# Patient Record
Sex: Female | Born: 1955 | Race: Black or African American | Hispanic: No | Smoking: Current every day smoker
Health system: Southern US, Community
[De-identification: ages and names within clinical notes are randomized; demographics above are authoritative.]

## PROBLEM LIST (undated history)

## (undated) DIAGNOSIS — K5909 Other constipation: Secondary | ICD-10-CM

## (undated) DIAGNOSIS — F419 Anxiety disorder, unspecified: Secondary | ICD-10-CM

## (undated) DIAGNOSIS — Z992 Dependence on renal dialysis: Secondary | ICD-10-CM

## (undated) DIAGNOSIS — E119 Type 2 diabetes mellitus without complications: Secondary | ICD-10-CM

## (undated) DIAGNOSIS — I739 Peripheral vascular disease, unspecified: Secondary | ICD-10-CM

## (undated) DIAGNOSIS — M1990 Unspecified osteoarthritis, unspecified site: Secondary | ICD-10-CM

## (undated) DIAGNOSIS — J449 Chronic obstructive pulmonary disease, unspecified: Secondary | ICD-10-CM

## (undated) DIAGNOSIS — Z952 Presence of prosthetic heart valve: Secondary | ICD-10-CM

## (undated) DIAGNOSIS — M549 Dorsalgia, unspecified: Secondary | ICD-10-CM

## (undated) DIAGNOSIS — Z9289 Personal history of other medical treatment: Secondary | ICD-10-CM

## (undated) DIAGNOSIS — J189 Pneumonia, unspecified organism: Secondary | ICD-10-CM

## (undated) DIAGNOSIS — F329 Major depressive disorder, single episode, unspecified: Secondary | ICD-10-CM

## (undated) DIAGNOSIS — I509 Heart failure, unspecified: Secondary | ICD-10-CM

## (undated) DIAGNOSIS — I219 Acute myocardial infarction, unspecified: Secondary | ICD-10-CM

## (undated) DIAGNOSIS — G43909 Migraine, unspecified, not intractable, without status migrainosus: Secondary | ICD-10-CM

## (undated) DIAGNOSIS — Z89512 Acquired absence of left leg below knee: Secondary | ICD-10-CM

## (undated) DIAGNOSIS — K219 Gastro-esophageal reflux disease without esophagitis: Secondary | ICD-10-CM

## (undated) DIAGNOSIS — Z720 Tobacco use: Secondary | ICD-10-CM

## (undated) DIAGNOSIS — I10 Essential (primary) hypertension: Secondary | ICD-10-CM

## (undated) DIAGNOSIS — I2510 Atherosclerotic heart disease of native coronary artery without angina pectoris: Secondary | ICD-10-CM

## (undated) DIAGNOSIS — N186 End stage renal disease: Secondary | ICD-10-CM

## (undated) DIAGNOSIS — B1920 Unspecified viral hepatitis C without hepatic coma: Secondary | ICD-10-CM

## (undated) DIAGNOSIS — Z944 Liver transplant status: Secondary | ICD-10-CM

## (undated) DIAGNOSIS — F32A Depression, unspecified: Secondary | ICD-10-CM

## (undated) DIAGNOSIS — G8929 Other chronic pain: Secondary | ICD-10-CM

## (undated) DIAGNOSIS — Z89511 Acquired absence of right leg below knee: Secondary | ICD-10-CM

## (undated) DIAGNOSIS — D649 Anemia, unspecified: Secondary | ICD-10-CM

## (undated) DIAGNOSIS — E039 Hypothyroidism, unspecified: Secondary | ICD-10-CM

## (undated) DIAGNOSIS — I471 Supraventricular tachycardia: Secondary | ICD-10-CM

## (undated) DIAGNOSIS — G709 Myoneural disorder, unspecified: Secondary | ICD-10-CM

## (undated) HISTORY — DX: Anemia, unspecified: D64.9

## (undated) HISTORY — DX: Depression, unspecified: F32.A

## (undated) HISTORY — DX: Other chronic pain: G89.29

## (undated) HISTORY — PX: SMALL INTESTINE SURGERY: SHX150

## (undated) HISTORY — PX: CYSTOSCOPY: SUR368

## (undated) HISTORY — PX: THROMBECTOMY: PRO61

## (undated) HISTORY — DX: Gastro-esophageal reflux disease without esophagitis: K21.9

## (undated) HISTORY — PX: TUBAL LIGATION: SHX77

## (undated) HISTORY — DX: Unspecified viral hepatitis C without hepatic coma: B19.20

## (undated) HISTORY — DX: Acute myocardial infarction, unspecified: I21.9

## (undated) HISTORY — DX: Major depressive disorder, single episode, unspecified: F32.9

## (undated) HISTORY — DX: Dorsalgia, unspecified: M54.9

## (undated) HISTORY — PX: OTHER SURGICAL HISTORY: SHX169

## (undated) HISTORY — DX: Tobacco use: Z72.0

## (undated) HISTORY — DX: Atherosclerotic heart disease of native coronary artery without angina pectoris: I25.10

## (undated) HISTORY — DX: Essential (primary) hypertension: I10

---

## 1991-02-11 HISTORY — PX: CHOLECYSTECTOMY: SHX55

## 2004-06-07 ENCOUNTER — Ambulatory Visit: Payer: Self-pay | Admitting: Endocrinology

## 2004-06-14 ENCOUNTER — Ambulatory Visit: Payer: Self-pay | Admitting: Endocrinology

## 2004-06-18 ENCOUNTER — Ambulatory Visit: Payer: Self-pay | Admitting: Endocrinology

## 2004-06-28 ENCOUNTER — Ambulatory Visit: Payer: Self-pay | Admitting: Internal Medicine

## 2004-11-13 ENCOUNTER — Ambulatory Visit: Payer: Self-pay | Admitting: Endocrinology

## 2005-05-29 ENCOUNTER — Ambulatory Visit: Payer: Self-pay | Admitting: Endocrinology

## 2005-09-25 ENCOUNTER — Ambulatory Visit: Payer: Self-pay | Admitting: Endocrinology

## 2005-12-30 ENCOUNTER — Ambulatory Visit: Payer: Self-pay | Admitting: Endocrinology

## 2006-02-19 ENCOUNTER — Ambulatory Visit: Payer: Self-pay | Admitting: Endocrinology

## 2006-02-19 LAB — CONVERTED CEMR LAB
Hgb A1c MFr Bld: 8.2 % — ABNORMAL HIGH (ref 4.6–6.0)
TSH: 42.28 microintl units/mL — ABNORMAL HIGH (ref 0.35–5.50)

## 2006-05-28 ENCOUNTER — Ambulatory Visit: Payer: Self-pay | Admitting: Endocrinology

## 2006-06-25 ENCOUNTER — Encounter: Payer: Self-pay | Admitting: Endocrinology

## 2006-06-25 DIAGNOSIS — D631 Anemia in chronic kidney disease: Secondary | ICD-10-CM

## 2006-06-25 DIAGNOSIS — N189 Chronic kidney disease, unspecified: Secondary | ICD-10-CM

## 2006-06-25 DIAGNOSIS — E1122 Type 2 diabetes mellitus with diabetic chronic kidney disease: Secondary | ICD-10-CM

## 2006-06-25 DIAGNOSIS — K219 Gastro-esophageal reflux disease without esophagitis: Secondary | ICD-10-CM

## 2006-06-25 DIAGNOSIS — I119 Hypertensive heart disease without heart failure: Secondary | ICD-10-CM

## 2006-06-25 DIAGNOSIS — E039 Hypothyroidism, unspecified: Secondary | ICD-10-CM

## 2006-06-26 ENCOUNTER — Ambulatory Visit: Payer: Self-pay | Admitting: Endocrinology

## 2006-07-30 ENCOUNTER — Ambulatory Visit: Payer: Self-pay | Admitting: Endocrinology

## 2006-09-07 ENCOUNTER — Encounter: Payer: Self-pay | Admitting: Endocrinology

## 2006-11-18 ENCOUNTER — Encounter: Payer: Self-pay | Admitting: Endocrinology

## 2006-11-18 ENCOUNTER — Ambulatory Visit: Payer: Self-pay | Admitting: Endocrinology

## 2007-08-16 ENCOUNTER — Ambulatory Visit: Payer: Self-pay | Admitting: Endocrinology

## 2007-09-16 ENCOUNTER — Ambulatory Visit: Payer: Self-pay | Admitting: Endocrinology

## 2007-09-21 ENCOUNTER — Telehealth (INDEPENDENT_AMBULATORY_CARE_PROVIDER_SITE_OTHER): Payer: Self-pay | Admitting: *Deleted

## 2007-10-28 ENCOUNTER — Ambulatory Visit: Payer: Self-pay | Admitting: Endocrinology

## 2007-12-02 ENCOUNTER — Ambulatory Visit: Payer: Self-pay | Admitting: Endocrinology

## 2007-12-23 ENCOUNTER — Ambulatory Visit: Payer: Self-pay | Admitting: Endocrinology

## 2008-02-01 ENCOUNTER — Encounter: Payer: Self-pay | Admitting: Internal Medicine

## 2008-02-11 HISTORY — PX: SPINAL GROWTH RODS: SHX2427

## 2008-03-02 ENCOUNTER — Telehealth: Payer: Self-pay | Admitting: Endocrinology

## 2008-03-08 ENCOUNTER — Ambulatory Visit: Payer: Self-pay | Admitting: Internal Medicine

## 2008-10-03 ENCOUNTER — Telehealth: Payer: Self-pay | Admitting: Endocrinology

## 2008-10-19 ENCOUNTER — Inpatient Hospital Stay (HOSPITAL_COMMUNITY): Admission: RE | Admit: 2008-10-19 | Discharge: 2008-10-24 | Payer: Self-pay | Admitting: Neurosurgery

## 2009-10-25 HISTORY — PX: LIVER TRANSPLANT: SHX410

## 2009-12-21 ENCOUNTER — Emergency Department (HOSPITAL_COMMUNITY): Admission: EM | Admit: 2009-12-21 | Discharge: 2009-12-22 | Payer: Self-pay | Admitting: Emergency Medicine

## 2010-02-25 ENCOUNTER — Ambulatory Visit: Admit: 2010-02-25 | Payer: Self-pay | Admitting: Surgery

## 2010-02-25 ENCOUNTER — Ambulatory Visit
Admission: RE | Admit: 2010-02-25 | Discharge: 2010-02-25 | Payer: Self-pay | Source: Home / Self Care | Attending: Surgery | Admitting: Surgery

## 2010-03-05 NOTE — Procedures (Unsigned)
CEPHALIC VEIN MAPPING  INDICATION:  Preop for AV fistula placement, per VVS standing order.  HISTORY: Acute renal failure.  EXAM: The right cephalic vein is compressible with diameter measurements ranging from 0.10 to 0.18 cm.  The right basilic vein is compressible with diameter measurements ranging from 0.24 to 0.27 cm.  The left cephalic vein is not compressible at the antecubital fossa level with diameter measurements ranging from 0.11 to 0.34 cm.  The left basilic vein is not compressible at the distal brachium and antecubital fossa levels with diameter measurements ranging from 0.27 to 0.31 cm.  See attached worksheet for all measurements.  IMPRESSION:  Bilateral cephalic and basilic vein measurements as described above.  ___________________________________________ V. Charlena Cross, MD  CH/MEDQ  D:  02/25/2010  T:  02/25/2010  Job:  [id number]

## 2010-03-13 ENCOUNTER — Other Ambulatory Visit (HOSPITAL_COMMUNITY): Payer: PRIVATE HEALTH INSURANCE

## 2010-03-15 ENCOUNTER — Ambulatory Visit (HOSPITAL_COMMUNITY)
Admission: RE | Admit: 2010-03-15 | Discharge: 2010-03-15 | Disposition: A | Discharge status: Home / Self Care | Payer: PRIVATE HEALTH INSURANCE | Attending: Surgery | Admitting: Surgery

## 2010-03-15 ENCOUNTER — Encounter (HOSPITAL_COMMUNITY): Payer: PRIVATE HEALTH INSURANCE

## 2010-03-15 DIAGNOSIS — N186 End stage renal disease: Secondary | ICD-10-CM

## 2010-03-15 DIAGNOSIS — N189 Chronic kidney disease, unspecified: Secondary | ICD-10-CM | POA: Insufficient documentation

## 2010-03-15 DIAGNOSIS — I120 Hypertensive chronic kidney disease with stage 5 chronic kidney disease or end stage renal disease: Secondary | ICD-10-CM

## 2010-03-15 LAB — POCT I-STAT 4, (NA,K, GLUC, HGB,HCT)
Glucose, Bld: 138 mg/dL — ABNORMAL HIGH (ref 70–99)
Potassium: 4.1 mEq/L (ref 3.5–5.1)

## 2010-03-24 NOTE — Op Note (Addendum)
[name], [name]                ACCOUNT NO.:  [id number]  MEDICAL RECORD NO.:  [id number]           PATIENT TYPE:  O  LOCATION:  SDSC                         FACILITY:  [facility]  PHYSICIAN:  Juleen China IV, MDDATE OF BIRTH:  1955-09-22  DATE OF PROCEDURE:  03/15/2010 DATE OF DISCHARGE:  03/15/2010                              OPERATIVE REPORT   PREOPERATIVE DIAGNOSIS:  Chronic renal insufficiency.  POSTOPERATIVE DIAGNOSIS:  Chronic renal insufficiency.  PROCEDURE PERFORMED:  Left forearm arteriovenous Gore-Tex graft.  SURGEON: 1. Durene Cal IV, MD  ANESTHESIA:  General.  FINDINGS:  Vein is lateral, 6 mm Gore-Tex graft, end-to-side venous anastomosis.  BLOOD LOSS:  Minimal.  COMPLICATIONS:  None.  PROCEDURE:  The patient was identified in the holding area and taken to room 6, placed supine on the table.  General LMA anesthesia was administered.  The patient was prepped and draped in usual fashion. Time-out was called.  Antibiotics given.  A transverse incision was made at the antecubital crease.  Cautery was used to divided the subcutaneous tissue.  The fascia was divided with cautery.  Sharp dissection was then used to isolate the brachial artery and the brachial and parabrachial veins.  Brachial artery was disease-free measuring approximately 2.5 mm. Similarly, the brachial vein was approximately 2.5 mm.  They were dissected out and encircled with vessel loops.  Next, a counter incision was made in the distal forearm.  A curved tunneler was used to create a subcutaneous tunnel through which a 6-mm stretch Gore-Tex graft was brought.  At this point in time, the patient was given systemic heparinization.  After heparin circulated, the artery was occluded with serrefine clamps.  An #11 blade was used to make an arteriotomy, which was extended longitudinally with Potts scissors.  The graft was beveled to fit the size of the arteriotomy and a running anastomosis was  created with 6-0 Prolene prior to completion.  The artery was flushed appropriately.  Clamps were released.  There was excellent pulsatile flow through the graft.  The graft was then flushed with heparin, saline, and reoccluded.  Next, the vein was occluded.  An #11 blade was used to make a venotomy, which was extended longitudinally with Potts scissors.  The graft was cut to the appropriate length and side anastomosis was created with 6-0 Prolene.  Prior to completion, the graft and vein were appropriately flushed.  Anastomosis was completed. There is a good thrill in the graft.  At this point in time, the patient had a monophasic radial signal and multiphasic ulnar signal, neither was changed significantly with graft compression.  The patient's heparin was reversed with protamine.  Once I was satisfied with hemostasis, the counter incision in the distal forearm was closed with 3- 0 Vicryl and Dermabond.  The antecubital incision was closed in two layers of 3-0 Vicryl.  Dermabond was placed on the wound.  The patient tolerated the procedure well without complications.     Jorge Ny, MD     VWB/MEDQ  D:  03/15/2010  T:  03/15/2010  Job:  [id number]  Electronically Signed by V.  Myra Gianotti IV MD on 03/24/2010 09:59:44 PM

## 2010-04-02 NOTE — Consult Note (Signed)
[name]                ACCOUNT NO.:  [id number]  MEDICAL RECORD NO.:  [id number]          PATIENT TYPE:  EMS  LOCATION:  MAJO                         FACILITY:  [facility]  PHYSICIAN:  Lucile Crater, MD         DATE OF BIRTH:  Jun 05, 1955  DATE OF CONSULTATION:  12/21/2009 DATE OF DISCHARGE:                                CONSULTATION   PRIMARY CARE PHYSICIAN:  [facility].  CHIEF COMPLAINT:  Nausea and vomiting.  HISTORY OF PRESENT ILLNESS:  The patient is a 55 year old female with a history of hepatitis C and cirrhosis of the liver, for which she underwent a liver transplant on 10/25/2009 at [facility].  The patient has been put on immunosuppressants post transplant.  She is on Prograf, Myfortic, and prednisone.  The patient has had problems with nausea and vomiting even before the transplant, but she reports acute worsening of the symptoms in the past 4 days.  She takes meals by mouth. She also has a feeding tube, and she gets tube feeds.  The patient states that she has not been eating anything in the past for 4 or 5 days because she throws up every time she ingests something.  She was recently admitted to [facility] with similar symptoms. She was found to have Clostridium difficile colitis, and she was treated with vancomycin.  The treatment was completed on 12/08/2009.  She denies having any chest pain but reports shortness of breath for the past week or so.  She denies having any fever or chills, but she reports diffuse abdominal pain.  The last bowel movement was earlier this afternoon. She had her last dialysis 1 day ago.  She claims to be compliant with all of her medications.  As a part of her initial workup, she had a CT scan of the abdomen and pelvis.  This visualized the lower parts of the lungs, and this revealed a moderate right pleural effusion with collapse or consolidation of the right lower lobe.  She also had tiny gas locules in the  subcutaneous at the cranial margin of the incision.  This was presumed to be abnormal, and they could not rule out an underlying infection.  REVIEW OF SYSTEMS:  A complete Review of Systems was done which included general, head, eyes, ears, nose, throat, cardiovascular, respiratory, GI, GU, endocrine, musculoskeletal, skin, and psychiatric -- all within normal except as mentioned in the History of Present Illness.  PAST MEDICAL HISTORY: 1. Hepatitis C. 2. Cirrhosis and liver failure secondary to #1. 3. Status post liver transplant on 10/25/2009 at [facility] [place]. 4. End-stage renal disease on hemodialysis. 5. Major depressive disorder. 6. Gastroesophageal reflux disease. 7. Hypothyroidism. 8. Hypertension. 9. Clostridium difficile colitis. 10.Diabetes mellitus.  ALLERGIES:  None.  CURRENT MEDICATIONS: 1. Prograf 8 mg b.i.d. 2. Myfortic 180 mg, two capsules p.o. b.i.d. 3. Prednisone 5 mg p.o. q. daily. 4. Bactrim SS 400/80, one tablet every Monday, Wednesday, and Friday;     last dose 04/25/2010. 5. Valganciclovir 450 mg, one tablet on Thursday and Saturday after  dialysis; last dose 01/24/2010. 6. Aspirin Entocort 81 mg once a day. 7. Nexium 40 mg p.o. q.p.m. 8. Oxycodone 5 mg, one to two tablets p.o. q.4h. p.r.n. 9. Colace 100 mg, one to two capsules daily as needed p.r.n.     constipation. 10.Senna 8.6 mg, two tablets by mouth once daily p.r.n. constipation. 11.Zoloft 100 mg p.o. q.p.m. 12.Lopressor 50 mg, two tablets p.o. b.i.d. 13.Norvasc 10 mg p.o. q.p.m. 14.Hydralazine 100 mg, 1-1/2 tablets p.o. b.i.d. 15.Zofran 4 mg, one tablet on the tongue q.4h. as needed for nausea,     vomiting. 16.Phenergan 12.5 mg, one tablet q.a.c. t.i.d. p.r.n. nausea,     vomiting. 17.Synthroid 200 mcg, one tablet p.o. q.a.m. before breakfast. 18.Lantus 10 units subcutaneously q.p.m. 19.NovoLog sliding scale before meals. 20.Simethicone 80 mg, one  tablet q.8h. as needed. 21.Tums 500 mg, one tablet by mouth q.6h. as needed for heartburn. 22.Rena-Vite one tablet p.o. q.a.m. 23.Folic acid 1 mg, one tablet p.o. q.a.m.  SOCIAL HISTORY:  There is no history of alcohol or illicit drugs.  There is a history of tobacco abuse.  FAMILY HISTORY:  Noncontributory.  PHYSICAL EXAMINATION:  VITALS:  T-max 98.7, pulse rate 80, blood pressure 177/84, respiratory 18.  O2 sat is 100% on 2 liters. GENERAL APPEARANCE:  Not in acute distress; alert and oriented x3, afebrile. HEENT:  Normocephalic, atraumatic.  The pupils are equal, reactive to light and accommodation.  The extraocular muscles are intact.  The mucous membranes are moist. NECK:  Supple.  No JVD, lymphadenopathy, or carotid bruit. CVS:  Regular rhythm.  The rate is normal.  No murmurs, rubs, or gallops. LUNGS:  Clear to auscultation.  Decreased breath sounds in the right middle lobe and bases.  No crackles or wheezing. ABDOMEN:  Soft.  Diffuse tenderness.  Staples are in place.  The wound is healthy.  No discharge is seen.  Bowel sounds are normoactive. EXTREMITIES:  No clubbing or cyanosis.  There is 1+ edema bilaterally. NEUROLOGIC EXAM:  Grossly nonfocal.  LABS AND STUDIES: 1. CT of the abdomen and pelvis without contrast, dated 12/21/2009:     a.     Status post liver transplant.  Study limited by renal      insufficiency and inability to give intravenous contrast material,      but the liver has normal features.There is no evidence of      biliary ductal dilatation.     b.     A moderate right pleural effusion with      collapse/consolidation of the right lower lobe.     c.     One of the two central lines visible in the superior vena      cava has its tip positioned just into the azygos wing.     d.     Skin staples in place over a chevron incision in the      anterior abdominal wall.  There are some tiny gas locules in the      subcutaneous fat at the cranial margin of the  incision.  Gas would      not be expected at this location after about 10 to 14 days post      surgery.  Although there is no focal fluid collection at the      incision to suggest an abscess, underlying infection cannot be      excluded.  Diffuse body wall edema. 2. Lipase 15 units per liter. 3. Total protein 6, albumin 2.5, ALT 21,  AST 28, alkaline phosphatase     460, total bilirubin 1.5. 4. Sodium 136, potassium 2.8, chloride 96, bicarb 32, glucose 143, BUN     9, creatinine 3.9, calcium 8.8. 5. WBC 5,800, hemoglobin 9.1, hematocrit 28.7, platelet count 122,000;     a normal differential.  ASSESSMENT AND PLAN: 1. Nausea and vomiting:  The patient is less than 2 months post     transplant.  She is on Prograf for immunosuppression.  This could     be a side effect of the Prograf.  Also, she has diffuse abdominal     pain.  She does not have any evidence of a small-bowel obstruction     on imaging.  There is a concern for infection around the sutures.     She does not have any signs of an active abdomen at this time.  I     have spoken with the attending for the Surgical team at [facility], Dr. Shelda Altes.  I have     discussed with him the condition, and he also thinks it is     appropriate for the patient to be transferred to [facility]     for a higher level of care.  We will arrange for this. 2. Right-sided pneumonia:  This should be considered as healthcare-     associated pneumonia.  We recommend that the patient be started on     antibiotics, covering for MRSA and Pseudomonas as well. 3. Hepatitis C, status post liver transplant. 4. Hypertension:  We recommend continuing the home medications. 5. Diabetes:  Continue the home medications. 6. The patient's condition warrants a higher level of care at a     transplant center.  I have spoken to the attending on the Surgery     team at [facility].  He has accepted the patient  for     transfer.  We highly appreciate the input and cooperation.  We will     be making arrangements for the patient to be transferred to [facility].     Lucile Crater, MD     TA/MEDQ  D:  12/21/2009  T:  12/22/2009  Job:  [id number]  Electronically Signed by Lucile Crater MD on 04/02/2010 03:43:31 PM

## 2010-04-05 ENCOUNTER — Ambulatory Visit (INDEPENDENT_AMBULATORY_CARE_PROVIDER_SITE_OTHER): Payer: PRIVATE HEALTH INSURANCE

## 2010-04-05 DIAGNOSIS — N186 End stage renal disease: Secondary | ICD-10-CM

## 2010-04-09 NOTE — Assessment & Plan Note (Signed)
OFFICE VISIT  [name], [name] DOB:  Jun 14, 1955                                       04/05/2010 CHART#:[id number]  The patient presents today for followup of placement of left forearm AV Gore-Tex graft.  This was placed on Friday, February 3.  She has done well since procedure.  She states she has occasional tingling in her fingers but otherwise denies numbness, hand pain, decreased temperature, sensation and strength in her hand.  She states that she has been dialyzing well through the left-sided Diatek catheter.  She is without complaints as well.  PHYSICAL EXAM:  Vital signs:  Blood pressure 105/64 right arm sitting, O2 saturation 99% on room air, heart rate 67.  General:  She is well- nourished, in no acute distress.  Extremities:  The bilateral upper extremities are warm and well-perfused.  There is a 2+ radial and ulnar pulse in the right hand and a 2+ ulnar pulse in the left hand.  Motor and sensation are intact in the left hand.  There is a good thrill and bruit in the left forearm AV Gore-Tex graft.  There is minimal edema present surrounding the graft.  The incisions are well-healed with no drainage.  There is no evidence of erythema.  ASSESSMENT:  End-stage renal disease on hemodialysis with a Diatek catheter and a left forearm AV Gore-Tex graft.  PLAN:  The patient is doing well.  They will be able to start sticking the graft in approximately 1 week which will be 4 weeks total post procedure.  They will continue to use the Diatek catheter until the AV Gore-Tex graft has been used on several occasions without complication. The patient will follow up p.r.n. and the nephrologist will contact the office when they would like to have the Diatek catheter removed.  The patient knows to call with any questions, issues or problems in the interim.  Pecola Leisure, [place]  Fransisco Hertz, MD Electronically Signed  AY/MEDQ  D:  04/05/2010  T:   04/05/2010  Job:  [id number]

## 2010-04-23 LAB — CBC
Hemoglobin: 9.1 g/dL — ABNORMAL LOW (ref 12.0–15.0)
MCH: 30.3 pg (ref 26.0–34.0)
MCV: 95.7 fL (ref 78.0–100.0)
RBC: 3 MIL/uL — ABNORMAL LOW (ref 3.87–5.11)

## 2010-04-23 LAB — CULTURE, BLOOD (ROUTINE X 2): Culture: NO GROWTH

## 2010-04-23 LAB — DIFFERENTIAL
Eosinophils Absolute: 0 10*3/uL (ref 0.0–0.7)
Eosinophils Relative: 1 % (ref 0–5)
Lymphs Abs: 0.6 10*3/uL — ABNORMAL LOW (ref 0.7–4.0)
Monocytes Relative: 18 % — ABNORMAL HIGH (ref 3–12)

## 2010-04-23 LAB — BASIC METABOLIC PANEL
CO2: 32 mEq/L (ref 19–32)
Chloride: 96 mEq/L (ref 96–112)
GFR calc Af Amer: 14 mL/min — ABNORMAL LOW (ref >60)
Sodium: 136 mEq/L (ref 135–145)

## 2010-04-23 LAB — LIPASE, BLOOD: Lipase: 15 U/L (ref 11–59)

## 2010-04-23 LAB — HEPATIC FUNCTION PANEL
Albumin: 2.5 g/dL — ABNORMAL LOW (ref 3.5–5.2)
Alkaline Phosphatase: 462 U/L — ABNORMAL HIGH (ref 39–117)
Indirect Bilirubin: 0.7 mg/dL (ref 0.3–0.9)
Total Bilirubin: 1.5 mg/dL — ABNORMAL HIGH (ref 0.3–1.2)

## 2010-04-23 LAB — LACTIC ACID, PLASMA: Lactic Acid, Venous: 0.6 mmol/L (ref 0.5–2.2)

## 2010-05-17 LAB — BASIC METABOLIC PANEL
BUN: 11 mg/dL (ref 6–23)
Chloride: 103 mEq/L (ref 96–112)
Chloride: 98 mEq/L (ref 96–112)
Creatinine, Ser: 0.76 mg/dL (ref 0.4–1.2)
Creatinine, Ser: 1.03 mg/dL (ref 0.4–1.2)
GFR calc Af Amer: >60 mL/min (ref >60)
Glucose, Bld: 354 mg/dL — ABNORMAL HIGH (ref 70–99)

## 2010-05-17 LAB — CBC
MCHC: 33.9 g/dL (ref 30.0–36.0)
MCV: 95.7 fL (ref 78.0–100.0)
MCV: 96.7 fL (ref 78.0–100.0)
Platelets: 58 10*3/uL — ABNORMAL LOW (ref 150–400)
RBC: 3.56 MIL/uL — ABNORMAL LOW (ref 3.87–5.11)
RDW: 13.9 % (ref 11.5–15.5)
WBC: 16.7 10*3/uL — ABNORMAL HIGH (ref 4.0–10.5)
WBC: 4.7 10*3/uL (ref 4.0–10.5)

## 2010-05-17 LAB — TYPE AND SCREEN: ABO/RH(D): A POS

## 2010-05-17 LAB — GLUCOSE, CAPILLARY
Glucose-Capillary: 119 mg/dL — ABNORMAL HIGH (ref 70–99)
Glucose-Capillary: 167 mg/dL — ABNORMAL HIGH (ref 70–99)
Glucose-Capillary: 190 mg/dL — ABNORMAL HIGH (ref 70–99)
Glucose-Capillary: 195 mg/dL — ABNORMAL HIGH (ref 70–99)
Glucose-Capillary: 217 mg/dL — ABNORMAL HIGH (ref 70–99)
Glucose-Capillary: 238 mg/dL — ABNORMAL HIGH (ref 70–99)
Glucose-Capillary: 283 mg/dL — ABNORMAL HIGH (ref 70–99)
Glucose-Capillary: 300 mg/dL — ABNORMAL HIGH (ref 70–99)
Glucose-Capillary: 348 mg/dL — ABNORMAL HIGH (ref 70–99)
Glucose-Capillary: 348 mg/dL — ABNORMAL HIGH (ref 70–99)
Glucose-Capillary: 69 mg/dL — ABNORMAL LOW (ref 70–99)

## 2010-05-17 LAB — HEPATIC FUNCTION PANEL
Bilirubin, Direct: 0.3 mg/dL (ref 0.0–0.3)
Indirect Bilirubin: 0.4 mg/dL (ref 0.3–0.9)
Total Protein: 7.7 g/dL (ref 6.0–8.3)

## 2010-05-17 LAB — POCT I-STAT 4, (NA,K, GLUC, HGB,HCT)
Glucose, Bld: 53 mg/dL — ABNORMAL LOW (ref 70–99)
Glucose, Bld: <20 mg/dL — CL (ref 70–99)
HCT: 12 % — ABNORMAL LOW (ref 36.0–46.0)
HCT: 37 % (ref 36.0–46.0)
Hemoglobin: 12.6 g/dL (ref 12.0–15.0)
Hemoglobin: 4.1 g/dL — CL (ref 12.0–15.0)
Potassium: 3.4 mEq/L — ABNORMAL LOW (ref 3.5–5.1)
Potassium: <2 mEq/L — CL (ref 3.5–5.1)
Sodium: 143 mEq/L (ref 135–145)
Sodium: 157 mEq/L — ABNORMAL HIGH (ref 135–145)

## 2010-05-17 LAB — PREPARE PLATELETS

## 2010-05-17 LAB — PROTIME-INR
INR: 1.2 (ref 0.00–1.49)
Prothrombin Time: 14.7 seconds (ref 11.6–15.2)

## 2010-05-17 LAB — APTT: aPTT: 31 seconds (ref 24–37)

## 2010-06-25 NOTE — Assessment & Plan Note (Signed)
OFFICE VISIT   [name], [name]  DOB:  May 13, 1955                                       02/25/2010  CHART#:[id number]   REASON FOR VISIT:  Dialysis access.   HISTORY:  This is a 55 year old female seen at the request of Dr. Hyman Hopes  for evaluation of dialysis access.  The patient is right-handed.  She  suffers from hypertension and diabetes which are medically managed.  She  recently underwent liver transplantation at [facility] in 2011.  This was  secondary to hepatitis C, which she contracted during a blood  transfusion during operation.  She developed renal failure thereafter  and is currently dialyzing through a catheter.   REVIEW OF SYSTEMS:  GENERAL:  Positive for weight loss and loss of  appetite.  VASCULAR:  Positive for pain  in legs on walking.  GI:  Positive for reflux.  HEME:  Positive for anemia.  MUSCULOSKELETAL:  Positive for arthritis, joint, knee or muscle pain.  PSYCHIATRIC:  Positive for anxiety.   All other review of systems negative.   PAST MEDICAL HISTORY:  Hepatitis C, diabetes, hypertension, liver  transplantation secondary to cirrhosis, hypothyroidism.   PAST SURGICAL HISTORY:  Liver transplant, cholecystectomy, kidney  stones, exploratory laparotomy for bowel obstruction and back surgery.   SOCIAL HISTORY:  She is single with 2 children.  She is disabled.  Does  not currently smoke.  Has a history of smoking.  Does not drink.   FAMILY HISTORY:  Negative for renal or cardiac disease.   ALLERGIES ARE:  None.   MEDICATIONS:  Please see medical record.   PHYSICAL EXAMINATION:  Heart rate 70, blood pressure 156/79 regular rate  12.  General:  She is well-appearing in no distress.  HEENT:  Within  normal limits.  Lungs:  Clear bilaterally.  Cardiovascular:  She has a  palpable left brachial and radial pulse.  Abdomen:  Soft.  MUSCULOSKELETAL:  Without major deformity.  NEURO:  No focal deficits.  SKIN:  Without rash.   DIAGNOSTICS:   Vein mapping was performed today. The cephalic and basilic  vein are occluded on the left.  The basilic vein on the right is thick  walled and very small in caliber.  There is a marginal right basilic  vein.   ASSESSMENT/PLAN:  End-stage renal disease needing access.   PLAN:  Based on the patient's vein mapping studies and the fact that she  is right-handed, I think her best option is to proceed with a left  forearm AV Gore-Tex graft.  I discussed risk and benefits of that  operation with the patient.  We discussed risks of infection, the risks  of steal syndrome and need for future interventions.  All of her  questions were answered.   They have scheduled the operation for Friday, February 3.     Jorge Ny, MD  Electronically Signed   VWB/MEDQ  D:  02/25/2010  T:  02/26/2010  Job:  [id number]   cc:   Garnetta Buddy, M.D.

## 2010-06-25 NOTE — Consult Note (Signed)
[facility]                          ENDOCRINOLOGY CONSULTATION   NAME:[name], [name] SINGLETON                       MRN:          [id number]  DATE:06/26/2006                            DOB:          1955/11/26    REASON FOR VISIT:  Follow up diabetes.   HISTORY OF PRESENT ILLNESS:  This is a 55 year old woman who continues  to take a widely varying dosage two to three times a day.  Her glucoses  range from 150 to over 400 with no pattern.   PAST MEDICAL HISTORY:  1. Cirrhosis due to hepatitis C.  2. Chronic anemia.  3. GERD.  4. Hypothyroidism.  5. Hypertension.   REVIEW OF SYSTEMS:  Denies hypoglycemia.   PHYSICAL EXAMINATION:  VITAL SIGNS:  Blood pressure 140/83, heart rate  75, temperature 99.2, weight 209.  GENERAL:  In no distress.  She is alert and well-oriented.   IMPRESSION:  Due to her ongoing nonadherence with my advice regarding  diabetes, I conclude that she needs the simplest possible regimen rather  than what is metabolically the best.  Expectations of her glycemic  control will need to reflect this.   PLAN:  1. Change insulin to Lantus only 70 units a day.  2. Return in 6 weeks.  3. The patient is advised to not vary this glucose and come back      sooner if necessary.     Sean A. Everardo All, MD  Electronically Signed    SAE/MedQ  DD: 06/28/2006  DT: 06/29/2006  Job #: [id number]   cc:   [name], MD  Liver program [phone] [employer] [facility]

## 2010-06-28 NOTE — Consult Note (Signed)
[facility]                          ENDOCRINOLOGY CONSULTATION   NAME:[name]                       MRN:          [id number]  DATE:02/19/2006                            DOB:          [birth year]    REASON FOR VISIT:  Followup diabetes.   HISTORY OF PRESENT ILLNESS:  A 55 year old woman who currently takes  Lantus between 50 and 70 units a day. She states her glucoses are  variable, between 100s and 200s.   Regarding her hypothyroidism, she states that Dr. Shary Decamp treats this and  she is asking can I check her blood test and send it to Dr. Shary Decamp to  send __________  separate blood draw, since she is due for an A1c today  also.   PAST MEDICAL HISTORY:  1. Hypertension.  2. Hepatitis C complicated by cirrhosis.  3. Chronic anemia.  4. GERD.   SOCIAL HISTORY:  She works third shift and has stated that she feels  like she needs the simplest possible insulin regimen.   REVIEW OF SYSTEMS:  Denies hypoglycemia.   PHYSICAL EXAMINATION:  VITAL SIGNS:  Blood pressure is 144/86, heart  rate is 67, temperature is 97.0, the weight is 206.  GENERAL:  No distress.  NEUROLOGIC:  Alert and oriented, does not appear anxious nor depression.   LABORATORY DATA:  Hemoglobin A1c 8.2, TSH 42.   IMPRESSION:  1. Diabetes. It appears that she needs Lantus 70 units a day rather      than 50.  2. Hypothyroidism for which she sees Dr. Shary Decamp.   PLAN:  1. Change Lantus to 70 units daily. I have told her she can take this      35 twice a day if she wishes.  2. I will fax the TSH result to Dr. Shary Decamp for his review, since she      follows this there in [place].  3. She is advised to see an ophthalmologist for recheck on her      diabetes.  4. Return in 3 months.     Sean A. Everardo All, MD  Electronically Signed    SAE/MedQ  DD: 02/22/2006  DT: 02/23/2006  Job #: [id number]   cc:   [name], MD

## 2010-06-28 NOTE — Consult Note (Signed)
[facility]                          ENDOCRINOLOGY CONSULTATION   NAME:[name], [name]                       MRN:          [id number]  DATE:12/30/2005                            DOB:          [birth year]    REASON FOR VISIT:  Followup diabetes.   HISTORY OF PRESENT ILLNESS:  A 55 year old woman who states she is  unable to say for certain why her glucoses have gone up to the 300s  recently. She states she is feeling well in general, but is only able to  take insulin once a day.   PAST MEDICAL HISTORY:  Same as September 25, 2005.   REVIEW OF SYSTEMS:  She has a few pounds of weight gain since her last  visit, and she denies hypoglycemia.   PHYSICAL EXAMINATION:  VITAL SIGNS:  Blood pressure 149/84, heart rate  65, temperature 97.7, weight 201.  GENERAL:  No distress.  EXTREMITIES:  Feet normal color and temperature. There is no ulcer  present on her feet. Dorsalis pedis pulses are intact bilaterally. There  are calluses present on both feet. Sensation is intact to touch on her  feet.  NEUROLOGIC:  She does not appear anxious nor depressed.   IMPRESSION:  1. Uncertain why her glucoses have been elevated recently.  2. Therapy is limited by her insistence on once daily insulin.   PLAN:  1. Increase Lantus to 50 units a day.  2. Continue to increase p.r.n. elevated glucoses.  3. Return in about two months.     Sean A. Everardo All, MD  Electronically Signed    SAE/MedQ  DD: 01/02/2006  DT: 01/02/2006  Job #: [id number]   cc:   [name], MD

## 2010-06-28 NOTE — Consult Note (Signed)
[facility]                            ENDOCRINOLOGY CONSULTATION   NAME:[name], [name]                       MRN:          [id number]  DATE:09/25/2005                            DOB:          January 22, 1956    REASON FOR VISIT:  Follow up diabetes.   HISTORY OF PRESENT ILLNESS:  This is a 55 year old woman who currently takes  Lantus 30 units a day as a single agent because I found that other insulin  regimens appear to be too complex for her.  Her glucose ranges from 118-300.  She eats one meal just after noon and another meal at about 5 p.m.  However,  she describes her diet as poor.   PAST MEDICAL HISTORY:  1. Hepatitis C.  2. GERD.  3. Chronic anemia.  4. Hypertension.  5. Hypothyroidism.   SOCIAL HISTORY:  She works from 6 p.m. to 6 a.m. at a [employer].   REVIEW OF SYSTEMS:  Denies hypoglycemia.  She has about 10 pounds of weight  loss over the past few months.   PHYSICAL EXAMINATION:  VITAL SIGNS:  Blood pressure 127/75, heart rate 58,  temperature 98.4, weight 194.  GENERAL APPEARANCE:  No distress.  She does not appear anxious, nor  depressed, and gait is observed in the office to be normal.   LABORATORY DATA:  Hemoglobin A1C 7.2.   IMPRESSION:  Given that she needs a simple insulin regimen and that her  schedule is complex, I think this control is about as good as she is able to  achieve.   PLAN:  1. Same amount of Lantus.  2. Return in about three months.                                  Sean A. Everardo All, MD   SAE/MedQ  DD:  09/26/2005  DT:  09/26/2005  Job #:  [id number]   cc:   [name], M.D.

## 2010-10-03 ENCOUNTER — Ambulatory Visit (HOSPITAL_COMMUNITY)
Admission: AD | Admit: 2010-10-03 | Discharge: 2010-10-03 | Disposition: A | Discharge status: Home / Self Care | Payer: PRIVATE HEALTH INSURANCE | Source: Ambulatory Visit | Attending: Surgery | Admitting: Surgery

## 2010-10-03 DIAGNOSIS — T82898A Other specified complication of vascular prosthetic devices, implants and grafts, initial encounter: Secondary | ICD-10-CM

## 2010-10-03 DIAGNOSIS — K219 Gastro-esophageal reflux disease without esophagitis: Secondary | ICD-10-CM | POA: Insufficient documentation

## 2010-10-03 DIAGNOSIS — I120 Hypertensive chronic kidney disease with stage 5 chronic kidney disease or end stage renal disease: Secondary | ICD-10-CM

## 2010-10-03 DIAGNOSIS — N186 End stage renal disease: Secondary | ICD-10-CM

## 2010-10-03 DIAGNOSIS — J45909 Unspecified asthma, uncomplicated: Secondary | ICD-10-CM | POA: Insufficient documentation

## 2010-10-03 DIAGNOSIS — Z944 Liver transplant status: Secondary | ICD-10-CM | POA: Insufficient documentation

## 2010-10-03 DIAGNOSIS — Z01812 Encounter for preprocedural laboratory examination: Secondary | ICD-10-CM | POA: Insufficient documentation

## 2010-10-03 DIAGNOSIS — Z992 Dependence on renal dialysis: Secondary | ICD-10-CM | POA: Insufficient documentation

## 2010-10-03 DIAGNOSIS — Y832 Surgical operation with anastomosis, bypass or graft as the cause of abnormal reaction of the patient, or of later complication, without mention of misadventure at the time of the procedure: Secondary | ICD-10-CM | POA: Insufficient documentation

## 2010-10-03 DIAGNOSIS — B1920 Unspecified viral hepatitis C without hepatic coma: Secondary | ICD-10-CM | POA: Insufficient documentation

## 2010-10-03 DIAGNOSIS — F17200 Nicotine dependence, unspecified, uncomplicated: Secondary | ICD-10-CM | POA: Insufficient documentation

## 2010-10-03 HISTORY — PX: ARTERIOVENOUS GRAFT PLACEMENT: SUR1029

## 2010-10-03 LAB — POCT I-STAT 4, (NA,K, GLUC, HGB,HCT)
Glucose, Bld: 98 mg/dL (ref 70–99)
Hemoglobin: 12.6 g/dL (ref 12.0–15.0)
Potassium: 4.1 mEq/L (ref 3.5–5.1)
Sodium: 135 mEq/L (ref 135–145)

## 2010-10-03 LAB — SURGICAL PCR SCREEN: MRSA, PCR: NEGATIVE

## 2010-10-03 LAB — GLUCOSE, CAPILLARY: Glucose-Capillary: 93 mg/dL (ref 70–99)

## 2010-10-10 NOTE — Op Note (Signed)
NAMEPERMELIA, [name]                ACCOUNT NO.:  [id number]  MEDICAL RECORD NO.:  [id number]  LOCATION:  SDSC                         FACILITY:  [facility]  PHYSICIAN:  Juleen China IV, MDDATE OF BIRTH:  11/04/1955  DATE OF PROCEDURE:  10/03/2010 DATE OF DISCHARGE:  10/03/2010                              OPERATIVE REPORT   PREOPERATIVE DIAGNOSIS:  Thrombosed left forearm Gore-Tex graft.  POSTOPERATIVE DIAGNOSIS:  Thrombosed left forearm Gore-Tex graft.  PROCEDURE PERFORMED:  Thrombectomy and revision of left forearm graft.  TYPE OF ANESTHESIA:  General.  FINDINGS:  Occluded graft.  A interposition 6-mm Gore-Tex graft was placed and sewn end-to-side into the basilic vein.  INDICATIONS:  Ms. Rew is a 55 year old female with end-stage renal disease who dialyzes through a left forearm graft.  This has not been revised or undergone thrombolysis.  She has been complaining of some pain and swelling around her graft.  There was possible concern for infection.  She comes in today for her graft thrombectomy and evaluation.  PROCEDURE:  The patient was identified in the holding area, taken to room #9, placed supine on the table.  General anesthesia was administered.  The patient prepped and draped in usual fashion.  Time- out was called.  Antibiotics were given.  Ultrasound was used to scan the graft.  There was no perigraft fluid.  I also evaluated the basilic vein.  It was patent in the upper arm.  Its location was marked with an ink pen.  Next, the patient's previous incision was opened with #10 blade.  Cautery was used to divide the subcutaneous tissue.  Using a combination of cautery and Bovie dissection, the arterial and venous limb of the graft were dissected free, encircled with vessel loop. There was a fair amount of dense scar tissue around the anastomoses.  I then made a longitudinal incision in the upper arm over the basilic vein.  Cautery was used to divide the  subcutaneous tissue.  The fascia was opened with Metzenbaum scissors and the basilic vein was dissected out.  This was about a 2.5 to 3 mm vein.  A tunnel was then created between the 2 incisions.  The patient was given 5000 units of heparin. Next the venous limb of the graft was transected.  A cuff of the graft was oversewn on the vein using running 6-0 Prolene.  Next using 4 and 5 Fogarty catheters, thrombectomy was performed with the graft.  The arterial plug was removed.  There was a good pulsatile flow through the graft.  The graft was then flushed with heparinized saline and reoccluded.  An end-to-end anastomosis was then created between the old and new graft using a running CV6 Gore suture.  Once this was done the graft was brought through the tunnel.  The vein was occluded with Silastic loop and a serrefine clamp.  A #11 blade was used to make a venotomy which was extended longitudinally with Potts scissors.  The graft was cut to appropriate length and beveled to fit the size of the venotomy.  A running end-to-side anastomosis was created with 6-0 Prolene prior to completion, appropriate flush maneuvers were performed, and anastomosis was completed.  The patient had a good thrill within the graft after the procedure.  The patient's heparin was reversed with 50 mg of protamine.  The incision in the upper arm was closed with 2 layers of 3-0 Vicryl.  There was a fair amount of bleeding from just the scar tissue around the antecubital incision.  We held manual pressure for approximately 15 minutes with some resolution of the oozing.  I then used FloSeal to assist with the hemostasis.  I got to the point where I was satisfied with hemostasis.  The deep tissue was closed with 3-0 Vicryl.  Subcutaneous tissues were closed with 3-0 Vicryl. Dermabond was placed on the wound.  The patient was extubated and taken to recovery room in stable condition.     Jorge Ny,  MD     VWB/MEDQ  D:  10/03/2010  T:  10/04/2010  Job:  [id number]  Electronically Signed by Arelia Longest IV MD on 10/10/2010 12:14:17 AM

## 2010-11-19 ENCOUNTER — Encounter: Payer: Self-pay | Admitting: Thoracic Diseases

## 2010-11-20 ENCOUNTER — Encounter: Payer: Self-pay | Admitting: Thoracic Diseases

## 2010-11-20 ENCOUNTER — Ambulatory Visit (INDEPENDENT_AMBULATORY_CARE_PROVIDER_SITE_OTHER): Payer: PRIVATE HEALTH INSURANCE | Admitting: Thoracic Diseases

## 2010-11-20 VITALS — BP 180/106 | HR 76 | Resp 20 | Ht 64.0 in | Wt 138.6 lb

## 2010-11-20 DIAGNOSIS — R52 Pain, unspecified: Secondary | ICD-10-CM

## 2010-11-20 DIAGNOSIS — N186 End stage renal disease: Secondary | ICD-10-CM

## 2010-11-20 MED ORDER — PREGABALIN 25 MG PO CAPS: 25.0000 mg | ORAL_CAPSULE | Freq: Every day | ORAL | Status: DC

## 2010-11-20 NOTE — Patient Instructions (Signed)
Continue cool compresses for comfort, continue gabapentin

## 2010-11-20 NOTE — Progress Notes (Signed)
[facility] [place]  Postoperative Visit hemodialysis access   Date of Surgery: 10/03/10 Surgeon: [facility] Center: [facility] Dr. Caryn Section  HPI: [name] is a 55 y.o. female who is 6 weeks S/P thrombectomy/revision of left upper extremity Hemodialysis access. The patient complains of symptoms of tingling, sharp pain to 5th finger and  pain in the operative limb  At the area of the new tunneled graft at Ulnar nerve. She states the pain gets worse with Dialysis and then improves with ice pack and rest. The pain is sharp in nature and radiates to the medial aspect of the elbow and along Ulnar nerve distribution. Patient is here for follow-up and evaluation. She denies drainage, redness, fever or chills . She does complain of continued swelling in the area.  Pt is on hemodialysis through [facility].  Physical Examination  Filed Vitals:   11/20/10 1503  BP: 180/106  Pulse: 76  Resp: 20    WDWN female in NAD.  left upper extremity Incision is clean, dry, intact Skin color is normal   Hand grip is 5/5 and sensation in digits is intact; There is a good thrill and good bruit in the FA loop graft.  She has + tenderness over tunnel area which is soft and without erythema She is quite tender over ulnar nerve at the elbow. Palpation causes numbness at 5th finger The graft/fistula is easily palpable and there is no fluctuance around the graft  Assessment/Plan [name] is a 55 y.o. year old female who presents s/p thrombectomy/revision of  Hemodialysis access - left FA loop with jump graft to Basilic vein. There is no sign of infection in the new portion of the revision. She has Ulnar nerve irritation of the left elbow which is relieved with ice pack and rest, however, it does persist and is worse at night Follow-up in 3 weeks with Dr. Myra Gianotti If swelling persists may need duplex to look at graft. Tramadol 50mg  1 tab BID #20 RX to [facility]  Clinic MD: CS Edilia Bo,  MD

## 2010-11-25 ENCOUNTER — Other Ambulatory Visit: Payer: Self-pay

## 2010-12-19 ENCOUNTER — Encounter: Payer: Self-pay | Admitting: Vascular Surgery

## 2010-12-19 ENCOUNTER — Telehealth: Payer: Self-pay | Admitting: *Deleted

## 2010-12-19 ENCOUNTER — Ambulatory Visit (INDEPENDENT_AMBULATORY_CARE_PROVIDER_SITE_OTHER): Payer: PRIVATE HEALTH INSURANCE | Admitting: Vascular Surgery

## 2010-12-19 VITALS — BP 169/84 | HR 86 | Temp 98.9°F | Ht 64.0 in | Wt 137.0 lb

## 2010-12-19 DIAGNOSIS — T82898A Other specified complication of vascular prosthetic devices, implants and grafts, initial encounter: Secondary | ICD-10-CM

## 2010-12-19 DIAGNOSIS — N186 End stage renal disease: Secondary | ICD-10-CM

## 2010-12-19 NOTE — Progress Notes (Signed)
Patient is a 55 year old female referred today by Dr. Hyman Hopes for evaluation of a left forearm AV graft. The patient had thrombectomy and revision of her left arm AV graft in February and August of this year. Since her from ectomy was performed in August she has had diffuse edema of the left forearm. She was seen by our PA Della Goo approximately one month ago for similar symptoms. Since that time she has developed intermittent fever and chills approximately twice per week. The pain in her left forearm has not improved at all. The edema is slightly worse. She is currently on antibiotics with hemodialysis due to the fact that the graft may be infected. She dialyzes on Tuesday Thursday Saturday. Past Medical History  Diagnosis Date  . Chronic kidney disease   . Liver disease   . Hepatitis C   . Diabetes mellitus   . Hypertension   . Anemia   . Chronic back pain   . Diverticulosis   . CAD (coronary artery disease)   . Depression   . GERD (gastroesophageal reflux disease)   . Thyroid disease   . Obesity   . Leg pain    Past Surgical History  Procedure Date  . 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 Back surgery   . Cholecystectomy 1993  . Small intestine surgery   . Thrombectomy   . Arteriovenous graft placement 10/03/10    Left forearm AVG    History  Substance Use Topics  . Smoking status: Current Everyday Smoker -- 0.5 packs/day    Types: Cigarettes  . Smokeless tobacco: Not on file  . Alcohol Use: No   Physical exam: Filed Vitals:   12/19/10 1412  BP: 169/84  Pulse: 86  Temp: 98.9 F (37.2 C)  TempSrc: Oral  Height: 5\' 4"  (1.626 m)  Weight: 137 lb (62.143 kg)  SpO2: 100%   Left upper extremity: Diffuse edema throughout the left forearm tender to palpation. There is erythema in the left palm area. The hand is diffusely edematous. The hand and forearm are very tender to touch. There is no obvious abscess.  Assessment: Swelling left forearm AV graft. This should have improved  at this point since thrombectomy was several months ago. I agree with Dr. Hyman Hopes that most likely this represents graft infection. Regardless the graft has not really been usable because she has so much pain with dialysis. I believe the best option at this point would be to remove the graft.  Plan: I have scheduled the patient for removal of her left forearm AV graft on 12/23/2010. She'll continue her antibiotics with dialysis. We will also place a Diatek catheter at the time to remove her graft. She will require general anesthesia is an anomaly local is going to work in her left forearm.

## 2010-12-19 NOTE — Telephone Encounter (Signed)
Rosalita Chessman at [facility] called to say Dr Caryn Section wanted [name] seen today because access arm & especially hand is swollen and very tender. Added on to Dr CEFields schedule at 1330 today

## 2010-12-20 ENCOUNTER — Encounter: Payer: Self-pay | Admitting: Surgery

## 2010-12-23 ENCOUNTER — Inpatient Hospital Stay (HOSPITAL_COMMUNITY)
Admission: RE | Admit: 2010-12-23 | Discharge: 2010-12-25 | DRG: 264 | Disposition: A | Discharge status: Home / Self Care | Payer: Managed Care, Other (non HMO) | Source: Ambulatory Visit | Attending: Vascular Surgery | Admitting: Vascular Surgery

## 2010-12-23 ENCOUNTER — Encounter (HOSPITAL_COMMUNITY)
Admission: RE | Disposition: A | Discharge status: Home / Self Care | Payer: Self-pay | Source: Ambulatory Visit | Attending: Vascular Surgery

## 2010-12-23 ENCOUNTER — Ambulatory Visit (HOSPITAL_COMMUNITY): Payer: Managed Care, Other (non HMO)

## 2010-12-23 ENCOUNTER — Encounter (HOSPITAL_COMMUNITY): Payer: Self-pay | Admitting: Anesthesiology

## 2010-12-23 ENCOUNTER — Ambulatory Visit: Payer: PRIVATE HEALTH INSURANCE | Admitting: Surgery

## 2010-12-23 ENCOUNTER — Encounter (HOSPITAL_COMMUNITY): Payer: Self-pay | Admitting: *Deleted

## 2010-12-23 ENCOUNTER — Other Ambulatory Visit: Payer: Self-pay

## 2010-12-23 ENCOUNTER — Emergency Department (HOSPITAL_COMMUNITY)
Admission: EM | Admit: 2010-12-23 | Discharge: 2010-12-23 | Disposition: A | Discharge status: Home / Self Care | Payer: Managed Care, Other (non HMO) | Attending: Emergency Medicine | Admitting: Emergency Medicine

## 2010-12-23 ENCOUNTER — Ambulatory Visit (HOSPITAL_COMMUNITY): Payer: Managed Care, Other (non HMO) | Admitting: Anesthesiology

## 2010-12-23 DIAGNOSIS — N189 Chronic kidney disease, unspecified: Secondary | ICD-10-CM | POA: Insufficient documentation

## 2010-12-23 DIAGNOSIS — T82590A Other mechanical complication of surgically created arteriovenous fistula, initial encounter: Secondary | ICD-10-CM

## 2010-12-23 DIAGNOSIS — M79609 Pain in unspecified limb: Secondary | ICD-10-CM | POA: Insufficient documentation

## 2010-12-23 DIAGNOSIS — M549 Dorsalgia, unspecified: Secondary | ICD-10-CM | POA: Insufficient documentation

## 2010-12-23 DIAGNOSIS — Y841 Kidney dialysis as the cause of abnormal reaction of the patient, or of later complication, without mention of misadventure at the time of the procedure: Secondary | ICD-10-CM | POA: Insufficient documentation

## 2010-12-23 DIAGNOSIS — E039 Hypothyroidism, unspecified: Secondary | ICD-10-CM | POA: Diagnosis present

## 2010-12-23 DIAGNOSIS — Z794 Long term (current) use of insulin: Secondary | ICD-10-CM

## 2010-12-23 DIAGNOSIS — E119 Type 2 diabetes mellitus without complications: Secondary | ICD-10-CM | POA: Diagnosis present

## 2010-12-23 DIAGNOSIS — Z7982 Long term (current) use of aspirin: Secondary | ICD-10-CM | POA: Insufficient documentation

## 2010-12-23 DIAGNOSIS — I2510 Atherosclerotic heart disease of native coronary artery without angina pectoris: Secondary | ICD-10-CM | POA: Diagnosis present

## 2010-12-23 DIAGNOSIS — K219 Gastro-esophageal reflux disease without esophagitis: Secondary | ICD-10-CM | POA: Insufficient documentation

## 2010-12-23 DIAGNOSIS — F329 Major depressive disorder, single episode, unspecified: Secondary | ICD-10-CM | POA: Insufficient documentation

## 2010-12-23 DIAGNOSIS — T827XXA Infection and inflammatory reaction due to other cardiac and vascular devices, implants and grafts, initial encounter: Principal | ICD-10-CM | POA: Diagnosis present

## 2010-12-23 DIAGNOSIS — Y832 Surgical operation with anastomosis, bypass or graft as the cause of abnormal reaction of the patient, or of later complication, without mention of misadventure at the time of the procedure: Secondary | ICD-10-CM | POA: Diagnosis present

## 2010-12-23 DIAGNOSIS — N186 End stage renal disease: Secondary | ICD-10-CM | POA: Diagnosis present

## 2010-12-23 DIAGNOSIS — Z79899 Other long term (current) drug therapy: Secondary | ICD-10-CM | POA: Insufficient documentation

## 2010-12-23 DIAGNOSIS — F341 Dysthymic disorder: Secondary | ICD-10-CM | POA: Diagnosis present

## 2010-12-23 DIAGNOSIS — T82598A Other mechanical complication of other cardiac and vascular devices and implants, initial encounter: Secondary | ICD-10-CM | POA: Insufficient documentation

## 2010-12-23 DIAGNOSIS — R52 Pain, unspecified: Secondary | ICD-10-CM

## 2010-12-23 DIAGNOSIS — Z992 Dependence on renal dialysis: Secondary | ICD-10-CM

## 2010-12-23 DIAGNOSIS — I120 Hypertensive chronic kidney disease with stage 5 chronic kidney disease or end stage renal disease: Secondary | ICD-10-CM | POA: Diagnosis present

## 2010-12-23 DIAGNOSIS — F3289 Other specified depressive episodes: Secondary | ICD-10-CM | POA: Insufficient documentation

## 2010-12-23 DIAGNOSIS — G8929 Other chronic pain: Secondary | ICD-10-CM | POA: Insufficient documentation

## 2010-12-23 DIAGNOSIS — J45909 Unspecified asthma, uncomplicated: Secondary | ICD-10-CM | POA: Diagnosis present

## 2010-12-23 DIAGNOSIS — M7989 Other specified soft tissue disorders: Secondary | ICD-10-CM | POA: Insufficient documentation

## 2010-12-23 DIAGNOSIS — Z8619 Personal history of other infectious and parasitic diseases: Secondary | ICD-10-CM | POA: Insufficient documentation

## 2010-12-23 DIAGNOSIS — I129 Hypertensive chronic kidney disease with stage 1 through stage 4 chronic kidney disease, or unspecified chronic kidney disease: Secondary | ICD-10-CM | POA: Insufficient documentation

## 2010-12-23 DIAGNOSIS — Z944 Liver transplant status: Secondary | ICD-10-CM

## 2010-12-23 HISTORY — PX: INSERTION OF DIALYSIS CATHETER: SHX1324

## 2010-12-23 HISTORY — DX: Anxiety disorder, unspecified: F41.9

## 2010-12-23 HISTORY — DX: Hypothyroidism, unspecified: E03.9

## 2010-12-23 HISTORY — DX: Unspecified osteoarthritis, unspecified site: M19.90

## 2010-12-23 HISTORY — DX: Peripheral vascular disease, unspecified: I73.9

## 2010-12-23 HISTORY — PX: AVGG REMOVAL: SHX5153

## 2010-12-23 LAB — BASIC METABOLIC PANEL
BUN: 24 mg/dL — ABNORMAL HIGH (ref 6–23)
BUN: 27 mg/dL — ABNORMAL HIGH (ref 6–23)
Chloride: 94 mEq/L — ABNORMAL LOW (ref 96–112)
Chloride: 98 mEq/L (ref 96–112)
Creatinine, Ser: 6.7 mg/dL — ABNORMAL HIGH (ref 0.50–1.10)
GFR calc Af Amer: 7 mL/min — ABNORMAL LOW (ref >90)
Glucose, Bld: 176 mg/dL — ABNORMAL HIGH (ref 70–99)
Glucose, Bld: 131 mg/dL — ABNORMAL HIGH (ref 70–99)
Potassium: 3.7 mEq/L (ref 3.5–5.1)
Potassium: 3.9 mEq/L (ref 3.5–5.1)

## 2010-12-23 LAB — GLUCOSE, CAPILLARY
Glucose-Capillary: 122 mg/dL — ABNORMAL HIGH (ref 70–99)
Glucose-Capillary: 145 mg/dL — ABNORMAL HIGH (ref 70–99)

## 2010-12-23 LAB — CBC
HCT: 41.3 % (ref 36.0–46.0)
HCT: 34.1 % — ABNORMAL LOW (ref 36.0–46.0)
Hemoglobin: 12.8 g/dL (ref 12.0–15.0)
Hemoglobin: 10.8 g/dL — ABNORMAL LOW (ref 12.0–15.0)
MCH: 27.1 pg (ref 26.0–34.0)
MCH: 27.6 pg (ref 26.0–34.0)
MCHC: 31 g/dL (ref 30.0–36.0)
MCHC: 31.7 g/dL (ref 30.0–36.0)
RBC: 4.73 MIL/uL (ref 3.87–5.11)

## 2010-12-23 LAB — PROTIME-INR
INR: 0.87 (ref 0.00–1.49)
INR: 0.93 (ref 0.00–1.49)

## 2010-12-23 LAB — DIFFERENTIAL
Basophils Relative: 0 % (ref 0–1)
Lymphs Abs: 1.1 10*3/uL (ref 0.7–4.0)
Monocytes Absolute: 0.7 10*3/uL (ref 0.1–1.0)
Monocytes Relative: 10 % (ref 3–12)
Neutro Abs: 5 10*3/uL (ref 1.7–7.7)

## 2010-12-23 SURGERY — REMOVAL OF ARTERIOVENOUS GORETEX GRAFT (AVGG)
Anesthesia: General | Site: Neck | Laterality: Right | Wound class: Dirty or Infected

## 2010-12-23 MED ORDER — ONDANSETRON HCL 4 MG/2ML IJ SOLN
4.0000 mg | Freq: Once | INTRAMUSCULAR | Status: AC
  Administered 2010-12-23: 4 mg via INTRAVENOUS
  Filled 2010-12-23: qty 2

## 2010-12-23 MED ORDER — HYDROMORPHONE HCL PF 1 MG/ML IJ SOLN
1.0000 mg | Freq: Once | INTRAMUSCULAR | Status: AC
  Administered 2010-12-23: 1 mg via INTRAVENOUS
  Filled 2010-12-23: qty 1

## 2010-12-23 MED ORDER — GABAPENTIN 600 MG PO TABS
600.0000 mg | ORAL_TABLET | Freq: Every day | ORAL | Status: DC
  Administered 2010-12-23 – 2010-12-25 (×3): 600 mg via ORAL
  Filled 2010-12-23 (×3): qty 1

## 2010-12-23 MED ORDER — MORPHINE SULFATE 2 MG/ML IJ SOLN
2.0000 mg | INTRAMUSCULAR | Status: DC | PRN
  Administered 2010-12-24 (×3): 2 mg via INTRAVENOUS
  Filled 2010-12-23 (×2): qty 1

## 2010-12-23 MED ORDER — HYDRALAZINE HCL 20 MG/ML IJ SOLN
10.0000 mg | INTRAMUSCULAR | Status: DC | PRN
  Filled 2010-12-23: qty 0.5

## 2010-12-23 MED ORDER — HEPARIN SODIUM (PORCINE) 1000 UNIT/ML IJ SOLN
INTRAMUSCULAR | Status: DC | PRN
  Administered 2010-12-23: 4.6 mL

## 2010-12-23 MED ORDER — PROMETHAZINE HCL 25 MG/ML IJ SOLN: 6.2500 mg | INTRAMUSCULAR | Status: DC | PRN

## 2010-12-23 MED ORDER — CALCIUM CARBONATE ANTACID 500 MG PO CHEW
1.0000 | CHEWABLE_TABLET | Freq: Every day | ORAL | Status: DC
  Administered 2010-12-23 – 2010-12-25 (×3): 200 mg via ORAL
  Filled 2010-12-23 (×3): qty 1

## 2010-12-23 MED ORDER — SODIUM CHLORIDE 0.9 % IR SOLN
Status: DC | PRN
  Administered 2010-12-23: 1000 mL

## 2010-12-23 MED ORDER — BISACODYL 10 MG RE SUPP: 10.0000 mg | Freq: Every day | RECTAL | Status: DC | PRN

## 2010-12-23 MED ORDER — PREGABALIN 25 MG PO CAPS
25.0000 mg | ORAL_CAPSULE | Freq: Every day | ORAL | Status: DC
  Administered 2010-12-23 – 2010-12-24 (×2): 25 mg via ORAL
  Filled 2010-12-23 (×2): qty 1

## 2010-12-23 MED ORDER — POTASSIUM CHLORIDE CRYS ER 20 MEQ PO TBCR: 20.0000 meq | EXTENDED_RELEASE_TABLET | Freq: Once | ORAL | Status: AC | PRN

## 2010-12-23 MED ORDER — LABETALOL HCL 5 MG/ML IV SOLN
10.0000 mg | INTRAVENOUS | Status: DC | PRN
  Filled 2010-12-23: qty 4

## 2010-12-23 MED ORDER — ESMOLOL HCL 10 MG/ML IV SOLN
INTRAVENOUS | Status: DC | PRN
  Administered 2010-12-23: 5 mg via INTRAVENOUS

## 2010-12-23 MED ORDER — MAGNESIUM SULFATE 50 % IJ SOLN
2.0000 g | Freq: Once | INTRAVENOUS | Status: AC | PRN
  Filled 2010-12-23: qty 4

## 2010-12-23 MED ORDER — DOCUSATE SODIUM 100 MG PO CAPS
100.0000 mg | ORAL_CAPSULE | Freq: Two times a day (BID) | ORAL | Status: DC
  Administered 2010-12-24 – 2010-12-25 (×3): 100 mg via ORAL
  Filled 2010-12-23 (×4): qty 1

## 2010-12-23 MED ORDER — LISINOPRIL 5 MG PO TABS
5.0000 mg | ORAL_TABLET | Freq: Every day | ORAL | Status: DC
  Administered 2010-12-24 – 2010-12-25 (×2): 5 mg via ORAL
  Filled 2010-12-23 (×2): qty 1

## 2010-12-23 MED ORDER — OXYCODONE HCL 5 MG PO TABS
ORAL_TABLET | ORAL | Status: AC
  Administered 2010-12-23: 5 mg
  Filled 2010-12-23: qty 1

## 2010-12-23 MED ORDER — SENNA 8.6 MG PO TABS
2.0000 | ORAL_TABLET | Freq: Every day | ORAL | Status: DC
  Administered 2010-12-23 – 2010-12-25 (×3): 17.2 mg via ORAL
  Filled 2010-12-23 (×4): qty 2

## 2010-12-23 MED ORDER — DEXTROSE 5 % IV SOLN
1.5000 g | INTRAVENOUS | Status: DC | PRN
  Administered 2010-12-23: 1.5 g via INTRAVENOUS

## 2010-12-23 MED ORDER — TACROLIMUS 1 MG PO CAPS
6.0000 mg | ORAL_CAPSULE | Freq: Two times a day (BID) | ORAL | Status: DC
  Administered 2010-12-23 – 2010-12-25 (×3): 6 mg via ORAL
  Filled 2010-12-23 (×6): qty 6

## 2010-12-23 MED ORDER — PROPOFOL 10 MG/ML IV EMUL
INTRAVENOUS | Status: DC | PRN
  Administered 2010-12-23: 120 mg via INTRAVENOUS

## 2010-12-23 MED ORDER — DOCUSATE SODIUM 100 MG PO CAPS: 100.0000 mg | ORAL_CAPSULE | Freq: Every day | ORAL | Status: DC

## 2010-12-23 MED ORDER — PHENYLEPHRINE HCL 10 MG/ML IJ SOLN
INTRAMUSCULAR | Status: DC | PRN
  Administered 2010-12-23 (×3): 80 ug via INTRAVENOUS

## 2010-12-23 MED ORDER — LEVOTHYROXINE SODIUM 25 MCG PO TABS
25.0000 ug | ORAL_TABLET | Freq: Every day | ORAL | Status: DC
  Administered 2010-12-24 – 2010-12-25 (×2): 25 ug via ORAL
  Filled 2010-12-23 (×3): qty 1

## 2010-12-23 MED ORDER — ONDANSETRON HCL 4 MG/2ML IJ SOLN: 4.0000 mg | Freq: Four times a day (QID) | INTRAMUSCULAR | Status: DC | PRN

## 2010-12-23 MED ORDER — SODIUM CHLORIDE 0.9 % IR SOLN
Status: DC | PRN
  Administered 2010-12-23: 17:00:00

## 2010-12-23 MED ORDER — MEPERIDINE HCL 25 MG/ML IJ SOLN: 6.2500 mg | INTRAMUSCULAR | Status: DC | PRN

## 2010-12-23 MED ORDER — ACETAMINOPHEN 325 MG PO TABS
325.0000 mg | ORAL_TABLET | ORAL | Status: DC | PRN
  Administered 2010-12-23 – 2010-12-25 (×2): 650 mg via ORAL
  Filled 2010-12-23 (×2): qty 2

## 2010-12-23 MED ORDER — SERTRALINE HCL 100 MG PO TABS
100.0000 mg | ORAL_TABLET | Freq: Every day | ORAL | Status: DC
  Administered 2010-12-23 – 2010-12-25 (×3): 100 mg via ORAL
  Filled 2010-12-23 (×3): qty 1

## 2010-12-23 MED ORDER — OXYCODONE HCL 5 MG PO TABS
5.0000 mg | ORAL_TABLET | ORAL | Status: DC | PRN
  Administered 2010-12-24: 5 mg via ORAL
  Filled 2010-12-23: qty 1

## 2010-12-23 MED ORDER — ALBUTEROL SULFATE HFA 108 (90 BASE) MCG/ACT IN AERS
2.0000 | INHALATION_SPRAY | RESPIRATORY_TRACT | Status: DC | PRN
  Filled 2010-12-23: qty 6.7

## 2010-12-23 MED ORDER — FENTANYL CITRATE 0.05 MG/ML IJ SOLN
INTRAMUSCULAR | Status: DC | PRN
  Administered 2010-12-23: 50 ug via INTRAVENOUS
  Administered 2010-12-23: 100 ug via INTRAVENOUS
  Administered 2010-12-23: 50 ug via INTRAVENOUS

## 2010-12-23 MED ORDER — DEXTROSE 5 % IV SOLN
1.5000 g | Freq: Two times a day (BID) | INTRAVENOUS | Status: DC
  Administered 2010-12-23: 1.5 g via INTRAVENOUS
  Filled 2010-12-23 (×2): qty 1.5

## 2010-12-23 MED ORDER — FAMOTIDINE IN NACL 20-0.9 MG/50ML-% IV SOLN
20.0000 mg | Freq: Two times a day (BID) | INTRAVENOUS | Status: DC
  Administered 2010-12-24 – 2010-12-25 (×3): 20 mg via INTRAVENOUS
  Filled 2010-12-23 (×4): qty 50

## 2010-12-23 MED ORDER — DULOXETINE HCL 60 MG PO CPEP
60.0000 mg | ORAL_CAPSULE | Freq: Every day | ORAL | Status: DC
  Administered 2010-12-23 – 2010-12-25 (×3): 60 mg via ORAL
  Filled 2010-12-23 (×3): qty 1

## 2010-12-23 MED ORDER — METOPROLOL SUCCINATE ER 25 MG PO TB24
25.0000 mg | ORAL_TABLET | Freq: Two times a day (BID) | ORAL | Status: DC
  Administered 2010-12-23 – 2010-12-25 (×4): 25 mg via ORAL
  Filled 2010-12-23 (×5): qty 1

## 2010-12-23 MED ORDER — ASPIRIN EC 81 MG PO TBEC
81.0000 mg | DELAYED_RELEASE_TABLET | Freq: Every day | ORAL | Status: DC
  Administered 2010-12-23 – 2010-12-25 (×3): 81 mg via ORAL
  Filled 2010-12-23 (×3): qty 1

## 2010-12-23 MED ORDER — SODIUM CHLORIDE 0.9 % IV SOLN
INTRAVENOUS | Status: DC
  Administered 2010-12-23 – 2010-12-24 (×3): via INTRAVENOUS

## 2010-12-23 MED ORDER — SODIUM CHLORIDE 0.9 % IV SOLN: INTRAVENOUS | Status: DC

## 2010-12-23 MED ORDER — OXYCODONE HCL 5 MG PO TABS: 5.0000 mg | ORAL_TABLET | ORAL | Status: DC | PRN

## 2010-12-23 MED ORDER — DEXTROSE 5 % IV SOLN
1.5000 g | INTRAVENOUS | Status: DC
  Filled 2010-12-23: qty 1.5

## 2010-12-23 MED ORDER — MIDAZOLAM HCL 5 MG/5ML IJ SOLN
INTRAMUSCULAR | Status: DC | PRN
  Administered 2010-12-23: 1 mg via INTRAVENOUS

## 2010-12-23 MED ORDER — SODIUM CHLORIDE 0.9 % IV SOLN
INTRAVENOUS | Status: DC | PRN
  Administered 2010-12-23: 16:00:00 via INTRAVENOUS

## 2010-12-23 MED ORDER — ACETAMINOPHEN 650 MG RE SUPP: 325.0000 mg | RECTAL | Status: DC | PRN

## 2010-12-23 MED ORDER — MUPIROCIN 2 % EX OINT
TOPICAL_OINTMENT | Freq: Two times a day (BID) | CUTANEOUS | Status: DC
  Administered 2010-12-23: 09:00:00 via NASAL
  Filled 2010-12-23 (×8): qty 22

## 2010-12-23 MED ORDER — SODIUM CHLORIDE 0.9 % IV SOLN: 500.0000 mL | Freq: Once | INTRAVENOUS | Status: AC | PRN

## 2010-12-23 MED ORDER — MYCOPHENOLATE SODIUM 180 MG PO TBEC
360.0000 mg | DELAYED_RELEASE_TABLET | Freq: Two times a day (BID) | ORAL | Status: DC
  Administered 2010-12-23 – 2010-12-25 (×3): 360 mg via ORAL
  Filled 2010-12-23 (×5): qty 2

## 2010-12-23 SURGICAL SUPPLY — 60 items
BAG DECANTER FOR FLEXI CONT (MISCELLANEOUS) ×3 IMPLANT
BANDAGE ELASTIC 4 VELCRO ST LF (GAUZE/BANDAGES/DRESSINGS) ×3 IMPLANT
CANISTER SUCTION 2500CC (MISCELLANEOUS) ×3 IMPLANT
CATH CANNON HEMO 15F 50CM (CATHETERS) IMPLANT
CATH CANNON HEMO 15FR 19 (HEMODIALYSIS SUPPLIES) IMPLANT
CATH CANNON HEMO 15FR 23CM (HEMODIALYSIS SUPPLIES) ×3 IMPLANT
CATH CANNON HEMO 15FR 31CM (HEMODIALYSIS SUPPLIES) IMPLANT
CATH CANNON HEMO 15FR 32CM (HEMODIALYSIS SUPPLIES) IMPLANT
CHLORAPREP W/TINT 26ML (MISCELLANEOUS) ×3 IMPLANT
CLIP TI MEDIUM 6 (CLIP) ×3 IMPLANT
CLIP TI WIDE RED SMALL 6 (CLIP) ×3 IMPLANT
CLOTH BEACON ORANGE TIMEOUT ST (SAFETY) ×3 IMPLANT
COVER PROBE W GEL 5X96 (DRAPES) ×3 IMPLANT
COVER SURGICAL LIGHT HANDLE (MISCELLANEOUS) ×12 IMPLANT
DECANTER SPIKE VIAL GLASS SM (MISCELLANEOUS) IMPLANT
DERMABOND ADVANCED (GAUZE/BANDAGES/DRESSINGS) ×1
DERMABOND ADVANCED .7 DNX12 (GAUZE/BANDAGES/DRESSINGS) ×2 IMPLANT
DRAPE C-ARM 42X72 X-RAY (DRAPES) ×3 IMPLANT
DRAPE CHEST BREAST 15X10 FENES (DRAPES) ×3 IMPLANT
ELECT REM PT RETURN 9FT ADLT (ELECTROSURGICAL) ×3
ELECTRODE REM PT RTRN 9FT ADLT (ELECTROSURGICAL) ×2 IMPLANT
GAUZE KERLIX 2  STERILE LF (GAUZE/BANDAGES/DRESSINGS) ×3 IMPLANT
GAUZE SPONGE 2X2 8PLY STRL LF (GAUZE/BANDAGES/DRESSINGS) ×2 IMPLANT
GAUZE SPONGE 4X4 12PLY STRL LF (GAUZE/BANDAGES/DRESSINGS) ×3 IMPLANT
GAUZE SPONGE 4X4 16PLY XRAY LF (GAUZE/BANDAGES/DRESSINGS) ×6 IMPLANT
GEL ULTRASOUND 20GR AQUASONIC (MISCELLANEOUS) IMPLANT
GLOVE BIO SURGEON STRL SZ7.5 (GLOVE) ×6 IMPLANT
GLOVE BIOGEL PI IND STRL 6.5 (GLOVE) ×8 IMPLANT
GLOVE BIOGEL PI INDICATOR 6.5 (GLOVE) ×4
GLOVE EXAM NITRILE XS STR PU (GLOVE) ×3 IMPLANT
GLOVE ORTHOPEDIC STR SZ6.5 (GLOVE) ×9 IMPLANT
GOWN PREVENTION PLUS XLARGE (GOWN DISPOSABLE) ×6 IMPLANT
GOWN STRL NON-REIN LRG LVL3 (GOWN DISPOSABLE) ×6 IMPLANT
KIT BASIN OR (CUSTOM PROCEDURE TRAY) ×3 IMPLANT
KIT ROOM TURNOVER OR (KITS) ×3 IMPLANT
NEEDLE 18GX1X1/2 (RX/OR ONLY) (NEEDLE) ×3 IMPLANT
NEEDLE HYPO 25GX1X1/2 BEV (NEEDLE) IMPLANT
NS IRRIG 1000ML POUR BTL (IV SOLUTION) ×3 IMPLANT
PACK CV ACCESS (CUSTOM PROCEDURE TRAY) ×3 IMPLANT
PACK SURGICAL SETUP 50X90 (CUSTOM PROCEDURE TRAY) IMPLANT
PAD ARMBOARD 7.5X6 YLW CONV (MISCELLANEOUS) ×3 IMPLANT
SPONGE GAUZE 2X2 STER 10/PKG (GAUZE/BANDAGES/DRESSINGS) ×1
SPONGE GAUZE 4X4 12PLY (GAUZE/BANDAGES/DRESSINGS) ×3 IMPLANT
SPONGE SURGIFOAM ABS GEL 100 (HEMOSTASIS) IMPLANT
STAPLER VISISTAT 35W (STAPLE) ×3 IMPLANT
SUT ETHILON 3 0 PS 1 (SUTURE) ×3 IMPLANT
SUT PROLENE 6 0 CC (SUTURE) ×6 IMPLANT
SUT VIC AB 3-0 SH 27 (SUTURE) ×2
SUT VIC AB 3-0 SH 27X BRD (SUTURE) ×4 IMPLANT
SUT VICRYL 4-0 PS2 18IN ABS (SUTURE) ×12 IMPLANT
SWAB COLLECTION DEVICE MRSA (MISCELLANEOUS) ×3 IMPLANT
SYR 20CC LL (SYRINGE) ×6 IMPLANT
SYR 30ML LL (SYRINGE) IMPLANT
SYR 5ML LL (SYRINGE) ×6 IMPLANT
SYR CONTROL 10ML LL (SYRINGE) IMPLANT
SYRINGE 10CC LL (SYRINGE) ×3 IMPLANT
TOWEL OR 17X24 6PK STRL BLUE (TOWEL DISPOSABLE) ×6 IMPLANT
TOWEL OR 17X26 10 PK STRL BLUE (TOWEL DISPOSABLE) ×3 IMPLANT
UNDERPAD 30X30 INCONTINENT (UNDERPADS AND DIAPERS) ×3 IMPLANT
WATER STERILE IRR 1000ML POUR (IV SOLUTION) ×3 IMPLANT

## 2010-12-23 NOTE — Anesthesia Postprocedure Evaluation (Signed)
  Anesthesia Post-op Note  Patient: [name]  Procedure(s) Performed:  REMOVAL OF ARTERIOVENOUS GORETEX GRAFT (AVGG) - procedure started @1736 -1852; INSERTION OF DIALYSIS CATHETER - Right Internal Jugular 28cm dialysis catheter insertion procedure time 1701-1720   Patient Location: PACU  Anesthesia Type: General  Level of Consciousness: awake and alert   Airway and Oxygen Therapy: Patient Spontanous Breathing and Patient connected to nasal cannula oxygen  Post-op Pain: mild  Post-op Assessment: Post-op Vital signs reviewed and Patient's Cardiovascular Status Stable  Post-op Vital Signs: stable  Complications: No apparent anesthesia complications

## 2010-12-23 NOTE — Transfer of Care (Signed)
Immediate Anesthesia Transfer of Care Note  Patient: [name]  Procedure(s) Performed:  REMOVAL OF ARTERIOVENOUS GORETEX GRAFT (AVGG) - procedure started @1736 ; INSERTION OF DIALYSIS CATHETER - Right Internal Jugular 28cm dialysis catheter insertion procedure time 1701-1720   Patient Location: PACU  Anesthesia Type: General  Level of Consciousness: awake  Airway & Oxygen Therapy: Patient Spontanous Breathing and Patient connected to face mask oxygen  Post-op Assessment: Report given to PACU RN, Post -op Vital signs reviewed and stable and Patient moving all extremities  Post vital signs: stable  Complications: No apparent anesthesia complications

## 2010-12-23 NOTE — ED Provider Notes (Signed)
History     CSN: [id number] Arrival date & time: 12/23/2010  1:48 AM   First MD Initiated Contact with Patient 12/23/10 0415      Chief Complaint  Patient presents with  . Hand Pain    significant swelling (AVG in L FA)    (Consider location/radiation/quality/duration/timing/severity/associated sxs/prior treatment) Patient is a 55 y.o. female presenting with hand pain. The history is provided by the patient.  Hand Pain This is a recurrent problem. The current episode started more than 1 week ago. The problem occurs constantly. The problem has been gradually worsening. Pertinent negatives include no chest pain, no abdominal pain, no headaches and no shortness of breath. Exacerbated by: palpation. The symptoms are relieved by nothing. She has tried nothing for the symptoms. The treatment provided no relief.   Increasing left hand pain and swelling, has a dialysis graft on that side with chronic and worsening issues. Has seen Dr. Darrick Penna as scheduled to the operating room today. Patient was feeling worse last night with pain and swelling after dialysis she presents here. Pain is sharp in nature. Moderate to severe. No associated fevers or vomiting. Patient has been getting vancomycin with dialysis but not taking antibiotics otherwise. Past Medical History  Diagnosis Date  . Chronic kidney disease   . Liver disease   . Hepatitis C   . Diabetes mellitus   . Hypertension   . Anemia   . Chronic back pain   . Diverticulosis   . CAD (coronary artery disease)   . Depression   . GERD (gastroesophageal reflux disease)   . Thyroid disease   . Obesity   . Leg pain     Past Surgical History  Procedure Date  . 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 Back surgery   . Cholecystectomy 1993  . Small intestine surgery   . Thrombectomy   . Arteriovenous graft placement 10/03/10    Left forearm AVG    Family History  Problem Relation Age of Onset  . Cancer Mother   . Diabetes Mother   .  Hypertension Mother   . Stroke Mother   . Cancer Father     History  Substance Use Topics  . Smoking status: Current Everyday Smoker -- 0.5 packs/day    Types: Cigarettes  . Smokeless tobacco: Not on file  . Alcohol Use: No    OB History    Grav Para Term Preterm Abortions TAB SAB Ect Mult Living                  Review of Systems  Constitutional: Negative for fever and chills.  HENT: Negative for neck pain and neck stiffness.   Eyes: Negative for pain.  Respiratory: Negative for shortness of breath and wheezing.   Cardiovascular: Negative for chest pain and leg swelling.  Gastrointestinal: Negative for abdominal pain.  Genitourinary: Negative for dysuria.  Musculoskeletal: Negative for back pain and gait problem.  Skin: Negative for rash.  Neurological: Negative for headaches.  All other systems reviewed and are negative.    Allergies  Acetaminophen and Codeine  Home Medications   Current Outpatient Rx  Name Route Sig Dispense Refill  . ALBUTEROL 90 MCG/ACT IN AERS Inhalation Inhale 2 puffs into the lungs every 4 (four) hours as needed. For shortness of breath    . ASPIRIN EC 81 MG PO TBEC Oral Take 81 mg by mouth daily.      [name] CALCIUM CARBONATE ANTACID 500 MG PO CHEW Oral Chew 1 tablet by  mouth daily.     [name] DOCUSATE SODIUM 100 MG PO CAPS Oral Take 100 mg by mouth 2 (two) times daily.      . DULOXETINE HCL 60 MG PO CPEP Oral Take 60 mg by mouth daily.      [name] GABAPENTIN 600 MG PO TABS Oral Take 600 mg by mouth daily.      [name] SYNTHROID PO Oral Take 0.2 mg by mouth daily.      [name] LISINOPRIL PO Oral Take 20 mg by mouth at bedtime.     [name] METOPROLOL SUCCINATE 25 MG PO TB24 Oral Take 25 mg by mouth 2 (two) times daily.      [name] MYCOPHENOLATE SODIUM 180 MG PO TBEC Oral Take 360 mg by mouth 2 (two) times daily.     . OXYCODONE HCL 5 MG PO CAPS Oral Take 5 mg by mouth every 4 (four) hours as needed. For pain     . PREGABALIN 25 MG PO CAPS Oral Take 1 capsule (25 mg total) by  mouth daily at 8 pm. 30 capsule 0  . PHENERGAN PO Oral Take 12.5 mg by mouth every 6 (six) hours as needed. For nausea    . SENNOSIDES 8.6 MG PO TABS Oral Take 2 tablets by mouth daily.     . SERTRALINE HCL 25 MG PO TABS Oral Take 100 mg by mouth daily.     [name] TACROLIMUS 1 MG PO CAPS Oral Take 6 mg by mouth 2 (two) times daily.        BP 191/92  Pulse 92  Temp(Src) 98.5 F (36.9 C) (Oral)  Resp 25  SpO2 100%  Physical Exam  Constitutional: She is oriented to person, place, and time. She appears well-developed and well-nourished.  HENT:  Head: Normocephalic and atraumatic.  Eyes: Conjunctivae and EOM are normal. Pupils are equal, round, and reactive to light.  Neck: Trachea normal. Neck supple. No thyromegaly present.  Cardiovascular: Normal rate, regular rhythm, S1 normal, S2 normal and normal pulses.     No systolic murmur is present   No diastolic murmur is present  Pulses:      Radial pulses are 2+ on the right side, and 2+ on the left side.  Pulmonary/Chest: Effort normal and breath sounds normal. She has no wheezes. She has no rhonchi. She has no rales. She exhibits no tenderness.  Abdominal: Soft. Normal appearance and bowel sounds are normal. There is no tenderness. There is no CVA tenderness and negative Murphy's sign.  Musculoskeletal:       Left upper extremity: There is significant swelling from the forearm involving the hands distal to dialysis graft. There is mild increased warmth to touch and decreased range of motion secondary to pain and swelling.  Neurological: She is alert and oriented to person, place, and time. She has normal strength. No cranial nerve deficit or sensory deficit. GCS eye subscore is 4. GCS verbal subscore is 5. GCS motor subscore is 6.  Skin: Skin is warm and dry. No rash noted. She is not diaphoretic.  Psychiatric: Her speech is normal.       Cooperative and appropriate    ED Course  Procedures (including critical care time)  Labs Reviewed    CBC - Abnormal; Notable for the following:    Platelets 118 (*)    All other components within normal limits  BASIC METABOLIC PANEL - Abnormal; Notable for the following:    Chloride 94 (*)    Glucose, Bld 176 (*)  BUN 24 (*)    Creatinine, Ser 6.70 (*)    Calcium 10.7 (*)    GFR calc non Af Amer 6 (*)    GFR calc Af Amer 7 (*)    All other components within normal limits  DIFFERENTIAL  APTT  PROTIME-INR   Case discussed with Dr. Darrick Penna on call for vascular surgery. Heis expecting her to be presenting this morning to short stay surgery to have removal of her left arm AV graft. Pain control the ED labs reviewed as above. Patient sent to short stay today to the OR. Dr. Darrick Penna did notify the OR that patient is here and she is on the schedule.   MDM  Left forearm swelling with graft and schedule operative repair today patient presented here for increased pain and symptoms. She is stable to go to the OR.        Sunnie Nielsen, MD 12/23/10 [phone]

## 2010-12-23 NOTE — Anesthesia Preprocedure Evaluation (Addendum)
Anesthesia Evaluation  Patient identified by MRN, date of birth, ID band Patient awake    Reviewed: Allergy & Precautions, H&P , NPO status , Patient's Chart, lab work & pertinent test results, reviewed documented beta blocker date and time   History of Anesthesia Complications Negative for: history of anesthetic complications  Airway Mallampati: II  Neck ROM: Full    Dental  (+) Edentulous Upper and Edentulous Lower   Pulmonary shortness of breath, asthma ,  Last albuterol MDI use approx one month ago clear to auscultation        Cardiovascular hypertension, Pt. on medications + CAD Regular Normal    Neuro/Psych  Headaches, PSYCHIATRIC DISORDERS Anxiety Depression    GI/Hepatic (+) Hepatitis -, CLiver transplant, history of. 2011 for Hep C (contracted after blood transfusion 1983)   Endo/Other  Diabetes mellitus-, Insulin Dependent and Oral Hypoglycemic AgentsHypothyroidism   Renal/GU ESRF and DialysisRenal diseaseTues-Thur-Sat HD x one year. Last full HD was Thursday, partial HD on Sat.     Musculoskeletal   Abdominal   Peds  Hematology   Anesthesia Other Findings   Reproductive/Obstetrics                         Anesthesia Physical Anesthesia Plan  ASA: IV and Emergent  Anesthesia Plan: General   Post-op Pain Management:    Induction: Intravenous  Airway Management Planned: LMA  Additional Equipment:   Intra-op Plan:   Post-operative Plan: Extubation in OR  Informed Consent: I have reviewed the patients History and Physical, chart, labs and discussed the procedure including the risks, benefits and alternatives for the proposed anesthesia with the patient or authorized representative who has indicated his/her understanding and acceptance.     Plan Discussed with:   Anesthesia Plan Comments:         Anesthesia Quick Evaluation

## 2010-12-23 NOTE — ED Notes (Addendum)
L FA AVG declogged in October.  pain, swelling & problems ongoing since then. Significant swelling worsening over the weekend, + bruit now in triage.  HD T,TH, S, last used AVG Saturday, hand and FA appear tight d/t swelling. Palpable L radial pulse, painful to touch.

## 2010-12-23 NOTE — Progress Notes (Signed)
  The patient has been re-examined.  The patient's history and physical has been reviewed and is unchanged since 12/19/10.  There is no change in the plan of care.  Fabienne Bruns, MD

## 2010-12-23 NOTE — Op Note (Signed)
Procedure: Ultrasound-guided insertion of Diatek catheter, Removal left forearm AV graft  Preoperative diagnosis: End-stage renal disease, possible left forearm graft infection  Postoperative diagnosis: Same  Anesthesia: Local with IV sedation  Specimens: culture left arm av graft  Operative findings: 23 cm Diatek catheter right internal jugular vein  Operative details: After obtaining informed consent, the patient was taken to the operating room. The patient was placed in supine position on the operating room table. After adequate sedation the patient's entire neck and chest were prepped and draped in usual sterile fashion. The patient was placed in Trendelenburg position. Ultrasound was used to identify the patient's right internal jugular vein. This had normal compressibility and respiratory variation. Local anesthesia was infiltrated over the right jugular vein.  Using ultrasound guidance, the right internal jugular vein was successfully cannulated.  A 0.035 J-tipped guidewire was threaded into the right internal jugular vein and into the superior vena cava followed by the inferior vena cava under fluoroscopic guidance.   Next sequential 12 and 14 dilators were placed over the guidewire into the right atrium.  A 16 French dilator with a peel-away sheath was then placed over the guidewire into the right atrium.   The guidewire and dilator were removed. A 23 cm Diatek catheter was then placed through the peel away sheath into the right atrium.  The catheter was then tunneled subcutaneously, cut to length, and the hub attached. The catheter was noted to flush and draw easily. The catheter was inspected under fluoroscopy and found with its tip to be in the right atrium without any kinks throughout its course. The catheter was sutured to the skin with nylon sutures. The neck insertion site was closed with Vicryl stitch. The catheter was then loaded with concentrated Heparin solution. A dry sterile  dressing was applied.  Attention was then turned to the patient's left upper extremity. The entire left upper extremity is prepped and draped in usual sterile fashion. A transverse incision was made her pre-existing scar in the antecubital crease. The incision was carried into the subcutaneous tissues down to level the graft. The arterial and venous limbs of the graft were dissected free circumferentially. These were mobilized as completely as possible down to level of the arterial anastomosis. An additional longitudinal incision was made on the medial aspect of the left upper arm through a pre-existing scar and the remainder of the venous limb was dissected free circumferentially through this incision. This was dissected out with down to level the venous anastomosis the graft was ligated at this level and divided between silk ties. The graft was then fully mobilized up into the antecubital incision. The arterial limb the graft and dissected free circumferentially and doubly ligated proximally and then divided. I then proceeded to completely mobilize the graft with cautery. As required 2 additional incisions in the forearm and a transverse incision in the distal forearm to remove all the graft due to the fact that was fairly well incorporated. There was some edema fluid surrounding the graft. There was no frank pus. Cultures were taken from the graft. After the graft been totally removed leaving a cuff on the arterial and all wounds were thoroughly irrigated with normal saline solution. Subcutaneous tissues of the antecubital incision closed with a running 3-0 Vicryl suture. The skin of all incisions was closed staples.  The pt had a palpable radial pulse at the end of the case.l  The patient tolerated procedure well and there were no complications. Instrument sponge and needle counts  correct in the case. The patient was taken to the recovery room in stable condition. Chest x-ray will be obtained in the recovery  room.  Fabienne Bruns, MD [facility] [place] Office: [phone] Pager: [phone]

## 2010-12-23 NOTE — Preoperative (Signed)
Beta Blockers   Reason not to administer Beta Blockers:Hold  beta blocker due to Bradycardia (HR less than 50 bpm) 

## 2010-12-24 ENCOUNTER — Inpatient Hospital Stay (HOSPITAL_COMMUNITY): Payer: Managed Care, Other (non HMO)

## 2010-12-24 LAB — BASIC METABOLIC PANEL
BUN: 35 mg/dL — ABNORMAL HIGH (ref 6–23)
Chloride: 101 mEq/L (ref 96–112)
Creatinine, Ser: 8.93 mg/dL — ABNORMAL HIGH (ref 0.50–1.10)
GFR calc Af Amer: 5 mL/min — ABNORMAL LOW (ref >90)
Glucose, Bld: 95 mg/dL (ref 70–99)

## 2010-12-24 LAB — CBC
HCT: 30.6 % — ABNORMAL LOW (ref 36.0–46.0)
Hemoglobin: 9.5 g/dL — ABNORMAL LOW (ref 12.0–15.0)
MCV: 87.4 fL (ref 78.0–100.0)
RDW: 14.7 % (ref 11.5–15.5)
WBC: 4.5 10*3/uL (ref 4.0–10.5)

## 2010-12-24 MED ORDER — LIDOCAINE HCL (PF) 1 % IJ SOLN
5.0000 mL | INTRAMUSCULAR | Status: DC | PRN
  Filled 2010-12-24: qty 5

## 2010-12-24 MED ORDER — CALCIUM CARBONATE 1250 MG/5ML PO SUSP: 500.0000 mg | Freq: Four times a day (QID) | ORAL | Status: DC | PRN

## 2010-12-24 MED ORDER — SODIUM CHLORIDE 0.9 % IV SOLN: 100.0000 mL | INTRAVENOUS | Status: DC | PRN

## 2010-12-24 MED ORDER — VANCOMYCIN HCL 1000 MG IV SOLR
750.0000 mg | Freq: Once | INTRAVENOUS | Status: AC
  Administered 2010-12-24: 750 mg via INTRAVENOUS
  Filled 2010-12-24: qty 750

## 2010-12-24 MED ORDER — PARICALCITOL 5 MCG/ML IV SOLN
1.0000 ug | INTRAVENOUS | Status: DC
  Filled 2010-12-24: qty 0.2

## 2010-12-24 MED ORDER — TRAMADOL HCL 50 MG PO TABS
50.0000 mg | ORAL_TABLET | Freq: Two times a day (BID) | ORAL | Status: DC
  Administered 2010-12-24 – 2010-12-25 (×2): 50 mg via ORAL
  Filled 2010-12-24 (×3): qty 1

## 2010-12-24 MED ORDER — SORBITOL 70 % SOLN: 30.0000 mL | Status: DC | PRN

## 2010-12-24 MED ORDER — LIDOCAINE-PRILOCAINE 2.5-2.5 % EX CREA
1.0000 "application " | TOPICAL_CREAM | CUTANEOUS | Status: DC | PRN
  Filled 2010-12-24: qty 5

## 2010-12-24 MED ORDER — HEPARIN SODIUM (PORCINE) 1000 UNIT/ML DIALYSIS
1000.0000 [IU] | INTRAMUSCULAR | Status: DC | PRN
  Filled 2010-12-24: qty 1

## 2010-12-24 MED ORDER — PROMETHAZINE HCL 25 MG RE SUPP: 25.0000 mg | Freq: Two times a day (BID) | RECTAL | Status: DC | PRN

## 2010-12-24 MED ORDER — HYDROMORPHONE HCL 2 MG PO TABS
2.0000 mg | ORAL_TABLET | Freq: Four times a day (QID) | ORAL | Status: DC | PRN
  Administered 2010-12-24 – 2010-12-25 (×2): 2 mg via ORAL
  Filled 2010-12-24 (×2): qty 1

## 2010-12-24 MED ORDER — PROMETHAZINE HCL 25 MG PO TABS: 12.5000 mg | ORAL_TABLET | Freq: Four times a day (QID) | ORAL | Status: DC | PRN

## 2010-12-24 MED ORDER — ZOLPIDEM TARTRATE 5 MG PO TABS: 5.0000 mg | ORAL_TABLET | Freq: Every evening | ORAL | Status: DC | PRN

## 2010-12-24 MED ORDER — OXYCODONE HCL 5 MG PO TABS: 5.0000 mg | ORAL_TABLET | ORAL | Status: DC | PRN

## 2010-12-24 MED ORDER — NEPRO/CARBSTEADY PO LIQD: 237.0000 mL | ORAL | Status: DC | PRN

## 2010-12-24 MED ORDER — DOCUSATE SODIUM 283 MG RE ENEM: 1.0000 | ENEMA | RECTAL | Status: DC | PRN

## 2010-12-24 MED ORDER — PENTAFLUOROPROP-TETRAFLUOROETH EX AERO
1.0000 "application " | INHALATION_SPRAY | CUTANEOUS | Status: DC | PRN
  Filled 2010-12-24: qty 103.5

## 2010-12-24 MED ORDER — DARBEPOETIN ALFA-POLYSORBATE 60 MCG/0.3ML IJ SOLN
INTRAMUSCULAR | Status: AC
  Filled 2010-12-24: qty 0.3

## 2010-12-24 MED ORDER — HYDROXYZINE HCL 25 MG PO TABS: 25.0000 mg | ORAL_TABLET | Freq: Three times a day (TID) | ORAL | Status: DC | PRN

## 2010-12-24 MED ORDER — PARICALCITOL 5 MCG/ML IV SOLN
1.0000 ug | INTRAVENOUS | Status: DC
  Administered 2010-12-24: 1 ug via INTRAVENOUS
  Filled 2010-12-24 (×2): qty 0.2

## 2010-12-24 MED ORDER — CAMPHOR-MENTHOL 0.5-0.5 % EX LOTN
1.0000 "application " | TOPICAL_LOTION | Freq: Three times a day (TID) | CUTANEOUS | Status: DC | PRN
  Filled 2010-12-24: qty 222

## 2010-12-24 MED ORDER — DARBEPOETIN ALFA-POLYSORBATE 60 MCG/0.3ML IJ SOLN
60.0000 ug | INTRAMUSCULAR | Status: DC
  Administered 2010-12-24: 60 ug via INTRAVENOUS
  Filled 2010-12-24: qty 0.3

## 2010-12-24 MED ORDER — PARICALCITOL 5 MCG/ML IV SOLN
INTRAVENOUS | Status: AC
  Filled 2010-12-24: qty 1

## 2010-12-24 NOTE — Progress Notes (Signed)
Late enttry for 12/23/2010 2025 report received from PACU

## 2010-12-24 NOTE — Progress Notes (Signed)
Antibiotic- Renal Dose Adjustment  Pt with ESRD ordered Zinacef q12h x 2 doses post-op.   Appropriate dose for Zinacef in ESRD patient is q24h doses therefore chanaged Zinacef to x1 dose post-op  Junita Push, PharmD, BCPS

## 2010-12-24 NOTE — Progress Notes (Signed)
Pt seen on HD.  Pulled 3.3 l  Tolerated well.

## 2010-12-24 NOTE — Progress Notes (Signed)
Phone call to Dr. Arbie Cookey. Notified of BP's greater than 170 various times throughout the shift. Pt currently 150's systolic. No new orders. Dianah Field, RN 12/24/10 [phone]

## 2010-12-24 NOTE — Progress Notes (Signed)
  Pt. Still having pain in LUE. Dressing chnaged by Dr Darrick Penna today and wounds looked good.  Moderate swelling in  The LUE . Dressing does not appear too tight. Pt able to make a fist  Plan: will keep Pt. Overnight for pain control and to watch swelling Elevate left arm on pillows  will give toradol 50mg  BID and change pain meds DC IV pain meds Dr Darrick Penna aware  Marlowe Shores [facility] 12/24/2010 4:20 PM

## 2010-12-24 NOTE — Discharge Summary (Addendum)
[facility] Discharge Summary   Patient ID:  [name] MRN: [id number] DOB/AGE: 07/17/1955 55 y.o.  Admit date: 12/23/2010 Discharge date: 12/24/2010 Date of Surgery: 12/23/2010 Surgeon: Surgeon(s): [name] Kerns, MD  Admission Diagnosis: REMOVAL OF ARTERIOVENOUS GORETEX GRAFT (AVGG)  Discharge Diagnoses:  REMOVAL OF ARTERIOVENOUS GORETEX GRAFT (AVGG)  Secondary Diagnoses: Past Medical History  Diagnosis Date  . Chronic kidney disease   . Liver disease   . Hepatitis C   . Diabetes mellitus   . Hypertension   . Anemia   . Chronic back pain   . Diverticulosis   . CAD (coronary artery disease)   . Depression   . GERD (gastroesophageal reflux disease)   . Thyroid disease   . Obesity   . Leg pain   . Peripheral vascular disease hands and legs  . Anxiety   . Arthritis   . Headache   . Blood transfusion   . Hypothyroidism   . Asthma   . Shortness of breath     Procedures: Procedure(s): REMOVAL OF ARTERIOVENOUS GORETEX GRAFT (AVGG) INSERTION OF DIALYSIS CATHETER  Discharged Condition: good  HPI:  [name] Kerns, MD  Patient is a 55 year old female referred  by Dr. Hyman Hopes for evaluation of a left forearm AV graft. The patient had thrombectomy and revision of her left arm AV graft in February and August of this year. Since her thrombectomy was performed in August she has had diffuse edema of the left forearm. She was seen by our PA [name] approximately one month ago for similar symptoms. Since that time she has developed intermittent fever and chills approximately twice per week. The pain in her left forearm has not improved at all. The edema is slightly worse. She is currently on antibiotics with hemodialysis due to the fact that the graft may be infected. She dialyzes on Tuesday Thursday Saturday. She will be admitted for removal of AVGG.  Hospital Course:  [name] is a 55 y.o. female is S/P Left REMOVAL OF ARTERIOVENOUS  GORETEX GRAFT (AVGG) INSERTION OF DIALYSIS CATHETER Extubated: POD # 0 Post-op wounds healing well Pt. Ambulating, voiding and taking PO diet without difficulty. Pt pain controlled with PO pain meds. Labs as below Complications:none  Consults: Renal  Cultures show no Organisms, no growth thus far in Pt. With NL WBC    Significant Diagnostic Studies: CBC    Component Value Date/Time   WBC 4.5 12/24/2010 0500   RBC 3.50* 12/24/2010 0500   HGB 9.5* 12/24/2010 0500   HCT 30.6* 12/24/2010 0500   PLT 71* 12/24/2010 0500   MCV 87.4 12/24/2010 0500   MCH 27.1 12/24/2010 0500   MCHC 31.0 12/24/2010 0500   RDW 14.7 12/24/2010 0500   LYMPHSABS 1.1 12/23/2010 0304   MONOABS 0.7 12/23/2010 0304   EOSABS 0.1 12/23/2010 0304   BASOSABS 0.0 12/23/2010 0304    BMET    Component Value Date/Time   NA 140 12/24/2010 0500   K 4.3 12/24/2010 0500   CL 101 12/24/2010 0500   CO2 22 12/24/2010 0500   GLUCOSE 95 12/24/2010 0500   BUN 35* 12/24/2010 0500   CREATININE 8.93* 12/24/2010 0500   CALCIUM 9.3 12/24/2010 0500   GFRNONAA 4* 12/24/2010 0500   GFRAA 5* 12/24/2010 0500    COAG Lab Results  Component Value Date   INR 0.93 12/23/2010   INR 0.87 12/23/2010   INR 1.2 10/13/2008     Disposition:  Discharge to :Home Discharge Orders  Future Orders Please Complete By Expires   Resume previous diet      Driving Restrictions      Comments:   No driving for 1 weeks   Lifting restrictions      Comments:   No lifting for 2 weeks   Call MD for:  temperature >100.5      Call MD for:  redness, tenderness, or signs of infection (pain, swelling, bleeding, redness, odor or green/yellow discharge around incision site)      Call MD for:  severe or increased pain, loss or decreased feeling  in affected limb(s)      May shower       Remove dressing in 24 hours      may wash over wound with mild soap and water      Discharge wound care:      Comments:   Apply clean dressings as needed     Discharge patient      Comments:   Discharge pt to home after Hemodialysis      [name]  Home Medication Instructions [id number]:[id number]   Printed on:12/24/10 1143  Medication Information                    sertraline (ZOLOFT) 25 MG tablet Take 100 mg by mouth daily.            DULoxetine (CYMBALTA) 60 MG capsule Take 60 mg by mouth daily.             metoprolol succinate (TOPROL-XL) 25 MG 24 hr tablet Take 25 mg by mouth 2 (two) times daily.             albuterol (PROVENTIL,VENTOLIN) 90 MCG/ACT inhaler Inhale 2 puffs into the lungs every 4 (four) hours as needed. For shortness of breath           tacrolimus (PROGRAF) 1 MG capsule Take 6 mg by mouth 2 (two) times daily.             calcium carbonate (TUMS - DOSED IN MG ELEMENTAL CALCIUM) 500 MG chewable tablet Chew 1 tablet by mouth daily.            gabapentin (NEURONTIN) 600 MG tablet Take 600 mg by mouth daily.             Levothyroxine Sodium (SYNTHROID PO) Take 0.2 mg by mouth daily.             mycophenolate (MYFORTIC) 180 MG EC tablet Take 360 mg by mouth 2 (two) times daily.            senna (SENOKOT) 8.6 MG tablet Take 2 tablets by mouth daily.            docusate sodium (COLACE) 100 MG capsule Take 100 mg by mouth 2 (two) times daily.             aspirin EC 81 MG tablet Take 81 mg by mouth daily.             pregabalin (LYRICA) 25 MG capsule Take 1 capsule (25 mg total) by mouth daily at 8 pm.           oxycodone (OXY-IR) 5 MG capsule Take 5 mg by mouth every 4 (four) hours as needed. For pain            LISINOPRIL PO Take 20 mg by mouth at bedtime.            Promethazine HCl (PHENERGAN PO)  Take 12.5 mg by mouth every 6 (six) hours as needed. For nausea           oxyCODONE (OXY IR/ROXICODONE) 5 MG immediate release tablet Take 1 tablet (5 mg total) by mouth every 4 (four) hours as needed. May take up to 2 tabs every 4 hours if needed            Verbal and written Discharge instructions  given to the patient. Wound care per Discharge AVS  Signed: Marlowe Shores, [facility] 12/24/2010, 11:43 AM   Addendum Note to D/C [name] is a 55 y.o. female who is S/P Procedure(s): REMOVAL OF ARTERIOVENOUS GORETEX GRAFT (AVGG) INSERTION OF DIALYSIS CATHETER. The patient is stable and ready for discharge. Pain  Better with Diff. Pain meds  No changes to History, Physical Exam,  or D/C instructions DilaudidPO prn pain, Tramadol prn pain RX x 2 given  ROCZNIAK,REGINA J 12/25/2010 9:35 AM   The patient will follow-up with [place] in 2 weeks for staple removal  Fabienne Bruns, MD [facility] [place] Office: [phone] Pager: [phone]

## 2010-12-24 NOTE — Progress Notes (Signed)
Pt. Is s/p graft removal with placement on right I-J perm cath yesterday.  She has been on [place] empirically at the outpatient center. Since [facility] were drawn 11/5 x 2 were negative.  Wound C & S pending from yesterday. Continue Vanc and Elita Quick and will finish a 2 week course at the outpatient center.  hgb decreased today 9.5 due to ABLA from surgery. Will resume outpatient Epo and give Aranesp 60 today.  Sheffield Slider, PA-C [facility] Beeper [phone].

## 2010-12-25 LAB — GLUCOSE, CAPILLARY: Glucose-Capillary: 157 mg/dL — ABNORMAL HIGH (ref 70–99)

## 2010-12-25 MED ORDER — HYDROMORPHONE HCL 2 MG PO TABS: 2.0000 mg | ORAL_TABLET | Freq: Four times a day (QID) | ORAL | Status: AC | PRN

## 2010-12-25 MED ORDER — FAMOTIDINE 20 MG PO TABS: 20.0000 mg | ORAL_TABLET | Freq: Every day | ORAL | Status: DC

## 2010-12-25 MED ORDER — TRAMADOL HCL 50 MG PO TABS: ORAL_TABLET | ORAL | Status: DC

## 2010-12-25 NOTE — Progress Notes (Signed)
Inpatient Diabetes Program Recommendations  AACE/ADA: New Consensus Statement on Inpatient Glycemic Control (2009)  Target Ranges:  Prepandial:   less than 140 mg/dL      Peak postprandial:   less than 180 mg/dL (1-2 hours)      Critically ill patients:  140 - 180 mg/dL   Reason for Visit: Pt with history of Diabetes.       CBGs 11/13: 337/ 186 mg/dl  Inpatient Diabetes Program Recommendations Correction (SSI): Please start Novolog Sensitive correction scale tid ac + HS. HgbA1C: Check A1C to assess home glucose control.  Note:

## 2010-12-25 NOTE — Progress Notes (Signed)
Famotidine--IV to po and renal adjustment  Pt started on Famotidine 20 mg IV q12 hours on 11/12 after removal of AVG and insertion of Diatek cath. For ESRD, the recommended dosing is 20 mg q24h. The patient is also resumed on renal diet and tolerating orals medications. Will change from IV --> po per P&T protocol and renally adjust for ESRD.  [name], PharmD 12/25/2010 1:12 PM

## 2010-12-25 NOTE — Progress Notes (Signed)
  [facility] [place]  Subjective  - less pain, still some swelling of hand and forearm   Objective 146/80 78 98.2 F (36.8 C) (Oral) 15 95%  Intake/Output Summary (Last 24 hours) at 12/25/10 0936 Last data filed at 12/25/10 0100  Gross per 24 hour  Intake    930 ml  Output   3312 ml  Net  -2382 ml   Incisions clean and healing left arm  Culture still negative to date  Assessment/Planning: POD #2 removal left forearm AVgraft and Diatek  D/c home follow up final culture as outpt   Monique Hefty E 12/25/2010 9:36 AM --  Laboratory Lab Results:  Basename 12/24/10 0500 12/23/10 0842  WBC 4.5 5.4  HGB 9.5* 10.8*  HCT 30.6* 34.1*  PLT 71* 74*   BMET  Basename 12/24/10 0500 12/23/10 0842  NA 140 138  K 4.3 3.9  CL 101 98  CO2 22 28  GLUCOSE 95 131*  BUN 35* 27*  CREATININE 8.93* 7.60*  CALCIUM 9.3 9.7    COAG Lab Results  Component Value Date   INR 0.93 12/23/2010   INR 0.87 12/23/2010   INR 1.2 10/13/2008   No results found for this basename: PTT    Antibiotics Anti-infectives     Start     Dose/Rate Route Frequency Ordered Stop   12/24/10 1445   vancomycin (VANCOCIN) 750 mg in sodium chloride 0.9 % 150 mL IVPB        750 mg 150 mL/hr over 60 Minutes Intravenous  Once 12/24/10 1432 12/24/10 1601   12/23/10 2230   cefUROXime (ZINACEF) 1.5 g in dextrose 5 % 50 mL IVPB  Status:  Discontinued        1.5 g 100 mL/hr over 30 Minutes Intravenous Every 12 hours 12/23/10 2229 12/24/10 0144   12/23/10 0827   cefUROXime (ZINACEF) 1.5 g in dextrose 5 % 50 mL IVPB  Status:  Discontinued        1.5 g 100 mL/hr over 30 Minutes Intravenous 60 min pre-op 12/23/10 0827 12/23/10 2150

## 2010-12-26 LAB — WOUND CULTURE: Gram Stain: NONE SEEN

## 2010-12-27 ENCOUNTER — Encounter (HOSPITAL_COMMUNITY): Payer: Self-pay | Admitting: Vascular Surgery

## 2011-01-08 ENCOUNTER — Encounter: Payer: Self-pay | Admitting: Thoracic Diseases

## 2011-01-09 ENCOUNTER — Other Ambulatory Visit: Payer: Self-pay | Admitting: *Deleted

## 2011-01-09 ENCOUNTER — Ambulatory Visit (INDEPENDENT_AMBULATORY_CARE_PROVIDER_SITE_OTHER): Payer: Managed Care, Other (non HMO) | Admitting: Thoracic Diseases

## 2011-01-09 VITALS — BP 174/85 | HR 108 | Resp 18 | Ht 64.0 in | Wt 128.0 lb

## 2011-01-09 DIAGNOSIS — N186 End stage renal disease: Secondary | ICD-10-CM

## 2011-01-09 NOTE — Progress Notes (Signed)
[facility] [place]  Postoperative Visit hemodialysis access   Date of Surgery: 12/23/2010    Procedure  Laterality  Anesthesia    REMOVAL OF ARTERIOVENOUS GORETEX GRAFT (AVGG)  Left  General          INSERTION OF DIALYSIS CATHETER  Right  General    Right Internal Jugular 28cm dialysis catheter insertion       Surgeon: CE Fields, MD  HPI: [name] is a 55 y.o. female who is 2 weeks S/P removal of left upper extremity Hemodialysis access. The patient denies symptoms of tingling, weakness and denies pain in the operative limb.  Patient is here for post -op evaluation and staple removal and discussion regarding new HD access. Pt. Left arm is healing well. Pt denies fever or chills. Vein Mapping done last year shows small cephalic and basilic veins less than 3mm. Reviewed vein mapping with Dr. Fields - Rec. AVGG right arm  Pt is on hemodialysis through  right IJ catheter on t th sat.  Past Medical History  Diagnosis Date  . Chronic kidney disease   . Liver disease   . Hepatitis C   . Diabetes mellitus   . Hypertension   . Anemia   . Chronic back pain   . Diverticulosis   . CAD (coronary artery disease)   . Depression   . GERD (gastroesophageal reflux disease)   . Thyroid disease   . Obesity   . Leg pain   . Peripheral vascular disease hands and legs  . Anxiety   . Arthritis   . Headache   . Blood transfusion   . Hypothyroidism   . Asthma   . Shortness of breath    History  Substance Use Topics  . Smoking status: Current Everyday Smoker -- 0.5 packs/day    Types: Cigarettes  . Smokeless tobacco: Not on file  . Alcohol Use: No   Current Outpatient Prescriptions  Medication Sig Dispense Refill  . albuterol (PROVENTIL,VENTOLIN) 90 MCG/ACT inhaler Inhale 2 puffs into the lungs every 4 (four) hours as needed. For shortness of breath      . aspirin EC 81 MG tablet Take 81 mg by mouth daily.        . calcium carbonate (TUMS - DOSED IN MG  ELEMENTAL CALCIUM) 500 MG chewable tablet Chew 1 tablet by mouth daily.       . docusate sodium (COLACE) 100 MG capsule Take 100 mg by mouth 2 (two) times daily.        . DULoxetine (CYMBALTA) 60 MG capsule Take 60 mg by mouth daily.        . gabapentin (NEURONTIN) 600 MG tablet Take 600 mg by mouth daily.        . Levothyroxine Sodium (SYNTHROID PO) Take 0.2 mg by mouth daily.        . LISINOPRIL PO Take 20 mg by mouth at bedtime.       . metoprolol succinate (TOPROL-XL) 25 MG 24 hr tablet Take 25 mg by mouth 2 (two) times daily.        . mycophenolate (MYFORTIC) 180 MG EC tablet Take 360 mg by mouth 2 (two) times daily.       . pregabalin (LYRICA) 25 MG capsule Take 1 capsule (25 mg total) by mouth daily at 8 pm.  30 capsule  0  . Promethazine HCl (PHENERGAN PO) Take 12.5 mg by mouth every 6 (six) hours as needed. For nausea      .   senna (SENOKOT) 8.6 MG tablet Take 2 tablets by mouth daily.       . sertraline (ZOLOFT) 25 MG tablet Take 100 mg by mouth daily.       . tacrolimus (PROGRAF) 1 MG capsule Take 6 mg by mouth 2 (two) times daily.        . traMADol (ULTRAM) 50 MG tablet Take 1 tablet 2 x per day as needed for pain  30 tablet  0   ROS: reviewed from last admission and there are no changes  Physical Examination Filed Vitals:   01/09/11 1549  BP: 174/85  Pulse: 108  Resp: 18  Height: 5' 4" (1.626 m)  Weight: 128 lb (58.06 kg)   WDWN female in NAD. A&Ox 3 HEENT grossly WNL Gait normal Lungs Clear Heart RRR left upper extremity Incision is healed There is a small soft hematoma at antecubital space - may be a seroma Skin color is normal   Hand grip is 5/5 and sensation in digits is intact;  Staples were removed without difficulty   Assessment/Plan [name] is a 55 y.o. year old who is s/p removal of infected left arm Hemodialysis access. She is being dialyzed through a right IJ diatek catheter She will need a new AVGG in right upper extremity which we will  schedule  Clinic MD: CE Fields, MD    

## 2011-01-17 ENCOUNTER — Encounter (HOSPITAL_COMMUNITY): Payer: Self-pay | Admitting: Pharmacy Technician

## 2011-01-23 ENCOUNTER — Inpatient Hospital Stay (HOSPITAL_COMMUNITY)
Admission: RE | Admit: 2011-01-23 | Discharge: 2011-01-23 | Payer: Managed Care, Other (non HMO) | Source: Ambulatory Visit

## 2011-01-23 NOTE — Progress Notes (Signed)
T,TH,SAT hemodialysis at [facility]

## 2011-01-28 ENCOUNTER — Encounter (HOSPITAL_COMMUNITY): Payer: Self-pay | Admitting: *Deleted

## 2011-01-28 MED ORDER — DEXTROSE 5 % IV SOLN
1.5000 g | INTRAVENOUS | Status: AC
  Administered 2011-01-29: 1.5 g via INTRAVENOUS
  Filled 2011-01-28: qty 1.5

## 2011-01-28 MED ORDER — SODIUM CHLORIDE 0.9 % IV SOLN: INTRAVENOUS | Status: DC

## 2011-01-28 NOTE — Progress Notes (Signed)
Pt doesn't have a cardiologist;managed by medical MD,Dr.Greg Shary Decamp for HTN  Denies heart catherization,echo  Stress test done in the 90's

## 2011-01-28 NOTE — Progress Notes (Signed)
ekg and cxr in epic 

## 2011-01-28 NOTE — Progress Notes (Signed)
[facility] in [place] is where pt goes for dialysis;goes on T/TH/S

## 2011-01-29 ENCOUNTER — Ambulatory Visit (HOSPITAL_COMMUNITY)
Admission: RE | Admit: 2011-01-29 | Discharge: 2011-01-29 | Disposition: A | Discharge status: Home / Self Care | Payer: Managed Care, Other (non HMO) | Source: Ambulatory Visit | Attending: Vascular Surgery | Admitting: Vascular Surgery

## 2011-01-29 ENCOUNTER — Encounter (HOSPITAL_COMMUNITY)
Admission: RE | Disposition: A | Discharge status: Home / Self Care | Payer: Self-pay | Source: Ambulatory Visit | Attending: Vascular Surgery

## 2011-01-29 ENCOUNTER — Ambulatory Visit (HOSPITAL_COMMUNITY): Payer: Managed Care, Other (non HMO) | Admitting: Anesthesiology

## 2011-01-29 ENCOUNTER — Encounter (HOSPITAL_COMMUNITY): Payer: Self-pay | Admitting: Anesthesiology

## 2011-01-29 DIAGNOSIS — Y838 Other surgical procedures as the cause of abnormal reaction of the patient, or of later complication, without mention of misadventure at the time of the procedure: Secondary | ICD-10-CM | POA: Insufficient documentation

## 2011-01-29 DIAGNOSIS — J45909 Unspecified asthma, uncomplicated: Secondary | ICD-10-CM | POA: Insufficient documentation

## 2011-01-29 DIAGNOSIS — N186 End stage renal disease: Secondary | ICD-10-CM | POA: Insufficient documentation

## 2011-01-29 DIAGNOSIS — I120 Hypertensive chronic kidney disease with stage 5 chronic kidney disease or end stage renal disease: Secondary | ICD-10-CM | POA: Insufficient documentation

## 2011-01-29 DIAGNOSIS — F411 Generalized anxiety disorder: Secondary | ICD-10-CM | POA: Insufficient documentation

## 2011-01-29 DIAGNOSIS — E039 Hypothyroidism, unspecified: Secondary | ICD-10-CM | POA: Insufficient documentation

## 2011-01-29 DIAGNOSIS — F3289 Other specified depressive episodes: Secondary | ICD-10-CM | POA: Insufficient documentation

## 2011-01-29 DIAGNOSIS — T82898A Other specified complication of vascular prosthetic devices, implants and grafts, initial encounter: Secondary | ICD-10-CM | POA: Insufficient documentation

## 2011-01-29 DIAGNOSIS — R52 Pain, unspecified: Secondary | ICD-10-CM

## 2011-01-29 DIAGNOSIS — F329 Major depressive disorder, single episode, unspecified: Secondary | ICD-10-CM | POA: Insufficient documentation

## 2011-01-29 DIAGNOSIS — K219 Gastro-esophageal reflux disease without esophagitis: Secondary | ICD-10-CM | POA: Insufficient documentation

## 2011-01-29 DIAGNOSIS — R51 Headache: Secondary | ICD-10-CM | POA: Insufficient documentation

## 2011-01-29 DIAGNOSIS — E119 Type 2 diabetes mellitus without complications: Secondary | ICD-10-CM | POA: Insufficient documentation

## 2011-01-29 DIAGNOSIS — B1920 Unspecified viral hepatitis C without hepatic coma: Secondary | ICD-10-CM | POA: Insufficient documentation

## 2011-01-29 DIAGNOSIS — I2510 Atherosclerotic heart disease of native coronary artery without angina pectoris: Secondary | ICD-10-CM | POA: Insufficient documentation

## 2011-01-29 DIAGNOSIS — G458 Other transient cerebral ischemic attacks and related syndromes: Secondary | ICD-10-CM

## 2011-01-29 DIAGNOSIS — Y9210 Unspecified residential institution as the place of occurrence of the external cause: Secondary | ICD-10-CM | POA: Insufficient documentation

## 2011-01-29 HISTORY — DX: Other constipation: K59.09

## 2011-01-29 HISTORY — PX: AV FISTULA PLACEMENT: SHX1204

## 2011-01-29 LAB — GLUCOSE, CAPILLARY: Glucose-Capillary: 112 mg/dL — ABNORMAL HIGH (ref 70–99)

## 2011-01-29 LAB — POCT I-STAT 4, (NA,K, GLUC, HGB,HCT): Hemoglobin: 14.3 g/dL (ref 12.0–15.0)

## 2011-01-29 SURGERY — INSERTION OF ARTERIOVENOUS (AV) GORE-TEX GRAFT ARM
Anesthesia: Monitor Anesthesia Care | Site: Arm Upper | Laterality: Right | Wound class: Clean

## 2011-01-29 SURGERY — LIGATION OF ARTERIOVENOUS  FISTULA
Anesthesia: Monitor Anesthesia Care | Site: Arm Upper | Laterality: Right | Wound class: Clean

## 2011-01-29 MED ORDER — SODIUM CHLORIDE 0.9 % IR SOLN
Status: DC | PRN
  Administered 2011-01-29: 1000 mL

## 2011-01-29 MED ORDER — ONDANSETRON HCL 4 MG/2ML IJ SOLN: 4.0000 mg | Freq: Four times a day (QID) | INTRAMUSCULAR | Status: DC | PRN

## 2011-01-29 MED ORDER — MIDAZOLAM HCL 5 MG/5ML IJ SOLN
INTRAMUSCULAR | Status: DC | PRN
  Administered 2011-01-29 (×2): 1 mg via INTRAVENOUS

## 2011-01-29 MED ORDER — HYDRALAZINE HCL 20 MG/ML IJ SOLN
INTRAMUSCULAR | Status: DC | PRN
  Administered 2011-01-29 (×2): 5 mg via INTRAVENOUS

## 2011-01-29 MED ORDER — ONDANSETRON HCL 4 MG/2ML IJ SOLN
INTRAMUSCULAR | Status: DC | PRN
  Administered 2011-01-29: 4 mg via INTRAVENOUS

## 2011-01-29 MED ORDER — PROTAMINE SULFATE 10 MG/ML IV SOLN
INTRAVENOUS | Status: DC | PRN
  Administered 2011-01-29 (×2): 20 mg via INTRAVENOUS
  Administered 2011-01-29: 10 mg via INTRAVENOUS

## 2011-01-29 MED ORDER — MUPIROCIN 2 % EX OINT
TOPICAL_OINTMENT | CUTANEOUS | Status: AC
  Administered 2011-01-29: 1 via NASAL
  Filled 2011-01-29: qty 22

## 2011-01-29 MED ORDER — HYDROMORPHONE HCL PF 1 MG/ML IJ SOLN
0.2500 mg | INTRAMUSCULAR | Status: DC | PRN
  Administered 2011-01-29 (×2): 0.5 mg via INTRAVENOUS
  Administered 2011-01-29 (×2): 0.25 mg via INTRAVENOUS

## 2011-01-29 MED ORDER — MIDAZOLAM HCL 5 MG/5ML IJ SOLN
INTRAMUSCULAR | Status: DC | PRN
  Administered 2011-01-29: 1 mg via INTRAVENOUS

## 2011-01-29 MED ORDER — SODIUM CHLORIDE 0.9 % IV SOLN
INTRAVENOUS | Status: DC | PRN
  Administered 2011-01-29: 17:00:00 via INTRAVENOUS

## 2011-01-29 MED ORDER — SODIUM CHLORIDE 0.9 % IV SOLN
INTRAVENOUS | Status: DC | PRN
  Administered 2011-01-29: 08:00:00 via INTRAVENOUS

## 2011-01-29 MED ORDER — SODIUM CHLORIDE 0.9 % IR SOLN
Status: DC | PRN
  Administered 2011-01-29: 09:00:00

## 2011-01-29 MED ORDER — FENTANYL CITRATE 0.05 MG/ML IJ SOLN
INTRAMUSCULAR | Status: DC | PRN
  Administered 2011-01-29 (×4): 50 ug via INTRAVENOUS

## 2011-01-29 MED ORDER — LIDOCAINE HCL (PF) 1 % IJ SOLN
INTRAMUSCULAR | Status: DC | PRN
  Administered 2011-01-29: 30 mL

## 2011-01-29 MED ORDER — LIDOCAINE HCL (PF) 1 % IJ SOLN
INTRAMUSCULAR | Status: DC | PRN
  Administered 2011-01-29: 6 mL

## 2011-01-29 MED ORDER — PROPOFOL 10 MG/ML IV EMUL
INTRAVENOUS | Status: DC | PRN
  Administered 2011-01-29: 25 ug/kg/min via INTRAVENOUS

## 2011-01-29 MED ORDER — PROPOFOL 10 MG/ML IV EMUL
INTRAVENOUS | Status: DC | PRN
  Administered 2011-01-29: 75 ug/kg/min via INTRAVENOUS

## 2011-01-29 MED ORDER — FENTANYL CITRATE 0.05 MG/ML IJ SOLN
INTRAMUSCULAR | Status: DC | PRN
  Administered 2011-01-29: 50 ug via INTRAVENOUS

## 2011-01-29 MED ORDER — FENTANYL CITRATE 0.05 MG/ML IJ SOLN
25.0000 ug | INTRAMUSCULAR | Status: DC | PRN
  Administered 2011-01-29: 50 ug via INTRAVENOUS

## 2011-01-29 MED ORDER — HEPARIN SODIUM (PORCINE) 1000 UNIT/ML IJ SOLN
INTRAMUSCULAR | Status: DC | PRN
  Administered 2011-01-29: 5000 [IU] via INTRAVENOUS

## 2011-01-29 MED ORDER — TRAMADOL HCL 50 MG PO TABS: 50.0000 mg | ORAL_TABLET | Freq: Four times a day (QID) | ORAL | Status: AC | PRN

## 2011-01-29 MED ORDER — PROPOFOL 10 MG/ML IV EMUL
INTRAVENOUS | Status: DC | PRN
  Administered 2011-01-29: 6 mg via INTRAVENOUS

## 2011-01-29 SURGICAL SUPPLY — 43 items
CANISTER SUCTION 2500CC (MISCELLANEOUS) ×2 IMPLANT
CLIP TI MEDIUM 6 (CLIP) ×2 IMPLANT
CLIP TI WIDE RED SMALL 6 (CLIP) ×2 IMPLANT
CLOTH BEACON ORANGE TIMEOUT ST (SAFETY) ×2 IMPLANT
COVER SURGICAL LIGHT HANDLE (MISCELLANEOUS) ×4 IMPLANT
DECANTER SPIKE VIAL GLASS SM (MISCELLANEOUS) IMPLANT
DERMABOND ADHESIVE PROPEN (GAUZE/BANDAGES/DRESSINGS) ×1
DERMABOND ADVANCED (GAUZE/BANDAGES/DRESSINGS) ×1
DERMABOND ADVANCED .7 DNX12 (GAUZE/BANDAGES/DRESSINGS) ×1 IMPLANT
DERMABOND ADVANCED .7 DNX6 (GAUZE/BANDAGES/DRESSINGS) ×1 IMPLANT
ELECT REM PT RETURN 9FT ADLT (ELECTROSURGICAL) ×2
ELECTRODE REM PT RTRN 9FT ADLT (ELECTROSURGICAL) ×1 IMPLANT
GAUZE SPONGE 2X2 8PLY STRL LF (GAUZE/BANDAGES/DRESSINGS) ×1 IMPLANT
GEL ULTRASOUND 20GR AQUASONIC (MISCELLANEOUS) ×2 IMPLANT
GLOVE BIO SURGEON STRL SZ7.5 (GLOVE) ×2 IMPLANT
GLOVE BIOGEL PI IND STRL 6.5 (GLOVE) ×1 IMPLANT
GLOVE BIOGEL PI IND STRL 7.0 (GLOVE) ×1 IMPLANT
GLOVE BIOGEL PI IND STRL 7.5 (GLOVE) ×1 IMPLANT
GLOVE BIOGEL PI INDICATOR 6.5 (GLOVE) ×1
GLOVE BIOGEL PI INDICATOR 7.0 (GLOVE) ×1
GLOVE BIOGEL PI INDICATOR 7.5 (GLOVE) ×1
GLOVE SS BIOGEL STRL SZ 6.5 (GLOVE) ×1 IMPLANT
GLOVE SUPERSENSE BIOGEL SZ 6.5 (GLOVE) ×1
GOWN PREVENTION PLUS XLARGE (GOWN DISPOSABLE) ×2 IMPLANT
GOWN STRL NON-REIN LRG LVL3 (GOWN DISPOSABLE) ×2 IMPLANT
GRAFT GORETEX STRT 4-7X45 (Vascular Products) ×2 IMPLANT
KIT BASIN OR (CUSTOM PROCEDURE TRAY) ×2 IMPLANT
KIT ROOM TURNOVER OR (KITS) ×2 IMPLANT
LOOP VESSEL MINI RED (MISCELLANEOUS) ×2 IMPLANT
NS IRRIG 1000ML POUR BTL (IV SOLUTION) ×2 IMPLANT
PACK CV ACCESS (CUSTOM PROCEDURE TRAY) ×2 IMPLANT
PAD ARMBOARD 7.5X6 YLW CONV (MISCELLANEOUS) ×4 IMPLANT
SPONGE GAUZE 2X2 STER 10/PKG (GAUZE/BANDAGES/DRESSINGS) ×1
SPONGE SURGIFOAM ABS GEL 100 (HEMOSTASIS) IMPLANT
SUT PROLENE 6 0 CC (SUTURE) ×4 IMPLANT
SUT VIC AB 3-0 SH 27 (SUTURE) ×2
SUT VIC AB 3-0 SH 27X BRD (SUTURE) ×2 IMPLANT
SUT VICRYL 4-0 PS2 18IN ABS (SUTURE) ×4 IMPLANT
TAPE CLOTH SURG 4X10 WHT LF (GAUZE/BANDAGES/DRESSINGS) ×2 IMPLANT
TOWEL OR 17X24 6PK STRL BLUE (TOWEL DISPOSABLE) ×2 IMPLANT
TOWEL OR 17X26 10 PK STRL BLUE (TOWEL DISPOSABLE) ×2 IMPLANT
UNDERPAD 30X30 INCONTINENT (UNDERPADS AND DIAPERS) ×2 IMPLANT
WATER STERILE IRR 1000ML POUR (IV SOLUTION) ×2 IMPLANT

## 2011-01-29 SURGICAL SUPPLY — 32 items
CANISTER SUCTION 2500CC (MISCELLANEOUS) ×2 IMPLANT
CLOTH BEACON ORANGE TIMEOUT ST (SAFETY) ×2 IMPLANT
COVER SURGICAL LIGHT HANDLE (MISCELLANEOUS) ×4 IMPLANT
DERMABOND ADVANCED (GAUZE/BANDAGES/DRESSINGS) ×1
DERMABOND ADVANCED .7 DNX12 (GAUZE/BANDAGES/DRESSINGS) ×1 IMPLANT
ELECT REM PT RETURN 9FT ADLT (ELECTROSURGICAL) ×2
ELECTRODE REM PT RTRN 9FT ADLT (ELECTROSURGICAL) ×1 IMPLANT
GAUZE SPONGE 4X4 12PLY STRL LF (GAUZE/BANDAGES/DRESSINGS) ×2 IMPLANT
GEL ULTRASOUND 20GR AQUASONIC (MISCELLANEOUS) ×2 IMPLANT
GLOVE BIO SURGEON STRL SZ7.5 (GLOVE) ×4 IMPLANT
GLOVE BIOGEL PI IND STRL 6.5 (GLOVE) ×1 IMPLANT
GLOVE BIOGEL PI INDICATOR 6.5 (GLOVE) ×1
GLOVE ECLIPSE 6.5 STRL STRAW (GLOVE) ×4 IMPLANT
GOWN PREVENTION PLUS XLARGE (GOWN DISPOSABLE) ×2 IMPLANT
GOWN STRL NON-REIN LRG LVL3 (GOWN DISPOSABLE) ×2 IMPLANT
KIT BASIN OR (CUSTOM PROCEDURE TRAY) ×2 IMPLANT
KIT ROOM TURNOVER OR (KITS) ×2 IMPLANT
LOOP VESSEL MINI RED (MISCELLANEOUS) IMPLANT
NS IRRIG 1000ML POUR BTL (IV SOLUTION) ×2 IMPLANT
PACK CV ACCESS (CUSTOM PROCEDURE TRAY) ×2 IMPLANT
PAD ARMBOARD 7.5X6 YLW CONV (MISCELLANEOUS) ×4 IMPLANT
SPONGE SURGIFOAM ABS GEL 100 (HEMOSTASIS) IMPLANT
SUT PROLENE 6 0 CC (SUTURE) IMPLANT
SUT SILK 0 (SUTURE) IMPLANT
SUT VIC AB 3-0 SH 27 (SUTURE) ×1
SUT VIC AB 3-0 SH 27X BRD (SUTURE) ×1 IMPLANT
SUT VICRYL 4-0 PS2 18IN ABS (SUTURE) ×2 IMPLANT
TAPE CLOTH SURG 4X10 WHT LF (GAUZE/BANDAGES/DRESSINGS) ×2 IMPLANT
TOWEL OR 17X24 6PK STRL BLUE (TOWEL DISPOSABLE) ×2 IMPLANT
TOWEL OR 17X26 10 PK STRL BLUE (TOWEL DISPOSABLE) ×2 IMPLANT
UNDERPAD 30X30 INCONTINENT (UNDERPADS AND DIAPERS) ×2 IMPLANT
WATER STERILE IRR 1000ML POUR (IV SOLUTION) ×2 IMPLANT

## 2011-01-29 NOTE — H&P (View-Only) (Signed)
[facility] [place]  Postoperative Visit hemodialysis access   Date of Surgery: 12/23/2010    Procedure  Laterality  Anesthesia    REMOVAL OF ARTERIOVENOUS GORETEX GRAFT (AVGG)  Left  General          INSERTION OF DIALYSIS CATHETER  Right  General    Right Internal Jugular 28cm dialysis catheter insertion       Surgeon: CE Fields, MD  HPI: [name] is a 55 y.o. female who is 2 weeks S/P removal of left upper extremity Hemodialysis access. The patient denies symptoms of tingling, weakness and denies pain in the operative limb.  Patient is here for post -op evaluation and staple removal and discussion regarding new HD access. Pt. Left arm is healing well. Pt denies fever or chills. Vein Mapping done last year shows small cephalic and basilic veins less than 3mm. Reviewed vein mapping with Dr. Darrick Penna - Rec. AVGG right arm  Pt is on hemodialysis through  right IJ catheter on t th sat.  Past Medical History  Diagnosis Date  . Chronic kidney disease   . Liver disease   . Hepatitis C   . Diabetes mellitus   . Hypertension   . Anemia   . Chronic back pain   . Diverticulosis   . CAD (coronary artery disease)   . Depression   . GERD (gastroesophageal reflux disease)   . Thyroid disease   . Obesity   . Leg pain   . Peripheral vascular disease hands and legs  . Anxiety   . Arthritis   . Headache   . Blood transfusion   . Hypothyroidism   . Asthma   . Shortness of breath    History  Substance Use Topics  . Smoking status: Current Everyday Smoker -- 0.5 packs/day    Types: Cigarettes  . Smokeless tobacco: Not on file  . Alcohol Use: No   Current Outpatient Prescriptions  Medication Sig Dispense Refill  . albuterol (PROVENTIL,VENTOLIN) 90 MCG/ACT inhaler Inhale 2 puffs into the lungs every 4 (four) hours as needed. For shortness of breath      . aspirin EC 81 MG tablet Take 81 mg by mouth daily.        . calcium carbonate (TUMS - DOSED IN MG  ELEMENTAL CALCIUM) 500 MG chewable tablet Chew 1 tablet by mouth daily.       [name] docusate sodium (COLACE) 100 MG capsule Take 100 mg by mouth 2 (two) times daily.        . DULoxetine (CYMBALTA) 60 MG capsule Take 60 mg by mouth daily.        [name] gabapentin (NEURONTIN) 600 MG tablet Take 600 mg by mouth daily.        . Levothyroxine Sodium (SYNTHROID PO) Take 0.2 mg by mouth daily.        [name] LISINOPRIL PO Take 20 mg by mouth at bedtime.       . metoprolol succinate (TOPROL-XL) 25 MG 24 hr tablet Take 25 mg by mouth 2 (two) times daily.        . mycophenolate (MYFORTIC) 180 MG EC tablet Take 360 mg by mouth 2 (two) times daily.       . pregabalin (LYRICA) 25 MG capsule Take 1 capsule (25 mg total) by mouth daily at 8 pm.  30 capsule  0  . Promethazine HCl (PHENERGAN PO) Take 12.5 mg by mouth every 6 (six) hours as needed. For nausea      .  senna (SENOKOT) 8.6 MG tablet Take 2 tablets by mouth daily.       . sertraline (ZOLOFT) 25 MG tablet Take 100 mg by mouth daily.       . tacrolimus (PROGRAF) 1 MG capsule Take 6 mg by mouth 2 (two) times daily.        . traMADol (ULTRAM) 50 MG tablet Take 1 tablet 2 x per day as needed for pain  30 tablet  0   ROS: reviewed from last admission and there are no changes  Physical Examination Filed Vitals:   01/09/11 1549  BP: 174/85  Pulse: 108  Resp: 18  Height: 5\' 4"  (1.626 m)  Weight: 128 lb (58.06 kg)   WDWN female in NAD. A&Ox 3 HEENT grossly WNL Gait normal Lungs Clear Heart RRR left upper extremity Incision is healed There is a small soft hematoma at antecubital space - may be a seroma Skin color is normal   Hand grip is 5/5 and sensation in digits is intact;  Staples were removed without difficulty   Assessment/Plan [name] is a 55 y.o. year old who is s/p removal of infected left arm Hemodialysis access. She is being dialyzed through a right IJ diatek catheter She will need a new AVGG in right upper extremity which we will  schedule  Clinic MD: CE Darrick Penna, MD

## 2011-01-29 NOTE — Anesthesia Procedure Notes (Signed)
Procedure Name: MAC Date/Time: 01/29/2011 5:12 PM Performed by: Charm Barges, Qualyn Oyervides R Pre-anesthesia Checklist: Patient identified, Emergency Drugs available, Suction available, Patient being monitored and Timeout performed Patient Re-evaluated:Patient Re-evaluated prior to inductionOxygen Delivery Method: Nasal Cannula

## 2011-01-29 NOTE — Anesthesia Postprocedure Evaluation (Signed)
Anesthesia Post Note  Patient: [name]  Procedure(s) Performed:  INSERTION OF ARTERIOVENOUS (AV) GORE-TEX GRAFT ARM  Anesthesia type: MAC  Patient location: PACU  Post pain: Pain level controlled and Adequate analgesia  Post assessment: Post-op Vital signs reviewed, Patient's Cardiovascular Status Stable and Respiratory Function Stable  Last Vitals:  Filed Vitals:   01/29/11 0733  BP: 169/85  Pulse: 67  Temp: 36.7 C  Resp: 18    Post vital signs: Reviewed and stable  Level of consciousness: awake, alert  and oriented  Complications: No apparent anesthesia complications

## 2011-01-29 NOTE — Transfer of Care (Signed)
Immediate Anesthesia Transfer of Care Note  Patient: [name]  Procedure(s) Performed:  INSERTION OF ARTERIOVENOUS (AV) GORE-TEX GRAFT ARM  Patient Location: PACU  Anesthesia Type: MAC  Level of Consciousness: awake, alert  and oriented  Airway & Oxygen Therapy: Patient Spontanous Breathing  Post-op Assessment: Report given to PACU RN, Post -op Vital signs reviewed and stable and Patient moving all extremities  Post vital signs: Reviewed and stable  Complications: No apparent anesthesia complications

## 2011-01-29 NOTE — Progress Notes (Signed)
IV team present to deaccess diatek.

## 2011-01-29 NOTE — Progress Notes (Deleted)
Pt taken on stretcher back to OR for ligation of AVF created earlier today; pt accompanied by D. Charm Barges, Scientist, clinical (histocompatibility and immunogenetics).

## 2011-01-29 NOTE — Progress Notes (Signed)
Pt complains of numbness tingling entire right hand.  This has audible radial and ulnar doppler.  Hand is also cool.  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  Keep NPO   Will need graft ligation today.  Fabienne Bruns, MD [facility] [place] Office: [phone] Pager: [phone]

## 2011-01-29 NOTE — Progress Notes (Signed)
Notified Dr. Michelle Piper that pt's systolic bp ranges from 170s-190s and pt did not take her bp med earlier today.

## 2011-01-29 NOTE — Op Note (Signed)
Procedure: Ligation Right Upper Arm AV graft  Preop: Ischemic steal right arm av graft  Postop: same  Indications: Pt had right upper arm av graft placed earlier today.  She now has numbness tingling aching and coolness in her right hand.  She is returned to the OR for ligation of the graft to improve blood flow.  Anesthesia: Local with MAC  Findings:Ligation of 4mm end of graft with silk tie   Procedure Details: The right upper extremity was prepped and draped in usual sterile fashion.  A longitudinal incision was then made near the antecubital crease the right arm. The patient's radial doppler signal was examined as the graft was ligated and there was significant improvement in flow.  After hemostasis was obtained, the subcutaneous tissues were reapproximated using a running 3-0 Vicryl suture. The skin was then closed with a 4 0 Vicryl subcuticular stitch. Dermabond was applied to the skin incisions.  The patient tolerated the procedure well and there were no complications.  Instrument sponge and needle count was correct at the end of the case.  The patient was taken to the recovery room in stable condition. The patient had audible radial and ulnar doppler signals at the end of the case.  Fabienne Bruns, MD [facility] [place] Office: [phone] Pager: [phone]

## 2011-01-29 NOTE — Progress Notes (Signed)
Pt evaluated by preop RN and taken by D. Charm Barges, CRNA, back to surgery.

## 2011-01-29 NOTE — Anesthesia Preprocedure Evaluation (Signed)
Anesthesia Evaluation  Patient identified by MRN, date of birth, ID band Patient awake    Reviewed: Allergy & Precautions, H&P , NPO status , Patient's Chart, lab work & pertinent test results  Airway Mallampati: II  Neck ROM: full    Dental   Pulmonary shortness of breath, asthma ,          Cardiovascular hypertension, + CAD     Neuro/Psych  Headaches, PSYCHIATRIC DISORDERS Anxiety Depression    GI/Hepatic GERD-  ,(+) Hepatitis -, C  Endo/Other  Diabetes mellitus-Hypothyroidism   Renal/GU      Musculoskeletal   Abdominal   Peds  Hematology   Anesthesia Other Findings Pt is s/p avg creation earlier today.  Now has symptoms of steal in the right hand.  Reproductive/Obstetrics                           Anesthesia Physical Anesthesia Plan  ASA: III  Anesthesia Plan: MAC   Post-op Pain Management:    Induction: Intravenous  Airway Management Planned: Simple Face Mask  Additional Equipment:   Intra-op Plan:   Post-operative Plan:   Informed Consent: I have reviewed the patients History and Physical, chart, labs and discussed the procedure including the risks, benefits and alternatives for the proposed anesthesia with the patient or authorized representative who has indicated his/her understanding and acceptance.     Plan Discussed with: CRNA and Surgeon  Anesthesia Plan Comments:         Anesthesia Quick Evaluation

## 2011-01-29 NOTE — Op Note (Signed)
Procedure: Right Upper Arm AV graft  Preop: ESRD  Postop: ESRD  Anesthesia: General  Findings:4-7 mm PTFE end to end to axillary vein   Procedure Details: The right upper extremity was prepped and draped in usual sterile fashion.  A longitudinal incision was then made near the antecubital crease the right arm.  There were no suitable antecubital veins for outflow.   The incision was carried into the subcutaneous tissues down to level of the brachial artery.  Next the brachial artery was dissected free in the medial portion incision. The artery was  2-3 mm in diameter. The vessel loops were placed proximal and distal to the planned site of arteriotomy. At this point, a longitudinal incision was made in the axilla and carried through the subcutaneous tissues and fascia to expose the axillary vein.  The nerves were protected.  The vein was approximately 4-5 mm in diameter. Next, a subcutaneous tunnel was created connecting the upper arm to the lower arm incision in an arcing configuration over the biceps muscle.  A 4-7 mm PTFE graft was then brought through this subcutaneous tunnel. The patient was given 5000 units of intravenous heparin. After appropriate circulation time, the vessel loops were used to control the artery. A longitudinal opening was made in the right brachial artery.  The 4 mm end of the graft was beveled and sewn end to side to the artery using a 6 0 prolene.  At completion of the anastomosis the artery was forward bled, backbled and thoroughly flushed.  The anastomosis was secured, vessel loops were released and there was palpable pulse in the graft.  The graft was clamped just above the arterial anastomosis with a fistula clamp. The graft was then pulled taut to length at the axillary incision.  The axillary vein was controlled with a fine bulldog clamp in the upper axilla.  The vein was transected and spatulated.  The distal end of the graft was then beveled and sewn end to end to the  vein using a running 6 0 prolene.  Just prior to completion of the anastomosis, everything was forward bled, back bled and thoroughly flushed.  The anastomosis was secured and the fistula clamp removed from the proximal graft.  A thrill was immediately palpable in the graft. The patient was given 50 mg of protamine to assist with hemostasis.  After hemostasis was obtained, the subcutaneous tissues were reapproximated using a running 3-0 Vicryl suture. The skin was then closed with a 4 0 Vicryl subcuticular stitch. Dermabond was applied to the skin incisions.  The patient tolerated the procedure well and there were no complications.  Instrument sponge and needle count was correct at the end of the case.  The patient was taken to the recovery room in stable condition. The patient had audible radial and ulnar doppler signals at the end of the case.  Fabienne Bruns, MD [facility] [place] Office: [phone] Pager: [phone]

## 2011-01-29 NOTE — Preoperative (Signed)
Beta Blockers   Reason not to administer Beta Blockers:Not Applicable 

## 2011-01-29 NOTE — Interval H&P Note (Signed)
History and Physical Interval Note:  01/29/2011 8:23 AM  [name]  has presented today for surgery, with the diagnosis of ESRD  The various methods of treatment have been discussed with the patient and family. After consideration of risks, benefits and other options for treatment, the patient has consented to  Procedure(s): INSERTION OF ARTERIOVENOUS (AV) GORE-TEX GRAFT ARM as a surgical intervention .  The patients' history has been reviewed, patient examined, no change in status, stable for surgery.  I have reviewed the patients' chart and labs.  Questions were answered to the patient's satisfaction.     [name] E

## 2011-01-29 NOTE — Transfer of Care (Signed)
Immediate Anesthesia Transfer of Care Note  Patient: [name]  Procedure(s) Performed:  LIGATION OF ARTERIOVENOUS  FISTULA  Patient Location: PACU  Anesthesia Type: MAC  Level of Consciousness: awake  Airway & Oxygen Therapy: Patient connected to nasal cannula oxygen  Post-op Assessment: Report given to PACU RN, Post -op Vital signs reviewed and stable and Patient moving all extremities  Post vital signs: Reviewed and stable  Complications: No apparent anesthesia complications

## 2011-01-29 NOTE — Anesthesia Preprocedure Evaluation (Signed)
Anesthesia Evaluation  Patient identified by MRN, date of birth, ID band Patient awake    Reviewed: Allergy & Precautions, H&P , NPO status , Patient's Chart, lab work & pertinent test results  Airway Mallampati: II  Neck ROM: full    Dental   Pulmonary shortness of breath, asthma ,          Cardiovascular hypertension, + CAD     Neuro/Psych  Headaches, PSYCHIATRIC DISORDERS Anxiety Depression    GI/Hepatic GERD-  ,(+) Hepatitis -, C  Endo/Other  Diabetes mellitus-Hypothyroidism   Renal/GU      Musculoskeletal   Abdominal   Peds  Hematology   Anesthesia Other Findings   Reproductive/Obstetrics                           Anesthesia Physical Anesthesia Plan  ASA: III  Anesthesia Plan: General   Post-op Pain Management:    Induction: Intravenous  Airway Management Planned: LMA  Additional Equipment:   Intra-op Plan:   Post-operative Plan:   Informed Consent: I have reviewed the patients History and Physical, chart, labs and discussed the procedure including the risks, benefits and alternatives for the proposed anesthesia with the patient or authorized representative who has indicated his/her understanding and acceptance.     Plan Discussed with: CRNA and Surgeon  Anesthesia Plan Comments:         Anesthesia Quick Evaluation

## 2011-01-30 ENCOUNTER — Encounter (HOSPITAL_COMMUNITY): Payer: Self-pay | Admitting: Vascular Surgery

## 2011-01-30 NOTE — Anesthesia Postprocedure Evaluation (Addendum)
Anesthesia Post Note  Patient: [name]  Procedure(s) Performed:  LIGATION OF ARTERIOVENOUS  FISTULA  Anesthesia type: MAC  Patient location: PACU  Post pain: Pain level controlled and Adequate analgesia  Post assessment: Post-op Vital signs reviewed, Patient's Cardiovascular Status Stable and Respiratory Function Stable  Last Vitals:  Filed Vitals:   01/29/11 2029  BP: 184/90  Pulse: 59  Temp: 36.4 C  Resp: 20    Post vital signs: Reviewed and stable  Level of consciousness: awake, alert  and oriented  Complications: No apparent anesthesia complications

## 2011-02-05 ENCOUNTER — Other Ambulatory Visit: Payer: Self-pay | Admitting: *Deleted

## 2011-02-05 DIAGNOSIS — R52 Pain, unspecified: Secondary | ICD-10-CM

## 2011-02-05 MED ORDER — PREGABALIN 25 MG PO CAPS: 25.0000 mg | ORAL_CAPSULE | Freq: Every day | ORAL | Status: DC

## 2011-02-13 ENCOUNTER — Other Ambulatory Visit: Payer: Self-pay

## 2011-02-13 DIAGNOSIS — N19 Unspecified kidney failure: Secondary | ICD-10-CM

## 2011-02-13 DIAGNOSIS — Z01810 Encounter for preprocedural cardiovascular examination: Secondary | ICD-10-CM

## 2011-02-26 ENCOUNTER — Encounter: Payer: Self-pay | Admitting: Vascular Surgery

## 2011-02-27 ENCOUNTER — Encounter: Payer: Self-pay | Admitting: Vascular Surgery

## 2011-02-27 ENCOUNTER — Ambulatory Visit (INDEPENDENT_AMBULATORY_CARE_PROVIDER_SITE_OTHER): Payer: Managed Care, Other (non HMO) | Admitting: Vascular Surgery

## 2011-02-27 VITALS — BP 154/93 | HR 68 | Resp 16 | Ht 64.0 in | Wt 130.0 lb

## 2011-02-27 DIAGNOSIS — N19 Unspecified kidney failure: Secondary | ICD-10-CM

## 2011-02-27 DIAGNOSIS — Z01810 Encounter for preprocedural cardiovascular examination: Secondary | ICD-10-CM

## 2011-02-27 DIAGNOSIS — N186 End stage renal disease: Secondary | ICD-10-CM

## 2011-02-27 DIAGNOSIS — Z992 Dependence on renal dialysis: Secondary | ICD-10-CM | POA: Insufficient documentation

## 2011-02-27 NOTE — Progress Notes (Signed)
ABI performed 02/27/2011 @ [facility]

## 2011-02-27 NOTE — Progress Notes (Signed)
BLE vein map performed @ [facility] 02/27/2011

## 2011-02-27 NOTE — Progress Notes (Signed)
[facility] [place] [facility] HISTORY AND PHYSICAL    History of Present Illness:  Patient is a 55 y.o. year old female who presents for consideration of a new hemodialysis access. The patient previously had a left arm AV graft which was removed for infection. She was recently had a right arm AV graft which required ligation for steal. She presents today for consideration of placement of a thigh graft. She denies claudication symptoms. She is a smoker and also has diabetes.  Review of systems: She denies shortness of breath or chest pain today   Physical Examination  Filed Vitals:   02/27/11 1557  BP: 154/93  Pulse: 68  Resp: 16    Body mass index is 22.31 kg/(m^2).  General:  Alert and oriented, no acute distress Neck: No bruit or JVD Skin: No rash Extremities:  2+ femoral and dorsalis pedis pulses bilaterally   Neurologic: Upper and lower extremity motor 5/5 and symmetric  DATA: She had a vein mapping ultrasound of her saphenous vein bilaterally today which I reviewed and interpreted. This is a good quality saphenous vein bilaterally   ASSESSMENT: Needs a thigh graft for hemodialysis access. I would not place a new graft in her right arm because of the steal that she had before. She's also had infection in the left arm and leg infection risk would be high. The labrum option at this point would be a thigh graft.   PLAN: We will place a left thigh AV graft on 03/10/2011 risks benefits possible complications and procedure details including but not limited to steal infection leading graft thrombosis were explained the patient and her daughter today. They understand agree to proceed. We will plan on an overnight admission to the hospital  Makaylin Carlo, MD [facility] Office: [phone] Pager: [phone] 

## 2011-03-04 ENCOUNTER — Other Ambulatory Visit: Payer: Self-pay

## 2011-03-07 ENCOUNTER — Encounter (HOSPITAL_COMMUNITY): Payer: Self-pay

## 2011-03-07 NOTE — Progress Notes (Signed)
Reviewed patient history with Dr. Ivin Booty. Dr. Ivin Booty request CBC and CMET to be done day of surgery.

## 2011-03-07 NOTE — Procedures (Unsigned)
VASCULAR LAB EXAM  INDICATION:  Renal failure.  HISTORY: Diabetes:  Yes. Cardiac:  No. Hypertension:  Yes.  EXAM:  Bilateral lower extremity vein mapping.  IMPRESSION: 1. Bilateral great saphenous veins measured with diameters as noted on     worksheet. 2. Multiple branches present, as noted on worksheet.  ___________________________________________ Janetta Hora. Fields, MD  SH/MEDQ  D:  02/27/2011  T:  02/27/2011  Job:  [id number]

## 2011-03-09 MED ORDER — CEFAZOLIN SODIUM 1-5 GM-% IV SOLN
1.0000 g | Freq: Once | INTRAVENOUS | Status: AC
  Administered 2011-03-10: 1 g via INTRAVENOUS
  Filled 2011-03-09: qty 50

## 2011-03-10 ENCOUNTER — Ambulatory Visit (HOSPITAL_COMMUNITY): Payer: Managed Care, Other (non HMO) | Admitting: Anesthesiology

## 2011-03-10 ENCOUNTER — Encounter (HOSPITAL_COMMUNITY): Payer: Self-pay | Admitting: *Deleted

## 2011-03-10 ENCOUNTER — Inpatient Hospital Stay (HOSPITAL_COMMUNITY)
Admission: RE | Admit: 2011-03-10 | Discharge: 2011-03-12 | DRG: 673 | Disposition: A | Discharge status: Home / Self Care | Payer: Managed Care, Other (non HMO) | Source: Ambulatory Visit | Attending: Vascular Surgery | Admitting: Vascular Surgery

## 2011-03-10 ENCOUNTER — Encounter (HOSPITAL_COMMUNITY)
Admission: RE | Disposition: A | Discharge status: Home / Self Care | Payer: Self-pay | Source: Ambulatory Visit | Attending: Vascular Surgery

## 2011-03-10 ENCOUNTER — Encounter (HOSPITAL_COMMUNITY): Payer: Self-pay | Admitting: Anesthesiology

## 2011-03-10 ENCOUNTER — Encounter (HOSPITAL_COMMUNITY): Payer: Self-pay | Admitting: General Practice

## 2011-03-10 DIAGNOSIS — F3289 Other specified depressive episodes: Secondary | ICD-10-CM | POA: Diagnosis present

## 2011-03-10 DIAGNOSIS — N186 End stage renal disease: Secondary | ICD-10-CM

## 2011-03-10 DIAGNOSIS — K5730 Diverticulosis of large intestine without perforation or abscess without bleeding: Secondary | ICD-10-CM | POA: Diagnosis present

## 2011-03-10 DIAGNOSIS — Z7982 Long term (current) use of aspirin: Secondary | ICD-10-CM

## 2011-03-10 DIAGNOSIS — I120 Hypertensive chronic kidney disease with stage 5 chronic kidney disease or end stage renal disease: Principal | ICD-10-CM | POA: Diagnosis present

## 2011-03-10 DIAGNOSIS — E039 Hypothyroidism, unspecified: Secondary | ICD-10-CM | POA: Diagnosis present

## 2011-03-10 DIAGNOSIS — F329 Major depressive disorder, single episode, unspecified: Secondary | ICD-10-CM | POA: Diagnosis present

## 2011-03-10 DIAGNOSIS — F17200 Nicotine dependence, unspecified, uncomplicated: Secondary | ICD-10-CM | POA: Diagnosis present

## 2011-03-10 DIAGNOSIS — I739 Peripheral vascular disease, unspecified: Secondary | ICD-10-CM | POA: Diagnosis present

## 2011-03-10 DIAGNOSIS — B1920 Unspecified viral hepatitis C without hepatic coma: Secondary | ICD-10-CM | POA: Diagnosis present

## 2011-03-10 DIAGNOSIS — J45909 Unspecified asthma, uncomplicated: Secondary | ICD-10-CM | POA: Diagnosis present

## 2011-03-10 DIAGNOSIS — K7460 Unspecified cirrhosis of liver: Secondary | ICD-10-CM | POA: Diagnosis present

## 2011-03-10 DIAGNOSIS — M479 Spondylosis, unspecified: Secondary | ICD-10-CM | POA: Diagnosis present

## 2011-03-10 DIAGNOSIS — R52 Pain, unspecified: Secondary | ICD-10-CM

## 2011-03-10 DIAGNOSIS — D649 Anemia, unspecified: Secondary | ICD-10-CM | POA: Diagnosis present

## 2011-03-10 DIAGNOSIS — E119 Type 2 diabetes mellitus without complications: Secondary | ICD-10-CM | POA: Diagnosis present

## 2011-03-10 DIAGNOSIS — G8929 Other chronic pain: Secondary | ICD-10-CM | POA: Diagnosis present

## 2011-03-10 DIAGNOSIS — I2510 Atherosclerotic heart disease of native coronary artery without angina pectoris: Secondary | ICD-10-CM | POA: Diagnosis present

## 2011-03-10 DIAGNOSIS — Z992 Dependence on renal dialysis: Secondary | ICD-10-CM

## 2011-03-10 DIAGNOSIS — K5900 Constipation, unspecified: Secondary | ICD-10-CM | POA: Diagnosis present

## 2011-03-10 DIAGNOSIS — Z794 Long term (current) use of insulin: Secondary | ICD-10-CM

## 2011-03-10 DIAGNOSIS — Z79899 Other long term (current) drug therapy: Secondary | ICD-10-CM

## 2011-03-10 DIAGNOSIS — K219 Gastro-esophageal reflux disease without esophagitis: Secondary | ICD-10-CM | POA: Diagnosis present

## 2011-03-10 HISTORY — DX: Pneumonia, unspecified organism: J18.9

## 2011-03-10 HISTORY — DX: Myoneural disorder, unspecified: G70.9

## 2011-03-10 HISTORY — PX: AV FISTULA PLACEMENT: SHX1204

## 2011-03-10 LAB — GLUCOSE, CAPILLARY
Glucose-Capillary: 133 mg/dL — ABNORMAL HIGH (ref 70–99)
Glucose-Capillary: 103 mg/dL — ABNORMAL HIGH (ref 70–99)

## 2011-03-10 LAB — CBC
HCT: 34.1 % — ABNORMAL LOW (ref 36.0–46.0)
Hemoglobin: 10.9 g/dL — ABNORMAL LOW (ref 12.0–15.0)
MCH: 28.2 pg (ref 26.0–34.0)
MCHC: 32 g/dL (ref 30.0–36.0)

## 2011-03-10 LAB — COMPREHENSIVE METABOLIC PANEL
ALT: 43 U/L — ABNORMAL HIGH (ref 0–35)
AST: 40 U/L — ABNORMAL HIGH (ref 0–37)
CO2: 20 mEq/L (ref 19–32)
Calcium: 8.9 mg/dL (ref 8.4–10.5)
Sodium: 137 mEq/L (ref 135–145)
Total Protein: 6.8 g/dL (ref 6.0–8.3)

## 2011-03-10 LAB — POCT I-STAT 4, (NA,K, GLUC, HGB,HCT): Potassium: 4.8 mEq/L (ref 3.5–5.1)

## 2011-03-10 SURGERY — INSERTION OF ARTERIOVENOUS (AV) GORE-TEX GRAFT THIGH
Anesthesia: General | Site: Leg Upper | Laterality: Left | Wound class: Clean

## 2011-03-10 MED ORDER — CALCIUM CARBONATE ANTACID 500 MG PO CHEW
3.0000 | CHEWABLE_TABLET | Freq: Every day | ORAL | Status: DC
  Administered 2011-03-11 – 2011-03-12 (×2): 600 mg via ORAL
  Filled 2011-03-10 (×2): qty 3

## 2011-03-10 MED ORDER — SODIUM CHLORIDE 0.9 % IV SOLN: INTRAVENOUS | Status: DC

## 2011-03-10 MED ORDER — FOLIC ACID 1 MG PO TABS
1.0000 mg | ORAL_TABLET | Freq: Every day | ORAL | Status: DC
  Administered 2011-03-10 – 2011-03-12 (×3): 1 mg via ORAL
  Filled 2011-03-10 (×3): qty 1

## 2011-03-10 MED ORDER — ONDANSETRON HCL 4 MG/2ML IJ SOLN
INTRAMUSCULAR | Status: DC | PRN
  Administered 2011-03-10: 4 mg via INTRAVENOUS

## 2011-03-10 MED ORDER — MUPIROCIN 2 % EX OINT
TOPICAL_OINTMENT | CUTANEOUS | Status: AC
  Administered 2011-03-10: 1
  Filled 2011-03-10: qty 22

## 2011-03-10 MED ORDER — PROMETHAZINE HCL 25 MG/ML IJ SOLN: 6.2500 mg | INTRAMUSCULAR | Status: DC | PRN

## 2011-03-10 MED ORDER — CALCIUM CARBONATE ANTACID 500 MG PO CHEW
1.0000 | CHEWABLE_TABLET | Freq: Every day | ORAL | Status: DC
  Filled 2011-03-10: qty 1

## 2011-03-10 MED ORDER — GABAPENTIN 600 MG PO TABS
600.0000 mg | ORAL_TABLET | Freq: Every day | ORAL | Status: DC
  Administered 2011-03-10 – 2011-03-11 (×2): 600 mg via ORAL
  Filled 2011-03-10 (×3): qty 1

## 2011-03-10 MED ORDER — MEPERIDINE HCL 25 MG/ML IJ SOLN: 6.2500 mg | INTRAMUSCULAR | Status: DC | PRN

## 2011-03-10 MED ORDER — LISINOPRIL 20 MG PO TABS
20.0000 mg | ORAL_TABLET | Freq: Every day | ORAL | Status: DC
  Administered 2011-03-10 – 2011-03-11 (×2): 20 mg via ORAL
  Filled 2011-03-10 (×3): qty 1

## 2011-03-10 MED ORDER — FENTANYL CITRATE 0.05 MG/ML IJ SOLN: 25.0000 ug | INTRAMUSCULAR | Status: DC | PRN

## 2011-03-10 MED ORDER — LEVOTHYROXINE SODIUM 200 MCG PO TABS
200.0000 ug | ORAL_TABLET | Freq: Every day | ORAL | Status: DC
  Administered 2011-03-10 – 2011-03-12 (×3): 200 ug via ORAL
  Filled 2011-03-10 (×3): qty 1

## 2011-03-10 MED ORDER — SODIUM CHLORIDE 0.9 % IR SOLN
Status: DC | PRN
  Administered 2011-03-10: 09:00:00

## 2011-03-10 MED ORDER — ASPIRIN EC 81 MG PO TBEC
81.0000 mg | DELAYED_RELEASE_TABLET | Freq: Every day | ORAL | Status: DC
  Administered 2011-03-10 – 2011-03-12 (×3): 81 mg via ORAL
  Filled 2011-03-10 (×3): qty 1

## 2011-03-10 MED ORDER — SERTRALINE HCL 100 MG PO TABS
100.0000 mg | ORAL_TABLET | Freq: Every evening | ORAL | Status: DC
  Administered 2011-03-10 – 2011-03-11 (×2): 100 mg via ORAL
  Filled 2011-03-10 (×3): qty 1

## 2011-03-10 MED ORDER — HEPARIN SODIUM (PORCINE) 1000 UNIT/ML IJ SOLN
INTRAMUSCULAR | Status: DC | PRN
  Administered 2011-03-10: 5 mL via INTRAVENOUS

## 2011-03-10 MED ORDER — PROMETHAZINE HCL 25 MG PO TABS
12.5000 mg | ORAL_TABLET | Freq: Four times a day (QID) | ORAL | Status: DC | PRN
  Administered 2011-03-10: 12.5 mg via ORAL
  Filled 2011-03-10: qty 1

## 2011-03-10 MED ORDER — PROPOFOL 10 MG/ML IV EMUL
INTRAVENOUS | Status: DC | PRN
  Administered 2011-03-10: 130 mg via INTRAVENOUS

## 2011-03-10 MED ORDER — DULOXETINE HCL 60 MG PO CPEP
60.0000 mg | ORAL_CAPSULE | Freq: Every day | ORAL | Status: DC
  Administered 2011-03-10 – 2011-03-12 (×3): 60 mg via ORAL
  Filled 2011-03-10 (×3): qty 1

## 2011-03-10 MED ORDER — PROTAMINE SULFATE 10 MG/ML IV SOLN
INTRAVENOUS | Status: DC | PRN
  Administered 2011-03-10: 50 mg via INTRAVENOUS

## 2011-03-10 MED ORDER — INSULIN ASPART 100 UNIT/ML ~~LOC~~ SOLN
0.0000 [IU] | Freq: Three times a day (TID) | SUBCUTANEOUS | Status: DC
  Filled 2011-03-10: qty 3

## 2011-03-10 MED ORDER — SENNOSIDES 8.6 MG PO TABS: 1.0000 | ORAL_TABLET | Freq: Every evening | ORAL | Status: DC

## 2011-03-10 MED ORDER — INSULIN DETEMIR 100 UNIT/ML ~~LOC~~ SOLN
2.0000 [IU] | Freq: Every evening | SUBCUTANEOUS | Status: DC | PRN
  Filled 2011-03-10: qty 3

## 2011-03-10 MED ORDER — FENTANYL CITRATE 0.05 MG/ML IJ SOLN
INTRAMUSCULAR | Status: DC | PRN
  Administered 2011-03-10: 25 ug via INTRAVENOUS
  Administered 2011-03-10: 50 ug via INTRAVENOUS
  Administered 2011-03-10: 25 ug via INTRAVENOUS
  Administered 2011-03-10: 50 ug via INTRAVENOUS
  Administered 2011-03-10: 25 ug via INTRAVENOUS
  Administered 2011-03-10: 100 ug via INTRAVENOUS
  Administered 2011-03-10: 25 ug via INTRAVENOUS

## 2011-03-10 MED ORDER — SENNA 8.6 MG PO TABS
1.0000 | ORAL_TABLET | Freq: Every evening | ORAL | Status: DC
  Administered 2011-03-10 – 2011-03-11 (×2): 8.6 mg via ORAL
  Filled 2011-03-10 (×3): qty 1

## 2011-03-10 MED ORDER — TACROLIMUS 1 MG PO CAPS
6.0000 mg | ORAL_CAPSULE | Freq: Two times a day (BID) | ORAL | Status: DC
Start: 2011-03-10 — End: 2011-03-12
  Administered 2011-03-10 – 2011-03-12 (×5): 6 mg via ORAL
  Filled 2011-03-10 (×6): qty 6

## 2011-03-10 MED ORDER — ALBUTEROL SULFATE HFA 108 (90 BASE) MCG/ACT IN AERS
2.0000 | INHALATION_SPRAY | RESPIRATORY_TRACT | Status: DC | PRN
  Filled 2011-03-10: qty 6.7

## 2011-03-10 MED ORDER — DOCUSATE SODIUM 100 MG PO CAPS
100.0000 mg | ORAL_CAPSULE | Freq: Two times a day (BID) | ORAL | Status: DC
  Administered 2011-03-10 – 2011-03-12 (×5): 100 mg via ORAL
  Filled 2011-03-10 (×6): qty 1

## 2011-03-10 MED ORDER — PANTOPRAZOLE SODIUM 40 MG PO TBEC
40.0000 mg | DELAYED_RELEASE_TABLET | Freq: Every day | ORAL | Status: DC
  Administered 2011-03-10 – 2011-03-12 (×2): 40 mg via ORAL
  Filled 2011-03-10 (×3): qty 1

## 2011-03-10 MED ORDER — MIDAZOLAM HCL 5 MG/5ML IJ SOLN
INTRAMUSCULAR | Status: DC | PRN
  Administered 2011-03-10: 2 mg via INTRAVENOUS

## 2011-03-10 MED ORDER — SODIUM CHLORIDE 0.9 % IV SOLN
INTRAVENOUS | Status: DC | PRN
  Administered 2011-03-10: 07:00:00 via INTRAVENOUS

## 2011-03-10 MED ORDER — PREGABALIN 25 MG PO CAPS
25.0000 mg | ORAL_CAPSULE | Freq: Every day | ORAL | Status: DC
  Administered 2011-03-10 – 2011-03-11 (×2): 25 mg via ORAL
  Filled 2011-03-10 (×2): qty 1

## 2011-03-10 MED ORDER — PARICALCITOL 5 MCG/ML IV SOLN
2.0000 ug | INTRAVENOUS | Status: DC
  Administered 2011-03-11: 2 ug via INTRAVENOUS
  Filled 2011-03-10: qty 0.4

## 2011-03-10 MED ORDER — POLYETHYLENE GLYCOL 3350 17 G PO PACK
17.0000 g | PACK | Freq: Every day | ORAL | Status: DC | PRN
  Filled 2011-03-10: qty 1

## 2011-03-10 MED ORDER — PHENYLEPHRINE HCL 10 MG/ML IJ SOLN
INTRAMUSCULAR | Status: DC | PRN
  Administered 2011-03-10: 80 ug via INTRAVENOUS
  Administered 2011-03-10 (×2): 40 ug via INTRAVENOUS
  Administered 2011-03-10: 120 ug via INTRAVENOUS
  Administered 2011-03-10: 80 ug via INTRAVENOUS
  Administered 2011-03-10: 40 ug via INTRAVENOUS

## 2011-03-10 MED ORDER — SUCRALFATE 1 G PO TABS
1.0000 g | ORAL_TABLET | Freq: Two times a day (BID) | ORAL | Status: DC
  Administered 2011-03-10 – 2011-03-12 (×5): 1 g via ORAL
  Filled 2011-03-10 (×6): qty 1

## 2011-03-10 MED ORDER — METOPROLOL SUCCINATE ER 25 MG PO TB24
25.0000 mg | ORAL_TABLET | Freq: Two times a day (BID) | ORAL | Status: DC
Start: 2011-03-10 — End: 2011-03-12
  Administered 2011-03-10 – 2011-03-12 (×5): 25 mg via ORAL
  Filled 2011-03-10 (×6): qty 1

## 2011-03-10 MED ORDER — 0.9 % SODIUM CHLORIDE (POUR BTL) OPTIME
TOPICAL | Status: DC | PRN
  Administered 2011-03-10: 1000 mL

## 2011-03-10 MED ORDER — OXYCODONE HCL 5 MG PO TABS
5.0000 mg | ORAL_TABLET | ORAL | Status: DC | PRN
  Administered 2011-03-10 (×2): 5 mg via ORAL
  Administered 2011-03-10 – 2011-03-11 (×3): 10 mg via ORAL
  Administered 2011-03-11: 5 mg via ORAL
  Administered 2011-03-11 – 2011-03-12 (×2): 10 mg via ORAL
  Administered 2011-03-12 (×2): 5 mg via ORAL
  Filled 2011-03-10: qty 1
  Filled 2011-03-10 (×2): qty 2
  Filled 2011-03-10 (×2): qty 1
  Filled 2011-03-10 (×2): qty 2
  Filled 2011-03-10 (×3): qty 1

## 2011-03-10 MED ORDER — DARBEPOETIN ALFA-POLYSORBATE 60 MCG/0.3ML IJ SOLN
60.0000 ug | INTRAMUSCULAR | Status: DC
  Administered 2011-03-11: 60 ug via INTRAVENOUS
  Filled 2011-03-10: qty 0.3

## 2011-03-10 MED ORDER — ALBUTEROL 90 MCG/ACT IN AERS: 2.0000 | INHALATION_SPRAY | RESPIRATORY_TRACT | Status: DC | PRN

## 2011-03-10 MED ORDER — INSULIN ASPART 100 UNIT/ML ~~LOC~~ SOLN
0.0000 [IU] | Freq: Three times a day (TID) | SUBCUTANEOUS | Status: DC
  Administered 2011-03-10 – 2011-03-11 (×2): 2 [IU] via SUBCUTANEOUS
  Administered 2011-03-12: 3 [IU] via SUBCUTANEOUS
  Administered 2011-03-12: 2 [IU] via SUBCUTANEOUS
  Filled 2011-03-10: qty 3

## 2011-03-10 SURGICAL SUPPLY — 39 items
CANISTER SUCTION 2500CC (MISCELLANEOUS) ×2 IMPLANT
CLIP TI MEDIUM 6 (CLIP) ×2 IMPLANT
CLIP TI WIDE RED SMALL 6 (CLIP) ×2 IMPLANT
CLOTH BEACON ORANGE TIMEOUT ST (SAFETY) ×2 IMPLANT
COVER SURGICAL LIGHT HANDLE (MISCELLANEOUS) ×4 IMPLANT
DERMABOND ADVANCED (GAUZE/BANDAGES/DRESSINGS) ×1
DERMABOND ADVANCED .7 DNX12 (GAUZE/BANDAGES/DRESSINGS) ×1 IMPLANT
DRAPE INCISE IOBAN 66X45 STRL (DRAPES) ×2 IMPLANT
ELECT REM PT RETURN 9FT ADLT (ELECTROSURGICAL) ×2
ELECTRODE REM PT RTRN 9FT ADLT (ELECTROSURGICAL) ×1 IMPLANT
GEL ULTRASOUND 20GR AQUASONIC (MISCELLANEOUS) IMPLANT
GLOVE BIO SURGEON STRL SZ7.5 (GLOVE) ×2 IMPLANT
GLOVE BIOGEL PI IND STRL 6.5 (GLOVE) ×1 IMPLANT
GLOVE BIOGEL PI IND STRL 7.5 (GLOVE) ×1 IMPLANT
GLOVE BIOGEL PI INDICATOR 6.5 (GLOVE) ×1
GLOVE BIOGEL PI INDICATOR 7.5 (GLOVE) ×1
GLOVE SURG SS PI 7.5 STRL IVOR (GLOVE) ×2 IMPLANT
GOWN PREVENTION PLUS XLARGE (GOWN DISPOSABLE) ×4 IMPLANT
GOWN STRL NON-REIN LRG LVL3 (GOWN DISPOSABLE) ×2 IMPLANT
GRAFT GORETEX STRT 4-7X45 (Vascular Products) ×2 IMPLANT
KIT BASIN OR (CUSTOM PROCEDURE TRAY) ×2 IMPLANT
KIT ROOM TURNOVER OR (KITS) ×2 IMPLANT
LOOP VESSEL MINI RED (MISCELLANEOUS) IMPLANT
NS IRRIG 1000ML POUR BTL (IV SOLUTION) ×2 IMPLANT
PACK CV ACCESS (CUSTOM PROCEDURE TRAY) ×2 IMPLANT
PAD ARMBOARD 7.5X6 YLW CONV (MISCELLANEOUS) ×4 IMPLANT
SPONGE GAUZE 4X4 12PLY (GAUZE/BANDAGES/DRESSINGS) ×2 IMPLANT
SPONGE SURGIFOAM ABS GEL 100 (HEMOSTASIS) IMPLANT
SUT PROLENE 6 0 CC (SUTURE) ×4 IMPLANT
SUT VIC AB 2-0 CT1 27 (SUTURE) ×1
SUT VIC AB 2-0 CT1 TAPERPNT 27 (SUTURE) ×1 IMPLANT
SUT VIC AB 3-0 SH 27 (SUTURE) ×2
SUT VIC AB 3-0 SH 27X BRD (SUTURE) ×2 IMPLANT
SUT VICRYL 4-0 PS2 18IN ABS (SUTURE) ×4 IMPLANT
TAPE CLOTH SURG 4X10 WHT LF (GAUZE/BANDAGES/DRESSINGS) ×2 IMPLANT
TOWEL OR 17X24 6PK STRL BLUE (TOWEL DISPOSABLE) ×2 IMPLANT
TOWEL OR 17X26 10 PK STRL BLUE (TOWEL DISPOSABLE) ×2 IMPLANT
UNDERPAD 30X30 INCONTINENT (UNDERPADS AND DIAPERS) IMPLANT
WATER STERILE IRR 1000ML POUR (IV SOLUTION) ×2 IMPLANT

## 2011-03-10 NOTE — Op Note (Signed)
Procedure: Left Thigh AV graft  Preoperative diagnosis: End-stage renal disease  Postoperative diagnosis: Same  Anesthesia: Gen.  Assistant: Lianne Cure, PA-c  Operative findings: 4-7 mm PTFE graft to superficial femoral artery end to side and saphenofemoral junction end to side  Operative details: After obtaining informed consent, the patient was taken to the operating room. The patient was placed in supine position operating table. After induction of general anesthesia and endotracheal intubation the patient's entire left anterior thigh and groin were prepped and draped in usual sterile fashion. Next an oblique incision was made in the left groin crease and carried down through the subcutaneous tissues to the level of the saphenous femoral junction. Greater saphenous vein was dissected free for several centimeters and several small side branches ligated and divided between silk ties. The anterior surface of the left common femoral vein was also dissected free in preparation for clamping. Next, the left common femoral artery was dissected free circumferentially. The superficial femoral artery was dissected free circumferentially for several centimeters after it's origin. Vessel loops were placed proximal and distal the planned site of arteriotomy.  Next a subcutaneous tunnel was created in a loop configuration on the anterior aspect of the left thigh. The lateral aspect of the graft was the arterial limb the medial aspect was the venous limb. A transverse incision was made in the distal thigh for assistance in tunneling. The patient was then given 5000 units of intravenous heparin. Vessel loops were used to control the superficial femoral artery proximally and distally. The graft was beveled on the arterial end and sewn end of graft to side of artery using running 6-0 Prolene suture. Just prior to completion the anastomosis was forebled backbled and thoroughly flushed.   The anastomosis was secured;  vesseloops were released; and there was pulsatile flow in the graft immediately. There was also still pulsatile flow in the superficial femoral artery below this. Attention was then turned to the venous anastomosis. The graft was pulled taut to length. The distal saphenous vein was ligated with a 2 0 silk tie.  The common femoral was controlled proximally with a Cooley clamp. The saphenous vein was transected and opened longitudinally from the proximal sapenous into the common femoral vein.   The graft was cut to length and beveled and sewn end to end to the saphenofemoral junction with the tip of the graft extending out onto the common femoral vein. This was done with a running 6-0 Prolene suture. Just prior completion anastomosis this was forebled backbled and thoroughly flushed.   The anastomosis was secured,  clamps released, and there was a palpable thrill in the vein immediately. Hemostasis was obtained. Subcutaneous tissues of both incisions were closed with a running 3-0 Vicryl suture. The skin of both incisions was closed with 4 Vicryl subcuticular stitch. Dermabond was applied to both incisions. The patient was given 50 mg of protamine.  Patient tolerated the procedure well and there were no complications. Instrument, sponge, and needle counts were correct at the end of the case. The patient was taken to the recovery room in stable condition.  Fabienne Bruns, MD [facility] [place] Office: [phone] Pager: [phone]

## 2011-03-10 NOTE — Transfer of Care (Signed)
Immediate Anesthesia Transfer of Care Note  Patient: [name]  Procedure(s) Performed:  INSERTION OF ARTERIOVENOUS (AV) GORE-TEX GRAFT THIGH  Patient Location: PACU  Anesthesia Type: General  Level of Consciousness: sedated  Airway & Oxygen Therapy: Patient Spontanous Breathing and Patient connected to nasal cannula oxygen  Post-op Assessment: Report given to PACU RN and Post -op Vital signs reviewed and stable  Post vital signs: Reviewed  Complications: No apparent anesthesia complications

## 2011-03-10 NOTE — Anesthesia Postprocedure Evaluation (Signed)
  Anesthesia Post-op Note  Patient: [name]  Procedure(s) Performed:  INSERTION OF ARTERIOVENOUS (AV) GORE-TEX GRAFT THIGH  Patient Location: PACU  Anesthesia Type: General  Level of Consciousness: awake  Airway and Oxygen Therapy: Patient Spontanous Breathing  Post-op Pain: mild  Post-op Assessment: Post-op Vital signs reviewed  Post-op Vital Signs: stable  Complications: No apparent anesthesia complications 

## 2011-03-10 NOTE — Anesthesia Procedure Notes (Signed)
Procedure Name: LMA Insertion Date/Time: 03/10/2011 7:56 AM Performed by: Margaree Mackintosh Pre-anesthesia Checklist: Patient identified, Timeout performed, Emergency Drugs available, Suction available and Patient being monitored Patient Re-evaluated:Patient Re-evaluated prior to inductionOxygen Delivery Method: Circle System Utilized Preoxygenation: Pre-oxygenation with 100% oxygen Intubation Type: IV induction Ventilation: Mask ventilation without difficulty LMA Size: 4.0 Number of attempts: 2 Placement Confirmation: positive ETCO2 and breath sounds checked- equal and bilateral Tube secured with: Tape Dental Injury: Teeth and Oropharynx as per pre-operative assessment

## 2011-03-10 NOTE — Consult Note (Signed)
[facility] Renal Consultation Note  Indication for Consultation:  Management of ESRD/hemodialysis; anemia, hypertension/volume and secondary hyperparathyroidism  HPI: [name] is a 56 y.o. female admitted post op new thigh AVGG by Dr. Darrick Penna. She missed her last hd Sat. 03/08/11 sec. To bad weather. Denying sob or n/v. Some normal postop pain at surgical site.  Dialysis Orders: Center: [facility]  on tths . EDW 58.0 kg HD Bath ca 2.25 k2.0  Time 3.5 hrs Heparin tight. Access rt. ij perm cath BFR 400 DFR 800    Zemplar 2 mcg IV/HD Epogen 9000   Units IV/HD  Venofer  50mg  wkly  Other -    Past Medical History  Diagnosis Date  . Liver disease     cirrhosis and this is why she had the transplant in 2011  . Hepatitis C   . Chronic back pain   . Diverticulosis   . CAD (coronary artery disease)   . Thyroid disease   . Obesity   . Leg pain   . Peripheral vascular disease hands and legs  . Anxiety   . Blood transfusion   . Asthma   . Shortness of breath   . Headache     last migraine 6+yrs ago  . Arthritis     back  . Chronic back pain   . GERD (gastroesophageal reflux disease)     takes Omeprazole daily  . Chronic constipation     takes MIralax and Colace daily  . Oligouria   . Anemia     takes Folic Acid daily  . Hypothyroidism     takes Synthroid daily  . Diabetes mellitus     Levemir 2units daily if > 150  . Depression     takes Cymbalta for "severe" depression  . Pneumonia   . Abdominal  pain, other specified site   . Neuromuscular disorder     carpal tunnel in right hand  . Hypertension     takes Metoprolol and Lisinopril daily, sees Dr Shary Decamp  . Chronic kidney disease     Tue, Thur, Sat, [facility], see Dr. Hyman Hopes    Past Surgical History  Procedure Date  . 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 Odette Horns Coord [phone]  . Small intestine surgery 90's  . Thrombectomy   .  Arteriovenous graft placement 10/03/10    Left forearm AVG  . Avgg removal 12/23/2010    Procedure: REMOVAL OF ARTERIOVENOUS GORETEX GRAFT (AVGG);  Surgeon: Sherren Kerns, MD;  Location: [facility] OR;  Service: Vascular;  Laterality: Left;  procedure started @1736 -1852  . Insertion of dialysis catheter 12/23/2010    Procedure: INSERTION OF DIALYSIS CATHETER;  Surgeon: Sherren Kerns, MD;  Location: [facility] OR;  Service: Vascular;  Laterality: Right;  Right Internal Jugular 28cm dialysis catheter insertion procedure time 1701-1720   . Cholecystectomy 1993  . Cystoscopy 15+yrs ago  . Back surgery 2010  . Av fistula placement 01/29/2011    Procedure: INSERTION OF ARTERIOVENOUS (AV) GORE-TEX GRAFT ARM;  Surgeon: Sherren Kerns, MD;  Location: [facility] OR;  Service: Vascular;  Laterality: Right;  . Tubal ligation       Family History  Problem Relation Age of Onset  . Cancer Mother   . Diabetes Mother   . Hypertension Mother   . Stroke Mother   . Cancer Father   . Anesthesia problems Neg Hx   . Hypotension Neg Hx   .  Malignant hyperthermia Neg Hx   . Pseudochol deficiency Neg Hx       reports that she has been smoking Cigarettes.  She has a 7.5 pack-year smoking history. She does not have any smokeless tobacco history on file. She reports that she does not drink alcohol or use illicit drugs.   Allergies  Allergen Reactions  . Acetaminophen Other (See Comments)    Liver problems  . Codeine Itching    Prior to Admission medications   Medication Sig Start Date End Date Taking? Authorizing Provider  albuterol (PROVENTIL,VENTOLIN) 90 MCG/ACT inhaler Inhale 2 puffs into the lungs every 4 (four) hours as needed. For shortness of breath   Yes Historical Provider, MD  aspirin EC 81 MG tablet Take 81 mg by mouth daily.     Yes Historical Provider, MD  calcium carbonate (TUMS - DOSED IN MG ELEMENTAL CALCIUM) 500 MG chewable tablet Chew 1 tablet by mouth daily.    Yes Historical Provider, MD  docusate  sodium (COLACE) 100 MG capsule Take 100 mg by mouth 2 (two) times daily.     Yes Historical Provider, MD  DULoxetine (CYMBALTA) 60 MG capsule Take 60 mg by mouth daily.     Yes Historical Provider, MD  FOLIC ACID PO Take 0.4 mg by mouth daily.     Yes Historical Provider, MD  gabapentin (NEURONTIN) 600 MG tablet Take 600 mg by mouth at bedtime.    Yes Historical Provider, MD  insulin detemir (LEVEMIR) 100 UNIT/ML injection Inject 2 Units into the skin at bedtime as needed. Uses only if blood sugar levels are over 150    Yes Historical Provider, MD  levothyroxine (SYNTHROID, LEVOTHROID) 200 MCG tablet Take 200 mcg by mouth daily.     Yes Historical Provider, MD  lisinopril (PRINIVIL,ZESTRIL) 20 MG tablet Take 20 mg by mouth at bedtime.     Yes Historical Provider, MD  metoprolol succinate (TOPROL-XL) 25 MG 24 hr tablet Take 25 mg by mouth 2 (two) times daily.     Yes Historical Provider, MD  omeprazole (PRILOSEC) 20 MG capsule Take 20 mg by mouth daily.     Yes Historical Provider, MD  polyethylene glycol (MIRALAX / GLYCOLAX) packet Take 17 g by mouth daily as needed. For constipation    Yes Historical Provider, MD  pregabalin (LYRICA) 25 MG capsule Take 1 capsule (25 mg total) by mouth daily at 8 pm. 02/05/11 02/05/12 Yes Amelia Jo Roczniak, PA  promethazine (PHENERGAN) 12.5 MG tablet Take 12.5 mg by mouth every 6 (six) hours as needed. For nausea    Yes Historical Provider, MD  senna (SENOKOT) 8.6 MG tablet Take 1 tablet by mouth every evening.    Yes Historical Provider, MD  sucralfate (CARAFATE) 1 G tablet Take 1 g by mouth 2 (two) times daily.     Yes Historical Provider, MD  tacrolimus (PROGRAF) 1 MG capsule Take 6 mg by mouth 2 (two) times daily.     Yes Historical Provider, MD  sertraline (ZOLOFT) 100 MG tablet Take 100 mg by mouth every evening.     Historical Provider, MD  traMADol (ULTRAM) 50 MG tablet Take 50 mg by mouth 2 (two) times daily as needed. For pain. Maximum dose= 8 tablets per  day     Historical Provider, MD    ZOX:WRUEAVWUJ, insulin detemir, polyethylene glycol, promethazine, DISCONTD: 0.9 % irrigation (POUR BTL), DISCONTD: albuterol, DISCONTD: fentaNYL, DISCONTD: heparin 6000 unit irrigation, DISCONTD: meperidine, DISCONTD: promethazine  Results for orders placed during  the hospital encounter of 03/10/11 (from the past 48 hour(s))  SURGICAL PCR SCREEN     Status: Normal   Collection Time   03/10/11  6:36 AM      Component Value Range Comment   MRSA, PCR NEGATIVE  NEGATIVE     Staphylococcus aureus NEGATIVE  NEGATIVE    COMPREHENSIVE METABOLIC PANEL     Status: Abnormal   Collection Time   03/10/11  6:37 AM      Component Value Range Comment   Sodium 137  135 - 145 (mEq/L)    Potassium 4.8  3.5 - 5.1 (mEq/L)    Chloride 98  96 - 112 (mEq/L)    CO2 20  19 - 32 (mEq/L)    Glucose, Bld 101 (*) 70 - 99 (mg/dL)    BUN 64 (*) 6 - 23 (mg/dL)    Creatinine, Ser 1.91 (*) 0.50 - 1.10 (mg/dL)    Calcium 8.9  8.4 - 10.5 (mg/dL)    Total Protein 6.8  6.0 - 8.3 (g/dL)    Albumin 2.9 (*) 3.5 - 5.2 (g/dL)    AST 40 (*) 0 - 37 (U/L)    ALT 43 (*) 0 - 35 (U/L)    Alkaline Phosphatase 108  39 - 117 (U/L)    Total Bilirubin 0.2 (*) 0.3 - 1.2 (mg/dL)    GFR calc non Af Amer 4 (*) >90 (mL/min)    GFR calc Af Amer 5 (*) >90 (mL/min)   CBC     Status: Abnormal   Collection Time   03/10/11  6:37 AM      Component Value Range Comment   WBC 4.4  4.0 - 10.5 (K/uL)    RBC 3.86 (*) 3.87 - 5.11 (MIL/uL)    Hemoglobin 10.9 (*) 12.0 - 15.0 (g/dL)    HCT 47.8 (*) 29.5 - 46.0 (%)    MCV 88.3  78.0 - 100.0 (fL)    MCH 28.2  26.0 - 34.0 (pg)    MCHC 32.0  30.0 - 36.0 (g/dL)    RDW 62.1 (*) 30.8 - 15.5 (%)    Platelets 127 (*) 150 - 400 (K/uL)   GLUCOSE, CAPILLARY     Status: Abnormal   Collection Time   03/10/11 10:19 AM      Component Value Range Comment   Glucose-Capillary 136 (*) 70 - 99 (mg/dL)    Comment 1 Documented in Chart      Comment 2 Notify RN      EKG: normal EKG,  normal sinus rhythm, unchanged from previous tracings, atrial fibrillation, rate.  ROS: no cos stated.  Physical Exam: Filed Vitals:   03/10/11 1134  BP:   Pulse: 82  Temp: 98 F (36.7 C)  Resp: 21     General:alert, black female nad, appropriate HEENT:[place], mmm Eyes:nonicteric Neck:supple, no jvd Heart:RRR Lungs:CTA Abdomen:bs pos. Soft ,nt Extremities:no pedal edema, Skin:dry, healed surgical scars left arm Neuro:ox3 Dialysis Access:pos. Bruit. Left femoral avgg, with dry surgical dressing  Assessment/Plan: 1.SP New Left Femoral AVGG placement= per Dr. Darrick Penna 2. ESRD - nl tths hd missed last tx sat but not acidotic, no sob, potassium okayand vol appears okay mild htn but has Lisinopril and Metoprolo to take, will dw Dr. Arrie Aran, wait until am for hd  3 Hypertension/volume  - see above 4. Anemia  - epo and wkly iron fu hgb post op in am 5. Metabolic bone disease -  Zemplar on hd and needs to take her binders "tums "  listed op meds 6. Hepatitis C and end stage liver disease s/p liver tx 10/2009 unc=Prograf/Tacrolimus 7. Type 2 dm =  Lenny Pastel, PA-C [facility] Beeper [phone] 03/10/2011, 12:26 PM   I have seen and examined this patient and agree with plan as outlined by D. Zeyfang, PA-C.  No evidence of pulm edema, or hyperkalemia.  Will wait for HD tomorrow as pt is uncomfortable from post-operative pain.  BP 146/75. Maely Clements A,MD 03/10/2011 3:20 PM

## 2011-03-10 NOTE — Progress Notes (Signed)
Pt arrived via stretcher from PACU. Pt is A/Ox3 and placed on telemetry. Pt says she lives home with her daughter. Pt has pink area on lower sacrum between buttock cheeks, she said she doesn't know what happened. Skin is intact she says she didn't know of any wounds, or problems with her bottom. Pt says she fell at home 3 months ago, she says she thinks it's due to after her liver transplant she had been a little off balanced afterwards. Patient instructed how to use the call bell and to call and not get up without [place]. Call bell, phone, and bedside table with water was placed all within reach. Pt has small area of stain mark on left thigh dressing from the graft placed today in surgery. Will continue to monitor.

## 2011-03-10 NOTE — H&P (View-Only) (Signed)
[facility]ISTORY AND PHYSICAL    History of Present Illness:  Patient is a 56 y.o. year old female who presents for consideration of a new hemodialysis access. The patient previously had a left arm AV graft which was removed for infection. She was recently had a right arm AV graft which required ligation for steal. She presents today for consideration of placement of a thigh graft. She denies claudication symptoms. She is a smoker and also has diabetes.  Review of systems: She denies shortness of breath or chest pain today   Physical Examination  Filed Vitals:   02/27/11 1557  BP: 154/93  Pulse: 68  Resp: 16    Body mass index is 22.31 kg/(m^2).  General:  Alert and oriented, no acute distress Neck: No bruit or JVD Skin: No rash Extremities:  2+ femoral and dorsalis pedis pulses bilaterally   Neurologic: Upper and lower extremity motor 5/5 and symmetric  DATA: She had a vein mapping ultrasound of her saphenous vein bilaterally today which I reviewed and interpreted. This is a good quality saphenous vein bilaterally   ASSESSMENT: Needs a thigh graft for hemodialysis access. I would not place a new graft in her right arm because of the steal that she had before. She's also had infection in the left arm and leg infection risk would be high. The labrum option at this point would be a thigh graft.   PLAN: We will place a left thigh AV graft on 03/10/2011 risks benefits possible complications and procedure details including but not limited to steal infection leading graft thrombosis were explained the patient and her daughter today. They understand agree to proceed. We will plan on an overnight admission to the hospital  Fabienne Bruns, MD [facility] [place] Office: [phone] Pager: [phone]

## 2011-03-10 NOTE — Anesthesia Postprocedure Evaluation (Signed)
  Anesthesia Post-op Note  Patient: [name]  Procedure(s) Performed:  INSERTION OF ARTERIOVENOUS (AV) GORE-TEX GRAFT THIGH  Patient Location: PACU  Anesthesia Type: General  Level of Consciousness: awake  Airway and Oxygen Therapy: Patient Spontanous Breathing  Post-op Pain: mild  Post-op Assessment: Post-op Vital signs reviewed  Post-op Vital Signs: stable  Complications: No apparent anesthesia complications

## 2011-03-10 NOTE — Preoperative (Signed)
Beta Blockers   Reason not to administer Beta Blockers:Not Applicable. Toprol XL 25mg  @ 2300 03/09/11

## 2011-03-10 NOTE — Interval H&P Note (Signed)
History and Physical Interval Note:  03/10/2011 7:36 AM  [name]  has presented today for surgery, with the diagnosis of End Stage Renal Disease  The various methods of treatment have been discussed with the patient and family. After consideration of risks, benefits and other options for treatment, the patient has consented to  Procedure(s): INSERTION OF ARTERIOVENOUS (AV) GORE-TEX GRAFT THIGH as a surgical intervention .  The patients' history has been reviewed, patient examined, no change in status, stable for surgery.  I have reviewed the patients' chart and labs.  Questions were answered to the patient's satisfaction.     [name] E

## 2011-03-10 NOTE — Anesthesia Preprocedure Evaluation (Addendum)
Anesthesia Evaluation  Patient identified by MRN, date of birth, ID band Patient awake    Reviewed: Allergy & Precautions, H&P , NPO status , Patient's Chart, lab work & pertinent test results, reviewed documented beta blocker date and time   Airway Mallampati: II      Dental  (+) Edentulous Upper   Pulmonary shortness of breath, asthma , pneumonia ,          Cardiovascular hypertension, Pt. on medications and Pt. on home beta blockers + CAD Regular Normal    Neuro/Psych  Headaches, PSYCHIATRIC DISORDERS Anxiety Depression  Neuromuscular disease    GI/Hepatic GERD-  Medicated and Controlled,(+) Hepatitis -, C  Endo/Other  Diabetes mellitus-, Type 2, Insulin DependentHypothyroidism   Renal/GU CRF and DialysisRenal disease     Musculoskeletal   Abdominal   Peds  Hematology negative hematology ROS (+)   Anesthesia Other Findings   Reproductive/Obstetrics                          Anesthesia Physical Anesthesia Plan  ASA: III  Anesthesia Plan: General   Post-op Pain Management:    Induction: Intravenous  Airway Management Planned: LMA  Additional Equipment:   Intra-op Plan:   Post-operative Plan: Extubation in OR  Informed Consent: I have reviewed the patients History and Physical, chart, labs and discussed the procedure including the risks, benefits and alternatives for the proposed anesthesia with the patient or authorized representative who has indicated his/her understanding and acceptance.   Dental advisory given  Plan Discussed with: CRNA  Anesthesia Plan Comments:        Anesthesia Quick Evaluation

## 2011-03-11 ENCOUNTER — Encounter (HOSPITAL_COMMUNITY): Payer: Self-pay | Admitting: Vascular Surgery

## 2011-03-11 ENCOUNTER — Inpatient Hospital Stay (HOSPITAL_COMMUNITY): Payer: Managed Care, Other (non HMO)

## 2011-03-11 LAB — GLUCOSE, CAPILLARY: Glucose-Capillary: 133 mg/dL — ABNORMAL HIGH (ref 70–99)

## 2011-03-11 LAB — CBC
HCT: 34.5 % — ABNORMAL LOW (ref 36.0–46.0)
MCHC: 32.2 g/dL (ref 30.0–36.0)
MCV: 88.9 fL (ref 78.0–100.0)
RDW: 16.3 % — ABNORMAL HIGH (ref 11.5–15.5)

## 2011-03-11 LAB — RENAL FUNCTION PANEL
Albumin: 2.8 g/dL — ABNORMAL LOW (ref 3.5–5.2)
BUN: 74 mg/dL — ABNORMAL HIGH (ref 6–23)
Creatinine, Ser: 10.82 mg/dL — ABNORMAL HIGH (ref 0.50–1.10)
Phosphorus: 9.3 mg/dL — ABNORMAL HIGH (ref 2.3–4.6)

## 2011-03-11 LAB — HEMOGLOBIN A1C
Hgb A1c MFr Bld: 6.6 % — ABNORMAL HIGH (ref <5.7)
Mean Plasma Glucose: 143 mg/dL — ABNORMAL HIGH (ref <117)

## 2011-03-11 MED ORDER — PARICALCITOL 5 MCG/ML IV SOLN
INTRAVENOUS | Status: AC
  Filled 2011-03-11: qty 1

## 2011-03-11 MED ORDER — OXYCODONE HCL 5 MG PO TABS
ORAL_TABLET | ORAL | Status: AC
  Filled 2011-03-11: qty 2

## 2011-03-11 MED ORDER — DARBEPOETIN ALFA-POLYSORBATE 60 MCG/0.3ML IJ SOLN
INTRAMUSCULAR | Status: AC
  Filled 2011-03-11: qty 0.3

## 2011-03-11 MED ORDER — OXYCODONE HCL 5 MG PO TABS: 5.0000 mg | ORAL_TABLET | ORAL | Status: AC | PRN

## 2011-03-11 NOTE — Progress Notes (Signed)
Pt daughter called requesting that pt spend one more night in the hospital. Daughter states that last time pt came home too early and had severe pain and almost feel at home. Lianne Cure, PA notified; new orders received. PT to eval in AM. [place], Rosanna Randy

## 2011-03-11 NOTE — Progress Notes (Signed)
[facility] [place]  Subjective  - some pain left groin  Objective 112/71 83 97.8 F (36.6 C) (Oral) 18 96%  Intake/Output Summary (Last 24 hours) at 03/11/11 1017 Last data filed at 03/11/11 0931  Gross per 24 hour  Intake    480 ml  Output    200 ml  Net    280 ml   Left thigh graft incisions healing, + bruit/thrill, no numbness or tingling in foot  Assessment/Planning: POD #1 left thigh AV graft, patent graft D/C Home F/U 2 weeks  FIELDS,CHARLES E 03/11/2011 10:17 AM --  Laboratory Lab Results:  Basename 03/10/11 0644 03/10/11 0637  WBC -- 4.4  HGB 11.6* 10.9*  HCT 34.0* 34.1*  PLT -- 127*   BMET  Basename 03/10/11 0644 03/10/11 0637  NA 136 137  K 4.8 4.8  CL -- 98  CO2 -- 20  GLUCOSE 105* 101*  BUN -- 64*  CREATININE -- 9.36*  CALCIUM -- 8.9    COAG Lab Results  Component Value Date   INR 0.93 12/23/2010   INR 0.87 12/23/2010   INR 1.2 10/13/2008   No results found for this basename: PTT    Antibiotics Anti-infectives     Start     Dose/Rate Route Frequency Ordered Stop   03/09/11 0945   ceFAZolin (ANCEF) IVPB 1 g/50 mL premix        1 g 100 mL/hr over 30 Minutes Intravenous  Once 03/09/11 0941 03/10/11 0743

## 2011-03-11 NOTE — Progress Notes (Signed)
Patient ID: [name], female   DOB: November 12, 1955, 56 y.o.   MRN: [id number] S:"sore" but o/w no complaints   O: BP 112/71  Pulse 83  Temp(Src) 97.8 F (36.6 C) (Oral)  Resp 18  Ht 5' 4.5" (1.638 m)  Wt 59.2 kg (130 lb 8.2 oz)  BMI 22.06 kg/m2  SpO2 96%  Gen:WD WN AAF in NAD CVS:RRR Resp:CTA WUJ:WJXBJY NWG:NFAO thigh AVG +T/B      . aspirin EC  81 mg Oral Daily  . calcium carbonate  3 tablet Oral Daily  . darbepoetin (ARANESP) injection - DIALYSIS  60 mcg Intravenous Q Tue-HD  . docusate sodium  100 mg Oral BID  . DULoxetine  60 mg Oral Daily  . folic acid  1 mg Oral Daily  . gabapentin  600 mg Oral QHS  . insulin aspart  0-15 Units Subcutaneous TID WC  . levothyroxine  200 mcg Oral Daily  . lisinopril  20 mg Oral QHS  . metoprolol succinate  25 mg Oral BID  . pantoprazole  40 mg Oral Q1200  . paricalcitol  2 mcg Intravenous Q T,Th,Sa-HD  . pregabalin  25 mg Oral Q2000  . senna  1 tablet Oral QPM  . sertraline  100 mg Oral QPM  . sucralfate  1 g Oral BID  . tacrolimus  6 mg Oral BID  . DISCONTD: calcium carbonate  1 tablet Oral Daily  . DISCONTD: insulin aspart  0-15 Units Subcutaneous TID WC  . DISCONTD: senna  1 tablet Oral QPM    BMET  Lab 03/10/11 0644 03/10/11 0637  NA 136 137  K 4.8 4.8  CL -- 98  CO2 -- 20  GLUCOSE 105* 101*  BUN -- 64*  CREATININE -- 9.36*  ALB -- --  CALCIUM -- 8.9  PHOS -- --   CBC  Lab 03/10/11 0644 03/10/11 0637  WBC -- 4.4  NEUTROABS -- --  HGB 11.6* 10.9*  HCT 34.0* 34.1*  MCV -- 88.3  PLT -- 127*    @IMGRELPRIORS @  Assessment/Plan: 1.vasc access- +T/B 2.ESRD cont qTTS 3. Anemia:stable 4. Dispo- hopeful d/c today after HD 5. Nutrition:stable 6. Hypertension:stable [name] A

## 2011-03-11 NOTE — Progress Notes (Signed)
Called pt daughter Ella Bodo back at number she provided [phone]. Left message requesting that daughter call me back at hospital to discuss pt discharge cancel. [place], Rosanna Randy

## 2011-03-11 NOTE — Discharge Summary (Addendum)
[facility] Discharge Summary   Patient ID:  [name] MRN: [id number] DOB/AGE: [birth year] 56 y.o.  Admit date: 03/10/2011 Discharge date: 03/11/2011 Date of Surgery: 03/10/2011 Surgeon: Surgeon(s): Sherren Kerns, MD  Admission Diagnosis: End Stage Renal Disease  Discharge Diagnoses:  End Stage Renal Disease  Secondary Diagnoses: Past Medical History  Diagnosis Date  . Liver disease     cirrhosis and this is why she had the transplant in 2011  . Hepatitis C   . Chronic back pain   . Diverticulosis   . CAD (coronary artery disease)   . Thyroid disease   . Obesity   . Leg pain   . Peripheral vascular disease hands and legs  . Anxiety   . Blood transfusion   . Asthma   . Shortness of breath   . Headache     last migraine 6+yrs ago  . Arthritis     back  . Chronic back pain   . GERD (gastroesophageal reflux disease)     takes Omeprazole daily  . Chronic constipation     takes MIralax and Colace daily  . Oligouria   . Anemia     takes Folic Acid daily  . Hypothyroidism     takes Synthroid daily  . Diabetes mellitus     Levemir 2units daily if > 150  . Depression     takes Cymbalta for "severe" depression  . Pneumonia   . Abdominal  pain, other specified site   . Neuromuscular disorder     carpal tunnel in right hand  . Hypertension     takes Metoprolol and Lisinopril daily, sees Dr Shary Decamp  . Chronic kidney disease     Tue, Thur, Sat, [facility], see Dr. Hyman Hopes    Procedure(s): INSERTION OF ARTERIOVENOUS (AV) GORE-TEX GRAFT THIGH  Discharged Condition: good  HPI: Patient is a 56 y.o. year old female who presents for consideration of a new hemodialysis access. The patient previously had a left arm AV graft which was removed for infection. She was recently had a right arm AV graft which required ligation for steal. She presents today for consideration of placement of a thigh graft. She denies claudication symptoms. She is a  smoker and also has diabetes.    Hospital Course:  LYNZEE LINDQUIST is a 56 y.o. female is S/P Left Procedure(s): INSERTION OF ARTERIOVENOUS (AV) GORE-TEX GRAFT THIGH Extubated: POD # 0 Post-op wounds clean, dry, intact or healing well Pt. Ambulating, voiding and taking PO diet without difficulty. Pt pain controlled with PO pain meds. Labs as below Complications:none  Consults:  Treatment Team:  Irena Cords, MD  Significant Diagnostic Studies: CBC    Component Value Date/Time   WBC 4.4 03/10/2011 0637   RBC 3.86* 03/10/2011 0637   HGB 11.6* 03/10/2011 0644   HCT 34.0* 03/10/2011 0644   PLT 127* 03/10/2011 0637   MCV 88.3 03/10/2011 0637   MCH 28.2 03/10/2011 0637   MCHC 32.0 03/10/2011 0637   RDW 15.9* 03/10/2011 0637   LYMPHSABS 1.1 12/23/2010 0304   MONOABS 0.7 12/23/2010 0304   EOSABS 0.1 12/23/2010 0304   BASOSABS 0.0 12/23/2010 0304    BMET    Component Value Date/Time   NA 136 03/10/2011 0644   K 4.8 03/10/2011 0644   CL 98 03/10/2011 0637   CO2 20 03/10/2011 0637   GLUCOSE 105* 03/10/2011 0644   BUN 64* 03/10/2011 0637   CREATININE 9.36* 03/10/2011 0637   CALCIUM  8.9 03/10/2011 0637   GFRNONAA 4* 03/10/2011 0637   GFRAA 5* 03/10/2011 0637    COAG Lab Results  Component Value Date   INR 0.93 12/23/2010   INR 0.87 12/23/2010   INR 1.2 10/13/2008     Disposition:  Discharge to :Home Discharge Orders    Future Orders Please Complete By Expires   Resume previous diet      Driving Restrictions      Comments:   No driving for 4 weeks   Lifting restrictions      Comments:   No lifting for 4 weeks   Call MD for:  temperature >100.5      Call MD for:  redness, tenderness, or signs of infection (pain, swelling, bleeding, redness, odor or green/yellow discharge around incision site)      Call MD for:  severe or increased pain, loss or decreased feeling  in affected limb(s)      Increase activity slowly      Comments:   Walk with assistance use walker or cane  as needed   May shower       Scheduling Instructions:   May shower with soap and water in 24 hours   No dressing needed         Ann, Bohne  Home Medication Instructions JYN:[id number]   Printed on:03/11/11 0823  Medication Information                    DULoxetine (CYMBALTA) 60 MG capsule Take 60 mg by mouth daily.             metoprolol succinate (TOPROL-XL) 25 MG 24 hr tablet Take 25 mg by mouth 2 (two) times daily.             albuterol (PROVENTIL,VENTOLIN) 90 MCG/ACT inhaler Inhale 2 puffs into the lungs every 4 (four) hours as needed. For shortness of breath           tacrolimus (PROGRAF) 1 MG capsule Take 6 mg by mouth 2 (two) times daily.             calcium carbonate (TUMS - DOSED IN MG ELEMENTAL CALCIUM) 500 MG chewable tablet Chew 1 tablet by mouth daily.            gabapentin (NEURONTIN) 600 MG tablet Take 600 mg by mouth at bedtime.            senna (SENOKOT) 8.6 MG tablet Take 1 tablet by mouth every evening.            docusate sodium (COLACE) 100 MG capsule Take 100 mg by mouth 2 (two) times daily.             aspirin EC 81 MG tablet Take 81 mg by mouth daily.             lisinopril (PRINIVIL,ZESTRIL) 20 MG tablet Take 20 mg by mouth at bedtime.             levothyroxine (SYNTHROID, LEVOTHROID) 200 MCG tablet Take 200 mcg by mouth daily.             promethazine (PHENERGAN) 12.5 MG tablet Take 12.5 mg by mouth every 6 (six) hours as needed. For nausea            sertraline (ZOLOFT) 100 MG tablet Take 100 mg by mouth every evening.            traMADol (ULTRAM) 50 MG tablet Take 50 mg by mouth 2 (  two) times daily as needed. For pain. Maximum dose= 8 tablets per day            polyethylene glycol (MIRALAX / GLYCOLAX) packet Take 17 g by mouth daily as needed. For constipation            omeprazole (PRILOSEC) 20 MG capsule Take 20 mg by mouth daily.             FOLIC ACID PO Take 0.4 mg by mouth daily.             sucralfate (CARAFATE) 1 G  tablet Take 1 g by mouth 2 (two) times daily.             insulin detemir (LEVEMIR) 100 UNIT/ML injection Inject 2 Units into the skin at bedtime as needed. Uses only if blood sugar levels are over 150            pregabalin (LYRICA) 25 MG capsule Take 1 capsule (25 mg total) by mouth daily at 8 pm.           oxyCODONE (OXY IR/ROXICODONE) 5 MG immediate release tablet Take 1-2 tablets (5-10 mg total) by mouth every 4 (four) hours as needed.            Verbal and written Discharge instructions given to the patient. Wound care per Discharge AVS Follow-up Information    Follow up with Sherren Kerns, MD in 4 weeks.   Contact information:   [address] [place] [address] [phone]        Patient will have dialysis prior to D/C home today. Held until after dialysis and PT will be D/C home today. SignedMosetta Pigeon 03/11/2011, 8:23 AM

## 2011-03-12 LAB — GLUCOSE, CAPILLARY
Glucose-Capillary: 124 mg/dL — ABNORMAL HIGH (ref 70–99)
Glucose-Capillary: 157 mg/dL — ABNORMAL HIGH (ref 70–99)

## 2011-03-12 NOTE — Progress Notes (Signed)
CARE MANAGEMENT NOTE 03/12/2011 11:45AM-   Case Manager received call from [facility], they can not accept [employer]. Spoke with Ella Bodo- pt's daughter, explained and offered choices, will use [facility].

## 2011-03-12 NOTE — Progress Notes (Signed)
[facility] [place]  Subjective  - POD #2 left thigh graft.  Still sore but better than yesterday.  No numbness or tingling in foot  Objective 118/71 83 99.4 F (37.4 C) (Oral) 18 100%  Intake/Output Summary (Last 24 hours) at 03/12/11 0849 Last data filed at 03/11/11 2017  Gross per 24 hour  Intake    360 ml  Output   2304 ml  Net  -1944 ml   Left groin incision healing, audible bruit  Assessment/Planning: POD #2 Left thigh AV graft feels better overall although still sore she thinks she is ready for d/c home  D/C today  FIELDS,CHARLES E 03/12/2011 8:49 AM --  Laboratory Lab Results:  Basename 03/11/11 1158 03/10/11 0644 03/10/11 0637  WBC 6.2 -- 4.4  HGB 11.1* 11.6* --  HCT 34.5* 34.0* --  PLT 110* -- 127*   BMET  Basename 03/11/11 1158 03/10/11 0644 03/10/11 0637  NA 136 136 --  K 5.3* 4.8 --  CL 97 -- 98  CO2 20 -- 20  GLUCOSE 120* 105* --  BUN 74* -- 64*  CREATININE 10.82* -- 9.36*  CALCIUM 9.3 -- 8.9    COAG Lab Results  Component Value Date   INR 0.93 12/23/2010   INR 0.87 12/23/2010   INR 1.2 10/13/2008   No results found for this basename: PTT    Antibiotics Anti-infectives     Start     Dose/Rate Route Frequency Ordered Stop   03/09/11 0945   ceFAZolin (ANCEF) IVPB 1 g/50 mL premix        1 g 100 mL/hr over 30 Minutes Intravenous  Once 03/09/11 0941 03/10/11 0743

## 2011-03-12 NOTE — Progress Notes (Signed)
PT/OT DISCHARGE NOTE:  PT evaluation completed.  Pt is performing at functional level appropriate for discharge to home.     Pt will benefit from HHPT to improve pt mobility and tolerance to activity.   Pt will also benefit fromHome health aide to assist pt when daughter is at school.  Jaylanni Eltringham L. Hansini Clodfelter DPT [phone] 03/12/2011

## 2011-03-12 NOTE — Evaluation (Signed)
Physical Therapy Evaluation Patient Details Name: [name] MRN: [id number] DOB: Mar 25, 1955 Today's Date: 03/12/2011  Problem List:  Patient Active Problem List  Diagnoses  . HEPATITIS C  . HYPOTHYROIDISM  . DIABETES MELLITUS, TYPE II  . ANEMIA, CHRONIC  . HYPERTENSION  . SINUSITIS- ACUTE-NOS  . GERD  . CIRRHOSIS  . End stage renal disease    Past Medical History:  Past Medical History  Diagnosis Date  . Liver disease     cirrhosis and this is why she had the transplant in 2011  . Hepatitis C   . Chronic back pain   . Diverticulosis   . CAD (coronary artery disease)   . Thyroid disease   . Obesity   . Leg pain   . Peripheral vascular disease hands and legs  . Anxiety   . Blood transfusion   . Asthma   . Shortness of breath   . Headache     last migraine 6+yrs ago  . Arthritis     back  . Chronic back pain   . GERD (gastroesophageal reflux disease)     takes Omeprazole daily  . Chronic constipation     takes MIralax and Colace daily  . Oligouria   . Anemia     takes Folic Acid daily  . Hypothyroidism     takes Synthroid daily  . Diabetes mellitus     Levemir 2units daily if > 150  . Depression     takes Cymbalta for "severe" depression  . Pneumonia   . Abdominal pain, bilateral lower quadrant   . Neuromuscular disorder     carpal tunnel in right hand  . Hypertension     takes Metoprolol and Lisinopril daily, sees Dr Shary Decamp  . Chronic kidney disease     Tue, Thur, Sat, [facility], see Dr. Hyman Hopes   Past Surgical History:  Past Surgical History  Procedure Date  . 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 Odette Horns Coord [phone]  . Small intestine surgery 90's  . Thrombectomy   . Arteriovenous graft placement 10/03/10    Left forearm AVG  . Avgg removal 12/23/2010    Procedure: REMOVAL OF ARTERIOVENOUS GORETEX GRAFT (AVGG);  Surgeon: Sherren Kerns, MD;  Location: [facility] OR;  Service:  Vascular;  Laterality: Left;  procedure started @1736 -8657  . Insertion of dialysis catheter 12/23/2010    Procedure: INSERTION OF DIALYSIS CATHETER;  Surgeon: Sherren Kerns, MD;  Location: [facility] OR;  Service: Vascular;  Laterality: Right;  Right Internal Jugular 28cm dialysis catheter insertion procedure time 1701-1720   . Cholecystectomy 1993  . Cystoscopy 15+yrs ago  . Back surgery 2010  . Av fistula placement 01/29/2011    Procedure: INSERTION OF ARTERIOVENOUS (AV) GORE-TEX GRAFT ARM;  Surgeon: Sherren Kerns, MD;  Location: [facility] OR;  Service: Vascular;  Laterality: Right;  . Tubal ligation   . Av fistula placement 03/10/2011    Procedure: INSERTION OF ARTERIOVENOUS (AV) GORE-TEX GRAFT THIGH;  Surgeon: Sherren Kerns, MD;  Location: [facility] OR;  Service: Vascular;  Laterality: Left;    PT Assessment/Plan/Recommendation PT Assessment Clinical Impression Statement: [name] is a pleasant 56 y/o female s/p Lt femoral AV grart insertion.  Pt is limited mostly by pain from surgery.  Today pt requires min assist with bed mobility and has limited strength and ROM in Lt hip due to pain.  All PT needs can be addressed by  HHPT. Acute Pt will be signing off.   PT Recommendation/Assessment: All further PT needs can be met in the next venue of care (HHPT for mobility and HEP progression) PT Problem List: Decreased strength;Decreased range of motion;Decreased activity tolerance;Decreased mobility;Pain PT Therapy Diagnosis : Generalized weakness;Acute pain;Abnormality of gait PT Recommendation Follow Up Recommendations: Home health PT;Supervision for mobility/OOB Equipment Recommended: Tub/shower bench PT Goals  Acute Rehab PT Goals PT Goal Formulation: With patient/family  PT Evaluation Precautions/Restrictions  Precautions Precautions: Fall;Other (comment) Precaution Comments: Pt fell at home about 3 months ago; Lt hip AV graft.  Pt cleared to ambulate by MD. Restrictions Weight Bearing  Restrictions: No Prior Functioning  Home Living Lives With: Daughter Receives Help From: Family (Daughter works full time) Type of Home: House Home Layout: One level Home Access: Stairs to enter Entrance Stairs-Rails: Right Entrance Stairs-Number of Steps: 5 Bathroom Shower/Tub: Forensic scientist: Standard Bathroom Accessibility: No Home Adaptive Equipment: Walker - rolling;Straight cane;Bedside commode/3-in-1 Prior Function Level of Independence: Independent with basic ADLs;Independent with gait;Independent with transfers;Requires assistive device for independence Able to Take Stairs?: Yes Driving: Yes Vocation: On disability Leisure: Hobbies-yes (Comment) Comments: playing with her grandson  Cognition Cognition Arousal/Alertness: Awake/alert Overall Cognitive Status: Appears within functional limits for tasks assessed Orientation Level: Oriented X4 Sensation/Coordination Sensation Light Touch: Appears Intact Stereognosis: Not tested Hot/Cold: Not tested Proprioception: Appears Intact Coordination Gross Motor Movements are Fluid and Coordinated: No Fine Motor Movements are Fluid and Coordinated: Yes Coordination and Movement Description: Lt LE arom limited by pain  Extremity Assessment RUE Assessment RUE Assessment: Within Functional Limits LUE Assessment LUE Assessment: Within Functional Limits RLE Assessment RLE Assessment: Within Functional Limits LLE Assessment LLE Assessment: Exceptions to WFL LLE AROM (degrees) Overall AROM Left Lower Extremity: Deficits;Due to pain LLE Overall AROM Comments: Ankle and foot WFL, Hip and knee limited due to pain.   LLE Strength LLE Overall Strength: Deficits;Due to pain LLE Overall Strength Comments: Ankle and foot WFL, Hip and knee limited due to pain.  Mobility (including Balance) Bed Mobility Bed Mobility: Yes Supine to Sit: 4: Min assist;HOB flat;Patient percentage (comment) (pt 90%) Supine to Sit  Details (indicate cue type and reason): Pt requires assistance to manage Lt LE secondary to pain and weakness. Provided pt with verbal and tactile cues for technique to minimize pain and energy expenditure.  Instructed daughter in proper technique to assist pt.   Transfers Transfers: Yes Sit to Stand: 4: Min assist;From elevated surface;From bed;From chair/3-in-1;With upper extremity assist;With armrests;Patient percentage (comment) (pt 90%Two trials 1 from bed 1 from recliner) Sit to Stand Details (indicate cue type and reason): Pt required min assist to tranfer wt forward due to pain in Lt hip and pain in Rt hand from IV in wrist.  Instructed pt in proper technique to reduce pain in Lt hip including scooting to edge of bed/chair and positioning Lt LE forward for deceased  need for  hip flexion.   Stand to Sit: 5: Supervision;To chair/3-in-1;With armrests;With upper extremity assist (two trials to recliner.  ) Stand to Sit Details: Trial 1:  Verbal cues for hand placement and Lt LE positioning for decreased pain.  Trial 2:  Pt able to verbalize and demonstrate proper technique  (after prompting). Pt able to control descent to chair on both trials despite obvious increase in Lt hip pain.    Ambulation/Gait Ambulation/Gait: Yes Ambulation/Gait Assistance: 5: Supervision Ambulation/Gait Assistance Details (indicate cue type and reason): Provide pt with verbal and tactile cues for proper  use of RW and gait sequencing  (Lt LE lead). [name] gait was excessiviely slow initially but improved when pt prompted to walk as fast as she could.  Pt required repeated verbal cues to "look where you are going" as pt demonstrated flexed cervical posture .  Overall pt gait was steady and safe.   Ambulation Distance (Feet): 50 Feet Assistive device: Rolling walker Gait Pattern: Step-to pattern;Decreased stance time - left;Decreased stride length;Decreased weight shift to left Gait velocity: 38 feet/40sec =   .33ft/sec Stairs: Yes Stairs Assistance: 5: Supervision Stairs Assistance Details (indicate cue type and reason): Pt instructed in proper sequencing of stair negotiation to minimize pain in Lt hip and maximize pt's safety.  Pt had difficulty understanding why she should descend the stairs with the Lt LE first.  Instructed pt to attempt with Rt LE.  Pt noted increase in pain and then understood why the Lt LE should descend first.   Stair Management Technique: One rail Right;Forwards;Other (comment) (Two hands on rt ascending rail. ) Number of Stairs: 5  Height of Stairs: 6  Wheelchair Mobility Wheelchair Mobility: Yes  Posture/Postural Control Posture/Postural Control: Postural limitations Postural Limitations: Repeated verbal cues for upright cervical posture.  Balance Balance Assessed: Yes Static Sitting Balance Static Sitting - Balance Support: Feet supported;No upper extremity supported Static Sitting - Level of Assistance: 7: Independent Static Sitting - Comment/# of Minutes: several minutes sitting on EOB while preparing for transfer training.  Intially pt felt a little dizzy but the dizziness passed quickly.   Static Standing Balance Static Standing - Balance Support: Bilateral upper extremity supported Static Standing - Level of Assistance: 6: Modified independent (Device/Increase time) Static Standing - Comment/# of Minutes: >30 seconds with no LOB  Exercise  Total Joint Exercises Heel Slides: 5 reps;Left;AAROM;Supine End of Session PT - End of Session Equipment Utilized During Treatment: Gait belt Activity Tolerance: Patient tolerated treatment well Patient left: in bed;with family/visitor present Nurse Communication: Mobility status for transfers;Mobility status for ambulation General Behavior During Session: [facility] for tasks performed Cognition: [facility] for tasks performed  Lauriel Helin 03/12/2011, 12:44 PM Gesenia Bantz L. Ona Roehrs DPT [phone]

## 2011-03-12 NOTE — Progress Notes (Signed)
CARE MANAGEMENT NOTE 03/12/2011 Discharge planning. Spoke with patient and her daughter. Choice offered for Home Health. They ave previously used [facility], will do so now. Called and faxed needed info.

## 2011-03-12 NOTE — Progress Notes (Signed)
Pt. discharged to home. Pt after visit summary reviewed with pt and daughter, and pt capable of re verbalizing medications and follow up appointments. Home health to deliver tub chair to house this evening per case manager. Pt remains stable. No signs and symptoms of distress. Educated to return to ER in the event of SOB, dizziness, chest pain, or fainting. Burley Saver, RN

## 2011-03-17 ENCOUNTER — Other Ambulatory Visit: Payer: Self-pay

## 2011-03-17 DIAGNOSIS — G8918 Other acute postprocedural pain: Secondary | ICD-10-CM

## 2011-03-17 MED ORDER — TRAMADOL HCL 50 MG PO TABS: 50.0000 mg | ORAL_TABLET | Freq: Four times a day (QID) | ORAL | Status: AC | PRN

## 2011-03-17 NOTE — Telephone Encounter (Signed)
Requests pain medication refill. S/p left thigh arteriovenous gortex graft insertion of 03/10/11.  States out of Oxycodone 5 mg.  Unable to take tylenol due to liver transplant.  States left thigh is still painful and swollen.  Discussed w/ Dr. Darrick Penna.  rec'd verbal okay to order Tramadol per s.o.  Informed Ella Bodo, daughter that Tramadol will be ordered @ pharmacy.

## 2011-04-09 ENCOUNTER — Emergency Department (HOSPITAL_COMMUNITY): Payer: Self-pay

## 2011-04-09 ENCOUNTER — Encounter (HOSPITAL_COMMUNITY): Payer: Self-pay | Admitting: *Deleted

## 2011-04-09 ENCOUNTER — Inpatient Hospital Stay (HOSPITAL_COMMUNITY)
Admission: EM | Admit: 2011-04-09 | Discharge: 2011-04-12 | DRG: 193 | Disposition: A | Discharge status: Home Health | Payer: Self-pay | Attending: Internal Medicine | Admitting: Internal Medicine

## 2011-04-09 ENCOUNTER — Encounter: Payer: Self-pay | Admitting: Vascular Surgery

## 2011-04-09 ENCOUNTER — Other Ambulatory Visit: Payer: Self-pay

## 2011-04-09 DIAGNOSIS — I2510 Atherosclerotic heart disease of native coronary artery without angina pectoris: Secondary | ICD-10-CM | POA: Diagnosis present

## 2011-04-09 DIAGNOSIS — K219 Gastro-esophageal reflux disease without esophagitis: Secondary | ICD-10-CM | POA: Diagnosis present

## 2011-04-09 DIAGNOSIS — J189 Pneumonia, unspecified organism: Principal | ICD-10-CM | POA: Diagnosis present

## 2011-04-09 DIAGNOSIS — J45909 Unspecified asthma, uncomplicated: Secondary | ICD-10-CM | POA: Diagnosis present

## 2011-04-09 DIAGNOSIS — Y95 Nosocomial condition: Secondary | ICD-10-CM

## 2011-04-09 DIAGNOSIS — E1122 Type 2 diabetes mellitus with diabetic chronic kidney disease: Secondary | ICD-10-CM | POA: Diagnosis present

## 2011-04-09 DIAGNOSIS — E875 Hyperkalemia: Secondary | ICD-10-CM | POA: Diagnosis not present

## 2011-04-09 DIAGNOSIS — K5730 Diverticulosis of large intestine without perforation or abscess without bleeding: Secondary | ICD-10-CM | POA: Diagnosis present

## 2011-04-09 DIAGNOSIS — G8929 Other chronic pain: Secondary | ICD-10-CM | POA: Diagnosis present

## 2011-04-09 DIAGNOSIS — I739 Peripheral vascular disease, unspecified: Secondary | ICD-10-CM | POA: Diagnosis present

## 2011-04-09 DIAGNOSIS — I120 Hypertensive chronic kidney disease with stage 5 chronic kidney disease or end stage renal disease: Secondary | ICD-10-CM | POA: Diagnosis present

## 2011-04-09 DIAGNOSIS — E119 Type 2 diabetes mellitus without complications: Secondary | ICD-10-CM | POA: Diagnosis present

## 2011-04-09 DIAGNOSIS — K5900 Constipation, unspecified: Secondary | ICD-10-CM | POA: Diagnosis present

## 2011-04-09 DIAGNOSIS — M545 Low back pain, unspecified: Secondary | ICD-10-CM | POA: Diagnosis present

## 2011-04-09 DIAGNOSIS — B1920 Unspecified viral hepatitis C without hepatic coma: Secondary | ICD-10-CM | POA: Diagnosis present

## 2011-04-09 DIAGNOSIS — F411 Generalized anxiety disorder: Secondary | ICD-10-CM | POA: Diagnosis present

## 2011-04-09 DIAGNOSIS — Z794 Long term (current) use of insulin: Secondary | ICD-10-CM

## 2011-04-09 DIAGNOSIS — F329 Major depressive disorder, single episode, unspecified: Secondary | ICD-10-CM | POA: Diagnosis present

## 2011-04-09 DIAGNOSIS — D649 Anemia, unspecified: Secondary | ICD-10-CM | POA: Diagnosis present

## 2011-04-09 DIAGNOSIS — F3289 Other specified depressive episodes: Secondary | ICD-10-CM | POA: Diagnosis present

## 2011-04-09 DIAGNOSIS — Z7982 Long term (current) use of aspirin: Secondary | ICD-10-CM

## 2011-04-09 DIAGNOSIS — M129 Arthropathy, unspecified: Secondary | ICD-10-CM | POA: Diagnosis present

## 2011-04-09 DIAGNOSIS — D696 Thrombocytopenia, unspecified: Secondary | ICD-10-CM | POA: Diagnosis present

## 2011-04-09 DIAGNOSIS — R0902 Hypoxemia: Secondary | ICD-10-CM | POA: Diagnosis present

## 2011-04-09 DIAGNOSIS — M948X9 Other specified disorders of cartilage, unspecified sites: Secondary | ICD-10-CM | POA: Diagnosis present

## 2011-04-09 DIAGNOSIS — F17200 Nicotine dependence, unspecified, uncomplicated: Secondary | ICD-10-CM | POA: Diagnosis present

## 2011-04-09 DIAGNOSIS — N186 End stage renal disease: Secondary | ICD-10-CM | POA: Diagnosis present

## 2011-04-09 DIAGNOSIS — D631 Anemia in chronic kidney disease: Secondary | ICD-10-CM | POA: Diagnosis present

## 2011-04-09 DIAGNOSIS — Z944 Liver transplant status: Secondary | ICD-10-CM

## 2011-04-09 DIAGNOSIS — E039 Hypothyroidism, unspecified: Secondary | ICD-10-CM | POA: Diagnosis present

## 2011-04-09 DIAGNOSIS — I119 Hypertensive heart disease without heart failure: Secondary | ICD-10-CM | POA: Diagnosis present

## 2011-04-09 DIAGNOSIS — Z992 Dependence on renal dialysis: Secondary | ICD-10-CM

## 2011-04-09 LAB — GLUCOSE, CAPILLARY: Glucose-Capillary: 128 mg/dL — ABNORMAL HIGH (ref 70–99)

## 2011-04-09 LAB — DIFFERENTIAL
Basophils Relative: 0 % (ref 0–1)
Lymphocytes Relative: 19 % (ref 12–46)
Lymphs Abs: 1.5 10*3/uL (ref 0.7–4.0)
Monocytes Absolute: 0.9 10*3/uL (ref 0.1–1.0)
Monocytes Relative: 12 % (ref 3–12)
Neutro Abs: 5.2 10*3/uL (ref 1.7–7.7)

## 2011-04-09 LAB — CARDIAC PANEL(CRET KIN+CKTOT+MB+TROPI)
CK, MB: 2.2 ng/mL (ref 0.3–4.0)
Relative Index: INVALID (ref 0.0–2.5)
Total CK: 39 U/L (ref 7–177)

## 2011-04-09 LAB — BASIC METABOLIC PANEL
CO2: 17 mEq/L — ABNORMAL LOW (ref 19–32)
Chloride: 103 mEq/L (ref 96–112)
Glucose, Bld: 131 mg/dL — ABNORMAL HIGH (ref 70–99)
Potassium: 5.8 mEq/L — ABNORMAL HIGH (ref 3.5–5.1)
Sodium: 138 mEq/L (ref 135–145)

## 2011-04-09 LAB — HEPATIC FUNCTION PANEL
ALT: 96 U/L — ABNORMAL HIGH (ref 0–35)
Alkaline Phosphatase: 225 U/L — ABNORMAL HIGH (ref 39–117)
Bilirubin, Direct: 0.3 mg/dL (ref 0.0–0.3)
Indirect Bilirubin: 0.3 mg/dL (ref 0.3–0.9)
Total Protein: 6.9 g/dL (ref 6.0–8.3)

## 2011-04-09 LAB — CBC
HCT: 38.3 % (ref 36.0–46.0)
Hemoglobin: 12.3 g/dL (ref 12.0–15.0)
MCHC: 32.1 g/dL (ref 30.0–36.0)
RBC: 4.19 MIL/uL (ref 3.87–5.11)

## 2011-04-09 LAB — PROCALCITONIN: Procalcitonin: 0.23 ng/mL

## 2011-04-09 MED ORDER — PANTOPRAZOLE SODIUM 40 MG PO TBEC
40.0000 mg | DELAYED_RELEASE_TABLET | Freq: Every day | ORAL | Status: DC
  Administered 2011-04-10 – 2011-04-12 (×3): 40 mg via ORAL
  Filled 2011-04-09 (×3): qty 1

## 2011-04-09 MED ORDER — CALCIUM ACETATE (PHOS BINDER) 667 MG/5ML PO SOLN
2668.0000 mg | Freq: Three times a day (TID) | ORAL | Status: DC
  Administered 2011-04-10 – 2011-04-12 (×6): 2668 mg via ORAL
  Filled 2011-04-09 (×11): qty 20

## 2011-04-09 MED ORDER — CALCIUM CARBONATE ANTACID 500 MG PO CHEW
1.0000 | CHEWABLE_TABLET | Freq: Every day | ORAL | Status: DC
  Administered 2011-04-10 – 2011-04-11 (×2): 200 mg via ORAL
  Filled 2011-04-09 (×3): qty 1

## 2011-04-09 MED ORDER — PENTAFLUOROPROP-TETRAFLUOROETH EX AERO
1.0000 "application " | INHALATION_SPRAY | CUTANEOUS | Status: DC | PRN
  Filled 2011-04-09: qty 103.5

## 2011-04-09 MED ORDER — VANCOMYCIN HCL 10 G IV SOLR
1.0000 g | Freq: Once | INTRAVENOUS | Status: DC
Start: 2011-04-09 — End: 2011-04-09

## 2011-04-09 MED ORDER — CAMPHOR-MENTHOL 0.5-0.5 % EX LOTN
1.0000 "application " | TOPICAL_LOTION | Freq: Three times a day (TID) | CUTANEOUS | Status: DC | PRN
  Filled 2011-04-09: qty 222

## 2011-04-09 MED ORDER — HYDROXYZINE HCL 25 MG PO TABS: 25.0000 mg | ORAL_TABLET | Freq: Three times a day (TID) | ORAL | Status: DC | PRN

## 2011-04-09 MED ORDER — ACETAMINOPHEN 650 MG RE SUPP: 650.0000 mg | Freq: Four times a day (QID) | RECTAL | Status: DC | PRN

## 2011-04-09 MED ORDER — SODIUM CHLORIDE 0.9 % IJ SOLN
3.0000 mL | INTRAMUSCULAR | Status: DC | PRN
  Administered 2011-04-11: 3 mL via INTRAVENOUS

## 2011-04-09 MED ORDER — ALBUTEROL SULFATE (5 MG/ML) 0.5% IN NEBU: 2.5000 mg | INHALATION_SOLUTION | RESPIRATORY_TRACT | Status: DC | PRN

## 2011-04-09 MED ORDER — LEVOTHYROXINE SODIUM 200 MCG PO TABS
200.0000 ug | ORAL_TABLET | Freq: Every day | ORAL | Status: DC
  Administered 2011-04-10 – 2011-04-12 (×3): 200 ug via ORAL
  Filled 2011-04-09 (×4): qty 1

## 2011-04-09 MED ORDER — ALBUTEROL SULFATE HFA 108 (90 BASE) MCG/ACT IN AERS
2.0000 | INHALATION_SPRAY | RESPIRATORY_TRACT | Status: DC | PRN
  Filled 2011-04-09: qty 6.7

## 2011-04-09 MED ORDER — TRAMADOL HCL 50 MG PO TABS
50.0000 mg | ORAL_TABLET | Freq: Four times a day (QID) | ORAL | Status: DC | PRN
  Filled 2011-04-09: qty 1

## 2011-04-09 MED ORDER — ACETAMINOPHEN 325 MG PO TABS
ORAL_TABLET | ORAL | Status: AC
  Administered 2011-04-09: 650 mg
  Filled 2011-04-09: qty 2

## 2011-04-09 MED ORDER — SUCRALFATE 1 G PO TABS
1.0000 g | ORAL_TABLET | Freq: Two times a day (BID) | ORAL | Status: DC
  Administered 2011-04-09 – 2011-04-12 (×8): 1 g via ORAL
  Filled 2011-04-09 (×7): qty 1

## 2011-04-09 MED ORDER — VANCOMYCIN HCL 500 MG IV SOLR
500.0000 mg | INTRAVENOUS | Status: DC
  Administered 2011-04-10 – 2011-04-12 (×2): 500 mg via INTRAVENOUS
  Filled 2011-04-09 (×4): qty 500

## 2011-04-09 MED ORDER — LIDOCAINE-PRILOCAINE 2.5-2.5 % EX CREA: 1.0000 "application " | TOPICAL_CREAM | CUTANEOUS | Status: DC | PRN

## 2011-04-09 MED ORDER — ALTEPLASE 2 MG IJ SOLR
2.0000 mg | Freq: Once | INTRAMUSCULAR | Status: AC | PRN
  Filled 2011-04-09: qty 2

## 2011-04-09 MED ORDER — LISINOPRIL 20 MG PO TABS
20.0000 mg | ORAL_TABLET | Freq: Every day | ORAL | Status: DC
  Administered 2011-04-09 – 2011-04-11 (×3): 20 mg via ORAL
  Filled 2011-04-09 (×5): qty 1

## 2011-04-09 MED ORDER — ONDANSETRON HCL 4 MG/2ML IJ SOLN
4.0000 mg | Freq: Once | INTRAMUSCULAR | Status: AC
  Administered 2011-04-09: 4 mg via INTRAVENOUS
  Filled 2011-04-09: qty 2

## 2011-04-09 MED ORDER — DOCUSATE SODIUM 283 MG RE ENEM
1.0000 | ENEMA | RECTAL | Status: DC | PRN
  Filled 2011-04-09: qty 1

## 2011-04-09 MED ORDER — RENA-VITE PO TABS
1.0000 | ORAL_TABLET | Freq: Every day | ORAL | Status: DC
  Administered 2011-04-09 – 2011-04-11 (×3): 1 via ORAL
  Filled 2011-04-09 (×5): qty 1

## 2011-04-09 MED ORDER — ACETAMINOPHEN 325 MG PO TABS: 650.0000 mg | ORAL_TABLET | Freq: Four times a day (QID) | ORAL | Status: DC | PRN

## 2011-04-09 MED ORDER — PROMETHAZINE HCL 25 MG PO TABS: 12.5000 mg | ORAL_TABLET | Freq: Four times a day (QID) | ORAL | Status: DC | PRN

## 2011-04-09 MED ORDER — SODIUM CHLORIDE 0.9 % IV SOLN: 100.0000 mL | INTRAVENOUS | Status: DC | PRN

## 2011-04-09 MED ORDER — PIPERACILLIN-TAZOBACTAM 3.375 G IVPB 30 MIN
2.2500 g | Freq: Three times a day (TID) | INTRAVENOUS | Status: DC
  Filled 2011-04-09 (×2): qty 50

## 2011-04-09 MED ORDER — CALCIUM CARBONATE 1250 MG/5ML PO SUSP
500.0000 mg | Freq: Four times a day (QID) | ORAL | Status: DC | PRN
  Filled 2011-04-09: qty 5

## 2011-04-09 MED ORDER — TACROLIMUS 1 MG PO CAPS
5.0000 mg | ORAL_CAPSULE | Freq: Two times a day (BID) | ORAL | Status: DC
  Administered 2011-04-09 – 2011-04-12 (×6): 5 mg via ORAL
  Filled 2011-04-09 (×7): qty 5

## 2011-04-09 MED ORDER — POLYETHYLENE GLYCOL 3350 17 G PO PACK
17.0000 g | PACK | Freq: Two times a day (BID) | ORAL | Status: DC
  Administered 2011-04-09 – 2011-04-11 (×5): 17 g via ORAL
  Filled 2011-04-09 (×7): qty 1

## 2011-04-09 MED ORDER — ONDANSETRON HCL 4 MG PO TABS: 4.0000 mg | ORAL_TABLET | Freq: Four times a day (QID) | ORAL | Status: DC | PRN

## 2011-04-09 MED ORDER — ALBUTEROL 90 MCG/ACT IN AERS: 2.0000 | INHALATION_SPRAY | RESPIRATORY_TRACT | Status: DC | PRN

## 2011-04-09 MED ORDER — ONDANSETRON HCL 4 MG/2ML IJ SOLN
4.0000 mg | Freq: Four times a day (QID) | INTRAMUSCULAR | Status: DC | PRN
  Administered 2011-04-11 (×2): 4 mg via INTRAVENOUS
  Filled 2011-04-09 (×2): qty 2

## 2011-04-09 MED ORDER — HEPARIN SODIUM (PORCINE) 1000 UNIT/ML DIALYSIS
20.0000 [IU]/kg | INTRAMUSCULAR | Status: DC | PRN
  Administered 2011-04-09: 1200 [IU] via INTRAVENOUS_CENTRAL
  Filled 2011-04-09: qty 2

## 2011-04-09 MED ORDER — DULOXETINE HCL 60 MG PO CPEP
60.0000 mg | ORAL_CAPSULE | Freq: Every day | ORAL | Status: DC
  Administered 2011-04-09 – 2011-04-12 (×4): 60 mg via ORAL
  Filled 2011-04-09 (×4): qty 1

## 2011-04-09 MED ORDER — PIPERACILLIN-TAZOBACTAM IN DEX 2-0.25 GM/50ML IV SOLN
2.2500 g | Freq: Three times a day (TID) | INTRAVENOUS | Status: DC
  Filled 2011-04-09 (×3): qty 50

## 2011-04-09 MED ORDER — METOPROLOL SUCCINATE ER 25 MG PO TB24
25.0000 mg | ORAL_TABLET | Freq: Two times a day (BID) | ORAL | Status: DC
  Administered 2011-04-09 – 2011-04-10 (×2): 25 mg via ORAL
  Filled 2011-04-09 (×3): qty 1

## 2011-04-09 MED ORDER — IBUPROFEN 400 MG PO TABS
400.0000 mg | ORAL_TABLET | Freq: Once | ORAL | Status: AC
  Administered 2011-04-09: 400 mg via ORAL
  Filled 2011-04-09: qty 1

## 2011-04-09 MED ORDER — DOCUSATE SODIUM 100 MG PO CAPS
100.0000 mg | ORAL_CAPSULE | Freq: Two times a day (BID) | ORAL | Status: DC
  Administered 2011-04-09 – 2011-04-11 (×5): 100 mg via ORAL
  Filled 2011-04-09 (×7): qty 1

## 2011-04-09 MED ORDER — SODIUM CHLORIDE 0.9 % IV SOLN: 250.0000 mL | INTRAVENOUS | Status: DC | PRN

## 2011-04-09 MED ORDER — GABAPENTIN 600 MG PO TABS
600.0000 mg | ORAL_TABLET | Freq: Every day | ORAL | Status: DC
  Filled 2011-04-09: qty 1

## 2011-04-09 MED ORDER — SODIUM CHLORIDE 0.9 % IV SOLN
62.5000 mg | INTRAVENOUS | Status: DC
  Administered 2011-04-11: 62.5 mg via INTRAVENOUS
  Filled 2011-04-09 (×2): qty 5

## 2011-04-09 MED ORDER — ONDANSETRON 4 MG PO TBDP
4.0000 mg | ORAL_TABLET | Freq: Three times a day (TID) | ORAL | Status: DC | PRN
  Filled 2011-04-09: qty 1

## 2011-04-09 MED ORDER — FOLIC ACID 1 MG PO TABS
1.0000 mg | ORAL_TABLET | Freq: Every day | ORAL | Status: DC
  Administered 2011-04-10 – 2011-04-12 (×3): 1 mg via ORAL
  Filled 2011-04-09 (×3): qty 1

## 2011-04-09 MED ORDER — HEPARIN SODIUM (PORCINE) 1000 UNIT/ML DIALYSIS
1000.0000 [IU] | INTRAMUSCULAR | Status: DC | PRN
  Administered 2011-04-12: 1000 [IU] via INTRAVENOUS_CENTRAL
  Filled 2011-04-09: qty 1

## 2011-04-09 MED ORDER — CIPROFLOXACIN IN D5W 400 MG/200ML IV SOLN
400.0000 mg | Freq: Once | INTRAVENOUS | Status: AC
  Administered 2011-04-09: 400 mg via INTRAVENOUS
  Filled 2011-04-09: qty 200

## 2011-04-09 MED ORDER — LIDOCAINE HCL (PF) 1 % IJ SOLN: 5.0000 mL | INTRAMUSCULAR | Status: DC | PRN

## 2011-04-09 MED ORDER — ZOLPIDEM TARTRATE 5 MG PO TABS: 5.0000 mg | ORAL_TABLET | Freq: Every evening | ORAL | Status: DC | PRN

## 2011-04-09 MED ORDER — ASPIRIN EC 81 MG PO TBEC
81.0000 mg | DELAYED_RELEASE_TABLET | Freq: Every day | ORAL | Status: DC
  Administered 2011-04-09 – 2011-04-12 (×4): 81 mg via ORAL
  Filled 2011-04-09 (×4): qty 1

## 2011-04-09 MED ORDER — SENNOSIDES 8.6 MG PO TABS: 1.0000 | ORAL_TABLET | Freq: Every evening | ORAL | Status: DC

## 2011-04-09 MED ORDER — PIPERACILLIN-TAZOBACTAM IN DEX 2-0.25 GM/50ML IV SOLN
2.2500 g | Freq: Three times a day (TID) | INTRAVENOUS | Status: DC
  Administered 2011-04-09 – 2011-04-12 (×8): 2.25 g via INTRAVENOUS
  Filled 2011-04-09 (×12): qty 50

## 2011-04-09 MED ORDER — SODIUM CHLORIDE 0.9 % IJ SOLN
3.0000 mL | Freq: Two times a day (BID) | INTRAMUSCULAR | Status: DC
  Administered 2011-04-09 – 2011-04-11 (×5): 3 mL via INTRAVENOUS

## 2011-04-09 MED ORDER — HEPARIN SODIUM (PORCINE) 5000 UNIT/ML IJ SOLN
5000.0000 [IU] | Freq: Three times a day (TID) | INTRAMUSCULAR | Status: DC
  Administered 2011-04-09 – 2011-04-12 (×8): 5000 [IU] via SUBCUTANEOUS
  Filled 2011-04-09 (×11): qty 1

## 2011-04-09 MED ORDER — SENNA 8.6 MG PO TABS
1.0000 | ORAL_TABLET | Freq: Every day | ORAL | Status: DC
  Administered 2011-04-09 – 2011-04-11 (×3): 8.6 mg via ORAL
  Filled 2011-04-09 (×4): qty 1

## 2011-04-09 MED ORDER — SORBITOL 70 % SOLN
30.0000 mL | Status: DC | PRN
  Filled 2011-04-09: qty 30

## 2011-04-09 MED ORDER — NEPRO/CARBSTEADY PO LIQD
237.0000 mL | Freq: Three times a day (TID) | ORAL | Status: DC | PRN
  Filled 2011-04-09: qty 237

## 2011-04-09 MED ORDER — ALBUTEROL SULFATE (5 MG/ML) 0.5% IN NEBU
2.5000 mg | INHALATION_SOLUTION | Freq: Four times a day (QID) | RESPIRATORY_TRACT | Status: DC
  Administered 2011-04-09: 2.5 mg via RESPIRATORY_TRACT
  Filled 2011-04-09: qty 0.5

## 2011-04-09 MED ORDER — VANCOMYCIN HCL 1000 MG IV SOLR
1250.0000 mg | Freq: Once | INTRAVENOUS | Status: AC
  Administered 2011-04-09: 1250 mg via INTRAVENOUS
  Filled 2011-04-09: qty 1250

## 2011-04-09 NOTE — ED Notes (Signed)
Dr. Dub Mikes drew labs when placing EJ.

## 2011-04-09 NOTE — ED Notes (Signed)
Patient returned from xray.

## 2011-04-09 NOTE — ED Notes (Signed)
Patient transported to x-ray. ?

## 2011-04-09 NOTE — ED Notes (Addendum)
Patient states she has been SOB and weak x 3 days. Patient states her chest hurts when she breaths and nausea with headache. Patient denies vomiting. Patient has a port in right upper chest for dialysis and has her Tuesday, Thursday and Saturday. Patients last treatment was last Saturday. Patient is diabetic. Patient placed on monitor and 2L oxygen with sats of 100%. 12 Lead captured and patient continues to have SOB at this time.

## 2011-04-09 NOTE — ED Provider Notes (Addendum)
History     CSN: [id number]  Arrival date & time 04/09/11  0703   First MD Initiated Contact with Patient 04/09/11 0730      Chief Complaint  Patient presents with  . Shortness of Breath  . Fever    (Consider location/radiation/quality/duration/timing/severity/associated sxs/prior treatment) HPI Comments: Patient presents today complaining of worsening shortness of breath since yesterday.  Patient states that she has had some cough and some subjective fevers.  She is on hemodialysis normally on Tuesday, Thursday, Saturday.  Her last dialysis was last Saturday.  She missed yesterday because her sister was in the emergency department and she was with her.  She was going this morning to make up for yesterday's missed treatment and was found to be short of breath, febrile and hypoxic and was sent here.  Patient states that she is more short of breath while lying down.  She has not noted any increase in leg swelling.  She denies any specific chest pain.  She gets her dialysis currently through a right chest permacath.  Patient is a 56 y.o. female presenting with shortness of breath and fever. The history is provided by the patient. No language interpreter was used.  Shortness of Breath  The current episode started yesterday. The onset was gradual. The problem has been gradually worsening. The problem is moderate. The symptoms are relieved by nothing. The symptoms are aggravated by nothing. Associated symptoms include orthopnea, a fever, cough and shortness of breath. Pertinent negatives include no chest pain, no chest pressure, no rhinorrhea, no sore throat, no stridor and no wheezing.  Fever Primary symptoms of the febrile illness include fever, cough and shortness of breath. Primary symptoms do not include headaches, wheezing, abdominal pain, nausea, vomiting, diarrhea, dysuria or rash.    Past Medical History  Diagnosis Date  . Liver disease     cirrhosis and this is why she had the  transplant in 2011  . Hepatitis C   . Chronic back pain   . Diverticulosis   . CAD (coronary artery disease)   . Thyroid disease   . Obesity   . Leg pain   . Peripheral vascular disease hands and legs  . Anxiety   . Blood transfusion   . Asthma   . Shortness of breath   . Headache     last migraine 6+yrs ago  . Arthritis     back  . Chronic back pain   . GERD (gastroesophageal reflux disease)     takes Omeprazole daily  . Chronic constipation     takes MIralax and Colace daily  . Oligouria   . Anemia     takes Folic Acid daily  . Hypothyroidism     takes Synthroid daily  . Diabetes mellitus     Levemir 2units daily if > 150  . Depression     takes Cymbalta for "severe" depression  . Pneumonia   . Abdominal  pain, other specified site   . Neuromuscular disorder     carpal tunnel in right hand  . Hypertension     takes Metoprolol and Lisinopril daily, sees Dr Shary Decamp  . Chronic kidney disease     Tue, Thur, Sat, [facility], see Dr. Hyman Hopes    Past Surgical History  Procedure Date  . 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 Odette Horns Coord [phone]  . Small intestine surgery 90's  . Thrombectomy   . Arteriovenous  graft placement 10/03/10    Left forearm AVG  . Avgg removal 12/23/2010    Procedure: REMOVAL OF ARTERIOVENOUS GORETEX GRAFT (AVGG);  Surgeon: Sherren Kerns, MD;  Location: [facility] OR;  Service: Vascular;  Laterality: Left;  procedure started @1736 -1852  . Insertion of dialysis catheter 12/23/2010    Procedure: INSERTION OF DIALYSIS CATHETER;  Surgeon: Sherren Kerns, MD;  Location: [facility] OR;  Service: Vascular;  Laterality: Right;  Right Internal Jugular 28cm dialysis catheter insertion procedure time 1701-1720   . Cholecystectomy 1993  . Cystoscopy 15+yrs ago  . Back surgery 2010  . Av fistula placement 01/29/2011    Procedure: INSERTION OF ARTERIOVENOUS (AV) GORE-TEX GRAFT ARM;  Surgeon: Sherren Kerns, MD;  Location: [facility] OR;  Service: Vascular;  Laterality: Right;  . Tubal ligation   . Av fistula placement 03/10/2011    Procedure: INSERTION OF ARTERIOVENOUS (AV) GORE-TEX GRAFT THIGH;  Surgeon: Sherren Kerns, MD;  Location: [facility] OR;  Service: Vascular;  Laterality: Left;    Family History  Problem Relation Age of Onset  . Cancer Mother   . Diabetes Mother   . Hypertension Mother   . Stroke Mother   . Cancer Father   . Anesthesia problems Neg Hx   . Hypotension Neg Hx   . Malignant hyperthermia Neg Hx   . Pseudochol deficiency Neg Hx     History  Substance Use Topics  . Smoking status: Current Everyday Smoker -- 0.2 packs/day for 30 years    Types: Cigarettes  . Smokeless tobacco: Never Used  . Alcohol Use: No    OB History    Grav Para Term Preterm Abortions TAB SAB Ect Mult Living                  Review of Systems  Constitutional: Positive for fever. Negative for chills.  HENT: Negative.  Negative for sore throat and rhinorrhea.   Eyes: Negative.  Negative for discharge and redness.  Respiratory: Positive for cough and shortness of breath. Negative for wheezing and stridor.   Cardiovascular: Positive for orthopnea. Negative for chest pain.  Gastrointestinal: Negative.  Negative for nausea, vomiting, abdominal pain and diarrhea.  Genitourinary: Negative.  Negative for dysuria and vaginal discharge.  Musculoskeletal: Negative.  Negative for back pain.  Skin: Negative.  Negative for color change and rash.  Neurological: Negative.  Negative for syncope and headaches.  Hematological: Negative.  Negative for adenopathy.  Psychiatric/Behavioral: Negative.  Negative for confusion.  All other systems reviewed and are negative.    Allergies  Acetaminophen and Codeine  Home Medications   Current Outpatient Rx  Name Route Sig Dispense Refill  . ASPIRIN EC 81 MG PO TBEC Oral Take 81 mg by mouth daily.      [name] CALCIUM CARBONATE ANTACID 500 MG PO CHEW Oral Chew 1  tablet by mouth daily.     [name] DOCUSATE SODIUM 100 MG PO CAPS Oral Take 100 mg by mouth 2 (two) times daily.      . DULOXETINE HCL 60 MG PO CPEP Oral Take 60 mg by mouth daily.      [name] FOLIC ACID PO Oral Take 0.4 mg by mouth daily.      [name] GABAPENTIN 600 MG PO TABS Oral Take 600 mg by mouth at bedtime.     . INSULIN DETEMIR 100 UNIT/ML  SOLN Subcutaneous Inject 2 Units into the skin at bedtime as needed. Uses only if blood sugar levels are over 150     .  LEVOTHYROXINE SODIUM 200 MCG PO TABS Oral Take 200 mcg by mouth daily.      [name] LISINOPRIL 20 MG PO TABS Oral Take 20 mg by mouth at bedtime.      [name] METOPROLOL SUCCINATE ER 25 MG PO TB24 Oral Take 25 mg by mouth 2 (two) times daily. Take once daily on dialysis days, Tuesday, Thursday and Saturday    . OMEPRAZOLE 20 MG PO CPDR Oral Take 20 mg by mouth daily.      [name] ONDANSETRON 4 MG PO TBDP Oral Take 4 mg by mouth every 8 (eight) hours as needed. nausea    . POLYETHYLENE GLYCOL 3350 PO PACK Oral Take 17 g by mouth 2 (two) times daily. For constipation    . PROMETHAZINE HCL 12.5 MG PO TABS Oral Take 12.5 mg by mouth every 6 (six) hours as needed. For nausea     . SENNOSIDES 8.6 MG PO TABS Oral Take 1 tablet by mouth every evening.     . SUCRALFATE 1 G PO TABS Oral Take 1 g by mouth 2 (two) times daily.      [name] TACROLIMUS 1 MG PO CAPS Oral Take 5 mg by mouth 2 (two) times daily.     . TRAMADOL HCL 50 MG PO TABS Oral Take 50 mg by mouth every 6 (six) hours as needed. pain    . ALBUTEROL 90 MCG/ACT IN AERS Inhalation Inhale 2 puffs into the lungs every 4 (four) hours as needed. For shortness of breath      BP 168/87  Pulse 88  Temp 99.5 F (37.5 C)  Resp 33  SpO2 98%  Physical Exam  Nursing note and vitals reviewed. Constitutional: She is oriented to person, place, and time. She appears well-developed and well-nourished.  Non-toxic appearance. She does not have a sickly appearance.  HENT:  Head: Normocephalic and atraumatic.  Eyes: Conjunctivae,  EOM and lids are normal. Pupils are equal, round, and reactive to light. No scleral icterus.  Neck: Trachea normal and normal range of motion. Neck supple.  Cardiovascular: Normal rate, regular rhythm and normal heart sounds.  Exam reveals no gallop and no friction rub.   No murmur heard. Pulmonary/Chest: She has no wheezes. She has no rales. She exhibits no tenderness.       Patient is taking more shallow breaths the breath sounds do appear clear and without wheezing.  They are somewhat diminished in the bases due to the patient's shallower respirations.  Abdominal: Soft. Normal appearance. There is no tenderness. There is no rebound, no guarding and no CVA tenderness.  Musculoskeletal: Normal range of motion.  Neurological: She is alert and oriented to person, place, and time. She has normal strength.  Skin: Skin is warm, dry and intact. No rash noted.  Psychiatric: She has a normal mood and affect. Her behavior is normal. Judgment and thought content normal.    ED Course  Procedures (including critical care time)  Results for orders placed during the hospital encounter of 04/09/11  CBC      Component Value Range   WBC 7.7  4.0 - 10.5 (K/uL)   RBC 4.19  3.87 - 5.11 (MIL/uL)   Hemoglobin 12.3  12.0 - 15.0 (g/dL)   HCT 16.1  09.6 - 04.5 (%)   MCV 91.4  78.0 - 100.0 (fL)   MCH 29.4  26.0 - 34.0 (pg)   MCHC 32.1  30.0 - 36.0 (g/dL)   RDW 40.9 (*) 81.1 - 15.5 (%)  Platelets 112 (*) 150 - 400 (K/uL)  DIFFERENTIAL      Component Value Range   Neutrophils Relative 68  43 - 77 (%)   Neutro Abs 5.2  1.7 - 7.7 (K/uL)   Lymphocytes Relative 19  12 - 46 (%)   Lymphs Abs 1.5  0.7 - 4.0 (K/uL)   Monocytes Relative 12  3 - 12 (%)   Monocytes Absolute 0.9  0.1 - 1.0 (K/uL)   Eosinophils Relative 1  0 - 5 (%)   Eosinophils Absolute 0.1  0.0 - 0.7 (K/uL)   Basophils Relative 0  0 - 1 (%)   Basophils Absolute 0.0  0.0 - 0.1 (K/uL)   WBC Morphology ATYPICAL LYMPHOCYTES     RBC Morphology  POLYCHROMASIA PRESENT    BASIC METABOLIC PANEL      Component Value Range   Sodium 138  135 - 145 (mEq/L)   Potassium 5.8 (*) 3.5 - 5.1 (mEq/L)   Chloride 103  96 - 112 (mEq/L)   CO2 17 (*) 19 - 32 (mEq/L)   Glucose, Bld 131 (*) 70 - 99 (mg/dL)   BUN 62 (*) 6 - 23 (mg/dL)   Creatinine, Ser 4.54 (*) 0.50 - 1.10 (mg/dL)   Calcium 9.3  8.4 - 09.8 (mg/dL)   GFR calc non Af Amer 4 (*) >90 (mL/min)   GFR calc Af Amer 5 (*) >90 (mL/min)  LACTIC ACID, PLASMA      Component Value Range   Lactic Acid, Venous 1.2  0.5 - 2.2 (mmol/L)  PROCALCITONIN      Component Value Range   Procalcitonin 0.23    CARDIAC PANEL(CRET KIN+CKTOT+MB+TROPI)      Component Value Range   Total CK 39  7 - 177 (U/L)   CK, MB 2.2  0.3 - 4.0 (ng/mL)   Troponin I <0.30  <0.30 (ng/mL)   Relative Index RELATIVE INDEX IS INVALID  0.0 - 2.5   HEPATIC FUNCTION PANEL      Component Value Range   Total Protein 6.9  6.0 - 8.3 (g/dL)   Albumin 2.8 (*) 3.5 - 5.2 (g/dL)   AST 86 (*) 0 - 37 (U/L)   ALT 96 (*) 0 - 35 (U/L)   Alkaline Phosphatase 225 (*) 39 - 117 (U/L)   Total Bilirubin 0.6  0.3 - 1.2 (mg/dL)   Bilirubin, Direct 0.3  0.0 - 0.3 (mg/dL)   Indirect Bilirubin 0.3  0.3 - 0.9 (mg/dL)  GLUCOSE, CAPILLARY      Component Value Range   Glucose-Capillary 128 (*) 70 - 99 (mg/dL)   Comment 1 Documented in Chart     Comment 2 Notify RN     Dg Chest 2 View  04/09/2011  *RADIOLOGY REPORT*  Clinical Data: Shortness of breath and chills.  CHEST - 2 VIEW  Comparison: 12/23/2010.  Findings: Trachea is midline.  Heart size normal.  Right IJ dialysis catheter tips project over the SVC and right atrium, stable.  There is bibasilar air space disease and small bilateral pleural effusions.  IMPRESSION: Bibasilar air space disease and small bilateral pleural effusions.  Original Report Authenticated By: Reyes Ivan, M.D.      Date: 04/09/2011  Rate: 85  Rhythm: normal sinus rhythm  QRS Axis: normal  Intervals: normal  ST/T  Wave abnormalities: T wave inversions in lateral leads  Conduction Disutrbances:none  Narrative Interpretation:   Old EKG Reviewed: unchanged from 12/23/10  Angiocath insertion Performed by: Emeline General A  Consent: Verbal consent obtained.  Risks and benefits: risks, benefits and alternatives were discussed Time out: Immediately prior to procedure a "time out" was called to verify the correct patient, procedure, equipment, support staff and site/side marked as required.  Preparation: Patient was prepped and draped in the usual sterile fashion.  Vein Location: Left external jugular   Gauge: 18  Normal blood return and flush without difficulty Patient tolerance: Patient tolerated the procedure well with no immediate complications.     MDM  Patient with fevers, cough and hypoxia at dialysis they could be explained by pneumonia that is seen on her chest x-ray today.  Given that the patient is on hemodialysis the patient will be given antibiotics to cover for hospital-acquired pneumonia.  Patient has otherwise been hemodynamically stable here.  I did establish an 18-gauge IV in the patient's left EJ do to difficulty obtaining other peripheral access on this patient due to her long history of diabetes and dialysis.  Patient will report her admission for further treatment for hospital-acquired pneumonia at this time.        Nat Christen, MD 04/09/11 1116  Pt discussed with [facility] for admission for HAP.  Will page nephro to make aware of pt's need for dialysis.    Nat Christen, MD 04/09/11 1151

## 2011-04-09 NOTE — ED Notes (Signed)
Patient remains on monitor and 2L oxygen w/ sats of 94%. Patient sleeping with NAD at this time.

## 2011-04-09 NOTE — ED Notes (Signed)
Reports waking up this am with sob, went to dialysis but had fever and low spo2 and sent here for further eval.

## 2011-04-09 NOTE — ED Notes (Signed)
[id number] Ready 

## 2011-04-09 NOTE — Progress Notes (Signed)
ANTIBIOTIC CONSULT NOTE - INITIAL  Pharmacy Consult for vanc Indication: PNA  Allergies  Allergen Reactions  . Acetaminophen Other (See Comments)    Liver problems  . Codeine Itching    Patient Measurements: Height: 5\' 4"  (162.6 cm) Weight: 138 lb 3.7 oz (62.7 kg) IBW/kg (Calculated) : 54.7   Vital Signs: Temp: 101.3 F (38.5 C) (02/27 1730) Temp src: Oral (02/27 1730) BP: 150/68 mmHg (02/27 1730) Pulse Rate: 94  (02/27 1730) Intake/Output from previous day:   Intake/Output from this shift: Total I/O In: -  Out: 4944 [Other:4944]  Labs:  [facility] 04/09/11 0750  WBC 7.7  HGB 12.3  PLT 112*  LABCREA --  CREATININE 9.50*     Microbiology: No results found for this or any previous visit (from the past 720 hour(s)).  Medical History: Past Medical History  Diagnosis Date  . Liver disease     cirrhosis and this is why she had the transplant in 2011  . Hepatitis C   . Chronic back pain   . Diverticulosis   . CAD (coronary artery disease)   . Thyroid disease   . Obesity   . Leg pain   . Peripheral vascular disease hands and legs  . Anxiety   . Blood transfusion   . Asthma   . Shortness of breath   . Headache     last migraine 6+yrs ago  . Arthritis     back  . Chronic back pain   . GERD (gastroesophageal reflux disease)     takes Omeprazole daily  . Chronic constipation     takes MIralax and Colace daily  . Oligouria   . Anemia     takes Folic Acid daily  . Hypothyroidism     takes Synthroid daily  . Diabetes mellitus     Levemir 2units daily if > 150  . Depression     takes Cymbalta for "severe" depression  . Pneumonia   . Abdominal  pain, other specified site   . Neuromuscular disorder     carpal tunnel in right hand  . Hypertension     takes Metoprolol and Lisinopril daily, sees Dr Shary Decamp  . Chronic kidney disease     Tue, Thur, Sat, [facility], see Dr. Hyman Hopes    Medications:  Prescriptions prior to admission  Medication Sig  Dispense Refill  . aspirin EC 81 MG tablet Take 81 mg by mouth daily.        . calcium carbonate (TUMS - DOSED IN MG ELEMENTAL CALCIUM) 500 MG chewable tablet Chew 1 tablet by mouth daily.       [name] docusate sodium (COLACE) 100 MG capsule Take 100 mg by mouth 2 (two) times daily.        . DULoxetine (CYMBALTA) 60 MG capsule Take 60 mg by mouth daily.        [name] FOLIC ACID PO Take 0.4 mg by mouth daily.        [name] gabapentin (NEURONTIN) 600 MG tablet Take 600 mg by mouth at bedtime.       . insulin detemir (LEVEMIR) 100 UNIT/ML injection Inject 2 Units into the skin at bedtime as needed. Uses only if blood sugar levels are over 150       . levothyroxine (SYNTHROID, LEVOTHROID) 200 MCG tablet Take 200 mcg by mouth daily.        [name] lisinopril (PRINIVIL,ZESTRIL) 20 MG tablet Take 20 mg by mouth at bedtime.        [name]  metoprolol succinate (TOPROL-XL) 25 MG 24 hr tablet Take 25 mg by mouth 2 (two) times daily. Take once daily on dialysis days, Tuesday, Thursday and Saturday      . omeprazole (PRILOSEC) 20 MG capsule Take 20 mg by mouth daily.        . ondansetron (ZOFRAN-ODT) 4 MG disintegrating tablet Take 4 mg by mouth every 8 (eight) hours as needed. nausea      . polyethylene glycol (MIRALAX / GLYCOLAX) packet Take 17 g by mouth 2 (two) times daily. For constipation      . promethazine (PHENERGAN) 12.5 MG tablet Take 12.5 mg by mouth every 6 (six) hours as needed. For nausea       . senna (SENOKOT) 8.6 MG tablet Take 1 tablet by mouth every evening.       . sucralfate (CARAFATE) 1 G tablet Take 1 g by mouth 2 (two) times daily.        . tacrolimus (PROGRAF) 1 MG capsule Take 5 mg by mouth 2 (two) times daily.       . traMADol (ULTRAM) 50 MG tablet Take 50 mg by mouth every 6 (six) hours as needed. pain      . albuterol (PROVENTIL,VENTOLIN) 90 MCG/ACT inhaler Inhale 2 puffs into the lungs every 4 (four) hours as needed. For shortness of breath       Assessment: 56 yo with a h/o ESRD and liver tx who was  admitted for suspected HCAP. She was recently dc from [facility]. She is post HD today. Her normal schedule is TTS. Vanc/zosyn have been ordered to cover empirically. She has received vanc 1.25g today as a load.   Goal of Therapy:  PreHD vanc = 15-25  Plan:  Vancomycin 500mg  IV TTS F/u with level if needed  Clide Cliff 04/09/2011,6:07 PM

## 2011-04-09 NOTE — ED Notes (Signed)
Called dialysis and confirmed patient is on the list receive dialysis today.

## 2011-04-09 NOTE — H&P (Signed)
PCP:  [name], MD, MD   DOA:  04/09/2011  7:06 AM  Chief Complaint:  Fever and chills with SOB x 1 day  HPI: 56 y/o AA female with hx of HTN, DM, hepc cirrhosis s/p liver trasnplant in 2011 with   ESRD on HD ( Tu, th, sat) secondary to liver transplant related complications, hypothyroidism who missed HD yesterday and came for HD today was sent to ED after being noticed  a temp of 101.3 and shortness of breath. As per pt she missed her HD yesterday as she was with her daughter in ED. Since last evening  she was having shortness of breath with subjective fever and chills. She was progressively short of breath with cough with whitish phlegm and generalized body aches. Patient noted to be febrile and CR showing bilateral opacities suggestive of PNA. Allergies: Allergies  Allergen Reactions  . Acetaminophen Other (See Comments)    Liver problems  . Codeine Itching    Prior to Admission medications   Medication Sig Start Date End Date Taking? Authorizing Provider  aspirin EC 81 MG tablet Take 81 mg by mouth daily.     Yes Historical Provider, MD  calcium carbonate (TUMS - DOSED IN MG ELEMENTAL CALCIUM) 500 MG chewable tablet Chew 1 tablet by mouth daily.    Yes Historical Provider, MD  docusate sodium (COLACE) 100 MG capsule Take 100 mg by mouth 2 (two) times daily.     Yes Historical Provider, MD  DULoxetine (CYMBALTA) 60 MG capsule Take 60 mg by mouth daily.     Yes Historical Provider, MD  FOLIC ACID PO Take 0.4 mg by mouth daily.     Yes Historical Provider, MD  gabapentin (NEURONTIN) 600 MG tablet Take 600 mg by mouth at bedtime.    Yes Historical Provider, MD  insulin detemir (LEVEMIR) 100 UNIT/ML injection Inject 2 Units into the skin at bedtime as needed. Uses only if blood sugar levels are over 150    Yes Historical Provider, MD  levothyroxine (SYNTHROID, LEVOTHROID) 200 MCG tablet Take 200 mcg by mouth daily.     Yes Historical Provider, MD  lisinopril (PRINIVIL,ZESTRIL) 20 MG  tablet Take 20 mg by mouth at bedtime.     Yes Historical Provider, MD  metoprolol succinate (TOPROL-XL) 25 MG 24 hr tablet Take 25 mg by mouth 2 (two) times daily. Take once daily on dialysis days, Tuesday, Thursday and Saturday   Yes Historical Provider, MD  omeprazole (PRILOSEC) 20 MG capsule Take 20 mg by mouth daily.     Yes Historical Provider, MD  ondansetron (ZOFRAN-ODT) 4 MG disintegrating tablet Take 4 mg by mouth every 8 (eight) hours as needed. nausea   Yes Historical Provider, MD  polyethylene glycol (MIRALAX / GLYCOLAX) packet Take 17 g by mouth 2 (two) times daily. For constipation   Yes Historical Provider, MD  promethazine (PHENERGAN) 12.5 MG tablet Take 12.5 mg by mouth every 6 (six) hours as needed. For nausea    Yes Historical Provider, MD  senna (SENOKOT) 8.6 MG tablet Take 1 tablet by mouth every evening.    Yes Historical Provider, MD  sucralfate (CARAFATE) 1 G tablet Take 1 g by mouth 2 (two) times daily.     Yes Historical Provider, MD  tacrolimus (PROGRAF) 1 MG capsule Take 5 mg by mouth 2 (two) times daily.    Yes Historical Provider, MD  traMADol (ULTRAM) 50 MG tablet Take 50 mg by mouth every 6 (six) hours as needed. pain  Yes Historical Provider, MD  albuterol (PROVENTIL,VENTOLIN) 90 MCG/ACT inhaler Inhale 2 puffs into the lungs every 4 (four) hours as needed. For shortness of breath    Historical Provider, MD    Past Medical History  Diagnosis Date  . Liver disease     cirrhosis and this is why she had the transplant in 2011  . Hepatitis C   . Chronic back pain   . Diverticulosis   . CAD (coronary artery disease)   . Thyroid disease   . Obesity   . Leg pain   . Peripheral vascular disease hands and legs  . Anxiety   . Blood transfusion   . Asthma   . Shortness of breath   . Headache     last migraine 6+yrs ago  . Arthritis     back  . Chronic back pain   . GERD (gastroesophageal reflux disease)     takes Omeprazole daily  . Chronic constipation      takes MIralax and Colace daily  . Oligouria   . Anemia     takes Folic Acid daily  . Hypothyroidism     takes Synthroid daily  . Diabetes mellitus     Levemir 2units daily if > 150  . Depression     takes Cymbalta for "severe" depression  . Pneumonia   . Abdominal  pain, other specified site   . Neuromuscular disorder     carpal tunnel in right hand  . Hypertension     takes Metoprolol and Lisinopril daily, sees Dr Shary Decamp  . Chronic kidney disease     Tue, Thur, Sat, [facility], see Dr. Hyman Hopes    Past Surgical History  Procedure Date  . 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 Odette Horns Coord [phone]  . Small intestine surgery 90's  . Thrombectomy   . Arteriovenous graft placement 10/03/10    Left forearm AVG  . Avgg removal 12/23/2010    Procedure: REMOVAL OF ARTERIOVENOUS GORETEX GRAFT (AVGG);  Surgeon: Sherren Kerns, MD;  Location: [facility] OR;  Service: Vascular;  Laterality: Left;  procedure started @1736 -1852  . Insertion of dialysis catheter 12/23/2010    Procedure: INSERTION OF DIALYSIS CATHETER;  Surgeon: Sherren Kerns, MD;  Location: [facility] OR;  Service: Vascular;  Laterality: Right;  Right Internal Jugular 28cm dialysis catheter insertion procedure time 1701-1720   . Cholecystectomy 1993  . Cystoscopy 15+yrs ago  . Back surgery 2010  . Av fistula placement 01/29/2011    Procedure: INSERTION OF ARTERIOVENOUS (AV) GORE-TEX GRAFT ARM;  Surgeon: Sherren Kerns, MD;  Location: [facility] OR;  Service: Vascular;  Laterality: Right;  . Tubal ligation   . Av fistula placement 03/10/2011    Procedure: INSERTION OF ARTERIOVENOUS (AV) GORE-TEX GRAFT THIGH;  Surgeon: Sherren Kerns, MD;  Location: [facility] OR;  Service: Vascular;  Laterality: Left;    Social History:  reports that she has been smoking Cigarettes.  Smokes 1 PPD.  She reports that she does not drink alcohol or use illicit drugs.  Family History  Problem Relation  Age of Onset  . Cancer Mother   . Diabetes Mother   . Hypertension Mother   . Stroke Mother   . Cancer Father   . Anesthesia problems Neg Hx   . Hypotension Neg Hx   . Malignant hyperthermia Neg Hx   . Pseudochol deficiency Neg Hx     Review of Systems:  Constitutional:  fever, chills, diaphoresis, appetite change and fatigue.  HEENT: Denies photophobia, eye pain, redness, hearing loss, ear pain, congestion, sore throat, rhinorrhea, sneezing, mouth sores, trouble swallowing, neck pain, neck stiffness and tinnitus.   Respiratory:  SOB, DOE, cough with whitish phlegm, chest tightness,  and wheezing.   Cardiovascular: has chest discomfort on coughing, denies palpitations and leg swelling.  Gastrointestinal:  nausea, denies vomiting, abdominal pain, diarrhea, constipation, blood in stool and abdominal distention.  Genitourinary: Denies dysuria, urgency, frequency, hematuria, flank pain and difficulty urinating. makes very little urine Musculoskeletal: has myalgias, back pain, joint swelling, arthralgias and gait problem.  Skin: Denies pallor, rash and wound.  Neurological: Denies dizziness, seizures, syncope, weakness, light-headedness, numbness and headaches.  Hematological: Denies adenopathy. Easy bruising, personal or family bleeding history  Psychiatric/Behavioral: Denies suicidal ideation, mood changes, confusion, nervousness, sleep disturbance and agitation   Physical Exam:  Filed Vitals:   04/09/11 1630 04/09/11 1659 04/09/11 1713 04/09/11 1730  BP: 139/73 119/75 150/83 150/68  Pulse: 90 90 87 94  Temp:   98.8 F (37.1 C) 101.3 F (38.5 C)  TempSrc:   Oral Oral  Resp: 28 26 28 24   Height:    5\' 4"  (1.626 m)  Weight:   62.7 kg (138 lb 3.7 oz) 62.7 kg (138 lb 3.7 oz)  SpO2:   100% 100%    Constitutional: Vital signs reviewed.  Patient is a thin built female laying in bed appears fatigued HEENT: no pallor, icteric, moist oral mucosa  Cardiovascular: RRR, S1 normal, S2  normal, no MRG, pulses symmetric and intact bilaterally Pulmonary/Chest: bilateral crackles, scattered ronchi, no wheezes Abdominal: Soft. Non-tender, non-distended, bowel sounds are normal, no masses, organomegaly, or guarding present.  GU: no CVA tenderness Musculoskeletal: No joint deformities, erythema, or stiffness, ROM full and no nontender Ext: no edema and no cyanosis, pulses palpable bilaterally (DP and PT) Hematology: no cervical, inginal, or axillary adenopathy.  Neurological: A&O x3, Strenght is normal and symmetric bilaterally, cranial nerve II-XII are grossly intact, no focal motor deficit, sensory intact to light touch bilaterally.  Skin: Warm, dry and intact. No rash, cyanosis, or clubbing.  Psychiatric: Normal mood and affect. speech and behavior is normal. Judgment and thought content normal. Cognition and memory are normal.   Labs on Admission:  Results for orders placed during the hospital encounter of 04/09/11 (from the past 48 hour(s))  CBC     Status: Abnormal   Collection Time   04/09/11  7:50 AM      Component Value Range Comment   WBC 7.7  4.0 - 10.5 (K/uL)    RBC 4.19  3.87 - 5.11 (MIL/uL)    Hemoglobin 12.3  12.0 - 15.0 (g/dL)    HCT 08.6  57.8 - 46.9 (%)    MCV 91.4  78.0 - 100.0 (fL)    MCH 29.4  26.0 - 34.0 (pg)    MCHC 32.1  30.0 - 36.0 (g/dL)    RDW 62.9 (*) 52.8 - 15.5 (%)    Platelets 112 (*) 150 - 400 (K/uL) PLATELET COUNT CONFIRMED BY SMEAR  DIFFERENTIAL     Status: Normal   Collection Time   04/09/11  7:50 AM      Component Value Range Comment   Neutrophils Relative 68  43 - 77 (%)    Neutro Abs 5.2  1.7 - 7.7 (K/uL)    Lymphocytes Relative 19  12 - 46 (%)    Lymphs Abs 1.5  0.7 - 4.0 (K/uL)  CORRECTED ON 02/27 AT 1610: PREVIOUSLY REPORTED AS 1.4   Monocytes Relative 12  3 - 12 (%)    Monocytes Absolute 0.9  0.1 - 1.0 (K/uL)    Eosinophils Relative 1  0 - 5 (%)    Eosinophils Absolute 0.1  0.0 - 0.7 (K/uL)    Basophils Relative 0  0 - 1 (%)     Basophils Absolute 0.0  0.0 - 0.1 (K/uL)    WBC Morphology ATYPICAL LYMPHOCYTES      RBC Morphology POLYCHROMASIA PRESENT     BASIC METABOLIC PANEL     Status: Abnormal   Collection Time   04/09/11  7:50 AM      Component Value Range Comment   Sodium 138  135 - 145 (mEq/L)    Potassium 5.8 (*) 3.5 - 5.1 (mEq/L) SLIGHT HEMOLYSIS   Chloride 103  96 - 112 (mEq/L)    CO2 17 (*) 19 - 32 (mEq/L)    Glucose, Bld 131 (*) 70 - 99 (mg/dL)    BUN 62 (*) 6 - 23 (mg/dL)    Creatinine, Ser 9.60 (*) 0.50 - 1.10 (mg/dL)    Calcium 9.3  8.4 - 10.5 (mg/dL)    GFR calc non Af Amer 4 (*) >90 (mL/min)    GFR calc Af Amer 5 (*) >90 (mL/min)   LACTIC ACID, PLASMA     Status: Normal   Collection Time   04/09/11  7:50 AM      Component Value Range Comment   Lactic Acid, Venous 1.2  0.5 - 2.2 (mmol/L)   PROCALCITONIN     Status: Normal   Collection Time   04/09/11  7:50 AM      Component Value Range Comment   Procalcitonin 0.23     CARDIAC PANEL(CRET KIN+CKTOT+MB+TROPI)     Status: Normal   Collection Time   04/09/11  7:50 AM      Component Value Range Comment   Total CK 39  7 - 177 (U/L)    CK, MB 2.2  0.3 - 4.0 (ng/mL)    Troponin I <0.30  <0.30 (ng/mL)    Relative Index RELATIVE INDEX IS INVALID  0.0 - 2.5    HEPATIC FUNCTION PANEL     Status: Abnormal   Collection Time   04/09/11  7:50 AM      Component Value Range Comment   Total Protein 6.9  6.0 - 8.3 (g/dL)    Albumin 2.8 (*) 3.5 - 5.2 (g/dL)    AST 86 (*) 0 - 37 (U/L)    ALT 96 (*) 0 - 35 (U/L)    Alkaline Phosphatase 225 (*) 39 - 117 (U/L)    Total Bilirubin 0.6  0.3 - 1.2 (mg/dL)    Bilirubin, Direct 0.3  0.0 - 0.3 (mg/dL) HEMOLYSIS AT THIS LEVEL MAY AFFECT RESULT   Indirect Bilirubin 0.3  0.3 - 0.9 (mg/dL)   GLUCOSE, CAPILLARY     Status: Abnormal   Collection Time   04/09/11  8:13 AM      Component Value Range Comment   Glucose-Capillary 128 (*) 70 - 99 (mg/dL)    Comment 1 Documented in Chart      Comment 2 Notify RN        Radiological Exams on Admission:  CHEST - 2 VIEW  Comparison: 12/23/2010.  Findings: Trachea is midline. Heart size normal. Right IJ  dialysis catheter tips project over the SVC and right atrium,  stable. There is bibasilar air space disease  and small bilateral  pleural effusions.  IMPRESSION:  Bibasilar air space disease and small bilateral pleural effusions.  Assessment/ 56 y/o AA female with hx of HTN, DM, hepc cirrhosis s/p liver trasnplant in 2011 with   ESRD on HD ( Tu, th, sat) secondary to liver transplant related complications, hypothyroidism who missed HD yesterday sent to ED from HD today as she was short of breath , febrile and CXR showing b/l pNA   Plan  *Healthcare-associated pneumonia Recently hospitalized to [facility] for GI symptoms Patient febrile with bibasilar opacity on CXR  will treat empirically with IV vanco and zosyn follow blood cx Albuterol nebs prn   Shortness of breath  associated with HCAP and missing HD Has improve after HD   End stage renal disease On HD M,W,F  had HD today following admission SOB better   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  follows at [facility]  on prograf which will be continued As per renal note she was recently as [place] less than a week back for abd pain , nausea dn vomiting with liver bx showing recurrence of hep c , inconclusive for transplant rejection Mild  transaminitis noted , do not have recent lab to compare  avoid tylenol or benzos   Hypothyroidism  cont synthroid  Diabetes mellitus type 2 cont SSI     Anemia of chronic ds  stable  TOBACCO ABUSE  Counseled on smoking cesssation   Hypertension  cont home meds  Hyperkalemia  monitor in am    DVT prophylaxis   Full code   Time Spent on Admission: 60 minutes  Elley Harp 04/09/2011, 6:13 PM

## 2011-04-09 NOTE — ED Notes (Signed)
Patient remains on monitor and 2L oxygen with sats of 97%. Patient resting with NAD at this time. Family at bedside.

## 2011-04-09 NOTE — ED Notes (Signed)
Patient being transported to dialysis.

## 2011-04-09 NOTE — ED Notes (Signed)
Patient remains on monitor and 2L oxygen w/ sats of 93%. Patient sleeping w/ NAD at this time.

## 2011-04-09 NOTE — Consult Note (Signed)
[facility] Renal Consultation Note  Indication for Consultation:  Management of ESRD/hemodialysis; anemia, hypertension/volume and secondary hyperparathyroidism  HPI: [name] is a 56 y.o. female with ESRD due to complications related to liver transplant presented to HD today, having missed treatment yesterday due to being in the ED with her 19 year old daughter.  She felt weak yesterday and had just been d/c from [facility] 2/21 after a 2/18 admission for N, V and adominal pain. During that admission preliminary bx findings show mild recurrent hepatitis C, indeterminent for rejection. No acute findings on liver U/S.  Today she presented for a makeup HD treatment and was found to be febrile with a temp of 101.2, vollume overloaded, up 7.6 kg.  She had left 1.4 kg up after her 2/23 treatment.  At dialysis today, she was unable to stand without assistance (her daughter drove her) and was SOB, had body aches and a dry cough.  She reported being out in the rain yesterday due to water troubles.  She was brought to [facility] for evaluation and admission.  She said her thigh graft would be ready to use in March.  Dialysis Orders: Center:[facility] T[facility] . EDW 59 HD Bath2 K 2.25 Ca Time 3.5 Heparin none. Access R I-J  BFR 400  DFR A1.5 Zemplar IV/HD Epogen just d/c due to Hgb of 12; was 6600   Units IV/HD  Venofer 50/weekOther   Past Medical History  Diagnosis Date  . Liver disease     cirrhosis and this is why she had the transplant in 2011  . Hepatitis C   . Chronic back pain   . Diverticulosis   . CAD (coronary artery disease)   . Thyroid disease   . Obesity   . Leg pain   . Peripheral vascular disease hands and legs  . Anxiety   . Blood transfusion   . Asthma   . Shortness of breath   . Headache     last migraine 6+yrs ago  . Arthritis     back  . Chronic back pain   . GERD (gastroesophageal reflux disease)     takes Omeprazole daily  . Chronic  constipation     takes MIralax and Colace daily  . Oligouria   . Anemia     takes Folic Acid daily  . Hypothyroidism     takes Synthroid daily  . Diabetes mellitus     Levemir 2units daily if > 150  . Depression     takes Cymbalta for "severe" depression  . Pneumonia   . Abdominal  pain, other specified site   . Neuromuscular disorder     carpal tunnel in right hand  . Hypertension     takes Metoprolol and Lisinopril daily, sees Dr Shary Decamp  . Chronic kidney disease     Tue, Thur, Sat, [facility], see Dr. Hyman Hopes   Past Surgical History  Procedure Date  . 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 Odette Horns Coord [phone]  . Small intestine surgery 90's  . Thrombectomy   . Arteriovenous graft placement 10/03/10    Left forearm AVG  . Avgg removal 12/23/2010    Procedure: REMOVAL OF ARTERIOVENOUS GORETEX GRAFT (AVGG);  Surgeon: Sherren Kerns, MD;  Location: [facility] OR;  Service: Vascular;  Laterality: Left;  procedure started @1736 -0981  . Insertion of dialysis catheter 12/23/2010    Procedure: INSERTION  OF DIALYSIS CATHETER;  Surgeon: Sherren Kerns, MD;  Location: [facility] OR;  Service: Vascular;  Laterality: Right;  Right Internal Jugular 28cm dialysis catheter insertion procedure time 1701-1720   . Cholecystectomy 1993  . Cystoscopy 15+yrs ago  . Back surgery 2010  . Av fistula placement 01/29/2011    Procedure: INSERTION OF ARTERIOVENOUS (AV) GORE-TEX GRAFT ARM;  Surgeon: Sherren Kerns, MD;  Location: [facility] OR;  Service: Vascular;  Laterality: Right;  . Tubal ligation   . Av fistula placement 03/10/2011    Procedure: INSERTION OF ARTERIOVENOUS (AV) GORE-TEX GRAFT THIGH;  Surgeon: Sherren Kerns, MD;  Location: [facility] OR;  Service: Vascular;  Laterality: Left;   Family History  Problem Relation Age of Onset  . Cancer Mother   . Diabetes Mother   . Hypertension Mother   . Stroke Mother   . Cancer Father   . Anesthesia  problems Neg Hx   . Hypotension Neg Hx   . Malignant hyperthermia Neg Hx   . Pseudochol deficiency Neg Hx    Social History: smokes 1/2 ppd; has two daughters  reports that she has been smoking Cigarettes.  She has a 7.5 pack-year smoking history. She has never used smokeless tobacco. She reports that she does not drink alcohol or use illicit drugs. Allergies  Allergen Reactions  . Acetaminophen Other (See Comments)    Liver problems  . Codeine Itching   Prior to Admission medications   Medication Sig Start Date End Date Taking? Authorizing Provider  aspirin EC 81 MG tablet Take 81 mg by mouth daily.     Yes Historical Provider, MD  calcium carbonate (TUMS - DOSED IN MG ELEMENTAL CALCIUM) 500 MG chewable tablet Chew 1 tablet by mouth daily.    Yes Historical Provider, MD  docusate sodium (COLACE) 100 MG capsule Take 100 mg by mouth 2 (two) times daily.     Yes Historical Provider, MD  DULoxetine (CYMBALTA) 60 MG capsule Take 60 mg by mouth daily.     Yes Historical Provider, MD  FOLIC ACID PO Take 0.4 mg by mouth daily.     Yes Historical Provider, MD  gabapentin (NEURONTIN) 600 MG tablet Take 600 mg by mouth at bedtime.    Yes Historical Provider, MD  insulin detemir (LEVEMIR) 100 UNIT/ML injection Inject 2 Units into the skin at bedtime as needed. Uses only if blood sugar levels are over 150    Yes Historical Provider, MD  levothyroxine (SYNTHROID, LEVOTHROID) 200 MCG tablet Take 200 mcg by mouth daily.     Yes Historical Provider, MD  lisinopril (PRINIVIL,ZESTRIL) 20 MG tablet Take 20 mg by mouth at bedtime.     Yes Historical Provider, MD  metoprolol succinate (TOPROL-XL) 25 MG 24 hr tablet Take 25 mg by mouth 2 (two) times daily. Take once daily on dialysis days, Tuesday, Thursday and Saturday   Yes Historical Provider, MD  omeprazole (PRILOSEC) 20 MG capsule Take 20 mg by mouth daily.     Yes Historical Provider, MD  ondansetron (ZOFRAN-ODT) 4 MG disintegrating tablet Take 4 mg by  mouth every 8 (eight) hours as needed. nausea   Yes Historical Provider, MD  polyethylene glycol (MIRALAX / GLYCOLAX) packet Take 17 g by mouth 2 (two) times daily. For constipation   Yes Historical Provider, MD  promethazine (PHENERGAN) 12.5 MG tablet Take 12.5 mg by mouth every 6 (six) hours as needed. For nausea    Yes Historical Provider, MD  senna (SENOKOT) 8.6 MG tablet Take  1 tablet by mouth every evening.    Yes Historical Provider, MD  sucralfate (CARAFATE) 1 G tablet Take 1 g by mouth 2 (two) times daily.     Yes Historical Provider, MD  tacrolimus (PROGRAF) 1 MG capsule Take 5 mg by mouth 2 (two) times daily.    Yes Historical Provider, MD  traMADol (ULTRAM) 50 MG tablet Take 50 mg by mouth every 6 (six) hours as needed. pain   Yes Historical Provider, MD  albuterol (PROVENTIL,VENTOLIN) 90 MCG/ACT inhaler Inhale 2 puffs into the lungs every 4 (four) hours as needed. For shortness of breath    Historical Provider, MD   Current Facility-Administered Medications  Medication Dose Route Frequency Provider Last Rate Last Dose  . 0.9 %  sodium chloride infusion  100 mL Intravenous PRN Sheffield Slider, PA      . 0.9 %  sodium chloride infusion  100 mL Intravenous PRN Sheffield Slider, PA      . acetaminophen (TYLENOL) 325 MG tablet        650 mg at 04/09/11 1327  . alteplase (CATHFLO ACTIVASE) injection 2 mg  2 mg Intracatheter Once PRN Sheffield Slider, PA      . calcium carbonate (dosed in mg elemental calcium) suspension 500 mg of elemental calcium  500 mg of elemental calcium Oral Q6H PRN Sheffield Slider, PA      . camphor-menthol [facility]) lotion 1 application  1 application Topical Q8H PRN Sheffield Slider, PA       And  . hydrOXYzine (ATARAX/VISTARIL) tablet 25 mg  25 mg Oral Q8H PRN Sheffield Slider, PA      . ciprofloxacin (CIPRO) IVPB 400 mg  400 mg Intravenous Once Nat Christen, MD   400 mg at 04/09/11 1150  . docusate sodium (ENEMEEZ) enema 283 mg  1 enema Rectal PRN  Sheffield Slider, PA      . feeding supplement (NEPRO CARB STEADY) liquid 237 mL  237 mL Oral TID PRN Sheffield Slider, PA      . heparin injection 1,000 Units  1,000 Units Dialysis PRN Sheffield Slider, PA      . heparin injection 1,200 Units  20 Units/kg Dialysis PRN Sheffield Slider, PA   1,200 Units at 04/09/11 1316  . lidocaine (XYLOCAINE) 1 % injection 5 mL  5 mL Intradermal PRN Sheffield Slider, PA      . lidocaine-prilocaine (EMLA) cream 1 application  1 application Topical PRN Sheffield Slider, PA      . ondansetron [facility]) injection 4 mg  4 mg Intravenous Once Nat Christen, MD   4 mg at 04/09/11 0953  . ondansetron (ZOFRAN) tablet 4 mg  4 mg Oral Q6H PRN Sheffield Slider, PA       Or  . ondansetron [facility]) injection 4 mg  4 mg Intravenous Q6H PRN Sheffield Slider, PA      . pentafluoroprop-tetrafluoroeth (GEBAUERS) aerosol 1 application  1 application Topical PRN Sheffield Slider, PA      . piperacillin-tazobactam (ZOSYN) IVPB 2.25 g  2.25 g Intravenous Q8H Nat Christen, MD      . sorbitol 70 % solution 30 mL  30 mL Oral PRN Sheffield Slider, PA      . vancomycin (VANCOCIN) 1,250 mg in sodium chloride 0.9 % 250 mL IVPB  1,250 mg Intravenous Once Nishant Dhungel, MD      . zolpidem (AMBIEN) tablet 5 mg  5 mg Oral QHS PRN Sheffield Slider, PA      . DISCONTD: vancomycin (VANCOCIN) injection 1 g  1 g Intravenous Once Nat Christen, MD       Current Outpatient Prescriptions  Medication Sig Dispense Refill  . aspirin EC 81 MG tablet Take 81 mg by mouth daily.        . calcium carbonate (TUMS - DOSED IN MG ELEMENTAL CALCIUM) 500 MG chewable tablet Chew 1 tablet by mouth daily.       [name] docusate sodium (COLACE) 100 MG capsule Take 100 mg by mouth 2 (two) times daily.        . DULoxetine (CYMBALTA) 60 MG capsule Take 60 mg by mouth daily.        [name] FOLIC ACID PO Take 0.4 mg by mouth daily.        [name] gabapentin (NEURONTIN) 600 MG tablet Take 600 mg by mouth at  bedtime.       . insulin detemir (LEVEMIR) 100 UNIT/ML injection Inject 2 Units into the skin at bedtime as needed. Uses only if blood sugar levels are over 150       . levothyroxine (SYNTHROID, LEVOTHROID) 200 MCG tablet Take 200 mcg by mouth daily.        [name] lisinopril (PRINIVIL,ZESTRIL) 20 MG tablet Take 20 mg by mouth at bedtime.        . metoprolol succinate (TOPROL-XL) 25 MG 24 hr tablet Take 25 mg by mouth 2 (two) times daily. Take once daily on dialysis days, Tuesday, Thursday and Saturday      . omeprazole (PRILOSEC) 20 MG capsule Take 20 mg by mouth daily.        . ondansetron (ZOFRAN-ODT) 4 MG disintegrating tablet Take 4 mg by mouth every 8 (eight) hours as needed. nausea      . polyethylene glycol (MIRALAX / GLYCOLAX) packet Take 17 g by mouth 2 (two) times daily. For constipation      . promethazine (PHENERGAN) 12.5 MG tablet Take 12.5 mg by mouth every 6 (six) hours as needed. For nausea       . senna (SENOKOT) 8.6 MG tablet Take 1 tablet by mouth every evening.       . sucralfate (CARAFATE) 1 G tablet Take 1 g by mouth 2 (two) times daily.        . tacrolimus (PROGRAF) 1 MG capsule Take 5 mg by mouth 2 (two) times daily.       . traMADol (ULTRAM) 50 MG tablet Take 50 mg by mouth every 6 (six) hours as needed. pain      . albuterol (PROVENTIL,VENTOLIN) 90 MCG/ACT inhaler Inhale 2 puffs into the lungs every 4 (four) hours as needed. For shortness of breath       Labs: Basic Metabolic Panel:  Lab 04/09/11 1610  NA 138  K 5.8*  CL 103  CO2 17*  GLUCOSE 131*  BUN 62*  CREATININE 9.50*  CALCIUM 9.3  ALB --  PHOS --   Liver Function Tests:  Lab 04/09/11 0750  AST 86*  ALT 96*  ALKPHOS 225*  BILITOT 0.6  PROT 6.9  ALBUMIN 2.8*  CBC:  Lab 04/09/11 0750  WBC 7.7  NEUTROABS 5.2  HGB 12.3  HCT 38.3  MCV 91.4  PLT 112*   Cardiac Enzymes:  Lab 04/09/11 0750  CKTOTAL 39  CKMB 2.2  CKMBINDEX --  TROPONINI <0.30   CBG:  Lab 04/09/11 0813  GLUCAP 128*   Dg  Chest 2 View  04/09/2011  *RADIOLOGY REPORT*  Clinical Data: Shortness of breath and chills.  CHEST - 2 VIEW  Comparison: 12/23/2010.  Findings: Trachea is midline.  Heart size normal.  Right IJ dialysis catheter tips project over the SVC and right atrium, stable.  There is bibasilar air space disease and small bilateral pleural effusions.  IMPRESSION: Bibasilar air space disease and small bilateral pleural effusions.  Original Report Authenticated By: Reyes Ivan, M.D.    ROS: As per HPI plus, no chest pain, + nausea but no diarrhea, makes small amount of urine without dysuria or hematuria, chronic constipation on multiple laxatives   Physical Exam: Filed Vitals:   04/09/11 1302 04/09/11 1310 04/09/11 1330 04/09/11 1400  BP: 177/91 194/101 201/99 192/101  Pulse: 86 87 85 81  Temp:      TempSrc:      Resp: 22 16 17 28   Height:      Weight:      SpO2:         General: Well developed,woman curled up on dialysis c/o being cold and SOB acutely ill Head: Normocephalic, atraumatic,  mucus membranes are moist Neck: Supple left I-J temporary catheter Lungs: diffuse rhonchi/crackles, tachypnea Heart: RRR with S1 S2. No murmurs, rubs, or gallops appreciated. Abdomen: Soft, non-tender, non-distended with normoactive bowel sounds Lower extremities:without edema or ischemic changes, no open wounds  Neuro: Alert and oriented X 3. Moves all extremities spontaneously. Psych:  Responds to questions appropriately with a normal affect. Dialysis Access: right I-J and patent left thigh graft  Assessment/Plan: 1. Bilateral pneumonia in pt with chronic tobacco use and probable COPD- acute onset with recent hospitalization; on cipro, vanc and zosyn  2. ESRD - TTS of schedule - check cxr post HD; consider extra treatment  Thurs to get back on schedule; I think we can use graft next time; she is on no heparin HD at outpt center for a long time, not just recently. They could not tell my why she is no  heparin; receives heparin blocks in perm cath. .She received tight heparin today. Return to no heparin HD next time. 3  Hypertension/volume  - needs decreased volume and continue usual meds; NOTE metorpolol is on NON dialysis days 4. Anemia  - Hgb > 12; no ESA; weekly IV Fe 5. Metabolic bone disease -   P elevated; usually much better; resume zemplar when P comes down;pt states she takes phoslyra "to the top line"  Changed to 30cc/meal in November by Dr. Hyman Hopes - not on med list; this is = to 6 phoslo which is quite a lot., but this could explain why P is so high; start with 20 cc phoslyra/meal = 4 phoslo 6. Nutrition - renal diet 7. Liver transplant with ^ LFTs, recent bx suggestive of recurrent mild hep C - followed at [facility] - need to continue transplant meds;  8. Constipation - need to resume usual meds; she was started on Lubiprostone (amitiza)  8 mg bid at [facility] 9. Thrombocytopenia - plts 112K  (140s at [facility]) - follow 10. Diabetes mellitus- on very low dose of insulin  Sheffield Slider, PA-C [facility] Beeper [phone] 04/09/2011, 2:19 PM   Patient seen and examined and agree with assessment and plan as above.  Acutely ill 56 yo BF with ESRD and liver transplant (due to hep C) in 2011.  Admitted with high fevers, chills and SOB, with bilat basilar pulm infiltrates on CXR.  Recently  admitted at [facility] for abd pain, see above for details. SOB better after HD suggests pulm edema component. Repeat CXR in am.  Tomorrow is usual HD day, will prob due a short session about 3 hrs.     Vinson Moselle  MD [employer] [phone] pgr    [phone] cell 04/09/2011, 5:16 PM

## 2011-04-09 NOTE — ED Notes (Signed)
Patient resting with NAD at this time. Patient remains on monitor and 2L oxygen with sats of 98%.  Family at bedside.

## 2011-04-10 ENCOUNTER — Inpatient Hospital Stay (HOSPITAL_COMMUNITY): Payer: Self-pay

## 2011-04-10 ENCOUNTER — Ambulatory Visit: Payer: Managed Care, Other (non HMO) | Admitting: Vascular Surgery

## 2011-04-10 DIAGNOSIS — N186 End stage renal disease: Secondary | ICD-10-CM | POA: Diagnosis not present

## 2011-04-10 LAB — COMPREHENSIVE METABOLIC PANEL
ALT: 71 U/L — ABNORMAL HIGH (ref 0–35)
Albumin: 2.4 g/dL — ABNORMAL LOW (ref 3.5–5.2)
Alkaline Phosphatase: 187 U/L — ABNORMAL HIGH (ref 39–117)
GFR calc Af Amer: 8 mL/min — ABNORMAL LOW (ref >90)
Glucose, Bld: 157 mg/dL — ABNORMAL HIGH (ref 70–99)
Potassium: 4.1 mEq/L (ref 3.5–5.1)
Sodium: 136 mEq/L (ref 135–145)
Total Protein: 6.1 g/dL (ref 6.0–8.3)

## 2011-04-10 LAB — HEPATITIS B SURFACE ANTIGEN: Hepatitis B Surface Ag: NEGATIVE

## 2011-04-10 LAB — GLUCOSE, CAPILLARY: Glucose-Capillary: 204 mg/dL — ABNORMAL HIGH (ref 70–99)

## 2011-04-10 MED ORDER — INSULIN ASPART 100 UNIT/ML ~~LOC~~ SOLN
0.0000 [IU] | Freq: Three times a day (TID) | SUBCUTANEOUS | Status: DC
  Administered 2011-04-10: 3 [IU] via SUBCUTANEOUS
  Administered 2011-04-10: 2 [IU] via SUBCUTANEOUS
  Administered 2011-04-11: 3 [IU] via SUBCUTANEOUS
  Filled 2011-04-10: qty 3

## 2011-04-10 MED ORDER — METOPROLOL SUCCINATE ER 25 MG PO TB24
25.0000 mg | ORAL_TABLET | ORAL | Status: DC
  Administered 2011-04-11 (×2): 25 mg via ORAL
  Filled 2011-04-10 (×2): qty 1

## 2011-04-10 MED ORDER — ALBUTEROL SULFATE (5 MG/ML) 0.5% IN NEBU: 2.5000 mg | INHALATION_SOLUTION | Freq: Four times a day (QID) | RESPIRATORY_TRACT | Status: DC | PRN

## 2011-04-10 NOTE — Progress Notes (Signed)
Subjective: Patient seen and examined this am. informs feeling much better. SOB much improved and afebrile today  Objective:  Vital signs in last 24 hours:  Filed Vitals:   04/10/11 1522 04/10/11 1530 04/10/11 1600 04/10/11 1630  BP: 164/85 143/70 112/68 122/78  Pulse: 68 75 81 76  Temp:      TempSrc:      Resp:      Height:      Weight:      SpO2:        Intake/Output from previous day:   Intake/Output Summary (Last 24 hours) at 04/10/11 1707 Last data filed at 04/10/11 1407  Gross per 24 hour  Intake   1180 ml  Output   4944 ml  Net  -3764 ml    Physical Exam:  General:elderly thin built in no acute distress. HEENT: no pallor, icteric, moist oral mucosa, no JVD, no lymphadenopathy Heart: Normal  s1 &s2  Regular rate and rhythm, without murmurs, rubs, gallops. Lungs: diminished breath sounds at bases Abdomen: Soft, nontender, nondistended, positive bowel sounds. Extremities: No clubbing cyanosis or edema with positive pedal pulses. Neuro: Alert, awake, oriented x3, nonfocal.   Lab Results:  Basic Metabolic Panel:    Component Value Date/Time   NA 136 04/10/2011 0542   K 4.1 04/10/2011 0542   CL 98 04/10/2011 0542   CO2 23 04/10/2011 0542   BUN 35* 04/10/2011 0542   CREATININE 5.96* 04/10/2011 0542   GLUCOSE 157* 04/10/2011 0542   CALCIUM 8.6 04/10/2011 0542   CBC:    Component Value Date/Time   WBC 7.7 04/09/2011 0750   HGB 12.3 04/09/2011 0750   HCT 38.3 04/09/2011 0750   PLT 112* 04/09/2011 0750   MCV 91.4 04/09/2011 0750   NEUTROABS 5.2 04/09/2011 0750   LYMPHSABS 1.5 04/09/2011 0750   MONOABS 0.9 04/09/2011 0750   EOSABS 0.1 04/09/2011 0750   BASOSABS 0.0 04/09/2011 0750    Recent Results (from the past 240 hour(s))  CULTURE, BLOOD (ROUTINE X 2)     Status: Normal (Preliminary result)   Collection Time   04/09/11  7:50 AM      Component Value Range Status Comment   Specimen Description BLOOD NECK   Final    Special Requests     Final    Value: BOTTLES  DRAWN AEROBIC AND ANAEROBIC BLUE 10CC RED 3CC EJ IN NECK   Culture  Setup Time [id number]   Final    Culture     Final    Value:        BLOOD CULTURE RECEIVED NO GROWTH TO DATE CULTURE WILL BE HELD FOR 5 DAYS BEFORE ISSUING A FINAL NEGATIVE REPORT   Report Status PENDING   Incomplete   CULTURE, BLOOD (ROUTINE X 2)     Status: Normal (Preliminary result)   Collection Time   04/09/11  8:23 AM      Component Value Range Status Comment   Specimen Description BLOOD HAND RIGHT   Final    Special Requests BOTTLES DRAWN AEROBIC ONLY [facility]   Final    Culture  Setup Time [id number]   Final    Culture     Final    Value:        BLOOD CULTURE RECEIVED NO GROWTH TO DATE CULTURE WILL BE HELD FOR 5 DAYS BEFORE ISSUING A FINAL NEGATIVE REPORT   Report Status PENDING   Incomplete     Studies/Results: Dg Chest 2 View  04/10/2011  *RADIOLOGY REPORT*  Clinical Data: Pleural effusions.  CHEST - 2 VIEW  Comparison: 04/09/2011  Findings: Bilateral pleural effusions have decreased and the heart size and vascularity are normal.  No residual pulmonary edema. Double-lumen central catheter in place.  IMPRESSION:  1.  Resolution of mild pulmonary edema. 2.  Decreasing small bilateral pleural effusions.  Original Report Authenticated By: Gwynn Burly, M.D.   Dg Chest 2 View  04/09/2011  *RADIOLOGY REPORT*  Clinical Data: Shortness of breath and chills.  CHEST - 2 VIEW  Comparison: 12/23/2010.  Findings: Trachea is midline.  Heart size normal.  Right IJ dialysis catheter tips project over the SVC and right atrium, stable.  There is bibasilar air space disease and small bilateral pleural effusions.  IMPRESSION: Bibasilar air space disease and small bilateral pleural effusions.  Original Report Authenticated By: Reyes Ivan, M.D.    Medications: Scheduled Meds:   . aspirin EC  81 mg Oral Daily  . calcium acetate (Phos Binder)  2,668 mg Oral TID WC  . calcium carbonate  1 tablet Oral Daily  . docusate sodium   100 mg Oral BID  . DULoxetine  60 mg Oral Daily  . ferric gluconate (FERRLECIT/NULECIT) IV  62.5 mg Intravenous Q Fri  . folic acid  1 mg Oral Daily  . heparin  5,000 Units Subcutaneous Q8H  . ibuprofen  400 mg Oral Once  . insulin aspart  0-9 Units Subcutaneous TID WC  . levothyroxine  200 mcg Oral Q0600  . lisinopril  20 mg Oral QHS  . metoprolol succinate  25 mg Oral Custom  . multivitamin  1 tablet Oral QHS  . pantoprazole  40 mg Oral Q1200  . piperacillin-tazobactam (ZOSYN)  IV  2.25 g Intravenous Q8H  . polyethylene glycol  17 g Oral BID  . senna  1 tablet Oral Q2000  . sodium chloride  3 mL Intravenous Q12H  . sucralfate  1 g Oral BID  . tacrolimus  5 mg Oral BID  . vancomycin  1,250 mg Intravenous Once  . vancomycin  500 mg Intravenous Q T,Th,Sa-HD  . DISCONTD: albuterol  2.5 mg Nebulization Q6H  . DISCONTD: gabapentin  600 mg Oral QHS  . DISCONTD: metoprolol succinate  25 mg Oral BID  . DISCONTD: piperacillin-tazobactam (ZOSYN)  IV  2.25 g Intravenous Q8H  . DISCONTD: piperacillin-tazobactam  2.25 g Intravenous Q8H  . DISCONTD: senna  1 tablet Oral QPM   Continuous Infusions:  PRN Meds:.sodium chloride, sodium chloride, sodium chloride, albuterol, albuterol, albuterol, alteplase, calcium carbonate (dosed in mg elemental calcium), camphor-menthol, docusate sodium, feeding supplement (NEPRO CARB STEADY), heparin, heparin, hydrOXYzine, lidocaine, lidocaine-prilocaine, ondansetron (ZOFRAN) IV, ondansetron, ondansetron, pentafluoroprop-tetrafluoroeth, promethazine, sodium chloride, sorbitol, traMADol, DISCONTD: acetaminophen DISCONTD: acetaminophen, DISCONTD: albuterol, DISCONTD: zolpidem  Assessment/  56 y/o AA female with hx of HTN, DM, hepc cirrhosis s/p liver trasnplant in 2011 with ESRD on HD ( Tu, th, sat) secondary to liver transplant related complications, hypothyroidism who missed HD yesterday sent to ED from HD today as she was short of breath , febrile and CXR showing  b/l PNA   Plan  *Healthcare-associated pneumonia  Recently hospitalized to [facility] for GI symptoms  Patient febrile with bibasilar opacity on CXR  treating empirically with IV vanco and zosyn  follow blood cx  Albuterol nebs prn  symptoms much improved today after HD  Shortness of breath  associated with HCAP and missing HD  Has improved markedly  after HD   End stage renal disease  On  HD M,W,F  Had further HD today  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  follows at [facility]  on prograf which will be continued  As per renal note she was recently as [facility] less than a week back for abd pain , nausea dn vomiting with liver bx showing recurrence of hep c , inconclusive for transplant rejection  Mild transaminitis noted on admission , do not have recent lab to compare  avoid tylenol or benzos   Hypothyroidism  cont synthroid   Diabetes mellitus type 2  cont SSI   Anemia of chronic ds  stable   TOBACCO ABUSE  Counseled on smoking cessation   Hypertension  cont home meds   Hyperkalemia  Stable post HD  DVT prophylaxis  Full code   LOS: 1 day   [name] 04/10/2011, 5:07 PM

## 2011-04-10 NOTE — Progress Notes (Signed)
Utilization review completed.  

## 2011-04-10 NOTE — Progress Notes (Signed)
[facility] Progress Note Subjective:  Feels much better, slept all nite. Some cough.  Breathing ok, but feels like she has fluid on around her belly.  Objective Filed Vitals:   04/09/11 2139 04/10/11 0000 04/10/11 0516 04/10/11 0947  BP: 142/57  121/52 144/71  Pulse: 94  70 67  Temp: 101.9 F (38.8 C) 100.1 F (37.8 C) 98 F (36.7 C) 98.3 F (36.8 C)  TempSrc: Oral  Oral Oral  Resp: 22  20 17   Height:      Weight:      SpO2: 100%  100% 98%   Physical Exam: General: Icteric, ill appearing, but breathing easier; up in room brushing teeth Heart: RRR Lungs: CTAB today Abdomen: distended + ascites Extremities: no LE edema Dialysis Access: right fem cath and left thigh graft = thrill and bruit; well healed; thickened scar tissue in groin no drainage.  Problem/Plan: 1. ? Bilateral pneumonia in pt with chronic tobacco use and probable COPD- acute onset with recent hospitalization; on cipro, vanc and zosyn; repeat CXR cited resolution of pulmonary edema and sm bilateral pleural effusions. BC x 2 pending; at high risk for infection due to immunocompromised state, recent hospitalization in [place] and dialysis catheter. 2. ESRD - TTS of schedule - 3 hr treatment today to get back on schedule 3 Hypertension/volume - needs decreased volume and continue usual meds; NOTE metorpolol is on NON dialysis days; post HD wt 62.7 with net UF of 4.9 liters yesterday; EDW is 59; received am dose of metoprolol so it might affect BP on HD today. 4. Anemia - Hgb > 12; no ESA; weekly IV Fe  5. Metabolic bone disease - P elevated ( at [facility]); usually much better; resume zemplar when P comes down;pt states she takes phoslyra "to the top line" Changed to 30cc/meal in November by Dr. Hyman Hopes - not on med list; this is = to 6 phoslo which is quite a lot., but this could explain why P is so high; start with 20 cc phoslyra/meal = 4 phoslo; check P pre HD 6. Nutrition - renal diet  7. 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 Constipation - need to resume usual meds; she was started on Lubiprostone (amitiza) 8 mg bid at last [facility] d/c  9. Thrombocytopenia - plts 112K (140s at [facility]) - follow  10. Diabetes mellitus- on very low dose of insulin -CBG ok  Additional Objective Labs: Basic Metabolic Panel:  Lab 04/10/11 4540 04/09/11 0750  NA 136 138  K 4.1 5.8*  CL 98 103  CO2 23 17*  GLUCOSE 157* 131*  BUN 35* 62*  CREATININE 5.96* 9.50*  CALCIUM 8.6 9.3  ALB -- --  PHOS -- --   Liver Function Tests:  Lab 04/10/11 0542 04/09/11 0750  AST 68* 86*  ALT 71* 96*  ALKPHOS 187* 225*  BILITOT 1.1 0.6  PROT 6.1 6.9  ALBUMIN 2.4* 2.8*  CBC:  Lab 04/09/11 0750  WBC 7.7  NEUTROABS 5.2  HGB 12.3  HCT 38.3  MCV 91.4  PLT 112*   Blood Culture    Component Value Date/Time   SDES BLOOD HAND RIGHT 04/09/2011 0823   SPECREQUEST BOTTLES DRAWN AEROBIC ONLY 2CC 04/09/2011 0823   CULT        BLOOD CULTURE RECEIVED NO GROWTH TO DATE CULTURE WILL BE HELD FOR 5 DAYS BEFORE  ISSUING A FINAL NEGATIVE REPORT 04/09/2011 0823   REPTSTATUS PENDING 04/09/2011 0823    Cardiac Enzymes:  Lab 04/09/11 0750  CKTOTAL 39  CKMB 2.2  CKMBINDEX --  TROPONINI <0.30   CBG:  Lab 04/10/11 0734 04/09/11 2138 04/09/11 1831 04/09/11 0813  GLUCAP 118* 157* 101* 128*  Studies/Results: Dg Chest 2 View  04/10/2011  *RADIOLOGY REPORT*  Clinical Data: Pleural effusions.  CHEST - 2 VIEW  Comparison: 04/09/2011  Findings: Bilateral pleural effusions have decreased and the heart size and vascularity are normal.  No residual pulmonary edema. Double-lumen central catheter in place.  IMPRESSION:  1.  Resolution of mild pulmonary edema. 2.  Decreasing small bilateral pleural effusions.  Original Report Authenticated By: Gwynn Burly, M.D.   Dg Chest 2 View  04/09/2011  *RADIOLOGY REPORT*  Clinical  Data: Shortness of breath and chills.  CHEST - 2 VIEW  Comparison: 12/23/2010.  Findings: Trachea is midline.  Heart size normal.  Right IJ dialysis catheter tips project over the SVC and right atrium, stable.  There is bibasilar air space disease and small bilateral pleural effusions.  IMPRESSION: Bibasilar air space disease and small bilateral pleural effusions.  Original Report Authenticated By: Reyes Ivan, M.D.   Medications:      . acetaminophen      . aspirin EC  81 mg Oral Daily  . calcium acetate (Phos Binder)  2,668 mg Oral TID WC  . calcium carbonate  1 tablet Oral Daily  . ciprofloxacin  400 mg Intravenous Once  . docusate sodium  100 mg Oral BID  . DULoxetine  60 mg Oral Daily  . ferric gluconate (FERRLECIT/NULECIT) IV  62.5 mg Intravenous Q Fri  . folic acid  1 mg Oral Daily  . heparin  5,000 Units Subcutaneous Q8H  . ibuprofen  400 mg Oral Once  . insulin aspart  0-9 Units Subcutaneous TID WC  . levothyroxine  200 mcg Oral Q0600  . lisinopril  20 mg Oral QHS  . metoprolol succinate  25 mg Oral BID  . multivitamin  1 tablet Oral QHS  . pantoprazole  40 mg Oral Q1200  . piperacillin-tazobactam (ZOSYN)  IV  2.25 g Intravenous Q8H  . polyethylene glycol  17 g Oral BID  . senna  1 tablet Oral Q2000  . sodium chloride  3 mL Intravenous Q12H  . sucralfate  1 g Oral BID  . tacrolimus  5 mg Oral BID  . vancomycin  1,250 mg Intravenous Once  . vancomycin  500 mg Intravenous Q T,Th,Sa-HD  . DISCONTD: albuterol  2.5 mg Nebulization Q6H  . DISCONTD: gabapentin  600 mg Oral QHS  . DISCONTD: piperacillin-tazobactam (ZOSYN)  IV  2.25 g Intravenous Q8H  . DISCONTD: piperacillin-tazobactam  2.25 g Intravenous Q8H  . DISCONTD: senna  1 tablet Oral QPM  . DISCONTD: vancomycin  1 g Intravenous Once    I  have reviewed scheduled and prn medications.  Sheffield Slider, PA-C [facility] Beeper [phone]  04/10/2011,10:51 AM  LOS: 1 day   Patient seen and  examined and agree with assessment and plan as above.  Temps improving, SOB and CXR better.  Continuing abx for suspected PNA, also volume excess with pulm edema improved. Rec's as above. Vinson Moselle  MD [employer] [phone] pgr    [phone] cell 04/10/2011, 1:25 PM       .

## 2011-04-10 NOTE — Progress Notes (Deleted)
Utilization review completed.  

## 2011-04-10 NOTE — Procedures (Signed)
I was present at this dialysis session. I have reviewed the session itself and made appropriate changes. BFR 400 and BP 122/78, pt stable.   Vinson Moselle, MD [employer] 04/10/2011, 4:49 PM

## 2011-04-11 DIAGNOSIS — N186 End stage renal disease: Secondary | ICD-10-CM | POA: Diagnosis not present

## 2011-04-11 DIAGNOSIS — R509 Fever, unspecified: Secondary | ICD-10-CM | POA: Diagnosis not present

## 2011-04-11 DIAGNOSIS — J811 Chronic pulmonary edema: Secondary | ICD-10-CM | POA: Diagnosis not present

## 2011-04-11 DIAGNOSIS — R0602 Shortness of breath: Secondary | ICD-10-CM | POA: Diagnosis not present

## 2011-04-11 LAB — BASIC METABOLIC PANEL
BUN: 25 mg/dL — ABNORMAL HIGH (ref 6–23)
Chloride: 99 mEq/L (ref 96–112)
GFR calc Af Amer: 11 mL/min — ABNORMAL LOW (ref >90)
Glucose, Bld: 153 mg/dL — ABNORMAL HIGH (ref 70–99)
Potassium: 3.9 mEq/L (ref 3.5–5.1)

## 2011-04-11 LAB — GLUCOSE, CAPILLARY
Glucose-Capillary: 114 mg/dL — ABNORMAL HIGH (ref 70–99)
Glucose-Capillary: 115 mg/dL — ABNORMAL HIGH (ref 70–99)

## 2011-04-11 MED ORDER — HEPARIN SODIUM (PORCINE) 1000 UNIT/ML DIALYSIS
20.0000 [IU]/kg | INTRAMUSCULAR | Status: DC | PRN
  Filled 2011-04-11: qty 2

## 2011-04-11 NOTE — Progress Notes (Signed)
ANTIBIOTIC CONSULT NOTE - FOLLOW UP  Pharmacy Consult for Vancomycin Indication: Empiric HCAP coverage  Allergies  Allergen Reactions  . Acetaminophen Other (See Comments)    Liver problems  . Codeine Itching    Patient Measurements: Height: 5\' 4"  (162.6 cm) Weight: 130 lb 8.2 oz (59.2 kg) IBW/kg (Calculated) : 54.7    Vital Signs: Temp: 98 F (36.7 C) (03/01 0840) Temp src: Oral (03/01 0840) BP: 134/73 mmHg (03/01 0840) Pulse Rate: 74  (03/01 0840) Intake/Output from previous day: 02/28 0701 - 03/01 0700 In: 780 [P.O.:630; IV Piggyback:150] Out: 4678  Intake/Output from this shift: Total I/O In: -  Out: 1 [Emesis/NG output:1]  Labs:  Basename 04/11/11 0530 04/10/11 0542 04/09/11 0750  WBC -- -- 7.7  HGB -- -- 12.3  PLT -- -- 112*  LABCREA -- -- --  CREATININE 4.63* 5.96* 9.50*   Estimated Creatinine Clearance: 11.7 ml/min (by C-G formula based on Cr of 4.63). No results found for this basename: VANCOTROUGH:2,VANCOPEAK:2,VANCORANDOM:2,GENTTROUGH:2,GENTPEAK:2,GENTRANDOM:2,TOBRATROUGH:2,TOBRAPEAK:2,TOBRARND:2,AMIKACINPEAK:2,AMIKACINTROU:2,AMIKACIN:2, in the last 72 hours   Microbiology: Recent Results (from the past 720 hour(s))  CULTURE, BLOOD (ROUTINE X 2)     Status: Normal (Preliminary result)   Collection Time   04/09/11  7:50 AM      Component Value Range Status Comment   Specimen Description BLOOD NECK   Final    Special Requests     Final    Value: BOTTLES DRAWN AEROBIC AND ANAEROBIC BLUE 10CC RED 3CC EJ IN NECK   Culture  Setup Time [id number]   Final    Culture     Final    Value:        BLOOD CULTURE RECEIVED NO GROWTH TO DATE CULTURE WILL BE HELD FOR 5 DAYS BEFORE ISSUING A FINAL NEGATIVE REPORT   Report Status PENDING   Incomplete   CULTURE, BLOOD (ROUTINE X 2)     Status: Normal (Preliminary result)   Collection Time   04/09/11  8:23 AM      Component Value Range Status Comment   Specimen Description BLOOD HAND RIGHT   Final    Special  Requests BOTTLES DRAWN AEROBIC ONLY 2CC   Final    Culture  Setup Time [id number]   Final    Culture     Final    Value:        BLOOD CULTURE RECEIVED NO GROWTH TO DATE CULTURE WILL BE HELD FOR 5 DAYS BEFORE ISSUING A FINAL NEGATIVE REPORT   Report Status PENDING   Incomplete     Anti-infectives     Start     Dose/Rate Route Frequency Ordered Stop   04/10/11 1200   vancomycin (VANCOCIN) 500 mg in sodium chloride 0.9 % 100 mL IVPB        500 mg 100 mL/hr over 60 Minutes Intravenous Every T-Th-Sa (Hemodialysis) 04/09/11 1901     04/09/11 2100  piperacillin-tazobactam (ZOSYN) IVPB 2.25 g       2.25 g 100 mL/hr over 30 Minutes Intravenous 3 times per day 04/09/11 2031     04/09/11 2000   piperacillin-tazobactam (ZOSYN) IVPB 2.25 g  Status:  Discontinued        2.25 g 66.7 mL/hr over 30 Minutes Intravenous 3 times per day 04/09/11 1804 04/09/11 2030   04/09/11 1427   piperacillin-tazobactam (ZOSYN) IVPB 2.25 g  Status:  Discontinued        2.25 g 100 mL/hr over 30 Minutes Intravenous Every 8 hours 04/09/11 1113 04/09/11 1817   04/09/11  1230   vancomycin (VANCOCIN) 1,250 mg in sodium chloride 0.9 % 250 mL IVPB        1,250 mg 166.7 mL/hr over 90 Minutes Intravenous  Once 04/09/11 1228 04/09/11 1719   04/09/11 1115   vancomycin (VANCOCIN) injection 1 g  Status:  Discontinued        1 g Intravenous  Once 04/09/11 1113 04/09/11 1226   04/09/11 1115   ciprofloxacin (CIPRO) IVPB 400 mg        400 mg 200 mL/hr over 60 Minutes Intravenous  Once 04/09/11 1113 04/09/11 1250          Assessment: [name] with ESRD and hx liver transplant on Vancomycin Rx/Zosyn MD for empiric HCAP coverage. The patient was loaded with Vancomycin on 2/27 and patient tolerated ~3hr HD session on 2/28 at BFR 400 cc/min and received her appropriate maintenance dose. Currently the patient is on her home HD schedule of TThSat -- patient is Afebrile, WBC WNL. Doses remain appropriate. No need to check a level at  this time.   Goal of Therapy:  Pre-HD Vancomycin level 15-25 mcg/ml  Plan:  1. Continue Vancomycin 500 mg IV post HD sessions 2. Will continue to follow HD schedule/duration, culture results, LOT, and antibiotic de-escalation plans   [name], PharmD, BCPS Clinical Pharmacist Pager: [phone] 04/11/2011 9:33 AM

## 2011-04-11 NOTE — Progress Notes (Addendum)
[facility] Progress Note Subjective:  Fevers resolving nicely, SOB resolved as well. Only complaint today is nausea, but keeping food down.   Objective Filed Vitals:   04/10/11 1829 04/10/11 2123 04/11/11 0419 04/11/11 0840  BP: 108/61 114/54 108/71 134/73  Pulse: 81 77 67 74  Temp: 98.3 F (36.8 C) 98.2 F (36.8 C) 97.9 F (36.6 C) 98 F (36.7 C)  TempSrc: Oral Oral Oral Oral  Resp: 16 18 18 17   Height:      Weight:  59.2 kg (130 lb 8.2 oz)    SpO2: 99% 100% 99% 95%   Physical Exam: General: no distress, calm Heart: RRR Lungs: CTAB today Abdomen: distended + mild ascites Extremities: no LE edema Dialysis Access: right fem cath and left thigh graft = thrill and bruit; well healed; thickened scar tissue in groin no drainage.  Dialysis Orders: Center:[facility] on TTS .  EDW 59 HD Bath2 K 2.25 Ca Time 3.5 Heparin none. Access R I-J BFR 400 DFR A1.5  Zemplar IV/HD Epogen just d/c due to Hgb of 12; was 6600 Units IV/HD Venofer 50/weekOther   Problem/Plan: 1. Resp distress due to PNA and pulm edema- much better with abx and HD.  2. ESRD - TTS of schedule.  HD tomorrow, euvolemic. Admitted at 65 kg, dry wt was 59, lowest weight here was 56.4.  Probably needs dry wt 56 or lower.  3 Hypertension/volume - improved.  4. Anemia - Hgb > 12; no ESA; weekly IV Fe  5. Metabolic bone disease - P elevated ( at [facility]); usually much better; resume zemplar when P comes down;pt states she takes phoslyra "to the top line" Changed to 30cc/meal in November by Dr. Hyman Hopes - not on med list; this is = to 6 phoslo which is quite a lot., but this could explain why P is so high; start with 20 cc phoslyra/meal = 4 phoslo; check P pre HD 6. Nutrition - renal diet  7. 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    8. Constipation - need to resume usual meds; she was started on Lubiprostone (amitiza) 8  mg bid at last [facility] d/c  9. Thrombocytopenia - plts 112K (140s at [facility]) - follow  10. Diabetes mellitus- on very low dose of insulin -CBG ok  Additional Objective Labs: Basic Metabolic Panel:  Lab 04/11/11 4098 04/10/11 0542 04/09/11 0750  NA 138 136 138  K 3.9 4.1 5.8*  CL 99 98 103  CO2 25 23 17*  GLUCOSE 153* 157* 131*  BUN 25* 35* 62*  CREATININE 4.63* 5.96* 9.50*  CALCIUM 9.7 8.6 9.3  ALB -- -- --  PHOS -- -- --   Liver Function Tests:  Lab 04/10/11 0542 04/09/11 0750  AST 68* 86*  ALT 71* 96*  ALKPHOS 187* 225*  BILITOT 1.1 0.6  PROT 6.1 6.9  ALBUMIN 2.4* 2.8*  CBC:  Lab 04/09/11 0750  WBC 7.7  NEUTROABS 5.2  HGB 12.3  HCT 38.3  MCV 91.4  PLT 112*   Blood Culture    Component Value Date/Time   SDES BLOOD HAND RIGHT 04/09/2011 0823   SPECREQUEST BOTTLES DRAWN AEROBIC ONLY 2CC 04/09/2011 0823   CULT        BLOOD CULTURE RECEIVED NO GROWTH TO DATE CULTURE WILL BE HELD FOR 5 DAYS BEFORE ISSUING A FINAL NEGATIVE REPORT 04/09/2011 0823   REPTSTATUS PENDING  04/09/2011 0823    Cardiac Enzymes:  Lab 04/09/11 0750  CKTOTAL 39  CKMB 2.2  CKMBINDEX --  TROPONINI <0.30   CBG:  Lab 04/11/11 1126 04/11/11 0732 04/10/11 2126 04/10/11 1827 04/10/11 1153  GLUCAP 115* 114* 224* 154* 204*  Studies/Results: Dg Chest 2 View  04/10/2011  *RADIOLOGY REPORT*  Clinical Data: Pleural effusions.  CHEST - 2 VIEW  Comparison: 04/09/2011  Findings: Bilateral pleural effusions have decreased and the heart size and vascularity are normal.  No residual pulmonary edema. Double-lumen central catheter in place.  IMPRESSION:  1.  Resolution of mild pulmonary edema. 2.  Decreasing small bilateral pleural effusions.  Original Report Authenticated By: Gwynn Burly, M.D.   Medications:      . aspirin EC  81 mg Oral Daily  . calcium acetate (Phos Binder)  2,668 mg Oral TID WC  . calcium carbonate  1 tablet Oral Daily  . docusate sodium  100 mg Oral BID  . DULoxetine  60 mg  Oral Daily  . ferric gluconate (FERRLECIT/NULECIT) IV  62.5 mg Intravenous Q Fri  . folic acid  1 mg Oral Daily  . heparin  5,000 Units Subcutaneous Q8H  . insulin aspart  0-9 Units Subcutaneous TID WC  . levothyroxine  200 mcg Oral Q0600  . lisinopril  20 mg Oral QHS  . metoprolol succinate  25 mg Oral Custom  . multivitamin  1 tablet Oral QHS  . pantoprazole  40 mg Oral Q1200  . piperacillin-tazobactam (ZOSYN)  IV  2.25 g Intravenous Q8H  . polyethylene glycol  17 g Oral BID  . senna  1 tablet Oral Q2000  . sodium chloride  3 mL Intravenous Q12H  . sucralfate  1 g Oral BID  . tacrolimus  5 mg Oral BID  . vancomycin  500 mg Intravenous Q T,Th,Sa-HD    I  have reviewed scheduled and prn medications.  Vinson Moselle  MD [employer] [phone] pgr    [phone] cell 04/11/2011, 1:21 PM          .

## 2011-04-11 NOTE — Progress Notes (Signed)
Subjective: Patient seen and examined this am. C/o nausea but no vomiting. SOB much better.   Objective:  Vital signs in last 24 hours:  Filed Vitals:   04/10/11 2123 04/11/11 0419 04/11/11 0840 04/11/11 1311  BP: 114/54 108/71 134/73 160/87  Pulse: 77 67 74 69  Temp: 98.2 F (36.8 C) 97.9 F (36.6 C) 98 F (36.7 C) 97.6 F (36.4 C)  TempSrc: Oral Oral Oral Oral  Resp: 18 18 17 17   Height:      Weight: 59.2 kg (130 lb 8.2 oz)     SpO2: 100% 99% 95% 100%    Intake/Output from previous day:   Intake/Output Summary (Last 24 hours) at 04/11/11 1609 Last data filed at 04/11/11 1300  Gross per 24 hour  Intake    660 ml  Output   4679 ml  Net  -4019 ml    Physical Exam:  General:elderly thin built in no acute distress.  HEENT: no pallor, icteric, moist oral mucosa, no JVD, no lymphadenopathy  Heart: Normal s1 &s2 Regular rate and rhythm, without murmurs, rubs, gallops.  Lungs: diminished breath sounds at bases  Abdomen: Soft, nontender, nondistended, positive bowel sounds.  Extremities: No clubbing cyanosis or edema with positive pedal pulses.  Neuro: Alert, awake, oriented x3, nonfocal.   Lab Results:  Basic Metabolic Panel:    Component Value Date/Time   NA 138 04/11/2011 0530   K 3.9 04/11/2011 0530   CL 99 04/11/2011 0530   CO2 25 04/11/2011 0530   BUN 25* 04/11/2011 0530   CREATININE 4.63* 04/11/2011 0530   GLUCOSE 153* 04/11/2011 0530   CALCIUM 9.7 04/11/2011 0530   CBC:    Component Value Date/Time   WBC 7.7 04/09/2011 0750   HGB 12.3 04/09/2011 0750   HCT 38.3 04/09/2011 0750   PLT 112* 04/09/2011 0750   MCV 91.4 04/09/2011 0750   NEUTROABS 5.2 04/09/2011 0750   LYMPHSABS 1.5 04/09/2011 0750   MONOABS 0.9 04/09/2011 0750   EOSABS 0.1 04/09/2011 0750   BASOSABS 0.0 04/09/2011 0750    Recent Results (from the past 240 hour(s))  CULTURE, BLOOD (ROUTINE X 2)     Status: Normal (Preliminary result)   Collection Time   04/09/11  7:50 AM      Component Value Range Status  Comment   Specimen Description BLOOD NECK   Final    Special Requests     Final    Value: BOTTLES DRAWN AEROBIC AND ANAEROBIC BLUE 10CC RED 3CC EJ IN NECK   Culture  Setup Time [id number]   Final    Culture     Final    Value:        BLOOD CULTURE RECEIVED NO GROWTH TO DATE CULTURE WILL BE HELD FOR 5 DAYS BEFORE ISSUING A FINAL NEGATIVE REPORT   Report Status PENDING   Incomplete   CULTURE, BLOOD (ROUTINE X 2)     Status: Normal (Preliminary result)   Collection Time   04/09/11  8:23 AM      Component Value Range Status Comment   Specimen Description BLOOD HAND RIGHT   Final    Special Requests BOTTLES DRAWN AEROBIC ONLY 2CC   Final    Culture  Setup Time [id number]   Final    Culture     Final    Value:        BLOOD CULTURE RECEIVED NO GROWTH TO DATE CULTURE WILL BE HELD FOR 5 DAYS BEFORE ISSUING A FINAL NEGATIVE REPORT  Report Status PENDING   Incomplete     Studies/Results: Dg Chest 2 View  04/10/2011  *RADIOLOGY REPORT*  Clinical Data: Pleural effusions.  CHEST - 2 VIEW  Comparison: 04/09/2011  Findings: Bilateral pleural effusions have decreased and the heart size and vascularity are normal.  No residual pulmonary edema. Double-lumen central catheter in place.  IMPRESSION:  1.  Resolution of mild pulmonary edema. 2.  Decreasing small bilateral pleural effusions.  Original Report Authenticated By: Gwynn Burly, M.D.    Medications: Scheduled Meds:   . aspirin EC  81 mg Oral Daily  . calcium acetate (Phos Binder)  2,668 mg Oral TID WC  . calcium carbonate  1 tablet Oral Daily  . docusate sodium  100 mg Oral BID  . DULoxetine  60 mg Oral Daily  . ferric gluconate (FERRLECIT/NULECIT) IV  62.5 mg Intravenous Q Fri  . folic acid  1 mg Oral Daily  . heparin  5,000 Units Subcutaneous Q8H  . insulin aspart  0-9 Units Subcutaneous TID WC  . levothyroxine  200 mcg Oral Q0600  . lisinopril  20 mg Oral QHS  . metoprolol succinate  25 mg Oral Custom  . multivitamin  1 tablet  Oral QHS  . pantoprazole  40 mg Oral Q1200  . piperacillin-tazobactam (ZOSYN)  IV  2.25 g Intravenous Q8H  . polyethylene glycol  17 g Oral BID  . senna  1 tablet Oral Q2000  . sodium chloride  3 mL Intravenous Q12H  . sucralfate  1 g Oral BID  . tacrolimus  5 mg Oral BID  . vancomycin  500 mg Intravenous Q T,Th,Sa-HD   Continuous Infusions:  PRN Meds:.sodium chloride, sodium chloride, sodium chloride, albuterol, albuterol, albuterol, calcium carbonate (dosed in mg elemental calcium), camphor-menthol, docusate sodium, feeding supplement (NEPRO CARB STEADY), heparin, heparin, hydrOXYzine, lidocaine, lidocaine-prilocaine, ondansetron (ZOFRAN) IV, ondansetron, ondansetron, pentafluoroprop-tetrafluoroeth, promethazine, sodium chloride, sorbitol, traMADol, DISCONTD: heparin  Assessment/  56 y/o AA female with hx of HTN, DM, hepc cirrhosis s/p liver trasnplant in 2011 with ESRD on HD ( Tu, th, sat) secondary to liver transplant related complications, hypothyroidism who missed HD yesterday sent to ED from HD today as she was short of breath , febrile and CXR showing b/l PNA   Plan  *Healthcare-associated pneumonia  Recently hospitalized to [facility] for GI symptoms  Patient febrile with bibasilar opacity on CXR  treating empirically with IV vanco and zosyn ( day 3)  blood cx negative Albuterol nebs prn  symptoms much improved  after HD   Shortness of breath  associated with HCAP and missing HD  Has improved markedly after HD   End stage renal disease  On HD M,W,F   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  follows at [facility]  on prograf which will be continued  As per renal note she was recently as [facility] less than a week back for abd pain , nausea and  vomiting with liver bx showing recurrence of hep c , inconclusive for transplant rejection  Mild transaminitis noted on admission , do not have recent lab to compare  avoid tylenol or benzos   Hypothyroidism  cont synthroid      Nausea  cont zofran prn  Diabetes mellitus type 2  cont SSI   Anemia of chronic ds  stable   TOBACCO ABUSE  Counseled on smoking cessation   Hypertension  cont home meds   Hyperkalemia  Stable post HD   DVT prophylaxis  Full code   LOS: 2 days   Edee Nifong 04/11/2011, 4:09 PM

## 2011-04-12 ENCOUNTER — Inpatient Hospital Stay (HOSPITAL_COMMUNITY): Payer: Self-pay

## 2011-04-12 DIAGNOSIS — N186 End stage renal disease: Secondary | ICD-10-CM | POA: Diagnosis not present

## 2011-04-12 LAB — RENAL FUNCTION PANEL
BUN: 26 mg/dL — ABNORMAL HIGH (ref 6–23)
CO2: 26 mEq/L (ref 19–32)
GFR calc Af Amer: 11 mL/min — ABNORMAL LOW (ref >90)
Glucose, Bld: 148 mg/dL — ABNORMAL HIGH (ref 70–99)
Phosphorus: 3.5 mg/dL (ref 2.3–4.6)
Potassium: 3.5 mEq/L (ref 3.5–5.1)

## 2011-04-12 LAB — BASIC METABOLIC PANEL
CO2: 23 mEq/L (ref 19–32)
Calcium: 10.2 mg/dL (ref 8.4–10.5)
Chloride: 98 mEq/L (ref 96–112)
Creatinine, Ser: 6.44 mg/dL — ABNORMAL HIGH (ref 0.50–1.10)
Glucose, Bld: 146 mg/dL — ABNORMAL HIGH (ref 70–99)

## 2011-04-12 LAB — CBC
HCT: 36.1 % (ref 36.0–46.0)
Hemoglobin: 11.8 g/dL — ABNORMAL LOW (ref 12.0–15.0)
MCH: 29.9 pg (ref 26.0–34.0)
MCHC: 32.7 g/dL (ref 30.0–36.0)
RBC: 3.95 MIL/uL (ref 3.87–5.11)

## 2011-04-12 MED ORDER — LEVOFLOXACIN 500 MG PO TABS: 500.0000 mg | ORAL_TABLET | ORAL | Status: AC

## 2011-04-12 NOTE — Discharge Summary (Signed)
Patient ID: [name] MRN: [id number] DOB/AGE: [birth year] 56 y.o.  Admit date: 04/09/2011 Discharge date: 04/12/2011  Primary Care Physician:  [name], MD, MD  Discharge Diagnoses:    Principal Problem:  *Healthcare-associated pneumonia  Active Problems:  End stage renal disease  HEPATITIS C, s/p liver transplant in 2011  HYPOTHYROIDISM  DIABETES MELLITUS, TYPE II  ANEMIA, CHRONIC  HYPERTENSION  GERD  CIRRHOSIS  Hyperkalemia   Medication List  As of 04/12/2011 11:53 AM   TAKE these medications         albuterol 90 MCG/ACT inhaler   Commonly known as: PROVENTIL,VENTOLIN   Inhale 2 puffs into the lungs every 4 (four) hours as needed. For shortness of breath      aspirin EC 81 MG tablet   Take 81 mg by mouth daily.      calcium carbonate 500 MG chewable tablet   Commonly known as: TUMS - dosed in mg elemental calcium   Chew 1 tablet by mouth daily.      docusate sodium 100 MG capsule   Commonly known as: COLACE   Take 100 mg by mouth 2 (two) times daily.      DULoxetine 60 MG capsule   Commonly known as: CYMBALTA   Take 60 mg by mouth daily.      FOLIC ACID PO   Take 0.4 mg by mouth daily.      gabapentin 600 MG tablet   Commonly known as: NEURONTIN   Take 600 mg by mouth at bedtime.      insulin detemir 100 UNIT/ML injection   Commonly known as: LEVEMIR   Inject 2 Units into the skin at bedtime as needed. Uses only if blood sugar levels are over 150      levofloxacin 500 MG tablet   Commonly known as: LEVAQUIN   Take 1 tablet (500 mg total) by mouth every other day.      levothyroxine 200 MCG tablet   Commonly known as: SYNTHROID, LEVOTHROID   Take 200 mcg by mouth daily.      lisinopril 20 MG tablet   Commonly known as: PRINIVIL,ZESTRIL   Take 20 mg by mouth at bedtime.      metoprolol succinate 25 MG 24 hr tablet   Commonly known as: TOPROL-XL   Take 25 mg by mouth 2 (two) times daily. Take once daily on dialysis days, Tuesday, Thursday  and Saturday      omeprazole 20 MG capsule   Commonly known as: PRILOSEC   Take 20 mg by mouth daily.      ondansetron 4 MG disintegrating tablet   Commonly known as: ZOFRAN-ODT   Take 4 mg by mouth every 8 (eight) hours as needed. nausea      polyethylene glycol packet   Commonly known as: MIRALAX / GLYCOLAX   Take 17 g by mouth 2 (two) times daily. For constipation      promethazine 12.5 MG tablet   Commonly known as: PHENERGAN   Take 12.5 mg by mouth every 6 (six) hours as needed. For nausea      senna 8.6 MG tablet   Commonly known as: SENOKOT   Take 1 tablet by mouth every evening.      sucralfate 1 G tablet   Commonly known as: CARAFATE   Take 1 g by mouth 2 (two) times daily.      tacrolimus 1 MG capsule   Commonly known as: PROGRAF   Take 5 mg by mouth 2 (two)  times daily.      traMADol 50 MG tablet   Commonly known as: ULTRAM   Take 50 mg by mouth every 6 (six) hours as needed. pain            Disposition and Follow-up:  Follow up with PCP in 1 week  follow up with scheduled HD  Consults:   [facility]  Significant Diagnostic Studies:  Dg Chest 2 View  04/09/2011  *RADIOLOGY REPORT*  Clinical Data: Shortness of breath and chills.  CHEST - 2 VIEW  Comparison: 12/23/2010.  Findings: Trachea is midline.  Heart size normal.  Right IJ dialysis catheter tips project over the SVC and right atrium, stable.  There is bibasilar air space disease and small bilateral pleural effusions.  IMPRESSION: Bibasilar air space disease and small bilateral pleural effusions.  Original Report Authenticated By: Reyes Ivan, M.D.    Brief H and P: For complete details please refer to admission H and P, but in brief 56 y/o AA female with hx of HTN, DM, hepc cirrhosis s/p liver trasnplant in 2011 with ESRD on HD ( Tu, th, sat) secondary to liver transplant related complications, hypothyroidism who missed HD yesterday and came for HD today was sent to ED after being noticed  a temp of 101.3 and shortness of breath. As per pt she missed her HD yesterday as she was with her daughter in ED. Since last evening she was having shortness of breath with subjective fever and chills. She was progressively short of breath with cough with whitish phlegm and generalized body aches. Patient noted to be febrile and CR showing bilateral opacities suggestive of PNA.   Physical Exam on Discharge:  Filed Vitals:   04/12/11 1030 04/12/11 1100 04/12/11 1121 04/12/11 1130  BP: 107/56 125/66 93/56 105/52  Pulse: 72 72  83  Temp:      TempSrc:      Resp: 18 18  16   Height:      Weight:      SpO2:         Intake/Output Summary (Last 24 hours) at 04/12/11 1153 Last data filed at 04/12/11 0658  Gross per 24 hour  Intake    460 ml  Output      0 ml  Net    460 ml    General:elderly thin built in no acute distress.  HEENT: no pallor, icteric, moist oral mucosa, no JVD, no lymphadenopathy  Heart: Normal s1 &s2 Regular rate and rhythm, without murmurs, rubs, gallops.  Lungs: diminished breath sounds at bases  Abdomen: Soft, nontender, nondistended, positive bowel sounds.  Extremities: No clubbing cyanosis or edema with positive pedal pulses.  Neuro: Alert, awake, oriented x3, nonfocal.  CBC:    Component Value Date/Time   WBC 3.3* 04/12/2011 0930   HGB 11.8* 04/12/2011 0930   HCT 36.1 04/12/2011 0930   PLT 80* 04/12/2011 0930   MCV 91.4 04/12/2011 0930   NEUTROABS 5.2 04/09/2011 0750   LYMPHSABS 1.5 04/09/2011 0750   MONOABS 0.9 04/09/2011 0750   EOSABS 0.1 04/09/2011 0750   BASOSABS 0.0 04/09/2011 0750    Basic Metabolic Panel:    Component Value Date/Time   NA 140 04/12/2011 0930   K 3.5 04/12/2011 0930   CL 99 04/12/2011 0930   CO2 26 04/12/2011 0930   BUN 26* 04/12/2011 0930   CREATININE 4.89* 04/12/2011 0930   GLUCOSE 148* 04/12/2011 0930   CALCIUM 9.5 04/12/2011 0930    Hospital Course:  *Healthcare-associated  pneumonia  Recently hospitalized to [facility] for GI symptoms  Patient was  febrile with bibasilar opacity on CXR  treating empirically with IV vanco and zosyn on day 4 now blood cx negative  Albuterol nebs prn  symptoms much improved after HD   Shortness of breath  associated with HCAP and missing HD  Has improved markedly after HD no recent echo in system and would benefit from getting a 2 D Echo if not done recently to assess LV function   End stage renal disease  Seen by renal consult and had scheduled HD. 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  follows at [facility]  on prograf which will be continued  As per renal note she was recently as [facility] less than a week back for abd pain , nausea and vomiting with liver bx showing recurrence of hep c , inconclusive for transplant rejection  Mild transaminitis noted on admission , do not have recent lab to compare  avoid tylenol or benzos   Hypothyroidism  cont synthroid     Diabetes mellitus type 2  cont SSI   Anemia of chronic ds  stable   Low platelets  patient given heparin with HD and has been stopped should be monitored on next HD  TOBACCO ABUSE  Counseled on smoking cessation   Hypertension  Stable cont home meds   Hyperkalemia  Stable post HD    patient clinically stable for discharge home with outpt follow up.    Time spent on Discharge: 45 minutes  Signed: Eddie North 04/12/2011, 11:53 AM

## 2011-04-12 NOTE — Progress Notes (Signed)
Subjective:  Feeling better; remains afebrile; breathing better; still having some nausea (last night) w/o vomiting  Vital signs in last 24 hours: Filed Vitals:   04/12/11 0853 04/12/11 0858 04/12/11 0930 04/12/11 1000  BP: 176/84 181/88 146/70 137/63  Pulse: 64 65 70 72  Temp:  97.5 F (36.4 C)    TempSrc:  Oral    Resp: 18 20 18 18   Height:      Weight:      SpO2: 100% 100%     Weight change:   Intake/Output Summary (Last 24 hours) at 04/12/11 1013 Last data filed at 04/12/11 0658  Gross per 24 hour  Intake    460 ml  Output      0 ml  Net    460 ml   Labs: Basic Metabolic Panel:  Lab 04/12/11 4098 04/11/11 0530 04/10/11 0542  NA 138 138 136  K 4.3 3.9 4.1  CL 98 99 98  CO2 23 25 23   GLUCOSE 146* 153* 157*  BUN 36* 25* 35*  CREATININE 6.44* 4.63* 5.96*  CALCIUM 10.2 9.7 8.6  ALB -- -- --  PHOS -- -- --   Liver Function Tests:  Lab 04/10/11 0542 04/09/11 0750  AST 68* 86*  ALT 71* 96*  ALKPHOS 187* 225*  BILITOT 1.1 0.6  PROT 6.1 6.9  ALBUMIN 2.4* 2.8*   No results found for this basename: LIPASE:3,AMYLASE:3 in the last 168 hours No results found for this basename: AMMONIA:3 in the last 168 hours CBC:  Lab 04/12/11 0930 04/09/11 0750  WBC 3.3* 7.7  NEUTROABS -- 5.2  HGB 11.8* 12.3  HCT 36.1 38.3  MCV 91.4 91.4  PLT 80* 112*   Cardiac Enzymes:  Lab 04/09/11 0750  CKTOTAL 39  CKMB 2.2  CKMBINDEX --  TROPONINI <0.30   CBG:  Lab 04/12/11 0722 04/11/11 2127 04/11/11 1633 04/11/11 1126 04/11/11 0732  GLUCAP 133* 184* 208* 115* 114*    Iron Studies: No results found for this basename: IRON,TIBC,TRANSFERRIN,FERRITIN in the last 72 hours Studies/Results: No results found. Medications:      . aspirin EC  81 mg Oral Daily  . calcium acetate (Phos Binder)  2,668 mg Oral TID WC  . calcium carbonate  1 tablet Oral Daily  . docusate sodium  100 mg Oral BID  . DULoxetine  60 mg Oral Daily  . ferric gluconate (FERRLECIT/NULECIT) IV  62.5 mg  Intravenous Q Fri  . folic acid  1 mg Oral Daily  . heparin  5,000 Units Subcutaneous Q8H  . insulin aspart  0-9 Units Subcutaneous TID WC  . levothyroxine  200 mcg Oral Q0600  . lisinopril  20 mg Oral QHS  . metoprolol succinate  25 mg Oral Custom  . multivitamin  1 tablet Oral QHS  . pantoprazole  40 mg Oral Q1200  . piperacillin-tazobactam (ZOSYN)  IV  2.25 g Intravenous Q8H  . polyethylene glycol  17 g Oral BID  . senna  1 tablet Oral Q2000  . sodium chloride  3 mL Intravenous Q12H  . sucralfate  1 g Oral BID  . tacrolimus  5 mg Oral BID  . vancomycin  500 mg Intravenous Q T,Th,Sa-HD    I  have reviewed scheduled and prn medications.  Physical Exam:  General: comfortable  Heart: RRR, 2/6 SEM  Lungs: CTAB, diminished bases Abdomen: soft, + mild ascites, +BS  Extremities: no LE edema  Dialysis Access: right fem cath and maturing left thigh graft +T/B  Problem/Plan:  1. Resp distress due to PNA and pulm edema- much better with Zosyn and Vancomycin and  HD.  2. ESRD - TTS of schedule. Seen on HD today; tol well. outpt eDW (56kg); will probably need adjusted when discharged 3. Hypokalemia- K 3.5; changed to 4K per protocol; prev 4.3; follow closely 3 Hypertension/volume - improved with fluid removal; same meds 4. Anemia - Hgb 11.8; no ESA; weekly IV Fe; follow trend  5. Metabolic bone disease - phos 3.5, ca 9.5; improved with resuming phoslyra and Tums on adm; follow closely  6. Nutrition - albumin 2.8; ^ protein renal diet; follow  7. Liver transplant with ^ LFTs, recent bx suggestive of recurrent mild hep C - followed at [facility] - need to continue transplant meds.  8. Constipation -improved on Miralax 9. Thrombocytopenia - plts now 80K; no Heparin with HD; no active bleeding; follow closely 10. Diabetes mellitus- CBG sand SSI; per primary services 11. Disposition- per primary services  Samuel Germany, FNP-C [facility] Pager [phone]  04/12/2011,10:13  AM  LOS: 3 days   Patient seen and examined and agree with assessment and plan as above.   Vinson Moselle  MD [facility] [phone] pgr    [phone] cell 04/12/2011, 4:06 PM

## 2011-04-14 LAB — GLUCOSE, CAPILLARY: Glucose-Capillary: 166 mg/dL — ABNORMAL HIGH (ref 70–99)

## 2011-04-14 NOTE — Progress Notes (Signed)
   CARE MANAGEMENT NOTE 04/14/2011  Patient:  [name], [name]   Account Number:  [id number]  Date Initiated:  04/14/2011  Documentation initiated by:  Letha Cape  Subjective/Objective Assessment:   dx pna  admit     Action/Plan:   Anticipated DC Date:  04/12/2011   Anticipated DC Plan:  HOME/SELF CARE      DC Planning Services  CM consult      Choice offered to / List presented to:             Status of service:  Completed, signed off Medicare Important Message given?   (If response is "NO", the following Medicare IM given date fields will be blank) Date Medicare IM given:   Date Additional Medicare IM given:    Discharge Disposition:  HOME/SELF CARE  Per UR Regulation:    Comments:  04/14/11 10:16 Letha Cape RN, BSN [phone] patient independent pta, pt dc to home .

## 2011-04-15 DIAGNOSIS — N186 End stage renal disease: Secondary | ICD-10-CM | POA: Diagnosis not present

## 2011-04-15 DIAGNOSIS — D539 Nutritional anemia, unspecified: Secondary | ICD-10-CM | POA: Diagnosis not present

## 2011-04-15 DIAGNOSIS — D509 Iron deficiency anemia, unspecified: Secondary | ICD-10-CM | POA: Diagnosis not present

## 2011-04-15 DIAGNOSIS — N2581 Secondary hyperparathyroidism of renal origin: Secondary | ICD-10-CM | POA: Diagnosis not present

## 2011-04-15 LAB — CULTURE, BLOOD (ROUTINE X 2)
Culture  Setup Time: 201302271348
Culture: NO GROWTH

## 2011-04-17 DIAGNOSIS — I2510 Atherosclerotic heart disease of native coronary artery without angina pectoris: Secondary | ICD-10-CM | POA: Diagnosis present

## 2011-04-17 DIAGNOSIS — N19 Unspecified kidney failure: Secondary | ICD-10-CM | POA: Diagnosis not present

## 2011-04-17 DIAGNOSIS — R161 Splenomegaly, not elsewhere classified: Secondary | ICD-10-CM | POA: Diagnosis not present

## 2011-04-17 DIAGNOSIS — Z992 Dependence on renal dialysis: Secondary | ICD-10-CM | POA: Diagnosis not present

## 2011-04-17 DIAGNOSIS — K5900 Constipation, unspecified: Secondary | ICD-10-CM | POA: Diagnosis not present

## 2011-04-17 DIAGNOSIS — N186 End stage renal disease: Secondary | ICD-10-CM | POA: Diagnosis not present

## 2011-04-17 DIAGNOSIS — K5902 Outlet dysfunction constipation: Secondary | ICD-10-CM | POA: Diagnosis not present

## 2011-04-17 DIAGNOSIS — I10 Essential (primary) hypertension: Secondary | ICD-10-CM | POA: Diagnosis not present

## 2011-04-17 DIAGNOSIS — Z944 Liver transplant status: Secondary | ICD-10-CM | POA: Diagnosis not present

## 2011-04-17 DIAGNOSIS — I499 Cardiac arrhythmia, unspecified: Secondary | ICD-10-CM | POA: Diagnosis not present

## 2011-04-17 DIAGNOSIS — K219 Gastro-esophageal reflux disease without esophagitis: Secondary | ICD-10-CM | POA: Diagnosis present

## 2011-04-17 DIAGNOSIS — E039 Hypothyroidism, unspecified: Secondary | ICD-10-CM | POA: Diagnosis not present

## 2011-04-17 DIAGNOSIS — F329 Major depressive disorder, single episode, unspecified: Secondary | ICD-10-CM | POA: Diagnosis present

## 2011-04-17 DIAGNOSIS — I120 Hypertensive chronic kidney disease with stage 5 chronic kidney disease or end stage renal disease: Secondary | ICD-10-CM | POA: Diagnosis not present

## 2011-04-17 DIAGNOSIS — E872 Acidosis: Secondary | ICD-10-CM | POA: Diagnosis not present

## 2011-04-17 DIAGNOSIS — R109 Unspecified abdominal pain: Secondary | ICD-10-CM | POA: Diagnosis not present

## 2011-04-17 DIAGNOSIS — M545 Low back pain: Secondary | ICD-10-CM | POA: Diagnosis present

## 2011-04-17 DIAGNOSIS — R112 Nausea with vomiting, unspecified: Secondary | ICD-10-CM | POA: Diagnosis not present

## 2011-04-17 DIAGNOSIS — R1115 Cyclical vomiting syndrome unrelated to migraine: Secondary | ICD-10-CM | POA: Diagnosis not present

## 2011-04-17 DIAGNOSIS — E119 Type 2 diabetes mellitus without complications: Secondary | ICD-10-CM | POA: Diagnosis present

## 2011-04-24 DIAGNOSIS — F329 Major depressive disorder, single episode, unspecified: Secondary | ICD-10-CM | POA: Diagnosis not present

## 2011-04-24 DIAGNOSIS — F330 Major depressive disorder, recurrent, mild: Secondary | ICD-10-CM | POA: Diagnosis not present

## 2011-04-24 DIAGNOSIS — Z944 Liver transplant status: Secondary | ICD-10-CM | POA: Diagnosis not present

## 2011-04-24 DIAGNOSIS — F17200 Nicotine dependence, unspecified, uncomplicated: Secondary | ICD-10-CM | POA: Diagnosis not present

## 2011-04-24 DIAGNOSIS — F411 Generalized anxiety disorder: Secondary | ICD-10-CM | POA: Diagnosis not present

## 2011-04-24 DIAGNOSIS — I120 Hypertensive chronic kidney disease with stage 5 chronic kidney disease or end stage renal disease: Secondary | ICD-10-CM | POA: Diagnosis not present

## 2011-04-24 DIAGNOSIS — Z992 Dependence on renal dialysis: Secondary | ICD-10-CM | POA: Diagnosis not present

## 2011-04-24 DIAGNOSIS — N186 End stage renal disease: Secondary | ICD-10-CM | POA: Diagnosis not present

## 2011-04-24 DIAGNOSIS — Z79899 Other long term (current) drug therapy: Secondary | ICD-10-CM | POA: Diagnosis not present

## 2011-05-01 DIAGNOSIS — Z79899 Other long term (current) drug therapy: Secondary | ICD-10-CM | POA: Diagnosis not present

## 2011-05-01 DIAGNOSIS — B1910 Unspecified viral hepatitis B without hepatic coma: Secondary | ICD-10-CM | POA: Diagnosis not present

## 2011-05-02 DIAGNOSIS — N186 End stage renal disease: Secondary | ICD-10-CM | POA: Diagnosis not present

## 2011-05-05 DIAGNOSIS — Z944 Liver transplant status: Secondary | ICD-10-CM | POA: Diagnosis not present

## 2011-05-05 DIAGNOSIS — K5902 Outlet dysfunction constipation: Secondary | ICD-10-CM | POA: Diagnosis not present

## 2011-05-05 DIAGNOSIS — E119 Type 2 diabetes mellitus without complications: Secondary | ICD-10-CM | POA: Diagnosis not present

## 2011-05-05 DIAGNOSIS — Z992 Dependence on renal dialysis: Secondary | ICD-10-CM | POA: Diagnosis not present

## 2011-05-05 DIAGNOSIS — N186 End stage renal disease: Secondary | ICD-10-CM | POA: Diagnosis not present

## 2011-05-05 DIAGNOSIS — E039 Hypothyroidism, unspecified: Secondary | ICD-10-CM | POA: Diagnosis not present

## 2011-05-11 DIAGNOSIS — N186 End stage renal disease: Secondary | ICD-10-CM | POA: Diagnosis not present

## 2011-05-13 DIAGNOSIS — D509 Iron deficiency anemia, unspecified: Secondary | ICD-10-CM | POA: Diagnosis not present

## 2011-05-13 DIAGNOSIS — N2581 Secondary hyperparathyroidism of renal origin: Secondary | ICD-10-CM | POA: Diagnosis not present

## 2011-05-13 DIAGNOSIS — E119 Type 2 diabetes mellitus without complications: Secondary | ICD-10-CM | POA: Diagnosis not present

## 2011-05-13 DIAGNOSIS — N186 End stage renal disease: Secondary | ICD-10-CM | POA: Diagnosis not present

## 2011-05-13 DIAGNOSIS — D631 Anemia in chronic kidney disease: Secondary | ICD-10-CM | POA: Diagnosis not present

## 2011-05-19 DIAGNOSIS — E1059 Type 1 diabetes mellitus with other circulatory complications: Secondary | ICD-10-CM | POA: Diagnosis not present

## 2011-05-19 DIAGNOSIS — Q828 Other specified congenital malformations of skin: Secondary | ICD-10-CM | POA: Diagnosis not present

## 2011-05-20 DIAGNOSIS — R296 Repeated falls: Secondary | ICD-10-CM | POA: Diagnosis not present

## 2011-05-20 DIAGNOSIS — I10 Essential (primary) hypertension: Secondary | ICD-10-CM | POA: Diagnosis not present

## 2011-05-20 DIAGNOSIS — E079 Disorder of thyroid, unspecified: Secondary | ICD-10-CM | POA: Diagnosis not present

## 2011-05-20 DIAGNOSIS — M25559 Pain in unspecified hip: Secondary | ICD-10-CM | POA: Diagnosis not present

## 2011-06-02 DIAGNOSIS — K5902 Outlet dysfunction constipation: Secondary | ICD-10-CM | POA: Diagnosis not present

## 2011-06-05 DIAGNOSIS — E119 Type 2 diabetes mellitus without complications: Secondary | ICD-10-CM | POA: Diagnosis not present

## 2011-06-05 DIAGNOSIS — E079 Disorder of thyroid, unspecified: Secondary | ICD-10-CM | POA: Diagnosis not present

## 2011-06-10 DIAGNOSIS — N186 End stage renal disease: Secondary | ICD-10-CM | POA: Diagnosis not present

## 2011-06-11 DIAGNOSIS — K5902 Outlet dysfunction constipation: Secondary | ICD-10-CM | POA: Diagnosis not present

## 2011-06-11 DIAGNOSIS — Z79899 Other long term (current) drug therapy: Secondary | ICD-10-CM | POA: Diagnosis not present

## 2011-06-11 DIAGNOSIS — Z944 Liver transplant status: Secondary | ICD-10-CM | POA: Diagnosis not present

## 2011-06-11 DIAGNOSIS — E119 Type 2 diabetes mellitus without complications: Secondary | ICD-10-CM | POA: Diagnosis not present

## 2011-06-11 DIAGNOSIS — E039 Hypothyroidism, unspecified: Secondary | ICD-10-CM | POA: Diagnosis not present

## 2011-06-12 DIAGNOSIS — N186 End stage renal disease: Secondary | ICD-10-CM | POA: Diagnosis not present

## 2011-06-12 DIAGNOSIS — Z992 Dependence on renal dialysis: Secondary | ICD-10-CM | POA: Diagnosis not present

## 2011-06-12 DIAGNOSIS — D509 Iron deficiency anemia, unspecified: Secondary | ICD-10-CM | POA: Diagnosis not present

## 2011-06-12 DIAGNOSIS — N2581 Secondary hyperparathyroidism of renal origin: Secondary | ICD-10-CM | POA: Diagnosis not present

## 2011-06-12 DIAGNOSIS — D631 Anemia in chronic kidney disease: Secondary | ICD-10-CM | POA: Diagnosis not present

## 2011-06-16 DIAGNOSIS — N186 End stage renal disease: Secondary | ICD-10-CM | POA: Diagnosis not present

## 2011-06-16 DIAGNOSIS — D631 Anemia in chronic kidney disease: Secondary | ICD-10-CM | POA: Diagnosis not present

## 2011-06-16 DIAGNOSIS — Z944 Liver transplant status: Secondary | ICD-10-CM | POA: Diagnosis not present

## 2011-06-17 DIAGNOSIS — N186 End stage renal disease: Secondary | ICD-10-CM | POA: Diagnosis not present

## 2011-06-19 DIAGNOSIS — N186 End stage renal disease: Secondary | ICD-10-CM | POA: Diagnosis not present

## 2011-06-21 DIAGNOSIS — N186 End stage renal disease: Secondary | ICD-10-CM | POA: Diagnosis not present

## 2011-06-25 DIAGNOSIS — K5902 Outlet dysfunction constipation: Secondary | ICD-10-CM | POA: Diagnosis not present

## 2011-06-27 DIAGNOSIS — B1910 Unspecified viral hepatitis B without hepatic coma: Secondary | ICD-10-CM | POA: Diagnosis not present

## 2011-06-27 DIAGNOSIS — Z79899 Other long term (current) drug therapy: Secondary | ICD-10-CM | POA: Diagnosis not present

## 2011-07-09 DIAGNOSIS — Z79899 Other long term (current) drug therapy: Secondary | ICD-10-CM | POA: Diagnosis not present

## 2011-07-09 DIAGNOSIS — B1910 Unspecified viral hepatitis B without hepatic coma: Secondary | ICD-10-CM | POA: Diagnosis not present

## 2011-07-11 DIAGNOSIS — N186 End stage renal disease: Secondary | ICD-10-CM | POA: Diagnosis not present

## 2011-07-12 DIAGNOSIS — D631 Anemia in chronic kidney disease: Secondary | ICD-10-CM | POA: Diagnosis not present

## 2011-07-12 DIAGNOSIS — D509 Iron deficiency anemia, unspecified: Secondary | ICD-10-CM | POA: Diagnosis not present

## 2011-07-12 DIAGNOSIS — N186 End stage renal disease: Secondary | ICD-10-CM | POA: Diagnosis not present

## 2011-07-12 DIAGNOSIS — N2581 Secondary hyperparathyroidism of renal origin: Secondary | ICD-10-CM | POA: Diagnosis not present

## 2011-07-12 DIAGNOSIS — N039 Chronic nephritic syndrome with unspecified morphologic changes: Secondary | ICD-10-CM | POA: Diagnosis not present

## 2011-07-12 DIAGNOSIS — E119 Type 2 diabetes mellitus without complications: Secondary | ICD-10-CM | POA: Diagnosis not present

## 2011-07-15 DIAGNOSIS — E119 Type 2 diabetes mellitus without complications: Secondary | ICD-10-CM | POA: Diagnosis not present

## 2011-07-15 DIAGNOSIS — N2581 Secondary hyperparathyroidism of renal origin: Secondary | ICD-10-CM | POA: Diagnosis not present

## 2011-07-15 DIAGNOSIS — D509 Iron deficiency anemia, unspecified: Secondary | ICD-10-CM | POA: Diagnosis not present

## 2011-07-15 DIAGNOSIS — D631 Anemia in chronic kidney disease: Secondary | ICD-10-CM | POA: Diagnosis not present

## 2011-07-15 DIAGNOSIS — N186 End stage renal disease: Secondary | ICD-10-CM | POA: Diagnosis not present

## 2011-07-17 DIAGNOSIS — D509 Iron deficiency anemia, unspecified: Secondary | ICD-10-CM | POA: Diagnosis not present

## 2011-07-17 DIAGNOSIS — N2581 Secondary hyperparathyroidism of renal origin: Secondary | ICD-10-CM | POA: Diagnosis not present

## 2011-07-17 DIAGNOSIS — N039 Chronic nephritic syndrome with unspecified morphologic changes: Secondary | ICD-10-CM | POA: Diagnosis not present

## 2011-07-17 DIAGNOSIS — N186 End stage renal disease: Secondary | ICD-10-CM | POA: Diagnosis not present

## 2011-07-17 DIAGNOSIS — E119 Type 2 diabetes mellitus without complications: Secondary | ICD-10-CM | POA: Diagnosis not present

## 2011-07-19 DIAGNOSIS — N186 End stage renal disease: Secondary | ICD-10-CM | POA: Diagnosis not present

## 2011-07-19 DIAGNOSIS — N2581 Secondary hyperparathyroidism of renal origin: Secondary | ICD-10-CM | POA: Diagnosis not present

## 2011-07-19 DIAGNOSIS — N039 Chronic nephritic syndrome with unspecified morphologic changes: Secondary | ICD-10-CM | POA: Diagnosis not present

## 2011-07-19 DIAGNOSIS — E119 Type 2 diabetes mellitus without complications: Secondary | ICD-10-CM | POA: Diagnosis not present

## 2011-07-19 DIAGNOSIS — D631 Anemia in chronic kidney disease: Secondary | ICD-10-CM | POA: Diagnosis not present

## 2011-07-19 DIAGNOSIS — D509 Iron deficiency anemia, unspecified: Secondary | ICD-10-CM | POA: Diagnosis not present

## 2011-07-22 DIAGNOSIS — E119 Type 2 diabetes mellitus without complications: Secondary | ICD-10-CM | POA: Diagnosis not present

## 2011-07-22 DIAGNOSIS — N2581 Secondary hyperparathyroidism of renal origin: Secondary | ICD-10-CM | POA: Diagnosis not present

## 2011-07-22 DIAGNOSIS — N186 End stage renal disease: Secondary | ICD-10-CM | POA: Diagnosis not present

## 2011-07-22 DIAGNOSIS — N039 Chronic nephritic syndrome with unspecified morphologic changes: Secondary | ICD-10-CM | POA: Diagnosis not present

## 2011-07-22 DIAGNOSIS — D509 Iron deficiency anemia, unspecified: Secondary | ICD-10-CM | POA: Diagnosis not present

## 2011-07-22 DIAGNOSIS — D631 Anemia in chronic kidney disease: Secondary | ICD-10-CM | POA: Diagnosis not present

## 2011-07-24 DIAGNOSIS — N2581 Secondary hyperparathyroidism of renal origin: Secondary | ICD-10-CM | POA: Diagnosis not present

## 2011-07-24 DIAGNOSIS — D509 Iron deficiency anemia, unspecified: Secondary | ICD-10-CM | POA: Diagnosis not present

## 2011-07-24 DIAGNOSIS — N039 Chronic nephritic syndrome with unspecified morphologic changes: Secondary | ICD-10-CM | POA: Diagnosis not present

## 2011-07-24 DIAGNOSIS — N186 End stage renal disease: Secondary | ICD-10-CM | POA: Diagnosis not present

## 2011-07-24 DIAGNOSIS — E119 Type 2 diabetes mellitus without complications: Secondary | ICD-10-CM | POA: Diagnosis not present

## 2011-07-26 DIAGNOSIS — N039 Chronic nephritic syndrome with unspecified morphologic changes: Secondary | ICD-10-CM | POA: Diagnosis not present

## 2011-07-26 DIAGNOSIS — N186 End stage renal disease: Secondary | ICD-10-CM | POA: Diagnosis not present

## 2011-07-26 DIAGNOSIS — D631 Anemia in chronic kidney disease: Secondary | ICD-10-CM | POA: Diagnosis not present

## 2011-07-26 DIAGNOSIS — D509 Iron deficiency anemia, unspecified: Secondary | ICD-10-CM | POA: Diagnosis not present

## 2011-07-26 DIAGNOSIS — N2581 Secondary hyperparathyroidism of renal origin: Secondary | ICD-10-CM | POA: Diagnosis not present

## 2011-07-26 DIAGNOSIS — E119 Type 2 diabetes mellitus without complications: Secondary | ICD-10-CM | POA: Diagnosis not present

## 2011-07-29 DIAGNOSIS — N2581 Secondary hyperparathyroidism of renal origin: Secondary | ICD-10-CM | POA: Diagnosis not present

## 2011-07-29 DIAGNOSIS — N186 End stage renal disease: Secondary | ICD-10-CM | POA: Diagnosis not present

## 2011-07-29 DIAGNOSIS — D509 Iron deficiency anemia, unspecified: Secondary | ICD-10-CM | POA: Diagnosis not present

## 2011-07-29 DIAGNOSIS — D631 Anemia in chronic kidney disease: Secondary | ICD-10-CM | POA: Diagnosis not present

## 2011-07-29 DIAGNOSIS — E119 Type 2 diabetes mellitus without complications: Secondary | ICD-10-CM | POA: Diagnosis not present

## 2011-07-31 DIAGNOSIS — N186 End stage renal disease: Secondary | ICD-10-CM | POA: Diagnosis not present

## 2011-07-31 DIAGNOSIS — D509 Iron deficiency anemia, unspecified: Secondary | ICD-10-CM | POA: Diagnosis not present

## 2011-07-31 DIAGNOSIS — E119 Type 2 diabetes mellitus without complications: Secondary | ICD-10-CM | POA: Diagnosis not present

## 2011-07-31 DIAGNOSIS — D631 Anemia in chronic kidney disease: Secondary | ICD-10-CM | POA: Diagnosis not present

## 2011-07-31 DIAGNOSIS — N2581 Secondary hyperparathyroidism of renal origin: Secondary | ICD-10-CM | POA: Diagnosis not present

## 2011-08-02 DIAGNOSIS — N2581 Secondary hyperparathyroidism of renal origin: Secondary | ICD-10-CM | POA: Diagnosis not present

## 2011-08-02 DIAGNOSIS — N186 End stage renal disease: Secondary | ICD-10-CM | POA: Diagnosis not present

## 2011-08-02 DIAGNOSIS — E119 Type 2 diabetes mellitus without complications: Secondary | ICD-10-CM | POA: Diagnosis not present

## 2011-08-02 DIAGNOSIS — D509 Iron deficiency anemia, unspecified: Secondary | ICD-10-CM | POA: Diagnosis not present

## 2011-08-02 DIAGNOSIS — D631 Anemia in chronic kidney disease: Secondary | ICD-10-CM | POA: Diagnosis not present

## 2011-08-05 DIAGNOSIS — D631 Anemia in chronic kidney disease: Secondary | ICD-10-CM | POA: Diagnosis not present

## 2011-08-05 DIAGNOSIS — D509 Iron deficiency anemia, unspecified: Secondary | ICD-10-CM | POA: Diagnosis not present

## 2011-08-05 DIAGNOSIS — N186 End stage renal disease: Secondary | ICD-10-CM | POA: Diagnosis not present

## 2011-08-05 DIAGNOSIS — N2581 Secondary hyperparathyroidism of renal origin: Secondary | ICD-10-CM | POA: Diagnosis not present

## 2011-08-05 DIAGNOSIS — E119 Type 2 diabetes mellitus without complications: Secondary | ICD-10-CM | POA: Diagnosis not present

## 2011-08-07 DIAGNOSIS — D631 Anemia in chronic kidney disease: Secondary | ICD-10-CM | POA: Diagnosis not present

## 2011-08-07 DIAGNOSIS — N186 End stage renal disease: Secondary | ICD-10-CM | POA: Diagnosis not present

## 2011-08-07 DIAGNOSIS — Z944 Liver transplant status: Secondary | ICD-10-CM | POA: Diagnosis not present

## 2011-08-07 DIAGNOSIS — E079 Disorder of thyroid, unspecified: Secondary | ICD-10-CM | POA: Diagnosis not present

## 2011-08-07 DIAGNOSIS — N2581 Secondary hyperparathyroidism of renal origin: Secondary | ICD-10-CM | POA: Diagnosis not present

## 2011-08-07 DIAGNOSIS — D509 Iron deficiency anemia, unspecified: Secondary | ICD-10-CM | POA: Diagnosis not present

## 2011-08-07 DIAGNOSIS — E119 Type 2 diabetes mellitus without complications: Secondary | ICD-10-CM | POA: Diagnosis not present

## 2011-08-09 DIAGNOSIS — N186 End stage renal disease: Secondary | ICD-10-CM | POA: Diagnosis not present

## 2011-08-09 DIAGNOSIS — D631 Anemia in chronic kidney disease: Secondary | ICD-10-CM | POA: Diagnosis not present

## 2011-08-09 DIAGNOSIS — E119 Type 2 diabetes mellitus without complications: Secondary | ICD-10-CM | POA: Diagnosis not present

## 2011-08-09 DIAGNOSIS — D509 Iron deficiency anemia, unspecified: Secondary | ICD-10-CM | POA: Diagnosis not present

## 2011-08-09 DIAGNOSIS — N039 Chronic nephritic syndrome with unspecified morphologic changes: Secondary | ICD-10-CM | POA: Diagnosis not present

## 2011-08-09 DIAGNOSIS — N2581 Secondary hyperparathyroidism of renal origin: Secondary | ICD-10-CM | POA: Diagnosis not present

## 2011-08-10 DIAGNOSIS — N186 End stage renal disease: Secondary | ICD-10-CM | POA: Diagnosis not present

## 2011-08-12 DIAGNOSIS — D509 Iron deficiency anemia, unspecified: Secondary | ICD-10-CM | POA: Diagnosis not present

## 2011-08-12 DIAGNOSIS — N2581 Secondary hyperparathyroidism of renal origin: Secondary | ICD-10-CM | POA: Diagnosis not present

## 2011-08-12 DIAGNOSIS — N186 End stage renal disease: Secondary | ICD-10-CM | POA: Diagnosis not present

## 2011-08-14 DIAGNOSIS — N186 End stage renal disease: Secondary | ICD-10-CM | POA: Diagnosis not present

## 2011-08-14 DIAGNOSIS — N2581 Secondary hyperparathyroidism of renal origin: Secondary | ICD-10-CM | POA: Diagnosis not present

## 2011-08-14 DIAGNOSIS — D509 Iron deficiency anemia, unspecified: Secondary | ICD-10-CM | POA: Diagnosis not present

## 2011-08-16 DIAGNOSIS — D509 Iron deficiency anemia, unspecified: Secondary | ICD-10-CM | POA: Diagnosis not present

## 2011-08-16 DIAGNOSIS — N186 End stage renal disease: Secondary | ICD-10-CM | POA: Diagnosis not present

## 2011-08-16 DIAGNOSIS — N2581 Secondary hyperparathyroidism of renal origin: Secondary | ICD-10-CM | POA: Diagnosis not present

## 2011-08-19 DIAGNOSIS — D509 Iron deficiency anemia, unspecified: Secondary | ICD-10-CM | POA: Diagnosis not present

## 2011-08-19 DIAGNOSIS — N186 End stage renal disease: Secondary | ICD-10-CM | POA: Diagnosis not present

## 2011-08-19 DIAGNOSIS — N2581 Secondary hyperparathyroidism of renal origin: Secondary | ICD-10-CM | POA: Diagnosis not present

## 2011-08-21 DIAGNOSIS — N2581 Secondary hyperparathyroidism of renal origin: Secondary | ICD-10-CM | POA: Diagnosis not present

## 2011-08-21 DIAGNOSIS — N186 End stage renal disease: Secondary | ICD-10-CM | POA: Diagnosis not present

## 2011-08-21 DIAGNOSIS — D509 Iron deficiency anemia, unspecified: Secondary | ICD-10-CM | POA: Diagnosis not present

## 2011-08-23 DIAGNOSIS — D509 Iron deficiency anemia, unspecified: Secondary | ICD-10-CM | POA: Diagnosis not present

## 2011-08-23 DIAGNOSIS — N2581 Secondary hyperparathyroidism of renal origin: Secondary | ICD-10-CM | POA: Diagnosis not present

## 2011-08-23 DIAGNOSIS — N186 End stage renal disease: Secondary | ICD-10-CM | POA: Diagnosis not present

## 2011-08-26 DIAGNOSIS — D509 Iron deficiency anemia, unspecified: Secondary | ICD-10-CM | POA: Diagnosis not present

## 2011-08-26 DIAGNOSIS — N2581 Secondary hyperparathyroidism of renal origin: Secondary | ICD-10-CM | POA: Diagnosis not present

## 2011-08-26 DIAGNOSIS — N186 End stage renal disease: Secondary | ICD-10-CM | POA: Diagnosis not present

## 2011-08-28 DIAGNOSIS — D509 Iron deficiency anemia, unspecified: Secondary | ICD-10-CM | POA: Diagnosis not present

## 2011-08-28 DIAGNOSIS — N186 End stage renal disease: Secondary | ICD-10-CM | POA: Diagnosis not present

## 2011-08-28 DIAGNOSIS — N2581 Secondary hyperparathyroidism of renal origin: Secondary | ICD-10-CM | POA: Diagnosis not present

## 2011-08-30 DIAGNOSIS — D509 Iron deficiency anemia, unspecified: Secondary | ICD-10-CM | POA: Diagnosis not present

## 2011-08-30 DIAGNOSIS — N186 End stage renal disease: Secondary | ICD-10-CM | POA: Diagnosis not present

## 2011-08-30 DIAGNOSIS — N2581 Secondary hyperparathyroidism of renal origin: Secondary | ICD-10-CM | POA: Diagnosis not present

## 2011-09-02 DIAGNOSIS — N186 End stage renal disease: Secondary | ICD-10-CM | POA: Diagnosis not present

## 2011-09-02 DIAGNOSIS — D509 Iron deficiency anemia, unspecified: Secondary | ICD-10-CM | POA: Diagnosis not present

## 2011-09-02 DIAGNOSIS — N2581 Secondary hyperparathyroidism of renal origin: Secondary | ICD-10-CM | POA: Diagnosis not present

## 2011-09-04 DIAGNOSIS — N2581 Secondary hyperparathyroidism of renal origin: Secondary | ICD-10-CM | POA: Diagnosis not present

## 2011-09-04 DIAGNOSIS — N186 End stage renal disease: Secondary | ICD-10-CM | POA: Diagnosis not present

## 2011-09-04 DIAGNOSIS — E079 Disorder of thyroid, unspecified: Secondary | ICD-10-CM | POA: Diagnosis not present

## 2011-09-04 DIAGNOSIS — D509 Iron deficiency anemia, unspecified: Secondary | ICD-10-CM | POA: Diagnosis not present

## 2011-09-04 DIAGNOSIS — E119 Type 2 diabetes mellitus without complications: Secondary | ICD-10-CM | POA: Diagnosis not present

## 2011-09-06 DIAGNOSIS — N2581 Secondary hyperparathyroidism of renal origin: Secondary | ICD-10-CM | POA: Diagnosis not present

## 2011-09-06 DIAGNOSIS — D509 Iron deficiency anemia, unspecified: Secondary | ICD-10-CM | POA: Diagnosis not present

## 2011-09-06 DIAGNOSIS — N186 End stage renal disease: Secondary | ICD-10-CM | POA: Diagnosis not present

## 2011-09-09 DIAGNOSIS — N2581 Secondary hyperparathyroidism of renal origin: Secondary | ICD-10-CM | POA: Diagnosis not present

## 2011-09-09 DIAGNOSIS — N186 End stage renal disease: Secondary | ICD-10-CM | POA: Diagnosis not present

## 2011-09-09 DIAGNOSIS — D509 Iron deficiency anemia, unspecified: Secondary | ICD-10-CM | POA: Diagnosis not present

## 2011-09-10 DIAGNOSIS — N186 End stage renal disease: Secondary | ICD-10-CM | POA: Diagnosis not present

## 2011-09-11 DIAGNOSIS — N2581 Secondary hyperparathyroidism of renal origin: Secondary | ICD-10-CM | POA: Diagnosis not present

## 2011-09-11 DIAGNOSIS — N186 End stage renal disease: Secondary | ICD-10-CM | POA: Diagnosis not present

## 2011-09-11 DIAGNOSIS — R6883 Chills (without fever): Secondary | ICD-10-CM | POA: Diagnosis not present

## 2011-09-11 DIAGNOSIS — D631 Anemia in chronic kidney disease: Secondary | ICD-10-CM | POA: Diagnosis not present

## 2011-09-11 DIAGNOSIS — D509 Iron deficiency anemia, unspecified: Secondary | ICD-10-CM | POA: Diagnosis not present

## 2011-09-13 DIAGNOSIS — D631 Anemia in chronic kidney disease: Secondary | ICD-10-CM | POA: Diagnosis not present

## 2011-09-13 DIAGNOSIS — R6883 Chills (without fever): Secondary | ICD-10-CM | POA: Diagnosis not present

## 2011-09-13 DIAGNOSIS — N186 End stage renal disease: Secondary | ICD-10-CM | POA: Diagnosis not present

## 2011-09-13 DIAGNOSIS — D509 Iron deficiency anemia, unspecified: Secondary | ICD-10-CM | POA: Diagnosis not present

## 2011-09-13 DIAGNOSIS — N2581 Secondary hyperparathyroidism of renal origin: Secondary | ICD-10-CM | POA: Diagnosis not present

## 2011-09-16 DIAGNOSIS — N2581 Secondary hyperparathyroidism of renal origin: Secondary | ICD-10-CM | POA: Diagnosis not present

## 2011-09-16 DIAGNOSIS — N039 Chronic nephritic syndrome with unspecified morphologic changes: Secondary | ICD-10-CM | POA: Diagnosis not present

## 2011-09-16 DIAGNOSIS — D509 Iron deficiency anemia, unspecified: Secondary | ICD-10-CM | POA: Diagnosis not present

## 2011-09-16 DIAGNOSIS — R6883 Chills (without fever): Secondary | ICD-10-CM | POA: Diagnosis not present

## 2011-09-16 DIAGNOSIS — N186 End stage renal disease: Secondary | ICD-10-CM | POA: Diagnosis not present

## 2011-09-18 DIAGNOSIS — N039 Chronic nephritic syndrome with unspecified morphologic changes: Secondary | ICD-10-CM | POA: Diagnosis not present

## 2011-09-18 DIAGNOSIS — N2581 Secondary hyperparathyroidism of renal origin: Secondary | ICD-10-CM | POA: Diagnosis not present

## 2011-09-18 DIAGNOSIS — D509 Iron deficiency anemia, unspecified: Secondary | ICD-10-CM | POA: Diagnosis not present

## 2011-09-18 DIAGNOSIS — N186 End stage renal disease: Secondary | ICD-10-CM | POA: Diagnosis not present

## 2011-09-18 DIAGNOSIS — R6883 Chills (without fever): Secondary | ICD-10-CM | POA: Diagnosis not present

## 2011-09-20 DIAGNOSIS — N2581 Secondary hyperparathyroidism of renal origin: Secondary | ICD-10-CM | POA: Diagnosis not present

## 2011-09-20 DIAGNOSIS — N186 End stage renal disease: Secondary | ICD-10-CM | POA: Diagnosis not present

## 2011-09-20 DIAGNOSIS — D631 Anemia in chronic kidney disease: Secondary | ICD-10-CM | POA: Diagnosis not present

## 2011-09-20 DIAGNOSIS — D509 Iron deficiency anemia, unspecified: Secondary | ICD-10-CM | POA: Diagnosis not present

## 2011-09-20 DIAGNOSIS — R6883 Chills (without fever): Secondary | ICD-10-CM | POA: Diagnosis not present

## 2011-09-23 DIAGNOSIS — N186 End stage renal disease: Secondary | ICD-10-CM | POA: Diagnosis not present

## 2011-09-23 DIAGNOSIS — N039 Chronic nephritic syndrome with unspecified morphologic changes: Secondary | ICD-10-CM | POA: Diagnosis not present

## 2011-09-23 DIAGNOSIS — R6883 Chills (without fever): Secondary | ICD-10-CM | POA: Diagnosis not present

## 2011-09-23 DIAGNOSIS — N2581 Secondary hyperparathyroidism of renal origin: Secondary | ICD-10-CM | POA: Diagnosis not present

## 2011-09-23 DIAGNOSIS — D509 Iron deficiency anemia, unspecified: Secondary | ICD-10-CM | POA: Diagnosis not present

## 2011-09-25 DIAGNOSIS — N2581 Secondary hyperparathyroidism of renal origin: Secondary | ICD-10-CM | POA: Diagnosis not present

## 2011-09-25 DIAGNOSIS — D509 Iron deficiency anemia, unspecified: Secondary | ICD-10-CM | POA: Diagnosis not present

## 2011-09-25 DIAGNOSIS — D631 Anemia in chronic kidney disease: Secondary | ICD-10-CM | POA: Diagnosis not present

## 2011-09-25 DIAGNOSIS — R6883 Chills (without fever): Secondary | ICD-10-CM | POA: Diagnosis not present

## 2011-09-25 DIAGNOSIS — N186 End stage renal disease: Secondary | ICD-10-CM | POA: Diagnosis not present

## 2011-09-27 DIAGNOSIS — N186 End stage renal disease: Secondary | ICD-10-CM | POA: Diagnosis not present

## 2011-09-27 DIAGNOSIS — D509 Iron deficiency anemia, unspecified: Secondary | ICD-10-CM | POA: Diagnosis not present

## 2011-09-27 DIAGNOSIS — N2581 Secondary hyperparathyroidism of renal origin: Secondary | ICD-10-CM | POA: Diagnosis not present

## 2011-09-27 DIAGNOSIS — N039 Chronic nephritic syndrome with unspecified morphologic changes: Secondary | ICD-10-CM | POA: Diagnosis not present

## 2011-09-27 DIAGNOSIS — R6883 Chills (without fever): Secondary | ICD-10-CM | POA: Diagnosis not present

## 2011-09-30 DIAGNOSIS — N186 End stage renal disease: Secondary | ICD-10-CM | POA: Diagnosis not present

## 2011-09-30 DIAGNOSIS — N2581 Secondary hyperparathyroidism of renal origin: Secondary | ICD-10-CM | POA: Diagnosis not present

## 2011-09-30 DIAGNOSIS — D509 Iron deficiency anemia, unspecified: Secondary | ICD-10-CM | POA: Diagnosis not present

## 2011-09-30 DIAGNOSIS — D631 Anemia in chronic kidney disease: Secondary | ICD-10-CM | POA: Diagnosis not present

## 2011-09-30 DIAGNOSIS — R6883 Chills (without fever): Secondary | ICD-10-CM | POA: Diagnosis not present

## 2011-10-02 DIAGNOSIS — D509 Iron deficiency anemia, unspecified: Secondary | ICD-10-CM | POA: Diagnosis not present

## 2011-10-02 DIAGNOSIS — L270 Generalized skin eruption due to drugs and medicaments taken internally: Secondary | ICD-10-CM | POA: Diagnosis not present

## 2011-10-02 DIAGNOSIS — I10 Essential (primary) hypertension: Secondary | ICD-10-CM | POA: Diagnosis not present

## 2011-10-02 DIAGNOSIS — L299 Pruritus, unspecified: Secondary | ICD-10-CM | POA: Diagnosis not present

## 2011-10-02 DIAGNOSIS — N186 End stage renal disease: Secondary | ICD-10-CM | POA: Diagnosis not present

## 2011-10-02 DIAGNOSIS — K219 Gastro-esophageal reflux disease without esophagitis: Secondary | ICD-10-CM | POA: Diagnosis not present

## 2011-10-02 DIAGNOSIS — N2581 Secondary hyperparathyroidism of renal origin: Secondary | ICD-10-CM | POA: Diagnosis not present

## 2011-10-02 DIAGNOSIS — R21 Rash and other nonspecific skin eruption: Secondary | ICD-10-CM | POA: Diagnosis not present

## 2011-10-02 DIAGNOSIS — T50995A Adverse effect of other drugs, medicaments and biological substances, initial encounter: Secondary | ICD-10-CM | POA: Diagnosis not present

## 2011-10-02 DIAGNOSIS — N039 Chronic nephritic syndrome with unspecified morphologic changes: Secondary | ICD-10-CM | POA: Diagnosis not present

## 2011-10-02 DIAGNOSIS — J449 Chronic obstructive pulmonary disease, unspecified: Secondary | ICD-10-CM | POA: Diagnosis not present

## 2011-10-02 DIAGNOSIS — R6883 Chills (without fever): Secondary | ICD-10-CM | POA: Diagnosis not present

## 2011-10-02 DIAGNOSIS — E119 Type 2 diabetes mellitus without complications: Secondary | ICD-10-CM | POA: Diagnosis not present

## 2011-10-03 DIAGNOSIS — Z79899 Other long term (current) drug therapy: Secondary | ICD-10-CM | POA: Diagnosis not present

## 2011-10-03 DIAGNOSIS — B1910 Unspecified viral hepatitis B without hepatic coma: Secondary | ICD-10-CM | POA: Diagnosis not present

## 2011-10-03 DIAGNOSIS — H0019 Chalazion unspecified eye, unspecified eyelid: Secondary | ICD-10-CM | POA: Diagnosis not present

## 2011-10-04 DIAGNOSIS — D631 Anemia in chronic kidney disease: Secondary | ICD-10-CM | POA: Diagnosis not present

## 2011-10-04 DIAGNOSIS — N186 End stage renal disease: Secondary | ICD-10-CM | POA: Diagnosis not present

## 2011-10-04 DIAGNOSIS — R6883 Chills (without fever): Secondary | ICD-10-CM | POA: Diagnosis not present

## 2011-10-04 DIAGNOSIS — D509 Iron deficiency anemia, unspecified: Secondary | ICD-10-CM | POA: Diagnosis not present

## 2011-10-04 DIAGNOSIS — N2581 Secondary hyperparathyroidism of renal origin: Secondary | ICD-10-CM | POA: Diagnosis not present

## 2011-10-07 DIAGNOSIS — N2581 Secondary hyperparathyroidism of renal origin: Secondary | ICD-10-CM | POA: Diagnosis not present

## 2011-10-07 DIAGNOSIS — N039 Chronic nephritic syndrome with unspecified morphologic changes: Secondary | ICD-10-CM | POA: Diagnosis not present

## 2011-10-07 DIAGNOSIS — N186 End stage renal disease: Secondary | ICD-10-CM | POA: Diagnosis not present

## 2011-10-07 DIAGNOSIS — D509 Iron deficiency anemia, unspecified: Secondary | ICD-10-CM | POA: Diagnosis not present

## 2011-10-07 DIAGNOSIS — R6883 Chills (without fever): Secondary | ICD-10-CM | POA: Diagnosis not present

## 2011-10-09 DIAGNOSIS — N186 End stage renal disease: Secondary | ICD-10-CM | POA: Diagnosis not present

## 2011-10-09 DIAGNOSIS — E079 Disorder of thyroid, unspecified: Secondary | ICD-10-CM | POA: Diagnosis not present

## 2011-10-09 DIAGNOSIS — D509 Iron deficiency anemia, unspecified: Secondary | ICD-10-CM | POA: Diagnosis not present

## 2011-10-09 DIAGNOSIS — N2581 Secondary hyperparathyroidism of renal origin: Secondary | ICD-10-CM | POA: Diagnosis not present

## 2011-10-09 DIAGNOSIS — N039 Chronic nephritic syndrome with unspecified morphologic changes: Secondary | ICD-10-CM | POA: Diagnosis not present

## 2011-10-09 DIAGNOSIS — R6883 Chills (without fever): Secondary | ICD-10-CM | POA: Diagnosis not present

## 2011-10-11 DIAGNOSIS — N039 Chronic nephritic syndrome with unspecified morphologic changes: Secondary | ICD-10-CM | POA: Diagnosis not present

## 2011-10-11 DIAGNOSIS — R6883 Chills (without fever): Secondary | ICD-10-CM | POA: Diagnosis not present

## 2011-10-11 DIAGNOSIS — N186 End stage renal disease: Secondary | ICD-10-CM | POA: Diagnosis not present

## 2011-10-11 DIAGNOSIS — N2581 Secondary hyperparathyroidism of renal origin: Secondary | ICD-10-CM | POA: Diagnosis not present

## 2011-10-11 DIAGNOSIS — D509 Iron deficiency anemia, unspecified: Secondary | ICD-10-CM | POA: Diagnosis not present

## 2011-10-14 DIAGNOSIS — N2581 Secondary hyperparathyroidism of renal origin: Secondary | ICD-10-CM | POA: Diagnosis not present

## 2011-10-14 DIAGNOSIS — D509 Iron deficiency anemia, unspecified: Secondary | ICD-10-CM | POA: Diagnosis not present

## 2011-10-14 DIAGNOSIS — E119 Type 2 diabetes mellitus without complications: Secondary | ICD-10-CM | POA: Diagnosis not present

## 2011-10-14 DIAGNOSIS — D631 Anemia in chronic kidney disease: Secondary | ICD-10-CM | POA: Diagnosis not present

## 2011-10-14 DIAGNOSIS — N186 End stage renal disease: Secondary | ICD-10-CM | POA: Diagnosis not present

## 2011-10-20 DIAGNOSIS — E119 Type 2 diabetes mellitus without complications: Secondary | ICD-10-CM | POA: Diagnosis not present

## 2011-10-20 DIAGNOSIS — K219 Gastro-esophageal reflux disease without esophagitis: Secondary | ICD-10-CM | POA: Diagnosis not present

## 2011-10-20 DIAGNOSIS — Z944 Liver transplant status: Secondary | ICD-10-CM | POA: Diagnosis not present

## 2011-10-20 DIAGNOSIS — Z452 Encounter for adjustment and management of vascular access device: Secondary | ICD-10-CM | POA: Diagnosis not present

## 2011-10-20 DIAGNOSIS — I10 Essential (primary) hypertension: Secondary | ICD-10-CM | POA: Diagnosis not present

## 2011-10-20 DIAGNOSIS — L298 Other pruritus: Secondary | ICD-10-CM | POA: Diagnosis not present

## 2011-10-20 DIAGNOSIS — Z79899 Other long term (current) drug therapy: Secondary | ICD-10-CM | POA: Diagnosis not present

## 2011-10-20 DIAGNOSIS — F17200 Nicotine dependence, unspecified, uncomplicated: Secondary | ICD-10-CM | POA: Diagnosis not present

## 2011-11-05 DIAGNOSIS — B86 Scabies: Secondary | ICD-10-CM | POA: Diagnosis not present

## 2011-11-06 DIAGNOSIS — E079 Disorder of thyroid, unspecified: Secondary | ICD-10-CM | POA: Diagnosis not present

## 2011-11-10 DIAGNOSIS — N186 End stage renal disease: Secondary | ICD-10-CM | POA: Diagnosis not present

## 2011-11-11 DIAGNOSIS — N186 End stage renal disease: Secondary | ICD-10-CM | POA: Diagnosis not present

## 2011-11-11 DIAGNOSIS — D509 Iron deficiency anemia, unspecified: Secondary | ICD-10-CM | POA: Diagnosis not present

## 2011-11-11 DIAGNOSIS — Z23 Encounter for immunization: Secondary | ICD-10-CM | POA: Diagnosis not present

## 2011-11-11 DIAGNOSIS — N2581 Secondary hyperparathyroidism of renal origin: Secondary | ICD-10-CM | POA: Diagnosis not present

## 2011-11-11 DIAGNOSIS — E8779 Other fluid overload: Secondary | ICD-10-CM | POA: Diagnosis not present

## 2011-11-27 DIAGNOSIS — E079 Disorder of thyroid, unspecified: Secondary | ICD-10-CM | POA: Diagnosis not present

## 2011-11-27 DIAGNOSIS — E119 Type 2 diabetes mellitus without complications: Secondary | ICD-10-CM | POA: Diagnosis not present

## 2011-11-28 DIAGNOSIS — Z79899 Other long term (current) drug therapy: Secondary | ICD-10-CM | POA: Diagnosis not present

## 2011-11-28 DIAGNOSIS — B1910 Unspecified viral hepatitis B without hepatic coma: Secondary | ICD-10-CM | POA: Diagnosis not present

## 2011-12-08 DIAGNOSIS — N185 Chronic kidney disease, stage 5: Secondary | ICD-10-CM | POA: Diagnosis not present

## 2011-12-08 DIAGNOSIS — E119 Type 2 diabetes mellitus without complications: Secondary | ICD-10-CM | POA: Diagnosis not present

## 2011-12-08 DIAGNOSIS — I120 Hypertensive chronic kidney disease with stage 5 chronic kidney disease or end stage renal disease: Secondary | ICD-10-CM | POA: Diagnosis not present

## 2011-12-08 DIAGNOSIS — Z944 Liver transplant status: Secondary | ICD-10-CM | POA: Diagnosis not present

## 2011-12-11 DIAGNOSIS — N186 End stage renal disease: Secondary | ICD-10-CM | POA: Diagnosis not present

## 2011-12-13 DIAGNOSIS — D631 Anemia in chronic kidney disease: Secondary | ICD-10-CM | POA: Diagnosis not present

## 2011-12-13 DIAGNOSIS — N2581 Secondary hyperparathyroidism of renal origin: Secondary | ICD-10-CM | POA: Diagnosis not present

## 2011-12-13 DIAGNOSIS — N186 End stage renal disease: Secondary | ICD-10-CM | POA: Diagnosis not present

## 2011-12-13 DIAGNOSIS — E8779 Other fluid overload: Secondary | ICD-10-CM | POA: Diagnosis not present

## 2011-12-22 ENCOUNTER — Encounter (HOSPITAL_COMMUNITY): Payer: Self-pay | Admitting: Cardiology

## 2011-12-22 ENCOUNTER — Observation Stay (HOSPITAL_COMMUNITY)
Admission: EM | Admit: 2011-12-22 | Discharge: 2011-12-25 | Disposition: A | Discharge status: Home / Self Care | Payer: Medicare Other | Source: Other Acute Inpatient Hospital | Attending: Family Medicine | Admitting: Family Medicine

## 2011-12-22 ENCOUNTER — Encounter: Payer: Self-pay | Admitting: Internal Medicine

## 2011-12-22 ENCOUNTER — Observation Stay (HOSPITAL_COMMUNITY): Payer: Medicare Other

## 2011-12-22 DIAGNOSIS — N189 Chronic kidney disease, unspecified: Secondary | ICD-10-CM | POA: Diagnosis present

## 2011-12-22 DIAGNOSIS — D539 Nutritional anemia, unspecified: Secondary | ICD-10-CM

## 2011-12-22 DIAGNOSIS — Z992 Dependence on renal dialysis: Secondary | ICD-10-CM | POA: Diagnosis not present

## 2011-12-22 DIAGNOSIS — E039 Hypothyroidism, unspecified: Secondary | ICD-10-CM

## 2011-12-22 DIAGNOSIS — B1710 Acute hepatitis C without hepatic coma: Secondary | ICD-10-CM

## 2011-12-22 DIAGNOSIS — K219 Gastro-esophageal reflux disease without esophagitis: Secondary | ICD-10-CM | POA: Diagnosis not present

## 2011-12-22 DIAGNOSIS — E119 Type 2 diabetes mellitus without complications: Secondary | ICD-10-CM | POA: Diagnosis not present

## 2011-12-22 DIAGNOSIS — J189 Pneumonia, unspecified organism: Secondary | ICD-10-CM

## 2011-12-22 DIAGNOSIS — D631 Anemia in chronic kidney disease: Secondary | ICD-10-CM | POA: Insufficient documentation

## 2011-12-22 DIAGNOSIS — E1122 Type 2 diabetes mellitus with diabetic chronic kidney disease: Secondary | ICD-10-CM | POA: Diagnosis present

## 2011-12-22 DIAGNOSIS — K7460 Unspecified cirrhosis of liver: Secondary | ICD-10-CM

## 2011-12-22 DIAGNOSIS — I129 Hypertensive chronic kidney disease with stage 1 through stage 4 chronic kidney disease, or unspecified chronic kidney disease: Secondary | ICD-10-CM | POA: Diagnosis not present

## 2011-12-22 DIAGNOSIS — E7800 Pure hypercholesterolemia, unspecified: Secondary | ICD-10-CM | POA: Diagnosis not present

## 2011-12-22 DIAGNOSIS — R5383 Other fatigue: Secondary | ICD-10-CM | POA: Diagnosis not present

## 2011-12-22 DIAGNOSIS — R0602 Shortness of breath: Secondary | ICD-10-CM | POA: Diagnosis not present

## 2011-12-22 DIAGNOSIS — R079 Chest pain, unspecified: Principal | ICD-10-CM | POA: Insufficient documentation

## 2011-12-22 DIAGNOSIS — I509 Heart failure, unspecified: Secondary | ICD-10-CM | POA: Diagnosis not present

## 2011-12-22 DIAGNOSIS — Z944 Liver transplant status: Secondary | ICD-10-CM | POA: Insufficient documentation

## 2011-12-22 DIAGNOSIS — E875 Hyperkalemia: Secondary | ICD-10-CM

## 2011-12-22 DIAGNOSIS — N186 End stage renal disease: Secondary | ICD-10-CM

## 2011-12-22 DIAGNOSIS — N039 Chronic nephritic syndrome with unspecified morphologic changes: Secondary | ICD-10-CM | POA: Diagnosis not present

## 2011-12-22 DIAGNOSIS — I10 Essential (primary) hypertension: Secondary | ICD-10-CM

## 2011-12-22 DIAGNOSIS — I252 Old myocardial infarction: Secondary | ICD-10-CM | POA: Diagnosis not present

## 2011-12-22 DIAGNOSIS — Z794 Long term (current) use of insulin: Secondary | ICD-10-CM | POA: Insufficient documentation

## 2011-12-22 DIAGNOSIS — J449 Chronic obstructive pulmonary disease, unspecified: Secondary | ICD-10-CM | POA: Diagnosis not present

## 2011-12-22 DIAGNOSIS — R5381 Other malaise: Secondary | ICD-10-CM | POA: Diagnosis not present

## 2011-12-22 DIAGNOSIS — J019 Acute sinusitis, unspecified: Secondary | ICD-10-CM

## 2011-12-22 DIAGNOSIS — I119 Hypertensive heart disease without heart failure: Secondary | ICD-10-CM | POA: Diagnosis present

## 2011-12-22 DIAGNOSIS — B1920 Unspecified viral hepatitis C without hepatic coma: Secondary | ICD-10-CM | POA: Insufficient documentation

## 2011-12-22 DIAGNOSIS — R9431 Abnormal electrocardiogram [ECG] [EKG]: Secondary | ICD-10-CM | POA: Diagnosis not present

## 2011-12-22 DIAGNOSIS — I120 Hypertensive chronic kidney disease with stage 5 chronic kidney disease or end stage renal disease: Secondary | ICD-10-CM | POA: Diagnosis not present

## 2011-12-22 LAB — LIPID PANEL
Cholesterol: 225 mg/dL — ABNORMAL HIGH (ref 0–200)
HDL: 97 mg/dL (ref >39)
LDL Cholesterol: 104 mg/dL — ABNORMAL HIGH (ref 0–99)
Total CHOL/HDL Ratio: 2.3 RATIO
Triglycerides: 121 mg/dL (ref <150)
VLDL: 24 mg/dL (ref 0–40)

## 2011-12-22 LAB — CBC
Hemoglobin: 11 g/dL — ABNORMAL LOW (ref 12.0–15.0)
MCH: 31.1 pg (ref 26.0–34.0)
RBC: 3.54 MIL/uL — ABNORMAL LOW (ref 3.87–5.11)
WBC: 5.7 10*3/uL (ref 4.0–10.5)

## 2011-12-22 LAB — RENAL FUNCTION PANEL
CO2: 22 mEq/L (ref 19–32)
Calcium: 8.8 mg/dL (ref 8.4–10.5)
Chloride: 92 mEq/L — ABNORMAL LOW (ref 96–112)
Creatinine, Ser: 8.71 mg/dL — ABNORMAL HIGH (ref 0.50–1.10)
Glucose, Bld: 113 mg/dL — ABNORMAL HIGH (ref 70–99)

## 2011-12-22 LAB — BASIC METABOLIC PANEL
CO2: 22 mEq/L (ref 19–32)
Chloride: 92 mEq/L — ABNORMAL LOW (ref 96–112)
Glucose, Bld: 114 mg/dL — ABNORMAL HIGH (ref 70–99)
Potassium: 4.7 mEq/L (ref 3.5–5.1)
Sodium: 133 mEq/L — ABNORMAL LOW (ref 135–145)

## 2011-12-22 LAB — PROTIME-INR: INR: 0.95 (ref 0.00–1.49)

## 2011-12-22 LAB — GLUCOSE, CAPILLARY: Glucose-Capillary: 110 mg/dL — ABNORMAL HIGH (ref 70–99)

## 2011-12-22 LAB — TROPONIN I: Troponin I: <0.3 ng/mL (ref <0.30)

## 2011-12-22 LAB — HEPARIN LEVEL (UNFRACTIONATED): Heparin Unfractionated: 0.47 IU/mL (ref 0.30–0.70)

## 2011-12-22 MED ORDER — ACETAMINOPHEN 325 MG PO TABS
650.0000 mg | ORAL_TABLET | ORAL | Status: DC | PRN
  Administered 2011-12-22 (×2): 650 mg via ORAL
  Filled 2011-12-22: qty 2

## 2011-12-22 MED ORDER — ASPIRIN 81 MG PO CHEW
324.0000 mg | CHEWABLE_TABLET | ORAL | Status: AC
  Administered 2011-12-22: 324 mg via ORAL
  Filled 2011-12-22: qty 4

## 2011-12-22 MED ORDER — LEVOTHYROXINE SODIUM 200 MCG PO TABS
200.0000 ug | ORAL_TABLET | Freq: Every day | ORAL | Status: DC
  Administered 2011-12-22 – 2011-12-25 (×4): 200 ug via ORAL
  Filled 2011-12-22 (×4): qty 1

## 2011-12-22 MED ORDER — TACROLIMUS 1 MG PO CAPS
5.0000 mg | ORAL_CAPSULE | Freq: Two times a day (BID) | ORAL | Status: DC
  Administered 2011-12-22 – 2011-12-25 (×6): 5 mg via ORAL
  Filled 2011-12-22 (×10): qty 5

## 2011-12-22 MED ORDER — INSULIN DETEMIR 100 UNIT/ML ~~LOC~~ SOLN
10.0000 [IU] | Freq: Every day | SUBCUTANEOUS | Status: DC
  Administered 2011-12-22 – 2011-12-24 (×3): 10 [IU] via SUBCUTANEOUS
  Filled 2011-12-22: qty 10

## 2011-12-22 MED ORDER — CALCIUM CARBONATE ANTACID 500 MG PO CHEW
1.0000 | CHEWABLE_TABLET | Freq: Every day | ORAL | Status: DC
  Administered 2011-12-22 – 2011-12-25 (×5): 200 mg via ORAL
  Filled 2011-12-22 (×4): qty 1

## 2011-12-22 MED ORDER — ASPIRIN EC 81 MG PO TBEC: 81.0000 mg | DELAYED_RELEASE_TABLET | Freq: Every day | ORAL | Status: DC

## 2011-12-22 MED ORDER — ATORVASTATIN CALCIUM 10 MG PO TABS
10.0000 mg | ORAL_TABLET | Freq: Every day | ORAL | Status: DC
  Administered 2011-12-23 – 2011-12-24 (×2): 10 mg via ORAL
  Filled 2011-12-22 (×4): qty 1

## 2011-12-22 MED ORDER — LISINOPRIL 20 MG PO TABS
20.0000 mg | ORAL_TABLET | Freq: Every day | ORAL | Status: DC
  Administered 2011-12-22: 20 mg via ORAL
  Filled 2011-12-22 (×2): qty 1

## 2011-12-22 MED ORDER — SODIUM CHLORIDE 0.9 % IJ SOLN: 3.0000 mL | INTRAMUSCULAR | Status: DC | PRN

## 2011-12-22 MED ORDER — GABAPENTIN 600 MG PO TABS
300.0000 mg | ORAL_TABLET | Freq: Every day | ORAL | Status: DC
  Filled 2011-12-22: qty 0.5

## 2011-12-22 MED ORDER — DULOXETINE HCL 60 MG PO CPEP
60.0000 mg | ORAL_CAPSULE | Freq: Every day | ORAL | Status: DC
  Administered 2011-12-22 – 2011-12-25 (×4): 60 mg via ORAL
  Filled 2011-12-22 (×4): qty 1

## 2011-12-22 MED ORDER — ASPIRIN 300 MG RE SUPP
300.0000 mg | RECTAL | Status: AC
  Filled 2011-12-22: qty 1

## 2011-12-22 MED ORDER — HEPARIN (PORCINE) IN NACL 100-0.45 UNIT/ML-% IJ SOLN
1100.0000 [IU]/h | INTRAMUSCULAR | Status: DC
  Administered 2011-12-22: 900 [IU]/h via INTRAVENOUS
  Administered 2011-12-22: 1000 [IU]/h via INTRAVENOUS
  Administered 2011-12-23 – 2011-12-24 (×2): 1100 [IU]/h via INTRAVENOUS
  Filled 2011-12-22 (×3): qty 250

## 2011-12-22 MED ORDER — ACETAMINOPHEN 325 MG PO TABS
ORAL_TABLET | ORAL | Status: AC
  Administered 2011-12-22: 650 mg via ORAL
  Filled 2011-12-22: qty 2

## 2011-12-22 MED ORDER — HYDRALAZINE HCL 20 MG/ML IJ SOLN
10.0000 mg | Freq: Four times a day (QID) | INTRAMUSCULAR | Status: DC | PRN
  Filled 2011-12-22: qty 0.5

## 2011-12-22 MED ORDER — ONDANSETRON HCL 4 MG/2ML IJ SOLN
4.0000 mg | Freq: Four times a day (QID) | INTRAMUSCULAR | Status: DC | PRN
  Administered 2011-12-22 – 2011-12-25 (×2): 4 mg via INTRAVENOUS
  Filled 2011-12-22 (×2): qty 2

## 2011-12-22 MED ORDER — INSULIN ASPART 100 UNIT/ML ~~LOC~~ SOLN
0.0000 [IU] | SUBCUTANEOUS | Status: DC
  Administered 2011-12-22: 3 [IU] via SUBCUTANEOUS
  Administered 2011-12-22 (×2): 1 [IU] via SUBCUTANEOUS
  Administered 2011-12-22 – 2011-12-23 (×2): 2 [IU] via SUBCUTANEOUS
  Administered 2011-12-24: 1 [IU] via SUBCUTANEOUS

## 2011-12-22 MED ORDER — METOPROLOL SUCCINATE ER 25 MG PO TB24
25.0000 mg | ORAL_TABLET | Freq: Two times a day (BID) | ORAL | Status: DC
  Filled 2011-12-22 (×2): qty 1

## 2011-12-22 MED ORDER — MORPHINE SULFATE 4 MG/ML IJ SOLN: 3.0000 mg | INTRAMUSCULAR | Status: DC | PRN

## 2011-12-22 MED ORDER — METOPROLOL TARTRATE 1 MG/ML IV SOLN
5.0000 mg | Freq: Once | INTRAVENOUS | Status: AC
  Administered 2011-12-22: 5 mg via INTRAVENOUS
  Filled 2011-12-22: qty 5

## 2011-12-22 MED ORDER — METOPROLOL SUCCINATE ER 50 MG PO TB24
50.0000 mg | ORAL_TABLET | Freq: Two times a day (BID) | ORAL | Status: DC
  Administered 2011-12-22 – 2011-12-25 (×6): 50 mg via ORAL
  Filled 2011-12-22 (×8): qty 1

## 2011-12-22 MED ORDER — MORPHINE SULFATE 2 MG/ML IJ SOLN: 2.0000 mg | INTRAMUSCULAR | Status: DC | PRN

## 2011-12-22 MED ORDER — PANTOPRAZOLE SODIUM 40 MG PO TBEC
40.0000 mg | DELAYED_RELEASE_TABLET | Freq: Every day | ORAL | Status: DC
  Administered 2011-12-22 – 2011-12-25 (×4): 40 mg via ORAL
  Filled 2011-12-22 (×4): qty 1

## 2011-12-22 MED ORDER — NITROGLYCERIN 2 % TD OINT
1.0000 [in_us] | TOPICAL_OINTMENT | Freq: Four times a day (QID) | TRANSDERMAL | Status: DC
  Administered 2011-12-22 – 2011-12-23 (×5): 1 [in_us] via TOPICAL
  Filled 2011-12-22: qty 30

## 2011-12-22 MED ORDER — SODIUM CHLORIDE 0.9 % IJ SOLN
3.0000 mL | Freq: Two times a day (BID) | INTRAMUSCULAR | Status: DC
  Administered 2011-12-24: 3 mL via INTRAVENOUS

## 2011-12-22 MED ORDER — HEPARIN BOLUS VIA INFUSION
4000.0000 [IU] | Freq: Once | INTRAVENOUS | Status: AC
  Administered 2011-12-22: 4000 [IU] via INTRAVENOUS
  Filled 2011-12-22: qty 4000

## 2011-12-22 MED ORDER — SODIUM CHLORIDE 0.9 % IV SOLN
250.0000 mL | INTRAVENOUS | Status: DC | PRN
  Administered 2011-12-22 – 2011-12-23 (×2): 250 mL via INTRAVENOUS

## 2011-12-22 MED ORDER — INSULIN GLARGINE 100 UNIT/ML ~~LOC~~ SOLN: 10.0000 [IU] | Freq: Every day | SUBCUTANEOUS | Status: DC

## 2011-12-22 MED ORDER — GABAPENTIN 300 MG PO CAPS
300.0000 mg | ORAL_CAPSULE | Freq: Every day | ORAL | Status: DC
  Administered 2011-12-22 – 2011-12-24 (×3): 300 mg via ORAL
  Filled 2011-12-22 (×4): qty 1

## 2011-12-22 MED ORDER — NITROGLYCERIN 0.4 MG SL SUBL: 0.4000 mg | SUBLINGUAL_TABLET | SUBLINGUAL | Status: DC | PRN

## 2011-12-22 MED ORDER — GABAPENTIN 600 MG PO TABS
600.0000 mg | ORAL_TABLET | Freq: Every day | ORAL | Status: DC
  Filled 2011-12-22: qty 1

## 2011-12-22 MED ORDER — PARICALCITOL 5 MCG/ML IV SOLN
3.0000 ug | INTRAVENOUS | Status: DC
  Administered 2011-12-22: 3 ug via INTRAVENOUS
  Filled 2011-12-22 (×3): qty 0.6

## 2011-12-22 MED ORDER — SUCRALFATE 1 G PO TABS
1.0000 g | ORAL_TABLET | Freq: Two times a day (BID) | ORAL | Status: DC
  Administered 2011-12-22: 1 g via ORAL
  Filled 2011-12-22 (×2): qty 1

## 2011-12-22 MED ORDER — ASPIRIN EC 81 MG PO TBEC
81.0000 mg | DELAYED_RELEASE_TABLET | Freq: Every day | ORAL | Status: DC
  Administered 2011-12-22 – 2011-12-25 (×4): 81 mg via ORAL
  Filled 2011-12-22 (×4): qty 1

## 2011-12-22 NOTE — Progress Notes (Signed)
Pt resting quietly at this time; no signs of nausea; will cont. To monitor.

## 2011-12-22 NOTE — H&P (Signed)
PCP:   [name], MD   Chief Complaint:  sscp  HPI: 56 yo female h/o esrd dialysis t/r/sat, s/p liver transplant 2011, hep c, htn, dm comes in from transfer from outside facility for sscp without radiation with associated mild sob no n/v.  No fevers.  No le edema.  Cp was relieved in ED on arrival about 100p earlier today and had ntg paste 1 inch put on about 130p now is having sscp again.  No prior h/o cad.  Stress testing was done years ago.  Compliant with meds and dialysis but her bp is uncontrolled currently.  Pt transferred here for cardiac evaluation she had 12 lead ekg there that showed nsr with tw inv in leads 1 and avl.  Initial trop was normal.  All other labs reviewed.    Review of Systems:  O/w neg  Past Medical History: Past Medical History  Diagnosis Date  . Liver disease     cirrhosis and this is why she had the transplant in 2011  . Hepatitis C   . Chronic back pain   . Diverticulosis   . CAD (coronary artery disease)   . Thyroid disease   . Obesity   . Leg pain   . Peripheral vascular disease hands and legs  . Anxiety   . Blood transfusion   . Asthma   . Shortness of breath   . Headache     last migraine 6+yrs ago  . Arthritis     back  . Chronic back pain   . GERD (gastroesophageal reflux disease)     takes Omeprazole daily  . Chronic constipation     takes MIralax and Colace daily  . Oligouria   . Anemia     takes Folic Acid daily  . Hypothyroidism     takes Synthroid daily  . Diabetes mellitus     Levemir 2units daily if > 150  . Depression     takes Cymbalta for "severe" depression  . Pneumonia   . Abdominal  pain, other specified site   . Neuromuscular disorder     carpal tunnel in right hand  . Hypertension     takes Metoprolol and Lisinopril daily, sees Dr Shary Decamp  . Chronic kidney disease     Tue, Thur, Sat, [facility], see Dr. Hyman Hopes   Past Surgical History  Procedure Date  . Liver transplant 10/25/2009    sees Dr Barnetta Hammersmith 1  every 6 months, saw last in Dec 2013. Odette Horns Coord [phone]  . Small intestine surgery 90's  . Thrombectomy   . Arteriovenous graft placement 10/03/10    Left forearm AVG  . Avgg removal 12/23/2010    Procedure: REMOVAL OF ARTERIOVENOUS GORETEX GRAFT (AVGG);  Surgeon: Sherren Kerns, MD;  Location: [facility] OR;  Service: Vascular;  Laterality: Left;  procedure started @1736 -1852  . Insertion of dialysis catheter 12/23/2010    Procedure: INSERTION OF DIALYSIS CATHETER;  Surgeon: Sherren Kerns, MD;  Location: [facility] OR;  Service: Vascular;  Laterality: Right;  Right Internal Jugular 28cm dialysis catheter insertion procedure time 1701-1720   . Cholecystectomy 1993  . Cystoscopy 15+yrs ago  . Back surgery 2010  . Av fistula placement 01/29/2011    Procedure: INSERTION OF ARTERIOVENOUS (AV) GORE-TEX GRAFT ARM;  Surgeon: Sherren Kerns, MD;  Location: [facility] OR;  Service: Vascular;  Laterality: Right;  . Tubal ligation   . Av fistula placement 03/10/2011    Procedure: INSERTION OF ARTERIOVENOUS (AV) GORE-TEX GRAFT THIGH;  Surgeon: Sherren Kerns, MD;  Location: [facility] OR;  Service: Vascular;  Laterality: Left;    Medications: Prior to Admission medications   Medication Sig Start Date End Date Taking? Authorizing Provider  albuterol (PROVENTIL,VENTOLIN) 90 MCG/ACT inhaler Inhale 2 puffs into the lungs every 4 (four) hours as needed. For shortness of breath    Historical Provider, MD  aspirin EC 81 MG tablet Take 81 mg by mouth daily.      Historical Provider, MD  calcium carbonate (TUMS - DOSED IN MG ELEMENTAL CALCIUM) 500 MG chewable tablet Chew 1 tablet by mouth daily.     Historical Provider, MD  docusate sodium (COLACE) 100 MG capsule Take 100 mg by mouth 2 (two) times daily.      Historical Provider, MD  DULoxetine (CYMBALTA) 60 MG capsule Take 60 mg by mouth daily.      Historical Provider, MD  FOLIC ACID PO Take 0.4 mg by mouth daily.      Historical Provider, MD  gabapentin  (NEURONTIN) 600 MG tablet Take 600 mg by mouth at bedtime.     Historical Provider, MD  insulin detemir (LEVEMIR) 100 UNIT/ML injection Inject 2 Units into the skin at bedtime as needed. Uses only if blood sugar levels are over 150     Historical Provider, MD  levothyroxine (SYNTHROID, LEVOTHROID) 200 MCG tablet Take 200 mcg by mouth daily.      Historical Provider, MD  lisinopril (PRINIVIL,ZESTRIL) 20 MG tablet Take 20 mg by mouth at bedtime.      Historical Provider, MD  metoprolol succinate (TOPROL-XL) 25 MG 24 hr tablet Take 25 mg by mouth 2 (two) times daily. Take once daily on dialysis days, Tuesday, Thursday and Saturday    Historical Provider, MD  omeprazole (PRILOSEC) 20 MG capsule Take 20 mg by mouth daily.      Historical Provider, MD  ondansetron (ZOFRAN-ODT) 4 MG disintegrating tablet Take 4 mg by mouth every 8 (eight) hours as needed. nausea    Historical Provider, MD  polyethylene glycol (MIRALAX / GLYCOLAX) packet Take 17 g by mouth 2 (two) times daily. For constipation    Historical Provider, MD  promethazine (PHENERGAN) 12.5 MG tablet Take 12.5 mg by mouth every 6 (six) hours as needed. For nausea     Historical Provider, MD  senna (SENOKOT) 8.6 MG tablet Take 1 tablet by mouth every evening.     Historical Provider, MD  sucralfate (CARAFATE) 1 G tablet Take 1 g by mouth 2 (two) times daily.      Historical Provider, MD  tacrolimus (PROGRAF) 1 MG capsule Take 5 mg by mouth 2 (two) times daily.     Historical Provider, MD  traMADol (ULTRAM) 50 MG tablet Take 50 mg by mouth every 6 (six) hours as needed. pain    Historical Provider, MD    Allergies:   Allergies  Allergen Reactions  . Acetaminophen Other (See Comments)    Liver problems  . Codeine Itching    Social History:  reports that she has been smoking Cigarettes.  She has a 7.5 pack-year smoking history. She has never used smokeless tobacco. She reports that she does not drink alcohol or use illicit drugs.  Family  History: Family History  Problem Relation Age of Onset  . Cancer Mother   . Diabetes Mother   . Hypertension Mother   . Stroke Mother   . Cancer Father   . Anesthesia problems Neg Hx   . Hypotension Neg Hx   .  Malignant hyperthermia Neg Hx   . Pseudochol deficiency Neg Hx     Physical Exam: Filed Vitals:   12/22/11 0334  BP: 184/80  Pulse: 89  Temp: 97.6 F (36.4 C)  TempSrc: Oral  Resp: 18  Height: 5\' 3"  (1.6 m)  Weight: 71.396 kg (157 lb 6.4 oz)  SpO2: 100%   General appearance: alert, cooperative and no distress Neck: no JVD and supple, symmetrical, trachea midline Lungs: clear to auscultation bilaterally Heart: regular rate and rhythm, S1, S2 normal, no murmur, click, rub or gallop  Reproducible cp with palp she states that is the pain she is having, no rashes, no bruising no recent trauma Abdomen: soft, non-tender; bowel sounds normal; no masses,  no organomegaly Extremities: extremities normal, atraumatic, no cyanosis or edema Pulses: 2+ and symmetric Skin: Skin color, texture, turgor normal. No rashes or lesions Neurologic: Grossly normal    Labs on Admission:  Reviewed Repeat 12 lead ekg no change from prior ekg at outside facility taken over 12 hours ago nsr with t wave inversion in leads 1 and avl only  Radiological Exams on Admission: No results found.  Assessment/Plan 56 yo female with atypical cp and ekg changes  Principal Problem:  *Chest pain Active Problems:  HEPATITIS C  DIABETES MELLITUS, TYPE II  ANEMIA, CHRONIC  HYPERTENSION  End stage renal disease dialysis t/r/sat  S/P liver transplant  EKG abnormalities  Place on acs pathway.  Full dose heparin.  Replace her ntg paste, give iv lopressor.  Ssi.  Ck echo in am.  Asa.  Pain is reproducible but do not have an old ekg to compare this one with.  Cont prograf for liver transplant.  Morphine prn.  If here til tues will need to let nephro know for her dialysis.  Will need further complete w/u  with stress testing.  obs.   Renold Kozar A 12/22/2011, 4:14 AM

## 2011-12-22 NOTE — Progress Notes (Signed)
Pt to HD at this time

## 2011-12-22 NOTE — Progress Notes (Signed)
[facility] PROGRESS NOTE  [name] ZOX:[id number] DOB: 21-Mar-1955 DOA: 12/22/2011 PCP: [name], MD  Assessment/Plan: Principal Problem:  *Chest pain Active Problems:  HEPATITIS C  DIABETES MELLITUS, TYPE II  ANEMIA, CHRONIC  HYPERTENSION  End stage renal disease dialysis t/r/sat  S/P liver transplant  EKG abnormalities    1. Chest pain: Patient presented with recurrent retrosternal chest pain since 12/20/11. 12-lead EKG showed old anterior Q waves, ant T inversion in leads 1 and AVL. Unfortunately, no old EKGs are available for comparison. Cardiac enzymes are pending, but patient does have significant risk factors for CAD. Currently on iv Heparin, Nitro-paste, beta-blocker and low dose ASA. Will consult cardiology [facility]. 2. HTN: Patient appears to have difficult-to-control HTN, despite pre-admission ACE-i and beta-blocker. Will adjust medications as indicated, to optimize control.  3. DM-2: This is insulin-dependent type 2, and appears uncontrolled, based on random blood glucose. Managing with diet, Levemir and SSI. Will check HBA1C. 4. ESRD: On HD, T-T-S. Have consulted renal team, to reinstate dialysis.  5. Hypothyroidism: Continued on thyroxine replacement therapy.  6. GERD: Asymptomatic, although may be the cul;prit for chest pain,if CAD ruled out. Managing with PPI.   Code Status: Full Code.  Family Communication:  Disposition Plan: Per cardiology.    Brief narrative: 56 yo female with history of tobacco abuse, ESRD on HD T-T-S dialysis, hepatitis C, s/p liver transplant 2011, HTN, DM, CAD, diverticulosis, chronic back pain, hypothyroidism, GERD, PVD, obesity, asthma, anxiety, transfered from outside facility for sub-sternal chest pain without radiation, with associated mild SOB. No fevers, no LE edema. Initially relieved in ED, but recurrent. Stress testing was done years ago. Compliant with meds and dialysis but BP is uncontrolled currently. Admitted for further  evaluation and management.    Consultants:  [facility] Cardiology.  Nephrology.   Procedures:  N/A.  Antibiotics:  N/A  HPI/Subjective: Still has chest discomfort.  Objective: Vital signs in last 24 hours: Temp:  [97.6 F (36.4 C)] 97.6 F (36.4 C) (11/11 0334) Pulse Rate:  [89] 89  (11/11 0334) Resp:  [18] 18  (11/11 0334) BP: (184)/(80) 184/80 mmHg (11/11 0334) SpO2:  [100 %] 100 % (11/11 0334) Weight:  [71.396 kg (157 lb 6.4 oz)] 71.396 kg (157 lb 6.4 oz) (11/11 0334) Weight change:  Last BM Date: 12/21/11  Intake/Output from previous day:       Physical Exam: General: Comfortable, alert, communicative, fully oriented, not short of breath at rest.  HEENT:  Mild clinical pallor, no jaundice, no conjunctival injection or discharge. NECK:  Supple, JVP not seen, no carotid bruits, no palpable lymphadenopathy, no palpable goiter. CHEST:  Clinically clear to auscultation, no wheezes, no crackles. HEART:  Sounds 1 and 2 heard, normal, regular, no murmurs. ABDOMEN:  Full, soft, old surgical scars, non-tender, no palpable organomegaly, no palpable masses, normal bowel sounds. GENITALIA:  Not examined. LOWER EXTREMITIES:  No pitting edema, palpable peripheral pulses. Has left upper thigh functioning AV fistula.  MUSCULOSKELETAL SYSTEM:  Unremarkable.  CENTRAL NERVOUS SYSTEM:  No focal neurologic deficit on gross examination.  Lab Results: No results found for this basename: WBC:2,HGB:2,HCT:2,PLT:2 in the last 72 hours No results found for this basename: NA:2,K:2,CL:2,CO2:2,GLUCOSE:2,BUN:2,CREATININE:2,CALCIUM:2 in the last 72 hours No results found for this or any previous visit (from the past 240 hour(s)).   Studies/Results: No results found.  Medications: Scheduled Meds:   . [COMPLETED] aspirin  324 mg Oral NOW   Or  . [COMPLETED] aspirin  300 mg Rectal NOW  . aspirin  EC  81 mg Oral Daily  . aspirin EC  81 mg Oral Daily  . calcium carbonate  1 tablet Oral Daily    . DULoxetine  60 mg Oral Daily  . gabapentin  600 mg Oral QHS  . [COMPLETED] heparin  4,000 Units Intravenous Once  . insulin aspart  0-9 Units Subcutaneous Q4H  . insulin glargine  10 Units Subcutaneous QHS  . levothyroxine  200 mcg Oral Daily  . lisinopril  20 mg Oral QHS  . [COMPLETED] metoprolol  5 mg Intravenous Once  . metoprolol succinate  25 mg Oral BID  . nitroGLYCERIN  1 inch Topical Q6H  . sodium chloride  3 mL Intravenous Q12H  . sucralfate  1 g Oral BID  . tacrolimus  5 mg Oral BID   Continuous Infusions:   . heparin 900 Units/hr (12/22/11 0540)   PRN Meds:.sodium chloride, acetaminophen, morphine injection, nitroGLYCERIN, ondansetron (ZOFRAN) IV, sodium chloride    LOS: 0 days   Jaleya Pebley,CHRISTOPHER  [facility] Pager [phone]. If 8PM-8AM, please contact night-coverage at [email or web address], password [facility] 12/22/2011, 7:23 AM  LOS: 0 days

## 2011-12-22 NOTE — Procedures (Signed)
[facility]  On HD via L thigh avg BP--123/72  Goal--5 L Hgb--11   K+--4.6 For "stress test" tomorrow.

## 2011-12-22 NOTE — Consult Note (Signed)
Pt. Seen and examined. Agree with the NP/PA-C note as written.  Patient seen at dialysis. She presented with chest pain which was 10/10, however, BP was elevated. Could be related to coronary ischemia. Multiple cardiac risk factors including ESRD on HD. Troponin has been negative x 2. Cholesterol is not quite at goal LDL < 100.  Will plan lexiscan NST tomorrow for coronary evaluation. Further treatment based on findings.    Will follow with you. Thanks for the consult.  Chrystie Nose, MD, [facility] Attending Cardiologist [facility]

## 2011-12-22 NOTE — Progress Notes (Signed)
Pt given 4mg IV Zofran at this time for nausea; will cont. To monitor. 

## 2011-12-22 NOTE — Consult Note (Signed)
Reason for Consult: chest pain  Referring Physician: Dr. Inez Pilgrim is an 56 y.o. female.   Cardiologist: New  Chief Complaint:  Admitted early am with SSCP  HPI: 56 yo female h/o esrd dialysis t/r/sat, s/p liver transplant 2011, hep c, htn, dm comes in from transfer from outside facility for sscp without radiation with associated mild sob no n/v. No fevers. No le edema. Cp was relieved in ED on arrival about 100p earlier and had ntg paste 1 inch put on about 130p now is having sscp again. No prior h/o cad. Stress testing was done years ago. Compliant with meds and dialysis but her bp is uncontrolled currently. Pt transferred here for cardiac evaluation she had 12 lead ekg there that showed nsr with tw inv in leads 1 and avl. Initial trop was normal.  And continue to be normal.    Currently on exam without chest pain.  She stated the chest pain was significant pressure and pain increased with a deep breath.  Also associated with dizziness.  She also relates that for the last month she has had DOE.  Sometimes with just walking into dialysis.  Other hx below, but does include, DM, HTN, + tobacco.    Past Medical History  Diagnosis Date  . Liver disease     cirrhosis and this is why she had the transplant in 2011  . Hepatitis C   . Chronic back pain   . Diverticulosis   . CAD (coronary artery disease)   . Thyroid disease   . Obesity   . Leg pain   . Peripheral vascular disease hands and legs  . Anxiety   . Blood transfusion   . Asthma   . Shortness of breath   . Headache     last migraine 6+yrs ago  . Arthritis     back  . Chronic back pain   . GERD (gastroesophageal reflux disease)     takes Omeprazole daily  . Chronic constipation     takes MIralax and Colace daily  . Oligouria   . Anemia     takes Folic Acid daily  . Hypothyroidism     takes Synthroid daily  . Diabetes mellitus     Levemir 2units daily if > 150  . Depression     takes Cymbalta for "severe"  depression  . Pneumonia   . Abdominal  pain, other specified site   . Neuromuscular disorder     carpal tunnel in right hand  . Hypertension     takes Metoprolol and Lisinopril daily, sees Dr Shary Decamp  . Chronic kidney disease     Tue, Thur, Sat, [facility], see Dr. Hyman Hopes    Past Surgical History  Procedure Date  . 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 [facility] Coord [phone]  . Small intestine surgery 90's  . Thrombectomy   . Arteriovenous graft placement 10/03/10    Left forearm AVG  . Avgg removal 12/23/2010    Procedure: REMOVAL OF ARTERIOVENOUS GORETEX GRAFT (AVGG);  Surgeon: Sherren Kerns, MD;  Location: [facility] OR;  Service: Vascular;  Laterality: Left;  procedure started @1736 -8295  . Insertion of dialysis catheter 12/23/2010    Procedure: INSERTION OF DIALYSIS CATHETER;  Surgeon: Sherren Kerns, MD;  Location: [facility] OR;  Service: Vascular;  Laterality: Right;  Right Internal Jugular 28cm dialysis catheter insertion procedure time 1701-1720   . Cholecystectomy  1993  . Cystoscopy 15+yrs ago  . Back surgery 2010  . Av fistula placement 01/29/2011    Procedure: INSERTION OF ARTERIOVENOUS (AV) GORE-TEX GRAFT ARM;  Surgeon: Sherren Kerns, MD;  Location: [facility] OR;  Service: Vascular;  Laterality: Right;  . Tubal ligation   . Av fistula placement 03/10/2011    Procedure: INSERTION OF ARTERIOVENOUS (AV) GORE-TEX GRAFT THIGH;  Surgeon: Sherren Kerns, MD;  Location: [facility] OR;  Service: Vascular;  Laterality: Left;    Family History  Problem Relation Age of Onset  . Cancer Mother   . Diabetes Mother   . Hypertension Mother   . Stroke Mother   . Cancer Father   . Anesthesia problems Neg Hx   . Hypotension Neg Hx   . Malignant hyperthermia Neg Hx   . Pseudochol deficiency Neg Hx    Social History:  reports that she has been smoking Cigarettes.  She has a 7.5 pack-year smoking history. She has never used smokeless  tobacco. She reports that she does not drink alcohol or use illicit drugs. Divorced and 2 children.  Allergies:  Allergies  Allergen Reactions  . Acetaminophen Other (See Comments)    Liver problems  . Codeine Itching    Medications Prior to Admission  Medication Sig Dispense Refill  . albuterol (PROVENTIL,VENTOLIN) 90 MCG/ACT inhaler Inhale 2 puffs into the lungs every 4 (four) hours as needed. For shortness of breath      . aspirin EC 81 MG tablet Take 81 mg by mouth daily.        . calcium carbonate (TUMS - DOSED IN MG ELEMENTAL CALCIUM) 500 MG chewable tablet Chew 1 tablet by mouth daily.       [name] docusate sodium (COLACE) 100 MG capsule Take 100 mg by mouth 2 (two) times daily.        . DULoxetine (CYMBALTA) 60 MG capsule Take 60 mg by mouth daily.        [name] FOLIC ACID PO Take 0.4 mg by mouth daily.        [name] gabapentin (NEURONTIN) 600 MG tablet Take 600 mg by mouth at bedtime.       . insulin detemir (LEVEMIR) 100 UNIT/ML injection Inject 2 Units into the skin at bedtime as needed. Uses only if blood sugar levels are over 150       . levothyroxine (SYNTHROID, LEVOTHROID) 175 MCG tablet Take 175 mcg by mouth daily.      . metoprolol tartrate (LOPRESSOR) 25 MG tablet Take 25 mg by mouth See admin instructions. On dialysis days- Tues, Thurs, and Sat      . omeprazole (PRILOSEC) 20 MG capsule Take 20 mg by mouth daily.        . ondansetron (ZOFRAN-ODT) 4 MG disintegrating tablet Take 4 mg by mouth every 8 (eight) hours as needed. nausea      . polyethylene glycol (MIRALAX / GLYCOLAX) packet Take 17 g by mouth 2 (two) times daily. For constipation      . promethazine (PHENERGAN) 12.5 MG tablet Take 12.5 mg by mouth every 6 (six) hours as needed. For nausea       . senna (SENOKOT) 8.6 MG tablet Take 1 tablet by mouth every evening.       . sucralfate (CARAFATE) 1 G tablet Take 1 g by mouth 2 (two) times daily.        . tacrolimus (PROGRAF) 1 MG capsule Take 5 mg by mouth 2 (two) times daily.        [name]  traMADol (ULTRAM) 50 MG tablet Take 50 mg by mouth every 6 (six) hours as needed. pain        Results for orders placed during the hospital encounter of 12/22/11 (from the past 48 hour(s))  GLUCOSE, CAPILLARY     Status: Abnormal   Collection Time   12/22/11  3:57 AM      Component Value Range Comment   Glucose-Capillary 228 (*) 70 - 99 mg/dL   GLUCOSE, CAPILLARY     Status: Abnormal   Collection Time   12/22/11  8:24 AM      Component Value Range Comment   Glucose-Capillary 110 (*) 70 - 99 mg/dL   CBC     Status: Abnormal   Collection Time   12/22/11 10:30 AM      Component Value Range Comment   WBC 5.7  4.0 - 10.5 K/uL    RBC 3.54 (*) 3.87 - 5.11 MIL/uL    Hemoglobin 11.0 (*) 12.0 - 15.0 g/dL    HCT 16.1 (*) 09.6 - 46.0 %    MCV 91.5  78.0 - 100.0 fL    MCH 31.1  26.0 - 34.0 pg    MCHC 34.0  30.0 - 36.0 g/dL    RDW 04.5  40.9 - 81.1 %    Platelets 111 (*) 150 - 400 K/uL CONSISTENT WITH PREVIOUS RESULT  PROTIME-INR     Status: Normal   Collection Time   12/22/11 10:30 AM      Component Value Range Comment   Prothrombin Time 12.6  11.6 - 15.2 seconds    INR 0.95  0.00 - 1.49    No results found.  ROS: General:no colds or fevers, no weight changes Skin:no rashes or ulcers HEENT:no blurred vision, no congestion CV:see HPI PUL:see HPI GI:no diarrhea constipation or melena, no indigestion GU:no longer voids--dialysis- graft in Lt thigh MS:no joint pain, no claudication Neuro:no syncope except near syncope with hypotension occ. At dialysis, no lightheadedness Endo:stable diabetes, no thyroid disease   Blood pressure 140/68, pulse 75, temperature 97.6 F (36.4 C), temperature source Oral, resp. rate 18, height 5\' 3"  (1.6 m), weight 71.396 kg (157 lb 6.4 oz), SpO2 100.00%. PE:  General:alert and oriented, no current chest pain Skin:warm and dry, brisk capillary refill HEENT:normocephalic, sclera clear Neck:supple, non JVD, no bruits Heart:S1S2 RRR no obvious  murmur, gallup rub or click Lungs:+ cough, no rales, no wheezes BJY:NWGN non tender + BS Ext:+ thrill and bruit with Lt thigh AV graft, no edema Neuro:alert and oriented X 3, MAE, follows commands   Assessment/Plan Principal Problem:  *Chest pain Active Problems:  HEPATITIS C  DIABETES MELLITUS, TYPE II  ANEMIA, CHRONIC  HYPERTENSION  End stage renal disease dialysis t/r/sat  S/P liver transplant  EKG abnormalities  PLAN: Has multiple risk factors for CAD.  Prob nuc study tomorrow, though MD to see and add recommendations. She is on ASA, Heparin, ACE, BB, would add statin- her LDL is 104 though HDL is 97, trig 121.   Sunshine Mackowski R 12/22/2011, 11:40 AM

## 2011-12-22 NOTE — Progress Notes (Signed)
ANTICOAGULATION CONSULT NOTE - Follow-Up Consult  Pharmacy Consult for heparin Indication: chest pain/ACS  Allergies  Allergen Reactions  . Acetaminophen Other (See Comments)    Liver problems  . Codeine Itching   Labs: From [facility]: WBC 5.9, Hgb/Hct 11.4/33.7, Plt 127, INR 1, SCr 8.1, trop 0.04  Medical History: Past Medical History  Diagnosis Date  . Liver disease     cirrhosis and this is why she had the transplant in 2011  . Hepatitis C   . Chronic back pain   . Diverticulosis   . CAD (coronary artery disease)   . Thyroid disease   . Obesity   . Leg pain   . Peripheral vascular disease hands and legs  . Anxiety   . Blood transfusion   . Asthma   . Shortness of breath   . Headache     last migraine 6+yrs ago  . Arthritis     back  . Chronic back pain   . GERD (gastroesophageal reflux disease)     takes Omeprazole daily  . Chronic constipation     takes MIralax and Colace daily  . Oligouria   . Anemia     takes Folic Acid daily  . Hypothyroidism     takes Synthroid daily  . Diabetes mellitus     Levemir 2units daily if > 150  . Depression     takes Cymbalta for "severe" depression  . Pneumonia   . Abdominal  pain, other specified site   . Neuromuscular disorder     carpal tunnel in right hand  . Hypertension     takes Metoprolol and Lisinopril daily, sees Dr Shary Decamp  . Chronic kidney disease     Tue, Thur, Sat, [facility], see Dr. Hyman Hopes    Assessment: 56yo female on HD TTS c/o CP, transferred to [facility] from [facility] for cardiac evaluation, to begin heparin.  HL therapeutic at 0.47 after a bolus of 4000  Units  Goal of Therapy:  Heparin level 0.3-0.7 units/ml Monitor platelets by anticoagulation protocol: Yes   Plan:  1) Increase heparin to 1000 units / hr 2) Follow up AM heparin level  Thank you. Okey Regal, PharmD [phone]  12/22/2011,4:16 PM

## 2011-12-22 NOTE — Progress Notes (Signed)
ANTICOAGULATION CONSULT NOTE - Initial Consult  Pharmacy Consult for heparin Indication: chest pain/ACS  Allergies  Allergen Reactions  . Acetaminophen Other (See Comments)    Liver problems  . Codeine Itching    Patient Measurements: Height: 5\' 3"  (160 cm) Weight: 157 lb 6.4 oz (71.396 kg) IBW/kg (Calculated) : 52.4  Heparin Dosing Weight: 68kg  Vital Signs: Temp: 97.6 F (36.4 C) (11/11 0334) Temp src: Oral (11/11 0334) BP: 184/80 mmHg (11/11 0334) Pulse Rate: 89  (11/11 0334)  Labs: From [facility]: WBC 5.9, Hgb/Hct 11.4/33.7, Plt 127, INR 1, SCr 8.1, trop 0.04  Medical History: Past Medical History  Diagnosis Date  . Liver disease     cirrhosis and this is why she had the transplant in 2011  . Hepatitis C   . Chronic back pain   . Diverticulosis   . CAD (coronary artery disease)   . Thyroid disease   . Obesity   . Leg pain   . Peripheral vascular disease hands and legs  . Anxiety   . Blood transfusion   . Asthma   . Shortness of breath   . Headache     last migraine 6+yrs ago  . Arthritis     back  . Chronic back pain   . GERD (gastroesophageal reflux disease)     takes Omeprazole daily  . Chronic constipation     takes MIralax and Colace daily  . Oligouria   . Anemia     takes Folic Acid daily  . Hypothyroidism     takes Synthroid daily  . Diabetes mellitus     Levemir 2units daily if > 150  . Depression     takes Cymbalta for "severe" depression  . Pneumonia   . Abdominal  pain, other specified site   . Neuromuscular disorder     carpal tunnel in right hand  . Hypertension     takes Metoprolol and Lisinopril daily, sees Dr Shary Decamp  . Chronic kidney disease     Tue, Thur, Sat, [facility], see Dr. Hyman Hopes    Medications:  Prescriptions prior to admission  Medication Sig Dispense Refill  . albuterol (PROVENTIL,VENTOLIN) 90 MCG/ACT inhaler Inhale 2 puffs into the lungs every 4 (four) hours as needed. For shortness of breath        . aspirin EC 81 MG tablet Take 81 mg by mouth daily.        . calcium carbonate (TUMS - DOSED IN MG ELEMENTAL CALCIUM) 500 MG chewable tablet Chew 1 tablet by mouth daily.       [name] docusate sodium (COLACE) 100 MG capsule Take 100 mg by mouth 2 (two) times daily.        . DULoxetine (CYMBALTA) 60 MG capsule Take 60 mg by mouth daily.        [name] FOLIC ACID PO Take 0.4 mg by mouth daily.        [name] gabapentin (NEURONTIN) 600 MG tablet Take 600 mg by mouth at bedtime.       . insulin detemir (LEVEMIR) 100 UNIT/ML injection Inject 2 Units into the skin at bedtime as needed. Uses only if blood sugar levels are over 150       . levothyroxine (SYNTHROID, LEVOTHROID) 200 MCG tablet Take 200 mcg by mouth daily.        [name] lisinopril (PRINIVIL,ZESTRIL) 20 MG tablet Take 20 mg by mouth at bedtime.        . metoprolol succinate (TOPROL-XL) 25 MG 24  hr tablet Take 25 mg by mouth 2 (two) times daily. Take once daily on dialysis days, Tuesday, Thursday and Saturday      . omeprazole (PRILOSEC) 20 MG capsule Take 20 mg by mouth daily.        . ondansetron (ZOFRAN-ODT) 4 MG disintegrating tablet Take 4 mg by mouth every 8 (eight) hours as needed. nausea      . polyethylene glycol (MIRALAX / GLYCOLAX) packet Take 17 g by mouth 2 (two) times daily. For constipation      . promethazine (PHENERGAN) 12.5 MG tablet Take 12.5 mg by mouth every 6 (six) hours as needed. For nausea       . senna (SENOKOT) 8.6 MG tablet Take 1 tablet by mouth every evening.       . sucralfate (CARAFATE) 1 G tablet Take 1 g by mouth 2 (two) times daily.        . tacrolimus (PROGRAF) 1 MG capsule Take 5 mg by mouth 2 (two) times daily.       . traMADol (ULTRAM) 50 MG tablet Take 50 mg by mouth every 6 (six) hours as needed. pain       Scheduled:    . aspirin  324 mg Oral NOW   Or  . aspirin  300 mg Rectal NOW  . aspirin EC  81 mg Oral Daily  . aspirin EC  81 mg Oral Daily  . calcium carbonate  1 tablet Oral Daily  . DULoxetine  60 mg Oral  Daily  . gabapentin  600 mg Oral QHS  . heparin  4,000 Units Intravenous Once  . insulin aspart  0-9 Units Subcutaneous Q4H  . insulin glargine  10 Units Subcutaneous QHS  . levothyroxine  200 mcg Oral Daily  . lisinopril  20 mg Oral QHS  . metoprolol  5 mg Intravenous Once  . metoprolol succinate  25 mg Oral BID  . nitroGLYCERIN  1 inch Topical Q6H  . sodium chloride  3 mL Intravenous Q12H  . sucralfate  1 g Oral BID  . tacrolimus  5 mg Oral BID    Assessment: 56yo female on HD TTS c/o CP, transferred to [facility] from [place] for cardiac evaluation, to begin heparin.  Goal of Therapy:  Heparin level 0.3-0.7 units/ml Monitor platelets by anticoagulation protocol: Yes   Plan:  Will give heparin 4000 units IV bolus x1 followed by gtt at 900 units/hr and monitor heparin levels and CBC.  Colleen Can PharmD BCPS 12/22/2011,4:26 AM

## 2011-12-22 NOTE — Consult Note (Signed)
[facility] Renal Consultation Note  Indication for Consultation:  Management of ESRD/hemodialysis; anemia, hypertension/volume and secondary hyperparathyroidism.  Also assistance with immunosuppression for liver transplant.  HPI: [name] is a 56 y.o. female with ESRD on dialysis on TTS in [place] who presented to the ED at [facility] last night with worsening mid-chest pain pressure with mild dyspnea since Saturday night (11/9) and was subsequently transferred to [facility] for cardiac evaluation.  On arrival, her chest pain was relieved with Nitro-paste, but her blood pressure was also uncontrolled, so she was given IV Hydralazine in addition to Lisinopril and Metoprolol PO.  EKG indicates left atrial enlargement, LVH, and possibly lateral ischemia, and Troponin is currently within normal range.  Cardiology consult is pending.  The patient currently states that chest pressure and dyspnea is much better.  Dialysis Orders: Center: [place] on TTS. EDW 67 kg  (weight today 71.4 kg) HD Bath 2K/2.25Ca  Time 3.5 hrs   Heparin 0.  Access Left thigh AVG   BFR 400 DFR 800    Zemplar 3 mcg IV/HD   Epogen 0 Units IV/HD  Venofer 0.  Past Medical History  Diagnosis Date  . Liver disease     cirrhosis and this is why she had the transplant in 2011  . Hepatitis C   . Chronic back pain   . Diverticulosis   . CAD (coronary artery disease)   . Thyroid disease   . Obesity   . Leg pain   . Peripheral vascular disease hands and legs  . Anxiety   . Blood transfusion   . Asthma   . Shortness of breath   . Headache     last migraine 6+yrs ago  . Arthritis     back  . Chronic back pain   . GERD (gastroesophageal reflux disease)     takes Omeprazole daily  . Chronic constipation     takes MIralax and Colace daily  . Oligouria   . Anemia     takes Folic Acid daily  . Hypothyroidism     takes Synthroid daily  . Diabetes mellitus     Levemir 2units daily if > 150  .  Depression     takes Cymbalta for "severe" depression  . Pneumonia   . Abdominal  pain, other specified site   . Neuromuscular disorder     carpal tunnel in right hand  . Hypertension     takes Metoprolol and Lisinopril daily, sees Dr Shary Decamp  . Chronic kidney disease     Tue, Thur, Sat, [facility], see Dr. Hyman Hopes   Past Surgical History  Procedure Date  . Liver transplant [facility]) 2011 10/25/2009    sees Dr Norma Fredrickson 1 every 6 months, saw last in Sep 2013. Odette Horns Coord [phone]  . Small intestine surgery 90's  . Thrombectomy   . Arteriovenous graft placement 10/03/10    Left forearm AVG  . Avgg removal 12/23/2010    Procedure: REMOVAL OF ARTERIOVENOUS GORETEX GRAFT (AVGG);  Surgeon: Sherren Kerns, MD;  Location: [facility] OR;  Service: Vascular;  Laterality: Left;  procedure started @1736 -1852  . Insertion of dialysis catheter 12/23/2010    Procedure: INSERTION OF DIALYSIS CATHETER;  Surgeon: Sherren Kerns, MD;  Location: [facility] OR;  Service: Vascular;  Laterality: Right;  Right Internal Jugular 28cm dialysis catheter insertion procedure time 1701-1720   . Cholecystectomy 1993  . Cystoscopy 15+yrs ago  . Back surgery 2010  . Av fistula placement 01/29/2011  Procedure: INSERTION OF ARTERIOVENOUS (AV) GORE-TEX GRAFT ARM;  Surgeon: Sherren Kerns, MD;  Location: [facility] OR;  Service: Vascular;  Laterality: Right;  . Tubal ligation   . Av fistula placement 03/10/2011    Procedure: INSERTION OF ARTERIOVENOUS (AV) GORE-TEX GRAFT THIGH;  Surgeon: Sherren Kerns, MD;  Location: [facility] OR;  Service: Vascular;  Laterality: Left;   Family History  Problem Relation Age of Onset  . Cancer Mother   . Diabetes Mother   . Hypertension Mother   . Stroke Mother   . Cancer Father   . Anesthesia problems Neg Hx   . Hypotension Neg Hx   . Malignant hyperthermia Neg Hx   . Pseudochol deficiency Neg Hx   Father died 65 colon cancer, mother died 81 breast cancer.  She had 5 sisters and 3  brothers.  Only 2 sisters and 1 brother are still living ("cancer, diabetes...").  2 daughters (heather Chambers--[place] and Finland Chambers--[place])--both healthy Social History She reports that she began smoking cigarettes when she was 62 (as much as 1 1/2 packs a day), stopped in 2011 for two years, and restarted six months ago (15 a day).  She previously drank alcohol, but denies any recent use.  She denies any history of illicit drugs.  She was married with two children, but is now divorced.  She grew up in [place], [place], went to [employer], and worked for [employer] in [place].  Allergies  Allergen Reactions  . Acetaminophen Other (See Comments)    Liver problems  . Codeine Itching   Prior to Admission medications   Medication Sig Start Date End Date Taking? Authorizing Provider  albuterol (PROVENTIL,VENTOLIN) 90 MCG/ACT inhaler Inhale 2 puffs into the lungs every 4 (four) hours as needed. For shortness of breath   Yes Historical Provider, MD  aspirin EC 81 MG tablet Take 81 mg by mouth daily.     Yes Historical Provider, MD  calcium carbonate (TUMS - DOSED IN MG ELEMENTAL CALCIUM) 500 MG chewable tablet Chew 1 tablet by mouth daily.    Yes Historical Provider, MD  docusate sodium (COLACE) 100 MG capsule Take 100 mg by mouth 2 (two) times daily.     Yes Historical Provider, MD  DULoxetine (CYMBALTA) 60 MG capsule Take 60 mg by mouth daily.     Yes Historical Provider, MD  FOLIC ACID PO Take 0.4 mg by mouth daily.     Yes Historical Provider, MD  gabapentin (NEURONTIN) 600 MG tablet Take 600 mg by mouth at bedtime.    Yes Historical Provider, MD  insulin detemir (LEVEMIR) 100 UNIT/ML injection Inject 2 Units into the skin at bedtime as needed. Uses only if blood sugar levels are over 150    Yes Historical Provider, MD  levothyroxine (SYNTHROID, LEVOTHROID) 175 MCG tablet Take 175 mcg by mouth daily.   Yes Historical Provider, MD  metoprolol tartrate (LOPRESSOR)  25 MG tablet Take 25 mg by mouth See admin instructions. On dialysis days- Tues, Thurs, and Sat   Yes Historical Provider, MD  omeprazole (PRILOSEC) 20 MG capsule Take 20 mg by mouth daily.     Yes Historical Provider, MD  ondansetron (ZOFRAN-ODT) 4 MG disintegrating tablet Take 4 mg by mouth every 8 (eight) hours as needed. nausea   Yes Historical Provider, MD  polyethylene glycol (MIRALAX / GLYCOLAX) packet Take 17 g by mouth 2 (two) times daily. For constipation   Yes Historical Provider, MD  promethazine (PHENERGAN) 12.5 MG tablet Take 12.5  mg by mouth every 6 (six) hours as needed. For nausea    Yes Historical Provider, MD  senna (SENOKOT) 8.6 MG tablet Take 1 tablet by mouth every evening.    Yes Historical Provider, MD  sucralfate (CARAFATE) 1 G tablet Take 1 g by mouth 2 (two) times daily.     Yes Historical Provider, MD  tacrolimus (PROGRAF) 1 MG capsule Take 5 mg by mouth 2 (two) times daily.    Yes Historical Provider, MD  traMADol (ULTRAM) 50 MG tablet Take 50 mg by mouth every 6 (six) hours as needed. pain   Yes Historical Provider, MD   Labs:  Results for orders placed during the hospital encounter of 12/22/11 (from the past 48 hour(s))  GLUCOSE, CAPILLARY     Status: Abnormal   Collection Time   12/22/11  3:57 AM      Component Value Range Comment   Glucose-Capillary 228 (*) 70 - 99 mg/dL   GLUCOSE, CAPILLARY     Status: Abnormal   Collection Time   12/22/11  8:24 AM      Component Value Range Comment   Glucose-Capillary 110 (*) 70 - 99 mg/dL   TROPONIN I     Status: Normal   Collection Time   12/22/11 10:30 AM      Component Value Range Comment   Troponin I <0.30  <0.30 ng/mL   CBC     Status: Abnormal   Collection Time   12/22/11 10:30 AM      Component Value Range Comment   WBC 5.7  4.0 - 10.5 K/uL    RBC 3.54 (*) 3.87 - 5.11 MIL/uL    Hemoglobin 11.0 (*) 12.0 - 15.0 g/dL    HCT 95.6 (*) 21.3 - 46.0 %    MCV 91.5  78.0 - 100.0 fL    MCH 31.1  26.0 - 34.0 pg     MCHC 34.0  30.0 - 36.0 g/dL    RDW 08.6  57.8 - 46.9 %    Platelets 111 (*) 150 - 400 K/uL CONSISTENT WITH PREVIOUS RESULT  BASIC METABOLIC PANEL     Status: Abnormal   Collection Time   12/22/11 10:30 AM      Component Value Range Comment   Sodium 133 (*) 135 - 145 mEq/L    Potassium 4.7  3.5 - 5.1 mEq/L    Chloride 92 (*) 96 - 112 mEq/L    CO2 22  19 - 32 mEq/L    Glucose, Bld 114 (*) 70 - 99 mg/dL    BUN 62 (*) 6 - 23 mg/dL    Creatinine, Ser 6.29 (*) 0.50 - 1.10 mg/dL    Calcium 8.9  8.4 - 52.8 mg/dL    GFR calc non Af Amer 4 (*) >90 mL/min    GFR calc Af Amer 5 (*) >90 mL/min   PROTIME-INR     Status: Normal   Collection Time   12/22/11 10:30 AM      Component Value Range Comment   Prothrombin Time 12.6  11.6 - 15.2 seconds    INR 0.95  0.00 - 1.49   LIPID PANEL     Status: Abnormal   Collection Time   12/22/11 10:30 AM      Component Value Range Comment   Cholesterol 225 (*) 0 - 200 mg/dL    Triglycerides 413  <244 mg/dL    HDL 97  >01 mg/dL    Total CHOL/HDL Ratio 2.3  VLDL 24  0 - 40 mg/dL    LDL Cholesterol 956 (*) 0 - 99 mg/dL   RENAL FUNCTION PANEL     Status: Abnormal   Collection Time   12/22/11 10:30 AM      Component Value Range Comment   Sodium 134 (*) 135 - 145 mEq/L    Potassium 4.6  3.5 - 5.1 mEq/L    Chloride 92 (*) 96 - 112 mEq/L    CO2 22  19 - 32 mEq/L    Glucose, Bld 113 (*) 70 - 99 mg/dL    BUN 62 (*) 6 - 23 mg/dL    Creatinine, Ser 2.13 (*) 0.50 - 1.10 mg/dL    Calcium 8.8  8.4 - 08.6 mg/dL    Phosphorus 8.3 (*) 2.3 - 4.6 mg/dL    Albumin 2.8 (*) 3.5 - 5.2 g/dL    GFR calc non Af Amer 4 (*) >90 mL/min    GFR calc Af Amer 5 (*) >90 mL/min   GLUCOSE, CAPILLARY     Status: Abnormal   Collection Time   12/22/11 11:48 AM      Component Value Range Comment   Glucose-Capillary 137 (*) 70 - 99 mg/dL    Comment 1 Notify RN      Constitutional: positive for chills, negative for chills, fatigue, fevers and sweats Ears, nose, mouth, throat,  and face: negative for hearing loss, hoarseness, nasal congestion and sore throat Respiratory: positive for mild dyspnea Cardiovascular: positive for chest pressure/discomfort and dyspnea, negative for lower extremity edema and palpitations Gastrointestinal: negative for abdominal pain, change in bowel habits, nausea and vomiting Genitourinary:negative, anuric Musculoskeletal:negative for back pain, bone pain, muscle weakness and neck pain Neurological: positive for dizziness, negative for gait problems, headaches and speech problems  Physical Exam: Filed Vitals:   12/22/11 0953  BP: 140/68  Pulse: 75  Temp:   Resp:      General appearance: alert, cooperative and no distress Head: Normocephalic, without obvious abnormality, atraumatic Neck: no adenopathy, no carotid bruit, no JVD and supple, symmetrical, trachea midline Resp: clear to auscultation bilaterally Cardio: regular rate and rhythm, S1, S2 normal, no murmur, click, rub or gallop GI: + BS, soft and nontender, surgical scars Extremities: extremities normal, atraumatic, no cyanosis or edema Neurologic: Grossly normal Dialysis Access: Left thigh AVG with + bruit   CXR--no acute cardiopulm. Abnormalities seen on CXR from [facility] earlier today.    Assessment/Plan: 1. Chest pressure/dyspnea - Hx CAD; much better on IV Heparin, Nitro-paste, ASA, beta-blocker, ACE inhibitor; cardiac enzymes currently negative; no chest x-ray, but symptoms began post-HD on 11/9.  Cardiology consult pending. 2. ESRD -  HD on TTS in ; K 4.7.  HD today. 3. Hypertension/volume - BP now 172/76 on Metoprolol 25 mg bid (hold pre-HD) as outpatient, but 50 mg bid here with  Lisinopril 20 mg qhs; current wt 71.4 kg with EDW 67 kg. 4. Anemia  - Hgb 11, no outpatient meds. 5. Metabolic bone disease -  Ca 8.9 (9.9 corrected), on Zemplar 3 mcg, Sensipar 30 mg, Phoslyra with meals. 6. Nutrition - Alb 2.8.  7. Hx liver transplant - 2011 in [place], secondary to hepatitis C; on Prograf 5 mg bid. 8. DM - on Levemir. 9. Hx Crohn's disease   LYLES,CHARLES 12/22/2011,  I've talked with and examined Mrs. Ngo and have reviewed her records from this hospitalization as well as from outpt dialysis center. Chest pain is better.  Although she's on T-Th-Sat HD  as outpt, she will get HD today.  BP and weight are up and she has had some DOE.  Will continue with Zemplar and Sensipar as well as tacrlimus for transplant.  Will decrease gabapentin to 300 qhs and d/c sucralfate. Attending Nephrologist: Marina Gravel, MD

## 2011-12-23 ENCOUNTER — Observation Stay (HOSPITAL_COMMUNITY): Payer: Medicare Other

## 2011-12-23 DIAGNOSIS — Z944 Liver transplant status: Secondary | ICD-10-CM | POA: Diagnosis not present

## 2011-12-23 DIAGNOSIS — N186 End stage renal disease: Secondary | ICD-10-CM | POA: Diagnosis not present

## 2011-12-23 DIAGNOSIS — D631 Anemia in chronic kidney disease: Secondary | ICD-10-CM | POA: Diagnosis not present

## 2011-12-23 DIAGNOSIS — I120 Hypertensive chronic kidney disease with stage 5 chronic kidney disease or end stage renal disease: Secondary | ICD-10-CM | POA: Diagnosis not present

## 2011-12-23 DIAGNOSIS — R079 Chest pain, unspecified: Secondary | ICD-10-CM | POA: Diagnosis not present

## 2011-12-23 LAB — GLUCOSE, CAPILLARY
Glucose-Capillary: 136 mg/dL — ABNORMAL HIGH (ref 70–99)
Glucose-Capillary: 96 mg/dL (ref 70–99)
Glucose-Capillary: 123 mg/dL — ABNORMAL HIGH (ref 70–99)

## 2011-12-23 LAB — RENAL FUNCTION PANEL
Albumin: 2.5 g/dL — ABNORMAL LOW (ref 3.5–5.2)
CO2: 27 mEq/L (ref 19–32)
Calcium: 8.9 mg/dL (ref 8.4–10.5)
Creatinine, Ser: 5.3 mg/dL — ABNORMAL HIGH (ref 0.50–1.10)
GFR calc Af Amer: 9 mL/min — ABNORMAL LOW (ref >90)
GFR calc non Af Amer: 8 mL/min — ABNORMAL LOW (ref >90)
Phosphorus: 6.7 mg/dL — ABNORMAL HIGH (ref 2.3–4.6)
Sodium: 136 mEq/L (ref 135–145)

## 2011-12-23 LAB — HEMOGLOBIN A1C
Hgb A1c MFr Bld: 8.1 % — ABNORMAL HIGH (ref <5.7)
Mean Plasma Glucose: 186 mg/dL — ABNORMAL HIGH (ref <117)

## 2011-12-23 LAB — CBC
MCH: 31.7 pg (ref 26.0–34.0)
Platelets: 116 10*3/uL — ABNORMAL LOW (ref 150–400)
RBC: 3.34 MIL/uL — ABNORMAL LOW (ref 3.87–5.11)
RDW: 13.2 % (ref 11.5–15.5)

## 2011-12-23 MED ORDER — SODIUM CHLORIDE 0.9 % IV SOLN: 100.0000 mL | INTRAVENOUS | Status: DC | PRN

## 2011-12-23 MED ORDER — TECHNETIUM TC 99M SESTAMIBI GENERIC - CARDIOLITE
10.0000 | Freq: Once | INTRAVENOUS | Status: AC | PRN
  Administered 2011-12-23: 10 via INTRAVENOUS

## 2011-12-23 MED ORDER — LISINOPRIL 10 MG PO TABS
10.0000 mg | ORAL_TABLET | Freq: Every day | ORAL | Status: DC
  Administered 2011-12-24: 10 mg via ORAL
  Filled 2011-12-23 (×3): qty 1

## 2011-12-23 MED ORDER — LIDOCAINE HCL (PF) 1 % IJ SOLN: 5.0000 mL | INTRAMUSCULAR | Status: DC | PRN

## 2011-12-23 MED ORDER — PENTAFLUOROPROP-TETRAFLUOROETH EX AERO: 1.0000 "application " | INHALATION_SPRAY | CUTANEOUS | Status: DC | PRN

## 2011-12-23 MED ORDER — NEPRO/CARBSTEADY PO LIQD
237.0000 mL | ORAL | Status: DC | PRN
  Filled 2011-12-23: qty 237

## 2011-12-23 MED ORDER — LIDOCAINE-PRILOCAINE 2.5-2.5 % EX CREA: 1.0000 "application " | TOPICAL_CREAM | CUTANEOUS | Status: DC | PRN

## 2011-12-23 MED ORDER — REGADENOSON 0.4 MG/5ML IV SOLN
0.4000 mg | Freq: Once | INTRAVENOUS | Status: AC
  Administered 2011-12-23: 0.4 mg via INTRAVENOUS
  Filled 2011-12-23: qty 5

## 2011-12-23 MED ORDER — GLUCAGON HCL (RDNA) 1 MG IJ SOLR
INTRAMUSCULAR | Status: AC
  Administered 2011-12-23: 1 mg
  Filled 2011-12-23: qty 1

## 2011-12-23 MED ORDER — TECHNETIUM TC 99M SESTAMIBI GENERIC - CARDIOLITE
30.0000 | Freq: Once | INTRAVENOUS | Status: AC | PRN
  Administered 2011-12-23: 30 via INTRAVENOUS

## 2011-12-23 NOTE — Progress Notes (Signed)
  Echocardiogram 2D Echocardiogram has been performed.  Cathie Beams 12/23/2011, 11:51 AM

## 2011-12-23 NOTE — Progress Notes (Signed)
Subjective: Episode of chest pain last pm relief with tylenol  Objective: Vital signs in last 24 hours: Temp:  [97 F (36.1 C)-98.4 F (36.9 C)] 98.2 F (36.8 C) (11/12 0341) Pulse Rate:  [68-88] 68  (11/12 0900) Resp:  [13-20] 18  (11/12 0341) BP: (77-172)/(36-85) 101/54 mmHg (11/12 0900) SpO2:  [98 %-100 %] 98 % (11/12 0341) Weight:  [68.8 kg (151 lb 10.8 oz)-71.7 kg (158 lb 1.1 oz)] 68.8 kg (151 lb 10.8 oz) (11/12 0341) Weight change: 0.304 kg (10.7 oz) Last BM Date: 12/21/11 Intake/Output from previous day: 11/11 0701 - 11/12 0700 In: 407 [P.O.:240; I.V.:165; IV Piggyback:2] Out: 1891  Intake/Output this shift:    PE: General:laert and oriented Heart:S1S2 RRR Lungs:clear without rales, rhonchi or wheezes Abd:+ BS, soft, non tender Ext:no edema Neuro:alert and oriented    Lab Results:  Basename 12/23/11 0510 12/22/11 1030  WBC 7.6 5.7  HGB 10.6* 11.0*  HCT 30.8* 32.4*  PLT 116* 111*   BMET  Basename 12/23/11 0510 12/22/11 1030  NA 136 133*134*  K 4.3 4.74.6  CL 96 92*92*  CO2 27 2222  GLUCOSE 148* 114*113*  BUN 33* 62*62*  CREATININE 5.30* 8.68*8.71*  CALCIUM 8.9 8.98.8    Basename 12/22/11 2131 12/22/11 1502  TROPONINI <0.30 <0.30    Lab Results  Component Value Date   CHOL 225* 12/22/2011   HDL 97 12/22/2011   LDLCALC 104* 12/22/2011   TRIG 121 12/22/2011   CHOLHDL 2.3 12/22/2011   Lab Results  Component Value Date   HGBA1C 8.1* 12/22/2011     Lab Results  Component Value Date   TSH 42.28* 02/19/2006    Hepatic Function Panel  Basename 12/23/11 0510  PROT --  ALBUMIN 2.5*  AST --  ALT --  ALKPHOS --  BILITOT --  BILIDIR --  IBILI --    Basename 12/22/11 1030  CHOL 225*   No results found for this basename: PROTIME in the last 72 hours    EKG: Orders placed during the hospital encounter of 12/22/11  . EKG 12-LEAD  . EKG 12-LEAD  . EKG 12-LEAD    Studies/Results: No results found.  Medications: I have  reviewed the patient's current medications.    [name] aspirin EC  81 mg Oral Daily  . atorvastatin  10 mg Oral q1800  . calcium carbonate  1 tablet Oral Daily  . DULoxetine  60 mg Oral Daily  . gabapentin  300 mg Oral QHS  . [COMPLETED] glucagon      . insulin aspart  0-9 Units Subcutaneous Q4H  . insulin detemir  10 Units Subcutaneous QHS  . levothyroxine  200 mcg Oral Daily  . lisinopril  20 mg Oral QHS  . metoprolol succinate  50 mg Oral BID  . nitroGLYCERIN  1 inch Topical Q6H  . pantoprazole  40 mg Oral Q0600  . paricalcitol  3 mcg Intravenous 3 times weekly  . [COMPLETED] regadenoson  0.4 mg Intravenous Once  . sodium chloride  3 mL Intravenous Q12H  . tacrolimus  5 mg Oral BID  . [DISCONTINUED] aspirin EC  81 mg Oral Daily  . [DISCONTINUED] gabapentin  300 mg Oral QHS  . [DISCONTINUED] gabapentin  600 mg Oral QHS  . [DISCONTINUED] insulin glargine  10 Units Subcutaneous QHS  . [DISCONTINUED] metoprolol succinate  25 mg Oral BID  . [DISCONTINUED] sucralfate  1 g Oral BID   Assessment/Plan: Principal Problem:  *Chest pain Active Problems:  HEPATITIS C  DIABETES MELLITUS,  TYPE II  ANEMIA, CHRONIC  HYPERTENSION  End stage renal disease dialysis t/[name]/sat  S/P liver transplant  EKG abnormalities  PLAN:  Nuc. myoview complete without side effects  LOS: 1 day   [name],[name] 12/23/2011, 9:08 AM   Agree with note written by Nada Boozer RNP  W/U of CP in progress. + CRF. Enz neg. EKG shows NSSTTWC/LVH. Myoview today. If neg for ischemia, medical therapy.  [name] 12/23/2011 9:18 AM

## 2011-12-23 NOTE — Care Management Note (Signed)
    Page 1 of 1   12/26/2011     2:25:20 PM   CARE MANAGEMENT NOTE 12/26/2011  Patient:  [name], [name]   Account Number:  [id number]  Date Initiated:  12/23/2011  Documentation initiated by:  Arch Methot  Subjective/Objective Assessment:   PT ADM ON 12/22/11  WITH CHEST PAIN.  PTA, PT INDEPENDENT, LIVES WITH DAUGHTER.     Action/Plan:   WILL FOLLOW FOR HOME NEEDS AS PT PROGRESSES.   Anticipated DC Date:  12/24/2011   Anticipated DC Plan:  HOME/SELF CARE      DC Planning Services  CM consult      Choice offered to / List presented to:             Status of service:  Completed, signed off Medicare Important Message given?   (If response is "NO", the following Medicare IM given date fields will be blank) Date Medicare IM given:   Date Additional Medicare IM given:    Discharge Disposition:  HOME/SELF CARE  Per UR Regulation:  Reviewed for med. necessity/level of care/duration of stay  If discussed at Long Length of Stay Meetings, dates discussed:    Comments:  12/25/11 Tanique Matney,RN,BSN [phone] MET WITH PT TO DISCUSS DC PLANS.  PT LIVES WITH DAUGHTER, WHO WORKS.  PT STATES SHE IS INDEPENDENT, AND HAS NO NEEDS AT HOME AT THIS TIME.

## 2011-12-23 NOTE — Progress Notes (Addendum)
ANTICOAGULATION CONSULT NOTE - Follow Up Consult  Pharmacy Consult for heparin Indication: chest pain/ACS  Labs:  Basename 12/23/11 0510 12/22/11 2131 12/22/11 1502 12/22/11 1030  HGB 10.6* -- -- 11.0*  HCT 30.8* -- -- 32.4*  PLT 116* -- -- 111*  APTT -- -- -- --  LABPROT -- -- -- 12.6  INR -- -- -- 0.95  HEPARINUNFRC 0.27* -- 0.47 --  CREATININE -- -- -- 8.68*8.71*  CKTOTAL -- -- -- --  CKMB -- -- -- --  TROPONINI -- <0.30 <0.30 <0.30    Assessment: 56yo female slightly subtherapeutic on heparin after one level at goal; going for stress test today.  Goal of Therapy:  Heparin level 0.3-0.7 units/ml   Plan:  Will increase heparin gtt slightly to 1100 units/hr  Follow up AM heparin level  Okey Regal, PharmD 12/23/2011,6:42 AM

## 2011-12-23 NOTE — Progress Notes (Signed)
[facility] PROGRESS NOTE  [name] ZOX:[id number] DOB: 11-25-1955 DOA: 12/22/2011 PCP: [name], MD  Assessment/Plan: Principal Problem:  *Chest pain Active Problems:  HEPATITIS C  DIABETES MELLITUS, TYPE II  ANEMIA, CHRONIC  HYPERTENSION  End stage renal disease dialysis t/r/sat  S/P liver transplant  EKG abnormalities    1. Chest pain: Patient presented with recurrent retrosternal chest pain since 12/20/11. 12-lead EKG showed old anterior Q waves, ant T inversion in leads 1 and AVL. Unfortunately, no old EKGs are available for comparison. Cardiac remained unelevated, but patient as does have significant risk factors for CAD, cardiology consultation was requested, and provided by Dr Rennis Golden, [facility]. Patient is s/p stress Myoview on 12/13/11, and report is pending. Currently on iv Heparin, beta-blocker and low dose ASA. Nitropaste was discontinued, secondary to hypotension.  2. HTN: Patient appears to have difficult-to-control HTN, despite pre-admission ACE-i and beta-blocker. Adjusting medications as indicated, to optimize control.  3. DM-2: This is insulin-dependent type 2, and appears uncontrolled, based on random blood glucose. Managing with diet, Levemir and SSI. CBGs are controlled today. HBA1C is 8.1. 4. ESRD: On HD, T-T-S. Dr Marina Gravel provided nephrology consultation, and patient was dialyzed on 12/22/11, due to volume overload. She is scheduled for repeat dialysis on 12/23/11.  5. Hypothyroidism: Continued on thyroxine replacement therapy.  6. GERD: Asymptomatic, although may be the culprit for chest pain, if CAD ruled out. Managing with PPI.   Code Status: Full Code.  Family Communication:  Disposition Plan: Per cardiology.    Brief narrative: 56 yo female with history of tobacco abuse, ESRD on HD T-T-S dialysis, hepatitis C, s/p liver transplant 2011, HTN, DM, CAD, diverticulosis, chronic back pain, hypothyroidism, GERD, PVD, obesity, asthma, anxiety,  transfered from outside facility for sub-sternal chest pain without radiation, with associated mild SOB. No fevers, no LE edema. Initially relieved in ED, but recurrent. Stress testing was done years ago. Compliant with meds and dialysis but BP is uncontrolled currently. Admitted for further evaluation and management.    Consultants:  Dr Zoila Shutter, [facility].  Dr Marina Gravel, Nephrology.   Procedures:  N/A.  Antibiotics:  N/A  HPI/Subjective: Twinge of chest discomfort in early AM.  Objective: Vital signs in last 24 hours: Temp:  [97 F (36.1 C)-98.4 F (36.9 C)] 98.2 F (36.8 C) (11/12 0341) Pulse Rate:  [68-90] 90  (11/12 0911) Resp:  [13-20] 18  (11/12 0341) BP: (77-172)/(36-85) 103/52 mmHg (11/12 0915) SpO2:  [98 %-100 %] 98 % (11/12 0341) Weight:  [68.8 kg (151 lb 10.8 oz)-71.7 kg (158 lb 1.1 oz)] 68.8 kg (151 lb 10.8 oz) (11/12 0341) Weight change: 0.304 kg (10.7 oz) Last BM Date: 12/21/11  Intake/Output from previous day: 11/11 0701 - 11/12 0700 In: 407 [P.O.:240; I.V.:165; IV Piggyback:2] Out: 1891      Physical Exam: General: Seen in the Nuclear Medicine department. Comfortable, alert, communicative, fully oriented, not short of breath at rest.  HEENT:  Mild clinical pallor, no jaundice, no conjunctival injection or discharge. NECK:  Supple, JVP not seen, no carotid bruits, no palpable lymphadenopathy, no palpable goiter. CHEST:  Clinically clear to auscultation, no wheezes, no crackles. HEART:  Sounds 1 and 2 heard, normal, regular, no murmurs. ABDOMEN:  Full, soft, old surgical scars, non-tender, no palpable organomegaly, no palpable masses, normal bowel sounds. GENITALIA:  Not examined. LOWER EXTREMITIES:  No pitting edema, palpable peripheral pulses. Has left upper thigh functioning AV fistula.  MUSCULOSKELETAL SYSTEM:  Unremarkable.  CENTRAL NERVOUS SYSTEM:  No focal neurologic deficit on gross examination.  Lab Results:  Basename  12/23/11 0510 12/22/11 1030  WBC 7.6 5.7  HGB 10.6* 11.0*  HCT 30.8* 32.4*  PLT 116* 111*    Basename 12/23/11 0510 12/22/11 1030  NA 136 133*134*  K 4.3 4.74.6  CL 96 92*92*  CO2 27 2222  GLUCOSE 148* 114*113*  BUN 33* 62*62*  CREATININE 5.30* 8.68*8.71*  CALCIUM 8.9 8.98.8   No results found for this or any previous visit (from the past 240 hour(s)).   Studies/Results: No results found.  Medications: Scheduled Meds:    . aspirin EC  81 mg Oral Daily  . atorvastatin  10 mg Oral q1800  . calcium carbonate  1 tablet Oral Daily  . DULoxetine  60 mg Oral Daily  . gabapentin  300 mg Oral QHS  . [COMPLETED] glucagon      . insulin aspart  0-9 Units Subcutaneous Q4H  . insulin detemir  10 Units Subcutaneous QHS  . levothyroxine  200 mcg Oral Daily  . lisinopril  20 mg Oral QHS  . metoprolol succinate  50 mg Oral BID  . nitroGLYCERIN  1 inch Topical Q6H  . pantoprazole  40 mg Oral Q0600  . paricalcitol  3 mcg Intravenous 3 times weekly  . [COMPLETED] regadenoson  0.4 mg Intravenous Once  . sodium chloride  3 mL Intravenous Q12H  . tacrolimus  5 mg Oral BID  . [DISCONTINUED] aspirin EC  81 mg Oral Daily  . [DISCONTINUED] gabapentin  300 mg Oral QHS  . [DISCONTINUED] gabapentin  600 mg Oral QHS  . [DISCONTINUED] sucralfate  1 g Oral BID   Continuous Infusions:    . heparin 1,100 Units/hr (12/23/11 0643)   PRN Meds:.sodium chloride, acetaminophen, hydrALAZINE, morphine injection, nitroGLYCERIN, ondansetron (ZOFRAN) IV, sodium chloride    LOS: 1 day   Zion Lint,CHRISTOPHER  [facility] Pager [phone]. If 8PM-8AM, please contact night-coverage at [email or web address], password [facility] 12/23/2011, 10:34 AM  LOS: 1 day

## 2011-12-23 NOTE — Progress Notes (Signed)
Subjective:  Just returned from cardiac stress test, but chest pressure resolved and breathing better after dialysis yesterday.  Objective: Vital signs in last 24 hours: Temp:  [97 F (36.1 C)-98.4 F (36.9 C)] 98.2 F (36.8 C) (11/12 0341) Pulse Rate:  [68-90] 90  (11/12 0911) Resp:  [13-20] 18  (11/12 0341) BP: (77-167)/(36-85) 103/52 mmHg (11/12 0915) SpO2:  [98 %-100 %] 98 % (11/12 0341) Weight:  [68.8 kg (151 lb 10.8 oz)-71.7 kg (158 lb 1.1 oz)] 68.8 kg (151 lb 10.8 oz) (11/12 0341) Weight change: 0.304 kg (10.7 oz)  Intake/Output from previous day: 11/11 0701 - 11/12 0700 In: 407 [P.O.:240; I.V.:165; IV Piggyback:2] Out: 1891    EXAM: General appearance:  Alert, in no apparent distress Resp:  CTA without rales, rhonchi, or wheezes Cardio:  RRR without murmur or rub GI: + BS, soft and nontender Extremities:  No edema Access:  Left thigh AVG with + bruit  Lab Results:  Basename 12/23/11 0510 12/22/11 1030  WBC 7.6 5.7  HGB 10.6* 11.0*  HCT 30.8* 32.4*  PLT 116* 111*   BMET:  Basename 12/23/11 0510 12/22/11 1030  NA 136 133*134*  K 4.3 4.74.6  CL 96 92*92*  CO2 27 2222  GLUCOSE 148* 114*113*  BUN 33* 62*62*  CREATININE 5.30* 8.68*8.71*  CALCIUM 8.9 8.98.8  ALBUMIN 2.5* 2.8*   No results found for this basename: PTH:2 in the last 72 hours Iron Studies: No results found for this basename: IRON,TIBC,TRANSFERRIN,FERRITIN in the last 72 hours  Dialysis Orders: Center: [facility] on TTS.  EDW 67 kg (weight today 71.4 kg) HD Bath 2K/2.25Ca Time 3.5 hrs Heparin 0. Access Left thigh AVG BFR 400 DFR 800  Zemplar 3 mcg IV/HD Epogen 0 Units IV/HD Venofer 0.  Assessment/Plan: 1. Chest pressure/dyspnea - Hx CAD; cardiac enzymes negative; workup per Cardiology in progress; Myoview this AM; symptoms began post-HD on 11/9; much better s/p HD yesterday. 2. ESRD - HD on TTS in [place]; K 4.3. HD again today on regular schedule. 3. Hypertension/volume - BP now 103/ 52  on Metoprolol 25 mg bid (hold pre-HD) as outpatient, but 50 mg bid here with Lisinopril 20 mg qhs; wt 68.8 kg s/p net UF 1.9 L yesterday (EDW 67 kg). Maintain EDW. 4. Anemia - Hgb 10.6, no outpatient meds. 5. Metabolic bone disease - Ca 8.9 (10.1 corrected), P 6.7 on Zemplar 3 mcg, Sensipar 30 mg, Phoslyra with meals. 6. Nutrition - Alb 2.5.  7. Hx liver transplant - 2011 in [place], secondary to hepatitis C; on Prograf 5 mg bid. 8. DM - on Levemir. 9. Hx Crohn's disease     LOS: 1 day   LYLES,CHARLES Ms. Wertman feels much better--no further chest pain.  BP is low on lisinopril and higher dose of B blocker.  I've decrease lisinopril to 10/d.  Stress test done earlier today--? results.  Will dialyze only 2 1/2 hr today (had dialysis yesterday).  Agree with plans as outlined above. 12/23/2011,12:44 PM

## 2011-12-24 ENCOUNTER — Encounter (HOSPITAL_COMMUNITY)
Admission: EM | Disposition: A | Discharge status: Home / Self Care | Payer: Self-pay | Source: Other Acute Inpatient Hospital | Attending: Internal Medicine

## 2011-12-24 DIAGNOSIS — Z944 Liver transplant status: Secondary | ICD-10-CM | POA: Diagnosis not present

## 2011-12-24 DIAGNOSIS — I120 Hypertensive chronic kidney disease with stage 5 chronic kidney disease or end stage renal disease: Secondary | ICD-10-CM | POA: Diagnosis not present

## 2011-12-24 DIAGNOSIS — N039 Chronic nephritic syndrome with unspecified morphologic changes: Secondary | ICD-10-CM | POA: Diagnosis not present

## 2011-12-24 DIAGNOSIS — R079 Chest pain, unspecified: Secondary | ICD-10-CM | POA: Diagnosis not present

## 2011-12-24 DIAGNOSIS — B1710 Acute hepatitis C without hepatic coma: Secondary | ICD-10-CM | POA: Diagnosis not present

## 2011-12-24 HISTORY — PX: LEFT HEART CATHETERIZATION WITH CORONARY/GRAFT ANGIOGRAM: SHX5450

## 2011-12-24 LAB — RENAL FUNCTION PANEL
Albumin: 2.5 g/dL — ABNORMAL LOW (ref 3.5–5.2)
Calcium: 8.8 mg/dL (ref 8.4–10.5)
Creatinine, Ser: 4.95 mg/dL — ABNORMAL HIGH (ref 0.50–1.10)
GFR calc non Af Amer: 9 mL/min — ABNORMAL LOW (ref >90)

## 2011-12-24 LAB — GLUCOSE, CAPILLARY
Glucose-Capillary: 142 mg/dL — ABNORMAL HIGH (ref 70–99)
Glucose-Capillary: 185 mg/dL — ABNORMAL HIGH (ref 70–99)
Glucose-Capillary: 220 mg/dL — ABNORMAL HIGH (ref 70–99)

## 2011-12-24 LAB — CBC
MCH: 30.9 pg (ref 26.0–34.0)
MCHC: 33.6 g/dL (ref 30.0–36.0)
MCV: 92.1 fL (ref 78.0–100.0)
Platelets: 97 10*3/uL — ABNORMAL LOW (ref 150–400)
RDW: 13.1 % (ref 11.5–15.5)

## 2011-12-24 SURGERY — LEFT HEART CATHETERIZATION WITH CORONARY/GRAFT ANGIOGRAM
Anesthesia: LOCAL

## 2011-12-24 MED ORDER — SODIUM CHLORIDE 0.9 % IJ SOLN: 3.0000 mL | INTRAMUSCULAR | Status: DC | PRN

## 2011-12-24 MED ORDER — MIDAZOLAM HCL 2 MG/2ML IJ SOLN
INTRAMUSCULAR | Status: AC
  Filled 2011-12-24: qty 1

## 2011-12-24 MED ORDER — LIDOCAINE-EPINEPHRINE 1 %-1:100000 IJ SOLN
INTRAMUSCULAR | Status: AC
  Filled 2011-12-24: qty 1

## 2011-12-24 MED ORDER — INSULIN ASPART 100 UNIT/ML ~~LOC~~ SOLN
0.0000 [IU] | Freq: Three times a day (TID) | SUBCUTANEOUS | Status: DC
  Administered 2011-12-24: 1 [IU] via SUBCUTANEOUS

## 2011-12-24 MED ORDER — POLYETHYLENE GLYCOL 3350 17 G PO PACK
17.0000 g | PACK | Freq: Every day | ORAL | Status: DC
  Administered 2011-12-24: 17 g via ORAL
  Filled 2011-12-24 (×2): qty 1

## 2011-12-24 MED ORDER — SODIUM CHLORIDE 0.9 % IV SOLN: 250.0000 mL | INTRAVENOUS | Status: DC | PRN

## 2011-12-24 MED ORDER — ONDANSETRON HCL 4 MG/2ML IJ SOLN: 4.0000 mg | Freq: Four times a day (QID) | INTRAMUSCULAR | Status: DC | PRN

## 2011-12-24 MED ORDER — ALTEPLASE 2 MG IJ SOLR: 2.0000 mg | Freq: Once | INTRAMUSCULAR | Status: AC | PRN

## 2011-12-24 MED ORDER — NITROGLYCERIN 0.2 MG/ML ON CALL CATH LAB
INTRAVENOUS | Status: AC
  Filled 2011-12-24: qty 1

## 2011-12-24 MED ORDER — LIDOCAINE HCL (PF) 1 % IJ SOLN
INTRAMUSCULAR | Status: AC
  Filled 2011-12-24: qty 30

## 2011-12-24 MED ORDER — SODIUM CHLORIDE 0.9 % IV SOLN: 100.0000 mL | INTRAVENOUS | Status: DC | PRN

## 2011-12-24 MED ORDER — SODIUM CHLORIDE 0.9 % IJ SOLN: 3.0000 mL | Freq: Two times a day (BID) | INTRAMUSCULAR | Status: DC

## 2011-12-24 MED ORDER — ACETAMINOPHEN 325 MG PO TABS: 650.0000 mg | ORAL_TABLET | ORAL | Status: DC | PRN

## 2011-12-24 MED ORDER — FENTANYL CITRATE 0.05 MG/ML IJ SOLN
INTRAMUSCULAR | Status: AC
  Filled 2011-12-24: qty 0.5

## 2011-12-24 MED ORDER — PENTAFLUOROPROP-TETRAFLUOROETH EX AERO: 1.0000 "application " | INHALATION_SPRAY | CUTANEOUS | Status: DC | PRN

## 2011-12-24 MED ORDER — INSULIN ASPART 100 UNIT/ML ~~LOC~~ SOLN
0.0000 [IU] | SUBCUTANEOUS | Status: DC
  Administered 2011-12-25: 3 [IU] via SUBCUTANEOUS

## 2011-12-24 MED ORDER — LIDOCAINE-PRILOCAINE 2.5-2.5 % EX CREA: 1.0000 "application " | TOPICAL_CREAM | CUTANEOUS | Status: DC | PRN

## 2011-12-24 MED ORDER — LIDOCAINE HCL (PF) 1 % IJ SOLN: 5.0000 mL | INTRAMUSCULAR | Status: DC | PRN

## 2011-12-24 MED ORDER — HEPARIN (PORCINE) IN NACL 2-0.9 UNIT/ML-% IJ SOLN
INTRAMUSCULAR | Status: AC
  Filled 2011-12-24: qty 1000

## 2011-12-24 MED ORDER — HEPARIN SODIUM (PORCINE) 1000 UNIT/ML DIALYSIS: 1000.0000 [IU] | INTRAMUSCULAR | Status: DC | PRN

## 2011-12-24 MED ORDER — DIAZEPAM 2 MG PO TABS
2.0000 mg | ORAL_TABLET | ORAL | Status: AC
  Administered 2011-12-24: 2 mg via ORAL
  Filled 2011-12-24: qty 1

## 2011-12-24 MED ORDER — HEPARIN SODIUM (PORCINE) 1000 UNIT/ML DIALYSIS: 20.0000 [IU]/kg | INTRAMUSCULAR | Status: DC | PRN

## 2011-12-24 MED ORDER — ASPIRIN 81 MG PO CHEW: 324.0000 mg | CHEWABLE_TABLET | ORAL | Status: DC

## 2011-12-24 MED ORDER — SODIUM CHLORIDE 0.9 % IV SOLN: INTRAVENOUS | Status: DC

## 2011-12-24 MED ORDER — SODIUM CHLORIDE 0.9 % IV SOLN
INTRAVENOUS | Status: AC
  Administered 2011-12-24: 18:00:00 via INTRAVENOUS
  Administered 2011-12-24: 1000 mL via INTRAVENOUS

## 2011-12-24 MED ORDER — NEPRO/CARBSTEADY PO LIQD: 237.0000 mL | ORAL | Status: DC | PRN

## 2011-12-24 NOTE — H&P (Signed)
  H & P will be scanned in.  Pt was reexamined and existing H & P reviewed. No changes found.  [name], MD [facility] 12/24/2011 2:58 PM

## 2011-12-24 NOTE — Progress Notes (Signed)
Subjective: Did have episode of chest pain last pm after dialysis  Objective: Vital signs in last 24 hours: Temp:  [97.7 F (36.5 C)-98.6 F (37 C)] 98.4 F (36.9 C) (11/13 0426) Pulse Rate:  [66-79] 66  (11/13 1041) Resp:  [12-20] 18  (11/13 0426) BP: (77-173)/(45-81) 173/81 mmHg (11/13 1041) SpO2:  [99 %-100 %] 100 % (11/13 0426) Weight:  [71 kg (156 lb 8.4 oz)-71.4 kg (157 lb 6.5 oz)] 71.4 kg (157 lb 6.5 oz) (11/13 0426) Weight change: -0.5 kg (-1 lb 1.6 oz) Last BM Date: 12/22/11 Intake/Output from previous day: -1484 11/12 0701 - 11/13 0700 In: 363 [P.O.:360; I.V.:3] Out: 279  Intake/Output this shift: Total I/O In: 240 [P.O.:240] Out: -   PE: General:alert and oriented, no pain now Heart:S1S2 RRR Lungs:clear without rales, rhonchi or wheezes Abd:+ BS soft, non tender Ext:no edema    Lab Results:  Basename 12/24/11 0645 12/23/11 0510  WBC 3.6* 7.6  HGB 9.8* 10.6*  HCT 29.2* 30.8*  PLT 97* 116*   BMET  Basename 12/24/11 0645 12/23/11 0510  NA 140 136  K 3.8 4.3  CL 101 96  CO2 26 27  GLUCOSE 116* 148*  BUN 31* 33*  CREATININE 4.95* 5.30*  CALCIUM 8.8 8.9    Basename 12/22/11 2131 12/22/11 1502  TROPONINI <0.30 <0.30    Lab Results  Component Value Date   CHOL 225* 12/22/2011   HDL 97 12/22/2011   LDLCALC 104* 12/22/2011   TRIG 121 12/22/2011   CHOLHDL 2.3 12/22/2011   Lab Results  Component Value Date   HGBA1C 8.1* 12/22/2011     Lab Results  Component Value Date   TSH 42.28* 02/19/2006    Hepatic Function Panel  Basename 12/24/11 0645  PROT --  ALBUMIN 2.5*  AST --  ALT --  ALKPHOS --  BILITOT --  BILIDIR --  IBILI --    Basename 12/22/11 1030  CHOL 225*  Studies/Results: Nm Myocar Multi W/spect W/wall Motion / Ef  12/23/2011  *RADIOLOGY REPORT*  Clinical Data:  Chest pain and history of end-stage renal disease and hypertension.  MYOCARDIAL IMAGING WITH SPECT (REST AND PHARMACOLOGIC-STRESS) GATED LEFT VENTRICULAR WALL  MOTION STUDY LEFT VENTRICULAR EJECTION FRACTION  Technique:  Standard myocardial SPECT imaging was performed after resting intravenous injection of 10 mCi Tc-21m sestamibi. Subsequently, intravenous infusion of Lexiscan was performed under the supervision of the Cardiology staff.  At peak effect of the drug, 30 mCi Tc-19m sestamibi was injected intravenously and standard myocardial SPECT  imaging was performed.  Quantitative gated imaging was also performed to evaluate left ventricular wall motion, and estimate left ventricular ejection fraction.  Comparison:  None  Findings: Utilizing gated data, the end-diastolic volume is estimated to be 106 ml and the end-systolic volume 41 ml. Calculated ejection fraction is 62%.  Gated wall motion analysis is within normal limits.  SPECT imaging shows mild attenuation of the distal anterior wall which is slightly more prominent on the stress acquisition.  A component of subtle ischemia cannot be excluded.  However, in this female patient, there may also be a component of breast soft tissue attenuation.  IMPRESSION:  1.  Normal left ventricular function with quantitative ejection fraction calculation of 62%. 2.  Mild distal anterior wall attenuation which is slightly more prominent on the stress acquisition.  A component of subtle ischemia cannot be excluded.   Original Report Authenticated By: Irish Lack, M.D.     Medications: I have reviewed the patient's current medications.    [name]  aspirin EC  81 mg Oral Daily  . atorvastatin  10 mg Oral q1800  . calcium carbonate  1 tablet Oral Daily  . DULoxetine  60 mg Oral Daily  . gabapentin  300 mg Oral QHS  . insulin aspart  0-9 Units Subcutaneous TID WC  . insulin detemir  10 Units Subcutaneous QHS  . levothyroxine  200 mcg Oral Daily  . lisinopril  10 mg Oral QHS  . metoprolol succinate  50 mg Oral BID  . pantoprazole  40 mg Oral Q0600  . paricalcitol  3 mcg Intravenous 3 times weekly  . polyethylene glycol  17 g  Oral Daily  . sodium chloride  3 mL Intravenous Q12H  . tacrolimus  5 mg Oral BID  . [DISCONTINUED] insulin aspart  0-9 Units Subcutaneous Q4H  . [DISCONTINUED] lisinopril  20 mg Oral QHS   Assessment/Plan: Principal Problem:  *Chest pain Active Problems:  HEPATITIS C  DIABETES MELLITUS, TYPE II  ANEMIA, CHRONIC  HYPERTENSION  End stage renal disease dialysis t/r/sat  S/P liver transplant  EKG abnormalities  PLAN: Dr. Allyson Sabal reviewed the nuc study and felt breast attenuation was cause of ? Ischemia.  Pt with episode of chest pain last pm. With recurrent chest pain and her multiple risk factors, cardiac cath will be needed.  Plan for today have discussed with Dr. Allyson Sabal.  LOS: 2 days   Finley Dinkel R 12/24/2011, 10:49 AM

## 2011-12-24 NOTE — Progress Notes (Signed)
PROGRESS NOTE  [name] ZOX:[id number] DOB: [birth year] DOA: 12/22/2011 PCP: [name], MD Sees Dr. George Hugh c Endocrinology  Brief narrative: 56 yr old AAF admitted 11/1  Past medical history-As per Problem list Chart reviewed as below-  Admission 04/09/11 for HCAP  Admission 03/10/11 for New Hemodialysis access-A-V graft L Thigh  Admit 12/23/10 for removal L Forearm AVG  Consultants:  Cardiology-Dr. Allyson Sabal  Nephrology-dr. Caryn Section  Procedures:  Myoview 12/23/11=1. Normal left ventricular function with quantitative ejection fraction calculation of 62%. 2. Mild distal anterior wall attenuation which is slightly more prominent on the stress acquisition. A component of subtle ischemia cannot be excluded.   CT abd [pelvis c/o contrast 12/22/11=body wall edema-?collapse consolidation R lower lung lobe  Antibiotics:  None currently   Subjective  Had some CP last pm.  Now completely resolved.  States that this was after dialysis.  States that  This weas similar to her pain at admission but is confounded by the fact that she  Had a cough as well at the time Hungry-no n/v/cp/sob   Objective    Interim History: Nursing reports no new issues  Telemetry: Sinus  Objective: Filed Vitals:   12/23/11 1843 12/23/11 1934 12/24/11 0426 12/24/11 1041  BP: 125/45 128/49 125/55 173/81  Pulse: 79 73 74 66  Temp:  98.6 F (37 C) 98.4 F (36.9 C)   TempSrc:  Oral Oral   Resp: 18  18   Height:      Weight:   71.4 kg (157 lb 6.5 oz)   SpO2: 99% 100% 100%     Intake/Output Summary (Last 24 hours) at 12/24/11 1334 Last data filed at 12/24/11 0900  Gross per 24 hour  Intake    243 ml  Output    279 ml  Net    -36 ml    Exam:  General: Alert and pleasant African American female looking older than stated age Cardiovascular: S1-S2 no murmur rub or gallop Respiratory: Clinically clear Abdomen: Soft nontender nondistended Skin scar in left forearm Neuro grossly  intact  Data Reviewed: Basic Metabolic Panel:  Lab 12/24/11 8119 12/23/11 0510 12/22/11 1030  NA 140 136 133*134*  K 3.8 4.3 --  CL 101 96 92*92*  CO2 26 27 2222  GLUCOSE 116* 148* 114*113*  BUN 31* 33* 62*62*  CREATININE 4.95* 5.30* 8.68*8.71*  CALCIUM 8.8 8.9 8.98.8  MG -- -- --  PHOS 5.1* 6.7* 8.3*   Liver Function Tests:  Lab 12/24/11 0645 12/23/11 0510 12/22/11 1030  AST -- -- --  ALT -- -- --  ALKPHOS -- -- --  BILITOT -- -- --  PROT -- -- --  ALBUMIN 2.5* 2.5* 2.8*   No results found for this basename: LIPASE:5,AMYLASE:5 in the last 168 hours No results found for this basename: AMMONIA:5 in the last 168 hours CBC:  Lab 12/24/11 0645 12/23/11 0510 12/22/11 1030  WBC 3.6* 7.6 5.7  NEUTROABS -- -- --  HGB 9.8* 10.6* 11.0*  HCT 29.2* 30.8* 32.4*  MCV 92.1 92.2 91.5  PLT 97* 116* 111*   Cardiac Enzymes:  Lab 12/22/11 2131 12/22/11 1502 12/22/11 1030  CKTOTAL -- -- --  CKMB -- -- --  CKMBINDEX -- -- --  TROPONINI <0.30 <0.30 <0.30   BNP: No components found with this basename: POCBNP:5 CBG:  Lab 12/24/11 1138 12/24/11 0614 12/24/11 0430 12/24/11 0021 12/23/11 2048  GLUCAP 144* 75 85 136* 175*    No results found for this or any previous visit (from  the past 240 hour(s)).   Studies:              All Imaging reviewed and is as per above notation   Scheduled Meds:   . aspirin  324 mg Oral Pre-Cath  . aspirin EC  81 mg Oral Daily  . atorvastatin  10 mg Oral q1800  . calcium carbonate  1 tablet Oral Daily  . diazepam  2 mg Oral On Call  . DULoxetine  60 mg Oral Daily  . gabapentin  300 mg Oral QHS  . insulin aspart  0-9 Units Subcutaneous TID WC  . insulin aspart  0-9 Units Subcutaneous Q4H  . insulin detemir  10 Units Subcutaneous QHS  . levothyroxine  200 mcg Oral Daily  . lisinopril  10 mg Oral QHS  . metoprolol succinate  50 mg Oral BID  . pantoprazole  40 mg Oral Q0600  . paricalcitol  3 mcg Intravenous 3 times weekly  .  polyethylene glycol  17 g Oral Daily  . sodium chloride  3 mL Intravenous Q12H  . tacrolimus  5 mg Oral BID  . [DISCONTINUED] insulin aspart  0-9 Units Subcutaneous Q4H  . [DISCONTINUED] lisinopril  20 mg Oral QHS  . [DISCONTINUED] sodium chloride  3 mL Intravenous Q12H   Continuous Infusions:   . sodium chloride    . [DISCONTINUED] heparin Stopped (12/24/11 1033)     Assessment/Plan: 1. Chest pain, TIMI score ~3: Patient presented with recurrent retrosternal chest pain since 12/20/11. 12-lead EKG showed old anterior Q waves, ant T inversion in leads 1 and AVL. Cardiac enzymes negative-Myoview showed some concerns-cardiology's opinion was that patient would benefit from cardiac catheterization to late rest whether this is an acute coronary syndrome or not. 2. HTN: Patient appears to have difficult-to-control HTN, despite pre-admission ACE-i and beta-blocker. Continue lisinopril 10 mg daily, metoprolol 50 mg twice a day, hydralazine 10 mg IV every 6 when necessary-will consider addition of either hydralazine scheduled by mouth versus addition of ARB to an ACE inhibitor-Nephrologist to make this adjustment if thought reasonable--would hold antihypertensive agents on dialysis days 3. DM-2: A1c 8.1 This is insulin-dependent type 2, and appears uncontrolled, based on random blood glucose. Managing with diet, Levemir and SSI-blood sugars 85-144 4. History of liver transplant secondary to hepatitis C in 2000 and-continue Prograf-note that this will lower her resistance to infection overall 5. ESRD: On HD, T-T-S. Have consulted renal team, to reinstate dialysis-Dr. Caryn Section is her regular nephrologist 6. Metabolic bone disease-continue medications as per nephrologist 7. Anemia of renal disease-monitor as an outpatient-consider supplementation with either area or in her estrogenic factors if hemoglobin drops below 10 8. Hypothyroidism: Continued on thyroxine replacement therapy. Repeat TSH in weeks as an  out-pt 9. GERD: Asymptomatic, although may be the culprit for chest pain,if CAD ruled out. Managing with PPI.    Code Status: Full Family Communication: Updated daughter at bedside.  She will d/w HCPOA Disposition Plan: PEr Cardiology/Nephrology   Pleas Koch, MD  [facility] Pager [phone] 12/24/2011, 1:34 PM    LOS: 2 days

## 2011-12-24 NOTE — Progress Notes (Signed)
[facility]  Subjective:  Feels well now.  Say "I thought I was having a heart attack yesterday with that stress test."--chest pain and SOB.  Report says there were T wave inversions II, III, AVF, and lateral leads during stress test.   Objective: Vital signs in last 24 hours: Blood pressure 173/81, pulse 66, temperature 98.4 F (36.9 C), temperature source Oral, resp. rate 18, height 5\' 3"  (1.6 m), weight 71.4 kg (157 lb 6.5 oz), SpO2 100.00%.    PHYSICAL EXAM General--awake, alert Chest--clear Heart--no rub Abd--noontender Extr--no edema, AVG patent L LE  Lab Results:   Lab 12/24/11 0645 12/23/11 0510 12/22/11 1030  NA 140 136 133*134*  K 3.8 4.3 4.74.6  CL 101 96 92*92*  CO2 26 27 2222  BUN 31* 33* 62*62*  CREATININE 4.95* 5.30* 8.68*8.71*  ALB -- -- --  GLUCOSE 116* -- --  CALCIUM 8.8 8.9 8.98.8  PHOS 5.1* 6.7* 8.3*     Basename 12/24/11 0645 12/23/11 0510  WBC 3.6* 7.6  HGB 9.8* 10.6*  HCT 29.2* 30.8*  PLT 97* 116*   I have reviewed the patient's current medications.   Assessment/Plan:  1. Chest pressure/dyspnea - Hx CAD; cardiac enzymes negative.  Cardiology plans cath today 2. ESRD - HD on TTS in [place]; K 3.8. HD again tomorrow on regular schedule. 3. Hypertension/volume - BP now 173/81 on Metoprolol 50 mg bid (hold pre-HD)with Lisinopril 10 mg qhs; wt 68.8 kg s/p net UF 1.9 L yesterday (EDW 67 kg). Maintain EDW. 4. Anemia - Hgb 10.6, no outpatient meds. 5. Metabolic bone disease - Ca 8.8 (10.1 corrected), P 5.1 on Zemplar 3 mcg, Sensipar 30 mg, Phoslyra with meals. 6. Nutrition - Alb 2.5.  7. Hx liver transplant - 2011 in [place], secondary to hepatitis C; on Prograf 5 mg bid. 8. DM - on Levemir. 9. Hx Crohn's disease       LOS: 2 days   Terisa Belardo F 12/24/2011,11:06 AM   .labalb

## 2011-12-24 NOTE — Op Note (Signed)
[name] is a 56 y.o. female    [id number] LOCATION:  FACILITY: [facility]  PHYSICIAN: Nanetta Batty, M.D. [birth year]   DATE OF PROCEDURE:  12/24/2011  DATE OF DISCHARGE:  [facility]  CARDIAC CATHETERIZATION     History obtained from chart review. [name] is a 56 year old African American female admitted with chest pain after hemodialysis. She has positive cardiac risk factors. She by the stress test that was notable for breast attenuation artifact. The initial plan was for medical therapy however because of recurrent chest pain it was decided to proceed with diagnostic coronary angiography to define her anatomy and rule out an ischemic etiology.   PROCEDURE DESCRIPTION:    The patient was brought to the second floor  [place] Cardiac cath lab in the postabsorptive state. She was  premedicated with Valium 5 mg by mouth, IV Versed and fentanyl. Her right groinwas prepped and shaved in usual sterile fashion. Xylocaine 1% was used  for local anesthesia. A 5 French sheath was inserted into the the common femoral artery using standard Seldinger technique.  5 French right and left Judkins diagnostic catheters along with a 5 French pigtail catheter used for selective coronary angiography and left ventriculography respectively. Visipaque dye was used for the entirety of the case (55 cc administered the patient), retrograde aortic left ventriculogram pullback pressures were recorded.   HEMODYNAMICS:    AO SYSTOLIC/AO DIASTOLIC: 140/68   LV SYSTOLIC/LV DIASTOLIC: 137/13  ANGIOGRAPHIC RESULTS:   1. Left main; normal  2. LAD; normal 3. Left circumflex; normal.  4. Right coronary artery; dominant and normal 5. Left ventriculography; RAO left ventriculogram was performed using  25 mL of Visipaque dye at 12 mL/second. The overall LVEF estimated  60 % Without wall motion abnormalities  IMPRESSION:[name] has essentially normal coronary arteries and  normal left function. I will this is noncardiac. Medical therapy will be recommended. The right common femoral artery puncture site was sealed with a "MYNX " closure device with good hemostasis basis except for some mild oozing requiring lidocaine with epi. She left the Cath Lab in stable condition initially dialyzed tomorrow at which time she'll be discharged home.  [name] MD, [facility] 12/24/2011 3:38 PM

## 2011-12-24 NOTE — Progress Notes (Signed)
ANTICOAGULATION CONSULT NOTE - Follow Up Consult  Pharmacy Consult for heparin Indication: chest pain/ACS  Labs:  Basename 12/24/11 0645 12/23/11 0510 12/22/11 2131 12/22/11 1502 12/22/11 1030  HGB 9.8* 10.6* -- -- --  HCT 29.2* 30.8* -- -- 32.4*  PLT 97* 116* -- -- 111*  APTT -- -- -- -- --  LABPROT -- -- -- -- 12.6  INR -- -- -- -- 0.95  HEPARINUNFRC 0.36 0.27* -- 0.47 --  CREATININE 4.95* 5.30* -- -- 8.68*8.71*  CKTOTAL -- -- -- -- --  CKMB -- -- -- -- --  TROPONINI -- -- <0.30 <0.30 <0.30    Assessment: HL at goal = 0.36 No bleeding noted  Goal of Therapy:  Heparin level 0.3-0.7 units/ml   Plan:  Continue heparin gtt at 1100 units/hr  Follow up AM heparin level if continues  Okey Regal, PharmD 12/24/2011,9:12 AM

## 2011-12-25 DIAGNOSIS — B1710 Acute hepatitis C without hepatic coma: Secondary | ICD-10-CM | POA: Diagnosis not present

## 2011-12-25 DIAGNOSIS — D631 Anemia in chronic kidney disease: Secondary | ICD-10-CM | POA: Diagnosis not present

## 2011-12-25 DIAGNOSIS — N186 End stage renal disease: Secondary | ICD-10-CM | POA: Diagnosis not present

## 2011-12-25 DIAGNOSIS — I120 Hypertensive chronic kidney disease with stage 5 chronic kidney disease or end stage renal disease: Secondary | ICD-10-CM | POA: Diagnosis not present

## 2011-12-25 LAB — CBC
HCT: 28.6 % — ABNORMAL LOW (ref 36.0–46.0)
Hemoglobin: 9.5 g/dL — ABNORMAL LOW (ref 12.0–15.0)
MCHC: 33.2 g/dL (ref 30.0–36.0)

## 2011-12-25 LAB — GLUCOSE, CAPILLARY
Glucose-Capillary: 62 mg/dL — ABNORMAL LOW (ref 70–99)
Glucose-Capillary: 84 mg/dL (ref 70–99)
Glucose-Capillary: 136 mg/dL — ABNORMAL HIGH (ref 70–99)
Glucose-Capillary: 78 mg/dL (ref 70–99)

## 2011-12-25 LAB — RENAL FUNCTION PANEL
Albumin: 2.4 g/dL — ABNORMAL LOW (ref 3.5–5.2)
Calcium: 8.4 mg/dL (ref 8.4–10.5)
GFR calc Af Amer: 7 mL/min — ABNORMAL LOW (ref >90)
Glucose, Bld: 96 mg/dL (ref 70–99)
Phosphorus: 6.2 mg/dL — ABNORMAL HIGH (ref 2.3–4.6)
Sodium: 135 mEq/L (ref 135–145)

## 2011-12-25 MED ORDER — INSULIN DETEMIR 100 UNIT/ML ~~LOC~~ SOLN: 2.0000 [IU] | Freq: Every evening | SUBCUTANEOUS | Status: DC | PRN

## 2011-12-25 MED ORDER — METOPROLOL TARTRATE 25 MG PO TABS: ORAL_TABLET | ORAL | Status: DC

## 2011-12-25 MED ORDER — PARICALCITOL 5 MCG/ML IV SOLN: 3.0000 ug | INTRAVENOUS | Status: DC

## 2011-12-25 MED ORDER — PARICALCITOL 5 MCG/ML IV SOLN
INTRAVENOUS | Status: AC
  Administered 2011-12-25: 3 ug
  Filled 2011-12-25: qty 1

## 2011-12-25 MED ORDER — INSULIN DETEMIR 100 UNIT/ML ~~LOC~~ SOLN
3.0000 [IU] | Freq: Every day | SUBCUTANEOUS | Status: DC
  Filled 2011-12-25: qty 10

## 2011-12-25 NOTE — Discharge Summary (Signed)
Physician Discharge Summary  [name] ZOX:[id number] DOB: Mar 14, 1955 DOA: 12/22/2011  PCP: [name], MD  Admit date: 12/22/2011 Discharge date: 12/25/2011  Time spent: 20 minutes  Recommendations for Outpatient Follow-up:  1. Recommend close monitoring of blood sugars-keep only on Levemir not sliding scale insulin as an outpatient 2. Needs outpatient HbA1c and lipid panel in 3 months 3. Needs review of blood pressures as an outpatient by nephrologist 4. Needs further workup and management of liver transplant by [facility] physician-need to continue Prograf   Discharge Diagnoses:  Principal Problem:  *Chest pain Active Problems:  HEPATITIS C  DIABETES MELLITUS, TYPE II  ANEMIA, CHRONIC  HYPERTENSION  End stage renal disease dialysis t/r/sat  S/P liver transplant  EKG abnormalities   Discharge Condition: Stable  Diet recommendation: Renal  Filed Weights   12/24/11 0426 12/25/11 0335 12/25/11 0824  Weight: 71.4 kg (157 lb 6.5 oz) 73.2 kg (161 lb 6 oz) 74 kg (163 lb 2.3 oz)    History of present illness:  56 yo female h/o esrd dialysis t/r/sat, s/p liver transplant 2011, hep c, htn, dm comes in from transfer from outside facility for sscp without radiation with associated mild sob no n/v. No fevers. No le edema. Cp was relieved in ED on arrival about 100p earlier today and had ntg paste 1 inch put on about 130p now is having sscp again. No prior h/o cad. Stress testing was done years ago.  Patient was noted to have T wave inversions in lead 1 and aVL and troponins were negative. Cardiology saw the patient and elected to do a cardiac catheterization given persistence of chest pain. Please see below for full details   Hospital Course:  1. Chest pain, TIMI score ~3: Patient presented with recurrent retrosternal chest pain since 12/20/11. 12-lead EKG showed old anterior Q waves, ant T inversion in leads 1 and AVL. Cardiac enzymes negative-Myoview showed some concerns-cardiac cath  done as above was negative. Medical management. Lisinopril not prescribed on discharge given patient had mild bradycardia/hypotension and patient was not able to reach goal weight. Beta blocker prescribed for outpatient therapy. 2. HTN: Patient appears to have difficult-to-control HTN, despite pre-admission ACE-i and beta-blocker. Continue metoprolol 50 mg twice a day on regular days [nondialysis days], -would hold antihypertensive agents on dialysis days 3. DM-2: A1c 8.1 This is insulin-dependent type 2, and appears uncontrolled-her blood sugar dropped to 60 on day of discharge and she was taken off of all short-term short acting insulin and kept only on her Levemir, this is probably because of altered pharmacokinetics of insulin given her renal failure. She will need to have this closely monitored and have told her to be better tolerate high blood sugars rather than low blood sugar says that would harm her more than low blood sugars 4. History of liver transplant secondary to hepatitis C in 2000 and-continue Prograf-note that this will lower her resistance to infection overall 5. ESRD: On HD, T-T-S. Have consulted renal team, to reinstate dialysis-Dr. Caryn Section is her regular nephrologist 6. Metabolic bone disease-continue medications as per nephrologist 7. Anemia of renal disease-monitor as an outpatient-consider supplementation with either area or in her estrogenic factors if hemoglobin drops below 10 8. Hypothyroidism: Continued on thyroxine replacement therapy. Repeat TSH in weeks as an out-pt 9. GERD: Asymptomatic, although may be the culprit for chest pain,if CAD ruled out. Managing with PPI.   Past medical history-As per Problem list  Chart reviewed as below-  Admission 04/09/11 for HCAP  Admission 03/10/11  for New Hemodialysis access-A-V graft L Thigh  Admit 12/23/10 for removal L Forearm AVG  Consultants:  Cardiology-Dr. Allyson Sabal  Nephrology-dr. Caryn Section  Procedures:  Myoview 12/23/11=1. Normal left  ventricular function with quantitative ejection fraction calculation of 62%. 2. Mild distal anterior wall attenuation which is slightly more prominent on the stress acquisition. A component of subtle ischemia cannot be excluded.  CT abd [pelvis c/o contrast 12/22/11=body wall edema-?collapse consolidation R lower lung lobe Cardiac catheterization 12/24/2011 = normal coronaries normal left ventricular function recommended medical therapy  Antibiotics: None   Discharge Exam: Filed Vitals:   12/25/11 1030 12/25/11 1100 12/25/11 1130 12/25/11 1200  BP: 112/65 101/67 118/69 99/60  Pulse: 69 67 70 76  Temp:      TempSrc:      Resp: 16 16 17 20   Height:      Weight:      SpO2:        Seen on dialysis-doing well, no acute issues at present. Worried about her blood pressure. Chest pain or shortness of breath no blurred vision no double vision abdominal pain, 8 full meal this morning.  General: None Cardiovascular: S1-S2 no murmur rub or gallop Respiratory: Clinically clear  Discharge Instructions  Discharge Orders    Future Orders Please Complete By Expires   Diet - low sodium heart healthy      Increase activity slowly      Call MD for:  temperature >100.4      Call MD for:  severe uncontrolled pain      Call MD for:  difficulty breathing, headache or visual disturbances      Call MD for:  hives      Call MD for:  persistant dizziness or light-headedness      Call MD for:  extreme fatigue          Medication List     As of 12/25/2011 12:16 PM    STOP taking these medications         ondansetron 4 MG disintegrating tablet   Commonly known as: ZOFRAN-ODT      TAKE these medications         albuterol 90 MCG/ACT inhaler   Commonly known as: PROVENTIL,VENTOLIN   Inhale 2 puffs into the lungs every 4 (four) hours as needed. For shortness of breath      aspirin EC 81 MG tablet   Take 81 mg by mouth daily.      calcium carbonate 500 MG chewable tablet   Commonly known  as: TUMS - dosed in mg elemental calcium   Chew 1 tablet by mouth daily.      docusate sodium 100 MG capsule   Commonly known as: COLACE   Take 100 mg by mouth 2 (two) times daily.      DULoxetine 60 MG capsule   Commonly known as: CYMBALTA   Take 60 mg by mouth daily.      FOLIC ACID PO   Take 0.4 mg by mouth daily.      gabapentin 600 MG tablet   Commonly known as: NEURONTIN   Take 600 mg by mouth at bedtime.      insulin detemir 100 UNIT/ML injection   Commonly known as: LEVEMIR   Inject 2 Units into the skin at bedtime as needed. Uses only if blood sugar levels are over 150      levothyroxine 175 MCG tablet   Commonly known as: SYNTHROID, LEVOTHROID   Take 175 mcg by mouth daily.  metoprolol tartrate 25 MG tablet   Commonly known as: LOPRESSOR   Take on NON Dialysis days      omeprazole 20 MG capsule   Commonly known as: PRILOSEC   Take 20 mg by mouth daily.      polyethylene glycol packet   Commonly known as: MIRALAX / GLYCOLAX   Take 17 g by mouth 2 (two) times daily. For constipation      promethazine 12.5 MG tablet   Commonly known as: PHENERGAN   Take 12.5 mg by mouth every 6 (six) hours as needed. For nausea      senna 8.6 MG tablet   Commonly known as: SENOKOT   Take 1 tablet by mouth every evening.      sucralfate 1 G tablet   Commonly known as: CARAFATE   Take 1 g by mouth 2 (two) times daily.      tacrolimus 1 MG capsule   Commonly known as: PROGRAF   Take 5 mg by mouth 2 (two) times daily.      traMADol 50 MG tablet   Commonly known as: ULTRAM   Take 50 mg by mouth every 6 (six) hours as needed. pain            Follow-up Information    Follow up with [name], MD.   Contact information:   [address] [place] [address] [phone]       Follow up with Zada Girt, MD. In 4 days.   Contact information:   [address]  [facility] [place] [address] [phone]           The results of  significant diagnostics from this hospitalization (including imaging, microbiology, ancillary and laboratory) are listed below for reference.    Significant Diagnostic Studies: Nm Myocar Multi W/spect W/wall Motion / Ef  12/23/2011  *RADIOLOGY REPORT*  Clinical Data:  Chest pain and history of end-stage renal disease and hypertension.  MYOCARDIAL IMAGING WITH SPECT (REST AND PHARMACOLOGIC-STRESS) GATED LEFT VENTRICULAR WALL MOTION STUDY LEFT VENTRICULAR EJECTION FRACTION  Technique:  Standard myocardial SPECT imaging was performed after resting intravenous injection of 10 mCi Tc-16m sestamibi. Subsequently, intravenous infusion of Lexiscan was performed under the supervision of the Cardiology staff.  At peak effect of the drug, 30 mCi Tc-28m sestamibi was injected intravenously and standard myocardial SPECT  imaging was performed.  Quantitative gated imaging was also performed to evaluate left ventricular wall motion, and estimate left ventricular ejection fraction.  Comparison:  None  Findings: Utilizing gated data, the end-diastolic volume is estimated to be 106 ml and the end-systolic volume 41 ml. Calculated ejection fraction is 62%.  Gated wall motion analysis is within normal limits.  SPECT imaging shows mild attenuation of the distal anterior wall which is slightly more prominent on the stress acquisition.  A component of subtle ischemia cannot be excluded.  However, in this female patient, there may also be a component of breast soft tissue attenuation.  IMPRESSION:  1.  Normal left ventricular function with quantitative ejection fraction calculation of 62%. 2.  Mild distal anterior wall attenuation which is slightly more prominent on the stress acquisition.  A component of subtle ischemia cannot be excluded.   Original Report Authenticated By: Irish Lack, M.D.     Microbiology: No results found for this or any previous visit (from the past 240 hour(s)).   Labs: Basic Metabolic Panel:  Lab  12/25/11 0523 12/24/11 0645 12/23/11 0510 12/22/11 1030  NA 135 140 136  161*096*  K 4.0 3.8 4.3 4.74.6  CL 98 101 96 92*92*  CO2 25 26 27  2222  GLUCOSE 96 116* 148* 114*113*  BUN 44* 31* 33* 62*62*  CREATININE 6.72* 4.95* 5.30* 8.68*8.71*  CALCIUM 8.4 8.8 8.9 8.98.8  MG -- -- -- --  PHOS 6.2* 5.1* 6.7* 8.3*   Liver Function Tests:  Lab 12/25/11 0523 12/24/11 0645 12/23/11 0510 12/22/11 1030  AST -- -- -- --  ALT -- -- -- --  ALKPHOS -- -- -- --  BILITOT -- -- -- --  PROT -- -- -- --  ALBUMIN 2.4* 2.5* 2.5* 2.8*   No results found for this basename: LIPASE:5,AMYLASE:5 in the last 168 hours No results found for this basename: AMMONIA:5 in the last 168 hours CBC:  Lab 12/25/11 0523 12/24/11 0645 12/23/11 0510 12/22/11 1030  WBC 4.1 3.6* 7.6 5.7  NEUTROABS -- -- -- --  HGB 9.5* 9.8* 10.6* 11.0*  HCT 28.6* 29.2* 30.8* 32.4*  MCV 93.8 92.1 92.2 91.5  PLT 96* 97* 116* 111*   Cardiac Enzymes:  Lab 12/22/11 2131 12/22/11 1502 12/22/11 1030  CKTOTAL -- -- --  CKMB -- -- --  CKMBINDEX -- -- --  TROPONINI <0.30 <0.30 <0.30   BNP: BNP (last 3 results) No results found for this basename: PROBNP:3 in the last 8760 hours CBG:  Lab 12/25/11 0756 12/25/11 0451 12/25/11 0349 12/24/11 2356 12/24/11 2042  GLUCAP 136* 84 62* 220* 185*       Signed:  Rhetta Mura  [facility] 12/25/2011, 12:07 PM

## 2011-12-25 NOTE — Progress Notes (Signed)
Subjective:   Seen on HD, no current complaints, breathing well without any recent chest pain, but vomiting x 1 early this AM secondary to blood glucose 62.  Objective: Vital signs in last 24 hours: Temp:  [97.8 F (36.6 C)-98.3 F (36.8 C)] 97.9 F (36.6 C) (11/14 0824) Pulse Rate:  [64-75] 69  (11/14 0833) Resp:  [18-20] 19  (11/14 0824) BP: (134-177)/(56-109) 143/70 mmHg (11/14 0833) SpO2:  [99 %-100 %] 100 % (11/14 0833) Weight:  [73.2 kg (161 lb 6 oz)-74 kg (163 lb 2.3 oz)] 74 kg (163 lb 2.3 oz) (11/14 0824) Weight change: 2 kg (4 lb 6.6 oz)  Intake/Output from previous day: 11/13 0701 - 11/14 0700 In: 480 [P.O.:480] Out: -    EXAM: General appearance: Alert, in no apparent distress Resp:  CTA without rales, rhonchi, or wheezes Cardio:  RRR without murmur or rub GI:  + BS, soft and nontender Extremities:  No edema  Access:  Left thigh AVG with BFR 400 cc/min   Lab Results:  Basename 12/25/11 0523 12/24/11 0645  WBC 4.1 3.6*  HGB 9.5* 9.8*  HCT 28.6* 29.2*  PLT 96* 97*   BMET:  Basename 12/25/11 0523 12/24/11 0645  NA 135 140  K 4.0 3.8  CL 98 101  CO2 25 26  GLUCOSE 96 116*  BUN 44* 31*  CREATININE 6.72* 4.95*  CALCIUM 8.4 8.8  ALBUMIN 2.4* 2.5*   No results found for this basename: PTH:2 in the last 72 hours Iron Studies: No results found for this basename: IRON,TIBC,TRANSFERRIN,FERRITIN in the last 72 hours  Dialysis Orders: Center: [facility] on TTS.  EDW 67 kg  HD Bath 2K/2.25Ca Time 3.5 hrs Heparin 0. Access Left thigh AVG BFR 400 DFR 800  Zemplar 3 mcg IV/HD Epogen 0 Units IV/HD Venofer 0.  Assessment/Plan: 1. Chest pressure/dyspnea - Hx CAD; cardiac enzymes negative; concern for ischemia per Myoview 11/12, but cardiac cath yesterday with normal coronary arteries and normal left ventricular function; symptoms began post-HD on 11/9; much better since HD on 11/11.  2. ESRD - HD on TTS in ; K 4 pre-HD. 3. Hypertension/volume - BP now 104/63 on  Metoprolol 25 mg bid (hold pre-HD) as outpatient, but 50 mg bid, here with Lisinopril 10 mg qhs; wt 74 kg today (EDW 67 kg).  DC Lisinopril, check post-HD wt.  4. Anemia - Hgb 9.5, no outpatient meds.  5. Metabolic bone disease - Ca 8.4 (9.7 corrected), P 6.2 on Zemplar 3 mcg, Sensipar 30 mg, Phoslyra with meals.  6. Nutrition - Alb 2.4.  7. Hx liver transplant - 2011 in [place], secondary to hepatitis C; on Prograf 5 mg bid.  8. DM - low BG of 62 this AM, improved to 136; on Levemir.  9. Hx Crohn's disease     LOS: 3 days   LYLES,CHARLES 12/25/2011,8:36 AM Currently on dilaysis via L thigh AVG.  Her weight has increased since admission (I think EDW needs to be raised.  Will d/c lisinopril and continue on metoprolol.  Fortunately cath showed normal coronary aa.  OK for d/c when ok with Dr. Mahala Menghini.

## 2012-01-05 DIAGNOSIS — B1910 Unspecified viral hepatitis B without hepatic coma: Secondary | ICD-10-CM | POA: Diagnosis not present

## 2012-01-05 DIAGNOSIS — E119 Type 2 diabetes mellitus without complications: Secondary | ICD-10-CM | POA: Diagnosis not present

## 2012-01-05 DIAGNOSIS — I10 Essential (primary) hypertension: Secondary | ICD-10-CM | POA: Diagnosis not present

## 2012-01-05 DIAGNOSIS — N186 End stage renal disease: Secondary | ICD-10-CM | POA: Diagnosis not present

## 2012-01-05 DIAGNOSIS — Z79899 Other long term (current) drug therapy: Secondary | ICD-10-CM | POA: Diagnosis not present

## 2012-01-10 DIAGNOSIS — N186 End stage renal disease: Secondary | ICD-10-CM | POA: Diagnosis not present

## 2012-01-13 DIAGNOSIS — N2581 Secondary hyperparathyroidism of renal origin: Secondary | ICD-10-CM | POA: Diagnosis not present

## 2012-01-13 DIAGNOSIS — N186 End stage renal disease: Secondary | ICD-10-CM | POA: Diagnosis not present

## 2012-01-13 DIAGNOSIS — D631 Anemia in chronic kidney disease: Secondary | ICD-10-CM | POA: Diagnosis not present

## 2012-01-16 DIAGNOSIS — I120 Hypertensive chronic kidney disease with stage 5 chronic kidney disease or end stage renal disease: Secondary | ICD-10-CM | POA: Diagnosis not present

## 2012-01-16 DIAGNOSIS — Z944 Liver transplant status: Secondary | ICD-10-CM | POA: Diagnosis not present

## 2012-01-16 DIAGNOSIS — N185 Chronic kidney disease, stage 5: Secondary | ICD-10-CM | POA: Diagnosis not present

## 2012-01-16 DIAGNOSIS — I10 Essential (primary) hypertension: Secondary | ICD-10-CM | POA: Diagnosis not present

## 2012-01-29 DIAGNOSIS — R109 Unspecified abdominal pain: Secondary | ICD-10-CM | POA: Diagnosis not present

## 2012-01-29 DIAGNOSIS — R112 Nausea with vomiting, unspecified: Secondary | ICD-10-CM | POA: Diagnosis not present

## 2012-01-29 DIAGNOSIS — R079 Chest pain, unspecified: Secondary | ICD-10-CM | POA: Diagnosis not present

## 2012-01-29 DIAGNOSIS — R1084 Generalized abdominal pain: Secondary | ICD-10-CM | POA: Diagnosis not present

## 2012-01-29 DIAGNOSIS — R10819 Abdominal tenderness, unspecified site: Secondary | ICD-10-CM | POA: Diagnosis not present

## 2012-01-29 DIAGNOSIS — R9431 Abnormal electrocardiogram [ECG] [EKG]: Secondary | ICD-10-CM | POA: Diagnosis not present

## 2012-01-29 DIAGNOSIS — R110 Nausea: Secondary | ICD-10-CM | POA: Diagnosis not present

## 2012-01-29 DIAGNOSIS — K2990 Gastroduodenitis, unspecified, without bleeding: Secondary | ICD-10-CM | POA: Diagnosis not present

## 2012-02-05 DIAGNOSIS — D631 Anemia in chronic kidney disease: Secondary | ICD-10-CM | POA: Diagnosis not present

## 2012-02-05 DIAGNOSIS — N2581 Secondary hyperparathyroidism of renal origin: Secondary | ICD-10-CM | POA: Diagnosis not present

## 2012-02-05 DIAGNOSIS — N186 End stage renal disease: Secondary | ICD-10-CM | POA: Diagnosis not present

## 2012-02-05 DIAGNOSIS — N039 Chronic nephritic syndrome with unspecified morphologic changes: Secondary | ICD-10-CM | POA: Diagnosis not present

## 2012-02-10 DIAGNOSIS — N186 End stage renal disease: Secondary | ICD-10-CM | POA: Diagnosis not present

## 2012-02-12 DIAGNOSIS — D631 Anemia in chronic kidney disease: Secondary | ICD-10-CM | POA: Diagnosis not present

## 2012-02-12 DIAGNOSIS — N186 End stage renal disease: Secondary | ICD-10-CM | POA: Diagnosis not present

## 2012-02-12 DIAGNOSIS — D509 Iron deficiency anemia, unspecified: Secondary | ICD-10-CM | POA: Diagnosis not present

## 2012-02-12 DIAGNOSIS — N2581 Secondary hyperparathyroidism of renal origin: Secondary | ICD-10-CM | POA: Diagnosis not present

## 2012-03-04 DIAGNOSIS — Z944 Liver transplant status: Secondary | ICD-10-CM | POA: Diagnosis not present

## 2012-03-04 DIAGNOSIS — E1165 Type 2 diabetes mellitus with hyperglycemia: Secondary | ICD-10-CM | POA: Diagnosis not present

## 2012-03-04 DIAGNOSIS — E118 Type 2 diabetes mellitus with unspecified complications: Secondary | ICD-10-CM | POA: Diagnosis not present

## 2012-03-12 DIAGNOSIS — N186 End stage renal disease: Secondary | ICD-10-CM | POA: Diagnosis not present

## 2012-03-13 DIAGNOSIS — N2581 Secondary hyperparathyroidism of renal origin: Secondary | ICD-10-CM | POA: Diagnosis not present

## 2012-03-13 DIAGNOSIS — Z23 Encounter for immunization: Secondary | ICD-10-CM | POA: Diagnosis not present

## 2012-03-13 DIAGNOSIS — N186 End stage renal disease: Secondary | ICD-10-CM | POA: Diagnosis not present

## 2012-03-13 DIAGNOSIS — D631 Anemia in chronic kidney disease: Secondary | ICD-10-CM | POA: Diagnosis not present

## 2012-03-13 DIAGNOSIS — D509 Iron deficiency anemia, unspecified: Secondary | ICD-10-CM | POA: Diagnosis not present

## 2012-04-09 DIAGNOSIS — N186 End stage renal disease: Secondary | ICD-10-CM | POA: Diagnosis not present

## 2012-04-10 DIAGNOSIS — D509 Iron deficiency anemia, unspecified: Secondary | ICD-10-CM | POA: Diagnosis not present

## 2012-04-10 DIAGNOSIS — N2581 Secondary hyperparathyroidism of renal origin: Secondary | ICD-10-CM | POA: Diagnosis not present

## 2012-04-10 DIAGNOSIS — D631 Anemia in chronic kidney disease: Secondary | ICD-10-CM | POA: Diagnosis not present

## 2012-04-10 DIAGNOSIS — E1165 Type 2 diabetes mellitus with hyperglycemia: Secondary | ICD-10-CM | POA: Diagnosis not present

## 2012-04-10 DIAGNOSIS — N186 End stage renal disease: Secondary | ICD-10-CM | POA: Diagnosis not present

## 2012-04-12 DIAGNOSIS — B1910 Unspecified viral hepatitis B without hepatic coma: Secondary | ICD-10-CM | POA: Diagnosis not present

## 2012-04-12 DIAGNOSIS — Z79899 Other long term (current) drug therapy: Secondary | ICD-10-CM | POA: Diagnosis not present

## 2012-04-26 DIAGNOSIS — Z136 Encounter for screening for cardiovascular disorders: Secondary | ICD-10-CM | POA: Diagnosis not present

## 2012-04-26 DIAGNOSIS — Z0000 Encounter for general adult medical examination without abnormal findings: Secondary | ICD-10-CM | POA: Diagnosis not present

## 2012-04-26 DIAGNOSIS — Z01419 Encounter for gynecological examination (general) (routine) without abnormal findings: Secondary | ICD-10-CM | POA: Diagnosis not present

## 2012-05-10 DIAGNOSIS — N186 End stage renal disease: Secondary | ICD-10-CM | POA: Diagnosis not present

## 2012-05-11 DIAGNOSIS — R079 Chest pain, unspecified: Secondary | ICD-10-CM | POA: Diagnosis not present

## 2012-05-11 DIAGNOSIS — I2510 Atherosclerotic heart disease of native coronary artery without angina pectoris: Secondary | ICD-10-CM | POA: Diagnosis not present

## 2012-05-11 DIAGNOSIS — N186 End stage renal disease: Secondary | ICD-10-CM | POA: Diagnosis not present

## 2012-05-11 DIAGNOSIS — R0602 Shortness of breath: Secondary | ICD-10-CM | POA: Diagnosis not present

## 2012-05-11 DIAGNOSIS — D631 Anemia in chronic kidney disease: Secondary | ICD-10-CM | POA: Diagnosis not present

## 2012-05-11 DIAGNOSIS — R51 Headache: Secondary | ICD-10-CM | POA: Diagnosis not present

## 2012-05-11 DIAGNOSIS — K219 Gastro-esophageal reflux disease without esophagitis: Secondary | ICD-10-CM | POA: Diagnosis not present

## 2012-05-11 DIAGNOSIS — E1165 Type 2 diabetes mellitus with hyperglycemia: Secondary | ICD-10-CM | POA: Diagnosis not present

## 2012-05-11 DIAGNOSIS — Z944 Liver transplant status: Secondary | ICD-10-CM | POA: Diagnosis not present

## 2012-05-11 DIAGNOSIS — E7800 Pure hypercholesterolemia, unspecified: Secondary | ICD-10-CM | POA: Diagnosis not present

## 2012-05-11 DIAGNOSIS — F17200 Nicotine dependence, unspecified, uncomplicated: Secondary | ICD-10-CM | POA: Diagnosis not present

## 2012-05-11 DIAGNOSIS — I10 Essential (primary) hypertension: Secondary | ICD-10-CM | POA: Diagnosis not present

## 2012-05-11 DIAGNOSIS — J449 Chronic obstructive pulmonary disease, unspecified: Secondary | ICD-10-CM | POA: Diagnosis not present

## 2012-05-11 DIAGNOSIS — N19 Unspecified kidney failure: Secondary | ICD-10-CM | POA: Diagnosis not present

## 2012-05-11 DIAGNOSIS — E119 Type 2 diabetes mellitus without complications: Secondary | ICD-10-CM | POA: Diagnosis not present

## 2012-05-11 DIAGNOSIS — N2581 Secondary hyperparathyroidism of renal origin: Secondary | ICD-10-CM | POA: Diagnosis not present

## 2012-06-03 DIAGNOSIS — E1165 Type 2 diabetes mellitus with hyperglycemia: Secondary | ICD-10-CM | POA: Diagnosis not present

## 2012-06-03 DIAGNOSIS — Z944 Liver transplant status: Secondary | ICD-10-CM | POA: Diagnosis not present

## 2012-06-09 DIAGNOSIS — N186 End stage renal disease: Secondary | ICD-10-CM | POA: Diagnosis not present

## 2012-06-10 DIAGNOSIS — D631 Anemia in chronic kidney disease: Secondary | ICD-10-CM | POA: Diagnosis not present

## 2012-06-10 DIAGNOSIS — D509 Iron deficiency anemia, unspecified: Secondary | ICD-10-CM | POA: Diagnosis not present

## 2012-06-10 DIAGNOSIS — N2581 Secondary hyperparathyroidism of renal origin: Secondary | ICD-10-CM | POA: Diagnosis not present

## 2012-06-10 DIAGNOSIS — N186 End stage renal disease: Secondary | ICD-10-CM | POA: Diagnosis not present

## 2012-06-15 DIAGNOSIS — R0602 Shortness of breath: Secondary | ICD-10-CM | POA: Diagnosis not present

## 2012-06-15 DIAGNOSIS — K219 Gastro-esophageal reflux disease without esophagitis: Secondary | ICD-10-CM | POA: Diagnosis not present

## 2012-06-15 DIAGNOSIS — J449 Chronic obstructive pulmonary disease, unspecified: Secondary | ICD-10-CM | POA: Diagnosis not present

## 2012-06-15 DIAGNOSIS — R6889 Other general symptoms and signs: Secondary | ICD-10-CM | POA: Diagnosis not present

## 2012-06-15 DIAGNOSIS — J9600 Acute respiratory failure, unspecified whether with hypoxia or hypercapnia: Secondary | ICD-10-CM | POA: Diagnosis not present

## 2012-06-15 DIAGNOSIS — Z7982 Long term (current) use of aspirin: Secondary | ICD-10-CM | POA: Diagnosis not present

## 2012-06-15 DIAGNOSIS — E7800 Pure hypercholesterolemia, unspecified: Secondary | ICD-10-CM | POA: Diagnosis not present

## 2012-06-15 DIAGNOSIS — E119 Type 2 diabetes mellitus without complications: Secondary | ICD-10-CM | POA: Diagnosis not present

## 2012-06-15 DIAGNOSIS — I10 Essential (primary) hypertension: Secondary | ICD-10-CM | POA: Diagnosis not present

## 2012-06-15 DIAGNOSIS — R51 Headache: Secondary | ICD-10-CM | POA: Diagnosis not present

## 2012-06-15 DIAGNOSIS — Z79899 Other long term (current) drug therapy: Secondary | ICD-10-CM | POA: Diagnosis not present

## 2012-06-15 DIAGNOSIS — J811 Chronic pulmonary edema: Secondary | ICD-10-CM | POA: Diagnosis not present

## 2012-06-15 DIAGNOSIS — I252 Old myocardial infarction: Secondary | ICD-10-CM | POA: Diagnosis not present

## 2012-06-30 DIAGNOSIS — Z79899 Other long term (current) drug therapy: Secondary | ICD-10-CM | POA: Diagnosis not present

## 2012-06-30 DIAGNOSIS — B1910 Unspecified viral hepatitis B without hepatic coma: Secondary | ICD-10-CM | POA: Diagnosis not present

## 2012-07-10 DIAGNOSIS — N186 End stage renal disease: Secondary | ICD-10-CM | POA: Diagnosis not present

## 2012-07-13 DIAGNOSIS — N2581 Secondary hyperparathyroidism of renal origin: Secondary | ICD-10-CM | POA: Diagnosis not present

## 2012-07-13 DIAGNOSIS — D509 Iron deficiency anemia, unspecified: Secondary | ICD-10-CM | POA: Diagnosis not present

## 2012-07-13 DIAGNOSIS — D631 Anemia in chronic kidney disease: Secondary | ICD-10-CM | POA: Diagnosis not present

## 2012-07-13 DIAGNOSIS — N186 End stage renal disease: Secondary | ICD-10-CM | POA: Diagnosis not present

## 2012-07-18 DIAGNOSIS — R0602 Shortness of breath: Secondary | ICD-10-CM | POA: Diagnosis not present

## 2012-07-18 DIAGNOSIS — R0989 Other specified symptoms and signs involving the circulatory and respiratory systems: Secondary | ICD-10-CM | POA: Diagnosis not present

## 2012-07-18 DIAGNOSIS — J069 Acute upper respiratory infection, unspecified: Secondary | ICD-10-CM | POA: Diagnosis not present

## 2012-07-18 DIAGNOSIS — J440 Chronic obstructive pulmonary disease with acute lower respiratory infection: Secondary | ICD-10-CM | POA: Diagnosis not present

## 2012-07-18 DIAGNOSIS — J988 Other specified respiratory disorders: Secondary | ICD-10-CM | POA: Diagnosis not present

## 2012-07-18 DIAGNOSIS — J449 Chronic obstructive pulmonary disease, unspecified: Secondary | ICD-10-CM | POA: Diagnosis not present

## 2012-07-18 DIAGNOSIS — R0609 Other forms of dyspnea: Secondary | ICD-10-CM | POA: Diagnosis not present

## 2012-07-19 DIAGNOSIS — R0609 Other forms of dyspnea: Secondary | ICD-10-CM | POA: Diagnosis not present

## 2012-07-20 ENCOUNTER — Emergency Department (HOSPITAL_COMMUNITY): Payer: Medicare Other

## 2012-07-20 ENCOUNTER — Emergency Department (HOSPITAL_COMMUNITY)
Admission: EM | Admit: 2012-07-20 | Discharge: 2012-07-20 | Disposition: A | Discharge status: Home / Self Care | Payer: Medicare Other | Attending: Emergency Medicine | Admitting: Emergency Medicine

## 2012-07-20 ENCOUNTER — Encounter (HOSPITAL_COMMUNITY): Payer: Self-pay | Admitting: Emergency Medicine

## 2012-07-20 DIAGNOSIS — Z992 Dependence on renal dialysis: Secondary | ICD-10-CM | POA: Diagnosis not present

## 2012-07-20 DIAGNOSIS — F411 Generalized anxiety disorder: Secondary | ICD-10-CM | POA: Insufficient documentation

## 2012-07-20 DIAGNOSIS — R0602 Shortness of breath: Secondary | ICD-10-CM | POA: Diagnosis not present

## 2012-07-20 DIAGNOSIS — K5900 Constipation, unspecified: Secondary | ICD-10-CM | POA: Insufficient documentation

## 2012-07-20 DIAGNOSIS — Z8719 Personal history of other diseases of the digestive system: Secondary | ICD-10-CM | POA: Insufficient documentation

## 2012-07-20 DIAGNOSIS — K219 Gastro-esophageal reflux disease without esophagitis: Secondary | ICD-10-CM | POA: Insufficient documentation

## 2012-07-20 DIAGNOSIS — Z8739 Personal history of other diseases of the musculoskeletal system and connective tissue: Secondary | ICD-10-CM | POA: Diagnosis not present

## 2012-07-20 DIAGNOSIS — Z8619 Personal history of other infectious and parasitic diseases: Secondary | ICD-10-CM | POA: Diagnosis not present

## 2012-07-20 DIAGNOSIS — Z7982 Long term (current) use of aspirin: Secondary | ICD-10-CM | POA: Insufficient documentation

## 2012-07-20 DIAGNOSIS — R0609 Other forms of dyspnea: Secondary | ICD-10-CM | POA: Insufficient documentation

## 2012-07-20 DIAGNOSIS — Z8679 Personal history of other diseases of the circulatory system: Secondary | ICD-10-CM | POA: Insufficient documentation

## 2012-07-20 DIAGNOSIS — I120 Hypertensive chronic kidney disease with stage 5 chronic kidney disease or end stage renal disease: Secondary | ICD-10-CM | POA: Diagnosis not present

## 2012-07-20 DIAGNOSIS — E039 Hypothyroidism, unspecified: Secondary | ICD-10-CM | POA: Diagnosis not present

## 2012-07-20 DIAGNOSIS — Z8669 Personal history of other diseases of the nervous system and sense organs: Secondary | ICD-10-CM | POA: Diagnosis not present

## 2012-07-20 DIAGNOSIS — Z87448 Personal history of other diseases of urinary system: Secondary | ICD-10-CM | POA: Insufficient documentation

## 2012-07-20 DIAGNOSIS — R739 Hyperglycemia, unspecified: Secondary | ICD-10-CM

## 2012-07-20 DIAGNOSIS — E669 Obesity, unspecified: Secondary | ICD-10-CM | POA: Diagnosis not present

## 2012-07-20 DIAGNOSIS — E119 Type 2 diabetes mellitus without complications: Secondary | ICD-10-CM | POA: Diagnosis not present

## 2012-07-20 DIAGNOSIS — F329 Major depressive disorder, single episode, unspecified: Secondary | ICD-10-CM | POA: Diagnosis not present

## 2012-07-20 DIAGNOSIS — D649 Anemia, unspecified: Secondary | ICD-10-CM | POA: Diagnosis not present

## 2012-07-20 DIAGNOSIS — Z79899 Other long term (current) drug therapy: Secondary | ICD-10-CM | POA: Insufficient documentation

## 2012-07-20 DIAGNOSIS — F17200 Nicotine dependence, unspecified, uncomplicated: Secondary | ICD-10-CM | POA: Diagnosis not present

## 2012-07-20 DIAGNOSIS — E079 Disorder of thyroid, unspecified: Secondary | ICD-10-CM | POA: Insufficient documentation

## 2012-07-20 DIAGNOSIS — J45909 Unspecified asthma, uncomplicated: Secondary | ICD-10-CM | POA: Insufficient documentation

## 2012-07-20 DIAGNOSIS — Z8701 Personal history of pneumonia (recurrent): Secondary | ICD-10-CM | POA: Diagnosis not present

## 2012-07-20 DIAGNOSIS — I2510 Atherosclerotic heart disease of native coronary artery without angina pectoris: Secondary | ICD-10-CM | POA: Diagnosis not present

## 2012-07-20 DIAGNOSIS — F3289 Other specified depressive episodes: Secondary | ICD-10-CM | POA: Insufficient documentation

## 2012-07-20 DIAGNOSIS — R0600 Dyspnea, unspecified: Secondary | ICD-10-CM

## 2012-07-20 DIAGNOSIS — N186 End stage renal disease: Secondary | ICD-10-CM

## 2012-07-20 DIAGNOSIS — E1169 Type 2 diabetes mellitus with other specified complication: Secondary | ICD-10-CM | POA: Diagnosis not present

## 2012-07-20 DIAGNOSIS — R0989 Other specified symptoms and signs involving the circulatory and respiratory systems: Secondary | ICD-10-CM | POA: Insufficient documentation

## 2012-07-20 DIAGNOSIS — R079 Chest pain, unspecified: Secondary | ICD-10-CM | POA: Diagnosis not present

## 2012-07-20 LAB — COMPREHENSIVE METABOLIC PANEL
ALT: 20 U/L (ref 0–35)
AST: 31 U/L (ref 0–37)
Albumin: 3 g/dL — ABNORMAL LOW (ref 3.5–5.2)
Alkaline Phosphatase: 81 U/L (ref 39–117)
BUN: 65 mg/dL — ABNORMAL HIGH (ref 6–23)
Calcium: 10.3 mg/dL (ref 8.4–10.5)
GFR calc non Af Amer: 4 mL/min — ABNORMAL LOW (ref >90)
Glucose, Bld: 400 mg/dL — ABNORMAL HIGH (ref 70–99)
Potassium: 4.9 mEq/L (ref 3.5–5.1)
Total Bilirubin: 0.4 mg/dL (ref 0.3–1.2)
Total Protein: 7.1 g/dL (ref 6.0–8.3)

## 2012-07-20 LAB — CBC WITH DIFFERENTIAL/PLATELET
Basophils Relative: 0 % (ref 0–1)
Eosinophils Absolute: 0 10*3/uL (ref 0.0–0.7)
Eosinophils Relative: 0 % (ref 0–5)
Hemoglobin: 11.6 g/dL — ABNORMAL LOW (ref 12.0–15.0)
Lymphs Abs: 0.8 10*3/uL (ref 0.7–4.0)
MCH: 31.9 pg (ref 26.0–34.0)
MCHC: 34.4 g/dL (ref 30.0–36.0)
MCV: 92.6 fL (ref 78.0–100.0)
Monocytes Relative: 4 % (ref 3–12)
Neutrophils Relative %: 87 % — ABNORMAL HIGH (ref 43–77)
Platelets: 115 10*3/uL — ABNORMAL LOW (ref 150–400)
RBC: 3.64 MIL/uL — ABNORMAL LOW (ref 3.87–5.11)

## 2012-07-20 LAB — GLUCOSE, CAPILLARY: Glucose-Capillary: 293 mg/dL — ABNORMAL HIGH (ref 70–99)

## 2012-07-20 MED ORDER — LORAZEPAM 2 MG/ML IJ SOLN
1.0000 mg | Freq: Once | INTRAMUSCULAR | Status: AC
  Administered 2012-07-20: 1 mg via INTRAVENOUS
  Filled 2012-07-20: qty 1

## 2012-07-20 MED ORDER — ALBUTEROL SULFATE (5 MG/ML) 0.5% IN NEBU
INHALATION_SOLUTION | RESPIRATORY_TRACT | Status: AC
  Administered 2012-07-20: 5 mg via RESPIRATORY_TRACT
  Filled 2012-07-20: qty 1

## 2012-07-20 MED ORDER — INSULIN ASPART 100 UNIT/ML ~~LOC~~ SOLN
10.0000 [IU] | Freq: Once | SUBCUTANEOUS | Status: AC
  Administered 2012-07-20: 10 [IU] via INTRAVENOUS
  Filled 2012-07-20: qty 1

## 2012-07-20 MED ORDER — INSULIN ASPART 100 UNIT/ML ~~LOC~~ SOLN
10.0000 [IU] | Freq: Once | SUBCUTANEOUS | Status: AC
  Administered 2012-07-20: 10 [IU] via SUBCUTANEOUS

## 2012-07-20 MED ORDER — INSULIN ASPART 100 UNIT/ML ~~LOC~~ SOLN
15.0000 [IU] | Freq: Once | SUBCUTANEOUS | Status: AC
  Administered 2012-07-20: 15 [IU] via INTRAVENOUS
  Filled 2012-07-20: qty 1

## 2012-07-20 MED ORDER — AEROCHAMBER PLUS W/MASK MISC
Status: AC
  Filled 2012-07-20: qty 1

## 2012-07-20 MED ORDER — ALBUTEROL SULFATE HFA 108 (90 BASE) MCG/ACT IN AERS
1.0000 | INHALATION_SPRAY | RESPIRATORY_TRACT | Status: DC | PRN
  Administered 2012-07-20: 2 via RESPIRATORY_TRACT
  Filled 2012-07-20: qty 6.7

## 2012-07-20 MED ORDER — ALBUTEROL SULFATE (5 MG/ML) 0.5% IN NEBU
5.0000 mg | INHALATION_SOLUTION | Freq: Once | RESPIRATORY_TRACT | Status: AC
  Administered 2012-07-20: 5 mg via RESPIRATORY_TRACT

## 2012-07-20 NOTE — ED Notes (Signed)
PT. REPORTS PROGRESSING SOB ONSET Monday LAST WEEK SEEN AT [facility] DIAGNOSED WITH PNEUMONIA PRESCRIBED WITH ANTIBIOTIC AND STEROID WITH NO IMPROVEMENT , HEMODIALYSIS Q TUES/THURS/SAT. DENIES FEVER .

## 2012-07-20 NOTE — ED Notes (Addendum)
Pt awake, eyes closed, arousable, interactive, answering questions appropriately, NAD, resps labored, LS tight, diminished, late expiritory wheezing, speaking in shortened phrases, increased wob, skin W&D, last HD tx (full tx) was Saturday.  "PNA not getting better with home meds".

## 2012-07-20 NOTE — ED Provider Notes (Signed)
History     CSN: [id number]  Arrival date & time 07/20/12  0101   First MD Initiated Contact with Patient 07/20/12 0114      Chief Complaint  Patient presents with  . Shortness of Breath    (Consider location/radiation/quality/duration/timing/severity/associated sxs/prior treatment) HPI 57 year old female presents to emergency department from home with complaint of shortness of breath, cough.  Symptoms started last week.  She was seen on Saturday, at [facility], she reports that she was diagnosed with pneumonia, and bronchitis.  She was placed on a Z-Pak and given prednisone.  She reports no improvement in her symptoms.  Patient has complicated past medical history of liver failure, status post transplant.  She is on dialysis, Tuesday, Thursday, Saturday, do to renal failure.  She has history of hypertension, and diabetes.  She denies history of COPD.  She is a pack-a-day smoker.  Patient went to dialysis on Saturday, and is due for dialysis later this morning.  She denies any chest pain.  Daughter reports that she is coughing up yellow sputum.  No fevers or chills noted.  Patient is able to speak in full sentences.  She became very anxious when the neb treatment ended, but improved with 2 L nasal cannula  Past Medical History  Diagnosis Date  . Liver disease     cirrhosis and this is why she had the transplant in 2011  . Hepatitis C   . Chronic back pain   . Diverticulosis   . CAD (coronary artery disease)   . Thyroid disease   . Obesity   . Leg pain   . Peripheral vascular disease hands and legs  . Anxiety   . Blood transfusion   . Asthma   . Shortness of breath   . Headache(784.0)     last migraine 6+yrs ago  . Arthritis     back  . Chronic back pain   . GERD (gastroesophageal reflux disease)     takes Omeprazole daily  . Chronic constipation     takes MIralax and Colace daily  . Oligouria   . Anemia     takes Folic Acid daily  . Hypothyroidism     takes  Synthroid daily  . Diabetes mellitus     Levemir 2units daily if > 150  . Depression     takes Cymbalta for "severe" depression  . Pneumonia   . Abdominal  pain, other specified site   . Neuromuscular disorder     carpal tunnel in right hand  . Hypertension     takes Metoprolol and Lisinopril daily, sees Dr Shary Decamp  . Chronic kidney disease     Tue, Thur, Sat, [facility], see Dr. Hyman Hopes  . Pneumonia     Past Surgical History  Procedure Laterality Date  . Liver transplant  10/25/2009    sees Dr Barnetta Hammersmith 1 every 6 months, saw last in Dec 2013. Odette Horns Coord [phone]  . Small intestine surgery  90's  . Thrombectomy    . Arteriovenous graft placement  10/03/10    Left forearm AVG  . Avgg removal  12/23/2010    Procedure: REMOVAL OF ARTERIOVENOUS GORETEX GRAFT (AVGG);  Surgeon: Sherren Kerns, MD;  Location: [facility] OR;  Service: Vascular;  Laterality: Left;  procedure started @1736 -1852  . Insertion of dialysis catheter  12/23/2010    Procedure: INSERTION OF DIALYSIS CATHETER;  Surgeon: Sherren Kerns, MD;  Location: [facility] OR;  Service: Vascular;  Laterality: Right;  Right Internal Jugular  28cm dialysis catheter insertion procedure time 1701-1720   . Cholecystectomy  1993  . Cystoscopy  15+yrs ago  . Back surgery  2010  . Av fistula placement  01/29/2011    Procedure: INSERTION OF ARTERIOVENOUS (AV) GORE-TEX GRAFT ARM;  Surgeon: Sherren Kerns, MD;  Location: [facility] OR;  Service: Vascular;  Laterality: Right;  . Tubal ligation    . Av fistula placement  03/10/2011    Procedure: INSERTION OF ARTERIOVENOUS (AV) GORE-TEX GRAFT THIGH;  Surgeon: Sherren Kerns, MD;  Location: [facility] OR;  Service: Vascular;  Laterality: Left;    Family History  Problem Relation Age of Onset  . Cancer Mother   . Diabetes Mother   . Hypertension Mother   . Stroke Mother   . Cancer Father   . Anesthesia problems Neg Hx   . Hypotension Neg Hx   . Malignant hyperthermia Neg Hx   . Pseudochol  deficiency Neg Hx     History  Substance Use Topics  . Smoking status: Current Every Day Smoker -- 0.25 packs/day for 30 years    Types: Cigarettes  . Smokeless tobacco: Never Used  . Alcohol Use: No    OB History   Grav Para Term Preterm Abortions TAB SAB Ect Mult Living                  Review of Systems  See History of Present Illness; otherwise all other systems are reviewed and negative  Allergies  Acetaminophen and Codeine  Home Medications   Current Outpatient Rx  Name  Route  Sig  Dispense  Refill  . albuterol (PROVENTIL,VENTOLIN) 90 MCG/ACT inhaler   Inhalation   Inhale 2 puffs into the lungs every 4 (four) hours as needed. For shortness of breath         . aspirin EC 81 MG tablet   Oral   Take 81 mg by mouth daily.           . calcium carbonate (TUMS - DOSED IN MG ELEMENTAL CALCIUM) 500 MG chewable tablet   Oral   Chew 1 tablet by mouth daily.          . cinacalcet (SENSIPAR) 30 MG tablet   Oral   Take 30 mg by mouth daily.         [name] dicyclomine (BENTYL) 10 MG capsule   Oral   Take 10 mg by mouth every 6 (six) hours as needed (for stomach pain).         [name] docusate sodium (COLACE) 100 MG capsule   Oral   Take 100 mg by mouth 2 (two) times daily.           . DULoxetine (CYMBALTA) 60 MG capsule   Oral   Take 60 mg by mouth daily.           [name] FOLIC ACID PO   Oral   Take 0.4 mg by mouth daily.           [name] gabapentin (NEURONTIN) 300 MG capsule   Oral   Take 300 mg by mouth daily.         . insulin detemir (LEVEMIR) 100 UNIT/ML injection   Subcutaneous   Inject 5 Units into the skin at bedtime as needed. Uses only if blood sugar levels are over 150         . levothyroxine (SYNTHROID, LEVOTHROID) 150 MCG tablet   Oral   Take 150 mcg by mouth daily before breakfast.         .  lisinopril (PRINIVIL,ZESTRIL) 10 MG tablet   Oral   Take 5-10 mg by mouth daily with lunch.         . metoprolol tartrate (LOPRESSOR) 25 MG  tablet   Oral   Take 25 mg by mouth See admin instructions. Take 1 tablet at bedtime on dialysis days-Tues,Thurs sat. Takes 1 tablet twice daily on Mon,wednesday, friday and sunday         . omeprazole (PRILOSEC) 20 MG capsule   Oral   Take 20 mg by mouth daily.          . polyethylene glycol (MIRALAX / GLYCOLAX) packet   Oral   Take 17 g by mouth 2 (two) times daily. For constipation         . promethazine (PHENERGAN) 12.5 MG tablet   Oral   Take 12.5 mg by mouth every 6 (six) hours as needed. For nausea          . senna (SENOKOT) 8.6 MG tablet   Oral   Take 1 tablet by mouth every evening.          . sucralfate (CARAFATE) 1 G tablet   Oral   Take 1 g by mouth 2 (two) times daily.           . tacrolimus (PROGRAF) 1 MG capsule   Oral   Take 5 mg by mouth 2 (two) times daily.          . traMADol (ULTRAM) 50 MG tablet   Oral   Take 50 mg by mouth every 6 (six) hours as needed. pain           BP 112/62  Pulse 100  Temp(Src) 97.9 F (36.6 C) (Oral)  Resp 21  SpO2 100%  Physical Exam  Nursing note and vitals reviewed. Constitutional: She is oriented to person, place, and time. She appears well-developed and well-nourished. She appears distressed (anxious, dyspneic).  HENT:  Head: Normocephalic and atraumatic.  Nose: Nose normal.  Mouth/Throat: Oropharynx is clear and moist.  Eyes: Conjunctivae and EOM are normal. Pupils are equal, round, and reactive to light.  Neck: Normal range of motion. Neck supple. No JVD present. No tracheal deviation present. No thyromegaly present.  Cardiovascular: Normal rate, regular rhythm, normal heart sounds and intact distal pulses.  Exam reveals no gallop and no friction rub.   No murmur heard. Pulmonary/Chest: Effort normal. No stridor. No respiratory distress. She has no wheezes. She has rales. She exhibits no tenderness.  Cough noted, diffuse rales in bases.  No wheezing.  Abdominal: Soft. Bowel sounds are normal.  She exhibits no distension and no mass. There is no tenderness. There is no rebound and no guarding.  Prior surgical scars noted to the abdomen.  Nontender, nondistended  Musculoskeletal: Normal range of motion. She exhibits no edema and no tenderness.  Lymphadenopathy:    She has no cervical adenopathy.  Neurological: She is alert and oriented to person, place, and time. She exhibits normal muscle tone. Coordination normal.  Skin: Skin is warm and dry. No rash noted. No erythema. No pallor.  Psychiatric: Her behavior is normal. Judgment and thought content normal.    ED Course  Procedures (including critical care time)  Labs Reviewed  CBC WITH DIFFERENTIAL - Abnormal; Notable for the following:    RBC 3.64 (*)    Hemoglobin 11.6 (*)    HCT 33.7 (*)    Platelets 115 (*)    Neutrophils Relative % 87 (*)    Neutro  Abs 8.0 (*)    Lymphocytes Relative 9 (*)    All other components within normal limits  COMPREHENSIVE METABOLIC PANEL - Abnormal; Notable for the following:    Sodium 130 (*)    Chloride 89 (*)    Glucose, Bld 400 (*)    BUN 65 (*)    Creatinine, Ser 9.30 (*)    Albumin 3.0 (*)    GFR calc non Af Amer 4 (*)    GFR calc Af Amer 5 (*)    All other components within normal limits  PRO B NATRIURETIC PEPTIDE - Abnormal; Notable for the following:    Pro B Natriuretic peptide (BNP) 42149.0 (*)    All other components within normal limits  TROPONIN I   Dg Chest 2 View  07/20/2012   *RADIOLOGY REPORT*  Clinical Data: Cough, chest pain and shortness of breath.  CHEST - 2 VIEW  Comparison: 07/18/2012 chest x-ray at [facility].  Findings: The pulmonary vascular and interstitial prominence again noted.  Some of this appearance is chronic.  There may be a component of mild interstitial edema or atypical interstitial infiltrates.  No focal pulmonary consolidation is identified. There is no evidence of pleural fluid.  Heart size is normal.  IMPRESSION: Persistent pulmonary  vascular and interstitial prominence.  There may be a component of mild interstitial edema.   Original Report Authenticated By: Irish Lack, M.D.     Date: 07/20/2012  Rate:100  Rhythm: sinus tachycardia  QRS Axis: normal  Intervals: normal  ST/T Wave abnormalities: nonspecific T wave changes  Conduction Disutrbances:none  Narrative Interpretation: lateral t wave inversion V6, flattening V5  Old EKG Reviewed: changes noted   1. Dyspnea   2. Hyperglycemia   3. End stage renal disease dialysis t/r/sat       MDM  57 year old female with shortness of breath and cough for a week.  Records received from [facility].  Patient was diagnosed with bronchitis, COPD, with acute lower respiratory infection.  She was given Z-Pak and prednisone.  Patient noted to have a hyperglycemia here, most likely due to the recent steroids.  Patient seems to have anxiety component to her dyspnea.  When she was switched from albuterol treatment to toom air.  She became very anxious and grunted with each respiration.  When she was placed on nasal cannula not hooke to the wall, she improved.  Patient received Ativan, and her heart rate, blood pressure, and respiratory rate, have all improved.  She is satting 100%.  Chest x-ray here shows mainly interstitial edema, which should improve with her dialysis today.  Will also give her albuterol inhaler for her cough.  She may have an element of viral bronchitis.  She's been advised to stop smoking.        Olivia Mackie, MD 07/20/12 [id number]

## 2012-07-27 ENCOUNTER — Inpatient Hospital Stay (HOSPITAL_COMMUNITY): Payer: Medicare Other

## 2012-07-27 ENCOUNTER — Encounter (HOSPITAL_COMMUNITY): Payer: Self-pay | Admitting: Nephrology

## 2012-07-27 ENCOUNTER — Inpatient Hospital Stay (HOSPITAL_COMMUNITY)
Admission: AD | Admit: 2012-07-27 | Discharge: 2012-07-30 | DRG: 286 | Disposition: A | Discharge status: Home / Self Care | Payer: Medicare Other | Source: Other Acute Inpatient Hospital | Attending: Cardiovascular Disease | Admitting: Cardiovascular Disease

## 2012-07-27 DIAGNOSIS — K219 Gastro-esophageal reflux disease without esophagitis: Secondary | ICD-10-CM | POA: Diagnosis present

## 2012-07-27 DIAGNOSIS — K7460 Unspecified cirrhosis of liver: Secondary | ICD-10-CM | POA: Diagnosis not present

## 2012-07-27 DIAGNOSIS — N186 End stage renal disease: Secondary | ICD-10-CM | POA: Diagnosis not present

## 2012-07-27 DIAGNOSIS — I2510 Atherosclerotic heart disease of native coronary artery without angina pectoris: Secondary | ICD-10-CM | POA: Diagnosis present

## 2012-07-27 DIAGNOSIS — E039 Hypothyroidism, unspecified: Secondary | ICD-10-CM | POA: Diagnosis present

## 2012-07-27 DIAGNOSIS — N189 Chronic kidney disease, unspecified: Secondary | ICD-10-CM | POA: Diagnosis not present

## 2012-07-27 DIAGNOSIS — Z944 Liver transplant status: Secondary | ICD-10-CM

## 2012-07-27 DIAGNOSIS — J811 Chronic pulmonary edema: Secondary | ICD-10-CM | POA: Diagnosis not present

## 2012-07-27 DIAGNOSIS — D631 Anemia in chronic kidney disease: Secondary | ICD-10-CM | POA: Diagnosis present

## 2012-07-27 DIAGNOSIS — E119 Type 2 diabetes mellitus without complications: Secondary | ICD-10-CM | POA: Diagnosis not present

## 2012-07-27 DIAGNOSIS — I5041 Acute combined systolic (congestive) and diastolic (congestive) heart failure: Secondary | ICD-10-CM | POA: Diagnosis not present

## 2012-07-27 DIAGNOSIS — I119 Hypertensive heart disease without heart failure: Secondary | ICD-10-CM | POA: Diagnosis present

## 2012-07-27 DIAGNOSIS — R011 Cardiac murmur, unspecified: Secondary | ICD-10-CM | POA: Diagnosis present

## 2012-07-27 DIAGNOSIS — Z952 Presence of prosthetic heart valve: Secondary | ICD-10-CM | POA: Diagnosis present

## 2012-07-27 DIAGNOSIS — I509 Heart failure, unspecified: Secondary | ICD-10-CM | POA: Diagnosis not present

## 2012-07-27 DIAGNOSIS — Z992 Dependence on renal dialysis: Secondary | ICD-10-CM | POA: Diagnosis not present

## 2012-07-27 DIAGNOSIS — J449 Chronic obstructive pulmonary disease, unspecified: Secondary | ICD-10-CM | POA: Diagnosis not present

## 2012-07-27 DIAGNOSIS — I120 Hypertensive chronic kidney disease with stage 5 chronic kidney disease or end stage renal disease: Secondary | ICD-10-CM | POA: Diagnosis not present

## 2012-07-27 DIAGNOSIS — I359 Nonrheumatic aortic valve disorder, unspecified: Secondary | ICD-10-CM | POA: Diagnosis present

## 2012-07-27 DIAGNOSIS — E785 Hyperlipidemia, unspecified: Secondary | ICD-10-CM | POA: Diagnosis present

## 2012-07-27 DIAGNOSIS — E7800 Pure hypercholesterolemia, unspecified: Secondary | ICD-10-CM | POA: Diagnosis not present

## 2012-07-27 DIAGNOSIS — R079 Chest pain, unspecified: Secondary | ICD-10-CM | POA: Diagnosis not present

## 2012-07-27 DIAGNOSIS — E8779 Other fluid overload: Secondary | ICD-10-CM | POA: Diagnosis not present

## 2012-07-27 DIAGNOSIS — I200 Unstable angina: Secondary | ICD-10-CM

## 2012-07-27 DIAGNOSIS — R9431 Abnormal electrocardiogram [ECG] [EKG]: Secondary | ICD-10-CM | POA: Diagnosis not present

## 2012-07-27 DIAGNOSIS — N039 Chronic nephritic syndrome with unspecified morphologic changes: Secondary | ICD-10-CM | POA: Diagnosis not present

## 2012-07-27 DIAGNOSIS — I350 Nonrheumatic aortic (valve) stenosis: Secondary | ICD-10-CM

## 2012-07-27 DIAGNOSIS — E1122 Type 2 diabetes mellitus with diabetic chronic kidney disease: Secondary | ICD-10-CM | POA: Diagnosis present

## 2012-07-27 DIAGNOSIS — I129 Hypertensive chronic kidney disease with stage 1 through stage 4 chronic kidney disease, or unspecified chronic kidney disease: Secondary | ICD-10-CM | POA: Diagnosis not present

## 2012-07-27 DIAGNOSIS — R0902 Hypoxemia: Secondary | ICD-10-CM | POA: Diagnosis not present

## 2012-07-27 LAB — CBC
HCT: 33.2 % — ABNORMAL LOW (ref 36.0–46.0)
Hemoglobin: 11.3 g/dL — ABNORMAL LOW (ref 12.0–15.0)
MCH: 31 pg (ref 26.0–34.0)
MCHC: 34 g/dL (ref 30.0–36.0)
MCV: 91.2 fL (ref 78.0–100.0)
Platelets: 170 10*3/uL (ref 150–400)
RBC: 3.64 MIL/uL — ABNORMAL LOW (ref 3.87–5.11)
RDW: 14 % (ref 11.5–15.5)
WBC: 6.2 10*3/uL (ref 4.0–10.5)

## 2012-07-27 LAB — PROTIME-INR
INR: 0.94 (ref 0.00–1.49)
Prothrombin Time: 12.5 seconds (ref 11.6–15.2)

## 2012-07-27 LAB — GLUCOSE, CAPILLARY
Glucose-Capillary: 152 mg/dL — ABNORMAL HIGH (ref 70–99)
Glucose-Capillary: 163 mg/dL — ABNORMAL HIGH (ref 70–99)

## 2012-07-27 LAB — COMPREHENSIVE METABOLIC PANEL
ALT: 20 U/L (ref 0–35)
AST: 25 U/L (ref 0–37)
Albumin: 2.7 g/dL — ABNORMAL LOW (ref 3.5–5.2)
Alkaline Phosphatase: 71 U/L (ref 39–117)
BUN: 24 mg/dL — ABNORMAL HIGH (ref 6–23)
CO2: 26 mEq/L (ref 19–32)
Calcium: 8.5 mg/dL (ref 8.4–10.5)
Chloride: 100 mEq/L (ref 96–112)
Creatinine, Ser: 5.21 mg/dL — ABNORMAL HIGH (ref 0.50–1.10)
GFR calc Af Amer: 10 mL/min — ABNORMAL LOW (ref >90)
GFR calc non Af Amer: 8 mL/min — ABNORMAL LOW (ref >90)
Glucose, Bld: 132 mg/dL — ABNORMAL HIGH (ref 70–99)
Potassium: 3.2 mEq/L — ABNORMAL LOW (ref 3.5–5.1)
Sodium: 138 mEq/L (ref 135–145)
Total Bilirubin: 0.5 mg/dL (ref 0.3–1.2)
Total Protein: 6.1 g/dL (ref 6.0–8.3)

## 2012-07-27 LAB — TSH: TSH: 0.313 u[IU]/mL — ABNORMAL LOW (ref 0.350–4.500)

## 2012-07-27 MED ORDER — SENNA 8.6 MG PO TABS
1.0000 | ORAL_TABLET | Freq: Every evening | ORAL | Status: DC
  Administered 2012-07-27 – 2012-07-29 (×3): 8.6 mg via ORAL
  Filled 2012-07-27 (×4): qty 1

## 2012-07-27 MED ORDER — POLYETHYLENE GLYCOL 3350 17 G PO PACK
17.0000 g | PACK | Freq: Two times a day (BID) | ORAL | Status: DC
  Administered 2012-07-27 – 2012-07-30 (×5): 17 g via ORAL
  Filled 2012-07-27 (×8): qty 1

## 2012-07-27 MED ORDER — DOXERCALCIFEROL 4 MCG/2ML IV SOLN
8.0000 ug | INTRAVENOUS | Status: DC
  Administered 2012-07-27: 8 ug via INTRAVENOUS
  Filled 2012-07-27 (×2): qty 4

## 2012-07-27 MED ORDER — SODIUM CHLORIDE 0.9 % IV SOLN
62.5000 mg | INTRAVENOUS | Status: DC
  Administered 2012-07-29: 62.5 mg via INTRAVENOUS
  Filled 2012-07-27 (×2): qty 5

## 2012-07-27 MED ORDER — ALPRAZOLAM 0.25 MG PO TABS
0.2500 mg | ORAL_TABLET | Freq: Three times a day (TID) | ORAL | Status: DC | PRN
  Administered 2012-07-28 – 2012-07-29 (×2): 0.25 mg via ORAL
  Filled 2012-07-27 (×2): qty 1

## 2012-07-27 MED ORDER — CALCIUM CARBONATE ANTACID 500 MG PO CHEW
1.0000 | CHEWABLE_TABLET | Freq: Every day | ORAL | Status: DC
  Administered 2012-07-27 – 2012-07-30 (×3): 200 mg via ORAL
  Filled 2012-07-27 (×4): qty 1

## 2012-07-27 MED ORDER — METOPROLOL TARTRATE 25 MG PO TABS
25.0000 mg | ORAL_TABLET | ORAL | Status: DC
  Administered 2012-07-28: 25 mg via ORAL
  Filled 2012-07-27 (×4): qty 1

## 2012-07-27 MED ORDER — DOXERCALCIFEROL 4 MCG/2ML IV SOLN
INTRAVENOUS | Status: AC
  Administered 2012-07-28: 11:00:00
  Filled 2012-07-27: qty 4

## 2012-07-27 MED ORDER — GABAPENTIN 300 MG PO CAPS
300.0000 mg | ORAL_CAPSULE | Freq: Every day | ORAL | Status: DC
  Administered 2012-07-27 – 2012-07-30 (×3): 300 mg via ORAL
  Filled 2012-07-27 (×4): qty 1

## 2012-07-27 MED ORDER — NEPRO/CARBSTEADY PO LIQD
237.0000 mL | ORAL | Status: DC | PRN
  Administered 2012-07-27: 1 via ORAL

## 2012-07-27 MED ORDER — SUCRALFATE 1 G PO TABS
1.0000 g | ORAL_TABLET | Freq: Two times a day (BID) | ORAL | Status: DC
  Administered 2012-07-27 – 2012-07-30 (×6): 1 g via ORAL
  Filled 2012-07-27 (×8): qty 1

## 2012-07-27 MED ORDER — TACROLIMUS 1 MG PO CAPS
5.0000 mg | ORAL_CAPSULE | Freq: Two times a day (BID) | ORAL | Status: DC
  Administered 2012-07-27 – 2012-07-30 (×7): 5 mg via ORAL
  Filled 2012-07-27 (×9): qty 5

## 2012-07-27 MED ORDER — DULOXETINE HCL 60 MG PO CPEP
60.0000 mg | ORAL_CAPSULE | Freq: Every day | ORAL | Status: DC
  Administered 2012-07-27 – 2012-07-30 (×3): 60 mg via ORAL
  Filled 2012-07-27 (×4): qty 1

## 2012-07-27 MED ORDER — ASPIRIN EC 81 MG PO TBEC
81.0000 mg | DELAYED_RELEASE_TABLET | Freq: Every day | ORAL | Status: DC
  Administered 2012-07-27 – 2012-07-30 (×3): 81 mg via ORAL
  Filled 2012-07-27 (×4): qty 1

## 2012-07-27 MED ORDER — INSULIN ASPART 100 UNIT/ML ~~LOC~~ SOLN
0.0000 [IU] | Freq: Every day | SUBCUTANEOUS | Status: DC
  Administered 2012-07-28: 2 [IU] via SUBCUTANEOUS

## 2012-07-27 MED ORDER — LIDOCAINE-PRILOCAINE 2.5-2.5 % EX CREA: 1.0000 "application " | TOPICAL_CREAM | CUTANEOUS | Status: DC | PRN

## 2012-07-27 MED ORDER — TRAMADOL HCL 50 MG PO TABS
50.0000 mg | ORAL_TABLET | Freq: Four times a day (QID) | ORAL | Status: DC | PRN
  Administered 2012-07-27 – 2012-07-29 (×3): 50 mg via ORAL
  Filled 2012-07-27 (×3): qty 1

## 2012-07-27 MED ORDER — LIDOCAINE HCL (PF) 1 % IJ SOLN: 5.0000 mL | INTRAMUSCULAR | Status: DC | PRN

## 2012-07-27 MED ORDER — HEPARIN SODIUM (PORCINE) 5000 UNIT/ML IJ SOLN
5000.0000 [IU] | Freq: Three times a day (TID) | INTRAMUSCULAR | Status: DC
  Administered 2012-07-27 – 2012-07-30 (×5): 5000 [IU] via SUBCUTANEOUS
  Filled 2012-07-27 (×12): qty 1

## 2012-07-27 MED ORDER — HEPARIN SODIUM (PORCINE) 1000 UNIT/ML DIALYSIS
1000.0000 [IU] | INTRAMUSCULAR | Status: DC | PRN
  Filled 2012-07-27: qty 1

## 2012-07-27 MED ORDER — LISINOPRIL 5 MG PO TABS
5.0000 mg | ORAL_TABLET | Freq: Every day | ORAL | Status: DC
  Administered 2012-07-27 – 2012-07-30 (×3): 5 mg via ORAL
  Filled 2012-07-27 (×4): qty 1

## 2012-07-27 MED ORDER — SODIUM CHLORIDE 0.9 % IV SOLN: 100.0000 mL | INTRAVENOUS | Status: DC | PRN

## 2012-07-27 MED ORDER — ALBUTEROL SULFATE HFA 108 (90 BASE) MCG/ACT IN AERS
2.0000 | INHALATION_SPRAY | RESPIRATORY_TRACT | Status: DC | PRN
  Filled 2012-07-27: qty 6.7

## 2012-07-27 MED ORDER — METOPROLOL TARTRATE 25 MG PO TABS: 25.0000 mg | ORAL_TABLET | ORAL | Status: DC

## 2012-07-27 MED ORDER — DICYCLOMINE HCL 10 MG PO CAPS
10.0000 mg | ORAL_CAPSULE | Freq: Four times a day (QID) | ORAL | Status: DC | PRN
  Filled 2012-07-27 (×2): qty 1

## 2012-07-27 MED ORDER — SENNOSIDES 8.6 MG PO TABS: 1.0000 | ORAL_TABLET | Freq: Every evening | ORAL | Status: DC

## 2012-07-27 MED ORDER — PANTOPRAZOLE SODIUM 40 MG PO TBEC
40.0000 mg | DELAYED_RELEASE_TABLET | Freq: Every day | ORAL | Status: DC
  Administered 2012-07-27 – 2012-07-30 (×3): 40 mg via ORAL
  Filled 2012-07-27 (×4): qty 1

## 2012-07-27 MED ORDER — ALTEPLASE 2 MG IJ SOLR
2.0000 mg | Freq: Once | INTRAMUSCULAR | Status: AC | PRN
  Filled 2012-07-27: qty 2

## 2012-07-27 MED ORDER — DOCUSATE SODIUM 100 MG PO CAPS
100.0000 mg | ORAL_CAPSULE | Freq: Two times a day (BID) | ORAL | Status: DC
  Administered 2012-07-27 – 2012-07-30 (×6): 100 mg via ORAL
  Filled 2012-07-27 (×7): qty 1

## 2012-07-27 MED ORDER — METOPROLOL TARTRATE 25 MG PO TABS
25.0000 mg | ORAL_TABLET | ORAL | Status: DC
  Administered 2012-07-27 – 2012-07-30 (×2): 25 mg via ORAL
  Filled 2012-07-27 (×2): qty 1

## 2012-07-27 MED ORDER — LISINOPRIL 5 MG PO TABS: 5.0000 mg | ORAL_TABLET | Freq: Every day | ORAL | Status: DC

## 2012-07-27 MED ORDER — PENTAFLUOROPROP-TETRAFLUOROETH EX AERO: 1.0000 "application " | INHALATION_SPRAY | CUTANEOUS | Status: DC | PRN

## 2012-07-27 MED ORDER — INSULIN ASPART 100 UNIT/ML ~~LOC~~ SOLN
0.0000 [IU] | Freq: Three times a day (TID) | SUBCUTANEOUS | Status: DC
  Administered 2012-07-28: 3 [IU] via SUBCUTANEOUS
  Administered 2012-07-29: 5 [IU] via SUBCUTANEOUS
  Administered 2012-07-30: 2 [IU] via SUBCUTANEOUS
  Administered 2012-07-30: 3 [IU] via SUBCUTANEOUS

## 2012-07-27 MED ORDER — PROMETHAZINE HCL 25 MG PO TABS
12.5000 mg | ORAL_TABLET | Freq: Four times a day (QID) | ORAL | Status: DC | PRN
  Administered 2012-07-27 – 2012-07-30 (×2): 12.5 mg via ORAL
  Filled 2012-07-27 (×2): qty 1

## 2012-07-27 MED ORDER — ALBUTEROL 90 MCG/ACT IN AERS: 2.0000 | INHALATION_SPRAY | RESPIRATORY_TRACT | Status: DC | PRN

## 2012-07-27 MED ORDER — CINACALCET HCL 30 MG PO TABS
30.0000 mg | ORAL_TABLET | Freq: Two times a day (BID) | ORAL | Status: DC
  Administered 2012-07-28 – 2012-07-30 (×6): 30 mg via ORAL
  Filled 2012-07-27 (×8): qty 1

## 2012-07-27 MED ORDER — LEVOTHYROXINE SODIUM 150 MCG PO TABS
150.0000 ug | ORAL_TABLET | Freq: Every day | ORAL | Status: DC
  Administered 2012-07-28 – 2012-07-30 (×3): 150 ug via ORAL
  Filled 2012-07-27 (×5): qty 1

## 2012-07-27 NOTE — Consult Note (Addendum)
[name] 07/27/2012 [name] Requesting Physician:  Dr [name]  Reason for Consult:  ESRD patient with SOB and pulm edema HPI: The patient is a 57 y.o. year-old with hx of ESRD, liver Tx, hep C and HTN.  Pt went to HD today in [place] and was sent from there to ED in [place] for chest pain.  Also reported cough , orthopnea for about 1-2 weeks.  There was a mention of tachycardia at HD but HR in ED was wnl. She has been seen recently and put on po abx for possible PNA.  Today in ED was felt to have bronchitis. CXR at 7:30 am showed mild edema, CXR here at 2:30 shows pulm edema which is significantly worse  ROS  no fever, yellow sputum occ blood  no chest pain now  no abd pain, n/v/[name]/  no skin rash   no confusion  no jt pains    Past Medical History  Past Medical History  Diagnosis Date  . Liver disease     cirrhosis and this is why she had the transplant in 2011  . Hepatitis C   . Chronic back pain   . Diverticulosis   . CAD (coronary artery disease)   . Obesity   . Peripheral vascular disease hands and legs  . Anxiety   . Asthma   . Headache(784.0)     last migraine 6+yrs ago  . Arthritis     back  . GERD (gastroesophageal reflux disease)     takes Omeprazole daily  . Chronic constipation     takes MIralax and Colace daily  . Anemia     takes Folic Acid daily  . Hypothyroidism     takes Synthroid daily  . Diabetes mellitus     Levemir 2units daily if > 150  . Depression     takes Cymbalta for "severe" depression  . Pneumonia   . Neuromuscular disorder     carpal tunnel in right hand  . Hypertension     takes Metoprolol and Lisinopril daily, sees Dr [name]  . Pneumonia   . End stage renal disease on dialysis 02/27/2011    Kidneys shut down at time of liver transplant in Sept 2011 at [facility] in [place], she has been on HD ever since.  Dialyzes at [facility] HD on TTS schedule.  Had L forearm graft used 10 months then removed Dec 2012 due to suspected  infection.  A right upper arm AVG was placed Dec 2012 but she developed steal symptoms acutely and it was ligated the same day.  Never had an AV fistula due to small veins.  Now has L thigh AVG put in Jan 2013, has not clotted to date.      Past Surgical History  Past Surgical History  Procedure Laterality Date  . Liver transplant  10/25/2009    sees Dr [name] 1 every 6 months, saw last in Dec 2013. [name] Coord [phone]  . Small intestine surgery  90's  . Thrombectomy    . Arteriovenous graft placement  10/03/10    Left forearm AVG  . Avgg removal  12/23/2010    Procedure: REMOVAL OF ARTERIOVENOUS GORETEX GRAFT (AVGG);  Surgeon: [name] Kerns, MD;  Location: [facility] OR;  Service: Vascular;  Laterality: Left;  procedure started @1736 -1852  . Insertion of dialysis catheter  12/23/2010    Procedure: INSERTION OF DIALYSIS CATHETER;  Surgeon: [name] Kerns, MD;  Location: [facility] OR;  Service: Vascular;  Laterality:  Right;  Right Internal Jugular 28cm dialysis catheter insertion procedure time 1701-1720   . Cholecystectomy  1993  . Cystoscopy  15+yrs ago  . Back surgery  2010  . Av fistula placement  01/29/2011    Procedure: INSERTION OF ARTERIOVENOUS (AV) GORE-TEX GRAFT ARM;  Surgeon: [name] Kerns, MD;  Location: [facility] OR;  Service: Vascular;  Laterality: Right;  . Tubal ligation    . Av fistula placement  03/10/2011    Procedure: INSERTION OF ARTERIOVENOUS (AV) GORE-TEX GRAFT THIGH;  Surgeon: [name] Kerns, MD;  Location: [facility] OR;  Service: Vascular;  Laterality: Left;    Family History  Family History  Problem Relation Age of Onset  . Cancer Mother   . Diabetes Mother   . Hypertension Mother   . Stroke Mother   . Cancer Father   . Anesthesia problems Neg Hx   . Hypotension Neg Hx   . Malignant hyperthermia Neg Hx   . Pseudochol deficiency Neg Hx    Social History  reports that she has been smoking Cigarettes.  She has a 7.5 pack-year smoking history. She has never used  smokeless tobacco. She reports that she does not drink alcohol or use illicit drugs.  Allergies  Allergies  Allergen Reactions  . Acetaminophen Other (See Comments)    Liver problems  . Codeine Itching    Home medications Prior to Admission medications   Medication Sig Start Date End Date Taking? Authorizing Provider  albuterol (PROVENTIL,VENTOLIN) 90 MCG/ACT inhaler Inhale 2 puffs into the lungs every 4 (four) hours as needed. For shortness of breath   Yes Historical Provider, MD  aspirin EC 81 MG tablet Take 81 mg by mouth daily.     Yes Historical Provider, MD  calcium carbonate (TUMS - DOSED IN MG ELEMENTAL CALCIUM) 500 MG chewable tablet Chew 1 tablet by mouth daily.    Yes Historical Provider, MD  cinacalcet (SENSIPAR) 30 MG tablet Take 30 mg by mouth daily.   Yes Historical Provider, MD  dicyclomine (BENTYL) 10 MG capsule Take 10 mg by mouth every 6 (six) hours as needed (for stomach pain).   Yes Historical Provider, MD  docusate sodium (COLACE) 100 MG capsule Take 100 mg by mouth 2 (two) times daily.     Yes Historical Provider, MD  DULoxetine (CYMBALTA) 60 MG capsule Take 60 mg by mouth daily.     Yes Historical Provider, MD  FOLIC ACID PO Take 0.4 mg by mouth daily.     Yes Historical Provider, MD  gabapentin (NEURONTIN) 300 MG capsule Take 300 mg by mouth daily.   Yes Historical Provider, MD  insulin detemir (LEVEMIR) 100 UNIT/ML injection Inject 5 Units into the skin at bedtime as needed. Uses only if blood sugar levels are over 150 12/25/11  Yes [name] Mura, MD  levothyroxine (SYNTHROID, LEVOTHROID) 150 MCG tablet Take 150 mcg by mouth daily before breakfast.   Yes Historical Provider, MD  lisinopril (PRINIVIL,ZESTRIL) 5 MG tablet Take 5 mg by mouth daily. Daily at lunch   Yes Historical Provider, MD  metoprolol tartrate (LOPRESSOR) 25 MG tablet Take 25 mg by mouth See admin instructions. Take 1 tablet at bedtime on dialysis days-Tues,Thurs sat. Takes 1 tablet twice  daily on Mon,wednesday, friday and sunday 12/25/11  Yes [name] Mura, MD  omeprazole (PRILOSEC) 20 MG capsule Take 20 mg by mouth daily.    Yes Historical Provider, MD  polyethylene glycol (MIRALAX / GLYCOLAX) packet Take 17 g by mouth 2 (two) times daily.  For constipation   Yes Historical Provider, MD  predniSONE (DELTASONE) 10 MG tablet Take 10 mg by mouth daily. Take 3 tablets daily for 2 days 2 tablets for days then 1 tablet for 2 days on last course of medication   Yes Historical Provider, MD  promethazine (PHENERGAN) 12.5 MG tablet Take 12.5 mg by mouth every 6 (six) hours as needed. For nausea    Yes Historical Provider, MD  senna (SENOKOT) 8.6 MG tablet Take 1 tablet by mouth every evening.    Yes Historical Provider, MD  sucralfate (CARAFATE) 1 G tablet Take 1 g by mouth 2 (two) times daily.     Yes Historical Provider, MD  tacrolimus (PROGRAF) 1 MG capsule Take 5 mg by mouth 2 (two) times daily.    Yes Historical Provider, MD  traMADol (ULTRAM) 50 MG tablet Take 50 mg by mouth every 6 (six) hours as needed. pain   Yes Historical Provider, MD    LABS Liver Function Tests No results found for this basename: AST, ALT, ALKPHOS, BILITOT, PROT, ALBUMIN,  in the last 168 hours No results found for this basename: LIPASE, AMYLASE,  in the last 168 hours CBC No results found for this basename: WBC, NEUTROABS, HGB, HCT, MCV, PLT,  in the last 168 hours Basic Metabolic Panel No results found for this basename: NA, K, CL, CO2, GLUCOSE, BUN, CREATININE, ALB, CALCIUM, PHOS,  in the last 168 hours  Physical Exam:  Blood pressure 184/99, pulse 80, temperature 98.1 F (36.7 C), temperature source Oral, resp. rate 16, height 5' 4.5" (1.638 m), weight 75.9 kg (167 lb 5.3 oz), SpO2 99.00%. Gen: elderly AAF in no distress, looks uncomfortable, a little dyspneic, reluctant to exert herself HEENT:  EOMI, sclera anicteric, throat clear  Neck: no JVD, no LAN Chest: bibasilar exp wheezes, no bronchial  BS or rales CV: regular, no rub or gallop, pedal pulses absent Abdomen: soft, nondistended, large series of scars from liver Tx, no ascites, no HSM Ext: no LE or UE edema, no joint effusion or deformity, no gangrene or ulceration. Old AVG RUA no bruit, no signs of infection. Scars L forearm from old AVG site (removed) Neuro: alert, Ox3, no focal deficit Access: L thigh graft +bruit, HD needles still in from OP center  Outpatient HD: [place] [facility]  3.5hrs  F160  400/A1.5  EDW 71kg  2K/2.25Ca [place]  Prof none  AVG L thigh  Hectorol 8ug   Venofer 50mg /wk   Heparin none   Epo none   Impression/Plan 1. Dyspnea / pulm edema- suspect vol overload due to lean body wt loss.  HD today with max UF as tolerated, repeat CXR in am 2. ESRD, cont HT TSS 3. HTN/volume- as above. Cont metoprolol. 4. Anemia of CKD- no EPO at center 5. Secondary HPTH-  Cont Sensipar, not sure what binder she is taking 6. Liver transplant- on prograf 7. Hx hep C  Will follow.    Vinson Moselle  MD [facility] [phone] pgr    [phone] cell 07/27/2012, 5:04 PM

## 2012-07-27 NOTE — Progress Notes (Signed)
UR Chart Review Completed  

## 2012-07-27 NOTE — Progress Notes (Signed)
Hemodialysis Tx complete. Was not able to meet goal of 4-5L due to dropping BP and passing out. 3l removed.

## 2012-07-27 NOTE — Progress Notes (Signed)
Patient arrived from [facility]. Upon assessment noted that L thigh AVG was still accessed. MD was notified. Patient has orders to go to dialysis today where RN will be notified of access. Will continue to monitor

## 2012-07-27 NOTE — Progress Notes (Signed)
Pt c/o of feeling nauseated, taken out of UF, pt then stated she felt hot and began to go unresponsive. BP 64/33, HR 103. Pt given 200cc NS bolus and put in trendelenburg. She immediately became responsive following the NS bolus and BP increased to 144/89 and HR 81. Pt remains out of UF at this time for still feeling poorly.

## 2012-07-27 NOTE — H&P (Signed)
Patient ID: [name] MRN: [id number], DOB/AGE: [birth year]   Admit date: 07/27/2012   Primary Physician: [name], MD Primary Cardiologist: Dr Allyson Sabal  HPI:   57 y/o from [place] followed by the renal service, on HD T/T/S. The patient was sent from [place] with chest pain. She was initially admitted to Dr Sharyn Lull who was on unassigned cardiology call. After reviewing her records he called [place] to see her as we had just seen her in November 2013. Cath then by Dr Allyson Sabal revealed normal coronaries. Echo showed EF of 50-55% with grade 1 diastolic dysfunction and mild AS.           She was transported to [facility] today from [facility]  with complaints of chest pain with coughing and orthopnea and cough since about 3am. She tried going on dialysis but was unable to tolerate it because of SOB. She complained this am of tachycardia as well, but there is no documented arrythmia and she is currently in NSR.    Problem List: Past Medical History  Diagnosis Date  . Liver disease     cirrhosis and this is why she had the transplant in 2011  . Hepatitis C   . Chronic back pain   . Diverticulosis   . CAD (coronary artery disease)   . Thyroid disease   . Obesity   . Leg pain   . Peripheral vascular disease hands and legs  . Anxiety   . Blood transfusion   . Asthma   . Shortness of breath   . Headache(784.0)     last migraine 6+yrs ago  . Arthritis     back  . Chronic back pain   . GERD (gastroesophageal reflux disease)     takes Omeprazole daily  . Chronic constipation     takes MIralax and Colace daily  . Oligouria   . Anemia     takes Folic Acid daily  . Hypothyroidism     takes Synthroid daily  . Diabetes mellitus     Levemir 2units daily if > 150  . Depression     takes Cymbalta for "severe" depression  . Pneumonia   . Abdominal  pain, other specified site   . Neuromuscular disorder     carpal tunnel in right hand  . Hypertension     takes Metoprolol and Lisinopril daily, sees Dr Shary Decamp   . Chronic kidney disease     Tue, Thur, Sat, [facility], see Dr. Hyman Hopes  . Pneumonia     Past Surgical History  Procedure Laterality Date  . Liver transplant  10/25/2009    sees Dr Barnetta Hammersmith 1 every 6 months, saw last in Dec 2013. Odette Horns Coord [phone]  . Small intestine surgery  90's  . Thrombectomy    . Arteriovenous graft placement  10/03/10    Left forearm AVG  . Avgg removal  12/23/2010    Procedure: REMOVAL OF ARTERIOVENOUS GORETEX GRAFT (AVGG);  Surgeon: Sherren Kerns, MD;  Location: [facility] OR;  Service: Vascular;  Laterality: Left;  procedure started @1736 -1852  . Insertion of dialysis catheter  12/23/2010    Procedure: INSERTION OF DIALYSIS CATHETER;  Surgeon: Sherren Kerns, MD;  Location: [facility] OR;  Service: Vascular;  Laterality: Right;  Right Internal Jugular 28cm dialysis catheter insertion procedure time 1701-1720   . Cholecystectomy  1993  . Cystoscopy  15+yrs ago  . Back surgery  2010  . Av fistula placement  01/29/2011    Procedure: INSERTION OF ARTERIOVENOUS (AV) GORE-TEX  GRAFT ARM;  Surgeon: Sherren Kerns, MD;  Location: [facility] OR;  Service: Vascular;  Laterality: Right;  . Tubal ligation    . Av fistula placement  03/10/2011    Procedure: INSERTION OF ARTERIOVENOUS (AV) GORE-TEX GRAFT THIGH;  Surgeon: Sherren Kerns, MD;  Location: [facility] OR;  Service: Vascular;  Laterality: Left;     Allergies:  Allergies  Allergen Reactions  . Acetaminophen Other (See Comments)    Liver problems  . Codeine Itching     Home Medications Current Facility-Administered Medications  Medication Dose Route Frequency Provider Last Rate Last Dose  . albuterol (PROVENTIL,VENTOLIN) inhaler 2 puff  2 puff Inhalation Q4H PRN Abelino Derrick, PA-C      . ALPRAZolam Prudy Feeler) tablet 0.25 mg  0.25 mg Oral TID PRN Abelino Derrick, PA-C      . aspirin EC tablet 81 mg  81 mg Oral Daily Analissa Bayless K Temari Schooler, PA-C      . calcium carbonate (TUMS - dosed in mg elemental calcium) chewable tablet  200 mg of elemental calcium  1 tablet Oral Daily Perle Brickhouse K Adiana Smelcer, PA-C      . cinacalcet (SENSIPAR) tablet 30 mg  30 mg Oral BID WC Rodriquez Thorner K Clio Gerhart, PA-C      . dicyclomine (BENTYL) capsule 10 mg  10 mg Oral Q6H PRN Eda Paschal Inis Borneman, PA-C      . docusate sodium (COLACE) capsule 100 mg  100 mg Oral BID Abelino Derrick, PA-C      . DULoxetine (CYMBALTA) DR capsule 60 mg  60 mg Oral Daily Keyry Iracheta K Soua Caltagirone, PA-C      . gabapentin (NEURONTIN) capsule 300 mg  300 mg Oral Daily Naraly Fritcher K Sahory Nordling, PA-C      . heparin injection 5,000 Units  5,000 Units Subcutaneous Q8H Evolet Salminen K Jamiyah Dingley, PA-C      . insulin aspart (novoLOG) injection 0-15 Units  0-15 Units Subcutaneous TID WC Etana Beets K Donyae Kohn, PA-C      . insulin aspart (novoLOG) injection 0-5 Units  0-5 Units Subcutaneous QHS Shaira Sova K Quilla Freeze, PA-C      . [START ON 07/28/2012] levothyroxine (SYNTHROID, LEVOTHROID) tablet 150 mcg  150 mcg Oral QAC breakfast Graeme Menees K Youssef Footman, PA-C      . lisinopril (PRINIVIL,ZESTRIL) tablet 5-10 mg  5-10 mg Oral Q lunch Jaquan Sadowsky K Temple Sporer, PA-C      . metoprolol tartrate (LOPRESSOR) tablet 25 mg  25 mg Oral See admin instructions Abelino Derrick, PA-C      . pantoprazole (PROTONIX) EC tablet 40 mg  40 mg Oral Daily Gerhard Rappaport K Jonique Kulig, PA-C      . polyethylene glycol (MIRALAX / GLYCOLAX) packet 17 g  17 g Oral BID Iriel Nason K Livvy Spilman, PA-C      . promethazine (PHENERGAN) tablet 12.5 mg  12.5 mg Oral Q6H PRN Eda Paschal Veryl Winemiller, PA-C      . senna (SENOKOT) tablet 8.6 mg  1 tablet Oral QPM Anahis Furgeson K Joceline Hinchcliff, PA-C      . sucralfate (CARAFATE) tablet 1 g  1 g Oral BID Corin Tilly K Baylen Dea, PA-C      . tacrolimus (PROGRAF) capsule 5 mg  5 mg Oral BID Haylyn Halberg K Maxi Rodas, PA-C      . traMADol Janean Sark) tablet 50 mg  50 mg Oral Q6H PRN Abelino Derrick, PA-C         Family History  Problem Relation Age of Onset  . Cancer Mother   . Diabetes Mother   .  Hypertension Mother   . Stroke Mother   . Cancer Father   . Anesthesia problems Neg Hx   . Hypotension Neg Hx   . Malignant hyperthermia Neg Hx    . Pseudochol deficiency Neg Hx      History   Social History  . Marital Status: Divorced    Spouse Name: N/A    Number of Children: N/A  . Years of Education: N/A   Occupational History  . Not on file.   Social History Main Topics  . Smoking status: Current Every Day Smoker -- 0.25 packs/day for 30 years    Types: Cigarettes  . Smokeless tobacco: Never Used  . Alcohol Use: No  . Drug Use: No  . Sexually Active: No   Other Topics Concern  . Not on file   Social History Narrative  . No narrative on file     Review of Systems: General: negative for chills, fever, night sweats or weight changes.  Dermatological: negative for rash Urologic: negative for hematuria Abdominal: negative for nausea, vomiting, diarrhea, bright red blood per rectum, melena, or hematemesis Neurologic: negative for visual changes, syncope, or dizziness All other systems reviewed and are otherwise negative except as noted above.  Physical Exam: Blood pressure 153/91, pulse 79, temperature 97.4 F (36.3 C), temperature source Oral, resp. rate 20, height 5' 4.5" (1.638 m), weight 75.025 kg (165 lb 6.4 oz), SpO2 100.00%.  General appearance: alert, cooperative and no distress Neck: no carotid bruit and no JVD Lungs: decreased breath sounds and exp wheezing Heart: regular rate and rhythm, 2/6 MR noted Abdomen: large surgical scar Extremities: no edema, dialysis catheter RFA Pulses: 2+ and symmetric Skin: Skin color, texture, turgor normal. No rashes or lesions Neurologic: Grossly normal  EKG-NSR NSST changes    Labs:   Results for orders placed during the hospital encounter of 07/27/12 (from the past 24 hour(s))  GLUCOSE, CAPILLARY     Status: Abnormal   Collection Time    07/27/12 11:31 AM      Result Value Range   Glucose-Capillary 152 (*) 70 - 99 mg/dL        ASSESSMENT AND PLAN:  Principal Problem:   Dyspnea Active Problems:   Chest pain   DIABETES MELLITUS, TYPE II   End  stage renal disease dialysis t/r/sat   HEPATITIS C   ANEMIA, CHRONIC   HYPERTENSION- LVH, EF 50-55%. garde 1 diastolic dysf. 11/13   S/P liver transplant at [facility] 9/11   Normal coronary arteries- 11/13 cath   Aortic stenosis, mild-2D 11/13    PLAN: Admit, monitor, check echo (? New MR on exam). Renal service consulted for dialysis.    Deland Pretty, PA-C 07/27/2012, 12:17 PM

## 2012-07-28 ENCOUNTER — Inpatient Hospital Stay (HOSPITAL_COMMUNITY): Payer: Medicare Other

## 2012-07-28 DIAGNOSIS — I5041 Acute combined systolic (congestive) and diastolic (congestive) heart failure: Secondary | ICD-10-CM | POA: Diagnosis not present

## 2012-07-28 DIAGNOSIS — N186 End stage renal disease: Secondary | ICD-10-CM | POA: Diagnosis not present

## 2012-07-28 DIAGNOSIS — I359 Nonrheumatic aortic valve disorder, unspecified: Secondary | ICD-10-CM

## 2012-07-28 DIAGNOSIS — J811 Chronic pulmonary edema: Secondary | ICD-10-CM | POA: Diagnosis not present

## 2012-07-28 DIAGNOSIS — D631 Anemia in chronic kidney disease: Secondary | ICD-10-CM | POA: Diagnosis not present

## 2012-07-28 DIAGNOSIS — E8779 Other fluid overload: Secondary | ICD-10-CM | POA: Diagnosis not present

## 2012-07-28 DIAGNOSIS — R9431 Abnormal electrocardiogram [ECG] [EKG]: Secondary | ICD-10-CM | POA: Diagnosis not present

## 2012-07-28 DIAGNOSIS — I120 Hypertensive chronic kidney disease with stage 5 chronic kidney disease or end stage renal disease: Secondary | ICD-10-CM | POA: Diagnosis not present

## 2012-07-28 DIAGNOSIS — Z944 Liver transplant status: Secondary | ICD-10-CM | POA: Diagnosis not present

## 2012-07-28 DIAGNOSIS — I509 Heart failure, unspecified: Secondary | ICD-10-CM | POA: Diagnosis not present

## 2012-07-28 DIAGNOSIS — R079 Chest pain, unspecified: Secondary | ICD-10-CM | POA: Diagnosis not present

## 2012-07-28 LAB — GLUCOSE, CAPILLARY
Glucose-Capillary: 112 mg/dL — ABNORMAL HIGH (ref 70–99)
Glucose-Capillary: 162 mg/dL — ABNORMAL HIGH (ref 70–99)
Glucose-Capillary: 69 mg/dL — ABNORMAL LOW (ref 70–99)
Glucose-Capillary: 80 mg/dL (ref 70–99)
Glucose-Capillary: 141 mg/dL — ABNORMAL HIGH (ref 70–99)

## 2012-07-28 MED ORDER — NICOTINE 7 MG/24HR TD PT24
7.0000 mg | MEDICATED_PATCH | TRANSDERMAL | Status: DC
  Administered 2012-07-28 – 2012-07-30 (×2): 7 mg via TRANSDERMAL
  Filled 2012-07-28 (×3): qty 1

## 2012-07-28 MED ORDER — NITROGLYCERIN 0.4 MG SL SUBL
0.4000 mg | SUBLINGUAL_TABLET | SUBLINGUAL | Status: DC | PRN
  Administered 2012-07-28 (×2): 0.4 mg via SUBLINGUAL

## 2012-07-28 MED ORDER — NITROGLYCERIN 0.4 MG SL SUBL
SUBLINGUAL_TABLET | SUBLINGUAL | Status: AC
  Administered 2012-07-28: 0.4 mg
  Filled 2012-07-28: qty 25

## 2012-07-28 MED ORDER — RENA-VITE PO TABS
1.0000 | ORAL_TABLET | Freq: Every day | ORAL | Status: DC
  Administered 2012-07-29 – 2012-07-30 (×2): 1 via ORAL
  Filled 2012-07-28 (×2): qty 1

## 2012-07-28 MED FILL — Nutritional Supplement Liquid: ORAL | Qty: 237 | Status: AC

## 2012-07-28 NOTE — Progress Notes (Signed)
12 Lead EKG with questionable ST elevation. Rapid response RN called to examine EKG. Pt states pain is improving s/p 2 nitro to 5/10, pt more responsive, sitting on side of bed eating lunch. MD paged at [phone] to notify of status and determine if it is safe to send pt to HD. Will continue to monitor.

## 2012-07-28 NOTE — Progress Notes (Signed)
Called in to room by Nursing student, states that patient was complaining of CP 7/10. Upon reassessment pt with decreased responsiveness. MD notified. Per CMT there are questionable EKG changes on tele, MD request for 12 Lead EKG and nitro. Done, will continue to monitor.

## 2012-07-28 NOTE — Progress Notes (Signed)
Pt. Seen and examined. Agree with the NP/PA-C note as written.  Events noted today, including chest pain. Troponin has been negative. Coronaries looked normal in 12/2011. There is a report of mild AS by last echo, however, the gradients were consistent with moderate AS.  The echo now shows peak and mean gradients of 50 mmHg and 30 mmHg. This is consistent with moderate to severe AS, however, EF is mildly reduced at 45-50% with a possible inferior regional wall motion abnormality. It is possible that some of her heart failure is related to the aortic valve and or ?new wall motion abnormality. Seems unlikely to have new CAD, however, she did improve with nitroglycerin today.  I would consider repeat LHC tomorrow + pullback gradient.  Last cath did not specify any increased pullback gradient.  Chrystie Nose, MD, [facility] Attending Cardiologist [facility]

## 2012-07-28 NOTE — H&P (Signed)
Pt. Seen and examined. Agree with the NP/PA-C note as written.  Agree with echocardiogram and ultrafiltration. Loud systolic murmur.  Appreciate renal assistance. Cath in 12/2011 showed normal coronaries.  Chrystie Nose, MD, [facility] Attending Cardiologist [facility]

## 2012-07-28 NOTE — Progress Notes (Signed)
Subjective: Still orthopneic, feels a little better. CXR shows significant improvement but still some vasc congestion and IS edema  Objective Filed Vitals:   07/27/12 2030 07/27/12 2102 07/28/12 0510 07/28/12 0900  BP: 183/100 127/76 154/81 149/84  Pulse: 86 85 69 68  Temp:  98 F (36.7 C) 98.6 F (37 C) 98.3 F (36.8 C)  TempSrc:  Oral Oral   Resp: 24 24 22    Height:  5' 4.5" (1.638 m)    Weight:  75.796 kg (167 lb 1.6 oz)  73.3 kg (161 lb 9.6 oz)  SpO2:  99% 100% 100%    CBC:  Recent Labs Lab 07/27/12 1740  WBC 6.2  HGB 11.3*  HCT 33.2*  MCV 91.2  PLT 170   Basic Metabolic Panel:  Recent Labs Lab 07/27/12 1740  NA 138  K 3.2*  CL 100  CO2 26  GLUCOSE 132*  BUN 24*  CREATININE 5.21*  CALCIUM 8.5    Physical Exam:  Blood pressure 149/84, pulse 68, temperature 98.3 F (36.8 C), temperature source Oral, resp. rate 22, height 5' 4.5" (1.638 m), weight 73.3 kg (161 lb 9.6 oz), SpO2 100.00%.  Gen: elderly AAF in no distress, looks more comfortable Neck: no JVD, no LAN  Chest: wheezing resolved, no rales, lungs are clear CV: regular, no rub or gallop, pedal pulses absent  Abdomen: soft, nondistended, large series of scars from liver Tx, no ascites Ext: no LE or UE edema, no joint effusion or deformity, no gangrene or ulceration. Old AVG RUA no bruit, no signs of infection. Scars L forearm from old AVG site (removed)  Neuro: alert, Ox3, no focal deficit  Access: L thigh graft +bruit, HD needles still in from OP center   Outpatient HD: [place] [facility] 3.5hrs F160 400/A1.5 EDW 71kg 2K/2.25Ca Bath Prof none AVG L thigh Hectorol 8ug Venofer 50mg /wk Heparin none Epo none   Impression/Plan  1. Dyspnea / pulm edema- partially improved. Sharp BP drop w HD yest and could not remove more than 3kg. Will plan extra HD today and remove smaller amounts as there is still evidence of pulm edema and she is still 2-3 over dry weight. Cardiology evaluating for cardiac issues, ? MR. ECHO  done today. 2. ESRD, usual HD TSS 3. HTN/volume- as above. Cont metoprolol. 4. Anemia of CKD- no EPO at center 5. Secondary HPTH- Cont Sensipar, not sure what binder she is taking 6. Liver transplant- on prograf 7. Hx Joylene John  MD [phone] pgr    [phone] cell 07/28/2012, 11:00 AM

## 2012-07-28 NOTE — Progress Notes (Signed)
  Echocardiogram 2D Echocardiogram has been performed.  Cathie Beams 07/28/2012, 10:11 AM

## 2012-07-28 NOTE — Progress Notes (Signed)
Subjective: Breathing a lot better.  She reports a cough when she lays down.   Objective: Vital signs in last 24 hours: Temp:  [97.4 F (36.3 C)-98.6 F (37 C)] 98.3 F (36.8 C) (06/18 0900) Pulse Rate:  [68-103] 68 (06/18 0900) Resp:  [16-24] 22 (06/18 0510) BP: (64-186)/(33-101) 149/84 mmHg (06/18 0900) SpO2:  [99 %-100 %] 100 % (06/18 0900) Weight:  [165 lb 6.4 oz (75.025 kg)-167 lb 5.3 oz (75.9 kg)] 167 lb 1.6 oz (75.796 kg) (06/17 2102) Last BM Date: 07/26/12  Intake/Output from previous day: 06/17 0701 - 06/18 0700 In: 360 [P.O.:360] Out: 3008  Intake/Output this shift: Total I/O In: 100 [P.O.:100] Out: -   Medications Current Facility-Administered Medications  Medication Dose Route Frequency Provider Last Rate Last Dose  . 0.9 %  sodium chloride infusion  100 mL Intravenous PRN Maree Krabbe, MD      . 0.9 %  sodium chloride infusion  100 mL Intravenous PRN Maree Krabbe, MD      . albuterol (PROVENTIL HFA;VENTOLIN HFA) 108 (90 BASE) MCG/ACT inhaler 2 puff  2 puff Inhalation Q4H PRN [name], MD      . ALPRAZolam Prudy Feeler) tablet 0.25 mg  0.25 mg Oral TID PRN Abelino Derrick, PA-C   0.25 mg at 07/28/12 0541  . aspirin EC tablet 81 mg  81 mg Oral Daily Abelino Derrick, PA-C   81 mg at 07/27/12 1318  . calcium carbonate (TUMS - dosed in mg elemental calcium) chewable tablet 200 mg of elemental calcium  1 tablet Oral Daily Abelino Derrick, PA-C   200 mg of elemental calcium at 07/27/12 1318  . cinacalcet (SENSIPAR) tablet 30 mg  30 mg Oral BID WC Luke K Kilroy, PA-C      . dicyclomine (BENTYL) capsule 10 mg  10 mg Oral Q6H PRN Eda Paschal Kilroy, PA-C      . docusate sodium (COLACE) capsule 100 mg  100 mg Oral BID Eda Paschal Kilroy, PA-C   100 mg at 07/27/12 1318  . [START ON 07/29/2012] doxercalciferol (HECTOROL) injection 8 mcg  8 mcg Intravenous Q T,Th,Sa-HD Maree Krabbe, MD   8 mcg at 07/27/12 1749  . DULoxetine (CYMBALTA) DR capsule 60 mg  60 mg Oral Daily Abelino Derrick, PA-C   60 mg at 07/27/12 2212  . feeding supplement (NEPRO CARB STEADY) liquid 237 mL  237 mL Oral PRN Maree Krabbe, MD   1 Container at 07/27/12 2233  . [START ON 07/29/2012] ferric gluconate (NULECIT) 62.5 mg in sodium chloride 0.9 % 100 mL IVPB  62.5 mg Intravenous Q Thu-HD Maree Krabbe, MD      . gabapentin (NEURONTIN) capsule 300 mg  300 mg Oral Daily Eda Paschal Kilroy, PA-C   300 mg at 07/27/12 2212  . heparin injection 1,000 Units  1,000 Units Dialysis PRN Maree Krabbe, MD      . heparin injection 5,000 Units  5,000 Units Subcutaneous [address] [place], PA-C   5,000 Units at 07/28/12 0547  . insulin aspart (novoLOG) injection 0-15 Units  0-15 Units Subcutaneous TID WC Luke K Kilroy, PA-C      . insulin aspart (novoLOG) injection 0-5 Units  0-5 Units Subcutaneous QHS Luke K Kilroy, PA-C      . levothyroxine (SYNTHROID, LEVOTHROID) tablet 150 mcg  150 mcg Oral QAC breakfast Luke K Kilroy, PA-C      . lidocaine (PF) (XYLOCAINE) 1 %  injection 5 mL  5 mL Intradermal PRN Maree Krabbe, MD      . lidocaine-prilocaine (EMLA) cream 1 application  1 application Topical PRN Maree Krabbe, MD      . lisinopril (PRINIVIL,ZESTRIL) tablet 5 mg  5 mg Oral Q lunch Eda Paschal [place], PA-C   5 mg at 07/27/12 2212  . metoprolol tartrate (LOPRESSOR) tablet 25 mg  25 mg Oral Custom Abelino Derrick, PA-C   25 mg at 07/27/12 2217  . metoprolol tartrate (LOPRESSOR) tablet 25 mg  25 mg Oral Custom Luke K Kilroy, PA-C      . pantoprazole (PROTONIX) EC tablet 40 mg  40 mg Oral Daily Abelino Derrick, PA-C   40 mg at 07/27/12 1318  . pentafluoroprop-tetrafluoroeth (GEBAUERS) aerosol 1 application  1 application Topical PRN Maree Krabbe, MD      . polyethylene glycol (MIRALAX / GLYCOLAX) packet 17 g  17 g Oral BID Abelino Derrick, PA-C   17 g at 07/27/12 2218  . promethazine (PHENERGAN) tablet 12.5 mg  12.5 mg Oral Q6H PRN Eda Paschal Kilroy, PA-C   12.5 mg at 07/27/12 1318  . senna (SENOKOT) tablet 8.6 mg  1  tablet Oral QPM Abelino Derrick, PA-C   8.6 mg at 07/27/12 2217  . sucralfate (CARAFATE) tablet 1 g  1 g Oral BID Eda Paschal [place], PA-C   1 g at 07/27/12 2229  . tacrolimus (PROGRAF) capsule 5 mg  5 mg Oral BID Abelino Derrick, PA-C   5 mg at 07/27/12 2214  . traMADol (ULTRAM) tablet 50 mg  50 mg Oral Q6H PRN Abelino Derrick, PA-C   50 mg at 07/27/12 1347    PE: General appearance: alert, cooperative and no distress Lungs: clear to auscultation bilaterally Heart: regular rate and rhythm and 2/6 sys MM apex and axillae.  Extremities: No LEE Pulses: 2+ Radials and 1+ DP/PTs. Neurologic: Grossly normal  Lab Results:   Recent Labs  07/27/12 1740  WBC 6.2  HGB 11.3*  HCT 33.2*  PLT 170   BMET  Recent Labs  07/27/12 1740  NA 138  K 3.2*  CL 100  CO2 26  GLUCOSE 132*  BUN 24*  CREATININE 5.21*  CALCIUM 8.5   PT/INR  Recent Labs  07/27/12 1740  LABPROT 12.5  INR 0.94   Cardiac Panel (last 3 results) No results found for this basename: CKTOTAL, CKMB, TROPONINI, RELINDX,  in the last 72 hours    Assessment/Plan   Principal Problem:   Acute CHF Active Problems:   HEPATITIS C   DIABETES MELLITUS, TYPE II   ANEMIA, CHRONIC   HYPERTENSION- LVH, EF 50-55%. garde 1 diastolic dysf. 11/13   End stage renal disease on dialysis   Chest pain   S/P liver transplant at [facility] 9/11   Normal coronary arteries- 11/13 cath   Aortic stenosis, mild-2D 11/13   Plan:  Breathing better now. Net fluids after HD and UF -2.6L.   She is heading to the echo lab now.  Previous echo in Dec 2013 showed mild AS and trivial MR.  New MR?   LOS: 1 day    [name] 07/28/2012 9:06 AM

## 2012-07-29 ENCOUNTER — Encounter (HOSPITAL_COMMUNITY)
Admission: AD | Disposition: A | Discharge status: Home / Self Care | Payer: Self-pay | Source: Other Acute Inpatient Hospital | Attending: Cardiovascular Disease

## 2012-07-29 DIAGNOSIS — I2510 Atherosclerotic heart disease of native coronary artery without angina pectoris: Secondary | ICD-10-CM

## 2012-07-29 DIAGNOSIS — N186 End stage renal disease: Secondary | ICD-10-CM | POA: Diagnosis not present

## 2012-07-29 DIAGNOSIS — E8779 Other fluid overload: Secondary | ICD-10-CM | POA: Diagnosis not present

## 2012-07-29 DIAGNOSIS — J811 Chronic pulmonary edema: Secondary | ICD-10-CM | POA: Diagnosis not present

## 2012-07-29 DIAGNOSIS — D631 Anemia in chronic kidney disease: Secondary | ICD-10-CM | POA: Diagnosis not present

## 2012-07-29 HISTORY — PX: LEFT HEART CATHETERIZATION WITH CORONARY ANGIOGRAM: SHX5451

## 2012-07-29 LAB — GLUCOSE, CAPILLARY
Glucose-Capillary: 137 mg/dL — ABNORMAL HIGH (ref 70–99)
Glucose-Capillary: 123 mg/dL — ABNORMAL HIGH (ref 70–99)
Glucose-Capillary: 127 mg/dL — ABNORMAL HIGH (ref 70–99)
Glucose-Capillary: 230 mg/dL — ABNORMAL HIGH (ref 70–99)

## 2012-07-29 LAB — RENAL FUNCTION PANEL
CO2: 29 mEq/L (ref 19–32)
Calcium: 9.4 mg/dL (ref 8.4–10.5)
Creatinine, Ser: 6.59 mg/dL — ABNORMAL HIGH (ref 0.50–1.10)
Glucose, Bld: 195 mg/dL — ABNORMAL HIGH (ref 70–99)
Phosphorus: 5.7 mg/dL — ABNORMAL HIGH (ref 2.3–4.6)

## 2012-07-29 LAB — CBC
HCT: 31.4 % — ABNORMAL LOW (ref 36.0–46.0)
Hemoglobin: 11.1 g/dL — ABNORMAL LOW (ref 12.0–15.0)
Hemoglobin: 10.5 g/dL — ABNORMAL LOW (ref 12.0–15.0)
MCH: 31.4 pg (ref 26.0–34.0)
MCH: 31.4 pg (ref 26.0–34.0)
MCHC: 33.4 g/dL (ref 30.0–36.0)
MCV: 94.9 fL (ref 78.0–100.0)
Platelets: 112 10*3/uL — ABNORMAL LOW (ref 150–400)
RBC: 3.53 MIL/uL — ABNORMAL LOW (ref 3.87–5.11)
RDW: 13.8 % (ref 11.5–15.5)
WBC: 4.8 10*3/uL (ref 4.0–10.5)

## 2012-07-29 LAB — CREATININE, SERUM
Creatinine, Ser: 6.8 mg/dL — ABNORMAL HIGH (ref 0.50–1.10)
GFR calc Af Amer: 7 mL/min — ABNORMAL LOW (ref >90)

## 2012-07-29 SURGERY — LEFT HEART CATHETERIZATION WITH CORONARY ANGIOGRAM
Anesthesia: LOCAL

## 2012-07-29 MED ORDER — DOXERCALCIFEROL 4 MCG/2ML IV SOLN
INTRAVENOUS | Status: AC
  Administered 2012-07-29: 8 ug via INTRAVENOUS
  Filled 2012-07-29: qty 4

## 2012-07-29 MED ORDER — LIDOCAINE HCL (PF) 1 % IJ SOLN
INTRAMUSCULAR | Status: AC
  Filled 2012-07-29: qty 30

## 2012-07-29 MED ORDER — NEPRO/CARBSTEADY PO LIQD: 237.0000 mL | ORAL | Status: DC | PRN

## 2012-07-29 MED ORDER — MIDAZOLAM HCL 2 MG/2ML IJ SOLN
INTRAMUSCULAR | Status: AC
  Filled 2012-07-29: qty 2

## 2012-07-29 MED ORDER — LIDOCAINE HCL (PF) 1 % IJ SOLN: 5.0000 mL | INTRAMUSCULAR | Status: DC | PRN

## 2012-07-29 MED ORDER — LIDOCAINE-PRILOCAINE 2.5-2.5 % EX CREA: 1.0000 "application " | TOPICAL_CREAM | CUTANEOUS | Status: DC | PRN

## 2012-07-29 MED ORDER — HEPARIN (PORCINE) IN NACL 2-0.9 UNIT/ML-% IJ SOLN
INTRAMUSCULAR | Status: AC
  Filled 2012-07-29: qty 1000

## 2012-07-29 MED ORDER — SODIUM CHLORIDE 0.9 % IV SOLN: 100.0000 mL | INTRAVENOUS | Status: DC | PRN

## 2012-07-29 MED ORDER — FENTANYL CITRATE 0.05 MG/ML IJ SOLN
INTRAMUSCULAR | Status: AC
  Filled 2012-07-29: qty 2

## 2012-07-29 MED ORDER — PENTAFLUOROPROP-TETRAFLUOROETH EX AERO: 1.0000 "application " | INHALATION_SPRAY | CUTANEOUS | Status: DC | PRN

## 2012-07-29 MED ORDER — ALTEPLASE 2 MG IJ SOLR: 2.0000 mg | Freq: Once | INTRAMUSCULAR | Status: AC | PRN

## 2012-07-29 MED ORDER — HEPARIN SODIUM (PORCINE) 1000 UNIT/ML DIALYSIS: 1000.0000 [IU] | INTRAMUSCULAR | Status: DC | PRN

## 2012-07-29 MED ORDER — NITROGLYCERIN 0.2 MG/ML ON CALL CATH LAB
INTRAVENOUS | Status: AC
  Filled 2012-07-29: qty 1

## 2012-07-29 MED ORDER — DIAZEPAM 5 MG PO TABS: 5.0000 mg | ORAL_TABLET | ORAL | Status: DC

## 2012-07-29 MED ORDER — SODIUM CHLORIDE 0.9 % IV SOLN: INTRAVENOUS | Status: DC

## 2012-07-29 NOTE — Progress Notes (Signed)
[name]  Subjective:   A little worried about upcoming heart cath  Objective Filed Vitals:   07/28/12 1720 07/28/12 1804 07/28/12 2013 07/29/12 0552  BP: 131/70 125/71 146/85 129/72  Pulse: 76 77 79 72  Temp: 98.6 F (37 C) 98.5 F (36.9 C) 98.6 F (37 C) 98.4 F (36.9 C)  TempSrc: Oral  Oral Oral  Resp: 18 16 19 20   Height:   5' 4.5" (1.638 m)   Weight:   71.804 kg (158 lb 4.8 oz)   SpO2:  100% 100% 100%   Physical Exam General: NAD breathing easily on room air Heart: RRR Lungs: now wheezes or rales Abdomen: soft Extremities: no LE edema Dialysis Access: left thigh AVGG + bruit  Outpatient HD: [place] TTS 3.5hrs F160 400/A1.5 EDW 71kg 2K/2.25Ca Bath Prof none AVG L thigh Hectorol 8ug Venofer 50mg /wk Heparin none Epo none   Assessment/Plan: 1. Dyspnea / pulm edema-  Improved with decreased volume with extra tmt. On Wed. For heart cath today; ECHO 6/18 showed EF 40-45%, markedly elevated LV filling pressures and severe Ao stenosis. 2. ESRD, usual HD TTS - [name] would be benefit by running 4 hours due to Ao stenosis - discussed with her; try keep on the dry side. No heparin HD 3. HTN/volume- as above. Cont metoprolol.Had UF 3 L Tues followed by 2 L Wed with post weight of 71.8 (EDW 71) 4. Anemia of CKD- no EPO at center Hgb 11.3 5. Secondary HPTH- Cont Sensipar, check P with pre HD labs 6. Liver transplant- on prograf 7. Hx hep C 8.   DM  BS 68 - 237  Sheffield Slider, PA-C [facility] Beeper [phone] 07/29/2012,9:57 AM  LOS: 2 days   [name] seen and examined.  Agree with assessment and plan as above. "Severe" AS noted by ECHO yesterday, helps to explain fluid excess problems [name] is having.  For heart cath today. [name] Moselle  MD [phone] pgr    [phone] cell 07/29/2012, 11:51 AM   Additional Objective Labs: Basic Metabolic Panel:  Recent Labs Lab 07/27/12 1740  NA 138  K 3.2*  CL 100  CO2 26  GLUCOSE 132*   BUN 24*  CREATININE 5.21*  CALCIUM 8.5   Liver Function Tests:  Recent Labs Lab 07/27/12 1740  AST 25  ALT 20  ALKPHOS 71  BILITOT 0.5  PROT 6.1  ALBUMIN 2.7*   CBC:  Recent Labs Lab 07/27/12 1740  WBC 6.2  HGB 11.3*  HCT 33.2*  MCV 91.2  PLT 170  CBG:  Recent Labs Lab 07/28/12 1217 07/28/12 1536 07/28/12 1750 07/28/12 2015 07/29/12 0803  GLUCAP 69* 141* 237* 220* 168*   Studies/Results: Dg Chest 1 View  07/28/2012   *RADIOLOGY REPORT*  Clinical Data: Follow up pulmonary edema, shortness of breath, [name] feeling better  CHEST - 1 VIEW  Comparison: Portable exam 0726 hours compared to 07/27/2012  Findings: Minimal enlargement of cardiac silhouette with pulmonary vascular congestion. Significantly improved pulmonary edema. Minimal bibasilar effusions. No pneumothorax.  IMPRESSION: Significantly improved pulmonary edema.   Original Report Authenticated By: Ulyses Southward, M.D.   Dg Chest Port 1 View  07/27/2012   *RADIOLOGY REPORT*  Clinical Data: CHF  PORTABLE CHEST - 1 VIEW  Comparison: 07/27/2012 07:27 hours  Findings: The cardiac shadow is stable.  There is now noted significant increased vascular congestion with pulmonary edema identified bilaterally.  No bony abnormality is seen.  IMPRESSION: Significant increase in CHF with bilateral  pulmonary edema.   Original Report Authenticated By: Alcide Clever, M.D.   Medications:   . aspirin EC  81 mg Oral Daily  . calcium carbonate  1 tablet Oral Daily  . cinacalcet  30 mg Oral BID WC  . docusate sodium  100 mg Oral BID  . doxercalciferol  8 mcg Intravenous Q T,Th,Sa-HD  . DULoxetine  60 mg Oral Daily  . ferric gluconate (FERRLECIT/NULECIT) IV  62.5 mg Intravenous Q Thu-HD  . gabapentin  300 mg Oral Daily  . heparin subcutaneous  5,000 Units Subcutaneous Q8H  . insulin aspart  0-15 Units Subcutaneous TID WC  . insulin aspart  0-5 Units Subcutaneous QHS  . levothyroxine  150 mcg Oral QAC breakfast  . lisinopril  5 mg  Oral Q lunch  . metoprolol tartrate  25 mg Oral Custom  . metoprolol tartrate  25 mg Oral Custom  . multivitamin  1 tablet Oral Daily  . nicotine  7 mg Transdermal Q24H  . pantoprazole  40 mg Oral Daily  . polyethylene glycol  17 g Oral BID  . senna  1 tablet Oral QPM  . sucralfate  1 g Oral BID  . tacrolimus  5 mg Oral BID

## 2012-07-29 NOTE — Progress Notes (Signed)
Hemodialysis Tx initiated and machine immediately failed, pts blood rinsed back and Tx aborted to change machines

## 2012-07-29 NOTE — CV Procedure (Signed)
CARDIAC CATHETERIZATION REPORT  NAME:  [name]   MRN: [id number] DOB:  1955/06/14   ADMIT DATE: 07/27/2012 Procedure Date: 07/29/2012  INTERVENTIONAL CARDIOLOGIST: Marykay Lex, M.D., MS PRIMARY CARE PROVIDER: [name], MD PRIMARY CARDIOLOGIST: [name], M.D.  PATIENT:  [name] is a 57 y.o. female from [place] followed by the renal service, on HD T/T/S. The patient was sent from [place] with chest pain. She was initially admitted to Dr Sharyn Lull who was on unassigned cardiology call. After reviewing her records he called [place] to see her as we had just seen her in November 2013. Cath then by Dr Allyson Sabal revealed normal coronaries. Echo showed EF of 50-55% with grade 1 diastolic dysfunction and mild AS.  She was transported to [facility] today from [facility] with complaints of chest pain with coughing and orthopnea and cough since about 3am. She tried going on dialysis but was unable to tolerate it because of SOB. She complained this am of tachycardia as well, but there is no documented arrythmia and she is currently in NSR.   She did have chest discomfort, her echocardiogram suggested possibility of severe aortic stenosis by gradient. She was referred by Dr. Rennis Golden for evaluation with cardiac catheterization and invasive monitoring/measurement of the aortic valve gradient.  PRE-OPERATIVE DIAGNOSIS:    Chest pain  Aortic stenosis by echocardiogram suggesting severe stenosis  PROCEDURES PERFORMED:    Left Heart Catheterization with Native Coronary Angiography.  Simultaneous left ventricular and aortic pressure monitoring with Garlon Hatchet catheter  PROCEDURE:Consent:  Risks of procedure as well as the alternatives and risks of each were explained to the (patient/caregiver).  Consent for procedure obtained. Consent for signed by MD and patient with RN witness -- placed on chart.   PROCEDURE: The patient was brought to the 2nd Floor [place] [facility] in the fasting  state and prepped and draped in the usual sterile fashion for Right groin or radial access. A modified Allen's test with plethysmography was performed, revealing excellent Ulnar artery collateral flow.  Sterile technique was used including antiseptics, cap, gloves, gown, hand hygiene, mask and sheet.  Skin prep: Chlorhexidine.  Time Out: Verified patient identification, verified procedure, site/side was marked, verified correct patient position, special equipment/implants available, medications/allergies/relevent history reviewed, required imaging and test results available.  Performed  Access: Right Common Femoral Artery; 6 Fr Sheath -- fluoroscopically guided guided modified Seldinger technique.  Diagnostic:  5 Fr JL 4, JR 4 - advanced and exchanged over standard J-wire;   Left Coronary Artery Angiography: JL4 catheter  Right Coronary Artery Angiography: JR 4 catheter  The JR 4 catheter was used with a long straight wire to cross the aortic valve. The catheter was advanced across aortic valve and exchanged over long exchange J-wire for the 6 Frb Langston catheter.  LV Hemodynamics (LV Gram): 6 Fr Langston catheter; after simultaneous hemodynamics were measured in the left ventricle and ascending aorta, a pullback was performed to confirm identical readings in the aorta with both lumen.  6 French sheath: To be removed in the Cath Lab holding area with manual pressure being held. Location confirmed fluoroscopically  MEDICATIONS:  Anesthesia:  Local Lidocaine 14 ml  Sedation:  2 mg IV Versed, 50 mcg IV fentanyl ;   Omnipaque Contrast: 50 ml  Hemodynamics: Simultaneous LV/Aortic Pressures  Central Aortic / Mean Pressures: 166/76 mmHg; 110 mmHg  Left Ventricular Pressures / EDP: 181/11 mmHg; 22 mmHg  Left Ventriculography: Not performed.  Coronary Anatomy:   Left Main:  Large-caliber vessel.. Mildly calcified with maybe 10% distal stenosis before bifurcating into the LAD and  Circumflex. LAD: Moderate large vessel that tapers into a small vessel as it reaches down to the apex. It gives rise to a small to moderate caliber first diagonal branch that in the mid vessel as it gives off a moderate caliber second diagonal branch and a septal perforator, there is a 40-50% stenosis. The vessel then tapers down to small caliber vessel at the apex giving rise to 2 smaller diagonal branches.  D1: Small to moderate caliber vessel with ostial 20-30% stenosis. The vessel bifurcates it proximally into to a very small and a small caliber vessel. Mild diffuse luminal irregularities noted.  D2: Moderate caliber vessel with several small branches across the mid inferolateral wall. Minimal luminal irregularities noted. Left Circumflex: The vessel begins as a large caliber nondominant vessel. If her case after 2 small OM branches into a OM1 and the AV groove circumflex which terminates as a OM 2/LPL 1 the goes along the inferolateral wall. Is a very very small distal AV groove circumflex. There is diffuse mild luminal irregularities in the system, but no flow-limiting lesions.  OM1: Small caliber vessel that branches very proximally into a very small branch and ACE small caliber terminal OM that goes down along the inferolateral wall wall near the apex. Diffuse luminal irregularities.  OM 2: Moderate caliber vessel that is essentially the terminal segment of the AV groove circumflex before gives off a very small distal posterior AV groove branch. Diffuse luminal irregularities.   RCA: Large caliber, dominant vessel that gives off a large RV marginal branch in the mid vessel. It bifurcates distally into the right posterior descending artery (RPDA, and the  Right Posterior AV Groove Branch (RPAV).  Minimal luminal irregularities.  RPDA: Large-caliber vessel that tapers down to a smaller vessel as it wraps the apex. As ostial roughly 10% stenosis but otherwise minimal luminal  irregularities.  RPL Sysytem:The RPAV begins as a large caliber vessel gives rise to a proximal RPL 1 that is small in diameter. It then bifurcates distally into a smaller moderate caliber R. peel 2 and a moderate caliber R. peel 3. There is a irregularity in the midportion of RPL 3 the looks like he may be a stomped off tiny branch. This is perhaps the only irregularity noted in this vessel.   PATIENT DISPOSITION:    The patient was transferred to the PACU holding area in a hemodynamicaly stable, chest pain free condition. a   The patient tolerated the procedure well, and there were no complications.  EBL:   < 10 ml  The patient was stable before, during, and after the procedure.  POST-OPERATIVE DIAGNOSIS:    Mild aortic stenosis by simultaneous measurement.   Peak gradient 15 mm Hg  Mean gradient 14.5 mmHg   Moderate lesion in the mid LAD, not flow limiting.  There is one potential lesion in in the RPL 3 that appears to be a potentially stomped off a small branch. This could potentially been the cause of one episode of pain but but not be causing persistent pain.  PLAN OF CARE:  Continue medical management. She would likely benefit from additional afterload reduction and volume removal based upon the EDP of 23 mmHg.  Would need to follow the aortic valve gradients echocardiographically.   Marykay Lex, M.D., M.S. [facility] [address] [place], [place]  [address]  [phone]  07/29/2012 1:48 PM

## 2012-07-29 NOTE — Progress Notes (Signed)
Hemodialysis Tx complete. Goal of 3L was not met due to frequent drops in BP. Despite decreasing goal during the Tx. Was able to pull 1555cc of fluid. Pt stable following Tx

## 2012-07-29 NOTE — Progress Notes (Signed)
Subjective:  No chest pain since yesterday.   Objective:  Vital Signs in the last 24 hours: Temp:  [98.4 F (36.9 C)-98.6 F (37 C)] 98.4 F (36.9 C) (06/19 0552) Pulse Rate:  [68-84] 72 (06/19 0552) Resp:  [16-20] 20 (06/19 0552) BP: (116-168)/(51-94) 129/72 mmHg (06/19 0552) SpO2:  [96 %-100 %] 100 % (06/19 0552) Weight:  [71.4 kg (157 lb 6.5 oz)-73.6 kg (162 lb 4.1 oz)] 71.804 kg (158 lb 4.8 oz) (06/18 2013)  Intake/Output from previous day:  Intake/Output Summary (Last 24 hours) at 07/29/12 0951 Last data filed at 07/29/12 0600  Gross per 24 hour  Intake    240 ml  Output   2000 ml  Net  -1760 ml    Physical Exam: General appearance: alert, cooperative and no distress Lungs: clear to auscultation bilaterally Heart: regular rate and rhythm Extrem: She has dialysis catheter LFA, prior grafts in both upper extremitys   Rate: 75  Rhythm: normal sinus rhythm  Lab Results:  Recent Labs  07/27/12 1740  WBC 6.2  HGB 11.3*  PLT 170    Recent Labs  07/27/12 1740  NA 138  K 3.2*  CL 100  CO2 26  GLUCOSE 132*  BUN 24*  CREATININE 5.21*   No results found for this basename: TROPONINI, CK, MB,  in the last 72 hours Hepatic Function Panel  Recent Labs  07/27/12 1740  PROT 6.1  ALBUMIN 2.7*  AST 25  ALT 20  ALKPHOS 71  BILITOT 0.5   No results found for this basename: CHOL,  in the last 72 hours  Recent Labs  07/27/12 1740  INR 0.94    Imaging: Imaging results have been reviewed  Cardiac Studies:  Assessment/Plan:   Principal Problem:   Acute CHF- 07/27/12 Active Problems:   Unstable angina- EKG abn, nitrate responsive chest pain   Aortic stenosis, -severe by  2D 07/28/12   DIABETES MELLITUS, TYPE II   HTN- LVH, EF45- 50%. garde 2 diastolic dysf. 07/28/12   End stage renal disease on dialysis   HEPATITIS C   ANEMIA, CHRONIC   S/P liver transplant at [facility] 9/11   Normal coronary arteries- 11/13 cath    PLAN: I discussed with Dr Rennis Golden.  Nitrate responsive chest pain, new LVD (45%) with inferior WMA. She also has severe AS on this echo (mild in November 2013). Will proceed with LHC with pullback for AS today. I will try and get her on early so she can have dialysis later. Her TSH is low, will recheck with free T4  Quintella Baton [phone] 07/29/2012, 9:51 AM  I have seen and evaluated the patient this AM along with Corine Shelter, PA. I agree with his findings, examination as well as impression recommendations.  Less CP & SOB since admission.   I have reviewed the chart - echo, last cath etc. As well as Dr. Blanchie Dessert thoughtful notes -- I agree that invasive evaluation with angiography and simultaneous LV-Ao pressure monitoring will help clarify her condition.  I spent well over 10 min discussing the cath prcedure with potential risks & complications.  The procedure with Risks/Benefits/Alternatives and Indications was reviewed with the patient.  All questions were answered.    Risks / Complications include, but not limited to: Death, MI, CVA/TIA, VF/VT (with defibrillation), Bradycardia (need for temporary pacer placement), bleeding / bruising / hematoma / pseudoaneurysm, vascular or coronary injury (with possible emergent CT or Vascular Surgery), adverse medication reactions, infection.  The appreciably increased  risk of VFVT - cardiac arrest, death & CVA in "Valve Crossing" & Ao Stenosis evaluation was discussed in detail   The patient voiced understanding and agree to proceed.    Will know more how to Rx once we have Cath data. For now, on BB & ACE-I & ASA  TSH pending - on Synthroid SSI Insulin -- glycemic control is decent.   MD Time with pt: 15 min  Rafay Dahan W, M.D., M.S. [facility] [address] [place], [place]  [address]  [phone] Pager # [phone] 07/29/2012 10:36 AM

## 2012-07-30 DIAGNOSIS — I2510 Atherosclerotic heart disease of native coronary artery without angina pectoris: Secondary | ICD-10-CM

## 2012-07-30 DIAGNOSIS — K7460 Unspecified cirrhosis of liver: Secondary | ICD-10-CM | POA: Diagnosis not present

## 2012-07-30 DIAGNOSIS — E785 Hyperlipidemia, unspecified: Secondary | ICD-10-CM | POA: Diagnosis present

## 2012-07-30 DIAGNOSIS — R9431 Abnormal electrocardiogram [ECG] [EKG]: Secondary | ICD-10-CM | POA: Diagnosis present

## 2012-07-30 DIAGNOSIS — N186 End stage renal disease: Secondary | ICD-10-CM | POA: Diagnosis not present

## 2012-07-30 DIAGNOSIS — I509 Heart failure, unspecified: Secondary | ICD-10-CM | POA: Diagnosis not present

## 2012-07-30 DIAGNOSIS — R079 Chest pain, unspecified: Secondary | ICD-10-CM | POA: Diagnosis not present

## 2012-07-30 LAB — TSH: TSH: 0.566 u[IU]/mL (ref 0.350–4.500)

## 2012-07-30 MED ORDER — METOPROLOL TARTRATE 12.5 MG HALF TABLET
12.5000 mg | ORAL_TABLET | Freq: Two times a day (BID) | ORAL | Status: DC
  Administered 2012-07-30: 12.5 mg via ORAL
  Filled 2012-07-30 (×2): qty 1

## 2012-07-30 MED ORDER — NICOTINE 7 MG/24HR TD PT24: 1.0000 | MEDICATED_PATCH | TRANSDERMAL | Status: DC

## 2012-07-30 MED ORDER — NITROGLYCERIN 0.4 MG SL SUBL: 0.4000 mg | SUBLINGUAL_TABLET | SUBLINGUAL | Status: DC | PRN

## 2012-07-30 MED ORDER — RENA-VITE PO TABS: 1.0000 | ORAL_TABLET | Freq: Every day | ORAL | Status: DC

## 2012-07-30 MED ORDER — DOXERCALCIFEROL 4 MCG/2ML IV SOLN: 8.0000 ug | INTRAVENOUS | Status: DC

## 2012-07-30 MED ORDER — SODIUM CHLORIDE 0.9 % IV SOLN: 62.5000 mg | INTRAVENOUS | Status: DC

## 2012-07-30 NOTE — Progress Notes (Signed)
Subjective:  No chest pain or SOB  Objective:  Vital Signs in the last 24 hours: Temp:  [97.4 F (36.3 C)-98.3 F (36.8 C)] 97.9 F (36.6 C) (06/20 0527) Pulse Rate:  [66-107] 66 (06/20 0527) Resp:  [14-22] 18 (06/20 0527) BP: (82-181)/(34-96) 142/58 mmHg (06/20 0527) SpO2:  [66 %-100 %] 100 % (06/20 0527) Weight:  [71.1 kg (156 lb 12 oz)-73.3 kg (161 lb 9.6 oz)] 71.1 kg (156 lb 12 oz) (06/20 0011)  Intake/Output from previous day:  Intake/Output Summary (Last 24 hours) at 07/30/12 0906 Last data filed at 07/30/12 0600  Gross per 24 hour  Intake    345 ml  Output   1555 ml  Net  -1210 ml    Physical Exam: General appearance: alert, cooperative and no distress Lungs: clear to auscultation bilaterally Heart: regular rate and rhythm RFA without hematoma   Rate: 66  Rhythm: normal sinus rhythm  Lab Results:  Recent Labs  07/29/12 1646 07/29/12 1958  WBC 4.8 4.7  HGB 11.1* 10.5*  PLT 112* 105*    Recent Labs  07/27/12 1740 07/29/12 1646 07/29/12 1958  NA 138  --  135  K 3.2*  --  4.5  CL 100  --  97  CO2 26  --  29  GLUCOSE 132*  --  195*  BUN 24*  --  30*  CREATININE 5.21* 6.80* 6.59*   No results found for this basename: TROPONINI, CK, MB,  in the last 72 hours Hepatic Function Panel  Recent Labs  07/27/12 1740 07/29/12 1958  PROT 6.1  --   ALBUMIN 2.7* 2.7*  AST 25  --   ALT 20  --   ALKPHOS 71  --   BILITOT 0.5  --    No results found for this basename: CHOL,  in the last 72 hours  Recent Labs  07/27/12 1740  INR 0.94    Imaging: Imaging results have been reviewed  Cardiac Studies:  Assessment/Plan:   Principal Problem:   Acute CHF- 07/27/12 Active Problems:   Chest pain on admission, probably related to acute CHF.   CAD- moderate with 40% - 50% LAD 07/29/12   Aortic stenosis, - mild by cath 07/29/12 (severe by 2D)   DIABETES MELLITUS, TYPE II   HTN- LVH, EF45- 50%. garde 2 diastolic dysf. 07/28/12   End stage renal disease on  dialysis   Dyslipidemia-LDL 104, not on statin with Hx of liver transplant   HEPATITIS C   HYPOTHYROIDISM   ANEMIA, CHRONIC   GERD   S/P liver transplant at [facility] 9/11   Abn EKG- related to LVH, repol changes    PLAN: I think her chest pain on admission was from acute CHF. She has a cardiologist in [place] and will arrange follow up there. We will see prn. No statin with hx of liver disease, transplant. Continue ASA, beta blocker, and ACE. Discharge later today after seen by MD.   Corine Shelter [facility] [phone] 07/30/2012, 9:06 AM   I have seen and examined the patient along with Corine Shelter PA-C.  I have reviewed the chart, notes and new data.  I agree with PA's note.  Key new complaints: feels back to normal Key examination changes: no clinical signs of CHF Key new findings / data: note elevated LVEDP at cath.  She has been going to the gym and paying more attention to her diet recently  PLAN: It is possible that her decompensation was related to true weight  loss while she has been maintained at a constant "dry weight" dialysis (she is anuric). It is probably time to rechallenge her dry weight.  Thurmon Fair, MD, [facility] [phone] 07/30/2012, 1:16 PM

## 2012-07-30 NOTE — Progress Notes (Signed)
Subjective:  No cos, no sob , dw her need for 4 hr hd time with AOS/ i did reweigh her using standing sale with 70.2 kg wt  Objective Vital signs in last 24 hours: Filed Vitals:   07/29/12 2329 07/29/12 2339 07/30/12 0011 07/30/12 0527  BP: 104/79 136/78 138/80 142/58  Pulse: 76 78 78 66  Temp: 98.3 F (36.8 C)  97.7 F (36.5 C) 97.9 F (36.6 C)  TempSrc: Oral  Oral Oral  Resp: 21 22 18 18   Height:      Weight:  71.2 kg (156 lb 15.5 oz) 71.1 kg (156 lb 12 oz)   SpO2: 99%  100% 100%   Weight change: 0 kg (0 lb)  Intake/Output Summary (Last 24 hours) at 07/30/12 1040 Last data filed at 07/30/12 0600  Gross per 24 hour  Intake    345 ml  Output   1555 ml  Net  -1210 ml   Labs: Basic Metabolic Panel:  Recent Labs Lab 07/27/12 1740 07/29/12 1646 07/29/12 1958  NA 138  --  135  K 3.2*  --  4.5  CL 100  --  97  CO2 26  --  29  GLUCOSE 132*  --  195*  BUN 24*  --  30*  CREATININE 5.21* 6.80* 6.59*  CALCIUM 8.5  --  9.4  PHOS  --   --  5.7*   Liver Function Tests:  Recent Labs Lab 07/27/12 1740 07/29/12 1958  AST 25  --   ALT 20  --   ALKPHOS 71  --   BILITOT 0.5  --   PROT 6.1  --   ALBUMIN 2.7* 2.7*   No results found for this basename: LIPASE, AMYLASE,  in the last 168 hours No results found for this basename: AMMONIA,  in the last 168 hours CBC:  Recent Labs Lab 07/27/12 1740 07/29/12 1646 07/29/12 1958  WBC 6.2 4.8 4.7  HGB 11.3* 11.1* 10.5*  HCT 33.2* 33.5* 31.4*  MCV 91.2 94.9 94.0  PLT 170 112* 105*   Cardiac Enzymes: No results found for this basename: CKTOTAL, CKMB, CKMBINDEX, TROPONINI,  in the last 168 hours CBG:  Recent Labs Lab 07/29/12 1358 07/29/12 1513 07/29/12 1741 07/30/12 0009 07/30/12 0806  GLUCAP 123* 127* 230* 91 125*    Iron Studies: No results found for this basename: IRON, TIBC, TRANSFERRIN, FERRITIN,  in the last 72 hours Studies/Results: No results found. Medications:   . aspirin EC  81 mg Oral Daily  .  calcium carbonate  1 tablet Oral Daily  . cinacalcet  30 mg Oral BID WC  . docusate sodium  100 mg Oral BID  . doxercalciferol  8 mcg Intravenous Q T,Th,Sa-HD  . DULoxetine  60 mg Oral Daily  . ferric gluconate (FERRLECIT/NULECIT) IV  62.5 mg Intravenous Q Thu-HD  . gabapentin  300 mg Oral Daily  . heparin subcutaneous  5,000 Units Subcutaneous Q8H  . insulin aspart  0-15 Units Subcutaneous TID WC  . insulin aspart  0-5 Units Subcutaneous QHS  . levothyroxine  150 mcg Oral QAC breakfast  . lisinopril  5 mg Oral Q lunch  . metoprolol tartrate  25 mg Oral Custom  . metoprolol tartrate  25 mg Oral Custom  . multivitamin  1 tablet Oral Daily  . nicotine  7 mg Transdermal Q24H  . pantoprazole  40 mg Oral Daily  . polyethylene glycol  17 g Oral BID  . senna  1 tablet Oral QPM  .  sucralfate  1 g Oral BID  . tacrolimus  5 mg Oral BID   .  Physical Exam: General: Alert  NAD appropriate Heart: RRR with 2/6 mur of AOS Lungs: CTA bilaterdally  Abdomen: BS pos. , soft ,nontender Extremities: Dialysis Access: no pedal edema , pos. Bruit Left fem  avgg   Outpatient HD: [facility] 3.5hrs F160 400/A1.5 EDW 71kg 2K/2.25Ca Bath Prof none AVG L thigh Hectorol 8ug Venofer 50mg /wk Heparin none Epo none   Assessment/Plan:  1. Dyspnea / pulm edema- resolved by cxr. No severe AS on cath, no sig CAD either. Hypotension limiting UF w HD, need to decrease BP meds, will stop lisinopril and lower metoprolol to 12.5 bid 2. ESRD, usual HD TTS - ( ) notify center of increased time to 4hrs 3. HTN/volume- as above 4. Anemia of CKD- no EPO at center Hgb 11.3. 10.5  Restart epo 2000units  qhd 5. Secondary HPTH- Cont Sensipar, and no change with binders. 6. Liver transplant- on prograf 7. Hx hep C 8. DM 9. Dispo- per primary, OK for d/c from renal standpoint   Lenny Pastel, PA-C [facility] [phone] 07/30/2012,10:40 AM  LOS: 3 days   Patient seen and examined.  I agree  with plan as above with additions as indicated. Vinson Moselle  MD [phone] pgr    [phone] cell 07/30/2012, 12:03 PM

## 2012-07-30 NOTE — Discharge Summary (Addendum)
Patient ID: [name],  MRN: [id number], DOB/AGE: [birth year] 57 y.o.  Admit date: 07/27/2012 Discharge date: 08/06/2012  Primary Care Provider:  Primary Cardiologist: [facility]  Discharge Diagnoses Principal Problem:   Acute combined systolic and diastolic heart failure Active Problems:   HEPATITIS C   HYPOTHYROIDISM   DIABETES MELLITUS, TYPE II   ANEMIA, CHRONIC   HTN- LVH, EF45- 50%. garde 2 diastolic dysf. 07/28/12   GERD   End stage renal disease on dialysis   Chest pain on admission, probably related to acute CHF.   S/P liver transplant at [facility] 9/11   CAD- moderate with 40% - 50% LAD 07/29/12   Aortic stenosis, - mild by cath 07/29/12 (severe by 2D)   Dyslipidemia-LDL 104, not on statin with Hx of liver transplant   Abn EKG- related to LVH, repol changes    Procedures: Cardiac cath 07/29/12   Hospital Course:  57 y/o from [place] followed by the renal service, on HD T/T/S. The patient was sent from [place] with chest pain 07/27/12. She was initially admitted to Dr Sharyn Lull who was on unassigned cardiology call. After reviewing her records he called [place] to see her as we had just seen her in November 2013. Cath then by Dr Allyson Sabal revealed normal coronaries. Echo showed EF of 50-55% with grade 1 diastolic dysfunction and mild AS.              She was transported to [facility] with complaints of chest pain with coughing and orthopnea and cough. She tried going on dialysis but was unable to tolerate it because of SOB. When admitted she was in CHF. She was seen by the renal service and dialyzed.  An echo was done that showed severe AS and inferior WMA that was new. Her EF was 45%, previously >50%. It was decided to proceed with diagnostic cath. This was done 07/29/12 and showed mild AS. She had moderate LAD disease with a 40-50% LAD. It was felt her admission symptoms were probably from acute CHF. We feel she can be discharge 07/30/12 and follow up with her cardiologist in  [place].     Discharge Vitals:  Blood pressure 112/67, pulse 70, temperature 98.1 F (36.7 C), temperature source Oral, resp. rate 17, height 5' 4.5" (1.638 m), weight 156 lb 12 oz (71.1 kg), SpO2 100.00%.    Labs: No results found for this or any previous visit (from the past 48 hour(s)).  Disposition:      Follow-up Information   Follow up In 2 weeks.   Contact information:   Call your cardiologist in [place]      Discharge Medications:    Medication List    STOP taking these medications       FOLIC ACID PO     predniSONE 10 MG tablet  Commonly known as:  DELTASONE      TAKE these medications       albuterol 90 MCG/ACT inhaler  Commonly known as:  PROVENTIL,VENTOLIN  Inhale 2 puffs into the lungs every 4 (four) hours as needed. For shortness of breath     aspirin EC 81 MG tablet  Take 81 mg by mouth daily.     calcium carbonate 500 MG chewable tablet  Commonly known as:  TUMS - dosed in mg elemental calcium  Chew 1 tablet by mouth daily.     cinacalcet 30 MG tablet  Commonly known as:  SENSIPAR  Take 30 mg by mouth daily.     dicyclomine 10  MG capsule  Commonly known as:  BENTYL  Take 10 mg by mouth every 6 (six) hours as needed (for stomach pain).     docusate sodium 100 MG capsule  Commonly known as:  COLACE  Take 100 mg by mouth 2 (two) times daily.     doxercalciferol 4 MCG/2ML injection  Commonly known as:  HECTOROL  Inject 4 mLs (8 mcg total) into the vein Every Tuesday,Thursday,and Saturday with dialysis.     DULoxetine 60 MG capsule  Commonly known as:  CYMBALTA  Take 60 mg by mouth daily.     gabapentin 300 MG capsule  Commonly known as:  NEURONTIN  Take 300 mg by mouth daily.     insulin detemir 100 UNIT/ML injection  Commonly known as:  LEVEMIR  Inject 5 Units into the skin at bedtime as needed. Uses only if blood sugar levels are over 150     levothyroxine 150 MCG tablet  Commonly known as:  SYNTHROID, LEVOTHROID  Take 150  mcg by mouth daily before breakfast.     lisinopril 5 MG tablet  Commonly known as:  PRINIVIL,ZESTRIL  Take 5 mg by mouth daily. Daily at lunch     metoprolol tartrate 25 MG tablet  Commonly known as:  LOPRESSOR  Take 25 mg by mouth See admin instructions. Take 1 tablet at bedtime on dialysis days-Tues,Thurs sat.  Takes 1 tablet twice daily on Mon,wednesday, friday and sunday     multivitamin Tabs tablet  Take 1 tablet by mouth daily.     nicotine 7 mg/24hr patch  Commonly known as:  NICODERM CQ - dosed in mg/24 hr  Place 1 patch onto the skin daily.     nitroGLYCERIN 0.4 MG SL tablet  Commonly known as:  NITROSTAT  Place 1 tablet (0.4 mg total) under the tongue every 5 (five) minutes as needed for chest pain (for severe chest pressure, tightness).     omeprazole 20 MG capsule  Commonly known as:  PRILOSEC  Take 20 mg by mouth daily.     polyethylene glycol packet  Commonly known as:  MIRALAX / GLYCOLAX  Take 17 g by mouth 2 (two) times daily. For constipation     promethazine 12.5 MG tablet  Commonly known as:  PHENERGAN  Take 12.5 mg by mouth every 6 (six) hours as needed. For nausea     senna 8.6 MG tablet  Commonly known as:  SENOKOT  Take 1 tablet by mouth every evening.     sodium chloride 0.9 % SOLN 100 mL with ferric gluconate 12.5 MG/ML SOLN 62.5 mg  Inject 62.5 mg into the vein every Thursday with hemodialysis.     sucralfate 1 G tablet  Commonly known as:  CARAFATE  Take 1 g by mouth 2 (two) times daily.     tacrolimus 1 MG capsule  Commonly known as:  PROGRAF  Take 5 mg by mouth 2 (two) times daily.     traMADol 50 MG tablet  Commonly known as:  ULTRAM  Take 50 mg by mouth every 6 (six) hours as needed. pain         Duration of Discharge Encounter: Greater than 30 minutes including physician time.  Jolene Provost PA-C 08/06/2012 12:15 PM

## 2012-08-09 DIAGNOSIS — N186 End stage renal disease: Secondary | ICD-10-CM | POA: Diagnosis not present

## 2012-08-11 DIAGNOSIS — D509 Iron deficiency anemia, unspecified: Secondary | ICD-10-CM | POA: Diagnosis not present

## 2012-08-11 DIAGNOSIS — N186 End stage renal disease: Secondary | ICD-10-CM | POA: Diagnosis not present

## 2012-08-11 DIAGNOSIS — N2581 Secondary hyperparathyroidism of renal origin: Secondary | ICD-10-CM | POA: Diagnosis not present

## 2012-08-11 DIAGNOSIS — N039 Chronic nephritic syndrome with unspecified morphologic changes: Secondary | ICD-10-CM | POA: Diagnosis not present

## 2012-08-11 DIAGNOSIS — D631 Anemia in chronic kidney disease: Secondary | ICD-10-CM | POA: Diagnosis not present

## 2012-08-13 DIAGNOSIS — N2581 Secondary hyperparathyroidism of renal origin: Secondary | ICD-10-CM | POA: Diagnosis not present

## 2012-08-13 DIAGNOSIS — D509 Iron deficiency anemia, unspecified: Secondary | ICD-10-CM | POA: Diagnosis not present

## 2012-08-13 DIAGNOSIS — N186 End stage renal disease: Secondary | ICD-10-CM | POA: Diagnosis not present

## 2012-08-13 DIAGNOSIS — D631 Anemia in chronic kidney disease: Secondary | ICD-10-CM | POA: Diagnosis not present

## 2012-08-16 DIAGNOSIS — N186 End stage renal disease: Secondary | ICD-10-CM | POA: Diagnosis not present

## 2012-08-16 DIAGNOSIS — D509 Iron deficiency anemia, unspecified: Secondary | ICD-10-CM | POA: Diagnosis not present

## 2012-08-16 DIAGNOSIS — D631 Anemia in chronic kidney disease: Secondary | ICD-10-CM | POA: Diagnosis not present

## 2012-08-16 DIAGNOSIS — N2581 Secondary hyperparathyroidism of renal origin: Secondary | ICD-10-CM | POA: Diagnosis not present

## 2012-08-19 DIAGNOSIS — N039 Chronic nephritic syndrome with unspecified morphologic changes: Secondary | ICD-10-CM | POA: Diagnosis not present

## 2012-08-19 DIAGNOSIS — D509 Iron deficiency anemia, unspecified: Secondary | ICD-10-CM | POA: Diagnosis not present

## 2012-08-19 DIAGNOSIS — N2581 Secondary hyperparathyroidism of renal origin: Secondary | ICD-10-CM | POA: Diagnosis not present

## 2012-08-19 DIAGNOSIS — N186 End stage renal disease: Secondary | ICD-10-CM | POA: Diagnosis not present

## 2012-08-21 DIAGNOSIS — D509 Iron deficiency anemia, unspecified: Secondary | ICD-10-CM | POA: Diagnosis not present

## 2012-08-21 DIAGNOSIS — N186 End stage renal disease: Secondary | ICD-10-CM | POA: Diagnosis not present

## 2012-08-21 DIAGNOSIS — D631 Anemia in chronic kidney disease: Secondary | ICD-10-CM | POA: Diagnosis not present

## 2012-08-21 DIAGNOSIS — N2581 Secondary hyperparathyroidism of renal origin: Secondary | ICD-10-CM | POA: Diagnosis not present

## 2012-08-24 DIAGNOSIS — N186 End stage renal disease: Secondary | ICD-10-CM | POA: Diagnosis not present

## 2012-08-24 DIAGNOSIS — D631 Anemia in chronic kidney disease: Secondary | ICD-10-CM | POA: Diagnosis not present

## 2012-08-24 DIAGNOSIS — D509 Iron deficiency anemia, unspecified: Secondary | ICD-10-CM | POA: Diagnosis not present

## 2012-08-24 DIAGNOSIS — N039 Chronic nephritic syndrome with unspecified morphologic changes: Secondary | ICD-10-CM | POA: Diagnosis not present

## 2012-08-24 DIAGNOSIS — N2581 Secondary hyperparathyroidism of renal origin: Secondary | ICD-10-CM | POA: Diagnosis not present

## 2012-08-26 DIAGNOSIS — N2581 Secondary hyperparathyroidism of renal origin: Secondary | ICD-10-CM | POA: Diagnosis not present

## 2012-08-26 DIAGNOSIS — D631 Anemia in chronic kidney disease: Secondary | ICD-10-CM | POA: Diagnosis not present

## 2012-08-26 DIAGNOSIS — N186 End stage renal disease: Secondary | ICD-10-CM | POA: Diagnosis not present

## 2012-08-26 DIAGNOSIS — D509 Iron deficiency anemia, unspecified: Secondary | ICD-10-CM | POA: Diagnosis not present

## 2012-08-28 DIAGNOSIS — N2581 Secondary hyperparathyroidism of renal origin: Secondary | ICD-10-CM | POA: Diagnosis not present

## 2012-08-28 DIAGNOSIS — D509 Iron deficiency anemia, unspecified: Secondary | ICD-10-CM | POA: Diagnosis not present

## 2012-08-28 DIAGNOSIS — N186 End stage renal disease: Secondary | ICD-10-CM | POA: Diagnosis not present

## 2012-08-28 DIAGNOSIS — D631 Anemia in chronic kidney disease: Secondary | ICD-10-CM | POA: Diagnosis not present

## 2012-08-30 ENCOUNTER — Inpatient Hospital Stay (HOSPITAL_COMMUNITY)
Admission: AD | Admit: 2012-08-30 | Discharge: 2012-09-01 | DRG: 193 | Disposition: A | Discharge status: Home / Self Care | Payer: Medicare Other | Source: Other Acute Inpatient Hospital | Attending: Internal Medicine | Admitting: Internal Medicine

## 2012-08-30 ENCOUNTER — Encounter (HOSPITAL_COMMUNITY): Payer: Self-pay | Admitting: General Practice

## 2012-08-30 ENCOUNTER — Observation Stay (HOSPITAL_COMMUNITY): Payer: Medicare Other

## 2012-08-30 DIAGNOSIS — N186 End stage renal disease: Secondary | ICD-10-CM | POA: Diagnosis present

## 2012-08-30 DIAGNOSIS — I359 Nonrheumatic aortic valve disorder, unspecified: Secondary | ICD-10-CM | POA: Diagnosis present

## 2012-08-30 DIAGNOSIS — F329 Major depressive disorder, single episode, unspecified: Secondary | ICD-10-CM | POA: Diagnosis present

## 2012-08-30 DIAGNOSIS — J9600 Acute respiratory failure, unspecified whether with hypoxia or hypercapnia: Secondary | ICD-10-CM | POA: Diagnosis not present

## 2012-08-30 DIAGNOSIS — B1710 Acute hepatitis C without hepatic coma: Secondary | ICD-10-CM

## 2012-08-30 DIAGNOSIS — Z940 Kidney transplant status: Secondary | ICD-10-CM | POA: Diagnosis not present

## 2012-08-30 DIAGNOSIS — E8779 Other fluid overload: Secondary | ICD-10-CM | POA: Diagnosis not present

## 2012-08-30 DIAGNOSIS — D539 Nutritional anemia, unspecified: Secondary | ICD-10-CM | POA: Diagnosis not present

## 2012-08-30 DIAGNOSIS — E1122 Type 2 diabetes mellitus with diabetic chronic kidney disease: Secondary | ICD-10-CM | POA: Diagnosis present

## 2012-08-30 DIAGNOSIS — I10 Essential (primary) hypertension: Secondary | ICD-10-CM

## 2012-08-30 DIAGNOSIS — J90 Pleural effusion, not elsewhere classified: Secondary | ICD-10-CM | POA: Diagnosis not present

## 2012-08-30 DIAGNOSIS — I5041 Acute combined systolic (congestive) and diastolic (congestive) heart failure: Secondary | ICD-10-CM

## 2012-08-30 DIAGNOSIS — N189 Chronic kidney disease, unspecified: Secondary | ICD-10-CM | POA: Diagnosis not present

## 2012-08-30 DIAGNOSIS — M899 Disorder of bone, unspecified: Secondary | ICD-10-CM | POA: Diagnosis present

## 2012-08-30 DIAGNOSIS — Z992 Dependence on renal dialysis: Secondary | ICD-10-CM

## 2012-08-30 DIAGNOSIS — I119 Hypertensive heart disease without heart failure: Secondary | ICD-10-CM | POA: Diagnosis present

## 2012-08-30 DIAGNOSIS — E119 Type 2 diabetes mellitus without complications: Secondary | ICD-10-CM

## 2012-08-30 DIAGNOSIS — I120 Hypertensive chronic kidney disease with stage 5 chronic kidney disease or end stage renal disease: Secondary | ICD-10-CM | POA: Diagnosis present

## 2012-08-30 DIAGNOSIS — N2581 Secondary hyperparathyroidism of renal origin: Secondary | ICD-10-CM | POA: Diagnosis present

## 2012-08-30 DIAGNOSIS — I350 Nonrheumatic aortic (valve) stenosis: Secondary | ICD-10-CM

## 2012-08-30 DIAGNOSIS — K219 Gastro-esophageal reflux disease without esophagitis: Secondary | ICD-10-CM | POA: Diagnosis present

## 2012-08-30 DIAGNOSIS — I509 Heart failure, unspecified: Secondary | ICD-10-CM | POA: Diagnosis present

## 2012-08-30 DIAGNOSIS — E7800 Pure hypercholesterolemia, unspecified: Secondary | ICD-10-CM | POA: Diagnosis not present

## 2012-08-30 DIAGNOSIS — F17200 Nicotine dependence, unspecified, uncomplicated: Secondary | ICD-10-CM | POA: Diagnosis present

## 2012-08-30 DIAGNOSIS — J441 Chronic obstructive pulmonary disease with (acute) exacerbation: Secondary | ICD-10-CM

## 2012-08-30 DIAGNOSIS — Z7982 Long term (current) use of aspirin: Secondary | ICD-10-CM | POA: Diagnosis not present

## 2012-08-30 DIAGNOSIS — R9431 Abnormal electrocardiogram [ECG] [EKG]: Secondary | ICD-10-CM

## 2012-08-30 DIAGNOSIS — E039 Hypothyroidism, unspecified: Secondary | ICD-10-CM

## 2012-08-30 DIAGNOSIS — J189 Pneumonia, unspecified organism: Principal | ICD-10-CM

## 2012-08-30 DIAGNOSIS — Z944 Liver transplant status: Secondary | ICD-10-CM

## 2012-08-30 DIAGNOSIS — R071 Chest pain on breathing: Secondary | ICD-10-CM | POA: Diagnosis not present

## 2012-08-30 DIAGNOSIS — I959 Hypotension, unspecified: Secondary | ICD-10-CM | POA: Diagnosis present

## 2012-08-30 DIAGNOSIS — I2510 Atherosclerotic heart disease of native coronary artery without angina pectoris: Secondary | ICD-10-CM | POA: Diagnosis present

## 2012-08-30 DIAGNOSIS — E785 Hyperlipidemia, unspecified: Secondary | ICD-10-CM | POA: Diagnosis present

## 2012-08-30 DIAGNOSIS — J449 Chronic obstructive pulmonary disease, unspecified: Secondary | ICD-10-CM | POA: Diagnosis not present

## 2012-08-30 DIAGNOSIS — D631 Anemia in chronic kidney disease: Secondary | ICD-10-CM | POA: Diagnosis not present

## 2012-08-30 DIAGNOSIS — F3289 Other specified depressive episodes: Secondary | ICD-10-CM | POA: Diagnosis present

## 2012-08-30 DIAGNOSIS — Z79899 Other long term (current) drug therapy: Secondary | ICD-10-CM

## 2012-08-30 DIAGNOSIS — J811 Chronic pulmonary edema: Secondary | ICD-10-CM | POA: Diagnosis not present

## 2012-08-30 DIAGNOSIS — Z952 Presence of prosthetic heart valve: Secondary | ICD-10-CM | POA: Diagnosis present

## 2012-08-30 DIAGNOSIS — I129 Hypertensive chronic kidney disease with stage 1 through stage 4 chronic kidney disease, or unspecified chronic kidney disease: Secondary | ICD-10-CM | POA: Diagnosis not present

## 2012-08-30 HISTORY — DX: Personal history of other medical treatment: Z92.89

## 2012-08-30 HISTORY — DX: Chronic obstructive pulmonary disease, unspecified: J44.9

## 2012-08-30 HISTORY — DX: Type 2 diabetes mellitus without complications: E11.9

## 2012-08-30 HISTORY — DX: Migraine, unspecified, not intractable, without status migrainosus: G43.909

## 2012-08-30 LAB — CBC
HCT: 31.5 % — ABNORMAL LOW (ref 36.0–46.0)
Hemoglobin: 10.7 g/dL — ABNORMAL LOW (ref 12.0–15.0)
MCH: 32.4 pg (ref 26.0–34.0)
MCH: 32.7 pg (ref 26.0–34.0)
MCHC: 33.1 g/dL (ref 30.0–36.0)
MCHC: 34 g/dL (ref 30.0–36.0)
MCV: 97.8 fL (ref 78.0–100.0)
MCV: 96.3 fL (ref 78.0–100.0)
Platelets: 138 10*3/uL — ABNORMAL LOW (ref 150–400)
RDW: 15.7 % — ABNORMAL HIGH (ref 11.5–15.5)

## 2012-08-30 LAB — RENAL FUNCTION PANEL
CO2: 24 mEq/L (ref 19–32)
Calcium: 9.6 mg/dL (ref 8.4–10.5)
Creatinine, Ser: 9.05 mg/dL — ABNORMAL HIGH (ref 0.50–1.10)
GFR calc non Af Amer: 4 mL/min — ABNORMAL LOW (ref >90)

## 2012-08-30 LAB — CREATININE, SERUM: Creatinine, Ser: 8.61 mg/dL — ABNORMAL HIGH (ref 0.50–1.10)

## 2012-08-30 LAB — HEPATITIS B SURFACE ANTIGEN: Hepatitis B Surface Ag: NEGATIVE

## 2012-08-30 LAB — GLUCOSE, CAPILLARY: Glucose-Capillary: 257 mg/dL — ABNORMAL HIGH (ref 70–99)

## 2012-08-30 LAB — TROPONIN I
Troponin I: <0.3 ng/mL (ref <0.30)
Troponin I: <0.3 ng/mL (ref <0.30)

## 2012-08-30 MED ORDER — CALCIUM CARBONATE ANTACID 500 MG PO CHEW
3.0000 | CHEWABLE_TABLET | Freq: Every day | ORAL | Status: DC
  Administered 2012-08-30 – 2012-09-01 (×3): 600 mg via ORAL
  Filled 2012-08-30 (×4): qty 3

## 2012-08-30 MED ORDER — METHYLPREDNISOLONE SODIUM SUCC 125 MG IJ SOLR
60.0000 mg | Freq: Three times a day (TID) | INTRAMUSCULAR | Status: DC
Start: 2012-08-30 — End: 2012-09-01
  Administered 2012-08-30 – 2012-08-31 (×4): 60 mg via INTRAVENOUS
  Administered 2012-09-01: 05:00:00 via INTRAVENOUS
  Filled 2012-08-30 (×8): qty 0.96

## 2012-08-30 MED ORDER — POLYETHYLENE GLYCOL 3350 17 G PO PACK
17.0000 g | PACK | Freq: Two times a day (BID) | ORAL | Status: DC
  Administered 2012-08-30 – 2012-08-31 (×4): 17 g via ORAL
  Filled 2012-08-30 (×6): qty 1

## 2012-08-30 MED ORDER — VANCOMYCIN HCL IN DEXTROSE 750-5 MG/150ML-% IV SOLN
750.0000 mg | INTRAVENOUS | Status: DC
  Administered 2012-08-31: 750 mg via INTRAVENOUS
  Filled 2012-08-30: qty 150

## 2012-08-30 MED ORDER — INSULIN ASPART 100 UNIT/ML ~~LOC~~ SOLN
0.0000 [IU] | Freq: Three times a day (TID) | SUBCUTANEOUS | Status: DC
  Administered 2012-08-31 (×2): 8 [IU] via SUBCUTANEOUS
  Administered 2012-08-31: 2 [IU] via SUBCUTANEOUS
  Administered 2012-09-01: 11 [IU] via SUBCUTANEOUS
  Administered 2012-09-01: 5 [IU] via SUBCUTANEOUS

## 2012-08-30 MED ORDER — VANCOMYCIN HCL 10 G IV SOLR
1500.0000 mg | Freq: Once | INTRAVENOUS | Status: AC
  Administered 2012-08-30: 1500 mg via INTRAVENOUS
  Filled 2012-08-30 (×2): qty 1500

## 2012-08-30 MED ORDER — PANTOPRAZOLE SODIUM 40 MG PO TBEC
40.0000 mg | DELAYED_RELEASE_TABLET | Freq: Every day | ORAL | Status: DC
  Administered 2012-08-30 – 2012-09-01 (×3): 40 mg via ORAL
  Filled 2012-08-30 (×3): qty 1

## 2012-08-30 MED ORDER — CALCIUM CARBONATE ANTACID 500 MG PO CHEW
1.0000 | CHEWABLE_TABLET | Freq: Every day | ORAL | Status: DC
  Administered 2012-08-30: 200 mg via ORAL
  Filled 2012-08-30: qty 1

## 2012-08-30 MED ORDER — DEXTROSE 5 % IV SOLN
2.0000 g | INTRAVENOUS | Status: DC
  Administered 2012-08-31: 2 g via INTRAVENOUS
  Filled 2012-08-30: qty 2

## 2012-08-30 MED ORDER — METHYLPREDNISOLONE SODIUM SUCC 125 MG IJ SOLR
125.0000 mg | Freq: Three times a day (TID) | INTRAMUSCULAR | Status: DC
  Filled 2012-08-30 (×4): qty 2

## 2012-08-30 MED ORDER — TRAMADOL HCL 50 MG PO TABS
50.0000 mg | ORAL_TABLET | Freq: Four times a day (QID) | ORAL | Status: DC | PRN
  Administered 2012-08-30: 50 mg via ORAL
  Filled 2012-08-30 (×2): qty 1

## 2012-08-30 MED ORDER — SODIUM CHLORIDE 0.9 % IV SOLN: 62.5000 mg | INTRAVENOUS | Status: DC

## 2012-08-30 MED ORDER — ASPIRIN EC 81 MG PO TBEC
81.0000 mg | DELAYED_RELEASE_TABLET | Freq: Every day | ORAL | Status: DC
Start: 2012-08-30 — End: 2012-09-01
  Administered 2012-08-30 – 2012-09-01 (×3): 81 mg via ORAL
  Filled 2012-08-30 (×3): qty 1

## 2012-08-30 MED ORDER — INSULIN DETEMIR 100 UNIT/ML ~~LOC~~ SOLN
3.0000 [IU] | Freq: Every day | SUBCUTANEOUS | Status: DC
  Administered 2012-08-30 – 2012-09-01 (×3): 3 [IU] via SUBCUTANEOUS
  Filled 2012-08-30 (×3): qty 0.03

## 2012-08-30 MED ORDER — IPRATROPIUM BROMIDE 0.02 % IN SOLN: 0.5000 mg | Freq: Four times a day (QID) | RESPIRATORY_TRACT | Status: DC

## 2012-08-30 MED ORDER — GABAPENTIN 300 MG PO CAPS
300.0000 mg | ORAL_CAPSULE | Freq: Every day | ORAL | Status: DC
  Administered 2012-08-30 – 2012-08-31 (×2): 300 mg via ORAL
  Filled 2012-08-30 (×4): qty 1

## 2012-08-30 MED ORDER — DULOXETINE HCL 60 MG PO CPEP
60.0000 mg | ORAL_CAPSULE | Freq: Every day | ORAL | Status: DC
  Administered 2012-08-30 – 2012-08-31 (×2): 60 mg via ORAL
  Filled 2012-08-30 (×5): qty 1

## 2012-08-30 MED ORDER — LISINOPRIL 5 MG PO TABS
5.0000 mg | ORAL_TABLET | Freq: Every day | ORAL | Status: DC
  Administered 2012-08-30: 5 mg via ORAL
  Filled 2012-08-30: qty 1

## 2012-08-30 MED ORDER — DOCUSATE SODIUM 100 MG PO CAPS
100.0000 mg | ORAL_CAPSULE | Freq: Two times a day (BID) | ORAL | Status: DC
  Administered 2012-08-30 – 2012-09-01 (×5): 100 mg via ORAL
  Filled 2012-08-30 (×6): qty 1

## 2012-08-30 MED ORDER — HEPARIN SODIUM (PORCINE) 5000 UNIT/ML IJ SOLN
5000.0000 [IU] | Freq: Three times a day (TID) | INTRAMUSCULAR | Status: DC
  Administered 2012-08-30 – 2012-09-01 (×4): 5000 [IU] via SUBCUTANEOUS
  Filled 2012-08-30 (×10): qty 1

## 2012-08-30 MED ORDER — NICOTINE 14 MG/24HR TD PT24
14.0000 mg | MEDICATED_PATCH | Freq: Every day | TRANSDERMAL | Status: DC
  Administered 2012-08-30 – 2012-08-31 (×2): 14 mg via TRANSDERMAL
  Filled 2012-08-30 (×3): qty 1

## 2012-08-30 MED ORDER — DARBEPOETIN ALFA-POLYSORBATE 40 MCG/0.4ML IJ SOLN
40.0000 ug | Freq: Once | INTRAMUSCULAR | Status: AC
  Administered 2012-08-30: 40 ug via INTRAVENOUS

## 2012-08-30 MED ORDER — DOXERCALCIFEROL 4 MCG/2ML IV SOLN
6.0000 ug | INTRAVENOUS | Status: DC | PRN
  Administered 2012-08-30: 6 ug via INTRAVENOUS

## 2012-08-30 MED ORDER — OXYCODONE HCL 5 MG PO TABS
5.0000 mg | ORAL_TABLET | Freq: Once | ORAL | Status: AC
  Administered 2012-08-30: 5 mg via ORAL
  Filled 2012-08-30: qty 1

## 2012-08-30 MED ORDER — DOXERCALCIFEROL 4 MCG/2ML IV SOLN: 8.0000 ug | INTRAVENOUS | Status: DC

## 2012-08-30 MED ORDER — RENA-VITE PO TABS
1.0000 | ORAL_TABLET | Freq: Every day | ORAL | Status: DC
  Administered 2012-08-31: 22:00:00 via ORAL
  Filled 2012-08-30 (×2): qty 1

## 2012-08-30 MED ORDER — DEXTROSE 5 % IV SOLN
2.0000 g | Freq: Once | INTRAVENOUS | Status: DC
  Filled 2012-08-30: qty 2

## 2012-08-30 MED ORDER — NITROGLYCERIN 0.4 MG SL SUBL: 0.4000 mg | SUBLINGUAL_TABLET | SUBLINGUAL | Status: DC | PRN

## 2012-08-30 MED ORDER — IPRATROPIUM BROMIDE 0.02 % IN SOLN
0.5000 mg | Freq: Four times a day (QID) | RESPIRATORY_TRACT | Status: DC
  Administered 2012-08-30 – 2012-09-01 (×6): 0.5 mg via RESPIRATORY_TRACT
  Filled 2012-08-30 (×6): qty 2.5

## 2012-08-30 MED ORDER — ALBUTEROL SULFATE (5 MG/ML) 0.5% IN NEBU
2.5000 mg | INHALATION_SOLUTION | Freq: Four times a day (QID) | RESPIRATORY_TRACT | Status: DC
  Administered 2012-08-30 – 2012-09-01 (×6): 2.5 mg via RESPIRATORY_TRACT
  Filled 2012-08-30 (×6): qty 0.5

## 2012-08-30 MED ORDER — PROMETHAZINE HCL 25 MG PO TABS: 12.5000 mg | ORAL_TABLET | Freq: Four times a day (QID) | ORAL | Status: DC | PRN

## 2012-08-30 MED ORDER — ALBUTEROL SULFATE HFA 108 (90 BASE) MCG/ACT IN AERS: 2.0000 | INHALATION_SPRAY | RESPIRATORY_TRACT | Status: DC | PRN

## 2012-08-30 MED ORDER — SUCRALFATE 1 G PO TABS
1.0000 g | ORAL_TABLET | Freq: Two times a day (BID) | ORAL | Status: DC
  Administered 2012-08-30: 1 g via ORAL
  Filled 2012-08-30 (×2): qty 1

## 2012-08-30 MED ORDER — INSULIN ASPART 100 UNIT/ML ~~LOC~~ SOLN
0.0000 [IU] | Freq: Every day | SUBCUTANEOUS | Status: DC
  Administered 2012-08-30: 2 [IU] via SUBCUTANEOUS
  Administered 2012-08-31: 3 [IU] via SUBCUTANEOUS

## 2012-08-30 MED ORDER — DEXTROSE 5 % IV SOLN
1.0000 g | Freq: Three times a day (TID) | INTRAVENOUS | Status: DC
  Filled 2012-08-30 (×2): qty 1

## 2012-08-30 MED ORDER — INSULIN ASPART 100 UNIT/ML ~~LOC~~ SOLN
0.0000 [IU] | SUBCUTANEOUS | Status: DC
  Administered 2012-08-30 (×2): 8 [IU] via SUBCUTANEOUS

## 2012-08-30 MED ORDER — METOPROLOL TARTRATE 12.5 MG HALF TABLET
12.5000 mg | ORAL_TABLET | ORAL | Status: DC
  Administered 2012-08-31: 12.5 mg via ORAL
  Filled 2012-08-30: qty 1

## 2012-08-30 MED ORDER — DARBEPOETIN ALFA-POLYSORBATE 40 MCG/0.4ML IJ SOLN: 40.0000 ug | INTRAMUSCULAR | Status: DC

## 2012-08-30 MED ORDER — METOPROLOL TARTRATE 25 MG PO TABS: 25.0000 mg | ORAL_TABLET | ORAL | Status: DC

## 2012-08-30 MED ORDER — NICOTINE 7 MG/24HR TD PT24
7.0000 mg | MEDICATED_PATCH | TRANSDERMAL | Status: DC
  Administered 2012-08-30: 7 mg via TRANSDERMAL
  Filled 2012-08-30: qty 1

## 2012-08-30 MED ORDER — RENA-VITE PO TABS
1.0000 | ORAL_TABLET | Freq: Every day | ORAL | Status: DC
  Administered 2012-08-30: 1 via ORAL
  Filled 2012-08-30: qty 1

## 2012-08-30 MED ORDER — LEVOTHYROXINE SODIUM 150 MCG PO TABS
150.0000 ug | ORAL_TABLET | Freq: Every day | ORAL | Status: DC
  Administered 2012-08-30 – 2012-09-01 (×3): 150 ug via ORAL
  Filled 2012-08-30 (×5): qty 1

## 2012-08-30 MED ORDER — TACROLIMUS 1 MG PO CAPS
5.0000 mg | ORAL_CAPSULE | Freq: Two times a day (BID) | ORAL | Status: DC
  Administered 2012-08-30 – 2012-09-01 (×5): 5 mg via ORAL
  Filled 2012-08-30 (×6): qty 5

## 2012-08-30 MED ORDER — SENNA 8.6 MG PO TABS
1.0000 | ORAL_TABLET | Freq: Every evening | ORAL | Status: DC
  Administered 2012-08-30 – 2012-08-31 (×2): 8.6 mg via ORAL
  Filled 2012-08-30 (×3): qty 1

## 2012-08-30 MED ORDER — ALBUTEROL SULFATE HFA 108 (90 BASE) MCG/ACT IN AERS: 2.0000 | INHALATION_SPRAY | RESPIRATORY_TRACT | Status: DC

## 2012-08-30 MED ORDER — CINACALCET HCL 30 MG PO TABS
30.0000 mg | ORAL_TABLET | Freq: Every day | ORAL | Status: DC
  Administered 2012-08-30 – 2012-09-01 (×3): 30 mg via ORAL
  Filled 2012-08-30 (×4): qty 1

## 2012-08-30 MED ORDER — GABAPENTIN 300 MG PO CAPS
300.0000 mg | ORAL_CAPSULE | Freq: Every day | ORAL | Status: DC
  Filled 2012-08-30: qty 1

## 2012-08-30 MED ORDER — METOPROLOL TARTRATE 25 MG PO TABS
25.0000 mg | ORAL_TABLET | ORAL | Status: DC
  Administered 2012-08-30 – 2012-09-01 (×3): 25 mg via ORAL
  Filled 2012-08-30 (×4): qty 1

## 2012-08-30 MED ORDER — DICYCLOMINE HCL 10 MG PO CAPS
10.0000 mg | ORAL_CAPSULE | Freq: Four times a day (QID) | ORAL | Status: DC | PRN
  Filled 2012-08-30: qty 1

## 2012-08-30 NOTE — Progress Notes (Signed)
[facility] PROGRESS NOTE  [name] WGN:[id number] DOB: 1955/05/05 DOA: 08/30/2012 PCP: [name], MD 57 y.o. 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 CXR suggestive of pulm vascular congestion with some pulmonary  edema likely in the setting of HCAP with CHF exacerbations.  Assessment/Plan: Acute hypoxic resp failure Likely combination of CHF, pneumonia and COPD exacerbation. Has pulmonary  vascular congestion, pulm edema and bilateral effusion on chest x ray.  Treating empirically with vanco and cefepime. For HCAP Also likely has some COPD exacerbation. Follow cx., strep ag and legionalla ag Elevated pro BNP  appreciate renal eval. Plan for HD today. Started on IV solumedrol from COPD exacerbation. Continue  scheduled nebs.   ESRD  on HD ( Tu, th, sat). given volume overload plan for HD today. continue hectoral and sensipar Hx of renal transplant. continue prograf   CHF  as outlined above  recently admitted for CHF exacerbation. EF of 45% with grade 2 diastolic dysfunction. [facility] consulted.  continue metoprolol and ASA   DM On lantus and SSI. Monitor fsg  Hypothyroidism  continue synthroid      Code Status: FULL Family Communication:none at bedside Disposition Plan: home once stable  Consultants:  Renal   [facility]  Procedures:  none  Antibiotics:  IV vanco and cefepime  HPI/Subjective: Admission H&P reviewed. C/o cough and SOB. Reports orthopnea at home  Objective: Filed Vitals:   08/30/12 0518 08/30/12 0920  BP: 161/95 142/82  Pulse: 89 76  Temp: 97 F (36.1 C) 97.8 F (36.6 C)  TempSrc: Oral   Resp: 19 18  Height: 5\' 4"  (1.626 m)   Weight: 73.5 kg (162 lb 0.6 oz)   SpO2: 97% 90%    Intake/Output Summary (Last 24 hours) at 08/30/12 1409 Last data filed at 08/30/12 7846  Gross  per 24 hour  Intake      0 ml  Output      0 ml  Net      0 ml   Filed Weights   08/30/12 0518  Weight: 73.5 kg (162 lb 0.6 oz)    Exam:   General:  Middle aged female in NAD, appears fatigued   HEENT: no pallor, no JVD   CHEST: bibasilar crackles with diffuse scattered rhonchi  CVS: NS1&S2, no murmurs  Abd: soft, NT, ND, BS+  Musculoskeletal: warm, trace edema, left groin AV fistula  CNS: AAOX3   Data Reviewed: Basic Metabolic Panel:  Recent Labs Lab 08/30/12 0757  CREATININE 8.61*   Liver Function Tests: No results found for this basename: AST, ALT, ALKPHOS, BILITOT, PROT, ALBUMIN,  in the last 168 hours No results found for this basename: LIPASE, AMYLASE,  in the last 168 hours No results found for this basename: AMMONIA,  in the last 168 hours CBC:  Recent Labs Lab 08/30/12 0757  WBC 6.7  HGB 10.2*  HCT 30.8*  MCV 97.8  PLT 138*   Cardiac Enzymes:  Recent Labs Lab 08/30/12 0758  TROPONINI <0.30   BNP (last 3 results)  Recent Labs  07/20/12 0154 08/30/12 0758  PROBNP 42149.0* 41152.0*   CBG:  Recent Labs Lab 08/30/12 0724 08/30/12 1126  GLUCAP 257* 261*    No results found for this or any previous visit (from the past 240 hour(s)).   Studies: Dg Chest 2 View  08/30/2012   *RADIOLOGY  REPORT*  Clinical Data: Shortness of breath.  Pneumonia.  Pulmonary edema.  CHEST - 2 VIEW  Comparison: 08/30/2012 and 07/28/2012 and 07/20/2012  Findings: There are small bilateral pleural effusions.  There is increased pulmonary vascular congestion with bilateral interstitial pulmonary edema.  Heart size is within normal limits.  No osseous abnormality.  IMPRESSION: Increased pulmonary vascular congestion with recurrent interstitial pulmonary edema and new small bilateral effusions.   Original Report Authenticated By: Francene Boyers, M.D.    Scheduled Meds: . albuterol  2.5 mg Nebulization Q6H WA  . aspirin EC  81 mg Oral Daily  . calcium carbonate  3  tablet Oral QAC breakfast  . [START ON 08/31/2012] ceFEPime (MAXIPIME) IV  2 g Intravenous Q T,Th,Sat-1800  . cinacalcet  30 mg Oral Q breakfast  . darbepoetin (ARANESP) injection - DIALYSIS  40 mcg Intravenous Once  . docusate sodium  100 mg Oral BID  . DULoxetine  60 mg Oral Daily  . gabapentin  300 mg Oral Daily  . heparin  5,000 Units Subcutaneous Q8H  . insulin aspart  0-15 Units Subcutaneous Q4H  . insulin detemir  3 Units Subcutaneous Daily  . ipratropium  0.5 mg Nebulization Q6H WA  . levothyroxine  150 mcg Oral QAC breakfast  . methylPREDNISolone (SOLU-MEDROL) injection  125 mg Intramuscular Q8H  . [START ON 08/31/2012] metoprolol tartrate  25 mg Oral Custom  . metoprolol tartrate  25 mg Oral Custom  . [START ON 08/31/2012] multivitamin  1 tablet Oral QHS  . nicotine  14 mg Transdermal Daily  . pantoprazole  40 mg Oral Daily  . polyethylene glycol  17 g Oral BID  . senna  1 tablet Oral QPM  . tacrolimus  5 mg Oral BID  . vancomycin  1,500 mg Intravenous Once  . [START ON 08/31/2012] vancomycin  750 mg Intravenous Q T,Th,Sa-HD   Continuous Infusions:     Time spent:25 minutes    Angello Chien  [facility] Pager [phone]. If 7PM-7AM, please contact night-coverage at [email or web address], password [facility] 08/30/2012, 2:09 PM  LOS: 0 days

## 2012-08-30 NOTE — Consult Note (Signed)
[facility] Renal Consultation Note    Indication for Consultation:  Management of ESRD/hemodialysis; anemia, hypertension/volume and secondary hyperparathyroidism  HPI: [name] is a 57 y.o. female ESRD patient (TTS) with past medical history significant for hypertension, DM type II, CAD, COPD, mixed CHF and status post renal and liver transplants who presented to the [facility] ED with a 1 month history of SOB and productive cough.  She has been treated with several courses of oral antibiotics as an outpatient and with a hospitalization 6/17-6/27 for CHF exacerbation. Chest x-ray pulmonary vascular congestion, recurrent pulmonary edema and bilateral pleural effusions. At the time of this encounter she is sitting up in bed to facilitate her breathing and endorses extreme fatigue.  Her voice is hoarse secondary to cough and she feels that she is becoming more depressed. Nonproductive cough.  3 pillow orthop, DOE with <76ft.  Has been exercising and losing wgt prior to this.  Her last outpatient dialysis session was on Saturday as scheduled and per records, she usually meets the goal established for her dry weight.  Past Medical History  Diagnosis Date  . Liver disease     cirrhosis and this is why she had the transplant in 2011  . Hepatitis C   . Chronic back pain   . Diverticulosis   . CAD (coronary artery disease)   . Obesity   . Peripheral vascular disease hands and legs  . Anxiety   . Asthma   . Headache(784.0)     last migraine 6+yrs ago  . Arthritis     back  . GERD (gastroesophageal reflux disease)     takes Omeprazole daily  . Chronic constipation     takes MIralax and Colace daily  . Anemia     takes Folic Acid daily  . Hypothyroidism     takes Synthroid daily  . Diabetes mellitus     Levemir 2units daily if > 150  . Depression     takes Cymbalta for "severe" depression  . Pneumonia   . Neuromuscular disorder     carpal tunnel in right hand  .  Hypertension     takes Metoprolol and Lisinopril daily, sees Dr Shary Decamp  . Pneumonia   . End stage renal disease on dialysis 02/27/2011    Kidneys shut down at time of liver transplant in Sept 2011 at [facility] in [place], she has been on HD ever since.  Dialyzes at [facility] HD on TTS schedule.  Had L forearm graft used 10 months then removed Dec 2012 due to suspected infection.  A right upper arm AVG was placed Dec 2012 but she developed steal symptoms acutely and it was ligated the same day.  Never had an AV fistula due to small veins.  Now has L thigh AVG put in Jan 2013, has not clotted to date.     Past Surgical History  Procedure Laterality Date  . Liver transplant  10/25/2009    sees Dr Barnetta Hammersmith 1 every 6 months, saw last in Dec 2013. Odette Horns Coord [phone]  . Small intestine surgery  90's  . Thrombectomy    . Arteriovenous graft placement  10/03/10    Left forearm AVG  . Avgg removal  12/23/2010    Procedure: REMOVAL OF ARTERIOVENOUS GORETEX GRAFT (AVGG);  Surgeon: Sherren Kerns, MD;  Location[facility] OR;  Service: Vascular;  Laterality: Left;  procedure started @1736 -1852  . Insertion of dialysis catheter  12/23/2010    Procedure: INSERTION OF  DIALYSIS CATHETER;  Surgeon: Sherren Kerns, MD;  Location: [facility] OR;  Service: Vascular;  Laterality: Right;  Right Internal Jugular 28cm dialysis catheter insertion procedure time 1701-1720   . Cholecystectomy  1993  . Cystoscopy  15+yrs ago  . Back surgery  2010  . Av fistula placement  01/29/2011    Procedure: INSERTION OF ARTERIOVENOUS (AV) GORE-TEX GRAFT ARM;  Surgeon: Sherren Kerns, MD;  Location: [facility] OR;  Service: Vascular;  Laterality: Right;  . Tubal ligation    . Av fistula placement  03/10/2011    Procedure: INSERTION OF ARTERIOVENOUS (AV) GORE-TEX GRAFT THIGH;  Surgeon: Sherren Kerns, MD;  Location: [facility] OR;  Service: Vascular;  Laterality: Left;   Family History  Problem Relation Age of Onset  . Cancer Mother   .  Diabetes Mother   . Hypertension Mother   . Stroke Mother   . Cancer Father   . Anesthesia problems Neg Hx   . Hypotension Neg Hx   . Malignant hyperthermia Neg Hx   . Pseudochol deficiency Neg Hx    Social History:  reports that she has been smoking Cigarettes.  She has a 7.5 pack-year smoking history. She has never used smokeless tobacco. She reports that she does not drink alcohol or use illicit drugs. Allergies  Allergen Reactions  . Acetaminophen Other (See Comments)    Liver problems  . Codeine Itching   Prior to Admission medications   Medication Sig Start Date End Date Taking? Authorizing Provider  albuterol (PROVENTIL,VENTOLIN) 90 MCG/ACT inhaler Inhale 2 puffs into the lungs every 4 (four) hours as needed. For shortness of breath   Yes Historical Provider, MD  aspirin EC 81 MG tablet Take 81 mg by mouth daily.     Yes Historical Provider, MD  calcium carbonate (TUMS - DOSED IN MG ELEMENTAL CALCIUM) 500 MG chewable tablet Chew 1 tablet by mouth daily.    Yes Historical Provider, MD  cinacalcet (SENSIPAR) 30 MG tablet Take 30 mg by mouth daily.   Yes Historical Provider, MD  dicyclomine (BENTYL) 10 MG capsule Take 10 mg by mouth every 6 (six) hours as needed (for stomach pain).   Yes Historical Provider, MD  docusate sodium (COLACE) 100 MG capsule Take 100 mg by mouth 2 (two) times daily.     Yes Historical Provider, MD  doxercalciferol (HECTOROL) 4 MCG/2ML injection Inject 4 mLs (8 mcg total) into the vein Every Tuesday,Thursday,and Saturday with dialysis. 07/30/12  Yes Luke K Kilroy, PA-C  DULoxetine (CYMBALTA) 60 MG capsule Take 60 mg by mouth daily.     Yes Historical Provider, MD  gabapentin (NEURONTIN) 300 MG capsule Take 300 mg by mouth daily.   Yes Historical Provider, MD  insulin detemir (LEVEMIR) 100 UNIT/ML injection Inject 5 Units into the skin at bedtime as needed. Uses only if blood sugar levels are over 150 12/25/11  Yes Rhetta Mura, MD  levothyroxine  (SYNTHROID, LEVOTHROID) 150 MCG tablet Take 150 mcg by mouth daily before breakfast.   Yes Historical Provider, MD  lisinopril (PRINIVIL,ZESTRIL) 5 MG tablet Take 5 mg by mouth daily. Daily at lunch   Yes Historical Provider, MD  metoprolol tartrate (LOPRESSOR) 25 MG tablet Take 25 mg by mouth See admin instructions. Take 1 tablet at bedtime on dialysis days-Tues,Thurs sat. Takes 1 tablet twice daily on Mon,wednesday, friday and sunday 12/25/11  Yes Rhetta Mura, MD  multivitamin (RENA-VIT) TABS tablet Take 1 tablet by mouth daily. 07/30/12  Yes Abelino Derrick, PA-C  nitroGLYCERIN (NITROSTAT) 0.4 MG SL tablet Place 1 tablet (0.4 mg total) under the tongue every 5 (five) minutes as needed for chest pain (for severe chest pressure, tightness). 07/30/12  Yes Luke K Kilroy, PA-C  omeprazole (PRILOSEC) 20 MG capsule Take 20 mg by mouth as needed (acid reflux).   Yes Historical Provider, MD  polyethylene glycol (MIRALAX / GLYCOLAX) packet Take 17 g by mouth as needed (constipation).   Yes Historical Provider, MD  promethazine (PHENERGAN) 12.5 MG tablet Take 12.5 mg by mouth every 6 (six) hours as needed. For nausea    Yes Historical Provider, MD  sucralfate (CARAFATE) 1 G tablet Take 1 g by mouth daily.   Yes Historical Provider, MD  tacrolimus (PROGRAF) 1 MG capsule Take 5 mg by mouth 2 (two) times daily.    Yes Historical Provider, MD  traMADol (ULTRAM) 50 MG tablet Take 50 mg by mouth every 6 (six) hours as needed. pain   Yes Historical Provider, MD  nicotine (NICODERM CQ - DOSED IN MG/24 HR) 7 mg/24hr patch Place 1 patch onto the skin daily. 07/30/12   Abelino Derrick, PA-C   Current Facility-Administered Medications  Medication Dose Route Frequency Provider Last Rate Last Dose  . albuterol (PROVENTIL HFA;VENTOLIN HFA) 108 (90 BASE) MCG/ACT inhaler 2 puff  2 puff Inhalation Q4H Nishant Dhungel, MD      . aspirin EC tablet 81 mg  81 mg Oral Daily Doree Albee, MD   81 mg at 08/30/12 0957  . calcium  carbonate (TUMS - dosed in mg elemental calcium) chewable tablet 200 mg of elemental calcium  1 tablet Oral Daily Doree Albee, MD   200 mg of elemental calcium at 08/30/12 0953  . [START ON 08/31/2012] ceFEPIme (MAXIPIME) 2 g in dextrose 5 % 50 mL IVPB  2 g Intravenous Q T,Th,Sat-1800 Colleen Can, [facility]      . cinacalcet (SENSIPAR) tablet 30 mg  30 mg Oral Q breakfast Doree Albee, MD   30 mg at 08/30/12 0953  . dicyclomine (BENTYL) capsule 10 mg  10 mg Oral Q6H PRN Doree Albee, MD      . docusate sodium (COLACE) capsule 100 mg  100 mg Oral BID Doree Albee, MD   100 mg at 08/30/12 1006  . [START ON 08/31/2012] doxercalciferol (HECTOROL) injection 8 mcg  8 mcg Intravenous Q T,Th,Sa-HD Doree Albee, MD      . DULoxetine (CYMBALTA) DR capsule 60 mg  60 mg Oral Daily Doree Albee, MD      . Melene Muller ON 09/02/2012] ferric gluconate (NULECIT) 62.5 mg in sodium chloride 0.9 % 100 mL IVPB  62.5 mg Intravenous Q Thu-HD Doree Albee, MD      . gabapentin (NEURONTIN) capsule 300 mg  300 mg Oral Daily Doree Albee, MD      . heparin injection 5,000 Units  5,000 Units Subcutaneous Q8H Doree Albee, MD      . insulin aspart (novoLOG) injection 0-15 Units  0-15 Units Subcutaneous Q4H Doree Albee, MD   8 Units at 08/30/12 1006  . insulin detemir (LEVEMIR) injection 3 Units  3 Units Subcutaneous Daily Doree Albee, MD   3 Units at 08/30/12 0957  . ipratropium (ATROVENT) nebulizer solution 0.5 mg  0.5 mg Nebulization Q6H Nishant Dhungel, MD      . levothyroxine (SYNTHROID, LEVOTHROID) tablet 150 mcg  150 mcg Oral QAC breakfast Doree Albee, MD   150 mcg at 08/30/12 0952  . lisinopril (PRINIVIL,ZESTRIL) tablet 5 mg  5 mg  Oral Q1200 Doree Albee, MD   5 mg at 08/30/12 0954  . methylPREDNISolone sodium succinate (SOLU-MEDROL) 125 mg/2 mL injection 125 mg  125 mg Intramuscular Q8H Doree Albee, MD      . Melene Muller ON 08/31/2012] metoprolol tartrate (LOPRESSOR) tablet 25 mg  25 mg Oral Custom Lynden Oxford,  MD      . metoprolol tartrate (LOPRESSOR) tablet 25 mg  25 mg Oral Custom Lynden Oxford, MD   25 mg at 08/30/12 0952  . multivitamin (RENA-VIT) tablet 1 tablet  1 tablet Oral Daily Doree Albee, MD   1 tablet at 08/30/12 [phone]  . nicotine (NICODERM CQ - dosed in mg/24 hours) patch 14 mg  14 mg Transdermal Daily Nishant Dhungel, MD      . nitroGLYCERIN (NITROSTAT) SL tablet 0.4 mg  0.4 mg Sublingual Q5 min PRN Doree Albee, MD      . pantoprazole (PROTONIX) EC tablet 40 mg  40 mg Oral Daily Doree Albee, MD   40 mg at 08/30/12 1006  . polyethylene glycol (MIRALAX / GLYCOLAX) packet 17 g  17 g Oral BID Doree Albee, MD   17 g at 08/30/12 9604  . promethazine (PHENERGAN) tablet 12.5 mg  12.5 mg Oral Q6H PRN Doree Albee, MD      . senna [facility]) tablet 8.6 mg  1 tablet Oral QPM Doree Albee, MD      . sucralfate (CARAFATE) tablet 1 g  1 g Oral BID Doree Albee, MD   1 g at 08/30/12 [phone]  . tacrolimus (PROGRAF) capsule 5 mg  5 mg Oral BID Doree Albee, MD   5 mg at 08/30/12 0956  . traMADol (ULTRAM) tablet 50 mg  50 mg Oral Q6H PRN Doree Albee, MD      . vancomycin (VANCOCIN) 1,500 mg in sodium chloride 0.9 % 500 mL IVPB  1,500 mg Intravenous Once Colleen Can, [facility]      . [START ON 08/31/2012] vancomycin (VANCOCIN) IVPB 750 mg/150 ml premix  750 mg Intravenous Q T,Th,Sa-HD Colleen Can, [facility]       Labs: Basic Metabolic Panel:  Recent Labs Lab 08/30/12 0757  CREATININE 8.61*   CBC:  Recent Labs Lab 08/30/12 0757  WBC 6.7  HGB 10.2*  HCT 30.8*  MCV 97.8  PLT 138*   Cardiac Enzymes:  Recent Labs Lab 08/30/12 0758  TROPONINI <0.30   CBG:  Recent Labs Lab 08/30/12 0724  GLUCAP 257*   Studies/Results: Dg Chest 2 View  08/30/2012   *RADIOLOGY REPORT*  Clinical Data: Shortness of breath.  Pneumonia.  Pulmonary edema.  CHEST - 2 VIEW  Comparison: 08/30/2012 and 07/28/2012 and 07/20/2012  Findings: There are small bilateral pleural effusions.  There is  increased pulmonary vascular congestion with bilateral interstitial pulmonary edema.  Heart size is within normal limits.  No osseous abnormality.  IMPRESSION: Increased pulmonary vascular congestion with recurrent interstitial pulmonary edema and new small bilateral effusions.   Original Report Authenticated By: Francene Boyers, M.D.    ROS: Tired, hoarse. All other systems negative except at above   Physical Exam: Filed Vitals:   08/30/12 0518 08/30/12 0920  BP: 161/95 142/82  Pulse: 89 76  Temp: 97 F (36.1 C) 97.8 F (36.6 C)  TempSrc: Oral   Resp: 19 18  Height: 5\' 4"  (1.626 m)   Weight: 73.5 kg (162 lb 0.6 oz)   SpO2: 97% 90%     General: Drowsy, but easily aroused, hoarse, chronically ill-appearing, NAD Head: Normocephalic, atraumatic,  sclera non-icteric, mucus membranes are moist fundi benign Neck: Supple. JVD to angle of Jaw at 45 degrees Lungs: Diffuse crackles, faint wheezes. No rales, or rhonchi. Breathing is unlabored. Heart: RRR with S1 S2. No murmurs, rubs, or gallops appreciated. Gr 2/6 sem,, Abdomen: Soft, non-tender, non-distended with normoactive bowel sounds. No rebound/guarding. No obvious abdominal masses. Liver down 4 cm M-S:  Strength and tone appear normal for age. Lower extremities: Trace LE edema. No ischemic changes, no open wounds  Neuro: Alert and oriented X 3. Moves all extremities spontaneously. Psych:  Depressed mood. Responds to questions appropriately with a normal affect. Dialysis Access: L thigh AVG +bruit  Dialysis Orders: Center: [facility]  on TTS . Optiflux 160 EDW 71.5 HD Bath 2K/2.25Ca  Time 4:00 Heparin NONE. Access L thigh AVG BFR 400 DFR A1.5    Hectorol 6 mcg IV/HD Epogen 2000  Units IV/HD  Venofer  50 q week  Recent labs: Hgb 10.9, Tsat 44%, Ferritin 781 in April, Phos 9.0, PTH 148.3  Assessment/Plan: 1. Respiratory failure - Per admit. Likely multifactorial with PNA, COPD and possible CHF contributing. Afebrile. Si Raider. On Supp O2. Empiric  Vanc/cefepime for HCAP.  2. ESRD -  TTS, K+ 4.5 on 4/19. HD today for volume excess 3. Hypertension/volume  - SBPs 140s-160s on home meds metoprolol and lisinopril. Makes EDW as op. Up ~ 2kg by wgts here. Pulm vasc congestion, pulm edema and bilateral effusions on CXR. Try for UF 4L today. HD possibly again tomorrow for volume removal and to get back on schedule. Will need lower EDW at d/c. 4. Anemia  - Hgb 10.2 on op Epo. Aranesp 40 here. Last op Tsat 44% on 6/26.[name] Hold weekly Fe for now and recheck Ferritin 5. Metabolic bone disease -  Ca controlled op. Last op phos elevated at 9.0 on Tums 1 ac. (? Compliance vs need for alternate binder) Will increase dose and monitor while here. PTH within range as op. Continue hectorol, sensipar. Renal panel 6. Nutrition - Renal diet, multivitamin 7. COPD - Per admit. Recent admit for exacerbation 6/17-6/27. Still smoking.  8. Hypothyroidism - on synthroid per admit 9. DM Type II  - on insulin per admit 10. Depression - on Cymbalta per admit. Worsening sx per pt. Consider psych consult once #3 above resolved and if pt still admitted. Other wise can f/u op. No SI/HI.  11. Hx renal and liver transplant - on immunosuppressants per admit. LFTs pending   Claud Kelp, Cordelia Poche [facility] Pager [phone] 08/30/2012, 10:13 AM  I have seen and examined this patient and agree with the plan of care  Seen, eval, examined, and counseled. Will do HD next round.  Sx of vol xs. Has lost LBM . Other issues to address also. [name]  Clare Fennimore L 08/30/2012, 12:45 PM 2

## 2012-08-30 NOTE — Consult Note (Signed)
Reason for Consult: CHF Referring Physician:   TUWANA KAPAUN is an 57 y.o. female.  HPI:   57 y/o sees a cardiologist in [place] and is followed by the renal service for ESRD(on HD T/T/S).  Her history includes tobacco abuse, liver transplant 2011, coronary artery disease, obesity, peripheral vascular disease, asthma, anxiety, diabetes mellitus, hypertension, hyperlipidemia, severe AS. Patient was hospitalized in June 2014 with CHF exacerbation. She had cardiac catheterization on 07/29/2012 which showed moderate LAD disease with the 40-50% LAD and mild aortic stenosis, however, 2-D echocardiogram revealed an EF of 45% to 50% and calculated aortic valve area of 0.7-0.8 cm consistent with severe aortic stenosis.  She was discharged at that time to follow up with her cardiologist in [place].  Patient presents today with nausea, cough and sputum production, chills, night sweats, SOB, orthopnea.  She also reports intermittent CP, LEE, ABD pain.  The patient currently denies vomiting, fever,  dizziness, PND, hematochezia, melena, lower extremity edema.  She has not followed up with her cardiologist in [place] as she was instructed at discharge in June.    She also eats a lot of salt.     Past Medical History  Diagnosis Date  . Liver disease     cirrhosis and this is why she had the transplant in 2011  . Hepatitis C   . Chronic back pain   . Diverticulosis   . CAD (coronary artery disease)   . Obesity   . Peripheral vascular disease hands and legs  . Anxiety   . Asthma   . Headache(784.0)     last migraine 6+yrs ago  . Arthritis     back  . GERD (gastroesophageal reflux disease)     takes Omeprazole daily  . Chronic constipation     takes MIralax and Colace daily  . Anemia     takes Folic Acid daily  . Hypothyroidism     takes Synthroid daily  . Diabetes mellitus     Levemir 2units daily if > 150  . Depression     takes Cymbalta for "severe" depression  . Pneumonia   .  Neuromuscular disorder     carpal tunnel in right hand  . Hypertension     takes Metoprolol and Lisinopril daily, sees Dr Shary Decamp  . Pneumonia   . End stage renal disease on dialysis 02/27/2011    Kidneys shut down at time of liver transplant in Sept 2011 at [facility] in [place], she has been on HD ever since.  Dialyzes at [facility] HD on TTS schedule.  Had L forearm graft used 10 months then removed Dec 2012 due to suspected infection.  A right upper arm AVG was placed Dec 2012 but she developed steal symptoms acutely and it was ligated the same day.  Never had an AV fistula due to small veins.  Now has L thigh AVG put in Jan 2013, has not clotted to date.      Past Surgical History  Procedure Laterality Date  . Liver transplant  10/25/2009    sees Dr Barnetta Hammersmith 1 every 6 months, saw last in Dec 2013. Odette Horns Coord [phone]  . Small intestine surgery  90's  . Thrombectomy    . Arteriovenous graft placement  10/03/10    Left forearm AVG  . Avgg removal  12/23/2010    Procedure: REMOVAL OF ARTERIOVENOUS GORETEX GRAFT (AVGG);  Surgeon: Sherren Kerns, MD;  Location: [facility] OR;  Service: Vascular;  Laterality: Left;  procedure started @  1610-9604  . Insertion of dialysis catheter  12/23/2010    Procedure: INSERTION OF DIALYSIS CATHETER;  Surgeon: Sherren Kerns, MD;  Location: [facility] OR;  Service: Vascular;  Laterality: Right;  Right Internal Jugular 28cm dialysis catheter insertion procedure time 1701-1720   . Cholecystectomy  1993  . Cystoscopy  15+yrs ago  . Back surgery  2010  . Av fistula placement  01/29/2011    Procedure: INSERTION OF ARTERIOVENOUS (AV) GORE-TEX GRAFT ARM;  Surgeon: Sherren Kerns, MD;  Location: [facility] OR;  Service: Vascular;  Laterality: Right;  . Tubal ligation    . Av fistula placement  03/10/2011    Procedure: INSERTION OF ARTERIOVENOUS (AV) GORE-TEX GRAFT THIGH;  Surgeon: Sherren Kerns, MD;  Location: [facility] OR;  Service: Vascular;  Laterality: Left;    Family  History  Problem Relation Age of Onset  . Cancer Mother   . Diabetes Mother   . Hypertension Mother   . Stroke Mother   . Cancer Father   . Anesthesia problems Neg Hx   . Hypotension Neg Hx   . Malignant hyperthermia Neg Hx   . Pseudochol deficiency Neg Hx     Social History:  reports that she has been smoking Cigarettes.  She has a 7.5 pack-year smoking history. She has never used smokeless tobacco. She reports that she does not drink alcohol or use illicit drugs.  Allergies:  Allergies  Allergen Reactions  . Acetaminophen Other (See Comments)    Liver problems  . Codeine Itching    Medications:  Scheduled Meds: . albuterol  2.5 mg Nebulization Q6H WA  . aspirin EC  81 mg Oral Daily  . calcium carbonate  3 tablet Oral QAC breakfast  . [START ON 08/31/2012] ceFEPime (MAXIPIME) IV  2 g Intravenous Q T,Th,Sat-1800  . cinacalcet  30 mg Oral Q breakfast  . darbepoetin (ARANESP) injection - DIALYSIS  40 mcg Intravenous Once  . docusate sodium  100 mg Oral BID  . DULoxetine  60 mg Oral Daily  . gabapentin  300 mg Oral Daily  . heparin  5,000 Units Subcutaneous Q8H  . insulin aspart  0-15 Units Subcutaneous Q4H  . insulin detemir  3 Units Subcutaneous Daily  . ipratropium  0.5 mg Nebulization Q6H WA  . levothyroxine  150 mcg Oral QAC breakfast  . methylPREDNISolone (SOLU-MEDROL) injection  125 mg Intramuscular Q8H  . [START ON 08/31/2012] metoprolol tartrate  25 mg Oral Custom  . metoprolol tartrate  25 mg Oral Custom  . [START ON 08/31/2012] multivitamin  1 tablet Oral QHS  . nicotine  14 mg Transdermal Daily  . pantoprazole  40 mg Oral Daily  . polyethylene glycol  17 g Oral BID  . senna  1 tablet Oral QPM  . tacrolimus  5 mg Oral BID  . vancomycin  1,500 mg Intravenous Once  . [START ON 08/31/2012] vancomycin  750 mg Intravenous Q T,Th,Sa-HD   Continuous Infusions:  PRN Meds:.dicyclomine, nitroGLYCERIN, promethazine, traMADol   Results for orders placed during the  hospital encounter of 08/30/12 (from the past 48 hour(s))  GLUCOSE, CAPILLARY     Status: Abnormal   Collection Time    08/30/12  7:24 AM      Result Value Range   Glucose-Capillary 257 (*) 70 - 99 mg/dL  CBC     Status: Abnormal   Collection Time    08/30/12  7:57 AM      Result Value Range   WBC 6.7  4.0 -  10.5 K/uL   RBC 3.15 (*) 3.87 - 5.11 MIL/uL   Hemoglobin 10.2 (*) 12.0 - 15.0 g/dL   HCT 45.4 (*) 09.8 - 11.9 %   MCV 97.8  78.0 - 100.0 fL   MCH 32.4  26.0 - 34.0 pg   MCHC 33.1  30.0 - 36.0 g/dL   RDW 14.7 (*) 82.9 - 56.2 %   Platelets 138 (*) 150 - 400 K/uL  CREATININE, SERUM     Status: Abnormal   Collection Time    08/30/12  7:57 AM      Result Value Range   Creatinine, Ser 8.61 (*) 0.50 - 1.10 mg/dL   GFR calc non Af Amer 5 (*) >90 mL/min   GFR calc Af Amer 5 (*) >90 mL/min   Comment:            The eGFR has been calculated     using the CKD EPI equation.     This calculation has not been     validated in all clinical     situations.     eGFR's persistently     <90 mL/min signify     possible Chronic Kidney Disease.  PRO B NATRIURETIC PEPTIDE     Status: Abnormal   Collection Time    08/30/12  7:58 AM      Result Value Range   Pro B Natriuretic peptide (BNP) 41152.0 (*) 0 - 125 pg/mL  TROPONIN I     Status: None   Collection Time    08/30/12  7:58 AM      Result Value Range   Troponin I <0.30  <0.30 ng/mL   Comment:            Due to the release kinetics of cTnI,     a negative result within the first hours     of the onset of symptoms does not rule out     myocardial infarction with certainty.     If myocardial infarction is still suspected,     repeat the test at appropriate intervals.  GLUCOSE, CAPILLARY     Status: Abnormal   Collection Time    08/30/12 11:26 AM      Result Value Range   Glucose-Capillary 261 (*) 70 - 99 mg/dL    Dg Chest 2 View  03/12/8655   *RADIOLOGY REPORT*  Clinical Data: Shortness of breath.  Pneumonia.  Pulmonary edema.   CHEST - 2 VIEW  Comparison: 08/30/2012 and 07/28/2012 and 07/20/2012  Findings: There are small bilateral pleural effusions.  There is increased pulmonary vascular congestion with bilateral interstitial pulmonary edema.  Heart size is within normal limits.  No osseous abnormality.  IMPRESSION: Increased pulmonary vascular congestion with recurrent interstitial pulmonary edema and new small bilateral effusions.   Original Report Authenticated By: Francene Boyers, M.D.    Review of Systems  Constitutional: Positive for chills and diaphoresis. Negative for fever.  HENT: Positive for congestion.   Respiratory: Positive for cough, sputum production (Yellow) and shortness of breath.   Cardiovascular: Positive for chest pain (Ocassionally ), orthopnea and leg swelling (Sometimes).  Gastrointestinal: Positive for nausea. Negative for vomiting, abdominal pain, blood in stool and melena.  Genitourinary: Negative for dysuria and hematuria.  Neurological: Negative for dizziness.  All other systems reviewed and are negative.   Blood pressure 142/82, pulse 76, temperature 97.8 F (36.6 C), temperature source Oral, resp. rate 18, height 5\' 4"  (1.626 m), weight 162 lb 0.6 oz (73.5 kg), SpO2 90.00%.  Physical Exam  Nursing note and vitals reviewed. Constitutional: She is oriented to person, place, and time. She appears well-nourished. No distress.  Overweight  HENT:  Head: Normocephalic and atraumatic.  Eyes: EOM are normal. Pupils are equal, round, and reactive to light. No scleral icterus.  Neck: Normal range of motion. Neck supple. JVD present.  Cardiovascular: Normal rate, regular rhythm, S1 normal and S2 normal.   Murmur heard.  Systolic murmur is present with a grade of 3/6  Pulses:      Radial pulses are 2+ on the right side, and 2+ on the left side.       Dorsalis pedis pulses are 2+ on the right side, and 2+ on the left side.  Musical MM loudest at apex. No carotid bruits.  Respiratory: Effort  normal. She has no wheezes. She has rales (minimal).  GI: Soft. Bowel sounds are normal. There is tenderness (RUQ).  Musculoskeletal: She exhibits no edema.  Lymphadenopathy:    She has no cervical adenopathy.  Neurological: She is alert and oriented to person, place, and time. She exhibits normal muscle tone.  Skin: Skin is warm and dry.  Psychiatric: She has a normal mood and affect.    Assessment/Plan: Active Problems:   CAD- moderate with 40% - 50% LAD 07/29/12   Aortic stenosis, severe by 2D ehco 07/2012.  AVA 0.76 cm^2    HYPOTHYROIDISM   DIABETES MELLITUS, TYPE II   End stage renal disease on dialysis   Acute combined systolic and diastolic heart failure   Dyslipidemia-LDL 104, not on statin with Hx of liver transplant   PNA  Plan:   Condition appears multifactorial; PNA,  A on C sys/dias CHF, severe AS, COPD.  JVD does appear elevated with orthopnea, and BNP is 41K in the setting of ESRD.  CXR confirms vascular congestion, pulm edema and bilateral effusions.  She needs volume reduction during HD.  We discussed sodium restriction.  ? Need for CTS consult for severe AS.  HAGER, BRYAN 08/30/2012, 2:11 PM    I have seen and examined the patient along with Wilburt Finlay, PA.  I have reviewed the chart, notes and new data.  I agree with PA's note.  Key new complaints: biventricular HF/hypervolemia Key examination changes: right and left heart failure findings; early peaking aortic murmur Key new findings / data: I have reviewed the echo and the cath data  I do not think that she has severe AS, but rather moderate AS. The aortic ejection jet is early peaking on echo and the gradients are only moderately elevated. The aortic valve area calculation is flawed due to erroneous placement of the LVOT pulsed wave sample volume, with underestimation of the stroke volume on echo. Having said that, the valve is severely diseased and calcified and will likely progress to severe AS sooner in a  patient with ESRD.  PLAN: Volume removal with dialysis. It is not premature for surgical evaluation, but she may not yet need surgery. CHF may be controllable using HD/medical management. Try to challenge dry weight.  Thurmon Fair, MD, [facility] [phone] 08/30/2012, 4:24 PM

## 2012-08-30 NOTE — Evaluation (Signed)
Physical Therapy Evaluation Patient Details Name: [name] MRN: [id number] DOB: [birth year] Today's Date: 08/30/2012 Time: 1358-1410 PT Time Calculation (min): 12 min  PT Assessment / Plan / Recommendation History of Present Illness  57 y.o. 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 CXR suggestive of pulm vascular congestion with some pulmonary  edema likely in the setting of HCAP with CHF exacerbations.   Clinical Impression  Pt admitted for acute hypoxic respiratory failure.  Pt currently with functional limitations due to the deficits listed below (see PT Problem List).  Pt will benefit from skilled PT to increase their independence and safety with mobility to allow discharge to the venue listed below.      PT Assessment  Patient needs continued PT services    Follow Up Recommendations  Home health PT;Supervision for mobility/OOB    Does the patient have the potential to tolerate intense rehabilitation      Barriers to Discharge        Equipment Recommendations  None recommended by PT    Recommendations for Other Services     Frequency Min 3X/week    Precautions / Restrictions Precautions Precautions: Fall   Pertinent Vitals/Pain See mobility      Mobility  Bed Mobility Bed Mobility: Supine to Sit;Sit to Supine Supine to Sit: 6: Modified independent (Device/Increase time) Sit to Supine: 6: Modified independent (Device/Increase time) Transfers Transfers: Sit to Stand;Stand to Sit Sit to Stand: 4: Min guard;With upper extremity assist;From bed Stand to Sit: With upper extremity assist;To bed Details for Transfer Assistance: min/guard for safety, unsteady with rise however self corrected Ambulation/Gait Ambulation/Gait Assistance: 4: Min assist Ambulation Distance (Feet): 200 Feet Assistive device:  Rolling walker Ambulation/Gait Assistance Details: assist for LOB, one instance of LE buckling end of ambulation, pt reports unsteady gait lately ambulating to neighbors house and neighbor offered to walk her home, encouraged use of RW for safety upon d/c Gait Pattern: Step-through pattern;Decreased stride length;Trunk flexed Gait velocity: decreased General Gait Details: SaO2 >95% room air at rest, during ambulation, postambulation    Exercises     PT Diagnosis: Difficulty walking  PT Problem List: Decreased strength;Decreased activity tolerance;Decreased mobility;Decreased knowledge of use of DME;Decreased balance PT Treatment Interventions: DME instruction;Gait training;Functional mobility training;Therapeutic activities;Stair training;Therapeutic exercise;Patient/family education;Balance training;Neuromuscular re-education     PT Goals(Current goals can be found in the care plan section) Acute Rehab PT Goals PT Goal Formulation: With patient Time For Goal Achievement: 09/06/12 Potential to Achieve Goals: Good  Visit Information  Last PT Received On: 08/30/12 Assistance Needed: +1 History of Present Illness: 57 y.o. 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 CXR suggestive of pulm vascular congestion with some pulmonary  edema likely in the setting of HCAP with CHF exacerbations.       Prior Functioning  Home Living Family/patient expects to be discharged to:: Private residence Living Arrangements: Children Type of Home: House Home Access: Stairs to enter Secretary/administrator of Steps: 3 Entrance Stairs-Rails: Right Home Layout: One level Home Equipment: Environmental consultant - 2 wheels;Cane - single point Prior Function Level of Independence: Independent Communication Communication: No difficulties    Cognition   Cognition Arousal/Alertness: Awake/alert Behavior During Therapy: [name]  for tasks assessed/performed Overall Cognitive Status: Within Functional Limits for tasks assessed    Extremity/Trunk Assessment Lower Extremity Assessment Lower Extremity Assessment: Generalized weakness   Balance    End of Session PT - End of Session Activity Tolerance: Patient limited by fatigue Patient left: in bed;with call bell/phone within reach;with family/visitor present  GP     [name],[name] 08/30/2012, 4:07 PM Zenovia Jarred, PT, DPT 08/30/2012 Pager: [phone]

## 2012-08-30 NOTE — Procedures (Signed)
I was present at this session.  I have reviewed the session itself and made appropriate changes.  HD via L groin AVG, vol xs.  [name] L 7/21/20142:21 PM

## 2012-08-30 NOTE — Progress Notes (Addendum)
ANTIBIOTIC CONSULT NOTE - INITIAL  Pharmacy Consult for vancomycin and cefepime Indication: rule out pneumonia  Allergies  Allergen Reactions  . Acetaminophen Other (See Comments)    Liver problems  . Codeine Itching    Patient Measurements: Height: 5\' 4"  (162.6 cm) Weight: 162 lb 0.6 oz (73.5 kg) IBW/kg (Calculated) : 54.7  Vital Signs: Temp: 97 F (36.1 C) (07/21 0518) Temp src: Oral (07/21 0518) BP: 161/95 mmHg (07/21 0518) Pulse Rate: 89 (07/21 0518)   Microbiology: No results found for this or any previous visit (from the past 720 hour(s)).  Medical History: Past Medical History  Diagnosis Date  . Liver disease     cirrhosis and this is why she had the transplant in 2011  . Hepatitis C   . Chronic back pain   . Diverticulosis   . CAD (coronary artery disease)   . Obesity   . Peripheral vascular disease hands and legs  . Anxiety   . Asthma   . Headache(784.0)     last migraine 6+yrs ago  . Arthritis     back  . GERD (gastroesophageal reflux disease)     takes Omeprazole daily  . Chronic constipation     takes MIralax and Colace daily  . Anemia     takes Folic Acid daily  . Hypothyroidism     takes Synthroid daily  . Diabetes mellitus     Levemir 2units daily if > 150  . Depression     takes Cymbalta for "severe" depression  . Pneumonia   . Neuromuscular disorder     carpal tunnel in right hand  . Hypertension     takes Metoprolol and Lisinopril daily, sees Dr Shary Decamp  . Pneumonia   . End stage renal disease on dialysis 02/27/2011    Kidneys shut down at time of liver transplant in Sept 2011 at [facility] in [place], she has been on HD ever since.  Dialyzes at [facility] HD on TTS schedule.  Had L forearm graft used 10 months then removed Dec 2012 due to suspected infection.  A right upper arm AVG was placed Dec 2012 but she developed steal symptoms acutely and it was ligated the same day.  Never had an AV fistula due to small veins.  Now has L thigh AVG  put in Jan 2013, has not clotted to date.      Medications:  Prescriptions prior to admission  Medication Sig Dispense Refill  . albuterol (PROVENTIL,VENTOLIN) 90 MCG/ACT inhaler Inhale 2 puffs into the lungs every 4 (four) hours as needed. For shortness of breath      . aspirin EC 81 MG tablet Take 81 mg by mouth daily.        . calcium carbonate (TUMS - DOSED IN MG ELEMENTAL CALCIUM) 500 MG chewable tablet Chew 1 tablet by mouth daily.       . cinacalcet (SENSIPAR) 30 MG tablet Take 30 mg by mouth daily.      [name] dicyclomine (BENTYL) 10 MG capsule Take 10 mg by mouth every 6 (six) hours as needed (for stomach pain).      [name] docusate sodium (COLACE) 100 MG capsule Take 100 mg by mouth 2 (two) times daily.        [name] doxercalciferol (HECTOROL) 4 MCG/2ML injection Inject 4 mLs (8 mcg total) into the vein Every Tuesday,Thursday,and Saturday with dialysis.  2 mL    . DULoxetine (CYMBALTA) 60 MG capsule Take 60 mg by mouth daily.        [name]  gabapentin (NEURONTIN) 300 MG capsule Take 300 mg by mouth daily.      . insulin detemir (LEVEMIR) 100 UNIT/ML injection Inject 5 Units into the skin at bedtime as needed. Uses only if blood sugar levels are over 150      . levothyroxine (SYNTHROID, LEVOTHROID) 150 MCG tablet Take 150 mcg by mouth daily before breakfast.      . lisinopril (PRINIVIL,ZESTRIL) 5 MG tablet Take 5 mg by mouth daily. Daily at lunch      . metoprolol tartrate (LOPRESSOR) 25 MG tablet Take 25 mg by mouth See admin instructions. Take 1 tablet at bedtime on dialysis days-Tues,Thurs sat. Takes 1 tablet twice daily on Mon,wednesday, friday and sunday      . multivitamin (RENA-VIT) TABS tablet Take 1 tablet by mouth daily.    0  . nicotine (NICODERM CQ - DOSED IN MG/24 HR) 7 mg/24hr patch Place 1 patch onto the skin daily.  28 patch  0  . nitroGLYCERIN (NITROSTAT) 0.4 MG SL tablet Place 1 tablet (0.4 mg total) under the tongue every 5 (five) minutes as needed for chest pain (for severe chest  pressure, tightness).  25 tablet  2  . omeprazole (PRILOSEC) 20 MG capsule Take 20 mg by mouth daily.       . polyethylene glycol (MIRALAX / GLYCOLAX) packet Take 17 g by mouth 2 (two) times daily. For constipation      . promethazine (PHENERGAN) 12.5 MG tablet Take 12.5 mg by mouth every 6 (six) hours as needed. For nausea       . senna (SENOKOT) 8.6 MG tablet Take 1 tablet by mouth every evening.       . sodium chloride 0.9 % SOLN 100 mL with ferric gluconate 12.5 MG/ML SOLN 62.5 mg Inject 62.5 mg into the vein every Thursday with hemodialysis.      [name] sucralfate (CARAFATE) 1 G tablet Take 1 g by mouth 2 (two) times daily.        . tacrolimus (PROGRAF) 1 MG capsule Take 5 mg by mouth 2 (two) times daily.       . traMADol (ULTRAM) 50 MG tablet Take 50 mg by mouth every 6 (six) hours as needed. pain       Scheduled:  . aspirin EC  81 mg Oral Daily  . calcium carbonate  1 tablet Oral Daily  . [START ON 08/31/2012] ceFEPime (MAXIPIME) IV  2 g Intravenous Q T,Th,Sat-1800  . cinacalcet  30 mg Oral Q breakfast  . docusate sodium  100 mg Oral BID  . [START ON 08/31/2012] doxercalciferol  8 mcg Intravenous Q T,Th,Sa-HD  . DULoxetine  60 mg Oral Daily  . [START ON 09/02/2012] ferric gluconate (FERRLECIT/NULECIT) IV  62.5 mg Intravenous Q Thu-HD  . gabapentin  300 mg Oral Daily  . heparin  5,000 Units Subcutaneous Q8H  . insulin detemir  3 Units Subcutaneous Daily  . levothyroxine  150 mcg Oral QAC breakfast  . lisinopril  5 mg Oral Q1200  . methylPREDNISolone (SOLU-MEDROL) injection  125 mg Intramuscular Q8H  . [START ON 08/31/2012] metoprolol tartrate  25 mg Oral Custom  . metoprolol tartrate  25 mg Oral Custom  . multivitamin  1 tablet Oral Daily  . nicotine  7 mg Transdermal Q24H  . pantoprazole  40 mg Oral Daily  . polyethylene glycol  17 g Oral BID  . senna  1 tablet Oral QPM  . sucralfate  1 g Oral BID  . tacrolimus  5  mg Oral BID     Assessment: 57yo female c/o progressive cough and  SOB x25mo, cough seemed to worsen after HD on Saturday, presented to [facility] where CXR concerning for PNA, to begin IV ABX.  Goal of Therapy:  Pre-HD vanc level 15-25  Plan:  Will give vancomycin 1500mg  IV x1 now then vanc 750mg  IV after each HD and monitor CBC, Cx, levels prn; will also change cefepime to 2g IV TTS.  Vernard Gambles, PharmD, BCPS  08/30/2012,6:26 AM

## 2012-08-30 NOTE — Progress Notes (Signed)
Hemodialysis-Spoke with pharmacy. Will give vancomycin loading dose with HD today per pharm.

## 2012-08-30 NOTE — H&P (Signed)
Hospitalist Admission History and Physical  Patient name: [name] Medical record number: [id number] Date of birth: [birth year] Age: 57 y.o. Gender: female  Primary Care Provider: [name], MD  Chief Complaint: cough, SOB, PNA  History of Present Illness:This is a 57 y.o. 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 Pt initially seen at [facility]. Pt states that she has had productive cough over the last month. Pt alos 1/2 PPD smoker. Cough seemed to worsen after dialysis on Saturday. Pt went to [facility] was diagnosed with PNA on CXR. Per report, pt completed 2 rounds of zpak and 1 course of amox prior to ER visit. Last admitted 6/17-6/27 for CHF exacerbation that also showed mild aortic stenosis. Noted labs and vitals at [facility] include Temp 99.4, WBC 11.1, ABG w/ pH 7.46, CO2 39, pO2 49 on RA. BUN 40, Cr 8.3 s/p HD. CXR showed cardiac enlargement, mild pulmonary vascular congestion, and perihilar infiltrates concerning for PNA vs. Edema.  Pt given rocephin and azithro at [facility].    Patient Active Problem List   Diagnosis Date Noted  . Dyslipidemia-LDL 104, not on statin with Hx of liver transplant 07/30/2012  . Abn EKG- related to LVH, repol changes 07/30/2012  . Acute combined systolic and diastolic heart failure 07/27/2012  . CAD- moderate with 40% - 50% LAD 07/29/12 07/27/2012  . Aortic stenosis, - mild by cath 07/29/12 (severe by 2D) 07/27/2012  . Chest pain on admission, probably related to acute CHF. 12/22/2011  . S/P liver transplant at [facility] 9/11 12/22/2011  . End stage renal disease on dialysis 02/27/2011  . HEPATITIS C 06/25/2006  . HYPOTHYROIDISM 06/25/2006  . DIABETES MELLITUS, TYPE II 06/25/2006  . ANEMIA, CHRONIC 06/25/2006  . HTN- LVH, EF45- 50%. garde 2 diastolic dysf. 07/28/12  06/25/2006  . GERD 06/25/2006  . CIRRHOSIS 06/25/2006   Past Medical History: Past Medical History  Diagnosis Date  . Liver disease     cirrhosis and this is why she had the transplant in 2011  . Hepatitis C   . Chronic back pain   . Diverticulosis   . CAD (coronary artery disease)   . Obesity   . Peripheral vascular disease hands and legs  . Anxiety   . Asthma   . Headache(784.0)     last migraine 6+yrs ago  . Arthritis     back  . GERD (gastroesophageal reflux disease)     takes Omeprazole daily  . Chronic constipation     takes MIralax and Colace daily  . Anemia     takes Folic Acid daily  . Hypothyroidism     takes Synthroid daily  . Diabetes mellitus     Levemir 2units daily if > 150  . Depression     takes Cymbalta for "severe" depression  . Pneumonia   . Neuromuscular disorder     carpal tunnel in right hand  . Hypertension     takes Metoprolol and Lisinopril daily, sees Dr Shary Decamp  . Pneumonia   . End stage renal disease on dialysis 02/27/2011    Kidneys shut down at time of liver transplant in Sept 2011 at [facility] in [place], she has been on HD ever since.  Dialyzes at [facility] HD on TTS schedule.  Had L forearm graft used 10 months then removed Dec 2012 due to suspected  infection.  A right upper arm AVG was placed Dec 2012 but she developed steal symptoms acutely and it was ligated the same day.  Never had an AV fistula due to small veins.  Now has L thigh AVG put in Jan 2013, has not clotted to date.      Past Surgical History: Past Surgical History  Procedure Laterality Date  . Liver transplant  10/25/2009    sees Dr Barnetta Hammersmith 1 every 6 months, saw last in Dec 2013. Odette Horns Coord [phone]  . Small intestine surgery  90's  . Thrombectomy    . Arteriovenous graft placement  10/03/10    Left forearm AVG  . Avgg removal  12/23/2010    Procedure: REMOVAL OF ARTERIOVENOUS GORETEX GRAFT (AVGG);  Surgeon: Sherren Kerns, MD;  Location: [facility] OR;  Service:  Vascular;  Laterality: Left;  procedure started @1736 -1852  . Insertion of dialysis catheter  12/23/2010    Procedure: INSERTION OF DIALYSIS CATHETER;  Surgeon: Sherren Kerns, MD;  Location: [facility] OR;  Service: Vascular;  Laterality: Right;  Right Internal Jugular 28cm dialysis catheter insertion procedure time 1701-1720   . Cholecystectomy  1993  . Cystoscopy  15+yrs ago  . Back surgery  2010  . Av fistula placement  01/29/2011    Procedure: INSERTION OF ARTERIOVENOUS (AV) GORE-TEX GRAFT ARM;  Surgeon: Sherren Kerns, MD;  Location: [facility] OR;  Service: Vascular;  Laterality: Right;  . Tubal ligation    . Av fistula placement  03/10/2011    Procedure: INSERTION OF ARTERIOVENOUS (AV) GORE-TEX GRAFT THIGH;  Surgeon: Sherren Kerns, MD;  Location: [facility] OR;  Service: Vascular;  Laterality: Left;    Social History: History   Social History  . Marital Status: Divorced    Spouse Name: N/A    Number of Children: N/A  . Years of Education: N/A   Social History Main Topics  . Smoking status: Current Every Day Smoker -- 0.25 packs/day for 30 years    Types: Cigarettes  . Smokeless tobacco: Never Used  . Alcohol Use: No  . Drug Use: No  . Sexually Active: No   Other Topics Concern  . Not on file   Social History Narrative  . No narrative on file    Family History: Family History  Problem Relation Age of Onset  . Cancer Mother   . Diabetes Mother   . Hypertension Mother   . Stroke Mother   . Cancer Father   . Anesthesia problems Neg Hx   . Hypotension Neg Hx   . Malignant hyperthermia Neg Hx   . Pseudochol deficiency Neg Hx     Allergies: Allergies  Allergen Reactions  . Acetaminophen Other (See Comments)    Liver problems  . Codeine Itching    Current Facility-Administered Medications  Medication Dose Route Frequency Provider Last Rate Last Dose  . albuterol (PROVENTIL HFA;VENTOLIN HFA) 108 (90 BASE) MCG/ACT inhaler 2 puff  2 puff Inhalation Q4H PRN Doree Albee, MD       . aspirin EC tablet 81 mg  81 mg Oral Daily Doree Albee, MD      . calcium carbonate (TUMS - dosed in mg elemental calcium) chewable tablet 200 mg of elemental calcium  1 tablet Oral Daily Doree Albee, MD      . ceFEPIme (MAXIPIME) 1 g in dextrose 5 % 50 mL IVPB  1 g Intravenous Q8H Doree Albee, MD      . cinacalcet [facility]) tablet 30 mg  30 mg Oral Q breakfast Doree Albee, MD      . dicyclomine (BENTYL) capsule 10 mg  10 mg Oral Q6H PRN Doree Albee, MD      . docusate sodium (COLACE) capsule 100 mg  100 mg Oral BID Doree Albee, MD      . Melene Muller ON 08/31/2012] doxercalciferol (HECTOROL) injection 8 mcg  8 mcg Intravenous Q T,Th,Sa-HD Doree Albee, MD      . DULoxetine (CYMBALTA) DR capsule 60 mg  60 mg Oral Daily Doree Albee, MD      . Melene Muller ON 09/02/2012] ferric gluconate (NULECIT) 62.5 mg in sodium chloride 0.9 % 100 mL IVPB  62.5 mg Intravenous Q Thu-HD Doree Albee, MD      . gabapentin (NEURONTIN) capsule 300 mg  300 mg Oral Daily Doree Albee, MD      . heparin injection 5,000 Units  5,000 Units Subcutaneous Q8H Doree Albee, MD      . insulin detemir (LEVEMIR) injection 3 Units  3 Units Subcutaneous Daily Doree Albee, MD      . levothyroxine (SYNTHROID, LEVOTHROID) tablet 150 mcg  150 mcg Oral QAC breakfast Doree Albee, MD      . lisinopril (PRINIVIL,ZESTRIL) tablet 5 mg  5 mg Oral Daily Doree Albee, MD      . methylPREDNISolone sodium succinate (SOLU-MEDROL) 125 mg/2 mL injection 125 mg  125 mg Intramuscular Q8H Doree Albee, MD      . metoprolol tartrate (LOPRESSOR) tablet 25 mg  25 mg Oral See admin instructions Doree Albee, MD      . multivitamin (RENA-VIT) tablet 1 tablet  1 tablet Oral Daily Doree Albee, MD      . nicotine (NICODERM CQ - dosed in mg/24 hr) patch 7 mg  7 mg Transdermal Q24H Doree Albee, MD      . nitroGLYCERIN (NITROSTAT) SL tablet 0.4 mg  0.4 mg Sublingual Q5 min PRN Doree Albee, MD      . pantoprazole (PROTONIX) EC tablet 40 mg   40 mg Oral Daily Doree Albee, MD      . polyethylene glycol (MIRALAX / GLYCOLAX) packet 17 g  17 g Oral BID Doree Albee, MD      . promethazine (PHENERGAN) tablet 12.5 mg  12.5 mg Oral Q6H PRN Doree Albee, MD      . senna [facility]) tablet 8.6 mg  1 tablet Oral QPM Doree Albee, MD      . sucralfate (CARAFATE) tablet 1 g  1 g Oral BID Doree Albee, MD      . tacrolimus (PROGRAF) capsule 5 mg  5 mg Oral BID Doree Albee, MD      . traMADol Janean Sark) tablet 50 mg  50 mg Oral Q6H PRN Doree Albee, MD       Review Of Systems: 12 point ROS negative except as noted above in HPI.  Physical Exam: Filed Vitals:   08/30/12 0518  BP: 161/95  Pulse: 89  Temp: 97 F (36.1 C)  Resp: 19    General: alert and dissheveld appearing  HEENT: PERRLA and extra ocular movement intact Heart: S1, S2 normal, no murmur, rub or gallop, regular rate and rhythm Lungs: faint wheezes in apices Abdomen: abdomen is soft without significant tenderness, masses, organomegaly or guarding Extremities: extremities normal, atraumatic, no cyanosis or edema Skin:no rashes, no ecchymoses Neurology: normal without focal findings  Labs and Imaging: Lab Results  Component Value Date/Time   NA 135 07/29/2012  7:58 PM   K 4.5 07/29/2012  7:58 PM   CL 97 07/29/2012  7:58 PM   CO2 29 07/29/2012  7:58 PM   BUN 30* 07/29/2012  7:58 PM   CREATININE 6.59* 07/29/2012  7:58 PM   GLUCOSE 195* 07/29/2012  7:58 PM   Lab Results  Component Value Date   WBC 4.7 07/29/2012   HGB 10.5* 07/29/2012   HCT 31.4* 07/29/2012   MCV 94.0 07/29/2012   PLT 105* 07/29/2012    No results found.   Assessment and Plan: KEVA DARTY is a 57 y.o. year old female presenting with cough and resp failure  Pulm: Resp failure-likely multifactorial with contributions of PNA, COPD and possible CHF. Start HCAP treatment with vanc and cefepime. Blood cultures. IV solumedrol. Check pro-BNP. Follow up with renal and cards to assess ? Need for UF and dry  weight. Continue supplemental O2. No resp distress currently.  Cardiovascular: cycle CEs. Check Pro-BNP. Minimal CP currently. EKG pending. Continue home meds. Consider cards c/s given recent admission.  Renal : on HD Tue, Th, S. Consult renal. Unclear if pt is near her dry weight or not. Not pulmo vasc congestion w/in 24 hours of completed HD. Continue home regimen.  FEN/GI: Renal diet. Continue immunosuppressant meds. Recheck liver function. PPI.  Endocrine: Continue synthroid. SSI. A1C.  Hematology: Noted leukocytosis @ OSH. Recheck and trend. ARD-hgb stable. Recheck and trend.  Prophylaxis: sub q heparin  Disposition: pending further evaluation  Code Status:limited code; CPR only        Doree Albee MD  Pager: [phone]

## 2012-08-31 DIAGNOSIS — N186 End stage renal disease: Secondary | ICD-10-CM | POA: Diagnosis not present

## 2012-08-31 DIAGNOSIS — I5041 Acute combined systolic (congestive) and diastolic (congestive) heart failure: Secondary | ICD-10-CM | POA: Diagnosis not present

## 2012-08-31 DIAGNOSIS — I2510 Atherosclerotic heart disease of native coronary artery without angina pectoris: Secondary | ICD-10-CM | POA: Diagnosis not present

## 2012-08-31 DIAGNOSIS — J189 Pneumonia, unspecified organism: Secondary | ICD-10-CM | POA: Diagnosis not present

## 2012-08-31 DIAGNOSIS — E8779 Other fluid overload: Secondary | ICD-10-CM | POA: Diagnosis not present

## 2012-08-31 DIAGNOSIS — J441 Chronic obstructive pulmonary disease with (acute) exacerbation: Secondary | ICD-10-CM | POA: Diagnosis present

## 2012-08-31 DIAGNOSIS — I359 Nonrheumatic aortic valve disorder, unspecified: Secondary | ICD-10-CM | POA: Diagnosis not present

## 2012-08-31 LAB — GLUCOSE, CAPILLARY
Glucose-Capillary: 228 mg/dL — ABNORMAL HIGH (ref 70–99)
Glucose-Capillary: 266 mg/dL — ABNORMAL HIGH (ref 70–99)
Glucose-Capillary: 278 mg/dL — ABNORMAL HIGH (ref 70–99)

## 2012-08-31 MED ORDER — OXYCODONE HCL 5 MG PO TABS
5.0000 mg | ORAL_TABLET | Freq: Once | ORAL | Status: AC
  Administered 2012-08-31: 5 mg via ORAL
  Filled 2012-08-31: qty 1

## 2012-08-31 MED ORDER — DOXERCALCIFEROL 4 MCG/2ML IV SOLN
6.0000 ug | INTRAVENOUS | Status: DC
  Administered 2012-08-31: 6 ug via INTRAVENOUS

## 2012-08-31 MED ORDER — OXYCODONE HCL 5 MG PO TABS
5.0000 mg | ORAL_TABLET | Freq: Four times a day (QID) | ORAL | Status: DC | PRN
  Administered 2012-08-31 – 2012-09-01 (×2): 5 mg via ORAL
  Filled 2012-08-31: qty 1

## 2012-08-31 MED ORDER — DM-GUAIFENESIN ER 30-600 MG PO TB12
1.0000 | ORAL_TABLET | Freq: Two times a day (BID) | ORAL | Status: DC
  Administered 2012-08-31 – 2012-09-01 (×3): 1 via ORAL
  Filled 2012-08-31 (×4): qty 1

## 2012-08-31 NOTE — Progress Notes (Signed)
[facility] Progress Note  Subjective:   Up to the sink for ADLs. Still tired with cough but starting to feel better  Objective Filed Vitals:   08/30/12 2147 08/30/12 2155 08/31/12 0223 08/31/12 0634  BP:  141/82  171/97  Pulse: 79 77 77 76  Temp:  97.9 F (36.6 C)  98 F (36.7 C)  TempSrc:  Oral  Oral  Resp: 14 18 16 18   Height:      Weight:  71.1 kg (156 lb 12 oz)    SpO2: 98% 100% 98% 93%   Physical Exam General: Alert, cooperative, NAD Heart: RRR, + murmur Gr 3/6 holosys M Lungs: Diminished at bases. Prod cough. No wheezes, rales or rhonchi  Abdomen: Soft, NT, normal BS liver down 4 cm Extremities: No LE edema Dialysis Access: L thigh AVG + bruit  Dialysis Orders: Center: [facility] on TTS .  Optiflux 160 EDW 71.5 HD Bath 2K/2.25Ca Time 4:00 Heparin NONE. Access L thigh AVG BFR 400 DFR A1.5  Hectorol 6 mcg IV/HD Epogen 2000 Units IV/HD Venofer 50 q week  Recent labs: Hgb 10.9, Tsat 44%, Ferritin 781 in April, Phos 9.0, PTH 148.3  Assessment/Plan: 1. Respiratory failure - Per admit. Likely multifactorial with PNA, COPD and possible CHF contributing. Afebrile. Cards consulted. ^^BNP on admission, repeat pending. Moderate aortic stenosis with EF 45-50% by echo 6/201. On Vanc/cefepime for HCAP.  2. ESRD - TTS as op. K+ 4.6. HD today to get back on schedule.   3. Hypertension/volume - SBPs 140s-170s on home meds metoprolol and lisinopril. Makes EDW as op. Pulm vasc congestion, pulm edema and bilateral effusions on CXR.? Accuracy of wgts here. Was under EDW via standing wgt in HD yesterday (67.7 kg)  Hypotension limiting UF yesterday - Net 3.4L.  Abbrev HD again today for mod volume excess and to resume normal sched. Will need lower EDW at d/c. Stopped lisinopril 4. Anemia - Hgb 10.7  Aranesp 40 here. Last op Tsat 44% on 6/26. Ferritin 1095. Hold weekly Fe for now. 5. Metabolic bone disease - Ca controlled op. Phos elevated. Rx'd Tums and Velphoro as op. Pt endorses  non-compliance but agrees to take the Tums while here. PTH within range as op. Continue hectorol, sensipar.  6. Nutrition - Renal diet, multivitamin 7. COPD - Per admit. Recent admit for exacerbation 6/17-6/27. Still smoking. Suspect one of primary issues 8. Hypothyroidism - on synthroid per admit 9. DM Type II - on insulin per admit good control 10. Depression - on Cymbalta per admit. Worsening sx reported but mood better today now that she is feeling better. Will coordinate f/u with op psych. No SI/HI.  11.  liver transplant - on immunosuppressants per admit.   Scot Jun. Broadus John, PA-C [facility] Pager [phone] 08/31/2012,8:59 AM  LOS: 1 day  I have seen and examined this patient and agree with the plan of care seen, examined,eval, counseled.  Get on HD sched today .  Weltha Cathy L 08/31/2012, 9:45 AM    Additional Objective Labs: Basic Metabolic Panel:  Recent Labs Lab 08/30/12 0757 08/30/12 1449  NA  --  134*  K  --  4.6  CL  --  91*  CO2  --  24  GLUCOSE  --  196*  BUN  --  49*  CREATININE 8.61* 9.05*  CALCIUM  --  9.6  PHOS  --  8.7*   Liver Function Tests:  Recent Labs Lab 08/30/12 1449  ALBUMIN 2.8*   No results  found for this basename: LIPASE, AMYLASE,  in the last 168 hours CBC:  Recent Labs Lab 08/30/12 0757 08/30/12 1449  WBC 6.7 7.3  HGB 10.2* 10.7*  HCT 30.8* 31.5*  MCV 97.8 96.3  PLT 138* 151   Blood Culture    Component Value Date/Time   SDES BLOOD LEFT HAND 08/30/2012 0800   SPECREQUEST BOTTLES DRAWN AEROBIC ONLY 4CC 08/30/2012 0800   CULT        BLOOD CULTURE RECEIVED NO GROWTH TO DATE CULTURE WILL BE HELD FOR 5 DAYS BEFORE ISSUING A FINAL NEGATIVE REPORT 08/30/2012 0800   REPTSTATUS PENDING 08/30/2012 0800    Cardiac Enzymes:  Recent Labs Lab 08/30/12 0758 08/30/12 1400 08/30/12 2033  TROPONINI <0.30 <0.30 <0.30   CBG:  Recent Labs Lab 08/30/12 0724 08/30/12 1126 08/30/12 2154 08/31/12 0754  GLUCAP 257*  261* 228* 266*   Iron Studies:  Recent Labs  08/30/12 1449  FERRITIN 1095*   @[email or web address] @ Studies/Results: Dg Chest 2 View  08/30/2012   *RADIOLOGY REPORT*  Clinical Data: Shortness of breath.  Pneumonia.  Pulmonary edema.  CHEST - 2 VIEW  Comparison: 08/30/2012 and 07/28/2012 and 07/20/2012  Findings: There are small bilateral pleural effusions.  There is increased pulmonary vascular congestion with bilateral interstitial pulmonary edema.  Heart size is within normal limits.  No osseous abnormality.  IMPRESSION: Increased pulmonary vascular congestion with recurrent interstitial pulmonary edema and new small bilateral effusions.   Original Report Authenticated By: Francene Boyers, M.D.   Medications:   . albuterol  2.5 mg Nebulization Q6H WA  . aspirin EC  81 mg Oral Daily  . calcium carbonate  3 tablet Oral QAC breakfast  . ceFEPime (MAXIPIME) IV  2 g Intravenous Q T,Th,Sat-1800  . ceFEPime (MAXIPIME) IV  2 g Intravenous Once  . cinacalcet  30 mg Oral Q breakfast  . dextromethorphan-guaiFENesin  1 tablet Oral BID  . docusate sodium  100 mg Oral BID  . DULoxetine  60 mg Oral Daily  . gabapentin  300 mg Oral QHS  . heparin  5,000 Units Subcutaneous Q8H  . insulin aspart  0-15 Units Subcutaneous TID WC  . insulin aspart  0-5 Units Subcutaneous QHS  . insulin detemir  3 Units Subcutaneous Daily  . ipratropium  0.5 mg Nebulization Q6H WA  . levothyroxine  150 mcg Oral QAC breakfast  . methylPREDNISolone (SOLU-MEDROL) injection  60 mg Intravenous Q8H  . metoprolol tartrate  12.5 mg Oral Custom  . metoprolol tartrate  25 mg Oral Custom  . multivitamin  1 tablet Oral QHS  . nicotine  14 mg Transdermal Daily  . oxyCODONE  5 mg Oral Once  . pantoprazole  40 mg Oral Daily  . polyethylene glycol  17 g Oral BID  . senna  1 tablet Oral QPM  . tacrolimus  5 mg Oral BID  . vancomycin  750 mg Intravenous Q T,Th,Sa-HD

## 2012-08-31 NOTE — Progress Notes (Signed)
Subjective:  Better, less SOB, no chest pain  Objective:  Vital Signs in the last 24 hours: Temp:  [97.7 F (36.5 C)-98.2 F (36.8 C)] 98 F (36.7 C) (07/22 0634) Pulse Rate:  [66-79] 76 (07/22 0634) Resp:  [13-18] 18 (07/22 0634) BP: (90-171)/(59-97) 171/97 mmHg (07/22 0634) SpO2:  [90 %-100 %] 93 % (07/22 0634) Weight:  [67.7 kg (149 lb 4 oz)-71.1 kg (156 lb 12 oz)] 71.1 kg (156 lb 12 oz) (07/21 2155)  Intake/Output from previous day:  Intake/Output Summary (Last 24 hours) at 08/31/12 0815 Last data filed at 08/31/12 1610  Gross per 24 hour  Intake      0 ml  Output   3427 ml  Net  -3427 ml    Physical Exam: General appearance: alert, cooperative and no distress Lungs: decreased breath sounds Rt base Heart: regular rate and rhythm and 2/6 systolic murmur of AS Extremities: no LE edema   Rate: 76  Rhythm: normal sinus rhythm  Lab Results:  Recent Labs  08/30/12 0757 08/30/12 1449  WBC 6.7 7.3  HGB 10.2* 10.7*  PLT 138* 151    Recent Labs  08/30/12 0757 08/30/12 1449  NA  --  134*  K  --  4.6  CL  --  91*  CO2  --  24  GLUCOSE  --  196*  BUN  --  49*  CREATININE 8.61* 9.05*    Recent Labs  08/30/12 1400 08/30/12 2033  TROPONINI <0.30 <0.30   Hepatic Function Panel  Recent Labs  08/30/12 1449  ALBUMIN 2.8*   No results found for this basename: CHOL,  in the last 72 hours No results found for this basename: INR,  in the last 72 hours  Imaging: Dg Chest 2 View  08/30/2012   *RADIOLOGY REPORT*  Clinical Data: Shortness of breath.  Pneumonia.  Pulmonary edema.  CHEST - 2 VIEW  Comparison: 08/30/2012 and 07/28/2012 and 07/20/2012  Findings: There are small bilateral pleural effusions.  There is increased pulmonary vascular congestion with bilateral interstitial pulmonary edema.  Heart size is within normal limits.  No osseous abnormality.  IMPRESSION: Increased pulmonary vascular congestion with recurrent interstitial pulmonary edema and new small  bilateral effusions.   Original Report Authenticated By: Francene Boyers, M.D.    Cardiac Studies:  Assessment/Plan:   Principal Problem:   Acute combined systolic and diastolic heart failure Active Problems:   Aortic stenosis, "moderate" per Dr C by 2D ehco 07/2012.    DIABETES MELLITUS, TYPE II   HTN- LVH, EF45- 50%. garde 2 diastolic dysf. 07/28/12   End stage renal disease on dialysis   CAD- moderate with 40% - 50% LAD 07/29/12   Dyslipidemia-LDL 104, not on statin with Hx of liver transplant   HEPATITIS C- s/p liver transplant 2011   HYPOTHYROIDISM    PLAN: MD to see. She is ABs for suspected bronchitis/pneumonia. CHF Rx'd with HD. She has moderate AS and moderate CAD. Last admission her chest pain was directly related to CHF. BNP again 41k on admission, will recheck now that she is stable. This may not be a reliable marker for her.   Corine Shelter PA-C [facility] [phone] 08/31/2012, 8:15 AM   .Much improved after volume removal with HD. Although her AS is not a trivial problem, she would be a challenging candidate for both TAVR and conventional AVR. I would proceed with medical therapy for time being.  Thurmon Fair, MD, [facility] [phone] office [phone] pager

## 2012-08-31 NOTE — Progress Notes (Signed)
Inpatient Diabetes Program Recommendations  AACE/ADA: New Consensus Statement on Inpatient Glycemic Control (2013)  Target Ranges:  Prepandial:   less than 140 mg/dL      Peak postprandial:   less than 180 mg/dL (1-2 hours)      Critically ill patients:  140 - 180 mg/dL     Results for [name], [name] (MRN [id number]) as of 08/31/2012 14:09  Ref. Range 08/31/2012 07:54 08/31/2012 11:44  Glucose-Capillary Latest Range: 70-99 mg/dL 295 (H) 284 (H)    Patient getting IV steroids.  CBGs above goal.    Recommend the following: 1. Increase Levemir to 8 units daily 2. May need scheduled Novolog meal coverage as well   Will follow. Ambrose Finland RN, MSN, CDE Diabetes Coordinator Inpatient Diabetes Program [phone]

## 2012-08-31 NOTE — Progress Notes (Addendum)
[facility] PROGRESS NOTE  [name] ZOX:[id number] DOB: 10-01-1955 DOA: 08/30/2012 PCP: [name], MD   Brief narrative 57 y.o. year old female with ESRD on HD ( T,T,S)s/p renal transplant, mixed CHF, CAD, liver failure from hep C s/p liver transplant and recent hospitalization for CHF presenting with progressive SOB and cough. CXR suggestive of pulm vascular congestion with some pulmonary edema likely in the setting of HCAP with CHF exacerbation.   Assessment/Plan:  Acute hypoxic resp failure  Likely combination of CHF, pneumonia and COPD exacerbation. Had pulmonary vascular congestion, pulm edema and bilateral effusion on chest x ray. Elevated pro BNP  Treating empirically with vanco and cefepime for HCAP. Also likely has some COPD exacerbation. Follow cx., strep ag and legionalla ag  appreciate renal eval.  Symptoms have much improved after HD yesterday. gettign HD today as well. Started on IV solumedrol from COPD exacerbation. Continue scheduled nebs.   ESRD  on HD ( Tu, th, sat). given volume overload received HD yesterday . continue hectoral and sensipar    Hx of liver transplant.  continue prograf   CHF  as outlined above  recently admitted for CHF exacerbation. EF of 45% with grade 2 diastolic dysfunction. [facility] following continue metoprolol and ASA   DM  On lantus and SSI. Monitor fsg   Hypothyroidism  continue synthroid   Tobacco use  counseled strongly on cessation. Nicotine patch ordered  Code Status: FULL  Family Communication:none at bedside  Disposition Plan: home once stable . Likely in next 1-2 days if symptoms improved  Consultants:  Renal  [facility]  Procedures:  none Antibiotics:  IV vanco and cefepime ( 7/21>>)  HPI/Subjective:  C/o cough and SOB have improved .   Objective: Filed Vitals:   08/30/12 2155 08/31/12 0223 08/31/12 0634 08/31/12 0900  BP: 141/82  171/97 156/78  Pulse: 77 77 76 76  Temp: 97.9 F (36.6 C)  98 F (36.7  C)   TempSrc: Oral  Oral   Resp: 18 16 18 18   Height:      Weight: 71.1 kg (156 lb 12 oz)     SpO2: 100% 98% 93% 100%    Intake/Output Summary (Last 24 hours) at 08/31/12 1057 Last data filed at 08/31/12 0924  Gross per 24 hour  Intake    240 ml  Output   3427 ml  Net  -3187 ml   Filed Weights   08/30/12 0518 08/30/12 1331 08/30/12 2155  Weight: 73.5 kg (162 lb 0.6 oz) 67.7 kg (149 lb 4 oz) 71.1 kg (156 lb 12 oz)    Exam:  General: Middle aged female in NAD, appears fatigued  HEENT: no pallor, no JVD  CHEST: bibasilar crackles and rhonchi improved. CVS: NS1&S2, no murmurs  Abd: soft, NT, ND, BS+  Musculoskeletal: warm, no edema, left groin AV fistula  CNS: AAOX3    Data Reviewed: Basic Metabolic Panel:  Recent Labs Lab 08/30/12 0757 08/30/12 1449  NA  --  134*  K  --  4.6  CL  --  91*  CO2  --  24  GLUCOSE  --  196*  BUN  --  49*  CREATININE 8.61* 9.05*  CALCIUM  --  9.6  PHOS  --  8.7*   Liver Function Tests:  Recent Labs Lab 08/30/12 1449  ALBUMIN 2.8*   No results found for this basename: LIPASE, AMYLASE,  in the last 168 hours No results found for this basename: AMMONIA,  in the last 168 hours  CBC:  Recent Labs Lab 08/30/12 0757 08/30/12 1449  WBC 6.7 7.3  HGB 10.2* 10.7*  HCT 30.8* 31.5*  MCV 97.8 96.3  PLT 138* 151   Cardiac Enzymes:  Recent Labs Lab 08/30/12 0758 08/30/12 1400 08/30/12 2033  TROPONINI <0.30 <0.30 <0.30   BNP (last 3 results)  Recent Labs  07/20/12 0154 08/30/12 0758  PROBNP 42149.0* 41152.0*   CBG:  Recent Labs Lab 08/30/12 0724 08/30/12 1126 08/30/12 2154 08/31/12 0754  GLUCAP 257* 261* 228* 266*    Recent Results (from the past 240 hour(s))  CULTURE, BLOOD (ROUTINE X 2)     Status: None   Collection Time    08/30/12  7:50 AM      Result Value Range Status   Specimen Description BLOOD RIGHT ARM   Final   Special Requests BOTTLES DRAWN AEROBIC ONLY 4CC   Final   Culture  Setup Time  08/30/2012 13:50   Final   Culture     Final   Value:        BLOOD CULTURE RECEIVED NO GROWTH TO DATE CULTURE WILL BE HELD FOR 5 DAYS BEFORE ISSUING A FINAL NEGATIVE REPORT   Report Status PENDING   Incomplete  CULTURE, BLOOD (ROUTINE X 2)     Status: None   Collection Time    08/30/12  8:00 AM      Result Value Range Status   Specimen Description BLOOD LEFT HAND   Final   Special Requests BOTTLES DRAWN AEROBIC ONLY 4CC   Final   Culture  Setup Time 08/30/2012 13:50   Final   Culture     Final   Value:        BLOOD CULTURE RECEIVED NO GROWTH TO DATE CULTURE WILL BE HELD FOR 5 DAYS BEFORE ISSUING A FINAL NEGATIVE REPORT   Report Status PENDING   Incomplete     Studies: Dg Chest 2 View  08/30/2012   *RADIOLOGY REPORT*  Clinical Data: Shortness of breath.  Pneumonia.  Pulmonary edema.  CHEST - 2 VIEW  Comparison: 08/30/2012 and 07/28/2012 and 07/20/2012  Findings: There are small bilateral pleural effusions.  There is increased pulmonary vascular congestion with bilateral interstitial pulmonary edema.  Heart size is within normal limits.  No osseous abnormality.  IMPRESSION: Increased pulmonary vascular congestion with recurrent interstitial pulmonary edema and new small bilateral effusions.   Original Report Authenticated By: Francene Boyers, M.D.    Scheduled Meds: . albuterol  2.5 mg Nebulization Q6H WA  . aspirin EC  81 mg Oral Daily  . calcium carbonate  3 tablet Oral QAC breakfast  . ceFEPime (MAXIPIME) IV  2 g Intravenous Q T,Th,Sat-1800  . cinacalcet  30 mg Oral Q breakfast  . dextromethorphan-guaiFENesin  1 tablet Oral BID  . docusate sodium  100 mg Oral BID  . [START ON 09/02/2012] doxercalciferol  6 mcg Intravenous Q T,Th,Sa-HD  . DULoxetine  60 mg Oral Daily  . gabapentin  300 mg Oral QHS  . heparin  5,000 Units Subcutaneous Q8H  . insulin aspart  0-15 Units Subcutaneous TID WC  . insulin aspart  0-5 Units Subcutaneous QHS  . insulin detemir  3 Units Subcutaneous Daily  .  ipratropium  0.5 mg Nebulization Q6H WA  . levothyroxine  150 mcg Oral QAC breakfast  . methylPREDNISolone (SOLU-MEDROL) injection  60 mg Intravenous Q8H  . metoprolol tartrate  12.5 mg Oral Custom  . metoprolol tartrate  25 mg Oral Custom  . multivitamin  1 tablet  Oral QHS  . nicotine  14 mg Transdermal Daily  . pantoprazole  40 mg Oral Daily  . polyethylene glycol  17 g Oral BID  . senna  1 tablet Oral QPM  . tacrolimus  5 mg Oral BID  . vancomycin  750 mg Intravenous Q T,Th,Sa-HD   Continuous Infusions:     Time spent: 25 minutes    Aizah Gehlhausen  [facility] Pager [phone] If 7PM-7AM, please contact night-coverage at [email or web address], password [facility] 08/31/2012, 10:57 AM  LOS: 1 day

## 2012-09-01 DIAGNOSIS — D539 Nutritional anemia, unspecified: Secondary | ICD-10-CM

## 2012-09-01 DIAGNOSIS — E119 Type 2 diabetes mellitus without complications: Secondary | ICD-10-CM

## 2012-09-01 DIAGNOSIS — I5041 Acute combined systolic (congestive) and diastolic (congestive) heart failure: Secondary | ICD-10-CM | POA: Diagnosis not present

## 2012-09-01 DIAGNOSIS — N186 End stage renal disease: Secondary | ICD-10-CM | POA: Diagnosis not present

## 2012-09-01 DIAGNOSIS — I359 Nonrheumatic aortic valve disorder, unspecified: Secondary | ICD-10-CM | POA: Diagnosis not present

## 2012-09-01 DIAGNOSIS — J441 Chronic obstructive pulmonary disease with (acute) exacerbation: Secondary | ICD-10-CM | POA: Diagnosis not present

## 2012-09-01 DIAGNOSIS — E8779 Other fluid overload: Secondary | ICD-10-CM | POA: Diagnosis not present

## 2012-09-01 DIAGNOSIS — I2510 Atherosclerotic heart disease of native coronary artery without angina pectoris: Secondary | ICD-10-CM

## 2012-09-01 DIAGNOSIS — E039 Hypothyroidism, unspecified: Secondary | ICD-10-CM

## 2012-09-01 DIAGNOSIS — I10 Essential (primary) hypertension: Secondary | ICD-10-CM

## 2012-09-01 LAB — GLUCOSE, CAPILLARY: Glucose-Capillary: 315 mg/dL — ABNORMAL HIGH (ref 70–99)

## 2012-09-01 LAB — PRO B NATRIURETIC PEPTIDE: Pro B Natriuretic peptide (BNP): 32077 pg/mL — ABNORMAL HIGH (ref 0–125)

## 2012-09-01 MED ORDER — ALBUTEROL SULFATE (5 MG/ML) 0.5% IN NEBU: 2.5000 mg | INHALATION_SOLUTION | Freq: Four times a day (QID) | RESPIRATORY_TRACT | Status: DC | PRN

## 2012-09-01 MED ORDER — DM-GUAIFENESIN ER 30-600 MG PO TB12: 1.0000 | ORAL_TABLET | Freq: Two times a day (BID) | ORAL | Status: DC

## 2012-09-01 MED ORDER — DOXERCALCIFEROL 4 MCG/2ML IV SOLN: 4.0000 ug | INTRAVENOUS | Status: DC

## 2012-09-01 MED ORDER — CEFDINIR 300 MG PO CAPS: 300.0000 mg | ORAL_CAPSULE | Freq: Two times a day (BID) | ORAL | Status: DC

## 2012-09-01 NOTE — Progress Notes (Signed)
Physical Therapy Treatment Patient Details Name: [name] MRN: [id number] DOB: 02-Jan-1956 Today's Date: 09/01/2012 Time: 1308-6578 PT Time Calculation (min): 9 min  PT Assessment / Plan / Recommendation  History of Present Illness 57 y.o. 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 CXR suggestive of pulm vascular congestion with some pulmonary  edema likely in the setting of HCAP with CHF exacerbations.      PT Comments   Pt reports d/c home today however agreeable to practice stairs prior to d/c.  Pt doing well with mobility and plans to f/u with HHPT upon d/c.   Follow Up Recommendations  Home health PT;Supervision for mobility/OOB     Does the patient have the potential to tolerate intense rehabilitation     Barriers to Discharge        Equipment Recommendations  None recommended by PT    Recommendations for Other Services    Frequency     Progress towards PT Goals Progress towards PT goals: Progressing toward goals  Plan Current plan remains appropriate    Precautions / Restrictions Precautions Precautions: Fall   Pertinent Vitals/Pain n/a    Mobility  Bed Mobility Bed Mobility: Supine to Sit;Sit to Supine Supine to Sit: 6: Modified independent (Device/Increase time) Sit to Supine: 6: Modified independent (Device/Increase time) Transfers Transfers: Sit to Stand;Stand to Sit Sit to Stand: With upper extremity assist;From bed;5: Supervision Stand to Sit: With upper extremity assist;To bed;5: Supervision Details for Transfer Assistance: min/guard for safety, unsteady with rise however self corrected Ambulation/Gait Ambulation/Gait Assistance: 5: Supervision Ambulation Distance (Feet): 320 Feet Assistive device: Rolling walker Ambulation/Gait Assistance Details: pt demonstrated improved steadiness today  with RW Gait Pattern: Step-through pattern;Decreased stride length Gait velocity: decreased Stairs: Yes Stairs Assistance: 4: Min guard Stairs Assistance Details (indicate cue type and reason): step to pattern at first however pt progressed to step through Stair Management Technique: One rail Right;Step to pattern;Alternating pattern;Forwards Number of Stairs: 10    Exercises     PT Diagnosis:    PT Problem List:   PT Treatment Interventions:     PT Goals (current goals can now be found in the care plan section)    Visit Information  Last PT Received On: 09/01/12 Assistance Needed: +1 History of Present Illness: 57 y.o. 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 CXR suggestive of pulm vascular congestion with some pulmonary  edema likely in the setting of HCAP with CHF exacerbations.    Subjective Data      Cognition  Cognition Arousal/Alertness: Awake/alert Behavior During Therapy: WFL for tasks assessed/performed Overall Cognitive Status: Within Functional Limits for tasks assessed    Balance     End of Session PT - End of Session Activity Tolerance: Patient tolerated treatment well Patient left: in bed;with call bell/phone within reach   GP     [name],KATHrine E 09/01/2012, 4:10 PM Zenovia Jarred, PT, DPT 09/01/2012 Pager: [phone]

## 2012-09-01 NOTE — Progress Notes (Signed)
Pt. Seen and examined. Agree with the NP/PA-C note as written.  Volume overload is improving with dialysis. BNP also is declining, suggesting that we may be able to use this to track her volume status. There is concomitant COPD exacerbation which seems to be responding to antibiotics and breathing treatments. Moderate AS - with sampling volume error on echo (in retrospect, I agree the physical exam supports moderate AS as well). Continue volume removal.  Chrystie Nose, MD, [facility] Attending Cardiologist [facility]

## 2012-09-01 NOTE — Progress Notes (Signed)
[facility] Progress Note  Subjective:   Feels much better.  Nebs working well and requests rx to have them at home.   Objective Filed Vitals:   08/31/12 1804 08/31/12 2046 08/31/12 2213 09/01/12 0520  BP: 117/78  154/69 148/82  Pulse: 78  82 79  Temp: 98.4 F (36.9 C)  98.4 F (36.9 C) 98 F (36.7 C)  TempSrc:   Oral Oral  Resp: 18  18 20   Height:   5\' 4"  (1.626 m)   Weight:   65.2 kg (143 lb 11.8 oz)   SpO2: 97% 99% 100% 99%   Physical Exam General: Alert, cooperative, NAD Heart: RRR, s1 s2 Lungs: Faint crackles at bases, no rales or rhonchi  Abdomen: Soft, NT, +BS Extremities: No LE edema Dialysis Access: L thigh AVG + bruit  Dialysis Orders: Center: [facility] on TTS .  F160     EDW 71.5         Bath 2K/2.25Ca   4 hrs    Heparin NONE    L thigh AVG Hectorol 6 mcg      Epogen 2000 Units       Venofer 50 q week  Recent labs: Hgb 10.9, Tsat 44%, Ferritin 781 in April, Phos 9.0, PTH 148.3  Assessment/Plan: 1. Respiratory failure - Per admit. Likely multifactorial with PNA, COPD and possible CHF contributing. Afebrile. Cards consulted. BNP remains elevated but improving. Moderate aortic stenosis with EF 45-50% by echo 6/201. On Vanc/cefepime for HCAP. 2. ESRD - TTS. Now back on schedule. K+ 4.6.  Next HD 7/24 3. Hypertension/volume - SBPs 110s-150s on home  metoprolol.  Makes EDW as op. Pulm vasc congestion, pulm edema and bilateral effusions on CXR. Lungs much better on PE. Now under EDW with serial HD via standing wgts. Hypotension limiting UF Monday, therefore lisinopril d/c'd - Net UF 3L on Tues. Will need lower EDW at d/c.  4. Anemia - Hgb 10.7 Aranesp 40 here. Last op Tsat 44% on 6/26. Ferritin 1095. Hold weekly Fe for now. 5. Metabolic bone disease - Ca 10.5 corrected. Phos elevated. Rx'd Tums and Velphoro as op. Pt endorses non-compliance but agrees to take the Tums while here because she likes the taste.  PTH within range as op. Will reduce  hectorol to 4 for  now and but may need a non calcium containing binder soon. Continue sensipar. 6. Nutrition - Renal diet, multivitamin 7. COPD - Per admit. Recent admit for exacerbation 6/17-6/27. Still smoking, likely contributory. 8. Hypothyroidism - on synthroid per admit 9. DM Type II - on insulin per admit  10. Depression - on Cymbalta per admit. Worsening sx reported but mood better now that she is feeling better. Will coordinate f/u with op psych. No SI/HI.  11. Hx liver transplant - on immunosuppressants per admit.    Scot Jun. Thad Ranger [facility] Pager [phone] 09/01/2012,8:39 AM  LOS: 2 days   Patient seen and examined.  Agree with assessment and plan as above.  Volume overload resolved, OK for d/c from renal standpoint.  Vinson Moselle  MD Pager [phone]    Cell  [phone] 09/01/2012, 4:07 PM    Additional Objective Labs: Basic Metabolic Panel:  Recent Labs Lab 08/30/12 0757 08/30/12 1449  NA  --  134*  K  --  4.6  CL  --  91*  CO2  --  24  GLUCOSE  --  196*  BUN  --  49*  CREATININE 8.61* 9.05*  CALCIUM  --  9.6  PHOS  --  8.7*   Liver Function Tests:  Recent Labs Lab 08/30/12 1449  ALBUMIN 2.8*   No results found for this basename: LIPASE, AMYLASE,  in the last 168 hours CBC:  Recent Labs Lab 08/30/12 0757 08/30/12 1449  WBC 6.7 7.3  HGB 10.2* 10.7*  HCT 30.8* 31.5*  MCV 97.8 96.3  PLT 138* 151   Blood Culture    Component Value Date/Time   SDES BLOOD LEFT HAND 08/30/2012 0800   SPECREQUEST BOTTLES DRAWN AEROBIC ONLY 4CC 08/30/2012 0800   CULT        BLOOD CULTURE RECEIVED NO GROWTH TO DATE CULTURE WILL BE HELD FOR 5 DAYS BEFORE ISSUING A FINAL NEGATIVE REPORT 08/30/2012 0800   REPTSTATUS PENDING 08/30/2012 0800    Cardiac Enzymes:  Recent Labs Lab 08/30/12 0758 08/30/12 1400 08/30/12 2033  TROPONINI <0.30 <0.30 <0.30   CBG:  Recent Labs Lab 08/31/12 0754 08/31/12 1144 08/31/12 1803 08/31/12 2207 09/01/12 0805  GLUCAP  266* 278* 135* 289* 235*   Iron Studies:  Recent Labs  08/30/12 1449  FERRITIN 1095*   @[email or web address] @ Studies/Results: No results found. Medications:   . albuterol  2.5 mg Nebulization Q6H WA  . aspirin EC  81 mg Oral Daily  . calcium carbonate  3 tablet Oral QAC breakfast  . ceFEPime (MAXIPIME) IV  2 g Intravenous Q T,Th,Sat-1800  . cinacalcet  30 mg Oral Q breakfast  . dextromethorphan-guaiFENesin  1 tablet Oral BID  . docusate sodium  100 mg Oral BID  . [START ON 09/02/2012] doxercalciferol  6 mcg Intravenous Q T,Th,Sa-HD  . DULoxetine  60 mg Oral Daily  . gabapentin  300 mg Oral QHS  . heparin  5,000 Units Subcutaneous Q8H  . insulin aspart  0-15 Units Subcutaneous TID WC  . insulin aspart  0-5 Units Subcutaneous QHS  . insulin detemir  3 Units Subcutaneous Daily  . ipratropium  0.5 mg Nebulization Q6H WA  . levothyroxine  150 mcg Oral QAC breakfast  . methylPREDNISolone (SOLU-MEDROL) injection  60 mg Intravenous Q8H  . metoprolol tartrate  12.5 mg Oral Custom  . metoprolol tartrate  25 mg Oral Custom  . multivitamin  1 tablet Oral QHS  . nicotine  14 mg Transdermal Daily  . pantoprazole  40 mg Oral Daily  . polyethylene glycol  17 g Oral BID  . senna  1 tablet Oral QPM  . tacrolimus  5 mg Oral BID  . vancomycin  750 mg Intravenous Q T,Th,Sa-HD

## 2012-09-01 NOTE — Discharge Summary (Signed)
Physician Discharge Summary  [name]:[id number] DOB: May 08, 1955 DOA: 08/30/2012  PCP: [name] Rossetti, MD  Admit date: 08/30/2012 Discharge date: 09/01/2012  Time spent: > 35 minutes  Recommendations for Outpatient Follow-up:  F/u with breathing status. Patient recently fluid overloaded and improved after dialysis - Patient will need to continue her routine HD sessions - Please evaluate patient and decide whether or not patient will require a more prolonged antibiotic regimen.  Discharge Diagnoses:  Principal Problem:   Acute combined systolic and diastolic heart failure Active Problems:   HEPATITIS C- s/p liver transplant 2011   HYPOTHYROIDISM   DIABETES MELLITUS, TYPE II   HTN- LVH, EF45- 50%. garde 2 diastolic dysf. 07/28/12   End stage renal disease on dialysis   CAD- moderate with 40% - 50% LAD 07/29/12   Aortic stenosis, "moderate" per Dr C by 2D ehco 07/2012.    Dyslipidemia-LDL 104, not on statin with Hx of liver transplant   HCAP (healthcare-associated pneumonia)   COPD exacerbation   Discharge Condition: stable  Diet recommendation: Renal diet/heart healthy diet  Filed Weights   08/31/12 1330 08/31/12 1749 08/31/12 2213  Weight: 68.1 kg (150 lb 2.1 oz) 65 kg (143 lb 4.8 oz) 65.2 kg (143 lb 11.8 oz)    History of present illness:  From original HPI: History of Present Illness:This is a 57 y.o. 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 Pt initially seen at [facility]. Pt states that she has had productive cough over the last month. Pt alos 1/2 PPD smoker. Cough seemed to worsen after dialysis on Saturday. Pt went to [facility] was diagnosed with PNA on CXR. Per report, pt completed 2 rounds of zpak and 1 course of amox prior to ER visit. Last admitted 6/17-6/27 for CHF exacerbation that also showed  mild aortic stenosis. Noted labs and vitals at [facility] include Temp 99.4, WBC 11.1, ABG w/ pH 7.46, CO2 39, pO2 49 on RA. BUN 40, Cr 8.3 s/p HD. CXR showed cardiac enlargement, mild pulmonary vascular congestion, and perihilar infiltrates concerning for PNA vs. Edema. Pt given rocephin and azithro at [facility] Course:   Acute hypoxic resp failure  - Most likely due to fluid overload.  Initially patient being treated for COPD exacerbation as well and antibiotics were administered.  Will continue patient on 3rd generation cephalosporin although my index of suspicion is that this problem was related to patient being fluid overloaded.  -Sputum culture not collected nor urine antigen for legionella or strep.  Patient was afebrile and WBC within normal limits.  Suspect symptoms likely due to patient being fluid overloaded as her breathing condition improved with dialysis.  Patient to continue scheduled dialysis sessions.  No wheezes on physical exam. Therefore will not continue prednisone on discharge.  ESRD  - Continue hectoral and sensipar  - Nephrology managed while patient was in house.  Breathing condition improved after dialysis session. - Patient to continue routine dialysis sessions as outpatient.  Hx of liver transplant.  continue prograf   CHF   - continue metoprolol and ASA   DM  - Patient to continue home regimen  Hypothyroidism  - Continue synthroid on discharge. Stable currently  Tobacco use  counseled strongly on cessation.  Procedures:  HD  Consultations:  Nephrology  Cardiology: Dr. Rennis Golden  Discharge Exam: Ceasar Mons Vitals:  08/31/12 2046 08/31/12 2213 09/01/12 0520 09/01/12 1000  BP:  154/69 148/82 145/71  Pulse:  82 79 80  Temp:  98.4 F (36.9 C) 98 F (36.7 C) 98.6 F (37 C)  TempSrc:  Oral Oral Oral  Resp:  18 20 20   Height:  5\' 4"  (1.626 m)    Weight:  65.2 kg (143 lb 11.8 oz)    SpO2: 99% 100% 99% 98%    General: Pt in  NAD, Alert and Awake Cardiovascular: RRR, no MRG Respiratory: CTA BL, no wheezes  Discharge Instructions  Discharge Orders   Future Orders Complete By Expires     Call MD for:  difficulty breathing, headache or visual disturbances  As directed     Call MD for:  severe uncontrolled pain  As directed     Call MD for:  temperature >100.4  As directed     Diet - low sodium heart healthy  As directed     Discharge instructions  As directed     Comments:      Please be sure to continue your regularly scheduled HD sessions.   Follow up with cardiologist once discharged.  Follow up with your primary care physician in 1-2 weeks or sooner should any new concerns arise.    Increase activity slowly  As directed         Medication List    STOP taking these medications       lisinopril 5 MG tablet  Commonly known as:  PRINIVIL,ZESTRIL      TAKE these medications       albuterol 90 MCG/ACT inhaler  Commonly known as:  PROVENTIL,VENTOLIN  Inhale 2 puffs into the lungs every 4 (four) hours as needed. For shortness of breath     aspirin EC 81 MG tablet  Take 81 mg by mouth daily.     calcium carbonate 500 MG chewable tablet  Commonly known as:  TUMS - dosed in mg elemental calcium  Chew 1 tablet by mouth daily.     cefdinir 300 MG capsule  Commonly known as:  OMNICEF  Take 1 capsule (300 mg total) by mouth 2 (two) times daily.     cinacalcet 30 MG tablet  Commonly known as:  SENSIPAR  Take 30 mg by mouth daily.     dextromethorphan-guaiFENesin 30-600 MG per 12 hr tablet  Commonly known as:  MUCINEX DM  Take 1 tablet by mouth 2 (two) times daily.     dicyclomine 10 MG capsule  Commonly known as:  BENTYL  Take 10 mg by mouth every 6 (six) hours as needed (for stomach pain).     docusate sodium 100 MG capsule  Commonly known as:  COLACE  Take 100 mg by mouth 2 (two) times daily.     doxercalciferol 4 MCG/2ML injection  Commonly known as:  HECTOROL  Inject 4 mLs (8 mcg  total) into the vein Every Tuesday,Thursday,and Saturday with dialysis.     DULoxetine 60 MG capsule  Commonly known as:  CYMBALTA  Take 60 mg by mouth daily.     gabapentin 300 MG capsule  Commonly known as:  NEURONTIN  Take 300 mg by mouth daily.     insulin detemir 100 UNIT/ML injection  Commonly known as:  LEVEMIR  Inject 5 Units into the skin at bedtime as needed. Uses only if blood sugar levels are over 150     levothyroxine 150 MCG tablet  Commonly known as:  SYNTHROID, LEVOTHROID  Take  150 mcg by mouth daily before breakfast.     metoprolol tartrate 25 MG tablet  Commonly known as:  LOPRESSOR  - Take 25 mg by mouth See admin instructions. Take 1 tablet at bedtime on dialysis days-Tues,Thurs sat.  - Takes 1 tablet twice daily on Mon,wednesday, friday and sunday     multivitamin Tabs tablet  Take 1 tablet by mouth daily.     nicotine 7 mg/24hr patch  Commonly known as:  NICODERM CQ - dosed in mg/24 hr  Place 1 patch onto the skin daily.     nitroGLYCERIN 0.4 MG SL tablet  Commonly known as:  NITROSTAT  Place 1 tablet (0.4 mg total) under the tongue every 5 (five) minutes as needed for chest pain (for severe chest pressure, tightness).     omeprazole 20 MG capsule  Commonly known as:  PRILOSEC  Take 20 mg by mouth as needed (acid reflux).     polyethylene glycol packet  Commonly known as:  MIRALAX / GLYCOLAX  Take 17 g by mouth as needed (constipation).     promethazine 12.5 MG tablet  Commonly known as:  PHENERGAN  Take 12.5 mg by mouth every 6 (six) hours as needed. For nausea     sucralfate 1 G tablet  Commonly known as:  CARAFATE  Take 1 g by mouth daily.     tacrolimus 1 MG capsule  Commonly known as:  PROGRAF  Take 5 mg by mouth 2 (two) times daily.     traMADol 50 MG tablet  Commonly known as:  ULTRAM  Take 50 mg by mouth every 6 (six) hours as needed. pain       Allergies  Allergen Reactions  . Acetaminophen Other (See Comments)    Liver  problems  . Codeine Itching      The results of significant diagnostics from this hospitalization (including imaging, microbiology, ancillary and laboratory) are listed below for reference.    Significant Diagnostic Studies: Dg Chest 2 View  08/30/2012   *RADIOLOGY REPORT*  Clinical Data: Shortness of breath.  Pneumonia.  Pulmonary edema.  CHEST - 2 VIEW  Comparison: 08/30/2012 and 07/28/2012 and 07/20/2012  Findings: There are small bilateral pleural effusions.  There is increased pulmonary vascular congestion with bilateral interstitial pulmonary edema.  Heart size is within normal limits.  No osseous abnormality.  IMPRESSION: Increased pulmonary vascular congestion with recurrent interstitial pulmonary edema and new small bilateral effusions.   Original Report Authenticated By: Francene Boyers, M.D.    Microbiology: Recent Results (from the past 240 hour(s))  CULTURE, BLOOD (ROUTINE X 2)     Status: None   Collection Time    08/30/12  7:50 AM      Result Value Range Status   Specimen Description BLOOD RIGHT ARM   Final   Special Requests BOTTLES DRAWN AEROBIC ONLY 4CC   Final   Culture  Setup Time 08/30/2012 13:50   Final   Culture     Final   Value:        BLOOD CULTURE RECEIVED NO GROWTH TO DATE CULTURE WILL BE HELD FOR 5 DAYS BEFORE ISSUING A FINAL NEGATIVE REPORT   Report Status PENDING   Incomplete  CULTURE, BLOOD (ROUTINE X 2)     Status: None   Collection Time    08/30/12  8:00 AM      Result Value Range Status   Specimen Description BLOOD LEFT HAND   Final   Special Requests BOTTLES DRAWN AEROBIC ONLY 4CC  Final   Culture  Setup Time 08/30/2012 13:50   Final   Culture     Final   Value:        BLOOD CULTURE RECEIVED NO GROWTH TO DATE CULTURE WILL BE HELD FOR 5 DAYS BEFORE ISSUING A FINAL NEGATIVE REPORT   Report Status PENDING   Incomplete     Labs: Basic Metabolic Panel:  Recent Labs Lab 08/30/12 0757 08/30/12 1449  NA  --  134*  K  --  4.6  CL  --  91*  CO2   --  24  GLUCOSE  --  196*  BUN  --  49*  CREATININE 8.61* 9.05*  CALCIUM  --  9.6  PHOS  --  8.7*   Liver Function Tests:  Recent Labs Lab 08/30/12 1449  ALBUMIN 2.8*   No results found for this basename: LIPASE, AMYLASE,  in the last 168 hours No results found for this basename: AMMONIA,  in the last 168 hours CBC:  Recent Labs Lab 08/30/12 0757 08/30/12 1449  WBC 6.7 7.3  HGB 10.2* 10.7*  HCT 30.8* 31.5*  MCV 97.8 96.3  PLT 138* 151   Cardiac Enzymes:  Recent Labs Lab 08/30/12 0758 08/30/12 1400 08/30/12 2033  TROPONINI <0.30 <0.30 <0.30   BNP: BNP (last 3 results)  Recent Labs  07/20/12 0154 08/30/12 0758 09/01/12 0555  PROBNP 42149.0* 41152.0* 32077.0*   CBG:  Recent Labs Lab 08/31/12 1144 08/31/12 1803 08/31/12 2207 09/01/12 0805 09/01/12 1148  GLUCAP 278* 135* 289* 235* 315*       Signed:  Penny Pia  [facility] 09/01/2012, 2:16 PM

## 2012-09-01 NOTE — Progress Notes (Signed)
[facility]  Subjective: Pt reports subjective improvement. No SOB at rest, but has DOE. She continues to sleep with head of bead elevated. She continues to have productive cough, but notes that the amount of sputum production has decreased. The color of sputum is also less yellow in color. No further fever, chills or nausea.   Objective: Vital signs in last 24 hours: Temp:  [98 F (36.7 C)-98.4 F (36.9 C)] 98 F (36.7 C) (07/23 0520) Pulse Rate:  [75-82] 79 (07/23 0520) Resp:  [14-20] 20 (07/23 0520) BP: (91-162)/(59-82) 148/82 mmHg (07/23 0520) SpO2:  [96 %-100 %] 99 % (07/23 0520) Weight:  [143 lb 4.8 oz (65 kg)-150 lb 2.1 oz (68.1 kg)] 143 lb 11.8 oz (65.2 kg) (07/22 2213) Last BM Date: 08/31/12  Intake/Output from previous day: 07/22 0701 - 07/23 0700 In: 240 [P.O.:240] Out: 3000  Intake/Output this shift:    Medications Current Facility-Administered Medications  Medication Dose Route Frequency Provider Last Rate Last Dose  . albuterol (PROVENTIL) (5 MG/ML) 0.5% nebulizer solution 2.5 mg  2.5 mg Nebulization Q6H PRN Nishant Dhungel, MD      . aspirin EC tablet 81 mg  81 mg Oral Daily Doree Albee, MD   81 mg at 08/31/12 1824  . calcium carbonate (TUMS - dosed in mg elemental calcium) chewable tablet 600 mg of elemental calcium  3 tablet Oral QAC breakfast Kerin Salen, PA-C   600 mg of elemental calcium at 09/01/12 0817  . ceFEPIme (MAXIPIME) 2 g in dextrose 5 % 50 mL IVPB  2 g Intravenous Q T,Th,Sat-1800 Colleen Can, RPH   2 g at 08/31/12 1825  . cinacalcet (SENSIPAR) tablet 30 mg  30 mg Oral Q breakfast Doree Albee, MD   30 mg at 09/01/12 0817  . dextromethorphan-guaiFENesin (MUCINEX DM) 30-600 MG per 12 hr tablet 1 tablet  1 tablet Oral BID Kerin Salen, PA-C   1 tablet at 08/31/12 2153  . dicyclomine (BENTYL) capsule 10 mg  10 mg Oral Q6H PRN Doree Albee, MD      . docusate sodium (COLACE) capsule 100 mg  100 mg Oral BID  Doree Albee, MD   100 mg at 08/31/12 2151  . [START ON 09/02/2012] doxercalciferol (HECTOROL) injection 6 mcg  6 mcg Intravenous Q T,Th,Sa-HD Kerin Salen, PA-C   6 mcg at 08/31/12 1502  . DULoxetine (CYMBALTA) DR capsule 60 mg  60 mg Oral Daily Doree Albee, MD   60 mg at 08/31/12 2025  . gabapentin (NEURONTIN) capsule 300 mg  300 mg Oral QHS Lauren Bajbus, RPH   300 mg at 08/31/12 2151  . heparin injection 5,000 Units  5,000 Units Subcutaneous Q8H Doree Albee, MD   5,000 Units at 09/01/12 0520  . insulin aspart (novoLOG) injection 0-15 Units  0-15 Units Subcutaneous TID WC Leanne Chang, NP   5 Units at 09/01/12 0818  . insulin aspart (novoLOG) injection 0-5 Units  0-5 Units Subcutaneous QHS Leanne Chang, NP   3 Units at 08/31/12 2234  . insulin detemir (LEVEMIR) injection 3 Units  3 Units Subcutaneous Daily Doree Albee, MD   3 Units at 08/31/12 1021  . levothyroxine (SYNTHROID, LEVOTHROID) tablet 150 mcg  150 mcg Oral QAC breakfast Doree Albee, MD   150 mcg at 08/31/12 1824  . methylPREDNISolone sodium succinate (SOLU-MEDROL) 125 mg/2 mL injection 60 mg  60 mg Intravenous Q8H Nishant Dhungel, MD      . metoprolol tartrate (  LOPRESSOR) tablet 12.5 mg  12.5 mg Oral Custom Trevor Iha, MD   12.5 mg at 08/31/12 2239  . metoprolol tartrate (LOPRESSOR) tablet 25 mg  25 mg Oral Custom Lynden Oxford, MD   25 mg at 08/30/12 2218  . multivitamin (RENA-VIT) tablet 1 tablet  1 tablet Oral QHS Lauren Bajbus, RPH      . nicotine (NICODERM CQ - dosed in mg/24 hours) patch 14 mg  14 mg Transdermal Daily Nishant Dhungel, MD   14 mg at 08/31/12 1020  . nitroGLYCERIN (NITROSTAT) SL tablet 0.4 mg  0.4 mg Sublingual Q5 min PRN Doree Albee, MD      . oxyCODONE (Oxy IR/ROXICODONE) immediate release tablet 5 mg  5 mg Oral Q6H PRN Nishant Dhungel, MD   5 mg at 09/01/12 0115  . pantoprazole (PROTONIX) EC tablet 40 mg  40 mg Oral Daily Doree Albee, MD   40 mg at 08/31/12 1824  . polyethylene  glycol (MIRALAX / GLYCOLAX) packet 17 g  17 g Oral BID Doree Albee, MD   17 g at 08/31/12 2151  . promethazine (PHENERGAN) tablet 12.5 mg  12.5 mg Oral Q6H PRN Doree Albee, MD      . senna [facility]) tablet 8.6 mg  1 tablet Oral QPM Doree Albee, MD   8.6 mg at 08/31/12 1824  . tacrolimus (PROGRAF) capsule 5 mg  5 mg Oral BID Doree Albee, MD   5 mg at 08/31/12 2152  . traMADol (ULTRAM) tablet 50 mg  50 mg Oral Q6H PRN Doree Albee, MD   50 mg at 08/30/12 2117  . vancomycin (VANCOCIN) IVPB 750 mg/150 ml premix  750 mg Intravenous Q T,Th,Sa-HD Colleen Can, RPH   750 mg at 08/31/12 2022    PE: General appearance: alert, cooperative and no distress Lungs: decreased breath sounds bilateral, no wheezing Heart: regular rate and rhythm and 2/6 murmur Extremities: no LEE Pulses: 2+ and symmetric Skin: warm and dry Neurologic: Grossly normal  Lab Results:   Recent Labs  08/30/12 0757 08/30/12 1449  WBC 6.7 7.3  HGB 10.2* 10.7*  HCT 30.8* 31.5*  PLT 138* 151   BMET  Recent Labs  08/30/12 0757 08/30/12 1449  NA  --  134*  K  --  4.6  CL  --  91*  CO2  --  24  GLUCOSE  --  196*  BUN  --  49*  CREATININE 8.61* 9.05*  CALCIUM  --  9.6   BNP (last 3 results)  Recent Labs  07/20/12 0154 08/30/12 0758 09/01/12 0555  PROBNP 42149.0* 41152.0* 32077.0*   Filed Weights   08/31/12 1330 08/31/12 1749 08/31/12 2213  Weight: 150 lb 2.1 oz (68.1 kg) 143 lb 4.8 oz (65 kg) 143 lb 11.8 oz (65.2 kg)    Assessment/Plan  Principal Problem:   Acute combined systolic and diastolic heart failure Active Problems:   HEPATITIS C- s/p liver transplant 2011   HYPOTHYROIDISM   DIABETES MELLITUS, TYPE II   HTN- LVH, EF45- 50%. garde 2 diastolic dysf. 07/28/12   End stage renal disease on dialysis   CAD- moderate with 40% - 50% LAD 07/29/12   Aortic stenosis, "moderate" per Dr C by 2D ehco 07/2012.    Dyslipidemia-LDL 104, not on statin with Hx of liver transplant   HCAP  (healthcare-associated pneumonia)   COPD exacerbation  Plan: CHF has improved with volume removal from dialysis. BNP remains elevated, but has improved by 10K, down from 42,149 initially to  32,077. Weight is down 7 lbs. Pt encouraged to continue with ambulation and to decrease incline of head of bed to see if breathing is improved. Continue with HD per nephrology. Continue antibiotics and breathing treatments for HCAP/ COPD exacerbation. Will continue to follow.     LOS: 2 days    Shawnta Schlegel M. Delmer Islam 09/01/2012 8:47 AM

## 2012-09-02 DIAGNOSIS — N2581 Secondary hyperparathyroidism of renal origin: Secondary | ICD-10-CM | POA: Diagnosis not present

## 2012-09-02 DIAGNOSIS — D631 Anemia in chronic kidney disease: Secondary | ICD-10-CM | POA: Diagnosis not present

## 2012-09-02 DIAGNOSIS — Z944 Liver transplant status: Secondary | ICD-10-CM | POA: Diagnosis not present

## 2012-09-02 DIAGNOSIS — E1165 Type 2 diabetes mellitus with hyperglycemia: Secondary | ICD-10-CM | POA: Diagnosis not present

## 2012-09-02 DIAGNOSIS — N186 End stage renal disease: Secondary | ICD-10-CM | POA: Diagnosis not present

## 2012-09-02 DIAGNOSIS — N039 Chronic nephritic syndrome with unspecified morphologic changes: Secondary | ICD-10-CM | POA: Diagnosis not present

## 2012-09-02 DIAGNOSIS — D509 Iron deficiency anemia, unspecified: Secondary | ICD-10-CM | POA: Diagnosis not present

## 2012-09-02 NOTE — Progress Notes (Signed)
Pt discharged to home after visit summary reviewed and pt capable of re verbalizing medications and follow up appointments. Pt remains stable. No signs and symptoms of distress. Educated to return to ER in the event of SOB, dizziness, chest pain, or fainting. Samanthan Dugo, RN   

## 2012-09-04 DIAGNOSIS — N186 End stage renal disease: Secondary | ICD-10-CM | POA: Diagnosis not present

## 2012-09-04 DIAGNOSIS — D631 Anemia in chronic kidney disease: Secondary | ICD-10-CM | POA: Diagnosis not present

## 2012-09-04 DIAGNOSIS — N2581 Secondary hyperparathyroidism of renal origin: Secondary | ICD-10-CM | POA: Diagnosis not present

## 2012-09-04 DIAGNOSIS — D509 Iron deficiency anemia, unspecified: Secondary | ICD-10-CM | POA: Diagnosis not present

## 2012-09-05 LAB — CULTURE, BLOOD (ROUTINE X 2)
Culture: NO GROWTH
Culture: NO GROWTH

## 2012-09-07 DIAGNOSIS — N2581 Secondary hyperparathyroidism of renal origin: Secondary | ICD-10-CM | POA: Diagnosis not present

## 2012-09-07 DIAGNOSIS — I129 Hypertensive chronic kidney disease with stage 1 through stage 4 chronic kidney disease, or unspecified chronic kidney disease: Secondary | ICD-10-CM | POA: Diagnosis not present

## 2012-09-07 DIAGNOSIS — R072 Precordial pain: Secondary | ICD-10-CM | POA: Diagnosis not present

## 2012-09-07 DIAGNOSIS — N186 End stage renal disease: Secondary | ICD-10-CM | POA: Diagnosis not present

## 2012-09-07 DIAGNOSIS — D509 Iron deficiency anemia, unspecified: Secondary | ICD-10-CM | POA: Diagnosis not present

## 2012-09-07 DIAGNOSIS — I509 Heart failure, unspecified: Secondary | ICD-10-CM | POA: Diagnosis not present

## 2012-09-07 DIAGNOSIS — K219 Gastro-esophageal reflux disease without esophagitis: Secondary | ICD-10-CM | POA: Diagnosis not present

## 2012-09-07 DIAGNOSIS — R079 Chest pain, unspecified: Secondary | ICD-10-CM | POA: Diagnosis not present

## 2012-09-07 DIAGNOSIS — J449 Chronic obstructive pulmonary disease, unspecified: Secondary | ICD-10-CM | POA: Diagnosis not present

## 2012-09-07 DIAGNOSIS — Z992 Dependence on renal dialysis: Secondary | ICD-10-CM | POA: Diagnosis not present

## 2012-09-07 DIAGNOSIS — Z79899 Other long term (current) drug therapy: Secondary | ICD-10-CM | POA: Diagnosis not present

## 2012-09-07 DIAGNOSIS — E119 Type 2 diabetes mellitus without complications: Secondary | ICD-10-CM | POA: Diagnosis not present

## 2012-09-07 DIAGNOSIS — N189 Chronic kidney disease, unspecified: Secondary | ICD-10-CM | POA: Diagnosis not present

## 2012-09-07 DIAGNOSIS — E7800 Pure hypercholesterolemia, unspecified: Secondary | ICD-10-CM | POA: Diagnosis not present

## 2012-09-07 DIAGNOSIS — Z7982 Long term (current) use of aspirin: Secondary | ICD-10-CM | POA: Diagnosis not present

## 2012-09-07 DIAGNOSIS — N039 Chronic nephritic syndrome with unspecified morphologic changes: Secondary | ICD-10-CM | POA: Diagnosis not present

## 2012-09-07 DIAGNOSIS — Z794 Long term (current) use of insulin: Secondary | ICD-10-CM | POA: Diagnosis not present

## 2012-09-09 DIAGNOSIS — D631 Anemia in chronic kidney disease: Secondary | ICD-10-CM | POA: Diagnosis not present

## 2012-09-09 DIAGNOSIS — N186 End stage renal disease: Secondary | ICD-10-CM | POA: Diagnosis not present

## 2012-09-09 DIAGNOSIS — N2581 Secondary hyperparathyroidism of renal origin: Secondary | ICD-10-CM | POA: Diagnosis not present

## 2012-09-09 DIAGNOSIS — D509 Iron deficiency anemia, unspecified: Secondary | ICD-10-CM | POA: Diagnosis not present

## 2012-09-11 DIAGNOSIS — N186 End stage renal disease: Secondary | ICD-10-CM | POA: Diagnosis not present

## 2012-09-11 DIAGNOSIS — N2581 Secondary hyperparathyroidism of renal origin: Secondary | ICD-10-CM | POA: Diagnosis not present

## 2012-09-11 DIAGNOSIS — D631 Anemia in chronic kidney disease: Secondary | ICD-10-CM | POA: Diagnosis not present

## 2012-09-22 DIAGNOSIS — I10 Essential (primary) hypertension: Secondary | ICD-10-CM | POA: Diagnosis not present

## 2012-09-22 DIAGNOSIS — F17200 Nicotine dependence, unspecified, uncomplicated: Secondary | ICD-10-CM | POA: Diagnosis not present

## 2012-09-22 DIAGNOSIS — R000 Tachycardia, unspecified: Secondary | ICD-10-CM | POA: Diagnosis not present

## 2012-10-04 DIAGNOSIS — Z944 Liver transplant status: Secondary | ICD-10-CM | POA: Diagnosis not present

## 2012-10-06 DIAGNOSIS — Z79899 Other long term (current) drug therapy: Secondary | ICD-10-CM | POA: Diagnosis not present

## 2012-10-06 DIAGNOSIS — R05 Cough: Secondary | ICD-10-CM | POA: Diagnosis not present

## 2012-10-06 DIAGNOSIS — Z944 Liver transplant status: Secondary | ICD-10-CM | POA: Diagnosis not present

## 2012-10-10 DIAGNOSIS — N186 End stage renal disease: Secondary | ICD-10-CM | POA: Diagnosis not present

## 2012-10-12 DIAGNOSIS — D509 Iron deficiency anemia, unspecified: Secondary | ICD-10-CM | POA: Diagnosis not present

## 2012-10-12 DIAGNOSIS — N2581 Secondary hyperparathyroidism of renal origin: Secondary | ICD-10-CM | POA: Diagnosis not present

## 2012-10-12 DIAGNOSIS — D631 Anemia in chronic kidney disease: Secondary | ICD-10-CM | POA: Diagnosis not present

## 2012-10-12 DIAGNOSIS — N186 End stage renal disease: Secondary | ICD-10-CM | POA: Diagnosis not present

## 2012-10-14 DIAGNOSIS — N186 End stage renal disease: Secondary | ICD-10-CM | POA: Diagnosis not present

## 2012-10-14 DIAGNOSIS — D631 Anemia in chronic kidney disease: Secondary | ICD-10-CM | POA: Diagnosis not present

## 2012-10-14 DIAGNOSIS — D509 Iron deficiency anemia, unspecified: Secondary | ICD-10-CM | POA: Diagnosis not present

## 2012-10-14 DIAGNOSIS — N2581 Secondary hyperparathyroidism of renal origin: Secondary | ICD-10-CM | POA: Diagnosis not present

## 2012-10-16 DIAGNOSIS — D509 Iron deficiency anemia, unspecified: Secondary | ICD-10-CM | POA: Diagnosis not present

## 2012-10-16 DIAGNOSIS — H9209 Otalgia, unspecified ear: Secondary | ICD-10-CM | POA: Diagnosis not present

## 2012-10-16 DIAGNOSIS — D631 Anemia in chronic kidney disease: Secondary | ICD-10-CM | POA: Diagnosis not present

## 2012-10-16 DIAGNOSIS — N2581 Secondary hyperparathyroidism of renal origin: Secondary | ICD-10-CM | POA: Diagnosis not present

## 2012-10-16 DIAGNOSIS — N186 End stage renal disease: Secondary | ICD-10-CM | POA: Diagnosis not present

## 2012-10-19 DIAGNOSIS — D631 Anemia in chronic kidney disease: Secondary | ICD-10-CM | POA: Diagnosis not present

## 2012-10-19 DIAGNOSIS — N2581 Secondary hyperparathyroidism of renal origin: Secondary | ICD-10-CM | POA: Diagnosis not present

## 2012-10-19 DIAGNOSIS — D509 Iron deficiency anemia, unspecified: Secondary | ICD-10-CM | POA: Diagnosis not present

## 2012-10-19 DIAGNOSIS — N186 End stage renal disease: Secondary | ICD-10-CM | POA: Diagnosis not present

## 2012-10-21 DIAGNOSIS — D631 Anemia in chronic kidney disease: Secondary | ICD-10-CM | POA: Diagnosis not present

## 2012-10-21 DIAGNOSIS — D509 Iron deficiency anemia, unspecified: Secondary | ICD-10-CM | POA: Diagnosis not present

## 2012-10-21 DIAGNOSIS — N2581 Secondary hyperparathyroidism of renal origin: Secondary | ICD-10-CM | POA: Diagnosis not present

## 2012-10-21 DIAGNOSIS — N186 End stage renal disease: Secondary | ICD-10-CM | POA: Diagnosis not present

## 2012-10-23 DIAGNOSIS — D631 Anemia in chronic kidney disease: Secondary | ICD-10-CM | POA: Diagnosis not present

## 2012-10-23 DIAGNOSIS — N186 End stage renal disease: Secondary | ICD-10-CM | POA: Diagnosis not present

## 2012-10-23 DIAGNOSIS — N2581 Secondary hyperparathyroidism of renal origin: Secondary | ICD-10-CM | POA: Diagnosis not present

## 2012-10-23 DIAGNOSIS — D509 Iron deficiency anemia, unspecified: Secondary | ICD-10-CM | POA: Diagnosis not present

## 2012-10-26 DIAGNOSIS — N2581 Secondary hyperparathyroidism of renal origin: Secondary | ICD-10-CM | POA: Diagnosis not present

## 2012-10-26 DIAGNOSIS — D509 Iron deficiency anemia, unspecified: Secondary | ICD-10-CM | POA: Diagnosis not present

## 2012-10-26 DIAGNOSIS — D631 Anemia in chronic kidney disease: Secondary | ICD-10-CM | POA: Diagnosis not present

## 2012-10-26 DIAGNOSIS — N186 End stage renal disease: Secondary | ICD-10-CM | POA: Diagnosis not present

## 2012-10-28 DIAGNOSIS — N2581 Secondary hyperparathyroidism of renal origin: Secondary | ICD-10-CM | POA: Diagnosis not present

## 2012-10-28 DIAGNOSIS — D631 Anemia in chronic kidney disease: Secondary | ICD-10-CM | POA: Diagnosis not present

## 2012-10-28 DIAGNOSIS — N186 End stage renal disease: Secondary | ICD-10-CM | POA: Diagnosis not present

## 2012-10-28 DIAGNOSIS — D509 Iron deficiency anemia, unspecified: Secondary | ICD-10-CM | POA: Diagnosis not present

## 2012-10-30 DIAGNOSIS — D631 Anemia in chronic kidney disease: Secondary | ICD-10-CM | POA: Diagnosis not present

## 2012-10-30 DIAGNOSIS — N2581 Secondary hyperparathyroidism of renal origin: Secondary | ICD-10-CM | POA: Diagnosis not present

## 2012-10-30 DIAGNOSIS — D509 Iron deficiency anemia, unspecified: Secondary | ICD-10-CM | POA: Diagnosis not present

## 2012-10-30 DIAGNOSIS — N186 End stage renal disease: Secondary | ICD-10-CM | POA: Diagnosis not present

## 2012-11-02 ENCOUNTER — Inpatient Hospital Stay (HOSPITAL_COMMUNITY): Payer: Medicare Other

## 2012-11-02 ENCOUNTER — Inpatient Hospital Stay (HOSPITAL_COMMUNITY)
Admission: EM | Admit: 2012-11-02 | Discharge: 2012-11-04 | DRG: 208 | Disposition: A | Discharge status: Home / Self Care | Payer: Medicare Other | Source: Other Acute Inpatient Hospital | Attending: Internal Medicine | Admitting: Internal Medicine

## 2012-11-02 DIAGNOSIS — J811 Chronic pulmonary edema: Secondary | ICD-10-CM | POA: Diagnosis not present

## 2012-11-02 DIAGNOSIS — Z79899 Other long term (current) drug therapy: Secondary | ICD-10-CM

## 2012-11-02 DIAGNOSIS — M1990 Unspecified osteoarthritis, unspecified site: Secondary | ICD-10-CM | POA: Diagnosis present

## 2012-11-02 DIAGNOSIS — Z4682 Encounter for fitting and adjustment of non-vascular catheter: Secondary | ICD-10-CM | POA: Diagnosis not present

## 2012-11-02 DIAGNOSIS — J9600 Acute respiratory failure, unspecified whether with hypoxia or hypercapnia: Principal | ICD-10-CM | POA: Diagnosis present

## 2012-11-02 DIAGNOSIS — K219 Gastro-esophageal reflux disease without esophagitis: Secondary | ICD-10-CM | POA: Diagnosis present

## 2012-11-02 DIAGNOSIS — D631 Anemia in chronic kidney disease: Secondary | ICD-10-CM | POA: Diagnosis present

## 2012-11-02 DIAGNOSIS — Z8701 Personal history of pneumonia (recurrent): Secondary | ICD-10-CM

## 2012-11-02 DIAGNOSIS — I120 Hypertensive chronic kidney disease with stage 5 chronic kidney disease or end stage renal disease: Secondary | ICD-10-CM | POA: Diagnosis present

## 2012-11-02 DIAGNOSIS — E1169 Type 2 diabetes mellitus with other specified complication: Secondary | ICD-10-CM | POA: Diagnosis not present

## 2012-11-02 DIAGNOSIS — E119 Type 2 diabetes mellitus without complications: Secondary | ICD-10-CM | POA: Diagnosis not present

## 2012-11-02 DIAGNOSIS — I10 Essential (primary) hypertension: Secondary | ICD-10-CM

## 2012-11-02 DIAGNOSIS — Z944 Liver transplant status: Secondary | ICD-10-CM | POA: Diagnosis not present

## 2012-11-02 DIAGNOSIS — J810 Acute pulmonary edema: Secondary | ICD-10-CM | POA: Diagnosis not present

## 2012-11-02 DIAGNOSIS — R109 Unspecified abdominal pain: Secondary | ICD-10-CM | POA: Diagnosis not present

## 2012-11-02 DIAGNOSIS — Z992 Dependence on renal dialysis: Secondary | ICD-10-CM | POA: Diagnosis not present

## 2012-11-02 DIAGNOSIS — F3289 Other specified depressive episodes: Secondary | ICD-10-CM | POA: Diagnosis present

## 2012-11-02 DIAGNOSIS — K5730 Diverticulosis of large intestine without perforation or abscess without bleeding: Secondary | ICD-10-CM | POA: Diagnosis present

## 2012-11-02 DIAGNOSIS — E7800 Pure hypercholesterolemia, unspecified: Secondary | ICD-10-CM | POA: Diagnosis not present

## 2012-11-02 DIAGNOSIS — N189 Chronic kidney disease, unspecified: Secondary | ICD-10-CM | POA: Diagnosis not present

## 2012-11-02 DIAGNOSIS — F411 Generalized anxiety disorder: Secondary | ICD-10-CM | POA: Diagnosis present

## 2012-11-02 DIAGNOSIS — N186 End stage renal disease: Secondary | ICD-10-CM | POA: Diagnosis present

## 2012-11-02 DIAGNOSIS — J441 Chronic obstructive pulmonary disease with (acute) exacerbation: Secondary | ICD-10-CM

## 2012-11-02 DIAGNOSIS — I739 Peripheral vascular disease, unspecified: Secondary | ICD-10-CM | POA: Diagnosis present

## 2012-11-02 DIAGNOSIS — F329 Major depressive disorder, single episode, unspecified: Secondary | ICD-10-CM | POA: Diagnosis present

## 2012-11-02 DIAGNOSIS — I2510 Atherosclerotic heart disease of native coronary artery without angina pectoris: Secondary | ICD-10-CM | POA: Diagnosis present

## 2012-11-02 DIAGNOSIS — K5900 Constipation, unspecified: Secondary | ICD-10-CM | POA: Diagnosis present

## 2012-11-02 DIAGNOSIS — I5041 Acute combined systolic (congestive) and diastolic (congestive) heart failure: Secondary | ICD-10-CM | POA: Diagnosis not present

## 2012-11-02 DIAGNOSIS — E039 Hypothyroidism, unspecified: Secondary | ICD-10-CM | POA: Diagnosis present

## 2012-11-02 DIAGNOSIS — B1710 Acute hepatitis C without hepatic coma: Secondary | ICD-10-CM

## 2012-11-02 DIAGNOSIS — D539 Nutritional anemia, unspecified: Secondary | ICD-10-CM

## 2012-11-02 DIAGNOSIS — J984 Other disorders of lung: Secondary | ICD-10-CM | POA: Diagnosis not present

## 2012-11-02 DIAGNOSIS — M899 Disorder of bone, unspecified: Secondary | ICD-10-CM | POA: Diagnosis present

## 2012-11-02 DIAGNOSIS — R0602 Shortness of breath: Secondary | ICD-10-CM | POA: Diagnosis not present

## 2012-11-02 DIAGNOSIS — I5023 Acute on chronic systolic (congestive) heart failure: Secondary | ICD-10-CM | POA: Diagnosis present

## 2012-11-02 DIAGNOSIS — E669 Obesity, unspecified: Secondary | ICD-10-CM | POA: Diagnosis present

## 2012-11-02 DIAGNOSIS — E8779 Other fluid overload: Secondary | ICD-10-CM | POA: Diagnosis not present

## 2012-11-02 DIAGNOSIS — J449 Chronic obstructive pulmonary disease, unspecified: Secondary | ICD-10-CM | POA: Diagnosis not present

## 2012-11-02 DIAGNOSIS — I350 Nonrheumatic aortic (valve) stenosis: Secondary | ICD-10-CM

## 2012-11-02 DIAGNOSIS — J9621 Acute and chronic respiratory failure with hypoxia: Secondary | ICD-10-CM

## 2012-11-02 DIAGNOSIS — I129 Hypertensive chronic kidney disease with stage 1 through stage 4 chronic kidney disease, or unspecified chronic kidney disease: Secondary | ICD-10-CM | POA: Diagnosis not present

## 2012-11-02 DIAGNOSIS — R9431 Abnormal electrocardiogram [ECG] [EKG]: Secondary | ICD-10-CM

## 2012-11-02 DIAGNOSIS — E785 Hyperlipidemia, unspecified: Secondary | ICD-10-CM

## 2012-11-02 DIAGNOSIS — J189 Pneumonia, unspecified organism: Secondary | ICD-10-CM

## 2012-11-02 DIAGNOSIS — J42 Unspecified chronic bronchitis: Secondary | ICD-10-CM | POA: Diagnosis present

## 2012-11-02 LAB — CBC WITH DIFFERENTIAL/PLATELET
Basophils Relative: 0 % (ref 0–1)
Eosinophils Relative: 0 % (ref 0–5)
HCT: 33 % — ABNORMAL LOW (ref 36.0–46.0)
Hemoglobin: 11.1 g/dL — ABNORMAL LOW (ref 12.0–15.0)
Lymphs Abs: 1.6 10*3/uL (ref 0.7–4.0)
MCH: 32 pg (ref 26.0–34.0)
MCHC: 33.6 g/dL (ref 30.0–36.0)
MCV: 95.1 fL (ref 78.0–100.0)
Monocytes Absolute: 1.1 10*3/uL — ABNORMAL HIGH (ref 0.1–1.0)
Monocytes Relative: 9 % (ref 3–12)
Neutro Abs: 9 10*3/uL — ABNORMAL HIGH (ref 1.7–7.7)
RBC: 3.47 MIL/uL — ABNORMAL LOW (ref 3.87–5.11)
RDW: 14.9 % (ref 11.5–15.5)

## 2012-11-02 LAB — URINALYSIS, ROUTINE W REFLEX MICROSCOPIC
Bilirubin Urine: NEGATIVE
Glucose, UA: 250 mg/dL — AB
Ketones, ur: NEGATIVE mg/dL
Protein, ur: >300 mg/dL — AB
Urobilinogen, UA: 0.2 mg/dL (ref 0.0–1.0)

## 2012-11-02 LAB — COMPREHENSIVE METABOLIC PANEL
Albumin: 2.5 g/dL — ABNORMAL LOW (ref 3.5–5.2)
BUN: 81 mg/dL — ABNORMAL HIGH (ref 6–23)
Calcium: 8 mg/dL — ABNORMAL LOW (ref 8.4–10.5)
Chloride: 94 mEq/L — ABNORMAL LOW (ref 96–112)
Creatinine, Ser: 11.35 mg/dL — ABNORMAL HIGH (ref 0.50–1.10)
GFR calc non Af Amer: 3 mL/min — ABNORMAL LOW (ref >90)
Total Bilirubin: 0.4 mg/dL (ref 0.3–1.2)

## 2012-11-02 LAB — URINE MICROSCOPIC-ADD ON

## 2012-11-02 LAB — POCT I-STAT 3, ART BLOOD GAS (G3+)
Acid-Base Excess: 1 mmol/L (ref 0.0–2.0)
O2 Saturation: 100 %
Patient temperature: 99.3
TCO2: 24 mmol/L (ref 0–100)
pH, Arterial: 7.511 — ABNORMAL HIGH (ref 7.350–7.450)

## 2012-11-02 LAB — GLUCOSE, CAPILLARY
Glucose-Capillary: 114 mg/dL — ABNORMAL HIGH (ref 70–99)
Glucose-Capillary: 133 mg/dL — ABNORMAL HIGH (ref 70–99)
Glucose-Capillary: 136 mg/dL — ABNORMAL HIGH (ref 70–99)
Glucose-Capillary: 138 mg/dL — ABNORMAL HIGH (ref 70–99)
Glucose-Capillary: 94 mg/dL (ref 70–99)

## 2012-11-02 LAB — PHOSPHORUS: Phosphorus: 7.1 mg/dL — ABNORMAL HIGH (ref 2.3–4.6)

## 2012-11-02 LAB — MRSA PCR SCREENING: MRSA by PCR: NEGATIVE

## 2012-11-02 LAB — MAGNESIUM: Magnesium: 2.1 mg/dL (ref 1.5–2.5)

## 2012-11-02 LAB — TROPONIN I: Troponin I: <0.3 ng/mL (ref <0.30)

## 2012-11-02 MED ORDER — PNEUMOCOCCAL VAC POLYVALENT 25 MCG/0.5ML IJ INJ
0.5000 mL | INJECTION | INTRAMUSCULAR | Status: AC | PRN
  Administered 2012-11-04: 0.5 mL via INTRAMUSCULAR
  Filled 2012-11-02: qty 0.5

## 2012-11-02 MED ORDER — FENTANYL CITRATE 0.05 MG/ML IJ SOLN
100.0000 ug | INTRAMUSCULAR | Status: DC | PRN
  Administered 2012-11-02: 100 ug via INTRAVENOUS
  Filled 2012-11-02: qty 2

## 2012-11-02 MED ORDER — LEVOTHYROXINE SODIUM 150 MCG PO TABS
150.0000 ug | ORAL_TABLET | Freq: Every day | ORAL | Status: DC
  Administered 2012-11-02 – 2012-11-03 (×2): 150 ug via ORAL
  Filled 2012-11-02 (×3): qty 1

## 2012-11-02 MED ORDER — CINACALCET HCL 30 MG PO TABS
30.0000 mg | ORAL_TABLET | Freq: Every day | ORAL | Status: DC
  Administered 2012-11-02: 30 mg via ORAL
  Filled 2012-11-02 (×2): qty 1

## 2012-11-02 MED ORDER — BIOTENE DRY MOUTH MT LIQD: 15.0000 mL | Freq: Four times a day (QID) | OROMUCOSAL | Status: DC

## 2012-11-02 MED ORDER — CHLORHEXIDINE GLUCONATE 0.12 % MT SOLN
15.0000 mL | Freq: Two times a day (BID) | OROMUCOSAL | Status: DC
  Administered 2012-11-02: 15 mL via OROMUCOSAL
  Filled 2012-11-02 (×2): qty 15

## 2012-11-02 MED ORDER — TACROLIMUS 1 MG PO CAPS: 5.0000 mg | ORAL_CAPSULE | Freq: Two times a day (BID) | ORAL | Status: DC

## 2012-11-02 MED ORDER — TACROLIMUS 1 MG/ML ORAL SUSPENSION
5.0000 mg | Freq: Two times a day (BID) | ORAL | Status: DC
  Administered 2012-11-02 – 2012-11-03 (×3): 5 mg via ORAL
  Filled 2012-11-02 (×6): qty 5

## 2012-11-02 MED ORDER — ALTEPLASE 2 MG IJ SOLR
2.0000 mg | Freq: Once | INTRAMUSCULAR | Status: AC | PRN
  Filled 2012-11-02: qty 2

## 2012-11-02 MED ORDER — BIOTENE DRY MOUTH MT LIQD
1.0000 "application " | Freq: Four times a day (QID) | OROMUCOSAL | Status: DC
  Administered 2012-11-02 – 2012-11-03 (×3): 15 mL via OROMUCOSAL

## 2012-11-02 MED ORDER — CHLORHEXIDINE GLUCONATE 0.12 % MT SOLN
15.0000 mL | Freq: Two times a day (BID) | OROMUCOSAL | Status: DC
  Administered 2012-11-02 – 2012-11-03 (×3): 15 mL via OROMUCOSAL
  Filled 2012-11-02 (×2): qty 15

## 2012-11-02 MED ORDER — NA FERRIC GLUC CPLX IN SUCROSE 12.5 MG/ML IV SOLN
62.5000 mg | INTRAVENOUS | Status: DC
  Administered 2012-11-04: 62.5 mg via INTRAVENOUS
  Filled 2012-11-02 (×2): qty 5

## 2012-11-02 MED ORDER — LABETALOL HCL 5 MG/ML IV SOLN: 10.0000 mg | INTRAVENOUS | Status: DC | PRN

## 2012-11-02 MED ORDER — CALCIUM CARBONATE ANTACID 500 MG PO CHEW
1.0000 | CHEWABLE_TABLET | Freq: Every day | ORAL | Status: DC
Start: 2012-11-02 — End: 2012-11-02
  Filled 2012-11-02: qty 1

## 2012-11-02 MED ORDER — INSULIN ASPART 100 UNIT/ML ~~LOC~~ SOLN
0.0000 [IU] | SUBCUTANEOUS | Status: DC
  Administered 2012-11-02 (×3): 2 [IU] via SUBCUTANEOUS
  Administered 2012-11-03: 5 [IU] via SUBCUTANEOUS
  Administered 2012-11-03: 2 [IU] via SUBCUTANEOUS
  Administered 2012-11-03: 8 [IU] via SUBCUTANEOUS

## 2012-11-02 MED ORDER — NEPRO/CARBSTEADY PO LIQD
237.0000 mL | ORAL | Status: DC | PRN
  Filled 2012-11-02: qty 237

## 2012-11-02 MED ORDER — LIDOCAINE HCL (PF) 1 % IJ SOLN: 5.0000 mL | INTRAMUSCULAR | Status: DC | PRN

## 2012-11-02 MED ORDER — SODIUM CHLORIDE 0.9 % IV SOLN: 100.0000 mL | INTRAVENOUS | Status: DC | PRN

## 2012-11-02 MED ORDER — DOXERCALCIFEROL 4 MCG/2ML IV SOLN
2.0000 ug | Freq: Once | INTRAVENOUS | Status: AC
  Administered 2012-11-02: 2 ug via INTRAVENOUS
  Filled 2012-11-02 (×2): qty 2

## 2012-11-02 MED ORDER — ALBUTEROL SULFATE HFA 108 (90 BASE) MCG/ACT IN AERS: 6.0000 | INHALATION_SPRAY | RESPIRATORY_TRACT | Status: DC | PRN

## 2012-11-02 MED ORDER — PENTAFLUOROPROP-TETRAFLUOROETH EX AERO: 1.0000 "application " | INHALATION_SPRAY | CUTANEOUS | Status: DC | PRN

## 2012-11-02 MED ORDER — LIDOCAINE-PRILOCAINE 2.5-2.5 % EX CREA
1.0000 "application " | TOPICAL_CREAM | CUTANEOUS | Status: DC | PRN
  Filled 2012-11-02: qty 5

## 2012-11-02 MED ORDER — INFLUENZA VAC SPLIT QUAD 0.5 ML IM SUSP
0.5000 mL | INTRAMUSCULAR | Status: AC | PRN
  Administered 2012-11-04: 0.5 mL via INTRAMUSCULAR
  Filled 2012-11-02: qty 0.5

## 2012-11-02 MED ORDER — ASPIRIN 81 MG PO CHEW
81.0000 mg | CHEWABLE_TABLET | Freq: Every day | ORAL | Status: DC
  Administered 2012-11-02 – 2012-11-03 (×2): 81 mg via ORAL
  Filled 2012-11-02 (×2): qty 1

## 2012-11-02 MED ORDER — CHLORHEXIDINE GLUCONATE 0.12 % MT SOLN: 15.0000 mL | Freq: Two times a day (BID) | OROMUCOSAL | Status: DC

## 2012-11-02 MED ORDER — PANTOPRAZOLE SODIUM 40 MG IV SOLR
40.0000 mg | INTRAVENOUS | Status: DC
  Administered 2012-11-02 – 2012-11-03 (×2): 40 mg via INTRAVENOUS
  Filled 2012-11-02 (×2): qty 40

## 2012-11-02 MED ORDER — PROPOFOL 10 MG/ML IV EMUL
5.0000 ug/kg/min | INTRAVENOUS | Status: DC
  Administered 2012-11-02: 20 ug/kg/min via INTRAVENOUS

## 2012-11-02 MED ORDER — SODIUM CHLORIDE 0.9 % IV SOLN: INTRAVENOUS | Status: DC

## 2012-11-02 MED ORDER — FENTANYL CITRATE 0.05 MG/ML IJ SOLN
50.0000 ug | INTRAMUSCULAR | Status: DC | PRN
  Administered 2012-11-02: 50 ug via INTRAVENOUS
  Administered 2012-11-02 (×2): 100 ug via INTRAVENOUS
  Filled 2012-11-02 (×4): qty 2

## 2012-11-02 MED ORDER — HEPARIN SODIUM (PORCINE) 5000 UNIT/ML IJ SOLN
5000.0000 [IU] | Freq: Three times a day (TID) | INTRAMUSCULAR | Status: DC
  Administered 2012-11-02 – 2012-11-04 (×6): 5000 [IU] via SUBCUTANEOUS
  Filled 2012-11-02 (×9): qty 1

## 2012-11-02 MED ORDER — HEPARIN SODIUM (PORCINE) 1000 UNIT/ML DIALYSIS: 1000.0000 [IU] | INTRAMUSCULAR | Status: DC | PRN

## 2012-11-02 MED ORDER — DULOXETINE HCL 60 MG PO CPEP
60.0000 mg | ORAL_CAPSULE | Freq: Every day | ORAL | Status: DC
  Administered 2012-11-02: 60 mg via ORAL
  Filled 2012-11-02: qty 1

## 2012-11-02 MED ORDER — ASPIRIN EC 81 MG PO TBEC
81.0000 mg | DELAYED_RELEASE_TABLET | Freq: Every day | ORAL | Status: DC
  Filled 2012-11-02: qty 1

## 2012-11-02 NOTE — Progress Notes (Addendum)
Renal Service Daily Progress Note [facility]  Subjective: Extubated, up in chair, says she doesn't drink much fluids, not sure why she is having this problem  Physical Exam:  Blood pressure 154/72, pulse 88, temperature 98.6 F (37 C), temperature source Oral, resp. rate 10, height 5\' 4"  (1.626 m), weight 74 kg (163 lb 2.3 oz), SpO2 99.00%. Gen: on vent, sedated  Neck: +JVD Chest: clear bilat CV: regular, distant HS, no rub or gallop noted  Abdomen: soft, nontender, no HSM or ascites  Ext: no LE or UE edema  Neuro: alert, nonfocal Access: L thigh AVG patent  CXR- improved edema, still wet though  Outpatient HD: (TTS at [place])  4hrs    72kg    2K/2.25Ca     AVG L thigh    Profile linear     400/A1.5    Heparin none  Hectorol 2ug    EPO none     Venofer 50mg /wk   Assessment/Plan:  1. Acute resp failure / pulm edema- off vent, improved but still has vol xs, plan short HD today and again tomorrow to reduce volume, try to reduce dry wt 2. ESRD- usual HD TTS 3. Anemia of CKD- no epo, Hb 11 4. Metabolic bone disease- cont vit D, sensipar 5. Hypertension/volume- metoprolol,  6. Hx liver transplant 2011 / hep CLily Lovings  MD Pager [phone]    Cell  [phone] 11/03/2012, 10:30 AM    Recent Labs Lab 11/02/12 0825 11/03/12 0515  NA 136 142  K 5.1 4.1  CL 94* 100  CO2 21 25  GLUCOSE 157* 102*  BUN 81* 32*  CREATININE 11.35* 6.37*  CALCIUM 8.0* 8.1*  PHOS 7.1* 5.9*    Recent Labs Lab 11/02/12 0825  AST 23  ALT 16  ALKPHOS 80  BILITOT 0.4  PROT 6.2  ALBUMIN 2.5*    Recent Labs Lab 11/02/12 0825 11/03/12 0515  WBC 11.7* 7.4  NEUTROABS 9.0* 5.0  HGB 11.1* 11.0*  HCT 33.0* 32.5*  MCV 95.1 96.4  PLT 102* 89*   . antiseptic oral rinse  1 application Mouth Rinse QID  . aspirin  81 mg Oral Daily  . chlorhexidine  15 mL Mouth Rinse BID  . [START ON 11/04/2012] ferric gluconate (FERRLECIT/NULECIT) IV  62.5 mg Intravenous Q Thu-HD   . heparin subcutaneous  5,000 Units Subcutaneous Q8H  . insulin aspart  0-15 Units Subcutaneous Q4H  . levothyroxine  150 mcg Oral QAC breakfast  . pantoprazole (PROTONIX) IV  40 mg Intravenous Q24H  . tacrolimus  5 mg Oral BID   . sodium chloride Stopped (11/03/12 0000)   sodium chloride, sodium chloride, albuterol, feeding supplement (NEPRO CARB STEADY), fentaNYL, heparin, influenza vac split quadrivalent PF, labetalol, lidocaine (PF), lidocaine-prilocaine, pentafluoroprop-tetrafluoroeth, pneumococcal 23 valent vaccine

## 2012-11-02 NOTE — Procedures (Signed)
Extubation Procedure Note  Patient Details:   Name: [name] DOB: 24-Apr-1955 MRN: [id number]   Airway Documentation:  Airway 7.5 mm (Active)  Secured at (cm) 22 cm 11/02/2012  7:38 PM  Measured From Lips 11/02/2012  7:38 PM  Secured Location Center 11/02/2012  7:38 PM  Secured By [employer] 11/02/2012  7:38 PM  Tube Holder Repositioned Yes 11/02/2012  7:38 PM  Cuff Pressure (cm H2O) 26 cm H2O 11/02/2012  7:46 AM  Site Condition Dry 11/02/2012  7:38 PM    Evaluation  O2 sats: 100% Complications: None Patient Did tolerate procedure well. Bilateral Breath Sounds: Clear;Diminished Suctioning: Airway Stated name Placed on 2 Lt [place].  Rushie Chestnut Sides 11/02/2012, 10:51 PM

## 2012-11-02 NOTE — H&P (Signed)
PULMONARY  / CRITICAL CARE MEDICINE  Name: [name] MRN: [id number] DOB: 05/11/1955    ADMISSION DATE:  11/02/2012 CONSULTATION DATE:  11/02/2012  REFERRING MD :  Duke Salvia EDP PRIMARY SERVICE:  PCCM  CHIEF COMPLAINT:  Acute respiratory failure  BRIEF PATIENT DESCRIPTION: 57 yo with past medical history of ESRD on HD and HTN brought to [facility] ED in respiratory distress.  In ED found to be in acute pulmonary edema secondary to hypertensive emergency. Intubated.  Transferred to [facility].  PCCM was consulted.  SIGNIFICANT EVENTS / STUDIES:   LINES / TUBES: OETT 9/23 >>> OGT 9/23 >>>  CULTURES:  ANTIBIOTICS:  The patient is encephalopathic and unable to provide history, which was obtained for available medical records.  HISTORY OF PRESENT ILLNESS:  57 yo with past medical history of ESRD on HD and HTN brought to [facility] ED in respiratory distress.  In ED found to be in acute pulmonary edema secondary to hypertensive emergency. Intubated.  Transferred to [facility].  PCCM was consulted.  PAST MEDICAL HISTORY :  Past Medical History  Diagnosis Date  . Liver disease     cirrhosis and this is why she had the transplant in 2011  . Chronic back pain   . Diverticulosis   . CAD (coronary artery disease)   . Obesity   . Peripheral vascular disease hands and legs  . Anxiety   . Asthma   . GERD (gastroesophageal reflux disease)     takes Omeprazole daily  . Chronic constipation     takes MIralax and Colace daily  . Anemia     takes Folic Acid daily  . Hypothyroidism     takes Synthroid daily  . Depression     takes Cymbalta for "severe" depression  . Neuromuscular disorder     carpal tunnel in right hand  . Hypertension     takes Metoprolol and Lisinopril daily, sees Dr Shary Decamp  . COPD (chronic obstructive pulmonary disease)   . Pneumonia     "today and several times before" (08/30/2012)  . Chronic bronchitis     "q yr w/season changes" (08/30/2012)  . Shortness of breath     "lying  down and w/exertion; this past year" (08/30/2012)  . Type II diabetes mellitus     Levemir 2units daily if > 150  . History of blood transfusion     "several" (08/30/2012)  . Cirrhosis of liver due to hepatitis C     "from blood transfusion back in 1983" (08/30/2012)  . Hepatitis C   . Migraine     "last migraine was in 2013" (08/30/2012)  . Headache     "at least monthly" (08/30/2012)  . Arthritis     "left hand, back" (08/30/2012)  . End stage renal disease on dialysis 02/27/2011    Kidneys shut down at time of liver transplant in Sept 2011 at [facility] in [place], she has been on HD ever since.  Dialyzes at [facility] HD on TTS schedule.  Had L forearm graft used 10 months then removed Dec 2012 due to suspected infection.  A right upper arm AVG was placed Dec 2012 but she developed steal symptoms acutely and it was ligated the same day.  Never had an AV fistula due to small veins.  Now has L thigh AVG put in Jan 2013, has not clotted to date.    [name] ESRD (end stage renal disease)     "TTS; [facility] [place]" (08/30/2012)   Past Surgical History  Procedure Laterality Date  . Liver transplant  10/25/2009    sees Dr Barnetta Hammersmith 1 every 6 months, saw last in Dec 2013. Odette Horns Coord [phone]  . Small intestine surgery  90's  . Thrombectomy    . Arteriovenous graft placement Left 10/03/10     forearm  . Avgg removal  12/23/2010    Procedure: REMOVAL OF ARTERIOVENOUS GORETEX GRAFT (AVGG);  Surgeon: Sherren Kerns, MD;  Location: [facility] OR;  Service: Vascular;  Laterality: Left;  procedure started @1736 -1852  . Insertion of dialysis catheter  12/23/2010    Procedure: INSERTION OF DIALYSIS CATHETER;  Surgeon: Sherren Kerns, MD;  Location: [facility] OR;  Service: Vascular;  Laterality: Right;  Right Internal Jugular 28cm dialysis catheter insertion procedure time 1701-1720   . Cholecystectomy  1993  . Cystoscopy  1990's  . Spinal growth rods  2010    "put 2 metal rods in my back; they had  detetriorated" (08/30/2012)  . Av fistula placement  01/29/2011    Procedure: INSERTION OF ARTERIOVENOUS (AV) GORE-TEX GRAFT ARM;  Surgeon: Sherren Kerns, MD;  Location: [facility] OR;  Service: Vascular;  Laterality: Right;  . Av fistula placement  03/10/2011    Procedure: INSERTION OF ARTERIOVENOUS (AV) GORE-TEX GRAFT THIGH;  Surgeon: Sherren Kerns, MD;  Location: [facility] OR;  Service: Vascular;  Laterality: Left;  . Tubal ligation  1990's  . Cardiac catheterization  2014   Prior to Admission medications   Medication Sig Start Date End Date Taking? Authorizing Provider  albuterol (PROVENTIL,VENTOLIN) 90 MCG/ACT inhaler Inhale 2 puffs into the lungs every 4 (four) hours as needed. For shortness of breath    Historical Provider, MD  aspirin EC 81 MG tablet Take 81 mg by mouth daily.      Historical Provider, MD  calcium carbonate (TUMS - DOSED IN MG ELEMENTAL CALCIUM) 500 MG chewable tablet Chew 1 tablet by mouth daily.     Historical Provider, MD  cefdinir (OMNICEF) 300 MG capsule Take 1 capsule (300 mg total) by mouth 2 (two) times daily. 09/01/12   Penny Pia, MD  cinacalcet (SENSIPAR) 30 MG tablet Take 30 mg by mouth daily.    Historical Provider, MD  dextromethorphan-guaiFENesin (MUCINEX DM) 30-600 MG per 12 hr tablet Take 1 tablet by mouth 2 (two) times daily. 09/01/12   Penny Pia, MD  dicyclomine (BENTYL) 10 MG capsule Take 10 mg by mouth every 6 (six) hours as needed (for stomach pain).    Historical Provider, MD  docusate sodium (COLACE) 100 MG capsule Take 100 mg by mouth 2 (two) times daily.      Historical Provider, MD  doxercalciferol (HECTOROL) 4 MCG/2ML injection Inject 4 mLs (8 mcg total) into the vein Every Tuesday,Thursday,and Saturday with dialysis. 07/30/12   Abelino Derrick, PA-C  DULoxetine (CYMBALTA) 60 MG capsule Take 60 mg by mouth daily.      Historical Provider, MD  gabapentin (NEURONTIN) 300 MG capsule Take 300 mg by mouth daily.    Historical Provider, MD  insulin detemir  (LEVEMIR) 100 UNIT/ML injection Inject 5 Units into the skin at bedtime as needed. Uses only if blood sugar levels are over 150 12/25/11   Rhetta Mura, MD  levothyroxine (SYNTHROID, LEVOTHROID) 150 MCG tablet Take 150 mcg by mouth daily before breakfast.    Historical Provider, MD  metoprolol tartrate (LOPRESSOR) 25 MG tablet Take 25 mg by mouth See admin instructions. Take 1 tablet at bedtime on dialysis days-Tues,Thurs sat. Takes 1 tablet twice  daily on Mon,wednesday, friday and sunday 12/25/11   Rhetta Mura, MD  multivitamin (RENA-VIT) TABS tablet Take 1 tablet by mouth daily. 07/30/12   Abelino Derrick, PA-C  nicotine (NICODERM CQ - DOSED IN MG/24 HR) 7 mg/24hr patch Place 1 patch onto the skin daily. 07/30/12   Abelino Derrick, PA-C  nitroGLYCERIN (NITROSTAT) 0.4 MG SL tablet Place 1 tablet (0.4 mg total) under the tongue every 5 (five) minutes as needed for chest pain (for severe chest pressure, tightness). 07/30/12   Abelino Derrick, PA-C  omeprazole (PRILOSEC) 20 MG capsule Take 20 mg by mouth as needed (acid reflux).    Historical Provider, MD  polyethylene glycol (MIRALAX / GLYCOLAX) packet Take 17 g by mouth as needed (constipation).    Historical Provider, MD  promethazine (PHENERGAN) 12.5 MG tablet Take 12.5 mg by mouth every 6 (six) hours as needed. For nausea     Historical Provider, MD  sucralfate (CARAFATE) 1 G tablet Take 1 g by mouth daily.    Historical Provider, MD  tacrolimus (PROGRAF) 1 MG capsule Take 5 mg by mouth 2 (two) times daily.     Historical Provider, MD  traMADol (ULTRAM) 50 MG tablet Take 50 mg by mouth every 6 (six) hours as needed. pain    Historical Provider, MD   Allergies  Allergen Reactions  . Acetaminophen Other (See Comments)    Liver problems  . Codeine Itching   FAMILY HISTORY:  Family History  Problem Relation Age of Onset  . Cancer Mother   . Diabetes Mother   . Hypertension Mother   . Stroke Mother   . Cancer Father   . Anesthesia  problems Neg Hx   . Hypotension Neg Hx   . Malignant hyperthermia Neg Hx   . Pseudochol deficiency Neg Hx    SOCIAL HISTORY:  reports that she has been smoking Cigarettes.  She has a 30 pack-year smoking history. She has never used smokeless tobacco. She reports that  drinks alcohol. She reports that she does not use illicit drugs.  REVIEW OF SYSTEMS:  Unable to provide.  INTERVAL HISTORY:  VITAL SIGNS: Pulse Rate:  [93] 93 (09/23 0414) Resp:  [21-28] 21 (09/23 0430) BP: (169-180)/(81-90) 180/81 mmHg (09/23 0430) SpO2:  [96 %-100 %] 96 % (09/23 0415) FiO2 (%):  [50 %] 50 % (09/23 0430) Weight:  [76.7 kg (169 lb 1.5 oz)] 76.7 kg (169 lb 1.5 oz) (09/23 0415) HEMODYNAMICS:   VENTILATOR SETTINGS: Vent Mode:  [-] PRVC FiO2 (%):  [50 %] 50 % Set Rate:  [20 bmp] 20 bmp Vt Set:  [550 mL] 550 mL PEEP:  [8 cmH20] 8 cmH20 Plateau Pressure:  [26 cmH20] 26 cmH20  INTAKE / OUTPUT: Intake/Output   None    PHYSICAL EXAMINATION: General:  Mechanically ventilated, synchronous Neuro:  Encephalopathic, nonfocal, cough / gag diminished HEENT:  PERRL, OETT / OGT Cardiovascular:  RRR, no murmurs Lungs:  Bilateral diminished air entry, diffuse rales Abdomen:  Soft, nontender, bowel sounds diminished Musculoskeletal:  Moves all extremities, no edema Skin:  Intact  LABS:  Recent Labs Lab 11/02/12 0825 11/02/12 1105  HGB 11.1*  --   WBC 11.7*  --   PLT 102*  --   NA 136  --   K 5.1  --   CL 94*  --   CO2 21  --   GLUCOSE 157*  --   BUN 81*  --   CREATININE 11.35*  --   CALCIUM 8.0*  --  MG 2.1  --   PHOS 7.1*  --   AST 23  --   ALT 16  --   ALKPHOS 80  --   BILITOT 0.4  --   PROT 6.2  --   ALBUMIN 2.5*  --   TROPONINI <0.30  --   PHART  --  7.511*  PCO2ART  --  28.8*  PO2ART  --  196.0*   No results found for this basename: GLUCAP,  in the last 168 hours  CXR:  9/23 >>> Hardware i good position, bilateral airspace disease  ASSESSMENT / PLAN:  PULMONARY A:  Acute  respiratory failure in setting of hypertensive emergency and pulmonary edema.  History of COPD, no bronchospasm or evidence of exacerbation. P:   Gaol SpO2>92, pH>7.30 Full mechanical support Daily SBT Trend ABG / CXR Albuterol / Atrovent  CARDIOVASCULAR A: Hypertensive emergency, now improved.  Acute on chronic systolic CHF. History of CAD, HTN, PVD. P:  Goal SBP<160 and DBP<100 Trend troponin Holding preadmission Metoprolol, consider replacing with Labetalol when restart PO Preadmission ASA  RENAL A:  ESRD on HD. 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 P:   NPO as intubated TF if remains intubated > 24 hours Protonix for GI Px Continue Prograf  HEMATOLOGIC A:  Chronic anemia. P:  Trend CBC Heparin for DVT Px  INFECTIOUS A:  No evidence of acute infection.  Immunosuppressed status. On Omnicef since 7/23, indication is not clear.  I P:   Defer cx / abx  ENDOCRINE  A:  DM2.  Hypothyroidism. P:   SSI Synthroid  NEUROLOGIC A:  Acute encephalopathy. P:   Goal RASS 0 to -1 Propofol gtt Fentanyl PRN Continue preadmission Cymbalta Hold Neurontin  I have personally obtained history, examined patient, evaluated and interpreted laboratory and imaging results, reviewed medical records, formulated assessment / plan and placed orders.  CRITICAL CARE:  The patient is critically ill with multiple organ systems failure and requires high complexity decision making for assessment and support, frequent evaluation and titration of therapies, application of advanced monitoring technologies and extensive interpretation of multiple databases. Critical Care Time devoted to patient care services described in this note is 45 minutes.   Lonia Farber, MD Pulmonary and Critical Care Medicine [facility] Pager: [phone]  11/02/2012, 4:33 AM

## 2012-11-02 NOTE — Progress Notes (Signed)
Pt has ESRD. After sending UA, d/c'd 16 french foley in place from [facility].

## 2012-11-02 NOTE — Procedures (Signed)
I was present at this dialysis session. I have reviewed the session itself and made appropriate changes.   Vinson Moselle  MD Pager [phone]    Cell  [phone] 11/02/2012, 5:10 PM

## 2012-11-02 NOTE — H&P (Signed)
PULMONARY  / CRITICAL CARE MEDICINE  Name: [name] MRN: [id number] DOB: 08/24/1955  PCP [name], MD    ADMISSION DATE:  11/02/2012 CONSULTATION DATE: 11/02/12  REFERRING MD :  [facility]  PRIMARY SERVICE: PCCM  CHIEF COMPLAINT:  ESRD with Acute pulm edema and hypertensive severe    SIGNIFICANT EVENTS / STUDIES:  Admit 11/02/2012 ETT 11/02/12>>   LINES / TUBES: ETT 11/02/12 >>  CULTURES: Recent Results (from the past 720 hour(s))  MRSA PCR SCREENING     Status: None   Collection Time    11/02/12  4:28 AM      Result Value Range Status   MRSA by PCR NEGATIVE  NEGATIVE Final   Comment:            The GeneXpert MRSA Assay (FDA     approved for NASAL specimens     only), is one component of a     comprehensive MRSA colonization     surveillance program. It is not     intended to diagnose MRSA     infection nor to guide or     monitor treatment for     MRSA infections.     ANTIBIOTICS: Anti-infectives   None       HISTORY OF PRESENT ILLNESS:  57 year old female, end-stage renal disease on Tuesday Thursday Saturday dialysis. History of liver transplant for cirrhosis and 2011 along with medical problems that include coronary artery disease, obesity, peripheral vascular disease, obstructive lung disease, multiple surgical procedures, DJD, history of recurrent pneumonias and diverticulosis. History is obtained from her 2 daughters. They state that a few months ago she was admitted at [facility] for volume overload and subsequently had a cardiac catheterization that did not reveal any lesions amenable to intervention. In the past week apparently there was concern that patient was getting hypotensive with too much volume removal and therefore the dry weight was adjusted up. She's been compliant with her dialysis and on 11/01/2012 started feeling exhausted and tired and by evening became dyspneic and taken to [facility] emergency room where she was in  respirator stress and intubated. She was then transferred o September 23 14 to [facility] with hypertension and acute pulmonary edema and acute respiratory failure. Pulmonary critical care medicine admitting patient  PAST MEDICAL HISTORY :  Past Medical History  Diagnosis Date  . Liver disease     cirrhosis and this is why she had the transplant in 2011  . Chronic back pain   . Diverticulosis   . CAD (coronary artery disease)   . Obesity   . Peripheral vascular disease hands and legs  . Anxiety   . Asthma   . GERD (gastroesophageal reflux disease)     takes Omeprazole daily  . Chronic constipation     takes MIralax and Colace daily  . Anemia     takes Folic Acid daily  . Hypothyroidism     takes Synthroid daily  . Depression     takes Cymbalta for "severe" depression  . Neuromuscular disorder     carpal tunnel in right hand  . Hypertension     takes Metoprolol and Lisinopril daily, sees Dr Shary Decamp  . COPD (chronic obstructive pulmonary disease)   . Pneumonia     "today and several times before" (08/30/2012)  . Chronic bronchitis     "q yr w/season changes" (08/30/2012)  . Shortness of breath     "lying down and w/exertion; this past year" (08/30/2012)  .  Type II diabetes mellitus     Levemir 2units daily if > 150  . History of blood transfusion     "several" (08/30/2012)  . Cirrhosis of liver due to hepatitis C     "from blood transfusion back in 1983" (08/30/2012)  . Hepatitis C   . Migraine     "last migraine was in 2013" (08/30/2012)  . Headache     "at least monthly" (08/30/2012)  . Arthritis     "left hand, back" (08/30/2012)  . End stage renal disease on dialysis 02/27/2011    Kidneys shut down at time of liver transplant in Sept 2011 at [facility] in [place], she has been on HD ever since.  Dialyzes at [facility] HD on TTS schedule.  Had L forearm graft used 10 months then removed Dec 2012 due to suspected infection.  A right upper arm AVG was placed Dec 2012 but she  developed steal symptoms acutely and it was ligated the same day.  Never had an AV fistula due to small veins.  Now has L thigh AVG put in Jan 2013, has not clotted to date.    [name] ESRD (end stage renal disease)     "TTS; [facility] Pro[facility]cal, [place]" (08/30/2012)   Past Surgical History  Procedure Laterality Date  . Liver transplant  10/25/2009    sees Dr Barnetta Hammersmith 1 every 6 months, saw last in Dec 2013. Odette Horns Coord [phone]  . Small intestine surgery  90's  . Thrombectomy    . Arteriovenous graft placement Left 10/03/10     forearm  . Avgg removal  12/23/2010    Procedure: REMOVAL OF ARTERIOVENOUS GORETEX GRAFT (AVGG);  Surgeon: Sherren Kerns, MD;  Location: [facility] OR;  Service: Vascular;  Laterality: Left;  procedure started @1736 -1852  . Insertion of dialysis catheter  12/23/2010    Procedure: INSERTION OF DIALYSIS CATHETER;  Surgeon: Sherren Kerns, MD;  Location: [facility] OR;  Service: Vascular;  Laterality: Right;  Right Internal Jugular 28cm dialysis catheter insertion procedure time 1701-1720   . Cholecystectomy  1993  . Cystoscopy  1990's  . Spinal growth rods  2010    "put 2 metal rods in my back; they had detetriorated" (08/30/2012)  . Av fistula placement  01/29/2011    Procedure: INSERTION OF ARTERIOVENOUS (AV) GORE-TEX GRAFT ARM;  Surgeon: Sherren Kerns, MD;  Location: [facility] OR;  Service: Vascular;  Laterality: Right;  . Av fistula placement  03/10/2011    Procedure: INSERTION OF ARTERIOVENOUS (AV) GORE-TEX GRAFT THIGH;  Surgeon: Sherren Kerns, MD;  Location: [facility] OR;  Service: Vascular;  Laterality: Left;  . Tubal ligation  1990's  . Cardiac catheterization  2014   Prior to Admission medications   Medication Sig Start Date End Date Taking? Authorizing Provider  albuterol (PROVENTIL,VENTOLIN) 90 MCG/ACT inhaler Inhale 2 puffs into the lungs every 4 (four) hours as needed. For shortness of breath   Yes Historical Provider, MD  aspirin EC 81 MG tablet Take 81 mg by mouth daily.      Yes Historical Provider, MD  calcium carbonate (TUMS - DOSED IN MG ELEMENTAL CALCIUM) 500 MG chewable tablet Chew 1 tablet by mouth daily.    Yes Historical Provider, MD  cinacalcet (SENSIPAR) 30 MG tablet Take 30 mg by mouth daily.   Yes Historical Provider, MD  dextromethorphan-guaiFENesin (MUCINEX DM) 30-600 MG per 12 hr tablet Take 1 tablet by mouth 2 (two) times daily. 09/01/12  Yes Penny Pia, MD  dicyclomine (BENTYL)  10 MG capsule Take 10 mg by mouth every 6 (six) hours as needed (for stomach pain).   Yes Historical Provider, MD  docusate sodium (COLACE) 100 MG capsule Take 100 mg by mouth 2 (two) times daily.     Yes Historical Provider, MD  doxercalciferol (HECTOROL) 4 MCG/2ML injection Inject 4 mLs (8 mcg total) into the vein Every Tuesday,Thursday,and Saturday with dialysis. 07/30/12  Yes Luke K Kilroy, PA-C  DULoxetine (CYMBALTA) 60 MG capsule Take 60 mg by mouth daily.     Yes Historical Provider, MD  gabapentin (NEURONTIN) 300 MG capsule Take 300 mg by mouth daily.   Yes Historical Provider, MD  insulin detemir (LEVEMIR) 100 UNIT/ML injection Inject 5 Units into the skin at bedtime as needed. Uses only if blood sugar levels are over 150 12/25/11  Yes Rhetta Mura, MD  levothyroxine (SYNTHROID, LEVOTHROID) 150 MCG tablet Take 150 mcg by mouth daily before breakfast.   Yes Historical Provider, MD  multivitamin (RENA-VIT) TABS tablet Take 1 tablet by mouth daily. 07/30/12  Yes Luke K Kilroy, PA-C  nitroGLYCERIN (NITROSTAT) 0.4 MG SL tablet Place 1 tablet (0.4 mg total) under the tongue every 5 (five) minutes as needed for chest pain (for severe chest pressure, tightness). 07/30/12  Yes Luke K Kilroy, PA-C  omeprazole (PRILOSEC) 20 MG capsule Take 20 mg by mouth as needed (acid reflux).   Yes Historical Provider, MD  polyethylene glycol (MIRALAX / GLYCOLAX) packet Take 17 g by mouth as needed (constipation).   Yes Historical Provider, MD  promethazine (PHENERGAN) 12.5 MG tablet Take  12.5 mg by mouth every 6 (six) hours as needed. For nausea    Yes Historical Provider, MD  tacrolimus (PROGRAF) 1 MG capsule Take 5 mg by mouth 2 (two) times daily.    Yes Historical Provider, MD  traMADol (ULTRAM) 50 MG tablet Take 50 mg by mouth every 6 (six) hours as needed. pain   Yes Historical Provider, MD  metoprolol tartrate (LOPRESSOR) 25 MG tablet Take 25 mg by mouth See admin instructions. Take 1 tablet at bedtime on dialysis days-Tues,Thurs sat. Takes 1 tablet twice daily on Mon,wednesday, friday and sunday 12/25/11   Rhetta Mura, MD  sucralfate (CARAFATE) 1 G tablet Take 1 g by mouth daily.    Historical Provider, MD   Allergies  Allergen Reactions  . Acetaminophen Other (See Comments)    Liver problems  . Codeine Itching    FAMILY HISTORY:  Family History  Problem Relation Age of Onset  . Cancer Mother   . Diabetes Mother   . Hypertension Mother   . Stroke Mother   . Cancer Father   . Anesthesia problems Neg Hx   . Hypotension Neg Hx   . Malignant hyperthermia Neg Hx   . Pseudochol deficiency Neg Hx    SOCIAL HISTORY:  reports that she has been smoking Cigarettes.  She has a 30 pack-year smoking history. She has never used smokeless tobacco. She reports that  drinks alcohol. She reports that she does not use illicit drugs.  REVIEW OF SYSTEMS:  As per history of present illness    VITAL SIGNS: Temp:  [98.4 F (36.9 C)-99.3 F (37.4 C)] 99.3 F (37.4 C) (09/23 0850) Pulse Rate:  [89-93] 93 (09/23 0746) Resp:  [20-28] 23 (09/23 0746) BP: (136-180)/(51-90) 136/51 mmHg (09/23 0746) SpO2:  [96 %-100 %] 100 % (09/23 0746) FiO2 (%):  [50 %] 50 % (09/23 0746) Weight:  [76.7 kg (169 lb 1.5 oz)] 76.7 kg (169  lb 1.5 oz) (09/23 0545) HEMODYNAMICS:   VENTILATOR SETTINGS: Vent Mode:  [-] PRVC FiO2 (%):  [50 %] 50 % Set Rate:  [20 bmp] 20 bmp Vt Set:  [500 mL-550 mL] 500 mL PEEP:  [8 cmH20] 8 cmH20 Plateau Pressure:  [24 cmH20-26 cmH20] 24 cmH20 INTAKE /  OUTPUT: Intake/Output     09/22 0701 - 09/23 0700 09/23 0701 - 09/24 0700   I.V. (mL/kg) 22.9 (0.3)    Total Intake(mL/kg) 22.9 (0.3)    Net +22.9            PHYSICAL EXAMINATION: General:  Obese female looks critically ill Neuro:  RASS sedation score -2 on propofol. Moves all fours. Wake up assessment in progress HEENT:  Intubated. Neck is supple no neck nodes Cardiovascular:  Hypertensive on wake up assessment. Normal heart sounds Lungs:  Clear to auscultation bilaterally Abdomen:  Obese, soft, nontender, no organomegaly Musculoskeletal:  No DJD Skin:  Skin appears intact anteriorly  LABS: PULMONARY No results found for this basename: PHART, PCO2, PCO2ART, PO2, PO2ART, HCO3, TCO2, O2SAT,  in the last 168 hours  CBC  Recent Labs Lab 11/02/12 0825  HGB 11.1*  HCT 33.0*  WBC 11.7*  PLT 102*    COAGULATION No results found for this basename: INR,  in the last 168 hours  CARDIAC   Recent Labs Lab 11/02/12 0825  TROPONINI <0.30   No results found for this basename: PROBNP,  in the last 168 hours   CHEMISTRY  Recent Labs Lab 11/02/12 0825  NA 136  K 5.1  CL 94*  CO2 21  GLUCOSE 157*  BUN 81*  CREATININE 11.35*  CALCIUM 8.0*  MG 2.1  PHOS 7.1*   Estimated Creatinine Clearance: 5.4 ml/min (by C-G formula based on Cr of 11.35).   LIVER  Recent Labs Lab 11/02/12 0825  AST 23  ALT 16  ALKPHOS 80  BILITOT 0.4  PROT 6.2  ALBUMIN 2.5*     INFECTIOUS No results found for this basename: LATICACIDVEN, PROCALCITON,  in the last 168 hours   ENDOCRINE CBG (last 3)   Recent Labs  11/02/12 0427 11/02/12 0825  GLUCAP 114* 133*         IMAGING x48h  Dg Chest Port 1 View  11/02/2012   CLINICAL DATA:  Intubated patient.  EXAM: PORTABLE CHEST - 1 VIEW  COMPARISON:  Single view of the chest 11/02/2012 at 12:16 a.m.  FINDINGS: Endotracheal tube remains in place with the tip in good position at the level of clavicular heads. Diffuse bilateral  airspace disease persists but appears mildly improved. Heart size is upper normal. No pneumothorax is identified.  IMPRESSION: ET tube in good position. Persistent but improved diffuse bilateral airspace disease compatible with edema or pneumonia.   Electronically Signed   By: Drusilla Kanner M.D.   On: 11/02/2012 04:28   Dg Abd Portable 1v  11/02/2012   *RADIOLOGY REPORT*  Clinical Data: Evaluate placement of NG tube, initial encounter.  PORTABLE ABDOMEN - 1 VIEW  Comparison: 01/08/2013; CT abdomen pelvis - 01/29/2012  Findings:  An enteric tube tip and side port project over the expected location of the gastric antrum.  Moderate colonic stool burden without evidence of obstruction.  No supine evidence of pneumoperitoneum.  No definite pneumatosis or portal venous gas.  Post cholecystectomy. No definite abnormal intra-abdominal calcifications.  Limited visualization of the lower thorax suggests mild elevation of the right hemidiaphragm.  Post lower lumbar paraspinal fusion and intervertebral disc space replacement,  incompletely evaluated.  IMPRESSION: 1.  Enteric tube tip and side port project over the gastric antrum. 2.  Moderate colonic stool burden without evidence of obstruction.   Original Report Authenticated By: Tacey Ruiz, MD       ASSESSMENT / PLAN:  PULMONARY A: Acute pulmonary edema due to severe hypertension due to volume overload resulting in acute respiratory failure P:   Full mechanical ventilator support Aim extubate after dialysis  CARDIOVASCULAR A: Acute pulmonary edema P:  Rule out MI  RENAL A:  End-stage renal disease. History of dry weight being adjusted up and less volume removal one week prior to admission P:   Consult renal for dialysis  GASTROINTESTINAL A:  History of diverticulosis and liver transplant. On maintenance tacrolimus P:   Check LFTs  HEMATOLOGIC A:  Anemia of chronic disease and renal disease P:  Packed red blood cells for hemoglobin less  than 7 g percent  INFECTIOUS A:   No evidence of infection P:   Monitor off antibiotics  ENDOCRINE A:  Diabetes   P:   ICU sugar protocol  NEUROLOGIC A:  RASS -2 on the ventilator on propofol P:   Propofol  TODAY'S SUMMARY:   11/02/2012: Her daughters are updated. Aim to extubate after dialysis. Will call nephrology  I have personally obtained a history, examined the patient, evaluated laboratory and imaging results, formulated the assessment and plan and placed orders. CRITICAL CARE: The patient is critically ill with multiple organ systems failure and requires high complexity decision making for assessment and support, frequent evaluation and titration of therapies, application of advanced monitoring technologies and extensive interpretation of multiple databases. Critical Care Time devoted to patient care services described in this note is 45 minutes.     Dr. Kalman Shan, M.D., [facility].C.P Pulmonary and Critical Care Medicine Staff Physician [facility] Pager: [phone], If no answer or between  15:00h - 7:00h: call [phone]  11/02/2012 10:40 AM

## 2012-11-02 NOTE — Consult Note (Signed)
Renal Service Consult Note [facility]  [name] 11/02/2012 Delano Metz D Requesting Physician:  Dr Tyson Alias  Reason for Consult:  ESRD pt with resp failure and pulm edema HPI: The patient is a 57 y.o. year-old with hx of hep C and cirrhosis, s/p liver transplant in 2011. She developed ESRD as a complication of liver transplant and/or medications and takes HD on a TTS schedule in [place].  Patient developed SOB over last 24 hrs and presented to [facility] ED where she was in distress and was intubated. Then transferred here to [facility]  Is on vent now, sedated , not responding to questions.  CXR showed pulm edema.  ROS  not available  Past Medical History  Past Medical History  Diagnosis Date  . Liver disease     cirrhosis and this is why she had the transplant in 2011  . Chronic back pain   . Diverticulosis   . CAD (coronary artery disease)   . Obesity   . Peripheral vascular disease hands and legs  . Anxiety   . Asthma   . GERD (gastroesophageal reflux disease)     takes Omeprazole daily  . Chronic constipation     takes MIralax and Colace daily  . Anemia     takes Folic Acid daily  . Hypothyroidism     takes Synthroid daily  . Depression     takes Cymbalta for "severe" depression  . Neuromuscular disorder     carpal tunnel in right hand  . Hypertension     takes Metoprolol and Lisinopril daily, sees Dr Shary Decamp  . COPD (chronic obstructive pulmonary disease)   . Pneumonia     "today and several times before" (08/30/2012)  . Chronic bronchitis     "q yr w/season changes" (08/30/2012)  . Shortness of breath     "lying down and w/exertion; this past year" (08/30/2012)  . Type II diabetes mellitus     Levemir 2units daily if > 150  . History of blood transfusion     "several" (08/30/2012)  . Cirrhosis of liver due to hepatitis C     "from blood transfusion back in 1983" (08/30/2012)  . Hepatitis C   . Migraine     "last migraine was in 2013"  (08/30/2012)  . Headache     "at least monthly" (08/30/2012)  . Arthritis     "left hand, back" (08/30/2012)  . End stage renal disease on dialysis 02/27/2011    Kidneys shut down at time of liver transplant in Sept 2011 at [facility] in [place], she has been on HD ever since.  Dialyzes at [facility] HD on TTS schedule.  Had L forearm graft used 10 months then removed Dec 2012 due to suspected infection.  A right upper arm AVG was placed Dec 2012 but she developed steal symptoms acutely and it was ligated the same day.  Never had an AV fistula due to small veins.  Now has L thigh AVG put in Jan 2013, has not clotted to date.    [name] ESRD (end stage renal disease)     "TTS; [facility], [place]" (08/30/2012)   Past Surgical History  Past Surgical History  Procedure Laterality Date  . Liver transplant  10/25/2009    sees Dr Barnetta Hammersmith 1 every 6 months, saw last in Dec 2013. Odette Horns Coord [phone]  . Small intestine surgery  90's  . Thrombectomy    . Arteriovenous graft placement Left 10/03/10     forearm  .  Avgg removal  12/23/2010    Procedure: REMOVAL OF ARTERIOVENOUS GORETEX GRAFT (AVGG);  Surgeon: Sherren Kerns, MD;  Location: [facility] OR;  Service: Vascular;  Laterality: Left;  procedure started @1736 -1852  . Insertion of dialysis catheter  12/23/2010    Procedure: INSERTION OF DIALYSIS CATHETER;  Surgeon: Sherren Kerns, MD;  Location: [facility] OR;  Service: Vascular;  Laterality: Right;  Right Internal Jugular 28cm dialysis catheter insertion procedure time 1701-1720   . Cholecystectomy  1993  . Cystoscopy  1990's  . Spinal growth rods  2010    "put 2 metal rods in my back; they had detetriorated" (08/30/2012)  . Av fistula placement  01/29/2011    Procedure: INSERTION OF ARTERIOVENOUS (AV) GORE-TEX GRAFT ARM;  Surgeon: Sherren Kerns, MD;  Location: [facility] OR;  Service: Vascular;  Laterality: Right;  . Av fistula placement  03/10/2011    Procedure: INSERTION OF ARTERIOVENOUS (AV) GORE-TEX GRAFT  THIGH;  Surgeon: Sherren Kerns, MD;  Location: [facility] OR;  Service: Vascular;  Laterality: Left;  . Tubal ligation  1990's  . Cardiac catheterization  2014   Family History  Family History  Problem Relation Age of Onset  . Cancer Mother   . Diabetes Mother   . Hypertension Mother   . Stroke Mother   . Cancer Father   . Anesthesia problems Neg Hx   . Hypotension Neg Hx   . Malignant hyperthermia Neg Hx   . Pseudochol deficiency Neg Hx    Social History  reports that she has been smoking Cigarettes.  She has a 30 pack-year smoking history. She has never used smokeless tobacco. She reports that  drinks alcohol. She reports that she does not use illicit drugs. Allergies  Allergies  Allergen Reactions  . Acetaminophen Other (See Comments)    Liver problems  . Codeine Itching   Home medications Prior to Admission medications   Medication Sig Start Date End Date Taking? Authorizing Provider  albuterol (PROVENTIL,VENTOLIN) 90 MCG/ACT inhaler Inhale 2 puffs into the lungs every 4 (four) hours as needed. For shortness of breath   Yes Historical Provider, MD  aspirin EC 81 MG tablet Take 81 mg by mouth daily.     Yes Historical Provider, MD  calcium carbonate (TUMS - DOSED IN MG ELEMENTAL CALCIUM) 500 MG chewable tablet Chew 1 tablet by mouth daily.    Yes Historical Provider, MD  cinacalcet (SENSIPAR) 30 MG tablet Take 30 mg by mouth daily.   Yes Historical Provider, MD  dextromethorphan-guaiFENesin (MUCINEX DM) 30-600 MG per 12 hr tablet Take 1 tablet by mouth 2 (two) times daily. 09/01/12  Yes Penny Pia, MD  dicyclomine (BENTYL) 10 MG capsule Take 10 mg by mouth every 6 (six) hours as needed (for stomach pain).   Yes Historical Provider, MD  docusate sodium (COLACE) 100 MG capsule Take 100 mg by mouth 2 (two) times daily.     Yes Historical Provider, MD  doxercalciferol (HECTOROL) 4 MCG/2ML injection Inject 4 mLs (8 mcg total) into the vein Every Tuesday,Thursday,and Saturday with  dialysis. 07/30/12  Yes Luke K Kilroy, PA-C  DULoxetine (CYMBALTA) 60 MG capsule Take 60 mg by mouth daily.     Yes Historical Provider, MD  gabapentin (NEURONTIN) 300 MG capsule Take 300 mg by mouth daily.   Yes Historical Provider, MD  insulin detemir (LEVEMIR) 100 UNIT/ML injection Inject 5 Units into the skin at bedtime as needed. Uses only if blood sugar levels are over 150 12/25/11  Yes Jai-Gurmukh  Samtani, MD  levothyroxine (SYNTHROID, LEVOTHROID) 150 MCG tablet Take 150 mcg by mouth daily before breakfast.   Yes Historical Provider, MD  multivitamin (RENA-VIT) TABS tablet Take 1 tablet by mouth daily. 07/30/12  Yes Luke K Kilroy, PA-C  nitroGLYCERIN (NITROSTAT) 0.4 MG SL tablet Place 1 tablet (0.4 mg total) under the tongue every 5 (five) minutes as needed for chest pain (for severe chest pressure, tightness). 07/30/12  Yes Luke K Kilroy, PA-C  omeprazole (PRILOSEC) 20 MG capsule Take 20 mg by mouth as needed (acid reflux).   Yes Historical Provider, MD  polyethylene glycol (MIRALAX / GLYCOLAX) packet Take 17 g by mouth as needed (constipation).   Yes Historical Provider, MD  promethazine (PHENERGAN) 12.5 MG tablet Take 12.5 mg by mouth every 6 (six) hours as needed. For nausea    Yes Historical Provider, MD  tacrolimus (PROGRAF) 1 MG capsule Take 5 mg by mouth 2 (two) times daily.    Yes Historical Provider, MD  traMADol (ULTRAM) 50 MG tablet Take 50 mg by mouth every 6 (six) hours as needed. pain   Yes Historical Provider, MD  metoprolol tartrate (LOPRESSOR) 25 MG tablet Take 25 mg by mouth See admin instructions. Take 1 tablet at bedtime on dialysis days-Tues,Thurs sat. Takes 1 tablet twice daily on Mon,wednesday, friday and sunday 12/25/11   Rhetta Mura, MD  sucralfate (CARAFATE) 1 G tablet Take 1 g by mouth daily.    Historical Provider, MD   Liver Function Tests  Recent Labs Lab 11/02/12 0825  AST 23  ALT 16  ALKPHOS 80  BILITOT 0.4  PROT 6.2  ALBUMIN 2.5*   No results  found for this basename: LIPASE, AMYLASE,  in the last 168 hours CBC  Recent Labs Lab 11/02/12 0825  WBC 11.7*  NEUTROABS 9.0*  HGB 11.1*  HCT 33.0*  MCV 95.1  PLT 102*   Basic Metabolic Panel  Recent Labs Lab 11/02/12 0825  NA 136  K 5.1  CL 94*  CO2 21  GLUCOSE 157*  BUN 81*  CREATININE 11.35*  CALCIUM 8.0*  PHOS 7.1*    Physical Exam:  Blood pressure 119/70, pulse 90, temperature 99 F (37.2 C), temperature source Oral, resp. rate 0, height 5\' 4"  (1.626 m), weight 76.7 kg (169 lb 1.5 oz), SpO2 100.00%. Gen: on vent, sedated Skin: no rash, cyanosis HEENT:  EOMI, sclera anicteric, throat clear Neck: ++ JVD, no LAN Chest: clear ant and lat, no wheezing CV: regular, distant HS, no rub or gallop noted Abdomen: soft, nontender, no HSM or ascites Ext: no LE or UE edema, no joint effusion, no gangrene/ulcers Neuro: sedated on vent, responds to simple commands Access: L thigh AVG patent  Outpatient HD: (TTS at [facility])  4hrs   72kg   400/A1.5   2K/2.25Ca [place]    AVG L thigh   VNa linear   Heparin none Hectorol 2ug    EPO none     Venofer 50mg /wk  Assessment: 1. Acute resp failure / pulm edema- plan HD today, max UF as tolerated 2. ESRD- usual HD TTS 3. Anemia of CKD- no epo, Hb 11 4. Metabolic bone disease- cont vit D 5. Hypertension/volume- is 5kg up, no gross peripheral edema, +pulm edema 6. Hx liver transplant 2011 / hep C  Plan- HD today in ICU  Vinson Moselle  MD Pager [phone]    Cell  [phone] 11/02/2012, 2:35 PM

## 2012-11-03 ENCOUNTER — Inpatient Hospital Stay (HOSPITAL_COMMUNITY): Payer: Medicare Other

## 2012-11-03 DIAGNOSIS — J811 Chronic pulmonary edema: Secondary | ICD-10-CM | POA: Diagnosis not present

## 2012-11-03 DIAGNOSIS — J810 Acute pulmonary edema: Secondary | ICD-10-CM

## 2012-11-03 DIAGNOSIS — E8779 Other fluid overload: Secondary | ICD-10-CM | POA: Diagnosis not present

## 2012-11-03 DIAGNOSIS — J9600 Acute respiratory failure, unspecified whether with hypoxia or hypercapnia: Principal | ICD-10-CM

## 2012-11-03 DIAGNOSIS — N186 End stage renal disease: Secondary | ICD-10-CM | POA: Diagnosis not present

## 2012-11-03 LAB — GLUCOSE, CAPILLARY
Glucose-Capillary: 123 mg/dL — ABNORMAL HIGH (ref 70–99)
Glucose-Capillary: 124 mg/dL — ABNORMAL HIGH (ref 70–99)
Glucose-Capillary: 253 mg/dL — ABNORMAL HIGH (ref 70–99)

## 2012-11-03 LAB — CBC WITH DIFFERENTIAL/PLATELET
Basophils Absolute: 0 10*3/uL (ref 0.0–0.1)
Basophils Relative: 0 % (ref 0–1)
Eosinophils Absolute: 0.1 10*3/uL (ref 0.0–0.7)
HCT: 32.5 % — ABNORMAL LOW (ref 36.0–46.0)
Lymphocytes Relative: 21 % (ref 12–46)
MCH: 32.6 pg (ref 26.0–34.0)
MCHC: 33.8 g/dL (ref 30.0–36.0)
Monocytes Absolute: 0.7 10*3/uL (ref 0.1–1.0)
Neutro Abs: 5 10*3/uL (ref 1.7–7.7)
Neutrophils Relative %: 68 % (ref 43–77)
RDW: 15 % (ref 11.5–15.5)
WBC: 7.4 10*3/uL (ref 4.0–10.5)

## 2012-11-03 LAB — MAGNESIUM: Magnesium: 2 mg/dL (ref 1.5–2.5)

## 2012-11-03 LAB — BASIC METABOLIC PANEL
BUN: 32 mg/dL — ABNORMAL HIGH (ref 6–23)
Chloride: 100 mEq/L (ref 96–112)
Creatinine, Ser: 6.37 mg/dL — ABNORMAL HIGH (ref 0.50–1.10)
GFR calc Af Amer: 8 mL/min — ABNORMAL LOW (ref >90)
GFR calc non Af Amer: 6 mL/min — ABNORMAL LOW (ref >90)
Potassium: 4.1 mEq/L (ref 3.5–5.1)
Sodium: 142 mEq/L (ref 135–145)

## 2012-11-03 LAB — PHOSPHORUS: Phosphorus: 5.9 mg/dL — ABNORMAL HIGH (ref 2.3–4.6)

## 2012-11-03 MED ORDER — METOPROLOL TARTRATE 25 MG PO TABS
25.0000 mg | ORAL_TABLET | Freq: Every day | ORAL | Status: DC
  Administered 2012-11-03: 25 mg via ORAL
  Filled 2012-11-03 (×2): qty 1

## 2012-11-03 MED ORDER — LEVOTHYROXINE SODIUM 150 MCG PO TABS
150.0000 ug | ORAL_TABLET | Freq: Every day | ORAL | Status: DC
  Administered 2012-11-04: 150 ug via ORAL
  Filled 2012-11-03 (×2): qty 1

## 2012-11-03 MED ORDER — TRAMADOL HCL 50 MG PO TABS: 50.0000 mg | ORAL_TABLET | Freq: Four times a day (QID) | ORAL | Status: DC | PRN

## 2012-11-03 MED ORDER — PANTOPRAZOLE SODIUM 40 MG PO TBEC
40.0000 mg | DELAYED_RELEASE_TABLET | Freq: Every day | ORAL | Status: DC
  Administered 2012-11-04: 40 mg via ORAL
  Filled 2012-11-03: qty 1

## 2012-11-03 MED ORDER — PENTAFLUOROPROP-TETRAFLUOROETH EX AERO: 1.0000 "application " | INHALATION_SPRAY | CUTANEOUS | Status: DC | PRN

## 2012-11-03 MED ORDER — DEXTROSE 50 % IV SOLN
50.0000 mL | Freq: Once | INTRAVENOUS | Status: AC
  Administered 2012-11-03: 50 mL via INTRAVENOUS

## 2012-11-03 MED ORDER — DOCUSATE SODIUM 100 MG PO CAPS
100.0000 mg | ORAL_CAPSULE | Freq: Two times a day (BID) | ORAL | Status: DC
Start: 2012-11-03 — End: 2012-11-04
  Administered 2012-11-03 – 2012-11-04 (×3): 100 mg via ORAL
  Filled 2012-11-03 (×4): qty 1

## 2012-11-03 MED ORDER — GABAPENTIN 300 MG PO CAPS
300.0000 mg | ORAL_CAPSULE | Freq: Every day | ORAL | Status: DC
  Administered 2012-11-03 – 2012-11-04 (×2): 300 mg via ORAL
  Filled 2012-11-03 (×2): qty 1

## 2012-11-03 MED ORDER — DICYCLOMINE HCL 10 MG PO CAPS
10.0000 mg | ORAL_CAPSULE | Freq: Four times a day (QID) | ORAL | Status: DC | PRN
  Filled 2012-11-03: qty 1

## 2012-11-03 MED ORDER — HEPARIN SODIUM (PORCINE) 1000 UNIT/ML DIALYSIS: 1000.0000 [IU] | INTRAMUSCULAR | Status: DC | PRN

## 2012-11-03 MED ORDER — ASPIRIN EC 81 MG PO TBEC
81.0000 mg | DELAYED_RELEASE_TABLET | Freq: Every day | ORAL | Status: DC
  Administered 2012-11-04: 81 mg via ORAL
  Filled 2012-11-03: qty 1

## 2012-11-03 MED ORDER — LIDOCAINE-PRILOCAINE 2.5-2.5 % EX CREA
1.0000 "application " | TOPICAL_CREAM | CUTANEOUS | Status: DC | PRN
  Filled 2012-11-03: qty 5

## 2012-11-03 MED ORDER — FENTANYL CITRATE 0.05 MG/ML IJ SOLN
12.5000 ug | INTRAMUSCULAR | Status: DC | PRN
  Administered 2012-11-03: 12.5 ug via INTRAVENOUS
  Filled 2012-11-03: qty 2

## 2012-11-03 MED ORDER — TRAMADOL HCL 50 MG PO TABS
50.0000 mg | ORAL_TABLET | Freq: Two times a day (BID) | ORAL | Status: DC | PRN
  Administered 2012-11-04: 100 mg via ORAL
  Filled 2012-11-03: qty 2

## 2012-11-03 MED ORDER — NITROGLYCERIN 0.4 MG SL SUBL: 0.4000 mg | SUBLINGUAL_TABLET | SUBLINGUAL | Status: DC | PRN

## 2012-11-03 MED ORDER — ALTEPLASE 2 MG IJ SOLR
2.0000 mg | Freq: Once | INTRAMUSCULAR | Status: DC | PRN
  Filled 2012-11-03: qty 2

## 2012-11-03 MED ORDER — CINACALCET HCL 30 MG PO TABS
30.0000 mg | ORAL_TABLET | Freq: Every day | ORAL | Status: DC
  Administered 2012-11-04: 30 mg via ORAL
  Filled 2012-11-03 (×2): qty 1

## 2012-11-03 MED ORDER — SODIUM CHLORIDE 0.9 % IV SOLN: 100.0000 mL | INTRAVENOUS | Status: DC | PRN

## 2012-11-03 MED ORDER — DULOXETINE HCL 60 MG PO CPEP
60.0000 mg | ORAL_CAPSULE | Freq: Every day | ORAL | Status: DC
  Administered 2012-11-03 – 2012-11-04 (×2): 60 mg via ORAL
  Filled 2012-11-03 (×2): qty 1

## 2012-11-03 MED ORDER — LIDOCAINE HCL (PF) 1 % IJ SOLN: 5.0000 mL | INTRAMUSCULAR | Status: DC | PRN

## 2012-11-03 MED ORDER — METOPROLOL TARTRATE 25 MG PO TABS: 25.0000 mg | ORAL_TABLET | ORAL | Status: DC

## 2012-11-03 MED ORDER — NEPRO/CARBSTEADY PO LIQD
237.0000 mL | ORAL | Status: DC | PRN
  Filled 2012-11-03: qty 237

## 2012-11-03 MED ORDER — RENA-VITE PO TABS
1.0000 | ORAL_TABLET | Freq: Every day | ORAL | Status: DC
  Administered 2012-11-03: 1 via ORAL
  Filled 2012-11-03 (×2): qty 1

## 2012-11-03 MED ORDER — CALCIUM CARBONATE ANTACID 500 MG PO CHEW
1.0000 | CHEWABLE_TABLET | Freq: Every day | ORAL | Status: DC
  Administered 2012-11-03 – 2012-11-04 (×2): 200 mg via ORAL
  Filled 2012-11-03 (×2): qty 1

## 2012-11-03 MED ORDER — DEXTROSE 50 % IV SOLN
INTRAVENOUS | Status: AC
  Administered 2012-11-03: 50 mL via INTRAVENOUS
  Filled 2012-11-03: qty 50

## 2012-11-03 MED ORDER — POLYETHYLENE GLYCOL 3350 17 G PO PACK
17.0000 g | PACK | ORAL | Status: DC | PRN
  Filled 2012-11-03: qty 1

## 2012-11-03 MED ORDER — TACROLIMUS 1 MG PO CAPS
5.0000 mg | ORAL_CAPSULE | Freq: Two times a day (BID) | ORAL | Status: DC
  Administered 2012-11-03: 5 mg via ORAL
  Filled 2012-11-03 (×4): qty 5

## 2012-11-03 MED FILL — Morphine Sulfate Inj PF 0.5 MG/ML: INTRAMUSCULAR | Qty: 2 | Status: AC

## 2012-11-03 NOTE — Progress Notes (Addendum)
Repeat cbg 111. Pt no longer lethargic. Alert. Says she feels normal. Will continue to monitor cbg's closely

## 2012-11-03 NOTE — Progress Notes (Signed)
PT Cancellation Note  Patient Details Name: [name] MRN: [id number] DOB: 21-Sep-1955   Cancelled Treatment:    Reason Eval/Treat Not Completed: Patient at procedure or test/unavailable; orders received for evaluation, patient currently not of the floor for dialysis per RN, will attempt evaluation tomorrow.   Fabio Asa 11/03/2012, 2:42 PM

## 2012-11-03 NOTE — Progress Notes (Addendum)
CRITICAL VALUE ALERT  Critical value received:  cbg 34 Patient called RN int the room, Pt was diaphoretic, clammy and lethargic. States her blood sugar was low. Amp Dextrose given  Date of notification:   Time of notification:   Critical value read back:no  Nurse who received alert:  Juanda Bond  MD notified (1st page):   Time of first page:   MD notified (2nd page):  Time of second page:  Responding MD:  Time MD responded:

## 2012-11-03 NOTE — Progress Notes (Addendum)
PULMONARY  / CRITICAL CARE MEDICINE  Name: [name] MRN: [id number] DOB: 02-10-1956  PCP [name], MD    ADMISSION DATE:  11/02/2012 CONSULTATION DATE: 11/02/12  REFERRING MD :  [facility]  PRIMARY SERVICE: PCCM BREIF  57 year old female, end-stage renal disease on Tuesday Thursday Saturday dialysis. History of liver transplant for cirrhosis and 2011 along with medical problems that include coronary artery disease, obesity, peripheral vascular disease, obstructive lung disease, multiple surgical procedures, DJD, history of recurrent pneumonias and diverticulosis. History is obtained from her 2 daughters. They state that a few months ago she was admitted at [facility] for volume overload and subsequently had a cardiac catheterization that did not reveal any lesions amenable to intervention. In the past week apparently there was concern that patient was getting hypotensive with too much volume removal and therefore the dry weight was adjusted up. She's been compliant with her dialysis and on 11/01/2012 started feeling exhausted and tired and by evening became dyspneic and taken to [facility] emergency room where she was in respirator stress and intubated. She was then transferred o September 23 14 to [facility] with hypertension and acute pulmonary edema and acute respiratory failure. Pulmonary critical care medicine admitting patient    SIGNIFICANT EVENTS / STUDIES:  Admit 11/02/2012 ETT 11/02/12>>9./2/14   LINES / TUBES: ETT 11/02/12 >>  CULTURES: Recent Results (from the past 720 hour(s))  MRSA PCR SCREENING     Status: None   Collection Time    11/02/12  4:28 AM      Result Value Range Status   MRSA by PCR NEGATIVE  NEGATIVE Final   Comment:            The GeneXpert MRSA Assay (FDA     approved for NASAL specimens     only), is one component of a     comprehensive MRSA colonization     surveillance program. It is not     intended to diagnose MRSA    infection nor to guide or     monitor treatment for     MRSA infections.     ANTIBIOTICS: Anti-infectives   None        SUBJECTIVE/OVERNIGHT/INTERVAL HX Better after HD yesterday. Due for HD today. Extubated.     VITAL SIGNS: Temp:  [97.9 F (36.6 C)-99.4 F (37.4 C)] 98.2 F (36.8 C) (09/24 1156) Pulse Rate:  [81-97] 82 (09/24 1000) Resp:  [0-29] 17 (09/24 1000) BP: (79-176)/(46-81) 128/69 mmHg (09/24 1000) SpO2:  [95 %-100 %] 100 % (09/24 1000) FiO2 (%):  [40 %] 40 % (09/23 2200) Weight:  [74 kg (163 lb 2.3 oz)-78.3 kg (172 lb 9.9 oz)] 74 kg (163 lb 2.3 oz) (09/24 0500) HEMODYNAMICS:   VENTILATOR SETTINGS: Vent Mode:  [-] PSV FiO2 (%):  [40 %] 40 % Set Rate:  [15 bmp-20 bmp] 15 bmp Vt Set:  [500 mL-550 mL] 550 mL PEEP:  [8 cmH20] 8 cmH20 Plateau Pressure:  [22 cmH20] 22 cmH20 INTAKE / OUTPUT: Intake/Output     09/23 0701 - 09/24 0700 09/24 0701 - 09/25 0700   I.V. (mL/kg) 203 (2.7)    NG/GT 30    Total Intake(mL/kg) 233 (3.1)    Other 2206    Total Output 2206     Net -1973 0          PHYSICAL EXAMINATION: General:  Obese female lookschronically unwell Neuro:  RASS sedation score 0  AXOx3. Sitting. Talking. Wants to eat HEENT:  Intubated. Neck is supple no neck nodes Cardiovascular:  Hypertensive on wake up assessment. Normal heart sounds Lungs:  Clear to auscultation bilaterally Abdomen:  Obese, soft, nontender, no organomegaly Musculoskeletal:  No DJD Skin:  Skin appears intact anteriorly  LABS: PULMONARY  Recent Labs Lab 11/02/12 1105  PHART 7.511*  PCO2ART 28.8*  PO2ART 196.0*  HCO3 23.0  TCO2 24  O2SAT 100.0    CBC  Recent Labs Lab 11/02/12 0825 11/03/12 0515  HGB 11.1* 11.0*  HCT 33.0* 32.5*  WBC 11.7* 7.4  PLT 102* 89*    COAGULATION No results found for this basename: INR,  in the last 168 hours  CARDIAC    Recent Labs Lab 11/02/12 0825 11/03/12 0515  TROPONINI <0.30 <0.30   No results found for this  basename: PROBNP,  in the last 168 hours   CHEMISTRY  Recent Labs Lab 11/02/12 0825 11/03/12 0515  NA 136 142  K 5.1 4.1  CL 94* 100  CO2 21 25  GLUCOSE 157* 102*  BUN 81* 32*  CREATININE 11.35* 6.37*  CALCIUM 8.0* 8.1*  MG 2.1 2.0  PHOS 7.1* 5.9*   Estimated Creatinine Clearance: 9.5 ml/min (by C-G formula based on Cr of 6.37).   LIVER  Recent Labs Lab 11/02/12 0825  AST 23  ALT 16  ALKPHOS 80  BILITOT 0.4  PROT 6.2  ALBUMIN 2.5*     INFECTIOUS No results found for this basename: LATICACIDVEN, PROCALCITON,  in the last 168 hours   ENDOCRINE CBG (last 3)   Recent Labs  11/03/12 0416 11/03/12 0821 11/03/12 1143  GLUCAP 99 110* 124*         IMAGING x48h  Dg Chest Port 1 View  11/03/2012   CLINICAL DATA:  Shortness of breath. Pulmonary edema.  EXAM: PORTABLE CHEST - 1 VIEW  COMPARISON:  Single view of the chest 11/02/2012.  FINDINGS: The patient's endotracheal tube has been removed. Bilateral airspace disease has improved. Heart size is mildly enlarged. No pneumothorax is identified.  IMPRESSION: Status post extubation.  Improved bilateral airspace disease compatible with decreased edema.   Electronically Signed   By: Drusilla Kanner M.D.   On: 11/03/2012 05:44   Dg Chest Port 1 View  11/02/2012   CLINICAL DATA:  Intubated patient.  EXAM: PORTABLE CHEST - 1 VIEW  COMPARISON:  Single view of the chest 11/02/2012 at 12:16 a.m.  FINDINGS: Endotracheal tube remains in place with the tip in good position at the level of clavicular heads. Diffuse bilateral airspace disease persists but appears mildly improved. Heart size is upper normal. No pneumothorax is identified.  IMPRESSION: ET tube in good position. Persistent but improved diffuse bilateral airspace disease compatible with edema or pneumonia.   Electronically Signed   By: Drusilla Kanner M.D.   On: 11/02/2012 04:28   Dg Abd Portable 1v  11/02/2012   *RADIOLOGY REPORT*  Clinical Data: Evaluate  placement of NG tube, initial encounter.  PORTABLE ABDOMEN - 1 VIEW  Comparison: 01/08/2013; CT abdomen pelvis - 01/29/2012  Findings:  An enteric tube tip and side port project over the expected location of the gastric antrum.  Moderate colonic stool burden without evidence of obstruction.  No supine evidence of pneumoperitoneum.  No definite pneumatosis or portal venous gas.  Post cholecystectomy. No definite abnormal intra-abdominal calcifications.  Limited visualization of the lower thorax suggests mild elevation of the right hemidiaphragm.  Post lower lumbar paraspinal fusion and intervertebral disc space replacement, incompletely evaluated.  IMPRESSION: 1.  Enteric tube tip and side port project over the gastric antrum. 2.  Moderate colonic stool burden without evidence of obstruction.   Original Report Authenticated By: Tacey Ruiz, MD       ASSESSMENT / PLAN:  PULMONARY A: Acute pulmonary edema due to severe hypertension due to volume overload resulting in acute respiratory failure  11/03/12: s/p extubatio yesterday. CXR stil wet a bit  P:   Pulm toilet  CARDIOVASCULAR A: Acute pulmonary edema. Improved after HD 9/23.No mI  P:  Monitor after HD 11/03/12  RENAL A:  End-stage renal disease. History of dry weight being adjusted up and less volume removal one week prior to admission  11/03/12:  One moreHD pending today P:   Per renal   GASTROINTESTINAL A:  History of diverticulosis and liver transplant. On maintenance tacrolimus. LFT normal P:   Renal diet  HEMATOLOGIC A:  Anemia of chronic disease and renal disease P:  Packed red blood cells for hemoglobin less than 7 g percent  INFECTIOUS A:   No evidence of infection P:   Monitor off antibiotics  ENDOCRINE A:  Diabetes   P:   ssi   NEUROLOGIC A:  Intact off vent P:   monitr   TODAY'S SUMMARY:   11/02/2012: Her daughters are updated. Aim to extubate after dialysis. Will call nephrology  11/03/12: Move  to floor. On PCCM . ? Dc 11/05/12. Reordered lot of home meds      Dr. Kalman Shan, M.D., [facility].C.P Pulmonary and Critical Care Medicine Staff Physician [facility] Pager: [phone], If no answer or between  15:00h - 7:00h: call [phone]  11/03/2012 11:58 AM

## 2012-11-04 DIAGNOSIS — J810 Acute pulmonary edema: Secondary | ICD-10-CM | POA: Diagnosis not present

## 2012-11-04 DIAGNOSIS — J9600 Acute respiratory failure, unspecified whether with hypoxia or hypercapnia: Secondary | ICD-10-CM | POA: Diagnosis not present

## 2012-11-04 DIAGNOSIS — I5041 Acute combined systolic (congestive) and diastolic (congestive) heart failure: Secondary | ICD-10-CM

## 2012-11-04 DIAGNOSIS — E8779 Other fluid overload: Secondary | ICD-10-CM | POA: Diagnosis not present

## 2012-11-04 DIAGNOSIS — J811 Chronic pulmonary edema: Secondary | ICD-10-CM | POA: Diagnosis not present

## 2012-11-04 DIAGNOSIS — N186 End stage renal disease: Secondary | ICD-10-CM | POA: Diagnosis not present

## 2012-11-04 LAB — CBC WITH DIFFERENTIAL/PLATELET
Basophils Absolute: 0 10*3/uL (ref 0.0–0.1)
Basophils Relative: 0 % (ref 0–1)
Eosinophils Absolute: 0.1 10*3/uL (ref 0.0–0.7)
HCT: 33.5 % — ABNORMAL LOW (ref 36.0–46.0)
MCH: 32.7 pg (ref 26.0–34.0)
MCHC: 33.4 g/dL (ref 30.0–36.0)
Neutrophils Relative %: 67 % (ref 43–77)
Platelets: 97 10*3/uL — ABNORMAL LOW (ref 150–400)
RBC: 3.42 MIL/uL — ABNORMAL LOW (ref 3.87–5.11)
RDW: 14.7 % (ref 11.5–15.5)

## 2012-11-04 LAB — GLUCOSE, CAPILLARY
Glucose-Capillary: 146 mg/dL — ABNORMAL HIGH (ref 70–99)
Glucose-Capillary: 248 mg/dL — ABNORMAL HIGH (ref 70–99)

## 2012-11-04 LAB — PHOSPHORUS: Phosphorus: 4.9 mg/dL — ABNORMAL HIGH (ref 2.3–4.6)

## 2012-11-04 LAB — BASIC METABOLIC PANEL
GFR calc Af Amer: 9 mL/min — ABNORMAL LOW (ref >90)
GFR calc non Af Amer: 8 mL/min — ABNORMAL LOW (ref >90)
Potassium: 4.6 mEq/L (ref 3.5–5.1)
Sodium: 135 mEq/L (ref 135–145)

## 2012-11-04 LAB — MAGNESIUM: Magnesium: 2.2 mg/dL (ref 1.5–2.5)

## 2012-11-04 MED ORDER — DOXERCALCIFEROL 4 MCG/2ML IV SOLN
INTRAVENOUS | Status: AC
  Filled 2012-11-04: qty 2

## 2012-11-04 MED ORDER — INSULIN ASPART 100 UNIT/ML ~~LOC~~ SOLN
0.0000 [IU] | SUBCUTANEOUS | Status: DC
  Administered 2012-11-04: 2 [IU] via SUBCUTANEOUS
  Administered 2012-11-04: 3 [IU] via SUBCUTANEOUS
  Administered 2012-11-04: 1 [IU] via SUBCUTANEOUS

## 2012-11-04 MED ORDER — PENTAFLUOROPROP-TETRAFLUOROETH EX AERO: 1.0000 "application " | INHALATION_SPRAY | CUTANEOUS | Status: DC | PRN

## 2012-11-04 MED ORDER — LIDOCAINE HCL (PF) 1 % IJ SOLN: 5.0000 mL | INTRAMUSCULAR | Status: DC | PRN

## 2012-11-04 MED ORDER — HEPARIN SODIUM (PORCINE) 1000 UNIT/ML DIALYSIS: 1000.0000 [IU] | INTRAMUSCULAR | Status: DC | PRN

## 2012-11-04 MED ORDER — ALTEPLASE 2 MG IJ SOLR
2.0000 mg | Freq: Once | INTRAMUSCULAR | Status: DC | PRN
  Filled 2012-11-04: qty 2

## 2012-11-04 MED ORDER — DEXTROSE 5 % IV SOLN
INTRAVENOUS | Status: DC
  Administered 2012-11-04: 1000 mL via INTRAVENOUS

## 2012-11-04 MED ORDER — LIDOCAINE-PRILOCAINE 2.5-2.5 % EX CREA
1.0000 "application " | TOPICAL_CREAM | CUTANEOUS | Status: DC | PRN
  Filled 2012-11-04: qty 5

## 2012-11-04 MED ORDER — NEPRO/CARBSTEADY PO LIQD
237.0000 mL | ORAL | Status: DC | PRN
  Filled 2012-11-04: qty 237

## 2012-11-04 MED ORDER — SODIUM CHLORIDE 0.9 % IV SOLN: 100.0000 mL | INTRAVENOUS | Status: DC | PRN

## 2012-11-04 MED ORDER — DOXERCALCIFEROL 4 MCG/2ML IV SOLN
2.0000 ug | INTRAVENOUS | Status: DC
  Administered 2012-11-04: 2 ug via INTRAVENOUS

## 2012-11-04 MED ORDER — TACROLIMUS 1 MG/ML ORAL SUSPENSION
5.0000 mg | Freq: Two times a day (BID) | ORAL | Status: DC
  Administered 2012-11-04: 5 mg via ORAL
  Filled 2012-11-04 (×3): qty 5

## 2012-11-04 NOTE — Progress Notes (Signed)
Renal Service Daily Progress Note [facility]  Subjective: 2.5 kg off w HD yest, no BP drops  Physical Exam:  Blood pressure 127/60, pulse 79, temperature 97.3 F (36.3 C), temperature source Oral, resp. rate 16, height 5\' 4"  (1.626 m), weight 73.1 kg (161 lb 2.5 oz), SpO2 100.00%. Gen: up in chair, alert, no distress Neck: no jvd Chest: clear bilat CV: regular, distant HS, no rub or gallop noted  Abdomen: soft, nontender, no HSM or ascites  Ext: no LE or UE edema        Neuro: alert, nonfocal Access: L thigh AVG patent  Outpatient HD: (TTS at [place])  4hrs    72kg    2K/2.25Ca     AVG L thigh    Profile linear     400/A1.5    Heparin none  Hectorol 2ug    EPO none     Venofer 50mg /wk   Assessment/Plan:  1. Acute resp failure / pulm edema / HTN crisis- unclear cause, too much fluid intake and/or lean body wt loss. Max UF with HD today and lower dry wt as tolerated. Compliant with BP meds (metoprolol only) 2. ESRD- usual HD TTS 3. Anemia of CKD- no epo, Hb 11 4. Metabolic bone disease- cont vit D, sensipar 5. Hypertension/volume- metoprolol, vol control 6. Hx liver transplant 2011 / hep CLily Lovings  MD Pager [phone]    Cell  [phone] 11/04/2012, 10:30 AM    Recent Labs Lab 11/02/12 0825 11/03/12 0515 11/04/12 0438  NA 136 142 135  K 5.1 4.1 4.6  CL 94* 100 96  CO2 21 25 27   GLUCOSE 157* 102* 152*  BUN 81* 32* 31*  CREATININE 11.35* 6.37* 5.55*  CALCIUM 8.0* 8.1* 9.2  PHOS 7.1* 5.9* 4.9*    Recent Labs Lab 11/02/12 0825  AST 23  ALT 16  ALKPHOS 80  BILITOT 0.4  PROT 6.2  ALBUMIN 2.5*    Recent Labs Lab 11/02/12 0825 11/03/12 0515 11/04/12 0438  WBC 11.7* 7.4 5.5  NEUTROABS 9.0* 5.0 3.7  HGB 11.1* 11.0* 11.2*  HCT 33.0* 32.5* 33.5*  MCV 95.1 96.4 98.0  PLT 102* 89* 97*   . aspirin EC  81 mg Oral Daily  . calcium carbonate  1 tablet Oral Daily  . cinacalcet  30 mg Oral Q breakfast  . docusate sodium  100 mg  Oral BID  . DULoxetine  60 mg Oral Daily  . ferric gluconate (FERRLECIT/NULECIT) IV  62.5 mg Intravenous Q Thu-HD  . gabapentin  300 mg Oral Daily  . heparin subcutaneous  5,000 Units Subcutaneous Q8H  . insulin aspart  0-9 Units Subcutaneous Q4H  . levothyroxine  150 mcg Oral QAC breakfast  . metoprolol tartrate  25 mg Oral QHS  . [START ON 11/05/2012] metoprolol tartrate  25 mg Oral Custom  . multivitamin  1 tablet Oral QHS  . pantoprazole  40 mg Oral Q1200  . tacrolimus  5 mg Oral BID   . dextrose 1,000 mL (11/04/12 0029)   albuterol, dicyclomine, influenza vac split quadrivalent PF, labetalol, nitroGLYCERIN, pneumococcal 23 valent vaccine, polyethylene glycol, traMADol

## 2012-11-04 NOTE — Procedures (Signed)
I was present at this dialysis session. I have reviewed the session itself and made appropriate changes.   Rob Nachman Sundt  MD Pager [phone]    Cell  [phone] 11/04/2012, 2:36 PM    

## 2012-11-04 NOTE — Progress Notes (Signed)
Pt cbg 48, patient symptomatic. D50 given again. Elink called for further orders.

## 2012-11-04 NOTE — Discharge Summary (Signed)
Physician Discharge Summary     Patient ID: [name] MRN: [id number] DOB/AGE: 02-19-1955 57 y.o.  Admit date: 11/02/2012 Discharge date: 11/04/2012  Discharge Diagnoses:  Acute respiratory Failure in setting of pulmonary edema/ volume overload ESRD H/o Liver transplant  Anemia of chronic disease DM Hypothyroidism   Detailed Hospital Course:   57 year old female, end-stage renal disease on Tuesday Thursday Saturday dialysis. History of liver transplant for cirrhosis and 2011 along with medical problems that include coronary artery disease, obesity, peripheral vascular disease, obstructive lung disease, multiple surgical procedures, DJD, history of recurrent pneumonias and diverticulosis. In the past week prior to admit there was  apparently concern that patient was getting hypotensive with too much volume removal and therefore the dry weight was adjusted up. She's been compliant with her dialysis and on 11/01/2012 started feeling exhausted and tired and by evening became dyspneic and taken to [facility] emergency room where she was in respirator stress and intubated. She was then transferred o September 23 14 to [facility] with hypertension and acute pulmonary edema and acute respiratory failure. Pulmonary critical care medicine admitting patient.   Upon arrival to ICU nephrology consult was obtained and she received emergent HD in the ICU. After volume removal with dialysis she was successfully weaned and extubated from the ventilator. She remained volume overloaded and received additional HD on 9/24 and again on 9/25. Hospital course essentially unremarkable except for isolated episodes of hypoglycemia. Her new dry weight as of d/c is ~69.1.  She had met maximum benefit from in-patient care as of 9/25 and was deemed ready for d/c. She will be discharged to home with the following plan of care per her active problems listed below.     Discharge Plan by diagnoses   End-stage renal  disease Acute pulmonary edema. Improved after HD 9/23.No mI Plan: Resume all prior meds F/u for routine HD on sat 9/27  History of diverticulosis and liver transplant.  Plan: On maintenance tacrolimus. 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   Significant Hospital tests/ studies/ interventions and procedures  Admit 11/02/2012  ETT 11/02/12>>9./2/14  Consults: nephrology   Discharge Exam: BP 160/69  Pulse 80  Temp(Src) 97.3 F (36.3 C) (Oral)  Resp 6  Ht 5\' 4"  (1.626 m)  Wt 73.1 kg (161 lb 2.5 oz)  BMI 27.65 kg/m2  SpO2 98%  PHYSICAL EXAMINATION:  General: Obese female lookschronically unwell  Neuro: RASS sedation score 0 AXOx3. Sitting. Talking. Wants to eat  HEENT: Intubated. Neck is supple no neck nodes  Cardiovascular: Hypertensive on wake up assessment. Normal heart sounds  Lungs: Clear to auscultation bilaterally  Abdomen: Obese, soft, nontender, no organomegaly  Musculoskeletal: No DJD  Skin: Skin appears intact anteriorly   Labs at discharge Lab Results  Component Value Date   CREATININE 5.55* 11/04/2012   BUN 31* 11/04/2012   NA 135 11/04/2012   K 4.6 11/04/2012   CL 96 11/04/2012   CO2 27 11/04/2012   Lab Results  Component Value Date   WBC 5.5 11/04/2012   HGB 11.2* 11/04/2012   HCT 33.5* 11/04/2012   MCV 98.0 11/04/2012   PLT 97* 11/04/2012   Lab Results  Component Value Date   ALT 16 11/02/2012   AST 23 11/02/2012   ALKPHOS 80 11/02/2012   BILITOT 0.4 11/02/2012   Lab Results  Component Value Date   INR 0.94  07/27/2012   INR 0.95 12/22/2011   INR 0.93 12/23/2010    Current radiology studies Dg Chest Port 1 View  11/03/2012   CLINICAL DATA:  Shortness of breath. Pulmonary edema.  EXAM: PORTABLE CHEST - 1 VIEW  COMPARISON:  Single view of the chest 11/02/2012.  FINDINGS: The patient's endotracheal tube has been removed. Bilateral airspace  disease has improved. Heart size is mildly enlarged. No pneumothorax is identified.  IMPRESSION: Status post extubation.  Improved bilateral airspace disease compatible with decreased edema.   Electronically Signed   By: Drusilla Kanner M.D.   On: 11/03/2012 05:44    Disposition:  01-Home or Self Care      Discharge Orders   Future Orders Complete By Expires   Diet - low sodium heart healthy  As directed    Increase activity slowly  As directed        Medication List         albuterol 90 MCG/ACT inhaler  Commonly known as:  PROVENTIL,VENTOLIN  Inhale 2 puffs into the lungs every 4 (four) hours as needed. For shortness of breath     aspirin EC 81 MG tablet  Take 81 mg by mouth daily.     calcium carbonate 500 MG chewable tablet  Commonly known as:  TUMS - dosed in mg elemental calcium  Chew 1 tablet by mouth daily.     cinacalcet 30 MG tablet  Commonly known as:  SENSIPAR  Take 30 mg by mouth daily.     dextromethorphan-guaiFENesin 30-600 MG per 12 hr tablet  Commonly known as:  MUCINEX DM  Take 1 tablet by mouth 2 (two) times daily.     dicyclomine 10 MG capsule  Commonly known as:  BENTYL  Take 10 mg by mouth every 6 (six) hours as needed (for stomach pain).     docusate sodium 100 MG capsule  Commonly known as:  COLACE  Take 100 mg by mouth 2 (two) times daily.     doxercalciferol 4 MCG/2ML injection  Commonly known as:  HECTOROL  Inject 4 mLs (8 mcg total) into the vein Every Tuesday,Thursday,and Saturday with dialysis.     DULoxetine 60 MG capsule  Commonly known as:  CYMBALTA  Take 60 mg by mouth daily.     gabapentin 300 MG capsule  Commonly known as:  NEURONTIN  Take 300 mg by mouth daily.     insulin detemir 100 UNIT/ML injection  Commonly known as:  LEVEMIR  Inject 5 Units into the skin at bedtime as needed. Uses only if blood sugar levels are over 150     levothyroxine 150 MCG tablet  Commonly known as:  SYNTHROID, LEVOTHROID  Take 150 mcg by  mouth daily before breakfast.     metoprolol tartrate 25 MG tablet  Commonly known as:  LOPRESSOR  - Take 25 mg by mouth See admin instructions. Take 1 tablet at bedtime on dialysis days-Tues,Thurs sat.  - Takes 1 tablet twice daily on Mon,wednesday, friday and sunday     multivitamin Tabs tablet  Take 1 tablet by mouth daily.     nitroGLYCERIN 0.4 MG SL tablet  Commonly known as:  NITROSTAT  Place 1 tablet (0.4 mg total) under the tongue every 5 (five) minutes as needed for chest pain (for severe chest pressure, tightness).     omeprazole 20 MG capsule  Commonly known as:  PRILOSEC  Take 20 mg by mouth as needed (acid reflux).     polyethylene glycol packet  Commonly known as:  MIRALAX / GLYCOLAX  Take 17 g by mouth as needed (constipation).     promethazine 12.5 MG tablet  Commonly known as:  PHENERGAN  Take 12.5 mg by mouth every 6 (six) hours as needed. For nausea     sucralfate 1 G tablet  Commonly known as:  CARAFATE  Take 1 g by mouth daily.     tacrolimus 1 MG capsule  Commonly known as:  PROGRAF  Take 5 mg by mouth 2 (two) times daily.     traMADol 50 MG tablet  Commonly known as:  ULTRAM  Take 50 mg by mouth every 6 (six) hours as needed. pain       Follow-up Information   Follow up with [name], MD. (As needed)    Specialty:  Internal Medicine   Contact information:   [address] [place] [address] [phone]       Follow up with webb. (keep your scheduled dialysis appointments)       Discharged Condition: good  Physician Statement:   The Patient was personally examined, the discharge assessment and plan has been personally reviewed and I agree with ACNP Kymberley Raz's assessment and plan. > 30 minutes of time have been dedicated to discharge assessment, planning and discharge instructions.   Signed: Alicea Wente,PETE 11/04/2012, 1:48 PM

## 2012-11-04 NOTE — Evaluation (Signed)
Physical Therapy Evaluation Patient Details Name: [name] MRN: [id number] DOB: November 25, 1955 Today's Date: 11/04/2012 Time: 2952-8413 PT Time Calculation (min): 25 min  PT Assessment / Plan / Recommendation History of Present Illness  57 year old female, end-stage renal disease on Tuesday Thursday Saturday dialysis. History of liver transplant for cirrhosis and 2011 along with medical problems that include coronary artery disease, obesity, peripheral vascular disease, obstructive lung disease, multiple surgical procedures, DJD, history of recurrent pneumonias and diverticulosis. In the past week prior to admit there was  apparently concern that patient was getting hypotensive with too much volume removal and therefore the dry weight was adjusted up. She's been compliant with her dialysis and on 11/01/2012 started feeling exhausted and tired and by evening became dyspneic and taken to [facility] emergency room where she was in respirator stress and intubated. She was then transferred o September 23 14 to [facility] with hypertension and acute pulmonary edema and acute respiratory failure. Pulmonary critical care medicine admitting patient.     Clinical Impression  Patient demonstrates deficits in functional mobility as indicated below. Pt will benefit from continued skilled PT to address deficits and maximize independence. Will continue to see as indicated discharge with family supervision and PRN assist.    PT Assessment  Patient needs continued PT services    Follow Up Recommendations  Supervision - Intermittent          Equipment Recommendations  None recommended by PT       Frequency Min 3X/week    Precautions / Restrictions Precautions Precautions: None   Pertinent Vitals/Pain Patient reports no paid at this time, vitals stable      Mobility  Bed Mobility Bed Mobility: Not assessed Transfers Transfers: Sit to Stand;Stand to Sit Sit to Stand: 5:  Supervision Stand to Sit: 5: Supervision Ambulation/Gait Ambulation/Gait Assistance: 5: Supervision Assistive device: Rolling walker Gait Pattern: Step-through pattern;Decreased stride length;Trunk flexed;Narrow base of support Gait velocity: decreased General Gait Details: some instability noted Stairs: No    Exercises Total Joint Exercises Ankle Circles/Pumps: AROM;Both;10 reps Long Arc Quad: AROM;Both;10 reps   PT Diagnosis: Difficulty walking;Generalized weakness  PT Problem List: Decreased strength;Decreased activity tolerance;Decreased balance;Decreased mobility PT Treatment Interventions: DME instruction;Gait training;Stair training;Functional mobility training;Therapeutic activities;Therapeutic exercise;Balance training;Patient/family education     PT Goals(Current goals can be found in the care plan section) Acute Rehab PT Goals Patient Stated Goal: to go home with family PT Goal Formulation: With patient Time For Goal Achievement: 11/18/12 Potential to Achieve Goals: Good  Visit Information  Last PT Received On: 11/04/12 Assistance Needed: +1 History of Present Illness: 57 year old female, end-stage renal disease on Tuesday Thursday Saturday dialysis. History of liver transplant for cirrhosis and 2011 along with medical problems that include coronary artery disease, obesity, peripheral vascular disease, obstructive lung disease, multiple surgical procedures, DJD, history of recurrent pneumonias and diverticulosis. In the past week prior to admit there was  apparently concern that patient was getting hypotensive with too much volume removal and therefore the dry weight was adjusted up. She's been compliant with her dialysis and on 11/01/2012 started feeling exhausted and tired and by evening became dyspneic and taken to [facility] emergency room where she was in respirator stress and intubated. She was then transferred o September 23 14 to [facility] with hypertension  and acute pulmonary edema and acute respiratory failure. Pulmonary critical care medicine admitting patient.         Prior Functioning  Home Living Family/patient expects to be discharged  to:: Private residence Living Arrangements: Children Available Help at Discharge: Available PRN/intermittently Type of Home: Mobile home Home Access: Stairs to enter Entrance Stairs-Number of Steps: 3 Entrance Stairs-Rails: Right Home Layout: One level Home Equipment: Walker - 2 wheels;Cane - single point Prior Function Level of Independence: Independent Communication Communication: No difficulties    Cognition  Cognition Arousal/Alertness: Awake/alert Overall Cognitive Status: Within Functional Limits for tasks assessed    Extremity/Trunk Assessment Upper Extremity Assessment Upper Extremity Assessment: Defer to OT evaluation Lower Extremity Assessment Lower Extremity Assessment: Overall WFL for tasks assessed   Balance Balance Balance Assessed: Yes Static Standing Balance Static Standing - Balance Support: No upper extremity supported Static Standing - Level of Assistance: 5: Stand by assistance  End of Session PT - End of Session Equipment Utilized During Treatment: Gait belt Activity Tolerance: Patient tolerated treatment well;Patient limited by fatigue Patient left: in chair;with call bell/phone within reach Nurse Communication: Mobility status  GP     Fabio Asa 11/04/2012, 2:33 PM Charlotte Crumb, PT DPT  [phone]

## 2012-11-04 NOTE — Progress Notes (Signed)
eLink Physician-Brief Progress Note Patient Name: [name] DOB: [birth year] MRN: [id number]  Date of Service  11/04/2012   HPI/Events of Note  Hypoglycemia - patient with ESRD on moderate SSI q4hr received insulin for blood sugar of greater than 200.  Now with hypoglycemia and AMS.  Received 2 amps of D50.   eICU Interventions  Plan: Change SSI to renal dosing Start D5W at [facility] for now Continue to monitor blood sugars      Zuriah Bordas 11/04/2012, 12:09 AM

## 2012-11-04 NOTE — Progress Notes (Signed)
Report given to Dialysis. Pt aware she is going to dialysis.Vital signs taken. Pt stable.

## 2012-11-04 NOTE — Progress Notes (Signed)
Patient d/c home with daugther by out going nurse. D/c instruction and teaching done. All questions and concerns addressed.

## 2012-11-06 DIAGNOSIS — D631 Anemia in chronic kidney disease: Secondary | ICD-10-CM | POA: Diagnosis not present

## 2012-11-06 DIAGNOSIS — N2581 Secondary hyperparathyroidism of renal origin: Secondary | ICD-10-CM | POA: Diagnosis not present

## 2012-11-06 DIAGNOSIS — D509 Iron deficiency anemia, unspecified: Secondary | ICD-10-CM | POA: Diagnosis not present

## 2012-11-06 DIAGNOSIS — N186 End stage renal disease: Secondary | ICD-10-CM | POA: Diagnosis not present

## 2012-11-09 DIAGNOSIS — N2581 Secondary hyperparathyroidism of renal origin: Secondary | ICD-10-CM | POA: Diagnosis not present

## 2012-11-09 DIAGNOSIS — D631 Anemia in chronic kidney disease: Secondary | ICD-10-CM | POA: Diagnosis not present

## 2012-11-09 DIAGNOSIS — D509 Iron deficiency anemia, unspecified: Secondary | ICD-10-CM | POA: Diagnosis not present

## 2012-11-09 DIAGNOSIS — N186 End stage renal disease: Secondary | ICD-10-CM | POA: Diagnosis not present

## 2012-11-10 DIAGNOSIS — N185 Chronic kidney disease, stage 5: Secondary | ICD-10-CM | POA: Diagnosis not present

## 2012-11-10 DIAGNOSIS — I10 Essential (primary) hypertension: Secondary | ICD-10-CM | POA: Diagnosis not present

## 2012-11-10 DIAGNOSIS — E039 Hypothyroidism, unspecified: Secondary | ICD-10-CM | POA: Diagnosis not present

## 2012-11-10 DIAGNOSIS — F064 Anxiety disorder due to known physiological condition: Secondary | ICD-10-CM | POA: Diagnosis not present

## 2012-11-11 DIAGNOSIS — D631 Anemia in chronic kidney disease: Secondary | ICD-10-CM | POA: Diagnosis not present

## 2012-11-11 DIAGNOSIS — N2581 Secondary hyperparathyroidism of renal origin: Secondary | ICD-10-CM | POA: Diagnosis not present

## 2012-11-11 DIAGNOSIS — N186 End stage renal disease: Secondary | ICD-10-CM | POA: Diagnosis not present

## 2012-11-13 DIAGNOSIS — N186 End stage renal disease: Secondary | ICD-10-CM | POA: Diagnosis not present

## 2012-11-13 DIAGNOSIS — D631 Anemia in chronic kidney disease: Secondary | ICD-10-CM | POA: Diagnosis not present

## 2012-11-13 DIAGNOSIS — N2581 Secondary hyperparathyroidism of renal origin: Secondary | ICD-10-CM | POA: Diagnosis not present

## 2012-11-16 DIAGNOSIS — N186 End stage renal disease: Secondary | ICD-10-CM | POA: Diagnosis not present

## 2012-11-16 DIAGNOSIS — D631 Anemia in chronic kidney disease: Secondary | ICD-10-CM | POA: Diagnosis not present

## 2012-11-16 DIAGNOSIS — N2581 Secondary hyperparathyroidism of renal origin: Secondary | ICD-10-CM | POA: Diagnosis not present

## 2012-11-16 NOTE — Discharge Summary (Signed)
STAFF  Dr. Kalman Shan, M.D., [facility].C.P Pulmonary and Critical Care Medicine Staff Physician [facility] Pager: [phone], If no answer or between  15:00h - 7:00h: call [phone]  11/16/2012 10:47 PM

## 2012-11-17 DIAGNOSIS — Z944 Liver transplant status: Secondary | ICD-10-CM | POA: Diagnosis not present

## 2012-11-17 DIAGNOSIS — Z79899 Other long term (current) drug therapy: Secondary | ICD-10-CM | POA: Diagnosis not present

## 2012-11-17 DIAGNOSIS — B1910 Unspecified viral hepatitis B without hepatic coma: Secondary | ICD-10-CM | POA: Diagnosis not present

## 2012-11-17 DIAGNOSIS — B182 Chronic viral hepatitis C: Secondary | ICD-10-CM | POA: Diagnosis not present

## 2012-11-18 DIAGNOSIS — N2581 Secondary hyperparathyroidism of renal origin: Secondary | ICD-10-CM | POA: Diagnosis not present

## 2012-11-18 DIAGNOSIS — D631 Anemia in chronic kidney disease: Secondary | ICD-10-CM | POA: Diagnosis not present

## 2012-11-18 DIAGNOSIS — N186 End stage renal disease: Secondary | ICD-10-CM | POA: Diagnosis not present

## 2012-11-20 DIAGNOSIS — D631 Anemia in chronic kidney disease: Secondary | ICD-10-CM | POA: Diagnosis not present

## 2012-11-20 DIAGNOSIS — N2581 Secondary hyperparathyroidism of renal origin: Secondary | ICD-10-CM | POA: Diagnosis not present

## 2012-11-20 DIAGNOSIS — N186 End stage renal disease: Secondary | ICD-10-CM | POA: Diagnosis not present

## 2012-11-23 DIAGNOSIS — N186 End stage renal disease: Secondary | ICD-10-CM | POA: Diagnosis not present

## 2012-11-23 DIAGNOSIS — D631 Anemia in chronic kidney disease: Secondary | ICD-10-CM | POA: Diagnosis not present

## 2012-11-23 DIAGNOSIS — N2581 Secondary hyperparathyroidism of renal origin: Secondary | ICD-10-CM | POA: Diagnosis not present

## 2012-11-24 DIAGNOSIS — N186 End stage renal disease: Secondary | ICD-10-CM | POA: Diagnosis not present

## 2012-11-25 DIAGNOSIS — D631 Anemia in chronic kidney disease: Secondary | ICD-10-CM | POA: Diagnosis not present

## 2012-11-25 DIAGNOSIS — N2581 Secondary hyperparathyroidism of renal origin: Secondary | ICD-10-CM | POA: Diagnosis not present

## 2012-11-25 DIAGNOSIS — N186 End stage renal disease: Secondary | ICD-10-CM | POA: Diagnosis not present

## 2012-11-27 DIAGNOSIS — N186 End stage renal disease: Secondary | ICD-10-CM | POA: Diagnosis not present

## 2012-11-27 DIAGNOSIS — N2581 Secondary hyperparathyroidism of renal origin: Secondary | ICD-10-CM | POA: Diagnosis not present

## 2012-11-27 DIAGNOSIS — D631 Anemia in chronic kidney disease: Secondary | ICD-10-CM | POA: Diagnosis not present

## 2012-11-30 DIAGNOSIS — D631 Anemia in chronic kidney disease: Secondary | ICD-10-CM | POA: Diagnosis not present

## 2012-11-30 DIAGNOSIS — N2581 Secondary hyperparathyroidism of renal origin: Secondary | ICD-10-CM | POA: Diagnosis not present

## 2012-11-30 DIAGNOSIS — N186 End stage renal disease: Secondary | ICD-10-CM | POA: Diagnosis not present

## 2012-12-02 DIAGNOSIS — D631 Anemia in chronic kidney disease: Secondary | ICD-10-CM | POA: Diagnosis not present

## 2012-12-02 DIAGNOSIS — Z944 Liver transplant status: Secondary | ICD-10-CM | POA: Diagnosis not present

## 2012-12-02 DIAGNOSIS — N2581 Secondary hyperparathyroidism of renal origin: Secondary | ICD-10-CM | POA: Diagnosis not present

## 2012-12-02 DIAGNOSIS — E1165 Type 2 diabetes mellitus with hyperglycemia: Secondary | ICD-10-CM | POA: Diagnosis not present

## 2012-12-02 DIAGNOSIS — N186 End stage renal disease: Secondary | ICD-10-CM | POA: Diagnosis not present

## 2012-12-04 DIAGNOSIS — N186 End stage renal disease: Secondary | ICD-10-CM | POA: Diagnosis not present

## 2012-12-04 DIAGNOSIS — D631 Anemia in chronic kidney disease: Secondary | ICD-10-CM | POA: Diagnosis not present

## 2012-12-04 DIAGNOSIS — N2581 Secondary hyperparathyroidism of renal origin: Secondary | ICD-10-CM | POA: Diagnosis not present

## 2012-12-06 DIAGNOSIS — B199 Unspecified viral hepatitis without hepatic coma: Secondary | ICD-10-CM | POA: Diagnosis not present

## 2012-12-06 DIAGNOSIS — D631 Anemia in chronic kidney disease: Secondary | ICD-10-CM | POA: Diagnosis not present

## 2012-12-06 DIAGNOSIS — N186 End stage renal disease: Secondary | ICD-10-CM | POA: Diagnosis not present

## 2012-12-06 DIAGNOSIS — E119 Type 2 diabetes mellitus without complications: Secondary | ICD-10-CM | POA: Diagnosis not present

## 2012-12-06 DIAGNOSIS — N2581 Secondary hyperparathyroidism of renal origin: Secondary | ICD-10-CM | POA: Diagnosis not present

## 2012-12-07 DIAGNOSIS — Z992 Dependence on renal dialysis: Secondary | ICD-10-CM | POA: Diagnosis not present

## 2012-12-07 DIAGNOSIS — N186 End stage renal disease: Secondary | ICD-10-CM | POA: Diagnosis not present

## 2012-12-07 DIAGNOSIS — N2581 Secondary hyperparathyroidism of renal origin: Secondary | ICD-10-CM | POA: Diagnosis not present

## 2012-12-09 DIAGNOSIS — N2581 Secondary hyperparathyroidism of renal origin: Secondary | ICD-10-CM | POA: Diagnosis not present

## 2012-12-09 DIAGNOSIS — Z992 Dependence on renal dialysis: Secondary | ICD-10-CM | POA: Diagnosis not present

## 2012-12-09 DIAGNOSIS — N186 End stage renal disease: Secondary | ICD-10-CM | POA: Diagnosis not present

## 2012-12-10 DIAGNOSIS — N186 End stage renal disease: Secondary | ICD-10-CM | POA: Diagnosis not present

## 2012-12-11 DIAGNOSIS — D631 Anemia in chronic kidney disease: Secondary | ICD-10-CM | POA: Diagnosis not present

## 2012-12-11 DIAGNOSIS — N186 End stage renal disease: Secondary | ICD-10-CM | POA: Diagnosis not present

## 2012-12-11 DIAGNOSIS — N2581 Secondary hyperparathyroidism of renal origin: Secondary | ICD-10-CM | POA: Diagnosis not present

## 2012-12-14 DIAGNOSIS — D631 Anemia in chronic kidney disease: Secondary | ICD-10-CM | POA: Diagnosis not present

## 2012-12-14 DIAGNOSIS — N2581 Secondary hyperparathyroidism of renal origin: Secondary | ICD-10-CM | POA: Diagnosis not present

## 2012-12-14 DIAGNOSIS — N186 End stage renal disease: Secondary | ICD-10-CM | POA: Diagnosis not present

## 2012-12-16 DIAGNOSIS — N186 End stage renal disease: Secondary | ICD-10-CM | POA: Diagnosis not present

## 2012-12-16 DIAGNOSIS — D631 Anemia in chronic kidney disease: Secondary | ICD-10-CM | POA: Diagnosis not present

## 2012-12-16 DIAGNOSIS — N2581 Secondary hyperparathyroidism of renal origin: Secondary | ICD-10-CM | POA: Diagnosis not present

## 2012-12-18 DIAGNOSIS — N2581 Secondary hyperparathyroidism of renal origin: Secondary | ICD-10-CM | POA: Diagnosis not present

## 2012-12-18 DIAGNOSIS — N186 End stage renal disease: Secondary | ICD-10-CM | POA: Diagnosis not present

## 2012-12-18 DIAGNOSIS — D631 Anemia in chronic kidney disease: Secondary | ICD-10-CM | POA: Diagnosis not present

## 2012-12-21 DIAGNOSIS — D631 Anemia in chronic kidney disease: Secondary | ICD-10-CM | POA: Diagnosis not present

## 2012-12-21 DIAGNOSIS — N2581 Secondary hyperparathyroidism of renal origin: Secondary | ICD-10-CM | POA: Diagnosis not present

## 2012-12-21 DIAGNOSIS — N186 End stage renal disease: Secondary | ICD-10-CM | POA: Diagnosis not present

## 2012-12-23 DIAGNOSIS — D631 Anemia in chronic kidney disease: Secondary | ICD-10-CM | POA: Diagnosis not present

## 2012-12-23 DIAGNOSIS — N2581 Secondary hyperparathyroidism of renal origin: Secondary | ICD-10-CM | POA: Diagnosis not present

## 2012-12-23 DIAGNOSIS — N186 End stage renal disease: Secondary | ICD-10-CM | POA: Diagnosis not present

## 2012-12-24 DIAGNOSIS — K767 Hepatorenal syndrome: Secondary | ICD-10-CM | POA: Diagnosis not present

## 2012-12-24 DIAGNOSIS — Z7682 Awaiting organ transplant status: Secondary | ICD-10-CM | POA: Diagnosis not present

## 2012-12-24 DIAGNOSIS — Z944 Liver transplant status: Secondary | ICD-10-CM | POA: Diagnosis not present

## 2012-12-24 DIAGNOSIS — B1920 Unspecified viral hepatitis C without hepatic coma: Secondary | ICD-10-CM | POA: Diagnosis not present

## 2012-12-24 DIAGNOSIS — Z0183 Encounter for blood typing: Secondary | ICD-10-CM | POA: Diagnosis not present

## 2012-12-24 DIAGNOSIS — K730 Chronic persistent hepatitis, not elsewhere classified: Secondary | ICD-10-CM | POA: Diagnosis not present

## 2012-12-24 DIAGNOSIS — Z992 Dependence on renal dialysis: Secondary | ICD-10-CM | POA: Diagnosis not present

## 2012-12-24 DIAGNOSIS — N186 End stage renal disease: Secondary | ICD-10-CM | POA: Diagnosis not present

## 2012-12-24 DIAGNOSIS — Z862 Personal history of diseases of the blood and blood-forming organs and certain disorders involving the immune mechanism: Secondary | ICD-10-CM | POA: Diagnosis not present

## 2012-12-24 DIAGNOSIS — Z01811 Encounter for preprocedural respiratory examination: Secondary | ICD-10-CM | POA: Diagnosis not present

## 2012-12-24 DIAGNOSIS — Z01818 Encounter for other preprocedural examination: Secondary | ICD-10-CM | POA: Diagnosis not present

## 2012-12-24 DIAGNOSIS — Z79899 Other long term (current) drug therapy: Secondary | ICD-10-CM | POA: Diagnosis not present

## 2012-12-24 DIAGNOSIS — Z113 Encounter for screening for infections with a predominantly sexual mode of transmission: Secondary | ICD-10-CM | POA: Diagnosis not present

## 2012-12-25 DIAGNOSIS — N186 End stage renal disease: Secondary | ICD-10-CM | POA: Diagnosis not present

## 2012-12-25 DIAGNOSIS — D509 Iron deficiency anemia, unspecified: Secondary | ICD-10-CM | POA: Diagnosis not present

## 2012-12-25 DIAGNOSIS — N2581 Secondary hyperparathyroidism of renal origin: Secondary | ICD-10-CM | POA: Diagnosis not present

## 2012-12-25 DIAGNOSIS — D631 Anemia in chronic kidney disease: Secondary | ICD-10-CM | POA: Diagnosis not present

## 2012-12-28 DIAGNOSIS — D509 Iron deficiency anemia, unspecified: Secondary | ICD-10-CM | POA: Diagnosis not present

## 2012-12-28 DIAGNOSIS — N2581 Secondary hyperparathyroidism of renal origin: Secondary | ICD-10-CM | POA: Diagnosis not present

## 2012-12-28 DIAGNOSIS — D631 Anemia in chronic kidney disease: Secondary | ICD-10-CM | POA: Diagnosis not present

## 2012-12-28 DIAGNOSIS — N186 End stage renal disease: Secondary | ICD-10-CM | POA: Diagnosis not present

## 2012-12-30 DIAGNOSIS — N2581 Secondary hyperparathyroidism of renal origin: Secondary | ICD-10-CM | POA: Diagnosis not present

## 2012-12-30 DIAGNOSIS — D631 Anemia in chronic kidney disease: Secondary | ICD-10-CM | POA: Diagnosis not present

## 2012-12-30 DIAGNOSIS — D509 Iron deficiency anemia, unspecified: Secondary | ICD-10-CM | POA: Diagnosis not present

## 2012-12-30 DIAGNOSIS — N186 End stage renal disease: Secondary | ICD-10-CM | POA: Diagnosis not present

## 2013-01-01 DIAGNOSIS — D631 Anemia in chronic kidney disease: Secondary | ICD-10-CM | POA: Diagnosis not present

## 2013-01-01 DIAGNOSIS — N2581 Secondary hyperparathyroidism of renal origin: Secondary | ICD-10-CM | POA: Diagnosis not present

## 2013-01-01 DIAGNOSIS — D509 Iron deficiency anemia, unspecified: Secondary | ICD-10-CM | POA: Diagnosis not present

## 2013-01-01 DIAGNOSIS — N186 End stage renal disease: Secondary | ICD-10-CM | POA: Diagnosis not present

## 2013-01-04 DIAGNOSIS — D509 Iron deficiency anemia, unspecified: Secondary | ICD-10-CM | POA: Diagnosis not present

## 2013-01-04 DIAGNOSIS — N2581 Secondary hyperparathyroidism of renal origin: Secondary | ICD-10-CM | POA: Diagnosis not present

## 2013-01-04 DIAGNOSIS — D631 Anemia in chronic kidney disease: Secondary | ICD-10-CM | POA: Diagnosis not present

## 2013-01-04 DIAGNOSIS — N186 End stage renal disease: Secondary | ICD-10-CM | POA: Diagnosis not present

## 2013-01-06 DIAGNOSIS — N2581 Secondary hyperparathyroidism of renal origin: Secondary | ICD-10-CM | POA: Diagnosis not present

## 2013-01-06 DIAGNOSIS — D509 Iron deficiency anemia, unspecified: Secondary | ICD-10-CM | POA: Diagnosis not present

## 2013-01-06 DIAGNOSIS — D631 Anemia in chronic kidney disease: Secondary | ICD-10-CM | POA: Diagnosis not present

## 2013-01-06 DIAGNOSIS — N186 End stage renal disease: Secondary | ICD-10-CM | POA: Diagnosis not present

## 2013-01-08 DIAGNOSIS — N186 End stage renal disease: Secondary | ICD-10-CM | POA: Diagnosis not present

## 2013-01-08 DIAGNOSIS — D509 Iron deficiency anemia, unspecified: Secondary | ICD-10-CM | POA: Diagnosis not present

## 2013-01-08 DIAGNOSIS — D631 Anemia in chronic kidney disease: Secondary | ICD-10-CM | POA: Diagnosis not present

## 2013-01-08 DIAGNOSIS — N2581 Secondary hyperparathyroidism of renal origin: Secondary | ICD-10-CM | POA: Diagnosis not present

## 2013-01-09 DIAGNOSIS — N186 End stage renal disease: Secondary | ICD-10-CM | POA: Diagnosis not present

## 2013-01-11 DIAGNOSIS — E1165 Type 2 diabetes mellitus with hyperglycemia: Secondary | ICD-10-CM | POA: Diagnosis not present

## 2013-01-11 DIAGNOSIS — N186 End stage renal disease: Secondary | ICD-10-CM | POA: Diagnosis not present

## 2013-01-11 DIAGNOSIS — D509 Iron deficiency anemia, unspecified: Secondary | ICD-10-CM | POA: Diagnosis not present

## 2013-01-11 DIAGNOSIS — D631 Anemia in chronic kidney disease: Secondary | ICD-10-CM | POA: Diagnosis not present

## 2013-01-11 DIAGNOSIS — N2581 Secondary hyperparathyroidism of renal origin: Secondary | ICD-10-CM | POA: Diagnosis not present

## 2013-01-12 DIAGNOSIS — I10 Essential (primary) hypertension: Secondary | ICD-10-CM | POA: Diagnosis not present

## 2013-01-12 DIAGNOSIS — N185 Chronic kidney disease, stage 5: Secondary | ICD-10-CM | POA: Diagnosis not present

## 2013-01-12 DIAGNOSIS — F064 Anxiety disorder due to known physiological condition: Secondary | ICD-10-CM | POA: Diagnosis not present

## 2013-01-12 DIAGNOSIS — E785 Hyperlipidemia, unspecified: Secondary | ICD-10-CM | POA: Diagnosis not present

## 2013-01-21 DIAGNOSIS — Z1231 Encounter for screening mammogram for malignant neoplasm of breast: Secondary | ICD-10-CM | POA: Diagnosis not present

## 2013-01-28 DIAGNOSIS — Z79899 Other long term (current) drug therapy: Secondary | ICD-10-CM | POA: Diagnosis not present

## 2013-01-28 DIAGNOSIS — Z944 Liver transplant status: Secondary | ICD-10-CM | POA: Diagnosis not present

## 2013-02-05 ENCOUNTER — Observation Stay (HOSPITAL_COMMUNITY)
Admission: EM | Admit: 2013-02-05 | Discharge: 2013-02-07 | Disposition: A | Discharge status: Home / Self Care | Payer: Medicare Other | Attending: Physician Assistant | Admitting: Physician Assistant

## 2013-02-05 ENCOUNTER — Emergency Department (HOSPITAL_COMMUNITY): Payer: Medicare Other

## 2013-02-05 ENCOUNTER — Encounter (HOSPITAL_COMMUNITY): Payer: Self-pay | Admitting: Emergency Medicine

## 2013-02-05 DIAGNOSIS — N2581 Secondary hyperparathyroidism of renal origin: Secondary | ICD-10-CM | POA: Diagnosis not present

## 2013-02-05 DIAGNOSIS — J449 Chronic obstructive pulmonary disease, unspecified: Secondary | ICD-10-CM | POA: Diagnosis not present

## 2013-02-05 DIAGNOSIS — I959 Hypotension, unspecified: Secondary | ICD-10-CM

## 2013-02-05 DIAGNOSIS — I5042 Chronic combined systolic (congestive) and diastolic (congestive) heart failure: Secondary | ICD-10-CM | POA: Diagnosis present

## 2013-02-05 DIAGNOSIS — I350 Nonrheumatic aortic (valve) stenosis: Secondary | ICD-10-CM

## 2013-02-05 DIAGNOSIS — R079 Chest pain, unspecified: Secondary | ICD-10-CM

## 2013-02-05 DIAGNOSIS — E039 Hypothyroidism, unspecified: Secondary | ICD-10-CM | POA: Insufficient documentation

## 2013-02-05 DIAGNOSIS — F329 Major depressive disorder, single episode, unspecified: Secondary | ICD-10-CM | POA: Insufficient documentation

## 2013-02-05 DIAGNOSIS — R0789 Other chest pain: Secondary | ICD-10-CM | POA: Diagnosis not present

## 2013-02-05 DIAGNOSIS — K219 Gastro-esophageal reflux disease without esophagitis: Secondary | ICD-10-CM | POA: Diagnosis not present

## 2013-02-05 DIAGNOSIS — Z944 Liver transplant status: Secondary | ICD-10-CM | POA: Insufficient documentation

## 2013-02-05 DIAGNOSIS — I2510 Atherosclerotic heart disease of native coronary artery without angina pectoris: Secondary | ICD-10-CM | POA: Diagnosis not present

## 2013-02-05 DIAGNOSIS — F17200 Nicotine dependence, unspecified, uncomplicated: Secondary | ICD-10-CM | POA: Insufficient documentation

## 2013-02-05 DIAGNOSIS — N186 End stage renal disease: Secondary | ICD-10-CM | POA: Insufficient documentation

## 2013-02-05 DIAGNOSIS — D649 Anemia, unspecified: Secondary | ICD-10-CM | POA: Insufficient documentation

## 2013-02-05 DIAGNOSIS — Z8619 Personal history of other infectious and parasitic diseases: Secondary | ICD-10-CM | POA: Insufficient documentation

## 2013-02-05 DIAGNOSIS — E1122 Type 2 diabetes mellitus with diabetic chronic kidney disease: Secondary | ICD-10-CM | POA: Diagnosis present

## 2013-02-05 DIAGNOSIS — I120 Hypertensive chronic kidney disease with stage 5 chronic kidney disease or end stage renal disease: Secondary | ICD-10-CM | POA: Diagnosis not present

## 2013-02-05 DIAGNOSIS — M948X9 Other specified disorders of cartilage, unspecified sites: Secondary | ICD-10-CM | POA: Diagnosis not present

## 2013-02-05 DIAGNOSIS — R51 Headache: Secondary | ICD-10-CM | POA: Diagnosis not present

## 2013-02-05 DIAGNOSIS — Z992 Dependence on renal dialysis: Secondary | ICD-10-CM

## 2013-02-05 DIAGNOSIS — E119 Type 2 diabetes mellitus without complications: Secondary | ICD-10-CM | POA: Insufficient documentation

## 2013-02-05 DIAGNOSIS — I10 Essential (primary) hypertension: Secondary | ICD-10-CM

## 2013-02-05 DIAGNOSIS — J810 Acute pulmonary edema: Secondary | ICD-10-CM

## 2013-02-05 DIAGNOSIS — I953 Hypotension of hemodialysis: Secondary | ICD-10-CM | POA: Diagnosis not present

## 2013-02-05 DIAGNOSIS — F3289 Other specified depressive episodes: Secondary | ICD-10-CM | POA: Insufficient documentation

## 2013-02-05 DIAGNOSIS — Z79899 Other long term (current) drug therapy: Secondary | ICD-10-CM | POA: Diagnosis not present

## 2013-02-05 DIAGNOSIS — Z7982 Long term (current) use of aspirin: Secondary | ICD-10-CM | POA: Diagnosis not present

## 2013-02-05 DIAGNOSIS — I5023 Acute on chronic systolic (congestive) heart failure: Secondary | ICD-10-CM

## 2013-02-05 DIAGNOSIS — I509 Heart failure, unspecified: Secondary | ICD-10-CM | POA: Insufficient documentation

## 2013-02-05 DIAGNOSIS — I119 Hypertensive heart disease without heart failure: Secondary | ICD-10-CM | POA: Diagnosis present

## 2013-02-05 DIAGNOSIS — I5041 Acute combined systolic (congestive) and diastolic (congestive) heart failure: Secondary | ICD-10-CM | POA: Diagnosis present

## 2013-02-05 DIAGNOSIS — J9600 Acute respiratory failure, unspecified whether with hypoxia or hypercapnia: Secondary | ICD-10-CM

## 2013-02-05 DIAGNOSIS — J4489 Other specified chronic obstructive pulmonary disease: Secondary | ICD-10-CM | POA: Insufficient documentation

## 2013-02-05 LAB — POCT I-STAT TROPONIN I: Troponin i, poc: 0.02 ng/mL (ref 0.00–0.08)

## 2013-02-05 LAB — CBC WITH DIFFERENTIAL/PLATELET
Eosinophils Absolute: 0.1 10*3/uL (ref 0.0–0.7)
Eosinophils Relative: 2 % (ref 0–5)
HCT: 32.7 % — ABNORMAL LOW (ref 36.0–46.0)
Lymphs Abs: 1.3 10*3/uL (ref 0.7–4.0)
MCH: 32.7 pg (ref 26.0–34.0)
MCV: 95.3 fL (ref 78.0–100.0)
Monocytes Absolute: 0.6 10*3/uL (ref 0.1–1.0)
Monocytes Relative: 10 % (ref 3–12)
Platelets: 135 10*3/uL — ABNORMAL LOW (ref 150–400)
RBC: 3.43 MIL/uL — ABNORMAL LOW (ref 3.87–5.11)
WBC: 6 10*3/uL (ref 4.0–10.5)

## 2013-02-05 LAB — COMPREHENSIVE METABOLIC PANEL
BUN: 28 mg/dL — ABNORMAL HIGH (ref 6–23)
CO2: 27 mEq/L (ref 19–32)
Calcium: 8.6 mg/dL (ref 8.4–10.5)
GFR calc Af Amer: 9 mL/min — ABNORMAL LOW (ref >90)
Glucose, Bld: 261 mg/dL — ABNORMAL HIGH (ref 70–99)
Sodium: 137 mEq/L (ref 135–145)
Total Bilirubin: 0.3 mg/dL (ref 0.3–1.2)
Total Protein: 6.9 g/dL (ref 6.0–8.3)

## 2013-02-05 LAB — CREATININE, SERUM
Creatinine, Ser: 6.98 mg/dL — ABNORMAL HIGH (ref 0.50–1.10)
GFR calc non Af Amer: 6 mL/min — ABNORMAL LOW (ref >90)

## 2013-02-05 LAB — GLUCOSE, CAPILLARY: Glucose-Capillary: 93 mg/dL (ref 70–99)

## 2013-02-05 LAB — TROPONIN I
Troponin I: <0.3 ng/mL (ref <0.30)
Troponin I: <0.3 ng/mL (ref <0.30)

## 2013-02-05 LAB — CBC
MCH: 32 pg (ref 26.0–34.0)
MCHC: 33.1 g/dL (ref 30.0–36.0)
Platelets: 127 10*3/uL — ABNORMAL LOW (ref 150–400)
RBC: 3.72 MIL/uL — ABNORMAL LOW (ref 3.87–5.11)
RDW: 14.5 % (ref 11.5–15.5)

## 2013-02-05 MED ORDER — ADULT MULTIVITAMIN W/MINERALS CH
1.0000 | ORAL_TABLET | Freq: Every day | ORAL | Status: DC
  Filled 2013-02-05 (×2): qty 1

## 2013-02-05 MED ORDER — MORPHINE SULFATE 2 MG/ML IJ SOLN: 2.0000 mg | INTRAMUSCULAR | Status: DC | PRN

## 2013-02-05 MED ORDER — NICOTINE 7 MG/24HR TD PT24
7.0000 mg | MEDICATED_PATCH | Freq: Every day | TRANSDERMAL | Status: DC
  Administered 2013-02-06 – 2013-02-07 (×2): 7 mg via TRANSDERMAL
  Filled 2013-02-05 (×3): qty 1

## 2013-02-05 MED ORDER — ASPIRIN EC 81 MG PO TBEC
81.0000 mg | DELAYED_RELEASE_TABLET | Freq: Every day | ORAL | Status: DC
  Administered 2013-02-06 – 2013-02-07 (×2): 81 mg via ORAL
  Filled 2013-02-05 (×3): qty 1

## 2013-02-05 MED ORDER — TRAMADOL HCL 50 MG PO TABS
50.0000 mg | ORAL_TABLET | Freq: Four times a day (QID) | ORAL | Status: DC | PRN
  Administered 2013-02-05: 50 mg via ORAL
  Filled 2013-02-05: qty 1

## 2013-02-05 MED ORDER — SUCROFERRIC OXYHYDROXIDE 500 MG PO CHEW: 500.0000 mg | CHEWABLE_TABLET | Freq: Three times a day (TID) | ORAL | Status: DC

## 2013-02-05 MED ORDER — DM-GUAIFENESIN ER 30-600 MG PO TB12
1.0000 | ORAL_TABLET | Freq: Two times a day (BID) | ORAL | Status: DC
  Administered 2013-02-05 – 2013-02-07 (×4): 1 via ORAL
  Filled 2013-02-05 (×5): qty 1

## 2013-02-05 MED ORDER — MIDODRINE HCL 5 MG PO TABS
10.0000 mg | ORAL_TABLET | Freq: Every day | ORAL | Status: DC
Start: 2013-02-05 — End: 2013-02-06
  Administered 2013-02-05: 10 mg via ORAL
  Filled 2013-02-05 (×2): qty 2

## 2013-02-05 MED ORDER — ACETAMINOPHEN 325 MG PO TABS
650.0000 mg | ORAL_TABLET | ORAL | Status: DC | PRN
Start: 2013-02-05 — End: 2013-02-07

## 2013-02-05 MED ORDER — POLYETHYLENE GLYCOL 3350 17 G PO PACK
17.0000 g | PACK | Freq: Two times a day (BID) | ORAL | Status: DC
  Administered 2013-02-05 – 2013-02-07 (×4): 17 g via ORAL
  Filled 2013-02-05 (×5): qty 1

## 2013-02-05 MED ORDER — CEFUROXIME AXETIL 500 MG PO TABS
500.0000 mg | ORAL_TABLET | Freq: Two times a day (BID) | ORAL | Status: DC
  Administered 2013-02-05 – 2013-02-06 (×2): 500 mg via ORAL
  Filled 2013-02-05 (×3): qty 1

## 2013-02-05 MED ORDER — HEPARIN (PORCINE) IN NACL 100-0.45 UNIT/ML-% IJ SOLN
850.0000 [IU]/h | INTRAMUSCULAR | Status: DC
  Administered 2013-02-06: 03:00:00 850 [IU]/h via INTRAVENOUS
  Filled 2013-02-05 (×3): qty 250

## 2013-02-05 MED ORDER — PANTOPRAZOLE SODIUM 40 MG PO TBEC
40.0000 mg | DELAYED_RELEASE_TABLET | Freq: Every day | ORAL | Status: DC
  Administered 2013-02-06 – 2013-02-07 (×2): 40 mg via ORAL
  Filled 2013-02-05 (×2): qty 1

## 2013-02-05 MED ORDER — INSULIN ASPART 100 UNIT/ML ~~LOC~~ SOLN
0.0000 [IU] | Freq: Three times a day (TID) | SUBCUTANEOUS | Status: DC
  Administered 2013-02-06 – 2013-02-07 (×3): 1 [IU] via SUBCUTANEOUS

## 2013-02-05 MED ORDER — NITROGLYCERIN 0.4 MG SL SUBL: 0.4000 mg | SUBLINGUAL_TABLET | SUBLINGUAL | Status: DC | PRN

## 2013-02-05 MED ORDER — CALCIUM CARBONATE 1250 (500 CA) MG PO TABS
500.0000 mg | ORAL_TABLET | Freq: Every day | ORAL | Status: DC
  Administered 2013-02-06: 500 mg via ORAL
  Filled 2013-02-05 (×3): qty 1

## 2013-02-05 MED ORDER — LORAZEPAM 0.5 MG PO TABS: 0.5000 mg | ORAL_TABLET | Freq: Every day | ORAL | Status: DC | PRN

## 2013-02-05 MED ORDER — GABAPENTIN 300 MG PO CAPS
300.0000 mg | ORAL_CAPSULE | Freq: Every day | ORAL | Status: DC
  Administered 2013-02-05 – 2013-02-06 (×2): 300 mg via ORAL
  Filled 2013-02-05 (×3): qty 1

## 2013-02-05 MED ORDER — DOCUSATE SODIUM 100 MG PO CAPS
100.0000 mg | ORAL_CAPSULE | Freq: Two times a day (BID) | ORAL | Status: DC
  Administered 2013-02-05 – 2013-02-07 (×4): 100 mg via ORAL
  Filled 2013-02-05 (×5): qty 1

## 2013-02-05 MED ORDER — LEVOTHYROXINE SODIUM 150 MCG PO TABS
150.0000 ug | ORAL_TABLET | Freq: Every day | ORAL | Status: DC
  Administered 2013-02-07: 150 ug via ORAL
  Filled 2013-02-05 (×3): qty 1

## 2013-02-05 MED ORDER — CINACALCET HCL 30 MG PO TABS
30.0000 mg | ORAL_TABLET | Freq: Every day | ORAL | Status: DC
  Administered 2013-02-06: 09:00:00 30 mg via ORAL
  Filled 2013-02-05 (×4): qty 1

## 2013-02-05 MED ORDER — SUCRALFATE 1 G PO TABS
1.0000 g | ORAL_TABLET | Freq: Two times a day (BID) | ORAL | Status: DC
  Administered 2013-02-06 – 2013-02-07 (×3): 1 g via ORAL
  Filled 2013-02-05 (×5): qty 1

## 2013-02-05 MED ORDER — SENNA 8.6 MG PO TABS
1.0000 | ORAL_TABLET | Freq: Every evening | ORAL | Status: DC
  Administered 2013-02-06: 8.6 mg via ORAL
  Filled 2013-02-05 (×4): qty 1

## 2013-02-05 MED ORDER — DULOXETINE HCL 60 MG PO CPEP
60.0000 mg | ORAL_CAPSULE | Freq: Every day | ORAL | Status: DC
  Administered 2013-02-05 – 2013-02-07 (×3): 60 mg via ORAL
  Filled 2013-02-05 (×3): qty 1

## 2013-02-05 MED ORDER — NICOTINE 14 MG/24HR TD PT24
14.0000 mg | MEDICATED_PATCH | Freq: Every day | TRANSDERMAL | Status: DC
  Administered 2013-02-05 – 2013-02-06 (×2): 14 mg via TRANSDERMAL
  Filled 2013-02-05 (×3): qty 1

## 2013-02-05 MED ORDER — HEPARIN SODIUM (PORCINE) 5000 UNIT/ML IJ SOLN
5000.0000 [IU] | Freq: Three times a day (TID) | INTRAMUSCULAR | Status: DC
  Administered 2013-02-05: 5000 [IU] via SUBCUTANEOUS
  Filled 2013-02-05 (×3): qty 1

## 2013-02-05 MED ORDER — TRAMADOL HCL 50 MG PO TABS
50.0000 mg | ORAL_TABLET | Freq: Once | ORAL | Status: AC
  Administered 2013-02-05: 50 mg via ORAL
  Filled 2013-02-05: qty 1

## 2013-02-05 MED ORDER — PROMETHAZINE HCL 25 MG PO TABS
12.5000 mg | ORAL_TABLET | Freq: Four times a day (QID) | ORAL | Status: DC | PRN
  Administered 2013-02-06: 12.5 mg via ORAL
  Filled 2013-02-05 (×2): qty 1

## 2013-02-05 MED ORDER — TACROLIMUS 1 MG PO CAPS
5.0000 mg | ORAL_CAPSULE | Freq: Two times a day (BID) | ORAL | Status: DC
  Administered 2013-02-05 – 2013-02-07 (×4): 5 mg via ORAL
  Filled 2013-02-05 (×5): qty 5

## 2013-02-05 MED ORDER — DICYCLOMINE HCL 10 MG PO CAPS
10.0000 mg | ORAL_CAPSULE | Freq: Four times a day (QID) | ORAL | Status: DC | PRN
  Filled 2013-02-05: qty 1

## 2013-02-05 MED ORDER — ONDANSETRON HCL 4 MG/2ML IJ SOLN: 4.0000 mg | Freq: Four times a day (QID) | INTRAMUSCULAR | Status: DC | PRN

## 2013-02-05 MED ORDER — HEPARIN BOLUS VIA INFUSION
4000.0000 [IU] | Freq: Once | INTRAVENOUS | Status: DC
  Filled 2013-02-05: qty 4000

## 2013-02-05 MED ORDER — METOPROLOL TARTRATE 12.5 MG HALF TABLET
12.5000 mg | ORAL_TABLET | Freq: Two times a day (BID) | ORAL | Status: DC
  Administered 2013-02-05 – 2013-02-06 (×2): 12.5 mg via ORAL
  Filled 2013-02-05 (×3): qty 1

## 2013-02-05 MED ORDER — SEVELAMER CARBONATE 800 MG PO TABS
800.0000 mg | ORAL_TABLET | Freq: Three times a day (TID) | ORAL | Status: DC
  Administered 2013-02-06 (×2): 800 mg via ORAL
  Filled 2013-02-05 (×5): qty 1

## 2013-02-05 NOTE — ED Provider Notes (Signed)
Medical screening examination/treatment/procedure(s) were performed by non-physician practitioner and as supervising physician I was immediately available for consultation/collaboration.  EKG Interpretation    Date/Time:  Saturday February 05 2013 10:06:28 EST Ventricular Rate:  73 PR Interval:  148 QRS Duration: 93 QT Interval:  470 QTC Calculation: 518 R Axis:   4 Text Interpretation:  Sinus rhythm LVH with secondary repolarization abnormality Prolonged QT interval No significant change since last tracing Confirmed by Karma Ganja  MD, Janaa Acero [phone]) on 02/05/2013 5:01:18 PM             Ethelda Chick, MD 02/05/13 [phone]

## 2013-02-05 NOTE — ED Notes (Signed)
Patient was at her HD treatment this am and about 2.5 hours into her 4 hour treatment she started having chest pain low bp and fast heart rate. EMS gave 1 sl nitro tab. CBG with EMS 230. EMS attempted 1 PIV.

## 2013-02-05 NOTE — ED Provider Notes (Signed)
CSN: [id number]     Arrival date & time 02/05/13  0957 History   First MD Initiated Contact with Patient 02/05/13 1059     Chief Complaint  Patient presents with  . Chest Pain   (Consider location/radiation/quality/duration/timing/severity/associated sxs/prior Treatment) HPI Comments: [name] is a 57 year-old female with a past medical history of end stage renal disease, CAD, Aortic stenosis, Hepatitis s/p liver transplant, DM, presenting the Emergency Department with a chief complaint of chest discomfort.  She reports onset of discomfort while she was being dialyzed. She reports the non radiating discomfort as a 10+/10 tightness lasting for 1 hour.  She reports discomfort resolved  with Nitro.  She reports mild headache. Denies cough fever or chills. Dialysis schedule: Tues/Thur/Sat, last dialysis was Wednesday.    The history is provided by the patient and medical records. No language interpreter was used.    Past Medical History  Diagnosis Date  . Liver disease     cirrhosis and this is why she had the transplant in 2011  . Chronic back pain   . Diverticulosis   . CAD (coronary artery disease)   . Obesity   . Peripheral vascular disease hands and legs  . Anxiety   . Asthma   . GERD (gastroesophageal reflux disease)     takes Omeprazole daily  . Chronic constipation     takes MIralax and Colace daily  . Anemia     takes Folic Acid daily  . Hypothyroidism     takes Synthroid daily  . Depression     takes Cymbalta for "severe" depression  . Neuromuscular disorder     carpal tunnel in right hand  . Hypertension     takes Metoprolol and Lisinopril daily, sees Dr Shary Decamp  . COPD (chronic obstructive pulmonary disease)   . Pneumonia     "today and several times before" (08/30/2012)  . Chronic bronchitis     "q yr w/season changes" (08/30/2012)  . Shortness of breath     "lying down and w/exertion; this past year" (08/30/2012)  . Type II diabetes mellitus     Levemir  2units daily if > 150  . History of blood transfusion     "several" (08/30/2012)  . Cirrhosis of liver due to hepatitis C     "from blood transfusion back in 1983" (08/30/2012)  . Hepatitis C   . Migraine     "last migraine was in 2013" (08/30/2012)  . Headache     "at least monthly" (08/30/2012)  . Arthritis     "left hand, back" (08/30/2012)  . End stage renal disease on dialysis 02/27/2011    Kidneys shut down at time of liver transplant in Sept 2011 at [facility] in [place], she has been on HD ever since.  Dialyzes at [facility] HD on TTS schedule.  Had L forearm graft used 10 months then removed Dec 2012 due to suspected infection.  A right upper arm AVG was placed Dec 2012 but she developed steal symptoms acutely and it was ligated the same day.  Never had an AV fistula due to small veins.  Now has L thigh AVG put in Jan 2013, has not clotted to date.    [name] ESRD (end stage renal disease)     "TTS; [facility], [place]" (08/30/2012)   Past Surgical History  Procedure Laterality Date  . Liver transplant  10/25/2009    sees Dr Barnetta Hammersmith 1 every 6 months, saw last in Dec 2013. Odette Horns  Coord [phone]  . Small intestine surgery  90's  . Thrombectomy    . Arteriovenous graft placement Left 10/03/10     forearm  . Avgg removal  12/23/2010    Procedure: REMOVAL OF ARTERIOVENOUS GORETEX GRAFT (AVGG);  Surgeon: Sherren Kerns, MD;  Location: [facility] OR;  Service: Vascular;  Laterality: Left;  procedure started @1736 -1852  . Insertion of dialysis catheter  12/23/2010    Procedure: INSERTION OF DIALYSIS CATHETER;  Surgeon: Sherren Kerns, MD;  Location: [facility] OR;  Service: Vascular;  Laterality: Right;  Right Internal Jugular 28cm dialysis catheter insertion procedure time 1701-1720   . Cholecystectomy  1993  . Cystoscopy  1990's  . Spinal growth rods  2010    "put 2 metal rods in my back; they had detetriorated" (08/30/2012)  . Av fistula placement  01/29/2011    Procedure: INSERTION OF  ARTERIOVENOUS (AV) GORE-TEX GRAFT ARM;  Surgeon: Sherren Kerns, MD;  Location: [facility] OR;  Service: Vascular;  Laterality: Right;  . Av fistula placement  03/10/2011    Procedure: INSERTION OF ARTERIOVENOUS (AV) GORE-TEX GRAFT THIGH;  Surgeon: Sherren Kerns, MD;  Location: [facility] OR;  Service: Vascular;  Laterality: Left;  . Tubal ligation  1990's  . Cardiac catheterization  2014   Family History  Problem Relation Age of Onset  . Cancer Mother   . Diabetes Mother   . Hypertension Mother   . Stroke Mother   . Cancer Father   . Anesthesia problems Neg Hx   . Hypotension Neg Hx   . Malignant hyperthermia Neg Hx   . Pseudochol deficiency Neg Hx    History  Substance Use Topics  . Smoking status: Current Every Day Smoker -- 0.75 packs/day for 40 years    Types: Cigarettes  . Smokeless tobacco: Never Used  . Alcohol Use: Yes     Comment: 08/30/2012 "last drink was at a wedding July, 2014; had a small glass of wine; never had problems w/alcohol"   OB History   Grav Para Term Preterm Abortions TAB SAB Ect Mult Living                 Review of Systems  Constitutional: Negative for fever and chills.  Eyes: Positive for visual disturbance.  Respiratory: Positive for chest tightness and shortness of breath. Negative for cough.   Cardiovascular: Negative for palpitations and leg swelling.  Gastrointestinal: Negative for nausea, vomiting, abdominal pain and diarrhea.  Neurological: Positive for light-headedness and headaches. Negative for numbness.    Allergies  Acetaminophen and Codeine  Home Medications   Current Outpatient Rx  Name  Route  Sig  Dispense  Refill  . albuterol (PROVENTIL,VENTOLIN) 90 MCG/ACT inhaler   Inhalation   Inhale 2 puffs into the lungs every 6 (six) hours as needed for wheezing. For shortness of breath         . aspirin EC 81 MG tablet   Oral   Take 81 mg by mouth daily.           [name] CALCIUM CARBONATE PO   Oral   Take 500 mg by mouth daily.          . cefdinir (OMNICEF) 300 MG capsule   Oral   Take 300 mg by mouth 2 (two) times daily.         . cinacalcet (SENSIPAR) 30 MG tablet   Oral   Take 30 mg by mouth daily.         [name]  dextromethorphan-guaiFENesin (MUCINEX DM) 30-600 MG per 12 hr tablet   Oral   Take 1 tablet by mouth 2 (two) times daily.         [name] dicyclomine (BENTYL) 10 MG capsule   Oral   Take 10 mg by mouth every 6 (six) hours as needed (for stomach pain).         [name] docusate sodium (COLACE) 100 MG capsule   Oral   Take 100 mg by mouth 2 (two) times daily.           . DULoxetine (CYMBALTA) 60 MG capsule   Oral   Take 60 mg by mouth daily.           [name] gabapentin (NEURONTIN) 300 MG capsule   Oral   Take 300 mg by mouth at bedtime.          . insulin detemir (LEVEMIR) 100 UNIT/ML injection   Subcutaneous   Inject 5 Units into the skin at bedtime as needed. Uses only if blood sugar levels are over 150         . levothyroxine (SYNTHROID, LEVOTHROID) 150 MCG tablet   Oral   Take 150 mcg by mouth daily before breakfast.         . LORazepam (ATIVAN) 0.5 MG tablet   Oral   Take 0.5 mg by mouth daily as needed for anxiety.         . metoprolol tartrate (LOPRESSOR) 25 MG tablet   Oral   Take 12.5 mg by mouth 2 (two) times daily.         . midodrine (PROAMATINE) 10 MG tablet   Oral   Take 10 mg by mouth daily.         . Multiple Vitamin (MULTIVITAMIN WITH MINERALS) TABS tablet   Oral   Take 1 tablet by mouth daily.         . nicotine (NICODERM CQ - DOSED IN MG/24 HR) 7 mg/24hr patch   Transdermal   Place 7 mg onto the skin daily.         . nitroGLYCERIN (NITROSTAT) 0.4 MG SL tablet   Sublingual   Place 0.4 mg under the tongue every 5 (five) minutes as needed for chest pain.         [name] omeprazole (PRILOSEC) 20 MG capsule   Oral   Take 20 mg by mouth daily.         . polyethylene glycol (MIRALAX / GLYCOLAX) packet   Oral   Take 17 g by mouth 2 (two) times daily.           . promethazine (PHENERGAN) 12.5 MG tablet   Oral   Take 12.5 mg by mouth every 6 (six) hours as needed for nausea or vomiting.         . senna (SENOKOT) 8.6 MG TABS tablet   Oral   Take 1 tablet by mouth every evening.         . sevelamer carbonate (RENVELA) 800 MG tablet   Oral   Take 800 mg by mouth 3 (three) times daily with meals.         . sucralfate (CARAFATE) 1 G tablet   Oral   Take 1 g by mouth 2 (two) times daily.          . Sucroferric Oxyhydroxide (VELPHORO) 500 MG CHEW   Oral   Chew 500 mg by mouth 3 (three) times daily.         [name]  tacrolimus (PROGRAF) 1 MG capsule   Oral   Take 5 mg by mouth 2 (two) times daily.         . traMADol (ULTRAM) 50 MG tablet   Oral   Take by mouth every 6 (six) hours as needed.          BP 125/62  Pulse 66  Temp(Src) 97.8 F (36.6 C) (Oral)  Resp 16  SpO2 100% Physical Exam  Nursing note and vitals reviewed. Constitutional: She is oriented to person, place, and time. She appears well-developed and well-nourished. No distress.  HENT:  Head: Normocephalic and atraumatic.  Mouth/Throat: No oropharyngeal exudate.  Eyes: EOM are normal. Pupils are equal, round, and reactive to light. Right conjunctiva is not injected. Left conjunctiva is not injected. Scleral icterus is present.  Perferal field of vision intact.  Neck: Neck supple.  Cardiovascular: Normal rate and regular rhythm.   Murmur heard. No lower extremity edema  Pulmonary/Chest: Effort normal and breath sounds normal. She has no wheezes. She has no rales.  Abdominal: Soft. Bowel sounds are normal. There is no tenderness.  Neurological: She is alert and oriented to person, place, and time. No cranial nerve deficit or sensory deficit.  Reflex Scores:      Bicep reflexes are 2+ on the right side and 2+ on the left side.      Patellar reflexes are 2+ on the right side and 2+ on the left side. Skin: Skin is warm and dry.  Psychiatric:  [place]    ED  Course  Procedures (including critical care time) Labs Review Labs Reviewed  CBC WITH DIFFERENTIAL - Abnormal; Notable for the following:    RBC 3.43 (*)    Hemoglobin 11.2 (*)    HCT 32.7 (*)    Platelets 135 (*)    All other components within normal limits  COMPREHENSIVE METABOLIC PANEL - Abnormal; Notable for the following:    Glucose, Bld 261 (*)    BUN 28 (*)    Creatinine, Ser 5.74 (*)    Albumin 3.0 (*)    GFR calc non Af Amer 7 (*)    GFR calc Af Amer 9 (*)    All other components within normal limits  TROPONIN I  POCT I-STAT TROPONIN I   Imaging Review Dg Chest 2 View  02/05/2013   CLINICAL DATA:  Chest pain and pressure and nausea following acute dialysis today. The patient is a history of coronary artery disease and COPD.  EXAM: CHEST  2 VIEW  COMPARISON:  Portable chest x-ray of November 03, 2012.  FINDINGS: The lungs are adequately inflated. There is no focal infiltrate. There is minimal prominence of the pulmonary interstitial markings but these findings are much improved over the study of November 03, 2012. The cardiopericardial silhouette is normal in size. The pulmonary vascularity is not engorged. There is no pleural effusion or pneumothorax. The observed portions of the bony thorax appear normal.  IMPRESSION: There is no evidence of pneumonia nor CHF nor other acute cardiopulmonary abnormality.   Electronically Signed   By: David  [place]   On: 02/05/2013 12:31   Ct Head Wo Contrast  02/05/2013   CLINICAL DATA:  Headache  EXAM: CT HEAD WITHOUT CONTRAST  TECHNIQUE: Contiguous axial images were obtained from the base of the skull through the vertex without intravenous contrast.  COMPARISON:  06/15/2012  FINDINGS: No mass effect, midline shift, or acute intracranial hemorrhage. Minimal chronic ischemic changes in the periventricular white matter.  IMPRESSION:  No acute intracranial pathology.   Electronically Signed   By: Maryclare Bean M.D.   On: 02/05/2013 13:14    EKG  Interpretation    Date/Time:  Saturday February 05 2013 10:06:28 EST Ventricular Rate:  73 PR Interval:  148 QRS Duration: 93 QT Interval:  470 QTC Calculation: 518 R Axis:   4 Text Interpretation:  Sinus rhythm LVH with secondary repolarization abnormality Prolonged QT interval No significant change since last tracing Confirmed by Karma Ganja  MD, MARTHA [phone]) on 02/05/2013 5:01:18 PM            MDM   1. Chest pain   2. End stage renal disease    Pt with an episode of chest discomfort resolved with nitro.  Will rule out ACS. Pt complains of a headache and drowsy on exam and will obtain a CT to rule out CVA.  Labs sent. Troponin negative, Cr stable, K-3.5 normal. Re-eval: Pt reports she is more alert. Reports another episode of discomfort upon standing up resolved with sitting.  Discussed patient history, condition, and labs with Dr. Karma Ganja who agrees the patient should be admitted for observation. CT-negative for acute process.  Delta troponin ordered. Consult to Dr. Burtis Junes who agrees to admit the patient for observation. Discussed lab results, imaging results, and treatment plan with the patient. Reports understanding and no other concerns at this time.      Meds given in ED:  Medications  nicotine (NICODERM CQ - dosed in mg/24 hours) patch 14 mg (14 mg Transdermal Patch Applied 02/05/13 1627)  traMADol (ULTRAM) tablet 50 mg (50 mg Oral Given 02/05/13 1626)    New Prescriptions   No medications on file        Clabe Seal, PA-C 02/05/13 1710

## 2013-02-05 NOTE — ED Notes (Signed)
IV team called to de access right thigh fistula.

## 2013-02-05 NOTE — H&P (Signed)
Date: 02/05/2013               Patient Name:  [name] MRN: [id number]  DOB: 1955-09-07 Age / Sex: 57 y.o., female   PCP: Hal Morales, NP         Medical Service: Internal Medicine Teaching Service         Attending Physician: Dr. Burns Spain, MD    First Contact: Dr. Darci Needle Pager: [phone]  Second Contact: Dr. Virgina Organ Pager: [phone]       After Hours (After 5p/  First Contact Pager: [phone]  weekends / holidays): Second Contact Pager: [phone]   Chief Complaint: chest pain   History of Present Illness:  Ms. Dileo is a 57 year old woman with PMH of hepatitis C, cirrhosis 2/2 EtOH abuse s/p liver transplant in 2011, ESRD on HD TTS ([facility] in [place]), DM2, HTN, CAD, GERD, COPD, hypothyroidism who presents with chest pain.   Patient states that 2.5-3 hours into her 4 hour HD session this morning, she developed left sided chest pain/pressure associated with nausea, shortness of breath, and "clamminess."  Pain was 10/10, non-radiating (though she did report pain in her right arm), constant x 4 hours, exertional, relieved with NTG in ambulance.  Chest pain now completely resolved.  She reports recent history of chest pain with ambulation, usually lasting 30 minutes and relieved with NTG but today's episode was "much worse."  She has a history of CAD with catheterization in 07/2012 (Dr. Tresa Endo) which showed 40-45% stenosis of LAD.  Echo in 07/2012 showed EF 40-45% with reduced systolic function, grade 2 diastolic dysfunction, and moderate aortic stenosis.   On ROS, she also reports headache and fatigue.  She takes tramadol at home for pain (no Tylenol, NSAIDs given liver transplant and ESRD).   In ED, patient had negative troponin x 2.  EKG with no changes from prior.  CXR negative. CT head (given headache and "drowziness") with no acute intracranial pathology.    Meds: No current facility-administered medications for this encounter.   Current Outpatient Prescriptions   Medication Sig Dispense Refill  . albuterol (PROVENTIL,VENTOLIN) 90 MCG/ACT inhaler Inhale 2 puffs into the lungs every 6 (six) hours as needed for wheezing. For shortness of breath      . aspirin EC 81 MG tablet Take 81 mg by mouth daily.        [name] CALCIUM CARBONATE PO Take 500 mg by mouth daily.      . cefdinir (OMNICEF) 300 MG capsule Take 300 mg by mouth 2 (two) times daily.      . cinacalcet (SENSIPAR) 30 MG tablet Take 30 mg by mouth daily.      [name] dextromethorphan-guaiFENesin (MUCINEX DM) 30-600 MG per 12 hr tablet Take 1 tablet by mouth 2 (two) times daily.      [name] dicyclomine (BENTYL) 10 MG capsule Take 10 mg by mouth every 6 (six) hours as needed (for stomach pain).      [name] docusate sodium (COLACE) 100 MG capsule Take 100 mg by mouth 2 (two) times daily.        . DULoxetine (CYMBALTA) 60 MG capsule Take 60 mg by mouth daily.        [name] gabapentin (NEURONTIN) 300 MG capsule Take 300 mg by mouth at bedtime.       . insulin detemir (LEVEMIR) 100 UNIT/ML injection Inject 5 Units into the skin at bedtime as needed. Uses only if blood sugar levels are over 150      .  levothyroxine (SYNTHROID, LEVOTHROID) 150 MCG tablet Take 150 mcg by mouth daily before breakfast.      . LORazepam (ATIVAN) 0.5 MG tablet Take 0.5 mg by mouth daily as needed for anxiety.      . metoprolol tartrate (LOPRESSOR) 25 MG tablet Take 12.5 mg by mouth 2 (two) times daily.      . midodrine (PROAMATINE) 10 MG tablet Take 10 mg by mouth daily.      . Multiple Vitamin (MULTIVITAMIN WITH MINERALS) TABS tablet Take 1 tablet by mouth daily.      . nicotine (NICODERM CQ - DOSED IN MG/24 HR) 7 mg/24hr patch Place 7 mg onto the skin daily.      . nitroGLYCERIN (NITROSTAT) 0.4 MG SL tablet Place 0.4 mg under the tongue every 5 (five) minutes as needed for chest pain.      [name] omeprazole (PRILOSEC) 20 MG capsule Take 20 mg by mouth daily.      . polyethylene glycol (MIRALAX / GLYCOLAX) packet Take 17 g by mouth 2 (two) times daily.        . promethazine (PHENERGAN) 12.5 MG tablet Take 12.5 mg by mouth every 6 (six) hours as needed for nausea or vomiting.      . senna (SENOKOT) 8.6 MG TABS tablet Take 1 tablet by mouth every evening.      . sevelamer carbonate (RENVELA) 800 MG tablet Take 800 mg by mouth 3 (three) times daily with meals.      . sucralfate (CARAFATE) 1 G tablet Take 1 g by mouth 2 (two) times daily.       . Sucroferric Oxyhydroxide (VELPHORO) 500 MG CHEW Chew 500 mg by mouth 3 (three) times daily.      . tacrolimus (PROGRAF) 1 MG capsule Take 5 mg by mouth 2 (two) times daily.      . traMADol (ULTRAM) 50 MG tablet Take by mouth every 6 (six) hours as needed.       Medications Prior to Admission  Medication Sig Dispense Refill  . albuterol (PROVENTIL,VENTOLIN) 90 MCG/ACT inhaler Inhale 2 puffs into the lungs every 6 (six) hours as needed for wheezing. For shortness of breath      . aspirin EC 81 MG tablet Take 81 mg by mouth daily.        [name] CALCIUM CARBONATE PO Take 500 mg by mouth daily.      . cefdinir (OMNICEF) 300 MG capsule Take 300 mg by mouth 2 (two) times daily.      . cinacalcet (SENSIPAR) 30 MG tablet Take 30 mg by mouth daily.      [name] dextromethorphan-guaiFENesin (MUCINEX DM) 30-600 MG per 12 hr tablet Take 1 tablet by mouth 2 (two) times daily.      [name] dicyclomine (BENTYL) 10 MG capsule Take 10 mg by mouth every 6 (six) hours as needed (for stomach pain).      [name] docusate sodium (COLACE) 100 MG capsule Take 100 mg by mouth 2 (two) times daily.        . DULoxetine (CYMBALTA) 60 MG capsule Take 60 mg by mouth daily.        [name] gabapentin (NEURONTIN) 300 MG capsule Take 300 mg by mouth at bedtime.       . insulin detemir (LEVEMIR) 100 UNIT/ML injection Inject 5 Units into the skin at bedtime as needed. Uses only if blood sugar levels are over 150      . levothyroxine (SYNTHROID, LEVOTHROID) 150 MCG tablet Take 150 mcg  by mouth daily before breakfast.      . LORazepam (ATIVAN) 0.5 MG tablet Take 0.5 mg by  mouth daily as needed for anxiety.      . metoprolol tartrate (LOPRESSOR) 25 MG tablet Take 12.5 mg by mouth 2 (two) times daily.      . midodrine (PROAMATINE) 10 MG tablet Take 10 mg by mouth daily.      . Multiple Vitamin (MULTIVITAMIN WITH MINERALS) TABS tablet Take 1 tablet by mouth daily.      . nicotine (NICODERM CQ - DOSED IN MG/24 HR) 7 mg/24hr patch Place 7 mg onto the skin daily.      . nitroGLYCERIN (NITROSTAT) 0.4 MG SL tablet Place 0.4 mg under the tongue every 5 (five) minutes as needed for chest pain.      [name] omeprazole (PRILOSEC) 20 MG capsule Take 20 mg by mouth daily.      . polyethylene glycol (MIRALAX / GLYCOLAX) packet Take 17 g by mouth 2 (two) times daily.       . promethazine (PHENERGAN) 12.5 MG tablet Take 12.5 mg by mouth every 6 (six) hours as needed for nausea or vomiting.      . senna (SENOKOT) 8.6 MG TABS tablet Take 1 tablet by mouth every evening.      . sevelamer carbonate (RENVELA) 800 MG tablet Take 800 mg by mouth 3 (three) times daily with meals.      . sucralfate (CARAFATE) 1 G tablet Take 1 g by mouth 2 (two) times daily.       . Sucroferric Oxyhydroxide (VELPHORO) 500 MG CHEW Chew 500 mg by mouth 3 (three) times daily.      . tacrolimus (PROGRAF) 1 MG capsule Take 5 mg by mouth 2 (two) times daily.      . traMADol (ULTRAM) 50 MG tablet Take by mouth every 6 (six) hours as needed.        Allergies: Allergies as of 02/05/2013 - Review Complete 02/05/2013  Allergen Reaction Noted  . Acetaminophen Other (See Comments)   . Codeine Itching    Past Medical History  Diagnosis Date  . Liver disease     cirrhosis and this is why she had the transplant in 2011  . Chronic back pain   . Diverticulosis   . CAD (coronary artery disease)   . Obesity   . Peripheral vascular disease hands and legs  . Anxiety   . Asthma   . GERD (gastroesophageal reflux disease)     takes Omeprazole daily  . Chronic constipation     takes MIralax and Colace daily  . Anemia       takes Folic Acid daily  . Hypothyroidism     takes Synthroid daily  . Depression     takes Cymbalta for "severe" depression  . Neuromuscular disorder     carpal tunnel in right hand  . Hypertension     takes Metoprolol and Lisinopril daily, sees Dr Shary Decamp  . COPD (chronic obstructive pulmonary disease)   . Pneumonia     "today and several times before" (08/30/2012)  . Chronic bronchitis     "q yr w/season changes" (08/30/2012)  . Shortness of breath     "lying down and w/exertion; this past year" (08/30/2012)  . Type II diabetes mellitus     Levemir 2units daily if > 150  . History of blood transfusion     "several" (08/30/2012)  . Cirrhosis of liver due to hepatitis C     "  from blood transfusion back in 1983" (08/30/2012)  . Hepatitis C   . Migraine     "last migraine was in 2013" (08/30/2012)  . Headache     "at least monthly" (08/30/2012)  . Arthritis     "left hand, back" (08/30/2012)  . End stage renal disease on dialysis 02/27/2011    Kidneys shut down at time of liver transplant in Sept 2011 at [facility] in [place], she has been on HD ever since.  Dialyzes at [facility] HD on TTS schedule.  Had L forearm graft used 10 months then removed Dec 2012 due to suspected infection.  A right upper arm AVG was placed Dec 2012 but she developed steal symptoms acutely and it was ligated the same day.  Never had an AV fistula due to small veins.  Now has L thigh AVG put in Jan 2013, has not clotted to date.    [name] ESRD (end stage renal disease)     "TTS; [facility] Provencal, [place]" (08/30/2012)   Past Surgical History  Procedure Laterality Date  . Liver transplant  10/25/2009    sees Dr Barnetta Hammersmith 1 every 6 months, saw last in Dec 2013. Odette Horns Coord [phone]  . Small intestine surgery  90's  . Thrombectomy    . Arteriovenous graft placement Left 10/03/10     forearm  . Avgg removal  12/23/2010    Procedure: REMOVAL OF ARTERIOVENOUS GORETEX GRAFT (AVGG);  Surgeon: Sherren Kerns, MD;   Location[facility] OR;  Service: Vascular;  Laterality: Left;  procedure started @1736 -1852  . Insertion of dialysis catheter  12/23/2010    Procedure: INSERTION OF DIALYSIS CATHETER;  Surgeon: Sherren Kerns, MD;  Location: [facility] OR;  Service: Vascular;  Laterality: Right;  Right Internal Jugular 28cm dialysis catheter insertion procedure time 1701-1720   . Cholecystectomy  1993  . Cystoscopy  1990's  . Spinal growth rods  2010    "put 2 metal rods in my back; they had detetriorated" (08/30/2012)  . Av fistula placement  01/29/2011    Procedure: INSERTION OF ARTERIOVENOUS (AV) GORE-TEX GRAFT ARM;  Surgeon: Sherren Kerns, MD;  Location: [facility] OR;  Service: Vascular;  Laterality: Right;  . Av fistula placement  03/10/2011    Procedure: INSERTION OF ARTERIOVENOUS (AV) GORE-TEX GRAFT THIGH;  Surgeon: Sherren Kerns, MD;  Location: [facility] OR;  Service: Vascular;  Laterality: Left;  . Tubal ligation  1990's  . Cardiac catheterization  2014   Family History  Problem Relation Age of Onset  . Cancer Mother   . Diabetes Mother   . Hypertension Mother   . Stroke Mother   . Cancer Father   . Anesthesia problems Neg Hx   . Hypotension Neg Hx   . Malignant hyperthermia Neg Hx   . Pseudochol deficiency Neg Hx    History   Social History  . Marital Status: Divorced    Spouse Name: N/A    Number of Children: N/A  . Years of Education: N/A   Occupational History  . Not on file.   Social History Main Topics  . Smoking status: Current Every Day Smoker -- 0.75 packs/day for 40 years    Types: Cigarettes  . Smokeless tobacco: Never Used  . Alcohol Use: Yes     Comment: 08/30/2012 "last drink was at a wedding July, 2014; had a small glass of wine; never had problems w/alcohol"  . Drug Use: No  . Sexual Activity: Not Currently   Other Topics Concern  .  Not on file   Social History Narrative  . No narrative on file  Lives at home alone. Disabled due to back pain.  Review of Systems: Review of  Systems  Constitutional: Positive for malaise/fatigue and diaphoresis. Negative for fever and chills.  Eyes: Negative for blurred vision.  Respiratory: Positive for shortness of breath. Negative for cough.   Cardiovascular: Positive for chest pain and palpitations. Negative for leg swelling.  Gastrointestinal: Positive for nausea. Negative for vomiting, abdominal pain, diarrhea, constipation and blood in stool.  Genitourinary: Negative for dysuria.  Musculoskeletal: Negative for falls.  Neurological: Positive for headaches. Negative for dizziness, tingling and loss of consciousness.   Physical Exam: Blood pressure 147/72, pulse 72, temperature 97.8 F (36.6 C), temperature source Oral, resp. rate 12, SpO2 100.00%. General: alert, cooperative, NAD HEENT: NCAT, no scleral icterus, oropharynx clear and non-erythematous  Neck: supple, no lymphadenopathy Lungs: clear to ascultation bilaterally, normal work of respiration, no wheezes, rales, ronchi Heart: 2-3/6 crescendo-descresendo murmur c/w AS best heard at LUSB, regular rate and rhythm, no gallops, or rubs Abdomen: soft, non-tender, non-distended, normal bowel sounds Extremities: LLE fistula with bruit and palpable thrill, 2+ DP/PT pulses bilaterally, no cyanosis, clubbing, or edema Neurologic: alert & oriented X3, cranial nerves II-XII intact, strength grossly intact, sensation intact to light touch   Lab results: Basic Metabolic Panel:  Recent Labs  40/98/11 1101  NA 137  K 3.5  CL 96  CO2 27  GLUCOSE 261*  BUN 28*  CREATININE 5.74*  CALCIUM 8.6   Liver Function Tests:  Recent Labs  02/05/13 1101  AST 26  ALT 15  ALKPHOS 70  BILITOT 0.3  PROT 6.9  ALBUMIN 3.0*   CBC:  Recent Labs  02/05/13 1101  WBC 6.0  NEUTROABS 4.0  HGB 11.2*  HCT 32.7*  MCV 95.3  PLT 135*    Imaging results:  Dg Chest 2 View  02/05/2013   CLINICAL DATA:  Chest pain and pressure and nausea following acute dialysis today. The  patient is a history of coronary artery disease and COPD.  EXAM: CHEST  2 VIEW  COMPARISON:  Portable chest x-ray of November 03, 2012.  FINDINGS: The lungs are adequately inflated. There is no focal infiltrate. There is minimal prominence of the pulmonary interstitial markings but these findings are much improved over the study of November 03, 2012. The cardiopericardial silhouette is normal in size. The pulmonary vascularity is not engorged. There is no pleural effusion or pneumothorax. The observed portions of the bony thorax appear normal.  IMPRESSION: There is no evidence of pneumonia nor CHF nor other acute cardiopulmonary abnormality.   Electronically Signed   By: David  [place]   On: 02/05/2013 12:31   Ct Head Wo Contrast  02/05/2013   CLINICAL DATA:  Headache  EXAM: CT HEAD WITHOUT CONTRAST  TECHNIQUE: Contiguous axial images were obtained from the base of the skull through the vertex without intravenous contrast.  COMPARISON:  06/15/2012  FINDINGS: No mass effect, midline shift, or acute intracranial hemorrhage. Minimal chronic ischemic changes in the periventricular white matter.  IMPRESSION: No acute intracranial pathology.   Electronically Signed   By: Maryclare Bean M.D.   On: 02/05/2013 13:14    Other results: EKG: sinus rhythm, normal axis, QT prolongation, LVH, no ST or T wave changes  Assessment & Plan by Problem: #Chest pain- Possbily atypical chest pain but concern for CAD given left sided chest pressure with associated nausea, shortness of breath, diaphoresis that lasted longer  and was more severe than previous episodes of exertional chest pain.  TIMI score of 2 (8% risk at 14 days of all cause mortality, MI, severe ischemia). History of CAD with recent cardiac catheterization in 07/2012 which showed LAD 40-45% stenosis.  Troponin x 2 negative.  -admit to telemetry  -cardiology consult (Dr. Allyson Sabal), appreciate recs -heparin gtt per cardiology recs; cardiology to evaluate in AM -cycle  troponins -continue ASA 81 mg daily -echo in AM  -BMP, CBC in AM -repeat EKG in AM  #ESRD on HD- left today's 12/27 session early due to chest pani.  States that she does not miss any sessions and always stays full amount of time. K 3.5 on admission.   -patient will likely be able to simply resume her usual schedule after discharge -renal diet  #S/p liver transplant- in 2011.  History of hepatitis C and cirrhosis 2/2 EtOH abuse.  On tacrolimus -tacrolimus level pending  #DM2- Last A1C 6.6% in 08/2012. On gabapentin 300 daily qhs.  -CBGs q4h   #HTN- BP 140s/70s, stable. On metoprolol 12.5 mg BID, continue while inpatient.   #Hypothyroidism- Continue Synthroid 150 mcg daily.   #DVT PPX- on heparin gtt per above  #Code status- DNR (but does want intubation)   Dispo: Disposition is deferred at this time, awaiting improvement of current medical problems. Anticipated discharge in approximately 1-2 day(s).   The patient does have a current PCP Hal Morales, NP) and does need an [facility] hospital follow-up appointment after discharge.   Signed: Rocco Serene, MD 02/05/2013, 2:30 PM

## 2013-02-05 NOTE — Progress Notes (Signed)
ANTICOAGULATION CONSULT NOTE - Initial Consult  Pharmacy Consult for heparin Indication: chest pain/ACS  Allergies  Allergen Reactions  . Acetaminophen Other (See Comments)    Liver problems  . Codeine Itching    Patient Measurements: weight 73 kg , height 64 inches   Heparin Dosing Weight: 69 kg  Vital Signs: Temp: 97.8 F (36.6 C) (12/27 1008) Temp src: Oral (12/27 1008) BP: 127/70 mmHg (12/27 1625) Pulse Rate: 66 (12/27 1625)  Labs:  Recent Labs  02/05/13 1101 02/05/13 1349  HGB 11.2*  --   HCT 32.7*  --   PLT 135*  --   CREATININE 5.74*  --   TROPONINI  --  <0.30    The CrCl is unknown because both a height and weight (above a minimum accepted value) are required for this calculation.   Medical History: Past Medical History  Diagnosis Date  . Liver disease     cirrhosis and this is why she had the transplant in 2011  . Chronic back pain   . Diverticulosis   . CAD (coronary artery disease)   . Obesity   . Peripheral vascular disease hands and legs  . Anxiety   . Asthma   . GERD (gastroesophageal reflux disease)     takes Omeprazole daily  . Chronic constipation     takes MIralax and Colace daily  . Anemia     takes Folic Acid daily  . Hypothyroidism     takes Synthroid daily  . Depression     takes Cymbalta for "severe" depression  . Neuromuscular disorder     carpal tunnel in right hand  . Hypertension     takes Metoprolol and Lisinopril daily, sees Dr Shary Decamp  . COPD (chronic obstructive pulmonary disease)   . Pneumonia     "today and several times before" (08/30/2012)  . Chronic bronchitis     "q yr w/season changes" (08/30/2012)  . Shortness of breath     "lying down and w/exertion; this past year" (08/30/2012)  . Type II diabetes mellitus     Levemir 2units daily if > 150  . History of blood transfusion     "several" (08/30/2012)  . Cirrhosis of liver due to hepatitis C     "from blood transfusion back in 1983" (08/30/2012)  . Hepatitis  C   . Migraine     "last migraine was in 2013" (08/30/2012)  . Headache     "at least monthly" (08/30/2012)  . Arthritis     "left hand, back" (08/30/2012)  . End stage renal disease on dialysis 02/27/2011    Kidneys shut down at time of liver transplant in Sept 2011 at [facility] in [place], she has been on HD ever since.  Dialyzes at [facility] HD on TTS schedule.  Had L forearm graft used 10 months then removed Dec 2012 due to suspected infection.  A right upper arm AVG was placed Dec 2012 but she developed steal symptoms acutely and it was ligated the same day.  Never had an AV fistula due to small veins.  Now has L thigh AVG put in Jan 2013, has not clotted to date.    [name] ESRD (end stage renal disease)     "TTS; Johnney OuRosalita Levan, [place]" (08/30/2012)    Medications:  See med rec  Assessment: Patient is a 57 y.o presented to the ED after developing CP while in dialysis today.  To start heparin for r/o ACS.  Goal of Therapy:  Heparin level 0.3-0.7  units/ml Monitor platelets by anticoagulation protocol: Yes   Plan:  1) heparin 4000 units IV x1 bolus, then drip at 850 units/hr 2) check 8 hr heparin level  Nathan Stallworth P 02/05/2013,4:56 PM

## 2013-02-05 NOTE — ED Notes (Signed)
Floor just called, stated room just now being cleaned, requesting to wait 20 minutes before bringing pt upstairs.

## 2013-02-06 DIAGNOSIS — I2510 Atherosclerotic heart disease of native coronary artery without angina pectoris: Secondary | ICD-10-CM | POA: Diagnosis not present

## 2013-02-06 DIAGNOSIS — D631 Anemia in chronic kidney disease: Secondary | ICD-10-CM | POA: Diagnosis not present

## 2013-02-06 DIAGNOSIS — N186 End stage renal disease: Secondary | ICD-10-CM | POA: Diagnosis not present

## 2013-02-06 DIAGNOSIS — E119 Type 2 diabetes mellitus without complications: Secondary | ICD-10-CM | POA: Diagnosis not present

## 2013-02-06 DIAGNOSIS — I120 Hypertensive chronic kidney disease with stage 5 chronic kidney disease or end stage renal disease: Secondary | ICD-10-CM | POA: Diagnosis not present

## 2013-02-06 DIAGNOSIS — R0789 Other chest pain: Secondary | ICD-10-CM | POA: Diagnosis not present

## 2013-02-06 DIAGNOSIS — I5042 Chronic combined systolic (congestive) and diastolic (congestive) heart failure: Secondary | ICD-10-CM | POA: Diagnosis present

## 2013-02-06 DIAGNOSIS — I359 Nonrheumatic aortic valve disorder, unspecified: Secondary | ICD-10-CM | POA: Diagnosis not present

## 2013-02-06 DIAGNOSIS — R079 Chest pain, unspecified: Secondary | ICD-10-CM | POA: Diagnosis not present

## 2013-02-06 LAB — CBC
Hemoglobin: 10.5 g/dL — ABNORMAL LOW (ref 12.0–15.0)
MCH: 32.3 pg (ref 26.0–34.0)
MCV: 96.3 fL (ref 78.0–100.0)
RBC: 3.25 MIL/uL — ABNORMAL LOW (ref 3.87–5.11)
WBC: 6 10*3/uL (ref 4.0–10.5)

## 2013-02-06 LAB — BASIC METABOLIC PANEL
BUN: 39 mg/dL — ABNORMAL HIGH (ref 6–23)
CO2: 29 mEq/L (ref 19–32)
Calcium: 8.5 mg/dL (ref 8.4–10.5)
Chloride: 94 mEq/L — ABNORMAL LOW (ref 96–112)
Glucose, Bld: 196 mg/dL — ABNORMAL HIGH (ref 70–99)
Potassium: 4.1 mEq/L (ref 3.5–5.1)
Sodium: 135 mEq/L (ref 135–145)

## 2013-02-06 LAB — GLUCOSE, CAPILLARY
Glucose-Capillary: 126 mg/dL — ABNORMAL HIGH (ref 70–99)
Glucose-Capillary: 114 mg/dL — ABNORMAL HIGH (ref 70–99)
Glucose-Capillary: 206 mg/dL — ABNORMAL HIGH (ref 70–99)

## 2013-02-06 MED ORDER — HEPARIN SODIUM (PORCINE) 5000 UNIT/ML IJ SOLN
5000.0000 [IU] | Freq: Three times a day (TID) | INTRAMUSCULAR | Status: DC
  Administered 2013-02-06 – 2013-02-07 (×4): 5000 [IU] via SUBCUTANEOUS
  Filled 2013-02-06 (×6): qty 1

## 2013-02-06 MED ORDER — CALCIUM CARBONATE 1250 (500 CA) MG PO TABS
1000.0000 mg | ORAL_TABLET | Freq: Every day | ORAL | Status: DC
  Administered 2013-02-07: 1000 mg via ORAL
  Filled 2013-02-06 (×2): qty 2

## 2013-02-06 MED ORDER — METOPROLOL TARTRATE 25 MG PO TABS
25.0000 mg | ORAL_TABLET | Freq: Two times a day (BID) | ORAL | Status: DC
  Administered 2013-02-06 – 2013-02-07 (×2): 25 mg via ORAL
  Filled 2013-02-06 (×3): qty 1

## 2013-02-06 MED ORDER — DARBEPOETIN ALFA-POLYSORBATE 40 MCG/0.4ML IJ SOLN
40.0000 ug | INTRAMUSCULAR | Status: AC
  Administered 2013-02-07: 40 ug via INTRAVENOUS
  Filled 2013-02-06: qty 0.4

## 2013-02-06 MED ORDER — RENA-VITE PO TABS
1.0000 | ORAL_TABLET | Freq: Every day | ORAL | Status: DC
  Administered 2013-02-06: 22:00:00 1 via ORAL
  Filled 2013-02-06 (×2): qty 1

## 2013-02-06 MED ORDER — AMLODIPINE BESYLATE 5 MG PO TABS
5.0000 mg | ORAL_TABLET | Freq: Every day | ORAL | Status: DC
  Administered 2013-02-06 – 2013-02-07 (×2): 5 mg via ORAL
  Filled 2013-02-06 (×3): qty 1

## 2013-02-06 NOTE — Progress Notes (Signed)
Pt. Became nauseous and vomited. I gave pt. Phenergan for the nausea and crackers and half of a cup of ginger ale. I advised MD in the room of pt.'s N/V and that I had given pt. Medication for the nausea.

## 2013-02-06 NOTE — Consult Note (Signed)
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 She has a history of two normal caths recently. She underwent a LHC in November 2013, by Dr. Allyson Sabal, revealing normal coronaries and again by Dr. Herbie Baltimore in June 2014 which showed a moderate lesion in the mid LAD (40-50%) stenosis, not flow limiting. EF at that time was 45-50%.  She presented to [facility] hospital on 12/27 after developing left-sided chest pain during HD, that was worrisome for unstable angina. She was admitted by [facility] and placed on IV heparin. EKG demonstrated no acute changes. Enzymes were negative x 3. Her pain ultimately resolved and she is now CP free. IV heparin has been discontinued.    Past Medical History  Diagnosis Date  . Liver disease     cirrhosis and this is why she had the transplant in 2011  . Chronic back pain   . Diverticulosis   . CAD (coronary artery disease)   . Obesity   . Peripheral vascular disease hands and legs  . Anxiety   . Asthma   . GERD (gastroesophageal reflux disease)     takes Omeprazole daily  . Chronic constipation     takes MIralax and Colace daily  . Anemia     takes Folic Acid daily  . Hypothyroidism     takes Synthroid daily  . Depression     takes Cymbalta for "severe" depression  . Neuromuscular disorder     carpal tunnel in right hand  . Hypertension     takes Metoprolol and Lisinopril daily, sees Dr Shary Decamp  . COPD (chronic obstructive pulmonary disease)   . Pneumonia     "today and several times before" (08/30/2012)  . Chronic bronchitis     "q yr w/season changes" (08/30/2012)  . Shortness of breath     "lying down and w/exertion; this past year" (08/30/2012)  . Type II diabetes mellitus     Levemir 2units daily if > 150  .  History of blood transfusion     "several" (08/30/2012)  . Cirrhosis of liver due to hepatitis C     "from blood transfusion back in 1983" (08/30/2012)  . Hepatitis C   . Migraine     "last migraine was in 2013" (08/30/2012)  . Headache     "at least monthly" (08/30/2012)  . Arthritis     "left hand, back" (08/30/2012)  . End stage renal disease on dialysis 02/27/2011    Kidneys shut down at time of liver transplant in Sept 2011 at [facility] in [place], she has been on HD ever since.  Dialyzes at [facility] HD on TTS schedule.  Had L forearm graft used 10 months then removed Dec 2012 due to suspected infection.  A right upper arm AVG was placed Dec 2012 but she developed steal symptoms acutely and it was ligated the same day.  Never had an AV fistula due to small veins.  Now has L thigh AVG put in Jan 2013, has not clotted to date.    [name] ESRD (end stage renal disease)     "TTS; [facility], [place]" (08/30/2012)    Past Surgical History  Procedure Laterality Date  . Liver transplant  10/25/2009    sees Dr Barnetta Hammersmith 1 every  6 months, saw last in Dec 2013. Odette Horns Coord [phone]  . Small intestine surgery  90's  . Thrombectomy    . Arteriovenous graft placement Left 10/03/10     forearm  . Avgg removal  12/23/2010    Procedure: REMOVAL OF ARTERIOVENOUS GORETEX GRAFT (AVGG);  Surgeon: Sherren Kerns, MD;  Location: [facility] OR;  Service: Vascular;  Laterality: Left;  procedure started @1736 -5784  . Insertion of dialysis catheter  12/23/2010    Procedure: INSERTION OF DIALYSIS CATHETER;  Surgeon: Sherren Kerns, MD;  Location: [facility] OR;  Service: Vascular;  Laterality: Right;  Right Internal Jugular 28cm dialysis catheter insertion procedure time 1701-1720   . Cholecystectomy  1993  . Cystoscopy  1990's  . Spinal growth rods  2010    "put 2 metal rods in my back; they had detetriorated" (08/30/2012)  . Av fistula placement  01/29/2011    Procedure: INSERTION OF ARTERIOVENOUS (AV) GORE-TEX GRAFT  ARM;  Surgeon: Sherren Kerns, MD;  Location: [facility] OR;  Service: Vascular;  Laterality: Right;  . Av fistula placement  03/10/2011    Procedure: INSERTION OF ARTERIOVENOUS (AV) GORE-TEX GRAFT THIGH;  Surgeon: Sherren Kerns, MD;  Location: [facility] OR;  Service: Vascular;  Laterality: Left;  . Tubal ligation  1990's  . Cardiac catheterization  2014    Family History  Problem Relation Age of Onset  . Cancer Mother   . Diabetes Mother   . Hypertension Mother   . Stroke Mother   . Cancer Father   . Anesthesia problems Neg Hx   . Hypotension Neg Hx   . Malignant hyperthermia Neg Hx   . Pseudochol deficiency Neg Hx     Social History:  reports that she has been smoking Cigarettes.  She has a 30 pack-year smoking history. She has never used smokeless tobacco. She reports that she drinks alcohol. She reports that she does not use illicit drugs.  Allergies:  Allergies  Allergen Reactions  . Acetaminophen Other (See Comments)    Liver problems  . Codeine Itching    Medications:  Prior to Admission medications   Medication Sig Start Date End Date Taking? Authorizing Provider  albuterol (PROVENTIL,VENTOLIN) 90 MCG/ACT inhaler Inhale 2 puffs into the lungs every 6 (six) hours as needed for wheezing. For shortness of breath   Yes Historical Provider, MD  aspirin EC 81 MG tablet Take 81 mg by mouth daily.     Yes Historical Provider, MD  CALCIUM CARBONATE PO Take 500 mg by mouth daily.   Yes Historical Provider, MD  cefdinir (OMNICEF) 300 MG capsule Take 300 mg by mouth 2 (two) times daily.   Yes Historical Provider, MD  cinacalcet (SENSIPAR) 30 MG tablet Take 30 mg by mouth daily.   Yes Historical Provider, MD  dextromethorphan-guaiFENesin (MUCINEX DM) 30-600 MG per 12 hr tablet Take 1 tablet by mouth 2 (two) times daily.   Yes Historical Provider, MD  dicyclomine (BENTYL) 10 MG capsule Take 10 mg by mouth every 6 (six) hours as needed (for stomach pain).   Yes Historical Provider, MD  docusate  sodium (COLACE) 100 MG capsule Take 100 mg by mouth 2 (two) times daily.     Yes Historical Provider, MD  DULoxetine (CYMBALTA) 60 MG capsule Take 60 mg by mouth daily.     Yes Historical Provider, MD  gabapentin (NEURONTIN) 300 MG capsule Take 300 mg by mouth at bedtime.    Yes Historical Provider, MD  insulin detemir (LEVEMIR) 100  UNIT/ML injection Inject 5 Units into the skin at bedtime as needed. Uses only if blood sugar levels are over 150 12/25/11  Yes Rhetta Mura, MD  levothyroxine (SYNTHROID, LEVOTHROID) 150 MCG tablet Take 150 mcg by mouth daily before breakfast.   Yes Historical Provider, MD  LORazepam (ATIVAN) 0.5 MG tablet Take 0.5 mg by mouth daily as needed for anxiety.   Yes Historical Provider, MD  metoprolol tartrate (LOPRESSOR) 25 MG tablet Take 12.5 mg by mouth 2 (two) times daily.   Yes Historical Provider, MD  midodrine (PROAMATINE) 10 MG tablet Take 10 mg by mouth daily.   Yes Historical Provider, MD  Multiple Vitamin (MULTIVITAMIN WITH MINERALS) TABS tablet Take 1 tablet by mouth daily.   Yes Historical Provider, MD  nicotine (NICODERM CQ - DOSED IN MG/24 HR) 7 mg/24hr patch Place 7 mg onto the skin daily.   Yes Historical Provider, MD  nitroGLYCERIN (NITROSTAT) 0.4 MG SL tablet Place 0.4 mg under the tongue every 5 (five) minutes as needed for chest pain.   Yes Historical Provider, MD  omeprazole (PRILOSEC) 20 MG capsule Take 20 mg by mouth daily.   Yes Historical Provider, MD  polyethylene glycol (MIRALAX / GLYCOLAX) packet Take 17 g by mouth 2 (two) times daily.    Yes Historical Provider, MD  promethazine (PHENERGAN) 12.5 MG tablet Take 12.5 mg by mouth every 6 (six) hours as needed for nausea or vomiting.   Yes Historical Provider, MD  senna (SENOKOT) 8.6 MG TABS tablet Take 1 tablet by mouth every evening.   Yes Historical Provider, MD  sevelamer carbonate (RENVELA) 800 MG tablet Take 800 mg by mouth 3 (three) times daily with meals.   Yes Historical Provider, MD    sucralfate (CARAFATE) 1 G tablet Take 1 g by mouth 2 (two) times daily.    Yes Historical Provider, MD  tacrolimus (PROGRAF) 1 MG capsule Take 5 mg by mouth 2 (two) times daily.   Yes Historical Provider, MD  traMADol (ULTRAM) 50 MG tablet Take by mouth every 6 (six) hours as needed.   Yes Historical Provider, MD     Results for orders placed during the hospital encounter of 02/05/13 (from the past 48 hour(s))  CBC WITH DIFFERENTIAL     Status: Abnormal   Collection Time    02/05/13 11:01 AM      Result Value Range   WBC 6.0  4.0 - 10.5 K/uL   RBC 3.43 (*) 3.87 - 5.11 MIL/uL   Hemoglobin 11.2 (*) 12.0 - 15.0 g/dL   HCT 40.9 (*) 81.1 - 91.4 %   MCV 95.3  78.0 - 100.0 fL   MCH 32.7  26.0 - 34.0 pg   MCHC 34.3  30.0 - 36.0 g/dL   RDW 78.2  95.6 - 21.3 %   Platelets 135 (*) 150 - 400 K/uL   Neutrophils Relative % 67  43 - 77 %   Neutro Abs 4.0  1.7 - 7.7 K/uL   Lymphocytes Relative 21  12 - 46 %   Lymphs Abs 1.3  0.7 - 4.0 K/uL   Monocytes Relative 10  3 - 12 %   Monocytes Absolute 0.6  0.1 - 1.0 K/uL   Eosinophils Relative 2  0 - 5 %   Eosinophils Absolute 0.1  0.0 - 0.7 K/uL   Basophils Relative 0  0 - 1 %   Basophils Absolute 0.0  0.0 - 0.1 K/uL  COMPREHENSIVE METABOLIC PANEL     Status: Abnormal  Collection Time    02/05/13 11:01 AM      Result Value Range   Sodium 137  135 - 145 mEq/L   Potassium 3.5  3.5 - 5.1 mEq/L   Chloride 96  96 - 112 mEq/L   CO2 27  19 - 32 mEq/L   Glucose, Bld 261 (*) 70 - 99 mg/dL   BUN 28 (*) 6 - 23 mg/dL   Creatinine, Ser 1.61 (*) 0.50 - 1.10 mg/dL   Calcium 8.6  8.4 - 09.6 mg/dL   Total Protein 6.9  6.0 - 8.3 g/dL   Albumin 3.0 (*) 3.5 - 5.2 g/dL   AST 26  0 - 37 U/L   Comment: HEMOLYSIS AT THIS LEVEL MAY AFFECT RESULT   ALT 15  0 - 35 U/L   Alkaline Phosphatase 70  39 - 117 U/L   Total Bilirubin 0.3  0.3 - 1.2 mg/dL   GFR calc non Af Amer 7 (*) >90 mL/min   GFR calc Af Amer 9 (*) >90 mL/min   Comment: (NOTE)     The eGFR has been  calculated using the CKD EPI equation.     This calculation has not been validated in all clinical situations.     eGFR's persistently <90 mL/min signify possible Chronic Kidney     Disease.  POCT I-STAT TROPONIN I     Status: None   Collection Time    02/05/13 11:13 AM      Result Value Range   Troponin i, poc 0.02  0.00 - 0.08 ng/mL   Comment 3            Comment: Due to the release kinetics of cTnI,     a negative result within the first hours     of the onset of symptoms does not rule out     myocardial infarction with certainty.     If myocardial infarction is still suspected,     repeat the test at appropriate intervals.  TROPONIN I     Status: None   Collection Time    02/05/13  1:49 PM      Result Value Range   Troponin I <0.30  <0.30 ng/mL   Comment:            Due to the release kinetics of cTnI,     a negative result within the first hours     of the onset of symptoms does not rule out     myocardial infarction with certainty.     If myocardial infarction is still suspected,     repeat the test at appropriate intervals.  GLUCOSE, CAPILLARY     Status: None   Collection Time    02/05/13  5:48 PM      Result Value Range   Glucose-Capillary 93  70 - 99 mg/dL  CBC     Status: Abnormal   Collection Time    02/05/13  6:12 PM      Result Value Range   WBC 6.6  4.0 - 10.5 K/uL   RBC 3.72 (*) 3.87 - 5.11 MIL/uL   Hemoglobin 11.9 (*) 12.0 - 15.0 g/dL   HCT 04.5  40.9 - 81.1 %   MCV 96.8  78.0 - 100.0 fL   MCH 32.0  26.0 - 34.0 pg   MCHC 33.1  30.0 - 36.0 g/dL   RDW 91.4  78.2 - 95.6 %   Platelets 127 (*) 150 - 400 K/uL  CREATININE, SERUM  Status: Abnormal   Collection Time    02/05/13  6:12 PM      Result Value Range   Creatinine, Ser 6.98 (*) 0.50 - 1.10 mg/dL   GFR calc non Af Amer 6 (*) >90 mL/min   GFR calc Af Amer 7 (*) >90 mL/min   Comment: (NOTE)     The eGFR has been calculated using the CKD EPI equation.     This calculation has not been validated  in all clinical situations.     eGFR's persistently <90 mL/min signify possible Chronic Kidney     Disease.  TROPONIN I     Status: None   Collection Time    02/05/13  6:18 PM      Result Value Range   Troponin I <0.30  <0.30 ng/mL   Comment:            Due to the release kinetics of cTnI,     a negative result within the first hours     of the onset of symptoms does not rule out     myocardial infarction with certainty.     If myocardial infarction is still suspected,     repeat the test at appropriate intervals.  GLUCOSE, CAPILLARY     Status: Abnormal   Collection Time    02/05/13  9:32 PM      Result Value Range   Glucose-Capillary 197 (*) 70 - 99 mg/dL   Comment 1 Notify RN    TROPONIN I     Status: None   Collection Time    02/05/13 11:27 PM      Result Value Range   Troponin I <0.30  <0.30 ng/mL   Comment:            Due to the release kinetics of cTnI,     a negative result within the first hours     of the onset of symptoms does not rule out     myocardial infarction with certainty.     If myocardial infarction is still suspected,     repeat the test at appropriate intervals.  HEPARIN LEVEL (UNFRACTIONATED)     Status: Abnormal   Collection Time    02/06/13  1:55 AM      Result Value Range   Heparin Unfractionated <0.10 (*) 0.30 - 0.70 IU/mL   Comment:            IF HEPARIN RESULTS ARE BELOW     EXPECTED VALUES, AND PATIENT     DOSAGE HAS BEEN CONFIRMED,     SUGGEST FOLLOW UP TESTING     OF ANTITHROMBIN III LEVELS.  BASIC METABOLIC PANEL     Status: Abnormal   Collection Time    02/06/13  1:55 AM      Result Value Range   Sodium 135  135 - 145 mEq/L   Potassium 4.1  3.5 - 5.1 mEq/L   Chloride 94 (*) 96 - 112 mEq/L   CO2 29  19 - 32 mEq/L   Glucose, Bld 196 (*) 70 - 99 mg/dL   BUN 39 (*) 6 - 23 mg/dL   Creatinine, Ser 1.61 (*) 0.50 - 1.10 mg/dL   Calcium 8.5  8.4 - 09.6 mg/dL   GFR calc non Af Amer 5 (*) >90 mL/min   GFR calc Af Amer 6 (*) >90 mL/min     Comment: (NOTE)     The eGFR has been calculated using the CKD EPI equation.  This calculation has not been validated in all clinical situations.     eGFR's persistently <90 mL/min signify possible Chronic Kidney     Disease.  CBC     Status: Abnormal   Collection Time    02/06/13  1:55 AM      Result Value Range   WBC 6.0  4.0 - 10.5 K/uL   RBC 3.25 (*) 3.87 - 5.11 MIL/uL   Hemoglobin 10.5 (*) 12.0 - 15.0 g/dL   HCT 16.1 (*) 09.6 - 04.5 %   MCV 96.3  78.0 - 100.0 fL   MCH 32.3  26.0 - 34.0 pg   MCHC 33.5  30.0 - 36.0 g/dL   RDW 40.9  81.1 - 91.4 %   Platelets 121 (*) 150 - 400 K/uL  GLUCOSE, CAPILLARY     Status: Abnormal   Collection Time    02/06/13  6:18 AM      Result Value Range   Glucose-Capillary 126 (*) 70 - 99 mg/dL   Comment 1 Notify RN    GLUCOSE, CAPILLARY     Status: Abnormal   Collection Time    02/06/13 11:01 AM      Result Value Range   Glucose-Capillary 114 (*) 70 - 99 mg/dL   Comment 1 Notify RN      Dg Chest 2 View  02/05/2013   CLINICAL DATA:  Chest pain and pressure and nausea following acute dialysis today. The patient is a history of coronary artery disease and COPD.  EXAM: CHEST  2 VIEW  COMPARISON:  Portable chest x-ray of November 03, 2012.  FINDINGS: The lungs are adequately inflated. There is no focal infiltrate. There is minimal prominence of the pulmonary interstitial markings but these findings are much improved over the study of November 03, 2012. The cardiopericardial silhouette is normal in size. The pulmonary vascularity is not engorged. There is no pleural effusion or pneumothorax. The observed portions of the bony thorax appear normal.  IMPRESSION: There is no evidence of pneumonia nor CHF nor other acute cardiopulmonary abnormality.   Electronically Signed   By: David  [place]   On: 02/05/2013 12:31   Ct Head Wo Contrast  02/05/2013   CLINICAL DATA:  Headache  EXAM: CT HEAD WITHOUT CONTRAST  TECHNIQUE: Contiguous axial images were  obtained from the base of the skull through the vertex without intravenous contrast.  COMPARISON:  06/15/2012  FINDINGS: No mass effect, midline shift, or acute intracranial hemorrhage. Minimal chronic ischemic changes in the periventricular white matter.  IMPRESSION: No acute intracranial pathology.   Electronically Signed   By: Maryclare Bean M.D.   On: 02/05/2013 13:14    Review of Systems  Constitutional: Positive for diaphoresis.  Respiratory: Positive for shortness of breath.   Cardiovascular: Positive for chest pain.  All other systems reviewed and are negative.   Blood pressure 176/80, pulse 69, temperature 97.8 F (36.6 C), temperature source Oral, resp. rate 20, height 5' 3.5" (1.613 m), weight 75.2 kg (165 lb 12.6 oz), SpO2 99.00%. Physical Exam  Constitutional: She is oriented to person, place, and time. She appears well-developed and well-nourished. No distress.  Neck: No JVD present. Carotid bruit is present (bilateral radiation of AS murmur).  Cardiovascular: Normal rate and regular rhythm.   Murmur (2/6 SM in aortic area) heard. Respiratory: Effort normal and breath sounds normal. No respiratory distress. She has no wheezes.  Musculoskeletal: She exhibits no edema.  Neurological: She is alert and oriented to person, place, and time.  Skin: Skin is warm and dry. She is not diaphoretic.  Psychiatric: She has a normal mood and affect. Her behavior is normal.    Assessment/Plan: Principal Problem:   Chest pain Active Problems:   HEPATITIS C- s/p liver transplant 2011   DIABETES MELLITUS, TYPE II   GERD   End stage renal disease on dialysis   Acute combined systolic and diastolic heart failure   CAD- moderate with 40% - 50% LAD 07/29/12  Plan: CP resolved. Enzymes negative x 3. EKG w/o ischemic changes. Last LHC was June of this year, revealing only mild 40-50% stenosis in the LAD. EF of 45-50%. In terms of cardiac meds she is only on a low dose BB and ASA. Will increase  Lopressor and will add amlodipine.No long acting nitrate due to AS.  SBP in the 170s.  Recommend keeping another day. Possibly home tomorrow. MD to follow.   SIMMONS, BRITTAINY 02/06/2013, 2:01 PM    Patient seen and examined. Agree with assessment and plan. Pt is a 57 AAF with a h/o hepatitis C s/p liver transplant and subsequent ESRD on hemodialysis.  She has h/o htn, DM , GERD, COPD, ongoing tobacco use and has mild CAD and AS.  Over the past year she has undergone cardiac cath in Nov, 2013([facility]) and most recently in June 2014 [facility]). At last cath she had 40 - 50 LAD lesion and 20% diagonal. Echo in June suggested moderate-severe AS and at cath by Dr. Herbie Baltimore AVG was <15 mm HG, and was felt only to be mild, but R heart cath was not performed. Pt does note exertional shortness of breat, and at times notes chest discomfort with lying flat not prompltly nitrate responsive. Yesterday during dialysis she felt diaphoretic, lightheadedness and then noted chest pain, ? Hypotensive. ECG shows LVH with repolarization changes. Troponins are negative. BP presently is elevated. In light of 2 recent caths, do not feel this is neceassary at this time. Will further titrate lopressor as BP allows, and will add low dose amlodipine for increased medical therapy. Consider future outpatient f/u echo to re-assess AV.   Lennette Bihari, MD, [facility] 02/06/2013 2:12 PM

## 2013-02-06 NOTE — Progress Notes (Signed)
Utilization Review completed.  

## 2013-02-06 NOTE — Consult Note (Signed)
I saw the patient and agree with the above assessment and plan.    Appreciate cardiology's input. Pt with HTN outside of HD but some low BPs with treatment.  Will march up EDW slowly, watch BP, and see if we can have stable HD and then address BP.

## 2013-02-06 NOTE — H&P (Signed)
  Date: 02/06/2013  Patient name: LENER VENTRESCA  Medical record number: [id number]  Date of birth: 07-26-1955   I have seen and evaluated Laurin Coder and discussed their care with the Residency Team. Ms Sindoni states that she has had issues with hypotension during HD and was placed on Midodrine on HD days. Last week, her dry weight was decreased bc of cont hypotension. Since then, she has had no hypotension during HD until the day of admission. During HD, she states that her SBP decreased to the 80's, her heart was racing, she got CP, felt as if she were falling, and had trouble talking. She states this is her usual rxn when she bc hypotensive except never this severe CP and never for 1 hour.   Assessment and Plan: I have seen and evaluated the patient as outlined above. I agree with the formulated Assessment and Plan as detailed in the residents' admission note, with the following changes:   1. CP, non cardiac - she has R/O for ACS by Trop I and EKG. Her Heparin gtt has been D/C'd. This is likely 2/2 her hypotension (would like to get HD records to document the pressure). Cards consult to determine if additional ischemic W/U is necessary.   2. Hypotension - during HD and likely cause of all of her sxs. To cont midodrine prior to HD. Will need to discuss with cards their rec to increase anti-HTN as need to balance daytime HTN with HD hypotension.   Burns Spain, MD 12/28/20142:15 PM

## 2013-02-06 NOTE — Progress Notes (Signed)
Pharmacy follow-up for Heparin:  Initial HL undetectable- called RN, he said patient only receiving SQ heparin (had orders for both- never started on IV - called MD to clarify - does want IV heparin, will cancel bolus since she had 5000 sq ~ 9 PM. Check 8 hr heparin level.  Tad Moore, BCPS  Clinical Pharmacist Pager [phone]  02/06/2013 2:46 AM

## 2013-02-06 NOTE — Progress Notes (Signed)
Subjective: Patient feels well this AM, no CP, SOB, N/V. She feels tired because she did not sleep well last night.   Objective: Vital signs in last 24 hours: Filed Vitals:   02/05/13 1725 02/05/13 2217 02/06/13 0136 02/06/13 0623  BP: 139/73 167/57 142/61 149/67  Pulse: 66 71 65 66  Temp: 98.1 F (36.7 C) 98.2 F (36.8 C) 97.2 F (36.2 C) 97.9 F (36.6 C)  TempSrc: Oral Oral Oral Oral  Resp: 18 17 18 18   Height: 5' 3.5" (1.613 m)     Weight: 74.4 kg (164 lb 0.4 oz)   75.2 kg (165 lb 12.6 oz)  SpO2: 98% 100% 100% 100%   Weight change:   Intake/Output Summary (Last 24 hours) at 02/06/13 1051 Last data filed at 02/06/13 0841  Gross per 24 hour  Intake    702 ml  Output      0 ml  Net    702 ml   Physical Exam General: alert, cooperative, NAD HEENT: NCAT, no scleral icterus, oropharynx clear and non-erythematous, dry mucous membranes  Neck: supple Lungs: clear to ascultation bilaterally, normal work of respiration, no wheezes, rales, ronchi Heart: 2-3/6 crescendo-descresendo murmur c/w AS best heard at LUSB, regular rate and rhythm, no gallops, or rubs Abdomen: soft, non-tender, non-distended, normal bowel sounds  Extremities: LLE fistula with bruit and palpable thrill, warm extremities bilaterally; no BLE edema Neurologic: alert & oriented X3, cranial nerves II-XII grossly intact, strength grossly intact, sensation intact to light touch  Lab Results: Basic Metabolic Panel:  Recent Labs Lab 02/05/13 1101 02/05/13 1812 02/06/13 0155  NA 137  --  135  K 3.5  --  4.1  CL 96  --  94*  CO2 27  --  29  GLUCOSE 261*  --  196*  BUN 28*  --  39*  CREATININE 5.74* 6.98* 7.83*  CALCIUM 8.6  --  8.5   Liver Function Tests:  Recent Labs Lab 02/05/13 1101  AST 26  ALT 15  ALKPHOS 70  BILITOT 0.3  PROT 6.9  ALBUMIN 3.0*   CBC:  Recent Labs Lab 02/05/13 1101 02/05/13 1812 02/06/13 0155  WBC 6.0 6.6 6.0  NEUTROABS 4.0  --   --   HGB 11.2* 11.9* 10.5*  HCT  32.7* 36.0 31.3*  MCV 95.3 96.8 96.3  PLT 135* 127* 121*   Cardiac Enzymes:  Recent Labs Lab 02/05/13 1349 02/05/13 1818 02/05/13 2327  TROPONINI <0.30 <0.30 <0.30   CBG:  Recent Labs Lab 02/05/13 1748 02/05/13 2132 02/06/13 0618  GLUCAP 93 197* 126*   Studies/Results: Dg Chest 2 View  02/05/2013   CLINICAL DATA:  Chest pain and pressure and nausea following acute dialysis today. The patient is a history of coronary artery disease and COPD.  EXAM: CHEST  2 VIEW  COMPARISON:  Portable chest x-ray of November 03, 2012.  FINDINGS: The lungs are adequately inflated. There is no focal infiltrate. There is minimal prominence of the pulmonary interstitial markings but these findings are much improved over the study of November 03, 2012. The cardiopericardial silhouette is normal in size. The pulmonary vascularity is not engorged. There is no pleural effusion or pneumothorax. The observed portions of the bony thorax appear normal.  IMPRESSION: There is no evidence of pneumonia nor CHF nor other acute cardiopulmonary abnormality.   Electronically Signed   By: David  [place]   On: 02/05/2013 12:31   Ct Head Wo Contrast  02/05/2013   CLINICAL DATA:  Headache  EXAM: CT HEAD WITHOUT CONTRAST  TECHNIQUE: Contiguous axial images were obtained from the base of the skull through the vertex without intravenous contrast.  COMPARISON:  06/15/2012  FINDINGS: No mass effect, midline shift, or acute intracranial hemorrhage. Minimal chronic ischemic changes in the periventricular white matter.  IMPRESSION: No acute intracranial pathology.   Electronically Signed   By: Maryclare Bean M.D.   On: 02/05/2013 13:14   Medications: I have reviewed the patient's current medications. Scheduled Meds: . aspirin EC  81 mg Oral Daily  . calcium carbonate  500 mg of elemental calcium Oral Q breakfast  . cefUROXime  500 mg Oral BID  . cinacalcet  30 mg Oral Q breakfast  . dextromethorphan-guaiFENesin  1 tablet Oral BID    . docusate sodium  100 mg Oral BID  . DULoxetine  60 mg Oral Daily  . gabapentin  300 mg Oral QHS  . insulin aspart  0-9 Units Subcutaneous TID WC  . levothyroxine  150 mcg Oral QAC breakfast  . metoprolol tartrate  12.5 mg Oral BID  . multivitamin  1 tablet Oral QHS  . nicotine  14 mg Transdermal Daily  . nicotine  7 mg Transdermal Daily  . pantoprazole  40 mg Oral Daily  . polyethylene glycol  17 g Oral BID  . senna  1 tablet Oral QPM  . sevelamer carbonate  800 mg Oral TID WC  . sucralfate  1 g Oral BID  . Sucroferric Oxyhydroxide  500 mg Oral TID  . tacrolimus  5 mg Oral BID   Continuous Infusions:  PRN Meds:.acetaminophen, dicyclomine, LORazepam, morphine injection, nitroGLYCERIN, promethazine, traMADol  Assessment/Plan: #Chest pain- Pt ruled out for ACS with three negative, so heparin gtt discontinued. Cardiology consulted today. TIMI score of 2 (8% risk at 14 days of all cause mortality, MI, severe ischemia). History of CAD with recent cardiac catheterization in 07/2012 which showed LAD 40-45% stenosis. EKG w/o changes from prior. -cardiology wants to increase lopressor to 25mg  BID and add amlodipine 5mg  daily given increased BPs today; if BPs stable tomorrow, will plan for discharge tomorrow -continue ASA 81 mg daily  -canceled echo  -pt not on statin likely because of her hx of liver transplant; will need to address this at f/u visit with PCP  #ESRD on HD- left 12/27 session early due to chest pain (received total 3hrs). States that she does not miss any sessions and always stays full amount of time. K stable at 4.1 today. Pt notes she has been restricting her fluid intake quite a bit. She has been instructed to not drink more than 32oz of fluid daily, though she drinks much less than this. Her mouth is dry and I encouraged her to drink a small amount of fluid to see if this helps. -patient will likely be able to resume her usual HD schedule at discharge  -renal diet  -d/c  midodrine as pt only takes this with HD  #S/p liver transplant- in 2011. History of hepatitis C and cirrhosis 2/2 EtOH abuse. On tacrolimus  -tacrolimus level pending   #DM2- Last A1C 6.6% in 08/2012. On gabapentin 300 daily qhs.  -CBGs q4h   #HTN- BP 170s/80s today. On metoprolol 12.5 mg BID at home, though cardiology increased this to 25mg  BID today. Cardiology also added amlodipine 5mg  daily. -monitor overnight with hope to discharge her tomorrow if BP stable  #Hypothyroidism- Continue Synthroid 150 mcg daily.   #DVT PPX- heparin  #Code status- DNR (but does want  intubation)  Dispo: Disposition is deferred at this time, awaiting improvement of current medical problems.  Anticipated discharge in approximately 1 day(s).    The patient does have a current PCP Hal Morales, NP) and does not need an [facility] hospital follow-up appointment after discharge.  The patient does not have transportation limitations that hinder transportation to clinic appointments.  .Services Needed at time of discharge: Y = Yes, Blank = No PT:   OT:   RN:   Equipment:   Other:     LOS: 1 day   Windell Hummingbird, MD 02/06/2013, 10:51 AM

## 2013-02-06 NOTE — Consult Note (Signed)
[facility] Renal Consultation Note    Indication for Consultation:  Management of ESRD/hemodialysis; anemia, hypertension/volume and secondary hyperparathyroidism  HPI: [name] is a 57 y.o. female ESRD patient (TTS HD) with multiple medical problems including CAD, COPD, hepatitis C and cirrhosis s/p liver transplant in 2011 who presented to the ED after experiencing 10/10 chest pain, shortness of breath and "clamminess" about 2.5 hours into her outpatient hemodialysis session on Saturday. She was admitted for work-up of possible unstable angina, however EKG was negative for ischemic changes and enzymes remained negative. Cardiology was consulted. She has had a two recent cardiac caths which indicated moderate to severe aortic stenosis, 40-50% lesion to LAD and EF of 45-50%.  Her chest pain has resolved, however her systolic blood pressures remain elevated in the 170s.  She denies emerging complaints.    Past Medical History  Diagnosis Date  . Liver disease     cirrhosis and this is why she had the transplant in 2011  . Chronic back pain   . Diverticulosis   . CAD (coronary artery disease)   . Obesity   . Peripheral vascular disease hands and legs  . Anxiety   . Asthma   . GERD (gastroesophageal reflux disease)     takes Omeprazole daily  . Chronic constipation     takes MIralax and Colace daily  . Anemia     takes Folic Acid daily  . Hypothyroidism     takes Synthroid daily  . Depression     takes Cymbalta for "severe" depression  . Neuromuscular disorder     carpal tunnel in right hand  . Hypertension     takes Metoprolol and Lisinopril daily, sees Dr Shary Decamp  . COPD (chronic obstructive pulmonary disease)   . Pneumonia     "today and several times before" (08/30/2012)  . Chronic bronchitis     "q yr w/season changes" (08/30/2012)  . Shortness of breath     "lying down and w/exertion; this past year" (08/30/2012)  . Type II diabetes mellitus     Levemir  2units daily if > 150  . History of blood transfusion     "several" (08/30/2012)  . Cirrhosis of liver due to hepatitis C     "from blood transfusion back in 1983" (08/30/2012)  . Hepatitis C   . Migraine     "last migraine was in 2013" (08/30/2012)  . Headache     "at least monthly" (08/30/2012)  . Arthritis     "left hand, back" (08/30/2012)  . End stage renal disease on dialysis 02/27/2011    Kidneys shut down at time of liver transplant in Sept 2011 at [facility] in [place], she has been on HD ever since.  Dialyzes at [facility] HD on TTS schedule.  Had L forearm graft used 10 months then removed Dec 2012 due to suspected infection.  A right upper arm AVG was placed Dec 2012 but she developed steal symptoms acutely and it was ligated the same day.  Never had an AV fistula due to small veins.  Now has L thigh AVG put in Jan 2013, has not clotted to date.    [name] ESRD (end stage renal disease)     "TTS; [facility], [place]" (08/30/2012)   Past Surgical History  Procedure Laterality Date  . Liver transplant  10/25/2009    sees Dr Barnetta Hammersmith 1 every 6 months, saw last in Dec 2013. Odette Horns Coord [phone]  . Small intestine surgery  90's  . Thrombectomy    . Arteriovenous graft placement Left 10/03/10     forearm  . Avgg removal  12/23/2010    Procedure: REMOVAL OF ARTERIOVENOUS GORETEX GRAFT (AVGG);  Surgeon: Sherren Kerns, MD;  Location: [facility] OR;  Service: Vascular;  Laterality: Left;  procedure started @1736 -1852  . Insertion of dialysis catheter  12/23/2010    Procedure: INSERTION OF DIALYSIS CATHETER;  Surgeon: Sherren Kerns, MD;  Location[facility] OR;  Service: Vascular;  Laterality: Right;  Right Internal Jugular 28cm dialysis catheter insertion procedure time 1701-1720   . Cholecystectomy  1993  . Cystoscopy  1990's  . Spinal growth rods  2010    "put 2 metal rods in my back; they had detetriorated" (08/30/2012)  . Av fistula placement  01/29/2011    Procedure: INSERTION OF  ARTERIOVENOUS (AV) GORE-TEX GRAFT ARM;  Surgeon: Sherren Kerns, MD;  Location: [facility] OR;  Service: Vascular;  Laterality: Right;  . Av fistula placement  03/10/2011    Procedure: INSERTION OF ARTERIOVENOUS (AV) GORE-TEX GRAFT THIGH;  Surgeon: Sherren Kerns, MD;  Location: [facility] OR;  Service: Vascular;  Laterality: Left;  . Tubal ligation  1990's  . Cardiac catheterization  2014   Family History  Problem Relation Age of Onset  . Cancer Mother   . Diabetes Mother   . Hypertension Mother   . Stroke Mother   . Cancer Father   . Anesthesia problems Neg Hx   . Hypotension Neg Hx   . Malignant hyperthermia Neg Hx   . Pseudochol deficiency Neg Hx    Social History:  reports that she has been smoking Cigarettes.  She has a 30 pack-year smoking history. She has never used smokeless tobacco. She reports that she drinks alcohol. She reports that she does not use illicit drugs. Allergies  Allergen Reactions  . Acetaminophen Other (See Comments)    Liver problems  . Codeine Itching   Prior to Admission medications   Medication Sig Start Date End Date Taking? Authorizing Provider  albuterol (PROVENTIL,VENTOLIN) 90 MCG/ACT inhaler Inhale 2 puffs into the lungs every 6 (six) hours as needed for wheezing. For shortness of breath   Yes Historical Provider, MD  aspirin EC 81 MG tablet Take 81 mg by mouth daily.     Yes Historical Provider, MD  CALCIUM CARBONATE PO Take 500 mg by mouth daily.   Yes Historical Provider, MD  cefdinir (OMNICEF) 300 MG capsule Take 300 mg by mouth 2 (two) times daily.   Yes Historical Provider, MD  cinacalcet (SENSIPAR) 30 MG tablet Take 30 mg by mouth daily.   Yes Historical Provider, MD  dextromethorphan-guaiFENesin (MUCINEX DM) 30-600 MG per 12 hr tablet Take 1 tablet by mouth 2 (two) times daily.   Yes Historical Provider, MD  dicyclomine (BENTYL) 10 MG capsule Take 10 mg by mouth every 6 (six) hours as needed (for stomach pain).   Yes Historical Provider, MD  docusate  sodium (COLACE) 100 MG capsule Take 100 mg by mouth 2 (two) times daily.     Yes Historical Provider, MD  DULoxetine (CYMBALTA) 60 MG capsule Take 60 mg by mouth daily.     Yes Historical Provider, MD  gabapentin (NEURONTIN) 300 MG capsule Take 300 mg by mouth at bedtime.    Yes Historical Provider, MD  insulin detemir (LEVEMIR) 100 UNIT/ML injection Inject 5 Units into the skin at bedtime as needed. Uses only if blood sugar levels are over 150 12/25/11  Yes Jai-Gurmukh  Samtani, MD  levothyroxine (SYNTHROID, LEVOTHROID) 150 MCG tablet Take 150 mcg by mouth daily before breakfast.   Yes Historical Provider, MD  LORazepam (ATIVAN) 0.5 MG tablet Take 0.5 mg by mouth daily as needed for anxiety.   Yes Historical Provider, MD  metoprolol tartrate (LOPRESSOR) 25 MG tablet Take 12.5 mg by mouth 2 (two) times daily.   Yes Historical Provider, MD  midodrine (PROAMATINE) 10 MG tablet Take 10 mg by mouth daily.   Yes Historical Provider, MD  Multiple Vitamin (MULTIVITAMIN WITH MINERALS) TABS tablet Take 1 tablet by mouth daily.   Yes Historical Provider, MD  nicotine (NICODERM CQ - DOSED IN MG/24 HR) 7 mg/24hr patch Place 7 mg onto the skin daily.   Yes Historical Provider, MD  nitroGLYCERIN (NITROSTAT) 0.4 MG SL tablet Place 0.4 mg under the tongue every 5 (five) minutes as needed for chest pain.   Yes Historical Provider, MD  omeprazole (PRILOSEC) 20 MG capsule Take 20 mg by mouth daily.   Yes Historical Provider, MD  polyethylene glycol (MIRALAX / GLYCOLAX) packet Take 17 g by mouth 2 (two) times daily.    Yes Historical Provider, MD  promethazine (PHENERGAN) 12.5 MG tablet Take 12.5 mg by mouth every 6 (six) hours as needed for nausea or vomiting.   Yes Historical Provider, MD  senna (SENOKOT) 8.6 MG TABS tablet Take 1 tablet by mouth every evening.   Yes Historical Provider, MD  sevelamer carbonate (RENVELA) 800 MG tablet Take 800 mg by mouth 3 (three) times daily with meals.   Yes Historical Provider, MD   sucralfate (CARAFATE) 1 G tablet Take 1 g by mouth 2 (two) times daily.    Yes Historical Provider, MD  Sucroferric Oxyhydroxide (VELPHORO) 500 MG CHEW Chew 500 mg by mouth 3 (three) times daily.   Yes Historical Provider, MD  tacrolimus (PROGRAF) 1 MG capsule Take 5 mg by mouth 2 (two) times daily.   Yes Historical Provider, MD  traMADol (ULTRAM) 50 MG tablet Take by mouth every 6 (six) hours as needed.   Yes Historical Provider, MD   Current Facility-Administered Medications  Medication Dose Route Frequency Provider Last Rate Last Dose  . acetaminophen (TYLENOL) tablet 650 mg  650 mg Oral Q4H PRN Christen Bame, MD      . aspirin EC tablet 81 mg  81 mg Oral Daily Christen Bame, MD      . calcium carbonate (OS-CAL - dosed in mg of elemental calcium) tablet 500 mg of elemental calcium  500 mg of elemental calcium Oral Q breakfast Christen Bame, MD      . cefUROXime (CEFTIN) tablet 500 mg  500 mg Oral BID Christen Bame, MD   500 mg at 02/05/13 2116  . cinacalcet (SENSIPAR) tablet 30 mg  30 mg Oral Q breakfast Christen Bame, MD      . dextromethorphan-guaiFENesin [facility] DM) 30-600 MG per 12 hr tablet 1 tablet  1 tablet Oral BID Christen Bame, MD   1 tablet at 02/05/13 2115  . dicyclomine (BENTYL) capsule 10 mg  10 mg Oral Q6H PRN Christen Bame, MD      . docusate sodium (COLACE) capsule 100 mg  100 mg Oral BID Christen Bame, MD   100 mg at 02/05/13 2116  . DULoxetine (CYMBALTA) DR capsule 60 mg  60 mg Oral Daily Christen Bame, MD   60 mg at 02/05/13 2124  . gabapentin (NEURONTIN) capsule 300 mg  300 mg Oral QHS Christen Bame, MD   300 mg  at 02/05/13 2115  . insulin aspart (novoLOG) injection 0-9 Units  0-9 Units Subcutaneous TID WC Christen Bame, MD   1 Units at 02/06/13 [phone]  . levothyroxine (SYNTHROID, LEVOTHROID) tablet 150 mcg  150 mcg Oral QAC breakfast Christen Bame, MD      . LORazepam (ATIVAN) tablet 0.5 mg  0.5 mg Oral Daily PRN Christen Bame, MD      . metoprolol tartrate (LOPRESSOR) tablet 12.5 mg  12.5 mg Oral BID Christen Bame, MD   12.5 mg at 02/05/13 2115  . morphine 2 MG/ML injection 2 mg  2 mg Intravenous Q2H PRN Christen Bame, MD      . multivitamin with minerals tablet 1 tablet  1 tablet Oral Daily Christen Bame, MD      . nicotine (NICODERM CQ - dosed in mg/24 hours) patch 14 mg  14 mg Transdermal Daily Christen Bame, MD   14 mg at 02/05/13 1627  . nicotine (NICODERM CQ - dosed in mg/24 hr) patch 7 mg  7 mg Transdermal Daily Christen Bame, MD      . nitroGLYCERIN (NITROSTAT) SL tablet 0.4 mg  0.4 mg Sublingual Q5 min PRN Christen Bame, MD      . pantoprazole (PROTONIX) EC tablet 40 mg  40 mg Oral Daily Christen Bame, MD      . polyethylene glycol (MIRALAX / GLYCOLAX) packet 17 g  17 g Oral BID Christen Bame, MD   17 g at 02/05/13 2123  . promethazine (PHENERGAN) tablet 12.5 mg  12.5 mg Oral Q6H PRN Christen Bame, MD      . senna (SENOKOT) tablet 8.6 mg  1 tablet Oral QPM Christen Bame, MD      . sevelamer carbonate (RENVELA) tablet 800 mg  800 mg Oral TID WC Christen Bame, MD      . sucralfate (CARAFATE) tablet 1 g  1 g Oral BID Christen Bame, MD      . Sucroferric Oxyhydroxide CHEW 500 mg  500 mg Oral TID Christen Bame, MD      . tacrolimus (PROGRAF) capsule 5 mg  5 mg Oral BID Christen Bame, MD   5 mg at 02/05/13 2116  . traMADol (ULTRAM) tablet 50 mg  50 mg Oral Q6H PRN Christen Bame, MD   50 mg at 02/05/13 2139   Labs: Basic Metabolic Panel:  Recent Labs Lab 02/05/13 1101 02/05/13 1812 02/06/13 0155  NA 137  --  135  K 3.5  --  4.1  CL 96  --  94*  CO2 27  --  29  GLUCOSE 261*  --  196*  BUN 28*  --  39*  CREATININE 5.74* 6.98* 7.83*  CALCIUM 8.6  --  8.5   Liver Function Tests:  Recent Labs Lab 02/05/13 1101  AST 26  ALT 15  ALKPHOS 70  BILITOT 0.3  PROT 6.9  ALBUMIN 3.0*   CBC:  Recent Labs Lab 02/05/13 1101 02/05/13 1812 02/06/13 0155  WBC 6.0 6.6 6.0  NEUTROABS 4.0  --   --   HGB 11.2* 11.9* 10.5*  HCT 32.7* 36.0 31.3*  MCV 95.3 96.8 96.3  PLT 135* 127* 121*   Cardiac Enzymes:  Recent Labs Lab  02/05/13 1349 02/05/13 1818 02/05/13 2327  TROPONINI <0.30 <0.30 <0.30   CBG:  Recent Labs Lab 02/05/13 1748 02/05/13 2132 02/06/13 0618  GLUCAP 93 197* 126*   Studies/Results: Dg Chest 2 View  02/05/2013   CLINICAL DATA:  Chest pain and pressure and nausea  following acute dialysis today. The patient is a history of coronary artery disease and COPD.  EXAM: CHEST  2 VIEW  COMPARISON:  Portable chest x-ray of November 03, 2012.  FINDINGS: The lungs are adequately inflated. There is no focal infiltrate. There is minimal prominence of the pulmonary interstitial markings but these findings are much improved over the study of November 03, 2012. The cardiopericardial silhouette is normal in size. The pulmonary vascularity is not engorged. There is no pleural effusion or pneumothorax. The observed portions of the bony thorax appear normal.  IMPRESSION: There is no evidence of pneumonia nor CHF nor other acute cardiopulmonary abnormality.   Electronically Signed   By: David  [place]   On: 02/05/2013 12:31   Ct Head Wo Contrast  02/05/2013   CLINICAL DATA:  Headache  EXAM: CT HEAD WITHOUT CONTRAST  TECHNIQUE: Contiguous axial images were obtained from the base of the skull through the vertex without intravenous contrast.  COMPARISON:  06/15/2012  FINDINGS: No mass effect, midline shift, or acute intracranial hemorrhage. Minimal chronic ischemic changes in the periventricular white matter.  IMPRESSION: No acute intracranial pathology.   Electronically Signed   By: Maryclare Bean M.D.   On: 02/05/2013 13:14    ROS: 10 pt ROS asked and answered. All systems negative except as in the HPI aboe.   Physical Exam: Filed Vitals:   02/05/13 1725 02/05/13 2217 02/06/13 0136 02/06/13 0623  BP: 139/73 167/57 142/61 149/67  Pulse: 66 71 65 66  Temp: 98.1 F (36.7 C) 98.2 F (36.8 C) 97.2 F (36.2 C) 97.9 F (36.6 C)  TempSrc: Oral Oral Oral Oral  Resp: 18 17 18 18   Height: 5' 3.5" (1.613 m)     Weight:  74.4 kg (164 lb 0.4 oz)   75.2 kg (165 lb 12.6 oz)  SpO2: 98% 100% 100% 100%     General: Well developed, well nourished, in no acute distress. Head: Normocephalic, atraumatic, sclera non-icteric, mucus membranes are moist Neck: Supple. JVD not elevated. Lungs: Clear bilaterally to auscultation without wheezes, rales, or rhonchi. Breathing is unlabored. Heart: RRR with S1 S2. 2/6 murmur. No rubs or gallops appreciated. Abdomen: Soft, non-tender, non-distended with normoactive bowel sounds. No rebound/guarding. No obvious abdominal masses. M-S:  Strength and tone appear normal for age. Lower extremities:without edema or ischemic changes, no open wounds  Neuro: Alert and oriented X 3. Moves all extremities spontaneously. Psych:  Responds to questions appropriately with a normal affect. Dialysis Access: L thigh AVG + bruit  Dialysis Orders: Center: [facility]  on TTS . EDW 73.5kg HD Bath 2K/2.25Ca  Time 4:00 Heparin NONE. Access L thigh AVG BFR 400 DFR A1.5  Hectorol 4 mcg IV/HD Epogen 1000 q wk   Units IV/HD  Venofer  50 q wk Recent labs: Hgb 11.6, Tsat 36%, P 6.1, PTH 194  Assessment/Plan: 1. Chest Pain - Mgmt per primary. Possibly atypical with concern for CAD given symptoms. Cardiology consulted. 2. ESRD -  TTS, Got 2.5 hrs on Saturday. K+4.1. Next HD tomorrow per holiday schedule. First shift in anticipation of d/c. 3. Hypertension/volume  - SBPs 140s-170s. Per Cardiology, home dose of lopressor 12.5 mg BID increased to 25 BID and Amlodipine 5 mg added. Has also been on midodrine pre-hd as op. Hold here and evaluate further need at d/c. CXR clear. Up ~2kg here by weights. HD tomorrow, UF 2L as tolerated. Will eval post wgts and increase edw. 4. Anemia  - Hgb 10.5 on weekly op Epo. Aranesp 40  ordered for here. Next Fe dose as outpatient.   5. Metabolic bone disease -  Ca 8.5. Last op Phos 6.1. PTH within range. Did not like Velphoro. Ca carbonate is present binder per pt. Cont Vit D. 6. Nutrition  - Renal diet/ multivitamin 7. DM - on insulin  8. Hx Liver transplant - 2011. On Prograf. 9. Depression/Anxiety - home meds per primary 10. Dispo - Per primary and Cardiology. Should be ok for d/c after HD tomorrow from renal standpoint. Will increase edw.  Claud Kelp, PA-C [facility] Pager [phone] 02/06/2013, 8:33 AM

## 2013-02-07 DIAGNOSIS — I5023 Acute on chronic systolic (congestive) heart failure: Secondary | ICD-10-CM

## 2013-02-07 DIAGNOSIS — I2510 Atherosclerotic heart disease of native coronary artery without angina pectoris: Secondary | ICD-10-CM | POA: Diagnosis not present

## 2013-02-07 DIAGNOSIS — I10 Essential (primary) hypertension: Secondary | ICD-10-CM

## 2013-02-07 DIAGNOSIS — R079 Chest pain, unspecified: Secondary | ICD-10-CM | POA: Diagnosis not present

## 2013-02-07 DIAGNOSIS — I509 Heart failure, unspecified: Secondary | ICD-10-CM

## 2013-02-07 DIAGNOSIS — N186 End stage renal disease: Secondary | ICD-10-CM | POA: Diagnosis not present

## 2013-02-07 DIAGNOSIS — I359 Nonrheumatic aortic valve disorder, unspecified: Secondary | ICD-10-CM | POA: Diagnosis not present

## 2013-02-07 LAB — GLUCOSE, CAPILLARY
Glucose-Capillary: 137 mg/dL — ABNORMAL HIGH (ref 70–99)
Glucose-Capillary: 84 mg/dL (ref 70–99)

## 2013-02-07 LAB — RENAL FUNCTION PANEL
BUN: 57 mg/dL — ABNORMAL HIGH (ref 6–23)
CO2: 29 mEq/L (ref 19–32)
Calcium: 9.3 mg/dL (ref 8.4–10.5)
Creatinine, Ser: 10.31 mg/dL — ABNORMAL HIGH (ref 0.50–1.10)
GFR calc Af Amer: 4 mL/min — ABNORMAL LOW (ref >90)
GFR calc non Af Amer: 4 mL/min — ABNORMAL LOW (ref >90)
Glucose, Bld: 146 mg/dL — ABNORMAL HIGH (ref 70–99)

## 2013-02-07 MED ORDER — DARBEPOETIN ALFA-POLYSORBATE 40 MCG/0.4ML IJ SOLN
INTRAMUSCULAR | Status: AC
  Filled 2013-02-07: qty 0.4

## 2013-02-07 MED ORDER — MIDODRINE HCL 10 MG PO TABS: 10.0000 mg | ORAL_TABLET | ORAL | Status: DC | PRN

## 2013-02-07 MED ORDER — METOPROLOL TARTRATE 25 MG PO TABS: 25.0000 mg | ORAL_TABLET | Freq: Two times a day (BID) | ORAL | Status: DC

## 2013-02-07 NOTE — Progress Notes (Signed)
Subjective: Patient feels well this AM, no CP, SOB, N/V. She was in HD when I saw her and she feels ready for discharge.   Objective: Vital signs in last 24 hours: Filed Vitals:   02/07/13 0930 02/07/13 1000 02/07/13 1030 02/07/13 1100  BP: 98/58 131/70 106/62 116/60  Pulse: 66 72 68 66  Temp:      TempSrc:      Resp:      Height:      Weight:      SpO2:       Weight change: 1.169 kg (2 lb 9.2 oz)  Intake/Output Summary (Last 24 hours) at 02/07/13 1103 Last data filed at 02/07/13 0900  Gross per 24 hour  Intake    480 ml  Output      0 ml  Net    480 ml   Physical Exam General: alert, cooperative, NAD HEENT: NCAT, no scleral icterus, oropharynx clear and non-erythematous Neck: supple Lungs: clear to ascultation bilaterally, normal work of respiration, no wheezes, rales, ronchi Heart: 2-3/6 crescendo-descresendo murmur c/w AS best heard at LUSB, regular rate and rhythm, no gallops, or rubs Abdomen: soft, non-tender, non-distended, normal bowel sounds  Extremities: LLE fistula with bruit and palpable thrill, warm extremities bilaterally; no BLE edema Neurologic: alert & oriented X3, cranial nerves II-XII grossly intact, strength grossly intact, sensation intact to light touch  Lab Results: Basic Metabolic Panel:  Recent Labs Lab 02/06/13 0155 02/07/13 0534  NA 135 138  K 4.1 5.1  CL 94* 94*  CO2 29 29  GLUCOSE 196* 146*  BUN 39* 57*  CREATININE 7.83* 10.31*  CALCIUM 8.5 9.3  PHOS  --  8.3*   Liver Function Tests:  Recent Labs Lab 02/05/13 1101 02/07/13 0534  AST 26  --   ALT 15  --   ALKPHOS 70  --   BILITOT 0.3  --   PROT 6.9  --   ALBUMIN 3.0* 2.7*   CBC:  Recent Labs Lab 02/05/13 1101 02/05/13 1812 02/06/13 0155  WBC 6.0 6.6 6.0  NEUTROABS 4.0  --   --   HGB 11.2* 11.9* 10.5*  HCT 32.7* 36.0 31.3*  MCV 95.3 96.8 96.3  PLT 135* 127* 121*   Cardiac Enzymes:  Recent Labs Lab 02/05/13 1349 02/05/13 1818 02/05/13 2327  TROPONINI  <0.30 <0.30 <0.30   CBG:  Recent Labs Lab 02/05/13 2132 02/06/13 0618 02/06/13 1101 02/06/13 1617 02/06/13 2211 02/07/13 0629  GLUCAP 197* 126* 114* 124* 206* 137*   Studies/Results: Dg Chest 2 View  02/05/2013   CLINICAL DATA:  Chest pain and pressure and nausea following acute dialysis today. The patient is a history of coronary artery disease and COPD.  EXAM: CHEST  2 VIEW  COMPARISON:  Portable chest x-ray of November 03, 2012.  FINDINGS: The lungs are adequately inflated. There is no focal infiltrate. There is minimal prominence of the pulmonary interstitial markings but these findings are much improved over the study of November 03, 2012. The cardiopericardial silhouette is normal in size. The pulmonary vascularity is not engorged. There is no pleural effusion or pneumothorax. The observed portions of the bony thorax appear normal.  IMPRESSION: There is no evidence of pneumonia nor CHF nor other acute cardiopulmonary abnormality.   Electronically Signed   By: David  [place]   On: 02/05/2013 12:31   Ct Head Wo Contrast  02/05/2013   CLINICAL DATA:  Headache  EXAM: CT HEAD WITHOUT CONTRAST  TECHNIQUE: Contiguous axial images were  obtained from the base of the skull through the vertex without intravenous contrast.  COMPARISON:  06/15/2012  FINDINGS: No mass effect, midline shift, or acute intracranial hemorrhage. Minimal chronic ischemic changes in the periventricular white matter.  IMPRESSION: No acute intracranial pathology.   Electronically Signed   By: Maryclare Bean M.D.   On: 02/05/2013 13:14   Medications: I have reviewed the patient's current medications. Scheduled Meds: . amLODipine  5 mg Oral Daily  . aspirin EC  81 mg Oral Daily  . calcium carbonate  1,000 mg of elemental calcium Oral Q breakfast  . cinacalcet  30 mg Oral Q breakfast  . dextromethorphan-guaiFENesin  1 tablet Oral BID  . docusate sodium  100 mg Oral BID  . DULoxetine  60 mg Oral Daily  . gabapentin  300 mg  Oral QHS  . heparin subcutaneous  5,000 Units Subcutaneous Q8H  . insulin aspart  0-9 Units Subcutaneous TID WC  . levothyroxine  150 mcg Oral QAC breakfast  . metoprolol tartrate  25 mg Oral BID  . multivitamin  1 tablet Oral QHS  . nicotine  14 mg Transdermal Daily  . nicotine  7 mg Transdermal Daily  . pantoprazole  40 mg Oral Daily  . polyethylene glycol  17 g Oral BID  . senna  1 tablet Oral QPM  . sucralfate  1 g Oral BID  . tacrolimus  5 mg Oral BID   Continuous Infusions:  PRN Meds:.acetaminophen, dicyclomine, LORazepam, morphine injection, nitroGLYCERIN, promethazine, traMADol  Assessment/Plan: #Chest pain- Pt ruled out for ACS with three negative troponins. Cardiology saw patient and increased her lopressor to 25mg  BID and added amlodipine 5mg  daily. I spoke with cardiology PA-C today regarding the fact that patient becomes hypotensive during HD, therefore may not want to discharge on this new antihypertensive regimen. I will await cardiology MD recs prior to discharge. -plan to d/c today -continue ASA 81 mg daily  -pt not on statin likely because of her hx of liver transplant; will need to address this at f/u visit with PCP  #ESRD on HD- Phos high today. HD this AM. Nephrology increased goal dry weight today to 74.5kg from 73.5kg in order to prevent hypotension.  -renal diet  -midodrine with HD  #S/p liver transplant- in 2011. History of hepatitis C and cirrhosis 2/2 EtOH abuse. On tacrolimus  -tacrolimus level pending   #DM2- Last A1C 6.6% in 08/2012. On gabapentin 300 daily qhs.  -CBGs q4h   #HTN- Normotensive overnight, though this may be too low for patient to tolerate with HD. Will discuss antihypertensive regimen with cardiology prior to discharge.  #Hypothyroidism- Continue Synthroid 150 mcg daily.   #DVT PPX- heparin  #Code status- DNR (but does want intubation)  Dispo: Discharge today  The patient does have a current PCP Hal Morales, NP) and does not  need an [facility] hospital follow-up appointment after discharge.  The patient does not have transportation limitations that hinder transportation to clinic appointments.  .Services Needed at time of discharge: Y = Yes, Blank = No PT:   OT:   RN:   Equipment:   Other:     LOS: 2 days   Windell Hummingbird, MD 02/07/2013, 11:03 AM

## 2013-02-07 NOTE — Progress Notes (Signed)
[facility] Progress Note    Subjective: no further CP   Exam  Blood pressure 98/58, pulse 66, temperature 97.8 F (36.6 C), temperature source Oral, resp. rate 16, height 5' 3.5" (1.613 m), weight 75.6 kg (166 lb 10.7 oz), SpO2 100.00%. Alert, no distress No jvd Clear bilat, no rales or wheezing RRR 2/6 SEM Abd soft nt, nd No LE edema L thigh AVG +bruit  Dialysis: [facility] on TTS .  EDW 73.5kg HD Bath 2K/2.25Ca Time 4:00 Heparin NONE. Access L thigh AVG BFR 400 DFR A1.5  Hectorol 4 mcg IV/HD Epogen 1000 q wk Units IV/HD Venofer 50 q wk  Recent labs: Hgb 11.6, Tsat 36%, P 6.1, PTH 194   Assessment/Plan:  1. Chest Pain - neg recent cath x 2, no cath per cardiology. Associated with low BP's on HD. See below 2. ESRD - HD today 3. Hypertension/volume - SBPs 140s-170s. Per Cardiology, home dose of lopressor 12.5 mg BID increased to 25 BID and Amlodipine 5 mg added. Has also been on midodrine pre-hd as op, but we are holding that now. March up dry wt to try and stabilize BP's on HD. Patient notes tighter fitting clothes and is likely gaining body wt. Dry wt goal 74.5 kg today (was 73.5).  4. Anemia - Hgb 10.5 on weekly op Epo. Aranesp 40 ordered for here. Next Fe dose as outpatient.  5. Metabolic bone disease - Ca 8.5. Last op Phos 6.1. PTH within range. Did not like Velphoro. Ca carbonate is present binder per pt. Cont Vit D 6. DM - on insulin  7. Hx Liver transplant - 2011. On Progra 8. Dispo - d/c today, will increase edw.    Vinson Moselle MD  pager [phone]    cell [phone]  02/07/2013, 10:01 AM   Recent Labs Lab 02/05/13 1101 02/05/13 1812 02/06/13 0155 02/07/13 0534  NA 137  --  135 138  K 3.5  --  4.1 5.1  CL 96  --  94* 94*  CO2 27  --  29 29  GLUCOSE 261*  --  196* 146*  BUN 28*  --  39* 57*  CREATININE 5.74* 6.98* 7.83* 10.31*  CALCIUM 8.6  --  8.5 9.3  PHOS  --   --   --  8.3*    Recent Labs Lab 02/05/13 1101 02/07/13 0534  AST 26  --   ALT  15  --   ALKPHOS 70  --   BILITOT 0.3  --   PROT 6.9  --   ALBUMIN 3.0* 2.7*    Recent Labs Lab 02/05/13 1101 02/05/13 1812 02/06/13 0155  WBC 6.0 6.6 6.0  NEUTROABS 4.0  --   --   HGB 11.2* 11.9* 10.5*  HCT 32.7* 36.0 31.3*  MCV 95.3 96.8 96.3  PLT 135* 127* 121*   . amLODipine  5 mg Oral Daily  . aspirin EC  81 mg Oral Daily  . calcium carbonate  1,000 mg of elemental calcium Oral Q breakfast  . cinacalcet  30 mg Oral Q breakfast  . dextromethorphan-guaiFENesin  1 tablet Oral BID  . docusate sodium  100 mg Oral BID  . DULoxetine  60 mg Oral Daily  . gabapentin  300 mg Oral QHS  . heparin subcutaneous  5,000 Units Subcutaneous Q8H  . insulin aspart  0-9 Units Subcutaneous TID WC  . levothyroxine  150 mcg Oral QAC breakfast  . metoprolol tartrate  25 mg Oral BID  . multivitamin  1  tablet Oral QHS  . nicotine  14 mg Transdermal Daily  . nicotine  7 mg Transdermal Daily  . pantoprazole  40 mg Oral Daily  . polyethylene glycol  17 g Oral BID  . senna  1 tablet Oral QPM  . sucralfate  1 g Oral BID  . tacrolimus  5 mg Oral BID     acetaminophen, dicyclomine, LORazepam, morphine injection, nitroGLYCERIN, promethazine, traMADol

## 2013-02-07 NOTE — Progress Notes (Signed)
Patient is being transported to Dialysis, patient is stable and CMT notified. [name] RN

## 2013-02-07 NOTE — Progress Notes (Signed)
Patient's IV and telemetry has been discontinued, patient verbalizes understanding of discharge instructions and no further questions at this time.  Patient is being discharged home with family.  [name] RN

## 2013-02-07 NOTE — Progress Notes (Signed)
Patient is still off unit at Dialysis. [name] RN

## 2013-02-07 NOTE — Progress Notes (Signed)
Patient has returned to the unit, patient is stable and medications has been given; will continue to monitor patient. [name] RN

## 2013-02-07 NOTE — Discharge Summary (Signed)
Name: [name] MRN: [id number] DOB: 11/10/1955 57 y.o. PCP: Hal Morales, NP  Date of Admission: 02/05/2013  9:57 AM Date of Discharge: 02/07/2013 Attending Physician: Dr. Rogelia Boga  Discharge Diagnosis: Principal Problem:   Chest pain- atypical, ACS ruled out Active Problems:   HEPATITIS C- s/p liver transplant 2011   DIABETES MELLITUS, TYPE II   HTN- LVH, EF45- 50%. garde 2 diastolic dysf. 07/28/12   GERD   End stage renal disease on dialysis TTS   CAD- moderate with 40% - 50% LAD 07/29/12   Chronic combined systolic and diastolic congestive heart failure  Discharge Medications:   Medication List    STOP taking these medications       cefdinir 300 MG capsule  Commonly known as:  OMNICEF      TAKE these medications       albuterol 90 MCG/ACT inhaler  Commonly known as:  PROVENTIL,VENTOLIN  Inhale 2 puffs into the lungs every 6 (six) hours as needed for wheezing. For shortness of breath     aspirin EC 81 MG tablet  Take 81 mg by mouth daily.     CALCIUM CARBONATE PO  Take 500 mg by mouth daily.     cinacalcet 30 MG tablet  Commonly known as:  SENSIPAR  Take 30 mg by mouth daily.     dextromethorphan-guaiFENesin 30-600 MG per 12 hr tablet  Commonly known as:  MUCINEX DM  Take 1 tablet by mouth 2 (two) times daily.     dicyclomine 10 MG capsule  Commonly known as:  BENTYL  Take 10 mg by mouth every 6 (six) hours as needed (for stomach pain).     docusate sodium 100 MG capsule  Commonly known as:  COLACE  Take 100 mg by mouth 2 (two) times daily.     DULoxetine 60 MG capsule  Commonly known as:  CYMBALTA  Take 60 mg by mouth daily.     gabapentin 300 MG capsule  Commonly known as:  NEURONTIN  Take 300 mg by mouth at bedtime.     insulin detemir 100 UNIT/ML injection  Commonly known as:  LEVEMIR  Inject 5 Units into the skin at bedtime as needed. Uses only if blood sugar levels are over 150     levothyroxine 150 MCG tablet  Commonly known as:   SYNTHROID, LEVOTHROID  Take 150 mcg by mouth daily before breakfast.     LORazepam 0.5 MG tablet  Commonly known as:  ATIVAN  Take 0.5 mg by mouth daily as needed for anxiety.     metoprolol tartrate 25 MG tablet  Commonly known as:  LOPRESSOR  Take 12.5 mg by mouth 2 (two) times daily.     midodrine 10 MG tablet  Commonly known as:  PROAMATINE  Take 1 tablet (10 mg total) by mouth every dialysis.     multivitamin with minerals Tabs tablet  Take 1 tablet by mouth daily.     nicotine 7 mg/24hr patch  Commonly known as:  NICODERM CQ - dosed in mg/24 hr  Place 7 mg onto the skin daily.     nitroGLYCERIN 0.4 MG SL tablet  Commonly known as:  NITROSTAT  Place 0.4 mg under the tongue every 5 (five) minutes as needed for chest pain.     omeprazole 20 MG capsule  Commonly known as:  PRILOSEC  Take 20 mg by mouth daily.     polyethylene glycol packet  Commonly known as:  MIRALAX / GLYCOLAX  Take 17 g by mouth 2 (two) times daily.     promethazine 12.5 MG tablet  Commonly known as:  PHENERGAN  Take 12.5 mg by mouth every 6 (six) hours as needed for nausea or vomiting.     senna 8.6 MG Tabs tablet  Commonly known as:  SENOKOT  Take 1 tablet by mouth every evening.     sevelamer carbonate 800 MG tablet  Commonly known as:  RENVELA  Take 800 mg by mouth 3 (three) times daily with meals.     sucralfate 1 G tablet  Commonly known as:  CARAFATE  Take 1 g by mouth 2 (two) times daily.     tacrolimus 1 MG capsule  Commonly known as:  PROGRAF  Take 5 mg by mouth 2 (two) times daily.     traMADol 50 MG tablet  Commonly known as:  ULTRAM  Take by mouth every 6 (six) hours as needed.       Disposition and follow-up:   Ms.Viriginia F Ericsson was discharged from [facility] in Stable condition.  At the hospital follow up visit please address:  1.  Blood pressure- patient's lopressor was increased from 12.5mg  BID to 25mg  BID. Pt has episodes of hypotension with HD,  so please assess. 2.  HD- pt w/ new recommendations for increased goal dry weight to prevent hypotension. Please assess how this is going. 3. HLD- Pt would benefit from statin therapy, though with her history of liver transplantation, we did not start one as inpatient. Please address at PCP follow up.  2.  Labs / imaging needed at time of follow-up: consider 2D echo at follow up to reassess aortic valve  3.  Pending labs/ test needing follow-up: none  Follow-up Appointments: Follow-up Information   Follow up with Robbie Lis, PA-C On 02/21/2013. (10:00 am )    Specialty:  Cardiology   Contact information:   [address] [place] [address] [phone]       Follow up with Hal Morales, NP On 02/14/2013. (1:00 PM)    Specialty:  Nurse Practitioner   Contact information:   [address] [place] [address] [phone]       Discharge Instructions:  Future Appointments Provider Department Dept Phone   02/21/2013 10:00 AM Brittainy Delmer Islam [facility] [phone]      Consultations:   Arita Miss, MD (nephrology) Lennette Bihari, MD (cardiology)  Procedures Performed:  Dg Chest 2 View  02/05/2013   CLINICAL DATA:  Chest pain and pressure and nausea following acute dialysis today. The patient is a history of coronary artery disease and COPD.  EXAM: CHEST  2 VIEW  COMPARISON:  Portable chest x-ray of November 03, 2012.  FINDINGS: The lungs are adequately inflated. There is no focal infiltrate. There is minimal prominence of the pulmonary interstitial markings but these findings are much improved over the study of November 03, 2012. The cardiopericardial silhouette is normal in size. The pulmonary vascularity is not engorged. There is no pleural effusion or pneumothorax. The observed portions of the bony thorax appear normal.  IMPRESSION: There is no evidence of pneumonia nor CHF nor other acute cardiopulmonary abnormality.    Electronically Signed   By: David  [place]   On: 02/05/2013 12:31   Ct Head Wo Contrast  02/05/2013   CLINICAL DATA:  Headache  EXAM: CT HEAD WITHOUT CONTRAST  TECHNIQUE: Contiguous axial images were obtained from the base of the skull  through the vertex without intravenous contrast.  COMPARISON:  06/15/2012  FINDINGS: No mass effect, midline shift, or acute intracranial hemorrhage. Minimal chronic ischemic changes in the periventricular white matter.  IMPRESSION: No acute intracranial pathology.   Electronically Signed   By: Maryclare Bean M.D.   On: 02/05/2013 13:14   Admission HPI: Ms. Redmon is a 57 year old woman with PMH of hepatitis C, cirrhosis 2/2 EtOH abuse s/p liver transplant in 2011, ESRD on HD TTS ([facility] in [place]), DM2, HTN, CAD, GERD, COPD, hypothyroidism who presents with chest pain.   Patient states that 2.5-3 hours into her 4 hour HD session this morning, she developed left sided chest pain/pressure associated with nausea, shortness of breath, and "clamminess." Pain was 10/10, non-radiating (though she did report pain in her right arm), constant x 4 hours, exertional, relieved with NTG in ambulance. Chest pain now completely resolved. She reports recent history of chest pain with ambulation, usually lasting 30 minutes and relieved with NTG but today's episode was "much worse." She has a history of CAD with catheterization in 07/2012 (Dr. Tresa Endo) which showed 40-45% stenosis of LAD. Echo in 07/2012 showed EF 40-45% with reduced systolic function, grade 2 diastolic dysfunction, and moderate aortic stenosis.  On ROS, she also reports headache and fatigue. She takes tramadol at home for pain (no Tylenol, NSAIDs given liver transplant and ESRD).   In ED, patient had negative troponin x 2. EKG with no changes from prior. CXR negative. CT head (given headache and "drowziness") with no acute intracranial pathology.    Hospital Course by problem list: #Chest pain- [name] story was very convincing  for ACS so heparin drip was started. However, ACS was ruled out with three negative troponins, so heparin gtt  discontinued. TIMI score of 2 (8% risk at 14 days of all cause mortality, MI, severe ischemia). History of CAD with recent cardiac catheterization in 07/2012 which showed LAD 40-45% stenosis. EKG this admission w/o changes from prior. Cardiology was consulted and they wanted to increase lopressor to 25mg  BID and add amlodipine 5mg  daily given increased BPs (SBPs 170s). However, given patient has h/o hypotension with HD for which she takes midodrine w/ each HD session, we decided to discharge patient on the increased dose of lopressor, but not continue the amlodipine.  Continued ASA 81 mg daily. Pt not on statin likely because of her hx of liver transplant; will need to address this at f/u visit with PCP. Per cardiology, patient may benefit from outpatient 2D echo to re-evaluate her aortic valve as she does have h/o AS.  #ESRD on HD- K began increasing (5.1) and phos was elevated at 8.3, therefore she received HD on day of discharge. We did not give patient midodrine during her hospitalization, though we did restart this medication upon discharge to be taken only with each HD session to prevent hypotension. Nephrology recommended increasing her goal dry weight today to 74.5kg from 73.5kg in order to prevent hypotension.   #S/p liver transplant- Transplant in 2011. History of cirrhosis 2/2 EtOH abuse and hep C. On tacrolimus. Tacrolimus level pending.  #DM2- Last A1C 6.6% in 08/2012. On gabapentin 300 daily qhs. Patient managed on SSI. CBGs stable, 124-206 on day of discharge. Restarted home regimen at discharge (levemir 5U BID).   #HTN- BP slightly elevated during admission, 170s/80s. On metoprolol 12.5 mg BID at home, though cardiology increased this to 25mg  BID on HD 2.  Cardiology also added amlodipine 5mg  daily. However, given h/o pt becoming  hypotensive with HD sessions, we discharged her on the  new increased dose of metoprolol 25mg  BID, but did not continue the amlodipine. She will need her BP closely monitored.  #Hypothyroidism- Continued Synthroid 150 mcg daily.   Discharge Vitals:   BP 141/58  Pulse 68  Temp(Src) 97.4 F (36.3 C) (Oral)  Resp 18  Ht 5' 3.5" (1.613 m)  Wt 74.2 kg (163 lb 9.3 oz)  BMI 28.52 kg/m2  SpO2 100%  Discharge Labs:  Results for orders placed during the hospital encounter of 02/05/13 (from the past 24 hour(s))  GLUCOSE, CAPILLARY     Status: Abnormal   Collection Time    02/06/13  4:17 PM      Result Value Range   Glucose-Capillary 124 (*) 70 - 99 mg/dL   Comment 1 Notify RN    GLUCOSE, CAPILLARY     Status: Abnormal   Collection Time    02/06/13 10:11 PM      Result Value Range   Glucose-Capillary 206 (*) 70 - 99 mg/dL   Comment 1 Notify RN     Comment 2 Documented in Chart    RENAL FUNCTION PANEL     Status: Abnormal   Collection Time    02/07/13  5:34 AM      Result Value Range   Sodium 138  135 - 145 mEq/L   Potassium 5.1  3.5 - 5.1 mEq/L   Chloride 94 (*) 96 - 112 mEq/L   CO2 29  19 - 32 mEq/L   Glucose, Bld 146 (*) 70 - 99 mg/dL   BUN 57 (*) 6 - 23 mg/dL   Creatinine, Ser 45.40 (*) 0.50 - 1.10 mg/dL   Calcium 9.3  8.4 - 98.1 mg/dL   Phosphorus 8.3 (*) 2.3 - 4.6 mg/dL   Albumin 2.7 (*) 3.5 - 5.2 g/dL   GFR calc non Af Amer 4 (*) >90 mL/min   GFR calc Af Amer 4 (*) >90 mL/min  GLUCOSE, CAPILLARY     Status: Abnormal   Collection Time    02/07/13  6:29 AM      Result Value Range   Glucose-Capillary 137 (*) 70 - 99 mg/dL  GLUCOSE, CAPILLARY     Status: None   Collection Time    02/07/13 12:41 PM      Result Value Range   Glucose-Capillary 84  70 - 99 mg/dL    Signed: Windell Hummingbird, MD 02/07/2013, 1:28 PM   Time Spent on Discharge: 35 minutes Services Ordered on Discharge: none Equipment Ordered on Discharge: none

## 2013-02-07 NOTE — Progress Notes (Signed)
Subjective: At HD this am. Denies any chest pain or SOB.   Objective: Vital signs in last 24 hours: Temp:  [97.5 F (36.4 C)-98.3 F (36.8 C)] 98.3 F (36.8 C) (12/29 0451) Pulse Rate:  [65-73] 65 (12/29 0451) Resp:  [16-20] 16 (12/29 0451) BP: (125-176)/(53-80) 128/53 mmHg (12/29 0451) SpO2:  [99 %-100 %] 100 % (12/29 0451) Weight:  [75.569 kg (166 lb 9.6 oz)] 75.569 kg (166 lb 9.6 oz) (12/29 0451) Last BM Date: 02/05/13  Intake/Output from previous day: 12/28 0701 - 12/29 0700 In: 600 [P.O.:600] Out: -  Intake/Output this shift:    Medications Current Facility-Administered Medications  Medication Dose Route Frequency Provider Last Rate Last Dose  . acetaminophen (TYLENOL) tablet 650 mg  650 mg Oral Q4H PRN Christen Bame, MD      . amLODipine (NORVASC) tablet 5 mg  5 mg Oral Daily Markeya Mincy, PA-C   5 mg at 02/06/13 1815  . aspirin EC tablet 81 mg  81 mg Oral Daily Christen Bame, MD   81 mg at 02/06/13 1102  . calcium carbonate (OS-CAL - dosed in mg of elemental calcium) tablet 1,000 mg of elemental calcium  1,000 mg of elemental calcium Oral Q breakfast Kerin Salen, PA-C      . cinacalcet [facility]) tablet 30 mg  30 mg Oral Q breakfast Christen Bame, MD   30 mg at 02/06/13 0908  . darbepoetin (ARANESP) injection 40 mcg  40 mcg Intravenous Q Mon-HD Kerin Salen, PA-C      . dextromethorphan-guaiFENesin [facility] DM) 30-600 MG per 12 hr tablet 1 tablet  1 tablet Oral BID Christen Bame, MD   1 tablet at 02/06/13 2203  . dicyclomine (BENTYL) capsule 10 mg  10 mg Oral Q6H PRN Christen Bame, MD      . docusate sodium (COLACE) capsule 100 mg  100 mg Oral BID Christen Bame, MD   100 mg at 02/06/13 2203  . DULoxetine (CYMBALTA) DR capsule 60 mg  60 mg Oral Daily Christen Bame, MD   60 mg at 02/06/13 1102  . gabapentin (NEURONTIN) capsule 300 mg  300 mg Oral QHS Christen Bame, MD   300 mg at 02/06/13 2206  . heparin injection 5,000 Units  5,000 Units Subcutaneous Q8H Windell Hummingbird, MD    5,000 Units at 02/07/13 [phone]  . insulin aspart (novoLOG) injection 0-9 Units  0-9 Units Subcutaneous TID WC Christen Bame, MD   1 Units at 02/07/13 [phone]  . levothyroxine (SYNTHROID, LEVOTHROID) tablet 150 mcg  150 mcg Oral QAC breakfast Christen Bame, MD      . LORazepam (ATIVAN) tablet 0.5 mg  0.5 mg Oral Daily PRN Christen Bame, MD      . metoprolol tartrate (LOPRESSOR) tablet 25 mg  25 mg Oral BID Guliana Weyandt, PA-C   25 mg at 02/06/13 2202  . morphine 2 MG/ML injection 2 mg  2 mg Intravenous Q2H PRN Christen Bame, MD      . multivitamin (RENA-VIT) tablet 1 tablet  1 tablet Oral QHS Kerin Salen, PA-C   1 tablet at 02/06/13 2205  . nicotine (NICODERM CQ - dosed in mg/24 hours) patch 14 mg  14 mg Transdermal Daily Christen Bame, MD   14 mg at 02/06/13 1000  . nicotine (NICODERM CQ - dosed in mg/24 hr) patch 7 mg  7 mg Transdermal Daily Christen Bame, MD   7 mg at 02/06/13 1055  . nitroGLYCERIN (NITROSTAT) SL tablet 0.4 mg  0.4  mg Sublingual Q5 min PRN Christen Bame, MD      . pantoprazole (PROTONIX) EC tablet 40 mg  40 mg Oral Daily Christen Bame, MD   40 mg at 02/06/13 1055  . polyethylene glycol (MIRALAX / GLYCOLAX) packet 17 g  17 g Oral BID Christen Bame, MD   17 g at 02/06/13 2200  . promethazine (PHENERGAN) tablet 12.5 mg  12.5 mg Oral Q6H PRN Christen Bame, MD   12.5 mg at 02/06/13 1344  . senna (SENOKOT) tablet 8.6 mg  1 tablet Oral QPM Christen Bame, MD   8.6 mg at 02/06/13 2202  . sucralfate (CARAFATE) tablet 1 g  1 g Oral BID Christen Bame, MD   1 g at 02/06/13 2202  . tacrolimus (PROGRAF) capsule 5 mg  5 mg Oral BID Christen Bame, MD   5 mg at 02/06/13 2202  . traMADol (ULTRAM) tablet 50 mg  50 mg Oral Q6H PRN Christen Bame, MD   50 mg at 02/05/13 2139    PE: General appearance: alert, cooperative and no distress Lungs: clear to auscultation bilaterally Heart: regular rate and rhythm and 2/6 SM Extremities: no LEE Pulses: 2+ and symmetric Skin: warm and dry Neurologic: Grossly normal  Lab Results:   Recent  Labs  02/05/13 1101 02/05/13 1812 02/06/13 0155  WBC 6.0 6.6 6.0  HGB 11.2* 11.9* 10.5*  HCT 32.7* 36.0 31.3*  PLT 135* 127* 121*   BMET  Recent Labs  02/05/13 1101 02/05/13 1812 02/06/13 0155 02/07/13 0534  NA 137  --  135 138  K 3.5  --  4.1 5.1  CL 96  --  94* 94*  CO2 27  --  29 29  GLUCOSE 261*  --  196* 146*  BUN 28*  --  39* 57*  CREATININE 5.74* 6.98* 7.83* 10.31*  CALCIUM 8.6  --  8.5 9.3     Assessment/Plan  Principal Problem:   Chest pain Active Problems:   HEPATITIS C- s/p liver transplant 2011   DIABETES MELLITUS, TYPE II   HTN- LVH, EF45- 50%. garde 2 diastolic dysf. 07/28/12   GERD   End stage renal disease on dialysis   CAD- moderate with 40% - 50% LAD 07/29/12   Chronic combined systolic and diastolic congestive heart failure   Plan:  Enzymes negative x 3. Meds titrated. Lopressor was increased to 25 mg BID and Amlodipine was added yesterday. Tolerating well. BP is better controlled at 128/53. SBP was in the 170s yesterday. HR stable. No further CP. Stable for discharge from a cardiac standpoint. Will arrange post-hospital f/u in our office.     LOS: 2 days    Itha Kroeker M. Delmer Islam 02/07/2013 8:17 AM

## 2013-02-07 NOTE — Progress Notes (Addendum)
Pt. Seen and examined. Agree with the NP/PA-C note as written.  Feels well today. BP much improved on amlodipine 5 mg in addition to metoprolol. Would continue these at discharge. Try to avoid midodrine if possible - I don't think there should be a reason to use this to support dialysis - would ask nephrology to try to pull slower at dialysis or could consider holding amlodipine on dialysis days to reduce risk of hypotension. Plan for discharge today. Can follow-up with MLP or Dr. Allyson Sabal in 7-10 days.  Chrystie Nose, MD, [facility] Attending Cardiologist [facility]

## 2013-02-09 DIAGNOSIS — N186 End stage renal disease: Secondary | ICD-10-CM | POA: Diagnosis not present

## 2013-02-10 DIAGNOSIS — I2510 Atherosclerotic heart disease of native coronary artery without angina pectoris: Secondary | ICD-10-CM

## 2013-02-10 HISTORY — DX: Atherosclerotic heart disease of native coronary artery without angina pectoris: I25.10

## 2013-02-12 DIAGNOSIS — N2581 Secondary hyperparathyroidism of renal origin: Secondary | ICD-10-CM | POA: Diagnosis not present

## 2013-02-12 DIAGNOSIS — N186 End stage renal disease: Secondary | ICD-10-CM | POA: Diagnosis not present

## 2013-02-12 DIAGNOSIS — D509 Iron deficiency anemia, unspecified: Secondary | ICD-10-CM | POA: Diagnosis not present

## 2013-02-12 DIAGNOSIS — D631 Anemia in chronic kidney disease: Secondary | ICD-10-CM | POA: Diagnosis not present

## 2013-02-14 DIAGNOSIS — F17200 Nicotine dependence, unspecified, uncomplicated: Secondary | ICD-10-CM | POA: Diagnosis not present

## 2013-02-14 DIAGNOSIS — I10 Essential (primary) hypertension: Secondary | ICD-10-CM | POA: Diagnosis not present

## 2013-02-14 DIAGNOSIS — R079 Chest pain, unspecified: Secondary | ICD-10-CM | POA: Diagnosis not present

## 2013-02-14 DIAGNOSIS — R011 Cardiac murmur, unspecified: Secondary | ICD-10-CM | POA: Diagnosis not present

## 2013-02-15 DIAGNOSIS — D509 Iron deficiency anemia, unspecified: Secondary | ICD-10-CM | POA: Diagnosis not present

## 2013-02-15 DIAGNOSIS — N186 End stage renal disease: Secondary | ICD-10-CM | POA: Diagnosis not present

## 2013-02-15 DIAGNOSIS — N2581 Secondary hyperparathyroidism of renal origin: Secondary | ICD-10-CM | POA: Diagnosis not present

## 2013-02-15 DIAGNOSIS — D631 Anemia in chronic kidney disease: Secondary | ICD-10-CM | POA: Diagnosis not present

## 2013-02-15 DIAGNOSIS — N039 Chronic nephritic syndrome with unspecified morphologic changes: Secondary | ICD-10-CM | POA: Diagnosis not present

## 2013-02-17 DIAGNOSIS — N2581 Secondary hyperparathyroidism of renal origin: Secondary | ICD-10-CM | POA: Diagnosis not present

## 2013-02-17 DIAGNOSIS — D631 Anemia in chronic kidney disease: Secondary | ICD-10-CM | POA: Diagnosis not present

## 2013-02-17 DIAGNOSIS — D509 Iron deficiency anemia, unspecified: Secondary | ICD-10-CM | POA: Diagnosis not present

## 2013-02-17 DIAGNOSIS — N039 Chronic nephritic syndrome with unspecified morphologic changes: Secondary | ICD-10-CM | POA: Diagnosis not present

## 2013-02-17 DIAGNOSIS — N186 End stage renal disease: Secondary | ICD-10-CM | POA: Diagnosis not present

## 2013-02-19 DIAGNOSIS — D509 Iron deficiency anemia, unspecified: Secondary | ICD-10-CM | POA: Diagnosis not present

## 2013-02-19 DIAGNOSIS — N186 End stage renal disease: Secondary | ICD-10-CM | POA: Diagnosis not present

## 2013-02-19 DIAGNOSIS — N2581 Secondary hyperparathyroidism of renal origin: Secondary | ICD-10-CM | POA: Diagnosis not present

## 2013-02-19 DIAGNOSIS — D631 Anemia in chronic kidney disease: Secondary | ICD-10-CM | POA: Diagnosis not present

## 2013-02-21 ENCOUNTER — Inpatient Hospital Stay (HOSPITAL_COMMUNITY)
Admission: EM | Admit: 2013-02-21 | Discharge: 2013-02-23 | DRG: 193 | Disposition: A | Discharge status: Home / Self Care | Payer: Medicare Other | Attending: Internal Medicine | Admitting: Internal Medicine

## 2013-02-21 ENCOUNTER — Ambulatory Visit: Payer: Medicare Other | Admitting: Cardiology

## 2013-02-21 ENCOUNTER — Encounter (HOSPITAL_COMMUNITY): Payer: Self-pay | Admitting: Emergency Medicine

## 2013-02-21 ENCOUNTER — Emergency Department (HOSPITAL_COMMUNITY): Payer: Medicare Other

## 2013-02-21 DIAGNOSIS — Z7982 Long term (current) use of aspirin: Secondary | ICD-10-CM

## 2013-02-21 DIAGNOSIS — M129 Arthropathy, unspecified: Secondary | ICD-10-CM | POA: Diagnosis present

## 2013-02-21 DIAGNOSIS — K5900 Constipation, unspecified: Secondary | ICD-10-CM | POA: Diagnosis present

## 2013-02-21 DIAGNOSIS — D649 Anemia, unspecified: Secondary | ICD-10-CM | POA: Diagnosis present

## 2013-02-21 DIAGNOSIS — I359 Nonrheumatic aortic valve disorder, unspecified: Secondary | ICD-10-CM | POA: Diagnosis not present

## 2013-02-21 DIAGNOSIS — J441 Chronic obstructive pulmonary disease with (acute) exacerbation: Secondary | ICD-10-CM

## 2013-02-21 DIAGNOSIS — I5042 Chronic combined systolic (congestive) and diastolic (congestive) heart failure: Secondary | ICD-10-CM

## 2013-02-21 DIAGNOSIS — J189 Pneumonia, unspecified organism: Principal | ICD-10-CM

## 2013-02-21 DIAGNOSIS — B1710 Acute hepatitis C without hepatic coma: Secondary | ICD-10-CM | POA: Diagnosis not present

## 2013-02-21 DIAGNOSIS — F17200 Nicotine dependence, unspecified, uncomplicated: Secondary | ICD-10-CM | POA: Diagnosis present

## 2013-02-21 DIAGNOSIS — R0902 Hypoxemia: Secondary | ICD-10-CM

## 2013-02-21 DIAGNOSIS — F3289 Other specified depressive episodes: Secondary | ICD-10-CM | POA: Diagnosis present

## 2013-02-21 DIAGNOSIS — R079 Chest pain, unspecified: Secondary | ICD-10-CM

## 2013-02-21 DIAGNOSIS — N186 End stage renal disease: Secondary | ICD-10-CM | POA: Diagnosis present

## 2013-02-21 DIAGNOSIS — E119 Type 2 diabetes mellitus without complications: Secondary | ICD-10-CM

## 2013-02-21 DIAGNOSIS — I120 Hypertensive chronic kidney disease with stage 5 chronic kidney disease or end stage renal disease: Secondary | ICD-10-CM | POA: Diagnosis not present

## 2013-02-21 DIAGNOSIS — J9600 Acute respiratory failure, unspecified whether with hypoxia or hypercapnia: Secondary | ICD-10-CM | POA: Diagnosis not present

## 2013-02-21 DIAGNOSIS — Z992 Dependence on renal dialysis: Secondary | ICD-10-CM | POA: Diagnosis not present

## 2013-02-21 DIAGNOSIS — K219 Gastro-esophageal reflux disease without esophagitis: Secondary | ICD-10-CM | POA: Diagnosis present

## 2013-02-21 DIAGNOSIS — I350 Nonrheumatic aortic (valve) stenosis: Secondary | ICD-10-CM

## 2013-02-21 DIAGNOSIS — I2510 Atherosclerotic heart disease of native coronary artery without angina pectoris: Secondary | ICD-10-CM

## 2013-02-21 DIAGNOSIS — I5023 Acute on chronic systolic (congestive) heart failure: Secondary | ICD-10-CM

## 2013-02-21 DIAGNOSIS — J90 Pleural effusion, not elsewhere classified: Secondary | ICD-10-CM | POA: Diagnosis not present

## 2013-02-21 DIAGNOSIS — B1920 Unspecified viral hepatitis C without hepatic coma: Secondary | ICD-10-CM | POA: Diagnosis present

## 2013-02-21 DIAGNOSIS — J440 Chronic obstructive pulmonary disease with acute lower respiratory infection: Secondary | ICD-10-CM | POA: Diagnosis present

## 2013-02-21 DIAGNOSIS — Z944 Liver transplant status: Secondary | ICD-10-CM

## 2013-02-21 DIAGNOSIS — F329 Major depressive disorder, single episode, unspecified: Secondary | ICD-10-CM | POA: Diagnosis present

## 2013-02-21 DIAGNOSIS — I509 Heart failure, unspecified: Secondary | ICD-10-CM | POA: Diagnosis present

## 2013-02-21 DIAGNOSIS — E785 Hyperlipidemia, unspecified: Secondary | ICD-10-CM

## 2013-02-21 DIAGNOSIS — J209 Acute bronchitis, unspecified: Secondary | ICD-10-CM | POA: Diagnosis present

## 2013-02-21 DIAGNOSIS — R0602 Shortness of breath: Secondary | ICD-10-CM | POA: Diagnosis not present

## 2013-02-21 DIAGNOSIS — N2581 Secondary hyperparathyroidism of renal origin: Secondary | ICD-10-CM | POA: Diagnosis present

## 2013-02-21 DIAGNOSIS — F411 Generalized anxiety disorder: Secondary | ICD-10-CM | POA: Diagnosis present

## 2013-02-21 DIAGNOSIS — E1122 Type 2 diabetes mellitus with diabetic chronic kidney disease: Secondary | ICD-10-CM | POA: Diagnosis present

## 2013-02-21 DIAGNOSIS — J810 Acute pulmonary edema: Secondary | ICD-10-CM

## 2013-02-21 DIAGNOSIS — R9431 Abnormal electrocardiogram [ECG] [EKG]: Secondary | ICD-10-CM

## 2013-02-21 DIAGNOSIS — J449 Chronic obstructive pulmonary disease, unspecified: Secondary | ICD-10-CM | POA: Diagnosis not present

## 2013-02-21 DIAGNOSIS — E669 Obesity, unspecified: Secondary | ICD-10-CM | POA: Diagnosis present

## 2013-02-21 DIAGNOSIS — E039 Hypothyroidism, unspecified: Secondary | ICD-10-CM | POA: Diagnosis present

## 2013-02-21 DIAGNOSIS — I739 Peripheral vascular disease, unspecified: Secondary | ICD-10-CM | POA: Diagnosis present

## 2013-02-21 DIAGNOSIS — D696 Thrombocytopenia, unspecified: Secondary | ICD-10-CM | POA: Diagnosis not present

## 2013-02-21 DIAGNOSIS — D539 Nutritional anemia, unspecified: Secondary | ICD-10-CM

## 2013-02-21 DIAGNOSIS — Z952 Presence of prosthetic heart valve: Secondary | ICD-10-CM | POA: Diagnosis present

## 2013-02-21 DIAGNOSIS — J4489 Other specified chronic obstructive pulmonary disease: Secondary | ICD-10-CM | POA: Diagnosis present

## 2013-02-21 DIAGNOSIS — Z794 Long term (current) use of insulin: Secondary | ICD-10-CM

## 2013-02-21 DIAGNOSIS — I10 Essential (primary) hypertension: Secondary | ICD-10-CM

## 2013-02-21 HISTORY — DX: Liver transplant status: Z94.4

## 2013-02-21 LAB — CBC WITH DIFFERENTIAL/PLATELET
BASOS ABS: 0 10*3/uL (ref 0.0–0.1)
Basophils Relative: 0 % (ref 0–1)
EOS ABS: 0.1 10*3/uL (ref 0.0–0.7)
Eosinophils Relative: 1 % (ref 0–5)
HCT: 34.8 % — ABNORMAL LOW (ref 36.0–46.0)
HEMOGLOBIN: 11.5 g/dL — AB (ref 12.0–15.0)
Lymphocytes Relative: 11 % — ABNORMAL LOW (ref 12–46)
Lymphs Abs: 1 10*3/uL (ref 0.7–4.0)
MCH: 32.3 pg (ref 26.0–34.0)
MCHC: 33 g/dL (ref 30.0–36.0)
MCV: 97.8 fL (ref 78.0–100.0)
MONOS PCT: 5 % (ref 3–12)
Monocytes Absolute: 0.4 10*3/uL (ref 0.1–1.0)
NEUTROS ABS: 7.4 10*3/uL (ref 1.7–7.7)
Neutrophils Relative %: 84 % — ABNORMAL HIGH (ref 43–77)
PLATELETS: 117 10*3/uL — AB (ref 150–400)
RBC: 3.56 MIL/uL — ABNORMAL LOW (ref 3.87–5.11)
RDW: 14.3 % (ref 11.5–15.5)
WBC: 8.9 10*3/uL (ref 4.0–10.5)

## 2013-02-21 LAB — INFLUENZA PANEL BY PCR (TYPE A & B)
H1N1 flu by pcr: NOT DETECTED
Influenza A By PCR: NEGATIVE
Influenza B By PCR: NEGATIVE

## 2013-02-21 LAB — POCT I-STAT, CHEM 8
BUN: 41 mg/dL — ABNORMAL HIGH (ref 6–23)
Calcium, Ion: 1.03 mmol/L — ABNORMAL LOW (ref 1.12–1.23)
Chloride: 100 mEq/L (ref 96–112)
Creatinine, Ser: 8.5 mg/dL — ABNORMAL HIGH (ref 0.50–1.10)
GLUCOSE: 197 mg/dL — AB (ref 70–99)
HEMATOCRIT: 37 % (ref 36.0–46.0)
HEMOGLOBIN: 12.6 g/dL (ref 12.0–15.0)
POTASSIUM: 4.2 meq/L (ref 3.7–5.3)
Sodium: 139 mEq/L (ref 137–147)
TCO2: 26 mmol/L (ref 0–100)

## 2013-02-21 LAB — POCT I-STAT TROPONIN I: TROPONIN I, POC: 0.04 ng/mL (ref 0.00–0.08)

## 2013-02-21 LAB — GLUCOSE, CAPILLARY
GLUCOSE-CAPILLARY: 391 mg/dL — AB (ref 70–99)
GLUCOSE-CAPILLARY: 434 mg/dL — AB (ref 70–99)

## 2013-02-21 MED ORDER — ASPIRIN EC 81 MG PO TBEC
81.0000 mg | DELAYED_RELEASE_TABLET | Freq: Every day | ORAL | Status: DC
  Administered 2013-02-21 – 2013-02-23 (×3): 81 mg via ORAL
  Filled 2013-02-21 (×3): qty 1

## 2013-02-21 MED ORDER — DULOXETINE HCL 60 MG PO CPEP
60.0000 mg | ORAL_CAPSULE | Freq: Every day | ORAL | Status: DC
  Administered 2013-02-21: 60 mg via ORAL
  Filled 2013-02-21 (×3): qty 1

## 2013-02-21 MED ORDER — ALBUTEROL SULFATE (2.5 MG/3ML) 0.083% IN NEBU
5.0000 mg | INHALATION_SOLUTION | Freq: Once | RESPIRATORY_TRACT | Status: AC
  Administered 2013-02-21: 5 mg via RESPIRATORY_TRACT
  Filled 2013-02-21: qty 6

## 2013-02-21 MED ORDER — SEVELAMER CARBONATE 800 MG PO TABS
800.0000 mg | ORAL_TABLET | Freq: Three times a day (TID) | ORAL | Status: DC
  Filled 2013-02-21 (×4): qty 1

## 2013-02-21 MED ORDER — PIPERACILLIN-TAZOBACTAM 3.375 G IVPB 30 MIN
3.3750 g | Freq: Once | INTRAVENOUS | Status: AC
  Administered 2013-02-21: 3.375 g via INTRAVENOUS
  Filled 2013-02-21: qty 50

## 2013-02-21 MED ORDER — LORAZEPAM 0.5 MG PO TABS
0.5000 mg | ORAL_TABLET | Freq: Every day | ORAL | Status: DC | PRN
  Administered 2013-02-22: 0.5 mg via ORAL
  Filled 2013-02-21 (×2): qty 1

## 2013-02-21 MED ORDER — LEVOTHYROXINE SODIUM 150 MCG PO TABS
150.0000 ug | ORAL_TABLET | Freq: Every day | ORAL | Status: DC
  Administered 2013-02-23: 150 ug via ORAL
  Filled 2013-02-21 (×3): qty 1

## 2013-02-21 MED ORDER — SODIUM CHLORIDE 0.9 % IJ SOLN
3.0000 mL | Freq: Two times a day (BID) | INTRAMUSCULAR | Status: DC
  Administered 2013-02-21 – 2013-02-22 (×2): 3 mL via INTRAVENOUS
  Administered 2013-02-23: 12:00:00 via INTRAVENOUS

## 2013-02-21 MED ORDER — SODIUM CHLORIDE 0.9 % IV SOLN: 100.0000 mL | INTRAVENOUS | Status: DC | PRN

## 2013-02-21 MED ORDER — SUCRALFATE 1 G PO TABS
1.0000 g | ORAL_TABLET | Freq: Two times a day (BID) | ORAL | Status: DC
  Administered 2013-02-21 – 2013-02-23 (×4): 1 g via ORAL
  Filled 2013-02-21 (×5): qty 1

## 2013-02-21 MED ORDER — ALBUTEROL 90 MCG/ACT IN AERS: 2.0000 | INHALATION_SPRAY | Freq: Four times a day (QID) | RESPIRATORY_TRACT | Status: DC | PRN

## 2013-02-21 MED ORDER — HEPARIN SODIUM (PORCINE) 5000 UNIT/ML IJ SOLN
5000.0000 [IU] | Freq: Three times a day (TID) | INTRAMUSCULAR | Status: DC
  Administered 2013-02-21 – 2013-02-23 (×4): 5000 [IU] via SUBCUTANEOUS
  Filled 2013-02-21 (×8): qty 1

## 2013-02-21 MED ORDER — INSULIN ASPART 100 UNIT/ML ~~LOC~~ SOLN
0.0000 [IU] | Freq: Three times a day (TID) | SUBCUTANEOUS | Status: DC
  Administered 2013-02-22: 7 [IU] via SUBCUTANEOUS
  Administered 2013-02-22 – 2013-02-23 (×3): 3 [IU] via SUBCUTANEOUS
  Administered 2013-02-23: 1 [IU] via SUBCUTANEOUS

## 2013-02-21 MED ORDER — VANCOMYCIN HCL 10 G IV SOLR
1500.0000 mg | INTRAVENOUS | Status: AC
  Administered 2013-02-21: 1500 mg via INTRAVENOUS
  Filled 2013-02-21: qty 1500

## 2013-02-21 MED ORDER — GUAIFENESIN-DM 100-10 MG/5ML PO SYRP
5.0000 mL | ORAL_SOLUTION | ORAL | Status: DC | PRN
  Administered 2013-02-22: 5 mL via ORAL
  Filled 2013-02-21 (×2): qty 5

## 2013-02-21 MED ORDER — INSULIN DETEMIR 100 UNIT/ML ~~LOC~~ SOLN
5.0000 [IU] | Freq: Every day | SUBCUTANEOUS | Status: DC
  Administered 2013-02-21 – 2013-02-22 (×2): 5 [IU] via SUBCUTANEOUS
  Filled 2013-02-21 (×3): qty 0.05

## 2013-02-21 MED ORDER — SODIUM CHLORIDE 0.9 % IV SOLN
100.0000 mL | INTRAVENOUS | Status: DC | PRN
Start: 2013-02-21 — End: 2013-02-22

## 2013-02-21 MED ORDER — ALBUTEROL SULFATE (2.5 MG/3ML) 0.083% IN NEBU
2.5000 mg | INHALATION_SOLUTION | Freq: Four times a day (QID) | RESPIRATORY_TRACT | Status: DC
  Administered 2013-02-21: 2.5 mg via RESPIRATORY_TRACT
  Filled 2013-02-21: qty 3

## 2013-02-21 MED ORDER — TACROLIMUS 1 MG PO CAPS
5.0000 mg | ORAL_CAPSULE | Freq: Two times a day (BID) | ORAL | Status: DC
  Administered 2013-02-21 – 2013-02-23 (×4): 5 mg via ORAL
  Filled 2013-02-21 (×5): qty 5

## 2013-02-21 MED ORDER — NICOTINE 7 MG/24HR TD PT24
7.0000 mg | MEDICATED_PATCH | Freq: Every day | TRANSDERMAL | Status: DC
  Administered 2013-02-21 – 2013-02-23 (×3): 7 mg via TRANSDERMAL
  Filled 2013-02-21 (×3): qty 1

## 2013-02-21 MED ORDER — PENTAFLUOROPROP-TETRAFLUOROETH EX AERO: 1.0000 "application " | INHALATION_SPRAY | CUTANEOUS | Status: DC | PRN

## 2013-02-21 MED ORDER — PANTOPRAZOLE SODIUM 40 MG PO TBEC
40.0000 mg | DELAYED_RELEASE_TABLET | Freq: Every day | ORAL | Status: DC
  Administered 2013-02-22 – 2013-02-23 (×2): 40 mg via ORAL
  Filled 2013-02-21 (×2): qty 1

## 2013-02-21 MED ORDER — ALBUTEROL SULFATE (2.5 MG/3ML) 0.083% IN NEBU: 2.5000 mg | INHALATION_SOLUTION | Freq: Four times a day (QID) | RESPIRATORY_TRACT | Status: DC | PRN

## 2013-02-21 MED ORDER — OXYCODONE HCL 5 MG PO TABS
5.0000 mg | ORAL_TABLET | ORAL | Status: DC | PRN
  Administered 2013-02-21 – 2013-02-22 (×3): 5 mg via ORAL
  Filled 2013-02-21 (×3): qty 1

## 2013-02-21 MED ORDER — POLYETHYLENE GLYCOL 3350 17 G PO PACK
17.0000 g | PACK | Freq: Two times a day (BID) | ORAL | Status: DC
  Administered 2013-02-22 – 2013-02-23 (×3): 17 g via ORAL
  Filled 2013-02-21 (×5): qty 1

## 2013-02-21 MED ORDER — ONDANSETRON HCL 4 MG/2ML IJ SOLN
4.0000 mg | Freq: Four times a day (QID) | INTRAMUSCULAR | Status: DC | PRN
  Administered 2013-02-23: 4 mg via INTRAVENOUS
  Filled 2013-02-21: qty 2

## 2013-02-21 MED ORDER — METOPROLOL TARTRATE 25 MG PO TABS
25.0000 mg | ORAL_TABLET | Freq: Two times a day (BID) | ORAL | Status: DC
  Administered 2013-02-21 – 2013-02-23 (×4): 25 mg via ORAL
  Filled 2013-02-21 (×5): qty 1

## 2013-02-21 MED ORDER — ONDANSETRON HCL 4 MG PO TABS: 4.0000 mg | ORAL_TABLET | Freq: Four times a day (QID) | ORAL | Status: DC | PRN

## 2013-02-21 MED ORDER — PIPERACILLIN-TAZOBACTAM IN DEX 2-0.25 GM/50ML IV SOLN
2.2500 g | Freq: Three times a day (TID) | INTRAVENOUS | Status: DC
Start: 2013-02-22 — End: 2013-02-23
  Administered 2013-02-21 – 2013-02-23 (×4): 2.25 g via INTRAVENOUS
  Filled 2013-02-21 (×8): qty 50

## 2013-02-21 MED ORDER — LIDOCAINE-PRILOCAINE 2.5-2.5 % EX CREA: 1.0000 "application " | TOPICAL_CREAM | CUTANEOUS | Status: DC | PRN

## 2013-02-21 MED ORDER — ADULT MULTIVITAMIN W/MINERALS CH
1.0000 | ORAL_TABLET | Freq: Every day | ORAL | Status: DC
  Administered 2013-02-21 – 2013-02-23 (×3): 1 via ORAL
  Filled 2013-02-21 (×3): qty 1

## 2013-02-21 MED ORDER — ONDANSETRON HCL 4 MG/2ML IJ SOLN
4.0000 mg | Freq: Once | INTRAMUSCULAR | Status: AC
  Administered 2013-02-21: 4 mg via INTRAVENOUS
  Filled 2013-02-21: qty 2

## 2013-02-21 MED ORDER — ALBUTEROL SULFATE (2.5 MG/3ML) 0.083% IN NEBU
2.5000 mg | INHALATION_SOLUTION | Freq: Three times a day (TID) | RESPIRATORY_TRACT | Status: DC
  Administered 2013-02-22 (×3): 2.5 mg via RESPIRATORY_TRACT
  Filled 2013-02-21 (×3): qty 3

## 2013-02-21 MED ORDER — INSULIN ASPART 100 UNIT/ML ~~LOC~~ SOLN
8.0000 [IU] | Freq: Once | SUBCUTANEOUS | Status: AC
  Administered 2013-02-21: 8 [IU] via SUBCUTANEOUS

## 2013-02-21 MED ORDER — GUAIFENESIN ER 600 MG PO TB12
1200.0000 mg | ORAL_TABLET | Freq: Two times a day (BID) | ORAL | Status: DC
  Administered 2013-02-21 – 2013-02-23 (×4): 1200 mg via ORAL
  Filled 2013-02-21 (×5): qty 2

## 2013-02-21 MED ORDER — HEPARIN SODIUM (PORCINE) 1000 UNIT/ML DIALYSIS
1000.0000 [IU] | INTRAMUSCULAR | Status: DC | PRN
  Filled 2013-02-21: qty 1

## 2013-02-21 MED ORDER — VANCOMYCIN HCL IN DEXTROSE 750-5 MG/150ML-% IV SOLN
750.0000 mg | INTRAVENOUS | Status: DC
  Administered 2013-02-22: 750 mg via INTRAVENOUS
  Filled 2013-02-21 (×2): qty 150

## 2013-02-21 MED ORDER — ALTEPLASE 2 MG IJ SOLR
2.0000 mg | Freq: Once | INTRAMUSCULAR | Status: AC | PRN
  Filled 2013-02-21: qty 2

## 2013-02-21 MED ORDER — CINACALCET HCL 30 MG PO TABS
30.0000 mg | ORAL_TABLET | Freq: Every day | ORAL | Status: DC
  Administered 2013-02-23: 30 mg via ORAL
  Filled 2013-02-21 (×3): qty 1

## 2013-02-21 MED ORDER — SODIUM CHLORIDE 0.9 % IV SOLN
125.0000 mg | INTRAVENOUS | Status: DC
  Administered 2013-02-22: 125 mg via INTRAVENOUS
  Filled 2013-02-21 (×2): qty 10

## 2013-02-21 MED ORDER — NITROGLYCERIN 0.4 MG SL SUBL: 0.4000 mg | SUBLINGUAL_TABLET | SUBLINGUAL | Status: DC | PRN

## 2013-02-21 MED ORDER — NEPRO/CARBSTEADY PO LIQD: 237.0000 mL | ORAL | Status: DC | PRN

## 2013-02-21 MED ORDER — LIDOCAINE HCL (PF) 1 % IJ SOLN: 5.0000 mL | INTRAMUSCULAR | Status: DC | PRN

## 2013-02-21 MED ORDER — DOXERCALCIFEROL 4 MCG/2ML IV SOLN
4.0000 ug | INTRAVENOUS | Status: DC
  Administered 2013-02-22: 4 ug via INTRAVENOUS
  Filled 2013-02-21: qty 2

## 2013-02-21 MED ORDER — METHYLPREDNISOLONE SODIUM SUCC 125 MG IJ SOLR
125.0000 mg | Freq: Once | INTRAMUSCULAR | Status: AC
  Administered 2013-02-21: 125 mg via INTRAVENOUS
  Filled 2013-02-21: qty 2

## 2013-02-21 NOTE — ED Notes (Signed)
Ambulating patient on room air with pulse oximetry the patient's SPO2 level drops to 88-89 and is very dizzy. Only able to ambulate a few steps.

## 2013-02-21 NOTE — Consult Note (Signed)
[facility] Renal Consultation Note    Indication for Consultation:  Management of ESRD/hemodialysis; anemia, hypertension/volume and secondary hyperparathyroidism  HPI: [name] is a 58 y.o. female ESRD patient (TTS HD) with multiple medical problems including CAD, COPD, hepatitis C and cirrhosis s/p liver transplant in 2011 who presented to the ED this am with SOB.  Had SOB with increased breathing last night and called her daughter.  Her daughter helped her calm down and slow her breathing but still SO worse when supine like when she had fluid overload. She felt fine yesterday, went to church and started feeling badly last night with chills and increased the thermostat in the house until everyone else was hot. She has HA, cough, very tired feeling, She vomited "yellow stuff" this am, but hasn't eaten anything.  Her last dialysis was 1/10 and uneventful getting close to her EDW with post weight of 74.8 and sitting BP 158/74 and standing 129/72 with P of 70.   Past Medical History  Diagnosis Date  . Liver disease     cirrhosis and this is why she had the transplant in 2011  . Chronic back pain   . Diverticulosis   . CAD (coronary artery disease)   . Obesity   . Peripheral vascular disease hands and legs  . Anxiety   . Asthma   . GERD (gastroesophageal reflux disease)     takes Omeprazole daily  . Chronic constipation     takes MIralax and Colace daily  . Anemia     takes Folic Acid daily  . Hypothyroidism     takes Synthroid daily  . Depression     takes Cymbalta for "severe" depression  . Neuromuscular disorder     carpal tunnel in right hand  . Hypertension     takes Metoprolol and Lisinopril daily, sees Dr Bea Graff  . COPD (chronic obstructive pulmonary disease)   . Pneumonia     "today and several times before" (08/30/2012)  . Chronic bronchitis     "q yr w/season changes" (08/30/2012)  . Shortness of breath     "lying down and w/exertion; this past year"  (08/30/2012)  . Type II diabetes mellitus     Levemir 2units daily if > 150  . History of blood transfusion     "several" (08/30/2012)  . Cirrhosis of liver due to hepatitis C     "from blood transfusion back in 1983" (08/30/2012)  . Hepatitis C   . Migraine     "last migraine was in 2013" (08/30/2012)  . Headache     "at least monthly" (08/30/2012)  . Arthritis     "left hand, back" (08/30/2012)  . End stage renal disease on dialysis 02/27/2011    Kidneys shut down at time of liver transplant in Sept 2011 at [facility] in [place], she has been on HD ever since.  Dialyzes at [facility] on TTS schedule.  Had L forearm graft used 10 months then removed Dec 2012 due to suspected infection.  A right upper arm AVG was placed Dec 2012 but she developed steal symptoms acutely and it was ligated the same day.  Never had an AV fistula due to small veins.  Now has L thigh AVG put in Jan 2013, has not clotted to date.    [name] ESRD (end stage renal disease)     "TTS; [facility], [place]" (08/30/2012)   Past Surgical History  Procedure Laterality Date  . Liver transplant  10/25/2009  sees Dr Ferol Luz 1 every 6 months, saw last in Dec 2013. [name] Coord [phone]  . Small intestine surgery  90's  . Thrombectomy    . Arteriovenous graft placement Left 10/03/10     forearm  . Avgg removal  12/23/2010    Procedure: REMOVAL OF ARTERIOVENOUS GORETEX GRAFT ([place]);  Surgeon: Elam Dutch, MD;  Location: [place];  Service: Vascular;  Laterality: Left;  procedure started @1736 -1287  . Insertion of dialysis catheter  12/23/2010    Procedure: INSERTION OF DIALYSIS CATHETER;  Surgeon: Elam Dutch, MD;  Location: [place];  Service: Vascular;  Laterality: Right;  Right Internal Jugular 28cm dialysis catheter insertion procedure time 1701-1720   . Cholecystectomy  1993  . Cystoscopy  1990's  . Spinal growth rods  2010    "put 2 metal rods in my back; they had detetriorated" (08/30/2012)  . Av fistula  placement  01/29/2011    Procedure: INSERTION OF ARTERIOVENOUS (AV) GORE-TEX GRAFT ARM;  Surgeon: Elam Dutch, MD;  Location: [place];  Service: Vascular;  Laterality: Right;  . Av fistula placement  03/10/2011    Procedure: INSERTION OF ARTERIOVENOUS (AV) GORE-TEX GRAFT THIGH;  Surgeon: Elam Dutch, MD;  Location: [place];  Service: Vascular;  Laterality: Left;  . Tubal ligation  1990's  . Cardiac catheterization  2014   Family History  Problem Relation Age of Onset  . Cancer Mother   . Diabetes Mother   . Hypertension Mother   . Stroke Mother   . Cancer Father   . Anesthesia problems Neg Hx   . Hypotension Neg Hx   . Malignant hyperthermia Neg Hx   . Pseudochol deficiency Neg Hx    Social History:  reports that she has been smoking Cigarettes.  She has a 30 pack-year smoking history. She has never used smokeless tobacco. She reports that she drinks alcohol. She reports that she does not use illicit drugs. Allergies  Allergen Reactions  . Acetaminophen Other (See Comments)    Liver problems  . Codeine Itching   Prior to Admission medications   Medication Sig Start Date End Date Taking? Authorizing Provider  cinacalcet (SENSIPAR) 30 MG tablet Take 30 mg by mouth daily.   Yes Historical Provider, MD  docusate sodium (COLACE) 100 MG capsule Take 100 mg by mouth 2 (two) times daily.     Yes Historical Provider, MD  DULoxetine (CYMBALTA) 60 MG capsule Take 60 mg by mouth daily.     Yes Historical Provider, MD  gabapentin (NEURONTIN) 300 MG capsule Take 300 mg by mouth daily.    Yes Historical Provider, MD  levothyroxine (SYNTHROID, LEVOTHROID) 150 MCG tablet Take 150 mcg by mouth daily before breakfast.   Yes Historical Provider, MD  LORazepam (ATIVAN) 0.5 MG tablet Take 0.5 mg by mouth daily as needed for anxiety.   Yes Historical Provider, MD  metoprolol tartrate (LOPRESSOR) 25 MG tablet Take 37.5 mg by mouth 2 (two) times daily. 02/07/13  Yes Rebecca Eaton, MD   nitroGLYCERIN (NITROSTAT) 0.4 MG SL tablet Place 0.4 mg under the tongue every 5 (five) minutes as needed for chest pain.   Yes Historical Provider, MD  promethazine (PHENERGAN) 12.5 MG tablet Take 12.5 mg by mouth every 6 (six) hours as needed for nausea or vomiting.   Yes Historical Provider, MD  traMADol (ULTRAM) 50 MG tablet Take 50 mg by mouth 2 (two) times daily as needed (pain).    Yes Historical Provider, MD  albuterol (  PROVENTIL,VENTOLIN) 90 MCG/ACT inhaler Inhale 2 puffs into the lungs every 6 (six) hours as needed for wheezing. For shortness of breath    Historical Provider, MD  aspirin EC 81 MG tablet Take 81 mg by mouth daily.      Historical Provider, MD  CALCIUM CARBONATE PO Take 500 mg by mouth daily.    Historical Provider, MD  dextromethorphan-guaiFENesin (MUCINEX DM) 30-600 MG per 12 hr tablet Take 1 tablet by mouth 2 (two) times daily.    Historical Provider, MD  dicyclomine (BENTYL) 10 MG capsule Take 10 mg by mouth every 6 (six) hours as needed (for stomach pain).    Historical Provider, MD  insulin detemir (LEVEMIR) 100 UNIT/ML injection Inject 5 Units into the skin at bedtime as needed. Uses only if blood sugar levels are over 150 12/25/11   Nita Sells, MD  midodrine (PROAMATINE) 10 MG tablet Take 1 tablet (10 mg total) by mouth every dialysis. 02/07/13   Rebecca Eaton, MD  Multiple Vitamin (MULTIVITAMIN WITH MINERALS) TABS tablet Take 1 tablet by mouth daily.    Historical Provider, MD  nicotine (NICODERM CQ - DOSED IN MG/24 HR) 7 mg/24hr patch Place 7 mg onto the skin daily.    Historical Provider, MD  omeprazole (PRILOSEC) 20 MG capsule Take 20 mg by mouth daily.    Historical Provider, MD  polyethylene glycol (MIRALAX / GLYCOLAX) packet Take 17 g by mouth 2 (two) times daily.     Historical Provider, MD  senna (SENOKOT) 8.6 MG TABS tablet Take 1 tablet by mouth every evening.    Historical Provider, MD  sevelamer carbonate (RENVELA) 800 MG tablet Take 800 mg  by mouth 3 (three) times daily with meals.    Historical Provider, MD  sucralfate (CARAFATE) 1 G tablet Take 1 g by mouth 2 (two) times daily.     Historical Provider, MD  tacrolimus (PROGRAF) 1 MG capsule Take 5 mg by mouth 2 (two) times daily.    Historical Provider, MD   Current Facility-Administered Medications  Medication Dose Route Frequency Provider Last Rate Last Dose  . [START ON 02/22/2013] piperacillin-tazobactam (ZOSYN) IVPB 2.25 g  2.25 g Intravenous Q8H Rande Lawman Rumbarger, [facility]      . piperacillin-tazobactam (ZOSYN) IVPB 3.375 g  3.375 g Intravenous Once [employer] Rumbarger, [facility]      . vancomycin (VANCOCIN) 1,500 mg in sodium chloride 0.9 % 500 mL IVPB  1,500 mg Intravenous NOW Rande Lawman Rumbarger, RPH      . [START ON 02/22/2013] vancomycin (VANCOCIN) IVPB 750 mg/150 ml premix  750 mg Intravenous Q T,Th,Sa-HD Rande Lawman Rumbarger, [facility]       Current Outpatient Prescriptions  Medication Sig Dispense Refill  . cinacalcet (SENSIPAR) 30 MG tablet Take 30 mg by mouth daily.      [name] docusate sodium (COLACE) 100 MG capsule Take 100 mg by mouth 2 (two) times daily.        . DULoxetine (CYMBALTA) 60 MG capsule Take 60 mg by mouth daily.        [name] gabapentin (NEURONTIN) 300 MG capsule Take 300 mg by mouth daily.       [name] levothyroxine (SYNTHROID, LEVOTHROID) 150 MCG tablet Take 150 mcg by mouth daily before breakfast.      . LORazepam (ATIVAN) 0.5 MG tablet Take 0.5 mg by mouth daily as needed for anxiety.      . metoprolol tartrate (LOPRESSOR) 25 MG tablet Take 37.5 mg by mouth 2 (two) times daily.      [name]  nitroGLYCERIN (NITROSTAT) 0.4 MG SL tablet Place 0.4 mg under the tongue every 5 (five) minutes as needed for chest pain.      . promethazine (PHENERGAN) 12.5 MG tablet Take 12.5 mg by mouth every 6 (six) hours as needed for nausea or vomiting.      . traMADol (ULTRAM) 50 MG tablet Take 50 mg by mouth 2 (two) times daily as needed (pain).       [name] albuterol (PROVENTIL,VENTOLIN) 90  MCG/ACT inhaler Inhale 2 puffs into the lungs every 6 (six) hours as needed for wheezing. For shortness of breath      . aspirin EC 81 MG tablet Take 81 mg by mouth daily.        [name] CALCIUM CARBONATE PO Take 500 mg by mouth daily.      [name] dextromethorphan-guaiFENesin (MUCINEX DM) 30-600 MG per 12 hr tablet Take 1 tablet by mouth 2 (two) times daily.      [name] dicyclomine (BENTYL) 10 MG capsule Take 10 mg by mouth every 6 (six) hours as needed (for stomach pain).      . insulin detemir (LEVEMIR) 100 UNIT/ML injection Inject 5 Units into the skin at bedtime as needed. Uses only if blood sugar levels are over 150      . midodrine (PROAMATINE) 10 MG tablet Take 1 tablet (10 mg total) by mouth every dialysis.      . Multiple Vitamin (MULTIVITAMIN WITH MINERALS) TABS tablet Take 1 tablet by mouth daily.      . nicotine (NICODERM CQ - DOSED IN MG/24 HR) 7 mg/24hr patch Place 7 mg onto the skin daily.      [name] omeprazole (PRILOSEC) 20 MG capsule Take 20 mg by mouth daily.      . polyethylene glycol (MIRALAX / GLYCOLAX) packet Take 17 g by mouth 2 (two) times daily.       [name] senna (SENOKOT) 8.6 MG TABS tablet Take 1 tablet by mouth every evening.      . sevelamer carbonate (RENVELA) 800 MG tablet Take 800 mg by mouth 3 (three) times daily with meals.      . sucralfate (CARAFATE) 1 G tablet Take 1 g by mouth 2 (two) times daily.       . tacrolimus (PROGRAF) 1 MG capsule Take 5 mg by mouth 2 (two) times daily.       Labs: Basic Metabolic Panel:  Recent Labs Lab 02/21/13 1046  NA 139  K 4.2  CL 100  GLUCOSE 197*  BUN 41*  CREATININE 8.50*  CBC:  Recent Labs Lab 02/21/13 1025 02/21/13 1046  WBC 8.9  --   NEUTROABS 7.4  --   HGB 11.5* 12.6  HCT 34.8* 37.0  MCV 97.8  --   PLT 117*  --   Studies/Results: Dg Chest 2 View  02/21/2013   CLINICAL DATA:  Cough and congestion ; shortness of breath  EXAM: CHEST  2 VIEW  COMPARISON:  February 05, 2013  FINDINGS: There is no edema or consolidation. There is  a rather minimal right effusion. Heart is upper normal in size with normal pulmonary vascularity. No adenopathy. There are surgical clips in the upper abdomen.  IMPRESSION: Minimal right effusion.  No edema or consolidation.   Electronically Signed   By: Lowella Grip M.D.   On: 02/21/2013 10:03   ROS: As per HPI otherwise neg.  Physical Exam: Filed Vitals:   02/21/13 1115 02/21/13 1230 02/21/13 1432 02/21/13 1445  BP: 141/65 132/64 133/73 152/76  Pulse: 94 91 83 81  Temp:      TempSrc:      Resp: 24 31 20 25   SpO2: 96% 98% 99% 99%     General: Chronically ill appearing older than chronological age Head: Normocephalic, atraumatic, sclera non-icteric, mucus membranes are moist edentulous OP no exudates (left dentures home) Neck: Supple. JVD not elevated. Lungs: Clear bilaterally to auscultation without wheezes, rales, or rhonchi; poor expansion; coughs with deep inspiration (s/p treatment with nebs, IV steroids) Heart: RRR with S1 S2. 3/6 murmur Abdomen: Soft, non-tender, non-distended with normoactive bowel sounds. No rebound/guarding.  Lower extremities:without edema or ischemic changes, no open wounds  Neuro: Alert and oriented X 3. Moves all extremities spontaneously. Psych:  Responds to questions appropriately with a normal affect. Dialysis Access: left thigh AVGG + bruit  Dialysis Orders: Center: [facility] on TTS .  EDW 74.5kg (gets +/- EDW with wt gains 2-4 kg)HD Bath 2K/2.25Ca Time 4:00 Heparin NONE. Access L thigh AVG BFR 400 DFR A1.5 Hectorol 4 mcg IV/HD Epogen 1000 q wk Units IV/HD Venofer 50 q wk  Recent labs: Hgb 11.6, Tsat 36%, P 6.1, PTH 194  Assessment/Plan: 1. Low grade fever, SOB, dry cough - relatively sudden onset yesterday pm - per admitting MD; at risk for atypical PNA due to transplant status; Sats ok now. 2. ESRD -  TTS - HD tomorrow per routine K 4.2  3. Hypertension/volume  - BP variable -  d/c on metoprolol 25 bid per discussion on d/c summary but 12.5 bid was  on d/c med list;  BP at dialysis are a little elevated pre HD but come down during treatment; EDW also ^ 1 kg last admission; on discussion with her, it appears she is taking metoprolol 37.5 bid - as per Rx bottle and not as on her d/c med list; I think we need to challenge volume and maybe lower edw and maybe metoprolol given AS; also on midodrine for BP support pre HD 4. Anemia  - Hgb 11.5 - only on Epo 1000 per week; no ESAs for now: continue weekly IV Fe 5. Metabolic bone disease -  Controlled with current meds; hectorol 4; takes tums - vague about dose and sensipar 30; not renvela even though it is on her outpt med list 6. Nutrition - needs renal diet 7. Thrombocytopenia - Platelets 117K; on no heparin HD - stable 8. 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 Anxiety/depression -  On ativan and cymbalta 10. Hypothyroidism - on replacement tx 11. Card issues:  mod AS with EF 45-50% 07/2012 - EKG on adm no sig change from prior; Trop I 0.04  Myriam Jacobson, PA-C [place] [phone] 02/21/2013, 3:54 PM   I have seen and examined patient, discussed with PA and agree with assessment and plan as outlined above. 58 yo with ESRD on HD, hx liver transplant 2011, low BP's on midodrine presenting with chest pain, cough, chills and SOB.  CXR without gross infiltrate. Admitted for r/o MI.  Also started on abx for possible PNA.  Will check flu status if not done already.  Plan HD tomorrow.  [name] MD pager [phone]    cell [phone] 02/21/2013, 3:56 PM

## 2013-02-21 NOTE — ED Provider Notes (Signed)
CSN: [id number]     Arrival date & time 02/21/13  1751 History   First MD Initiated Contact with Patient 02/21/13 [phone]     Chief Complaint  Patient presents with  . Shortness of Breath   (Consider location/radiation/quality/duration/timing/severity/associated sxs/prior Treatment) Patient is a 58 y.o. female presenting with shortness of breath. The history is provided by the patient.  Shortness of Breath Associated symptoms: chest pain and cough   Associated symptoms: no abdominal pain, no headaches, no rash and no vomiting    is sure shortness of breath. Began yesterday today. She's had a little bit of a cough bringing up white sputum/foam. Occasional chest pain. No fevers. She states that she has had a cough. She had dialysis last on Saturday and is due for dialysis tomorrow. She does not make urine. No abdominal pain. She's had mild nausea.  Past Medical History  Diagnosis Date  . 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 Chronic back pain   . CAD (coronary artery disease)   . Obesity   . Peripheral vascular disease hands and legs  . Anxiety   . Asthma   . GERD (gastroesophageal reflux disease)     takes Omeprazole daily  . Chronic constipation     takes MIralax and Colace daily  . Anemia     takes Folic Acid daily  . Hypothyroidism     takes Synthroid daily  . Depression     takes Cymbalta for "severe" depression  . Neuromuscular disorder     carpal tunnel in right hand  . Hypertension     takes Metoprolol and Lisinopril daily, sees Dr Bea Graff  . COPD (chronic obstructive pulmonary disease)   . Pneumonia     "today and several times before" (08/30/2012)  . Chronic bronchitis     "q yr w/season changes" (08/30/2012)  . Type II diabetes mellitus     Levemir 2units daily if > 150  . History of blood transfusion     "several" (08/30/2012)  . Hepatitis C   . Migraine     "last migraine was in 2013"  (08/30/2012)  . Headache     "at least monthly" (08/30/2012)  . Arthritis     "left hand, back" (08/30/2012)  . End stage renal disease on dialysis 02/27/2011    Kidneys shut down at time of liver transplant in Sept 2011 at [facility] in [place], she has been on HD ever since.  Dialyzes at [facility] on TTS schedule.  Had L forearm graft used 10 months then removed Dec 2012 due to suspected infection.  A right upper arm AVG was placed Dec 2012 but she developed steal symptoms acutely and it was ligated the same day.  Never had an AV fistula due to small veins.  Now has L thigh AVG put in Jan 2013, has not clotted to date.     Past Surgical History  Procedure Laterality Date  . Liver transplant  10/25/2009    sees Dr Ferol Luz 1 every 6 months, saw last in Dec 2013. [name] Coord [phone]  . Small intestine surgery  90's  . Thrombectomy    . Arteriovenous graft placement Left 10/03/10     forearm  . Avgg removal  12/23/2010    Procedure: REMOVAL OF ARTERIOVENOUS GORETEX GRAFT ([place]);  Surgeon: Elam Dutch, MD;  Location: [place];  Service: Vascular;  Laterality: Left;  procedure started @1736 -1852  . Insertion of dialysis catheter  12/23/2010    Procedure: INSERTION OF DIALYSIS CATHETER;  Surgeon: Elam Dutch, MD;  Location: [place];  Service: Vascular;  Laterality: Right;  Right Internal Jugular 28cm dialysis catheter insertion procedure time 1701-1720   . Cholecystectomy  1993  . Cystoscopy  1990's  . Spinal growth rods  2010    "put 2 metal rods in my back; they had detetriorated" (08/30/2012)  . Av fistula placement  01/29/2011    Procedure: INSERTION OF ARTERIOVENOUS (AV) GORE-TEX GRAFT ARM;  Surgeon: Elam Dutch, MD;  Location: [place];  Service: Vascular;  Laterality: Right;  . Av fistula placement  03/10/2011    Procedure: INSERTION OF ARTERIOVENOUS (AV) GORE-TEX GRAFT THIGH;  Surgeon: Elam Dutch, MD;  Location: [place];  Service: Vascular;  Laterality: Left;  . Tubal  ligation  1990's  . Cardiac catheterization  2014   Family History  Problem Relation Age of Onset  . Cancer Mother   . Diabetes Mother   . Hypertension Mother   . Stroke Mother   . Cancer Father   . Anesthesia problems Neg Hx   . Hypotension Neg Hx   . Malignant hyperthermia Neg Hx   . Pseudochol deficiency Neg Hx    History  Substance Use Topics  . Smoking status: Current Every Day Smoker -- 0.75 packs/day for 40 years    Types: Cigarettes  . Smokeless tobacco: Never Used  . Alcohol Use: Yes     Comment: 08/30/2012 "last drink was at a wedding July, 2014; had a small glass of wine; never had problems w/alcohol"   OB History   Grav Para Term Preterm Abortions TAB SAB Ect Mult Living                 Review of Systems  Constitutional: Positive for chills. Negative for activity change and appetite change.  Eyes: Negative for pain.  Respiratory: Positive for cough and shortness of breath. Negative for chest tightness.   Cardiovascular: Positive for chest pain. Negative for leg swelling.  Gastrointestinal: Negative for nausea, vomiting, abdominal pain and diarrhea.  Genitourinary: Negative for flank pain.  Musculoskeletal: Negative for back pain and neck stiffness.  Skin: Negative for rash.  Neurological: Negative for weakness, numbness and headaches.  Psychiatric/Behavioral: Negative for behavioral problems.    Allergies  Acetaminophen and Codeine  Home Medications   Current Outpatient Rx  Name  Route  Sig  Dispense  Refill  . cinacalcet (SENSIPAR) 30 MG tablet   Oral   Take 30 mg by mouth daily.         [name] docusate sodium (COLACE) 100 MG capsule   Oral   Take 100 mg by mouth 2 (two) times daily.           . DULoxetine (CYMBALTA) 60 MG capsule   Oral   Take 60 mg by mouth daily.           [name] gabapentin (NEURONTIN) 300 MG capsule   Oral   Take 300 mg by mouth daily.          [name] levothyroxine (SYNTHROID, LEVOTHROID) 150 MCG tablet   Oral   Take 150 mcg  by mouth daily before breakfast.         . LORazepam (ATIVAN) 0.5 MG tablet   Oral   Take 0.5 mg by mouth daily as needed for anxiety.         [name]  metoprolol tartrate (LOPRESSOR) 25 MG tablet   Oral   Take 37.5 mg by mouth 2 (two) times daily.         . nitroGLYCERIN (NITROSTAT) 0.4 MG SL tablet   Sublingual   Place 0.4 mg under the tongue every 5 (five) minutes as needed for chest pain.         . promethazine (PHENERGAN) 12.5 MG tablet   Oral   Take 12.5 mg by mouth every 6 (six) hours as needed for nausea or vomiting.         . traMADol (ULTRAM) 50 MG tablet   Oral   Take 50 mg by mouth 2 (two) times daily as needed (pain).          [name] albuterol (PROVENTIL,VENTOLIN) 90 MCG/ACT inhaler   Inhalation   Inhale 2 puffs into the lungs every 6 (six) hours as needed for wheezing. For shortness of breath         . aspirin EC 81 MG tablet   Oral   Take 81 mg by mouth daily.           [name] CALCIUM CARBONATE PO   Oral   Take 500 mg by mouth daily.         [name] dextromethorphan-guaiFENesin (MUCINEX DM) 30-600 MG per 12 hr tablet   Oral   Take 1 tablet by mouth 2 (two) times daily.         [name] dicyclomine (BENTYL) 10 MG capsule   Oral   Take 10 mg by mouth every 6 (six) hours as needed (for stomach pain).         . insulin detemir (LEVEMIR) 100 UNIT/ML injection   Subcutaneous   Inject 5 Units into the skin at bedtime as needed. Uses only if blood sugar levels are over 150         . midodrine (PROAMATINE) 10 MG tablet   Oral   Take 1 tablet (10 mg total) by mouth every dialysis.         . Multiple Vitamin (MULTIVITAMIN WITH MINERALS) TABS tablet   Oral   Take 1 tablet by mouth daily.         . nicotine (NICODERM CQ - DOSED IN MG/24 HR) 7 mg/24hr patch   Transdermal   Place 7 mg onto the skin daily.         [name] omeprazole (PRILOSEC) 20 MG capsule   Oral   Take 20 mg by mouth daily.         . polyethylene glycol (MIRALAX / GLYCOLAX) packet   Oral    Take 17 g by mouth 2 (two) times daily.          [name] senna (SENOKOT) 8.6 MG TABS tablet   Oral   Take 1 tablet by mouth every evening.         . sevelamer carbonate (RENVELA) 800 MG tablet   Oral   Take 800 mg by mouth 3 (three) times daily with meals.         . sucralfate (CARAFATE) 1 G tablet   Oral   Take 1 g by mouth 2 (two) times daily.          . tacrolimus (PROGRAF) 1 MG capsule   Oral   Take 5 mg by mouth 2 (two) times daily.          BP 152/76  Pulse 81  Temp(Src) 99 F (37.2 C) (Oral)  Resp 25  SpO2 99% Physical Exam  Nursing note and vitals reviewed. Constitutional: She is oriented to person, place, and time. She appears well-developed and well-nourished.  Patient appears uncomfortable he  HENT:  Head: Normocephalic and atraumatic.  Eyes: EOM are normal. Pupils are equal, round, and reactive to light.  Neck: Normal range of motion. Neck supple.  Cardiovascular: Normal rate, regular rhythm and normal heart sounds.   No murmur heard. Pulmonary/Chest: She is in respiratory distress. She has wheezes. She has no rales.  Wheezes at the bases with some rales  Abdominal: Soft. Bowel sounds are normal. She exhibits no distension. There is no tenderness. There is no rebound and no guarding.  Musculoskeletal: Normal range of motion.  Neurological: She is alert and oriented to person, place, and time. No cranial nerve deficit.  Skin: Skin is warm and dry.  Psychiatric: She has a normal mood and affect. Her speech is normal.    ED Course  Procedures (including critical care time) Labs Review Labs Reviewed  CBC WITH DIFFERENTIAL - Abnormal; Notable for the following:    RBC 3.56 (*)    Hemoglobin 11.5 (*)    HCT 34.8 (*)    Platelets 117 (*)    Neutrophils Relative % 84 (*)    Lymphocytes Relative 11 (*)    All other components within normal limits  POCT I-STAT, CHEM 8 - Abnormal; Notable for the following:    BUN 41 (*)    Creatinine, Ser 8.50 (*)     Glucose, Bld 197 (*)    Calcium, Ion 1.03 (*)    All other components within normal limits  POCT I-STAT TROPONIN I   Imaging Review Dg Chest 2 View  02/21/2013   CLINICAL DATA:  Cough and congestion ; shortness of breath  EXAM: CHEST  2 VIEW  COMPARISON:  February 05, 2013  FINDINGS: There is no edema or consolidation. There is a rather minimal right effusion. Heart is upper normal in size with normal pulmonary vascularity. No adenopathy. There are surgical clips in the upper abdomen.  IMPRESSION: Minimal right effusion.  No edema or consolidation.   Electronically Signed   By: Lowella Grip M.D.   On: 02/21/2013 10:03    EKG Interpretation    Date/Time:  Monday February 21 2013 09:19:12 EST Ventricular Rate:  90 PR Interval:  138 QRS Duration: 87 QT Interval:  398 QTC Calculation: 487 R Axis:   -10 Text Interpretation:  Sinus rhythm LVH with secondary repolarization abnormality Anterior Q waves, possibly due to LVH No significant change since last tracing Confirmed by Matha Masse  MD, [place] ([id number]) on 02/21/2013 10:57:30 AM            MDM   1. COPD (chronic obstructive pulmonary disease)   2. Chronic combined systolic and diastolic congestive heart failure    Patient with shortness of breath. She is on dialysis. She is hypoxic with ambulation. X-ray is overall reassuring. Will admit to internal medicine. Likely a combination of COPD and some CHF    Wilferd Ritson R. Alvino Chapel, MD 02/21/13 1556

## 2013-02-21 NOTE — Progress Notes (Signed)
ANTIBIOTIC CONSULT NOTE - INITIAL  Pharmacy Consult for vancomycin + zosyn Indication: rule out pneumonia  Allergies  Allergen Reactions  . Acetaminophen Other (See Comments)    Liver problems  . Codeine Itching    Patient Measurements:   Adjusted Body Weight:   Vital Signs: Temp: 99 F (37.2 C) (01/12 0915) Temp src: Oral (01/12 0915) BP: 152/76 mmHg (01/12 1445) Pulse Rate: 81 (01/12 1445) Intake/Output from previous day:   Intake/Output from this shift:    Labs:  Recent Labs  02/21/13 1025 02/21/13 1046  WBC 8.9  --   HGB 11.5* 12.6  PLT 117*  --   CREATININE  --  8.50*   The CrCl is unknown because both a height and weight (above a minimum accepted value) are required for this calculation. No results found for this basename: VANCOTROUGH, VANCOPEAK, VANCORANDOM, GENTTROUGH, GENTPEAK, GENTRANDOM, TOBRATROUGH, TOBRAPEAK, TOBRARND, AMIKACINPEAK, AMIKACINTROU, AMIKACIN,  in the last 72 hours   Microbiology: No results found for this or any previous visit (from the past 720 hour(s)).  Medical History: Past Medical History  Diagnosis Date  . Liver disease     cirrhosis and this is why she had the transplant in 2011  . Chronic back pain   . Diverticulosis   . CAD (coronary artery disease)   . Obesity   . Peripheral vascular disease hands and legs  . Anxiety   . Asthma   . GERD (gastroesophageal reflux disease)     takes Omeprazole daily  . Chronic constipation     takes MIralax and Colace daily  . Anemia     takes Folic Acid daily  . Hypothyroidism     takes Synthroid daily  . Depression     takes Cymbalta for "severe" depression  . Neuromuscular disorder     carpal tunnel in right hand  . Hypertension     takes Metoprolol and Lisinopril daily, sees Dr Bea Graff  . COPD (chronic obstructive pulmonary disease)   . Pneumonia     "today and several times before" (08/30/2012)  . Chronic bronchitis     "q yr w/season changes" (08/30/2012)  . Shortness of  breath     "lying down and w/exertion; this past year" (08/30/2012)  . Type II diabetes mellitus     Levemir 2units daily if > 150  . History of blood transfusion     "several" (08/30/2012)  . Cirrhosis of liver due to hepatitis C     "from blood transfusion back in 1983" (08/30/2012)  . Hepatitis C   . Migraine     "last migraine was in 2013" (08/30/2012)  . Headache     "at least monthly" (08/30/2012)  . Arthritis     "left hand, back" (08/30/2012)  . End stage renal disease on dialysis 02/27/2011    Kidneys shut down at time of liver transplant in Sept 2011 at [facility] in [place], she has been on HD ever since.  Dialyzes at [facility] HD on TTS schedule.  Had L forearm graft used 10 months then removed Dec 2012 due to suspected infection.  A right upper arm AVG was placed Dec 2012 but she developed steal symptoms acutely and it was ligated the same day.  Never had an AV fistula due to small veins.  Now has L thigh AVG put in Jan 2013, has not clotted to date.    [name] ESRD (end stage renal disease)     "TTS; [facility], [place]" (08/30/2012)    Medications:  Anti-infectives   Start     Dose/Rate Route Frequency Ordered Stop   02/21/13 1600  piperacillin-tazobactam (ZOSYN) IVPB 3.375 g     3.375 g 100 mL/hr over 30 Minutes Intravenous  Once 02/21/13 1550     02/21/13 1600  vancomycin (VANCOCIN) 1,500 mg in sodium chloride 0.9 % 500 mL IVPB     1,500 mg 250 mL/hr over 120 Minutes Intravenous NOW 02/21/13 1550 02/22/13 1600     Assessment: 24 yof with a history of ESRD on HD TThSa presented to the ED with SOB. To start empiric vancomycin + zosyn to coverage. Pt is afebrile and WBC is WNL. Renal is planning HD tomorrow per regular schedule.   Vanc 1/12>> Zosyn 1/12>>  Goal of Therapy:  Vancomycin pre-HD level 15-25 mcg/ml  Plan:  1. Vancomycin 1500mg  IV x 1 then 750mg  post-HD 2. Zosyn 3.375gm IV x 1 over 30 minutes then 2.25gm IV Q8H 3. F/u renal plans, C&S, clinical status and pre-HD  vanc level at [place], Rande Lawman 02/21/2013,3:50 PM

## 2013-02-21 NOTE — ED Notes (Addendum)
Per EMS- pt began having SOB last night that is worse with exertion. Denies chest pain. Pt had COPD and CHF. Pt received 2 duonebs and solumedrol with EMS. Pt some relief. Pt received dialysis on Saturday.

## 2013-02-21 NOTE — H&P (Signed)
[facility] History and Physical  RHESA PIRRAGLIA [id number] DOB: [birth year] DOA: 02/21/2013  Referring physician: Dr. Alvino Chapel PCP: Charlynn Court, NP   Chief Complaint: Shortness of breath  HPI: [name] is a 58 y.o. female with past medical history of end-stage renal disease, status post liver transplant type 2 diabetes mellitus and aortic stenosis. Patient came in to the hospital because of shortness of breath. Patient said she was in her usual self also about just today when she had some cough which is nonproductive, patient felt chilly but did not report any fever. Reported sick contact with a friend who had upper respiratory tract symptoms. Patient denies orthopnea, denies lower extremity edema. In the ED chest x-ray done per radiology no edema in consultation, patient did have fever of 99 in the ED and sats with ambulation went down to 88%. Patient admitted to the hospital for further evaluation.   Review of Systems:  Constitutional: Has chills but denies fever Eyes: negative for irritation, redness and visual disturbance Ears, nose, mouth, throat, and face: negative for earaches, epistaxis, nasal congestion and sore throat Respiratory: Has cough, shortness of breath but no sputum production or wheezing. Cardiovascular: negative for chest pain, dyspnea, lower extremity edema, orthopnea, palpitations and syncope Gastrointestinal: negative for abdominal pain, constipation, diarrhea, melena, nausea and vomiting Genitourinary:negative for dysuria, frequency and hematuria Hematologic/lymphatic: negative for bleeding, easy bruising and lymphadenopathy Musculoskeletal:negative for arthralgias, muscle weakness and stiff joints Neurological: negative for coordination problems, gait problems, headaches and weakness Endocrine: negative for diabetic symptoms including polydipsia, polyuria and weight loss Allergic/Immunologic: negative for anaphylaxis, hay fever and  urticaria   Past Medical History  Diagnosis Date  . 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 Chronic back pain   . CAD (coronary artery disease)   . Obesity   . Peripheral vascular disease hands and legs  . Anxiety   . Asthma   . GERD (gastroesophageal reflux disease)     takes Omeprazole daily  . Chronic constipation     takes MIralax and Colace daily  . Anemia     takes Folic Acid daily  . Hypothyroidism     takes Synthroid daily  . Depression     takes Cymbalta for "severe" depression  . Neuromuscular disorder     carpal tunnel in right hand  . Hypertension     takes Metoprolol and Lisinopril daily, sees Dr Bea Graff  . COPD (chronic obstructive pulmonary disease)   . Pneumonia     "today and several times before" (08/30/2012)  . Chronic bronchitis     "q yr w/season changes" (08/30/2012)  . Type II diabetes mellitus     Levemir 2units daily if > 150  . History of blood transfusion     "several" (08/30/2012)  . Hepatitis C   . Migraine     "last migraine was in 2013" (08/30/2012)  . Headache     "at least monthly" (08/30/2012)  . Arthritis     "left hand, back" (08/30/2012)  . End stage renal disease on dialysis 02/27/2011    Kidneys shut down at time of liver transplant in Sept 2011 at [facility] in [place], she has been on HD ever since.  Dialyzes at [facility] on TTS schedule.  Had L forearm graft used 10 months then removed Dec 2012 due to suspected infection.  A right upper arm  AVG was placed Dec 2012 but she developed steal symptoms acutely and it was ligated the same day.  Never had an AV fistula due to small veins.  Now has L thigh AVG put in Jan 2013, has not clotted to date.     Past Surgical History  Procedure Laterality Date  . Liver transplant  10/25/2009    sees Dr Ferol Luz 1 every 6 months, saw last in Dec 2013. [name] Coord [phone]  . Small intestine surgery  90's  .  Thrombectomy    . Arteriovenous graft placement Left 10/03/10     forearm  . Avgg removal  12/23/2010    Procedure: REMOVAL OF ARTERIOVENOUS GORETEX GRAFT (Santel);  Surgeon: Elam Dutch, MD;  Location: [place];  Service: Vascular;  Laterality: Left;  procedure started @1736 -1852  . Insertion of dialysis catheter  12/23/2010    Procedure: INSERTION OF DIALYSIS CATHETER;  Surgeon: Elam Dutch, MD;  Location: [place];  Service: Vascular;  Laterality: Right;  Right Internal Jugular 28cm dialysis catheter insertion procedure time 1701-1720   . Cholecystectomy  1993  . Cystoscopy  1990's  . Spinal growth rods  2010    "put 2 metal rods in my back; they had detetriorated" (08/30/2012)  . Av fistula placement  01/29/2011    Procedure: INSERTION OF ARTERIOVENOUS (AV) GORE-TEX GRAFT ARM;  Surgeon: Elam Dutch, MD;  Location: [place];  Service: Vascular;  Laterality: Right;  . Av fistula placement  03/10/2011    Procedure: INSERTION OF ARTERIOVENOUS (AV) GORE-TEX GRAFT THIGH;  Surgeon: Elam Dutch, MD;  Location: [place];  Service: Vascular;  Laterality: Left;  . Tubal ligation  1990's  . Cardiac catheterization  2014   Social History:  reports that she has been smoking Cigarettes.  She has a 30 pack-year smoking history. She has never used smokeless tobacco. She reports that she drinks alcohol. She reports that she does not use illicit drugs.  Allergies  Allergen Reactions  . Acetaminophen Other (See Comments)    Liver transplant recipient   . Codeine Itching    Family History  Problem Relation Age of Onset  . Cancer Mother   . Diabetes Mother   . Hypertension Mother   . Stroke Mother   . Cancer Father   . Anesthesia problems Neg Hx   . Hypotension Neg Hx   . Malignant hyperthermia Neg Hx   . Pseudochol deficiency Neg Hx      Prior to Admission medications   Medication Sig Start Date End Date Taking? Authorizing Provider  albuterol (PROVENTIL,VENTOLIN) 90 MCG/ACT inhaler  Inhale 2 puffs into the lungs every 6 (six) hours as needed for wheezing. For shortness of breath   Yes Historical Provider, MD  aspirin EC 81 MG tablet Take 81 mg by mouth daily.     Yes Historical Provider, MD  calcium carbonate (TUMS - DOSED IN MG ELEMENTAL CALCIUM) 500 MG chewable tablet Chew 1 tablet by mouth daily.   Yes Historical Provider, MD  cinacalcet (SENSIPAR) 30 MG tablet Take 30 mg by mouth daily.   Yes Historical Provider, MD  dextromethorphan-guaiFENesin (MUCINEX DM) 30-600 MG per 12 hr tablet Take 1 tablet by mouth 2 (two) times daily.   Yes Historical Provider, MD  docusate sodium (COLACE) 100 MG capsule Take 100 mg by mouth 2 (two) times daily.     Yes Historical Provider, MD  DULoxetine (CYMBALTA) 60 MG capsule Take 60 mg by mouth daily.  Yes Historical Provider, MD  gabapentin (NEURONTIN) 300 MG capsule Take 300 mg by mouth daily.    Yes Historical Provider, MD  insulin detemir (LEVEMIR) 100 UNIT/ML injection Inject 5 Units into the skin at bedtime as needed. Uses only if blood sugar levels are over 150 12/25/11  Yes Nita Sells, MD  levothyroxine (SYNTHROID, LEVOTHROID) 150 MCG tablet Take 150 mcg by mouth daily before breakfast.   Yes Historical Provider, MD  metoprolol tartrate (LOPRESSOR) 25 MG tablet Take 37.5 mg by mouth 2 (two) times daily. 02/07/13  Yes Rebecca Eaton, MD  midodrine (PROAMATINE) 10 MG tablet Take 1 tablet (10 mg total) by mouth every dialysis. 02/07/13  Yes Rebecca Eaton, MD  Multiple Vitamin (MULTIVITAMIN WITH MINERALS) TABS tablet Take 1 tablet by mouth daily.   Yes Historical Provider, MD  nitroGLYCERIN (NITROSTAT) 0.4 MG SL tablet Place 0.4 mg under the tongue every 5 (five) minutes as needed for chest pain.   Yes Historical Provider, MD  omeprazole (PRILOSEC) 20 MG capsule Take 20 mg by mouth daily.   Yes Historical Provider, MD  polyethylene glycol (MIRALAX / GLYCOLAX) packet Take 17 g by mouth 2 (two) times daily.    Yes Historical  Provider, MD  senna (SENOKOT) 8.6 MG TABS tablet Take 1 tablet by mouth every evening.   Yes Historical Provider, MD  tacrolimus (PROGRAF) 1 MG capsule Take 5 mg by mouth 2 (two) times daily.   Yes Historical Provider, MD  traMADol (ULTRAM) 50 MG tablet Take 50 mg by mouth 2 (two) times daily as needed (pain).    Yes Historical Provider, MD  nicotine (NICODERM CQ - DOSED IN MG/24 HR) 7 mg/24hr patch Place 7 mg onto the skin daily.    Historical Provider, MD   Physical Exam: Filed Vitals:   02/21/13 1445  BP: 152/76  Pulse: 81  Temp:   Resp: 25    BP 152/76  Pulse 81  Temp(Src) 99 F (37.2 C) (Oral)  Resp 25  SpO2 99%  General:  Appears calm and comfortable Eyes: PERRL, normal lids, irises & conjunctiva ENT: grossly normal hearing, lips & tongue Neck: no LAD, masses or thyromegaly Cardiovascular: RRR. No LE edema. Grade 2/6 systolic murmur best heard on the right second intercostal space Telemetry: SR, no arrhythmias  Respiratory: CTA bilaterally, no w/r/r. Normal respiratory effort. Abdomen: soft, ntnd Skin: no rash or induration seen on limited exam Musculoskeletal: grossly normal tone BUE/BLE Psychiatric: grossly normal mood and affect, speech fluent and appropriate Neurologic: grossly non-focal.          Labs on Admission:  Basic Metabolic Panel:  Recent Labs Lab 02/21/13 1046  NA 139  K 4.2  CL 100  GLUCOSE 197*  BUN 41*  CREATININE 8.50*   Liver Function Tests: No results found for this basename: AST, ALT, ALKPHOS, BILITOT, PROT, ALBUMIN,  in the last 168 hours No results found for this basename: LIPASE, AMYLASE,  in the last 168 hours No results found for this basename: AMMONIA,  in the last 168 hours CBC:  Recent Labs Lab 02/21/13 1025 02/21/13 1046  WBC 8.9  --   NEUTROABS 7.4  --   HGB 11.5* 12.6  HCT 34.8* 37.0  MCV 97.8  --   PLT 117*  --    Cardiac Enzymes: No results found for this basename: CKTOTAL, CKMB, CKMBINDEX, TROPONINI,  in the  last 168 hours  BNP (last 3 results)  Recent Labs  07/20/12 0154 08/30/12 0758 09/01/12 0555  PROBNP  42149.0* 41152.0* 32077.0*   CBG: No results found for this basename: GLUCAP,  in the last 168 hours  Radiological Exams on Admission: Dg Chest 2 View  02/21/2013   CLINICAL DATA:  Cough and congestion ; shortness of breath  EXAM: CHEST  2 VIEW  COMPARISON:  February 05, 2013  FINDINGS: There is no edema or consolidation. There is a rather minimal right effusion. Heart is upper normal in size with normal pulmonary vascularity. No adenopathy. There are surgical clips in the upper abdomen.  IMPRESSION: Minimal right effusion.  No edema or consolidation.   Electronically Signed   By: Lowella Grip M.D.   On: 02/21/2013 10:03    EKG: Independently reviewed.  Assessment/Plan Principal Problem:   HCAP (healthcare-associated pneumonia) Active Problems:   HEPATITIS C- s/p liver transplant 2011   DIABETES MELLITUS, TYPE II   End stage renal disease on dialysis   Aortic stenosis, "moderate" per Dr C by 2D ehco 07/2012.    COPD (chronic obstructive pulmonary disease)   Hypoxia    Acute bronchitis -Acute bronchitis/early pneumonia, even with radiology reading chest x-ray looked like patient has hazy right-sided infiltrates. -Patient started on Zosyn and vancomycin as she goes to dialysis and this is considered health care associated. -Supportive management with mucolytics, antitussives and bronchodilators. -Check flu PCR  Hypoxia -Secondary to acute bronchitis. -Chest x-ray and lower extremity edema ruled out fluid overload.  Diabetes mellitus type 2 -Check hemoglobin A1c, patient will be on insulin sliding scale. -Continue home dose of Levemir insulin.  ESRD on hemodialysis -Nephrology notified for routine dialysis needs.  Aortic stenosis -Although the AVA is 0.7 cm, but peak and mean gradient are 50 and 30 respectively. -Moderate aortic stenosis according to the  gradient.  Code Status: Full code Family Communication: Plan discussed with the patient in presence of her daughter at bedside. Disposition Plan: Inpatient, telemetry, anticipate length of stay to be greater than 2 midnights  Time spent: 70 minutes  [facility] Pager [phone]

## 2013-02-22 DIAGNOSIS — E119 Type 2 diabetes mellitus without complications: Secondary | ICD-10-CM | POA: Diagnosis not present

## 2013-02-22 DIAGNOSIS — J9600 Acute respiratory failure, unspecified whether with hypoxia or hypercapnia: Secondary | ICD-10-CM

## 2013-02-22 DIAGNOSIS — J189 Pneumonia, unspecified organism: Secondary | ICD-10-CM | POA: Diagnosis not present

## 2013-02-22 DIAGNOSIS — R079 Chest pain, unspecified: Secondary | ICD-10-CM | POA: Diagnosis not present

## 2013-02-22 DIAGNOSIS — N186 End stage renal disease: Secondary | ICD-10-CM | POA: Diagnosis not present

## 2013-02-22 LAB — CBC
HEMATOCRIT: 29.4 % — AB (ref 36.0–46.0)
Hemoglobin: 9.8 g/dL — ABNORMAL LOW (ref 12.0–15.0)
MCH: 32.1 pg (ref 26.0–34.0)
MCHC: 33.3 g/dL (ref 30.0–36.0)
MCV: 96.4 fL (ref 78.0–100.0)
Platelets: 88 10*3/uL — ABNORMAL LOW (ref 150–400)
RBC: 3.05 MIL/uL — ABNORMAL LOW (ref 3.87–5.11)
RDW: 14.2 % (ref 11.5–15.5)
WBC: 11.8 10*3/uL — AB (ref 4.0–10.5)

## 2013-02-22 LAB — BASIC METABOLIC PANEL
BUN: 69 mg/dL — ABNORMAL HIGH (ref 6–23)
CHLORIDE: 93 meq/L — AB (ref 96–112)
CO2: 26 meq/L (ref 19–32)
CREATININE: 10.19 mg/dL — AB (ref 0.50–1.10)
Calcium: 8.6 mg/dL (ref 8.4–10.5)
GFR calc Af Amer: 4 mL/min — ABNORMAL LOW (ref >90)
GFR calc non Af Amer: 4 mL/min — ABNORMAL LOW (ref >90)
GLUCOSE: 269 mg/dL — AB (ref 70–99)
Potassium: 5.5 mEq/L — ABNORMAL HIGH (ref 3.7–5.3)
Sodium: 140 mEq/L (ref 137–147)

## 2013-02-22 LAB — HEPATITIS B SURFACE ANTIBODY,QUALITATIVE: Hep B S Ab: POSITIVE — AB

## 2013-02-22 LAB — GLUCOSE, CAPILLARY
GLUCOSE-CAPILLARY: 244 mg/dL — AB (ref 70–99)
GLUCOSE-CAPILLARY: 217 mg/dL — AB (ref 70–99)
GLUCOSE-CAPILLARY: 329 mg/dL — AB (ref 70–99)
Glucose-Capillary: 276 mg/dL — ABNORMAL HIGH (ref 70–99)
Glucose-Capillary: 224 mg/dL — ABNORMAL HIGH (ref 70–99)

## 2013-02-22 LAB — HEPATITIS B CORE ANTIBODY, TOTAL: Hep B Core Total Ab: NONREACTIVE

## 2013-02-22 LAB — HEMOGLOBIN A1C
Hgb A1c MFr Bld: 7.5 % — ABNORMAL HIGH (ref <5.7)
Mean Plasma Glucose: 169 mg/dL — ABNORMAL HIGH (ref <117)

## 2013-02-22 MED ORDER — DOXERCALCIFEROL 4 MCG/2ML IV SOLN
INTRAVENOUS | Status: AC
  Administered 2013-02-22: 13:00:00
  Filled 2013-02-22: qty 2

## 2013-02-22 MED ORDER — CALCIUM CARBONATE ANTACID 500 MG PO CHEW
400.0000 mg | CHEWABLE_TABLET | Freq: Three times a day (TID) | ORAL | Status: DC
  Administered 2013-02-22 – 2013-02-23 (×4): 400 mg via ORAL
  Filled 2013-02-22 (×5): qty 2

## 2013-02-22 NOTE — Progress Notes (Signed)
[facility] Progress Note  Subjective:   Brought ot HD off O@ sats in 80s pt jittery.  Pt said she felt rough. Sats better on O2 -   Objective Filed Vitals:   02/21/13 2051 02/21/13 2118 02/22/13 0545 02/22/13 0743  BP: 163/72  164/71 164/80  Pulse: 83  79 79  Temp: 98.3 F (36.8 C)  97.5 F (36.4 C) 97.7 F (36.5 C)  TempSrc: Oral  Oral Oral  Resp: 20  20 20   Height: 5' 3.5" (1.613 m)     Weight: 77.384 kg (170 lb 9.6 oz)     SpO2: 98% 98% 100% 96%   Physical Exam pre weight 78 kg(bed scale) - couldn't stand - goal set 3 L  General: appears ill Heart: RRR 3/6 murmur Lungs: right sided ^ crackles and wheezes poor exchange Abdomen: soft NT Extremities: no LE edema Dialysis Access:  Left thigh AVGG Qb 400  Dialysis Orders: Center: [facility] on TTS .  EDW 74.5kg (gets +/- EDW with wt gains 2-4 kg)HD Bath 2K/2.25Ca Time 4:00 Heparin NONE. Access L thigh AVG BFR 400 DFR A1.5 Hectorol 4 mcg IV/HD Epogen 1000 q wk Units IV/HD Venofer 50 q wk  Recent labs: Hgb 11.6, Tsat 36%, P 6.1, PTH 194   Assessment/Plan:  1. Low grade fever, SOB, dry cough/HCAP- relatively sudden onset Sundayt risk for atypical PNA due to transplant status; Sats ok now on O2; WBC ^ today but got IV steroids yesterdayempiric Vanc and Zoysn 2. ESRD - TTS - HD now - K 5.5 3. Hypertension/volume - BP variable - d/c on metoprolol 25 bid per discussion on d/c summary but 12.5 bid was on d/c med list; BP at dialysis are a little elevated pre HD but come down during treatment; EDW also ^ 1 kg last admission; on discussion with her, it appears she is taking metoprolol 37.5 bid - as per Rx bottle and not as on her d/c med list; I think we need to challenge volume and maybe lower edw and maybe metoprolol given AS; also on midodrine for BP support pre HD; currently on metoprolol 25 bid - BP ^ monitor during HD today to see how she responds to volume removal. D/W RN - plan ^ goal to 4 L and use crit line to decide  whether to increase further;  4. Anemia - Hgb 11.5 - only on Epo 1000 per week; no ESAs for now: continue weekly IV Fe; Hgb dropped to 9.8 since yesterday ? Spurious - hold on ESA and repeat CBC tomorrow 5. Metabolic bone disease - Controlled with current meds; hectorol 4; takes tums - vague about dose and sensipar 30; not renvela even though it is on her outpt med list - will d/c and change to tums 6. Nutrition - needs renal diet 7. Thrombocytopenia - Platelets 117K dropped to 88 - follow; on no heparin HD  8. 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 Anxiety/depression - On ativan and cymbalta 10. Hypothyroidism - on replacement tx 11. Card issues: mod AS with EF 45-50% 07/2012 - EKG on adm no sig change from prior; Trop I 0.04 12. Tobacco abuse - on patch 13. DM - per primary - BS up  Myriam Jacobson, PA-C [place] [phone] 02/22/2013,9:24 AM  LOS: 1 day   I have seen and examined patient, discussed with PA and agree with assessment and plan as outlined above. [name] MD pager [phone]  cell [phone] 02/22/2013, 12:30 PM    Additional Objective Labs: Basic Metabolic Panel:  Recent Labs Lab 02/21/13 1046 02/22/13 0520  NA 139 140  K 4.2 5.5*  CL 100 93*  CO2  --  26  GLUCOSE 197* 269*  BUN 41* 69*  CREATININE 8.50* 10.19*  CALCIUM  --  8.6  CBC:  Recent Labs Lab 02/21/13 1025 02/21/13 1046 02/22/13 0520  WBC 8.9  --  11.8*  NEUTROABS 7.4  --   --   HGB 11.5* 12.6 9.8*  HCT 34.8* 37.0 29.4*  MCV 97.8  --  96.4  PLT 117*  --  88*  CBG:  Recent Labs Lab 02/21/13 1913 02/21/13 2045 02/22/13 0505 02/22/13 0742  GLUCAP 391* 434* 276* 244*  Studies/Results: Dg Chest 2 View  02/21/2013   CLINICAL DATA:  Cough and congestion ; shortness of breath  EXAM: CHEST  2 VIEW  COMPARISON:  February 05, 2013  FINDINGS: There is no edema or consolidation. There is a rather minimal right effusion. Heart is upper  normal in size with normal pulmonary vascularity. No adenopathy. There are surgical clips in the upper abdomen.  IMPRESSION: Minimal right effusion.  No edema or consolidation.   Electronically Signed   By: Lowella Grip M.D.   On: 02/21/2013 10:03   Medications:   . albuterol  2.5 mg Nebulization TID  . aspirin EC  81 mg Oral Daily  . cinacalcet  30 mg Oral Q breakfast  . doxercalciferol  4 mcg Intravenous Q T,Th,Sa-HD  . DULoxetine  60 mg Oral Daily  . ferric gluconate (FERRLECIT/NULECIT) IV  125 mg Intravenous Q Tue-HD  . guaiFENesin  1,200 mg Oral BID  . heparin  5,000 Units Subcutaneous Q8H  . insulin aspart  0-9 Units Subcutaneous TID WC  . insulin detemir  5 Units Subcutaneous QHS  . levothyroxine  150 mcg Oral QAC breakfast  . metoprolol tartrate  25 mg Oral BID  . multivitamin with minerals  1 tablet Oral Daily  . nicotine  7 mg Transdermal Daily  . pantoprazole  40 mg Oral Daily  . piperacillin-tazobactam (ZOSYN)  IV  2.25 g Intravenous Q8H  . polyethylene glycol  17 g Oral BID  . sevelamer carbonate  800 mg Oral TID WC  . sodium chloride  3 mL Intravenous Q12H  . sucralfate  1 g Oral BID  . tacrolimus  5 mg Oral BID  . vancomycin  750 mg Intravenous Q T,Th,Sa-HD

## 2013-02-22 NOTE — Progress Notes (Signed)
[facility] PROGRESS NOTE  ANGELY DIETZ JXB:[id number] DOB: [birth year] DOA: 02/21/2013 PCP: Charlynn Court, NP  LACRETIA TINDALL is a 58 y.o. female with past medical history of end-stage renal disease, status post liver transplant type 2 diabetes mellitus and aortic stenosis. Patient came in to the hospital because of shortness of breath. Patient said she was in her usual self also about just today when she had some cough which is nonproductive, patient felt chilly but did not report any fever. Reported sick contact with a friend who had upper respiratory tract symptoms. Patient denies orthopnea, denies lower extremity edema.  In the ED chest x-ray done per radiology no edema in consultation, patient did have fever of 99 in the ED and sats with ambulation went down to 88%. Patient admitted to the hospital for further evaluation  Assessment/Plan: Acute bronchitis vs HCAPNA -Zosyn and vancomycin as she goes to dialysis and this is considered health care associated.  -Supportive management with mucolytics, antitussives and bronchodilators.  - flu PCR negative -O2 as needed  Hypoxia  -Secondary to acute bronchitis.  -wean as tolerated  Diabetes mellitus type 2  -Check hemoglobin A1c, patient will be on insulin sliding scale.  -Continue home dose of Levemir insulin.   ESRD on hemodialysis  -Nephrology notified for routine dialysis needs.   Aortic stenosis  -Moderate aortic stenosis according to the gradient.   Code Status: full Family Communication: patient Disposition Plan:    Consultants:  nephrology  Procedures:    Antibiotics:  Vanc/zosyn 1/13  HPI/Subjective: Having some SOB this AM No N/V No chest pain  Objective: Filed Vitals:   02/22/13 0743  BP: 164/80  Pulse: 79  Temp: 97.7 F (36.5 C)  Resp: 20   No intake or output data in the 24 hours ending 02/22/13 0812 Filed Weights   02/21/13 2051  Weight: 77.384 kg (170 lb 9.6 oz)     Exam:   General:  A+Ox3, appears winded  Cardiovascular: rrr  Respiratory: clear anterior  Abdomen: +BS, soft  Musculoskeletal: no edema   Data Reviewed: Basic Metabolic Panel:  Recent Labs Lab 02/21/13 1046 02/22/13 0520  NA 139 140  K 4.2 5.5*  CL 100 93*  CO2  --  26  GLUCOSE 197* 269*  BUN 41* 69*  CREATININE 8.50* 10.19*  CALCIUM  --  8.6   Liver Function Tests: No results found for this basename: AST, ALT, ALKPHOS, BILITOT, PROT, ALBUMIN,  in the last 168 hours No results found for this basename: LIPASE, AMYLASE,  in the last 168 hours No results found for this basename: AMMONIA,  in the last 168 hours CBC:  Recent Labs Lab 02/21/13 1025 02/21/13 1046 02/22/13 0520  WBC 8.9  --  11.8*  NEUTROABS 7.4  --   --   HGB 11.5* 12.6 9.8*  HCT 34.8* 37.0 29.4*  MCV 97.8  --  96.4  PLT 117*  --  88*   Cardiac Enzymes: No results found for this basename: CKTOTAL, CKMB, CKMBINDEX, TROPONINI,  in the last 168 hours BNP (last 3 results)  Recent Labs  07/20/12 0154 08/30/12 0758 09/01/12 0555  PROBNP 42149.0* 41152.0* 32077.0*   CBG:  Recent Labs Lab 02/21/13 1913 02/21/13 2045 02/22/13 0505 02/22/13 0742  GLUCAP 391* 434* 276* 244*    No results found for this or any previous visit (from the past 240 hour(s)).   Studies: Dg Chest 2 View  02/21/2013   CLINICAL DATA:  Cough and congestion ;  shortness of breath  EXAM: CHEST  2 VIEW  COMPARISON:  February 05, 2013  FINDINGS: There is no edema or consolidation. There is a rather minimal right effusion. Heart is upper normal in size with normal pulmonary vascularity. No adenopathy. There are surgical clips in the upper abdomen.  IMPRESSION: Minimal right effusion.  No edema or consolidation.   Electronically Signed   By: Lowella Grip M.D.   On: 02/21/2013 10:03    Scheduled Meds: . albuterol  2.5 mg Nebulization TID  . aspirin EC  81 mg Oral Daily  . cinacalcet  30 mg Oral Q breakfast  .  doxercalciferol  4 mcg Intravenous Q T,Th,Sa-HD  . DULoxetine  60 mg Oral Daily  . ferric gluconate (FERRLECIT/NULECIT) IV  125 mg Intravenous Q Tue-HD  . guaiFENesin  1,200 mg Oral BID  . heparin  5,000 Units Subcutaneous Q8H  . insulin aspart  0-9 Units Subcutaneous TID WC  . insulin detemir  5 Units Subcutaneous QHS  . levothyroxine  150 mcg Oral QAC breakfast  . metoprolol tartrate  25 mg Oral BID  . multivitamin with minerals  1 tablet Oral Daily  . nicotine  7 mg Transdermal Daily  . pantoprazole  40 mg Oral Daily  . piperacillin-tazobactam (ZOSYN)  IV  2.25 g Intravenous Q8H  . polyethylene glycol  17 g Oral BID  . sevelamer carbonate  800 mg Oral TID WC  . sodium chloride  3 mL Intravenous Q12H  . sucralfate  1 g Oral BID  . tacrolimus  5 mg Oral BID  . vancomycin  750 mg Intravenous Q T,Th,Sa-HD   Continuous Infusions:   Principal Problem:   HCAP (healthcare-associated pneumonia) Active Problems:   HEPATITIS C- s/p liver transplant 2011   DIABETES MELLITUS, TYPE II   End stage renal disease on dialysis   Aortic stenosis, "moderate" per Dr C by 2D ehco 07/2012.    COPD (chronic obstructive pulmonary disease)   Hypoxia    Time spent: 35 min    Koralyn Prestage  [facility] Pager [phone]. If 7PM-7AM, please contact night-coverage at [email or web address], password [facility] 02/22/2013, 8:12 AM  LOS: 1 day

## 2013-02-22 NOTE — Procedures (Signed)
I was present at this dialysis session, have reviewed the session itself and made  appropriate changes  [name] MD (pgr) [phone]    ([phone] 02/22/2013, 12:31 PM

## 2013-02-22 NOTE — Progress Notes (Signed)
Utilization review completed.  

## 2013-02-23 DIAGNOSIS — R079 Chest pain, unspecified: Secondary | ICD-10-CM | POA: Diagnosis not present

## 2013-02-23 DIAGNOSIS — I120 Hypertensive chronic kidney disease with stage 5 chronic kidney disease or end stage renal disease: Secondary | ICD-10-CM | POA: Diagnosis not present

## 2013-02-23 DIAGNOSIS — J810 Acute pulmonary edema: Secondary | ICD-10-CM | POA: Diagnosis not present

## 2013-02-23 DIAGNOSIS — D696 Thrombocytopenia, unspecified: Secondary | ICD-10-CM | POA: Diagnosis not present

## 2013-02-23 DIAGNOSIS — I359 Nonrheumatic aortic valve disorder, unspecified: Secondary | ICD-10-CM | POA: Diagnosis not present

## 2013-02-23 DIAGNOSIS — J189 Pneumonia, unspecified organism: Secondary | ICD-10-CM | POA: Diagnosis not present

## 2013-02-23 DIAGNOSIS — J449 Chronic obstructive pulmonary disease, unspecified: Secondary | ICD-10-CM | POA: Diagnosis not present

## 2013-02-23 DIAGNOSIS — N186 End stage renal disease: Secondary | ICD-10-CM | POA: Diagnosis not present

## 2013-02-23 DIAGNOSIS — J9600 Acute respiratory failure, unspecified whether with hypoxia or hypercapnia: Secondary | ICD-10-CM | POA: Diagnosis not present

## 2013-02-23 LAB — GLUCOSE, CAPILLARY
GLUCOSE-CAPILLARY: 187 mg/dL — AB (ref 70–99)
GLUCOSE-CAPILLARY: 137 mg/dL — AB (ref 70–99)
GLUCOSE-CAPILLARY: 222 mg/dL — AB (ref 70–99)

## 2013-02-23 LAB — CBC
HCT: 30.7 % — ABNORMAL LOW (ref 36.0–46.0)
Hemoglobin: 10.1 g/dL — ABNORMAL LOW (ref 12.0–15.0)
MCH: 32.1 pg (ref 26.0–34.0)
MCHC: 32.9 g/dL (ref 30.0–36.0)
MCV: 97.5 fL (ref 78.0–100.0)
Platelets: 96 10*3/uL — ABNORMAL LOW (ref 150–400)
RBC: 3.15 MIL/uL — ABNORMAL LOW (ref 3.87–5.11)
RDW: 14.5 % (ref 11.5–15.5)
WBC: 11.1 10*3/uL — ABNORMAL HIGH (ref 4.0–10.5)

## 2013-02-23 LAB — BASIC METABOLIC PANEL
BUN: 42 mg/dL — ABNORMAL HIGH (ref 6–23)
CO2: 28 mEq/L (ref 19–32)
Calcium: 9.2 mg/dL (ref 8.4–10.5)
Chloride: 91 mEq/L — ABNORMAL LOW (ref 96–112)
Creatinine, Ser: 7.09 mg/dL — ABNORMAL HIGH (ref 0.50–1.10)
GFR calc Af Amer: 7 mL/min — ABNORMAL LOW (ref >90)
GFR calc non Af Amer: 6 mL/min — ABNORMAL LOW (ref >90)
Glucose, Bld: 183 mg/dL — ABNORMAL HIGH (ref 70–99)
Potassium: 4.4 mEq/L (ref 3.7–5.3)
Sodium: 135 mEq/L — ABNORMAL LOW (ref 137–147)

## 2013-02-23 LAB — TROPONIN I: Troponin I: <0.3 ng/mL (ref <0.30)

## 2013-02-23 LAB — HEPATITIS B SURFACE ANTIGEN: Hepatitis B Surface Ag: NEGATIVE

## 2013-02-23 MED ORDER — GUAIFENESIN ER 600 MG PO TB12: 1200.0000 mg | ORAL_TABLET | Freq: Two times a day (BID) | ORAL | Status: DC

## 2013-02-23 MED ORDER — LEVOFLOXACIN 750 MG PO TABS: 750.0000 mg | ORAL_TABLET | ORAL | Status: DC

## 2013-02-23 MED ORDER — DARBEPOETIN ALFA-POLYSORBATE 25 MCG/0.42ML IJ SOLN: 25.0000 ug | INTRAMUSCULAR | Status: DC

## 2013-02-23 MED ORDER — METOPROLOL TARTRATE 25 MG PO TABS: 25.0000 mg | ORAL_TABLET | Freq: Two times a day (BID) | ORAL | Status: DC

## 2013-02-23 NOTE — Discharge Summary (Signed)
Physician Discharge Summary  [name] [name] [id number] DOB: 1955-11-24 DOA: 02/21/2013  PCP: Charlynn Court, NP  Admit date: 02/21/2013 Discharge date: 02/23/2013  Time spent: 45 minutes  Recommendations for Outpatient Follow-up:  -Will be discharged home today.   Discharge Diagnoses:  Principal Problem:   HCAP (healthcare-associated pneumonia) Active Problems:   HEPATITIS C- s/p liver transplant 2011   DIABETES MELLITUS, TYPE II   End stage renal disease on dialysis   Aortic stenosis, "moderate" per Dr C by 2D ehco 07/2012.    COPD (chronic obstructive pulmonary disease)   Hypoxia   Discharge Condition: Stable and improved  Filed Weights   02/22/13 0850 02/22/13 1311 02/22/13 2106  Weight: 78 kg (171 lb 15.3 oz) 73 kg (160 lb 15 oz) 76.1 kg (167 lb 12.3 oz)    History of present illness:  [name] is a 58 y.o. female with past medical history of end-stage renal disease, status post liver transplant type 2 diabetes mellitus and aortic stenosis. Patient came in to the hospital because of shortness of breath. Patient said she was in her usual self also about just today when she had some cough which is nonproductive, patient felt chilly but did not report any fever. Reported sick contact with a friend who had upper respiratory tract symptoms. Patient denies orthopnea, denies lower extremity edema.  In the ED chest x-ray done per radiology no edema in consultation, patient did have fever of 99 in the ED and sats with ambulation went down to 88%. Patient admitted to the hospital for further evaluation.   Hospital Course:   Acute bronchitis vs HCAP -Have transitioned to PO levaquin for 5 more days of treatment. -Does not have any oxygen requirements at present.  Hypoxia  -Secondary to acute bronchitis/HCAP -Resolved.  Diabetes mellitus type 2  -Fair control. -Continue home dose of Levemir insulin.   ESRD on hemodialysis  -HD TTS. -Appreciate renal input.  Aortic  stenosis  -Moderate aortic stenosis according to the gradient.   Procedures:  None   Consultations:  Renal for scheduled HD.  Discharge Instructions  Discharge Orders   Future Appointments Provider Department Dept Phone   02/28/2013 10:40 AM Tarri Fuller, PA-C [place] [phone]   Future Orders Complete By Expires   Discontinue IV  [name] directed    Increase activity slowly  [name] directed        Medication List    STOP taking these medications       midodrine 10 MG tablet  Commonly known [name]:  PROAMATINE      TAKE these medications       albuterol 90 MCG/ACT inhaler  Commonly known [name]:  PROVENTIL,VENTOLIN  Inhale 2 puffs into the lungs every 6 (six) hours [name] needed for wheezing. For shortness of breath     aspirin EC 81 MG tablet  Take 81 mg by mouth daily.     calcium carbonate 500 MG chewable tablet  Commonly known [name]:  TUMS - dosed in mg elemental calcium  Chew 1 tablet by mouth daily.     cinacalcet 30 MG tablet  Commonly known [name]:  SENSIPAR  Take 30 mg by mouth daily.     dextromethorphan-guaiFENesin 30-600 MG per 12 hr tablet  Commonly known [name]:  MUCINEX DM  Take 1 tablet by mouth 2 (two) times daily.     docusate sodium 100 MG capsule  Commonly known [name]:  COLACE  Take 100 mg by mouth 2 (two) times daily.  DULoxetine 60 MG capsule  Commonly known [name]:  CYMBALTA  Take 60 mg by mouth daily.     gabapentin 300 MG capsule  Commonly known [name]:  NEURONTIN  Take 300 mg by mouth daily.     guaiFENesin 600 MG 12 hr tablet  Commonly known [name]:  MUCINEX  Take 2 tablets (1,200 mg total) by mouth 2 (two) times daily.     insulin detemir 100 UNIT/ML injection  Commonly known [name]:  LEVEMIR  Inject 5 Units into the skin at bedtime [name] needed. Uses only if blood sugar levels are over 150     levofloxacin 750 MG tablet  Commonly known [name]:  LEVAQUIN  Take 1 tablet (750 mg total) by mouth every other day.     levothyroxine 150 MCG tablet   Commonly known [name]:  SYNTHROID, LEVOTHROID  Take 150 mcg by mouth daily before breakfast.     metoprolol tartrate 25 MG tablet  Commonly known [name]:  LOPRESSOR  Take 1 tablet (25 mg total) by mouth 2 (two) times daily.     multivitamin with minerals Tabs tablet  Take 1 tablet by mouth daily.     nicotine 7 mg/24hr patch  Commonly known [name]:  NICODERM CQ - dosed in mg/24 hr  Place 7 mg onto the skin daily.     nitroGLYCERIN 0.4 MG SL tablet  Commonly known [name]:  NITROSTAT  Place 0.4 mg under the tongue every 5 (five) minutes [name] needed for chest pain.     omeprazole 20 MG capsule  Commonly known [name]:  PRILOSEC  Take 20 mg by mouth daily.     polyethylene glycol packet  Commonly known [name]:  MIRALAX / GLYCOLAX  Take 17 g by mouth 2 (two) times daily.     senna 8.6 MG Tabs tablet  Commonly known [name]:  SENOKOT  Take 1 tablet by mouth every evening.     tacrolimus 1 MG capsule  Commonly known [name]:  PROGRAF  Take 5 mg by mouth 2 (two) times daily.     traMADol 50 MG tablet  Commonly known [name]:  ULTRAM  Take 50 mg by mouth 2 (two) times daily [name] needed (pain).       Allergies  Allergen Reactions  . Acetaminophen Other (See Comments)    Liver transplant recipient   . Codeine Itching      The results of significant diagnostics from this hospitalization (including imaging, microbiology, ancillary and laboratory) are listed below for reference.    Significant Diagnostic Studies: Dg Chest 2 View  02/21/2013   CLINICAL DATA:  Cough and congestion ; shortness of breath  EXAM: CHEST  2 VIEW  COMPARISON:  February 05, 2013  FINDINGS: There is no edema or consolidation. There is a rather minimal right effusion. Heart is upper normal in size with normal pulmonary vascularity. No adenopathy. There are surgical clips in the upper abdomen.  IMPRESSION: Minimal right effusion.  No edema or consolidation.   Electronically Signed   By: Lowella Grip M.D.   On: 02/21/2013 10:03   Dg Chest 2  View  02/05/2013   CLINICAL DATA:  Chest pain and pressure and nausea following acute dialysis today. The patient is a history of coronary artery disease and COPD.  EXAM: CHEST  2 VIEW  COMPARISON:  Portable chest x-ray of November 03, 2012.  FINDINGS: The lungs are adequately inflated. There is no focal infiltrate. There is minimal prominence of the pulmonary interstitial markings but these findings are  much improved over the study of November 03, 2012. The cardiopericardial silhouette is normal in size. The pulmonary vascularity is not engorged. There is no pleural effusion or pneumothorax. The observed portions of the bony thorax appear normal.  IMPRESSION: There is no evidence of pneumonia nor CHF nor other acute cardiopulmonary abnormality.   Electronically Signed   By: David  Martinique   On: 02/05/2013 12:31   Ct Head Wo Contrast  02/05/2013   CLINICAL DATA:  Headache  EXAM: CT HEAD WITHOUT CONTRAST  TECHNIQUE: Contiguous axial images were obtained from the base of the skull through the vertex without intravenous contrast.  COMPARISON:  06/15/2012  FINDINGS: No mass effect, midline shift, or acute intracranial hemorrhage. Minimal chronic ischemic changes in the periventricular white matter.  IMPRESSION: No acute intracranial pathology.   Electronically Signed   By: Maryclare Bean M.D.   On: 02/05/2013 13:14    Microbiology: No results found for this or any previous visit (from the past 240 hour(s)).   Labs: Basic Metabolic Panel:  Recent Labs Lab 02/21/13 1046 02/22/13 0520 02/23/13 0548  NA 139 140 135*  K 4.2 5.5* 4.4  CL 100 93* 91*  CO2  --  26 28  GLUCOSE 197* 269* 183*  BUN 41* 69* 42*  CREATININE 8.50* 10.19* 7.09*  CALCIUM  --  8.6 9.2   Liver Function Tests: No results found for this basename: AST, ALT, ALKPHOS, BILITOT, PROT, ALBUMIN,  in the last 168 hours No results found for this basename: LIPASE, AMYLASE,  in the last 168 hours No results found for this basename:  AMMONIA,  in the last 168 hours CBC:  Recent Labs Lab 02/21/13 1025 02/21/13 1046 02/22/13 0520 02/23/13 0548  WBC 8.9  --  11.8* 11.1*  NEUTROABS 7.4  --   --   --   HGB 11.5* 12.6 9.8* 10.1*  HCT 34.8* 37.0 29.4* 30.7*  MCV 97.8  --  96.4 97.5  PLT 117*  --  88* 96*   Cardiac Enzymes:  Recent Labs Lab 02/23/13 0058 02/23/13 0548 02/23/13 1131  TROPONINI <0.30 <0.30 <0.30   BNP: BNP (last 3 results)  Recent Labs  07/20/12 0154 08/30/12 0758 09/01/12 0555  PROBNP 42149.0* 41152.0* 32077.0*   CBG:  Recent Labs Lab 02/22/13 1354 02/22/13 1610 02/22/13 2131 02/23/13 0759 02/23/13 1129  GLUCAP 217* 329* 224* 137* 222*       Signed:  Lelon Frohlich  [facility] Pager: [phone] 02/23/2013, 1:45 PM

## 2013-02-23 NOTE — Progress Notes (Addendum)
[facility] Progress Note  Subjective:   Homes to go home  Objective Filed Vitals:   02/22/13 2045 02/22/13 2106 02/22/13 2231 02/23/13 0543  BP:  157/75 205/92 136/51  Pulse:  86 162 72  Temp:  98.4 F (36.9 C) 97.6 F (36.4 C) 97.8 F (36.6 C)  TempSrc:  Oral Oral Oral  Resp:  18 18 17   Height:      Weight:  76.1 kg (167 lb 12.3 oz)    SpO2: 98% 99% 98% 99%   Physical Exam General: depressed affect; breathing easily on room air Heart: RRR 3/6 murmur Lungs: no wheezes or rales but did start to cough when I asked her to take a deep breath Abdomen: soft NT Extremities: no LE edema Dialysis Access: Left thigh AVGG + bruit Dialysis Orders: Center: [facility] TTS .  EDW 74.5kg (gets +/- EDW with wt gains 2-4 kg)HD Bath 2K/2.25Ca Time 4:00 Heparin NONE. Access L thigh AVG BFR 400 DFR A1.5 Hectorol 4 mcg IV/HD Epogen 1000 q wk Units IV/HD Venofer 50 q wk  Recent labs: Hgb 11.6, Tsat 36%, P 6.1, PTH 194  Assessment/Plan:  1. Low grade fever, SOB, dry cough/HCAP- relatively sudden onset Sundayt risk for atypical PNA due to transplant status; Sats ok now on O2; WBC ^  but got IV steroids yesterday empiric Vanc and Zoysn 2. ESRD - TTS -  K 4.4 - HD in am if not d/c today 3. Hypertension/volume - BP variable - d/c on metoprolol 25 bid per discussion on d/c summary but 12.5 bid was on d/c med list; BP at dialysis are a little elevated pre HD but come down during treatment; EDW also ^ 1 kg last admission; on discussion with her, it appears she is taking metoprolol 37.5 bid - as per Rx bottle and not as on her d/c med list; I think we need to challenge volume and maybe lower edw and maybe metoprolol given AS; also on midodrine for BP support pre HD; currently on metoprolol 25 bid - BP ^ monitor during HD today to see how she responds to volume removal. Net UF 4 L yesterday with post weight of  73 Kg, then reweighed last night 76 kg ??; suspect 73 was accurate; BP variable but improved  overall: will lower EDW at d/c 4. Anemia - Hgb 11.5 adm - only on Epo 1000 per week;  continue weekly IV Fe; Hgb dropped to 9.8 since yesterday Repeat Hgb still down at 10.1 with volume removal - check Hemocult and dose Aranesp 25 on Thursday 5. Metabolic bone disease - Controlled with current meds; hectorol 4; takes tums - vague about dose and sensipar 30; not renvela even though it is on her outpt med list - have d/c'd and changed to tums 6. Nutrition -  renal diet 7. Thrombocytopenia - Platelets 117K> 88 >96- follow; on no heparin HD  8. 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 Anxiety/depression - On ativan and cymbalta 10. Hypothyroidism - on replacement tx 11. Card issues: mod AS with EF 45-50% 07/2012 - EKG on adm no sig change from prior; Trop I 0.04 12. Tobacco abuse - on patch 13. DM - per primary - BS up  Myriam Jacobson, PA-C [place] [phone] 02/23/2013,9:41 AM  LOS: 2 days   I have seen and examined patient, discussed with PA and agree with assessment and plan as outlined above. [name] MD pager [phone]  cell [phone] 02/23/2013, 12:47 PM    Additional Objective Labs: Basic Metabolic Panel:  Recent Labs Lab 02/21/13 1046 02/22/13 0520 02/23/13 0548  NA 139 140 135*  K 4.2 5.5* 4.4  CL 100 93* 91*  CO2  --  26 28  GLUCOSE 197* 269* 183*  BUN 41* 69* 42*  CREATININE 8.50* 10.19* 7.09*  CALCIUM  --  8.6 9.2   CBC:  Recent Labs Lab 02/21/13 1025 02/21/13 1046 02/22/13 0520 02/23/13 0548  WBC 8.9  --  11.8* 11.1*  NEUTROABS 7.4  --   --   --   HGB 11.5* 12.6 9.8* 10.1*  HCT 34.8* 37.0 29.4* 30.7*  MCV 97.8  --  96.4 97.5  PLT 117*  --  88* 96*  Cardiac Enzymes:  Recent Labs Lab 02/23/13 0058 02/23/13 0548  TROPONINI <0.30 <0.30   CBG:  Recent Labs Lab 02/22/13 1312 02/22/13 1354 02/22/13 1610 02/22/13 2131 02/23/13 0759  GLUCAP 187* 217* 329* 224* 137*   Medications:   .  albuterol  2.5 mg Nebulization TID  . aspirin EC  81 mg Oral Daily  . calcium carbonate  400 mg of elemental calcium Oral TID WC  . cinacalcet  30 mg Oral Q breakfast  . doxercalciferol  4 mcg Intravenous Q T,Th,Sa-HD  . DULoxetine  60 mg Oral Daily  . ferric gluconate (FERRLECIT/NULECIT) IV  125 mg Intravenous Q Tue-HD  . guaiFENesin  1,200 mg Oral BID  . heparin  5,000 Units Subcutaneous Q8H  . insulin aspart  0-9 Units Subcutaneous TID WC  . insulin detemir  5 Units Subcutaneous QHS  . levothyroxine  150 mcg Oral QAC breakfast  . metoprolol tartrate  25 mg Oral BID  . multivitamin with minerals  1 tablet Oral Daily  . nicotine  7 mg Transdermal Daily  . pantoprazole  40 mg Oral Daily  . piperacillin-tazobactam (ZOSYN)  IV  2.25 g Intravenous Q8H  . polyethylene glycol  17 g Oral BID  . sodium chloride  3 mL Intravenous Q12H  . sucralfate  1 g Oral BID  . tacrolimus  5 mg Oral BID  . vancomycin  750 mg Intravenous Q T,Th,Sa-HD

## 2013-02-24 DIAGNOSIS — N186 End stage renal disease: Secondary | ICD-10-CM | POA: Diagnosis not present

## 2013-02-24 DIAGNOSIS — N039 Chronic nephritic syndrome with unspecified morphologic changes: Secondary | ICD-10-CM | POA: Diagnosis not present

## 2013-02-24 DIAGNOSIS — N2581 Secondary hyperparathyroidism of renal origin: Secondary | ICD-10-CM | POA: Diagnosis not present

## 2013-02-24 DIAGNOSIS — D631 Anemia in chronic kidney disease: Secondary | ICD-10-CM | POA: Diagnosis not present

## 2013-02-24 DIAGNOSIS — D509 Iron deficiency anemia, unspecified: Secondary | ICD-10-CM | POA: Diagnosis not present

## 2013-02-26 DIAGNOSIS — N2581 Secondary hyperparathyroidism of renal origin: Secondary | ICD-10-CM | POA: Diagnosis not present

## 2013-02-26 DIAGNOSIS — D631 Anemia in chronic kidney disease: Secondary | ICD-10-CM | POA: Diagnosis not present

## 2013-02-26 DIAGNOSIS — D509 Iron deficiency anemia, unspecified: Secondary | ICD-10-CM | POA: Diagnosis not present

## 2013-02-26 DIAGNOSIS — N186 End stage renal disease: Secondary | ICD-10-CM | POA: Diagnosis not present

## 2013-02-26 DIAGNOSIS — N039 Chronic nephritic syndrome with unspecified morphologic changes: Secondary | ICD-10-CM | POA: Diagnosis not present

## 2013-02-28 ENCOUNTER — Ambulatory Visit (INDEPENDENT_AMBULATORY_CARE_PROVIDER_SITE_OTHER): Payer: Medicare Other | Admitting: Physician Assistant

## 2013-02-28 ENCOUNTER — Encounter: Payer: Self-pay | Admitting: Physician Assistant

## 2013-02-28 VITALS — BP 132/90 | HR 70 | Ht 64.0 in | Wt 170.0 lb

## 2013-02-28 DIAGNOSIS — Z720 Tobacco use: Secondary | ICD-10-CM

## 2013-02-28 DIAGNOSIS — I10 Essential (primary) hypertension: Secondary | ICD-10-CM

## 2013-02-28 DIAGNOSIS — R0989 Other specified symptoms and signs involving the circulatory and respiratory systems: Secondary | ICD-10-CM

## 2013-02-28 DIAGNOSIS — I5042 Chronic combined systolic (congestive) and diastolic (congestive) heart failure: Secondary | ICD-10-CM | POA: Diagnosis not present

## 2013-02-28 DIAGNOSIS — I359 Nonrheumatic aortic valve disorder, unspecified: Secondary | ICD-10-CM

## 2013-02-28 DIAGNOSIS — R0609 Other forms of dyspnea: Secondary | ICD-10-CM

## 2013-02-28 DIAGNOSIS — F17200 Nicotine dependence, unspecified, uncomplicated: Secondary | ICD-10-CM

## 2013-02-28 DIAGNOSIS — N186 End stage renal disease: Secondary | ICD-10-CM

## 2013-02-28 DIAGNOSIS — I509 Heart failure, unspecified: Secondary | ICD-10-CM

## 2013-02-28 DIAGNOSIS — R079 Chest pain, unspecified: Secondary | ICD-10-CM

## 2013-02-28 DIAGNOSIS — E785 Hyperlipidemia, unspecified: Secondary | ICD-10-CM

## 2013-02-28 DIAGNOSIS — R0600 Dyspnea, unspecified: Secondary | ICD-10-CM

## 2013-02-28 DIAGNOSIS — I350 Nonrheumatic aortic (valve) stenosis: Secondary | ICD-10-CM

## 2013-02-28 DIAGNOSIS — E119 Type 2 diabetes mellitus without complications: Secondary | ICD-10-CM

## 2013-02-28 DIAGNOSIS — Z992 Dependence on renal dialysis: Secondary | ICD-10-CM

## 2013-02-28 NOTE — Progress Notes (Signed)
Date:  02/28/2013   ID:  [name], DOB 07-30-1955, MRN [id number]  PCP:  Charlynn Court, NP  Primary Cardiologist:  Ellyn Hack.      History of Present Illness: [name] is a 58 y.o. female with PMH of hepatitis C, cirrhosis 2/2 EtOH abuse s/p liver transplant in 2011, ESRD on HD TTS ([facility] in [place]), DM2, HTN, CAD, GERD, COPD, hypothyroidism who presented with chest pain on 02/05/13. She has a history of two normal caths recently. She underwent a LHC in November 2013, by Dr. Gwenlyn Found, revealing normal coronaries and again by Dr. Ellyn Hack in June 2014 which showed a moderate lesion in the mid LAD (40-50%) stenosis, not flow limiting.   Last echo in June 2014 showed EF of 45-50%, grade two diastolic dysfunction, moderate AS with valve area of 0.76cm^2.   She presented to [facility] hospital on 12/27 after developing left-sided chest pain during HD, that was worrisome for unstable angina. She was admitted by [facility] and placed on IV heparin. EKG demonstrated no acute changes. Enzymes were negative x 3. Her pain ultimately resolved and she is now CP free. IV heparin has been discontinued.     She was recently hospitalized with Bronchitis/PNA and treated with Levaquin.  She presents today for follow up.  She reports continued CP about once per week which she takes NTG.   This helps about 30 mins after taking it but if she doesn't take it, it gets worse wit nausea.  She describes the pain as " weight on top of my chest".  + diaphoresis, SOB, 2 pillow orthopnea.  Her weight is up 11 pounds since Sept 2014.  ROS:  The patient currently denies vomiting, fever, dizziness, PND, cough, congestion, abdominal pain, hematochezia, melena, lower extremity edema, claudication.  Wt Readings from Last 3 Encounters:  02/28/13 170 lb (77.111 kg)  02/22/13 167 lb 12.3 oz (76.1 kg)  02/07/13 163 lb 9.3 oz (74.2 kg)     Past Medical History  Diagnosis Date  . S/P liver transplant     2011 at [facility] (cirrhosis due  to hep C, got hep C from blood transfuion in 1980's per pt))  . Chronic back pain   . CAD (coronary artery disease)   . Obesity   . Peripheral vascular disease hands and legs  . Anxiety   . Asthma   . GERD (gastroesophageal reflux disease)     takes Omeprazole daily  . Chronic constipation     takes MIralax and Colace daily  . Anemia     takes Folic Acid daily  . Hypothyroidism     takes Synthroid daily  . Depression     takes Cymbalta for "severe" depression  . Neuromuscular disorder     carpal tunnel in right hand  . Hypertension     takes Metoprolol and Lisinopril daily, sees Dr Bea Graff  . COPD (chronic obstructive pulmonary disease)   . Pneumonia     "today and several times before" (08/30/2012)  . Chronic bronchitis     "q yr w/season changes" (08/30/2012)  . Type II diabetes mellitus     Levemir 2units daily if > 150  . History of blood transfusion     "several" (08/30/2012)  . Hepatitis C   . Migraine     "last migraine was in 2013" (08/30/2012)  . Headache     "at least monthly" (08/30/2012)  . Arthritis     "left hand, back" (08/30/2012)  . End  stage renal disease on dialysis 02/27/2011    Kidneys shut down at time of liver transplant in Sept 2011 at [facility] in [place], she has been on HD ever since.  Dialyzes at [facility] HD on TTS schedule.  Had L forearm graft used 10 months then removed Dec 2012 due to suspected infection.  A right upper arm AVG was placed Dec 2012 but she developed steal symptoms acutely and it was ligated the same day.  Never had an AV fistula due to small veins.  Now has L thigh AVG put in Jan 2013, has not clotted to date.      Current Outpatient Prescriptions  Medication Sig Dispense Refill  . albuterol (PROVENTIL,VENTOLIN) 90 MCG/ACT inhaler Inhale 2 puffs into the lungs every 6 (six) hours as needed for wheezing. For shortness of breath      . aspirin EC 81 MG tablet Take 81 mg by mouth daily.        . calcium carbonate (TUMS - DOSED IN MG  ELEMENTAL CALCIUM) 500 MG chewable tablet Chew 1 tablet by mouth daily.      . cinacalcet (SENSIPAR) 30 MG tablet Take 30 mg by mouth daily.      [name] docusate sodium (COLACE) 100 MG capsule Take 100 mg by mouth 2 (two) times daily.        . DULoxetine (CYMBALTA) 60 MG capsule Take 60 mg by mouth daily.        [name] gabapentin (NEURONTIN) 300 MG capsule Take 300 mg by mouth daily.       . insulin detemir (LEVEMIR) 100 UNIT/ML injection Inject 5 Units into the skin at bedtime as needed. Uses only if blood sugar levels are over 150      . levothyroxine (SYNTHROID, LEVOTHROID) 150 MCG tablet Take 150 mcg by mouth daily before breakfast.      . LORazepam (ATIVAN) 0.5 MG tablet       . metoprolol tartrate (LOPRESSOR) 25 MG tablet Take 1 tablet (25 mg total) by mouth 2 (two) times daily.  60 tablet  1  . Multiple Vitamin (MULTIVITAMIN WITH MINERALS) TABS tablet Take 1 tablet by mouth daily.      . nicotine (NICODERM CQ - DOSED IN MG/24 HR) 7 mg/24hr patch Place 7 mg onto the skin daily.      . nitroGLYCERIN (NITROSTAT) 0.4 MG SL tablet Place 0.4 mg under the tongue every 5 (five) minutes as needed for chest pain.      [name] omeprazole (PRILOSEC) 20 MG capsule Take 20 mg by mouth daily.      . polyethylene glycol (MIRALAX / GLYCOLAX) packet Take 17 g by mouth 2 (two) times daily.       . promethazine (PHENERGAN) 12.5 MG tablet       . senna (SENOKOT) 8.6 MG TABS tablet Take 1 tablet by mouth every evening.      . tacrolimus (PROGRAF) 1 MG capsule Take 5 mg by mouth 2 (two) times daily.      . traMADol (ULTRAM) 50 MG tablet Take 50 mg by mouth 2 (two) times daily as needed (pain).       [name] dextromethorphan-guaiFENesin (MUCINEX DM) 30-600 MG per 12 hr tablet Take 1 tablet by mouth 2 (two) times daily.      [name] guaiFENesin (MUCINEX) 600 MG 12 hr tablet Take 2 tablets (1,200 mg total) by mouth 2 (two) times daily.      [name] levofloxacin (LEVAQUIN) 750 MG tablet Take 1 tablet (750  mg total) by mouth every other day.  3  tablet  0   No current facility-administered medications for this visit.    Allergies:    Allergies  Allergen Reactions  . Acetaminophen Other (See Comments)    Liver transplant recipient   . Codeine Itching    Social History:  The patient  reports that she has been smoking Cigarettes.  She has a 30 pack-year smoking history. She has never used smokeless tobacco. She reports that she drinks alcohol. She reports that she does not use illicit drugs.   Family history:   Family History  Problem Relation Age of Onset  . Cancer Mother   . Diabetes Mother   . Hypertension Mother   . Stroke Mother   . Cancer Father   . Anesthesia problems Neg Hx   . Hypotension Neg Hx   . Malignant hyperthermia Neg Hx   . Pseudochol deficiency Neg Hx     ROS:  Please see the history of present illness.  All other systems reviewed and negative.   PHYSICAL EXAM: VS:  BP 132/90  Pulse 70  Ht 5\' 4"  (1.626 m)  Wt 170 lb (77.111 kg)  BMI 29.17 kg/m2 Well nourished, well developed, in no acute distress HEENT: Pupils are equal round react to light accommodation extraocular movements are intact.  Neck: no JVDNo cervical lymphadenopathy. Cardiac: Regular rate and rhythm without murmurs rubs or gallops. Lungs:  clear to auscultation bilaterally, no wheezing, rhonchi or rales Abd: soft, nontender, positive bowel sounds all quadrants, no hepatosplenomegaly Ext: no lower extremity edema.  2+ radial and dorsalis pedis pulses. Skin: warm and dry Neuro:  Grossly normal  EKG:  Nonspecific TW changes similar to prior in December.  QTC 470ms  ASSESSMENT AND PLAN:  Problem List Items Addressed This Visit   Aortic stenosis, "moderate" per Dr C by 2D ehco 07/2012.  (Chronic)     I will repeat 2D echo and evaluate AS for progression.  Dyspnea seems to be progressing.      DIABETES MELLITUS, TYPE II (Chronic)   HTN- LVH, EF45- 50%. garde 2 diastolic dysf. 07/28/12 (Chronic)     BP controlled today.    End  stage renal disease on dialysis (Chronic)   Dyslipidemia-LDL 104, not on statin with Hx of liver transplant (Chronic)   Chronic combined systolic and diastolic congestive heart failure (Chronic)     Although her weight has increased since Sept, I do not think she is having an acute CHF exacerbation.  She has no signs of CHF(LEE, JVD, Rales, distended abd).       Chest pain - Primary     CP does not necessarily seem to be responsive to NTG but she is describing angina.  Recent cath, June 2014, showed only 40-50% LAD.  Will check a lexiscan myoview to assess.  May be demand ischemia related to hypotension/AS.  Patient continues to smoke as well.  BP looks good today.      Relevant Orders      EKG 12-Lead      Myocardial Perfusion Imaging   Tobacco abuse    Other Visit Diagnoses   Dyspnea        Relevant Orders       Myocardial Perfusion Imaging       2D Echocardiogram without contrast    Aortic stenosis, severe        Relevant Orders       2D Echocardiogram without contrast

## 2013-02-28 NOTE — Assessment & Plan Note (Signed)
BP controlled today

## 2013-02-28 NOTE — Patient Instructions (Signed)
1.   We will schedule you for an ultrasound of your heart and  Stress test.  2. Follow up with Dr. Ellyn Hack after the studies are complete.

## 2013-02-28 NOTE — Assessment & Plan Note (Signed)
Although her weight has increased since Sept, I do not think she is having an acute CHF exacerbation.  She has no signs of CHF(LEE, JVD, Rales, distended abd).

## 2013-02-28 NOTE — Assessment & Plan Note (Addendum)
CP does not necessarily seem to be responsive to NTG but she is describing angina.  Recent cath, June 2014, showed only 40-50% LAD.  Will check a lexiscan myoview to assess.  May be demand ischemia related to hypotension/AS.  Patient continues to smoke as well.  BP looks good today.

## 2013-02-28 NOTE — Assessment & Plan Note (Addendum)
I will repeat 2D echo and evaluate AS for progression.  Dyspnea seems to be progressing.

## 2013-03-01 DIAGNOSIS — D509 Iron deficiency anemia, unspecified: Secondary | ICD-10-CM | POA: Diagnosis not present

## 2013-03-01 DIAGNOSIS — N039 Chronic nephritic syndrome with unspecified morphologic changes: Secondary | ICD-10-CM | POA: Diagnosis not present

## 2013-03-01 DIAGNOSIS — N186 End stage renal disease: Secondary | ICD-10-CM | POA: Diagnosis not present

## 2013-03-01 DIAGNOSIS — D631 Anemia in chronic kidney disease: Secondary | ICD-10-CM | POA: Diagnosis not present

## 2013-03-01 DIAGNOSIS — N2581 Secondary hyperparathyroidism of renal origin: Secondary | ICD-10-CM | POA: Diagnosis not present

## 2013-03-03 DIAGNOSIS — N2581 Secondary hyperparathyroidism of renal origin: Secondary | ICD-10-CM | POA: Diagnosis not present

## 2013-03-03 DIAGNOSIS — D631 Anemia in chronic kidney disease: Secondary | ICD-10-CM | POA: Diagnosis not present

## 2013-03-03 DIAGNOSIS — D509 Iron deficiency anemia, unspecified: Secondary | ICD-10-CM | POA: Diagnosis not present

## 2013-03-03 DIAGNOSIS — N186 End stage renal disease: Secondary | ICD-10-CM | POA: Diagnosis not present

## 2013-03-03 DIAGNOSIS — N039 Chronic nephritic syndrome with unspecified morphologic changes: Secondary | ICD-10-CM | POA: Diagnosis not present

## 2013-03-05 DIAGNOSIS — N2581 Secondary hyperparathyroidism of renal origin: Secondary | ICD-10-CM | POA: Diagnosis not present

## 2013-03-05 DIAGNOSIS — N039 Chronic nephritic syndrome with unspecified morphologic changes: Secondary | ICD-10-CM | POA: Diagnosis not present

## 2013-03-05 DIAGNOSIS — N186 End stage renal disease: Secondary | ICD-10-CM | POA: Diagnosis not present

## 2013-03-05 DIAGNOSIS — D509 Iron deficiency anemia, unspecified: Secondary | ICD-10-CM | POA: Diagnosis not present

## 2013-03-05 DIAGNOSIS — D631 Anemia in chronic kidney disease: Secondary | ICD-10-CM | POA: Diagnosis not present

## 2013-03-08 ENCOUNTER — Encounter: Payer: Self-pay | Admitting: Cardiology

## 2013-03-08 ENCOUNTER — Inpatient Hospital Stay (HOSPITAL_COMMUNITY)
Admission: AD | Admit: 2013-03-08 | Discharge: 2013-03-29 | DRG: 216 | Disposition: A | Discharge status: Skilled Nursing Facility | Payer: Medicare Other | Source: Other Acute Inpatient Hospital | Attending: Cardiothoracic Surgery | Admitting: Cardiothoracic Surgery

## 2013-03-08 DIAGNOSIS — D631 Anemia in chronic kidney disease: Secondary | ICD-10-CM | POA: Diagnosis present

## 2013-03-08 DIAGNOSIS — N189 Chronic kidney disease, unspecified: Secondary | ICD-10-CM

## 2013-03-08 DIAGNOSIS — I4729 Other ventricular tachycardia: Secondary | ICD-10-CM | POA: Diagnosis not present

## 2013-03-08 DIAGNOSIS — J9819 Other pulmonary collapse: Secondary | ICD-10-CM | POA: Diagnosis not present

## 2013-03-08 DIAGNOSIS — J449 Chronic obstructive pulmonary disease, unspecified: Secondary | ICD-10-CM | POA: Diagnosis present

## 2013-03-08 DIAGNOSIS — K219 Gastro-esophageal reflux disease without esophagitis: Secondary | ICD-10-CM | POA: Diagnosis present

## 2013-03-08 DIAGNOSIS — Z79899 Other long term (current) drug therapy: Secondary | ICD-10-CM | POA: Diagnosis not present

## 2013-03-08 DIAGNOSIS — E039 Hypothyroidism, unspecified: Secondary | ICD-10-CM | POA: Diagnosis present

## 2013-03-08 DIAGNOSIS — F411 Generalized anxiety disorder: Secondary | ICD-10-CM | POA: Diagnosis present

## 2013-03-08 DIAGNOSIS — E669 Obesity, unspecified: Secondary | ICD-10-CM | POA: Diagnosis present

## 2013-03-08 DIAGNOSIS — D509 Iron deficiency anemia, unspecified: Secondary | ICD-10-CM | POA: Diagnosis not present

## 2013-03-08 DIAGNOSIS — G8929 Other chronic pain: Secondary | ICD-10-CM | POA: Diagnosis present

## 2013-03-08 DIAGNOSIS — N186 End stage renal disease: Secondary | ICD-10-CM | POA: Diagnosis present

## 2013-03-08 DIAGNOSIS — E1122 Type 2 diabetes mellitus with diabetic chronic kidney disease: Secondary | ICD-10-CM | POA: Diagnosis present

## 2013-03-08 DIAGNOSIS — R002 Palpitations: Secondary | ICD-10-CM | POA: Diagnosis not present

## 2013-03-08 DIAGNOSIS — I120 Hypertensive chronic kidney disease with stage 5 chronic kidney disease or end stage renal disease: Secondary | ICD-10-CM | POA: Diagnosis present

## 2013-03-08 DIAGNOSIS — M549 Dorsalgia, unspecified: Secondary | ICD-10-CM | POA: Diagnosis not present

## 2013-03-08 DIAGNOSIS — J90 Pleural effusion, not elsewhere classified: Secondary | ICD-10-CM | POA: Diagnosis not present

## 2013-03-08 DIAGNOSIS — F17200 Nicotine dependence, unspecified, uncomplicated: Secondary | ICD-10-CM | POA: Diagnosis present

## 2013-03-08 DIAGNOSIS — Z944 Liver transplant status: Secondary | ICD-10-CM | POA: Diagnosis not present

## 2013-03-08 DIAGNOSIS — D62 Acute posthemorrhagic anemia: Secondary | ICD-10-CM | POA: Diagnosis not present

## 2013-03-08 DIAGNOSIS — I498 Other specified cardiac arrhythmias: Secondary | ICD-10-CM | POA: Diagnosis present

## 2013-03-08 DIAGNOSIS — F329 Major depressive disorder, single episode, unspecified: Secondary | ICD-10-CM | POA: Diagnosis not present

## 2013-03-08 DIAGNOSIS — J4489 Other specified chronic obstructive pulmonary disease: Secondary | ICD-10-CM | POA: Diagnosis present

## 2013-03-08 DIAGNOSIS — Z952 Presence of prosthetic heart valve: Secondary | ICD-10-CM

## 2013-03-08 DIAGNOSIS — Y849 Medical procedure, unspecified as the cause of abnormal reaction of the patient, or of later complication, without mention of misadventure at the time of the procedure: Secondary | ICD-10-CM | POA: Diagnosis not present

## 2013-03-08 DIAGNOSIS — I519 Heart disease, unspecified: Secondary | ICD-10-CM | POA: Diagnosis not present

## 2013-03-08 DIAGNOSIS — R0789 Other chest pain: Secondary | ICD-10-CM | POA: Diagnosis not present

## 2013-03-08 DIAGNOSIS — Z4682 Encounter for fitting and adjustment of non-vascular catheter: Secondary | ICD-10-CM | POA: Diagnosis not present

## 2013-03-08 DIAGNOSIS — Z5189 Encounter for other specified aftercare: Secondary | ICD-10-CM | POA: Diagnosis not present

## 2013-03-08 DIAGNOSIS — K089 Disorder of teeth and supporting structures, unspecified: Secondary | ICD-10-CM | POA: Diagnosis not present

## 2013-03-08 DIAGNOSIS — I359 Nonrheumatic aortic valve disorder, unspecified: Secondary | ICD-10-CM | POA: Diagnosis not present

## 2013-03-08 DIAGNOSIS — R5383 Other fatigue: Secondary | ICD-10-CM | POA: Diagnosis not present

## 2013-03-08 DIAGNOSIS — F3289 Other specified depressive episodes: Secondary | ICD-10-CM | POA: Diagnosis not present

## 2013-03-08 DIAGNOSIS — I119 Hypertensive heart disease without heart failure: Secondary | ICD-10-CM | POA: Diagnosis present

## 2013-03-08 DIAGNOSIS — N2581 Secondary hyperparathyroidism of renal origin: Secondary | ICD-10-CM | POA: Diagnosis not present

## 2013-03-08 DIAGNOSIS — R079 Chest pain, unspecified: Secondary | ICD-10-CM

## 2013-03-08 DIAGNOSIS — I509 Heart failure, unspecified: Secondary | ICD-10-CM | POA: Diagnosis present

## 2013-03-08 DIAGNOSIS — I5042 Chronic combined systolic (congestive) and diastolic (congestive) heart failure: Secondary | ICD-10-CM

## 2013-03-08 DIAGNOSIS — Z720 Tobacco use: Secondary | ICD-10-CM | POA: Diagnosis present

## 2013-03-08 DIAGNOSIS — I2510 Atherosclerotic heart disease of native coronary artery without angina pectoris: Secondary | ICD-10-CM | POA: Diagnosis present

## 2013-03-08 DIAGNOSIS — Z992 Dependence on renal dialysis: Secondary | ICD-10-CM

## 2013-03-08 DIAGNOSIS — J45909 Unspecified asthma, uncomplicated: Secondary | ICD-10-CM | POA: Diagnosis not present

## 2013-03-08 DIAGNOSIS — I472 Ventricular tachycardia, unspecified: Secondary | ICD-10-CM | POA: Diagnosis not present

## 2013-03-08 DIAGNOSIS — J811 Chronic pulmonary edema: Secondary | ICD-10-CM | POA: Diagnosis not present

## 2013-03-08 DIAGNOSIS — K5900 Constipation, unspecified: Secondary | ICD-10-CM | POA: Diagnosis present

## 2013-03-08 DIAGNOSIS — Z794 Long term (current) use of insulin: Secondary | ICD-10-CM | POA: Diagnosis not present

## 2013-03-08 DIAGNOSIS — Z951 Presence of aortocoronary bypass graft: Secondary | ICD-10-CM

## 2013-03-08 DIAGNOSIS — E785 Hyperlipidemia, unspecified: Secondary | ICD-10-CM | POA: Diagnosis present

## 2013-03-08 DIAGNOSIS — I5023 Acute on chronic systolic (congestive) heart failure: Secondary | ICD-10-CM | POA: Diagnosis not present

## 2013-03-08 DIAGNOSIS — B1920 Unspecified viral hepatitis C without hepatic coma: Secondary | ICD-10-CM | POA: Diagnosis present

## 2013-03-08 DIAGNOSIS — R404 Transient alteration of awareness: Secondary | ICD-10-CM | POA: Diagnosis not present

## 2013-03-08 DIAGNOSIS — I517 Cardiomegaly: Secondary | ICD-10-CM | POA: Diagnosis not present

## 2013-03-08 DIAGNOSIS — E7800 Pure hypercholesterolemia, unspecified: Secondary | ICD-10-CM | POA: Diagnosis not present

## 2013-03-08 DIAGNOSIS — I739 Peripheral vascular disease, unspecified: Secondary | ICD-10-CM | POA: Diagnosis not present

## 2013-03-08 DIAGNOSIS — Z01810 Encounter for preprocedural cardiovascular examination: Secondary | ICD-10-CM | POA: Diagnosis not present

## 2013-03-08 DIAGNOSIS — E119 Type 2 diabetes mellitus without complications: Secondary | ICD-10-CM | POA: Diagnosis present

## 2013-03-08 DIAGNOSIS — E875 Hyperkalemia: Secondary | ICD-10-CM | POA: Diagnosis not present

## 2013-03-08 DIAGNOSIS — Z01818 Encounter for other preprocedural examination: Secondary | ICD-10-CM | POA: Diagnosis not present

## 2013-03-08 DIAGNOSIS — J988 Other specified respiratory disorders: Secondary | ICD-10-CM | POA: Diagnosis not present

## 2013-03-08 DIAGNOSIS — I350 Nonrheumatic aortic (valve) stenosis: Secondary | ICD-10-CM

## 2013-03-08 DIAGNOSIS — R5381 Other malaise: Secondary | ICD-10-CM | POA: Diagnosis not present

## 2013-03-08 DIAGNOSIS — R918 Other nonspecific abnormal finding of lung field: Secondary | ICD-10-CM | POA: Diagnosis not present

## 2013-03-08 DIAGNOSIS — I10 Essential (primary) hypertension: Secondary | ICD-10-CM | POA: Diagnosis not present

## 2013-03-08 DIAGNOSIS — Z9889 Other specified postprocedural states: Secondary | ICD-10-CM | POA: Diagnosis not present

## 2013-03-08 DIAGNOSIS — R0602 Shortness of breath: Secondary | ICD-10-CM | POA: Diagnosis not present

## 2013-03-08 DIAGNOSIS — Z452 Encounter for adjustment and management of vascular access device: Secondary | ICD-10-CM | POA: Diagnosis not present

## 2013-03-08 LAB — CBC
HCT: 31.4 % — ABNORMAL LOW (ref 36.0–46.0)
Hemoglobin: 10.6 g/dL — ABNORMAL LOW (ref 12.0–15.0)
MCH: 32.4 pg (ref 26.0–34.0)
MCHC: 33.8 g/dL (ref 30.0–36.0)
MCV: 96 fL (ref 78.0–100.0)
Platelets: 127 10*3/uL — ABNORMAL LOW (ref 150–400)
RBC: 3.27 MIL/uL — ABNORMAL LOW (ref 3.87–5.11)
RDW: 13.9 % (ref 11.5–15.5)
WBC: 6.9 10*3/uL (ref 4.0–10.5)

## 2013-03-08 LAB — COMPREHENSIVE METABOLIC PANEL
ALT: 18 U/L (ref 0–35)
AST: 22 U/L (ref 0–37)
Albumin: 2.9 g/dL — ABNORMAL LOW (ref 3.5–5.2)
Alkaline Phosphatase: 68 U/L (ref 39–117)
BUN: 20 mg/dL (ref 6–23)
CO2: 29 mEq/L (ref 19–32)
Calcium: 8.6 mg/dL (ref 8.4–10.5)
Chloride: 98 mEq/L (ref 96–112)
Creatinine, Ser: 5.34 mg/dL — ABNORMAL HIGH (ref 0.50–1.10)
GFR calc Af Amer: 9 mL/min — ABNORMAL LOW (ref >90)
GFR calc non Af Amer: 8 mL/min — ABNORMAL LOW (ref >90)
Glucose, Bld: 126 mg/dL — ABNORMAL HIGH (ref 70–99)
Potassium: 3.8 mEq/L (ref 3.7–5.3)
Sodium: 142 mEq/L (ref 137–147)
Total Bilirubin: 0.3 mg/dL (ref 0.3–1.2)
Total Protein: 6.9 g/dL (ref 6.0–8.3)

## 2013-03-08 LAB — GLUCOSE, CAPILLARY: Glucose-Capillary: 165 mg/dL — ABNORMAL HIGH (ref 70–99)

## 2013-03-08 LAB — MRSA PCR SCREENING: MRSA by PCR: NEGATIVE

## 2013-03-08 LAB — PROTIME-INR
INR: 0.92 (ref 0.00–1.49)
Prothrombin Time: 12.2 seconds (ref 11.6–15.2)

## 2013-03-08 LAB — TROPONIN I: Troponin I: <0.3 ng/mL (ref <0.30)

## 2013-03-08 MED ORDER — GABAPENTIN 300 MG PO CAPS
300.0000 mg | ORAL_CAPSULE | Freq: Every day | ORAL | Status: DC
  Administered 2013-03-08 – 2013-03-28 (×20): 300 mg via ORAL
  Filled 2013-03-08 (×22): qty 1

## 2013-03-08 MED ORDER — NITROGLYCERIN 0.4 MG SL SUBL
0.4000 mg | SUBLINGUAL_TABLET | SUBLINGUAL | Status: DC | PRN
  Administered 2013-03-12: 0.4 mg via SUBLINGUAL

## 2013-03-08 MED ORDER — CALCIUM CARBONATE ANTACID 500 MG PO CHEW
1.0000 | CHEWABLE_TABLET | Freq: Every day | ORAL | Status: DC
  Administered 2013-03-09 – 2013-03-29 (×20): 200 mg via ORAL
  Filled 2013-03-08 (×22): qty 1

## 2013-03-08 MED ORDER — TRAMADOL HCL 50 MG PO TABS
50.0000 mg | ORAL_TABLET | Freq: Two times a day (BID) | ORAL | Status: DC | PRN
  Administered 2013-03-10 – 2013-03-13 (×2): 50 mg via ORAL
  Filled 2013-03-08 (×2): qty 1

## 2013-03-08 MED ORDER — LEVOTHYROXINE SODIUM 150 MCG PO TABS
150.0000 ug | ORAL_TABLET | Freq: Every day | ORAL | Status: DC
  Administered 2013-03-09 – 2013-03-29 (×20): 150 ug via ORAL
  Filled 2013-03-08 (×23): qty 1

## 2013-03-08 MED ORDER — DULOXETINE HCL 60 MG PO CPEP
60.0000 mg | ORAL_CAPSULE | Freq: Every day | ORAL | Status: DC
  Administered 2013-03-08 – 2013-03-28 (×20): 60 mg via ORAL
  Filled 2013-03-08 (×23): qty 1

## 2013-03-08 MED ORDER — ADULT MULTIVITAMIN W/MINERALS CH
1.0000 | ORAL_TABLET | Freq: Every day | ORAL | Status: DC
  Administered 2013-03-09 – 2013-03-29 (×20): 1 via ORAL
  Filled 2013-03-08 (×21): qty 1

## 2013-03-08 MED ORDER — PANTOPRAZOLE SODIUM 40 MG PO TBEC
40.0000 mg | DELAYED_RELEASE_TABLET | Freq: Every day | ORAL | Status: DC
  Administered 2013-03-09 – 2013-03-14 (×6): 40 mg via ORAL
  Filled 2013-03-08 (×6): qty 1

## 2013-03-08 MED ORDER — CINACALCET HCL 30 MG PO TABS
30.0000 mg | ORAL_TABLET | Freq: Every day | ORAL | Status: DC
  Administered 2013-03-09 – 2013-03-29 (×19): 30 mg via ORAL
  Filled 2013-03-08 (×23): qty 1

## 2013-03-08 MED ORDER — INSULIN ASPART 100 UNIT/ML ~~LOC~~ SOLN
0.0000 [IU] | Freq: Three times a day (TID) | SUBCUTANEOUS | Status: DC
  Administered 2013-03-09: 3 [IU] via SUBCUTANEOUS
  Administered 2013-03-09: 2 [IU] via SUBCUTANEOUS
  Administered 2013-03-10: 5 [IU] via SUBCUTANEOUS
  Administered 2013-03-10 – 2013-03-11 (×2): 2 [IU] via SUBCUTANEOUS
  Administered 2013-03-11 – 2013-03-12 (×2): 5 [IU] via SUBCUTANEOUS
  Administered 2013-03-13: 2 [IU] via SUBCUTANEOUS
  Administered 2013-03-13: 8 [IU] via SUBCUTANEOUS
  Administered 2013-03-13: 2 [IU] via SUBCUTANEOUS
  Administered 2013-03-14: 5 [IU] via SUBCUTANEOUS
  Administered 2013-03-14: 2 [IU] via SUBCUTANEOUS

## 2013-03-08 MED ORDER — SENNA 8.6 MG PO TABS
1.0000 | ORAL_TABLET | Freq: Every evening | ORAL | Status: DC
  Administered 2013-03-09 – 2013-03-14 (×6): 8.6 mg via ORAL
  Filled 2013-03-08 (×8): qty 1

## 2013-03-08 MED ORDER — NICOTINE 7 MG/24HR TD PT24
7.0000 mg | MEDICATED_PATCH | Freq: Every day | TRANSDERMAL | Status: DC
  Administered 2013-03-09 – 2013-03-29 (×20): 7 mg via TRANSDERMAL
  Filled 2013-03-08 (×22): qty 1

## 2013-03-08 MED ORDER — DOCUSATE SODIUM 100 MG PO CAPS
100.0000 mg | ORAL_CAPSULE | Freq: Every day | ORAL | Status: DC
  Administered 2013-03-09 – 2013-03-14 (×5): 100 mg via ORAL
  Filled 2013-03-08 (×7): qty 1

## 2013-03-08 MED ORDER — CINACALCET HCL 30 MG PO TABS: 30.0000 mg | ORAL_TABLET | Freq: Every day | ORAL | Status: DC

## 2013-03-08 MED ORDER — TACROLIMUS 1 MG PO CAPS
5.0000 mg | ORAL_CAPSULE | Freq: Two times a day (BID) | ORAL | Status: DC
  Administered 2013-03-08 – 2013-03-29 (×40): 5 mg via ORAL
  Filled 2013-03-08 (×46): qty 5

## 2013-03-08 MED ORDER — ASPIRIN EC 81 MG PO TBEC
81.0000 mg | DELAYED_RELEASE_TABLET | Freq: Every day | ORAL | Status: DC
  Administered 2013-03-08 – 2013-03-14 (×6): 81 mg via ORAL
  Filled 2013-03-08 (×9): qty 1

## 2013-03-08 MED ORDER — PROMETHAZINE HCL 25 MG PO TABS: 12.5000 mg | ORAL_TABLET | ORAL | Status: DC | PRN

## 2013-03-08 MED ORDER — METOPROLOL TARTRATE 25 MG PO TABS
25.0000 mg | ORAL_TABLET | Freq: Two times a day (BID) | ORAL | Status: DC
  Administered 2013-03-08 – 2013-03-14 (×12): 25 mg via ORAL
  Filled 2013-03-08 (×15): qty 1

## 2013-03-08 MED ORDER — LORAZEPAM 0.5 MG PO TABS
0.5000 mg | ORAL_TABLET | Freq: Every day | ORAL | Status: DC
  Administered 2013-03-08 – 2013-03-14 (×7): 0.5 mg via ORAL
  Filled 2013-03-08 (×7): qty 1

## 2013-03-08 MED ORDER — INSULIN ASPART 100 UNIT/ML ~~LOC~~ SOLN
0.0000 [IU] | Freq: Every day | SUBCUTANEOUS | Status: DC
  Administered 2013-03-14: 4 [IU] via SUBCUTANEOUS

## 2013-03-08 MED ORDER — ALBUTEROL SULFATE (2.5 MG/3ML) 0.083% IN NEBU: 3.0000 mL | INHALATION_SOLUTION | Freq: Four times a day (QID) | RESPIRATORY_TRACT | Status: DC | PRN

## 2013-03-08 NOTE — Progress Notes (Signed)
03/08/2013 9:36 PM   Internal Med NP made aware of hx, current bp of 120/75.pt states usual sbp is 140. Deferred to cardiology.  Pt had HD today with sbp in 60's resulting in chest pain per pt. cardiology notes recommends 12.5 lopressor post HD. Pt is ordered 25 po lopressor.  Cardiology paged   Mick Sell RN

## 2013-03-08 NOTE — H&P (Addendum)
Patient ID: HETHER DANKO MRN: [id number], DOB/AGE: 1955-11-24   Admit date: 03/08/2013   Primary Physician: Charlynn Court, NP Primary Cardiologist: Harmon Dun  HPI:   58 y/o who sees a cardiologist in [place] and is followed by the renal service for ESRD(on HD T/T/S). Her history includes tobacco abuse, liver transplant 2011 for Hep C, non obstructive coronary artery disease, obesity, anxiety, diabetes mellitus, hypertension, hyperlipidemia, and severe AS. She had a cath in Nov 2013 that showed no significant CAD, Patient was hospitalized in June 2014 with CHF exacerbation. She had cardiac catheterization on 07/29/2012 which showed moderate LAD disease with the 40-50% LAD that was not felt to be flow limiting by FFR and mild aortic stenosis, however, an 2-D echocardiogram revealed an EF of 45% to 50% and calculated aortic valve area of 0.7-0.8 cm. Dr Sallyanne Kuster felt the gradient (50/30) was  consistent with "moderate"  aortic stenosis.          She was admitted earlier this month with bronchitis and HCAP. She tells me that episode started with tachycardia after HD that lasted 20 minutes and was associated with chest pain. By the time EMS came it had resolved. She says since then she has being doing well, no fever, chest pain, SOB, or cough. Today after dialysis she was hypotensive and she was given IVF. When she got up to go home she had tachycardia and chest pain. This lasted about 15 minutes. By the time EMS arrived it had resolved. No telemetry strips or EKG were obtained. No she feels "washed out".     Problem List: Past Medical History  Diagnosis Date  . 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 Chronic back pain   . CAD (coronary artery disease)   . Obesity   . Peripheral vascular disease hands and legs  . Anxiety   . Asthma   . GERD (gastroesophageal reflux disease)     takes Omeprazole daily  . Chronic  constipation     takes MIralax and Colace daily  . Anemia     takes Folic Acid daily  . Hypothyroidism     takes Synthroid daily  . Depression     takes Cymbalta for "severe" depression  . Neuromuscular disorder     carpal tunnel in right hand  . Hypertension     takes Metoprolol and Lisinopril daily, sees Dr Bea Graff  . COPD (chronic obstructive pulmonary disease)   . Pneumonia     "today and several times before" (08/30/2012)  . Chronic bronchitis     "q yr w/season changes" (08/30/2012)  . Type II diabetes mellitus     Levemir 2units daily if > 150  . History of blood transfusion     "several" (08/30/2012)  . Hepatitis C   . Migraine     "last migraine was in 2013" (08/30/2012)  . Headache     "at least monthly" (08/30/2012)  . Arthritis     "left hand, back" (08/30/2012)  . End stage renal disease on dialysis 02/27/2011    Kidneys shut down at time of liver transplant in Sept 2011 at [facility] in [place], she has been on HD ever since.  Dialyzes at [facility] HD on TTS schedule.  Had L forearm graft used 10 months then removed Dec 2012 due to suspected infection.  A right upper arm AVG was placed Dec 2012 but  she developed steal symptoms acutely and it was ligated the same day.  Never had an AV fistula due to small veins.  Now has L thigh AVG put in Jan 2013, has not clotted to date.      Past Surgical History  Procedure Laterality Date  . Liver transplant  10/25/2009    sees Dr Ferol Luz 1 every 6 months, saw last in Dec 2013. [name] Coord [phone]  . Small intestine surgery  90's  . Thrombectomy    . Arteriovenous graft placement Left 10/03/10     forearm  . Avgg removal  12/23/2010    Procedure: REMOVAL OF ARTERIOVENOUS GORETEX GRAFT ([place]);  Surgeon: Elam Dutch, MD;  Location: [place];  Service: Vascular;  Laterality: Left;  procedure started @1736 -1852  . Insertion of dialysis catheter  12/23/2010    Procedure: INSERTION OF DIALYSIS CATHETER;  Surgeon: Elam Dutch, MD;  Location: [place];  Service: Vascular;  Laterality: Right;  Right Internal Jugular 28cm dialysis catheter insertion procedure time 1701-1720   . Cholecystectomy  1993  . Cystoscopy  1990's  . Spinal growth rods  2010    "put 2 metal rods in my back; they had detetriorated" (08/30/2012)  . Av fistula placement  01/29/2011    Procedure: INSERTION OF ARTERIOVENOUS (AV) GORE-TEX GRAFT ARM;  Surgeon: Elam Dutch, MD;  Location: [place];  Service: Vascular;  Laterality: Right;  . Av fistula placement  03/10/2011    Procedure: INSERTION OF ARTERIOVENOUS (AV) GORE-TEX GRAFT THIGH;  Surgeon: Elam Dutch, MD;  Location: [place];  Service: Vascular;  Laterality: Left;  . Tubal ligation  1990's  . Cardiac catheterization  2014     Allergies:  Allergies  Allergen Reactions  . Acetaminophen Other (See Comments)    Liver transplant recipient   . Codeine Itching     Home Medications No current facility-administered medications for this encounter.  See Med Rec   Family History  Problem Relation Age of Onset  . Cancer Mother   . Diabetes Mother   . Hypertension Mother   . Stroke Mother   . Cancer Father   . Anesthesia problems Neg Hx   . Hypotension Neg Hx   . Malignant hyperthermia Neg Hx   . Pseudochol deficiency Neg Hx      History   Social History  . Marital Status: Divorced    Spouse Name: N/A    Number of Children: N/A  . Years of Education: N/A   Occupational History  . Not on file.   Social History Main Topics  . Smoking status: Current Every Day Smoker -- 0.75 packs/day for 40 years    Types: Cigarettes  . Smokeless tobacco: Never Used  . Alcohol Use: Yes     Comment: 08/30/2012 "last drink was at a wedding July, 2014; had a small glass of wine; never had problems w/alcohol"  . Drug Use: No  . Sexual Activity: Not Currently   Other Topics Concern  . Not on file   Social History Narrative  . No narrative on file     Review of Systems: General:  negative for chills, fever, night sweats or weight changes.  Cardiovascular: negative for chest pain, dyspnea on exertion, edema, orthopnea, palpitations, paroxysmal nocturnal dyspnea or shortness of breath Dermatological: negative for rash Respiratory: negative for cough or wheezing Urologic: negative for hematuria Abdominal: negative for nausea, vomiting, diarrhea, bright red blood per rectum, melena, or hematemesis Neurologic: negative for visual changes,  syncope, or dizziness All other systems reviewed and are otherwise negative except as noted above.  Physical Exam: Blood pressure 141/67, pulse 71, temperature 98.5 F (36.9 C), temperature source Oral, resp. rate 18, SpO2 100.00%.  General appearance: alert, cooperative and no distress Neck: no JVD and transmitted murmur bilat Lungs: clear to auscultation bilaterally Heart: regular rate and rhythm and 4-8/ 6 systolic murmur AOV, LSB, diminnished S2 Abdomen: non tender trans abdominal surgical scar Extremities: non functioning AVF both upper extremities, Dialysis needle Lt thigh Pulses: 2+ and symmetric Skin: cool and dry Neurologic: Grossly normal    Labs:  No results found for this or any previous visit (from the past 24 hour(s)).   Radiology/Studies: Dg Chest 2 View  02/21/2013   CLINICAL DATA:  Cough and congestion ; shortness of breath  EXAM: CHEST  2 VIEW  COMPARISON:  February 05, 2013  FINDINGS: There is no edema or consolidation. There is a rather minimal right effusion. Heart is upper normal in size with normal pulmonary vascularity. No adenopathy. There are surgical clips in the upper abdomen.  IMPRESSION: Minimal right effusion.  No edema or consolidation.   Electronically Signed   By: Lowella Grip M.D.   On: 02/21/2013 10:03    EKG:NSR, NSST changes  ASSESSMENT AND PLAN:  Principal Problem:   Palpitations- run of symptomatic tachycardia after HD, lasted 15-20 minutes  Active Problems:   Chest pain- with  tachycardia   DIABETES MELLITUS, TYPE II   HTN- LVH, EF45- 50%. garde 2 diastolic dysf. 07/28/12   End stage renal disease on dialysis-TTS   CAD- moderate with 40% - 50% LAD Nov 2013, and June 2014   Aortic stenosis, "moderate" per Dr C by 2D ehco 07/2012.    Dyslipidemia-LDL 104, not on statin with Hx of liver transplant   Chronic combined systolic and diastolic congestive heart failure   COPD (chronic obstructive pulmonary disease)   HEPATITIS C- s/p liver transplant 2011   HYPOTHYROIDISM   ANEMIA, CHRONIC   GERD   Tobacco abuse   PLAN: Admit, check labs including TSH. She is on Lopressor 25 mg BID but doesn't take it till after dialysis. Consider taking 12.5 mg before dialysis then 12.5 mg after dialysis. Consider an event monitor.  We will need to notify renal of pt's admission in am.   Signed, Erlene Quan, PA-C 03/08/2013, 6:09 PM  Attending Note:   The patient was seen and examined.  Agree with assessment and plan as noted above.  Changes made to the above note as needed.  The patient has had several episodes that are identical. All of these episodes have occurred during or right after her dialysis session. She becomes very hypotensive during dialysis-systolic blood pressures in the 60s. The dialysis unit typically gives her back some IV saline. The symptoms improve somewhat. She then develops more symptoms toward the end of dialysis. Today she developed hyportension followed by severe tachycardia. This was followed by chest pain.  The patient has known severe diastolic dysfunction. I suspect that with her low volume and severe diastolic dysfunction that she has very poor forward cardiac output.   The aortic stenosis  further limits her forward cardiac output causing chest pain. I suspect that over the next 15 minutes as her body reequilibrates, and at that point she was able to get some relief.   On exam:  She has a radiation of her systolic murmur into the carotids. Cor:   3-4 crescendo - decrescendo murmur c/w AS  Delayed and blunted pulses in carotids and radial arteries.   I suspect that her aortic valve is the primary issue here. While the echocardiogram suggests that she has only moderate aortic stenosis, I think it in the setting of her severe diastolic dysfunction and significant fluid changes that are associated with dialysis, I think that the hemodynamics of her aortic stenosis become more significant.    I think it we should repeat the echocardiogram. We may also need to do a dobutamine echocardiogram to see if her aortic valve gradient increases with the addition of dobutamine. We may also need a TEE.      She may ultimately need an aortic valve replacement.   I agree with getting Troponin levels but I doubt this is due to ACS.   Thayer Headings, Brooke Bonito., MD, [facility] 03/08/2013, 7:03 PM Office - [phone] Pager [phone]

## 2013-03-08 NOTE — Progress Notes (Signed)
03/08/2013  10:03 PM   No changes to Lopressor. Give as ordered per Elias Else MD Pt aware.   Mick Sell RN

## 2013-03-09 ENCOUNTER — Encounter (HOSPITAL_COMMUNITY): Payer: Self-pay

## 2013-03-09 ENCOUNTER — Encounter (HOSPITAL_COMMUNITY): Payer: Medicare Other

## 2013-03-09 DIAGNOSIS — I359 Nonrheumatic aortic valve disorder, unspecified: Secondary | ICD-10-CM | POA: Diagnosis not present

## 2013-03-09 DIAGNOSIS — I350 Nonrheumatic aortic (valve) stenosis: Secondary | ICD-10-CM | POA: Insufficient documentation

## 2013-03-09 LAB — CBC
HCT: 37.2 % (ref 36.0–46.0)
Hemoglobin: 12.4 g/dL (ref 12.0–15.0)
MCH: 32.5 pg (ref 26.0–34.0)
MCHC: 33.3 g/dL (ref 30.0–36.0)
MCV: 97.4 fL (ref 78.0–100.0)
Platelets: 139 10*3/uL — ABNORMAL LOW (ref 150–400)
RBC: 3.82 MIL/uL — AB (ref 3.87–5.11)
RDW: 14 % (ref 11.5–15.5)
WBC: 5.9 10*3/uL (ref 4.0–10.5)

## 2013-03-09 LAB — TROPONIN I: Troponin I: <0.3 ng/mL (ref <0.30)

## 2013-03-09 LAB — GLUCOSE, CAPILLARY
GLUCOSE-CAPILLARY: 140 mg/dL — AB (ref 70–99)
Glucose-Capillary: 164 mg/dL — ABNORMAL HIGH (ref 70–99)
Glucose-Capillary: 174 mg/dL — ABNORMAL HIGH (ref 70–99)
Glucose-Capillary: 168 mg/dL — ABNORMAL HIGH (ref 70–99)

## 2013-03-09 LAB — CREATININE, SERUM
Creatinine, Ser: 7.58 mg/dL — ABNORMAL HIGH (ref 0.50–1.10)
GFR calc non Af Amer: 5 mL/min — ABNORMAL LOW (ref >90)
GFR, EST AFRICAN AMERICAN: 6 mL/min — AB (ref >90)

## 2013-03-09 LAB — TSH: TSH: 1.182 u[IU]/mL (ref 0.350–4.500)

## 2013-03-09 MED ORDER — LIDOCAINE HCL (PF) 1 % IJ SOLN
5.0000 mL | INTRAMUSCULAR | Status: DC | PRN
  Filled 2013-03-09: qty 5

## 2013-03-09 MED ORDER — ASPIRIN 81 MG PO CHEW
81.0000 mg | CHEWABLE_TABLET | ORAL | Status: AC
  Administered 2013-03-10: 81 mg via ORAL
  Filled 2013-03-09: qty 1

## 2013-03-09 MED ORDER — SODIUM CHLORIDE 0.9 % IV SOLN
INTRAVENOUS | Status: DC
  Administered 2013-03-10: 04:00:00 via INTRAVENOUS

## 2013-03-09 MED ORDER — SODIUM CHLORIDE 0.9 % IV SOLN
100.0000 mL | INTRAVENOUS | Status: DC | PRN
  Administered 2013-03-15: 07:00:00 via INTRAVENOUS

## 2013-03-09 MED ORDER — SODIUM CHLORIDE 0.9 % IV SOLN: 250.0000 mL | INTRAVENOUS | Status: DC | PRN

## 2013-03-09 MED ORDER — ALTEPLASE 2 MG IJ SOLR
2.0000 mg | Freq: Once | INTRAMUSCULAR | Status: AC | PRN
  Filled 2013-03-09: qty 2

## 2013-03-09 MED ORDER — DOBUTAMINE INFUSION FOR EP/ECHO/NUC (1000 MCG/ML)
0.0000 ug/kg/min | INTRAVENOUS | Status: DC
  Administered 2013-03-09: 10 ug/kg/min via INTRAVENOUS
  Filled 2013-03-09: qty 250

## 2013-03-09 MED ORDER — DIAZEPAM 2 MG PO TABS: 2.0000 mg | ORAL_TABLET | ORAL | Status: DC

## 2013-03-09 MED ORDER — PENTAFLUOROPROP-TETRAFLUOROETH EX AERO: 1.0000 "application " | INHALATION_SPRAY | CUTANEOUS | Status: DC | PRN

## 2013-03-09 MED ORDER — LIDOCAINE-PRILOCAINE 2.5-2.5 % EX CREA
1.0000 "application " | TOPICAL_CREAM | CUTANEOUS | Status: DC | PRN
  Filled 2013-03-09: qty 5

## 2013-03-09 MED ORDER — ENOXAPARIN SODIUM 30 MG/0.3ML ~~LOC~~ SOLN
30.0000 mg | SUBCUTANEOUS | Status: DC
  Administered 2013-03-09 – 2013-03-13 (×4): 30 mg via SUBCUTANEOUS
  Filled 2013-03-09 (×7): qty 0.3

## 2013-03-09 MED ORDER — HEPARIN SODIUM (PORCINE) 1000 UNIT/ML DIALYSIS
1000.0000 [IU] | INTRAMUSCULAR | Status: DC | PRN
  Filled 2013-03-09: qty 1

## 2013-03-09 MED ORDER — ATROPINE SULFATE 0.1 MG/ML IJ SOLN
INTRAMUSCULAR | Status: AC
  Filled 2013-03-09: qty 10

## 2013-03-09 MED ORDER — SODIUM CHLORIDE 0.9 % IJ SOLN: 3.0000 mL | INTRAMUSCULAR | Status: DC | PRN

## 2013-03-09 MED ORDER — SODIUM CHLORIDE 0.9 % IJ SOLN
3.0000 mL | Freq: Two times a day (BID) | INTRAMUSCULAR | Status: DC
Start: 2013-03-09 — End: 2013-03-10
  Administered 2013-03-09: 3 mL via INTRAVENOUS

## 2013-03-09 MED ORDER — NEPRO/CARBSTEADY PO LIQD
237.0000 mL | ORAL | Status: DC | PRN
  Filled 2013-03-09: qty 237

## 2013-03-09 NOTE — Progress Notes (Signed)
UR Completed Christoph Copelan Graves-Bigelow, RN,BSN [phone]  

## 2013-03-09 NOTE — Progress Notes (Signed)
  Echocardiogram 2D Echocardiogram and Echocardiogram Pharmacologic Stress Test has been performed.  Basilia Jumbo 03/09/2013, 1:38 PM

## 2013-03-09 NOTE — Progress Notes (Signed)
Patient ID: [name], female   DOB: Sep 12, 1955, 58 y.o.   MRN: [id number]    SUBJECTIVE:  Patient is currently off the floor having dobutamine stress echo to assess gradients for further assessment of her aortic stenosis.   Filed Vitals:   03/08/13 2100 03/09/13 0900 03/09/13 1026 03/09/13 1037  BP: 121/75  163/83   Pulse: 75     Temp: 98.2 F (36.8 C)     TempSrc: Oral     Resp:   18   Weight:  169 lb 15.6 oz (77.1 kg)    SpO2:    98%    No intake or output data in the 24 hours ending 03/09/13 1040  LABS: Basic Metabolic Panel:  Recent Labs  03/08/13 1823  NA 142  K 3.8  CL 98  CO2 29  GLUCOSE 126*  BUN 20  CREATININE 5.34*  CALCIUM 8.6   Liver Function Tests:  Recent Labs  03/08/13 1823  AST 22  ALT 18  ALKPHOS 68  BILITOT 0.3  PROT 6.9  ALBUMIN 2.9*   No results found for this basename: LIPASE, AMYLASE,  in the last 72 hours CBC:  Recent Labs  03/08/13 1823  WBC 6.9  HGB 10.6*  HCT 31.4*  MCV 96.0  PLT 127*   Cardiac Enzymes:  Recent Labs  03/08/13 1823 03/09/13 0450  TROPONINI <0.30 <0.30   BNP: No components found with this basename: POCBNP,  D-Dimer: No results found for this basename: DDIMER,  in the last 72 hours Hemoglobin A1C: No results found for this basename: HGBA1C,  in the last 72 hours Fasting Lipid Panel: No results found for this basename: CHOL, HDL, LDLCALC, TRIG, CHOLHDL, LDLDIRECT,  in the last 72 hours Thyroid Function Tests:  Recent Labs  03/08/13 1823  TSH 1.182    RADIOLOGY: Dg Chest 2 View  02/21/2013   CLINICAL DATA:  Cough and congestion ; shortness of breath  EXAM: CHEST  2 VIEW  COMPARISON:  February 05, 2013  FINDINGS: There is no edema or consolidation. There is a rather minimal right effusion. Heart is upper normal in size with normal pulmonary vascularity. No adenopathy. There are surgical clips in the upper abdomen.  IMPRESSION: Minimal right effusion.  No edema or consolidation.    Electronically Signed   By: Lowella Grip M.D.   On: 02/21/2013 10:03    TELEMETRY:   I have reviewed telemetry today March 01, 2013. There is normal sinus rhythm.   ASSESSMENT AND PLAN:     Palpitations- run of tachycardia after HD, lasted 15-20 minutes      So far the rhythm has been stable. Plan to continue to monitor.    HEPATITIS C- s/p liver transplant 2011   HYPOTHYROIDISM   DIABETES MELLITUS, TYPE II   ANEMIA, CHRONIC   HTN- LVH, EF45- 50%. garde 2 diastolic dysf. 07/28/12   GERD   End stage renal disease on dialysis-TTS   CAD- moderate with 40% - 50% LAD Nov 2013, and June 2014    Aortic stenosis, "moderate" per Dr C by 2D ehco 07/2012.      The patient is having a dobutamine echo today to assess gradients. Up to this point it has been felt that her aortic stenosis is not severe. Plan to a week data from today. I spoke with Dr. Marlou Porch who will most likely be reviewing the echos.    Dyslipidemia-LDL 104, not on statin with Hx of liver transplant   Chest pain-  with tachycardia   Chronic combined systolic and diastolic congestive heart failure   COPD (chronic obstructive pulmonary disease)   Tobacco abuse   Dola Argyle 03/09/2013 10:40 AM

## 2013-03-09 NOTE — Consult Note (Signed)
Renal Service Consult Note [place] 03/09/2013 Roney Jaffe D Requesting Physician:  Dr Acie Fredrickson  Reason for Consult:  ESRD patient admitted for  HPI: The patient is a 58 y.o. year-old with hx of DM, HTN, ESRD, severe AS and liver Tx for hep C in 2011.  Pt presented with chest pain. Dobutamine echo done today showed "severe" AS. Renal svc asked to see for dialysis needs. She takes HD on TTS schedule, last HD was yesterday. CXR not done, no SOB.   ROS  no rash  no HA  no blurred vision  no cough  no fevers  no abd pain, n/v/d  no hallucination  Past Medical History  Past Medical History  Diagnosis Date  . 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 Chronic back pain   . CAD (coronary artery disease)   . Obesity   . Peripheral vascular disease hands and legs  . Anxiety   . Asthma   . GERD (gastroesophageal reflux disease)     takes Omeprazole daily  . Chronic constipation     takes MIralax and Colace daily  . Anemia     takes Folic Acid daily  . Hypothyroidism     takes Synthroid daily  . Depression     takes Cymbalta for "severe" depression  . Neuromuscular disorder     carpal tunnel in right hand  . Hypertension     takes Metoprolol and Lisinopril daily, sees Dr Bea Graff  . COPD (chronic obstructive pulmonary disease)   . Pneumonia     "today and several times before" (08/30/2012)  . Chronic bronchitis     "q yr w/season changes" (08/30/2012)  . Type II diabetes mellitus     Levemir 2units daily if > 150  . History of blood transfusion     "several" (08/30/2012)  . Hepatitis C   . Migraine     "last migraine was in 2013" (08/30/2012)  . Headache     "at least monthly" (08/30/2012)  . Arthritis     "left hand, back" (08/30/2012)  . End stage renal disease on dialysis 02/27/2011    Kidneys shut down at time of liver transplant in Sept 2011 at [facility] in [place],  she has been on HD ever since.  Dialyzes at [facility] HD on TTS schedule.  Had L forearm graft used 10 months then removed Dec 2012 due to suspected infection.  A right upper arm AVG was placed Dec 2012 but she developed steal symptoms acutely and it was ligated the same day.  Never had an AV fistula due to small veins.  Now has L thigh AVG put in Jan 2013, has not clotted to date.     Past Surgical History  Past Surgical History  Procedure Laterality Date  . Liver transplant  10/25/2009    sees Dr Ferol Luz 1 every 6 months, saw last in Dec 2013. [name] Coord [phone]  . Small intestine surgery  90's  . Thrombectomy    . Arteriovenous graft placement Left 10/03/10     forearm  . Avgg removal  12/23/2010    Procedure: REMOVAL OF ARTERIOVENOUS GORETEX GRAFT ([place]);  Surgeon: Elam Dutch, MD;  Location: [place];  Service: Vascular;  Laterality: Left;  procedure started @1736 -1852  . Insertion of dialysis catheter  12/23/2010    Procedure: INSERTION OF DIALYSIS CATHETER;  Surgeon: Elam Dutch, MD;  Location: [place];  Service: Vascular;  Laterality: Right;  Right Internal Jugular 28cm dialysis catheter insertion procedure time 1701-1720   . Cholecystectomy  1993  . Cystoscopy  1990's  . Spinal growth rods  2010    "put 2 metal rods in my back; they had detetriorated" (08/30/2012)  . Av fistula placement  01/29/2011    Procedure: INSERTION OF ARTERIOVENOUS (AV) GORE-TEX GRAFT ARM;  Surgeon: Elam Dutch, MD;  Location: [place];  Service: Vascular;  Laterality: Right;  . Av fistula placement  03/10/2011    Procedure: INSERTION OF ARTERIOVENOUS (AV) GORE-TEX GRAFT THIGH;  Surgeon: Elam Dutch, MD;  Location: [place];  Service: Vascular;  Laterality: Left;  . Tubal ligation  1990's  . Cardiac catheterization  2014   Family History  Family History  Problem Relation Age of Onset  . Cancer Mother   . Diabetes Mother   . Hypertension Mother   . Stroke Mother   . Cancer Father    . Anesthesia problems Neg Hx   . Hypotension Neg Hx   . Malignant hyperthermia Neg Hx   . Pseudochol deficiency Neg Hx    Social History  reports that she has been smoking Cigarettes.  She has a 30 pack-year smoking history. She has never used smokeless tobacco. She reports that she drinks alcohol. She reports that she does not use illicit drugs. Allergies  Allergies  Allergen Reactions  . Acetaminophen Other (See Comments)    Liver transplant recipient   . Codeine Itching   Home medications Prior to Admission medications   Medication Sig Start Date End Date Taking? Authorizing Provider  albuterol (PROVENTIL,VENTOLIN) 90 MCG/ACT inhaler Inhale 1-2 puffs into the lungs every 6 (six) hours as needed for wheezing or shortness of breath.    Yes Historical Provider, MD  aspirin EC 81 MG tablet Take 81 mg by mouth daily.     Yes Historical Provider, MD  calcium carbonate (TUMS - DOSED IN MG ELEMENTAL CALCIUM) 500 MG chewable tablet Chew 1 tablet by mouth daily.   Yes Historical Provider, MD  cinacalcet (SENSIPAR) 30 MG tablet Take 30 mg by mouth daily.   Yes Historical Provider, MD  docusate sodium (COLACE) 100 MG capsule Take 100 mg by mouth daily.    Yes Historical Provider, MD  DULoxetine (CYMBALTA) 60 MG capsule Take 60 mg by mouth at bedtime.    Yes Historical Provider, MD  gabapentin (NEURONTIN) 300 MG capsule Take 300 mg by mouth at bedtime.    Yes Historical Provider, MD  insulin detemir (LEVEMIR) 100 UNIT/ML injection Inject 2-3 Units into the skin at bedtime as needed (high blood sugar). Uses only if blood sugar levels are over 150 12/25/11  Yes Nita Sells, MD  levothyroxine (SYNTHROID, LEVOTHROID) 150 MCG tablet Take 150 mcg by mouth daily before breakfast.   Yes Historical Provider, MD  LORazepam (ATIVAN) 0.5 MG tablet Take 0.5 mg by mouth at bedtime.  02/08/13  Yes Historical Provider, MD  metoprolol tartrate (LOPRESSOR) 25 MG tablet Take 1 tablet (25 mg total) by mouth 2  (two) times daily. 02/23/13  Yes Estela Leonie Green, MD  Multiple Vitamin (MULTIVITAMIN WITH MINERALS) TABS tablet Take 1 tablet by mouth daily.   Yes Historical Provider, MD  nicotine (NICODERM CQ - DOSED IN MG/24 HR) 7 mg/24hr patch Place 7 mg onto the skin daily.   Yes Historical Provider, MD  nitroGLYCERIN (NITROSTAT) 0.4 MG SL tablet Place 0.4  mg under the tongue every 5 (five) minutes as needed for chest pain.   Yes Historical Provider, MD  omeprazole (PRILOSEC) 20 MG capsule Take 20 mg by mouth daily.   Yes Historical Provider, MD  promethazine (PHENERGAN) 12.5 MG tablet Take 12.5 mg by mouth every 4 (four) hours as needed for nausea.  02/09/13  Yes Historical Provider, MD  senna (SENOKOT) 8.6 MG TABS tablet Take 1 tablet by mouth every evening.   Yes Historical Provider, MD  tacrolimus (PROGRAF) 1 MG capsule Take 5 mg by mouth 2 (two) times daily.   Yes Historical Provider, MD  traMADol (ULTRAM) 50 MG tablet Take 50 mg by mouth 2 (two) times daily as needed (pain).    Yes Historical Provider, MD   Liver Function Tests  Recent Labs Lab 03/08/13 1823  AST 22  ALT 18  ALKPHOS 68  BILITOT 0.3  PROT 6.9  ALBUMIN 2.9*   No results found for this basename: LIPASE, AMYLASE,  in the last 168 hours CBC  Recent Labs Lab 03/08/13 1823 03/09/13 1245  WBC 6.9 5.9  HGB 10.6* 12.4  HCT 31.4* 37.2  MCV 96.0 97.4  PLT 127* [id number]*   Basic Metabolic Panel  Recent Labs Lab 03/08/13 1823 03/09/13 1245  NA 142  --   K 3.8  --   CL 98  --   CO2 29  --   GLUCOSE 126*  --   BUN 20  --   CREATININE 5.34* 7.58*  CALCIUM 8.6  --     Exam  Blood pressure 157/74, pulse 71, temperature 97.4 F (36.3 C), temperature source Oral, resp. rate 18, weight 77.1 kg (169 lb 15.6 oz), SpO2 100.00%. Alert, no distress, sitting on side of bed No rash, cyanosis or gangrene Sclera anicteric, throat clear Neck no jvd, +bilat bruits and palpable thrill L carotid, delayed carotid upstroke Chest  clear bilat, no rales or wheezing RRR 2/6 SEM no rub or gallop Abd soft, nt/nd, no hsm No LE or UE edema L thigh AVG patent  Dialysis: TTS [place] 4h   74kg   2K/2.25 [place]   L thigh AVG   No heparin Hect 4  EPO 1000   Venofer 50 mg/wk  Assessment 1. Chest pain 2. Severe aortic stenosis 3. ESRD on hemodialysis 4. Anemia / CKD- Hb 12.4, hold esa for now 5. MBD / CKD, on sensipar, no binders 6. HTN/volume: 3 kg up, clear lungs 7. Liver transplant   Plan- Plan HD tonight in anticipation of heart cath procedure tomorrow.  Will follow.   [name] MD (pgr) [phone]    ([phone] 03/09/2013, 5:44 PM

## 2013-03-09 NOTE — OR Nursing (Signed)
Assisted with dobutamine study in echo. Drip started at 16mcg/kg/min per [facility], then increased to 14mcg. Once obtained chosen HR, order to stop study. VSS.

## 2013-03-09 NOTE — Progress Notes (Signed)
Patient ID: [name], female   DOB: May 31, 1955, 58 y.o.   MRN: [id number]    SUBJECTIVE:  Patient feels better today. Dobutamine echo study shows increased LV function and increased gradient. There is severe AS.   Filed Vitals:   03/09/13 1053 03/09/13 1056 03/09/13 1157 03/09/13 1425  BP: 113/60 127/60 149/91 157/74  Pulse:   66 71  Temp:    97.4 F (36.3 C)  TempSrc:    Oral  Resp: 18     Weight:      SpO2:    100%    No intake or output data in the 24 hours ending 03/09/13 1432  LABS: Basic Metabolic Panel:  Recent Labs  03/08/13 1823 03/09/13 1245  NA 142  --   K 3.8  --   CL 98  --   CO2 29  --   GLUCOSE 126*  --   BUN 20  --   CREATININE 5.34* 7.58*  CALCIUM 8.6  --    Liver Function Tests:  Recent Labs  03/08/13 1823  AST 22  ALT 18  ALKPHOS 68  BILITOT 0.3  PROT 6.9  ALBUMIN 2.9*   No results found for this basename: LIPASE, AMYLASE,  in the last 72 hours CBC:  Recent Labs  03/08/13 1823 03/09/13 1245  WBC 6.9 5.9  HGB 10.6* 12.4  HCT 31.4* 37.2  MCV 96.0 97.4  PLT 127* 139*   Cardiac Enzymes:  Recent Labs  03/08/13 1823 03/09/13 0450  TROPONINI <0.30 <0.30   BNP: No components found with this basename: POCBNP,  D-Dimer: No results found for this basename: DDIMER,  in the last 72 hours Hemoglobin A1C: No results found for this basename: HGBA1C,  in the last 72 hours Fasting Lipid Panel: No results found for this basename: CHOL, HDL, LDLCALC, TRIG, CHOLHDL, LDLDIRECT,  in the last 72 hours Thyroid Function Tests:  Recent Labs  03/08/13 1823  TSH 1.182    RADIOLOGY: Dg Chest 2 View  02/21/2013   CLINICAL DATA:  Cough and congestion ; shortness of breath  EXAM: CHEST  2 VIEW  COMPARISON:  February 05, 2013  FINDINGS: There is no edema or consolidation. There is a rather minimal right effusion. Heart is upper normal in size with normal pulmonary vascularity. No adenopathy. There are surgical clips in the upper abdomen.   IMPRESSION: Minimal right effusion.  No edema or consolidation.   Electronically Signed   By: Lowella Grip M.D.   On: 02/21/2013 10:03    PHYSICAL EXAM   Lungs clear, not edema, systolic murmur c/w AS.   TELEMETRY: I reviewed 03/09/2013. NSR   ASSESSMENT AND PLAN:     Chest pain- with tachycardia     Multifactorial. Probably some SVT. But there is severe AS.    HEPATITIS C- s/p liver transplant 2011   HYPOTHYROIDISM   DIABETES MELLITUS, TYPE II   ANEMIA, CHRONIC   HTN- LVH, EF45- 50%. garde 2 diastolic dysf. 07/28/12   GERD   End stage renal disease on dialysis-TTS    CAD- moderate with 40% - 50% LAD Nov 2013, and June 2014     Needs repeat cath to reassess for possible AVR    Aortic stenosis, "moderate" per Dr C by 2D ehco 07/2012.  But dobutamine echo today shows severe AS. I spoke with patient. I will ask Dr. Ellyn Hack for input. I will arrange cath for 03/10/2013. We will consult TCTS for surgical opinion.    Dyslipidemia-LDL 104,  not on statin with Hx of liver transplant   Chronic combined systolic and diastolic congestive heart failure   COPD (chronic obstructive pulmonary disease)   Tobacco abuse    Palpitations- run of tachycardia after HD, lasted 15-20 minutes     We still need to asses her rhythm further.   Marris Frontera 03/09/2013 2:32 PM  

## 2013-03-09 NOTE — Care Management Note (Signed)
    Page 1 of 2   03/29/2013     4:17:34 PM   CARE MANAGEMENT NOTE 03/29/2013  Patient:  [name], [name]   Account Number:  [id number]  Date Initiated:  03/09/2013  Documentation initiated by:  GRAVES-BIGELOW,BRENDA  Subjective/Objective Assessment:   Pt admitted for Palpitations- run of symptomatic tachycardia after HD, lasted 15-20 minutes.     Action/Plan:   CM will continue to monitor for disposition needs.   Anticipated DC Date:  03/29/2013   Anticipated DC Plan:  SKILLED NURSING FACILITY  In-house referral  Clinical Social Worker      DC Planning Services  CM consult      Choice offered to / List presented to:             Status of service:  Completed, signed off Medicare Important Message given?   (If response is "NO", the following Medicare IM given date fields will be blank) Date Medicare IM given:   Date Additional Medicare IM given:    Discharge Disposition:  [place]  Per UR Regulation:  Reviewed for med. necessity/level of care/duration of stay  If discussed at [facility] of Stay Meetings, dates discussed:   03/15/2013  03/24/2013    Comments:  Contact:  Chambers,[name] Daughter [phone]                 Chambers,Heather Daughter [phone]  03/29/13 Rome Schlauch,RN,BSN [phone] PT DISCHARGED TO SNF TODAY, PER CSW ARRANGEMENTS.  03/24/13 Kenecia Barren,RN,BSN [phone] CSW CONT TO FOLLOW TO FACILITATE DC TO SNF WHEN MEDICALLY STABLE.  2/6 1312 debbie dowell rn,bsn lives alone, may need snf for rehab. sw ref made.  03-15-13 3:15pm Luz Lex, RNBSN [phone] Post op CABG x1 and AVR today.  03-14-13 [address] Jacqlyn Krauss, [place] [phone] S/p cardiac cath 03-10-13 with angiogram-revealed severe aortic stenosis. Plan for HD Sat, Monday and AVR with mechanical valve 03-15-13. Disposition plan will be for SNF when medically stable.

## 2013-03-10 ENCOUNTER — Encounter (HOSPITAL_COMMUNITY)
Admission: AD | Disposition: A | Discharge status: Skilled Nursing Facility | Payer: Self-pay | Source: Other Acute Inpatient Hospital | Attending: Cardiothoracic Surgery

## 2013-03-10 ENCOUNTER — Other Ambulatory Visit: Payer: Self-pay | Admitting: *Deleted

## 2013-03-10 DIAGNOSIS — I2510 Atherosclerotic heart disease of native coronary artery without angina pectoris: Secondary | ICD-10-CM

## 2013-03-10 HISTORY — PX: LEFT HEART CATHETERIZATION WITH CORONARY ANGIOGRAM: SHX5451

## 2013-03-10 LAB — POCT I-STAT 3, VENOUS BLOOD GAS (G3P V)
Acid-Base Excess: 5 mmol/L — ABNORMAL HIGH (ref 0.0–2.0)
BICARBONATE: 31 meq/L — AB (ref 20.0–24.0)
O2 Saturation: 71 %
PH VEN: 7.382 — AB (ref 7.250–7.300)
PO2 VEN: 39 mmHg (ref 30.0–45.0)
TCO2: 33 mmol/L (ref 0–100)
pCO2, Ven: 52.1 mmHg — ABNORMAL HIGH (ref 45.0–50.0)

## 2013-03-10 LAB — POCT I-STAT 3, ART BLOOD GAS (G3+)
Acid-Base Excess: 4 mmol/L — ABNORMAL HIGH (ref 0.0–2.0)
Bicarbonate: 29.7 mEq/L — ABNORMAL HIGH (ref 20.0–24.0)
O2 Saturation: 94 %
PH ART: 7.405 (ref 7.350–7.450)
TCO2: 31 mmol/L (ref 0–100)
pCO2 arterial: 47.4 mmHg — ABNORMAL HIGH (ref 35.0–45.0)
pO2, Arterial: 72 mmHg — ABNORMAL LOW (ref 80.0–100.0)

## 2013-03-10 LAB — GLUCOSE, CAPILLARY
GLUCOSE-CAPILLARY: 140 mg/dL — AB (ref 70–99)
GLUCOSE-CAPILLARY: 153 mg/dL — AB (ref 70–99)
Glucose-Capillary: 206 mg/dL — ABNORMAL HIGH (ref 70–99)

## 2013-03-10 LAB — BASIC METABOLIC PANEL
BUN: 24 mg/dL — ABNORMAL HIGH (ref 6–23)
CHLORIDE: 96 meq/L (ref 96–112)
CO2: 28 meq/L (ref 19–32)
CREATININE: 5.84 mg/dL — AB (ref 0.50–1.10)
Calcium: 8.5 mg/dL (ref 8.4–10.5)
GFR calc non Af Amer: 7 mL/min — ABNORMAL LOW (ref >90)
GFR, EST AFRICAN AMERICAN: 8 mL/min — AB (ref >90)
Glucose, Bld: 142 mg/dL — ABNORMAL HIGH (ref 70–99)
POTASSIUM: 5.2 meq/L (ref 3.7–5.3)
SODIUM: 137 meq/L (ref 137–147)

## 2013-03-10 SURGERY — LEFT HEART CATHETERIZATION WITH CORONARY ANGIOGRAM
Anesthesia: LOCAL

## 2013-03-10 MED ORDER — SODIUM CHLORIDE 0.9 % IV SOLN: INTRAVENOUS | Status: DC

## 2013-03-10 MED ORDER — ONDANSETRON HCL 4 MG/2ML IJ SOLN: 4.0000 mg | Freq: Four times a day (QID) | INTRAMUSCULAR | Status: DC | PRN

## 2013-03-10 MED ORDER — FENTANYL CITRATE 0.05 MG/ML IJ SOLN
INTRAMUSCULAR | Status: AC
  Filled 2013-03-10: qty 2

## 2013-03-10 MED ORDER — MIDAZOLAM HCL 2 MG/2ML IJ SOLN
INTRAMUSCULAR | Status: AC
  Filled 2013-03-10: qty 2

## 2013-03-10 MED ORDER — BUDESONIDE-FORMOTEROL FUMARATE 160-4.5 MCG/ACT IN AERO
2.0000 | INHALATION_SPRAY | Freq: Two times a day (BID) | RESPIRATORY_TRACT | Status: DC
  Administered 2013-03-10 – 2013-03-14 (×8): 2 via RESPIRATORY_TRACT
  Filled 2013-03-10 (×2): qty 6

## 2013-03-10 MED ORDER — LIDOCAINE HCL (PF) 1 % IJ SOLN
INTRAMUSCULAR | Status: AC
  Filled 2013-03-10: qty 30

## 2013-03-10 MED ORDER — HEPARIN (PORCINE) IN NACL 2-0.9 UNIT/ML-% IJ SOLN
INTRAMUSCULAR | Status: AC
  Filled 2013-03-10: qty 1500

## 2013-03-10 MED ORDER — ASPIRIN EC 81 MG PO TBEC: 81.0000 mg | DELAYED_RELEASE_TABLET | Freq: Every day | ORAL | Status: DC

## 2013-03-10 MED ORDER — ACETAMINOPHEN 325 MG PO TABS: 650.0000 mg | ORAL_TABLET | ORAL | Status: DC | PRN

## 2013-03-10 MED ORDER — NITROGLYCERIN 0.2 MG/ML ON CALL CATH LAB
INTRAVENOUS | Status: AC
  Filled 2013-03-10: qty 1

## 2013-03-10 NOTE — Interval H&P Note (Signed)
History and Physical Interval Note:  03/10/2013 7:49 AM  [name]  has presented today for surgery, with the diagnosis of Chest pain and aortic stenosis. The various methods of treatment have been discussed with the patient and family. After consideration of risks, benefits and other options for treatment, the patient has consented to  Procedure(s): LEFT HEART CATHETERIZATION WITH CORONARY ANGIOGRAM (N/A) and right heart catheterizationCath Lab Visit (complete for each Cath Lab visit)  Clinical Evaluation Leading to the Procedure:   ACS: no  Non-ACS:    Anginal Classification: CCS IV  Anti-ischemic medical therapy: Maximal Therapy (2 or more classes of medications)  Non-Invasive Test Results: No non-invasive testing performed  Prior CABG: No previous CABG       as a surgical intervention .  The patient's history has been reviewed, patient examined, no change in status, stable for surgery.  I have reviewed the patient's chart and labs.  Questions were answered to the patient's satisfaction.     KELLY,THOMAS A

## 2013-03-10 NOTE — Progress Notes (Signed)
Subjective:  No complaints, no dyspnea or chest pain  Objective: Vital signs in last 24 hours: Temp:  [97.4 F (36.3 C)-97.8 F (36.6 C)] 97.4 F (36.3 C) (01/29 1325) Pulse Rate:  [64-79] 68 (01/29 1500) Resp:  [18-20] 20 (01/29 1325) BP: (136-171)/(60-100) 149/81 mmHg (01/29 1500) SpO2:  [100 %] 100 % (01/29 1500) Weight:  [74 kg (163 lb 2.3 oz)-75.2 kg (165 lb 12.6 oz)] 74.5 kg (164 lb 3.9 oz) (01/29 0645) Weight change:   Intake/Output from previous day: 01/28 0701 - 01/29 0700 In: 609.2 [P.O.:510; I.V.:99.2] Out: 1600  Intake/Output this shift: Total I/O In: 240 [P.O.:240] Out: -   Lab Results:  Recent Labs  03/08/13 1823 03/09/13 1245  WBC 6.9 5.9  HGB 10.6* 12.4  HCT 31.4* 37.2  PLT 127* 139*   BMET:  Recent Labs  03/08/13 1823 03/09/13 1245 03/10/13 1055  NA 142  --  137  K 3.8  --  5.2  CL 98  --  96  CO2 29  --  28  GLUCOSE 126*  --  142*  BUN 20  --  24*  CREATININE 5.34* 7.58* 5.84*  CALCIUM 8.6  --  8.5  ALBUMIN 2.9*  --   --    No results found for this basename: PTH,  in the last 72 hours Iron Studies: No results found for this basename: IRON, TIBC, TRANSFERRIN, FERRITIN,  in the last 72 hours  EXAM: General appearance: Alert, in no apparent distress Resp:  CTA without rales, rhonchi, or wheezes Cardio:  RRR with Gr II/VI systolic murmur, no rub GI:  + BS, soft and nontender Extremities:  No edema Access:   L thigh AVG with + bruit  Dialysis: T[facility]  4h 74kg 2K/2.25 [place] L thigh AVG No heparin  Hect 4 EPO 1000 Venofer 50 mg/wk  Assessment/Plan: 1. Chest pain  - angiography today showed normal LV fxn, mild obstructive disease, and severe aortic valve stenosis; AVR recommended. 2. ESRD - HD on TTS @ [facility], K 5.2.  Next HD tomorrow or 1/31. 3. HTN/Volume - BP 152/85 on Metoprolol 25 mg bid; wt 74.5 kg with EDW 74. 4. Anemia - Hgb 12.4, stable. 5. 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  6. DM - insulin per primary. 7. Liver  transplant - on Prograf   LOS: 2 days   LYLES,CHARLES 03/10/2013,4:38 PM  I have seen and examined patient, discussed with PA and agree with assessment and plan as outlined above. [name] MD pager [phone]    cell [phone] 03/11/2013, 8:20 AM

## 2013-03-10 NOTE — H&P (View-Only) (Signed)
Patient ID: [name], female   DOB: May 31, 1955, 58 y.o.   MRN: [id number]    SUBJECTIVE:  Patient feels better today. Dobutamine echo study shows increased LV function and increased gradient. There is severe AS.   Filed Vitals:   03/09/13 1053 03/09/13 1056 03/09/13 1157 03/09/13 1425  BP: 113/60 127/60 149/91 157/74  Pulse:   66 71  Temp:    97.4 F (36.3 C)  TempSrc:    Oral  Resp: 18     Weight:      SpO2:    100%    No intake or output data in the 24 hours ending 03/09/13 1432  LABS: Basic Metabolic Panel:  Recent Labs  03/08/13 1823 03/09/13 1245  NA 142  --   K 3.8  --   CL 98  --   CO2 29  --   GLUCOSE 126*  --   BUN 20  --   CREATININE 5.34* 7.58*  CALCIUM 8.6  --    Liver Function Tests:  Recent Labs  03/08/13 1823  AST 22  ALT 18  ALKPHOS 68  BILITOT 0.3  PROT 6.9  ALBUMIN 2.9*   No results found for this basename: LIPASE, AMYLASE,  in the last 72 hours CBC:  Recent Labs  03/08/13 1823 03/09/13 1245  WBC 6.9 5.9  HGB 10.6* 12.4  HCT 31.4* 37.2  MCV 96.0 97.4  PLT 127* 139*   Cardiac Enzymes:  Recent Labs  03/08/13 1823 03/09/13 0450  TROPONINI <0.30 <0.30   BNP: No components found with this basename: POCBNP,  D-Dimer: No results found for this basename: DDIMER,  in the last 72 hours Hemoglobin A1C: No results found for this basename: HGBA1C,  in the last 72 hours Fasting Lipid Panel: No results found for this basename: CHOL, HDL, LDLCALC, TRIG, CHOLHDL, LDLDIRECT,  in the last 72 hours Thyroid Function Tests:  Recent Labs  03/08/13 1823  TSH 1.182    RADIOLOGY: Dg Chest 2 View  02/21/2013   CLINICAL DATA:  Cough and congestion ; shortness of breath  EXAM: CHEST  2 VIEW  COMPARISON:  February 05, 2013  FINDINGS: There is no edema or consolidation. There is a rather minimal right effusion. Heart is upper normal in size with normal pulmonary vascularity. No adenopathy. There are surgical clips in the upper abdomen.   IMPRESSION: Minimal right effusion.  No edema or consolidation.   Electronically Signed   By: Lowella Grip M.D.   On: 02/21/2013 10:03    PHYSICAL EXAM   Lungs clear, not edema, systolic murmur c/w AS.   TELEMETRY: I reviewed 03/09/2013. NSR   ASSESSMENT AND PLAN:     Chest pain- with tachycardia     Multifactorial. Probably some SVT. But there is severe AS.    HEPATITIS C- s/p liver transplant 2011   HYPOTHYROIDISM   DIABETES MELLITUS, TYPE II   ANEMIA, CHRONIC   HTN- LVH, EF45- 50%. garde 2 diastolic dysf. 07/28/12   GERD   End stage renal disease on dialysis-TTS    CAD- moderate with 40% - 50% LAD Nov 2013, and June 2014     Needs repeat cath to reassess for possible AVR    Aortic stenosis, "moderate" per Dr C by 2D ehco 07/2012.  But dobutamine echo today shows severe AS. I spoke with patient. I will ask Dr. Ellyn Hack for input. I will arrange cath for 03/10/2013. We will consult TCTS for surgical opinion.    Dyslipidemia-LDL 104,  not on statin with Hx of liver transplant   Chronic combined systolic and diastolic congestive heart failure   COPD (chronic obstructive pulmonary disease)   Tobacco abuse    Palpitations- run of tachycardia after HD, lasted 15-20 minutes     We still need to asses her rhythm further.   Dola Argyle 03/09/2013 2:32 PM

## 2013-03-10 NOTE — CV Procedure (Signed)
[name] is a 58 y.o. female    [id number]  [id number] LOCATION:  FACILITY: [facility]  PHYSICIAN: Troy Sine, MD, [facility] [birth year]   DATE OF PROCEDURE:  03/10/2013    CARDIAC CATHETERIZATION     HISTORY:  [name] is a 58 year old African American female who was previously found to have 40-50% LAD stenosis as well as moderate aortic valve stenosis. She has a history of liver transplant in 2011 for hepatitis C, end-stage renal disease on dialysis, diabetes mellitus, hypertension, hyperlipidemia, and tobacco use. He was recently admitted with increasing chest pain. A dobutamine echo Doppler study suggested severe AS with an ejection fraction of 45% an initial mean gradient of 26. With 10 mcg of dobutamine  the mean gradient increased to 30 and with 15 mcg of dobutamine increase further to 47 mmHg. Ejection fraction also improved. She now presents for right and left heart cardiac catheterization.   PROCEDURE:  The patient was brought to the second floor [place] [facility]iac cath lab in the postabsorptive state. She was prepped and draped in sterile fashion. The right femoral artery and right femoral vein were punctured anteriorly and a 7 French venous sheath and 6 French arterial sheath were inserted. The Swan-Ganz catheter was advanced through the venous sheath to the right atrium, right ventricle, pulmonary artery, pulmonary capillary wedge positions under hemodynamic monitoring. O2 saturation was obtained in the pulmonary artery and in the aorta. Cardiac output determinations were done both via thermodilution method as well as the assumed Fick method. A pigtail catheter was inserted and simultaneous central aorta and pulmonary artery pressures were recorded. A straight wire, the pigtail catheter was able to be successfully advanced into the left ventricle. Left ventricular and femoral artery pressures were recorded simultaneously. Left ventriculography was performed utilizing 25 cc of  Omnipaque contrast at a flow rate of 12 cc per second. An LV to AO pullback was performed. Supravalvular aortography was also performed using 20 cc of contrast. Attention was then directed at the coronary anatomy. Diagnostic coronary angiography was performed utilizing 5 French Judkins 4 left and right coronary catheters. Hemostasis was obtained by direct manual pressure. The patient tolerated the procedure well.  HEMODYNAMICS:   RA: mean 7 RV: 35/7 PA: 35/17 Pc: a 19 v 17, mean 15  Initial AO: 170/81          LV: 199:15  Pullback: LV: 195/23                 Ao: 171/78  CO:    3.7 (Thermo);   5.4 (Fick) CI:      2.0                     3.0 AVA:   0.8 cm2            1.2 cm2     ANGIOGRAPHY:  1. Left main: Angiographically normal vessel which bifurcated into the LAD and left circumflex coronary artery  2. LAD: Moderate size vessel that gave rise to a proximal diagonal vessel. There was a smooth 50% ostial narrowing in this diagonal vessel. The LAD between the first and second diagonal vessel had luminal irregularities and narrowing of 10-20%. The LAD extended to the LV apex. 3. Left circumflex: Angiographically normal vessel which gave rise to 2 small marginal vessels.  4. Right coronary artery: Angiographically normal dominant vessel which gave rise to a large PDA and PLA system with 2 small inferior LV branches.  Left ventriculography  revealed lethargic hypertrophy. Ejection fraction was approximately 50%, without focal segmental wall motion abnormalities or mitral regurgitation.  Supravalvular aortography revealed moderately reduced aortic valve excursion but with mobility. There was no angiographic aortic insufficiency.  IMPRESSION:  Normal LV function with an ejection fraction of approximately 50% with evidence for left ventricular hypertrophy.  Mild coronary  obstructive disease with 50% narrowing at the ostium of the first diagonal branch of the LAD with 10-20% luminal  irregularities of the proximal to mid LAD and otherwise normal left circumflex and right coronary artery.  Moderately severe aortic valve stenosis with a peak to peak gradient ranging from 24-29 mmHg, and a mean gradient of 24 mm Hg. Aortic valve area calculates to 0.8-1.2 depending upon cardiac output determination consistent with moderately severe aortic valve stenosis.  RECOMMENDATION:   Angiographic findings will be reviewed the patient's primary cardiologist. In light of the patient's significant symptomatic status consideration for valve replacement surgery will be necessary.    Troy Sine, MD, [facility] 03/10/2013 9:41 AM

## 2013-03-10 NOTE — Progress Notes (Signed)
Day of Surgery Procedure(s) (LRB): LEFT HEART CATHETERIZATION WITH CORONARY ANGIOGRAM (N/A) Subjective: Patient examined and echo, cath reviewed Will need AVR/CABG next week Procedure d/w patient and family Objective: Vital signs in last 24 hours: Temp:  [97.4 F (36.3 C)-97.8 F (36.6 C)] 97.8 F (36.6 C) (01/29 1640) Pulse Rate:  [64-79] 72 (01/29 1640) Cardiac Rhythm:  [-] Normal sinus rhythm (01/29 1026) Resp:  [18-20] 20 (01/29 1325) BP: (136-171)/(60-100) 152/85 mmHg (01/29 1640) SpO2:  [100 %] 100 % (01/29 1640) Weight:  [163 lb 2.3 oz (74 kg)-165 lb 12.6 oz (75.2 kg)] 164 lb 3.9 oz (74.5 kg) (01/29 0645)  Hemodynamic parameters for last 24 hours:  nsr  Intake/Output from previous day: 01/28 0701 - 01/29 0700 In: 609.2 [P.O.:510; I.V.:99.2] Out: 1600  Intake/Output this shift:    EXAM Lungs w/ distant bs 3/6 SEM  Lab Results:  Recent Labs  03/08/13 1823 03/09/13 1245  WBC 6.9 5.9  HGB 10.6* 12.4  HCT 31.4* 37.2  PLT 127* 139*   BMET:  Recent Labs  03/08/13 1823 03/09/13 1245 03/10/13 1055  NA 142  --  137  K 3.8  --  5.2  CL 98  --  96  CO2 29  --  28  GLUCOSE 126*  --  142*  BUN 20  --  24*  CREATININE 5.34* 7.58* 5.84*  CALCIUM 8.6  --  8.5    PT/INR:  Recent Labs  03/08/13 1823  LABPROT 12.2  INR 0.92   ABG    Component Value Date/Time   PHART 7.405 03/10/2013 0838   HCO3 29.7* 03/10/2013 0838   TCO2 31 03/10/2013 0838   O2SAT 94.0 03/10/2013 0838   CBG (last 3)   Recent Labs  03/09/13 2307 03/10/13 1126 03/10/13 1702  GLUCAP 168* 140* 206*    Assessment/Plan: S/P Procedure(s) (LRB): LEFT HEART CATHETERIZATION WITH CORONARY ANGIOGRAM (N/A) AVR CABG next week   LOS: 2 days    VAN TRIGT III,Mya Suell 03/10/2013

## 2013-03-11 ENCOUNTER — Inpatient Hospital Stay (HOSPITAL_COMMUNITY): Payer: Medicare Other

## 2013-03-11 ENCOUNTER — Encounter (HOSPITAL_COMMUNITY): Payer: Self-pay | Admitting: Radiology

## 2013-03-11 DIAGNOSIS — I2510 Atherosclerotic heart disease of native coronary artery without angina pectoris: Secondary | ICD-10-CM

## 2013-03-11 DIAGNOSIS — Z01810 Encounter for preprocedural cardiovascular examination: Secondary | ICD-10-CM

## 2013-03-11 LAB — GLUCOSE, CAPILLARY
GLUCOSE-CAPILLARY: 156 mg/dL — AB (ref 70–99)
GLUCOSE-CAPILLARY: 216 mg/dL — AB (ref 70–99)
Glucose-Capillary: 135 mg/dL — ABNORMAL HIGH (ref 70–99)
Glucose-Capillary: 134 mg/dL — ABNORMAL HIGH (ref 70–99)
Glucose-Capillary: 150 mg/dL — ABNORMAL HIGH (ref 70–99)

## 2013-03-11 LAB — BLOOD GAS, ARTERIAL
Acid-Base Excess: 2.7 mmol/L — ABNORMAL HIGH (ref 0.0–2.0)
Bicarbonate: 27.1 mEq/L — ABNORMAL HIGH (ref 20.0–24.0)
Drawn by: 35135
FIO2: 0.21 %
O2 Saturation: 96.2 %
Patient temperature: 98.6
TCO2: 28.4 mmol/L (ref 0–100)
pCO2 arterial: 44 mmHg (ref 35.0–45.0)
pH, Arterial: 7.406 (ref 7.350–7.450)
pO2, Arterial: 82.3 mmHg (ref 80.0–100.0)

## 2013-03-11 LAB — HEMOGLOBIN A1C
Hgb A1c MFr Bld: 8 % — ABNORMAL HIGH (ref <5.7)
Mean Plasma Glucose: 183 mg/dL — ABNORMAL HIGH (ref <117)

## 2013-03-11 NOTE — Progress Notes (Signed)
Patient Name: [name] Date of Encounter: 03/11/2013     SUBJECTIVE: No chest pain, some DOE today, going to stay for AVR next week.  OBJECTIVE Filed Vitals:   03/10/13 2028 03/10/13 2129 03/11/13 0536 03/11/13 0823  BP: 107/47  153/72   Pulse: 72  71   Temp: 98.2 F (36.8 C)  98.1 F (36.7 C)   TempSrc: Oral  Oral   Resp:      Height:      Weight:   167 lb 11.2 oz (76.068 kg)   SpO2: 99% 99% 100% 99%    Intake/Output Summary (Last 24 hours) at 03/11/13 1206 Last data filed at 03/11/13 0925  Gross per 24 hour  Intake   1182 ml  Output      0 ml  Net   1182 ml   Filed Weights   03/09/13 2237 03/10/13 0645 03/11/13 0536  Weight: 163 lb 2.3 oz (74 kg) 164 lb 3.9 oz (74.5 kg) 167 lb 11.2 oz (76.068 kg)    PHYSICAL EXAM General: Well developed, well nourished, female in no acute distress. Head: Normocephalic, atraumatic.  Neck: Supple without bruits, JVD at 9 cm. Lungs:  Resp regular and unlabored, decreased BS bases, R>L. Heart: RRR, S1, S2, no S3, S4, typical AS murmur; no rub. Abdomen: Soft, non-tender, non-distended, BS + x 4.  Extremities: No clubbing, cyanosis, no edema.  Neuro: Alert and oriented X 3. Moves all extremities spontaneously. Psych: Normal affect.  LABS: CBC: Recent Labs  03/08/13 1823 03/09/13 1245  WBC 6.9 5.9  HGB 10.6* 12.4  HCT 31.4* 37.2  MCV 96.0 97.4  PLT 127* 139*   INR: Recent Labs  03/08/13 1823  INR 3.32   Basic Metabolic Panel: Recent Labs  03/08/13 1823 03/09/13 1245 03/10/13 1055  NA 142  --  137  K 3.8  --  5.2  CL 98  --  96  CO2 29  --  28  GLUCOSE 126*  --  142*  BUN 20  --  24*  CREATININE 5.34* 7.58* 5.84*  CALCIUM 8.6  --  8.5   Liver Function Tests: Recent Labs  03/08/13 1823  AST 22  ALT 18  ALKPHOS 68  BILITOT 0.3  PROT 6.9  ALBUMIN 2.9*   Cardiac Enzymes: Recent Labs  03/08/13 1823 03/09/13 0450  TROPONINI <0.30 <0.30   Thyroid Function Tests: Recent Labs   03/08/13 1823  TSH 1.182    TELE:  SR, no significant extopy      Radiology/Studies: Ct Chest Wo Contrast 03/11/2013   CLINICAL DATA:  58 -year-old female preoperative study for aortic valve replacement. Recent chest pain and weakness.  EXAM: CT CHEST WITHOUT CONTRAST  TECHNIQUE: Multi detector CT of the chest without contrast. Axial sagittal and coronal multiplanar reformatted images.  CONTRAST:  None.  COMPARISON:  Chest radiographs 03/08/2013 and earlier. Abdomen CT 01/29/2012 and earlier.  RADIOPHARMACEUTICALS:  None.  FLUOROSCOPY TIME:  None.  FINDINGS: Major airways are patent. Improved ventilation of both lung bases since the 2013 comparison. No pericardial or pleural effusion. No pulmonary consolidation.  No acute osseous abnormality.  Stable visualized upper abdominal viscera, numerous surgical clips about the liver compatible with prior liver transplant. No upper abdominal free fluid.  Calcified coronary artery atherosclerosis. Calcifications at the aortic valve plane. No mediastinal lymphadenopathy. Negative non contrast thoracic inlet. No axillary lymphadenopathy.  IMPRESSION: 1. No acute findings in the chest. 2. Calcified coronary artery atherosclerosis. Aortic valve calcifications.  3. Sequelae of liver transplant.   Electronically Signed   By: Lars Pinks M.D.   On: 03/11/2013 11:57     Current Medications:  . aspirin EC  81 mg Oral Daily  . budesonide-formoterol  2 puff Inhalation BID  . calcium carbonate  1 tablet Oral Daily  . cinacalcet  30 mg Oral Q breakfast  . docusate sodium  100 mg Oral Daily  . DULoxetine  60 mg Oral QHS  . enoxaparin (LOVENOX) injection  30 mg Subcutaneous Q24H  . gabapentin  300 mg Oral QHS  . insulin aspart  0-15 Units Subcutaneous TID WC  . insulin aspart  0-5 Units Subcutaneous QHS  . levothyroxine  150 mcg Oral QAC breakfast  . LORazepam  0.5 mg Oral QHS  . metoprolol tartrate  25 mg Oral BID  . multivitamin with minerals  1 tablet Oral  Daily  . nicotine  7 mg Transdermal Daily  . pantoprazole  40 mg Oral Daily  . senna  1 tablet Oral QPM  . tacrolimus  5 mg Oral BID      ASSESSMENT AND PLAN: Principal Problem:   Chest pain- with tachycardia - no more tachy, no brady. Continue BB.  We will continue to monitor the patient for tachycardia. This is part of her symptom complex. She will tolerate this better when her aortic valve was replaced.  Active Problems:   HEPATITIS C- s/p liver transplant 2011 - LFTs OK 01/27, recheck possibly before, definitely after surgery.    HYPOTHYROIDISM - TSH OK this admit, no dose change    DIABETES MELLITUS, TYPE II - on     ANEMIA, CHRONIC - mgt per renal team    HTN- LVH, EF45- 50%. grade 2 diastolic dysf. 07/28/12 - EF 50% at cath 01/29.     GERD - on PPI    End stage renal disease on dialysis-TTS - mgt per renal team    CAD- moderate with 40% - 50% LAD Nov 2013, and June 2014 - cath 03/10/2013 w/ D1 50%, luminal irreg in LAD, o/w no disease.    Aortic stenosis, with the current assessment including dobutamine echo it is felt that the patient has severe aortic stenosis. The plan is for aortic valve replacement. The patient is being kept in the hospital for this.    Dyslipidemia-LDL 104, not on statin with Hx of liver transplant, follow    Chronic combined systolic and diastolic congestive heart failure - volume mgt per renal team    COPD (chronic obstructive pulmonary disease) - PFTs performed, results pending    Tobacco abuse - cessation strongly advised    Palpitations- run of tachycardia after HD, lasted 15-20 minutes - no strips to review, rate not mentioned. Follow on telemetry, continue BB.   Signed, Rosaria Ferries , PA-C 12:06 PM 03/11/2013 Patient seen and examined. I agree with the assessment and plan as detailed above. See also my additional thoughts below.   I have modified some of the note above. Patient is stable. She will receive aortic valve replacement  while she is in the hospital.  Dola Argyle, MD, [facility] 03/11/2013 1:50 PM

## 2013-03-11 NOTE — Progress Notes (Signed)
CARDIAC REHAB PHASE I   PRE:  Rate/Rhythm: 66 SR    BP: sitting 144/80    SaO2: 100 RA  MODE:  Ambulation: 700 ft   POST:  Rate/Rhythm: 80 SR    BP: sitting 158/80     SaO2: 100 RA  Tolerated well, slightly unsteady but no LOB. Sts she was starting to get SOB after walk. Quick pace. Gave instructions on IS, sternal precautions, mobility after surgery. Can walk independently before surgery. Set up pre-op video. Pt will prob need SNF at d/c due to lack of 24 hr care. Family to begin thinking of where. Pt and daughter appreciated information. [phone]   Josephina Shih Kennedy CES, ACSM 03/11/2013 3:29 PM

## 2013-03-11 NOTE — Progress Notes (Addendum)
Pre-op Cardiac Surgery  Carotid Findings:  Findings suggest 1-39% internal carotid artery stenosis bilaterally. Vertebral arteries are patent with antegrade flow.  Upper Extremity Right Left  Brachial Pressures 161-Triphasic Triphasic  Radial Waveforms Biphasic Biphasic  Ulnar Waveforms Biphasic Biphasic  Palmar Arch (Allen's Test) Signal obliterates with radial compression, is unaffected with ulnar compression. Signal obliterates with both radial and ulnar compression.   Unable to obtain left brachial artery pressure due to restricted access.   Lower  Extremity Right Left  Dorsalis Pedis 149-Biphasic 129-Biphasic  Anterior Tibial    Posterior Tibial 170-Biphasic 138-Biphasic  Ankle/Brachial Indices 1.06 0.86    Findings:   The right ABI is within normal limits. The left ABI is suggestive of mild arterial insufficiency.    Right Lower Extremity Vein Map    Right Great Saphenous Vein   Segment Diameter Comment  1. Origin 5.43mm   2. High Thigh 4.9mm   3. Mid Thigh 4.79mm   4. Low Thigh 4.55mm   5. At Knee 3.55mm   6. High Calf 3.39mm   7. Low Calf 2.31mm   8. Ankle 2.66mm    mm    mm    mm    Unable to map left GSV due to graft site. The left LSV has multiple branches and cannot be mapped.    03/11/2013 1:02 PM Maudry Mayhew, RVT, RDCS, RDMS

## 2013-03-11 NOTE — Consult Note (Signed)
[address]       [place] [address]             [phone]        Algie F Thurgood [place] Medical Record [id number] Date of Birth: 1955-12-12  Referring: No ref. provider found Primary Care: Charlynn Court, NP\  Patient examined, Coronary angiogram and 2-D echocardiogram reviewed  Chief Complaint- chest pain, SOB  Present Illness:   58 year old African American female diabetic smoker on chronic dialysis with 2 months history of exertional chest pain and shortness of breath. She was briefly hospitalized 36 hours in December was told she had pneumonia. Most recently she has developed hypotension and chest pain during her hemodialysis sessions. She's had a cardiac catheterization within the past 6 months. Yesterday she was admitted for the above symptoms and underwent repeat cardiac catheterization which demonstrated a significant transvalvular gradient across the aortic valve of moderate to severe aortic stenosis. She also had a proximal 60% stenosis of a diagonal branch of the LAD--the major vessels were without significant disease.        The patient has been on hemodialysis since her liver transplantation in 2011 for chronic hepatitis C. The patient is not produce any urine. The patient is at significant vascular access procedures and issues and currently has a functioning Gore-Tex loop in the left thigh.   Current Activity/ Functional Status: The patient lives alone and drives her car to hemo- dialysis   Zubrod Score: At the time of surgery this patient's most appropriate activity status/level should be described as: []     0    Normal activity, no symptoms []     1    Restricted in physical strenuous activity but ambulatory, able to do out light work [x]     2    Ambulatory and capable of self care, unable to do work activities, up and about                 more than 50%  Of the time                            []     3    Only limited self care, in bed  greater than 50% of waking hours []     4    Completely disabled, no self care, confined to bed or chair []     5    Moribund  Past Medical History  Diagnosis Date  . 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 Chronic back pain   . CAD (coronary artery disease)   . Obesity   . Peripheral vascular disease hands and legs  . Anxiety   . Asthma   . GERD (gastroesophageal reflux disease)     takes Omeprazole daily  . Chronic constipation     takes MIralax and Colace daily  . Anemia     takes Folic Acid daily  . Hypothyroidism     takes Synthroid daily  . Depression     takes Cymbalta for "severe" depression  . Neuromuscular disorder     carpal tunnel in right hand  . Hypertension     takes Metoprolol and Lisinopril daily, sees Dr Bea Graff  . COPD (chronic obstructive pulmonary disease)   . Pneumonia     "today and several times  before" (08/30/2012)  . Chronic bronchitis     "q yr w/season changes" (08/30/2012)  . Type II diabetes mellitus     Levemir 2units daily if > 150  . History of blood transfusion     "several" (08/30/2012)  . Hepatitis C   . Migraine     "last migraine was in 2013" (08/30/2012)  . Headache     "at least monthly" (08/30/2012)  . Arthritis     "left hand, back" (08/30/2012)  . End stage renal disease on dialysis 02/27/2011    Kidneys shut down at time of liver transplant in Sept 2011 at [facility] [place], she has been on HD ever since.  Dialyzes at [facility] HD on TTS schedule.  Had L forearm graft used 10 months then removed Dec 2012 due to suspected infection.  A right upper arm AVG was placed Dec 2012 but she developed steal symptoms acutely and it was ligated the same day.  Never had an AV fistula due to small veins.  Now has L thigh AVG put in Jan 2013, has not clotted to date.      Past Surgical History  Procedure Laterality Date  . Liver transplant  10/25/2009    sees Dr Ferol Luz 1  every 6 months, saw last in Dec 2013. [name] Coord [phone]  . Small intestine surgery  90's  . Thrombectomy    . Arteriovenous graft placement Left 10/03/10     forearm  . Avgg removal  12/23/2010    Procedure: REMOVAL OF ARTERIOVENOUS GORETEX GRAFT ([place]);  Surgeon: Elam Dutch, MD;  Location: [place];  Service: Vascular;  Laterality: Left;  procedure started @1736 -1852  . Insertion of dialysis catheter  12/23/2010    Procedure: INSERTION OF DIALYSIS CATHETER;  Surgeon: Elam Dutch, MD;  Location: [place];  Service: Vascular;  Laterality: Right;  Right Internal Jugular 28cm dialysis catheter insertion procedure time 1701-1720   . Cholecystectomy  1993  . Cystoscopy  1990's  . Spinal growth rods  2010    "put 2 metal rods in my back; they had detetriorated" (08/30/2012)  . Av fistula placement  01/29/2011    Procedure: INSERTION OF ARTERIOVENOUS (AV) GORE-TEX GRAFT ARM;  Surgeon: Elam Dutch, MD;  Location: [place];  Service: Vascular;  Laterality: Right;  . Av fistula placement  03/10/2011    Procedure: INSERTION OF ARTERIOVENOUS (AV) GORE-TEX GRAFT THIGH;  Surgeon: Elam Dutch, MD;  Location: [place];  Service: Vascular;  Laterality: Left;  . Tubal ligation  1990's  . Cardiac catheterization  2014    History  Smoking status  . Current Every Day Smoker -- 0.75 packs/day for 40 years  . Types: Cigarettes  Smokeless tobacco  . Never Used    History  Alcohol Use  . Yes    Comment: 08/30/2012 "last drink was at a wedding July, 2014; had a small glass of wine; never had problems w/alcohol"    History   Social History  . Marital Status: Divorced    Spouse Name: N/A    Number of Children: N/A  . Years of Education: N/A   Occupational History  . Not on file.   Social History Main Topics  . Smoking status: Current Every Day Smoker -- 0.75 packs/day for 40 years    Types: Cigarettes  . Smokeless tobacco: Never Used  . Alcohol Use: Yes     Comment:  08/30/2012 "last drink was at a wedding July, 2014; had a  small glass of wine; never had problems w/alcohol"  . Drug Use: No  . Sexual Activity: Not Currently   Other Topics Concern  . Not on file   Social History Narrative  . No narrative on file    Allergies  Allergen Reactions  . Acetaminophen Other (See Comments)    Liver transplant recipient   . Codeine Itching    Current Facility-Administered Medications  Medication Dose Route Frequency Provider Last Rate Last Dose  . 0.9 %  sodium chloride infusion  100 mL Intravenous PRN Sol Blazing, MD      . 0.9 %  sodium chloride infusion  100 mL Intravenous PRN Sol Blazing, MD      . acetaminophen (TYLENOL) tablet 650 mg  650 mg Oral Q4H PRN Troy Sine, MD      . albuterol (PROVENTIL) (2.5 MG/3ML) 0.083% nebulizer solution 3 mL  3 mL Inhalation Q6H PRN Erlene Quan, PA-C      . aspirin EC tablet 81 mg  81 mg Oral Daily Luke K Kilroy, PA-C   81 mg at 03/11/13 1022  . budesonide-formoterol (SYMBICORT) 160-4.5 MCG/ACT inhaler 2 puff  2 puff Inhalation BID Ivin Poot, MD   2 puff at 03/11/13 [phone]  . calcium carbonate (TUMS - dosed in mg elemental calcium) chewable tablet 200 mg of elemental calcium  1 tablet Oral Daily Doreene Burke Kilroy, PA-C   200 mg of elemental calcium at 03/11/13 1023  . cinacalcet (SENSIPAR) tablet 30 mg  30 mg Oral Q breakfast Thayer Headings, MD   30 mg at 03/11/13 1024  . docusate sodium (COLACE) capsule 100 mg  100 mg Oral Daily Erlene Quan, PA-C   100 mg at 03/11/13 1022  . DULoxetine (CYMBALTA) DR capsule 60 mg  60 mg Oral QHS Erlene Quan, PA-C   60 mg at 03/10/13 2143  . enoxaparin (LOVENOX) injection 30 mg  30 mg Subcutaneous Q24H Carlena Bjornstad, MD   30 mg at 03/10/13 1739  . feeding supplement (NEPRO CARB STEADY) liquid 237 mL  237 mL Oral PRN Sol Blazing, MD      . gabapentin (NEURONTIN) capsule 300 mg  300 mg Oral QHS Doreene Burke [place], PA-C   300 mg at 03/10/13 2143  . heparin injection  1,000 Units  1,000 Units Dialysis PRN Sol Blazing, MD      . insulin aspart (novoLOG) injection 0-15 Units  0-15 Units Subcutaneous TID [facility] Erlene Quan, PA-C   5 Units at 03/10/13 1739  . insulin aspart (novoLOG) injection 0-5 Units  0-5 Units Subcutaneous QHS Luke K Kilroy, PA-C      . levothyroxine (SYNTHROID, LEVOTHROID) tablet 150 mcg  150 mcg Oral QAC breakfast Erlene Quan, PA-C   150 mcg at 03/11/13 [id number]  . lidocaine (PF) (XYLOCAINE) 1 % injection 5 mL  5 mL Intradermal PRN Sol Blazing, MD      . lidocaine-prilocaine (EMLA) cream 1 application  1 application Topical PRN Sol Blazing, MD      . LORazepam (ATIVAN) tablet 0.5 mg  0.5 mg Oral QHS Erlene Quan, PA-C   0.5 mg at 03/10/13 2143  . metoprolol tartrate (LOPRESSOR) tablet 25 mg  25 mg Oral BID Doreene Burke Kilroy, PA-C   25 mg at 03/11/13 1024  . multivitamin with minerals tablet 1 tablet  1 tablet Oral Daily Erlene Quan, PA-C   1 tablet at 03/11/13 1024  .  nicotine (NICODERM CQ - dosed in mg/24 hr) patch 7 mg  7 mg Transdermal Daily Doreene Burke Kilroy, PA-C   7 mg at 03/11/13 1025  . nitroGLYCERIN (NITROSTAT) SL tablet 0.4 mg  0.4 mg Sublingual Q5 min PRN Erlene Quan, PA-C      . ondansetron [facility]) injection 4 mg  4 mg Intravenous Q6H PRN Troy Sine, MD      . pantoprazole (PROTONIX) EC tablet 40 mg  40 mg Oral Daily Luke K Kilroy, PA-C   40 mg at 03/11/13 1030  . pentafluoroprop-tetrafluoroeth (GEBAUERS) aerosol 1 application  1 application Topical PRN Sol Blazing, MD      . promethazine (PHENERGAN) tablet 12.5 mg  12.5 mg Oral Q4H PRN Doreene Burke Kilroy, PA-C      . senna (SENOKOT) tablet 8.6 mg  1 tablet Oral QPM Doreene Burke Kilroy, PA-C   8.6 mg at 03/10/13 1739  . tacrolimus (PROGRAF) capsule 5 mg  5 mg Oral BID Erlene Quan, PA-C   5 mg at 03/11/13 1023  . traMADol (ULTRAM) tablet 50 mg  50 mg Oral BID PRN Erlene Quan, PA-C   50 mg at 03/10/13 1478    Prescriptions prior to admission  Medication Sig Dispense  Refill  . albuterol (PROVENTIL,VENTOLIN) 90 MCG/ACT inhaler Inhale 1-2 puffs into the lungs every 6 (six) hours as needed for wheezing or shortness of breath.       [name] aspirin EC 81 MG tablet Take 81 mg by mouth daily.        . calcium carbonate (TUMS - DOSED IN MG ELEMENTAL CALCIUM) 500 MG chewable tablet Chew 1 tablet by mouth daily.      . cinacalcet (SENSIPAR) 30 MG tablet Take 30 mg by mouth daily.      [name] docusate sodium (COLACE) 100 MG capsule Take 100 mg by mouth daily.       . DULoxetine (CYMBALTA) 60 MG capsule Take 60 mg by mouth at bedtime.       . gabapentin (NEURONTIN) 300 MG capsule Take 300 mg by mouth at bedtime.       . insulin detemir (LEVEMIR) 100 UNIT/ML injection Inject 2-3 Units into the skin at bedtime as needed (high blood sugar). Uses only if blood sugar levels are over 150      . levothyroxine (SYNTHROID, LEVOTHROID) 150 MCG tablet Take 150 mcg by mouth daily before breakfast.      . LORazepam (ATIVAN) 0.5 MG tablet Take 0.5 mg by mouth at bedtime.       . metoprolol tartrate (LOPRESSOR) 25 MG tablet Take 1 tablet (25 mg total) by mouth 2 (two) times daily.  60 tablet  1  . Multiple Vitamin (MULTIVITAMIN WITH MINERALS) TABS tablet Take 1 tablet by mouth daily.      . nicotine (NICODERM CQ - DOSED IN MG/24 HR) 7 mg/24hr patch Place 7 mg onto the skin daily.      . nitroGLYCERIN (NITROSTAT) 0.4 MG SL tablet Place 0.4 mg under the tongue every 5 (five) minutes as needed for chest pain.      [name] omeprazole (PRILOSEC) 20 MG capsule Take 20 mg by mouth daily.      . promethazine (PHENERGAN) 12.5 MG tablet Take 12.5 mg by mouth every 4 (four) hours as needed for nausea.       [name] senna (SENOKOT) 8.6 MG TABS tablet Take 1 tablet by mouth every evening.      . tacrolimus (PROGRAF)  1 MG capsule Take 5 mg by mouth 2 (two) times daily.      . traMADol (ULTRAM) 50 MG tablet Take 50 mg by mouth 2 (two) times daily as needed (pain).         Family History  Problem Relation Age of Onset    . Cancer Mother   . Diabetes Mother   . Hypertension Mother   . Stroke Mother   . Cancer Father   . Anesthesia problems Neg Hx   . Hypotension Neg Hx   . Malignant hyperthermia Neg Hx   . Pseudochol deficiency Neg Hx      Review of Systems:     Cardiac Review of Systems: Y or N  Chest Pain [ y   ]  Resting SOB [  y ] Exertional SOB  [ y ]  Orthopnea [  y]   Pedal Edema Blue.Reese   ]    Palpitations [ n ] Syncope  [ n ]   Presyncope [ y  ]  General Review of Systems: [Y] = yes [  ]=no Constitional: recent weight change [nn  ]; anorexia [  ]; fatigue [  ]; nausea [  ]; night sweats [  ]; fever [  ]; or chills [  ]                                                               Dental: poor dentition[  ]; Last Dentist visit: 1 year  Eye : blurred vision [  ]; diplopia [   ]; vision changes [  ];  Amaurosis fugax[  ]; Resp: cough [  ];  wheezing[  ];  hemoptysis[  ]; shortness of breath[ y ]; paroxysmal nocturnal dyspnea[  ]; dyspnea on exertion[y  ]; or orthopnea[ y ];  GI:  gallstones[  ], vomiting[  ];  dysphagia[  ]; melena[  ];  hematochezia [  ]; heartburn[  ];   Hx of  Colonoscopy[  ]; GU: kidney stones [  ]; hematuria[  ];   dysuria [  ];  nocturia[  ];  history of     obstruction [  ]; urinary frequency [  ]             Skin: rash, swelling[  ];, hair loss[  ];  peripheral edema[  ];  or itching[  ]; Musculosketetal: myalgias[  ];  joint swelling[  ];  joint erythema[  ];  joint pain[  ];  back pain[  ];  Heme/Lymph: bruising[  ];  bleeding[  ];  anemia[  ];  Neuro: TIA[  ];  headaches[  ];  stroke[  ];  vertigo[  ];  seizures[  ];   paresthesias[  ];  difficulty walking[  ];  Psych:depression[  ]; anxiety[  ];  Endocrine: diabetes[y  ];  thyroid dysfunction[y  ];  Immunizations: Flu [  ]; Pneumococcal[  ];  Other:Right hand dominanat  Physical Exam: BP 153/72  Pulse 71  Temp(Src) 98.1 F (36.7 C) (Oral)  Resp 20  Ht 5\' 4"  (1.626 m)  Wt 167 lb 11.2 oz (76.068 kg)  BMI 28.77  kg/m2  SpO2 99%  EXAM General appearance this middle-aged AfricanAmerican female accompanied by her daughters in no acute distress HEENT  normocephalic pupils equal dentition good with total plate of maxilla Neck-no bruit JVD or mass Thorax-scattered rhonchi, no deformity or tendernes COR-regular rhythm 3/6 systolic ejection murmur   abdomen-obese soft nontender, surgical scar from liver transplant Extremities-multiple surgical incisions from vascular access procedures upper arms, Gore-Tex loop and left thigh poor peripheral pulses in all extremities, mild pedal edema Neuro -alert responsive nonfocal, no motor deficit  Diagnostic Studies & Laboratory data:     Recent Radiology Findings:   chest x-ray shows mild interstitial edema consistent with CHF    Recent Lab Findings: Lab Results  Component Value Date   WBC 5.9 03/09/2013   HGB 12.4 03/09/2013   HCT 37.2 03/09/2013   PLT 139* 03/09/2013   GLUCOSE 142* 03/10/2013   CHOL 225* 12/22/2011   TRIG 121 12/22/2011   HDL 97 12/22/2011   LDLCALC 104* 12/22/2011   ALT 18 03/08/2013   AST 22 03/08/2013   NA 137 03/10/2013   K 5.2 03/10/2013   CL 96 03/10/2013   CREATININE 5.84* 03/10/2013   BUN 24* 03/10/2013   CO2 28 03/10/2013   TSH 1.182 03/08/2013   INR 0.92 03/08/2013   HGBA1C 7.5* 02/22/2013      Assessment / Plan:     Patient with severe aortic stenosis, symptomatic with symptoms of CHF chest pain and recent problems with hypotension and tachycardia during hemodialysis. Her best therapy would be aVR ago she would be at increased risk due to her comorbid medical problems including renal failure on hemodialysis, COPD from previous and current smoking, and issues with vascular access.  However aVR with mechanical valve would give her long-term benefit to improve survival despite the increase in operative risk. Discussed the procedure briefly the patient and her family and we will initiate preoperative evaluation tentatively schedule  Surgiport early next week. Would recommend hemodialysis on Saturday and Monday and surgery scheduled for next Tuesday       @ME1 @ 03/11/2013 11:41 AM

## 2013-03-11 NOTE — Progress Notes (Signed)
Subjective:  No complaints, no dyspnea or chest pain  Objective: Vital signs in last 24 hours: Temp:  [97.4 F (36.3 C)-98.2 F (36.8 C)] 98.1 F (36.7 C) (01/30 0536) Pulse Rate:  [64-72] 71 (01/30 0536) Resp:  [20] 20 (01/29 1325) BP: (107-171)/(47-85) 153/72 mmHg (01/30 0536) SpO2:  [99 %-100 %] 99 % (01/30 0823) Weight:  [76.068 kg (167 lb 11.2 oz)] 76.068 kg (167 lb 11.2 oz) (01/30 0536) Weight change: -1.032 kg (-2 lb 4.4 oz)  Intake/Output from previous day: 01/29 0701 - 01/30 0700 In: 822 [P.O.:822] Out: -  Intake/Output this shift: Total I/O In: 360 [P.O.:360] Out: -   Lab Results:  Recent Labs  03/08/13 1823 03/09/13 1245  WBC 6.9 5.9  HGB 10.6* 12.4  HCT 31.4* 37.2  PLT 127* 139*   BMET:   Recent Labs  03/08/13 1823 03/09/13 1245 03/10/13 1055  NA 142  --  137  K 3.8  --  5.2  CL 98  --  96  CO2 29  --  28  GLUCOSE 126*  --  142*  BUN 20  --  24*  CREATININE 5.34* 7.58* 5.84*  CALCIUM 8.6  --  8.5  ALBUMIN 2.9*  --   --    No results found for this basename: PTH,  in the last 72 hours Iron Studies: No results found for this basename: IRON, TIBC, TRANSFERRIN, FERRITIN,  in the last 72 hours  EXAM: Alert, in no apparent distress Chest is clear bilat RRR 2/6 SEM no rub or gallop GI:  + BS, soft and nontender No LE edema Access:   L thigh AVG with + bruit  Dialysis: TTS [place]  4h 74kg 2K/2.25 [place] L thigh AVG No heparin  Hect 4 EPO 1000 Venofer 50 mg/wk  Assessment 1. Severe aortic stenosis / CAD, for AVR/CABG next week 2. ESRD 3. HTN/vol, at dry wt, on MTP 4. Anemia - Hgb 12.4, stable. 5. 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  6. DM - insulin per primary. 7. Liver transplant - on Prograf  Plan- HD tomorrow  [name] MD (pgr) [phone]    ([phone] 03/11/2013, 10:20 AM

## 2013-03-11 NOTE — Progress Notes (Signed)
Pt requesting to go off unit to 1st floor/gift shop area.  Rosaria Ferries, PA notified.  Decided that pt may only go as far as solarium on 3rd floor.  Pt notified and agrees to this.  Family at bedside.

## 2013-03-11 NOTE — Progress Notes (Signed)
At about 2150 pt HR increased to 120s and was junctional tachycardia.  Pt lying in bed asleep at the time. When woken, pt complained of palpitations and dizziness but denied chest pain.  MD on call notified.  No new orders at this time.  Pt HR currently back to NSR in the 60s.  Will continue to monitor.

## 2013-03-12 DIAGNOSIS — E785 Hyperlipidemia, unspecified: Secondary | ICD-10-CM

## 2013-03-12 DIAGNOSIS — N186 End stage renal disease: Secondary | ICD-10-CM

## 2013-03-12 DIAGNOSIS — I5042 Chronic combined systolic (congestive) and diastolic (congestive) heart failure: Secondary | ICD-10-CM

## 2013-03-12 DIAGNOSIS — I509 Heart failure, unspecified: Secondary | ICD-10-CM

## 2013-03-12 DIAGNOSIS — J449 Chronic obstructive pulmonary disease, unspecified: Secondary | ICD-10-CM

## 2013-03-12 LAB — GLUCOSE, CAPILLARY
GLUCOSE-CAPILLARY: 220 mg/dL — AB (ref 70–99)
Glucose-Capillary: 81 mg/dL (ref 70–99)

## 2013-03-12 LAB — BASIC METABOLIC PANEL
BUN: 49 mg/dL — ABNORMAL HIGH (ref 6–23)
CO2: 22 meq/L (ref 19–32)
Calcium: 8.4 mg/dL (ref 8.4–10.5)
Chloride: 94 mEq/L — ABNORMAL LOW (ref 96–112)
Creatinine, Ser: 9.81 mg/dL — ABNORMAL HIGH (ref 0.50–1.10)
GFR calc Af Amer: 4 mL/min — ABNORMAL LOW (ref >90)
GFR calc non Af Amer: 4 mL/min — ABNORMAL LOW (ref >90)
GLUCOSE: 191 mg/dL — AB (ref 70–99)
POTASSIUM: 4.9 meq/L (ref 3.7–5.3)
SODIUM: 134 meq/L — AB (ref 137–147)

## 2013-03-12 MED ORDER — NEPRO/CARBSTEADY PO LIQD
237.0000 mL | ORAL | Status: DC | PRN
  Filled 2013-03-12: qty 237

## 2013-03-12 MED ORDER — ALTEPLASE 2 MG IJ SOLR
2.0000 mg | Freq: Once | INTRAMUSCULAR | Status: DC | PRN
  Filled 2013-03-12 (×3): qty 2

## 2013-03-12 MED ORDER — SODIUM CHLORIDE 0.9 % IV SOLN: 100.0000 mL | INTRAVENOUS | Status: DC | PRN

## 2013-03-12 MED ORDER — LIDOCAINE HCL (PF) 1 % IJ SOLN: 5.0000 mL | INTRAMUSCULAR | Status: DC | PRN

## 2013-03-12 MED ORDER — PENTAFLUOROPROP-TETRAFLUOROETH EX AERO: 1.0000 "application " | INHALATION_SPRAY | CUTANEOUS | Status: DC | PRN

## 2013-03-12 MED ORDER — HEPARIN SODIUM (PORCINE) 1000 UNIT/ML DIALYSIS: 1000.0000 [IU] | INTRAMUSCULAR | Status: DC | PRN

## 2013-03-12 MED ORDER — DOXERCALCIFEROL 4 MCG/2ML IV SOLN
4.0000 ug | INTRAVENOUS | Status: DC
  Administered 2013-03-19 – 2013-03-24 (×3): 4 ug via INTRAVENOUS
  Filled 2013-03-12 (×9): qty 2

## 2013-03-12 MED ORDER — LIDOCAINE-PRILOCAINE 2.5-2.5 % EX CREA
1.0000 "application " | TOPICAL_CREAM | CUTANEOUS | Status: DC | PRN
  Filled 2013-03-12: qty 5

## 2013-03-12 MED ORDER — DOXERCALCIFEROL 4 MCG/2ML IV SOLN
INTRAVENOUS | Status: AC
  Administered 2013-03-12: 4 ug
  Filled 2013-03-12: qty 2

## 2013-03-12 MED ORDER — ALTEPLASE 2 MG IJ SOLR
2.0000 mg | Freq: Once | INTRAMUSCULAR | Status: DC | PRN
  Filled 2013-03-12: qty 2

## 2013-03-12 NOTE — Progress Notes (Signed)
[facility] Progress Note  Subjective:   Ready to have the surgery because of recurrent episodes of chest pressure/pain  Objective Filed Vitals:   03/12/13 0247 03/12/13 0520 03/12/13 0713 03/12/13 0733  BP: 147/70 127/60 152/101 156/83  Pulse: 71 69 110 68  Temp:  97.7 F (36.5 C) 97.9 F (36.6 C)   TempSrc:  Oral Oral   Resp:  18 19 18   Height:      Weight:   77.4 kg (170 lb 10.2 oz)   SpO2: 100% 100% 100%    Physical Exam goal 3.5 General: NAD on room air Heart: RRR 3/6 murmur Lungs: no wheezes or rales Abdomen: soft NT Extremities: no LE edema Dialysis Access: left thigh AVGG Qb 400  Dialysis: TTS [place]  4h 74kg 2K/2.25 Bath L thigh AVG No heparin  Hect 4 EPO 1000 Venofer 50 mg/wk   Assessment  1. Severe aortic stenosis / CAD, for AVR/CABG next week; transient jxn rhythm last night with some chest pressure changed back to sinus 2. ESRD TTS - labs pending; plan next HD Monday due to Tuesday surgery 3. HTN/vol, at dry wt, on metoprolol 25 bid  4. Anemia - Hgb 12.4 1/28  No ESA at present 5. 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 DM - insulin per primary. 7. Liver transplant - on Prograf 8. Tobacco abuse  Myriam Jacobson, PA-C [facility] Beeper [phone] 03/12/2013,7:50 AM  LOS: 4 days   I have seen and examined patient, discussed with PA and agree with assessment and plan as outlined above. [name] MD pager [phone]    cell [phone] 03/12/2013, 12:38 PM    Additional Objective Labs: Basic Metabolic Panel:  Recent Labs Lab 03/08/13 1823 03/09/13 1245 03/10/13 1055  NA 142  --  137  K 3.8  --  5.2  CL 98  --  96  CO2 29  --  28  GLUCOSE 126*  --  142*  BUN 20  --  24*  CREATININE 5.34* 7.58* 5.84*  CALCIUM 8.6  --  8.5   Liver Function Tests:  Recent Labs Lab 03/08/13 1823  AST 22  ALT 18  ALKPHOS 68  BILITOT 0.3  PROT 6.9  ALBUMIN 2.9*   CBC:  Recent Labs Lab  03/08/13 1823 03/09/13 1245  WBC 6.9 5.9  HGB 10.6* 12.4  HCT 31.4* 37.2  MCV 96.0 97.4  PLT 127* 139*   Blood Culture    Component Value Date/Time   SDES BLOOD LEFT HAND 08/30/2012 0800   SPECREQUEST BOTTLES DRAWN AEROBIC ONLY 4CC 08/30/2012 0800   CULT NO GROWTH 5 DAYS 08/30/2012 0800   REPTSTATUS 09/05/2012 FINAL 08/30/2012 0800    Cardiac Enzymes:  Recent Labs Lab 03/08/13 1823 03/09/13 0450  TROPONINI <0.30 <0.30   CBG:  Recent Labs Lab 03/10/13 2030 03/11/13 0807 03/11/13 1218 03/11/13 1637 03/11/13 2112  GLUCAP 153* 156* 216* 134* 150*  Studies/Results: Dg Orthopantogram  03/11/2013   CLINICAL DATA:  Patient for aortic valve replacement.  EXAM: ORTHOPANTOGRAM/PANORAMIC  COMPARISON:  None.  FINDINGS: The patient is missing multiple teeth. No evidence of periapical abscess is identified. No focal bony lesion is seen.  IMPRESSION: No acute finding.   Electronically Signed   By: Inge Rise M.D.   On: 03/11/2013 13:24   Ct Chest Wo Contrast  03/11/2013   CLINICAL DATA:  58 -year-old female preoperative study for aortic valve replacement. Recent  chest pain and weakness.  EXAM: CT CHEST WITHOUT CONTRAST  TECHNIQUE: Multi detector CT of the chest without contrast. Axial sagittal and coronal multiplanar reformatted images.  CONTRAST:  None.  COMPARISON:  Chest radiographs 03/08/2013 and earlier. Abdomen CT 01/29/2012 and earlier.  RADIOPHARMACEUTICALS:  None.  FLUOROSCOPY TIME:  None.  FINDINGS: Major airways are patent. Improved ventilation of both lung bases since the 2013 comparison. No pericardial or pleural effusion. No pulmonary consolidation.  No acute osseous abnormality.  Stable visualized upper abdominal viscera, numerous surgical clips about the liver compatible with prior liver transplant. No upper abdominal free fluid.  Calcified coronary artery atherosclerosis. Calcifications at the aortic valve plane. No mediastinal lymphadenopathy. Negative non  contrast thoracic inlet. No axillary lymphadenopathy.  IMPRESSION: 1. No acute findings in the chest. 2. Calcified coronary artery atherosclerosis. Aortic valve calcifications. 3. Sequelae of liver transplant.   Electronically Signed   By: Lars Pinks M.D.   On: 03/11/2013 11:57   Medications:   . aspirin EC  81 mg Oral Daily  . budesonide-formoterol  2 puff Inhalation BID  . calcium carbonate  1 tablet Oral Daily  . cinacalcet  30 mg Oral Q breakfast  . docusate sodium  100 mg Oral Daily  . DULoxetine  60 mg Oral QHS  . enoxaparin (LOVENOX) injection  30 mg Subcutaneous Q24H  . gabapentin  300 mg Oral QHS  . insulin aspart  0-15 Units Subcutaneous TID WC  . insulin aspart  0-5 Units Subcutaneous QHS  . levothyroxine  150 mcg Oral QAC breakfast  . LORazepam  0.5 mg Oral QHS  . metoprolol tartrate  25 mg Oral BID  . multivitamin with minerals  1 tablet Oral Daily  . nicotine  7 mg Transdermal Daily  . pantoprazole  40 mg Oral Daily  . senna  1 tablet Oral QPM  . tacrolimus  5 mg Oral BID

## 2013-03-12 NOTE — Progress Notes (Signed)
AT about 0218, pt HR increased to 117 and was a junctional rhythm.  Pt awoke at [place] with 10/10 chest pressure.  An ECG was done (pt in accelerated junctional rhythm), vitals obtained and MD notified.  Pt heart rate decreased to 80s, sinus rhythm and pt reported pressure easing off.  Sublingual nitro given and pt reported complete relief of her pain.  A second ECG was done once pt had returned to sinus rhythm to verify that there were no changes.  No new orders from MD.  Pt currently denying pain and reporting increased fatigue/drowsiness.  Will continue to monitor.

## 2013-03-12 NOTE — Progress Notes (Signed)
Primary cardiologist: Dr. Glenetta Hew (admitting)  Subjective:    In HD. Had some chest pain last night. No palpitations.  Objective:   Temp:  [97.5 F (36.4 C)-97.9 F (36.6 C)] 97.9 F (36.6 C) (01/31 0713) Pulse Rate:  [66-117] 77 (01/31 0930) Resp:  [15-20] 15 (01/31 0930) BP: (94-184)/(58-101) 108/76 mmHg (01/31 0930) SpO2:  [98 %-100 %] 100 % (01/31 0713) Weight:  [170 lb 10.2 oz (77.4 kg)] 170 lb 10.2 oz (77.4 kg) (01/31 0713) Last BM Date: 03/09/13  Filed Weights   03/10/13 0645 03/11/13 0536 03/12/13 0713  Weight: 164 lb 3.9 oz (74.5 kg) 167 lb 11.2 oz (76.068 kg) 170 lb 10.2 oz (77.4 kg)    Intake/Output Summary (Last 24 hours) at 03/12/13 1022 Last data filed at 03/12/13 0510  Gross per 24 hour  Intake    300 ml  Output      0 ml  Net    300 ml    Telemetry: Sinus, some junctional.  Exam:  General: No distress, supine.  Lungs: Clear, nonlabored.  Cardiac: RRR, 2/6 systolic murmur, no gallop.  Abdomen: NABS.  Extremities: No pitting.   Lab Results:  Basic Metabolic Panel:  Recent Labs Lab 03/08/13 1823 03/09/13 1245 03/10/13 1055 03/12/13 0735  NA 142  --  137 134*  K 3.8  --  5.2 4.9  CL 98  --  96 94*  CO2 29  --  28 22  GLUCOSE 126*  --  142* 191*  BUN 20  --  24* 49*  CREATININE 5.34* 7.58* 5.84* 9.81*  CALCIUM 8.6  --  8.5 8.4    Liver Function Tests:  Recent Labs Lab 03/08/13 1823  AST 22  ALT 18  ALKPHOS 68  BILITOT 0.3  PROT 6.9  ALBUMIN 2.9*    CBC:  Recent Labs Lab 03/08/13 1823 03/09/13 1245  WBC 6.9 5.9  HGB 10.6* 12.4  HCT 31.4* 37.2  MCV 96.0 97.4  PLT 127* 139*    Cardiac Enzymes:  Recent Labs Lab 03/08/13 1823 03/09/13 0450  TROPONINI <0.30 <0.30     Medications:   Scheduled Medications: . aspirin EC  81 mg Oral Daily  . budesonide-formoterol  2 puff Inhalation BID  . calcium carbonate  1 tablet Oral Daily  . cinacalcet  30 mg Oral Q breakfast  . docusate sodium  100 mg Oral  Daily  . doxercalciferol  4 mcg Intravenous Q T,Th,Sa-HD  . DULoxetine  60 mg Oral QHS  . enoxaparin (LOVENOX) injection  30 mg Subcutaneous Q24H  . gabapentin  300 mg Oral QHS  . insulin aspart  0-15 Units Subcutaneous TID WC  . insulin aspart  0-5 Units Subcutaneous QHS  . levothyroxine  150 mcg Oral QAC breakfast  . LORazepam  0.5 mg Oral QHS  . metoprolol tartrate  25 mg Oral BID  . multivitamin with minerals  1 tablet Oral Daily  . nicotine  7 mg Transdermal Daily  . pantoprazole  40 mg Oral Daily  . senna  1 tablet Oral QPM  . tacrolimus  5 mg Oral BID      PRN Medications:  sodium chloride, sodium chloride, sodium chloride, sodium chloride, sodium chloride, sodium chloride, acetaminophen, albuterol, alteplase, alteplase, feeding supplement (NEPRO CARB STEADY), feeding supplement (NEPRO CARB STEADY), feeding supplement (NEPRO CARB STEADY), heparin, heparin, heparin, lidocaine (PF), lidocaine (PF), lidocaine (PF), lidocaine-prilocaine, lidocaine-prilocaine, lidocaine-prilocaine, nitroGLYCERIN, ondansetron (ZOFRAN) IV  pentafluoroprop-tetrafluoroeth, pentafluoroprop-tetrafluoroeth, pentafluoroprop-tetrafluoroeth, promethazine, traMADol   Assessment:  1. Severe aortic stenosis.   2. 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  3. ESRD on HD.  4. Tobacco abuse.  5. COPD, PFTs pending.  6. LDL 104, not on statin with history of liver transplant.  7. History of liver transplant 2011, on tacrolimus.   Plan/Discussion:    For AVR next week per TCTS - notes reviewed. Continue ASA and lopressor. Followup CBC and LFTs. Keep on telemetry.   Satira Sark, M.D., F.A.C.C.

## 2013-03-13 LAB — CBC
HEMATOCRIT: 29.2 % — AB (ref 36.0–46.0)
HEMOGLOBIN: 10.1 g/dL — AB (ref 12.0–15.0)
MCH: 33 pg (ref 26.0–34.0)
MCHC: 34.6 g/dL (ref 30.0–36.0)
MCV: 95.4 fL (ref 78.0–100.0)
Platelets: 95 10*3/uL — ABNORMAL LOW (ref 150–400)
RBC: 3.06 MIL/uL — ABNORMAL LOW (ref 3.87–5.11)
RDW: 14.1 % (ref 11.5–15.5)
WBC: 3.9 10*3/uL — AB (ref 4.0–10.5)

## 2013-03-13 LAB — COMPREHENSIVE METABOLIC PANEL
ALK PHOS: 66 U/L (ref 39–117)
ALT: 18 U/L (ref 0–35)
AST: 26 U/L (ref 0–37)
Albumin: 2.7 g/dL — ABNORMAL LOW (ref 3.5–5.2)
BILIRUBIN TOTAL: 0.3 mg/dL (ref 0.3–1.2)
BUN: 28 mg/dL — AB (ref 6–23)
CHLORIDE: 93 meq/L — AB (ref 96–112)
CO2: 28 mEq/L (ref 19–32)
CREATININE: 6.58 mg/dL — AB (ref 0.50–1.10)
Calcium: 8.4 mg/dL (ref 8.4–10.5)
GFR calc non Af Amer: 6 mL/min — ABNORMAL LOW (ref >90)
GFR, EST AFRICAN AMERICAN: 7 mL/min — AB (ref >90)
Glucose, Bld: 179 mg/dL — ABNORMAL HIGH (ref 70–99)
POTASSIUM: 5 meq/L (ref 3.7–5.3)
Sodium: 136 mEq/L — ABNORMAL LOW (ref 137–147)
Total Protein: 6.5 g/dL (ref 6.0–8.3)

## 2013-03-13 LAB — GLUCOSE, CAPILLARY
Glucose-Capillary: 131 mg/dL — ABNORMAL HIGH (ref 70–99)
Glucose-Capillary: 147 mg/dL — ABNORMAL HIGH (ref 70–99)
Glucose-Capillary: 268 mg/dL — ABNORMAL HIGH (ref 70–99)
Glucose-Capillary: 135 mg/dL — ABNORMAL HIGH (ref 70–99)

## 2013-03-13 MED ORDER — ALTEPLASE 2 MG IJ SOLR: 2.0000 mg | Freq: Once | INTRAMUSCULAR | Status: AC | PRN

## 2013-03-13 MED ORDER — LIDOCAINE-PRILOCAINE 2.5-2.5 % EX CREA: 1.0000 "application " | TOPICAL_CREAM | CUTANEOUS | Status: DC | PRN

## 2013-03-13 MED ORDER — PENTAFLUOROPROP-TETRAFLUOROETH EX AERO: 1.0000 "application " | INHALATION_SPRAY | CUTANEOUS | Status: DC | PRN

## 2013-03-13 MED ORDER — SODIUM CHLORIDE 0.9 % IV SOLN: 100.0000 mL | INTRAVENOUS | Status: DC | PRN

## 2013-03-13 MED ORDER — LIDOCAINE HCL (PF) 1 % IJ SOLN: 5.0000 mL | INTRAMUSCULAR | Status: DC | PRN

## 2013-03-13 MED ORDER — NEPRO/CARBSTEADY PO LIQD: 237.0000 mL | ORAL | Status: DC | PRN

## 2013-03-13 MED ORDER — HEPARIN SODIUM (PORCINE) 1000 UNIT/ML DIALYSIS
1000.0000 [IU] | INTRAMUSCULAR | Status: DC | PRN
  Filled 2013-03-13: qty 1

## 2013-03-13 NOTE — Progress Notes (Signed)
  [facility] Progress Note   Subjective: Up in chair, no complaints  Filed Vitals:   03/12/13 1332 03/12/13 2100 03/13/13 0500 03/13/13 0946  BP: 155/83 131/59 134/68 156/70  Pulse: 75 74 75 79  Temp: 98 F (36.7 C) 98.4 F (36.9 C) 98.3 F (36.8 C)   TempSrc: Oral     Resp: 17 18 16    Height:      Weight:      SpO2: 100% 100% 99%   Exam NO distress, up in chair No jvd Chest clear bilat RRR 2/6 SEM, no rub or gallop Abd obese, soft, nt, nd No LE or UE edema Neuro is nf, ox3 Left thigh graft patent  Dialysis: TTS [place] 4h  74kg  2K/2.25 Bath  L thigh graft  No heparin  Assessment: 1 Aortic stenosis / chest pain- for AVR/CABG tuesday 2 ESRD on HD 3 HTN on MTP, at dry wt  4 Anemia, holding epo, Hb up 5 Liver transplant 6 DM on insulin   Plan- HD Monday instead of Tues, in anticipation of surgery this Tuesday   [name] MD pager [phone]    cell [phone] 03/13/2013, 11:56 AM   Recent Labs Lab 03/10/13 1055 03/12/13 0735 03/13/13 0544  NA 137 134* 136*  K 5.2 4.9 5.0  CL 96 94* 93*  CO2 28 22 28   GLUCOSE 142* 191* 179*  BUN 24* 49* 28*  CREATININE 5.84* 9.81* 6.58*  CALCIUM 8.5 8.4 8.4    Recent Labs Lab 03/08/13 1823 03/13/13 0544  AST 22 26  ALT 18 18  ALKPHOS 68 66  BILITOT 0.3 0.3  PROT 6.9 6.5  ALBUMIN 2.9* 2.7*    Recent Labs Lab 03/08/13 1823 03/09/13 1245 03/13/13 0544  WBC 6.9 5.9 3.9*  HGB 10.6* 12.4 10.1*  HCT 31.4* 37.2 29.2*  MCV 96.0 97.4 95.4  PLT 127* 139* 95*   . aspirin EC  81 mg Oral Daily  . budesonide-formoterol  2 puff Inhalation BID  . calcium carbonate  1 tablet Oral Daily  . cinacalcet  30 mg Oral Q breakfast  . docusate sodium  100 mg Oral Daily  . doxercalciferol  4 mcg Intravenous Q T,Th,Sa-HD  . DULoxetine  60 mg Oral QHS  . enoxaparin (LOVENOX) injection  30 mg Subcutaneous Q24H  . gabapentin  300 mg Oral QHS  . insulin aspart  0-15 Units Subcutaneous TID WC  . insulin  aspart  0-5 Units Subcutaneous QHS  . levothyroxine  150 mcg Oral QAC breakfast  . LORazepam  0.5 mg Oral QHS  . metoprolol tartrate  25 mg Oral BID  . multivitamin with minerals  1 tablet Oral Daily  . nicotine  7 mg Transdermal Daily  . pantoprazole  40 mg Oral Daily  . senna  1 tablet Oral QPM  . tacrolimus  5 mg Oral BID     sodium chloride, sodium chloride, acetaminophen, albuterol, feeding supplement (NEPRO CARB STEADY), heparin, lidocaine (PF), lidocaine-prilocaine, nitroGLYCERIN, ondansetron (ZOFRAN) IV, pentafluoroprop-tetrafluoroeth, promethazine, traMADol

## 2013-03-13 NOTE — Progress Notes (Signed)
Chaplain encountered patient walking alone into solarium this evening and observed pt's "fall risk" tag. Chaplain explained that the solarium was closed and asked if chaplain could walk pt back to her room. Pt said she was waiting to have surgery. She also said her RN said she could "walk as far as the solarium." Chaplain escorted pt back to her room at 3W17 and notified pt's RN. Pt was grateful to chaplain for walking her back and talking with her.   Ethelene Browns [phone]

## 2013-03-13 NOTE — Progress Notes (Signed)
Patient Name: [name] Date of Encounter: 03/13/2013     Principal Problem:   Chest pain- with tachycardia Active Problems:   HEPATITIS C- s/p liver transplant 2011   HYPOTHYROIDISM   DIABETES MELLITUS, TYPE II   ANEMIA, CHRONIC   HTN- LVH, EF45- 50%. grade 2 diastolic dysf. echo 6/96/78   GERD   End stage renal disease on dialysis-TTS   CAD- moderate with 50% D1, 20% LAD Jan. 2015   Aortic stenosis, moderate-severe at cath Jan 2015.    Dyslipidemia-LDL 104, not on statin with Hx of liver transplant   Chronic combined systolic and diastolic congestive heart failure   COPD (chronic obstructive pulmonary disease)   Tobacco abuse   Palpitations- run of tachycardia after HD, lasted 15-20 minutes    SUBJECTIVE The patient had mild chest discomfort yesterday relieved by tramadol.  No chest discomfort overnight or this morning.  No significant dyspnea.  Rhythm remains normal sinus rhythm.  CURRENT MEDS . aspirin EC  81 mg Oral Daily  . budesonide-formoterol  2 puff Inhalation BID  . calcium carbonate  1 tablet Oral Daily  . cinacalcet  30 mg Oral Q breakfast  . docusate sodium  100 mg Oral Daily  . doxercalciferol  4 mcg Intravenous Q T,Th,Sa-HD  . DULoxetine  60 mg Oral QHS  . enoxaparin (LOVENOX) injection  30 mg Subcutaneous Q24H  . gabapentin  300 mg Oral QHS  . insulin aspart  0-15 Units Subcutaneous TID WC  . insulin aspart  0-5 Units Subcutaneous QHS  . levothyroxine  150 mcg Oral QAC breakfast  . LORazepam  0.5 mg Oral QHS  . metoprolol tartrate  25 mg Oral BID  . multivitamin with minerals  1 tablet Oral Daily  . nicotine  7 mg Transdermal Daily  . pantoprazole  40 mg Oral Daily  . senna  1 tablet Oral QPM  . tacrolimus  5 mg Oral BID    OBJECTIVE  Filed Vitals:   03/12/13 1103 03/12/13 1332 03/12/13 2100 03/13/13 0500  BP: 145/70 155/83 131/59 134/68  Pulse: 73 75 74 75  Temp: 97.8 F (36.6 C) 98 F (36.7 C) 98.4 F (36.9 C) 98.3 F (36.8 C)    TempSrc: Oral Oral    Resp: 18 17 18 16   Height:      Weight: 166 lb 0.1 oz (75.3 kg)     SpO2: 98% 100% 100% 99%    Intake/Output Summary (Last 24 hours) at 03/13/13 0827 Last data filed at 03/12/13 2110  Gross per 24 hour  Intake    240 ml  Output   2100 ml  Net  -1860 ml   Filed Weights   03/11/13 0536 03/12/13 0713 03/12/13 1103  Weight: 167 lb 11.2 oz (76.068 kg) 170 lb 10.2 oz (77.4 kg) 166 lb 0.1 oz (75.3 kg)    PHYSICAL EXAM  General: Pleasant, NAD. Neuro: Alert and oriented X 3. Moves all extremities spontaneously. Psych: Normal affect. HEENT:  Normal  Neck: Supple without bruits or JVD. Lungs:  Resp regular and unlabored, CTA. Heart: RRR .  No gallop or rub.  Grade 3/6 harsh systolic ejection murmur heard widely across precordium and at base Abdomen: Soft, non-tender, non-distended, BS + x 4.  Extremities: No clubbing, cyanosis or edema. DP/PT/Radials 2+ and equal bilaterally.  Accessory Clinical Findings  CBC  Recent Labs  03/13/13 0544  WBC 3.9*  HGB 10.1*  HCT 29.2*  MCV 95.4  PLT 95*   Basic  Metabolic Panel  Recent Labs  03/12/13 0735 03/13/13 0544  NA 134* 136*  K 4.9 5.0  CL 94* 93*  CO2 22 28  GLUCOSE 191* 179*  BUN 49* 28*  CREATININE 9.81* 6.58*  CALCIUM 8.4 8.4   Liver Function Tests  Recent Labs  03/13/13 0544  AST 26  ALT 18  ALKPHOS 66  BILITOT 0.3  PROT 6.5  ALBUMIN 2.7*   No results found for this basename: LIPASE, AMYLASE,  in the last 72 hours Cardiac Enzymes No results found for this basename: CKTOTAL, CKMB, CKMBINDEX, TROPONINI,  in the last 72 hours BNP No components found with this basename: POCBNP,  D-Dimer No results found for this basename: DDIMER,  in the last 72 hours Hemoglobin A1C  Recent Labs  03/11/13 0430  HGBA1C 8.0*   Fasting Lipid Panel No results found for this basename: CHOL, HDL, LDLCALC, TRIG, CHOLHDL, LDLDIRECT,  in the last 72 hours Thyroid Function Tests No results found for  this basename: TSH, T4TOTAL, FREET3, T3FREE, THYROIDAB,  in the last 72 hours  TELE  Normal sinus rhythm and sinus tachycardia  ECG 03/13/13 Normal sinus rhythm Left axis deviation Left ventricular hypertrophy with repolarization abnormality Prolonged QT Abnormal ECG   Radiology/Studies  Dg Orthopantogram  03/11/2013   CLINICAL DATA:  Patient for aortic valve replacement.  EXAM: ORTHOPANTOGRAM/PANORAMIC  COMPARISON:  None.  FINDINGS: The patient is missing multiple teeth. No evidence of periapical abscess is identified. No focal bony lesion is seen.  IMPRESSION: No acute finding.   Electronically Signed   By: Inge Rise M.D.   On: 03/11/2013 13:24   Dg Chest 2 View  02/21/2013   CLINICAL DATA:  Cough and congestion ; shortness of breath  EXAM: CHEST  2 VIEW  COMPARISON:  February 05, 2013  FINDINGS: There is no edema or consolidation. There is a rather minimal right effusion. Heart is upper normal in size with normal pulmonary vascularity. No adenopathy. There are surgical clips in the upper abdomen.  IMPRESSION: Minimal right effusion.  No edema or consolidation.   Electronically Signed   By: Lowella Grip M.D.   On: 02/21/2013 10:03   Ct Chest Wo Contrast  03/11/2013   CLINICAL DATA:  58 -year-old female preoperative study for aortic valve replacement. Recent chest pain and weakness.  EXAM: CT CHEST WITHOUT CONTRAST  TECHNIQUE: Multi detector CT of the chest without contrast. Axial sagittal and coronal multiplanar reformatted images.  CONTRAST:  None.  COMPARISON:  Chest radiographs 03/08/2013 and earlier. Abdomen CT 01/29/2012 and earlier.  RADIOPHARMACEUTICALS:  None.  FLUOROSCOPY TIME:  None.  FINDINGS: Major airways are patent. Improved ventilation of both lung bases since the 2013 comparison. No pericardial or pleural effusion. No pulmonary consolidation.  No acute osseous abnormality.  Stable visualized upper abdominal viscera, numerous surgical clips about the liver  compatible with prior liver transplant. No upper abdominal free fluid.  Calcified coronary artery atherosclerosis. Calcifications at the aortic valve plane. No mediastinal lymphadenopathy. Negative non contrast thoracic inlet. No axillary lymphadenopathy.  IMPRESSION: 1. No acute findings in the chest. 2. Calcified coronary artery atherosclerosis. Aortic valve calcifications. 3. Sequelae of liver transplant.   Electronically Signed   By: Lars Pinks M.D.   On: 03/11/2013 11:57    ASSESSMENT AND PLAN 1. Severe aortic stenosis.  2. 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  3. ESRD on HD.  4. Tobacco abuse.  5. COPD, PFTs pending.  6. LDL 104, not on statin with history of liver  transplant.  7. History of liver transplant 2011, on tacrolimus.  Plan: Aortic valve replacement with mechanical valve scheduled for Tuesday.  Hemodialysis tomorrow.  Signed, Darlin Coco MD

## 2013-03-14 ENCOUNTER — Inpatient Hospital Stay (HOSPITAL_COMMUNITY): Admission: RE | Admit: 2013-03-14 | Payer: Medicare Other | Source: Ambulatory Visit

## 2013-03-14 ENCOUNTER — Inpatient Hospital Stay (HOSPITAL_COMMUNITY): Payer: Medicare Other

## 2013-03-14 DIAGNOSIS — R079 Chest pain, unspecified: Secondary | ICD-10-CM

## 2013-03-14 DIAGNOSIS — I359 Nonrheumatic aortic valve disorder, unspecified: Secondary | ICD-10-CM

## 2013-03-14 DIAGNOSIS — I2510 Atherosclerotic heart disease of native coronary artery without angina pectoris: Secondary | ICD-10-CM

## 2013-03-14 DIAGNOSIS — I5023 Acute on chronic systolic (congestive) heart failure: Secondary | ICD-10-CM

## 2013-03-14 LAB — PULMONARY FUNCTION TEST
DL/VA % pred: 97 %
DL/VA: 4.58 ml/min/mmHg/L
DLCO cor % pred: 70 %
DLCO cor: 16.12 ml/min/mmHg
DLCO unc % pred: 62 %
DLCO unc: 14.21 ml/min/mmHg
FEF 25-75 Post: 2.35 L/sec
FEF 25-75 Pre: 1.81 L/sec
FEF2575-%Change-Post: 29 %
FEF2575-%Pred-Post: 113 %
FEF2575-%Pred-Pre: 87 %
FEV1-%Change-Post: 4 %
FEV1-%Pred-Post: 98 %
FEV1-%Pred-Pre: 94 %
FEV1-Post: 2.01 L
FEV1-Pre: 1.92 L
FEV1FVC-%Change-Post: 4 %
FEV1FVC-%Pred-Pre: 99 %
FEV6-%Change-Post: 0 %
FEV6-%Pred-Post: 96 %
FEV6-%Pred-Pre: 96 %
FEV6-Post: 2.42 L
FEV6-Pre: 2.4 L
FEV6FVC-%Change-Post: 0 %
FEV6FVC-%Pred-Post: 103 %
FEV6FVC-%Pred-Pre: 102 %
FVC-%Change-Post: 0 %
FVC-%Pred-Post: 93 %
FVC-%Pred-Pre: 93 %
FVC-Post: 2.42 L
FVC-Pre: 2.42 L
Post FEV1/FVC ratio: 83 %
Post FEV6/FVC ratio: 100 %
Pre FEV1/FVC ratio: 80 %
Pre FEV6/FVC Ratio: 99 %
RV % pred: 96 %
RV: 1.85 L
TLC % pred: 85 %
TLC: 4.2 L

## 2013-03-14 LAB — RENAL FUNCTION PANEL
Albumin: 2.8 g/dL — ABNORMAL LOW (ref 3.5–5.2)
BUN: 44 mg/dL — ABNORMAL HIGH (ref 6–23)
CO2: 23 mEq/L (ref 19–32)
Calcium: 8.6 mg/dL (ref 8.4–10.5)
Chloride: 87 mEq/L — ABNORMAL LOW (ref 96–112)
Creatinine, Ser: 9.26 mg/dL — ABNORMAL HIGH (ref 0.50–1.10)
GFR calc Af Amer: 5 mL/min — ABNORMAL LOW (ref >90)
GFR calc non Af Amer: 4 mL/min — ABNORMAL LOW (ref >90)
Glucose, Bld: 154 mg/dL — ABNORMAL HIGH (ref 70–99)
Phosphorus: 6.9 mg/dL — ABNORMAL HIGH (ref 2.3–4.6)
Potassium: 4.9 mEq/L (ref 3.7–5.3)
Sodium: 129 mEq/L — ABNORMAL LOW (ref 137–147)

## 2013-03-14 LAB — GLUCOSE, CAPILLARY
GLUCOSE-CAPILLARY: 137 mg/dL — AB (ref 70–99)
Glucose-Capillary: 121 mg/dL — ABNORMAL HIGH (ref 70–99)
Glucose-Capillary: 230 mg/dL — ABNORMAL HIGH (ref 70–99)
Glucose-Capillary: 341 mg/dL — ABNORMAL HIGH (ref 70–99)

## 2013-03-14 LAB — APTT: aPTT: 24 s (ref 24–37)

## 2013-03-14 MED ORDER — CHLORHEXIDINE GLUCONATE 4 % EX LIQD
60.0000 mL | Freq: Once | CUTANEOUS | Status: AC
  Administered 2013-03-15: 4 via TOPICAL
  Filled 2013-03-14: qty 60

## 2013-03-14 MED ORDER — EPINEPHRINE HCL 1 MG/ML IJ SOLN
0.5000 ug/min | INTRAVENOUS | Status: DC
  Filled 2013-03-14: qty 4

## 2013-03-14 MED ORDER — DEXTROSE 5 % IV SOLN
1.5000 g | INTRAVENOUS | Status: AC
  Administered 2013-03-15: 1.5 g via INTRAVENOUS
  Administered 2013-03-15: .75 g via INTRAVENOUS
  Filled 2013-03-14: qty 1.5

## 2013-03-14 MED ORDER — DOPAMINE-DEXTROSE 3.2-5 MG/ML-% IV SOLN
2.0000 ug/kg/min | INTRAVENOUS | Status: DC
Start: 2013-03-15 — End: 2013-03-15
  Filled 2013-03-14: qty 250

## 2013-03-14 MED ORDER — DEXMEDETOMIDINE HCL IN NACL 400 MCG/100ML IV SOLN
0.1000 ug/kg/h | INTRAVENOUS | Status: DC
  Filled 2013-03-14: qty 100

## 2013-03-14 MED ORDER — SODIUM CHLORIDE 0.9 % IV SOLN
INTRAVENOUS | Status: DC
  Filled 2013-03-14: qty 1

## 2013-03-14 MED ORDER — ALBUTEROL SULFATE (2.5 MG/3ML) 0.083% IN NEBU
2.5000 mg | INHALATION_SOLUTION | Freq: Once | RESPIRATORY_TRACT | Status: AC
  Administered 2013-03-14: 2.5 mg via RESPIRATORY_TRACT

## 2013-03-14 MED ORDER — ALPRAZOLAM 0.25 MG PO TABS: 0.2500 mg | ORAL_TABLET | ORAL | Status: DC | PRN

## 2013-03-14 MED ORDER — BISACODYL 5 MG PO TBEC
5.0000 mg | DELAYED_RELEASE_TABLET | Freq: Once | ORAL | Status: AC
  Administered 2013-03-14: 5 mg via ORAL
  Filled 2013-03-14: qty 1

## 2013-03-14 MED ORDER — TEMAZEPAM 15 MG PO CAPS
15.0000 mg | ORAL_CAPSULE | Freq: Once | ORAL | Status: AC | PRN
  Administered 2013-03-14: 15 mg via ORAL
  Filled 2013-03-14: qty 1

## 2013-03-14 MED ORDER — MAGNESIUM SULFATE 50 % IJ SOLN
40.0000 meq | INTRAMUSCULAR | Status: DC
  Filled 2013-03-14: qty 10

## 2013-03-14 MED ORDER — PAPAVERINE HCL 30 MG/ML IJ SOLN
INTRAMUSCULAR | Status: AC
  Administered 2013-03-15: 09:00:00
  Filled 2013-03-14: qty 2.5

## 2013-03-14 MED ORDER — SODIUM CHLORIDE 0.9 % IV SOLN
INTRAVENOUS | Status: DC
  Filled 2013-03-14: qty 40

## 2013-03-14 MED ORDER — SODIUM CHLORIDE 0.9 % IV SOLN
INTRAVENOUS | Status: DC
  Filled 2013-03-14: qty 30

## 2013-03-14 MED ORDER — CHLORHEXIDINE GLUCONATE 4 % EX LIQD
60.0000 mL | Freq: Once | CUTANEOUS | Status: AC
  Administered 2013-03-14: 4 via TOPICAL
  Filled 2013-03-14: qty 60

## 2013-03-14 MED ORDER — NITROGLYCERIN IN D5W 200-5 MCG/ML-% IV SOLN
2.0000 ug/min | INTRAVENOUS | Status: DC
  Filled 2013-03-14: qty 250

## 2013-03-14 MED ORDER — DEXTROSE 5 % IV SOLN
750.0000 mg | INTRAVENOUS | Status: DC
  Filled 2013-03-14: qty 750

## 2013-03-14 MED ORDER — POTASSIUM CHLORIDE 2 MEQ/ML IV SOLN
80.0000 meq | INTRAVENOUS | Status: DC
  Filled 2013-03-14: qty 40

## 2013-03-14 MED ORDER — METOPROLOL TARTRATE 12.5 MG HALF TABLET
12.5000 mg | ORAL_TABLET | Freq: Once | ORAL | Status: AC
  Administered 2013-03-15: 12.5 mg via ORAL
  Filled 2013-03-14: qty 1

## 2013-03-14 MED ORDER — VANCOMYCIN HCL 10 G IV SOLR
1250.0000 mg | INTRAVENOUS | Status: AC
  Administered 2013-03-15: 1250 mg via INTRAVENOUS
  Filled 2013-03-14: qty 1250

## 2013-03-14 MED ORDER — PHENYLEPHRINE HCL 10 MG/ML IJ SOLN
30.0000 ug/min | INTRAVENOUS | Status: DC
  Filled 2013-03-14: qty 2

## 2013-03-14 NOTE — Progress Notes (Signed)
[facility] ROUNDING NOTE   Subjective:   Interval History: stable no complaints critical Aortic stenosis  Usually TTS but planning dialysis today  Objective:  Vital signs in last 24 hours:  Temp:  [98 F (36.7 C)-98.3 F (36.8 C)] 98.2 F (36.8 C) (02/02 0605) Pulse Rate:  [65-69] 69 (02/02 0605) Resp:  [18] 18 (02/02 0605) BP: (121-161)/(72-77) 161/76 mmHg (02/02 0605) SpO2:  [98 %-100 %] 100 % (02/02 0605)  Weight change:  Filed Weights   03/11/13 0536 03/12/13 0713 03/12/13 1103  Weight: 76.068 kg (167 lb 11.2 oz) 77.4 kg (170 lb 10.2 oz) 75.3 kg (166 lb 0.1 oz)    Intake/Output: I/O last 3 completed shifts: In: 600 [P.O.:600] Out: -    Intake/Output this shift:  Total I/O In: 360 [P.O.:360] Out: -  General: NAD on room air  Heart: RRR 3/6 murmur  Lungs: no wheezes or rales  Abdomen: soft NT  Extremities: no LE edema  Dialysis Access: left thigh AVGG Qb 161    Basic Metabolic Panel:  Recent Labs Lab 03/08/13 1823 03/09/13 1245 03/10/13 1055 03/12/13 0735 03/13/13 0544  NA 142  --  137 134* 136*  K 3.8  --  5.2 4.9 5.0  CL 98  --  96 94* 93*  CO2 29  --  28 22 28   GLUCOSE 126*  --  142* 191* 179*  BUN 20  --  24* 49* 28*  CREATININE 5.34* 7.58* 5.84* 9.81* 6.58*  CALCIUM 8.6  --  8.5 8.4 8.4    Liver Function Tests:  Recent Labs Lab 03/08/13 1823 03/13/13 0544  AST 22 26  ALT 18 18  ALKPHOS 68 66  BILITOT 0.3 0.3  PROT 6.9 6.5  ALBUMIN 2.9* 2.7*   No results found for this basename: LIPASE, AMYLASE,  in the last 168 hours No results found for this basename: AMMONIA,  in the last 168 hours  CBC:  Recent Labs Lab 03/08/13 1823 03/09/13 1245 03/13/13 0544  WBC 6.9 5.9 3.9*  HGB 10.6* 12.4 10.1*  HCT 31.4* 37.2 29.2*  MCV 96.0 97.4 95.4  PLT 127* 139* 95*    Cardiac Enzymes:  Recent Labs Lab 03/08/13 1823 03/09/13 0450  TROPONINI <0.30 <0.30    BNP: No components found with this basename: POCBNP,    CBG:  Recent Labs Lab 03/13/13 0749 03/13/13 1140 03/13/13 1629 03/13/13 2057 03/14/13 0733  GLUCAP 131* 147* 268* 135* 137*    Microbiology: Results for orders placed during the hospital encounter of 03/08/13  MRSA PCR SCREENING     Status: None   Collection Time    03/08/13  5:52 PM      Result Value Range Status   MRSA by PCR NEGATIVE  NEGATIVE Final   Comment:            The GeneXpert MRSA Assay (FDA     approved for NASAL specimens     only), is one component of a     comprehensive MRSA colonization     surveillance program. It is not     intended to diagnose MRSA     infection nor to guide or     monitor treatment for     MRSA infections.    Coagulation Studies: No results found for this basename: LABPROT, INR,  in the last 72 hours  Urinalysis: No results found for this basename: COLORURINE, APPERANCEUR, LABSPEC, PHURINE, GLUCOSEU, HGBUR, BILIRUBINUR, KETONESUR, PROTEINUR, UROBILINOGEN, NITRITE, LEUKOCYTESUR,  in the last  72 hours    Imaging: No results found.   Medications:     . aspirin EC  81 mg Oral Daily  . budesonide-formoterol  2 puff Inhalation BID  . calcium carbonate  1 tablet Oral Daily  . cinacalcet  30 mg Oral Q breakfast  . docusate sodium  100 mg Oral Daily  . doxercalciferol  4 mcg Intravenous Q T,Th,Sa-HD  . DULoxetine  60 mg Oral QHS  . enoxaparin (LOVENOX) injection  30 mg Subcutaneous Q24H  . gabapentin  300 mg Oral QHS  . insulin aspart  0-15 Units Subcutaneous TID WC  . insulin aspart  0-5 Units Subcutaneous QHS  . levothyroxine  150 mcg Oral QAC breakfast  . LORazepam  0.5 mg Oral QHS  . metoprolol tartrate  25 mg Oral BID  . multivitamin with minerals  1 tablet Oral Daily  . nicotine  7 mg Transdermal Daily  . pantoprazole  40 mg Oral Daily  . senna  1 tablet Oral QPM  . tacrolimus  5 mg Oral BID   sodium chloride, sodium chloride, sodium chloride, sodium chloride, acetaminophen, albuterol, feeding supplement (NEPRO  CARB STEADY), heparin, lidocaine (PF), lidocaine-prilocaine, nitroGLYCERIN, ondansetron (ZOFRAN) IV, pentafluoroprop-tetrafluoroeth, promethazine, traMADol  Assessment/ Plan:  1. Severe aortic stenosis / CAD, for AVR/CABG tomorrow 2.ESRD TTS - labs pending; plan next HD Monday due to Tuesday surgery  3.HTN/vol, at dry wt, on metoprolol 25 bid  4.Anemia - Hgb 12.4 1/28 No ESA at present  5.Sec HPT - Ca 8.5; Sensipar 30 , 1 CA CO3 daily - resume hectorol 4  6 DM - insulin per primary.  7.Liver transplant - on Prograf  8Tobacco abuse     LOS: 6 Shacoya Burkhammer W @TODAY @10 :08 AM

## 2013-03-14 NOTE — Progress Notes (Signed)
Subjective: Mild SOB with ambulation this AM, last chest pain last week  Objective: Vital signs in last 24 hours: Temp:  [98 F (36.7 C)-98.3 F (36.8 C)] 98.2 F (36.8 C) (02/02 0605) Pulse Rate:  [65-79] 69 (02/02 0605) Resp:  [18] 18 (02/02 0605) BP: (121-161)/(70-77) 161/76 mmHg (02/02 0605) SpO2:  [98 %-100 %] 100 % (02/02 0605) Weight change:  Last BM Date: 03/12/13 Intake/Output from previous day: +360 (since admit -1008) wt 166 down from 169 on admit  02/01 0701 - 02/02 0700 In: 360 [P.O.:360] Out: -  Intake/Output this shift:    PE: General:Pleasant affect, NAD Skin:Warm and dry, brisk capillary refill HEENT:normocephalic, sclera clear, mucus membranes moist Heart:S1S2 RRR with 3/6 systolic murmur, no gallup, rub or click Lungs:clear without rales, rhonchi, or wheezes BZJ:IRCV, non tender, + BS, do not palpate liver spleen or masses Ext:no lower ext edema Neuro:alert and oriented, MAE, follows commands, + facial symmetry   Lab Results:  Recent Labs  03/13/13 0544  WBC 3.9*  HGB 10.1*  HCT 29.2*  PLT 95*   BMET  Recent Labs  03/12/13 0735 03/13/13 0544  NA 134* 136*  K 4.9 5.0  CL 94* 93*  CO2 22 28  GLUCOSE 191* 179*  BUN 49* 28*  CREATININE 9.81* 6.58*  CALCIUM 8.4 8.4   No results found for this basename: TROPONINI, CK, MB,  in the last 72 hours   Lab Results  Component Value Date   HGBA1C 8.0* 03/11/2013     Lab Results  Component Value Date   TSH 1.182 03/08/2013    Hepatic Function Panel  Recent Labs  03/13/13 0544  PROT 6.5  ALBUMIN 2.7*  AST 26  ALT 18  ALKPHOS 66  BILITOT 0.3   No results found for this basename: CHOL,  in the last 72 hours No results found for this basename: PROTIME,  in the last 72 hours     Studies/Results: No results found.  Medications: I have reviewed the patient's current medications. Scheduled Meds: . aspirin EC  81 mg Oral Daily  . budesonide-formoterol  2 puff  Inhalation BID  . calcium carbonate  1 tablet Oral Daily  . cinacalcet  30 mg Oral Q breakfast  . docusate sodium  100 mg Oral Daily  . doxercalciferol  4 mcg Intravenous Q T,Th,Sa-HD  . DULoxetine  60 mg Oral QHS  . enoxaparin (LOVENOX) injection  30 mg Subcutaneous Q24H  . gabapentin  300 mg Oral QHS  . insulin aspart  0-15 Units Subcutaneous TID WC  . insulin aspart  0-5 Units Subcutaneous QHS  . levothyroxine  150 mcg Oral QAC breakfast  . LORazepam  0.5 mg Oral QHS  . metoprolol tartrate  25 mg Oral BID  . multivitamin with minerals  1 tablet Oral Daily  . nicotine  7 mg Transdermal Daily  . pantoprazole  40 mg Oral Daily  . senna  1 tablet Oral QPM  . tacrolimus  5 mg Oral BID   Continuous Infusions:  PRN Meds:.sodium chloride, sodium chloride, sodium chloride, sodium chloride, acetaminophen, albuterol, feeding supplement (NEPRO CARB STEADY), heparin, lidocaine (PF), lidocaine-prilocaine, nitroGLYCERIN, ondansetron (ZOFRAN) IV, pentafluoroprop-tetrafluoroeth, promethazine, traMADol  Assessment/Plan: Principal Problem:   Chest pain- with tachycardia Active Problems:   HEPATITIS C- s/p liver transplant 2011   HYPOTHYROIDISM   DIABETES MELLITUS, TYPE II   ANEMIA, CHRONIC   HTN- LVH, EF45- 50%. grade 2 diastolic dysf. echo 8/93/81  GERD   End stage renal disease on dialysis-TTS   CAD- moderate with 50% D1, 20% LAD Jan. 2015   Aortic stenosis, moderate-severe at cath Jan 2015.    Dyslipidemia-LDL 104, not on statin with Hx of liver transplant   Chronic combined systolic and diastolic congestive heart failure   COPD (chronic obstructive pulmonary disease)   Tobacco abuse   Palpitations- run of tachycardia after HD, lasted 15-20 minutes  PLAN: AVR with Mechanical valve tomorrow, the 3rd.  HD today.   Non obst CAD.    LOS: 6 days   Time spent with pt. :15 minutes. [facility] R  Nurse Practitioner Certified Pager [phone] or after 5pm and on weekends call  [phone] 03/14/2013, 7:53 AM

## 2013-03-14 NOTE — Progress Notes (Signed)
1102 Came to see to offer to walk. Pt at PFTs per RN. Will follow up as time permits. Graylon Good RN BSN 03/14/2013 11:08 AM

## 2013-03-14 NOTE — Progress Notes (Signed)
Patient seen and examined. Agree with assessment and plan as outlined by PA.  Doing well with only mild SOB. Lungs are clear and she has no edema.  2/6 SM at RUSB radiating to LLSB.  Plan for mechanical AVR tomorrow.  For HD today.

## 2013-03-14 NOTE — Progress Notes (Signed)
4 Days Post-Op Procedure(s) (LRB): LEFT HEART CATHETERIZATION WITH CORONARY ANGIOGRAM (N/A) Subjective: Severe aortic stenosis with one vessel CAD Tolerated hemodialysis today Maintaining sinus rhythm Hematocrit 29 platelet count 95,000 glucose 170 Preoperative Dopplers, vein mapping, PFTs reviewed Plan aVR with mechanical valve and single-vessel CABG in a.m. Objective: Vital signs in last 24 hours: Temp:  [97.6 F (36.4 C)-98.3 F (36.8 C)] 97.8 F (36.6 C) (02/02 1805) Pulse Rate:  [65-71] 71 (02/02 1805) Cardiac Rhythm:  [-] Heart block (02/02 0830) Resp:  [14-21] 16 (02/02 1805) BP: (91-161)/(53-86) 124/71 mmHg (02/02 1805) SpO2:  [98 %-100 %] 100 % (02/02 1805) Weight:  [164 lb 0.4 oz (74.4 kg)-172 lb 9.9 oz (78.3 kg)] 164 lb 0.4 oz (74.4 kg) (02/02 1805)  Hemodynamic parameters for last 24 hours:   Stable afebrile normal sinus rhythm Intake/Output from previous day: 02/01 0701 - 02/02 0700 In: 360 [P.O.:360] Out: -  Intake/Output this shift:    Exam Lungs clear 3 or 6 systolic ejection murmur, regular rhythm Minimal edema Neuro intact  Lab Results:  Recent Labs  03/13/13 0544  WBC 3.9*  HGB 10.1*  HCT 29.2*  PLT 95*   BMET:  Recent Labs  03/13/13 0544 03/14/13 1411  NA 136* 129*  K 5.0 4.9  CL 93* 87*  CO2 28 23  GLUCOSE 179* 154*  BUN 28* 44*  CREATININE 6.58* 9.26*  CALCIUM 8.4 8.6    PT/INR: No results found for this basename: LABPROT, INR,  in the last 72 hours ABG    Component Value Date/Time   PHART 7.406 03/11/2013 0408   HCO3 27.1* 03/11/2013 0408   TCO2 28.4 03/11/2013 0408   O2SAT 96.2 03/11/2013 0408   CBG (last 3)   Recent Labs  03/14/13 0733 03/14/13 1207 03/14/13 1848  GLUCAP 137* 230* 121*    Assessment/Plan: S/P Procedure(s) (LRB): LEFT HEART CATHETERIZATION WITH CORONARY ANGIOGRAM (N/A) Plan OR in a.m. Procedure risks and benefits discussed with patient Patient understands that with preoperative anemia on  hemodialysis she will need blood products for heart surgery and she is agreeable. She understands the alternatives to surgery and the risks of those alternatives. She agrees to proceed with aVR, CABG   LOS: 6 days    VAN TRIGT III,PETER 03/14/2013

## 2013-03-14 NOTE — Progress Notes (Signed)
UR Completed Libbey Duce Graves-Bigelow, RN,BSN [phone]  

## 2013-03-15 ENCOUNTER — Inpatient Hospital Stay (HOSPITAL_COMMUNITY): Payer: Medicare Other | Admitting: Anesthesiology

## 2013-03-15 ENCOUNTER — Encounter (HOSPITAL_COMMUNITY): Payer: Medicare Other | Admitting: Anesthesiology

## 2013-03-15 ENCOUNTER — Encounter (HOSPITAL_COMMUNITY)
Admission: AD | Disposition: A | Discharge status: Skilled Nursing Facility | Payer: Medicare Other | Source: Other Acute Inpatient Hospital | Attending: Cardiothoracic Surgery

## 2013-03-15 ENCOUNTER — Inpatient Hospital Stay (HOSPITAL_COMMUNITY): Payer: Medicare Other

## 2013-03-15 DIAGNOSIS — Z952 Presence of prosthetic heart valve: Secondary | ICD-10-CM

## 2013-03-15 DIAGNOSIS — I2510 Atherosclerotic heart disease of native coronary artery without angina pectoris: Secondary | ICD-10-CM

## 2013-03-15 DIAGNOSIS — I359 Nonrheumatic aortic valve disorder, unspecified: Secondary | ICD-10-CM

## 2013-03-15 HISTORY — DX: Presence of prosthetic heart valve: Z95.2

## 2013-03-15 HISTORY — PX: CORONARY ARTERY BYPASS GRAFT: SHX141

## 2013-03-15 HISTORY — PX: AORTIC VALVE REPLACEMENT: SHX41

## 2013-03-15 HISTORY — PX: INTRAOPERATIVE TRANSESOPHAGEAL ECHOCARDIOGRAM: SHX5062

## 2013-03-15 LAB — POCT I-STAT 4, (NA,K, GLUC, HGB,HCT)
GLUCOSE: 143 mg/dL — AB (ref 70–99)
GLUCOSE: 109 mg/dL — AB (ref 70–99)
GLUCOSE: 93 mg/dL (ref 70–99)
Glucose, Bld: 118 mg/dL — ABNORMAL HIGH (ref 70–99)
Glucose, Bld: 128 mg/dL — ABNORMAL HIGH (ref 70–99)
Glucose, Bld: 180 mg/dL — ABNORMAL HIGH (ref 70–99)
Glucose, Bld: 193 mg/dL — ABNORMAL HIGH (ref 70–99)
HCT: 24 % — ABNORMAL LOW (ref 36.0–46.0)
HCT: 25 % — ABNORMAL LOW (ref 36.0–46.0)
HCT: 25 % — ABNORMAL LOW (ref 36.0–46.0)
HCT: 27 % — ABNORMAL LOW (ref 36.0–46.0)
HCT: 34 % — ABNORMAL LOW (ref 36.0–46.0)
HEMATOCRIT: 29 % — AB (ref 36.0–46.0)
HEMATOCRIT: 25 % — AB (ref 36.0–46.0)
HEMOGLOBIN: 9.9 g/dL — AB (ref 12.0–15.0)
HEMOGLOBIN: 8.5 g/dL — AB (ref 12.0–15.0)
HEMOGLOBIN: 9.2 g/dL — AB (ref 12.0–15.0)
Hemoglobin: 8.2 g/dL — ABNORMAL LOW (ref 12.0–15.0)
Hemoglobin: 8.5 g/dL — ABNORMAL LOW (ref 12.0–15.0)
Hemoglobin: 8.5 g/dL — ABNORMAL LOW (ref 12.0–15.0)
Hemoglobin: 11.6 g/dL — ABNORMAL LOW (ref 12.0–15.0)
POTASSIUM: 4.4 meq/L (ref 3.7–5.3)
POTASSIUM: 4.2 meq/L (ref 3.7–5.3)
Potassium: 4.2 mEq/L (ref 3.7–5.3)
Potassium: 4.3 mEq/L (ref 3.7–5.3)
Potassium: 5.4 mEq/L — ABNORMAL HIGH (ref 3.7–5.3)
Potassium: 4.7 mEq/L (ref 3.7–5.3)
Potassium: 5.6 mEq/L — ABNORMAL HIGH (ref 3.7–5.3)
SODIUM: 133 meq/L — AB (ref 137–147)
SODIUM: 139 meq/L (ref 137–147)
Sodium: 135 mEq/L — ABNORMAL LOW (ref 137–147)
Sodium: 135 mEq/L — ABNORMAL LOW (ref 137–147)
Sodium: 136 mEq/L — ABNORMAL LOW (ref 137–147)
Sodium: 137 mEq/L (ref 137–147)
Sodium: 137 mEq/L (ref 137–147)

## 2013-03-15 LAB — CBC
HCT: 28.6 % — ABNORMAL LOW (ref 36.0–46.0)
HCT: 26.6 % — ABNORMAL LOW (ref 36.0–46.0)
HEMATOCRIT: 30.8 % — AB (ref 36.0–46.0)
HEMOGLOBIN: 10.8 g/dL — AB (ref 12.0–15.0)
Hemoglobin: 9.8 g/dL — ABNORMAL LOW (ref 12.0–15.0)
Hemoglobin: 9.4 g/dL — ABNORMAL LOW (ref 12.0–15.0)
MCH: 32.7 pg (ref 26.0–34.0)
MCH: 32 pg (ref 26.0–34.0)
MCH: 31.6 pg (ref 26.0–34.0)
MCHC: 34.3 g/dL (ref 30.0–36.0)
MCHC: 35.1 g/dL (ref 30.0–36.0)
MCHC: 35.3 g/dL (ref 30.0–36.0)
MCV: 95.3 fL (ref 78.0–100.0)
MCV: 91.1 fL (ref 78.0–100.0)
MCV: 89.6 fL (ref 78.0–100.0)
Platelets: 79 10*3/uL — ABNORMAL LOW (ref 150–400)
Platelets: 100 10*3/uL — ABNORMAL LOW (ref 150–400)
Platelets: 171 10*3/uL (ref 150–400)
RBC: 3 MIL/uL — ABNORMAL LOW (ref 3.87–5.11)
RBC: 3.38 MIL/uL — AB (ref 3.87–5.11)
RBC: 2.97 MIL/uL — ABNORMAL LOW (ref 3.87–5.11)
RDW: 14.2 % (ref 11.5–15.5)
RDW: 14.7 % (ref 11.5–15.5)
RDW: 15.3 % (ref 11.5–15.5)
WBC: 3.9 10*3/uL — ABNORMAL LOW (ref 4.0–10.5)
WBC: 9 10*3/uL (ref 4.0–10.5)
WBC: 10.8 10*3/uL — ABNORMAL HIGH (ref 4.0–10.5)

## 2013-03-15 LAB — CREATININE, SERUM
Creatinine, Ser: 5.49 mg/dL — ABNORMAL HIGH (ref 0.50–1.10)
GFR calc Af Amer: 9 mL/min — ABNORMAL LOW (ref >90)
GFR calc non Af Amer: 8 mL/min — ABNORMAL LOW (ref >90)

## 2013-03-15 LAB — POCT I-STAT, CHEM 8
BUN: 27 mg/dL — ABNORMAL HIGH (ref 6–23)
CREATININE: 5.7 mg/dL — AB (ref 0.50–1.10)
Calcium, Ion: 1.14 mmol/L (ref 1.12–1.23)
Chloride: 105 mEq/L (ref 96–112)
Glucose, Bld: 125 mg/dL — ABNORMAL HIGH (ref 70–99)
HEMATOCRIT: 28 % — AB (ref 36.0–46.0)
Hemoglobin: 9.5 g/dL — ABNORMAL LOW (ref 12.0–15.0)
Potassium: 4.8 mEq/L (ref 3.7–5.3)
SODIUM: 136 meq/L — AB (ref 137–147)
TCO2: 21 mmol/L (ref 0–100)

## 2013-03-15 LAB — BASIC METABOLIC PANEL
BUN: 23 mg/dL (ref 6–23)
CO2: 27 mEq/L (ref 19–32)
Calcium: 8.8 mg/dL (ref 8.4–10.5)
Chloride: 93 mEq/L — ABNORMAL LOW (ref 96–112)
Creatinine, Ser: 5.74 mg/dL — ABNORMAL HIGH (ref 0.50–1.10)
GFR calc Af Amer: 9 mL/min — ABNORMAL LOW (ref >90)
GFR calc non Af Amer: 7 mL/min — ABNORMAL LOW (ref >90)
Glucose, Bld: 130 mg/dL — ABNORMAL HIGH (ref 70–99)
Potassium: 4.4 mEq/L (ref 3.7–5.3)
Sodium: 137 mEq/L (ref 137–147)

## 2013-03-15 LAB — POCT I-STAT 3, ART BLOOD GAS (G3+)
ACID-BASE DEFICIT: 2 mmol/L (ref 0.0–2.0)
Acid-base deficit: 2 mmol/L (ref 0.0–2.0)
Acid-base deficit: 1 mmol/L (ref 0.0–2.0)
Bicarbonate: 24.7 mEq/L — ABNORMAL HIGH (ref 20.0–24.0)
Bicarbonate: 24 mEq/L (ref 20.0–24.0)
Bicarbonate: 22.1 mEq/L (ref 20.0–24.0)
Bicarbonate: 21.8 mEq/L (ref 20.0–24.0)
O2 SAT: 100 %
O2 SAT: 97 %
O2 SAT: 100 %
O2 Saturation: 100 %
PCO2 ART: 26.8 mmHg — AB (ref 35.0–45.0)
PH ART: 7.412 (ref 7.350–7.450)
PO2 ART: 87 mmHg (ref 80.0–100.0)
Patient temperature: 35.4
Patient temperature: 37
TCO2: 26 mmol/L (ref 0–100)
TCO2: 25 mmol/L (ref 0–100)
TCO2: 23 mmol/L (ref 0–100)
TCO2: 23 mmol/L (ref 0–100)
pCO2 arterial: 38.8 mmHg (ref 35.0–45.0)
pCO2 arterial: 44.9 mmHg (ref 35.0–45.0)
pCO2 arterial: 33.6 mmHg — ABNORMAL LOW (ref 35.0–45.0)
pH, Arterial: 7.335 — ABNORMAL LOW (ref 7.350–7.450)
pH, Arterial: 7.42 (ref 7.350–7.450)
pH, Arterial: 7.518 — ABNORMAL HIGH (ref 7.350–7.450)
pO2, Arterial: 284 mmHg — ABNORMAL HIGH (ref 80.0–100.0)
pO2, Arterial: 407 mmHg — ABNORMAL HIGH (ref 80.0–100.0)
pO2, Arterial: 252 mmHg — ABNORMAL HIGH (ref 80.0–100.0)

## 2013-03-15 LAB — PROTIME-INR
INR: 1.2 (ref 0.00–1.49)
PROTHROMBIN TIME: 14.9 s (ref 11.6–15.2)

## 2013-03-15 LAB — APTT: APTT: 47 s — AB (ref 24–37)

## 2013-03-15 LAB — PLATELET COUNT: Platelets: 48 10*3/uL — ABNORMAL LOW (ref 150–400)

## 2013-03-15 LAB — PREPARE RBC (CROSSMATCH)

## 2013-03-15 LAB — GLUCOSE, CAPILLARY
GLUCOSE-CAPILLARY: 131 mg/dL — AB (ref 70–99)
GLUCOSE-CAPILLARY: 171 mg/dL — AB (ref 70–99)
GLUCOSE-CAPILLARY: 145 mg/dL — AB (ref 70–99)

## 2013-03-15 LAB — POCT I-STAT GLUCOSE
GLUCOSE: 91 mg/dL (ref 70–99)
Operator id: 3406

## 2013-03-15 LAB — MAGNESIUM: Magnesium: 2 mg/dL (ref 1.5–2.5)

## 2013-03-15 LAB — HEMOGLOBIN AND HEMATOCRIT, BLOOD
HCT: 24.8 % — ABNORMAL LOW (ref 36.0–46.0)
Hemoglobin: 8.6 g/dL — ABNORMAL LOW (ref 12.0–15.0)

## 2013-03-15 SURGERY — REPLACEMENT, AORTIC VALVE, OPEN
Anesthesia: General | Site: Chest

## 2013-03-15 MED ORDER — METHYLPREDNISOLONE SODIUM SUCC 125 MG IJ SOLR
INTRAMUSCULAR | Status: DC | PRN
  Administered 2013-03-15: 125 mg via INTRAVENOUS

## 2013-03-15 MED ORDER — INSULIN REGULAR BOLUS VIA INFUSION
0.0000 [IU] | Freq: Three times a day (TID) | INTRAVENOUS | Status: AC
  Filled 2013-03-15: qty 10

## 2013-03-15 MED ORDER — 0.9 % SODIUM CHLORIDE (POUR BTL) OPTIME
TOPICAL | Status: DC | PRN
  Administered 2013-03-15: 1000 mL

## 2013-03-15 MED ORDER — SODIUM CHLORIDE 0.9 % IJ SOLN: 3.0000 mL | INTRAMUSCULAR | Status: DC | PRN

## 2013-03-15 MED ORDER — LACTATED RINGERS IV SOLN: 500.0000 mL | Freq: Once | INTRAVENOUS | Status: DC | PRN

## 2013-03-15 MED ORDER — HEPARIN SODIUM (PORCINE) 1000 UNIT/ML IJ SOLN
INTRAMUSCULAR | Status: DC | PRN
  Administered 2013-03-15 (×4): 5 mL via INTRAVENOUS

## 2013-03-15 MED ORDER — EPHEDRINE SULFATE 50 MG/ML IJ SOLN
INTRAMUSCULAR | Status: AC
  Filled 2013-03-15: qty 1

## 2013-03-15 MED ORDER — SODIUM CHLORIDE 0.9 % IV SOLN
INTRAVENOUS | Status: DC | PRN
  Administered 2013-03-15: 07:00:00 via INTRAVENOUS

## 2013-03-15 MED ORDER — ONDANSETRON HCL 4 MG/2ML IJ SOLN
4.0000 mg | Freq: Four times a day (QID) | INTRAMUSCULAR | Status: DC | PRN
  Administered 2013-03-17 – 2013-03-19 (×2): 4 mg via INTRAVENOUS
  Filled 2013-03-15 (×2): qty 2

## 2013-03-15 MED ORDER — PANTOPRAZOLE SODIUM 40 MG PO TBEC
40.0000 mg | DELAYED_RELEASE_TABLET | Freq: Every day | ORAL | Status: DC
  Administered 2013-03-17 – 2013-03-29 (×13): 40 mg via ORAL
  Filled 2013-03-15 (×13): qty 1

## 2013-03-15 MED ORDER — VANCOMYCIN HCL IN DEXTROSE 1-5 GM/200ML-% IV SOLN: 1000.0000 mg | INTRAVENOUS | Status: DC

## 2013-03-15 MED ORDER — SODIUM CHLORIDE 0.9 % IV SOLN
200.0000 ug | INTRAVENOUS | Status: DC | PRN
  Administered 2013-03-15: 0.2 ug/kg/h via INTRAVENOUS

## 2013-03-15 MED ORDER — SODIUM CHLORIDE 0.9 % IV SOLN: 250.0000 mL | INTRAVENOUS | Status: DC

## 2013-03-15 MED ORDER — OXYCODONE HCL 5 MG PO TABS: 5.0000 mg | ORAL_TABLET | ORAL | Status: DC | PRN

## 2013-03-15 MED ORDER — SODIUM CHLORIDE 0.9 % IV SOLN
INTRAVENOUS | Status: AC
  Administered 2013-03-16: 2 [IU]/h via INTRAVENOUS
  Filled 2013-03-15 (×2): qty 1

## 2013-03-15 MED ORDER — SODIUM CHLORIDE 0.45 % IV SOLN
INTRAVENOUS | Status: DC
  Administered 2013-03-15: 15:00:00 via INTRAVENOUS
  Administered 2013-03-17: 20 mL/h via INTRAVENOUS

## 2013-03-15 MED ORDER — DEXTROSE 5 % IV SOLN
0.0000 ug/min | INTRAVENOUS | Status: DC
  Administered 2013-03-16: 60 ug/min via INTRAVENOUS
  Administered 2013-03-16: 40 ug/min via INTRAVENOUS
  Administered 2013-03-17: 10 ug/min via INTRAVENOUS
  Filled 2013-03-15 (×5): qty 2

## 2013-03-15 MED ORDER — DOCUSATE SODIUM 100 MG PO CAPS
200.0000 mg | ORAL_CAPSULE | Freq: Every day | ORAL | Status: DC
  Administered 2013-03-16 – 2013-03-29 (×11): 200 mg via ORAL
  Filled 2013-03-15 (×14): qty 2

## 2013-03-15 MED ORDER — MORPHINE SULFATE 2 MG/ML IJ SOLN
1.0000 mg | INTRAMUSCULAR | Status: AC | PRN
  Administered 2013-03-15 – 2013-03-16 (×3): 2 mg via INTRAVENOUS
  Filled 2013-03-15 (×3): qty 1

## 2013-03-15 MED ORDER — DOPAMINE-DEXTROSE 3.2-5 MG/ML-% IV SOLN: 0.0000 ug/kg/min | INTRAVENOUS | Status: AC

## 2013-03-15 MED ORDER — FENTANYL CITRATE 0.05 MG/ML IJ SOLN
INTRAMUSCULAR | Status: AC
  Filled 2013-03-15: qty 5

## 2013-03-15 MED ORDER — ROCURONIUM BROMIDE 100 MG/10ML IV SOLN
INTRAVENOUS | Status: DC | PRN
  Administered 2013-03-15: 50 mg via INTRAVENOUS
  Administered 2013-03-15: 100 mg via INTRAVENOUS
  Administered 2013-03-15: 50 mg via INTRAVENOUS

## 2013-03-15 MED ORDER — MIDAZOLAM HCL 5 MG/5ML IJ SOLN
INTRAMUSCULAR | Status: DC | PRN
  Administered 2013-03-15: 5 mg via INTRAVENOUS
  Administered 2013-03-15: 1 mg via INTRAVENOUS
  Administered 2013-03-15: 4 mg via INTRAVENOUS

## 2013-03-15 MED ORDER — SODIUM CHLORIDE 0.9 % IV SOLN
100.0000 [IU] | INTRAVENOUS | Status: DC | PRN
  Administered 2013-03-15: 1 [IU]/h via INTRAVENOUS

## 2013-03-15 MED ORDER — LACTATED RINGERS IV SOLN: INTRAVENOUS | Status: DC

## 2013-03-15 MED ORDER — MORPHINE SULFATE 2 MG/ML IJ SOLN
2.0000 mg | INTRAMUSCULAR | Status: DC | PRN
  Administered 2013-03-16 – 2013-03-20 (×10): 2 mg via INTRAVENOUS
  Filled 2013-03-15 (×10): qty 1

## 2013-03-15 MED ORDER — PROTAMINE SULFATE 10 MG/ML IV SOLN
INTRAVENOUS | Status: DC | PRN
  Administered 2013-03-15 (×2): 20 mg via INTRAVENOUS
  Administered 2013-03-15 (×3): 30 mg via INTRAVENOUS
  Administered 2013-03-15: 20 mg via INTRAVENOUS

## 2013-03-15 MED ORDER — HEMOSTATIC AGENTS (NO CHARGE) OPTIME
TOPICAL | Status: DC | PRN
  Administered 2013-03-15: 1 via TOPICAL

## 2013-03-15 MED ORDER — SODIUM CHLORIDE 0.9 % IV SOLN
INTRAVENOUS | Status: DC
  Administered 2013-03-15: 20 mL via INTRAVENOUS
  Administered 2013-03-16: 20 mL/h via INTRAVENOUS

## 2013-03-15 MED ORDER — MIDAZOLAM HCL 10 MG/2ML IJ SOLN
INTRAMUSCULAR | Status: AC
  Filled 2013-03-15: qty 2

## 2013-03-15 MED ORDER — MIDAZOLAM HCL 2 MG/2ML IJ SOLN
2.0000 mg | INTRAMUSCULAR | Status: DC | PRN
Start: 2013-03-15 — End: 2013-03-18

## 2013-03-15 MED ORDER — METOPROLOL TARTRATE 1 MG/ML IV SOLN
2.5000 mg | INTRAVENOUS | Status: DC | PRN
Start: 2013-03-15 — End: 2013-03-22

## 2013-03-15 MED ORDER — POTASSIUM CHLORIDE 10 MEQ/50ML IV SOLN: 10.0000 meq | INTRAVENOUS | Status: AC

## 2013-03-15 MED ORDER — ARTIFICIAL TEARS OP OINT
TOPICAL_OINTMENT | OPHTHALMIC | Status: AC
  Filled 2013-03-15: qty 3.5

## 2013-03-15 MED ORDER — DEXTROSE 5 % IV SOLN
1.5000 g | INTRAVENOUS | Status: AC
  Administered 2013-03-15 – 2013-03-16 (×2): 1.5 g via INTRAVENOUS
  Filled 2013-03-15 (×4): qty 1.5

## 2013-03-15 MED ORDER — BUDESONIDE-FORMOTEROL FUMARATE 160-4.5 MCG/ACT IN AERO
2.0000 | INHALATION_SPRAY | Freq: Two times a day (BID) | RESPIRATORY_TRACT | Status: DC
  Administered 2013-03-16 – 2013-03-29 (×23): 2 via RESPIRATORY_TRACT
  Filled 2013-03-15 (×3): qty 6

## 2013-03-15 MED ORDER — BISACODYL 5 MG PO TBEC
10.0000 mg | DELAYED_RELEASE_TABLET | Freq: Every day | ORAL | Status: DC
  Administered 2013-03-16 – 2013-03-29 (×9): 10 mg via ORAL
  Filled 2013-03-15 (×10): qty 2

## 2013-03-15 MED ORDER — NITROGLYCERIN IN D5W 200-5 MCG/ML-% IV SOLN
INTRAVENOUS | Status: DC | PRN
  Administered 2013-03-15: 5 ug/min via INTRAVENOUS

## 2013-03-15 MED ORDER — LEVALBUTEROL HCL 1.25 MG/0.5ML IN NEBU
1.2500 mg | INHALATION_SOLUTION | Freq: Four times a day (QID) | RESPIRATORY_TRACT | Status: DC
  Administered 2013-03-16 – 2013-03-20 (×18): 1.25 mg via RESPIRATORY_TRACT
  Filled 2013-03-15 (×22): qty 0.5

## 2013-03-15 MED ORDER — ROCURONIUM BROMIDE 50 MG/5ML IV SOLN
INTRAVENOUS | Status: AC
  Filled 2013-03-15: qty 2

## 2013-03-15 MED ORDER — PROPOFOL 10 MG/ML IV BOLUS
INTRAVENOUS | Status: DC | PRN
  Administered 2013-03-15: 180 mg via INTRAVENOUS

## 2013-03-15 MED ORDER — ALBUMIN HUMAN 5 % IV SOLN
INTRAVENOUS | Status: DC | PRN
  Administered 2013-03-15: 13:00:00 via INTRAVENOUS

## 2013-03-15 MED ORDER — SODIUM CHLORIDE 0.9 % IV SOLN
250.0000 mL | INTRAVENOUS | Status: DC
  Administered 2013-03-15: 10 mL/h via INTRAVENOUS
  Administered 2013-03-16: 250 mL via INTRAVENOUS

## 2013-03-15 MED ORDER — DEXMEDETOMIDINE HCL IN NACL 200 MCG/50ML IV SOLN
0.1000 ug/kg/h | INTRAVENOUS | Status: DC
  Administered 2013-03-15: 0.7 ug/kg/h via INTRAVENOUS
  Filled 2013-03-15: qty 50

## 2013-03-15 MED ORDER — PROPOFOL 10 MG/ML IV BOLUS
INTRAVENOUS | Status: AC
  Filled 2013-03-15: qty 20

## 2013-03-15 MED ORDER — " MILRINONE ADULT DOSE - USE ORDER SET": Status: DC | PRN

## 2013-03-15 MED ORDER — LEVALBUTEROL HCL 1.25 MG/0.5ML IN NEBU
1.2500 mg | INHALATION_SOLUTION | Freq: Four times a day (QID) | RESPIRATORY_TRACT | Status: DC
  Filled 2013-03-15 (×3): qty 0.5

## 2013-03-15 MED ORDER — SODIUM CHLORIDE 0.9 % IJ SOLN
3.0000 mL | Freq: Two times a day (BID) | INTRAMUSCULAR | Status: DC
  Administered 2013-03-16 – 2013-03-19 (×3): 3 mL via INTRAVENOUS

## 2013-03-15 MED ORDER — " MILRINONE ADULT DOSE - USE ORDER SET"
Status: DC | PRN
  Administered 2013-03-15: 7.8 mL/h via BUCCAL

## 2013-03-15 MED ORDER — ALBUMIN HUMAN 5 % IV SOLN
250.0000 mL | INTRAVENOUS | Status: AC | PRN
  Filled 2013-03-15: qty 250

## 2013-03-15 MED ORDER — METOPROLOL TARTRATE 12.5 MG HALF TABLET
12.5000 mg | ORAL_TABLET | Freq: Two times a day (BID) | ORAL | Status: DC
  Administered 2013-03-16 – 2013-03-19 (×5): 12.5 mg via ORAL
  Filled 2013-03-15 (×11): qty 1

## 2013-03-15 MED ORDER — FENTANYL CITRATE 0.05 MG/ML IJ SOLN
INTRAMUSCULAR | Status: DC | PRN
  Administered 2013-03-15: 250 ug via INTRAVENOUS
  Administered 2013-03-15: 50 ug via INTRAVENOUS
  Administered 2013-03-15: 200 ug via INTRAVENOUS
  Administered 2013-03-15 (×3): 250 ug via INTRAVENOUS

## 2013-03-15 MED ORDER — ASPIRIN EC 325 MG PO TBEC
325.0000 mg | DELAYED_RELEASE_TABLET | Freq: Every day | ORAL | Status: DC
  Administered 2013-03-16 – 2013-03-22 (×7): 325 mg via ORAL
  Filled 2013-03-15 (×7): qty 1

## 2013-03-15 MED ORDER — VANCOMYCIN HCL 1000 MG IV SOLR
1000.0000 mg | INTRAVENOUS | Status: DC
  Filled 2013-03-15: qty 1000

## 2013-03-15 MED ORDER — MILRINONE IN DEXTROSE 20 MG/100ML IV SOLN
0.0000 ug/kg/min | INTRAVENOUS | Status: DC
  Administered 2013-03-15: 0.3 ug/kg/min via INTRAVENOUS
  Administered 2013-03-16: 0.2 ug/kg/min via INTRAVENOUS
  Filled 2013-03-15 (×2): qty 100

## 2013-03-15 MED ORDER — MAGNESIUM SULFATE 40 MG/ML IJ SOLN
4.0000 g | Freq: Once | INTRAMUSCULAR | Status: DC
  Filled 2013-03-15: qty 100

## 2013-03-15 MED ORDER — DOPAMINE-DEXTROSE 3.2-5 MG/ML-% IV SOLN
INTRAVENOUS | Status: DC | PRN
  Administered 2013-03-15: 3 ug/kg/min via INTRAVENOUS

## 2013-03-15 MED ORDER — ASPIRIN 81 MG PO CHEW: 324.0000 mg | CHEWABLE_TABLET | Freq: Every day | ORAL | Status: DC

## 2013-03-15 MED ORDER — PHENYLEPHRINE HCL 10 MG/ML IJ SOLN
10.0000 mg | INTRAVENOUS | Status: DC | PRN
  Administered 2013-03-15: 10 ug/min via INTRAVENOUS

## 2013-03-15 MED ORDER — FAMOTIDINE IN NACL 20-0.9 MG/50ML-% IV SOLN
20.0000 mg | Freq: Two times a day (BID) | INTRAVENOUS | Status: AC
  Administered 2013-03-15 (×2): 20 mg via INTRAVENOUS
  Filled 2013-03-15: qty 50

## 2013-03-15 MED ORDER — BISACODYL 10 MG RE SUPP: 10.0000 mg | Freq: Every day | RECTAL | Status: DC

## 2013-03-15 MED ORDER — METHYLPREDNISOLONE SODIUM SUCC 125 MG IJ SOLR
INTRAMUSCULAR | Status: AC
  Filled 2013-03-15: qty 2

## 2013-03-15 MED ORDER — HEPARIN SODIUM (PORCINE) 1000 UNIT/ML IJ SOLN
INTRAMUSCULAR | Status: AC
  Filled 2013-03-15: qty 1

## 2013-03-15 MED ORDER — PROTAMINE SULFATE 10 MG/ML IV SOLN
INTRAVENOUS | Status: AC
  Filled 2013-03-15: qty 25

## 2013-03-15 MED ORDER — NITROGLYCERIN IN D5W 200-5 MCG/ML-% IV SOLN: 0.0000 ug/min | INTRAVENOUS | Status: DC

## 2013-03-15 MED ORDER — METOPROLOL TARTRATE 25 MG/10 ML ORAL SUSPENSION
12.5000 mg | Freq: Two times a day (BID) | ORAL | Status: DC
  Filled 2013-03-15 (×9): qty 5

## 2013-03-15 MED ORDER — STERILE WATER FOR INJECTION IJ SOLN
INTRAMUSCULAR | Status: AC
  Filled 2013-03-15: qty 20

## 2013-03-15 MED ORDER — SODIUM CHLORIDE 0.9 % IV SOLN
10.0000 g | INTRAVENOUS | Status: DC | PRN
  Administered 2013-03-15: 5 g/h via INTRAVENOUS

## 2013-03-15 SURGICAL SUPPLY — 115 items
ADAPTER CARDIO PERF ANTE/RETRO (ADAPTER) ×4 IMPLANT
APPLICATOR COTTON TIP 6IN STRL (MISCELLANEOUS) ×4 IMPLANT
ATTRACTOMAT 16X20 MAGNETIC DRP (DRAPES) ×4 IMPLANT
BAG DECANTER FOR FLEXI CONT (MISCELLANEOUS) ×4 IMPLANT
BANDAGE ELASTIC 4 VELCRO ST LF (GAUZE/BANDAGES/DRESSINGS) IMPLANT
BANDAGE ELASTIC 6 VELCRO ST LF (GAUZE/BANDAGES/DRESSINGS) IMPLANT
BANDAGE GAUZE ELAST BULKY 4 IN (GAUZE/BANDAGES/DRESSINGS) IMPLANT
BASKET HEART  (ORDER IN 25'S) (MISCELLANEOUS) ×1
BASKET HEART (ORDER IN 25'S) (MISCELLANEOUS) ×1
BASKET HEART (ORDER IN 25S) (MISCELLANEOUS) ×2 IMPLANT
BLADE STERNUM SYSTEM 6 (BLADE) ×4 IMPLANT
BLADE SURG 12 STRL SS (BLADE) ×4 IMPLANT
BLADE SURG 15 STRL LF DISP TIS (BLADE) ×2 IMPLANT
BLADE SURG 15 STRL SS (BLADE) ×2
BLADE SURG ROTATE 9660 (MISCELLANEOUS) IMPLANT
BOOT SUTURE AID YELLOW STND (SUTURE) ×4 IMPLANT
CANISTER SUCTION 2500CC (MISCELLANEOUS) ×4 IMPLANT
CANNULA GUNDRY RCSP 15FR (MISCELLANEOUS) ×4 IMPLANT
CANNULA VENOUS LOW PROF 32X40 (CANNULA) IMPLANT
CARDIAC SUCTION (MISCELLANEOUS) ×4 IMPLANT
CATH CPB KIT VANTRIGT (MISCELLANEOUS) ×4 IMPLANT
CATH HEART VENT LEFT (CATHETERS) ×2 IMPLANT
CATH RETROPLEGIA CORONARY 14FR (CATHETERS) IMPLANT
CATH ROBINSON RED A/P 18FR (CATHETERS) ×12 IMPLANT
CATH THORACIC 36FR RT ANG (CATHETERS) ×8 IMPLANT
CLIP FOGARTY SPRING 6M (CLIP) IMPLANT
CLIP TI WIDE RED SMALL 24 (CLIP) IMPLANT
COVER SURGICAL LIGHT HANDLE (MISCELLANEOUS) ×8 IMPLANT
CRADLE DONUT ADULT HEAD (MISCELLANEOUS) ×4 IMPLANT
DRAIN CHANNEL 32F RND 10.7 FF (WOUND CARE) ×4 IMPLANT
DRAPE CARDIOVASCULAR INCISE (DRAPES) ×2
DRAPE SLUSH/WARMER DISC (DRAPES) ×4 IMPLANT
DRAPE SRG 135X102X78XABS (DRAPES) ×2 IMPLANT
DRSG AQUACEL AG ADV 3.5X14 (GAUZE/BANDAGES/DRESSINGS) ×4 IMPLANT
ELECT BLADE 4.0 EZ CLEAN MEGAD (MISCELLANEOUS) ×4
ELECT BLADE 6.5 EXT (BLADE) ×4 IMPLANT
ELECT CAUTERY BLADE 6.4 (BLADE) ×4 IMPLANT
ELECT REM PT RETURN 9FT ADLT (ELECTROSURGICAL) ×8
ELECTRODE BLDE 4.0 EZ CLN MEGD (MISCELLANEOUS) ×2 IMPLANT
ELECTRODE REM PT RTRN 9FT ADLT (ELECTROSURGICAL) ×4 IMPLANT
GLOVE BIO SURGEON STRL SZ 6 (GLOVE) ×4 IMPLANT
GLOVE BIO SURGEON STRL SZ7.5 (GLOVE) ×12 IMPLANT
GLOVE BIOGEL PI IND STRL 6 (GLOVE) ×4 IMPLANT
GLOVE BIOGEL PI IND STRL 6.5 (GLOVE) ×8 IMPLANT
GLOVE BIOGEL PI IND STRL 7.0 (GLOVE) ×12 IMPLANT
GLOVE BIOGEL PI INDICATOR 6 (GLOVE) ×4
GLOVE BIOGEL PI INDICATOR 6.5 (GLOVE) ×8
GLOVE BIOGEL PI INDICATOR 7.0 (GLOVE) ×12
GOWN STRL NON-REIN LRG LVL3 (GOWN DISPOSABLE) ×16 IMPLANT
HEMOSTAT POWDER SURGIFOAM 1G (HEMOSTASIS) ×12 IMPLANT
HEMOSTAT SURGICEL 2X14 (HEMOSTASIS) ×4 IMPLANT
INSERT FOGARTY XLG (MISCELLANEOUS) IMPLANT
KIT BASIN OR (CUSTOM PROCEDURE TRAY) ×4 IMPLANT
KIT ROOM TURNOVER OR (KITS) ×4 IMPLANT
KIT SUCTION CATH 14FR (SUCTIONS) ×4 IMPLANT
KIT VASOVIEW W/TROCAR VH 2000 (KITS) ×4 IMPLANT
LEAD PACING MYOCARDI (MISCELLANEOUS) ×4 IMPLANT
LINE VENT (MISCELLANEOUS) ×4 IMPLANT
MARKER GRAFT CORONARY BYPASS (MISCELLANEOUS) ×12 IMPLANT
NS IRRIG 1000ML POUR BTL (IV SOLUTION) ×24 IMPLANT
PACK OPEN HEART (CUSTOM PROCEDURE TRAY) ×4 IMPLANT
PAD ARMBOARD 7.5X6 YLW CONV (MISCELLANEOUS) ×8 IMPLANT
PAD ELECT DEFIB RADIOL ZOLL (MISCELLANEOUS) ×4 IMPLANT
PENCIL BUTTON HOLSTER BLD 10FT (ELECTRODE) ×4 IMPLANT
PUNCH AORTIC ROTATE 4.0MM (MISCELLANEOUS) IMPLANT
PUNCH AORTIC ROTATE 4.5MM 8IN (MISCELLANEOUS) IMPLANT
PUNCH AORTIC ROTATE 5MM 8IN (MISCELLANEOUS) IMPLANT
SET CARDIOPLEGIA MPS 5001102 (MISCELLANEOUS) ×4 IMPLANT
SPONGE GAUZE 4X4 12PLY (GAUZE/BANDAGES/DRESSINGS) ×4 IMPLANT
SPONGE LAP 18X18 X RAY DECT (DISPOSABLE) ×4 IMPLANT
SPONGE LAP 4X18 X RAY DECT (DISPOSABLE) ×4 IMPLANT
SURGIFLO W/THROMBIN 8M KIT (HEMOSTASIS) ×4 IMPLANT
SUT BONE WAX W31G (SUTURE) ×4 IMPLANT
SUT ETHIBON 2 0 V 52N 30 (SUTURE) ×8 IMPLANT
SUT ETHIBOND 2 0 SH (SUTURE) ×6
SUT ETHIBOND 2 0 SH 36X2 (SUTURE) ×6 IMPLANT
SUT ETHIBOND V-5 VALVE (SUTURE) ×8 IMPLANT
SUT MNCRL AB 4-0 PS2 18 (SUTURE) IMPLANT
SUT PROLENE 3 0 RB 1 (SUTURE) ×4 IMPLANT
SUT PROLENE 3 0 SH 1 (SUTURE) IMPLANT
SUT PROLENE 3 0 SH DA (SUTURE) IMPLANT
SUT PROLENE 3 0 SH1 36 (SUTURE) IMPLANT
SUT PROLENE 4 0 RB 1 (SUTURE) ×12
SUT PROLENE 4 0 SH DA (SUTURE) ×12 IMPLANT
SUT PROLENE 4-0 RB1 .5 CRCL 36 (SUTURE) ×12 IMPLANT
SUT PROLENE 5 0 C 1 36 (SUTURE) IMPLANT
SUT PROLENE 6 0 C 1 30 (SUTURE) ×16 IMPLANT
SUT PROLENE 6 0 CC (SUTURE) ×12 IMPLANT
SUT PROLENE 8 0 BV175 6 (SUTURE) IMPLANT
SUT PROLENE BLUE 7 0 (SUTURE) ×4 IMPLANT
SUT SILK  1 MH (SUTURE)
SUT SILK 1 MH (SUTURE) IMPLANT
SUT SILK 2 0 SH CR/8 (SUTURE) IMPLANT
SUT SILK 3 0 SH CR/8 (SUTURE) IMPLANT
SUT STEEL 6MS V (SUTURE) ×8 IMPLANT
SUT STEEL SZ 6 DBL 3X14 BALL (SUTURE) ×4 IMPLANT
SUT VIC AB 1 CTX 36 (SUTURE) ×8
SUT VIC AB 1 CTX36XBRD ANBCTR (SUTURE) ×8 IMPLANT
SUT VIC AB 2-0 CT1 27 (SUTURE)
SUT VIC AB 2-0 CT1 TAPERPNT 27 (SUTURE) IMPLANT
SUT VIC AB 2-0 CTX 27 (SUTURE) IMPLANT
SUT VIC AB 3-0 X1 27 (SUTURE) IMPLANT
SUTURE E-PAK OPEN HEART (SUTURE) ×4 IMPLANT
SYR 10ML KIT SKIN ADHESIVE (MISCELLANEOUS) IMPLANT
SYSTEM SAHARA CHEST DRAIN ATS (WOUND CARE) ×4 IMPLANT
TAPE CLOTH SURG 4X10 WHT LF (GAUZE/BANDAGES/DRESSINGS) ×4 IMPLANT
TOWEL OR 17X24 6PK STRL BLUE (TOWEL DISPOSABLE) ×8 IMPLANT
TOWEL OR 17X26 10 PK STRL BLUE (TOWEL DISPOSABLE) ×8 IMPLANT
TRAY FOLEY IC TEMP SENS 14FR (CATHETERS) ×4 IMPLANT
TUBING INSUFFLATION 10FT LAP (TUBING) ×4 IMPLANT
UNDERPAD 30X30 INCONTINENT (UNDERPADS AND DIAPERS) ×4 IMPLANT
VALVE AORTIC TOP HAT (Prosthesis & Implant Heart) ×2 IMPLANT
VALVE AORTIC TOP HAT 21 (Prosthesis & Implant Heart) ×2 IMPLANT
VENT LEFT HEART 12002 (CATHETERS) ×4
WATER STERILE IRR 1000ML POUR (IV SOLUTION) ×8 IMPLANT

## 2013-03-15 NOTE — Anesthesia Preprocedure Evaluation (Signed)
Anesthesia Evaluation  Patient identified by MRN, date of birth, ID band Patient awake    Reviewed: Allergy & Precautions, H&P , NPO status , Patient's Chart, lab work & pertinent test results  Airway Mallampati: II  Neck ROM: full    Dental   Pulmonary asthma , COPDCurrent Smoker,          Cardiovascular hypertension, + CAD and + Peripheral Vascular Disease     Neuro/Psych  Headaches, Anxiety Depression  Neuromuscular disease    GI/Hepatic GERD-  ,(+) Hepatitis -, CS/p liver transplant. Normal LFTs currently.   Endo/Other  diabetes, Type 2Hypothyroidism   Renal/GU ESRF and DialysisRenal disease     Musculoskeletal  (+) Arthritis -,   Abdominal   Peds  Hematology  (+) anemia ,   Anesthesia Other Findings   Reproductive/Obstetrics                           Anesthesia Physical Anesthesia Plan  ASA: IV  Anesthesia Plan: General   Post-op Pain Management:    Induction: Intravenous  Airway Management Planned: Oral ETT  Additional Equipment: Arterial line, CVP, PA Cath, Ultrasound Guidance Line Placement and TEE  Intra-op Plan:   Post-operative Plan: Post-operative intubation/ventilation  Informed Consent: I have reviewed the patients History and Physical, chart, labs and discussed the procedure including the risks, benefits and alternatives for the proposed anesthesia with the patient or authorized representative who has indicated his/her understanding and acceptance.     Plan Discussed with: CRNA, Anesthesiologist and Surgeon  Anesthesia Plan Comments:         Anesthesia Quick Evaluation

## 2013-03-15 NOTE — OR Nursing (Signed)
2nd call to SICU [id number]

## 2013-03-15 NOTE — Progress Notes (Signed)
Subjective:  S/p heart surgery, on vent  Objective: Vital signs in last 24 hours: Temp:  [95.4 F (35.2 C)-98.3 F (36.8 C)] 95.4 F (35.2 C) (02/03 1515) Pulse Rate:  [65-85] 85 (02/03 1515) Resp:  [16-19] 19 (02/03 1515) BP: (101-159)/(57-76) 159/71 mmHg (02/02 2359) SpO2:  [99 %-100 %] 100 % (02/03 1515) FiO2 (%):  [50 %] 50 % (02/03 1442) Weight:  [74.4 kg (164 lb 0.4 oz)] 74.4 kg (164 lb 0.4 oz) (02/02 1805) Weight change:   Intake/Output from previous day: 02/02 0701 - 02/03 0700 In: 600 [P.O.:600] Out: 3500  Intake/Output this shift: Total I/O In: 2316 [I.V.:1500; Blood:566; IV Piggyback:250] Out: -   Lab Results:  Recent Labs  03/15/13 0300  03/15/13 1150  03/15/13 1302 03/15/13 1400  WBC 3.9*  --   --   --   --  9.0  HGB 9.8*  < > 8.6*  < > 9.2* 10.8*  HCT 28.6*  < > 24.8*  < > 27.0* 30.8*  PLT 79*  --  48*  --   --  100*  < > = values in this interval not displayed. BMET:  Recent Labs  03/13/13 0544 03/14/13 1411 03/15/13 0300  03/15/13 1156 03/15/13 1302  NA 136* 129* 137  < > 137 139  K 5.0 4.9 4.4  < > 5.4* 4.7  CL 93* 87* 93*  --   --   --   CO2 28 23 27   --   --   --   GLUCOSE 179* 154* 130*  < > 93 180*  BUN 28* 44* 23  --   --   --   CREATININE 6.58* 9.26* 5.74*  --   --   --   CALCIUM 8.4 8.6 8.8  --   --   --   ALBUMIN 2.7* 2.8*  --   --   --   --   < > = values in this interval not displayed. No results found for this basename: PTH,  in the last 72 hours Iron Studies: No results found for this basename: IRON, TIBC, TRANSFERRIN, FERRITIN,  in the last 72 hours  EXAM:  General appearance:  On ventilator s/p surgery, facial and neck swelling Resp: CTA without rales, rhonchi, or wheezes  Cardio: RRR with Gr II/VI systolic murmur, no rub  GI: + BS, soft  Extremities: No lower extremity edema  Access: L thigh AVG with + bruit   Dialysis: TTS [place]  4h 74kg 2K/2.25 [place] L thigh AVG No heparin  Hect 4 EPO 1000 Venofer 50  mg/wk  Assessment/Plan: 1. Severe aortic valve stenosis/CAD - aortic valve replacement & CABG x 1 today per Dr. Prescott Gum, stable post-surgery. 2. ESRD - HD on TTS @ [facility], K 4.7, last HD yesterday off schedule.  Next HD likely tomorrow. 3. HTN/Volume - BP stable post-surgery; wt 74.4 kg with EDW 74 s/p net UF 3.5 L yesterday.  4. Anemia - Hgb 11.6, stable.  5. 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  6. DM - insulin per primary.  7. Liver transplant - on Prograf     LOS: 7 days   [name] 03/15/2013,3:53 PM

## 2013-03-15 NOTE — Progress Notes (Signed)
CT surgery p.m. Rounds  Status post aVR CABG today Resting on ventilator Patient remains narcotize, she is in chronic renal failure on hemodialysis will be kept intubated overnight Hemodynamic stable chest tube output minimal Maintaining sinus rhythm

## 2013-03-15 NOTE — Progress Notes (Signed)
I have seen and examined this patient and agree with the plan of care   [facility] W 03/15/2013, 5:13 PM

## 2013-03-15 NOTE — Brief Op Note (Signed)
03/08/2013 - 03/15/2013  12:24 PM  PATIENT:  [name]  58 y.o. female  PRE-OPERATIVE DIAGNOSIS:  Severe AS, CAD  POST-OPERATIVE DIAGNOSIS:  Severe AS, CAD  PROCEDURE:   AORTIC VALVE REPLACEMENT (21 mm Carbomedics Top Hat mechanical prosthesis) CORONARY ARTERY BYPASS GRAFTING x 1 (LIMA-LAD)  SURGEON:  Ivin Poot, MD  ASSISTANT: Suzzanne Cloud, PA-C  ANESTHESIA:   general  PATIENT CONDITION:  ICU - intubated and hemodynamically stable.  PRE-OPERATIVE WEIGHT: 74 kg

## 2013-03-15 NOTE — OR Nursing (Signed)
1st call to SICU 

## 2013-03-15 NOTE — Progress Notes (Signed)
The patient was examined and preop studies reviewed. There has been no change from the prior exam and the patient is ready for surgery.  Plan AVR CABG on L [place] today

## 2013-03-15 NOTE — Anesthesia Procedure Notes (Signed)
Procedure Name: Intubation Date/Time: 03/15/2013 7:48 AM Performed by: Ollen Bowl Pre-anesthesia Checklist: Patient identified, Timeout performed, Emergency Drugs available, Suction available and Patient being monitored Patient Re-evaluated:Patient Re-evaluated prior to inductionOxygen Delivery Method: Circle system utilized and Simple face mask Preoxygenation: Pre-oxygenation with 100% oxygen Intubation Type: IV induction Ventilation: Mask ventilation without difficulty Laryngoscope Size: Miller and 2 Grade View: Grade I Tube type: Oral Tube size: 8.0 mm Number of attempts: 1 Airway Equipment and Method: Patient positioned with wedge pillow and Stylet Placement Confirmation: ETT inserted through vocal cords under direct vision,  positive ETCO2 and breath sounds checked- equal and bilateral Secured at: 22 cm Tube secured with: Tape Dental Injury: Teeth and Oropharynx as per pre-operative assessment

## 2013-03-15 NOTE — Transfer of Care (Signed)
Immediate Anesthesia Transfer of Care Note  Patient: [name]  Procedure(s) Performed: Procedure(s) with comments: AORTIC VALVE REPLACEMENT (AVR) (N/A) CORONARY ARTERY BYPASS GRAFTING (CABG) times one using left internal mammary artery. (N/A) - POSS CABG X 1 INTRAOPERATIVE TRANSESOPHAGEAL ECHOCARDIOGRAM (N/A)  Patient Location: PACU and SICU  Anesthesia Type:General  Level of Consciousness: Patient remains intubated per anesthesia plan  Airway & Oxygen Therapy: Patient remains intubated per anesthesia plan  Post-op Assessment: Report given to PACU RN and Post -op Vital signs reviewed and stable  Post vital signs: Reviewed and stable  Complications: No apparent anesthesia complications

## 2013-03-15 NOTE — Progress Notes (Signed)
Echocardiogram Echocardiogram Transesophageal has been performed.  Joelene Millin 03/15/2013, 8:10 AM

## 2013-03-15 NOTE — OR Nursing (Signed)
[facility] called to notify family off bypass.

## 2013-03-16 ENCOUNTER — Inpatient Hospital Stay (HOSPITAL_COMMUNITY): Payer: Medicare Other

## 2013-03-16 ENCOUNTER — Telehealth (HOSPITAL_COMMUNITY): Payer: Self-pay | Admitting: *Deleted

## 2013-03-16 LAB — POCT I-STAT 3, ART BLOOD GAS (G3+)
ACID-BASE DEFICIT: 4 mmol/L — AB (ref 0.0–2.0)
Acid-base deficit: 6 mmol/L — ABNORMAL HIGH (ref 0.0–2.0)
Acid-base deficit: 6 mmol/L — ABNORMAL HIGH (ref 0.0–2.0)
BICARBONATE: 20.5 meq/L (ref 20.0–24.0)
Bicarbonate: 21 mEq/L (ref 20.0–24.0)
Bicarbonate: 20.7 mEq/L (ref 20.0–24.0)
O2 SAT: 99 %
O2 Saturation: 99 %
O2 Saturation: 99 %
PCO2 ART: 42.6 mmHg (ref 35.0–45.0)
PH ART: 7.295 — AB (ref 7.350–7.450)
PO2 ART: 141 mmHg — AB (ref 80.0–100.0)
Patient temperature: 37
TCO2: 22 mmol/L (ref 0–100)
TCO2: 22 mmol/L (ref 0–100)
TCO2: 22 mmol/L (ref 0–100)
pCO2 arterial: 36 mmHg (ref 35.0–45.0)
pCO2 arterial: 41.2 mmHg (ref 35.0–45.0)
pH, Arterial: 7.375 (ref 7.350–7.450)
pH, Arterial: 7.304 — ABNORMAL LOW (ref 7.350–7.450)
pO2, Arterial: 146 mmHg — ABNORMAL HIGH (ref 80.0–100.0)
pO2, Arterial: 147 mmHg — ABNORMAL HIGH (ref 80.0–100.0)

## 2013-03-16 LAB — CBC
HCT: 28.7 % — ABNORMAL LOW (ref 36.0–46.0)
HEMATOCRIT: 25.7 % — AB (ref 36.0–46.0)
Hemoglobin: 9.9 g/dL — ABNORMAL LOW (ref 12.0–15.0)
Hemoglobin: 8.8 g/dL — ABNORMAL LOW (ref 12.0–15.0)
MCH: 31.2 pg (ref 26.0–34.0)
MCH: 31.3 pg (ref 26.0–34.0)
MCHC: 34.5 g/dL (ref 30.0–36.0)
MCHC: 34.2 g/dL (ref 30.0–36.0)
MCV: 90.5 fL (ref 78.0–100.0)
MCV: 91.5 fL (ref 78.0–100.0)
PLATELETS: 144 10*3/uL — AB (ref 150–400)
Platelets: 197 10*3/uL (ref 150–400)
RBC: 3.17 MIL/uL — AB (ref 3.87–5.11)
RBC: 2.81 MIL/uL — ABNORMAL LOW (ref 3.87–5.11)
RDW: 15.8 % — ABNORMAL HIGH (ref 11.5–15.5)
RDW: 16 % — AB (ref 11.5–15.5)
WBC: 16.1 10*3/uL — ABNORMAL HIGH (ref 4.0–10.5)
WBC: 13.9 10*3/uL — AB (ref 4.0–10.5)

## 2013-03-16 LAB — GLUCOSE, CAPILLARY
GLUCOSE-CAPILLARY: 106 mg/dL — AB (ref 70–99)
GLUCOSE-CAPILLARY: 108 mg/dL — AB (ref 70–99)
GLUCOSE-CAPILLARY: 109 mg/dL — AB (ref 70–99)
GLUCOSE-CAPILLARY: 109 mg/dL — AB (ref 70–99)
GLUCOSE-CAPILLARY: 109 mg/dL — AB (ref 70–99)
GLUCOSE-CAPILLARY: 117 mg/dL — AB (ref 70–99)
GLUCOSE-CAPILLARY: 120 mg/dL — AB (ref 70–99)
GLUCOSE-CAPILLARY: 121 mg/dL — AB (ref 70–99)
GLUCOSE-CAPILLARY: 158 mg/dL — AB (ref 70–99)
GLUCOSE-CAPILLARY: 159 mg/dL — AB (ref 70–99)
GLUCOSE-CAPILLARY: 171 mg/dL — AB (ref 70–99)
GLUCOSE-CAPILLARY: 93 mg/dL (ref 70–99)
GLUCOSE-CAPILLARY: 95 mg/dL (ref 70–99)
Glucose-Capillary: 126 mg/dL — ABNORMAL HIGH (ref 70–99)
Glucose-Capillary: 112 mg/dL — ABNORMAL HIGH (ref 70–99)
Glucose-Capillary: 111 mg/dL — ABNORMAL HIGH (ref 70–99)
Glucose-Capillary: 120 mg/dL — ABNORMAL HIGH (ref 70–99)
Glucose-Capillary: 101 mg/dL — ABNORMAL HIGH (ref 70–99)
Glucose-Capillary: 115 mg/dL — ABNORMAL HIGH (ref 70–99)
Glucose-Capillary: 111 mg/dL — ABNORMAL HIGH (ref 70–99)
Glucose-Capillary: 101 mg/dL — ABNORMAL HIGH (ref 70–99)
Glucose-Capillary: 105 mg/dL — ABNORMAL HIGH (ref 70–99)

## 2013-03-16 LAB — PREPARE PLATELET PHERESIS
UNIT DIVISION: 0
Unit division: 0

## 2013-03-16 LAB — BASIC METABOLIC PANEL
BUN: 32 mg/dL — AB (ref 6–23)
BUN: 33 mg/dL — ABNORMAL HIGH (ref 6–23)
CALCIUM: 8.5 mg/dL (ref 8.4–10.5)
CHLORIDE: 100 meq/L (ref 96–112)
CO2: 19 meq/L (ref 19–32)
CO2: 19 mEq/L (ref 19–32)
CREATININE: 6.03 mg/dL — AB (ref 0.50–1.10)
Calcium: 8.5 mg/dL (ref 8.4–10.5)
Chloride: 100 mEq/L (ref 96–112)
Creatinine, Ser: 6.32 mg/dL — ABNORMAL HIGH (ref 0.50–1.10)
GFR calc Af Amer: 8 mL/min — ABNORMAL LOW (ref >90)
GFR calc Af Amer: 8 mL/min — ABNORMAL LOW (ref >90)
GFR calc non Af Amer: 7 mL/min — ABNORMAL LOW (ref >90)
GFR calc non Af Amer: 7 mL/min — ABNORMAL LOW (ref >90)
Glucose, Bld: 123 mg/dL — ABNORMAL HIGH (ref 70–99)
Glucose, Bld: 149 mg/dL — ABNORMAL HIGH (ref 70–99)
Potassium: 6.3 mEq/L — ABNORMAL HIGH (ref 3.7–5.3)
Potassium: 6.2 mEq/L — ABNORMAL HIGH (ref 3.7–5.3)
Sodium: 135 mEq/L — ABNORMAL LOW (ref 137–147)
Sodium: 136 mEq/L — ABNORMAL LOW (ref 137–147)

## 2013-03-16 LAB — POCT I-STAT, CHEM 8
BUN: 9 mg/dL (ref 6–23)
CALCIUM ION: 1.16 mmol/L (ref 1.12–1.23)
CHLORIDE: 99 meq/L (ref 96–112)
Creatinine, Ser: 3 mg/dL — ABNORMAL HIGH (ref 0.50–1.10)
Glucose, Bld: 145 mg/dL — ABNORMAL HIGH (ref 70–99)
HCT: 26 % — ABNORMAL LOW (ref 36.0–46.0)
Hemoglobin: 8.8 g/dL — ABNORMAL LOW (ref 12.0–15.0)
POTASSIUM: 3.7 meq/L (ref 3.7–5.3)
SODIUM: 136 meq/L — AB (ref 137–147)
TCO2: 26 mmol/L (ref 0–100)

## 2013-03-16 LAB — MAGNESIUM
Magnesium: 2 mg/dL (ref 1.5–2.5)
Magnesium: 1.9 mg/dL (ref 1.5–2.5)

## 2013-03-16 LAB — HEPATIC FUNCTION PANEL
ALT: 42 U/L — ABNORMAL HIGH (ref 0–35)
AST: 110 U/L — ABNORMAL HIGH (ref 0–37)
Albumin: 2.7 g/dL — ABNORMAL LOW (ref 3.5–5.2)
Alkaline Phosphatase: 64 U/L (ref 39–117)
Bilirubin, Direct: <0.2 mg/dL (ref 0.0–0.3)
Total Bilirubin: 0.4 mg/dL (ref 0.3–1.2)
Total Protein: 5.6 g/dL — ABNORMAL LOW (ref 6.0–8.3)

## 2013-03-16 LAB — POTASSIUM: Potassium: 5.8 mEq/L — ABNORMAL HIGH (ref 3.7–5.3)

## 2013-03-16 LAB — CREATININE, SERUM
Creatinine, Ser: 2.2 mg/dL — ABNORMAL HIGH (ref 0.50–1.10)
GFR calc non Af Amer: 23 mL/min — ABNORMAL LOW (ref >90)
GFR, EST AFRICAN AMERICAN: 27 mL/min — AB (ref >90)

## 2013-03-16 LAB — PROTIME-INR
INR: 1 (ref 0.00–1.49)
Prothrombin Time: 13 seconds (ref 11.6–15.2)

## 2013-03-16 MED ORDER — DOPAMINE-DEXTROSE 3.2-5 MG/ML-% IV SOLN
0.0000 ug/kg/min | INTRAVENOUS | Status: DC
Start: 2013-03-16 — End: 2013-03-20
  Administered 2013-03-17: 5 ug/kg/min via INTRAVENOUS
  Administered 2013-03-19: 3 ug/kg/min via INTRAVENOUS
  Filled 2013-03-16 (×2): qty 250

## 2013-03-16 MED ORDER — LIDOCAINE-PRILOCAINE 2.5-2.5 % EX CREA: 1.0000 "application " | TOPICAL_CREAM | CUTANEOUS | Status: DC | PRN

## 2013-03-16 MED ORDER — NEPRO/CARBSTEADY PO LIQD
237.0000 mL | ORAL | Status: DC | PRN
  Administered 2013-03-20 (×2): 237 mL via ORAL
  Filled 2013-03-16: qty 237

## 2013-03-16 MED ORDER — SODIUM CHLORIDE 0.9 % IV SOLN: 100.0000 mL | INTRAVENOUS | Status: DC | PRN

## 2013-03-16 MED ORDER — SODIUM POLYSTYRENE SULFONATE 15 GM/60ML PO SUSP
30.0000 g | Freq: Once | ORAL | Status: AC
  Administered 2013-03-16: 30 g
  Filled 2013-03-16: qty 120

## 2013-03-16 MED ORDER — CALCIUM CHLORIDE 10 % IV SOLN
1.0000 g | Freq: Once | INTRAVENOUS | Status: AC
  Administered 2013-03-16: 1 g via INTRAVENOUS
  Filled 2013-03-16: qty 10

## 2013-03-16 MED ORDER — SODIUM CHLORIDE 0.9 % IV SOLN
1.0000 g | Freq: Once | INTRAVENOUS | Status: DC
  Filled 2013-03-16: qty 10

## 2013-03-16 MED ORDER — INSULIN ASPART 100 UNIT/ML ~~LOC~~ SOLN
10.0000 [IU] | Freq: Once | SUBCUTANEOUS | Status: AC
  Administered 2013-03-16: 10 [IU] via SUBCUTANEOUS

## 2013-03-16 MED ORDER — INSULIN DETEMIR 100 UNIT/ML ~~LOC~~ SOLN
6.0000 [IU] | Freq: Two times a day (BID) | SUBCUTANEOUS | Status: DC
  Administered 2013-03-16 – 2013-03-17 (×3): 6 [IU] via SUBCUTANEOUS
  Filled 2013-03-16 (×4): qty 0.06

## 2013-03-16 MED ORDER — LIDOCAINE HCL (PF) 1 % IJ SOLN: 5.0000 mL | INTRAMUSCULAR | Status: DC | PRN

## 2013-03-16 MED ORDER — INSULIN ASPART 100 UNIT/ML ~~LOC~~ SOLN
0.0000 [IU] | SUBCUTANEOUS | Status: DC
  Administered 2013-03-16: 2 [IU] via SUBCUTANEOUS
  Administered 2013-03-17: 4 [IU] via SUBCUTANEOUS
  Administered 2013-03-17: 2 [IU] via SUBCUTANEOUS
  Administered 2013-03-17: 4 [IU] via SUBCUTANEOUS
  Administered 2013-03-17: 2 [IU] via SUBCUTANEOUS
  Administered 2013-03-18: 4 [IU] via SUBCUTANEOUS
  Administered 2013-03-18 – 2013-03-19 (×5): 2 [IU] via SUBCUTANEOUS

## 2013-03-16 MED ORDER — CALCIUM CHLORIDE 10 % IV SOLN: 1.0000 g | Freq: Once | INTRAVENOUS | Status: DC

## 2013-03-16 MED ORDER — WARFARIN - PHYSICIAN DOSING INPATIENT
Freq: Every day | Status: DC
  Administered 2013-03-17 – 2013-03-22 (×2): 1
  Administered 2013-03-23 – 2013-03-24 (×2)

## 2013-03-16 MED ORDER — PENTAFLUOROPROP-TETRAFLUOROETH EX AERO: 1.0000 "application " | INHALATION_SPRAY | CUTANEOUS | Status: DC | PRN

## 2013-03-16 MED ORDER — ALTEPLASE 2 MG IJ SOLR
2.0000 mg | Freq: Once | INTRAMUSCULAR | Status: AC | PRN
  Filled 2013-03-16: qty 2

## 2013-03-16 MED ORDER — HEPARIN SODIUM (PORCINE) 1000 UNIT/ML DIALYSIS: 1000.0000 [IU] | INTRAMUSCULAR | Status: DC | PRN

## 2013-03-16 MED ORDER — DOXERCALCIFEROL 4 MCG/2ML IV SOLN
INTRAVENOUS | Status: AC
  Administered 2013-03-16: 4 ug
  Filled 2013-03-16: qty 2

## 2013-03-16 MED ORDER — DEXTROSE 50 % IV SOLN
25.0000 mL | Freq: Once | INTRAVENOUS | Status: AC
  Administered 2013-03-16: 25 mL via INTRAVENOUS
  Filled 2013-03-16: qty 50

## 2013-03-16 MED ORDER — WARFARIN SODIUM 2 MG PO TABS
2.0000 mg | ORAL_TABLET | Freq: Every day | ORAL | Status: DC
  Administered 2013-03-17: 2 mg via ORAL
  Filled 2013-03-16 (×2): qty 1

## 2013-03-16 MED ORDER — VANCOMYCIN HCL IN DEXTROSE 750-5 MG/150ML-% IV SOLN
750.0000 mg | Freq: Once | INTRAVENOUS | Status: AC
Start: 2013-03-16 — End: 2013-03-16
  Administered 2013-03-16: 750 mg via INTRAVENOUS
  Filled 2013-03-16: qty 150

## 2013-03-16 MED ORDER — ALBUMIN HUMAN 5 % IV SOLN
12.5000 g | Freq: Once | INTRAVENOUS | Status: AC
  Administered 2013-03-16: 12.5 g via INTRAVENOUS

## 2013-03-16 MED FILL — Lidocaine HCl IV Inj 20 MG/ML: INTRAVENOUS | Qty: 5 | Status: AC

## 2013-03-16 MED FILL — Heparin Sodium (Porcine) Inj 1000 Unit/ML: INTRAMUSCULAR | Qty: 30 | Status: AC

## 2013-03-16 MED FILL — Sodium Chloride IV Soln 0.9%: INTRAVENOUS | Qty: 4000 | Status: AC

## 2013-03-16 MED FILL — Heparin Sodium (Porcine) Inj 1000 Unit/ML: INTRAMUSCULAR | Qty: 10 | Status: AC

## 2013-03-16 MED FILL — Calcium Chloride Inj 10%: INTRAVENOUS | Qty: 10 | Status: AC

## 2013-03-16 MED FILL — Electrolyte-R (PH 7.4) Solution: INTRAVENOUS | Qty: 3000 | Status: AC

## 2013-03-16 MED FILL — Sodium Bicarbonate IV Soln 8.4%: INTRAVENOUS | Qty: 50 | Status: AC

## 2013-03-16 MED FILL — Magnesium Sulfate Inj 50%: INTRAMUSCULAR | Qty: 10 | Status: AC

## 2013-03-16 MED FILL — Potassium Chloride Inj 2 mEq/ML: INTRAVENOUS | Qty: 40 | Status: AC

## 2013-03-16 MED FILL — Dexmedetomidine HCl IV Soln 200 MCG/2ML: INTRAVENOUS | Qty: 2 | Status: AC

## 2013-03-16 NOTE — Progress Notes (Signed)
1 Day Post-Op Procedure(s) (LRB): AORTIC VALVE REPLACEMENT (AVR) (N/A) CORONARY ARTERY BYPASS GRAFTING (CABG) times one using left internal mammary artery. (N/A) INTRAOPERATIVE TRANSESOPHAGEAL ECHOCARDIOGRAM (N/A) Subjective: POD #1 AVR CABG for AS, CAD carbomedics mechanical valve Extubated , hemodynamics stable CRF on HD- K > 6.0-- HD today S/p liver tx 2011  Objective: Vital signs in last 24 hours: Temp:  [95.2 F (35.1 C)-99.3 F (37.4 C)] 98.2 F (36.8 C) (02/04 0940) Pulse Rate:  [83-119] 99 (02/04 0940) Cardiac Rhythm:  [-] Normal sinus rhythm (02/04 0800) Resp:  [12-25] 15 (02/04 0940) BP: (86-135)/(44-67) 109/56 mmHg (02/04 0900) SpO2:  [100 %] 100 % (02/04 0940) FiO2 (%):  [40 %-50 %] 40 % (02/04 0825) Weight:  [164 lb 0.4 oz (74.4 kg)-185 lb 13.6 oz (84.3 kg)] 185 lb 13.6 oz (84.3 kg) (02/04 0600)  Hemodynamic parameters for last 24 hours: PAP: (21-42)/(12-24) 28/18 mmHg CO:  [4.4 L/min-6 L/min] 5.7 L/min CI:  [2.5 L/min/m2-3.3 L/min/m2] 3.2 L/min/m2  Intake/Output from previous day: 02/03 0701 - 02/04 0700 In: 3915.3 [I.V.:2919.3; Blood:566; NG/GT:30; IV Piggyback:400] Out: 565 [Emesis/NG output:105; Chest Tube:460] Intake/Output this shift: Total I/O In: 154.2 [I.V.:154.2] Out: 125 [Chest Tube:125]  Comfortable Lungs clear AVR with click Mild edema  Lab Results:  Recent Labs  03/15/13 2045 03/15/13 2131 03/16/13 0410  WBC 10.8*  --  16.1*  HGB 9.4* 9.5* 9.9*  HCT 26.6* 28.0* 28.7*  PLT 171  --  197   BMET:  Recent Labs  03/16/13 0410 03/16/13 0650  NA 135* 136*  K 6.3* 6.2*  CL 100 100  CO2 19 19  GLUCOSE 123* 149*  BUN 32* 33*  CREATININE 6.03* 6.32*  CALCIUM 8.5 8.5    PT/INR:  Recent Labs  03/16/13 0410  LABPROT 13.0  INR 1.00   ABG    Component Value Date/Time   PHART 7.304* 03/16/2013 0912   HCO3 20.5 03/16/2013 0912   TCO2 22 03/16/2013 0912   ACIDBASEDEF 6.0* 03/16/2013 0912   O2SAT 99.0 03/16/2013 0912   CBG (last 3)    Recent Labs  03/16/13 0315 03/16/13 0409 03/16/13 0525  GLUCAP 117* 115* 111*    Assessment/Plan: S/P Procedure(s) (LRB): AORTIC VALVE REPLACEMENT (AVR) (N/A) CORONARY ARTERY BYPASS GRAFTING (CABG) times one using left internal mammary artery. (N/A) INTRAOPERATIVE TRANSESOPHAGEAL ECHOCARDIOGRAM (N/A) See progression orders HD for elevated K+ today  LOS: 8 days    VAN TRIGT III,PETER 03/16/2013

## 2013-03-16 NOTE — Procedures (Signed)
Extubation Procedure Note  Patient Details:   Name: [name] DOB: 09-01-1955 MRN: [id number]   Airway Documentation:   NIF -20, VC 580.  Per RN, Dr. Lawson Fiscal wants pt extubated.   Evaluation  O2 sats: stable throughout Complications: No apparent complications Patient did tolerate procedure well. Bilateral Breath Sounds:  (slight coarse) Suctioning: Airway Yes pt able to speak.  NO distress noted.  No stridor noted.   Lenna Sciara 03/16/2013, 9:48 AM

## 2013-03-16 NOTE — Progress Notes (Signed)
POD # 1 AVR/ CABG  Extubated this AM  Back from HD  BP 123/52  Pulse 108  Temp(Src) 98.8 F (37.1 C) (Core (Comment))  Resp 28  Ht 5\' 4"  (1.626 m)  Wt 185 lb 13.6 oz (84.3 kg)  BMI 31.89 kg/m2  SpO2 100%  PA 30/14, CI= 6 on dopamine and milrinone   Intake/Output Summary (Last 24 hours) at 03/16/13 1809 Last data filed at 03/16/13 1737  Gross per 24 hour  Intake 2670.35 ml  Output    485 ml  Net 2185.35 ml   K= 3.7 post dialysis

## 2013-03-16 NOTE — Progress Notes (Signed)
[facility] ROUNDING NOTE   Subjective:   Interval History:pateint stable awake but intubated on ventilator this morning   Objective:  Vital signs in last 24 hours:  Temp:  [95.2 F (35.1 C)-99.3 F (37.4 C)] 98.2 F (36.8 C) (02/04 0940) Pulse Rate:  [83-119] 99 (02/04 0940) Resp:  [12-25] 15 (02/04 0940) BP: (86-135)/(44-67) 109/56 mmHg (02/04 0900) SpO2:  [100 %] 100 % (02/04 0940) FiO2 (%):  [40 %-50 %] 40 % (02/04 0825) Weight:  [74.4 kg (164 lb 0.4 oz)-84.3 kg (185 lb 13.6 oz)] 84.3 kg (185 lb 13.6 oz) (02/04 0600)  Weight change: -3.9 kg (-8 lb 9.6 oz) Filed Weights   03/14/13 1805 03/15/13 2000 03/16/13 0600  Weight: 74.4 kg (164 lb 0.4 oz) 74.4 kg (164 lb 0.4 oz) 84.3 kg (185 lb 13.6 oz)    Intake/Output: I/O last 3 completed shifts: In: 3915.3 [I.V.:2919.3; Blood:566; NG/GT:30; IV Piggyback:400] Out: 565 [Emesis/NG output:105; Chest Tube:460]   Intake/Output this shift:  Total I/O In: 154.2 [I.V.:154.2] Out: 125 [Chest Tube:125]  General appearance: On ventilator s/p surgery, facial and neck swelling  Resp: CTA without rales, rhonchi, or wheezes  Cardio: RRR with Gr II/VI systolic murmur, no rub  GI: + BS, soft  Extremities: No lower extremity edema  Access: L thigh AVG with + bruit     Basic Metabolic Panel:  Recent Labs Lab 03/13/13 0544 03/14/13 1411 03/15/13 0300  03/15/13 1302 03/15/13 1457 03/15/13 2045 03/15/13 2131 03/16/13 0410 03/16/13 0650  NA 136* 129* 137  < > 139 137  --  136* 135* 136*  K 5.0 4.9 4.4  < > 4.7 5.6*  --  4.8 6.3* 6.2*  CL 93* 87* 93*  --   --   --   --  105 100 100  CO2 28 23 27   --   --   --   --   --  19 19  GLUCOSE 179* 154* 130*  < > 180* 193*  --  125* 123* 149*  BUN 28* 44* 23  --   --   --   --  27* 32* 33*  CREATININE 6.58* 9.26* 5.74*  --   --   --  5.49* 5.70* 6.03* 6.32*  CALCIUM 8.4 8.6 8.8  --   --   --   --   --  8.5 8.5  MG  --   --   --   --   --   --  2.0  --  2.0  --   PHOS  --   6.9*  --   --   --   --   --   --   --   --   < > = values in this interval not displayed.  Liver Function Tests:  Recent Labs Lab 03/13/13 0544 03/14/13 1411  AST 26  --   ALT 18  --   ALKPHOS 66  --   BILITOT 0.3  --   PROT 6.5  --   ALBUMIN 2.7* 2.8*   No results found for this basename: LIPASE, AMYLASE,  in the last 168 hours No results found for this basename: AMMONIA,  in the last 168 hours  CBC:  Recent Labs Lab 03/13/13 0544 03/15/13 0300  03/15/13 1150  03/15/13 1400 03/15/13 1457 03/15/13 2045 03/15/13 2131 03/16/13 0410  WBC 3.9* 3.9*  --   --   --  9.0  --  10.8*  --  16.1*  HGB  10.1* 9.8*  < > 8.6*  < > 10.8* 11.6* 9.4* 9.5* 9.9*  HCT 29.2* 28.6*  < > 24.8*  < > 30.8* 34.0* 26.6* 28.0* 28.7*  MCV 95.4 95.3  --   --   --  91.1  --  89.6  --  90.5  PLT 95* 79*  --  48*  --  100*  --  171  --  197  < > = values in this interval not displayed.  Cardiac Enzymes: No results found for this basename: CKTOTAL, CKMB, CKMBINDEX, TROPONINI,  in the last 168 hours  BNP: No components found with this basename: POCBNP,   CBG:  Recent Labs Lab 03/16/13 0117 03/16/13 0220 03/16/13 0315 03/16/13 0409 03/16/13 0525  GLUCAP 106* 101* 117* 115* 111*    Microbiology: Results for orders placed during the hospital encounter of 03/08/13  MRSA PCR SCREENING     Status: None   Collection Time    03/08/13  5:52 PM      Result Value Range Status   MRSA by PCR NEGATIVE  NEGATIVE Final   Comment:            The GeneXpert MRSA Assay (FDA     approved for NASAL specimens     only), is one component of a     comprehensive MRSA colonization     surveillance program. It is not     intended to diagnose MRSA     infection nor to guide or     monitor treatment for     MRSA infections.    Coagulation Studies:  Recent Labs  03/15/13 1400 03/16/13 0410  LABPROT 14.9 13.0  INR 1.20 1.00    Urinalysis: No results found for this basename: COLORURINE, APPERANCEUR,  LABSPEC, PHURINE, GLUCOSEU, HGBUR, BILIRUBINUR, KETONESUR, PROTEINUR, UROBILINOGEN, NITRITE, LEUKOCYTESUR,  in the last 72 hours    Imaging: Dg Chest 2 View  03/14/2013   CLINICAL DATA:  Preoperative films.  EXAM: CHEST  2 VIEW  COMPARISON:  CT chest 03/11/2013 and single view of the chest 03/08/2013.  FINDINGS: Lungs are clear. Heart size is normal. No pneumothorax or pleural effusion. No focal bony abnormality.  IMPRESSION: No acute disease.   Electronically Signed   By: Inge Rise M.D.   On: 03/14/2013 22:35   Dg Chest Portable 1 View In Am  03/16/2013   CLINICAL DATA:  Status post aortic valve replacement.  EXAM: PORTABLE CHEST - 1 VIEW  COMPARISON:  04/04/2013 and 03/14/2013  FINDINGS: 0548 hrs. Interval increase in lung volumes with improved aeration bilaterally. Bilateral chest tubes remain in place without evidence for pneumothorax. Left subclavian central line remains in place. There is a loop in the line formed over the left clavicle which may be external to the patient. This is stable in the interval. The tip of this line is directed laterally and may be resting against the wall of the SVC. Right subclavian pulmonary artery catheter tip is looped in the main pulmonary artery. Endotracheal tube tip is 3.7 cm above the base of the carina. The NG tube passes into the stomach although the distal tip position is not included on the film. Cardiopericardial silhouette is upper limits of normal for size.  IMPRESSION: Interval increase lung volumes with improved aeration bilaterally. No overt edema or focal lung consolidation.  The loop in the left subclavian central line, overlying the left clavicle, may be external to the patient in the tip of this catheter is directed laterally and  may rests against the wall of the superior vena cava.   Electronically Signed   By: Misty Stanley M.D.   On: 03/16/2013 07:11   Dg Chest Port 1 View  03/15/2013   CLINICAL DATA:  Central line placement.  Incorrect  needle count.  EXAM: PORTABLE CHEST - 1 VIEW  COMPARISON:  03/14/2013 and 03/08/2013  FINDINGS: Endotracheal tube, Swan-Ganz catheter, chest tubes, and central line all appear in good position. No pneumothorax. Aortic valve prosthesis visible. No visible retained needle.  Interstitial markings are slightly accentuated due to the shallow inspiration. Heart size and pulmonary vascularity appear normal.  IMPRESSION: No pneumothorax. No visible retained needle. Tubes and lines appear in good position.   Electronically Signed   By: Rozetta Nunnery M.D.   On: 03/15/2013 14:52     Medications:   . sodium chloride 20 mL/hr at 03/16/13 0600  . sodium chloride 20 mL/hr at 03/16/13 0600  . sodium chloride 250 mL (03/16/13 0600)  . dexmedetomidine Stopped (03/16/13 0300)  . insulin (NOVOLIN-R) infusion 5.6 Units/hr (03/16/13 0615)  . milrinone 0.3 mcg/kg/min (03/16/13 0800)  . nitroGLYCERIN Stopped (03/15/13 1649)  . phenylephrine (NEO-SYNEPHRINE) Adult infusion 50 mcg/min (03/16/13 0800)   . aspirin EC  325 mg Oral Daily   Or  . aspirin  324 mg Per Tube Daily  . bisacodyl  10 mg Oral Daily   Or  . bisacodyl  10 mg Rectal Daily  . budesonide-formoterol  2 puff Inhalation BID  . budesonide-formoterol  2 puff Inhalation BID  . calcium carbonate  1 tablet Oral Daily  . cefUROXime (ZINACEF)  IV  1.5 g Intravenous Q24H  . cinacalcet  30 mg Oral Q breakfast  . docusate sodium  200 mg Oral Daily  . DOPamine  0-5 mcg/kg/min Intravenous To OR  . doxercalciferol  4 mcg Intravenous Q T,Th,Sa-HD  . DULoxetine  60 mg Oral QHS  . gabapentin  300 mg Oral QHS  . insulin aspart  0-24 Units Subcutaneous Q4H  . insulin detemir  6 Units Subcutaneous BID  . insulin regular  0-10 Units Intravenous TID WC  . levalbuterol  1.25 mg Nebulization Q6H  . levothyroxine  150 mcg Oral QAC breakfast  . metoprolol tartrate  12.5 mg Oral BID   Or  . metoprolol tartrate  12.5 mg Per Tube BID  . multivitamin with minerals  1  tablet Oral Daily  . nicotine  7 mg Transdermal Daily  . [START ON 03/17/2013] pantoprazole  40 mg Oral Daily  . sodium chloride  3 mL Intravenous Q12H  . tacrolimus  5 mg Oral BID  . vancomycin  750 mg Intravenous Once  . [START ON 03/17/2013] warfarin  2 mg Oral q1800  . [START ON 03/17/2013] Warfarin - Physician Dosing Inpatient   Does not apply q1800   albumin human, lidocaine-prilocaine, metoprolol, midazolam, morphine injection, ondansetron (ZOFRAN) IV, oxyCODONE, pentafluoroprop-tetrafluoroeth, sodium chloride  Assessment/ Plan:   1. Severe aortic valve stenosis/CAD - aortic valve replacement & CABG   Day 2 per Dr. Prescott Gum, stable post-surgery. 2. ESRD - HD on TTS  Hyperkalemia will dialyze today 3. HTN/Volume -discussed with Dr Prescott Gum  No volume to be removed today 4. Anemia - Hgb 9.9, stable.  5. 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  6. DM - insulin per primary.  7. Liver transplant - on Prograf   LOS: 8 Carnie Bruemmer W @TODAY @10 :14 AM

## 2013-03-16 NOTE — Progress Notes (Signed)
Inpatient Diabetes Program Recommendations  AACE/ADA: New Consensus Statement on Inpatient Glycemic Control (2013)  Target Ranges:  Prepandial:   less than 140 mg/dL      Peak postprandial:   less than 180 mg/dL (1-2 hours)      Critically ill patients:  140 - 180 mg/dL   Outpatient Diabetes medications: Levemir 2-3 units daily if CBG's greater than 150 mg/dL Current orders for Inpatient glycemic control: Patient transitioning off insulin drip to Levemir 6 units bid and TCTS correction q hours.  Based on history of ESRD and home Levemir dose, may need to decrease Levemir. Also please consider decreasing Novolog correction to sensitive.  Will follow.    Adah Perl, RN, BC-ADM Inpatient Diabetes Coordinator Pager [phone]

## 2013-03-16 NOTE — Op Note (Signed)
[name], [name]                ACCOUNT NO.:  [id number]  MEDICAL RECORD NO.:  [id number]  LOCATION:  2S03C                        FACILITY:  [facility]  PHYSICIAN:  Ivin Poot, M.D.  DATE OF BIRTH:  April 20, 1955  DATE OF PROCEDURE:  03/15/2013 DATE OF DISCHARGE:                              OPERATIVE REPORT   OPERATION: 1. Aortic valve replacement using a 21-mm mechanical Carbomedics supra-     annular (top hat) valve. 2. CABG x1 with left IMA to LAD.  SURGEON:  Ivin Poot, M.D.  ASSISTANT:  Suzzanne Cloud, PA-C.  ANESTHESIA:  General by Dr. Albertha Ghee.  PREOPERATIVE DIAGNOSIS:  Severe aortic stenosis with class IV congestive heart failure, single-vessel coronary artery disease.  POSTOPERATIVE DIAGNOSIS:  Severe aortic stenosis with class IV congestive heart failure, single-vessel coronary artery disease.  CLINICAL NOTE:  The patient is a 58 year old [place] American female, on chronic hemodialysis for the past 4 years after undergoing liver transplantation at [facility] for hepatitis C.  Over the past 3 months, she has had recurrent episodes of chest pain, and more recently episodes of hypotension during hemodialysis.  Over the past 3 months, she has had cardiac catheterizations twice and echocardiograms twice and was felt after the 2nd set of studies that she had severe aortic stenosis and aortic valve replacement was recommended.  The patient also had mild to moderate coronary disease with a small diagonal having 70% stenosis at the origin and the LAD had a 50% stenosis after bifurcation with the diagonal.  Prior to surgery, I examined the patient in her hospital room and reviewed the results of her cardiac studies.  I discussed aortic valve replacement and coronary artery bypass grafting in detail and how they would help her regarding her aortic stenosis and coronary artery disease.  She understood that the procedure would be performed to improve her  symptoms and relieve her chest pain and shortness of breath, to prevent further progression of left ventricular hypertrophy, aortic stenosis, and preserved LV function, and to improve her long-term survival.  The patient understood the risks of stroke, bleeding, blood transfusion requirement, MI, prolonged ventilator dependence due to her multiple medical problems and COPD, cardiac arrhythmias, pleural effusions, infection, and death.  After reviewing these issues, she demonstrated her understanding and agreed to proceed with surgery under what I felt was an informed consent.  FINDINGS: 1. Severe aortic stenosis, trileaflet valve, with a heavily     calcification of the leaflets and anulus successfully treated with     a 21-mm Carbomedics mechanical valve (top hat). 2. Severely calcified LAD with moderate mid vessel stenosis. 3. Intraoperative anemia requiring 3 packed cell transfusion.  OPERATIVE PROCEDURE:  The patient was brought to the operating room and placed supine on operating room table.  General anesthesia was induced under invasive hemodynamic monitoring.  The chest, abdomen, and legs were prepped with Betadine and draped as a sterile field.  A proper time- out was performed.  A sternal incision was made.  The left sternal elevating retractor was placed in the left internal mammary artery was harvested as a pedicle graft from its origin at the subclavian vessels. It was a  thickened vessel but after papaverine and gentle dilatation with papaverine, it had good flow.  A sternal retractor was then placed using the deep blades due to the patient's obese body habitus and short stature.  The pericardium was opened and suspended.  Heparin was administered and pursestrings were placed in the ascending aorta and right atrium.  The patient was cannulated and placed on cardiopulmonary bypass.  A left ventricular vent was placed via the right superior pulmonary vein.  The LAD  was dissected and it was heavily diseased and calcified.  There was a segment that was adequate for grafting.  The pulmonary artery was gently dissected off the aorta for crossclamp and for the aortotomy and then cardioplegia cannula was replaced for both antegrade and retrograde cold blood cardioplegia.  The patient was cooled to 32 degrees and aortic cross-clamp was applied.  One liter of cold blood cardioplegia was delivered in split doses between the antegrade and retrograde cardioplegia catheters.  There was good cardioplegic arrest and septal temperature dropped less than 15 degrees.  Cardioplegia was delivered every 20 minutes or less.  The left IMA graft to the LAD was then performed.  The pedicle was brought through an opening in the left lateral pericardium.  The LAD was a 1.3 mm vessel.  After the aortotomy was made, there was a fair amount of collateral flow through the vessel and the vessel was isolated with vascular clamps proximally and distally to the anastomosis.  The IMA pedicle was sewn end-to-side with running 8-0 Prolene.  There was excellent flow through the graft.  The bulldog was placed back on the pedicle and the pedicle was secured to the epicardium with 6-0 Prolene. Cardioplegia was redosed.  The attention was then directed to the aortic valve and transverse aortotomy was made.  The aortic valve was inspected.  It is heavily calcified and trileaflet.  The leaflets were excised and the anulus was debrided of calcium.  The outflow tract was irrigated with copious amounts of cold saline.  The anulus was sized to a 21-mm Carbomedics mechanical valve.  The valve sutures were then placed around the circumference numbering 16 total with 2-0 Ethibond pledgeted subannular sutures.  The valve was selected from the shelf and after being checked was used for implantation.  The valve sutures were placed through the sewing ring of the valve.  The valve was then seated and  the sutures were tied.  The leaflets moved freely without impingement.  The coronary ostium widely patent.  There were no spaces between the sutures, which were checked with a nerve hook to make sure that there would be no perivalvular leak.  After successful implantation of the valve, the aortotomy was closed in layers using 2 running 4-0 Prolene.  Before releasing of the crossclamp, air was vented from the coronaries on the left-sided heart using retrograde warm blood cardioplegia and usual de- airing maneuvers.  The crossclamp was then removed.  The heart was then reperfused for several minutes.  The aortotomy was hemostatic.  The mammary artery anastomosis was hemostatic.  Temporary pacer wires were applied.  After the patient had adequately rewarmed and reperfused, the lungs were expanded and the ventilator was resumed.  The patient was then weaned from cardiopulmonary bypass.  The patient needs an additional period of resting on bypass before separating from cardiopulmonary bypass.  The 2D echocardiogram showed the valve to be working well, and global LV function gradually returned back to baseline.  The patient did not need  to be paced.  The venous cannula was removed.  The patient remained stable.  Protamine was then slowly administered.  Cardiac output was 5 L.  With protamine, systolic pressure dropped from 90-100 to 80-90, and the patient was given additional volume.  The protamine was completed.  After the protamine was totally completed, the pulmonary artery pressures rose to 80/40 from 40/20 and was felt the patient had likely protamine reaction.  However, systolic blood pressure was maintained and CVP remained normal and cardiac output was adequate.  We gave the patient steroids and milrinone was started and the PA pressure is gradually returned back to normal. The echo showed the valve to be working well.  The sternal retractor was then removed after the aortic cannula  was removed.  The mediastinum was irrigated and hemostasis was achieved. Anterior mediastinal and right and left pleural chest tubes were placed and brought through separate incisions.  The superior pericardial fat was closed over the aorta.  The sternum was closed with interrupted steel wire.  The pectoralis fascia was closed with running #1 Vicryl.  The subcutaneous and skin layers were closed in running Vicryl and sterile dressings were applied. Total cardiopulmonary bypass time was 110 minutes.  After the sternotomy was closed, a left subclavian triple-lumen catheter was placed for IV access as the patient has been on dialysis, and has no peripheral venous access that could be used for postoperative medications.  An x-ray in the operating room showed the central line to be in an adequate position.     Ivin Poot, M.D.     PV/MEDQ  D:  03/15/2013  T:  03/16/2013  Job:  [id number]  cc:   Quay Burow, M.D. Shelva Majestic, M.D.  [facility]

## 2013-03-17 ENCOUNTER — Inpatient Hospital Stay (HOSPITAL_COMMUNITY): Payer: Medicare Other

## 2013-03-17 LAB — COMPREHENSIVE METABOLIC PANEL
ALT: 38 U/L — ABNORMAL HIGH (ref 0–35)
AST: 67 U/L — ABNORMAL HIGH (ref 0–37)
Albumin: 2.4 g/dL — ABNORMAL LOW (ref 3.5–5.2)
Alkaline Phosphatase: 64 U/L (ref 39–117)
BUN: 16 mg/dL (ref 6–23)
CO2: 24 mEq/L (ref 19–32)
Calcium: 8.2 mg/dL — ABNORMAL LOW (ref 8.4–10.5)
Chloride: 98 mEq/L (ref 96–112)
Creatinine, Ser: 3.95 mg/dL — ABNORMAL HIGH (ref 0.50–1.10)
GFR calc Af Amer: 13 mL/min — ABNORMAL LOW (ref >90)
GFR calc non Af Amer: 12 mL/min — ABNORMAL LOW (ref >90)
Glucose, Bld: 168 mg/dL — ABNORMAL HIGH (ref 70–99)
Potassium: 4.3 mEq/L (ref 3.7–5.3)
Sodium: 138 mEq/L (ref 137–147)
Total Bilirubin: 0.4 mg/dL (ref 0.3–1.2)
Total Protein: 5.7 g/dL — ABNORMAL LOW (ref 6.0–8.3)

## 2013-03-17 LAB — CBC
HCT: 24.8 % — ABNORMAL LOW (ref 36.0–46.0)
Hemoglobin: 8.4 g/dL — ABNORMAL LOW (ref 12.0–15.0)
MCH: 31.5 pg (ref 26.0–34.0)
MCHC: 33.9 g/dL (ref 30.0–36.0)
MCV: 92.9 fL (ref 78.0–100.0)
Platelets: 94 10*3/uL — ABNORMAL LOW (ref 150–400)
RBC: 2.67 MIL/uL — ABNORMAL LOW (ref 3.87–5.11)
RDW: 15.9 % — ABNORMAL HIGH (ref 11.5–15.5)
WBC: 11.7 10*3/uL — ABNORMAL HIGH (ref 4.0–10.5)

## 2013-03-17 LAB — PROTIME-INR
INR: 1.12 (ref 0.00–1.49)
Prothrombin Time: 14.2 seconds (ref 11.6–15.2)

## 2013-03-17 LAB — POCT I-STAT 3, ART BLOOD GAS (G3+)
ACID-BASE EXCESS: 1 mmol/L (ref 0.0–2.0)
Bicarbonate: 25.8 mEq/L — ABNORMAL HIGH (ref 20.0–24.0)
O2 SAT: 89 %
PO2 ART: 55 mmHg — AB (ref 80.0–100.0)
TCO2: 27 mmol/L (ref 0–100)
pCO2 arterial: 40.3 mmHg (ref 35.0–45.0)
pH, Arterial: 7.416 (ref 7.350–7.450)

## 2013-03-17 LAB — GLUCOSE, CAPILLARY
GLUCOSE-CAPILLARY: 79 mg/dL (ref 70–99)
GLUCOSE-CAPILLARY: 92 mg/dL (ref 70–99)
Glucose-Capillary: 173 mg/dL — ABNORMAL HIGH (ref 70–99)
Glucose-Capillary: 130 mg/dL — ABNORMAL HIGH (ref 70–99)
Glucose-Capillary: 117 mg/dL — ABNORMAL HIGH (ref 70–99)
Glucose-Capillary: 136 mg/dL — ABNORMAL HIGH (ref 70–99)

## 2013-03-17 MED ORDER — NEPRO/CARBSTEADY PO LIQD: 237.0000 mL | ORAL | Status: DC | PRN

## 2013-03-17 MED ORDER — SODIUM CHLORIDE 0.9 % IV SOLN: 100.0000 mL | INTRAVENOUS | Status: DC | PRN

## 2013-03-17 MED ORDER — PENTAFLUOROPROP-TETRAFLUOROETH EX AERO: 1.0000 "application " | INHALATION_SPRAY | CUTANEOUS | Status: DC | PRN

## 2013-03-17 MED ORDER — HEPARIN SODIUM (PORCINE) 1000 UNIT/ML DIALYSIS: 1000.0000 [IU] | INTRAMUSCULAR | Status: DC | PRN

## 2013-03-17 MED ORDER — ALTEPLASE 2 MG IJ SOLR: 2.0000 mg | Freq: Once | INTRAMUSCULAR | Status: DC | PRN

## 2013-03-17 MED ORDER — LIDOCAINE-PRILOCAINE 2.5-2.5 % EX CREA: 1.0000 "application " | TOPICAL_CREAM | CUTANEOUS | Status: DC | PRN

## 2013-03-17 MED ORDER — DOXERCALCIFEROL 4 MCG/2ML IV SOLN
INTRAVENOUS | Status: AC
  Administered 2013-03-17: 4 ug
  Filled 2013-03-17: qty 2

## 2013-03-17 MED ORDER — LIDOCAINE HCL (PF) 1 % IJ SOLN: 5.0000 mL | INTRAMUSCULAR | Status: DC | PRN

## 2013-03-17 MED ORDER — SODIUM CHLORIDE 0.9 % IJ SOLN: 10.0000 mL | INTRAMUSCULAR | Status: DC | PRN

## 2013-03-17 MED ORDER — OXYCODONE HCL 5 MG PO TABS
5.0000 mg | ORAL_TABLET | Freq: Four times a day (QID) | ORAL | Status: DC | PRN
  Administered 2013-03-17 – 2013-03-22 (×10): 5 mg via ORAL
  Filled 2013-03-17 (×10): qty 1

## 2013-03-17 MED ORDER — SODIUM CHLORIDE 0.9 % IJ SOLN
10.0000 mL | Freq: Two times a day (BID) | INTRAMUSCULAR | Status: DC
  Administered 2013-03-17: 30 mL via INTRAVENOUS
  Administered 2013-03-17: 10 mL via INTRAVENOUS
  Administered 2013-03-18 (×2): 30 mL via INTRAVENOUS
  Administered 2013-03-19 – 2013-03-20 (×2): 10 mL via INTRAVENOUS
  Administered 2013-03-20: 50 mL via INTRAVENOUS
  Administered 2013-03-21: 30 mL via INTRAVENOUS
  Administered 2013-03-21 – 2013-03-22 (×2): 10 mL via INTRAVENOUS
  Administered 2013-03-22 – 2013-03-23 (×2): 30 mL via INTRAVENOUS
  Administered 2013-03-25: 10 mL via INTRAVENOUS

## 2013-03-17 MED FILL — Milrinone Lactate in Dextrose 5% IV Soln 20 MG/100ML: INTRAVENOUS | Qty: 100 | Status: AC

## 2013-03-17 NOTE — Progress Notes (Signed)
[facility] DAILY ICU PROGRESS NOTE                   [address]            [place] [address]          [phone]   2 Days Post-Op Procedure(s) (LRB): AORTIC VALVE REPLACEMENT (AVR) (N/A) CORONARY ARTERY BYPASS GRAFTING (CABG) times one using left internal mammary artery. (N/A) INTRAOPERATIVE TRANSESOPHAGEAL ECHOCARDIOGRAM (N/A)  Total Length of Stay:  LOS: 9 days   Subjective: Feels ok  Objective: Vital signs in last 24 hours: Temp:  [98.1 F (36.7 C)-99.3 F (37.4 C)] 98.8 F (37.1 C) (02/05 0800) Pulse Rate:  [98-115] 106 (02/05 0800) Cardiac Rhythm:  [-] Sinus tachycardia (02/05 0800) Resp:  [0-37] 27 (02/05 0800) BP: (87-156)/(45-80) 99/51 mmHg (02/05 0800) SpO2:  [95 %-100 %] 100 % (02/05 0850) Weight:  [186 lb 1.1 oz (84.4 kg)] 186 lb 1.1 oz (84.4 kg) (02/05 0600)  Filed Weights   03/15/13 2000 03/16/13 0600 03/17/13 0600  Weight: 164 lb 0.4 oz (74.4 kg) 185 lb 13.6 oz (84.3 kg) 186 lb 1.1 oz (84.4 kg)    Weight change: 22 lb 0.7 oz (10 kg)   Hemodynamic parameters for last 24 hours: PAP: (22-37)/(7-22) 36/16 mmHg CO:  [5.9 L/min-10.9 L/min] 5.9 L/min CI:  [3.3 L/min/m2-6 L/min/m2] 3.3 L/min/m2  Intake/Output from previous day: 02/04 0701 - 02/05 0700 In: 2191.9 [I.V.:1741.9; IV Piggyback:450] Out: 390 [Chest Tube:390]  Intake/Output this shift:    Current Meds: Scheduled Meds: . aspirin EC  325 mg Oral Daily   Or  . aspirin  324 mg Per Tube Daily  . bisacodyl  10 mg Oral Daily   Or  . bisacodyl  10 mg Rectal Daily  . budesonide-formoterol  2 puff Inhalation BID  . calcium carbonate  1 tablet Oral Daily  . cinacalcet  30 mg Oral Q breakfast  . docusate sodium  200 mg Oral Daily  . doxercalciferol  4 mcg Intravenous Q T,Th,Sa-HD  . DULoxetine  60 mg Oral QHS  . gabapentin  300 mg Oral QHS  . insulin aspart  0-24 Units Subcutaneous Q4H  . insulin detemir  6 Units Subcutaneous BID  . levalbuterol  1.25 mg Nebulization Q6H  .  levothyroxine  150 mcg Oral QAC breakfast  . metoprolol tartrate  12.5 mg Oral BID   Or  . metoprolol tartrate  12.5 mg Per Tube BID  . multivitamin with minerals  1 tablet Oral Daily  . nicotine  7 mg Transdermal Daily  . pantoprazole  40 mg Oral Daily  . sodium chloride  3 mL Intravenous Q12H  . tacrolimus  5 mg Oral BID  . warfarin  2 mg Oral q1800  . Warfarin - Physician Dosing Inpatient   Does not apply q1800   Continuous Infusions: . sodium chloride 20 mL/hr at 03/16/13 0600  . sodium chloride 20 mL/hr at 03/17/13 0600  . sodium chloride 250 mL (03/17/13 0600)  . DOPamine 5 mcg/kg/min (03/17/13 0600)  . milrinone Stopped (03/16/13 2120)  . nitroGLYCERIN Stopped (03/15/13 1649)  . phenylephrine (NEO-SYNEPHRINE) Adult infusion 10 mcg/min (03/17/13 0600)   PRN Meds:.sodium chloride, feeding supplement (NEPRO CARB STEADY), heparin, lidocaine (PF), lidocaine-prilocaine, metoprolol, midazolam, morphine injection, ondansetron (ZOFRAN) IV, oxyCODONE, pentafluoroprop-tetrafluoroeth, sodium chloride  General appearance: alert, cooperative and no distress Neurologic: intact Heart: regular rate and rhythm Lungs: dim in bases Abdomen: soft, nontender Extremities: min edema Wound: incis ok  Lab Results: CBC: Recent Labs  03/16/13 1500 03/16/13 1729 03/17/13 0400  WBC 13.9*  --  11.7*  HGB 8.8* 8.8* 8.4*  HCT 25.7* 26.0* 24.8*  PLT 144*  --  94*   BMET:  Recent Labs  03/16/13 0650  03/16/13 1729 03/17/13 0400  NA 136*  --  136* 138  K 6.2*  < > 3.7 4.3  CL 100  --  99 98  CO2 19  --   --  24  GLUCOSE 149*  --  145* 168*  BUN 33*  --  9 16  CREATININE 6.32*  < > 3.00* 3.95*  CALCIUM 8.5  --   --  8.2*  < > = values in this interval not displayed.  PT/INR:  Recent Labs  03/17/13 0400  LABPROT 14.2  INR 1.12   Radiology: Dg Chest Port 1 View  03/17/2013   CLINICAL DATA:  Status post aortic valve replacement.  EXAM: PORTABLE CHEST - 1 VIEW  COMPARISON:  DG CHEST 1V  PORT dated 03/16/2013; DG CHEST 1V PORT dated 03/15/2013  FINDINGS: Right-sided Swan-Ganz catheter. Likely looped in the pulmonary outflow tract.  Bilateral chest tubes are unchanged in position. Extubation and removal of nasogastric tube. Left subclavian line again is looped over the left clavicle with its tip at the high SVC. This may contact its lateral wall.  Patient rotated right. Cardiomegaly accentuated by AP portable technique. Probable small bilateral pleural effusions, new. No pneumothorax. Decreased lung volumes with new bibasilar airspace disease.  IMPRESSION: Extubation with diminished lung volumes and development of bibasilar atelectasis.  Probable small bilateral pleural effusions.  Similar appearance and course of the left subclavian line. Please see yesterday's report for further discussion.   Electronically Signed   By: Abigail Miyamoto M.D.   On: 03/17/2013 08:10   Dg Chest Portable 1 View In Am  03/16/2013   CLINICAL DATA:  Status post aortic valve replacement.  EXAM: PORTABLE CHEST - 1 VIEW  COMPARISON:  04/04/2013 and 03/14/2013  FINDINGS: 0548 hrs. Interval increase in lung volumes with improved aeration bilaterally. Bilateral chest tubes remain in place without evidence for pneumothorax. Left subclavian central line remains in place. There is a loop in the line formed over the left clavicle which may be external to the patient. This is stable in the interval. The tip of this line is directed laterally and may be resting against the wall of the SVC. Right subclavian pulmonary artery catheter tip is looped in the main pulmonary artery. Endotracheal tube tip is 3.7 cm above the base of the carina. The NG tube passes into the stomach although the distal tip position is not included on the film. Cardiopericardial silhouette is upper limits of normal for size.  IMPRESSION: Interval increase lung volumes with improved aeration bilaterally. No overt edema or focal lung consolidation.  The loop in the left  subclavian central line, overlying the left clavicle, may be external to the patient in the tip of this catheter is directed laterally and may rests against the wall of the superior vena cava.   Electronically Signed   By: Misty Stanley M.D.   On: 03/16/2013 07:11   Dg Chest Port 1 View  03/15/2013   CLINICAL DATA:  Central line placement.  Incorrect needle count.  EXAM: PORTABLE CHEST - 1 VIEW  COMPARISON:  03/14/2013 and 03/08/2013  FINDINGS: Endotracheal tube, Swan-Ganz catheter, chest tubes, and central line all appear in good position. No pneumothorax. Aortic valve prosthesis visible. No  visible retained needle.  Interstitial markings are slightly accentuated due to the shallow inspiration. Heart size and pulmonary vascularity appear normal.  IMPRESSION: No pneumothorax. No visible retained needle. Tubes and lines appear in good position.   Electronically Signed   By: Rozetta Nunnery M.D.   On: 03/15/2013 14:52     Assessment/Plan: S/P Procedure(s) (LRB): AORTIC VALVE REPLACEMENT (AVR) (N/A) CORONARY ARTERY BYPASS GRAFTING (CABG) times one using left internal mammary artery. (N/A) INTRAOPERATIVE TRANSESOPHAGEAL ECHOCARDIOGRAM (N/A)   1 steady progress 2 wean gtts as able, d/c swan today 3 poss dialysis today 4 loop on left subclavian line? Vs external disc  5 swallow study today- bedside 6 d/c mediastinal tube 7 cont coumadin     GOLD,WAYNE E 03/17/2013 9:24 AM

## 2013-03-17 NOTE — Progress Notes (Signed)
Patient ID: [name], female   DOB: 07/16/1955, 58 y.o.   MRN: [id number]  SICU Evening Rounds:  Hemodynamically stable on dop 5 , neo 15 mcg  Sinus rhythm 97/min,  sats 100%  On HD now.  Glucose under good control

## 2013-03-17 NOTE — Progress Notes (Signed)
SLP Cancellation Note  Patient Details Name: LADAVIA LINDENBAUM MRN: [id number] DOB: 1955/11/05   Cancelled treatment:       Reason Eval/Treat Not Completed: Fatigue/lethargy limiting ability to participate  Gabriel Rainwater [place], [place] [phone]  Gabriel Rainwater Meryl 03/17/2013, 4:03 PM

## 2013-03-17 NOTE — Progress Notes (Signed)
[facility] ROUNDING NOTE   Subjective:   Interval History: extubated and alert no complaints except for appropriate discomfort  Objective:  Vital signs in last 24 hours:  Temp:  [98.1 F (36.7 C)-99.3 F (37.4 C)] 98.8 F (37.1 C) (02/05 0800) Pulse Rate:  [98-115] 106 (02/05 0800) Resp:  [0-37] 27 (02/05 0800) BP: (87-156)/(45-80) 99/51 mmHg (02/05 0800) SpO2:  [95 %-100 %] 100 % (02/05 0850) Weight:  [84.4 kg (186 lb 1.1 oz)] 84.4 kg (186 lb 1.1 oz) (02/05 0600)  Weight change: 10 kg (22 lb 0.7 oz) Filed Weights   03/15/13 2000 03/16/13 0600 03/17/13 0600  Weight: 74.4 kg (164 lb 0.4 oz) 84.3 kg (185 lb 13.6 oz) 84.4 kg (186 lb 1.1 oz)    Intake/Output: I/O last 3 completed shifts: In: 3317.3 [I.V.:2737.3; NG/GT:30; IV Piggyback:550] Out: 675 [Emesis/NG output:105; Chest Tube:570]   Intake/Output this shift:     General appearance  Alert and conversant  Resp: CTA without rales, rhonchi, or wheezes  Cardio: RRR with Gr II/VI systolic murmur, no rub  GI: + BS, soft  Extremities: No lower extremity edema  Access: L thigh AVG with + bruit     Basic Metabolic Panel:  Recent Labs Lab 03/14/13 1411 03/15/13 0300  03/15/13 2045 03/15/13 2131 03/16/13 0410 03/16/13 0650 03/16/13 0946 03/16/13 1500 03/16/13 1729 03/17/13 0400  NA 129* 137  < >  --  136* 135* 136*  --   --  136* 138  K 4.9 4.4  < >  --  4.8 6.3* 6.2* 5.8*  --  3.7 4.3  CL 87* 93*  --   --  105 100 100  --   --  99 98  CO2 23 27  --   --   --  19 19  --   --   --  24  GLUCOSE 154* 130*  < >  --  125* 123* 149*  --   --  145* 168*  BUN 44* 23  --   --  27* 32* 33*  --   --  9 16  CREATININE 9.26* 5.74*  --  5.49* 5.70* 6.03* 6.32*  --  2.20* 3.00* 3.95*  CALCIUM 8.6 8.8  --   --   --  8.5 8.5  --   --   --  8.2*  MG  --   --   --  2.0  --  2.0  --   --  1.9  --   --   PHOS 6.9*  --   --   --   --   --   --   --   --   --   --   < > = values in this interval not  displayed.  Liver Function Tests:  Recent Labs Lab 03/13/13 0544 03/14/13 1411 03/16/13 1500 03/17/13 0400  AST 26  --  110* 67*  ALT 18  --  42* 38*  ALKPHOS 66  --  64 64  BILITOT 0.3  --  0.4 0.4  PROT 6.5  --  5.6* 5.7*  ALBUMIN 2.7* 2.8* 2.7* 2.4*   No results found for this basename: LIPASE, AMYLASE,  in the last 168 hours No results found for this basename: AMMONIA,  in the last 168 hours  CBC:  Recent Labs Lab 03/15/13 1400  03/15/13 2045 03/15/13 2131 03/16/13 0410 03/16/13 1500 03/16/13 1729 03/17/13 0400  WBC 9.0  --  10.8*  --  16.1*  13.9*  --  11.7*  HGB 10.8*  < > 9.4* 9.5* 9.9* 8.8* 8.8* 8.4*  HCT 30.8*  < > 26.6* 28.0* 28.7* 25.7* 26.0* 24.8*  MCV 91.1  --  89.6  --  90.5 91.5  --  92.9  PLT 100*  --  171  --  197 144*  --  94*  < > = values in this interval not displayed.  Cardiac Enzymes: No results found for this basename: CKTOTAL, CKMB, CKMBINDEX, TROPONINI,  in the last 168 hours  BNP: No components found with this basename: POCBNP,   CBG:  Recent Labs Lab 03/16/13 1516 03/16/13 1917 03/16/13 2323 03/17/13 0345 03/17/13 0725  GLUCAP 108* 159* 158* 173* 130*    Microbiology: Results for orders placed during the hospital encounter of 03/08/13  MRSA PCR SCREENING     Status: None   Collection Time    03/08/13  5:52 PM      Result Value Range Status   MRSA by PCR NEGATIVE  NEGATIVE Final   Comment:            The GeneXpert MRSA Assay (FDA     approved for NASAL specimens     only), is one component of a     comprehensive MRSA colonization     surveillance program. It is not     intended to diagnose MRSA     infection nor to guide or     monitor treatment for     MRSA infections.    Coagulation Studies:  Recent Labs  03/15/13 1400 03/16/13 0410 03/17/13 0400  LABPROT 14.9 13.0 14.2  INR 1.20 1.00 1.12    Urinalysis: No results found for this basename: COLORURINE, APPERANCEUR, LABSPEC, PHURINE, GLUCOSEU, HGBUR,  BILIRUBINUR, KETONESUR, PROTEINUR, UROBILINOGEN, NITRITE, LEUKOCYTESUR,  in the last 72 hours    Imaging: Dg Chest Port 1 View  03/17/2013   CLINICAL DATA:  Status post aortic valve replacement.  EXAM: PORTABLE CHEST - 1 VIEW  COMPARISON:  DG CHEST 1V PORT dated 03/16/2013; DG CHEST 1V PORT dated 03/15/2013  FINDINGS: Right-sided Swan-Ganz catheter. Likely looped in the pulmonary outflow tract.  Bilateral chest tubes are unchanged in position. Extubation and removal of nasogastric tube. Left subclavian line again is looped over the left clavicle with its tip at the high SVC. This may contact its lateral wall.  Patient rotated right. Cardiomegaly accentuated by AP portable technique. Probable small bilateral pleural effusions, new. No pneumothorax. Decreased lung volumes with new bibasilar airspace disease.  IMPRESSION: Extubation with diminished lung volumes and development of bibasilar atelectasis.  Probable small bilateral pleural effusions.  Similar appearance and course of the left subclavian line. Please see yesterday's report for further discussion.   Electronically Signed   By: Jeronimo Greaves M.D.   On: 03/17/2013 08:10   Dg Chest Portable 1 View In Am  03/16/2013   CLINICAL DATA:  Status post aortic valve replacement.  EXAM: PORTABLE CHEST - 1 VIEW  COMPARISON:  04/04/2013 and 03/14/2013  FINDINGS: 0548 hrs. Interval increase in lung volumes with improved aeration bilaterally. Bilateral chest tubes remain in place without evidence for pneumothorax. Left subclavian central line remains in place. There is a loop in the line formed over the left clavicle which may be external to the patient. This is stable in the interval. The tip of this line is directed laterally and may be resting against the wall of the SVC. Right subclavian pulmonary artery catheter tip is looped in the main  pulmonary artery. Endotracheal tube tip is 3.7 cm above the base of the carina. The NG tube passes into the stomach although the  distal tip position is not included on the film. Cardiopericardial silhouette is upper limits of normal for size.  IMPRESSION: Interval increase lung volumes with improved aeration bilaterally. No overt edema or focal lung consolidation.  The loop in the left subclavian central line, overlying the left clavicle, may be external to the patient in the tip of this catheter is directed laterally and may rests against the wall of the superior vena cava.   Electronically Signed   By: Misty Stanley M.D.   On: 03/16/2013 07:11   Dg Chest Port 1 View  03/15/2013   CLINICAL DATA:  Central line placement.  Incorrect needle count.  EXAM: PORTABLE CHEST - 1 VIEW  COMPARISON:  03/14/2013 and 03/08/2013  FINDINGS: Endotracheal tube, Swan-Ganz catheter, chest tubes, and central line all appear in good position. No pneumothorax. Aortic valve prosthesis visible. No visible retained needle.  Interstitial markings are slightly accentuated due to the shallow inspiration. Heart size and pulmonary vascularity appear normal.  IMPRESSION: No pneumothorax. No visible retained needle. Tubes and lines appear in good position.   Electronically Signed   By: Rozetta Nunnery M.D.   On: 03/15/2013 14:52     Medications:   . sodium chloride 20 mL/hr at 03/16/13 0600  . sodium chloride 20 mL/hr at 03/17/13 0600  . sodium chloride 250 mL (03/17/13 0600)  . DOPamine 5 mcg/kg/min (03/17/13 0600)  . milrinone Stopped (03/16/13 2120)  . nitroGLYCERIN Stopped (03/15/13 1649)  . phenylephrine (NEO-SYNEPHRINE) Adult infusion 10 mcg/min (03/17/13 0600)   . aspirin EC  325 mg Oral Daily   Or  . aspirin  324 mg Per Tube Daily  . bisacodyl  10 mg Oral Daily   Or  . bisacodyl  10 mg Rectal Daily  . budesonide-formoterol  2 puff Inhalation BID  . calcium carbonate  1 tablet Oral Daily  . cinacalcet  30 mg Oral Q breakfast  . docusate sodium  200 mg Oral Daily  . doxercalciferol  4 mcg Intravenous Q T,Th,Sa-HD  . DULoxetine  60 mg Oral QHS   . gabapentin  300 mg Oral QHS  . insulin aspart  0-24 Units Subcutaneous Q4H  . insulin detemir  6 Units Subcutaneous BID  . levalbuterol  1.25 mg Nebulization Q6H  . levothyroxine  150 mcg Oral QAC breakfast  . metoprolol tartrate  12.5 mg Oral BID   Or  . metoprolol tartrate  12.5 mg Per Tube BID  . multivitamin with minerals  1 tablet Oral Daily  . nicotine  7 mg Transdermal Daily  . pantoprazole  40 mg Oral Daily  . sodium chloride  3 mL Intravenous Q12H  . tacrolimus  5 mg Oral BID  . warfarin  2 mg Oral q1800  . Warfarin - Physician Dosing Inpatient   Does not apply q1800   sodium chloride, feeding supplement (NEPRO CARB STEADY), heparin, lidocaine (PF), lidocaine-prilocaine, metoprolol, midazolam, morphine injection, ondansetron (ZOFRAN) IV, oxyCODONE, pentafluoroprop-tetrafluoroeth, sodium chloride  Assessment/ Plan:   Assessment/ Plan:   1. Severe aortic valve stenosis/CAD - aortic valve replacement & CABG Day 3 per Dr. Prescott Gum, stable post-surgery. 2. ESRD - HD on TTS Hyperkalemia resolved 3. HTN/Volume - plan 0-1 L off today  4. Anemia - Hgb 8.4  , stable. 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  6. DM -  insulin per primary.  7. Liver transplant - on Prograf        LOS: 9 [name] W @TODAY @10 :08 AM

## 2013-03-18 ENCOUNTER — Inpatient Hospital Stay (HOSPITAL_COMMUNITY): Payer: Medicare Other

## 2013-03-18 LAB — TYPE AND SCREEN
ABO/RH(D): A POS
Antibody Screen: NEGATIVE
Unit division: 0
Unit division: 0
Unit division: 0
Unit division: 0
Unit division: 0
Unit division: 0

## 2013-03-18 LAB — GLUCOSE, CAPILLARY
GLUCOSE-CAPILLARY: 116 mg/dL — AB (ref 70–99)
GLUCOSE-CAPILLARY: 149 mg/dL — AB (ref 70–99)
Glucose-Capillary: 136 mg/dL — ABNORMAL HIGH (ref 70–99)
Glucose-Capillary: 138 mg/dL — ABNORMAL HIGH (ref 70–99)
Glucose-Capillary: 181 mg/dL — ABNORMAL HIGH (ref 70–99)

## 2013-03-18 LAB — COMPREHENSIVE METABOLIC PANEL
ALT: 28 U/L (ref 0–35)
AST: 35 U/L (ref 0–37)
Albumin: 2.2 g/dL — ABNORMAL LOW (ref 3.5–5.2)
Alkaline Phosphatase: 84 U/L (ref 39–117)
BUN: 12 mg/dL (ref 6–23)
CO2: 26 mEq/L (ref 19–32)
Calcium: 8 mg/dL — ABNORMAL LOW (ref 8.4–10.5)
Chloride: 96 mEq/L (ref 96–112)
Creatinine, Ser: 2.86 mg/dL — ABNORMAL HIGH (ref 0.50–1.10)
GFR calc Af Amer: 20 mL/min — ABNORMAL LOW (ref >90)
GFR calc non Af Amer: 17 mL/min — ABNORMAL LOW (ref >90)
Glucose, Bld: 125 mg/dL — ABNORMAL HIGH (ref 70–99)
Potassium: 3.8 mEq/L (ref 3.7–5.3)
Sodium: 136 mEq/L — ABNORMAL LOW (ref 137–147)
Total Bilirubin: 0.5 mg/dL (ref 0.3–1.2)
Total Protein: 5.7 g/dL — ABNORMAL LOW (ref 6.0–8.3)

## 2013-03-18 LAB — CBC
HCT: 24.8 % — ABNORMAL LOW (ref 36.0–46.0)
HCT: 23.7 % — ABNORMAL LOW (ref 36.0–46.0)
Hemoglobin: 8.4 g/dL — ABNORMAL LOW (ref 12.0–15.0)
Hemoglobin: 8.2 g/dL — ABNORMAL LOW (ref 12.0–15.0)
MCH: 31.5 pg (ref 26.0–34.0)
MCH: 32.4 pg (ref 26.0–34.0)
MCHC: 33.9 g/dL (ref 30.0–36.0)
MCHC: 34.6 g/dL (ref 30.0–36.0)
MCV: 92.9 fL (ref 78.0–100.0)
MCV: 93.7 fL (ref 78.0–100.0)
Platelets: 96 10*3/uL — ABNORMAL LOW (ref 150–400)
Platelets: 120 10*3/uL — ABNORMAL LOW (ref 150–400)
RBC: 2.67 MIL/uL — ABNORMAL LOW (ref 3.87–5.11)
RBC: 2.53 MIL/uL — ABNORMAL LOW (ref 3.87–5.11)
RDW: 15.6 % — ABNORMAL HIGH (ref 11.5–15.5)
RDW: 15.5 % (ref 11.5–15.5)
WBC: 11.2 10*3/uL — ABNORMAL HIGH (ref 4.0–10.5)
WBC: 11.6 10*3/uL — ABNORMAL HIGH (ref 4.0–10.5)

## 2013-03-18 LAB — PROTIME-INR
INR: 1.04 (ref 0.00–1.49)
Prothrombin Time: 13.4 seconds (ref 11.6–15.2)

## 2013-03-18 LAB — PREPARE RBC (CROSSMATCH)

## 2013-03-18 MED ORDER — WARFARIN SODIUM 2.5 MG PO TABS
2.5000 mg | ORAL_TABLET | Freq: Every day | ORAL | Status: DC
  Administered 2013-03-18 – 2013-03-19 (×2): 2.5 mg via ORAL
  Filled 2013-03-18 (×3): qty 1

## 2013-03-18 MED ORDER — NEPRO/CARBSTEADY PO LIQD
237.0000 mL | Freq: Two times a day (BID) | ORAL | Status: DC
  Administered 2013-03-20 – 2013-03-29 (×13): 237 mL via ORAL
  Filled 2013-03-18 (×26): qty 237

## 2013-03-18 NOTE — Progress Notes (Signed)
3 Days Post-Op Procedure(s) (LRB): AORTIC VALVE REPLACEMENT (AVR) (N/A) CORONARY ARTERY BYPASS GRAFTING (CABG) times one using left internal mammary artery. (N/A) INTRAOPERATIVE TRANSESOPHAGEAL ECHOCARDIOGRAM (N/A) Subjective: AVR CABG- carbomedics HD, s/p liver tx Fluid overload- needs HD to remove volume today Still on low dose neo, dopa- NSR  Objective: Vital signs in last 24 hours: Temp:  [97.9 F (36.6 C)-98.9 F (37.2 C)] 98.9 F (37.2 C) (02/06 0733) Pulse Rate:  [95-155] 106 (02/06 0700) Cardiac Rhythm:  [-] Normal sinus rhythm;Sinus tachycardia (02/05 2000) Resp:  [0-31] 0 (02/06 0700) BP: (93-166)/(43-76) 115/61 mmHg (02/06 0700) SpO2:  [93 %-100 %] 94 % (02/06 0700) Weight:  [184 lb 1.4 oz (83.5 kg)-185 lb 13.6 oz (84.3 kg)] 185 lb 13.6 oz (84.3 kg) (02/06 0600)  Hemodynamic parameters for last 24 hours: PAP: (33-36)/(13-16) 33/13 mmHg  Intake/Output from previous day: 02/05 0701 - 02/06 0700 In: 1603 [I.V.:1603] Out: 500  Intake/Output this shift:    EXAM Lungs wet extrem warm Neuro intact  Lab Results:  Recent Labs  03/17/13 0400 03/18/13 0355  WBC 11.7* 11.2*  HGB 8.4* 8.4*  HCT 24.8* 24.8*  PLT 94* 96*   BMET:  Recent Labs  03/17/13 0400 03/18/13 0355  NA 138 136*  K 4.3 3.8  CL 98 96  CO2 24 26  GLUCOSE 168* 125*  BUN 16 12  CREATININE 3.95* 2.86*  CALCIUM 8.2* 8.0*    PT/INR:  Recent Labs  03/18/13 0355  LABPROT 13.4  INR 1.04   ABG    Component Value Date/Time   PHART 7.416 03/17/2013 0409   HCO3 25.8* 03/17/2013 0409   TCO2 27 03/17/2013 0409   ACIDBASEDEF 6.0* 03/16/2013 0912   O2SAT 89.0 03/17/2013 0409   CBG (last 3)   Recent Labs  03/17/13 1925 03/17/13 2345 03/18/13 0332  GLUCAP 92 79 116*    Assessment/Plan: S/P Procedure(s) (LRB): AORTIC VALVE REPLACEMENT (AVR) (N/A) CORONARY ARTERY BYPASS GRAFTING (CABG) times one using left internal mammary artery. (N/A) INTRAOPERATIVE TRANSESOPHAGEAL ECHOCARDIOGRAM  (N/A)   Remove Aqua seal dressing Coumadin 2.5 mg HD today- remove [phone] cc Follow Hb- transfuse for Hb< 8  LOS: 10 days    VAN TRIGT III,PETER 03/18/2013

## 2013-03-18 NOTE — Evaluation (Signed)
Physical Therapy Evaluation Patient Details Name: [name] MRN: [id number] DOB: [birth year] Today's Date: 03/18/2013 Time: 3790-2409 PT Time Calculation (min): 23 min  PT Assessment / Plan / Recommendation History of Present Illness  pt admitted with CAD and SOB, s/p AVR and CABG x1  Clinical Impression  Pt admitted with CAD s/p AVR and CABG.  Pt currently limited functionally due to the problems listed. ( See problems list.)   Pt will benefit from PT to maximize function and safety in order to get ready for next venue listed below.     PT Assessment  Patient needs continued PT services    Follow Up Recommendations  SNF (vs HHPT if pt independent enough for PRN assist)    Does the patient have the potential to tolerate intense rehabilitation      Barriers to Discharge Decreased caregiver support      Equipment Recommendations  None recommended by PT    Recommendations for Other Services     Frequency Min 3X/week    Precautions / Restrictions Precautions Precautions: Fall Restrictions Weight Bearing Restrictions:  (Sternal precautions)   Pertinent Vitals/Pain Sats at mid to upper 90's EHR up to the lower 120's      Mobility  Bed Mobility Overal bed mobility: Needs Assistance;+2 for physical assistance Bed Mobility: Sit to Supine Sit to supine: Mod assist;+2 for safety/equipment General bed mobility comments: pt moving well, but in pain at end of session Transfers Overall transfer level: Needs assistance Equipment used:  (pushed w/c) Transfers: Sit to/from Stand Sit to Stand: Min assist General transfer comment: assist to come forward and cues to maintain sternal precautions Ambulation/Gait Ambulation/Gait assistance: Min assist;+2 safety/equipment Ambulation Distance (Feet): 24 Feet Assistive device:  (pushing w/c) Gait Pattern/deviations: Step-through pattern;Decreased step length - right;Decreased step length - left;Decreased stride length Gait velocity:  very slow Gait velocity interpretation: Below normal speed for age/gender    Exercises     PT Diagnosis: Acute pain;Generalized weakness  PT Problem List: Decreased strength;Decreased activity tolerance;Decreased mobility;Decreased knowledge of use of DME;Decreased knowledge of precautions;Pain PT Treatment Interventions: DME instruction;Gait training;Stair training;Functional mobility training;Therapeutic activities;Patient/family education     PT Goals(Current goals can be found in the care plan section) Acute Rehab PT Goals Patient Stated Goal: Ultimately independent PT Goal Formulation: With patient Time For Goal Achievement: 04/01/13 Potential to Achieve Goals: Good  Visit Information  Last PT Received On: 03/18/13 Assistance Needed: +2 (for lines only) History of Present Illness: pt admitted with CAD and SOB, s/p AVR and CABG x1       Prior Functioning  Home Living Family/patient expects to be discharged to:: Private residence Living Arrangements: Alone Available Help at Discharge: Available PRN/intermittently Type of Home: Mobile home Home Access: Stairs to enter [employer] of Steps: 5 Entrance Stairs-Rails: Right Home Layout: One level Home Equipment: [place] - 2 wheels;Cane - single point Prior Function Level of Independence: Independent Communication Communication: No difficulties    Cognition  Cognition Arousal/Alertness: Awake/alert Behavior During Therapy: WFL for tasks assessed/performed Overall Cognitive Status: Within Functional Limits for tasks assessed    Extremity/Trunk Assessment Upper Extremity Assessment Upper Extremity Assessment: Overall WFL for tasks assessed;Generalized weakness Lower Extremity Assessment Lower Extremity Assessment: Generalized weakness;Overall [facility] for tasks assessed   Balance Balance Overall balance assessment: No apparent balance deficits (not formally assessed)  End of Session PT - End of Session Activity  Tolerance: Patient tolerated treatment well;Patient limited by pain Patient left: in bed;with call bell/phone within reach;with nursing/sitter in  room Nurse Communication: Mobility status  GP     [name], Tessie Fass 03/18/2013, 10:12 AM 03/18/2013  [name], [place] [phone]  (pager)

## 2013-03-18 NOTE — Progress Notes (Addendum)
Clinical Social Work Department CLINICAL SOCIAL WORK PLACEMENT NOTE 03/18/2013  Patient:  [name], [name]  Account Number:  [id number] Admit date:  03/08/2013  Clinical Social Worker:  Megan Salon  Date/time:  03/18/2013 03:12 PM  Clinical Social Work is seeking post-discharge placement for this patient at the following level of care:   [place]   (*CSW will update this form in Epic as items are completed)   03/18/2013  Patient/family provided with [place] Department of Clinical Social Work's list of facilities offering this level of care within the geographic area requested by the patient (or if unable, by the patient's family).  03/18/2013  Patient/family informed of their freedom to choose among providers that offer the needed level of care, that participate in Medicare, Medicaid or managed care program needed by the patient, have an available bed and are willing to accept the patient.  03/18/2013  Patient/family informed of [facility]' ownership interest in [facility], as well as of the fact that they are under no obligation to receive care at this facility.  PASARR submitted to EDS on 03/18/2013 PASARR number received from EDS on 03/18/2013  FL2 transmitted to all facilities in geographic area requested by pt/family on  03/18/2013 FL2 transmitted to all facilities within larger geographic area on   Patient informed that his/her managed care company has contracts with or will negotiate with  certain facilities, including the following:     Patient/family informed of bed offers received:  03/22/2013 Patient chooses bed at [facility] Physician recommends and patient chooses bed at    Patient to be transferred to [facility]  on  03/29/2013 Patient to be transferred to facility by [facility]  The following physician request were entered in Epic:   Additional Comments:  Jeanette Caprice, MSW, [place]

## 2013-03-18 NOTE — Progress Notes (Signed)
CSW spoke to patient's daughter over the phone, who stated that they are trying to get 24 hour care for patient at home, so patient would not need to go to a SNF. Patient's daughter, [name], stated she is working this out with her sister and will call social worker back to inform if they will pursue SNF placement. CSW standing by at this time.  Jeanette Caprice, MSW, [place]

## 2013-03-18 NOTE — Progress Notes (Signed)
Clinical Social Work Department BRIEF PSYCHOSOCIAL ASSESSMENT 03/18/2013  Patient:  [name], [name]     Account Number:  [id number]     Admit date:  03/08/2013  Clinical Social Worker:  Megan Salon  Date/Time:  03/18/2013 03:09 PM  Referred by:  RN  Date Referred:  03/18/2013 Referred for  SNF Placement   Other Referral:   Interview type:  Patient Other interview type:    PSYCHOSOCIAL DATA Living Status:  ALONE Admitted from facility:   Level of care:   Primary support name:  Jayme Cloud [phone] Primary support relationship to patient:  CHILD, ADULT Degree of support available:   Good    CURRENT CONCERNS Current Concerns  Post-Acute Placement   Other Concerns:    SOCIAL WORK ASSESSMENT / PLAN Clinical Social Worker received referral for SNF placement at d/c. CSW introduced self and explained reason for visit. CSW explained SNF process to patient.  Patient reported she is agreeable for SNF placement in [facility]. Patient gave CSW permission to follow up with patient's daughter [name], but states she is at work until [place] will complete FL2 for MD's signature and will update patient and family when bed offers are received.   Assessment/plan status:  Psychosocial Support/Ongoing Assessment of Needs Other assessment/ plan:   Information/referral to community resources:   SNF information    PATIENT'S/FAMILY'S RESPONSE TO PLAN OF CARE: Patient is agreeable to plan of care       Jeanette Caprice, MSW, [place]

## 2013-03-18 NOTE — Evaluation (Signed)
Clinical/Bedside Swallow Evaluation Patient Details  Name: [name] MRN: [id number] Date of Birth: 1955/05/28  Today's Date: 03/18/2013 Time: 7619-5093 SLP Time Calculation (min): 20 min  Past Medical History:  Past Medical History  Diagnosis Date  . 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 Chronic back pain   . CAD (coronary artery disease)   . Obesity   . Peripheral vascular disease hands and legs  . Anxiety   . Asthma   . GERD (gastroesophageal reflux disease)     takes Omeprazole daily  . Chronic constipation     takes MIralax and Colace daily  . Anemia     takes Folic Acid daily  . Hypothyroidism     takes Synthroid daily  . Depression     takes Cymbalta for "severe" depression  . Neuromuscular disorder     carpal tunnel in right hand  . Hypertension     takes Metoprolol and Lisinopril daily, sees Dr Bea Graff  . COPD (chronic obstructive pulmonary disease)   . Pneumonia     "today and several times before" (08/30/2012)  . Chronic bronchitis     "q yr w/season changes" (08/30/2012)  . Type II diabetes mellitus     Levemir 2units daily if > 150  . History of blood transfusion     "several" (08/30/2012)  . Hepatitis C   . Migraine     "last migraine was in 2013" (08/30/2012)  . Headache     "at least monthly" (08/30/2012)  . Arthritis     "left hand, back" (08/30/2012)  . End stage renal disease on dialysis 02/27/2011    Kidneys shut down at time of liver transplant in Sept 2011 at [facility] in [place], she has been on HD ever since.  Dialyzes at [facility] HD on TTS schedule.  Had L forearm graft used 10 months then removed Dec 2012 due to suspected infection.  A right upper arm AVG was placed Dec 2012 but she developed steal symptoms acutely and it was ligated the same day.  Never had an AV fistula due to small veins.  Now has L thigh AVG put in Jan 2013, has not clotted to date.    [name] CAD  (coronary artery disease) Jan. 2015    Cath: 20% LAD, 50% D1   Past Surgical History:  Past Surgical History  Procedure Laterality Date  . Liver transplant  10/25/2009    sees Dr Ferol Luz 1 every 6 months, saw last in Dec 2013. [name] Coord [phone]  . Small intestine surgery  90's  . Thrombectomy    . Arteriovenous graft placement Left 10/03/10     forearm  . Avgg removal  12/23/2010    Procedure: REMOVAL OF ARTERIOVENOUS GORETEX GRAFT ([place]);  Surgeon: Elam Dutch, MD;  Location: [place];  Service: Vascular;  Laterality: Left;  procedure started @1736 -1852  . Insertion of dialysis catheter  12/23/2010    Procedure: INSERTION OF DIALYSIS CATHETER;  Surgeon: Elam Dutch, MD;  Location: [place];  Service: Vascular;  Laterality: Right;  Right Internal Jugular 28cm dialysis catheter insertion procedure time 1701-1720   . Cholecystectomy  1993  . Cystoscopy  1990's  . Spinal growth rods  2010    "put 2 metal rods in my back; they had detetriorated" (08/30/2012)  . Av fistula placement  01/29/2011    Procedure: INSERTION OF  ARTERIOVENOUS (AV) GORE-TEX GRAFT ARM;  Surgeon: Elam Dutch, MD;  Location: [facility] OR;  Service: Vascular;  Laterality: Right;  . Av fistula placement  03/10/2011    Procedure: INSERTION OF ARTERIOVENOUS (AV) GORE-TEX GRAFT THIGH;  Surgeon: Elam Dutch, MD;  Location: [place];  Service: Vascular;  Laterality: Left;  . Tubal ligation  1990's  . Cardiac catheterization  2014   HPI:  58 y/o with ESRD(on HD T/T/S). Her history includes tobacco abuse, liver transplant 2011 for Hep C, non obstructive coronary artery disease, obesity, anxiety, diabetes mellitus, hypertension, hyperlipidemia, and severe AS. She had a cath in Nov 2013 that showed no significant CAD, Patient was hospitalized in June 2014 with CHF exacerbation. She had cardiac catheterization on 07/29/2012 which was consistent with "moderate" aortic stenosis. She was admitted earlier this month with  bronchitis and HCAP.Today after dialysis she was hypotensive. Underwent AVR and CABG x1 on 2/3, extubated on 2/4.    Assessment / Plan / Recommendation Clinical Impression  Pt demonstrates only mild oral impairment due to missing denture. Expect that mastication will be adequate when daughter brings denture today. Vocal quality is clear 2 days following extubation. Pt demonstrated one instance of expectoration of mucous following thin liquids, but otherwise appeared WNL. Pt is safe to continue regular texture and thin liquids. No SLP f/u needed.     Aspiration Risk  Mild    Diet Recommendation Regular;Thin liquid   Liquid Administration via: Cup;Straw Medication Administration: Whole meds with liquid Supervision: Patient able to self feed Compensations: Slow rate;Small sips/bites Postural Changes and/or Swallow Maneuvers: Seated upright 90 degrees    Other  Recommendations Oral Care Recommendations: Oral care BID   Follow Up Recommendations  None    Frequency and Duration        Pertinent Vitals/Pain NA    SLP Swallow Goals     Swallow Study Prior Functional Status       General HPI: 58 y/o with ESRD(on HD T/T/S). Her history includes tobacco abuse, liver transplant 2011 for Hep C, non obstructive coronary artery disease, obesity, anxiety, diabetes mellitus, hypertension, hyperlipidemia, and severe AS. She had a cath in Nov 2013 that showed no significant CAD, Patient was hospitalized in June 2014 with CHF exacerbation. She had cardiac catheterization on 07/29/2012 which was consistent with "moderate" aortic stenosis. She was admitted earlier this month with bronchitis and HCAP.Today after dialysis she was hypotensive. Underwent AVR and CABG x1 on 2/3, extubated on 2/4.  Type of Study: Bedside swallow evaluation Diet Prior to this Study: Regular;Thin liquids Temperature Spikes Noted: No Respiratory Status: Room air History of Recent Intubation: Yes Length of Intubations  (days): 2 days Date extubated: 03/16/13 Behavior/Cognition: Alert;Cooperative;Pleasant mood Oral Cavity - Dentition: Dentures, not available Self-Feeding Abilities: Able to feed self Patient Positioning: Upright in chair Baseline Vocal Quality: Clear Volitional Cough: Strong Volitional Swallow: Able to elicit    Oral/Motor/Sensory Function Overall Oral Motor/Sensory Function: Appears within functional limits for tasks assessed   Ice Chips     Thin Liquid Thin Liquid: Within functional limits    Nectar Thick Nectar Thick Liquid: Not tested   Honey Thick Honey Thick Liquid: Not tested   Puree Puree: Within functional limits   Solid   GO    Solid: Impaired Presentation: Self Fed Oral Phase Impairments: Impaired mastication (top denture not available. )      Herbie Baltimore, MA CCC-SLP [phone]  Chevelle Coulson, Katherene Ponto 03/18/2013,8:59 AM

## 2013-03-18 NOTE — Progress Notes (Signed)
TCTS BRIEF SICU PROGRESS NOTE  3 Days Post-Op  S/P Procedure(s) (LRB): AORTIC VALVE REPLACEMENT (AVR) (N/A) CORONARY ARTERY BYPASS GRAFTING (CABG) times one using left internal mammary artery. (N/A) INTRAOPERATIVE TRANSESOPHAGEAL ECHOCARDIOGRAM (N/A)   Stable day NSR/sinus tach w/ stable BP on low dose dopamine Repeat Hgb 8.2  Plan: Continue current plan  Jillane Po H 03/18/2013 7:04 PM

## 2013-03-18 NOTE — Plan of Care (Signed)
Problem: Phase I - Pre-Op Goal: Point person for discharge identified Outcome: Not Met (add Reason) Need to speak with daughter.  Patient lives alone.  May need SNF upon discharge for rehab.  Will follow up with family when they come in.

## 2013-03-18 NOTE — Anesthesia Postprocedure Evaluation (Signed)
  Anesthesia Post-op Note  Patient: [name]  Procedure(s) Performed: Procedure(s) with comments: AORTIC VALVE REPLACEMENT (AVR) (N/A) CORONARY ARTERY BYPASS GRAFTING (CABG) times one using left internal mammary artery. (N/A) - POSS CABG X 1 INTRAOPERATIVE TRANSESOPHAGEAL ECHOCARDIOGRAM (N/A)  Patient Location: ICU  Anesthesia Type:General  Level of Consciousness: sedated  Airway and Oxygen Therapy: Patient remains intubated per anesthesia plan  Post-op Pain: none  Post-op Assessment: Post-op Vital signs reviewed, Patient's Cardiovascular Status Stable and Respiratory Function Stable  Post-op Vital Signs: Reviewed and stable  Complications: No apparent anesthesia complications

## 2013-03-18 NOTE — Progress Notes (Signed)
[name] ROUNDING NOTE   Subjective:   Interval History: sitting in chair, a little lethargic   Objective:  Vital signs in last 24 hours:  Temp:  [97.9 F (36.6 C)-98.9 F (37.2 C)] 98.9 F (37.2 C) (02/06 0733) Pulse Rate:  [35-155] 48 (02/06 0900) Resp:  [0-31] 0 (02/06 0900) BP: (93-166)/(43-76) 119/58 mmHg (02/06 0900) SpO2:  [92 %-100 %] 94 % (02/06 0900) Weight:  [83.5 kg (184 lb 1.4 oz)-84.3 kg (185 lb 13.6 oz)] 84.3 kg (185 lb 13.6 oz) (02/06 0600)  Weight change: -0.9 kg (-1 lb 15.7 oz) Filed Weights   03/17/13 0600 03/17/13 1625 03/18/13 0600  Weight: 84.4 kg (186 lb 1.1 oz) 83.5 kg (184 lb 1.4 oz) 84.3 kg (185 lb 13.6 oz)    Intake/Output: I/O last 3 completed shifts: In: 2297.7 [I.V.:2247.7; IV Piggyback:50] Out: 710 [Other:500; Chest Tube:210]   Intake/Output this shift:  Total I/O In: 122.6 [I.V.:122.6] Out: -   General appearance Alert and conversant  Resp: CTA without rales, rhonchi, or wheezes  Cardio: RRR with Gr II/VI systolic murmur, no rub  GI: + BS, soft  Extremities: No lower extremity edema  Access: L thigh AVG with + bruit     Basic Metabolic Panel:  Recent Labs Lab 03/14/13 1411 03/15/13 0300  03/15/13 2045  03/16/13 0410 03/16/13 0650 03/16/13 0946 03/16/13 1500 03/16/13 1729 03/17/13 0400 03/18/13 0355  NA 129* 137  < >  --   < > 135* 136*  --   --  136* 138 136*  K 4.9 4.4  < >  --   < > 6.3* 6.2* 5.8*  --  3.7 4.3 3.8  CL 87* 93*  --   --   < > 100 100  --   --  99 98 96  CO2 23 27  --   --   --  19 19  --   --   --  24 26  GLUCOSE 154* 130*  < >  --   < > 123* 149*  --   --  145* 168* 125*  BUN 44* 23  --   --   < > 32* 33*  --   --  9 16 12   CREATININE 9.26* 5.74*  --  5.49*  < > 6.03* 6.32*  --  2.20* 3.00* 3.95* 2.86*  CALCIUM 8.6 8.8  --   --   --  8.5 8.5  --   --   --  8.2* 8.0*  MG  --   --   --  2.0  --  2.0  --   --  1.9  --   --   --   PHOS 6.9*  --   --   --   --   --   --   --   --   --    --   --   < > = values in this interval not displayed.  Liver Function Tests:  Recent Labs Lab 03/13/13 0544 03/14/13 1411 03/16/13 1500 03/17/13 0400 03/18/13 0355  AST 26  --  110* 67* 35  ALT 18  --  42* 38* 28  ALKPHOS 66  --  64 64 84  BILITOT 0.3  --  0.4 0.4 0.5  PROT 6.5  --  5.6* 5.7* 5.7*  ALBUMIN 2.7* 2.8* 2.7* 2.4* 2.2*   No results found for this basename: LIPASE, AMYLASE,  in the last 168 hours No results found for  this basename: AMMONIA,  in the last 168 hours  CBC:  Recent Labs Lab 03/15/13 2045  03/16/13 0410 03/16/13 1500 03/16/13 1729 03/17/13 0400 03/18/13 0355  WBC 10.8*  --  16.1* 13.9*  --  11.7* 11.2*  HGB 9.4*  < > 9.9* 8.8* 8.8* 8.4* 8.4*  HCT 26.6*  < > 28.7* 25.7* 26.0* 24.8* 24.8*  MCV 89.6  --  90.5 91.5  --  92.9 92.9  PLT 171  --  197 144*  --  94* 96*  < > = values in this interval not displayed.  Cardiac Enzymes: No results found for this basename: CKTOTAL, CKMB, CKMBINDEX, TROPONINI,  in the last 168 hours  BNP: No components found with this basename: POCBNP,   CBG:  Recent Labs Lab 03/17/13 1553 03/17/13 1925 03/17/13 2345 03/18/13 0332 03/18/13 0735  GLUCAP 136* 92 79 116* 136*    Microbiology: Results for orders placed during the hospital encounter of 03/08/13  MRSA PCR SCREENING     Status: None   Collection Time    03/08/13  5:52 PM      Result Value Range Status   MRSA by PCR NEGATIVE  NEGATIVE Final   Comment:            The GeneXpert MRSA Assay (FDA     approved for NASAL specimens     only), is one component of a     comprehensive MRSA colonization     surveillance program. It is not     intended to diagnose MRSA     infection nor to guide or     monitor treatment for     MRSA infections.    Coagulation Studies:  Recent Labs  03/15/13 1400 03/16/13 0410 03/17/13 0400 03/18/13 0355  LABPROT 14.9 13.0 14.2 13.4  INR 1.20 1.00 1.12 1.04    Urinalysis: No results found for this basename:  COLORURINE, APPERANCEUR, LABSPEC, PHURINE, GLUCOSEU, HGBUR, BILIRUBINUR, KETONESUR, PROTEINUR, UROBILINOGEN, NITRITE, LEUKOCYTESUR,  in the last 72 hours    Imaging: Dg Chest Port 1 View  03/18/2013   CLINICAL DATA:  Aortic valve replacement.  EXAM: PORTABLE CHEST - 1 VIEW  COMPARISON:  03/17/2013  FINDINGS: Sequelae of aortic valve replacement are again identified. Bilateral chest tubes have been removed. Mediastinal drain has also been removed. Swan-Ganz catheter has been removed. Right subclavian Cordis remains in place with tip overlying the upper SVC. Left subclavian central venous catheter is again noted to be looped proximally with tip directed laterally over the SVC, unchanged.  The cardiomediastinal silhouette is unchanged. Lung volumes remain diminished with persistent bibasilar opacities, similar to the prior study. There may be small bilateral pleural effusions, unchanged. No definite pneumothorax is identified.  IMPRESSION: 1. Interval removal of support devices as above. 2. No definite pneumothorax identified. 3. Persistent low lung volumes with bibasilar opacities, suggestive of atelectasis.   Electronically Signed   By: Logan Bores   On: 03/18/2013 07:57   Dg Chest Port 1 View  03/17/2013   CLINICAL DATA:  Status post aortic valve replacement.  EXAM: PORTABLE CHEST - 1 VIEW  COMPARISON:  DG CHEST 1V PORT dated 03/16/2013; DG CHEST 1V PORT dated 03/15/2013  FINDINGS: Right-sided Swan-Ganz catheter. Likely looped in the pulmonary outflow tract.  Bilateral chest tubes are unchanged in position. Extubation and removal of nasogastric tube. Left subclavian line again is looped over the left clavicle with its tip at the high SVC. This may contact its lateral wall.  Patient rotated  right. Cardiomegaly accentuated by AP portable technique. Probable small bilateral pleural effusions, new. No pneumothorax. Decreased lung volumes with new bibasilar airspace disease.  IMPRESSION: Extubation with diminished  lung volumes and development of bibasilar atelectasis.  Probable small bilateral pleural effusions.  Similar appearance and course of the left subclavian line. Please see yesterday's report for further discussion.   Electronically Signed   By: Abigail Miyamoto M.D.   On: 03/17/2013 08:10     Medications:   . sodium chloride 20 mL/hr (03/17/13 1000)  . sodium chloride 20 mL/hr at 03/17/13 0700  . sodium chloride Stopped (03/17/13 0700)  . DOPamine 3 mcg/kg/min (03/18/13 0800)  . phenylephrine (NEO-SYNEPHRINE) Adult infusion 20 mcg/min (03/18/13 0800)   . aspirin EC  325 mg Oral Daily   Or  . aspirin  324 mg Per Tube Daily  . bisacodyl  10 mg Oral Daily   Or  . bisacodyl  10 mg Rectal Daily  . budesonide-formoterol  2 puff Inhalation BID  . calcium carbonate  1 tablet Oral Daily  . cinacalcet  30 mg Oral Q breakfast  . docusate sodium  200 mg Oral Daily  . doxercalciferol  4 mcg Intravenous Q T,Th,Sa-HD  . DULoxetine  60 mg Oral QHS  . gabapentin  300 mg Oral QHS  . insulin aspart  0-24 Units Subcutaneous Q4H  . levalbuterol  1.25 mg Nebulization Q6H  . levothyroxine  150 mcg Oral QAC breakfast  . metoprolol tartrate  12.5 mg Oral BID   Or  . metoprolol tartrate  12.5 mg Per Tube BID  . multivitamin with minerals  1 tablet Oral Daily  . nicotine  7 mg Transdermal Daily  . pantoprazole  40 mg Oral Daily  . sodium chloride  10-40 mL Intravenous Q12H  . sodium chloride  3 mL Intravenous Q12H  . tacrolimus  5 mg Oral BID  . warfarin  2.5 mg Oral q1800  . Warfarin - Physician Dosing Inpatient   Does not apply q1800   sodium chloride, feeding supplement (NEPRO CARB STEADY), heparin, lidocaine (PF), lidocaine-prilocaine, metoprolol, morphine injection, ondansetron (ZOFRAN) IV, oxyCODONE, pentafluoroprop-tetrafluoroeth, sodium chloride, sodium chloride    Assessment/ Plan:   Severe aortic valve stenosis/CAD - aortic valve replacement & CABG Day 4 per Dr. Prescott Gum, stable  post-surgery . 1. ESRD - HD on TTS Hyperkalemia resolved plan HD tomorrow 2. HTN/Volume - plan 0-1 L off today  3. Anemia - Hgb 8.4 , stable. No heparin  4. 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  5. DM - insulin per primary.  6. Liver transplant - on Prograf    LOS: 10 [name] W @TODAY @10 :05 AM

## 2013-03-19 ENCOUNTER — Inpatient Hospital Stay (HOSPITAL_COMMUNITY): Payer: Medicare Other

## 2013-03-19 LAB — COMPREHENSIVE METABOLIC PANEL
ALT: 20 U/L (ref 0–35)
AST: 22 U/L (ref 0–37)
Albumin: 2 g/dL — ABNORMAL LOW (ref 3.5–5.2)
Alkaline Phosphatase: 78 U/L (ref 39–117)
BUN: 25 mg/dL — ABNORMAL HIGH (ref 6–23)
CO2: 27 mEq/L (ref 19–32)
Calcium: 8.1 mg/dL — ABNORMAL LOW (ref 8.4–10.5)
Chloride: 95 mEq/L — ABNORMAL LOW (ref 96–112)
Creatinine, Ser: 4.61 mg/dL — ABNORMAL HIGH (ref 0.50–1.10)
GFR calc Af Amer: 11 mL/min — ABNORMAL LOW (ref >90)
GFR calc non Af Amer: 10 mL/min — ABNORMAL LOW (ref >90)
Glucose, Bld: 130 mg/dL — ABNORMAL HIGH (ref 70–99)
Potassium: 3.5 mEq/L — ABNORMAL LOW (ref 3.7–5.3)
Sodium: 136 mEq/L — ABNORMAL LOW (ref 137–147)
Total Bilirubin: 0.3 mg/dL (ref 0.3–1.2)
Total Protein: 5.5 g/dL — ABNORMAL LOW (ref 6.0–8.3)

## 2013-03-19 LAB — POCT I-STAT 3, ART BLOOD GAS (G3+)
Acid-Base Excess: 6 mmol/L — ABNORMAL HIGH (ref 0.0–2.0)
BICARBONATE: 30.5 meq/L — AB (ref 20.0–24.0)
O2 Saturation: 97 %
TCO2: 32 mmol/L (ref 0–100)
pCO2 arterial: 41.7 mmHg (ref 35.0–45.0)
pH, Arterial: 7.47 — ABNORMAL HIGH (ref 7.350–7.450)
pO2, Arterial: 81 mmHg (ref 80.0–100.0)

## 2013-03-19 LAB — CBC
HCT: 22.5 % — ABNORMAL LOW (ref 36.0–46.0)
Hemoglobin: 7.5 g/dL — ABNORMAL LOW (ref 12.0–15.0)
MCH: 31.3 pg (ref 26.0–34.0)
MCHC: 33.3 g/dL (ref 30.0–36.0)
MCV: 93.8 fL (ref 78.0–100.0)
Platelets: 96 10*3/uL — ABNORMAL LOW (ref 150–400)
RBC: 2.4 MIL/uL — ABNORMAL LOW (ref 3.87–5.11)
RDW: 15.2 % (ref 11.5–15.5)
WBC: 8.1 10*3/uL (ref 4.0–10.5)

## 2013-03-19 LAB — GLUCOSE, CAPILLARY
Glucose-Capillary: 136 mg/dL — ABNORMAL HIGH (ref 70–99)
Glucose-Capillary: 126 mg/dL — ABNORMAL HIGH (ref 70–99)
Glucose-Capillary: 115 mg/dL — ABNORMAL HIGH (ref 70–99)
Glucose-Capillary: 120 mg/dL — ABNORMAL HIGH (ref 70–99)
Glucose-Capillary: 104 mg/dL — ABNORMAL HIGH (ref 70–99)
Glucose-Capillary: 151 mg/dL — ABNORMAL HIGH (ref 70–99)

## 2013-03-19 LAB — PROTIME-INR
INR: 1.09 (ref 0.00–1.49)
Prothrombin Time: 13.9 seconds (ref 11.6–15.2)

## 2013-03-19 MED ORDER — INSULIN ASPART 100 UNIT/ML ~~LOC~~ SOLN
0.0000 [IU] | SUBCUTANEOUS | Status: DC
  Administered 2013-03-19: 2 [IU] via SUBCUTANEOUS
  Administered 2013-03-20 (×2): 4 [IU] via SUBCUTANEOUS
  Administered 2013-03-20: 2 [IU] via SUBCUTANEOUS
  Administered 2013-03-20: 8 [IU] via SUBCUTANEOUS
  Administered 2013-03-20 (×2): 4 [IU] via SUBCUTANEOUS

## 2013-03-19 NOTE — Progress Notes (Addendum)
      [address]       [place] [address]             [phone]        CARDIOTHORACIC SURGERY PROGRESS NOTE   R4 Days Post-Op Procedure(s) (LRB): AORTIC VALVE REPLACEMENT (AVR) (N/A) CORONARY ARTERY BYPASS GRAFTING (CABG) times one using left internal mammary artery. (N/A) INTRAOPERATIVE TRANSESOPHAGEAL ECHOCARDIOGRAM (N/A)  Subjective: Feels "tired" this morning.  Some soreness in chest.  Denies SOB  Objective: Vital signs: BP Readings from Last 1 Encounters:  03/19/13 92/50   Pulse Readings from Last 1 Encounters:  03/19/13 90   Resp Readings from Last 1 Encounters:  03/19/13 24   Temp Readings from Last 1 Encounters:  03/19/13 97.7 F (36.5 C) Oral    Hemodynamics:    Physical Exam:  Rhythm:   sinus  Breath sounds: clear  Heart sounds:  RRR  Incisions:  Clean and dry  Abdomen:  Soft, non-distended, non-tender  Extremities:  Warm, well-perfused    Intake/Output from previous day: 02/06 0701 - 02/07 0700 In: 1807.7 [P.O.:480; I.V.:1327.7] Out: -  Intake/Output this shift: Total I/O In: 44.7 [I.V.:44.7] Out: -   Lab Results:  CBC: Recent Labs  03/18/13 1500 03/19/13 0436  WBC 11.6* 8.1  HGB 8.2* 7.5*  HCT 23.7* 22.5*  PLT 120* 96*    BMET:  Recent Labs  03/18/13 0355 03/19/13 0436  NA 136* 136*  K 3.8 3.5*  CL 96 95*  CO2 26 27  GLUCOSE 125* 130*  BUN 12 25*  CREATININE 2.86* 4.61*  CALCIUM 8.0* 8.1*     CBG (last 3)   Recent Labs  03/18/13 1933 03/18/13 2354 03/19/13 0358  GLUCAP 181* 136* 126*    ABG    Component Value Date/Time   PHART 7.416 03/17/2013 0409   PCO2ART 40.3 03/17/2013 0409   PO2ART 55.0* 03/17/2013 0409   HCO3 25.8* 03/17/2013 0409   TCO2 27 03/17/2013 0409   ACIDBASEDEF 6.0* 03/16/2013 0912   O2SAT 89.0 03/17/2013 0409    CXR: CLINICAL DATA: 58 year old female aortic valve replacement. Initial  encounter.  EXAM:  PORTABLE CHEST - 1 VIEW  COMPARISON: 03/18/2013 and earlier.    FINDINGS:  Portable AP semi upright view at 0522 hrs. Improved lung volumes.  Stable cardiac size and mediastinal contours. Increased veiling  opacity on the left. No significant change in mild patchy right  basilar opacity. No pneumothorax. Stable bilateral subclavian  catheters.  IMPRESSION:  1. Increased veiling opacity on the left compatible with pleural  effusion. Associated lower lobe collapse or consolidation.  2. Mildly improved lung volumes.  Electronically Signed  By: Lars Pinks M.D.  On: 03/19/2013 07:03    Assessment/Plan: S/P Procedure(s) (LRB): AORTIC VALVE REPLACEMENT (AVR) (N/A) CORONARY ARTERY BYPASS GRAFTING (CABG) times one using left internal mammary artery. (N/A) INTRAOPERATIVE TRANSESOPHAGEAL ECHOCARDIOGRAM (N/A)  Overall stable POD4 AVR w/ mechanical prosthesis + CABG Maintaining NSR w/ stable BP on low dose dopamine Expected post op acute blood loss anemia with chronic anemia, worse overnight Expected post op volume excess, moderate, weight 8-10 kg > baseline Expected post op atelectasis, moderate Left pleural effusion, small to moderate ESRD on dialysis S/P OLT Type II diabetes mellitus, good glycemic control   Transfuse 1 unit PRBC's during HD  Wean dopamine as tolerated  Mobilize, PT  Volume management via HD  Continue coumadin 2.5 mg daily for now  [facility] H 03/19/2013 9:24 AM

## 2013-03-19 NOTE — Progress Notes (Signed)
[facility] ROUNDING NOTE   Subjective:   Interval History:somnolent this morning , sitting out of bed but wants to lie down. Not taking deep breaths when asked to.   Objective:  Vital signs in last 24 hours:  Temp:  [97.7 F (36.5 C)-99 F (37.2 C)] 97.8 F (36.6 C) (02/07 1255) Pulse Rate:  [84-100] 86 (02/07 1200) Resp:  [0-30] 12 (02/07 1200) BP: (89-138)/(24-71) 108/52 mmHg (02/07 1200) SpO2:  [89 %-100 %] 95 % (02/07 1200) Weight:  [84.5 kg (186 lb 4.6 oz)] 84.5 kg (186 lb 4.6 oz) (02/07 0500)  Weight change: 1 kg (2 lb 3.3 oz) Filed Weights   03/17/13 1625 03/18/13 0600 03/19/13 0500  Weight: 83.5 kg (184 lb 1.4 oz) 84.3 kg (185 lb 13.6 oz) 84.5 kg (186 lb 4.6 oz)    Intake/Output: I/O last 3 completed shifts: In: 2454.4 [P.O.:480; I.V.:1974.4] Out: 500 [Other:500]   Intake/Output this shift:  Total I/O In: 183.5 [P.O.:120; I.V.:63.5] Out: -   General appearance Alert and conversant  Resp: CTA without rales, rhonchi, or wheezes  Cardio: RRR with Gr II/VI systolic murmur, no rub  GI: + BS, soft  Extremities: No lower extremity edema  Access: L thigh AVG with + bruit    Basic Metabolic Panel:  Recent Labs Lab 03/14/13 1411  03/15/13 2045  03/16/13 0410 03/16/13 0650 03/16/13 0946 03/16/13 1500 03/16/13 1729 03/17/13 0400 03/18/13 0355 03/19/13 0436  NA 129*  < >  --   < > 135* 136*  --   --  136* 138 136* 136*  K 4.9  < >  --   < > 6.3* 6.2* 5.8*  --  3.7 4.3 3.8 3.5*  CL 87*  < >  --   < > 100 100  --   --  99 98 96 95*  CO2 23  < >  --   --  19 19  --   --   --  24 26 27   GLUCOSE 154*  < >  --   < > 123* 149*  --   --  145* 168* 125* 130*  BUN 44*  < >  --   < > 32* 33*  --   --  9 16 12  25*  CREATININE 9.26*  < > 5.49*  < > 6.03* 6.32*  --  2.20* 3.00* 3.95* 2.86* 4.61*  CALCIUM 8.6  < >  --   --  8.5 8.5  --   --   --  8.2* 8.0* 8.1*  MG  --   --  2.0  --  2.0  --   --  1.9  --   --   --   --   PHOS 6.9*  --   --   --   --   --    --   --   --   --   --   --   < > = values in this interval not displayed.  Liver Function Tests:  Recent Labs Lab 03/13/13 0544 03/14/13 1411 03/16/13 1500 03/17/13 0400 03/18/13 0355 03/19/13 0436  AST 26  --  110* 67* 35 22  ALT 18  --  42* 38* 28 20  ALKPHOS 66  --  64 64 84 78  BILITOT 0.3  --  0.4 0.4 0.5 0.3  PROT 6.5  --  5.6* 5.7* 5.7* 5.5*  ALBUMIN 2.7* 2.8* 2.7* 2.4* 2.2* 2.0*   No results found for this  basename: LIPASE, AMYLASE,  in the last 168 hours No results found for this basename: AMMONIA,  in the last 168 hours  CBC:  Recent Labs Lab 03/16/13 1500 03/16/13 1729 03/17/13 0400 03/18/13 0355 03/18/13 1500 03/19/13 0436  WBC 13.9*  --  11.7* 11.2* 11.6* 8.1  HGB 8.8* 8.8* 8.4* 8.4* 8.2* 7.5*  HCT 25.7* 26.0* 24.8* 24.8* 23.7* 22.5*  MCV 91.5  --  92.9 92.9 93.7 93.8  PLT 144*  --  94* 96* 120* 96*    Cardiac Enzymes: No results found for this basename: CKTOTAL, CKMB, CKMBINDEX, TROPONINI,  in the last 168 hours  BNP: No components found with this basename: POCBNP,   CBG:  Recent Labs Lab 03/18/13 1933 03/18/13 2354 03/19/13 0358 03/19/13 0744 03/19/13 1253  GLUCAP 181* 136* 126* 120* 115*    Microbiology: Results for orders placed during the hospital encounter of 03/08/13  MRSA PCR SCREENING     Status: None   Collection Time    03/08/13  5:52 PM      Result Value Range Status   MRSA by PCR NEGATIVE  NEGATIVE Final   Comment:            The GeneXpert MRSA Assay (FDA     approved for NASAL specimens     only), is one component of a     comprehensive MRSA colonization     surveillance program. It is not     intended to diagnose MRSA     infection nor to guide or     monitor treatment for     MRSA infections.    Coagulation Studies:  Recent Labs  03/17/13 0400 03/18/13 0355 03/19/13 0436  LABPROT 14.2 13.4 13.9  INR 1.12 1.04 1.09    Urinalysis: No results found for this basename: COLORURINE, APPERANCEUR, LABSPEC,  PHURINE, GLUCOSEU, HGBUR, BILIRUBINUR, KETONESUR, PROTEINUR, UROBILINOGEN, NITRITE, LEUKOCYTESUR,  in the last 72 hours    Imaging: Dg Chest Port 1 View  03/19/2013   CLINICAL DATA:  58 year old female aortic valve replacement. Initial encounter.  EXAM: PORTABLE CHEST - 1 VIEW  COMPARISON:  03/18/2013 and earlier.  FINDINGS: Portable AP semi upright view at 0522 hrs. Improved lung volumes. Stable cardiac size and mediastinal contours. Increased veiling opacity on the left. No significant change in mild patchy right basilar opacity. No pneumothorax. Stable bilateral subclavian catheters.  IMPRESSION: 1. Increased veiling opacity on the left compatible with pleural effusion. Associated lower lobe collapse or consolidation. 2. Mildly improved lung volumes.   Electronically Signed   By: Lars Pinks M.D.   On: 03/19/2013 07:03   Dg Chest Port 1 View  03/18/2013   CLINICAL DATA:  Aortic valve replacement.  EXAM: PORTABLE CHEST - 1 VIEW  COMPARISON:  03/17/2013  FINDINGS: Sequelae of aortic valve replacement are again identified. Bilateral chest tubes have been removed. Mediastinal drain has also been removed. Swan-Ganz catheter has been removed. Right subclavian Cordis remains in place with tip overlying the upper SVC. Left subclavian central venous catheter is again noted to be looped proximally with tip directed laterally over the SVC, unchanged.  The cardiomediastinal silhouette is unchanged. Lung volumes remain diminished with persistent bibasilar opacities, similar to the prior study. There may be small bilateral pleural effusions, unchanged. No definite pneumothorax is identified.  IMPRESSION: 1. Interval removal of support devices as above. 2. No definite pneumothorax identified. 3. Persistent low lung volumes with bibasilar opacities, suggestive of atelectasis.   Electronically Signed   By: Zenia Resides  Jeralyn Ruths   On: 03/18/2013 07:57     Medications:   . sodium chloride Stopped (03/17/13 0700)  . DOPamine 3  mcg/kg/min (03/19/13 1200)   . aspirin EC  325 mg Oral Daily  . bisacodyl  10 mg Oral Daily   Or  . bisacodyl  10 mg Rectal Daily  . budesonide-formoterol  2 puff Inhalation BID  . calcium carbonate  1 tablet Oral Daily  . cinacalcet  30 mg Oral Q breakfast  . docusate sodium  200 mg Oral Daily  . doxercalciferol  4 mcg Intravenous Q T,Th,Sa-HD  . DULoxetine  60 mg Oral QHS  . feeding supplement (NEPRO CARB STEADY)  237 mL Oral BID BM  . gabapentin  300 mg Oral QHS  . insulin aspart  0-24 Units Subcutaneous Q4H  . levalbuterol  1.25 mg Nebulization Q6H  . levothyroxine  150 mcg Oral QAC breakfast  . metoprolol tartrate  12.5 mg Oral BID  . multivitamin with minerals  1 tablet Oral Daily  . nicotine  7 mg Transdermal Daily  . pantoprazole  40 mg Oral Daily  . sodium chloride  10-40 mL Intravenous Q12H  . sodium chloride  3 mL Intravenous Q12H  . tacrolimus  5 mg Oral BID  . warfarin  2.5 mg Oral q1800  . Warfarin - Physician Dosing Inpatient   Does not apply q1800   sodium chloride, feeding supplement (NEPRO CARB STEADY), heparin, lidocaine (PF), lidocaine-prilocaine, metoprolol, morphine injection, ondansetron (ZOFRAN) IV, oxyCODONE, pentafluoroprop-tetrafluoroeth, sodium chloride, sodium chloride  Assessment/ Plan:   Assessment/ Plan:   Severe aortic valve stenosis/CAD - aortic valve replacement & CABG Day 5 per Dr. Prescott Gum, stable post-surgery  .  1. ESRD - HD on TTS Hyperkalemia resolved plan HD today 2. HTN/Volume - plan 0-1 L will need to gently remove fluid although hypotension may be limiting  3. Anemia - Hgb 7's transfusion ordered with dialysis 4. Sec HPT - Ca 8.1; Sensipar, no binders. Corrected calcium 9.4 5. DM - insulin per primary.  6. Liver transplant - on Prograf        LOS: 11 [name] @TODAY @1 :07 PM

## 2013-03-19 NOTE — Progress Notes (Signed)
[facility] BRIEF SICU PROGRESS NOTE  4 Days Post-Op  S/P Procedure(s) (LRB): AORTIC VALVE REPLACEMENT (AVR) (N/A) CORONARY ARTERY BYPASS GRAFTING (CABG) times one using left internal mammary artery. (N/A) INTRAOPERATIVE TRANSESOPHAGEAL ECHOCARDIOGRAM (N/A)   Overall stable day Currently on HD, tolerating it well More alert this afternoon  Plan: Continue current plan  Gentle Hoge H 03/19/2013 5:21 PM

## 2013-03-20 LAB — RENAL FUNCTION PANEL
Albumin: 2.2 g/dL — ABNORMAL LOW (ref 3.5–5.2)
BUN: 18 mg/dL (ref 6–23)
CALCIUM: 8.6 mg/dL (ref 8.4–10.5)
CO2: 29 mEq/L (ref 19–32)
CREATININE: 3.29 mg/dL — AB (ref 0.50–1.10)
Chloride: 96 mEq/L (ref 96–112)
GFR calc Af Amer: 17 mL/min — ABNORMAL LOW (ref >90)
GFR calc non Af Amer: 14 mL/min — ABNORMAL LOW (ref >90)
Glucose, Bld: 138 mg/dL — ABNORMAL HIGH (ref 70–99)
PHOSPHORUS: 2.9 mg/dL (ref 2.3–4.6)
Potassium: 3.6 mEq/L — ABNORMAL LOW (ref 3.7–5.3)
Sodium: 138 mEq/L (ref 137–147)

## 2013-03-20 LAB — TYPE AND SCREEN
ABO/RH(D): A POS
Antibody Screen: NEGATIVE
Unit division: 0

## 2013-03-20 LAB — CBC
HEMATOCRIT: 28 % — AB (ref 36.0–46.0)
HEMOGLOBIN: 9.5 g/dL — AB (ref 12.0–15.0)
MCH: 31.7 pg (ref 26.0–34.0)
MCHC: 33.9 g/dL (ref 30.0–36.0)
MCV: 93.3 fL (ref 78.0–100.0)
Platelets: 99 10*3/uL — ABNORMAL LOW (ref 150–400)
RBC: 3 MIL/uL — ABNORMAL LOW (ref 3.87–5.11)
RDW: 15.2 % (ref 11.5–15.5)
WBC: 8.8 10*3/uL (ref 4.0–10.5)

## 2013-03-20 LAB — GLUCOSE, CAPILLARY
GLUCOSE-CAPILLARY: 119 mg/dL — AB (ref 70–99)
GLUCOSE-CAPILLARY: 180 mg/dL — AB (ref 70–99)
Glucose-Capillary: 165 mg/dL — ABNORMAL HIGH (ref 70–99)
Glucose-Capillary: 177 mg/dL — ABNORMAL HIGH (ref 70–99)

## 2013-03-20 LAB — PROTIME-INR
INR: 0.89 (ref 0.00–1.49)
Prothrombin Time: 11.9 seconds (ref 11.6–15.2)

## 2013-03-20 MED ORDER — LEVALBUTEROL HCL 1.25 MG/0.5ML IN NEBU
1.2500 mg | INHALATION_SOLUTION | Freq: Four times a day (QID) | RESPIRATORY_TRACT | Status: DC | PRN
  Filled 2013-03-20: qty 0.5

## 2013-03-20 MED ORDER — BIOTENE DRY MOUTH MT LIQD
15.0000 mL | Freq: Two times a day (BID) | OROMUCOSAL | Status: DC
  Administered 2013-03-20 – 2013-03-21 (×3): 15 mL via OROMUCOSAL

## 2013-03-20 MED ORDER — METOPROLOL TARTRATE 25 MG PO TABS
25.0000 mg | ORAL_TABLET | Freq: Two times a day (BID) | ORAL | Status: DC
  Administered 2013-03-20 – 2013-03-29 (×15): 25 mg via ORAL
  Filled 2013-03-20 (×20): qty 1

## 2013-03-20 MED ORDER — WARFARIN SODIUM 5 MG PO TABS
5.0000 mg | ORAL_TABLET | Freq: Every day | ORAL | Status: DC
  Administered 2013-03-20 – 2013-03-22 (×3): 5 mg via ORAL
  Filled 2013-03-20 (×5): qty 1

## 2013-03-20 NOTE — Progress Notes (Addendum)
      [address]       [place] [address]             [phone]        CARDIOTHORACIC SURGERY PROGRESS NOTE   R5 Days Post-Op Procedure(s) (LRB): AORTIC VALVE REPLACEMENT (AVR) (N/A) CORONARY ARTERY BYPASS GRAFTING (CABG) times one using left internal mammary artery. (N/A) INTRAOPERATIVE TRANSESOPHAGEAL ECHOCARDIOGRAM (N/A)  Subjective: Awake and alert.  Denies pain and SOB.  Not hungry for breakfast.  Reportedly has been intermittently confused with occasional hallucinations.  Objective: Vital signs: BP Readings from Last 1 Encounters:  03/20/13 156/61   Pulse Readings from Last 1 Encounters:  03/20/13 91   Resp Readings from Last 1 Encounters:  03/20/13 17   Temp Readings from Last 1 Encounters:  03/20/13 97.3 F (36.3 C) Oral    Hemodynamics:    Physical Exam:  Rhythm:   sinus  Breath sounds: clear  Heart sounds:  RRR  Incisions:  Clean and dry  Abdomen:  Soft, non-distended, non-tender  Extremities:  Warm, well-perfused    Intake/Output from previous day: 02/07 0701 - 02/08 0700 In: 1469.7 [P.O.:680; I.V.:464.7; Blood:325] Out: 1550  Intake/Output this shift:    Lab Results:  CBC: Recent Labs  03/19/13 0436 03/20/13 0355  WBC 8.1 8.8  HGB 7.5* 9.5*  HCT 22.5* 28.0*  PLT 96* 99*    BMET:  Recent Labs  03/19/13 0436 03/20/13 0355  NA 136* 138  K 3.5* 3.6*  CL 95* 96  CO2 27 29  GLUCOSE 130* 138*  BUN 25* 18  CREATININE 4.61* 3.29*  CALCIUM 8.1* 8.6     CBG (last 3)   Recent Labs  03/19/13 1253 03/19/13 1635 03/19/13 2003  GLUCAP 115* 104* 151*    ABG    Component Value Date/Time   PHART 7.470* 03/19/2013 1455   PCO2ART 41.7 03/19/2013 1455   PO2ART 81.0 03/19/2013 1455   HCO3 30.5* 03/19/2013 1455   TCO2 32 03/19/2013 1455   ACIDBASEDEF 6.0* 03/16/2013 0912   O2SAT 97.0 03/19/2013 1455   Results for GALAXY, BORDEN (MRN [id number]) as of 03/20/2013 09:35  Ref. Range 03/20/2013 03:55  Prothrombin Time Latest  Range: 11.6-15.2 seconds 11.9  INR Latest Range: 0.00-1.49  0.89     CXR: n/a  Assessment/Plan: S/P Procedure(s) (LRB): AORTIC VALVE REPLACEMENT (AVR) (N/A) CORONARY ARTERY BYPASS GRAFTING (CABG) times one using left internal mammary artery. (N/A) INTRAOPERATIVE TRANSESOPHAGEAL ECHOCARDIOGRAM (N/A)  Overall stable POD5 AVR w/ mechanical prosthesis + CABG  Maintaining NSR-sinus tach w/ stable BP, now somewhat hypertensive Expected post op acute blood loss anemia with chronic anemia, improved following transfusion yesterday Expected post op volume excess, moderate, weight 8-10 kg > baseline  Expected post op atelectasis, moderate  Left pleural effusion, small to moderate  ESRD on dialysis  S/P OLT  Type II diabetes mellitus, good glycemic control   Mobilize, PT Will increase metoprolol  Volume management via HD  Continue coumadin, will give 5 mg today   Tirzah Fross H 03/20/2013 9:31 AM

## 2013-03-20 NOTE — Progress Notes (Signed)
[facility] BRIEF SICU PROGRESS NOTE  5 Days Post-Op  S/P Procedure(s) (LRB): AORTIC VALVE REPLACEMENT (AVR) (N/A) CORONARY ARTERY BYPASS GRAFTING (CABG) times one using left internal mammary artery. (N/A) INTRAOPERATIVE TRANSESOPHAGEAL ECHOCARDIOGRAM (N/A)   Stable day  Plan: Continue current plan  Rexene Alberts 03/20/2013 6:43 PM

## 2013-03-20 NOTE — Progress Notes (Signed)
[facility] ROUNDING NOTE   Subjective:   Interval History: alert but confused at times appears drowsy    Objective:  Vital signs in last 24 hours:  Temp:  [96.1 F (35.6 C)-98.7 F (37.1 C)] 97.3 F (36.3 C) (02/08 0823) Pulse Rate:  [83-99] 91 (02/08 0600) Resp:  [12-26] 17 (02/08 0800) BP: (103-188)/(48-147) 156/61 mmHg (02/08 0800) SpO2:  [88 %-100 %] 100 % (02/08 0825) Weight:  [84.4 kg (186 lb 1.1 oz)-86.3 kg (190 lb 4.1 oz)] 84.4 kg (186 lb 1.1 oz) (02/08 0600)  Weight change: 1.8 kg (3 lb 15.5 oz) Filed Weights   03/19/13 1400 03/19/13 1815 03/20/13 0600  Weight: 86.3 kg (190 lb 4.1 oz) 84.8 kg (186 lb 15.2 oz) 84.4 kg (186 lb 1.1 oz)    Intake/Output: I/O last 3 completed shifts: In: 2105.2 [P.O.:680; I.V.:1100.2; Blood:325] Out: 7253 [Other:1550]   Intake/Output this shift:    General appearance somewhat lethargic Resp: CTA without rales, rhonchi, or wheezes  Cardio: RRR with Gr II/VI systolic murmur, no rub  GI: + BS, soft  Extremities: No lower extremity edema  Access: L thigh AVG with + bruit     Basic Metabolic Panel:  Recent Labs Lab 03/14/13 1411  03/15/13 2045  03/16/13 0410 03/16/13 0650  03/16/13 1500 03/16/13 1729 03/17/13 0400 03/18/13 0355 03/19/13 0436 03/20/13 0355  NA 129*  < >  --   < > 135* 136*  --   --  136* 138 136* 136* 138  K 4.9  < >  --   < > 6.3* 6.2*  < >  --  3.7 4.3 3.8 3.5* 3.6*  CL 87*  < >  --   < > 100 100  --   --  99 98 96 95* 96  CO2 23  < >  --   --  19 19  --   --   --  24 26 27 29   GLUCOSE 154*  < >  --   < > 123* 149*  --   --  145* 168* 125* 130* 138*  BUN 44*  < >  --   < > 32* 33*  --   --  9 16 12  25* 18  CREATININE 9.26*  < > 5.49*  < > 6.03* 6.32*  --  2.20* 3.00* 3.95* 2.86* 4.61* 3.29*  CALCIUM 8.6  < >  --   --  8.5 8.5  --   --   --  8.2* 8.0* 8.1* 8.6  MG  --   --  2.0  --  2.0  --   --  1.9  --   --   --   --   --   PHOS 6.9*  --   --   --   --   --   --   --   --   --   --    --  2.9  < > = values in this interval not displayed.  Liver Function Tests:  Recent Labs Lab 03/16/13 1500 03/17/13 0400 03/18/13 0355 03/19/13 0436 03/20/13 0355  AST 110* 67* 35 22  --   ALT 42* 38* 28 20  --   ALKPHOS 64 64 84 78  --   BILITOT 0.4 0.4 0.5 0.3  --   PROT 5.6* 5.7* 5.7* 5.5*  --   ALBUMIN 2.7* 2.4* 2.2* 2.0* 2.2*   No results found for this basename: LIPASE, AMYLASE,  in the last  168 hours No results found for this basename: AMMONIA,  in the last 168 hours  CBC:  Recent Labs Lab 03/17/13 0400 03/18/13 0355 03/18/13 1500 03/19/13 0436 03/20/13 0355  WBC 11.7* 11.2* 11.6* 8.1 8.8  HGB 8.4* 8.4* 8.2* 7.5* 9.5*  HCT 24.8* 24.8* 23.7* 22.5* 28.0*  MCV 92.9 92.9 93.7 93.8 93.3  PLT 94* 96* 120* 96* 99*    Cardiac Enzymes: No results found for this basename: CKTOTAL, CKMB, CKMBINDEX, TROPONINI,  in the last 168 hours  BNP: No components found with this basename: POCBNP,   CBG:  Recent Labs Lab 03/19/13 0358 03/19/13 0744 03/19/13 1253 03/19/13 1635 03/19/13 2003  GLUCAP 126* 120* 115* 104* 151*    Microbiology: Results for orders placed during the hospital encounter of 03/08/13  MRSA PCR SCREENING     Status: None   Collection Time    03/08/13  5:52 PM      Result Value Range Status   MRSA by PCR NEGATIVE  NEGATIVE Final   Comment:            The GeneXpert MRSA Assay (FDA     approved for NASAL specimens     only), is one component of a     comprehensive MRSA colonization     surveillance program. It is not     intended to diagnose MRSA     infection nor to guide or     monitor treatment for     MRSA infections.    Coagulation Studies:  Recent Labs  03/18/13 0355 03/19/13 0436 03/20/13 0355  LABPROT 13.4 13.9 11.9  INR 1.04 1.09 0.89    Urinalysis: No results found for this basename: COLORURINE, APPERANCEUR, LABSPEC, PHURINE, GLUCOSEU, HGBUR, BILIRUBINUR, KETONESUR, PROTEINUR, UROBILINOGEN, NITRITE, LEUKOCYTESUR,  in  the last 72 hours    Imaging: Dg Chest Port 1 View  03/19/2013   CLINICAL DATA:  58 year old female aortic valve replacement. Initial encounter.  EXAM: PORTABLE CHEST - 1 VIEW  COMPARISON:  03/18/2013 and earlier.  FINDINGS: Portable AP semi upright view at 0522 hrs. Improved lung volumes. Stable cardiac size and mediastinal contours. Increased veiling opacity on the left. No significant change in mild patchy right basilar opacity. No pneumothorax. Stable bilateral subclavian catheters.  IMPRESSION: 1. Increased veiling opacity on the left compatible with pleural effusion. Associated lower lobe collapse or consolidation. 2. Mildly improved lung volumes.   Electronically Signed   By: Lars Pinks M.D.   On: 03/19/2013 07:03     Medications:   . sodium chloride Stopped (03/20/13 0700)   . aspirin EC  325 mg Oral Daily  . bisacodyl  10 mg Oral Daily   Or  . bisacodyl  10 mg Rectal Daily  . budesonide-formoterol  2 puff Inhalation BID  . calcium carbonate  1 tablet Oral Daily  . cinacalcet  30 mg Oral Q breakfast  . docusate sodium  200 mg Oral Daily  . doxercalciferol  4 mcg Intravenous Q T,Th,Sa-HD  . DULoxetine  60 mg Oral QHS  . feeding supplement (NEPRO CARB STEADY)  237 mL Oral BID BM  . gabapentin  300 mg Oral QHS  . insulin aspart  0-24 Units Subcutaneous Q4H  . levalbuterol  1.25 mg Nebulization Q6H  . levothyroxine  150 mcg Oral QAC breakfast  . metoprolol tartrate  25 mg Oral BID  . multivitamin with minerals  1 tablet Oral Daily  . nicotine  7 mg Transdermal Daily  . pantoprazole  40 mg  Oral Daily  . sodium chloride  10-40 mL Intravenous Q12H  . tacrolimus  5 mg Oral BID  . warfarin  5 mg Oral q1800  . Warfarin - Physician Dosing Inpatient   Does not apply q1800   sodium chloride, feeding supplement (NEPRO CARB STEADY), heparin, lidocaine (PF), lidocaine-prilocaine, metoprolol, morphine injection, ondansetron (ZOFRAN) IV, oxyCODONE, pentafluoroprop-tetrafluoroeth, sodium  chloride, sodium chloride  Assessment/ Plan:   Severe aortic valve stenosis/CAD - aortic valve replacement & CABG Day 5 per Dr. Prescott Gum, stable post-surgery  .  1. ESRD - HD on TTS Hyperkalemia resolved plan HD today 2. HTN/Volume - plan for dialysis in AM 3. Anemia - Hgb 7's transfusion ordered with dialysis 4. Sec HPT - Ca 8.1; Sensipar, no binders. Corrected calcium 9.4 5. DM - insulin per primary.  6. Liver transplant - on Prograf      LOS: 12 Glorya Bartley W @TODAY @10 :06 AM

## 2013-03-21 ENCOUNTER — Inpatient Hospital Stay (HOSPITAL_COMMUNITY): Payer: Medicare Other

## 2013-03-21 ENCOUNTER — Encounter (HOSPITAL_COMMUNITY): Payer: Self-pay | Admitting: Cardiothoracic Surgery

## 2013-03-21 LAB — COMPREHENSIVE METABOLIC PANEL
ALT: 16 U/L (ref 0–35)
AST: 18 U/L (ref 0–37)
Albumin: 2.1 g/dL — ABNORMAL LOW (ref 3.5–5.2)
Alkaline Phosphatase: 74 U/L (ref 39–117)
BILIRUBIN TOTAL: 0.3 mg/dL (ref 0.3–1.2)
BUN: 28 mg/dL — ABNORMAL HIGH (ref 6–23)
CHLORIDE: 98 meq/L (ref 96–112)
CO2: 30 meq/L (ref 19–32)
CREATININE: 4.71 mg/dL — AB (ref 0.50–1.10)
Calcium: 9.1 mg/dL (ref 8.4–10.5)
GFR, EST AFRICAN AMERICAN: 11 mL/min — AB (ref >90)
GFR, EST NON AFRICAN AMERICAN: 9 mL/min — AB (ref >90)
GLUCOSE: 61 mg/dL — AB (ref 70–99)
Potassium: 4 mEq/L (ref 3.7–5.3)
Sodium: 140 mEq/L (ref 137–147)
Total Protein: 5.8 g/dL — ABNORMAL LOW (ref 6.0–8.3)

## 2013-03-21 LAB — CBC
HCT: 28.2 % — ABNORMAL LOW (ref 36.0–46.0)
Hemoglobin: 9.1 g/dL — ABNORMAL LOW (ref 12.0–15.0)
MCH: 30.7 pg (ref 26.0–34.0)
MCHC: 32.3 g/dL (ref 30.0–36.0)
MCV: 95.3 fL (ref 78.0–100.0)
PLATELETS: 134 10*3/uL — AB (ref 150–400)
RBC: 2.96 MIL/uL — AB (ref 3.87–5.11)
RDW: 15.2 % (ref 11.5–15.5)
WBC: 9.8 10*3/uL (ref 4.0–10.5)

## 2013-03-21 LAB — GLUCOSE, CAPILLARY
GLUCOSE-CAPILLARY: 56 mg/dL — AB (ref 70–99)
GLUCOSE-CAPILLARY: 57 mg/dL — AB (ref 70–99)
GLUCOSE-CAPILLARY: 69 mg/dL — AB (ref 70–99)
GLUCOSE-CAPILLARY: 125 mg/dL — AB (ref 70–99)
Glucose-Capillary: 111 mg/dL — ABNORMAL HIGH (ref 70–99)
Glucose-Capillary: 126 mg/dL — ABNORMAL HIGH (ref 70–99)
Glucose-Capillary: 183 mg/dL — ABNORMAL HIGH (ref 70–99)
Glucose-Capillary: 212 mg/dL — ABNORMAL HIGH (ref 70–99)
Glucose-Capillary: 60 mg/dL — ABNORMAL LOW (ref 70–99)
Glucose-Capillary: 167 mg/dL — ABNORMAL HIGH (ref 70–99)

## 2013-03-21 LAB — PROTIME-INR
INR: 1.04 (ref 0.00–1.49)
Prothrombin Time: 13.4 seconds (ref 11.6–15.2)

## 2013-03-21 LAB — PREALBUMIN: Prealbumin: 9.5 mg/dL — ABNORMAL LOW (ref 17.0–34.0)

## 2013-03-21 MED ORDER — SODIUM CHLORIDE 0.9 % IV SOLN: 100.0000 mL | INTRAVENOUS | Status: DC | PRN

## 2013-03-21 MED ORDER — LIDOCAINE-PRILOCAINE 2.5-2.5 % EX CREA: 1.0000 "application " | TOPICAL_CREAM | CUTANEOUS | Status: DC | PRN

## 2013-03-21 MED ORDER — ALTEPLASE 2 MG IJ SOLR: 2.0000 mg | Freq: Once | INTRAMUSCULAR | Status: DC | PRN

## 2013-03-21 MED ORDER — FE FUMARATE-B12-VIT C-FA-IFC PO CAPS
1.0000 | ORAL_CAPSULE | Freq: Three times a day (TID) | ORAL | Status: DC
  Administered 2013-03-21 – 2013-03-29 (×22): 1 via ORAL
  Filled 2013-03-21 (×28): qty 1

## 2013-03-21 MED ORDER — LIDOCAINE HCL (PF) 1 % IJ SOLN: 5.0000 mL | INTRAMUSCULAR | Status: DC | PRN

## 2013-03-21 MED ORDER — DEXTROSE 50 % IV SOLN
INTRAVENOUS | Status: AC
  Administered 2013-03-21: 25 mL
  Filled 2013-03-21: qty 50

## 2013-03-21 MED ORDER — PENTAFLUOROPROP-TETRAFLUOROETH EX AERO: 1.0000 "application " | INHALATION_SPRAY | CUTANEOUS | Status: DC | PRN

## 2013-03-21 MED ORDER — NEPRO/CARBSTEADY PO LIQD: 237.0000 mL | ORAL | Status: DC | PRN

## 2013-03-21 MED ORDER — HEPARIN SODIUM (PORCINE) 1000 UNIT/ML DIALYSIS: 1000.0000 [IU] | INTRAMUSCULAR | Status: DC | PRN

## 2013-03-21 MED ORDER — INSULIN ASPART 100 UNIT/ML ~~LOC~~ SOLN
0.0000 [IU] | Freq: Three times a day (TID) | SUBCUTANEOUS | Status: DC
  Administered 2013-03-22 (×2): 2 [IU] via SUBCUTANEOUS
  Administered 2013-03-23: 1 [IU] via SUBCUTANEOUS
  Administered 2013-03-23: 2 [IU] via SUBCUTANEOUS
  Administered 2013-03-23 – 2013-03-24 (×2): 1 [IU] via SUBCUTANEOUS
  Administered 2013-03-25: 2 [IU] via SUBCUTANEOUS
  Administered 2013-03-25: 1 [IU] via SUBCUTANEOUS
  Administered 2013-03-26 (×2): 2 [IU] via SUBCUTANEOUS
  Administered 2013-03-26 – 2013-03-27 (×2): 3 [IU] via SUBCUTANEOUS
  Administered 2013-03-27 – 2013-03-29 (×6): 2 [IU] via SUBCUTANEOUS

## 2013-03-21 MED ORDER — DARBEPOETIN ALFA-POLYSORBATE 60 MCG/0.3ML IJ SOLN
INTRAMUSCULAR | Status: AC
  Administered 2013-03-21: 60 ug via INTRAVENOUS
  Filled 2013-03-21: qty 0.3

## 2013-03-21 MED ORDER — SODIUM CHLORIDE 0.9 % IV SOLN
100.0000 mL | INTRAVENOUS | Status: DC | PRN
Start: 2013-03-21 — End: 2013-03-21

## 2013-03-21 MED ORDER — DOXERCALCIFEROL 4 MCG/2ML IV SOLN
INTRAVENOUS | Status: AC
  Filled 2013-03-21: qty 2

## 2013-03-21 MED ORDER — DARBEPOETIN ALFA-POLYSORBATE 60 MCG/0.3ML IJ SOLN
60.0000 ug | INTRAMUSCULAR | Status: DC
  Administered 2013-03-21 – 2013-03-28 (×2): 60 ug via INTRAVENOUS
  Filled 2013-03-21 (×2): qty 0.3

## 2013-03-21 NOTE — Progress Notes (Signed)
Utilization review completed.  

## 2013-03-21 NOTE — Progress Notes (Addendum)
[facility] Progress Note   Subjective: Up in chair  Exam  Blood pressure 139/60, pulse 83, temperature 97.5 F (36.4 C), temperature source Oral, resp. rate 20, height 5\' 4"  (1.626 m), weight 84.8 kg (186 lb 15.2 oz), SpO2 100.00%. Alert, up in chair No jvd Bilat basilar egophony and dec'd BS RRR no MRG Abd soft, nt/nd, no ascites Ext no LE edema L thigh AVG +bruit  Dialysis: TTS [place] 4h   74kg   2K/2.25 [place]  L thigh AVG  No heparin  Hect 4 EPO 1000 Venofer 50 mg/wk  Assessment: 1 S/P CABG / AVR 2 ESRD 3 Anemia, Hb down, start esa today w HD 4 2HPTH- sensipar, no binders 5 HTN/volume- vol excess, up 10kg, cxr wet   Plan- HD today, max UF, HD again tomorrow, get vol down; start darbe 60/wk    [name] MD  pager [phone]    cell [phone]  03/21/2013, 10:24 AM     Recent Labs Lab 03/14/13 1411  03/19/13 0436 03/20/13 0355 03/21/13 0329  NA 129*  < > 136* 138 140  K 4.9  < > 3.5* 3.6* 4.0  CL 87*  < > 95* 96 98  CO2 23  < > 27 29 30   GLUCOSE 154*  < > 130* 138* 61*  BUN 44*  < > 25* 18 28*  CREATININE 9.26*  < > 4.61* 3.29* 4.71*  CALCIUM 8.6  < > 8.1* 8.6 9.1  PHOS 6.9*  --   --  2.9  --   < > = values in this interval not displayed.  Recent Labs Lab 03/18/13 0355 03/19/13 0436 03/20/13 0355 03/21/13 0329  AST 35 22  --  18  ALT 28 20  --  16  ALKPHOS 84 78  --  74  BILITOT 0.5 0.3  --  0.3  PROT 5.7* 5.5*  --  5.8*  ALBUMIN 2.2* 2.0* 2.2* 2.1*    Recent Labs Lab 03/19/13 0436 03/20/13 0355 03/21/13 0329  WBC 8.1 8.8 9.8  HGB 7.5* 9.5* 9.1*  HCT 22.5* 28.0* 28.2*  MCV 93.8 93.3 95.3  PLT 96* 99* 134*   . antiseptic oral rinse  15 mL Mouth Rinse BID  . aspirin EC  325 mg Oral Daily  . bisacodyl  10 mg Oral Daily   Or  . bisacodyl  10 mg Rectal Daily  . budesonide-formoterol  2 puff Inhalation BID  . calcium carbonate  1 tablet Oral Daily  . cinacalcet  30 mg Oral Q breakfast  . docusate sodium  200 mg Oral  Daily  . doxercalciferol  4 mcg Intravenous Q T,Th,Sa-HD  . DULoxetine  60 mg Oral QHS  . feeding supplement (NEPRO CARB STEADY)  237 mL Oral BID BM  . ferrous [id number] C-folic acid  1 capsule Oral TID PC  . gabapentin  300 mg Oral QHS  . insulin aspart  0-24 Units Subcutaneous Q4H  . levothyroxine  150 mcg Oral QAC breakfast  . metoprolol tartrate  25 mg Oral BID  . multivitamin with minerals  1 tablet Oral Daily  . nicotine  7 mg Transdermal Daily  . pantoprazole  40 mg Oral Daily  . sodium chloride  10-40 mL Intravenous Q12H  . tacrolimus  5 mg Oral BID  . warfarin  5 mg Oral q1800  . Warfarin - Physician Dosing Inpatient   Does not apply q1800   . sodium chloride Stopped (03/20/13 0700)   sodium chloride, feeding  supplement (NEPRO CARB STEADY), heparin, levalbuterol, lidocaine (PF), lidocaine-prilocaine, metoprolol, ondansetron (ZOFRAN) IV, oxyCODONE, pentafluoroprop-tetrafluoroeth, sodium chloride, sodium chloride

## 2013-03-21 NOTE — Progress Notes (Addendum)
TCTS DAILY ICU PROGRESS NOTE                   [address]            [place] [address]          [phone]   6 Days Post-Op Procedure(s) (LRB): AORTIC VALVE REPLACEMENT (AVR) (N/A) CORONARY ARTERY BYPASS GRAFTING (CABG) times one using left internal mammary artery. (N/A) INTRAOPERATIVE TRANSESOPHAGEAL ECHOCARDIOGRAM (N/A)  Total Length of Stay:  LOS: 13 days   Subjective: Feels better, weak but improving  Objective: Vital signs in last 24 hours: Temp:  [97.1 F (36.2 C)-98.2 F (36.8 C)] 97.1 F (36.2 C) (02/09 0400) Pulse Rate:  [81-132] 132 (02/09 0700) Cardiac Rhythm:  [-] Heart block (02/09 0000) Resp:  [0-21] 10 (02/09 0700) BP: (101-156)/(48-65) 128/48 mmHg (02/09 0700) SpO2:  [91 %-100 %] 97 % (02/09 0700) Weight:  [186 lb 15.2 oz (84.8 kg)] 186 lb 15.2 oz (84.8 kg) (02/09 0500)  Filed Weights   03/19/13 1815 03/20/13 0600 03/21/13 0500  Weight: 186 lb 15.2 oz (84.8 kg) 186 lb 1.1 oz (84.4 kg) 186 lb 15.2 oz (84.8 kg)    Weight change: -3 lb 4.9 oz (-1.5 kg)   Hemodynamic parameters for last 24 hours:    Intake/Output from previous day: 02/08 0701 - 02/09 0700 In: 950 [P.O.:750; I.V.:200] Out: -   Intake/Output this shift:    Current Meds: Scheduled Meds: . antiseptic oral rinse  15 mL Mouth Rinse BID  . aspirin EC  325 mg Oral Daily  . bisacodyl  10 mg Oral Daily   Or  . bisacodyl  10 mg Rectal Daily  . budesonide-formoterol  2 puff Inhalation BID  . calcium carbonate  1 tablet Oral Daily  . cinacalcet  30 mg Oral Q breakfast  . docusate sodium  200 mg Oral Daily  . doxercalciferol  4 mcg Intravenous Q T,Th,Sa-HD  . DULoxetine  60 mg Oral QHS  . feeding supplement (NEPRO CARB STEADY)  237 mL Oral BID BM  . gabapentin  300 mg Oral QHS  . insulin aspart  0-24 Units Subcutaneous Q4H  . levothyroxine  150 mcg Oral QAC breakfast  . metoprolol tartrate  25 mg Oral BID  . multivitamin with minerals  1 tablet Oral Daily  .  nicotine  7 mg Transdermal Daily  . pantoprazole  40 mg Oral Daily  . sodium chloride  10-40 mL Intravenous Q12H  . tacrolimus  5 mg Oral BID  . warfarin  5 mg Oral q1800  . Warfarin - Physician Dosing Inpatient   Does not apply q1800   Continuous Infusions: . sodium chloride Stopped (03/20/13 0700)   PRN Meds:.sodium chloride, feeding supplement (NEPRO CARB STEADY), heparin, levalbuterol, lidocaine (PF), lidocaine-prilocaine, metoprolol, morphine injection, ondansetron (ZOFRAN) IV, oxyCODONE, pentafluoroprop-tetrafluoroeth, sodium chloride, sodium chloride  General appearance: alert, cooperative and no distress Heart: regular rate and rhythm Lungs: fair air exchange  Abdomen: benign Extremities: no edema Wound: incis healing well  Lab Results: CBC: Recent Labs  03/20/13 0355 03/21/13 0329  WBC 8.8 9.8  HGB 9.5* 9.1*  HCT 28.0* 28.2*  PLT 99* 134*   BMET:  Recent Labs  03/20/13 0355 03/21/13 0329  NA 138 140  K 3.6* 4.0  CL 96 98  CO2 29 30  GLUCOSE 138* 61*  BUN 18 28*  CREATININE 3.29* 4.71*  CALCIUM 8.6 9.1    PT/INR:  Recent Labs  03/21/13 0329  LABPROT 13.4  INR 1.04   Radiology: Dg Chest Port 1 View  03/21/2013   CLINICAL DATA:  Follow up effusion.  Post aortic valve placement.  EXAM: PORTABLE CHEST - 1 VIEW  COMPARISON:  03/19/2013.  FINDINGS: Bilateral central line tip proximal superior vena cava level.  No gross pneumothorax.  Diffuse asymmetric airspace disease greater on the left. This may represent pulmonary edema with pleural effusions greater on the left. Left base atelectasis may be present, infiltrate not excluded. Recommend followup until clearance.  Post valve replacement.  Cardiomegaly.  Calcified tortuous aorta.  IMPRESSION: No significant change in appearance of diffuse asymmetric airspace disease greater on the left. This may represent pulmonary edema with pleural effusions greater on the left. Left base atelectasis may be present, infiltrate  not excluded.   Electronically Signed   By: Chauncey Cruel M.D.   On: 03/21/2013 07:10     Assessment/Plan: S/P Procedure(s) (LRB): AORTIC VALVE REPLACEMENT (AVR) (N/A) CORONARY ARTERY BYPASS GRAFTING (CABG) times one using left internal mammary artery. (N/A) INTRAOPERATIVE TRANSESOPHAGEAL ECHOCARDIOGRAM (N/A)  1 conts to progress well 2 nephrology managing dialysis/ renal issues 3 in NSR 4 H/H stable 5 INR remains sub-therapeudic- cont coumadin 7 push rehab/pulm toilet as able 8 push nutrition as able- sugars a little low at times 9 d/c sleeve GOLD,WAYNE E 03/21/2013 7:41 AM   Chart reviewed, patient examined, agree with above. Had HD today without problems.

## 2013-03-21 NOTE — Progress Notes (Signed)
CSW spoke to patient's daughter to see if she wanted to pursue SNF. Daughter stated that she spoke with patient and they would like to pursue SNF either in [place] or [place]. Daughter stated her preference was [facility] in [place]. Patient's daughter stated that she is looking at some in [facility] and will call social worker back later today. CSW called [facility] in [place] and they stated they cannot take a dialysis patient. CSW will inform patient's daughter of this.   Jeanette Caprice, MSW, [place]

## 2013-03-21 NOTE — Progress Notes (Signed)
Inpatient Diabetes Program Recommendations  AACE/ADA: New Consensus Statement on Inpatient Glycemic Control (2013)  Target Ranges:  Prepandial:   less than 140 mg/dL      Peak postprandial:   less than 180 mg/dL (1-2 hours)      Critically ill patients:  140 - 180 mg/dL   Reason for Visit: Results for PAULEEN, GOLEMAN (MRN [id number]) as of 03/21/2013 09:52  Ref. Range 03/20/2013 23:39 03/21/2013 03:24 03/21/2013 03:26 03/21/2013 03:54 03/21/2013 08:08  Glucose-Capillary Latest Range: 70-99 mg/dL 165 (H) 57 (L) 56 (L) 111 (H) 60 (L)   Please decrease Novolog correction to sensitive.    Thanks, Adah Perl, RN, BC-ADM Inpatient Diabetes Coordinator Pager [phone]

## 2013-03-21 NOTE — Progress Notes (Signed)
Patient ID: [name], female   DOB: [birth year], 58 y.o.   MRN: [id number]  SICU Evening Rounds:  Hemodynamically stable after HD today.  Sats 100%.

## 2013-03-21 NOTE — Procedures (Signed)
I was present at this dialysis session, have reviewed the session itself and made  appropriate changes  [name] MD (pgr) [phone]    ([phone] 03/21/2013, 11:31 AM

## 2013-03-22 LAB — PROTIME-INR
INR: 1.38 (ref 0.00–1.49)
Prothrombin Time: 16.6 seconds — ABNORMAL HIGH (ref 11.6–15.2)

## 2013-03-22 LAB — GLUCOSE, CAPILLARY
GLUCOSE-CAPILLARY: 141 mg/dL — AB (ref 70–99)
GLUCOSE-CAPILLARY: 168 mg/dL — AB (ref 70–99)
Glucose-Capillary: 157 mg/dL — ABNORMAL HIGH (ref 70–99)
Glucose-Capillary: 116 mg/dL — ABNORMAL HIGH (ref 70–99)
Glucose-Capillary: 177 mg/dL — ABNORMAL HIGH (ref 70–99)

## 2013-03-22 MED ORDER — PENTAFLUOROPROP-TETRAFLUOROETH EX AERO: 1.0000 "application " | INHALATION_SPRAY | CUTANEOUS | Status: DC | PRN

## 2013-03-22 MED ORDER — LIDOCAINE-PRILOCAINE 2.5-2.5 % EX CREA
1.0000 "application " | TOPICAL_CREAM | CUTANEOUS | Status: DC | PRN
  Filled 2013-03-22: qty 5

## 2013-03-22 MED ORDER — NEPRO/CARBSTEADY PO LIQD
237.0000 mL | ORAL | Status: DC | PRN
  Filled 2013-03-22: qty 237

## 2013-03-22 MED ORDER — ASPIRIN EC 81 MG PO TBEC
81.0000 mg | DELAYED_RELEASE_TABLET | Freq: Every day | ORAL | Status: DC
  Administered 2013-03-23 – 2013-03-29 (×7): 81 mg via ORAL
  Filled 2013-03-22 (×7): qty 1

## 2013-03-22 MED ORDER — TRAMADOL HCL 50 MG PO TABS
50.0000 mg | ORAL_TABLET | Freq: Four times a day (QID) | ORAL | Status: DC | PRN
  Administered 2013-03-22 – 2013-03-24 (×3): 50 mg via ORAL
  Filled 2013-03-22 (×3): qty 1

## 2013-03-22 MED ORDER — HEPARIN SODIUM (PORCINE) 1000 UNIT/ML DIALYSIS: 1000.0000 [IU] | INTRAMUSCULAR | Status: DC | PRN

## 2013-03-22 MED ORDER — SODIUM CHLORIDE 0.9 % IV SOLN: 100.0000 mL | INTRAVENOUS | Status: DC | PRN

## 2013-03-22 MED ORDER — ALTEPLASE 2 MG IJ SOLR
2.0000 mg | Freq: Once | INTRAMUSCULAR | Status: DC | PRN
  Filled 2013-03-22: qty 2

## 2013-03-22 MED ORDER — LIDOCAINE HCL (PF) 1 % IJ SOLN: 5.0000 mL | INTRAMUSCULAR | Status: DC | PRN

## 2013-03-22 MED ORDER — DOXERCALCIFEROL 4 MCG/2ML IV SOLN
INTRAVENOUS | Status: AC
  Filled 2013-03-22: qty 2

## 2013-03-22 MED ORDER — ACETAMINOPHEN 325 MG PO TABS: 325.0000 mg | ORAL_TABLET | ORAL | Status: DC | PRN

## 2013-03-22 NOTE — Progress Notes (Addendum)
TCTS DAILY ICU PROGRESS NOTE                   [address]            [place] [address]          [phone]   7 Days Post-Op Procedure(s) (LRB): AORTIC VALVE REPLACEMENT (AVR) (N/A) CORONARY ARTERY BYPASS GRAFTING (CABG) times one using left internal mammary artery. (N/A) INTRAOPERATIVE TRANSESOPHAGEAL ECHOCARDIOGRAM (N/A)  Total Length of Stay:  LOS: 14 days   Subjective: feela weaker today  Objective: Vital signs in last 24 hours: Temp:  [96.9 F (36.1 C)-98.5 F (36.9 C)] 97.8 F (36.6 C) (02/10 0756) Pulse Rate:  [46-99] 87 (02/10 0802) Cardiac Rhythm:  [-] Normal sinus rhythm;Heart block (02/10 0600) Resp:  [0-29] 22 (02/10 0802) BP: (117-183)/(49-108) 154/62 mmHg (02/10 0700) SpO2:  [94 %-100 %] 100 % (02/10 0802) Weight:  [176 lb 2.4 oz (79.9 kg)-186 lb 8.2 oz (84.6 kg)] 176 lb 2.4 oz (79.9 kg) (02/10 0600)  Filed Weights   03/21/13 1044 03/21/13 1530 03/22/13 0600  Weight: 186 lb 8.2 oz (84.6 kg) 176 lb 2.4 oz (79.9 kg) 176 lb 2.4 oz (79.9 kg)    Weight change: -7.1 oz (-0.2 kg)   Hemodynamic parameters for last 24 hours:    Intake/Output from previous day: 02/09 0701 - 02/10 0700 In: 360 [P.O.:360] Out: 4103   Intake/Output this shift:    Current Meds: Scheduled Meds: . aspirin EC  325 mg Oral Daily  . bisacodyl  10 mg Oral Daily   Or  . bisacodyl  10 mg Rectal Daily  . budesonide-formoterol  2 puff Inhalation BID  . calcium carbonate  1 tablet Oral Daily  . cinacalcet  30 mg Oral Q breakfast  . darbepoetin (ARANESP) injection - DIALYSIS  60 mcg Intravenous Q Mon-HD  . docusate sodium  200 mg Oral Daily  . doxercalciferol  4 mcg Intravenous Q T,Th,Sa-HD  . DULoxetine  60 mg Oral QHS  . feeding supplement (NEPRO CARB STEADY)  237 mL Oral BID BM  . ferrous [id number] C-folic acid  1 capsule Oral TID PC  . gabapentin  300 mg Oral QHS  . insulin aspart  0-9 Units Subcutaneous TID WC  . levothyroxine  150 mcg Oral QAC  breakfast  . metoprolol tartrate  25 mg Oral BID  . multivitamin with minerals  1 tablet Oral Daily  . nicotine  7 mg Transdermal Daily  . pantoprazole  40 mg Oral Daily  . sodium chloride  10-40 mL Intravenous Q12H  . tacrolimus  5 mg Oral BID  . warfarin  5 mg Oral q1800  . Warfarin - Physician Dosing Inpatient   Does not apply q1800   Continuous Infusions: . sodium chloride Stopped (03/20/13 0700)   PRN Meds:.levalbuterol, lidocaine-prilocaine, metoprolol, ondansetron (ZOFRAN) IV, oxyCODONE, pentafluoroprop-tetrafluoroeth, sodium chloride, sodium chloride  General appearance: alert, cooperative and fatigued Heart: regular rate and rhythm Lungs: dimin air exchange throughout Abdomen: soft, non-distended, nontender Extremities: trace edema Wound: incisions healing well  Lab Results: CBC: Recent Labs  03/20/13 0355 03/21/13 0329  WBC 8.8 9.8  HGB 9.5* 9.1*  HCT 28.0* 28.2*  PLT 99* 134*   BMET:  Recent Labs  03/20/13 0355 03/21/13 0329  NA 138 140  K 3.6* 4.0  CL 96 98  CO2 29 30  GLUCOSE 138* 61*  BUN 18 28*  CREATININE 3.29* 4.71*  CALCIUM 8.6 9.1    PT/INR:  Recent Labs  03/22/13 0411  LABPROT 16.6*  INR 1.38   Radiology: Dg Chest Port 1 View  03/21/2013   CLINICAL DATA:  Follow up effusion.  Post aortic valve placement.  EXAM: PORTABLE CHEST - 1 VIEW  COMPARISON:  03/19/2013.  FINDINGS: Bilateral central line tip proximal superior vena cava level.  No gross pneumothorax.  Diffuse asymmetric airspace disease greater on the left. This may represent pulmonary edema with pleural effusions greater on the left. Left base atelectasis may be present, infiltrate not excluded. Recommend followup until clearance.  Post valve replacement.  Cardiomegaly.  Calcified tortuous aorta.  IMPRESSION: No significant change in appearance of diffuse asymmetric airspace disease greater on the left. This may represent pulmonary edema with pleural effusions greater on the left. Left  base atelectasis may be present, infiltrate not excluded.   Electronically Signed   By: Chauncey Cruel M.D.   On: 03/21/2013 07:10     Assessment/Plan: S/P Procedure(s) (LRB): AORTIC VALVE REPLACEMENT (AVR) (N/A) CORONARY ARTERY BYPASS GRAFTING (CABG) times one using left internal mammary artery. (N/A) INTRAOPERATIVE TRANSESOPHAGEAL ECHOCARDIOGRAM (N/A)  1 cont to progress, a bit more fatigued today 2 INR 1.38, cont coumadin 3 no other new labs/xrays 4 sugars well controlled 5 cont to push rehab/pulm toilet   [name],[name] 03/22/2013 8:12 AM   CXR with atelectasis/edema L mid lung zone Needs PT, prob SNF patient examined and medical record reviewed,agree with above note. VAN TRIGT III,PETER 03/22/2013

## 2013-03-22 NOTE — Progress Notes (Signed)
[facility]  Progress Note   Subjective: Up in chair, confused   Exam Blood pressure 139/64, pulse 87, temperature 97.8 F (36.6 C), temperature source Oral, resp. rate 22, height 5\' 4"  (1.626 m), weight 79.9 kg (176 lb 2.4 oz), SpO2 100.00%.  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  5 HTN/volume excess- improving  6 Liver transplant- on prograf   Plan- HD today, max UF as tolerated    [name] MD  pager [phone] cell [phone]  03/22/2013, 10:21 AM   Recent Labs  Lab  03/19/13 0436  03/20/13 0355  03/21/13 0329   NA  136*  138  140   K  3.5*  3.6*  4.0   CL  95*  96  98   CO2  27  29  30    GLUCOSE  130*  138*  61*   BUN  25*  18  28*   CREATININE  4.61*  3.29*  4.71*   CALCIUM  8.1*  8.6  9.1   PHOS  --  2.9  --     Recent Labs  Lab  03/18/13 0355  03/19/13 0436  03/20/13 0355  03/21/13 0329   AST  35  22  --  18   ALT  28  20  --  16   ALKPHOS  84  78  --  74   BILITOT  0.5  0.3  --  0.3   PROT  5.7*  5.5*  --  5.8*   ALBUMIN  2.2*  2.0*  2.2*  2.1*     Recent Labs  Lab  03/19/13 0436  03/20/13 0355  03/21/13 0329   WBC  8.1  8.8  9.8   HGB  7.5*  9.5*  9.1*   HCT  22.5*  28.0*  28.2*   MCV  93.8  93.3  95.3   PLT  96*  99*  134*    .  aspirin EC  325 mg  Oral  Daily   .  bisacodyl  10 mg  Oral  Daily    Or   .  bisacodyl  10 mg  Rectal  Daily   .  budesonide-formoterol  2 puff  Inhalation  BID   .  calcium carbonate  1 tablet  Oral  Daily    .  cinacalcet  30 mg  Oral  Q breakfast   .  darbepoetin (ARANESP) injection - DIALYSIS  60 mcg  Intravenous  Q Mon-HD   .  docusate sodium  200 mg  Oral  Daily   .  doxercalciferol  4 mcg  Intravenous  Q T,Th,Sa-HD   .  DULoxetine  60 mg  Oral  QHS   .  feeding supplement (NEPRO CARB STEADY)  237 mL  Oral  BID BM   .  ferrous [id number] C-folic acid  1 capsule  Oral  TID PC   .  gabapentin  300 mg  Oral  QHS   .  insulin aspart  0-9 Units  Subcutaneous  TID WC   .  levothyroxine  150 mcg  Oral  QAC breakfast   .  metoprolol tartrate  25 mg  Oral  BID   .  multivitamin with minerals  1 tablet  Oral  Daily   .  nicotine  7 mg  Transdermal  Daily   .  pantoprazole  40 mg  Oral  Daily   .  sodium chloride  10-40 mL  Intravenous  Q12H   .  tacrolimus  5 mg  Oral  BID   .  warfarin  5 mg  Oral  q1800   .  Warfarin - Physician Dosing Inpatient   Does not apply  q1800    .  sodium chloride  Stopped (03/20/13 0700)    levalbuterol, lidocaine-prilocaine, metoprolol, ondansetron (ZOFRAN) IV, pentafluoroprop-tetrafluoroeth, sodium chloride, sodium chloride

## 2013-03-22 NOTE — Progress Notes (Signed)
POD # 7 AVR, CABG  Still confused  BP 141/67  Pulse 56  Temp(Src) 98.6 F (37 C) (Oral)  Resp 17  Ht 5\' 4"  (1.626 m)  Wt 169 lb 1.5 oz (76.7 kg)  BMI 29.01 kg/m2  SpO2 100%   Intake/Output Summary (Last 24 hours) at 03/22/13 1824 Last data filed at 03/22/13 1614  Gross per 24 hour  Intake    100 ml  Output   3000 ml  Net  -2900 ml    Continue current care

## 2013-03-22 NOTE — Procedures (Signed)
I was present at this dialysis session, have reviewed the session itself and made  appropriate changes  [name] MD (pgr) [phone]    ([phone] 03/22/2013, 2:39 PM

## 2013-03-22 NOTE — Progress Notes (Deleted)
[facility] Progress Note   Subjective: Up in chair, confused  Exam  Blood pressure 139/64, pulse 87, temperature 97.8 F (36.6 C), temperature source Oral, resp. rate 22, height 5\' 4"  (1.626 m), weight 79.9 kg (176 lb 2.4 oz), SpO2 100.00%. 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 5 HTN/volume excess- improving 6 Liver transplant- on prograf   Plan- HD today, max UF as tolerated    [name] MD  pager [phone]    cell [phone]  03/22/2013, 10:21 AM     Recent Labs Lab 03/19/13 0436 03/20/13 0355 03/21/13 0329  NA 136* 138 140  K 3.5* 3.6* 4.0  CL 95* 96 98  CO2 27 29 30   GLUCOSE 130* 138* 61*  BUN 25* 18 28*  CREATININE 4.61* 3.29* 4.71*  CALCIUM 8.1* 8.6 9.1  PHOS  --  2.9  --     Recent Labs Lab 03/18/13 0355 03/19/13 0436 03/20/13 0355 03/21/13 0329  AST 35 22  --  18  ALT 28 20  --  16  ALKPHOS 84 78  --  74  BILITOT 0.5 0.3  --  0.3  PROT 5.7* 5.5*  --  5.8*  ALBUMIN 2.2* 2.0* 2.2* 2.1*    Recent Labs Lab 03/19/13 0436 03/20/13 0355 03/21/13 0329  WBC 8.1 8.8 9.8  HGB 7.5* 9.5* 9.1*  HCT 22.5* 28.0* 28.2*  MCV 93.8 93.3 95.3  PLT 96* 99* 134*   . aspirin EC  325 mg Oral Daily  . bisacodyl  10 mg Oral Daily   Or  . bisacodyl  10 mg Rectal Daily  . budesonide-formoterol  2 puff Inhalation BID  . calcium carbonate  1 tablet Oral Daily  . cinacalcet  30 mg Oral Q breakfast  . darbepoetin (ARANESP) injection - DIALYSIS  60 mcg Intravenous Q  Mon-HD  . docusate sodium  200 mg Oral Daily  . doxercalciferol  4 mcg Intravenous Q T,Th,Sa-HD  . DULoxetine  60 mg Oral QHS  . feeding supplement (NEPRO CARB STEADY)  237 mL Oral BID BM  . ferrous [id number] C-folic acid  1 capsule Oral TID PC  . gabapentin  300 mg Oral QHS  . insulin aspart  0-9 Units Subcutaneous TID WC  . levothyroxine  150 mcg Oral QAC breakfast  . metoprolol tartrate  25 mg Oral BID  . multivitamin with minerals  1 tablet Oral Daily  . nicotine  7 mg Transdermal Daily  . pantoprazole  40 mg Oral Daily  . sodium chloride  10-40 mL Intravenous Q12H  . tacrolimus  5 mg Oral BID  . warfarin  5 mg Oral q1800  . Warfarin - Physician Dosing Inpatient   Does not apply q1800   . sodium chloride Stopped (03/20/13 0700)   levalbuterol, lidocaine-prilocaine, metoprolol, ondansetron (ZOFRAN) IV, pentafluoroprop-tetrafluoroeth, sodium chloride, sodium chloride  Pt needs to start PT Stop narcotics due to confusion Check liver panel  in am  patient examined and medical record reviewed,agree with above note. VAN TRIGT III,[name] 03/22/2013

## 2013-03-22 NOTE — Clinical Social Work Note (Signed)
CSW intern [place] provided patient/family with bed offers. Facility preference is [facility] in [place].  Kayleen Alig Givens, MSW, LCSW [phone]

## 2013-03-22 NOTE — Progress Notes (Signed)
Physical Therapy Treatment Patient Details Name: [name] MRN: [id number] DOB: 1955-06-10 Today's Date: 03/22/2013 Time: 1142-1206 PT Time Calculation (min): 24 min  PT Assessment / Plan / Recommendation  History of Present Illness pt admitted with CAD and SOB, s/p AVR and CABG x1   PT Comments   Pt slowly progressing, but mildly self-limiting.  Still doesn't follow sternal precautions without cues.  Sats on 2L [place] remain solidly in the 90's, EHR fluctuating into the 110's, but the waveform was not good.   Follow Up Recommendations  SNF (vs HHPT if pt independent enough for PRN assist)     Does the patient have the potential to tolerate intense rehabilitation     Barriers to Discharge        Equipment Recommendations  None recommended by PT    Recommendations for Other Services    Frequency Min 3X/week   Progress towards PT Goals Progress towards PT goals: Progressing toward goals  Plan Current plan remains appropriate    Precautions / Restrictions Precautions Precautions: Fall   Pertinent Vitals/Pain See above.     Mobility  Bed Mobility Overal bed mobility: Needs Assistance Bed Mobility: Supine to Sit Supine to sit: Mod assist General bed mobility comments: pt bridged to EOB with min assist;  cues to help pt try to  build momentum, but she was unable and needed significant assist to initiate the sit up. Transfers Overall transfer level: Needs assistance Equipment used:  (pushed w/c) Transfers: Sit to/from Stand Sit to Stand: Mod assist General transfer comment: sit to stand times 3 to practice coming forward without use of UE, but pt resistant to coming sufficiently forward today needing extra assist. Ambulation/Gait Ambulation/Gait assistance: Min assist Ambulation Distance (Feet): 180 Feet Assistive device:  (pushing w/c) Gait Pattern/deviations: Step-through pattern;Decreased stride length;Wide base of support Gait velocity: very slow Gait velocity  interpretation: Below normal speed for age/gender General Gait Details: Initially gait could be characterized by retropulsion, but as she progress and was given input, she started shifting her weight forward.  Still more foot flat contact than heel/toe gait pattern    Exercises     PT Diagnosis:    PT Problem List:   PT Treatment Interventions:     PT Goals (current goals can now be found in the care plan section) Acute Rehab PT Goals Patient Stated Goal: Ultimately independent PT Goal Formulation: With patient Time For Goal Achievement: 04/01/13 Potential to Achieve Goals: Good  Visit Information  Last PT Received On: 03/22/13 Assistance Needed: +1 (for lines only) History of Present Illness: pt admitted with CAD and SOB, s/p AVR and CABG x1    Subjective Data  Subjective: What's going on with these shunts.Marland KitchenMarland KitchenMarland KitchenOh you're exclusively with therapy. Patient Stated Goal: Ultimately independent   Cognition  Cognition Arousal/Alertness: Awake/alert Behavior During Therapy: WFL for tasks assessed/performed Overall Cognitive Status: Impaired/Different from baseline (pt moderately confused today)    Balance  Balance Overall balance assessment: Needs assistance Sitting-balance support: Feet supported;No upper extremity supported Sitting balance-Leahy Scale: Fair Postural control: Posterior lean Standing balance support: Bilateral upper extremity supported Standing balance-Leahy Scale: Fair  End of Session PT - End of Session Equipment Utilized During Treatment: Oxygen Activity Tolerance: Patient tolerated treatment well;Patient limited by pain;Other (comment) (pt reported epigastric area pain on the L) Patient left: in bed;with call bell/phone within reach Nurse Communication: Mobility status   GP     [name], [name] 03/22/2013, 12:31 PM 03/22/2013  [name], [name] [phone]  (  pager)

## 2013-03-23 ENCOUNTER — Inpatient Hospital Stay (HOSPITAL_COMMUNITY): Payer: Medicare Other

## 2013-03-23 LAB — COMPREHENSIVE METABOLIC PANEL
ALT: 14 U/L (ref 0–35)
AST: 18 U/L (ref 0–37)
Albumin: 2.2 g/dL — ABNORMAL LOW (ref 3.5–5.2)
Alkaline Phosphatase: 73 U/L (ref 39–117)
BUN: 17 mg/dL (ref 6–23)
CO2: 30 mEq/L (ref 19–32)
Calcium: 8.9 mg/dL (ref 8.4–10.5)
Chloride: 96 mEq/L (ref 96–112)
Creatinine, Ser: 3.62 mg/dL — ABNORMAL HIGH (ref 0.50–1.10)
GFR calc Af Amer: 15 mL/min — ABNORMAL LOW (ref >90)
GFR calc non Af Amer: 13 mL/min — ABNORMAL LOW (ref >90)
Glucose, Bld: 132 mg/dL — ABNORMAL HIGH (ref 70–99)
Potassium: 3.8 mEq/L (ref 3.7–5.3)
Sodium: 139 mEq/L (ref 137–147)
Total Bilirubin: 0.4 mg/dL (ref 0.3–1.2)
Total Protein: 6.1 g/dL (ref 6.0–8.3)

## 2013-03-23 LAB — CBC
HCT: 28.5 % — ABNORMAL LOW (ref 36.0–46.0)
Hemoglobin: 9.2 g/dL — ABNORMAL LOW (ref 12.0–15.0)
MCH: 31.2 pg (ref 26.0–34.0)
MCHC: 32.3 g/dL (ref 30.0–36.0)
MCV: 96.6 fL (ref 78.0–100.0)
Platelets: 113 10*3/uL — ABNORMAL LOW (ref 150–400)
RBC: 2.95 MIL/uL — ABNORMAL LOW (ref 3.87–5.11)
RDW: 14.9 % (ref 11.5–15.5)
WBC: 7.1 10*3/uL (ref 4.0–10.5)

## 2013-03-23 LAB — PROTIME-INR
INR: 1.7 — ABNORMAL HIGH (ref 0.00–1.49)
Prothrombin Time: 19.5 s — ABNORMAL HIGH (ref 11.6–15.2)

## 2013-03-23 LAB — GLUCOSE, CAPILLARY
Glucose-Capillary: 122 mg/dL — ABNORMAL HIGH (ref 70–99)
Glucose-Capillary: 157 mg/dL — ABNORMAL HIGH (ref 70–99)
Glucose-Capillary: 135 mg/dL — ABNORMAL HIGH (ref 70–99)
Glucose-Capillary: 115 mg/dL — ABNORMAL HIGH (ref 70–99)

## 2013-03-23 MED ORDER — MOVING RIGHT ALONG BOOK
Freq: Once | Status: AC
  Administered 2013-03-23: 13:00:00
  Filled 2013-03-23: qty 1

## 2013-03-23 MED ORDER — WARFARIN SODIUM 4 MG PO TABS
4.0000 mg | ORAL_TABLET | Freq: Every day | ORAL | Status: DC
  Administered 2013-03-23: 4 mg via ORAL
  Filled 2013-03-23 (×2): qty 1

## 2013-03-23 MED ORDER — SENNA 8.6 MG PO TABS
1.0000 | ORAL_TABLET | Freq: Two times a day (BID) | ORAL | Status: DC | PRN
  Administered 2013-03-23 – 2013-03-24 (×2): 8.6 mg via ORAL
  Filled 2013-03-23 (×2): qty 1

## 2013-03-23 MED ORDER — MAGNESIUM HYDROXIDE 400 MG/5ML PO SUSP
30.0000 mL | Freq: Every day | ORAL | Status: DC | PRN
  Filled 2013-03-23: qty 30

## 2013-03-23 MED ORDER — SODIUM CHLORIDE 0.9 % IJ SOLN
3.0000 mL | Freq: Two times a day (BID) | INTRAMUSCULAR | Status: DC
  Administered 2013-03-23 – 2013-03-27 (×8): 3 mL via INTRAVENOUS

## 2013-03-23 MED ORDER — SODIUM CHLORIDE 0.9 % IV SOLN
250.0000 mL | INTRAVENOUS | Status: DC | PRN
Start: 2013-03-23 — End: 2013-03-29

## 2013-03-23 MED ORDER — DIPHENHYDRAMINE HCL 25 MG PO CAPS: 25.0000 mg | ORAL_CAPSULE | Freq: Every evening | ORAL | Status: DC | PRN

## 2013-03-23 MED ORDER — SODIUM CHLORIDE 0.9 % IJ SOLN: 3.0000 mL | INTRAMUSCULAR | Status: DC | PRN

## 2013-03-23 NOTE — Progress Notes (Signed)
The Chaplain offered emotional and spiritual support to the patient today. The patient informed the Chaplain in the beginning moments of their visit that she would not be able to talk too much because her throat was bothering her immensely. The Chaplain did not stay long because of the patient's inability to talk and so a follow up visit will be re-scheduled with the patient.  Chaplain Clista Bernhardt [place]

## 2013-03-23 NOTE — Progress Notes (Signed)
[name]  Progress Note   Subjective: Up in chair, thinking improved  Exam Blood pressure 139/64, pulse 87, temperature 97.8 F (36.6 C), temperature source Oral, resp. rate 22, height 5\' 4"  (1.626 m), weight 79.9 kg (176 lb 2.4 oz), SpO2 100.00%.  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  5 HTN/volume excess- resolving, up 3-4 kg still, on MTP only 6 Liver transplant- on prograf   Plan- HD tomorrow, UF to dry wt 74 kg as tolerated    [name] MD  pager [phone] cell [phone]  03/22/2013, 10:21 AM   Recent Labs  Lab  03/19/13 0436  03/20/13 0355  03/21/13 0329   NA  136*  138  140   K  3.5*  3.6*  4.0   CL  95*  96  98   CO2  27  29  30    GLUCOSE  130*  138*  61*   BUN  25*  18  28*   CREATININE  4.61*  3.29*  4.71*   CALCIUM  8.1*  8.6  9.1   PHOS  --  2.9  --     Recent Labs  Lab  03/18/13 0355  03/19/13 0436  03/20/13 0355  03/21/13 0329   AST  35  22  --  18   ALT  28  20  --  16   ALKPHOS  84  78  --  74   BILITOT  0.5  0.3  --  0.3   PROT  5.7*  5.5*  --  5.8*   ALBUMIN  2.2*  2.0*  2.2*  2.1*     Recent Labs  Lab  03/19/13 0436  03/20/13 0355  03/21/13 0329   WBC  8.1  8.8  9.8   HGB  7.5*  9.5*  9.1*   HCT  22.5*  28.0*  28.2*   MCV  93.8  93.3  95.3   PLT  96*  99*  134*    .  aspirin EC  325 mg  Oral  Daily   .  bisacodyl  10 mg  Oral  Daily    Or   .  bisacodyl  10 mg  Rectal  Daily   .  budesonide-formoterol  2 puff  Inhalation  BID   .  calcium carbonate  1 tablet  Oral  Daily   .  cinacalcet  30 mg  Oral  Q breakfast    .  darbepoetin (ARANESP) injection - DIALYSIS  60 mcg  Intravenous  Q Mon-HD   .  docusate sodium  200 mg  Oral  Daily   .  doxercalciferol  4 mcg  Intravenous  Q T,Th,Sa-HD   .  DULoxetine  60 mg  Oral  QHS   .  feeding supplement (NEPRO CARB STEADY)  237 mL  Oral  BID BM   .  ferrous fumarate-b12-vitamic  C-folic acid  1 capsule  Oral  TID PC   .  gabapentin  300 mg  Oral  QHS   .  insulin aspart  0-9 Units  Subcutaneous  TID WC   .  levothyroxine  150 mcg  Oral  QAC breakfast   .  metoprolol tartrate  25 mg  Oral  BID   .  multivitamin with minerals  1 tablet  Oral  Daily   .  nicotine  7 mg  Transdermal  Daily   .  pantoprazole  40 mg  Oral  Daily   .  sodium chloride  10-40 mL  Intravenous  Q12H   .  tacrolimus  5 mg  Oral  BID   .  warfarin  5 mg  Oral  q1800   .  Warfarin - Physician Dosing Inpatient   Does not apply  q1800    .  sodium chloride  Stopped (03/20/13 0700)    levalbuterol, lidocaine-prilocaine, metoprolol, ondansetron (ZOFRAN) IV, pentafluoroprop-tetrafluoroeth, sodium chloride, sodium chloride

## 2013-03-23 NOTE — Progress Notes (Signed)
Pacing wires removed per hospital protocol.  Tolerated procedure well.  First BP 136/63 Patient SR rate of 84.  No problems noted from wires being removed.

## 2013-03-23 NOTE — Progress Notes (Signed)
8 Days Post-Op Procedure(s) (LRB): AORTIC VALVE REPLACEMENT (AVR) (N/A) CORONARY ARTERY BYPASS GRAFTING (CABG) times one using left internal mammary artery. (N/A) INTRAOPERATIVE TRANSESOPHAGEAL ECHOCARDIOGRAM (N/A) Subjective: Feels stronger Walked in ICU NSR, afebrile Had HD yesterday- CXR clearing   Objective: Vital signs in last 24 hours: Temp:  [96.9 F (36.1 C)-98.7 F (37.1 C)] 97.9 F (36.6 C) (02/11 0729) Pulse Rate:  [56-90] 85 (02/11 0600) Cardiac Rhythm:  [-] Normal sinus rhythm (02/11 0600) Resp:  [6-22] 17 (02/11 0600) BP: (97-141)/(43-84) 124/55 mmHg (02/11 0600) SpO2:  [97 %-100 %] 100 % (02/11 0814) Weight:  [169 lb 1.5 oz (76.7 kg)-176 lb 2.4 oz (79.9 kg)] 173 lb (78.472 kg) (02/11 0500)  Hemodynamic parameters for last 24 hours:   stable  Intake/Output from previous day: 02/10 0701 - 02/11 0700 In: -  Out: 3000  Intake/Output this shift:   EXAM Sharp valve click Incision clean  Lab Results:  Recent Labs  03/21/13 0329 03/23/13 0413  WBC 9.8 7.1  HGB 9.1* 9.2*  HCT 28.2* 28.5*  PLT 134* 113*   BMET:  Recent Labs  03/21/13 0329 03/23/13 0413  NA 140 139  K 4.0 3.8  CL 98 96  CO2 30 30  GLUCOSE 61* 132*  BUN 28* 17  CREATININE 4.71* 3.62*  CALCIUM 9.1 8.9    PT/INR:  Recent Labs  03/23/13 0413  LABPROT 19.5*  INR 1.70*   ABG    Component Value Date/Time   PHART 7.470* 03/19/2013 1455   HCO3 30.5* 03/19/2013 1455   TCO2 32 03/19/2013 1455   ACIDBASEDEF 6.0* 03/16/2013 0912   O2SAT 97.0 03/19/2013 1455   CBG (last 3)   Recent Labs  03/22/13 1814 03/22/13 2132 03/23/13 0731  GLUCAP 116* 177* 122*    Assessment/Plan: S/P Procedure(s) (LRB): AORTIC VALVE REPLACEMENT (AVR) (N/A) CORONARY ARTERY BYPASS GRAFTING (CABG) times one using left internal mammary artery. (N/A) INTRAOPERATIVE TRANSESOPHAGEAL ECHOCARDIOGRAM (N/A)  Plan tx to stepdown Cont coumadin load for mechanical AVR SNF is the plan @ DC DC EPW's  LOS: 15 days     [name],[name] 03/23/2013

## 2013-03-23 NOTE — Progress Notes (Signed)
Physical Therapy Treatment Patient Details Name: [name] MRN: [id number] DOB: 11-13-1955 Today's Date: 03/23/2013 Time: 1010-1030 PT Time Calculation (min): 20 min  PT Assessment / Plan / Recommendation  History of Present Illness pt admitted with CAD and SOB, s/p AVR and CABG x1   PT Comments   Pt much improved today.  Pt more focused, following cues.  Still needing some assist to stand.  Minimal listing/drifting left during ambulation.  Vitals were stable with sats in low to mid 90's on RA  Follow Up Recommendations  SNF (vs HHPT if pt independent enough for PRN assist)     Does the patient have the potential to tolerate intense rehabilitation     Barriers to Discharge        Equipment Recommendations  None recommended by PT    Recommendations for Other Services    Frequency Min 3X/week   Progress towards PT Goals Progress towards PT goals: Progressing toward goals  Plan Current plan remains appropriate    Precautions / Restrictions Precautions Precautions: Fall   Pertinent Vitals/Pain See above    Mobility  Transfers Overall transfer level: Needs assistance Equipment used:  (pushed w/c) Transfers: Sit to/from Stand Sit to Stand: Mod assist General transfer comment: still needin assist coming forward; cues to not use UE's Ambulation/Gait Ambulation/Gait assistance: Min assist Ambulation Distance (Feet): 200 Feet Assistive device:  (pushing w/c) Gait Pattern/deviations: Step-through pattern;Drifts right/left;Decreased step length - right;Decreased step length - left;Decreased stride length (mild left lateral lean) Gait velocity: pt showed ability today to incr. cadence Gait velocity interpretation: Below normal speed for age/gender    Exercises     PT Diagnosis:    PT Problem List:   PT Treatment Interventions:     PT Goals (current goals can now be found in the care plan section) Acute Rehab PT Goals Patient Stated Goal: Ultimately independent PT Goal  Formulation: With patient Time For Goal Achievement: 04/01/13 Potential to Achieve Goals: Good  Visit Information  Last PT Received On: 03/23/13 Assistance Needed: +1 (for lines only) History of Present Illness: pt admitted with CAD and SOB, s/p AVR and CABG x1    Subjective Data  Subjective: I'm feeling much better...you said I'd be leaving the unit today...bye Patient Stated Goal: Ultimately independent   Cognition  Cognition Arousal/Alertness: Awake/alert Behavior During Therapy: WFL for tasks assessed/performed Overall Cognitive Status: Within Functional Limits for tasks assessed (pt moderately confused today)    Balance  Balance Overall balance assessment: Needs assistance Sitting balance-Leahy Scale: Good Standing balance support: Bilateral upper extremity supported;During functional activity Standing balance-Leahy Scale: Fair Standing balance comment: mild list to left, more unsteady dynamically than statically  End of Session PT - End of Session Activity Tolerance: Patient tolerated treatment well ( ) Patient left: with call bell/phone within reach;in chair Nurse Communication: Mobility status   GP     Lashawnta Burgert, Tessie Fass 03/23/2013, 12:03 PM 03/23/2013  [name], [place] [phone]  (pager)

## 2013-03-23 NOTE — Progress Notes (Signed)
CARDIAC REHAB PHASE I   PRE:  Rate/Rhythm: 82SR  BP:  Supine: 106/55  Sitting:   Standing:    SaO2: 86-90%RA  MODE:  Ambulation: 150 ft   POST:  Rate/Rhythm: 97 SR  BP:  Supine:   Sitting: 112/49  Standing:    SaO2: 97%2L 1414-1500 Assisted pt to bathroom to try to have BM. Pt c/o constipation. Notified pt's RN. Pt walked 150 ft on 2L with gait belt use and rolling walker. Stopped once to rest. To recliner after walk. Did not want to try to go farther this walk.   Graylon Good, RN BSN  03/23/2013 2:53 PM

## 2013-03-24 ENCOUNTER — Inpatient Hospital Stay (HOSPITAL_COMMUNITY): Payer: Medicare Other

## 2013-03-24 LAB — BASIC METABOLIC PANEL
BUN: 29 mg/dL — ABNORMAL HIGH (ref 6–23)
CO2: 24 mEq/L (ref 19–32)
Calcium: 10.1 mg/dL (ref 8.4–10.5)
Chloride: 92 mEq/L — ABNORMAL LOW (ref 96–112)
Creatinine, Ser: 5.45 mg/dL — ABNORMAL HIGH (ref 0.50–1.10)
GFR calc Af Amer: 9 mL/min — ABNORMAL LOW (ref >90)
GFR calc non Af Amer: 8 mL/min — ABNORMAL LOW (ref >90)
Glucose, Bld: 153 mg/dL — ABNORMAL HIGH (ref 70–99)
Potassium: 4.2 mEq/L (ref 3.7–5.3)
Sodium: 135 mEq/L — ABNORMAL LOW (ref 137–147)

## 2013-03-24 LAB — CBC
HCT: 33.2 % — ABNORMAL LOW (ref 36.0–46.0)
Hemoglobin: 10.7 g/dL — ABNORMAL LOW (ref 12.0–15.0)
MCH: 30.8 pg (ref 26.0–34.0)
MCHC: 32.2 g/dL (ref 30.0–36.0)
MCV: 95.7 fL (ref 78.0–100.0)
Platelets: 214 10*3/uL (ref 150–400)
RBC: 3.47 MIL/uL — ABNORMAL LOW (ref 3.87–5.11)
RDW: 14.6 % (ref 11.5–15.5)
WBC: 12.8 10*3/uL — ABNORMAL HIGH (ref 4.0–10.5)

## 2013-03-24 LAB — GLUCOSE, CAPILLARY
GLUCOSE-CAPILLARY: 99 mg/dL (ref 70–99)
Glucose-Capillary: 138 mg/dL — ABNORMAL HIGH (ref 70–99)
Glucose-Capillary: 103 mg/dL — ABNORMAL HIGH (ref 70–99)
Glucose-Capillary: 147 mg/dL — ABNORMAL HIGH (ref 70–99)

## 2013-03-24 LAB — PROTIME-INR
INR: 2.15 — ABNORMAL HIGH (ref 0.00–1.49)
Prothrombin Time: 23.3 seconds — ABNORMAL HIGH (ref 11.6–15.2)

## 2013-03-24 MED ORDER — DOXERCALCIFEROL 4 MCG/2ML IV SOLN
INTRAVENOUS | Status: AC
  Filled 2013-03-24: qty 2

## 2013-03-24 MED ORDER — LACTULOSE 10 GM/15ML PO SOLN
10.0000 g | Freq: Every day | ORAL | Status: DC | PRN
  Administered 2013-03-24: 10 g via ORAL
  Filled 2013-03-24 (×2): qty 15

## 2013-03-24 MED ORDER — WARFARIN SODIUM 5 MG PO TABS
5.0000 mg | ORAL_TABLET | Freq: Every day | ORAL | Status: DC
  Filled 2013-03-24: qty 1

## 2013-03-24 MED ORDER — WARFARIN SODIUM 4 MG PO TABS
4.0000 mg | ORAL_TABLET | Freq: Every day | ORAL | Status: DC
  Administered 2013-03-24: 4 mg via ORAL
  Filled 2013-03-24: qty 1

## 2013-03-24 MED ORDER — IBUPROFEN 200 MG PO TABS
200.0000 mg | ORAL_TABLET | Freq: Four times a day (QID) | ORAL | Status: DC | PRN
  Administered 2013-03-24 – 2013-03-26 (×2): 200 mg via ORAL
  Filled 2013-03-24 (×2): qty 1

## 2013-03-24 NOTE — Progress Notes (Addendum)
CSW provided bed offers to patient's daughter. Patient's daughter did not like any of the options in [facility] and wanted [facility] in [place]. Patient's daughter stated her dialysis center in [place] would work with [facility] to transfer patient to a dialysis center in [place] so patient can go to [facility] [facility]. CSW is working with Freda Munro in dialysis.   Jeanette Caprice, MSW, [place]

## 2013-03-24 NOTE — Progress Notes (Signed)
[facility] Progress Note   Subjective: Confused again today, on tramadol prn  Filed Vitals:   03/24/13 1130 03/24/13 1200 03/24/13 1230 03/24/13 1300  BP: 101/54 109/56 129/61 139/72  Pulse: 80 81 81 83  Temp:      TempSrc:      Resp:      Height:      Weight:      SpO2:      Exam Awake, alert, no distress  No jvd  Clear lungs bilat RRR no MRG Abd soft, nt/nd, no ascites  Ext no LE edema  L thigh AVG +bruit  Neuro confused, "I lost my baby", crying out quietly, nonfocal  Dialysis: TTS [place]  4h    74kg   2K/2.25 Bath   L thigh AVG    No heparin  Hect 4 EPO 1000 Venofer 50 mg/wk   Assessment:  1 S/P CABG / AVR  2 Altered mental status- confused again today, on tramadol now, not tolerating strong pain meds  2 ESRD 3 Anemia- Hb 9's, darbe 60/wk  4 2HPTH- sensipar, 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  5 HTN/volume excess- persists, up 6kg today, on MTP only 6 Liver transplant- on prograf   Plan- extra HD tomorrow due to excessive wt gains in hospital, d/w primary re: stopping tramadol   [name] MD pager [phone]    cell [phone] 03/24/2013, 1:40 PM   Recent Labs Lab 03/20/13 0355 03/21/13 0329 03/23/13 0413 03/24/13 0550  NA 138 140 139 135*  K 3.6* 4.0 3.8 4.2  CL 96 98 96 92*  CO2 29 30 30 24   GLUCOSE 138* 61* 132* 153*  BUN 18 28* 17 29*  CREATININE 3.29* 4.71* 3.62* 5.45*  CALCIUM 8.6 9.1 8.9 10.1  PHOS 2.9  --   --   --     Recent Labs Lab 03/19/13 0436 03/20/13 0355 03/21/13 0329 03/23/13 0413  AST 22  --  18 18  ALT 20  --  16 14  ALKPHOS 78  --  74 73  BILITOT 0.3  --  0.3 0.4  PROT 5.5*  --  5.8* 6.1  ALBUMIN 2.0* 2.2* 2.1* 2.2*    Recent Labs Lab 03/21/13 0329 03/23/13 0413 03/24/13 0550  WBC 9.8 7.1 12.8*  HGB 9.1* 9.2* 10.7*  HCT 28.2* 28.5* 33.2*  MCV 95.3 96.6 95.7  PLT 134* 113* 214   . aspirin EC  81 mg Oral Daily  . bisacodyl  10 mg Oral Daily   Or  . bisacodyl  10 mg Rectal Daily  . budesonide-formoterol  2  puff Inhalation BID  . calcium carbonate  1 tablet Oral Daily  . cinacalcet  30 mg Oral Q breakfast  . darbepoetin (ARANESP) injection - DIALYSIS  60 mcg Intravenous Q Mon-HD  . docusate sodium  200 mg Oral Daily  . doxercalciferol      . doxercalciferol  4 mcg Intravenous Q T,Th,Sa-HD  . DULoxetine  60 mg Oral QHS  . feeding supplement (NEPRO CARB STEADY)  237 mL Oral BID BM  . ferrous [id number] C-folic acid  1 capsule Oral TID PC  . gabapentin  300 mg Oral QHS  . insulin aspart  0-9 Units Subcutaneous TID WC  . levothyroxine  150 mcg Oral QAC breakfast  . metoprolol tartrate  25 mg Oral BID  . multivitamin with minerals  1 tablet Oral Daily  . nicotine  7 mg Transdermal Daily  . pantoprazole  40 mg Oral Daily  . sodium  chloride  10-40 mL Intravenous Q12H  . sodium chloride  3 mL Intravenous Q12H  . tacrolimus  5 mg Oral BID  . warfarin  5 mg Oral q1800  . Warfarin - Physician Dosing Inpatient   Does not apply q1800   . sodium chloride Stopped (03/20/13 0700)   sodium chloride, diphenhydrAMINE, lactulose, magnesium hydroxide, ondansetron (ZOFRAN) IV, senna, sodium chloride, sodium chloride, sodium chloride, traMADol

## 2013-03-24 NOTE — Procedures (Signed)
I was present at this dialysis session, have reviewed the session itself and made  appropriate changes  [name] MD (pgr) [phone]    ([phone] 03/24/2013, 1:39 PM

## 2013-03-24 NOTE — Progress Notes (Addendum)
       [address]       [place] [address]             [phone]          9 Days Post-Op Procedure(s) (LRB): AORTIC VALVE REPLACEMENT (AVR) (N/A) CORONARY ARTERY BYPASS GRAFTING (CABG) times one using left internal mammary artery. (N/A) INTRAOPERATIVE TRANSESOPHAGEAL ECHOCARDIOGRAM (N/A)  Subjective: C/o constipation.  Eating ok.  Still gets SOB easily when walking.   Objective: Vital signs in last 24 hours: Patient Vitals for the past 24 hrs:  BP Temp Temp src Pulse Resp SpO2 Weight  03/24/13 0427 144/52 mmHg 97.9 F (36.6 C) Oral 80 - 95 % -  03/24/13 0420 - - - - - - 173 lb 4.5 oz (78.6 kg)  03/23/13 2153 145/64 mmHg 98.1 F (36.7 C) Oral 84 - 95 % -  03/23/13 1215 136/63 mmHg 97.7 F (36.5 C) Oral - - 96 % -  03/23/13 0814 - - - - - 100 % -  03/23/13 0800 89/46 mmHg - - 81 13 100 % -   Current Weight  03/24/13 173 lb 4.5 oz (78.6 kg)  PRE-OPERATIVE WEIGHT: 74 kg    Intake/Output from previous day: 02/11 0701 - 02/12 0700 In: 240 [P.O.:240] Out: -   CBGs 135-115-153-138    PHYSICAL EXAM:  Heart: RRR, good valve click Lungs: Few crackles in bases, L>R Wound: Clean and dry Extremities: Trace LE edema    Lab Results: CBC: Recent Labs  03/23/13 0413 03/24/13 0550  WBC 7.1 12.8*  HGB 9.2* 10.7*  HCT 28.5* 33.2*  PLT 113* 214   BMET:  Recent Labs  03/23/13 0413 03/24/13 0550  NA 139 135*  K 3.8 4.2  CL 96 92*  CO2 30 24  GLUCOSE 132* 153*  BUN 17 29*  CREATININE 3.62* 5.45*  CALCIUM 8.9 10.1    PT/INR:  Recent Labs  03/23/13 0413  LABPROT 19.5*  INR 1.70*      Assessment/Plan: S/P Procedure(s) (LRB): AORTIC VALVE REPLACEMENT (AVR) (N/A) CORONARY ARTERY BYPASS GRAFTING (CABG) times one using left internal mammary artery. (N/A) INTRAOPERATIVE TRANSESOPHAGEAL ECHOCARDIOGRAM (N/A)  CV- BPs generally stable, maintaining SR.  Continue Lopressor.  INR bumped from 1.3->1.7 yesterday, but remains  subtherapeutic for mechanical valve.  Will give 5 mg today.  L effusion/edema- CXR stable.  Watch. ? May need thoracentesis.  ESRD- for HD today.  Deconditioning- CRPI, PT.  Constipation- LOC today.  Mild leukocytosis today, no fever.  Continue to watch.  Acute on chronic anemia- H/H stable.  Disp- for SNF at discharge when medically stable.     LOS: 16 days    COLLINS,GINA H 03/24/2013  patient examined and medical record reviewed,agree with above note.  plan L thoracentesis tomorrow prior to SNF next week  VAN TRIGT III,PETER 03/24/2013

## 2013-03-25 ENCOUNTER — Inpatient Hospital Stay (HOSPITAL_COMMUNITY): Payer: Medicare Other

## 2013-03-25 LAB — COMPREHENSIVE METABOLIC PANEL
ALT: 15 U/L (ref 0–35)
AST: 26 U/L (ref 0–37)
Albumin: 2.5 g/dL — ABNORMAL LOW (ref 3.5–5.2)
Alkaline Phosphatase: 77 U/L (ref 39–117)
BUN: 18 mg/dL (ref 6–23)
CO2: 27 mEq/L (ref 19–32)
Calcium: 9.2 mg/dL (ref 8.4–10.5)
Chloride: 93 mEq/L — ABNORMAL LOW (ref 96–112)
Creatinine, Ser: 4.18 mg/dL — ABNORMAL HIGH (ref 0.50–1.10)
GFR calc Af Amer: 13 mL/min — ABNORMAL LOW (ref >90)
GFR calc non Af Amer: 11 mL/min — ABNORMAL LOW (ref >90)
Glucose, Bld: 141 mg/dL — ABNORMAL HIGH (ref 70–99)
Potassium: 3.6 mEq/L — ABNORMAL LOW (ref 3.7–5.3)
Sodium: 137 mEq/L (ref 137–147)
Total Bilirubin: 0.4 mg/dL (ref 0.3–1.2)
Total Protein: 6.6 g/dL (ref 6.0–8.3)

## 2013-03-25 LAB — CBC
HCT: 28.4 % — ABNORMAL LOW (ref 36.0–46.0)
Hemoglobin: 9.3 g/dL — ABNORMAL LOW (ref 12.0–15.0)
MCH: 31.1 pg (ref 26.0–34.0)
MCHC: 32.7 g/dL (ref 30.0–36.0)
MCV: 95 fL (ref 78.0–100.0)
PLATELETS: 185 10*3/uL (ref 150–400)
RBC: 2.99 MIL/uL — AB (ref 3.87–5.11)
RDW: 14.8 % (ref 11.5–15.5)
WBC: 10.9 10*3/uL — ABNORMAL HIGH (ref 4.0–10.5)

## 2013-03-25 LAB — GLUCOSE, CAPILLARY
GLUCOSE-CAPILLARY: 85 mg/dL (ref 70–99)
GLUCOSE-CAPILLARY: 179 mg/dL — AB (ref 70–99)
Glucose-Capillary: 127 mg/dL — ABNORMAL HIGH (ref 70–99)
Glucose-Capillary: 106 mg/dL — ABNORMAL HIGH (ref 70–99)

## 2013-03-25 LAB — PROTIME-INR
INR: 2.2 — ABNORMAL HIGH (ref 0.00–1.49)
Prothrombin Time: 23.7 seconds — ABNORMAL HIGH (ref 11.6–15.2)

## 2013-03-25 MED ORDER — HEPARIN SODIUM (PORCINE) 1000 UNIT/ML DIALYSIS: 1000.0000 [IU] | INTRAMUSCULAR | Status: DC | PRN

## 2013-03-25 MED ORDER — SODIUM CHLORIDE 0.9 % IV SOLN
100.0000 mL | INTRAVENOUS | Status: DC | PRN
Start: 2013-03-25 — End: 2013-03-25

## 2013-03-25 MED ORDER — PENTAFLUOROPROP-TETRAFLUOROETH EX AERO: 1.0000 "application " | INHALATION_SPRAY | CUTANEOUS | Status: DC | PRN

## 2013-03-25 MED ORDER — ALTEPLASE 2 MG IJ SOLR
2.0000 mg | Freq: Once | INTRAMUSCULAR | Status: DC | PRN
  Filled 2013-03-25: qty 2

## 2013-03-25 MED ORDER — LIDOCAINE HCL (PF) 1 % IJ SOLN: 5.0000 mL | INTRAMUSCULAR | Status: DC | PRN

## 2013-03-25 MED ORDER — LIDOCAINE-PRILOCAINE 2.5-2.5 % EX CREA: 1.0000 "application " | TOPICAL_CREAM | CUTANEOUS | Status: DC | PRN

## 2013-03-25 MED ORDER — SODIUM CHLORIDE 0.9 % IV SOLN: 100.0000 mL | INTRAVENOUS | Status: DC | PRN

## 2013-03-25 MED ORDER — WARFARIN SODIUM 4 MG PO TABS
4.0000 mg | ORAL_TABLET | Freq: Every day | ORAL | Status: DC
  Administered 2013-03-25 – 2013-03-26 (×2): 4 mg via ORAL
  Filled 2013-03-25 (×3): qty 1

## 2013-03-25 MED ORDER — NEPRO/CARBSTEADY PO LIQD
237.0000 mL | ORAL | Status: DC | PRN
  Filled 2013-03-25: qty 237

## 2013-03-25 NOTE — Progress Notes (Addendum)
[address]       [place] [address]             [phone]          10 Days Post-Op Procedure(s) (LRB): AORTIC VALVE REPLACEMENT (AVR) (N/A) CORONARY ARTERY BYPASS GRAFTING (CABG) times one using left internal mammary artery. (N/A) INTRAOPERATIVE TRANSESOPHAGEAL ECHOCARDIOGRAM (N/A)  Subjective: Pt seen while on HD.  States she is comfortable, feeling better since she had a BM last night.  Breathing stable.   Objective: Vital signs in last 24 hours: Patient Vitals for the past 24 hrs:  BP Temp Temp src Pulse Resp SpO2 Weight  03/25/13 1100 135/73 mmHg - - 86 - - -  03/25/13 1030 83/55 mmHg - - 89 - - -  03/25/13 1000 101/62 mmHg - - 88 - - -  03/25/13 0930 117/64 mmHg - - 83 - - -  03/25/13 0900 115/69 mmHg - - 84 - - -  03/25/13 0834 87/45 mmHg - - 83 - - -  03/25/13 0829 76/49 mmHg - - 79 - - -  03/25/13 0759 131/70 mmHg - - 74 - - -  03/25/13 0730 158/68 mmHg - - 80 - - -  03/25/13 0707 179/79 mmHg - - 100 - 98 % 167 lb 1.7 oz (75.8 kg)  03/25/13 0644 151/67 mmHg 97.6 F (36.4 C) Oral 79 19 98 % -  03/25/13 0426 155/65 mmHg 97.4 F (36.3 C) Oral 87 20 99 % 165 lb 9.1 oz (75.1 kg)  03/24/13 2107 165/73 mmHg 98 F (36.7 C) Oral 95 18 100 % -  03/24/13 2018 - - - - - 97 % -  03/24/13 1512 135/70 mmHg 98.6 F (37 C) Oral 93 16 98 % -  03/24/13 1432 133/62 mmHg 98.4 F (36.9 C) Oral 85 14 98 % 172 lb 6.4 oz (78.2 kg)  03/24/13 1430 145/69 mmHg - - 90 - - -  03/24/13 1400 110/57 mmHg - - 90 - - -  03/24/13 1330 99/61 mmHg - - 92 - - -  03/24/13 1300 139/72 mmHg - - 83 - - -  03/24/13 1230 129/61 mmHg - - 81 - - -  03/24/13 1200 109/56 mmHg - - 81 - - -  03/24/13 1130 101/54 mmHg - - 80 - - -   Current Weight  03/25/13 167 lb 1.7 oz (75.8 kg)  PRE-OPERATIVE WEIGHT: 74 kg    Intake/Output from previous day: 02/12 0701 - 02/13 0700 In: -  Out: 2860   CBGs [phone]    PHYSICAL EXAM:  Heart: RRR, good valve click Lungs:  Overall diminished BS on L with crackles Wound: Clean and dry Extremities: No LE edema    Lab Results: CBC: Recent Labs  03/24/13 0550 03/25/13 0338  WBC 12.8* 10.9*  HGB 10.7* 9.3*  HCT 33.2* 28.4*  PLT 214 185   BMET:  Recent Labs  03/24/13 0550 03/25/13 0338  NA 135* 137  K 4.2 3.6*  CL 92* 93*  CO2 24 27  GLUCOSE 153* 141*  BUN 29* 18  CREATININE 5.45* 4.18*  CALCIUM 10.1 9.2    PT/INR:  Recent Labs  03/25/13 0338  LABPROT 23.7*  INR 2.20*      Assessment/Plan: S/P Procedure(s) (LRB): AORTIC VALVE REPLACEMENT (AVR) (N/A) CORONARY ARTERY BYPASS GRAFTING (CABG) times one using left internal mammary artery. (N/A) INTRAOPERATIVE TRANSESOPHAGEAL ECHOCARDIOGRAM (N/A) CV- BPs generally stable, maintaining SR. Continue Lopressor.  L  effusion/edema- Follow up CXR in am. May need thoracentesis. Will continue to hold Coumadin in anticipation. ESRD-  HD per renal. Deconditioning- CRPI, PT.  Mild leukocytosis, WBC trending down. Acute on chronic anemia- H/H stable.  Some confusion, now resolved.  All pain meds d/c'ed.  Per PVT, could use ibuprofen if she needs pain med. Disp- for SNF at discharge when medically stable, possibly first of next week.    LOS: 17 days    COLLINS,GINA H 03/25/2013  L pleural effusion not sig by U/S Cont daily coumadin Incisions clean SNF Mon

## 2013-03-25 NOTE — Progress Notes (Signed)
Physical Therapy Treatment Patient Details Name: [name] MRN: [id number] DOB: 03-Apr-1955 Today's Date: 03/25/2013 Time: 3329-5188 PT Time Calculation (min): 20 min  PT Assessment / Plan / Recommendation  History of Present Illness pt admitted with CAD and SOB, s/p AVR and CABG x1   PT Comments   Pt limited by fatigue and decr balance this date. Continues to need instruction in sternal precautions. Do not feel she is safe to be alone for any period of time during the day and doesn't have 24 hr assist per chart.    Follow Up Recommendations  SNF     Does the patient have the potential to tolerate intense rehabilitation     Barriers to Discharge        Equipment Recommendations  None recommended by PT    Recommendations for Other Services    Frequency Min 2X/week   Progress towards PT Goals Progress towards PT goals: Not progressing toward goals - comment (pt fatigued from dialysis)  Plan Frequency needs to be updated    Precautions / Restrictions Precautions Precautions: Fall;Sternal Restrictions Other Position/Activity Restrictions: pt able to state 1 sternal precaution (no lifting)   Pertinent Vitals/Pain SaO2 93-94% on RA with activity    Mobility  Bed Mobility Overal bed mobility: Needs Assistance Bed Mobility: Sit to Supine Sit to supine: Supervision General bed mobility comments: scooting to [facility] pt began to reach and pull on bedrails with bil UEs (even after instructed in sternal precautions); required assist to scoot to [facility] Transfers Overall transfer level: Needs assistance Equipment used: Rolling walker (2 wheeled) (pushed w/c) Transfers: Sit to/from Stand Sit to Stand: Min guard General transfer comment: used momentum to stand from EOB without use of UEs; vc to maintain sternal precautions Ambulation/Gait Ambulation/Gait assistance: Min assist Ambulation Distance (Feet): 100 Feet Assistive device: Rolling walker (2 wheeled) (pushing w/c) Gait  Pattern/deviations: Step-through pattern;Decreased stride length;Trunk flexed;Drifts right/left Gait velocity: pt very fatigued with standing rest x 2; frequent cues for safe use of RW    Exercises     PT Diagnosis:    PT Problem List:   PT Treatment Interventions:     PT Goals (current goals can now be found in the care plan section)    Visit Information  Last PT Received On: 03/25/13 Assistance Needed: +1 (for lines only) History of Present Illness: pt admitted with CAD and SOB, s/p AVR and CABG x1    Subjective Data      Cognition  Cognition Arousal/Alertness: Awake/alert Behavior During Therapy: Flat affect (reports fatigued after HD) Overall Cognitive Status: Within Functional Limits for tasks assessed (pt moderately confused today)    Balance  Balance Sitting balance-Leahy Scale: Good Standing balance-Leahy Scale: Poor Standing balance comment: requires bil UE support for balance   End of Session PT - End of Session Equipment Utilized During Treatment: Gait belt Activity Tolerance: Patient limited by fatigue ( ) Patient left: with call bell/phone within reach;in bed   GP     Antavion Bartoszek 03/25/2013, 4:46 PM Pager [phone]

## 2013-03-25 NOTE — Progress Notes (Signed)
Removed CT sutures  And central line to the left chest per MD order per hospital policy. Patient tolerated well. Applied benzoin and steri strips to CT suture site. Applied pressure and guaze to left chest for 10 minutes. Will continue to monitor closely. [place] Nurse, RN

## 2013-03-25 NOTE — Progress Notes (Signed)
Subjective:  Seen on dialysis, no complaints, confused yesterday secondary to pain meds, now ibuprofen prn  Objective: Vital signs in last 24 hours: Temp:  [97.4 F (36.3 C)-98.6 F (37 C)] 97.6 F (36.4 C) (02/13 0644) Pulse Rate:  [78-100] 100 (02/13 0707) Resp:  [14-20] 19 (02/13 0644) BP: (99-179)/(54-79) 179/79 mmHg (02/13 0707) SpO2:  [95 %-100 %] 98 % (02/13 0707) Weight:  [75.1 kg (165 lb 9.1 oz)-80.6 kg (177 lb 11.1 oz)] 75.8 kg (167 lb 1.7 oz) (02/13 0707) Weight change: 2 kg (4 lb 6.6 oz)  Intake/Output from previous day: 02/12 0701 - 02/13 0700 In: -  Out: 2860    Lab Results:  Recent Labs  03/24/13 0550 03/25/13 0338  WBC 12.8* 10.9*  HGB 10.7* 9.3*  HCT 33.2* 28.4*  PLT 214 185   BMET:  Recent Labs  03/23/13 0413 03/24/13 0550 03/25/13 0338  NA 139 135* 137  K 3.8 4.2 3.6*  CL 96 92* 93*  CO2 30 24 27   GLUCOSE 132* 153* 141*  BUN 17 29* 18  CREATININE 3.62* 5.45* 4.18*  CALCIUM 8.9 10.1 9.2  ALBUMIN 2.2*  --  2.5*   No results found for this basename: PTH,  in the last 72 hours Iron Studies: No results found for this basename: IRON, TIBC, TRANSFERRIN, FERRITIN,  in the last 72 hours  EXAM: General appearance:  Alert, in no apparent distress Resp:  CTA without rales, rhonchi, or wheezes Cardio:  RRR without murmur or rub GI: + BS, soft and nontender Extremities:  No edema Access:  L thigh AVG with BFR 400  Dialysis: TTS [place]  4h 74kg 2K/2.25 Bath L thigh AVG No heparin  Hect 4 EPO 1000 Venofer 50 mg/wk   Assessment/Plan: 1. Severe aortic valve stenosis/CAD - aortic valve replacement & CABG x 1 per Dr. Prescott Gum 2/3.  2. AMS - sec to pain meds, MS now @ baseline ; tramadol / oxycodone d/c'd, prn ibuprofen for pain 3. ESRD - HD on TTS @ [facility], off schedule, K 3.6. HD today.  4. HTN/Volume - BP 179/79 on Metoprolol 25 mg bid; wt 75.8 kg with EDW 74, UF goal 4 L today.  5. Anemia - Hgb 9.3, Aranesp 60 mcg on Mon.  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  7. DM - insulin per primary.  8. Liver transplant - on Prograf     LOS: 17 days   LYLES,CHARLES 03/25/2013,7:22 AM  I have seen and examined patient, discussed with PA and agree with assessment and plan as outlined above. [name] MD pager [phone]    cell [phone] 03/25/2013, 1:02 PM

## 2013-03-26 ENCOUNTER — Inpatient Hospital Stay (HOSPITAL_COMMUNITY): Payer: Medicare Other

## 2013-03-26 LAB — GLUCOSE, CAPILLARY
GLUCOSE-CAPILLARY: 159 mg/dL — AB (ref 70–99)
GLUCOSE-CAPILLARY: 157 mg/dL — AB (ref 70–99)
GLUCOSE-CAPILLARY: 204 mg/dL — AB (ref 70–99)
Glucose-Capillary: 175 mg/dL — ABNORMAL HIGH (ref 70–99)

## 2013-03-26 LAB — PROTIME-INR
INR: 2.28 — ABNORMAL HIGH (ref 0.00–1.49)
Prothrombin Time: 24.4 seconds — ABNORMAL HIGH (ref 11.6–15.2)

## 2013-03-26 NOTE — Progress Notes (Signed)
Subjective:  No complaints, ready for discharge  Objective: Vital signs in last 24 hours: Temp:  [97.8 F (36.6 C)-98.8 F (37.1 C)] 98.8 F (37.1 C) (02/13 2203) Pulse Rate:  [80-89] 88 (02/14 0639) Resp:  [16-18] 18 (02/14 0639) BP: (83-138)/(48-79) 120/52 mmHg (02/14 0639) SpO2:  [97 %-99 %] 99 % (02/14 0639) Weight:  [72.258 kg (159 lb 4.8 oz)-72.5 kg (159 lb 13.3 oz)] 72.258 kg (159 lb 4.8 oz) (02/14 0700) Weight change: -4.8 kg (-10 lb 9.3 oz)  Intake/Output from previous day: 02/13 0701 - 02/14 0700 In: 0  Out: 3291    Lab Results:  Recent Labs  03/24/13 0550 03/25/13 0338  WBC 12.8* 10.9*  HGB 10.7* 9.3*  HCT 33.2* 28.4*  PLT 214 185   BMET:  Recent Labs  03/24/13 0550 03/25/13 0338  NA 135* 137  K 4.2 3.6*  CL 92* 93*  CO2 24 27  GLUCOSE 153* 141*  BUN 29* 18  CREATININE 5.45* 4.18*  CALCIUM 10.1 9.2  ALBUMIN  --  2.5*   No results found for this basename: PTH,  in the last 72 hours Iron Studies: No results found for this basename: IRON, TIBC, TRANSFERRIN, FERRITIN,  in the last 72 hours  EXAM:  General appearance: Alert, in no apparent distress  Resp: CTA without rales, rhonchi, or wheezes  Cardio: RRR without murmur or rub  GI: + BS, soft and nontender  Extremities: No edema  Access: L thigh AVG with + bruit   Dialysis: TTS [facility]  4h 74kg 2K/2.25 Bath L thigh AVG No heparin  Hect 4 EPO 1000 Venofer 50 mg/wk   Assessment/Plan: 1. Severe aortic valve stenosis/CAD - aortic valve replacement & CABG x 1 per Dr. Prescott Gum 2/3.  2. AMS - sec to pain meds, MS now @ baseline; tramadol / oxycodone d/c'd, prn ibuprofen for pain.  3. ESRD - HD on TTS @ [facility], HD yesterday per new schedule MWF at discharge. K 3.6.  Next HD 2/16.  4. HTN/Volume - BP 120/52 on Metoprolol 25 mg bid; wt 72.3 kg s/p net UF 3.3 L yesterday.  5. Anemia - Hgb 9.3, Aranesp 60 mcg on Mon.  6. 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   7. DM - insulin per primary.  8. 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 [name] MD pager [phone]    cell [phone] 03/26/2013, 10:54 AM

## 2013-03-26 NOTE — Progress Notes (Addendum)
      [address]       [place] [address]             [phone]      11 Days Post-Op Procedure(s) (LRB): AORTIC VALVE REPLACEMENT (AVR) (N/A) CORONARY ARTERY BYPASS GRAFTING (CABG) times one using left internal mammary artery. (N/A) INTRAOPERATIVE TRANSESOPHAGEAL ECHOCARDIOGRAM (N/A)  Subjective:  [name] [name] has no complaints this morning.  States she is feeling pretty good.  She has been dialyzed the past 2 days, and states they are hoping she can be back on her regular schedule of T/TH,Sat  Objective: Vital signs in last 24 hours: Temp:  [97.8 F (36.6 C)-98.8 F (37.1 C)] 98.8 F (37.1 C) (02/13 2203) Pulse Rate:  [79-89] 88 (02/14 0639) Cardiac Rhythm:  [-] Normal sinus rhythm (02/13 2000) Resp:  [16-18] 18 (02/14 0639) BP: (76-138)/(45-79) 120/52 mmHg (02/14 0639) SpO2:  [97 %-99 %] 99 % (02/14 0639) Weight:  [159 lb 4.8 oz (72.258 kg)-159 lb 13.3 oz (72.5 kg)] 159 lb 4.8 oz (72.258 kg) (02/14 0700)  Intake/Output from previous day: 02/13 0701 - 02/14 0700 In: 0  Out: 3291   General appearance: alert, cooperative and no distress Heart: regular rate and rhythm Lungs: diminished breath sounds bibasilar Abdomen: soft, non-tender; bowel sounds normal; no masses,  no organomegaly Extremities: edema trace Wound: clean and dyr  Lab Results:  Recent Labs  03/24/13 0550 03/25/13 0338  WBC 12.8* 10.9*  HGB 10.7* 9.3*  HCT 33.2* 28.4*  PLT 214 185   BMET:  Recent Labs  03/24/13 0550 03/25/13 0338  NA 135* 137  K 4.2 3.6*  CL 92* 93*  CO2 24 27  GLUCOSE 153* 141*  BUN 29* 18  CREATININE 5.45* 4.18*  CALCIUM 10.1 9.2    PT/INR:  Recent Labs  03/25/13 0338  LABPROT 23.7*  INR 2.20*   ABG    Component Value Date/Time   PHART 7.470* 03/19/2013 1455   HCO3 30.5* 03/19/2013 1455   TCO2 32 03/19/2013 1455   ACIDBASEDEF 6.0* 03/16/2013 0912   O2SAT 97.0 03/19/2013 1455   CBG (last 3)   Recent Labs  03/25/13 1653 03/25/13 2159  03/26/13 0632  GLUCAP 179* 106* 159*    Assessment/Plan: S/P Procedure(s) (LRB): AORTIC VALVE REPLACEMENT (AVR) (N/A) CORONARY ARTERY BYPASS GRAFTING (CABG) times one using left internal mammary artery. (N/A) INTRAOPERATIVE TRANSESOPHAGEAL ECHOCARDIOGRAM (N/A)  1. CV- NSR, good rate and pressure control- continue Lopressor 2. Pulm- CXR without significant pleural effusions, some atelectasis, continue IS 3. ESRD- Nephrology following, dialyzed yesterday 4. INR- not drawn today, on Coumadin at 4 mg daily 5. Deconditioning- will need SNF 6. Dispo- patient stable, continue current care, hopefully to SNF early next week pending dialysis schedule   LOS: 18 days    [name] [name] 03/26/2013  patient examined and medical record reviewed,agree with above note. [name] [name] 03/26/2013

## 2013-03-26 NOTE — Progress Notes (Signed)
03/26/2013 10:38 AM Hemodialysis Outpatient Note; this patient has been accepted at [facility] location on a Monday,Wednesday and Friday 2nd shift schedule. That is the only information I was given and I was told by Delaine Lame that her secretary Sharyn Lull took care of everything yesterday. If you have any questions please call Delaine Lame at [phone]. Thank you.Gordy Savers

## 2013-03-26 NOTE — Progress Notes (Signed)
CARDIAC REHAB PHASE I   PRE:  Rate/Rhythm: SR 78  BP:  Supine:   Sitting: 112/51  Standing:    SaO2: 98 RA   MODE:  Ambulation: 225 ft   POST:  Rate/Rhythm: SR  88  BP:  Supine:   Sitting: 131/53   Standing:    SaO2: 95  RA Ambulated in hallway x 1 assist and RW.  Pt gait slow and steady.  Pt took one standing rest break, Pt to chair, call bell in place. Cherre Huger, BSN [phone], [place]

## 2013-03-27 LAB — GLUCOSE, CAPILLARY
GLUCOSE-CAPILLARY: 237 mg/dL — AB (ref 70–99)
GLUCOSE-CAPILLARY: 110 mg/dL — AB (ref 70–99)
Glucose-Capillary: 160 mg/dL — ABNORMAL HIGH (ref 70–99)
Glucose-Capillary: 155 mg/dL — ABNORMAL HIGH (ref 70–99)

## 2013-03-27 LAB — CBC
HCT: 30.9 % — ABNORMAL LOW (ref 36.0–46.0)
Hemoglobin: 10.1 g/dL — ABNORMAL LOW (ref 12.0–15.0)
MCH: 31.3 pg (ref 26.0–34.0)
MCHC: 32.7 g/dL (ref 30.0–36.0)
MCV: 95.7 fL (ref 78.0–100.0)
Platelets: 177 10*3/uL (ref 150–400)
RBC: 3.23 MIL/uL — ABNORMAL LOW (ref 3.87–5.11)
RDW: 15.4 % (ref 11.5–15.5)
WBC: 11.3 10*3/uL — ABNORMAL HIGH (ref 4.0–10.5)

## 2013-03-27 LAB — BASIC METABOLIC PANEL
BUN: 20 mg/dL (ref 6–23)
CO2: 28 mEq/L (ref 19–32)
Calcium: 9.7 mg/dL (ref 8.4–10.5)
Chloride: 96 mEq/L (ref 96–112)
Creatinine, Ser: 5.86 mg/dL — ABNORMAL HIGH (ref 0.50–1.10)
GFR calc Af Amer: 8 mL/min — ABNORMAL LOW (ref >90)
GFR calc non Af Amer: 7 mL/min — ABNORMAL LOW (ref >90)
Glucose, Bld: 164 mg/dL — ABNORMAL HIGH (ref 70–99)
POTASSIUM: 3.6 meq/L — AB (ref 3.7–5.3)
Sodium: 136 mEq/L — ABNORMAL LOW (ref 137–147)

## 2013-03-27 LAB — PROTIME-INR
INR: 2.95 — ABNORMAL HIGH (ref 0.00–1.49)
Prothrombin Time: 29.7 seconds — ABNORMAL HIGH (ref 11.6–15.2)

## 2013-03-27 MED ORDER — WHITE PETROLATUM GEL
Status: DC | PRN
  Administered 2013-03-27: 0.2 via TOPICAL
  Filled 2013-03-27 (×2): qty 5

## 2013-03-27 MED ORDER — WARFARIN SODIUM 2 MG PO TABS
2.0000 mg | ORAL_TABLET | Freq: Every day | ORAL | Status: DC
  Administered 2013-03-27 – 2013-03-28 (×2): 2 mg via ORAL
  Filled 2013-03-27 (×3): qty 1

## 2013-03-27 NOTE — Progress Notes (Signed)
Subjective:  No complaints, slightly unstable when standing, discharge to SNF likely tomorrow  Objective: Vital signs in last 24 hours: Temp:  [97.8 F (36.6 C)-98.4 F (36.9 C)] 97.8 F (36.6 C) (02/15 0416) Pulse Rate:  [74-88] 74 (02/15 0416) Resp:  [18-21] 18 (02/15 0416) BP: (96-125)/(41-67) 96/56 mmHg (02/15 0416) SpO2:  [99 %-100 %] 99 % (02/15 0416) Weight:  [73.165 kg (161 lb 4.8 oz)] 73.165 kg (161 lb 4.8 oz) (02/15 0416) Weight change: -2.635 kg (-5 lb 12.9 oz)  Intake/Output from previous day: 02/14 0701 - 02/15 0700 In: 360 [P.O.:360] Out: -    Lab Results:  Recent Labs  03/25/13 0338 03/27/13 0620  WBC 10.9* 11.3*  HGB 9.3* 10.1*  HCT 28.4* 30.9*  PLT 185 177   BMET:  Recent Labs  03/25/13 0338 03/27/13 0620  NA 137 136*  K 3.6* 3.6*  CL 93* 96  CO2 27 28  GLUCOSE 141* 164*  BUN 18 20  CREATININE 4.18* 5.86*  CALCIUM 9.2 9.7  ALBUMIN 2.5*  --    No results found for this basename: PTH,  in the last 72 hours Iron Studies: No results found for this basename: IRON, TIBC, TRANSFERRIN, FERRITIN,  in the last 72 hours  EXAM:  General appearance: Alert, in no apparent distress  Resp: CTA without rales, rhonchi, or wheezes  Cardio: RRR without murmur or rub  GI: + BS, soft and nontender  Extremities: No edema  Access: L thigh AVG with + bruit   Dialysis: TTS [facility]  4h 74kg 2K/2.25 Bath L thigh AVG No heparin  Hect 4 EPO 1000 Venofer 50 mg/wk   Assessment/Plan: 1. Severe aortic valve stenosis/CAD - aortic valve replacement & CABG x 1 per Dr. Prescott Gum 2/3.  2. AMS - sec to pain meds, MS now @ baseline; tramadol / oxycodone d/c'd, prn ibuprofen for pain.  3. ESRD - HD on TTS @ [facility], HD MWF @ [place] after discharge to SNF, K 3.6.  Next HD tomorrow.  4. HTN/Volume - BP 96/56 on Metoprolol 25 mg bid; wt 73.2 kg, below EDW.  5. Anemia - Hgb 10.1, Aranesp 60 mcg on Mon.  6. Sec HPT - Ca 9.7 (10.9 corrected); Hectorol 4 mcg, Sensipar 30 mg qd, no  binders.  Renal panel pre-HD, 2Ca bath.  7. DM - insulin per primary.  8. 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     LOS: 19 days   LYLES,CHARLES 03/27/2013,8:45 AM  I have seen and examined patient, discussed with PA and agree with assessment and plan as outlined above. [name] MD pager [phone]    cell [phone] 03/27/2013, 1:20 PM

## 2013-03-27 NOTE — Progress Notes (Addendum)
      [address]       [place] [address]             [phone]      12 Days Post-Op Procedure(s) (LRB): AORTIC VALVE REPLACEMENT (AVR) (N/A) CORONARY ARTERY BYPASS GRAFTING (CABG) times one using left internal mammary artery. (N/A) INTRAOPERATIVE TRANSESOPHAGEAL ECHOCARDIOGRAM (N/A)  Subjective:  Ms. Suleiman has no complaints this morning.  She is ambulating with assistance.  Planning for SNF at discharge  Objective: Vital signs in last 24 hours: Temp:  [97.8 F (36.6 C)-98.4 F (36.9 C)] 97.8 F (36.6 C) (02/15 0416) Pulse Rate:  [74-88] 74 (02/15 0416) Cardiac Rhythm:  [-] Normal sinus rhythm (02/14 2015) Resp:  [18-21] 18 (02/15 0416) BP: (96-125)/(41-67) 96/56 mmHg (02/15 0416) SpO2:  [99 %-100 %] 99 % (02/15 0416) Weight:  [161 lb 4.8 oz (73.165 kg)] 161 lb 4.8 oz (73.165 kg) (02/15 0416)  Intake/Output from previous day: 02/14 0701 - 02/15 0700 In: 360 [P.O.:360] Out: -   General appearance: alert, cooperative and no distress Heart: regular rate and rhythm Lungs: clear to auscultation bilaterally Abdomen: soft, non-tender; bowel sounds normal; no masses,  no organomegaly Extremities: edema trace Wound: clean and dry  Lab Results:  Recent Labs  03/25/13 0338 03/27/13 0620  WBC 10.9* 11.3*  HGB 9.3* 10.1*  HCT 28.4* 30.9*  PLT 185 177   BMET:  Recent Labs  03/25/13 0338 03/27/13 0620  NA 137 136*  K 3.6* 3.6*  CL 93* 96  CO2 27 28  GLUCOSE 141* 164*  BUN 18 20  CREATININE 4.18* 5.86*  CALCIUM 9.2 9.7    PT/INR:  Recent Labs  03/27/13 0620  LABPROT 29.7*  INR 2.95*   ABG    Component Value Date/Time   PHART 7.470* 03/19/2013 1455   HCO3 30.5* 03/19/2013 1455   TCO2 32 03/19/2013 1455   ACIDBASEDEF 6.0* 03/16/2013 0912   O2SAT 97.0 03/19/2013 1455   CBG (last 3)   Recent Labs  03/26/13 1812 03/26/13 2111 03/27/13 0631  GLUCAP 204* 175* 160*    Assessment/Plan: S/P Procedure(s) (LRB): AORTIC VALVE REPLACEMENT  (AVR) (N/A) CORONARY ARTERY BYPASS GRAFTING (CABG) times one using left internal mammary artery. (N/A) INTRAOPERATIVE TRANSESOPHAGEAL ECHOCARDIOGRAM (N/A)  1. CV- NSR continue Lopressor 2. Pulm- no acute issues, not on oxygen, continue IS 3. Renal- ESRD, dialysis will now be MWF, Nephrology following 4. INR 2.95- will decrease Coumadin to 2 mg daily 5. Deconditioning- needs SNF 6. Dispo- patient medically stable, dialysis scheduled for tomorrow, possibly SNF after dialysis vs. Tuesday   LOS: 19 days    BARRETT, ERIN 03/27/2013  Patient doing well Plan HD tomorrow then probably transfer to skilled nursing facility Patient will need INR drawn Wednesday, February 18 with results to the [facility] Coumadin clinic

## 2013-03-28 LAB — RENAL FUNCTION PANEL
ALBUMIN: 2.6 g/dL — AB (ref 3.5–5.2)
BUN: 30 mg/dL — AB (ref 6–23)
CHLORIDE: 90 meq/L — AB (ref 96–112)
CO2: 25 mEq/L (ref 19–32)
Calcium: 10.2 mg/dL (ref 8.4–10.5)
Creatinine, Ser: 7.82 mg/dL — ABNORMAL HIGH (ref 0.50–1.10)
GFR calc Af Amer: 6 mL/min — ABNORMAL LOW (ref >90)
GFR calc non Af Amer: 5 mL/min — ABNORMAL LOW (ref >90)
Glucose, Bld: 192 mg/dL — ABNORMAL HIGH (ref 70–99)
PHOSPHORUS: 2.6 mg/dL (ref 2.3–4.6)
POTASSIUM: 3.5 meq/L — AB (ref 3.7–5.3)
Sodium: 130 mEq/L — ABNORMAL LOW (ref 137–147)

## 2013-03-28 LAB — GLUCOSE, CAPILLARY
GLUCOSE-CAPILLARY: 182 mg/dL — AB (ref 70–99)
GLUCOSE-CAPILLARY: 166 mg/dL — AB (ref 70–99)
Glucose-Capillary: 106 mg/dL — ABNORMAL HIGH (ref 70–99)
Glucose-Capillary: 123 mg/dL — ABNORMAL HIGH (ref 70–99)

## 2013-03-28 LAB — CBC
HCT: 32.7 % — ABNORMAL LOW (ref 36.0–46.0)
HEMOGLOBIN: 10.8 g/dL — AB (ref 12.0–15.0)
MCH: 31.1 pg (ref 26.0–34.0)
MCHC: 33 g/dL (ref 30.0–36.0)
MCV: 94.2 fL (ref 78.0–100.0)
Platelets: 190 10*3/uL (ref 150–400)
RBC: 3.47 MIL/uL — ABNORMAL LOW (ref 3.87–5.11)
RDW: 15.2 % (ref 11.5–15.5)
WBC: 12.5 10*3/uL — AB (ref 4.0–10.5)

## 2013-03-28 LAB — PROTIME-INR
INR: 3.25 — AB (ref 0.00–1.49)
PROTHROMBIN TIME: 32 s — AB (ref 11.6–15.2)

## 2013-03-28 LAB — HEPATITIS B SURFACE ANTIGEN: Hepatitis B Surface Ag: NEGATIVE

## 2013-03-28 MED ORDER — SODIUM CHLORIDE 0.9 % IV SOLN: 100.0000 mL | INTRAVENOUS | Status: DC | PRN

## 2013-03-28 MED ORDER — HEPARIN SODIUM (PORCINE) 1000 UNIT/ML DIALYSIS
1000.0000 [IU] | INTRAMUSCULAR | Status: DC | PRN
  Filled 2013-03-28: qty 1

## 2013-03-28 MED ORDER — LIDOCAINE HCL (PF) 1 % IJ SOLN: 5.0000 mL | INTRAMUSCULAR | Status: DC | PRN

## 2013-03-28 MED ORDER — DARBEPOETIN ALFA-POLYSORBATE 60 MCG/0.3ML IJ SOLN
INTRAMUSCULAR | Status: AC
  Filled 2013-03-28: qty 0.3

## 2013-03-28 MED ORDER — ALTEPLASE 2 MG IJ SOLR: 2.0000 mg | Freq: Once | INTRAMUSCULAR | Status: DC | PRN

## 2013-03-28 MED ORDER — PENTAFLUOROPROP-TETRAFLUOROETH EX AERO: 1.0000 "application " | INHALATION_SPRAY | CUTANEOUS | Status: DC | PRN

## 2013-03-28 MED ORDER — ALTEPLASE 2 MG IJ SOLR
2.0000 mg | Freq: Once | INTRAMUSCULAR | Status: DC | PRN
  Filled 2013-03-28: qty 2

## 2013-03-28 MED ORDER — NEPRO/CARBSTEADY PO LIQD: 237.0000 mL | ORAL | Status: DC | PRN

## 2013-03-28 MED ORDER — LIDOCAINE-PRILOCAINE 2.5-2.5 % EX CREA
1.0000 | TOPICAL_CREAM | CUTANEOUS | Status: DC | PRN
Start: 2013-03-28 — End: 2013-03-28
  Filled 2013-03-28: qty 5

## 2013-03-28 MED ORDER — LIDOCAINE-PRILOCAINE 2.5-2.5 % EX CREA: 1.0000 "application " | TOPICAL_CREAM | CUTANEOUS | Status: DC | PRN

## 2013-03-28 MED ORDER — HEPARIN SODIUM (PORCINE) 1000 UNIT/ML DIALYSIS: 1000.0000 [IU] | INTRAMUSCULAR | Status: DC | PRN

## 2013-03-28 MED ORDER — NEPRO/CARBSTEADY PO LIQD
237.0000 mL | ORAL | Status: DC | PRN
  Filled 2013-03-28: qty 237

## 2013-03-28 NOTE — Progress Notes (Addendum)
Update: CSW called [facility], and at this time, [facility] is not accepting patients for the remainder of this day due to their pharmacy closing at [place] continuing to follow patient for dc to [facility] SNF when medically stable.  Jeanette Caprice, MSW, [place]

## 2013-03-28 NOTE — Discharge Summary (Signed)
[address]       [place] [address]             [phone]       [name] Apr 07, 1955 58 y.o. [id number]  03/08/2013   Ivin Poot, MD  CHEST PAIN Chest pain SEVERE AS CAD   HPI:  This is a 58 y.o. female patient with multiple medical comorbidities including end-stage renal disease, diabetes and previous liver transplant. Over the past 2 months she has had increasing exertional chest pain and shortness of breath. In December of last year she was hospitalized for pneumonia. More recently she had an episode of hypotension and chest pain during a hemodialysis session. She has had a cardiac catheterization in the past 6 months however was felt to require admission this hospitalization for further evaluation and treatment of her aortic stenosis. Past Medical History   Diagnosis  Date   .  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  Chronic back pain    .  CAD (coronary artery disease)    .  Obesity    .  Peripheral vascular disease  hands and legs   .  Anxiety    .  Asthma    .  GERD (gastroesophageal reflux disease)      takes Omeprazole daily   .  Chronic constipation      takes MIralax and Colace daily   .  Anemia      takes Folic Acid daily   .  Hypothyroidism      takes Synthroid daily   .  Depression      takes Cymbalta for "severe" depression   .  Neuromuscular disorder      carpal tunnel in right hand   .  Hypertension      takes Metoprolol and Lisinopril daily, sees Dr Bea Graff   .  COPD (chronic obstructive pulmonary disease)    .  Pneumonia      "today and several times before" (08/30/2012)   .  Chronic bronchitis      "q yr w/season changes" (08/30/2012)   .  Type II diabetes mellitus      Levemir 2units daily if > 150   .  History of blood transfusion      "several" (08/30/2012)   .  Hepatitis C    .  Migraine      "last migraine was in 2013"  (08/30/2012)   .  Headache      "at least monthly" (08/30/2012)   .  Arthritis      "left hand, back" (08/30/2012)   .  End stage renal disease on dialysis  02/27/2011     Kidneys shut down at time of liver transplant in Sept 2011 at [facility] in [place], she has been on HD ever since. Dialyzes at [facility] HD on TTS schedule. Had L forearm graft used 10 months then removed Dec 2012 due to suspected infection. A right upper arm AVG was placed Dec 2012 but she developed steal symptoms acutely and it was ligated the same day. Never had an AV fistula due to small veins. Now has L thigh AVG put in Jan 2013, has not clotted to date.    Past Surgical History   Procedure  Laterality  Date   .  Liver transplant   10/25/2009  sees Dr Ferol Luz 1 every 6 months, saw last in Dec 2013. [name] Coord [phone]   .  Small intestine surgery   90's   .  Thrombectomy     .  Arteriovenous graft placement  Left  10/03/10     forearm   .  Avgg removal   12/23/2010     Procedure: REMOVAL OF ARTERIOVENOUS GORETEX GRAFT ([place]); Surgeon: Elam Dutch, MD; Location: [place]; Service: Vascular; Laterality: Left; procedure started @1736 -1852   .  Insertion of dialysis catheter   12/23/2010     Procedure: INSERTION OF DIALYSIS CATHETER; Surgeon: Elam Dutch, MD; Location: [place]; Service: Vascular; Laterality: Right; Right Internal Jugular 28cm dialysis catheter insertion procedure time 1701-1720   .  Cholecystectomy   1993   .  Cystoscopy   1990's   .  Spinal growth rods   2010     "put 2 metal rods in my back; they had detetriorated" (08/30/2012)   .  Av fistula placement   01/29/2011     Procedure: INSERTION OF ARTERIOVENOUS (AV) GORE-TEX GRAFT ARM; Surgeon: Elam Dutch, MD; Location: [place]; Service: Vascular; Laterality: Right;   .  Av fistula placement   03/10/2011     Procedure: INSERTION OF ARTERIOVENOUS (AV) GORE-TEX GRAFT THIGH; Surgeon: Elam Dutch, MD; Location: [place]; Service: Vascular;  Laterality: Left;   .  Tubal ligation   1990's   .  Cardiac catheterization   2014    History   Smoking status   .  Current Every Day Smoker -- 0.75 packs/day for 40 years   .  Types:  Cigarettes   Smokeless tobacco   .  Never Used    History   Alcohol Use   .  Yes     Comment: 08/30/2012 "last drink was at a wedding July, 2014; had a small glass of wine; never had problems w/alcohol"    History    Social History   .  Marital Status:  Divorced     Spouse Name:  N/A     Number of Children:  N/A   .  Years of Education:  N/A    Occupational History   .  Not on file.    Social History Main Topics   .  Smoking status:  Current Every Day Smoker -- 0.75 packs/day for 40 years     Types:  Cigarettes   .  Smokeless tobacco:  Never Used   .  Alcohol Use:  Yes      Comment: 08/30/2012 "last drink was at a wedding July, 2014; had a small glass of wine; never had problems w/alcohol"   .  Drug Use:  No   .  Sexual Activity:  Not Currently    Other Topics  Concern   .  Not on file    Social History Narrative   .  No narrative on file    Allergies   Allergen  Reactions   .  Acetaminophen  Other (See Comments)     Liver transplant recipient   .  Codeine  Itching    Prescriptions prior to admission   Medication  Sig  Dispense  Refill   .  albuterol (PROVENTIL,VENTOLIN) 90 MCG/ACT inhaler  Inhale 1-2 puffs into the lungs every 6 (six) hours as needed for wheezing or shortness of breath.     [name]  aspirin EC 81 MG tablet  Take 81 mg by mouth daily.     [name]  calcium carbonate (TUMS - DOSED IN MG ELEMENTAL CALCIUM) 500 MG chewable tablet  Chew 1 tablet by mouth daily.     .  cinacalcet (SENSIPAR) 30 MG tablet  Take 30 mg by mouth daily.     [name]  docusate sodium (COLACE) 100 MG capsule  Take 100 mg by mouth daily.     .  DULoxetine (CYMBALTA) 60 MG capsule  Take 60 mg by mouth at bedtime.     .  gabapentin (NEURONTIN) 300 MG capsule  Take 300 mg by mouth at bedtime.     .  insulin detemir  (LEVEMIR) 100 UNIT/ML injection  Inject 2-3 Units into the skin at bedtime as needed (high blood sugar). Uses only if blood sugar levels are over 150     .  levothyroxine (SYNTHROID, LEVOTHROID) 150 MCG tablet  Take 150 mcg by mouth daily before breakfast.     .  LORazepam (ATIVAN) 0.5 MG tablet  Take 0.5 mg by mouth at bedtime.     .  metoprolol tartrate (LOPRESSOR) 25 MG tablet  Take 1 tablet (25 mg total) by mouth 2 (two) times daily.  60 tablet  1   .  Multiple Vitamin (MULTIVITAMIN WITH MINERALS) TABS tablet  Take 1 tablet by mouth daily.     .  nicotine (NICODERM CQ - DOSED IN MG/24 HR) 7 mg/24hr patch  Place 7 mg onto the skin daily.     .  nitroGLYCERIN (NITROSTAT) 0.4 MG SL tablet  Place 0.4 mg under the tongue every 5 (five) minutes as needed for chest pain.     [name]  omeprazole (PRILOSEC) 20 MG capsule  Take 20 mg by mouth daily.     .  promethazine (PHENERGAN) 12.5 MG tablet  Take 12.5 mg by mouth every 4 (four) hours as needed for nausea.     [name]  senna (SENOKOT) 8.6 MG TABS tablet  Take 1 tablet by mouth every evening.     .  tacrolimus (PROGRAF) 1 MG capsule  Take 5 mg by mouth 2 (two) times daily.     .  traMADol (ULTRAM) 50 MG tablet  Take 50 mg by mouth 2 (two) times daily as needed (pain).      Family History   Problem  Relation  Age of Onset   .  Cancer  Mother    .  Diabetes  Mother    .  Hypertension  Mother    .  Stroke  Mother    .  Cancer  Father    .  Anesthesia problems  Neg Hx    .  Hypotension  Neg Hx    .  Malignant hyperthermia  Neg Hx        Hospital Course:   The patient was admitted for further evaluation and treatment to include dobutamine echocardiogram and TEE. She was admitted by the cardiology service and also nephrology was consulted to help manage her dialysis. The dobutamine echo suggested severe aortic stenosis with an ejection fraction of 45% an initial mean gradient of 26. With 10 mcg of dobutamine the mean gradient increased to 30 and with 15 mcg  of dobutamine further increased to 47 mm of mercury. She was then felt to require cardiac catheterization which was done on 03/10/2013 by Dr. West Bali and the following impression was noted:  IMPRESSION:  Normal LV function with an ejection fraction of approximately 50% with evidence for left ventricular hypertrophy.  Mild coronary obstructive disease with 50%  narrowing at the ostium of the first diagonal branch of the LAD with 10-20% luminal irregularities of the proximal to mid LAD and otherwise normal left circumflex and right coronary artery.  Moderately severe aortic valve stenosis with a peak to peak gradient ranging from 24-29 mmHg, and a mean gradient of 24 mm Hg. Aortic valve area calculates to 0.8-1.2 depending upon cardiac output determination consistent with moderately severe aortic valve stenosis.  RECOMMENDATION:  Angiographic findings will be reviewed the patient's primary cardiologist. In light of the patient's significant symptomatic status consideration for valve replacement surgery will be necessary.    due to these findings cardiothoracic surgical consultation was obtained with Ivin Poot MD who evaluated the patient and studies and agree with recommendations to proceed with aortic valve replacement as well as single vessel coronary artery bypass grafting. The patient was continued on a course of medical stabilization and on 03/15/2013 was felt to be stable to proceed with the surgery. The following procedure was performed:  DATE OF PROCEDURE: 03/15/2013  DATE OF DISCHARGE:  OPERATIVE REPORT  OPERATION:  1. Aortic valve replacement using a 21-mm mechanical [employer] supra-  annular (top hat) valve.  2. CABG x1 with left IMA to LAD.  SURGEON: Ivin Poot, M.D.  ASSISTANT: Suzzanne Cloud, PA-C.  ANESTHESIA: General by Dr. Albertha Ghee.  PREOPERATIVE DIAGNOSIS: Severe aortic stenosis with class IV congestive  heart failure, single-vessel coronary artery disease.    POSTOPERATIVE DIAGNOSIS: Severe aortic stenosis with class IV  congestive heart failure, single-vessel coronary artery disease.  The patient tolerated the procedure well and was taken to the surgical intensive care unit in stable condition.    Postoperative hospital course:  Overall the patient has progressed nicely. She has maintained stable hemodynamics and initially requiring inotropic support but this was weaned over time without difficulty. She was extubated without difficulty and has remained neurologically stable. She has had a moderate volume overload and nephrology has assisted with this via hemodialysis. She has had no significant postoperative cardiac dysrhythmias. She does have an expected acute blood loss anemia with chronic anemia requiring transfusion and this has stabilized. She has had some postoperative atelectasis which is improved over time with routine measures. Clinically she has been slow in regard to her rehabilitation but progress has been steady. She will require skilled nursing facility at time of discharge for further rehabilitation. Coumadin has been started and doses have been adjusted. Oxygen has been weaned and she maintains adequate saturations on room air. Incisions are noted to be healing well without evidence of infection. She is tolerating diet. Currently her status is felt to be tentatively stable for discharge to a skilled nursing facility when a bed becomes available.      Recent Labs  03/27/13 0620 03/28/13 0800  NA 136* 130*  K 3.6* 3.5*  CL 96 90*  CO2 28 25  GLUCOSE 164* 192*  BUN 20 30*  CALCIUM 9.7 10.2    Recent Labs  03/27/13 0620 03/28/13 0800  WBC 11.3* 12.5*  HGB 10.1* 10.8*  HCT 30.9* 32.7*  PLT 177 190    Recent Labs  03/26/13 1155 03/27/13 0620  INR 2.28* 2.95*     Discharge Instructions:  The patient is discharged to home with extensive instructions on wound care and progressive ambulation.  They are instructed not  to drive or perform any heavy lifting until returning to see the physician in his office.     Discharge Diagnosis:  CHEST PAIN Chest pain SEVERE  AS CAD Acute blood loss anemia Secondary Diagnosis: Patient Active Problem List   Diagnosis Date Noted  . S/P AVR 03/15/2013  . Aortic stenosis 03/09/2013  . Palpitations- run of tachycardia after HD, lasted 15-20 minutes 03/08/2013  . Tobacco abuse 02/28/2013  . COPD (chronic obstructive pulmonary disease) 02/21/2013  . Hypoxia 02/21/2013  . Chronic combined systolic and diastolic congestive heart failure 02/06/2013  . Chest pain- with tachycardia 02/05/2013  . Acute respiratory failure 11/02/2012  . Acute pulmonary edema 11/02/2012  . Acute on chronic systolic CHF (congestive heart failure) 11/02/2012  . HCAP (healthcare-associated pneumonia) 08/31/2012  . COPD exacerbation 08/31/2012  . Dyslipidemia-LDL 104, not on statin with Hx of liver transplant 07/30/2012  . Abn EKG- related to LVH, repol changes 07/30/2012  . CAD- moderate with 50% D1, 20% LAD Jan. 2015 07/27/2012  . Aortic stenosis, moderate-severe at cath Jan 2015.  07/27/2012  . End stage renal disease on dialysis-TTS 02/27/2011  . HEPATITIS C- s/p liver transplant 2011 06/25/2006  . HYPOTHYROIDISM 06/25/2006  . DIABETES MELLITUS, TYPE II 06/25/2006  . ANEMIA, CHRONIC 06/25/2006  . HTN- LVH, EF45- 50%. grade 2 diastolic dysf. echo 07/28/12 06/25/2006  . GERD 06/25/2006   Past Medical History  Diagnosis Date  . 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 Chronic back pain   . CAD (coronary artery disease)   . Obesity   . Peripheral vascular disease hands and legs  . Anxiety   . Asthma   . GERD (gastroesophageal reflux disease)     takes Omeprazole daily  . Chronic constipation     takes MIralax and Colace daily  . Anemia     takes Folic Acid daily  . Hypothyroidism     takes Synthroid  daily  . Depression     takes Cymbalta for "severe" depression  . Neuromuscular disorder     carpal tunnel in right hand  . Hypertension     takes Metoprolol and Lisinopril daily, sees Dr Shary Decamp  . COPD (chronic obstructive pulmonary disease)   . Pneumonia     "today and several times before" (08/30/2012)  . Chronic bronchitis     "q yr w/season changes" (08/30/2012)  . Type II diabetes mellitus     Levemir 2units daily if > 150  . History of blood transfusion     "several" (08/30/2012)  . Hepatitis C   . Migraine     "last migraine was in 2013" (08/30/2012)  . Headache     "at least monthly" (08/30/2012)  . Arthritis     "left hand, back" (08/30/2012)  . End stage renal disease on dialysis 02/27/2011    Kidneys shut down at time of liver transplant in Sept 2011 at [facility] in [place], she has been on HD ever since.  Dialyzes at [facility] HD on TTS schedule.  Had L forearm graft used 10 months then removed Dec 2012 due to suspected infection.  A right upper arm AVG was placed Dec 2012 but she developed steal symptoms acutely and it was ligated the same day.  Never had an AV fistula due to small veins.  Now has L thigh AVG put in Jan 2013, has not clotted to date.    [name] CAD (coronary artery disease) Jan. 2015    Cath: 20% LAD, 50% D1       Medications at discharge:   Medication  List    STOP taking these medications       nitroGLYCERIN 0.4 MG SL tablet  Commonly known as:  NITROSTAT     traMADol 50 MG tablet  Commonly known as:  ULTRAM      TAKE these medications       albuterol 90 MCG/ACT inhaler  Commonly known as:  PROVENTIL,VENTOLIN  Inhale 1-2 puffs into the lungs every 6 (six) hours as needed for wheezing or shortness of breath.     aspirin EC 81 MG tablet  Take 81 mg by mouth daily.     budesonide-formoterol 160-4.5 MCG/ACT inhaler  Commonly known as:  SYMBICORT  Inhale 2 puffs into the lungs 2 (two) times daily.     calcium carbonate 500 MG chewable tablet    Commonly known as:  TUMS - dosed in mg elemental calcium  Chew 1 tablet by mouth daily.     cinacalcet 30 MG tablet  Commonly known as:  SENSIPAR  Take 30 mg by mouth daily.     docusate sodium 100 MG capsule  Commonly known as:  COLACE  Take 100 mg by mouth daily.     DULoxetine 60 MG capsule  Commonly known as:  CYMBALTA  Take 60 mg by mouth at bedtime.     feeding supplement (NEPRO CARB STEADY) Liqd  Take 237 mLs by mouth 2 (two) times daily between meals.     ferrous [id number] C-folic acid capsule  Commonly known as:  TRINSICON / FOLTRIN  Take 1 capsule by mouth 3 (three) times daily after meals.     gabapentin 300 MG capsule  Commonly known as:  NEURONTIN  Take 300 mg by mouth at bedtime.     ibuprofen 200 MG tablet  Commonly known as:  ADVIL,MOTRIN  Take 1 tablet (200 mg total) by mouth every 6 (six) hours as needed for moderate pain.     insulin detemir 100 UNIT/ML injection  Commonly known as:  LEVEMIR  Inject 2-3 Units into the skin at bedtime as needed (high blood sugar). Uses only if blood sugar levels are over 150     levothyroxine 150 MCG tablet  Commonly known as:  SYNTHROID, LEVOTHROID  Take 150 mcg by mouth daily before breakfast.     LORazepam 0.5 MG tablet  Commonly known as:  ATIVAN  Take 0.5 mg by mouth at bedtime.     metoprolol tartrate 25 MG tablet  Commonly known as:  LOPRESSOR  Take 1 tablet (25 mg total) by mouth 2 (two) times daily.     multivitamin with minerals Tabs tablet  Take 1 tablet by mouth daily.     nicotine 7 mg/24hr patch  Commonly known as:  NICODERM CQ - dosed in mg/24 hr  Place 7 mg onto the skin daily.     omeprazole 20 MG capsule  Commonly known as:  PRILOSEC  Take 20 mg by mouth daily.     promethazine 12.5 MG tablet  Commonly known as:  PHENERGAN  Take 12.5 mg by mouth every 4 (four) hours as needed for nausea.     senna 8.6 MG Tabs tablet  Commonly known as:  SENOKOT  Take 1 tablet by mouth  every evening.     tacrolimus 1 MG capsule  Commonly known as:  PROGRAF  Take 5 mg by mouth 2 (two) times daily.     warfarin 2 MG tablet  Commonly known as:  COUMADIN  Take 1 tablet (2 mg total) by mouth  daily at 6 PM. Or as directed by Coumadin Clinic        Follow-up:  Special note: Patient is mechanical aortic valve will require lifelong anticoagulation with Coumadin. Results of PT/INR to be sent to [facility] cardiology coumadin clinic within 48 hours of discharge. Shelva Majestic is patient's cardiologist.  Follow-up Information   Follow up with VAN Wilber Oliphant, MD. (04/20/2013 at 1:30 PM to see surgeon. Please obtain a chest x-ray at [facility] 1 hour prior to the appointment. [facility] is located in the same office complex.)    Specialty:  Cardiothoracic Surgery   Contact information:   [place] [address] [phone]       Follow up with Troy Sine, MD. (Please arrange to see in 2 weeks-your cardiologist)    Specialty:  Cardiology   Contact information:   [address] [place] [address] [phone]       Please follow up. (Patient will have dialysis Monday-Wednesday-Friday at [facility])       Follow up with [facility]. (as directed)    Contact information:   [place] [address] [phone]        Disposition:  [place], [place] 03/28/2013  9:45 AM

## 2013-03-28 NOTE — Procedures (Signed)
   Assessment/Plan:  1. Severe aortic valve stenosis/CAD - aortic valve replacement & CABG x 1 per Dr. Prescott Gum 2/3.  2. AMS - sec to pain meds, MS now @ baseline; tramadol / oxycodone d/c'd, prn ibuprofen for pain.  3. ESRD - HD on TTS @ [facility], HD MWF @ [facility] after discharge to SNF, K 3.6. Next HD tomorrow.  4. HTN/Volume - BP 96/56 on Metoprolol 25 mg bid; wt 73.2 kg, below EDW.  5. Anemia - Hgb 10.1, Aranesp 60 mcg on Mon.  6. Sec HPT - Ca 9.7 (10.9 corrected); Hectorol 4 mcg, Sensipar 30 mg qd, no binders. Renal panel pre-HD, 2Ca bath.  7. DM - insulin per primary.  8. 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  Tolerating hemodialysis.  Hemodynamically stable. Some edema . BP stable. On new schedule and at new facility for OP HD. Christinamarie Tall C

## 2013-03-28 NOTE — Progress Notes (Addendum)
CARDIAC REHAB PHASE I   PRE:  Rate/Rhythm: 91 SR  BP:  Supine:131/63  Sitting:   Standing:    SaO2: would not register  MODE:  Ambulation: 550 ft   POST:  Rate/Rhythm: 122  BP:  Supine:   Sitting: 112/50  Standing:    SaO2: would not register 1342-1440 Assisted X 1 and used walker to ambulate. Gait steady with walker. Pt was able to walk 550 feet without c/o. VS stable Pt to recliner after walk with call light in reach. Completed discharge education with pt. She voices understanding. Pt agrees to [facility]. [facility] in [place], will send referral. Placed post [facility] video for pt to watch.  Rodney Langton RN 03/28/2013 2:42 PM

## 2013-03-28 NOTE — Progress Notes (Signed)
DC IV per PA verbal order.  [name], [name] Dolly

## 2013-03-28 NOTE — Progress Notes (Addendum)
SUBJECTIVE:  Feels better.  Walked today and did well.  OBJECTIVE:   Vitals:   Filed Vitals:   03/28/13 1200 03/28/13 1230 03/28/13 1235 03/28/13 1342  BP: 134/71 125/64 118/75 131/63  Pulse: 84 91 89 91  Temp:   98 F (36.7 C)   TempSrc:   Oral   Resp:   19   Height:      Weight:   159 lb 13.3 oz (72.5 kg)   SpO2:   100%    I&O's:   Intake/Output Summary (Last 24 hours) at 03/28/13 1445 Last data filed at 03/28/13 1235  Gross per 24 hour  Intake      0 ml  Output   2050 ml  Net  -2050 ml   TELEMETRY: Reviewed telemetry pt in Sinus tachycardia:     PHYSICAL EXAM General: Well developed, well nourished, in no acute distress Head: Eyes PERRLA, No xanthomas.   Normal cephalic and atramatic  Lungs:  Clear bilaterally to auscultation and percussion. Heart:   HRRR S1 crisp S2 click. No JVD.  No abdominal bruits. No femoral bruits. Abdomen: abdomen soft and non-tende Msk:  Normal strength and tone for age. Extremities:   No edema.  DP +1 Neuro: Alert and oriented X 3. Psych: normal affect, responds appropriately   LABS: Basic Metabolic Panel:  Recent Labs  03/27/13 0620 03/28/13 0800  NA 136* 130*  K 3.6* 3.5*  CL 96 90*  CO2 28 25  GLUCOSE 164* 192*  BUN 20 30*  CREATININE 5.86* 7.82*  CALCIUM 9.7 10.2  PHOS  --  2.6   Liver Function Tests:  Recent Labs  03/28/13 0800  ALBUMIN 2.6*   No results found for this basename: LIPASE, AMYLASE,  in the last 72 hours CBC:  Recent Labs  03/27/13 0620 03/28/13 0800  WBC 11.3* 12.5*  HGB 10.1* 10.8*  HCT 30.9* 32.7*  MCV 95.7 94.2  PLT 177 190   Cardiac Enzymes: No results found for this basename: CKTOTAL, CKMB, CKMBINDEX, TROPONINI,  in the last 72 hours BNP: No components found with this basename: POCBNP,  D-Dimer: No results found for this basename: DDIMER,  in the last 72 hours Hemoglobin A1C: No results found for this basename: HGBA1C,  in the last 72 hours Fasting Lipid Panel: No results found  for this basename: CHOL, HDL, LDLCALC, TRIG, CHOLHDL, LDLDIRECT,  in the last 72 hours Thyroid Function Tests: No results found for this basename: TSH, T4TOTAL, FREET3, T3FREE, THYROIDAB,  in the last 72 hours Anemia Panel: No results found for this basename: VITAMINB12, FOLATE, FERRITIN, TIBC, IRON, RETICCTPCT,  in the last 72 hours Coag Panel:   Lab Results  Component Value Date   INR 3.25* 03/28/2013   INR 2.95* 03/27/2013   INR 2.28* 03/26/2013    RADIOLOGY: Dg Orthopantogram  03/11/2013   CLINICAL DATA:  Patient for aortic valve replacement.  EXAM: ORTHOPANTOGRAM/PANORAMIC  COMPARISON:  None.  FINDINGS: The patient is missing multiple teeth. No evidence of periapical abscess is identified. No focal bony lesion is seen.  IMPRESSION: No acute finding.   Electronically Signed   By: Inge Rise M.D.   On: 03/11/2013 13:24   Dg Chest 2 View  03/26/2013   CLINICAL DATA:  Left pleural effusion.  EXAM: CHEST  2 VIEW  COMPARISON:  DG CHEST 2 VIEW dated 03/24/2013; DG CHEST 2 VIEW dated 03/23/2013  FINDINGS: Interval removal left subclavian central venous catheter. Stable cardiac and mediastinal contours. Slight interval improvement heterogeneous  opacities left lung base. Interval decrease in size of small left pleural effusion. Right lung is clear. Tiny left basilar pneumothorax entirely excluded.  IMPRESSION: Interval improvement left pleural effusion and left basilar atelectasis.  Tiny left basilar pneumothorax not entirely excluded. Attention on followup is recommended.  These results will be called to the ordering clinician or representative by the Radiologist Assistant, and communication documented in the PACS Dashboard.   Electronically Signed   By: Lovey Newcomer M.D.   On: 03/26/2013 08:27   Dg Chest 2 View  03/24/2013   CLINICAL DATA:  Post AVR, mid chest pain  EXAM: CHEST  2 VIEW  COMPARISON:  03/23/2013  FINDINGS: Moderate layering left pleural effusion, unchanged. Possible mild left  perihilar edema, improved. No pneumothorax.  Cardiomegaly.  Prosthetic aortic valve.  Stable left subclavian venous catheter, looped (possibly in the subcutaneous tissues) as it overlies the left mid clavicle, terminating in the proximal SVC.  IMPRESSION: Moderate layering left pleural effusion, unchanged.  Possible mild left perihilar edema, improved.   Electronically Signed   By: Julian Hy M.D.   On: 03/24/2013 08:14   Dg Chest 2 View  03/23/2013   CLINICAL DATA:  Status post aortic valve replacement.  EXAM: CHEST - 2 VIEW  COMPARISON:  DG CHEST 1V PORT dated 03/21/2013; DG CHEST 1V PORT dated 03/19/2013  FINDINGS: There is diminishment in edema and left pleural fluid since the prior study. Left lower lobe atelectasis/consolidation remains with component of pleural effusion. The heart size is stable. No pneumothorax is identified. Central line positioning is stable.  IMPRESSION: Decrease in pulmonary edema with residual left lower lobe atelectasis/consolidation and component of left pleural fluid.   Electronically Signed   By: Aletta Edouard M.D.   On: 03/23/2013 08:21   Dg Chest 2 View  03/14/2013   CLINICAL DATA:  Preoperative films.  EXAM: CHEST  2 VIEW  COMPARISON:  CT chest 03/11/2013 and single view of the chest 03/08/2013.  FINDINGS: Lungs are clear. Heart size is normal. No pneumothorax or pleural effusion. No focal bony abnormality.  IMPRESSION: No acute disease.   Electronically Signed   By: Inge Rise M.D.   On: 03/14/2013 22:35   Ct Chest Wo Contrast  03/11/2013   CLINICAL DATA:  52 -year-old female preoperative study for aortic valve replacement. Recent chest pain and weakness.  EXAM: CT CHEST WITHOUT CONTRAST  TECHNIQUE: Multi detector CT of the chest without contrast. Axial sagittal and coronal multiplanar reformatted images.  CONTRAST:  None.  COMPARISON:  Chest radiographs 03/08/2013 and earlier. Abdomen CT 01/29/2012 and earlier.  RADIOPHARMACEUTICALS:  None.   FLUOROSCOPY TIME:  None.  FINDINGS: Major airways are patent. Improved ventilation of both lung bases since the 2013 comparison. No pericardial or pleural effusion. No pulmonary consolidation.  No acute osseous abnormality.  Stable visualized upper abdominal viscera, numerous surgical clips about the liver compatible with prior liver transplant. No upper abdominal free fluid.  Calcified coronary artery atherosclerosis. Calcifications at the aortic valve plane. No mediastinal lymphadenopathy. Negative non contrast thoracic inlet. No axillary lymphadenopathy.  IMPRESSION: 1. No acute findings in the chest. 2. Calcified coronary artery atherosclerosis. Aortic valve calcifications. 3. Sequelae of liver transplant.   Electronically Signed   By: Lars Pinks M.D.   On: 03/11/2013 11:57   [place] Chest  03/25/2013   CLINICAL DATA:  Bilateral pleural effusions; mild shortness of breath. Request for therapeutic only thoracentesis.  EXAM: CHEST ULTRASOUND  COMPARISON:  None.  FINDINGS: Limited chest  ultrasound was performed over both left and right pleural spaces. Bilateral small effusions were noted. Left slightly larger than right.  IMPRESSION: Bilateral small pleural effusions.  Patient is on Coumadin with an INR today of 2.2. I discussed therapeutic thoracentesis with Dr. Prescott Gum. Due to the small size of the effusions it was decided to not move forward with procedure.  The patient is aware a decision and is agreeable.  If patient's symptoms change or worsen we would be glad to re-image the effusions and proceed with therapeutic thoracentesis if necessary at that time.  Read by: Jannifer Franklin [facility]   Electronically Signed   By: Markus Daft M.D.   On: 03/25/2013 14:47   Dg Chest Port 1 View  03/21/2013   CLINICAL DATA:  Follow up effusion.  Post aortic valve placement.  EXAM: PORTABLE CHEST - 1 VIEW  COMPARISON:  03/19/2013.  FINDINGS: Bilateral central line tip proximal superior vena cava level.  No gross pneumothorax.  Diffuse  asymmetric airspace disease greater on the left. This may represent pulmonary edema with pleural effusions greater on the left. Left base atelectasis may be present, infiltrate not excluded. Recommend followup until clearance.  Post valve replacement.  Cardiomegaly.  Calcified tortuous aorta.  IMPRESSION: No significant change in appearance of diffuse asymmetric airspace disease greater on the left. This may represent pulmonary edema with pleural effusions greater on the left. Left base atelectasis may be present, infiltrate not excluded.   Electronically Signed   By: Chauncey Cruel M.D.   On: 03/21/2013 07:10   Dg Chest Port 1 View  03/19/2013   CLINICAL DATA:  58 year old female aortic valve replacement. Initial encounter.  EXAM: PORTABLE CHEST - 1 VIEW  COMPARISON:  03/18/2013 and earlier.  FINDINGS: Portable AP semi upright view at 0522 hrs. Improved lung volumes. Stable cardiac size and mediastinal contours. Increased veiling opacity on the left. No significant change in mild patchy right basilar opacity. No pneumothorax. Stable bilateral subclavian catheters.  IMPRESSION: 1. Increased veiling opacity on the left compatible with pleural effusion. Associated lower lobe collapse or consolidation. 2. Mildly improved lung volumes.   Electronically Signed   By: Lars Pinks M.D.   On: 03/19/2013 07:03   Dg Chest Port 1 View  03/18/2013   CLINICAL DATA:  Aortic valve replacement.  EXAM: PORTABLE CHEST - 1 VIEW  COMPARISON:  03/17/2013  FINDINGS: Sequelae of aortic valve replacement are again identified. Bilateral chest tubes have been removed. Mediastinal drain has also been removed. Swan-Ganz catheter has been removed. Right subclavian Cordis remains in place with tip overlying the upper SVC. Left subclavian central venous catheter is again noted to be looped proximally with tip directed laterally over the SVC, unchanged.  The cardiomediastinal silhouette is unchanged. Lung volumes remain diminished with persistent  bibasilar opacities, similar to the prior study. There may be small bilateral pleural effusions, unchanged. No definite pneumothorax is identified.  IMPRESSION: 1. Interval removal of support devices as above. 2. No definite pneumothorax identified. 3. Persistent low lung volumes with bibasilar opacities, suggestive of atelectasis.   Electronically Signed   By: Logan Bores   On: 03/18/2013 07:57   Dg Chest Port 1 View  03/17/2013   CLINICAL DATA:  Status post aortic valve replacement.  EXAM: PORTABLE CHEST - 1 VIEW  COMPARISON:  DG CHEST 1V PORT dated 03/16/2013; DG CHEST 1V PORT dated 03/15/2013  FINDINGS: Right-sided Swan-Ganz catheter. Likely looped in the pulmonary outflow tract.  Bilateral chest tubes are unchanged in  position. Extubation and removal of nasogastric tube. Left subclavian line again is looped over the left clavicle with its tip at the high SVC. This may contact its lateral wall.  Patient rotated right. Cardiomegaly accentuated by AP portable technique. Probable small bilateral pleural effusions, new. No pneumothorax. Decreased lung volumes with new bibasilar airspace disease.  IMPRESSION: Extubation with diminished lung volumes and development of bibasilar atelectasis.  Probable small bilateral pleural effusions.  Similar appearance and course of the left subclavian line. Please see yesterday's report for further discussion.   Electronically Signed   By: Abigail Miyamoto M.D.   On: 03/17/2013 08:10   Dg Chest Portable 1 View In Am  03/16/2013   CLINICAL DATA:  Status post aortic valve replacement.  EXAM: PORTABLE CHEST - 1 VIEW  COMPARISON:  04/04/2013 and 03/14/2013  FINDINGS: 0548 hrs. Interval increase in lung volumes with improved aeration bilaterally. Bilateral chest tubes remain in place without evidence for pneumothorax. Left subclavian central line remains in place. There is a loop in the line formed over the left clavicle which may be external to the patient. This is stable in the interval.  The tip of this line is directed laterally and may be resting against the wall of the SVC. Right subclavian pulmonary artery catheter tip is looped in the main pulmonary artery. Endotracheal tube tip is 3.7 cm above the base of the carina. The NG tube passes into the stomach although the distal tip position is not included on the film. Cardiopericardial silhouette is upper limits of normal for size.  IMPRESSION: Interval increase lung volumes with improved aeration bilaterally. No overt edema or focal lung consolidation.  The loop in the left subclavian central line, overlying the left clavicle, may be external to the patient in the tip of this catheter is directed laterally and may rests against the wall of the superior vena cava.   Electronically Signed   By: Misty Stanley M.D.   On: 03/16/2013 07:11   Dg Chest Port 1 View  03/15/2013   CLINICAL DATA:  Central line placement.  Incorrect needle count.  EXAM: PORTABLE CHEST - 1 VIEW  COMPARISON:  03/14/2013 and 03/08/2013  FINDINGS: Endotracheal tube, Swan-Ganz catheter, chest tubes, and central line all appear in good position. No pneumothorax. Aortic valve prosthesis visible. No visible retained needle.  Interstitial markings are slightly accentuated due to the shallow inspiration. Heart size and pulmonary vascularity appear normal.  IMPRESSION: No pneumothorax. No visible retained needle. Tubes and lines appear in good position.   Electronically Signed   By: Rozetta Nunnery M.D.   On: 03/15/2013 14:52      ASSESSMENT/PLAN:    CAD: s/p CABG Aortic stenosis: s/p mechanical AVR. Coumadin now therapeutic.   Tachycardia: Stable. No recent SVT on tele.  Palpitations better.  No statin due to prior liver transplant. Will sign off.  F/u with Dr. Francee Gentile., MD  03/28/2013  2:45 PM

## 2013-03-28 NOTE — Progress Notes (Addendum)
[address]       [place] [address]             [phone]      13 Days Post-Op  Procedure(s) (LRB): AORTIC VALVE REPLACEMENT (AVR) (N/A) CORONARY ARTERY BYPASS GRAFTING (CABG) times one using left internal mammary artery. (N/A) INTRAOPERATIVE TRANSESOPHAGEAL ECHOCARDIOGRAM (N/A) Subjective: conts to feel well, some mild chest soreness  Objective  Telemetry sinus rhythm  Temp:  [97.7 F (36.5 C)-98.2 F (36.8 C)] 97.9 F (36.6 C) (02/16 0805) Pulse Rate:  [73-86] 83 (02/16 0900) Resp:  [16-20] 20 (02/16 0805) BP: (98-132)/(48-62) 111/53 mmHg (02/16 0900) SpO2:  [94 %-100 %] 100 % (02/16 0805) Weight:  [163 lb 8 oz (74.163 kg)-164 lb 7.4 oz (74.6 kg)] 164 lb 7.4 oz (74.6 kg) (02/16 0805)   Intake/Output Summary (Last 24 hours) at 03/28/13 0920 Last data filed at 03/27/13 1300  Gross per 24 hour  Intake    120 ml  Output      0 ml  Net    120 ml       General appearance: alert, cooperative and no distress Heart: regular rate and rhythm Lungs: clear anteriorly Abdomen: benign Extremities: no edema Wound: incis healing well  Lab Results:  Recent Labs  03/27/13 0620  NA 136*  K 3.6*  CL 96  CO2 28  GLUCOSE 164*  BUN 20  CREATININE 5.86*  CALCIUM 9.7   No results found for this basename: AST, ALT, ALKPHOS, BILITOT, PROT, ALBUMIN,  in the last 72 hours No results found for this basename: LIPASE, AMYLASE,  in the last 72 hours  Recent Labs  03/27/13 0620 03/28/13 0800  WBC 11.3* 12.5*  HGB 10.1* 10.8*  HCT 30.9* 32.7*  MCV 95.7 94.2  PLT 177 190   No results found for this basename: CKTOTAL, CKMB, TROPONINI,  in the last 72 hours No components found with this basename: POCBNP,  No results found for this basename: DDIMER,  in the last 72 hours No results found for this basename: HGBA1C,  in the last 72 hours No results found for this basename: CHOL, HDL, LDLCALC, TRIG, CHOLHDL,  in the last 72 hours No results found for  this basename: TSH, T4TOTAL, FREET3, T3FREE, THYROIDAB,  in the last 72 hours No results found for this basename: VITAMINB12, FOLATE, FERRITIN, TIBC, IRON, RETICCTPCT,  in the last 72 hours  Medications: Scheduled . aspirin EC  81 mg Oral Daily  . bisacodyl  10 mg Oral Daily   Or  . bisacodyl  10 mg Rectal Daily  . budesonide-formoterol  2 puff Inhalation BID  . calcium carbonate  1 tablet Oral Daily  . cinacalcet  30 mg Oral Q breakfast  . darbepoetin (ARANESP) injection - DIALYSIS  60 mcg Intravenous Q Mon-HD  . docusate sodium  200 mg Oral Daily  . doxercalciferol  4 mcg Intravenous Q T,Th,Sa-HD  . DULoxetine  60 mg Oral QHS  . feeding supplement (NEPRO CARB STEADY)  237 mL Oral BID BM  . ferrous [id number] C-folic acid  1 capsule Oral TID PC  . gabapentin  300 mg Oral QHS  . insulin aspart  0-9 Units Subcutaneous TID WC  . levothyroxine  150 mcg Oral QAC breakfast  . metoprolol tartrate  25 mg Oral BID  . multivitamin with minerals  1 tablet Oral Daily  . nicotine  7 mg Transdermal Daily  . pantoprazole  40 mg Oral Daily  .  sodium chloride  10-40 mL Intravenous Q12H  . sodium chloride  3 mL Intravenous Q12H  . tacrolimus  5 mg Oral BID  . warfarin  2 mg Oral q1800  . Warfarin - Physician Dosing Inpatient   Does not apply q1800     Radiology/Studies:  No results found.  INR: Will add last result for INR, ABG once components are confirmed Will add last 4 CBG results once components are confirmed  Assessment/Plan: S/P Procedure(s) (LRB): AORTIC VALVE REPLACEMENT (AVR) (N/A) CORONARY ARTERY BYPASS GRAFTING (CABG) times one using left internal mammary artery. (N/A) INTRAOPERATIVE TRANSESOPHAGEAL ECHOCARDIOGRAM (N/A)  1 conts to progress nicely 2 INR pending today 3 poss Tx to SNF soon       LOS: 20 days    GOLD,WAYNE E 2/16/20159:20 AM  patient examined and medical record reviewed,agree with above note. VAN TRIGT III,PETER 03/28/2013

## 2013-03-28 NOTE — Progress Notes (Signed)
Called pt's daughter to notify that pt will possibly DC to SNF tomorrow.  BARNETT, Marsh Dolly

## 2013-03-28 NOTE — Progress Notes (Signed)
Patient is ready for discharge to SNF; discharge summary needs to be completed; SNF will not take pt today because discharge is not completed; their pharmacy was closing at 2pm due to inclement weather; pt will DC tomorrow afternoon.  BARNETT, Marsh Dolly

## 2013-03-28 NOTE — Discharge Instructions (Signed)
Warfarin: What You Need to Know Warfarin is an anticoagulant. Anticoagulants help prevent the formation of blood clots. They also help stop the growth of blood clots. Warfarin is sometimes referred to as a "blood thinner."  Normally, when body tissues are cut or damaged, the blood clots in order to prevent blood loss. Sometimes clots form inside your blood vessels and obstruct the flow of blood through your circulatory system (thrombosis). These clots may travel through your bloodstream and become lodged in smaller blood vessels in your brain, which can cause a stroke, or your lungs (pulmonary embolism). WHO SHOULD USE WARFARIN? Warfarin is prescribed for people at risk of developing harmful blood clots:  People with surgically implanted mechanical heart valves, irregular heart rhythms called atrial fibrillation, and certain clotting disorders.  People who have developed harmful blood clotting in the past, including those who have had a stroke or a pulmonary embolism, or thrombosis in their legs (deep vein thrombosis [DVT]).  People with an existing blood clot such as a pulmonary embolism. WARFARIN DOSING Warfarin tablets come in different strengths. Each tablet strength is a different color, with the amount of warfarin (in milligrams) clearly printed on the tablet. If the color of your tablet is different than usual when you receive a new prescription, report it immediately to your pharmacist or health care provider. WARFARIN MONITORING The goal of warfarin therapy is to lessen the clotting tendency of blood but not to prevent clotting completely. Your health care provider will monitor the anticoagulation effect of warfarin closely and adjust your dose as needed. For your safety, blood tests called prothrombin time (PT) or international normalized ratio (INR) are used to measure the effects of warfarin. Both of these tests can be done with a finger stick or a blood draw. The longer it takes the blood  to clot, the higher the PT or INR. Your health care provider will inform you of your "target" PT or INR range. If, at any time, your PT or INR is above the target range, there is a risk of bleeding. If your PT or INR is below the target range, there is a risk of clotting. Whether you are started on warfarin while you are in the hospital, or in your health care provider's office, you will need to have your PT or INR checked within one week of starting the medicine. Initially, some people are asked to have their PT or INR checked as much as twice a week. Once you are on a stable maintenance dose, the PT or INR is checked less often, usually once every 2 to 4 weeks. The warfarin dose may be adjusted if the PT or INR is not within the target range. It is important to keep all laboratory and health care provider follow-up appointments.  WHAT ARE THE SIDE EFFECTS OF WARFARIN?  Too much warfarin can cause bleeding (hemorrhage) from any part of the body. This may include bleeding from the gums, blood in the urine, bloody or dark stools, a nosebleed that is not easily stopped, coughing up blood, or vomiting blood.  Too little warfarin can increase the risk of blood clots.  Too little or too much warfarin can also increase the risk of a stroke.  Warfarin use may cause a skin rash or irritation, an unusual fever, continual nausea or stomach upset, or severe pain in your joints or back. SPECIAL PRECAUTIONS WHILE TAKING WARFARIN Warfarin should be taken exactly as directed:  Take your medicine at the same time every day.  If you forget to take your dose, you can take it if it is within 6 hours of when it was due.  Do not change the dose of warfarin on your own to make up for missed or extra doses.  If you miss more than 2 doses in a row, you should contact your health care provider for advice. Avoid situations that cause bleeding. You may have a tendency to bleed more easily than usual while taking warfarin.  The following actions can limit bleeding:  Using a softer toothbrush.  Flossing with waxed floss rather than unwaxed floss.  Shaving with an Copy rather than a blade.  Limiting the use of sharp objects.  Avoiding potentially harmful activities such as contact sports. Warfarin and Pregnancy or Breastfeeding  Warfarin is not advised during the first trimester of pregnancy due to an increased risk of birth defects. In certain situations, a woman may take warfarin after her first trimester of pregnancy. A woman who becomes pregnant or plans to become pregnant while taking warfarin should notify her health care provider immediately.  Although warfarin does not pass into breast milk, a woman who wishes to breastfeed while taking warfarin should also consult with her health care provider. Alcohol, Smoking, and Illicit Drug Use  Alcohol affects how warfarin works in the body. It is best to avoid alcoholic drinks or consume very small amounts while taking warfarin. In general, alcohol intake should be limited to 1 oz (30 mL) of liquor, 6 oz (180 mL) of wine, or 12 oz (360 mL) of beer each day. Notify your health care provider if you change your alcohol intake.  Smoking affects how warfarin works. It is best to avoid smoking while taking warfarin. Notify your health care provider if you change your smoking habits.  It is best to avoid all illicit drugs while taking warfarin since there are few studies that show how warfarin interacts with these drugs. Other Medicines and Dietary Supplements Many prescription and over-the-counter medicines can interfere with warfarin. Be sure all of your health care providers know you are taking warfarin. Notify your health care provider who prescribed warfarin for you before starting or stopping any new medicines, including over-the-counter vitamins, dietary supplements, and pain medicines. Your warfarin dose may need to be adjusted. Some common  over-the-counter medicines that may increase the risk of bleeding while taking warfarin include:   Acetaminophen.  Aspirin.  Nonsteroidal anti-inflammatory medicines such as ibuprofen or naproxen.  Vitamin E. Dietary Considerations  Foods that have moderate or high amounts of vitamin K can interfere with warfarin. Avoid major changes in your diet or notify your health care provider before changing your diet. Eat a consistent amount of foods that have moderate or high amounts of vitamin K.Eating less foods containing vitamin K can increase the risk of bleeding. Eating more foods containing vitamin K can increase the risk of blood clots. Additional questions about dietary considerations can be discussed with a dietitian. The serving size for foods containing moderate or high amounts of vitamin K are  cup cooked (120 mL or noted gram weight) or 1 cup raw (240 mL or noted gram weight), unless otherwise noted. These foods include: Proteins  Beef liver, 3.5 oz (100 g).  Pork liver, 3.5 oz (100 g). Legumes  Soybean oil.  Soybeans.  Garbanzo beans.  Green peas.  Black-eyed peas. Leafy green vegetables  Kale.  Spinach.  Nettle greens.  Swiss chard.  Watercress.  Endive.  Parsley, 1 tbsp (4 g).  Turnip greens.  Collard greens.  Seaweed, limit 2 sheets.  Beet greens.  Dandelion greens.  Mustard greens.  Green Lead and Romaine lettuce. Cruciferous vegetables  Broccoli.  Cabbage (green or [place]).  Brussels sprouts.  Cauliflower.  Asparagus. Miscellaneous  Onions, green onions, or spring onions.  Green tea made with  oz (14 g) or more of dried tea.  Herbal teas containing coumarin.  Spinach noodles.  Okra.  Prunes.  Angie Fava. CALL YOUR CLINIC OR HEALTH CARE PROVIDER IF YOU:  Plan to have any surgery or procedure.  Feel sick, especially if you have diarrhea or vomiting.  Experience or anticipate any major changes in your diet.  Start or  stop a prescription or over-the-counter medicine.  Become, plan to become, or think you may be pregnant.  Are having heavier than usual menstrual periods.  Have had a fall, accident, or any symptoms of bleeding or unusual bruising.  An unusual fever. CALL 911 IN THE [place] OR GO TO THE EMERGENCY DEPARTMENT IF YOU:   Think you may be having an allergic reaction to warfarin. The signs of an allergic reaction could include itching, rash, hives, swelling, chest tightness, or trouble breathing.  See signs of blood in your urine. The signs could include reddish, pinkish, or tea-colored urine.  See signs of blood in your stools. The signs could include bright red or black stools.  Vomit or cough up blood. In these instances, the blood could have either a bright red or a "coffee-grounds" appearance.  Have bleeding that will not stop after applying pressure for 30 minutes such as cuts, nosebleeds, other injuries.  Have severe pain in your joints or back.  Have a new and severe headache.  Have sudden weakness or numbness of your face, arm, or leg, especially on one side of your body.  Have sudden confusion or trouble understanding.  Have sudden trouble seeing in one or both eyes.  Have sudden trouble walking, dizziness, loss of balance, or coordination.  Have aphasia. Document Released: 01/27/2005 Document Revised: 10/22/2011 Document Reviewed: 07/23/2012 [facility] Patient Information 2014 [place]. Coronary Artery Bypass Grafting, Care After Refer to this sheet in the next few weeks. These instructions provide you with information on caring for yourself after your procedure. Your health care provider may also give you more specific instructions. Your treatment has been planned according to current medical practices, but problems sometimes occur. Call your health care provider if you have any problems or questions after your procedure. WHAT TO EXPECT AFTER THE PROCEDURE Recovery from  surgery will be different for everyone. Some people feel well after 3 or 4 weeks, while for others it takes longer. After your procedure, it is typical to have the following:  Nausea and a lack of appetite.   Constipation.  Weakness and fatigue.   Depression or irritability.   Pain or discomfort at your incision site. HOME CARE INSTRUCTIONS  Only take over-the-counter or prescription medicines as directed by your health care provider. Take all medicines exactly as directed. Do not stop taking medicines or start any new medicines without first checking with your health care provider.   Take your pulse as directed by your health care provider.  Perform deep breathing as directed by your health care provider. If you were given a device called an incentive spirometer, use it to practice deep breathing several times a day. Support your chest with a pillow or your arms when you take deep breaths or cough.  Keep incision areas clean, dry,  and protected. Remove or change any bandages (dressings) only as directed by your health care provider. You may have skin adhesive strips over the incision areas. Do not take the strips off. They will fall off on their own.  Check incision areas daily for any swelling, redness, or drainage.  If incisions were made in your legs, do the following:  Avoid crossing your legs.   Avoid sitting for long periods of time. Change positions every 30 minutes.   Elevate your legs when you are sitting.   Wear compression stockings as directed by your health care provider. These stockings help keep blood clots from forming in your legs.  Take showers once your health care provider approves. Until then, only take sponge baths. Pat incisions dry. Do not rub incisions with a washcloth or towel. Do not take tub baths or go swimming until your health care provider approves.  Eat foods that are high in fiber, such as raw fruits and vegetables, whole grains, beans, and  nuts. Meats should be lean cut. Avoid canned, processed, and fried foods.  Drink enough fluids to keep your urine clear or pale yellow.  Weigh yourself every day. This helps identify if you are retaining fluid that may make your heart and lungs work harder.   Rest and limit activity as directed by your health care provider. You may be instructed to:  Stop any activity at once if you have chest pain, shortness of breath, irregular heartbeats, or dizziness. Get help right away if you have any of these symptoms.  Move around frequently for short periods or take short walks as directed by your health care provider. Increase your activities gradually. You may need physical therapy or cardiac rehabilitation to help strengthen your muscles and build your endurance.  Avoid lifting, pushing, or pulling anything heavier than 10 lb (4.5 kg) for at least 6 weeks after surgery.  Do not drive until your health care provider approves.  Ask your health care provider when you may return to work and resume sexual activity.  Follow up with your health care provider as directed.  SEEK MEDICAL CARE IF:  You have swelling, redness, increasing pain, or drainage at the site of an incision.   You develop a fever.   You have swelling in your ankles or legs.   You have pain in your legs.   You have weight gain of 2 or more pounds a day.  You are nauseous or vomit.  You have diarrhea. SEEK IMMEDIATE MEDICAL CARE IF:  You have chest pain that goes to your jaw or arms.  You have shortness of breath.   You have a fast or irregular heartbeat.   You notice a "clicking" in your breastbone (sternum) when you move.   You have numbness or weakness in your arms or legs.  You feel dizzy or lightheaded.  MAKE SURE YOU:  Understand these instructions.  Will watch your condition.  Will get help right away if you are not doing well or get worse. Document Released: 08/16/2004 Document Revised:  09/29/2012 Document Reviewed: 07/06/2012 [facility] Patient Information 2014 [place]. Aortic Valve Replacement Care After Refer to this sheet in the next few weeks. These instructions provide you with information on caring for yourself after your procedure. Your caregiver may also give you specific instructions. Your treatment has been planned according to current medical practices, but problems sometimes occur. Call your caregiver if you have any problems or questions after your procedure. HOME CARE INSTRUCTIONS  Only take over-the-counter or prescription medicines as directed by your caregiver.  Take your temperature every morning for the first 7 days after surgery. Write these down. Call your caregiver if your temperature stays above 100 F (37.8 C) for more than a day.   Weigh yourself every morning for at least 7 days after surgery. Write your weight down to monitor any weight increase.  Wear elastic stockings during the day for at least 2 weeks after surgery. Use them longer if your ankles are swollen. The stockings help blood flow and help reduce swelling in the legs.  Take frequent naps or rest often throughout the day.  Avoid lifting over 10 lbs (4.5 kg) or pushing or pulling things with your arms for 6 8 weeks or as directed by your caregiver.  Avoid driving or airplane travel for 4 6 weeks after surgery or as directed. If you are riding in a car for an extended period, stop every 1 2 hours to stretch your legs. Keep a record of your medicines and medical history with you when traveling.  Do not cross your legs.  Do not take baths for 4 6 weeks after surgery. Take showers once your caregiver approves. Pat incisions dry. Do not rub incisions with a washcloth or towel.  Avoid climbing stairs and using the handrail to pull yourself up for the first 2 3 weeks after surgery.  Return to work as directed by your caregiver.  Drink enough fluids to keep your urine clear or  pale yellow.  Do not strain to have a bowel movement. Eat high-fiber foods if you become constipated. You may also take a medicine to help you have a bowel movement (laxative) as directed by your caregiver.  Resume sexual activity as directed by your caregiver. Men should not use medicines for erectile dysfunction until their doctor says it isokay.  If you had a certain type of heart condition in the past, you may need to take antibiotic medicine before having dental work or surgery. Let your dentist and caregivers know if you had one or more of the following:  Previous endocarditis.  An artificial (prosthetic) heart valve.  Congenital heart disease. SEEK MEDICAL CARE IF:  You develop a skin rash.   Your weight is increasing each day over 2 3 days, or you have a sudden weight gain.  Your weight increases by 2 or more pounds (1 kg or more) in a single day. SEEK IMMEDIATE MEDICAL CARE IF:   You develop chest pain that is not coming from your incision.   You develop shortness of breath or have difficulty breathing.   You have a fever.   You have increased bleeding from your wounds.   You have increasing wound pain.   You have redness or swelling around your wounds  You have pus coming from your wound.   You develop lightheadedness.  MAKE SURE YOU:   Understand these directions.  Will watch your condition.  Will get help right away if you are not doing well or get worse. Document Released: 08/15/2004 Document Revised: 01/14/2012 Document Reviewed: 11/11/2011 [facility] Patient Information 2014 [place], [place].

## 2013-03-29 DIAGNOSIS — I4729 Other ventricular tachycardia: Secondary | ICD-10-CM | POA: Diagnosis not present

## 2013-03-29 DIAGNOSIS — F411 Generalized anxiety disorder: Secondary | ICD-10-CM | POA: Diagnosis present

## 2013-03-29 DIAGNOSIS — E039 Hypothyroidism, unspecified: Secondary | ICD-10-CM | POA: Diagnosis present

## 2013-03-29 DIAGNOSIS — E119 Type 2 diabetes mellitus without complications: Secondary | ICD-10-CM | POA: Diagnosis present

## 2013-03-29 DIAGNOSIS — I498 Other specified cardiac arrhythmias: Secondary | ICD-10-CM | POA: Diagnosis present

## 2013-03-29 DIAGNOSIS — I739 Peripheral vascular disease, unspecified: Secondary | ICD-10-CM | POA: Diagnosis not present

## 2013-03-29 DIAGNOSIS — I120 Hypertensive chronic kidney disease with stage 5 chronic kidney disease or end stage renal disease: Secondary | ICD-10-CM | POA: Diagnosis present

## 2013-03-29 DIAGNOSIS — Z66 Do not resuscitate: Secondary | ICD-10-CM | POA: Diagnosis present

## 2013-03-29 DIAGNOSIS — F329 Major depressive disorder, single episode, unspecified: Secondary | ICD-10-CM | POA: Diagnosis present

## 2013-03-29 DIAGNOSIS — Z951 Presence of aortocoronary bypass graft: Secondary | ICD-10-CM | POA: Diagnosis not present

## 2013-03-29 DIAGNOSIS — I471 Supraventricular tachycardia, unspecified: Secondary | ICD-10-CM | POA: Diagnosis not present

## 2013-03-29 DIAGNOSIS — I472 Ventricular tachycardia: Secondary | ICD-10-CM | POA: Diagnosis not present

## 2013-03-29 DIAGNOSIS — Z794 Long term (current) use of insulin: Secondary | ICD-10-CM | POA: Diagnosis not present

## 2013-03-29 DIAGNOSIS — D649 Anemia, unspecified: Secondary | ICD-10-CM | POA: Diagnosis not present

## 2013-03-29 DIAGNOSIS — E785 Hyperlipidemia, unspecified: Secondary | ICD-10-CM | POA: Diagnosis present

## 2013-03-29 DIAGNOSIS — J45909 Unspecified asthma, uncomplicated: Secondary | ICD-10-CM | POA: Diagnosis not present

## 2013-03-29 DIAGNOSIS — D509 Iron deficiency anemia, unspecified: Secondary | ICD-10-CM | POA: Diagnosis not present

## 2013-03-29 DIAGNOSIS — R079 Chest pain, unspecified: Secondary | ICD-10-CM | POA: Diagnosis not present

## 2013-03-29 DIAGNOSIS — F17200 Nicotine dependence, unspecified, uncomplicated: Secondary | ICD-10-CM | POA: Diagnosis present

## 2013-03-29 DIAGNOSIS — Z5189 Encounter for other specified aftercare: Secondary | ICD-10-CM | POA: Diagnosis not present

## 2013-03-29 DIAGNOSIS — K5900 Constipation, unspecified: Secondary | ICD-10-CM | POA: Diagnosis present

## 2013-03-29 DIAGNOSIS — I2510 Atherosclerotic heart disease of native coronary artery without angina pectoris: Secondary | ICD-10-CM | POA: Diagnosis not present

## 2013-03-29 DIAGNOSIS — K219 Gastro-esophageal reflux disease without esophagitis: Secondary | ICD-10-CM | POA: Diagnosis present

## 2013-03-29 DIAGNOSIS — E669 Obesity, unspecified: Secondary | ICD-10-CM | POA: Diagnosis not present

## 2013-03-29 DIAGNOSIS — Z7982 Long term (current) use of aspirin: Secondary | ICD-10-CM | POA: Diagnosis not present

## 2013-03-29 DIAGNOSIS — Z954 Presence of other heart-valve replacement: Secondary | ICD-10-CM | POA: Diagnosis not present

## 2013-03-29 DIAGNOSIS — Z992 Dependence on renal dialysis: Secondary | ICD-10-CM | POA: Diagnosis not present

## 2013-03-29 DIAGNOSIS — I519 Heart disease, unspecified: Secondary | ICD-10-CM | POA: Diagnosis not present

## 2013-03-29 DIAGNOSIS — I10 Essential (primary) hypertension: Secondary | ICD-10-CM | POA: Diagnosis not present

## 2013-03-29 DIAGNOSIS — N2581 Secondary hyperparathyroidism of renal origin: Secondary | ICD-10-CM | POA: Diagnosis present

## 2013-03-29 DIAGNOSIS — M549 Dorsalgia, unspecified: Secondary | ICD-10-CM | POA: Diagnosis not present

## 2013-03-29 DIAGNOSIS — Z7901 Long term (current) use of anticoagulants: Secondary | ICD-10-CM | POA: Diagnosis not present

## 2013-03-29 DIAGNOSIS — Z79899 Other long term (current) drug therapy: Secondary | ICD-10-CM | POA: Diagnosis not present

## 2013-03-29 DIAGNOSIS — Z944 Liver transplant status: Secondary | ICD-10-CM | POA: Diagnosis not present

## 2013-03-29 DIAGNOSIS — F3289 Other specified depressive episodes: Secondary | ICD-10-CM | POA: Diagnosis present

## 2013-03-29 DIAGNOSIS — Z9889 Other specified postprocedural states: Secondary | ICD-10-CM | POA: Diagnosis not present

## 2013-03-29 DIAGNOSIS — D631 Anemia in chronic kidney disease: Secondary | ICD-10-CM | POA: Diagnosis present

## 2013-03-29 DIAGNOSIS — J449 Chronic obstructive pulmonary disease, unspecified: Secondary | ICD-10-CM | POA: Diagnosis present

## 2013-03-29 DIAGNOSIS — N186 End stage renal disease: Secondary | ICD-10-CM | POA: Diagnosis not present

## 2013-03-29 DIAGNOSIS — I359 Nonrheumatic aortic valve disorder, unspecified: Secondary | ICD-10-CM | POA: Diagnosis not present

## 2013-03-29 DIAGNOSIS — R0602 Shortness of breath: Secondary | ICD-10-CM | POA: Diagnosis not present

## 2013-03-29 LAB — GLUCOSE, CAPILLARY
Glucose-Capillary: 157 mg/dL — ABNORMAL HIGH (ref 70–99)
Glucose-Capillary: 179 mg/dL — ABNORMAL HIGH (ref 70–99)

## 2013-03-29 LAB — PROTIME-INR
INR: 2.62 — ABNORMAL HIGH (ref 0.00–1.49)
Prothrombin Time: 27.1 seconds — ABNORMAL HIGH (ref 11.6–15.2)

## 2013-03-29 MED ORDER — PENTAFLUOROPROP-TETRAFLUOROETH EX AERO: 1.0000 "application " | INHALATION_SPRAY | CUTANEOUS | Status: DC | PRN

## 2013-03-29 MED ORDER — NEPRO/CARBSTEADY PO LIQD: 237.0000 mL | Freq: Two times a day (BID) | ORAL | Status: DC

## 2013-03-29 MED ORDER — SODIUM CHLORIDE 0.9 % IV SOLN: 100.0000 mL | INTRAVENOUS | Status: DC | PRN

## 2013-03-29 MED ORDER — LIDOCAINE HCL (PF) 1 % IJ SOLN: 5.0000 mL | INTRAMUSCULAR | Status: DC | PRN

## 2013-03-29 MED ORDER — FE FUMARATE-B12-VIT C-FA-IFC PO CAPS
1.0000 | ORAL_CAPSULE | Freq: Three times a day (TID) | ORAL | Status: DC
Start: 2013-03-29 — End: 2013-04-20

## 2013-03-29 MED ORDER — HEPARIN SODIUM (PORCINE) 1000 UNIT/ML DIALYSIS
20.0000 [IU]/kg | INTRAMUSCULAR | Status: DC | PRN
  Filled 2013-03-29: qty 2

## 2013-03-29 MED ORDER — IBUPROFEN 200 MG PO TABS: 200.0000 mg | ORAL_TABLET | Freq: Four times a day (QID) | ORAL | Status: DC | PRN

## 2013-03-29 MED ORDER — HEPARIN SODIUM (PORCINE) 1000 UNIT/ML DIALYSIS
1000.0000 [IU] | INTRAMUSCULAR | Status: DC | PRN
  Filled 2013-03-29: qty 1

## 2013-03-29 MED ORDER — WARFARIN SODIUM 2 MG PO TABS: 2.0000 mg | ORAL_TABLET | Freq: Every day | ORAL | Status: DC

## 2013-03-29 MED ORDER — LIDOCAINE-PRILOCAINE 2.5-2.5 % EX CREA
1.0000 "application " | TOPICAL_CREAM | CUTANEOUS | Status: DC | PRN
  Filled 2013-03-29: qty 5

## 2013-03-29 MED ORDER — NEPRO/CARBSTEADY PO LIQD
237.0000 mL | ORAL | Status: DC | PRN
  Filled 2013-03-29: qty 237

## 2013-03-29 MED ORDER — BUDESONIDE-FORMOTEROL FUMARATE 160-4.5 MCG/ACT IN AERO
2.0000 | INHALATION_SPRAY | Freq: Two times a day (BID) | RESPIRATORY_TRACT | Status: DC
Start: 2013-03-29 — End: 2013-06-01

## 2013-03-29 MED ORDER — ALTEPLASE 2 MG IJ SOLR
2.0000 mg | Freq: Once | INTRAMUSCULAR | Status: DC | PRN
  Filled 2013-03-29: qty 2

## 2013-03-29 NOTE — Progress Notes (Signed)
Physical Therapy Treatment Patient Details Name: [name] MRN: [id number] DOB: 09/23/1955 Today's Date: 03/29/2013 Time: 7371-0626 PT Time Calculation (min): 16 min  PT Assessment / Plan / Recommendation  History of Present Illness pt admitted with CAD and SOB, s/p AVR and CABG x1   PT Comments   Pt clearly improving daily. Likely will not need equipment or follow up at time of D/C home.  Follow Up Recommendations  Other (comment);SNF (short term)     Does the patient have the potential to tolerate intense rehabilitation     Barriers to Discharge        Equipment Recommendations  None recommended by PT    Recommendations for Other Services    Frequency     Progress towards PT Goals Progress towards PT goals: Progressing toward goals  Plan Current plan remains appropriate    Precautions / Restrictions Precautions Precautions: Fall;Sternal   Pertinent Vitals/Pain 96% on RA during gait,   EHR in the upper 80's    Mobility  Bed Mobility Overal bed mobility: Independent Bed Mobility: Supine to Sit Supine to sit: Supervision General bed mobility comments: Eventhough she is ultimately independent, pt still adds in use of at least one UE.  Reinforced the potential consequences of breaking the sternal precautions now. Transfers Overall transfer level: Needs assistance Transfers: Sit to/from Stand Sit to Stand: Supervision General transfer comment: pt able to stand without assist and without use of UE's.  Again, stressed importance of following sternal precautions Ambulation/Gait Ambulation/Gait assistance: Supervision Ambulation Distance (Feet): 500 Feet Assistive device: None Gait Pattern/deviations: Step-through pattern;Scissoring;Narrow base of support Gait velocity: pt struggled to increase speed toward something age appropriate. Gait velocity interpretation: Below normal speed for age/gender General Gait Details: gait starts out steady and becomes mildly unsteady  with scissoring and wandering as she is asked to do more complex tasks. Stairs: Yes Stairs assistance: Supervision Stair Management: One rail Right;Alternating pattern;Forwards Number of Stairs: 3 General stair comments: pt using rails more heavily than appropriate, but did not need assist.    Exercises     PT Diagnosis:    PT Problem List:   PT Treatment Interventions:     PT Goals (current goals can now be found in the care plan section) Acute Rehab PT Goals Patient Stated Goal: Ultimately independent PT Goal Formulation: With patient Time For Goal Achievement: 04/01/13 Potential to Achieve Goals: Good  Visit Information  Last PT Received On: 03/29/13 Assistance Needed: +1 History of Present Illness: pt admitted with CAD and SOB, s/p AVR and CABG x1    Subjective Data  Subjective: Feeling good, walking around the unit by myself at times Patient Stated Goal: Ultimately independent   Cognition  Cognition Arousal/Alertness: Awake/alert Behavior During Therapy: WFL for tasks assessed/performed Overall Cognitive Status: Within Functional Limits for tasks assessed    Balance  Balance Overall balance assessment: Needs assistance Sitting-balance support: Feet supported;No upper extremity supported Sitting balance-Leahy Scale: Good Standing balance support: No upper extremity supported Standing balance-Leahy Scale: Good Standing balance comment: lists and wanders in the direction of momentum, but self corrects High level balance activites: Backward walking;Direction changes;Turns;Sudden stops;Head turns High Level Balance Comments: No overt LOB, but mildly unsteady at time with self recovery Standardized Balance Assessment Standardized Balance Assessment : Dynamic Gait Index Dynamic Gait Index Level Surface: Mild Impairment Change in Gait Speed: Normal Gait with Horizontal Head Turns: Normal Gait with Vertical Head Turns: Normal Gait and Pivot Turn: Normal Step Over  Obstacle: Mild Impairment  Step Around Obstacles: Mild Impairment Steps: Mild Impairment Total Score: 20 General Comments General comments (skin integrity, edema, etc.): Pt will noticeable gait impairments that she can self recover from.  These are worse with fatigue and much less noticeable when not.  End of Session PT - End of Session Activity Tolerance: Patient tolerated treatment well Patient left: in bed;with call bell/phone within reach Nurse Communication: Mobility status   GP     Ziyad Dyar, Tessie Fass 03/29/2013, 10:22 AM 03/29/2013  [name], PT [phone]  (pager)

## 2013-03-29 NOTE — Progress Notes (Addendum)
Assessment/Plan:  1. Severe aortic valve stenosis/CAD - aortic valve replacement & CABG x 1 per Dr. Prescott Gum 2/3.  2. AMS - sec to pain meds, MS now @ baseline; tramadol / oxycodone d/c'd, prn ibuprofen for pain.  3. ESRD - HD on MWF as transient while at SNF in [place] 4. HTN/Volume -ok 5. Anemia - Hgb 10.1, Aranesp 60 mcg on Mon.  6. Sec HPT - ; 7. Hectorol 4 mcg, Sensipar 30 mg qd, no binders.  8. DM - insulin per primary.  9. 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  To go to [facility]. Has new schedule MWF Exam unchanged. [name] C

## 2013-03-29 NOTE — Progress Notes (Addendum)
CSW (Clinical Education officer, museum) prepared pt dc packet and placed with shadow chart. CSW to call for non-emergent ambulance transport after pt has dialysis. Pt, pt nurse, and facility informed. CSW left voicemail for pt daughter. CSW signing off.  ADDENDUM: CSW informed by pt nurse pt is not going to dialysis. CSW called transport at 1:20pm.  Berton Mount, [place]

## 2013-03-29 NOTE — Progress Notes (Signed)
DC tele per orders; called [facility] and gave RN report; PTAR was called at 1:20pm.  Charissa Bash, [place]

## 2013-03-29 NOTE — Progress Notes (Signed)
       [address]       [place] [address]             [phone]          14 Days Post-Op Procedure(s) (LRB): AORTIC VALVE REPLACEMENT (AVR) (N/A) CORONARY ARTERY BYPASS GRAFTING (CABG) times one using left internal mammary artery. (N/A) INTRAOPERATIVE TRANSESOPHAGEAL ECHOCARDIOGRAM (N/A)  Subjective: Walking in hall with RN. Feels well, no complaints.   Objective: Vital signs in last 24 hours: Patient Vitals for the past 24 hrs:  BP Temp Temp src Pulse Resp SpO2 Weight  03/29/13 0540 115/39 mmHg 98.3 F (36.8 C) Oral 86 18 100 % 159 lb 6.3 oz (72.3 kg)  03/28/13 2135 96/48 mmHg 98.6 F (37 C) Oral 88 18 99 % -  03/28/13 1342 131/63 mmHg - - 91 - - -  03/28/13 1235 118/75 mmHg 98 F (36.7 C) Oral 89 19 100 % 159 lb 13.3 oz (72.5 kg)  03/28/13 1230 125/64 mmHg - - 91 - - -  03/28/13 1200 134/71 mmHg - - 84 - - -  03/28/13 1130 124/74 mmHg - - 81 - - -  03/28/13 1100 105/71 mmHg - - 79 - - -  03/28/13 1030 122/67 mmHg - - 81 - - -  03/28/13 1000 116/61 mmHg - - 81 - - -  03/28/13 0930 112/59 mmHg - - 81 - - -  03/28/13 0900 111/53 mmHg - - 83 - - -  03/28/13 0840 118/62 mmHg - - 84 - - -  03/28/13 0835 105/59 mmHg - - 85 - - -   Current Weight  03/29/13 159 lb 6.3 oz (72.3 kg)     Intake/Output from previous day: 02/16 0701 - 02/17 0700 In: 0  Out: 2050   CBGs [phone]    PHYSICAL EXAM:  Heart: RRR Lungs: Clear Wound: Clean and dry Extremities: No significant LE edema    Lab Results: CBC: Recent Labs  03/27/13 0620 03/28/13 0800  WBC 11.3* 12.5*  HGB 10.1* 10.8*  HCT 30.9* 32.7*  PLT 177 190   BMET:  Recent Labs  03/27/13 0620 03/28/13 0800  NA 136* 130*  K 3.6* 3.5*  CL 96 90*  CO2 28 25  GLUCOSE 164* 192*  BUN 20 30*  CREATININE 5.86* 7.82*  CALCIUM 9.7 10.2    PT/INR:  Recent Labs  03/29/13 0400  LABPROT 27.1*  INR 2.62*      Assessment/Plan: S/P Procedure(s) (LRB): AORTIC VALVE REPLACEMENT  (AVR) (N/A) CORONARY ARTERY BYPASS GRAFTING (CABG) times one using left internal mammary artery. (N/A) INTRAOPERATIVE TRANSESOPHAGEAL ECHOCARDIOGRAM (N/A) Doing well from surgical standpoint. To [facility] today if bed available.    LOS: 21 days    Infinity Jeffords H 03/29/2013

## 2013-03-30 DIAGNOSIS — D631 Anemia in chronic kidney disease: Secondary | ICD-10-CM | POA: Diagnosis not present

## 2013-03-30 DIAGNOSIS — N2581 Secondary hyperparathyroidism of renal origin: Secondary | ICD-10-CM | POA: Diagnosis not present

## 2013-03-30 DIAGNOSIS — N186 End stage renal disease: Secondary | ICD-10-CM | POA: Diagnosis not present

## 2013-03-31 DIAGNOSIS — I2510 Atherosclerotic heart disease of native coronary artery without angina pectoris: Secondary | ICD-10-CM | POA: Diagnosis not present

## 2013-03-31 DIAGNOSIS — R079 Chest pain, unspecified: Secondary | ICD-10-CM | POA: Diagnosis not present

## 2013-03-31 DIAGNOSIS — D649 Anemia, unspecified: Secondary | ICD-10-CM | POA: Diagnosis not present

## 2013-03-31 DIAGNOSIS — I359 Nonrheumatic aortic valve disorder, unspecified: Secondary | ICD-10-CM | POA: Diagnosis not present

## 2013-04-01 DIAGNOSIS — N186 End stage renal disease: Secondary | ICD-10-CM | POA: Diagnosis not present

## 2013-04-01 DIAGNOSIS — D631 Anemia in chronic kidney disease: Secondary | ICD-10-CM | POA: Diagnosis not present

## 2013-04-01 DIAGNOSIS — N2581 Secondary hyperparathyroidism of renal origin: Secondary | ICD-10-CM | POA: Diagnosis not present

## 2013-04-03 DIAGNOSIS — D509 Iron deficiency anemia, unspecified: Secondary | ICD-10-CM | POA: Diagnosis not present

## 2013-04-03 DIAGNOSIS — Z944 Liver transplant status: Secondary | ICD-10-CM | POA: Diagnosis not present

## 2013-04-03 DIAGNOSIS — N186 End stage renal disease: Secondary | ICD-10-CM | POA: Diagnosis not present

## 2013-04-04 DIAGNOSIS — D631 Anemia in chronic kidney disease: Secondary | ICD-10-CM | POA: Diagnosis not present

## 2013-04-04 DIAGNOSIS — N186 End stage renal disease: Secondary | ICD-10-CM | POA: Diagnosis not present

## 2013-04-04 DIAGNOSIS — N2581 Secondary hyperparathyroidism of renal origin: Secondary | ICD-10-CM | POA: Diagnosis not present

## 2013-04-06 DIAGNOSIS — N2581 Secondary hyperparathyroidism of renal origin: Secondary | ICD-10-CM | POA: Diagnosis not present

## 2013-04-06 DIAGNOSIS — D631 Anemia in chronic kidney disease: Secondary | ICD-10-CM | POA: Diagnosis not present

## 2013-04-06 DIAGNOSIS — N186 End stage renal disease: Secondary | ICD-10-CM | POA: Diagnosis not present

## 2013-04-07 NOTE — Discharge Summary (Signed)
patient examined and medical record reviewed,agree with above note. VAN TRIGT III,Mikela Senn 04/07/2013   

## 2013-04-08 DIAGNOSIS — N186 End stage renal disease: Secondary | ICD-10-CM | POA: Diagnosis not present

## 2013-04-08 DIAGNOSIS — N039 Chronic nephritic syndrome with unspecified morphologic changes: Secondary | ICD-10-CM | POA: Diagnosis not present

## 2013-04-08 DIAGNOSIS — N2581 Secondary hyperparathyroidism of renal origin: Secondary | ICD-10-CM | POA: Diagnosis not present

## 2013-04-08 DIAGNOSIS — D631 Anemia in chronic kidney disease: Secondary | ICD-10-CM | POA: Diagnosis not present

## 2013-04-09 ENCOUNTER — Encounter (HOSPITAL_COMMUNITY): Payer: Self-pay | Admitting: Emergency Medicine

## 2013-04-09 ENCOUNTER — Inpatient Hospital Stay (HOSPITAL_COMMUNITY)
Admission: EM | Admit: 2013-04-09 | Discharge: 2013-04-15 | DRG: 308 | Disposition: A | Discharge status: Home / Self Care | Payer: Medicare Other | Attending: Cardiology | Admitting: Cardiology

## 2013-04-09 ENCOUNTER — Emergency Department (HOSPITAL_COMMUNITY): Payer: Medicare Other

## 2013-04-09 DIAGNOSIS — I359 Nonrheumatic aortic valve disorder, unspecified: Secondary | ICD-10-CM | POA: Diagnosis not present

## 2013-04-09 DIAGNOSIS — I350 Nonrheumatic aortic (valve) stenosis: Secondary | ICD-10-CM

## 2013-04-09 DIAGNOSIS — I471 Supraventricular tachycardia, unspecified: Principal | ICD-10-CM | POA: Diagnosis present

## 2013-04-09 DIAGNOSIS — E785 Hyperlipidemia, unspecified: Secondary | ICD-10-CM | POA: Diagnosis present

## 2013-04-09 DIAGNOSIS — Z944 Liver transplant status: Secondary | ICD-10-CM | POA: Diagnosis not present

## 2013-04-09 DIAGNOSIS — Z7901 Long term (current) use of anticoagulants: Secondary | ICD-10-CM | POA: Diagnosis not present

## 2013-04-09 DIAGNOSIS — K5900 Constipation, unspecified: Secondary | ICD-10-CM | POA: Diagnosis present

## 2013-04-09 DIAGNOSIS — I2510 Atherosclerotic heart disease of native coronary artery without angina pectoris: Secondary | ICD-10-CM

## 2013-04-09 DIAGNOSIS — K219 Gastro-esophageal reflux disease without esophagitis: Secondary | ICD-10-CM | POA: Diagnosis present

## 2013-04-09 DIAGNOSIS — F329 Major depressive disorder, single episode, unspecified: Secondary | ICD-10-CM | POA: Diagnosis present

## 2013-04-09 DIAGNOSIS — Z66 Do not resuscitate: Secondary | ICD-10-CM | POA: Diagnosis present

## 2013-04-09 DIAGNOSIS — F3289 Other specified depressive episodes: Secondary | ICD-10-CM | POA: Diagnosis present

## 2013-04-09 DIAGNOSIS — I10 Essential (primary) hypertension: Secondary | ICD-10-CM | POA: Diagnosis not present

## 2013-04-09 DIAGNOSIS — Z952 Presence of prosthetic heart valve: Secondary | ICD-10-CM

## 2013-04-09 DIAGNOSIS — E039 Hypothyroidism, unspecified: Secondary | ICD-10-CM | POA: Diagnosis present

## 2013-04-09 DIAGNOSIS — E119 Type 2 diabetes mellitus without complications: Secondary | ICD-10-CM | POA: Diagnosis present

## 2013-04-09 DIAGNOSIS — Z794 Long term (current) use of insulin: Secondary | ICD-10-CM

## 2013-04-09 DIAGNOSIS — Z954 Presence of other heart-valve replacement: Secondary | ICD-10-CM | POA: Diagnosis not present

## 2013-04-09 DIAGNOSIS — I120 Hypertensive chronic kidney disease with stage 5 chronic kidney disease or end stage renal disease: Secondary | ICD-10-CM | POA: Diagnosis present

## 2013-04-09 DIAGNOSIS — Z79899 Other long term (current) drug therapy: Secondary | ICD-10-CM | POA: Diagnosis not present

## 2013-04-09 DIAGNOSIS — R079 Chest pain, unspecified: Secondary | ICD-10-CM | POA: Diagnosis not present

## 2013-04-09 DIAGNOSIS — I119 Hypertensive heart disease without heart failure: Secondary | ICD-10-CM | POA: Diagnosis present

## 2013-04-09 DIAGNOSIS — N186 End stage renal disease: Secondary | ICD-10-CM | POA: Diagnosis not present

## 2013-04-09 DIAGNOSIS — N2581 Secondary hyperparathyroidism of renal origin: Secondary | ICD-10-CM | POA: Diagnosis present

## 2013-04-09 DIAGNOSIS — F411 Generalized anxiety disorder: Secondary | ICD-10-CM | POA: Diagnosis present

## 2013-04-09 DIAGNOSIS — R002 Palpitations: Secondary | ICD-10-CM | POA: Diagnosis not present

## 2013-04-09 DIAGNOSIS — I498 Other specified cardiac arrhythmias: Secondary | ICD-10-CM | POA: Diagnosis present

## 2013-04-09 DIAGNOSIS — J449 Chronic obstructive pulmonary disease, unspecified: Secondary | ICD-10-CM | POA: Diagnosis not present

## 2013-04-09 DIAGNOSIS — I472 Ventricular tachycardia: Secondary | ICD-10-CM | POA: Diagnosis not present

## 2013-04-09 DIAGNOSIS — E1129 Type 2 diabetes mellitus with other diabetic kidney complication: Secondary | ICD-10-CM | POA: Diagnosis not present

## 2013-04-09 DIAGNOSIS — E1122 Type 2 diabetes mellitus with diabetic chronic kidney disease: Secondary | ICD-10-CM | POA: Diagnosis present

## 2013-04-09 DIAGNOSIS — Z720 Tobacco use: Secondary | ICD-10-CM | POA: Diagnosis present

## 2013-04-09 DIAGNOSIS — J4489 Other specified chronic obstructive pulmonary disease: Secondary | ICD-10-CM | POA: Diagnosis present

## 2013-04-09 DIAGNOSIS — Z992 Dependence on renal dialysis: Secondary | ICD-10-CM | POA: Diagnosis not present

## 2013-04-09 DIAGNOSIS — Z951 Presence of aortocoronary bypass graft: Secondary | ICD-10-CM

## 2013-04-09 DIAGNOSIS — I4729 Other ventricular tachycardia: Secondary | ICD-10-CM | POA: Diagnosis not present

## 2013-04-09 DIAGNOSIS — F17200 Nicotine dependence, unspecified, uncomplicated: Secondary | ICD-10-CM | POA: Diagnosis present

## 2013-04-09 DIAGNOSIS — D631 Anemia in chronic kidney disease: Secondary | ICD-10-CM | POA: Diagnosis present

## 2013-04-09 DIAGNOSIS — Z7982 Long term (current) use of aspirin: Secondary | ICD-10-CM

## 2013-04-09 DIAGNOSIS — N039 Chronic nephritic syndrome with unspecified morphologic changes: Secondary | ICD-10-CM

## 2013-04-09 NOTE — ED Notes (Addendum)
Per EMS and Patient CP started around 1 pm today, with slight SOB,  Stated when EMS got there HR was 150-160  Presumably SVT and when they stood patient up and moved to bed to lay down, she convated   to 80bpm Afib, pt has hx of Afib, DM,  And dialysis Mon, wed and Fri. Stated has [place] yesterday. BP 120/80, HR 80, pt received one Nitro SL    Brought pain from 6/10 to 4/10. Hx open surgery done  feb 10vth 2015, in SNF for rehab treatment. Hx of kidney transplant.

## 2013-04-10 ENCOUNTER — Encounter (HOSPITAL_COMMUNITY): Payer: Self-pay | Admitting: Internal Medicine

## 2013-04-10 DIAGNOSIS — R079 Chest pain, unspecified: Secondary | ICD-10-CM

## 2013-04-10 DIAGNOSIS — I359 Nonrheumatic aortic valve disorder, unspecified: Secondary | ICD-10-CM

## 2013-04-10 LAB — CBC WITH DIFFERENTIAL/PLATELET
BASOS PCT: 1 % (ref 0–1)
Basophils Absolute: 0 10*3/uL (ref 0.0–0.1)
EOS ABS: 0.4 10*3/uL (ref 0.0–0.7)
Eosinophils Relative: 7 % — ABNORMAL HIGH (ref 0–5)
HEMATOCRIT: 29.9 % — AB (ref 36.0–46.0)
HEMOGLOBIN: 9.6 g/dL — AB (ref 12.0–15.0)
Lymphocytes Relative: 32 % (ref 12–46)
Lymphs Abs: 2.1 10*3/uL (ref 0.7–4.0)
MCH: 30.1 pg (ref 26.0–34.0)
MCHC: 32.1 g/dL (ref 30.0–36.0)
MCV: 93.7 fL (ref 78.0–100.0)
MONO ABS: 0.6 10*3/uL (ref 0.1–1.0)
MONOS PCT: 8 % (ref 3–12)
Neutro Abs: 3.5 10*3/uL (ref 1.7–7.7)
Neutrophils Relative %: 53 % (ref 43–77)
Platelets: 159 10*3/uL (ref 150–400)
RBC: 3.19 MIL/uL — AB (ref 3.87–5.11)
RDW: 15.9 % — ABNORMAL HIGH (ref 11.5–15.5)
WBC: 6.6 10*3/uL (ref 4.0–10.5)

## 2013-04-10 LAB — COMPREHENSIVE METABOLIC PANEL
ALBUMIN: 2.6 g/dL — AB (ref 3.5–5.2)
ALK PHOS: 95 U/L (ref 39–117)
ALT: 14 U/L (ref 0–35)
AST: 23 U/L (ref 0–37)
BUN: 37 mg/dL — AB (ref 6–23)
CALCIUM: 8.7 mg/dL (ref 8.4–10.5)
CO2: 28 mEq/L (ref 19–32)
Chloride: 98 mEq/L (ref 96–112)
Creatinine, Ser: 8.02 mg/dL — ABNORMAL HIGH (ref 0.50–1.10)
GFR calc Af Amer: 6 mL/min — ABNORMAL LOW (ref >90)
GFR calc non Af Amer: 5 mL/min — ABNORMAL LOW (ref >90)
GLUCOSE: 158 mg/dL — AB (ref 70–99)
POTASSIUM: 3.7 meq/L (ref 3.7–5.3)
Sodium: 143 mEq/L (ref 137–147)
Total Bilirubin: 0.3 mg/dL (ref 0.3–1.2)
Total Protein: 6.7 g/dL (ref 6.0–8.3)

## 2013-04-10 LAB — GLUCOSE, CAPILLARY
GLUCOSE-CAPILLARY: 124 mg/dL — AB (ref 70–99)
GLUCOSE-CAPILLARY: 119 mg/dL — AB (ref 70–99)
Glucose-Capillary: 150 mg/dL — ABNORMAL HIGH (ref 70–99)
Glucose-Capillary: 134 mg/dL — ABNORMAL HIGH (ref 70–99)
Glucose-Capillary: 146 mg/dL — ABNORMAL HIGH (ref 70–99)

## 2013-04-10 LAB — BASIC METABOLIC PANEL
BUN: 34 mg/dL — AB (ref 6–23)
CO2: 25 mEq/L (ref 19–32)
CREATININE: 7.53 mg/dL — AB (ref 0.50–1.10)
Calcium: 8.6 mg/dL (ref 8.4–10.5)
Chloride: 96 mEq/L (ref 96–112)
GFR, EST AFRICAN AMERICAN: 6 mL/min — AB (ref >90)
GFR, EST NON AFRICAN AMERICAN: 5 mL/min — AB (ref >90)
Glucose, Bld: 196 mg/dL — ABNORMAL HIGH (ref 70–99)
Potassium: 3.4 mEq/L — ABNORMAL LOW (ref 3.7–5.3)
Sodium: 141 mEq/L (ref 137–147)

## 2013-04-10 LAB — PROTIME-INR
INR: 1.44 (ref 0.00–1.49)
INR: 1.62 — ABNORMAL HIGH (ref 0.00–1.49)
PROTHROMBIN TIME: 18.8 s — AB (ref 11.6–15.2)
Prothrombin Time: 17.2 seconds — ABNORMAL HIGH (ref 11.6–15.2)

## 2013-04-10 LAB — CBC
HEMATOCRIT: 28.8 % — AB (ref 36.0–46.0)
HEMOGLOBIN: 9.4 g/dL — AB (ref 12.0–15.0)
MCH: 30.7 pg (ref 26.0–34.0)
MCHC: 32.6 g/dL (ref 30.0–36.0)
MCV: 94.1 fL (ref 78.0–100.0)
Platelets: 136 10*3/uL — ABNORMAL LOW (ref 150–400)
RBC: 3.06 MIL/uL — ABNORMAL LOW (ref 3.87–5.11)
RDW: 16.1 % — ABNORMAL HIGH (ref 11.5–15.5)
WBC: 6.6 10*3/uL (ref 4.0–10.5)

## 2013-04-10 LAB — HEPARIN LEVEL (UNFRACTIONATED): Heparin Unfractionated: 0.32 IU/mL (ref 0.30–0.70)

## 2013-04-10 LAB — TROPONIN I
Troponin I: <0.3 ng/mL (ref <0.30)
Troponin I: <0.3 ng/mL (ref <0.30)

## 2013-04-10 LAB — MAGNESIUM: Magnesium: 2.1 mg/dL (ref 1.5–2.5)

## 2013-04-10 MED ORDER — NICOTINE 7 MG/24HR TD PT24
7.0000 mg | MEDICATED_PATCH | Freq: Every day | TRANSDERMAL | Status: DC
  Administered 2013-04-10 – 2013-04-15 (×6): 7 mg via TRANSDERMAL
  Filled 2013-04-10 (×6): qty 1

## 2013-04-10 MED ORDER — ADULT MULTIVITAMIN W/MINERALS CH
1.0000 | ORAL_TABLET | Freq: Every day | ORAL | Status: DC
  Administered 2013-04-10 – 2013-04-11 (×2): 1 via ORAL
  Filled 2013-04-10 (×2): qty 1

## 2013-04-10 MED ORDER — CALCIUM CARBONATE ANTACID 500 MG PO CHEW
1.0000 | CHEWABLE_TABLET | Freq: Every day | ORAL | Status: DC
  Administered 2013-04-10 – 2013-04-15 (×6): 200 mg via ORAL
  Filled 2013-04-10 (×6): qty 1

## 2013-04-10 MED ORDER — ALBUTEROL SULFATE (2.5 MG/3ML) 0.083% IN NEBU: 2.5000 mg | INHALATION_SOLUTION | Freq: Four times a day (QID) | RESPIRATORY_TRACT | Status: DC | PRN

## 2013-04-10 MED ORDER — TACROLIMUS 1 MG PO CAPS
5.0000 mg | ORAL_CAPSULE | Freq: Two times a day (BID) | ORAL | Status: DC
  Administered 2013-04-10 – 2013-04-15 (×10): 5 mg via ORAL
  Filled 2013-04-10 (×12): qty 5

## 2013-04-10 MED ORDER — ASPIRIN EC 81 MG PO TBEC
81.0000 mg | DELAYED_RELEASE_TABLET | Freq: Every day | ORAL | Status: DC
  Administered 2013-04-10 – 2013-04-15 (×6): 81 mg via ORAL
  Filled 2013-04-10 (×6): qty 1

## 2013-04-10 MED ORDER — BUDESONIDE-FORMOTEROL FUMARATE 160-4.5 MCG/ACT IN AERO
2.0000 | INHALATION_SPRAY | Freq: Two times a day (BID) | RESPIRATORY_TRACT | Status: DC
  Administered 2013-04-10 – 2013-04-15 (×8): 2 via RESPIRATORY_TRACT
  Filled 2013-04-10 (×3): qty 6

## 2013-04-10 MED ORDER — METOPROLOL TARTRATE 25 MG PO TABS
25.0000 mg | ORAL_TABLET | Freq: Two times a day (BID) | ORAL | Status: DC
  Filled 2013-04-10 (×2): qty 1

## 2013-04-10 MED ORDER — GABAPENTIN 300 MG PO CAPS
300.0000 mg | ORAL_CAPSULE | Freq: Every day | ORAL | Status: DC
  Administered 2013-04-10 – 2013-04-14 (×4): 300 mg via ORAL
  Filled 2013-04-10 (×7): qty 1

## 2013-04-10 MED ORDER — ALBUTEROL 90 MCG/ACT IN AERS: 2.0000 | INHALATION_SPRAY | Freq: Four times a day (QID) | RESPIRATORY_TRACT | Status: DC | PRN

## 2013-04-10 MED ORDER — LEVOTHYROXINE SODIUM 150 MCG PO TABS
150.0000 ug | ORAL_TABLET | Freq: Every day | ORAL | Status: DC
  Administered 2013-04-10 – 2013-04-15 (×6): 150 ug via ORAL
  Filled 2013-04-10 (×8): qty 1

## 2013-04-10 MED ORDER — PANTOPRAZOLE SODIUM 40 MG PO TBEC
40.0000 mg | DELAYED_RELEASE_TABLET | Freq: Every day | ORAL | Status: DC
  Administered 2013-04-10 – 2013-04-15 (×6): 40 mg via ORAL
  Filled 2013-04-10 (×6): qty 1

## 2013-04-10 MED ORDER — SENNA 8.6 MG PO TABS
1.0000 | ORAL_TABLET | Freq: Every evening | ORAL | Status: DC
  Administered 2013-04-10 – 2013-04-13 (×4): 8.6 mg via ORAL
  Filled 2013-04-10 (×6): qty 1

## 2013-04-10 MED ORDER — DULOXETINE HCL 60 MG PO CPEP
60.0000 mg | ORAL_CAPSULE | Freq: Every day | ORAL | Status: DC
  Administered 2013-04-10 – 2013-04-14 (×4): 60 mg via ORAL
  Filled 2013-04-10 (×6): qty 1

## 2013-04-10 MED ORDER — NITROGLYCERIN 0.4 MG SL SUBL: 0.4000 mg | SUBLINGUAL_TABLET | SUBLINGUAL | Status: DC | PRN

## 2013-04-10 MED ORDER — INSULIN ASPART 100 UNIT/ML ~~LOC~~ SOLN
0.0000 [IU] | Freq: Three times a day (TID) | SUBCUTANEOUS | Status: DC
  Administered 2013-04-10: 1 [IU] via SUBCUTANEOUS
  Administered 2013-04-10: 2 [IU] via SUBCUTANEOUS
  Administered 2013-04-11: 1 [IU] via SUBCUTANEOUS
  Administered 2013-04-12 – 2013-04-13 (×5): 2 [IU] via SUBCUTANEOUS
  Administered 2013-04-14: 1 [IU] via SUBCUTANEOUS
  Administered 2013-04-14 (×2): 2 [IU] via SUBCUTANEOUS

## 2013-04-10 MED ORDER — WARFARIN - PHARMACIST DOSING INPATIENT: Freq: Every day | Status: DC

## 2013-04-10 MED ORDER — WARFARIN SODIUM 4 MG PO TABS
4.0000 mg | ORAL_TABLET | Freq: Once | ORAL | Status: AC
  Administered 2013-04-10: 4 mg via ORAL
  Filled 2013-04-10: qty 1

## 2013-04-10 MED ORDER — DOCUSATE SODIUM 100 MG PO CAPS
100.0000 mg | ORAL_CAPSULE | Freq: Every day | ORAL | Status: DC
  Administered 2013-04-10 – 2013-04-12 (×3): 100 mg via ORAL
  Filled 2013-04-10 (×6): qty 1

## 2013-04-10 MED ORDER — CINACALCET HCL 30 MG PO TABS
30.0000 mg | ORAL_TABLET | Freq: Every day | ORAL | Status: DC
  Administered 2013-04-10 – 2013-04-15 (×5): 30 mg via ORAL
  Filled 2013-04-10 (×7): qty 1

## 2013-04-10 MED ORDER — FE FUMARATE-B12-VIT C-FA-IFC PO CAPS
1.0000 | ORAL_CAPSULE | Freq: Three times a day (TID) | ORAL | Status: DC
  Administered 2013-04-10 – 2013-04-11 (×5): 1 via ORAL
  Filled 2013-04-10 (×7): qty 1

## 2013-04-10 MED ORDER — LORAZEPAM 0.5 MG PO TABS
0.5000 mg | ORAL_TABLET | Freq: Every day | ORAL | Status: DC
  Administered 2013-04-10 – 2013-04-14 (×4): 0.5 mg via ORAL
  Filled 2013-04-10 (×4): qty 1

## 2013-04-10 MED ORDER — HEPARIN (PORCINE) IN NACL 100-0.45 UNIT/ML-% IJ SOLN
1200.0000 [IU]/h | INTRAMUSCULAR | Status: DC
  Administered 2013-04-10 – 2013-04-12 (×3): 1000 [IU]/h via INTRAVENOUS
  Administered 2013-04-13: 1100 [IU]/h via INTRAVENOUS
  Administered 2013-04-14 – 2013-04-15 (×2): 1200 [IU]/h via INTRAVENOUS
  Filled 2013-04-10 (×8): qty 250

## 2013-04-10 MED ORDER — METOPROLOL TARTRATE 25 MG PO TABS
37.5000 mg | ORAL_TABLET | Freq: Two times a day (BID) | ORAL | Status: DC
  Administered 2013-04-10 – 2013-04-12 (×4): 37.5 mg via ORAL
  Filled 2013-04-10 (×7): qty 1

## 2013-04-10 NOTE — ED Provider Notes (Signed)
CSN: [id number]     Arrival date & time 04/09/13  2303 History   First MD Initiated Contact with Patient 04/09/13 2330     Chief Complaint  Patient presents with  . Chest Pain     (Consider location/radiation/quality/duration/timing/severity/associated sxs/prior Treatment) HPI 58 year old female presents to emergency department from her nursing facility via EMS with complaint of chest pain.  She, reports she's had chest pain since about 1:00 today.  Pain was..  Patient did not think much of her chest pain, and she recently had open heart surgery.  However, as she was getting ready for bed.  Approximately 2 hours prior to arrival, she began to have different chest pain and significant shortness of breath and noticed that she was having a fast heart rate.  Patient reports she was given metoprolol and another unknown medication by the nursing home nurse.  They also placed her hand in ice water.  Patient reports her heart rate maxed at 158.  EMS reports upon their arrival, patient had heart rates 150-160.  No tracing obtained, while transferring patient from bed to EMS stretcher.  She converted to normal sinus rhythm, 80 beats her minute.  Patient is 29 days out from open heart surgery for aortic valve replacement and had one coronary artery vessel bypass placed.  Patient has history of end-stage renal disease, and was dialyzed yesterday. Patient reports she is still having some dull chest pain, but palpitations and shortness of breath have resolved Past Medical History  Diagnosis Date  . 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 Chronic back pain   . CAD (coronary artery disease)   . Obesity   . Peripheral vascular disease hands and legs  . Anxiety   . Asthma   . GERD (gastroesophageal reflux disease)     takes Omeprazole daily  . Chronic constipation     takes MIralax and Colace daily  . Anemia     takes Folic Acid  daily  . Hypothyroidism     takes Synthroid daily  . Depression     takes Cymbalta for "severe" depression  . Neuromuscular disorder     carpal tunnel in right hand  . Hypertension     takes Metoprolol and Lisinopril daily, sees Dr Bea Graff  . COPD (chronic obstructive pulmonary disease)   . Pneumonia     "today and several times before" (08/30/2012)  . Chronic bronchitis     "q yr w/season changes" (08/30/2012)  . Type II diabetes mellitus     Levemir 2units daily if > 150  . History of blood transfusion     "several" (08/30/2012)  . Hepatitis C   . Migraine     "last migraine was in 2013" (08/30/2012)  . Headache     "at least monthly" (08/30/2012)  . Arthritis     "left hand, back" (08/30/2012)  . End stage renal disease on dialysis 02/27/2011    Kidneys shut down at time of liver transplant in Sept 2011 at [facility] in [place], she has been on HD ever since.  Dialyzes at [facility] HD on TTS schedule.  Had L forearm graft used 10 months then removed Dec 2012 due to suspected infection.  A right upper arm AVG was placed Dec 2012 but she developed steal symptoms acutely and it was ligated the same day.  Never had an AV fistula due to small  veins.  Now has L thigh AVG put in Jan 2013, has not clotted to date.    [name] CAD (coronary artery disease) Jan. 2015    Cath: 20% LAD, 50% D1   Past Surgical History  Procedure Laterality Date  . Liver transplant  10/25/2009    sees Dr Ferol Luz 1 every 6 months, saw last in Dec 2013. [name] Coord [phone]  . Small intestine surgery  90's  . Thrombectomy    . Arteriovenous graft placement Left 10/03/10     forearm  . Avgg removal  12/23/2010    Procedure: REMOVAL OF ARTERIOVENOUS GORETEX GRAFT (Gray);  Surgeon: Elam Dutch, MD;  Location: [place];  Service: Vascular;  Laterality: Left;  procedure started @1736 -1852  . Insertion of dialysis catheter  12/23/2010    Procedure: INSERTION OF DIALYSIS CATHETER;  Surgeon: Elam Dutch, MD;   Location: [place];  Service: Vascular;  Laterality: Right;  Right Internal Jugular 28cm dialysis catheter insertion procedure time 1701-1720   . Cholecystectomy  1993  . Cystoscopy  1990's  . Spinal growth rods  2010    "put 2 metal rods in my back; they had detetriorated" (08/30/2012)  . Av fistula placement  01/29/2011    Procedure: INSERTION OF ARTERIOVENOUS (AV) GORE-TEX GRAFT ARM;  Surgeon: Elam Dutch, MD;  Location: [place];  Service: Vascular;  Laterality: Right;  . Av fistula placement  03/10/2011    Procedure: INSERTION OF ARTERIOVENOUS (AV) GORE-TEX GRAFT THIGH;  Surgeon: Elam Dutch, MD;  Location: [place];  Service: Vascular;  Laterality: Left;  . Tubal ligation  1990's  . Cardiac catheterization  2014  . Aortic valve replacement N/A 03/15/2013    Procedure: AORTIC VALVE REPLACEMENT (AVR);  Surgeon: Ivin Poot, MD;  Location: [place];  Service: Open Heart Surgery;  Laterality: N/A;  . Coronary artery bypass graft N/A 03/15/2013    Procedure: CORONARY ARTERY BYPASS GRAFTING (CABG) times one using left internal mammary artery.;  Surgeon: Ivin Poot, MD;  Location: [place];  Service: Open Heart Surgery;  Laterality: N/A;  POSS CABG X 1  . Intraoperative transesophageal echocardiogram N/A 03/15/2013    Procedure: INTRAOPERATIVE TRANSESOPHAGEAL ECHOCARDIOGRAM;  Surgeon: Ivin Poot, MD;  Location: [place];  Service: Open Heart Surgery;  Laterality: N/A;   Family History  Problem Relation Age of Onset  . Cancer Mother   . Diabetes Mother   . Hypertension Mother   . Stroke Mother   . Cancer Father   . Anesthesia problems Neg Hx   . Hypotension Neg Hx   . Malignant hyperthermia Neg Hx   . Pseudochol deficiency Neg Hx    History  Substance Use Topics  . Smoking status: Current Every Day Smoker -- 0.75 packs/day for 40 years    Types: Cigarettes  . Smokeless tobacco: Never Used  . Alcohol Use: Yes     Comment: 08/30/2012 "last drink was at a wedding July, 2014; had a small  glass of wine; never had problems w/alcohol"   OB History   Grav Para Term Preterm Abortions TAB SAB Ect Mult Living                 Review of Systems  See History of Present Illness; otherwise all other systems are reviewed and negative   Allergies  Acetaminophen and Codeine  Home Medications   Current Outpatient Rx  Name  Route  Sig  Dispense  Refill  . albuterol (PROVENTIL,VENTOLIN) 90 MCG/ACT  inhaler   Inhalation   Inhale 1-2 puffs into the lungs every 6 (six) hours as needed for wheezing or shortness of breath.          [name] aspirin EC 81 MG tablet   Oral   Take 81 mg by mouth daily.           . budesonide-formoterol (SYMBICORT) 160-4.5 MCG/ACT inhaler   Inhalation   Inhale 2 puffs into the lungs 2 (two) times daily.   1 Inhaler   12   . calcium carbonate (TUMS - DOSED IN MG ELEMENTAL CALCIUM) 500 MG chewable tablet   Oral   Chew 1 tablet by mouth daily.         . cinacalcet (SENSIPAR) 30 MG tablet   Oral   Take 30 mg by mouth daily.         [name] docusate sodium (COLACE) 100 MG capsule   Oral   Take 100 mg by mouth daily.          . DULoxetine (CYMBALTA) 60 MG capsule   Oral   Take 60 mg by mouth at bedtime.          . ferrous [id number] C-folic acid (TRINSICON / FOLTRIN) capsule   Oral   Take 1 capsule by mouth 3 (three) times daily after meals.         . gabapentin (NEURONTIN) 300 MG capsule   Oral   Take 300 mg by mouth at bedtime.          [name] ibuprofen (ADVIL,MOTRIN) 200 MG tablet   Oral   Take 1 tablet (200 mg total) by mouth every 6 (six) hours as needed for moderate pain.   30 tablet   0   . insulin detemir (LEVEMIR) 100 UNIT/ML injection   Subcutaneous   Inject 2-3 Units into the skin at bedtime as needed (high blood sugar). Uses only if blood sugar levels are over 150         . levothyroxine (SYNTHROID, LEVOTHROID) 150 MCG tablet   Oral   Take 150 mcg by mouth daily before breakfast.         . LORazepam  (ATIVAN) 0.5 MG tablet   Oral   Take 0.5 mg by mouth at bedtime.          . metoprolol tartrate (LOPRESSOR) 25 MG tablet   Oral   Take 1 tablet (25 mg total) by mouth 2 (two) times daily.   60 tablet   1   . Multiple Vitamin (MULTIVITAMIN WITH MINERALS) TABS tablet   Oral   Take 1 tablet by mouth daily.         . nicotine (NICODERM CQ - DOSED IN MG/24 HR) 7 mg/24hr patch   Transdermal   Place 7 mg onto the skin daily.         . Nutritional Supplements (FEEDING SUPPLEMENT, NEPRO CARB STEADY,) LIQD   Oral   Take 237 mLs by mouth 2 (two) times daily between meals.      0   . omeprazole (PRILOSEC) 20 MG capsule   Oral   Take 20 mg by mouth daily.         . promethazine (PHENERGAN) 12.5 MG tablet   Oral   Take 12.5 mg by mouth every 4 (four) hours as needed for nausea.          [name] senna (SENOKOT) 8.6 MG TABS tablet   Oral   Take 1 tablet by mouth every evening.         [name]  tacrolimus (PROGRAF) 1 MG capsule   Oral   Take 5 mg by mouth 2 (two) times daily.         [name] warfarin (COUMADIN) 2 MG tablet   Oral   Take 1 tablet (2 mg total) by mouth daily at 6 PM. Or as directed by [facility]          BP 109/90  Temp(Src) 98.9 F (37.2 C) (Oral)  Resp 16  Ht 5\' 4"  (1.626 m)  Wt 160 lb (72.576 kg)  BMI 27.45 kg/m2  SpO2 97% Physical Exam  Nursing note and vitals reviewed. Constitutional: She is oriented to person, place, and time. She appears well-developed and well-nourished. No distress.  HENT:  Head: Normocephalic and atraumatic.  Right Ear: External ear normal.  Left Ear: External ear normal.  Nose: Nose normal.  Mouth/Throat: Oropharynx is clear and moist.  Eyes: Conjunctivae and EOM are normal. Pupils are equal, round, and reactive to light.  Neck: Normal range of motion. Neck supple. No JVD present. No tracheal deviation present. No thyromegaly present.  Cardiovascular: Normal rate, regular rhythm, normal heart sounds and intact distal pulses.   Exam reveals no gallop and no friction rub.   No murmur heard. Valve click noted.  Surgical incision healing well  Pulmonary/Chest: Effort normal and breath sounds normal. No stridor. No respiratory distress. She has no wheezes. She has no rales. She exhibits no tenderness.  Abdominal: Soft. Bowel sounds are normal. She exhibits no distension and no mass. There is no tenderness. There is no rebound and no guarding.  Musculoskeletal: Normal range of motion. She exhibits no edema and no tenderness.  Shut noted in left leg, thrill present  Lymphadenopathy:    She has no cervical adenopathy.  Neurological: She is alert and oriented to person, place, and time. She has normal reflexes. No cranial nerve deficit. She exhibits normal muscle tone. Coordination normal.  Skin: Skin is warm and dry. No rash noted. No erythema. No pallor.  Psychiatric: She has a normal mood and affect. Her behavior is normal. Judgment and thought content normal.    ED Course  Procedures (including critical care time) Labs Review Labs Reviewed  CBC WITH DIFFERENTIAL - Abnormal; Notable for the following:    RBC 3.19 (*)    Hemoglobin 9.6 (*)    HCT 29.9 (*)    RDW 15.9 (*)    Eosinophils Relative 7 (*)    All other components within normal limits  BASIC METABOLIC PANEL - Abnormal; Notable for the following:    Potassium 3.4 (*)    Glucose, Bld 196 (*)    BUN 34 (*)    Creatinine, Ser 7.53 (*)    GFR calc non Af Amer 5 (*)    GFR calc Af Amer 6 (*)    All other components within normal limits  PROTIME-INR - Abnormal; Notable for the following:    Prothrombin Time 17.2 (*)    All other components within normal limits  TROPONIN I   Imaging Review Dg Chest 2 View  04/10/2013   CLINICAL DATA:  Chest pain, valve replacement  EXAM: CHEST  2 VIEW  COMPARISON:  03/26/2013  FINDINGS: Left basilar opacity, likely atelectasis. Probable trace right pleural effusion, decreased. No pneumothorax.  The heart is top-normal in  size. Postsurgical changes related to prior CABG. Prosthetic aortic valve.  Cholecystectomy clips.  Mild degenerative changes of the visualized thoracolumbar spine. Lumbar spine fixation hardware, incompletely visualized.  IMPRESSION: Left basilar opacity, likely  atelectasis.  Probable trace right pleural effusion, decreased.   Electronically Signed   By: Julian Hy M.D.   On: 04/10/2013 00:19     EKG Interpretation   Date/Time:  Saturday April 09 2013 23:31:26 EST Ventricular Rate:  79 PR Interval:  221 QRS Duration: 92 QT Interval:  435 QTC Calculation: 499 R Axis:   -26 Text Interpretation:  Sinus rhythm Prolonged PR interval LVH with  secondary repolarization abnormality Anterior Q waves, possibly due to LVH  New t wave inversion V2 compared to prior Confirmed by Charlize Hathaway  MD, Russie Gulledge  ([id number]) on 04/09/2013 11:40:07 PM      MDM   Final diagnoses:  Chest pain  S/P AVR  Paroxysmal SVT (supraventricular tachycardia)    57 year old female with what sounds to be SVT that converted.  He is feeling better.  Given recent open heart surgery, we'll check labs, chest x-ray.  EKG is concerning that she has a new T. wave inversion in V2 compared to prior, which appears to be Wellens like.  Will d/w cardiology.    Kalman Drape, MD 04/10/13 [phone]

## 2013-04-10 NOTE — Care Management Utilization Note (Signed)
UR complete    Eaden Hettinger,MSN,RN [phone] 

## 2013-04-10 NOTE — Progress Notes (Signed)
  Echocardiogram 2D Echocardiogram has been performed.  [name], [place] 04/10/2013, 11:35 AM

## 2013-04-10 NOTE — Progress Notes (Signed)
Patient ID: DEMIRA GWYNNE, female   DOB: 24-Apr-1955, 58 y.o.   MRN: [id number]      Subjective:    No complaints this morning.   Objective:   Temp:  [97.8 F (36.6 C)-98.9 F (37.2 C)] 98.3 F (36.8 C) (03/01 0533) Pulse Rate:  [65-79] 65 (03/01 0533) Resp:  [15-24] 18 (03/01 0533) BP: (73-138)/(35-90) 138/48 mmHg (03/01 0533) SpO2:  [89 %-100 %] 100 % (03/01 0533) Weight:  [160 lb (72.576 kg)-165 lb 2 oz (74.9 kg)] 165 lb 2 oz (74.9 kg) (03/01 0533) Last BM Date: 04/09/13  Filed Weights   04/09/13 2321 04/10/13 0533  Weight: 160 lb (72.576 kg) 165 lb 2 oz (74.9 kg)   No intake or output data in the 24 hours ending 04/10/13 0937  Telemetry:NSR  Exam:  General:NAD  Resp: CTAB  Cardiac: RRR, mechanical S2, 2/6 systolic murmur RUSB  ON:[id number] soft, NT, ND  MSK: LEs warm, no edema  Neuro: no focal deficits    Lab Results:  Basic Metabolic Panel:  Recent Labs Lab 04/10/13 0125 04/10/13 0734  NA 141 143  K 3.4* 3.7  CL 96 98  CO2 25 28  GLUCOSE 196* 158*  BUN 34* 37*  CREATININE 7.53* 8.02*  CALCIUM 8.6 8.7  MG  --  2.1    Liver Function Tests:  Recent Labs Lab 04/10/13 0734  AST 23  ALT 14  ALKPHOS 95  BILITOT 0.3  PROT 6.7  ALBUMIN 2.6*    CBC:  Recent Labs Lab 04/10/13 0125 04/10/13 0734  WBC 6.6 6.6  HGB 9.6* 9.4*  HCT 29.9* 28.8*  MCV 93.7 94.1  PLT 159 136*    Cardiac Enzymes:  Recent Labs Lab 04/10/13 0125 04/10/13 0735  TROPONINI <0.30 <0.30    BNP:  Recent Labs  07/20/12 0154 08/30/12 0758 09/01/12 0555  PROBNP 42149.0* 41152.0* 32077.0*    Coagulation:  Recent Labs Lab 04/10/13 0125 04/10/13 0734  INR 1.44 1.62*    ECG:    Medications:   Scheduled Medications: . aspirin EC  81 mg Oral Daily  . budesonide-formoterol  2 puff Inhalation BID  . calcium carbonate  1 tablet Oral Daily  . cinacalcet  30 mg Oral Q breakfast  . docusate sodium  100 mg Oral Daily  . DULoxetine  60 mg Oral QHS    . ferrous [id number] C-folic acid  1 capsule Oral TID PC  . gabapentin  300 mg Oral QHS  . insulin aspart  0-9 Units Subcutaneous TID WC  . levothyroxine  150 mcg Oral QAC breakfast  . LORazepam  0.5 mg Oral QHS  . metoprolol tartrate  25 mg Oral BID  . multivitamin with minerals  1 tablet Oral Daily  . nicotine  7 mg Transdermal Daily  . pantoprazole  40 mg Oral Daily  . senna  1 tablet Oral QPM  . tacrolimus  5 mg Oral BID  . warfarin  4 mg Oral ONCE-1800  . Warfarin - Pharmacist Dosing Inpatient   Does not apply q1800     Infusions: . heparin 1,000 Units/hr (04/10/13 0651)     PRN Medications:  albuterol, nitroGLYCERIN  02/2013 Cath HEMODYNAMICS:  RA: mean 7  RV: 35/7  PA: 35/17  Pc: a 19 v 17, mean 15  Initial AO: 170/81  LV: 199:15  Pullback: LV: 195/23  Ao: 171/78  CO: 3.7 (Thermo); 5.4 (Fick)  CI: 2.0 3.0  AVA: 0.8 cm2 1.2 cm2  ANGIOGRAPHY:  1. Left main:  Angiographically normal vessel which bifurcated into the LAD and left circumflex coronary artery  2. LAD: Moderate size vessel that gave rise to a proximal diagonal vessel. There was a smooth 50% ostial narrowing in this diagonal vessel. The LAD between the first and second diagonal vessel had luminal irregularities and narrowing of 10-20%. The LAD extended to the LV apex.  3. Left circumflex: Angiographically normal vessel which gave rise to 2 small marginal vessels.  4. Right coronary artery: Angiographically normal dominant vessel which gave rise to a large PDA and PLA system with 2 small inferior LV branches.  Left ventriculography revealed lethargic hypertrophy. Ejection fraction was approximately 50%, without focal segmental wall motion abnormalities or mitral regurgitation.  Supravalvular aortography revealed moderately reduced aortic valve excursion but with mobility. There was no angiographic aortic insufficiency.  IMPRESSION:  Normal LV function with an ejection fraction of approximately 50%  with evidence for left ventricular hypertrophy.  Mild coronary obstructive disease with 50% narrowing at the ostium of the first diagonal branch of the LAD with 10-20% luminal irregularities of the proximal to mid LAD and otherwise normal left circumflex and right coronary artery.  Moderately severe aortic valve stenosis with a peak to peak gradient ranging from 24-29 mmHg, and a mean gradient of 24 mm Hg. Aortic valve area calculates to 0.8-1.2 depending upon cardiac output determination consistent with moderately severe aortic valve stenosis.  RECOMMENDATION:  Angiographic findings will be reviewed the patient's primary cardiologist. In light of the patient's significant symptomatic status consideration for valve replacement surgery will be necessary.    Assessment/Plan    58 yo female hx of CAD with single vessel CABG 02/2013, severe AS s/p mechanical AVR 02/2013, DM, HTN, ESRD, ESLD s/p transplant admitted with chest pain and palpitations.   1. CAD - cath Jan 2015 with moderate diagonal and LAD disease, s/p LIMA to LAD CABG.  - EKG LVH with chronic strain pattern, troponins negative x 2.  - symptoms not consistent with angina, unlikely given recent cath and single vessel CABG  2. Aortic stenosis - mechanical AVR, on coumadin with subtherapeutic INR - contiue hep gtt  3. Palpitations - heart rate reported 150s at rehab, initial EKG here NSR rate 87. No evidence of tachycardia by vitals here or tele. Episode apparently broke immediately with resolution of symptoms prior to presenting to ER.  - continue tele - normal TSH Jan 27,2015, will not repeat - uptitrate metoprolol, has had some soft pressures but normal this morning. Will follow.   4. Chronic immunosuppression - levels pending, continue home doses  5. ESRD - order placed to consult nephrology for HD MWF.  Carlyle Dolly, M.D., F.A.C.C.

## 2013-04-10 NOTE — Progress Notes (Signed)
ANTICOAGULATION CONSULT NOTE - Initial Consult  Pharmacy Consult for Coumadin and heparin Indication: AVR  Allergies  Allergen Reactions  . Acetaminophen Other (See Comments)    Liver transplant recipient   . Codeine Itching    Patient Measurements: Height: 5\' 4"  (162.6 cm) Weight: 165 lb 2 oz (74.9 kg) IBW/kg (Calculated) : 54.7 Heparin Dosing Weight: 70kg  Vital Signs: Temp: 98.3 F (36.8 C) (03/01 0533) Temp src: Oral (03/01 0533) BP: 138/48 mmHg (03/01 0533) Pulse Rate: 65 (03/01 0533)  Labs:  Recent Labs  04/10/13 0125  HGB 9.6*  HCT 29.9*  PLT 159  LABPROT 17.2*  INR 1.44  CREATININE 7.53*  TROPONINI <0.30    Estimated Creatinine Clearance: 8.1 ml/min (by C-G formula based on Cr of 7.53).   Medical History: Past Medical History  Diagnosis Date  . 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 Chronic back pain   . CAD (coronary artery disease)   . Obesity   . Peripheral vascular disease hands and legs  . Anxiety   . Asthma   . GERD (gastroesophageal reflux disease)     takes Omeprazole daily  . Chronic constipation     takes MIralax and Colace daily  . Anemia     takes Folic Acid daily  . Hypothyroidism     takes Synthroid daily  . Depression     takes Cymbalta for "severe" depression  . Neuromuscular disorder     carpal tunnel in right hand  . Hypertension     takes Metoprolol and Lisinopril daily, sees Dr Bea Graff  . COPD (chronic obstructive pulmonary disease)   . Pneumonia     "today and several times before" (08/30/2012)  . Chronic bronchitis     "q yr w/season changes" (08/30/2012)  . Type II diabetes mellitus     Levemir 2units daily if > 150  . History of blood transfusion     "several" (08/30/2012)  . Hepatitis C   . Migraine     "last migraine was in 2013" (08/30/2012)  . Headache     "at least monthly" (08/30/2012)  . Arthritis     "left hand, back"  (08/30/2012)  . End stage renal disease on dialysis 02/27/2011    Kidneys shut down at time of liver transplant in Sept 2011 at [facility] in [place], she has been on HD ever since.  Dialyzes at [facility] HD on TTS schedule.  Had L forearm graft used 10 months then removed Dec 2012 due to suspected infection.  A right upper arm AVG was placed Dec 2012 but she developed steal symptoms acutely and it was ligated the same day.  Never had an AV fistula due to small veins.  Now has L thigh AVG put in Jan 2013, has not clotted to date.    [name] CAD (coronary artery disease) Jan. 2015    Cath: 20% LAD, 50% D1    Medications:  Prescriptions prior to admission  Medication Sig Dispense Refill  . albuterol (PROVENTIL,VENTOLIN) 90 MCG/ACT inhaler Inhale 1-2 puffs into the lungs every 6 (six) hours as needed for wheezing or shortness of breath.       [name] aspirin EC 81 MG tablet Take 81 mg by mouth daily.        . budesonide-formoterol (SYMBICORT) 160-4.5 MCG/ACT inhaler Inhale 2 puffs into the lungs 2 (two) times daily.  1 Inhaler  12  . calcium carbonate (TUMS - DOSED IN MG ELEMENTAL CALCIUM) 500 MG chewable tablet Chew 1 tablet by mouth daily.      . cinacalcet (SENSIPAR) 30 MG tablet Take 30 mg by mouth daily.      [name] docusate sodium (COLACE) 100 MG capsule Take 100 mg by mouth daily.       . DULoxetine (CYMBALTA) 60 MG capsule Take 60 mg by mouth at bedtime.       . ferrous [id number] C-folic acid (TRINSICON / FOLTRIN) capsule Take 1 capsule by mouth 3 (three) times daily after meals.      . gabapentin (NEURONTIN) 300 MG capsule Take 300 mg by mouth at bedtime.       [name] ibuprofen (ADVIL,MOTRIN) 200 MG tablet Take 1 tablet (200 mg total) by mouth every 6 (six) hours as needed for moderate pain.  30 tablet  0  . insulin detemir (LEVEMIR) 100 UNIT/ML injection Inject 2 Units into the skin at bedtime as needed (for CBG greater than 150).      [name] levothyroxine (SYNTHROID, LEVOTHROID) 150 MCG tablet Take 150 mcg by  mouth daily before breakfast.      . LORazepam (ATIVAN) 0.5 MG tablet Take 0.5 mg by mouth at bedtime.       . metoprolol tartrate (LOPRESSOR) 25 MG tablet Take 1 tablet (25 mg total) by mouth 2 (two) times daily.  60 tablet  1  . Multiple Vitamin (MULTIVITAMIN WITH MINERALS) TABS tablet Take 1 tablet by mouth daily.      . nicotine (NICODERM CQ - DOSED IN MG/24 HR) 7 mg/24hr patch Place 7 mg onto the skin daily.      [name] omeprazole (PRILOSEC) 20 MG capsule Take 20 mg by mouth daily.      [name] senna (SENOKOT) 8.6 MG TABS tablet Take 1 tablet by mouth every evening.      . tacrolimus (PROGRAF) 1 MG capsule Take 5 mg by mouth 2 (two) times daily.      [name] UNABLE TO FIND Take 4 oz by mouth 2 (two) times daily. Med Name: Medpass-between meals      . warfarin (COUMADIN) 2 MG tablet Take 2 mg by mouth daily at 6 PM. Or as directed by [facility]      . promethazine (PHENERGAN) 12.5 MG tablet Take 12.5 mg by mouth every 4 (four) hours as needed for nausea.        Scheduled:  . aspirin EC  81 mg Oral Daily  . budesonide-formoterol  2 puff Inhalation BID  . calcium carbonate  1 tablet Oral Daily  . cinacalcet  30 mg Oral Daily  . docusate sodium  100 mg Oral Daily  . DULoxetine  60 mg Oral QHS  . ferrous [id number] C-folic acid  1 capsule Oral TID PC  . gabapentin  300 mg Oral QHS  . insulin aspart  0-9 Units Subcutaneous TID WC  . levothyroxine  150 mcg Oral QAC breakfast  . LORazepam  0.5 mg Oral QHS  . metoprolol tartrate  25 mg Oral BID  . multivitamin with minerals  1 tablet Oral Daily  . nicotine  7 mg Transdermal Daily  . pantoprazole  40 mg Oral Daily  . senna  1 tablet Oral QPM  . tacrolimus  5 mg Oral BID    Assessment: 58yo female s/p CABG and AVR ~3mo ago c/o CP w/ SOB, initially presumably w/ SVT per EMS, DDx include Afib, SVT, PE, VT, pericarditis; to  continue Coumadin though w/ subtherapeutic INR and to begin heparin bridge.  Goal of Therapy:  INR 2-3 (will need to be  confirmed, high thrombogenicity though anemic w/ h/o several transfusions) Heparin level 0.3-0.7 units/ml Monitor platelets by anticoagulation protocol: Yes   Plan:  Will begin heparin gtt at 1000 units/hr and monitor heparin levels and CBC; will give boosted dose of Coumadin 4mg  po x1 today though on recent admission INR increased quickly, will monitor INR for dose adjustments.  Wynona Neat, PharmD, BCPS  04/10/2013,5:45 AM

## 2013-04-10 NOTE — Progress Notes (Signed)
ANTICOAGULATION CONSULT NOTE - Follow Up Consult  Pharmacy Consult for heparin Indication: AVR  Allergies  Allergen Reactions  . Acetaminophen Other (See Comments)    Liver transplant recipient   . Codeine Itching    Patient Measurements: Height: 5\' 4"  (162.6 cm) Weight: 165 lb 2 oz (74.9 kg) IBW/kg (Calculated) : 54.7 Heparin Dosing Weight: 70 kg  Vital Signs: Temp: 98.1 F (36.7 C) (03/01 1325) Temp src: Oral (03/01 1325) BP: 109/52 mmHg (03/01 1325) Pulse Rate: 67 (03/01 1325)  Labs:  Recent Labs  04/10/13 0125 04/10/13 0734 04/10/13 0735 04/10/13 1443  HGB 9.6* 9.4*  --   --   HCT 29.9* 28.8*  --   --   PLT 159 136*  --   --   LABPROT 17.2* 18.8*  --   --   INR 1.44 1.62*  --   --   HEPARINUNFRC  --   --   --  0.32  CREATININE 7.53* 8.02*  --   --   TROPONINI <0.30  --  <0.30  --     Estimated Creatinine Clearance: 7.6 ml/min (by C-G formula based on Cr of 8.02).   Medications:  Scheduled:  . aspirin EC  81 mg Oral Daily  . budesonide-formoterol  2 puff Inhalation BID  . calcium carbonate  1 tablet Oral Daily  . cinacalcet  30 mg Oral Q breakfast  . docusate sodium  100 mg Oral Daily  . DULoxetine  60 mg Oral QHS  . ferrous [id number] C-folic acid  1 capsule Oral TID PC  . gabapentin  300 mg Oral QHS  . insulin aspart  0-9 Units Subcutaneous TID WC  . levothyroxine  150 mcg Oral QAC breakfast  . LORazepam  0.5 mg Oral QHS  . metoprolol tartrate  37.5 mg Oral BID  . multivitamin with minerals  1 tablet Oral Daily  . nicotine  7 mg Transdermal Daily  . pantoprazole  40 mg Oral Daily  . senna  1 tablet Oral QPM  . tacrolimus  5 mg Oral BID  . warfarin  4 mg Oral ONCE-1800  . Warfarin - Pharmacist Dosing Inpatient   Does not apply q1800   Infusions:  . heparin 1,000 Units/hr (04/10/13 1062)    Assessment: 58 yo female with AVR is currently on therapeutic heparin.  Heparin level is 0.32 Goal of Therapy: ; Heparin level 0.3-0.7  units/ml Monitor platelets by anticoagulation protocol: Yes   Plan:  1) Continue heparin at 1000 units/hr 2) Redraw heparin level in 8hrs to confirm  America Sandall, Tsz-Yin 04/10/2013,3:52 PM

## 2013-04-10 NOTE — H&P (Addendum)
History and Physical  Patient ID: [name] MRN: [id number], DOB: 02/21/1955 Date of Encounter: 04/10/2013, 2:59 AM Primary Physician: Charlynn Court, NP Primary Cardiologist: Shelva Majestic, MD  Chief Complaint: Chest pain Reason for Admission: Chest pain  HPI: [name] is a 58 y/o woman with CAD s/p recent single-vessel CABG (LIMA -> LAD) and AVR (03/15/13) by Dr. Prescott Gum, ESRD, ESLD s/p liver transplant (2011), DM, HTN, HLD, anxiety, and tobacco use, who present for further evaluation of chest pain and palpitations.  She has had mild chest wall soreness since being d/c'ed on 03/28/13 to SNF for rehab.  However, yesterday at ~1 PM, she noticed progressive worsening of the chest pain.  She describes it as a non-radiating dull ache in the center of her chest.  She also developed palpitations at that time.  She endorses shortness of breath as well.  She mentioned her discomfort to a nurse later in the afternoon; the nurse noted the patient to be quite tachycardic (HR ~150 bpm) and contact the patient's cardiac surgeon.  They were instructed to attempt vagal maneuvers, which did not alleviate the patient's symptoms.  EMS was subsequently summoned and found the patient to be tachycardic with a heart rate of 165 bpm.  Upon standing to get to the stretcher, the patient's palpitations resolved and she was found to have no normal heart rate in the 70's.  The patient reports similar episodes prior to her AVR and CABG, with them typically occuring at or immediately after hemodialysis.  At this time, the patient reports only mild chest soreness, which is at her baseline since CABG.  The patient denies lightheadedness, syncope, edema, and PND.  She has stable 2-pillow orthopnea since leaving the hospital.  She reports being compliant with all of her medications, though her INR's have been labile.  She denies significant bleeding.  The patient states that she was told at one point that she may need to wear an  ambulatory event monitor.  However, she does not believe that this has been done.  Past Medical History  Diagnosis Date  . 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 Chronic back pain   . CAD (coronary artery disease)   . Obesity   . Peripheral vascular disease hands and legs  . Anxiety   . Asthma   . GERD (gastroesophageal reflux disease)     takes Omeprazole daily  . Chronic constipation     takes MIralax and Colace daily  . Anemia     takes Folic Acid daily  . Hypothyroidism     takes Synthroid daily  . Depression     takes Cymbalta for "severe" depression  . Neuromuscular disorder     carpal tunnel in right hand  . Hypertension     takes Metoprolol and Lisinopril daily, sees Dr Bea Graff  . COPD (chronic obstructive pulmonary disease)   . Pneumonia     "today and several times before" (08/30/2012)  . Chronic bronchitis     "q yr w/season changes" (08/30/2012)  . Type II diabetes mellitus     Levemir 2units daily if > 150  . History of blood transfusion     "several" (08/30/2012)  . Hepatitis C   . Migraine     "last migraine was in 2013" (08/30/2012)  . Headache     "at least monthly" (08/30/2012)  . Arthritis     "  left hand, back" (08/30/2012)  . Ryleigh Esqueda stage renal disease on dialysis 02/27/2011    Kidneys shut down at time of liver transplant in Sept 2011 at [facility] in [place], she has been on HD ever since.  Dialyzes at [facility] HD on TTS schedule.  Had L forearm graft used 10 months then removed Dec 2012 due to suspected infection.  A right upper arm AVG was placed Dec 2012 but she developed steal symptoms acutely and it was ligated the same day.  Never had an AV fistula due to small veins.  Now has L thigh AVG put in Jan 2013, has not clotted to date.    [name] CAD (coronary artery disease) Jan. 2015    Cath: 20% LAD, 50% D1     Most Recent Cardiac Studies: TTE (03/09/13): Mildly reduced LVEF (45-50%) with  inferior hypokinesis.  LVH with elevated filling pressures noted.  Moderate AS present, which became severe with dobutamine infusion (AVA 0.77 sqcm, Vmax 2.8 m/s, mean grad 49 mmhg); MAC, mildly dilated left atrium.  LHC (03/10/13): Mild LAD disease with 60% ostial D1.  Normal LCx and dominant RCA.  LVgram with LVH and LVEF 50%.  AVA (0.8-1.2 sqcm), peak-to-peak gradient 52mmHg.  RHC (03/10/13): RA 7, RV 35/7, PA 35/17, PW 15, Fick CO/CI 5.4/3.0, thermal CO/CI: 3.7/2.0   Surgical History:  Past Surgical History  Procedure Laterality Date  . Liver transplant  10/25/2009    sees Dr Ferol Luz 1 every 6 months, saw last in Dec 2013. [name] Coord [phone]  . Small intestine surgery  90's  . Thrombectomy    . Arteriovenous graft placement Left 10/03/10     forearm  . Avgg removal  12/23/2010    Procedure: REMOVAL OF ARTERIOVENOUS GORETEX GRAFT ([place]);  Surgeon: Elam Dutch, MD;  Location: [place];  Service: Vascular;  Laterality: Left;  procedure started @1736 -1852  . Insertion of dialysis catheter  12/23/2010    Procedure: INSERTION OF DIALYSIS CATHETER;  Surgeon: Elam Dutch, MD;  Location: [place];  Service: Vascular;  Laterality: Right;  Right Internal Jugular 28cm dialysis catheter insertion procedure time 1701-1720   . Cholecystectomy  1993  . Cystoscopy  1990's  . Spinal growth rods  2010    "put 2 metal rods in my back; they had detetriorated" (08/30/2012)  . Av fistula placement  01/29/2011    Procedure: INSERTION OF ARTERIOVENOUS (AV) GORE-TEX GRAFT ARM;  Surgeon: Elam Dutch, MD;  Location: [place];  Service: Vascular;  Laterality: Right;  . Av fistula placement  03/10/2011    Procedure: INSERTION OF ARTERIOVENOUS (AV) GORE-TEX GRAFT THIGH;  Surgeon: Elam Dutch, MD;  Location: [place];  Service: Vascular;  Laterality: Left;  . Tubal ligation  1990's  . Cardiac catheterization  2014  . Aortic valve replacement N/A 03/15/2013    Procedure: AORTIC VALVE REPLACEMENT  (AVR);  Surgeon: Ivin Poot, MD;  Location: [place];  Service: Open Heart Surgery;  Laterality: N/A;  . Coronary artery bypass graft N/A 03/15/2013    Procedure: CORONARY ARTERY BYPASS GRAFTING (CABG) times one using left internal mammary artery.;  Surgeon: Ivin Poot, MD;  Location: [place];  Service: Open Heart Surgery;  Laterality: N/A;  POSS CABG X 1  . Intraoperative transesophageal echocardiogram N/A 03/15/2013    Procedure: INTRAOPERATIVE TRANSESOPHAGEAL ECHOCARDIOGRAM;  Surgeon: Ivin Poot, MD;  Location: [place];  Service: Open Heart Surgery;  Laterality: N/A;     Home Meds: Prior to Admission  medications   Medication Sig Start Date Emmalynne Courtney Date Taking? Authorizing Provider  albuterol (PROVENTIL,VENTOLIN) 90 MCG/ACT inhaler Inhale 1-2 puffs into the lungs every 6 (six) hours as needed for wheezing or shortness of breath.    Yes Historical Provider, MD  aspirin EC 81 MG tablet Take 81 mg by mouth daily.     Yes Historical Provider, MD  budesonide-formoterol (SYMBICORT) 160-4.5 MCG/ACT inhaler Inhale 2 puffs into the lungs 2 (two) times daily. 03/29/13  Yes Coolidge Breeze, PA-C  calcium carbonate (TUMS - DOSED IN MG ELEMENTAL CALCIUM) 500 MG chewable tablet Chew 1 tablet by mouth daily.   Yes Historical Provider, MD  cinacalcet (SENSIPAR) 30 MG tablet Take 30 mg by mouth daily.   Yes Historical Provider, MD  docusate sodium (COLACE) 100 MG capsule Take 100 mg by mouth daily.    Yes Historical Provider, MD  DULoxetine (CYMBALTA) 60 MG capsule Take 60 mg by mouth at bedtime.    Yes Historical Provider, MD  ferrous [id number] C-folic acid (TRINSICON / FOLTRIN) capsule Take 1 capsule by mouth 3 (three) times daily after meals. 03/29/13  Yes Coolidge Breeze, PA-C  gabapentin (NEURONTIN) 300 MG capsule Take 300 mg by mouth at bedtime.    Yes Historical Provider, MD  insulin detemir (LEVEMIR) 100 UNIT/ML injection Inject 2 Units into the skin at bedtime as needed (for CBG greater than  150).   Yes Historical Provider, MD  levothyroxine (SYNTHROID, LEVOTHROID) 150 MCG tablet Take 150 mcg by mouth daily before breakfast.   Yes Historical Provider, MD  LORazepam (ATIVAN) 0.5 MG tablet Take 0.5 mg by mouth at bedtime.  02/08/13  Yes Historical Provider, MD  metoprolol tartrate (LOPRESSOR) 25 MG tablet Take 1 tablet (25 mg total) by mouth 2 (two) times daily. 02/23/13  Yes Estela Leonie Green, MD  Multiple Vitamin (MULTIVITAMIN WITH MINERALS) TABS tablet Take 1 tablet by mouth daily.   Yes Historical Provider, MD  nicotine (NICODERM CQ - DOSED IN MG/24 HR) 7 mg/24hr patch Place 7 mg onto the skin daily.   Yes Historical Provider, MD  omeprazole (PRILOSEC) 20 MG capsule Take 20 mg by mouth daily.   Yes Historical Provider, MD  senna (SENOKOT) 8.6 MG TABS tablet Take 1 tablet by mouth every evening.   Yes Historical Provider, MD  tacrolimus (PROGRAF) 1 MG capsule Take 5 mg by mouth 2 (two) times daily.   Yes Historical Provider, MD  UNABLE TO FIND Take 4 oz by mouth 2 (two) times daily. Med Name: Medpass-between meals   Yes Historical Provider, MD  warfarin (COUMADIN) 2 MG tablet Take 2 mg by mouth daily at 6 PM. Or as directed by [facility] 03/29/13  Yes Gina L Collins, PA-C  ibuprofen (ADVIL,MOTRIN) 200 MG tablet Take 1 tablet (200 mg total) by mouth every 6 (six) hours as needed for moderate pain. 03/29/13   Coolidge Breeze, PA-C  promethazine (PHENERGAN) 12.5 MG tablet Take 12.5 mg by mouth every 4 (four) hours as needed for nausea.  02/09/13   Historical Provider, MD    Allergies:  Allergies  Allergen Reactions  . Acetaminophen Other (See Comments)    Liver transplant recipient   . Codeine Itching    History   Social History  . Marital Status: Divorced    Spouse Name: N/A    Number of Children: N/A  . Years of Education: N/A   Occupational History  . Not on file.   Social History Main Topics  .  Smoking status: Current Every Day Smoker -- 0.75 packs/day for  40 years    Types: Cigarettes  . Smokeless tobacco: Never Used  . Alcohol Use: Yes     Comment: 08/30/2012 "last drink was at a wedding July, 2014; had a small glass of wine; never had problems w/alcohol"  . Drug Use: No  . Sexual Activity: Not Currently   Other Topics Concern  . Not on file   Social History Narrative  . No narrative on file     Family History  Problem Relation Age of Onset  . Cancer Mother   . Diabetes Mother   . Hypertension Mother   . Stroke Mother   . Cancer Father   . Anesthesia problems Neg Hx   . Hypotension Neg Hx   . Malignant hyperthermia Neg Hx   . Pseudochol deficiency Neg Hx     Review of Systems: Patient notes mild drainage from chest tube insertion sites.  Otherwise, a 10-system review of systems was negative except as noted in the HPI.  Labs:   Lab Results  Component Value Date   WBC 6.6 04/10/2013   HGB 9.6* 04/10/2013   HCT 29.9* 04/10/2013   MCV 93.7 04/10/2013   PLT 159 04/10/2013    Recent Labs Lab 04/10/13 0125  NA 141  K 3.4*  CL 96  CO2 25  BUN 34*  CREATININE 7.53*  CALCIUM 8.6  GLUCOSE 196*    Recent Labs  04/10/13 0125  TROPONINI <0.30   Lab Results  Component Value Date   CHOL 225* 12/22/2011   HDL 97 12/22/2011   LDLCALC 104* 12/22/2011   TRIG 121 12/22/2011   TSH (03/08/13): 1.182  Radiology/Studies:  Dg Chest 2 View  04/10/2013   CLINICAL DATA:  Chest pain, valve replacement  EXAM: CHEST  2 VIEW  COMPARISON:  03/26/2013  FINDINGS: Left basilar opacity, likely atelectasis. Probable trace right pleural effusion, decreased. No pneumothorax.  The heart is top-normal in size. Postsurgical changes related to prior CABG. Prosthetic aortic valve.  Cholecystectomy clips.  Mild degenerative changes of the visualized thoracolumbar spine. Lumbar spine fixation hardware, incompletely visualized.  IMPRESSION: Left basilar opacity, likely atelectasis.  Probable trace right pleural effusion, decreased.   Electronically Signed    By: Julian Hy M.D.   On: 04/10/2013 00:19    EKG: NSR with LVH and abnormal replarization, poor R-wave progression.  Biphasic T-wave in V2 is new since 03/16/13 but is non-specific.  Physical Exam: Blood pressure 106/50, pulse 74, temperature 98.9 F (37.2 C), temperature source Oral, resp. rate 22, height 5\' 4"  (1.626 m), weight 72.576 kg (160 lb), SpO2 97.00%. General: Well developed, well nourished woman, in no acute distress. Head: Normocephalic, atraumatic, sclera non-icteric, PERRL, nares are without discharge. Neck:  JVD not elevated.  HJR present.  Supple without LAD Lungs: Diminished breath sounds at right base.  No wheezes or crackles. Heart: RRR with S1 S2. Mechanic S2 click noted. No murmurs, rubs, or gallops appreciated. Abdomen: Soft, non-tender, non-distended with normoactive bowel sounds. No hepatomegaly. No rebound/guarding. No obvious abdominal masses. Msk:  Strength and tone appear normal for age.  Mild sternal tenderness, which reproduces chest pain that patient has been experiencing since hospital discharge. Extremities: No clubbing or cyanosis. No edema.  Distal pedal pulses are 2+ and equal bilaterally. Neuro: Alert and oriented X 3. No focal deficit. No facial asymmetry. Moves all extremities spontaneously. Psych:  Responds to questions appropriately with a normal affect. Derm: Stenotomy healing.  Epigastric chest tube sites covered with clean dressing.    ASSESSMENT AND PLAN:  58 y/o woman with history of CAD and severe AS s/p single-vessel CABG and mechanic AVR on 03/15/13, DM, HTN, HLD, ESRD on HD (MWF), and ESLD s/p liver transplant, whom we have been asked to evaluate due to chest pain and palpitations yesterday afternoon.  The patient has reportedly had similar episodes in the past.  Considerations include SVT, paroxysmal a-fib or VT, all of which could cause chest pain.  There is no clear precipitant of her presumed arrhythmia, though previous associations with  fluid shifts suggest that it could be related to fluid or electrolyte shifts.  Given recent surgery, PE is also a consideration, though patient has been anticoagulated (allbeit subtherapeutically) making this less likely.  She denies fevers/chills and has a normal WBC count.  Chest pain:  Most likely musculoskeletal from recent sternotomy with superimposed discomfort associated with transient tachycardia.  No EKG changes to suggest ongoing ischemia.  Post-pericariotomy pericarditis is a considertation, though the quality of the pain and it's worsening with tachycardia and some reproducibility with chest wall palpation make this less likely.  - Admit patient to floor bed with telemetry for monitoring, observation status  - Trend troponin x 3  - Repeat EKG if chest pain occurs  - Continue ASA 81 mg daily  - Patient would ideally be on statin, given h/o CAD, but will defer this given her history of liver transplant  - Echocardiogram  - Smoking abstinence encouraged.  Continue nicotine patch.  - Defer DVT/PE workup, as treatment not changed acutely (continue anticoagulation)  Palpitations:  This seems to be a recurrent problem for the patient.  - Monitor on telemetry  - Continue metoprolol 25 mg BID.  Consider uptitration as HR and BP allow.  - Consider discharging with ambulatory event monitor if arrhythmia is not captured during hospitalization.  AVR:  Patient without HF symptoms other than brief chest pain/shortness of breath in the setting of tachycardia.  - Initiate heparin infusion for subtherapeutic INR and redose warfarin with pharmacy assistance.  - Echocardiogram  ESRD:  - Day team to contact nephrology in the AM to continue routine HD schedule.  - Continue Sensipar and calcium carbonate.  ESLD:  - Check morning tacrolimus level.  - Continue home tacrolimus dosing  - Check LFT's with AM labs  DM:  - sliding scale insulin  Obstructive lung disease:  No symptoms to suggest acute  exacerbation.  - Continue Symbicort and prn albuterol MDI  Anemia: Likely chronic from renal disease with superimposed recent blood loss from CABG/AVR.  - Continue oral iron repletion.  Prophylaxis:  - Heparin infusion/warfarin.  - PPI  Diet:  - Heart health,, carb modified.  Code status: DNR/DNI.  Patient clearly states that she would not like any life-sustaining measures, including CPR and mechanical ventilation, given her multiple comorbidities.  Signed, Bernyce Brimley A. MD 04/10/2013, 2:59 AM

## 2013-04-11 DIAGNOSIS — I471 Supraventricular tachycardia: Principal | ICD-10-CM

## 2013-04-11 DIAGNOSIS — Z954 Presence of other heart-valve replacement: Secondary | ICD-10-CM

## 2013-04-11 LAB — COMPREHENSIVE METABOLIC PANEL
ALBUMIN: 2.5 g/dL — AB (ref 3.5–5.2)
ALT: 14 U/L (ref 0–35)
AST: 20 U/L (ref 0–37)
Alkaline Phosphatase: 95 U/L (ref 39–117)
BUN: 57 mg/dL — ABNORMAL HIGH (ref 6–23)
CO2: 23 mEq/L (ref 19–32)
CREATININE: 10.06 mg/dL — AB (ref 0.50–1.10)
Calcium: 8.1 mg/dL — ABNORMAL LOW (ref 8.4–10.5)
Chloride: 94 mEq/L — ABNORMAL LOW (ref 96–112)
GFR calc Af Amer: 4 mL/min — ABNORMAL LOW (ref >90)
GFR calc non Af Amer: 4 mL/min — ABNORMAL LOW (ref >90)
Glucose, Bld: 200 mg/dL — ABNORMAL HIGH (ref 70–99)
Potassium: 4.7 mEq/L (ref 3.7–5.3)
Sodium: 136 mEq/L — ABNORMAL LOW (ref 137–147)
TOTAL PROTEIN: 6.9 g/dL (ref 6.0–8.3)
Total Bilirubin: 0.3 mg/dL (ref 0.3–1.2)

## 2013-04-11 LAB — PHOSPHORUS: Phosphorus: 5 mg/dL — ABNORMAL HIGH (ref 2.3–4.6)

## 2013-04-11 LAB — CBC
HCT: 28.3 % — ABNORMAL LOW (ref 36.0–46.0)
HCT: 28.4 % — ABNORMAL LOW (ref 36.0–46.0)
Hemoglobin: 9.3 g/dL — ABNORMAL LOW (ref 12.0–15.0)
Hemoglobin: 9.5 g/dL — ABNORMAL LOW (ref 12.0–15.0)
MCH: 30.5 pg (ref 26.0–34.0)
MCH: 30.8 pg (ref 26.0–34.0)
MCHC: 32.9 g/dL (ref 30.0–36.0)
MCHC: 33.5 g/dL (ref 30.0–36.0)
MCV: 92.8 fL (ref 78.0–100.0)
MCV: 92.2 fL (ref 78.0–100.0)
PLATELETS: 201 10*3/uL (ref 150–400)
Platelets: 183 10*3/uL (ref 150–400)
RBC: 3.05 MIL/uL — ABNORMAL LOW (ref 3.87–5.11)
RBC: 3.08 MIL/uL — ABNORMAL LOW (ref 3.87–5.11)
RDW: 16 % — ABNORMAL HIGH (ref 11.5–15.5)
RDW: 16.1 % — AB (ref 11.5–15.5)
WBC: 7.3 10*3/uL (ref 4.0–10.5)
WBC: 7.3 10*3/uL (ref 4.0–10.5)

## 2013-04-11 LAB — GLUCOSE, CAPILLARY
GLUCOSE-CAPILLARY: 136 mg/dL — AB (ref 70–99)
GLUCOSE-CAPILLARY: 137 mg/dL — AB (ref 70–99)
Glucose-Capillary: 143 mg/dL — ABNORMAL HIGH (ref 70–99)

## 2013-04-11 LAB — HEPARIN LEVEL (UNFRACTIONATED)
HEPARIN UNFRACTIONATED: 0.49 [IU]/mL (ref 0.30–0.70)
HEPARIN UNFRACTIONATED: 0.51 [IU]/mL (ref 0.30–0.70)

## 2013-04-11 LAB — PROTIME-INR
INR: 1.77 — AB (ref 0.00–1.49)
PROTHROMBIN TIME: 20.1 s — AB (ref 11.6–15.2)

## 2013-04-11 MED ORDER — SODIUM CHLORIDE 0.9 % IV SOLN: 100.0000 mL | INTRAVENOUS | Status: DC | PRN

## 2013-04-11 MED ORDER — LIDOCAINE-PRILOCAINE 2.5-2.5 % EX CREA
1.0000 "application " | TOPICAL_CREAM | CUTANEOUS | Status: DC | PRN
  Filled 2013-04-11: qty 5

## 2013-04-11 MED ORDER — DOXERCALCIFEROL 4 MCG/2ML IV SOLN
4.0000 ug | INTRAVENOUS | Status: DC
  Administered 2013-04-13 – 2013-04-15 (×2): 4 ug via INTRAVENOUS
  Filled 2013-04-11 (×2): qty 2

## 2013-04-11 MED ORDER — PENTAFLUOROPROP-TETRAFLUOROETH EX AERO: 1.0000 "application " | INHALATION_SPRAY | CUTANEOUS | Status: DC | PRN

## 2013-04-11 MED ORDER — LIDOCAINE HCL (PF) 1 % IJ SOLN: 5.0000 mL | INTRAMUSCULAR | Status: DC | PRN

## 2013-04-11 MED ORDER — ALTEPLASE 2 MG IJ SOLR
2.0000 mg | Freq: Once | INTRAMUSCULAR | Status: AC | PRN
  Filled 2013-04-11: qty 2

## 2013-04-11 MED ORDER — NEPRO/CARBSTEADY PO LIQD
237.0000 mL | ORAL | Status: DC | PRN
  Filled 2013-04-11: qty 237

## 2013-04-11 MED ORDER — DARBEPOETIN ALFA-POLYSORBATE 100 MCG/0.5ML IJ SOLN
100.0000 ug | INTRAMUSCULAR | Status: DC
  Administered 2013-04-11: 100 ug via INTRAVENOUS

## 2013-04-11 MED ORDER — WARFARIN SODIUM 4 MG PO TABS
4.0000 mg | ORAL_TABLET | Freq: Once | ORAL | Status: AC
  Administered 2013-04-11: 4 mg via ORAL
  Filled 2013-04-11: qty 1

## 2013-04-11 MED ORDER — HEPARIN SODIUM (PORCINE) 1000 UNIT/ML DIALYSIS: 40.0000 [IU]/kg | Freq: Once | INTRAMUSCULAR | Status: DC

## 2013-04-11 MED ORDER — DARBEPOETIN ALFA-POLYSORBATE 100 MCG/0.5ML IJ SOLN
INTRAMUSCULAR | Status: AC
  Filled 2013-04-11: qty 0.5

## 2013-04-11 MED ORDER — HEPARIN SODIUM (PORCINE) 1000 UNIT/ML DIALYSIS: 1000.0000 [IU] | INTRAMUSCULAR | Status: DC | PRN

## 2013-04-11 MED ORDER — RENA-VITE PO TABS
1.0000 | ORAL_TABLET | Freq: Every day | ORAL | Status: DC
  Administered 2013-04-12 – 2013-04-14 (×3): 1 via ORAL
  Filled 2013-04-11 (×5): qty 1

## 2013-04-11 NOTE — Progress Notes (Signed)
Subjective: W/o complaints. Denies further CP and palpitations.   Objective: Vital signs in last 24 hours: Temp:  [97.7 F (36.5 C)-98.4 F (36.9 C)] 97.7 F (36.5 C) (03/02 0541) Pulse Rate:  [67-77] 71 (03/02 0541) Resp:  [18] 18 (03/02 0541) BP: (109-132)/(47-52) 122/47 mmHg (03/02 0541) SpO2:  [99 %-100 %] 100 % (03/02 0541) Weight:  [165 lb 5.5 oz (75 kg)] 165 lb 5.5 oz (75 kg) (03/02 0541) Last BM Date: 04/09/13  Intake/Output from previous day: 03/01 0701 - 03/02 0700 In: 840 [P.O.:840] Out: -  Intake/Output this shift:    Medications Current Facility-Administered Medications  Medication Dose Route Frequency Provider Last Rate Last Dose  . albuterol (PROVENTIL) (2.5 MG/3ML) 0.083% nebulizer solution 2.5 mg  2.5 mg Nebulization Q6H PRN Arnoldo Lenis, MD      . aspirin EC tablet 81 mg  81 mg Oral Daily Glena Norfolk End, MD   81 mg at 04/10/13 1032  . budesonide-formoterol (SYMBICORT) 160-4.5 MCG/ACT inhaler 2 puff  2 puff Inhalation BID Hetty Blend, MD   2 puff at 04/10/13 2059  . calcium carbonate (TUMS - dosed in mg elemental calcium) chewable tablet 200 mg of elemental calcium  1 tablet Oral Daily Christopher A. End, MD   200 mg of elemental calcium at 04/10/13 1032  . cinacalcet (SENSIPAR) tablet 30 mg  30 mg Oral Q breakfast Glena Norfolk End, MD   30 mg at 04/11/13 0819  . docusate sodium (COLACE) capsule 100 mg  100 mg Oral Daily Glena Norfolk End, MD   100 mg at 04/10/13 1033  . DULoxetine (CYMBALTA) DR capsule 60 mg  60 mg Oral QHS Glena Norfolk End, MD   60 mg at 04/10/13 2209  . ferrous [id number] C-folic acid (TRINSICON / FOLTRIN) capsule 1 capsule  1 capsule Oral TID PC Glena Norfolk End, MD   1 capsule at 04/11/13 0819  . gabapentin (NEURONTIN) capsule 300 mg  300 mg Oral QHS Christopher A. End, MD   300 mg at 04/10/13 2209  . heparin ADULT infusion 100 units/mL (25000 units/250 mL)  1,000 Units/hr Intravenous Continuous Rogue Bussing, RPH 10 mL/hr at 04/11/13 0838 1,000 Units/hr at 04/11/13 7782  . insulin aspart (novoLOG) injection 0-9 Units  0-9 Units Subcutaneous TID WC Hetty Blend, MD   1 Units at 04/11/13 [phone]  . levothyroxine (SYNTHROID, LEVOTHROID) tablet 150 mcg  150 mcg Oral QAC breakfast Glena Norfolk End, MD   150 mcg at 04/11/13 0819  . LORazepam (ATIVAN) tablet 0.5 mg  0.5 mg Oral QHS Glena Norfolk End, MD   0.5 mg at 04/10/13 2209  . metoprolol tartrate (LOPRESSOR) tablet 37.5 mg  37.5 mg Oral BID Arnoldo Lenis, MD   37.5 mg at 04/10/13 2208  . multivitamin with minerals tablet 1 tablet  1 tablet Oral Daily Glena Norfolk End, MD   1 tablet at 04/10/13 1034  . nicotine (NICODERM CQ - dosed in mg/24 hr) patch 7 mg  7 mg Transdermal Daily Glena Norfolk End, MD   7 mg at 04/10/13 1020  . nitroGLYCERIN (NITROSTAT) SL tablet 0.4 mg  0.4 mg Sublingual Q5 Min x 3 PRN Glena Norfolk End, MD      . pantoprazole (PROTONIX) EC tablet 40 mg  40 mg Oral Daily Glena Norfolk End, MD   40 mg at 04/10/13 1040  . senna (SENOKOT) tablet 8.6 mg  1 tablet Oral QPM Hetty Blend, MD   8.6  mg at 04/10/13 1818  . tacrolimus (PROGRAF) capsule 5 mg  5 mg Oral BID Glena Norfolk End, MD   5 mg at 04/10/13 2209  . Warfarin - Pharmacist Dosing Inpatient   Does not apply q1800 Rogue Bussing, [facility]        PE: General appearance: alert, cooperative and no distress Lungs: clear to auscultation bilaterally Heart: regular rate and rhythm and 2/6 SM, audible mechanical valve sounds Extremities: no LEE Pulses: 2+ and symmetric Skin: warm and dry Neurologic: Grossly normal  Lab Results:   Recent Labs  04/10/13 0125 04/10/13 0734 04/11/13 0456  WBC 6.6 6.6 7.3  HGB 9.6* 9.4* 9.3*  HCT 29.9* 28.8* 28.3*  PLT 159 136* 183   BMET  Recent Labs  04/10/13 0125 04/10/13 0734  NA 141 143  K 3.4* 3.7  CL 96 98  CO2 25 28  GLUCOSE 196* 158*  BUN 34* 37*  CREATININE 7.53* 8.02*  CALCIUM 8.6 8.7     PT/INR  Recent Labs  04/10/13 0125 04/10/13 0734 04/11/13 0456  LABPROT 17.2* 18.8* 20.1*  INR 1.44 1.62* 1.77*   Cholesterol No results found for this basename: CHOL,  in the last 72 hours Cardiac Panel (last 3 results)  Recent Labs  04/10/13 0735 04/10/13 1403 04/10/13 1958  TROPONINI <0.30 <0.30 <0.30    Assessment/Plan    Principal Problem:   Chest pain- with tachycardia Active Problems:   Palpitations- run of tachycardia after HD, lasted 15-20 minutes   58 yo female hx of CAD with single vessel CABG 02/2013, severe AS s/p mechanical AVR 02/2013, DM, HTN, ESRD, ESLD s/p transplant admitted with chest pain and palpitations.   Plan:  Pt denies any further CP or palpitations. Enzymes negative x 3. NSR on telemetry. No arrhthymias or tachycardia detected on telemetry since admission. HR in the 80s. BP is stable. Continue on 37.5 mg of Lopressor BID. INR remains sub therapeutic at 1.77. Goal for mechanical aortic valve is 2.5-3.5. Continue with heparin bridge. MD to follow.     LOS: 2 days    Brittainy M. Ladoris Gene 04/11/2013 8:54 AM   Patient seen and examined. Agree with assessment and plan. Feels better. HR now in the 80's on increased lopressor dose which is not yet at steady state. Aim for INR 2.5-3 with AVR. Troponins are negative.   Troy Sine, MD, [facility] 04/11/2013 10:53 AM

## 2013-04-11 NOTE — Progress Notes (Signed)
ANTICOAGULATION CONSULT NOTE - Follow Up Consult  Pharmacy Consult for heparin Indication: AVR  Labs:  Recent Labs  04/10/13 0125 04/10/13 0734 04/10/13 0735 04/10/13 1403 04/10/13 1443 04/10/13 1958 04/10/13 2313  HGB 9.6* 9.4*  --   --   --   --   --   HCT 29.9* 28.8*  --   --   --   --   --   PLT 159 136*  --   --   --   --   --   LABPROT 17.2* 18.8*  --   --   --   --   --   INR 1.44 1.62*  --   --   --   --   --   HEPARINUNFRC  --   --   --   --  0.32  --  0.49  CREATININE 7.53* 8.02*  --   --   --   --   --   TROPONINI <0.30  --  <0.30 <0.30  --  <0.30  --     Assessment/Plan:  58yo female remains therapeutic on heparin.  Will continue gtt at current rate and monitor levels.  Wynona Neat, PharmD, BCPS  04/11/2013,12:14 AM

## 2013-04-11 NOTE — Consult Note (Signed)
Reason for Consult:ESRD, Anemia, HPTH,  Referring Physician: Dr. [name] is an 58 y.o. female.  HPI: 58 yr old female admitted early am 3/1 with CP assoc with ^^HR.  Resolved spont.  Called this eve as primary had not called to notify she was here and has MWF  HD schedule (was on TTS until recent hosp for AVR and LIMA).  Now on HD @ [place] , was @ [place].  Hx ESRD post liver TX 2011.  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  Not SOB now but concerned about fluid and K.  Hx of L thigh AVG after 2 access failed in UE due to steals. Constitutional: weak and CP Sat pm Eyes: negative Ears, nose, mouth, throat, and face: negative Respiratory: no cough , wheezing or sputum Cardiovascular: see HPI.  sore in Chest since surgery and drainage at MT tube sites, pain with deep breath Gastrointestinal: negative Genitourinary:little to no urine Integument/breast: negative Hematologic/lymphatic: anemia Musculoskeletal:negative Neurological: negative Endocrine: bs controlled Allergic/Immunologic: NKA   Dialyzes at [facility] on MWF since @/15. Primary Nephrologist Colodonato. EDW 72kg. HD Bath 2K, 2 Ca, Dialyzer 190NR, Heparin Strd. Access L thigh AVG.  Past Medical History  Diagnosis Date  . 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 Chronic back pain   . CAD (coronary artery disease)   . Obesity   . Peripheral vascular disease hands and legs  . Anxiety   . Asthma   . GERD (gastroesophageal reflux disease)     takes Omeprazole daily  . Chronic constipation     takes MIralax and Colace daily  . Anemia     takes Folic Acid daily  . Hypothyroidism     takes Synthroid daily  . Depression     takes Cymbalta for "severe" depression  . Neuromuscular disorder     carpal tunnel in right hand  . Hypertension     takes Metoprolol and  Lisinopril daily, sees Dr Bea Graff  . COPD (chronic obstructive pulmonary disease)   . Pneumonia     "today and several times before" (08/30/2012)  . Chronic bronchitis     "q yr w/season changes" (08/30/2012)  . Type II diabetes mellitus     Levemir 2units daily if > 150  . History of blood transfusion     "several" (08/30/2012)  . Hepatitis C   . Migraine     "last migraine was in 2013" (08/30/2012)  . Headache     "at least monthly" (08/30/2012)  . Arthritis     "left hand, back" (08/30/2012)  . End stage renal disease on dialysis 02/27/2011    Kidneys shut down at time of liver transplant in Sept 2011 at [facility] in [place], she has been on HD ever since.  Dialyzes at [facility] HD on TTS schedule.  Had L forearm graft used 10 months then removed Dec 2012 due to suspected infection.  A right upper arm AVG was placed Dec 2012 but she developed steal symptoms acutely and it was ligated the same day.  Never had an AV fistula due to small veins.  Now has L thigh AVG put in Jan 2013, has not clotted to date.    [name] CAD (coronary artery disease) Jan. 2015    Cath: 20% LAD, 50% D1  Past Surgical History  Procedure Laterality Date  . Liver transplant  10/25/2009    sees Dr Ferol Luz 1 every 6 months, saw last in Dec 2013. [name] Coord [phone]  . Small intestine surgery  90's  . Thrombectomy    . Arteriovenous graft placement Left 10/03/10     forearm  . Avgg removal  12/23/2010    Procedure: REMOVAL OF ARTERIOVENOUS GORETEX GRAFT ([place]);  Surgeon: Elam Dutch, MD;  Location: [place];  Service: Vascular;  Laterality: Left;  procedure started _0 -1852  . Insertion of dialysis catheter  12/23/2010    Procedure: INSERTION OF DIALYSIS CATHETER;  Surgeon: Elam Dutch, MD;  Location: [place];  Service: Vascular;  Laterality: Right;  Right Internal Jugular 28cm dialysis catheter insertion procedure time 1701-1720   . Cholecystectomy  1993  . Cystoscopy  1990's  . Spinal growth rods   2010    "put 2 metal rods in my back; they had detetriorated" (08/30/2012)  . Av fistula placement  01/29/2011    Procedure: INSERTION OF ARTERIOVENOUS (AV) GORE-TEX GRAFT ARM;  Surgeon: Elam Dutch, MD;  Location: [place];  Service: Vascular;  Laterality: Right;  . Av fistula placement  03/10/2011    Procedure: INSERTION OF ARTERIOVENOUS (AV) GORE-TEX GRAFT THIGH;  Surgeon: Elam Dutch, MD;  Location: [place];  Service: Vascular;  Laterality: Left;  . Tubal ligation  1990's  . Cardiac catheterization  2014  . Aortic valve replacement N/A 03/15/2013    Procedure: AORTIC VALVE REPLACEMENT (AVR);  Surgeon: Ivin Poot, MD;  Location: [place];  Service: Open Heart Surgery;  Laterality: N/A;  . Coronary artery bypass graft N/A 03/15/2013    Procedure: CORONARY ARTERY BYPASS GRAFTING (CABG) times one using left internal mammary artery.;  Surgeon: Ivin Poot, MD;  Location: [place];  Service: Open Heart Surgery;  Laterality: N/A;  POSS CABG X 1  . Intraoperative transesophageal echocardiogram N/A 03/15/2013    Procedure: INTRAOPERATIVE TRANSESOPHAGEAL ECHOCARDIOGRAM;  Surgeon: Ivin Poot, MD;  Location: [place];  Service: Open Heart Surgery;  Laterality: N/A;    Family History  Problem Relation Age of Onset  . Cancer Mother   . Diabetes Mother   . Hypertension Mother   . Stroke Mother   . Cancer Father   . Anesthesia problems Neg Hx   . Hypotension Neg Hx   . Malignant hyperthermia Neg Hx   . Pseudochol deficiency Neg Hx     Social History:  reports that she quit smoking about 4 weeks ago. Her smoking use included Cigarettes. She has a 30 pack-year smoking history. She has never used smokeless tobacco. She reports that she drinks alcohol. She reports that she does not use illicit drugs.  Allergies:  Allergies  Allergen Reactions  . Acetaminophen Other (See Comments)    Liver transplant recipient   . Codeine Itching    Medications:  I have reviewed the patient's current  medications. Prior to Admission:   , Hectorol 24mg IV with HD,. Epogen 1000 Units iv with HD,.   Results for orders placed during the hospital encounter of 04/09/13 (from the past 48 hour(s))  CBC WITH DIFFERENTIAL     Status: Abnormal   Collection Time    04/10/13  1:25 AM      Result Value Ref Range   WBC 6.6  4.0 - 10.5 K/uL   RBC 3.19 (*) 3.87 - 5.11 MIL/uL   Hemoglobin 9.6 (*) 12.0 - 15.0 g/dL  HCT 29.9 (*) 36.0 - 46.0 %   MCV 93.7  78.0 - 100.0 fL   MCH 30.1  26.0 - 34.0 pg   MCHC 32.1  30.0 - 36.0 g/dL   RDW 15.9 (*) 11.5 - 15.5 %   Platelets 159  150 - 400 K/uL   Neutrophils Relative % 53  43 - 77 %   Neutro Abs 3.5  1.7 - 7.7 K/uL   Lymphocytes Relative 32  12 - 46 %   Lymphs Abs 2.1  0.7 - 4.0 K/uL   Monocytes Relative 8  3 - 12 %   Monocytes Absolute 0.6  0.1 - 1.0 K/uL   Eosinophils Relative 7 (*) 0 - 5 %   Eosinophils Absolute 0.4  0.0 - 0.7 K/uL   Basophils Relative 1  0 - 1 %   Basophils Absolute 0.0  0.0 - 0.1 K/uL  BASIC METABOLIC PANEL     Status: Abnormal   Collection Time    04/10/13  1:25 AM      Result Value Ref Range   Sodium 141  137 - 147 mEq/L   Potassium 3.4 (*) 3.7 - 5.3 mEq/L   Chloride 96  96 - 112 mEq/L   CO2 25  19 - 32 mEq/L   Glucose, Bld 196 (*) 70 - 99 mg/dL   BUN 34 (*) 6 - 23 mg/dL   Creatinine, Ser 7.53 (*) 0.50 - 1.10 mg/dL   Calcium 8.6  8.4 - 10.5 mg/dL   GFR calc non Af Amer 5 (*) >90 mL/min   GFR calc Af Amer 6 (*) >90 mL/min   Comment: (NOTE)     The eGFR has been calculated using the CKD EPI equation.     This calculation has not been validated in all clinical situations.     eGFR's persistently <90 mL/min signify possible Chronic Kidney     Disease.  TROPONIN I     Status: None   Collection Time    04/10/13  1:25 AM      Result Value Ref Range   Troponin I <0.30  <0.30 ng/mL   Comment:            Due to the release kinetics of cTnI,     a negative result within the first hours     of the onset of symptoms does  not rule out     myocardial infarction with certainty.     If myocardial infarction is still suspected,     repeat the test at appropriate intervals.  PROTIME-INR     Status: Abnormal   Collection Time    04/10/13  1:25 AM      Result Value Ref Range   Prothrombin Time 17.2 (*) 11.6 - 15.2 seconds   INR 1.44  0.00 - 1.49  GLUCOSE, CAPILLARY     Status: Abnormal   Collection Time    04/10/13  5:42 AM      Result Value Ref Range   Glucose-Capillary 124 (*) 70 - 99 mg/dL   Comment 1 Notify RN    COMPREHENSIVE METABOLIC PANEL     Status: Abnormal   Collection Time    04/10/13  7:34 AM      Result Value Ref Range   Sodium 143  137 - 147 mEq/L   Potassium 3.7  3.7 - 5.3 mEq/L   Chloride 98  96 - 112 mEq/L   CO2 28  19 - 32 mEq/L   Glucose, Bld  158 (*) 70 - 99 mg/dL   BUN 37 (*) 6 - 23 mg/dL   Creatinine, Ser 8.02 (*) 0.50 - 1.10 mg/dL   Calcium 8.7  8.4 - 10.5 mg/dL   Total Protein 6.7  6.0 - 8.3 g/dL   Albumin 2.6 (*) 3.5 - 5.2 g/dL   AST 23  0 - 37 U/L   ALT 14  0 - 35 U/L   Alkaline Phosphatase 95  39 - 117 U/L   Total Bilirubin 0.3  0.3 - 1.2 mg/dL   GFR calc non Af Amer 5 (*) >90 mL/min   GFR calc Af Amer 6 (*) >90 mL/min   Comment: (NOTE)     The eGFR has been calculated using the CKD EPI equation.     This calculation has not been validated in all clinical situations.     eGFR's persistently <90 mL/min signify possible Chronic Kidney     Disease.  PROTIME-INR     Status: Abnormal   Collection Time    04/10/13  7:34 AM      Result Value Ref Range   Prothrombin Time 18.8 (*) 11.6 - 15.2 seconds   INR 1.62 (*) 0.00 - 1.49  MAGNESIUM     Status: None   Collection Time    04/10/13  7:34 AM      Result Value Ref Range   Magnesium 2.1  1.5 - 2.5 mg/dL  CBC     Status: Abnormal   Collection Time    04/10/13  7:34 AM      Result Value Ref Range   WBC 6.6  4.0 - 10.5 K/uL   RBC 3.06 (*) 3.87 - 5.11 MIL/uL   Hemoglobin 9.4 (*) 12.0 - 15.0 g/dL   HCT 28.8 (*) 36.0 -  46.0 %   MCV 94.1  78.0 - 100.0 fL   MCH 30.7  26.0 - 34.0 pg   MCHC 32.6  30.0 - 36.0 g/dL   RDW 16.1 (*) 11.5 - 15.5 %   Platelets 136 (*) 150 - 400 K/uL  TROPONIN I     Status: None   Collection Time    04/10/13  7:35 AM      Result Value Ref Range   Troponin I <0.30  <0.30 ng/mL   Comment:            Due to the release kinetics of cTnI,     a negative result within the first hours     of the onset of symptoms does not rule out     myocardial infarction with certainty.     If myocardial infarction is still suspected,     repeat the test at appropriate intervals.  GLUCOSE, CAPILLARY     Status: Abnormal   Collection Time    04/10/13  7:50 AM      Result Value Ref Range   Glucose-Capillary 150 (*) 70 - 99 mg/dL   Comment 1 Notify RN    GLUCOSE, CAPILLARY     Status: Abnormal   Collection Time    04/10/13 11:31 AM      Result Value Ref Range   Glucose-Capillary 119 (*) 70 - 99 mg/dL   Comment 1 Notify RN    TROPONIN I     Status: None   Collection Time    04/10/13  2:03 PM      Result Value Ref Range   Troponin I <0.30  <0.30 ng/mL   Comment:  Due to the release kinetics of cTnI,     a negative result within the first hours     of the onset of symptoms does not rule out     myocardial infarction with certainty.     If myocardial infarction is still suspected,     repeat the test at appropriate intervals.  HEPARIN LEVEL (UNFRACTIONATED)     Status: None   Collection Time    04/10/13  2:43 PM      Result Value Ref Range   Heparin Unfractionated 0.32  0.30 - 0.70 IU/mL   Comment:            IF HEPARIN RESULTS ARE BELOW     EXPECTED VALUES, AND PATIENT     DOSAGE HAS BEEN CONFIRMED,     SUGGEST FOLLOW UP TESTING     OF ANTITHROMBIN III LEVELS.  GLUCOSE, CAPILLARY     Status: Abnormal   Collection Time    04/10/13  5:17 PM      Result Value Ref Range   Glucose-Capillary 134 (*) 70 - 99 mg/dL   Comment 1 Notify RN    TROPONIN I     Status: None    Collection Time    04/10/13  7:58 PM      Result Value Ref Range   Troponin I <0.30  <0.30 ng/mL   Comment:            Due to the release kinetics of cTnI,     a negative result within the first hours     of the onset of symptoms does not rule out     myocardial infarction with certainty.     If myocardial infarction is still suspected,     repeat the test at appropriate intervals.  GLUCOSE, CAPILLARY     Status: Abnormal   Collection Time    04/10/13 10:16 PM      Result Value Ref Range   Glucose-Capillary 146 (*) 70 - 99 mg/dL  HEPARIN LEVEL (UNFRACTIONATED)     Status: None   Collection Time    04/10/13 11:13 PM      Result Value Ref Range   Heparin Unfractionated 0.49  0.30 - 0.70 IU/mL   Comment:            IF HEPARIN RESULTS ARE BELOW     EXPECTED VALUES, AND PATIENT     DOSAGE HAS BEEN CONFIRMED,     SUGGEST FOLLOW UP TESTING     OF ANTITHROMBIN III LEVELS.  HEPARIN LEVEL (UNFRACTIONATED)     Status: None   Collection Time    04/11/13  4:56 AM      Result Value Ref Range   Heparin Unfractionated 0.51  0.30 - 0.70 IU/mL   Comment:            IF HEPARIN RESULTS ARE BELOW     EXPECTED VALUES, AND PATIENT     DOSAGE HAS BEEN CONFIRMED,     SUGGEST FOLLOW UP TESTING     OF ANTITHROMBIN III LEVELS.  CBC     Status: Abnormal   Collection Time    04/11/13  4:56 AM      Result Value Ref Range   WBC 7.3  4.0 - 10.5 K/uL   RBC 3.05 (*) 3.87 - 5.11 MIL/uL   Hemoglobin 9.3 (*) 12.0 - 15.0 g/dL   HCT 28.3 (*) 36.0 - 46.0 %   MCV 92.8  78.0 - 100.0 fL  MCH 30.5  26.0 - 34.0 pg   MCHC 32.9  30.0 - 36.0 g/dL   RDW 16.0 (*) 11.5 - 15.5 %   Platelets 183  150 - 400 K/uL   Comment: REPEATED TO VERIFY     DELTA CHECK NOTED  PROTIME-INR     Status: Abnormal   Collection Time    04/11/13  4:56 AM      Result Value Ref Range   Prothrombin Time 20.1 (*) 11.6 - 15.2 seconds   INR 1.77 (*) 0.00 - 1.49  GLUCOSE, CAPILLARY     Status: Abnormal   Collection Time    04/11/13   7:40 AM      Result Value Ref Range   Glucose-Capillary 143 (*) 70 - 99 mg/dL   Comment 1 Documented in Chart     Comment 2 Notify RN    GLUCOSE, CAPILLARY     Status: Abnormal   Collection Time    04/11/13 12:07 PM      Result Value Ref Range   Glucose-Capillary 136 (*) 70 - 99 mg/dL  GLUCOSE, CAPILLARY     Status: Abnormal   Collection Time    04/11/13  4:40 PM      Result Value Ref Range   Glucose-Capillary 137 (*) 70 - 99 mg/dL   Comment 1 Documented in Chart     Comment 2 Notify RN      Dg Chest 2 View  04/10/2013   CLINICAL DATA:  Chest pain, valve replacement  EXAM: CHEST  2 VIEW  COMPARISON:  03/26/2013  FINDINGS: Left basilar opacity, likely atelectasis. Probable trace right pleural effusion, decreased. No pneumothorax.  The heart is top-normal in size. Postsurgical changes related to prior CABG. Prosthetic aortic valve.  Cholecystectomy clips.  Mild degenerative changes of the visualized thoracolumbar spine. Lumbar spine fixation hardware, incompletely visualized.  IMPRESSION: Left basilar opacity, likely atelectasis.  Probable trace right pleural effusion, decreased.   Electronically Signed   By: Julian Hy M.D.   On: 04/10/2013 00:19    ROS Blood pressure 114/72, pulse 74, temperature 97.7 F (36.5 C), temperature source Oral, resp. rate 18, height _0  (1.626 m), weight 75 kg (165 lb 5.5 oz), SpO2 99.00%. Physical Exam Physical Examination: General appearance - alert, well appearing, and in no distress and overweight Mental status - alert, oriented to person, place, and time Eyes - pupils equal and reactive, extraocular eye movements intact, funduscopic exam normal, discs flat and sharp Mouth - upper plate, lower few central teeth Neck - adenopathy noted PCL Lymphatics - posterior cervical nodes Chest - decreased bs, with out R,R,W Heart - normal rate, regular rhythm, Gr 2/6 SEM ULSB, 2 component Rub ULSB Abdomen - no rebound tenderness noted hepatomegaly liver  down 5 cm. Pos bs, soft, 2 wounds upper abdm Musculoskeletal - no joint tenderness, deformity or swelling Extremities - AVG L groin B&T, Bruit R Fem, tr edema Skin - normal coloration and turgor, no rashes, no suspicious skin lesions noted Very dry  Assessment/Plan: 1 CP with Rudean Hitt ? Related to pericardial inflam 2 ESRD: needs HD, records obtained 3 Hypertension: not an issue caution with rub 4. Anemia of ESRD: will give EPO 5. Metabolic Bone Disease: cont Vit D 6 Pericardial RUB ?? infx 7 S/p AVR and CABG 8 Liver Tx cont Tac 9COPD 10GERD 11  DM control 12 ^ lipids P HD, epo Vit D, control DM, monitor  [name] L 04/11/2013, 7:17 PM

## 2013-04-11 NOTE — Progress Notes (Addendum)
ANTICOAGULATION CONSULT NOTE - Follow Up Consult  Pharmacy Consult for Heparin / Coumadin Indication: AVR  Allergies  Allergen Reactions  . Acetaminophen Other (See Comments)    Liver transplant recipient   . Codeine Itching    Patient Measurements: Height: 5\' 4"  (162.6 cm) Weight: 165 lb 5.5 oz (75 kg) IBW/kg (Calculated) : 54.7 Heparin Dosing Weight: 70 kg  Vital Signs: Temp: 97.7 F (36.5 C) (03/02 0541) Temp src: Oral (03/02 0541) BP: 122/47 mmHg (03/02 0541) Pulse Rate: 71 (03/02 0541)  Labs:  Recent Labs  04/10/13 0125 04/10/13 0734 04/10/13 0735 04/10/13 1403 04/10/13 1443 04/10/13 1958 04/10/13 2313 04/11/13 0456  HGB 9.6* 9.4*  --   --   --   --   --  9.3*  HCT 29.9* 28.8*  --   --   --   --   --  28.3*  PLT 159 136*  --   --   --   --   --  183  LABPROT 17.2* 18.8*  --   --   --   --   --  20.1*  INR 1.44 1.62*  --   --   --   --   --  1.77*  HEPARINUNFRC  --   --   --   --  0.32  --  0.49 0.51  CREATININE 7.53* 8.02*  --   --   --   --   --   --   TROPONINI <0.30  --  <0.30 <0.30  --  <0.30  --   --     Estimated Creatinine Clearance: 7.6 ml/min (by C-G formula based on Cr of 8.02).  Assessment: 58 yo female with AVR is currently on therapeutic heparin.  Heparin level is 0.51 INR trending up = 1.77 today  Goal of Therapy: ; Heparin level 0.3-0.7 units/ml Monitor platelets by anticoagulation protocol: Yes INR = 2.5 to 3.5   Plan:  1) Continue heparin at 1000 units/hr 2) Coumadin 4 mg po x 1 dose today 3) Follow up AM labs  Thank you. Anette Guarneri, PharmD [phone]  04/11/2013,9:08 AM

## 2013-04-12 ENCOUNTER — Encounter (HOSPITAL_COMMUNITY): Payer: Self-pay | Admitting: Physician Assistant

## 2013-04-12 DIAGNOSIS — I2510 Atherosclerotic heart disease of native coronary artery without angina pectoris: Secondary | ICD-10-CM

## 2013-04-12 DIAGNOSIS — R002 Palpitations: Secondary | ICD-10-CM

## 2013-04-12 LAB — HEPARIN LEVEL (UNFRACTIONATED): Heparin Unfractionated: 0.5 IU/mL (ref 0.30–0.70)

## 2013-04-12 LAB — CBC
HCT: 29.7 % — ABNORMAL LOW (ref 36.0–46.0)
Hemoglobin: 9.7 g/dL — ABNORMAL LOW (ref 12.0–15.0)
MCH: 30.3 pg (ref 26.0–34.0)
MCHC: 32.7 g/dL (ref 30.0–36.0)
MCV: 92.8 fL (ref 78.0–100.0)
Platelets: 198 10*3/uL (ref 150–400)
RBC: 3.2 MIL/uL — ABNORMAL LOW (ref 3.87–5.11)
RDW: 15.7 % — ABNORMAL HIGH (ref 11.5–15.5)
WBC: 6.3 10*3/uL (ref 4.0–10.5)

## 2013-04-12 LAB — GLUCOSE, CAPILLARY
GLUCOSE-CAPILLARY: 104 mg/dL — AB (ref 70–99)
GLUCOSE-CAPILLARY: 173 mg/dL — AB (ref 70–99)
Glucose-Capillary: 160 mg/dL — ABNORMAL HIGH (ref 70–99)
Glucose-Capillary: 173 mg/dL — ABNORMAL HIGH (ref 70–99)

## 2013-04-12 LAB — PROTIME-INR
INR: 1.76 — AB (ref 0.00–1.49)
PROTHROMBIN TIME: 20 s — AB (ref 11.6–15.2)

## 2013-04-12 MED ORDER — WARFARIN SODIUM 6 MG PO TABS
6.0000 mg | ORAL_TABLET | Freq: Once | ORAL | Status: AC
  Administered 2013-04-12: 6 mg via ORAL
  Filled 2013-04-12: qty 1

## 2013-04-12 MED ORDER — METOPROLOL TARTRATE 50 MG PO TABS
50.0000 mg | ORAL_TABLET | Freq: Two times a day (BID) | ORAL | Status: DC
  Administered 2013-04-12 – 2013-04-15 (×6): 50 mg via ORAL
  Filled 2013-04-12 (×7): qty 1

## 2013-04-12 NOTE — Progress Notes (Signed)
Subjective:  No new complaints- had HD last night, removed 3000 tolerated well.   Objective Vital signs in last 24 hours: Filed Vitals:   04/12/13 0025 04/12/13 0112 04/12/13 0519 04/12/13 0803  BP: 151/77 143/53 98/46   Pulse: 76 79 87   Temp: 97.9 F (36.6 C) 98.1 F (36.7 C) 98.9 F (37.2 C)   TempSrc: Oral Oral Oral   Resp: 18 18 18    Height:      Weight: 71.7 kg (158 lb 1.1 oz)     SpO2: 100% 100% 100% 98%   Weight change: -0.3 kg (-10.6 oz)  Intake/Output Summary (Last 24 hours) at 04/12/13 1000 Last data filed at 04/12/13 0600  Gross per 24 hour  Intake  711.5 ml  Output   3001 ml  Net -2289.5 ml   Dialyzes at [facility] on MWF since @/15. Primary Nephrologist Colodonato. EDW 72kg.  HD Bath 2K, 2 Ca, Dialyzer 190NR, Heparin Strd. Access L thigh AVG    Assessment/ Plan: Pt is a 58 y.o. yo female with ESRD who was admitted on 04/09/2013 with chest pain and palpitations  Assessment/Plan: 1. CP and palpitations- in setting of CABG/valve replacement-  per cards, work up seems to be negative, awaiting therapeutic INR 2. ESRD - normally [place] MWF via thigh AVG- done last night, will write orders for first shift in the AM as I anticipate discharge if INR is therapeutic 3. Anemia- hgb 9.7- on aranesp 4. Secondary hyperparathyroidism- cont tums/sensipar/hectorol 5. HTN/volume- on lopressor- BP good- near EDW 6. S/p liver tx- on prograf  [name] Calbert A    Labs: Basic Metabolic Panel:  Recent Labs Lab 04/10/13 0125 04/10/13 0734 04/11/13 2008  NA 141 143 136*  K 3.4* 3.7 4.7  CL 96 98 94*  CO2 25 28 23   GLUCOSE 196* 158* 200*  BUN 34* 37* 57*  CREATININE 7.53* 8.02* 10.06*  CALCIUM 8.6 8.7 8.1*  PHOS  --   --  5.0*   Liver Function Tests:  Recent Labs Lab 04/10/13 0734 04/11/13 2008  AST 23 20  ALT 14 14  ALKPHOS 95 95  BILITOT 0.3 0.3  PROT 6.7 6.9  ALBUMIN 2.6* 2.5*   No results found for this basename: LIPASE, AMYLASE,  in the last 168  hours No results found for this basename: AMMONIA,  in the last 168 hours CBC:  Recent Labs Lab 04/10/13 0125 04/10/13 0734 04/11/13 0456 04/11/13 2008 04/12/13 0700  WBC 6.6 6.6 7.3 7.3 6.3  NEUTROABS 3.5  --   --   --   --   HGB 9.6* 9.4* 9.3* 9.5* 9.7*  HCT 29.9* 28.8* 28.3* 28.4* 29.7*  MCV 93.7 94.1 92.8 92.2 92.8  PLT 159 136* 183 201 198   Cardiac Enzymes:  Recent Labs Lab 04/10/13 0125 04/10/13 0735 04/10/13 1403 04/10/13 1958  TROPONINI <0.30 <0.30 <0.30 <0.30   CBG:  Recent Labs Lab 04/11/13 0740 04/11/13 1207 04/11/13 1640 04/12/13 0055 04/12/13 0744  GLUCAP 143* 136* 137* 104* 160*    Iron Studies: No results found for this basename: IRON, TIBC, TRANSFERRIN, FERRITIN,  in the last 72 hours Studies/Results: No results found. Medications: Infusions: . heparin 1,000 Units/hr (04/12/13 0600)    Scheduled Medications: . aspirin EC  81 mg Oral Daily  . budesonide-formoterol  2 puff Inhalation BID  . calcium carbonate  1 tablet Oral Daily  . cinacalcet  30 mg Oral Q breakfast  . darbepoetin (ARANESP) injection - DIALYSIS  100 mcg Intravenous Q Mon-HD  .  docusate sodium  100 mg Oral Daily  . [START ON 04/13/2013] doxercalciferol  4 mcg Intravenous Q M,W,F-HD  . DULoxetine  60 mg Oral QHS  . gabapentin  300 mg Oral QHS  . insulin aspart  0-9 Units Subcutaneous TID WC  . levothyroxine  150 mcg Oral QAC breakfast  . LORazepam  0.5 mg Oral QHS  . metoprolol tartrate  37.5 mg Oral BID  . multivitamin  1 tablet Oral QHS  . nicotine  7 mg Transdermal Daily  . pantoprazole  40 mg Oral Daily  . senna  1 tablet Oral QPM  . tacrolimus  5 mg Oral BID  . Warfarin - Pharmacist Dosing Inpatient   Does not apply q1800    have reviewed scheduled and prn medications.  Physical Exam: General: NAD Heart: RRR Lungs: moslty clear Abdomen: soft, non tender Extremities: no edema Dialysis Access: thigh AVG    04/12/2013,10:00 AM  LOS: 3 days

## 2013-04-12 NOTE — Progress Notes (Signed)
ANTICOAGULATION CONSULT NOTE - Follow Up Consult  Pharmacy Consult for Heparin / Coumadin Indication: AVR  Allergies  Allergen Reactions  . Acetaminophen Other (See Comments)    Liver transplant recipient   . Codeine Itching    Patient Measurements: Height: 5\' 4"  (162.6 cm) Weight: 158 lb 1.1 oz (71.7 kg) IBW/kg (Calculated) : 54.7 Heparin Dosing Weight: 70 kg  Vital Signs: Temp: 98.9 F (37.2 C) (03/03 0519) Temp src: Oral (03/03 0519) BP: 98/46 mmHg (03/03 0519) Pulse Rate: 87 (03/03 0519)  Labs:  Recent Labs  04/10/13 0125 04/10/13 0734 04/10/13 0735 04/10/13 1403  04/10/13 1958 04/10/13 2313 04/11/13 0456 04/11/13 2008 04/12/13 0700  HGB 9.6* 9.4*  --   --   --   --   --  9.3* 9.5* 9.7*  HCT 29.9* 28.8*  --   --   --   --   --  28.3* 28.4* 29.7*  PLT 159 136*  --   --   --   --   --  183 201 198  LABPROT 17.2* 18.8*  --   --   --   --   --  20.1*  --  20.0*  INR 1.44 1.62*  --   --   --   --   --  1.77*  --  1.76*  HEPARINUNFRC  --   --   --   --   < >  --  0.49 0.51  --  0.50  CREATININE 7.53* 8.02*  --   --   --   --   --   --  10.06*  --   TROPONINI <0.30  --  <0.30 <0.30  --  <0.30  --   --   --   --   < > = values in this interval not displayed.  Estimated Creatinine Clearance: 5.9 ml/min (by C-G formula based on Cr of 10.06).  Assessment: 58 yo female with AVR is currently on therapeutic heparin.  Heparin level is 0.5 INR remains the same today = 1.76 today after 2 doses of 4 mg of coumadin.  Home dose of coumadin was 2 mg po daily.  Hg 9.7, got aranesp 100 mcg yesterday w/ HD.  PLT 198.  No bleeding reported. INR goal 2.5 - 3.5.   Goal of Therapy: ; Heparin level 0.3-0.7 units/ml Monitor platelets by anticoagulation protocol: Yes INR = 2.5 to 3.5   Plan:  1) Continue heparin at 1000 units/hr 2) Coumadin 6 mg po x 1 dose today 3) Follow up AM labs  Eudelia Bunch, Pharm.D. [phone] 04/12/2013 10:30 AM

## 2013-04-12 NOTE — Evaluation (Signed)
Physical Therapy Evaluation Patient Details Name: [name] MRN: [id number] DOB: 1955/10/14 Today's Date: 04/12/2013 Time: 8144-8185 PT Time Calculation (min): 14 min  PT Assessment / Plan / Recommendation History of Present Illness  58 y.o. female admitted to [facility] from [facility] with CAD s/p recent single-vessel CABG (LIMA -> LAD) and AVR (03/15/13) by Dr. Prescott Gum, ESRD, ESLD s/p liver transplant (2011), DM, HTN, HLD, anxiety, and tobacco use, who present for further evaluation of chest pain and palpitations.  She has had mild chest wall soreness since being d/c'ed on 03/28/13 to SNF for rehab.  However, yesterday at ~1 PM, she noticed progressive worsening of the chest pain.   Dx with CP with tachycardia after HD.    Clinical Impression  Pt is walking faster than therapist with good speed, good dynamic balance as demonstrated by 23/24 score on DGI.  Pt has no acute or f/u PT needs at this time.  She is good to d/c home with family's intermittent assist and no f/u therapy.  No DME needed at this time either and she certainly does not need to return to [facility] for any further rehab at this time.    PT Assessment  Patent does not need any further PT services    Follow Up Recommendations  No PT follow up    Does the patient have the potential to tolerate intense rehabilitation     NA  Barriers to Discharge   None      Equipment Recommendations  None recommended by PT    Recommendations for Other Services    None  Frequency   NA- one time eval and d/c   Precautions / Restrictions Precautions Precautions: Sternal   Pertinent Vitals/Pain See vitals flow sheet.      Mobility  Bed Mobility Overal bed mobility: Independent Transfers Overall transfer level: Independent Equipment used: None Transfers: Sit to/from Stand Sit to Stand: Independent Ambulation/Gait Ambulation/Gait assistance: Independent Ambulation Distance (Feet): 350 Feet Assistive device: None Gait  Pattern/deviations: WFL(Within Functional Limits) Gait velocity interpretation: at or above normal speed for age/gender Stairs: Yes Stairs assistance: Modified independent (Device/Increase time) Stair Management: One rail Right;One rail Left;Alternating pattern;Forwards Number of Stairs: 5 (limited by IV line length)        PT Goals(Current goals can be found in the care plan section) Acute Rehab PT Goals PT Goal Formulation: No goals set, d/c therapy  Visit Information  Last PT Received On: 04/12/13 Assistance Needed: +1 History of Present Illness: 58 y.o. female admitted to [facility] from [place] with CAD s/p recent single-vessel CABG (LIMA -> LAD) and AVR (03/15/13) by Dr. Prescott Gum, ESRD, ESLD s/p liver transplant (2011), DM, HTN, HLD, anxiety, and tobacco use, who present for further evaluation of chest pain and palpitations.  She has had mild chest wall soreness since being d/c'ed on 03/28/13 to SNF for rehab.  However, yesterday at ~1 PM, she noticed progressive worsening of the chest pain.   Dx with CP with tachycardia after HD.         Prior War expects to be discharged to:: Private residence Living Arrangements: Alone Available Help at Discharge: Available PRN/intermittently Type of Home: Mobile home Home Access: Stairs to enter Entrance Stairs-Number of Steps: 5 Entrance Stairs-Rails: Right;Left;Can reach both Home Layout: One level Home Equipment: [place] - 2 wheels;Cane - single point Prior Function Level of Independence: Independent Comments: Was at [facility] preparing for d/c home this week Communication Communication: No difficulties  Cognition  Cognition Arousal/Alertness: Awake/alert Behavior During Therapy: WFL for tasks assessed/performed Overall Cognitive Status: Within Functional Limits for tasks assessed    Extremity/Trunk Assessment Upper Extremity Assessment Upper Extremity Assessment: Overall WFL for tasks  assessed Lower Extremity Assessment Lower Extremity Assessment: Overall WFL for tasks assessed Cervical / Trunk Assessment Cervical / Trunk Assessment: Normal   Balance Standardized Balance Assessment Standardized Balance Assessment : Dynamic Gait Index Dynamic Gait Index Level Surface: Normal Change in Gait Speed: Normal Gait with Horizontal Head Turns: Normal Gait with Vertical Head Turns: Normal Gait and Pivot Turn: Normal Step Over Obstacle: Normal Step Around Obstacles: Normal Steps: Mild Impairment Total Score: 23  End of Session PT - End of Session Activity Tolerance: Patient tolerated treatment well Patient left: in bed;with call bell/phone within reach;Other (comment) (seated EOB eating dinner) Nurse Communication: Mobility status;Other (comment) (no HH f/u needs at d/c)    Wells Guiles B. [place], [place], DPT [phone]   04/12/2013, 5:28 PM

## 2013-04-12 NOTE — Progress Notes (Signed)
Patient Name: [name] Date of Encounter: 04/12/2013  Principal Problem:   Chest pain- with tachycardia Active Problems:   Palpitations- run of tachycardia after HD, lasted 15-20 minutes   ESRD on HD    SUBJECTIVE: Tired, had HD late yesterday, no more chest pain, no palps  OBJECTIVE Filed Vitals:   04/12/13 0025 04/12/13 0112 04/12/13 0519 04/12/13 0803  BP: 151/77 143/53 98/46   Pulse: 76 79 87   Temp: 97.9 F (36.6 C) 98.1 F (36.7 C) 98.9 F (37.2 C)   TempSrc: Oral Oral Oral   Resp: 18 18 18    Height:      Weight: 158 lb 1.1 oz (71.7 kg)     SpO2: 100% 100% 100% 98%    Intake/Output Summary (Last 24 hours) at 04/12/13 1107 Last data filed at 04/12/13 0600  Gross per 24 hour  Intake  711.5 ml  Output   3001 ml  Net -2289.5 ml   Filed Weights   04/11/13 0541 04/11/13 2023 04/12/13 0025  Weight: 165 lb 5.5 oz (75 kg) 164 lb 10.9 oz (74.7 kg) 158 lb 1.1 oz (71.7 kg)    PHYSICAL EXAM General: Well developed, well nourished, female in no acute distress. Head: Normocephalic, atraumatic.  Neck: Supple without bruits, JVD minimal elevation Lungs:  Resp regular and unlabored, CTA, moderate insp effort. Heart: RRR, S1, S2, no S3, S4, crisp click, harsh murmur; no rub. Abdomen: Soft, non-tender, non-distended, BS + x 4.  Extremities: No clubbing, cyanosis, no edema.  Neuro: Alert and oriented X 3. Moves all extremities spontaneously. Psych: Normal affect.  LABS: CBC: Recent Labs  04/10/13 0125  04/11/13 2008 04/12/13 0700  WBC 6.6  < > 7.3 6.3  NEUTROABS 3.5  --   --   --   HGB 9.6*  < > 9.5* 9.7*  HCT 29.9*  < > 28.4* 29.7*  MCV 93.7  < > 92.2 92.8  PLT 159  < > 201 198  < > = values in this interval not displayed. INR:  Recent Labs  04/12/13 0700  INR 5.80*   Basic Metabolic Panel:  Recent Labs  04/10/13 0734 04/11/13 2008  NA 143 136*  K 3.7 4.7  CL 98 94*  CO2 28 23  GLUCOSE 158* 200*  BUN 37* 57*  CREATININE 8.02* 10.06*    CALCIUM 8.7 8.1*  MG 2.1  --   PHOS  --  5.0*   Liver Function Tests:  Recent Labs  04/10/13 0734 04/11/13 2008  AST 23 20  ALT 14 14  ALKPHOS 95 95  BILITOT 0.3 0.3  PROT 6.7 6.9  ALBUMIN 2.6* 2.5*   Cardiac Enzymes:  Recent Labs  04/10/13 0735 04/10/13 1403 04/10/13 1958  TROPONINI <0.30 <0.30 <0.30   TELE:   SR, no significant ectopy     Echo: 04/10/2013 Study Conclusions - Left ventricle: The cavity size was normal. There was mild focal basal hypertrophy of the septum. Systolic function was normal. The estimated ejection fraction was 55%. There is hypokinesis of the basalinferior myocardium. Features are consistent with a pseudonormal left ventricular filling pattern, with concomitant abnormal relaxation and increased filling pressure (grade 2 diastolic dysfunction). - Aortic valve: A 80mmCarboMedics mechanical prosthesis was present. Gradients are in normal range for this type of prosthesis. Cannot exclude small perivalvular leak versus eccentric aortic regurgitation. Mean gradient: 81mm Hg (S). Peak gradient: 52mm Hg (S). - Mitral valve: Calcified annulus. Trivial regurgitation. - Left atrium: The atrium  was mildly dilated. - Right ventricle: Systolic function was mildly reduced. - Right atrium: The atrium was mildly dilated. Central venous pressure: 19mm Hg (est). - Pulmonary arteries: Systolic pressure could not be accurately estimated. - Pericardium, extracardiac: There was no pericardial effusion. Impressions: - Mild basal septal hypertrophy with LVEF approximately 55%, basal inferior hypokinesis, grade 2 diastolic dysfunction. Mild biatrial enlargement. Reported to have 21 mm CarboMedics tophat aortic prosthesis - gradients are in normal range for this type of prosthesis. Cannot exclude small perivalvular leak versus eccentric aortic regurgitation. Unable to assess PASP.   Current Medications:  . aspirin EC  81 mg Oral Daily  .  budesonide-formoterol  2 puff Inhalation BID  . calcium carbonate  1 tablet Oral Daily  . cinacalcet  30 mg Oral Q breakfast  . darbepoetin (ARANESP) injection - DIALYSIS  100 mcg Intravenous Q Mon-HD  . docusate sodium  100 mg Oral Daily  . [START ON 04/13/2013] doxercalciferol  4 mcg Intravenous Q M,W,F-HD  . DULoxetine  60 mg Oral QHS  . gabapentin  300 mg Oral QHS  . insulin aspart  0-9 Units Subcutaneous TID WC  . levothyroxine  150 mcg Oral QAC breakfast  . LORazepam  0.5 mg Oral QHS  . metoprolol tartrate  37.5 mg Oral BID  . multivitamin  1 tablet Oral QHS  . nicotine  7 mg Transdermal Daily  . pantoprazole  40 mg Oral Daily  . senna  1 tablet Oral QPM  . tacrolimus  5 mg Oral BID  . warfarin  6 mg Oral ONCE-1800  . Warfarin - Pharmacist Dosing Inpatient   Does not apply q1800   . heparin 1,000 Units/hr (04/12/13 0600)    ASSESSMENT AND PLAN: Principal Problem:   Chest pain- with tachycardia: ez neg MI, no recurrence of tachycardia, on BB (held last pm since pt in HD)  Active Problems:   Palpitations- run of tachycardia after HD, lasted 15-20 minutes - see above, cont. BB    ESRD on HD - had HD last pm, continue home Rx and schedule    tob use - nicotine patch, cessation encouraged.    S/p AOVR - 03/15/2013, functioning well, see echo report above.  Plan - d/c when medically stable.  Signed, Rosaria Ferries , PA-C 11:07 AM 04/12/2013   Patient seen and examined. Agree with assessment and plan. INR 1.76 today, received 4 mg coumadin yesterday. Feels better. HR still 85-90, will further titrate lopressor to 50 mg bid as BP and P allow.  Troy Sine, MD, [facility] 04/12/2013 1:26 PM

## 2013-04-13 DIAGNOSIS — Z7901 Long term (current) use of anticoagulants: Secondary | ICD-10-CM

## 2013-04-13 DIAGNOSIS — I359 Nonrheumatic aortic valve disorder, unspecified: Secondary | ICD-10-CM

## 2013-04-13 DIAGNOSIS — Z5181 Encounter for therapeutic drug level monitoring: Secondary | ICD-10-CM

## 2013-04-13 LAB — CBC
HEMATOCRIT: 29.1 % — AB (ref 36.0–46.0)
Hemoglobin: 9.3 g/dL — ABNORMAL LOW (ref 12.0–15.0)
MCH: 29.9 pg (ref 26.0–34.0)
MCHC: 32 g/dL (ref 30.0–36.0)
MCV: 93.6 fL (ref 78.0–100.0)
Platelets: 185 10*3/uL (ref 150–400)
RBC: 3.11 MIL/uL — ABNORMAL LOW (ref 3.87–5.11)
RDW: 15.9 % — AB (ref 11.5–15.5)
WBC: 6.7 10*3/uL (ref 4.0–10.5)

## 2013-04-13 LAB — GLUCOSE, CAPILLARY
GLUCOSE-CAPILLARY: 175 mg/dL — AB (ref 70–99)
GLUCOSE-CAPILLARY: 215 mg/dL — AB (ref 70–99)
Glucose-Capillary: 178 mg/dL — ABNORMAL HIGH (ref 70–99)
Glucose-Capillary: 198 mg/dL — ABNORMAL HIGH (ref 70–99)

## 2013-04-13 LAB — HEPARIN LEVEL (UNFRACTIONATED)
HEPARIN UNFRACTIONATED: 0.26 [IU]/mL — AB (ref 0.30–0.70)
Heparin Unfractionated: 0.2 IU/mL — ABNORMAL LOW (ref 0.30–0.70)

## 2013-04-13 LAB — PROTIME-INR
INR: 1.84 — ABNORMAL HIGH (ref 0.00–1.49)
Prothrombin Time: 20.7 seconds — ABNORMAL HIGH (ref 11.6–15.2)

## 2013-04-13 LAB — TACROLIMUS LEVEL: Tacrolimus (FK506) - LabCorp: 6.1 ng/mL

## 2013-04-13 MED ORDER — HEPARIN SODIUM (PORCINE) 1000 UNIT/ML DIALYSIS: 20.0000 [IU]/kg | INTRAMUSCULAR | Status: DC | PRN

## 2013-04-13 MED ORDER — DOXERCALCIFEROL 4 MCG/2ML IV SOLN
INTRAVENOUS | Status: AC
  Filled 2013-04-13: qty 2

## 2013-04-13 MED ORDER — WARFARIN SODIUM 7.5 MG PO TABS
7.5000 mg | ORAL_TABLET | Freq: Once | ORAL | Status: AC
  Administered 2013-04-13: 7.5 mg via ORAL
  Filled 2013-04-13: qty 1

## 2013-04-13 NOTE — Procedures (Signed)
Patient was seen on dialysis and the procedure was supervised.  BFR 400  Via thigh AVG BP is  121/59.   Patient appears to be tolerating treatment well  Kalynn Declercq A 04/13/2013

## 2013-04-13 NOTE — Care Management Note (Unsigned)
    Page 1 of 1   04/13/2013     4:13:58 PM   CARE MANAGEMENT NOTE 04/13/2013  Patient:  ANIS, CINELLI   Account Number:  [id number]  Date Initiated:  04/10/2013  Documentation initiated by:  DAVIS,TYMEEKA  Subjective/Objective Assessment:   58 yo female admitted with chest pain.     Action/Plan:   Anticipated DC Date:     Anticipated DC Plan:           Choice offered to / List presented to:             Status of service:   Medicare Important Message given?   (If response is "NO", the following Medicare IM given date fields will be blank) Date Medicare IM given:   Date Additional Medicare IM given:    Discharge Disposition:    Per UR Regulation:  Reviewed for med. necessity/level of care/duration of stay  If discussed at Long Length of Stay Meetings, dates discussed:    Comments:  04-13-13 1612 Jacqlyn Krauss, RN,BSN [phone] In with cp-hx CAD s/p recent single-vessel CABG (LIMA -> LAD) and AVR (03/15/13). Awaiting therapeutic INR befor d/c home.  04/10/13 [place] [phone] UR complete

## 2013-04-13 NOTE — Progress Notes (Signed)
Subjective: No further palpitations. Denies CP/SOB.   Objective: Vital signs in last 24 hours: Temp:  [97.8 F (36.6 C)-99.4 F (37.4 C)] 98 F (36.7 C) (03/04 1104) Pulse Rate:  [70-85] 85 (03/04 1104) Resp:  [15-18] 18 (03/04 1104) BP: (96-135)/(41-69) 125/63 mmHg (03/04 1104) SpO2:  [100 %] 100 % (03/04 1104) Weight:  [158 lb 8.2 oz (71.9 kg)-163 lb 2.3 oz (74 kg)] 158 lb 8.2 oz (71.9 kg) (03/04 1104) Last BM Date: 04/12/13  Intake/Output from previous day:   Intake/Output this shift: Total I/O In: -  Out: 2000 [Other:2000]  Medications Current Facility-Administered Medications  Medication Dose Route Frequency Provider Last Rate Last Dose  . albuterol (PROVENTIL) (2.5 MG/3ML) 0.083% nebulizer solution 2.5 mg  2.5 mg Nebulization Q6H PRN Arnoldo Lenis, MD      . aspirin EC tablet 81 mg  81 mg Oral Daily Glena [place], MD   81 mg at 04/13/13 1134  . budesonide-formoterol (SYMBICORT) 160-4.5 MCG/ACT inhaler 2 puff  2 puff Inhalation BID Hetty Blend, MD   2 puff at 04/12/13 2009  . calcium carbonate (TUMS - dosed in mg elemental calcium) chewable tablet 200 mg of elemental calcium  1 tablet Oral Daily Christopher A. End, MD   200 mg of elemental calcium at 04/13/13 1134  . cinacalcet (SENSIPAR) tablet 30 mg  30 mg Oral Q breakfast Glena [place], MD   30 mg at 04/12/13 1002  . darbepoetin (ARANESP) injection 100 mcg  100 mcg Intravenous Q Mon-HD Placido Sou, MD   100 mcg at 04/11/13 2052  . docusate sodium (COLACE) capsule 100 mg  100 mg Oral Daily Glena [place], MD   100 mg at 04/12/13 1002  . doxercalciferol (HECTOROL) injection 4 mcg  4 mcg Intravenous Q M,W,F-HD Placido Sou, MD   4 mcg at 04/13/13 [phone]  . DULoxetine (CYMBALTA) DR capsule 60 mg  60 mg Oral QHS Glena [place] End, MD   60 mg at 04/12/13 2124  . gabapentin (NEURONTIN) capsule 300 mg  300 mg Oral QHS Christopher A. End, MD   300 mg at 04/12/13 2124  . heparin ADULT  infusion 100 units/mL (25000 units/250 mL)  1,100 Units/hr Intravenous Continuous Rogue Bussing, [facility] 11 mL/hr at 04/13/13 1033 1,100 Units/hr at 04/13/13 1033  . insulin aspart (novoLOG) injection 0-9 Units  0-9 Units Subcutaneous TID WC Hetty Blend, MD   2 Units at 04/12/13 1734  . levothyroxine (SYNTHROID, LEVOTHROID) tablet 150 mcg  150 mcg Oral QAC breakfast Glena [place] End, MD   150 mcg at 04/13/13 1134  . LORazepam (ATIVAN) tablet 0.5 mg  0.5 mg Oral QHS Glena [place] End, MD   0.5 mg at 04/12/13 2124  . metoprolol (LOPRESSOR) tablet 50 mg  50 mg Oral BID Troy Sine, MD   50 mg at 04/13/13 1133  . multivitamin (RENA-VIT) tablet 1 tablet  1 tablet Oral QHS Placido Sou, MD   1 tablet at 04/12/13 2124  . nicotine (NICODERM CQ - dosed in mg/24 hr) patch 7 mg  7 mg Transdermal Daily Christopher A. End, MD   7 mg at 04/13/13 1133  . nitroGLYCERIN (NITROSTAT) SL tablet 0.4 mg  0.4 mg Sublingual Q5 Min x 3 PRN Glena [place] End, MD      . pantoprazole (PROTONIX) EC tablet 40 mg  40 mg Oral Daily Glena [place] End, MD   40 mg at 04/13/13 1133  . senna (  SENOKOT) tablet 8.6 mg  1 tablet Oral QPM Christopher A. End, MD   8.6 mg at 04/13/13 1134  . tacrolimus (PROGRAF) capsule 5 mg  5 mg Oral BID Glena [place] End, MD   5 mg at 04/13/13 1133  . Warfarin - Pharmacist Dosing Inpatient   Does not apply [place], [facility]        PE: General appearance: alert, cooperative and no distress Lungs: clear to auscultation bilaterally Heart: regular rate and rhythm and 2/6 SM at LUSB Extremities: no LEE Pulses: 2+ and symmetric Skin: warm and dry Neurologic: Grossly normal  Lab Results:   Recent Labs  04/11/13 2008 04/12/13 0700 04/13/13 0500  WBC 7.3 6.3 6.7  HGB 9.5* 9.7* 9.3*  HCT 28.4* 29.7* 29.1*  PLT 201 198 185   BMET  Recent Labs  04/11/13 2008  NA 136*  K 4.7  CL 94*  CO2 23  GLUCOSE 200*  BUN 57*  CREATININE 10.06*  CALCIUM 8.1*    PT/INR  Recent Labs  04/11/13 0456 04/12/13 0700 04/13/13 0500  LABPROT 20.1* 20.0* 20.7*  INR 1.77* 1.76* 1.84*     Assessment/Plan    Principal Problem:   Chest pain- with tachycardia Active Problems:   Palpitations- run of tachycardia after HD, lasted 15-20 minutes  Plan: Pt just return from HD and is w/o complaints. NSR on telemetry. HR in the 80s. INR remains subtherapeutic at 1.84. She has a mechanical aortic valve. Will need to continue IV heparin bridge until INR is therapeutic. Goal with mechanical valve is 2.5-3.5. MD to follow.     LOS: 4 days    Brittainy M. Ladoris Gene 04/13/2013 11:37 AM   Patient seen and examined. Agree with assessment and plan. Just back from dialysis. HR 84 with sinus rhythm. On heparin bridging.Pt received 6 mg warfarin yesterday, INR today increased to 1.84. With mechanical AVR aim for 2.5 - 3. Prob can dc home when > 2.0   Troy Sine, MD, [facility] 04/13/2013 11:57 AM

## 2013-04-13 NOTE — Progress Notes (Signed)
ANTICOAGULATION CONSULT NOTE - Follow Up Consult  Pharmacy Consult for heparin Indication: AVR  Labs:  Recent Labs  04/10/13 0734 04/10/13 0735 04/10/13 1403  04/10/13 1958  04/11/13 0456 04/11/13 2008 04/12/13 0700 04/13/13 0500  HGB 9.4*  --   --   --   --   --  9.3* 9.5* 9.7* 9.3*  HCT 28.8*  --   --   --   --   --  28.3* 28.4* 29.7* 29.1*  PLT 136*  --   --   --   --   --  183 201 198 185  LABPROT 18.8*  --   --   --   --   --  20.1*  --  20.0* 20.7*  INR 1.62*  --   --   --   --   --  1.77*  --  1.76* 1.84*  HEPARINUNFRC  --   --   --   < >  --   < > 0.51  --  0.50 0.20*  CREATININE 8.02*  --   --   --   --   --   --  10.06*  --   --   TROPONINI  --  <0.30 <0.30  --  <0.30  --   --   --   --   --   < > = values in this interval not displayed.   Assessment: 58yo female now subtherapeutic on heparin after multiple levels at goal, no gtt interruption overnight.  Goal of Therapy:  Heparin level 0.3-0.7 units/ml   Plan:  Will increase heparin gtt slightly to 1100 units/hr and check level in [place], PharmD, BCPS  04/13/2013,6:34 AM

## 2013-04-13 NOTE — Progress Notes (Signed)
Subjective:  Seen on dialysis , no complaints- INR 1.8 this AM Objective Vital signs in last 24 hours: Filed Vitals:   04/13/13 0730 04/13/13 0800 04/13/13 0830 04/13/13 0900  BP: 111/65 96/57 124/68 121/59  Pulse: 74 77 70 70  Temp:      TempSrc:      Resp:      Height:      Weight:      SpO2:       Weight change:  No intake or output data in the 24 hours ending 04/13/13 0916 Dialyzes at [place] on MWF since @/15. Primary Nephrologist Colodonato. EDW 72kg.  HD Bath 2K, 2 Ca, Dialyzer 190NR, Heparin Strd. Access L thigh AVG    Assessment/ Plan: Pt is a 58 y.o. yo female with ESRD who was admitted on 04/09/2013 with chest pain and palpitations  Assessment/Plan: 1. CP and palpitations- in setting of CABG/valve replacement-  per cards, work up seems to be negative, awaiting therapeutic INR 2. ESRD - normally [place] MWF via thigh AVG- continue schedule while here 3. Anemia- hgb 9.3-  on aranesp 4. Secondary hyperparathyroidism- cont tums/sensipar/hectorol 5. HTN/volume- on lopressor- BP good- near EDW 6. S/p liver tx- on prograf 7. Dispo- i assume home once INR threrapeutic  Chisa Kushner A    Labs: Basic Metabolic Panel:  Recent Labs Lab 04/10/13 0125 04/10/13 0734 04/11/13 2008  NA 141 143 136*  K 3.4* 3.7 4.7  CL 96 98 94*  CO2 25 28 23   GLUCOSE 196* 158* 200*  BUN 34* 37* 57*  CREATININE 7.53* 8.02* 10.06*  CALCIUM 8.6 8.7 8.1*  PHOS  --   --  5.0*   Liver Function Tests:  Recent Labs Lab 04/10/13 0734 04/11/13 2008  AST 23 20  ALT 14 14  ALKPHOS 95 95  BILITOT 0.3 0.3  PROT 6.7 6.9  ALBUMIN 2.6* 2.5*   No results found for this basename: LIPASE, AMYLASE,  in the last 168 hours No results found for this basename: AMMONIA,  in the last 168 hours CBC:  Recent Labs Lab 04/10/13 0125 04/10/13 0734 04/11/13 0456 04/11/13 2008 04/12/13 0700 04/13/13 0500  WBC 6.6 6.6 7.3 7.3 6.3 6.7  NEUTROABS 3.5  --   --   --   --   --   HGB 9.6* 9.4* 9.3*  9.5* 9.7* 9.3*  HCT 29.9* 28.8* 28.3* 28.4* 29.7* 29.1*  MCV 93.7 94.1 92.8 92.2 92.8 93.6  PLT 159 136* 183 201 198 185   Cardiac Enzymes:  Recent Labs Lab 04/10/13 0125 04/10/13 0735 04/10/13 1403 04/10/13 1958  TROPONINI <0.30 <0.30 <0.30 <0.30   CBG:  Recent Labs Lab 04/12/13 0055 04/12/13 0744 04/12/13 1134 04/12/13 1645 04/12/13 2035  GLUCAP 104* 160* 173* 175* 173*    Iron Studies: No results found for this basename: IRON, TIBC, TRANSFERRIN, FERRITIN,  in the last 72 hours Studies/Results: No results found. Medications: Infusions: . heparin 1,100 Units/hr (04/13/13 0645)    Scheduled Medications: . aspirin EC  81 mg Oral Daily  . budesonide-formoterol  2 puff Inhalation BID  . calcium carbonate  1 tablet Oral Daily  . cinacalcet  30 mg Oral Q breakfast  . darbepoetin (ARANESP) injection - DIALYSIS  100 mcg Intravenous Q Mon-HD  . docusate sodium  100 mg Oral Daily  . doxercalciferol      . doxercalciferol  4 mcg Intravenous Q M,W,F-HD  . DULoxetine  60 mg Oral QHS  . gabapentin  300 mg Oral QHS  .  insulin aspart  0-9 Units Subcutaneous TID WC  . levothyroxine  150 mcg Oral QAC breakfast  . LORazepam  0.5 mg Oral QHS  . metoprolol tartrate  50 mg Oral BID  . multivitamin  1 tablet Oral QHS  . nicotine  7 mg Transdermal Daily  . pantoprazole  40 mg Oral Daily  . senna  1 tablet Oral QPM  . tacrolimus  5 mg Oral BID  . Warfarin - Pharmacist Dosing Inpatient   Does not apply q1800    have reviewed scheduled and prn medications.  Physical Exam: General: NAD Heart: RRR Lungs: moslty clear Abdomen: soft, non tender Extremities: no edema Dialysis Access: thigh AVG    04/13/2013,9:16 AM  LOS: 4 days

## 2013-04-13 NOTE — Progress Notes (Signed)
ANTICOAGULATION CONSULT NOTE - Follow Up Consult  Pharmacy Consult for Heparin / Coumadin Indication: AVR  Allergies  Allergen Reactions  . Acetaminophen Other (See Comments)    Liver transplant recipient   . Codeine Itching    Patient Measurements: Height: 5\' 4"  (162.6 cm) Weight: 158 lb 8.2 oz (71.9 kg) IBW/kg (Calculated) : 54.7 Heparin Dosing Weight: 70 kg  Vital Signs: Temp: 98.1 F (36.7 C) (03/04 1417) Temp src: Oral (03/04 1417) BP: 113/55 mmHg (03/04 1417) Pulse Rate: 72 (03/04 1417)  Labs:  Recent Labs  04/10/13 1958  04/11/13 0456 04/11/13 2008 04/12/13 0700 04/13/13 0500 04/13/13 1440  HGB  --   < > 9.3* 9.5* 9.7* 9.3*  --   HCT  --   < > 28.3* 28.4* 29.7* 29.1*  --   PLT  --   < > 183 201 198 185  --   LABPROT  --   --  20.1*  --  20.0* 20.7*  --   INR  --   --  1.77*  --  1.76* 1.84*  --   HEPARINUNFRC  --   < > 0.51  --  0.50 0.20* 0.26*  CREATININE  --   --   --  10.06*  --   --   --   TROPONINI <0.30  --   --   --   --   --   --   < > = values in this interval not displayed.  Estimated Creatinine Clearance: 5.9 ml/min (by C-G formula based on Cr of 10.06).  Assessment: 58 yo female with AVR is currently on heparin and coumadin bridge for AVR and afib.[name]  Heparin level is 0.26 after heparin rate increased this morning from 1000 units/hr to 1100 units/hr.  HL slightly below goal.  No heparin charted as given during HD today.  No bleeding reported.  INR 1.84 after 4 - 4 - 6 mg doses of coumadin.  Home dose of coumadin was 2 mg po daily.  Hg 9.3, got aranesp 100 mcg Monday w/ HD.  PLT 185.  No bleeding reported. INR goal 2.5 - 3.5.   Goal of Therapy: ; Heparin level 0.3-0.7 units/ml Monitor platelets by anticoagulation protocol: Yes INR = 2.5 to 3.5   Plan:  1) increase heparin to 1200 units/hr and recheck check HL in 8 hrs 2) Coumadin 7.5 mg po x 1 dose today (prior home dose 2 mg qday) 3) daily HL, INR, CBC Eudelia Bunch,  Pharm.D. [phone] 04/13/2013 3:18 PM

## 2013-04-14 ENCOUNTER — Other Ambulatory Visit: Payer: Self-pay | Admitting: *Deleted

## 2013-04-14 DIAGNOSIS — Z7901 Long term (current) use of anticoagulants: Secondary | ICD-10-CM

## 2013-04-14 DIAGNOSIS — Z952 Presence of prosthetic heart valve: Secondary | ICD-10-CM

## 2013-04-14 DIAGNOSIS — N186 End stage renal disease: Secondary | ICD-10-CM

## 2013-04-14 LAB — PROTIME-INR
INR: 1.98 — AB (ref 0.00–1.49)
INR: 2.36 — ABNORMAL HIGH (ref 0.00–1.49)
INR: 2.49 — AB (ref 0.00–1.49)
Prothrombin Time: 21.9 seconds — ABNORMAL HIGH (ref 11.6–15.2)
Prothrombin Time: 25 seconds — ABNORMAL HIGH (ref 11.6–15.2)
Prothrombin Time: 26.1 seconds — ABNORMAL HIGH (ref 11.6–15.2)

## 2013-04-14 LAB — HEPARIN LEVEL (UNFRACTIONATED)
HEPARIN UNFRACTIONATED: 0.38 [IU]/mL (ref 0.30–0.70)
HEPARIN UNFRACTIONATED: 0.21 [IU]/mL — AB (ref 0.30–0.70)
Heparin Unfractionated: 0.42 IU/mL (ref 0.30–0.70)
Heparin Unfractionated: 0.59 IU/mL (ref 0.30–0.70)

## 2013-04-14 LAB — CBC
HCT: 30.9 % — ABNORMAL LOW (ref 36.0–46.0)
HEMOGLOBIN: 10 g/dL — AB (ref 12.0–15.0)
MCH: 30.4 pg (ref 26.0–34.0)
MCHC: 32.4 g/dL (ref 30.0–36.0)
MCV: 93.9 fL (ref 78.0–100.0)
Platelets: 184 10*3/uL (ref 150–400)
RBC: 3.29 MIL/uL — ABNORMAL LOW (ref 3.87–5.11)
RDW: 16 % — AB (ref 11.5–15.5)
WBC: 7.3 10*3/uL (ref 4.0–10.5)

## 2013-04-14 LAB — GLUCOSE, CAPILLARY
GLUCOSE-CAPILLARY: 155 mg/dL — AB (ref 70–99)
Glucose-Capillary: 154 mg/dL — ABNORMAL HIGH (ref 70–99)
Glucose-Capillary: 140 mg/dL — ABNORMAL HIGH (ref 70–99)
Glucose-Capillary: 141 mg/dL — ABNORMAL HIGH (ref 70–99)

## 2013-04-14 MED ORDER — WARFARIN SODIUM 5 MG PO TABS
5.0000 mg | ORAL_TABLET | Freq: Once | ORAL | Status: AC
  Administered 2013-04-14: 5 mg via ORAL
  Filled 2013-04-14: qty 1

## 2013-04-14 MED ORDER — WARFARIN SODIUM 5 MG PO TABS: 5.0000 mg | ORAL_TABLET | Freq: Once | ORAL | Status: DC

## 2013-04-14 NOTE — Plan of Care (Signed)
Problem: Problem: IV to PO Meds Progression Goal: OTHER IV TO PO MEDS GOAL(S) Outcome: Progressing Continues on IV heparin

## 2013-04-14 NOTE — Progress Notes (Signed)
Patient Name: [name] Date of Encounter: 04/14/2013  Principal Problem:   Chest pain- with tachycardia Active Problems:   DIABETES MELLITUS, TYPE II   GERD   End stage renal disease on dialysis-TTS   Palpitations- run of tachycardia after HD, lasted 15-20 minutes   S/P AVR   Chronic anticoagulation w/ coumadin, goal INR 2.5-3.0 w/ AVR    SUBJECTIVE: Feels well, no chest pain or SOB. Will go home from here, her sister is coming to stay with her for a couple of weeks.  OBJECTIVE Filed Vitals:   04/13/13 2113 04/14/13 0550 04/14/13 0951 04/14/13 1047  BP: 101/54 116/40  110/66  Pulse:  75  81  Temp:  97.8 F (36.6 C)    TempSrc:  Oral    Resp:  18    Height:      Weight:  162 lb 7.7 oz (73.7 kg)    SpO2:  100% 100%    No intake or output data in the 24 hours ending 04/14/13 1230 Filed Weights   04/13/13 0702 04/13/13 1104 04/14/13 0550  Weight: 163 lb 2.3 oz (74 kg) 158 lb 8.2 oz (71.9 kg) 162 lb 7.7 oz (73.7 kg)    PHYSICAL EXAM General: Well developed, well nourished, female in no acute distress. Head: Normocephalic, atraumatic.  Neck: Supple without bruits, JVD not elevated Lungs:  Resp regular and unlabored, few rales bases. Heart: RRR, S1, S2, no S3, S4, crisp valve click, harsh murmur; no rub. Abdomen: Soft, non-tender, non-distended, BS + x 4.  Extremities: No clubbing, cyanosis, no edema.  Neuro: Alert and oriented X 3. Moves all extremities spontaneously. Psych: Normal affect.  LABS: CBC:  Recent Labs  04/13/13 0500 04/14/13 0108  WBC 6.7 7.3  HGB 9.3* 10.0*  HCT 29.1* 30.9*  MCV 93.6 93.9  PLT 185 184   INR:  Recent Labs  04/14/13 1000  INR 9.02*   Basic Metabolic Panel:  Recent Labs  04/11/13 2008  NA 136*  K 4.7  CL 94*  CO2 23  GLUCOSE 200*  BUN 57*  CREATININE 10.06*  CALCIUM 8.1*  PHOS 5.0*   Liver Function Tests:  Recent Labs  04/11/13 2008  AST 20  ALT 14  ALKPHOS 95  BILITOT 0.3  PROT 6.9  ALBUMIN  2.5*    TELE:   SR, rare ST. No significant ectopy  Current Medications:  . aspirin EC  81 mg Oral Daily  . budesonide-formoterol  2 puff Inhalation BID  . calcium carbonate  1 tablet Oral Daily  . cinacalcet  30 mg Oral Q breakfast  . darbepoetin (ARANESP) injection - DIALYSIS  100 mcg Intravenous Q Mon-HD  . docusate sodium  100 mg Oral Daily  . doxercalciferol  4 mcg Intravenous Q M,W,F-HD  . DULoxetine  60 mg Oral QHS  . gabapentin  300 mg Oral QHS  . insulin aspart  0-9 Units Subcutaneous TID WC  . levothyroxine  150 mcg Oral QAC breakfast  . LORazepam  0.5 mg Oral QHS  . metoprolol tartrate  50 mg Oral BID  . multivitamin  1 tablet Oral QHS  . nicotine  7 mg Transdermal Daily  . pantoprazole  40 mg Oral Daily  . senna  1 tablet Oral QPM  . tacrolimus  5 mg Oral BID  . Warfarin - Pharmacist Dosing Inpatient   Does not apply q1800   . heparin 1,200 Units/hr (04/14/13 1043)    ASSESSMENT AND PLAN: 58 yo  female w/ CABG (LIMA-LAD) & AVR 02/13. D/c to SNF for rehab 02/17. , ESRD on HD, liver dz (transplant), DM, HTN admitted 03/01 w/ chest pain/palps.  Principal Problem:   Chest pain- with tachycardia: felt secondary to arrhythmia, ez negative, no cath/stress planned.  Active Problems:   DIABETES MELLITUS, TYPE II - on SSI    GERD - continue Protonix    End stage renal disease on dialysis-TTS: managed by renal team    Palpitations- run of tachycardia after HD, lasted 15-20 minutes. HR reported 165, spontaneous conversion to SR, HR 70s. TSH nl Jan 2015. Mtoprolol increased 25-->50 mg BID, no doses held.    S/P AVR: s/p Echo 03/01: A 28mm[employer]ics mechanical prosthesis was present. Gradients are in normal range for this type of prosthesis. Cannot exclude small perivalvular leak versus eccentric aortic regurgitation. Mean gradient: 69mm Hg (S). Peak gradient: 58mm Hg (S).    Chronic anticoagulation w/ coumadin, goal INR 2.5-3.0 w/ AVR: Heparin to coumadin per pharmacy,  not at goal yet.   Plan: d/c when INR >/= 2.5  Signed, Rosaria Ferries , PA-C 12:30 PM 04/14/2013    Patient seen and examined. Agree with assessment and plan. INR today 2.36.  BP stable; tolerating beta blocker dose. Will plan DC tomorrow       Troy Sine, MD, [facility] 04/14/2013 12:41 PM

## 2013-04-14 NOTE — Progress Notes (Signed)
ANTICOAGULATION CONSULT NOTE - Follow Up Consult  Pharmacy Consult for Heparin / Coumadin Indication: AVR  Allergies  Allergen Reactions  . Acetaminophen Other (See Comments)    Liver transplant recipient   . Codeine Itching    Patient Measurements: Height: 5\' 4"  (162.6 cm) Weight: 162 lb 7.7 oz (73.7 kg) IBW/kg (Calculated) : 54.7 Heparin Dosing Weight: 70 kg  Vital Signs: Temp: 97.8 F (36.6 C) (03/05 0550) Temp src: Oral (03/05 0550) BP: 110/66 mmHg (03/05 1047) Pulse Rate: 81 (03/05 1047)  Labs:  Recent Labs  04/11/13 2008  04/12/13 0700 04/13/13 0500  04/14/13 0108 04/14/13 0341 04/14/13 1000  HGB 9.5*  --  9.7* 9.3*  --  10.0*  --   --   HCT 28.4*  --  29.7* 29.1*  --  30.9*  --   --   PLT 201  --  198 185  --  184  --   --   LABPROT  --   < > 20.0* 20.7*  --  21.9*  --  25.0*  INR  --   < > 1.76* 1.84*  --  1.98*  --  2.36*  HEPARINUNFRC  --   < > 0.50 0.20*  < > 0.38 0.42 0.21*  CREATININE 10.06*  --   --   --   --   --   --   --   < > = values in this interval not displayed.  Estimated Creatinine Clearance: 6 ml/min (by C-G formula based on Cr of 10.06).  Assessment: 59 yo female with AVR is currently on heparin and coumadin bridge for AVR and afib.[name]  Heparin level is 0.21 this morning because her IV came out for an unknown amount of time per her RN.  Her IV was restarted at 0821 am.  Her previous HL on this rate of 1200 units/hr was tx at 0.38 and 0.42.  Her INR today is 2.36.   INR 2.36  after 4 - 4 - 6 - 7.5 mg doses of coumadin.  Home dose of coumadin was 2 mg po daily.  Hg 10, got aranesp 100 mcg Monday w/ HD.  PLT 184.  No bleeding reported. INR goal 2.5 - 3.5.   Tacrolimus level from 3/1 resulted as 6.1 ng/ml;  Per comments from [facility] this is therapeutic.   Goal of Therapy: ; Heparin level 0.3-0.7 units/ml Monitor platelets by anticoagulation protocol: Yes INR = 2.5 to 3.5   Plan:  1) continue heparin at 1200 units/hr and recheck check HL  in 8 hrs (HL low due to IV came out)  2) recheck INR with 1600 HL,  We are waiting for INR to be > = 2.5 for discharge, don't want to overshoot  (prior home dose 2 mg qday) 3) daily HL, INR, CBC Eudelia Bunch, Pharm.D. [phone] 04/14/2013 12:33 PM

## 2013-04-14 NOTE — Progress Notes (Signed)
ANTICOAGULATION CONSULT NOTE - Follow Up Consult  Pharmacy Consult for Heparin / Coumadin Indication: AVR  Allergies  Allergen Reactions  . Acetaminophen Other (See Comments)    Liver transplant recipient   . Codeine Itching   Patient Measurements: Height: 5\' 4"  (162.6 cm) Weight: 162 lb 7.7 oz (73.7 kg) IBW/kg (Calculated) : 54.7 Heparin Dosing Weight: 70 kg  Vital Signs: Temp: 97.8 F (36.6 C) (03/05 1326) Temp src: Oral (03/05 1326) BP: 114/68 mmHg (03/05 1326) Pulse Rate: 73 (03/05 1326)  Labs:  Recent Labs  04/11/13 2008  04/12/13 0700 04/13/13 0500  04/14/13 0108 04/14/13 0341 04/14/13 1000 04/14/13 1705  HGB 9.5*  --  9.7* 9.3*  --  10.0*  --   --   --   HCT 28.4*  --  29.7* 29.1*  --  30.9*  --   --   --   PLT 201  --  198 185  --  184  --   --   --   LABPROT  --   < > 20.0* 20.7*  --  21.9*  --  25.0* 26.1*  INR  --   < > 1.76* 1.84*  --  1.98*  --  2.36* 2.49*  HEPARINUNFRC  --   < > 0.50 0.20*  < > 0.38 0.42 0.21* 0.59  CREATININE 10.06*  --   --   --   --   --   --   --   --   < > = values in this interval not displayed.  Estimated Creatinine Clearance: 6 ml/min (by C-G formula based on Cr of 10.06).  Assessment: 58 yo female with AVR is currently on heparin and coumadin bridge for AVR and afib. Heparin level is 0.59 and INR is 2.49 this evening. Waiting for INR to be >2.5 before sending home, will likely be there in the morning.  No bleeding noted.  Goal of Therapy: ; Heparin level 0.3-0.7 units/ml Monitor platelets by anticoagulation protocol: Yes INR 2.5- 3.5   Plan:  1. Continue heparin at 1200 units/hr 2. Warfarin 5mg  po x1 tonight (home dose was 2mg  daily. Likely will need a larger dose when she is discharged.) 3. Daily PT/INR, HL and CBC 4. Follow for s/s bleeding  Jarrid Lienhard D. Khalaya Mcgurn, PharmD, BCPS Clinical Pharmacist Pager: [phone] 04/14/2013 7:05 PM

## 2013-04-14 NOTE — Progress Notes (Signed)
Subjective:  Seen on dialysis , no complaints- INR 1.98 this AM- tolerated HD yesterday Objective Vital signs in last 24 hours: Filed Vitals:   04/13/13 2042 04/13/13 2044 04/13/13 2113 04/14/13 0550  BP: 94/56  101/54 116/40  Pulse: 79   75  Temp: 98.6 F (37 C)   97.8 F (36.6 C)  TempSrc: Oral   Oral  Resp: 15   18  Height:      Weight:    73.7 kg (162 lb 7.7 oz)  SpO2: 100% 100%  100%   Weight change:   Intake/Output Summary (Last 24 hours) at 04/14/13 0827 Last data filed at 04/13/13 1104  Gross per 24 hour  Intake      0 ml  Output   2000 ml  Net  -2000 ml   Dialyzes at [facility] on MWF . EDW 72kg.  HD Bath 2K, 2 Ca, Dialyzer 190NR, Heparin Strd. Access L thigh AVG    Assessment/ Plan: Pt is a 58 y.o. yo female with ESRD who was admitted on 04/09/2013 with chest pain and palpitations  Assessment/Plan: 1. CP and palpitations- in setting of CABG/valve replacement-  per cards, work up seems to be negative, awaiting therapeutic INR 2. ESRD - normally [name] MWF via thigh AVG- continue schedule while here 3. Anemia- hgb 10-  on aranesp 4. Secondary hyperparathyroidism- cont tums/sensipar/hectorol 5. HTN/volume- on lopressor- BP good- at EDW 6. S/p liver tx- on prograf 7. Dispo- i assume home once INR threrapeutic- INR is 1.98 today- decision of whether to discharge up to cards.  I will write dialysis orders for first shift if needs to stay til tomorrow   [place]: Basic Metabolic Panel:  Recent Labs Lab 04/10/13 0125 04/10/13 0734 04/11/13 2008  NA 141 143 136*  K 3.4* 3.7 4.7  CL 96 98 94*  CO2 25 28 23   GLUCOSE 196* 158* 200*  BUN 34* 37* 57*  CREATININE 7.53* 8.02* 10.06*  CALCIUM 8.6 8.7 8.1*  PHOS  --   --  5.0*   Liver Function Tests:  Recent Labs Lab 04/10/13 0734 04/11/13 2008  AST 23 20  ALT 14 14  ALKPHOS 95 95  BILITOT 0.3 0.3  PROT 6.7 6.9  ALBUMIN 2.6* 2.5*   No results found for this basename: LIPASE, AMYLASE,   in the last 168 hours No results found for this basename: AMMONIA,  in the last 168 hours CBC:  Recent Labs Lab 04/10/13 0125  04/11/13 0456 04/11/13 2008 04/12/13 0700 04/13/13 0500 04/14/13 0108  WBC 6.6  < > 7.3 7.3 6.3 6.7 7.3  NEUTROABS 3.5  --   --   --   --   --   --   HGB 9.6*  < > 9.3* 9.5* 9.7* 9.3* 10.0*  HCT 29.9*  < > 28.3* 28.4* 29.7* 29.1* 30.9*  MCV 93.7  < > 92.8 92.2 92.8 93.6 93.9  PLT 159  < > 183 201 198 185 184  < > = values in this interval not displayed. Cardiac Enzymes:  Recent Labs Lab 04/10/13 0125 04/10/13 0735 04/10/13 1403 04/10/13 1958  TROPONINI <0.30 <0.30 <0.30 <0.30   CBG:  Recent Labs Lab 04/12/13 2035 04/13/13 1141 04/13/13 1654 04/13/13 2059 04/14/13 0743  GLUCAP 173* 178* 198* 215* 154*    Iron Studies: No results found for this basename: IRON, TIBC, TRANSFERRIN, FERRITIN,  in the last 72 hours Studies/Results: No results found. Medications: Infusions: . heparin 1,200 Units/hr (04/14/13  0821)    Scheduled Medications: . aspirin EC  81 mg Oral Daily  . budesonide-formoterol  2 puff Inhalation BID  . calcium carbonate  1 tablet Oral Daily  . cinacalcet  30 mg Oral Q breakfast  . darbepoetin (ARANESP) injection - DIALYSIS  100 mcg Intravenous Q Mon-HD  . docusate sodium  100 mg Oral Daily  . doxercalciferol  4 mcg Intravenous Q M,W,F-HD  . DULoxetine  60 mg Oral QHS  . gabapentin  300 mg Oral QHS  . insulin aspart  0-9 Units Subcutaneous TID WC  . levothyroxine  150 mcg Oral QAC breakfast  . LORazepam  0.5 mg Oral QHS  . metoprolol tartrate  50 mg Oral BID  . multivitamin  1 tablet Oral QHS  . nicotine  7 mg Transdermal Daily  . pantoprazole  40 mg Oral Daily  . senna  1 tablet Oral QPM  . tacrolimus  5 mg Oral BID  . Warfarin - Pharmacist Dosing Inpatient   Does not apply q1800    have reviewed scheduled and prn medications.  Physical Exam: General: NAD Heart: RRR Lungs: moslty clear Abdomen: soft, non  tender Extremities: no edema Dialysis Access: thigh AVG    04/14/2013,8:27 AM  LOS: 5 days

## 2013-04-14 NOTE — Progress Notes (Signed)
ANTICOAGULATION CONSULT NOTE - Follow Up Consult  Pharmacy Consult for heparin Indication: AVR  Labs:  Recent Labs  04/11/13 2008 04/12/13 0700 04/13/13 0500 04/13/13 1440 04/14/13 0108  HGB 9.5* 9.7* 9.3*  --  10.0*  HCT 28.4* 29.7* 29.1*  --  30.9*  PLT 201 198 185  --  184  LABPROT  --  20.0* 20.7*  --  21.9*  INR  --  1.76* 1.84*  --  1.98*  HEPARINUNFRC  --  0.50 0.20* 0.26* 0.38  CREATININE 10.06*  --   --   --   --     Assessment/Plan:  58yo female now therapeutic on heparin after rate adjustments.  Will continue gtt at current rate and confirm stable with additional level.  Wynona Neat, PharmD, BCPS  04/14/2013,2:10 AM

## 2013-04-14 NOTE — Progress Notes (Signed)
Came to visit patient at bedside to offer and discuss [employer] services. She is agreeable. Consents were signed. She will receive post hospital discharge call and will be evaluated for monthly home visits. Confirmed best contact information. Left [facility] Care Management packet and contact information at bedside.  [name], MSN- [facility] [phone]

## 2013-04-15 LAB — CBC
HCT: 30.4 % — ABNORMAL LOW (ref 36.0–46.0)
Hemoglobin: 9.9 g/dL — ABNORMAL LOW (ref 12.0–15.0)
MCH: 30.7 pg (ref 26.0–34.0)
MCHC: 32.6 g/dL (ref 30.0–36.0)
MCV: 94.1 fL (ref 78.0–100.0)
PLATELETS: 170 10*3/uL (ref 150–400)
RBC: 3.23 MIL/uL — AB (ref 3.87–5.11)
RDW: 16.2 % — ABNORMAL HIGH (ref 11.5–15.5)
WBC: 6.9 10*3/uL (ref 4.0–10.5)

## 2013-04-15 LAB — HEPARIN LEVEL (UNFRACTIONATED): Heparin Unfractionated: 0.69 IU/mL (ref 0.30–0.70)

## 2013-04-15 LAB — GLUCOSE, CAPILLARY: GLUCOSE-CAPILLARY: 139 mg/dL — AB (ref 70–99)

## 2013-04-15 LAB — PROTIME-INR
INR: 3.19 — ABNORMAL HIGH (ref 0.00–1.49)
PROTHROMBIN TIME: 31.5 s — AB (ref 11.6–15.2)

## 2013-04-15 MED ORDER — DOXERCALCIFEROL 4 MCG/2ML IV SOLN
INTRAVENOUS | Status: AC
  Filled 2013-04-15: qty 2

## 2013-04-15 MED ORDER — WARFARIN SODIUM 2 MG PO TABS: ORAL_TABLET | ORAL | Status: DC

## 2013-04-15 MED ORDER — HEPARIN SODIUM (PORCINE) 1000 UNIT/ML DIALYSIS: 20.0000 [IU]/kg | INTRAMUSCULAR | Status: DC | PRN

## 2013-04-15 MED ORDER — METOPROLOL TARTRATE 50 MG PO TABS: 50.0000 mg | ORAL_TABLET | Freq: Two times a day (BID) | ORAL | Status: DC

## 2013-04-15 NOTE — Discharge Instructions (Signed)
Supraventricular Tachycardia Supraventricular tachycardia (SVT) is when the heart beats very fast. SVT can last for a long time (sustained) or it can start and stop suddenly (nonsustained). HOME CARE   Take your heart medicine as told by your doctor. Check with your doctor before taking cold, diet, or herbal medicine.  Do not smoke.  Do not drink large amounts of caffeine.Caffeine is found in coffee, tea, soda (pop, cola), and chocolate.  Keep all doctor visits as told. GET HELP RIGHT AWAY IF:   You have chest pain or pressure.  You cannot catch your breath.  You are dizzy or lightheaded.  You feel like you will pass out (faint).  You are sweaty (diaphoretic) and feel sick to your stomach (nauseous) or throw up (vomit).  If you have the above problems, call your local emergency services (911 in [place] right away. Do not drive yourself to the hospital. MAKE SURE YOU:   Understand these instructions.  Will watch your condition.  Will get help right away if you are not doing well or get worse. Document Released: 01/27/2005 Document Revised: 04/21/2011 Document Reviewed: 05/03/2008 [facility] Patient Information 2014 [place].    Anticoagulation, Generic Anticoagulants are medications used to prevent clots from developing in your veins. These medications are also known as blood thinners. If blood clots are untreated, they could travel to your lungs. This is called a pulmonary embolus. A blood clot in your lungs can be fatal.  Caregivers often use anticoagulants to prevent clots following surgery. Anticoagulants are also used along with aspirin when the heart is not getting enough blood. Another anticoagulant called warfarin is started 2 to 3 days after a rapid-acting injectable anticoagulant is started. The rapid-acting anticoagulants are usually continued until warfarin has begun to work. Your caregiver will judge this length of time by blood tests known as the prothrombin  time (PT) and International Normalization Ratio (INR). This means that your blood is at the necessary and best level to prevent clots. RISKS AND COMPLICATIONS  If you have received recent epidural anesthesia, spinal anesthesia, or a spinal tap while receiving anticoagulants, you are at risk for developing a blood clot in or around the spine. This condition could result in long-term or permanent paralysis.  Because anticoagulants thin your blood, severe bleeding may occur from any tissue or organ. Symptoms of the blood being too thin may include:  Bleeding from the nose or gums that does not stop quickly.  Unusual bruising or bruising easily.  Swelling or pain at an injection site.  A cut that does not stop bleeding within 10 minutes.  Continual nausea for more than 1 day or vomiting blood.  Coughing up blood.  Blood in the urine which may appear as pink, red, or brown urine.  Blood in bowel movements which may appear as red, dark or black stools.  Sudden weakness or numbness of the face, arm, or leg, especially on one side of the body.  Sudden confusion.  Trouble speaking (aphasia) or understanding.  Sudden trouble seeing in one or both eyes.  Sudden trouble walking.  Dizziness.  Loss of balance or coordination.  Severe pain, such as a headache, joint pain, or back pain.  Fever.  Too little anticoagulation continues to allow the risk for blood clots. HOME CARE INSTRUCTIONS   Due to the complications of anticoagulants, it is very important that you take your anticoagulant as directed by your caregiver. Anticoagulants need to be taken exactly as instructed. Be sure you understand all  your anticoagulant instructions.  Warfarin. Your caregiver will advise you on the length of treatment (usually 3 6 months, sometimes lifelong).  Take warfarin exactly as directed by your caregiver. It is recommended that you take your warfarin dose at the same time of the day. It is  preferred that you take warfarin in the late afternoon. If you have been told to stop taking warfarin, do not resume taking warfarin until directed to do so by your caregiver. Follow your caregiver's instructions if you accidentally take an extra dose or miss a dose of warfarin. It is very important to take warfarin as directed since bleeding or blood clots could result in chronic or permanent injury, pain, or disability.  Too much and too little warfarin are both dangerous. Too much warfarin increases the risk of bleeding. Too little warfarin continues to allow the risk for blood clots. While taking warfarin, you will need to have regular blood tests to measure your blood clotting time. These blood tests usually include both the PT and INR tests. The PT and INR results allow your caregiver to adjust your dose of warfarin. The dose can change for many reasons. It is critically important that you take warfarin exactly as prescribed, and that you have your PT and INR levels drawn exactly as directed. Follow up with your laboratory test appointments as directed. It is very important to keep your lab appointments. Not keeping lab appointments could result in a chronic or permanent injury, pain, or disability.  Many foods, especially foods high in vitamin K can interfere with warfarin and affect the PT and INR results. Foods high in vitamin K include spinach, kale, broccoli, cabbage, collard and turnip greens, brussels sprouts, peas, cauliflower, seaweed, and parsley as well as beef and pork liver, green tea, and soybean oil. You should eat a consistent amount of foods high in vitamin K. Avoid major changes in your diet, or notify your caregiver before changing your diet. Arrange a visit with a dietitian to answer your questions.  Many medicines can interfere with warfarin and affect the PT and INR results. You must tell your caregiver about any and all medicines you take, this includes all vitamins and  supplements. Ask your caregiver before taking these. Prescription and over-the-counter medicine consistency is critical to warfarin management. It is important that potential interactions are checked before you start a new medicine. Be especially cautious with aspirin and anti-inflammatory medicines. Ask your caregiver before taking these. Medicines such as antibiotics and acid-reducing medicine can interact with warfarin and can cause an increased warfarin effect. Warfarin can also interfere with the effectiveness of medicines you are taking. Do not take or discontinue any prescribed or over-the-counter medicine except on the advice of your caregiver or pharmacist.  Some vitamins, supplements, and herbal products interfere with the effectiveness of warfarin. Vitamin E may increase the anticoagulant effects of warfarin. Vitamin K may can cause warfarin to be less effective. Do not take or discontinue any vitamin, supplement, or herbal product except on the advice of your caregiver or pharmacist.  Alcohol can change the body's ability to handle warfarin. It is best to avoid alcoholic drinks or consume only very small amounts while taking warfarin. Notify your caregiver if you change your alcohol intake. A sudden increase in alcohol use can increase your risk of bleeding. Chronic alcohol use can cause warfarin to be less effective.  If you have a loss of appetite or get the stomach flu (viral gastroenteritis), talk to your caregiver as  soon as possible. A decrease in your normal vitamin K intake can make you more sensitive to your usual dose of warfarin.  Some medical conditions may increase your risk for bleeding while you are taking warfarin. A fever, diarrhea lasting more than a day, worsening heart failure, or worsening liver function are some medical conditions that could affect warfarin. Contact your caregiver if you have any of these medical conditions.  Warfarin can have side effects, such as  excessive bruising or bleeding. You will need to hold pressure over cuts for longer than usual.  Be careful not to cut yourself when using sharp objects.  Notify your dentist or other caregivers before procedures.  Limit physical activities or sports that could result in a fall or cause injury. Avoid contact sports.  Wear a medical alert bracelet or carry a medical alert card. SEEK MEDICAL CARE IF:   You develop any rashes.  You have any worsening of the condition for which you are receiving anticoagulation therapy. SEEK IMMEDIATE MEDICAL CARE IF:   Bleeding from the nose or gums does not stop quickly.  You have unusual bruising or are bruising easily.  Swelling or pain occurs at an injection site.  A cut does not stop bleeding within 10 minutes.  You have continual nausea for more than 1 day or are vomiting blood.  You are coughing up blood.  You have blood in the urine.  You have dark or black stools.  You have sudden weakness or numbness of the face, arm, or leg, especially on one side of the body.  You have sudden confusion.  You have trouble speaking (aphasia) or understanding.  You have sudden trouble seeing in one or both eyes.  You have sudden trouble walking.  You have dizziness.  You have a loss of balance or coordination.  You have severe pain, such as a headache, joint pain, or back pain.  You have a serious fall or head injury, even if you are not bleeding.  You have an oral temperature above 102 F (38.9 C), not controlled by medicine. ANY OF THESE SYMPTOMS MAY REPRESENT A SERIOUS PROBLEM THAT IS AN EMERGENCY. Do not wait to see if the symptoms will go away. Get medical help right away. Call your local emergency services (911 in [place] DO NOT drive yourself to the hospital. MAKE SURE YOU:   Understand these instructions.  Will watch your condition.  Will get help right away if you are not doing well or get worse. Document Released: 01/27/2005  Document Revised: 10/22/2011 Document Reviewed: 09/01/2007 [facility] Patient Information 2014 [place].  Chest Pain (Nonspecific) Chest pain has many causes. Your pain could be caused by something serious, such as a heart attack or a blood clot in the lungs. It could also be caused by something less serious, such as a chest bruise or a virus. Follow up with your doctor. More lab tests or other studies may be needed to find the cause of your pain. Most of the time, nonspecific chest pain will improve within 2 to 3 days of rest and mild pain medicine. HOME CARE  For chest bruises, you may put ice on the sore area for 15-20 minutes, 03-04 times a day. Do this only if it makes you feel better.  Put ice in a plastic bag.  Place a towel between the skin and the bag.  Rest for the next 2 to 3 days.  Go back to work if the pain improves.  See your  doctor if the pain lasts longer than 1 to 2 weeks.  Only take medicine as told by your doctor.  Quit smoking if you smoke. GET HELP RIGHT AWAY IF:   There is more pain or pain that spreads to the arm, neck, jaw, back, or belly (abdomen).  You have shortness of breath.  You cough more than usual or cough up blood.  You have very bad back or belly pain, feel sick to your stomach (nauseous), or throw up (vomit).  You have very bad weakness.  You pass out (faint).  You have a fever. Any of these problems may be serious and may be an emergency. Do not wait to see if the problems will go away. Get medical help right away. Call your local emergency services 911 in [place] Do not drive yourself to the hospital. MAKE SURE YOU:   Understand these instructions.  Will watch this condition.  Will get help right away if you or your child is not doing well or gets worse. Document Released: 07/16/2007 Document Revised: 04/21/2011 Document Reviewed: 07/16/2007 [facility] Patient Information 2014 [place], [place].  Warfarin: What You Need to  Know Warfarin is an anticoagulant. Anticoagulants help prevent the formation of blood clots. They also help stop the growth of blood clots. Warfarin is sometimes referred to as a "blood thinner."  Normally, when body tissues are cut or damaged, the blood clots in order to prevent blood loss. Sometimes clots form inside your blood vessels and obstruct the flow of blood through your circulatory system (thrombosis). These clots may travel through your bloodstream and become lodged in smaller blood vessels in your brain, which can cause a stroke, or your lungs (pulmonary embolism). WHO SHOULD USE WARFARIN? Warfarin is prescribed for people at risk of developing harmful blood clots:  People with surgically implanted mechanical heart valves, irregular heart rhythms called atrial fibrillation, and certain clotting disorders.  People who have developed harmful blood clotting in the past, including those who have had a stroke or a pulmonary embolism, or thrombosis in their legs (deep vein thrombosis [DVT]).  People with an existing blood clot such as a pulmonary embolism. WARFARIN DOSING Warfarin tablets come in different strengths. Each tablet strength is a different color, with the amount of warfarin (in milligrams) clearly printed on the tablet. If the color of your tablet is different than usual when you receive a new prescription, report it immediately to your pharmacist or health care provider. WARFARIN MONITORING The goal of warfarin therapy is to lessen the clotting tendency of blood but not to prevent clotting completely. Your health care provider will monitor the anticoagulation effect of warfarin closely and adjust your dose as needed. For your safety, blood tests called prothrombin time (PT) or international normalized ratio (INR) are used to measure the effects of warfarin. Both of these tests can be done with a finger stick or a blood draw. The longer it takes the blood to clot, the higher the PT  or INR. Your health care provider will inform you of your "target" PT or INR range. If, at any time, your PT or INR is above the target range, there is a risk of bleeding. If your PT or INR is below the target range, there is a risk of clotting. Whether you are started on warfarin while you are in the hospital, or in your health care provider's office, you will need to have your PT or INR checked within one week of starting the medicine. Initially, some people  are asked to have their PT or INR checked as much as twice a week. Once you are on a stable maintenance dose, the PT or INR is checked less often, usually once every 2 to 4 weeks. The warfarin dose may be adjusted if the PT or INR is not within the target range. It is important to keep all laboratory and health care provider follow-up appointments.  WHAT ARE THE SIDE EFFECTS OF WARFARIN?  Too much warfarin can cause bleeding (hemorrhage) from any part of the body. This may include bleeding from the gums, blood in the urine, bloody or dark stools, a nosebleed that is not easily stopped, coughing up blood, or vomiting blood.  Too little warfarin can increase the risk of blood clots.  Too little or too much warfarin can also increase the risk of a stroke.  Warfarin use may cause a skin rash or irritation, an unusual fever, continual nausea or stomach upset, or severe pain in your joints or back. SPECIAL PRECAUTIONS WHILE TAKING WARFARIN Warfarin should be taken exactly as directed:  Take your medicine at the same time every day. If you forget to take your dose, you can take it if it is within 6 hours of when it was due.  Do not change the dose of warfarin on your own to make up for missed or extra doses.  If you miss more than 2 doses in a row, you should contact your health care provider for advice. Avoid situations that cause bleeding. You may have a tendency to bleed more easily than usual while taking warfarin. The following actions can  limit bleeding:  Using a softer toothbrush.  Flossing with waxed floss rather than unwaxed floss.  Shaving with an Copy rather than a blade.  Limiting the use of sharp objects.  Avoiding potentially harmful activities such as contact sports. Warfarin and Pregnancy or Breastfeeding  Warfarin is not advised during the first trimester of pregnancy due to an increased risk of birth defects. In certain situations, a woman may take warfarin after her first trimester of pregnancy. A woman who becomes pregnant or plans to become pregnant while taking warfarin should notify her health care provider immediately.  Although warfarin does not pass into breast milk, a woman who wishes to breastfeed while taking warfarin should also consult with her health care provider. Alcohol, Smoking, and Illicit Drug Use  Alcohol affects how warfarin works in the body. It is best to avoid alcoholic drinks or consume very small amounts while taking warfarin. In general, alcohol intake should be limited to 1 oz (30 mL) of liquor, 6 oz (180 mL) of wine, or 12 oz (360 mL) of beer each day. Notify your health care provider if you change your alcohol intake.  Smoking affects how warfarin works. It is best to avoid smoking while taking warfarin. Notify your health care provider if you change your smoking habits.  It is best to avoid all illicit drugs while taking warfarin since there are few studies that show how warfarin interacts with these drugs. Other Medicines and Dietary Supplements Many prescription and over-the-counter medicines can interfere with warfarin. Be sure all of your health care providers know you are taking warfarin. Notify your health care provider who prescribed warfarin for you before starting or stopping any new medicines, including over-the-counter vitamins, dietary supplements, and pain medicines. Your warfarin dose may need to be adjusted. Some common over-the-counter medicines that may  increase the risk of bleeding while taking warfarin include:  Acetaminophen.  Aspirin.  Nonsteroidal anti-inflammatory medicines such as ibuprofen or naproxen.  Vitamin E. Dietary Considerations  Foods that have moderate or high amounts of vitamin K can interfere with warfarin. Avoid major changes in your diet or notify your health care provider before changing your diet. Eat a consistent amount of foods that have moderate or high amounts of vitamin K.Eating less foods containing vitamin K can increase the risk of bleeding. Eating more foods containing vitamin K can increase the risk of blood clots. Additional questions about dietary considerations can be discussed with a dietitian. The serving size for foods containing moderate or high amounts of vitamin K are  cup cooked (120 mL or noted gram weight) or 1 cup raw (240 mL or noted gram weight), unless otherwise noted. These foods include: Proteins  Beef liver, 3.5 oz (100 g).  Pork liver, 3.5 oz (100 g). Legumes  Soybean oil.  Soybeans.  Garbanzo beans.  Green peas.  Black-eyed peas. Leafy green vegetables  Kale.  Spinach.  Nettle greens.  Swiss chard.  Watercress.  Endive.  Parsley, 1 tbsp (4 g).  Turnip greens.  Collard greens.  Seaweed, limit 2 sheets.  Beet greens.  Dandelion greens.  Mustard greens.  Green Lead and Romaine lettuce. Cruciferous vegetables  Broccoli.  Cabbage (green or [place]).  Brussels sprouts.  Cauliflower.  Asparagus. Miscellaneous  Onions, green onions, or spring onions.  Green tea made with  oz (14 g) or more of dried tea.  Herbal teas containing coumarin.  Spinach noodles.  Okra.  Prunes.  Angie Fava. CALL YOUR CLINIC OR HEALTH CARE PROVIDER IF YOU:  Plan to have any surgery or procedure.  Feel sick, especially if you have diarrhea or vomiting.  Experience or anticipate any major changes in your diet.  Start or stop a prescription or  over-the-counter medicine.  Become, plan to become, or think you may be pregnant.  Are having heavier than usual menstrual periods.  Have had a fall, accident, or any symptoms of bleeding or unusual bruising.  An unusual fever. CALL 911 IN THE [place] OR GO TO THE EMERGENCY DEPARTMENT IF YOU:   Think you may be having an allergic reaction to warfarin. The signs of an allergic reaction could include itching, rash, hives, swelling, chest tightness, or trouble breathing.  See signs of blood in your urine. The signs could include reddish, pinkish, or tea-colored urine.  See signs of blood in your stools. The signs could include bright red or black stools.  Vomit or cough up blood. In these instances, the blood could have either a bright red or a "coffee-grounds" appearance.  Have bleeding that will not stop after applying pressure for 30 minutes such as cuts, nosebleeds, other injuries.  Have severe pain in your joints or back.  Have a new and severe headache.  Have sudden weakness or numbness of your face, arm, or leg, especially on one side of your body.  Have sudden confusion or trouble understanding.  Have sudden trouble seeing in one or both eyes.  Have sudden trouble walking, dizziness, loss of balance, or coordination.  Have aphasia. Document Released: 01/27/2005 Document Revised: 10/22/2011 Document Reviewed: 07/23/2012 [facility] Patient Information 2014 [place].

## 2013-04-15 NOTE — Progress Notes (Signed)
      [address]       [place] [address]             [phone]     Asked by cardiology to check patients incisions. One of the chest tube sites does have some superficial fibrinous exudate and the incision has separated. There does not appear to be any evidence of infection. I debrided the area and covered with dry sterile dressing. Told her to clean daily gently with peroxide. She will contact us if worsens and has an appt at office on 04/20/13  Jaymie Misch E

## 2013-04-15 NOTE — Discharge Summary (Signed)
Patient ID: [name],  MRN: [id number], DOB/AGE: 59/01/1956 59 y.o.  Admit date: 04/09/2013 Discharge date: 04/15/2013  Primary Care Provider: Charlynn Court, NP Primary Cardiologist: Dr Corky Downs  Discharge Diagnoses Principal Problem:   Chest pain- with tachycardia Active Problems:   Palpitations- run of tachycardia after HD, lasted 15-20 minutes   DIABETES MELLITUS, TYPE II   End stage renal disease on dialysis-TTS   CAD- moderate with 50% D1, 20% LAD Jan. 2015   COPD (chronic obstructive pulmonary disease)   S/P mechanical AVR Feb 2015   Chronic anticoagulation w/ coumadin, goal INR 2.5-3.0 w/ AVR   HEPATITIS C- s/p liver transplant 2011   HYPOTHYROIDISM   HTN- LVH, EF45- 50%. grade 2 diastolic dysf. echo [id number]   GERD   Dyslipidemia-LDL 104, not on statin with Hx of liver transplant   Tobacco abuse    Hospital Course:  [name] is a 58 y/o woman with CAD s/p recent single-vessel CABG (LIMA -> LAD) and mechanical  AVR (03/15/13) by Dr. Prescott Gum. She also has ESRD with HD TTS, ESLD s/p liver transplant (2011), DM, HTN, HLD, anxiety, and tobacco use,. She presented 04/09/13  for further evaluation of chest pain and palpitations. She has had mild chest wall soreness since being d/c'ed on 03/28/13 to SNF for rehab. However, on 2/27 at ~1 PM, she noticed progressive worsening of the chest pain. She describes it as a non-radiating dull ache in the center of her chest. She also developed palpitations at that time. She endorses shortness of breath as well. She mentioned her discomfort to a nurse later in the afternoon; the nurse noted the patient to be quite tachycardic (HR ~150 bpm) and contact the patient's cardiac surgeon. They were instructed to attempt vagal maneuvers, which did not alleviate the patient's symptoms. EMS was subsequently summoned and found the patient to be tachycardic with a heart rate of 165 bpm. Upon standing to get to the stretcher, the patient's palpitations  resolved and she was found to have no normal heart rate in the 70's. The patient reports similar episodes prior to her AVR and CABG, with them typically occuring at or immediately after hemodialysis.              She was admitted to telemetry. Troponin were negative. Her INR was subtheraputic and pharmacy was consulted. Her daily Coumadin has been adjusted. She was covered with IV Heparin in the hospital She had some wound site drainage wand was seen by TCTS while hospitalized. She did have a documented episode of PSVT after HD on 04/14/13. Her beta blocker has been increased. She is felt to be stable for discharge 04/15/13. She'll follow up with Dr Evette Georges office in one to two weeks.    Discharge Vitals:  Blood pressure 123/63, pulse 78, temperature 97.8 F (36.6 C), temperature source Oral, resp. rate 20, height 5\' 4"  (1.626 m), weight 154 lb 8.7 oz (70.1 kg), SpO2 100.00%.    Labs: Results for orders placed during the hospital encounter of 04/09/13 (from the past 48 hour(s))  GLUCOSE, CAPILLARY     Status: Abnormal   Collection Time    04/13/13  4:54 PM      Result Value Ref Range   Glucose-Capillary 198 (*) 70 - 99 mg/dL  GLUCOSE, CAPILLARY     Status: Abnormal   Collection Time    04/13/13  8:59 PM      Result Value Ref Range   Glucose-Capillary 215 (*)  70 - 99 mg/dL  HEPARIN LEVEL (UNFRACTIONATED)     Status: None   Collection Time    04/14/13  1:08 AM      Result Value Ref Range   Heparin Unfractionated 0.38  0.30 - 0.70 IU/mL   Comment:            IF HEPARIN RESULTS ARE BELOW     EXPECTED VALUES, AND PATIENT     DOSAGE HAS BEEN CONFIRMED,     SUGGEST FOLLOW UP TESTING     OF ANTITHROMBIN III LEVELS.  CBC     Status: Abnormal   Collection Time    04/14/13  1:08 AM      Result Value Ref Range   WBC 7.3  4.0 - 10.5 K/uL   RBC 3.29 (*) 3.87 - 5.11 MIL/uL   Hemoglobin 10.0 (*) 12.0 - 15.0 g/dL   HCT 30.9 (*) 36.0 - 46.0 %   MCV 93.9  78.0 - 100.0 fL   MCH 30.4  26.0 - 34.0 pg     MCHC 32.4  30.0 - 36.0 g/dL   RDW 16.0 (*) 11.5 - 15.5 %   Platelets 184  150 - 400 K/uL  PROTIME-INR     Status: Abnormal   Collection Time    04/14/13  1:08 AM      Result Value Ref Range   Prothrombin Time 21.9 (*) 11.6 - 15.2 seconds   INR 1.98 (*) 0.00 - 1.49  HEPARIN LEVEL (UNFRACTIONATED)     Status: None   Collection Time    04/14/13  3:41 AM      Result Value Ref Range   Heparin Unfractionated 0.42  0.30 - 0.70 IU/mL   Comment:            IF HEPARIN RESULTS ARE BELOW     EXPECTED VALUES, AND PATIENT     DOSAGE HAS BEEN CONFIRMED,     SUGGEST FOLLOW UP TESTING     OF ANTITHROMBIN III LEVELS.  GLUCOSE, CAPILLARY     Status: Abnormal   Collection Time    04/14/13  7:43 AM      Result Value Ref Range   Glucose-Capillary 154 (*) 70 - 99 mg/dL  HEPARIN LEVEL (UNFRACTIONATED)     Status: Abnormal   Collection Time    04/14/13 10:00 AM      Result Value Ref Range   Heparin Unfractionated 0.21 (*) 0.30 - 0.70 IU/mL   Comment:            IF HEPARIN RESULTS ARE BELOW     EXPECTED VALUES, AND PATIENT     DOSAGE HAS BEEN CONFIRMED,     SUGGEST FOLLOW UP TESTING     OF ANTITHROMBIN III LEVELS.  PROTIME-INR     Status: Abnormal   Collection Time    04/14/13 10:00 AM      Result Value Ref Range   Prothrombin Time 25.0 (*) 11.6 - 15.2 seconds   INR 2.36 (*) 0.00 - 1.49  GLUCOSE, CAPILLARY     Status: Abnormal   Collection Time    04/14/13 12:11 PM      Result Value Ref Range   Glucose-Capillary 155 (*) 70 - 99 mg/dL  GLUCOSE, CAPILLARY     Status: Abnormal   Collection Time    04/14/13  4:46 PM      Result Value Ref Range   Glucose-Capillary 140 (*) 70 - 99 mg/dL  HEPARIN LEVEL (UNFRACTIONATED)     Status:  None   Collection Time    04/14/13  5:05 PM      Result Value Ref Range   Heparin Unfractionated 0.59  0.30 - 0.70 IU/mL   Comment:            IF HEPARIN RESULTS ARE BELOW     EXPECTED VALUES, AND PATIENT     DOSAGE HAS BEEN CONFIRMED,     SUGGEST FOLLOW UP  TESTING     OF ANTITHROMBIN III LEVELS.  PROTIME-INR     Status: Abnormal   Collection Time    04/14/13  5:05 PM      Result Value Ref Range   Prothrombin Time 26.1 (*) 11.6 - 15.2 seconds   INR 2.49 (*) 0.00 - 1.49  GLUCOSE, CAPILLARY     Status: Abnormal   Collection Time    04/14/13  8:34 PM      Result Value Ref Range   Glucose-Capillary 141 (*) 70 - 99 mg/dL  HEPARIN LEVEL (UNFRACTIONATED)     Status: None   Collection Time    04/15/13  5:25 AM      Result Value Ref Range   Heparin Unfractionated 0.69  0.30 - 0.70 IU/mL   Comment:            IF HEPARIN RESULTS ARE BELOW     EXPECTED VALUES, AND PATIENT     DOSAGE HAS BEEN CONFIRMED,     SUGGEST FOLLOW UP TESTING     OF ANTITHROMBIN III LEVELS.  CBC     Status: Abnormal   Collection Time    04/15/13  5:25 AM      Result Value Ref Range   WBC 6.9  4.0 - 10.5 K/uL   RBC 3.23 (*) 3.87 - 5.11 MIL/uL   Hemoglobin 9.9 (*) 12.0 - 15.0 g/dL   HCT 30.4 (*) 36.0 - 46.0 %   MCV 94.1  78.0 - 100.0 fL   MCH 30.7  26.0 - 34.0 pg   MCHC 32.6  30.0 - 36.0 g/dL   RDW 16.2 (*) 11.5 - 15.5 %   Platelets 170  150 - 400 K/uL  PROTIME-INR     Status: Abnormal   Collection Time    04/15/13  5:25 AM      Result Value Ref Range   Prothrombin Time 31.5 (*) 11.6 - 15.2 seconds   INR 3.19 (*) 0.00 - 1.49  GLUCOSE, CAPILLARY     Status: Abnormal   Collection Time    04/15/13 11:45 AM      Result Value Ref Range   Glucose-Capillary 139 (*) 70 - 99 mg/dL    Disposition:  Follow-up Information   Follow up with Troy Sine, MD. (office will call you)    Specialty:  Cardiology   Contact information:   [address] [place] [address] [phone]       Discharge Medications:     Duration of Discharge Encounter: Greater than 30 minutes including physician time.  Angelena Form PA-C 04/15/2013 3:53 PM

## 2013-04-15 NOTE — Progress Notes (Signed)
Subjective:  Back from dialysis; feels well  Objective:   Vital Signs in the last 24 hours: Temp:  [97.1 F (36.2 C)-98.2 F (36.8 C)] 97.8 F (36.6 C) (03/06 0947) Pulse Rate:  [52-78] 78 (03/06 0947) Resp:  [14-23] 20 (03/06 0947) BP: (98-150)/(42-68) 123/63 mmHg (03/06 0947) SpO2:  [99 %-100 %] 100 % (03/06 0947) Weight:  [154 lb 8.7 oz (70.1 kg)-164 lb 14.5 oz (74.8 kg)] 154 lb 8.7 oz (70.1 kg) (03/06 0854)  Intake/Output from previous day: 03/05 0701 - 03/06 0700 In: 1189.8 [P.O.:920; I.V.:269.8] Out: -   Medications: . aspirin EC  81 mg Oral Daily  . budesonide-formoterol  2 puff Inhalation BID  . calcium carbonate  1 tablet Oral Daily  . cinacalcet  30 mg Oral Q breakfast  . darbepoetin (ARANESP) injection - DIALYSIS  100 mcg Intravenous Q Mon-HD  . docusate sodium  100 mg Oral Daily  . doxercalciferol  4 mcg Intravenous Q M,W,F-HD  . DULoxetine  60 mg Oral QHS  . gabapentin  300 mg Oral QHS  . insulin aspart  0-9 Units Subcutaneous TID WC  . levothyroxine  150 mcg Oral QAC breakfast  . LORazepam  0.5 mg Oral QHS  . metoprolol tartrate  50 mg Oral BID  . multivitamin  1 tablet Oral QHS  . nicotine  7 mg Transdermal Daily  . pantoprazole  40 mg Oral Daily  . senna  1 tablet Oral QPM  . tacrolimus  5 mg Oral BID  . Warfarin - Pharmacist Dosing Inpatient   Does not apply q1800    . heparin 1,200 Units/hr (04/15/13 6195)    Physical Exam:   General appearance: alert, cooperative and no distress Neck: no adenopathy, no JVD, supple, symmetrical, trachea midline and thyroid not enlarged, symmetric, no tenderness/mass/nodules Lungs: clear to auscultation bilaterally Heart: regular rate and rhythm and 2/6 sem with crisp mechanical click Abdomen: soft, non-tender; bowel sounds normal; no masses,  no organomegaly and area of purelence at left previous epicardial lead site Extremities: no edema, redness or tenderness in the calves or thighs Neurologic: Grossly  normal   Rate: 74  Rhythm: normal sinus rhythm  Lab Results:   No results found for this basename: NA, K, CL, CO2, GLUCOSE, BUN, CREATININE,  in the last 72 hours No results found for this basename: TROPONINI, CK, MB,  in the last 72 hours Hepatic Function Panel No results found for this basename: PROT, ALBUMIN, AST, ALT, ALKPHOS, BILITOT, BILIDIR, IBILI,  in the last 72 hours  Recent Labs  04/15/13 0525  INR 3.19*   BNP (last 3 results)  Recent Labs  07/20/12 0154 08/30/12 0758 09/01/12 0555  PROBNP 42149.0* 41152.0* 32077.0*    Lipid Panel     Component Value Date/Time   CHOL 225* 12/22/2011 1030   TRIG 121 12/22/2011 1030   HDL 97 12/22/2011 1030   CHOLHDL 2.3 12/22/2011 1030   VLDL 24 12/22/2011 1030   LDLCALC 104* 12/22/2011 1030      Imaging:  No results found.    Assessment/Plan:   Principal Problem:   Chest pain- with tachycardia Active Problems:   DIABETES MELLITUS, TYPE II   GERD   End stage renal disease on dialysis-TTS   Palpitations- run of tachycardia after HD, lasted 15-20 minutes   S/P AVR   Chronic anticoagulation w/ coumadin, goal INR 2.5-3.0 w/ AVR  INR therapeutic. DC heparin. Back from dialysis. Will ask TTS PA to asses  area of purelence  at epicardial lead site, may need cleansing ? Cephalexin Rx. Plan DC later today    Troy Sine, MD, [facility] 04/15/2013, 11:54 AM

## 2013-04-15 NOTE — Progress Notes (Addendum)
Reviewed discharge instructions with patient and she stated her understanding.  Safety precautions and fall precautions discussed.  Left thigh dialysis graft stable without bleeding currently, after ambulation.  Discharged home with family via wheelchair. Patient given would care instructions by [facility] for left lower chest/upper abdomen surgical wound, she stated her understanding.   Sanda Linger

## 2013-04-15 NOTE — Procedures (Signed)
Patient was seen on dialysis and the procedure was supervised.  BFR 400  Via AVG BP is  102/51.   Patient appears to be tolerating treatment well- we have her off days , will be due to leave today- will cut tx short so she can go to [place] [facility] 04/15/2013

## 2013-04-15 NOTE — Progress Notes (Signed)
Called to patient's room due to her left thigh dialysis graft bleeding.  Pressure held for 25 minutes after speaking with hemodialysis nurse, then pressure dressing applied.  Dressing resaturated within 10 minutes.  Dressing removed, more pressure held and hemodialysis nurse down to apply pressure and special dressing.  Will continue to monitor.  Sanda Linger

## 2013-04-15 NOTE — Progress Notes (Signed)
ANTICOAGULATION CONSULT NOTE - Follow Up Consult  Pharmacy Consult for Heparin / Coumadin Indication: AVR  Allergies  Allergen Reactions  . Acetaminophen Other (See Comments)    Liver transplant recipient   . Codeine Itching   Patient Measurements: Height: 5\' 4"  (162.6 cm) Weight: 154 lb 8.7 oz (70.1 kg) IBW/kg (Calculated) : 54.7 Heparin Dosing Weight: 70 kg  Vital Signs: Temp: 98.2 F (36.8 C) (03/06 0854) Temp src: Oral (03/06 0854) BP: 115/57 mmHg (03/06 0854) Pulse Rate: 75 (03/06 0854)  Labs:  Recent Labs  04/13/13 0500  04/14/13 0108  04/14/13 1000 04/14/13 1705 04/15/13 0525  HGB 9.3*  --  10.0*  --   --   --  9.9*  HCT 29.1*  --  30.9*  --   --   --  30.4*  PLT 185  --  184  --   --   --  170  LABPROT 20.7*  --  21.9*  --  25.0* 26.1* 31.5*  INR 1.84*  --  1.98*  --  2.36* 2.49* 3.19*  HEPARINUNFRC 0.20*  < > 0.38  < > 0.21* 0.59 0.69  < > = values in this interval not displayed.  Estimated Creatinine Clearance: 5.9 ml/min (by C-G formula based on Cr of 10.06).  Assessment: 58 yo female admitted 04/09/2013   with AVR is currently on heparin and coumadin bridge for AVR and afib.   Coag: Heparin level and INR at goal.  No bleeding noted.  Goal of Therapy: ; Heparin level 0.3-0.7 units/ml Monitor platelets by anticoagulation protocol: Yes INR 2.5- 3.5   Plan:  1. Follow up DC of heparin with planned DC today. 2. Warfarin 4 mg po x1 tonight (Consider 4 mg daily at DC with INR follow up in the next week, anticipate some combination of 4mg  and 2 mg as home regimen) 3. Daily PT/INR, HL and CBC 4. Follow for s/s bleeding   Thank you for allowing pharmacy to be a part of this patients care team.  Rowe Robert Pharm.D., BCPS Clinical Pharmacist 04/15/2013 9:33 AM Pager: [phone] Phone: [phone]

## 2013-04-15 NOTE — Progress Notes (Signed)
Subjective:  Seen on dialysis , no complaints- INR where it needs to be- going home today- somehow we have her days off Objective Vital signs in last 24 hours: Filed Vitals:   04/15/13 0553 04/15/13 0645 04/15/13 0652 04/15/13 0730  BP: 128/43 143/59 150/64 102/51  Pulse: 52 72 72 76  Temp: 98.1 F (36.7 C) 97.1 F (36.2 C)    TempSrc: Oral Oral    Resp: 18 14    Height:      Weight: 74.8 kg (164 lb 14.5 oz) 70.1 kg (154 lb 8.7 oz)    SpO2: 100% 100%     Weight change: 0.8 kg (1 lb 12.2 oz)  Intake/Output Summary (Last 24 hours) at 04/15/13 0803 Last data filed at 04/15/13 0659  Gross per 24 hour  Intake 1189.8 ml  Output      0 ml  Net 1189.8 ml   Dialyzes at [facility] on MWF . EDW 72kg.  HD Bath 2K, 2 Ca, Dialyzer 190NR, Heparin Strd. Access L thigh AVG    Assessment/ Plan: Pt is a 58 y.o. yo female with ESRD who was admitted on 04/09/2013 with chest pain and palpitations  Assessment/Plan: 1. CP and palpitations- in setting of CABG/valve replacement-  per cards, work up seems to be negative, awaiting therapeutic INR which she has today 2. ESRD - normally [name] TTS via thigh AVG- we have her off days so will cut treatment short here so she can get HD tomorrow as OP in [place].  3. Anemia- hgb9.9 -  on aranesp 4. Secondary hyperparathyroidism- cont tums/sensipar/hectorol 5. HTN/volume- on lopressor- BP good- at EDW- actually a little under today- will need slight decrease at discharge.  6. S/p liver tx- on prograf 7. Dispo- i assume home once INR threrapeutic-decision of whether to discharge up to cards.   [facility] Pick[facility] A    Labs: Basic Metabolic Panel:  Recent Labs Lab 04/10/13 0125 04/10/13 0734 04/11/13 2008  NA 141 143 136*  K 3.4* 3.7 4.7  CL 96 98 94*  CO2 25 28 23   GLUCOSE 196* 158* 200*  BUN 34* 37* 57*  CREATININE 7.53* 8.02* 10.06*  CALCIUM 8.6 8.7 8.1*  PHOS  --   --  5.0*   Liver Function Tests:  Recent Labs Lab 04/10/13 0734  04/11/13 2008  AST 23 20  ALT 14 14  ALKPHOS 95 95  BILITOT 0.3 0.3  PROT 6.7 6.9  ALBUMIN 2.6* 2.5*   No results found for this basename: LIPASE, AMYLASE,  in the last 168 hours No results found for this basename: AMMONIA,  in the last 168 hours CBC:  Recent Labs Lab 04/10/13 0125  04/11/13 2008 04/12/13 0700 04/13/13 0500 04/14/13 0108 04/15/13 0525  WBC 6.6  < > 7.3 6.3 6.7 7.3 6.9  NEUTROABS 3.5  --   --   --   --   --   --   HGB 9.6*  < > 9.5* 9.7* 9.3* 10.0* 9.9*  HCT 29.9*  < > 28.4* 29.7* 29.1* 30.9* 30.4*  MCV 93.7  < > 92.2 92.8 93.6 93.9 94.1  PLT 159  < > 201 198 185 184 170  < > = values in this interval not displayed. Cardiac Enzymes:  Recent Labs Lab 04/10/13 0125 04/10/13 0735 04/10/13 1403 04/10/13 1958  TROPONINI <0.30 <0.30 <0.30 <0.30   CBG:  Recent Labs Lab 04/13/13 2059 04/14/13 0743 04/14/13 1211 04/14/13 1646 04/14/13 2034  GLUCAP 215* 154* 155* 140* 141*  Iron Studies: No results found for this basename: IRON, TIBC, TRANSFERRIN, FERRITIN,  in the last 72 hours Studies/Results: No results found. Medications: Infusions: . heparin 1,200 Units/hr (04/15/13 4656)    Scheduled Medications: . aspirin EC  81 mg Oral Daily  . budesonide-formoterol  2 puff Inhalation BID  . calcium carbonate  1 tablet Oral Daily  . cinacalcet  30 mg Oral Q breakfast  . darbepoetin (ARANESP) injection - DIALYSIS  100 mcg Intravenous Q Mon-HD  . docusate sodium  100 mg Oral Daily  . doxercalciferol  4 mcg Intravenous Q M,W,F-HD  . DULoxetine  60 mg Oral QHS  . gabapentin  300 mg Oral QHS  . insulin aspart  0-9 Units Subcutaneous TID WC  . levothyroxine  150 mcg Oral QAC breakfast  . LORazepam  0.5 mg Oral QHS  . metoprolol tartrate  50 mg Oral BID  . multivitamin  1 tablet Oral QHS  . nicotine  7 mg Transdermal Daily  . pantoprazole  40 mg Oral Daily  . senna  1 tablet Oral QPM  . tacrolimus  5 mg Oral BID  . Warfarin - Pharmacist Dosing  Inpatient   Does not apply q1800    have reviewed scheduled and prn medications.  Physical Exam: General: NAD Heart: RRR Lungs: moslty clear Abdomen: soft, non tender Extremities: no edema Dialysis Access: thigh AVG    04/15/2013,8:03 AM  LOS: 6 days

## 2013-04-16 DIAGNOSIS — D509 Iron deficiency anemia, unspecified: Secondary | ICD-10-CM | POA: Diagnosis not present

## 2013-04-16 DIAGNOSIS — D631 Anemia in chronic kidney disease: Secondary | ICD-10-CM | POA: Diagnosis not present

## 2013-04-16 DIAGNOSIS — N186 End stage renal disease: Secondary | ICD-10-CM | POA: Diagnosis not present

## 2013-04-16 DIAGNOSIS — N2581 Secondary hyperparathyroidism of renal origin: Secondary | ICD-10-CM | POA: Diagnosis not present

## 2013-04-19 DIAGNOSIS — N039 Chronic nephritic syndrome with unspecified morphologic changes: Secondary | ICD-10-CM | POA: Diagnosis not present

## 2013-04-19 DIAGNOSIS — D509 Iron deficiency anemia, unspecified: Secondary | ICD-10-CM | POA: Diagnosis not present

## 2013-04-19 DIAGNOSIS — D631 Anemia in chronic kidney disease: Secondary | ICD-10-CM | POA: Diagnosis not present

## 2013-04-19 DIAGNOSIS — N2581 Secondary hyperparathyroidism of renal origin: Secondary | ICD-10-CM | POA: Diagnosis not present

## 2013-04-19 DIAGNOSIS — N186 End stage renal disease: Secondary | ICD-10-CM | POA: Diagnosis not present

## 2013-04-20 ENCOUNTER — Ambulatory Visit (INDEPENDENT_AMBULATORY_CARE_PROVIDER_SITE_OTHER): Payer: Self-pay | Admitting: Cardiothoracic Surgery

## 2013-04-20 ENCOUNTER — Encounter: Payer: Self-pay | Admitting: Cardiothoracic Surgery

## 2013-04-20 ENCOUNTER — Ambulatory Visit
Admission: RE | Admit: 2013-04-20 | Discharge: 2013-04-20 | Disposition: A | Discharge status: Home / Self Care | Payer: Medicare Other | Source: Ambulatory Visit | Attending: Cardiothoracic Surgery | Admitting: Cardiothoracic Surgery

## 2013-04-20 VITALS — BP 129/68 | HR 74 | Resp 16 | Ht 64.0 in | Wt 164.0 lb

## 2013-04-20 DIAGNOSIS — Z952 Presence of prosthetic heart valve: Secondary | ICD-10-CM

## 2013-04-20 DIAGNOSIS — J90 Pleural effusion, not elsewhere classified: Secondary | ICD-10-CM | POA: Diagnosis not present

## 2013-04-20 DIAGNOSIS — Z954 Presence of other heart-valve replacement: Secondary | ICD-10-CM

## 2013-04-20 DIAGNOSIS — I359 Nonrheumatic aortic valve disorder, unspecified: Secondary | ICD-10-CM

## 2013-04-20 DIAGNOSIS — I2510 Atherosclerotic heart disease of native coronary artery without angina pectoris: Secondary | ICD-10-CM

## 2013-04-20 DIAGNOSIS — Z951 Presence of aortocoronary bypass graft: Secondary | ICD-10-CM

## 2013-04-20 DIAGNOSIS — I350 Nonrheumatic aortic (valve) stenosis: Secondary | ICD-10-CM

## 2013-04-20 NOTE — Progress Notes (Signed)
PCP is Charlynn Court, NP Referring Provider is Troy Sine, MD  Chief Complaint  Patient presents with  . Routine Post Op    3 wk f/u s/p CABG/AVR 03/15/13 with a cxr....Marland Kitchensees cardiology P.A. on 05/03/13    HPI:one month office followup after aortic valve replacement for aortic stenosis using a mechanical aortic valve with combined CABG.the patient has been on hemodialysis ever since her liver transplant 4 years ago. The patient did well after aortic valve replacement but was readmitted a few days following discharge because of rapid SVT during a dialysis session. The patient's beta blocker dose was increased and she was subtly discharged to home. She is taking Coumadin for her mechanical valve--this was placed because of her age less than 66 and her high-risk for early calcification of a tissue valve because of renal failure on dialysis. The past few weeks she is done well without recurrent symptoms of heart failure or dizziness. The surgical incisions are healed. She has remained in a sinus rhythm. She's had no bleeding complications from the Coumadin. She has been successful in smoking cessation. Past Medical History  Diagnosis Date  . 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 Chronic back pain   . CAD (coronary artery disease)   . Obesity   . Peripheral vascular disease hands and legs  . Anxiety   . Asthma   . GERD (gastroesophageal reflux disease)     takes Omeprazole daily  . Chronic constipation     takes MIralax and Colace daily  . Anemia     takes Folic Acid daily  . Hypothyroidism     takes Synthroid daily  . Depression     takes Cymbalta for "severe" depression  . Neuromuscular disorder     carpal tunnel in right hand  . Hypertension     takes Metoprolol and Lisinopril daily, sees Dr Bea Graff  . COPD (chronic obstructive pulmonary disease)   . Pneumonia     "today and several times before"  (08/30/2012)  . Chronic bronchitis     "q yr w/season changes" (08/30/2012)  . Type II diabetes mellitus     Levemir 2units daily if > 150  . History of blood transfusion     "several" (08/30/2012)  . Hepatitis C   . Migraine     "last migraine was in 2013" (08/30/2012)  . Headache     "at least monthly" (08/30/2012)  . Arthritis     "left hand, back" (08/30/2012)  . End stage renal disease on dialysis 02/27/2011    Kidneys shut down at time of liver transplant in Sept 2011 at [facility] in [place], she has been on HD ever since.  Dialyzes at [facility] HD on TTS schedule.  Had L forearm graft used 10 months then removed Dec 2012 due to suspected infection.  A right upper arm AVG was placed Dec 2012 but she developed steal symptoms acutely and it was ligated the same day.  Never had an AV fistula due to small veins.  Now has L thigh AVG put in Jan 2013, has not clotted to date.    [name] CAD (coronary artery disease) Jan. 2015    Cath: 20% LAD, 50% D1    Past Surgical History  Procedure Laterality Date  . Liver transplant  10/25/2009    sees Dr Ferol Luz 1 every 6 months, saw last in  Dec 2013. [name] Coord [phone]  . Small intestine surgery  90's  . Thrombectomy    . Arteriovenous graft placement Left 10/03/10     forearm  . Avgg removal  12/23/2010    Procedure: REMOVAL OF ARTERIOVENOUS GORETEX GRAFT ([place]);  Surgeon: Elam Dutch, MD;  Location: [place];  Service: Vascular;  Laterality: Left;  procedure started @1736 -1852  . Insertion of dialysis catheter  12/23/2010    Procedure: INSERTION OF DIALYSIS CATHETER;  Surgeon: Elam Dutch, MD;  Location: [place];  Service: Vascular;  Laterality: Right;  Right Internal Jugular 28cm dialysis catheter insertion procedure time 1701-1720   . Cholecystectomy  1993  . Cystoscopy  1990's  . Spinal growth rods  2010    "put 2 metal rods in my back; they had detetriorated" (08/30/2012)  . Av fistula placement  01/29/2011    Procedure: INSERTION  OF ARTERIOVENOUS (AV) GORE-TEX GRAFT ARM;  Surgeon: Elam Dutch, MD;  Location: [place];  Service: Vascular;  Laterality: Right;  . Av fistula placement  03/10/2011    Procedure: INSERTION OF ARTERIOVENOUS (AV) GORE-TEX GRAFT THIGH;  Surgeon: Elam Dutch, MD;  Location: [place];  Service: Vascular;  Laterality: Left;  . Tubal ligation  1990's  . Cardiac catheterization  2014  . Aortic valve replacement N/A 03/15/2013    AVR; Surgeon: Ivin Poot, MD;  Location: [facility] OR; Open Heart Surgery;  8mmCarboMedics mechanical prosthesis, top hat valve  . Coronary artery bypass graft N/A 03/15/2013    Procedure: CORONARY ARTERY BYPASS GRAFTING (CABG) times one using left internal mammary artery.;  Surgeon: Ivin Poot, MD;  Location: [place];  Service: Open Heart Surgery;  Laterality: N/A;  POSS CABG X 1  . Intraoperative transesophageal echocardiogram N/A 03/15/2013    Procedure: INTRAOPERATIVE TRANSESOPHAGEAL ECHOCARDIOGRAM;  Surgeon: Ivin Poot, MD;  Location: [place];  Service: Open Heart Surgery;  Laterality: N/A;    Family History  Problem Relation Age of Onset  . Cancer Mother   . Diabetes Mother   . Hypertension Mother   . Stroke Mother   . Cancer Father   . Anesthesia problems Neg Hx   . Hypotension Neg Hx   . Malignant hyperthermia Neg Hx   . Pseudochol deficiency Neg Hx     Social History History  Substance Use Topics  . Smoking status: Former Smoker -- 0.75 packs/day for 40 years    Types: Cigarettes    Quit date: 03/13/2013  . Smokeless tobacco: Never Used  . Alcohol Use: Yes     Comment: 08/30/2012 "last drink was at a wedding July, 2014; had a small glass of wine; never had problems w/alcohol"      Allergies  Allergen Reactions  . Acetaminophen Other (See Comments)    Liver transplant recipient   . Codeine Itching    Review of Systemsgradual steady improvement after aVR CABG Patient is not resumed smoking No complaints of edema  BP 129/68  Pulse 74  Resp  16  Ht 5\' 4"  (1.626 m)  Wt 164 lb (74.39 kg)  BMI 28.14 kg/m2  SpO2 98% Physical Exam Alert and comfortable Lungs clear Sternal incision well-healed Heart rate regular, sharp aortic valve closure sound without AI murmur Heart rate regular  Diagnostic Tests: Chest x-ray clear  Impression: Excellent early course after aVR CABG in this patient on dialysis status post liver transplant. She was told she could resume driving and light household activities  Plan:return to monitor progress  in one month

## 2013-04-21 DIAGNOSIS — N039 Chronic nephritic syndrome with unspecified morphologic changes: Secondary | ICD-10-CM | POA: Diagnosis not present

## 2013-04-21 DIAGNOSIS — D509 Iron deficiency anemia, unspecified: Secondary | ICD-10-CM | POA: Diagnosis not present

## 2013-04-21 DIAGNOSIS — N186 End stage renal disease: Secondary | ICD-10-CM | POA: Diagnosis not present

## 2013-04-21 DIAGNOSIS — N2581 Secondary hyperparathyroidism of renal origin: Secondary | ICD-10-CM | POA: Diagnosis not present

## 2013-04-21 DIAGNOSIS — D631 Anemia in chronic kidney disease: Secondary | ICD-10-CM | POA: Diagnosis not present

## 2013-04-22 ENCOUNTER — Ambulatory Visit (INDEPENDENT_AMBULATORY_CARE_PROVIDER_SITE_OTHER): Payer: Medicare Other | Admitting: Pharmacist Clinician (PhC)/ Clinical Pharmacy Specialist

## 2013-04-22 DIAGNOSIS — Z954 Presence of other heart-valve replacement: Secondary | ICD-10-CM

## 2013-04-22 DIAGNOSIS — Z7901 Long term (current) use of anticoagulants: Secondary | ICD-10-CM

## 2013-04-22 DIAGNOSIS — Z952 Presence of prosthetic heart valve: Secondary | ICD-10-CM

## 2013-04-22 LAB — POCT INR: INR: 2.2

## 2013-04-23 DIAGNOSIS — D631 Anemia in chronic kidney disease: Secondary | ICD-10-CM | POA: Diagnosis not present

## 2013-04-23 DIAGNOSIS — N2581 Secondary hyperparathyroidism of renal origin: Secondary | ICD-10-CM | POA: Diagnosis not present

## 2013-04-23 DIAGNOSIS — D509 Iron deficiency anemia, unspecified: Secondary | ICD-10-CM | POA: Diagnosis not present

## 2013-04-23 DIAGNOSIS — N186 End stage renal disease: Secondary | ICD-10-CM | POA: Diagnosis not present

## 2013-04-26 DIAGNOSIS — D631 Anemia in chronic kidney disease: Secondary | ICD-10-CM | POA: Diagnosis not present

## 2013-04-26 DIAGNOSIS — N186 End stage renal disease: Secondary | ICD-10-CM | POA: Diagnosis not present

## 2013-04-26 DIAGNOSIS — N2581 Secondary hyperparathyroidism of renal origin: Secondary | ICD-10-CM | POA: Diagnosis not present

## 2013-04-26 DIAGNOSIS — D509 Iron deficiency anemia, unspecified: Secondary | ICD-10-CM | POA: Diagnosis not present

## 2013-04-28 DIAGNOSIS — N2581 Secondary hyperparathyroidism of renal origin: Secondary | ICD-10-CM | POA: Diagnosis not present

## 2013-04-28 DIAGNOSIS — D509 Iron deficiency anemia, unspecified: Secondary | ICD-10-CM | POA: Diagnosis not present

## 2013-04-28 DIAGNOSIS — N186 End stage renal disease: Secondary | ICD-10-CM | POA: Diagnosis not present

## 2013-04-28 DIAGNOSIS — D631 Anemia in chronic kidney disease: Secondary | ICD-10-CM | POA: Diagnosis not present

## 2013-04-29 ENCOUNTER — Ambulatory Visit (INDEPENDENT_AMBULATORY_CARE_PROVIDER_SITE_OTHER): Payer: Medicare Other | Admitting: *Deleted

## 2013-04-29 DIAGNOSIS — Z7901 Long term (current) use of anticoagulants: Secondary | ICD-10-CM

## 2013-04-29 DIAGNOSIS — Z954 Presence of other heart-valve replacement: Secondary | ICD-10-CM

## 2013-04-29 DIAGNOSIS — Z952 Presence of prosthetic heart valve: Secondary | ICD-10-CM

## 2013-04-29 LAB — POCT INR: INR: 2.4

## 2013-04-30 DIAGNOSIS — D631 Anemia in chronic kidney disease: Secondary | ICD-10-CM | POA: Diagnosis not present

## 2013-04-30 DIAGNOSIS — N2581 Secondary hyperparathyroidism of renal origin: Secondary | ICD-10-CM | POA: Diagnosis not present

## 2013-04-30 DIAGNOSIS — N186 End stage renal disease: Secondary | ICD-10-CM | POA: Diagnosis not present

## 2013-04-30 DIAGNOSIS — D509 Iron deficiency anemia, unspecified: Secondary | ICD-10-CM | POA: Diagnosis not present

## 2013-05-03 ENCOUNTER — Ambulatory Visit: Payer: Medicare Other | Admitting: Cardiology

## 2013-05-03 DIAGNOSIS — D509 Iron deficiency anemia, unspecified: Secondary | ICD-10-CM | POA: Diagnosis not present

## 2013-05-03 DIAGNOSIS — N039 Chronic nephritic syndrome with unspecified morphologic changes: Secondary | ICD-10-CM | POA: Diagnosis not present

## 2013-05-03 DIAGNOSIS — N2581 Secondary hyperparathyroidism of renal origin: Secondary | ICD-10-CM | POA: Diagnosis not present

## 2013-05-03 DIAGNOSIS — D631 Anemia in chronic kidney disease: Secondary | ICD-10-CM | POA: Diagnosis not present

## 2013-05-03 DIAGNOSIS — N186 End stage renal disease: Secondary | ICD-10-CM | POA: Diagnosis not present

## 2013-05-05 DIAGNOSIS — N2581 Secondary hyperparathyroidism of renal origin: Secondary | ICD-10-CM | POA: Diagnosis not present

## 2013-05-05 DIAGNOSIS — D509 Iron deficiency anemia, unspecified: Secondary | ICD-10-CM | POA: Diagnosis not present

## 2013-05-05 DIAGNOSIS — D631 Anemia in chronic kidney disease: Secondary | ICD-10-CM | POA: Diagnosis not present

## 2013-05-05 DIAGNOSIS — N186 End stage renal disease: Secondary | ICD-10-CM | POA: Diagnosis not present

## 2013-05-07 DIAGNOSIS — D509 Iron deficiency anemia, unspecified: Secondary | ICD-10-CM | POA: Diagnosis not present

## 2013-05-07 DIAGNOSIS — N039 Chronic nephritic syndrome with unspecified morphologic changes: Secondary | ICD-10-CM | POA: Diagnosis not present

## 2013-05-07 DIAGNOSIS — D631 Anemia in chronic kidney disease: Secondary | ICD-10-CM | POA: Diagnosis not present

## 2013-05-07 DIAGNOSIS — N186 End stage renal disease: Secondary | ICD-10-CM | POA: Diagnosis not present

## 2013-05-07 DIAGNOSIS — N2581 Secondary hyperparathyroidism of renal origin: Secondary | ICD-10-CM | POA: Diagnosis not present

## 2013-05-09 ENCOUNTER — Ambulatory Visit (INDEPENDENT_AMBULATORY_CARE_PROVIDER_SITE_OTHER): Payer: Medicare Other | Admitting: Pharmacist Clinician (PhC)/ Clinical Pharmacy Specialist

## 2013-05-09 DIAGNOSIS — Z954 Presence of other heart-valve replacement: Secondary | ICD-10-CM | POA: Diagnosis not present

## 2013-05-09 DIAGNOSIS — Z7901 Long term (current) use of anticoagulants: Secondary | ICD-10-CM

## 2013-05-09 DIAGNOSIS — Z952 Presence of prosthetic heart valve: Secondary | ICD-10-CM

## 2013-05-09 LAB — POCT INR: INR: 3.3

## 2013-05-10 DIAGNOSIS — N186 End stage renal disease: Secondary | ICD-10-CM | POA: Diagnosis not present

## 2013-05-10 DIAGNOSIS — N2581 Secondary hyperparathyroidism of renal origin: Secondary | ICD-10-CM | POA: Diagnosis not present

## 2013-05-10 DIAGNOSIS — D631 Anemia in chronic kidney disease: Secondary | ICD-10-CM | POA: Diagnosis not present

## 2013-05-10 DIAGNOSIS — D509 Iron deficiency anemia, unspecified: Secondary | ICD-10-CM | POA: Diagnosis not present

## 2013-05-12 DIAGNOSIS — N186 End stage renal disease: Secondary | ICD-10-CM | POA: Diagnosis not present

## 2013-05-12 DIAGNOSIS — D509 Iron deficiency anemia, unspecified: Secondary | ICD-10-CM | POA: Diagnosis not present

## 2013-05-12 DIAGNOSIS — D631 Anemia in chronic kidney disease: Secondary | ICD-10-CM | POA: Diagnosis not present

## 2013-05-12 DIAGNOSIS — IMO0002 Reserved for concepts with insufficient information to code with codable children: Secondary | ICD-10-CM | POA: Diagnosis not present

## 2013-05-12 DIAGNOSIS — N2581 Secondary hyperparathyroidism of renal origin: Secondary | ICD-10-CM | POA: Diagnosis not present

## 2013-05-13 ENCOUNTER — Encounter: Payer: Self-pay | Admitting: Cardiology

## 2013-05-13 ENCOUNTER — Ambulatory Visit (INDEPENDENT_AMBULATORY_CARE_PROVIDER_SITE_OTHER): Payer: Medicare Other | Admitting: Cardiology

## 2013-05-13 VITALS — BP 120/60 | HR 72 | Ht 64.0 in | Wt 160.0 lb

## 2013-05-13 DIAGNOSIS — I359 Nonrheumatic aortic valve disorder, unspecified: Secondary | ICD-10-CM

## 2013-05-13 DIAGNOSIS — N186 End stage renal disease: Secondary | ICD-10-CM

## 2013-05-13 DIAGNOSIS — I2510 Atherosclerotic heart disease of native coronary artery without angina pectoris: Secondary | ICD-10-CM

## 2013-05-13 DIAGNOSIS — F17200 Nicotine dependence, unspecified, uncomplicated: Secondary | ICD-10-CM

## 2013-05-13 DIAGNOSIS — I471 Supraventricular tachycardia, unspecified: Secondary | ICD-10-CM

## 2013-05-13 DIAGNOSIS — Z992 Dependence on renal dialysis: Secondary | ICD-10-CM

## 2013-05-13 DIAGNOSIS — E119 Type 2 diabetes mellitus without complications: Secondary | ICD-10-CM

## 2013-05-13 DIAGNOSIS — Z951 Presence of aortocoronary bypass graft: Secondary | ICD-10-CM | POA: Diagnosis not present

## 2013-05-13 DIAGNOSIS — Z7901 Long term (current) use of anticoagulants: Secondary | ICD-10-CM | POA: Diagnosis not present

## 2013-05-13 DIAGNOSIS — I350 Nonrheumatic aortic (valve) stenosis: Secondary | ICD-10-CM

## 2013-05-13 DIAGNOSIS — Z720 Tobacco use: Secondary | ICD-10-CM

## 2013-05-13 NOTE — Assessment & Plan Note (Signed)
No SOB, no Complaints doing well

## 2013-05-13 NOTE — Patient Instructions (Signed)
Continue what you are doing.  Call if any further rapid HR or problems.  See Dr. [name] back in 5 weeks.

## 2013-05-13 NOTE — Assessment & Plan Note (Signed)
Stable INR followed by our office next appt in 3 weeks.

## 2013-05-13 NOTE — Assessment & Plan Note (Signed)
Does smoke 2-3 cigarettes since discharge, mostly when people smoke in her house.  I have asked to put up a sign that states no smoking in house per doctor's orders.  She is agreeable.  She was also instructed to remove patch if she has to smoke.

## 2013-05-13 NOTE — Assessment & Plan Note (Signed)
Hospitalized 04/09/13-04/15/13  For chest pain with tachycardia resolved on admit.  No further episodes, BB increased in the hospital.

## 2013-05-13 NOTE — Assessment & Plan Note (Signed)
No SOB, no edema.  She stated she just feels so tired after dialysis  .Kidneys shut down at time of liver transplant in Sept 2011 at [facility] in [place], she has been on HD ever since.  Dialyzes at [facility] HD on TTS schedule.  Had L forearm graft used 10 months then removed Dec 2012 due to suspected infection.  A right upper arm AVG was placed Dec 2012 but she developed steal symptoms acutely and it was ligated the same day.  Never had an AV fistula due to small veins.  Now has L thigh AVG put in Jan 2013, has not clotted to date.

## 2013-05-13 NOTE — Assessment & Plan Note (Signed)
Up and down depending on diet.

## 2013-05-13 NOTE — Progress Notes (Signed)
05/13/2013   PCP: Charlynn Court, NP   Chief Complaint  Patient presents with  . Follow-up    S/P Hospital visit    Primary Cardiologist: Dr. [name]  HPI: 58 y/o woman with CAD s/p recent single-vessel CABG (LIMA -> LAD) and mechanical AVR (03/15/13) by Dr. Prescott Gum. She also has ESRD with HD TTS, ESLD s/p liver transplant (2011), DM, HTN, HLD, anxiety, and tobacco use,. She presented 04/09/13 for further evaluation of chest pain and palpitations. She has had mild chest wall soreness since being d/c'ed on 03/28/13 to SNF for rehab. However, on 2/27 at ~1 PM, she noticed progressive worsening of the chest pain. She describes it as a non-radiating dull ache in the center of her chest.  She also developed palpitations at that time. She endorses shortness of breath as well. She mentioned her discomfort to a nurse later in the afternoon; the nurse noted the patient to be quite tachycardic (HR ~150 bpm) and contact the patient's cardiac surgeon. They were instructed to attempt vagal maneuvers, which did not alleviate the patient's symptoms. EMS was subsequently summoned and found the patient to be tachycardic with a heart rate of 165 bpm. Upon standing to get to the stretcher, the patient's palpitations resolved and she was found to have no normal heart rate in the 70's. The patient reports similar episodes prior to her AVR and CABG, with them typically occuring at or immediately after hemodialysis.   [name] INR was low so pt placed on heparin until INR therapeutic. Her BB was increased.    Today she has no complaints, no SOB, no chest pain, no fast heart rates. Her glucose is stable.     Allergies  Allergen Reactions  . Acetaminophen Other (See Comments)    Liver transplant recipient   . Codeine Itching      Past Medical History  Diagnosis Date  . 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 Chronic back  pain   . CAD (coronary artery disease)   . Obesity   . Peripheral vascular disease hands and legs  . Anxiety   . Asthma   . GERD (gastroesophageal reflux disease)     takes Omeprazole daily  . Chronic constipation     takes MIralax and Colace daily  . Anemia     takes Folic Acid daily  . Hypothyroidism     takes Synthroid daily  . Depression     takes Cymbalta for "severe" depression  . Neuromuscular disorder     carpal tunnel in right hand  . Hypertension     takes Metoprolol and Lisinopril daily, sees Dr Bea Graff  . COPD (chronic obstructive pulmonary disease)   . Pneumonia     "today and several times before" (08/30/2012)  . Chronic bronchitis     "q yr w/season changes" (08/30/2012)  . Type II diabetes mellitus     Levemir 2units daily if > 150  . History of blood transfusion     "several" (08/30/2012)  . Hepatitis C   . Migraine     "last migraine was in 2013" (08/30/2012)  . Headache     "at least monthly" (08/30/2012)  . Arthritis     "left hand, back" (08/30/2012)  . End stage renal disease on dialysis 02/27/2011    Kidneys shut down at time of liver  transplant in Sept 2011 at [facility] in [place], she has been on HD ever since.  Dialyzes at [facility] HD on TTS schedule.  Had L forearm graft used 10 months then removed Dec 2012 due to suspected infection.  A right upper arm AVG was placed Dec 2012 but she developed steal symptoms acutely and it was ligated the same day.  Never had an AV fistula due to small veins.  Now has L thigh AVG put in Jan 2013, has not clotted to date.    [name] CAD (coronary artery disease) Jan. 2015    Cath: 20% LAD, 50% D1    Past Surgical History  Procedure Laterality Date  . Liver transplant  10/25/2009    sees Dr Ferol Luz 1 every 6 months, saw last in Dec 2013. [name] Coord [phone]  . Small intestine surgery  90's  . Thrombectomy    . Arteriovenous graft placement Left 10/03/10     forearm  . Avgg removal  12/23/2010    Procedure:  REMOVAL OF ARTERIOVENOUS GORETEX GRAFT ([place]);  Surgeon: Elam Dutch, MD;  Location: [place];  Service: Vascular;  Laterality: Left;  procedure started @1736 -3151  . Insertion of dialysis catheter  12/23/2010    Procedure: INSERTION OF DIALYSIS CATHETER;  Surgeon: Elam Dutch, MD;  Location: [place];  Service: Vascular;  Laterality: Right;  Right Internal Jugular 28cm dialysis catheter insertion procedure time 1701-1720   . Cholecystectomy  1993  . Cystoscopy  1990's  . Spinal growth rods  2010    "put 2 metal rods in my back; they had detetriorated" (08/30/2012)  . Av fistula placement  01/29/2011    Procedure: INSERTION OF ARTERIOVENOUS (AV) GORE-TEX GRAFT ARM;  Surgeon: Elam Dutch, MD;  Location: [place];  Service: Vascular;  Laterality: Right;  . Av fistula placement  03/10/2011    Procedure: INSERTION OF ARTERIOVENOUS (AV) GORE-TEX GRAFT THIGH;  Surgeon: Elam Dutch, MD;  Location: [place];  Service: Vascular;  Laterality: Left;  . Tubal ligation  1990's  . Cardiac catheterization  2014  . Aortic valve replacement N/A 03/15/2013    AVR; Surgeon: Ivin Poot, MD;  Location: [facility] OR; Open Heart Surgery;  13mmCarboMedics mechanical prosthesis, top hat valve  . Coronary artery bypass graft N/A 03/15/2013    Procedure: CORONARY ARTERY BYPASS GRAFTING (CABG) times one using left internal mammary artery.;  Surgeon: Ivin Poot, MD;  Location: [place];  Service: Open Heart Surgery;  Laterality: N/A;  POSS CABG X 1  . Intraoperative transesophageal echocardiogram N/A 03/15/2013    Procedure: INTRAOPERATIVE TRANSESOPHAGEAL ECHOCARDIOGRAM;  Surgeon: Ivin Poot, MD;  Location: [place];  Service: Open Heart Surgery;  Laterality: N/A;    VOH:YWVPXTG:GY colds or fevers, no weight changes Skin:no rashes or ulcers HEENT:no blurred vision, no congestion CV:see HPI PUL:see HPI GI:no diarrhea constipation or melena, no indigestion GU:no hematuria, no dysuria//dialysis T,Th, S MS:no joint  pain, no claudication Neuro:no syncope, no lightheadedness Endo:up and down but stable diabetes, no thyroid disease  PHYSICAL EXAM BP 120/60  Pulse 72  Ht 5\' 4"  (1.626 m)  Wt 160 lb (72.576 kg)  BMI 27.45 kg/m2 General:Pleasant affect, NAD Skin:Warm and dry, brisk capillary refill HEENT:normocephalic, sclera clear, mucus membranes moist Neck:supple, no JVD, no bruits  Heart:S1S2 RRR with soft 1/6 systolic murmur and crisp closure of valve, no gallup, rub or click  Chest wall incision healing Lungs:clear without rales, rhonchi, or wheezes IRS:WNIO, non tender, + BS, do  not palpate liver spleen or masses Ext:no lower ext edema, 2+ pedal pulses, 2+ radial pulses Neuro:alert and oriented, MAE, follows commands, + facial symmetry  EKG:SR LVH with repol. QtC shorter than in hospital  ASSESSMENT AND PLAN Aortic stenosis, moderate-severe at cath Jan 2015. s/p Mechanical AVR 03/2013 No SOB, no Complaints doing well  Long term (current) use of anticoagulants Stable INR followed by our office next appt in 3 weeks.  Paroxysmal supraventricular tachycardia, s/p valve replacement, with chest pain. resolved as outpatient Hospitalized 04/09/13-04/15/13  For chest pain with tachycardia resolved on admit.  No further episodes, BB increased in the hospital.  Tobacco abuse Does smoke 2-3 cigarettes since discharge, mostly when people smoke in her house.  I have asked to put up a sign that states no smoking in house per doctor's orders.  She is agreeable.  She was also instructed to remove patch if she has to smoke.  DIABETES MELLITUS, TYPE II Up and down depending on diet.  End stage renal disease on dialysis-TTS No SOB, no edema.  She stated she just feels so tired after dialysis  Kidneys shut down at time of liver transplant in Sept 2011 at [facility] in [place], she has been on HD ever since.  Dialyzes at [facility] HD on TTS schedule.  Had L forearm graft used 10 months then removed Dec 2012 due to  suspected infection.  A right upper arm AVG was placed Dec 2012 but she developed steal symptoms acutely and it was ligated the same day.  Never had an AV fistula due to small veins.  Now has L thigh AVG put in Jan 2013, has not clotted to date.      Follow up with Dr. [name] in 5 weeks.

## 2013-05-26 ENCOUNTER — Telehealth: Payer: Self-pay | Admitting: Pharmacist Clinician (PhC)/ Clinical Pharmacy Specialist

## 2013-05-26 DIAGNOSIS — I10 Essential (primary) hypertension: Secondary | ICD-10-CM | POA: Diagnosis not present

## 2013-05-26 DIAGNOSIS — R58 Hemorrhage, not elsewhere classified: Secondary | ICD-10-CM | POA: Diagnosis not present

## 2013-05-26 DIAGNOSIS — T82598A Other mechanical complication of other cardiac and vascular devices and implants, initial encounter: Secondary | ICD-10-CM | POA: Diagnosis not present

## 2013-05-26 DIAGNOSIS — E7800 Pure hypercholesterolemia, unspecified: Secondary | ICD-10-CM | POA: Diagnosis not present

## 2013-05-26 DIAGNOSIS — Z79899 Other long term (current) drug therapy: Secondary | ICD-10-CM | POA: Diagnosis not present

## 2013-05-26 DIAGNOSIS — J449 Chronic obstructive pulmonary disease, unspecified: Secondary | ICD-10-CM | POA: Diagnosis not present

## 2013-05-26 DIAGNOSIS — Y838 Other surgical procedures as the cause of abnormal reaction of the patient, or of later complication, without mention of misadventure at the time of the procedure: Secondary | ICD-10-CM | POA: Diagnosis not present

## 2013-05-26 DIAGNOSIS — K219 Gastro-esophageal reflux disease without esophagitis: Secondary | ICD-10-CM | POA: Diagnosis not present

## 2013-05-26 DIAGNOSIS — E119 Type 2 diabetes mellitus without complications: Secondary | ICD-10-CM | POA: Diagnosis not present

## 2013-05-26 DIAGNOSIS — R6889 Other general symptoms and signs: Secondary | ICD-10-CM | POA: Diagnosis not present

## 2013-05-26 DIAGNOSIS — Z794 Long term (current) use of insulin: Secondary | ICD-10-CM | POA: Diagnosis not present

## 2013-05-26 DIAGNOSIS — T82898A Other specified complication of vascular prosthetic devices, implants and grafts, initial encounter: Secondary | ICD-10-CM | POA: Diagnosis not present

## 2013-05-26 NOTE — Telephone Encounter (Signed)
Dtr states [name] had to go to ER today after dialysis, due to bleeding at site.  Apparently happened week or two ago as well, didn't go to ER then, but took 90 minutes to stop.  Asked Dtr to ask ER what INR is today, assuming they drew level.  Dtr to call back with that information.  (Pt is s/p mechanical AVR 03/2013)

## 2013-05-27 ENCOUNTER — Other Ambulatory Visit: Payer: Self-pay | Admitting: Cardiothoracic Surgery

## 2013-05-27 DIAGNOSIS — I359 Nonrheumatic aortic valve disorder, unspecified: Secondary | ICD-10-CM

## 2013-05-27 NOTE — Telephone Encounter (Signed)
Dtr called back, states INR at [facility] 2.6 today.  Was advised by ER to hold dose x 2 then resume previous dose.  Will arrange for next INR in 1 week

## 2013-06-01 ENCOUNTER — Encounter: Payer: Self-pay | Admitting: Cardiothoracic Surgery

## 2013-06-01 ENCOUNTER — Ambulatory Visit
Admission: RE | Admit: 2013-06-01 | Discharge: 2013-06-01 | Disposition: A | Discharge status: Home / Self Care | Payer: Medicare Other | Source: Ambulatory Visit | Attending: Cardiothoracic Surgery | Admitting: Cardiothoracic Surgery

## 2013-06-01 ENCOUNTER — Ambulatory Visit: Payer: Medicare Other | Admitting: Pharmacist Clinician (PhC)/ Clinical Pharmacy Specialist

## 2013-06-01 ENCOUNTER — Ambulatory Visit (INDEPENDENT_AMBULATORY_CARE_PROVIDER_SITE_OTHER): Payer: Medicare Other | Admitting: Pharmacist Clinician (PhC)/ Clinical Pharmacy Specialist

## 2013-06-01 ENCOUNTER — Ambulatory Visit (INDEPENDENT_AMBULATORY_CARE_PROVIDER_SITE_OTHER): Payer: Self-pay | Admitting: Cardiothoracic Surgery

## 2013-06-01 VITALS — BP 138/66 | HR 70 | Resp 20 | Ht 64.0 in | Wt 160.0 lb

## 2013-06-01 DIAGNOSIS — I359 Nonrheumatic aortic valve disorder, unspecified: Secondary | ICD-10-CM

## 2013-06-01 DIAGNOSIS — Z7901 Long term (current) use of anticoagulants: Secondary | ICD-10-CM | POA: Diagnosis not present

## 2013-06-01 DIAGNOSIS — Z954 Presence of other heart-valve replacement: Secondary | ICD-10-CM

## 2013-06-01 DIAGNOSIS — Z952 Presence of prosthetic heart valve: Secondary | ICD-10-CM

## 2013-06-01 DIAGNOSIS — R0989 Other specified symptoms and signs involving the circulatory and respiratory systems: Secondary | ICD-10-CM | POA: Diagnosis not present

## 2013-06-01 DIAGNOSIS — Z951 Presence of aortocoronary bypass graft: Secondary | ICD-10-CM

## 2013-06-01 DIAGNOSIS — I2510 Atherosclerotic heart disease of native coronary artery without angina pectoris: Secondary | ICD-10-CM

## 2013-06-01 DIAGNOSIS — I350 Nonrheumatic aortic (valve) stenosis: Secondary | ICD-10-CM

## 2013-06-01 LAB — POCT INR: INR: 1.8

## 2013-06-01 NOTE — Progress Notes (Signed)
PCP is [name] Court, NP Referring Provider is [name] Court, NP  Chief Complaint  Patient presents with  . Routine Post Op    1 month f/u with CXR    HPI: Patient is doing well 2 months after aortic valve replacement for aortic stenosis with a mechanical valve. The patient is on chronic hemodialysis following liver transplantion. She denies any symptoms of CHF or angina. Incisions are well-healed. The patient has noted a significant improvement in her exercise tolerance since aVR.   Past Medical History  Diagnosis Date  . S/P liver transplant     2011 at 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 Chronic back pain   . CAD (coronary artery disease)   . Obesity   . Peripheral vascular disease hands and legs  . Anxiety   . Asthma   . GERD (gastroesophageal reflux disease)     takes Omeprazole daily  . Chronic constipation     takes MIralax and Colace daily  . Anemia     takes Folic Acid daily  . Hypothyroidism     takes Synthroid daily  . Depression     takes Cymbalta for "severe" depression  . Neuromuscular disorder     carpal tunnel in right hand  . Hypertension     takes Metoprolol and Lisinopril daily, sees Dr Bea Graff  . COPD (chronic obstructive pulmonary disease)   . Pneumonia     "today and several times before" (08/30/2012)  . Chronic bronchitis     "q yr w/season changes" (08/30/2012)  . Type II diabetes mellitus     Levemir 2units daily if > 150  . History of blood transfusion     "several" (08/30/2012)  . Hepatitis C   . Migraine     "last migraine was in 2013" (08/30/2012)  . Headache     "at least monthly" (08/30/2012)  . Arthritis     "left hand, back" (08/30/2012)  . End stage renal disease on dialysis 02/27/2011    Kidneys shut down at time of liver transplant in Sept 2011 at [facility] in [place], she has been on HD ever since.  Dialyzes at [facility] HD on TTS schedule.  Had L forearm graft used 10 months then removed  Dec 2012 due to suspected infection.  A right upper arm AVG was placed Dec 2012 but she developed steal symptoms acutely and it was ligated the same day.  Never had an AV fistula due to small veins.  Now has L thigh AVG put in Jan 2013, has not clotted to date.    [name] CAD (coronary artery disease) Jan. 2015    Cath: 20% LAD, 50% D1    Past Surgical History  Procedure Laterality Date  . Liver transplant  10/25/2009    sees Dr Ferol Luz 1 every 6 months, saw last in Dec 2013. [name] Coord [phone]  . Small intestine surgery  90's  . Thrombectomy    . Arteriovenous graft placement Left 10/03/10     forearm  . Avgg removal  12/23/2010    Procedure: REMOVAL OF ARTERIOVENOUS GORETEX GRAFT ([place]);  Surgeon: Elam Dutch, MD;  Location: [place];  Service: Vascular;  Laterality: Left;  procedure started @1736 -1852  . Insertion of dialysis catheter  12/23/2010    Procedure: INSERTION OF DIALYSIS CATHETER;  Surgeon: Elam Dutch, MD;  Location: [place];  Service: Vascular;  Laterality: Right;  Right Internal  Jugular 28cm dialysis catheter insertion procedure time 1701-1720   . Cholecystectomy  1993  . Cystoscopy  1990's  . Spinal growth rods  2010    "put 2 metal rods in my back; they had detetriorated" (08/30/2012)  . Av fistula placement  01/29/2011    Procedure: INSERTION OF ARTERIOVENOUS (AV) GORE-TEX GRAFT ARM;  Surgeon: Elam Dutch, MD;  Location: [place];  Service: Vascular;  Laterality: Right;  . Av fistula placement  03/10/2011    Procedure: INSERTION OF ARTERIOVENOUS (AV) GORE-TEX GRAFT THIGH;  Surgeon: Elam Dutch, MD;  Location: [place];  Service: Vascular;  Laterality: Left;  . Tubal ligation  1990's  . Cardiac catheterization  2014  . Aortic valve replacement N/A 03/15/2013    AVR; Surgeon: Ivin Poot, MD;  Location: [facility] OR; Open Heart Surgery;  2mmCarboMedics mechanical prosthesis, top hat valve  . Coronary artery bypass graft N/A 03/15/2013    Procedure: CORONARY  ARTERY BYPASS GRAFTING (CABG) times one using left internal mammary artery.;  Surgeon: Ivin Poot, MD;  Location: [place];  Service: Open Heart Surgery;  Laterality: N/A;  POSS CABG X 1  . Intraoperative transesophageal echocardiogram N/A 03/15/2013    Procedure: INTRAOPERATIVE TRANSESOPHAGEAL ECHOCARDIOGRAM;  Surgeon: Ivin Poot, MD;  Location: [place];  Service: Open Heart Surgery;  Laterality: N/A;    Family History  Problem Relation Age of Onset  . Cancer Mother   . Diabetes Mother   . Hypertension Mother   . Stroke Mother   . Cancer Father   . Anesthesia problems Neg Hx   . Hypotension Neg Hx   . Malignant hyperthermia Neg Hx   . Pseudochol deficiency Neg Hx     Social History History  Substance Use Topics  . Smoking status: Former Smoker -- 0.75 packs/day for 40 years    Types: Cigarettes    Quit date: 03/13/2013  . Smokeless tobacco: Never Used     Comment: still smokng 1 or 2 a day  . Alcohol Use: Yes     Comment: 08/30/2012 "last drink was at a wedding July, 2014; had a small glass of wine; never had problems w/alcohol"      Allergies  Allergen Reactions  . Acetaminophen Other (See Comments)    Liver transplant recipient   . Codeine Itching    Review of Systems no complaints today  BP 138/66  Pulse 70  Resp 20  Ht 5\' 4"  (1.626 m)  Wt 160 lb (72.576 kg)  BMI 27.45 kg/m2  SpO2 96% Physical Exam Alert and comfortable Lungs clear Heart rhythm regular Aortic mechanical valve sharp closure signed, no AI murmur No peripheral edema  Diagnostic Tests: Chest x-ray clear status post aVR-sternotomy  Impression: Doing well 2 months postop.-she can resume normal activities. The patient understands the importance of dental hygiene and specifically antibiotic prophylaxis prior to significant dental work was discussed with patient.  Plan: Return as necessary. She will be followed by her cardiologist.

## 2013-06-02 DIAGNOSIS — E1165 Type 2 diabetes mellitus with hyperglycemia: Secondary | ICD-10-CM | POA: Diagnosis not present

## 2013-06-02 DIAGNOSIS — IMO0002 Reserved for concepts with insufficient information to code with codable children: Secondary | ICD-10-CM | POA: Diagnosis not present

## 2013-06-07 DIAGNOSIS — Z954 Presence of other heart-valve replacement: Secondary | ICD-10-CM | POA: Diagnosis not present

## 2013-06-07 LAB — PROTIME-INR

## 2013-06-08 LAB — PROTIME-INR: INR: 2.9 — AB (ref <=1.1)

## 2013-06-09 DIAGNOSIS — N186 End stage renal disease: Secondary | ICD-10-CM | POA: Diagnosis not present

## 2013-06-10 ENCOUNTER — Ambulatory Visit (INDEPENDENT_AMBULATORY_CARE_PROVIDER_SITE_OTHER): Payer: Medicare Other | Admitting: Pharmacist Clinician (PhC)/ Clinical Pharmacy Specialist

## 2013-06-10 DIAGNOSIS — Z7901 Long term (current) use of anticoagulants: Secondary | ICD-10-CM

## 2013-06-10 DIAGNOSIS — Z954 Presence of other heart-valve replacement: Secondary | ICD-10-CM

## 2013-06-10 DIAGNOSIS — Z952 Presence of prosthetic heart valve: Secondary | ICD-10-CM

## 2013-06-11 DIAGNOSIS — IMO0002 Reserved for concepts with insufficient information to code with codable children: Secondary | ICD-10-CM | POA: Diagnosis not present

## 2013-06-11 DIAGNOSIS — N039 Chronic nephritic syndrome with unspecified morphologic changes: Secondary | ICD-10-CM | POA: Diagnosis not present

## 2013-06-11 DIAGNOSIS — N186 End stage renal disease: Secondary | ICD-10-CM | POA: Diagnosis not present

## 2013-06-11 DIAGNOSIS — D631 Anemia in chronic kidney disease: Secondary | ICD-10-CM | POA: Diagnosis not present

## 2013-06-11 DIAGNOSIS — N2581 Secondary hyperparathyroidism of renal origin: Secondary | ICD-10-CM | POA: Diagnosis not present

## 2013-06-11 DIAGNOSIS — D509 Iron deficiency anemia, unspecified: Secondary | ICD-10-CM | POA: Diagnosis not present

## 2013-06-21 DIAGNOSIS — E11329 Type 2 diabetes mellitus with mild nonproliferative diabetic retinopathy without macular edema: Secondary | ICD-10-CM | POA: Diagnosis not present

## 2013-06-21 DIAGNOSIS — E11319 Type 2 diabetes mellitus with unspecified diabetic retinopathy without macular edema: Secondary | ICD-10-CM | POA: Diagnosis not present

## 2013-06-21 DIAGNOSIS — I359 Nonrheumatic aortic valve disorder, unspecified: Secondary | ICD-10-CM | POA: Diagnosis not present

## 2013-06-21 DIAGNOSIS — E119 Type 2 diabetes mellitus without complications: Secondary | ICD-10-CM | POA: Diagnosis not present

## 2013-06-21 DIAGNOSIS — I10 Essential (primary) hypertension: Secondary | ICD-10-CM | POA: Diagnosis not present

## 2013-06-21 LAB — PROTIME-INR: INR: 3.5 — AB (ref 0.9–1.1)

## 2013-06-22 ENCOUNTER — Ambulatory Visit (INDEPENDENT_AMBULATORY_CARE_PROVIDER_SITE_OTHER): Payer: Medicare Other | Admitting: Pharmacist Clinician (PhC)/ Clinical Pharmacy Specialist

## 2013-06-22 DIAGNOSIS — Z954 Presence of other heart-valve replacement: Secondary | ICD-10-CM

## 2013-06-22 DIAGNOSIS — Z7901 Long term (current) use of anticoagulants: Secondary | ICD-10-CM

## 2013-06-22 DIAGNOSIS — Z952 Presence of prosthetic heart valve: Secondary | ICD-10-CM

## 2013-06-27 ENCOUNTER — Ambulatory Visit (INDEPENDENT_AMBULATORY_CARE_PROVIDER_SITE_OTHER): Payer: Medicare Other | Admitting: Cardiovascular Disease

## 2013-06-27 VITALS — BP 170/82 | HR 72 | Ht 64.5 in | Wt 165.4 lb

## 2013-06-27 DIAGNOSIS — Z952 Presence of prosthetic heart valve: Secondary | ICD-10-CM

## 2013-06-27 DIAGNOSIS — I2510 Atherosclerotic heart disease of native coronary artery without angina pectoris: Secondary | ICD-10-CM | POA: Diagnosis not present

## 2013-06-27 DIAGNOSIS — N186 End stage renal disease: Secondary | ICD-10-CM

## 2013-06-27 DIAGNOSIS — Z954 Presence of other heart-valve replacement: Secondary | ICD-10-CM | POA: Diagnosis not present

## 2013-06-27 DIAGNOSIS — E039 Hypothyroidism, unspecified: Secondary | ICD-10-CM

## 2013-06-27 DIAGNOSIS — Z951 Presence of aortocoronary bypass graft: Secondary | ICD-10-CM | POA: Diagnosis not present

## 2013-06-27 DIAGNOSIS — I471 Supraventricular tachycardia: Secondary | ICD-10-CM | POA: Diagnosis not present

## 2013-06-27 DIAGNOSIS — I10 Essential (primary) hypertension: Secondary | ICD-10-CM | POA: Diagnosis not present

## 2013-06-27 DIAGNOSIS — Z992 Dependence on renal dialysis: Secondary | ICD-10-CM

## 2013-06-27 DIAGNOSIS — Z7901 Long term (current) use of anticoagulants: Secondary | ICD-10-CM

## 2013-06-27 MED ORDER — AMLODIPINE BESYLATE 5 MG PO TABS: ORAL_TABLET | ORAL | Status: DC

## 2013-06-27 NOTE — Patient Instructions (Signed)
Your physician recommends that you schedule a follow-up appointment in: 4 months.  Your physician has recommended you make the following change in your medication: start new prescription for amlodipine as directed on the bottle. This has already been sent to the pharmacy.

## 2013-06-28 ENCOUNTER — Encounter: Payer: Self-pay | Admitting: Cardiovascular Disease

## 2013-06-28 NOTE — Progress Notes (Signed)
Patient ID: [name], female   DOB: 30-May-1955, 58 y.o.   MRN: [id number]     HPI: [name] is a 58 y.o. female who presents to the office today for a follow up cardiology evaluation following her recent hospitalizations.  Ms. [name] is a 58 year old, African American female with end-stage renal disease, who has been on chronic hemodialysis.  After undergoing liver transplantation at [facility] for hepatitis C.  Is admitted to: Hospital in January with increasing chest pain.  A dobutamine echo study suggested severe AS with an ejection fraction of 45%.  She ultimately underwent cardiac catheterization by me which revealed mild CAD with 50% narrowing in moderately severe aortic stenosis with aortic valve area in the range of 0.8-1.2.  She ultimately underwent aortic valve replacement with mechanical aortic valve and single vessel LIMA to LAD CABG revascularization surgery on 03/15/2013 by Dr. [name].  Following discharge, she subsequently re presented to the hospital for further evaluation of chest pain and palpitations.  Troponins were negative.  Beta blocker therapy was increased to following discharge.  She saw [name] in her office and presents now for followup evaluation with me.  [name] Paganini, [name], denies any episodes of further palpitations or tachycardia.  Her shortness of breath has improved.  She denies recent chest pressure.  However, she states her blood pressure has been consistently elevated.  She undergoes dialysis on Tuesday, Thursdays, and Saturdays.    Past Medical History  Diagnosis Date  . 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 Chronic back pain   . CAD (coronary artery disease)   . Obesity   . Peripheral vascular disease hands and legs  . Anxiety   . Asthma   . GERD (gastroesophageal reflux disease)     takes Omeprazole daily  . Chronic constipation     takes MIralax and  Colace daily  . Anemia     takes Folic Acid daily  . Hypothyroidism     takes Synthroid daily  . Depression     takes Cymbalta for "severe" depression  . Neuromuscular disorder     carpal tunnel in right hand  . Hypertension     takes Metoprolol and Lisinopril daily, sees Dr Bea Graff  . COPD (chronic obstructive pulmonary disease)   . Pneumonia     "today and several times before" (08/30/2012)  . Chronic bronchitis     "q yr w/season changes" (08/30/2012)  . Type II diabetes mellitus     Levemir 2units daily if > 150  . History of blood transfusion     "several" (08/30/2012)  . Hepatitis C   . Migraine     "last migraine was in 2013" (08/30/2012)  . Headache     "at least monthly" (08/30/2012)  . Arthritis     "left hand, back" (08/30/2012)  . End stage renal disease on dialysis 02/27/2011    Kidneys shut down at time of liver transplant in Sept 2011 at [facility] in [place], she has been on HD ever since.  Dialyzes at [facility] HD on TTS schedule.  Had L forearm graft used 10 months then removed Dec 2012 due to suspected infection.  A right upper arm AVG was placed Dec 2012 but she developed steal symptoms acutely and it was ligated the same day.  Never had an AV fistula due to small veins.  Now has L thigh AVG put in Jan 2013, has not clotted to date.    [name] CAD (coronary artery disease) Jan. 2015    Cath: 20% LAD, 50% D1    Past Surgical History  Procedure Laterality Date  . Liver transplant  10/25/2009    sees Dr Ferol Luz 1 every 6 months, saw last in Dec 2013. [name] Coord [phone]  . Small intestine surgery  90's  . Thrombectomy    . Arteriovenous graft placement Left 10/03/10     forearm  . Avgg removal  12/23/2010    Procedure: REMOVAL OF ARTERIOVENOUS GORETEX GRAFT ([place]);  Surgeon: Elam Dutch, MD;  Location: [place];  Service: Vascular;  Laterality: Left;  procedure started @1736 -1852  . Insertion of dialysis catheter  12/23/2010    Procedure: INSERTION OF  DIALYSIS CATHETER;  Surgeon: Elam Dutch, MD;  Location: [place];  Service: Vascular;  Laterality: Right;  Right Internal Jugular 28cm dialysis catheter insertion procedure time 1701-1720   . Cholecystectomy  1993  . Cystoscopy  1990's  . Spinal growth rods  2010    "put 2 metal rods in my back; they had detetriorated" (08/30/2012)  . Av fistula placement  01/29/2011    Procedure: INSERTION OF ARTERIOVENOUS (AV) GORE-TEX GRAFT ARM;  Surgeon: Elam Dutch, MD;  Location: [place];  Service: Vascular;  Laterality: Right;  . Av fistula placement  03/10/2011    Procedure: INSERTION OF ARTERIOVENOUS (AV) GORE-TEX GRAFT THIGH;  Surgeon: Elam Dutch, MD;  Location: [place];  Service: Vascular;  Laterality: Left;  . Tubal ligation  1990's  . Cardiac catheterization  2014  . Aortic valve replacement N/A 03/15/2013    AVR; Surgeon: Ivin Poot, MD;  Location: [facility] OR; Open Heart Surgery;  65mm[employer] mechanical prosthesis, top hat valve  . Coronary artery bypass graft N/A 03/15/2013    Procedure: CORONARY ARTERY BYPASS GRAFTING (CABG) times one using left internal mammary artery.;  Surgeon: Ivin Poot, MD;  Location: [place];  Service: Open Heart Surgery;  Laterality: N/A;  POSS CABG X 1  . Intraoperative transesophageal echocardiogram N/A 03/15/2013    Procedure: INTRAOPERATIVE TRANSESOPHAGEAL ECHOCARDIOGRAM;  Surgeon: Ivin Poot, MD;  Location: [place];  Service: Open Heart Surgery;  Laterality: N/A;    Allergies  Allergen Reactions  . Acetaminophen Other (See Comments)    Liver transplant recipient   . Codeine Itching    Current Outpatient Prescriptions  Medication Sig Dispense Refill  . albuterol (PROVENTIL,VENTOLIN) 90 MCG/ACT inhaler Inhale 1-2 puffs into the lungs every 6 (six) hours as needed for wheezing or shortness of breath.       [name] aspirin EC 81 MG tablet Take 81 mg by mouth daily.        . calcium carbonate (TUMS - DOSED IN MG ELEMENTAL CALCIUM) 500 MG chewable tablet  Chew 1 tablet by mouth daily.      . cinacalcet (SENSIPAR) 30 MG tablet Take 30 mg by mouth daily.      . DULoxetine (CYMBALTA) 60 MG capsule Take 60 mg by mouth at bedtime.       . gabapentin (NEURONTIN) 300 MG capsule Take 300 mg by mouth at bedtime.       . insulin detemir (LEVEMIR) 100 UNIT/ML injection Inject 2 Units into the skin at bedtime as needed (for CBG greater than 150).      [name] levothyroxine (SYNTHROID, LEVOTHROID) 150 MCG tablet Take 150 mcg by mouth daily before breakfast.      .  LORazepam (ATIVAN) 0.5 MG tablet Take 0.5 mg by mouth at bedtime.       . metoprolol (LOPRESSOR) 50 MG tablet Take 1 tablet (50 mg total) by mouth 2 (two) times daily.  60 tablet  11  . Multiple Vitamin (MULTIVITAMIN WITH MINERALS) TABS tablet Take 1 tablet by mouth daily.      . promethazine (PHENERGAN) 12.5 MG tablet Take 12.5 mg by mouth every 4 (four) hours as needed for nausea.       [name] tacrolimus (PROGRAF) 1 MG capsule Take 5 mg by mouth 2 (two) times daily.      . traMADol (ULTRAM) 50 MG tablet 50 mg every 12 (twelve) hours as needed.       . VELPHORO 500 MG chewable tablet Chew 1 tablet by mouth 3 (three) times daily.      [name] warfarin (COUMADIN) 2 MG tablet 4mg  Sun/Tuesday/ Thursday. 2 mg other days.  60 tablet  11  . amLODipine (NORVASC) 5 MG tablet Take 1 tablet daily  on non dialysis days.  30 tablet  6   No current facility-administered medications for this visit.    History   Social History  . Marital Status: Divorced    Spouse Name: N/A    Number of Children: N/A  . Years of Education: N/A   Occupational History  . Not on file.   Social History Main Topics  . Smoking status: Former Smoker -- 0.75 packs/day for 40 years    Types: Cigarettes    Quit date: 03/13/2013  . Smokeless tobacco: Never Used     Comment: still smokng 1 or 2 a day  . Alcohol Use: Yes     Comment: 08/30/2012 "last drink was at a wedding July, 2014; had a small glass of wine; never had problems w/alcohol"  .  Drug Use: No  . Sexual Activity: Not Currently   Other Topics Concern  . Not on file   Social History Narrative  . No narrative on file    Family History  Problem Relation Age of Onset  . Cancer Mother   . Diabetes Mother   . Hypertension Mother   . Stroke Mother   . Cancer Father   . Anesthesia problems Neg Hx   . Hypotension Neg Hx   . Malignant hyperthermia Neg Hx   . Pseudochol deficiency Neg Hx     ROS General: Negative; No fevers, chills, or night sweats HEENT: Negative; No changes in vision or hearing, sinus congestion, difficulty swallowing Pulmonary: Negative; No cough, wheezing, shortness of breath, hemoptysis Cardiovascular: See history of present illness; No chest pain, presyncope, syncope, palpatations GI: Negative; No nausea, vomiting, diarrhea, or abdominal pain GU: Negative; No dysuria, hematuria, or difficulty voiding Musculoskeletal: Negative; no myalgias, joint pain, or weakness Hematologic: Negative; no easy bruising, bleeding Endocrine: Positive for hypothyroidism; no heat/cold intolerance; no diabetes, Neuro: Perform neuropathy; no changes in balance, headaches Skin: Negative; No rashes or skin lesions Psychiatric: Negative; No behavioral problems, depression Sleep: Negative; No snoring,  daytime sleepiness, hypersomnolence, bruxism, restless legs, hypnogognic hallucinations. Other comprehensive 14 point system review is negative    Physical Exam BP 170/82  Pulse 72  Ht 5' 4.5" (1.638 m)  Wt 165 lb 6.4 oz (75.025 kg)  BMI 27.96 kg/m2 General: Alert, oriented, no distress.  Skin: normal turgor, no rashes, warm and dry HEENT: Normocephalic, atraumatic. Pupils equal round and reactive to light; sclera anicteric; extraocular muscles intact, No lid lag; Nose without nasal septal hypertrophy; Mouth/Parynx  benign; Mallinpatti scale 3 Neck: No JVD, no carotid bruits; normal carotid upstroke Lungs: clear to ausculatation and percussion bilaterally; no  wheezing or rales, normal inspiratory and expiratory effort Chest wall: without tenderness to palpitation Heart: PMI not displaced, RRR, s1 s2 normal, 2/6 systolic murmur, crisp mechanical heart sounds; No diastolic murmur, no rubs, gallops, thrills, or heaves Abdomen: soft, nontender; no hepatosplenomehaly, BS+; abdominal aorta nontender and not dilated by palpation. Back: no CVA tenderness Pulses: 2+  Musculoskeletal: full range of motion, normal strength, no joint deformities Extremities: Pulses 2+, no clubbing cyanosis or edema, Homan's sign negative  Neurologic: grossly nonfocal; Cranial nerves grossly wnl Psychologic: Normal mood and affect    ECG (independently read by me): Normal sinus rhythm at 72 beats per minute.  T wave changes in leads 1 and L. and V6.  QTC 486 ms.  LABS:  BMET    Component Value Date/Time   NA 136* 04/11/2013 2008   K 4.7 04/11/2013 2008   CL 94* 04/11/2013 2008   CO2 23 04/11/2013 2008   GLUCOSE 200* 04/11/2013 2008   BUN 57* 04/11/2013 2008   CREATININE 10.06* 04/11/2013 2008   CALCIUM 8.1* 04/11/2013 2008   GFRNONAA 4* 04/11/2013 2008   GFRAA 4* 04/11/2013 2008     Hepatic Function Panel     Component Value Date/Time   PROT 6.9 04/11/2013 2008   ALBUMIN 2.5* 04/11/2013 2008   AST 20 04/11/2013 2008   ALT 14 04/11/2013 2008   ALKPHOS 95 04/11/2013 2008   BILITOT 0.3 04/11/2013 2008   BILIDIR <0.2 03/16/2013 1500   IBILI NOT CALCULATED 03/16/2013 1500     CBC    Component Value Date/Time   WBC 6.9 04/15/2013 0525   RBC 3.23* 04/15/2013 0525   HGB 9.9* 04/15/2013 0525   HCT 30.4* 04/15/2013 0525   PLT 170 04/15/2013 0525   MCV 94.1 04/15/2013 0525   MCH 30.7 04/15/2013 0525   MCHC 32.6 04/15/2013 0525   RDW 16.2* 04/15/2013 0525   LYMPHSABS 2.1 04/10/2013 0125   MONOABS 0.6 04/10/2013 0125   EOSABS 0.4 04/10/2013 0125   BASOSABS 0.0 04/10/2013 0125     BNP    Component Value Date/Time   PROBNP 32077.0* 09/01/2012 0555    Lipid Panel     Component Value Date/Time   CHOL 225*  12/22/2011 1030   TRIG 121 12/22/2011 1030   HDL 97 12/22/2011 1030   CHOLHDL 2.3 12/22/2011 1030   VLDL 24 12/22/2011 1030   LDLCALC 104* 12/22/2011 1030     RADIOLOGY: Dg Chest 2 View  06/01/2013   CLINICAL DATA:  History of aortic valve replacement in February, follow-up  EXAM: CHEST  2 VIEW  COMPARISON:  Chest x-ray 04/20/2013, and CT chest of 03/11/2013  FINDINGS: Cardiomegaly is stable. There is very mild pulmonary vascular congestion present. No effusion is seen. A nodular opacity in the medial left lung apex is stable and compared to the prior CT chest represents a bony density from the upper thoracic spine. Median sternotomy sutures remain and an aortic valve replacement is present. No acute bony abnormality is seen. Multiple surgical clips are present within the epigastrium.  IMPRESSION: 1. Stable cardiomegaly. 2. Mild pulmonary vascular congestion.   Electronically Signed   By: Ivar Drape M.D.   On: 06/01/2013 09:40      ASSESSMENT AND PLAN:  Ms. [name] underwent mechanical aortic valve replacement for moderately severe symptomatic aortic stenosis and single-vessel LIMA to LAD CABG revascularization  surgery.  She had been readmitted with episodes of tachycardia and shortness of breath.  Presently, her resting pulse is in the 70s, and she's not having any further episodes of tachycardia on her current dose of metoprolol 50 mg twice a day.  However, blood pressure continues to be elevated and was 170/82 today.  I have elected to add amlodipine 5 mg for her to take on her nondialysis days.  Eric valve replacement sounds are crisp.  She does note more energy since her heart surgery.  Her INR is being followed at dialysis with a goal of 2.5-3.0.  I will see her in 4 months for followup evaluation or sooner if problems arise.    Troy Sine, MD, [facility]  06/28/2013 7:56 PM

## 2013-07-03 ENCOUNTER — Inpatient Hospital Stay (HOSPITAL_COMMUNITY)
Admission: EM | Admit: 2013-07-03 | Discharge: 2013-07-07 | DRG: 186 | Disposition: A | Discharge status: Home / Self Care | Payer: Medicare Other | Source: Other Acute Inpatient Hospital | Attending: Internal Medicine | Admitting: Internal Medicine

## 2013-07-03 DIAGNOSIS — D649 Anemia, unspecified: Secondary | ICD-10-CM | POA: Diagnosis present

## 2013-07-03 DIAGNOSIS — I471 Supraventricular tachycardia, unspecified: Secondary | ICD-10-CM

## 2013-07-03 DIAGNOSIS — R0989 Other specified symptoms and signs involving the circulatory and respiratory systems: Secondary | ICD-10-CM | POA: Diagnosis not present

## 2013-07-03 DIAGNOSIS — I119 Hypertensive heart disease without heart failure: Secondary | ICD-10-CM | POA: Diagnosis present

## 2013-07-03 DIAGNOSIS — R0602 Shortness of breath: Secondary | ICD-10-CM | POA: Diagnosis not present

## 2013-07-03 DIAGNOSIS — I359 Nonrheumatic aortic valve disorder, unspecified: Secondary | ICD-10-CM | POA: Diagnosis not present

## 2013-07-03 DIAGNOSIS — K5900 Constipation, unspecified: Secondary | ICD-10-CM | POA: Diagnosis present

## 2013-07-03 DIAGNOSIS — Z823 Family history of stroke: Secondary | ICD-10-CM | POA: Diagnosis not present

## 2013-07-03 DIAGNOSIS — I350 Nonrheumatic aortic (valve) stenosis: Secondary | ICD-10-CM

## 2013-07-03 DIAGNOSIS — D539 Nutritional anemia, unspecified: Secondary | ICD-10-CM

## 2013-07-03 DIAGNOSIS — E119 Type 2 diabetes mellitus without complications: Secondary | ICD-10-CM

## 2013-07-03 DIAGNOSIS — J9600 Acute respiratory failure, unspecified whether with hypoxia or hypercapnia: Secondary | ICD-10-CM

## 2013-07-03 DIAGNOSIS — I10 Essential (primary) hypertension: Secondary | ICD-10-CM | POA: Diagnosis not present

## 2013-07-03 DIAGNOSIS — J810 Acute pulmonary edema: Secondary | ICD-10-CM

## 2013-07-03 DIAGNOSIS — J9819 Other pulmonary collapse: Secondary | ICD-10-CM | POA: Diagnosis not present

## 2013-07-03 DIAGNOSIS — B1920 Unspecified viral hepatitis C without hepatic coma: Secondary | ICD-10-CM | POA: Diagnosis present

## 2013-07-03 DIAGNOSIS — F329 Major depressive disorder, single episode, unspecified: Secondary | ICD-10-CM | POA: Diagnosis present

## 2013-07-03 DIAGNOSIS — Z87891 Personal history of nicotine dependence: Secondary | ICD-10-CM

## 2013-07-03 DIAGNOSIS — Z833 Family history of diabetes mellitus: Secondary | ICD-10-CM

## 2013-07-03 DIAGNOSIS — M899 Disorder of bone, unspecified: Secondary | ICD-10-CM | POA: Diagnosis present

## 2013-07-03 DIAGNOSIS — E785 Hyperlipidemia, unspecified: Secondary | ICD-10-CM

## 2013-07-03 DIAGNOSIS — Z944 Liver transplant status: Secondary | ICD-10-CM

## 2013-07-03 DIAGNOSIS — I120 Hypertensive chronic kidney disease with stage 5 chronic kidney disease or end stage renal disease: Secondary | ICD-10-CM | POA: Diagnosis present

## 2013-07-03 DIAGNOSIS — E1122 Type 2 diabetes mellitus with diabetic chronic kidney disease: Secondary | ICD-10-CM | POA: Diagnosis present

## 2013-07-03 DIAGNOSIS — I2510 Atherosclerotic heart disease of native coronary artery without angina pectoris: Secondary | ICD-10-CM

## 2013-07-03 DIAGNOSIS — I5042 Chronic combined systolic (congestive) and diastolic (congestive) heart failure: Secondary | ICD-10-CM

## 2013-07-03 DIAGNOSIS — Z720 Tobacco use: Secondary | ICD-10-CM

## 2013-07-03 DIAGNOSIS — B1710 Acute hepatitis C without hepatic coma: Secondary | ICD-10-CM

## 2013-07-03 DIAGNOSIS — Z9119 Patient's noncompliance with other medical treatment and regimen: Secondary | ICD-10-CM

## 2013-07-03 DIAGNOSIS — Z992 Dependence on renal dialysis: Secondary | ICD-10-CM

## 2013-07-03 DIAGNOSIS — R002 Palpitations: Secondary | ICD-10-CM

## 2013-07-03 DIAGNOSIS — Z7901 Long term (current) use of anticoagulants: Secondary | ICD-10-CM

## 2013-07-03 DIAGNOSIS — Z951 Presence of aortocoronary bypass graft: Secondary | ICD-10-CM

## 2013-07-03 DIAGNOSIS — N186 End stage renal disease: Secondary | ICD-10-CM | POA: Diagnosis not present

## 2013-07-03 DIAGNOSIS — I509 Heart failure, unspecified: Secondary | ICD-10-CM

## 2013-07-03 DIAGNOSIS — R0902 Hypoxemia: Secondary | ICD-10-CM | POA: Diagnosis not present

## 2013-07-03 DIAGNOSIS — R079 Chest pain, unspecified: Secondary | ICD-10-CM

## 2013-07-03 DIAGNOSIS — F3289 Other specified depressive episodes: Secondary | ICD-10-CM | POA: Diagnosis present

## 2013-07-03 DIAGNOSIS — Z8249 Family history of ischemic heart disease and other diseases of the circulatory system: Secondary | ICD-10-CM | POA: Diagnosis not present

## 2013-07-03 DIAGNOSIS — I5023 Acute on chronic systolic (congestive) heart failure: Secondary | ICD-10-CM | POA: Diagnosis not present

## 2013-07-03 DIAGNOSIS — N2581 Secondary hyperparathyroidism of renal origin: Secondary | ICD-10-CM | POA: Diagnosis present

## 2013-07-03 DIAGNOSIS — E039 Hypothyroidism, unspecified: Secondary | ICD-10-CM

## 2013-07-03 DIAGNOSIS — Z91199 Patient's noncompliance with other medical treatment and regimen due to unspecified reason: Secondary | ICD-10-CM | POA: Diagnosis not present

## 2013-07-03 DIAGNOSIS — R0609 Other forms of dyspnea: Secondary | ICD-10-CM | POA: Diagnosis not present

## 2013-07-03 DIAGNOSIS — N189 Chronic kidney disease, unspecified: Secondary | ICD-10-CM | POA: Diagnosis not present

## 2013-07-03 DIAGNOSIS — R9431 Abnormal electrocardiogram [ECG] [EKG]: Secondary | ICD-10-CM

## 2013-07-03 DIAGNOSIS — K219 Gastro-esophageal reflux disease without esophagitis: Secondary | ICD-10-CM

## 2013-07-03 DIAGNOSIS — I129 Hypertensive chronic kidney disease with stage 1 through stage 4 chronic kidney disease, or unspecified chronic kidney disease: Secondary | ICD-10-CM | POA: Diagnosis not present

## 2013-07-03 DIAGNOSIS — Z952 Presence of prosthetic heart valve: Secondary | ICD-10-CM

## 2013-07-03 DIAGNOSIS — E8779 Other fluid overload: Secondary | ICD-10-CM | POA: Diagnosis not present

## 2013-07-03 DIAGNOSIS — Z954 Presence of other heart-valve replacement: Secondary | ICD-10-CM

## 2013-07-03 DIAGNOSIS — J189 Pneumonia, unspecified organism: Secondary | ICD-10-CM

## 2013-07-03 DIAGNOSIS — J441 Chronic obstructive pulmonary disease with (acute) exacerbation: Secondary | ICD-10-CM

## 2013-07-03 DIAGNOSIS — J90 Pleural effusion, not elsewhere classified: Principal | ICD-10-CM

## 2013-07-03 DIAGNOSIS — Z79899 Other long term (current) drug therapy: Secondary | ICD-10-CM | POA: Diagnosis not present

## 2013-07-03 DIAGNOSIS — J449 Chronic obstructive pulmonary disease, unspecified: Secondary | ICD-10-CM

## 2013-07-03 DIAGNOSIS — E7800 Pure hypercholesterolemia, unspecified: Secondary | ICD-10-CM | POA: Diagnosis not present

## 2013-07-03 DIAGNOSIS — M949 Disorder of cartilage, unspecified: Secondary | ICD-10-CM

## 2013-07-03 DIAGNOSIS — Z794 Long term (current) use of insulin: Secondary | ICD-10-CM | POA: Diagnosis not present

## 2013-07-03 DIAGNOSIS — I209 Angina pectoris, unspecified: Secondary | ICD-10-CM

## 2013-07-03 DIAGNOSIS — I25119 Atherosclerotic heart disease of native coronary artery with unspecified angina pectoris: Secondary | ICD-10-CM

## 2013-07-03 DIAGNOSIS — J4489 Other specified chronic obstructive pulmonary disease: Secondary | ICD-10-CM | POA: Diagnosis present

## 2013-07-03 LAB — PROTIME-INR
INR: 2.21 — ABNORMAL HIGH (ref 0.00–1.49)
Prothrombin Time: 23.8 seconds — ABNORMAL HIGH (ref 11.6–15.2)

## 2013-07-03 LAB — CBC WITH DIFFERENTIAL/PLATELET
BASOS ABS: 0 10*3/uL (ref 0.0–0.1)
BASOS PCT: 0 % (ref 0–1)
Eosinophils Absolute: 0 10*3/uL (ref 0.0–0.7)
Eosinophils Relative: 0 % (ref 0–5)
HEMATOCRIT: 35.4 % — AB (ref 36.0–46.0)
Hemoglobin: 11.6 g/dL — ABNORMAL LOW (ref 12.0–15.0)
Lymphocytes Relative: 10 % — ABNORMAL LOW (ref 12–46)
Lymphs Abs: 0.5 10*3/uL — ABNORMAL LOW (ref 0.7–4.0)
MCH: 31 pg (ref 26.0–34.0)
MCHC: 32.8 g/dL (ref 30.0–36.0)
MCV: 94.7 fL (ref 78.0–100.0)
MONO ABS: 0.1 10*3/uL (ref 0.1–1.0)
Monocytes Relative: 2 % — ABNORMAL LOW (ref 3–12)
Neutro Abs: 4.5 10*3/uL (ref 1.7–7.7)
Neutrophils Relative %: 88 % — ABNORMAL HIGH (ref 43–77)
PLATELETS: 171 10*3/uL (ref 150–400)
RBC: 3.74 MIL/uL — ABNORMAL LOW (ref 3.87–5.11)
RDW: 15.2 % (ref 11.5–15.5)
WBC: 5.1 10*3/uL (ref 4.0–10.5)

## 2013-07-03 LAB — MRSA PCR SCREENING: MRSA BY PCR: NEGATIVE

## 2013-07-03 LAB — COMPREHENSIVE METABOLIC PANEL
ALBUMIN: 3 g/dL — AB (ref 3.5–5.2)
ALT: 11 U/L (ref 0–35)
AST: 21 U/L (ref 0–37)
Alkaline Phosphatase: 80 U/L (ref 39–117)
BUN: 25 mg/dL — ABNORMAL HIGH (ref 6–23)
CO2: 24 mEq/L (ref 19–32)
CREATININE: 6.21 mg/dL — AB (ref 0.50–1.10)
Calcium: 8.5 mg/dL (ref 8.4–10.5)
Chloride: 93 mEq/L — ABNORMAL LOW (ref 96–112)
GFR calc Af Amer: 8 mL/min — ABNORMAL LOW (ref >90)
GFR calc non Af Amer: 7 mL/min — ABNORMAL LOW (ref >90)
Glucose, Bld: 338 mg/dL — ABNORMAL HIGH (ref 70–99)
Potassium: 3.9 mEq/L (ref 3.7–5.3)
Sodium: 134 mEq/L — ABNORMAL LOW (ref 137–147)
TOTAL PROTEIN: 7.3 g/dL (ref 6.0–8.3)
Total Bilirubin: 0.7 mg/dL (ref 0.3–1.2)

## 2013-07-03 LAB — TROPONIN I

## 2013-07-03 LAB — PHOSPHORUS: Phosphorus: 5.9 mg/dL — ABNORMAL HIGH (ref 2.3–4.6)

## 2013-07-03 LAB — TSH: TSH: 0.086 u[IU]/mL — ABNORMAL LOW (ref 0.350–4.500)

## 2013-07-03 LAB — MAGNESIUM: Magnesium: 2 mg/dL (ref 1.5–2.5)

## 2013-07-03 LAB — APTT: APTT: 47 s — AB (ref 24–37)

## 2013-07-03 MED ORDER — ALBUTEROL SULFATE (2.5 MG/3ML) 0.083% IN NEBU: 2.5000 mg | INHALATION_SOLUTION | Freq: Four times a day (QID) | RESPIRATORY_TRACT | Status: DC | PRN

## 2013-07-03 MED ORDER — DULOXETINE HCL 60 MG PO CPEP
60.0000 mg | ORAL_CAPSULE | Freq: Every day | ORAL | Status: DC
  Administered 2013-07-03 – 2013-07-06 (×4): 60 mg via ORAL
  Filled 2013-07-03 (×5): qty 1

## 2013-07-03 MED ORDER — ZOLPIDEM TARTRATE 5 MG PO TABS
5.0000 mg | ORAL_TABLET | Freq: Every evening | ORAL | Status: DC | PRN
  Administered 2013-07-04: 5 mg via ORAL
  Filled 2013-07-03: qty 1

## 2013-07-03 MED ORDER — GABAPENTIN 300 MG PO CAPS
300.0000 mg | ORAL_CAPSULE | Freq: Every day | ORAL | Status: DC
  Administered 2013-07-03 – 2013-07-06 (×4): 300 mg via ORAL
  Filled 2013-07-03 (×5): qty 1

## 2013-07-03 MED ORDER — WARFARIN SODIUM 6 MG PO TABS
6.0000 mg | ORAL_TABLET | ORAL | Status: DC
  Filled 2013-07-03: qty 1

## 2013-07-03 MED ORDER — WARFARIN - PHARMACIST DOSING INPATIENT
Freq: Every day | Status: DC
  Administered 2013-07-06 – 2013-07-07 (×2)

## 2013-07-03 MED ORDER — FERROUS SULFATE 325 (65 FE) MG PO TABS
325.0000 mg | ORAL_TABLET | Freq: Three times a day (TID) | ORAL | Status: DC
  Administered 2013-07-04 – 2013-07-05 (×3): 325 mg via ORAL
  Filled 2013-07-03 (×7): qty 1

## 2013-07-03 MED ORDER — LEVOTHYROXINE SODIUM 150 MCG PO TABS
150.0000 ug | ORAL_TABLET | Freq: Every day | ORAL | Status: DC
  Administered 2013-07-04 – 2013-07-06 (×2): 150 ug via ORAL
  Filled 2013-07-03 (×5): qty 1

## 2013-07-03 MED ORDER — INSULIN ASPART 100 UNIT/ML ~~LOC~~ SOLN
0.0000 [IU] | Freq: Every day | SUBCUTANEOUS | Status: DC
  Administered 2013-07-03: 4 [IU] via SUBCUTANEOUS
  Administered 2013-07-04: 2 [IU] via SUBCUTANEOUS

## 2013-07-03 MED ORDER — LORAZEPAM 0.5 MG PO TABS
0.5000 mg | ORAL_TABLET | Freq: Every day | ORAL | Status: DC
  Administered 2013-07-03 – 2013-07-06 (×4): 0.5 mg via ORAL
  Filled 2013-07-03 (×4): qty 1

## 2013-07-03 MED ORDER — AMLODIPINE BESYLATE 5 MG PO TABS
5.0000 mg | ORAL_TABLET | Freq: Every day | ORAL | Status: DC
  Administered 2013-07-03 – 2013-07-07 (×4): 5 mg via ORAL
  Filled 2013-07-03 (×5): qty 1

## 2013-07-03 MED ORDER — INSULIN ASPART 100 UNIT/ML ~~LOC~~ SOLN
0.0000 [IU] | Freq: Three times a day (TID) | SUBCUTANEOUS | Status: DC
  Administered 2013-07-04: 3 [IU] via SUBCUTANEOUS
  Administered 2013-07-05: 2 [IU] via SUBCUTANEOUS
  Administered 2013-07-05 – 2013-07-06 (×4): 1 [IU] via SUBCUTANEOUS
  Administered 2013-07-07 (×2): 2 [IU] via SUBCUTANEOUS

## 2013-07-03 MED ORDER — LIDOCAINE 5 % EX PTCH
1.0000 | MEDICATED_PATCH | CUTANEOUS | Status: DC
  Administered 2013-07-03 – 2013-07-06 (×4): 1 via TRANSDERMAL
  Filled 2013-07-03 (×5): qty 1

## 2013-07-03 MED ORDER — ALBUTEROL 90 MCG/ACT IN AERS: 1.0000 | INHALATION_SPRAY | Freq: Four times a day (QID) | RESPIRATORY_TRACT | Status: DC | PRN

## 2013-07-03 MED ORDER — METOPROLOL TARTRATE 50 MG PO TABS
50.0000 mg | ORAL_TABLET | Freq: Two times a day (BID) | ORAL | Status: DC
  Administered 2013-07-03 – 2013-07-07 (×7): 50 mg via ORAL
  Filled 2013-07-03 (×9): qty 1

## 2013-07-03 MED ORDER — ONDANSETRON HCL 4 MG/2ML IJ SOLN: 4.0000 mg | Freq: Four times a day (QID) | INTRAMUSCULAR | Status: DC | PRN

## 2013-07-03 MED ORDER — VANCOMYCIN HCL IN DEXTROSE 1-5 GM/200ML-% IV SOLN
1000.0000 mg | Freq: Once | INTRAVENOUS | Status: AC
  Administered 2013-07-03: 1000 mg via INTRAVENOUS
  Filled 2013-07-03: qty 200

## 2013-07-03 MED ORDER — PIPERACILLIN-TAZOBACTAM IN DEX 2-0.25 GM/50ML IV SOLN
2.2500 g | Freq: Three times a day (TID) | INTRAVENOUS | Status: DC
  Administered 2013-07-03 – 2013-07-04 (×3): 2.25 g via INTRAVENOUS
  Filled 2013-07-03 (×5): qty 50

## 2013-07-03 MED ORDER — SODIUM CHLORIDE 0.9 % IJ SOLN
3.0000 mL | Freq: Two times a day (BID) | INTRAMUSCULAR | Status: DC
  Administered 2013-07-03 – 2013-07-06 (×6): 3 mL via INTRAVENOUS

## 2013-07-03 MED ORDER — TACROLIMUS 1 MG PO CAPS
5.0000 mg | ORAL_CAPSULE | Freq: Two times a day (BID) | ORAL | Status: DC
  Administered 2013-07-03 – 2013-07-07 (×7): 5 mg via ORAL
  Filled 2013-07-03 (×9): qty 5

## 2013-07-03 MED ORDER — CINACALCET HCL 30 MG PO TABS
30.0000 mg | ORAL_TABLET | Freq: Every day | ORAL | Status: DC
  Administered 2013-07-04 – 2013-07-07 (×3): 30 mg via ORAL
  Filled 2013-07-03 (×4): qty 1

## 2013-07-03 MED ORDER — SUCROFERRIC OXYHYDROXIDE 500 MG PO CHEW: 500.0000 mg | CHEWABLE_TABLET | Freq: Three times a day (TID) | ORAL | Status: DC

## 2013-07-03 MED ORDER — ONDANSETRON HCL 4 MG PO TABS: 4.0000 mg | ORAL_TABLET | Freq: Four times a day (QID) | ORAL | Status: DC | PRN

## 2013-07-03 MED ORDER — PROMETHAZINE HCL 25 MG PO TABS: 12.5000 mg | ORAL_TABLET | ORAL | Status: DC | PRN

## 2013-07-03 MED ORDER — WARFARIN SODIUM 3 MG PO TABS
3.0000 mg | ORAL_TABLET | ORAL | Status: DC
  Filled 2013-07-03 (×2): qty 1

## 2013-07-03 MED ORDER — ASPIRIN EC 81 MG PO TBEC
81.0000 mg | DELAYED_RELEASE_TABLET | Freq: Every day | ORAL | Status: DC
  Administered 2013-07-03 – 2013-07-07 (×4): 81 mg via ORAL
  Filled 2013-07-03 (×5): qty 1

## 2013-07-03 MED ORDER — TRAMADOL HCL 50 MG PO TABS: 50.0000 mg | ORAL_TABLET | Freq: Four times a day (QID) | ORAL | Status: DC | PRN

## 2013-07-03 NOTE — Progress Notes (Signed)
ANTIBIOTIC CONSULT NOTE - INITIAL  Pharmacy Consult for Vancomycin/Zosyn Indication: rule out pneumonia  Allergies  Allergen Reactions  . Acetaminophen Other (See Comments)    Liver transplant recipient   . Codeine Itching    Patient Measurements: Height: 5\' 4"  (162.6 cm) Weight: 171 lb 15.3 oz (78 kg) IBW/kg (Calculated) : 54.7  Vital Signs: Temp: 98.4 F (36.9 C) (05/24 2027) Temp src: Oral (05/24 2027) BP: 127/98 mmHg (05/24 2027) Pulse Rate: 75 (05/24 2027)  Labs:  Recent Labs  07/03/13 2200  WBC 5.1  HGB 11.6*  PLT 171   Estimated Creatinine Clearance: 6.2 ml/min (by C-G formula based on Cr of 10.06).  Microbiology: Recent Results (from the past 720 hour(s))  MRSA PCR SCREENING     Status: None   Collection Time    07/03/13  8:21 PM      Result Value Ref Range Status   MRSA by PCR NEGATIVE  NEGATIVE Final   Comment:            The GeneXpert MRSA Assay (FDA     approved for NASAL specimens     only), is one component of a     comprehensive MRSA colonization     surveillance program. It is not     intended to diagnose MRSA     infection nor to guide or     monitor treatment for     MRSA infections.   Medical History: Past Medical History  Diagnosis Date  . 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 Chronic back pain   . CAD (coronary artery disease)   . Obesity   . Peripheral vascular disease hands and legs  . Anxiety   . Asthma   . GERD (gastroesophageal reflux disease)     takes Omeprazole daily  . Chronic constipation     takes MIralax and Colace daily  . Anemia     takes Folic Acid daily  . Hypothyroidism     takes Synthroid daily  . Depression     takes Cymbalta for "severe" depression  . Neuromuscular disorder     carpal tunnel in right hand  . Hypertension     takes Metoprolol and Lisinopril daily, sees Dr Bea Graff  . COPD (chronic obstructive pulmonary  disease)   . Pneumonia     "today and several times before" (08/30/2012)  . Chronic bronchitis     "q yr w/season changes" (08/30/2012)  . Type II diabetes mellitus     Levemir 2units daily if > 150  . History of blood transfusion     "several" (08/30/2012)  . Hepatitis C   . Migraine     "last migraine was in 2013" (08/30/2012)  . Headache     "at least monthly" (08/30/2012)  . Arthritis     "left hand, back" (08/30/2012)  . End stage renal disease on dialysis 02/27/2011    Kidneys shut down at time of liver transplant in Sept 2011 at [facility] in [place], she has been on HD ever since.  Dialyzes at [facility] HD on TTS schedule.  Had L forearm graft used 10 months then removed Dec 2012 due to suspected infection.  A right upper arm AVG was placed Dec 2012 but she developed steal symptoms acutely and it was ligated the same day.  Never had an AV fistula due to small veins.  Now  has L thigh AVG put in Jan 2013, has not clotted to date.    [name] CAD (coronary artery disease) Jan. 2015    Cath: 20% LAD, 50% D1   Medications:  Anti-infectives   None     Assessment: 58 yo female admitted with c/o chest pain.  She is being followed by cardiology and we have been asked to start IV antibiotics empirically for r/o pneumonia.  She has a history of ESRD with HD on TTS.  She is afebrile, no CBC resulted yet.  Goal of Therapy:  Vancomycin trough level 15-20 mcg/ml  Plan:  1.  Will give Vancomycin 1 gm IV x 1 2.  Zosyn 2.25 gm IV every 8 hours 3.  F/U labs, culture data, s/s of infection and LOT for antibiotics  Rober Minion, PharmD., MS Clinical Pharmacist Pager:  [phone] Thank you for allowing pharmacy to be part of this patients care team. 07/03/2013,10:14 PM

## 2013-07-03 NOTE — Progress Notes (Addendum)
ANTICOAGULATION CONSULT NOTE - Initial Consult  Pharmacy Consult for Warfarin Indication: Mechanical Valve  Allergies  Allergen Reactions  . Acetaminophen Other (See Comments)    Liver transplant recipient   . Codeine Itching   Patient Measurements: Height: 5\' 4"  (162.6 cm) Weight: 171 lb 15.3 oz (78 kg) IBW/kg (Calculated) : 54.7  Vital Signs: Temp: 98.4 F (36.9 C) (05/24 2027) Temp src: Oral (05/24 2027) BP: 127/98 mmHg (05/24 2027) Pulse Rate: 75 (05/24 2027)  Labs: No results found for this basename: HGB, HCT, PLT, APTT, LABPROT, INR, HEPARINUNFRC, CREATININE, CKTOTAL, CKMB, TROPONINI,  in the last 72 hours  Estimated Creatinine Clearance: 6.2 ml/min (by C-G formula based on Cr of 10.06).  Medical History: Past Medical History  Diagnosis Date  . 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 Chronic back pain   . CAD (coronary artery disease)   . Obesity   . Peripheral vascular disease hands and legs  . Anxiety   . Asthma   . GERD (gastroesophageal reflux disease)     takes Omeprazole daily  . Chronic constipation     takes MIralax and Colace daily  . Anemia     takes Folic Acid daily  . Hypothyroidism     takes Synthroid daily  . Depression     takes Cymbalta for "severe" depression  . Neuromuscular disorder     carpal tunnel in right hand  . Hypertension     takes Metoprolol and Lisinopril daily, sees Dr Bea Graff  . COPD (chronic obstructive pulmonary disease)   . Pneumonia     "today and several times before" (08/30/2012)  . Chronic bronchitis     "q yr w/season changes" (08/30/2012)  . Type II diabetes mellitus     Levemir 2units daily if > 150  . History of blood transfusion     "several" (08/30/2012)  . Hepatitis C   . Migraine     "last migraine was in 2013" (08/30/2012)  . Headache     "at least monthly" (08/30/2012)  . Arthritis     "left hand, back" (08/30/2012)  . End  stage renal disease on dialysis 02/27/2011    Kidneys shut down at time of liver transplant in Sept 2011 at [facility] in [place], she has been on HD ever since.  Dialyzes at [facility] HD on TTS schedule.  Had L forearm graft used 10 months then removed Dec 2012 due to suspected infection.  A right upper arm AVG was placed Dec 2012 but she developed steal symptoms acutely and it was ligated the same day.  Never had an AV fistula due to small veins.  Now has L thigh AVG put in Jan 2013, has not clotted to date.    [name] CAD (coronary artery disease) Jan. 2015    Cath: 20% LAD, 50% D1   Medications:  Prescriptions prior to admission  Medication Sig Dispense Refill  . albuterol (PROVENTIL,VENTOLIN) 90 MCG/ACT inhaler Inhale 1-2 puffs into the lungs every 6 (six) hours as needed for wheezing or shortness of breath.       [name] aspirin EC 81 MG tablet Take 81 mg by mouth daily.        . calcium carbonate (TUMS - DOSED IN MG ELEMENTAL CALCIUM) 500 MG chewable tablet Chew 1 tablet by mouth daily.      . cinacalcet (SENSIPAR) 30 MG tablet Take 30  mg by mouth daily.      . DULoxetine (CYMBALTA) 60 MG capsule Take 60 mg by mouth at bedtime.       . gabapentin (NEURONTIN) 300 MG capsule Take 300 mg by mouth at bedtime.       . insulin detemir (LEVEMIR) 100 UNIT/ML injection Inject 2-3 Units into the skin at bedtime as needed (for CBG greater than 150). On sliding scale: if over 150, take 2 units; if over 300, take 3 units; etc.      . levothyroxine (SYNTHROID, LEVOTHROID) 150 MCG tablet Take 150 mcg by mouth daily before breakfast.      . LORazepam (ATIVAN) 0.5 MG tablet Take 0.5 mg by mouth at bedtime.       . metoprolol (LOPRESSOR) 50 MG tablet Take 1 tablet (50 mg total) by mouth 2 (two) times daily.  60 tablet  11  . Multiple Vitamin (MULTIVITAMIN WITH MINERALS) TABS tablet Take 1 tablet by mouth at bedtime.       . promethazine (PHENERGAN) 12.5 MG tablet Take 12.5 mg by mouth every 4 (four) hours as needed for  nausea.       [name] tacrolimus (PROGRAF) 1 MG capsule Take 5 mg by mouth 2 (two) times daily.       . traMADol (ULTRAM) 50 MG tablet Take 50 mg by mouth every 12 (twelve) hours as needed.       . VELPHORO 500 MG chewable tablet Chew 1 tablet by mouth 3 (three) times daily.      [name] warfarin (COUMADIN) 3 MG tablet Take 3-6 mg by mouth daily. Take 1 tablets (3 mg) on Sundays, Mondays, Wednesdays, and Fridays.  On other days, take 2 tablets (6 mg).      [name] amLODipine (NORVASC) 5 MG tablet Take 1 tablet daily  on non dialysis days.  30 tablet  6    Assessment: 58yo female admitted with recurrent chest pain.  She has a PMH of CAD with CABG and MVR.  Her baseline labs have been ordered but are not yet resulted.  Goal of Therapy:  INR 2.5-3.0 (per MD) Monitor platelets by anticoagulation protocol: Yes   Plan:  1.  Will f/u baseline labs and dose Warfarin once results are known. 2.  Monitor for bleeding complications 3.  Daily PT/INR  Rober Minion, PharmD., MS Clinical Pharmacist Pager:  [phone] Thank you for allowing pharmacy to be part of this patients care team. 07/03/2013,10:03 PM  Addendum: Labs are back and INR is within desired range.  Will continue her home regimen of Warfarin 3 mg on Su/Mon/Wed/Fri and 6 mg on Sat/Tu/Th.  Rober Minion, PharmD., MS Clinical Pharmacist Pager:  [phone] Thank you for allowing pharmacy to be part of this patients care team.   Addendum  Patient's home dose is warfarin 2 mg po on Sun/Mon/Wed/Fri and 6 mg on Sat/Tue/Thurs, medication order has been changed to reflect this.    Hughes Better, PharmD, BCPS Clinical Pharmacist Pager: [phone] 07/04/2013 6:09 PM

## 2013-07-03 NOTE — H&P (Signed)
[facility] History and Physical  Patient: [name]  [id number]  DOB: Feb 03, 1956  DOS: the patient was seen and examined on 07/03/2013 PCP: Charlynn Court, NP  Chief Complaint: CHEST PAIN  HPI: [name] is a 58 y.o. female with Past medical history of coronary artery disease, aortic stenosis, status post aVR and CABG, hypertension, hepatitis C status post liver transplant, ESRD on hemodialysis Tuesday Thursday Saturday, hypothyroidism,. The patient presented with complaints of shortness of breath and cough. She mentions that she went to hemodialysis Saturday and was at her baseline. After reaching home in the night when she was trying to sleep she started having trouble breathing when she was lying down flat. She tried to raise the number of pillows which initially helped but later on she continues to have progressively worsening shortness of breath. She also felt some palpitation at that point. She started having some dry cough since then and therefore decided to sit up which improved her cough as well as shortness of breath. She had bilateral chest pain which was pleuritic in nature stabbing and sharp worsening with deep breath. She denies any fever or chills. She complains of some dizziness and nausea but no vomiting. She denies any focal neurological deficit. She denies any recent change in her medications. The next day on Sunday she started having progressively worsening shortness of breath on exertion and therefore she went to the ED. In [place] [facility] CT PE WAS NEGATIVE for pulmonary embolism. Patient was transferred here for further workup. She mentions that her symptoms have stabilized and have not worsened.  The patient is coming from home And at her baseline independent for most of her ADL.  Review of Systems: as mentioned in the history of present illness.  A Comprehensive review of the other systems is negative.  Past Medical History  Diagnosis Date  .  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 Chronic back pain   . CAD (coronary artery disease)   . Obesity   . Peripheral vascular disease hands and legs  . Anxiety   . Asthma   . GERD (gastroesophageal reflux disease)     takes Omeprazole daily  . Chronic constipation     takes MIralax and Colace daily  . Anemia     takes Folic Acid daily  . Hypothyroidism     takes Synthroid daily  . Depression     takes Cymbalta for "severe" depression  . Neuromuscular disorder     carpal tunnel in right hand  . Hypertension     takes Metoprolol and Lisinopril daily, sees Dr Bea Graff  . COPD (chronic obstructive pulmonary disease)   . Pneumonia     "today and several times before" (08/30/2012)  . Chronic bronchitis     "q yr w/season changes" (08/30/2012)  . Type II diabetes mellitus     Levemir 2units daily if > 150  . History of blood transfusion     "several" (08/30/2012)  . Hepatitis C   . Migraine     "last migraine was in 2013" (08/30/2012)  . Headache     "at least monthly" (08/30/2012)  . Arthritis     "left hand, back" (08/30/2012)  . End stage renal disease on dialysis 02/27/2011    Kidneys shut down at time of liver transplant in Sept 2011 at [facility] in [place], she has been  on HD ever since.  Dialyzes at [facility] HD on TTS schedule.  Had L forearm graft used 10 months then removed Dec 2012 due to suspected infection.  A right upper arm AVG was placed Dec 2012 but she developed steal symptoms acutely and it was ligated the same day.  Never had an AV fistula due to small veins.  Now has L thigh AVG put in Jan 2013, has not clotted to date.    [name] CAD (coronary artery disease) Jan. 2015    Cath: 20% LAD, 50% D1   Past Surgical History  Procedure Laterality Date  . Liver transplant  10/25/2009    sees Dr Ferol Luz 1 every 6 months, saw last in Dec 2013. [name] Coord [phone]  . Small intestine surgery   90's  . Thrombectomy    . Arteriovenous graft placement Left 10/03/10     forearm  . Avgg removal  12/23/2010    Procedure: REMOVAL OF ARTERIOVENOUS GORETEX GRAFT ([place]);  Surgeon: Elam Dutch, MD;  Location: [place];  Service: Vascular;  Laterality: Left;  procedure started @1736 -1852  . Insertion of dialysis catheter  12/23/2010    Procedure: INSERTION OF DIALYSIS CATHETER;  Surgeon: Elam Dutch, MD;  Location: [place];  Service: Vascular;  Laterality: Right;  Right Internal Jugular 28cm dialysis catheter insertion procedure time 1701-1720   . Cholecystectomy  1993  . Cystoscopy  1990's  . Spinal growth rods  2010    "put 2 metal rods in my back; they had detetriorated" (08/30/2012)  . Av fistula placement  01/29/2011    Procedure: INSERTION OF ARTERIOVENOUS (AV) GORE-TEX GRAFT ARM;  Surgeon: Elam Dutch, MD;  Location: [place];  Service: Vascular;  Laterality: Right;  . Av fistula placement  03/10/2011    Procedure: INSERTION OF ARTERIOVENOUS (AV) GORE-TEX GRAFT THIGH;  Surgeon: Elam Dutch, MD;  Location: [place];  Service: Vascular;  Laterality: Left;  . Tubal ligation  1990's  . Cardiac catheterization  2014  . Aortic valve replacement N/A 03/15/2013    AVR; Surgeon: Ivin Poot, MD;  Location: [facility] OR; Open Heart Surgery;  57mm[employer] mechanical prosthesis, top hat valve  . Coronary artery bypass graft N/A 03/15/2013    Procedure: CORONARY ARTERY BYPASS GRAFTING (CABG) times one using left internal mammary artery.;  Surgeon: Ivin Poot, MD;  Location: [place];  Service: Open Heart Surgery;  Laterality: N/A;  POSS CABG X 1  . Intraoperative transesophageal echocardiogram N/A 03/15/2013    Procedure: INTRAOPERATIVE TRANSESOPHAGEAL ECHOCARDIOGRAM;  Surgeon: Ivin Poot, MD;  Location: [place];  Service: Open Heart Surgery;  Laterality: N/A;   Social History:  reports that she quit smoking about 3 months ago. Her smoking use included Cigarettes. She has a 30 pack-year  smoking history. She has never used smokeless tobacco. She reports that she drinks alcohol. She reports that she does not use illicit drugs.  Allergies  Allergen Reactions  . Acetaminophen Other (See Comments)    Liver transplant recipient   . Codeine Itching    Family History  Problem Relation Age of Onset  . Cancer Mother   . Diabetes Mother   . Hypertension Mother   . Stroke Mother   . Cancer Father   . Anesthesia problems Neg Hx   . Hypotension Neg Hx   . Malignant hyperthermia Neg Hx   . Pseudochol deficiency Neg Hx     Prior to Admission medications   Medication Sig Start Date End  Date Taking? Authorizing Provider  albuterol (PROVENTIL,VENTOLIN) 90 MCG/ACT inhaler Inhale 1-2 puffs into the lungs every 6 (six) hours as needed for wheezing or shortness of breath.    Yes Historical Provider, MD  aspirin EC 81 MG tablet Take 81 mg by mouth daily.     Yes Historical Provider, MD  calcium carbonate (TUMS - DOSED IN MG ELEMENTAL CALCIUM) 500 MG chewable tablet Chew 1 tablet by mouth daily.   Yes Historical Provider, MD  cinacalcet (SENSIPAR) 30 MG tablet Take 30 mg by mouth daily.   Yes Historical Provider, MD  DULoxetine (CYMBALTA) 60 MG capsule Take 60 mg by mouth at bedtime.    Yes Historical Provider, MD  gabapentin (NEURONTIN) 300 MG capsule Take 300 mg by mouth at bedtime.    Yes Historical Provider, MD  insulin detemir (LEVEMIR) 100 UNIT/ML injection Inject 2-3 Units into the skin at bedtime as needed (for CBG greater than 150). On sliding scale: if over 150, take 2 units; if over 300, take 3 units; etc.   Yes Historical Provider, MD  levothyroxine (SYNTHROID, LEVOTHROID) 150 MCG tablet Take 150 mcg by mouth daily before breakfast.   Yes Historical Provider, MD  LORazepam (ATIVAN) 0.5 MG tablet Take 0.5 mg by mouth at bedtime.  02/08/13  Yes Historical Provider, MD  metoprolol (LOPRESSOR) 50 MG tablet Take 1 tablet (50 mg total) by mouth 2 (two) times daily. 04/15/13  Yes Erlene Quan, PA-C  Multiple Vitamin (MULTIVITAMIN WITH MINERALS) TABS tablet Take 1 tablet by mouth at bedtime.    Yes Historical Provider, MD  promethazine (PHENERGAN) 12.5 MG tablet Take 12.5 mg by mouth every 4 (four) hours as needed for nausea.  02/09/13  Yes Historical Provider, MD  tacrolimus (PROGRAF) 1 MG capsule Take 5 mg by mouth 2 (two) times daily.    Yes Historical Provider, MD  traMADol (ULTRAM) 50 MG tablet Take 50 mg by mouth every 12 (twelve) hours as needed.  02/09/13  Yes Historical Provider, MD  VELPHORO 500 MG chewable tablet Chew 1 tablet by mouth 3 (three) times daily. 06/10/13  Yes Historical Provider, MD  warfarin (COUMADIN) 3 MG tablet Take 3-6 mg by mouth daily. Take 1 tablets (3 mg) on Sundays, Mondays, Wednesdays, and Fridays.  On other days, take 2 tablets (6 mg).   Yes Historical Provider, MD  amLODipine (NORVASC) 5 MG tablet Take 1 tablet daily  on non dialysis days. 06/27/13   Troy Sine, MD    Physical Exam: Filed Vitals:   07/03/13 2027  BP: 127/98  Pulse: 75  Temp: 98.4 F (36.9 C)  TempSrc: Oral  Height: 5\' 4"  (1.626 m)  Weight: 78 kg (171 lb 15.3 oz)  SpO2: 96%    General: Alert, Awake and Oriented to Time, Place and Person. Appear in moderate distress Eyes: PERRL ENT: Oral Mucosa clear moist. Neck: MILD JVD Cardiovascular: S1 and S2 Present, AORTIC SYSTOLIC Murmur, Peripheral Pulses Present Respiratory: Bilateral Air entry equal and Decreased ON BASES, MID LUNG BILATERAL Crackles,NO wheezes Abdomen: Bowel Sound Present, Soft and Non tender Skin: NO Rash Extremities: NO Pedal edema, NO calf tenderness Neurologic: Grossly no focal neuro deficit. Labs on Admission:  CBC:  Recent Labs Lab 07/03/13 2200  WBC 5.1  NEUTROABS 4.5  HGB 11.6*  HCT 35.4*  MCV 94.7  PLT 171    CMP     Component Value Date/Time   NA 136* 04/11/2013 2008   K 4.7 04/11/2013 2008   CL 94*  04/11/2013 2008   CO2 23 04/11/2013 2008   GLUCOSE 200* 04/11/2013 2008   BUN 57*  04/11/2013 2008   CREATININE 10.06* 04/11/2013 2008   CALCIUM 8.1* 04/11/2013 2008   PROT 6.9 04/11/2013 2008   ALBUMIN 2.5* 04/11/2013 2008   AST 20 04/11/2013 2008   ALT 14 04/11/2013 2008   ALKPHOS 95 04/11/2013 2008   BILITOT 0.3 04/11/2013 2008   GFRNONAA 4* 04/11/2013 2008   GFRAA 4* 04/11/2013 2008    No results found for this basename: LIPASE, AMYLASE,  in the last 168 hours No results found for this basename: AMMONIA,  in the last 168 hours  No results found for this basename: CKTOTAL, CKMB, CKMBINDEX, TROPONINI,  in the last 168 hours BNP (last 3 results)  Recent Labs  07/20/12 0154 08/30/12 0758 09/01/12 0555  PROBNP 42149.0* 41152.0* 32077.0*    Radiological Exams on Admission: No results found.  EKG: Independently reviewed. normal sinus rhythm, nonspecific ST and T waves changes.  Assessment/Plan Active Problems:   HEPATITIS C- s/p liver transplant 2011   HYPOTHYROIDISM   DIABETES MELLITUS, TYPE II   HTN- LVH, EF45- 50%. grade 2 diastolic dysf. echo 1/88/41   GERD   End stage renal disease on dialysis-TTS   Aortic stenosis, moderate-severe at cath Jan 2015. s/p Mechanical AVR 03/2013   COPD (chronic obstructive pulmonary disease)   S/P CABG x 1, LIMA-LAD 03/2013   Pleural effusion   1. Pleural effusion The patient is presenting with complaints of cough and shortness of breath. The cough is dry. The shortness of breath is associated with exertion and also at rest. She also has hypoxia and pleuritic chest pain. CT chest is negative for pulmonary embolism or pulmonary edema. She appears to have bilateral moderate pleural effusion. With that her symptoms can be explained easily by pleural effusion or lobe. The pleural effusion can be secondary to postoperative changes, acute infection, or volume overloaded. Cardiology will be consulted for further input related to volume overload. Nephrology will be also be consulted to continue her on hemodialysis. I would add incentive  spirometry and pain management to improve her oxygenation and atelectasis. Due to possibility of her throat associated pneumonia due to her exposure to dialysis, immunosuppression, recent procedure she will be empirically covered with vancomycin and Zosyn and we will obtain sputum culture as well as blood culture.  2. Diabetes mellitus Placed on sliding scale  3. Hypertension Continue amlodipine  4. Aortic wall replacement status post CABG Continue warfarin  Consults: CARDIOLOGY  DVT Prophylaxis: WARFARIN Nutrition: CARDIAC AND RENAL AND NPO AFTER MIDNIGHT  Code Status: FULL  Disposition: Admitted to inpatient in step-down unit.  Author: Berle Mull, MD [facility] Pager: [phone] 07/03/2013, 10:20 PM    If 7PM-7AM, please contact night-coverage [email or web address] Password [email or web address]

## 2013-07-04 ENCOUNTER — Encounter (HOSPITAL_COMMUNITY): Payer: Self-pay | Admitting: *Deleted

## 2013-07-04 DIAGNOSIS — R0602 Shortness of breath: Secondary | ICD-10-CM

## 2013-07-04 DIAGNOSIS — R079 Chest pain, unspecified: Secondary | ICD-10-CM

## 2013-07-04 DIAGNOSIS — J90 Pleural effusion, not elsewhere classified: Principal | ICD-10-CM

## 2013-07-04 DIAGNOSIS — N186 End stage renal disease: Secondary | ICD-10-CM

## 2013-07-04 LAB — COMPREHENSIVE METABOLIC PANEL
ALBUMIN: 2.7 g/dL — AB (ref 3.5–5.2)
ALK PHOS: 70 U/L (ref 39–117)
ALT: 9 U/L (ref 0–35)
AST: 17 U/L (ref 0–37)
BILIRUBIN TOTAL: 0.6 mg/dL (ref 0.3–1.2)
BUN: 30 mg/dL — AB (ref 6–23)
CHLORIDE: 93 meq/L — AB (ref 96–112)
CO2: 23 meq/L (ref 19–32)
Calcium: 8.3 mg/dL — ABNORMAL LOW (ref 8.4–10.5)
Creatinine, Ser: 6.9 mg/dL — ABNORMAL HIGH (ref 0.50–1.10)
GFR calc Af Amer: 7 mL/min — ABNORMAL LOW (ref >90)
GFR, EST NON AFRICAN AMERICAN: 6 mL/min — AB (ref >90)
Glucose, Bld: 277 mg/dL — ABNORMAL HIGH (ref 70–99)
Potassium: 4.4 mEq/L (ref 3.7–5.3)
Sodium: 134 mEq/L — ABNORMAL LOW (ref 137–147)
Total Protein: 6.5 g/dL (ref 6.0–8.3)

## 2013-07-04 LAB — PROTIME-INR
INR: 2.52 — ABNORMAL HIGH (ref 0.00–1.49)
Prothrombin Time: 26.3 seconds — ABNORMAL HIGH (ref 11.6–15.2)

## 2013-07-04 LAB — GLUCOSE, CAPILLARY
GLUCOSE-CAPILLARY: 265 mg/dL — AB (ref 70–99)
GLUCOSE-CAPILLARY: 217 mg/dL — AB (ref 70–99)
GLUCOSE-CAPILLARY: 213 mg/dL — AB (ref 70–99)
Glucose-Capillary: 229 mg/dL — ABNORMAL HIGH (ref 70–99)

## 2013-07-04 LAB — TROPONIN I: Troponin I: <0.3 ng/mL (ref <0.30)

## 2013-07-04 LAB — CBC WITH DIFFERENTIAL/PLATELET
BASOS ABS: 0 10*3/uL (ref 0.0–0.1)
Basophils Relative: 0 % (ref 0–1)
Eosinophils Absolute: 0 10*3/uL (ref 0.0–0.7)
Eosinophils Relative: 0 % (ref 0–5)
HEMATOCRIT: 33.9 % — AB (ref 36.0–46.0)
HEMOGLOBIN: 10.7 g/dL — AB (ref 12.0–15.0)
LYMPHS PCT: 11 % — AB (ref 12–46)
Lymphs Abs: 0.7 10*3/uL (ref 0.7–4.0)
MCH: 30.2 pg (ref 26.0–34.0)
MCHC: 31.6 g/dL (ref 30.0–36.0)
MCV: 95.8 fL (ref 78.0–100.0)
MONOS PCT: 5 % (ref 3–12)
Monocytes Absolute: 0.4 10*3/uL (ref 0.1–1.0)
NEUTROS ABS: 5.4 10*3/uL (ref 1.7–7.7)
NEUTROS PCT: 84 % — AB (ref 43–77)
Platelets: 145 10*3/uL — ABNORMAL LOW (ref 150–400)
RBC: 3.54 MIL/uL — AB (ref 3.87–5.11)
RDW: 15.3 % (ref 11.5–15.5)
WBC: 6.4 10*3/uL (ref 4.0–10.5)

## 2013-07-04 LAB — VANCOMYCIN, RANDOM: Vancomycin Rm: 17.4 ug/mL

## 2013-07-04 MED ORDER — INSULIN DETEMIR 100 UNIT/ML ~~LOC~~ SOLN
5.0000 [IU] | Freq: Every day | SUBCUTANEOUS | Status: DC
  Administered 2013-07-04 – 2013-07-06 (×3): 5 [IU] via SUBCUTANEOUS
  Filled 2013-07-04 (×4): qty 0.05

## 2013-07-04 MED ORDER — WARFARIN SODIUM 2 MG PO TABS
2.0000 mg | ORAL_TABLET | ORAL | Status: DC
  Administered 2013-07-04: 2 mg via ORAL
  Filled 2013-07-04: qty 1

## 2013-07-04 MED ORDER — ALBUTEROL SULFATE (2.5 MG/3ML) 0.083% IN NEBU: 2.5000 mg | INHALATION_SOLUTION | RESPIRATORY_TRACT | Status: DC | PRN

## 2013-07-04 MED ORDER — VANCOMYCIN HCL IN DEXTROSE 750-5 MG/150ML-% IV SOLN: 750.0000 mg | INTRAVENOUS | Status: DC

## 2013-07-04 MED ORDER — WARFARIN SODIUM 2 MG PO TABS
2.0000 mg | ORAL_TABLET | ORAL | Status: DC
  Filled 2013-07-04: qty 1

## 2013-07-04 NOTE — Progress Notes (Signed)
Utilization Review Completed.Angelica Wix T Dowell5/25/2015  

## 2013-07-04 NOTE — Consult Note (Addendum)
Cardiology Consultation Note  Patient ID: [name], MRN: [id number], DOB/AGE: 59-Jul-1957 59 y.o. Admit date: 07/03/2013   Date of Consult: 07/04/2013 Primary Physician: Charlynn Court, NP Primary Cardiologist: Dr [name]   Chief Complaint: chest pain  Reason for Consult: chest pain    Assessment and Plan:  -Chest pain - pleuritic with improvemnt leaning forward?  Pericardial inflammation post bypass -SOB with pleural effusion -? constrictive physiology post bypass . Other possibilities are post op atelectasis vs thyroid dysfunction  -AVR on chronic anticoagulation  -CKD on HD   Plan  -Get Echocardiogram to evaluatethe lv function and pericaridial effusion/constriction physiology. Will decided on further tx based on this -Continue anticoagulation in this pt with mechanical AVR -Continue medical management for secondary prevention with aspirin, b-blocker    58 y/o woman with CAD s/p recent single-vessel CABG (LIMA -> LAD) and mechanical AVR for AS (03/15/13) by Dr. Prescott Gum. She also has ESRD with HD TTS, ESLD s/p liver transplant (2011), DM, HTN, HLD, anxiety, and tobacco use,. She presented 04/09/13 for  chest pain and palpitations and was worked to have paroxysmal SVT and d/c on higher dose B-blocker.  She is now being admitted for evaluation of pleuritic chest pain and SOB   HPI: pt states that for the past 3 day she has had sharp pleuritic chest pain that improves with leaning forward and worse with lying down. Pain make it difficlut for her to take deep breath and she has orthopnea, but no  PND , LE edema , focal weakness, syncope, bleeding diathesis , claudication , palpitation etc .  Reports medication compliance   Past Medical History  Diagnosis Date  . 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 Chronic back pain   . CAD (coronary artery disease)   . Obesity   . Peripheral vascular disease hands and  legs  . Anxiety   . Asthma   . GERD (gastroesophageal reflux disease)     takes Omeprazole daily  . Chronic constipation     takes MIralax and Colace daily  . Anemia     takes Folic Acid daily  . Hypothyroidism     takes Synthroid daily  . Depression     takes Cymbalta for "severe" depression  . Neuromuscular disorder     carpal tunnel in right hand  . Hypertension     takes Metoprolol and Lisinopril daily, sees Dr Bea Graff  . COPD (chronic obstructive pulmonary disease)   . Pneumonia     "today and several times before" (08/30/2012)  . Chronic bronchitis     "q yr w/season changes" (08/30/2012)  . Type II diabetes mellitus     Levemir 2units daily if > 150  . History of blood transfusion     "several" (08/30/2012)  . Hepatitis C   . Migraine     "last migraine was in 2013" (08/30/2012)  . Headache     "at least monthly" (08/30/2012)  . Arthritis     "left hand, back" (08/30/2012)  . End stage renal disease on dialysis 02/27/2011    Kidneys shut down at time of liver transplant in Sept 2011 at [facility] in [place], she has been on HD ever since.  Dialyzes at [facility] HD on TTS schedule.  Had L forearm graft used 10 months then removed Dec 2012 due to suspected infection.  A right upper  arm AVG was placed Dec 2012 but she developed steal symptoms acutely and it was ligated the same day.  Never had an AV fistula due to small veins.  Now has L thigh AVG put in Jan 2013, has not clotted to date.    [name] CAD (coronary artery disease) Jan. 2015    Cath: 20% LAD, 50% D1      Most Recent Cardiac Studies: Echo 04/2013-   Mild basal septal hypertrophy with LVEF approximately 55%, basal inferior hypokinesis, grade 2 diastolic dysfunction. Mild biatrial enlargement. Reported to have 21 mm CarboMedics tophat aortic prosthesis - gradients are in normal range for this type of prosthesis. Cannot exclude small perivalvular leak versus eccentric aortic regurgitation. Unable to assess PASP.     Surgical History:  Past Surgical History  Procedure Laterality Date  . Liver transplant  10/25/2009    sees Dr Ferol Luz 1 every 6 months, saw last in Dec 2013. [name] Coord [phone]  . Small intestine surgery  90's  . Thrombectomy    . Arteriovenous graft placement Left 10/03/10     forearm  . Avgg removal  12/23/2010    Procedure: REMOVAL OF ARTERIOVENOUS GORETEX GRAFT ([place]);  Surgeon: Elam Dutch, MD;  Location: [place];  Service: Vascular;  Laterality: Left;  procedure started @1736 -1852  . Insertion of dialysis catheter  12/23/2010    Procedure: INSERTION OF DIALYSIS CATHETER;  Surgeon: Elam Dutch, MD;  Location: [place];  Service: Vascular;  Laterality: Right;  Right Internal Jugular 28cm dialysis catheter insertion procedure time 1701-1720   . Cholecystectomy  1993  . Cystoscopy  1990's  . Spinal growth rods  2010    "put 2 metal rods in my back; they had detetriorated" (08/30/2012)  . Av fistula placement  01/29/2011    Procedure: INSERTION OF ARTERIOVENOUS (AV) GORE-TEX GRAFT ARM;  Surgeon: Elam Dutch, MD;  Location: [place];  Service: Vascular;  Laterality: Right;  . Av fistula placement  03/10/2011    Procedure: INSERTION OF ARTERIOVENOUS (AV) GORE-TEX GRAFT THIGH;  Surgeon: Elam Dutch, MD;  Location: [place];  Service: Vascular;  Laterality: Left;  . Tubal ligation  1990's  . Cardiac catheterization  2014  . Aortic valve replacement N/A 03/15/2013    AVR; Surgeon: Ivin Poot, MD;  Location: [facility] OR; Open Heart Surgery;  50mmCarboMedics mechanical prosthesis, top hat valve  . Coronary artery bypass graft N/A 03/15/2013    Procedure: CORONARY ARTERY BYPASS GRAFTING (CABG) times one using left internal mammary artery.;  Surgeon: Ivin Poot, MD;  Location: [place];  Service: Open Heart Surgery;  Laterality: N/A;  POSS CABG X 1  . Intraoperative transesophageal echocardiogram N/A 03/15/2013    Procedure: INTRAOPERATIVE TRANSESOPHAGEAL ECHOCARDIOGRAM;   Surgeon: Ivin Poot, MD;  Location: [place];  Service: Open Heart Surgery;  Laterality: N/A;     Home Meds: Prior to Admission medications   Medication Sig Start Date End Date Taking? Authorizing Provider  albuterol (PROVENTIL,VENTOLIN) 90 MCG/ACT inhaler Inhale 1-2 puffs into the lungs every 6 (six) hours as needed for wheezing or shortness of breath.    Yes Historical Provider, MD  aspirin EC 81 MG tablet Take 81 mg by mouth daily.     Yes Historical Provider, MD  calcium carbonate (TUMS - DOSED IN MG ELEMENTAL CALCIUM) 500 MG chewable tablet Chew 1 tablet by mouth daily.   Yes Historical Provider, MD  cinacalcet (SENSIPAR) 30 MG tablet Take 30 mg by mouth daily.  Yes Historical Provider, MD  DULoxetine (CYMBALTA) 60 MG capsule Take 60 mg by mouth at bedtime.    Yes Historical Provider, MD  gabapentin (NEURONTIN) 300 MG capsule Take 300 mg by mouth at bedtime.    Yes Historical Provider, MD  insulin detemir (LEVEMIR) 100 UNIT/ML injection Inject 2-3 Units into the skin at bedtime as needed (for CBG greater than 150). On sliding scale: if over 150, take 2 units; if over 300, take 3 units; etc.   Yes Historical Provider, MD  levothyroxine (SYNTHROID, LEVOTHROID) 150 MCG tablet Take 150 mcg by mouth daily before breakfast.   Yes Historical Provider, MD  LORazepam (ATIVAN) 0.5 MG tablet Take 0.5 mg by mouth at bedtime.  02/08/13  Yes Historical Provider, MD  metoprolol (LOPRESSOR) 50 MG tablet Take 1 tablet (50 mg total) by mouth 2 (two) times daily. 04/15/13  Yes Erlene Quan, PA-C  Multiple Vitamin (MULTIVITAMIN WITH MINERALS) TABS tablet Take 1 tablet by mouth at bedtime.    Yes Historical Provider, MD  promethazine (PHENERGAN) 12.5 MG tablet Take 12.5 mg by mouth every 4 (four) hours as needed for nausea.  02/09/13  Yes Historical Provider, MD  tacrolimus (PROGRAF) 1 MG capsule Take 5 mg by mouth 2 (two) times daily.    Yes Historical Provider, MD  traMADol (ULTRAM) 50 MG tablet Take 50 mg by  mouth every 12 (twelve) hours as needed.  02/09/13  Yes Historical Provider, MD  VELPHORO 500 MG chewable tablet Chew 1 tablet by mouth 3 (three) times daily. 06/10/13  Yes Historical Provider, MD  warfarin (COUMADIN) 3 MG tablet Take 3-6 mg by mouth daily. Take 1 tablets (3 mg) on Sundays, Mondays, Wednesdays, and Fridays.  On other days, take 2 tablets (6 mg).   Yes Historical Provider, MD  amLODipine (NORVASC) 5 MG tablet Take 1 tablet daily  on non dialysis days. 06/27/13   Troy Sine, MD    Inpatient Medications:  . amLODipine  5 mg Oral Daily  . aspirin EC  81 mg Oral Daily  . cinacalcet  30 mg Oral Daily  . DULoxetine  60 mg Oral QHS  . ferrous sulfate  325 mg Oral TID WC  . gabapentin  300 mg Oral QHS  . insulin aspart  0-5 Units Subcutaneous QHS  . insulin aspart  0-9 Units Subcutaneous TID WC  . levothyroxine  150 mcg Oral QAC breakfast  . lidocaine  1 patch Transdermal Q24H  . LORazepam  0.5 mg Oral QHS  . metoprolol  50 mg Oral BID  . piperacillin-tazobactam (ZOSYN)  IV  2.25 g Intravenous 3 times per day  . sodium chloride  3 mL Intravenous Q12H  . tacrolimus  5 mg Oral BID  . warfarin  3 mg Oral Once per day on Sun Mon Wed Fri  . [START ON 07/05/2013] warfarin  6 mg Oral Once per day on Tue Thu Sat  . Warfarin - Pharmacist Dosing Inpatient   Does not apply q1800      Allergies:  Allergies  Allergen Reactions  . Acetaminophen Other (See Comments)    Liver transplant recipient   . Codeine Itching    History   Social History  . Marital Status: Divorced    Spouse Name: N/A    Number of Children: N/A  . Years of Education: N/A   Occupational History  . Not on file.   Social History Main Topics  . Smoking status: Former Smoker -- 0.75 packs/day for 40  years    Types: Cigarettes    Quit date: 03/13/2013  . Smokeless tobacco: Never Used     Comment: still smokng 1 or 2 a day  . Alcohol Use: Yes     Comment: 08/30/2012 "last drink was at a wedding July, 2014;  had a small glass of wine; never had problems w/alcohol"  . Drug Use: No  . Sexual Activity: Not Currently   Other Topics Concern  . Not on file   Social History Narrative  . No narrative on file     Family History  Problem Relation Age of Onset  . Cancer Mother   . Diabetes Mother   . Hypertension Mother   . Stroke Mother   . Cancer Father   . Anesthesia problems Neg Hx   . Hypotension Neg Hx   . Malignant hyperthermia Neg Hx   . Pseudochol deficiency Neg Hx      Review of Systems: General: negative for chills, fever, night sweats or weight changes.  Cardiovascular: per HPI  Dermatological: negative for rash Respiratory: negative for cough or wheezing Urologic: negative for hematuria Abdominal: negative for nausea, vomiting, diarrhea, bright red blood per rectum, melena, or hematemesis Neurologic: negative for visual changes, syncope, or dizziness All other systems reviewed and are otherwise negative except as noted above.  Labs:  Recent Labs  07/03/13 2200  TROPONINI <0.30   Lab Results  Component Value Date   WBC 5.1 07/03/2013   HGB 11.6* 07/03/2013   HCT 35.4* 07/03/2013   MCV 94.7 07/03/2013   PLT 171 07/03/2013    Recent Labs Lab 07/03/13 2200  NA 134*  K 3.9  CL 93*  CO2 24  BUN 25*  CREATININE 6.21*  CALCIUM 8.5  PROT 7.3  BILITOT 0.7  ALKPHOS 80  ALT 11  AST 21  GLUCOSE 338*   Lab Results  Component Value Date   CHOL 225* 12/22/2011   HDL 97 12/22/2011   LDLCALC 104* 12/22/2011   TRIG 121 12/22/2011   No results found for this basename: DDIMER    Radiology/Studies:  Per notes CT Chest at oSH negative  For PE and show pleural effusions  EKG: 07/03/2013 NSR, AS infaract  Voltage criteria for LVH, lateral TWi   Physical Exam: Blood pressure 127/98, pulse 75, temperature 98.3 F (36.8 C), temperature source Oral, height 5\' 4"  (1.626 m), weight 78 kg (171 lb 15.3 oz), SpO2 96.00%. General: Well developed, well nourished, in no acute  distress.  Neck: Negative for carotid bruits. JVD not elevated. Lungs: Clear bilaterally to auscultation without wheezes, rales, or rhonchi. Breathing is unlabored. Heart: RRR with S1 . Mechanical click of S2, 2/6 pansystolic murmur  Abdomen: Soft, non-tender, non-distended with normoactive bowel sounds. No hepatomegaly. No rebound/guarding. No obvious abdominal masses. Extremities: No clubbing or cyanosis. No edema.  Distal pedal pulses are 2+ and equal bilaterally. Neuro: Alert and oriented X 3. No facial asymmetry. No focal deficit. Moves all extremities spontaneously. Psych:  Responds to questions appropriately with a normal affect.    Signed, Grafton Folk M.D  07/04/2013, 1:25 AM

## 2013-07-04 NOTE — Progress Notes (Signed)
Pt arrived to unit via wheelchair and NT. Placed on tele box 6E01, NSR, Natalie CMT notified. No cx at this time. Will continue to monitor.

## 2013-07-04 NOTE — Progress Notes (Signed)
[facility] TEAM 1 - Stepdown/ICU TEAM Progress Note  [name] QVZ:[id number] DOB: [birth year] DOA: 07/03/2013 PCP: Charlynn Court, NP  Admit HPI / Brief Narrative: 58 y.o. female with a history of coronary artery disease, aortic stenosis, status post AoVR and CABG Feb 2015, hypertension, hepatitis C status post liver transplant, ESRD on hemodialysis Tuesday Thursday Saturday, and hypothyroidism who presented with complaints of shortness of breath and cough.  When she was trying to sleep she started having trouble breathing when she was lying down flat. She tried to raise the number of pillows which initially helped but later on she continued to have progressively worsening shortness of breath. She also felt some palpitations at that point. She had bilateral chest pain which was pleuritic in nature stabbing and sharp worsening with deep breath.   She initally reported to [facility] ER where a CT was NEGATIVE for pulmonary embolism. Patient was transferred to [facility] for further evaluation.   HPI/Subjective: C/o feeling very weak, to the point she doesn't feel like she can walk safely on her on.  Denies focal weakness.  Reports continued SOB, but denies cp at the moment.  No nausea or vomiting.    Assessment/Plan:  Chest pain Has been readmitted once before following her CABG w/ similar complaints - care as per Cardiology   Moderate B Pleural effusions Denies missing HD - dry weight may need to be adjusted - tx via HD - Nephrology alerted to pt presence to allow for HD per T,Th,Sat schedule   CAD s/p CABG x1 No sx to suggest USAP - Cardiology following   AoS s/p Mech AoVR TTE pending to assure valve functioning properly   ESRD  Nephrology contacted via VM for non-emergent scheduled HD  Hyperglycemia - DM No listed hx of DM, but pt is on levemir at home  - check A1c - utilize SSI   Code Status: FULL Family Communication: no family present at time of exam Disposition Plan:  transfer to renal floor  Consultants: Cardiolgy Angelena Form)  Procedures: TTE - pending   Antibiotics: none  DVT prophylaxis: warfarin  Objective: Blood pressure 158/51, pulse 62, temperature 97.8 F (36.6 C), temperature source Oral, resp. rate 18, height 5\' 4"  (1.626 m), weight 79.4 kg (175 lb 0.7 oz), SpO2 95.00%.  Intake/Output Summary (Last 24 hours) at 07/04/13 1416 Last data filed at 07/04/13 0900  Gross per 24 hour  Intake     60 ml  Output      0 ml  Net     60 ml   Exam: General: No acute respiratory distress Lungs: muted breath sounds in B bases - no wheeze - no focal crackles  Cardiovascular: Regular rate and rhythm without gallop or rub  Abdomen: Nontender, nondistended, soft, bowel sounds positive, no rebound, no ascites, no appreciable mass Extremities: No significant cyanosis, clubbing;  1+ edema bilateral lower extremities  Data Reviewed: Basic Metabolic Panel:  Recent Labs Lab 07/03/13 2200 07/04/13 0711  NA 134* 134*  K 3.9 4.4  CL 93* 93*  CO2 24 23  GLUCOSE 338* 277*  BUN 25* 30*  CREATININE 6.21* 6.90*  CALCIUM 8.5 8.3*  MG 2.0  --   PHOS 5.9*  --    Liver Function Tests:  Recent Labs Lab 07/03/13 2200 07/04/13 0711  AST 21 17  ALT 11 9  ALKPHOS 80 70  BILITOT 0.7 0.6  PROT 7.3 6.5  ALBUMIN 3.0* 2.7*   CBC:  Recent Labs Lab 07/03/13 2200  07/04/13 0711  WBC 5.1 6.4  NEUTROABS 4.5 5.4  HGB 11.6* 10.7*  HCT 35.4* 33.9*  MCV 94.7 95.8  PLT 171 145*   Cardiac Enzymes:  Recent Labs Lab 07/03/13 2200 07/04/13 0238 07/04/13 0848  TROPONINI <0.30 <0.30 <0.30   BNP (last 3 results)  Recent Labs  07/20/12 0154 08/30/12 0758 09/01/12 0555  PROBNP 42149.0* 41152.0* 32077.0*   CBG:  Recent Labs Lab 07/04/13 0800 07/04/13 1151  GLUCAP 229* 265*    Recent Results (from the past 240 hour(s))  MRSA PCR SCREENING     Status: None   Collection Time    07/03/13  8:21 PM      Result Value Ref Range Status   MRSA  by PCR NEGATIVE  NEGATIVE Final   Comment:            The GeneXpert MRSA Assay (FDA     approved for NASAL specimens     only), is one component of a     comprehensive MRSA colonization     surveillance program. It is not     intended to diagnose MRSA     infection nor to guide or     monitor treatment for     MRSA infections.     Studies:  Recent x-ray studies have been reviewed in detail by the Attending Physician  Scheduled Meds:  Scheduled Meds: . amLODipine  5 mg Oral Daily  . aspirin EC  81 mg Oral Daily  . cinacalcet  30 mg Oral Daily  . DULoxetine  60 mg Oral QHS  . ferrous sulfate  325 mg Oral TID WC  . gabapentin  300 mg Oral QHS  . insulin aspart  0-5 Units Subcutaneous QHS  . insulin aspart  0-9 Units Subcutaneous TID WC  . levothyroxine  150 mcg Oral QAC breakfast  . lidocaine  1 patch Transdermal Q24H  . LORazepam  0.5 mg Oral QHS  . metoprolol  50 mg Oral BID  . piperacillin-tazobactam (ZOSYN)  IV  2.25 g Intravenous 3 times per day  . sodium chloride  3 mL Intravenous Q12H  . tacrolimus  5 mg Oral BID  . [START ON 07/05/2013] vancomycin  750 mg Intravenous Q T,Th,Sa-HD  . warfarin  3 mg Oral Once per day on Sun Mon Wed Fri  . [START ON 07/05/2013] warfarin  6 mg Oral Once per day on Tue Thu Sat  . Warfarin - Pharmacist Dosing Inpatient   Does not apply q1800    Time spent on care of this patient: 35 mins   Cherene Altes , MD   [facility] Office  [phone] Pager - Text Page per Amion as per below:  On-Call/Text Page:      [email or web address]      password [email or web address]  If 7PM-7AM, please contact night-coverage [email or web address] Password [email or web address] 07/04/2013, 2:16 PM   LOS: 1 day
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
## 2013-07-04 NOTE — Discharge Instructions (Signed)
Information on my medicine - Coumadin   (Warfarin)  This medication education was reviewed with me or my healthcare representative as part of my discharge preparation.  The pharmacist that spoke with me during my hospital stay was:  Burna Cash, [facility]  Why was Coumadin prescribed for you? Coumadin was prescribed for you because you have a blood clot or a medical condition that can cause an increased risk of forming blood clots. Blood clots can cause serious health problems by blocking the flow of blood to the heart, lung, or brain. Coumadin can prevent harmful blood clots from forming. As a reminder your indication for Coumadin is:   Blood Clot Prevention After Heart Valve Surgery  What test will check on my response to Coumadin? While on Coumadin (warfarin) you will need to have an INR test regularly to ensure that your dose is keeping you in the desired range. The INR (international normalized ratio) number is calculated from the result of the laboratory test called prothrombin time (PT).  If an INR APPOINTMENT HAS NOT ALREADY BEEN MADE FOR YOU please schedule an appointment to have this lab work done by your health care provider within 7 days. Your INR goal is usually a number between:  2 to 3 or your provider may give you a more narrow range like 2-2.5.  Ask your health care provider during an office visit what your goal INR is.  What  do you need to  know  About  COUMADIN? Take Coumadin (warfarin) exactly as prescribed by your healthcare provider about the same time each day.  DO NOT stop taking without talking to the doctor who prescribed the medication.  Stopping without other blood clot prevention medication to take the place of Coumadin may increase your risk of developing a new clot or stroke.  Get refills before you run out.  What do you do if you miss a dose? If you miss a dose, take it as soon as you remember on the same day then continue your regularly scheduled regimen the next day.   Do not take two doses of Coumadin at the same time.  Important Safety Information A possible side effect of Coumadin (Warfarin) is an increased risk of bleeding. You should call your healthcare provider right away if you experience any of the following:   Bleeding from an injury or your nose that does not stop.   Unusual colored urine (red or dark brown) or unusual colored stools (red or black).   Unusual bruising for unknown reasons.   A serious fall or if you hit your head (even if there is no bleeding).  Some foods or medicines interact with Coumadin (warfarin) and might alter your response to warfarin. To help avoid this:   Eat a balanced diet, maintaining a consistent amount of Vitamin K.   Notify your provider about major diet changes you plan to make.   Avoid alcohol or limit your intake to 1 drink for women and 2 drinks for men per day. (1 drink is 5 oz. wine, 12 oz. beer, or 1.5 oz. liquor.)  Make sure that ANY health care provider who prescribes medication for you knows that you are taking Coumadin (warfarin).  Also make sure the healthcare provider who is monitoring your Coumadin knows when you have started a new medication including herbals and non-prescription products.  Coumadin (Warfarin)  Major Drug Interactions  Increased Warfarin Effect Decreased Warfarin Effect  Alcohol (large quantities) Antibiotics (esp. Septra/Bactrim, Flagyl, Cipro) Amiodarone (Cordarone)  Aspirin (ASA) Cimetidine (Tagamet) Megestrol (Megace) NSAIDs (ibuprofen, naproxen, etc.) Piroxicam (Feldene) Propafenone (Rythmol SR) Propranolol (Inderal) Isoniazid (INH) Posaconazole (Noxafil) Barbiturates (Phenobarbital) Carbamazepine (Tegretol) Chlordiazepoxide (Librium) Cholestyramine (Questran) Griseofulvin Oral Contraceptives Rifampin Sucralfate (Carafate) Vitamin K   Coumadin (Warfarin) Major Herbal Interactions  Increased Warfarin Effect Decreased Warfarin Effect  Garlic Ginseng Ginkgo  biloba Coenzyme Q10 Green tea St. Johns wort    Coumadin (Warfarin) FOOD Interactions  Eat a consistent number of servings per week of foods HIGH in Vitamin K (1 serving =  cup)  Collards (cooked, or boiled & drained) Kale (cooked, or boiled & drained) Mustard greens (cooked, or boiled & drained) Parsley *serving size only =  cup Spinach (cooked, or boiled & drained) Swiss chard (cooked, or boiled & drained) Turnip greens (cooked, or boiled & drained)  Eat a consistent number of servings per week of foods MEDIUM-HIGH in Vitamin K (1 serving = 1 cup)  Asparagus (cooked, or boiled & drained) Broccoli (cooked, boiled & drained, or raw & chopped) Brussel sprouts (cooked, or boiled & drained) *serving size only =  cup Lettuce, raw (green leaf, endive, romaine) Spinach, raw Turnip greens, raw & chopped   These websites have more information on Coumadin (warfarin):  FailFactory.se; VeganReport.com.au;

## 2013-07-04 NOTE — Progress Notes (Signed)
     SUBJECTIVE: chest pain with deep inspiration.   BP 111/55  Pulse 65  Temp(Src) 97.4 F (36.3 C) (Oral)  Ht 5\' 4"  (1.626 m)  Wt 175 lb 0.7 oz (79.4 kg)  BMI 30.03 kg/m2  SpO2 92% No intake or output data in the 24 hours ending 07/04/13 0742  PHYSICAL EXAM General: Well developed, well nourished, in no acute distress. Alert and oriented x 3.  Psych:  Good affect, responds appropriately Neck: No JVD. No masses noted.  Lungs: Clear bilaterally with no wheezes or rhonci noted.  Heart: RRR with no murmurs noted. Abdomen: Bowel sounds are present. Soft, non-tender.  Extremities: No lower extremity edema.   LABS: Basic Metabolic Panel:  Recent Labs  07/03/13 2200  NA 134*  K 3.9  CL 93*  CO2 24  GLUCOSE 338*  BUN 25*  CREATININE 6.21*  CALCIUM 8.5  MG 2.0  PHOS 5.9*   CBC:  Recent Labs  07/03/13 2200 07/04/13 0711  WBC 5.1 6.4  NEUTROABS 4.5 5.4  HGB 11.6* 10.7*  HCT 35.4* 33.9*  MCV 94.7 95.8  PLT 171 145*   Cardiac Enzymes:  Recent Labs  07/03/13 2200 07/04/13 0238  TROPONINI <0.30 <0.30    Current Meds: . amLODipine  5 mg Oral Daily  . aspirin EC  81 mg Oral Daily  . cinacalcet  30 mg Oral Daily  . DULoxetine  60 mg Oral QHS  . ferrous sulfate  325 mg Oral TID WC  . gabapentin  300 mg Oral QHS  . insulin aspart  0-5 Units Subcutaneous QHS  . insulin aspart  0-9 Units Subcutaneous TID WC  . levothyroxine  150 mcg Oral QAC breakfast  . lidocaine  1 patch Transdermal Q24H  . LORazepam  0.5 mg Oral QHS  . metoprolol  50 mg Oral BID  . piperacillin-tazobactam (ZOSYN)  IV  2.25 g Intravenous 3 times per day  . sodium chloride  3 mL Intravenous Q12H  . tacrolimus  5 mg Oral BID  . warfarin  3 mg Oral Once per day on Sun Mon Wed Fri  . [START ON 07/05/2013] warfarin  6 mg Oral Once per day on Tue Thu Sat  . Warfarin - Pharmacist Dosing Inpatient   Does not apply q1800   Problem list:  Pleural effusion Active Problems:   HEPATITIS C- s/p  liver transplant 2011   HYPOTHYROIDISM   DIABETES MELLITUS, TYPE II   HTN- LVH, EF45- 50%. grade 2 diastolic dysf. echo 05/02/53   GERD   End stage renal disease on dialysis-TTS   Aortic stenosis, moderate-severe at cath Jan 2015. 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   1. Chest pain: Cardiac markers are negative. Pt is s/p recent CABG with LIMA to LAD and AVR in February 2015. CTA chest negative for PE. She does have moderate pleural effusions and her pleuritic chest pain is most likely due to this. This is not ACS. Could also be musculoskeletal after CABG. Volume management per dialysis. Agree with echo. We will f/u tomorrow.   2. CAD: s/p CABG  3. Aortic stenosis: S/p AVR  4. ESRD: on HD      Burnell Blanks  5/25/20157:42 AM

## 2013-07-05 DIAGNOSIS — Z7901 Long term (current) use of anticoagulants: Secondary | ICD-10-CM

## 2013-07-05 DIAGNOSIS — Z954 Presence of other heart-valve replacement: Secondary | ICD-10-CM

## 2013-07-05 LAB — BASIC METABOLIC PANEL
BUN: 45 mg/dL — ABNORMAL HIGH (ref 6–23)
CO2: 21 mEq/L (ref 19–32)
Calcium: 8.3 mg/dL — ABNORMAL LOW (ref 8.4–10.5)
Chloride: 90 mEq/L — ABNORMAL LOW (ref 96–112)
Creatinine, Ser: 8.12 mg/dL — ABNORMAL HIGH (ref 0.50–1.10)
GFR calc non Af Amer: 5 mL/min — ABNORMAL LOW (ref >90)
GFR, EST AFRICAN AMERICAN: 6 mL/min — AB (ref >90)
Glucose, Bld: 170 mg/dL — ABNORMAL HIGH (ref 70–99)
Potassium: 4.6 mEq/L (ref 3.7–5.3)
Sodium: 131 mEq/L — ABNORMAL LOW (ref 137–147)

## 2013-07-05 LAB — CBC
HCT: 33.9 % — ABNORMAL LOW (ref 36.0–46.0)
Hemoglobin: 10.9 g/dL — ABNORMAL LOW (ref 12.0–15.0)
MCH: 30.7 pg (ref 26.0–34.0)
MCHC: 32.2 g/dL (ref 30.0–36.0)
MCV: 95.5 fL (ref 78.0–100.0)
Platelets: 172 K/uL (ref 150–400)
RBC: 3.55 MIL/uL — ABNORMAL LOW (ref 3.87–5.11)
RDW: 15.3 % (ref 11.5–15.5)
WBC: 10.3 K/uL (ref 4.0–10.5)

## 2013-07-05 LAB — PROTIME-INR
INR: 3.35 — AB (ref 0.00–1.49)
PROTHROMBIN TIME: 32.7 s — AB (ref 11.6–15.2)

## 2013-07-05 LAB — GLUCOSE, CAPILLARY
GLUCOSE-CAPILLARY: 154 mg/dL — AB (ref 70–99)
GLUCOSE-CAPILLARY: 139 mg/dL — AB (ref 70–99)
Glucose-Capillary: 135 mg/dL — ABNORMAL HIGH (ref 70–99)

## 2013-07-05 LAB — T4, FREE: Free T4: 1.83 ng/dL — ABNORMAL HIGH (ref 0.80–1.80)

## 2013-07-05 MED ORDER — DOXERCALCIFEROL 4 MCG/2ML IV SOLN
2.0000 ug | INTRAVENOUS | Status: DC
  Administered 2013-07-05 – 2013-07-07 (×2): 2 ug via INTRAVENOUS
  Filled 2013-07-05: qty 2

## 2013-07-05 MED ORDER — SODIUM CHLORIDE 0.9 % IV SOLN
62.5000 mg | INTRAVENOUS | Status: DC
  Filled 2013-07-05 (×2): qty 5

## 2013-07-05 MED ORDER — DARBEPOETIN ALFA-POLYSORBATE 25 MCG/0.42ML IJ SOLN
25.0000 ug | INTRAMUSCULAR | Status: DC
  Administered 2013-07-05: 25 ug via INTRAVENOUS
  Filled 2013-07-05: qty 0.42

## 2013-07-05 MED ORDER — DOXERCALCIFEROL 4 MCG/2ML IV SOLN
INTRAVENOUS | Status: AC
  Administered 2013-07-05: 2 ug via INTRAVENOUS
  Filled 2013-07-05: qty 2

## 2013-07-05 MED ORDER — DARBEPOETIN ALFA-POLYSORBATE 25 MCG/0.42ML IJ SOLN
INTRAMUSCULAR | Status: AC
  Administered 2013-07-05: 25 ug via INTRAVENOUS
  Filled 2013-07-05: qty 0.42

## 2013-07-05 NOTE — Clinical Social Work Note (Addendum)
CSW received consult regarding transportation needs. CSW talked with patient and she is requesting help with getting back and forth to dialysis. Patient had been driving herself, however it is getting harder for her, especially after dialysis. Patient given information about [employer] transportation services for [facility] and call made to resource [phone]) and CSW spoke with Ivin Booty in scheduling. She was provided with patients chair time and basic information to set patient up in their system. CSW was informed that patient's cost would be $1.00 each way or $2 per day as she gets the disabled rate. Ivin Booty advised that as soon as we know when patient is ready for discharge, they will be contacted to set-up transportation for next dialysis day. Patient updated and was very appreciative of CSW's assistance.  Abbie Jablon Givens, MSW, LCSW [phone]

## 2013-07-05 NOTE — Consult Note (Signed)
[name] Renal Consultation Note  Indication for Consultation:  Management of ESRD/hemodialysis; anemia, hypertension/volume and secondary hyperparathyroidism  HPI: [name] is a 58 y.o. female with multiple med problems with ESRD TTS ( [facility])  mechanical AVR for AS (03/15/13) . s/p liver transplant (2011), DM, HTN, HLD, anxiety, and continued tobacco use admitted with sob and  admitted for evaluation of pleuritic chest pain and SOB  after HD Saturday.Noted she has been leaving slightly below her edw  The past 3 txs at 72.1, 72.3, 72.3 last 3 txs wit edw 72.5. Admit wt is 79.4 and now 80 kg.  With CXR at [facility] reporting to show pleural effusions and CT scan was neg for PE. Currently denies sob or cp sitting upright in bed at [place].    Past Medical History  Diagnosis Date  . 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 Chronic back pain   . CAD (coronary artery disease)   . Obesity   . Peripheral vascular disease hands and legs  . Anxiety   . Asthma   . GERD (gastroesophageal reflux disease)     takes Omeprazole daily  . Chronic constipation     takes MIralax and Colace daily  . Anemia     takes Folic Acid daily  . Hypothyroidism     takes Synthroid daily  . Depression     takes Cymbalta for "severe" depression  . Neuromuscular disorder     carpal tunnel in right hand  . Hypertension     takes Metoprolol and Lisinopril daily, sees Dr Bea Graff  . COPD (chronic obstructive pulmonary disease)   . Pneumonia     "today and several times before" (08/30/2012)  . Chronic bronchitis     "q yr w/season changes" (08/30/2012)  . Type II diabetes mellitus     Levemir 2units daily if > 150  . History of blood transfusion     "several" (08/30/2012)  . Hepatitis C   . Migraine     "last migraine was in 2013" (08/30/2012)  . Headache     "at least monthly" (08/30/2012)  . Arthritis     "left hand, back"  (08/30/2012)  . End stage renal disease on dialysis 02/27/2011    Kidneys shut down at time of liver transplant in Sept 2011 at [facility] in [place], she has been on HD ever since.  Dialyzes at [facility] HD on TTS schedule.  Had L forearm graft used 10 months then removed Dec 2012 due to suspected infection.  A right upper arm AVG was placed Dec 2012 but she developed steal symptoms acutely and it was ligated the same day.  Never had an AV fistula due to small veins.  Now has L thigh AVG put in Jan 2013, has not clotted to date.    [name] CAD (coronary artery disease) Jan. 2015    Cath: 20% LAD, 50% D1    Past Surgical History  Procedure Laterality Date  . Liver transplant  10/25/2009    sees Dr Ferol Luz 1 every 6 months, saw last in Dec 2013. [name] Coord [phone]  . Small intestine surgery  90's  . Thrombectomy    . Arteriovenous graft placement Left 10/03/10     forearm  . Avgg removal  12/23/2010    Procedure: REMOVAL OF ARTERIOVENOUS GORETEX GRAFT ([place]);  Surgeon: Elam Dutch, MD;  Location: [facility] OR;  Service: Vascular;  Laterality: Left;  procedure started @1736 -1852  . Insertion of dialysis catheter  12/23/2010    Procedure: INSERTION OF DIALYSIS CATHETER;  Surgeon: Elam Dutch, MD;  Location: [place];  Service: Vascular;  Laterality: Right;  Right Internal Jugular 28cm dialysis catheter insertion procedure time 1701-1720   . Cholecystectomy  1993  . Cystoscopy  1990's  . Spinal growth rods  2010    "put 2 metal rods in my back; they had detetriorated" (08/30/2012)  . Av fistula placement  01/29/2011    Procedure: INSERTION OF ARTERIOVENOUS (AV) GORE-TEX GRAFT ARM;  Surgeon: Elam Dutch, MD;  Location: [place];  Service: Vascular;  Laterality: Right;  . Av fistula placement  03/10/2011    Procedure: INSERTION OF ARTERIOVENOUS (AV) GORE-TEX GRAFT THIGH;  Surgeon: Elam Dutch, MD;  Location: [place];  Service: Vascular;  Laterality: Left;  . Tubal ligation  1990's  .  Cardiac catheterization  2014  . Aortic valve replacement N/A 03/15/2013    AVR; Surgeon: Ivin Poot, MD;  Location: [facility] OR; Open Heart Surgery;  31m[employer] mechanical prosthesis, top hat valve  . Coronary artery bypass graft N/A 03/15/2013    Procedure: CORONARY ARTERY BYPASS GRAFTING (CABG) times one using left internal mammary artery.;  Surgeon: PIvin Poot MD;  [place]  Service: Open Heart Surgery;  Laterality: N/A;  POSS CABG X 1  . Intraoperative transesophageal echocardiogram N/A 03/15/2013    Procedure: INTRAOPERATIVE TRANSESOPHAGEAL ECHOCARDIOGRAM;  Surgeon: PIvin Poot MD;  [place]  Service: Open Heart Surgery;  Laterality: N/A;      Family History  Problem Relation Age of Onset  . Cancer Mother   . Diabetes Mother   . Hypertension Mother   . Stroke Mother   . Cancer Father   . Anesthesia problems Neg Hx   . Hypotension Neg Hx   . Malignant hyperthermia Neg Hx   . Pseudochol deficiency Neg Hx       reports that she quit smoking about 3 months ago. Her smoking use included Cigarettes. She has a 30 pack-year smoking history. She has never used smokeless tobacco. She reports that she drinks alcohol. She reports that she does not use illicit drugs.   Allergies  Allergen Reactions  . Acetaminophen Other (See Comments)    Liver transplant recipient   . Codeine Itching    Prior to Admission medications   Medication Sig Start Date End Date Taking? Authorizing Provider  albuterol (PROVENTIL,VENTOLIN) 90 MCG/ACT inhaler Inhale 1-2 puffs into the lungs every 6 (six) hours as needed for wheezing or shortness of breath.    Yes Historical Provider, MD  amLODipine (NORVASC) 5 MG tablet Take 5 mg by mouth daily.   Yes Historical Provider, MD  aspirin EC 81 MG tablet Take 81 mg by mouth daily.     Yes Historical Provider, MD  calcium carbonate (TUMS - DOSED IN MG ELEMENTAL CALCIUM) 500 MG chewable tablet Chew 1 tablet by mouth daily.   Yes Historical  Provider, MD  cinacalcet (SENSIPAR) 30 MG tablet Take 30 mg by mouth daily.   Yes Historical Provider, MD  DULoxetine (CYMBALTA) 60 MG capsule Take 60 mg by mouth at bedtime.    Yes Historical Provider, MD  gabapentin (NEURONTIN) 300 MG capsule Take 300 mg by mouth at bedtime.    Yes Historical Provider, MD  insulin detemir (LEVEMIR) 100 UNIT/ML injection Inject 2-3 Units into the skin at bedtime  as needed (for CBG greater than 150). On sliding scale: if over 150, take 2 units; if over 300, take 3 units; etc.   Yes Historical Provider, MD  levothyroxine (SYNTHROID, LEVOTHROID) 150 MCG tablet Take 150 mcg by mouth daily before breakfast.   Yes Historical Provider, MD  LORazepam (ATIVAN) 0.5 MG tablet Take 0.5 mg by mouth at bedtime as needed for sleep.  02/08/13  Yes Historical Provider, MD  metoprolol (LOPRESSOR) 50 MG tablet Take 1 tablet (50 mg total) by mouth 2 (two) times daily. 04/15/13  Yes Erlene Quan, PA-C  Multiple Vitamin (MULTIVITAMIN WITH MINERALS) TABS tablet Take 1 tablet by mouth at bedtime.    Yes Historical Provider, MD  promethazine (PHENERGAN) 12.5 MG tablet Take 12.5 mg by mouth every 4 (four) hours as needed for nausea.  02/09/13  Yes Historical Provider, MD  tacrolimus (PROGRAF) 1 MG capsule Take 5 mg by mouth 2 (two) times daily.    Yes Historical Provider, MD  traMADol (ULTRAM) 50 MG tablet Take 50 mg by mouth every 12 (twelve) hours as needed.  02/09/13  Yes Historical Provider, MD  VELPHORO 500 MG chewable tablet Chew 1 tablet by mouth 3 (three) times daily with meals.  06/10/13  Yes Historical Provider, MD  warfarin (COUMADIN) 2 MG tablet Take 2 mg by mouth daily at 6 PM. 42m on Sunday, Tuesday and Thursday. 2 mg all other days.   Yes Historical Provider, MD    PKCM:[id number] ondansetron (ZOFRAN) IV, ondansetron, promethazine, traMADol, zolpidem  Results for orders placed during the hospital encounter of 07/03/13 (from the past 48 hour(s))  MRSA PCR SCREENING     Status:  None   Collection Time    07/03/13  8:21 PM      Result Value Ref Range   MRSA by PCR NEGATIVE  NEGATIVE   Comment:            The GeneXpert MRSA Assay (FDA     approved for NASAL specimens     only), is one component of a     comprehensive MRSA colonization     surveillance program. It is not     intended to diagnose MRSA     infection nor to guide or     monitor treatment for     MRSA infections.  CBC WITH DIFFERENTIAL     Status: Abnormal   Collection Time    07/03/13 10:00 PM      Result Value Ref Range   WBC 5.1  4.0 - 10.5 K/uL   RBC 3.74 (*) 3.87 - 5.11 MIL/uL   Hemoglobin 11.6 (*) 12.0 - 15.0 g/dL   HCT 35.4 (*) 36.0 - 46.0 %   MCV 94.7  78.0 - 100.0 fL   MCH 31.0  26.0 - 34.0 pg   MCHC 32.8  30.0 - 36.0 g/dL   RDW 15.2  11.5 - 15.5 %   Platelets 171  150 - 400 K/uL   Neutrophils Relative % 88 (*) 43 - 77 %   Neutro Abs 4.5  1.7 - 7.7 K/uL   Lymphocytes Relative 10 (*) 12 - 46 %   Lymphs Abs 0.5 (*) 0.7 - 4.0 K/uL   Monocytes Relative 2 (*) 3 - 12 %   Monocytes Absolute 0.1  0.1 - 1.0 K/uL   Eosinophils Relative 0  0 - 5 %   Eosinophils Absolute 0.0  0.0 - 0.7 K/uL   Basophils Relative 0  0 - 1 %  Basophils Absolute 0.0  0.0 - 0.1 K/uL  COMPREHENSIVE METABOLIC PANEL     Status: Abnormal   Collection Time    07/03/13 10:00 PM      Result Value Ref Range   Sodium 134 (*) 137 - 147 mEq/L   Potassium 3.9  3.7 - 5.3 mEq/L   Chloride 93 (*) 96 - 112 mEq/L   CO2 24  19 - 32 mEq/L   Glucose, Bld 338 (*) 70 - 99 mg/dL   BUN 25 (*) 6 - 23 mg/dL   Creatinine, Ser 6.21 (*) 0.50 - 1.10 mg/dL   Calcium 8.5  8.4 - 10.5 mg/dL   Total Protein 7.3  6.0 - 8.3 g/dL   Albumin 3.0 (*) 3.5 - 5.2 g/dL   AST 21  0 - 37 U/L   ALT 11  0 - 35 U/L   Alkaline Phosphatase 80  39 - 117 U/L   Total Bilirubin 0.7  0.3 - 1.2 mg/dL   GFR calc non Af Amer 7 (*) >90 mL/min   GFR calc Af Amer 8 (*) >90 mL/min   Comment: (NOTE)     The eGFR has been calculated using the CKD EPI equation.      This calculation has not been validated in all clinical situations.     eGFR's persistently <90 mL/min signify possible Chronic Kidney     Disease.  PROTIME-INR     Status: Abnormal   Collection Time    07/03/13 10:00 PM      Result Value Ref Range   Prothrombin Time 23.8 (*) 11.6 - 15.2 seconds   INR 2.21 (*) 0.00 - 1.49  TROPONIN I     Status: None   Collection Time    07/03/13 10:00 PM      Result Value Ref Range   Troponin I <0.30  <0.30 ng/mL   Comment:            Due to the release kinetics of cTnI,     a negative result within the first hours     of the onset of symptoms does not rule out     myocardial infarction with certainty.     If myocardial infarction is still suspected,     repeat the test at appropriate intervals.  APTT     Status: Abnormal   Collection Time    07/03/13 10:00 PM      Result Value Ref Range   aPTT 47 (*) 24 - 37 seconds   Comment:            IF BASELINE aPTT IS ELEVATED,     SUGGEST PATIENT RISK ASSESSMENT     BE USED TO DETERMINE APPROPRIATE     ANTICOAGULANT THERAPY.  TSH     Status: Abnormal   Collection Time    07/03/13 10:00 PM      Result Value Ref Range   TSH 0.086 (*) 0.350 - 4.500 uIU/mL   Comment: Please note change in reference range.  PHOSPHORUS     Status: Abnormal   Collection Time    07/03/13 10:00 PM      Result Value Ref Range   Phosphorus 5.9 (*) 2.3 - 4.6 mg/dL  MAGNESIUM     Status: None   Collection Time    07/03/13 10:00 PM      Result Value Ref Range   Magnesium 2.0  1.5 - 2.5 mg/dL  CULTURE, BLOOD (ROUTINE X 2)     Status: None  Collection Time    07/04/13 12:20 AM      Result Value Ref Range   Specimen Description BLOOD LEFT HAND     Special Requests BOTTLES DRAWN AEROBIC ONLY 1CC     Culture  Setup Time       Value: 07/04/2013 09:10     Performed at [facility]   Culture       Value:        BLOOD CULTURE RECEIVED NO GROWTH TO DATE CULTURE WILL BE HELD FOR 5 DAYS BEFORE ISSUING A FINAL  NEGATIVE REPORT     Performed at [facility]   Report Status PENDING    CULTURE, BLOOD (ROUTINE X 2)     Status: None   Collection Time    07/04/13 12:24 AM      Result Value Ref Range   Specimen Description BLOOD RIGHT HAND     Special Requests BOTTLES DRAWN AEROBIC ONLY 1CC     Culture  Setup Time       Value: 07/04/2013 09:09     Performed at [facility]   Culture       Value:        BLOOD CULTURE RECEIVED NO GROWTH TO DATE CULTURE WILL BE HELD FOR 5 DAYS BEFORE ISSUING A FINAL NEGATIVE REPORT     Performed at [facility]   Report Status PENDING    TROPONIN I     Status: None   Collection Time    07/04/13  2:38 AM      Result Value Ref Range   Troponin I <0.30  <0.30 ng/mL   Comment:            Due to the release kinetics of cTnI,     a negative result within the first hours     of the onset of symptoms does not rule out     myocardial infarction with certainty.     If myocardial infarction is still suspected,     repeat the test at appropriate intervals.  CBC WITH DIFFERENTIAL     Status: Abnormal   Collection Time    07/04/13  7:11 AM      Result Value Ref Range   WBC 6.4  4.0 - 10.5 K/uL   RBC 3.54 (*) 3.87 - 5.11 MIL/uL   Hemoglobin 10.7 (*) 12.0 - 15.0 g/dL   HCT 33.9 (*) 36.0 - 46.0 %   MCV 95.8  78.0 - 100.0 fL   MCH 30.2  26.0 - 34.0 pg   MCHC 31.6  30.0 - 36.0 g/dL   RDW 15.3  11.5 - 15.5 %   Platelets 145 (*) 150 - 400 K/uL   Neutrophils Relative % 84 (*) 43 - 77 %   Neutro Abs 5.4  1.7 - 7.7 K/uL   Lymphocytes Relative 11 (*) 12 - 46 %   Lymphs Abs 0.7  0.7 - 4.0 K/uL   Monocytes Relative 5  3 - 12 %   Monocytes Absolute 0.4  0.1 - 1.0 K/uL   Eosinophils Relative 0  0 - 5 %   Eosinophils Absolute 0.0  0.0 - 0.7 K/uL   Basophils Relative 0  0 - 1 %   Basophils Absolute 0.0  0.0 - 0.1 K/uL  COMPREHENSIVE METABOLIC PANEL     Status: Abnormal   Collection Time    07/04/13  7:11 AM      Result Value Ref Range   Sodium 134 (*)  137 - 147 mEq/L   Potassium 4.4  3.7 - 5.3 mEq/L   Chloride 93 (*) 96 - 112 mEq/L   CO2 23  19 - 32 mEq/L   Glucose, Bld 277 (*) 70 - 99 mg/dL   BUN 30 (*) 6 - 23 mg/dL   Creatinine, Ser 6.90 (*) 0.50 - 1.10 mg/dL   Calcium 8.3 (*) 8.4 - 10.5 mg/dL   Total Protein 6.5  6.0 - 8.3 g/dL   Albumin 2.7 (*) 3.5 - 5.2 g/dL   AST 17  0 - 37 U/L   ALT 9  0 - 35 U/L   Alkaline Phosphatase 70  39 - 117 U/L   Total Bilirubin 0.6  0.3 - 1.2 mg/dL   GFR calc non Af Amer 6 (*) >90 mL/min   GFR calc Af Amer 7 (*) >90 mL/min   Comment: (NOTE)     The eGFR has been calculated using the CKD EPI equation.     This calculation has not been validated in all clinical situations.     eGFR's persistently <90 mL/min signify possible Chronic Kidney     Disease.  PROTIME-INR     Status: Abnormal   Collection Time    07/04/13  7:11 AM      Result Value Ref Range   Prothrombin Time 26.3 (*) 11.6 - 15.2 seconds   INR 2.52 (*) 0.00 - 1.49  GLUCOSE, CAPILLARY     Status: Abnormal   Collection Time    07/04/13  8:00 AM      Result Value Ref Range   Glucose-Capillary 229 (*) 70 - 99 mg/dL  TROPONIN I     Status: None   Collection Time    07/04/13  8:48 AM      Result Value Ref Range   Troponin I <0.30  <0.30 ng/mL   Comment:            Due to the release kinetics of cTnI,     a negative result within the first hours     of the onset of symptoms does not rule out     myocardial infarction with certainty.     If myocardial infarction is still suspected,     repeat the test at appropriate intervals.  GLUCOSE, CAPILLARY     Status: Abnormal   Collection Time    07/04/13 11:51 AM      Result Value Ref Range   Glucose-Capillary 265 (*) 70 - 99 mg/dL  VANCOMYCIN, RANDOM     Status: None   Collection Time    07/04/13 12:14 PM      Result Value Ref Range   Vancomycin Rm 17.4     Comment:            Random Vancomycin therapeutic     range is dependent on dosage and     time of specimen collection.     A  peak range is 20.0-40.0 ug/mL     A trough range is 5.0-15.0 ug/mL             GLUCOSE, CAPILLARY     Status: Abnormal   Collection Time    07/04/13  4:43 PM      Result Value Ref Range   Glucose-Capillary 217 (*) 70 - 99 mg/dL  GLUCOSE, CAPILLARY     Status: Abnormal   Collection Time    07/04/13  9:07 PM      Result Value Ref Range   Glucose-Capillary 213 (*)  70 - 99 mg/dL  BASIC METABOLIC PANEL     Status: Abnormal   Collection Time    07/05/13  5:35 AM      Result Value Ref Range   Sodium 131 (*) 137 - 147 mEq/L   Potassium 4.6  3.7 - 5.3 mEq/L   Chloride 90 (*) 96 - 112 mEq/L   CO2 21  19 - 32 mEq/L   Glucose, Bld 170 (*) 70 - 99 mg/dL   BUN 45 (*) 6 - 23 mg/dL   Creatinine, Ser 8.12 (*) 0.50 - 1.10 mg/dL   Calcium 8.3 (*) 8.4 - 10.5 mg/dL   GFR calc non Af Amer 5 (*) >90 mL/min   GFR calc Af Amer 6 (*) >90 mL/min   Comment: (NOTE)     The eGFR has been calculated using the CKD EPI equation.     This calculation has not been validated in all clinical situations.     eGFR's persistently <90 mL/min signify possible Chronic Kidney     Disease.  CBC     Status: Abnormal   Collection Time    07/05/13  5:35 AM      Result Value Ref Range   WBC 10.3  4.0 - 10.5 K/uL   RBC 3.55 (*) 3.87 - 5.11 MIL/uL   Hemoglobin 10.9 (*) 12.0 - 15.0 g/dL   HCT 33.9 (*) 36.0 - 46.0 %   MCV 95.5  78.0 - 100.0 fL   MCH 30.7  26.0 - 34.0 pg   MCHC 32.2  30.0 - 36.0 g/dL   RDW 15.3  11.5 - 15.5 %   Platelets 172  150 - 400 K/uL  T4, FREE     Status: Abnormal   Collection Time    07/05/13  5:35 AM      Result Value Ref Range   Free T4 1.83 (*) 0.80 - 1.80 ng/dL   Comment: Performed at [facility], CAPILLARY     Status: Abnormal   Collection Time    07/05/13  7:38 AM      Result Value Ref Range   Glucose-Capillary 154 (*) 70 - 99 mg/dL  GLUCOSE, CAPILLARY     Status: Abnormal   Collection Time    07/05/13 11:49 AM      Result Value Ref Range   Glucose-Capillary 139  (*) 70 - 99 mg/dL    ROS: On ly pos as in HPI  Physical Exam: Filed Vitals:   07/05/13 0952  BP: 148/41  Pulse: 64  Temp: 97.6 F (36.4 C)  Resp: 16     General: alert BF , NAD. Pleasant , talkative and Appropriate  HEENT: [place] MMM Eyes: nonicteric Neck: No jvd, supple Heart: RRR, 2/6 sem lsb, no rub or gallop Lungs: Decr BS  bilat with faint r rales Abdomen: bs pos. Soft, NT< ND Extremities: no pedal edema  Skin: warm dry , no pedal ulcer or overt rash Neuro: OX3, no focal deficits Dialysis Access: Pos. Bruit L fem Avgg  Dialysis Orders: Center: [facility]   on TTS . EDW 72.5 HD [place] 2.0k. 2.0 ca  Time 4 Heparin no . Access L fem avgg BFR 400 DFR 800    Hect. 2 mcg IV/HD Aranesp 44mg weekly   Units IV/HD  Venofer  510mwk   Assessment/Plan 1. Chest pain/sob withVolume overload and Pleural Effusion=  [facility] Card/ admit team and Hd today on schedule for volume removal /needing lower edw at dc /  Card seeing  2. ESRD -  HD today TTS on schedule 3. Hypertension/volume  - stable bp now  Needs volume off with hd  4. Anemia  - ESA . Weekly venofer  5. Metabolic bone disease -  hectoral on hd ,Sensipar, binder with meals monitor CA, phos 6. AVR/CAD/CABG-  On coumadin/ card seeing 7. Liver transplant- Meds  Ernest Haber, PA-C [place] [phone] 07/05/2013, 12:03 PM

## 2013-07-05 NOTE — Progress Notes (Signed)
ANTICOAGULATION CONSULT NOTE - Follow Up Consult  Pharmacy Consult  :  Coumadin Indication  :  Mechanical AVR  Weight: 80 kg   Recent Labs  07/03/13 2200 07/04/13 0711 07/05/13 0535 07/05/13 1255  HGB 11.6* 10.7* 10.9*  --   HCT 35.4* 33.9* 33.9*  --   PLT 171 145* 172  --   APTT 47*  --   --   --   LABPROT 23.8* 26.3*  --  32.7*  INR 2.21* 2.52*  --  3.35*  CREATININE 6.21* 6.90* 8.12*  --      Medications: Scheduled:  . amLODipine  5 mg Oral Daily  . aspirin EC  81 mg Oral Daily  . cinacalcet  30 mg Oral Daily  . darbepoetin (ARANESP) injection - DIALYSIS  25 mcg Intravenous Q Tue-HD  . [START ON 07/07/2013] doxercalciferol  2 mcg Intravenous Q T,Th,Sa-HD  . DULoxetine  60 mg Oral QHS  . [START ON 07/07/2013] ferric gluconate (FERRLECIT/NULECIT) IV  62.5 mg Intravenous Q Thu-HD  . gabapentin  300 mg Oral QHS  . insulin aspart  0-5 Units Subcutaneous QHS  . insulin aspart  0-9 Units Subcutaneous TID WC  . insulin detemir  5 Units Subcutaneous QHS  . levothyroxine  150 mcg Oral QAC breakfast  . lidocaine  1 patch Transdermal Q24H  . LORazepam  0.5 mg Oral QHS  . metoprolol  50 mg Oral BID  . sodium chloride  3 mL Intravenous Q12H  . tacrolimus  5 mg Oral BID  . warfarin  2 mg Oral Once per day on Sun Mon Wed Fri  . warfarin  6 mg Oral Once per day on Tue Thu Sat  . Warfarin - Pharmacist Dosing Inpatient   Does not apply q1800    Infusions:   None  Assessment:  58 y/o Female on chronic Coumadin for a mechanical AVR.  Stated INR goal; 2.5 - 3.    Home Dose of Coumadin is 2 mg MonWedFriSat, 4 mg TuThSun  INR today 2.52 > 3.35.  No evidence of bleeding complications noted   Goal:  INR 2.5 - 3   Plan: 1. No Coumadin today. 2. Daily INR's, Platelet count, CBC.  Monitor for bleeding complications.    [place] Hester, Craig Guess,  Pharm.D  07/05/2013, 2:23 PM

## 2013-07-05 NOTE — Progress Notes (Signed)
Physical Therapy Treatment Patient Details Name: [name] MRN: [id number] DOB: 1956-01-16 Today's Date: 07/05/2013    History of Present Illness pt states wants to return home and get some assistance with a ride home from dialysis    PT Comments    Pt has not been to HD today, but was seen for gait training.  She contiues to have slight unsteadiness, especially with head turns and states she has a walker and cane she sometimes uses at home.  Case management is working on transportation  For HD and patient wants to return home.  She is agreeable to HHPT for continued strengthening.  Follow Up Recommendations        Equipment Recommendations  None recommended by PT    Recommendations for Other Services       Precautions / Restrictions Restrictions Weight Bearing Restrictions: No    Mobility  Bed Mobility Overal bed mobility: Independent             General bed mobility comments: HOB elevated, incr effort and time  Transfers Overall transfer level: Modified independent Equipment used: None Transfers: Sit to/from Stand Sit to Stand: Supervision         General transfer comment: extra time appears unsteady, but did not lose balance  Ambulation/Gait Ambulation/Gait assistance: Supervision Ambulation Distance (Feet): 400 Feet (x2 with rest in between) Assistive device: None Gait Pattern/deviations: Step-through pattern;Drifts right/left   Gait velocity interpretation: >2.62 ft/sec, indicative of independent community ambulator General Gait Details: slightly unsteady however with no overt loss of balance; + drifting, especially with head turns   Stairs Stairs: Yes Stairs assistance: Supervision Stair Management: One rail Right;Alternating pattern Number of Stairs: 2    Wheelchair Mobility    Modified Rankin (Stroke Patients Only)       Balance Overall balance assessment: Modified Independent Sitting-balance support: Feet supported Sitting  balance-Leahy Scale: Good                              Cognition Arousal/Alertness: Lethargic Behavior During Therapy: WFL for tasks assessed/performed Overall Cognitive Status: Within Functional Limits for tasks assessed                      Exercises      General Comments General comments (skin integrity, edema, etc.): pt a little unsteady and reaches for railing  on two occasions during walk      Pertinent Vitals/Pain No c/o pain    Home Living                      Prior Function            PT Goals (current goals can now be found in the care plan section) Progress towards PT goals: Progressing toward goals    Frequency       PT Plan Current plan remains appropriate    Co-evaluation             End of Session   Activity Tolerance: Patient tolerated treatment well Patient left: in chair;with call bell/phone within reach     Time: 1205-1233 PT Time Calculation (min): 28 min  Charges:  $Gait Training: 23-37 mins                    G Codes:     Teresa K. [place], [place] 07/05/2013, 12:34 PM

## 2013-07-05 NOTE — Progress Notes (Signed)
Patient Name: [name] Date of Encounter: 07/05/2013     Principal Problem:   Pleural effusion Active Problems:   HEPATITIS C- s/p liver transplant 2011   HYPOTHYROIDISM   DIABETES MELLITUS, TYPE II   HTN- LVH, EF45- 50%. grade 2 diastolic dysf. echo 2/87/86   GERD   End stage renal disease on dialysis-TTS   Aortic stenosis, moderate-severe at cath Jan 2015. 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 Dialysis for today. Chest pain only when lying flat. Sleepy hard to get to answer questions.   CURRENT MEDS . amLODipine  5 mg Oral Daily  . aspirin EC  81 mg Oral Daily  . cinacalcet  30 mg Oral Daily  . DULoxetine  60 mg Oral QHS  . ferrous sulfate  325 mg Oral TID WC  . gabapentin  300 mg Oral QHS  . insulin aspart  0-5 Units Subcutaneous QHS  . insulin aspart  0-9 Units Subcutaneous TID WC  . insulin detemir  5 Units Subcutaneous QHS  . levothyroxine  150 mcg Oral QAC breakfast  . lidocaine  1 patch Transdermal Q24H  . LORazepam  0.5 mg Oral QHS  . metoprolol  50 mg Oral BID  . sodium chloride  3 mL Intravenous Q12H  . tacrolimus  5 mg Oral BID  . warfarin  2 mg Oral Once per day on Sun Mon Wed Fri  . warfarin  6 mg Oral Once per day on Tue Thu Sat  . Warfarin - Pharmacist Dosing Inpatient   Does not apply q1800    OBJECTIVE  Filed Vitals:   07/04/13 2025 07/05/13 0435 07/05/13 0635 07/05/13 0952  BP: 131/53 145/46  148/41  Pulse: 62 61  64  Temp: 97.5 F (36.4 C) 97.6 F (36.4 C)  97.6 F (36.4 C)  TempSrc: Oral Oral  Oral  Resp: 20 16  16   Height:      Weight:      SpO2: 100% 96% 97% 100%    Intake/Output Summary (Last 24 hours) at 07/05/13 1006 Last data filed at 07/05/13 0736  Gross per 24 hour  Intake    490 ml  Output      0 ml  Net    490 ml   Filed Weights   07/03/13 2027 07/04/13 0500 07/04/13 1745  Weight: 171 lb 15.3 oz (78 kg) 175  lb 0.7 oz (79.4 kg) 177 lb 0.5 oz (80.3 kg)    PHYSICAL EXAM  General: Pleasant, NAD. Sleepy.  hard to focus Neuro: Alert and oriented X 3. Moves all extremities spontaneously. Psych: Normal affect. HEENT:  Normal  Neck: Supple without bruits or JVD. Lungs:  Resp regular and unlabored, CTA. Crackles at bases R>L Heart: RRR no s3, s4, or +,murmur. Abdomen: Soft, non-tender, non-distended, BS + x 4.  Extremities: No clubbing, cyanosis or edema. DP/PT/Radials 2+ and equal bilaterally.  Accessory Clinical Findings  CBC  Recent Labs  07/03/13 2200 07/04/13 0711 07/05/13 0535  WBC 5.1 6.4 10.3  NEUTROABS 4.5 5.4  --   HGB 11.6* 10.7* 10.9*  HCT 35.4* 33.9* 33.9*  MCV 94.7 95.8 95.5  PLT 171 145* 767   Basic Metabolic Panel  Recent Labs  07/03/13 2200 07/04/13 0711 07/05/13 0535  NA 134* 134* 131*  K 3.9 4.4 4.6  CL 93* 93* 90*  CO2 24 23 21  GLUCOSE 338* 277* 170*  BUN 25* 30* 45*  CREATININE 6.21* 6.90* 8.12*  CALCIUM 8.5 8.3* 8.3*  MG 2.0  --   --   PHOS 5.9*  --   --    Liver Function Tests  Recent Labs  07/03/13 2200 07/04/13 0711  AST 21 17  ALT 11 9  ALKPHOS 80 70  BILITOT 0.7 0.6  PROT 7.3 6.5  ALBUMIN 3.0* 2.7*    Cardiac Enzymes  Recent Labs  07/03/13 2200 07/04/13 0238 07/04/13 0848  TROPONINI <0.30 <0.30 <0.30    Recent Labs  07/03/13 2200  TSH 0.086*    TELE  NSR  Radiology/Studies  2D ECHO 04/10/2013 LV EF: 55% ----------------------------------------------------------- Study Conclusions - Left ventricle: The cavity size was normal. There was mild focal basal hypertrophy of the septum. Systolic function was normal. The estimated ejection fraction was 55%. There is hypokinesis of the basalinferior myocardium. Features are consistent with a pseudonormal left ventricular filling pattern, with concomitant abnormal relaxation and increased filling pressure (grade 2 diastolic dysfunction). - Aortic valve: A  7mCarboMedics mechanical prosthesis was present. Gradients are in normal range for this type of prosthesis. Cannot exclude small perivalvular leak versus eccentric aortic regurgitation. Mean gradient: 16mHg (S). Peak gradient: 3347mg (S). - Mitral valve: Calcified annulus. Trivial regurgitation. - Left atrium: The atrium was mildly dilated. - Right ventricle: Systolic function was mildly reduced. - Right atrium: The atrium was mildly dilated. Central venous pressure: 3mm70m (est). - Pulmonary arteries: Systolic pressure could not be accurately estimated. - Pericardium, extracardiac: There was no pericardial effusion. Impressions: - Mild basal septal hypertrophy with LVEF approximately 55%, basal inferior hypokinesis, grade 2 diastolic dysfunction. Mild biatrial enlargement. Reported to have 21 mm CarboMedics tophat aortic prosthesis - gradients are in normal range for this type of prosthesis. Cannot exclude small perivalvular leak versus eccentric aortic regurgitation. Unable to assess PASP.   ASSESSMENT AND PLAN [name] 57 y41. female with a hx of CAD s/p CABG, AS s/p AVR, hypertension, hepatitis C s/p liver transplant, ESRD on hemodialysis (T,T, Sat) and hypothyroidism who [facility] with chest pain.   Chest pain: Cardiac markers are negative and ECG non acute. Chest pain thought to be pleuritic.  -- Pt is s/p recent CABG with LIMA to LAD and AVR in February 2015. CP could be musculoskeletal or inflammatory after CABG. -- CTA chest negative for PE. ( [facility] she did have moderate pleural effusions which could be a cause of her CP. -- 2D ECHO yesterday with EF 55%, mild basal septal hypertrophy, basal inferior hypokinesis, grade 2 diastolic dysfunction, mild biatrial enlargement. Unable to assess PASP.  CAD: s/p CABG   Aortic stenosis: S/p AVR. Continue coumadin. Valve function normal per  TTE. -- 2D ECHO reported to have 21 mm CarboMedics tophat aortic prosthesis - gradients are in normal range for this type of prosthesis. Cannot exclude small perivalvular leak versus eccentric aortic regurgitation.   ESRD: on HD  -- Volume management per dialysis.   Thyroid dysfunction - TSH low (0.086). Consider follow up testing per IM  Signed, KathPerry MountC  Pager [phone] with above assessment and plan as amended.  She is now more alert than earlier today.  I met her walking at the far end of the hall.  She denies chest pain or increased dyspnea at this time.  She is awaiting dialysis later today.  Examination reveals  minimal inspiratory rales at bases.  There is a soft systolic ejection murmur across the prosthetic aortic valve. Plan: No further cardiac tests anticipated during his hospital stay.  Anticipate home soon.

## 2013-07-05 NOTE — Evaluation (Signed)
Occupational Therapy Evaluation Patient Details Name: [name] MRN: [id number] DOB: 1955-08-05 Today's Date: 07/05/2013    History of Present Illness 58 yo female admitted with CP and SOB when lying down. + BIl pleural effusions  HX of ESRD on HD, CAD, AVR/ CABG s/p Liver transplant due to hep C   Clinical Impression   Patient evaluated by Occupational Therapy with no further acute OT needs identified. All education has been completed and the patient has no further questions. See below for any follow-up Occupational Therapy or equipment needs. OT to sign off. Thank you for referral.      Follow Up Recommendations  No OT follow up    Equipment Recommendations  None recommended by OT    Recommendations for Other Services       Precautions / Restrictions Precautions Precautions: None Restrictions Weight Bearing Restrictions: No      Mobility Bed Mobility Overal bed mobility: Independent             General bed mobility comments: in chair on arrival  Transfers Overall transfer level: Modified independent Equipment used: None Transfers: Sit to/from Stand Sit to Stand: Supervision         General transfer comment: extra time appears unsteady, but did not lose balance    Balance Overall balance assessment: Modified Independent Sitting-balance support: Feet supported Sitting balance-Leahy Scale: Good                           Standardized Balance Assessment Standardized Balance Assessment : Dynamic Gait Index   Dynamic Gait Index Level Surface: Normal Change in Gait Speed: Normal Gait with Horizontal Head Turns: Moderate Impairment Gait with Vertical Head Turns: Mild Impairment Gait and Pivot Turn: Normal Step Over Obstacle: Normal Step Around Obstacles: Normal Steps: Normal Total Score: 21      ADL Overall ADL's : Modified independent (completed full ADL at sink without (A) in static standing)                                        General ADL Comments: Pt was able to don doff underwear. PT static standing for all bathing. Pt demonstrate difficulty using cell phone and attempting to push buttons multiple times. OT giving patient a pen that has a stylus tip. pt was able to immediately dial phone and access buttons with incr easy     Vision                     Perception     Praxis      Pertinent Vitals/Pain VSS     Hand Dominance Right   Extremity/Trunk Assessment Upper Extremity Assessment Upper Extremity Assessment: Overall WFL for tasks assessed   Lower Extremity Assessment Lower Extremity Assessment: Defer to PT evaluation   Cervical / Trunk Assessment Cervical / Trunk Assessment: Normal   Communication Communication Communication: No difficulties   Cognition Arousal/Alertness: Awake/alert Behavior During Therapy: WFL for tasks assessed/performed Overall Cognitive Status: Within Functional Limits for tasks assessed                     General Comments       Exercises       Shoulder Instructions      Home Living Family/patient expects to be discharged to:: Private residence Living Arrangements: Children Available Help at Discharge:  Available PRN/intermittently Type of Home: Mobile home Home Access: Stairs to enter Entrance Stairs-Number of Steps: 5 Entrance Stairs-Rails: Right;Left;Can reach both Home Layout: One level     Bathroom Shower/Tub: Teacher, early years/pre: Standard     Home Equipment: Environmental consultant - 2 wheels;Cane - single point   Additional Comments: daughter comes by twice per week      Prior Functioning/Environment Level of Independence: Needs assistance  Gait / Transfers Assistance Needed: uses cane sometimes (since Feb AVR/CABG) ADL's / Homemaking Assistance Needed: daughter began getting groceries for her 3 weeks ago   Comments: Drives to and from HD- Sw has progress note addressing driving concerns    OT Diagnosis:      OT Problem List:     OT Treatment/Interventions:      OT Goals(Current goals can be found in the care plan section)    OT Frequency:     Barriers to D/C:            Co-evaluation              End of Session Nurse Communication: Mobility status;Precautions  Activity Tolerance: Patient tolerated treatment well Patient left: in chair;with call bell/phone within reach   Time: 1341-1408 OT Time Calculation (min): 27 min Charges:  OT General Charges $OT Visit: 1 Procedure OT Evaluation $Initial OT Evaluation Tier I: 1 Procedure OT Treatments $Self Care/Home Management : 8-22 mins G-Codes:    Peri Maris 07-23-13, 3:21 PM Pager: [phone]

## 2013-07-05 NOTE — Consult Note (Signed)
I have seen and examined this patient and agree with plan as outlined by D. Zeyfang, PA-C.  Pt denies weight loss but reports she has been leaving below her EDW.  Will challenge today at HD and follow pleural effusions and dyspnea. Governor Rooks Alec Jaros,MD 07/05/2013 2:51 PM

## 2013-07-05 NOTE — Progress Notes (Signed)
[facility] TEAM 1 - Stepdown/ICU TEAM Progress Note  [name] JKD:[id number] DOB: [birth year] DOA: 07/03/2013 PCP: Charlynn Court, NP  Admit HPI / Brief Narrative: 58 y.o. female with a history of coronary artery disease, aortic stenosis, status post AoVR and CABG Feb 2015, hypertension, hepatitis C status post liver transplant, ESRD on hemodialysis Tuesday Thursday Saturday, and hypothyroidism who presented with complaints of shortness of breath and cough.  When she was trying to sleep she started having trouble breathing when she was lying down flat. She tried to raise the number of pillows which initially helped but later on she continued to have progressively worsening shortness of breath. She also felt some palpitations at that point. She had bilateral chest pain which was pleuritic in nature stabbing and sharp worsening with deep breath.   She initally reported to [facility] ER where a CT was NEGATIVE for pulmonary embolism. Patient was transferred to [facility] for further evaluation.   HPI/Subjective: C/o feeling very weak, to the point she doesn't feel like she can walk safely on her on.  Denies focal weakness.  Reports continued SOB, but denies cp at the moment.  No nausea or vomiting.    Assessment/Plan:  Chest pain Has been readmitted once before following her CABG w/ similar complaints - care as per Cardiology   Moderate B Pleural effusions Denies missing HD - dry weight may need to be adjusted - tx via HD - Nephrology alerted to pt presence to allow for HD per T,Th,Sat schedule   CAD s/p CABG x1 No sx to suggest USAP - Cardiology following   AoS s/p Mech AoVR TTE pending to assure valve functioning properly   ESRD  Nephrology contacted via VM for non-emergent scheduled HD  Hyperglycemia - DM No listed hx of DM, but pt is on levemir at home  - check A1c on 03/11/2013 was 8.0. Repeat hemoglobin A1c pending.- Continue Levemir and sliding scale insulin.  Code Status:  FULL Family Communication: Updated patient, no family present at time of exam Disposition Plan: Home when medically stable.  Consultants: Cardiolgy Angelena Form) Nephrology: Dr Marval Regal  Procedures: TTE - pending   Antibiotics: none  DVT prophylaxis: warfarin  Objective: Blood pressure 145/46, pulse 61, temperature 97.6 F (36.4 C), temperature source Oral, resp. rate 16, height 5\' 4"  (1.626 m), weight 80.3 kg (177 lb 0.5 oz), SpO2 97.00%.  Intake/Output Summary (Last 24 hours) at 07/05/13 0941 Last data filed at 07/05/13 0736  Gross per 24 hour  Intake    490 ml  Output      0 ml  Net    490 ml   Exam: General: No acute respiratory distress Lungs: decreased breath sounds in B bases -Bibasilar crackles.no wheeze -  Cardiovascular: Regular rate and rhythm without gallop or rub  Abdomen: Nontender, nondistended, soft, bowel sounds positive, no rebound, no ascites, no appreciable mass Extremities: No significant cyanosis, clubbing;  Trace -1+ edema bilateral lower extremities  Data Reviewed: Basic Metabolic Panel:  Recent Labs Lab 07/03/13 2200 07/04/13 0711 07/05/13 0535  NA 134* 134* 131*  K 3.9 4.4 4.6  CL 93* 93* 90*  CO2 24 23 21   GLUCOSE 338* 277* 170*  BUN 25* 30* 45*  CREATININE 6.21* 6.90* 8.12*  CALCIUM 8.5 8.3* 8.3*  MG 2.0  --   --   PHOS 5.9*  --   --    Liver Function Tests:  Recent Labs Lab 07/03/13 2200 07/04/13 0711  AST 21 17  ALT  11 9  ALKPHOS 80 70  BILITOT 0.7 0.6  PROT 7.3 6.5  ALBUMIN 3.0* 2.7*   CBC:  Recent Labs Lab 07/03/13 2200 07/04/13 0711 07/05/13 0535  WBC 5.1 6.4 10.3  NEUTROABS 4.5 5.4  --   HGB 11.6* 10.7* 10.9*  HCT 35.4* 33.9* 33.9*  MCV 94.7 95.8 95.5  PLT 171 145* 172   Cardiac Enzymes:  Recent Labs Lab 07/03/13 2200 07/04/13 0238 07/04/13 0848  TROPONINI <0.30 <0.30 <0.30   BNP (last 3 results)  Recent Labs  07/20/12 0154 08/30/12 0758 09/01/12 0555  PROBNP 42149.0* 41152.0* 32077.0*    CBG:  Recent Labs Lab 07/04/13 0800 07/04/13 1151 07/04/13 1643 07/04/13 2107 07/05/13 0738  GLUCAP 229* 265* 217* 213* 154*    Recent Results (from the past 240 hour(s))  MRSA PCR SCREENING     Status: None   Collection Time    07/03/13  8:21 PM      Result Value Ref Range Status   MRSA by PCR NEGATIVE  NEGATIVE Final   Comment:            The GeneXpert MRSA Assay (FDA     approved for NASAL specimens     only), is one component of a     comprehensive MRSA colonization     surveillance program. It is not     intended to diagnose MRSA     infection nor to guide or     monitor treatment for     MRSA infections.  CULTURE, BLOOD (ROUTINE X 2)     Status: None   Collection Time    07/04/13 12:20 AM      Result Value Ref Range Status   Specimen Description BLOOD LEFT HAND   Final   Special Requests BOTTLES DRAWN AEROBIC ONLY 1CC   Final   Culture  Setup Time     Final   Value: 07/04/2013 09:10     Performed at [facility]   Culture     Final   Value:        BLOOD CULTURE RECEIVED NO GROWTH TO DATE CULTURE WILL BE HELD FOR 5 DAYS BEFORE ISSUING A FINAL NEGATIVE REPORT     Performed at [facility]   Report Status PENDING   Incomplete  CULTURE, BLOOD (ROUTINE X 2)     Status: None   Collection Time    07/04/13 12:24 AM      Result Value Ref Range Status   Specimen Description BLOOD RIGHT HAND   Final   Special Requests BOTTLES DRAWN AEROBIC ONLY 1CC   Final   Culture  Setup Time     Final   Value: 07/04/2013 09:09     Performed at [facility]   Culture     Final   Value:        BLOOD CULTURE RECEIVED NO GROWTH TO DATE CULTURE WILL BE HELD FOR 5 DAYS BEFORE ISSUING A FINAL NEGATIVE REPORT     Performed at [facility]   Report Status PENDING   Incomplete     Studies:  Recent x-ray studies have been reviewed in detail by the Attending Physician  Scheduled Meds:  Scheduled Meds: . amLODipine  5 mg Oral Daily  . aspirin EC  81  mg Oral Daily  . cinacalcet  30 mg Oral Daily  . DULoxetine  60 mg Oral QHS  . ferrous sulfate  325 mg Oral TID WC  . gabapentin  300 mg Oral QHS  . insulin aspart  0-5 Units Subcutaneous QHS  . insulin aspart  0-9 Units Subcutaneous TID WC  . insulin detemir  5 Units Subcutaneous QHS  . levothyroxine  150 mcg Oral QAC breakfast  . lidocaine  1 patch Transdermal Q24H  . LORazepam  0.5 mg Oral QHS  . metoprolol  50 mg Oral BID  . sodium chloride  3 mL Intravenous Q12H  . tacrolimus  5 mg Oral BID  . warfarin  2 mg Oral Once per day on Sun Mon Wed Fri  . warfarin  6 mg Oral Once per day on Tue Thu Sat  . Warfarin - Pharmacist Dosing Inpatient   Does not apply q1800    Time spent on care of this patient: 35 mins   Eugenie Filler , MD  [facility]  [phone] Pager - Text Page per Amion as per below:  On-Call/Text Page:      [email or web address]      password [email or web address]  If 7PM-7AM, please contact night-coverage [email or web address] Password [email or web address] 07/05/2013, 9:41 AM   LOS: 2 days

## 2013-07-05 NOTE — Procedures (Signed)
Patient was seen on dialysis and the procedure was supervised. BFR 400 Via L thigh AVG BP is 164/67.  Patient appears to be tolerating treatment well

## 2013-07-06 LAB — GLUCOSE, CAPILLARY
GLUCOSE-CAPILLARY: 127 mg/dL — AB (ref 70–99)
Glucose-Capillary: 145 mg/dL — ABNORMAL HIGH (ref 70–99)
Glucose-Capillary: 133 mg/dL — ABNORMAL HIGH (ref 70–99)
Glucose-Capillary: 192 mg/dL — ABNORMAL HIGH (ref 70–99)

## 2013-07-06 LAB — CBC
HEMATOCRIT: 33.8 % — AB (ref 36.0–46.0)
Hemoglobin: 10.7 g/dL — ABNORMAL LOW (ref 12.0–15.0)
MCH: 30.3 pg (ref 26.0–34.0)
MCHC: 31.7 g/dL (ref 30.0–36.0)
MCV: 95.8 fL (ref 78.0–100.0)
PLATELETS: 145 10*3/uL — AB (ref 150–400)
RBC: 3.53 MIL/uL — ABNORMAL LOW (ref 3.87–5.11)
RDW: 15.4 % (ref 11.5–15.5)
WBC: 6.2 10*3/uL (ref 4.0–10.5)

## 2013-07-06 LAB — RENAL FUNCTION PANEL
ALBUMIN: 2.7 g/dL — AB (ref 3.5–5.2)
BUN: 20 mg/dL (ref 6–23)
CHLORIDE: 99 meq/L (ref 96–112)
CO2: 29 mEq/L (ref 19–32)
CREATININE: 5.17 mg/dL — AB (ref 0.50–1.10)
Calcium: 8.3 mg/dL — ABNORMAL LOW (ref 8.4–10.5)
GFR calc Af Amer: 10 mL/min — ABNORMAL LOW (ref >90)
GFR, EST NON AFRICAN AMERICAN: 8 mL/min — AB (ref >90)
Glucose, Bld: 153 mg/dL — ABNORMAL HIGH (ref 70–99)
POTASSIUM: 4.2 meq/L (ref 3.7–5.3)
Phosphorus: 4.6 mg/dL (ref 2.3–4.6)
Sodium: 141 mEq/L (ref 137–147)

## 2013-07-06 LAB — PROTIME-INR
INR: 2.63 — ABNORMAL HIGH (ref 0.00–1.49)
PROTHROMBIN TIME: 27.2 s — AB (ref 11.6–15.2)

## 2013-07-06 MED ORDER — LEVOTHYROXINE SODIUM 125 MCG PO TABS
125.0000 ug | ORAL_TABLET | Freq: Every day | ORAL | Status: DC
  Administered 2013-07-07: 125 ug via ORAL
  Filled 2013-07-06 (×3): qty 1

## 2013-07-06 MED ORDER — WARFARIN SODIUM 3 MG PO TABS
3.0000 mg | ORAL_TABLET | Freq: Once | ORAL | Status: AC
  Administered 2013-07-06: 3 mg via ORAL
  Filled 2013-07-06: qty 1

## 2013-07-06 NOTE — Progress Notes (Signed)
Patient Name: [name] Date of Encounter: 07/06/2013     Principal Problem:   Pleural effusion Active Problems:   HEPATITIS C- s/p liver transplant 2011   HYPOTHYROIDISM   DIABETES MELLITUS, TYPE II   HTN- LVH, EF45- 50%. grade 2 diastolic dysf. echo 8/84/16   GERD   End stage renal disease on dialysis-TTS   Aortic stenosis, moderate-severe at cath Jan 2015. 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 No complaints except for feeling sleepy. However; she is most alert then yesterday. She would like to go home.    CURRENT MEDS . amLODipine  5 mg Oral Daily  . aspirin EC  81 mg Oral Daily  . cinacalcet  30 mg Oral Daily  . darbepoetin (ARANESP) injection - DIALYSIS  25 mcg Intravenous Q Tue-HD  . [START ON 07/07/2013] doxercalciferol  2 mcg Intravenous Q T,Th,Sa-HD  . DULoxetine  60 mg Oral QHS  . [START ON 07/07/2013] ferric gluconate (FERRLECIT/NULECIT) IV  62.5 mg Intravenous Q Thu-HD  . gabapentin  300 mg Oral QHS  . insulin aspart  0-5 Units Subcutaneous QHS  . insulin aspart  0-9 Units Subcutaneous TID WC  . insulin detemir  5 Units Subcutaneous QHS  . [START ON 07/07/2013] levothyroxine  125 mcg Oral QAC breakfast  . lidocaine  1 patch Transdermal Q24H  . LORazepam  0.5 mg Oral QHS  . metoprolol  50 mg Oral BID  . sodium chloride  3 mL Intravenous Q12H  . tacrolimus  5 mg Oral BID  . Warfarin - Pharmacist Dosing Inpatient   Does not apply q1800    OBJECTIVE  Filed Vitals:   07/05/13 1837 07/05/13 2105 07/06/13 0450 07/06/13 0815  BP: 126/60 162/70 144/69 161/70  Pulse: 66 76 73 70  Temp: 97.8 F (36.6 C) 98.1 F (36.7 C) 98.3 F (36.8 C) 97.8 F (36.6 C)  TempSrc: Oral Oral Oral Oral  Resp: 16 16 16 24   Height:      Weight: 159 lb 9.8 oz (72.4 kg) 161 lb 2.5 oz (73.1 kg)    SpO2: 100% 96% 98% 95%    Intake/Output Summary (Last 24  hours) at 07/06/13 1052 Last data filed at 07/06/13 6063  Gross per 24 hour  Intake    360 ml  Output   2100 ml  Net  -1740 ml   Filed Weights   07/05/13 1430 07/05/13 1837 07/05/13 2105  Weight: 165 lb 9.1 oz (75.1 kg) 159 lb 9.8 oz (72.4 kg) 161 lb 2.5 oz (73.1 kg)    PHYSICAL EXAM  General: Pleasant, NAD. Sleepy.  hard to focus Neuro: Alert and oriented X 3. Moves all extremities spontaneously. Psych: Normal affect. HEENT:  Normal  Neck: Supple without bruits or JVD. Lungs:  Resp regular and unlabored, CTA. Faint crackles at bases  Heart: RRR no s3, s4, or +,murmur. Abdomen: Soft, non-tender, non-distended, BS + x 4.  Extremities: No clubbing, cyanosis or edema. DP/PT/Radials 2+ and equal bilaterally.  Accessory Clinical Findings  CBC  Recent Labs  07/03/13 2200 07/04/13 0711 07/05/13 0535 07/06/13 0540  WBC 5.1 6.4 10.3 6.2  NEUTROABS 4.5 5.4  --   --   HGB 11.6* 10.7* 10.9* 10.7*  HCT 35.4* 33.9* 33.9* 33.8*  MCV 94.7 95.8 95.5 95.8  PLT 171 145* 172 016*   Basic Metabolic Panel  Recent  Labs  07/03/13 2200  07/05/13 0535 07/06/13 0540  NA 134*  < > 131* 141  K 3.9  < > 4.6 4.2  CL 93*  < > 90* 99  CO2 24  < > 21 29  GLUCOSE 338*  < > 170* 153*  BUN 25*  < > 45* 20  CREATININE 6.21*  < > 8.12* 5.17*  CALCIUM 8.5  < > 8.3* 8.3*  MG 2.0  --   --   --   PHOS 5.9*  --   --  4.6  < > = values in this interval not displayed. Liver Function Tests  Recent Labs  07/03/13 2200 07/04/13 0711 07/06/13 0540  AST 21 17  --   ALT 11 9  --   ALKPHOS 80 70  --   BILITOT 0.7 0.6  --   PROT 7.3 6.5  --   ALBUMIN 3.0* 2.7* 2.7*    Cardiac Enzymes  Recent Labs  07/03/13 2200 07/04/13 0238 07/04/13 0848  TROPONINI <0.30 <0.30 <0.30    Recent Labs  07/03/13 2200  TSH 0.086*    TELE  NSR  Radiology/Studies  2D ECHO 04/10/2013 LV EF: 55% ----------------------------------------------------------- Study Conclusions - Left ventricle: The  cavity size was normal. There was mild focal basal hypertrophy of the septum. Systolic function was normal. The estimated ejection fraction was 55%. There is hypokinesis of the basalinferior myocardium. Features are consistent with a pseudonormal left ventricular filling pattern, with concomitant abnormal relaxation and increased filling pressure (grade 2 diastolic dysfunction). - Aortic valve: A 64mmCarboMedics mechanical prosthesis was present. Gradients are in normal range for this type of prosthesis. Cannot exclude small perivalvular leak versus eccentric aortic regurgitation. Mean gradient: 85mm Hg (S). Peak gradient: 2mm Hg (S). - Mitral valve: Calcified annulus. Trivial regurgitation. - Left atrium: The atrium was mildly dilated. - Right ventricle: Systolic function was mildly reduced. - Right atrium: The atrium was mildly dilated. Central venous pressure: 75mm Hg (est). - Pulmonary arteries: Systolic pressure could not be accurately estimated. - Pericardium, extracardiac: There was no pericardial effusion. Impressions: - Mild basal septal hypertrophy with LVEF approximately 55%, basal inferior hypokinesis, grade 2 diastolic dysfunction. Mild biatrial enlargement. Reported to have 21 mm Carbo[employer] tophat aortic prosthesis - gradients are in normal range for this type of prosthesis. Cannot exclude small perivalvular leak versus eccentric aortic regurgitation. Unable to assess PASP.   ASSESSMENT AND PLAN [name] is a 58 y.o. female with a hx of CAD s/p CABG, AS s/p AVR, hypertension, hepatitis C s/p liver transplant, ESRD on hemodialysis (T,T, Sat) and hypothyroidism who was transferred from [facility] to [facility] on 07/03/13 with chest pain.   Chest pain: Cardiac markers are negative and ECG non acute. Chest pain thought to be pleuritic.  -- Pt is s/p recent CABG with LIMA to LAD and AVR in February 2015. CP could be musculoskeletal or inflammatory after CABG. --  CTA chest negative for PE. ( At [facility]). However, she did have moderate pleural effusions which could be a cause of her CP. -- 2D ECHO yesterday with EF 55%, mild basal septal hypertrophy, basal inferior hypokinesis, grade 2 diastolic dysfunction, mild biatrial enlargement. Unable to assess PASP.  CAD: s/p CABG   Aortic stenosis: S/p AVR. Continue coumadin. Valve function normal per TTE. -- 2D ECHO reported to have 21 mm [employer] tophat aortic prosthesis - gradients are in normal range for this type of prosthesis. Cannot exclude small perivalvular leak versus eccentric aortic  regurgitation.   ESRD: on HD  -- Volume management per dialysis. Run yesterday and feeling better.   Thyroid dysfunction - TSH low (0.086). Free T4 mildly elevated at 1.83  DISPO- No further cardiac work up. Will likely sign off from a cardiology standpoint. MD to see.   Signed, Perry Mount PA-C  Agree with above. Patient is doing well. Aortic prosthetic valve functioning normally. Will sign off now. Outpatient cardiology followup with Dr. Ellouise Newer.

## 2013-07-06 NOTE — Progress Notes (Signed)
ANTICOAGULATION CONSULT NOTE - Follow Up Consult  Pharmacy Consult  :  Coumadin  Indication  :  Mechanical AVR  Dosing Weight: 80 kg   Recent Labs  07/03/13 2200 07/04/13 0711 07/05/13 0535 07/05/13 1255 07/06/13 0540  HGB 11.6* 10.7* 10.9*  --  10.7*  HCT 35.4* 33.9* 33.9*  --  33.8*  PLT 171 145* 172  --  145*  APTT 47*  --   --   --   --   LABPROT 23.8* 26.3*  --  32.7* 27.2*  INR 2.21* 2.52*  --  3.35* 2.63*  CREATININE 6.21* 6.90* 8.12*  --  5.17*     Medications: Scheduled:  . amLODipine  5 mg Oral Daily  . aspirin EC  81 mg Oral Daily  . cinacalcet  30 mg Oral Daily  . darbepoetin (ARANESP) injection - DIALYSIS  25 mcg Intravenous Q Tue-HD  . [START ON 07/07/2013] doxercalciferol  2 mcg Intravenous Q T,Th,Sa-HD  . DULoxetine  60 mg Oral QHS  . [START ON 07/07/2013] ferric gluconate (FERRLECIT/NULECIT) IV  62.5 mg Intravenous Q Thu-HD  . gabapentin  300 mg Oral QHS  . insulin aspart  0-5 Units Subcutaneous QHS  . insulin aspart  0-9 Units Subcutaneous TID WC  . insulin detemir  5 Units Subcutaneous QHS  . [START ON 07/07/2013] levothyroxine  125 mcg Oral QAC breakfast  . lidocaine  1 patch Transdermal Q24H  . LORazepam  0.5 mg Oral QHS  . metoprolol  50 mg Oral BID  . sodium chloride  3 mL Intravenous Q12H  . tacrolimus  5 mg Oral BID  . Warfarin - Pharmacist Dosing Inpatient   Does not apply q1800    Infusions:     Assessment:  58 y/o female on chronic Coumadin for a mechanical AVR.  INR goal set at 2.5 - 3.0.  Home Coumadin dose 2 mg MWFSat, 4 mg TTSun.  INR 2.63 after no Coumadin dose yesterday.  Goal:  INR Goal 2.5 - 3.0    Plan: 1. Coumadin 3 mg po today. 2. Daily INR's, Platelet count, CBC.  Monitor for bleeding complications.  3. Discharge Coumadin education completed.   Zymeir Salminen, Craig Guess,  Pharm.D  07/06/2013, 1:27 PM

## 2013-07-06 NOTE — Clinical Social Work Note (Signed)
CSW advised that patient ready for discharge today and call made to [facility] to inform them of d/c and to initiate her transport to dialysis on Thursday, 5/28. CSW informed by staff member that patient would not be able to receive transportation as she lives outside of [place]'s city limits. Patient also talked with [facility] staff and the reason for denying her transport explained to her. Patient will continue driving herself to dialysis as she reports that her children and other family/friends work and cannot transport her.  Cammeron Greis Givens, MSW, LCSW [phone]

## 2013-07-06 NOTE — Progress Notes (Signed)
[place] TEAM 1 - Stepdown/ICU TEAM Progress Note  [name] OEU:[id number] DOB: 1955/11/03 DOA: 07/03/2013 PCP: Charlynn Court, NP  Admit HPI / Brief Narrative: 58 y.o. female with a history of coronary artery disease, aortic stenosis, status post AoVR and CABG Feb 2015, hypertension, hepatitis C status post liver transplant, ESRD on hemodialysis Tuesday Thursday Saturday, and hypothyroidism who presented with complaints of shortness of breath and cough.  When she was trying to sleep she started having trouble breathing when she was lying down flat. She tried to raise the number of pillows which initially helped but later on she continued to have progressively worsening shortness of breath. She also felt some palpitations at that point. She had bilateral chest pain which was pleuritic in nature stabbing and sharp worsening with deep breath.   She initally reported to [facility] ER where a CT was NEGATIVE for pulmonary embolism. Patient was transferred to [facility] for further evaluation.   HPI/Subjective: Patient states shortness of breath and chest pain have improved since dialysis and is feeling better.  Assessment/Plan:  Chest pain Likely secondary to pleural effusions. Cardiac enzymes are negative. EKG with no ST-T wave abnormalities. Clinical improvement with hemodialysis. 2-D echo with EF of 55%. Per cardiology. No further workup needed at this time. Outpatient followup.  Moderate B Pleural effusions Denies missing HD - dry weight may need to be adjusted - tx via HD - Per renal.  CAD s/p CABG x1 No sx to suggest USAP - Cardiology following   AoS s/p Mech AoVR TTE with EF of 55%. Aortic valve working well. Continue coumadin.  ESRD  Nephrology ff. Patient with HD yesterday. Per renal.  Hyperglycemia - DM No listed hx of DM, but pt is on levemir at home  -  A1c on 03/11/2013 was 8.0. Continue Levemir and sliding scale insulin.  Code Status: FULL Family Communication:  Updated patient, no family present at time of exam Disposition Plan: Home when medically stable.  Consultants: Cardiolgy Angelena Form) Nephrology: Dr Marval Regal  Procedures: TTE - 07/05/2013  Antibiotics: none  DVT prophylaxis: warfarin  Objective: Blood pressure 161/70, pulse 70, temperature 97.8 F (36.6 C), temperature source Oral, resp. rate 24, height 5\' 4"  (1.626 m), weight 73.1 kg (161 lb 2.5 oz), SpO2 95.00%.  Intake/Output Summary (Last 24 hours) at 07/06/13 0855 Last data filed at 07/06/13 1540  Gross per 24 hour  Intake    600 ml  Output   2100 ml  Net  -1500 ml   Exam: General: No acute respiratory distress Lungs: decreased breath sounds in B bases -Bibasilar crackles.no wheeze -  Cardiovascular: Regular rate and rhythm without gallop or rub  Abdomen: Nontender, nondistended, soft, bowel sounds positive, no rebound, no ascites, no appreciable mass Extremities: No significant cyanosis, clubbing, edema;  Data Reviewed: Basic Metabolic Panel:  Recent Labs Lab 07/03/13 2200 07/04/13 0711 07/05/13 0535 07/06/13 0540  NA 134* 134* 131* 141  K 3.9 4.4 4.6 4.2  CL 93* 93* 90* 99  CO2 24 23 21 29   GLUCOSE 338* 277* 170* 153*  BUN 25* 30* 45* 20  CREATININE 6.21* 6.90* 8.12* 5.17*  CALCIUM 8.5 8.3* 8.3* 8.3*  MG 2.0  --   --   --   PHOS 5.9*  --   --  4.6   Liver Function Tests:  Recent Labs Lab 07/03/13 2200 07/04/13 0711 07/06/13 0540  AST 21 17  --   ALT 11 9  --   ALKPHOS 80 70  --  BILITOT 0.7 0.6  --   PROT 7.3 6.5  --   ALBUMIN 3.0* 2.7* 2.7*   CBC:  Recent Labs Lab 07/03/13 2200 07/04/13 0711 07/05/13 0535 07/06/13 0540  WBC 5.1 6.4 10.3 6.2  NEUTROABS 4.5 5.4  --   --   HGB 11.6* 10.7* 10.9* 10.7*  HCT 35.4* 33.9* 33.9* 33.8*  MCV 94.7 95.8 95.5 95.8  PLT 171 145* 172 145*   Cardiac Enzymes:  Recent Labs Lab 07/03/13 2200 07/04/13 0238 07/04/13 0848  TROPONINI <0.30 <0.30 <0.30   BNP (last 3 results)  Recent Labs   07/20/12 0154 08/30/12 0758 09/01/12 0555  PROBNP 42149.0* 41152.0* 32077.0*   CBG:  Recent Labs Lab 07/04/13 2107 07/05/13 0738 07/05/13 1149 07/05/13 2100 07/06/13 0739  GLUCAP 213* 154* 139* 135* 145*    Recent Results (from the past 240 hour(s))  MRSA PCR SCREENING     Status: None   Collection Time    07/03/13  8:21 PM      Result Value Ref Range Status   MRSA by PCR NEGATIVE  NEGATIVE Final   Comment:            The GeneXpert MRSA Assay (FDA     approved for NASAL specimens     only), is one component of a     comprehensive MRSA colonization     surveillance program. It is not     intended to diagnose MRSA     infection nor to guide or     monitor treatment for     MRSA infections.  CULTURE, BLOOD (ROUTINE X 2)     Status: None   Collection Time    07/04/13 12:20 AM      Result Value Ref Range Status   Specimen Description BLOOD LEFT HAND   Final   Special Requests BOTTLES DRAWN AEROBIC ONLY 1CC   Final   Culture  Setup Time     Final   Value: 07/04/2013 09:10     Performed at [facility]   Culture     Final   Value:        BLOOD CULTURE RECEIVED NO GROWTH TO DATE CULTURE WILL BE HELD FOR 5 DAYS BEFORE ISSUING A FINAL NEGATIVE REPORT     Performed at [facility]   Report Status PENDING   Incomplete  CULTURE, BLOOD (ROUTINE X 2)     Status: None   Collection Time    07/04/13 12:24 AM      Result Value Ref Range Status   Specimen Description BLOOD RIGHT HAND   Final   Special Requests BOTTLES DRAWN AEROBIC ONLY 1CC   Final   Culture  Setup Time     Final   Value: 07/04/2013 09:09     Performed at [facility]   Culture     Final   Value:        BLOOD CULTURE RECEIVED NO GROWTH TO DATE CULTURE WILL BE HELD FOR 5 DAYS BEFORE ISSUING A FINAL NEGATIVE REPORT     Performed at [facility]   Report Status PENDING   Incomplete     Studies:  Recent x-ray studies have been reviewed in detail by the Attending  Physician  Scheduled Meds:  Scheduled Meds: . amLODipine  5 mg Oral Daily  . aspirin EC  81 mg Oral Daily  . cinacalcet  30 mg Oral Daily  . darbepoetin (ARANESP) injection - DIALYSIS  25 mcg Intravenous Q  Tue-HD  . [START ON 07/07/2013] doxercalciferol  2 mcg Intravenous Q T,Th,Sa-HD  . DULoxetine  60 mg Oral QHS  . [START ON 07/07/2013] ferric gluconate (FERRLECIT/NULECIT) IV  62.5 mg Intravenous Q Thu-HD  . gabapentin  300 mg Oral QHS  . insulin aspart  0-5 Units Subcutaneous QHS  . insulin aspart  0-9 Units Subcutaneous TID WC  . insulin detemir  5 Units Subcutaneous QHS  . levothyroxine  150 mcg Oral QAC breakfast  . lidocaine  1 patch Transdermal Q24H  . LORazepam  0.5 mg Oral QHS  . metoprolol  50 mg Oral BID  . sodium chloride  3 mL Intravenous Q12H  . tacrolimus  5 mg Oral BID  . Warfarin - Pharmacist Dosing Inpatient   Does not apply q1800    Time spent on care of this patient: 35 mins   Eugenie Filler , MD  [phone]  [facility] Office  [phone] Pager - Text Page per Amion as per below:  On-Call/Text Page:      [email or web address]      password [email or web address]  If 7PM-7AM, please contact night-coverage [email or web address] Password [facility] 07/06/2013, 8:55 AM   LOS: 3 days

## 2013-07-06 NOTE — Care Management Note (Signed)
CARE MANAGEMENT NOTE 07/06/2013  Patient:  [name], [name]   Account Number:  [id number]  Date Initiated:  07/06/2013  Documentation initiated by:  Gailyn Crook  Subjective/Objective Assessment:   CM following for progression and d/c planning     Action/Plan:   No D/C needs identified, CSW assisted pt with transportation to outpatient hemodialysis needs.   Anticipated DC Date:  07/06/2013   Anticipated DC Plan:  HOME/SELF CARE         Choice offered to / List presented to:             Status of service:  Completed, signed off Medicare Important Message given?  YES (If response is "NO", the following Medicare IM given date fields will be blank) Date Medicare IM given:  07/06/2013 Date Additional Medicare IM given:    Discharge Disposition:  HOME/SELF CARE  Per UR Regulation:    If discussed at Long Length of Stay Meetings, dates discussed:    Comments:

## 2013-07-06 NOTE — Progress Notes (Signed)
Subjective:  Feeling better, no sob , tolerated hd yesterday  Objective Vital signs in last 24 hours: Filed Vitals:   07/05/13 1800 07/05/13 1837 07/05/13 2105 07/06/13 0450  BP: 160/67 126/60 162/70 144/69  Pulse: 64 66 76 73  Temp:  97.8 F (36.6 C) 98.1 F (36.7 C) 98.3 F (36.8 C)  TempSrc:  Oral Oral Oral  Resp: 16 16 16 16   Height:      Weight:  72.4 kg (159 lb 9.8 oz) 73.1 kg (161 lb 2.5 oz)   SpO2:  100% 96% 98%   Weight change: -5.2 kg (-11 lb 7.4 oz)  Physical Exam: General: alert , NAD. Pleasant  Heart: RRR, 2/6 sem lsb, no rub or gallop  Lungs: Decr BS R base But clearer than yesterday   Abdomen: bs pos. Soft, NT< ND  Extremities: no pedal edema   Dialysis Access: Pos. Bruit L fem Avgg   Dialysis Orders: Center: [facility] on TTS .  EDW 72.5 HD Bath 2.0k. 2.0 ca Time 4 Heparin no . Access L fem avgg BFR 400 DFR 800  Hect. 2 mcg IV/HD Aranesp 35mcg weekly Units IV/HD Venofer 50mg  wk  Problem/Plan:  1. Chest pain/sob withVolume overload and Pleural Effusion=Symp better after HD yesterday  2100 ccuf wt to 72.4 /needing lower edw at [employer] / Card seeing   2. ESRD - HD  TTS on schedule 3. Hypertension/volume - stable bp now Needs volume off with hd in am  4. Anemia - ESA . Weekly venofer 10.7 hgb 5. 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 AVR/CAD/CABG- On coumadin/ card seeing/ CE neg. 7. Liver transplant- Meds  Ernest Haber, PA-C [facility] Beeper [phone] 07/06/2013,7:47 AM  LOS: 3 days   Labs: Basic Metabolic Panel:  Recent Labs Lab 07/03/13 2200 07/04/13 0711 07/05/13 0535 07/06/13 0540  NA 134* 134* 131* 141  K 3.9 4.4 4.6 4.2  CL 93* 93* 90* 99  CO2 24 23 21 29   GLUCOSE 338* 277* 170* 153*  BUN 25* 30* 45* 20  CREATININE 6.21* 6.90* 8.12* 5.17*  CALCIUM 8.5 8.3* 8.3* 8.3*  PHOS 5.9*  --   --  4.6    Recent Labs Lab 07/03/13 2200 07/04/13 0711 07/06/13 0540  AST 21 17  --    ALT 11 9  --   ALKPHOS 80 70  --   BILITOT 0.7 0.6  --   PROT 7.3 6.5  --   ALBUMIN 3.0* 2.7* 2.7*     Recent Labs Lab 07/03/13 2200 07/04/13 0711 07/05/13 0535 07/06/13 0540  WBC 5.1 6.4 10.3 6.2  NEUTROABS 4.5 5.4  --   --   HGB 11.6* 10.7* 10.9* 10.7*  HCT 35.4* 33.9* 33.9* 33.8*  MCV 94.7 95.8 95.5 95.8  PLT 171 145* 172 145*   Cardiac Enzymes:  Recent Labs Lab 07/03/13 2200 07/04/13 0238 07/04/13 0848  TROPONINI <0.30 <0.30 <0.30   CBG:  Recent Labs Lab 07/04/13 1643 07/04/13 2107 07/05/13 0738 07/05/13 1149 07/05/13 2100  GLUCAP 217* 213* 154* 139* 135*   Medications:   . amLODipine  5 mg Oral Daily  . aspirin EC  81 mg Oral Daily  . cinacalcet  30 mg Oral Daily  . darbepoetin (ARANESP) injection - DIALYSIS  25 mcg Intravenous Q Tue-HD  . [START ON 07/07/2013] doxercalciferol  2 mcg Intravenous Q T,Th,Sa-HD  . DULoxetine  60 mg Oral QHS  . [START ON 07/07/2013] ferric  gluconate (FERRLECIT/NULECIT) IV  62.5 mg Intravenous Q Thu-HD  . gabapentin  300 mg Oral QHS  . insulin aspart  0-5 Units Subcutaneous QHS  . insulin aspart  0-9 Units Subcutaneous TID WC  . insulin detemir  5 Units Subcutaneous QHS  . levothyroxine  150 mcg Oral QAC breakfast  . lidocaine  1 patch Transdermal Q24H  . LORazepam  0.5 mg Oral QHS  . metoprolol  50 mg Oral BID  . sodium chloride  3 mL Intravenous Q12H  . tacrolimus  5 mg Oral BID  . Warfarin - Pharmacist Dosing Inpatient   Does not apply [phone]

## 2013-07-06 NOTE — Progress Notes (Signed)
I have seen and examined this patient and agree with plan as outlined by D. Zeyfang, PA-C. Governor Rooks Scarlette Hogston,MD 07/06/2013 2:43 PM

## 2013-07-07 DIAGNOSIS — I10 Essential (primary) hypertension: Secondary | ICD-10-CM

## 2013-07-07 DIAGNOSIS — I359 Nonrheumatic aortic valve disorder, unspecified: Secondary | ICD-10-CM

## 2013-07-07 LAB — CBC
HEMATOCRIT: 32.9 % — AB (ref 36.0–46.0)
HEMOGLOBIN: 10.9 g/dL — AB (ref 12.0–15.0)
MCH: 31.1 pg (ref 26.0–34.0)
MCHC: 33.1 g/dL (ref 30.0–36.0)
MCV: 93.7 fL (ref 78.0–100.0)
Platelets: 157 10*3/uL (ref 150–400)
RBC: 3.51 MIL/uL — AB (ref 3.87–5.11)
RDW: 14.8 % (ref 11.5–15.5)
WBC: 5.4 10*3/uL (ref 4.0–10.5)

## 2013-07-07 LAB — RENAL FUNCTION PANEL
Albumin: 2.8 g/dL — ABNORMAL LOW (ref 3.5–5.2)
BUN: 34 mg/dL — ABNORMAL HIGH (ref 6–23)
CALCIUM: 8.6 mg/dL (ref 8.4–10.5)
CO2: 25 mEq/L (ref 19–32)
Chloride: 96 mEq/L (ref 96–112)
Creatinine, Ser: 7.42 mg/dL — ABNORMAL HIGH (ref 0.50–1.10)
GFR calc Af Amer: 6 mL/min — ABNORMAL LOW (ref >90)
GFR calc non Af Amer: 5 mL/min — ABNORMAL LOW (ref >90)
Glucose, Bld: 113 mg/dL — ABNORMAL HIGH (ref 70–99)
PHOSPHORUS: 5.8 mg/dL — AB (ref 2.3–4.6)
POTASSIUM: 3.4 meq/L — AB (ref 3.7–5.3)
SODIUM: 137 meq/L (ref 137–147)

## 2013-07-07 LAB — GLUCOSE, CAPILLARY
GLUCOSE-CAPILLARY: 154 mg/dL — AB (ref 70–99)
Glucose-Capillary: 157 mg/dL — ABNORMAL HIGH (ref 70–99)

## 2013-07-07 LAB — PROTIME-INR
INR: 2.47 — ABNORMAL HIGH (ref 0.00–1.49)
PROTHROMBIN TIME: 25.9 s — AB (ref 11.6–15.2)

## 2013-07-07 MED ORDER — LIDOCAINE 5 % EX PTCH: 1.0000 | MEDICATED_PATCH | CUTANEOUS | Status: DC

## 2013-07-07 MED ORDER — WARFARIN SODIUM 4 MG PO TABS
4.0000 mg | ORAL_TABLET | ORAL | Status: AC
  Administered 2013-07-07: 4 mg via ORAL
  Filled 2013-07-07: qty 1

## 2013-07-07 MED ORDER — DOXERCALCIFEROL 4 MCG/2ML IV SOLN
INTRAVENOUS | Status: AC
  Administered 2013-07-07: 2 ug via INTRAVENOUS
  Filled 2013-07-07: qty 2

## 2013-07-07 MED ORDER — INSULIN DETEMIR 100 UNIT/ML ~~LOC~~ SOLN: 5.0000 [IU] | Freq: Every day | SUBCUTANEOUS | Status: DC

## 2013-07-07 MED ORDER — WARFARIN SODIUM 2 MG PO TABS: 2.0000 mg | ORAL_TABLET | ORAL | Status: DC

## 2013-07-07 NOTE — Progress Notes (Signed)
ANTICOAGULATION CONSULT NOTE - Follow Up Consult  Pharmacy Consult  :  Coumadin Indication  :  Mechanical AVR   Dosing Weight: 74.2 kg   Recent Labs  07/05/13 0535 07/05/13 1255 07/06/13 0540 07/07/13 1200  HGB 10.9*  --  10.7* 10.9*  HCT 33.9*  --  33.8* 32.9*  PLT 172  --  145* 157  LABPROT  --  32.7* 27.2* 25.9*  INR  --  3.35* 2.63* 2.47*  CREATININE 8.12*  --  5.17* 7.42*     Medications: Scheduled:  . amLODipine  5 mg Oral Daily  . aspirin EC  81 mg Oral Daily  . cinacalcet  30 mg Oral Daily  . darbepoetin (ARANESP) injection - DIALYSIS  25 mcg Intravenous Q Tue-HD  . doxercalciferol  2 mcg Intravenous Q T,Th,Sa-HD  . DULoxetine  60 mg Oral QHS  . ferric gluconate (FERRLECIT/NULECIT) IV  62.5 mg Intravenous Q Thu-HD  . gabapentin  300 mg Oral QHS  . insulin aspart  0-5 Units Subcutaneous QHS  . insulin aspart  0-9 Units Subcutaneous TID WC  . insulin detemir  5 Units Subcutaneous QHS  . levothyroxine  125 mcg Oral QAC breakfast  . lidocaine  1 patch Transdermal Q24H  . LORazepam  0.5 mg Oral QHS  . metoprolol  50 mg Oral BID  . sodium chloride  3 mL Intravenous Q12H  . tacrolimus  5 mg Oral BID  . Warfarin - Pharmacist Dosing Inpatient   Does not apply q1800    Assessment:  58 y/o female on chronic Coumadin for a mechanical AVR. INR goal set at 2.5 - 3.0.   Home Coumadin dose 2 mg MWFSat, 4 mg TTSun.  INR 2.47.  No evidence of bleeding complications noted    Goal:  INR Target Goal 2.5 - 3.0    Plan: 1. Begin home Coumadin dosing schedule.  Coumadin 2 mg MWFSat, 4 mg TTSun. 2. Daily INR's, Platelet count, CBC.  Monitor for bleeding complications.   3. Coumadin discharge education completed.  Ziair Penson, Craig Guess,  Pharm.D  07/07/2013, 1:18 PM

## 2013-07-07 NOTE — Progress Notes (Signed)
I have seen and examined this patient and agree with plan as outlined by D. Zeyfang, PA-C. Broadus John A Brytney Somes,MD 07/07/2013 11:44 AM

## 2013-07-07 NOTE — Progress Notes (Signed)
Physical Therapy Treatment Patient Details Name: [name] MRN: [id number] DOB: Nov 01, 1955 Today's Date: 07/07/2013    History of Present Illness 58 yo female admitted with CP and SOB when lying down. + BIl pleural effusions  HX of ESRD on HD, CAD, AVR/ CABG s/p Liver transplant due to hep C    PT Comments    Pt. Tolerated session well.  Pt. Was able to step over sidelying  Trash can, able to pick up item off floor , able to do 360 degree turn in both directions without LOB noted.  She does tend to walk quickly but with no LOB.  She says her daughter will drive her to HD on Saturday but that she will need to drive herself on next Tuesday.  Pt. Is scheduled for second shift HD today so proceeded with PT session this am   Since she would not be available before  This therapist ends her  Workday today.      Follow Up Recommendations  Home health PT;Supervision - Intermittent     Equipment Recommendations  None recommended by PT    Recommendations for Other Services       Precautions / Restrictions Precautions Precautions: None Restrictions Weight Bearing Restrictions: No    Mobility  Bed Mobility Overal bed mobility: Independent                Transfers Overall transfer level: Modified independent Equipment used: None Transfers: Sit to/from Stand Sit to Stand: Modified independent (Device/Increase time)         General transfer comment: slightly increased time needed; able to transfer from bed to chair and back without use of UEs  and no overt LOB  Ambulation/Gait Ambulation/Gait assistance: Modified independent (Device/Increase time) Ambulation Distance (Feet): 400 Feet Assistive device: None;Rolling walker (2 wheeled) Gait Pattern/deviations: Step-through pattern   Gait velocity interpretation: >2.62 ft/sec, indicative of independent community ambulator General Gait Details: no overt LOB noted with ambulation in hallway   Stairs             Wheelchair Mobility    Modified Rankin (Stroke Patients Only)       Balance Overall balance assessment: Modified Independent                                  Cognition Arousal/Alertness: Awake/alert Behavior During Therapy: WFL for tasks assessed/performed Overall Cognitive Status: Within Functional Limits for tasks assessed                      Exercises      General Comments        Pertinent Vitals/Pain See vitals tab     Home Living                      Prior Function            PT Goals (current goals can now be found in the care plan section) Progress towards PT goals: Progressing toward goals    Frequency  Min 3X/week    PT Plan Current plan remains appropriate    Co-evaluation             End of Session Equipment Utilized During Treatment: Gait belt Activity Tolerance: Patient tolerated treatment well Patient left: in chair;with call bell/phone within reach     Time: 1015-1038 PT Time Calculation (min): 23 min  Charges:  $Gait Training:  23-37 mins                    G Codes:      Ladona Ridgel 07/07/2013, 11:12 AM Gerlean Ren PT Acute Rehab Services [phone] [facility] [phone]

## 2013-07-07 NOTE — Progress Notes (Signed)
Pt discharged to home after visit summary reviewed and pt capable of re verbalizing medications and follow up appointments. Pt remains stable. No signs and symptoms of distress. Educated to return to ER in the event of SOB, dizziness, chest pain, or fainting. Lakethia Coppess, RN   

## 2013-07-07 NOTE — Plan of Care (Signed)
Problem: Phase I Progression Outcomes Goal: EF % per last Echo/documented,Core Reminder form on chart Outcome: Completed/Met Date Met:  07/07/13 EF=55%-per echo 04/10/2013

## 2013-07-07 NOTE — Discharge Summary (Addendum)
Physician Discharge Summary  [name] OIZ:[id number] DOB: 01/24/1956 DOA: 07/03/2013  PCP: Charlynn Court, NP  Admit date: 07/03/2013 Discharge date: 07/07/2013  Time spent: >30 minutes  Recommendations for Outpatient Follow-up:  Follow-up Information   Follow up with Charlynn Court, NP. (in 1-2weeks.)    Specialty:  Nurse Practitioner   Contact information:   [place] [id number] [phone]       Follow up with Lauree Chandler, MD. (in 2weeks, call for appointment upon discharge)    Specialty:  Cardiology   Contact information:   [place] [place] [address] [phone]       Please follow up. (Renal/dialysis as directed, lab(PT/INR) on saturday at next dialysis)       Discharge Diagnoses:  Principal Problem:   Pleural effusion Active Problems:   HEPATITIS C- s/p liver transplant 2011   HYPOTHYROIDISM   DIABETES MELLITUS, TYPE II   HTN- LVH, EF45- 50%. grade 2 diastolic dysf. echo 2/40/97   GERD   End stage renal disease on dialysis-TTS   Aortic stenosis, moderate-severe at cath Jan 2015. 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 female with Past medical history of coronary artery disease, aortic stenosis, status post aVR and CABG, hypertension, hepatitis C status post liver transplant, ESRD on hemodialysis Tuesday Thursday Saturday, hypothyroidism,.  Who presented with complaints of shortness of breath and cough. She mentions that she went to hemodialysis Saturday and was at her baseline.  After reaching home in the night when she was trying to sleep she started having trouble breathing  when she was lying down flat. She tried to raise the number of pillows which initially helped but later on she continues to have progressively worsening shortness of breath. She also felt some palpitation at that point. She started having some dry cough since then and therefore decided to sit up which improved her cough as well as shortness of breath. She had bilateral chest pain which was pleuritic in nature stabbing and sharp worsening with deep breath.  She denies any fever or chills. She complains of some dizziness and nausea but no vomiting. She denies any focal neurological deficit.  The next day on Sunday she started having progressively worsening shortness of breath on exertion and therefore she went to the ED. In [place] hospital CT PE WAS NEGATIVE for pulmonary embolism. Patient was transferred here for further workup.      Hospital Course:  Chest pain  Upon admission patient had cardiac enzymes cycled and they came back negative, EKG with no ST-T wave abnormalities.  2-D echo with EF of 55%. Cardiology was consulted and followed patient and the impression was that the pleural effusions were possibly causing her symptoms. She improved clinically with dialysis and has remained chest pain-free. Cardiology followed and recommended No further workup needed at this time. She is to followup outpatient.  Moderate B Pleural effusions  Renal was consulted and her volume was mananaged with dialysis  CAD s/p CABG x1  No sx to suggest, as discussed above, she had no symptoms to suggest unstable  angina. Per enzymes were cycled came back negative, cardiology followed and recommended no further workup AoS s/p Mech AoVR  TTE with EF of 55%. Aortic valve working well. Continue coumadin. Her INR remained therapeutic today at 2.47 she is to followup at dialysis for recheck of her INR-at her next dialysis on Saturday. ESRD  Nephrology followed and analyzed patient in the hospital. Hyperglycemia - DM  No  listed hx of DM, but pt is on levemir at home - A1c on 03/11/2013 was 8.0. Continue Levemir-her dose was increased to 5 units each bedtime, and she is to continue sliding scale insulin as previously.      Consultants:  Cardiolgy Angelena Form)  Nephrology: Dr Marval Regal  Procedures:  TTE - 07/05/2013    Discharge Exam: Filed Vitals:   07/07/13 1500  BP: 179/65  Pulse: 66  Temp:   Resp:    Exam:  General: No acute respiratory distress  Lungs: decreased breath sounds in B bases, no crackles.no wheeze -  Cardiovascular: Regular rate and rhythm without gallop or rub  Abdomen: Nontender, nondistended, soft, bowel sounds positive, no rebound, no ascites, no appreciable mass  Extremities: No significant cyanosis, clubbing, edema;   Discharge Instructions You were cared for by a hospitalist during your hospital stay. If you have any questions about your discharge medications or the care you received while you were in the hospital after you are discharged, you can call the unit and asked to speak with the hospitalist on call if the hospitalist that took care of you is not available. Once you are discharged, your primary care physician will handle any further medical issues. Please note that NO REFILLS for any discharge medications will be authorized once you are discharged, as it is imperative that you return to your primary care physician (or establish a relationship with a primary care physician if you do not have one) for your aftercare needs so that they can reassess your need for medications and monitor your lab values.  Discharge Instructions   Diet renal 60/[id number]    Complete by:  As directed      Increase activity slowly    Complete by:  As directed             Medication List         albuterol 90 MCG/ACT inhaler  Commonly known as:  PROVENTIL,VENTOLIN  Inhale 1-2 puffs into the lungs every 6 (six) hours as needed for wheezing or shortness of breath.     amLODipine 5  MG tablet  Commonly known as:  NORVASC  Take 5 mg by mouth daily.     aspirin EC 81 MG tablet  Take 81 mg by mouth daily.     calcium carbonate 500 MG chewable tablet  Commonly known as:  TUMS - dosed in mg elemental calcium  Chew 1 tablet by mouth daily.     cinacalcet 30 MG tablet  Commonly known as:  SENSIPAR  Take 30 mg by mouth daily.     DULoxetine 60 MG capsule  Commonly known as:  CYMBALTA  Take 60 mg by mouth at bedtime.     gabapentin 300 MG capsule  Commonly known as:  NEURONTIN  Take 300 mg by mouth at bedtime.     insulin detemir 100 UNIT/ML injection  Commonly known as:  LEVEMIR  Inject 0.05 mLs (5 Units total) into the skin at bedtime. On sliding scale: if over 150, take 2 units; if over 300, take 3 units; etc.  levothyroxine 150 MCG tablet  Commonly known as:  SYNTHROID, LEVOTHROID  Take 150 mcg by mouth daily before breakfast.     lidocaine 5 %  Commonly known as:  LIDODERM  Place 1 patch onto the skin daily. Remove & Discard patch within 12 hours or as directed by MD     LORazepam 0.5 MG tablet  Commonly known as:  ATIVAN  Take 0.5 mg by mouth at bedtime as needed for sleep.     metoprolol 50 MG tablet  Commonly known as:  LOPRESSOR  Take 1 tablet (50 mg total) by mouth 2 (two) times daily.     multivitamin with minerals Tabs tablet  Take 1 tablet by mouth at bedtime.     promethazine 12.5 MG tablet  Commonly known as:  PHENERGAN  Take 12.5 mg by mouth every 4 (four) hours as needed for nausea.     tacrolimus 1 MG capsule  Commonly known as:  PROGRAF  Take 5 mg by mouth 2 (two) times daily.     traMADol 50 MG tablet  Commonly known as:  ULTRAM  Take 50 mg by mouth every 12 (twelve) hours as needed.     VELPHORO 500 MG chewable tablet  Generic drug:  sucroferric oxyhydroxide  Chew 1 tablet by mouth 3 (three) times daily with meals.     warfarin 2 MG tablet  Commonly known as:  COUMADIN  Take 2 mg by mouth daily at 6 PM. 4mg  on  Sunday, Tuesday and Thursday. 2 mg all other days.       Allergies  Allergen Reactions  . Acetaminophen Other (See Comments)    Liver transplant recipient   . Codeine Itching      The results of significant diagnostics from this hospitalization (including imaging, microbiology, ancillary and laboratory) are listed below for reference.    Significant Diagnostic Studies: No results found.  Microbiology: Recent Results (from the past 240 hour(s))  MRSA PCR SCREENING     Status: None   Collection Time    07/03/13  8:21 PM      Result Value Ref Range Status   MRSA by PCR NEGATIVE  NEGATIVE Final   Comment:            The GeneXpert MRSA Assay (FDA     approved for NASAL specimens     only), is one component of a     comprehensive MRSA colonization     surveillance program. It is not     intended to diagnose MRSA     infection nor to guide or     monitor treatment for     MRSA infections.  CULTURE, BLOOD (ROUTINE X 2)     Status: None   Collection Time    07/04/13 12:20 AM      Result Value Ref Range Status   Specimen Description BLOOD LEFT HAND   Final   Special Requests BOTTLES DRAWN AEROBIC ONLY 1CC   Final   Culture  Setup Time     Final   Value: 07/04/2013 09:10     Performed at [facility]   Culture     Final   Value:        BLOOD CULTURE RECEIVED NO GROWTH TO DATE CULTURE WILL BE HELD FOR 5 DAYS BEFORE ISSUING A FINAL NEGATIVE REPORT     Performed at [facility]   Report Status PENDING   Incomplete  CULTURE, BLOOD (ROUTINE X 2)  Status: None   Collection Time    07/04/13 12:24 AM      Result Value Ref Range Status   Specimen Description BLOOD RIGHT HAND   Final   Special Requests BOTTLES DRAWN AEROBIC ONLY 1CC   Final   Culture  Setup Time     Final   Value: 07/04/2013 09:09     Performed at [facility]   Culture     Final   Value:        BLOOD CULTURE RECEIVED NO GROWTH TO DATE CULTURE WILL BE HELD FOR 5 DAYS BEFORE ISSUING A  FINAL NEGATIVE REPORT     Performed at [facility]   Report Status PENDING   Incomplete     Labs: Basic Metabolic Panel:  Recent Labs Lab 07/03/13 2200 07/04/13 0711 07/05/13 0535 07/06/13 0540 07/07/13 1200  NA 134* 134* 131* 141 137  K 3.9 4.4 4.6 4.2 3.4*  CL 93* 93* 90* 99 96  CO2 24 23 21 29 25   GLUCOSE 338* 277* 170* 153* 113*  BUN 25* 30* 45* 20 34*  CREATININE 6.21* 6.90* 8.12* 5.17* 7.42*  CALCIUM 8.5 8.3* 8.3* 8.3* 8.6  MG 2.0  --   --   --   --   PHOS 5.9*  --   --  4.6 5.8*   Liver Function Tests:  Recent Labs Lab 07/03/13 2200 07/04/13 0711 07/06/13 0540 07/07/13 1200  AST 21 17  --   --   ALT 11 9  --   --   ALKPHOS 80 70  --   --   BILITOT 0.7 0.6  --   --   PROT 7.3 6.5  --   --   ALBUMIN 3.0* 2.7* 2.7* 2.8*   No results found for this basename: LIPASE, AMYLASE,  in the last 168 hours No results found for this basename: AMMONIA,  in the last 168 hours CBC:  Recent Labs Lab 07/03/13 2200 07/04/13 0711 07/05/13 0535 07/06/13 0540 07/07/13 1200  WBC 5.1 6.4 10.3 6.2 5.4  NEUTROABS 4.5 5.4  --   --   --   HGB 11.6* 10.7* 10.9* 10.7* 10.9*  HCT 35.4* 33.9* 33.9* 33.8* 32.9*  MCV 94.7 95.8 95.5 95.8 93.7  PLT 171 145* 172 145* 157   Cardiac Enzymes:  Recent Labs Lab 07/03/13 2200 07/04/13 0238 07/04/13 0848  TROPONINI <0.30 <0.30 <0.30   BNP: BNP (last 3 results)  Recent Labs  07/20/12 0154 08/30/12 0758 09/01/12 0555  PROBNP 42149.0* 41152.0* 32077.0*   CBG:  Recent Labs Lab 07/06/13 0739 07/06/13 1308 07/06/13 1642 07/06/13 2154 07/07/13 0727  GLUCAP 145* 127* 133* 192* 157*       Signed:  Yitzchok Carriger C Emmersen Garraway  [facility] 07/07/2013, 3:53 PM

## 2013-07-07 NOTE — Progress Notes (Signed)
Subjective:  No cos,? for hd today then dc/ NO Smoking past 4 days!   Objective Vital signs in last 24 hours: Filed Vitals:   07/06/13 1040 07/06/13 1600 07/06/13 2155 07/07/13 0512  BP: 146/80 153/44 171/70 156/67  Pulse: 68 66 66 66  Temp: 97.9 F (36.6 C) 97.6 F (36.4 C) 98.2 F (36.8 C) 98.3 F (36.8 C)  TempSrc: Oral Oral Oral Oral  Resp: 22 24 22 20   Height:      Weight:   73.7 kg (162 lb 7.7 oz)   SpO2: 100% 100% 96% 94%   Weight change: -1.4 kg (-3 lb 1.4 oz)  Physical Exam: General: alert , NAD. Pleasant  Heart: RRR, 2/6 sem lsb, no rub or gallop  Lungs: Decr BS R base and improved from admit Abdomen: bs pos. Soft, NT, ND  Extremities: no pedal edema  Dialysis Access: Pos. Bruit L fem Avgg   Dialysis Orders: Center: [facility] on TTS .  EDW 72.5 HD Bath 2.0k. 2.0 ca Time 4 Heparin no . Access L fem avgg BFR 400 DFR 800  Hect. 2 mcg IV/HD Aranesp 63mcg weekly Units IV/HD Venofer 50mg  wk   Problem/Plan:  1. Chest pain/sob withVolume overload and Pleural Effusion=Symp better after HD  /needing lower edw at dc / yesterday wt 73.7=  uf today on hd 3 to 3.5 fu wt and bps/ Card fu as op 2. ESRD - HD TTS on schedule 3. Hypertension/volume - stable bp now Needs volume off with hd today nd lower edw 4. Anemia - ESA . Weekly venofer 10.7 hgb 5. 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 AVR/CAD/CABG- On coumadin/ card seeing/ CE neg. 7. Liver transplant- Meds  Ernest Haber, PA-C [facility] Beeper [phone] 07/07/2013,8:06 AM  LOS: 4 days   Labs: Basic Metabolic Panel:  Recent Labs Lab 07/03/13 2200 07/04/13 0711 07/05/13 0535 07/06/13 0540  NA 134* 134* 131* 141  K 3.9 4.4 4.6 4.2  CL 93* 93* 90* 99  CO2 24 23 21 29   GLUCOSE 338* 277* 170* 153*  BUN 25* 30* 45* 20  CREATININE 6.21* 6.90* 8.12* 5.17*  CALCIUM 8.5 8.3* 8.3* 8.3*  PHOS 5.9*  --   --  4.6    Liver Function Tests:  Recent Labs Lab 07/03/13 2200 07/04/13 0711 07/06/13 0540  AST 21 17  --   ALT 11 9  --   ALKPHOS 80 70  --   BILITOT 0.7 0.6  --   PROT 7.3 6.5  --   ALBUMIN 3.0* 2.7* 2.7*   CBC:  Recent Labs Lab 07/03/13 2200 07/04/13 0711 07/05/13 0535 07/06/13 0540  WBC 5.1 6.4 10.3 6.2  NEUTROABS 4.5 5.4  --   --   HGB 11.6* 10.7* 10.9* 10.7*  HCT 35.4* 33.9* 33.9* 33.8*  MCV 94.7 95.8 95.5 95.8  PLT 171 145* 172 145*   Cardiac Enzymes:  Recent Labs Lab 07/03/13 2200 07/04/13 0238 07/04/13 0848  TROPONINI <0.30 <0.30 <0.30   CBG:  Recent Labs Lab 07/06/13 0739 07/06/13 1308 07/06/13 1642 07/06/13 2154 07/07/13 0727  GLUCAP 145* 127* 133* 192* 157*    Studies/Results: No results found. Medications:   . amLODipine  5 mg Oral Daily  . aspirin EC  81 mg Oral Daily  . cinacalcet  30 mg Oral Daily  . darbepoetin (ARANESP) injection - DIALYSIS  25 mcg Intravenous Q Tue-HD  .  doxercalciferol  2 mcg Intravenous Q T,Th,Sa-HD  . DULoxetine  60 mg Oral QHS  . ferric gluconate (FERRLECIT/NULECIT) IV  62.5 mg Intravenous Q Thu-HD  . gabapentin  300 mg Oral QHS  . insulin aspart  0-5 Units Subcutaneous QHS  . insulin aspart  0-9 Units Subcutaneous TID WC  . insulin detemir  5 Units Subcutaneous QHS  . levothyroxine  125 mcg Oral QAC breakfast  . lidocaine  1 patch Transdermal Q24H  . LORazepam  0.5 mg Oral QHS  . metoprolol  50 mg Oral BID  . sodium chloride  3 mL Intravenous Q12H  . tacrolimus  5 mg Oral BID  . Warfarin - Pharmacist Dosing Inpatient   Does not apply [phone]

## 2013-07-10 DIAGNOSIS — N186 End stage renal disease: Secondary | ICD-10-CM | POA: Diagnosis not present

## 2013-07-10 LAB — CULTURE, BLOOD (ROUTINE X 2)
CULTURE: NO GROWTH
Culture: NO GROWTH

## 2013-07-12 DIAGNOSIS — E1165 Type 2 diabetes mellitus with hyperglycemia: Secondary | ICD-10-CM | POA: Diagnosis not present

## 2013-07-12 DIAGNOSIS — D631 Anemia in chronic kidney disease: Secondary | ICD-10-CM | POA: Diagnosis not present

## 2013-07-12 DIAGNOSIS — IMO0002 Reserved for concepts with insufficient information to code with codable children: Secondary | ICD-10-CM | POA: Diagnosis not present

## 2013-07-12 DIAGNOSIS — N186 End stage renal disease: Secondary | ICD-10-CM | POA: Diagnosis not present

## 2013-07-12 DIAGNOSIS — N2581 Secondary hyperparathyroidism of renal origin: Secondary | ICD-10-CM | POA: Diagnosis not present

## 2013-07-12 DIAGNOSIS — D509 Iron deficiency anemia, unspecified: Secondary | ICD-10-CM | POA: Diagnosis not present

## 2013-07-21 ENCOUNTER — Ambulatory Visit (INDEPENDENT_AMBULATORY_CARE_PROVIDER_SITE_OTHER): Payer: Medicare Other | Admitting: Cardiovascular Disease

## 2013-07-21 ENCOUNTER — Encounter: Payer: Self-pay | Admitting: Cardiovascular Disease

## 2013-07-21 VITALS — BP 172/68 | HR 75 | Ht 64.5 in | Wt 157.0 lb

## 2013-07-21 DIAGNOSIS — I2510 Atherosclerotic heart disease of native coronary artery without angina pectoris: Secondary | ICD-10-CM | POA: Diagnosis not present

## 2013-07-21 DIAGNOSIS — R079 Chest pain, unspecified: Secondary | ICD-10-CM | POA: Diagnosis not present

## 2013-07-21 LAB — POCT INR: INR: 3.3

## 2013-07-21 MED ORDER — AMLODIPINE BESYLATE 10 MG PO TABS: 10.0000 mg | ORAL_TABLET | Freq: Every day | ORAL | Status: DC

## 2013-07-21 MED ORDER — HYDRALAZINE HCL 25 MG PO TABS: 25.0000 mg | ORAL_TABLET | Freq: Three times a day (TID) | ORAL | Status: DC

## 2013-07-21 NOTE — Assessment & Plan Note (Signed)
Status post coronary bypass grafting x1 and aortic valve replacement by Dr. Tharon Aquas Trigt 03/15/13 with a 21 mm mechanical [employer] supra-annular Top-Hat valve.she is on Coumadin anticoagulation. She was recently admitted with shortness of breath and atypical chest pain. She was found to have pleural effusions and underwent hemodialysis she is on chronically. She does complain of some orthopnea.

## 2013-07-21 NOTE — Progress Notes (Signed)
07/21/2013 [name]   25-Oct-1955  [id number]  Primary Physician Charlynn Court, NP Primary Cardiologist: [name] MD Renae Gloss   HPI:  [name] is a 58 year old divorced African American female mother of 2 children patient of Dr. Nona [name] is. She seemed to see me back post hospital discharge where she was in for 4 days at the end of May for shortness of breath and atypical chest pain. She has a history of CAD and valvular heart disease. She had severe aortic stenosis a cath, Dr. Ellouise Newer 03/10/13 with a valve area 0.8 cm and a 50% ostial LAD. She underwent aortic valve replacement with 21 mm mechanical CarboMedics supra-annular Top-Hat valve as was a LIMA to her LAD. Her other problems include history of hypertension, continued tobacco abuse and COPD, end-stage renal disease on hemodialysis. She has hepatitis C status post liver transplant in 2011 at [facility]. Since discharge she still has had episodes of orthopnea.   Current Outpatient Prescriptions  Medication Sig Dispense Refill  . albuterol (PROVENTIL,VENTOLIN) 90 MCG/ACT inhaler Inhale 1-2 puffs into the lungs every 6 (six) hours as needed for wheezing or shortness of breath.       [name] amLODipine (NORVASC) 10 MG tablet Take 1 tablet (10 mg total) by mouth daily.  30 tablet  6  . aspirin EC 81 MG tablet Take 81 mg by mouth daily.        . calcium carbonate (TUMS - DOSED IN MG ELEMENTAL CALCIUM) 500 MG chewable tablet Chew 1 tablet by mouth daily.      . cinacalcet (SENSIPAR) 30 MG tablet Take 30 mg by mouth daily.      . DULoxetine (CYMBALTA) 60 MG capsule Take 60 mg by mouth at bedtime.       . gabapentin (NEURONTIN) 300 MG capsule Take 300 mg by mouth at bedtime.       . insulin detemir (LEVEMIR) 100 UNIT/ML injection Inject 0.05 mLs (5 Units total) into the skin at bedtime. On sliding scale: if over 150, take 2 units; if over 300, take 3 units; etc.  10 mL  11  . levothyroxine (SYNTHROID,  LEVOTHROID) 150 MCG tablet Take 150 mcg by mouth daily before breakfast.      . lidocaine (LIDODERM) 5 % Place 1 patch onto the skin daily. Remove & Discard patch within 12 hours or as directed by MD  30 patch  0  . LORazepam (ATIVAN) 0.5 MG tablet Take 0.5 mg by mouth at bedtime as needed for sleep.       . metoprolol (LOPRESSOR) 50 MG tablet Take 1 tablet (50 mg total) by mouth 2 (two) times daily.  60 tablet  11  . Multiple Vitamin (MULTIVITAMIN WITH MINERALS) TABS tablet Take 1 tablet by mouth at bedtime.       . promethazine (PHENERGAN) 12.5 MG tablet Take 12.5 mg by mouth every 4 (four) hours as needed for nausea.       [name] tacrolimus (PROGRAF) 1 MG capsule Take 5 mg by mouth 2 (two) times daily.       . traMADol (ULTRAM) 50 MG tablet Take 50 mg by mouth every 12 (twelve) hours as needed.       . VELPHORO 500 MG chewable tablet Chew 1 tablet by mouth 3 (three) times daily with meals.       . warfarin (COUMADIN) 2 MG tablet Take 2 mg by mouth daily at 6 PM. 4mg  on  Sunday, Tuesday and Thursday. 2 mg all other days.      . hydrALAZINE (APRESOLINE) 25 MG tablet Take 1 tablet (25 mg total) by mouth 3 (three) times daily.  270 tablet  3   No current facility-administered medications for this visit.    Allergies  Allergen Reactions  . Acetaminophen Other (See Comments)    Liver transplant recipient   . Codeine Itching  . Morphine Itching    History   Social History  . Marital Status: Divorced    Spouse Name: N/A    Number of Children: N/A  . Years of Education: N/A   Occupational History  . Not on file.   Social History Main Topics  . Smoking status: Former Smoker -- 0.75 packs/day for 40 years    Types: Cigarettes    Quit date: 03/13/2013  . Smokeless tobacco: Never Used     Comment: still smokng 1 or 2 a day  . Alcohol Use: Yes     Comment: 08/30/2012 "last drink was at a wedding July, 2014; had a small glass of wine; never had problems w/alcohol"  . Drug Use: No  . Sexual  Activity: Not Currently   Other Topics Concern  . Not on file   Social History Narrative  . No narrative on file     Review of Systems: General: negative for chills, fever, night sweats or weight changes.  Cardiovascular: negative for chest pain, dyspnea on exertion, edema, orthopnea, palpitations, paroxysmal nocturnal dyspnea or shortness of breath Dermatological: negative for rash Respiratory: negative for cough or wheezing Urologic: negative for hematuria Abdominal: negative for nausea, vomiting, diarrhea, bright red blood per rectum, melena, or hematemesis Neurologic: negative for visual changes, syncope, or dizziness All other systems reviewed and are otherwise negative except as noted above.    Blood pressure 172/68, pulse 75, height 5' 4.5" (1.638 m), weight 157 lb (71.215 kg).  General appearance: alert and no distress Neck: no adenopathy, no JVD, supple, symmetrical, trachea midline, thyroid not enlarged, symmetric, no tenderness/mass/nodules and bilateral carotid bruits Lungs: clear to auscultation bilaterally Heart: crisp aortic prosthetic valve sounds with a soft output chart number Extremities: extremities normal, atraumatic, no cyanosis or edema  EKG not performed today  ASSESSMENT AND PLAN:   S/P mechanical AVR Feb 2015 Status post coronary bypass grafting x1 and aortic valve replacement by Dr. Tharon Aquas Trigt 03/15/13 with a 21 mm mechanical Car[employer] supra-annular Top-Hat valve.she is on Coumadin anticoagulation. She was recently admitted with shortness of breath and atypical chest pain. She was found to have pleural effusions and underwent hemodialysis she is on chronically. She does complain of some orthopnea.  HTN- LVH, EF45- 50%. grade 2 diastolic dysf. echo 3/90/30 History of hypertension on multiple antihypertensive medications her blood pressure today is elevated as well today. I'm going to increase her amlodipine 5 mg a day and add hydralazine 25 mg by  mouth 3 times a day.      [name] MD FACP,FACC,FAHA, [facility] 07/21/2013 5:14 PM

## 2013-07-21 NOTE — Assessment & Plan Note (Signed)
History of hypertension on multiple antihypertensive medications her blood pressure today is elevated as well today. I'm going to increase her amlodipine 5 mg a day and add hydralazine 25 mg by mouth 3 times a day.

## 2013-07-21 NOTE — Patient Instructions (Signed)
We request that you follow-up in: 1 month with an extender and in 3 months with Dr Dow Adolph will receive a reminder letter in the mail two months in advance. If you don't receive a letter, please call our office to schedule the follow-up appointment.   Increase Amlodipine to 10mg  daily  Start Hydralazine 25mg  three times a day

## 2013-07-28 ENCOUNTER — Ambulatory Visit (INDEPENDENT_AMBULATORY_CARE_PROVIDER_SITE_OTHER): Payer: Medicare Other | Admitting: Pharmacist Clinician (PhC)/ Clinical Pharmacy Specialist

## 2013-07-28 DIAGNOSIS — Z954 Presence of other heart-valve replacement: Secondary | ICD-10-CM

## 2013-07-28 DIAGNOSIS — Z7901 Long term (current) use of anticoagulants: Secondary | ICD-10-CM

## 2013-07-28 DIAGNOSIS — Z952 Presence of prosthetic heart valve: Secondary | ICD-10-CM

## 2013-08-02 DIAGNOSIS — I359 Nonrheumatic aortic valve disorder, unspecified: Secondary | ICD-10-CM | POA: Diagnosis not present

## 2013-08-02 LAB — PROTIME-INR: INR: 2.1 — AB (ref 0.9–1.1)

## 2013-08-03 ENCOUNTER — Ambulatory Visit (INDEPENDENT_AMBULATORY_CARE_PROVIDER_SITE_OTHER): Payer: Medicare Other | Admitting: Pharmacist Clinician (PhC)/ Clinical Pharmacy Specialist

## 2013-08-03 DIAGNOSIS — Z952 Presence of prosthetic heart valve: Secondary | ICD-10-CM

## 2013-08-03 DIAGNOSIS — Z7901 Long term (current) use of anticoagulants: Secondary | ICD-10-CM

## 2013-08-03 DIAGNOSIS — Z954 Presence of other heart-valve replacement: Secondary | ICD-10-CM

## 2013-08-08 DIAGNOSIS — F17200 Nicotine dependence, unspecified, uncomplicated: Secondary | ICD-10-CM | POA: Diagnosis not present

## 2013-08-08 DIAGNOSIS — G4700 Insomnia, unspecified: Secondary | ICD-10-CM | POA: Diagnosis not present

## 2013-08-08 DIAGNOSIS — F064 Anxiety disorder due to known physiological condition: Secondary | ICD-10-CM | POA: Diagnosis not present

## 2013-08-08 DIAGNOSIS — I10 Essential (primary) hypertension: Secondary | ICD-10-CM | POA: Diagnosis not present

## 2013-08-08 DIAGNOSIS — N185 Chronic kidney disease, stage 5: Secondary | ICD-10-CM | POA: Diagnosis not present

## 2013-08-08 DIAGNOSIS — E1129 Type 2 diabetes mellitus with other diabetic kidney complication: Secondary | ICD-10-CM | POA: Diagnosis not present

## 2013-08-09 DIAGNOSIS — N186 End stage renal disease: Secondary | ICD-10-CM | POA: Diagnosis not present

## 2013-08-11 DIAGNOSIS — E1165 Type 2 diabetes mellitus with hyperglycemia: Secondary | ICD-10-CM | POA: Diagnosis not present

## 2013-08-11 DIAGNOSIS — IMO0002 Reserved for concepts with insufficient information to code with codable children: Secondary | ICD-10-CM | POA: Diagnosis not present

## 2013-08-11 DIAGNOSIS — D631 Anemia in chronic kidney disease: Secondary | ICD-10-CM | POA: Diagnosis not present

## 2013-08-11 DIAGNOSIS — N2581 Secondary hyperparathyroidism of renal origin: Secondary | ICD-10-CM | POA: Diagnosis not present

## 2013-08-11 DIAGNOSIS — N186 End stage renal disease: Secondary | ICD-10-CM | POA: Diagnosis not present

## 2013-08-11 DIAGNOSIS — D509 Iron deficiency anemia, unspecified: Secondary | ICD-10-CM | POA: Diagnosis not present

## 2013-08-19 DIAGNOSIS — N952 Postmenopausal atrophic vaginitis: Secondary | ICD-10-CM | POA: Diagnosis not present

## 2013-08-19 DIAGNOSIS — R109 Unspecified abdominal pain: Secondary | ICD-10-CM | POA: Diagnosis not present

## 2013-08-19 DIAGNOSIS — M545 Low back pain, unspecified: Secondary | ICD-10-CM | POA: Diagnosis not present

## 2013-08-26 ENCOUNTER — Ambulatory Visit: Payer: Medicare Other | Admitting: Cardiology

## 2013-09-01 DIAGNOSIS — E1165 Type 2 diabetes mellitus with hyperglycemia: Secondary | ICD-10-CM | POA: Diagnosis not present

## 2013-09-01 DIAGNOSIS — I359 Nonrheumatic aortic valve disorder, unspecified: Secondary | ICD-10-CM | POA: Diagnosis not present

## 2013-09-01 DIAGNOSIS — IMO0002 Reserved for concepts with insufficient information to code with codable children: Secondary | ICD-10-CM | POA: Diagnosis not present

## 2013-09-01 LAB — PROTIME-INR: INR: 3.7 — AB (ref 0.9–1.1)

## 2013-09-02 ENCOUNTER — Telehealth: Payer: Self-pay | Admitting: Pharmacist Clinician (PhC)/ Clinical Pharmacy Specialist

## 2013-09-02 ENCOUNTER — Ambulatory Visit (INDEPENDENT_AMBULATORY_CARE_PROVIDER_SITE_OTHER): Payer: Medicare Other | Admitting: Pharmacist Clinician (PhC)/ Clinical Pharmacy Specialist

## 2013-09-02 DIAGNOSIS — Z952 Presence of prosthetic heart valve: Secondary | ICD-10-CM

## 2013-09-02 DIAGNOSIS — Z954 Presence of other heart-valve replacement: Secondary | ICD-10-CM

## 2013-09-02 DIAGNOSIS — Z7901 Long term (current) use of anticoagulants: Secondary | ICD-10-CM

## 2013-09-02 NOTE — Telephone Encounter (Signed)
Pt called, states fell on back step yesterday, scraped leg and it won't stop bleeding.  Advised her to put pressure bandage on and keep elevated, if continues to bleed she should go to Urgent Care or ER for treatment.  Pt voiced understanding

## 2013-09-09 DIAGNOSIS — N186 End stage renal disease: Secondary | ICD-10-CM | POA: Diagnosis not present

## 2013-09-10 DIAGNOSIS — E1165 Type 2 diabetes mellitus with hyperglycemia: Secondary | ICD-10-CM | POA: Diagnosis not present

## 2013-09-10 DIAGNOSIS — IMO0002 Reserved for concepts with insufficient information to code with codable children: Secondary | ICD-10-CM | POA: Diagnosis not present

## 2013-09-10 DIAGNOSIS — N186 End stage renal disease: Secondary | ICD-10-CM | POA: Diagnosis not present

## 2013-09-10 DIAGNOSIS — N2581 Secondary hyperparathyroidism of renal origin: Secondary | ICD-10-CM | POA: Diagnosis not present

## 2013-09-15 DIAGNOSIS — I359 Nonrheumatic aortic valve disorder, unspecified: Secondary | ICD-10-CM | POA: Diagnosis not present

## 2013-09-15 LAB — PROTIME-INR: INR: 1.5 — AB (ref 0.9–1.1)

## 2013-09-19 ENCOUNTER — Ambulatory Visit (INDEPENDENT_AMBULATORY_CARE_PROVIDER_SITE_OTHER): Payer: Medicare Other | Admitting: Pharmacist Clinician (PhC)/ Clinical Pharmacy Specialist

## 2013-09-19 DIAGNOSIS — Z952 Presence of prosthetic heart valve: Secondary | ICD-10-CM

## 2013-09-19 DIAGNOSIS — Z954 Presence of other heart-valve replacement: Secondary | ICD-10-CM

## 2013-09-19 DIAGNOSIS — Z7901 Long term (current) use of anticoagulants: Secondary | ICD-10-CM

## 2013-09-29 DIAGNOSIS — I359 Nonrheumatic aortic valve disorder, unspecified: Secondary | ICD-10-CM | POA: Diagnosis not present

## 2013-09-29 LAB — PROTIME-INR: INR: 1.7 — AB (ref 0.9–1.1)

## 2013-09-30 ENCOUNTER — Ambulatory Visit (INDEPENDENT_AMBULATORY_CARE_PROVIDER_SITE_OTHER): Payer: Medicare Other | Admitting: Pharmacist Clinician (PhC)/ Clinical Pharmacy Specialist

## 2013-09-30 DIAGNOSIS — Z954 Presence of other heart-valve replacement: Secondary | ICD-10-CM

## 2013-09-30 DIAGNOSIS — Z7901 Long term (current) use of anticoagulants: Secondary | ICD-10-CM

## 2013-09-30 DIAGNOSIS — Z952 Presence of prosthetic heart valve: Secondary | ICD-10-CM

## 2013-10-03 DIAGNOSIS — Q619 Cystic kidney disease, unspecified: Secondary | ICD-10-CM | POA: Diagnosis not present

## 2013-10-03 DIAGNOSIS — Z944 Liver transplant status: Secondary | ICD-10-CM | POA: Diagnosis not present

## 2013-10-03 DIAGNOSIS — N281 Cyst of kidney, acquired: Secondary | ICD-10-CM | POA: Diagnosis not present

## 2013-10-03 DIAGNOSIS — K7689 Other specified diseases of liver: Secondary | ICD-10-CM | POA: Diagnosis not present

## 2013-10-06 DIAGNOSIS — I359 Nonrheumatic aortic valve disorder, unspecified: Secondary | ICD-10-CM | POA: Diagnosis not present

## 2013-10-06 LAB — PROTIME-INR: INR: 1.2 — AB (ref 0.9–1.1)

## 2013-10-07 ENCOUNTER — Ambulatory Visit (INDEPENDENT_AMBULATORY_CARE_PROVIDER_SITE_OTHER): Payer: Medicare Other | Admitting: Pharmacist Clinician (PhC)/ Clinical Pharmacy Specialist

## 2013-10-07 DIAGNOSIS — Z7901 Long term (current) use of anticoagulants: Secondary | ICD-10-CM

## 2013-10-07 DIAGNOSIS — Z954 Presence of other heart-valve replacement: Secondary | ICD-10-CM

## 2013-10-07 DIAGNOSIS — Z952 Presence of prosthetic heart valve: Secondary | ICD-10-CM

## 2013-10-10 DIAGNOSIS — N186 End stage renal disease: Secondary | ICD-10-CM | POA: Diagnosis not present

## 2013-10-11 DIAGNOSIS — E1165 Type 2 diabetes mellitus with hyperglycemia: Secondary | ICD-10-CM | POA: Diagnosis not present

## 2013-10-11 DIAGNOSIS — IMO0002 Reserved for concepts with insufficient information to code with codable children: Secondary | ICD-10-CM | POA: Diagnosis not present

## 2013-10-11 DIAGNOSIS — N186 End stage renal disease: Secondary | ICD-10-CM | POA: Diagnosis not present

## 2013-10-11 DIAGNOSIS — E118 Type 2 diabetes mellitus with unspecified complications: Secondary | ICD-10-CM | POA: Diagnosis not present

## 2013-10-11 DIAGNOSIS — D631 Anemia in chronic kidney disease: Secondary | ICD-10-CM | POA: Diagnosis not present

## 2013-10-11 DIAGNOSIS — N2581 Secondary hyperparathyroidism of renal origin: Secondary | ICD-10-CM | POA: Diagnosis not present

## 2013-10-13 DIAGNOSIS — E118 Type 2 diabetes mellitus with unspecified complications: Secondary | ICD-10-CM | POA: Diagnosis not present

## 2013-10-13 DIAGNOSIS — N186 End stage renal disease: Secondary | ICD-10-CM | POA: Diagnosis not present

## 2013-10-13 DIAGNOSIS — E1165 Type 2 diabetes mellitus with hyperglycemia: Secondary | ICD-10-CM | POA: Diagnosis not present

## 2013-10-13 DIAGNOSIS — N039 Chronic nephritic syndrome with unspecified morphologic changes: Secondary | ICD-10-CM | POA: Diagnosis not present

## 2013-10-13 DIAGNOSIS — IMO0002 Reserved for concepts with insufficient information to code with codable children: Secondary | ICD-10-CM | POA: Diagnosis not present

## 2013-10-13 DIAGNOSIS — N2581 Secondary hyperparathyroidism of renal origin: Secondary | ICD-10-CM | POA: Diagnosis not present

## 2013-10-13 DIAGNOSIS — D631 Anemia in chronic kidney disease: Secondary | ICD-10-CM | POA: Diagnosis not present

## 2013-10-15 DIAGNOSIS — Z79899 Other long term (current) drug therapy: Secondary | ICD-10-CM | POA: Diagnosis not present

## 2013-10-15 DIAGNOSIS — N186 End stage renal disease: Secondary | ICD-10-CM | POA: Diagnosis not present

## 2013-10-15 DIAGNOSIS — D631 Anemia in chronic kidney disease: Secondary | ICD-10-CM | POA: Diagnosis not present

## 2013-10-15 DIAGNOSIS — K219 Gastro-esophageal reflux disease without esophagitis: Secondary | ICD-10-CM | POA: Diagnosis not present

## 2013-10-15 DIAGNOSIS — T819XXA Unspecified complication of procedure, initial encounter: Secondary | ICD-10-CM | POA: Diagnosis not present

## 2013-10-15 DIAGNOSIS — IMO0002 Reserved for concepts with insufficient information to code with codable children: Secondary | ICD-10-CM | POA: Diagnosis not present

## 2013-10-15 DIAGNOSIS — M79609 Pain in unspecified limb: Secondary | ICD-10-CM | POA: Diagnosis not present

## 2013-10-15 DIAGNOSIS — N2581 Secondary hyperparathyroidism of renal origin: Secondary | ICD-10-CM | POA: Diagnosis not present

## 2013-10-15 DIAGNOSIS — E119 Type 2 diabetes mellitus without complications: Secondary | ICD-10-CM | POA: Diagnosis not present

## 2013-10-15 DIAGNOSIS — Z7901 Long term (current) use of anticoagulants: Secondary | ICD-10-CM | POA: Diagnosis not present

## 2013-10-15 DIAGNOSIS — E118 Type 2 diabetes mellitus with unspecified complications: Secondary | ICD-10-CM | POA: Diagnosis not present

## 2013-10-15 DIAGNOSIS — T8131XA Disruption of external operation (surgical) wound, not elsewhere classified, initial encounter: Secondary | ICD-10-CM | POA: Diagnosis not present

## 2013-10-15 DIAGNOSIS — E1165 Type 2 diabetes mellitus with hyperglycemia: Secondary | ICD-10-CM | POA: Diagnosis not present

## 2013-10-15 DIAGNOSIS — Y838 Other surgical procedures as the cause of abnormal reaction of the patient, or of later complication, without mention of misadventure at the time of the procedure: Secondary | ICD-10-CM | POA: Diagnosis not present

## 2013-10-15 DIAGNOSIS — E7800 Pure hypercholesterolemia, unspecified: Secondary | ICD-10-CM | POA: Diagnosis not present

## 2013-10-15 DIAGNOSIS — T82898A Other specified complication of vascular prosthetic devices, implants and grafts, initial encounter: Secondary | ICD-10-CM | POA: Diagnosis not present

## 2013-10-15 DIAGNOSIS — I10 Essential (primary) hypertension: Secondary | ICD-10-CM | POA: Diagnosis not present

## 2013-10-15 DIAGNOSIS — J449 Chronic obstructive pulmonary disease, unspecified: Secondary | ICD-10-CM | POA: Diagnosis not present

## 2013-10-15 DIAGNOSIS — Z794 Long term (current) use of insulin: Secondary | ICD-10-CM | POA: Diagnosis not present

## 2013-10-18 DIAGNOSIS — IMO0002 Reserved for concepts with insufficient information to code with codable children: Secondary | ICD-10-CM | POA: Diagnosis not present

## 2013-10-18 DIAGNOSIS — N186 End stage renal disease: Secondary | ICD-10-CM | POA: Diagnosis not present

## 2013-10-18 DIAGNOSIS — E118 Type 2 diabetes mellitus with unspecified complications: Secondary | ICD-10-CM | POA: Diagnosis not present

## 2013-10-18 DIAGNOSIS — N2581 Secondary hyperparathyroidism of renal origin: Secondary | ICD-10-CM | POA: Diagnosis not present

## 2013-10-18 DIAGNOSIS — I359 Nonrheumatic aortic valve disorder, unspecified: Secondary | ICD-10-CM | POA: Diagnosis not present

## 2013-10-18 DIAGNOSIS — E1165 Type 2 diabetes mellitus with hyperglycemia: Secondary | ICD-10-CM | POA: Diagnosis not present

## 2013-10-18 DIAGNOSIS — D631 Anemia in chronic kidney disease: Secondary | ICD-10-CM | POA: Diagnosis not present

## 2013-10-18 LAB — PROTIME-INR: INR: 2.2 — AB (ref 0.9–1.1)

## 2013-10-20 ENCOUNTER — Ambulatory Visit (INDEPENDENT_AMBULATORY_CARE_PROVIDER_SITE_OTHER): Payer: Medicare Other | Admitting: Pharmacist Clinician (PhC)/ Clinical Pharmacy Specialist

## 2013-10-20 DIAGNOSIS — D631 Anemia in chronic kidney disease: Secondary | ICD-10-CM | POA: Diagnosis not present

## 2013-10-20 DIAGNOSIS — IMO0002 Reserved for concepts with insufficient information to code with codable children: Secondary | ICD-10-CM | POA: Diagnosis not present

## 2013-10-20 DIAGNOSIS — E1165 Type 2 diabetes mellitus with hyperglycemia: Secondary | ICD-10-CM | POA: Diagnosis not present

## 2013-10-20 DIAGNOSIS — E118 Type 2 diabetes mellitus with unspecified complications: Secondary | ICD-10-CM | POA: Diagnosis not present

## 2013-10-20 DIAGNOSIS — Z954 Presence of other heart-valve replacement: Secondary | ICD-10-CM

## 2013-10-20 DIAGNOSIS — N2581 Secondary hyperparathyroidism of renal origin: Secondary | ICD-10-CM | POA: Diagnosis not present

## 2013-10-20 DIAGNOSIS — N039 Chronic nephritic syndrome with unspecified morphologic changes: Secondary | ICD-10-CM | POA: Diagnosis not present

## 2013-10-20 DIAGNOSIS — Z7901 Long term (current) use of anticoagulants: Secondary | ICD-10-CM

## 2013-10-20 DIAGNOSIS — Z952 Presence of prosthetic heart valve: Secondary | ICD-10-CM

## 2013-10-20 DIAGNOSIS — N186 End stage renal disease: Secondary | ICD-10-CM | POA: Diagnosis not present

## 2013-10-22 DIAGNOSIS — E1165 Type 2 diabetes mellitus with hyperglycemia: Secondary | ICD-10-CM | POA: Diagnosis not present

## 2013-10-22 DIAGNOSIS — N2581 Secondary hyperparathyroidism of renal origin: Secondary | ICD-10-CM | POA: Diagnosis not present

## 2013-10-22 DIAGNOSIS — IMO0002 Reserved for concepts with insufficient information to code with codable children: Secondary | ICD-10-CM | POA: Diagnosis not present

## 2013-10-22 DIAGNOSIS — N186 End stage renal disease: Secondary | ICD-10-CM | POA: Diagnosis not present

## 2013-10-22 DIAGNOSIS — E118 Type 2 diabetes mellitus with unspecified complications: Secondary | ICD-10-CM | POA: Diagnosis not present

## 2013-10-22 DIAGNOSIS — D631 Anemia in chronic kidney disease: Secondary | ICD-10-CM | POA: Diagnosis not present

## 2013-10-25 DIAGNOSIS — N2581 Secondary hyperparathyroidism of renal origin: Secondary | ICD-10-CM | POA: Diagnosis not present

## 2013-10-25 DIAGNOSIS — N186 End stage renal disease: Secondary | ICD-10-CM | POA: Diagnosis not present

## 2013-10-25 DIAGNOSIS — E1165 Type 2 diabetes mellitus with hyperglycemia: Secondary | ICD-10-CM | POA: Diagnosis not present

## 2013-10-25 DIAGNOSIS — D631 Anemia in chronic kidney disease: Secondary | ICD-10-CM | POA: Diagnosis not present

## 2013-10-25 DIAGNOSIS — E118 Type 2 diabetes mellitus with unspecified complications: Secondary | ICD-10-CM | POA: Diagnosis not present

## 2013-10-25 DIAGNOSIS — IMO0002 Reserved for concepts with insufficient information to code with codable children: Secondary | ICD-10-CM | POA: Diagnosis not present

## 2013-10-26 ENCOUNTER — Ambulatory Visit: Payer: Medicare Other | Admitting: Cardiovascular Disease

## 2013-10-26 ENCOUNTER — Ambulatory Visit: Payer: Medicare Other | Admitting: Pharmacist Clinician (PhC)/ Clinical Pharmacy Specialist

## 2013-10-27 DIAGNOSIS — E1165 Type 2 diabetes mellitus with hyperglycemia: Secondary | ICD-10-CM | POA: Diagnosis not present

## 2013-10-27 DIAGNOSIS — E118 Type 2 diabetes mellitus with unspecified complications: Secondary | ICD-10-CM | POA: Diagnosis not present

## 2013-10-27 DIAGNOSIS — N186 End stage renal disease: Secondary | ICD-10-CM | POA: Diagnosis not present

## 2013-10-27 DIAGNOSIS — D631 Anemia in chronic kidney disease: Secondary | ICD-10-CM | POA: Diagnosis not present

## 2013-10-27 DIAGNOSIS — N039 Chronic nephritic syndrome with unspecified morphologic changes: Secondary | ICD-10-CM | POA: Diagnosis not present

## 2013-10-27 DIAGNOSIS — N2581 Secondary hyperparathyroidism of renal origin: Secondary | ICD-10-CM | POA: Diagnosis not present

## 2013-10-27 DIAGNOSIS — IMO0002 Reserved for concepts with insufficient information to code with codable children: Secondary | ICD-10-CM | POA: Diagnosis not present

## 2013-10-29 DIAGNOSIS — D631 Anemia in chronic kidney disease: Secondary | ICD-10-CM | POA: Diagnosis not present

## 2013-10-29 DIAGNOSIS — IMO0002 Reserved for concepts with insufficient information to code with codable children: Secondary | ICD-10-CM | POA: Diagnosis not present

## 2013-10-29 DIAGNOSIS — N186 End stage renal disease: Secondary | ICD-10-CM | POA: Diagnosis not present

## 2013-10-29 DIAGNOSIS — E118 Type 2 diabetes mellitus with unspecified complications: Secondary | ICD-10-CM | POA: Diagnosis not present

## 2013-10-29 DIAGNOSIS — N2581 Secondary hyperparathyroidism of renal origin: Secondary | ICD-10-CM | POA: Diagnosis not present

## 2013-10-29 DIAGNOSIS — E1165 Type 2 diabetes mellitus with hyperglycemia: Secondary | ICD-10-CM | POA: Diagnosis not present

## 2013-11-01 DIAGNOSIS — E1165 Type 2 diabetes mellitus with hyperglycemia: Secondary | ICD-10-CM | POA: Diagnosis not present

## 2013-11-01 DIAGNOSIS — K769 Liver disease, unspecified: Secondary | ICD-10-CM | POA: Diagnosis not present

## 2013-11-01 DIAGNOSIS — R5383 Other fatigue: Secondary | ICD-10-CM | POA: Diagnosis not present

## 2013-11-01 DIAGNOSIS — N186 End stage renal disease: Secondary | ICD-10-CM | POA: Diagnosis not present

## 2013-11-01 DIAGNOSIS — R51 Headache: Secondary | ICD-10-CM | POA: Diagnosis not present

## 2013-11-01 DIAGNOSIS — E7800 Pure hypercholesterolemia, unspecified: Secondary | ICD-10-CM | POA: Diagnosis not present

## 2013-11-01 DIAGNOSIS — I10 Essential (primary) hypertension: Secondary | ICD-10-CM | POA: Diagnosis not present

## 2013-11-01 DIAGNOSIS — R4182 Altered mental status, unspecified: Secondary | ICD-10-CM | POA: Diagnosis not present

## 2013-11-01 DIAGNOSIS — E119 Type 2 diabetes mellitus without complications: Secondary | ICD-10-CM | POA: Diagnosis not present

## 2013-11-01 DIAGNOSIS — Z79899 Other long term (current) drug therapy: Secondary | ICD-10-CM | POA: Diagnosis not present

## 2013-11-01 DIAGNOSIS — Z794 Long term (current) use of insulin: Secondary | ICD-10-CM | POA: Diagnosis not present

## 2013-11-01 DIAGNOSIS — K219 Gastro-esophageal reflux disease without esophagitis: Secondary | ICD-10-CM | POA: Diagnosis not present

## 2013-11-01 DIAGNOSIS — J449 Chronic obstructive pulmonary disease, unspecified: Secondary | ICD-10-CM | POA: Diagnosis not present

## 2013-11-01 DIAGNOSIS — Z954 Presence of other heart-valve replacement: Secondary | ICD-10-CM | POA: Diagnosis not present

## 2013-11-01 DIAGNOSIS — Z992 Dependence on renal dialysis: Secondary | ICD-10-CM | POA: Diagnosis not present

## 2013-11-01 DIAGNOSIS — N269 Renal sclerosis, unspecified: Secondary | ICD-10-CM | POA: Diagnosis not present

## 2013-11-01 DIAGNOSIS — Z944 Liver transplant status: Secondary | ICD-10-CM | POA: Diagnosis not present

## 2013-11-01 DIAGNOSIS — N281 Cyst of kidney, acquired: Secondary | ICD-10-CM | POA: Diagnosis not present

## 2013-11-01 DIAGNOSIS — N2581 Secondary hyperparathyroidism of renal origin: Secondary | ICD-10-CM | POA: Diagnosis not present

## 2013-11-01 DIAGNOSIS — R5381 Other malaise: Secondary | ICD-10-CM | POA: Diagnosis not present

## 2013-11-01 DIAGNOSIS — E118 Type 2 diabetes mellitus with unspecified complications: Secondary | ICD-10-CM | POA: Diagnosis not present

## 2013-11-01 DIAGNOSIS — R1084 Generalized abdominal pain: Secondary | ICD-10-CM | POA: Diagnosis not present

## 2013-11-01 DIAGNOSIS — Z7901 Long term (current) use of anticoagulants: Secondary | ICD-10-CM | POA: Diagnosis not present

## 2013-11-01 DIAGNOSIS — D631 Anemia in chronic kidney disease: Secondary | ICD-10-CM | POA: Diagnosis not present

## 2013-11-01 DIAGNOSIS — IMO0002 Reserved for concepts with insufficient information to code with codable children: Secondary | ICD-10-CM | POA: Diagnosis not present

## 2013-11-01 DIAGNOSIS — K3184 Gastroparesis: Secondary | ICD-10-CM | POA: Diagnosis not present

## 2013-11-02 ENCOUNTER — Ambulatory Visit: Payer: Medicare Other | Admitting: Cardiovascular Disease

## 2013-11-03 ENCOUNTER — Emergency Department (HOSPITAL_COMMUNITY): Payer: Medicare Other

## 2013-11-03 ENCOUNTER — Emergency Department (HOSPITAL_COMMUNITY)
Admission: EM | Admit: 2013-11-03 | Discharge: 2013-11-03 | Disposition: A | Discharge status: Home / Self Care | Payer: Medicare Other | Attending: Emergency Medicine | Admitting: Emergency Medicine

## 2013-11-03 ENCOUNTER — Encounter (HOSPITAL_COMMUNITY): Payer: Self-pay | Admitting: Emergency Medicine

## 2013-11-03 DIAGNOSIS — R112 Nausea with vomiting, unspecified: Secondary | ICD-10-CM | POA: Diagnosis not present

## 2013-11-03 DIAGNOSIS — E039 Hypothyroidism, unspecified: Secondary | ICD-10-CM | POA: Diagnosis not present

## 2013-11-03 DIAGNOSIS — IMO0002 Reserved for concepts with insufficient information to code with codable children: Secondary | ICD-10-CM | POA: Diagnosis not present

## 2013-11-03 DIAGNOSIS — I120 Hypertensive chronic kidney disease with stage 5 chronic kidney disease or end stage renal disease: Secondary | ICD-10-CM | POA: Insufficient documentation

## 2013-11-03 DIAGNOSIS — Z8619 Personal history of other infectious and parasitic diseases: Secondary | ICD-10-CM | POA: Insufficient documentation

## 2013-11-03 DIAGNOSIS — K5909 Other constipation: Secondary | ICD-10-CM | POA: Insufficient documentation

## 2013-11-03 DIAGNOSIS — N186 End stage renal disease: Secondary | ICD-10-CM | POA: Diagnosis not present

## 2013-11-03 DIAGNOSIS — Z8701 Personal history of pneumonia (recurrent): Secondary | ICD-10-CM | POA: Diagnosis not present

## 2013-11-03 DIAGNOSIS — J45901 Unspecified asthma with (acute) exacerbation: Secondary | ICD-10-CM | POA: Diagnosis not present

## 2013-11-03 DIAGNOSIS — J441 Chronic obstructive pulmonary disease with (acute) exacerbation: Secondary | ICD-10-CM | POA: Diagnosis not present

## 2013-11-03 DIAGNOSIS — Z794 Long term (current) use of insulin: Secondary | ICD-10-CM | POA: Diagnosis not present

## 2013-11-03 DIAGNOSIS — K219 Gastro-esophageal reflux disease without esophagitis: Secondary | ICD-10-CM | POA: Insufficient documentation

## 2013-11-03 DIAGNOSIS — Z87891 Personal history of nicotine dependence: Secondary | ICD-10-CM | POA: Diagnosis not present

## 2013-11-03 DIAGNOSIS — R1084 Generalized abdominal pain: Secondary | ICD-10-CM | POA: Diagnosis not present

## 2013-11-03 DIAGNOSIS — I2510 Atherosclerotic heart disease of native coronary artery without angina pectoris: Secondary | ICD-10-CM | POA: Diagnosis not present

## 2013-11-03 DIAGNOSIS — R109 Unspecified abdominal pain: Secondary | ICD-10-CM | POA: Diagnosis not present

## 2013-11-03 DIAGNOSIS — Z9089 Acquired absence of other organs: Secondary | ICD-10-CM | POA: Insufficient documentation

## 2013-11-03 DIAGNOSIS — Z992 Dependence on renal dialysis: Secondary | ICD-10-CM | POA: Insufficient documentation

## 2013-11-03 DIAGNOSIS — E1165 Type 2 diabetes mellitus with hyperglycemia: Secondary | ICD-10-CM | POA: Diagnosis not present

## 2013-11-03 DIAGNOSIS — Z7901 Long term (current) use of anticoagulants: Secondary | ICD-10-CM | POA: Diagnosis not present

## 2013-11-03 DIAGNOSIS — M129 Arthropathy, unspecified: Secondary | ICD-10-CM | POA: Diagnosis not present

## 2013-11-03 DIAGNOSIS — D631 Anemia in chronic kidney disease: Secondary | ICD-10-CM | POA: Diagnosis not present

## 2013-11-03 DIAGNOSIS — G8929 Other chronic pain: Secondary | ICD-10-CM | POA: Insufficient documentation

## 2013-11-03 DIAGNOSIS — N2581 Secondary hyperparathyroidism of renal origin: Secondary | ICD-10-CM | POA: Diagnosis not present

## 2013-11-03 DIAGNOSIS — Z7982 Long term (current) use of aspirin: Secondary | ICD-10-CM | POA: Insufficient documentation

## 2013-11-03 DIAGNOSIS — Z944 Liver transplant status: Secondary | ICD-10-CM | POA: Diagnosis not present

## 2013-11-03 DIAGNOSIS — E119 Type 2 diabetes mellitus without complications: Secondary | ICD-10-CM | POA: Insufficient documentation

## 2013-11-03 DIAGNOSIS — Z79899 Other long term (current) drug therapy: Secondary | ICD-10-CM | POA: Insufficient documentation

## 2013-11-03 DIAGNOSIS — F3289 Other specified depressive episodes: Secondary | ICD-10-CM | POA: Insufficient documentation

## 2013-11-03 DIAGNOSIS — G709 Myoneural disorder, unspecified: Secondary | ICD-10-CM | POA: Diagnosis not present

## 2013-11-03 DIAGNOSIS — D649 Anemia, unspecified: Secondary | ICD-10-CM | POA: Diagnosis not present

## 2013-11-03 DIAGNOSIS — N039 Chronic nephritic syndrome with unspecified morphologic changes: Secondary | ICD-10-CM | POA: Diagnosis not present

## 2013-11-03 DIAGNOSIS — F411 Generalized anxiety disorder: Secondary | ICD-10-CM | POA: Insufficient documentation

## 2013-11-03 DIAGNOSIS — E118 Type 2 diabetes mellitus with unspecified complications: Secondary | ICD-10-CM | POA: Diagnosis not present

## 2013-11-03 DIAGNOSIS — E669 Obesity, unspecified: Secondary | ICD-10-CM | POA: Insufficient documentation

## 2013-11-03 DIAGNOSIS — G43909 Migraine, unspecified, not intractable, without status migrainosus: Secondary | ICD-10-CM | POA: Diagnosis not present

## 2013-11-03 DIAGNOSIS — K2990 Gastroduodenitis, unspecified, without bleeding: Secondary | ICD-10-CM | POA: Diagnosis not present

## 2013-11-03 DIAGNOSIS — K2970 Gastritis, unspecified, without bleeding: Secondary | ICD-10-CM | POA: Diagnosis not present

## 2013-11-03 DIAGNOSIS — F329 Major depressive disorder, single episode, unspecified: Secondary | ICD-10-CM | POA: Insufficient documentation

## 2013-11-03 DIAGNOSIS — R10817 Generalized abdominal tenderness: Secondary | ICD-10-CM | POA: Diagnosis not present

## 2013-11-03 DIAGNOSIS — I359 Nonrheumatic aortic valve disorder, unspecified: Secondary | ICD-10-CM | POA: Diagnosis not present

## 2013-11-03 DIAGNOSIS — Z951 Presence of aortocoronary bypass graft: Secondary | ICD-10-CM | POA: Insufficient documentation

## 2013-11-03 LAB — COMPREHENSIVE METABOLIC PANEL
ALT: 18 U/L (ref 0–35)
AST: 21 U/L (ref 0–37)
Albumin: 3 g/dL — ABNORMAL LOW (ref 3.5–5.2)
Alkaline Phosphatase: 79 U/L (ref 39–117)
Anion gap: 17 — ABNORMAL HIGH (ref 5–15)
BUN: 25 mg/dL — ABNORMAL HIGH (ref 6–23)
CO2: 26 mEq/L (ref 19–32)
Calcium: 7.9 mg/dL — ABNORMAL LOW (ref 8.4–10.5)
Chloride: 97 mEq/L (ref 96–112)
Creatinine, Ser: 5.95 mg/dL — ABNORMAL HIGH (ref 0.50–1.10)
GFR calc Af Amer: 8 mL/min — ABNORMAL LOW (ref >90)
GFR calc non Af Amer: 7 mL/min — ABNORMAL LOW (ref >90)
Glucose, Bld: 200 mg/dL — ABNORMAL HIGH (ref 70–99)
Potassium: 3.5 mEq/L — ABNORMAL LOW (ref 3.7–5.3)
Sodium: 140 mEq/L (ref 137–147)
Total Bilirubin: 0.3 mg/dL (ref 0.3–1.2)
Total Protein: 7.1 g/dL (ref 6.0–8.3)

## 2013-11-03 LAB — CBC WITH DIFFERENTIAL/PLATELET
Basophils Absolute: 0 10*3/uL (ref 0.0–0.1)
Basophils Relative: 0 % (ref 0–1)
Eosinophils Absolute: 0.1 10*3/uL (ref 0.0–0.7)
Eosinophils Relative: 2 % (ref 0–5)
HCT: 32 % — ABNORMAL LOW (ref 36.0–46.0)
Hemoglobin: 10.8 g/dL — ABNORMAL LOW (ref 12.0–15.0)
Lymphocytes Relative: 21 % (ref 12–46)
Lymphs Abs: 1.2 10*3/uL (ref 0.7–4.0)
MCH: 32 pg (ref 26.0–34.0)
MCHC: 33.8 g/dL (ref 30.0–36.0)
MCV: 94.7 fL (ref 78.0–100.0)
Monocytes Absolute: 0.6 10*3/uL (ref 0.1–1.0)
Monocytes Relative: 11 % (ref 3–12)
Neutro Abs: 3.8 10*3/uL (ref 1.7–7.7)
Neutrophils Relative %: 66 % (ref 43–77)
Platelets: 142 10*3/uL — ABNORMAL LOW (ref 150–400)
RBC: 3.38 MIL/uL — ABNORMAL LOW (ref 3.87–5.11)
RDW: 15.3 % (ref 11.5–15.5)
WBC: 5.7 10*3/uL (ref 4.0–10.5)

## 2013-11-03 LAB — PROTIME-INR
INR: 1.86 — AB (ref 0.00–1.49)
Prothrombin Time: 21.4 seconds — ABNORMAL HIGH (ref 11.6–15.2)

## 2013-11-03 LAB — I-STAT CG4 LACTIC ACID, ED: Lactic Acid, Venous: 1.67 mmol/L (ref 0.5–2.2)

## 2013-11-03 LAB — APTT: aPTT: 41 seconds — ABNORMAL HIGH (ref 24–37)

## 2013-11-03 LAB — LIPASE, BLOOD: Lipase: 17 U/L (ref 11–59)

## 2013-11-03 MED ORDER — IOHEXOL 300 MG/ML  SOLN: 50.0000 mL | Freq: Once | INTRAMUSCULAR | Status: DC | PRN

## 2013-11-03 MED ORDER — ONDANSETRON HCL 4 MG/2ML IJ SOLN
4.0000 mg | Freq: Once | INTRAMUSCULAR | Status: AC
  Administered 2013-11-03: 4 mg via INTRAVENOUS
  Filled 2013-11-03: qty 2

## 2013-11-03 MED ORDER — PROMETHAZINE HCL 25 MG/ML IJ SOLN
12.5000 mg | Freq: Once | INTRAMUSCULAR | Status: AC
  Administered 2013-11-03: 12.5 mg via INTRAVENOUS
  Filled 2013-11-03: qty 1

## 2013-11-03 MED ORDER — SODIUM CHLORIDE 0.9 % IV BOLUS (SEPSIS)
250.0000 mL | Freq: Once | INTRAVENOUS | Status: AC
  Administered 2013-11-03: 250 mL via INTRAVENOUS

## 2013-11-03 MED ORDER — IBUPROFEN 800 MG PO TABS: 800.0000 mg | ORAL_TABLET | Freq: Once | ORAL | Status: DC

## 2013-11-03 MED ORDER — PROMETHAZINE HCL 25 MG PO TABS: 25.0000 mg | ORAL_TABLET | Freq: Four times a day (QID) | ORAL | Status: DC | PRN

## 2013-11-03 MED ORDER — FENTANYL CITRATE 0.05 MG/ML IJ SOLN
100.0000 ug | Freq: Once | INTRAMUSCULAR | Status: AC
  Administered 2013-11-03: 100 ug via INTRAVENOUS
  Filled 2013-11-03: qty 2

## 2013-11-03 MED ORDER — HYDROMORPHONE HCL 1 MG/ML IJ SOLN
1.0000 mg | Freq: Once | INTRAMUSCULAR | Status: AC
  Administered 2013-11-03: 1 mg via INTRAVENOUS
  Filled 2013-11-03: qty 1

## 2013-11-03 NOTE — Progress Notes (Signed)
Deaccessed HD cath  @ left thigh;no bleeding at inner site noted; outer site bleeding noted,pressure applied by this Rn and ED nurse.

## 2013-11-03 NOTE — ED Notes (Signed)
MD at bedside. 

## 2013-11-03 NOTE — ED Provider Notes (Signed)
Care assumed from [facility], PA-C.  [name] is a 58 y.o. female presents from dialysis with nausea and vomiting with assocaited abdominal pain.  Pt has been on dialysis since 2011.  She has been afebrile and nontachycardic while in the ED.  Pt with no acute findings on her CT scan, but vomited after CT.  Pt given phenergan and reassessed without persistent vomiting   Physical Exam  BP 175/69  Pulse 77  Temp(Src) 97.4 F (36.3 C) (Oral)  Resp 18  SpO2 100%  Physical Exam  Face to face Exam:   General: Awake  HEENT: Atraumatic  Resp: Normal effort  Abd: Nondistended  Neuro:No focal weakness  Lymph: No adenopathy MSK: left leg dialysis catheter remains    ED Course  Procedures  MDM [name] presents with abd pain, N/V while on dialysis today. She was sent for further evaluation with a benign work-up.  Pt with 1 episode of emesis in the department, treated with phenergan.  Pt is stable for d/c if she passes her PO challenge.  Plan: PO trial and if pt does not vomit, she may be discharged home.  5:16 PM Pt is tolerating PO without difficulty.  She reports she feels much better at this time.  Pt now reports that she wants the dialysis catheter removed.    6:14 PM Dialysis catheter removed by IV team, pt will be d/c home.    BP 175/69  Pulse 77  Temp(Src) 97.4 F (36.3 C) (Oral)  Resp 18  SpO2 100%     ICD-9-CM ICD-10-CM  1. Generalized abdominal pain 789.07 R10.84  2. Non-intractable vomiting with nausea, vomiting of unspecified type 787.01 R11.2  3. End stage renal disease on dialysis-TTS 585.6 N18.6   V45.11 Z99.2  4. DIABETES MELLITUS, TYPE II 250.00 E11.9    Abigail Butts, PA-C 11/03/13 1946

## 2013-11-03 NOTE — ED Notes (Signed)
Per EMS, pt was at dialysis and did not complete dialysis treatment.  Started having abdominal pain there.  Pt has had n/v since Tuesday and dx with "stomach bug"  After initiating dialysis today, started having abdominal pain.  Pt has been on dialysis since 2011.  Vitals: 128 palpated, hr 70's

## 2013-11-03 NOTE — ED Provider Notes (Signed)
CSN: [id number]     Arrival date & time 11/03/13  1013 History   First MD Initiated Contact with Patient 11/03/13 1022     Chief Complaint  Patient presents with  . Abdominal Pain     (Consider location/radiation/quality/duration/timing/severity/associated sxs/prior Treatment) HPI Patient presents to the emergency department with abdominal pain, with nausea and vomiting has been ongoing since Tuesday.  The patient, states, that started having the abdominal pain after dialysis Tuesday, states, that the abdominal pain, has been constant since that time.  Patient, states nothing seems to make her condition, better and palpation makes the pain, worse.  Patient, states, that she didn't take any medications prior to arrival for her symptoms.  Patient denies chest pain, shortness of breath, diarrhea, weakness, dizziness, headache, blurred vision, back pain, neck pain, fever or syncope.  The patient, states, that nothing seems to make her condition, better and palpation makes the pain, worse Past Medical History  Diagnosis Date  . 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 Chronic back pain   . CAD (coronary artery disease)   . Obesity   . Peripheral vascular disease hands and legs  . Anxiety   . Asthma   . GERD (gastroesophageal reflux disease)     takes Omeprazole daily  . Chronic constipation     takes MIralax and Colace daily  . Anemia     takes Folic Acid daily  . Hypothyroidism     takes Synthroid daily  . Depression     takes Cymbalta for "severe" depression  . Neuromuscular disorder     carpal tunnel in right hand  . Hypertension     takes Metoprolol and Lisinopril daily, sees Dr Bea Graff  . COPD (chronic obstructive pulmonary disease)   . Pneumonia     "today and several times before" (08/30/2012)  . Chronic bronchitis     "q yr w/season changes" (08/30/2012)  . Type II diabetes mellitus     Levemir 2units  daily if > 150  . History of blood transfusion     "several" (08/30/2012)  . Hepatitis C   . Migraine     "last migraine was in 2013" (08/30/2012)  . Headache     "at least monthly" (08/30/2012)  . Arthritis     "left hand, back" (08/30/2012)  . End stage renal disease on dialysis 02/27/2011    Kidneys shut down at time of liver transplant in Sept 2011 at [facility] in [place], she has been on HD ever since.  Dialyzes at [facility] HD on TTS schedule.  Had L forearm graft used 10 months then removed Dec 2012 due to suspected infection.  A right upper arm AVG was placed Dec 2012 but she developed steal symptoms acutely and it was ligated the same day.  Never had an AV fistula due to small veins.  Now has L thigh AVG put in Jan 2013, has not clotted to date.    [name] CAD (coronary artery disease) Jan. 2015    Cath: 20% LAD, 50% D1  . Aortic stenosis   . Tobacco abuse    Past Surgical History  Procedure Laterality Date  . Liver transplant  10/25/2009    sees Dr Ferol Luz 1 every 6 months, saw last in Dec 2013. [name] Coord [phone]  . Small intestine surgery  90's  . Thrombectomy    . Arteriovenous graft  placement Left 10/03/10     forearm  . Avgg removal  12/23/2010    Procedure: REMOVAL OF ARTERIOVENOUS GORETEX GRAFT ([place]);  Surgeon: Elam Dutch, MD;  Location: [place];  Service: Vascular;  Laterality: Left;  procedure started @1736 -1852  . Insertion of dialysis catheter  12/23/2010    Procedure: INSERTION OF DIALYSIS CATHETER;  Surgeon: Elam Dutch, MD;  Location: [place];  Service: Vascular;  Laterality: Right;  Right Internal Jugular 28cm dialysis catheter insertion procedure time 1701-1720   . Cholecystectomy  1993  . Cystoscopy  1990's  . Spinal growth rods  2010    "put 2 metal rods in my back; they had detetriorated" (08/30/2012)  . Av fistula placement  01/29/2011    Procedure: INSERTION OF ARTERIOVENOUS (AV) GORE-TEX GRAFT ARM;  Surgeon: Elam Dutch, MD;  Location: [place];  Service: Vascular;  Laterality: Right;  . Av fistula placement  03/10/2011    Procedure: INSERTION OF ARTERIOVENOUS (AV) GORE-TEX GRAFT THIGH;  Surgeon: Elam Dutch, MD;  Location: [place];  Service: Vascular;  Laterality: Left;  . Tubal ligation  1990's  . Cardiac catheterization  2014  . Aortic valve replacement N/A 03/15/2013    AVR; Surgeon: Ivin Poot, MD;  Location: [facility] OR; Open Heart Surgery;  25mmCarboMedics mechanical prosthesis, top hat valve  . Coronary artery bypass graft N/A 03/15/2013    Procedure: CORONARY ARTERY BYPASS GRAFTING (CABG) times one using left internal mammary artery.;  Surgeon: Ivin Poot, MD;  Location: [place];  Service: Open Heart Surgery;  Laterality: N/A;  POSS CABG X 1  . Intraoperative transesophageal echocardiogram N/A 03/15/2013    Procedure: INTRAOPERATIVE TRANSESOPHAGEAL ECHOCARDIOGRAM;  Surgeon: Ivin Poot, MD;  Location: [place];  Service: Open Heart Surgery;  Laterality: N/A;   Family History  Problem Relation Age of Onset  . Cancer Mother   . Diabetes Mother   . Hypertension Mother   . Stroke Mother   . Cancer Father   . Anesthesia problems Neg Hx   . Hypotension Neg Hx   . Malignant hyperthermia Neg Hx   . Pseudochol deficiency Neg Hx    History  Substance Use Topics  . Smoking status: Former Smoker -- 0.75 packs/day for 40 years    Types: Cigarettes    Quit date: 03/13/2013  . Smokeless tobacco: Never Used     Comment: still smokng 1 or 2 a day  . Alcohol Use: Yes     Comment: 08/30/2012 "last drink was at a wedding July, 2014; had a small glass of wine; never had problems w/alcohol"   OB History   Grav Para Term Preterm Abortions TAB SAB Ect Mult Living                 Review of Systems  All other systems negative except as documented in the HPI. All pertinent positives and negatives as reviewed in the HPI.  Allergies  Acetaminophen; Codeine; and Morphine  Home Medications   Prior to Admission medications    Medication Sig Start Date End Date Taking? Authorizing Provider  albuterol (PROVENTIL,VENTOLIN) 90 MCG/ACT inhaler Inhale 1-2 puffs into the lungs every 6 (six) hours as needed for wheezing or shortness of breath.    Yes Historical Provider, MD  amLODipine (NORVASC) 10 MG tablet Take 10 mg by mouth daily.   Yes Historical Provider, MD  aspirin EC 81 MG tablet Take 81 mg by mouth daily.     Yes Historical Provider,  MD  calcium carbonate (TUMS - DOSED IN MG ELEMENTAL CALCIUM) 500 MG chewable tablet Chew 1 tablet by mouth daily.   Yes Historical Provider, MD  cinacalcet (SENSIPAR) 30 MG tablet Take 30 mg by mouth daily.   Yes Historical Provider, MD  DULoxetine (CYMBALTA) 60 MG capsule Take 60 mg by mouth at bedtime.    Yes Historical Provider, MD  gabapentin (NEURONTIN) 300 MG capsule Take 300 mg by mouth at bedtime.    Yes Historical Provider, MD  hydrALAZINE (APRESOLINE) 25 MG tablet Take 25 mg by mouth 3 (three) times daily.   Yes Historical Provider, MD  insulin detemir (LEVEMIR) 100 UNIT/ML injection Inject 5 Units into the skin at bedtime.   Yes Historical Provider, MD  levothyroxine (SYNTHROID, LEVOTHROID) 150 MCG tablet Take 150 mcg by mouth daily before breakfast.   Yes Historical Provider, MD  lidocaine (LIDODERM) 5 % Place 1 patch onto the skin daily. Remove & Discard patch within 12 hours or as directed by MD   Yes Historical Provider, MD  LORazepam (ATIVAN) 0.5 MG tablet Take 0.5 mg by mouth at bedtime as needed for sleep.  02/08/13  Yes Historical Provider, MD  metoprolol (LOPRESSOR) 50 MG tablet Take 50 mg by mouth 2 (two) times daily.   Yes Historical Provider, MD  Multiple Vitamin (MULTIVITAMIN WITH MINERALS) TABS tablet Take 1 tablet by mouth at bedtime.    Yes Historical Provider, MD  promethazine (PHENERGAN) 12.5 MG tablet Take 12.5 mg by mouth every 4 (four) hours as needed for nausea.  02/09/13  Yes Historical Provider, MD  tacrolimus (PROGRAF) 1 MG capsule Take 5 mg by mouth 2  (two) times daily.    Yes Historical Provider, MD  traMADol (ULTRAM) 50 MG tablet Take 50 mg by mouth every 12 (twelve) hours as needed for moderate pain.  02/09/13  Yes Historical Provider, MD  VELPHORO 500 MG chewable tablet Chew 1 tablet by mouth 3 (three) times daily with meals.  06/10/13  Yes Historical Provider, MD  warfarin (COUMADIN) 2 MG tablet Take 3 mg by mouth daily at 6 PM.    Yes Historical Provider, MD   BP 175/69  Pulse 77  Temp(Src) 97.4 F (36.3 C) (Oral)  Resp 18  SpO2 100% Physical Exam  Constitutional: She appears well-developed and well-nourished. No distress.  HENT:  Head: Normocephalic and atraumatic.  Mouth/Throat: Oropharynx is clear and moist.  Eyes: Pupils are equal, round, and reactive to light.  Neck: Normal range of motion. Neck supple.  Cardiovascular: Normal rate, regular rhythm and normal heart sounds.  Exam reveals no gallop and no friction rub.   No murmur heard. Pulmonary/Chest: Effort normal and breath sounds normal. No respiratory distress.  Abdominal: Soft. Normal appearance and bowel sounds are normal. She exhibits no distension. There is generalized tenderness. There is no rigidity, no rebound, no guarding and no CVA tenderness.    ED Course  Procedures (including critical care time) Labs Review Labs Reviewed  CBC WITH DIFFERENTIAL - Abnormal; Notable for the following:    RBC 3.38 (*)    Hemoglobin 10.8 (*)    HCT 32.0 (*)    Platelets 142 (*)    All other components within normal limits  COMPREHENSIVE METABOLIC PANEL - Abnormal; Notable for the following:    Potassium 3.5 (*)    Glucose, Bld 200 (*)    BUN 25 (*)    Creatinine, Ser 5.95 (*)    Calcium 7.9 (*)    Albumin 3.0 (*)  GFR calc non Af Amer 7 (*)    GFR calc Af Amer 8 (*)    Anion gap 17 (*)    All other components within normal limits  APTT - Abnormal; Notable for the following:    aPTT 41 (*)    All other components within normal limits  PROTIME-INR - Abnormal;  Notable for the following:    Prothrombin Time 21.4 (*)    INR 1.86 (*)    All other components within normal limits  LIPASE, BLOOD  I-STAT CG4 LACTIC ACID, ED    Imaging Review Ct Abdomen Pelvis Wo Contrast  11/03/2013   CLINICAL DATA:  Abdominal pain.  Nausea and vomiting for 2 days.  EXAM: CT ABDOMEN AND PELVIS WITHOUT CONTRAST  TECHNIQUE: Multidetector CT imaging of the abdomen and pelvis was performed following the standard protocol without IV contrast.  COMPARISON:  CT abdomen and pelvis 11/01/2013.  FINDINGS: The lung bases are clear. There is cardiomegaly. No pleural or pericardial effusion.  Postoperative change of liver transplant is identified, unchanged in appearance. The kidneys are atrophic bilaterally with cystic change consistent with chronic renal failure. The gallbladder has been removed. The spleen, adrenal glands and pancreas are unremarkable. Extensive aortoiliac atherosclerosis without aneurysm is identified. Uterus, adnexa and urinary bladder are unremarkable. The stomach, small and large bowel and appendix appear normal. Postoperative change of L4-S1 fusion is noted. No worrisome bony lesion is identified.  IMPRESSION: No acute finding abdomen and pelvis. No finding to explain the patient's symptoms.  Cardiomegaly.  Atherosclerosis.  Status post liver transplant, cholecystectomy and lower lumbar fusion.   Electronically Signed   By: Inge Rise M.D.   On: 11/03/2013 14:17    Patient has been given 250 mL of fluid with pain medication and antiemetics.  Patient's CT did not show any significant findings.  Patient's lab testing did not show any significant abnormality.Fluid trial and possible discharge  Brent General, PA-C 11/05/13 1032

## 2013-11-03 NOTE — Discharge Instructions (Signed)
1. Medications: phenergan, usual home medications 2. Treatment: rest, drink plenty of fluids, attend dialysis as scheduled 3. Follow Up: Please followup with your primary doctor in 3 days for discussion of your diagnoses and further evaluation after today's visit; if you do not have a primary care doctor use the resource guide provided to find one;    Abdominal Pain, Women Abdominal (stomach, pelvic, or belly) pain can be caused by many things. It is important to tell your doctor:  The location of the pain.  Does it come and go or is it present all the time?  Are there things that start the pain (eating certain foods, exercise)?  Are there other symptoms associated with the pain (fever, nausea, vomiting, diarrhea)? All of this is helpful to know when trying to find the cause of the pain. CAUSES   Stomach: virus or bacteria infection, or ulcer.  Intestine: appendicitis (inflamed appendix), regional ileitis (Crohn's disease), ulcerative colitis (inflamed colon), irritable bowel syndrome, diverticulitis (inflamed diverticulum of the colon), or cancer of the stomach or intestine.  Gallbladder disease or stones in the gallbladder.  Kidney disease, kidney stones, or infection.  Pancreas infection or cancer.  Fibromyalgia (pain disorder).  Diseases of the female organs:  Uterus: fibroid (non-cancerous) tumors or infection.  Fallopian tubes: infection or tubal pregnancy.  Ovary: cysts or tumors.  Pelvic adhesions (scar tissue).  Endometriosis (uterus lining tissue growing in the pelvis and on the pelvic organs).  Pelvic congestion syndrome (female organs filling up with blood just before the menstrual period).  Pain with the menstrual period.  Pain with ovulation (producing an egg).  Pain with an IUD (intrauterine device, birth control) in the uterus.  Cancer of the female organs.  Functional pain (pain not caused by a disease, may improve without  treatment).  Psychological pain.  Depression. DIAGNOSIS  Your doctor will decide the seriousness of your pain by doing an examination.  Blood tests.  X-rays.  Ultrasound.  CT scan (computed tomography, special type of X-ray).  MRI (magnetic resonance imaging).  Cultures, for infection.  Barium enema (dye inserted in the large intestine, to better view it with X-rays).  Colonoscopy (looking in intestine with a lighted tube).  Laparoscopy (minor surgery, looking in abdomen with a lighted tube).  Major abdominal exploratory surgery (looking in abdomen with a large incision). TREATMENT  The treatment will depend on the cause of the pain.   Many cases can be observed and treated at home.  Over-the-counter medicines recommended by your caregiver.  Prescription medicine.  Antibiotics, for infection.  Birth control pills, for painful periods or for ovulation pain.  Hormone treatment, for endometriosis.  Nerve blocking injections.  Physical therapy.  Antidepressants.  Counseling with a psychologist or psychiatrist.  Minor or major surgery. HOME CARE INSTRUCTIONS   Do not take laxatives, unless directed by your caregiver.  Take over-the-counter pain medicine only if ordered by your caregiver. Do not take aspirin because it can cause an upset stomach or bleeding.  Try a clear liquid diet (broth or water) as ordered by your caregiver. Slowly move to a bland diet, as tolerated, if the pain is related to the stomach or intestine.  Have a thermometer and take your temperature several times a day, and record it.  Bed rest and sleep, if it helps the pain.  Avoid sexual intercourse, if it causes pain.  Avoid stressful situations.  Keep your follow-up appointments and tests, as your caregiver orders.  If the pain does not go  away with medicine or surgery, you may try:  Acupuncture.  Relaxation exercises (yoga, meditation).  Group therapy.  Counseling. SEEK  MEDICAL CARE IF:   You notice certain foods cause stomach pain.  Your home care treatment is not helping your pain.  You need stronger pain medicine.  You want your IUD removed.  You feel faint or lightheaded.  You develop nausea and vomiting.  You develop a rash.  You are having side effects or an allergy to your medicine. SEEK IMMEDIATE MEDICAL CARE IF:   Your pain does not go away or gets worse.  You have a fever.  Your pain is felt only in portions of the abdomen. The right side could possibly be appendicitis. The left lower portion of the abdomen could be colitis or diverticulitis.  You are passing blood in your stools (bright red or black tarry stools, with or without vomiting).  You have blood in your urine.  You develop chills, with or without a fever.  You pass out. MAKE SURE YOU:   Understand these instructions.  Will watch your condition.  Will get help right away if you are not doing well or get worse. Document Released: 11/24/2006 Document Revised: 06/13/2013 Document Reviewed: 12/14/2008 [facility] Patient Information 2015 [place], [place]. This information is not intended to replace advice given to you by your health care provider. Make sure you discuss any questions you have with your health care provider.

## 2013-11-03 NOTE — ED Notes (Signed)
Bed: VF64 Expected date:  Expected time:  Means of arrival:  Comments: Dialysis pt, abd pain

## 2013-11-03 NOTE — ED Provider Notes (Signed)
58 year old female dialysis patient who presents with abdominal discomfort and pain, has been going on over 24 hours, sent here from dialysis prior to finishing dialysis session. Multiple surgeries in the past including liver transplant and her surgery. On exam she has a soft abdomen which is diffusely mildly tender but not peritoneal, bedside ultrasound results negative for aneurysm, see below, will need CT scan to further evaluate the source of the patient's symptoms.  EMERGENCY DEPARTMENT ULTRASOUND  Study: Limited Retroperitoneal Ultrasound of the Abdominal Aorta.  INDICATIONS:Abdominal pain Multiple views of the abdominal aorta were obtained in real-time from the diaphragmatic hiatus to the aortic bifurcation in transverse planes with a multi-frequency probe. PERFORMED BY: Myself IMAGES ARCHIVED?: Yes FINDINGS: Maximum aortic dimensions are 2.2cm LIMITATIONS:  Bowel gas and Abdominal pain INTERPRETATION:  No abdominal aortic aneurysm  Pt improved after pain medicine, no findings on w/u concerning for pathological process, can f/u outpt or return if sx worsen, pt in agreement  Medical screening examination/treatment/procedure(s) were conducted as a shared visit with non-physician practitioner(s) and myself.  I personally evaluated the patient during the encounter.  Clinical Impression:   Final diagnoses:  Generalized abdominal pain  Non-intractable vomiting with nausea, vomiting of unspecified type  End stage renal disease on dialysis-TTS  DIABETES MELLITUS, TYPE II      Johnna Acosta, MD 11/06/13 [phone]

## 2013-11-04 LAB — PROTIME-INR: INR: 1.9 — AB (ref 0.9–1.1)

## 2013-11-05 ENCOUNTER — Ambulatory Visit (INDEPENDENT_AMBULATORY_CARE_PROVIDER_SITE_OTHER): Payer: Medicare Other | Admitting: Pharmacist Clinician (PhC)/ Clinical Pharmacy Specialist

## 2013-11-05 DIAGNOSIS — IMO0002 Reserved for concepts with insufficient information to code with codable children: Secondary | ICD-10-CM | POA: Diagnosis not present

## 2013-11-05 DIAGNOSIS — N2581 Secondary hyperparathyroidism of renal origin: Secondary | ICD-10-CM | POA: Diagnosis not present

## 2013-11-05 DIAGNOSIS — Z7901 Long term (current) use of anticoagulants: Secondary | ICD-10-CM

## 2013-11-05 DIAGNOSIS — D631 Anemia in chronic kidney disease: Secondary | ICD-10-CM | POA: Diagnosis not present

## 2013-11-05 DIAGNOSIS — Z952 Presence of prosthetic heart valve: Secondary | ICD-10-CM

## 2013-11-05 DIAGNOSIS — E118 Type 2 diabetes mellitus with unspecified complications: Secondary | ICD-10-CM | POA: Diagnosis not present

## 2013-11-05 DIAGNOSIS — E1165 Type 2 diabetes mellitus with hyperglycemia: Secondary | ICD-10-CM | POA: Diagnosis not present

## 2013-11-05 DIAGNOSIS — Z954 Presence of other heart-valve replacement: Secondary | ICD-10-CM

## 2013-11-05 DIAGNOSIS — N186 End stage renal disease: Secondary | ICD-10-CM | POA: Diagnosis not present

## 2013-11-06 NOTE — ED Provider Notes (Signed)
Medical screening examination/treatment/procedure(s) were conducted as a shared visit with non-physician practitioner(s) and myself.  I personally evaluated the patient during the encounter  Please see my separate respective documentation pertaining to this patient encounter   Johnna Acosta, MD 11/06/13 [phone]

## 2013-11-07 DIAGNOSIS — I2510 Atherosclerotic heart disease of native coronary artery without angina pectoris: Secondary | ICD-10-CM | POA: Diagnosis not present

## 2013-11-07 DIAGNOSIS — I517 Cardiomegaly: Secondary | ICD-10-CM | POA: Diagnosis not present

## 2013-11-07 DIAGNOSIS — Z944 Liver transplant status: Secondary | ICD-10-CM | POA: Diagnosis not present

## 2013-11-07 DIAGNOSIS — Z794 Long term (current) use of insulin: Secondary | ICD-10-CM | POA: Diagnosis not present

## 2013-11-07 DIAGNOSIS — B1710 Acute hepatitis C without hepatic coma: Secondary | ICD-10-CM | POA: Diagnosis not present

## 2013-11-07 DIAGNOSIS — D696 Thrombocytopenia, unspecified: Secondary | ICD-10-CM | POA: Diagnosis not present

## 2013-11-07 DIAGNOSIS — E785 Hyperlipidemia, unspecified: Secondary | ICD-10-CM | POA: Diagnosis not present

## 2013-11-07 DIAGNOSIS — R10819 Abdominal tenderness, unspecified site: Secondary | ICD-10-CM | POA: Diagnosis not present

## 2013-11-07 DIAGNOSIS — E7800 Pure hypercholesterolemia, unspecified: Secondary | ICD-10-CM | POA: Diagnosis not present

## 2013-11-07 DIAGNOSIS — K920 Hematemesis: Secondary | ICD-10-CM | POA: Diagnosis not present

## 2013-11-07 DIAGNOSIS — R109 Unspecified abdominal pain: Secondary | ICD-10-CM | POA: Diagnosis not present

## 2013-11-07 DIAGNOSIS — R1084 Generalized abdominal pain: Secondary | ICD-10-CM | POA: Diagnosis not present

## 2013-11-07 DIAGNOSIS — K2970 Gastritis, unspecified, without bleeding: Secondary | ICD-10-CM | POA: Diagnosis not present

## 2013-11-07 DIAGNOSIS — Z954 Presence of other heart-valve replacement: Secondary | ICD-10-CM | POA: Diagnosis not present

## 2013-11-07 DIAGNOSIS — R059 Cough, unspecified: Secondary | ICD-10-CM | POA: Diagnosis not present

## 2013-11-07 DIAGNOSIS — N186 End stage renal disease: Secondary | ICD-10-CM | POA: Diagnosis not present

## 2013-11-07 DIAGNOSIS — R05 Cough: Secondary | ICD-10-CM | POA: Diagnosis not present

## 2013-11-07 DIAGNOSIS — F17200 Nicotine dependence, unspecified, uncomplicated: Secondary | ICD-10-CM | POA: Diagnosis not present

## 2013-11-07 DIAGNOSIS — I10 Essential (primary) hypertension: Secondary | ICD-10-CM | POA: Diagnosis not present

## 2013-11-07 DIAGNOSIS — D649 Anemia, unspecified: Secondary | ICD-10-CM | POA: Diagnosis not present

## 2013-11-07 DIAGNOSIS — Z79899 Other long term (current) drug therapy: Secondary | ICD-10-CM | POA: Diagnosis not present

## 2013-11-07 DIAGNOSIS — R5381 Other malaise: Secondary | ICD-10-CM | POA: Diagnosis not present

## 2013-11-07 DIAGNOSIS — Z992 Dependence on renal dialysis: Secondary | ICD-10-CM | POA: Diagnosis not present

## 2013-11-07 DIAGNOSIS — E119 Type 2 diabetes mellitus without complications: Secondary | ICD-10-CM | POA: Diagnosis not present

## 2013-11-07 DIAGNOSIS — R079 Chest pain, unspecified: Secondary | ICD-10-CM | POA: Diagnosis not present

## 2013-11-07 DIAGNOSIS — E039 Hypothyroidism, unspecified: Secondary | ICD-10-CM | POA: Diagnosis not present

## 2013-11-07 DIAGNOSIS — Z951 Presence of aortocoronary bypass graft: Secondary | ICD-10-CM | POA: Diagnosis not present

## 2013-11-07 DIAGNOSIS — J810 Acute pulmonary edema: Secondary | ICD-10-CM | POA: Diagnosis not present

## 2013-11-07 DIAGNOSIS — K219 Gastro-esophageal reflux disease without esophagitis: Secondary | ICD-10-CM | POA: Diagnosis not present

## 2013-11-07 DIAGNOSIS — R5383 Other fatigue: Secondary | ICD-10-CM | POA: Diagnosis not present

## 2013-11-07 DIAGNOSIS — Z7982 Long term (current) use of aspirin: Secondary | ICD-10-CM | POA: Diagnosis not present

## 2013-11-07 DIAGNOSIS — J449 Chronic obstructive pulmonary disease, unspecified: Secondary | ICD-10-CM | POA: Diagnosis not present

## 2013-11-07 DIAGNOSIS — R51 Headache: Secondary | ICD-10-CM | POA: Diagnosis not present

## 2013-11-07 DIAGNOSIS — R110 Nausea: Secondary | ICD-10-CM | POA: Diagnosis not present

## 2013-11-07 DIAGNOSIS — I120 Hypertensive chronic kidney disease with stage 5 chronic kidney disease or end stage renal disease: Secondary | ICD-10-CM | POA: Diagnosis not present

## 2013-11-07 DIAGNOSIS — K769 Liver disease, unspecified: Secondary | ICD-10-CM | POA: Diagnosis not present

## 2013-11-07 DIAGNOSIS — R112 Nausea with vomiting, unspecified: Secondary | ICD-10-CM | POA: Diagnosis not present

## 2013-11-07 DIAGNOSIS — Z7901 Long term (current) use of anticoagulants: Secondary | ICD-10-CM | POA: Diagnosis not present

## 2013-11-08 DIAGNOSIS — R109 Unspecified abdominal pain: Secondary | ICD-10-CM | POA: Diagnosis not present

## 2013-11-08 DIAGNOSIS — K5902 Outlet dysfunction constipation: Secondary | ICD-10-CM | POA: Diagnosis not present

## 2013-11-08 DIAGNOSIS — Z944 Liver transplant status: Secondary | ICD-10-CM | POA: Diagnosis not present

## 2013-11-08 DIAGNOSIS — N186 End stage renal disease: Secondary | ICD-10-CM | POA: Diagnosis not present

## 2013-11-08 DIAGNOSIS — R079 Chest pain, unspecified: Secondary | ICD-10-CM | POA: Diagnosis not present

## 2013-11-08 DIAGNOSIS — R112 Nausea with vomiting, unspecified: Secondary | ICD-10-CM | POA: Diagnosis not present

## 2013-11-08 NOTE — ED Provider Notes (Signed)
Medical screening examination/treatment/procedure(s) were performed by non-physician practitioner and as supervising physician I was immediately available for consultation/collaboration.  Ephraim Hamburger, MD 11/08/13 [phone]

## 2013-11-09 DIAGNOSIS — R112 Nausea with vomiting, unspecified: Secondary | ICD-10-CM | POA: Diagnosis not present

## 2013-11-09 DIAGNOSIS — I2510 Atherosclerotic heart disease of native coronary artery without angina pectoris: Secondary | ICD-10-CM | POA: Diagnosis not present

## 2013-11-09 DIAGNOSIS — N186 End stage renal disease: Secondary | ICD-10-CM | POA: Diagnosis not present

## 2013-11-09 DIAGNOSIS — R079 Chest pain, unspecified: Secondary | ICD-10-CM | POA: Diagnosis not present

## 2013-11-09 DIAGNOSIS — I359 Nonrheumatic aortic valve disorder, unspecified: Secondary | ICD-10-CM | POA: Diagnosis not present

## 2013-11-10 DIAGNOSIS — J811 Chronic pulmonary edema: Secondary | ICD-10-CM | POA: Diagnosis not present

## 2013-11-10 DIAGNOSIS — D696 Thrombocytopenia, unspecified: Secondary | ICD-10-CM | POA: Diagnosis not present

## 2013-11-10 DIAGNOSIS — J90 Pleural effusion, not elsewhere classified: Secondary | ICD-10-CM | POA: Diagnosis not present

## 2013-11-10 DIAGNOSIS — I120 Hypertensive chronic kidney disease with stage 5 chronic kidney disease or end stage renal disease: Secondary | ICD-10-CM | POA: Diagnosis not present

## 2013-11-10 DIAGNOSIS — R112 Nausea with vomiting, unspecified: Secondary | ICD-10-CM | POA: Diagnosis not present

## 2013-11-10 DIAGNOSIS — Z992 Dependence on renal dialysis: Secondary | ICD-10-CM | POA: Diagnosis not present

## 2013-11-10 DIAGNOSIS — Z7901 Long term (current) use of anticoagulants: Secondary | ICD-10-CM | POA: Diagnosis not present

## 2013-11-10 DIAGNOSIS — Z952 Presence of prosthetic heart valve: Secondary | ICD-10-CM | POA: Diagnosis not present

## 2013-11-10 DIAGNOSIS — Z951 Presence of aortocoronary bypass graft: Secondary | ICD-10-CM | POA: Diagnosis not present

## 2013-11-10 DIAGNOSIS — E119 Type 2 diabetes mellitus without complications: Secondary | ICD-10-CM | POA: Diagnosis not present

## 2013-11-10 DIAGNOSIS — Z7982 Long term (current) use of aspirin: Secondary | ICD-10-CM | POA: Diagnosis not present

## 2013-11-10 DIAGNOSIS — F329 Major depressive disorder, single episode, unspecified: Secondary | ICD-10-CM | POA: Diagnosis not present

## 2013-11-10 DIAGNOSIS — J810 Acute pulmonary edema: Secondary | ICD-10-CM | POA: Diagnosis not present

## 2013-11-10 DIAGNOSIS — Z87891 Personal history of nicotine dependence: Secondary | ICD-10-CM | POA: Diagnosis not present

## 2013-11-10 DIAGNOSIS — I959 Hypotension, unspecified: Secondary | ICD-10-CM | POA: Diagnosis not present

## 2013-11-10 DIAGNOSIS — J9600 Acute respiratory failure, unspecified whether with hypoxia or hypercapnia: Secondary | ICD-10-CM | POA: Diagnosis not present

## 2013-11-10 DIAGNOSIS — K219 Gastro-esophageal reflux disease without esophagitis: Secondary | ICD-10-CM | POA: Diagnosis not present

## 2013-11-10 DIAGNOSIS — R6883 Chills (without fever): Secondary | ICD-10-CM | POA: Diagnosis not present

## 2013-11-10 DIAGNOSIS — I2510 Atherosclerotic heart disease of native coronary artery without angina pectoris: Secondary | ICD-10-CM | POA: Diagnosis not present

## 2013-11-10 DIAGNOSIS — R51 Headache: Secondary | ICD-10-CM | POA: Diagnosis not present

## 2013-11-10 DIAGNOSIS — E039 Hypothyroidism, unspecified: Secondary | ICD-10-CM | POA: Diagnosis not present

## 2013-11-10 DIAGNOSIS — N186 End stage renal disease: Secondary | ICD-10-CM | POA: Diagnosis not present

## 2013-11-10 DIAGNOSIS — D539 Nutritional anemia, unspecified: Secondary | ICD-10-CM | POA: Diagnosis not present

## 2013-11-10 DIAGNOSIS — K920 Hematemesis: Secondary | ICD-10-CM | POA: Diagnosis not present

## 2013-11-10 DIAGNOSIS — I471 Supraventricular tachycardia: Secondary | ICD-10-CM | POA: Diagnosis not present

## 2013-11-11 DIAGNOSIS — N186 End stage renal disease: Secondary | ICD-10-CM | POA: Diagnosis not present

## 2013-11-11 DIAGNOSIS — R109 Unspecified abdominal pain: Secondary | ICD-10-CM | POA: Diagnosis not present

## 2013-11-11 DIAGNOSIS — I959 Hypotension, unspecified: Secondary | ICD-10-CM | POA: Diagnosis not present

## 2013-11-11 DIAGNOSIS — I359 Nonrheumatic aortic valve disorder, unspecified: Secondary | ICD-10-CM | POA: Diagnosis not present

## 2013-11-11 DIAGNOSIS — R112 Nausea with vomiting, unspecified: Secondary | ICD-10-CM | POA: Diagnosis not present

## 2013-11-11 DIAGNOSIS — E119 Type 2 diabetes mellitus without complications: Secondary | ICD-10-CM | POA: Diagnosis not present

## 2013-11-11 DIAGNOSIS — R51 Headache: Secondary | ICD-10-CM | POA: Diagnosis not present

## 2013-11-11 DIAGNOSIS — I120 Hypertensive chronic kidney disease with stage 5 chronic kidney disease or end stage renal disease: Secondary | ICD-10-CM | POA: Diagnosis not present

## 2013-11-14 ENCOUNTER — Ambulatory Visit: Payer: Medicare Other | Admitting: Pharmacist Clinician (PhC)/ Clinical Pharmacy Specialist

## 2013-11-14 DIAGNOSIS — N186 End stage renal disease: Secondary | ICD-10-CM | POA: Diagnosis not present

## 2013-11-14 DIAGNOSIS — N2581 Secondary hyperparathyroidism of renal origin: Secondary | ICD-10-CM | POA: Diagnosis not present

## 2013-11-14 DIAGNOSIS — E118 Type 2 diabetes mellitus with unspecified complications: Secondary | ICD-10-CM | POA: Diagnosis not present

## 2013-11-14 DIAGNOSIS — Z23 Encounter for immunization: Secondary | ICD-10-CM | POA: Diagnosis not present

## 2013-11-14 DIAGNOSIS — D631 Anemia in chronic kidney disease: Secondary | ICD-10-CM | POA: Diagnosis not present

## 2013-11-15 DIAGNOSIS — E118 Type 2 diabetes mellitus with unspecified complications: Secondary | ICD-10-CM | POA: Diagnosis not present

## 2013-11-15 DIAGNOSIS — I359 Nonrheumatic aortic valve disorder, unspecified: Secondary | ICD-10-CM | POA: Diagnosis not present

## 2013-11-15 DIAGNOSIS — N2581 Secondary hyperparathyroidism of renal origin: Secondary | ICD-10-CM | POA: Diagnosis not present

## 2013-11-15 DIAGNOSIS — Z23 Encounter for immunization: Secondary | ICD-10-CM | POA: Diagnosis not present

## 2013-11-15 DIAGNOSIS — N186 End stage renal disease: Secondary | ICD-10-CM | POA: Diagnosis not present

## 2013-11-15 DIAGNOSIS — D631 Anemia in chronic kidney disease: Secondary | ICD-10-CM | POA: Diagnosis not present

## 2013-11-15 LAB — PROTIME-INR: INR: 2.4 — AB (ref 0.9–1.1)

## 2013-11-17 DIAGNOSIS — Z23 Encounter for immunization: Secondary | ICD-10-CM | POA: Diagnosis not present

## 2013-11-17 DIAGNOSIS — D631 Anemia in chronic kidney disease: Secondary | ICD-10-CM | POA: Diagnosis not present

## 2013-11-17 DIAGNOSIS — N2581 Secondary hyperparathyroidism of renal origin: Secondary | ICD-10-CM | POA: Diagnosis not present

## 2013-11-17 DIAGNOSIS — E118 Type 2 diabetes mellitus with unspecified complications: Secondary | ICD-10-CM | POA: Diagnosis not present

## 2013-11-17 DIAGNOSIS — N186 End stage renal disease: Secondary | ICD-10-CM | POA: Diagnosis not present

## 2013-11-18 ENCOUNTER — Ambulatory Visit (INDEPENDENT_AMBULATORY_CARE_PROVIDER_SITE_OTHER): Payer: Medicare Other | Admitting: Pharmacist Clinician (PhC)/ Clinical Pharmacy Specialist

## 2013-11-18 DIAGNOSIS — Z954 Presence of other heart-valve replacement: Secondary | ICD-10-CM

## 2013-11-18 DIAGNOSIS — Z952 Presence of prosthetic heart valve: Secondary | ICD-10-CM

## 2013-11-19 DIAGNOSIS — N2581 Secondary hyperparathyroidism of renal origin: Secondary | ICD-10-CM | POA: Diagnosis not present

## 2013-11-19 DIAGNOSIS — E118 Type 2 diabetes mellitus with unspecified complications: Secondary | ICD-10-CM | POA: Diagnosis not present

## 2013-11-19 DIAGNOSIS — N186 End stage renal disease: Secondary | ICD-10-CM | POA: Diagnosis not present

## 2013-11-19 DIAGNOSIS — D631 Anemia in chronic kidney disease: Secondary | ICD-10-CM | POA: Diagnosis not present

## 2013-11-19 DIAGNOSIS — Z23 Encounter for immunization: Secondary | ICD-10-CM | POA: Diagnosis not present

## 2013-11-22 DIAGNOSIS — N186 End stage renal disease: Secondary | ICD-10-CM | POA: Diagnosis not present

## 2013-11-22 DIAGNOSIS — D631 Anemia in chronic kidney disease: Secondary | ICD-10-CM | POA: Diagnosis not present

## 2013-11-22 DIAGNOSIS — E118 Type 2 diabetes mellitus with unspecified complications: Secondary | ICD-10-CM | POA: Diagnosis not present

## 2013-11-22 DIAGNOSIS — N2581 Secondary hyperparathyroidism of renal origin: Secondary | ICD-10-CM | POA: Diagnosis not present

## 2013-11-22 DIAGNOSIS — Z23 Encounter for immunization: Secondary | ICD-10-CM | POA: Diagnosis not present

## 2013-11-24 DIAGNOSIS — D631 Anemia in chronic kidney disease: Secondary | ICD-10-CM | POA: Diagnosis not present

## 2013-11-24 DIAGNOSIS — N2581 Secondary hyperparathyroidism of renal origin: Secondary | ICD-10-CM | POA: Diagnosis not present

## 2013-11-24 DIAGNOSIS — N186 End stage renal disease: Secondary | ICD-10-CM | POA: Diagnosis not present

## 2013-11-24 DIAGNOSIS — Z23 Encounter for immunization: Secondary | ICD-10-CM | POA: Diagnosis not present

## 2013-11-24 DIAGNOSIS — E118 Type 2 diabetes mellitus with unspecified complications: Secondary | ICD-10-CM | POA: Diagnosis not present

## 2013-11-26 DIAGNOSIS — N2581 Secondary hyperparathyroidism of renal origin: Secondary | ICD-10-CM | POA: Diagnosis not present

## 2013-11-26 DIAGNOSIS — Z23 Encounter for immunization: Secondary | ICD-10-CM | POA: Diagnosis not present

## 2013-11-26 DIAGNOSIS — N186 End stage renal disease: Secondary | ICD-10-CM | POA: Diagnosis not present

## 2013-11-26 DIAGNOSIS — D631 Anemia in chronic kidney disease: Secondary | ICD-10-CM | POA: Diagnosis not present

## 2013-11-26 DIAGNOSIS — E118 Type 2 diabetes mellitus with unspecified complications: Secondary | ICD-10-CM | POA: Diagnosis not present

## 2013-11-29 DIAGNOSIS — I359 Nonrheumatic aortic valve disorder, unspecified: Secondary | ICD-10-CM | POA: Diagnosis not present

## 2013-11-29 DIAGNOSIS — N2581 Secondary hyperparathyroidism of renal origin: Secondary | ICD-10-CM | POA: Diagnosis not present

## 2013-11-29 DIAGNOSIS — N186 End stage renal disease: Secondary | ICD-10-CM | POA: Diagnosis not present

## 2013-11-29 DIAGNOSIS — E118 Type 2 diabetes mellitus with unspecified complications: Secondary | ICD-10-CM | POA: Diagnosis not present

## 2013-11-29 DIAGNOSIS — D631 Anemia in chronic kidney disease: Secondary | ICD-10-CM | POA: Diagnosis not present

## 2013-11-29 DIAGNOSIS — Z23 Encounter for immunization: Secondary | ICD-10-CM | POA: Diagnosis not present

## 2013-11-29 LAB — POCT INR: INR: 1.24

## 2013-11-30 ENCOUNTER — Ambulatory Visit (INDEPENDENT_AMBULATORY_CARE_PROVIDER_SITE_OTHER): Payer: Medicare Other | Admitting: Pharmacist Clinician (PhC)/ Clinical Pharmacy Specialist

## 2013-11-30 DIAGNOSIS — Z952 Presence of prosthetic heart valve: Secondary | ICD-10-CM

## 2013-11-30 DIAGNOSIS — Z954 Presence of other heart-valve replacement: Secondary | ICD-10-CM

## 2013-11-30 DIAGNOSIS — Z944 Liver transplant status: Secondary | ICD-10-CM | POA: Diagnosis not present

## 2013-11-30 DIAGNOSIS — Z79899 Other long term (current) drug therapy: Secondary | ICD-10-CM | POA: Diagnosis not present

## 2013-12-01 DIAGNOSIS — N186 End stage renal disease: Secondary | ICD-10-CM | POA: Diagnosis not present

## 2013-12-01 DIAGNOSIS — E118 Type 2 diabetes mellitus with unspecified complications: Secondary | ICD-10-CM | POA: Diagnosis not present

## 2013-12-01 DIAGNOSIS — N2581 Secondary hyperparathyroidism of renal origin: Secondary | ICD-10-CM | POA: Diagnosis not present

## 2013-12-01 DIAGNOSIS — D631 Anemia in chronic kidney disease: Secondary | ICD-10-CM | POA: Diagnosis not present

## 2013-12-01 DIAGNOSIS — Z23 Encounter for immunization: Secondary | ICD-10-CM | POA: Diagnosis not present

## 2013-12-03 DIAGNOSIS — N186 End stage renal disease: Secondary | ICD-10-CM | POA: Diagnosis not present

## 2013-12-03 DIAGNOSIS — N2581 Secondary hyperparathyroidism of renal origin: Secondary | ICD-10-CM | POA: Diagnosis not present

## 2013-12-03 DIAGNOSIS — Z23 Encounter for immunization: Secondary | ICD-10-CM | POA: Diagnosis not present

## 2013-12-03 DIAGNOSIS — E118 Type 2 diabetes mellitus with unspecified complications: Secondary | ICD-10-CM | POA: Diagnosis not present

## 2013-12-03 DIAGNOSIS — D631 Anemia in chronic kidney disease: Secondary | ICD-10-CM | POA: Diagnosis not present

## 2013-12-06 DIAGNOSIS — Z23 Encounter for immunization: Secondary | ICD-10-CM | POA: Diagnosis not present

## 2013-12-06 DIAGNOSIS — N2581 Secondary hyperparathyroidism of renal origin: Secondary | ICD-10-CM | POA: Diagnosis not present

## 2013-12-06 DIAGNOSIS — D631 Anemia in chronic kidney disease: Secondary | ICD-10-CM | POA: Diagnosis not present

## 2013-12-06 DIAGNOSIS — E118 Type 2 diabetes mellitus with unspecified complications: Secondary | ICD-10-CM | POA: Diagnosis not present

## 2013-12-06 DIAGNOSIS — N186 End stage renal disease: Secondary | ICD-10-CM | POA: Diagnosis not present

## 2013-12-07 DIAGNOSIS — Z944 Liver transplant status: Secondary | ICD-10-CM | POA: Diagnosis not present

## 2013-12-07 DIAGNOSIS — Z79899 Other long term (current) drug therapy: Secondary | ICD-10-CM | POA: Diagnosis not present

## 2013-12-08 DIAGNOSIS — N2581 Secondary hyperparathyroidism of renal origin: Secondary | ICD-10-CM | POA: Diagnosis not present

## 2013-12-08 DIAGNOSIS — E118 Type 2 diabetes mellitus with unspecified complications: Secondary | ICD-10-CM | POA: Diagnosis not present

## 2013-12-08 DIAGNOSIS — R0789 Other chest pain: Secondary | ICD-10-CM | POA: Diagnosis not present

## 2013-12-08 DIAGNOSIS — R079 Chest pain, unspecified: Secondary | ICD-10-CM | POA: Diagnosis not present

## 2013-12-08 DIAGNOSIS — Z23 Encounter for immunization: Secondary | ICD-10-CM | POA: Diagnosis not present

## 2013-12-08 DIAGNOSIS — I359 Nonrheumatic aortic valve disorder, unspecified: Secondary | ICD-10-CM | POA: Diagnosis not present

## 2013-12-08 DIAGNOSIS — R069 Unspecified abnormalities of breathing: Secondary | ICD-10-CM | POA: Diagnosis not present

## 2013-12-08 DIAGNOSIS — N189 Chronic kidney disease, unspecified: Secondary | ICD-10-CM | POA: Diagnosis not present

## 2013-12-08 DIAGNOSIS — R419 Unspecified symptoms and signs involving cognitive functions and awareness: Secondary | ICD-10-CM | POA: Diagnosis not present

## 2013-12-08 DIAGNOSIS — N186 End stage renal disease: Secondary | ICD-10-CM | POA: Diagnosis not present

## 2013-12-08 DIAGNOSIS — D631 Anemia in chronic kidney disease: Secondary | ICD-10-CM | POA: Diagnosis not present

## 2013-12-08 LAB — POCT INR: INR: 1.65

## 2013-12-08 LAB — PROTIME-INR: INR: 1.7 — AB (ref 0.9–1.1)

## 2013-12-10 DIAGNOSIS — Z23 Encounter for immunization: Secondary | ICD-10-CM | POA: Diagnosis not present

## 2013-12-10 DIAGNOSIS — Z992 Dependence on renal dialysis: Secondary | ICD-10-CM | POA: Diagnosis not present

## 2013-12-10 DIAGNOSIS — N186 End stage renal disease: Secondary | ICD-10-CM | POA: Diagnosis not present

## 2013-12-10 DIAGNOSIS — D631 Anemia in chronic kidney disease: Secondary | ICD-10-CM | POA: Diagnosis not present

## 2013-12-10 DIAGNOSIS — N2581 Secondary hyperparathyroidism of renal origin: Secondary | ICD-10-CM | POA: Diagnosis not present

## 2013-12-10 DIAGNOSIS — E118 Type 2 diabetes mellitus with unspecified complications: Secondary | ICD-10-CM | POA: Diagnosis not present

## 2013-12-12 ENCOUNTER — Ambulatory Visit (INDEPENDENT_AMBULATORY_CARE_PROVIDER_SITE_OTHER): Payer: Medicare Other | Admitting: Pharmacist Clinician (PhC)/ Clinical Pharmacy Specialist

## 2013-12-12 DIAGNOSIS — Z954 Presence of other heart-valve replacement: Secondary | ICD-10-CM

## 2013-12-12 DIAGNOSIS — Z952 Presence of prosthetic heart valve: Secondary | ICD-10-CM

## 2013-12-13 DIAGNOSIS — N186 End stage renal disease: Secondary | ICD-10-CM | POA: Diagnosis not present

## 2013-12-13 DIAGNOSIS — D631 Anemia in chronic kidney disease: Secondary | ICD-10-CM | POA: Diagnosis not present

## 2013-12-13 DIAGNOSIS — E118 Type 2 diabetes mellitus with unspecified complications: Secondary | ICD-10-CM | POA: Diagnosis not present

## 2013-12-13 DIAGNOSIS — N2581 Secondary hyperparathyroidism of renal origin: Secondary | ICD-10-CM | POA: Diagnosis not present

## 2013-12-14 ENCOUNTER — Other Ambulatory Visit: Payer: Self-pay | Admitting: Pharmacist Clinician (PhC)/ Clinical Pharmacy Specialist

## 2013-12-14 ENCOUNTER — Telehealth: Payer: Self-pay | Admitting: Pharmacist Clinician (PhC)/ Clinical Pharmacy Specialist

## 2013-12-14 MED ORDER — WARFARIN SODIUM 2 MG PO TABS
ORAL_TABLET | ORAL | Status: DC
Start: 2013-12-14 — End: 2013-12-21

## 2013-12-14 NOTE — Telephone Encounter (Signed)
Pt called, LMOM confused about dose - states she doesn't have 3mg  tablets, would we please call them in to pharmacy.   Returned call, LMOM, explained that she should be taking 1.5 tablets of 2mg .  Asked that she call back with dose she has been taking, as if it doesn't match what we have asked, we need to know.

## 2013-12-15 ENCOUNTER — Inpatient Hospital Stay (HOSPITAL_COMMUNITY)
Admission: EM | Admit: 2013-12-15 | Discharge: 2013-12-21 | DRG: 286 | Disposition: A | Discharge status: Home / Self Care | Payer: Medicare Other | Attending: Internal Medicine | Admitting: Internal Medicine

## 2013-12-15 ENCOUNTER — Emergency Department (HOSPITAL_COMMUNITY): Payer: Medicare Other

## 2013-12-15 ENCOUNTER — Encounter (HOSPITAL_COMMUNITY): Payer: Self-pay | Admitting: Emergency Medicine

## 2013-12-15 DIAGNOSIS — F17210 Nicotine dependence, cigarettes, uncomplicated: Secondary | ICD-10-CM | POA: Diagnosis present

## 2013-12-15 DIAGNOSIS — I200 Unstable angina: Secondary | ICD-10-CM | POA: Diagnosis present

## 2013-12-15 DIAGNOSIS — K5900 Constipation, unspecified: Secondary | ICD-10-CM | POA: Diagnosis present

## 2013-12-15 DIAGNOSIS — G629 Polyneuropathy, unspecified: Secondary | ICD-10-CM | POA: Diagnosis present

## 2013-12-15 DIAGNOSIS — I739 Peripheral vascular disease, unspecified: Secondary | ICD-10-CM | POA: Diagnosis present

## 2013-12-15 DIAGNOSIS — Z951 Presence of aortocoronary bypass graft: Secondary | ICD-10-CM | POA: Diagnosis not present

## 2013-12-15 DIAGNOSIS — F329 Major depressive disorder, single episode, unspecified: Secondary | ICD-10-CM | POA: Diagnosis present

## 2013-12-15 DIAGNOSIS — Z955 Presence of coronary angioplasty implant and graft: Secondary | ICD-10-CM

## 2013-12-15 DIAGNOSIS — Z944 Liver transplant status: Secondary | ICD-10-CM

## 2013-12-15 DIAGNOSIS — I120 Hypertensive chronic kidney disease with stage 5 chronic kidney disease or end stage renal disease: Secondary | ICD-10-CM | POA: Diagnosis present

## 2013-12-15 DIAGNOSIS — R079 Chest pain, unspecified: Secondary | ICD-10-CM | POA: Diagnosis not present

## 2013-12-15 DIAGNOSIS — E785 Hyperlipidemia, unspecified: Secondary | ICD-10-CM | POA: Diagnosis present

## 2013-12-15 DIAGNOSIS — R0602 Shortness of breath: Secondary | ICD-10-CM | POA: Diagnosis not present

## 2013-12-15 DIAGNOSIS — Z952 Presence of prosthetic heart valve: Secondary | ICD-10-CM | POA: Diagnosis not present

## 2013-12-15 DIAGNOSIS — K219 Gastro-esophageal reflux disease without esophagitis: Secondary | ICD-10-CM | POA: Diagnosis present

## 2013-12-15 DIAGNOSIS — Z794 Long term (current) use of insulin: Secondary | ICD-10-CM | POA: Diagnosis not present

## 2013-12-15 DIAGNOSIS — N2581 Secondary hyperparathyroidism of renal origin: Secondary | ICD-10-CM | POA: Diagnosis present

## 2013-12-15 DIAGNOSIS — N186 End stage renal disease: Secondary | ICD-10-CM | POA: Diagnosis present

## 2013-12-15 DIAGNOSIS — Z992 Dependence on renal dialysis: Secondary | ICD-10-CM

## 2013-12-15 DIAGNOSIS — E119 Type 2 diabetes mellitus without complications: Secondary | ICD-10-CM

## 2013-12-15 DIAGNOSIS — I25700 Atherosclerosis of coronary artery bypass graft(s), unspecified, with unstable angina pectoris: Secondary | ICD-10-CM

## 2013-12-15 DIAGNOSIS — R791 Abnormal coagulation profile: Secondary | ICD-10-CM | POA: Diagnosis not present

## 2013-12-15 DIAGNOSIS — F419 Anxiety disorder, unspecified: Secondary | ICD-10-CM | POA: Diagnosis present

## 2013-12-15 DIAGNOSIS — E039 Hypothyroidism, unspecified: Secondary | ICD-10-CM | POA: Diagnosis present

## 2013-12-15 DIAGNOSIS — I350 Nonrheumatic aortic (valve) stenosis: Secondary | ICD-10-CM | POA: Diagnosis not present

## 2013-12-15 DIAGNOSIS — I25110 Atherosclerotic heart disease of native coronary artery with unstable angina pectoris: Secondary | ICD-10-CM | POA: Diagnosis not present

## 2013-12-15 DIAGNOSIS — Z954 Presence of other heart-valve replacement: Secondary | ICD-10-CM | POA: Diagnosis not present

## 2013-12-15 DIAGNOSIS — J449 Chronic obstructive pulmonary disease, unspecified: Secondary | ICD-10-CM | POA: Diagnosis present

## 2013-12-15 DIAGNOSIS — R918 Other nonspecific abnormal finding of lung field: Secondary | ICD-10-CM | POA: Diagnosis not present

## 2013-12-15 DIAGNOSIS — Z720 Tobacco use: Secondary | ICD-10-CM | POA: Diagnosis present

## 2013-12-15 DIAGNOSIS — D631 Anemia in chronic kidney disease: Secondary | ICD-10-CM | POA: Diagnosis not present

## 2013-12-15 DIAGNOSIS — E669 Obesity, unspecified: Secondary | ICD-10-CM | POA: Diagnosis present

## 2013-12-15 DIAGNOSIS — Z7901 Long term (current) use of anticoagulants: Secondary | ICD-10-CM | POA: Diagnosis not present

## 2013-12-15 DIAGNOSIS — E1165 Type 2 diabetes mellitus with hyperglycemia: Secondary | ICD-10-CM | POA: Diagnosis present

## 2013-12-15 DIAGNOSIS — D649 Anemia, unspecified: Secondary | ICD-10-CM | POA: Diagnosis present

## 2013-12-15 DIAGNOSIS — Z7982 Long term (current) use of aspirin: Secondary | ICD-10-CM | POA: Diagnosis not present

## 2013-12-15 DIAGNOSIS — I2510 Atherosclerotic heart disease of native coronary artery without angina pectoris: Secondary | ICD-10-CM | POA: Diagnosis present

## 2013-12-15 DIAGNOSIS — R072 Precordial pain: Secondary | ICD-10-CM | POA: Diagnosis not present

## 2013-12-15 DIAGNOSIS — E1129 Type 2 diabetes mellitus with other diabetic kidney complication: Secondary | ICD-10-CM | POA: Diagnosis not present

## 2013-12-15 HISTORY — DX: Presence of prosthetic heart valve: Z95.2

## 2013-12-15 LAB — CBC
HCT: 38.6 % (ref 36.0–46.0)
Hemoglobin: 12.6 g/dL (ref 12.0–15.0)
MCH: 33.2 pg (ref 26.0–34.0)
MCHC: 32.6 g/dL (ref 30.0–36.0)
MCV: 101.6 fL — ABNORMAL HIGH (ref 78.0–100.0)
PLATELETS: 140 10*3/uL — AB (ref 150–400)
RBC: 3.8 MIL/uL — ABNORMAL LOW (ref 3.87–5.11)
RDW: 16.8 % — ABNORMAL HIGH (ref 11.5–15.5)
WBC: 5.3 10*3/uL (ref 4.0–10.5)

## 2013-12-15 LAB — COMPREHENSIVE METABOLIC PANEL
ALBUMIN: 3.1 g/dL — AB (ref 3.5–5.2)
ALT: 39 U/L — ABNORMAL HIGH (ref 0–35)
ANION GAP: 18 — AB (ref 5–15)
AST: 76 U/L — ABNORMAL HIGH (ref 0–37)
Alkaline Phosphatase: 95 U/L (ref 39–117)
BILIRUBIN TOTAL: 0.4 mg/dL (ref 0.3–1.2)
BUN: 16 mg/dL (ref 6–23)
CHLORIDE: 94 meq/L — AB (ref 96–112)
CO2: 28 mEq/L (ref 19–32)
CREATININE: 4.25 mg/dL — AB (ref 0.50–1.10)
Calcium: 8.2 mg/dL — ABNORMAL LOW (ref 8.4–10.5)
GFR calc non Af Amer: 11 mL/min — ABNORMAL LOW (ref >90)
GFR, EST AFRICAN AMERICAN: 12 mL/min — AB (ref >90)
GLUCOSE: 236 mg/dL — AB (ref 70–99)
Potassium: 5.3 mEq/L (ref 3.7–5.3)
Sodium: 140 mEq/L (ref 137–147)
Total Protein: 7.9 g/dL (ref 6.0–8.3)

## 2013-12-15 LAB — CBG MONITORING, ED: Glucose-Capillary: 237 mg/dL — ABNORMAL HIGH (ref 70–99)

## 2013-12-15 LAB — TROPONIN I

## 2013-12-15 LAB — GLUCOSE, CAPILLARY: GLUCOSE-CAPILLARY: 197 mg/dL — AB (ref 70–99)

## 2013-12-15 LAB — I-STAT TROPONIN, ED: TROPONIN I, POC: 0 ng/mL (ref 0.00–0.08)

## 2013-12-15 LAB — PROTIME-INR
INR: 1.65 — ABNORMAL HIGH (ref 0.00–1.49)
PROTHROMBIN TIME: 19.7 s — AB (ref 11.6–15.2)

## 2013-12-15 LAB — MRSA PCR SCREENING: MRSA by PCR: NEGATIVE

## 2013-12-15 MED ORDER — GABAPENTIN 300 MG PO CAPS
300.0000 mg | ORAL_CAPSULE | Freq: Every day | ORAL | Status: DC
  Administered 2013-12-15 – 2013-12-20 (×6): 300 mg via ORAL
  Filled 2013-12-15 (×7): qty 1

## 2013-12-15 MED ORDER — SODIUM CHLORIDE 0.9 % IJ SOLN: 3.0000 mL | Freq: Two times a day (BID) | INTRAMUSCULAR | Status: DC

## 2013-12-15 MED ORDER — NITROGLYCERIN IN D5W 200-5 MCG/ML-% IV SOLN: 0.0000 ug/min | INTRAVENOUS | Status: DC

## 2013-12-15 MED ORDER — HEPARIN (PORCINE) IN NACL 100-0.45 UNIT/ML-% IJ SOLN
850.0000 [IU]/h | INTRAMUSCULAR | Status: DC
  Administered 2013-12-15: 850 [IU]/h via INTRAVENOUS
  Filled 2013-12-15 (×2): qty 250

## 2013-12-15 MED ORDER — SODIUM CHLORIDE 0.9 % IV SOLN
INTRAVENOUS | Status: DC
  Administered 2013-12-16: 10 mL/h via INTRAVENOUS

## 2013-12-15 MED ORDER — ALBUTEROL SULFATE (2.5 MG/3ML) 0.083% IN NEBU: 2.5000 mg | INHALATION_SOLUTION | RESPIRATORY_TRACT | Status: DC | PRN

## 2013-12-15 MED ORDER — ONDANSETRON HCL 4 MG PO TABS
4.0000 mg | ORAL_TABLET | Freq: Four times a day (QID) | ORAL | Status: DC | PRN
Start: 2013-12-15 — End: 2013-12-21

## 2013-12-15 MED ORDER — DULOXETINE HCL 60 MG PO CPEP
60.0000 mg | ORAL_CAPSULE | Freq: Every day | ORAL | Status: DC
  Administered 2013-12-15 – 2013-12-19 (×2): 60 mg via ORAL
  Filled 2013-12-15 (×7): qty 1

## 2013-12-15 MED ORDER — IPRATROPIUM BROMIDE 0.02 % IN SOLN
0.5000 mg | Freq: Four times a day (QID) | RESPIRATORY_TRACT | Status: DC
  Administered 2013-12-15 – 2013-12-16 (×2): 0.5 mg via RESPIRATORY_TRACT
  Filled 2013-12-15 (×3): qty 2.5

## 2013-12-15 MED ORDER — INSULIN ASPART 100 UNIT/ML ~~LOC~~ SOLN: 0.0000 [IU] | Freq: Every day | SUBCUTANEOUS | Status: DC

## 2013-12-15 MED ORDER — ZOLPIDEM TARTRATE 5 MG PO TABS
5.0000 mg | ORAL_TABLET | Freq: Every evening | ORAL | Status: DC | PRN
  Administered 2013-12-16: 5 mg via ORAL
  Filled 2013-12-15 (×2): qty 1

## 2013-12-15 MED ORDER — INSULIN ASPART 100 UNIT/ML ~~LOC~~ SOLN
0.0000 [IU] | Freq: Three times a day (TID) | SUBCUTANEOUS | Status: DC
  Administered 2013-12-16 – 2013-12-17 (×2): 5 [IU] via SUBCUTANEOUS
  Administered 2013-12-18 – 2013-12-21 (×5): 3 [IU] via SUBCUTANEOUS

## 2013-12-15 MED ORDER — CALCIUM CARBONATE ANTACID 500 MG PO CHEW
1.0000 | CHEWABLE_TABLET | Freq: Every day | ORAL | Status: DC
  Administered 2013-12-16 – 2013-12-21 (×6): 200 mg via ORAL
  Filled 2013-12-15 (×6): qty 1

## 2013-12-15 MED ORDER — SODIUM CHLORIDE 0.9 % IV SOLN: 250.0000 mL | INTRAVENOUS | Status: DC | PRN

## 2013-12-15 MED ORDER — SUCROFERRIC OXYHYDROXIDE 500 MG PO CHEW
500.0000 mg | CHEWABLE_TABLET | Freq: Three times a day (TID) | ORAL | Status: DC
  Filled 2013-12-15 (×4): qty 1

## 2013-12-15 MED ORDER — HYDROMORPHONE HCL 1 MG/ML IJ SOLN
1.0000 mg | INTRAMUSCULAR | Status: DC | PRN
  Administered 2013-12-16: 04:00:00 1 mg via INTRAVENOUS
  Filled 2013-12-15: qty 1

## 2013-12-15 MED ORDER — TRAMADOL HCL 50 MG PO TABS
50.0000 mg | ORAL_TABLET | Freq: Two times a day (BID) | ORAL | Status: DC | PRN
  Administered 2013-12-15 – 2013-12-20 (×3): 50 mg via ORAL
  Filled 2013-12-15 (×3): qty 1

## 2013-12-15 MED ORDER — AMLODIPINE BESYLATE 10 MG PO TABS
10.0000 mg | ORAL_TABLET | Freq: Every day | ORAL | Status: DC
  Administered 2013-12-16: 10 mg via ORAL
  Filled 2013-12-15 (×6): qty 1

## 2013-12-15 MED ORDER — ALBUTEROL SULFATE (2.5 MG/3ML) 0.083% IN NEBU
2.5000 mg | INHALATION_SOLUTION | Freq: Four times a day (QID) | RESPIRATORY_TRACT | Status: DC
  Administered 2013-12-15 – 2013-12-16 (×2): 2.5 mg via RESPIRATORY_TRACT
  Filled 2013-12-15 (×3): qty 3

## 2013-12-15 MED ORDER — LEVOTHYROXINE SODIUM 150 MCG PO TABS
150.0000 ug | ORAL_TABLET | Freq: Every day | ORAL | Status: DC
  Administered 2013-12-16 – 2013-12-21 (×6): 150 ug via ORAL
  Filled 2013-12-15 (×7): qty 1

## 2013-12-15 MED ORDER — HEPARIN BOLUS VIA INFUSION
4000.0000 [IU] | Freq: Once | INTRAVENOUS | Status: AC
  Administered 2013-12-15: 4000 [IU] via INTRAVENOUS
  Filled 2013-12-15: qty 4000

## 2013-12-15 MED ORDER — HYDRALAZINE HCL 25 MG PO TABS
25.0000 mg | ORAL_TABLET | Freq: Three times a day (TID) | ORAL | Status: DC
  Administered 2013-12-16 – 2013-12-19 (×3): 25 mg via ORAL
  Filled 2013-12-15 (×19): qty 1

## 2013-12-15 MED ORDER — SODIUM CHLORIDE 0.9 % IJ SOLN: 3.0000 mL | INTRAMUSCULAR | Status: DC | PRN

## 2013-12-15 MED ORDER — TACROLIMUS 1 MG PO CAPS
3.0000 mg | ORAL_CAPSULE | Freq: Two times a day (BID) | ORAL | Status: DC
  Administered 2013-12-15 – 2013-12-21 (×12): 3 mg via ORAL
  Filled 2013-12-15 (×13): qty 3

## 2013-12-15 MED ORDER — PROMETHAZINE HCL 25 MG PO TABS
12.5000 mg | ORAL_TABLET | ORAL | Status: DC | PRN
  Filled 2013-12-15: qty 1

## 2013-12-15 MED ORDER — METOPROLOL TARTRATE 50 MG PO TABS
50.0000 mg | ORAL_TABLET | Freq: Two times a day (BID) | ORAL | Status: DC
  Administered 2013-12-16 – 2013-12-21 (×9): 50 mg via ORAL
  Filled 2013-12-15 (×14): qty 1

## 2013-12-15 MED ORDER — ADULT MULTIVITAMIN W/MINERALS CH
1.0000 | ORAL_TABLET | Freq: Every day | ORAL | Status: DC
  Administered 2013-12-15 – 2013-12-20 (×5): 1 via ORAL
  Filled 2013-12-15 (×7): qty 1

## 2013-12-15 MED ORDER — SODIUM CHLORIDE 0.9 % IJ SOLN
3.0000 mL | Freq: Two times a day (BID) | INTRAMUSCULAR | Status: DC
  Administered 2013-12-16: 3 mL via INTRAVENOUS

## 2013-12-15 MED ORDER — ONDANSETRON HCL 4 MG/2ML IJ SOLN
4.0000 mg | Freq: Four times a day (QID) | INTRAMUSCULAR | Status: DC | PRN
  Administered 2013-12-16: 06:00:00 4 mg via INTRAVENOUS
  Filled 2013-12-15 (×2): qty 2

## 2013-12-15 MED ORDER — ASPIRIN 81 MG PO CHEW
81.0000 mg | CHEWABLE_TABLET | ORAL | Status: AC
  Administered 2013-12-16: 81 mg via ORAL
  Filled 2013-12-15: qty 1

## 2013-12-15 MED ORDER — HEPARIN (PORCINE) IN NACL 100-0.45 UNIT/ML-% IJ SOLN: 12.0000 [IU]/kg/h | INTRAMUSCULAR | Status: DC

## 2013-12-15 MED ORDER — ASPIRIN EC 81 MG PO TBEC: 81.0000 mg | DELAYED_RELEASE_TABLET | Freq: Every day | ORAL | Status: DC

## 2013-12-15 MED ORDER — ACETAMINOPHEN 650 MG RE SUPP: 650.0000 mg | Freq: Four times a day (QID) | RECTAL | Status: DC | PRN

## 2013-12-15 MED ORDER — LORAZEPAM 0.5 MG PO TABS
0.5000 mg | ORAL_TABLET | Freq: Every evening | ORAL | Status: DC | PRN
  Administered 2013-12-17 – 2013-12-20 (×4): 0.5 mg via ORAL
  Filled 2013-12-15 (×4): qty 1

## 2013-12-15 MED ORDER — NITROGLYCERIN IN D5W 200-5 MCG/ML-% IV SOLN
10.0000 ug/min | INTRAVENOUS | Status: DC
  Administered 2013-12-15: 10 ug/min via INTRAVENOUS
  Filled 2013-12-15: qty 250

## 2013-12-15 MED ORDER — ASPIRIN EC 81 MG PO TBEC
81.0000 mg | DELAYED_RELEASE_TABLET | Freq: Every day | ORAL | Status: DC
  Administered 2013-12-17 – 2013-12-21 (×5): 81 mg via ORAL
  Filled 2013-12-15 (×5): qty 1

## 2013-12-15 MED ORDER — ACETAMINOPHEN 325 MG PO TABS: 650.0000 mg | ORAL_TABLET | Freq: Four times a day (QID) | ORAL | Status: DC | PRN

## 2013-12-15 MED ORDER — CINACALCET HCL 30 MG PO TABS
30.0000 mg | ORAL_TABLET | Freq: Every day | ORAL | Status: DC
  Administered 2013-12-16 – 2013-12-21 (×6): 30 mg via ORAL
  Filled 2013-12-15 (×8): qty 1

## 2013-12-15 NOTE — ED Notes (Signed)
Attempted report x1. 

## 2013-12-15 NOTE — ED Notes (Addendum)
Dr. Evette Georges assistant at bedside.

## 2013-12-15 NOTE — Consult Note (Signed)
Primary Cardiologist: Dr. [name]   Chief Complaint: Chest Pain   HPI: The patient is a 58 year old, African American female, followed by Dr. [name], with end-stage renal disease, who has been on chronic hemodialysis (Tue, Thr, Sat.).  Her primary nephrologist is Dr. [employer]. She is also s/p liver transplantation at [facility] in 2011 for hepatitis C.She was admitted in January of this year with increasing chest pain. A dobutamine echo study suggested severe AS with an ejection fraction of 45%. She ultimately underwent cardiac catheterization by Dr. [name] which revealed mild CAD with 50% narrowing and moderately-severe aortic stenosis with aortic valve area in the range of 0.8-1.2. She ultimately underwent aortic valve replacement with mechanical aortic valve and single vessel LIMA to LAD CABG revascularization surgery on 03/15/2013 by Dr. [name]. She was placed on Coumadin for anticoagulation and her INR is followed in our office. She had a repeat 2D echo 04/10/2013 which demonstrated improved systolic function with an EF of 55% with hypokinesis of the basal-inferior myocardium. Grade 2 diastolic dysfunction was noted. Visualization of her aortic valve revealed a properly functioning mechanical prosthesis. Her other medical problems include diabetes and ongoing tobacco abuse.  She was recently admitted to [facility] on September 28 for acute on chronic abdominal pain, constipation, nausea and vomiting, which has been a recurrent issue since undergoing liver transplantation. Per records in "Care Everywhere", GI was consulted and recommended Remeron qhs for possible contribution of IBS. She was administered Golytely with large BM x 3 and significant improvement in her nausea and abdominal pain. She was discharged with instructions to continue an aggressive home bowel regimen and was given a prescription for Golytely to use PRN for severe constipation.   During that admission, she was also noted to  have a subtherapeutic INR on arrival at 1.7. Subsequently, she was placed on IV heparin and continued on her home dose of Coumadin. Her INR increased to supratherapeutic levels after which Coumadin was held. Her INR continued to be monitored and stabilized. She was discharged with instructions to reduce her home dose of Coumadin to 2.5 mg daily  and was instructed to follow-up in our Coumadin clinic.  Today, she presented to the [facility] emergency department with complaints of chest pain. She reports that she first developed chest discomfort 1 week ago during hemodialysis. At that time, she reported to [facility] but was not admitted and was released home the same day. Today, she had recurrent chest discomfort during hemodialysis. She reports that she became hypotensive during her session. Per records, she was administered IV fluids and her chest pain resolved with normalization of her blood pressure. However, she went home and developed recurrent substernal to left sided chest pressure and tightness. She describes the sensation as a feeling of "an elephant sitting on my chest". She states, "I felt like I was having a heart attack". She reports associated acute onset dyspnea, nausea and diaphoresis. No vomiting, syncope/near-syncope. She was brought to the ER and was placed on IV nitroglycerin. She notes considerable improvement in her discomfort from an 8/10 to a 4/10. Her EKG demonstrates normal sinus rhythm with new T abnormalities in the inferior leads. Her blood pressure is stable in the 563J systolic. Her heart rate is in the 70s. STAT point of care troponin in the ED is negative. Her INR is subtherapeutic at 1.65. She reports full medication compliance. Unfortunately, she continues to smoke, on average one pack every 2-3 days.   Her daughter, who is  by her bedside, is also requesting that she undergo a sleep study, as she has been observed to have frequent apneic spells during sleep. Her daughter  also notes that she snores frequently. The patient herself also notes that she often feels fatigued throughout the day and does not feel well rested after a nights sleep.   Past Medical History  Diagnosis Date  . 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 Chronic back pain   . CAD (coronary artery disease)   . Obesity   . Peripheral vascular disease hands and legs  . Anxiety   . Asthma   . GERD (gastroesophageal reflux disease)     takes Omeprazole daily  . Chronic constipation     takes MIralax and Colace daily  . Anemia     takes Folic Acid daily  . Hypothyroidism     takes Synthroid daily  . Depression     takes Cymbalta for "severe" depression  . Neuromuscular disorder     carpal tunnel in right hand  . Hypertension     takes Metoprolol and Lisinopril daily, sees Dr Bea Graff  . COPD (chronic obstructive pulmonary disease)   . Pneumonia     "today and several times before" (08/30/2012)  . Chronic bronchitis     "q yr w/season changes" (08/30/2012)  . Type II diabetes mellitus     Levemir 2units daily if > 150  . History of blood transfusion     "several" (08/30/2012)  . Hepatitis C   . Migraine     "last migraine was in 2013" (08/30/2012)  . Headache     "at least monthly" (08/30/2012)  . Arthritis     "left hand, back" (08/30/2012)  . End stage renal disease on dialysis 02/27/2011    Kidneys shut down at time of liver transplant in Sept 2011 at [facility] in [place], she has been on HD ever since.  Dialyzes at [facility] HD on TTS schedule.  Had L forearm graft used 10 months then removed Dec 2012 due to suspected infection.  A right upper arm AVG was placed Dec 2012 but she developed steal symptoms acutely and it was ligated the same day.  Never had an AV fistula due to small veins.  Now has L thigh AVG put in Jan 2013, has not clotted to date.    [name] CAD (coronary artery disease) Jan. 2015    Cath: 20% LAD,  50% D1; s/p LIMA-LAD  . Aortic stenosis   . Tobacco abuse   . S/P aortic valve replacement 03/15/13    Mechanical     Past Surgical History  Procedure Laterality Date  . Liver transplant  10/25/2009    sees Dr Ferol Luz 1 every 6 months, saw last in Dec 2013. [name] Coord [phone]  . Small intestine surgery  90's  . Thrombectomy    . Arteriovenous graft placement Left 10/03/10     forearm  . Avgg removal  12/23/2010    Procedure: REMOVAL OF ARTERIOVENOUS GORETEX GRAFT ([place]);  Surgeon: Elam Dutch, MD;  Location: [place];  Service: Vascular;  Laterality: Left;  procedure started _0 -1852  . Insertion of dialysis catheter  12/23/2010    Procedure: INSERTION OF DIALYSIS CATHETER;  Surgeon: Elam Dutch, MD;  Location: [place];  Service: Vascular;  Laterality: Right;  Right Internal Jugular 28cm dialysis catheter insertion procedure time  3154-0086   . Cholecystectomy  1993  . Cystoscopy  1990's  . Spinal growth rods  2010    "put 2 metal rods in my back; they had detetriorated" (08/30/2012)  . Av fistula placement  01/29/2011    Procedure: INSERTION OF ARTERIOVENOUS (AV) GORE-TEX GRAFT ARM;  Surgeon: Elam Dutch, MD;  Location: [place];  Service: Vascular;  Laterality: Right;  . Av fistula placement  03/10/2011    Procedure: INSERTION OF ARTERIOVENOUS (AV) GORE-TEX GRAFT THIGH;  Surgeon: Elam Dutch, MD;  Location: [place];  Service: Vascular;  Laterality: Left;  . Tubal ligation  1990's  . Cardiac catheterization  2014  . Aortic valve replacement N/A 03/15/2013    AVR; Surgeon: Ivin Poot, MD;  Location: [facility] OR; Open Heart Surgery;  1m[employer] mechanical prosthesis, top hat valve  . Coronary artery bypass graft N/A 03/15/2013    Procedure: CORONARY ARTERY BYPASS GRAFTING (CABG) times one using left internal mammary artery.;  Surgeon: PIvin Poot MD;  [place]  Service: Open Heart Surgery;  Laterality: N/A;  POSS CABG X 1  . Intraoperative  transesophageal echocardiogram N/A 03/15/2013    Procedure: INTRAOPERATIVE TRANSESOPHAGEAL ECHOCARDIOGRAM;  Surgeon: PIvin Poot MD;  [place]  Service: Open Heart Surgery;  Laterality: N/A;    Family History  Problem Relation Age of Onset  . Cancer Mother   . Diabetes Mother   . Hypertension Mother   . Stroke Mother   . Cancer Father   . Anesthesia problems Neg Hx   . Hypotension Neg Hx   . Malignant hyperthermia Neg Hx   . Pseudochol deficiency Neg Hx    Social History:  reports that she quit smoking about 9 months ago. Her smoking use included Cigarettes. She has a 30 pack-year smoking history. She has never used smokeless tobacco. She reports that she drinks alcohol. She reports that she does not use illicit drugs.  Allergies:  Allergies  Allergen Reactions  . Acetaminophen Other (See Comments)    Liver transplant recipient   . Codeine Itching  . Mirtazapine Other (See Comments)    hallucination  . Morphine Itching    Prior to Admission medications   Medication Sig Start Date End Date Taking? Authorizing Provider  albuterol (PROVENTIL,VENTOLIN) 90 MCG/ACT inhaler Inhale 1-2 puffs into the lungs every 6 (six) hours as needed for wheezing or shortness of breath.     Historical Provider, MD  amLODipine (NORVASC) 10 MG tablet Take 10 mg by mouth daily.    Historical Provider, MD  aspirin EC 81 MG tablet Take 81 mg by mouth daily.      Historical Provider, MD  calcium carbonate (TUMS - DOSED IN MG ELEMENTAL CALCIUM) 500 MG chewable tablet Chew 1 tablet by mouth daily.    Historical Provider, MD  cinacalcet (SENSIPAR) 30 MG tablet Take 30 mg by mouth daily.    Historical Provider, MD  DULoxetine (CYMBALTA) 60 MG capsule Take 60 mg by mouth at bedtime.     Historical Provider, MD  gabapentin (NEURONTIN) 300 MG capsule Take 300 mg by mouth at bedtime.     Historical Provider, MD  hydrALAZINE (APRESOLINE) 25 MG tablet Take 25 mg by mouth 3 (three) times daily.     Historical Provider, MD  insulin detemir (LEVEMIR) 100 UNIT/ML injection Inject 5 Units into the skin at bedtime.    Historical Provider, MD  levothyroxine (SYNTHROID, LEVOTHROID) 150 MCG tablet Take 150 mcg by mouth daily before breakfast.  Historical Provider, MD  lidocaine (LIDODERM) 5 % Place 1 patch onto the skin daily. Remove & Discard patch within 12 hours or as directed by MD    Historical Provider, MD  LORazepam (ATIVAN) 0.5 MG tablet Take 0.5 mg by mouth at bedtime as needed for sleep.  02/08/13   Historical Provider, MD  metoprolol (LOPRESSOR) 50 MG tablet Take 50 mg by mouth 2 (two) times daily.    Historical Provider, MD  Multiple Vitamin (MULTIVITAMIN WITH MINERALS) TABS tablet Take 1 tablet by mouth at bedtime.     Historical Provider, MD  promethazine (PHENERGAN) 12.5 MG tablet Take 12.5 mg by mouth every 4 (four) hours as needed for nausea.  02/09/13   Historical Provider, MD  promethazine (PHENERGAN) 25 MG tablet Take 1 tablet (25 mg total) by mouth every 6 (six) hours as needed for nausea or vomiting. 11/03/13   Jarrett Soho Muthersbaugh, PA-C  tacrolimus (PROGRAF) 1 MG capsule Take 5 mg by mouth 2 (two) times daily.     Historical Provider, MD  traMADol (ULTRAM) 50 MG tablet Take 50 mg by mouth every 12 (twelve) hours as needed for moderate pain.  02/09/13   Historical Provider, MD  VELPHORO 500 MG chewable tablet Chew 1 tablet by mouth 3 (three) times daily with meals.  06/10/13   Historical Provider, MD  warfarin (COUMADIN) 2 MG tablet Take 1.5 to 2 tablets by mouth daily as directed by coumadin clinic 12/14/13   [name], MD     Results for orders placed or performed during the hospital encounter of 12/15/13 (from the past 48 hour(s))  Comprehensive metabolic panel     Status: Abnormal   Collection Time: 12/15/13  1:42 PM  Result Value Ref Range   Sodium 140 137 - 147 mEq/L   Potassium 5.3 3.7 - 5.3 mEq/L    Comment: HEMOLYSIS AT THIS LEVEL MAY AFFECT RESULT   Chloride  94 (L) 96 - 112 mEq/L   CO2 28 19 - 32 mEq/L   Glucose, Bld 236 (H) 70 - 99 mg/dL   BUN 16 6 - 23 mg/dL   Creatinine, Ser 4.25 (H) 0.50 - 1.10 mg/dL   Calcium 8.2 (L) 8.4 - 10.5 mg/dL   Total Protein 7.9 6.0 - 8.3 g/dL   Albumin 3.1 (L) 3.5 - 5.2 g/dL   AST 76 (H) 0 - 37 U/L    Comment: HEMOLYSIS AT THIS LEVEL MAY AFFECT RESULT   ALT 39 (H) 0 - 35 U/L    Comment: HEMOLYSIS AT THIS LEVEL MAY AFFECT RESULT   Alkaline Phosphatase 95 39 - 117 U/L    Comment: HEMOLYSIS AT THIS LEVEL MAY AFFECT RESULT   Total Bilirubin 0.4 0.3 - 1.2 mg/dL   GFR calc non Af Amer 11 (L) >90 mL/min   GFR calc Af Amer 12 (L) >90 mL/min    Comment: (NOTE) The eGFR has been calculated using the CKD EPI equation. This calculation has not been validated in all clinical situations. eGFR's persistently <90 mL/min signify possible Chronic Kidney Disease.    Anion gap 18 (H) 5 - 15  CBC     Status: Abnormal   Collection Time: 12/15/13  1:42 PM  Result Value Ref Range   WBC 5.3 4.0 - 10.5 K/uL   RBC 3.80 (L) 3.87 - 5.11 MIL/uL   Hemoglobin 12.6 12.0 - 15.0 g/dL   HCT 38.6 36.0 - 46.0 %   MCV 101.6 (H) 78.0 - 100.0 fL   MCH 33.2  26.0 - 34.0 pg   MCHC 32.6 30.0 - 36.0 g/dL   RDW 16.8 (H) 11.5 - 15.5 %   Platelets 140 (L) 150 - 400 K/uL  Protime-INR     Status: Abnormal   Collection Time: 12/15/13  1:42 PM  Result Value Ref Range   Prothrombin Time 19.7 (H) 11.6 - 15.2 seconds   INR 1.65 (H) 0.00 - 1.49  CBG monitoring, ED     Status: Abnormal   Collection Time: 12/15/13  1:54 PM  Result Value Ref Range   Glucose-Capillary 237 (H) 70 - 99 mg/dL  I-stat troponin, ED (not at [facility])     Status: None   Collection Time: 12/15/13  1:57 PM  Result Value Ref Range   Troponin i, poc 0.00 0.00 - 0.08 ng/mL   Comment 3            Comment: Due to the release kinetics of cTnI, a negative result within the first hours of the onset of symptoms does not rule out myocardial infarction with certainty. If myocardial  infarction is still suspected, repeat the test at appropriate intervals.    Dg Chest Portable 1 View  12/15/2013   EXAM: PORTABLE CHEST - 1 VIEW  COMPARISON:  None.  FINDINGS: The heart size and mediastinal contours are within normal limits. Both lungs are clear. The visualized skeletal structures are unremarkable.  IMPRESSION: No active disease.   Electronically Signed   By: Franchot Gallo M.D.   On: 12/15/2013 14:27    Review of Systems  Respiratory: Positive for shortness of breath.   Cardiovascular: Positive for chest pain and palpitations. Negative for orthopnea, leg swelling and PND.  Gastrointestinal: Positive for nausea. Negative for vomiting.  Neurological: Negative for dizziness and loss of consciousness.  All other systems reviewed and are negative.   Blood pressure 125/57, pulse 74, temperature 98.2 F (36.8 C), temperature source Oral, resp. rate 16, height _0  (1.626 m), weight 160 lb (72.576 kg), SpO2 98 %. Physical Exam  Constitutional: She is oriented to person, place, and time. She appears well-developed and well-nourished. No distress.  Neck: No JVD present. Carotid bruit is not present.  Cardiovascular: Normal rate and regular rhythm.  Exam reveals no friction rub.   Murmur (Chris mechanical heart valve sounds present) heard. Pulses:      Radial pulses are 2+ on the right side, and 2+ on the left side.       Dorsalis pedis pulses are 2+ on the right side, and 2+ on the left side.  Respiratory: Effort normal and breath sounds normal. No respiratory distress. She has no wheezes. She has no rales.  GI: Soft. Bowel sounds are normal. She exhibits no distension and no mass. There is tenderness (mild mid epigastric area).  Musculoskeletal: Normal range of motion. She exhibits no edema.  Neurological: She is alert and oriented to person, place, and time.  Skin: Skin is warm and dry. She is not diaphoretic.  Psychiatric: She has a normal mood and affect. Her behavior is  normal.     Assessment/Plan Active Problems:   CAD- s/p LIMA-LAD Feb. 2015   Aortic stenosis, moderate-severe at cath Jan 2015. s/p Mechanical AVR 03/2013   Tobacco abuse   Chronic anticoagulation w/ coumadin, goal INR 2.5-3.0 w/ AVR   DM type 2 (diabetes mellitus, type 2)    1. Chest Pain: Known history of CAD status post single-vessel bypass with LIMA to the LAD in February 2015. Also with type 2  diabetes and ongoing tobacco abuse. Symptoms of substernal to left sided chest pressure/tightness occurring at rest and relieved with nitroglycerin is concerning for unstable angina. Her EKG demonstrates new T-wave abnormalities in the inferior leads not seen on prior EKG in May 2015. Initial point-of-care troponin is negative. Given history, risk factors and symptomatology, recommend admitting for observation. Continue to cycle cardiac enzymes. Will plan right femoral LHC in the am. Will place on IV heparin. No coumadin given INR is 1.6 and needs to be <1.8 for cath. Continue IV nitroglycerin as long as blood pressure can tolerate. Continue Metoprolol. No statin given history of liver transplant and mildly elevated LFTs.  2. Status post mechanical aortic valve replacement: INR subtherapeutic at 1.65. INR goal for  mechanical aortic valve is 2.5-3.5. Will place on IV heparin per pharmacy. Will hold Coumadin for now in anticipation of LHC.  3. Tobacco abuse: smoking cessation strongly advised.  4. Diabetes: glucose on arrival was elevated at 236, however patient reports this was shortly after eating lunch. Monitor closely and continue home insulin.  5. ? Sleep Apnea: family notes frequent snoring and reports observed apneic spells. Recommend out patient sleep study and f/u with Dr. [name].   Given multiple medical problems, will ask TRH to admit.    SIMMONS, BRITTAINY 12/15/2013, 5:03 PM  Patient examined chart reviewed.  36 black female with multiple medical issues including liver  transplant, CRF on dialysis CAD with CABG/AVR with LIMA to LAD and mechanical valve  Seen in [place] a week ago with chest pain and hypotension with dialysis and d/c.  She thinks Dr Justin Mend needs to liberalize her weight.  Recurrent SSCP with hypotension and dyspnea today.  ECG with LVH no acute ST changes.  , negative Troponin and CXR with NAD.  Improved with fluid.  With recurrent episodes favor diagnostic cath in am.  Discussed with daughters and they agree Her INR is subRx so can cover with heparin and stop 1 hr before cath.  She has failed grafts in both arms and a functioning fistula in Left femoral so cath will need to be done from right femoral.  Check d dimer and if cath shows patent lima and no new disease consider CT / V/Q to r/o PE given subRx INR on admission  AVR sounds normal on exam with click and SEM but no AR murmur  Jenkins Rouge

## 2013-12-15 NOTE — Progress Notes (Signed)
ANTICOAGULATION CONSULT NOTE - Initial Consult  Pharmacy Consult for Heparin Indication: Chest pain/Mechanical Heart Valve  Allergies  Allergen Reactions  . Acetaminophen Other (See Comments)    Liver transplant recipient   . Codeine Itching  . Mirtazapine Other (See Comments)    hallucination  . Morphine Itching    Patient Measurements: Height: 5\' 4"  (162.6 cm) Weight: 160 lb (72.576 kg) IBW/kg (Calculated) : 54.7 Heparin Dosing Weight: 70 kg  Vital Signs: Temp: 98.2 F (36.8 C) (11/05 1333) Temp Source: Oral (11/05 1333) BP: 125/57 mmHg (11/05 1630) Pulse Rate: 74 (11/05 1630)  Labs:  Recent Labs  12/15/13 1342  HGB 12.6  HCT 38.6  PLT 140*  LABPROT 19.7*  INR 1.65*  CREATININE 4.25*    Estimated Creatinine Clearance: 13.9 mL/min (by C-G formula based on Cr of 4.25).   Medical History: Past Medical History  Diagnosis Date  . 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 Chronic back pain   . CAD (coronary artery disease)   . Obesity   . Peripheral vascular disease hands and legs  . Anxiety   . Asthma   . GERD (gastroesophageal reflux disease)     takes Omeprazole daily  . Chronic constipation     takes MIralax and Colace daily  . Anemia     takes Folic Acid daily  . Hypothyroidism     takes Synthroid daily  . Depression     takes Cymbalta for "severe" depression  . Neuromuscular disorder     carpal tunnel in right hand  . Hypertension     takes Metoprolol and Lisinopril daily, sees Dr Bea Graff  . COPD (chronic obstructive pulmonary disease)   . Pneumonia     "today and several times before" (08/30/2012)  . Chronic bronchitis     "q yr w/season changes" (08/30/2012)  . Type II diabetes mellitus     Levemir 2units daily if > 150  . History of blood transfusion     "several" (08/30/2012)  . Hepatitis C   . Migraine     "last migraine was in 2013" (08/30/2012)  . Headache     "at least monthly" (08/30/2012)  . Arthritis     "left hand, back" (08/30/2012)  . End stage renal disease on dialysis 02/27/2011    Kidneys shut down at time of liver transplant in Sept 2011 at [facility] in [place], she has been on HD ever since.  Dialyzes at [facility] HD on TTS schedule.  Had L forearm graft used 10 months then removed Dec 2012 due to suspected infection.  A right upper arm AVG was placed Dec 2012 but she developed steal symptoms acutely and it was ligated the same day.  Never had an AV fistula due to small veins.  Now has L thigh AVG put in Jan 2013, has not clotted to date.    [name] CAD (coronary artery disease) Jan. 2015    Cath: 20% LAD, 50% D1  . Aortic stenosis   . Tobacco abuse     Medications:  Scheduled:   Infusions:  . heparin 850 Units/hr (12/15/13 1703)  . nitroGLYCERIN 10 mcg/min (12/15/13 1706)    Assessment: 58 yo f who presented to the ED on 11/4 for chest pain.  Patient has a mechanical heart valve with a subtherapeutic INR on admission (on coumadin PTA).  Pharmacy is consulted to begin heparin  for mechanical heart valve and chest pain.  Will give a 4,000 unit bolus x 1 then begin a heparin infusion at 850 units/hr.  8-hr HL @ 0100.  Will aim for the higher end of therapeutic range (around 0.6).  Hgb  12.6, plts 140, INR 1.65.  Goal of Therapy:  Heparin level 0.3-0.7 units/ml Monitor platelets by anticoagulation protocol: Yes   Plan:  Heparin bolus 4,000 units x 1 Heparin infusion at 850 units/hr 8-hr HL @ 0100 Aim for higher end of therapeutic range Monitor hgb/plts, s/s of bleeding, clinical course of patient  Josh Nicolosi L. Nicole Kindred, PharmD Clinical Pharmacy Resident Pager: [phone] 12/15/2013 5:33 PM

## 2013-12-15 NOTE — ED Notes (Signed)
Pt states chest pain has resolved with 30 mcg of nitro. Spoke with MD. MD wanting to order heparin at this time due to non-therapeutic INR pt on coumadin.

## 2013-12-15 NOTE — ED Notes (Signed)
Pt went to dialysis today. During dialysis her BP was low but treatment was completed, pt felt dizzy was given fluid to control BP. Pt went home. Pt started having midsternal 10/10 chest pain, HA. Pt has tingling in R arm which is chronic. Hx liver transplant, Heart valve replacement, HTN, DM, renal failure. Graft in L leg. BP in right arm only.97% on room air, denies SOB, denies N/V. Pt received 1  Nitro and 324 ASA. BP 682 systolic, then 574/93. Now pain 8/10. ST elevation for EMS. Alert and oriented

## 2013-12-15 NOTE — H&P (Deleted)
Primary Cardiologist: Dr. [name]   Chief Complaint: Chest Pain   HPI: The patient is a 58 year old, African American female, followed by Dr. [name], with end-stage renal disease, who has been on chronic hemodialysis (Tue, Thr, Sat.).  Her primary nephrologist is Dr. [employer]. She is also s/p liver transplantation at [facility] in 2011 for hepatitis C.She was admitted in January of this year with increasing chest pain. A dobutamine echo study suggested severe AS with an ejection fraction of 45%. She ultimately underwent cardiac catheterization by Dr. [name] which revealed mild CAD with 50% narrowing and moderately-severe aortic stenosis with aortic valve area in the range of 0.8-1.2. She ultimately underwent aortic valve replacement with mechanical aortic valve and single vessel LIMA to LAD CABG revascularization surgery on 03/15/2013 by Dr. [name]. She was placed on Coumadin for anticoagulation and her INR is followed in our office. She had a repeat 2D echo 04/10/2013 which demonstrated improved systolic function with an EF of 55% with hypokinesis of the basal-inferior myocardium. Grade 2 diastolic dysfunction was noted. Visualization of her aortic valve revealed a properly functioning mechanical prosthesis. Her other medical problems include diabetes and ongoing tobacco abuse.  She was recently admitted to [facility] on September 28 for acute on chronic abdominal pain, constipation, nausea and vomiting, which has been a recurrent issue since undergoing liver transplantation. Per records in "Care Everywhere", GI was consulted and recommended Remeron qhs for possible contribution of IBS. She was administered Golytely with large BM x 3 and significant improvement in her nausea and abdominal pain. She was discharged with instructions to continue an aggressive home bowel regimen and was given a prescription for Golytely to use PRN for severe constipation.   During that admission, she was also noted to  have a subtherapeutic INR on arrival at 1.7. Subsequently, she was placed on IV heparin and continued on her home dose of Coumadin. Her INR increased to supratherapeutic levels after which Coumadin was held. Her INR continued to be monitored and stabilized. She was discharged with instructions to reduce her home dose of Coumadin to 2.5 mg daily  and was instructed to follow-up in our Coumadin clinic.  Today, she presented to the [facility] emergency department with complaints of chest pain. She reports that she first developed chest discomfort 1 week ago during hemodialysis. At that time, she reported to [facility] but was not admitted and was released home the same day. Today, she had recurrent chest discomfort during hemodialysis. She reports that she became hypotensive during her session. Per records, she was administered IV fluids and her chest pain resolved with normalization of her blood pressure. However, she went home and developed recurrent substernal to left sided chest pressure and tightness. She describes the sensation as a feeling of "an elephant sitting on my chest". She states, "I felt like I was having a heart attack". She reports associated acute onset dyspnea, nausea and diaphoresis. No vomiting, syncope/near-syncope. She was brought to the ER and was placed on IV nitroglycerin. She notes considerable improvement in her discomfort from an 8/10 to a 4/10. Her EKG demonstrates normal sinus rhythm with new T abnormalities in the inferior leads. Her blood pressure is stable in the 166M systolic. Her heart rate is in the 70s. STAT point of care troponin in the ED is negative. Her INR is subtherapeutic at 1.65. She reports full medication compliance. Unfortunately, she continues to smoke, on average one pack every 2-3 days.   Her daughter, who  is by her bedside, is also requesting that she undergo a sleep study, as she has been observed to have frequent apneic spells during sleep. Her daughter  also notes that she snores frequently. The patient herself also notes that she often feels fatigued throughout the day and does not feel well rested after a nights sleep.   Past Medical History  Diagnosis Date  . 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 Chronic back pain   . CAD (coronary artery disease)   . Obesity   . Peripheral vascular disease hands and legs  . Anxiety   . Asthma   . GERD (gastroesophageal reflux disease)     takes Omeprazole daily  . Chronic constipation     takes MIralax and Colace daily  . Anemia     takes Folic Acid daily  . Hypothyroidism     takes Synthroid daily  . Depression     takes Cymbalta for "severe" depression  . Neuromuscular disorder     carpal tunnel in right hand  . Hypertension     takes Metoprolol and Lisinopril daily, sees Dr Bea Graff  . COPD (chronic obstructive pulmonary disease)   . Pneumonia     "today and several times before" (08/30/2012)  . Chronic bronchitis     "q yr w/season changes" (08/30/2012)  . Type II diabetes mellitus     Levemir 2units daily if > 150  . History of blood transfusion     "several" (08/30/2012)  . Hepatitis C   . Migraine     "last migraine was in 2013" (08/30/2012)  . Headache     "at least monthly" (08/30/2012)  . Arthritis     "left hand, back" (08/30/2012)  . End stage renal disease on dialysis 02/27/2011    Kidneys shut down at time of liver transplant in Sept 2011 at [facility] in [place], she has been on HD ever since.  Dialyzes at [facility] HD on TTS schedule.  Had L forearm graft used 10 months then removed Dec 2012 due to suspected infection.  A right upper arm AVG was placed Dec 2012 but she developed steal symptoms acutely and it was ligated the same day.  Never had an AV fistula due to small veins.  Now has L thigh AVG put in Jan 2013, has not clotted to date.    [name] CAD (coronary artery disease) Jan. 2015    Cath: 20% LAD,  50% D1; s/p LIMA-LAD  . Aortic stenosis   . Tobacco abuse   . S/P aortic valve replacement 03/15/13    Mechanical     Past Surgical History  Procedure Laterality Date  . Liver transplant  10/25/2009    sees Dr Ferol Luz 1 every 6 months, saw last in Dec 2013. [name] Coord [phone]  . Small intestine surgery  90's  . Thrombectomy    . Arteriovenous graft placement Left 10/03/10     forearm  . Avgg removal  12/23/2010    Procedure: REMOVAL OF ARTERIOVENOUS GORETEX GRAFT ([place]);  Surgeon: Elam Dutch, MD;  Location: [place];  Service: Vascular;  Laterality: Left;  procedure started @1736 -1852  . Insertion of dialysis catheter  12/23/2010    Procedure: INSERTION OF DIALYSIS CATHETER;  Surgeon: Elam Dutch, MD;  Location: [place];  Service: Vascular;  Laterality: Right;  Right Internal Jugular 28cm dialysis catheter insertion procedure  time 1701-1720   . Cholecystectomy  1993  . Cystoscopy  1990's  . Spinal growth rods  2010    "put 2 metal rods in my back; they had detetriorated" (08/30/2012)  . Av fistula placement  01/29/2011    Procedure: INSERTION OF ARTERIOVENOUS (AV) GORE-TEX GRAFT ARM;  Surgeon: Elam Dutch, MD;  Location: [place];  Service: Vascular;  Laterality: Right;  . Av fistula placement  03/10/2011    Procedure: INSERTION OF ARTERIOVENOUS (AV) GORE-TEX GRAFT THIGH;  Surgeon: Elam Dutch, MD;  Location: [place];  Service: Vascular;  Laterality: Left;  . Tubal ligation  1990's  . Cardiac catheterization  2014  . Aortic valve replacement N/A 03/15/2013    AVR; Surgeon: Ivin Poot, MD;  Location: [facility] OR; Open Heart Surgery;  78m[employer] mechanical prosthesis, top hat valve  . Coronary artery bypass graft N/A 03/15/2013    Procedure: CORONARY ARTERY BYPASS GRAFTING (CABG) times one using left internal mammary artery.;  Surgeon: PIvin Poot MD;  [place]  Service: Open Heart Surgery;  Laterality: N/A;  POSS CABG X 1  . Intraoperative  transesophageal echocardiogram N/A 03/15/2013    Procedure: INTRAOPERATIVE TRANSESOPHAGEAL ECHOCARDIOGRAM;  Surgeon: PIvin Poot MD;  [place]  Service: Open Heart Surgery;  Laterality: N/A;    Family History  Problem Relation Age of Onset  . Cancer Mother   . Diabetes Mother   . Hypertension Mother   . Stroke Mother   . Cancer Father   . Anesthesia problems Neg Hx   . Hypotension Neg Hx   . Malignant hyperthermia Neg Hx   . Pseudochol deficiency Neg Hx    Social History:  reports that she quit smoking about 9 months ago. Her smoking use included Cigarettes. She has a 30 pack-year smoking history. She has never used smokeless tobacco. She reports that she drinks alcohol. She reports that she does not use illicit drugs.  Allergies:  Allergies  Allergen Reactions  . Acetaminophen Other (See Comments)    Liver transplant recipient   . Codeine Itching  . Mirtazapine Other (See Comments)    hallucination  . Morphine Itching    Prior to Admission medications   Medication Sig Start Date End Date Taking? Authorizing Provider  albuterol (PROVENTIL,VENTOLIN) 90 MCG/ACT inhaler Inhale 1-2 puffs into the lungs every 6 (six) hours as needed for wheezing or shortness of breath.     Historical Provider, MD  amLODipine (NORVASC) 10 MG tablet Take 10 mg by mouth daily.    Historical Provider, MD  aspirin EC 81 MG tablet Take 81 mg by mouth daily.      Historical Provider, MD  calcium carbonate (TUMS - DOSED IN MG ELEMENTAL CALCIUM) 500 MG chewable tablet Chew 1 tablet by mouth daily.    Historical Provider, MD  cinacalcet (SENSIPAR) 30 MG tablet Take 30 mg by mouth daily.    Historical Provider, MD  DULoxetine (CYMBALTA) 60 MG capsule Take 60 mg by mouth at bedtime.     Historical Provider, MD  gabapentin (NEURONTIN) 300 MG capsule Take 300 mg by mouth at bedtime.     Historical Provider, MD  hydrALAZINE (APRESOLINE) 25 MG tablet Take 25 mg by mouth 3 (three) times daily.     Historical Provider, MD  insulin detemir (LEVEMIR) 100 UNIT/ML injection Inject 5 Units into the skin at bedtime.    Historical Provider, MD  levothyroxine (SYNTHROID, LEVOTHROID) 150 MCG tablet Take 150 mcg by mouth daily before  breakfast.    Historical Provider, MD  lidocaine (LIDODERM) 5 % Place 1 patch onto the skin daily. Remove & Discard patch within 12 hours or as directed by MD    Historical Provider, MD  LORazepam (ATIVAN) 0.5 MG tablet Take 0.5 mg by mouth at bedtime as needed for sleep.  02/08/13   Historical Provider, MD  metoprolol (LOPRESSOR) 50 MG tablet Take 50 mg by mouth 2 (two) times daily.    Historical Provider, MD  Multiple Vitamin (MULTIVITAMIN WITH MINERALS) TABS tablet Take 1 tablet by mouth at bedtime.     Historical Provider, MD  promethazine (PHENERGAN) 12.5 MG tablet Take 12.5 mg by mouth every 4 (four) hours as needed for nausea.  02/09/13   Historical Provider, MD  promethazine (PHENERGAN) 25 MG tablet Take 1 tablet (25 mg total) by mouth every 6 (six) hours as needed for nausea or vomiting. 11/03/13   Jarrett Soho Muthersbaugh, PA-C  tacrolimus (PROGRAF) 1 MG capsule Take 5 mg by mouth 2 (two) times daily.     Historical Provider, MD  traMADol (ULTRAM) 50 MG tablet Take 50 mg by mouth every 12 (twelve) hours as needed for moderate pain.  02/09/13   Historical Provider, MD  VELPHORO 500 MG chewable tablet Chew 1 tablet by mouth 3 (three) times daily with meals.  06/10/13   Historical Provider, MD  warfarin (COUMADIN) 2 MG tablet Take 1.5 to 2 tablets by mouth daily as directed by coumadin clinic 12/14/13   [name], MD     Results for orders placed or performed during the hospital encounter of 12/15/13 (from the past 48 hour(s))  Comprehensive metabolic panel     Status: Abnormal   Collection Time: 12/15/13  1:42 PM  Result Value Ref Range   Sodium 140 137 - 147 mEq/L   Potassium 5.3 3.7 - 5.3 mEq/L    Comment: HEMOLYSIS AT THIS LEVEL MAY AFFECT RESULT   Chloride  94 (L) 96 - 112 mEq/L   CO2 28 19 - 32 mEq/L   Glucose, Bld 236 (H) 70 - 99 mg/dL   BUN 16 6 - 23 mg/dL   Creatinine, Ser 4.25 (H) 0.50 - 1.10 mg/dL   Calcium 8.2 (L) 8.4 - 10.5 mg/dL   Total Protein 7.9 6.0 - 8.3 g/dL   Albumin 3.1 (L) 3.5 - 5.2 g/dL   AST 76 (H) 0 - 37 U/L    Comment: HEMOLYSIS AT THIS LEVEL MAY AFFECT RESULT   ALT 39 (H) 0 - 35 U/L    Comment: HEMOLYSIS AT THIS LEVEL MAY AFFECT RESULT   Alkaline Phosphatase 95 39 - 117 U/L    Comment: HEMOLYSIS AT THIS LEVEL MAY AFFECT RESULT   Total Bilirubin 0.4 0.3 - 1.2 mg/dL   GFR calc non Af Amer 11 (L) >90 mL/min   GFR calc Af Amer 12 (L) >90 mL/min    Comment: (NOTE) The eGFR has been calculated using the CKD EPI equation. This calculation has not been validated in all clinical situations. eGFR's persistently <90 mL/min signify possible Chronic Kidney Disease.    Anion gap 18 (H) 5 - 15  CBC     Status: Abnormal   Collection Time: 12/15/13  1:42 PM  Result Value Ref Range   WBC 5.3 4.0 - 10.5 K/uL   RBC 3.80 (L) 3.87 - 5.11 MIL/uL   Hemoglobin 12.6 12.0 - 15.0 g/dL   HCT 38.6 36.0 - 46.0 %   MCV 101.6 (H) 78.0 - 100.0 fL  MCH 33.2 26.0 - 34.0 pg   MCHC 32.6 30.0 - 36.0 g/dL   RDW 16.8 (H) 11.5 - 15.5 %   Platelets 140 (L) 150 - 400 K/uL  Protime-INR     Status: Abnormal   Collection Time: 12/15/13  1:42 PM  Result Value Ref Range   Prothrombin Time 19.7 (H) 11.6 - 15.2 seconds   INR 1.65 (H) 0.00 - 1.49  CBG monitoring, ED     Status: Abnormal   Collection Time: 12/15/13  1:54 PM  Result Value Ref Range   Glucose-Capillary 237 (H) 70 - 99 mg/dL  I-stat troponin, ED (not at [facility])     Status: None   Collection Time: 12/15/13  1:57 PM  Result Value Ref Range   Troponin i, poc 0.00 0.00 - 0.08 ng/mL   Comment 3            Comment: Due to the release kinetics of cTnI, a negative result within the first hours of the onset of symptoms does not rule out myocardial infarction with certainty. If myocardial  infarction is still suspected, repeat the test at appropriate intervals.    Dg Chest Portable 1 View  12/15/2013   EXAM: PORTABLE CHEST - 1 VIEW  COMPARISON:  None.  FINDINGS: The heart size and mediastinal contours are within normal limits. Both lungs are clear. The visualized skeletal structures are unremarkable.  IMPRESSION: No active disease.   Electronically Signed   By: Franchot Gallo M.D.   On: 12/15/2013 14:27    Review of Systems  Respiratory: Positive for shortness of breath.   Cardiovascular: Positive for chest pain and palpitations. Negative for orthopnea, leg swelling and PND.  Gastrointestinal: Positive for nausea. Negative for vomiting.  Neurological: Negative for dizziness and loss of consciousness.  All other systems reviewed and are negative.   Blood pressure 125/57, pulse 74, temperature 98.2 F (36.8 C), temperature source Oral, resp. rate 16, height 5' 4"  (1.626 m), weight 160 lb (72.576 kg), SpO2 98 %. Physical Exam  Constitutional: She is oriented to person, place, and time. She appears well-developed and well-nourished. No distress.  Neck: No JVD present. Carotid bruit is not present.  Cardiovascular: Normal rate and regular rhythm.  Exam reveals no friction rub.   Murmur (Chris mechanical heart valve sounds present) heard. Pulses:      Radial pulses are 2+ on the right side, and 2+ on the left side.       Dorsalis pedis pulses are 2+ on the right side, and 2+ on the left side.  Respiratory: Effort normal and breath sounds normal. No respiratory distress. She has no wheezes. She has no rales.  GI: Soft. Bowel sounds are normal. She exhibits no distension and no mass. There is tenderness (mild mid epigastric area).  Musculoskeletal: Normal range of motion. She exhibits no edema.  Neurological: She is alert and oriented to person, place, and time.  Skin: Skin is warm and dry. She is not diaphoretic.  Psychiatric: She has a normal mood and affect. Her behavior is  normal.     Assessment/Plan Active Problems:   CAD- s/p LIMA-LAD Feb. 2015   Aortic stenosis, moderate-severe at cath Jan 2015. s/p Mechanical AVR 03/2013   Tobacco abuse   Chronic anticoagulation w/ coumadin, goal INR 2.5-3.0 w/ AVR   DM type 2 (diabetes mellitus, type 2)    1. Chest Pain: Known history of CAD status post single-vessel bypass with LIMA to the LAD in February 2015. Also with  type 2 diabetes and ongoing tobacco abuse. Symptoms of substernal to left sided chest pressure/tightness occurring at rest and relieved with nitroglycerin is concerning for unstable angina. Her EKG demonstrates new T-wave abnormalities in the inferior leads not seen on prior EKG in May 2015. Initial point-of-care troponin is negative. Given history, risk factors and symptomatology, recommend admitting for observation. Continue to cycle cardiac enzymes. If negative, consider nuclear stress testing in a.m. Will place on IV heparin, in the setting of subtherapeutic INR with mechanical aortic valve. Continue IV nitroglycerin as long as blood pressure can tolerate. Continue Metoprolol. No statin given history of liver transplant and mildly elevated LFTs.  2. Status post mechanical aortic valve replacement: INR subtherapeutic at 1.65. INR goal for  mechanical aortic valve is 2.5-3.5. Will place on IV heparin per pharmacy. Will also ask pharmacy to dose Coumadin.   3. Tobacco abuse: smoking cessation strongly advised.  4. Diabetes: glucose on arrival was elevated at 236, however patient reports this was shortly after eating lunch. Monitor closely and continue home insulin.  5. ? Sleep Apnea: family notes frequent snoring and reports observed apneic spells. Recommend out patient sleep study and f/u with Dr. [name].    SIMMONS, BRITTAINY 12/15/2013, 5:03 PM

## 2013-12-15 NOTE — ED Provider Notes (Signed)
CSN: [id number]     Arrival date & time 12/15/13  1324 History   First MD Initiated Contact with Patient 12/15/13 1400     Chief Complaint  Patient presents with  . Chest Pain     (Consider location/radiation/quality/duration/timing/severity/associated sxs/prior Treatment) HPI Patient developed nonradiatinganterior chest pain today while at dialysis at 9 AM today. Pain waxes and wanes. Nothing that patient does makes discomfort better or worse.Improved since treatment with 2 sublingual nitroglycerin by EMS. Patient has also taken aspirin 324 mg prior to coming here today. Pain is mild at present. She describes it as 8 on a scale of 1-10 but states mild. Past Medical History  Diagnosis Date  . 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 Chronic back pain   . CAD (coronary artery disease)   . Obesity   . Peripheral vascular disease hands and legs  . Anxiety   . Asthma   . GERD (gastroesophageal reflux disease)     takes Omeprazole daily  . Chronic constipation     takes MIralax and Colace daily  . Anemia     takes Folic Acid daily  . Hypothyroidism     takes Synthroid daily  . Depression     takes Cymbalta for "severe" depression  . Neuromuscular disorder     carpal tunnel in right hand  . Hypertension     takes Metoprolol and Lisinopril daily, sees Dr Bea Graff  . COPD (chronic obstructive pulmonary disease)   . Pneumonia     "today and several times before" (08/30/2012)  . Chronic bronchitis     "q yr w/season changes" (08/30/2012)  . Type II diabetes mellitus     Levemir 2units daily if > 150  . History of blood transfusion     "several" (08/30/2012)  . Hepatitis C   . Migraine     "last migraine was in 2013" (08/30/2012)  . Headache     "at least monthly" (08/30/2012)  . Arthritis     "left hand, back" (08/30/2012)  . End stage renal disease on dialysis 02/27/2011    Kidneys shut down at time of liver  transplant in Sept 2011 at [facility] in [place], she has been on HD ever since.  Dialyzes at [facility] HD on TTS schedule.  Had L forearm graft used 10 months then removed Dec 2012 due to suspected infection.  A right upper arm AVG was placed Dec 2012 but she developed steal symptoms acutely and it was ligated the same day.  Never had an AV fistula due to small veins.  Now has L thigh AVG put in Jan 2013, has not clotted to date.    [name] CAD (coronary artery disease) Jan. 2015    Cath: 20% LAD, 50% D1  . Aortic stenosis   . Tobacco abuse    Past Surgical History  Procedure Laterality Date  . Liver transplant  10/25/2009    sees Dr Ferol Luz 1 every 6 months, saw last in Dec 2013. [name] Coord [phone]  . Small intestine surgery  90's  . Thrombectomy    . Arteriovenous graft placement Left 10/03/10     forearm  . Avgg removal  12/23/2010    Procedure: REMOVAL OF ARTERIOVENOUS GORETEX GRAFT ([place]);  Surgeon: Elam Dutch, MD;  Location: [place];  Service: Vascular;  Laterality: Left;  procedure started @1736 -1852  . Insertion of  dialysis catheter  12/23/2010    Procedure: INSERTION OF DIALYSIS CATHETER;  Surgeon: Elam Dutch, MD;  Location: [place];  Service: Vascular;  Laterality: Right;  Right Internal Jugular 28cm dialysis catheter insertion procedure time 1701-1720   . Cholecystectomy  1993  . Cystoscopy  1990's  . Spinal growth rods  2010    "put 2 metal rods in my back; they had detetriorated" (08/30/2012)  . Av fistula placement  01/29/2011    Procedure: INSERTION OF ARTERIOVENOUS (AV) GORE-TEX GRAFT ARM;  Surgeon: Elam Dutch, MD;  Location: [place];  Service: Vascular;  Laterality: Right;  . Av fistula placement  03/10/2011    Procedure: INSERTION OF ARTERIOVENOUS (AV) GORE-TEX GRAFT THIGH;  Surgeon: Elam Dutch, MD;  Location: [place];  Service: Vascular;  Laterality: Left;  . Tubal ligation  1990's  . Cardiac catheterization  2014  . Aortic valve replacement N/A  03/15/2013    AVR; Surgeon: Ivin Poot, MD;  Location: [facility] OR; Open Heart Surgery;  24mm[employer] mechanical prosthesis, top hat valve  . Coronary artery bypass graft N/A 03/15/2013    Procedure: CORONARY ARTERY BYPASS GRAFTING (CABG) times one using left internal mammary artery.;  Surgeon: Ivin Poot, MD;  Location: [place];  Service: Open Heart Surgery;  Laterality: N/A;  POSS CABG X 1  . Intraoperative transesophageal echocardiogram N/A 03/15/2013    Procedure: INTRAOPERATIVE TRANSESOPHAGEAL ECHOCARDIOGRAM;  Surgeon: Ivin Poot, MD;  Location: [place];  Service: Open Heart Surgery;  Laterality: N/A;   Family History  Problem Relation Age of Onset  . Cancer Mother   . Diabetes Mother   . Hypertension Mother   . Stroke Mother   . Cancer Father   . Anesthesia problems Neg Hx   . Hypotension Neg Hx   . Malignant hyperthermia Neg Hx   . Pseudochol deficiency Neg Hx    History  Substance Use Topics  . Smoking status: Former Smoker -- 0.75 packs/day for 40 years    Types: Cigarettes    Quit date: 03/13/2013  . Smokeless tobacco: Never Used     Comment: still smokng 1 or 2 a day  . Alcohol Use: Yes     Comment: 08/30/2012 "last drink was at a wedding July, 2014; had a small glass of wine; never had problems w/alcohol"  current smoker OB History    No data available     Review of Systems  Constitutional: Negative.   HENT: Negative.   Respiratory: Positive for shortness of breath.   Cardiovascular: Positive for chest pain.  Gastrointestinal: Negative.   [facility] Tues[facility] Thursdays Saturdays  Musculoskeletal: Negative.   Skin: Negative.   Neurological: Negative.   Psychiatric/Behavioral: Negative.   All other systems reviewed and are negative.     Allergies  Acetaminophen; Codeine; and Morphine  Home Medications   Prior to Admission medications   Medication Sig Start Date End Date Taking? Authorizing Provider  albuterol  (PROVENTIL,VENTOLIN) 90 MCG/ACT inhaler Inhale 1-2 puffs into the lungs every 6 (six) hours as needed for wheezing or shortness of breath.     Historical Provider, MD  amLODipine (NORVASC) 10 MG tablet Take 10 mg by mouth daily.    Historical Provider, MD  aspirin EC 81 MG tablet Take 81 mg by mouth daily.      Historical Provider, MD  calcium carbonate (TUMS - DOSED IN MG ELEMENTAL CALCIUM) 500 MG chewable tablet Chew 1 tablet by mouth  daily.    Historical Provider, MD  cinacalcet (SENSIPAR) 30 MG tablet Take 30 mg by mouth daily.    Historical Provider, MD  DULoxetine (CYMBALTA) 60 MG capsule Take 60 mg by mouth at bedtime.     Historical Provider, MD  gabapentin (NEURONTIN) 300 MG capsule Take 300 mg by mouth at bedtime.     Historical Provider, MD  hydrALAZINE (APRESOLINE) 25 MG tablet Take 25 mg by mouth 3 (three) times daily.    Historical Provider, MD  insulin detemir (LEVEMIR) 100 UNIT/ML injection Inject 5 Units into the skin at bedtime.    Historical Provider, MD  levothyroxine (SYNTHROID, LEVOTHROID) 150 MCG tablet Take 150 mcg by mouth daily before breakfast.    Historical Provider, MD  lidocaine (LIDODERM) 5 % Place 1 patch onto the skin daily. Remove & Discard patch within 12 hours or as directed by MD    Historical Provider, MD  LORazepam (ATIVAN) 0.5 MG tablet Take 0.5 mg by mouth at bedtime as needed for sleep.  02/08/13   Historical Provider, MD  metoprolol (LOPRESSOR) 50 MG tablet Take 50 mg by mouth 2 (two) times daily.    Historical Provider, MD  Multiple Vitamin (MULTIVITAMIN WITH MINERALS) TABS tablet Take 1 tablet by mouth at bedtime.     Historical Provider, MD  promethazine (PHENERGAN) 12.5 MG tablet Take 12.5 mg by mouth every 4 (four) hours as needed for nausea.  02/09/13   Historical Provider, MD  promethazine (PHENERGAN) 25 MG tablet Take 1 tablet (25 mg total) by mouth every 6 (six) hours as needed for nausea or vomiting. 11/03/13   Jarrett Soho Muthersbaugh, PA-C  tacrolimus  (PROGRAF) 1 MG capsule Take 5 mg by mouth 2 (two) times daily.     Historical Provider, MD  traMADol (ULTRAM) 50 MG tablet Take 50 mg by mouth every 12 (twelve) hours as needed for moderate pain.  02/09/13   Historical Provider, MD  VELPHORO 500 MG chewable tablet Chew 1 tablet by mouth 3 (three) times daily with meals.  06/10/13   Historical Provider, MD  warfarin (COUMADIN) 2 MG tablet Take 1.5 to 2 tablets by mouth daily as directed by coumadin clinic 12/14/13   [name], MD   BP 127/52 mmHg  Pulse 75  Temp(Src) 98.2 F (36.8 C) (Oral)  Resp 20  Ht 5\' 4"  (1.626 m)  Wt 160 lb (72.576 kg)  BMI 27.45 kg/m2  SpO2 99% Physical Exam  Constitutional: She appears well-developed and well-nourished.  HENT:  Head: Normocephalic and atraumatic.  Eyes: Conjunctivae are normal. Pupils are equal, round, and reactive to light.  Neck: Neck supple. No tracheal deviation present. No thyromegaly present.  Cardiovascular: Normal rate and regular rhythm.   No murmur heard. Pulmonary/Chest: Effort normal and breath sounds normal.  Abdominal: Soft. Bowel sounds are normal. She exhibits no distension. There is no tenderness.  Musculoskeletal: Normal range of motion. She exhibits no edema or tenderness.  Dialysis graft in left thigh with good thrill  Neurological: She is alert. No cranial nerve deficit. Coordination normal.  Skin: Skin is warm and dry. No rash noted.  Psychiatric: She has a normal mood and affect.  Nursing note and vitals reviewed.   ED Course  Procedures (including critical care time) Labs Review Labs Reviewed  CBC - Abnormal; Notable for the following:    RBC 3.80 (*)    MCV 101.6 (*)    RDW 16.8 (*)    Platelets 140 (*)    All other  components within normal limits  CBG MONITORING, ED - Abnormal; Notable for the following:    Glucose-Capillary 237 (*)    All other components within normal limits  COMPREHENSIVE METABOLIC PANEL  PROTIME-INR  I-STAT TROPOININ, ED     Imaging Review No results found.   EKG Interpretation None      Date: 12/15/2013  Rate: 80  Rhythm: normal sinus rhythm  QRS Axis: normal  Intervals: normal  ST/T Wave abnormalities: nonspecific ST/T changes  Conduction Disutrbances:none  Narrative Interpretation:   Old EKG Reviewed: no significant change from 07/03/2013 interpreted by me 4:15 PM patient pain is much improved after treatment with intravenous nitroglycerin drip she now describes it a 2 on a scale of 1-10.   4:25 PM patient asymptomatic pain-free Results for orders placed or performed during the hospital encounter of 12/15/13  Comprehensive metabolic panel  Result Value Ref Range   Sodium 140 137 - 147 mEq/L   Potassium 5.3 3.7 - 5.3 mEq/L   Chloride 94 (L) 96 - 112 mEq/L   CO2 28 19 - 32 mEq/L   Glucose, Bld 236 (H) 70 - 99 mg/dL   BUN 16 6 - 23 mg/dL   Creatinine, Ser 4.25 (H) 0.50 - 1.10 mg/dL   Calcium 8.2 (L) 8.4 - 10.5 mg/dL   Total Protein 7.9 6.0 - 8.3 g/dL   Albumin 3.1 (L) 3.5 - 5.2 g/dL   AST 76 (H) 0 - 37 U/L   ALT 39 (H) 0 - 35 U/L   Alkaline Phosphatase 95 39 - 117 U/L   Total Bilirubin 0.4 0.3 - 1.2 mg/dL   GFR calc non Af Amer 11 (L) >90 mL/min   GFR calc Af Amer 12 (L) >90 mL/min   Anion gap 18 (H) 5 - 15  CBC  Result Value Ref Range   WBC 5.3 4.0 - 10.5 K/uL   RBC 3.80 (L) 3.87 - 5.11 MIL/uL   Hemoglobin 12.6 12.0 - 15.0 g/dL   HCT 38.6 36.0 - 46.0 %   MCV 101.6 (H) 78.0 - 100.0 fL   MCH 33.2 26.0 - 34.0 pg   MCHC 32.6 30.0 - 36.0 g/dL   RDW 16.8 (H) 11.5 - 15.5 %   Platelets 140 (L) 150 - 400 K/uL  Protime-INR  Result Value Ref Range   Prothrombin Time 19.7 (H) 11.6 - 15.2 seconds   INR 1.65 (H) 0.00 - 1.49  I-stat troponin, ED (not at [facility])  Result Value Ref Range   Troponin i, poc 0.00 0.00 - 0.08 ng/mL   Comment 3          CBG monitoring, ED  Result Value Ref Range   Glucose-Capillary 237 (H) 70 - 99 mg/dL   Dg Chest Portable 1 View  12/15/2013   EXAM: PORTABLE  CHEST - 1 VIEW  COMPARISON:  None.  FINDINGS: The heart size and mediastinal contours are within normal limits. Both lungs are clear. The visualized skeletal structures are unremarkable.  IMPRESSION: No active disease.   Electronically Signed   By: Franchot Gallo M.D.   On: 12/15/2013 14:27  chest xray viewed by me  MDM  [place] [facility] called to evaluate patient for admission. She is also subtherapeutic on Coumadin with a mechanical valve. Heparin ordered per pharmacy protocol Diagnosis #1 unstable angina #2 subtherapeutic INR Final diagnoses:  None   #3 hyperglycemia  CRITICAL CARE Performed by: Orlie Dakin Total critical care time: 40 minute Critical care time was exclusive of separately  billable procedures and treating other patients. Critical care was necessary to treat or prevent imminent or life-threatening deterioration. Critical care was time spent personally by me on the following activities: development of treatment plan with patient and/or surrogate as well as nursing, discussions with consultants, evaluation of patient's response to treatment, examination of patient, obtaining history from patient or surrogate, ordering and performing treatments and interventions, ordering and review of laboratory studies, ordering and review of radiographic studies, pulse oximetry and re-evaluation of patient's condition.    Orlie Dakin, MD 12/15/13 [phone]

## 2013-12-15 NOTE — ED Notes (Signed)
Ordered carb mod diet for pt per verbal order from MD

## 2013-12-15 NOTE — H&P (Addendum)
[facility]                                                                                    Patient Demographics  [name], is a 58 y.o. female  CSN: [id number]  MRN: [id number]  DOB - 09-15-1955  Admit Date - 12/15/2013  Outpatient Primary MD for the patient is Charlynn Court, NP   With History of -  Past Medical History  Diagnosis Date  . 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 Chronic back pain   . CAD (coronary artery disease)   . Obesity   . Peripheral vascular disease hands and legs  . Anxiety   . Asthma   . GERD (gastroesophageal reflux disease)     takes Omeprazole daily  . Chronic constipation     takes MIralax and Colace daily  . Anemia     takes Folic Acid daily  . Hypothyroidism     takes Synthroid daily  . Depression     takes Cymbalta for "severe" depression  . Neuromuscular disorder     carpal tunnel in right hand  . Hypertension     takes Metoprolol and Lisinopril daily, sees Dr Bea Graff  . COPD (chronic obstructive pulmonary disease)   . Pneumonia     "today and several times before" (08/30/2012)  . Chronic bronchitis     "q yr w/season changes" (08/30/2012)  . Type II diabetes mellitus     Levemir 2units daily if > 150  . History of blood transfusion     "several" (08/30/2012)  . Hepatitis C   . Migraine     "last migraine was in 2013" (08/30/2012)  . Headache     "at least monthly" (08/30/2012)  . Arthritis     "left hand, back" (08/30/2012)  . End stage renal disease on dialysis 02/27/2011    Kidneys shut down at time of liver transplant in Sept 2011 at [facility] in [place], she has been on HD ever since.  Dialyzes at [facility] HD on TTS schedule.  Had L forearm graft used 10 months then removed Dec 2012 due to suspected infection.  A right upper arm AVG was placed Dec 2012 but she developed steal symptoms acutely and it was ligated the same day.  Never  had an AV fistula due to small veins.  Now has L thigh AVG put in Jan 2013, has not clotted to date.    [name] CAD (coronary artery disease) Jan. 2015    Cath: 20% LAD, 50% D1; s/p LIMA-LAD  . Aortic stenosis   . Tobacco abuse   . S/P aortic valve replacement 03/15/13    Mechanical       Past Surgical History  Procedure Laterality Date  . Liver transplant  10/25/2009    sees Dr Ferol Luz 1 every 6 months, saw last in Dec 2013. [name] Coord [phone]  . Small intestine surgery  90's  . Thrombectomy    . Arteriovenous graft placement Left 10/03/10     forearm  . Avgg removal  12/23/2010    Procedure: REMOVAL OF ARTERIOVENOUS GORETEX GRAFT ([place]);  Surgeon: Elam Dutch, MD;  Location: [place];  Service: Vascular;  Laterality: Left;  procedure started @1736 -1852  . Insertion of dialysis catheter  12/23/2010    Procedure: INSERTION OF DIALYSIS CATHETER;  Surgeon: Elam Dutch, MD;  Location: [place];  Service: Vascular;  Laterality: Right;  Right Internal Jugular 28cm dialysis catheter insertion procedure time 1701-1720   . Cholecystectomy  1993  . Cystoscopy  1990's  . Spinal growth rods  2010    "put 2 metal rods in my back; they had detetriorated" (08/30/2012)  . Av fistula placement  01/29/2011    Procedure: INSERTION OF ARTERIOVENOUS (AV) GORE-TEX GRAFT ARM;  Surgeon: Elam Dutch, MD;  Location: [place];  Service: Vascular;  Laterality: Right;  . Av fistula placement  03/10/2011    Procedure: INSERTION OF ARTERIOVENOUS (AV) GORE-TEX GRAFT THIGH;  Surgeon: Elam Dutch, MD;  Location: [place];  Service: Vascular;  Laterality: Left;  . Tubal ligation  1990's  . Cardiac catheterization  2014  . Aortic valve replacement N/A 03/15/2013    AVR; Surgeon: Ivin Poot, MD;  Location: [facility] OR; Open Heart Surgery;  65mm[employer] mechanical prosthesis, top hat valve  . Coronary artery bypass graft N/A 03/15/2013    Procedure: CORONARY ARTERY BYPASS GRAFTING (CABG) times one using left  internal mammary artery.;  Surgeon: Ivin Poot, MD;  Location: [place];  Service: Open Heart Surgery;  Laterality: N/A;  POSS CABG X 1  . Intraoperative transesophageal echocardiogram N/A 03/15/2013    Procedure: INTRAOPERATIVE TRANSESOPHAGEAL ECHOCARDIOGRAM;  Surgeon: Ivin Poot, MD;  Location: [place];  Service: Open Heart Surgery;  Laterality: N/A;    in for   Chief Complaint  Patient presents with  . Chest Pain     HPI  [name]  is a 58 y.o. female, with past medical history significant for coronary artery disease status post LIMA to LAD, aortic stenosis status post aVR in 2015 ,hepatitis C status post liver transplant in 2011 at [facility], and ESRD renal disease on hemodialysis, presenting with chest pain episodes that had been occuring during hemodialysis .earlier today the patient had an episode of chest pain that lasted around 2 hours during dialysis, dialysis had to be held for short periods of time. It was nonradiating left-sided associated with clamminess but no nausea or vomiting, however the patient reports associated shortness of breath.the patient went home and continued having chest pain episodes similar to the previous one which was relieved by nitroglycerin sublingual Her EKG showed new T-wave inversions in the inferior leads, cardiology was consulted and patient was started on heparin and nitroglycerin drips for cardiac catheterization in a.m.    Review of Systems    In addition to the HPI above,  No Fever-chills, No Headache, No changes with Vision or hearing, No problems swallowing food or Liquids, No Abdominal pain, No Nausea or Vommitting, Bowel movements are regular, No Blood in stool or Urine, No dysuria, No new skin rashes or bruises, No new joints pains-aches,  No new weakness, tingling, numbness in any extremity, No recent weight gain or loss, No polyuria, polydypsia or polyphagia, No significant Mental Stressors.  A full 10 point Review of  Systems was done, except as stated above, all other Review of Systems were negative.   Social History History  Substance Use Topics  . Smoking status: Former Smoker -- 0.75 packs/day for 40 years  Types: Cigarettes    Quit date: 03/13/2013  . Smokeless tobacco: Never Used     Comment: still smokng 1 or 2 a day  . Alcohol Use: Yes     Comment: 08/30/2012 "last drink was at a wedding July, 2014; had a small glass of wine; never had problems w/alcohol"     Family History Family History  Problem Relation Age of Onset  . Cancer Mother   . Diabetes Mother   . Hypertension Mother   . Stroke Mother   . Cancer Father   . Anesthesia problems Neg Hx   . Hypotension Neg Hx   . Malignant hyperthermia Neg Hx   . Pseudochol deficiency Neg Hx      Prior to Admission medications   Medication Sig Start Date End Date Taking? Authorizing Provider  albuterol (PROVENTIL,VENTOLIN) 90 MCG/ACT inhaler Inhale 1-2 puffs into the lungs every 6 (six) hours as needed for wheezing or shortness of breath.    Yes Historical Provider, MD  aspirin EC 81 MG tablet Take 81 mg by mouth daily.     Yes Historical Provider, MD  calcium carbonate (TUMS - DOSED IN MG ELEMENTAL CALCIUM) 500 MG chewable tablet Chew 1 tablet by mouth daily.   Yes Historical Provider, MD  cinacalcet (SENSIPAR) 30 MG tablet Take 30 mg by mouth daily.   Yes Historical Provider, MD  gabapentin (NEURONTIN) 300 MG capsule Take 300 mg by mouth at bedtime.    Yes Historical Provider, MD  insulin detemir (LEVEMIR) 100 UNIT/ML injection Inject 2-5 Units into the skin at bedtime. 150-200 do 2 units >200 do 5 units   Yes Historical Provider, MD  levothyroxine (SYNTHROID, LEVOTHROID) 150 MCG tablet Take 150 mcg by mouth daily before breakfast.   Yes Historical Provider, MD  LORazepam (ATIVAN) 0.5 MG tablet Take 0.5 mg by mouth at bedtime as needed for sleep.  02/08/13  Yes Historical Provider, MD  metoprolol (LOPRESSOR) 50 MG tablet Take 50 mg by  mouth 2 (two) times daily.   Yes Historical Provider, MD  Multiple Vitamin (MULTIVITAMIN WITH MINERALS) TABS tablet Take 1 tablet by mouth at bedtime.    Yes Historical Provider, MD  ondansetron (ZOFRAN-ODT) 4 MG disintegrating tablet Take 4 mg by mouth every 8 (eight) hours as needed for nausea or vomiting.   Yes Historical Provider, MD  polyethylene glycol (MIRALAX / GLYCOLAX) packet Take 17 g by mouth as needed.   Yes Historical Provider, MD  promethazine (PHENERGAN) 12.5 MG tablet Take 12.5 mg by mouth every 4 (four) hours as needed for nausea.  02/09/13  Yes Historical Provider, MD  tacrolimus (PROGRAF) 1 MG capsule Take 3 mg by mouth 2 (two) times daily.    Yes Historical Provider, MD  warfarin (COUMADIN) 2 MG tablet Take 1.5 to 2 tablets by mouth daily as directed by coumadin clinic Patient taking differently: Take 3-4 mg by mouth daily. Take mg Tuesday through Sunday and on Monday take 4mg  12/14/13  Yes [name], MD  amLODipine (NORVASC) 10 MG tablet Take 10 mg by mouth daily.    Historical Provider, MD  DULoxetine (CYMBALTA) 60 MG capsule Take 60 mg by mouth at bedtime.     Historical Provider, MD  hydrALAZINE (APRESOLINE) 25 MG tablet Take 25 mg by mouth 3 (three) times daily.    Historical Provider, MD  lidocaine (LIDODERM) 5 % Place 1 patch onto the skin daily. Remove & Discard patch within 12 hours or as directed by MD  Historical Provider, MD  promethazine (PHENERGAN) 25 MG tablet Take 1 tablet (25 mg total) by mouth every 6 (six) hours as needed for nausea or vomiting. 11/03/13   Jarrett Soho Muthersbaugh, PA-C  traMADol (ULTRAM) 50 MG tablet Take 50 mg by mouth every 12 (twelve) hours as needed for moderate pain.  02/09/13   Historical Provider, MD  VELPHORO 500 MG chewable tablet Chew 1 tablet by mouth 3 (three) times daily with meals.  06/10/13   Historical Provider, MD    Allergies  Allergen Reactions  . Acetaminophen Other (See Comments)    Liver transplant recipient   .  Codeine Itching  . Mirtazapine Other (See Comments)    hallucination  . Morphine Itching    Physical Exam  Vitals  Blood pressure 116/51, pulse 75, temperature 98.2 F (36.8 C), temperature source Oral, resp. rate 20, height 5\' 4"  (1.626 m), weight 72.576 kg (160 lb), SpO2 96 %.   1. General elderly female, very pleasant, chest pain-free at this time. Looks  younger than age  69. Normal affect and insight, Not Suicidal or Homicidal, Awake Alert, Oriented X 3.  3. No F.N deficit sgrossly,.  4. Ears and Eyes appear Normal, Conjunctivae clear, PERRLA. Moist Oral Mucosa.  5. Supple Neck, No JVD, No cervical lymphadenopathy appriciated, No Carotid Bruits.  6. Symmetrical Chest wall movement, Good air movement bilaterally, CTAB.  7. RRR, 3/6 systolic murmur with systolic click, No Parasternal Heave.  8. Positive Bowel Sounds, Abdomen Soft, Non tender, No organomegaly appriciated,No rebound -guarding or rigidity.  9.  No Cyanosis, Normal Skin Turgor, No Skin Rash or Bruise.  10. Good muscle tone,  joints appear normal , no effusions, Normal ROM.  11. No Palpable Lymph Nodes in Neck or Axillae    Data Review  CBC  Recent Labs Lab 12/15/13 1342  WBC 5.3  HGB 12.6  HCT 38.6  PLT 140*  MCV 101.6*  MCH 33.2  MCHC 32.6  RDW 16.8*   ------------------------------------------------------------------------------------------------------------------  Chemistries   Recent Labs Lab 12/15/13 1342  NA 140  K 5.3  CL 94*  CO2 28  GLUCOSE 236*  BUN 16  CREATININE 4.25*  CALCIUM 8.2*  AST 76*  ALT 39*  ALKPHOS 95  BILITOT 0.4   ------------------------------------------------------------------------------------------------------------------ estimated creatinine clearance is 13.9 mL/min (by C-G formula based on Cr of 4.25). ------------------------------------------------------------------------------------------------------------------ No results for input(s): TSH,  T4TOTAL, T3FREE, THYROIDAB in the last 72 hours.  Invalid input(s): FREET3   Coagulation profile  Recent Labs Lab 12/15/13 1342  INR 1.65*   ------------------------------------------------------------------------------------------------------------------- No results for input(s): DDIMER in the last 72 hours. -------------------------------------------------------------------------------------------------------------------  Cardiac Enzymes No results for input(s): CKMB, TROPONINI, MYOGLOBIN in the last 168 hours.  Invalid input(s): CK ------------------------------------------------------------------------------------------------------------------ Invalid input(s): POCBNP   ---------------------------------------------------------------------------------------------------------------  Urinalysis    Component Value Date/Time   COLORURINE YELLOW 11/02/2012 0428   APPEARANCEUR CLOUDY* 11/02/2012 0428   LABSPEC 1.011 11/02/2012 0428   PHURINE 8.0 11/02/2012 0428   GLUCOSEU 250* 11/02/2012 0428   HGBUR LARGE* 11/02/2012 0428   BILIRUBINUR NEGATIVE 11/02/2012 0428   KETONESUR NEGATIVE 11/02/2012 0428   PROTEINUR >300* 11/02/2012 0428   UROBILINOGEN 0.2 11/02/2012 0428   NITRITE NEGATIVE 11/02/2012 0428   LEUKOCYTESUR NEGATIVE 11/02/2012 0428    ----------------------------------------------------------------------------------------------------------------    Imaging results:   Dg Chest Portable 1 View  12/15/2013   EXAM: PORTABLE CHEST - 1 VIEW  COMPARISON:  None.  FINDINGS: The heart size and mediastinal contours are within normal limits. Both lungs are  clear. The visualized skeletal structures are unremarkable.  IMPRESSION: No active disease.   Electronically Signed   By: Franchot Gallo M.D.   On: 12/15/2013 14:27    My personal review of EKG: Rhythm NSR, at 79 B/M , inferolateral t wave inversions    Assessment & Plan  1. Unstable Angina with new T Wave  inversions in inferolateral leads 2. CAD S/P single vessel LIMA to LAD on 03/15/13 3. AS S/P AVR on 03/15/13 EF on 03/15/13 was 55% 4. ESRD on HDTuesday, Thursday and Saturday 5.DM type 2 on insulin sliding scale 6. S/P liver Transplant on 2011 for Hep C 7. Tobacco Use  Plan  Cardiology consult note reviewed and appreciated Continue with nitroglycerin and heparin IV and hold heparin 1 hour prior to procedure Serial troponins Left cardiac catheterization in AM Continue beta blockers CT angiogram in a.m. If cardiac catheterization is negative No statins due to liver transplant  DVT Prophylaxis heparin  AM Labs Ordered, also please review Full Orders  Code Status full  Disposition Plan: home  Time spent in minutes : 42 minutes  Condition GUARDED   @SIGNATURE @

## 2013-12-16 ENCOUNTER — Encounter (HOSPITAL_COMMUNITY)
Admission: EM | Disposition: A | Discharge status: Home / Self Care | Payer: Self-pay | Source: Home / Self Care | Attending: Internal Medicine

## 2013-12-16 DIAGNOSIS — I25110 Atherosclerotic heart disease of native coronary artery with unstable angina pectoris: Principal | ICD-10-CM

## 2013-12-16 HISTORY — PX: LEFT HEART CATHETERIZATION WITH CORONARY/GRAFT ANGIOGRAM: SHX5450

## 2013-12-16 LAB — CBC
HCT: 36.6 % (ref 36.0–46.0)
HEMOGLOBIN: 11.4 g/dL — AB (ref 12.0–15.0)
MCH: 32.5 pg (ref 26.0–34.0)
MCHC: 31.1 g/dL (ref 30.0–36.0)
MCV: 104.3 fL — AB (ref 78.0–100.0)
Platelets: 138 10*3/uL — ABNORMAL LOW (ref 150–400)
RBC: 3.51 MIL/uL — ABNORMAL LOW (ref 3.87–5.11)
RDW: 17 % — AB (ref 11.5–15.5)
WBC: 5.1 10*3/uL (ref 4.0–10.5)

## 2013-12-16 LAB — GLUCOSE, CAPILLARY
GLUCOSE-CAPILLARY: 102 mg/dL — AB (ref 70–99)
Glucose-Capillary: 221 mg/dL — ABNORMAL HIGH (ref 70–99)
Glucose-Capillary: 124 mg/dL — ABNORMAL HIGH (ref 70–99)
Glucose-Capillary: 107 mg/dL — ABNORMAL HIGH (ref 70–99)
Glucose-Capillary: 189 mg/dL — ABNORMAL HIGH (ref 70–99)

## 2013-12-16 LAB — TROPONIN I: Troponin I: <0.3 ng/mL

## 2013-12-16 LAB — PROTIME-INR
INR: 1.88 — ABNORMAL HIGH (ref 0.00–1.49)
INR: 1.76 — AB (ref 0.00–1.49)
PROTHROMBIN TIME: 20.7 s — AB (ref 11.6–15.2)
Prothrombin Time: 21.8 s — ABNORMAL HIGH (ref 11.6–15.2)

## 2013-12-16 LAB — BASIC METABOLIC PANEL
Anion gap: 17 — ABNORMAL HIGH (ref 5–15)
BUN: 29 mg/dL — AB (ref 6–23)
CHLORIDE: 97 meq/L (ref 96–112)
CO2: 28 mEq/L (ref 19–32)
CREATININE: 6.19 mg/dL — AB (ref 0.50–1.10)
Calcium: 7.9 mg/dL — ABNORMAL LOW (ref 8.4–10.5)
GFR calc Af Amer: 8 mL/min — ABNORMAL LOW (ref >90)
GFR calc non Af Amer: 7 mL/min — ABNORMAL LOW (ref >90)
Glucose, Bld: 173 mg/dL — ABNORMAL HIGH (ref 70–99)
Potassium: 4.8 mEq/L (ref 3.7–5.3)
Sodium: 142 mEq/L (ref 137–147)

## 2013-12-16 LAB — HEPARIN LEVEL (UNFRACTIONATED)
Heparin Unfractionated: 0.36 IU/mL (ref 0.30–0.70)
Heparin Unfractionated: 0.33 IU/mL (ref 0.30–0.70)

## 2013-12-16 LAB — POCT ACTIVATED CLOTTING TIME: ACTIVATED CLOTTING TIME: 169 s

## 2013-12-16 LAB — HEMOGLOBIN A1C
HEMOGLOBIN A1C: 6.3 % — AB (ref <5.7)
MEAN PLASMA GLUCOSE: 134 mg/dL — AB (ref <117)

## 2013-12-16 LAB — D-DIMER, QUANTITATIVE: D-Dimer, Quant: 0.4 ug/mL-FEU (ref 0.00–0.48)

## 2013-12-16 SURGERY — LEFT HEART CATHETERIZATION WITH CORONARY/GRAFT ANGIOGRAM
Anesthesia: LOCAL

## 2013-12-16 MED ORDER — SODIUM CHLORIDE 0.9 % IV SOLN: 100.0000 mL | INTRAVENOUS | Status: DC | PRN

## 2013-12-16 MED ORDER — WARFARIN SODIUM 4 MG PO TABS
4.0000 mg | ORAL_TABLET | Freq: Once | ORAL | Status: DC
  Filled 2013-12-16: qty 1

## 2013-12-16 MED ORDER — HEPARIN (PORCINE) IN NACL 100-0.45 UNIT/ML-% IJ SOLN
1000.0000 [IU]/h | INTRAMUSCULAR | Status: DC
  Administered 2013-12-17: 850 [IU]/h via INTRAVENOUS
  Administered 2013-12-18 – 2013-12-20 (×3): 1000 [IU]/h via INTRAVENOUS
  Filled 2013-12-16 (×6): qty 250

## 2013-12-16 MED ORDER — POLYETHYLENE GLYCOL 3350 17 G PO PACK
17.0000 g | PACK | Freq: Every day | ORAL | Status: DC
  Administered 2013-12-17 – 2013-12-21 (×6): 17 g via ORAL
  Filled 2013-12-16 (×8): qty 1

## 2013-12-16 MED ORDER — HEPARIN (PORCINE) IN NACL 2-0.9 UNIT/ML-% IJ SOLN
INTRAMUSCULAR | Status: AC
  Filled 2013-12-16: qty 1000

## 2013-12-16 MED ORDER — ACETAMINOPHEN 325 MG PO TABS: 650.0000 mg | ORAL_TABLET | ORAL | Status: DC | PRN

## 2013-12-16 MED ORDER — HEPARIN (PORCINE) IN NACL 100-0.45 UNIT/ML-% IJ SOLN
850.0000 [IU]/h | INTRAMUSCULAR | Status: DC
  Filled 2013-12-16: qty 250

## 2013-12-16 MED ORDER — MIDAZOLAM HCL 2 MG/2ML IJ SOLN
INTRAMUSCULAR | Status: AC
  Filled 2013-12-16: qty 2

## 2013-12-16 MED ORDER — ONDANSETRON HCL 4 MG/2ML IJ SOLN: 4.0000 mg | Freq: Four times a day (QID) | INTRAMUSCULAR | Status: DC | PRN

## 2013-12-16 MED ORDER — LIDOCAINE HCL (PF) 1 % IJ SOLN
INTRAMUSCULAR | Status: AC
  Filled 2013-12-16: qty 30

## 2013-12-16 MED ORDER — LIDOCAINE-PRILOCAINE 2.5-2.5 % EX CREA
1.0000 "application " | TOPICAL_CREAM | CUTANEOUS | Status: DC | PRN
  Filled 2013-12-16: qty 5

## 2013-12-16 MED ORDER — ACETAMINOPHEN 325 MG PO TABS: 650.0000 mg | ORAL_TABLET | Freq: Four times a day (QID) | ORAL | Status: DC | PRN

## 2013-12-16 MED ORDER — ACETAMINOPHEN 650 MG RE SUPP: 650.0000 mg | Freq: Four times a day (QID) | RECTAL | Status: DC | PRN

## 2013-12-16 MED ORDER — PROMETHAZINE HCL 25 MG/ML IJ SOLN
12.5000 mg | Freq: Four times a day (QID) | INTRAMUSCULAR | Status: DC | PRN
  Administered 2013-12-16: 12.5 mg via INTRAVENOUS
  Filled 2013-12-16 (×2): qty 1

## 2013-12-16 MED ORDER — WARFARIN - PHARMACIST DOSING INPATIENT: Freq: Every day | Status: DC

## 2013-12-16 MED ORDER — SODIUM CHLORIDE 0.9 % IV SOLN
INTRAVENOUS | Status: DC
  Administered 2013-12-16: 18:00:00 via INTRAVENOUS

## 2013-12-16 MED ORDER — FENTANYL CITRATE 0.05 MG/ML IJ SOLN
INTRAMUSCULAR | Status: AC
  Filled 2013-12-16: qty 2

## 2013-12-16 MED ORDER — PENTAFLUOROPROP-TETRAFLUOROETH EX AERO: 1.0000 "application " | INHALATION_SPRAY | CUTANEOUS | Status: DC | PRN

## 2013-12-16 MED ORDER — NITROGLYCERIN 1 MG/10 ML FOR IR/CATH LAB
INTRA_ARTERIAL | Status: AC
  Filled 2013-12-16: qty 10

## 2013-12-16 MED ORDER — CALCITRIOL 0.5 MCG PO CAPS
0.5000 ug | ORAL_CAPSULE | ORAL | Status: DC
  Administered 2013-12-17 – 2013-12-20 (×2): 0.5 ug via ORAL
  Filled 2013-12-16 (×3): qty 1

## 2013-12-16 MED ORDER — NEPRO/CARBSTEADY PO LIQD
237.0000 mL | ORAL | Status: DC | PRN
  Filled 2013-12-16: qty 237

## 2013-12-16 MED ORDER — LIDOCAINE HCL (PF) 1 % IJ SOLN: 5.0000 mL | INTRAMUSCULAR | Status: DC | PRN

## 2013-12-16 NOTE — Progress Notes (Signed)
ANTICOAGULATION CONSULT NOTE - Follow Up Consult  Pharmacy Consult for heparin Indication: chest pain/ACS and AVR  Labs:  Recent Labs  12/15/13 1342 12/15/13 2120 12/16/13 0035  HGB 12.6  --   --   HCT 38.6  --   --   PLT 140*  --   --   LABPROT 19.7*  --   --   INR 1.65*  --   --   HEPARINUNFRC  --   --  0.36  CREATININE 4.25*  --   --   TROPONINI  --  <0.30  --     Assessment/Plan:  58yo female therapeutic on heparin with initial dosing while Coumadin on hold for possible cath. Will continue gtt at current rate and confirm stable with additional level.   Wynona Neat, PharmD, BCPS  12/16/2013,1:33 AM

## 2013-12-16 NOTE — Progress Notes (Signed)
Patient vomited approx. 50 cc 45 minutes after receiving PO meds this morning.  Unable to assess what meds she retained.

## 2013-12-16 NOTE — Progress Notes (Addendum)
Patient Profile: 58 y/o AAF with h/o single vessel CABG (LIMA -LAD) and mechanical AVR for severe AS 03/15/2013, ESRD on HD and h/o liver transplant at [facility] in 2011, admitted for unstable angina.    Subjective: Currently CP free on IV heparin and IV NTG  Objective: Vital signs in last 24 hours: Temp:  [97.4 F (36.3 C)-98.5 F (36.9 C)] 97.6 F (36.4 C) (11/06 0847) Pulse Rate:  [62-77] 62 (11/06 0847) Resp:  [16-22] 18 (11/06 0847) BP: (83-165)/(29-73) 165/73 mmHg (11/06 0847) SpO2:  [93 %-100 %] 100 % (11/06 0847) Weight:  [160 lb (72.576 kg)-163 lb 2.3 oz (74 kg)] 163 lb 2.3 oz (74 kg) (11/05 2345) Last BM Date: 12/14/13  Intake/Output from previous day: 11/05 0701 - 11/06 0700 In: 405 [P.O.:240; I.V.:165] Out: -  Intake/Output this shift:    Medications Current Facility-Administered Medications  Medication Dose Route Frequency Provider Last Rate Last Dose  . 0.9 %  sodium chloride infusion  250 mL Intravenous PRN Brittainy Erie Noe, PA-C      . 0.9 %  sodium chloride infusion   Intravenous Continuous Consuelo Pandy, PA-C 10 mL/hr at 12/16/13 0635 10 mL/hr at 12/16/13 0635  . 0.9 %  sodium chloride infusion  250 mL Intravenous PRN Merton Border, MD      . acetaminophen (TYLENOL) tablet 650 mg  650 mg Oral Q6H PRN Merton Border, MD       Or  . acetaminophen (TYLENOL) suppository 650 mg  650 mg Rectal Q6H PRN Merton Border, MD      . albuterol (PROVENTIL) (2.5 MG/3ML) 0.083% nebulizer solution 2.5 mg  2.5 mg Nebulization Q2H PRN Merton Border, MD      . amLODipine (NORVASC) tablet 10 mg  10 mg Oral Daily Merton Border, MD      . Derrill Memo ON 12/17/2013] aspirin EC tablet 81 mg  81 mg Oral Daily Merton Border, MD      . calcium carbonate (TUMS - dosed in mg elemental calcium) chewable tablet 200 mg of elemental calcium  1 tablet Oral Daily Merton Border, MD      . cinacalcet (SENSIPAR) tablet 30 mg  30 mg Oral QPC breakfast Merton Border, MD      . DULoxetine (CYMBALTA) DR capsule 60 mg  60 mg Oral  QHS Merton Border, MD   60 mg at 12/15/13 2255  . gabapentin (NEURONTIN) capsule 300 mg  300 mg Oral QHS Merton Border, MD   300 mg at 12/15/13 2119  . heparin ADULT infusion 100 units/mL (25000 units/250 mL)  850 Units/hr Intravenous Continuous Cassie L Stewart, RPH 8.5 mL/hr at 12/16/13 0700 850 Units/hr at 12/16/13 0700  . hydrALAZINE (APRESOLINE) tablet 25 mg  25 mg Oral TID Merton Border, MD   25 mg at 12/15/13 2256  . HYDROmorphone (DILAUDID) injection 1 mg  1 mg Intravenous Q4H PRN Merton Border, MD   1 mg at 12/16/13 0423  . insulin aspart (novoLOG) injection 0-15 Units  0-15 Units Subcutaneous TID WC Merton Border, MD   5 Units at 12/16/13 0745  . insulin aspart (novoLOG) injection 0-5 Units  0-5 Units Subcutaneous QHS Merton Border, MD   0 Units at 12/15/13 2055  . levothyroxine (SYNTHROID, LEVOTHROID) tablet 150 mcg  150 mcg Oral QAC breakfast Merton Border, MD   150 mcg at 12/16/13 [id number]  . LORazepam (ATIVAN) tablet 0.5 mg  0.5 mg Oral QHS PRN Merton Border, MD      . metoprolol (LOPRESSOR) tablet 50  mg  50 mg Oral BID Merton Border, MD   50 mg at 12/15/13 2256  . multivitamin with minerals tablet 1 tablet  1 tablet Oral QHS Merton Border, MD   1 tablet at 12/15/13 2119  . nitroGLYCERIN 50 mg in dextrose 5 % 250 mL (0.2 mg/mL) infusion  0-200 mcg/min Intravenous Titrated Merton Border, MD 3 mL/hr at 12/16/13 0700 10 mcg/min at 12/16/13 0700  . ondansetron (ZOFRAN) tablet 4 mg  4 mg Oral Q6H PRN Merton Border, MD       Or  . ondansetron [facility]) injection 4 mg  4 mg Intravenous Q6H PRN Merton Border, MD   4 mg at 12/16/13 0606  . promethazine (PHENERGAN) tablet 12.5 mg  12.5 mg Oral Q4H PRN Merton Border, MD      . sodium chloride 0.9 % injection 3 mL  3 mL Intravenous Q12H Brittainy Erie Noe, PA-C   3 mL at 12/15/13 2057  . sodium chloride 0.9 % injection 3 mL  3 mL Intravenous PRN Brittainy M Simmons, PA-C      . sodium chloride 0.9 % injection 3 mL  3 mL Intravenous Q12H Merton Border, MD   3 mL at 12/15/13 2057  . sodium  chloride 0.9 % injection 3 mL  3 mL Intravenous Q12H Merton Border, MD   3 mL at 12/15/13 2056  . sodium chloride 0.9 % injection 3 mL  3 mL Intravenous PRN Merton Border, MD      . sucroferric oxyhydroxide [facility]) chewable tablet 500 mg  500 mg Oral TID WC Merton Border, MD   500 mg at 12/16/13 0746  . tacrolimus (PROGRAF) capsule 3 mg  3 mg Oral BID Merton Border, MD   3 mg at 12/15/13 2118  . traMADol (ULTRAM) tablet 50 mg  50 mg Oral Q12H PRN Merton Border, MD   50 mg at 12/15/13 2123  . zolpidem (AMBIEN) tablet 5 mg  5 mg Oral QHS PRN Merton Border, MD        PE: General appearance: alert, cooperative and no distress Lungs: clear to auscultation bilaterally Heart: regular rate and rhythm and crisp audible mechanical valve sounds Extremities: no LEE Pulses: 2+ and symmetric Skin: warm and dry Neurologic: Grossly normal  Lab Results:   Recent Labs  12/15/13 1342 12/16/13 0356  WBC 5.3 5.1  HGB 12.6 11.4*  HCT 38.6 36.6  PLT 140* 138*   BMET  Recent Labs  12/15/13 1342 12/16/13 0356  NA 140 142  K 5.3 4.8  CL 94* 97  CO2 28 28  GLUCOSE 236* 173*  BUN 16 29*  CREATININE 4.25* 6.19*  CALCIUM 8.2* 7.9*   PT/INR  Recent Labs  12/15/13 1342 12/16/13 0356  LABPROT 19.7* 21.8*  INR 1.65* 1.88*     Assessment/Plan    Principal Problem:   Unstable angina Active Problems:   End stage renal disease on dialysis-TTS   CAD- s/p LIMA-LAD Feb. 2015   Aortic stenosis, moderate-severe at cath Jan 2015. s/p Mechanical AVR 03/2013   Dyslipidemia-LDL 104, not on statin with Hx of liver transplant   Chest pain   Tobacco abuse   S/P mechanical AVR Feb 2015   Chronic anticoagulation w/ coumadin, goal INR 2.5-3.0 w/ AVR   S/P CABG x 1, LIMA-LAD 03/2013   DM type 2 (diabetes mellitus, type 2)  1. Unstable Angina/CAD: Currently CP free on IV heparin and IV NTG. Plan for LHC to redefine coronary anatomy and LIMA patency. Will defer cath  until INR is <1.7. 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   2. H/O Mechanical AVR: on IV heparin for subtherapeutic INR. Continue coumadin with heparin bridge post cath. INR goal 2.5-3.5.  3. ESRD: HD per nephrology  4. DM: per [facility]    LOS: 1 day    Brittainy M. Ladoris Gene 12/16/2013 9:38 AM  Patient seen and examined. Agree with assessment and plan. Pt well known to me. She developed significant hypotension yesterday during dialysis with subsequent development of chest pain later that was nitrate responsive. Agree with plans for repeat cath. INR earlier today has increased to 1.88; will recheck and probably cath later today.   Troy Sine, MD, [facility] 12/16/2013 10:20 AM

## 2013-12-16 NOTE — Progress Notes (Signed)
Patient ID: [name]  female  QPY:[id number]    DOB: March 07, 1955    DOA: 12/15/2013  PCP: Charlynn Court, NP  Brief history of present illness  The patient is a 58 year old female with past medical history for CAD s/p LIMA to LAD, aortic stenosis s/p AVR in 2015, hepatitis C s/p liver transplant in 2011, [place], ESRD on HD, presented with chest pain episodes occurring during hemodialysis. EKG showed new T-wave inversions in the inferior leads. Cardiology was consulted and patient was started on heparin and nitroglycerin drip. Plan for cardiac cath on 11/6.d-dimer negative   Assessment/Plan: Principal Problem:   Unstable angina with history of CAD- s/p LIMA-LAD Feb. 2015,  Aortic stenosis, moderate-severe at cath Jan 2015. s/p Mechanical AVR 03/2013 - continue IV heparin drip, nitroglycerin drip, chest pain resolved - Cardiology consulted, plan for cardiac cath today - D-dimer negative, serial cardiac enzymes negative so far  Active Problems:   End stage renal disease on dialysis-TTS - Called renal consult, patient is on hemodialysis, TTS    Dyslipidemia-LDL 104, not on statin with Hx of liver transplant    Chronic anticoagulation w/ coumadin, goal INR 2.5-3.0 w/ AVR - currently on heparin drip    DM type 2 (diabetes mellitus, type 2) - continue sliding scale insulin, currently nothing by mouth, will follow CBGs   DVT Prophylaxis:heparin drip Code Status:full code  Family Communication:  Disposition:  Consultants:  cardiology  Procedures:  none  Antibiotics:  none    Subjective: Patient seen and examined, chest pain has improved, no complaints at the time, no fevers or chills  Objective: Weight change:   Intake/Output Summary (Last 24 hours) at 12/16/13 1035 Last data filed at 12/16/13 0700  Gross per 24 hour  Intake    405 ml  Output      0 ml  Net    405 ml   Blood pressure 165/73, pulse 62, temperature 97.6 F (36.4 C), temperature source  Oral, resp. rate 18, height 5\' 4"  (1.626 m), weight 74 kg (163 lb 2.3 oz), SpO2 100 %.  Physical Exam: General: Alert and awake, oriented x3, not in any acute distress. CVS: S1-S2 clear, no murmur rubs or gallops, +mechanical valve sounds Chest: clear to auscultation bilaterally, no wheezing, rales or rhonchi Abdomen: soft nontender, nondistended, normal bowel sounds  Extremities: no cyanosis, clubbing or edema noted bilaterally Neuro: Cranial nerves II-XII intact, no focal neurological deficits  Lab Results: Basic Metabolic Panel:  Recent Labs Lab 12/15/13 1342 12/16/13 0356  NA 140 142  K 5.3 4.8  CL 94* 97  CO2 28 28  GLUCOSE 236* 173*  BUN 16 29*  CREATININE 4.25* 6.19*  CALCIUM 8.2* 7.9*   Liver Function Tests:  Recent Labs Lab 12/15/13 1342  AST 76*  ALT 39*  ALKPHOS 95  BILITOT 0.4  PROT 7.9  ALBUMIN 3.1*   No results for input(s): LIPASE, AMYLASE in the last 168 hours. No results for input(s): AMMONIA in the last 168 hours. CBC:  Recent Labs Lab 12/15/13 1342 12/16/13 0356  WBC 5.3 5.1  HGB 12.6 11.4*  HCT 38.6 36.6  MCV 101.6* 104.3*  PLT 140* 138*   Cardiac Enzymes:  Recent Labs Lab 12/15/13 2120 12/16/13 0356 12/16/13 0908  TROPONINI <0.30 <0.30 <0.30   BNP: Invalid input(s): POCBNP CBG:  Recent Labs Lab 12/15/13 1354 12/15/13 1949 12/16/13 0641  GLUCAP 237* 197* 221*     Micro Results: Recent Results (from the past 240 hour(s))  MRSA PCR Screening     Status: None   Collection Time: 12/15/13  7:47 PM  Result Value Ref Range Status   MRSA by PCR NEGATIVE NEGATIVE Final    Comment:        The GeneXpert MRSA Assay (FDA approved for NASAL specimens only), is one component of a comprehensive MRSA colonization surveillance program. It is not intended to diagnose MRSA infection nor to guide or monitor treatment for MRSA infections.     Studies/Results: Dg Chest Portable 1 View  12/15/2013   EXAM: PORTABLE CHEST - 1  VIEW  COMPARISON:  None.  FINDINGS: The heart size and mediastinal contours are within normal limits. Both lungs are clear. The visualized skeletal structures are unremarkable.  IMPRESSION: No active disease.   Electronically Signed   By: Franchot Gallo M.D.   On: 12/15/2013 14:27    Medications: Scheduled Meds: . amLODipine  10 mg Oral Daily  . [START ON 12/17/2013] aspirin EC  81 mg Oral Daily  . calcium carbonate  1 tablet Oral Daily  . cinacalcet  30 mg Oral QPC breakfast  . DULoxetine  60 mg Oral QHS  . gabapentin  300 mg Oral QHS  . hydrALAZINE  25 mg Oral TID  . insulin aspart  0-15 Units Subcutaneous TID WC  . insulin aspart  0-5 Units Subcutaneous QHS  . levothyroxine  150 mcg Oral QAC breakfast  . metoprolol  50 mg Oral BID  . multivitamin with minerals  1 tablet Oral QHS  . sodium chloride  3 mL Intravenous Q12H  . sodium chloride  3 mL Intravenous Q12H  . sodium chloride  3 mL Intravenous Q12H  . sucroferric oxyhydroxide  500 mg Oral TID WC  . tacrolimus  3 mg Oral BID      LOS: 1 day   Dov Dill M.D. [facility] 12/16/2013, 10:35 AM Pager: [phone]  If 7PM-7AM, please contact night-coverage [email or web address] Password [email or web address]

## 2013-12-16 NOTE — Progress Notes (Signed)
Patient called and stated she was bleeding; upon walking in I immediately started holding pressure; held pressure for 20 minutes and notified cath lab and MD; MD ordered to hold coumadin and to change heparin to start in 12 hrs instead of 8; notified pharmacy; pt is currently resting in bed with call bell within reach; will continue to monitor.  Rowe Pavy, RN

## 2013-12-16 NOTE — Consult Note (Signed)
[facility] Renal Consultation Note    Indication for Consultation:  Management of ESRD/hemodialysis; anemia, hypertension/volume and secondary hyperparathyroidism PCP:  HPI: [name] is a 58 y.o. female with ESRD (TTS [place]) since liver transplant 2011 for Hep C, CABG with AVR on chronic coumadin 03/2013, HTN, COPD, PVD, chronic constipation who presented to the ED yesterday with chest pain/SOB that started on HD yesterday and was associated with BP drop - initial goal was 6.2 with pt having gained 3.2 kg since her Tuesday treatment.  EDW was increased 1 kg to 73.5.  Her post HD wt Thursday was 74.3 with sitting BP of 130/72 and standing 125/59 P 73.  She walked out of the dialysis unit without difficulty.  CP worsened at home and she subsequently presented to the ED. Today she is complaining of nausea without vomiting; she had a small BM yesterday and has problems with chronic constipation. Nausea is worse when she hasn't had adequate BMs.. She manages bowels via miralax, golytely prn and occ suppositories.  She denies CP today, is breathing easily, but feels like Neurontin isn't helping her hand and feet neuropathy.  Appetite has been good lately and she's been gaining weight. Nausea is her biggest problem at present. She has been taking sensipar and tums but no velphoro.  Past Medical History  Diagnosis Date  . 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 Chronic back pain   . CAD (coronary artery disease)   . Obesity   . Peripheral vascular disease hands and legs  . Anxiety   . Asthma   . GERD (gastroesophageal reflux disease)     takes Omeprazole daily  . Chronic constipation     takes MIralax and Colace daily  . Anemia     takes Folic Acid daily  . Hypothyroidism     takes Synthroid daily  . Depression     takes Cymbalta for "severe" depression  . Neuromuscular disorder     carpal tunnel in  right hand  . Hypertension     takes Metoprolol and Lisinopril daily, sees Dr Bea Graff  . COPD (chronic obstructive pulmonary disease)   . Pneumonia     "today and several times before" (08/30/2012)  . Chronic bronchitis     "q yr w/season changes" (08/30/2012)  . Type II diabetes mellitus     Levemir 2units daily if > 150  . History of blood transfusion     "several" (08/30/2012)  . Hepatitis C   . Migraine     "last migraine was in 2013" (08/30/2012)  . Headache     "at least monthly" (08/30/2012)  . Arthritis     "left hand, back" (08/30/2012)  . End stage renal disease on dialysis 02/27/2011    Kidneys shut down at time of liver transplant in Sept 2011 at [facility] in [place], she has been on HD ever since.  Dialyzes at [facility] HD on TTS schedule.  Had L forearm graft used 10 months then removed Dec 2012 due to suspected infection.  A right upper arm AVG was placed Dec 2012 but she developed steal symptoms acutely and it was ligated the same day.  Never had an AV fistula due to small veins.  Now has L thigh AVG put in Jan 2013, has not clotted to date.    [name] CAD (coronary artery disease) Jan. 2015  Cath: 20% LAD, 50% D1; s/p LIMA-LAD  . Aortic stenosis   . Tobacco abuse   . S/P aortic valve replacement 03/15/13    Mechanical    Past Surgical History  Procedure Laterality Date  . Liver transplant  10/25/2009    sees Dr Ferol Luz 1 every 6 months, saw last in Dec 2013. [name] Coord [phone]  . Small intestine surgery  90's  . Thrombectomy    . Arteriovenous graft placement Left 10/03/10     forearm  . Avgg removal  12/23/2010    Procedure: REMOVAL OF ARTERIOVENOUS GORETEX GRAFT ([place]);  Surgeon: Elam Dutch, MD;  Location: [place];  Service: Vascular;  Laterality: Left;  procedure started @1736 -1852  . Insertion of dialysis catheter  12/23/2010    Procedure: INSERTION OF DIALYSIS CATHETER;  Surgeon: Elam Dutch, MD;  Location: [place];  Service: Vascular;  Laterality:  Right;  Right Internal Jugular 28cm dialysis catheter insertion procedure time 1701-1720   . Cholecystectomy  1993  . Cystoscopy  1990's  . Spinal growth rods  2010    "put 2 metal rods in my back; they had detetriorated" (08/30/2012)  . Av fistula placement  01/29/2011    Procedure: INSERTION OF ARTERIOVENOUS (AV) GORE-TEX GRAFT ARM;  Surgeon: Elam Dutch, MD;  Location: [place];  Service: Vascular;  Laterality: Right;  . Av fistula placement  03/10/2011    Procedure: INSERTION OF ARTERIOVENOUS (AV) GORE-TEX GRAFT THIGH;  Surgeon: Elam Dutch, MD;  Location: [place];  Service: Vascular;  Laterality: Left;  . Tubal ligation  1990's  . Cardiac catheterization  2014  . Aortic valve replacement N/A 03/15/2013    AVR; Surgeon: Ivin Poot, MD;  Location: [facility] OR; Open Heart Surgery;  4mm[employer] mechanical prosthesis, top hat valve  . Coronary artery bypass graft N/A 03/15/2013    Procedure: CORONARY ARTERY BYPASS GRAFTING (CABG) times one using left internal mammary artery.;  Surgeon: Ivin Poot, MD;  Location: [place];  Service: Open Heart Surgery;  Laterality: N/A;  POSS CABG X 1  . Intraoperative transesophageal echocardiogram N/A 03/15/2013    Procedure: INTRAOPERATIVE TRANSESOPHAGEAL ECHOCARDIOGRAM;  Surgeon: Ivin Poot, MD;  Location: [place];  Service: Open Heart Surgery;  Laterality: N/A;   Family History  Problem Relation Age of Onset  . Cancer Mother   . Diabetes Mother   . Hypertension Mother   . Stroke Mother   . Cancer Father   . Anesthesia problems Neg Hx   . Hypotension Neg Hx   . Malignant hyperthermia Neg Hx   . Pseudochol deficiency Neg Hx    Social History:  reports that she quit smoking about 9 months ago. Her smoking use included Cigarettes. She has a 30 pack-year smoking history. She has never used smokeless tobacco. She reports that she drinks alcohol. She reports that she does not use illicit drugs. Allergies  Allergen Reactions  . Acetaminophen Other  (See Comments)    Liver transplant recipient   . Codeine Itching  . Mirtazapine Other (See Comments)    hallucination  . Morphine Itching   Prior to Admission medications   Medication Sig Start Date End Date Taking? Authorizing Provider  albuterol (PROVENTIL,VENTOLIN) 90 MCG/ACT inhaler Inhale 1-2 puffs into the lungs every 6 (six) hours as needed for wheezing or shortness of breath.    Yes Historical Provider, MD  aspirin EC 81 MG tablet Take 81 mg by mouth daily.     Yes Historical Provider,  MD  calcium carbonate (TUMS - DOSED IN MG ELEMENTAL CALCIUM) 500 MG chewable tablet Chew 1 tablet by mouth daily.   Yes Historical Provider, MD  cinacalcet (SENSIPAR) 30 MG tablet Take 30 mg by mouth daily.   Yes Historical Provider, MD  gabapentin (NEURONTIN) 300 MG capsule Take 300 mg by mouth at bedtime.    Yes Historical Provider, MD  insulin detemir (LEVEMIR) 100 UNIT/ML injection Inject 2-5 Units into the skin at bedtime. 150-200 do 2 units >200 do 5 units   Yes Historical Provider, MD  levothyroxine (SYNTHROID, LEVOTHROID) 150 MCG tablet Take 150 mcg by mouth daily before breakfast.   Yes Historical Provider, MD  LORazepam (ATIVAN) 0.5 MG tablet Take 0.5 mg by mouth at bedtime as needed for sleep.  02/08/13  Yes Historical Provider, MD  metoprolol (LOPRESSOR) 50 MG tablet Take 50 mg by mouth 2 (two) times daily.   Yes Historical Provider, MD  Multiple Vitamin (MULTIVITAMIN WITH MINERALS) TABS tablet Take 1 tablet by mouth at bedtime.    Yes Historical Provider, MD  ondansetron (ZOFRAN-ODT) 4 MG disintegrating tablet Take 4 mg by mouth every 8 (eight) hours as needed for nausea or vomiting.   Yes Historical Provider, MD  polyethylene glycol (MIRALAX / GLYCOLAX) packet Take 17 g by mouth as needed.   Yes Historical Provider, MD  promethazine (PHENERGAN) 12.5 MG tablet Take 12.5 mg by mouth every 4 (four) hours as needed for nausea.  02/09/13  Yes Historical Provider, MD  tacrolimus (PROGRAF) 1 MG  capsule Take 3 mg by mouth 2 (two) times daily.    Yes Historical Provider, MD  warfarin (COUMADIN) 2 MG tablet Take 1.5 to 2 tablets by mouth daily as directed by coumadin clinic Patient taking differently: Take 3-4 mg by mouth daily. Take mg Tuesday through Sunday and on Monday take 4mg  12/14/13  Yes [name], MD  amLODipine (NORVASC) 10 MG tablet Take 10 mg by mouth daily.    Historical Provider, MD  DULoxetine (CYMBALTA) 60 MG capsule Take 60 mg by mouth at bedtime.     Historical Provider, MD  hydrALAZINE (APRESOLINE) 25 MG tablet Take 25 mg by mouth 3 (three) times daily.    Historical Provider, MD  lidocaine (LIDODERM) 5 % Place 1 patch onto the skin daily. Remove & Discard patch within 12 hours or as directed by MD    Historical Provider, MD  promethazine (PHENERGAN) 25 MG tablet Take 1 tablet (25 mg total) by mouth every 6 (six) hours as needed for nausea or vomiting. 11/03/13   Jarrett Soho Muthersbaugh, PA-C  traMADol (ULTRAM) 50 MG tablet Take 50 mg by mouth every 12 (twelve) hours as needed for moderate pain.  02/09/13   Historical Provider, MD  VELPHORO 500 MG chewable tablet Chew 1 tablet by mouth 3 (three) times daily with meals.  06/10/13   Historical Provider, MD   Current Facility-Administered Medications  Medication Dose Route Frequency Provider Last Rate Last Dose  . 0.9 %  sodium chloride infusion  250 mL Intravenous PRN Brittainy Erie Noe, PA-C      . 0.9 %  sodium chloride infusion   Intravenous Continuous Consuelo Pandy, PA-C 10 mL/hr at 12/16/13 0635 10 mL/hr at 12/16/13 0635  . 0.9 %  sodium chloride infusion  250 mL Intravenous PRN Merton Border, MD      . acetaminophen (TYLENOL) tablet 650 mg  650 mg Oral Q6H PRN Merton Border, MD       Or  .  acetaminophen (TYLENOL) suppository 650 mg  650 mg Rectal Q6H PRN Merton Border, MD      . albuterol (PROVENTIL) (2.5 MG/3ML) 0.083% nebulizer solution 2.5 mg  2.5 mg Nebulization Q2H PRN Merton Border, MD      . amLODipine (NORVASC)  tablet 10 mg  10 mg Oral Daily Merton Border, MD   10 mg at 12/16/13 1056  . [START ON 12/17/2013] aspirin EC tablet 81 mg  81 mg Oral Daily Merton Border, MD      . calcium carbonate (TUMS - dosed in mg elemental calcium) chewable tablet 200 mg of elemental calcium  1 tablet Oral Daily Merton Border, MD      . cinacalcet (SENSIPAR) tablet 30 mg  30 mg Oral QPC breakfast Merton Border, MD   30 mg at 12/16/13 1055  . DULoxetine (CYMBALTA) DR capsule 60 mg  60 mg Oral QHS Merton Border, MD   60 mg at 12/15/13 2255  . gabapentin (NEURONTIN) capsule 300 mg  300 mg Oral QHS Merton Border, MD   300 mg at 12/15/13 2119  . heparin ADULT infusion 100 units/mL (25000 units/250 mL)  850 Units/hr Intravenous Continuous Cassie L Stewart, RPH 8.5 mL/hr at 12/16/13 0700 850 Units/hr at 12/16/13 0700  . hydrALAZINE (APRESOLINE) tablet 25 mg  25 mg Oral TID Merton Border, MD   25 mg at 12/16/13 1056  . HYDROmorphone (DILAUDID) injection 1 mg  1 mg Intravenous Q4H PRN Merton Border, MD   1 mg at 12/16/13 0423  . insulin aspart (novoLOG) injection 0-15 Units  0-15 Units Subcutaneous TID WC Merton Border, MD   5 Units at 12/16/13 0745  . insulin aspart (novoLOG) injection 0-5 Units  0-5 Units Subcutaneous QHS Merton Border, MD   0 Units at 12/15/13 2055  . levothyroxine (SYNTHROID, LEVOTHROID) tablet 150 mcg  150 mcg Oral QAC breakfast Merton Border, MD   150 mcg at 12/16/13 [phone]  . LORazepam (ATIVAN) tablet 0.5 mg  0.5 mg Oral QHS PRN Merton Border, MD      . metoprolol (LOPRESSOR) tablet 50 mg  50 mg Oral BID Merton Border, MD   50 mg at 12/16/13 1055  . multivitamin with minerals tablet 1 tablet  1 tablet Oral QHS Merton Border, MD   1 tablet at 12/15/13 2119  . nitroGLYCERIN 50 mg in dextrose 5 % 250 mL (0.2 mg/mL) infusion  0-200 mcg/min Intravenous Titrated Merton Border, MD 3 mL/hr at 12/16/13 0700 10 mcg/min at 12/16/13 0700  . ondansetron (ZOFRAN) tablet 4 mg  4 mg Oral Q6H PRN Merton Border, MD       Or  . ondansetron [facility]) injection 4 mg  4 mg  Intravenous Q6H PRN Merton Border, MD   4 mg at 12/16/13 0606  . promethazine (PHENERGAN) tablet 12.5 mg  12.5 mg Oral Q4H PRN Merton Border, MD      . sodium chloride 0.9 % injection 3 mL  3 mL Intravenous Q12H Brittainy Erie Noe, PA-C   3 mL at 12/15/13 2057  . sodium chloride 0.9 % injection 3 mL  3 mL Intravenous PRN Brittainy M Simmons, PA-C      . sodium chloride 0.9 % injection 3 mL  3 mL Intravenous Q12H Merton Border, MD   3 mL at 12/15/13 2057  . sodium chloride 0.9 % injection 3 mL  3 mL Intravenous Q12H Merton Border, MD   3 mL at 12/15/13 2056  . sodium chloride 0.9 % injection 3 mL  3  mL Intravenous PRN Merton Border, MD      . sucroferric oxyhydroxide [facility]) chewable tablet 500 mg  500 mg Oral TID WC Merton Border, MD   500 mg at 12/16/13 0746  . tacrolimus (PROGRAF) capsule 3 mg  3 mg Oral BID Merton Border, MD   3 mg at 12/15/13 2118  . traMADol (ULTRAM) tablet 50 mg  50 mg Oral Q12H PRN Merton Border, MD   50 mg at 12/15/13 2123  . zolpidem (AMBIEN) tablet 5 mg  5 mg Oral QHS PRN Merton Border, MD       Labs: Basic Metabolic Panel:  Recent Labs Lab 12/15/13 1342 12/16/13 0356  NA 140 142  K 5.3 4.8  CL 94* 97  CO2 28 28  GLUCOSE 236* 173*  BUN 16 29*  CREATININE 4.25* 6.19*  CALCIUM 8.2* 7.9*   Liver Function Tests:  Recent Labs Lab 12/15/13 1342  AST 76*  ALT 39*  ALKPHOS 95  BILITOT 0.4  PROT 7.9  ALBUMIN 3.1*  CBC:  Recent Labs Lab 12/15/13 1342 12/16/13 0356  WBC 5.3 5.1  HGB 12.6 11.4*  HCT 38.6 36.6  MCV 101.6* 104.3*  PLT 140* 138*   Cardiac Enzymes:  Recent Labs Lab 12/15/13 2120 12/16/13 0356 12/16/13 0908  TROPONINI <0.30 <0.30 <0.30   CBG:  Recent Labs Lab 12/15/13 1354 12/15/13 1949 12/16/13 0641  GLUCAP 237* 197* 221*   Lab Results  Component Value Date   INR 1.88* 12/16/2013   INR 1.65* 12/15/2013   INR 1.65 12/08/2013    Studies/Results: Dg Chest Portable 1 View  12/15/2013   EXAM: PORTABLE CHEST - 1 VIEW  COMPARISON:  None.   FINDINGS: The heart size and mediastinal contours are within normal limits. Both lungs are clear. The visualized skeletal structures are unremarkable.  IMPRESSION: No active disease.   Electronically Signed   By: Franchot Gallo M.D.   On: 12/15/2013 14:27    ROS: As per HPI otherwise negative. Physical Exam: Filed Vitals:   12/16/13 0500 12/16/13 0530 12/16/13 0600 12/16/13 0847  BP: 120/49 155/47 145/55 165/73  Pulse: 67 66 65 62  Temp:    97.6 F (36.4 C)  TempSrc:    Oral  Resp:   20 18  Height:      Weight:      SpO2: 97% 97% 98% 100%     General: slender chronically ill appearing AA woman  Head: Normocephalic, atraumatic, sclera non-icteric, mucus membranes are moist Neck: Supple. JVD not elevated. Lungs: Clear bilaterally to auscultation without wheezes, rales, or rhonchi. Breathing is unlabored. Heart: RRR with S1 S2. No murmurs, + valve click Abdomen: Soft, non-tender, non-distended with normoactive bowel sounds. No rebound/guarding. No obvious abdominal masses. M-S:  Strength and tone appear normal for age. Lower extremities:without edema or ischemic changes, no open wounds  Neuro: Alert and oriented X 3. Moves all extremities spontaneously. Psych:  Responds to questions appropriately with a normal affect. Dialysis Access: left thigh AVGG + bruit  Dialysis Orders:  TTS [name] 3.75 hr 180 400/A 1.5 2 K 2.25 Ca EDW 73.5 AVGG NO heparin  Aranesp 40 last given 11/3 no Fe calcitriol 0.25 (prev on 2 Hectorol) Recent labs:  Hgb 11 10/29 with 48% sat ferritin 1690 iPTH 403 up from 100s when on 2 of Hectorol; Ca/P ok  Assessment/Plan: 1. Chest pain - hx of CABG/AVR 03/2013 - volume high on HD lately with high IDWG - has gained some EDW, troponin's neg, EKG showed  new T wave inversions in the inf leads; cards planning cath today- on hep and NTG drip 2. ESRD -  TTS - continue usual orders - new outpt EDW 73.5 - needs orthostatics pre and post HD on Sat. 3. Hypertension/volume  - vol ok  - CXR neg on admission; BP higher than usual 4. Anemia  - continue weekly Aranesp - due next week 5. Metabolic bone disease -  Recent changed from 2 Hectorol to 0.25 of calcitriol - iPTH up to 403 with Ca on the low side - will increase calcitriol to 0.5 TIW- stop velphoro (not taking); continue tums and sensipar 6. Nutrition - on CL  7. Chronic coumadin - INR too high for cath - rechecking this afternoon - not therapeutic on admission -  8. Chronic constipation - resume miralax 9. Nausea- advised ok for ESRD pt to use phenergan since zofran not helping  Myriam Jacobson, PA-C [place] [phone] 12/16/2013, 10:57 AM   Pt seen, examined and agree w A/P as above.  [name] MD pager [phone]    cell [phone] 12/16/2013, 12:47 PM

## 2013-12-16 NOTE — Progress Notes (Signed)
Site area: right groin a 5 french sheath was removed  Site Prior to Removal:  Level 0  Pressure Applied For 20 MINUTES    Minutes Beginning at 1545  Manual:   Yes.    Patient Status During Pull:  stable  Post Pull Groin Site:  Level 0  Post Pull Instructions Given:  Yes.    Post Pull Pulses Present:  Yes.    Dressing Applied:  Yes.    Comments:  VS remain stable during sheath pull.  Pt denies any discomfort at this time.

## 2013-12-16 NOTE — CV Procedure (Signed)
[name] is a 58 y.o. female    [id number]  [id number] LOCATION:  FACILITY: [facility]  PHYSICIAN: Troy Sine, MD, [facility] 20-Dec-1955   DATE OF PROCEDURE:  12/16/2013    CARDIAC CATHETERIZATION     HISTORY:    [name] is a 58 y.o. female who underwent mechanical aortic valve replacement for severe aortic stenosis on 03/15/2013 and had a LIMA placed to her LAD.  She has end-stage renal disease on hemodialysis and has remote history of liver transplantation.  She was admitted to the hospital last evening after developing significant hypotension during dialysis, and episodes of recurrent chest tightness after dialysis.  She is now referred for repeat cardiac catheterization.   PROCEDURE:  Left heart catheterization: coronary angiography in native coronary arteries and selective angiography into the left internal mammary artery, left ventriculography not performed performed due to mechanical aortic valve.   The patient was brought to the [facility] cardiac catherization laboratory in the fasting state. She was premedicated with Versed 1 mg and fentanyl 25 g. His Her right groin was prepped and shaved in usual sterile fashion. Xylocaine 1% was used for local anesthesia. A 5 French sheath was inserted into the R femoral artery. Diagnostic catheterizatiion was done with 5 [place] FL4, and FR4.  The mechanical aortic valve was not crossed and left ventriculography was not performed. vHemostasis was obtained by direct manual compression. The patient tolerated the procedure well.   HEMODYNAMICS:   Central Aorta: 103/50    ANGIOGRAPHY:  Left main: Angiographically normal and bifurcated into the LAD and left circumflex coronary artery  LAD: Moderate size vessel that had a focal 70% stenosis after the takeoff of the second diagonal vessel.  There was competitive filling seen in the mid distal LAD due to competitive left internal mammary artery flow  Left circumflex: Moderate size  vessel that had 20% smooth narrowing in the second marginal branch  Right coronary artery: Large dominant vessel which supplied a large PDA and PLA system and was free of significant disease.  LIMA to LAD: Widely patent and anastomosed into the mid LAD.  The distal LAD was small caliber but was free of significant obstructive disease  Total contrast: 55 cc Omnipaque  IMPRESSION:  Predominant single-vessel coronary artery disease with focal 70% mid LAD stenosis after the takeoff of the second diagonal vessel; 20% smooth narrowing in a marginal branch of the circumflex vessel; normal dominant right coronary artery.  Patent left internal mammary artery graft to the mid LAD.  RECOMMENDATION:  Medical therapy.  Troy Sine, MD, [facility] 12/16/2013 3:19 PM

## 2013-12-16 NOTE — Progress Notes (Addendum)
UR completed [place] Day K. Laneya Gasaway, RN, BSN, [place], CCM  12/16/2013 11:28 AM

## 2013-12-16 NOTE — Progress Notes (Signed)
ANTICOAGULATION CONSULT NOTE - Follow Up Consult  Pharmacy Consult for heparin Indication: mechanical heart valve  Allergies  Allergen Reactions  . Acetaminophen Other (See Comments)    Liver transplant recipient   . Codeine Itching  . Mirtazapine Other (See Comments)    hallucination  . Morphine Itching    Patient Measurements: Height: 5\' 4"  (162.6 cm) Weight: 163 lb 2.3 oz (74 kg) IBW/kg (Calculated) : 54.7 Heparin Dosing Weight:   Vital Signs: Temp: 97.4 F (36.3 C) (11/06 0419) Temp Source: Oral (11/06 0419) BP: 145/55 mmHg (11/06 0600) Pulse Rate: 65 (11/06 0600)  Labs:  Recent Labs  12/15/13 1342 12/15/13 2120 12/16/13 0035 12/16/13 0356  HGB 12.6  --   --  11.4*  HCT 38.6  --   --  36.6  PLT 140*  --   --  138*  LABPROT 19.7*  --   --  21.8*  INR 1.65*  --   --  1.88*  HEPARINUNFRC  --   --  0.36  --   CREATININE 4.25*  --   --  6.19*  TROPONINI  --  <0.30  --  <0.30    Estimated Creatinine Clearance: 9.6 mL/min (by C-G formula based on Cr of 6.19).   Medications:  Scheduled:  . amLODipine  10 mg Oral Daily  . [START ON 12/17/2013] aspirin EC  81 mg Oral Daily  . calcium carbonate  1 tablet Oral Daily  . cinacalcet  30 mg Oral QPC breakfast  . DULoxetine  60 mg Oral QHS  . gabapentin  300 mg Oral QHS  . hydrALAZINE  25 mg Oral TID  . insulin aspart  0-15 Units Subcutaneous TID WC  . insulin aspart  0-5 Units Subcutaneous QHS  . levothyroxine  150 mcg Oral QAC breakfast  . metoprolol  50 mg Oral BID  . multivitamin with minerals  1 tablet Oral QHS  . sodium chloride  3 mL Intravenous Q12H  . sodium chloride  3 mL Intravenous Q12H  . sodium chloride  3 mL Intravenous Q12H  . sucroferric oxyhydroxide  500 mg Oral TID WC  . tacrolimus  3 mg Oral BID   Infusions:  . sodium chloride 10 mL/hr (12/16/13 8592)  . heparin 850 Units/hr (12/16/13 0700)  . nitroGLYCERIN 10 mcg/min (12/16/13 0700)    Assessment: 58 yo female with mechanical heart  valve is currently on therapeutic heparin.  Heparin level is 0.33. Goal of Therapy:  Heparin level 0.3-0.7 units/ml Monitor platelets by anticoagulation protocol: Yes   Plan:  - continue heparin at 850 units/hr - daily heparin level and CBC - f/u on cath plan when INR <1.7 - f/u resume of coumadin after cath  Cindee Mclester, Tsz-Yin 12/16/2013,8:43 AM

## 2013-12-16 NOTE — Progress Notes (Addendum)
Inpatient Diabetes Program Recommendations  AACE/ADA: New Consensus Statement on Inpatient Glycemic Control (2013)  Target Ranges:  Prepandial:   less than 140 mg/dL      Peak postprandial:   less than 180 mg/dL (1-2 hours)      Critically ill patients:  140 - 180 mg/dL     Results for BLONNIE, MASKE (MRN [id number]) as of 12/16/2013 07:26  Ref. Range 12/15/2013 13:54 12/15/2013 19:49 12/16/2013 06:41  Glucose-Capillary Latest Range: 70-99 mg/dL 237 (H) 197 (H) 221 (H)    Results for TAYLER, HEIDEN (MRN [id number]) as of 12/16/2013 07:26  Ref. Range 12/15/2013 21:20  Hgb A1c MFr Bld Latest Range: <5.7 % 6.3 (H)     Good CBG control at home- A1C 6.3%.  Fasting glucose level elevated this AM.  Home DM Meds: Levemir 2-5 units QHS (2 units if CBG 150-200 mg/dl, 5 units if CBG >200 mg/dl).     MD- Please add Lantus to hospital regimen- Lantus 2 units QHS if CBG 150-200 mg/dl            Lantus 5 units QHS if CBG > 200 mg/dl     Will follow Wyn Quaker RN, MSN, CDE Diabetes Coordinator Inpatient Diabetes Program Team Pager: [phone] (8a-10p)

## 2013-12-16 NOTE — Care Management Note (Addendum)
    Page 1 of 1   12/20/2013     3:38:13 PM CARE MANAGEMENT NOTE 12/20/2013  Patient:  [name], [name]   Account Number:  [id number]  Date Initiated:  12/16/2013  Documentation initiated by:  Mariann Laster  Subjective/Objective Assessment:   CP     Action/Plan:   CM to follow for disposition needs   Anticipated DC Date:  12/21/2013   Anticipated DC Plan:  [place]  CM consult      Choice offered to / List presented to:             Status of service:  In process, will continue to follow Medicare Important Message given?  YES (If response is "NO", the following Medicare IM given date fields will be blank) Date Medicare IM given:  12/19/2013 Medicare IM given by:  Chele Cornell Date Additional Medicare IM given:   Additional Medicare IM given by:    Discharge Disposition:  HOME/SELF CARE  Per UR Regulation:  Reviewed for med. necessity/level of care/duration of stay  If discussed at [place] of Stay Meetings, dates discussed:   12/20/2013    Comments:  12/20/13 Ellan Lambert, RN, BSN [phone] Continue heparin drip.Marland KitchenMarland KitchenPlan dc when INR therapeutic.  Crystal Hutchinson RN, BSN, MSHL, CCM  Nurse - Case Manager,  (Unit [phone]  12/16/2013 Specialty Medication Review:  None identified - Insured Dispo Plan:  Home / Self care.

## 2013-12-16 NOTE — Progress Notes (Addendum)
ANTICOAGULATION CONSULT NOTE - Follow Up Consult  Pharmacy Consult for heparin and warfarin Indication: mechanical heart valve  Allergies  Allergen Reactions  . Acetaminophen Other (See Comments)    Liver transplant recipient   . Codeine Itching  . Mirtazapine Other (See Comments)    hallucination  . Morphine Itching    Patient Measurements: Height: 5\' 4"  (162.6 cm) Weight: 163 lb 2.3 oz (74 kg) IBW/kg (Calculated) : 54.7 Heparin Dosing Weight: 70kg  Vital Signs: Temp: 97.9 F (36.6 C) (11/06 1225) Temp Source: Oral (11/06 1225) BP: 161/35 mmHg (11/06 1225) Pulse Rate: 52 (11/06 1420)  Labs:  Recent Labs  12/15/13 1342 12/15/13 2120 12/16/13 0035 12/16/13 0356 12/16/13 0908 12/16/13 1239  HGB 12.6  --   --  11.4*  --   --   HCT 38.6  --   --  36.6  --   --   PLT 140*  --   --  138*  --   --   LABPROT 19.7*  --   --  21.8*  --  20.7*  INR 1.65*  --   --  1.88*  --  1.76*  HEPARINUNFRC  --   --  0.36  --  0.33  --   CREATININE 4.25*  --   --  6.19*  --   --   TROPONINI  --  <0.30  --  <0.30 <0.30  --     Estimated Creatinine Clearance: 9.6 mL/min (by C-G formula based on Cr of 6.19).   Assessment: 58 yo female with mechanical heart valve now s/p cath to resume warfarin and heparin 8 hours post sheath removal. Sheath removed at 1500 this afternoon. She was previously therapeutic on 850 units/hr.  Patient's home dose of warfarin is 3mg  daily except 4mg  on Mondays. INR this morning 1.76.  Hgb 11.4, plts 138. No overt bleeding noted.  Goal of Therapy:  INR 2.5-3 Heparin level 0.3-0.7 units/ml Monitor platelets by anticoagulation protocol: Yes   Plan:  1. Resume heparin at 850 units/hr at 2300 tonight 2. Warfarin 4mg  po x1 tonight  3. Daily HL, CBC and INR- next heparin level with AM labs 4. Follow for s/s bleeding  Shamiya Demeritt D. Neven Fina, PharmD, BCPS Clinical Pharmacist Pager: [phone] 12/16/2013 3:26 PM  ADDENDUM Notified about bleeding from groin site  and decision to change restart of heparin to 12h post sheath removal.   Plan: -start heparin infusion at 850 units/hr at 0300 tomorrow morning -first heparin level at 0900 -daily HL, CBC and INR  Shay Jhaveri D. Martie Fulgham, PharmD, BCPS Clinical Pharmacist Pager: [phone] 12/16/2013 5:52 PM

## 2013-12-16 NOTE — H&P (View-Only) (Signed)
Patient Profile: 58 y/o AAF with h/o single vessel CABG (LIMA -LAD) and mechanical AVR for severe AS 03/15/2013, ESRD on HD and h/o liver transplant at [facility] in 2011, admitted for unstable angina.    Subjective: Currently CP free on IV heparin and IV NTG  Objective: Vital signs in last 24 hours: Temp:  [97.4 F (36.3 C)-98.5 F (36.9 C)] 97.6 F (36.4 C) (11/06 0847) Pulse Rate:  [62-77] 62 (11/06 0847) Resp:  [16-22] 18 (11/06 0847) BP: (83-165)/(29-73) 165/73 mmHg (11/06 0847) SpO2:  [93 %-100 %] 100 % (11/06 0847) Weight:  [160 lb (72.576 kg)-163 lb 2.3 oz (74 kg)] 163 lb 2.3 oz (74 kg) (11/05 2345) Last BM Date: 12/14/13  Intake/Output from previous day: 11/05 0701 - 11/06 0700 In: 405 [P.O.:240; I.V.:165] Out: -  Intake/Output this shift:    Medications Current Facility-Administered Medications  Medication Dose Route Frequency Provider Last Rate Last Dose  . 0.9 %  sodium chloride infusion  250 mL Intravenous PRN Samin Milke Erie Noe, PA-C      . 0.9 %  sodium chloride infusion   Intravenous Continuous Consuelo Pandy, PA-C 10 mL/hr at 12/16/13 0635 10 mL/hr at 12/16/13 0635  . 0.9 %  sodium chloride infusion  250 mL Intravenous PRN Merton Border, MD      . acetaminophen (TYLENOL) tablet 650 mg  650 mg Oral Q6H PRN Merton Border, MD       Or  . acetaminophen (TYLENOL) suppository 650 mg  650 mg Rectal Q6H PRN Merton Border, MD      . albuterol (PROVENTIL) (2.5 MG/3ML) 0.083% nebulizer solution 2.5 mg  2.5 mg Nebulization Q2H PRN Merton Border, MD      . amLODipine (NORVASC) tablet 10 mg  10 mg Oral Daily Merton Border, MD      . Derrill Memo ON 12/17/2013] aspirin EC tablet 81 mg  81 mg Oral Daily Merton Border, MD      . calcium carbonate (TUMS - dosed in mg elemental calcium) chewable tablet 200 mg of elemental calcium  1 tablet Oral Daily Merton Border, MD      . cinacalcet (SENSIPAR) tablet 30 mg  30 mg Oral QPC breakfast Merton Border, MD      . DULoxetine (CYMBALTA) DR capsule 60 mg  60 mg Oral  QHS Merton Border, MD   60 mg at 12/15/13 2255  . gabapentin (NEURONTIN) capsule 300 mg  300 mg Oral QHS Merton Border, MD   300 mg at 12/15/13 2119  . heparin ADULT infusion 100 units/mL (25000 units/250 mL)  850 Units/hr Intravenous Continuous Cassie L Stewart, RPH 8.5 mL/hr at 12/16/13 0700 850 Units/hr at 12/16/13 0700  . hydrALAZINE (APRESOLINE) tablet 25 mg  25 mg Oral TID Merton Border, MD   25 mg at 12/15/13 2256  . HYDROmorphone (DILAUDID) injection 1 mg  1 mg Intravenous Q4H PRN Merton Border, MD   1 mg at 12/16/13 0423  . insulin aspart (novoLOG) injection 0-15 Units  0-15 Units Subcutaneous TID WC Merton Border, MD   5 Units at 12/16/13 0745  . insulin aspart (novoLOG) injection 0-5 Units  0-5 Units Subcutaneous QHS Merton Border, MD   0 Units at 12/15/13 2055  . levothyroxine (SYNTHROID, LEVOTHROID) tablet 150 mcg  150 mcg Oral QAC breakfast Merton Border, MD   150 mcg at 12/16/13 [phone]  . LORazepam (ATIVAN) tablet 0.5 mg  0.5 mg Oral QHS PRN Merton Border, MD      . metoprolol (LOPRESSOR) tablet 50  mg  50 mg Oral BID Merton Border, MD   50 mg at 12/15/13 2256  . multivitamin with minerals tablet 1 tablet  1 tablet Oral QHS Merton Border, MD   1 tablet at 12/15/13 2119  . nitroGLYCERIN 50 mg in dextrose 5 % 250 mL (0.2 mg/mL) infusion  0-200 mcg/min Intravenous Titrated Merton Border, MD 3 mL/hr at 12/16/13 0700 10 mcg/min at 12/16/13 0700  . ondansetron (ZOFRAN) tablet 4 mg  4 mg Oral Q6H PRN Merton Border, MD       Or  . ondansetron [facility]) injection 4 mg  4 mg Intravenous Q6H PRN Merton Border, MD   4 mg at 12/16/13 0606  . promethazine (PHENERGAN) tablet 12.5 mg  12.5 mg Oral Q4H PRN Merton Border, MD      . sodium chloride 0.9 % injection 3 mL  3 mL Intravenous Q12H Seletha Zimmermann Erie Noe, PA-C   3 mL at 12/15/13 2057  . sodium chloride 0.9 % injection 3 mL  3 mL Intravenous PRN Briggett Tuccillo M Maiah Sinning, PA-C      . sodium chloride 0.9 % injection 3 mL  3 mL Intravenous Q12H Merton Border, MD   3 mL at 12/15/13 2057  . sodium  chloride 0.9 % injection 3 mL  3 mL Intravenous Q12H Merton Border, MD   3 mL at 12/15/13 2056  . sodium chloride 0.9 % injection 3 mL  3 mL Intravenous PRN Merton Border, MD      . sucroferric oxyhydroxide [facility]) chewable tablet 500 mg  500 mg Oral TID WC Merton Border, MD   500 mg at 12/16/13 0746  . tacrolimus (PROGRAF) capsule 3 mg  3 mg Oral BID Merton Border, MD   3 mg at 12/15/13 2118  . traMADol (ULTRAM) tablet 50 mg  50 mg Oral Q12H PRN Merton Border, MD   50 mg at 12/15/13 2123  . zolpidem (AMBIEN) tablet 5 mg  5 mg Oral QHS PRN Merton Border, MD        PE: General appearance: alert, cooperative and no distress Lungs: clear to auscultation bilaterally Heart: regular rate and rhythm and crisp audible mechanical valve sounds Extremities: no LEE Pulses: 2+ and symmetric Skin: warm and dry Neurologic: Grossly normal  Lab Results:   Recent Labs  12/15/13 1342 12/16/13 0356  WBC 5.3 5.1  HGB 12.6 11.4*  HCT 38.6 36.6  PLT 140* 138*   BMET  Recent Labs  12/15/13 1342 12/16/13 0356  NA 140 142  K 5.3 4.8  CL 94* 97  CO2 28 28  GLUCOSE 236* 173*  BUN 16 29*  CREATININE 4.25* 6.19*  CALCIUM 8.2* 7.9*   PT/INR  Recent Labs  12/15/13 1342 12/16/13 0356  LABPROT 19.7* 21.8*  INR 1.65* 1.88*     Assessment/Plan    Principal Problem:   Unstable angina Active Problems:   End stage renal disease on dialysis-TTS   CAD- s/p LIMA-LAD Feb. 2015   Aortic stenosis, moderate-severe at cath Jan 2015. s/p Mechanical AVR 03/2013   Dyslipidemia-LDL 104, not on statin with Hx of liver transplant   Chest pain   Tobacco abuse   S/P mechanical AVR Feb 2015   Chronic anticoagulation w/ coumadin, goal INR 2.5-3.0 w/ AVR   S/P CABG x 1, LIMA-LAD 03/2013   DM type 2 (diabetes mellitus, type 2)  1. Unstable Angina/CAD: Currently CP free on IV heparin and IV NTG. Plan for LHC to redefine coronary anatomy and LIMA patency. Will defer cath  until INR is <1.7. 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   2. H/O Mechanical AVR: on IV heparin for subtherapeutic INR. Continue coumadin with heparin bridge post cath. INR goal 2.5-3.5.  3. ESRD: HD per nephrology  4. DM: per TRH    LOS: 1 day    Averianna Brugger M. Ladoris Gene 12/16/2013 9:38 AM  Patient seen and examined. Agree with assessment and plan. Pt well known to me. She developed significant hypotension yesterday during dialysis with subsequent development of chest pain later that was nitrate responsive. Agree with plans for repeat cath. INR earlier today has increased to 1.88; will recheck and probably cath later today.   Troy Sine, MD, [facility] 12/16/2013 10:20 AM

## 2013-12-16 NOTE — Interval H&P Note (Signed)
Cath Lab Visit (complete for each Cath Lab visit)  Clinical Evaluation Leading to the Procedure:   ACS: No.  Non-ACS:    Anginal Classification: CCS IV  Anti-ischemic medical therapy: Maximal Therapy (2 or more classes of medications)  Non-Invasive Test Results: No non-invasive testing performed  Prior CABG: Previous CABG      History and Physical Interval Note:  12/16/2013 2:27 PM  [name]  has presented today for surgery, with the diagnosis of cp  The various methods of treatment have been discussed with the patient and family. After consideration of risks, benefits and other options for treatment, the patient has consented to  Procedure(s): LEFT HEART CATHETERIZATION WITH CORONARY/GRAFT ANGIOGRAM (N/A) as a surgical intervention .  The patient's history has been reviewed, patient examined, no change in status, stable for surgery.  I have reviewed the patient's chart and labs.  Questions were answered to the patient's satisfaction.     KELLY,THOMAS A

## 2013-12-17 DIAGNOSIS — Z7901 Long term (current) use of anticoagulants: Secondary | ICD-10-CM

## 2013-12-17 DIAGNOSIS — Z954 Presence of other heart-valve replacement: Secondary | ICD-10-CM

## 2013-12-17 DIAGNOSIS — R072 Precordial pain: Secondary | ICD-10-CM

## 2013-12-17 DIAGNOSIS — N186 End stage renal disease: Secondary | ICD-10-CM

## 2013-12-17 DIAGNOSIS — Z992 Dependence on renal dialysis: Secondary | ICD-10-CM

## 2013-12-17 LAB — COMPREHENSIVE METABOLIC PANEL
ALT: 33 U/L (ref 0–35)
AST: 46 U/L — ABNORMAL HIGH (ref 0–37)
Albumin: 2.8 g/dL — ABNORMAL LOW (ref 3.5–5.2)
Alkaline Phosphatase: 91 U/L (ref 39–117)
Anion gap: 16 — ABNORMAL HIGH (ref 5–15)
BUN: 29 mg/dL — ABNORMAL HIGH (ref 6–23)
CO2: 26 mEq/L (ref 19–32)
Calcium: 8.2 mg/dL — ABNORMAL LOW (ref 8.4–10.5)
Chloride: 98 mEq/L (ref 96–112)
Creatinine, Ser: 5.1 mg/dL — ABNORMAL HIGH (ref 0.50–1.10)
GFR calc Af Amer: 10 mL/min — ABNORMAL LOW (ref >90)
GFR calc non Af Amer: 8 mL/min — ABNORMAL LOW (ref >90)
Glucose, Bld: 85 mg/dL (ref 70–99)
Potassium: 3.8 mEq/L (ref 3.7–5.3)
Sodium: 140 mEq/L (ref 137–147)
Total Bilirubin: 0.3 mg/dL (ref 0.3–1.2)
Total Protein: 6.6 g/dL (ref 6.0–8.3)

## 2013-12-17 LAB — CBC
HEMATOCRIT: 35.5 % — AB (ref 36.0–46.0)
HEMOGLOBIN: 11.6 g/dL — AB (ref 12.0–15.0)
MCH: 33.6 pg (ref 26.0–34.0)
MCHC: 32.7 g/dL (ref 30.0–36.0)
MCV: 102.9 fL — AB (ref 78.0–100.0)
Platelets: 138 10*3/uL — ABNORMAL LOW (ref 150–400)
RBC: 3.45 MIL/uL — AB (ref 3.87–5.11)
RDW: 16.8 % — ABNORMAL HIGH (ref 11.5–15.5)
WBC: 5 10*3/uL (ref 4.0–10.5)

## 2013-12-17 LAB — BASIC METABOLIC PANEL
Anion gap: 20 — ABNORMAL HIGH (ref 5–15)
BUN: 49 mg/dL — AB (ref 6–23)
CALCIUM: 7.9 mg/dL — AB (ref 8.4–10.5)
CO2: 25 mEq/L (ref 19–32)
Chloride: 92 mEq/L — ABNORMAL LOW (ref 96–112)
Creatinine, Ser: 8.45 mg/dL — ABNORMAL HIGH (ref 0.50–1.10)
GFR calc Af Amer: 5 mL/min — ABNORMAL LOW (ref >90)
GFR, EST NON AFRICAN AMERICAN: 5 mL/min — AB (ref >90)
GLUCOSE: 154 mg/dL — AB (ref 70–99)
Potassium: 5.2 mEq/L (ref 3.7–5.3)
Sodium: 137 mEq/L (ref 137–147)

## 2013-12-17 LAB — PROTIME-INR
INR: 1.88 — ABNORMAL HIGH (ref 0.00–1.49)
Prothrombin Time: 21.8 seconds — ABNORMAL HIGH (ref 11.6–15.2)

## 2013-12-17 LAB — GLUCOSE, CAPILLARY
GLUCOSE-CAPILLARY: 157 mg/dL — AB (ref 70–99)
Glucose-Capillary: 208 mg/dL — ABNORMAL HIGH (ref 70–99)
Glucose-Capillary: 110 mg/dL — ABNORMAL HIGH (ref 70–99)
Glucose-Capillary: 129 mg/dL — ABNORMAL HIGH (ref 70–99)

## 2013-12-17 LAB — HEPARIN LEVEL (UNFRACTIONATED)
HEPARIN UNFRACTIONATED: 0.21 [IU]/mL — AB (ref 0.30–0.70)
HEPARIN UNFRACTIONATED: 0.45 [IU]/mL (ref 0.30–0.70)

## 2013-12-17 MED ORDER — WARFARIN SODIUM 4 MG PO TABS
4.0000 mg | ORAL_TABLET | Freq: Once | ORAL | Status: AC
  Administered 2013-12-17: 4 mg via ORAL
  Filled 2013-12-17: qty 1

## 2013-12-17 NOTE — Plan of Care (Signed)
Problem: Phase III Progression Outcomes Goal: Hemodynamically stable Outcome: Completed/Met Date Met:  12/17/13 Goal: No anginal pain Outcome: Completed/Met Date Met:  12/17/13 Goal: Cath/PCI Path as indicated Outcome: Not Applicable Date Met:  15/05/69 Goal: Vascular site scale level 0 - I Vascular Site Scale Level 0: No bruising/bleeding/hematoma Level I (Mild): Bruising/Ecchymosis, minimal bleeding/ooozing, palpable hematoma < 3 cm Level II (Moderate): Bleeding not affecting hemodynamic parameters, pseudoaneurysm, palpable hematoma > 3 cm Level III (Severe) Bleeding which affects hemodynamic parameters or retroperitoneal hemorrhage  Outcome: Not Applicable Date Met:  79/48/01 Goal: Tolerating diet Outcome: Completed/Met Date Met:  12/17/13 Goal: If positive for MI, change to MI Path Outcome: Not Applicable Date Met:  65/53/74

## 2013-12-17 NOTE — Progress Notes (Signed)
ANTICOAGULATION CONSULT NOTE - Follow Up Consult  Pharmacy Consult for coumadin and heparin Indication: mechanical heart valve  Allergies  Allergen Reactions  . Acetaminophen Other (See Comments)    Liver transplant recipient   . Codeine Itching  . Mirtazapine Other (See Comments)    hallucination  . Morphine Itching    Patient Measurements: Height: 5\' 4"  (162.6 cm) Weight: 166 lb 14.2 oz (75.7 kg) IBW/kg (Calculated) : 54.7 Heparin Dosing Weight: 70 kg  Vital Signs: Temp: 97.7 F (36.5 C) (11/07 0750) Temp Source: Oral (11/07 0750) BP: 131/48 mmHg (11/07 1030) Pulse Rate: 63 (11/07 1030)  Labs:  Recent Labs  12/15/13 1342 12/15/13 2120 12/16/13 0035 12/16/13 0356 12/16/13 0908 12/16/13 1239 12/17/13 0430 12/17/13 0830  HGB 12.6  --   --  11.4*  --   --  11.6*  --   HCT 38.6  --   --  36.6  --   --  35.5*  --   PLT 140*  --   --  138*  --   --  138*  --   LABPROT 19.7*  --   --  21.8*  --  20.7* 21.8*  --   INR 1.65*  --   --  1.88*  --  1.76* 1.88*  --   HEPARINUNFRC  --   --  0.36  --  0.33  --   --  0.21*  CREATININE 4.25*  --   --  6.19*  --   --  8.45* 5.10*  TROPONINI  --  <0.30  --  <0.30 <0.30  --   --   --     Estimated Creatinine Clearance: 11.8 mL/min (by C-G formula based on Cr of 5.1).   Medications:  Scheduled:  . amLODipine  10 mg Oral Daily  . aspirin EC  81 mg Oral Daily  . calcitRIOL  0.5 mcg Oral Q T,Th,Sa-HD  . calcium carbonate  1 tablet Oral Daily  . cinacalcet  30 mg Oral QPC breakfast  . DULoxetine  60 mg Oral QHS  . gabapentin  300 mg Oral QHS  . hydrALAZINE  25 mg Oral TID  . insulin aspart  0-15 Units Subcutaneous TID WC  . insulin aspart  0-5 Units Subcutaneous QHS  . levothyroxine  150 mcg Oral QAC breakfast  . metoprolol  50 mg Oral BID  . multivitamin with minerals  1 tablet Oral QHS  . polyethylene glycol  17 g Oral Daily  . tacrolimus  3 mg Oral BID  . warfarin  4 mg Oral ONCE-1800  . Warfarin - Pharmacist Dosing  Inpatient   Does not apply q1800   Infusions:  . sodium chloride 25 mL/hr at 12/16/13 1748  . heparin 850 Units/hr (12/17/13 0313)    Assessment: 58 yo female with mechanical heart valve is s/p cath is currently on subtherapeutic heparin and coumadin.  INR today is 1.88 (dose was missed last night) and heparin level is 0.21.  Goal of Therapy:  Heparin level 0.3-0.7 units/ml; INR 2.5-3 Monitor platelets by anticoagulation protocol: Yes   Plan:  - Coumadin 4 mg po x1; inc hep to 1000 units/hr - 8 hr heparin level   [name], [name] 12/17/2013,11:01 AM

## 2013-12-17 NOTE — Progress Notes (Signed)
ANTICOAGULATION CONSULT NOTE - Follow Up Consult  Pharmacy Consult for heparin Indication: mechanical heart valve  Allergies  Allergen Reactions  . Acetaminophen Other (See Comments)    Liver transplant recipient   . Codeine Itching  . Mirtazapine Other (See Comments)    hallucination  . Morphine Itching    Patient Measurements: Height: 5\' 4"  (162.6 cm) Weight: 162 lb 7.7 oz (73.7 kg) IBW/kg (Calculated) : 54.7 Heparin Dosing Weight: 70 kg  Vital Signs: Temp: 98.4 F (36.9 C) (11/07 1345) Temp Source: Oral (11/07 1345) BP: 142/52 mmHg (11/07 1345) Pulse Rate: 71 (11/07 1345)  Labs:  Recent Labs  12/15/13 1342 12/15/13 2120  12/16/13 0356 12/16/13 0908 12/16/13 1239 12/17/13 0430 12/17/13 0830 12/17/13 1900  HGB 12.6  --   --  11.4*  --   --  11.6*  --   --   HCT 38.6  --   --  36.6  --   --  35.5*  --   --   PLT 140*  --   --  138*  --   --  138*  --   --   LABPROT 19.7*  --   --  21.8*  --  20.7* 21.8*  --   --   INR 1.65*  --   --  1.88*  --  1.76* 1.88*  --   --   HEPARINUNFRC  --   --   < >  --  0.33  --   --  0.21* 0.45  CREATININE 4.25*  --   --  6.19*  --   --  8.45* 5.10*  --   TROPONINI  --  <0.30  --  <0.30 <0.30  --   --   --   --   < > = values in this interval not displayed.  Estimated Creatinine Clearance: 11.7 mL/min (by C-G formula based on Cr of 5.1).   Medications:  Scheduled:  . amLODipine  10 mg Oral Daily  . aspirin EC  81 mg Oral Daily  . calcitRIOL  0.5 mcg Oral Q T,Th,Sa-HD  . calcium carbonate  1 tablet Oral Daily  . cinacalcet  30 mg Oral QPC breakfast  . DULoxetine  60 mg Oral QHS  . gabapentin  300 mg Oral QHS  . hydrALAZINE  25 mg Oral TID  . insulin aspart  0-15 Units Subcutaneous TID WC  . insulin aspart  0-5 Units Subcutaneous QHS  . levothyroxine  150 mcg Oral QAC breakfast  . metoprolol  50 mg Oral BID  . multivitamin with minerals  1 tablet Oral QHS  . polyethylene glycol  17 g Oral Daily  . tacrolimus  3 mg Oral  BID  . Warfarin - Pharmacist Dosing Inpatient   Does not apply q1800   Infusions:  . sodium chloride 25 mL/hr at 12/16/13 1748  . heparin 1,000 Units/hr (12/17/13 1104)    Assessment: 58 yo female with mechanical heart valve is s/p cath and is currently on heparin bridging to coumadin.  INR today is 1.88 (dose was missed last night) and heparin level is now therapeutic after rate adjustment.  Goal of Therapy:  Heparin level 0.3-0.7 units/ml; INR 2.5-3 Monitor platelets by anticoagulation protocol: Yes   Plan:  Continue Heparin at 1000 units/hr Daily heparin level and CBC  Legrand Como, Pharm.D., BCPS, AAHIVP Clinical Pharmacist Phone: [phone] or [phone] 12/17/2013, 7:52 PM

## 2013-12-17 NOTE — Progress Notes (Signed)
Patient ID: [name]  female  FHQ:[id number]    DOB: 03-12-1955    DOA: 12/15/2013  PCP: Charlynn Court, NP  Brief history of present illness  The patient is a 58 year old female with past medical history for CAD s/p LIMA to LAD, aortic stenosis s/p AVR in 2015, hepatitis C s/p liver transplant in 2011, [place], ESRD on HD, presented with chest pain episodes occurring during hemodialysis. EKG showed new T-wave inversions in the inferior leads. Cardiology was consulted and patient was started on heparin and nitroglycerin drip. Plan for cardiac cath on 11/6.d-dimer negative   Assessment/Plan: Principal Problem:   Unstable angina with history of CAD- s/p LIMA-LAD Feb. 2015,  Aortic stenosis, moderate-severe at cath Jan 2015. s/p Mechanical AVR 03/2013 - cardiac cath done, 70% mid LAD stenosis with patent LIMA to LAD otherwise nonobstructive disease - Per cardiology, plan for medical therapy  Active Problems:   End stage renal disease on dialysis-TTS - renal consulted, hemodialysis today    Dyslipidemia-LDL 104, not on statin with Hx of liver transplant    Chronic anticoagulation w/ coumadin, goal INR 2.5-3.5 w/ AVR - currently on heparin drip for bridging with Coumadin - Coumadin was held for cardiac cath    DM type 2 (diabetes mellitus, type 2) - continue sliding scale insulin, currently nothing by mouth, will follow CBGs   DVT Prophylaxis:heparin drip + Coumadin  Code Status:full code  Family Communication:  Disposition:  Consultants:  cardiology  Procedures:  Cardiac catheterization  Antibiotics:  none    Subjective: Patient seen and examined earlier this morning, no complaints, feeling better, no chest pain  Objective: Weight change: 3.325 kg (7 lb 5.3 oz)  Intake/Output Summary (Last 24 hours) at 12/17/13 1005 Last data filed at 12/16/13 1400  Gross per 24 hour  Intake  109.5 ml  Output      0 ml  Net  109.5 ml   Blood pressure 102/43, pulse  67, temperature 97.7 F (36.5 C), temperature source Oral, resp. rate 16, height 5\' 4"  (1.626 m), weight 75.7 kg (166 lb 14.2 oz), SpO2 100 %.  Physical Exam: General: Alert and awake, oriented x3, not in any acute distress. CVS: S1-S2 clear, no murmur rubs or gallops, +mechanical valve sounds Chest: clear to auscultation bilaterally, no wheezing, rales or rhonchi Abdomen: soft nontender, nondistended, normal bowel sounds  Extremities: no cyanosis, clubbing or edema noted bilaterally   Lab Results: Basic Metabolic Panel:  Recent Labs Lab 12/17/13 0430 12/17/13 0830  NA 137 140  K 5.2 3.8  CL 92* 98  CO2 25 26  GLUCOSE 154* 85  BUN 49* 29*  CREATININE 8.45* 5.10*  CALCIUM 7.9* 8.2*   Liver Function Tests:  Recent Labs Lab 12/15/13 1342 12/17/13 0830  AST 76* 46*  ALT 39* 33  ALKPHOS 95 91  BILITOT 0.4 0.3  PROT 7.9 6.6  ALBUMIN 3.1* 2.8*   No results for input(s): LIPASE, AMYLASE in the last 168 hours. No results for input(s): AMMONIA in the last 168 hours. CBC:  Recent Labs Lab 12/16/13 0356 12/17/13 0430  WBC 5.1 5.0  HGB 11.4* 11.6*  HCT 36.6 35.5*  MCV 104.3* 102.9*  PLT 138* 138*   Cardiac Enzymes:  Recent Labs Lab 12/15/13 2120 12/16/13 0356 12/16/13 0908  TROPONINI <0.30 <0.30 <0.30   BNP: Invalid input(s): POCBNP CBG:  Recent Labs Lab 12/16/13 1231 12/16/13 1541 12/16/13 1640 12/16/13 2044 12/17/13 0616  GLUCAP 102* 124* 107* 189* 208*  Micro Results: Recent Results (from the past 240 hour(s))  MRSA PCR Screening     Status: None   Collection Time: 12/15/13  7:47 PM  Result Value Ref Range Status   MRSA by PCR NEGATIVE NEGATIVE Final    Comment:        The GeneXpert MRSA Assay (FDA approved for NASAL specimens only), is one component of a comprehensive MRSA colonization surveillance program. It is not intended to diagnose MRSA infection nor to guide or monitor treatment for MRSA infections.      Studies/Results: Dg Chest Portable 1 View  12/15/2013   EXAM: PORTABLE CHEST - 1 VIEW  COMPARISON:  None.  FINDINGS: The heart size and mediastinal contours are within normal limits. Both lungs are clear. The visualized skeletal structures are unremarkable.  IMPRESSION: No active disease.   Electronically Signed   By: Franchot Gallo M.D.   On: 12/15/2013 14:27    Medications: Scheduled Meds: . amLODipine  10 mg Oral Daily  . aspirin EC  81 mg Oral Daily  . calcitRIOL  0.5 mcg Oral Q T,Th,Sa-HD  . calcium carbonate  1 tablet Oral Daily  . cinacalcet  30 mg Oral QPC breakfast  . DULoxetine  60 mg Oral QHS  . gabapentin  300 mg Oral QHS  . hydrALAZINE  25 mg Oral TID  . insulin aspart  0-15 Units Subcutaneous TID WC  . insulin aspart  0-5 Units Subcutaneous QHS  . levothyroxine  150 mcg Oral QAC breakfast  . metoprolol  50 mg Oral BID  . multivitamin with minerals  1 tablet Oral QHS  . polyethylene glycol  17 g Oral Daily  . tacrolimus  3 mg Oral BID  . warfarin  4 mg Oral ONCE-1800  . Warfarin - Pharmacist Dosing Inpatient   Does not apply q1800      LOS: 2 days   RAI,RIPUDEEP M.D. [facility] 12/17/2013, 10:05 AM Pager: [phone]  If 7PM-7AM, please contact night-coverage [email or web address] Password [email or web address]

## 2013-12-17 NOTE — Progress Notes (Signed)
[facility] Progress Note  Assessment/Plan: 1. Chest pain - hx of CABG/AVR 03/2013 - volume high on HD lately with high IDWG - has gained some EDW, troponin's neg, EKG showed new T wave inversions in the inf leads; heart cath yesterday showed 70% LAD patent LIMA graft - plan medical tx 2. ESRD - TTS - K 5.2- new outpt EDW 73.5 - needs orthostatics pre and post HD 3. Hypertension/volume - vol ok - CXR neg on admission; BP higher than usual; norvasc 10 hydralazine 25 bid, meotprolol 25 bid 4. Anemia - Hgb 11.6 - hold Aranesp as long as Hgb > 11 5. Metabolic bone disease - Recent changed from 2 Hectorol to 0.25 of calcitriol - iPTH up to 403 with Ca on the low side - will increase calcitriol to 0.5 TIW- stop velphoro (not taking); continue tums and sensipar 6. Nutrition - renal + vitmain 7. Chronic coumadin -  Per pharm 8. Chronic constipation - resumed miralax 9. Nausea- resolved 10. ^ LFTs - repeat today- add onto previous lab draw  Myriam Jacobson, PA-C [place] [phone] 12/17/2013,7:58 AM  LOS: 2 days   Pt seen, examined and agree w A/P as above.  [name] MD pager [phone]    cell [phone] 12/17/2013, 9:35 AM    Subjective:     Objective Filed Vitals:   12/16/13 1700 12/16/13 1757 12/16/13 1857 12/17/13 0530  BP: 161/59 166/55 154/58 131/45  Pulse: 60  82 67  Temp:    98.1 F (36.7 C)  TempSrc:    Oral  Resp:    17  Height:      Weight:    75.9 kg (167 lb 5.3 oz)  SpO2:    100%   Physical Exam General: Heart: Lungs: Abdomen: Extremities: Dialysis Access: left thigh AVGG  CXR clear  Dialysis Orders: TTS [name] 3.75 hr 180 400/A 1.5 2 K 2.25 Ca EDW 73.5 Left thigh AVGG NO heparin  Aranesp 40 last given 11/3 no Fe calcitriol 0.25 (prev on 2 Hectorol) Recent labs: Hgb 11 10/29 with 48% sat ferritin 1690 iPTH 403 up from 100s when on 2 of Hectorol; Ca/P ok  Additional Objective Labs: Basic Metabolic  Panel:  Recent Labs Lab 12/15/13 1342 12/16/13 0356 12/17/13 0430  NA 140 142 137  K 5.3 4.8 5.2  CL 94* 97 92*  CO2 28 28 25   GLUCOSE 236* 173* 154*  BUN 16 29* 49*  CREATININE 4.25* 6.19* 8.45*  CALCIUM 8.2* 7.9* 7.9*   Liver Function Tests:  Recent Labs Lab 12/15/13 1342  AST 76*  ALT 39*  ALKPHOS 95  BILITOT 0.4  PROT 7.9  ALBUMIN 3.1*   CBC:  Recent Labs Lab 12/15/13 1342 12/16/13 0356 12/17/13 0430  WBC 5.3 5.1 5.0  HGB 12.6 11.4* 11.6*  HCT 38.6 36.6 35.5*  MCV 101.6* 104.3* 102.9*  PLT 140* 138* 138*  Cardiac Enzymes:  Recent Labs Lab 12/15/13 2120 12/16/13 0356 12/16/13 0908  TROPONINI <0.30 <0.30 <0.30   CBG:  Recent Labs Lab 12/16/13 1231 12/16/13 1541 12/16/13 1640 12/16/13 2044 12/17/13 0616  GLUCAP 102* 124* 107* 189* 208*   Lab Results  Component Value Date   INR 1.88* 12/17/2013   INR 1.76* 12/16/2013   INR 1.88* 12/16/2013   Studies/Results: Dg Chest Portable 1 View  12/15/2013   EXAM: PORTABLE CHEST - 1 VIEW  COMPARISON:  None.  FINDINGS: The heart size and mediastinal contours are within normal limits. Both lungs are  clear. The visualized skeletal structures are unremarkable.  IMPRESSION: No active disease.   Electronically Signed   By: Franchot Gallo M.D.   On: 12/15/2013 14:27   Medications: . sodium chloride 25 mL/hr at 12/16/13 1748  . heparin 850 Units/hr (12/17/13 0313)   . amLODipine  10 mg Oral Daily  . aspirin EC  81 mg Oral Daily  . calcitRIOL  0.5 mcg Oral Q T,Th,Sa-HD  . calcium carbonate  1 tablet Oral Daily  . cinacalcet  30 mg Oral QPC breakfast  . DULoxetine  60 mg Oral QHS  . gabapentin  300 mg Oral QHS  . hydrALAZINE  25 mg Oral TID  . insulin aspart  0-15 Units Subcutaneous TID WC  . insulin aspart  0-5 Units Subcutaneous QHS  . levothyroxine  150 mcg Oral QAC breakfast  . metoprolol  50 mg Oral BID  . multivitamin with minerals  1 tablet Oral QHS  . polyethylene glycol  17 g Oral Daily  .  tacrolimus  3 mg Oral BID  . warfarin  4 mg Oral ONCE-1800  . Warfarin - Pharmacist Dosing Inpatient   Does not apply [phone]

## 2013-12-17 NOTE — Procedures (Signed)
I was present at this dialysis session, have reviewed the session itself and made  appropriate changes  [name] MD (pgr) [phone]    [phone] 12/17/2013, 9:37 AM

## 2013-12-17 NOTE — Progress Notes (Signed)
Primary cardiologist: Dr. Quay Burow  Seen for followup: Chest pain, cardiac catheterization  Subjective:    Currently in hemodialysis, comfortable, no chest pain.  Objective:   Temp:  [97.7 F (36.5 C)-98.1 F (36.7 C)] 97.7 F (36.5 C) (11/07 0750) Pulse Rate:  [52-82] 62 (11/07 0815) Resp:  [10-18] 16 (11/07 0810) BP: (119-192)/(35-64) 153/49 mmHg (11/07 0815) SpO2:  [100 %] 100 % (11/07 0750) Weight:  [166 lb 14.2 oz (75.7 kg)-167 lb 5.3 oz (75.9 kg)] 166 lb 14.2 oz (75.7 kg) (11/07 0750) Last BM Date: 12/15/13  Filed Weights   12/15/13 2345 12/17/13 0530 12/17/13 0750  Weight: 163 lb 2.3 oz (74 kg) 167 lb 5.3 oz (75.9 kg) 166 lb 14.2 oz (75.7 kg)    Intake/Output Summary (Last 24 hours) at 12/17/13 0912 Last data filed at 12/16/13 1400  Gross per 24 hour  Intake  109.5 ml  Output      0 ml  Net  109.5 ml    Exam:  General: No distress.  Lungs: Clear, nonlabored.  Cardiac: RRR, crisp prosthetic valve click and S2.  Extremities: No edema.   Lab Results:  Basic Metabolic Panel:  Recent Labs Lab 12/15/13 1342 12/16/13 0356 12/17/13 0430  NA 140 142 137  K 5.3 4.8 5.2  CL 94* 97 92*  CO2 28 28 25   GLUCOSE 236* 173* 154*  BUN 16 29* 49*  CREATININE 4.25* 6.19* 8.45*  CALCIUM 8.2* 7.9* 7.9*    Liver Function Tests:  Recent Labs Lab 12/15/13 1342  AST 76*  ALT 39*  ALKPHOS 95  BILITOT 0.4  PROT 7.9  ALBUMIN 3.1*    CBC:  Recent Labs Lab 12/15/13 1342 12/16/13 0356 12/17/13 0430  WBC 5.3 5.1 5.0  HGB 12.6 11.4* 11.6*  HCT 38.6 36.6 35.5*  MCV 101.6* 104.3* 102.9*  PLT 140* 138* 138*    Cardiac Enzymes:  Recent Labs Lab 12/15/13 2120 12/16/13 0356 12/16/13 0908  TROPONINI <0.30 <0.30 <0.30    Coagulation:  Recent Labs Lab 12/16/13 0356 12/16/13 1239 12/17/13 0430  INR 1.88* 1.76* 1.88*     Medications:   Scheduled Medications: . amLODipine  10 mg Oral Daily  . aspirin EC  81 mg Oral Daily  .  calcitRIOL  0.5 mcg Oral Q T,Th,Sa-HD  . calcium carbonate  1 tablet Oral Daily  . cinacalcet  30 mg Oral QPC breakfast  . DULoxetine  60 mg Oral QHS  . gabapentin  300 mg Oral QHS  . hydrALAZINE  25 mg Oral TID  . insulin aspart  0-15 Units Subcutaneous TID WC  . insulin aspart  0-5 Units Subcutaneous QHS  . levothyroxine  150 mcg Oral QAC breakfast  . metoprolol  50 mg Oral BID  . multivitamin with minerals  1 tablet Oral QHS  . polyethylene glycol  17 g Oral Daily  . tacrolimus  3 mg Oral BID  . warfarin  4 mg Oral ONCE-1800  . Warfarin - Pharmacist Dosing Inpatient   Does not apply q1800     Infusions: . sodium chloride 25 mL/hr at 12/16/13 1748  . heparin 850 Units/hr (12/17/13 0313)     PRN Medications:  sodium chloride, sodium chloride, acetaminophen **OR** acetaminophen, albuterol, feeding supplement (NEPRO CARB STEADY), HYDROmorphone (DILAUDID) injection, lidocaine (PF), lidocaine-prilocaine, LORazepam, ondansetron **OR** ondansetron (ZOFRAN) IV, ondansetron (ZOFRAN) IV, pentafluoroprop-tetrafluoroeth, promethazine, promethazine, traMADol, zolpidem   Assessment:   1. Chest pain syndrome with negative cardiac markers, now status post cardiac catheterization  demonstrating 70% mid LAD with patent LIMA to LAD, otherwise nonobstructive disease. Plan is for medical therapy.  2. History of aortic stenosis now status post mechanical AVR in February. Coumadin was held for heart catheterization, now on heparin bridge. INR 1.8.  3. ESRD on hemodialysis.   Plan/Discussion:    Discussed with patient. Stable cardiac status, would continue medical therapy. Needs heparin bridge with resumption of Coumadin, INR 2.5-3.5.   Satira Sark, M.D., F.A.C.C.

## 2013-12-18 DIAGNOSIS — E785 Hyperlipidemia, unspecified: Secondary | ICD-10-CM

## 2013-12-18 LAB — GLUCOSE, CAPILLARY
GLUCOSE-CAPILLARY: 103 mg/dL — AB (ref 70–99)
GLUCOSE-CAPILLARY: 197 mg/dL — AB (ref 70–99)
GLUCOSE-CAPILLARY: 109 mg/dL — AB (ref 70–99)
Glucose-Capillary: 171 mg/dL — ABNORMAL HIGH (ref 70–99)
Glucose-Capillary: 106 mg/dL — ABNORMAL HIGH (ref 70–99)

## 2013-12-18 LAB — BASIC METABOLIC PANEL
Anion gap: 15 (ref 5–15)
BUN: 33 mg/dL — AB (ref 6–23)
CO2: 25 meq/L (ref 19–32)
CREATININE: 6.4 mg/dL — AB (ref 0.50–1.10)
Calcium: 8.1 mg/dL — ABNORMAL LOW (ref 8.4–10.5)
Chloride: 96 mEq/L (ref 96–112)
GFR calc Af Amer: 7 mL/min — ABNORMAL LOW (ref >90)
GFR calc non Af Amer: 6 mL/min — ABNORMAL LOW (ref >90)
Glucose, Bld: 148 mg/dL — ABNORMAL HIGH (ref 70–99)
Potassium: 5.6 mEq/L — ABNORMAL HIGH (ref 3.7–5.3)
Sodium: 136 mEq/L — ABNORMAL LOW (ref 137–147)

## 2013-12-18 LAB — CBC
HCT: 36.8 % (ref 36.0–46.0)
Hemoglobin: 11.7 g/dL — ABNORMAL LOW (ref 12.0–15.0)
MCH: 33.1 pg (ref 26.0–34.0)
MCHC: 31.8 g/dL (ref 30.0–36.0)
MCV: 104.2 fL — AB (ref 78.0–100.0)
Platelets: 145 10*3/uL — ABNORMAL LOW (ref 150–400)
RBC: 3.53 MIL/uL — AB (ref 3.87–5.11)
RDW: 16.9 % — ABNORMAL HIGH (ref 11.5–15.5)
WBC: 6 10*3/uL (ref 4.0–10.5)

## 2013-12-18 LAB — HEPARIN LEVEL (UNFRACTIONATED): Heparin Unfractionated: 0.51 IU/mL (ref 0.30–0.70)

## 2013-12-18 LAB — PROTIME-INR
INR: 1.57 — ABNORMAL HIGH (ref 0.00–1.49)
Prothrombin Time: 18.9 seconds — ABNORMAL HIGH (ref 11.6–15.2)

## 2013-12-18 MED ORDER — LACTULOSE 10 GM/15ML PO SOLN
10.0000 g | Freq: Every day | ORAL | Status: DC | PRN
  Administered 2013-12-18: 10 g via ORAL
  Filled 2013-12-18 (×2): qty 15

## 2013-12-18 MED ORDER — WARFARIN SODIUM 4 MG PO TABS
4.0000 mg | ORAL_TABLET | Freq: Once | ORAL | Status: AC
  Administered 2013-12-18: 4 mg via ORAL
  Filled 2013-12-18: qty 1

## 2013-12-18 NOTE — Progress Notes (Signed)
[facility] Progress Note  Assessment: 1. Chest pain / hx of 1V CABG/AVR Feb 2015 - LHC on  11/6 showed patent LIMA to LAD, per cards plan medical rx 2. ESRD - TTS,  new outpt EDW 73.5 3. HTN/volume - vol ok, CXR neg, cont norvasc 10 hydralazine 25 bid, meotprolol 25 bid 4. Anemia - Hgb 11.6 - hold Aranesp as long as Hgb > 11 5. Metabolic bone disease - Recent changed from 2 Hectorol to 0.25 of calcitriol - iPTH up to 403 with Ca on the low side - have increased calcitriol to 0.5 TIW- stop velphoro (not taking); continue tums and sensipar 6. Nutrition - renal + vitmain 7. Chronic coumadin -  Per pharm, on IV hep until INR in range for AVR 8. Chronic constipation - resumed miralax 9. Nausea- resolved 10. Liver transplant - 2011, on prograf  Plan - next HD Tuesday if still here  [name] MD pager [phone]    cell [phone] 12/18/2013, 12:29 PM    Subjective:     Objective Filed Vitals:   12/17/13 2030 12/18/13 0450 12/18/13 0523 12/18/13 1022  BP: 123/48 102/27 140/52 116/38  Pulse: 65 64 66 68  Temp: 98.1 F (36.7 C) 98.2 F (36.8 C)    TempSrc: Oral Oral    Resp: 18 18 18 18   Height:      Weight:      SpO2: 100% 98% 100%    Physical Exam General: Heart: Lungs: Abdomen: Extremities: Dialysis Access: left thigh AVGG  CXR clear  Dialysis Orders: TTS [name] 3.75 hr 180 400/A 1.5 2 K 2.25 Ca EDW 73.5 Left thigh AVGG NO heparin  Aranesp 40 last given 11/3 no Fe calcitriol 0.25 (prev on 2 Hectorol) Recent labs: Hgb 11 10/29 with 48% sat ferritin 1690 iPTH 403 up from 100s when on 2 of Hectorol; Ca/P ok  Additional Objective Labs: Basic Metabolic Panel:  Recent Labs Lab 12/17/13 0430 12/17/13 0830 12/18/13 0535  NA 137 140 136*  K 5.2 3.8 5.6*  CL 92* 98 96  CO2 25 26 25   GLUCOSE 154* 85 148*  BUN 49* 29* 33*  CREATININE 8.45* 5.10* 6.40*  CALCIUM 7.9* 8.2* 8.1*   Liver Function Tests:  Recent Labs Lab 12/15/13 1342  12/17/13 0830  AST 76* 46*  ALT 39* 33  ALKPHOS 95 91  BILITOT 0.4 0.3  PROT 7.9 6.6  ALBUMIN 3.1* 2.8*   CBC:  Recent Labs Lab 12/15/13 1342 12/16/13 0356 12/17/13 0430 12/18/13 0535  WBC 5.3 5.1 5.0 6.0  HGB 12.6 11.4* 11.6* 11.7*  HCT 38.6 36.6 35.5* 36.8  MCV 101.6* 104.3* 102.9* 104.2*  PLT 140* 138* 138* 145*  Cardiac Enzymes:  Recent Labs Lab 12/15/13 2120 12/16/13 0356 12/16/13 0908  TROPONINI <0.30 <0.30 <0.30   CBG:  Recent Labs Lab 12/17/13 1637 12/17/13 2127 12/18/13 0607 12/18/13 1024 12/18/13 1136  GLUCAP 129* 157* 171* 103* 106*   Lab Results  Component Value Date   INR 1.57* 12/18/2013   INR 1.88* 12/17/2013   INR 1.76* 12/16/2013   Studies/Results: No results found. Medications: . sodium chloride 25 mL/hr at 12/16/13 1748  . heparin 1,000 Units/hr (12/18/13 0352)   . amLODipine  10 mg Oral Daily  . aspirin EC  81 mg Oral Daily  . calcitRIOL  0.5 mcg Oral Q T,Th,Sa-HD  . calcium carbonate  1 tablet Oral Daily  . cinacalcet  30 mg Oral QPC breakfast  . DULoxetine  60 mg Oral  QHS  . gabapentin  300 mg Oral QHS  . hydrALAZINE  25 mg Oral TID  . insulin aspart  0-15 Units Subcutaneous TID WC  . insulin aspart  0-5 Units Subcutaneous QHS  . levothyroxine  150 mcg Oral QAC breakfast  . metoprolol  50 mg Oral BID  . multivitamin with minerals  1 tablet Oral QHS  . polyethylene glycol  17 g Oral Daily  . tacrolimus  3 mg Oral BID  . Warfarin - Pharmacist Dosing Inpatient   Does not apply [phone]

## 2013-12-18 NOTE — Plan of Care (Signed)
Problem: Phase III Progression Outcomes Goal: Other Phase III Outcomes/Goals Outcome: Not Applicable Date Met:  38/88/75

## 2013-12-18 NOTE — Progress Notes (Signed)
Patient ID: [name]  female  [id number]    DOB: 11/22/1955    DOA: 12/15/2013  PCP: Charlynn Court, NP  Brief history of present illness  The patient is a 58 year old female with past medical history for CAD s/p LIMA to LAD, aortic stenosis s/p AVR in 2015, hepatitis C s/p liver transplant in 2011, [place], ESRD on HD, presented with chest pain episodes occurring during hemodialysis. EKG showed new T-wave inversions in the inferior leads. Cardiology was consulted and patient was started on heparin and nitroglycerin drip. Plan for cardiac cath on 11/6.d-dimer negative   Assessment/Plan: Principal Problem:   Unstable angina with history of CAD- s/p LIMA-LAD Feb. 2015,  Aortic stenosis, moderate-severe at cath Jan 2015. s/p Mechanical AVR 03/2013 - cardiac cath done, 70% mid LAD stenosis with patent LIMA to LAD otherwise nonobstructive disease - Per cardiology, plan for medical therapy  Active Problems:   End stage renal disease on dialysis-TTS - renal consulted    Dyslipidemia-LDL 104, not on statin with Hx of liver transplant    Chronic anticoagulation w/ coumadin, goal INR 2.5-3.5 w/ AVR - currently on heparin drip for bridging with Coumadin, awaiting INR to get therapeutic - Coumadin was held for cardiac cath, INR 1.57    DM type 2 (diabetes mellitus, type 2) - continue sliding scale insulin   DVT Prophylaxis:heparin drip + Coumadin  Code Status:full code  Family Communication:  Disposition:  Consultants:  Cardiology  renal  Procedures:  Cardiac catheterization  Antibiotics:  none    Subjective: Patient seen and examined earlier this morning, no complaints  Objective: Weight change: -0.2 kg (-7.1 oz)  Intake/Output Summary (Last 24 hours) at 12/18/13 1003 Last data filed at 12/17/13 1700  Gross per 24 hour  Intake    360 ml  Output   1977 ml  Net  -1617 ml   Blood pressure 140/52, pulse 66, temperature 98.2 F (36.8 C), temperature  source Oral, resp. rate 18, height 5\' 4"  (1.626 m), weight 73.7 kg (162 lb 7.7 oz), SpO2 100 %.  Physical Exam: General: Alert and awake, oriented x3, not in any acute distress. CVS: S1-S2 clear, no murmur rubs or gallops, +mechanical valve sounds Chest: CTAB Abdomen: soft NT, ND, NBS  Extremities: no c/c/e   Lab Results: Basic Metabolic Panel:  Recent Labs Lab 12/17/13 0830 12/18/13 0535  NA 140 136*  K 3.8 5.6*  CL 98 96  CO2 26 25  GLUCOSE 85 148*  BUN 29* 33*  CREATININE 5.10* 6.40*  CALCIUM 8.2* 8.1*   Liver Function Tests:  Recent Labs Lab 12/15/13 1342 12/17/13 0830  AST 76* 46*  ALT 39* 33  ALKPHOS 95 91  BILITOT 0.4 0.3  PROT 7.9 6.6  ALBUMIN 3.1* 2.8*   No results for input(s): LIPASE, AMYLASE in the last 168 hours. No results for input(s): AMMONIA in the last 168 hours. CBC:  Recent Labs Lab 12/17/13 0430 12/18/13 0535  WBC 5.0 6.0  HGB 11.6* 11.7*  HCT 35.5* 36.8  MCV 102.9* 104.2*  PLT 138* 145*   Cardiac Enzymes:  Recent Labs Lab 12/15/13 2120 12/16/13 0356 12/16/13 0908  TROPONINI <0.30 <0.30 <0.30   BNP: Invalid input(s): POCBNP CBG:  Recent Labs Lab 12/17/13 0616 12/17/13 1315 12/17/13 1637 12/17/13 2127 12/18/13 0607  GLUCAP 208* 110* 129* 157* 171*     Micro Results: Recent Results (from the past 240 hour(s))  MRSA PCR Screening     Status: None  Collection Time: 12/15/13  7:47 PM  Result Value Ref Range Status   MRSA by PCR NEGATIVE NEGATIVE Final    Comment:        The GeneXpert MRSA Assay (FDA approved for NASAL specimens only), is one component of a comprehensive MRSA colonization surveillance program. It is not intended to diagnose MRSA infection nor to guide or monitor treatment for MRSA infections.     Studies/Results: Dg Chest Portable 1 View  12/15/2013   EXAM: PORTABLE CHEST - 1 VIEW  COMPARISON:  None.  FINDINGS: The heart size and mediastinal contours are within normal limits. Both lungs  are clear. The visualized skeletal structures are unremarkable.  IMPRESSION: No active disease.   Electronically Signed   By: Franchot Gallo M.D.   On: 12/15/2013 14:27    Medications: Scheduled Meds: . amLODipine  10 mg Oral Daily  . aspirin EC  81 mg Oral Daily  . calcitRIOL  0.5 mcg Oral Q T,Th,Sa-HD  . calcium carbonate  1 tablet Oral Daily  . cinacalcet  30 mg Oral QPC breakfast  . DULoxetine  60 mg Oral QHS  . gabapentin  300 mg Oral QHS  . hydrALAZINE  25 mg Oral TID  . insulin aspart  0-15 Units Subcutaneous TID WC  . insulin aspart  0-5 Units Subcutaneous QHS  . levothyroxine  150 mcg Oral QAC breakfast  . metoprolol  50 mg Oral BID  . multivitamin with minerals  1 tablet Oral QHS  . polyethylene glycol  17 g Oral Daily  . tacrolimus  3 mg Oral BID  . Warfarin - Pharmacist Dosing Inpatient   Does not apply q1800      LOS: 3 days   Keonte Daubenspeck M.D. [facility] 12/18/2013, 10:03 AM Pager: [phone]  If 7PM-7AM, please contact night-coverage [email or web address] Password [email or web address]

## 2013-12-18 NOTE — Progress Notes (Signed)
ANTICOAGULATION CONSULT NOTE - Follow Up Consult  Pharmacy Consult:  Heparin Indication: mechanical heart valve  Allergies  Allergen Reactions  . Acetaminophen Other (See Comments)    Liver transplant recipient   . Codeine Itching  . Mirtazapine Other (See Comments)    hallucination  . Morphine Itching    Patient Measurements: Height: 5\' 4"  (162.6 cm) Weight: 162 lb 7.7 oz (73.7 kg) IBW/kg (Calculated) : 54.7 Heparin Dosing Weight: 70 kg  Vital Signs: Temp: 98.2 F (36.8 C) (11/08 0450) Temp Source: Oral (11/08 0450) BP: 116/38 mmHg (11/08 1022) Pulse Rate: 68 (11/08 1022)  Labs:  Recent Labs  12/15/13 2120  12/16/13 0356 12/16/13 0908 12/16/13 1239 12/17/13 0430 12/17/13 0830 12/17/13 1900 12/18/13 0535  HGB  --   < > 11.4*  --   --  11.6*  --   --  11.7*  HCT  --   --  36.6  --   --  35.5*  --   --  36.8  PLT  --   --  138*  --   --  138*  --   --  145*  LABPROT  --   < > 21.8*  --  20.7* 21.8*  --   --  18.9*  INR  --   < > 1.88*  --  1.76* 1.88*  --   --  1.57*  HEPARINUNFRC  --   < >  --  0.33  --   --  0.21* 0.45 0.51  CREATININE  --   < > 6.19*  --   --  8.45* 5.10*  --  6.40*  TROPONINI <0.30  --  <0.30 <0.30  --   --   --   --   --   < > = values in this interval not displayed.  Estimated Creatinine Clearance: 9.3 mL/min (by C-G formula based on Cr of 6.4).    Assessment: 58 yo female with mechanical AVR is s/p cath and is currently on heparin bridging to Coumadin.  Heparin level is therapeutic; INR decreased further as expected; no bleeding reported.   Goal of Therapy:  Heparin level 0.3-0.7 units/ml  INR 2.5-3 Monitor platelets by anticoagulation protocol: Yes    Plan:  - Repeat Coumadin 4mg  PO today - Continue heparin gtt at 1000 units/hr - Daily HL / CBC / PT / INR    Nefertiti Mohamad D. Mina Marble, PharmD, BCPS Pager:  [phone] 12/18/2013, 2:36 PM

## 2013-12-19 LAB — CBC
HEMATOCRIT: 35.3 % — AB (ref 36.0–46.0)
Hemoglobin: 11.2 g/dL — ABNORMAL LOW (ref 12.0–15.0)
MCH: 32.7 pg (ref 26.0–34.0)
MCHC: 31.7 g/dL (ref 30.0–36.0)
MCV: 102.9 fL — ABNORMAL HIGH (ref 78.0–100.0)
Platelets: 121 10*3/uL — ABNORMAL LOW (ref 150–400)
RBC: 3.43 MIL/uL — AB (ref 3.87–5.11)
RDW: 16.5 % — ABNORMAL HIGH (ref 11.5–15.5)
WBC: 4.9 10*3/uL (ref 4.0–10.5)

## 2013-12-19 LAB — HEPARIN LEVEL (UNFRACTIONATED): HEPARIN UNFRACTIONATED: 0.51 [IU]/mL (ref 0.30–0.70)

## 2013-12-19 LAB — GLUCOSE, CAPILLARY
GLUCOSE-CAPILLARY: 118 mg/dL — AB (ref 70–99)
GLUCOSE-CAPILLARY: 196 mg/dL — AB (ref 70–99)
Glucose-Capillary: 169 mg/dL — ABNORMAL HIGH (ref 70–99)
Glucose-Capillary: 106 mg/dL — ABNORMAL HIGH (ref 70–99)

## 2013-12-19 LAB — PROTIME-INR
INR: 1.77 — AB (ref 0.00–1.49)
PROTHROMBIN TIME: 20.8 s — AB (ref 11.6–15.2)

## 2013-12-19 LAB — BASIC METABOLIC PANEL
Anion gap: 18 — ABNORMAL HIGH (ref 5–15)
BUN: 47 mg/dL — AB (ref 6–23)
CHLORIDE: 96 meq/L (ref 96–112)
CO2: 22 meq/L (ref 19–32)
Calcium: 7.9 mg/dL — ABNORMAL LOW (ref 8.4–10.5)
Creatinine, Ser: 7.96 mg/dL — ABNORMAL HIGH (ref 0.50–1.10)
GFR calc Af Amer: 6 mL/min — ABNORMAL LOW (ref >90)
GFR calc non Af Amer: 5 mL/min — ABNORMAL LOW (ref >90)
GLUCOSE: 119 mg/dL — AB (ref 70–99)
POTASSIUM: 5.6 meq/L — AB (ref 3.7–5.3)
SODIUM: 136 meq/L — AB (ref 137–147)

## 2013-12-19 LAB — PHOSPHORUS: PHOSPHORUS: 5.8 mg/dL — AB (ref 2.3–4.6)

## 2013-12-19 MED ORDER — WARFARIN SODIUM 6 MG PO TABS
6.0000 mg | ORAL_TABLET | Freq: Once | ORAL | Status: AC
  Administered 2013-12-19: 6 mg via ORAL
  Filled 2013-12-19: qty 1

## 2013-12-19 NOTE — Progress Notes (Signed)
IV team called for replacement of out of date peripheral IVs. 2 RNs from IV team made 3 attempts to get new IV but were unsuccessful due to poor veins. IV team advised that current sites were intact with good blood return, just out of date.

## 2013-12-19 NOTE — Progress Notes (Signed)
ANTICOAGULATION CONSULT NOTE - Follow Up Consult  Pharmacy Consult:  Heparin / Coumadin Indication: mechanical heart valve  Allergies  Allergen Reactions  . Acetaminophen Other (See Comments)    Liver transplant recipient   . Codeine Itching  . Mirtazapine Other (See Comments)    hallucination  . Morphine Itching    Patient Measurements: Height: 5\' 4"  (162.6 cm) Weight: 170 lb 6.7 oz (77.3 kg) IBW/kg (Calculated) : 54.7 Heparin Dosing Weight: 70 kg  Vital Signs: Temp: 97.3 F (36.3 C) (11/09 0510) Temp Source: Oral (11/09 0510) BP: 112/37 mmHg (11/09 0510) Pulse Rate: 61 (11/09 0510)  Labs:  Recent Labs  12/16/13 0908  12/17/13 0430 12/17/13 0830 12/17/13 1900 12/18/13 0535 12/19/13 0425  HGB  --   < > 11.6*  --   --  11.7* 11.2*  HCT  --   --  35.5*  --   --  36.8 35.3*  PLT  --   --  138*  --   --  145* 121*  LABPROT  --   < > 21.8*  --   --  18.9* 20.8*  INR  --   < > 1.88*  --   --  1.57* 1.77*  HEPARINUNFRC 0.33  --   --  0.21* 0.45 0.51 0.51  CREATININE  --   < > 8.45* 5.10*  --  6.40* 7.96*  TROPONINI <0.30  --   --   --   --   --   --   < > = values in this interval not displayed.  Estimated Creatinine Clearance: 7.7 mL/min (by C-G formula based on Cr of 7.96).    Assessment: 58 yo female with mechanical AVR is s/p cath and is currently on heparin bridging to Coumadin.  Heparin level is therapeutic; INR = 1.77 (trending up)  Goal of Therapy:  Heparin level 0.3-0.7 units/ml  INR 2.5-3 Monitor platelets by anticoagulation protocol: Yes    Plan:  - Coumadin 6 mg po x 1 dose at 1800 pm - Continue heparin gtt at 1000 units/hr - Daily HL / CBC / PT / INR  Thank you Anette Guarneri, PharmD [phone]  12/19/2013, 8:26 AM

## 2013-12-19 NOTE — Progress Notes (Signed)
S: No CP.  Eating well. Up walking.  No new CO O:BP 112/37 mmHg  Pulse 61  Temp(Src) 97.3 F (36.3 C) (Oral)  Resp 18  Ht 5\' 4"  (1.626 m)  Wt 77.3 kg (170 lb 6.7 oz)  BMI 29.24 kg/m2  SpO2 100%  Intake/Output Summary (Last 24 hours) at 12/19/13 1039 Last data filed at 12/19/13 0900  Gross per 24 hour  Intake 711.33 ml  Output      0 ml  Net 711.33 ml   Weight change: 1.6 kg (3 lb 8.4 oz) HAL:PFXTK and alert CVS:RRR 2/6 systolic M with click Resp: clear Abd:+ BS NTND Ext:no edema Lt thigh AVG + bruit NEURO:CNI Ox3 no asterixis   . amLODipine  10 mg Oral Daily  . aspirin EC  81 mg Oral Daily  . calcitRIOL  0.5 mcg Oral Q T,Th,Sa-HD  . calcium carbonate  1 tablet Oral Daily  . cinacalcet  30 mg Oral QPC breakfast  . DULoxetine  60 mg Oral QHS  . gabapentin  300 mg Oral QHS  . hydrALAZINE  25 mg Oral TID  . insulin aspart  0-15 Units Subcutaneous TID WC  . insulin aspart  0-5 Units Subcutaneous QHS  . levothyroxine  150 mcg Oral QAC breakfast  . metoprolol  50 mg Oral BID  . multivitamin with minerals  1 tablet Oral QHS  . polyethylene glycol  17 g Oral Daily  . tacrolimus  3 mg Oral BID  . warfarin  6 mg Oral ONCE-1800  . Warfarin - Pharmacist Dosing Inpatient   Does not apply q1800   No results found. BMET    Component Value Date/Time   NA 136* 12/19/2013 0425   K 5.6* 12/19/2013 0425   CL 96 12/19/2013 0425   CO2 22 12/19/2013 0425   GLUCOSE 119* 12/19/2013 0425   BUN 47* 12/19/2013 0425   CREATININE 7.96* 12/19/2013 0425   CALCIUM 7.9* 12/19/2013 0425   GFRNONAA 5* 12/19/2013 0425   GFRAA 6* 12/19/2013 0425   CBC    Component Value Date/Time   WBC 4.9 12/19/2013 0425   RBC 3.43* 12/19/2013 0425   HGB 11.2* 12/19/2013 0425   HCT 35.3* 12/19/2013 0425   PLT 121* 12/19/2013 0425   MCV 102.9* 12/19/2013 0425   MCH 32.7 12/19/2013 0425   MCHC 31.7 12/19/2013 0425   RDW 16.5* 12/19/2013 0425   LYMPHSABS 1.2 11/03/2013 1205   MONOABS 0.6 11/03/2013  1205   EOSABS 0.1 11/03/2013 1205   BASOSABS 0.0 11/03/2013 1205     Assessment: 1. Cp SP cath, medical management 2. AVR on coumadin 3. Anemia  ESA on hold 4. Sec HPTH on calcitriol and sensipar 5. ESRD Plan: 1.  HD in AM 2. Check PO4 3.  Awaiting therapeutic INR   Ernest Popowski T

## 2013-12-19 NOTE — Progress Notes (Signed)
Subjective: No complaints, drowsy after taking ultram.  Falls to sleep easily.   Objective: Vital signs in last 24 hours: Temp:  [97.3 F (36.3 C)-98.4 F (36.9 C)] 97.3 F (36.3 C) (11/09 0510) Pulse Rate:  [61-68] 61 (11/09 0510) Resp:  [18] 18 (11/09 0510) BP: (112-157)/(37-52) 112/37 mmHg (11/09 0510) SpO2:  [100 %] 100 % (11/09 0510) Weight:  [170 lb 6.7 oz (77.3 kg)] 170 lb 6.7 oz (77.3 kg) (11/09 0510) Weight change: 3 lb 8.4 oz (1.6 kg) Last BM Date: 12/18/13 Intake/Output from previous day: +831 11/08 0701 - 11/09 0700 In: 831.3 [P.O.:600; I.V.:231.3] Out: -  Intake/Output this shift: Total I/O In: 120 [P.O.:120] Out: -   PE: General:Pleasant affect, NAD Skin:Warm and dry, brisk capillary refill HEENT:normocephalic, sclera clear, mucus membranes moist Neck:supple, no JVD, no bruits  Heart:S1S2 RRR without murmur but crisp closure of valve, no gallup, rub or click Lungs:clear ant.without rales, rhonchi, or wheezes FAO:ZHYQ, non tender, + BS, do not palpate liver spleen or masses Ext:no lower ext edema, 2+ pedal pulses, 2+ radial pulses Neuro:alert and oriented X 3, MAE, follows commands, + facial symmetry TELE:  SR  Lab Results:  Recent Labs  12/18/13 0535 12/19/13 0425  WBC 6.0 4.9  HGB 11.7* 11.2*  HCT 36.8 35.3*  PLT 145* 121*   BMET  Recent Labs  12/18/13 0535 12/19/13 0425  NA 136* 136*  K 5.6* 5.6*  CL 96 96  CO2 25 22  GLUCOSE 148* 119*  BUN 33* 47*  CREATININE 6.40* 7.96*  CALCIUM 8.1* 7.9*   No results for input(s): TROPONINI in the last 72 hours.  Invalid input(s): CK, MB  Lab Results  Component Value Date   CHOL 225* 12/22/2011   HDL 97 12/22/2011   LDLCALC 104* 12/22/2011   TRIG 121 12/22/2011   CHOLHDL 2.3 12/22/2011   Lab Results  Component Value Date   HGBA1C 6.3* 12/15/2013     Lab Results  Component Value Date   TSH 0.086* 07/03/2013    Hepatic Function Panel  Recent Labs  12/17/13 0830    PROT 6.6  ALBUMIN 2.8*  AST 46*  ALT 33  ALKPHOS 91  BILITOT 0.3   No results for input(s): CHOL in the last 72 hours. No results for input(s): PROTIME in the last 72 hours.     Studies/Results: No results found.  Medications: I have reviewed the patient's current medications. Scheduled Meds: . amLODipine  10 mg Oral Daily  . aspirin EC  81 mg Oral Daily  . calcitRIOL  0.5 mcg Oral Q T,Th,Sa-HD  . calcium carbonate  1 tablet Oral Daily  . cinacalcet  30 mg Oral QPC breakfast  . DULoxetine  60 mg Oral QHS  . gabapentin  300 mg Oral QHS  . hydrALAZINE  25 mg Oral TID  . insulin aspart  0-15 Units Subcutaneous TID WC  . insulin aspart  0-5 Units Subcutaneous QHS  . levothyroxine  150 mcg Oral QAC breakfast  . metoprolol  50 mg Oral BID  . multivitamin with minerals  1 tablet Oral QHS  . polyethylene glycol  17 g Oral Daily  . tacrolimus  3 mg Oral BID  . warfarin  6 mg Oral ONCE-1800  . Warfarin - Pharmacist Dosing Inpatient   Does not apply q1800   Continuous Infusions: . heparin 1,000 Units/hr (12/19/13 0508)   PRN Meds:.acetaminophen **OR** acetaminophen, albuterol, HYDROmorphone (DILAUDID) injection, lactulose, LORazepam, ondansetron **OR**  ondansetron (ZOFRAN) IV, ondansetron (ZOFRAN) IV, promethazine, promethazine, traMADol, zolpidem  Assessment/Plan: 1. Chest pain syndrome with negative cardiac markers, now status post cardiac catheterization demonstrating 70% mid LAD with patent LIMA to LAD, otherwise nonobstructive disease. Plan is for medical therapy. Follow up with Dr. [name] as already arranged on 12/28/13.  We can sign off call if any questions.  2. History of aortic stenosis now status post mechanical AVR in February. Coumadin was held for heart catheterization, now on heparin bridge. INR 1.77.  Once d/c'd [facility]  3. ESRD on hemodialysis.   LOS: 4 days   Time spent with pt. :15 minutes. [facility] R  Nurse Practitioner  Certified Pager [phone] or after 5pm and on weekends call [phone] 12/19/2013, 9:14 AM   I have personally seen and examined this patient with Cecilie Kicks, NP.  I agree with the assessment and plan as outlined above. No chest pain. Stable from cardiac standpoint. Awaiting therapeutic INR with mechanical AVR in place. Will sign off. Follow up arranged in [place] office.   Brendalyn Vallely 12/19/2013 11:59 AM

## 2013-12-19 NOTE — Progress Notes (Signed)
Patient ID: [name]  female  OVF:[id number]    DOB: May 09, 1955    DOA: 12/15/2013  PCP: Charlynn Court, NP  Brief history of present illness  The patient is a 58 year old female with past medical history for CAD s/p LIMA to LAD, aortic stenosis s/p AVR in 2015, hepatitis C s/p liver transplant in 2011, [place], ESRD on HD, presented with chest pain episodes occurring during hemodialysis. EKG showed new T-wave inversions in the inferior leads. Cardiology was consulted and patient was started on heparin and nitroglycerin drip.  Cardiac cath done on 11/6 showed focal 70% mid LAD stenosis, patent LIMA to mid LAD, normal right coronary artery. Patient currently on heparin drip with Coumadin, awaiting INR to be therapeutic  Assessment/Plan: Principal Problem:   Unstable angina with history of CAD- s/p LIMA-LAD Feb. 2015,  Aortic stenosis, moderate-severe at cath Jan 2015. s/p Mechanical AVR 03/2013 - cardiac cath done, 70% mid LAD stenosis with patent LIMA to LAD otherwise nonobstructive disease - Per cardiology, plan for medical therapy  Active Problems:   End stage renal disease on dialysis-TTS - renal consulted    Dyslipidemia-LDL 104, not on statin with Hx of liver transplant    Chronic anticoagulation w/ coumadin, goal INR 2.5-3.5 w/ AVR - currently on heparin drip for bridging with Coumadin, awaiting INR to get therapeutic - Coumadin was held for cardiac cath, INR still 1.77    DM type 2 (diabetes mellitus, type 2) - continue sliding scale insulin - CBGs controlled  DVT Prophylaxis:heparin drip + Coumadin  Code Status:full code  Family Communication:  Disposition:  Consultants:  Cardiology  renal  Procedures:  Cardiac catheterization  Antibiotics:  none    Subjective: Patient seen and examined earlier this morning, no complaints, sleepy, awaiting INR to be therapeutic   Objective: Weight change: 1.6 kg (3 lb 8.4 oz)  Intake/Output Summary (Last  24 hours) at 12/19/13 1011 Last data filed at 12/19/13 0900  Gross per 24 hour  Intake 711.33 ml  Output      0 ml  Net 711.33 ml   Blood pressure 112/37, pulse 61, temperature 97.3 F (36.3 C), temperature source Oral, resp. rate 18, height 5\' 4"  (1.626 m), weight 77.3 kg (170 lb 6.7 oz), SpO2 100 %.  Physical Exam: General: Alert and awake, oriented x3, not in any acute distress. CVS: S1-S2 clear, no murmur rubs or gallops, +mechanical valve sounds Chest: CTAB Abdomen: soft NT, ND, NBS  Extremities: no c/c/e   Lab Results: Basic Metabolic Panel:  Recent Labs Lab 12/18/13 0535 12/19/13 0425  NA 136* 136*  K 5.6* 5.6*  CL 96 96  CO2 25 22  GLUCOSE 148* 119*  BUN 33* 47*  CREATININE 6.40* 7.96*  CALCIUM 8.1* 7.9*   Liver Function Tests:  Recent Labs Lab 12/15/13 1342 12/17/13 0830  AST 76* 46*  ALT 39* 33  ALKPHOS 95 91  BILITOT 0.4 0.3  PROT 7.9 6.6  ALBUMIN 3.1* 2.8*   No results for input(s): LIPASE, AMYLASE in the last 168 hours. No results for input(s): AMMONIA in the last 168 hours. CBC:  Recent Labs Lab 12/18/13 0535 12/19/13 0425  WBC 6.0 4.9  HGB 11.7* 11.2*  HCT 36.8 35.3*  MCV 104.2* 102.9*  PLT 145* 121*   Cardiac Enzymes:  Recent Labs Lab 12/15/13 2120 12/16/13 0356 12/16/13 0908  TROPONINI <0.30 <0.30 <0.30   BNP: Invalid input(s): POCBNP CBG:  Recent Labs Lab 12/18/13 1024 12/18/13 1136 12/18/13 1616  12/18/13 2054 12/19/13 0559  GLUCAP 103* 106* 197* 109* 118*     Micro Results: Recent Results (from the past 240 hour(s))  MRSA PCR Screening     Status: None   Collection Time: 12/15/13  7:47 PM  Result Value Ref Range Status   MRSA by PCR NEGATIVE NEGATIVE Final    Comment:        The GeneXpert MRSA Assay (FDA approved for NASAL specimens only), is one component of a comprehensive MRSA colonization surveillance program. It is not intended to diagnose MRSA infection nor to guide or monitor treatment  for MRSA infections.     Studies/Results: Dg Chest Portable 1 View  12/15/2013   EXAM: PORTABLE CHEST - 1 VIEW  COMPARISON:  None.  FINDINGS: The heart size and mediastinal contours are within normal limits. Both lungs are clear. The visualized skeletal structures are unremarkable.  IMPRESSION: No active disease.   Electronically Signed   By: Franchot Gallo M.D.   On: 12/15/2013 14:27    Medications: Scheduled Meds: . amLODipine  10 mg Oral Daily  . aspirin EC  81 mg Oral Daily  . calcitRIOL  0.5 mcg Oral Q T,Th,Sa-HD  . calcium carbonate  1 tablet Oral Daily  . cinacalcet  30 mg Oral QPC breakfast  . DULoxetine  60 mg Oral QHS  . gabapentin  300 mg Oral QHS  . hydrALAZINE  25 mg Oral TID  . insulin aspart  0-15 Units Subcutaneous TID WC  . insulin aspart  0-5 Units Subcutaneous QHS  . levothyroxine  150 mcg Oral QAC breakfast  . metoprolol  50 mg Oral BID  . multivitamin with minerals  1 tablet Oral QHS  . polyethylene glycol  17 g Oral Daily  . tacrolimus  3 mg Oral BID  . warfarin  6 mg Oral ONCE-1800  . Warfarin - Pharmacist Dosing Inpatient   Does not apply q1800      LOS: 4 days   RAI,RIPUDEEP M.D. [facility] 12/19/2013, 10:11 AM Pager: [phone]  If 7PM-7AM, please contact night-coverage [email or web address] Password [email or web address]

## 2013-12-19 NOTE — Plan of Care (Signed)
Problem: Phase III Progression Outcomes Goal: Discharge plan remains appropriate-arrangements made Outcome: Progressing Waiting for INR to become therapeutic  Problem: Discharge Progression Outcomes Goal: Tolerates diet Outcome: Completed/Met Date Met:  12/19/13 Goal: Activity appropriate for discharge plan Outcome: Completed/Met Date Met:  12/19/13

## 2013-12-20 DIAGNOSIS — R079 Chest pain, unspecified: Secondary | ICD-10-CM

## 2013-12-20 LAB — GLUCOSE, CAPILLARY
GLUCOSE-CAPILLARY: 148 mg/dL — AB (ref 70–99)
Glucose-Capillary: 127 mg/dL — ABNORMAL HIGH (ref 70–99)
Glucose-Capillary: 100 mg/dL — ABNORMAL HIGH (ref 70–99)
Glucose-Capillary: 153 mg/dL — ABNORMAL HIGH (ref 70–99)

## 2013-12-20 LAB — CBC
HEMATOCRIT: 33.4 % — AB (ref 36.0–46.0)
HEMOGLOBIN: 10.7 g/dL — AB (ref 12.0–15.0)
MCH: 33.6 pg (ref 26.0–34.0)
MCHC: 32 g/dL (ref 30.0–36.0)
MCV: 105 fL — ABNORMAL HIGH (ref 78.0–100.0)
Platelets: 97 10*3/uL — ABNORMAL LOW (ref 150–400)
RBC: 3.18 MIL/uL — AB (ref 3.87–5.11)
RDW: 16.6 % — ABNORMAL HIGH (ref 11.5–15.5)
WBC: 5.3 10*3/uL (ref 4.0–10.5)

## 2013-12-20 LAB — PROTIME-INR
INR: 2.28 — ABNORMAL HIGH (ref 0.00–1.49)
PROTHROMBIN TIME: 25.3 s — AB (ref 11.6–15.2)

## 2013-12-20 LAB — HEPARIN LEVEL (UNFRACTIONATED): Heparin Unfractionated: 0.37 IU/mL (ref 0.30–0.70)

## 2013-12-20 MED ORDER — WARFARIN SODIUM 6 MG PO TABS
6.0000 mg | ORAL_TABLET | Freq: Once | ORAL | Status: AC
  Administered 2013-12-20: 6 mg via ORAL
  Filled 2013-12-20: qty 1

## 2013-12-20 MED ORDER — WARFARIN SODIUM 4 MG PO TABS
4.0000 mg | ORAL_TABLET | Freq: Every day | ORAL | Status: DC
  Filled 2013-12-20: qty 1

## 2013-12-20 NOTE — Progress Notes (Signed)
Patient ID: [name]  female  HCW:[id number]    DOB: January 25, 1956    DOA: 12/15/2013  PCP: Charlynn Court, NP  Brief history of present illness  The patient is a 58 year old female with past medical history for CAD s/p LIMA to LAD, aortic stenosis s/p AVR in 2015, hepatitis C s/p liver transplant in 2011, [place], ESRD on HD, presented with chest pain episodes occurring during hemodialysis. EKG showed new T-wave inversions in the inferior leads. Cardiology was consulted and patient was started on heparin and nitroglycerin drip.  Cardiac cath done on 11/6 showed focal 70% mid LAD stenosis, patent LIMA to mid LAD, normal right coronary artery. Patient currently on heparin drip with Coumadin, awaiting INR to be therapeutic  Assessment/Plan: Principal Problem:   Unstable angina with history of CAD- s/p LIMA-LAD Feb. 2015,  Aortic stenosis, moderate-severe at cath Jan 2015. s/p Mechanical AVR 03/2013 - cardiac cath done, 70% mid LAD stenosis with patent LIMA to LAD otherwise nonobstructive disease - Per cardiology, plan for medical therapy  Active Problems:   End stage renal disease on dialysis-TTS - renal consulted    Dyslipidemia-LDL 104, not on statin with Hx of liver transplant    Chronic anticoagulation w/ coumadin, goal INR 2.5-3.5 w/ AVR - currently on heparin drip for bridging with Coumadin, awaiting INR to get therapeutic - Coumadin was held for cardiac cath, INR still 2.28, goal >2.5    DM type 2 (diabetes mellitus, type 2) - continue sliding scale insulin - CBGs controlled  DVT Prophylaxis:heparin drip + Coumadin  Code Status:full code  Family Communication:  Disposition:hopefully DC home tomorrow if INR therapeutic  Consultants:  Cardiology  renal  Procedures:  Cardiac catheterization  Antibiotics:  none    Subjective: Patient seen and examined, no complaints  Objective: Weight change: 1.807 kg (3 lb 15.8 oz)  Intake/Output Summary (Last 24  hours) at 12/20/13 1150 Last data filed at 12/20/13 0745  Gross per 24 hour  Intake    360 ml  Output      0 ml  Net    360 ml   Blood pressure 107/54, pulse 65, temperature 98 F (36.7 C), temperature source Oral, resp. rate 16, height 5\' 4"  (1.626 m), weight 79.7 kg (175 lb 11.3 oz), SpO2 97 %.  Physical Exam: General: Alert and awake, oriented x3, not in any acute distress. CVS: S1-S2 clear, +mechanical valve sounds Chest: CTAB Abdomen: soft NT, ND, NBS  Extremities: no c/c/e   Lab Results: Basic Metabolic Panel:  Recent Labs Lab 12/18/13 0535 12/19/13 0425  NA 136* 136*  K 5.6* 5.6*  CL 96 96  CO2 25 22  GLUCOSE 148* 119*  BUN 33* 47*  CREATININE 6.40* 7.96*  CALCIUM 8.1* 7.9*  PHOS  --  5.8*   Liver Function Tests:  Recent Labs Lab 12/15/13 1342 12/17/13 0830  AST 76* 46*  ALT 39* 33  ALKPHOS 95 91  BILITOT 0.4 0.3  PROT 7.9 6.6  ALBUMIN 3.1* 2.8*   No results for input(s): LIPASE, AMYLASE in the last 168 hours. No results for input(s): AMMONIA in the last 168 hours. CBC:  Recent Labs Lab 12/19/13 0425 12/20/13 0526  WBC 4.9 5.3  HGB 11.2* 10.7*  HCT 35.3* 33.4*  MCV 102.9* 105.0*  PLT 121* 97*   Cardiac Enzymes:  Recent Labs Lab 12/15/13 2120 12/16/13 0356 12/16/13 0908  TROPONINI <0.30 <0.30 <0.30   BNP: Invalid input(s): POCBNP CBG:  Recent Labs Lab 12/19/13  0559 12/19/13 1118 12/19/13 1620 12/19/13 2047 12/20/13 0627  GLUCAP 118* 169* 106* 196* 127*     Micro Results: Recent Results (from the past 240 hour(s))  MRSA PCR Screening     Status: None   Collection Time: 12/15/13  7:47 PM  Result Value Ref Range Status   MRSA by PCR NEGATIVE NEGATIVE Final    Comment:        The GeneXpert MRSA Assay (FDA approved for NASAL specimens only), is one component of a comprehensive MRSA colonization surveillance program. It is not intended to diagnose MRSA infection nor to guide or monitor treatment for MRSA  infections.     Studies/Results: Dg Chest Portable 1 View  12/15/2013   EXAM: PORTABLE CHEST - 1 VIEW  COMPARISON:  None.  FINDINGS: The heart size and mediastinal contours are within normal limits. Both lungs are clear. The visualized skeletal structures are unremarkable.  IMPRESSION: No active disease.   Electronically Signed   By: Franchot Gallo M.D.   On: 12/15/2013 14:27    Medications: Scheduled Meds: . amLODipine  10 mg Oral Daily  . aspirin EC  81 mg Oral Daily  . calcitRIOL  0.5 mcg Oral Q T,Th,Sa-HD  . calcium carbonate  1 tablet Oral Daily  . cinacalcet  30 mg Oral QPC breakfast  . DULoxetine  60 mg Oral QHS  . gabapentin  300 mg Oral QHS  . hydrALAZINE  25 mg Oral TID  . insulin aspart  0-15 Units Subcutaneous TID WC  . insulin aspart  0-5 Units Subcutaneous QHS  . levothyroxine  150 mcg Oral QAC breakfast  . metoprolol  50 mg Oral BID  . multivitamin with minerals  1 tablet Oral QHS  . polyethylene glycol  17 g Oral Daily  . tacrolimus  3 mg Oral BID  . [START ON 12/21/2013] warfarin  4 mg Oral q1800  . warfarin  6 mg Oral ONCE-1800  . Warfarin - Pharmacist Dosing Inpatient   Does not apply q1800      LOS: 5 days   RAI,RIPUDEEP M.D. [facility] 12/20/2013, 11:50 AM Pager: [phone]  If 7PM-7AM, please contact night-coverage [email or web address] Password [email or web address]

## 2013-12-20 NOTE — Procedures (Signed)
Pt seen on HD. Ap 80 Vp 100  BFR 300 to increase to 400.  Wt today was 79.7?  Hard to believe she is 6kg above DW.  Will pull 3L today.  SBP 138.

## 2013-12-20 NOTE — Progress Notes (Signed)
ANTICOAGULATION CONSULT NOTE - Follow Up Consult  Pharmacy Consult:  Heparin / Coumadin Indication: mechanical heart valve  Allergies  Allergen Reactions  . Acetaminophen Other (See Comments)    Liver transplant recipient   . Codeine Itching  . Mirtazapine Other (See Comments)    hallucination  . Morphine Itching    Patient Measurements: Height: 5\' 4"  (162.6 cm) Weight: 175 lb 11.3 oz (79.7 kg) (Standing) IBW/kg (Calculated) : 54.7 Heparin Dosing Weight: 70 kg  Vital Signs: Temp: 98 F (36.7 C) (11/10 0810) Temp Source: Oral (11/10 0810) BP: 91/51 mmHg (11/10 0930) Pulse Rate: 67 (11/10 0930)  Labs:  Recent Labs  12/18/13 0535 12/19/13 0425 12/20/13 0526  HGB 11.7* 11.2* 10.7*  HCT 36.8 35.3* 33.4*  PLT 145* 121* 97*  LABPROT 18.9* 20.8* 25.3*  INR 1.57* 1.77* 2.28*  HEPARINUNFRC 0.51 0.51 0.37  CREATININE 6.40* 7.96*  --     Estimated Creatinine Clearance: 7.8 mL/min (by C-G formula based on Cr of 7.96).   Assessment: 58 yo female with mechanical AVR is s/p cath and is currently on heparin bridging to Coumadin.  Heparin level is therapeutic; INR = 2.28 (trending up)  Goal of Therapy:  Heparin level 0.3-0.7 units/ml  INR 2.5-3 Monitor platelets by anticoagulation protocol: Yes   Plan:  - Coumadin 6 mg po x 1 dose at 1800 pm then 4 mg po daily - Continue heparin gtt at 1000 units/hr - Daily HL / CBC / PT / INR  Thank you Anette Guarneri, PharmD [phone]  12/20/2013, 9:41 AM

## 2013-12-21 LAB — PROTIME-INR
INR: 2.79 — AB (ref 0.00–1.49)
Prothrombin Time: 29.6 seconds — ABNORMAL HIGH (ref 11.6–15.2)

## 2013-12-21 LAB — CBC
HEMATOCRIT: 31.2 % — AB (ref 36.0–46.0)
HEMOGLOBIN: 10 g/dL — AB (ref 12.0–15.0)
MCH: 32.7 pg (ref 26.0–34.0)
MCHC: 32.1 g/dL (ref 30.0–36.0)
MCV: 102 fL — ABNORMAL HIGH (ref 78.0–100.0)
Platelets: 116 10*3/uL — ABNORMAL LOW (ref 150–400)
RBC: 3.06 MIL/uL — AB (ref 3.87–5.11)
RDW: 16.7 % — ABNORMAL HIGH (ref 11.5–15.5)
WBC: 4.8 10*3/uL (ref 4.0–10.5)

## 2013-12-21 LAB — GLUCOSE, CAPILLARY
GLUCOSE-CAPILLARY: 102 mg/dL — AB (ref 70–99)
Glucose-Capillary: 158 mg/dL — ABNORMAL HIGH (ref 70–99)

## 2013-12-21 LAB — HEPARIN LEVEL (UNFRACTIONATED): HEPARIN UNFRACTIONATED: 0.35 [IU]/mL (ref 0.30–0.70)

## 2013-12-21 MED ORDER — SODIUM POLYSTYRENE SULFONATE 15 GM/60ML PO SUSP
15.0000 g | Freq: Once | ORAL | Status: AC
  Administered 2013-12-21: 15 g via ORAL
  Filled 2013-12-21: qty 60

## 2013-12-21 MED ORDER — WARFARIN SODIUM 4 MG PO TABS: 4.0000 mg | ORAL_TABLET | Freq: Every day | ORAL | Status: DC

## 2013-12-21 NOTE — Progress Notes (Signed)
S: Some bleeding from AVG yest after HD.  Now stopped and she is off heparin gtt O:BP 122/40 mmHg  Pulse 66  Temp(Src) 98 F (36.7 C) (Oral)  Resp 18  Ht 5\' 4"  (1.626 m)  Wt 77.7 kg (171 lb 4.8 oz)  BMI 29.39 kg/m2  SpO2 98%  Intake/Output Summary (Last 24 hours) at 12/21/13 0714 Last data filed at 12/20/13 1700  Gross per 24 hour  Intake    360 ml  Output   1374 ml  Net  -1014 ml   Weight change: 0.593 kg (1 lb 4.9 oz) QKM:MNOTR and alert CVS:RRR 2/6 systolic M with click Resp: clear Abd:+ BS NTND Ext:no edema Lt thigh AVG + bruit, no overt bleeding seen thru bandages NEURO:CNI Ox3 no asterixis   . amLODipine  10 mg Oral Daily  . aspirin EC  81 mg Oral Daily  . calcitRIOL  0.5 mcg Oral Q T,Th,Sa-HD  . calcium carbonate  1 tablet Oral Daily  . cinacalcet  30 mg Oral QPC breakfast  . DULoxetine  60 mg Oral QHS  . gabapentin  300 mg Oral QHS  . hydrALAZINE  25 mg Oral TID  . insulin aspart  0-15 Units Subcutaneous TID WC  . insulin aspart  0-5 Units Subcutaneous QHS  . levothyroxine  150 mcg Oral QAC breakfast  . metoprolol  50 mg Oral BID  . multivitamin with minerals  1 tablet Oral QHS  . polyethylene glycol  17 g Oral Daily  . tacrolimus  3 mg Oral BID  . warfarin  4 mg Oral q1800  . Warfarin - Pharmacist Dosing Inpatient   Does not apply q1800   No results found. BMET    Component Value Date/Time   NA 136* 12/19/2013 0425   K 5.6* 12/19/2013 0425   CL 96 12/19/2013 0425   CO2 22 12/19/2013 0425   GLUCOSE 119* 12/19/2013 0425   BUN 47* 12/19/2013 0425   CREATININE 7.96* 12/19/2013 0425   CALCIUM 7.9* 12/19/2013 0425   GFRNONAA 5* 12/19/2013 0425   GFRAA 6* 12/19/2013 0425   CBC    Component Value Date/Time   WBC 4.8 12/21/2013 0420   RBC 3.06* 12/21/2013 0420   HGB 10.0* 12/21/2013 0420   HCT 31.2* 12/21/2013 0420   PLT 116* 12/21/2013 0420   MCV 102.0* 12/21/2013 0420   MCH 32.7 12/21/2013 0420   MCHC 32.1 12/21/2013 0420   RDW 16.7* 12/21/2013  0420   LYMPHSABS 1.2 11/03/2013 1205   MONOABS 0.6 11/03/2013 1205   EOSABS 0.1 11/03/2013 1205   BASOSABS 0.0 11/03/2013 1205     Assessment: 1. Cp SP cath, medical management 2. AVR on coumadin 3. Anemia  ESA on hold 4. Sec HPTH on calcitriol and sensipar 5. ESRD Plan: 1.  Anticipate DC today.  Will notify her dialysis unit.  Trisha Morandi T

## 2013-12-21 NOTE — Discharge Summary (Signed)
Physician Discharge Summary  [name] MRN: [id number] DOB/AGE: Oct 17, 1955 58 y.o.  PCP: Charlynn Court, NP   Admit date: 12/15/2013 Discharge date: 12/21/2013  Discharge Diagnoses:     Unstable angina Active Problems:   End stage renal disease on dialysis-TTS   CAD- s/p LIMA-LAD Feb. 2015   Aortic stenosis, moderate-severe at cath Jan 2015. 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     amLODipine 10 MG tablet  Commonly known as:  NORVASC  Take 10 mg by mouth daily.     aspirin EC 81 MG tablet  Take 81 mg by mouth daily.     calcium carbonate 500 MG chewable tablet  Commonly known as:  TUMS - dosed in mg elemental calcium  Chew 1 tablet by mouth daily.     cinacalcet 30 MG tablet  Commonly known as:  SENSIPAR  Take 30 mg by mouth daily.     DULoxetine 60 MG capsule  Commonly known as:  CYMBALTA  Take 60 mg by mouth at bedtime.     gabapentin 300 MG capsule  Commonly known as:  NEURONTIN  Take 300 mg by mouth at bedtime.     hydrALAZINE 25 MG tablet  Commonly known as:  APRESOLINE  Take 25 mg by mouth 3 (three) times daily.     insulin detemir 100 UNIT/ML injection  Commonly known as:  LEVEMIR  - Inject 2-5 Units into the skin at bedtime. 150-200 do 2 units  - >200 do 5 units     levothyroxine 150 MCG tablet  Commonly known as:  SYNTHROID, LEVOTHROID  Take 150 mcg by mouth daily  before breakfast.     lidocaine 5 %  Commonly known as:  LIDODERM  Place 1 patch onto the skin daily. Remove & Discard patch within 12 hours or as directed by MD     LORazepam 0.5 MG tablet  Commonly known as:  ATIVAN  Take 0.5 mg by mouth at bedtime as needed for sleep.     metoprolol 50 MG tablet  Commonly known as:  LOPRESSOR  Take 50 mg by mouth 2 (two) times daily.     multivitamin with minerals Tabs tablet  Take 1 tablet by mouth at bedtime.     ondansetron 4 MG disintegrating tablet  Commonly known as:  ZOFRAN-ODT  Take 4 mg by mouth every 8 (eight) hours as needed for nausea or vomiting.     polyethylene glycol packet  Commonly known as:  MIRALAX / GLYCOLAX  Take 17 g by mouth as needed.     promethazine 12.5 MG tablet  Commonly known as:  PHENERGAN  Take 12.5 mg by mouth every 4 (four) hours as needed for nausea.     tacrolimus 1 MG capsule  Commonly known as:  PROGRAF  Take 3 mg  by mouth 2 (two) times daily.     traMADol 50 MG tablet  Commonly known as:  ULTRAM  Take 50 mg by mouth every 12 (twelve) hours as needed for moderate pain.     VELPHORO 500 MG chewable tablet  Generic drug:  sucroferric oxyhydroxide  Chew 1 tablet by mouth 3 (three) times daily with meals.     warfarin 4 MG tablet  Commonly known as:  COUMADIN  Take 1 tablet (4 mg total) by mouth daily at 6 PM.        Discharge Condition: *stable  Disposition: 01-Home or Self Care   Consults: cardiology Nephrology  Significant Diagnostic Studies: Dg Chest Portable 1 View  12/15/2013   EXAM: PORTABLE CHEST - 1 VIEW  COMPARISON:  None.  FINDINGS: The heart size and mediastinal contours are within normal limits. Both lungs are clear. The visualized skeletal structures are unremarkable.  IMPRESSION: No active disease.   Electronically Signed   By: Franchot Gallo M.D.   On: 12/15/2013 14:27      Microbiology: Recent Results (from the past 240 hour(s))  MRSA PCR Screening     Status: None    Collection Time: 12/15/13  7:47 PM  Result Value Ref Range Status   MRSA by PCR NEGATIVE NEGATIVE Final    Comment:        The GeneXpert MRSA Assay (FDA approved for NASAL specimens only), is one component of a comprehensive MRSA colonization surveillance program. It is not intended to diagnose MRSA infection nor to guide or monitor treatment for MRSA infections.      Labs: Results for orders placed or performed during the hospital encounter of 12/15/13 (from the past 48 hour(s))  Glucose, capillary     Status: Abnormal   Collection Time: 12/19/13 11:18 AM  Result Value Ref Range   Glucose-Capillary 169 (H) 70 - 99 mg/dL   Comment 1 Notify RN    Comment 2 Documented in Chart   Glucose, capillary     Status: Abnormal   Collection Time: 12/19/13  4:20 PM  Result Value Ref Range   Glucose-Capillary 106 (H) 70 - 99 mg/dL   Comment 1 Notify RN    Comment 2 Documented in Chart   Glucose, capillary     Status: Abnormal   Collection Time: 12/19/13  8:47 PM  Result Value Ref Range   Glucose-Capillary 196 (H) 70 - 99 mg/dL   Comment 1 Documented in Chart    Comment 2 Notify RN   CBC     Status: Abnormal   Collection Time: 12/20/13  5:26 AM  Result Value Ref Range   WBC 5.3 4.0 - 10.5 K/uL   RBC 3.18 (L) 3.87 - 5.11 MIL/uL   Hemoglobin 10.7 (L) 12.0 - 15.0 g/dL   HCT 33.4 (L) 36.0 - 46.0 %   MCV 105.0 (H) 78.0 - 100.0 fL   MCH 33.6 26.0 - 34.0 pg   MCHC 32.0 30.0 - 36.0 g/dL   RDW 16.6 (H) 11.5 - 15.5 %   Platelets 97 (L) 150 - 400 K/uL    Comment: PLATELET COUNT CONFIRMED BY SMEAR  Heparin level (unfractionated)     Status: None   Collection Time: 12/20/13  5:26 AM  Result Value Ref Range   Heparin Unfractionated 0.37 0.30 - 0.70 IU/mL    Comment:        IF HEPARIN RESULTS ARE BELOW EXPECTED VALUES, AND PATIENT DOSAGE HAS BEEN CONFIRMED, SUGGEST FOLLOW UP TESTING OF ANTITHROMBIN  III LEVELS.   Protime-INR     Status: Abnormal   Collection Time: 12/20/13  5:26 AM   Result Value Ref Range   Prothrombin Time 25.3 (H) 11.6 - 15.2 seconds   INR 2.28 (H) 0.00 - 1.49  Glucose, capillary     Status: Abnormal   Collection Time: 12/20/13  6:27 AM  Result Value Ref Range   Glucose-Capillary 127 (H) 70 - 99 mg/dL   Comment 1 Documented in Chart    Comment 2 Notify RN   Glucose, capillary     Status: Abnormal   Collection Time: 12/20/13  1:00 PM  Result Value Ref Range   Glucose-Capillary 100 (H) 70 - 99 mg/dL   Comment 1 Notify RN    Comment 2 Documented in Chart   Glucose, capillary     Status: Abnormal   Collection Time: 12/20/13  4:51 PM  Result Value Ref Range   Glucose-Capillary 153 (H) 70 - 99 mg/dL  Glucose, capillary     Status: Abnormal   Collection Time: 12/20/13  9:25 PM  Result Value Ref Range   Glucose-Capillary 148 (H) 70 - 99 mg/dL   Comment 1 Notify RN   CBC     Status: Abnormal   Collection Time: 12/21/13  4:20 AM  Result Value Ref Range   WBC 4.8 4.0 - 10.5 K/uL   RBC 3.06 (L) 3.87 - 5.11 MIL/uL   Hemoglobin 10.0 (L) 12.0 - 15.0 g/dL   HCT 31.2 (L) 36.0 - 46.0 %   MCV 102.0 (H) 78.0 - 100.0 fL   MCH 32.7 26.0 - 34.0 pg   MCHC 32.1 30.0 - 36.0 g/dL   RDW 16.7 (H) 11.5 - 15.5 %   Platelets 116 (L) 150 - 400 K/uL    Comment: CONSISTENT WITH PREVIOUS RESULT  Protime-INR     Status: Abnormal   Collection Time: 12/21/13  4:20 AM  Result Value Ref Range   Prothrombin Time 29.6 (H) 11.6 - 15.2 seconds   INR 2.79 (H) 0.00 - 1.49  Heparin level (unfractionated)     Status: None   Collection Time: 12/21/13  4:20 AM  Result Value Ref Range   Heparin Unfractionated 0.35 0.30 - 0.70 IU/mL    Comment:        IF HEPARIN RESULTS ARE BELOW EXPECTED VALUES, AND PATIENT DOSAGE HAS BEEN CONFIRMED, SUGGEST FOLLOW UP TESTING OF ANTITHROMBIN III LEVELS.   Glucose, capillary     Status: Abnormal   Collection Time: 12/21/13  6:19 AM  Result Value Ref Range   Glucose-Capillary 102 (H) 70 - 99 mg/dL    Brief history of present  illness  The patient is a 58 year old female with past medical history for CAD s/p LIMA to LAD, aortic stenosis s/p AVR in 2015, hepatitis C s/p liver transplant in 2011, [place], ESRD on HD, presented with chest pain episodes occurring during hemodialysis. EKG showed new T-wave inversions in the inferior leads. Cardiology was consulted and patient was started on heparin and nitroglycerin drip.  Cardiac cath done on 11/6 showed focal 70% mid LAD stenosis, patent LIMA to mid LAD, normal right coronary artery. Patient had an extended hospital stay due to the fact that she required to be on heparin drip along with Coumadin, awaiting INR to be therapeutic,as recommended by cardiology  Assessment/Plan: Chest pain and cardiac markers were negative Due to the patient's history of CAD- status postcardiac catheterization that showed 70% mid LAD with patent LIMA to LAD, otherwise nonobstructive  disease. Plan is for medical therapy. Follow up with Dr. [name] as already arranged on 12/28/13.    Aortic stenosis, moderate-severe at cath Jan 2015. s/p Mechanical AVR 03/2013 Coumadin was held for heart catheterization, started on heparin bridge. INR 1.77. Patient's INR therapeutic 1 days in the setting of mechanical aVR Patient to continue with Coumadin 4 mg a day Next INR check 11/13 Follow up arranged in [facility] office. On 11/18, may have a repeat INR then goal INR 2.5-3.5   End stage renal disease on dialysis-TTS - renal follow the patient during this hospitalization Dialysis to  be continued as previously scheduled   Dyslipidemia-LDL 104, not on statin with Hx of liver transplant   DM type 2 (diabetes mellitus, type 2) -patient initiated on inpatient sliding scale insulin, continue Lantus at home - CBGs controlled  DVT Prophylaxis:heparin drip + Coumadin  Code Status:full code  Family Communication:  [place] home  today  Consultants:  Cardiology  renal  Procedures:  Cardiac catheterization  Antibiotics:  none    Discharge Exam:    Blood pressure 122/40, pulse 66, temperature 98 F (36.7 C), temperature source Oral, resp. rate 18, height 5\' 4"  (1.626 m), weight 77.7 kg (171 lb 4.8 oz), SpO2 98 %.  General:Pleasant affect, NAD Skin:Warm and dry, brisk capillary refill HEENT:normocephalic, sclera clear, mucus membranes moist Neck:supple, no JVD, no bruits  Heart:S1S2 RRR without murmur but crisp closure of valve, no gallup, rub or click Lungs:clear ant.without rales, rhonchi, or wheezes SLH:TDSK, non tender, + BS, do not palpate liver spleen or masses Ext:no lower ext edema, 2+ pedal pulses, 2+ radial pulses Neuro:alert and oriented X 3, MAE, follows commands, + facial symmetry        Discharge Instructions    Diet - low sodium heart healthy    Complete by:  As directed      Increase activity slowly    Complete by:  As directed            Follow-up Information    Follow up with Charlynn Court, NP.   Specialty:  Nurse Practitioner   Contact information:   [place] [address] [phone]       Follow up with Troy Sine, MD. Call on 12/28/2013.   Specialty:  Cardiology   Why:  to confirm appointment   Contact information:   [address] [place] [address] [phone]       Signed: Reyne Dumas 12/21/2013, 11:17 AM

## 2013-12-21 NOTE — Progress Notes (Signed)
ANTICOAGULATION CONSULT NOTE - Follow Up Consult  Pharmacy Consult: Coumadin Indication: mechanical heart valve  Allergies  Allergen Reactions  . Acetaminophen Other (See Comments)    Liver transplant recipient   . Codeine Itching  . Mirtazapine Other (See Comments)    hallucination  . Morphine Itching    Patient Measurements: Height: 5\' 4"  (162.6 cm) Weight: 171 lb 4.8 oz (77.7 kg) (Standing) IBW/kg (Calculated) : 54.7 Heparin Dosing Weight: 70 kg  Vital Signs: Temp: 98 F (36.7 C) (11/11 0511) Temp Source: Oral (11/11 0511) BP: 122/40 mmHg (11/11 0511) Pulse Rate: 66 (11/11 0511)  Labs:  Recent Labs  12/19/13 0425 12/20/13 0526 12/21/13 0420  HGB 11.2* 10.7* 10.0*  HCT 35.3* 33.4* 31.2*  PLT 121* 97* 116*  LABPROT 20.8* 25.3* 29.6*  INR 1.77* 2.28* 2.79*  HEPARINUNFRC 0.51 0.37 0.35  CREATININE 7.96*  --   --     Estimated Creatinine Clearance: 7.7 mL/min (by C-G formula based on Cr of 7.96).   Assessment: 58 yo female with mechanical AVR is s/p cath and is currently on heparin bridging to Coumadin.   INR now therapeutic at 2.79  Goal of Therapy:  Heparin level 0.3-0.7 units/ml  INR 2.5-3 Monitor platelets by anticoagulation protocol: Yes   Plan:  Try Coumadin 4 mg po daily at discharge Heparin stopped Follow up INR next week  Thank you Anette Guarneri, PharmD [phone]  12/21/2013, 10:44 AM

## 2013-12-28 ENCOUNTER — Ambulatory Visit (INDEPENDENT_AMBULATORY_CARE_PROVIDER_SITE_OTHER): Payer: Medicare Other | Admitting: Pharmacist Clinician (PhC)/ Clinical Pharmacy Specialist

## 2013-12-28 ENCOUNTER — Ambulatory Visit (INDEPENDENT_AMBULATORY_CARE_PROVIDER_SITE_OTHER): Payer: Medicare Other | Admitting: Cardiovascular Disease

## 2013-12-28 ENCOUNTER — Encounter: Payer: Self-pay | Admitting: Cardiovascular Disease

## 2013-12-28 VITALS — BP 140/70 | HR 68 | Ht 64.0 in | Wt 173.7 lb

## 2013-12-28 DIAGNOSIS — I25708 Atherosclerosis of coronary artery bypass graft(s), unspecified, with other forms of angina pectoris: Secondary | ICD-10-CM | POA: Diagnosis not present

## 2013-12-28 DIAGNOSIS — Z79899 Other long term (current) drug therapy: Secondary | ICD-10-CM | POA: Diagnosis not present

## 2013-12-28 DIAGNOSIS — Z954 Presence of other heart-valve replacement: Secondary | ICD-10-CM

## 2013-12-28 DIAGNOSIS — G4730 Sleep apnea, unspecified: Secondary | ICD-10-CM

## 2013-12-28 DIAGNOSIS — Z952 Presence of prosthetic heart valve: Secondary | ICD-10-CM

## 2013-12-28 DIAGNOSIS — I350 Nonrheumatic aortic (valve) stenosis: Secondary | ICD-10-CM | POA: Diagnosis not present

## 2013-12-28 DIAGNOSIS — Z7901 Long term (current) use of anticoagulants: Secondary | ICD-10-CM | POA: Diagnosis not present

## 2013-12-28 DIAGNOSIS — Z944 Liver transplant status: Secondary | ICD-10-CM | POA: Diagnosis not present

## 2013-12-28 DIAGNOSIS — R0681 Apnea, not elsewhere classified: Secondary | ICD-10-CM

## 2013-12-28 DIAGNOSIS — E039 Hypothyroidism, unspecified: Secondary | ICD-10-CM

## 2013-12-28 DIAGNOSIS — I2510 Atherosclerotic heart disease of native coronary artery without angina pectoris: Secondary | ICD-10-CM

## 2013-12-28 LAB — T3, FREE: T3 FREE: 1.5 pg/mL — AB (ref 2.3–4.2)

## 2013-12-28 LAB — TSH: TSH: 1.096 u[IU]/mL (ref 0.350–4.500)

## 2013-12-28 LAB — T4, FREE: Free T4: 1.27 ng/dL (ref 0.80–1.80)

## 2013-12-28 LAB — POCT INR: INR: 5

## 2013-12-28 MED ORDER — METOPROLOL TARTRATE 50 MG PO TABS: ORAL_TABLET | ORAL | Status: DC

## 2013-12-28 NOTE — Patient Instructions (Addendum)
Your physician has recommended you make the following change in your medication: the lopressor has been changed to 1 & 1/2 tablet in the morning and 1 tablet in the evening on NON DIALYSIS DAYS.  1 tablet twice a day on DIALYSIS days.  Your physician recommends that you return for lab work . You will not need to fast for the bloodwork.  Your physician has recommended that you have a sleep study. This test records several body functions during sleep, including: brain activity, eye movement, oxygen and carbon dioxide blood levels, heart rate and rhythm, breathing rate and rhythm, the flow of air through your mouth and nose, snoring, body muscle movements, and chest and belly movement. This will be done at [facility].    Your physician recommends that you schedule a follow-up appointment in: 4 months or sooner if needed with Dr. [name].

## 2013-12-28 NOTE — Progress Notes (Signed)
Patient ID: [name], female   DOB: Mar 23, 1955, 58 y.o.   MRN: [id number]    HPI: [name] is a 58 y.o. African American female  female who presents to the office today for a 6 monthfollow up cardiology evaluation.  [name] has end-stage renal disease, who has been on chronic hemodialysis.  After undergoing liver transplantation at [facility] for hepatitis C  She was admitted to [facility] in January with increasing chest pain.  A dobutamine echo study suggested severe AS with an ejection fraction of 45%.  She ultimately underwent cardiac catheterization by me which revealed mild CAD with 50% narrowing in moderately severe aortic stenosis with aortic valve area in the range of 0.8-1.2.  She ultimately underwent aortic valve replacement with mechanical aortic valve and single vessel LIMA to LAD CABG revascularization surgery on 03/15/2013 by Dr. [name].  Following discharge, she presented to the hospital for further evaluation of chest pain and palpitations.  Troponins were negative.  Beta blocker therapy was increased to following discharge.   She undergoes dialysis on Tuesday, Thursdays, and Saturdays.  Since I last saw her, she was again hospitalized from November 5 through 12/21/2013, she presented with chest pain episodes which occurred during dialysis.  Her ECG showed new T-wave inversion in inferior leads.  He underwent a repeat cardiac catheterization on November 6 by me which showed a focal 70% mid LAD stenosis but the LIMA graft to the mid LAD was patent.  Chest normal RCA.  Medical therapy was recommended.  Costophrenic for aortic valve.  She was re-coumadinized prior to discharge.  Since hospital discharge.  She denies recurrent chest pain.  Her sleep is poor.  According to family members.  They have witnessed apnea.  She presents for evaluation.    Past Medical History  Diagnosis Date  . 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 Chronic back pain   . CAD (coronary artery disease)   . Obesity   . Peripheral vascular disease hands and legs  . Anxiety   . Asthma   . GERD (gastroesophageal reflux disease)     takes Omeprazole daily  . Chronic constipation     takes MIralax and Colace daily  . Anemia     takes Folic Acid daily  . Hypothyroidism     takes Synthroid daily  . Depression     takes Cymbalta for "severe" depression  . Neuromuscular disorder     carpal tunnel in right hand  . Hypertension     takes Metoprolol and Lisinopril daily, sees Dr Bea Graff  . COPD (chronic obstructive pulmonary disease)   . Pneumonia     "today and several times before" (08/30/2012)  . Chronic bronchitis     "q yr w/season changes" (08/30/2012)  . Type II diabetes mellitus     Levemir 2units daily if > 150  . History of blood transfusion     "several" (08/30/2012)  . Hepatitis C   . Migraine     "last migraine was in 2013" (08/30/2012)  . Headache     "at least monthly" (08/30/2012)  . Arthritis     "left hand, back" (08/30/2012)  . End stage renal disease on dialysis 02/27/2011    Kidneys shut down at time of liver transplant in Sept 2011 at [facility] in [place], she has been on HD ever  since.  Dialyzes at [facility] HD on TTS schedule.  Had L forearm graft used 10 months then removed Dec 2012 due to suspected infection.  A right upper arm AVG was placed Dec 2012 but she developed steal symptoms acutely and it was ligated the same day.  Never had an AV fistula due to small veins.  Now has L thigh AVG put in Jan 2013, has not clotted to date.    [name] CAD (coronary artery disease) Jan. 2015    Cath: 20% LAD, 50% D1; s/p LIMA-LAD  . Aortic stenosis   . Tobacco abuse   . S/P aortic valve replacement 03/15/13    Mechanical     Past Surgical History  Procedure Laterality Date  . Liver transplant  10/25/2009    sees Dr Ferol Luz 1 every 6 months, saw last in Dec 2013. [name] Coord  [phone]  . Small intestine surgery  90's  . Thrombectomy    . Arteriovenous graft placement Left 10/03/10     forearm  . Avgg removal  12/23/2010    Procedure: REMOVAL OF ARTERIOVENOUS GORETEX GRAFT ([place]);  Surgeon: Elam Dutch, MD;  Location: [place];  Service: Vascular;  Laterality: Left;  procedure started @1736 -1852  . Insertion of dialysis catheter  12/23/2010    Procedure: INSERTION OF DIALYSIS CATHETER;  Surgeon: Elam Dutch, MD;  Location: [place];  Service: Vascular;  Laterality: Right;  Right Internal Jugular 28cm dialysis catheter insertion procedure time 1701-1720   . Cholecystectomy  1993  . Cystoscopy  1990's  . Spinal growth rods  2010    "put 2 metal rods in my back; they had detetriorated" (08/30/2012)  . Av fistula placement  01/29/2011    Procedure: INSERTION OF ARTERIOVENOUS (AV) GORE-TEX GRAFT ARM;  Surgeon: Elam Dutch, MD;  Location: [place];  Service: Vascular;  Laterality: Right;  . Av fistula placement  03/10/2011    Procedure: INSERTION OF ARTERIOVENOUS (AV) GORE-TEX GRAFT THIGH;  Surgeon: Elam Dutch, MD;  Location: [place];  Service: Vascular;  Laterality: Left;  . Tubal ligation  1990's  . Cardiac catheterization  2014  . Aortic valve replacement N/A 03/15/2013    AVR; Surgeon: Ivin Poot, MD;  Location: [facility] OR; Open Heart Surgery;  38mm[employer] mechanical prosthesis, top hat valve  . Coronary artery bypass graft N/A 03/15/2013    Procedure: CORONARY ARTERY BYPASS GRAFTING (CABG) times one using left internal mammary artery.;  Surgeon: Ivin Poot, MD;  Location: [place];  Service: Open Heart Surgery;  Laterality: N/A;  POSS CABG X 1  . Intraoperative transesophageal echocardiogram N/A 03/15/2013    Procedure: INTRAOPERATIVE TRANSESOPHAGEAL ECHOCARDIOGRAM;  Surgeon: Ivin Poot, MD;  Location: [place];  Service: Open Heart Surgery;  Laterality: N/A;    Allergies  Allergen Reactions  . Acetaminophen Other (See Comments)    Liver  transplant recipient   . Codeine Itching  . Mirtazapine Other (See Comments)    hallucination  . Morphine Itching    Current Outpatient Prescriptions  Medication Sig Dispense Refill  . albuterol (PROVENTIL,VENTOLIN) 90 MCG/ACT inhaler Inhale 1-2 puffs into the lungs every 6 (six) hours as needed for wheezing or shortness of breath.     [name] aspirin EC 81 MG tablet Take 81 mg by mouth daily.      . calcium carbonate (TUMS - DOSED IN MG ELEMENTAL CALCIUM) 500 MG chewable tablet Chew 1 tablet by mouth daily.    . cinacalcet (SENSIPAR) 30 MG tablet  Take 30 mg by mouth daily.    [name] gabapentin (NEURONTIN) 300 MG capsule Take 300 mg by mouth at bedtime.     . insulin detemir (LEVEMIR) 100 UNIT/ML injection Inject 2-5 Units into the skin at bedtime. 150-200 do 2 units >200 do 5 units    . levothyroxine (SYNTHROID, LEVOTHROID) 150 MCG tablet Take 150 mcg by mouth daily before breakfast.    . LORazepam (ATIVAN) 0.5 MG tablet Take 0.5 mg by mouth at bedtime as needed for sleep.     . metoprolol (LOPRESSOR) 50 MG tablet Take 50 mg by mouth 2 (two) times daily.    . Multiple Vitamin (MULTIVITAMIN WITH MINERALS) TABS tablet Take 1 tablet by mouth at bedtime.     . ondansetron (ZOFRAN-ODT) 4 MG disintegrating tablet Take 4 mg by mouth every 8 (eight) hours as needed for nausea or vomiting.    . polyethylene glycol (MIRALAX / GLYCOLAX) packet Take 17 g by mouth as needed.    . polyethylene glycol-electrolytes (NULYTELY/GOLYTELY) 420 G solution     . promethazine (PHENERGAN) 12.5 MG tablet Take 12.5 mg by mouth every 4 (four) hours as needed for nausea.     [name] RENVELA 2.4 G PACK     . tacrolimus (PROGRAF) 1 MG capsule Take 3 mg by mouth 2 (two) times daily.     . traMADol (ULTRAM) 50 MG tablet Take 50 mg by mouth every 12 (twelve) hours as needed for moderate pain.     [name] VELPHORO 500 MG chewable tablet Chew 1 tablet by mouth 3 (three) times daily with meals.     . warfarin (COUMADIN) 4 MG tablet Take 1 tablet  (4 mg total) by mouth daily at 6 PM. 30 tablet 0   No current facility-administered medications for this visit.    History   Social History  . Marital Status: Divorced    Spouse Name: N/A    Number of Children: N/A  . Years of Education: N/A   Occupational History  . Not on file.   Social History Main Topics  . Smoking status: Former Smoker -- 0.75 packs/day for 40 years    Types: Cigarettes    Quit date: 03/13/2013  . Smokeless tobacco: Never Used     Comment: still smokng 1 or 2 a day  . Alcohol Use: Yes     Comment: 08/30/2012 "last drink was at a wedding July, 2014; had a small glass of wine; never had problems w/alcohol"  . Drug Use: No  . Sexual Activity: Not Currently   Other Topics Concern  . Not on file   Social History Narrative    Family History  Problem Relation Age of Onset  . Cancer Mother   . Diabetes Mother   . Hypertension Mother   . Stroke Mother   . Cancer Father   . Anesthesia problems Neg Hx   . Hypotension Neg Hx   . Malignant hyperthermia Neg Hx   . Pseudochol deficiency Neg Hx     ROS General: Negative; No fevers, chills, or night sweats HEENT: Negative; No changes in vision or hearing, sinus congestion, difficulty swallowing Pulmonary: Negative; No cough, wheezing, shortness of breath, hemoptysis Cardiovascular: See history of present illness; No chest pain, presyncope, syncope, palpatations GI: Negative; No nausea, vomiting, diarrhea, or abdominal pain GU: Negative; No dysuria, hematuria, or difficulty voiding Musculoskeletal: Negative; no myalgias, joint pain, or weakness Hematologic: Negative; no easy bruising, bleeding Endocrine: Positive for hypothyroidism; no heat/cold intolerance; no diabetes, Neuro:  Perform neuropathy; no changes in balance, headaches Skin: Negative; No rashes or skin lesions Psychiatric: Negative; No behavioral problems, depression Sleep: positive for witnessed apnea No snoring,  daytime sleepiness,  hypersomnolence, bruxism, restless legs, hypnogognic hallucinations. Other comprehensive 14 point system review is negative    Physical Exam BP 140/70 mmHg  Pulse 68  Ht 5\' 4"  (1.626 m)  Wt 173 lb 11.2 oz (78.79 kg)  BMI 29.80 kg/m2 General: Alert, oriented, no distress.  Skin: normal turgor, no rashes, warm and dry HEENT: Normocephalic, atraumatic. Pupils equal round and reactive to light; sclera anicteric; extraocular muscles intact, No lid lag; Nose without nasal septal hypertrophy; Mouth/Parynx benign; Mallinpatti scale 3 Neck: No JVD, no carotid bruits; normal carotid upstroke Lungs: clear to ausculatation and percussion bilaterally; no wheezing or rales, normal inspiratory and expiratory effort Chest wall: without tenderness to palpitation Heart: PMI not displaced, RRR, s1 s2 normal, 2/6 systolic murmur, crisp mechanical heart sounds; No diastolic murmur, no rubs, gallops, thrills, or heaves Abdomen: soft, nontender; no hepatosplenomehaly, BS+; abdominal aorta nontender and not dilated by palpation. Back: no CVA tenderness Pulses: 2+  Musculoskeletal: full range of motion, normal strength, no joint deformities Extremities: Pulses 2+, no clubbing cyanosis or edema, Homan's sign negative  Neurologic: grossly nonfocal; Cranial nerves grossly wnl Psychologic: Normal mood and affect  ECG (independently read by me): Normal sinus rhythm.  Borderline first-degree AV block with a PR interval of 202.  T abnormalities in leads 1 and L2 and V6.  LVH by voltage.  May 2015 ECG (independently read by me): Normal sinus rhythm at 72 beats per minute.  T wave changes in leads 1 and L. and V6.  QTC 486 ms.  LABS:  BMET    Component Value Date/Time   NA 136* 12/19/2013 0425   K 5.6* 12/19/2013 0425   CL 96 12/19/2013 0425   CO2 22 12/19/2013 0425   GLUCOSE 119* 12/19/2013 0425   BUN 47* 12/19/2013 0425   CREATININE 7.96* 12/19/2013 0425   CALCIUM 7.9* 12/19/2013 0425   GFRNONAA 5*  12/19/2013 0425   GFRAA 6* 12/19/2013 0425     Hepatic Function Panel     Component Value Date/Time   PROT 6.6 12/17/2013 0830   ALBUMIN 2.8* 12/17/2013 0830   AST 46* 12/17/2013 0830   ALT 33 12/17/2013 0830   ALKPHOS 91 12/17/2013 0830   BILITOT 0.3 12/17/2013 0830   BILIDIR <0.2 03/16/2013 1500   IBILI NOT CALCULATED 03/16/2013 1500     CBC    Component Value Date/Time   WBC 4.8 12/21/2013 0420   RBC 3.06* 12/21/2013 0420   HGB 10.0* 12/21/2013 0420   HCT 31.2* 12/21/2013 0420   PLT 116* 12/21/2013 0420   MCV 102.0* 12/21/2013 0420   MCH 32.7 12/21/2013 0420   MCHC 32.1 12/21/2013 0420   RDW 16.7* 12/21/2013 0420   LYMPHSABS 1.2 11/03/2013 1205   MONOABS 0.6 11/03/2013 1205   EOSABS 0.1 11/03/2013 1205   BASOSABS 0.0 11/03/2013 1205     BNP    Component Value Date/Time   PROBNP 32077.0* 09/01/2012 0555    Lipid Panel     Component Value Date/Time   CHOL 225* 12/22/2011 1030   TRIG 121 12/22/2011 1030   HDL 97 12/22/2011 1030   CHOLHDL 2.3 12/22/2011 1030   VLDL 24 12/22/2011 1030   LDLCALC 104* 12/22/2011 1030     RADIOLOGY: Dg Chest 2 View  06/01/2013   CLINICAL DATA:  History of aortic valve replacement  in February, follow-up  EXAM: CHEST  2 VIEW  COMPARISON:  Chest x-ray 04/20/2013, and CT chest of 03/11/2013  FINDINGS: Cardiomegaly is stable. There is very mild pulmonary vascular congestion present. No effusion is seen. A nodular opacity in the medial left lung apex is stable and compared to the prior CT chest represents a bony density from the upper thoracic spine. Median sternotomy sutures remain and an aortic valve replacement is present. No acute bony abnormality is seen. Multiple surgical clips are present within the epigastrium.  IMPRESSION: 1. Stable cardiomegaly. 2. Mild pulmonary vascular congestion.   Electronically Signed   By: Ivar Drape M.D.   On: 06/01/2013 09:40      ASSESSMENT AND PLAN:  [name] underwent mechanical  aortic valve replacement for moderately severe symptomatic aortic stenosis and single-vessel LIMA to LAD CABG revascularization surgery in February 2015.  She had been readmitted with episodes of tachycardia and shortness of breath.  And recently has had some episodes of recurrent chest pain leading to her repeat cardiac catheterization.  I have recommended slight additional titration of her metoprolol from 50 mg twice a day to 75 mg in the morning and 50 mg at night.  On nondialysis days, but she will continue to take to 50 mg twice a day on nondialysis days.  Her INR today  is supratherapeutic at 5 and she was told to withhold her Coumadin today and tomorrow and reinstitute with a new schedule.  Upon questioning her sleep pattern, she typically goes to bed at 11 PM and wakes up at 5.  She has had some witnessed apneic spells.  I'm referring her for diagnostic polysomnogram.  I have personally reviewed her recent hospitalization records.  I'm also recommending follow-up thyroid function studies which were not done at the time.  I will see her back in the office in 3-4 months for follow-up evaluation.  Troy Sine, MD, [facility]  12/28/2013 10:55 AM

## 2013-12-29 DIAGNOSIS — I359 Nonrheumatic aortic valve disorder, unspecified: Secondary | ICD-10-CM | POA: Diagnosis not present

## 2013-12-31 ENCOUNTER — Encounter: Payer: Self-pay | Admitting: Cardiovascular Disease

## 2014-01-02 ENCOUNTER — Telehealth: Payer: Self-pay | Admitting: Cardiovascular Disease

## 2014-01-02 ENCOUNTER — Inpatient Hospital Stay (HOSPITAL_COMMUNITY)
Admission: AD | Admit: 2014-01-02 | Discharge: 2014-01-04 | DRG: 682 | Disposition: A | Discharge status: Home / Self Care | Payer: Medicare Other | Source: Other Acute Inpatient Hospital | Attending: Internal Medicine | Admitting: Internal Medicine

## 2014-01-02 ENCOUNTER — Inpatient Hospital Stay (HOSPITAL_COMMUNITY): Payer: Medicare Other

## 2014-01-02 DIAGNOSIS — Z992 Dependence on renal dialysis: Secondary | ICD-10-CM | POA: Diagnosis not present

## 2014-01-02 DIAGNOSIS — Z885 Allergy status to narcotic agent status: Secondary | ICD-10-CM

## 2014-01-02 DIAGNOSIS — E1129 Type 2 diabetes mellitus with other diabetic kidney complication: Secondary | ICD-10-CM | POA: Diagnosis not present

## 2014-01-02 DIAGNOSIS — Z8701 Personal history of pneumonia (recurrent): Secondary | ICD-10-CM

## 2014-01-02 DIAGNOSIS — I739 Peripheral vascular disease, unspecified: Secondary | ICD-10-CM | POA: Diagnosis present

## 2014-01-02 DIAGNOSIS — F419 Anxiety disorder, unspecified: Secondary | ICD-10-CM | POA: Diagnosis present

## 2014-01-02 DIAGNOSIS — J810 Acute pulmonary edema: Secondary | ICD-10-CM

## 2014-01-02 DIAGNOSIS — M479 Spondylosis, unspecified: Secondary | ICD-10-CM | POA: Diagnosis present

## 2014-01-02 DIAGNOSIS — I501 Left ventricular failure: Secondary | ICD-10-CM | POA: Diagnosis not present

## 2014-01-02 DIAGNOSIS — E8770 Fluid overload, unspecified: Secondary | ICD-10-CM | POA: Diagnosis not present

## 2014-01-02 DIAGNOSIS — J811 Chronic pulmonary edema: Secondary | ICD-10-CM

## 2014-01-02 DIAGNOSIS — Z87891 Personal history of nicotine dependence: Secondary | ICD-10-CM

## 2014-01-02 DIAGNOSIS — Z7982 Long term (current) use of aspirin: Secondary | ICD-10-CM

## 2014-01-02 DIAGNOSIS — E875 Hyperkalemia: Secondary | ICD-10-CM | POA: Diagnosis not present

## 2014-01-02 DIAGNOSIS — G43909 Migraine, unspecified, not intractable, without status migrainosus: Secondary | ICD-10-CM | POA: Diagnosis present

## 2014-01-02 DIAGNOSIS — K219 Gastro-esophageal reflux disease without esophagitis: Secondary | ICD-10-CM | POA: Diagnosis present

## 2014-01-02 DIAGNOSIS — Z951 Presence of aortocoronary bypass graft: Secondary | ICD-10-CM

## 2014-01-02 DIAGNOSIS — I10 Essential (primary) hypertension: Secondary | ICD-10-CM | POA: Diagnosis not present

## 2014-01-02 DIAGNOSIS — I120 Hypertensive chronic kidney disease with stage 5 chronic kidney disease or end stage renal disease: Principal | ICD-10-CM | POA: Diagnosis present

## 2014-01-02 DIAGNOSIS — H547 Unspecified visual loss: Secondary | ICD-10-CM | POA: Diagnosis present

## 2014-01-02 DIAGNOSIS — J9691 Respiratory failure, unspecified with hypoxia: Secondary | ICD-10-CM | POA: Diagnosis not present

## 2014-01-02 DIAGNOSIS — Z794 Long term (current) use of insulin: Secondary | ICD-10-CM

## 2014-01-02 DIAGNOSIS — Z886 Allergy status to analgesic agent status: Secondary | ICD-10-CM

## 2014-01-02 DIAGNOSIS — N186 End stage renal disease: Secondary | ICD-10-CM

## 2014-01-02 DIAGNOSIS — Z952 Presence of prosthetic heart valve: Secondary | ICD-10-CM | POA: Diagnosis not present

## 2014-01-02 DIAGNOSIS — J9690 Respiratory failure, unspecified, unspecified whether with hypoxia or hypercapnia: Secondary | ICD-10-CM | POA: Diagnosis present

## 2014-01-02 DIAGNOSIS — E780 Pure hypercholesterolemia: Secondary | ICD-10-CM | POA: Diagnosis not present

## 2014-01-02 DIAGNOSIS — Z944 Liver transplant status: Secondary | ICD-10-CM | POA: Diagnosis not present

## 2014-01-02 DIAGNOSIS — E039 Hypothyroidism, unspecified: Secondary | ICD-10-CM | POA: Diagnosis present

## 2014-01-02 DIAGNOSIS — R791 Abnormal coagulation profile: Secondary | ICD-10-CM | POA: Diagnosis present

## 2014-01-02 DIAGNOSIS — M179 Osteoarthritis of knee, unspecified: Secondary | ICD-10-CM | POA: Diagnosis present

## 2014-01-02 DIAGNOSIS — J45909 Unspecified asthma, uncomplicated: Secondary | ICD-10-CM | POA: Diagnosis present

## 2014-01-02 DIAGNOSIS — D631 Anemia in chronic kidney disease: Secondary | ICD-10-CM | POA: Diagnosis present

## 2014-01-02 DIAGNOSIS — J9601 Acute respiratory failure with hypoxia: Secondary | ICD-10-CM | POA: Diagnosis present

## 2014-01-02 DIAGNOSIS — Z7901 Long term (current) use of anticoagulants: Secondary | ICD-10-CM

## 2014-01-02 DIAGNOSIS — R0602 Shortness of breath: Secondary | ICD-10-CM | POA: Diagnosis not present

## 2014-01-02 DIAGNOSIS — I959 Hypotension, unspecified: Secondary | ICD-10-CM | POA: Diagnosis present

## 2014-01-02 DIAGNOSIS — M1990 Unspecified osteoarthritis, unspecified site: Secondary | ICD-10-CM | POA: Diagnosis present

## 2014-01-02 DIAGNOSIS — I2510 Atherosclerotic heart disease of native coronary artery without angina pectoris: Secondary | ICD-10-CM | POA: Diagnosis present

## 2014-01-02 DIAGNOSIS — E669 Obesity, unspecified: Secondary | ICD-10-CM | POA: Diagnosis present

## 2014-01-02 DIAGNOSIS — J449 Chronic obstructive pulmonary disease, unspecified: Secondary | ICD-10-CM | POA: Diagnosis present

## 2014-01-02 DIAGNOSIS — I359 Nonrheumatic aortic valve disorder, unspecified: Secondary | ICD-10-CM | POA: Diagnosis not present

## 2014-01-02 DIAGNOSIS — F329 Major depressive disorder, single episode, unspecified: Secondary | ICD-10-CM | POA: Diagnosis present

## 2014-01-02 DIAGNOSIS — E872 Acidosis: Secondary | ICD-10-CM | POA: Diagnosis not present

## 2014-01-02 DIAGNOSIS — N2581 Secondary hyperparathyroidism of renal origin: Secondary | ICD-10-CM | POA: Diagnosis present

## 2014-01-02 DIAGNOSIS — R509 Fever, unspecified: Secondary | ICD-10-CM | POA: Diagnosis present

## 2014-01-02 DIAGNOSIS — I509 Heart failure, unspecified: Secondary | ICD-10-CM | POA: Diagnosis not present

## 2014-01-02 DIAGNOSIS — J90 Pleural effusion, not elsewhere classified: Secondary | ICD-10-CM | POA: Diagnosis not present

## 2014-01-02 DIAGNOSIS — E119 Type 2 diabetes mellitus without complications: Secondary | ICD-10-CM | POA: Diagnosis present

## 2014-01-02 DIAGNOSIS — R069 Unspecified abnormalities of breathing: Secondary | ICD-10-CM | POA: Diagnosis not present

## 2014-01-02 LAB — PROTIME-INR
INR: 2.39 — ABNORMAL HIGH (ref 0.00–1.49)
Prothrombin Time: 26.3 seconds — ABNORMAL HIGH (ref 11.6–15.2)

## 2014-01-02 LAB — GLUCOSE, CAPILLARY: GLUCOSE-CAPILLARY: 107 mg/dL — AB (ref 70–99)

## 2014-01-02 MED ORDER — INSULIN DETEMIR 100 UNIT/ML ~~LOC~~ SOLN: 2.0000 [IU] | Freq: Every day | SUBCUTANEOUS | Status: DC

## 2014-01-02 MED ORDER — CINACALCET HCL 30 MG PO TABS
30.0000 mg | ORAL_TABLET | Freq: Every day | ORAL | Status: DC
Start: 2014-01-03 — End: 2014-01-04
  Administered 2014-01-03 – 2014-01-04 (×2): 30 mg via ORAL
  Filled 2014-01-02 (×3): qty 1

## 2014-01-02 MED ORDER — NITROGLYCERIN IN D5W 200-5 MCG/ML-% IV SOLN
0.0000 ug/min | INTRAVENOUS | Status: DC
  Administered 2014-01-02: 10 ug/min via INTRAVENOUS

## 2014-01-02 MED ORDER — LEVOTHYROXINE SODIUM 150 MCG PO TABS
150.0000 ug | ORAL_TABLET | Freq: Every day | ORAL | Status: DC
  Administered 2014-01-03 – 2014-01-04 (×2): 150 ug via ORAL
  Filled 2014-01-02 (×3): qty 1

## 2014-01-02 MED ORDER — SUCROFERRIC OXYHYDROXIDE 500 MG PO CHEW
500.0000 mg | CHEWABLE_TABLET | Freq: Three times a day (TID) | ORAL | Status: DC
  Administered 2014-01-03 – 2014-01-04 (×4): 500 mg via ORAL
  Filled 2014-01-02 (×7): qty 1

## 2014-01-02 MED ORDER — WARFARIN SODIUM 4 MG PO TABS
4.0000 mg | ORAL_TABLET | Freq: Every day | ORAL | Status: DC
  Filled 2014-01-02: qty 1

## 2014-01-02 MED ORDER — RENA-VITE PO TABS
1.0000 | ORAL_TABLET | Freq: Every day | ORAL | Status: DC
  Administered 2014-01-02 – 2014-01-03 (×2): 1 via ORAL
  Filled 2014-01-02 (×3): qty 1

## 2014-01-02 MED ORDER — WARFARIN - PHYSICIAN DOSING INPATIENT
Freq: Every day | Status: DC
  Administered 2014-01-03: 23:00:00

## 2014-01-02 MED ORDER — CHLORHEXIDINE GLUCONATE 0.12 % MT SOLN: 15.0000 mL | Freq: Two times a day (BID) | OROMUCOSAL | Status: DC

## 2014-01-02 MED ORDER — ALBUTEROL SULFATE (2.5 MG/3ML) 0.083% IN NEBU: 2.5000 mg | INHALATION_SOLUTION | RESPIRATORY_TRACT | Status: DC | PRN

## 2014-01-02 MED ORDER — CETYLPYRIDINIUM CHLORIDE 0.05 % MT LIQD: 7.0000 mL | Freq: Two times a day (BID) | OROMUCOSAL | Status: DC

## 2014-01-02 MED ORDER — ONDANSETRON 4 MG PO TBDP
4.0000 mg | ORAL_TABLET | Freq: Three times a day (TID) | ORAL | Status: DC | PRN
  Filled 2014-01-02: qty 1

## 2014-01-02 MED ORDER — TACROLIMUS 1 MG PO CAPS
3.0000 mg | ORAL_CAPSULE | Freq: Two times a day (BID) | ORAL | Status: DC
  Administered 2014-01-02 – 2014-01-04 (×4): 3 mg via ORAL
  Filled 2014-01-02 (×5): qty 3

## 2014-01-02 MED ORDER — PANTOPRAZOLE SODIUM 40 MG IV SOLR
40.0000 mg | INTRAVENOUS | Status: DC
  Administered 2014-01-02: 40 mg via INTRAVENOUS
  Filled 2014-01-02 (×2): qty 40

## 2014-01-02 MED ORDER — SODIUM CHLORIDE 0.9 % IV SOLN: 250.0000 mL | INTRAVENOUS | Status: DC | PRN

## 2014-01-02 MED ORDER — TRAMADOL HCL 50 MG PO TABS
50.0000 mg | ORAL_TABLET | Freq: Two times a day (BID) | ORAL | Status: DC | PRN
  Administered 2014-01-03: 50 mg via ORAL
  Filled 2014-01-02: qty 1

## 2014-01-02 MED ORDER — SODIUM CHLORIDE 0.9 % IJ SOLN: 3.0000 mL | INTRAMUSCULAR | Status: DC | PRN

## 2014-01-02 MED ORDER — SODIUM CHLORIDE 0.9 % IJ SOLN
3.0000 mL | Freq: Two times a day (BID) | INTRAMUSCULAR | Status: DC
  Administered 2014-01-02 – 2014-01-04 (×4): 3 mL via INTRAVENOUS

## 2014-01-02 MED ORDER — SEVELAMER CARBONATE 2.4 G PO PACK
2.4000 g | PACK | Freq: Two times a day (BID) | ORAL | Status: DC
  Administered 2014-01-03 – 2014-01-04 (×3): 2.4 g via ORAL
  Filled 2014-01-02 (×5): qty 1

## 2014-01-02 MED ORDER — METOPROLOL TARTRATE 50 MG PO TABS
50.0000 mg | ORAL_TABLET | Freq: Two times a day (BID) | ORAL | Status: DC
  Administered 2014-01-02: 50 mg via ORAL
  Filled 2014-01-02 (×3): qty 1

## 2014-01-02 MED ORDER — ASPIRIN EC 81 MG PO TBEC
81.0000 mg | DELAYED_RELEASE_TABLET | Freq: Every day | ORAL | Status: DC
  Administered 2014-01-03 – 2014-01-04 (×2): 81 mg via ORAL
  Filled 2014-01-02 (×2): qty 1

## 2014-01-02 NOTE — Consult Note (Signed)
Reason for Consult:CHF, ^ K  Referring Physician: CCM  [name] is an 58 y.o. female.  HPI: 58 yr old female on HD since 2011.  Hx liver TX 2011, hx CABG and AVR in 2015, hx COPD and tobacco abuse, Hypothyroid, HTN, GERD, anemia, ^ Lipids who was in earlier this month with CAD and Cathed, Tx medically.  This am progressive SOB, and 2 episodes of N,V.  Went to [employer] and was on BIPAP, then just O2.  V X1 there.  K was 6.5.  No labs here.  No HD since Sat, and admits xs fluid on weekend.  Did not have full tx on Sat, low BPs.   Has signif abdm pain today that is persisting.  BSs have been in low 100s.  Chills, no fevers, and no D.  Dry cough.  Constitutional: as above Eyes: poor vision, reads only with glasses up close Ears, nose, mouth, throat, and face: negative Respiratory: as above Cardiovascular: no edema, no CP Gastrointestinal: as above Genitourinary:negative Integument/breast: negative Hematologic/lymphatic: anemia Musculoskeletal:DJD knees and back Neurological: negative Endocrine: as above, recent thyroid dose change Allergic/Immunologic: codeine, MS, Remeron,   Dialyzes at [facility] on TTS since 2011 . Primary Nephrologist Patel. EDW unknown. HD [place] 2Ca, @K , Dialyzer 180, Heparin strd. Access L groing avg.  Past Medical History  Diagnosis Date  . 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 Chronic back pain   . CAD (coronary artery disease)   . Obesity   . Peripheral vascular disease hands and legs  . Anxiety   . Asthma   . GERD (gastroesophageal reflux disease)     takes Omeprazole daily  . Chronic constipation     takes MIralax and Colace daily  . Anemia     takes Folic Acid daily  . Hypothyroidism     takes Synthroid daily  . Depression     takes Cymbalta for "severe" depression  . Neuromuscular disorder     carpal tunnel in right hand  . Hypertension     takes Metoprolol and  Lisinopril daily, sees Dr Bea Graff  . COPD (chronic obstructive pulmonary disease)   . Pneumonia     "today and several times before" (08/30/2012)  . Chronic bronchitis     "q yr w/season changes" (08/30/2012)  . Type II diabetes mellitus     Levemir 2units daily if > 150  . History of blood transfusion     "several" (08/30/2012)  . Hepatitis C   . Migraine     "last migraine was in 2013" (08/30/2012)  . Headache     "at least monthly" (08/30/2012)  . Arthritis     "left hand, back" (08/30/2012)  . End stage renal disease on dialysis 02/27/2011    Kidneys shut down at time of liver transplant in Sept 2011 at [facility] in [place], she has been on HD ever since.  Dialyzes at [facility] on TTS schedule.  Had L forearm graft used 10 months then removed Dec 2012 due to suspected infection.  A right upper arm AVG was placed Dec 2012 but she developed steal symptoms acutely and it was ligated the same day.  Never had an AV fistula due to small veins.  Now has L thigh AVG put in Jan 2013, has not clotted to date.    [name] CAD (coronary artery disease) Jan. 2015  Cath: 20% LAD, 50% D1; s/p LIMA-LAD  . Aortic stenosis   . Tobacco abuse   . S/P aortic valve replacement 03/15/13    Mechanical     Past Surgical History  Procedure Laterality Date  . Liver transplant  10/25/2009    sees Dr Ferol Luz 1 every 6 months, saw last in Dec 2013. [name] Coord [phone]  . Small intestine surgery  90's  . Thrombectomy    . Arteriovenous graft placement Left 10/03/10     forearm  . Avgg removal  12/23/2010    Procedure: REMOVAL OF ARTERIOVENOUS GORETEX GRAFT ([place]);  Surgeon: Elam Dutch, MD;  Location: [place];  Service: Vascular;  Laterality: Left;  procedure started @1736 -1852  . Insertion of dialysis catheter  12/23/2010    Procedure: INSERTION OF DIALYSIS CATHETER;  Surgeon: Elam Dutch, MD;  Location: [place];  Service: Vascular;  Laterality: Right;  Right Internal Jugular 28cm dialysis catheter  insertion procedure time 1701-1720   . Cholecystectomy  1993  . Cystoscopy  1990's  . Spinal growth rods  2010    "put 2 metal rods in my back; they had detetriorated" (08/30/2012)  . Av fistula placement  01/29/2011    Procedure: INSERTION OF ARTERIOVENOUS (AV) GORE-TEX GRAFT ARM;  Surgeon: Elam Dutch, MD;  Location: [place];  Service: Vascular;  Laterality: Right;  . Av fistula placement  03/10/2011    Procedure: INSERTION OF ARTERIOVENOUS (AV) GORE-TEX GRAFT THIGH;  Surgeon: Elam Dutch, MD;  Location: [place];  Service: Vascular;  Laterality: Left;  . Tubal ligation  1990's  . Cardiac catheterization  2014  . Aortic valve replacement N/A 03/15/2013    AVR; Surgeon: Ivin Poot, MD;  Location: [facility] OR; Open Heart Surgery;  75mm[employer] mechanical prosthesis, top hat valve  . Coronary artery bypass graft N/A 03/15/2013    Procedure: CORONARY ARTERY BYPASS GRAFTING (CABG) times one using left internal mammary artery.;  Surgeon: Ivin Poot, MD;  Location: [place];  Service: Open Heart Surgery;  Laterality: N/A;  POSS CABG X 1  . Intraoperative transesophageal echocardiogram N/A 03/15/2013    Procedure: INTRAOPERATIVE TRANSESOPHAGEAL ECHOCARDIOGRAM;  Surgeon: Ivin Poot, MD;  Location: [place];  Service: Open Heart Surgery;  Laterality: N/A;    Family History  Problem Relation Age of Onset  . Cancer Mother   . Diabetes Mother   . Hypertension Mother   . Stroke Mother   . Cancer Father   . Anesthesia problems Neg Hx   . Hypotension Neg Hx   . Malignant hyperthermia Neg Hx   . Pseudochol deficiency Neg Hx     Social History:  reports that she quit smoking about 9 months ago. Her smoking use included Cigarettes. She has a 30 pack-year smoking history. She has never used smokeless tobacco. She reports that she drinks alcohol. She reports that she does not use illicit drugs.  Allergies:  Allergies  Allergen Reactions  . Acetaminophen Other (See Comments)    Liver transplant  recipient   . Codeine Itching  . Mirtazapine Other (See Comments)    hallucination  . Morphine Itching    Medications:  I have reviewed the patient's current medications. Prior to Admission:  Prescriptions prior to admission  Medication Sig Dispense Refill Last Dose  . albuterol (PROVENTIL,VENTOLIN) 90 MCG/ACT inhaler Inhale 1-2 puffs into the lungs every 6 (six) hours as needed for wheezing or shortness of breath.    Taking  . aspirin EC 81  MG tablet Take 81 mg by mouth daily.     Taking  . calcium carbonate (TUMS - DOSED IN MG ELEMENTAL CALCIUM) 500 MG chewable tablet Chew 1 tablet by mouth daily.   Taking  . cinacalcet (SENSIPAR) 30 MG tablet Take 30 mg by mouth daily.   Taking  . gabapentin (NEURONTIN) 300 MG capsule Take 300 mg by mouth at bedtime.    Taking  . insulin detemir (LEVEMIR) 100 UNIT/ML injection Inject 2-5 Units into the skin at bedtime. 150-200 do 2 units >200 do 5 units   Taking  . levothyroxine (SYNTHROID, LEVOTHROID) 150 MCG tablet Take 150 mcg by mouth daily before breakfast.   Taking  . LORazepam (ATIVAN) 0.5 MG tablet Take 0.5 mg by mouth at bedtime as needed for sleep.    Taking  . metoprolol (LOPRESSOR) 50 MG tablet On non dialysis days take 1 & 1/2 tablet in the morning and 1 tablet in the evening. On dialysis days take 1 tablet twice daily 180 tablet 3   . Multiple Vitamin (MULTIVITAMIN WITH MINERALS) TABS tablet Take 1 tablet by mouth at bedtime.    Taking  . ondansetron (ZOFRAN-ODT) 4 MG disintegrating tablet Take 4 mg by mouth every 8 (eight) hours as needed for nausea or vomiting.   Taking  . polyethylene glycol (MIRALAX / GLYCOLAX) packet Take 17 g by mouth as needed.   Taking  . polyethylene glycol-electrolytes (NULYTELY/GOLYTELY) 420 G solution    Taking  . promethazine (PHENERGAN) 12.5 MG tablet Take 12.5 mg by mouth every 4 (four) hours as needed for nausea.    Taking  . RENVELA 2.4 G PACK    Taking  . tacrolimus (PROGRAF) 1 MG capsule Take 3 mg by  mouth 2 (two) times daily.    Taking  . traMADol (ULTRAM) 50 MG tablet Take 50 mg by mouth every 12 (twelve) hours as needed for moderate pain.    Taking  . VELPHORO 500 MG chewable tablet Chew 1 tablet by mouth 3 (three) times daily with meals.    Taking  . warfarin (COUMADIN) 4 MG tablet Take 1 tablet (4 mg total) by mouth daily at 6 PM. 30 tablet 0 Taking  Calcitriol .5 mcgj po tiw. , Aranesp 81mcg iv q wk.   Results for orders placed or performed during the hospital encounter of 01/02/14 (from the past 48 hour(s))  Glucose, capillary     Status: Abnormal   Collection Time: 01/02/14  9:11 PM  Result Value Ref Range   Glucose-Capillary 107 (H) 70 - 99 mg/dL    No results found.  ROS Blood pressure 149/44, pulse 87, temperature 99.3 F (37.4 C), temperature source Oral, resp. rate 27, height 5\' 4"  (1.626 m), weight 80.6 kg (177 lb 11.1 oz), SpO2 98 %. Physical Exam Physical Examination: General appearance - chronically ill, no acute distress Mental status - alert, oriented to person, place, and time Eyes - pupils equal and reactive, extraocular eye movements intact Mouth - mucous membranes moist, pharynx normal without lesions and edentulous Neck - adenopathy noted PCL Lymphatics - posterior cervical nodes Chest - rales noted bibasilar, decreased BS Heart - normal rate and regular rhythm, S4 present, systolic murmur YQ6/5 at 2nd left intercostal space and radiates thoughout precordium Abdomen - incisions with keloids parallel rib cage and mid abdm, pos bs, tender LUQ. Obese,  Musculoskeletal - hypertrophic changes in knees, MCPs, PIPs Extremities - decreased DP bilat and trophic changes in feet.  AVG L groin, B&T Skin -  keloids onscars, moles,   Assessment/Plan: 1 SOB not real impressive but need CXR.  Has some xs vol and ^ bp, will do HD.   2 ESRD: Raliegh Ip.  Diet ? Event in abdm.  Need to eval.  Not clear what her sched was 3 Hypertension: needs lower vol, longer HD 4. Anemia of  ESRD: eval labs 5. Metabolic Bone Disease: start Calcitriol 6 DM 7 Liver TX con meds., has ^ LFTs ? Hx 8 DJD 9 GERD 10 Hypothyroid 11 CAD ? If this is an ischemic event 12 COPD P HD, Cardiac enz, check amylase and lipase, epo, Sensipar  Ladawna Walgren L 01/02/2014, 11:43 PM

## 2014-01-02 NOTE — Telephone Encounter (Signed)
Pt called in stating that has been having extreme SOB since she woke up this morning and would like to know what she should do. Please call  thanks

## 2014-01-02 NOTE — H&P (Signed)
PULMONARY / CRITICAL CARE MEDICINE   Name: [name] MRN: [id number] DOB: 1955-10-08    ADMISSION DATE:  01/02/2014 CONSULTATION DATE:  01/02/2014  REFERRING MD :  O[facility] Lin[facility]  CHIEF COMPLAINT:  SOB  INITIAL PRESENTATION: 58 year old female with PMH of ESRD on HD, Liver transplant, and COPD presented to [facility] ED 11/13 c/o SOB. Reports 5 lb weight gain last 4 days. CXR was c/w pulmonary edema so she was stabilized on BiPAP and transferred to [facility] for ICU admission.   STUDIES:    SIGNIFICANT EVENTS:   HISTORY OF PRESENT ILLNESS:  58 year old female with complicated PMH as outlined below, which includes ESRD on HD (TTS), COPD, Liver transplant on prograf, CAD, and aortic valve replacement. She presented to [facility] ED 11/23 c/o SOB and nausea/vomiting x 1 day. Also reports 5 lb weight gain over last 4 days. In ED was hypoxic and tachypneic. He was started on BiPAP. Her symptoms improved and she was weaned down to FiO2 via [place]. She was transferred to [facility] for ICU admission and HD.  PCCM to see for admission.   PAST MEDICAL HISTORY :   has a past medical history of S/P liver transplant; Chronic back pain; CAD (coronary artery disease); Obesity; Peripheral vascular disease (hands and legs); Anxiety; Asthma; GERD (gastroesophageal reflux disease); Chronic constipation; Anemia; Hypothyroidism; Depression; Neuromuscular disorder; Hypertension; COPD (chronic obstructive pulmonary disease); Pneumonia; Chronic bronchitis; Type II diabetes mellitus; History of blood transfusion; Hepatitis C; Migraine; Headache; Arthritis; End stage renal disease on dialysis (02/27/2011); CAD (coronary artery disease) (Jan. 2015); Aortic stenosis; Tobacco abuse; and S/P aortic valve replacement (03/15/13).  has past surgical history that includes Liver transplant (10/25/2009); Small intestine surgery (90's); Thrombectomy; Arteriovenous graft placement (Left, 10/03/10); Arteriovenous goretex graft removal  (12/23/2010); Insertion of dialysis catheter (12/23/2010); Cholecystectomy (1993); Cystoscopy (1990's); Spinal growth rods (2010); AV fistula placement (01/29/2011); AV fistula placement (03/10/2011); Tubal ligation (1990's); Cardiac catheterization (2014); Aortic valve replacement (N/A, 03/15/2013); Coronary artery bypass graft (N/A, 03/15/2013); and Intraoprative transesophageal echocardiogram (N/A, 03/15/2013). Prior to Admission medications   Medication Sig Start Date End Date Taking? Authorizing Provider  albuterol (PROVENTIL,VENTOLIN) 90 MCG/ACT inhaler Inhale 1-2 puffs into the lungs every 6 (six) hours as needed for wheezing or shortness of breath.     Historical Provider, MD  aspirin EC 81 MG tablet Take 81 mg by mouth daily.      Historical Provider, MD  calcium carbonate (TUMS - DOSED IN MG ELEMENTAL CALCIUM) 500 MG chewable tablet Chew 1 tablet by mouth daily.    Historical Provider, MD  cinacalcet (SENSIPAR) 30 MG tablet Take 30 mg by mouth daily.    Historical Provider, MD  gabapentin (NEURONTIN) 300 MG capsule Take 300 mg by mouth at bedtime.     Historical Provider, MD  insulin detemir (LEVEMIR) 100 UNIT/ML injection Inject 2-5 Units into the skin at bedtime. 150-200 do 2 units >200 do 5 units    Historical Provider, MD  levothyroxine (SYNTHROID, LEVOTHROID) 150 MCG tablet Take 150 mcg by mouth daily before breakfast.    Historical Provider, MD  LORazepam (ATIVAN) 0.5 MG tablet Take 0.5 mg by mouth at bedtime as needed for sleep.  02/08/13   Historical Provider, MD  metoprolol (LOPRESSOR) 50 MG tablet On non dialysis days take 1 & 1/2 tablet in the morning and 1 tablet in the evening. On dialysis days take 1 tablet twice daily 12/28/13   Troy Sine, MD  Multiple Vitamin (MULTIVITAMIN WITH MINERALS)  TABS tablet Take 1 tablet by mouth at bedtime.     Historical Provider, MD  ondansetron (ZOFRAN-ODT) 4 MG disintegrating tablet Take 4 mg by mouth every 8 (eight) hours as needed for nausea or  vomiting.    Historical Provider, MD  polyethylene glycol (MIRALAX / GLYCOLAX) packet Take 17 g by mouth as needed.    Historical Provider, MD  polyethylene glycol-electrolytes (NULYTELY/GOLYTELY) 420 G solution  11/18/13   Historical Provider, MD  promethazine (PHENERGAN) 12.5 MG tablet Take 12.5 mg by mouth every 4 (four) hours as needed for nausea.  02/09/13   Historical Provider, MD  RENVELA 2.4 G PACK  11/16/13   Historical Provider, MD  tacrolimus (PROGRAF) 1 MG capsule Take 3 mg by mouth 2 (two) times daily.     Historical Provider, MD  traMADol (ULTRAM) 50 MG tablet Take 50 mg by mouth every 12 (twelve) hours as needed for moderate pain.  02/09/13   Historical Provider, MD  VELPHORO 500 MG chewable tablet Chew 1 tablet by mouth 3 (three) times daily with meals.  06/10/13   Historical Provider, MD  warfarin (COUMADIN) 4 MG tablet Take 1 tablet (4 mg total) by mouth daily at 6 PM. 12/21/13   Reyne Dumas, MD   Allergies  Allergen Reactions  . Acetaminophen Other (See Comments)    Liver transplant recipient   . Codeine Itching  . Mirtazapine Other (See Comments)    hallucination  . Morphine Itching    FAMILY HISTORY:  indicated that her mother is deceased. She indicated that her father is deceased.  SOCIAL HISTORY:  reports that she quit smoking about 9 months ago. Her smoking use included Cigarettes. She has a 30 pack-year smoking history. She has never used smokeless tobacco. She reports that she drinks alcohol. She reports that she does not use illicit drugs.  REVIEW OF SYSTEMS:   Bolds are positive  Constitutional: weight loss, gain, night sweats, Fevers, chills, fatigue .  HEENT: headaches, Sore throat, sneezing, nasal congestion, post nasal drip, Difficulty swallowing, Tooth/dental problems, visual complaints visual changes, ear ache CV:  chest pain, radiates: ,Orthopnea, PND, swelling in lower extremities, dizziness, palpitations, syncope.  GI  heartburn, indigestion,  generalized abdominal pain, nausea, vomiting, diarrhea, change in bowel habits, loss of appetite, bloody stools.  Resp: cough,  Non-productive:, hemoptysis, dyspnea, chest pain, pleuritic.  Skin: rash or itching or icterus GU: dysuria, change in color of urine, urgency or frequency. flank pain, hematuria  MS: joint pain or swelling. decreased range of motion  Psych: change in mood or affect. depression or anxiety.  Neuro: difficulty with speech, weakness, numbness, ataxia    SUBJECTIVE:   VITAL SIGNS: Temp:  [99.3 F (37.4 C)] 99.3 F (37.4 C) (11/23 2119) HEMODYNAMICS:   VENTILATOR SETTINGS:   INTAKE / OUTPUT: No intake or output data in the 24 hours ending 01/02/14 2126  PHYSICAL EXAMINATION: General:  Female of normal body habitus in NAD Neuro:  Alert, oriented, non-focal HEENT:  [place]/AT, no JVD noted, PERRL Cardiovascular:  RRR, no MRG Lungs:  Scant coarse rhonchi throughout  Abdomen:  Soft, non-tender, non-distended Musculoskeletal:  No acute deformity, AV fistula R groin. +bruit, thrill. Skin:  Intact, no peripheral edema.   LABS:  CBC No results for input(s): WBC, HGB, HCT, PLT in the last 168 hours. Coag's  Recent Labs Lab 12/28/13 1037  INR 5   BMET No results for input(s): NA, K, CL, CO2, BUN, CREATININE, GLUCOSE in the last 168 hours. Electrolytes No results  for input(s): CALCIUM, MG, PHOS in the last 168 hours. Sepsis Markers No results for input(s): LATICACIDVEN, PROCALCITON, O2SATVEN in the last 168 hours. ABG No results for input(s): PHART, PCO2ART, PO2ART in the last 168 hours. Liver Enzymes No results for input(s): AST, ALT, ALKPHOS, BILITOT, ALBUMIN in the last 168 hours. Cardiac Enzymes No results for input(s): TROPONINI, PROBNP in the last 168 hours. Glucose  Recent Labs Lab 01/02/14 2111  GLUCAP 107*    Imaging No results found.   ASSESSMENT / PLAN:  PULMONARY A: Acute hypoxic respiratory failure 2nd to pulmonary  edema Pulmonary Edema in setting of suspected volume overload H/o COPD  P:   Supplemental O2 as needed to maintain SpO2 > 92% PRN BiPAP for respiratory distress HD for volume removal Check ABG CXR in AM PRN albuterol per home regimen  CARDIOVASCULAR A:  Hypertension Substernal chest pain HFrEF - Grade 2 DD 04/2013 S/p Aortic valve replacement on coumadin  P:  Tele Repeat EKG Cycle troponin Continue NTG gtt to maintain SBP < 150 Echo in AM Conitue home ASA, metoprolol  RENAL A:   ESRD on HD (TTS) Hyperkalemia  P:   Consult renal Continue home velphoro, renvela, sensipar Follow Bmet  GASTROINTESTINAL A:   Hepatitis C s/p Liver Transplant H/o GERD  P:   NPO except sips with Meds for now IV Protonix Continue preadmission prograf Check LFTs  HEMATOLOGIC A:   Coumadin coagulopathy  P:  Follow CBC Check Coags Continue coumadin at home doses  INFECTIOUS A:   No acute issues  P:   Monitor  ENDOCRINE A:   DM Immunocompromised status Hypothyroid    P:   Hold lantus while NPO CBG monitoring SSI Continue synthroid  NEUROLOGIC A:   No acute issues  P:   RASS goal: 0 Monitor  FAMILY  - Updates:   - Inter-disciplinary family meet or Palliative Care meeting due by:  11/23   Georgann Housekeeper, ACNP [facility] Pager [phone] or [phone]    ATTENDING NOTE: I have personally reviewed patient's available data, including medical history, events of note, physical examination and test results as part of my evaluation. I have discussed with resident/NP and other careteam providers such as pharmacist, RN and RRT & co-ordinated with consultants. In addition, I personally evaluated patient and elicited key history of end-stage renal disease on dialysis Tuesday/Thursday/Saturday, liver transplant recipient on immunosuppressants admitted via [facility] with hypertension, pedal edema and increased dyspnea and hypoxic respiratory  failure requiring transient BiPAP, exam findings of bilateral crackles, no S3, improved blood pressure 150/80 and dyspnea on low dose nitroglycerin drip & labs showing hyperkalemia, mildly elevated LFTs, no leukocytosis and chest x-ray consistent with pulmonary edema  Will plan for renal input and emergent dialysis given hyperkalemia Discussed with patient and daughter  Summary - acute pulmonary edema, most likely due to volume overload -doubt acute coronary syndrome, was hypertensive on admit to [place]   .  Rest per NP/medical resident whose note is outlined above and that I agree with and edited in full.    The patient is critically ill with multiple organ systems failure and requires high complexity decision making for assessment and support, frequent evaluation and titration of therapies, application of advanced monitoring technologies and extensive interpretation of multiple databases. Critical Care Time devoted to patient care services described in this note is independent of NP time is 45 minutes.  This reflects care rendered from 10 P- 10:45 PM  Kara Mead MD. FCCP. [facility] Pager [phone] If no response call [phone]

## 2014-01-02 NOTE — Telephone Encounter (Signed)
Spoke with pt, she reports waking up SOB. She used her inhaler about one hour ago with no relief. She is gasping on the phone with me and is having trouble talking. She will have her daughter take her to the ER for evaluation.

## 2014-01-03 ENCOUNTER — Encounter (HOSPITAL_COMMUNITY): Payer: Self-pay | Admitting: General Practice

## 2014-01-03 ENCOUNTER — Inpatient Hospital Stay (HOSPITAL_COMMUNITY): Payer: Medicare Other

## 2014-01-03 DIAGNOSIS — J9690 Respiratory failure, unspecified, unspecified whether with hypoxia or hypercapnia: Secondary | ICD-10-CM | POA: Diagnosis present

## 2014-01-03 DIAGNOSIS — E875 Hyperkalemia: Secondary | ICD-10-CM

## 2014-01-03 DIAGNOSIS — I359 Nonrheumatic aortic valve disorder, unspecified: Secondary | ICD-10-CM

## 2014-01-03 DIAGNOSIS — J811 Chronic pulmonary edema: Secondary | ICD-10-CM

## 2014-01-03 LAB — AMYLASE: Amylase: 27 U/L (ref 0–105)

## 2014-01-03 LAB — COMPREHENSIVE METABOLIC PANEL
ALBUMIN: 2.7 g/dL — AB (ref 3.5–5.2)
ALK PHOS: 97 U/L (ref 39–117)
ALT: 41 U/L — ABNORMAL HIGH (ref 0–35)
ALT: 44 U/L — ABNORMAL HIGH (ref 0–35)
ANION GAP: 17 — AB (ref 5–15)
AST: 48 U/L — AB (ref 0–37)
AST: 47 U/L — ABNORMAL HIGH (ref 0–37)
Albumin: 2.4 g/dL — ABNORMAL LOW (ref 3.5–5.2)
Alkaline Phosphatase: 89 U/L (ref 39–117)
Anion gap: 20 — ABNORMAL HIGH (ref 5–15)
BILIRUBIN TOTAL: 0.6 mg/dL (ref 0.3–1.2)
BUN: 53 mg/dL — ABNORMAL HIGH (ref 6–23)
BUN: 25 mg/dL — AB (ref 6–23)
CO2: 19 meq/L (ref 19–32)
CO2: 26 mEq/L (ref 19–32)
CREATININE: 8 mg/dL — AB (ref 0.50–1.10)
Calcium: 7 mg/dL — ABNORMAL LOW (ref 8.4–10.5)
Calcium: 7.6 mg/dL — ABNORMAL LOW (ref 8.4–10.5)
Chloride: 96 mEq/L (ref 96–112)
Chloride: 96 mEq/L (ref 96–112)
Creatinine, Ser: 4.33 mg/dL — ABNORMAL HIGH (ref 0.50–1.10)
GFR calc Af Amer: 12 mL/min — ABNORMAL LOW (ref >90)
GFR calc non Af Amer: 10 mL/min — ABNORMAL LOW (ref >90)
GFR, EST AFRICAN AMERICAN: 6 mL/min — AB (ref >90)
GFR, EST NON AFRICAN AMERICAN: 5 mL/min — AB (ref >90)
GLUCOSE: 221 mg/dL — AB (ref 70–99)
Glucose, Bld: 120 mg/dL — ABNORMAL HIGH (ref 70–99)
POTASSIUM: 4 meq/L (ref 3.7–5.3)
Potassium: 5.8 mEq/L — ABNORMAL HIGH (ref 3.7–5.3)
SODIUM: 139 meq/L (ref 137–147)
Sodium: 135 mEq/L — ABNORMAL LOW (ref 137–147)
Total Bilirubin: 0.8 mg/dL (ref 0.3–1.2)
Total Protein: 6 g/dL (ref 6.0–8.3)
Total Protein: 6.8 g/dL (ref 6.0–8.3)

## 2014-01-03 LAB — CBC
HCT: 32.8 % — ABNORMAL LOW (ref 36.0–46.0)
HEMOGLOBIN: 10.4 g/dL — AB (ref 12.0–15.0)
MCH: 33.3 pg (ref 26.0–34.0)
MCHC: 31.7 g/dL (ref 30.0–36.0)
MCV: 105.1 fL — AB (ref 78.0–100.0)
Platelets: 118 10*3/uL — ABNORMAL LOW (ref 150–400)
RBC: 3.12 MIL/uL — AB (ref 3.87–5.11)
RDW: 16.5 % — ABNORMAL HIGH (ref 11.5–15.5)
WBC: 8 10*3/uL (ref 4.0–10.5)

## 2014-01-03 LAB — GLUCOSE, CAPILLARY
GLUCOSE-CAPILLARY: 160 mg/dL — AB (ref 70–99)
Glucose-Capillary: 148 mg/dL — ABNORMAL HIGH (ref 70–99)
Glucose-Capillary: 73 mg/dL (ref 70–99)
Glucose-Capillary: 145 mg/dL — ABNORMAL HIGH (ref 70–99)

## 2014-01-03 LAB — PRO B NATRIURETIC PEPTIDE: Pro B Natriuretic peptide (BNP): 33098 pg/mL — ABNORMAL HIGH (ref 0–125)

## 2014-01-03 LAB — LIPASE, BLOOD: LIPASE: 15 U/L (ref 11–59)

## 2014-01-03 LAB — TROPONIN I
Troponin I: <0.3 ng/mL (ref <0.30)
Troponin I: <0.3 ng/mL (ref <0.30)

## 2014-01-03 LAB — PHOSPHORUS
PHOSPHORUS: 6.5 mg/dL — AB (ref 2.3–4.6)
Phosphorus: 3.3 mg/dL (ref 2.3–4.6)

## 2014-01-03 LAB — PROTIME-INR
INR: 2.29 — AB (ref 0.00–1.49)
PROTHROMBIN TIME: 25.4 s — AB (ref 11.6–15.2)

## 2014-01-03 LAB — MRSA PCR SCREENING: MRSA BY PCR: NEGATIVE

## 2014-01-03 LAB — LACTIC ACID, PLASMA: Lactic Acid, Venous: 2.4 mmol/L — ABNORMAL HIGH (ref 0.5–2.2)

## 2014-01-03 LAB — MAGNESIUM: MAGNESIUM: 1.7 mg/dL (ref 1.5–2.5)

## 2014-01-03 MED ORDER — CETYLPYRIDINIUM CHLORIDE 0.05 % MT LIQD
7.0000 mL | Freq: Two times a day (BID) | OROMUCOSAL | Status: DC
  Administered 2014-01-03 – 2014-01-04 (×3): 7 mL via OROMUCOSAL

## 2014-01-03 MED ORDER — METOPROLOL TARTRATE 25 MG PO TABS
25.0000 mg | ORAL_TABLET | Freq: Two times a day (BID) | ORAL | Status: DC
Start: 2014-01-03 — End: 2014-01-04
  Administered 2014-01-03 (×2): 25 mg via ORAL
  Filled 2014-01-03 (×4): qty 1

## 2014-01-03 MED ORDER — WARFARIN SODIUM 4 MG PO TABS
4.0000 mg | ORAL_TABLET | Freq: Once | ORAL | Status: AC
  Administered 2014-01-03: 4 mg via ORAL
  Filled 2014-01-03: qty 1

## 2014-01-03 MED ORDER — DARBEPOETIN ALFA 60 MCG/0.3ML IJ SOSY
PREFILLED_SYRINGE | INTRAMUSCULAR | Status: AC
  Filled 2014-01-03: qty 0.3

## 2014-01-03 MED ORDER — ALTEPLASE 2 MG IJ SOLR
2.0000 mg | Freq: Once | INTRAMUSCULAR | Status: AC | PRN
  Filled 2014-01-03: qty 2

## 2014-01-03 MED ORDER — HEPARIN SODIUM (PORCINE) 1000 UNIT/ML DIALYSIS
100.0000 [IU]/kg | INTRAMUSCULAR | Status: DC | PRN
  Filled 2014-01-03: qty 9

## 2014-01-03 MED ORDER — SODIUM CHLORIDE 0.9 % IV SOLN
100.0000 mL | INTRAVENOUS | Status: DC | PRN
Start: 2014-01-03 — End: 2014-01-04

## 2014-01-03 MED ORDER — INSULIN ASPART 100 UNIT/ML ~~LOC~~ SOLN
0.0000 [IU] | Freq: Every day | SUBCUTANEOUS | Status: DC
  Administered 2014-01-04: 3 [IU] via SUBCUTANEOUS

## 2014-01-03 MED ORDER — INSULIN ASPART 100 UNIT/ML ~~LOC~~ SOLN
0.0000 [IU] | Freq: Three times a day (TID) | SUBCUTANEOUS | Status: DC
  Administered 2014-01-03 – 2014-01-04 (×4): 3 [IU] via SUBCUTANEOUS

## 2014-01-03 MED ORDER — CALCITRIOL 0.5 MCG PO CAPS
0.5000 ug | ORAL_CAPSULE | ORAL | Status: DC
  Administered 2014-01-03: 0.5 ug via ORAL
  Filled 2014-01-03: qty 1

## 2014-01-03 MED ORDER — LIDOCAINE-PRILOCAINE 2.5-2.5 % EX CREA: 1.0000 "application " | TOPICAL_CREAM | CUTANEOUS | Status: DC | PRN

## 2014-01-03 MED ORDER — SODIUM CHLORIDE 0.9 % IV SOLN: 100.0000 mL | INTRAVENOUS | Status: DC | PRN

## 2014-01-03 MED ORDER — HEPARIN SODIUM (PORCINE) 1000 UNIT/ML DIALYSIS
1000.0000 [IU] | INTRAMUSCULAR | Status: DC | PRN
  Filled 2014-01-03: qty 1

## 2014-01-03 MED ORDER — INSULIN ASPART 100 UNIT/ML ~~LOC~~ SOLN
3.0000 [IU] | Freq: Three times a day (TID) | SUBCUTANEOUS | Status: DC
  Administered 2014-01-03 – 2014-01-04 (×4): 3 [IU] via SUBCUTANEOUS

## 2014-01-03 MED ORDER — PENTAFLUOROPROP-TETRAFLUOROETH EX AERO: 1.0000 "application " | INHALATION_SPRAY | CUTANEOUS | Status: DC | PRN

## 2014-01-03 MED ORDER — DARBEPOETIN ALFA 60 MCG/0.3ML IJ SOSY
60.0000 ug | PREFILLED_SYRINGE | INTRAMUSCULAR | Status: DC
  Administered 2014-01-03: 60 ug via INTRAVENOUS
  Filled 2014-01-03: qty 0.3

## 2014-01-03 MED ORDER — LIDOCAINE HCL (PF) 1 % IJ SOLN: 5.0000 mL | INTRAMUSCULAR | Status: DC | PRN

## 2014-01-03 MED ORDER — NEPRO/CARBSTEADY PO LIQD
237.0000 mL | ORAL | Status: DC | PRN
  Filled 2014-01-03: qty 237

## 2014-01-03 NOTE — Progress Notes (Signed)
Much improved after HD Comfortable on [place] O2 No new complaints  Filed Vitals:   01/03/14 0900 01/03/14 1000 01/03/14 1100 01/03/14 1154  BP: 150/58 168/57 149/57 145/46  Pulse: 73 72 67 68  Temp:    98.2 F (36.8 C)  TempSrc:    Oral  Resp: 15  18 17   Height:      Weight:      SpO2: 99%  100% 100%   NAD JVP not visualized Dull with bronch BS approx 1/3 up on R Few bibasilar crackles No wheezes RRR s M NABS, soft Ext warm, no edema   I have reviewed all of today's lab results. Relevant abnormalities are discussed in the A/P section   CXR: Improving edema pattern  IMPRESSION: Acute resp failure - much improved Pulm edema - improving H/O COPD - no acute bronchospasm Hypertension - controlled Diastolic heart failure Mechanical AoV ESRD S/P liver transplant Chronic immunosuppression H/O GERD Chronic warfarin for mechanical valve   PLAN: Med regimen reviewed in detail and adjusted Renal following for HD Pharmacy managing anticoagulation Continue immunosuppressants Transfer to med-surg [facility] to assume care as of AM 11/25 and PCCM to sign off - discussed with Dr Marton Redwood, MD ; [facility] service Mobile [phone].  After 5:30 PM or weekends, call [phone]

## 2014-01-03 NOTE — Progress Notes (Signed)
ANTICOAGULATION CONSULT NOTE - Initial Consult  Pharmacy Consult for Warfarin Indication: Aortic Valve Replacement  Allergies  Allergen Reactions  . Acetaminophen Other (See Comments)    Liver transplant recipient   . Codeine Itching  . Mirtazapine Other (See Comments)    hallucination  . Morphine Itching    Patient Measurements: Height: 5\' 4"  (162.6 cm) Weight: 173 lb 4.5 oz (78.6 kg) IBW/kg (Calculated) : 54.7  Vital Signs: Temp: 98.2 F (36.8 C) (11/24 1154) Temp Source: Oral (11/24 1154) BP: 145/46 mmHg (11/24 1154) Pulse Rate: 68 (11/24 1154)  Labs:  Recent Labs  01/02/14 2318 01/03/14 0400  HGB  --  10.4*  HCT  --  32.8*  PLT  --  118*  LABPROT 26.3* 25.4*  INR 2.39* 2.29*  CREATININE 8.00* 4.33*  TROPONINI <0.30 <0.30    Estimated Creatinine Clearance: 14.2 mL/min (by C-G formula based on Cr of 4.33).   Medical History: Past Medical History  Diagnosis Date  . 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 Chronic back pain   . CAD (coronary artery disease)   . Obesity   . Peripheral vascular disease hands and legs  . Anxiety   . Asthma   . GERD (gastroesophageal reflux disease)     takes Omeprazole daily  . Chronic constipation     takes MIralax and Colace daily  . Anemia     takes Folic Acid daily  . Hypothyroidism     takes Synthroid daily  . Depression     takes Cymbalta for "severe" depression  . Neuromuscular disorder     carpal tunnel in right hand  . Hypertension     takes Metoprolol and Lisinopril daily, sees Dr Bea Graff  . COPD (chronic obstructive pulmonary disease)   . Pneumonia     "today and several times before" (08/30/2012)  . Chronic bronchitis     "q yr w/season changes" (08/30/2012)  . Type II diabetes mellitus     Levemir 2units daily if > 150  . History of blood transfusion     "several" (08/30/2012)  . Hepatitis C   . Migraine     "last migraine  was in 2013" (08/30/2012)  . Headache     "at least monthly" (08/30/2012)  . Arthritis     "left hand, back" (08/30/2012)  . End stage renal disease on dialysis 02/27/2011    Kidneys shut down at time of liver transplant in Sept 2011 at [facility] in [place], she has been on HD ever since.  Dialyzes at [facility] HD on TTS schedule.  Had L forearm graft used 10 months then removed Dec 2012 due to suspected infection.  A right upper arm AVG was placed Dec 2012 but she developed steal symptoms acutely and it was ligated the same day.  Never had an AV fistula due to small veins.  Now has L thigh AVG put in Jan 2013, has not clotted to date.    [name] CAD (coronary artery disease) Jan. 2015    Cath: 20% LAD, 50% D1; s/p LIMA-LAD  . Aortic stenosis   . Tobacco abuse   . S/P aortic valve replacement 03/15/13    Mechanical     Medications:  Scheduled:  . antiseptic oral rinse  7 mL Mouth Rinse BID  . aspirin EC  81 mg Oral Daily  . calcitRIOL  0.5 mcg Oral QODAY  .  cinacalcet  30 mg Oral Q breakfast  . darbepoetin (ARANESP) injection - DIALYSIS  60 mcg Intravenous Q Tue-HD  . insulin aspart  0-15 Units Subcutaneous TID WC  . insulin aspart  0-5 Units Subcutaneous QHS  . insulin aspart  3 Units Subcutaneous TID WC  . levothyroxine  150 mcg Oral QAC breakfast  . metoprolol  25 mg Oral BID  . multivitamin  1 tablet Oral QHS  . sevelamer carbonate  2.4 g Oral BID WC  . sodium chloride  3 mL Intravenous Q12H  . sucroferric oxyhydroxide  500 mg Oral TID WC  . tacrolimus  3 mg Oral BID  . warfarin  4 mg Oral ONCE-1800  . Warfarin - Physician Dosing Inpatient   Does not apply q1800    Assessment: 41 yoF with a history of aortic valve replacement on coumadin prior to arrival.  According to [facility]'s notes, her goal INR is 2.5 to 3.  Her home dose was recently decreased on 11/18 to 3 mg daily due to a supratherapeutic INR of 5 outpatient.    Pharmacy consulted to manage warfarin while inpatient.  Her INR on  admission was 2.39.  She received 4 mg X 1 and INR trended down to 2.29. Unclear if patient was compliant with warfarin prior to coming into the hospital as INR trending down.     Goal of Therapy:  INR 2.5-3 Monitor platelets by anticoagulation protocol: Yes   Plan:  1) Repeat Warfarin 4 mg X 1 tonight 2) Follow daily INR, signs of bleeding 3) Follow HD schedule, volume removal  Theron Arista, PharmD Clinical Pharmacist - Resident Pager: [phone] 11/24/20152:04 PM

## 2014-01-03 NOTE — Progress Notes (Signed)
UR Completed.  Vergie Living [id number] 01/03/2014

## 2014-01-03 NOTE — Procedures (Signed)
I have personally attended this patient's dialysis session.   HD again today to remove additional volume d/t pulm edema  (still 2 kg over EDW despite HD yesterday) 2K 3.5 Ca bath L thigh AVG  Jamal Maes, MD [facility] [phone] Pager 01/03/2014, 5:43 PM

## 2014-01-03 NOTE — Progress Notes (Signed)
[name] ROUNDING NOTE  Subjective:  Breathing better but says still SOB some No nausea Left arm and left side hurt - says she fell in the tub earlier in the week No chest pain Had emergent HD last night - only got off 1.6 kg Weight of 78.6 still 2 kg over EDW of 76.5 Had temps as high as 100.7 during the night but afebrile this AM  Objective Vital signs in last 24 hours: Filed Vitals:   01/03/14 0700 01/03/14 0730 01/03/14 0756 01/03/14 0800  BP: 109/47 127/43  151/53  Pulse: 80 75  73  Temp:   98.4 F (36.9 C)   TempSrc:   Oral   Resp: 23 16  17   Height:      Weight:      SpO2: 98% 99%  99%   Weight change:   Intake/Output Summary (Last 24 hours) at 01/03/14 0849 Last data filed at 01/03/14 0800  Gross per 24 hour  Intake 175.85 ml  Output   1699 ml  Net -1523.15 ml   Physical Exam:  BP 151/53 mmHg  Pulse 73  Temp(Src) 98.4 F (36.9 C) (Oral)  Resp 17  Ht 5\' 4"  (1.626 m)  Wt 78.6 kg (173 lb 4.5 oz)  BMI 29.73 kg/m2  SpO2 99% Very nice older WF Still feels short of breath +JVD Tubular breath sounds at bases 1/2 up post. Ant fairly clear Regular S1S2 No S3 1/6 murmur USB No diastolic murmur or rub Abd soft, keloid formation over area of liver transplant Tender left abd and flank  Large hematoma left upper arm (from fall in the bathtub earlier in the week) No sig edema of LE's   Recent Labs Lab 01/02/14 2318 01/03/14 0400  NA 135* 139  K 5.8* 4.0  CL 96 96  CO2 19 26  GLUCOSE 221* 120*  BUN 53* 25*  CREATININE 8.00* 4.33*  CALCIUM 7.0* 7.6*  PHOS 6.5* 3.3   Liver Function Tests:  Recent Labs Lab 01/02/14 2318 01/03/14 0400  AST 48* 47*  ALT 41* 44*  ALKPHOS 89 97  BILITOT 0.6 0.8  PROT 6.0 6.8  ALBUMIN 2.4* 2.7*    Recent Labs Lab 01/03/14 0400  LIPASE 15  AMYLASE 27  CBC:  Recent Labs Lab 01/03/14 0400  WBC 8.0  HGB 10.4*  HCT 32.8*  MCV 105.1*  PLT 118*    Recent Labs Lab 01/02/14 2318  01/03/14 0400  TROPONINI <0.30 <0.30   CBG:  Recent Labs Lab 01/02/14 2111  GLUCAP 107*   Studies/Results: Dg Chest Port 1 View  01/03/2014   CLINICAL DATA:  Shortness of breath.  EXAM: PORTABLE CHEST - 1 VIEW  COMPARISON:  01/02/2014, 12/15/2013.  FINDINGS: Mediastinum hilar structures are unremarkable. Prior median sternotomy. Stable cardiomegaly. Interim partial clearing of bilateral pulmonary edema with moderate residual. Small left pleural effusion. Right cardiophrenic angle not imaged. No pneumothorax. No acute bony abnormality.  IMPRESSION: Interim partial clearing of bilateral pulmonary edema with moderate residual. Small left pleural effusion.   Electronically Signed   By: Marcello Moores  Register   On: 01/03/2014 07:49   Dg Chest Port 1 View  01/03/2014   CLINICAL DATA:  Heart failure  EXAM: PORTABLE CHEST - 1 VIEW  COMPARISON:  01/02/2014 at 4:48 p.m.  FINDINGS: Unchanged diffuse pulmonary edema with small pleural effusions. Stable mild cardiac enlargement. The patient is status post median sternotomy for aortic valve replacement. No pneumothorax.  IMPRESSION: Unchanged pulmonary edema and layering pleural effusions.  Electronically Signed   By: Jorje Guild M.D.   On: 01/03/2014 00:19   Medications: . nitroGLYCERIN Stopped (01/03/14 0247)   . antiseptic oral rinse  7 mL Mouth Rinse BID  . aspirin EC  81 mg Oral Daily  . calcitRIOL  0.5 mcg Oral QODAY  . cinacalcet  30 mg Oral Q breakfast  . Darbepoetin Alfa      . darbepoetin (ARANESP) injection - DIALYSIS  60 mcg Intravenous Q Tue-HD  . levothyroxine  150 mcg Oral QAC breakfast  . metoprolol  50 mg Oral BID  . multivitamin  1 tablet Oral QHS  . pantoprazole (PROTONIX) IV  40 mg Intravenous Q24H  . sevelamer carbonate  2.4 g Oral BID WC  . sodium chloride  3 mL Intravenous Q12H  . sucroferric oxyhydroxide  500 mg Oral TID WC  . tacrolimus  3 mg Oral BID  . warfarin  4 mg Oral q1800  . Warfarin - Physician Dosing  Inpatient   Does not apply q1800    HD TTS [place] 3'45" 180 400/A1.5 EDW 76.5 kg 2K2.5Ca Left thigh AVG No heparin Aranesp 50/week Calcitriol 0.5 TIW  ASSESSMENT/RECOMMENDATIONS  Pulmonary edema - partial clearing on CXR and still some SOB. Will need additional UF with dialysis again today. Still 2 kg over EDW. Hypotension during treatment limited UF to 1.6 liters last PM.  Enzymes negative for cardiac ischemic event. Last ECHO March 2015 with EF 55% (post CABG and AoVR). ECHO would not be unreasonable. (pulm edema now with little in the way of peripheral edema...)  Abd pain - amylase/lipase nl. Now pain is in area of trauma from fall in bathtub though no flank or side hematoma (just large hematoma on on left arm)  ESRD - usual HD TTS. Will be off "holiday schedule" due to need for additional HD today.   HTN - BP soft side now. Metoprolol 50 BID.  Reduce this to allow for ultrafiltration with HD - cut to 25 BID  Anemia - on weekly Aranesp 50. 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 Mild transaminase elevations  CAD/CABG/AoVR 03/2013 trops neg. ECHO as above. Coumadin for valve. Pharmacy dosing. Had cath 12/16/13 for unstable angina - patent LIMA to mid LAD at that time.   S/p fall with large left upper arm hematoma, left side and flank pain - ? Image abd?  Hypothyroidism - on replacement  Fever - low grade on admission. WBC normal. Afebrile now. No ATB's. Watch  COPD  DM2  Jamal Maes, MD [facility] [phone] pager 01/03/2014, 8:49 AM

## 2014-01-03 NOTE — Progress Notes (Signed)
  Echocardiogram 2D Echocardiogram has been performed.  Diamond Nickel 01/03/2014, 3:14 PM

## 2014-01-04 LAB — COMPREHENSIVE METABOLIC PANEL
ALBUMIN: 2.7 g/dL — AB (ref 3.5–5.2)
ALT: 46 U/L — ABNORMAL HIGH (ref 0–35)
ANION GAP: 15 (ref 5–15)
AST: 62 U/L — ABNORMAL HIGH (ref 0–37)
Alkaline Phosphatase: 90 U/L (ref 39–117)
BILIRUBIN TOTAL: 0.8 mg/dL (ref 0.3–1.2)
BUN: 25 mg/dL — AB (ref 6–23)
CHLORIDE: 99 meq/L (ref 96–112)
CO2: 27 mEq/L (ref 19–32)
Calcium: 9.3 mg/dL (ref 8.4–10.5)
Creatinine, Ser: 4.72 mg/dL — ABNORMAL HIGH (ref 0.50–1.10)
GFR calc Af Amer: 11 mL/min — ABNORMAL LOW (ref >90)
GFR calc non Af Amer: 9 mL/min — ABNORMAL LOW (ref >90)
Glucose, Bld: 149 mg/dL — ABNORMAL HIGH (ref 70–99)
POTASSIUM: 4.7 meq/L (ref 3.7–5.3)
Sodium: 141 mEq/L (ref 137–147)
Total Protein: 6.9 g/dL (ref 6.0–8.3)

## 2014-01-04 LAB — CBC
HEMATOCRIT: 33.5 % — AB (ref 36.0–46.0)
HEMOGLOBIN: 10.3 g/dL — AB (ref 12.0–15.0)
MCH: 32.4 pg (ref 26.0–34.0)
MCHC: 30.7 g/dL (ref 30.0–36.0)
MCV: 105.3 fL — ABNORMAL HIGH (ref 78.0–100.0)
Platelets: 102 10*3/uL — ABNORMAL LOW (ref 150–400)
RBC: 3.18 MIL/uL — AB (ref 3.87–5.11)
RDW: 16 % — AB (ref 11.5–15.5)
WBC: 5.2 10*3/uL (ref 4.0–10.5)

## 2014-01-04 LAB — GLUCOSE, CAPILLARY
GLUCOSE-CAPILLARY: 209 mg/dL — AB (ref 70–99)
GLUCOSE-CAPILLARY: 264 mg/dL — AB (ref 70–99)
Glucose-Capillary: 169 mg/dL — ABNORMAL HIGH (ref 70–99)
Glucose-Capillary: 185 mg/dL — ABNORMAL HIGH (ref 70–99)
Glucose-Capillary: 162 mg/dL — ABNORMAL HIGH (ref 70–99)

## 2014-01-04 LAB — PHOSPHORUS: Phosphorus: 4.9 mg/dL — ABNORMAL HIGH (ref 2.3–4.6)

## 2014-01-04 LAB — PROTIME-INR
INR: 2.3 — AB (ref 0.00–1.49)
Prothrombin Time: 25.5 seconds — ABNORMAL HIGH (ref 11.6–15.2)

## 2014-01-04 MED ORDER — SODIUM CHLORIDE 0.9 % IV SOLN: 100.0000 mL | INTRAVENOUS | Status: DC | PRN

## 2014-01-04 MED ORDER — NEPRO/CARBSTEADY PO LIQD: 237.0000 mL | ORAL | Status: DC | PRN

## 2014-01-04 MED ORDER — HEPARIN SODIUM (PORCINE) 1000 UNIT/ML DIALYSIS: 1000.0000 [IU] | INTRAMUSCULAR | Status: DC | PRN

## 2014-01-04 MED ORDER — ALTEPLASE 2 MG IJ SOLR
2.0000 mg | Freq: Once | INTRAMUSCULAR | Status: DC | PRN
  Filled 2014-01-04: qty 2

## 2014-01-04 MED ORDER — WARFARIN SODIUM 4 MG PO TABS
4.0000 mg | ORAL_TABLET | Freq: Once | ORAL | Status: DC
  Filled 2014-01-04 (×2): qty 1

## 2014-01-04 MED ORDER — LIDOCAINE-PRILOCAINE 2.5-2.5 % EX CREA: 1.0000 "application " | TOPICAL_CREAM | CUTANEOUS | Status: DC | PRN

## 2014-01-04 MED ORDER — PENTAFLUOROPROP-TETRAFLUOROETH EX AERO: 1.0000 "application " | INHALATION_SPRAY | CUTANEOUS | Status: DC | PRN

## 2014-01-04 MED ORDER — LIDOCAINE HCL (PF) 1 % IJ SOLN: 5.0000 mL | INTRAMUSCULAR | Status: DC | PRN

## 2014-01-04 NOTE — Discharge Summary (Signed)
Physician Discharge Summary  [name] XLK:[id number] DOB: 1956/01/19 DOA: 01/02/2014  PCP: Charlynn Court, NP  Admit date: 01/02/2014 Discharge date: 01/04/2014  Time spent: 40 minutes  Recommendations for Outpatient Follow-up:  1. Follow-up with primary care physician in one week.  Discharge Diagnoses:  Active Problems:   End stage renal disease on dialysis-TTS   Pulmonary edema   Hyperkalemia   Respiratory failure   Discharge Condition: Stable  Diet recommendation: Heart healthy with 1200 mL fluid restriction per day  [facility] Weights   01/03/14 2110 01/03/14 2229 01/04/14 1247  Weight: 77.3 kg (170 lb 6.7 oz) 76.5 kg (168 lb 10.4 oz) 77.1 kg (169 lb 15.6 oz)    History of present illness:  58 year old female with complicated PMH as outlined below, which includes ESRD on HD (TTS), COPD, Liver transplant on prograf, CAD, and aortic valve replacement. She presented to [facility] ED 11/23 c/o SOB and nausea/vomiting x 1 day. Also reports 5 lb weight gain over last 4 days. In ED was hypoxic and tachypneic. He was started on BiPAP. Her symptoms improved and she was weaned down to FiO2 via [place]. She was transferred to [facility] for ICU admission and HD.   Hospital Course:   Active Problems:   End stage renal disease on dialysis-TTS   Pulmonary edema   Hyperkalemia   Respiratory failure   Acute hypoxic respiratory failure Likely secondary to noncardiogenic pulmonary edema from fluid overload. Patient was placed on BiPAP briefly and admitted to the ICU upon presentation to the hospital. Currently not on oxygen, after dialysis breathing much better.  End-stage renal disease Patient reported that she did not miss her dialysis, but she might have more consumption of fluids. Nephrology consulted calcium was done on Tuesday, her next dialysis day supposed to be on Thursday. Patient dialyzed today also prior to discharge to be on holiday schedule. As Thursday is 11/26  Thanksgiving day.  Hyperkalemia Resolved after dialysis. Presented with a potassium of 5.8.   Pulmonary edema Non-cardiogenic pulmonary edema secondary to fluid overload and end-stage renal disease. This is resolved after dialysis.  Transaminitis Patient has history of liver transplant, she is on Prograf. Mild transaminitis, unclear etiology. Fluid overload can cause congestive hepatopathy. Repeat hepatic function tests in 1-2 weeks   Procedures:  Dialysis  Consultations:  Patient was on her PCCM service transfer to [facility] hospitalist on 11/25.  Nephrology  Discharge Exam: Filed Vitals:   01/04/14 1256  BP: 185/72  Pulse: 70  Temp:   Resp: 15   General: Alert and awake, oriented x3, not in any acute distress. HEENT: anicteric sclera, pupils reactive to light and accommodation, EOMI CVS: S1-S2 clear, no murmur rubs or gallops Chest: clear to auscultation bilaterally, no wheezing, rales or rhonchi Abdomen: soft nontender, nondistended, normal bowel sounds, no organomegaly Extremities: no cyanosis, clubbing or edema noted bilaterally Neuro: Cranial nerves II-XII intact, no focal neurological deficits  Discharge Instructions You were cared for by a hospitalist during your hospital stay. If you have any questions about your discharge medications or the care you received while you were in the hospital after you are discharged, you can call the unit and asked to speak with the hospitalist on call if the hospitalist that took care of you is not available. Once you are discharged, your primary care physician will handle any further medical issues. Please note that NO REFILLS for any discharge medications will be authorized once you are discharged, as it is imperative that  you return to your primary care physician (or establish a relationship with a primary care physician if you do not have one) for your aftercare needs so that they can reassess your need for medications and monitor  your lab values.  Discharge Instructions    Diet - low sodium heart healthy    Complete by:  As directed      Increase activity slowly    Complete by:  As directed           Current Discharge Medication List    CONTINUE these medications which have NOT CHANGED   Details  albuterol (PROVENTIL,VENTOLIN) 90 MCG/ACT inhaler Inhale 1-2 puffs into the lungs every 6 (six) hours as needed for wheezing or shortness of breath.     aspirin EC 81 MG tablet Take 81 mg by mouth daily.      calcium carbonate (TUMS - DOSED IN MG ELEMENTAL CALCIUM) 500 MG chewable tablet Chew 1 tablet by mouth daily.    cinacalcet (SENSIPAR) 30 MG tablet Take 30 mg by mouth daily.    gabapentin (NEURONTIN) 300 MG capsule Take 300 mg by mouth at bedtime.     insulin detemir (LEVEMIR) 100 UNIT/ML injection Inject 2-5 Units into the skin daily. 150-200 do 2 units >200 do 5 units    levothyroxine (SYNTHROID, LEVOTHROID) 150 MCG tablet Take 150 mcg by mouth daily before breakfast.    LORazepam (ATIVAN) 0.5 MG tablet Take 0.5 mg by mouth at bedtime as needed for sleep.     metoprolol (LOPRESSOR) 50 MG tablet On non dialysis days take 1 & 1/2 tablet in the morning and 1 tablet in the evening. On dialysis days take 1 tablet twice daily Qty: 180 tablet, Refills: 3    Multiple Vitamin (MULTIVITAMIN WITH MINERALS) TABS tablet Take 1 tablet by mouth at bedtime.     ondansetron (ZOFRAN-ODT) 4 MG disintegrating tablet Take 4 mg by mouth every 8 (eight) hours as needed for nausea or vomiting.    polyethylene glycol (MIRALAX / GLYCOLAX) packet Take 17 g by mouth as needed.    promethazine (PHENERGAN) 12.5 MG tablet Take 12.5 mg by mouth every 4 (four) hours as needed for nausea.     RENVELA 2.4 G PACK Take 2.4 g by mouth 3 (three) times daily with meals.     tacrolimus (PROGRAF) 1 MG capsule Take 3 mg by mouth 2 (two) times daily.     traMADol (ULTRAM) 50 MG tablet Take 50 mg by mouth every 12 (twelve) hours as needed  for moderate pain.     VELPHORO 500 MG chewable tablet Chew 1 tablet by mouth 3 (three) times daily with meals.     warfarin (COUMADIN) 3 MG tablet Take 3 mg by mouth daily.       Allergies  Allergen Reactions  . Acetaminophen Other (See Comments)    Liver transplant recipient   . Codeine Itching  . Mirtazapine Other (See Comments)    hallucination  . Morphine Itching   Follow-up Information    Follow up with Charlynn Court, NP In 1 week.   Specialty:  Nurse Practitioner   Contact information:   [place] [address] [phone]        The results of significant diagnostics from this hospitalization (including imaging, microbiology, ancillary and laboratory) are listed below for reference.    Significant Diagnostic Studies: Dg Chest Port 1 View  01/03/2014   CLINICAL DATA:  Shortness of breath.  EXAM: PORTABLE  CHEST - 1 VIEW  COMPARISON:  01/02/2014, 12/15/2013.  FINDINGS: Mediastinum hilar structures are unremarkable. Prior median sternotomy. Stable cardiomegaly. Interim partial clearing of bilateral pulmonary edema with moderate residual. Small left pleural effusion. Right cardiophrenic angle not imaged. No pneumothorax. No acute bony abnormality.  IMPRESSION: Interim partial clearing of bilateral pulmonary edema with moderate residual. Small left pleural effusion.   Electronically Signed   By: Marcello Moores  Register   On: 01/03/2014 07:49   Dg Chest Port 1 View  01/03/2014   CLINICAL DATA:  Heart failure  EXAM: PORTABLE CHEST - 1 VIEW  COMPARISON:  01/02/2014 at 4:48 p.m.  FINDINGS: Unchanged diffuse pulmonary edema with small pleural effusions. Stable mild cardiac enlargement. The patient is status post median sternotomy for aortic valve replacement. No pneumothorax.  IMPRESSION: Unchanged pulmonary edema and layering pleural effusions.   Electronically Signed   By: Jorje Guild M.D.   On: 01/03/2014 00:19   Dg Chest Portable 1 View  12/15/2013   EXAM:  PORTABLE CHEST - 1 VIEW  COMPARISON:  None.  FINDINGS: The heart size and mediastinal contours are within normal limits. Both lungs are clear. The visualized skeletal structures are unremarkable.  IMPRESSION: No active disease.   Electronically Signed   By: Franchot Gallo M.D.   On: 12/15/2013 14:27    Microbiology: Recent Results (from the past 240 hour(s))  MRSA PCR Screening     Status: None   Collection Time: 01/02/14  9:19 PM  Result Value Ref Range Status   MRSA by PCR NEGATIVE NEGATIVE Final    Comment:        The GeneXpert MRSA Assay (FDA approved for NASAL specimens only), is one component of a comprehensive MRSA colonization surveillance program. It is not intended to diagnose MRSA infection nor to guide or monitor treatment for MRSA infections.      Labs: Basic Metabolic Panel:  Recent Labs Lab 01/02/14 2318 01/03/14 0400 01/04/14 0539  NA 135* 139 141  K 5.8* 4.0 4.7  CL 96 96 99  CO2 19 26 27   GLUCOSE 221* 120* 149*  BUN 53* 25* 25*  CREATININE 8.00* 4.33* 4.72*  CALCIUM 7.0* 7.6* 9.3  MG 1.7  --   --   PHOS 6.5* 3.3 4.9*   Liver Function Tests:  Recent Labs Lab 01/02/14 2318 01/03/14 0400 01/04/14 0539  AST 48* 47* 62*  ALT 41* 44* 46*  ALKPHOS 89 97 90  BILITOT 0.6 0.8 0.8  PROT 6.0 6.8 6.9  ALBUMIN 2.4* 2.7* 2.7*    Recent Labs Lab 01/03/14 0400  LIPASE 15  AMYLASE 27   No results for input(s): AMMONIA in the last 168 hours. CBC:  Recent Labs Lab 01/03/14 0400 01/04/14 0539  WBC 8.0 5.2  HGB 10.4* 10.3*  HCT 32.8* 33.5*  MCV 105.1* 105.3*  PLT 118* 102*   Cardiac Enzymes:  Recent Labs Lab 01/02/14 2318 01/03/14 0400  TROPONINI <0.30 <0.30   BNP: BNP (last 3 results)  Recent Labs  01/02/14 2318  PROBNP 33098.0*   CBG:  Recent Labs Lab 01/03/14 1920 01/03/14 2226 01/04/14 0010 01/04/14 0735 01/04/14 1140  GLUCAP 145* 209* 264* 169* 185*       Signed:  Carmichael Burdette A  [facility] 01/04/2014, 1:06 PM   '

## 2014-01-04 NOTE — Discharge Instructions (Signed)

## 2014-01-04 NOTE — Progress Notes (Signed)
ANTICOAGULATION CONSULT NOTE - Follow up Pharmacy Consult for Warfarin Indication: Aortic Valve Replacement  Allergies  Allergen Reactions  . Acetaminophen Other (See Comments)    Liver transplant recipient   . Codeine Itching  . Mirtazapine Other (See Comments)    hallucination  . Morphine Itching    Patient Measurements: Height: 5\' 4"  (162.6 cm) Weight: 169 lb 15.6 oz (77.1 kg) IBW/kg (Calculated) : 54.7  Vital Signs: Temp: 98.8 F (37.1 C) (11/25 1247) Temp Source: Oral (11/25 1247) BP: 88/47 mmHg (11/25 1454) Pulse Rate: 86 (11/25 1454)  Labs:  Recent Labs  01/02/14 2318 01/03/14 0400 01/04/14 0539  HGB  --  10.4* 10.3*  HCT  --  32.8* 33.5*  PLT  --  118* 102*  LABPROT 26.3* 25.4* 25.5*  INR 2.39* 2.29* 2.30*  CREATININE 8.00* 4.33* 4.72*  TROPONINI <0.30 <0.30  --     Estimated Creatinine Clearance: 12.9 mL/min (by C-G formula based on Cr of 4.72).   Medical History: Past Medical History  Diagnosis Date  . 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 Chronic back pain   . CAD (coronary artery disease)   . Obesity   . Peripheral vascular disease hands and legs  . Anxiety   . Asthma   . GERD (gastroesophageal reflux disease)     takes Omeprazole daily  . Chronic constipation     takes MIralax and Colace daily  . Anemia     takes Folic Acid daily  . Hypothyroidism     takes Synthroid daily  . Depression     takes Cymbalta for "severe" depression  . Neuromuscular disorder     carpal tunnel in right hand  . Hypertension     takes Metoprolol and Lisinopril daily, sees Dr Bea Graff  . COPD (chronic obstructive pulmonary disease)   . Pneumonia     "today and several times before" (08/30/2012)  . Chronic bronchitis     "q yr w/season changes" (08/30/2012)  . Type II diabetes mellitus     Levemir 2units daily if > 150  . History of blood transfusion     "several" (08/30/2012)  .  Hepatitis C   . Migraine     "last migraine was in 2013" (08/30/2012)  . Headache     "at least monthly" (08/30/2012)  . Arthritis     "left hand, back" (08/30/2012)  . End stage renal disease on dialysis 02/27/2011    Kidneys shut down at time of liver transplant in Sept 2011 at [facility] in [place], she has been on HD ever since.  Dialyzes at [facility] HD on TTS schedule.  Had L forearm graft used 10 months then removed Dec 2012 due to suspected infection.  A right upper arm AVG was placed Dec 2012 but she developed steal symptoms acutely and it was ligated the same day.  Never had an AV fistula due to small veins.  Now has L thigh AVG put in Jan 2013, has not clotted to date.    [name] CAD (coronary artery disease) Jan. 2015    Cath: 20% LAD, 50% D1; s/p LIMA-LAD  . Aortic stenosis   . Tobacco abuse   . S/P aortic valve replacement 03/15/13    Mechanical     Medications:  Scheduled:  . antiseptic oral rinse  7 mL Mouth Rinse BID  . aspirin EC  81 mg  Oral Daily  . calcitRIOL  0.5 mcg Oral QODAY  . cinacalcet  30 mg Oral Q breakfast  . darbepoetin (ARANESP) injection - DIALYSIS  60 mcg Intravenous Q Tue-HD  . insulin aspart  0-15 Units Subcutaneous TID WC  . insulin aspart  0-5 Units Subcutaneous QHS  . insulin aspart  3 Units Subcutaneous TID WC  . levothyroxine  150 mcg Oral QAC breakfast  . metoprolol  25 mg Oral BID  . multivitamin  1 tablet Oral QHS  . sevelamer carbonate  2.4 g Oral BID WC  . sodium chloride  3 mL Intravenous Q12H  . sucroferric oxyhydroxide  500 mg Oral TID WC  . tacrolimus  3 mg Oral BID  . Warfarin - Physician Dosing Inpatient   Does not apply q1800    Assessment: 89 yoF with ESRD oh HD qTTS and history of aortic valve replacement on coumadin prior to arrival.  According to [facility]'s notes, her goal INR is 2.5 to 3.  Her home dose was recently decreased on 11/18 to 3 mg daily due to a supratherapeutic INR of 5 outpatient.    On 01/03/14 pharmacy consulted to  manage warfarin while inpatient.  Her INR on admission was 2.39.  She received 4 mg X 1 and INR trended down to 2.29 yesterday. Today the INR is 2.3.  Unclear if patient was compliant with warfarin prior to coming into the hospital as INR trending down. No bleeding noted.  Patient was dialyzed today with plan to discharge patient later today.      Goal of Therapy:  INR 2.5-3 Monitor platelets by anticoagulation protocol: Yes   Plan:  Warfarin 4 mg X 1 here tonight if patient not discharged today. Follow daily INR, signs of bleeding   Nicole Cella, RPh Clinical Pharmacist Pager: [phone] 11/25/20152:58 PM

## 2014-01-04 NOTE — Procedures (Signed)
I have personally attended this patient's dialysis session.   HD again today to get on holiday schedule in preparation for discharge later today. UF goal 2L 2K bath (K 4.7) No heparin  Jamal Maes, MD [facility] [phone] Pager 01/04/2014, 2:36 PM

## 2014-01-04 NOTE — Progress Notes (Addendum)
[facility] Rounding Note Subjective:  No complaints, breathing much better, would like to go home  Objective: Vital signs in last 24 hours: Temp:  [97.7 F (36.5 C)-99.1 F (37.3 C)] 97.7 F (36.5 C) (11/25 0950) Pulse Rate:  [67-79] 69 (11/25 0950) Resp:  [15-21] 18 (11/25 0950) BP: (110-164)/(35-67) 164/43 mmHg (11/25 0950) SpO2:  [93 %-100 %] 98 % (11/25 0950) Weight:  [76.5 kg (168 lb 10.4 oz)-80.2 kg (176 lb 12.9 oz)] 76.5 kg (168 lb 10.4 oz) (11/24 2229) Weight change: -0.4 kg (-14.1 oz)  Intake/Output from previous day: 11/24 0701 - 11/25 0700 In: 263 [P.O.:240; I.V.:23] Out: 3003  Intake/Output this shift:   EXAM: General appearance:  Alert, in no apparent distress Resp:  CTA without rales, rhonchi, or wheezes Cardio:  RRR with Gr I/VI systolic murmur, no rub GI:  + BS, soft and nontender Extremities:  No edema Access:  L thigh AVG with + bruit  Lab Results:  Recent Labs  01/03/14 0400 01/04/14 0539  WBC 8.0 5.2  HGB 10.4* 10.3*  HCT 32.8* 33.5*  PLT 118* 102*   BMET:  Recent Labs  01/03/14 0400 01/04/14 0539  NA 139 141  K 4.0 4.7  CL 96 99  CO2 26 27  GLUCOSE 120* 149*  BUN 25* 25*  CREATININE 4.33* 4.72*  CALCIUM 7.6* 9.3  ALBUMIN 2.7* 2.7*   Studies/Results: Dg Chest Port 1 View  01/03/2014   CLINICAL DATA:  Shortness of breath.  EXAM: PORTABLE CHEST - 1 VIEW  COMPARISON:  01/02/2014, 12/15/2013.  FINDINGS: Mediastinum hilar structures are unremarkable. Prior median sternotomy. Stable cardiomegaly. Interim partial clearing of bilateral pulmonary edema with moderate residual. Small left pleural effusion. Right cardiophrenic angle not imaged. No pneumothorax. No acute bony abnormality.  IMPRESSION: Interim partial clearing of bilateral pulmonary edema with moderate residual. Small left pleural effusion.   Electronically Signed   By: Marcello Moores  Register   On: 01/03/2014 07:49   Scheduled Medications . antiseptic oral rinse  7 mL  Mouth Rinse BID  . aspirin EC  81 mg Oral Daily  . calcitRIOL  0.5 mcg Oral QODAY  . cinacalcet  30 mg Oral Q breakfast  . darbepoetin (ARANESP) injection - DIALYSIS  60 mcg Intravenous Q Tue-HD  . insulin aspart  0-15 Units Subcutaneous TID WC  . insulin aspart  0-5 Units Subcutaneous QHS  . insulin aspart  3 Units Subcutaneous TID WC  . levothyroxine  150 mcg Oral QAC breakfast  . metoprolol  25 mg Oral BID  . multivitamin  1 tablet Oral QHS  . sevelamer carbonate  2.4 g Oral BID WC  . sodium chloride  3 mL Intravenous Q12H  . sucroferric oxyhydroxide  500 mg Oral TID WC  . tacrolimus  3 mg Oral BID  . Warfarin - Physician Dosing Inpatient   Does not apply q1800   HD TTS [place] 3'45" 180 400/A1.5 EDW 76.5 kg 2K2.5Ca Left thigh AVG No heparin Aranesp 50/week Calcitriol 0.5 TIW  Assessment/Plan: 1. Pulmonary edema - per chest x-rays 11/23 & 11/24, now @ EDW s/p net UF 3 L yesterday, 1.6 L off 11/23, improved. 2. Abdominal pain - likely trauma sec to fall in bathtub at home last week. 3. ESRD - HD on TTS @ [facility], K 4.7.  HD for 3 hrs today to stay on schedule. 4. HTN - BP 164/43, Metoprolol reduced to 25 mg bid. 5. Anemia - Hgb 10.3, Aranesp 60 mcg on Tues. 6. Sec HPT -  Ca Calcitriol 0.5 mcg, Sensipar 30 mg qd, Renvela 2.4 g powder & Velphoro with meals. 7. Hx liver transplant - on TAC, mild transaminase elevations. 8. CAD - s/p CABG & AVR 03/2013, last echo 04/2013 with EF 55%. ECHO this admission 01/03/14 EF 55-60 9. Fall - last week, with large left upper arm hematoma, L side and flank pain, better. 10. Hypothyroidism - on replacement. 11. Fever - low grade on admission, WBCs normal. 12. COPD 13. DM Type 2   LOS: 2 days   LYLES,CHARLES 01/04/2014,10:05 AM I have seen and examined this patient and agree with plan and assessment in the note above. Patient will need HD again today to stay on "holiday" schedule as her next outpt treatment would not be until Saturday. From a  renal perspective could be discharged top home after HD.   Yaslyn Cumby B,MD 01/04/2014 11:14 AM   Addendum: pre HD weight 77.1  EDW 76.5.  Goal today 2 liters. Use post HD weight today as new (lower) EDW  Jamal Maes, MD [facility] [phone] Pager 01/04/2014, 2:56 PM   .

## 2014-01-09 DIAGNOSIS — Z992 Dependence on renal dialysis: Secondary | ICD-10-CM | POA: Diagnosis not present

## 2014-01-09 DIAGNOSIS — N186 End stage renal disease: Secondary | ICD-10-CM | POA: Diagnosis not present

## 2014-01-10 DIAGNOSIS — E118 Type 2 diabetes mellitus with unspecified complications: Secondary | ICD-10-CM | POA: Diagnosis not present

## 2014-01-10 DIAGNOSIS — N186 End stage renal disease: Secondary | ICD-10-CM | POA: Diagnosis not present

## 2014-01-10 DIAGNOSIS — D631 Anemia in chronic kidney disease: Secondary | ICD-10-CM | POA: Diagnosis not present

## 2014-01-10 DIAGNOSIS — N2581 Secondary hyperparathyroidism of renal origin: Secondary | ICD-10-CM | POA: Diagnosis not present

## 2014-01-16 ENCOUNTER — Other Ambulatory Visit: Payer: Self-pay | Admitting: Internal Medicine

## 2014-01-18 ENCOUNTER — Encounter: Payer: Self-pay | Admitting: *Deleted

## 2014-01-19 ENCOUNTER — Encounter (HOSPITAL_COMMUNITY): Payer: Self-pay | Admitting: Cardiovascular Disease

## 2014-01-24 DIAGNOSIS — I359 Nonrheumatic aortic valve disorder, unspecified: Secondary | ICD-10-CM | POA: Diagnosis not present

## 2014-01-25 LAB — PROTIME-INR: INR: 1.6 — AB (ref 0.9–1.1)

## 2014-01-26 ENCOUNTER — Ambulatory Visit (INDEPENDENT_AMBULATORY_CARE_PROVIDER_SITE_OTHER): Payer: Medicare Other | Admitting: Pharmacist Clinician (PhC)/ Clinical Pharmacy Specialist

## 2014-01-26 DIAGNOSIS — Z954 Presence of other heart-valve replacement: Secondary | ICD-10-CM

## 2014-01-26 DIAGNOSIS — Z952 Presence of prosthetic heart valve: Secondary | ICD-10-CM

## 2014-01-26 DIAGNOSIS — I359 Nonrheumatic aortic valve disorder, unspecified: Secondary | ICD-10-CM | POA: Diagnosis not present

## 2014-01-30 DIAGNOSIS — Z944 Liver transplant status: Secondary | ICD-10-CM | POA: Diagnosis not present

## 2014-01-30 DIAGNOSIS — Z79899 Other long term (current) drug therapy: Secondary | ICD-10-CM | POA: Diagnosis not present

## 2014-01-30 DIAGNOSIS — K769 Liver disease, unspecified: Secondary | ICD-10-CM | POA: Diagnosis not present

## 2014-02-09 DIAGNOSIS — Z992 Dependence on renal dialysis: Secondary | ICD-10-CM | POA: Diagnosis not present

## 2014-02-09 DIAGNOSIS — N186 End stage renal disease: Secondary | ICD-10-CM | POA: Diagnosis not present

## 2014-02-11 DIAGNOSIS — N186 End stage renal disease: Secondary | ICD-10-CM | POA: Diagnosis not present

## 2014-02-11 DIAGNOSIS — D631 Anemia in chronic kidney disease: Secondary | ICD-10-CM | POA: Diagnosis not present

## 2014-02-11 DIAGNOSIS — N2581 Secondary hyperparathyroidism of renal origin: Secondary | ICD-10-CM | POA: Diagnosis not present

## 2014-02-11 DIAGNOSIS — E118 Type 2 diabetes mellitus with unspecified complications: Secondary | ICD-10-CM | POA: Diagnosis not present

## 2014-02-19 ENCOUNTER — Emergency Department (HOSPITAL_COMMUNITY): Payer: Medicare Other

## 2014-02-19 ENCOUNTER — Encounter (HOSPITAL_COMMUNITY): Payer: Self-pay | Admitting: Emergency Medicine

## 2014-02-19 ENCOUNTER — Inpatient Hospital Stay (HOSPITAL_COMMUNITY)
Admission: EM | Admit: 2014-02-19 | Discharge: 2014-02-24 | DRG: 308 | Disposition: A | Discharge status: Home / Self Care | Payer: Medicare Other | Attending: Oncology | Admitting: Oncology

## 2014-02-19 DIAGNOSIS — F329 Major depressive disorder, single episode, unspecified: Secondary | ICD-10-CM | POA: Diagnosis present

## 2014-02-19 DIAGNOSIS — Z952 Presence of prosthetic heart valve: Secondary | ICD-10-CM

## 2014-02-19 DIAGNOSIS — E1122 Type 2 diabetes mellitus with diabetic chronic kidney disease: Secondary | ICD-10-CM

## 2014-02-19 DIAGNOSIS — E039 Hypothyroidism, unspecified: Secondary | ICD-10-CM | POA: Diagnosis present

## 2014-02-19 DIAGNOSIS — Z7982 Long term (current) use of aspirin: Secondary | ICD-10-CM

## 2014-02-19 DIAGNOSIS — I739 Peripheral vascular disease, unspecified: Secondary | ICD-10-CM | POA: Diagnosis present

## 2014-02-19 DIAGNOSIS — F419 Anxiety disorder, unspecified: Secondary | ICD-10-CM | POA: Diagnosis present

## 2014-02-19 DIAGNOSIS — I120 Hypertensive chronic kidney disease with stage 5 chronic kidney disease or end stage renal disease: Secondary | ICD-10-CM | POA: Diagnosis present

## 2014-02-19 DIAGNOSIS — Z886 Allergy status to analgesic agent status: Secondary | ICD-10-CM | POA: Diagnosis not present

## 2014-02-19 DIAGNOSIS — Z885 Allergy status to narcotic agent status: Secondary | ICD-10-CM

## 2014-02-19 DIAGNOSIS — E119 Type 2 diabetes mellitus without complications: Secondary | ICD-10-CM | POA: Diagnosis present

## 2014-02-19 DIAGNOSIS — K7460 Unspecified cirrhosis of liver: Secondary | ICD-10-CM | POA: Diagnosis present

## 2014-02-19 DIAGNOSIS — R1110 Vomiting, unspecified: Secondary | ICD-10-CM | POA: Diagnosis not present

## 2014-02-19 DIAGNOSIS — I059 Rheumatic mitral valve disease, unspecified: Secondary | ICD-10-CM | POA: Diagnosis not present

## 2014-02-19 DIAGNOSIS — I509 Heart failure, unspecified: Secondary | ICD-10-CM | POA: Diagnosis not present

## 2014-02-19 DIAGNOSIS — N186 End stage renal disease: Secondary | ICD-10-CM | POA: Diagnosis present

## 2014-02-19 DIAGNOSIS — I5032 Chronic diastolic (congestive) heart failure: Secondary | ICD-10-CM | POA: Diagnosis present

## 2014-02-19 DIAGNOSIS — B182 Chronic viral hepatitis C: Secondary | ICD-10-CM | POA: Diagnosis present

## 2014-02-19 DIAGNOSIS — Z7901 Long term (current) use of anticoagulants: Secondary | ICD-10-CM | POA: Diagnosis not present

## 2014-02-19 DIAGNOSIS — I132 Hypertensive heart and chronic kidney disease with heart failure and with stage 5 chronic kidney disease, or end stage renal disease: Secondary | ICD-10-CM | POA: Diagnosis not present

## 2014-02-19 DIAGNOSIS — K5900 Constipation, unspecified: Secondary | ICD-10-CM | POA: Diagnosis present

## 2014-02-19 DIAGNOSIS — K219 Gastro-esophageal reflux disease without esophagitis: Secondary | ICD-10-CM | POA: Diagnosis present

## 2014-02-19 DIAGNOSIS — J449 Chronic obstructive pulmonary disease, unspecified: Secondary | ICD-10-CM | POA: Diagnosis present

## 2014-02-19 DIAGNOSIS — E118 Type 2 diabetes mellitus with unspecified complications: Secondary | ICD-10-CM

## 2014-02-19 DIAGNOSIS — I471 Supraventricular tachycardia: Principal | ICD-10-CM | POA: Diagnosis present

## 2014-02-19 DIAGNOSIS — R079 Chest pain, unspecified: Secondary | ICD-10-CM

## 2014-02-19 DIAGNOSIS — E875 Hyperkalemia: Secondary | ICD-10-CM | POA: Diagnosis present

## 2014-02-19 DIAGNOSIS — R0789 Other chest pain: Secondary | ICD-10-CM

## 2014-02-19 DIAGNOSIS — M1990 Unspecified osteoarthritis, unspecified site: Secondary | ICD-10-CM | POA: Diagnosis present

## 2014-02-19 DIAGNOSIS — Z888 Allergy status to other drugs, medicaments and biological substances status: Secondary | ICD-10-CM | POA: Diagnosis not present

## 2014-02-19 DIAGNOSIS — I350 Nonrheumatic aortic (valve) stenosis: Secondary | ICD-10-CM | POA: Diagnosis present

## 2014-02-19 DIAGNOSIS — E785 Hyperlipidemia, unspecified: Secondary | ICD-10-CM | POA: Diagnosis present

## 2014-02-19 DIAGNOSIS — M5412 Radiculopathy, cervical region: Secondary | ICD-10-CM | POA: Diagnosis present

## 2014-02-19 DIAGNOSIS — D649 Anemia, unspecified: Secondary | ICD-10-CM | POA: Diagnosis present

## 2014-02-19 DIAGNOSIS — E1129 Type 2 diabetes mellitus with other diabetic kidney complication: Secondary | ICD-10-CM | POA: Diagnosis not present

## 2014-02-19 DIAGNOSIS — R200 Anesthesia of skin: Secondary | ICD-10-CM | POA: Diagnosis present

## 2014-02-19 DIAGNOSIS — G43909 Migraine, unspecified, not intractable, without status migrainosus: Secondary | ICD-10-CM | POA: Diagnosis present

## 2014-02-19 DIAGNOSIS — R002 Palpitations: Secondary | ICD-10-CM | POA: Diagnosis not present

## 2014-02-19 DIAGNOSIS — Z944 Liver transplant status: Secondary | ICD-10-CM | POA: Insufficient documentation

## 2014-02-19 DIAGNOSIS — Z794 Long term (current) use of insulin: Secondary | ICD-10-CM | POA: Diagnosis not present

## 2014-02-19 DIAGNOSIS — I351 Nonrheumatic aortic (valve) insufficiency: Secondary | ICD-10-CM | POA: Diagnosis present

## 2014-02-19 DIAGNOSIS — I953 Hypotension of hemodialysis: Secondary | ICD-10-CM | POA: Diagnosis present

## 2014-02-19 DIAGNOSIS — Z951 Presence of aortocoronary bypass graft: Secondary | ICD-10-CM

## 2014-02-19 DIAGNOSIS — Z9049 Acquired absence of other specified parts of digestive tract: Secondary | ICD-10-CM | POA: Diagnosis present

## 2014-02-19 DIAGNOSIS — R0602 Shortness of breath: Secondary | ICD-10-CM | POA: Diagnosis not present

## 2014-02-19 DIAGNOSIS — J45909 Unspecified asthma, uncomplicated: Secondary | ICD-10-CM | POA: Diagnosis present

## 2014-02-19 DIAGNOSIS — F17210 Nicotine dependence, cigarettes, uncomplicated: Secondary | ICD-10-CM | POA: Diagnosis present

## 2014-02-19 DIAGNOSIS — Z954 Presence of other heart-valve replacement: Secondary | ICD-10-CM | POA: Diagnosis not present

## 2014-02-19 DIAGNOSIS — Z992 Dependence on renal dialysis: Secondary | ICD-10-CM

## 2014-02-19 DIAGNOSIS — I2510 Atherosclerotic heart disease of native coronary artery without angina pectoris: Secondary | ICD-10-CM | POA: Diagnosis present

## 2014-02-19 DIAGNOSIS — R112 Nausea with vomiting, unspecified: Secondary | ICD-10-CM | POA: Diagnosis present

## 2014-02-19 LAB — TROPONIN I
TROPONIN I: 0.04 ng/mL — AB (ref <0.031)
TROPONIN I: 0.05 ng/mL — AB (ref <0.031)
Troponin I: 0.04 ng/mL — ABNORMAL HIGH (ref <0.031)
Troponin I: 0.04 ng/mL — ABNORMAL HIGH (ref <0.031)

## 2014-02-19 LAB — CBC WITH DIFFERENTIAL/PLATELET
BASOS PCT: 1 % (ref 0–1)
Basophils Absolute: 0 10*3/uL (ref 0.0–0.1)
EOS ABS: 0.1 10*3/uL (ref 0.0–0.7)
Eosinophils Relative: 2 % (ref 0–5)
HCT: 39.6 % (ref 36.0–46.0)
Hemoglobin: 12.8 g/dL (ref 12.0–15.0)
LYMPHS ABS: 1.3 10*3/uL (ref 0.7–4.0)
Lymphocytes Relative: 32 % (ref 12–46)
MCH: 31.8 pg (ref 26.0–34.0)
MCHC: 32.3 g/dL (ref 30.0–36.0)
MCV: 98.3 fL (ref 78.0–100.0)
Monocytes Absolute: 0.5 10*3/uL (ref 0.1–1.0)
Monocytes Relative: 12 % (ref 3–12)
NEUTROS ABS: 2.2 10*3/uL (ref 1.7–7.7)
Neutrophils Relative %: 53 % (ref 43–77)
PLATELETS: 103 10*3/uL — AB (ref 150–400)
RBC: 4.03 MIL/uL (ref 3.87–5.11)
RDW: 13.8 % (ref 11.5–15.5)
WBC: 4.1 10*3/uL (ref 4.0–10.5)

## 2014-02-19 LAB — TSH: TSH: 2.242 u[IU]/mL (ref 0.350–4.500)

## 2014-02-19 LAB — COMPREHENSIVE METABOLIC PANEL
ALK PHOS: 82 U/L (ref 39–117)
ALT: 33 U/L (ref 0–35)
AST: 52 U/L — ABNORMAL HIGH (ref 0–37)
Albumin: 2.8 g/dL — ABNORMAL LOW (ref 3.5–5.2)
Anion gap: 16 — ABNORMAL HIGH (ref 5–15)
BUN: 23 mg/dL (ref 6–23)
CO2: 28 mmol/L (ref 19–32)
Calcium: 8.3 mg/dL — ABNORMAL LOW (ref 8.4–10.5)
Chloride: 93 mEq/L — ABNORMAL LOW (ref 96–112)
Creatinine, Ser: 6.46 mg/dL — ABNORMAL HIGH (ref 0.50–1.10)
GFR calc Af Amer: 7 mL/min — ABNORMAL LOW (ref >90)
GFR calc non Af Amer: 6 mL/min — ABNORMAL LOW (ref >90)
Glucose, Bld: 160 mg/dL — ABNORMAL HIGH (ref 70–99)
POTASSIUM: 3.8 mmol/L (ref 3.5–5.1)
SODIUM: 137 mmol/L (ref 135–145)
Total Bilirubin: 0.6 mg/dL (ref 0.3–1.2)
Total Protein: 6.9 g/dL (ref 6.0–8.3)

## 2014-02-19 LAB — HEPARIN LEVEL (UNFRACTIONATED): HEPARIN UNFRACTIONATED: 0.43 [IU]/mL (ref 0.30–0.70)

## 2014-02-19 LAB — MAGNESIUM: Magnesium: 2.2 mg/dL (ref 1.5–2.5)

## 2014-02-19 LAB — CBG MONITORING, ED
GLUCOSE-CAPILLARY: 269 mg/dL — AB (ref 70–99)
Glucose-Capillary: 138 mg/dL — ABNORMAL HIGH (ref 70–99)

## 2014-02-19 LAB — GLUCOSE, CAPILLARY
Glucose-Capillary: 137 mg/dL — ABNORMAL HIGH (ref 70–99)
Glucose-Capillary: 116 mg/dL — ABNORMAL HIGH (ref 70–99)

## 2014-02-19 LAB — PROTIME-INR
INR: 1.23 (ref 0.00–1.49)
Prothrombin Time: 15.6 seconds — ABNORMAL HIGH (ref 11.6–15.2)

## 2014-02-19 MED ORDER — METOPROLOL TARTRATE 50 MG PO TABS
50.0000 mg | ORAL_TABLET | Freq: Two times a day (BID) | ORAL | Status: DC
  Administered 2014-02-19 – 2014-02-24 (×11): 50 mg via ORAL
  Filled 2014-02-19 (×8): qty 1
  Filled 2014-02-19: qty 2
  Filled 2014-02-19 (×3): qty 1

## 2014-02-19 MED ORDER — HEPARIN (PORCINE) IN NACL 100-0.45 UNIT/ML-% IJ SOLN
1000.0000 [IU]/h | INTRAMUSCULAR | Status: DC
  Administered 2014-02-19 – 2014-02-21 (×3): 1000 [IU]/h via INTRAVENOUS
  Filled 2014-02-19 (×6): qty 250

## 2014-02-19 MED ORDER — ASPIRIN EC 81 MG PO TBEC
81.0000 mg | DELAYED_RELEASE_TABLET | Freq: Every day | ORAL | Status: DC
  Administered 2014-02-20 – 2014-02-24 (×5): 81 mg via ORAL
  Filled 2014-02-19 (×5): qty 1

## 2014-02-19 MED ORDER — LORAZEPAM 0.5 MG PO TABS: 0.5000 mg | ORAL_TABLET | Freq: Every evening | ORAL | Status: DC | PRN

## 2014-02-19 MED ORDER — CINACALCET HCL 30 MG PO TABS
30.0000 mg | ORAL_TABLET | Freq: Every day | ORAL | Status: DC
  Administered 2014-02-19 – 2014-02-24 (×6): 30 mg via ORAL
  Filled 2014-02-19 (×6): qty 1

## 2014-02-19 MED ORDER — PROMETHAZINE HCL 25 MG/ML IJ SOLN
12.5000 mg | Freq: Once | INTRAMUSCULAR | Status: AC
  Administered 2014-02-19: 12.5 mg via INTRAVENOUS
  Filled 2014-02-19: qty 1

## 2014-02-19 MED ORDER — SODIUM CHLORIDE 0.9 % IJ SOLN
3.0000 mL | Freq: Two times a day (BID) | INTRAMUSCULAR | Status: DC
  Administered 2014-02-19 – 2014-02-24 (×7): 3 mL via INTRAVENOUS
  Filled 2014-02-19: qty 3

## 2014-02-19 MED ORDER — INSULIN ASPART 100 UNIT/ML ~~LOC~~ SOLN
0.0000 [IU] | Freq: Three times a day (TID) | SUBCUTANEOUS | Status: DC
  Administered 2014-02-19 – 2014-02-20 (×3): 1 [IU] via SUBCUTANEOUS
  Administered 2014-02-21: 2 [IU] via SUBCUTANEOUS
  Administered 2014-02-22 – 2014-02-24 (×3): 1 [IU] via SUBCUTANEOUS
  Filled 2014-02-19: qty 1

## 2014-02-19 MED ORDER — ONDANSETRON 4 MG PO TBDP
8.0000 mg | ORAL_TABLET | Freq: Once | ORAL | Status: AC
  Administered 2014-02-19: 8 mg via ORAL
  Filled 2014-02-19: qty 2

## 2014-02-19 MED ORDER — LEVOTHYROXINE SODIUM 150 MCG PO TABS
150.0000 ug | ORAL_TABLET | Freq: Every day | ORAL | Status: DC
  Administered 2014-02-20 – 2014-02-24 (×4): 150 ug via ORAL
  Filled 2014-02-19 (×8): qty 1

## 2014-02-19 MED ORDER — PROMETHAZINE HCL 12.5 MG PO TABS: 12.5000 mg | ORAL_TABLET | ORAL | Status: DC | PRN

## 2014-02-19 MED ORDER — ASPIRIN 325 MG PO TABS
325.0000 mg | ORAL_TABLET | Freq: Once | ORAL | Status: DC
  Filled 2014-02-19 (×2): qty 1

## 2014-02-19 MED ORDER — WARFARIN SODIUM 5 MG PO TABS
5.0000 mg | ORAL_TABLET | Freq: Once | ORAL | Status: AC
  Administered 2014-02-19: 5 mg via ORAL
  Filled 2014-02-19 (×2): qty 1

## 2014-02-19 MED ORDER — TRAMADOL HCL 50 MG PO TABS
50.0000 mg | ORAL_TABLET | Freq: Two times a day (BID) | ORAL | Status: DC | PRN
  Administered 2014-02-20 – 2014-02-22 (×4): 50 mg via ORAL
  Filled 2014-02-19 (×3): qty 1

## 2014-02-19 MED ORDER — ALBUTEROL 90 MCG/ACT IN AERS: 1.0000 | INHALATION_SPRAY | Freq: Four times a day (QID) | RESPIRATORY_TRACT | Status: DC | PRN

## 2014-02-19 MED ORDER — METOPROLOL TARTRATE 1 MG/ML IV SOLN
5.0000 mg | Freq: Once | INTRAVENOUS | Status: AC
  Administered 2014-02-19: 5 mg via INTRAVENOUS
  Filled 2014-02-19: qty 5

## 2014-02-19 MED ORDER — GABAPENTIN 300 MG PO CAPS
300.0000 mg | ORAL_CAPSULE | Freq: Every day | ORAL | Status: DC
  Administered 2014-02-19 – 2014-02-23 (×5): 300 mg via ORAL
  Filled 2014-02-19 (×6): qty 1

## 2014-02-19 MED ORDER — ADENOSINE 6 MG/2ML IV SOLN
6.0000 mg | Freq: Once | INTRAVENOUS | Status: DC
  Filled 2014-02-19: qty 2

## 2014-02-19 MED ORDER — PROMETHAZINE HCL 25 MG/ML IJ SOLN
12.5000 mg | Freq: Four times a day (QID) | INTRAMUSCULAR | Status: DC | PRN
  Administered 2014-02-22: 12.5 mg via INTRAVENOUS
  Filled 2014-02-19 (×2): qty 1

## 2014-02-19 MED ORDER — POLYETHYLENE GLYCOL 3350 17 G PO PACK
17.0000 g | PACK | Freq: Every day | ORAL | Status: DC | PRN
  Filled 2014-02-19: qty 1

## 2014-02-19 MED ORDER — ONDANSETRON HCL 4 MG/2ML IJ SOLN
4.0000 mg | INTRAMUSCULAR | Status: AC
  Administered 2014-02-19: 4 mg via INTRAMUSCULAR
  Filled 2014-02-19: qty 2

## 2014-02-19 MED ORDER — ALBUTEROL SULFATE (2.5 MG/3ML) 0.083% IN NEBU: 2.5000 mg | INHALATION_SOLUTION | Freq: Four times a day (QID) | RESPIRATORY_TRACT | Status: DC | PRN

## 2014-02-19 MED ORDER — CALCIUM CARBONATE ANTACID 500 MG PO CHEW
1.0000 | CHEWABLE_TABLET | Freq: Every day | ORAL | Status: DC
  Administered 2014-02-19: 400 mg via ORAL
  Administered 2014-02-20: 200 mg via ORAL
  Administered 2014-02-21: 400 mg via ORAL
  Administered 2014-02-22 – 2014-02-24 (×3): 200 mg via ORAL
  Filled 2014-02-19 (×6): qty 2

## 2014-02-19 MED ORDER — METOCLOPRAMIDE HCL 5 MG/ML IJ SOLN
10.0000 mg | Freq: Once | INTRAMUSCULAR | Status: AC
  Administered 2014-02-19: 10 mg via INTRAVENOUS
  Filled 2014-02-19: qty 2

## 2014-02-19 MED ORDER — GI COCKTAIL ~~LOC~~: 30.0000 mL | Freq: Once | ORAL | Status: DC

## 2014-02-19 MED ORDER — TACROLIMUS 1 MG PO CAPS
3.0000 mg | ORAL_CAPSULE | Freq: Two times a day (BID) | ORAL | Status: DC
  Administered 2014-02-19 – 2014-02-24 (×11): 3 mg via ORAL
  Filled 2014-02-19 (×16): qty 3

## 2014-02-19 MED ORDER — CALCIUM ACETATE 667 MG PO CAPS
2001.0000 mg | ORAL_CAPSULE | Freq: Three times a day (TID) | ORAL | Status: DC
  Administered 2014-02-19 – 2014-02-24 (×14): 2001 mg via ORAL
  Filled 2014-02-19 (×20): qty 3

## 2014-02-19 MED ORDER — WARFARIN - PHARMACIST DOSING INPATIENT
Freq: Every day | Status: DC
  Administered 2014-02-20 – 2014-02-23 (×4)

## 2014-02-19 MED ORDER — HYDROMORPHONE HCL 1 MG/ML IJ SOLN
0.5000 mg | Freq: Once | INTRAMUSCULAR | Status: AC
  Administered 2014-02-19: 0.5 mg via INTRAVENOUS
  Filled 2014-02-19: qty 1

## 2014-02-19 MED ORDER — HEPARIN BOLUS VIA INFUSION
3000.0000 [IU] | Freq: Once | INTRAVENOUS | Status: AC
  Administered 2014-02-19: 3000 [IU] via INTRAVENOUS
  Filled 2014-02-19: qty 3000

## 2014-02-19 MED ORDER — HYDROMORPHONE HCL 1 MG/ML IJ SOLN
1.0000 mg | Freq: Once | INTRAMUSCULAR | Status: AC
  Administered 2014-02-19: 1 mg via INTRAMUSCULAR
  Filled 2014-02-19: qty 1

## 2014-02-19 NOTE — Progress Notes (Signed)
ANTICOAGULATION CONSULT NOTE - Initial Consult  Pharmacy Consult for warfarin and heparin Indication: Mechanical heart valve - AVR  Allergies  Allergen Reactions  . Acetaminophen Other (See Comments)    Liver transplant recipient   . Codeine Itching  . Mirtazapine Other (See Comments)    hallucination  . Morphine Itching    Patient Measurements: Height: 5\' 4"  (162.6 cm) Weight: 165 lb (74.844 kg) IBW/kg (Calculated) : 54.7 Heparin Dosing Weight: 70 kg  Vital Signs: Temp: 97.6 F (36.4 C) (01/10 0436) Temp Source: Oral (01/10 0436) BP: 195/41 mmHg (01/10 0812) Pulse Rate: 57 (01/10 0812)  Labs:  Recent Labs  02/19/14 0243 02/19/14 0537 02/19/14 0700  HGB 12.8  --   --   HCT 39.6  --   --   PLT 103*  --   --   LABPROT  --   --  15.6*  INR  --   --  1.23  CREATININE 6.46*  --   --   TROPONINI 0.04* 0.04*  --     Estimated Creatinine Clearance: 9.3 mL/min (by C-G formula based on Cr of 6.46).   Medical History: Past Medical History  Diagnosis Date  . 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 Chronic back pain   . CAD (coronary artery disease)   . Obesity   . Peripheral vascular disease hands and legs  . Anxiety   . Asthma   . GERD (gastroesophageal reflux disease)     takes Omeprazole daily  . Chronic constipation     takes MIralax and Colace daily  . Anemia     takes Folic Acid daily  . Hypothyroidism     takes Synthroid daily  . Depression     takes Cymbalta for "severe" depression  . Neuromuscular disorder     carpal tunnel in right hand  . Hypertension     takes Metoprolol and Lisinopril daily, sees Dr Bea Graff  . COPD (chronic obstructive pulmonary disease)   . Pneumonia     "today and several times before" (08/30/2012)  . Chronic bronchitis     "q yr w/season changes" (08/30/2012)  . Type II diabetes mellitus     Levemir 2units daily if > 150  . History of blood  transfusion     "several" (08/30/2012)  . Hepatitis C   . Migraine     "last migraine was in 2013" (08/30/2012)  . Headache     "at least monthly" (08/30/2012)  . Arthritis     "left hand, back" (08/30/2012)  . End stage renal disease on dialysis 02/27/2011    Kidneys shut down at time of liver transplant in Sept 2011 at [facility] in [place], she has been on HD ever since.  Dialyzes at [facility] HD on TTS schedule.  Had L forearm graft used 10 months then removed Dec 2012 due to suspected infection.  A right upper arm AVG was placed Dec 2012 but she developed steal symptoms acutely and it was ligated the same day.  Never had an AV fistula due to small veins.  Now has L thigh AVG put in Jan 2013, has not clotted to date.    [name] CAD (coronary artery disease) Jan. 2015    Cath: 20% LAD, 50% D1; s/p LIMA-LAD  . Aortic stenosis   . Tobacco abuse   . S/P aortic valve replacement 03/15/13  Mechanical     Medications:  See home med list.   Home warfarin dose is 3mg  daily  Assessment: 59 year old woman in warfarin prior to admission for a mechanical AVR. INR on admission is 1.23 which is below her goal of 2.5-3 (per anticoagulation clinic note).  Heparin IV to start as a bridge to her warfarin being therapeutic.  Warfarin also to be continued. Goal of Therapy:  INR 2-3 Heparin level 0.3-0.7 units/ml Monitor platelets by anticoagulation protocol: Yes   Plan:  Give 3000 units bolus x 1 Start heparin infusion at 1000 units/hr Check anti-Xa level in 8 hours and daily while on heparin Continue to monitor H&H and platelets  Warfarin 5mg  po x 1 dose today Daily protimes  Candie Mile 02/19/2014,9:30 AM

## 2014-02-19 NOTE — ED Notes (Signed)
nsr on the monitor

## 2014-02-19 NOTE — H&P (Signed)
Date: 02/19/2014               Patient Name:  [name] MRN: [id number]  DOB: 01-29-1956 Age / Sex: 59 y.o., female   PCP: Charlynn Court, NP         Medical Service: Internal Medicine Teaching Service         Attending Physician: Dr. Annia Belt, MD    First Contact: Dr. Marvel Plan Pager: [phone]  Second Contact: Dr. Ronnald Ramp Pager: [phone]       After Hours (After 5p/  First Contact Pager: [phone]  weekends / holidays): Second Contact Pager: [phone]   Chief Complaint: chest pain  History of Present Illness: [name] is a 59 yo female with PMHx of CAD s/p CABG, AS s/p aortic valve replacement, ESRD on HD TTS, Hepatitis C s/p Liver transplant, T2DM, COPD, HTN, Hypothyroidism, and GERD who presents with complaint of chest pain and palpitations. Patient states at 0000 last night she had a sudden onset of non-radiating substernal chest tightness 10/10 associated with dyspnea, nausea, vomiting and palpitations with a rapid heart rate. She was also diaphoretic, dizzy and weak. Patient called her daughter and then called EMS who brought her to the ED. Original EKG showed sinus rhythm but patient later converted to a narrow complex tachycardia consistent with SVT, rate of 154. Patient was going to receive adenosine, but she converted back to sinus rhythm on her own. Patient has stayed in sinus rhythm, but she continues to have substernal chest tightness, shortness of breath, weakness, nausea with vomiting and dizziness when she sits up. She feels very fatigued and weak. Patient and daughter report that this has been occuring every 2-3 weeks for the past 5 months. Patient's last cath was in November and normal. Patient states she has been taking all of her medications as prescribed without any missed doses. She had dialysis yesterday and became hypotensive, but completed her session. She felt unwell the rest of the day following dialysis.    Meds:  Current Facility-Administered  Medications  Medication Dose Route Frequency Provider Last Rate Last Dose  . adenosine (ADENOCARD) 6 MG/2ML injection 6 mg  6 mg Intravenous Once Hoy Morn, MD   Stopped at 02/19/14 [phone]  . aspirin tablet 325 mg  325 mg Oral Once Hoy Morn, MD      . gi cocktail (Maalox,Lidocaine,Donnatal)  30 mL Oral Once Hoy Morn, MD   Stopped at 02/19/14 [phone]  . promethazine (PHENERGAN) injection 12.5 mg  12.5 mg Intravenous Once Dorie Rank, MD       Current Outpatient Prescriptions  Medication Sig Dispense Refill  . albuterol (PROVENTIL,VENTOLIN) 90 MCG/ACT inhaler Inhale 1-2 puffs into the lungs every 6 (six) hours as needed for wheezing or shortness of breath.     [name] aspirin EC 81 MG tablet Take 81 mg by mouth daily.      . calcium acetate (PHOSLO) 667 MG capsule Take 2,001 mg by mouth 3 (three) times daily with meals.    . calcium carbonate (TUMS - DOSED IN MG ELEMENTAL CALCIUM) 500 MG chewable tablet Chew 1-2 tablets by mouth daily.     . cinacalcet (SENSIPAR) 30 MG tablet Take 30 mg by mouth daily.    [name] gabapentin (NEURONTIN) 300 MG capsule Take 300 mg by mouth at bedtime.     . insulin detemir (LEVEMIR) 100 UNIT/ML injection Inject 2-5 Units into the skin daily as needed (blood). 150-200 do 2 units >200 do  5 units    . levothyroxine (SYNTHROID, LEVOTHROID) 150 MCG tablet Take 150 mcg by mouth daily before breakfast.    . LORazepam (ATIVAN) 0.5 MG tablet Take 0.5 mg by mouth at bedtime as needed for sleep.     . metoprolol (LOPRESSOR) 50 MG tablet On non dialysis days take 1 & 1/2 tablet in the morning and 1 tablet in the evening. On dialysis days take 1 tablet twice daily 180 tablet 3  . ondansetron (ZOFRAN-ODT) 4 MG disintegrating tablet Take 4 mg by mouth every 8 (eight) hours as needed for nausea or vomiting.    . polyethylene glycol (MIRALAX / GLYCOLAX) packet Take 17 g by mouth daily as needed for mild constipation.     . promethazine (PHENERGAN) 12.5 MG tablet Take 12.5 mg by mouth  every 4 (four) hours as needed for nausea.     [name] tacrolimus (PROGRAF) 1 MG capsule Take 3 mg by mouth 2 (two) times daily.     . traMADol (ULTRAM) 50 MG tablet Take 50 mg by mouth every 12 (twelve) hours as needed for moderate pain.     [name] warfarin (COUMADIN) 3 MG tablet Take 3 mg by mouth daily.    . Multiple Vitamin (MULTIVITAMIN WITH MINERALS) TABS tablet Take 1 tablet by mouth at bedtime.     [name] RENVELA 2.4 G PACK Take 2.4 g by mouth 3 (three) times daily with meals.     . VELPHORO 500 MG chewable tablet Chew 1 tablet by mouth 3 (three) times daily with meals.       Allergies: Allergies as of 02/19/2014 - Review Complete 02/19/2014  Allergen Reaction Noted  . Acetaminophen Other (See Comments)   . Codeine Itching   . Mirtazapine Other (See Comments) 12/15/2013  . Morphine Itching 07/21/2013   Past Medical History  Diagnosis Date  . 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 Chronic back pain   . CAD (coronary artery disease)   . Obesity   . Peripheral vascular disease hands and legs  . Anxiety   . Asthma   . GERD (gastroesophageal reflux disease)     takes Omeprazole daily  . Chronic constipation     takes MIralax and Colace daily  . Anemia     takes Folic Acid daily  . Hypothyroidism     takes Synthroid daily  . Depression     takes Cymbalta for "severe" depression  . Neuromuscular disorder     carpal tunnel in right hand  . Hypertension     takes Metoprolol and Lisinopril daily, sees Dr Bea Graff  . COPD (chronic obstructive pulmonary disease)   . Pneumonia     "today and several times before" (08/30/2012)  . Chronic bronchitis     "q yr w/season changes" (08/30/2012)  . Type II diabetes mellitus     Levemir 2units daily if > 150  . History of blood transfusion     "several" (08/30/2012)  . Hepatitis C   . Migraine     "last migraine was in 2013" (08/30/2012)  . Headache     "at least monthly"  (08/30/2012)  . Arthritis     "left hand, back" (08/30/2012)  . End stage renal disease on dialysis 02/27/2011    Kidneys shut down at time of liver transplant in Sept 2011 at [facility] in [place], she has been on HD ever  since.  Dialyzes at [facility] HD on TTS schedule.  Had L forearm graft used 10 months then removed Dec 2012 due to suspected infection.  A right upper arm AVG was placed Dec 2012 but she developed steal symptoms acutely and it was ligated the same day.  Never had an AV fistula due to small veins.  Now has L thigh AVG put in Jan 2013, has not clotted to date.    [name] CAD (coronary artery disease) Jan. 2015    Cath: 20% LAD, 50% D1; s/p LIMA-LAD  . Aortic stenosis   . Tobacco abuse   . S/P aortic valve replacement 03/15/13    Mechanical    Past Surgical History  Procedure Laterality Date  . Liver transplant  10/25/2009    sees Dr Ferol Luz 1 every 6 months, saw last in Dec 2013. [name] Coord [phone]  . Small intestine surgery  90's  . Thrombectomy    . Arteriovenous graft placement Left 10/03/10     forearm  . Avgg removal  12/23/2010    Procedure: REMOVAL OF ARTERIOVENOUS GORETEX GRAFT ([place]);  Surgeon: Elam Dutch, MD;  Location: [place];  Service: Vascular;  Laterality: Left;  procedure started @1736 -6962  . Insertion of dialysis catheter  12/23/2010    Procedure: INSERTION OF DIALYSIS CATHETER;  Surgeon: Elam Dutch, MD;  Location: [place];  Service: Vascular;  Laterality: Right;  Right Internal Jugular 28cm dialysis catheter insertion procedure time 1701-1720   . Cholecystectomy  1993  . Cystoscopy  1990's  . Spinal growth rods  2010    "put 2 metal rods in my back; they had detetriorated" (08/30/2012)  . Av fistula placement  01/29/2011    Procedure: INSERTION OF ARTERIOVENOUS (AV) GORE-TEX GRAFT ARM;  Surgeon: Elam Dutch, MD;  Location: [place];  Service: Vascular;  Laterality: Right;  . Av fistula placement  03/10/2011    Procedure: INSERTION OF  ARTERIOVENOUS (AV) GORE-TEX GRAFT THIGH;  Surgeon: Elam Dutch, MD;  Location: [place];  Service: Vascular;  Laterality: Left;  . Tubal ligation  1990's  . Cardiac catheterization  2014  . Aortic valve replacement N/A 03/15/2013    AVR; Surgeon: Ivin Poot, MD;  Location: [facility] OR; Open Heart Surgery;  2mm[employer] mechanical prosthesis, top hat valve  . Coronary artery bypass graft N/A 03/15/2013    Procedure: CORONARY ARTERY BYPASS GRAFTING (CABG) times one using left internal mammary artery.;  Surgeon: Ivin Poot, MD;  Location: [place];  Service: Open Heart Surgery;  Laterality: N/A;  POSS CABG X 1  . Intraoperative transesophageal echocardiogram N/A 03/15/2013    Procedure: INTRAOPERATIVE TRANSESOPHAGEAL ECHOCARDIOGRAM;  Surgeon: Ivin Poot, MD;  Location: [place];  Service: Open Heart Surgery;  Laterality: N/A;  . Left heart catheterization with coronary/graft angiogram N/A 12/24/2011    Procedure: LEFT HEART CATHETERIZATION WITH Beatrix Fetters;  Surgeon: [name], MD;  Location: [facility] CATH LAB;  Service: Cardiovascular;  Laterality: N/A;  . Left heart catheterization with coronary angiogram N/A 07/29/2012    Procedure: LEFT HEART CATHETERIZATION WITH CORONARY ANGIOGRAM;  Surgeon: Troy Sine, MD;  Location: [facility] CATH LAB;  Service: Cardiovascular;  Laterality: N/A;  . Left heart catheterization with coronary angiogram N/A 03/10/2013    Procedure: LEFT HEART CATHETERIZATION WITH CORONARY ANGIOGRAM;  Surgeon: Troy Sine, MD;  Location: [facility] CATH LAB;  Service: Cardiovascular;  Laterality: N/A;  . Left heart catheterization with coronary/graft angiogram N/A 12/16/2013    Procedure: LEFT  HEART CATHETERIZATION WITH Beatrix Fetters;  Surgeon: Troy Sine, MD;  Location: [facility] CATH LAB;  Service: Cardiovascular;  Laterality: N/A;   Family History  Problem Relation Age of Onset  . Cancer Mother   . Diabetes Mother   . Hypertension Mother   . Stroke Mother   .  Cancer Father   . Anesthesia problems Neg Hx   . Hypotension Neg Hx   . Malignant hyperthermia Neg Hx   . Pseudochol deficiency Neg Hx    History   Social History  . Marital Status: Divorced    Spouse Name: N/A    Number of Children: N/A  . Years of Education: N/A   Occupational History  . Not on file.   Social History Main Topics  . Smoking status: Current Some Day Smoker -- 0.75 packs/day for 40 years    Types: Cigarettes    Last Attempt to Quit: 03/13/2013  . Smokeless tobacco: Never Used     Comment: still smokng 1 or 2 a day  . Alcohol Use: Yes     Comment: 08/30/2012 "last drink was at a wedding July, 2014; had a small glass of wine; never had problems w/alcohol"  . Drug Use: No  . Sexual Activity: Not Currently   Other Topics Concern  . Not on file   Social History Narrative    Review of Systems: General: Admits to diaphoresis. Denies fever, chills, fatigue, change in appetite.  Respiratory: Admits to SOB, and chest tightness. Cardiovascular: Admits to chest pain and palpitations.  Gastrointestinal: Admits to nausea, vomiting, constipation. Denies abdominal pain, diarrhea, blood in stool and abdominal distention.  Genitourinary: Denies dysuria Skin: Denies pallor, rash and wounds.  Neurological: Admits to dizziness, weakness, lightheadedness. Denies numbness,seizures, and syncope, Psychiatric/Behavioral: Denies mood changes, confusion, nervousness, sleep disturbance and agitation.  Physical Exam: Admission vital signs: 98.3, 164/86, 71, 18, 100% on 2 L  Filed Vitals:   02/19/14 0715 02/19/14 0730 02/19/14 0745 02/19/14 0812  BP: 192/64 194/47 214/68 195/41  Pulse: 63 58 92 57  Temp:      TempSrc:      Resp: 15 13 20 18   Height:      Weight:      SpO2: 96% 97% 97% 96%   General: Vital signs reviewed.  Patient is well-developed and well-nourished, in mild distress and cooperative with exam.  HEENT: Alopecia patches on scalp. Neck is supple, no carotid  bruit present.  Cardiovascular: RRR, S1 normal, S2 normal Pulmonary/Chest: Clear to auscultation bilaterally, no wheezes, rales, or rhonchi. Abdominal: Soft, non-tender, non-distended, BS +, large, well-healed surgical scar.  Extremities: AVF in place in left lower extremity on anterior superior thigh with good thrill and bruit. No lower extremity edema bilaterally Skin: Warm, dry and intact. Psychiatric: Normal mood and affect. speech and behavior is normal. Cognition and memory are normal.   Lab results: Basic Metabolic Panel:  Recent Labs  02/19/14 0243  NA 137  K 3.8  CL 93*  CO2 28  GLUCOSE 160*  BUN 23  CREATININE 6.46*  CALCIUM 8.3*   Liver Function Tests:  Recent Labs  02/19/14 0243  AST 52*  ALT 33  ALKPHOS 82  BILITOT 0.6  PROT 6.9  ALBUMIN 2.8*   CBC:  Recent Labs  02/19/14 0243  WBC 4.1  NEUTROABS 2.2  HGB 12.8  HCT 39.6  MCV 98.3  PLT 103*   Cardiac Enzymes:  Recent Labs  02/19/14 0243 02/19/14 0537  TROPONINI 0.04* 0.04*  Coagulation:  Recent Labs  02/19/14 0700  LABPROT 15.6*  INR 1.23   Imaging results:  Dg Chest 2 View  02/19/2014   CLINICAL DATA:  Sharp chest pain with palpitations, headache, and shortness of breath. Currently on warfarin. Smoker. Dialysis patient.  EXAM: CHEST  2 VIEW  COMPARISON:  01/03/2014  FINDINGS: Postoperative changes in the mediastinum, lower lumbar spine, and upper abdomen. Cardiac valve prosthesis. The heart size and mediastinal contours are within normal limits. Both lungs are clear. The visualized skeletal structures are unremarkable. Calcification of the aorta.  IMPRESSION: No active cardiopulmonary disease.   Electronically Signed   By: Lucienne Capers M.D.   On: 02/19/2014 02:58   Other results: EKG: prolonged QT interval, narrow complex tachycardia, consistent with SVT.  Assessment & Plan by Problem: Active Problems:   Palpitations- run of tachycardia after HD, lasted 15-20 minutes  SVT:  Patient presented with sudden onset non-radiating substernal chest tightness 10/10 associated with dyspnea, nausea, vomiting and palpitations with a rapid heart rate. Original EKG showed sinus rhythm but patient later converted to a narrow complex tachycardia consistent with SVT, rate of 154. Patient converted back to sinus rhythm on her own without adenosine. Patient has a history of frequent chest pain with palpitations that occurs every 2-3 weeks for the last several months. Patient is on ASA, Warfarin and metoprolol at home. She has been compliant with her medications. Troponins negative x 2.  -Consult cardiology -ASA 325 mg once -ASA 81 mg daily -GI cocktail -Metoprolol 50 mg BID -Phenergan 12.5 mg Q4H prn -CBC/CMET tomorrow am -Cardiac monitoring -Cardiac diet -EKG tomorrow am -Trend troponins -Magnesium  CAD s/p CABG and Aortic Valve Replacement: Patient had an aortic valve replacement and CABG x 1 with left IMA to LAD on 03/21/13 following a cath in January 2015 which showed mild to moderate coronary disease with small diagonal having 70% stenosis and LAD 50% stenosis and moderately severe aortic valve stenosis. Repeat cath 12/16/13 showed 70% mid-LAD stenosis and normal RCA with patent left internal mammary artery graft to mid LAD. Recommendations were to continue medical therapy. Patient is on ASA, metoprolol, and warfarin at home. INR subtherapeutic at 1.23.  -Coumadin per pharmacy -Heparin per pharmacy -Consult cardiology -ASA 325 mg once -ASA 81 mg daily -Metoprolol 50 mg BID -Repeat EKG tomorrow am  Chronic Grade 2 Diastolic Dysfunction: Echo on 01/03/14 showed an EF of 55-60% and grade 2 diastolic dysfunction. Patient is on ASA 81 mg daily and metoprolol at home.  -ASA 81 mg daily -Metoprolol 50 mg BID -Daily weights -I/Os  ESRD on HD TTS: Patient is on HD TTS and completed her session yesterday but had an episode of hypotension during HD and felt unwell the rest of the day.  Patient has access in left anterior superior thigh with good thrill and bruit. She is on Phoslo TiD, Sensipar, Velphoro and Renvela at home. -Phoslo TID -Calcium Carbonate 200-400 mg 1-2 tabs daily -Sensipar 30 mg daily -Continue HD TTS -Repeat CMET tomorrow am  Hepatitis C s/p Liver Transplant: Patient had a liver transplant in 2010. AST 52 (baseline 25), ALT 33 (baseline 20). Patient is on Prograf 3 mg BID with which she has been compliant. -Tacrolimus 3 mg BID -Repeat CMET tomorrow am  Hypothyroidism: Last TSH normal at 1.096 on 12/28/13. Patient is on levothyroxine 150 mcg at home. -Levothyroxine 150 mcg daily -TSH  T2DM: HgbA1c 6.3 on 12/15/13. Patient is on levemir 2-5 units at home.  -Gabapentin 300 mg QHS -  CBGs AC/HS -SSI-S  HTN: BP elevated 160/80-190s/40s. Patient is on metoprolol at home.  -Metoprolol 50 mg BID  COPD: Stable. Patient is on albuterol inhaler at home. CXR showed no active cardiopulmonary disease.  -Albuterol 1-2 puffs Q6H prn  Constipation: Patient complains of chronic constipation. -Miralax 17 grams daily prn  DVT/PE ppx:  Dispo: Disposition is deferred at this time, awaiting improvement of current medical problems. Anticipated discharge in approximately 1-2 day(s).   The patient does have a current PCP Charlynn Court, NP) and does not need an [facility] hospital follow-up appointment after discharge.  The patient does not have transportation limitations that hinder transportation to clinic appointments.  Signed: Osa Craver, DO PGY-1 Internal Medicine Resident Pager # [phone] 02/19/2014 9:18 AM

## 2014-02-19 NOTE — ED Notes (Signed)
Report given to Ed, RN Pt. To be transferred to Pod C,. Room 28

## 2014-02-19 NOTE — ED Notes (Signed)
The report given to ertha on 3e.  No bed in the room.  They will call us  When the bed is produced

## 2014-02-19 NOTE — ED Notes (Signed)
Dr. Venora Maples requested another EKG. Another EKG was taken at 05:12 and given to Dr. Venora Maples.

## 2014-02-19 NOTE — ED Notes (Signed)
[name] heart monitor alarming stating that the pt had a heart rate of 155. This RN went into the pt's room to verify to find the pt diaphoretic with increased chest pain to 10/10. An EKG was shot and handed to [place], MD.

## 2014-02-19 NOTE — ED Notes (Signed)
[place], MD at bedside.  

## 2014-02-19 NOTE — ED Notes (Addendum)
Pt converted to NSR HR 81 by herself, no intervention needed. PIV established and pt placed on pads, Dr. Venora Maples at bedside throughout entire event

## 2014-02-19 NOTE — ED Notes (Signed)
Insulin not given for lunch meal. Patient refused due to CBG 134. States she does not take insulin unless her sugar is above 150.

## 2014-02-19 NOTE — ED Notes (Addendum)
Pt.'s family is at the bedside.  Updated them on plan of care.

## 2014-02-19 NOTE — ED Notes (Signed)
cbg- [id number] 

## 2014-02-19 NOTE — ED Notes (Signed)
Pt.  Is nauseated and is unable to take her Aspirin.

## 2014-02-19 NOTE — ED Notes (Signed)
Pt very drowsy,  She is c/o some lt lower rib pain when she coughs.  She has been coughing  intermiuttently according to the pt.  Not coughing now

## 2014-02-19 NOTE — ED Provider Notes (Addendum)
CSN: [id number]     Arrival date & time 02/19/14  0131 History   This chart was scribed for Hoy Morn, MD by Chester Holstein, ED Scribe. This patient was seen in room A03C/A03C and the patient's care was started at 2:03 AM.      Chief Complaint  Patient presents with  . Chest Pain  . Palpitations    The history is provided by the patient. No language interpreter was used.   HPI Comments: [name] is a 58 y.o. female with PMHx of CAD and PSH of single vessel CABG and mechanical valve placement who presents to the Emergency Department complaining of sharp chest pain with onset 2 hours PTA.  Pt notes associated palpitations, headache, and SOB.  Pt is currently on warfarin.  Pt undergoes dialysis on Tuesdays, Thursdays, and Saturdays. Pt is a smoker. She drinks 1 cup of coffee a day but denies frequent soda drinking.  She denies recent illness or fevers or chills.  She denies upper abdominal pain.  EMS EKG demonstrated a narrow complex tachycardia that resolved on arrival to emergency department.  Currently she is in normal sinus rhythm.  No history of SVT or atrial flutter.   Past Medical History  Diagnosis Date  . 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 Chronic back pain   . CAD (coronary artery disease)   . Obesity   . Peripheral vascular disease hands and legs  . Anxiety   . Asthma   . GERD (gastroesophageal reflux disease)     takes Omeprazole daily  . Chronic constipation     takes MIralax and Colace daily  . Anemia     takes Folic Acid daily  . Hypothyroidism     takes Synthroid daily  . Depression     takes Cymbalta for "severe" depression  . Neuromuscular disorder     carpal tunnel in right hand  . Hypertension     takes Metoprolol and Lisinopril daily, sees Dr Bea Graff  . COPD (chronic obstructive pulmonary disease)   . Pneumonia     "today and several times before" (08/30/2012)  .  Chronic bronchitis     "q yr w/season changes" (08/30/2012)  . Type II diabetes mellitus     Levemir 2units daily if > 150  . History of blood transfusion     "several" (08/30/2012)  . Hepatitis C   . Migraine     "last migraine was in 2013" (08/30/2012)  . Headache     "at least monthly" (08/30/2012)  . Arthritis     "left hand, back" (08/30/2012)  . End stage renal disease on dialysis 02/27/2011    Kidneys shut down at time of liver transplant in Sept 2011 at [facility] in [place], she has been on HD ever since.  Dialyzes at [facility] HD on TTS schedule.  Had L forearm graft used 10 months then removed Dec 2012 due to suspected infection.  A right upper arm AVG was placed Dec 2012 but she developed steal symptoms acutely and it was ligated the same day.  Never had an AV fistula due to small veins.  Now has L thigh AVG put in Jan 2013, has not clotted to date.    [name] CAD (coronary artery disease) Jan. 2015    Cath: 20% LAD, 50% D1; s/p LIMA-LAD  . Aortic stenosis   . Tobacco  abuse   . S/P aortic valve replacement 03/15/13    Mechanical    Past Surgical History  Procedure Laterality Date  . Liver transplant  10/25/2009    sees Dr Ferol Luz 1 every 6 months, saw last in Dec 2013. [name] Coord [phone]  . Small intestine surgery  90's  . Thrombectomy    . Arteriovenous graft placement Left 10/03/10     forearm  . Avgg removal  12/23/2010    Procedure: REMOVAL OF ARTERIOVENOUS GORETEX GRAFT ([place]);  Surgeon: Elam Dutch, MD;  Location: [place];  Service: Vascular;  Laterality: Left;  procedure started @1736 -1852  . Insertion of dialysis catheter  12/23/2010    Procedure: INSERTION OF DIALYSIS CATHETER;  Surgeon: Elam Dutch, MD;  Location: [place];  Service: Vascular;  Laterality: Right;  Right Internal Jugular 28cm dialysis catheter insertion procedure time 1701-1720   . Cholecystectomy  1993  . Cystoscopy  1990's  . Spinal growth rods  2010    "put 2 metal rods in my back; they  had detetriorated" (08/30/2012)  . Av fistula placement  01/29/2011    Procedure: INSERTION OF ARTERIOVENOUS (AV) GORE-TEX GRAFT ARM;  Surgeon: Elam Dutch, MD;  Location: [place];  Service: Vascular;  Laterality: Right;  . Av fistula placement  03/10/2011    Procedure: INSERTION OF ARTERIOVENOUS (AV) GORE-TEX GRAFT THIGH;  Surgeon: Elam Dutch, MD;  Location: [place];  Service: Vascular;  Laterality: Left;  . Tubal ligation  1990's  . Cardiac catheterization  2014  . Aortic valve replacement N/A 03/15/2013    AVR; Surgeon: Ivin Poot, MD;  Location: [facility] OR; Open Heart Surgery;  90mm[employer] mechanical prosthesis, top hat valve  . Coronary artery bypass graft N/A 03/15/2013    Procedure: CORONARY ARTERY BYPASS GRAFTING (CABG) times one using left internal mammary artery.;  Surgeon: Ivin Poot, MD;  Location: [place];  Service: Open Heart Surgery;  Laterality: N/A;  POSS CABG X 1  . Intraoperative transesophageal echocardiogram N/A 03/15/2013    Procedure: INTRAOPERATIVE TRANSESOPHAGEAL ECHOCARDIOGRAM;  Surgeon: Ivin Poot, MD;  Location: [place];  Service: Open Heart Surgery;  Laterality: N/A;  . Left heart catheterization with coronary/graft angiogram N/A 12/24/2011    Procedure: LEFT HEART CATHETERIZATION WITH Beatrix Fetters;  Surgeon: [name], MD;  Location: [facility] CATH LAB;  Service: Cardiovascular;  Laterality: N/A;  . Left heart catheterization with coronary angiogram N/A 07/29/2012    Procedure: LEFT HEART CATHETERIZATION WITH CORONARY ANGIOGRAM;  Surgeon: Troy Sine, MD;  Location: [facility] CATH LAB;  Service: Cardiovascular;  Laterality: N/A;  . Left heart catheterization with coronary angiogram N/A 03/10/2013    Procedure: LEFT HEART CATHETERIZATION WITH CORONARY ANGIOGRAM;  Surgeon: Troy Sine, MD;  Location: [facility] CATH LAB;  Service: Cardiovascular;  Laterality: N/A;  . Left heart catheterization with coronary/graft angiogram N/A 12/16/2013    Procedure: LEFT  HEART CATHETERIZATION WITH Beatrix Fetters;  Surgeon: Troy Sine, MD;  Location: [facility] CATH LAB;  Service: Cardiovascular;  Laterality: N/A;   Family History  Problem Relation Age of Onset  . Cancer Mother   . Diabetes Mother   . Hypertension Mother   . Stroke Mother   . Cancer Father   . Anesthesia problems Neg Hx   . Hypotension Neg Hx   . Malignant hyperthermia Neg Hx   . Pseudochol deficiency Neg Hx    History  Substance Use Topics  . Smoking status: Current Some Day Smoker --  0.75 packs/day for 40 years    Types: Cigarettes    Last Attempt to Quit: 03/13/2013  . Smokeless tobacco: Never Used     Comment: still smokng 1 or 2 a day  . Alcohol Use: Yes     Comment: 08/30/2012 "last drink was at a wedding July, 2014; had a small glass of wine; never had problems w/alcohol"   OB History    No data available     Review of Systems A complete 10 system review of systems was obtained and all systems are negative except as noted in the HPI and PMH.     Allergies  Acetaminophen; Codeine; Mirtazapine; and Morphine  Home Medications   Prior to Admission medications   Medication Sig Start Date End Date Taking? Authorizing Provider  albuterol (PROVENTIL,VENTOLIN) 90 MCG/ACT inhaler Inhale 1-2 puffs into the lungs every 6 (six) hours as needed for wheezing or shortness of breath.    Yes Historical Provider, MD  aspirin EC 81 MG tablet Take 81 mg by mouth daily.     Yes Historical Provider, MD  calcium acetate (PHOSLO) 667 MG capsule Take 2,001 mg by mouth 3 (three) times daily with meals.   Yes Historical Provider, MD  calcium carbonate (TUMS - DOSED IN MG ELEMENTAL CALCIUM) 500 MG chewable tablet Chew 1-2 tablets by mouth daily.    Yes Historical Provider, MD  cinacalcet (SENSIPAR) 30 MG tablet Take 30 mg by mouth daily.   Yes Historical Provider, MD  gabapentin (NEURONTIN) 300 MG capsule Take 300 mg by mouth at bedtime.    Yes Historical Provider, MD  insulin detemir  (LEVEMIR) 100 UNIT/ML injection Inject 2-5 Units into the skin daily as needed (blood). 150-200 do 2 units >200 do 5 units   Yes Historical Provider, MD  levothyroxine (SYNTHROID, LEVOTHROID) 150 MCG tablet Take 150 mcg by mouth daily before breakfast.   Yes Historical Provider, MD  LORazepam (ATIVAN) 0.5 MG tablet Take 0.5 mg by mouth at bedtime as needed for sleep.  02/08/13  Yes Historical Provider, MD  metoprolol (LOPRESSOR) 50 MG tablet On non dialysis days take 1 & 1/2 tablet in the morning and 1 tablet in the evening. On dialysis days take 1 tablet twice daily 12/28/13  Yes Troy Sine, MD  ondansetron (ZOFRAN-ODT) 4 MG disintegrating tablet Take 4 mg by mouth every 8 (eight) hours as needed for nausea or vomiting.   Yes Historical Provider, MD  polyethylene glycol (MIRALAX / GLYCOLAX) packet Take 17 g by mouth daily as needed for mild constipation.    Yes Historical Provider, MD  promethazine (PHENERGAN) 12.5 MG tablet Take 12.5 mg by mouth every 4 (four) hours as needed for nausea.  02/09/13  Yes Historical Provider, MD  tacrolimus (PROGRAF) 1 MG capsule Take 3 mg by mouth 2 (two) times daily.    Yes Historical Provider, MD  traMADol (ULTRAM) 50 MG tablet Take 50 mg by mouth every 12 (twelve) hours as needed for moderate pain.  02/09/13  Yes Historical Provider, MD  warfarin (COUMADIN) 3 MG tablet Take 3 mg by mouth daily.   Yes Historical Provider, MD  Multiple Vitamin (MULTIVITAMIN WITH MINERALS) TABS tablet Take 1 tablet by mouth at bedtime.     Historical Provider, MD  RENVELA 2.4 G PACK Take 2.4 g by mouth 3 (three) times daily with meals.  11/16/13   Historical Provider, MD  VELPHORO 500 MG chewable tablet Chew 1 tablet by mouth 3 (three) times daily with meals.  06/10/13   Historical Provider, MD   BP 164/86 mmHg  Temp(Src) 98.3 F (36.8 C) (Oral)  Resp 18  Ht 5\' 4"  (1.626 m)  Wt 165 lb (74.844 kg)  BMI 28.31 kg/m2  SpO2 100% Physical Exam  Constitutional: She is oriented to  person, place, and time. She appears well-developed and well-nourished. No distress.  HENT:  Head: Normocephalic and atraumatic.  Eyes: EOM are normal.  Neck: Normal range of motion.  Cardiovascular: Normal rate, regular rhythm and normal heart sounds.   Pulmonary/Chest: Effort normal and breath sounds normal.  Abdominal: Soft. She exhibits no distension. There is no tenderness.  Musculoskeletal: Normal range of motion.  Neurological: She is alert and oriented to person, place, and time.  Skin: Skin is warm and dry.  Psychiatric: She has a normal mood and affect. Judgment normal.  Nursing note and vitals reviewed.   ED Course  Procedures (including critical care time) DIAGNOSTIC STUDIES: Oxygen Saturation is 98% on room air, normal by my interpretation.    COORDINATION OF CARE: 2:11 AM Discussed treatment plan with patient at beside, the patient agrees with the plan and has no further questions at this time.   Labs Review Labs Reviewed  CBC WITH DIFFERENTIAL - Abnormal; Notable for the following:    Platelets 103 (*)    All other components within normal limits  COMPREHENSIVE METABOLIC PANEL - Abnormal; Notable for the following:    Chloride 93 (*)    Glucose, Bld 160 (*)    Creatinine, Ser 6.46 (*)    Calcium 8.3 (*)    Albumin 2.8 (*)    AST 52 (*)    GFR calc non Af Amer 6 (*)    GFR calc Af Amer 7 (*)    Anion gap 16 (*)    All other components within normal limits  TROPONIN I - Abnormal; Notable for the following:    Troponin I 0.04 (*)    All other components within normal limits  TROPONIN I  PROTIME-INR    Imaging Review Dg Chest 2 View  02/19/2014   CLINICAL DATA:  Sharp chest pain with palpitations, headache, and shortness of breath. Currently on warfarin. Smoker. Dialysis patient.  EXAM: CHEST  2 VIEW  COMPARISON:  01/03/2014  FINDINGS: Postoperative changes in the mediastinum, lower lumbar spine, and upper abdomen. Cardiac valve prosthesis. The heart size  and mediastinal contours are within normal limits. Both lungs are clear. The visualized skeletal structures are unremarkable. Calcification of the aorta.  IMPRESSION: No active cardiopulmonary disease.   Electronically Signed   By: Lucienne Capers M.D.   On: 02/19/2014 02:58  I personally reviewed the imaging tests through PACS system I reviewed available ER/hospitalization records through the EMR    EKG Interpretation Date/Time:  Sunday February 19 2014 01:40:08 EST Ventricular Rate:  72 PR Interval:  164 QRS Duration: 86 QT Interval:  466 QTC Calculation: 510 R Axis:   21 Text Interpretation:  Sinus rhythm Probable LVH with secondary repol abnrm  Anterior Q waves, possibly due to LVH Baseline wander in lead(s) II aVR No  significant change was found Confirmed by Alisabeth Selkirk  MD, Radiah Lubinski ([id number]) on  02/19/2014 1:55:42 AM      EKG Interpretation  Date/Time:  Sunday February 19 2014 05:46:10 EST Ventricular Rate:  154 PR Interval:  164 QRS Duration: 92 QT Interval:  344 QTC Calculation: 551 R Axis:   -38 Text Interpretation:  narrow complex  tachycardia LVH with secondary repolarization abnormality Anterior  Q waves, possibly due to LVH Prolonged QT interval changed from earlier ecg Confirmed by Cydne Grahn  MD, Odes Lolli ([id number]) on 02/19/2014 7:09:54 AM         EKG Interpretation  Date/Time:  Sunday February 19 2014 05:53:44 EST Ventricular Rate:  75 PR Interval:  182 QRS Duration: 91 QT Interval:  442 QTC Calculation: 494 R Axis:   -7 Text Interpretation:  Sinus rhythm Consider left ventricular hypertrophy Anterior Q waves, possibly due to LVH Nonspecific T abnormalities, lateral leads Baseline wander in lead(s) V3 V4 Partial missing lead(s): V4 narrow complex tachycardia resolved as compared to most recent ecg Confirmed by Cartrell Bentsen  MD, Lennette Bihari ([id number]) on 02/19/2014 7:11:19 AM          MDM   Final diagnoses:  Chest pain    While in the emergency department the patient developed  recurrent chest pain and palpitations noted to be in a narrow complex tachycardia with rate in the 150s.  I suspect this is either SVT or atrial flutter with 2:1 block.  Given her ongoing chest discomfort and recurrent nausea and vomiting the patient be admitted to the hospital.  Cardiology consultation.  Medical admission.  No ischemic changes noted on EKG.  I went to evaluate the patient when she developed recurrent palpitations and narrow complex tachycardia.  I prepared the patient to get adenosine and just prior to giving adenosine the patient converted herself back to sinus rhythm.  There is a sinus polyps followed by return to normal sinus rhythm.  I did not see any overt flutter waves.  I personally performed the services described in this documentation, which was scribed in my presence. The recorded information has been reviewed and is accurate.      Hoy Morn, MD 02/19/14 [place], MD 02/19/14 [phone]

## 2014-02-19 NOTE — Consult Note (Signed)
CONSULTATION NOTE  Reason for Consult: SVT  Requesting Physician: Dr. Waymon Budge  Cardiologist: Dr. [name]  HPI: This is a 59 y.o. female with a past medical history significant for end-stage renal disease, who has been on chronic hemodialysis. After undergoing liver transplantation at [facility] for hepatitis C She was admitted to [facility] in January with increasing chest pain. A dobutamine echo study suggested severe AS with an ejection fraction of 45%. She ultimately underwent cardiac catheterization by me which revealed mild CAD with 50% narrowing in moderately severe aortic stenosis with aortic valve area in the range of 0.8-1.2. She ultimately underwent aortic valve replacement with mechanical aortic valve and single vessel LIMA to LAD CABG revascularization surgery on 03/15/2013 by Dr. [name]. Following discharge, she presented to the hospital for further evaluation of chest pain and palpitations. Troponins were negative. Beta blocker therapy was increased to following discharge. She undergoes dialysis on Tuesday, Thursdays, and Saturdays. She was again hospitalized from 11/5-11/12/2013, with chest pain episodes which occurred during dialysis. Her ECG showed new T-wave inversion in inferior leads. He underwent a repeat cardiac catheterization on November 6 by me which showed a focal 70% mid LAD stenosis but the LIMA graft to the mid LAD was patent. Chest normal RCA. Medical therapy was recommended.   Mrs. Fugett now presents with SSCP, dyspnea, nausea, vomiting and palpitations. On arrival she was found to be in a narrow complex SVT at 154, but she broke spontaneously.  She reports fatigue and weakness. She had dialysis yesterday but felt weak for the rest of the day. Cardiology is asked to consult regarding chest pain and SVT.  PMHx:  Past Medical History  Diagnosis Date  . 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 Chronic back pain   . CAD (coronary artery disease)   . Obesity   . Peripheral vascular disease hands and legs  . Anxiety   . Asthma   . GERD (gastroesophageal reflux disease)     takes Omeprazole daily  . Chronic constipation     takes MIralax and Colace daily  . Anemia     takes Folic Acid daily  . Hypothyroidism     takes Synthroid daily  . Depression     takes Cymbalta for "severe" depression  . Neuromuscular disorder     carpal tunnel in right hand  . Hypertension     takes Metoprolol and Lisinopril daily, sees Dr Bea Graff  . COPD (chronic obstructive pulmonary disease)   . Pneumonia     "today and several times before" (08/30/2012)  . Chronic bronchitis     "q yr w/season changes" (08/30/2012)  . Type II diabetes mellitus     Levemir 2units daily if > 150  . History of blood transfusion     "several" (08/30/2012)  . Hepatitis C   . Migraine     "last migraine was in 2013" (08/30/2012)  . Headache     "at least monthly" (08/30/2012)  . Arthritis     "left hand, back" (08/30/2012)  . End stage renal disease on dialysis 02/27/2011    Kidneys shut down at time of liver transplant in Sept 2011 at [facility] in [place], she has been on HD ever since.  Dialyzes at [facility] HD on TTS schedule.  Had L forearm graft used 10 months then removed Dec 2012 due to  suspected infection.  A right upper arm AVG was placed Dec 2012 but she developed steal symptoms acutely and it was ligated the same day.  Never had an AV fistula due to small veins.  Now has L thigh AVG put in Jan 2013, has not clotted to date.    [name] CAD (coronary artery disease) Jan. 2015    Cath: 20% LAD, 50% D1; s/p LIMA-LAD  . Aortic stenosis   . Tobacco abuse   . S/P aortic valve replacement 03/15/13    Mechanical    Past Surgical History  Procedure Laterality Date  . Liver transplant  10/25/2009    sees Dr Ferol Luz 1 every 6 months, saw last in Dec 2013. [name] Coord [phone]    . Small intestine surgery  90's  . Thrombectomy    . Arteriovenous graft placement Left 10/03/10     forearm  . Avgg removal  12/23/2010    Procedure: REMOVAL OF ARTERIOVENOUS GORETEX GRAFT (Tibbie);  Surgeon: Elam Dutch, MD;  Location: [place];  Service: Vascular;  Laterality: Left;  procedure started @1736 -1852  . Insertion of dialysis catheter  12/23/2010    Procedure: INSERTION OF DIALYSIS CATHETER;  Surgeon: Elam Dutch, MD;  Location: [place];  Service: Vascular;  Laterality: Right;  Right Internal Jugular 28cm dialysis catheter insertion procedure time 1701-1720   . Cholecystectomy  1993  . Cystoscopy  1990's  . Spinal growth rods  2010    "put 2 metal rods in my back; they had detetriorated" (08/30/2012)  . Av fistula placement  01/29/2011    Procedure: INSERTION OF ARTERIOVENOUS (AV) GORE-TEX GRAFT ARM;  Surgeon: Elam Dutch, MD;  Location: [place];  Service: Vascular;  Laterality: Right;  . Av fistula placement  03/10/2011    Procedure: INSERTION OF ARTERIOVENOUS (AV) GORE-TEX GRAFT THIGH;  Surgeon: Elam Dutch, MD;  Location: [place];  Service: Vascular;  Laterality: Left;  . Tubal ligation  1990's  . Cardiac catheterization  2014  . Aortic valve replacement N/A 03/15/2013    AVR; Surgeon: Ivin Poot, MD;  Location: [facility] OR; Open Heart Surgery;  17m[employer] mechanical prosthesis, top hat valve  . Coronary artery bypass graft N/A 03/15/2013    Procedure: CORONARY ARTERY BYPASS GRAFTING (CABG) times one using left internal mammary artery.;  Surgeon: PIvin Poot MD;  [place]  Service: Open Heart Surgery;  Laterality: N/A;  POSS CABG X 1  . Intraoperative transesophageal echocardiogram N/A 03/15/2013    Procedure: INTRAOPERATIVE TRANSESOPHAGEAL ECHOCARDIOGRAM;  Surgeon: PIvin Poot MD;  [place]  Service: Open Heart Surgery;  Laterality: N/A;  . Left heart catheterization with coronary/graft angiogram N/A 12/24/2011    Procedure: LEFT HEART  CATHETERIZATION WITH CBeatrix Fetters  Surgeon: JLorretta Harp MD;  [facility];  Service: Cardiovascular;  Laterality: N/A;  . Left heart catheterization with coronary angiogram N/A 07/29/2012    Procedure: LEFT HEART CATHETERIZATION WITH CORONARY ANGIOGRAM;  Surgeon: TTroy Sine MD;  [facility];  Service: Cardiovascular;  Laterality: N/A;  . Left heart catheterization with coronary angiogram N/A 03/10/2013    Procedure: LEFT HEART CATHETERIZATION WITH CORONARY ANGIOGRAM;  Surgeon: TTroy Sine MD;  [facility];  Service: Cardiovascular;  Laterality: N/A;  . Left heart catheterization with coronary/graft angiogram N/A 12/16/2013    Procedure: LEFT HEART CATHETERIZATION WITH CBeatrix Fetters  Surgeon: TTroy Sine MD;  [facility];  Service: Cardiovascular;  Laterality:  N/A;    FAMHx: Family History  Problem Relation Age of Onset  . Cancer Mother   . Diabetes Mother   . Hypertension Mother   . Stroke Mother   . Cancer Father   . Anesthesia problems Neg Hx   . Hypotension Neg Hx   . Malignant hyperthermia Neg Hx   . Pseudochol deficiency Neg Hx     SOCHx:  reports that she has been smoking Cigarettes.  She has a 30 pack-year smoking history. She has never used smokeless tobacco. She reports that she drinks alcohol. She reports that she does not use illicit drugs.  ALLERGIES: Allergies  Allergen Reactions  . Acetaminophen Other (See Comments)    Liver transplant recipient   . Codeine Itching  . Mirtazapine Other (See Comments)    hallucination  . Morphine Itching    ROS: A comprehensive review of systems was negative except for: Constitutional: positive for fatigue and malaise Cardiovascular: positive for chest pain, irregular heart beat and palpitations Gastrointestinal: positive for nausea and vomiting  HOME MEDICATIONS:   Medication List    ASK your doctor about these medications        albuterol 90  MCG/ACT inhaler  Commonly known as:  PROVENTIL,VENTOLIN  Inhale 1-2 puffs into the lungs every 6 (six) hours as needed for wheezing or shortness of breath.     aspirin EC 81 MG tablet  Take 81 mg by mouth daily.     calcium acetate 667 MG capsule  Commonly known as:  PHOSLO  Take 2,001 mg by mouth 3 (three) times daily with meals.     calcium carbonate 500 MG chewable tablet  Commonly known as:  TUMS - dosed in mg elemental calcium  Chew 1-2 tablets by mouth daily.     cinacalcet 30 MG tablet  Commonly known as:  SENSIPAR  Take 30 mg by mouth daily.     gabapentin 300 MG capsule  Commonly known as:  NEURONTIN  Take 300 mg by mouth at bedtime.     insulin detemir 100 UNIT/ML injection  Commonly known as:  LEVEMIR  - Inject 2-5 Units into the skin daily as needed (blood). 150-200 do 2 units  - >200 do 5 units     levothyroxine 150 MCG tablet  Commonly known as:  SYNTHROID, LEVOTHROID  Take 150 mcg by mouth daily before breakfast.     LORazepam 0.5 MG tablet  Commonly known as:  ATIVAN  Take 0.5 mg by mouth at bedtime as needed for sleep.     metoprolol 50 MG tablet  Commonly known as:  LOPRESSOR  On non dialysis days take 1 & 1/2 tablet in the morning and 1 tablet in the evening. On dialysis days take 1 tablet twice daily     multivitamin with minerals Tabs tablet  Take 1 tablet by mouth at bedtime.     ondansetron 4 MG disintegrating tablet  Commonly known as:  ZOFRAN-ODT  Take 4 mg by mouth every 8 (eight) hours as needed for nausea or vomiting.     polyethylene glycol packet  Commonly known as:  MIRALAX / GLYCOLAX  Take 17 g by mouth daily as needed for mild constipation.     promethazine 12.5 MG tablet  Commonly known as:  PHENERGAN  Take 12.5 mg by mouth every 4 (four) hours as needed for nausea.     RENVELA 2.4 G Pack  Generic drug:  sevelamer carbonate  Take 2.4 g by mouth 3 (three) times  daily with meals.     tacrolimus 1 MG capsule  Commonly known  as:  PROGRAF  Take 3 mg by mouth 2 (two) times daily.     traMADol 50 MG tablet  Commonly known as:  ULTRAM  Take 50 mg by mouth every 12 (twelve) hours as needed for moderate pain.     VELPHORO 500 MG chewable tablet  Generic drug:  sucroferric oxyhydroxide  Chew 1 tablet by mouth 3 (three) times daily with meals.     warfarin 3 MG tablet  Commonly known as:  COUMADIN  Take 3 mg by mouth daily.       HOSPITAL MEDICATIONS: Scheduled: . adenosine (ADENOCARD) IV  6 mg Intravenous Once  . aspirin  325 mg Oral Once  . gi cocktail  30 mL Oral Once  . promethazine  12.5 mg Intravenous Once  . warfarin  5 mg Oral ONCE-1800  . Warfarin - Pharmacist Dosing Inpatient   Does not apply q1800    VITALS: Blood pressure 195/41, pulse 57, temperature 97.6 F (36.4 C), temperature source Oral, resp. rate 18, height 5' 4"  (1.626 m), weight 165 lb (74.844 kg), SpO2 96 %.  PHYSICAL EXAM: General appearance: alert, mild distress and somewhat somnolent, but started vomiting after I examined her Neck: no carotid bruit and no JVD Lungs: diminished breath sounds bilaterally Heart: regular rate and rhythm, S1, S2 normal, no murmur, click, rub or gallop Abdomen: soft, mild TTP in the mid-epigastric area Extremities: extremities normal, atraumatic, no cyanosis or edema Pulses: 2+ and symmetric Skin: Skin color, texture, turgor normal. No rashes or lesions Neurologic: Mental status: Somewhat somnolent, but oriented, follows commands Psych: Flat affect  LABS: Results for orders placed or performed during the hospital encounter of 02/19/14 (from the past 48 hour(s))  CBC with Differential     Status: Abnormal   Collection Time: 02/19/14  2:43 AM  Result Value Ref Range   WBC 4.1 4.0 - 10.5 K/uL   RBC 4.03 3.87 - 5.11 MIL/uL   Hemoglobin 12.8 12.0 - 15.0 g/dL   HCT 39.6 36.0 - 46.0 %   MCV 98.3 78.0 - 100.0 fL   MCH 31.8 26.0 - 34.0 pg   MCHC 32.3 30.0 - 36.0 g/dL   RDW 13.8 11.5 - 15.5 %    Platelets 103 (L) 150 - 400 K/uL    Comment: REPEATED TO VERIFY PLATELET COUNT CONFIRMED BY SMEAR    Neutrophils Relative % 53 43 - 77 %   Neutro Abs 2.2 1.7 - 7.7 K/uL   Lymphocytes Relative 32 12 - 46 %   Lymphs Abs 1.3 0.7 - 4.0 K/uL   Monocytes Relative 12 3 - 12 %   Monocytes Absolute 0.5 0.1 - 1.0 K/uL   Eosinophils Relative 2 0 - 5 %   Eosinophils Absolute 0.1 0.0 - 0.7 K/uL   Basophils Relative 1 0 - 1 %   Basophils Absolute 0.0 0.0 - 0.1 K/uL  Comprehensive metabolic panel     Status: Abnormal   Collection Time: 02/19/14  2:43 AM  Result Value Ref Range   Sodium 137 135 - 145 mmol/L    Comment: Please note change in reference range.   Potassium 3.8 3.5 - 5.1 mmol/L    Comment: Please note change in reference range.   Chloride 93 (L) 96 - 112 mEq/L   CO2 28 19 - 32 mmol/L   Glucose, Bld 160 (H) 70 - 99 mg/dL   BUN 23 6 -  23 mg/dL   Creatinine, Ser 6.46 (H) 0.50 - 1.10 mg/dL   Calcium 8.3 (L) 8.4 - 10.5 mg/dL   Total Protein 6.9 6.0 - 8.3 g/dL   Albumin 2.8 (L) 3.5 - 5.2 g/dL   AST 52 (H) 0 - 37 U/L   ALT 33 0 - 35 U/L   Alkaline Phosphatase 82 39 - 117 U/L   Total Bilirubin 0.6 0.3 - 1.2 mg/dL   GFR calc non Af Amer 6 (L) >90 mL/min   GFR calc Af Amer 7 (L) >90 mL/min    Comment: (NOTE) The eGFR has been calculated using the CKD EPI equation. This calculation has not been validated in all clinical situations. eGFR's persistently <90 mL/min signify possible Chronic Kidney Disease.    Anion gap 16 (H) 5 - 15  Troponin I     Status: Abnormal   Collection Time: 02/19/14  2:43 AM  Result Value Ref Range   Troponin I 0.04 (H) <0.031 ng/mL    Comment:        PERSISTENTLY INCREASED TROPONIN VALUES IN THE RANGE OF 0.04-0.49 ng/mL CAN BE SEEN IN:       -UNSTABLE ANGINA       -CONGESTIVE HEART FAILURE       -MYOCARDITIS       -CHEST TRAUMA       -ARRYHTHMIAS       -LATE PRESENTING MYOCARDIAL INFARCTION       -COPD   CLINICAL FOLLOW-UP RECOMMENDED. Please note  change in reference range.   Troponin I     Status: Abnormal   Collection Time: 02/19/14  5:37 AM  Result Value Ref Range   Troponin I 0.04 (H) <0.031 ng/mL    Comment:        PERSISTENTLY INCREASED TROPONIN VALUES IN THE RANGE OF 0.04-0.49 ng/mL CAN BE SEEN IN:       -UNSTABLE ANGINA       -CONGESTIVE HEART FAILURE       -MYOCARDITIS       -CHEST TRAUMA       -ARRYHTHMIAS       -LATE PRESENTING MYOCARDIAL INFARCTION       -COPD   CLINICAL FOLLOW-UP RECOMMENDED. Please note change in reference range.   Protime-INR     Status: Abnormal   Collection Time: 02/19/14  7:00 AM  Result Value Ref Range   Prothrombin Time 15.6 (H) 11.6 - 15.2 seconds   INR 1.23 0.00 - 1.49    IMAGING: Dg Chest 2 View  02/19/2014   CLINICAL DATA:  Sharp chest pain with palpitations, headache, and shortness of breath. Currently on warfarin. Smoker. Dialysis patient.  EXAM: CHEST  2 VIEW  COMPARISON:  01/03/2014  FINDINGS: Postoperative changes in the mediastinum, lower lumbar spine, and upper abdomen. Cardiac valve prosthesis. The heart size and mediastinal contours are within normal limits. Both lungs are clear. The visualized skeletal structures are unremarkable. Calcification of the aorta.  IMPRESSION: No active cardiopulmonary disease.   Electronically Signed   By: Lucienne Capers M.D.   On: 02/19/2014 02:58    HOSPITAL DIAGNOSES: Principal Problem:   Paroxysmal supraventricular tachycardia, s/p valve replacement, with chest pain. resolved as outpatient Active Problems:   DM2 (diabetes mellitus, type 2)   End stage renal disease on dialysis-TTS   Dyslipidemia-LDL 104, not on statin with Hx of liver transplant   Chest pain   S/P mechanical AVR Feb 2015   S/P CABG x 1, LIMA-LAD 03/2013   Nausea and  vomiting   RECOMMENDATION(S): 1. Chest pain-this is been an ongoing problem and she recently had cardiac catheterization in November 2015. This showed patent graft with 70% mid LAD stenosis that was  bypassed. Troponin is negative 2. Echo in November showed EF 55-60% with normal mechanical valve function, however her INR is only 1.23. Would recommend a repeat echo to evaluate for any mechanical valve obstruction. 2. PSVT-she has a history of this after valve replacement surgery. As blood pressure tolerates would uptitrate her beta blocker. She is currently converted back to sinus rhythm. 3. Mechanical aVR- she has a [employer] mechanical aortic valve replacement. She was supposed to be on warfarin at home and reports that she has been taking it although her INR today is only 1.23. Would recommend starting heparin and consult pharmacy for adjustments in her warfarin levels. Will recheck echocardiogram to evaluate for mechanical valve obstruction. 4. Nausea/vomiting- we will defer to medicine service, could be related to gastroparesis, dialysis, gastroenteritis and/or number of other medical issues. Her white blood cell count is normal. AST is mildly elevated. BUN is only 23, but creatinine is 6.46.  Thanks for consulting cardiology. We will follow closely with you.  Time Spent Directly with Patient: 30 minutes  Pixie Casino, MD, [facility] Attending Cardiologist [facility]  Wilmarie Sparlin C 02/19/2014, 9:48 AM

## 2014-02-19 NOTE — ED Notes (Signed)
Per EMS, pt started having chest pain x 2 hours ago with heart palpitations and diaphoresis. EMS reports that the pt was tachy in the 150s with chest pain 10/10. Pt converted on her own to a rate of 81, with chest pain decrease to 6/10. Pt given 324 ASA.

## 2014-02-19 NOTE — ED Notes (Signed)
Pt. Slightly lethargic, easily arousable with voice. States "I did not sleep at all last night so I'm really tired".

## 2014-02-19 NOTE — Progress Notes (Signed)
[place] for warfarin and heparin Indication: Mechanical heart valve - AVR  Allergies  Allergen Reactions  . Acetaminophen Other (See Comments)    Liver transplant recipient   . Codeine Itching  . Mirtazapine Other (See Comments)    hallucination  . Morphine Itching    Patient Measurements: Height: 5\' 4"  (162.6 cm) Weight: 165 lb (74.844 kg) IBW/kg (Calculated) : 54.7 Heparin Dosing Weight: 70 kg  Vital Signs: Temp: 98.1 F (36.7 C) (01/10 1732) Temp Source: Oral (01/10 1732) BP: 175/58 mmHg (01/10 1732) Pulse Rate: 63 (01/10 1732)  Labs:  Recent Labs  02/19/14 0243 02/19/14 0537 02/19/14 0700 02/19/14 1200 02/19/14 1836  HGB 12.8  --   --   --   --   HCT 39.6  --   --   --   --   PLT 103*  --   --   --   --   LABPROT  --   --  15.6*  --   --   INR  --   --  1.23  --   --   HEPARINUNFRC  --   --   --   --  0.43  CREATININE 6.46*  --   --   --   --   TROPONINI 0.04* 0.04*  --  0.04*  --     Estimated Creatinine Clearance: 9.3 mL/min (by C-G formula based on Cr of 6.46).   Medications:  See home med list.   Home warfarin dose is 3mg  daily  Assessment: 59 year old woman in warfarin prior to admission for a mechanical AVR. INR on admission is 1.23 which is below her goal of 2.5-3 (per anticoagulation clinic note).  Heparin IV to start as a bridge to her warfarin being therapeutic.  Warfarin also to be continued.  PM Heparin level therapeutic  Goal of Therapy:  INR 2-3 Heparin level 0.3-0.7 units/ml Monitor platelets by anticoagulation protocol: Yes   Plan:  Continue heparin at 1000 units / hr Follow up AM labs  Thank you. Anette Guarneri, PharmD [phone] 02/19/2014,7:21 PM

## 2014-02-19 NOTE — ED Notes (Signed)
Spoke with daughter about delay. Verbalized understanding.

## 2014-02-19 NOTE — ED Notes (Signed)
Pt continues to c/o of pain, Dr. Venora Maples notified and is at bedside

## 2014-02-20 DIAGNOSIS — K5900 Constipation, unspecified: Secondary | ICD-10-CM

## 2014-02-20 DIAGNOSIS — B182 Chronic viral hepatitis C: Secondary | ICD-10-CM

## 2014-02-20 DIAGNOSIS — Z794 Long term (current) use of insulin: Secondary | ICD-10-CM

## 2014-02-20 DIAGNOSIS — Z944 Liver transplant status: Secondary | ICD-10-CM | POA: Insufficient documentation

## 2014-02-20 DIAGNOSIS — Z992 Dependence on renal dialysis: Secondary | ICD-10-CM

## 2014-02-20 DIAGNOSIS — Z7901 Long term (current) use of anticoagulants: Secondary | ICD-10-CM

## 2014-02-20 DIAGNOSIS — Z7982 Long term (current) use of aspirin: Secondary | ICD-10-CM

## 2014-02-20 DIAGNOSIS — I5032 Chronic diastolic (congestive) heart failure: Secondary | ICD-10-CM

## 2014-02-20 DIAGNOSIS — Z951 Presence of aortocoronary bypass graft: Secondary | ICD-10-CM

## 2014-02-20 DIAGNOSIS — Z7951 Long term (current) use of inhaled steroids: Secondary | ICD-10-CM

## 2014-02-20 DIAGNOSIS — I132 Hypertensive heart and chronic kidney disease with heart failure and with stage 5 chronic kidney disease, or end stage renal disease: Secondary | ICD-10-CM

## 2014-02-20 DIAGNOSIS — F17210 Nicotine dependence, cigarettes, uncomplicated: Secondary | ICD-10-CM

## 2014-02-20 DIAGNOSIS — I2510 Atherosclerotic heart disease of native coronary artery without angina pectoris: Secondary | ICD-10-CM

## 2014-02-20 DIAGNOSIS — I471 Supraventricular tachycardia: Principal | ICD-10-CM

## 2014-02-20 DIAGNOSIS — E039 Hypothyroidism, unspecified: Secondary | ICD-10-CM | POA: Insufficient documentation

## 2014-02-20 DIAGNOSIS — E1122 Type 2 diabetes mellitus with diabetic chronic kidney disease: Secondary | ICD-10-CM

## 2014-02-20 DIAGNOSIS — I059 Rheumatic mitral valve disease, unspecified: Secondary | ICD-10-CM

## 2014-02-20 DIAGNOSIS — Z954 Presence of other heart-valve replacement: Secondary | ICD-10-CM

## 2014-02-20 DIAGNOSIS — J449 Chronic obstructive pulmonary disease, unspecified: Secondary | ICD-10-CM

## 2014-02-20 DIAGNOSIS — E785 Hyperlipidemia, unspecified: Secondary | ICD-10-CM

## 2014-02-20 DIAGNOSIS — N186 End stage renal disease: Secondary | ICD-10-CM

## 2014-02-20 LAB — COMPREHENSIVE METABOLIC PANEL
ALBUMIN: 2.6 g/dL — AB (ref 3.5–5.2)
ALT: 29 U/L (ref 0–35)
ANION GAP: 10 (ref 5–15)
AST: 36 U/L (ref 0–37)
Alkaline Phosphatase: 76 U/L (ref 39–117)
BILIRUBIN TOTAL: 0.6 mg/dL (ref 0.3–1.2)
BUN: 43 mg/dL — ABNORMAL HIGH (ref 6–23)
CALCIUM: 7.9 mg/dL — AB (ref 8.4–10.5)
CO2: 30 mmol/L (ref 19–32)
Chloride: 95 mEq/L — ABNORMAL LOW (ref 96–112)
Creatinine, Ser: 8.86 mg/dL — ABNORMAL HIGH (ref 0.50–1.10)
GFR calc non Af Amer: 4 mL/min — ABNORMAL LOW (ref >90)
GFR, EST AFRICAN AMERICAN: 5 mL/min — AB (ref >90)
Glucose, Bld: 145 mg/dL — ABNORMAL HIGH (ref 70–99)
POTASSIUM: 4.8 mmol/L (ref 3.5–5.1)
SODIUM: 135 mmol/L (ref 135–145)
Total Protein: 6.5 g/dL (ref 6.0–8.3)

## 2014-02-20 LAB — RENAL FUNCTION PANEL
ALBUMIN: 2.7 g/dL — AB (ref 3.5–5.2)
Anion gap: 9 (ref 5–15)
BUN: 54 mg/dL — ABNORMAL HIGH (ref 6–23)
CALCIUM: 7.8 mg/dL — AB (ref 8.4–10.5)
CO2: 29 mmol/L (ref 19–32)
CREATININE: 9.95 mg/dL — AB (ref 0.50–1.10)
Chloride: 98 mEq/L (ref 96–112)
GFR calc non Af Amer: 4 mL/min — ABNORMAL LOW (ref >90)
GFR, EST AFRICAN AMERICAN: 4 mL/min — AB (ref >90)
Glucose, Bld: 105 mg/dL — ABNORMAL HIGH (ref 70–99)
Phosphorus: 7.1 mg/dL — ABNORMAL HIGH (ref 2.3–4.6)
Potassium: 5 mmol/L (ref 3.5–5.1)
SODIUM: 136 mmol/L (ref 135–145)

## 2014-02-20 LAB — PROTIME-INR
INR: 1.19 (ref 0.00–1.49)
Prothrombin Time: 15.3 seconds — ABNORMAL HIGH (ref 11.6–15.2)

## 2014-02-20 LAB — GLUCOSE, CAPILLARY
Glucose-Capillary: 121 mg/dL — ABNORMAL HIGH (ref 70–99)
Glucose-Capillary: 129 mg/dL — ABNORMAL HIGH (ref 70–99)
Glucose-Capillary: 86 mg/dL (ref 70–99)
Glucose-Capillary: 205 mg/dL — ABNORMAL HIGH (ref 70–99)

## 2014-02-20 LAB — HEPARIN LEVEL (UNFRACTIONATED): HEPARIN UNFRACTIONATED: 0.34 [IU]/mL (ref 0.30–0.70)

## 2014-02-20 LAB — CBC
HCT: 37.2 % (ref 36.0–46.0)
Hemoglobin: 12 g/dL (ref 12.0–15.0)
MCH: 31.7 pg (ref 26.0–34.0)
MCHC: 32.3 g/dL (ref 30.0–36.0)
MCV: 98.4 fL (ref 78.0–100.0)
Platelets: 107 10*3/uL — ABNORMAL LOW (ref 150–400)
RBC: 3.78 MIL/uL — AB (ref 3.87–5.11)
RDW: 13.9 % (ref 11.5–15.5)
WBC: 5.8 10*3/uL (ref 4.0–10.5)

## 2014-02-20 MED ORDER — WARFARIN SODIUM 5 MG PO TABS
5.0000 mg | ORAL_TABLET | Freq: Once | ORAL | Status: AC
  Administered 2014-02-20: 5 mg via ORAL
  Filled 2014-02-20: qty 1

## 2014-02-20 NOTE — Consult Note (Signed)
Renal Service Consult Note [place] 02/20/2014 Roney Jaffe D Requesting Physician:  Dr Beryle Beams  Reason for Consult:  ESRD pt with SVT HPI: The patient is a 59 y.o. year-old with hx of cirrhosis and liver transplant, CABG/ AVR in 03/2013, HTN presented with palpitations, SOB , N/V and dizziness. In ED had SVT at 154, then spontaneously converted to NSR.  Troponins are negative. CXR negative for edema.  INR low so is getting IV hep  Denies active CP, rash, jt pain, HA, confusion, N/V/D, or abd pain.    Past Medical History  Past Medical History  Diagnosis Date  . 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 Chronic back pain   . CAD (coronary artery disease)   . Obesity   . Peripheral vascular disease hands and legs  . Anxiety   . Asthma   . GERD (gastroesophageal reflux disease)     takes Omeprazole daily  . Chronic constipation     takes MIralax and Colace daily  . Anemia     takes Folic Acid daily  . Hypothyroidism     takes Synthroid daily  . Depression     takes Cymbalta for "severe" depression  . Neuromuscular disorder     carpal tunnel in right hand  . Hypertension     takes Metoprolol and Lisinopril daily, sees Dr Bea Graff  . COPD (chronic obstructive pulmonary disease)   . Pneumonia     "today and several times before" (08/30/2012)  . Chronic bronchitis     "q yr w/season changes" (08/30/2012)  . Type II diabetes mellitus     Levemir 2units daily if > 150  . History of blood transfusion     "several" (08/30/2012)  . Hepatitis C   . Migraine     "last migraine was in 2013" (08/30/2012)  . Headache     "at least monthly" (08/30/2012)  . Arthritis     "left hand, back" (08/30/2012)  . End stage renal disease on dialysis 02/27/2011    Kidneys shut down at time of liver transplant in Sept 2011 at [facility] in [place], she has been on HD ever since.  Dialyzes at  [facility] HD on TTS schedule.  Had L forearm graft used 10 months then removed Dec 2012 due to suspected infection.  A right upper arm AVG was placed Dec 2012 but she developed steal symptoms acutely and it was ligated the same day.  Never had an AV fistula due to small veins.  Now has L thigh AVG put in Jan 2013, has not clotted to date.    [name] CAD (coronary artery disease) Jan. 2015    Cath: 20% LAD, 50% D1; s/p LIMA-LAD  . Aortic stenosis   . Tobacco abuse   . S/P aortic valve replacement 03/15/13    Mechanical    Past Surgical History  Past Surgical History  Procedure Laterality Date  . Liver transplant  10/25/2009    sees Dr Ferol Luz 1 every 6 months, saw last in Dec 2013. [name] Coord [phone]  . Small intestine surgery  90's  . Thrombectomy    . Arteriovenous graft placement Left 10/03/10     forearm  . Avgg removal  12/23/2010    Procedure: REMOVAL OF ARTERIOVENOUS GORETEX GRAFT ([place]);  Surgeon: Elam Dutch, MD;  Location: [place];  Service: Vascular;  Laterality: Left;  procedure started @1736 -1852  . Insertion of dialysis catheter  12/23/2010    Procedure: INSERTION OF DIALYSIS CATHETER;  Surgeon: Elam Dutch, MD;  Location: [place];  Service: Vascular;  Laterality: Right;  Right Internal Jugular 28cm dialysis catheter insertion procedure time 1701-1720   . Cholecystectomy  1993  . Cystoscopy  1990's  . Spinal growth rods  2010    "put 2 metal rods in my back; they had detetriorated" (08/30/2012)  . Av fistula placement  01/29/2011    Procedure: INSERTION OF ARTERIOVENOUS (AV) GORE-TEX GRAFT ARM;  Surgeon: Elam Dutch, MD;  Location: [place];  Service: Vascular;  Laterality: Right;  . Av fistula placement  03/10/2011    Procedure: INSERTION OF ARTERIOVENOUS (AV) GORE-TEX GRAFT THIGH;  Surgeon: Elam Dutch, MD;  Location: [place];  Service: Vascular;  Laterality: Left;  . Tubal ligation  1990's  . Cardiac catheterization  2014  . Aortic valve replacement N/A  03/15/2013    AVR; Surgeon: Ivin Poot, MD;  Location: [facility] OR; Open Heart Surgery;  47mm[employer] mechanical prosthesis, top hat valve  . Coronary artery bypass graft N/A 03/15/2013    Procedure: CORONARY ARTERY BYPASS GRAFTING (CABG) times one using left internal mammary artery.;  Surgeon: Ivin Poot, MD;  Location: [place];  Service: Open Heart Surgery;  Laterality: N/A;  POSS CABG X 1  . Intraoperative transesophageal echocardiogram N/A 03/15/2013    Procedure: INTRAOPERATIVE TRANSESOPHAGEAL ECHOCARDIOGRAM;  Surgeon: Ivin Poot, MD;  Location: [place];  Service: Open Heart Surgery;  Laterality: N/A;  . Left heart catheterization with coronary/graft angiogram N/A 12/24/2011    Procedure: LEFT HEART CATHETERIZATION WITH Beatrix Fetters;  Surgeon: [name], MD;  Location: [facility] CATH LAB;  Service: Cardiovascular;  Laterality: N/A;  . Left heart catheterization with coronary angiogram N/A 07/29/2012    Procedure: LEFT HEART CATHETERIZATION WITH CORONARY ANGIOGRAM;  Surgeon: Troy Sine, MD;  Location: [facility] CATH LAB;  Service: Cardiovascular;  Laterality: N/A;  . Left heart catheterization with coronary angiogram N/A 03/10/2013    Procedure: LEFT HEART CATHETERIZATION WITH CORONARY ANGIOGRAM;  Surgeon: Troy Sine, MD;  Location: [facility] CATH LAB;  Service: Cardiovascular;  Laterality: N/A;  . Left heart catheterization with coronary/graft angiogram N/A 12/16/2013    Procedure: LEFT HEART CATHETERIZATION WITH Beatrix Fetters;  Surgeon: Troy Sine, MD;  Location: [facility] CATH LAB;  Service: Cardiovascular;  Laterality: N/A;   Family History  Family History  Problem Relation Age of Onset  . Cancer Mother   . Diabetes Mother   . Hypertension Mother   . Stroke Mother   . Cancer Father   . Anesthesia problems Neg Hx   . Hypotension Neg Hx   . Malignant hyperthermia Neg Hx   . Pseudochol deficiency Neg Hx    Social History  reports that she has been smoking Cigarettes.   She has a 30 pack-year smoking history. She has never used smokeless tobacco. She reports that she drinks alcohol. She reports that she does not use illicit drugs. Allergies  Allergies  Allergen Reactions  . Acetaminophen Other (See Comments)    Liver transplant recipient   . Codeine Itching  . Mirtazapine Other (See Comments)    hallucination  . Morphine Itching   Home medications Prior to Admission medications   Medication Sig Start Date End Date Taking? Authorizing Provider  albuterol (PROVENTIL,VENTOLIN) 90 MCG/ACT inhaler Inhale 1-2 puffs into the lungs every 6 (six) hours as needed  for wheezing or shortness of breath.    Yes Historical Provider, MD  aspirin EC 81 MG tablet Take 81 mg by mouth daily.     Yes Historical Provider, MD  calcium acetate (PHOSLO) 667 MG capsule Take 2,001 mg by mouth 3 (three) times daily with meals.   Yes Historical Provider, MD  calcium carbonate (TUMS - DOSED IN MG ELEMENTAL CALCIUM) 500 MG chewable tablet Chew 1-2 tablets by mouth daily.    Yes Historical Provider, MD  cinacalcet (SENSIPAR) 30 MG tablet Take 30 mg by mouth daily.   Yes Historical Provider, MD  gabapentin (NEURONTIN) 300 MG capsule Take 300 mg by mouth at bedtime.    Yes Historical Provider, MD  insulin detemir (LEVEMIR) 100 UNIT/ML injection Inject 2-5 Units into the skin daily as needed (blood). 150-200 do 2 units >200 do 5 units   Yes Historical Provider, MD  levothyroxine (SYNTHROID, LEVOTHROID) 150 MCG tablet Take 150 mcg by mouth daily before breakfast.   Yes Historical Provider, MD  LORazepam (ATIVAN) 0.5 MG tablet Take 0.5 mg by mouth at bedtime as needed for sleep.  02/08/13  Yes Historical Provider, MD  metoprolol (LOPRESSOR) 50 MG tablet On non dialysis days take 1 & 1/2 tablet in the morning and 1 tablet in the evening. On dialysis days take 1 tablet twice daily 12/28/13  Yes Troy Sine, MD  ondansetron (ZOFRAN-ODT) 4 MG disintegrating tablet Take 4 mg by mouth every 8  (eight) hours as needed for nausea or vomiting.   Yes Historical Provider, MD  polyethylene glycol (MIRALAX / GLYCOLAX) packet Take 17 g by mouth daily as needed for mild constipation.    Yes Historical Provider, MD  promethazine (PHENERGAN) 12.5 MG tablet Take 12.5 mg by mouth every 4 (four) hours as needed for nausea.  02/09/13  Yes Historical Provider, MD  tacrolimus (PROGRAF) 1 MG capsule Take 3 mg by mouth 2 (two) times daily.    Yes Historical Provider, MD  traMADol (ULTRAM) 50 MG tablet Take 50 mg by mouth every 12 (twelve) hours as needed for moderate pain.  02/09/13  Yes Historical Provider, MD  warfarin (COUMADIN) 3 MG tablet Take 3 mg by mouth daily.   Yes Historical Provider, MD  Multiple Vitamin (MULTIVITAMIN WITH MINERALS) TABS tablet Take 1 tablet by mouth at bedtime.     Historical Provider, MD  RENVELA 2.4 G PACK Take 2.4 g by mouth 3 (three) times daily with meals.  11/16/13   Historical Provider, MD  VELPHORO 500 MG chewable tablet Chew 1 tablet by mouth 3 (three) times daily with meals.  06/10/13   Historical Provider, MD   Liver Function Tests  Recent Labs Lab 02/19/14 0243 02/20/14 0345 02/20/14 1618  AST 52* 36  --   ALT 33 29  --   ALKPHOS 82 76  --   BILITOT 0.6 0.6  --   PROT 6.9 6.5  --   ALBUMIN 2.8* 2.6* 2.7*   No results for input(s): LIPASE, AMYLASE in the last 168 hours. CBC  Recent Labs Lab 02/19/14 0243 02/20/14 0345  WBC 4.1 5.8  NEUTROABS 2.2  --   HGB 12.8 12.0  HCT 39.6 37.2  MCV 98.3 98.4  PLT 103* 387*   Basic Metabolic Panel  Recent Labs Lab 02/19/14 0243 02/20/14 0345 02/20/14 1618  NA 137 135 136  K 3.8 4.8 5.0  CL 93* 95* 98  CO2 28 30 29   GLUCOSE 160* 145* 105*  BUN 23 43*  54*  CREATININE 6.46* 8.86* 9.95*  CALCIUM 8.3* 7.9* 7.8*  PHOS  --   --  7.1*    Filed Vitals:   02/20/14 0655 02/20/14 0947 02/20/14 1546 02/20/14 1759  BP: 131/43 139/52 130/46 157/53  Pulse: 59 66 60 62  Temp: 97.9 F (36.6 C)  97.5 F (36.4  C)   TempSrc: Oral  Oral   Resp: 16  18   Height:      Weight: 77.2 kg (170 lb 3.1 oz)     SpO2: 100%  100%    Exam Alert, no distress No rash, cyanosis or gangrene Sclera anicteric, throat clear No jvd Chest clear bilat RRR soft SEM, metallic valve click, no RG Sternotomy scar w keloid Abd soft, NTND, no mass or ascites No LE edema L thigh AVG patent Neuro is nf, Ox 3   HD: TTS [name] From Nov 2015 admit > 3'45" 180 400/A1.5 EDW 76.5 kg 2K2.5Ca Left thigh AVG No heparin  Assessment: 1. SVT - converted to NSR 2. S/P CABG/AVR in Feb 2015 - on IV hep, coumadin  3. ESRD on HD 4. HTN / vol - is stable on exam 5. Anemia Hb 12   Plan- HD tomorrow  [name] MD (pgr) [phone]    ([phone] 02/20/2014, 6:04 PM

## 2014-02-20 NOTE — Progress Notes (Signed)
Subjective:  Patient was seen and examined this morning. She continues to have substernal, non-radiating, mild chest pain 3/10. She denies any recurrent rapid heart rate or palpitations.   Objective: Vital signs in last 24 hours: Filed Vitals:   02/19/14 1732 02/19/14 2206 02/20/14 0655 02/20/14 0947  BP: 175/58 152/47 131/43 139/52  Pulse: 63 62 59 66  Temp: 98.1 F (36.7 C) 98.3 F (36.8 C) 97.9 F (36.6 C)   TempSrc: Oral Oral Oral   Resp:  15 16   Height: 5\' 4"  (1.626 m)     Weight:   77.2 kg (170 lb 3.1 oz)   SpO2: 100% 100% 100%    Weight change: 2.356 kg (5 lb 3.1 oz)  Intake/Output Summary (Last 24 hours) at 02/20/14 1452 Last data filed at 02/20/14 1348  Gross per 24 hour  Intake    810 ml  Output      1 ml  Net    809 ml   General: Vital signs reviewed. Patient is well-developed and well-nourished, in no acute distress and cooperative with exam.  HEENT: Alopecia patches on scalp. Neck is supple, no carotid bruit present.  Cardiovascular: RRR, S1 normal, S2 normal Pulmonary/Chest: Clear to auscultation bilaterally, no wheezes, rales, or rhonchi. Abdominal: Soft, non-tender, non-distended, BS +, large, well-healed surgical scar.  Extremities: AVF in place in left lower extremity on anterior superior thigh with good thrill and bruit. No lower extremity edema bilaterally Skin: Warm, dry and intact. Psychiatric: Normal mood and affect. Speech and behavior is normal. Cognition and memory are normal.   Lab Results: Basic Metabolic Panel:  Recent Labs Lab 02/19/14 0243 02/19/14 0620 02/20/14 0345  NA 137  --  135  K 3.8  --  4.8  CL 93*  --  95*  CO2 28  --  30  GLUCOSE 160*  --  145*  BUN 23  --  43*  CREATININE 6.46*  --  8.86*  CALCIUM 8.3*  --  7.9*  MG  --  2.2  --    Liver Function Tests:  Recent Labs Lab 02/19/14 0243 02/20/14 0345  AST 52* 36  ALT 33 29  ALKPHOS 82 76  BILITOT 0.6 0.6  PROT 6.9 6.5  ALBUMIN 2.8* 2.6*    CBC:  Recent Labs Lab 02/19/14 0243 02/20/14 0345  WBC 4.1 5.8  NEUTROABS 2.2  --   HGB 12.8 12.0  HCT 39.6 37.2  MCV 98.3 98.4  PLT 103* 107*   Cardiac Enzymes:  Recent Labs Lab 02/19/14 0537 02/19/14 1200 02/19/14 1836  TROPONINI 0.04* 0.04* 0.05*   CBG:  Recent Labs Lab 02/19/14 1008 02/19/14 1254 02/19/14 1752 02/19/14 2203 02/20/14 0627 02/20/14 1128  GLUCAP 269* 138* 137* 116* 121* 129*   Thyroid Function Tests:  Recent Labs Lab 02/19/14 1218  TSH 2.242   Coagulation:  Recent Labs Lab 02/19/14 0700 02/20/14 0345  LABPROT 15.6* 15.3*  INR 1.23 1.19   Studies/Results: Dg Chest 2 View  02/19/2014   CLINICAL DATA:  Sharp chest pain with palpitations, headache, and shortness of breath. Currently on warfarin. Smoker. Dialysis patient.  EXAM: CHEST  2 VIEW  COMPARISON:  01/03/2014  FINDINGS: Postoperative changes in the mediastinum, lower lumbar spine, and upper abdomen. Cardiac valve prosthesis. The heart size and mediastinal contours are within normal limits. Both lungs are clear. The visualized skeletal structures are unremarkable. Calcification of the aorta.  IMPRESSION: No active cardiopulmonary disease.   Electronically Signed   By: Gwyndolyn Saxon  Gerilyn Nestle M.D.   On: 02/19/2014 02:58   Medications:  I have reviewed the patient's current medications. Prior to Admission:  Prescriptions prior to admission  Medication Sig Dispense Refill Last Dose  . albuterol (PROVENTIL,VENTOLIN) 90 MCG/ACT inhaler Inhale 1-2 puffs into the lungs every 6 (six) hours as needed for wheezing or shortness of breath.    Past Month at Unknown time  . aspirin EC 81 MG tablet Take 81 mg by mouth daily.     02/18/2014 at Unknown time  . calcium acetate (PHOSLO) 667 MG capsule Take 2,001 mg by mouth 3 (three) times daily with meals.   02/18/2014 at Unknown time  . calcium carbonate (TUMS - DOSED IN MG ELEMENTAL CALCIUM) 500 MG chewable tablet Chew 1-2 tablets by mouth daily.    02/18/2014  at Unknown time  . cinacalcet (SENSIPAR) 30 MG tablet Take 30 mg by mouth daily.   02/18/2014 at Unknown time  . gabapentin (NEURONTIN) 300 MG capsule Take 300 mg by mouth at bedtime.    02/18/2014 at Unknown time  . insulin detemir (LEVEMIR) 100 UNIT/ML injection Inject 2-5 Units into the skin daily as needed (blood). 150-200 do 2 units >200 do 5 units   last month  . levothyroxine (SYNTHROID, LEVOTHROID) 150 MCG tablet Take 150 mcg by mouth daily before breakfast.   02/18/2014 at Unknown time  . LORazepam (ATIVAN) 0.5 MG tablet Take 0.5 mg by mouth at bedtime as needed for sleep.    Past Month at Unknown time  . metoprolol (LOPRESSOR) 50 MG tablet On non dialysis days take 1 & 1/2 tablet in the morning and 1 tablet in the evening. On dialysis days take 1 tablet twice daily 180 tablet 3 02/18/2014 at 2300  . ondansetron (ZOFRAN-ODT) 4 MG disintegrating tablet Take 4 mg by mouth every 8 (eight) hours as needed for nausea or vomiting.   unknown  . polyethylene glycol (MIRALAX / GLYCOLAX) packet Take 17 g by mouth daily as needed for mild constipation.    Past Month at Unknown time  . promethazine (PHENERGAN) 12.5 MG tablet Take 12.5 mg by mouth every 4 (four) hours as needed for nausea.    unknown  . tacrolimus (PROGRAF) 1 MG capsule Take 3 mg by mouth 2 (two) times daily.    02/18/2014 at Unknown time  . traMADol (ULTRAM) 50 MG tablet Take 50 mg by mouth every 12 (twelve) hours as needed for moderate pain.    unknown  . warfarin (COUMADIN) 3 MG tablet Take 3 mg by mouth daily.   02/18/2014 at Unknown time  . Multiple Vitamin (MULTIVITAMIN WITH MINERALS) TABS tablet Take 1 tablet by mouth at bedtime.    Past Week at Unknown time  . RENVELA 2.4 G PACK Take 2.4 g by mouth 3 (three) times daily with meals.    01/02/2014 at Unknown time  . VELPHORO 500 MG chewable tablet Chew 1 tablet by mouth 3 (three) times daily with meals.    01/02/2014 at Unknown time   Scheduled Meds: . aspirin EC  81 mg Oral Daily  .  aspirin  325 mg Oral Once  . calcium acetate  2,001 mg Oral TID WC  . calcium carbonate  1-2 tablet Oral Daily  . cinacalcet  30 mg Oral Q lunch  . gabapentin  300 mg Oral QHS  . gi cocktail  30 mL Oral Once  . insulin aspart  0-9 Units Subcutaneous TID WC  . levothyroxine  150 mcg Oral QAC breakfast  .  metoprolol  50 mg Oral BID  . sodium chloride  3 mL Intravenous Q12H  . tacrolimus  3 mg Oral BID  . warfarin  5 mg Oral ONCE-1800  . Warfarin - Pharmacist Dosing Inpatient   Does not apply q1800   Continuous Infusions: . heparin 1,000 Units/hr (02/20/14 0626)   PRN Meds:.albuterol, LORazepam, polyethylene glycol, promethazine, traMADol Assessment/Plan: Principal Problem:   Paroxysmal supraventricular tachycardia, s/p valve replacement, with chest pain. resolved as outpatient Active Problems:   DM2 (diabetes mellitus, type 2)   End stage renal disease on dialysis-TTS   Dyslipidemia-LDL 104, not on statin with Hx of liver transplant   Chest pain   S/P mechanical AVR Feb 2015   S/P CABG x 1, LIMA-LAD 03/2013   Nausea and vomiting  Paroxysmal SVT: Patient has remained sinus overnight. HR 50-60s. -Cardiology following, appreciate recommendations -ASA 81 mg dailyl -Metoprolol 50 mg BID -Phenergan 12.5 mg Q4H prn -Cardiac monitoring -Cardiac diet -Echo pending  Chest Pain in setting of CAD s/p CABG and Aortic Valve Replacement:  Patient has a history of chronic, on going chest pain. Recent cath in November 2015 that showed patent graft with 70% med LaD stenosis that was bypassed. Patient is on ASA, metoprolol, and warfarin at home. INR subtherapeutic at 1.23>1.19, with goal INR of 2.5-3. Troponins negative at 0.04, 0.04, 0.05, 0.04.  Patient is on heparin gtt with bridge to coumadin.  -Cardiology following, appreciate recommedations -ASA 81 mg daily -Metoprolol 50 mg BID -Echo pending -Heparin gtt -Warfarin 5 mg po   Chronic Grade 2 Diastolic Dysfunction: Echo on 01/03/14  showed an EF of 55-60% and grade 2 diastolic dysfunction. Patient is on ASA 81 mg daily and metoprolol at home.  -ASA 81 mg daily -Metoprolol 50 mg BID -Daily weights -I/Os -Echo pending  ESRD on HD TTS: Patient is on HD TTS. She is on Phoslo TID, Sensipar, Velphoro and Renvela at home. -Phoslo TID -Calcium Carbonate 200-400 mg 1-2 tabs daily -Sensipar 30 mg daily -Consulted Nephrology to continue HD TTS -Repeat renal function panel tomorrow am  Hepatitis C s/p Liver Transplant: Patient had a liver transplant in 2010. AST 52>36 (baseline 25), ALT 33>29 (baseline 20). Patient is on Prograf 3 mg BID at home. -Tacrolimus 3 mg BID -Repeat renal function panel tomorrow am  Hypothyroidism: TSH 2.242, normal. Patient is on levothyroxine 150 mcg at home.  -Continue Levothyroxine 150 mcg daily  T2DM: HgbA1c 6.3 on 12/15/13. Patient is on levemir 2-5 units at home. Glucose has been well controlled.  -Gabapentin 300 mg QHS -CBGs AC/HS -SSI-S  HTN: BP 130s-150s/40-50s. Patient is on metoprolol at home.  -Continue Metoprolol 50 mg BID  DVT/PE ppx: Heparin gtt  Dispo: Disposition is deferred at this time, awaiting improvement of current medical problems.  Anticipated discharge in approximately 1-2 day(s).   The patient does have a current PCP (No primary care provider on file.) and does not need an [facility] hospital follow-up appointment after discharge.  The patient does not have transportation limitations that hinder transportation to clinic appointments.  .Services Needed at time of discharge: Y = Yes, Blank = No PT:   OT:   RN:   Equipment:   Other:     LOS: 1 day   Osa Craver, DO PGY-1 Internal Medicine Resident Pager # [phone] 02/20/2014 2:52 PM

## 2014-02-20 NOTE — Progress Notes (Signed)
[facility] for warfarin and heparin Indication: Mechanical heart valve - AVR  Allergies  Allergen Reactions  . Acetaminophen Other (See Comments)    Liver transplant recipient   . Codeine Itching  . Mirtazapine Other (See Comments)    hallucination  . Morphine Itching    Patient Measurements: Height: 5\' 4"  (162.6 cm) Weight: 170 lb 3.1 oz (77.2 kg) IBW/kg (Calculated) : 54.7 Heparin Dosing Weight: 70 kg  Vital Signs: Temp: 97.9 F (36.6 C) (01/11 0655) Temp Source: Oral (01/11 0655) BP: 139/52 mmHg (01/11 0947) Pulse Rate: 66 (01/11 0947)  Labs:  Recent Labs  02/19/14 0243 02/19/14 0537 02/19/14 0700 02/19/14 1200 02/19/14 1836 02/20/14 0345  HGB 12.8  --   --   --   --  12.0  HCT 39.6  --   --   --   --  37.2  PLT 103*  --   --   --   --  107*  LABPROT  --   --  15.6*  --   --  15.3*  INR  --   --  1.23  --   --  1.19  HEPARINUNFRC  --   --   --   --  0.43 0.34  CREATININE 6.46*  --   --   --   --  8.86*  TROPONINI 0.04* 0.04*  --  0.04* 0.05*  --     Estimated Creatinine Clearance: 6.9 mL/min (by C-G formula based on Cr of 8.86).   Medications:  See home med list.   Home warfarin dose is 3mg  daily  Assessment: 59 year old woman in warfarin prior to admission for a mechanical AVR. INR on admission is subtherapeutic and remains subtherapeutic today  Heparin is therapeutic 0.34 units/ml  Goal of Therapy:  INR 2.5-3 HL 0.3-0.7 Monitor platelets by anticoagulation protocol: Yes   Plan:  Continue heparin at 1000 units / hr Coumadin 5 mg po today Follow up AM labs  Thanks for allowing pharmacy to be a part of this patient's care.  Excell Seltzer, PharmD Clinical Pharmacist, [phone] 02/20/2014,12:26 PM

## 2014-02-20 NOTE — Progress Notes (Signed)
Patient Name: [name] Date of Encounter: 02/20/2014     Principal Problem:   Paroxysmal supraventricular tachycardia, s/p valve replacement, with chest pain. 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 No CP  CURRENT MEDS . aspirin EC  81 mg Oral Daily  . aspirin  325 mg Oral Once  . calcium acetate  2,001 mg Oral TID WC  . calcium carbonate  1-2 tablet Oral Daily  . cinacalcet  30 mg Oral Q lunch  . gabapentin  300 mg Oral QHS  . gi cocktail  30 mL Oral Once  . insulin aspart  0-9 Units Subcutaneous TID WC  . levothyroxine  150 mcg Oral QAC breakfast  . metoprolol  50 mg Oral BID  . sodium chloride  3 mL Intravenous Q12H  . tacrolimus  3 mg Oral BID  . Warfarin - Pharmacist Dosing Inpatient   Does not apply q1800    OBJECTIVE  Filed Vitals:   02/19/14 1732 02/19/14 2206 02/20/14 0655 02/20/14 0947  BP: 175/58 152/47 131/43 139/52  Pulse: 63 62 59 66  Temp: 98.1 F (36.7 C) 98.3 F (36.8 C) 97.9 F (36.6 C)   TempSrc: Oral Oral Oral   Resp:  15 16   Height: 5\' 4"  (1.626 m)     Weight:   170 lb 3.1 oz (77.2 kg)   SpO2: 100% 100% 100%     Intake/Output Summary (Last 24 hours) at 02/20/14 1057 Last data filed at 02/20/14 1054  Gross per 24 hour  Intake    510 ml  Output      1 ml  Net    509 ml   Filed Weights   02/19/14 0148 02/20/14 0655  Weight: 165 lb (74.844 kg) 170 lb 3.1 oz (77.2 kg)    PHYSICAL EXAM  General: Vital signs reviewed. Patient is well-developed and well-nourished, in no acute distress and cooperative with exam. HEENT: Alopecia patches on scalp. Neck is supple, no carotid bruit present.  Cardiovascular: RRR, S1 normal, S2 normal Pulmonary/Chest:  Clear to auscultation bilaterally, no wheezes, rales, or rhonchi. Abdominal: Soft, non-tender, non-distended, BS +, large, well-healed surgical scar.  Extremities: AVF in place in left lower extremity on anterior superior thigh with good thrill and bruit. No lower extremity edema bilaterally Skin: Warm, dry and intact. Psychiatric: Normal mood and affect. speech and behavior is normal. Cognition and memory are normal.   Accessory Clinical Findings  CBC  Recent Labs  02/19/14 0243 02/20/14 0345  WBC 4.1 5.8  NEUTROABS 2.2  --   HGB 12.8 12.0  HCT 39.6 37.2  MCV 98.3 98.4  PLT 103* 094*   Basic Metabolic Panel  Recent Labs  02/19/14 0243 02/19/14 0620 02/20/14 0345  NA 137  --  135  K 3.8  --  4.8  CL 93*  --  95*  CO2 28  --  30  GLUCOSE 160*  --  145*  BUN 23  --  43*  CREATININE 6.46*  --  8.86*  CALCIUM 8.3*  --  7.9*  MG  --  2.2  --    Liver Function Tests  Recent Labs  02/19/14 0243 02/20/14 0345  AST 52* 36  ALT 33 29  ALKPHOS 82 76  BILITOT 0.6 0.6  PROT 6.9 6.5  ALBUMIN 2.8* 2.6*    Cardiac Enzymes  Recent Labs  02/19/14 0537 02/19/14 1200 02/19/14 1836  TROPONINI 0.04* 0.04* 0.05*   Thyroid Function Tests  Recent Labs  02/19/14 1218  TSH 2.242    TELE  NSR HR 60s currently. No PSVT today .  Radiology/Studies  Dg Chest 2 View  02/19/2014   CLINICAL DATA:  Sharp chest pain with palpitations, headache, and shortness of breath. Currently on warfarin. Smoker. Dialysis patient.  EXAM: CHEST  2 VIEW  COMPARISON:  01/03/2014  FINDINGS: Postoperative changes in the mediastinum, lower lumbar spine, and upper abdomen. Cardiac valve prosthesis. The heart size and mediastinal contours are within normal limits. Both lungs are clear. The visualized skeletal structures are unremarkable. Calcification of the aorta.  IMPRESSION: No active cardiopulmonary disease.   Electronically Signed   By: Lucienne Capers M.D.   On: 02/19/2014 02:58     ASSESSMENT AND PLAN  Ms. Shiel is a 59 yo female with PMHx of CAD s/p CABG, AS s/p mechanical AVR, ESRD on HD TTS, Hepatitis C s/p Liver transplant, T2DM, COPD, HTN, Hypothyroidism, and GERD who presented to [facility] 02/19/13 with complaint of chest pain and palpitations.   Chest pain-this is been an ongoing problem and she recently had cardiac catheterization in 12/2013. This showed patent graft with 70% mid LAD stenosis that was bypassed.  --Troponin mildly elevated at 0.04--> 0.04--> 0.04 --> 0.05. -- Echo in 12/2013 showed EF 55-60% with normal mechanical valve function, however her INR is only 1.23.  -- Repeat 2D echo to evaluate for any mechanical valve obstruction pending.   SOB- she complains of SOB and orthopnea today. CXR clear. Ddimer normal. No tachycardia or hypoxia. 2D ECHO pending.   PSVT-she has a history of this after valve replacement surgery. -- She is currently converted back to sinus rhythm. No further episodes today -- Continue metoprolol 50mg  BID  Mechanical aVR- she has a [employer] mechanical aortic valve replacement. She was supposed to be on warfarin at home and reports that she has been taking it although her INR today was only 1.23.  -- Pharmacy consulted and bridging with heparin.  -- Will recheck echocardiogram to evaluate for mechanical valve obstruction.  Nausea/vomiting- we will defer to medicine service, could be related to gastroparesis, dialysis, gastroenteritis and/or number of other medical issues. Her white blood cell count is normal. AST is mildly elevated. BUN is only 23, but creatinine is 6.46.  Judy Pimple PA-C  Pager [phone]

## 2014-02-21 DIAGNOSIS — R079 Chest pain, unspecified: Secondary | ICD-10-CM

## 2014-02-21 DIAGNOSIS — I350 Nonrheumatic aortic (valve) stenosis: Secondary | ICD-10-CM

## 2014-02-21 LAB — CBC
HCT: 35.5 % — ABNORMAL LOW (ref 36.0–46.0)
HCT: 35.3 % — ABNORMAL LOW (ref 36.0–46.0)
Hemoglobin: 11.3 g/dL — ABNORMAL LOW (ref 12.0–15.0)
Hemoglobin: 11.3 g/dL — ABNORMAL LOW (ref 12.0–15.0)
MCH: 31.9 pg (ref 26.0–34.0)
MCH: 32.2 pg (ref 26.0–34.0)
MCHC: 31.8 g/dL (ref 30.0–36.0)
MCHC: 32 g/dL (ref 30.0–36.0)
MCV: 100.3 fL — ABNORMAL HIGH (ref 78.0–100.0)
MCV: 100.6 fL — ABNORMAL HIGH (ref 78.0–100.0)
PLATELETS: 80 10*3/uL — AB (ref 150–400)
Platelets: 90 10*3/uL — ABNORMAL LOW (ref 150–400)
RBC: 3.54 MIL/uL — ABNORMAL LOW (ref 3.87–5.11)
RBC: 3.51 MIL/uL — ABNORMAL LOW (ref 3.87–5.11)
RDW: 13.9 % (ref 11.5–15.5)
RDW: 14.1 % (ref 11.5–15.5)
WBC: 4.4 10*3/uL (ref 4.0–10.5)
WBC: 4.3 10*3/uL (ref 4.0–10.5)

## 2014-02-21 LAB — RENAL FUNCTION PANEL
ALBUMIN: 2.5 g/dL — AB (ref 3.5–5.2)
Anion gap: 11 (ref 5–15)
BUN: 64 mg/dL — ABNORMAL HIGH (ref 6–23)
CALCIUM: 7.2 mg/dL — AB (ref 8.4–10.5)
CO2: 26 mmol/L (ref 19–32)
Chloride: 96 mEq/L (ref 96–112)
Creatinine, Ser: 11.05 mg/dL — ABNORMAL HIGH (ref 0.50–1.10)
GFR, EST AFRICAN AMERICAN: 4 mL/min — AB (ref >90)
GFR, EST NON AFRICAN AMERICAN: 3 mL/min — AB (ref >90)
Glucose, Bld: 137 mg/dL — ABNORMAL HIGH (ref 70–99)
PHOSPHORUS: 6.5 mg/dL — AB (ref 2.3–4.6)
POTASSIUM: 4.6 mmol/L (ref 3.5–5.1)
SODIUM: 133 mmol/L — AB (ref 135–145)

## 2014-02-21 LAB — GLUCOSE, CAPILLARY
GLUCOSE-CAPILLARY: 142 mg/dL — AB (ref 70–99)
Glucose-Capillary: 134 mg/dL — ABNORMAL HIGH (ref 70–99)
Glucose-Capillary: 120 mg/dL — ABNORMAL HIGH (ref 70–99)
Glucose-Capillary: 183 mg/dL — ABNORMAL HIGH (ref 70–99)

## 2014-02-21 LAB — PROTIME-INR
INR: 1.62 — ABNORMAL HIGH (ref 0.00–1.49)
Prothrombin Time: 19.3 seconds — ABNORMAL HIGH (ref 11.6–15.2)

## 2014-02-21 LAB — HEPARIN LEVEL (UNFRACTIONATED): Heparin Unfractionated: 0.33 IU/mL (ref 0.30–0.70)

## 2014-02-21 MED ORDER — WARFARIN SODIUM 4 MG PO TABS
4.0000 mg | ORAL_TABLET | Freq: Once | ORAL | Status: AC
  Administered 2014-02-21: 4 mg via ORAL
  Filled 2014-02-21 (×2): qty 1

## 2014-02-21 MED ORDER — HEPARIN (PORCINE) IN NACL 100-0.45 UNIT/ML-% IJ SOLN
1100.0000 [IU]/h | INTRAMUSCULAR | Status: DC
  Administered 2014-02-21 – 2014-02-22 (×2): 1000 [IU]/h via INTRAVENOUS
  Administered 2014-02-23 (×2): 1100 [IU]/h via INTRAVENOUS
  Filled 2014-02-21 (×5): qty 250

## 2014-02-21 MED ORDER — HEPARIN SODIUM (PORCINE) 1000 UNIT/ML DIALYSIS: 1000.0000 [IU] | INTRAMUSCULAR | Status: DC | PRN

## 2014-02-21 MED ORDER — SODIUM CHLORIDE 0.9 % IV SOLN: 100.0000 mL | INTRAVENOUS | Status: DC | PRN

## 2014-02-21 MED ORDER — ALTEPLASE 2 MG IJ SOLR
2.0000 mg | Freq: Once | INTRAMUSCULAR | Status: DC | PRN
  Filled 2014-02-21: qty 2

## 2014-02-21 MED ORDER — LIDOCAINE-PRILOCAINE 2.5-2.5 % EX CREA: 1.0000 "application " | TOPICAL_CREAM | CUTANEOUS | Status: DC | PRN

## 2014-02-21 MED ORDER — LIDOCAINE HCL (PF) 1 % IJ SOLN: 5.0000 mL | INTRAMUSCULAR | Status: DC | PRN

## 2014-02-21 MED ORDER — TRAMADOL HCL 50 MG PO TABS
ORAL_TABLET | ORAL | Status: AC
  Administered 2014-02-21: 50 mg via ORAL
  Filled 2014-02-21: qty 1

## 2014-02-21 MED ORDER — PENTAFLUOROPROP-TETRAFLUOROETH EX AERO: 1.0000 "application " | INHALATION_SPRAY | CUTANEOUS | Status: DC | PRN

## 2014-02-21 MED ORDER — NEPRO/CARBSTEADY PO LIQD: 237.0000 mL | ORAL | Status: DC | PRN

## 2014-02-21 NOTE — Progress Notes (Signed)
Subjective:  Patient was seen and examined this morning in HD. She states she had an episode of rapid HR during her echo yesterday that last for 2-3 minutes but was not associated with diaphoresis, nausea or vomiting. Patient denies any chest pain or shortness of breath currently. Tolerating HD well.  Objective: Vital signs in last 24 hours: Filed Vitals:   02/21/14 1019 02/21/14 1044 02/21/14 1105 02/21/14 1115  BP: 104/49 138/58 135/50 121/43  Pulse: 59 61 62 61  Temp:  97.9 F (36.6 C) 98 F (36.7 C) 97.7 F (36.5 C)  TempSrc:  Oral Oral Oral  Resp: 16 15 16 16   Height:      Weight:  76.6 kg (168 lb 14 oz)    SpO2:  98% 100% 100%   Weight change: 1.3 kg (2 lb 13.9 oz)  Intake/Output Summary (Last 24 hours) at 02/21/14 1145 Last data filed at 02/21/14 1044  Gross per 24 hour  Intake    860 ml  Output   1930 ml  Net  -1070 ml   General: Vital signs reviewed. Patient is well-developed and well-nourished, in no acute distress and cooperative with exam.  HEENT: Alopecia patches on scalp. Neck is supple, no carotid bruit present.  Cardiovascular: RRR, S1 normal, S2 normal Pulmonary/Chest: Clear to auscultation bilaterally, no wheezes, rales, or rhonchi. Abdominal: Soft, non-tender, non-distended, BS +, large, well-healed surgical scar.  Extremities: AVF in place in left lower extremity in use with HD. No lower extremity edema bilaterally Skin: Warm, dry and intact. Psychiatric: Normal mood and affect. Speech and behavior is normal. Cognition and memory are normal.   Lab Results: Basic Metabolic Panel:  Recent Labs Lab 02/19/14 0620  02/20/14 1618 02/21/14 0647  NA  --   < > 136 133*  K  --   < > 5.0 4.6  CL  --   < > 98 96  CO2  --   < > 29 26  GLUCOSE  --   < > 105* 137*  BUN  --   < > 54* 64*  CREATININE  --   < > 9.95* 11.05*  CALCIUM  --   < > 7.8* 7.2*  MG 2.2  --   --   --   PHOS  --   --  7.1* 6.5*  < > = values in this interval not displayed. Liver  Function Tests:  Recent Labs Lab 02/19/14 0243 02/20/14 0345 02/20/14 1618 02/21/14 0647  AST 52* 36  --   --   ALT 33 29  --   --   ALKPHOS 82 76  --   --   BILITOT 0.6 0.6  --   --   PROT 6.9 6.5  --   --   ALBUMIN 2.8* 2.6* 2.7* 2.5*   CBC:  Recent Labs Lab 02/19/14 0243  02/21/14 0500 02/21/14 0603  WBC 4.1  < > 4.4 4.3  NEUTROABS 2.2  --   --   --   HGB 12.8  < > 11.3* 11.3*  HCT 39.6  < > 35.5* 35.3*  MCV 98.3  < > 100.3* 100.6*  PLT 103*  < > 90* 80*  < > = values in this interval not displayed. Cardiac Enzymes:  Recent Labs Lab 02/19/14 0537 02/19/14 1200 02/19/14 1836  TROPONINI 0.04* 0.04* 0.05*   CBG:  Recent Labs Lab 02/20/14 0627 02/20/14 1128 02/20/14 1708 02/20/14 2159 02/21/14 0639 02/21/14 1118  GLUCAP 121* 129* 86 205* 134*  120*   Thyroid Function Tests:  Recent Labs Lab 02/19/14 1218  TSH 2.242   Coagulation:  Recent Labs Lab 02/19/14 0700 02/20/14 0345 02/21/14 0500  LABPROT 15.6* 15.3* 19.3*  INR 1.23 1.19 1.62*   Studies/Results: No results found. Medications:  I have reviewed the patient's current medications. Prior to Admission:  Prescriptions prior to admission  Medication Sig Dispense Refill Last Dose  . albuterol (PROVENTIL,VENTOLIN) 90 MCG/ACT inhaler Inhale 1-2 puffs into the lungs every 6 (six) hours as needed for wheezing or shortness of breath.    Past Month at Unknown time  . aspirin EC 81 MG tablet Take 81 mg by mouth daily.     02/18/2014 at Unknown time  . calcium acetate (PHOSLO) 667 MG capsule Take 2,001 mg by mouth 3 (three) times daily with meals.   02/18/2014 at Unknown time  . calcium carbonate (TUMS - DOSED IN MG ELEMENTAL CALCIUM) 500 MG chewable tablet Chew 1-2 tablets by mouth daily.    02/18/2014 at Unknown time  . cinacalcet (SENSIPAR) 30 MG tablet Take 30 mg by mouth daily.   02/18/2014 at Unknown time  . gabapentin (NEURONTIN) 300 MG capsule Take 300 mg by mouth at bedtime.    02/18/2014 at Unknown  time  . insulin detemir (LEVEMIR) 100 UNIT/ML injection Inject 2-5 Units into the skin daily as needed (blood). 150-200 do 2 units >200 do 5 units   last month  . levothyroxine (SYNTHROID, LEVOTHROID) 150 MCG tablet Take 150 mcg by mouth daily before breakfast.   02/18/2014 at Unknown time  . LORazepam (ATIVAN) 0.5 MG tablet Take 0.5 mg by mouth at bedtime as needed for sleep.    Past Month at Unknown time  . metoprolol (LOPRESSOR) 50 MG tablet On non dialysis days take 1 & 1/2 tablet in the morning and 1 tablet in the evening. On dialysis days take 1 tablet twice daily 180 tablet 3 02/18/2014 at 2300  . ondansetron (ZOFRAN-ODT) 4 MG disintegrating tablet Take 4 mg by mouth every 8 (eight) hours as needed for nausea or vomiting.   unknown  . polyethylene glycol (MIRALAX / GLYCOLAX) packet Take 17 g by mouth daily as needed for mild constipation.    Past Month at Unknown time  . promethazine (PHENERGAN) 12.5 MG tablet Take 12.5 mg by mouth every 4 (four) hours as needed for nausea.    unknown  . tacrolimus (PROGRAF) 1 MG capsule Take 3 mg by mouth 2 (two) times daily.    02/18/2014 at Unknown time  . traMADol (ULTRAM) 50 MG tablet Take 50 mg by mouth every 12 (twelve) hours as needed for moderate pain.    unknown  . warfarin (COUMADIN) 3 MG tablet Take 3 mg by mouth daily.   02/18/2014 at Unknown time  . Multiple Vitamin (MULTIVITAMIN WITH MINERALS) TABS tablet Take 1 tablet by mouth at bedtime.    Past Week at Unknown time  . RENVELA 2.4 G PACK Take 2.4 g by mouth 3 (three) times daily with meals.    01/02/2014 at Unknown time  . VELPHORO 500 MG chewable tablet Chew 1 tablet by mouth 3 (three) times daily with meals.    01/02/2014 at Unknown time   Scheduled Meds: . aspirin EC  81 mg Oral Daily  . aspirin  325 mg Oral Once  . calcium acetate  2,001 mg Oral TID WC  . calcium carbonate  1-2 tablet Oral Daily  . cinacalcet  30 mg Oral Q lunch  .  gabapentin  300 mg Oral QHS  . gi cocktail  30 mL Oral Once   . insulin aspart  0-9 Units Subcutaneous TID WC  . levothyroxine  150 mcg Oral QAC breakfast  . metoprolol  50 mg Oral BID  . sodium chloride  3 mL Intravenous Q12H  . tacrolimus  3 mg Oral BID  . warfarin  4 mg Oral ONCE-1800  . Warfarin - Pharmacist Dosing Inpatient   Does not apply q1800   Continuous Infusions: . heparin 1,000 Units/hr (02/21/14 0930)   PRN Meds:.albuterol, LORazepam, polyethylene glycol, promethazine, traMADol Assessment/Plan: Principal Problem:   Paroxysmal supraventricular tachycardia, s/p valve replacement, with chest pain. resolved as outpatient Active Problems:   DM2 (diabetes mellitus, type 2)   End stage renal disease on dialysis-TTS   Dyslipidemia-LDL 104, not on statin with Hx of liver transplant   Chest pain   S/P mechanical AVR Feb 2015   S/P CABG x 1, LIMA-LAD 03/2013   Nausea and vomiting   S/P liver transplant   Chronic hepatitis C without hepatic coma   Diastolic CHF, chronic   Thyroid activity decreased  Paroxysmal SVT: Patient has remained sinus overnight, but admits to rapid heart rate yesterday during echo not seen on telemetry or on vital signs documented. HR 50-60s this morning. Echo showed EF of 55% and Aortic Valve with normal systolic function and mild regurgitation.  -Cardiology following, appreciate recommendations -ASA 81 mg dailyl -Metoprolol 50 mg BID -Phenergan 12.5 mg Q4H prn -Cardiac monitoring -Cardiac diet  Chest Pain in setting of CAD s/p CABG and Aortic Valve Replacement:  Denies chest pain or shortness of breath. Patient is on heparin gtt with bridge to coumadin. INR 1.62 this morning.  -Cardiology following, appreciate recommedations -ASA 81 mg daily -Metoprolol 50 mg BID -Heparin gtt -Warfarin 4 mg po today  Chronic Grade 2 Diastolic Dysfunction: Echo on 01/03/14 showed an EF of 55-60% and grade 2 diastolic dysfunction. Repeat similar. Patient is on ASA 81 mg daily and metoprolol at home.  -ASA 81 mg  daily -Metoprolol 50 mg BID -Daily weights -I/Os  ESRD on HD TTS: Patient is on HD TTS. She is on Phoslo TID, Sensipar, Velphoro and Renvela at home. HD today. -Phoslo TID -Calcium Carbonate 200-400 mg 1-2 tabs daily -Sensipar 30 mg daily -Repeat renal function panel tomorrow am  T2DM: HgbA1c 6.3 on 12/15/13. Patient is on levemir 2-5 units at home. Glucose has been well controlled.  -Gabapentin 300 mg QHS -CBGs AC/HS -SSI-S  HTN: BP 120s-120s/40-60s. On episode of 88/40 this morning during HD. Asymptomatic. Patient is on metoprolol at home.  -Continue Metoprolol 50 mg BID  DVT/PE ppx: Heparin gtt  Dispo: Disposition is deferred at this time, awaiting improvement of current medical problems.  Anticipated discharge in approximately 1-2 day(s).   The patient does have a current PCP (No primary care provider on file.) and does not need an [facility] hospital follow-up appointment after discharge.  The patient does not have transportation limitations that hinder transportation to clinic appointments.  .Services Needed at time of discharge: Y = Yes, Blank = No PT:   OT:   RN:   Equipment:   Other:     LOS: 2 days   Osa Craver, DO PGY-1 Internal Medicine Resident Pager # [phone] 02/21/2014 11:45 AM

## 2014-02-21 NOTE — Progress Notes (Signed)
Patient ID: [name], female   DOB: 1955/03/08, 59 y.o.   MRN: [id number] Medicine attending: I personally examined this patient today together with resident physician Dr. Osa Craver and I concur with her evaluation and management plan. Patient has some residual chest discomfort but no significant pain. No further episodes of SVT have been documented. She appears to be on optimal doses of a beta blocker. Echocardiogram shows no problems with her prosthetic aortic valve. She remains on heparin while Coumadin being titrated back into the herpetic range. She continues on dialysis support.

## 2014-02-21 NOTE — Procedures (Signed)
I was present at this dialysis session, have reviewed the session itself and made  appropriate changes  [name] MD (pgr) [phone]    [phone] 02/21/2014, 9:50 AM

## 2014-02-21 NOTE — Progress Notes (Signed)
[place] for warfarin and heparin Indication: Mechanical heart valve - AVR  Allergies  Allergen Reactions  . Acetaminophen Other (See Comments)    Liver transplant recipient   . Codeine Itching  . Mirtazapine Other (See Comments)    hallucination  . Morphine Itching    Patient Measurements: Height: 5\' 4"  (162.6 cm) Weight: 168 lb 14 oz (76.6 kg) IBW/kg (Calculated) : 54.7 Heparin Dosing Weight: 70 kg  Vital Signs: Temp: 97.9 F (36.6 C) (01/12 1044) Temp Source: Oral (01/12 1044) BP: 138/58 mmHg (01/12 1044) Pulse Rate: 61 (01/12 1044)  Labs:  Recent Labs  02/19/14 0537 02/19/14 0700 02/19/14 1200 02/19/14 1836 02/20/14 0345 02/20/14 1618 02/21/14 0500 02/21/14 0603 02/21/14 0647  HGB  --   --   --   --  12.0  --  11.3* 11.3*  --   HCT  --   --   --   --  37.2  --  35.5* 35.3*  --   PLT  --   --   --   --  107*  --  90* 80*  --   LABPROT  --  15.6*  --   --  15.3*  --  19.3*  --   --   INR  --  1.23  --   --  1.19  --  1.62*  --   --   HEPARINUNFRC  --   --   --  0.43 0.34  --  0.33  --   --   CREATININE  --   --   --   --  8.86* 9.95*  --   --  11.05*  TROPONINI 0.04*  --  0.04* 0.05*  --   --   --   --   --     Estimated Creatinine Clearance: 5.5 mL/min (by C-G formula based on Cr of 11.05).   Medications:  See home med list.   Home warfarin dose is 3mg  daily  Assessment: 59 year old woman in warfarin prior to admission for a mechanical AVR. INR on admission is subtherapeutic and remains subtherapeutic today although trending up.  Heparin is therapeutic 0.33 units/ml  Goal of Therapy:  INR 2.5-3 HL 0.3-0.7 Monitor platelets by anticoagulation protocol: Yes   Plan:  Continue heparin at 1000 units / hr Coumadin 4 mg po today Follow up AM labs  Thanks for allowing pharmacy to be a part of this patient's care.  Excell Seltzer, PharmD Clinical Pharmacist, [phone] 02/21/2014,11:03 AM

## 2014-02-21 NOTE — Progress Notes (Signed)
Patient Name: [name] Date of Encounter: 02/21/2014     Principal Problem:   Paroxysmal supraventricular tachycardia, s/p valve replacement, with chest pain. resolved as outpatient Active Problems:   DM2 (diabetes mellitus, type 2)   End stage renal disease on dialysis-TTS   Dyslipidemia-LDL 104, not on statin with Hx of liver transplant   Chest pain   S/P mechanical AVR Feb 2015   S/P CABG x 1, LIMA-LAD 03/2013   Nausea and vomiting   S/P liver transplant   Chronic hepatitis C without hepatic coma   Diastolic CHF, chronic   Thyroid activity decreased    SUBJECTIVE  No CP or SOB. Seen in HD. Sleepy  CURRENT MEDS . aspirin EC  81 mg Oral Daily  . aspirin  325 mg Oral Once  . calcium acetate  2,001 mg Oral TID WC  . calcium carbonate  1-2 tablet Oral Daily  . cinacalcet  30 mg Oral Q lunch  . gabapentin  300 mg Oral QHS  . gi cocktail  30 mL Oral Once  . insulin aspart  0-9 Units Subcutaneous TID WC  . levothyroxine  150 mcg Oral QAC breakfast  . metoprolol  50 mg Oral BID  . sodium chloride  3 mL Intravenous Q12H  . tacrolimus  3 mg Oral BID  . Warfarin - Pharmacist Dosing Inpatient   Does not apply q1800    OBJECTIVE  Filed Vitals:   02/21/14 0735 02/21/14 0802 02/21/14 0830 02/21/14 0900  BP: 152/56 153/61 142/60 127/59  Pulse: 59 55 64 58  Temp:      TempSrc:      Resp: 15 17 19 16   Height:      Weight:      SpO2:        Intake/Output Summary (Last 24 hours) at 02/21/14 0935 Last data filed at 02/21/14 0918  Gross per 24 hour  Intake   1100 ml  Output      1 ml  Net   1099 ml   Filed Weights   02/19/14 0148 02/20/14 0655 02/21/14 0628  Weight: 165 lb (74.844 kg) 170 lb 3.1 oz (77.2 kg) 173 lb 1 oz (78.5 kg)    PHYSICAL EXAM  General: Vital signs reviewed. Patient is well-developed and well-nourished, in no acute distress and cooperative with exam. HEENT: Alopecia patches on scalp. Neck is supple, no carotid bruit present.   Cardiovascular: RRR, S1 normal, S2 normal Pulmonary/Chest: Clear to auscultation bilaterally, no wheezes, rales, or rhonchi. Abdominal: Soft, non-tender, non-distended, BS +, large, well-healed surgical scar.  Extremities: AVF in place in left lower extremity on anterior superior thigh with good thrill and bruit. No lower extremity edema bilaterally Skin: Warm, dry and intact. Psychiatric: Normal mood and affect. speech and behavior is normal. Cognition and memory are normal.   Accessory Clinical Findings  CBC  Recent Labs  02/19/14 0243  02/21/14 0500 02/21/14 0603  WBC 4.1  < > 4.4 4.3  NEUTROABS 2.2  --   --   --   HGB 12.8  < > 11.3* 11.3*  HCT 39.6  < > 35.5* 35.3*  MCV 98.3  < > 100.3* 100.6*  PLT 103*  < > 90* 80*  < > = values in this interval not displayed. Basic Metabolic Panel  Recent Labs  02/19/14 0620  02/20/14 1618 02/21/14 0647  NA  --   < > 136 133*  K  --   < > 5.0 4.6  CL  --   < >  98 96  CO2  --   < > 29 26  GLUCOSE  --   < > 105* 137*  BUN  --   < > 54* 64*  CREATININE  --   < > 9.95* 11.05*  CALCIUM  --   < > 7.8* 7.2*  MG 2.2  --   --   --   PHOS  --   --  7.1* 6.5*  < > = values in this interval not displayed. Liver Function Tests  Recent Labs  02/19/14 0243 02/20/14 0345 02/20/14 1618 02/21/14 0647  AST 52* 36  --   --   ALT 33 29  --   --   ALKPHOS 82 76  --   --   BILITOT 0.6 0.6  --   --   PROT 6.9 6.5  --   --   ALBUMIN 2.8* 2.6* 2.7* 2.5*    Cardiac Enzymes  Recent Labs  02/19/14 0537 02/19/14 1200 02/19/14 1836  TROPONINI 0.04* 0.04* 0.05*   Thyroid Function Tests  Recent Labs  02/19/14 1218  TSH 2.242    TELE  NSR HR 60s currently. No PSVT today .  Radiology/Studies  Dg Chest 2 View  02/19/2014   CLINICAL DATA:  Sharp chest pain with palpitations, headache, and shortness of breath. Currently on warfarin. Smoker. Dialysis patient.  EXAM: CHEST  2 VIEW  COMPARISON:  01/03/2014  FINDINGS: Postoperative  changes in the mediastinum, lower lumbar spine, and upper abdomen. Cardiac valve prosthesis. The heart size and mediastinal contours are within normal limits. Both lungs are clear. The visualized skeletal structures are unremarkable. Calcification of the aorta.  IMPRESSION: No active cardiopulmonary disease.   Electronically Signed   By: Lucienne Capers M.D.   On: 02/19/2014 02:58   2D ECHO Study Date: 02/20/2014 LV EF: 55% Study Conclusions - Left ventricle: The cavity size was mildly dilated. Wall   thickness was normal. The estimated ejection fraction was 55%.   Doppler parameters are consistent with elevated ventricular   end-diastolic filling pressure. - Aortic valve: Mechanical AVR Normal systolic gradinet Mild   perivalvular regurgitation and also mild closing volume more   central AR seen Valve area (VTI): 1.87 cm^2. Valve area (Vmax):   1.94 cm^2. Valve area (Vmean): 1.7 cm^2. - Mitral valve: There was mild regurgitation. - Left atrium: The atrium was mildly dilated. - Atrial septum: No defect or patent foramen ovale was identified  ASSESSMENT AND PLAN  [name] is a 59 yo female with PMHx of CAD s/p CABG, AS s/p mechanical AVR, ESRD on HD TTS, Hepatitis C s/p Liver transplant, T2DM, COPD, HTN, Hypothyroidism, and GERD who presented to [facility] 02/19/13 with complaint of chest pain and palpitations.   Chest pain-this is been an ongoing problem and she recently had cardiac catheterization in 12/2013. This showed patent graft with 70% mid LAD stenosis that was bypassed.  --Troponin mildly elevated at 0.04--> 0.04--> 0.04 --> 0.05. -- No subsequent CP  PSVT-she has a history of this after valve replacement surgery. -- She is currently converted back to sinus rhythm. No further episodes today -- Continue metoprolol 50mg  BID -- Cannot review tele as she in HD  Mechanical aVR- she has a [employer] mechanical aortic valve replacement. She was supposed to be on warfarin at home and  reports that she has been taking it although her INR today was only 1.23.  -- Pharmacy consulted and bridging with heparin.  --  Repeat 2D echo w/  Mechanical AVR Normal systolic gradinet. Mild perivalvular regurgitation and also mild closing volume more central AR seen Valve area (VTI): 1.87 cm^2. Valve area (Vmax): 1.94 cm^2. Valve area (Vmean): 1.7 cm^2.   Nausea/vomiting- we will defer to medicine service, could be related to gastroparesis, dialysis, gastroenteritis and/or number of other medical issues.  Judy Pimple PA-C  Pager [phone]

## 2014-02-21 NOTE — Progress Notes (Signed)
  [facility] Progress Note   Subjective: No complaints  Filed Vitals:   02/21/14 0735 02/21/14 0802 02/21/14 0830 02/21/14 0900  BP: 152/56 153/61 142/60 127/59  Pulse: 59 55 64 58  Temp:      TempSrc:      Resp: 15 17 19 16   Height:      Weight:      SpO2:        Exam Alert, no distress No jvd Chest clear bilat RRR soft SEM, metallic valve click, no RG Sternotomy scar w keloid Abd soft, NTND, no mass or ascites No LE edema L thigh AVG patent, no sign of infection Neuro is nf, Ox 3  HD: TTS [name] 3h 72min   400/A1.5   76.5kg   2/2.0 [place]   Heparin none Calcitriol 0.5 ug TIW   Assessment: 1. SVT - converted to NSR in ED 2. Chest pain - had neg cath Nov '15, chronic issue, on medical Rx 3. S/P CABGx1/AVR in Feb 2015 - on IV hep, coumadin adjustment d/t low INR 4. ESRD on HD 5. HTN / vol - no vol excess, on MTP as at home 6. Anemia Hb 11 , no esa 7. Liver transplant - on IS medication 8. DM 2 on levimir  Plan- HD today, UF to dry wt    [name] MD  pager [phone]    cell [phone]  02/21/2014, 9:38 AM     Recent Labs Lab 02/20/14 0345 02/20/14 1618 02/21/14 0647  NA 135 136 133*  K 4.8 5.0 4.6  CL 95* 98 96  CO2 30 29 26   GLUCOSE 145* 105* 137*  BUN 43* 54* 64*  CREATININE 8.86* 9.95* 11.05*  CALCIUM 7.9* 7.8* 7.2*  PHOS  --  7.1* 6.5*    Recent Labs Lab 02/19/14 0243 02/20/14 0345 02/20/14 1618 02/21/14 0647  AST 52* 36  --   --   ALT 33 29  --   --   ALKPHOS 82 76  --   --   BILITOT 0.6 0.6  --   --   PROT 6.9 6.5  --   --   ALBUMIN 2.8* 2.6* 2.7* 2.5*    Recent Labs Lab 02/19/14 0243 02/20/14 0345 02/21/14 0500 02/21/14 0603  WBC 4.1 5.8 4.4 4.3  NEUTROABS 2.2  --   --   --   HGB 12.8 12.0 11.3* 11.3*  HCT 39.6 37.2 35.5* 35.3*  MCV 98.3 98.4 100.3* 100.6*  PLT 103* 107* 90* 80*   . aspirin EC  81 mg Oral Daily  . aspirin  325 mg Oral Once  . calcium acetate  2,001 mg Oral TID WC  . calcium  carbonate  1-2 tablet Oral Daily  . cinacalcet  30 mg Oral Q lunch  . gabapentin  300 mg Oral QHS  . gi cocktail  30 mL Oral Once  . insulin aspart  0-9 Units Subcutaneous TID WC  . levothyroxine  150 mcg Oral QAC breakfast  . metoprolol  50 mg Oral BID  . sodium chloride  3 mL Intravenous Q12H  . tacrolimus  3 mg Oral BID  . Warfarin - Pharmacist Dosing Inpatient   Does not apply q1800   . heparin 1,000 Units/hr (02/21/14 0930)   sodium chloride, sodium chloride, albuterol, alteplase, feeding supplement (NEPRO CARB STEADY), heparin, lidocaine (PF), lidocaine-prilocaine, LORazepam, pentafluoroprop-tetrafluoroeth, polyethylene glycol, promethazine, traMADol

## 2014-02-22 DIAGNOSIS — L659 Nonscarring hair loss, unspecified: Secondary | ICD-10-CM

## 2014-02-22 DIAGNOSIS — I509 Heart failure, unspecified: Secondary | ICD-10-CM

## 2014-02-22 DIAGNOSIS — M5412 Radiculopathy, cervical region: Secondary | ICD-10-CM

## 2014-02-22 LAB — CBC
HCT: 36.3 % (ref 36.0–46.0)
Hemoglobin: 11.7 g/dL — ABNORMAL LOW (ref 12.0–15.0)
MCH: 31.9 pg (ref 26.0–34.0)
MCHC: 32.2 g/dL (ref 30.0–36.0)
MCV: 98.9 fL (ref 78.0–100.0)
PLATELETS: 85 10*3/uL — AB (ref 150–400)
RBC: 3.67 MIL/uL — ABNORMAL LOW (ref 3.87–5.11)
RDW: 13.9 % (ref 11.5–15.5)
WBC: 4.2 10*3/uL (ref 4.0–10.5)

## 2014-02-22 LAB — PROTIME-INR
INR: 1.89 — ABNORMAL HIGH (ref 0.00–1.49)
Prothrombin Time: 21.9 seconds — ABNORMAL HIGH (ref 11.6–15.2)

## 2014-02-22 LAB — RENAL FUNCTION PANEL
ALBUMIN: 2.7 g/dL — AB (ref 3.5–5.2)
Anion gap: 8 (ref 5–15)
BUN: 27 mg/dL — ABNORMAL HIGH (ref 6–23)
CALCIUM: 7.7 mg/dL — AB (ref 8.4–10.5)
CO2: 30 mmol/L (ref 19–32)
Chloride: 96 mEq/L (ref 96–112)
Creatinine, Ser: 6.95 mg/dL — ABNORMAL HIGH (ref 0.50–1.10)
GFR calc non Af Amer: 6 mL/min — ABNORMAL LOW (ref >90)
GFR, EST AFRICAN AMERICAN: 7 mL/min — AB (ref >90)
Glucose, Bld: 106 mg/dL — ABNORMAL HIGH (ref 70–99)
PHOSPHORUS: 5.1 mg/dL — AB (ref 2.3–4.6)
POTASSIUM: 4.6 mmol/L (ref 3.5–5.1)
SODIUM: 134 mmol/L — AB (ref 135–145)

## 2014-02-22 LAB — GLUCOSE, CAPILLARY
GLUCOSE-CAPILLARY: 139 mg/dL — AB (ref 70–99)
Glucose-Capillary: 104 mg/dL — ABNORMAL HIGH (ref 70–99)
Glucose-Capillary: 140 mg/dL — ABNORMAL HIGH (ref 70–99)
Glucose-Capillary: 116 mg/dL — ABNORMAL HIGH (ref 70–99)

## 2014-02-22 LAB — HEPARIN LEVEL (UNFRACTIONATED): Heparin Unfractionated: 0.32 IU/mL (ref 0.30–0.70)

## 2014-02-22 MED ORDER — WARFARIN SODIUM 4 MG PO TABS
4.0000 mg | ORAL_TABLET | Freq: Once | ORAL | Status: AC
  Administered 2014-02-22: 4 mg via ORAL
  Filled 2014-02-22: qty 1

## 2014-02-22 MED ORDER — HYDROMORPHONE HCL 1 MG/ML IJ SOLN
0.5000 mg | Freq: Once | INTRAMUSCULAR | Status: AC
  Administered 2014-02-22: 0.5 mg via INTRAVENOUS

## 2014-02-22 MED ORDER — HYDROMORPHONE HCL 1 MG/ML IJ SOLN
INTRAMUSCULAR | Status: AC
  Administered 2014-02-22: 0.5 mg via INTRAVENOUS
  Filled 2014-02-22: qty 1

## 2014-02-22 NOTE — Progress Notes (Signed)
Patient Name: [name] Date of Encounter: 02/22/2014     Principal Problem:   Paroxysmal supraventricular tachycardia, s/p valve replacement, with chest pain. resolved as outpatient Active Problems:   DM2 (diabetes mellitus, type 2)   End stage renal disease on dialysis-TTS   Dyslipidemia-LDL 104, not on statin with Hx of liver transplant   Chest pain   S/P mechanical AVR Feb 2015   S/P CABG x 1, LIMA-LAD 03/2013   Nausea and vomiting   S/P liver transplant   Chronic hepatitis C without hepatic coma   Diastolic CHF, chronic   Thyroid activity decreased   Pain in the chest    SUBJECTIVE  No CP or SOB. FEeling well. Ready to go home.  CURRENT MEDS . aspirin EC  81 mg Oral Daily  . aspirin  325 mg Oral Once  . calcium acetate  2,001 mg Oral TID WC  . calcium carbonate  1-2 tablet Oral Daily  . cinacalcet  30 mg Oral Q lunch  . gabapentin  300 mg Oral QHS  . gi cocktail  30 mL Oral Once  . insulin aspart  0-9 Units Subcutaneous TID WC  . levothyroxine  150 mcg Oral QAC breakfast  . metoprolol  50 mg Oral BID  . sodium chloride  3 mL Intravenous Q12H  . tacrolimus  3 mg Oral BID  . warfarin  4 mg Oral ONCE-1800  . Warfarin - Pharmacist Dosing Inpatient   Does not apply q1800    OBJECTIVE  Filed Vitals:   02/21/14 1530 02/21/14 2051 02/22/14 0511 02/22/14 1001  BP: 140/39 146/39 134/36 164/48  Pulse: 66 63 61 62  Temp: 98.2 F (36.8 C) 98.3 F (36.8 C) 98.2 F (36.8 C)   TempSrc: Oral Oral Oral   Resp: 16 18 16    Height:      Weight:   170 lb 6.4 oz (77.293 kg)   SpO2: 100% 100% 94%     Intake/Output Summary (Last 24 hours) at 02/22/14 1043 Last data filed at 02/22/14 1004  Gross per 24 hour  Intake   1160 ml  Output   1930 ml  Net   -770 ml   Filed Weights   02/21/14 0628 02/21/14 1044 02/22/14 0511  Weight: 173 lb 1 oz (78.5 kg) 168 lb 14 oz (76.6 kg) 170 lb 6.4 oz (77.293 kg)    PHYSICAL EXAM  General: Vital signs reviewed. Patient is  well-developed and well-nourished, in no acute distress and cooperative with exam. HEENT: Alopecia patches on scalp. Neck is supple, no carotid bruit present.  Cardiovascular: RRR, S1 normal, S2 normal Pulmonary/Chest: Clear to auscultation bilaterally, no wheezes, rales, or rhonchi. Abdominal: Soft, non-tender, non-distended, BS +, large, well-healed surgical scar.  Extremities: AVF in place in left lower extremity on anterior superior thigh with good thrill and bruit. No lower extremity edema bilaterally Skin: Warm, dry and intact. Psychiatric: Normal mood and affect. speech and behavior is normal. Cognition and memory are normal.   Accessory Clinical Findings  CBC  Recent Labs  02/21/14 0603 02/22/14 0423  WBC 4.3 4.2  HGB 11.3* 11.7*  HCT 35.3* 36.3  MCV 100.6* 98.9  PLT 80* 85*   Basic Metabolic Panel  Recent Labs  02/21/14 0647 02/22/14 0423  NA 133* 134*  K 4.6 4.6  CL 96 96  CO2 26 30  GLUCOSE 137* 106*  BUN 64* 27*  CREATININE 11.05* 6.95*  CALCIUM 7.2* 7.7*  PHOS 6.5* 5.1*   Liver  Function Tests  Recent Labs  02/20/14 0345  02/21/14 0647 02/22/14 0423  AST 36  --   --   --   ALT 29  --   --   --   ALKPHOS 76  --   --   --   BILITOT 0.6  --   --   --   PROT 6.5  --   --   --   ALBUMIN 2.6*  < > 2.5* 2.7*  < > = values in this interval not displayed.  Cardiac Enzymes  Recent Labs  02/19/14 1200 02/19/14 1836  TROPONINI 0.04* 0.05*   Thyroid Function Tests  Recent Labs  02/19/14 1218  TSH 2.242    TELE  NSR HR 60s currently. No PSVT today .  Radiology/Studies  Dg Chest 2 View  02/19/2014   CLINICAL DATA:  Sharp chest pain with palpitations, headache, and shortness of breath. Currently on warfarin. Smoker. Dialysis patient.  EXAM: CHEST  2 VIEW  COMPARISON:  01/03/2014  FINDINGS: Postoperative changes in the mediastinum, lower lumbar spine, and upper abdomen. Cardiac valve prosthesis. The heart size and mediastinal contours are  within normal limits. Both lungs are clear. The visualized skeletal structures are unremarkable. Calcification of the aorta.  IMPRESSION: No active cardiopulmonary disease.   Electronically Signed   By: Lucienne Capers M.D.   On: 02/19/2014 02:58   2D ECHO Study Date: 02/20/2014 LV EF: 55% Study Conclusions - Left ventricle: The cavity size was mildly dilated. Wall   thickness was normal. The estimated ejection fraction was 55%.   Doppler parameters are consistent with elevated ventricular   end-diastolic filling pressure. - Aortic valve: Mechanical AVR Normal systolic gradinet Mild   perivalvular regurgitation and also mild closing volume more   central AR seen Valve area (VTI): 1.87 cm^2. Valve area (Vmax):   1.94 cm^2. Valve area (Vmean): 1.7 cm^2. - Mitral valve: There was mild regurgitation. - Left atrium: The atrium was mildly dilated. - Atrial septum: No defect or patent foramen ovale was identified  ASSESSMENT AND PLAN  [name] is a 59 yo female with PMHx of CAD s/p CABG, AS s/p mechanical AVR, ESRD on HD TTS, Hepatitis C s/p Liver transplant, T2DM, COPD, HTN, Hypothyroidism, and GERD who presented to [facility] 02/19/13 with complaint of chest pain and palpitations.   Chest pain-this is been an ongoing problem and she recently had cardiac catheterization in 12/2013. This showed patent graft with 70% mid LAD stenosis that was bypassed.  --Troponin mildly elevated at 0.04--> 0.04--> 0.04 --> 0.05. -- No subsequent CP  PSVT-she has a history of this after valve replacement surgery. -- She is currently converted back to sinus rhythm. No further episodes today -- Continue metoprolol 50mg  BID  Mechanical aVR- she has a [employer] mechanical aortic valve replacement. She was supposed to be on warfarin at home and reports that she has been taking it although her INR today was only 1.89.  -- Pharmacy consulted and bridging with heparin.  --  Repeat 2D echo w/  Mechanical AVR Normal  systolic gradinet. Mild perivalvular regurgitation and also mild closing volume more central AR seen Valve area (VTI): 1.87 cm^2. Valve area (Vmax): 1.94 cm^2. Valve area (Vmean): 1.7 cm^2.   Nausea/vomiting- now resolved.   INR almost therapeutic, no more CP or PSVT. AVR stable on ECHO. We may able to sign off today. MD to see.   Judy Pimple PA-C  Pager [phone]  I have personally seen and  examined patient and agree with note as outlined by Angelena Form PA.  She has not had any further PSVT on BB.  2D echo showed normally functioning AVR.  Her INR is still subtherapeutic at 1.89 on Coumadin and Heparin bridge.  Stop Heparin once INR 2.5.  Will sign off no new recs.  She will need her PT/INR checked a few days after discharge.  Signed: Fransico Him, MD [facility] 02/22/2014

## 2014-02-22 NOTE — Evaluation (Signed)
Physical Therapy Evaluation Patient Details Name: [name] MRN: [id number] DOB: 1956-01-23 Today's Date: 02/22/2014   History of Present Illness  59 yo female with PMHx of CAD s/p CABG, AS s/p mechanical AVR, ESRD on HD TTS, Hepatitis C s/p Liver transplant, T2DM, COPD, HTN, Hypothyroidism, and GERD who presented to [facility] 02/19/13 with complaint of chest pain and palpitations  Clinical Impression  Patient independent with all aspects of mobility, no physical assist required. No further acute PT needs, will sign off.    Follow Up Recommendations No PT follow up    Equipment Recommendations  None recommended by PT    Recommendations for Other Services       Precautions / Restrictions Restrictions Weight Bearing Restrictions: No      Mobility  Bed Mobility Overal bed mobility: Independent                Transfers Overall transfer level: Independent                  Ambulation/Gait Ambulation/Gait assistance: Independent Ambulation Distance (Feet): 320 Feet Assistive device: None (pushing IV pole, and without device) Gait Pattern/deviations: Step-through pattern;Decreased stride length;Narrow base of support   Gait velocity interpretation: at or above normal speed for age/gender General Gait Details: stead with ambulation  Stairs Stairs: Yes Stairs assistance: Modified independent (Device/Increase time) Stair Management: One rail Right;Forwards;Step to pattern Number of Stairs: 5 General stair comments: performed x2 no physical assist requried  Wheelchair Mobility    Modified Rankin (Stroke Patients Only)       Balance Overall balance assessment: Modified Independent                           High level balance activites: Side stepping;Backward walking;Direction changes;Turns;Sudden stops;Head turns High Level Balance Comments: no noted LOB or signficant deficits              Pertinent Vitals/Pain Pain Assessment: No/denies  pain    Home Living Family/patient expects to be discharged to:: Private residence Living Arrangements: Children Available Help at Discharge: Available PRN/intermittently Type of Home: Mobile home Home Access: Stairs to enter Entrance Stairs-Rails: Right;Left;Can reach both Entrance Stairs-Number of Steps: 5 Home Layout: One level Home Equipment: Walker - 2 wheels;Cane - single point Additional Comments: daughter comes by twice per week    Prior Function                 Hand Dominance   Dominant Hand: Right    Extremity/Trunk Assessment   Upper Extremity Assessment: Overall WFL for tasks assessed           Lower Extremity Assessment: Overall WFL for tasks assessed         Communication   Communication: No difficulties  Cognition Arousal/Alertness: Awake/alert Behavior During Therapy: WFL for tasks assessed/performed Overall Cognitive Status: Within Functional Limits for tasks assessed                      General Comments      Exercises        Assessment/Plan    PT Assessment Patent does not need any further PT services  PT Diagnosis Difficulty walking   PT Problem List    PT Treatment Interventions     PT Goals (Current goals can be found in the Care Plan section) Acute Rehab PT Goals Patient Stated Goal: to go home PT Goal Formulation: With patient Time For Goal Achievement:  03/01/14 Potential to Achieve Goals: Good    Frequency     Barriers to discharge        Co-evaluation               End of Session Equipment Utilized During Treatment: Gait belt Activity Tolerance: Patient tolerated treatment well Patient left: in chair;with call bell/phone within reach Nurse Communication: Mobility status         Time: 1212-1226 PT Time Calculation (min) (ACUTE ONLY): 14 min   Charges:   PT Evaluation $Initial PT Evaluation Tier I: 1 Procedure PT Treatments $Self Care/Home Management: 8-22   PT G CodesDuncan Dull 02/22/2014, 1:40 PM Alben Deeds, [place] DPT  [phone]

## 2014-02-22 NOTE — Patient Instructions (Signed)
Heel Slides   Squeeze pelvic floor and hold. Slide left heel along bed towards bottom. Hold for ___ seconds. Slide back to flat knee position. Repeat ___ times. Do ___ times a day. Repeat with other leg.    Copyright  VHI. All rights reserved.  Arm Press: on Wall   Con las rodillas dobladas y la columna presionando contra el rodillo plano arriba apoye los antebrazos y Psychologist, sport and exercise contra la pelota, con las palmas hacia adelante y deslize el brazo [place]. Luego presione con los brazos [employer]. Mantenga las rodillas dobladas. Mantenga ___ segundos. Repita ___ veces. Brazo izquierdo Investment banker, operational. Haga ___ sesiones diarias.  Copyright  VHI. All rights reserved.

## 2014-02-22 NOTE — Progress Notes (Signed)
[place] for warfarin and heparin Indication: Mechanical heart valve - AVR  Allergies  Allergen Reactions  . Acetaminophen Other (See Comments)    Liver transplant recipient   . Codeine Itching  . Mirtazapine Other (See Comments)    hallucination  . Morphine Itching    Patient Measurements: Height: 5\' 4"  (162.6 cm) Weight: 170 lb 6.4 oz (77.293 kg) IBW/kg (Calculated) : 54.7 Heparin Dosing Weight: 70 kg  Vital Signs: Temp: 98.2 F (36.8 C) (01/13 0511) Temp Source: Oral (01/13 0511) BP: 134/36 mmHg (01/13 0511) Pulse Rate: 61 (01/13 0511)  Labs:  Recent Labs  02/19/14 1200  02/19/14 1836  02/20/14 0345 02/20/14 1618 02/21/14 0500 02/21/14 0603 02/21/14 0647 02/22/14 0423  HGB  --   --   --   < > 12.0  --  11.3* 11.3*  --  11.7*  HCT  --   --   --   < > 37.2  --  35.5* 35.3*  --  36.3  PLT  --   --   --   < > 107*  --  90* 80*  --  85*  LABPROT  --   --   --   --  15.3*  --  19.3*  --   --  21.9*  INR  --   --   --   --  1.19  --  1.62*  --   --  1.89*  HEPARINUNFRC  --   < > 0.43  --  0.34  --  0.33  --   --  0.32  CREATININE  --   --   --   < > 8.86* 9.95*  --   --  11.05* 6.95*  TROPONINI 0.04*  --  0.05*  --   --   --   --   --   --   --   < > = values in this interval not displayed.  Estimated Creatinine Clearance: 8.8 mL/min (by C-G formula based on Cr of 6.95).   Medications:  See home med list.   Home warfarin dose is 3mg  daily  Assessment: 59 year old woman in warfarin prior to admission for a mechanical AVR. INR on admission is subtherapeutic and remains subtherapeutic today although trending up.  Heparin is therapeutic 0.32 units/ml  Goal of Therapy:  INR 2.5-3 HL 0.3-0.7 Monitor platelets by anticoagulation protocol: Yes   Plan:  Continue heparin at 1000 units / hr Coumadin 4 mg po today Follow up AM labs  Thanks for allowing pharmacy to be a part of this patient's care.  Excell Seltzer,  PharmD Clinical Pharmacist, [phone] 02/22/2014,9:56 AM

## 2014-02-22 NOTE — Care Management Note (Unsigned)
    Page 1 of 1   02/22/2014     5:09:21 PM CARE MANAGEMENT NOTE 02/22/2014  Patient:  [name], [name]   Account Number:  [id number]  Date Initiated:  02/22/2014  Documentation initiated by:  Antoni Stefan  Subjective/Objective Assessment:   Pt adm on 02/19/14 with CP, PSVT. PTA, pt resides at home with daughter.     Action/Plan:   Will follow for dc needs as pt progresses.   Anticipated DC Date:  02/23/2014   Anticipated DC Plan:  HOME/SELF CARE         Choice offered to / List presented to:             Status of service:  In process, will continue to follow Medicare Important Message given?  YES (If response is "NO", the following Medicare IM given date fields will be blank) Date Medicare IM given:  02/22/2014 Medicare IM given by:  Rilei Kravitz Date Additional Medicare IM given:   Additional Medicare IM given by:    Discharge Disposition:    Per UR Regulation:  Reviewed for med. necessity/level of care/duration of stay  If discussed at [place] of Stay Meetings, dates discussed:    Comments:

## 2014-02-22 NOTE — Progress Notes (Signed)
Patient ID: [name], female   DOB: [birth year], 59 y.o.   MRN: [id number] Medicine attending: We appreciate cardiology input. I examined this patient today together with resident physician Dr. Osa Craver and I concur with her evaluation and management plan. She remains free of recurrent arrhythmias. Exam is stable. She continues on parenteral heparin while Coumadin is adjusted. She has end-stage renal disease which obligates US to use unfractionated heparin. She has a mechanical aortic valve and we need to assure adequate anticoagulation prior to hospital discharge.

## 2014-02-22 NOTE — Progress Notes (Addendum)
Subjective:  Patient was seen and examined this morning. Patient denies any recurrent rapid heart rate or palpitations, no chest pain or shortness of breath. Patient does complain of numbness and tingling in right upper extremity from shoulder to hand. This is chronic nature.   Objective: Vital signs in last 24 hours: Filed Vitals:   02/21/14 1530 02/21/14 2051 02/22/14 0511 02/22/14 1001  BP: 140/39 146/39 134/36 164/48  Pulse: 66 63 61 62  Temp: 98.2 F (36.8 C) 98.3 F (36.8 C) 98.2 F (36.8 C)   TempSrc: Oral Oral Oral   Resp: 16 18 16    Height:      Weight:   77.293 kg (170 lb 6.4 oz)   SpO2: 100% 100% 94%    Weight change: -1.9 kg (-4 lb 3 oz)  Intake/Output Summary (Last 24 hours) at 02/22/14 1130 Last data filed at 02/22/14 1004  Gross per 24 hour  Intake   1160 ml  Output      0 ml  Net   1160 ml   General: Vital signs reviewed. Patient is well-developed and well-nourished, in no acute distress and cooperative with exam.  HEENT: Alopecia patches on scalp. Neck is supple, no carotid bruit present.  Cardiovascular: RRR, S1 normal, S2 normal Pulmonary/Chest: Clear to auscultation bilaterally, no wheezes, rales, or rhonchi. Abdominal: Soft, non-tender, non-distended, BS +, large, well-healed surgical scar.  Extremities: Right upper extremity has 5/5 strength, but decreased sensation as compared to left. Normal pulses. AVF in place in left lower extremity. No lower extremity edema bilaterally Skin: Warm, dry and intact. Psychiatric: Normal mood and affect. Speech and behavior is normal. Cognition and memory are normal.   Lab Results: Basic Metabolic Panel:  Recent Labs Lab 02/19/14 0620  02/21/14 0647 02/22/14 0423  NA  --   < > 133* 134*  K  --   < > 4.6 4.6  CL  --   < > 96 96  CO2  --   < > 26 30  GLUCOSE  --   < > 137* 106*  BUN  --   < > 64* 27*  CREATININE  --   < > 11.05* 6.95*  CALCIUM  --   < > 7.2* 7.7*  MG 2.2  --   --   --   PHOS  --   < >  6.5* 5.1*  < > = values in this interval not displayed. Liver Function Tests:  Recent Labs Lab 02/19/14 0243 02/20/14 0345  02/21/14 0647 02/22/14 0423  AST 52* 36  --   --   --   ALT 33 29  --   --   --   ALKPHOS 82 76  --   --   --   BILITOT 0.6 0.6  --   --   --   PROT 6.9 6.5  --   --   --   ALBUMIN 2.8* 2.6*  < > 2.5* 2.7*  < > = values in this interval not displayed. CBC:  Recent Labs Lab 02/19/14 0243  02/21/14 0603 02/22/14 0423  WBC 4.1  < > 4.3 4.2  NEUTROABS 2.2  --   --   --   HGB 12.8  < > 11.3* 11.7*  HCT 39.6  < > 35.3* 36.3  MCV 98.3  < > 100.6* 98.9  PLT 103*  < > 80* 85*  < > = values in this interval not displayed. Cardiac Enzymes:  Recent Labs Lab 02/19/14 [phone]  02/19/14 1200 02/19/14 1836  TROPONINI 0.04* 0.04* 0.05*   CBG:  Recent Labs Lab 02/20/14 2159 02/21/14 0639 02/21/14 1118 02/21/14 1708 02/21/14 2114 02/22/14 0632  GLUCAP 205* 134* 120* 183* 142* 104*   Thyroid Function Tests:  Recent Labs Lab 02/19/14 1218  TSH 2.242   Coagulation:  Recent Labs Lab 02/19/14 0700 02/20/14 0345 02/21/14 0500 02/22/14 0423  LABPROT 15.6* 15.3* 19.3* 21.9*  INR 1.23 1.19 1.62* 1.89*   Studies/Results: No results found. Medications:  I have reviewed the patient's current medications. Prior to Admission:  Prescriptions prior to admission  Medication Sig Dispense Refill Last Dose  . albuterol (PROVENTIL,VENTOLIN) 90 MCG/ACT inhaler Inhale 1-2 puffs into the lungs every 6 (six) hours as needed for wheezing or shortness of breath.    Past Month at Unknown time  . aspirin EC 81 MG tablet Take 81 mg by mouth daily.     02/18/2014 at Unknown time  . calcium acetate (PHOSLO) 667 MG capsule Take 2,001 mg by mouth 3 (three) times daily with meals.   02/18/2014 at Unknown time  . calcium carbonate (TUMS - DOSED IN MG ELEMENTAL CALCIUM) 500 MG chewable tablet Chew 1-2 tablets by mouth daily.    02/18/2014 at Unknown time  . cinacalcet (SENSIPAR)  30 MG tablet Take 30 mg by mouth daily.   02/18/2014 at Unknown time  . gabapentin (NEURONTIN) 300 MG capsule Take 300 mg by mouth at bedtime.    02/18/2014 at Unknown time  . insulin detemir (LEVEMIR) 100 UNIT/ML injection Inject 2-5 Units into the skin daily as needed (blood). 150-200 do 2 units >200 do 5 units   last month  . levothyroxine (SYNTHROID, LEVOTHROID) 150 MCG tablet Take 150 mcg by mouth daily before breakfast.   02/18/2014 at Unknown time  . LORazepam (ATIVAN) 0.5 MG tablet Take 0.5 mg by mouth at bedtime as needed for sleep.    Past Month at Unknown time  . metoprolol (LOPRESSOR) 50 MG tablet On non dialysis days take 1 & 1/2 tablet in the morning and 1 tablet in the evening. On dialysis days take 1 tablet twice daily 180 tablet 3 02/18/2014 at 2300  . ondansetron (ZOFRAN-ODT) 4 MG disintegrating tablet Take 4 mg by mouth every 8 (eight) hours as needed for nausea or vomiting.   unknown  . polyethylene glycol (MIRALAX / GLYCOLAX) packet Take 17 g by mouth daily as needed for mild constipation.    Past Month at Unknown time  . promethazine (PHENERGAN) 12.5 MG tablet Take 12.5 mg by mouth every 4 (four) hours as needed for nausea.    unknown  . tacrolimus (PROGRAF) 1 MG capsule Take 3 mg by mouth 2 (two) times daily.    02/18/2014 at Unknown time  . traMADol (ULTRAM) 50 MG tablet Take 50 mg by mouth every 12 (twelve) hours as needed for moderate pain.    unknown  . warfarin (COUMADIN) 3 MG tablet Take 3 mg by mouth daily.   02/18/2014 at Unknown time  . Multiple Vitamin (MULTIVITAMIN WITH MINERALS) TABS tablet Take 1 tablet by mouth at bedtime.    Past Week at Unknown time  . RENVELA 2.4 G PACK Take 2.4 g by mouth 3 (three) times daily with meals.    01/02/2014 at Unknown time  . VELPHORO 500 MG chewable tablet Chew 1 tablet by mouth 3 (three) times daily with meals.    01/02/2014 at Unknown time   Scheduled Meds: . aspirin EC  81 mg Oral  Daily  . aspirin  325 mg Oral Once  . calcium acetate   2,001 mg Oral TID WC  . calcium carbonate  1-2 tablet Oral Daily  . cinacalcet  30 mg Oral Q lunch  . gabapentin  300 mg Oral QHS  . gi cocktail  30 mL Oral Once  . insulin aspart  0-9 Units Subcutaneous TID WC  . levothyroxine  150 mcg Oral QAC breakfast  . metoprolol  50 mg Oral BID  . sodium chloride  3 mL Intravenous Q12H  . tacrolimus  3 mg Oral BID  . warfarin  4 mg Oral ONCE-1800  . Warfarin - Pharmacist Dosing Inpatient   Does not apply q1800   Continuous Infusions: . heparin 1,000 Units/hr (02/21/14 1315)   PRN Meds:.albuterol, LORazepam, polyethylene glycol, promethazine, traMADol Assessment/Plan: Principal Problem:   Paroxysmal supraventricular tachycardia, s/p valve replacement, with chest pain. 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 Cardiology has signed off.  -ASA 81 mg dailyl -Metoprolol 50 mg BID -Discontinued cardiac monitoring since patient had no recorded repeat episodes since admission.  -Cardiac diet  CAD s/p CABG and Aortic Valve Replacement:  Patient was subtherapeutic on admission. Patient is on heparin gtt with bridge to coumadin. INR 1.89 this morning, goal 2.5. Once therapeutic, can be discharged to home. Patient will need follow up with INR checked a few days after discharge.  -ASA 81 mg daily -Metoprolol 50 mg BID -Heparin gtt bridging to coumadin  Chronic Grade 2 Diastolic Dysfunction: Echo on 02/20/14 showed an EF of 55% and mild aortic regurgitation. Patient is on ASA 81 mg daily and metoprolol at home.  -ASA  81 mg daily -Metoprolol 50 mg BID -Daily weights -I/Os  ESRD on HD TTS: Patient is on HD TTS. She is on Phoslo TID, Sensipar, Velphoro and Renvela at home. HD completed yesterday. -Phoslo TID -Calcium Carbonate 200-400 mg 1-2 tabs daily -Sensipar 30 mg daily -Repeat renal function panel tomorrow am  Cervical Radiculopathy: Patient complains of chronic tingling, numbness and pain in her right upper extremity from her neck to her hand. Patient has normal strength and pulses. Patient states she has had this for a long time and it previously improved when she was active and working. She denies any trauma. I can find any history of this in her chart nor has she had any imagining of her spine. I would favor conservative management for now and establishing care with a primary care physician as she does not have one. Patient can be treated with analgesics, physical therapy and short course of steroids could be considered in the future if no improvement. Patient is allergic to morphine, codeine and cannot have acetaminophen secondary to liver transplant. Patient also has ESRD.  -PT  T2DM: HgbA1c 6.3 on 12/15/13. Patient is on levemir 2-5 units at home. Glucose has been well controlled.  -Gabapentin 300 mg QHS -CBGs AC/HS -SSI-S  HTN: BP 120s-120s/40-60s. Patient is on metoprolol at home.  -Continue Metoprolol 50 mg BID  DVT/PE ppx: Heparin gtt, Coumadin  Dispo: Disposition is deferred at this time, awaiting improvement of current medical problems.  Anticipated discharge in approximately 1-2 day(s).   The patient does have a current PCP (No primary care provider on file.) and does not need an [facility] hospital follow-up appointment after discharge.  The patient does not have transportation limitations that hinder transportation to clinic appointments.  .Services Needed at time of discharge: Y = Yes, Blank = No PT:   OT:   RN:   Equipment:   Other:     LOS: 3 days   Osa Craver, DO PGY-1  Internal Medicine Resident Pager # [phone] 02/22/2014 11:30 AM

## 2014-02-22 NOTE — Progress Notes (Signed)
[facility] Progress Note   Subjective: No complaints  Filed Vitals:   02/21/14 1530 02/21/14 2051 02/22/14 0511 02/22/14 1001  BP: 140/39 146/39 134/36 164/48  Pulse: 66 63 61 62  Temp: 98.2 F (36.8 C) 98.3 F (36.8 C) 98.2 F (36.8 C)   TempSrc: Oral Oral Oral   Resp: 16 18 16    Height:      Weight:   77.293 kg (170 lb 6.4 oz)   SpO2: 100% 100% 94%     Exam Alert, no distress No jvd Chest clear bilat RRR soft SEM, metallic valve click, no RG Sternotomy scar w keloid Abd soft, NTND, no mass or ascites No LE edema L thigh AVG patent, no sign of infection Neuro is nf, Ox 3  HD: TTS [name] 3h 62min   400/A1.5   76.5kg   2/2.0 Bath   Heparin none Calcitriol 0.5 ug TIW   Assessment: 1. SVT - converted to NSR in ED 2. Chest pain - had neg cath Nov '15, chronic issue, on medical Rx 3. S/P CABGx1/AVR in Feb 2015 - on IV hep, coumadin adjustment d/t low INR 4. ESRD on HD 5. HTN / vol - no vol excess, on MTP as at home 6. Anemia Hb 11 , no esa 7. Liver transplant - on IS medication 8. DM 2 on levimir  Plan- HD tomorrow    [name] MD  pager [phone]    cell [phone]  02/22/2014, 1:29 PM     Recent Labs Lab 02/20/14 1618 02/21/14 0647 02/22/14 0423  NA 136 133* 134*  K 5.0 4.6 4.6  CL 98 96 96  CO2 29 26 30   GLUCOSE 105* 137* 106*  BUN 54* 64* 27*  CREATININE 9.95* 11.05* 6.95*  CALCIUM 7.8* 7.2* 7.7*  PHOS 7.1* 6.5* 5.1*    Recent Labs Lab 02/19/14 0243 02/20/14 0345 02/20/14 1618 02/21/14 0647 02/22/14 0423  AST 52* 36  --   --   --   ALT 33 29  --   --   --   ALKPHOS 82 76  --   --   --   BILITOT 0.6 0.6  --   --   --   PROT 6.9 6.5  --   --   --   ALBUMIN 2.8* 2.6* 2.7* 2.5* 2.7*    Recent Labs Lab 02/19/14 0243  02/21/14 0500 02/21/14 0603 02/22/14 0423  WBC 4.1  < > 4.4 4.3 4.2  NEUTROABS 2.2  --   --   --   --   HGB 12.8  < > 11.3* 11.3* 11.7*  HCT 39.6  < > 35.5* 35.3* 36.3  MCV 98.3  < > 100.3*  100.6* 98.9  PLT 103*  < > 90* 80* 85*  < > = values in this interval not displayed. [name] aspirin EC  81 mg Oral Daily  . aspirin  325 mg Oral Once  . calcium acetate  2,001 mg Oral TID WC  . calcium carbonate  1-2 tablet Oral Daily  . cinacalcet  30 mg Oral Q lunch  . gabapentin  300 mg Oral QHS  . gi cocktail  30 mL Oral Once  . insulin aspart  0-9 Units Subcutaneous TID WC  . levothyroxine  150 mcg Oral QAC breakfast  . metoprolol  50 mg Oral BID  . sodium chloride  3 mL Intravenous Q12H  . tacrolimus  3 mg Oral BID  . warfarin  4 mg Oral ONCE-1800  .  Warfarin - Pharmacist Dosing Inpatient   Does not apply q1800   . heparin 1,000 Units/hr (02/22/14 1317)   albuterol, LORazepam, polyethylene glycol, promethazine, traMADol

## 2014-02-23 DIAGNOSIS — E875 Hyperkalemia: Secondary | ICD-10-CM

## 2014-02-23 LAB — BASIC METABOLIC PANEL
ANION GAP: 17 — AB (ref 5–15)
BUN: 42 mg/dL — ABNORMAL HIGH (ref 6–23)
CALCIUM: 8.2 mg/dL — AB (ref 8.4–10.5)
CO2: 26 mmol/L (ref 19–32)
Chloride: 94 mEq/L — ABNORMAL LOW (ref 96–112)
Creatinine, Ser: 9 mg/dL — ABNORMAL HIGH (ref 0.50–1.10)
GFR calc non Af Amer: 4 mL/min — ABNORMAL LOW (ref >90)
GFR, EST AFRICAN AMERICAN: 5 mL/min — AB (ref >90)
Glucose, Bld: 106 mg/dL — ABNORMAL HIGH (ref 70–99)
Potassium: 6.1 mmol/L (ref 3.5–5.1)
Sodium: 137 mmol/L (ref 135–145)

## 2014-02-23 LAB — HEPARIN LEVEL (UNFRACTIONATED)
HEPARIN UNFRACTIONATED: 0.39 [IU]/mL (ref 0.30–0.70)
Heparin Unfractionated: 0.19 IU/mL — ABNORMAL LOW (ref 0.30–0.70)

## 2014-02-23 LAB — GLUCOSE, CAPILLARY
Glucose-Capillary: 99 mg/dL (ref 70–99)
Glucose-Capillary: 139 mg/dL — ABNORMAL HIGH (ref 70–99)
Glucose-Capillary: 101 mg/dL — ABNORMAL HIGH (ref 70–99)
Glucose-Capillary: 127 mg/dL — ABNORMAL HIGH (ref 70–99)

## 2014-02-23 LAB — CBC
HCT: 37.3 % (ref 36.0–46.0)
Hemoglobin: 12.4 g/dL (ref 12.0–15.0)
MCH: 32.3 pg (ref 26.0–34.0)
MCHC: 33.2 g/dL (ref 30.0–36.0)
MCV: 97.1 fL (ref 78.0–100.0)
Platelets: 97 10*3/uL — ABNORMAL LOW (ref 150–400)
RBC: 3.84 MIL/uL — ABNORMAL LOW (ref 3.87–5.11)
RDW: 13.7 % (ref 11.5–15.5)
WBC: 5.1 10*3/uL (ref 4.0–10.5)

## 2014-02-23 LAB — PROTIME-INR
INR: 2.29 — ABNORMAL HIGH (ref 0.00–1.49)
PROTHROMBIN TIME: 25.4 s — AB (ref 11.6–15.2)

## 2014-02-23 MED ORDER — SODIUM CHLORIDE 0.9 % IV SOLN: 100.0000 mL | INTRAVENOUS | Status: DC | PRN

## 2014-02-23 MED ORDER — WARFARIN SODIUM 4 MG PO TABS
4.0000 mg | ORAL_TABLET | Freq: Once | ORAL | Status: AC
  Administered 2014-02-23: 4 mg via ORAL
  Filled 2014-02-23: qty 1

## 2014-02-23 MED ORDER — HEPARIN SODIUM (PORCINE) 1000 UNIT/ML DIALYSIS: 1000.0000 [IU] | INTRAMUSCULAR | Status: DC | PRN

## 2014-02-23 MED ORDER — LIDOCAINE HCL (PF) 1 % IJ SOLN: 5.0000 mL | INTRAMUSCULAR | Status: DC | PRN

## 2014-02-23 MED ORDER — PENTAFLUOROPROP-TETRAFLUOROETH EX AERO: 1.0000 "application " | INHALATION_SPRAY | CUTANEOUS | Status: DC | PRN

## 2014-02-23 MED ORDER — ALTEPLASE 2 MG IJ SOLR
2.0000 mg | Freq: Once | INTRAMUSCULAR | Status: DC | PRN
  Filled 2014-02-23: qty 2

## 2014-02-23 MED ORDER — LIDOCAINE-PRILOCAINE 2.5-2.5 % EX CREA: 1.0000 "application " | TOPICAL_CREAM | CUTANEOUS | Status: DC | PRN

## 2014-02-23 MED ORDER — NEPRO/CARBSTEADY PO LIQD: 237.0000 mL | ORAL | Status: DC | PRN

## 2014-02-23 NOTE — Progress Notes (Signed)
Subjective:   No complaints, feeling well. Wants to go home  Objective Filed Vitals:   02/23/14 0730 02/23/14 0800 02/23/14 0826 02/23/14 0856  BP: 185/77 167/70 155/68 159/71  Pulse: 64 72 63 66  Temp:      TempSrc:      Resp:      Height:      Weight:      SpO2:       Physical Exam General: alert and oriented. No acute distress.  Heart: RRR Lungs: CTA, unlabored Abdomen: soft, nontender +BS  Extremities: no edema  Dialysis Access: L thigh AVG patent on HD  HD: TTS [name] 3h 108min 400/A1.5 76.5kg 2/2.0 Bath Heparin none Calcitriol 0.5 ug TIW  Assessment/Plan: 1. SVT - converted to NSR in ED 2. Chest pain - had neg cath Nov '15, chronic issue, on medical Rx. ECHO 02/20/14 EF 55% 3. S/P CABGx1/AVR in Feb 2015 - on IV hep, coumadin adjustment d/t low INR2.29 today.  4. ESRD on HD K+6.1 pre HD today 5. HTN / vol -159/71 no vol excess, on MTP as at home. uf goal 2800 today 6. Anemia Hb 12.4 , no esa- watch CBC 7. Liver transplant - on IS medication 8. DM 2 on levimir- per primary 9. dispo- DC when INR 2.5  Shelle Iron, NP [place] [phone] 02/23/2014,9:19 AM  LOS: 4 days   Pt seen, examined and agree w A/P as above.  [name] MD pager [phone]    cell [phone] 02/23/2014, 11:38 AM    Additional Objective Labs: Basic Metabolic Panel:  Recent Labs Lab 02/20/14 1618 02/21/14 0647 02/22/14 0423 02/23/14 0535  NA 136 133* 134* 137  K 5.0 4.6 4.6 6.1*  CL 98 96 96 94*  CO2 29 26 30 26   GLUCOSE 105* 137* 106* 106*  BUN 54* 64* 27* 42*  CREATININE 9.95* 11.05* 6.95* 9.00*  CALCIUM 7.8* 7.2* 7.7* 8.2*  PHOS 7.1* 6.5* 5.1*  --    Liver Function Tests:  Recent Labs Lab 02/19/14 0243 02/20/14 0345 02/20/14 1618 02/21/14 0647 02/22/14 0423  AST 52* 36  --   --   --   ALT 33 29  --   --   --   ALKPHOS 82 76  --   --   --   BILITOT 0.6 0.6  --   --   --   PROT 6.9 6.5  --   --   --   ALBUMIN 2.8* 2.6* 2.7* 2.5*  2.7*   No results for input(s): LIPASE, AMYLASE in the last 168 hours. CBC:  Recent Labs Lab 02/19/14 0243 02/20/14 0345 02/21/14 0500 02/21/14 0603 02/22/14 0423 02/23/14 0535  WBC 4.1 5.8 4.4 4.3 4.2 5.1  NEUTROABS 2.2  --   --   --   --   --   HGB 12.8 12.0 11.3* 11.3* 11.7* 12.4  HCT 39.6 37.2 35.5* 35.3* 36.3 37.3  MCV 98.3 98.4 100.3* 100.6* 98.9 97.1  PLT 103* 107* 90* 80* 85* 97*   Blood Culture    Component Value Date/Time   SDES BLOOD RIGHT HAND 07/04/2013 0024   SPECREQUEST BOTTLES DRAWN AEROBIC ONLY 1CC 07/04/2013 0024   CULT  07/04/2013 0024    NO GROWTH 5 DAYS Performed at [place] 07/10/2013 FINAL 07/04/2013 0024    Cardiac Enzymes:  Recent Labs Lab 02/19/14 0243 02/19/14 0537 02/19/14 1200 02/19/14 1836  TROPONINI 0.04* 0.04* 0.04* 0.05*   CBG:  Recent  Labs Lab 02/22/14 0632 02/22/14 1150 02/22/14 1619 02/22/14 2133 02/23/14 0629  GLUCAP 104* 140* 116* 139* 99   Iron Studies: No results for input(s): IRON, TIBC, TRANSFERRIN, FERRITIN in the last 72 hours. @lablastinr3 @ Studies/Results: No results found. Medications: . heparin 1,000 Units/hr (02/22/14 1317)   . aspirin EC  81 mg Oral Daily  . aspirin  325 mg Oral Once  . calcium acetate  2,001 mg Oral TID WC  . calcium carbonate  1-2 tablet Oral Daily  . cinacalcet  30 mg Oral Q lunch  . gabapentin  300 mg Oral QHS  . gi cocktail  30 mL Oral Once  . insulin aspart  0-9 Units Subcutaneous TID WC  . levothyroxine  150 mcg Oral QAC breakfast  . metoprolol  50 mg Oral BID  . sodium chloride  3 mL Intravenous Q12H  . tacrolimus  3 mg Oral BID  . Warfarin - Pharmacist Dosing Inpatient   Does not apply [phone]

## 2014-02-23 NOTE — Progress Notes (Signed)
[place] for warfarin and heparin Indication: Mechanical heart valve - AVR  Allergies  Allergen Reactions  . Acetaminophen Other (See Comments)    Liver transplant recipient   . Codeine Itching  . Mirtazapine Other (See Comments)    hallucination  . Morphine Itching    Patient Measurements: Height: 5\' 4"  (162.6 cm) Weight: 173 lb 8 oz (78.7 kg) IBW/kg (Calculated) : 54.7 Heparin Dosing Weight: 70 kg  Vital Signs: Temp: 97.7 F (36.5 C) (01/14 0657) Temp Source: Oral (01/14 0657) BP: 126/58 mmHg (01/14 1000) Pulse Rate: 66 (01/14 1000)  Labs:  Recent Labs  02/21/14 0500 02/21/14 0603 02/21/14 0647 02/22/14 0423 02/23/14 0535  HGB 11.3* 11.3*  --  11.7* 12.4  HCT 35.5* 35.3*  --  36.3 37.3  PLT 90* 80*  --  85* 97*  LABPROT 19.3*  --   --  21.9* 25.4*  INR 1.62*  --   --  1.89* 2.29*  HEPARINUNFRC 0.33  --   --  0.32 0.19*  CREATININE  --   --  11.05* 6.95* 9.00*    Estimated Creatinine Clearance: 6.8 mL/min (by C-G formula based on Cr of 9).   Medications:  See home med list.   Home warfarin dose is 3mg  daily  Assessment: 59 year old woman in warfarin prior to admission for a mechanical AVR. INR on admission is subtherapeutic and remains subtherapeutic today although trending up.  Heparin is subtherapeutic although has been stable on this dose past few days.    Goal of Therapy:  INR 2.5-3 HL 0.3-0.7 Monitor platelets by anticoagulation protocol: Yes   Plan:  Increase heparin to 1100 units / hr Check heparin level 8 hours after increase Coumadin 4 mg po today Follow up AM labs  Thanks for allowing pharmacy to be a part of this patient's care.  Excell Seltzer, PharmD Clinical Pharmacist, [phone] 02/23/2014,10:15 AM

## 2014-02-23 NOTE — Progress Notes (Signed)
ANTICOAGULATION CONSULT NOTE  Pharmacy Consult for heparin Indication: Mechanical heart valve - AVR  Allergies  Allergen Reactions  . Acetaminophen Other (See Comments)    Liver transplant recipient   . Codeine Itching  . Mirtazapine Other (See Comments)    hallucination  . Morphine Itching    Patient Measurements: Height: 5\' 4"  (162.6 cm) Weight: 171 lb 8.3 oz (77.8 kg) IBW/kg (Calculated) : 54.7 Heparin Dosing Weight: 70 kg  Vital Signs: Temp: 97.6 F (36.4 C) (01/14 1316) Temp Source: Oral (01/14 1316) BP: 114/43 mmHg (01/14 1316) Pulse Rate: 66 (01/14 1316)  Labs:  Recent Labs  02/21/14 0500 02/21/14 0603 02/21/14 0647 02/22/14 0423 02/23/14 0535 02/23/14 1927  HGB 11.3* 11.3*  --  11.7* 12.4  --   HCT 35.5* 35.3*  --  36.3 37.3  --   PLT 90* 80*  --  85* 97*  --   LABPROT 19.3*  --   --  21.9* 25.4*  --   INR 1.62*  --   --  1.89* 2.29*  --   HEPARINUNFRC 0.33  --   --  0.32 0.19* 0.39  CREATININE  --   --  11.05* 6.95* 9.00*  --     Estimated Creatinine Clearance: 6.8 mL/min (by C-G formula based on Cr of 9).  Assessment: 59 year old woman on warfarin prior to admission for a mechanical AVR. Heparin is therapeutic this PM.   Goal of Therapy:  HL 0.3-0.7 Monitor platelets by anticoagulation protocol: Yes   Plan:  Continue heparin to 1100 units / hr Follow up AM labs  Thanks for allowing pharmacy to be a part of this patient's care.  Sloan Leiter, PharmD, BCPS Clinical Pharmacist [phone] 02/23/2014,8:06 PM

## 2014-02-23 NOTE — Progress Notes (Signed)
Medicine attending: I personally interviewed and examined this patient and reviewed pertinent medical, laboratory, and radiographic data and I concur with the evaluation and management plan as recorded by medical resident Dr. Osa Craver. No arrhythmias since admission. INR now therapeutic. If I remains therapeutic tomorrow heparin will be discontinued and the patient can be discharged.

## 2014-02-23 NOTE — Progress Notes (Signed)
Hemodialysis= MD paged d/t high K=6.1. Order to place on 1K bath for 1 hour per Dr. Jonnie Finner. Done. Continue to monitor patient.

## 2014-02-23 NOTE — Progress Notes (Signed)
Subjective:  Patient was seen and examined this morning. Patient feels well, denies any complaints of tachycardia, palpitations or chest pain.   Objective: Vital signs in last 24 hours: Filed Vitals:   02/23/14 1030 02/23/14 1046 02/23/14 1204 02/23/14 1316  BP: 127/64 130/64 146/48 114/43  Pulse: 65 62 82 66  Temp:  97 F (36.1 C)  97.6 F (36.4 C)  TempSrc:  Oral  Oral  Resp:  17  18  Height:      Weight:  77.8 kg (171 lb 8.3 oz)    SpO2:  98%  100%   Weight change: 1.872 kg (4 lb 2 oz)  Intake/Output Summary (Last 24 hours) at 02/23/14 1342 Last data filed at 02/23/14 1132  Gross per 24 hour  Intake 1327.17 ml  Output   2000 ml  Net -672.83 ml   General: Vital signs reviewed. Patient is well-developed and well-nourished, in no acute distress and cooperative with exam.  HEENT: Alopecia patches on scalp. Neck is supple, no carotid bruit present.  Cardiovascular: RRR Pulmonary/Chest: Clear to auscultation bilaterally, no wheezes, rales, or rhonchi. Abdominal: Soft, non-tender, non-distended, BS +, large, well-healed surgical scar Extremities: AVF in place in left lower extremity. No lower extremity edema bilaterally Skin: Warm, dry and intact. Psychiatric: Normal mood and affect. Speech and behavior is normal. Cognition and memory are normal.   Lab Results: Basic Metabolic Panel:  Recent Labs Lab 02/19/14 0620  02/21/14 0647 02/22/14 0423 02/23/14 0535  NA  --   < > 133* 134* 137  K  --   < > 4.6 4.6 6.1*  CL  --   < > 96 96 94*  CO2  --   < > 26 30 26   GLUCOSE  --   < > 137* 106* 106*  BUN  --   < > 64* 27* 42*  CREATININE  --   < > 11.05* 6.95* 9.00*  CALCIUM  --   < > 7.2* 7.7* 8.2*  MG 2.2  --   --   --   --   PHOS  --   < > 6.5* 5.1*  --   < > = values in this interval not displayed. Liver Function Tests:  Recent Labs Lab 02/19/14 0243 02/20/14 0345  02/21/14 0647 02/22/14 0423  AST 52* 36  --   --   --   ALT 33 29  --   --   --   ALKPHOS  82 76  --   --   --   BILITOT 0.6 0.6  --   --   --   PROT 6.9 6.5  --   --   --   ALBUMIN 2.8* 2.6*  < > 2.5* 2.7*  < > = values in this interval not displayed. CBC:  Recent Labs Lab 02/19/14 0243  02/22/14 0423 02/23/14 0535  WBC 4.1  < > 4.2 5.1  NEUTROABS 2.2  --   --   --   HGB 12.8  < > 11.7* 12.4  HCT 39.6  < > 36.3 37.3  MCV 98.3  < > 98.9 97.1  PLT 103*  < > 85* 97*  < > = values in this interval not displayed. Cardiac Enzymes:  Recent Labs Lab 02/19/14 0537 02/19/14 1200 02/19/14 1836  TROPONINI 0.04* 0.04* 0.05*   CBG:  Recent Labs Lab 02/22/14 0632 02/22/14 1150 02/22/14 1619 02/22/14 2133 02/23/14 0629 02/23/14 1117  GLUCAP 104* 140* 116* 139* 99 139*  Thyroid Function Tests:  Recent Labs Lab 02/19/14 1218  TSH 2.242   Coagulation:  Recent Labs Lab 02/20/14 0345 02/21/14 0500 02/22/14 0423 02/23/14 0535  LABPROT 15.3* 19.3* 21.9* 25.4*  INR 1.19 1.62* 1.89* 2.29*   Studies/Results: No results found. Medications:  I have reviewed the patient's current medications. Prior to Admission:  Prescriptions prior to admission  Medication Sig Dispense Refill Last Dose  . albuterol (PROVENTIL,VENTOLIN) 90 MCG/ACT inhaler Inhale 1-2 puffs into the lungs every 6 (six) hours as needed for wheezing or shortness of breath.    Past Month at Unknown time  . aspirin EC 81 MG tablet Take 81 mg by mouth daily.     02/18/2014 at Unknown time  . calcium acetate (PHOSLO) 667 MG capsule Take 2,001 mg by mouth 3 (three) times daily with meals.   02/18/2014 at Unknown time  . calcium carbonate (TUMS - DOSED IN MG ELEMENTAL CALCIUM) 500 MG chewable tablet Chew 1-2 tablets by mouth daily.    02/18/2014 at Unknown time  . cinacalcet (SENSIPAR) 30 MG tablet Take 30 mg by mouth daily.   02/18/2014 at Unknown time  . gabapentin (NEURONTIN) 300 MG capsule Take 300 mg by mouth at bedtime.    02/18/2014 at Unknown time  . insulin detemir (LEVEMIR) 100 UNIT/ML injection Inject  2-5 Units into the skin daily as needed (blood). 150-200 do 2 units >200 do 5 units   last month  . levothyroxine (SYNTHROID, LEVOTHROID) 150 MCG tablet Take 150 mcg by mouth daily before breakfast.   02/18/2014 at Unknown time  . LORazepam (ATIVAN) 0.5 MG tablet Take 0.5 mg by mouth at bedtime as needed for sleep.    Past Month at Unknown time  . metoprolol (LOPRESSOR) 50 MG tablet On non dialysis days take 1 & 1/2 tablet in the morning and 1 tablet in the evening. On dialysis days take 1 tablet twice daily 180 tablet 3 02/18/2014 at 2300  . ondansetron (ZOFRAN-ODT) 4 MG disintegrating tablet Take 4 mg by mouth every 8 (eight) hours as needed for nausea or vomiting.   unknown  . polyethylene glycol (MIRALAX / GLYCOLAX) packet Take 17 g by mouth daily as needed for mild constipation.    Past Month at Unknown time  . promethazine (PHENERGAN) 12.5 MG tablet Take 12.5 mg by mouth every 4 (four) hours as needed for nausea.    unknown  . tacrolimus (PROGRAF) 1 MG capsule Take 3 mg by mouth 2 (two) times daily.    02/18/2014 at Unknown time  . traMADol (ULTRAM) 50 MG tablet Take 50 mg by mouth every 12 (twelve) hours as needed for moderate pain.    unknown  . warfarin (COUMADIN) 3 MG tablet Take 3 mg by mouth daily.   02/18/2014 at Unknown time  . Multiple Vitamin (MULTIVITAMIN WITH MINERALS) TABS tablet Take 1 tablet by mouth at bedtime.    Past Week at Unknown time  . RENVELA 2.4 G PACK Take 2.4 g by mouth 3 (three) times daily with meals.    01/02/2014 at Unknown time  . VELPHORO 500 MG chewable tablet Chew 1 tablet by mouth 3 (three) times daily with meals.    01/02/2014 at Unknown time   Scheduled Meds: . aspirin EC  81 mg Oral Daily  . aspirin  325 mg Oral Once  . calcium acetate  2,001 mg Oral TID WC  . calcium carbonate  1-2 tablet Oral Daily  . cinacalcet  30 mg Oral Q lunch  .  gabapentin  300 mg Oral QHS  . gi cocktail  30 mL Oral Once  . insulin aspart  0-9 Units Subcutaneous TID WC  .  levothyroxine  150 mcg Oral QAC breakfast  . metoprolol  50 mg Oral BID  . sodium chloride  3 mL Intravenous Q12H  . tacrolimus  3 mg Oral BID  . warfarin  4 mg Oral ONCE-1800  . Warfarin - Pharmacist Dosing Inpatient   Does not apply q1800   Continuous Infusions: . heparin 1,100 Units/hr (02/23/14 1204)   PRN Meds:.albuterol, LORazepam, polyethylene glycol, promethazine, traMADol Assessment/Plan: Principal Problem:   Paroxysmal supraventricular tachycardia, s/p valve replacement, with chest pain. 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  -ASA 81 mg dailyl -Metoprolol 50 mg BID -Cardiac diet  Hyperkalemia: Potassium 6.1 this morning prior to dialysis. Patient was taken to her regularly scheduled dialysis session. Expect potassium to normalize after dialysis. She denies any complaints of chest pain or shortness of breath.  -HD today -Recheck renal function panel tomorrow am  CAD s/p CABG and Aortic Valve Replacement:  Patient was subtherapeutic on admission. Patient is on heparin gtt with bridge to coumadin. INR 2.29 this morning, goal 2.5. Once therapeutic, can be discharged to home. Patient will need follow up with INR checked a few days after discharge.  -ASA 81 mg daily -Metoprolol 50 mg BID -Heparin gtt bridging to coumadin  Chronic Grade 2 Diastolic Dysfunction: Echo on 02/20/14 showed an EF of 55% and mild aortic regurgitation. Patient is on ASA 81 mg daily and metoprolol at home.  -ASA 81 mg daily -Metoprolol 50 mg BID -Daily  weights -I/Os  ESRD on HD TTS: Patient is on HD TTS. She is on Phoslo TID, Sensipar, Velphoro and Renvela at home. HD completed yesterday. -Phoslo TID -Calcium Carbonate 200-400 mg 1-2 tabs daily -Sensipar 30 mg daily -Repeat renal function panel tomorrow am  T2DM: HgbA1c 6.3 on 12/15/13. Patient is on levemir 2-5 units at home. Glucose has been well controlled.  -Gabapentin 300 mg QHS -CBGs AC/HS -SSI-S  HTN: Elevated this morning, but improved and normalized after dialysis. BP 120s-140s/40-60s. Patient is on metoprolol at home.  -Continue Metoprolol 50 mg BID  DVT/PE ppx: Heparin gtt, Coumadin  Dispo: Disposition is deferred at this time, awaiting improvement of current medical problems.  Anticipated discharge in approximately 1-2 day(s) once INR therapeutic.  The patient does have a current PCP (No primary care provider on file.) and does not need an [facility] hospital follow-up appointment after discharge.  The patient does not have transportation limitations that hinder transportation to clinic appointments.  .Services Needed at time of discharge: Y = Yes, Blank = No PT:   OT:   RN:   Equipment:   Other:     LOS: 4 days   Osa Craver, DO PGY-1 Internal Medicine Resident Pager # [phone] 02/23/2014 1:42 PM

## 2014-02-24 DIAGNOSIS — Z952 Presence of prosthetic heart valve: Secondary | ICD-10-CM

## 2014-02-24 LAB — CBC
HCT: 35.6 % — ABNORMAL LOW (ref 36.0–46.0)
Hemoglobin: 11.4 g/dL — ABNORMAL LOW (ref 12.0–15.0)
MCH: 31.2 pg (ref 26.0–34.0)
MCHC: 32 g/dL (ref 30.0–36.0)
MCV: 97.5 fL (ref 78.0–100.0)
PLATELETS: 105 10*3/uL — AB (ref 150–400)
RBC: 3.65 MIL/uL — AB (ref 3.87–5.11)
RDW: 13.9 % (ref 11.5–15.5)
WBC: 4.5 10*3/uL (ref 4.0–10.5)

## 2014-02-24 LAB — RENAL FUNCTION PANEL
ALBUMIN: 2.6 g/dL — AB (ref 3.5–5.2)
Anion gap: 12 (ref 5–15)
BUN: 22 mg/dL (ref 6–23)
CALCIUM: 7.7 mg/dL — AB (ref 8.4–10.5)
CHLORIDE: 96 meq/L (ref 96–112)
CO2: 25 mmol/L (ref 19–32)
CREATININE: 6.52 mg/dL — AB (ref 0.50–1.10)
GFR calc Af Amer: 7 mL/min — ABNORMAL LOW (ref >90)
GFR calc non Af Amer: 6 mL/min — ABNORMAL LOW (ref >90)
Glucose, Bld: 105 mg/dL — ABNORMAL HIGH (ref 70–99)
Phosphorus: 4.4 mg/dL (ref 2.3–4.6)
Potassium: 4 mmol/L (ref 3.5–5.1)
SODIUM: 133 mmol/L — AB (ref 135–145)

## 2014-02-24 LAB — GLUCOSE, CAPILLARY
Glucose-Capillary: 113 mg/dL — ABNORMAL HIGH (ref 70–99)
Glucose-Capillary: 146 mg/dL — ABNORMAL HIGH (ref 70–99)

## 2014-02-24 LAB — HEPARIN LEVEL (UNFRACTIONATED): HEPARIN UNFRACTIONATED: 0.33 [IU]/mL (ref 0.30–0.70)

## 2014-02-24 LAB — PROTIME-INR
INR: 2.62 — ABNORMAL HIGH (ref 0.00–1.49)
Prothrombin Time: 28.2 seconds — ABNORMAL HIGH (ref 11.6–15.2)

## 2014-02-24 MED ORDER — WARFARIN SODIUM 4 MG PO TABS: 4.0000 mg | ORAL_TABLET | Freq: Every day | ORAL | Status: DC

## 2014-02-24 MED ORDER — WARFARIN SODIUM 3 MG PO TABS
3.0000 mg | ORAL_TABLET | Freq: Every day | ORAL | Status: DC
  Filled 2014-02-24: qty 1

## 2014-02-24 MED ORDER — WARFARIN SODIUM 4 MG PO TABS
4.0000 mg | ORAL_TABLET | Freq: Every day | ORAL | Status: DC
  Filled 2014-02-24: qty 1

## 2014-02-24 NOTE — Progress Notes (Addendum)
[place] for heparin, coumadin Indication: Mechanical heart valve - AVR  Allergies  Allergen Reactions  . Acetaminophen Other (See Comments)    Liver transplant recipient   . Codeine Itching  . Mirtazapine Other (See Comments)    hallucination  . Morphine Itching    Patient Measurements: Height: 5\' 4"  (162.6 cm) Weight: 169 lb 8 oz (76.885 kg) (scale C) IBW/kg (Calculated) : 54.7 Heparin Dosing Weight: 70 kg  Vital Signs: Temp: 97.8 F (36.6 C) (01/15 0555) Temp Source: Oral (01/15 0555) BP: 136/36 mmHg (01/15 0555) Pulse Rate: 64 (01/15 0555)  Labs:  Recent Labs  02/22/14 0423 02/23/14 0535 02/23/14 1927 02/24/14 0528  HGB 11.7* 12.4  --  11.4*  HCT 36.3 37.3  --  35.6*  PLT 85* 97*  --  105*  LABPROT 21.9* 25.4*  --  28.2*  INR 1.89* 2.29*  --  2.62*  HEPARINUNFRC 0.32 0.19* 0.39 0.33  CREATININE 6.95* 9.00*  --  6.52*    Estimated Creatinine Clearance: 9.3 mL/min (by C-G formula based on Cr of 6.52).  Assessment: 59 year old woman on warfarin prior to admission for a mechanical AVR. Heparin is therapeutic this am and INR is therapeutic as well,  Goal of Therapy:  HL 0.3-0.7 Monitor platelets by anticoagulation protocol: Yes   Plan:  D/c heparin Coumadin 3 mg po daily Close INR f/u at discharge  Thanks for allowing pharmacy to be a part of this patient's care.  Excell Seltzer, PharmD Clinical Pharmacist [phone] 02/24/2014,7:44 AM

## 2014-02-24 NOTE — Progress Notes (Signed)
Subjective:  Patient was seen and examined this morning. Patient is ready to go home. She denies any complaints. No recurrent palpitations or rapid heart rate.  Objective: Vital signs in last 24 hours: Filed Vitals:   02/23/14 2250 02/24/14 0153 02/24/14 0555 02/24/14 1027  BP: 156/62 141/49 136/36 152/40  Pulse:  66 64 62  Temp:  98.4 F (36.9 C) 97.8 F (36.6 C)   TempSrc:  Oral Oral   Resp:  18 18   Height:      Weight:   76.885 kg (169 lb 8 oz)   SpO2:  94% 99%    Weight change: -0.672 kg (-1 lb 7.7 oz)  Intake/Output Summary (Last 24 hours) at 02/24/14 1328 Last data filed at 02/24/14 1322  Gross per 24 hour  Intake 937.93 ml  Output      0 ml  Net 937.93 ml   General: Vital signs reviewed. Patient is well-developed and well-nourished, in no acute distress and cooperative with exam.  HEENT: Alopecia patches on scalp. Neck is supple, no carotid bruit present.  Cardiovascular: RRR Pulmonary/Chest: Clear to auscultation bilaterally, no wheezes, rales, or rhonchi. Abdominal: Soft, non-tender, non-distended, BS +, large, well-healed surgical scar Extremities: AVF in place in left lower extremity. No lower extremity edema bilaterally Skin: Warm, dry and intact. Psychiatric: Normal mood and affect. Speech and behavior is normal. Cognition and memory are normal.   Lab Results: Basic Metabolic Panel:  Recent Labs Lab 02/19/14 0620  02/22/14 0423 02/23/14 0535 02/24/14 0528  NA  --   < > 134* 137 133*  K  --   < > 4.6 6.1* 4.0  CL  --   < > 96 94* 96  CO2  --   < > 30 26 25   GLUCOSE  --   < > 106* 106* 105*  BUN  --   < > 27* 42* 22  CREATININE  --   < > 6.95* 9.00* 6.52*  CALCIUM  --   < > 7.7* 8.2* 7.7*  MG 2.2  --   --   --   --   PHOS  --   < > 5.1*  --  4.4  < > = values in this interval not displayed. Liver Function Tests:  Recent Labs Lab 02/19/14 0243 02/20/14 0345  02/22/14 0423 02/24/14 0528  AST 52* 36  --   --   --   ALT 33 29  --   --    --   ALKPHOS 82 76  --   --   --   BILITOT 0.6 0.6  --   --   --   PROT 6.9 6.5  --   --   --   ALBUMIN 2.8* 2.6*  < > 2.7* 2.6*  < > = values in this interval not displayed. CBC:  Recent Labs Lab 02/19/14 0243  02/23/14 0535 02/24/14 0528  WBC 4.1  < > 5.1 4.5  NEUTROABS 2.2  --   --   --   HGB 12.8  < > 12.4 11.4*  HCT 39.6  < > 37.3 35.6*  MCV 98.3  < > 97.1 97.5  PLT 103*  < > 97* 105*  < > = values in this interval not displayed. Cardiac Enzymes:  Recent Labs Lab 02/19/14 0537 02/19/14 1200 02/19/14 1836  TROPONINI 0.04* 0.04* 0.05*   CBG:  Recent Labs Lab 02/23/14 0629 02/23/14 1117 02/23/14 1625 02/23/14 2055 02/24/14 0644 02/24/14 1109  GLUCAP  99 139* 101* 127* 113* 146*   Thyroid Function Tests:  Recent Labs Lab 02/19/14 1218  TSH 2.242   Coagulation:  Recent Labs Lab 02/21/14 0500 02/22/14 0423 02/23/14 0535 02/24/14 0528  LABPROT 19.3* 21.9* 25.4* 28.2*  INR 1.62* 1.89* 2.29* 2.62*   Medications:  I have reviewed the patient's current medications. Prior to Admission:  Prescriptions prior to admission  Medication Sig Dispense Refill Last Dose  . albuterol (PROVENTIL,VENTOLIN) 90 MCG/ACT inhaler Inhale 1-2 puffs into the lungs every 6 (six) hours as needed for wheezing or shortness of breath.    Past Month at Unknown time  . aspirin EC 81 MG tablet Take 81 mg by mouth daily.     02/18/2014 at Unknown time  . calcium acetate (PHOSLO) 667 MG capsule Take 2,001 mg by mouth 3 (three) times daily with meals.   02/18/2014 at Unknown time  . calcium carbonate (TUMS - DOSED IN MG ELEMENTAL CALCIUM) 500 MG chewable tablet Chew 1-2 tablets by mouth daily.    02/18/2014 at Unknown time  . cinacalcet (SENSIPAR) 30 MG tablet Take 30 mg by mouth daily.   02/18/2014 at Unknown time  . gabapentin (NEURONTIN) 300 MG capsule Take 300 mg by mouth at bedtime.    02/18/2014 at Unknown time  . insulin detemir (LEVEMIR) 100 UNIT/ML injection Inject 2-5 Units into the  skin daily as needed (blood). 150-200 do 2 units >200 do 5 units   last month  . levothyroxine (SYNTHROID, LEVOTHROID) 150 MCG tablet Take 150 mcg by mouth daily before breakfast.   02/18/2014 at Unknown time  . LORazepam (ATIVAN) 0.5 MG tablet Take 0.5 mg by mouth at bedtime as needed for sleep.    Past Month at Unknown time  . metoprolol (LOPRESSOR) 50 MG tablet On non dialysis days take 1 & 1/2 tablet in the morning and 1 tablet in the evening. On dialysis days take 1 tablet twice daily 180 tablet 3 02/18/2014 at 2300  . ondansetron (ZOFRAN-ODT) 4 MG disintegrating tablet Take 4 mg by mouth every 8 (eight) hours as needed for nausea or vomiting.   unknown  . polyethylene glycol (MIRALAX / GLYCOLAX) packet Take 17 g by mouth daily as needed for mild constipation.    Past Month at Unknown time  . promethazine (PHENERGAN) 12.5 MG tablet Take 12.5 mg by mouth every 4 (four) hours as needed for nausea.    unknown  . tacrolimus (PROGRAF) 1 MG capsule Take 3 mg by mouth 2 (two) times daily.    02/18/2014 at Unknown time  . traMADol (ULTRAM) 50 MG tablet Take 50 mg by mouth every 12 (twelve) hours as needed for moderate pain.    unknown  . [DISCONTINUED] warfarin (COUMADIN) 3 MG tablet Take 3 mg by mouth daily.   02/18/2014 at Unknown time  . Multiple Vitamin (MULTIVITAMIN WITH MINERALS) TABS tablet Take 1 tablet by mouth at bedtime.    Past Week at Unknown time  . RENVELA 2.4 G PACK Take 2.4 g by mouth 3 (three) times daily with meals.    01/02/2014 at Unknown time  . VELPHORO 500 MG chewable tablet Chew 1 tablet by mouth 3 (three) times daily with meals.    01/02/2014 at Unknown time   Scheduled Meds: . aspirin EC  81 mg Oral Daily  . aspirin  325 mg Oral Once  . calcium acetate  2,001 mg Oral TID WC  . calcium carbonate  1-2 tablet Oral Daily  . cinacalcet  30  mg Oral Q lunch  . gabapentin  300 mg Oral QHS  . gi cocktail  30 mL Oral Once  . insulin aspart  0-9 Units Subcutaneous TID WC  . levothyroxine   150 mcg Oral QAC breakfast  . metoprolol  50 mg Oral BID  . sodium chloride  3 mL Intravenous Q12H  . tacrolimus  3 mg Oral BID  . warfarin  3 mg Oral q1800  . Warfarin - Pharmacist Dosing Inpatient   Does not apply q1800   Continuous Infusions:   PRN Meds:.albuterol, LORazepam, polyethylene glycol, promethazine, traMADol Assessment/Plan: Principal Problem:   Paroxysmal supraventricular tachycardia, s/p valve replacement, with chest pain. 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 Patient will be discharged home on home regimen of ASA and metoprolol with follow up with Cardiology in 2 weeks.  -ASA 81 mg dailyl -Metoprolol 50 mg BID -Cardiac diet  Hyperkalemia: Resolved. potassium 4.0 this morning.  -HD tomorrow  CAD s/p CABG and Aortic Valve Replacement:  Patient was subtherapeutic on admission while taking warfarin 3 mg daily. Patient's INR is therapeutic this morning at 2.62. Her goal is 2.5-3.5. Patient will be discharged home on Warfarin 4 mg daily with follow up and INR check on 03/01/14 with clinical pharmacist with [facility].  -ASA 81 mg daily -Metoprolol 50 mg BID -Discharge on coumadin 4 mg daily  Chronic Grade 2 Diastolic Dysfunction: Echo on 02/20/14 showed an EF of 55% and mild aortic regurgitation. Patient is on ASA 81 mg daily and metoprolol at home.  -ASA 81 mg daily -Metoprolol 50 mg BID  ESRD on HD TTS: Patient is on HD TTS. She is on Phoslo TID, Sensipar, Velphoro and Renvela at home. HD completed yesterday. -Phoslo TID -Calcium  Carbonate 200-400 mg 1-2 tabs daily -Sensipar 30 mg daily  T2DM: HgbA1c 6.3 on 12/15/13. Patient is on levemir 2-5 units at home. Glucose has been well controlled.  -Gabapentin 300 mg QHS -CBGs AC/HS -SSI-S  HTN: BP 120s-150s/40-60s. Patient is on metoprolol at home.  -Continue Metoprolol 50 mg BID  DVT/PE ppx: Coumadin   Dispo: Anticipated discharge today.  The patient does have a current PCP (No primary care provider on file.) and does not need an [facility] hospital follow-up appointment after discharge.  The patient does not have transportation limitations that hinder transportation to clinic appointments.  .Services Needed at time of discharge: Y = Yes, Blank = No PT:   OT:   RN:   Equipment:   Other:     LOS: 5 days   Osa Craver, DO PGY-1 Internal Medicine Resident Pager # [phone] 02/24/2014 1:28 PM

## 2014-02-24 NOTE — Progress Notes (Signed)
Patient ID: [name], female   DOB: Jan 23, 1956, 59 y.o.   MRN: [id number]  Medicine attending discharge note: I personally interviewed and examined this patient on the day of discharge and I attest to the accuracy of the discharge evaluation and management plan as recorded by resident physician Dr. Osa Craver in her 02/24/2014 progress note.  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  She had transplant-related renal dysfunction which has progressed and she is now on dialysis. She has underlying coronary and valvular heart disease. She is post coronary bypass surgery and post mechanical aortic valve replacement in February 2015. Coronary grafts were patent at time of most recent catheterization in November 2015. She has grade 2 diastolic dysfunction with chronic congestive heart failure. She presented on the day of the current admission with acute onset of chest pain with associated dyspnea, palpitations, lightheadedness, nausea, and vomiting. Electrocardiogram on arrival in the emergency department showed supraventricular tachycardia at a rate of 154. Prior to receiving medical therapy for this, she spontaneously reverted to sinus rhythm. Serial troponins did not elevate. Repeat Cardiogram showed she was back in normal sinus rhythm with minimal changes compared to her previous tracings. T waves chronically inverted in leads 1 and aVL. Fluctuating changes of the T waves in lead V5. Biphasic in lead V6. Chest radiograph showed presence of mechanical aortic heart valve. No signs of acute congestive failure. No other pathology. Her warfarin level was subtherapeutic.  Hospital course: She was seen in consultation by cardiology. No changes recommended in her medical regimen. She was followed by nephrology in view of her end-stage renal disease. She was started on  unfractionated heparin. Warfarin dose was adjusted until therapeutic and she will be discharged on a dose of 4 mg daily. INR 2.6 to January 15. There were no complications.

## 2014-02-24 NOTE — Progress Notes (Signed)
All d/c instructions explained and given to pt.  Verbalized understanding.  D/C off floor via ambulatory to awaiting transport to home, declined w/c service,  Escorted by her daughter.

## 2014-02-24 NOTE — Discharge Instructions (Signed)
Thank you for allowing [place] to be involved in your healthcare while you were hospitalized at [facility].   Please note that there have been changes to your home medications.  --> PLEASE LOOK AT YOUR DISCHARGE MEDICATION LIST FOR DETAILS.   Please call your PCP if you have any questions or concerns, or any difficulty getting any of your medications.  Please return to the ER if you have worsening of your symptoms or new severe symptoms arise.  FOR YOUR MECHANICAL VALVE REPLACEMENT: -TAKE COUMADIN 4 MG DAILY  -FOLLOW UP WITH CHRISTINE ON Wednesday, January 20th at 11:50 am for an INR check  Continue your other medications as prescribed.   Aortic Valve Replacement  Aortic valve replacement is a procedure to replace an aortic valve that cannot be repaired. An artificial (prosthetic) valve is used to do this. Three types of prosthetic valves are available:   Mechanical valves made entirely from man-made materials.   Donor valves made from human donors. These are only used in special situations.   Biological valves made from animal tissues.  The type of prosthetic valve used will be determined based on various factors, including your age, your lifestyle, and other medical conditions you have.  LET [facility] CARE PROVIDER KNOW ABOUT:  Any allergies you have.  All medicines you are taking, including vitamins, herbs, eye drops, creams, and over-the-counter medicines.  Previous problems you or members of your family have had with the use of anesthetics.  Any blood disorders you have.  Previous surgeries you have had.  Medical conditions you have. RISKS AND COMPLICATIONS Generally, this is a safe procedure. However, as with any procedure, problems can occur. Possible problems include:   Blood clotting caused by the new valve. Replacement with a mechanical valve requires lifelong treatment with medicine to prevent blood clots.   Infection in the new valve.    Valve failure.   Bleeding.   Reaction to anesthetics.  BEFORE THE PROCEDURE  Ask your health care provider about:  Changing or stopping your regular medicines. This is especially important if you are taking diabetes medicines or blood thinners.  Taking medicines such as aspirin and ibuprofen. These medicines can thin your blood. Do not take these medicines before your procedure if your health care provider asks you not to.  Do not eat or drink anything after midnight on the night before the procedure or as directed by your health care provider. PROCEDURE  The surgeon may use either an open technique or a minimally invasive technique for this surgery:  Traditional open surgery  You will be given a medicine that makes you fall asleep (general anesthetic).  You will then be placed on a heart-lung bypass machine. This machine provides oxygen to your blood while the heart is undergoing surgery.  During surgery, the surgeon will make a large cut (incision) in the chest.  The heart will be cooled to slow or stop the heartbeat.  The damaged aortic valve will be removed and replaced with a prosthetic heart valve.  The incision will then be closed with staples or stitches. Minimally invasive surgery This is done through a smaller incision. This still requires general anesthetic and the heart-lung bypass machine. Your heart will be cooled to slow or stop the heartbeat, allowing the damaged valve to be removed and replaced with the new valve. The smaller incision will then be closed. If your condition allows for this procedure, there is often less blood loss, less pain, and  faster recovery compared to traditional open surgery.  AFTER THE PROCEDURE You will be monitored closely in a recovery area. From there, you will likely go to an intensive care unit.  Document Released: 06/18/2004 Document Revised: 06/13/2013 Document Reviewed: 07/06/2012 [facility] Patient Information 2015  [place], [place]. This information is not intended to replace advice given to you by your health care provider. Make sure you discuss any questions you have with your health care provider.

## 2014-02-24 NOTE — Discharge Summary (Signed)
Name: [name] MRN: [id number] DOB: 1955-04-12 59 y.o. PCP: No primary care provider on file.  Date of Admission: 02/19/2014  1:31 AM Date of Discharge: 03/01/2014 Attending Physician: No att. providers found  Discharge Diagnosis:  Principal Problem:   Paroxysmal supraventricular tachycardia, s/p valve replacement, with chest pain. 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     aspirin EC 81 MG tablet  Take 81 mg by mouth daily.     calcium acetate 667 MG capsule  Commonly known as:  PHOSLO  Take 2,001 mg by mouth 3 (three) times daily with meals.     calcium carbonate 500 MG chewable tablet  Commonly known as:  TUMS - dosed in mg elemental calcium  Chew 1-2 tablets by mouth daily.     cinacalcet 30 MG tablet  Commonly known as:  SENSIPAR  Take 30 mg by mouth daily.     gabapentin 300 MG capsule  Commonly known as:  NEURONTIN  Take 300 mg by mouth at bedtime.     insulin detemir 100 UNIT/ML injection  Commonly known as:  LEVEMIR  - Inject 2-5 Units into the skin daily as needed (blood). 150-200 do 2 units  - >200 do 5 units     levothyroxine 150 MCG tablet  Commonly known as:  SYNTHROID, LEVOTHROID  Take 150 mcg by mouth daily before breakfast.     LORazepam 0.5 MG tablet  Commonly known as:  ATIVAN  Take 0.5 mg by mouth at bedtime as needed for sleep.     metoprolol 50 MG tablet  Commonly known as:  LOPRESSOR  On non  dialysis days take 1 & 1/2 tablet in the morning and 1 tablet in the evening. On dialysis days take 1 tablet twice daily     multivitamin with minerals Tabs tablet  Take 1 tablet by mouth at bedtime.     ondansetron 4 MG disintegrating tablet  Commonly known as:  ZOFRAN-ODT  Take 4 mg by mouth every 8 (eight) hours as needed for nausea or vomiting.     polyethylene glycol packet  Commonly known as:  MIRALAX / GLYCOLAX  Take 17 g by mouth daily as needed for mild constipation.     promethazine 12.5 MG tablet  Commonly known as:  PHENERGAN  Take 12.5 mg by mouth every 4 (four) hours as needed for nausea.     RENVELA 2.4 G Pack  Generic drug:  sevelamer carbonate  Take 2.4 g by mouth 3 (three) times daily with meals.     tacrolimus 1 MG capsule  Commonly known as:  PROGRAF  Take 3 mg by mouth 2 (two) times daily.     traMADol 50 MG tablet  Commonly known as:  ULTRAM  Take 50 mg by mouth every 12 (twelve) hours as needed for moderate pain.     VELPHORO 500 MG  chewable tablet  Generic drug:  sucroferric oxyhydroxide  Chew 1 tablet by mouth 3 (three) times daily with meals.     warfarin 4 MG tablet  Commonly known as:  COUMADIN  Take 1 tablet (4 mg total) by mouth daily.        Disposition and follow-up:   Ms.[name] was discharged from [facility] in Good condition.  At the hospital follow up visit please address:  1.  Paroxysmal SVT: Please address recurrence of tachycardia or palpitations and compliance with metoprolol.  CAD s/p CABG and Aortic Valve Replacement: Please address compliance with coumadin and consider rechecking INR. Goal 2.5-3.5.  2.  Labs / imaging needed at time of follow-up: INR  3.  Pending labs/ test needing follow-up: None  Follow-up Appointments: Follow-up Information    Follow up with Lyda Jester, PA-C On 03/10/2014.   Specialty:  Cardiology   Why:  1:00 pm   Contact information:   [address] [place] [id number] [phone]       Please follow up.   Why:  FOLLOW UP WITH [place] ON 03/01/14 AT 11:50 AM FOR INR CHECK      Discharge Instructions: Discharge Instructions    Diet - low sodium heart healthy    Complete by:  As directed      Increase activity slowly    Complete by:  As directed            Consultations: Treatment Team:  Sol Blazing, MD  Procedures Performed:  Dg Chest 2 View  02/19/2014   CLINICAL DATA:  Sharp chest pain with palpitations, headache, and shortness of breath. Currently on warfarin. Smoker. Dialysis patient.  EXAM: CHEST  2 VIEW  COMPARISON:  01/03/2014  FINDINGS: Postoperative changes in the mediastinum, lower lumbar spine, and upper abdomen. Cardiac valve prosthesis. The heart size and mediastinal contours are within normal limits. Both lungs are clear. The visualized skeletal structures are unremarkable. Calcification of the aorta.  IMPRESSION: No active cardiopulmonary disease.   Electronically Signed   By: Lucienne Capers M.D.   On: 02/19/2014 02:58    2D Echo:  Study Conclusions  - Left ventricle: The cavity size was mildly dilated. Wall thickness was normal. The estimated ejection fraction was 55%. Doppler parameters are consistent with elevated ventricular end-diastolic filling pressure. - Aortic valve: Mechanical AVR Normal systolic gradinet Mild perivalvular regurgitation and also mild closing volume more central AR seen Valve area (VTI): 1.87 cm^2. Valve area (Vmax): 1.94 cm^2. Valve area (Vmean): 1.7 cm^2. - Mitral valve: There was mild regurgitation. - Left atrium: The atrium was mildly dilated. - Atrial septum: No defect or patent foramen ovale was identified.  Admission HPI: Ms. Fort is a 59 yo female with PMHx of CAD s/p CABG, AS s/p aortic valve replacement, ESRD on HD TTS, Hepatitis C s/p Liver transplant, T2DM, COPD, HTN, Hypothyroidism, and GERD who presents with  complaint of chest pain and palpitations. Patient states at 0000 last night she had a sudden onset of non-radiating substernal chest tightness 10/10 associated with dyspnea, nausea, vomiting and palpitations with a rapid heart rate. She was also diaphoretic, dizzy and weak. Patient called her daughter and then called EMS who brought her to the ED. Original EKG showed sinus rhythm but patient later converted to a narrow complex tachycardia consistent with SVT, rate of 154. Patient was going to receive adenosine, but she converted back to sinus rhythm on  her own. Patient has stayed in sinus rhythm, but she continues to have substernal chest tightness, shortness of breath, weakness, nausea with vomiting and dizziness when she sits up. She feels very fatigued and weak. Patient and daughter report that this has been occuring every 2-3 weeks for the past 5 months. Patient's last cath was in November and normal. Patient states she has been taking all of her medications as prescribed without any missed doses. She had dialysis yesterday and became hypotensive, but completed her session. She felt unwell the rest of the day following dialysis.   Hospital Course by problem list: Principal Problem:   Paroxysmal supraventricular tachycardia, s/p valve replacement, with chest pain. 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 Original EKG showed sinus rhythm but patient later converted to a narrow complex tachycardia consistent with SVT, rate of  154. Patient converted back to sinus rhythm on her own without adenosine. Patient has a history of frequent chest pain with palpitations that occurs every 2-3 weeks for the last several months. Patient is on ASA, Warfarin and metoprolol at home. She had been compliant with her medications. Troponins were negative x 2. During hospital course, patient has no recurrent SVT and was rate controlled in the 60s. Patient was seen by cardiology who recommended continuing ASA 81 mg daily, metoprolol 50 mg BID and follow up with cardiology as outpatient.   CAD s/p CABG and Aortic Valve Replacement: Patient had an aortic valve replacement and CABG x 1 with left IMA to LAD on 03/21/13 following a cath in January 2015 which showed mild to moderate coronary disease with small diagonal having 70% stenosis and LAD 50% stenosis and moderately severe aortic valve stenosis. Repeat cath 12/16/13 showed 70% mid-LAD stenosis and normal RCA with patent left internal mammary artery graft to mid LAD. Recommendations were to continue medical therapy. Patient is on ASA, metoprolol, and warfarin at home. INR was subtherapeutic at 1.23 on admission. We did a heparin bridge to coumadin during admission until INR became therapeutic. 2.62 on discharge with goal range of 2.5-3.5. Patient was discharged home with warfarin 4 mg daily and follow up with cardiology for INR check on 03/01/14.  Chronic Grade 2 Diastolic Dysfunction: Echo on 01/03/14 showed an EF of 55-60% and grade 2 diastolic dysfunction. Patient is on ASA 81 mg daily and metoprolol at home. This remained stable during admission and on discharge.   ESRD on HD TTS: Patient is on HD TTS. She is on Phoslo TiD, Sensipar, Velphoro and Renvela at home. HD was continued during admission and well tolerated.  Hepatitis C s/p Liver Transplant: Patient had a liver transplant in 2010. AST 52 (baseline 25), ALT 33 (baseline 20). Patient is on Prograf 3 mg BID with which she has been compliant.  Tacrolimus 3 mg BID was continued during admission and on discharge.  Hypothyroidism: Last TSH normal at 1.096 on 12/28/13. Patient is on levothyroxine 150 mcg at home. This was continued during stay and on discharge.   T2DM: HgbA1c 6.3  on 12/15/13. Patient is on levemir 2-5 units at home. Patient was managed on SSI-S.  HTN: BP 120s-150s/40-60s. Patient is on metoprolol at home which was continued.   COPD: Stable. Patient is on albuterol inhaler at home. CXR showed no active cardiopulmonary disease. Patient did not require Albuterol 1-2 puffs Q6H prn.  Constipation: Patient complained of chronic constipation. We ordered Miralax 17 grams daily prn.  Discharge Vitals:   BP 152/40 mmHg  Pulse 62  Temp(Src) 97.8 F (36.6 C) (Oral)  Resp 18  Ht 5\' 4"  (1.626 m)  Wt 76.885 kg (169 lb 8 oz)  BMI 29.08 kg/m2  SpO2 99%  Signed: Osa Craver, DO PGY-1 Internal Medicine Resident Pager # [phone] 03/01/2014 12:23 PM

## 2014-03-01 ENCOUNTER — Ambulatory Visit: Payer: Medicare Other | Admitting: Pharmacist Clinician (PhC)/ Clinical Pharmacy Specialist

## 2014-03-02 DIAGNOSIS — I359 Nonrheumatic aortic valve disorder, unspecified: Secondary | ICD-10-CM | POA: Diagnosis not present

## 2014-03-03 LAB — POCT INR: INR: 3

## 2014-03-06 ENCOUNTER — Ambulatory Visit (INDEPENDENT_AMBULATORY_CARE_PROVIDER_SITE_OTHER): Payer: Medicare Other | Admitting: Pharmacist Clinician (PhC)/ Clinical Pharmacy Specialist

## 2014-03-06 DIAGNOSIS — Z954 Presence of other heart-valve replacement: Secondary | ICD-10-CM

## 2014-03-06 DIAGNOSIS — Z952 Presence of prosthetic heart valve: Secondary | ICD-10-CM

## 2014-03-07 ENCOUNTER — Inpatient Hospital Stay (HOSPITAL_COMMUNITY)
Admission: EM | Admit: 2014-03-07 | Discharge: 2014-03-09 | DRG: 189 | Disposition: A | Discharge status: Home / Self Care | Payer: Medicare Other | Attending: Internal Medicine | Admitting: Internal Medicine

## 2014-03-07 ENCOUNTER — Encounter (HOSPITAL_COMMUNITY): Payer: Self-pay

## 2014-03-07 ENCOUNTER — Emergency Department (HOSPITAL_COMMUNITY): Payer: Medicare Other

## 2014-03-07 DIAGNOSIS — K219 Gastro-esophageal reflux disease without esophagitis: Secondary | ICD-10-CM | POA: Diagnosis present

## 2014-03-07 DIAGNOSIS — E8770 Fluid overload, unspecified: Secondary | ICD-10-CM | POA: Diagnosis not present

## 2014-03-07 DIAGNOSIS — Z952 Presence of prosthetic heart valve: Secondary | ICD-10-CM

## 2014-03-07 DIAGNOSIS — Z7901 Long term (current) use of anticoagulants: Secondary | ICD-10-CM | POA: Diagnosis not present

## 2014-03-07 DIAGNOSIS — E872 Acidosis: Secondary | ICD-10-CM | POA: Diagnosis present

## 2014-03-07 DIAGNOSIS — N186 End stage renal disease: Secondary | ICD-10-CM | POA: Diagnosis present

## 2014-03-07 DIAGNOSIS — E039 Hypothyroidism, unspecified: Secondary | ICD-10-CM | POA: Diagnosis present

## 2014-03-07 DIAGNOSIS — Z951 Presence of aortocoronary bypass graft: Secondary | ICD-10-CM

## 2014-03-07 DIAGNOSIS — E119 Type 2 diabetes mellitus without complications: Secondary | ICD-10-CM | POA: Diagnosis present

## 2014-03-07 DIAGNOSIS — Z992 Dependence on renal dialysis: Secondary | ICD-10-CM

## 2014-03-07 DIAGNOSIS — J810 Acute pulmonary edema: Principal | ICD-10-CM | POA: Diagnosis present

## 2014-03-07 DIAGNOSIS — R079 Chest pain, unspecified: Secondary | ICD-10-CM

## 2014-03-07 DIAGNOSIS — I2510 Atherosclerotic heart disease of native coronary artery without angina pectoris: Secondary | ICD-10-CM | POA: Diagnosis present

## 2014-03-07 DIAGNOSIS — K5900 Constipation, unspecified: Secondary | ICD-10-CM | POA: Diagnosis present

## 2014-03-07 DIAGNOSIS — R0600 Dyspnea, unspecified: Secondary | ICD-10-CM | POA: Diagnosis present

## 2014-03-07 DIAGNOSIS — J449 Chronic obstructive pulmonary disease, unspecified: Secondary | ICD-10-CM | POA: Diagnosis present

## 2014-03-07 DIAGNOSIS — R0789 Other chest pain: Secondary | ICD-10-CM | POA: Diagnosis not present

## 2014-03-07 DIAGNOSIS — Z794 Long term (current) use of insulin: Secondary | ICD-10-CM | POA: Diagnosis not present

## 2014-03-07 DIAGNOSIS — F17210 Nicotine dependence, cigarettes, uncomplicated: Secondary | ICD-10-CM | POA: Diagnosis present

## 2014-03-07 DIAGNOSIS — D649 Anemia, unspecified: Secondary | ICD-10-CM | POA: Diagnosis present

## 2014-03-07 DIAGNOSIS — Z954 Presence of other heart-valve replacement: Secondary | ICD-10-CM | POA: Diagnosis not present

## 2014-03-07 DIAGNOSIS — F329 Major depressive disorder, single episode, unspecified: Secondary | ICD-10-CM | POA: Diagnosis present

## 2014-03-07 DIAGNOSIS — I120 Hypertensive chronic kidney disease with stage 5 chronic kidney disease or end stage renal disease: Secondary | ICD-10-CM | POA: Diagnosis present

## 2014-03-07 DIAGNOSIS — D631 Anemia in chronic kidney disease: Secondary | ICD-10-CM | POA: Diagnosis not present

## 2014-03-07 DIAGNOSIS — Z944 Liver transplant status: Secondary | ICD-10-CM | POA: Diagnosis not present

## 2014-03-07 DIAGNOSIS — N2581 Secondary hyperparathyroidism of renal origin: Secondary | ICD-10-CM | POA: Diagnosis present

## 2014-03-07 DIAGNOSIS — E875 Hyperkalemia: Secondary | ICD-10-CM | POA: Diagnosis present

## 2014-03-07 DIAGNOSIS — R0602 Shortness of breath: Secondary | ICD-10-CM | POA: Diagnosis not present

## 2014-03-07 DIAGNOSIS — Z79899 Other long term (current) drug therapy: Secondary | ICD-10-CM | POA: Diagnosis not present

## 2014-03-07 DIAGNOSIS — Z7982 Long term (current) use of aspirin: Secondary | ICD-10-CM

## 2014-03-07 DIAGNOSIS — M898X9 Other specified disorders of bone, unspecified site: Secondary | ICD-10-CM | POA: Diagnosis present

## 2014-03-07 LAB — COMPREHENSIVE METABOLIC PANEL
ALBUMIN: 3.1 g/dL — AB (ref 3.5–5.2)
ALT: 35 U/L (ref 0–35)
AST: 42 U/L — ABNORMAL HIGH (ref 0–37)
Alkaline Phosphatase: 88 U/L (ref 39–117)
Anion gap: 14 (ref 5–15)
BUN: 46 mg/dL — ABNORMAL HIGH (ref 6–23)
CO2: 24 mmol/L (ref 19–32)
Calcium: 7.7 mg/dL — ABNORMAL LOW (ref 8.4–10.5)
Chloride: 99 mmol/L (ref 96–112)
Creatinine, Ser: 11.16 mg/dL — ABNORMAL HIGH (ref 0.50–1.10)
GFR calc Af Amer: 4 mL/min — ABNORMAL LOW (ref >90)
GFR, EST NON AFRICAN AMERICAN: 3 mL/min — AB (ref >90)
GLUCOSE: 137 mg/dL — AB (ref 70–99)
Potassium: 6 mmol/L — ABNORMAL HIGH (ref 3.5–5.1)
Sodium: 137 mmol/L (ref 135–145)
TOTAL PROTEIN: 7.6 g/dL (ref 6.0–8.3)
Total Bilirubin: 0.9 mg/dL (ref 0.3–1.2)

## 2014-03-07 LAB — BASIC METABOLIC PANEL
Anion gap: 13 (ref 5–15)
BUN: 22 mg/dL (ref 6–23)
CALCIUM: 8.1 mg/dL — AB (ref 8.4–10.5)
CHLORIDE: 96 mmol/L (ref 96–112)
CO2: 25 mmol/L (ref 19–32)
Creatinine, Ser: 5.4 mg/dL — ABNORMAL HIGH (ref 0.50–1.10)
GFR calc Af Amer: 9 mL/min — ABNORMAL LOW (ref >90)
GFR calc non Af Amer: 8 mL/min — ABNORMAL LOW (ref >90)
Glucose, Bld: 151 mg/dL — ABNORMAL HIGH (ref 70–99)
POTASSIUM: 3.9 mmol/L (ref 3.5–5.1)
Sodium: 134 mmol/L — ABNORMAL LOW (ref 135–145)

## 2014-03-07 LAB — PROTIME-INR
INR: 2.65 — ABNORMAL HIGH (ref 0.00–1.49)
Prothrombin Time: 28.5 seconds — ABNORMAL HIGH (ref 11.6–15.2)

## 2014-03-07 LAB — CBC
HCT: 36 % (ref 36.0–46.0)
Hemoglobin: 11.5 g/dL — ABNORMAL LOW (ref 12.0–15.0)
MCH: 31.3 pg (ref 26.0–34.0)
MCHC: 31.9 g/dL (ref 30.0–36.0)
MCV: 97.8 fL (ref 78.0–100.0)
PLATELETS: DECREASED 10*3/uL (ref 150–400)
RBC: 3.68 MIL/uL — AB (ref 3.87–5.11)
RDW: 14.4 % (ref 11.5–15.5)
WBC: 5.8 10*3/uL (ref 4.0–10.5)

## 2014-03-07 LAB — BRAIN NATRIURETIC PEPTIDE: B Natriuretic Peptide: 1902.3 pg/mL — ABNORMAL HIGH (ref 0.0–100.0)

## 2014-03-07 LAB — TROPONIN I
TROPONIN I: 0.04 ng/mL — AB (ref <0.031)
TROPONIN I: 0.03 ng/mL (ref <0.031)

## 2014-03-07 LAB — POTASSIUM
POTASSIUM: 6.3 mmol/L — AB (ref 3.5–5.1)
POTASSIUM: 3.8 mmol/L (ref 3.5–5.1)
Potassium: 4.5 mmol/L (ref 3.5–5.1)
Potassium: 3.7 mmol/L (ref 3.5–5.1)

## 2014-03-07 LAB — GLUCOSE, CAPILLARY
Glucose-Capillary: 84 mg/dL (ref 70–99)
Glucose-Capillary: 156 mg/dL — ABNORMAL HIGH (ref 70–99)

## 2014-03-07 LAB — I-STAT TROPONIN, ED: TROPONIN I, POC: 0.02 ng/mL (ref 0.00–0.08)

## 2014-03-07 MED ORDER — WARFARIN - PHARMACIST DOSING INPATIENT: Freq: Every day | Status: DC

## 2014-03-07 MED ORDER — CALCITRIOL 0.5 MCG PO CAPS
0.5000 ug | ORAL_CAPSULE | ORAL | Status: DC
  Administered 2014-03-07 – 2014-03-09 (×2): 0.5 ug via ORAL
  Filled 2014-03-07 (×3): qty 1

## 2014-03-07 MED ORDER — KETOROLAC TROMETHAMINE 30 MG/ML IJ SOLN: 30.0000 mg | Freq: Once | INTRAMUSCULAR | Status: DC

## 2014-03-07 MED ORDER — ASPIRIN EC 81 MG PO TBEC
81.0000 mg | DELAYED_RELEASE_TABLET | Freq: Every day | ORAL | Status: DC
  Administered 2014-03-08 – 2014-03-09 (×2): 81 mg via ORAL
  Filled 2014-03-07 (×2): qty 1

## 2014-03-07 MED ORDER — ONDANSETRON HCL 4 MG/2ML IJ SOLN: 4.0000 mg | Freq: Four times a day (QID) | INTRAMUSCULAR | Status: DC | PRN

## 2014-03-07 MED ORDER — WARFARIN SODIUM 4 MG PO TABS
4.0000 mg | ORAL_TABLET | Freq: Once | ORAL | Status: DC
  Filled 2014-03-07: qty 1

## 2014-03-07 MED ORDER — ALBUTEROL SULFATE (2.5 MG/3ML) 0.083% IN NEBU
5.0000 mg | INHALATION_SOLUTION | Freq: Once | RESPIRATORY_TRACT | Status: AC
  Administered 2014-03-07: 5 mg via RESPIRATORY_TRACT
  Filled 2014-03-07: qty 6

## 2014-03-07 MED ORDER — INSULIN ASPART 100 UNIT/ML IV SOLN
8.0000 [IU] | Freq: Once | INTRAVENOUS | Status: AC
  Administered 2014-03-07: 8 [IU] via INTRAVENOUS
  Filled 2014-03-07 (×2): qty 1

## 2014-03-07 MED ORDER — LEVOTHYROXINE SODIUM 150 MCG PO TABS
150.0000 ug | ORAL_TABLET | Freq: Every day | ORAL | Status: DC
  Administered 2014-03-08 – 2014-03-09 (×2): 150 ug via ORAL
  Filled 2014-03-07 (×4): qty 1

## 2014-03-07 MED ORDER — SODIUM CHLORIDE 0.9 % IV SOLN
1.0000 g | Freq: Once | INTRAVENOUS | Status: AC
  Administered 2014-03-07: 1 g via INTRAVENOUS
  Filled 2014-03-07: qty 10

## 2014-03-07 MED ORDER — RENA-VITE PO TABS
1.0000 | ORAL_TABLET | Freq: Every day | ORAL | Status: DC
  Administered 2014-03-07 – 2014-03-08 (×2): 1 via ORAL
  Filled 2014-03-07 (×3): qty 1

## 2014-03-07 MED ORDER — DEXTROSE 50 % IV SOLN
50.0000 mL | Freq: Once | INTRAVENOUS | Status: AC
  Administered 2014-03-07: 50 mL via INTRAVENOUS
  Filled 2014-03-07: qty 50

## 2014-03-07 MED ORDER — TACROLIMUS 1 MG PO CAPS
3.0000 mg | ORAL_CAPSULE | Freq: Two times a day (BID) | ORAL | Status: DC
  Administered 2014-03-07 – 2014-03-09 (×4): 3 mg via ORAL
  Filled 2014-03-07 (×6): qty 3

## 2014-03-07 MED ORDER — ONDANSETRON HCL 4 MG PO TABS: 4.0000 mg | ORAL_TABLET | Freq: Four times a day (QID) | ORAL | Status: DC | PRN

## 2014-03-07 MED ORDER — INSULIN ASPART 100 UNIT/ML ~~LOC~~ SOLN
0.0000 [IU] | Freq: Three times a day (TID) | SUBCUTANEOUS | Status: DC
  Administered 2014-03-08: 1 [IU] via SUBCUTANEOUS
  Administered 2014-03-08 – 2014-03-09 (×2): 2 [IU] via SUBCUTANEOUS

## 2014-03-07 MED ORDER — METOPROLOL TARTRATE 50 MG PO TABS
50.0000 mg | ORAL_TABLET | Freq: Two times a day (BID) | ORAL | Status: DC
  Administered 2014-03-08 – 2014-03-09 (×3): 50 mg via ORAL
  Filled 2014-03-07 (×6): qty 1

## 2014-03-07 MED ORDER — CINACALCET HCL 30 MG PO TABS
30.0000 mg | ORAL_TABLET | Freq: Every day | ORAL | Status: DC
  Administered 2014-03-07 – 2014-03-09 (×3): 30 mg via ORAL
  Filled 2014-03-07 (×4): qty 1

## 2014-03-07 MED ORDER — LORAZEPAM 0.5 MG PO TABS: 0.5000 mg | ORAL_TABLET | Freq: Every evening | ORAL | Status: DC | PRN

## 2014-03-07 MED ORDER — CALCIUM ACETATE 667 MG PO CAPS
1334.0000 mg | ORAL_CAPSULE | Freq: Three times a day (TID) | ORAL | Status: DC
  Administered 2014-03-08 – 2014-03-09 (×4): 1334 mg via ORAL
  Filled 2014-03-07 (×6): qty 2

## 2014-03-07 MED ORDER — AZITHROMYCIN 500 MG PO TABS
500.0000 mg | ORAL_TABLET | Freq: Every day | ORAL | Status: DC
  Administered 2014-03-07 – 2014-03-09 (×3): 500 mg via ORAL
  Filled 2014-03-07 (×3): qty 1

## 2014-03-07 MED ORDER — GABAPENTIN 300 MG PO CAPS
300.0000 mg | ORAL_CAPSULE | Freq: Every day | ORAL | Status: DC
  Administered 2014-03-07 – 2014-03-08 (×2): 300 mg via ORAL
  Filled 2014-03-07 (×3): qty 1

## 2014-03-07 NOTE — Progress Notes (Signed)
ANTICOAGULATION CONSULT NOTE - Initial Consult  Pharmacy Consult for Warfarin Indication: Mechanical AVR  Allergies  Allergen Reactions  . Acetaminophen Other (See Comments)    Liver transplant recipient   . Codeine Itching  . Mirtazapine Other (See Comments)    hallucination  . Morphine Itching    Patient Measurements: Height: 5' 4.5" (163.8 cm) Weight: 178 lb 9.2 oz (81 kg) IBW/kg (Calculated) : 55.85  Vital Signs: Temp: 98.3 F (36.8 C) (01/26 0735) Temp Source: Oral (01/26 0735) BP: 148/59 mmHg (01/26 1245) Pulse Rate: 65 (01/26 1245)  Labs:  Recent Labs  03/07/14 0816  HGB 11.5*  HCT 36.0  PLT PLATELET CLUMPS NOTED ON SMEAR, COUNT APPEARS DECREASED  LABPROT 28.5*  INR 2.65*  CREATININE 11.16*    Estimated Creatinine Clearance: 5.6 mL/min (by C-G formula based on Cr of 11.16).   Medical History: Past Medical History  Diagnosis Date  . 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 Chronic back pain   . CAD (coronary artery disease)   . Obesity   . Peripheral vascular disease hands and legs  . Anxiety   . Asthma   . GERD (gastroesophageal reflux disease)     takes Omeprazole daily  . Chronic constipation     takes MIralax and Colace daily  . Anemia     takes Folic Acid daily  . Hypothyroidism     takes Synthroid daily  . Depression     takes Cymbalta for "severe" depression  . Neuromuscular disorder     carpal tunnel in right hand  . Hypertension     takes Metoprolol and Lisinopril daily, sees Dr Bea Graff  . COPD (chronic obstructive pulmonary disease)   . Pneumonia     "today and several times before" (08/30/2012)  . Chronic bronchitis     "q yr w/season changes" (08/30/2012)  . Type II diabetes mellitus     Levemir 2units daily if > 150  . History of blood transfusion     "several" (08/30/2012)  . Hepatitis C   . Migraine     "last migraine was in 2013" (08/30/2012)  .  Headache     "at least monthly" (08/30/2012)  . Arthritis     "left hand, back" (08/30/2012)  . End stage renal disease on dialysis 02/27/2011    Kidneys shut down at time of liver transplant in Sept 2011 at [facility] in [place], she has been on HD ever since.  Dialyzes at [facility] HD on TTS schedule.  Had L forearm graft used 10 months then removed Dec 2012 due to suspected infection.  A right upper arm AVG was placed Dec 2012 but she developed steal symptoms acutely and it was ligated the same day.  Never had an AV fistula due to small veins.  Now has L thigh AVG put in Jan 2013, has not clotted to date.    [name] CAD (coronary artery disease) Jan. 2015    Cath: 20% LAD, 50% D1; s/p LIMA-LAD  . Aortic stenosis   . Tobacco abuse   . S/P aortic valve replacement 03/15/13    Mechanical     Assessment: 41 YOF with ESRD on warfarin PTA for hx mechanical AVR - pharmacy consulted to resume this admission. Admit INR 2.65 and therapeutic on PTA dose of 4 mg daily - the patient's last reported dose was stated to be on 1/25.  Goal of Therapy:  INR 2.5-3 (per anticoag clinic note)   Plan:  1. Warfarin 4 mg x 1 dose at 1800 today 2. Daily PT/INR 3. Will continue to monitor for any signs/symptoms of bleeding and will follow up with PT/INR in the a.m.   Alycia Rossetti, PharmD, BCPS Clinical Pharmacist Pager: [phone] 03/07/2014 6:08 PM

## 2014-03-07 NOTE — ED Notes (Signed)
Pt. Was at her dialysis center this am and reported to The RN, that she was having chest pain and sob since yesterday.. Pain had been constant, described as a pressure non -radiating.  Pt. Was not able to have her dialysis treatment this am.  Center reports that she has possibly 6 liters of fluid this am on board.  She did have her dialysis treatment on Saturday and they removed 4.3L of fluild.  Pt. Denies any n/v d.  Skin is warm and dry.  Resp. Are labored and between 28 - 34. Pt. Is alert and oriented X4

## 2014-03-07 NOTE — H&P (Addendum)
[facility] History and Physical  WARDA MCQUEARY HQP:[id number] DOB: Nov 27, 1955 DOA: 03/07/2014  Referring physician: EDP PCP: No primary care provider on file.   Chief Complaint: sob and chest discomfort since yesterday.   HPI: [name] is a 59 y.o. female with prior h/o CAD s/p CABG, aortic stenosis and s/p aortic valve replacement, ESRD on HD  TTS, Diabetes mellitus, hypertension,  Chronic diastolic heart failure,  hepatitic C on chronic therapy, comes in for shortness of breath and chest pressure, sub sternally, non radiating. She denies fevers or chills. Her last HD was Saturday. She reports productive cough. She denies any nausea, vomiting and abdominal pain. She went to her HD today, but her bp was elevated and she was dyspneic and was brought to ED. On arrival to ED, her labs reveal elevated potassium level of 6.2. Her CXR was unremarkable. She was referred to admission to medical services for further evaluation.   Review of Systems:   See HPI for pertinent positive history. Otherwise negative.   Past Medical History  Diagnosis Date  . 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 Chronic back pain   . CAD (coronary artery disease)   . Obesity   . Peripheral vascular disease hands and legs  . Anxiety   . Asthma   . GERD (gastroesophageal reflux disease)     takes Omeprazole daily  . Chronic constipation     takes MIralax and Colace daily  . Anemia     takes Folic Acid daily  . Hypothyroidism     takes Synthroid daily  . Depression     takes Cymbalta for "severe" depression  . Neuromuscular disorder     carpal tunnel in right hand  . Hypertension     takes Metoprolol and Lisinopril daily, sees Dr Bea Graff  . COPD (chronic obstructive pulmonary disease)   . Pneumonia     "today and several times before" (08/30/2012)  . Chronic bronchitis     "q yr w/season changes" (08/30/2012)  . Type II  diabetes mellitus     Levemir 2units daily if > 150  . History of blood transfusion     "several" (08/30/2012)  . Hepatitis C   . Migraine     "last migraine was in 2013" (08/30/2012)  . Headache     "at least monthly" (08/30/2012)  . Arthritis     "left hand, back" (08/30/2012)  . End stage renal disease on dialysis 02/27/2011    Kidneys shut down at time of liver transplant in Sept 2011 at [facility] in [place], she has been on HD ever since.  Dialyzes at [facility] HD on TTS schedule.  Had L forearm graft used 10 months then removed Dec 2012 due to suspected infection.  A right upper arm AVG was placed Dec 2012 but she developed steal symptoms acutely and it was ligated the same day.  Never had an AV fistula due to small veins.  Now has L thigh AVG put in Jan 2013, has not clotted to date.    [name] CAD (coronary artery disease) Jan. 2015    Cath: 20% LAD, 50% D1; s/p LIMA-LAD  . Aortic stenosis   . Tobacco abuse   . S/P aortic valve replacement 03/15/13    Mechanical    Past Surgical History  Procedure Laterality Date  . Liver transplant  10/25/2009  sees Dr Ferol Luz 1 every 6 months, saw last in Dec 2013. [name] Coord [phone]  . Small intestine surgery  90's  . Thrombectomy    . Arteriovenous graft placement Left 10/03/10     forearm  . Avgg removal  12/23/2010    Procedure: REMOVAL OF ARTERIOVENOUS GORETEX GRAFT (B[place]ic);  Surgeon: Elam Dutch, MD;  Location: [place];  Service: Vascular;  Laterality: Left;  procedure started @1736 -1852  . Insertion of dialysis catheter  12/23/2010    Procedure: INSERTION OF DIALYSIS CATHETER;  Surgeon: Elam Dutch, MD;  Location: [place];  Service: Vascular;  Laterality: Right;  Right Internal Jugular 28cm dialysis catheter insertion procedure time 1701-1720   . Cholecystectomy  1993  . Cystoscopy  1990's  . Spinal growth rods  2010    "put 2 metal rods in my back; they had detetriorated" (08/30/2012)  . Av fistula placement  01/29/2011     Procedure: INSERTION OF ARTERIOVENOUS (AV) GORE-TEX GRAFT ARM;  Surgeon: Elam Dutch, MD;  Location: [place];  Service: Vascular;  Laterality: Right;  . Av fistula placement  03/10/2011    Procedure: INSERTION OF ARTERIOVENOUS (AV) GORE-TEX GRAFT THIGH;  Surgeon: Elam Dutch, MD;  Location: [place];  Service: Vascular;  Laterality: Left;  . Tubal ligation  1990's  . Cardiac catheterization  2014  . Aortic valve replacement N/A 03/15/2013    AVR; Surgeon: Ivin Poot, MD;  Location: [facility] OR; Open Heart Surgery;  24mm[employer] mechanical prosthesis, top hat valve  . Coronary artery bypass graft N/A 03/15/2013    Procedure: CORONARY ARTERY BYPASS GRAFTING (CABG) times one using left internal mammary artery.;  Surgeon: Ivin Poot, MD;  Location: [place];  Service: Open Heart Surgery;  Laterality: N/A;  POSS CABG X 1  . Intraoperative transesophageal echocardiogram N/A 03/15/2013    Procedure: INTRAOPERATIVE TRANSESOPHAGEAL ECHOCARDIOGRAM;  Surgeon: Ivin Poot, MD;  Location: [place];  Service: Open Heart Surgery;  Laterality: N/A;  . Left heart catheterization with coronary/graft angiogram N/A 12/24/2011    Procedure: LEFT HEART CATHETERIZATION WITH Beatrix Fetters;  Surgeon: [name], MD;  Location: [facility] CATH LAB;  Service: Cardiovascular;  Laterality: N/A;  . Left heart catheterization with coronary angiogram N/A 07/29/2012    Procedure: LEFT HEART CATHETERIZATION WITH CORONARY ANGIOGRAM;  Surgeon: Troy Sine, MD;  Location: [facility] CATH LAB;  Service: Cardiovascular;  Laterality: N/A;  . Left heart catheterization with coronary angiogram N/A 03/10/2013    Procedure: LEFT HEART CATHETERIZATION WITH CORONARY ANGIOGRAM;  Surgeon: Troy Sine, MD;  Location: [facility] C[facility] LAB;  Service: Cardiovascular;  Laterality: N/A;  . Left heart catheterization with coronary/graft angiogram N/A 12/16/2013    Procedure: LEFT HEART CATHETERIZATION WITH Beatrix Fetters;  Surgeon: Troy Sine, MD;  Location: [facility] CATH LAB;  Service: Cardiovascular;  Laterality: N/A;   Social History:  reports that she has been smoking Cigarettes.  She has a 30 pack-year smoking history. She has never used smokeless tobacco. She reports that she drinks alcohol. She reports that she does not use illicit drugs.  Allergies  Allergen Reactions  . Acetaminophen Other (See Comments)    Liver transplant recipient   . Codeine Itching  . Mirtazapine Other (See Comments)    hallucination  . Morphine Itching    Family History  Problem Relation Age of Onset  . Cancer Mother   . Diabetes Mother   . Hypertension Mother   . Stroke Mother   .  Cancer Father   . Anesthesia problems Neg Hx   . Hypotension Neg Hx   . Malignant hyperthermia Neg Hx   . Pseudochol deficiency Neg Hx       Prior to Admission medications   Medication Sig Start Date End Date Taking? Authorizing Provider  albuterol (PROVENTIL,VENTOLIN) 90 MCG/ACT inhaler Inhale 1-2 puffs into the lungs every 6 (six) hours as needed for wheezing or shortness of breath.    Yes Historical Provider, MD  aspirin EC 81 MG tablet Take 81 mg by mouth daily.     Yes Historical Provider, MD  calcium acetate (PHOSLO) 667 MG capsule Take 2,001 mg by mouth 3 (three) times daily with meals.   Yes Historical Provider, MD  calcium carbonate (TUMS - DOSED IN MG ELEMENTAL CALCIUM) 500 MG chewable tablet Chew 2 tablets by mouth daily as needed for indigestion or heartburn.    Yes Historical Provider, MD  cinacalcet (SENSIPAR) 30 MG tablet Take 30 mg by mouth daily.   Yes Historical Provider, MD  gabapentin (NEURONTIN) 300 MG capsule Take 300 mg by mouth at bedtime.    Yes Historical Provider, MD  insulin detemir (LEVEMIR) 100 UNIT/ML injection Inject 1-2 Units into the skin daily as needed (blood). 150-200 do 2 units >200 do 5 units   Yes Historical Provider, MD  levothyroxine (SYNTHROID, LEVOTHROID) 150 MCG tablet Take 150 mcg by mouth daily before breakfast.    Yes Historical Provider, MD  LORazepam (ATIVAN) 0.5 MG tablet Take 0.5 mg by mouth at bedtime as needed for sleep.  02/08/13  Yes Historical Provider, MD  metoprolol (LOPRESSOR) 50 MG tablet On non dialysis days take 1 & 1/2 tablet in the morning and 1 tablet in the evening. On dialysis days take 1 tablet twice daily Patient taking differently: Take 50 mg by mouth 2 (two) times daily.  12/28/13  Yes Troy Sine, MD  Multiple Vitamin (MULTIVITAMIN WITH MINERALS) TABS tablet Take 1 tablet by mouth at bedtime.    Yes Historical Provider, MD  ondansetron (ZOFRAN-ODT) 4 MG disintegrating tablet Take 4 mg by mouth every 8 (eight) hours as needed for nausea or vomiting.   Yes Historical Provider, MD  polyethylene glycol (MIRALAX / GLYCOLAX) packet Take 17 g by mouth daily as needed for mild constipation.    Yes Historical Provider, MD  promethazine (PHENERGAN) 12.5 MG tablet Take 12.5 mg by mouth every 4 (four) hours as needed for nausea.  02/09/13  Yes Historical Provider, MD  tacrolimus (PROGRAF) 1 MG capsule Take 3 mg by mouth 2 (two) times daily.    Yes Historical Provider, MD  traMADol (ULTRAM) 50 MG tablet Take 50 mg by mouth every 12 (twelve) hours as needed for moderate pain.  02/09/13  Yes Historical Provider, MD  warfarin (COUMADIN) 4 MG tablet Take 1 tablet (4 mg total) by mouth daily. 02/24/14  Yes Osa Craver, MD   Physical Exam: Filed Vitals:   03/07/14 0735 03/07/14 0930 03/07/14 0945  BP: 177/61 155/59 171/67  Pulse: 71 66 69  Temp: 98.3 F (36.8 C)    TempSrc: Oral    Resp: 2 19 17   Height: 5' 4.5" (1.638 m)    Weight: 81 kg (178 lb 9.2 oz)    SpO2: 96% 99% 100%    Wt Readings from Last 3 Encounters:  03/07/14 81 kg (178 lb 9.2 oz)  02/24/14 76.885 kg (169 lb 8 oz)  01/04/14 76.1 kg (167 lb 12.3 oz)    General:  Appears  calm and comfortable Eyes: PERRL, normal lids, irises & conjunctiva ENT: grossly normal hearing, lips & tongue Neck: no LAD, masses or  thyromegaly Cardiovascular: RRR, no m/r/g. No LE edema. Telemetry: SR, no arrhythmias  Respiratory: CTA bilaterally, no w/r/r. Normal respiratory effort. Abdomen: soft, ntnd Skin: no rash or induration seen on limited exam Musculoskeletal: grossly normal tone BUE/BLE Psychiatric: grossly normal mood and affect, speech fluent and appropriate Neurologic: grossly non-focal.          Labs on Admission:  Basic Metabolic Panel:  Recent Labs Lab 03/07/14 0816  NA 137  K 6.0*  CL 99  CO2 24  GLUCOSE 137*  BUN 46*  CREATININE 11.16*  CALCIUM 7.7*   Liver Function Tests:  Recent Labs Lab 03/07/14 0816  AST 42*  ALT 35  ALKPHOS 88  BILITOT 0.9  PROT 7.6  ALBUMIN 3.1*   No results for input(s): LIPASE, AMYLASE in the last 168 hours. No results for input(s): AMMONIA in the last 168 hours. CBC:  Recent Labs Lab 03/07/14 0816  WBC 5.8  HGB 11.5*  HCT 36.0  MCV 97.8  PLT PLATELET CLUMPS NOTED ON SMEAR, COUNT APPEARS DECREASED   Cardiac Enzymes: No results for input(s): CKTOTAL, CKMB, CKMBINDEX, TROPONINI in the last 168 hours.  BNP (last 3 results)  Recent Labs  01/02/14 2318  PROBNP 33098.0*   CBG: No results for input(s): GLUCAP in the last 168 hours.  Radiological Exams on Admission: Dg Chest Portable 1 View  03/07/2014   CLINICAL DATA:  Acute chest pain since yesterday.  EXAM: PORTABLE CHEST - 1 VIEW  COMPARISON:  February 19, 2014.  FINDINGS: Stable cardiomediastinal silhouette. Sternotomy wires are noted. No pneumothorax or significant pleural effusion is noted. No acute pulmonary disease is noted. Bony thorax appears intact.  IMPRESSION: No acute cardiopulmonary abnormality seen.   Electronically Signed   By: Sabino Dick M.D.   On: 03/07/2014 08:15    EKG: sinus rhythm  Assessment/Plan Active Problems:   Hyperkalemia   Dyspnea  Dyspnea / chest discomfort: probably secondary to fluid overload. Admit to telemetry. Cardiac enzymes and she had a recent  echocardiogram which showed LVEF of 55% and grade 2 diastolic dysfunction.  CXR does not show any pleural effusion.  Plan for HD today.    ESRD on HD TTS: Further management as per renal.    Hyperkalemia: Insulin and glucose given in ED and calcium gluconate given in ED.  Repeat level tonight.   Hypertension: Better controlled.   CAD: s/p CABG; Chest pain improved, serial troponins and resume aspirin.    AVR: Resume coumadin as per pharmacy.  iNR therapeutic.    Diabetes Mellitus: SSI.   Code Status: full code.  DVT Prophylaxis: Family Communication: none at bedside Disposition Plan: pending   Time spent: 55 minutes.   [facility] Pager [phone]

## 2014-03-07 NOTE — ED Provider Notes (Signed)
CSN: [id number]     Arrival date & time 03/07/14  0720 History   First MD Initiated Contact with Patient 03/07/14 [phone]     Chief Complaint  Patient presents with  . Chest Pain     (Consider location/radiation/quality/duration/timing/severity/associated sxs/prior Treatment) HPI Comments: 59 year old female with history of hepatitis C, liver disease, high blood pressure, end-stage renal disease on dialysis last Saturday and unable to dialyze today due to chest pain and shortness of rash, Eric stenosis, valve replacement systolic heart failure COPD, tobacco abuse presents with shortness of breath and chest pain worsening for last 24 hours. Exertional sob worsens.  Sxs constant overall. Similar to previous but more severe. Unknown wt gain.  Patient is a 59 y.o. female presenting with chest pain. The history is provided by the patient.  Chest Pain Associated symptoms: fatigue and shortness of breath   Associated symptoms: no abdominal pain, no back pain, no fever, no headache and not vomiting     Past Medical History  Diagnosis Date  . 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 Chronic back pain   . CAD (coronary artery disease)   . Obesity   . Peripheral vascular disease hands and legs  . Anxiety   . Asthma   . GERD (gastroesophageal reflux disease)     takes Omeprazole daily  . Chronic constipation     takes MIralax and Colace daily  . Anemia     takes Folic Acid daily  . Hypothyroidism     takes Synthroid daily  . Depression     takes Cymbalta for "severe" depression  . Neuromuscular disorder     carpal tunnel in right hand  . Hypertension     takes Metoprolol and Lisinopril daily, sees Dr Bea Graff  . COPD (chronic obstructive pulmonary disease)   . Pneumonia     "today and several times before" (08/30/2012)  . Chronic bronchitis     "q yr w/season changes" (08/30/2012)  . Type II diabetes mellitus      Levemir 2units daily if > 150  . History of blood transfusion     "several" (08/30/2012)  . Hepatitis C   . Migraine     "last migraine was in 2013" (08/30/2012)  . Headache     "at least monthly" (08/30/2012)  . Arthritis     "left hand, back" (08/30/2012)  . End stage renal disease on dialysis 02/27/2011    Kidneys shut down at time of liver transplant in Sept 2011 at [facility] in [place], she has been on HD ever since.  Dialyzes at [facility] HD on TTS schedule.  Had L forearm graft used 10 months then removed Dec 2012 due to suspected infection.  A right upper arm AVG was placed Dec 2012 but she developed steal symptoms acutely and it was ligated the same day.  Never had an AV fistula due to small veins.  Now has L thigh AVG put in Jan 2013, has not clotted to date.    [name] CAD (coronary artery disease) Jan. 2015    Cath: 20% LAD, 50% D1; s/p LIMA-LAD  . Aortic stenosis   . Tobacco abuse   . S/P aortic valve replacement 03/15/13    Mechanical    Past Surgical History  Procedure Laterality Date  . Liver transplant  10/25/2009    sees Dr Ferol Luz 1 every 6 months, saw last  in Dec 2013. [name] Coord [phone]  . Small intestine surgery  90's  . Thrombectomy    . Arteriovenous graft placement Left 10/03/10     forearm  . Avgg removal  12/23/2010    Procedure: REMOVAL OF ARTERIOVENOUS GORETEX GRAFT ([place]);  Surgeon: Elam Dutch, MD;  Location: [place];  Service: Vascular;  Laterality: Left;  procedure started @1736 -6967  . Insertion of dialysis catheter  12/23/2010    Procedure: INSERTION OF DIALYSIS CATHETER;  Surgeon: Elam Dutch, MD;  Location: [place];  Service: Vascular;  Laterality: Right;  Right Internal Jugular 28cm dialysis catheter insertion procedure time 1701-1720   . Cholecystectomy  1993  . Cystoscopy  1990's  . Spinal growth rods  2010    "put 2 metal rods in my back; they had detetriorated" (08/30/2012)  . Av fistula placement  01/29/2011    Procedure: INSERTION OF  ARTERIOVENOUS (AV) GORE-TEX GRAFT ARM;  Surgeon: Elam Dutch, MD;  Location: [place];  Service: Vascular;  Laterality: Right;  . Av fistula placement  03/10/2011    Procedure: INSERTION OF ARTERIOVENOUS (AV) GORE-TEX GRAFT THIGH;  Surgeon: Elam Dutch, MD;  Location: [place];  Service: Vascular;  Laterality: Left;  . Tubal ligation  1990's  . Cardiac catheterization  2014  . Aortic valve replacement N/A 03/15/2013    AVR; Surgeon: Ivin Poot, MD;  Location: [facility] OR; Open Heart Surgery;  33mm[employer] mechanical prosthesis, top hat valve  . Coronary artery bypass graft N/A 03/15/2013    Procedure: CORONARY ARTERY BYPASS GRAFTING (CABG) times one using left internal mammary artery.;  Surgeon: Ivin Poot, MD;  Location: [place];  Service: Open Heart Surgery;  Laterality: N/A;  POSS CABG X 1  . Intraoperative transesophageal echocardiogram N/A 03/15/2013    Procedure: INTRAOPERATIVE TRANSESOPHAGEAL ECHOCARDIOGRAM;  Surgeon: Ivin Poot, MD;  Location: [place];  Service: Open Heart Surgery;  Laterality: N/A;  . Left heart catheterization with coronary/graft angiogram N/A 12/24/2011    Procedure: LEFT HEART CATHETERIZATION WITH Beatrix Fetters;  Surgeon: [name], MD;  Location: [facility] CATH LAB;  Service: Cardiovascular;  Laterality: N/A;  . Left heart catheterization with coronary angiogram N/A 07/29/2012    Procedure: LEFT HEART CATHETERIZATION WITH CORONARY ANGIOGRAM;  Surgeon: Troy Sine, MD;  Location: [facility] CATH LAB;  Service: Cardiovascular;  Laterality: N/A;  . Left heart catheterization with coronary angiogram N/A 03/10/2013    Procedure: LEFT HEART CATHETERIZATION WITH CORONARY ANGIOGRAM;  Surgeon: Troy Sine, MD;  Location: [facility] CATH LAB;  Service: Cardiovascular;  Laterality: N/A;  . Left heart catheterization with coronary/graft angiogram N/A 12/16/2013    Procedure: LEFT HEART CATHETERIZATION WITH Beatrix Fetters;  Surgeon: Troy Sine, MD;  Location: [facility]  CATH LAB;  Service: Cardiovascular;  Laterality: N/A;   Family History  Problem Relation Age of Onset  . Cancer Mother   . Diabetes Mother   . Hypertension Mother   . Stroke Mother   . Cancer Father   . Anesthesia problems Neg Hx   . Hypotension Neg Hx   . Malignant hyperthermia Neg Hx   . Pseudochol deficiency Neg Hx    History  Substance Use Topics  . Smoking status: Current Some Day Smoker -- 0.75 packs/day for 40 years    Types: Cigarettes    Last Attempt to Quit: 03/13/2013  . Smokeless tobacco: Never Used     Comment: still smokng 1 or 2 a day  . Alcohol Use: Yes  Comment: 08/30/2012 "last drink was at a wedding July, 2014; had a small glass of wine; never had problems w/alcohol"   OB History    No data available     Review of Systems  Constitutional: Positive for fatigue. Negative for fever and chills.  HENT: Negative for congestion.   Eyes: Negative for visual disturbance.  Respiratory: Positive for shortness of breath.   Cardiovascular: Positive for chest pain.  Gastrointestinal: Negative for vomiting and abdominal pain.  Genitourinary: Negative for dysuria and flank pain.  Musculoskeletal: Negative for back pain, neck pain and neck stiffness.  Skin: Negative for rash.  Neurological: Negative for light-headedness and headaches.      Allergies  Acetaminophen; Codeine; Mirtazapine; and Morphine  Home Medications   Prior to Admission medications   Medication Sig Start Date End Date Taking? Authorizing Provider  albuterol (PROVENTIL,VENTOLIN) 90 MCG/ACT inhaler Inhale 1-2 puffs into the lungs every 6 (six) hours as needed for wheezing or shortness of breath.    Yes Historical Provider, MD  aspirin EC 81 MG tablet Take 81 mg by mouth daily.     Yes Historical Provider, MD  calcium acetate (PHOSLO) 667 MG capsule Take 2,001 mg by mouth 3 (three) times daily with meals.   Yes Historical Provider, MD  calcium carbonate (TUMS - DOSED IN MG ELEMENTAL CALCIUM) 500  MG chewable tablet Chew 2 tablets by mouth daily as needed for indigestion or heartburn.    Yes Historical Provider, MD  cinacalcet (SENSIPAR) 30 MG tablet Take 30 mg by mouth daily.   Yes Historical Provider, MD  gabapentin (NEURONTIN) 300 MG capsule Take 300 mg by mouth at bedtime.    Yes Historical Provider, MD  insulin detemir (LEVEMIR) 100 UNIT/ML injection Inject 1-2 Units into the skin daily as needed (blood). 150-200 do 2 units >200 do 5 units   Yes Historical Provider, MD  levothyroxine (SYNTHROID, LEVOTHROID) 150 MCG tablet Take 150 mcg by mouth daily before breakfast.   Yes Historical Provider, MD  LORazepam (ATIVAN) 0.5 MG tablet Take 0.5 mg by mouth at bedtime as needed for sleep.  02/08/13  Yes Historical Provider, MD  metoprolol (LOPRESSOR) 50 MG tablet On non dialysis days take 1 & 1/2 tablet in the morning and 1 tablet in the evening. On dialysis days take 1 tablet twice daily Patient taking differently: Take 50 mg by mouth 2 (two) times daily.  12/28/13  Yes Troy Sine, MD  Multiple Vitamin (MULTIVITAMIN WITH MINERALS) TABS tablet Take 1 tablet by mouth at bedtime.    Yes Historical Provider, MD  ondansetron (ZOFRAN-ODT) 4 MG disintegrating tablet Take 4 mg by mouth every 8 (eight) hours as needed for nausea or vomiting.   Yes Historical Provider, MD  polyethylene glycol (MIRALAX / GLYCOLAX) packet Take 17 g by mouth daily as needed for mild constipation.    Yes Historical Provider, MD  promethazine (PHENERGAN) 12.5 MG tablet Take 12.5 mg by mouth every 4 (four) hours as needed for nausea.  02/09/13  Yes Historical Provider, MD  tacrolimus (PROGRAF) 1 MG capsule Take 3 mg by mouth 2 (two) times daily.    Yes Historical Provider, MD  traMADol (ULTRAM) 50 MG tablet Take 50 mg by mouth every 12 (twelve) hours as needed for moderate pain.  02/09/13  Yes Historical Provider, MD  warfarin (COUMADIN) 4 MG tablet Take 1 tablet (4 mg total) by mouth daily. 02/24/14  Yes Alexa Marvel Plan,  MD   BP 172/70 mmHg  Pulse 99  Temp(Src) 98.3 F (36.8 C) (Oral)  Resp 16  Ht 5' 4.5" (1.638 m)  Wt 179 lb 10.8 oz (81.5 kg)  BMI 30.38 kg/m2  SpO2 92% Physical Exam  Constitutional: She is oriented to person, place, and time. She appears well-developed and well-nourished.  HENT:  Head: Normocephalic and atraumatic.  Eyes: Right eye exhibits no discharge. Left eye exhibits no discharge.  Neck: Normal range of motion. Neck supple. No tracheal deviation present.  Cardiovascular: Normal rate and regular rhythm.   Pulmonary/Chest: She has rales (crackles baseline, mild increased effort).  Abdominal: Soft. She exhibits no distension. There is no tenderness. There is no guarding.  Musculoskeletal: She exhibits edema (mild LE bilateral).  Neurological: She is alert and oriented to person, place, and time.  Skin: Skin is warm. No rash noted.  Psychiatric: She has a normal mood and affect.  Nursing note and vitals reviewed.   ED Course  Procedures (including critical care time) Labs Review Labs Reviewed  CBC - Abnormal; Notable for the following:    RBC 3.68 (*)    Hemoglobin 11.5 (*)    All other components within normal limits  COMPREHENSIVE METABOLIC PANEL - Abnormal; Notable for the following:    Potassium 6.0 (*)    Glucose, Bld 137 (*)    BUN 46 (*)    Creatinine, Ser 11.16 (*)    Calcium 7.7 (*)    Albumin 3.1 (*)    AST 42 (*)    GFR calc non Af Amer 3 (*)    GFR calc Af Amer 4 (*)    All other components within normal limits  PROTIME-INR - Abnormal; Notable for the following:    Prothrombin Time 28.5 (*)    INR 2.65 (*)    All other components within normal limits  BRAIN NATRIURETIC PEPTIDE - Abnormal; Notable for the following:    B Natriuretic Peptide 1902.3 (*)    All other components within normal limits  POTASSIUM - Abnormal; Notable for the following:    Potassium 6.3 (*)    All other components within normal limits  GLUCOSE, CAPILLARY  TROPONIN I   TROPONIN I  TROPONIN I  BASIC METABOLIC PANEL  CBC  BASIC METABOLIC PANEL  POTASSIUM  I-STAT TROPOININ, ED    Imaging Review Dg Chest Portable 1 View  03/07/2014   CLINICAL DATA:  Acute chest pain since yesterday.  EXAM: PORTABLE CHEST - 1 VIEW  COMPARISON:  February 19, 2014.  FINDINGS: Stable cardiomediastinal silhouette. Sternotomy wires are noted. No pneumothorax or significant pleural effusion is noted. No acute pulmonary disease is noted. Bony thorax appears intact.  IMPRESSION: No acute cardiopulmonary abnormality seen.   Electronically Signed   By: Sabino Dick M.D.   On: 03/07/2014 08:15     EKG Interpretation   Date/Time:  Tuesday March 07 2014 09:48:03 EST Ventricular Rate:  67 PR Interval:  219 QRS Duration: 90 QT Interval:  471 QTC Calculation: 497 R Axis:   36 Text Interpretation:  Sinus rhythm Prolonged PR interval Probable LVH with  secondary repol abnrm Anterior Q waves, possibly due to LVH Confirmed by  Jaki Hammerschmidt  MD, Gearold Wainer ([id number]) on 03/07/2014 10:00:31 AM      MDM   Final diagnoses:  Acute pulmonary edema  End-stage renal disease needing dialysis  Acute hyperkalemia  Acute dyspnea  Chest pain, unspecified chest pain type   Patient with, located heart, liver and kidney disease presents with worsening shortness of breath with exertion the past 24  hours. Clinically pulmonary edema, patient requiring 1-2 L oxygen. Potassium mild elevated, hyperkalemia medicines given, repeat sent. [facility]. Paged nephrology for dialysis.  The patients results and plan were reviewed and discussed.   Any x-rays performed were personally reviewed by myself.   Differential diagnosis were considered with the presenting HPI.  Medications  tacrolimus (PROGRAF) capsule 3 mg (0 mg Oral Hold 03/07/14 1400)  cinacalcet (SENSIPAR) tablet 30 mg (not administered)  gabapentin (NEURONTIN) capsule 300 mg (not administered)  levothyroxine (SYNTHROID,  LEVOTHROID) tablet 150 mcg (not administered)  aspirin EC tablet 81 mg (not administered)  LORazepam (ATIVAN) tablet 0.5 mg (not administered)  metoprolol (LOPRESSOR) tablet 50 mg (50 mg Oral Not Given 03/07/14 1631)  ondansetron (ZOFRAN) tablet 4 mg (not administered)    Or  ondansetron (ZOFRAN) injection 4 mg (not administered)  calcium acetate (PHOSLO) capsule 1,334 mg (not administered)  multivitamin (RENA-VIT) tablet 1 tablet (not administered)  calcitRIOL (ROCALTROL) capsule 0.5 mcg (not administered)  insulin aspart (novoLOG) injection 0-9 Units (not administered)  azithromycin (ZITHROMAX) tablet 500 mg (not administered)  calcium gluconate 1 g in sodium chloride 0.9 % 100 mL IVPB (0 g Intravenous Stopped 03/07/14 1224)  insulin aspart (novoLOG) injection 8 Units (8 Units Intravenous Given 03/07/14 0951)  dextrose 50 % solution 50 mL (50 mLs Intravenous Given 03/07/14 0952)  albuterol (PROVENTIL) (2.5 MG/3ML) 0.083% nebulizer solution 5 mg (5 mg Nebulization Given 03/07/14 1052)    Filed Vitals:   03/07/14 1230 03/07/14 1245 03/07/14 1727 03/07/14 1735  BP: 142/55 148/59 182/73 172/70  Pulse: 73 65 100 99  Temp:      TempSrc:      Resp: 30 17 16    Height:      Weight:   179 lb 10.8 oz (81.5 kg)   SpO2: 98% 98% 92%     Final diagnoses:  Acute pulmonary edema  End-stage renal disease needing dialysis  Acute hyperkalemia  Acute dyspnea  Chest pain, unspecified chest pain type    Admission/ observation were discussed with the admitting physician, patient and/or family and they are comfortable with the plan.      Mariea Clonts, MD 03/07/14 1740

## 2014-03-07 NOTE — Consult Note (Signed)
Indication for Consultation: Management of ESRD/hemodialysis; anemia, hypertension/volume and secondary hyperparathyroidism  HPI:   [name] a 59 y.o. Female, with CAD s/p CABG, Aortic stenosis s/p aortic valve replacement (2/15), ESRD (TTS), Hep C s/p liver transplant on chronic immunosuppressive therapy, DM2, HTN, Grade 2 Diastolic dysfuntion (EF 49% 1/15), hx of recent admission 2/2 SVT, presented with SOB, chest pain and pressure for the past 24 hours. Pt reports sternal chest pain and pressure which does not radiate. Associated symptoms are SOB and cough without sputum production. Pt reports feeling fluid overloads and attributes SOB and pain to having and shorted HD treatment on Sat. (3 hrs). Pt has not received HD today. In the ED the pt Trop 0.02, BNP 1902, EKG sinus rhythm with prolonged PR interval,  CXR no acute disease. Patient was admitted to the floor and nephrology was consulted to manage HD.   Pt states she drank 2 bowls extra home made soup and shoved snow yesterday and became sob.  Left HD Sat 2 liters over and xs fluid.  Feels miserable.   Past Medical History  Diagnosis Date  . 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 Chronic back pain   . CAD (coronary artery disease)   . Obesity   . Peripheral vascular disease hands and legs  . Anxiety   . Asthma   . GERD (gastroesophageal reflux disease)     takes Omeprazole daily  . Chronic constipation     takes MIralax and Colace daily  . Anemia     takes Folic Acid daily  . Hypothyroidism     takes Synthroid daily  . Depression     takes Cymbalta for "severe" depression  . Neuromuscular disorder     carpal tunnel in right hand  . Hypertension     takes Metoprolol and Lisinopril daily, sees Dr Bea Graff  . COPD (chronic obstructive pulmonary disease)   . Pneumonia     "today and several times before" (08/30/2012)  . Chronic bronchitis     "q yr  w/season changes" (08/30/2012)  . Type II diabetes mellitus     Levemir 2units daily if > 150  . History of blood transfusion     "several" (08/30/2012)  . Hepatitis C   . Migraine     "last migraine was in 2013" (08/30/2012)  . Headache     "at least monthly" (08/30/2012)  . Arthritis     "left hand, back" (08/30/2012)  . End stage renal disease on dialysis 02/27/2011    Kidneys shut down at time of liver transplant in Sept 2011 at [facility] in [place], she has been on HD ever since.  Dialyzes at [facility] HD on TTS schedule.  Had L forearm graft used 10 months then removed Dec 2012 due to suspected infection.  A right upper arm AVG was placed Dec 2012 but she developed steal symptoms acutely and it was ligated the same day.  Never had an AV fistula due to small veins.  Now has L thigh AVG put in Jan 2013, has not clotted to date.    [name] CAD (coronary artery disease) Jan. 2015    Cath: 20% LAD, 50% D1; s/p LIMA-LAD  . Aortic stenosis   . Tobacco abuse   . S/P aortic valve replacement 03/15/13    Mechanical     Past Surgical History  Procedure Laterality Date  .  Liver transplant  10/25/2009    sees Dr Ferol Luz 1 every 6 months, saw last in Dec 2013. [name] Coord [phone]  . Small intestine surgery  90's  . Thrombectomy    . Arteriovenous graft placement Left 10/03/10     forearm  . Avgg removal  12/23/2010    Procedure: REMOVAL OF ARTERIOVENOUS GORETEX GRAFT ([place]);  Surgeon: Elam Dutch, MD;  Location: [place];  Service: Vascular;  Laterality: Left;  procedure started @1736 -1852  . Insertion of dialysis catheter  12/23/2010    Procedure: INSERTION OF DIALYSIS CATHETER;  Surgeon: Elam Dutch, MD;  Location: [place];  Service: Vascular;  Laterality: Right;  Right Internal Jugular 28cm dialysis catheter insertion procedure time 1701-1720   . Cholecystectomy  1993  . Cystoscopy  1990's  . Spinal growth rods  2010    "put 2 metal rods in my back; they had detetriorated"  (08/30/2012)  . Av fistula placement  01/29/2011    Procedure: INSERTION OF ARTERIOVENOUS (AV) GORE-TEX GRAFT ARM;  Surgeon: Elam Dutch, MD;  Location: [place];  Service: Vascular;  Laterality: Right;  . Av fistula placement  03/10/2011    Procedure: INSERTION OF ARTERIOVENOUS (AV) GORE-TEX GRAFT THIGH;  Surgeon: Elam Dutch, MD;  Location: [place];  Service: Vascular;  Laterality: Left;  . Tubal ligation  1990's  . Cardiac catheterization  2014  . Aortic valve replacement N/A 03/15/2013    AVR; Surgeon: Ivin Poot, MD;  Location: [facility] OR; Open Heart Surgery;  [employer] mechanical prosthesis, top hat valve  . Coronary artery bypass graft N/A 03/15/2013    Procedure: CORONARY ARTERY BYPASS GRAFTING (CABG) times one using left internal mammary artery.;  Surgeon: Ivin Poot, MD;  Location: [place];  Service: Open Heart Surgery;  Laterality: N/A;  POSS CABG X 1  . Intraoperative transesophageal echocardiogram N/A 03/15/2013    Procedure: INTRAOPERATIVE TRANSESOPHAGEAL ECHOCARDIOGRAM;  Surgeon: Ivin Poot, MD;  Location: [place];  Service: Open Heart Surgery;  Laterality: N/A;  . Left heart catheterization with coronary/graft angiogram N/A 12/24/2011    Procedure: LEFT HEART CATHETERIZATION WITH Beatrix Fetters;  Surgeon: [name], MD;  Location: [facility] CATH LAB;  Service: Cardiovascular;  Laterality: N/A;  . Left heart catheterization with coronary angiogram N/A 07/29/2012    Procedure: LEFT HEART CATHETERIZATION WITH CORONARY ANGIOGRAM;  Surgeon: Troy Sine, MD;  Location: [facility] CATH LAB;  Service: Cardiovascular;  Laterality: N/A;  . Left heart catheterization with coronary angiogram N/A 03/10/2013    Procedure: LEFT HEART CATHETERIZATION WITH CORONARY ANGIOGRAM;  Surgeon: Troy Sine, MD;  Location: [facility] CATH LAB;  Service: Cardiovascular;  Laterality: N/A;  . Left heart catheterization with coronary/graft angiogram N/A 12/16/2013    Procedure: LEFT HEART CATHETERIZATION  WITH Beatrix Fetters;  Surgeon: Troy Sine, MD;  Location: [facility] CATH LAB;  Service: Cardiovascular;  Laterality: N/A;   Family History  Problem Relation Age of Onset  . Cancer Mother   . Diabetes Mother   . Hypertension Mother   . Stroke Mother   . Cancer Father   . Anesthesia problems Neg Hx   . Hypotension Neg Hx   . Malignant hyperthermia Neg Hx   . Pseudochol deficiency Neg Hx    History   Social History  . Marital Status: Divorced    Spouse Name: N/A    Number of Children: N/A  . Years of Education: N/A   Occupational History  . Not on file.  Social History Main Topics  . Smoking status: Current Some Day Smoker -- 0.75 packs/day for 40 years    Types: Cigarettes    Last Attempt to Quit: 03/13/2013  . Smokeless tobacco: Never Used     Comment: still smokng 1 or 2 a day  . Alcohol Use: Yes     Comment: 08/30/2012 "last drink was at a wedding July, 2014; had a small glass of wine; never had problems w/alcohol"  . Drug Use: No  . Sexual Activity: Not Currently   Other Topics Concern  . Not on file   Social History Narrative    Allergies  Allergen Reactions  . Acetaminophen Other (See Comments)    Liver transplant recipient   . Codeine Itching  . Mirtazapine Other (See Comments)    hallucination  . Morphine Itching   Prior to Admission medications   Medication Sig Start Date End Date Taking? Authorizing Provider  albuterol (PROVENTIL,VENTOLIN) 90 MCG/ACT inhaler Inhale 1-2 puffs into the lungs every 6 (six) hours as needed for wheezing or shortness of breath.    Yes Historical Provider, MD  aspirin EC 81 MG tablet Take 81 mg by mouth daily.    Yes Historical Provider, MD  calcium acetate (PHOSLO) 667 MG capsule Take 2,001 mg by mouth 3 (three) times daily with meals.   Yes Historical Provider, MD  calcium carbonate (TUMS - DOSED IN MG ELEMENTAL CALCIUM) 500 MG chewable tablet Chew 2 tablets by mouth daily as needed for  indigestion or heartburn.    Yes Historical Provider, MD  cinacalcet (SENSIPAR) 30 MG tablet Take 30 mg by mouth daily.   Yes Historical Provider, MD  gabapentin (NEURONTIN) 300 MG capsule Take 300 mg by mouth at bedtime.    Yes Historical Provider, MD  insulin detemir (LEVEMIR) 100 UNIT/ML injection Inject 1-2 Units into the skin daily as needed (blood). 150-200 do 2 units >200 do 5 units   Yes Historical Provider, MD  levothyroxine (SYNTHROID, LEVOTHROID) 150 MCG tablet Take 150 mcg by mouth daily before breakfast.   Yes Historical Provider, MD  LORazepam (ATIVAN) 0.5 MG tablet Take 0.5 mg by mouth at bedtime as needed for sleep.  02/08/13  Yes Historical Provider, MD  metoprolol (LOPRESSOR) 50 MG tablet On non dialysis days take 1 & 1/2 tablet in the morning and 1 tablet in the evening. On dialysis days take 1 tablet twice daily Patient taking differently: Take 50 mg by mouth 2 (two) times daily.  12/28/13  Yes Troy Sine, MD  Multiple Vitamin (MULTIVITAMIN WITH MINERALS) TABS tablet Take 1 tablet by mouth at bedtime.    Yes Historical Provider, MD  ondansetron (ZOFRAN-ODT) 4 MG disintegrating tablet Take 4 mg by mouth every 8 (eight) hours as needed for nausea or vomiting.   Yes Historical Provider, MD  polyethylene glycol (MIRALAX / GLYCOLAX) packet Take 17 g by mouth daily as needed for mild constipation.    Yes Historical Provider, MD  promethazine (PHENERGAN) 12.5 MG tablet Take 12.5 mg by mouth every 4 (four) hours as needed for nausea.  02/09/13  Yes Historical Provider, MD  tacrolimus (PROGRAF) 1 MG capsule Take 3 mg by mouth 2 (two) times daily.    Yes Historical Provider, MD  traMADol (ULTRAM) 50 MG tablet Take 50 mg by mouth every 12 (twelve) hours as needed for moderate pain.  02/09/13  Yes Historical Provider, MD  warfarin (COUMADIN) 4 MG tablet Take 1 tablet (4 mg total) by mouth daily. 02/24/14  Yes  Osa Craver, MD    Scheduled Meds: . [START ON 03/08/2014] aspirin EC  81 mg Oral Daily  . cinacalcet  30 mg Oral Q breakfast  . gabapentin  300 mg Oral QHS  . levothyroxine  150 mcg Oral QAC breakfast  . metoprolol  50 mg Oral BID  . tacrolimus  3 mg Oral BID   Continuous Infusions:  PRN Meds:.LORazepam, ondansetron **OR** ondansetron (ZOFRAN) IV Labs: CBC    Component Value Date/Time   WBC 5.8 03/07/2014 0816   RBC 3.68* 03/07/2014 0816   HGB 11.5* 03/07/2014 0816   HCT 36.0 03/07/2014 0816   PLT  03/07/2014 0816    PLATELET CLUMPS NOTED ON SMEAR, COUNT APPEARS DECREASED   MCV 97.8 03/07/2014 0816   MCH 31.3 03/07/2014 0816   MCHC 31.9 03/07/2014 0816   RDW 14.4 03/07/2014 0816   LYMPHSABS 1.3 02/19/2014 0243   MONOABS 0.5 02/19/2014 0243   EOSABS 0.1 02/19/2014 0243   BASOSABS 0.0 02/19/2014 0243    CMP Latest Ref Rng 03/07/2014 03/07/2014 02/24/2014  Glucose 70 - 99 mg/dL - 137(H) 105(H)  BUN 6 - 23 mg/dL - 46(H) 22  Creatinine 0.50 - 1.10 mg/dL - 11.16(H) 6.52(H)  Sodium 135 - 145 mmol/L - 137 133(L)  Potassium 3.5 - 5.1 mmol/L 6.3(HH) 6.0(H) 4.0  Chloride 96 - 112 mmol/L - 99 96  CO2 19 - 32 mmol/L - 24 25  Calcium 8.4 - 10.5 mg/dL - 7.7(L) 7.7(L)  Total Protein 6.0 - 8.3 g/dL - 7.6 -  Total Bilirubin 0.3 - 1.2 mg/dL - 0.9 -  Alkaline Phos 39 - 117 U/L - 88 -  AST 0 - 37 U/L - 42(H) -  ALT 0 - 35 U/L - 35 -   Troponin: 0.02  BNP: 1902 INR: 2.65  Dg Chest 2 View  02/19/2014   CLINICAL DATA:  Sharp chest pain with palpitations, headache, and shortness of breath. Currently on warfarin. Smoker. Dialysis patient.  EXAM: CHEST  2 VIEW  COMPARISON:  01/03/2014  FINDINGS: Postoperative changes in the mediastinum, lower lumbar spine, and upper abdomen. Cardiac valve prosthesis. The heart size and mediastinal contours are within normal limits. Both lungs are clear. The visualized skeletal structures are unremarkable. Calcification of the aorta.  IMPRESSION: No  active cardiopulmonary disease.   Electronically Signed   By: Lucienne Capers M.D.   On: 02/19/2014 02:58   Dg Chest Portable 1 View  03/07/2014   CLINICAL DATA:  Acute chest pain since yesterday.  EXAM: PORTABLE CHEST - 1 VIEW  COMPARISON:  February 19, 2014.  FINDINGS: Stable cardiomediastinal silhouette. Sternotomy wires are noted. No pneumothorax or significant pleural effusion is noted. No acute pulmonary disease is noted. Bony thorax appears intact.  IMPRESSION: No acute cardiopulmonary abnormality seen.   Electronically Signed   By: Sabino Dick M.D.   On: 03/07/2014 08:15   ROS:  General: Positive Headache  Heart: Positive Chest pain  Lung: Positive SOB Abdomin: Denies N/V, abdominal Pain  GU: Anuria (at baseline)  MS: Denies edema.   Physical Exam: Blood pressure 148/59, pulse 65, temperature 98.3 F (36.8 C), temperature source Oral, resp. rate 17, height 5' 4.5" (1.638 m), weight 81 kg (178 lb 9.2 oz), SpO2 98 %.  General: Lying in bed, moderate to severe distress, A&Ox3.  SOB sitting up to breath Head: Sclera non-icteric, Neck: Supple. JVD not elevated. JVD to angle of jaw sittin Lungs: , labored breathing  With some faint rales LEFT and  R bases, decreased bs Heart: RRR with S1 S2. S4, Gr 3/6 M, LV lift Abdomen: Soft, non-tender, non-distended. No rebound/guarding. No obvious abdominal masses. +BS Liver down 6 cm, pos bs Lower extremities: 1+ edema.  Dialysis Access: Left thigh AVG +b/t  HD: [name]  3h 68min 400/A1.5 76.5kg 2/2.0 [place] Heparin none L thigh AVG Calcitriol 0.5 ug TIW Recent Labs: TStat: 24 P 4.5 (12/15), Hgb 11.6 (1/21)   Assessment/Plan: 1. Chest pain - Trop 0.02, BNP 1902, EKG sinus rhythm with prolonged PR interval, CXR no acute disease.  Cath Nov '15, ECHO 02/20/14 EF 55%, SVT 02/20/14. Possible related to fluid overload. Monitor with HD.   2. ESRD / Anion Gap Metabolic Acidosis - HD TTS. Last HD Sat, ran only 3 hrs due to snow. HD today. K+  6 Will use longer HD to lower UFR, and more vol off. Cool dialysate, and not V Na 3. Hyperkalemia - K+ 6.3 Albuterol and insulin treatment in ED. HD today. Monitor.   4. HTN/ Volume - BP 148/59, Medication: Metoprolol. Wt 81, wt 76.9 (1/15), EDW 76.5. Significantly above EDW  4.5 L off as tolerated.  5. Anemia - Hgb 11.5   6. Metabolic bone disease - Ca 7.7, P 4.4 (1/15), continue sensipar and calcitriol. Binder phoslo   7. Nutrition - Alb 3.1, diet renal, renal vit.   8. Liver Transplant - Immunosuppressive medications  9. DM2 - Primary, insulin    Yvone Neu  PA Student  03/07/2014 3:30 pm  I have seen and examined this patient and agree with the plan of care seen, examined, note revised, discussed and patient counseled. .  Amairany Schumpert L 03/07/2014, 5:14 PM

## 2014-03-07 NOTE — ED Notes (Signed)
Spoke with Saralyn Pilar, RT to assess pt./

## 2014-03-07 NOTE — ED Notes (Signed)
Admitting hospitalist at the bedside

## 2014-03-07 NOTE — Procedures (Signed)
I was present at this session.  I have reviewed the session itself and made appropriate changes.  Better early , access ok. Severe vol xs.    [name] L 1/26/20165:41 PM

## 2014-03-08 DIAGNOSIS — J810 Acute pulmonary edema: Principal | ICD-10-CM

## 2014-03-08 DIAGNOSIS — N186 End stage renal disease: Secondary | ICD-10-CM

## 2014-03-08 DIAGNOSIS — E875 Hyperkalemia: Secondary | ICD-10-CM | POA: Insufficient documentation

## 2014-03-08 LAB — GLUCOSE, CAPILLARY
Glucose-Capillary: 99 mg/dL (ref 70–99)
Glucose-Capillary: 186 mg/dL — ABNORMAL HIGH (ref 70–99)
Glucose-Capillary: 144 mg/dL — ABNORMAL HIGH (ref 70–99)
Glucose-Capillary: 153 mg/dL — ABNORMAL HIGH (ref 70–99)

## 2014-03-08 LAB — BASIC METABOLIC PANEL
Anion gap: 8 (ref 5–15)
BUN: 27 mg/dL — ABNORMAL HIGH (ref 6–23)
CO2: 29 mmol/L (ref 19–32)
Calcium: 7.8 mg/dL — ABNORMAL LOW (ref 8.4–10.5)
Chloride: 95 mmol/L — ABNORMAL LOW (ref 96–112)
Creatinine, Ser: 6.84 mg/dL — ABNORMAL HIGH (ref 0.50–1.10)
GFR calc non Af Amer: 6 mL/min — ABNORMAL LOW (ref >90)
GFR, EST AFRICAN AMERICAN: 7 mL/min — AB (ref >90)
GLUCOSE: 150 mg/dL — AB (ref 70–99)
Potassium: 5.1 mmol/L (ref 3.5–5.1)
Sodium: 132 mmol/L — ABNORMAL LOW (ref 135–145)

## 2014-03-08 LAB — TROPONIN I: TROPONIN I: 0.03 ng/mL (ref <0.031)

## 2014-03-08 LAB — CBC
HCT: 30.7 % — ABNORMAL LOW (ref 36.0–46.0)
Hemoglobin: 10.1 g/dL — ABNORMAL LOW (ref 12.0–15.0)
MCH: 31.8 pg (ref 26.0–34.0)
MCHC: 32.9 g/dL (ref 30.0–36.0)
MCV: 96.5 fL (ref 78.0–100.0)
Platelets: 96 10*3/uL — ABNORMAL LOW (ref 150–400)
RBC: 3.18 MIL/uL — ABNORMAL LOW (ref 3.87–5.11)
RDW: 14.5 % (ref 11.5–15.5)
WBC: 6.2 10*3/uL (ref 4.0–10.5)

## 2014-03-08 LAB — PROTIME-INR
INR: 2.52 — AB (ref 0.00–1.49)
Prothrombin Time: 27.3 seconds — ABNORMAL HIGH (ref 11.6–15.2)

## 2014-03-08 LAB — MRSA PCR SCREENING: MRSA BY PCR: NEGATIVE

## 2014-03-08 MED ORDER — SODIUM CHLORIDE 0.9 % IV BOLUS (SEPSIS)
250.0000 mL | Freq: Once | INTRAVENOUS | Status: AC
  Administered 2014-03-08: 250 mL via INTRAVENOUS

## 2014-03-08 MED ORDER — WARFARIN SODIUM 5 MG PO TABS
5.0000 mg | ORAL_TABLET | Freq: Once | ORAL | Status: AC
  Administered 2014-03-08: 5 mg via ORAL
  Filled 2014-03-08: qty 1

## 2014-03-08 NOTE — Progress Notes (Signed)
Upon arrival to unit from completion of HD treatment this evening, BP obtained was 82/45.  At this time, patient was asymptomatic.  After BP was rechecked (123/38), patient began to complain of lightheadedness.  CBG 156.  O2 98% on 3L.  No complaints of pain.  Notified Lamar Blinks, NP.  Will continue to monitor.

## 2014-03-08 NOTE — Progress Notes (Signed)
[facility] PROGRESS NOTE  [name] JYN:[id number] DOB: [birth year] DOA: 03/07/2014 PCP: No primary care provider on file.  Assessment/Plan: #1 acute pulmonary edema Likely secondary to missed hemodialysis. Clinical improvement postdialysis. EKG with prolonged PR interval. Troponins 0.02. Chest x-ray negative for any acute infiltrate. Patient with cath November 2015 echo general 11 2016 EF of 55%. Continue empiric antibiotics. Follow.  #2 hyperkalemia Resolved with hemodialysis. Per nephrology.  #3 end-stage renal disease on hemodialysis Per nephrology  #4 hypertension Stable. Continue metoprolol.  #5 coronary artery disease status post CABG Chest pain resolved with hemodialysis. Continue aspirin and beta blocker.   #6 AVR INR 2.52. Continue Coumadin.  #7 diabetes mellitus CBGs averaged from 99-186. Continue sliding scale insulin.  Code Status: Full Family Communication: updated patient no family at bedside. Disposition Plan: Home when medically stable.   Consultants:  Nephrology: Dr. Jimmy Footman 03/07/2014  Procedures:  Chest x-ray 03/07/2014  Antibiotics:  None  HPI/Subjective: Patient states significant improvement with SOB and chest pressure than on admission.  Objective: Filed Vitals:   03/08/14 1206  BP: 121/56  Pulse: 74  Temp:   Resp:     Intake/Output Summary (Last 24 hours) at 03/08/14 1523 Last data filed at 03/08/14 0900  Gross per 24 hour  Intake    360 ml  Output   3087 ml  Net  -2727 ml   Filed Weights   03/07/14 1727 03/07/14 2132 03/07/14 2304  Weight: 81.5 kg (179 lb 10.8 oz) 78.5 kg (173 lb 1 oz) 79.1 kg (174 lb 6.1 oz)    Exam:   General:  NAD  Cardiovascular: RRR  Respiratory: CTAB  Abdomen: Soft/NT/ND/+BS  Musculoskeletal: No c/c/e  Data Reviewed: Basic Metabolic Panel:  Recent Labs Lab 03/07/14 0816  03/07/14 1836 03/07/14 1910 03/07/14 1920 03/07/14 2020 03/08/14 0340  NA 137  --   --   --   134*  --  132*  K 6.0*  < > 4.5 3.7 3.9 3.8 5.1  CL 99  --   --   --  96  --  95*  CO2 24  --   --   --  25  --  29  GLUCOSE 137*  --   --   --  151*  --  150*  BUN 46*  --   --   --  22  --  27*  CREATININE 11.16*  --   --   --  5.40*  --  6.84*  CALCIUM 7.7*  --   --   --  8.1*  --  7.8*  < > = values in this interval not displayed. Liver Function Tests:  Recent Labs Lab 03/07/14 0816  AST 42*  ALT 35  ALKPHOS 88  BILITOT 0.9  PROT 7.6  ALBUMIN 3.1*   No results for input(s): LIPASE, AMYLASE in the last 168 hours. No results for input(s): AMMONIA in the last 168 hours. CBC:  Recent Labs Lab 03/07/14 0816 03/08/14 0340  WBC 5.8 6.2  HGB 11.5* 10.1*  HCT 36.0 30.7*  MCV 97.8 96.5  PLT PLATELET CLUMPS NOTED ON SMEAR, COUNT APPEARS DECREASED 96*   Cardiac Enzymes:  Recent Labs Lab 03/07/14 1700 03/07/14 1920 03/08/14 0340  TROPONINI 0.04* 0.03 0.03   BNP (last 3 results)  Recent Labs  01/02/14 2318  PROBNP 33098.0*   CBG:  Recent Labs Lab 03/07/14 1300 03/07/14 2259 03/08/14 0750 03/08/14 1147  GLUCAP 84 156* 99 186*    Recent Results (from  the past 240 hour(s))  MRSA PCR Screening     Status: None   Collection Time: 03/07/14 11:58 PM  Result Value Ref Range Status   MRSA by PCR NEGATIVE NEGATIVE Final    Comment:        The GeneXpert MRSA Assay (FDA approved for NASAL specimens only), is one component of a comprehensive MRSA colonization surveillance program. It is not intended to diagnose MRSA infection nor to guide or monitor treatment for MRSA infections.      Studies: Dg Chest Portable 1 View  03/07/2014   CLINICAL DATA:  Acute chest pain since yesterday.  EXAM: PORTABLE CHEST - 1 VIEW  COMPARISON:  February 19, 2014.  FINDINGS: Stable cardiomediastinal silhouette. Sternotomy wires are noted. No pneumothorax or significant pleural effusion is noted. No acute pulmonary disease is noted. Bony thorax appears intact.  IMPRESSION: No  acute cardiopulmonary abnormality seen.   Electronically Signed   By: Sabino Dick M.D.   On: 03/07/2014 08:15    Scheduled Meds: . aspirin EC  81 mg Oral Daily  . azithromycin  500 mg Oral Daily  . calcitRIOL  0.5 mcg Oral Q T,Th,Sa-HD  . calcium acetate  1,334 mg Oral TID WC  . cinacalcet  30 mg Oral Q breakfast  . gabapentin  300 mg Oral QHS  . insulin aspart  0-9 Units Subcutaneous TID WC  . ketorolac  30 mg Intravenous Once  . levothyroxine  150 mcg Oral QAC breakfast  . metoprolol  50 mg Oral BID  . multivitamin  1 tablet Oral QHS  . tacrolimus  3 mg Oral BID  . warfarin  5 mg Oral ONCE-1800  . Warfarin - Pharmacist Dosing Inpatient   Does not apply q1800   Continuous Infusions:   Principal Problem:   Acute pulmonary edema Active Problems:   End stage renal disease on dialysis-TTS   S/P mechanical AVR Feb 2015   Chronic anticoagulation w/ coumadin, goal INR 2.5-3.0 w/ AVR   DM type 2 (diabetes mellitus, type 2)   ESRD (end stage renal disease)   Hyperkalemia   Dyspnea    Time spent: [place] MD [facility] Pager [phone] If 7PM-7AM, please contact night-coverage at [email or web address], password [facility] 03/08/2014, 3:23 PM  LOS: 1 day

## 2014-03-08 NOTE — Progress Notes (Signed)
[name] Progress Note    Subjective: [name] has no complaints today. She denies SOB, chest pain, palpitation, chest pressure, abdominal pain, n/v, edema. She does admit to feeling bloated. Pt took off nasal canula o2. 97% o2 on room air without SOB.     Objective Blood pressure 115/46, pulse 70, temperature 99 F (37.2 C), temperature source Oral, resp. rate 16, height 5' 4.5" (1.638 m), weight 79.1 kg (174 lb 6.1 oz), SpO2 95 %.  I/O last 3 completed shifts: In: -  Out: 3087 [Other:3087]   Net I/O - 3,087  Net UF (1/26) 3 L   Physical Exam General: Lying in bed, NAD, A&Ox3.  Head: Sclera non-icteric, Neck: Supple. Mild JVD elevation. Lungs:  Clear to ascultation bilaterally, unlabored without use of accessory muscles.  Heart: RRR with S1 S2, without murmur, rub or gallop.  Abdomen: Soft, non-tender, non-distended. No rebound/guarding. No obvious abdominal masses. +BS  Lower extremities: No LE edema Dialysis Access: Left thigh AVG +b/t   Additional Objective Labs: CBC    Component Value Date/Time   WBC 6.2 03/08/2014 0340   RBC 3.18* 03/08/2014 0340   HGB 10.1* 03/08/2014 0340   HCT 30.7* 03/08/2014 0340   PLT 96* 03/08/2014 0340   MCV 96.5 03/08/2014 0340   MCH 31.8 03/08/2014 0340   MCHC 32.9 03/08/2014 0340   RDW 14.5 03/08/2014 0340   LYMPHSABS 1.3 02/19/2014 0243   MONOABS 0.5 02/19/2014 0243   EOSABS 0.1 02/19/2014 0243   BASOSABS 0.0 02/19/2014 0243    CMP Latest Ref Rng 03/08/2014 03/07/2014 03/07/2014  Glucose 70 - 99 mg/dL 150(H) - 151(H)  BUN 6 - 23 mg/dL 27(H) - 22  Creatinine 0.50 - 1.10 mg/dL 6.84(H) - 5.40(H)  Sodium 135 - 145 mmol/L 132(L) - 134(L)  Potassium 3.5 - 5.1 mmol/L 5.1 3.8 3.9  Chloride 96 - 112 mmol/L 95(L) - 96  CO2 19 - 32 mmol/L 29 - 25  Calcium 8.4 - 10.5 mg/dL 7.8(L) - 8.1(L)  Total Protein 6.0 - 8.3 g/dL - - -  Total Bilirubin 0.3 - 1.2 mg/dL - - -  Alkaline Phos 39 - 117 U/L - - -  AST 0 - 37  U/L - - -  ALT 0 - 35 U/L - - -   CBG (last 3)   Recent Labs  03/07/14 1300 03/07/14 2259 03/08/14 0750  GLUCAP 84 156* 99   Cardiac Panel (last 3 results)  Recent Labs  03/07/14 1700 03/07/14 1920 03/08/14 0340  TROPONINI 0.04* 0.03 0.03   Dg Chest 2 View  02/19/2014   CLINICAL DATA:  Sharp chest pain with palpitations, headache, and shortness of breath. Currently on warfarin. Smoker. Dialysis patient.  EXAM: CHEST  2 VIEW  COMPARISON:  01/03/2014  FINDINGS: Postoperative changes in the mediastinum, lower lumbar spine, and upper abdomen. Cardiac valve prosthesis. The heart size and mediastinal contours are within normal limits. Both lungs are clear. The visualized skeletal structures are unremarkable. Calcification of the aorta.  IMPRESSION: No active cardiopulmonary disease.   Electronically Signed   By: Lucienne Capers M.D.   On: 02/19/2014 02:58   Dg Chest Portable 1 View  03/07/2014   CLINICAL DATA:  Acute chest pain since yesterday.  EXAM: PORTABLE CHEST - 1 VIEW  COMPARISON:  February 19, 2014.  FINDINGS: Stable cardiomediastinal silhouette. Sternotomy wires are noted. No pneumothorax or significant pleural effusion is noted. No acute pulmonary disease is noted. Bony thorax appears intact.  IMPRESSION: No acute  cardiopulmonary abnormality seen.   Electronically Signed   By: Sabino Dick M.D.   On: 03/07/2014 08:15    Medications: Scheduled Meds: . aspirin EC  81 mg Oral Daily  . azithromycin  500 mg Oral Daily  . calcitRIOL  0.5 mcg Oral Q T,Th,Sa-HD  . calcium acetate  1,334 mg Oral TID WC  . cinacalcet  30 mg Oral Q breakfast  . gabapentin  300 mg Oral QHS  . insulin aspart  0-9 Units Subcutaneous TID WC  . ketorolac  30 mg Intravenous Once  . levothyroxine  150 mcg Oral QAC breakfast  . metoprolol  50 mg Oral BID  . multivitamin  1 tablet Oral QHS  . tacrolimus  3 mg Oral BID  . warfarin  4 mg Oral ONCE-1800  . Warfarin - Pharmacist Dosing Inpatient   Does not  apply q1800   Continuous Infusions:  PRN Meds:.LORazepam, ondansetron **OR** ondansetron (ZOFRAN) IV   Dialysis Orders:  HD: [name]  3h 24min 400/A1.5 76.5kg 2/2.0 Bath Heparin none L thigh AVG Calcitriol 0.5 ug TIW Recent Labs: TStat: 24 P 4.5 (12/15), Hgb 11.6 (1/21)  Assessment/Plan: 1. Chest pain / Dyspnea - Trop 0.02->0.03, BNP 1902, EKG sinus rhythm with prolonged PR interval, CXR no acute disease. Cath Nov '15, ECHO 02/20/14 EF 55%, SVT 02/20/14. Improved after HD and fluid removal. Likely 2/2 to fluid overload. On Azithromycin.   2. ESRD / Anion Gap Metabolic Acidosis - HD TTS. HD Sat, ran only 3 hrs due to snow. HD yesterday for 4 hr. Net UF 3 L.  3. Hyperkalemia - K+ 6.3->5.1 Albuterol and insulin treatment in ED. HD yesterday on 2K bath. Monitor.   4. HTN/ Volume - BP 115/46, Hypotension during HD (82/45), Medication: Metoprolol. Wt 79.1, wt 76.9 (1/15), EDW 76.5.  CXR no pulm edema, on PE does not appear overly volume overloaded. Pt may wait until tomorrow to have HD and remain on schedule.  5. Anemia - Hgb 10.1  6. Metabolic bone disease - Ca 7.8, P 4.4 (1/15), continue sensipar and calcitriol. Binder phoslo   7. Nutrition - Alb 3.1, diet renal, renal vit.   8. Liver Transplant - Immunosuppressive medications  9. DM2 - Primary, insulin   Yvone Neu PA student  03/08/2014 9:30 am  Pt seen, examined and agree w A/P as above. HD again today to get down to dry wt. If she does well could possibly be dc'd later today and get OP HD tomorrow.  [name] Splinter MD pager [phone]    cell [phone] 03/08/2014, 10:32 AM

## 2014-03-08 NOTE — Progress Notes (Signed)
ANTICOAGULATION CONSULT NOTE - Follow Up Consult  Pharmacy Consult for warfarin Indication: mechanical AVR  Allergies  Allergen Reactions  . Acetaminophen Other (See Comments)    Liver transplant recipient   . Codeine Itching  . Mirtazapine Other (See Comments)    hallucination  . Morphine Itching    Patient Measurements: Height: 5' 4.5" (163.8 cm) Weight: 174 lb 6.1 oz (79.1 kg) IBW/kg (Calculated) : 55.85 Vital Signs: Temp: 98.4 F (36.9 C) (01/27 1105) Temp Source: Oral (01/27 1105) BP: 111/38 mmHg (01/27 1105) Pulse Rate: 74 (01/27 1105) Labs:  Recent Labs  03/07/14 0816 03/07/14 1700 03/07/14 1920 03/08/14 0340  HGB 11.5*  --   --  10.1*  HCT 36.0  --   --  30.7*  PLT PLATELET CLUMPS NOTED ON SMEAR, COUNT APPEARS DECREASED  --   --  96*  LABPROT 28.5*  --   --  27.3*  INR 2.65*  --   --  2.52*  CREATININE 11.16*  --  5.40* 6.84*  TROPONINI  --  0.04* 0.03 0.03    Estimated Creatinine Clearance: 9.1 mL/min (by C-G formula based on Cr of 6.84).  Medications:  Scheduled:  . aspirin EC  81 mg Oral Daily  . azithromycin  500 mg Oral Daily  . calcitRIOL  0.5 mcg Oral Q T,Th,Sa-HD  . calcium acetate  1,334 mg Oral TID WC  . cinacalcet  30 mg Oral Q breakfast  . gabapentin  300 mg Oral QHS  . insulin aspart  0-9 Units Subcutaneous TID WC  . ketorolac  30 mg Intravenous Once  . levothyroxine  150 mcg Oral QAC breakfast  . metoprolol  50 mg Oral BID  . multivitamin  1 tablet Oral QHS  . tacrolimus  3 mg Oral BID  . warfarin  4 mg Oral ONCE-1800  . Warfarin - Pharmacist Dosing Inpatient   Does not apply q1800    Assessment: 59 year old female on warfarin chronically for mechanical AVR. Home regimen was 4mg  daily with last dose prior to admission on 03/06/14. INR on admission was 2.65 and fell today to 2.52. Patient missed dose on 1/26 as not entered. Hgb 10.1 and platelets 96 (per historic results in EPIC this is chronically low). No bleeding reported.   Goal  of Therapy:  INR 2.5-3 (per Coumadin clinic)  Monitor platelets by anticoagulation protocol: Yes   Plan:  Warfarin 5mg  po x1 today due to missing dose on 03/07/14.  If patient is to discharge, recommend resume home dose of 4 mg daily (after elevated dose today) with INR recheck within 1-2 weeks.  Daily PT/INR while inpatient  Sloan Leiter, PharmD, BCPS Clinical Pharmacist [phone]  03/08/2014,11:06 AM

## 2014-03-08 NOTE — Progress Notes (Signed)
UR Completed.  [phone]  

## 2014-03-08 NOTE — Progress Notes (Signed)
250 cc bolus ordered and administered per NP order.  BP rechecked afterwards and was 91/27.  Patient still complaining of dizziness.  Lamar Blinks, NP made aware.  New orders received for another 250 cc bolus.  Will continue to monitor.

## 2014-03-09 DIAGNOSIS — Z954 Presence of other heart-valve replacement: Secondary | ICD-10-CM

## 2014-03-09 DIAGNOSIS — Z992 Dependence on renal dialysis: Secondary | ICD-10-CM

## 2014-03-09 LAB — BASIC METABOLIC PANEL
ANION GAP: 14 (ref 5–15)
BUN: 57 mg/dL — ABNORMAL HIGH (ref 6–23)
CHLORIDE: 96 mmol/L (ref 96–112)
CO2: 24 mmol/L (ref 19–32)
Calcium: 7.4 mg/dL — ABNORMAL LOW (ref 8.4–10.5)
Creatinine, Ser: 10.18 mg/dL — ABNORMAL HIGH (ref 0.50–1.10)
GFR calc non Af Amer: 4 mL/min — ABNORMAL LOW (ref >90)
GFR, EST AFRICAN AMERICAN: 4 mL/min — AB (ref >90)
Glucose, Bld: 183 mg/dL — ABNORMAL HIGH (ref 70–99)
Potassium: 5.3 mmol/L — ABNORMAL HIGH (ref 3.5–5.1)
Sodium: 134 mmol/L — ABNORMAL LOW (ref 135–145)

## 2014-03-09 LAB — CBC
HCT: 29.8 % — ABNORMAL LOW (ref 36.0–46.0)
Hemoglobin: 9.8 g/dL — ABNORMAL LOW (ref 12.0–15.0)
MCH: 31.5 pg (ref 26.0–34.0)
MCHC: 32.9 g/dL (ref 30.0–36.0)
MCV: 95.8 fL (ref 78.0–100.0)
Platelets: 86 10*3/uL — ABNORMAL LOW (ref 150–400)
RBC: 3.11 MIL/uL — ABNORMAL LOW (ref 3.87–5.11)
RDW: 14.3 % (ref 11.5–15.5)
WBC: 4.7 10*3/uL (ref 4.0–10.5)

## 2014-03-09 LAB — PROTIME-INR
INR: 2.22 — AB (ref 0.00–1.49)
PROTHROMBIN TIME: 24.8 s — AB (ref 11.6–15.2)

## 2014-03-09 LAB — GLUCOSE, CAPILLARY
Glucose-Capillary: 112 mg/dL — ABNORMAL HIGH (ref 70–99)
Glucose-Capillary: 169 mg/dL — ABNORMAL HIGH (ref 70–99)

## 2014-03-09 MED ORDER — WARFARIN SODIUM 5 MG PO TABS
5.0000 mg | ORAL_TABLET | ORAL | Status: AC
  Administered 2014-03-09: 5 mg via ORAL
  Filled 2014-03-09: qty 1

## 2014-03-09 MED ORDER — AZITHROMYCIN 500 MG PO TABS
500.0000 mg | ORAL_TABLET | Freq: Every day | ORAL | Status: DC
Start: 2014-03-09 — End: 2014-03-20

## 2014-03-09 MED ORDER — TRAMADOL HCL 50 MG PO TABS
50.0000 mg | ORAL_TABLET | Freq: Two times a day (BID) | ORAL | Status: DC | PRN
  Administered 2014-03-09: 50 mg via ORAL
  Filled 2014-03-09: qty 1

## 2014-03-09 NOTE — Discharge Summary (Signed)
Physician Discharge Summary  [name] CWU:[id number] DOB: 03-18-1955 DOA: 03/07/2014  PCP: No primary care provider on file.  Admit date: 03/07/2014 Discharge date: 03/09/2014  Time spent: 70 minutes  Recommendations for Outpatient Follow-up:  1. Follow-up with PCP in 1 week. 2. Have PT/INR checked during dialysis on 01/30//2016. 3. Follow-up with hemodialysis on the usual schedule.  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 female with prior h/o CAD s/p CABG, aortic stenosis and s/p aortic valve replacement, ESRD on HD TTS, Diabetes mellitus, hypertension, Chronic diastolic heart failure,  hepatitic C on chronic therapy, who presented to the ED with shortness of breath and chest pressure, sub sternally, non radiating. She denied fevers or chills. Her last HD was Saturday. She reported a productive cough. She denied any nausea, vomiting and abdominal pain. She went to her HD on the day of admission, but her bp was elevated and she was dyspneic and was brought to ED. On arrival to ED, her labs reveal elevated potassium level of 6.2. Her CXR was unremarkable. She was referred to admission to medical services for further evaluation.    Hospital Course:  #1 acute pulmonary edema Patient had presented with shortness of breath and chest pressure which was nonradiating. Patient had missed hemodialysis on the day of  admission secondary to elevated blood pressure shortness of breath. EKG which was done did not have any ischemic changes however did have a prolonged PR interval. Likely secondary to missed hemodialysis. Clinical improvement postdialysis. EKG with prolonged PR interval. Troponins 0.02. Chest x-ray negative for any acute infiltrate. Patient with cath November 2015, echo 1/11/ 2016 EF of 55%. Patient was initially placed on azithromycin empirically for possible bronchitis. Nephrology was consulted and patient underwent hemodialysis and improved significantly during the hospitalization. It was felt patient's symptoms were secondary to acute pulmonary edema secondary to missed hemodialysis. Patient improved clinically and was back to baseline by day of discharge. Patient be discharged home on 3 more days of oral azithromycin to complete a course of antibiotic therapy.   #2 hyperkalemia Resolved with hemodialysis. Per nephrology.  #3 end-stage renal disease on hemodialysis Nephrology was consulted of admission. Patient underwent hemodialysis with significant clinical improvement with his symptoms. Patient received hemodialysis during the hospitalization and will follow-up as outpatient.   #4 hypertension Stable. Continued on home regimen of metoprolol.  #5 coronary artery disease status post CABG Chest pain resolved with hemodialysis. Continued on home regimen of aspirin and beta blocker.   #6 AVR Patient's INR remained in the 2.5 range. Patient was maintained on Coumadin during the hospitalization. Patient will need a PT/INR check during HD on 03/11/2014.  #7 diabetes mellitus CBGs averaged from 99-186.  Patient was maintained on a sliding scale insulin throughout the hospitalization.  Procedures:  Chest x-ray 03/07/2014  Consultations:  Nephrology: Dr. Jimmy Footman 03/07/2014  Discharge Exam: Filed Vitals:   03/09/14 1722  BP: 148/69  Pulse: 64  Temp: 97.7 F (36.5 C)  Resp: 19     General: NAD Cardiovascular: RRR Respiratory: CTAB  Discharge Instructions   Discharge Instructions    Diet - low sodium heart healthy    Complete by:  As directed      Discharge instructions    Complete by:  As directed   Follow up with PCP in 1 week. Follow up at dialysis center on Saturday. Have PT/INR checked at dialysis on Saturday 03/11/14     Increase activity slowly    Complete by:  As directed           Current Discharge Medication List    START taking these medications   Details  azithromycin (ZITHROMAX) 500 MG tablet Take 1 tablet (500 mg total) by mouth daily. TAKE FOR 3 DAYS THEN STOP. Qty: 3 tablet, Refills: 0      CONTINUE these medications which have NOT CHANGED   Details  albuterol (PROVENTIL,VENTOLIN) 90 MCG/ACT inhaler Inhale 1-2 puffs into the lungs every 6 (six) hours as needed for wheezing or shortness of breath.     aspirin EC 81 MG tablet Take 81 mg by mouth daily.      calcium acetate (PHOSLO) 667 MG capsule Take 2,001 mg by mouth 3 (three) times daily with meals.    calcium carbonate (TUMS - DOSED IN MG ELEMENTAL CALCIUM) 500 MG chewable tablet Chew 2 tablets by mouth daily as needed for indigestion or heartburn.     cinacalcet (SENSIPAR) 30 MG tablet Take 30 mg by mouth daily.    gabapentin (NEURONTIN) 300 MG capsule Take 300 mg by mouth at bedtime.     insulin detemir (LEVEMIR) 100 UNIT/ML injection Inject 1-2 Units into the skin daily as needed (blood). 150-200 do 2 units >200 do 5 units    levothyroxine (SYNTHROID, LEVOTHROID) 150 MCG tablet Take 150 mcg by mouth daily before breakfast.    LORazepam (ATIVAN) 0.5 MG tablet Take 0.5 mg by mouth at bedtime as needed for sleep.     metoprolol (LOPRESSOR) 50 MG tablet On non dialysis days take 1 & 1/2 tablet in the morning and 1 tablet in the evening. On dialysis days take 1 tablet twice daily Qty: 180 tablet, Refills: 3    Multiple Vitamin (MULTIVITAMIN WITH MINERALS) TABS tablet Take  1 tablet by mouth at bedtime.     ondansetron (ZOFRAN-ODT) 4 MG disintegrating tablet Take 4 mg by mouth every 8 (eight) hours as needed for nausea or vomiting.    polyethylene glycol (MIRALAX / GLYCOLAX) packet Take 17 g by mouth daily as needed for mild constipation.     promethazine (PHENERGAN) 12.5 MG tablet Take 12.5 mg by mouth every 4 (four) hours as needed for nausea.     tacrolimus (PROGRAF) 1 MG capsule Take 3 mg by mouth 2 (two) times daily.     traMADol (ULTRAM) 50 MG tablet Take 50 mg by mouth every 12 (twelve) hours as needed for moderate pain.     warfarin (COUMADIN) 4 MG tablet Take 1 tablet (4 mg total) by mouth daily. Qty: 30 tablet, Refills: 3       Allergies  Allergen Reactions  . Acetaminophen Other (See Comments)    Liver transplant recipient   . Codeine Itching  . Mirtazapine Other (See Comments)    hallucination  . Morphine Itching   Follow-up Information    Follow up On 03/11/2014.  Why:  Have PT/INR check during dialysis on saturday      Schedule an appointment as soon as possible for a visit in 1 week to follow up.   Why:  f/u with PCP in 1 week      Please follow up.   Why:  f/u in dialysis as scheulded on saturday       The results of significant diagnostics from this hospitalization (including imaging, microbiology, ancillary and laboratory) are listed below for reference.    Significant Diagnostic Studies: Dg Chest 2 View  02/19/2014   CLINICAL DATA:  Sharp chest pain with palpitations, headache, and shortness of breath. Currently on warfarin. Smoker. Dialysis patient.  EXAM: CHEST  2 VIEW  COMPARISON:  01/03/2014  FINDINGS: Postoperative changes in the mediastinum, lower lumbar spine, and upper abdomen. Cardiac valve prosthesis. The heart size and mediastinal contours are within normal limits. Both lungs are clear. The visualized skeletal structures are unremarkable. Calcification of the aorta.  IMPRESSION: No active cardiopulmonary disease.    Electronically Signed   By: Lucienne Capers M.D.   On: 02/19/2014 02:58   Dg Chest Portable 1 View  03/07/2014   CLINICAL DATA:  Acute chest pain since yesterday.  EXAM: PORTABLE CHEST - 1 VIEW  COMPARISON:  February 19, 2014.  FINDINGS: Stable cardiomediastinal silhouette. Sternotomy wires are noted. No pneumothorax or significant pleural effusion is noted. No acute pulmonary disease is noted. Bony thorax appears intact.  IMPRESSION: No acute cardiopulmonary abnormality seen.   Electronically Signed   By: Sabino Dick M.D.   On: 03/07/2014 08:15    Microbiology: Recent Results (from the past 240 hour(s))  MRSA PCR Screening     Status: None   Collection Time: 03/07/14 11:58 PM  Result Value Ref Range Status   MRSA by PCR NEGATIVE NEGATIVE Final    Comment:        The GeneXpert MRSA Assay (FDA approved for NASAL specimens only), is one component of a comprehensive MRSA colonization surveillance program. It is not intended to diagnose MRSA infection nor to guide or monitor treatment for MRSA infections.      Labs: Basic Metabolic Panel:  Recent Labs Lab 03/07/14 0816  03/07/14 1910 03/07/14 1920 03/07/14 2020 03/08/14 0340 03/09/14 1400  NA 137  --   --  134*  --  132* 134*  K 6.0*  < > 3.7 3.9 3.8 5.1 5.3*  CL 99  --   --  96  --  95* 96  CO2 24  --   --  25  --  29 24  GLUCOSE 137*  --   --  151*  --  150* 183*  BUN 46*  --   --  22  --  27* 57*  CREATININE 11.16*  --   --  5.40*  --  6.84* 10.18*  CALCIUM 7.7*  --   --  8.1*  --  7.8* 7.4*  < > = values in this interval not displayed. Liver Function Tests:  Recent Labs Lab 03/07/14 0816  AST 42*  ALT 35  ALKPHOS 88  BILITOT 0.9  PROT 7.6  ALBUMIN 3.1*   No results for input(s): LIPASE, AMYLASE in the last 168 hours. No results for input(s): AMMONIA in the last 168 hours. CBC:  Recent Labs Lab 03/07/14 0816 03/08/14 0340 03/09/14 1501  WBC 5.8 6.2 4.7  HGB 11.5* 10.1* 9.8*  HCT 36.0 30.7* 29.8*   MCV 97.8 96.5 95.8  PLT PLATELET CLUMPS  NOTED ON SMEAR, COUNT APPEARS DECREASED 96* 86*   Cardiac Enzymes:  Recent Labs Lab 03/07/14 1700 03/07/14 1920 03/08/14 0340  TROPONINI 0.04* 0.03 0.03   BNP: BNP (last 3 results)  Recent Labs  01/02/14 2318  PROBNP 33098.0*   CBG:  Recent Labs Lab 03/08/14 1147 03/08/14 1638 03/08/14 2020 03/09/14 0835 03/09/14 1236  GLUCAP 186* 144* 153* 112* 169*       Signed:  THOMPSON,DANIEL MD [facility] 03/09/2014, 6:17 PM

## 2014-03-09 NOTE — Progress Notes (Signed)
PHARMACY NOTE  Pharmacy Consult :  59 y.o. female is currently on Chronic Coumadin for a mechanical AVR.   Dosing Wt :  79 kg  Hematology :  Recent Labs  03/07/14 0816 03/07/14 1920 03/08/14 0340 03/09/14 1400 03/09/14 1436 03/09/14 1501  HGB 11.5*  --  10.1*  --   --  9.8*  HCT 36.0  --  30.7*  --   --  29.8*  PLT PLATELET CLUMPS NOTED ON SMEAR, COUNT APPEARS DECREASED  --  96*  --   --  86*  LABPROT 28.5*  --  27.3*  --  24.8*  --   INR 2.65*  --  2.52*  --  2.22*  --   CREATININE 11.16* 5.40* 6.84* 10.18*  --   --     Current Medication[s] Include: Medication PTA: Medication Sig  . warfarin (COUMADIN) 4 MG tablet Take 1 tablet (4 mg total) by mouth daily.   Scheduled:  Scheduled:  . aspirin EC  81 mg Oral Daily  . azithromycin  500 mg Oral Daily  . calcitRIOL  0.5 mcg Oral Q T,Th,Sa-HD  . calcium acetate  1,334 mg Oral TID WC  . cinacalcet  30 mg Oral Q breakfast  . gabapentin  300 mg Oral QHS  . insulin aspart  0-9 Units Subcutaneous TID WC  . ketorolac  30 mg Intravenous Once  . levothyroxine  150 mcg Oral QAC breakfast  . metoprolol  50 mg Oral BID  . multivitamin  1 tablet Oral QHS  . tacrolimus  3 mg Oral BID  . Warfarin - Pharmacist Dosing Inpatient   Does not apply q1800   Antibiotic[s]: Anti-infectives    Start     Dose/Rate Route Frequency Ordered Stop   03/09/14 0000  azithromycin (ZITHROMAX) 500 MG tablet     500 mg Oral Daily 03/09/14 1535     03/07/14 1745  azithromycin (ZITHROMAX) tablet 500 mg    Comments:  Bronchitis.   500 mg Oral Daily 03/07/14 1732        Assessment :  Today's INR 2.22.   INR is slightly Sub-therapeutic slowly drifting down.  No evidence of bleeding complications observed.  Goal :  INR goal is 2.5-3  Plan : 1. Increase today's dose of Coumadin to 5 mg. 2. Daily INR's, CBC. Monitor for bleeding complications.   Follow Platelet counts. 3. Recommend continuing previous home dose of  Coumadin 4 mg daily upon discharge.  Markavious Micco, Craig Guess,  Pharm.D  03/09/2014  5:23 PM

## 2014-03-09 NOTE — Clinical Documentation Improvement (Deleted)
  K+ 2.2 on 03/10/14.  Pt received 40 meg x 3 on 03/10/14.  Repeat K+ was 3.4  Please document a  Thank You, Erling Conte ,RN Clinical Documentation Specialist:  [phone]  [employer]

## 2014-03-09 NOTE — Progress Notes (Signed)
[facility] Progress Note    Subjective: Ms. Rosello only complain is mild blurry vision while reading and fatigue. She denies SOB, chest pain, chest pressure, abdominal pain, n/v, edema. She does admit to feeling bloated.   Objective Blood pressure 144/37, pulse 64, temperature 97.7 F (36.5 C), temperature source Oral, resp. rate 19, height 5' 4.5" (1.638 m), weight 78.472 kg (173 lb), SpO2 100 %.  I/O last 3 completed shifts: In: 17 [P.O.:720] Out: 3087 [Other:3087]   Net I/O - 2,367  Net UF (1/26) 3 L   Physical Exam General: Lying in bed, NAD, A&Ox3.  Head: Sclera non-icteric, Neck: Supple. Mild JVD elevation. Lungs:  Clear to ascultation bilaterally, unlabored without use of accessory muscles.  Heart: RRR with S1 S2, without murmur, rub or gallop.  Abdomen: Soft, non-tender, non-distended. No rebound/guarding. No obvious abdominal masses. +BS  Lower extremities: No LE edema Dialysis Access: Left thigh AVG +b/t   Additional Objective Labs: CBC    Component Value Date/Time   WBC 6.2 03/08/2014 0340   RBC 3.18* 03/08/2014 0340   HGB 10.1* 03/08/2014 0340   HCT 30.7* 03/08/2014 0340   PLT 96* 03/08/2014 0340   MCV 96.5 03/08/2014 0340   MCH 31.8 03/08/2014 0340   MCHC 32.9 03/08/2014 0340   RDW 14.5 03/08/2014 0340   LYMPHSABS 1.3 02/19/2014 0243   MONOABS 0.5 02/19/2014 0243   EOSABS 0.1 02/19/2014 0243   BASOSABS 0.0 02/19/2014 0243    CMP Latest Ref Rng 03/08/2014 03/07/2014 03/07/2014  Glucose 70 - 99 mg/dL 150(H) - 151(H)  BUN 6 - 23 mg/dL 27(H) - 22  Creatinine 0.50 - 1.10 mg/dL 6.84(H) - 5.40(H)  Sodium 135 - 145 mmol/L 132(L) - 134(L)  Potassium 3.5 - 5.1 mmol/L 5.1 3.8 3.9  Chloride 96 - 112 mmol/L 95(L) - 96  CO2 19 - 32 mmol/L 29 - 25  Calcium 8.4 - 10.5 mg/dL 7.8(L) - 8.1(L)  Total Protein 6.0 - 8.3 g/dL - - -  Total Bilirubin 0.3 - 1.2 mg/dL - - -  Alkaline Phos 39 - 117 U/L - - -  AST 0 - 37 U/L - - -  ALT 0 - 35 U/L -  - -   CBG (last 3)   Recent Labs  03/08/14 1638 03/08/14 2020 03/09/14 0835  GLUCAP 144* 153* 112*   Cardiac Panel (last 3 results)  Recent Labs  03/07/14 1700 03/07/14 1920 03/08/14 0340  TROPONINI 0.04* 0.03 0.03   Dg Chest 2 View  02/19/2014   CLINICAL DATA:  Sharp chest pain with palpitations, headache, and shortness of breath. Currently on warfarin. Smoker. Dialysis patient.  EXAM: CHEST  2 VIEW  COMPARISON:  01/03/2014  FINDINGS: Postoperative changes in the mediastinum, lower lumbar spine, and upper abdomen. Cardiac valve prosthesis. The heart size and mediastinal contours are within normal limits. Both lungs are clear. The visualized skeletal structures are unremarkable. Calcification of the aorta.  IMPRESSION: No active cardiopulmonary disease.   Electronically Signed   By: Lucienne Capers M.D.   On: 02/19/2014 02:58   Dg Chest Portable 1 View  03/07/2014   CLINICAL DATA:  Acute chest pain since yesterday.  EXAM: PORTABLE CHEST - 1 VIEW  COMPARISON:  February 19, 2014.  FINDINGS: Stable cardiomediastinal silhouette. Sternotomy wires are noted. No pneumothorax or significant pleural effusion is noted. No acute pulmonary disease is noted. Bony thorax appears intact.  IMPRESSION: No acute cardiopulmonary abnormality seen.   Electronically Signed   By: Jeneen Rinks  Green M.D.   On: 03/07/2014 08:15    Medications: Scheduled Meds: . aspirin EC  81 mg Oral Daily  . azithromycin  500 mg Oral Daily  . calcitRIOL  0.5 mcg Oral Q T,Th,Sa-HD  . calcium acetate  1,334 mg Oral TID WC  . cinacalcet  30 mg Oral Q breakfast  . gabapentin  300 mg Oral QHS  . insulin aspart  0-9 Units Subcutaneous TID WC  . ketorolac  30 mg Intravenous Once  . levothyroxine  150 mcg Oral QAC breakfast  . metoprolol  50 mg Oral BID  . multivitamin  1 tablet Oral QHS  . tacrolimus  3 mg Oral BID  . Warfarin - Pharmacist Dosing Inpatient   Does not apply q1800   Continuous Infusions:  PRN Meds:.LORazepam,  ondansetron **OR** ondansetron (ZOFRAN) IV, traMADol   Dialysis Orders:  HD: [name]  3h 75min 400/A1.5 76.5kg 2/2.0 Bath Heparin none L thigh AVG Calcitriol 0.5 ug TIW Recent Labs: TStat: 24 P 4.5 (12/15), Hgb 11.6 (1/21)  Assessment/Plan: 1. Chest pain / Dyspnea - Trop 0.02->0.03, BNP 1902, EKG sinus rhythm with prolonged PR interval, CXR no acute disease. Cath Nov '15, ECHO 02/20/14 EF 55%, SVT 02/20/14. Improved after HD and fluid removal. Likely 2/2 to fluid overload. On Azithromycin.   2. ESRD / Anion Gap Metabolic Acidosis - HD TTS. HD Sat, ran only 3 hrs due to snow. HD 1/26 for 4 hr. Net UF 3 L. HD today with UF goal of 3-5 L as tolerated.  3. Hyperkalemia - K+ 6.3->5.1 Albuterol and insulin treatment in ED. HD 1/26 on 2K bath. Monitor.   4. HTN/ Volume - BP 144/37, Hypotension during HD (82/45), Medication: Metoprolol. Wt 79.1 -> 78.5, wt 76.9 (1/15), EDW 76.5.  CXR no pulm edema, on PE does not appear overly volume overloaded.   5. Anemia - Hgb 10.1  6. Metabolic bone disease - Ca 7.8, P 4.4 (1/15), continue sensipar and calcitriol. Binder phoslo   7. Nutrition - Alb 3.1, diet renal, renal vit.   8. Liver Transplant - Immunosuppressive medications  9. DM2 - Primary, insulin   10. Dispo - If patient remain stable after HD today she will be cleared by nephrology to be discharged.   Yvone Neu PA student  03/09/2014 9:30 am   Pt seen, examined and agree w A/P as above. OK for dc today after HD. Have d/w primary.  [name] MD pager [phone]    cell [phone] 03/09/2014, 1:08 PM

## 2014-03-09 NOTE — Progress Notes (Signed)
Discharge instructions and medications discussed with patient.  Prescription given to patient.  Patient reminded to get INR checked Saturday with hemodialysis; also highlighted on discharge instructions.  All questions answered.

## 2014-03-10 ENCOUNTER — Ambulatory Visit: Payer: Medicare Other | Admitting: Cardiology

## 2014-03-10 LAB — GLUCOSE, CAPILLARY: Glucose-Capillary: 79 mg/dL (ref 70–99)

## 2014-03-11 DIAGNOSIS — I359 Nonrheumatic aortic valve disorder, unspecified: Secondary | ICD-10-CM | POA: Diagnosis not present

## 2014-03-11 DIAGNOSIS — E118 Type 2 diabetes mellitus with unspecified complications: Secondary | ICD-10-CM | POA: Diagnosis not present

## 2014-03-12 DIAGNOSIS — I10 Essential (primary) hypertension: Secondary | ICD-10-CM | POA: Diagnosis not present

## 2014-03-12 DIAGNOSIS — E119 Type 2 diabetes mellitus without complications: Secondary | ICD-10-CM | POA: Diagnosis not present

## 2014-03-12 DIAGNOSIS — Z952 Presence of prosthetic heart valve: Secondary | ICD-10-CM | POA: Diagnosis not present

## 2014-03-12 DIAGNOSIS — Z944 Liver transplant status: Secondary | ICD-10-CM | POA: Diagnosis not present

## 2014-03-12 DIAGNOSIS — I9589 Other hypotension: Secondary | ICD-10-CM | POA: Diagnosis not present

## 2014-03-12 DIAGNOSIS — R791 Abnormal coagulation profile: Secondary | ICD-10-CM | POA: Diagnosis not present

## 2014-03-12 DIAGNOSIS — Z992 Dependence on renal dialysis: Secondary | ICD-10-CM | POA: Diagnosis not present

## 2014-03-12 DIAGNOSIS — E875 Hyperkalemia: Secondary | ICD-10-CM | POA: Diagnosis not present

## 2014-03-12 DIAGNOSIS — I120 Hypertensive chronic kidney disease with stage 5 chronic kidney disease or end stage renal disease: Secondary | ICD-10-CM | POA: Diagnosis not present

## 2014-03-12 DIAGNOSIS — F17210 Nicotine dependence, cigarettes, uncomplicated: Secondary | ICD-10-CM | POA: Diagnosis present

## 2014-03-12 DIAGNOSIS — H547 Unspecified visual loss: Secondary | ICD-10-CM | POA: Diagnosis present

## 2014-03-12 DIAGNOSIS — S0083XA Contusion of other part of head, initial encounter: Secondary | ICD-10-CM | POA: Diagnosis not present

## 2014-03-12 DIAGNOSIS — H05231 Hemorrhage of right orbit: Secondary | ICD-10-CM | POA: Diagnosis not present

## 2014-03-12 DIAGNOSIS — E039 Hypothyroidism, unspecified: Secondary | ICD-10-CM | POA: Diagnosis not present

## 2014-03-12 DIAGNOSIS — I429 Cardiomyopathy, unspecified: Secondary | ICD-10-CM | POA: Diagnosis not present

## 2014-03-12 DIAGNOSIS — I959 Hypotension, unspecified: Secondary | ICD-10-CM | POA: Diagnosis not present

## 2014-03-12 DIAGNOSIS — D649 Anemia, unspecified: Secondary | ICD-10-CM | POA: Diagnosis not present

## 2014-03-12 DIAGNOSIS — S023XXA Fracture of orbital floor, initial encounter for closed fracture: Secondary | ICD-10-CM | POA: Diagnosis not present

## 2014-03-12 DIAGNOSIS — N186 End stage renal disease: Secondary | ICD-10-CM | POA: Diagnosis present

## 2014-03-12 DIAGNOSIS — Z7901 Long term (current) use of anticoagulants: Secondary | ICD-10-CM | POA: Diagnosis not present

## 2014-03-12 DIAGNOSIS — R112 Nausea with vomiting, unspecified: Secondary | ICD-10-CM | POA: Diagnosis not present

## 2014-03-12 DIAGNOSIS — Z794 Long term (current) use of insulin: Secondary | ICD-10-CM | POA: Diagnosis not present

## 2014-03-12 DIAGNOSIS — R51 Headache: Secondary | ICD-10-CM | POA: Diagnosis not present

## 2014-03-12 DIAGNOSIS — S0591XA Unspecified injury of right eye and orbit, initial encounter: Secondary | ICD-10-CM | POA: Diagnosis not present

## 2014-03-12 DIAGNOSIS — H11421 Conjunctival edema, right eye: Secondary | ICD-10-CM | POA: Diagnosis present

## 2014-03-12 DIAGNOSIS — E11311 Type 2 diabetes mellitus with unspecified diabetic retinopathy with macular edema: Secondary | ICD-10-CM | POA: Diagnosis present

## 2014-03-12 DIAGNOSIS — Z7982 Long term (current) use of aspirin: Secondary | ICD-10-CM | POA: Diagnosis not present

## 2014-03-12 DIAGNOSIS — D631 Anemia in chronic kidney disease: Secondary | ICD-10-CM | POA: Diagnosis present

## 2014-03-12 DIAGNOSIS — S0511XA Contusion of eyeball and orbital tissues, right eye, initial encounter: Secondary | ICD-10-CM | POA: Diagnosis not present

## 2014-03-12 DIAGNOSIS — S0219XA Other fracture of base of skull, initial encounter for closed fracture: Secondary | ICD-10-CM | POA: Diagnosis not present

## 2014-03-12 DIAGNOSIS — H1131 Conjunctival hemorrhage, right eye: Secondary | ICD-10-CM | POA: Diagnosis not present

## 2014-03-12 DIAGNOSIS — S0990XA Unspecified injury of head, initial encounter: Secondary | ICD-10-CM | POA: Diagnosis not present

## 2014-03-12 DIAGNOSIS — S028XXA Fractures of other specified skull and facial bones, initial encounter for closed fracture: Secondary | ICD-10-CM | POA: Diagnosis present

## 2014-03-12 DIAGNOSIS — I252 Old myocardial infarction: Secondary | ICD-10-CM | POA: Diagnosis not present

## 2014-03-12 DIAGNOSIS — H0520 Unspecified exophthalmos: Secondary | ICD-10-CM | POA: Diagnosis present

## 2014-03-13 DIAGNOSIS — I252 Old myocardial infarction: Secondary | ICD-10-CM | POA: Diagnosis not present

## 2014-03-13 DIAGNOSIS — Z992 Dependence on renal dialysis: Secondary | ICD-10-CM | POA: Diagnosis not present

## 2014-03-13 DIAGNOSIS — D649 Anemia, unspecified: Secondary | ICD-10-CM | POA: Diagnosis not present

## 2014-03-13 DIAGNOSIS — R112 Nausea with vomiting, unspecified: Secondary | ICD-10-CM | POA: Diagnosis not present

## 2014-03-13 DIAGNOSIS — N186 End stage renal disease: Secondary | ICD-10-CM | POA: Diagnosis not present

## 2014-03-13 DIAGNOSIS — I120 Hypertensive chronic kidney disease with stage 5 chronic kidney disease or end stage renal disease: Secondary | ICD-10-CM | POA: Diagnosis not present

## 2014-03-13 DIAGNOSIS — E875 Hyperkalemia: Secondary | ICD-10-CM | POA: Diagnosis not present

## 2014-03-13 DIAGNOSIS — E039 Hypothyroidism, unspecified: Secondary | ICD-10-CM | POA: Diagnosis not present

## 2014-03-13 DIAGNOSIS — H05231 Hemorrhage of right orbit: Secondary | ICD-10-CM | POA: Diagnosis not present

## 2014-03-14 ENCOUNTER — Telehealth: Payer: Self-pay | Admitting: Pharmacist Clinician (PhC)/ Clinical Pharmacy Specialist

## 2014-03-14 NOTE — Telephone Encounter (Signed)
Dtr called to report patient is currently at [facility].  Had a fall at home, hit her head just at corner of eye on dresser.  Has blood pooled behind the eye.  Not sure when she will be discharged.  Asked dtr to have [facility] send records to Dr. [name] on discharge.    Pt has had multiple bleeding problems with dialysis fistula since starting warfarin, has mechanical aortic valve.

## 2014-03-18 DIAGNOSIS — D631 Anemia in chronic kidney disease: Secondary | ICD-10-CM | POA: Diagnosis not present

## 2014-03-18 DIAGNOSIS — E118 Type 2 diabetes mellitus with unspecified complications: Secondary | ICD-10-CM | POA: Diagnosis not present

## 2014-03-18 DIAGNOSIS — N2581 Secondary hyperparathyroidism of renal origin: Secondary | ICD-10-CM | POA: Diagnosis not present

## 2014-03-18 DIAGNOSIS — N186 End stage renal disease: Secondary | ICD-10-CM | POA: Diagnosis not present

## 2014-03-19 ENCOUNTER — Observation Stay (HOSPITAL_COMMUNITY)
Admission: AD | Admit: 2014-03-19 | Discharge: 2014-03-20 | Disposition: A | Discharge status: Home / Self Care | Payer: Medicare Other | Source: Other Acute Inpatient Hospital | Attending: Internal Medicine | Admitting: Internal Medicine

## 2014-03-19 DIAGNOSIS — I739 Peripheral vascular disease, unspecified: Secondary | ICD-10-CM | POA: Diagnosis not present

## 2014-03-19 DIAGNOSIS — Z8619 Personal history of other infectious and parasitic diseases: Secondary | ICD-10-CM | POA: Insufficient documentation

## 2014-03-19 DIAGNOSIS — Z886 Allergy status to analgesic agent status: Secondary | ICD-10-CM | POA: Insufficient documentation

## 2014-03-19 DIAGNOSIS — J449 Chronic obstructive pulmonary disease, unspecified: Secondary | ICD-10-CM | POA: Diagnosis not present

## 2014-03-19 DIAGNOSIS — M549 Dorsalgia, unspecified: Secondary | ICD-10-CM | POA: Diagnosis not present

## 2014-03-19 DIAGNOSIS — I249 Acute ischemic heart disease, unspecified: Secondary | ICD-10-CM | POA: Diagnosis not present

## 2014-03-19 DIAGNOSIS — Z794 Long term (current) use of insulin: Secondary | ICD-10-CM | POA: Insufficient documentation

## 2014-03-19 DIAGNOSIS — F329 Major depressive disorder, single episode, unspecified: Secondary | ICD-10-CM | POA: Insufficient documentation

## 2014-03-19 DIAGNOSIS — N186 End stage renal disease: Secondary | ICD-10-CM | POA: Insufficient documentation

## 2014-03-19 DIAGNOSIS — R002 Palpitations: Secondary | ICD-10-CM | POA: Diagnosis not present

## 2014-03-19 DIAGNOSIS — Z7982 Long term (current) use of aspirin: Secondary | ICD-10-CM | POA: Insufficient documentation

## 2014-03-19 DIAGNOSIS — I350 Nonrheumatic aortic (valve) stenosis: Secondary | ICD-10-CM | POA: Diagnosis not present

## 2014-03-19 DIAGNOSIS — K219 Gastro-esophageal reflux disease without esophagitis: Secondary | ICD-10-CM | POA: Insufficient documentation

## 2014-03-19 DIAGNOSIS — E039 Hypothyroidism, unspecified: Secondary | ICD-10-CM | POA: Insufficient documentation

## 2014-03-19 DIAGNOSIS — G8929 Other chronic pain: Secondary | ICD-10-CM | POA: Diagnosis not present

## 2014-03-19 DIAGNOSIS — E1165 Type 2 diabetes mellitus with hyperglycemia: Secondary | ICD-10-CM

## 2014-03-19 DIAGNOSIS — I2510 Atherosclerotic heart disease of native coronary artery without angina pectoris: Secondary | ICD-10-CM | POA: Insufficient documentation

## 2014-03-19 DIAGNOSIS — F17210 Nicotine dependence, cigarettes, uncomplicated: Secondary | ICD-10-CM | POA: Diagnosis not present

## 2014-03-19 DIAGNOSIS — F419 Anxiety disorder, unspecified: Secondary | ICD-10-CM | POA: Insufficient documentation

## 2014-03-19 DIAGNOSIS — E119 Type 2 diabetes mellitus without complications: Secondary | ICD-10-CM | POA: Diagnosis not present

## 2014-03-19 DIAGNOSIS — Z951 Presence of aortocoronary bypass graft: Secondary | ICD-10-CM | POA: Diagnosis not present

## 2014-03-19 DIAGNOSIS — Z944 Liver transplant status: Secondary | ICD-10-CM | POA: Diagnosis not present

## 2014-03-19 DIAGNOSIS — Z79899 Other long term (current) drug therapy: Secondary | ICD-10-CM | POA: Insufficient documentation

## 2014-03-19 DIAGNOSIS — I471 Supraventricular tachycardia: Secondary | ICD-10-CM | POA: Diagnosis not present

## 2014-03-19 DIAGNOSIS — I200 Unstable angina: Secondary | ICD-10-CM | POA: Diagnosis not present

## 2014-03-19 DIAGNOSIS — Z954 Presence of other heart-valve replacement: Secondary | ICD-10-CM | POA: Diagnosis not present

## 2014-03-19 DIAGNOSIS — K5900 Constipation, unspecified: Secondary | ICD-10-CM | POA: Diagnosis not present

## 2014-03-19 DIAGNOSIS — R079 Chest pain, unspecified: Principal | ICD-10-CM | POA: Insufficient documentation

## 2014-03-19 DIAGNOSIS — Z7901 Long term (current) use of anticoagulants: Secondary | ICD-10-CM | POA: Insufficient documentation

## 2014-03-19 DIAGNOSIS — E669 Obesity, unspecified: Secondary | ICD-10-CM | POA: Insufficient documentation

## 2014-03-19 DIAGNOSIS — I120 Hypertensive chronic kidney disease with stage 5 chronic kidney disease or end stage renal disease: Secondary | ICD-10-CM | POA: Diagnosis not present

## 2014-03-19 DIAGNOSIS — Z952 Presence of prosthetic heart valve: Secondary | ICD-10-CM | POA: Diagnosis not present

## 2014-03-19 DIAGNOSIS — D649 Anemia, unspecified: Secondary | ICD-10-CM | POA: Diagnosis not present

## 2014-03-19 DIAGNOSIS — Z992 Dependence on renal dialysis: Secondary | ICD-10-CM | POA: Insufficient documentation

## 2014-03-19 DIAGNOSIS — E780 Pure hypercholesterolemia: Secondary | ICD-10-CM | POA: Diagnosis not present

## 2014-03-19 DIAGNOSIS — I509 Heart failure, unspecified: Secondary | ICD-10-CM | POA: Diagnosis not present

## 2014-03-19 DIAGNOSIS — J45909 Unspecified asthma, uncomplicated: Secondary | ICD-10-CM | POA: Insufficient documentation

## 2014-03-19 DIAGNOSIS — Z885 Allergy status to narcotic agent status: Secondary | ICD-10-CM | POA: Diagnosis not present

## 2014-03-19 DIAGNOSIS — R072 Precordial pain: Secondary | ICD-10-CM | POA: Diagnosis not present

## 2014-03-19 LAB — CBC
HCT: 30.1 % — ABNORMAL LOW (ref 36.0–46.0)
Hemoglobin: 9.6 g/dL — ABNORMAL LOW (ref 12.0–15.0)
MCH: 31.3 pg (ref 26.0–34.0)
MCHC: 31.9 g/dL (ref 30.0–36.0)
MCV: 98 fL (ref 78.0–100.0)
Platelets: 72 10*3/uL — ABNORMAL LOW (ref 150–400)
RBC: 3.07 MIL/uL — ABNORMAL LOW (ref 3.87–5.11)
RDW: 15.1 % (ref 11.5–15.5)
WBC: 5 10*3/uL (ref 4.0–10.5)

## 2014-03-19 LAB — PROTIME-INR
INR: 2.08 — AB (ref 0.00–1.49)
PROTHROMBIN TIME: 23.6 s — AB (ref 11.6–15.2)

## 2014-03-19 LAB — RENAL FUNCTION PANEL
ANION GAP: 9 (ref 5–15)
Albumin: 2.8 g/dL — ABNORMAL LOW (ref 3.5–5.2)
BUN: 19 mg/dL (ref 6–23)
CO2: 31 mmol/L (ref 19–32)
Calcium: 8.6 mg/dL (ref 8.4–10.5)
Chloride: 101 mmol/L (ref 96–112)
Creatinine, Ser: 6.15 mg/dL — ABNORMAL HIGH (ref 0.50–1.10)
GFR calc Af Amer: 8 mL/min — ABNORMAL LOW (ref >90)
GFR, EST NON AFRICAN AMERICAN: 7 mL/min — AB (ref >90)
GLUCOSE: 101 mg/dL — AB (ref 70–99)
Phosphorus: 4.9 mg/dL — ABNORMAL HIGH (ref 2.3–4.6)
Potassium: 4.7 mmol/L (ref 3.5–5.1)
SODIUM: 141 mmol/L (ref 135–145)

## 2014-03-19 LAB — GLUCOSE, CAPILLARY
Glucose-Capillary: 135 mg/dL — ABNORMAL HIGH (ref 70–99)
Glucose-Capillary: 96 mg/dL (ref 70–99)

## 2014-03-19 LAB — TROPONIN I
TROPONIN I: 0.04 ng/mL — AB (ref <0.031)
TROPONIN I: 0.04 ng/mL — AB (ref <0.031)

## 2014-03-19 MED ORDER — DORZOLAMIDE HCL-TIMOLOL MAL 2-0.5 % OP SOLN
1.0000 [drp] | Freq: Two times a day (BID) | OPHTHALMIC | Status: DC
  Administered 2014-03-20: 1 [drp] via OPHTHALMIC
  Filled 2014-03-19: qty 10

## 2014-03-19 MED ORDER — CINACALCET HCL 30 MG PO TABS
30.0000 mg | ORAL_TABLET | Freq: Every day | ORAL | Status: DC
  Administered 2014-03-19 – 2014-03-20 (×2): 30 mg via ORAL
  Filled 2014-03-19 (×2): qty 1

## 2014-03-19 MED ORDER — METOPROLOL TARTRATE 50 MG PO TABS
50.0000 mg | ORAL_TABLET | Freq: Two times a day (BID) | ORAL | Status: DC
  Administered 2014-03-19 – 2014-03-20 (×3): 50 mg via ORAL
  Filled 2014-03-19 (×4): qty 1

## 2014-03-19 MED ORDER — CALCIUM CARBONATE ANTACID 500 MG PO CHEW
2.0000 | CHEWABLE_TABLET | Freq: Every day | ORAL | Status: DC | PRN
  Filled 2014-03-19: qty 2

## 2014-03-19 MED ORDER — WARFARIN SODIUM 4 MG PO TABS
4.0000 mg | ORAL_TABLET | Freq: Once | ORAL | Status: AC
  Administered 2014-03-19: 4 mg via ORAL
  Filled 2014-03-19: qty 1

## 2014-03-19 MED ORDER — LORAZEPAM 0.5 MG PO TABS: 0.5000 mg | ORAL_TABLET | Freq: Every evening | ORAL | Status: DC | PRN

## 2014-03-19 MED ORDER — POLYETHYLENE GLYCOL 3350 17 G PO PACK
17.0000 g | PACK | Freq: Every day | ORAL | Status: DC | PRN
  Administered 2014-03-20: 17 g via ORAL
  Filled 2014-03-19 (×2): qty 1

## 2014-03-19 MED ORDER — ARTIFICIAL TEARS OP OINT
TOPICAL_OINTMENT | Freq: Two times a day (BID) | OPHTHALMIC | Status: DC
  Administered 2014-03-20: 10:00:00 via OPHTHALMIC
  Filled 2014-03-19: qty 3.5

## 2014-03-19 MED ORDER — ALBUTEROL SULFATE (2.5 MG/3ML) 0.083% IN NEBU: 2.5000 mg | INHALATION_SOLUTION | RESPIRATORY_TRACT | Status: DC | PRN

## 2014-03-19 MED ORDER — ALPRAZOLAM 0.25 MG PO TABS
0.2500 mg | ORAL_TABLET | Freq: Two times a day (BID) | ORAL | Status: DC | PRN
  Administered 2014-03-19: 0.25 mg via ORAL
  Filled 2014-03-19: qty 1

## 2014-03-19 MED ORDER — TACROLIMUS 1 MG PO CAPS
3.0000 mg | ORAL_CAPSULE | Freq: Two times a day (BID) | ORAL | Status: DC
  Administered 2014-03-19 – 2014-03-20 (×3): 3 mg via ORAL
  Filled 2014-03-19 (×4): qty 3

## 2014-03-19 MED ORDER — MORPHINE SULFATE 2 MG/ML IJ SOLN
2.0000 mg | INTRAMUSCULAR | Status: DC | PRN
  Administered 2014-03-19 – 2014-03-20 (×2): 2 mg via INTRAVENOUS
  Filled 2014-03-19 (×2): qty 1

## 2014-03-19 MED ORDER — WARFARIN - PHARMACIST DOSING INPATIENT
Freq: Every day | Status: DC
Start: 2014-03-19 — End: 2014-03-20

## 2014-03-19 MED ORDER — ASPIRIN EC 81 MG PO TBEC
81.0000 mg | DELAYED_RELEASE_TABLET | Freq: Every day | ORAL | Status: DC
  Administered 2014-03-19 – 2014-03-20 (×2): 81 mg via ORAL
  Filled 2014-03-19 (×2): qty 1

## 2014-03-19 MED ORDER — ONDANSETRON HCL 4 MG/2ML IJ SOLN: 4.0000 mg | Freq: Four times a day (QID) | INTRAMUSCULAR | Status: DC | PRN

## 2014-03-19 MED ORDER — GI COCKTAIL ~~LOC~~
30.0000 mL | Freq: Four times a day (QID) | ORAL | Status: DC | PRN
  Filled 2014-03-19: qty 30

## 2014-03-19 MED ORDER — TRAMADOL HCL 50 MG PO TABS
50.0000 mg | ORAL_TABLET | Freq: Two times a day (BID) | ORAL | Status: DC | PRN
  Administered 2014-03-19: 50 mg via ORAL
  Filled 2014-03-19: qty 1

## 2014-03-19 MED ORDER — ACETAMINOPHEN 325 MG PO TABS
650.0000 mg | ORAL_TABLET | ORAL | Status: DC | PRN
  Filled 2014-03-19: qty 2

## 2014-03-19 MED ORDER — LEVOTHYROXINE SODIUM 150 MCG PO TABS
150.0000 ug | ORAL_TABLET | Freq: Every day | ORAL | Status: DC
  Administered 2014-03-20: 150 ug via ORAL
  Filled 2014-03-19 (×2): qty 1

## 2014-03-19 MED ORDER — GABAPENTIN 300 MG PO CAPS
300.0000 mg | ORAL_CAPSULE | Freq: Every day | ORAL | Status: DC
  Administered 2014-03-19: 300 mg via ORAL
  Filled 2014-03-19 (×2): qty 1

## 2014-03-19 MED ORDER — INSULIN ASPART 100 UNIT/ML ~~LOC~~ SOLN
0.0000 [IU] | Freq: Three times a day (TID) | SUBCUTANEOUS | Status: DC
  Administered 2014-03-19: 1 [IU] via SUBCUTANEOUS
  Administered 2014-03-20: 3 [IU] via SUBCUTANEOUS

## 2014-03-19 MED ORDER — WARFARIN SODIUM 4 MG PO TABS: 4.0000 mg | ORAL_TABLET | Freq: Every day | ORAL | Status: DC

## 2014-03-19 NOTE — H&P (Signed)
PATIENT DETAILS Name: [name] Age: 59 y.o. Sex: female Date of Birth: August 22, 1955 Admit Date: 03/19/2014 PCP:No primary care provider on file.   CHIEF COMPLAINT:  Chest pain  HPI: [name] is a 59 y.o. female with a Past Medical History of end-stage renal disease on hemodialysis, history of CAD status post CABG, history of aortic stenosis status post  mechanical aortic valve placement, history of hep C with liver transplantation on chronic tacrolimus therapy who presents today with the above noted complaint. Per patient, she was in her usual state of health, she woke up around 6 AM this morning with left-sided chest pain and palpitations. She describes the chest pain as pressure-like, lasted approximately 15-20 minutes and was relieved by sublingual nitroglycerin. It was no associated shortness of breath, nausea vomiting. She was subsequently brought to [facility] emergency room, and the hospitalist service here at [facility] was called to transfer the patient because of dialysis needs. Patient denies any headache, fever, nausea, vomiting or diarrhea. During my evaluation, patient was lying comfortably with no distress, she was no longer having any chest pain or palpitations that she presented with.    ALLERGIES:   Allergies  Allergen Reactions  . Acetaminophen Other (See Comments)    Liver transplant recipient   . Codeine Itching  . Mirtazapine Other (See Comments)    hallucination  . Morphine Itching    PAST MEDICAL HISTORY: Past Medical History  Diagnosis Date  . 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 Chronic back pain   . CAD (coronary artery disease)   . Obesity   . Peripheral vascular disease hands and legs  . Anxiety   . Asthma   . GERD (gastroesophageal reflux disease)     takes Omeprazole daily  . Chronic constipation     takes MIralax and Colace daily    . Anemia     takes Folic Acid daily  . Hypothyroidism     takes Synthroid daily  . Depression     takes Cymbalta for "severe" depression  . Neuromuscular disorder     carpal tunnel in right hand  . Hypertension     takes Metoprolol and Lisinopril daily, sees Dr Bea Graff  . COPD (chronic obstructive pulmonary disease)   . Pneumonia     "today and several times before" (08/30/2012)  . Chronic bronchitis     "q yr w/season changes" (08/30/2012)  . Type II diabetes mellitus     Levemir 2units daily if > 150  . History of blood transfusion     "several" (08/30/2012)  . Hepatitis C   . Migraine     "last migraine was in 2013" (08/30/2012)  . Headache     "at least monthly" (08/30/2012)  . Arthritis     "left hand, back" (08/30/2012)  . End stage renal disease on dialysis 02/27/2011    Kidneys shut down at time of liver transplant in Sept 2011 at [facility] in [place], she has been on HD ever since.  Dialyzes at [facility] HD on TTS schedule.  Had L forearm graft used 10 months then removed Dec 2012 due to suspected infection.  A right upper arm AVG was placed Dec 2012 but she developed steal symptoms acutely and it was ligated the same day.  Never had an AV fistula due to small  veins.  Now has L thigh AVG put in Jan 2013, has not clotted to date.    [name] CAD (coronary artery disease) Jan. 2015    Cath: 20% LAD, 50% D1; s/p LIMA-LAD  . Aortic stenosis   . Tobacco abuse   . S/P aortic valve replacement 03/15/13    Mechanical     PAST SURGICAL HISTORY: Past Surgical History  Procedure Laterality Date  . Liver transplant  10/25/2009    sees Dr Ferol Luz 1 every 6 months, saw last in Dec 2013. [name] Coord [phone]  . Small intestine surgery  90's  . Thrombectomy    . Arteriovenous graft placement Left 10/03/10     forearm  . Avgg removal  12/23/2010    Procedure: REMOVAL OF ARTERIOVENOUS GORETEX GRAFT ([place]);  Surgeon: Elam Dutch, MD;  Location: [place];  Service: Vascular;   Laterality: Left;  procedure started @1736 -1852  . Insertion of dialysis catheter  12/23/2010    Procedure: INSERTION OF DIALYSIS CATHETER;  Surgeon: Elam Dutch, MD;  Location: [place];  Service: Vascular;  Laterality: Right;  Right Internal Jugular 28cm dialysis catheter insertion procedure time 1701-1720   . Cholecystectomy  1993  . Cystoscopy  1990's  . Spinal growth rods  2010    "put 2 metal rods in my back; they had detetriorated" (08/30/2012)  . Av fistula placement  01/29/2011    Procedure: INSERTION OF ARTERIOVENOUS (AV) GORE-TEX GRAFT ARM;  Surgeon: Elam Dutch, MD;  Location: [place];  Service: Vascular;  Laterality: Right;  . Av fistula placement  03/10/2011    Procedure: INSERTION OF ARTERIOVENOUS (AV) GORE-TEX GRAFT THIGH;  Surgeon: Elam Dutch, MD;  Location: [place];  Service: Vascular;  Laterality: Left;  . Tubal ligation  1990's  . Cardiac catheterization  2014  . Aortic valve replacement N/A 03/15/2013    AVR; Surgeon: Ivin Poot, MD;  Location: [facility] OR; Open Heart Surgery;  68mm[employer] mechanical prosthesis, top hat valve  . Coronary artery bypass graft N/A 03/15/2013    Procedure: CORONARY ARTERY BYPASS GRAFTING (CABG) times one using left internal mammary artery.;  Surgeon: Ivin Poot, MD;  Location: [place];  Service: Open Heart Surgery;  Laterality: N/A;  POSS CABG X 1  . Intraoperative transesophageal echocardiogram N/A 03/15/2013    Procedure: INTRAOPERATIVE TRANSESOPHAGEAL ECHOCARDIOGRAM;  Surgeon: Ivin Poot, MD;  Location: [place];  Service: Open Heart Surgery;  Laterality: N/A;  . Left heart catheterization with coronary/graft angiogram N/A 12/24/2011    Procedure: LEFT HEART CATHETERIZATION WITH Beatrix Fetters;  Surgeon: [name], MD;  Location: [facility] CATH LAB;  Service: Cardiovascular;  Laterality: N/A;  . Left heart catheterization with coronary angiogram N/A 07/29/2012    Procedure: LEFT HEART CATHETERIZATION WITH CORONARY  ANGIOGRAM;  Surgeon: Troy Sine, MD;  Location: [facility] CATH LAB;  Service: Cardiovascular;  Laterality: N/A;  . Left heart catheterization with coronary angiogram N/A 03/10/2013    Procedure: LEFT HEART CATHETERIZATION WITH CORONARY ANGIOGRAM;  Surgeon: Troy Sine, MD;  Location: [facility] CATH LAB;  Service: Cardiovascular;  Laterality: N/A;  . Left heart catheterization with coronary/graft angiogram N/A 12/16/2013    Procedure: LEFT HEART CATHETERIZATION WITH Beatrix Fetters;  Surgeon: Troy Sine, MD;  Location: [facility] CATH LAB;  Service: Cardiovascular;  Laterality: N/A;    MEDICATIONS AT HOME: Prior to Admission medications   Medication Sig Start Date End Date Taking? Authorizing Provider  albuterol (PROVENTIL,VENTOLIN) 90 MCG/ACT inhaler Inhale 1-2 puffs  into the lungs every 6 (six) hours as needed for wheezing or shortness of breath.     Historical Provider, MD  aspirin EC 81 MG tablet Take 81 mg by mouth daily.      Historical Provider, MD  azithromycin (ZITHROMAX) 500 MG tablet Take 1 tablet (500 mg total) by mouth daily. TAKE FOR 3 DAYS THEN STOP. 03/09/14   Eugenie Filler, MD  calcium acetate (PHOSLO) 667 MG capsule Take 2,001 mg by mouth 3 (three) times daily with meals.    Historical Provider, MD  calcium carbonate (TUMS - DOSED IN MG ELEMENTAL CALCIUM) 500 MG chewable tablet Chew 2 tablets by mouth daily as needed for indigestion or heartburn.     Historical Provider, MD  cinacalcet (SENSIPAR) 30 MG tablet Take 30 mg by mouth daily.    Historical Provider, MD  gabapentin (NEURONTIN) 300 MG capsule Take 300 mg by mouth at bedtime.     Historical Provider, MD  insulin detemir (LEVEMIR) 100 UNIT/ML injection Inject 1-2 Units into the skin daily as needed (blood). 150-200 do 2 units >200 do 5 units    Historical Provider, MD  levothyroxine (SYNTHROID, LEVOTHROID) 150 MCG tablet Take 150 mcg by mouth daily before breakfast.    Historical Provider, MD  LORazepam (ATIVAN) 0.5 MG  tablet Take 0.5 mg by mouth at bedtime as needed for sleep.  02/08/13   Historical Provider, MD  metoprolol (LOPRESSOR) 50 MG tablet On non dialysis days take 1 & 1/2 tablet in the morning and 1 tablet in the evening. On dialysis days take 1 tablet twice daily Patient taking differently: Take 50 mg by mouth 2 (two) times daily.  12/28/13   Troy Sine, MD  Multiple Vitamin (MULTIVITAMIN WITH MINERALS) TABS tablet Take 1 tablet by mouth at bedtime.     Historical Provider, MD  ondansetron (ZOFRAN-ODT) 4 MG disintegrating tablet Take 4 mg by mouth every 8 (eight) hours as needed for nausea or vomiting.    Historical Provider, MD  polyethylene glycol (MIRALAX / GLYCOLAX) packet Take 17 g by mouth daily as needed for mild constipation.     Historical Provider, MD  promethazine (PHENERGAN) 12.5 MG tablet Take 12.5 mg by mouth every 4 (four) hours as needed for nausea.  02/09/13   Historical Provider, MD  tacrolimus (PROGRAF) 1 MG capsule Take 3 mg by mouth 2 (two) times daily.     Historical Provider, MD  traMADol (ULTRAM) 50 MG tablet Take 50 mg by mouth every 12 (twelve) hours as needed for moderate pain.  02/09/13   Historical Provider, MD  warfarin (COUMADIN) 4 MG tablet Take 1 tablet (4 mg total) by mouth daily. 02/24/14   Osa Craver, MD    FAMILY HISTORY: Family History  Problem Relation Age of Onset  . Cancer Mother   . Diabetes Mother   . Hypertension Mother   . Stroke Mother   . Cancer Father   . Anesthesia problems Neg Hx   . Hypotension Neg Hx   . Malignant hyperthermia Neg Hx   . Pseudochol deficiency Neg Hx     SOCIAL HISTORY:  reports that she has been smoking Cigarettes.  She has a 30 pack-year smoking history. She has never used smokeless tobacco. She reports that she drinks alcohol. She reports that she does not use illicit drugs.  REVIEW OF SYSTEMS:  Constitutional:   No  weight loss, night sweats,  Fevers, chills, fatigue.  HEENT:    No headaches, Difficulty  swallowing,Tooth/dental  problems,Sore throat,  Cardio-vascular: No chest pain,  Orthopnea, PND, swelling in lower extremities, anasarca,  dizziness, palpitations  GI:  No heartburn, indigestion, abdominal pain, nausea, vomiting, diarrhea, change in       bowel habits, loss of appetite  Resp: No shortness of breath with exertion or at rest.  No excess mucus, no productive cough, No non-productive cough,  No coughing up of blood.  Skin:  no rash or lesions.  GU:  no dysuria, change in color of urine, no urgency or frequency.  No flank pain.  Musculoskeletal: No joint pain or swelling.  No decreased range of motion.  No back pain.  Psych: No change in mood or affect. No depression or anxiety.  No memory loss.   PHYSICAL EXAM: Height 5\' 4"  (1.626 m).  General appearance :Awake, alert, not in any distress. Speech Clear. Not toxic Looking HEENT: Atraumatic and Normocephalic, pupils equally reactive to light and accomodation Neck: supple, no JVD. No cervical lymphadenopathy.  Chest:Good air entry bilaterally, no added sounds  CVS: S1 S2 regular, metallic click+  Abdomen: Bowel sounds present, Non tender and not distended with no gaurding, rigidity or rebound. Extremities: B/L Lower Ext shows no edema, both legs are warm to touch.dialysis access site present at left groin  Neurology: Awake alert, and oriented X 3, CN II-XII intact, Non focal Skin:No Rash Wounds:N/A  LABS ON ADMISSION:  No results for input(s): NA, K, CL, CO2, GLUCOSE, BUN, CREATININE, CALCIUM, MG, PHOS in the last 72 hours. No results for input(s): AST, ALT, ALKPHOS, BILITOT, PROT, ALBUMIN in the last 72 hours. No results for input(s): LIPASE, AMYLASE in the last 72 hours. No results for input(s): WBC, NEUTROABS, HGB, HCT, MCV, PLT in the last 72 hours. No results for input(s): CKTOTAL, CKMB, CKMBINDEX, TROPONINI in the last 72 hours. No results for input(s): DDIMER in the last 72 hours. Invalid input(s):  POCBNP   RADIOLOGIC STUDIES ON ADMISSION: No results found.   ASSESSMENT AND PLAN: Present on Admission:  . Chest pain: With both typical and atypical features, she has known history of CAD. She will be admitted to a telemetry unit, cardiac enzymes will be cycled. Her recent echocardiogram showed preserved ejection fraction without any regional wall motion abnormality. For now we will follow clinical course and she how she does. Cardiology will be consulted if necessary.  . Palpitations: Patient also complained of palpitations early this morning, she does have a history of paroxysmal SVT most recently in January 2016. She will be monitored in the telemetry unit and followed clinically. We will continue with metoprolol for now.  .ESRD on HD TTS: Will need to consult nephrology if she is still here on Tuesday. Currently appears stable. Await electrolytes.   . Hypothyroidism: Continue with levothyroxine  . History of liver transplant:patient has a long-standing history of hep C, underwent liver transplant in 2010. Continue with tacrolimus.   . Type 2 diabetes: Recent SSI, follow CBGs.  [name] History of aortic valve replacement: History of aortic valve replacement with mechanical valve, continue Coumadin. While inpatient will have pharmacy help with Coumadin dosing. Check INR.  COPD: Stable. Continue with as needed nebulized bronchodilators  Further plan will depend as patient's clinical course evolves and further radiologic and laboratory data become available. Patient will be monitored closely.  Above noted plan was discussed with patien, she was in agreement.   DVT Prophylaxis: Not needed as on coumadin  Code Status: Full Code   Disposition Plan:Home when stable   Total  time spent for admission equals 45 minutes.  [place] [facility] Pager [phone]  If 7PM-7AM, please contact night-coverage [email or web address] Password [facility] 03/19/2014, 12:31 PM

## 2014-03-19 NOTE — Progress Notes (Signed)
ANTICOAGULATION CONSULT NOTE - Initial Consult  Pharmacy Consult for Coumadin Indication: mechanical aortic valve  Allergies  Allergen Reactions  . Acetaminophen Other (See Comments)    Liver transplant recipient   . Codeine Itching  . Mirtazapine Other (See Comments)    hallucination  . Morphine Itching    Patient Measurements: Height: 5\' 4"  (162.6 cm) IBW/kg (Calculated) : 54.7 Heparin Dosing Weight: n/a  Vital Signs:    Labs:  Recent Labs  03/19/14 1400  HGB 9.6*  HCT 30.1*  PLT 72*  LABPROT 23.6*  INR 2.08*    Estimated Creatinine Clearance: 6.1 mL/min (by C-G formula based on Cr of 10.18).   Medical History: Past Medical History  Diagnosis Date  . 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 Chronic back pain   . CAD (coronary artery disease)   . Obesity   . Peripheral vascular disease hands and legs  . Anxiety   . Asthma   . GERD (gastroesophageal reflux disease)     takes Omeprazole daily  . Chronic constipation     takes MIralax and Colace daily  . Anemia     takes Folic Acid daily  . Hypothyroidism     takes Synthroid daily  . Depression     takes Cymbalta for "severe" depression  . Neuromuscular disorder     carpal tunnel in right hand  . Hypertension     takes Metoprolol and Lisinopril daily, sees Dr Bea Graff  . COPD (chronic obstructive pulmonary disease)   . Pneumonia     "today and several times before" (08/30/2012)  . Chronic bronchitis     "q yr w/season changes" (08/30/2012)  . Type II diabetes mellitus     Levemir 2units daily if > 150  . History of blood transfusion     "several" (08/30/2012)  . Hepatitis C   . Migraine     "last migraine was in 2013" (08/30/2012)  . Headache     "at least monthly" (08/30/2012)  . Arthritis     "left hand, back" (08/30/2012)  . End stage renal disease on dialysis 02/27/2011    Kidneys shut down at time of liver transplant in  Sept 2011 at [facility] in [place], she has been on HD ever since.  Dialyzes at [facility] HD on TTS schedule.  Had L forearm graft used 10 months then removed Dec 2012 due to suspected infection.  A right upper arm AVG was placed Dec 2012 but she developed steal symptoms acutely and it was ligated the same day.  Never had an AV fistula due to small veins.  Now has L thigh AVG put in Jan 2013, has not clotted to date.    [name] CAD (coronary artery disease) Jan. 2015    Cath: 20% LAD, 50% D1; s/p LIMA-LAD  . Aortic stenosis   . Tobacco abuse   . S/P aortic valve replacement 03/15/13    Mechanical     Medications:  Scheduled:  . aspirin EC  81 mg Oral Daily  . cinacalcet  30 mg Oral Daily  . gabapentin  300 mg Oral QHS  . insulin aspart  0-9 Units Subcutaneous TID WC  . [START ON 03/20/2014] levothyroxine  150 mcg Oral QAC breakfast  . metoprolol  50 mg Oral BID  . tacrolimus  3 mg Oral BID    Assessment: 59 yo female with hx  of ESRD, liver txp, and mechanical aortic valve admitted with chest pain.  Pharmacy asked to continue PTA Coumadin while she is here in hospital.  Per outpatient records, INR goal is 2.5-3.  PTA Coumadin dose was 4 mg daily, but she says they held it during her admission to [facility] last week due to her INR being > 4.  She did resume taking it Friday night (as directed), but thinks she did not take it last night due to feeling poorly.  Also reported some bleeding behind her eye after a fall ([place] admission), but she says they did not hold her Coumadin for this reason.  Today's INR below goal at 2.08  Goal of Therapy:  INR 2.5-3 Monitor platelets by anticoagulation protocol: Yes   Plan:  1. Coumadin 4 mg x 1 tonight. 2. Daily PT/INR.  Uvaldo Rising, BCPS  Clinical Pharmacist Pager [phone]  03/19/2014 2:57 PM

## 2014-03-19 NOTE — Patient Care Conference (Signed)
Discussed case with Dr. Terie Purser from [place]. 59yo female presented with severe chest pressure. Hx of CAD, ESRD on MWF HD, DM. Brought to [facility] ED. Initial EKG w/o changes. Was recently admitted for pulm edema, improved with HD. CXR without edema. Initial trop 0.04. Vitals stable. Reported chest pain improved with NTG. Patient accepted for chest pain r/o to med-tele, obs. Would consult Nephrology on arrival as pt is due for HD 2/8.

## 2014-03-20 ENCOUNTER — Encounter (HOSPITAL_COMMUNITY): Payer: Self-pay | Admitting: General Practice

## 2014-03-20 DIAGNOSIS — N186 End stage renal disease: Secondary | ICD-10-CM | POA: Diagnosis not present

## 2014-03-20 DIAGNOSIS — R079 Chest pain, unspecified: Secondary | ICD-10-CM | POA: Diagnosis not present

## 2014-03-20 DIAGNOSIS — Z8619 Personal history of other infectious and parasitic diseases: Secondary | ICD-10-CM | POA: Diagnosis not present

## 2014-03-20 DIAGNOSIS — I120 Hypertensive chronic kidney disease with stage 5 chronic kidney disease or end stage renal disease: Secondary | ICD-10-CM | POA: Diagnosis not present

## 2014-03-20 DIAGNOSIS — Z954 Presence of other heart-valve replacement: Secondary | ICD-10-CM | POA: Diagnosis not present

## 2014-03-20 DIAGNOSIS — I2510 Atherosclerotic heart disease of native coronary artery without angina pectoris: Secondary | ICD-10-CM | POA: Diagnosis not present

## 2014-03-20 DIAGNOSIS — Z992 Dependence on renal dialysis: Secondary | ICD-10-CM | POA: Diagnosis not present

## 2014-03-20 LAB — GLUCOSE, CAPILLARY: Glucose-Capillary: 104 mg/dL — ABNORMAL HIGH (ref 70–99)

## 2014-03-20 LAB — PROTIME-INR
INR: 2.21 — AB (ref 0.00–1.49)
Prothrombin Time: 24.7 seconds — ABNORMAL HIGH (ref 11.6–15.2)

## 2014-03-20 LAB — TROPONIN I: Troponin I: 0.04 ng/mL — ABNORMAL HIGH (ref <0.031)

## 2014-03-20 MED ORDER — WARFARIN SODIUM 4 MG PO TABS
4.0000 mg | ORAL_TABLET | Freq: Once | ORAL | Status: DC
  Filled 2014-03-20: qty 1

## 2014-03-20 MED ORDER — TRAMADOL HCL 50 MG PO TABS: 50.0000 mg | ORAL_TABLET | Freq: Two times a day (BID) | ORAL | Status: DC | PRN

## 2014-03-20 MED ORDER — NITROGLYCERIN 0.4 MG SL SUBL: 0.4000 mg | SUBLINGUAL_TABLET | SUBLINGUAL | Status: AC | PRN

## 2014-03-20 NOTE — Progress Notes (Signed)
UR completed 

## 2014-03-20 NOTE — Progress Notes (Signed)
Nurse Tech came out of room stating "pt having trouble breathing, entered room with pt laying in bed, a&ox3, c/o feeling faint, no pain noted, v/s taken and stable, blood sugar taken and noted to be 67. Apple and orange juice given and [place] sandwich with crackers and peanut butter given. Will recheck blood sugar. Cato Mulligan RN

## 2014-03-20 NOTE — Discharge Instructions (Signed)
Follow with Primary MD in 7 days   Get CBC, CMP, 2 view Chest X ray checked  by Primary MD next visit.    Activity: As tolerated with Full fall precautions use walker/cane & assistance as needed   Disposition Home    Diet: Renal dialysis diet with 1200 mL fluid restrictions, with feeding assistance and aspiration precautions as needed.  For Heart failure patients - Check your Weight same time everyday, if you gain over 2 pounds, or you develop in leg swelling, experience more shortness of breath or chest pain, call your Primary MD immediately. Follow Cardiac Low Salt Diet and 1.8 lit/day fluid restriction.   On your next visit with your primary care physician please Get Medicines reviewed and adjusted.   Please request your Prim.MD to go over all Hospital Tests and Procedure/Radiological results at the follow up, please get all Hospital records sent to your Prim MD by signing hospital release before you go home.   If you experience worsening of your admission symptoms, develop shortness of breath, life threatening emergency, suicidal or homicidal thoughts you must seek medical attention immediately by calling 911 or calling your MD immediately  if symptoms less severe.  You Must read complete instructions/literature along with all the possible adverse reactions/side effects for all the Medicines you take and that have been prescribed to you. Take any new Medicines after you have completely understood and accpet all the possible adverse reactions/side effects.   Do not drive, operating heavy machinery, perform activities at heights, swimming or participation in water activities or provide baby sitting services if your were admitted for syncope or siezures until you have seen by Primary MD or a Neurologist and advised to do so again.  Do not drive when taking Pain medications.    Do not take more than prescribed Pain, Sleep and Anxiety Medications  Special Instructions: If you have  smoked or chewed Tobacco  in the last 2 yrs please stop smoking, stop any regular Alcohol  and or any Recreational drug use.  Wear Seat belts while driving.   Please note  You were cared for by a hospitalist during your hospital stay. If you have any questions about your discharge medications or the care you received while you were in the hospital after you are discharged, you can call the unit and asked to speak with the hospitalist on call if the hospitalist that took care of you is not available. Once you are discharged, your primary care physician will handle any further medical issues. Please note that NO REFILLS for any discharge medications will be authorized once you are discharged, as it is imperative that you return to your primary care physician (or establish a relationship with a primary care physician if you do not have one) for your aftercare needs so that they can reassess your need for medications and monitor your lab values.

## 2014-03-20 NOTE — Consult Note (Signed)
Primary Physician: Primary Cardiologist:  [name]   HPI: Asked to see re CP  She has a history of CAD and aortic stenosis  Cath in Jan 2015 showed.  She underwent CABG (LIMA to LAD) and AVR in Feb 2015.  She was admitted soon after surgery with CP  R/O for MI  Sent home  In November 2015 presented with CP during dialysis.   She said she felt her racing and then developed chest prssure   Repeat cath showed:  Focal 70% mid LAD stenosis before LIMA LIMA patent.  RCA (dominant) was free of dz   and  LCx had 20% lesion   Lopressor was increased on nondialysis days   She was admitted on 01/02/14 with volume overload  Dialyzed  Echo showed LVEF 55 to 60%; AV prosthesis working well.   Patient also has a hx of ESRD,, hep C s/p Liver transplant,   Today, woke up with heart racing  Chest pressure  Lasted about 15 min  Went to [facility]  Given NTG with relief of symptoms  (did not have NTG to take at home  Ran out)  Tx here Currently denies CP  NO SOB  No palpitations  She was admitted to [facility] last week for eye problems  Says her metoprolol was decreased to 25 bid on discharge  Doesn't know why   Dialysis has been going OK       Past Medical History  Diagnosis Date  . 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 Chronic back pain   . CAD (coronary artery disease)   . Obesity   . Peripheral vascular disease hands and legs  . Anxiety   . Asthma   . GERD (gastroesophageal reflux disease)     takes Omeprazole daily  . Chronic constipation     takes MIralax and Colace daily  . Anemia     takes Folic Acid daily  . Hypothyroidism     takes Synthroid daily  . Depression     takes Cymbalta for "severe" depression  . Neuromuscular disorder     carpal tunnel in right hand  . Hypertension     takes Metoprolol and Lisinopril daily, sees Dr Bea Graff  . COPD (chronic obstructive pulmonary disease)   . Pneumonia       "today and several times before" (08/30/2012)  . Chronic bronchitis     "q yr w/season changes" (08/30/2012)  . Type II diabetes mellitus     Levemir 2units daily if > 150  . History of blood transfusion     "several" (08/30/2012)  . Hepatitis C   . Migraine     "last migraine was in 2013" (08/30/2012)  . Headache     "at least monthly" (08/30/2012)  . Arthritis     "left hand, back" (08/30/2012)  . End stage renal disease on dialysis 02/27/2011    Kidneys shut down at time of liver transplant in Sept 2011 at [facility] in [place], she has been on HD ever since.  Dialyzes at [facility] HD on TTS schedule.  Had L forearm graft used 10 months then removed Dec 2012 due to suspected infection.  A right upper arm AVG was placed Dec 2012 but she developed steal symptoms acutely and it was ligated the same day.  Never had an AV fistula due to small veins.  Now  has L thigh AVG put in Jan 2013, has not clotted to date.    [name] CAD (coronary artery disease) Jan. 2015    Cath: 20% LAD, 50% D1; s/p LIMA-LAD  . Aortic stenosis   . Tobacco abuse   . S/P aortic valve replacement 03/15/13    Mechanical     Medications Prior to Admission  Medication Sig Dispense Refill  . albuterol (PROVENTIL,VENTOLIN) 90 MCG/ACT inhaler Inhale 1-2 puffs into the lungs every 6 (six) hours as needed for wheezing or shortness of breath.     [name] artificial tears (LACRILUBE) OINT ophthalmic ointment Place 1 application into the right eye every 4 (four) hours. Apply under right eye lid after administering eye drops    . aspirin EC 81 MG tablet Take 81 mg by mouth daily.      . calcium acetate (PHOSLO) 667 MG capsule Take 667-2,001 mg by mouth See admin instructions. Take 3 capsules (2001 mg) with meals and 1 capsule (667 mg) with snacks    . cinacalcet (SENSIPAR) 30 MG tablet Take 30 mg by mouth daily.    . dorzolamide-timolol (COSOPT) 22.3-6.8 MG/ML ophthalmic solution Place 1 drop into the right eye every 4 (four) hours. Follow with  artificial tears ointment under eye lid    . gabapentin (NEURONTIN) 300 MG capsule Take 300 mg by mouth at bedtime.     . insulin aspart (NOVOLOG) 100 UNIT/ML injection Inject 1-3 Units into the skin 3 (three) times daily as needed for high blood sugar (CBG>150). Per sliding scale    . levothyroxine (SYNTHROID, LEVOTHROID) 150 MCG tablet Take 150 mcg by mouth daily before breakfast.    . magnesium oxide (MAG-OX) 400 MG tablet Take 400 mg by mouth daily.    . metoCLOPramide (REGLAN) 10 MG tablet Take 10 mg by mouth 3 (three) times daily before meals.    . metoprolol (LOPRESSOR) 50 MG tablet On non dialysis days take 1 & 1/2 tablet in the morning and 1 tablet in the evening. On dialysis days take 1 tablet twice daily (Patient taking differently: Take 25 mg by mouth 2 (two) times daily. ) 180 tablet 3  . metoprolol tartrate (LOPRESSOR) 25 MG tablet Take 25 mg by mouth 2 (two) times daily.     . ondansetron (ZOFRAN-ODT) 4 MG disintegrating tablet Take 4 mg by mouth every 8 (eight) hours as needed for nausea or vomiting.    . tacrolimus (PROGRAF) 1 MG capsule Take 3 mg by mouth 2 (two) times daily.     [name] azithromycin (ZITHROMAX) 500 MG tablet Take 1 tablet (500 mg total) by mouth daily. TAKE FOR 3 DAYS THEN STOP. (Patient not taking: Reported on 03/19/2014) 3 tablet 0  . traMADol (ULTRAM) 50 MG tablet Take 50 mg by mouth every 12 (twelve) hours as needed for moderate pain.     [name] warfarin (COUMADIN) 4 MG tablet Take 1 tablet (4 mg total) by mouth daily. (Patient taking differently: Take 4 mg by mouth daily at 6 PM. ) 30 tablet 3     . artificial tears   Right Eye BID  . aspirin EC  81 mg Oral Daily  . cinacalcet  30 mg Oral Daily  . dorzolamide-timolol  1 drop Right Eye BID  . gabapentin  300 mg Oral QHS  . insulin aspart  0-9 Units Subcutaneous TID WC  . levothyroxine  150 mcg Oral QAC breakfast  . metoprolol  50 mg Oral BID  . tacrolimus  3 mg Oral  BID  . Warfarin - Pharmacist Dosing Inpatient    Does not apply q1800    Infusions:    Allergies  Allergen Reactions  . Acetaminophen Other (See Comments)    Liver transplant recipient   . Codeine Itching  . Mirtazapine Other (See Comments)    hallucination  . Morphine Itching    History   Social History  . Marital Status: Divorced    Spouse Name: N/A    Number of Children: N/A  . Years of Education: N/A   Occupational History  . Not on file.   Social History Main Topics  . Smoking status: Current Some Day Smoker -- 0.75 packs/day for 40 years    Types: Cigarettes    Last Attempt to Quit: 03/13/2013  . Smokeless tobacco: Never Used     Comment: still smokng 1 or 2 a day  . Alcohol Use: Yes     Comment: 08/30/2012 "last drink was at a wedding July, 2014; had a small glass of wine; never had problems w/alcohol"  . Drug Use: No  . Sexual Activity: Not Currently   Other Topics Concern  . Not on file   Social History Narrative    Family History  Problem Relation Age of Onset  . Cancer Mother   . Diabetes Mother   . Hypertension Mother   . Stroke Mother   . Cancer Father   . Anesthesia problems Neg Hx   . Hypotension Neg Hx   . Malignant hyperthermia Neg Hx   . Pseudochol deficiency Neg Hx     REVIEW OF SYSTEMS:  All systems reviewed  Negative to the above problem except as noted above.    PHYSICAL EXAM: Filed Vitals:   03/20/14 0515  BP: 121/34  Pulse: 63  Temp: 97.7 F (36.5 C)  Resp: 18     Intake/Output Summary (Last 24 hours) at 03/20/14 0946 Last data filed at 03/20/14 0827  Gross per 24 hour  Intake    120 ml  Output      0 ml  Net    120 ml    General:  Well appearing. No respiratory difficulty HEENT: normal Neck: supple. no JVD. Carotids 2+ bilat; no bruits. No lymphadenopathy or thryomegaly appreciated. Cor: PMI nondisplaced. Regular rate & rhythm. Crisp valve sounds   Lungs: clear Abdomen: soft, nontender, nondistended. No hepatosplenomegaly. No bruits or masses. Good bowel  sounds. Extremities: no cyanosis, clubbing, rash, edema Neuro: alert & oriented x 3, cranial nerves grossly intact. moves all 4 extremities w/o difficulty. Affect pleasant.  ECG:  SR 86  First degree AV block  PR interval 214 msec.  Sl ST depression, T wave inversion V5/V6, I, AVL (old)   Results for orders placed or performed during the hospital encounter of 03/19/14 (from the past 24 hour(s))  CBC     Status: Abnormal   Collection Time: 03/19/14  2:00 PM  Result Value Ref Range   WBC 5.0 4.0 - 10.5 K/uL   RBC 3.07 (L) 3.87 - 5.11 MIL/uL   Hemoglobin 9.6 (L) 12.0 - 15.0 g/dL   HCT 30.1 (L) 36.0 - 46.0 %   MCV 98.0 78.0 - 100.0 fL   MCH 31.3 26.0 - 34.0 pg   MCHC 31.9 30.0 - 36.0 g/dL   RDW 15.1 11.5 - 15.5 %   Platelets 72 (L) 150 - 400 K/uL  Renal function panel     Status: Abnormal   Collection Time: 03/19/14  2:00 PM  Result Value  Ref Range   Sodium 141 135 - 145 mmol/L   Potassium 4.7 3.5 - 5.1 mmol/L   Chloride 101 96 - 112 mmol/L   CO2 31 19 - 32 mmol/L   Glucose, Bld 101 (H) 70 - 99 mg/dL   BUN 19 6 - 23 mg/dL   Creatinine, Ser 6.15 (H) 0.50 - 1.10 mg/dL   Calcium 8.6 8.4 - 10.5 mg/dL   Phosphorus 4.9 (H) 2.3 - 4.6 mg/dL   Albumin 2.8 (L) 3.5 - 5.2 g/dL   GFR calc non Af Amer 7 (L) >90 mL/min   GFR calc Af Amer 8 (L) >90 mL/min   Anion gap 9 5 - 15  Protime-INR     Status: Abnormal   Collection Time: 03/19/14  2:00 PM  Result Value Ref Range   Prothrombin Time 23.6 (H) 11.6 - 15.2 seconds   INR 2.08 (H) 0.00 - 1.49  Troponin I (q 6hr x 3)     Status: Abnormal   Collection Time: 03/19/14  2:00 PM  Result Value Ref Range   Troponin I 0.04 (H) <0.031 ng/mL  Glucose, capillary     Status: Abnormal   Collection Time: 03/19/14  4:46 PM  Result Value Ref Range   Glucose-Capillary 135 (H) 70 - 99 mg/dL   Comment 1 Notify RN    Comment 2 Documented in Chart   Troponin I (q 6hr x 3)     Status: Abnormal   Collection Time: 03/19/14  6:13 PM  Result Value Ref Range    Troponin I 0.04 (H) <0.031 ng/mL  Glucose, capillary     Status: None   Collection Time: 03/19/14  9:23 PM  Result Value Ref Range   Glucose-Capillary 96 70 - 99 mg/dL   Comment 1 Notify RN   Troponin I (q 6hr x 3)     Status: Abnormal   Collection Time: 03/20/14 12:50 AM  Result Value Ref Range   Troponin I 0.04 (H) <0.031 ng/mL  Protime-INR     Status: Abnormal   Collection Time: 03/20/14 12:50 AM  Result Value Ref Range   Prothrombin Time 24.7 (H) 11.6 - 15.2 seconds   INR 2.21 (H) 0.00 - 1.49  Glucose, capillary     Status: Abnormal   Collection Time: 03/20/14  6:11 AM  Result Value Ref Range   Glucose-Capillary 104 (H) 70 - 99 mg/dL   No results found.   ASSESSMENT:  Pt is a 59 yo with history of CAD(s/p CABG) and AVR Now with heart racing and chest pressure No documented arrhythmia   Of note lopressor was decreased last week  Currently symptom free  No significant findings on exam or EKG (old changes) Labs without signif abnormality    I would recomm having the patient walk Keep on 50 bid of Lopressor  If does OK with this would d/c   I will organize for an event monitor (in case of an arrhythmia)  and outpatient f/u   Needs NTG refill    2.  CAD As noted above  3.  S/p AVR  Continue coumadin  4.  ESRD  Dialysis TThS    [name]

## 2014-03-20 NOTE — Discharge Summary (Signed)
[name], 59 y.o., DOB Jul 10, 1955, MRN [id number]. Admission date: 03/19/2014 Discharge Date 03/20/2014 Primary MD No primary care provider on file. Admitting Physician Donne Hazel, MD  Admission Diagnosis  CHEST PAIN   PCP please follow on:( patient is been followed mainly by nephrology and cardiology service, her INR is been followed and monitored by nephrology) - Please check CBC, BMP, INR during next visit, and adjust warfarin level as needed.  Discharge Diagnosis   Principal Problem:   Chest pain Active Problems:   Hypothyroidism   End stage renal disease on dialysis-TTS   S/P mechanical AVR Feb 2015   S/P CABG x 1, LIMA-LAD 03/2013   DM type 2 (diabetes mellitus, type 2)   S/P liver transplant    Past Medical History  Diagnosis Date  . 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 Chronic back pain   . CAD (coronary artery disease)   . Obesity   . Peripheral vascular disease hands and legs  . Anxiety   . Asthma   . GERD (gastroesophageal reflux disease)     takes Omeprazole daily  . Chronic constipation     takes MIralax and Colace daily  . Anemia     takes Folic Acid daily  . Hypothyroidism     takes Synthroid daily  . Depression     takes Cymbalta for "severe" depression  . Neuromuscular disorder     carpal tunnel in right hand  . Hypertension     takes Metoprolol and Lisinopril daily, sees Dr Bea Graff  . COPD (chronic obstructive pulmonary disease)   . Pneumonia     "today and several times before" (08/30/2012)  . Chronic bronchitis     "q yr w/season changes" (08/30/2012)  . Type II diabetes mellitus     Levemir 2units daily if > 150  . History of blood transfusion     "several" (08/30/2012)  . Hepatitis C   . Migraine     "last migraine was in 2013" (08/30/2012)  . Headache     "at least monthly" (08/30/2012)  . Arthritis     "left hand, back" (08/30/2012)  . End stage renal  disease on dialysis 02/27/2011    Kidneys shut down at time of liver transplant in Sept 2011 at [facility] in [place], she has been on HD ever since.  Dialyzes at [facility] HD on TTS schedule.  Had L forearm graft used 10 months then removed Dec 2012 due to suspected infection.  A right upper arm AVG was placed Dec 2012 but she developed steal symptoms acutely and it was ligated the same day.  Never had an AV fistula due to small veins.  Now has L thigh AVG put in Jan 2013, has not clotted to date.    [name] CAD (coronary artery disease) Jan. 2015    Cath: 20% LAD, 50% D1; s/p LIMA-LAD  . Aortic stenosis   . Tobacco abuse   . S/P aortic valve replacement 03/15/13    Mechanical     Past Surgical History  Procedure Laterality Date  . Liver transplant  10/25/2009    sees Dr Ferol Luz 1 every 6 months, saw last in Dec 2013. [name] Coord [phone]  . Small intestine surgery  90's  . Thrombectomy    . Arteriovenous graft placement Left 10/03/10     forearm  . Avgg removal  12/23/2010    Procedure: REMOVAL OF ARTERIOVENOUS GORETEX GRAFT ([place]);  Surgeon: Elam Dutch, MD;  Location: [place];  Service: Vascular;  Laterality: Left;  procedure started @1736 -1017  . Insertion of dialysis catheter  12/23/2010    Procedure: INSERTION OF DIALYSIS CATHETER;  Surgeon: Elam Dutch, MD;  Location: [place];  Service: Vascular;  Laterality: Right;  Right Internal Jugular 28cm dialysis catheter insertion procedure time 1701-1720   . Cholecystectomy  1993  . Cystoscopy  1990's  . Spinal growth rods  2010    "put 2 metal rods in my back; they had detetriorated" (08/30/2012)  . Av fistula placement  01/29/2011    Procedure: INSERTION OF ARTERIOVENOUS (AV) GORE-TEX GRAFT ARM;  Surgeon: Elam Dutch, MD;  Location: [place];  Service: Vascular;  Laterality: Right;  . Av fistula placement  03/10/2011    Procedure: INSERTION OF ARTERIOVENOUS (AV) GORE-TEX GRAFT THIGH;  Surgeon: Elam Dutch, MD;  Location: [place];  Service: Vascular;  Laterality: Left;  . Tubal ligation  1990's  . Cardiac catheterization  2014  . Aortic valve replacement N/A 03/15/2013    AVR; Surgeon: Ivin Poot, MD;  Location: [facility] OR; Open Heart Surgery;  61mm[employer] mechanical prosthesis, top hat valve  . Coronary artery bypass graft N/A 03/15/2013    Procedure: CORONARY ARTERY BYPASS GRAFTING (CABG) times one using left internal mammary artery.;  Surgeon: Ivin Poot, MD;  Location: [place];  Service: Open Heart Surgery;  Laterality: N/A;  POSS CABG X 1  . Intraoperative transesophageal echocardiogram N/A 03/15/2013    Procedure: INTRAOPERATIVE TRANSESOPHAGEAL ECHOCARDIOGRAM;  Surgeon: Ivin Poot, MD;  Location: [place];  Service: Open Heart Surgery;  Laterality: N/A;  . Left heart catheterization with coronary/graft angiogram N/A 12/24/2011    Procedure: LEFT HEART CATHETERIZATION WITH Beatrix Fetters;  Surgeon: [name], MD;  Location: [facility] CATH LAB;  Service: Cardiovascular;  Laterality: N/A;  . Left heart catheterization with coronary angiogram N/A 07/29/2012    Procedure: LEFT HEART CATHETERIZATION WITH CORONARY ANGIOGRAM;  Surgeon: Troy Sine, MD;  Location: [facility] CATH LAB;  Service: Cardiovascular;  Laterality: N/A;  . Left heart catheterization with coronary angiogram N/A 03/10/2013    Procedure: LEFT HEART CATHETERIZATION WITH CORONARY ANGIOGRAM;  Surgeon: Troy Sine, MD;  Location: [facility] CATH LAB;  Service: Cardiovascular;  Laterality: N/A;  . Left heart catheterization with coronary/graft angiogram N/A 12/16/2013    Procedure: LEFT HEART CATHETERIZATION WITH Beatrix Fetters;  Surgeon: Troy Sine, MD;  Location: [facility] CATH LAB;  Service: Cardiovascular;  Laterality: N/A;     Hospital Course See H&P, Labs, Consult and Test reports for all details in brief, patient was admitted for **  Principal Problem:   Chest pain Active Problems:   Hypothyroidism   End stage renal disease on  dialysis-TTS   S/P mechanical AVR Feb 2015   S/P CABG x 1, LIMA-LAD 03/2013   DM type 2 (diabetes mellitus, type 2)   S/P liver transplant  [name] is a 59 y.o. female with a Past Medical History of end-stage renal disease on hemodialysis, history of CAD status post CABG, history of aortic stenosis status post mechanical aortic valve placement, history of hep C with liver transplantation on chronic tacrolimus therapy who presents today with complaints of chest pain , she was in her usual state of health, she woke up around 6 AM this morning with left-sided chest pain and palpitations. She describes the chest pain  as pressure-like, lasted approximately 15-20 minutes and was relieved by sublingual nitroglycerin. It was no associated shortness of breath, nausea vomiting. She was subsequently brought to [facility] emergency room, and the hospitalist service here at [facility] was called to transfer the patient because of dialysis needs. Patient denies any headache, fever, nausea, vomiting or diarrhea. Her urologic consults were called, patient had no significant findings on EKG, was symptom free during hospital stay, labs without significant abnormalities, her troponins were stable at 0.04, cardiology were concerned about palpitation, there was no evidence of arrhythmia on monitor, patient walked on the hallway with no palpitation on telemetry, patient will be discharged with no further cardiac workup recommended by cardiology at this point.   . Chest pain:  - EKG nonacute -Troponins are stable at 0.043 - No further episodes of chest pain - No significant events on telemetry - Due to consult appreciated, no further workup is indicated.  . Palpitations:  -Patient also complained of palpitations early this morning, she does have a history of paroxysmal SVT most recently in January 2016. She will be monitored in the telemetry unit and followed clinically.  - No evidence on  telemetry - Patient was walked total of 500 feet without assistance, with no pain or discomfort, heart rate remained normal sinus rhythm on telemetry, without significant arrhythmias  .ESRD on HD TTS: -To resume her hemodialysis as scheduled tomorrow as an outpatient.  . Hypothyroidism:  -Continue with levothyroxine  . History of liver transplant: -patient has a long-standing history of hep C, underwent liver transplant in 2010. Continue with tacrolimus.   . Type 2 diabetes:  Episode of hypoglycemia during hospital stay, patient was encouraged to eat snacks between meals.  . History of aortic valve replacement: - History of aortic valve replacement with mechanical valve, continue Coumadin. INR is 2.2 at day of discharge COPD: - Stable.   Consults  Cardiology  Significant Tests:  See full reports for all details    Dg Chest 2 View  02/19/2014   CLINICAL DATA:  Sharp chest pain with palpitations, headache, and shortness of breath. Currently on warfarin. Smoker. Dialysis patient.  EXAM: CHEST  2 VIEW  COMPARISON:  01/03/2014  FINDINGS: Postoperative changes in the mediastinum, lower lumbar spine, and upper abdomen. Cardiac valve prosthesis. The heart size and mediastinal contours are within normal limits. Both lungs are clear. The visualized skeletal structures are unremarkable. Calcification of the aorta.  IMPRESSION: No active cardiopulmonary disease.   Electronically Signed   By: Lucienne Capers M.D.   On: 02/19/2014 02:58   Dg Chest Portable 1 View  03/07/2014   CLINICAL DATA:  Acute chest pain since yesterday.  EXAM: PORTABLE CHEST - 1 VIEW  COMPARISON:  February 19, 2014.  FINDINGS: Stable cardiomediastinal silhouette. Sternotomy wires are noted. No pneumothorax or significant pleural effusion is noted. No acute pulmonary disease is noted. Bony thorax appears intact.  IMPRESSION: No acute cardiopulmonary abnormality seen.   Electronically Signed   By: Sabino Dick M.D.   On:  03/07/2014 08:15     Today   Subjective:   [name] today has no headache,no chest abdominal pain,no new weakness tingling or numbness, feels much better wants to go home today.   Objective:   Blood pressure 129/38, pulse 65, temperature 97.6 F (36.4 C), temperature source Oral, resp. rate 20, height 5\' 4"  (1.626 m), weight 76.9 kg (169 lb 8.5 oz), SpO2 100 %.  Intake/Output Summary (Last 24 hours) at 03/20/14 1647  Last data filed at 03/20/14 1325  Gross per 24 hour  Intake    240 ml  Output      0 ml  Net    240 ml    Exam Awake Alert, Oriented *3, No new F.N deficits, Normal affect [place].AT,PERRAL Supple Neck,No JVD, No cervical lymphadenopathy appriciated.  Symmetrical Chest wall movement, Good air movement bilaterally, CTAB RRR,No Gallops, No Parasternal Heave, metallic click +ve B.Sounds, Abd Soft, Non tender, No organomegaly appriciated, No rebound -guarding or rigidity. No Cyanosis, Clubbing or edema, No new Rash or bruise  Data Review   Cultures -   CBC w Diff: Lab Results  Component Value Date   WBC 5.0 03/19/2014   HGB 9.6* 03/19/2014   HCT 30.1* 03/19/2014   PLT 72* 03/19/2014   LYMPHOPCT 32 02/19/2014   MONOPCT 12 02/19/2014   EOSPCT 2 02/19/2014   BASOPCT 1 02/19/2014   CMP: Lab Results  Component Value Date   NA 141 03/19/2014   K 4.7 03/19/2014   CL 101 03/19/2014   CO2 31 03/19/2014   BUN 19 03/19/2014   CREATININE 6.15* 03/19/2014   PROT 7.6 03/07/2014   ALBUMIN 2.8* 03/19/2014   BILITOT 0.9 03/07/2014   ALKPHOS 88 03/07/2014   AST 42* 03/07/2014   ALT 35 03/07/2014  .  Micro Results No results found for this or any previous visit (from the past 240 hour(s)).   Discharge Instructions        Discharge Medications     Medication List    STOP taking these medications        azithromycin 500 MG tablet  Commonly known as:  ZITHROMAX      TAKE these medications        albuterol 90 MCG/ACT inhaler  Commonly known as:   PROVENTIL,VENTOLIN  Inhale 1-2 puffs into the lungs every 6 (six) hours as needed for wheezing or shortness of breath.     artificial tears Oint ophthalmic ointment  Place 1 application into the right eye every 4 (four) hours. Apply under right eye lid after administering eye drops     aspirin EC 81 MG tablet  Take 81 mg by mouth daily.     calcium acetate 667 MG capsule  Commonly known as:  PHOSLO  Take 667-2,001 mg by mouth See admin instructions. Take 3 capsules (2001 mg) with meals and 1 capsule (667 mg) with snacks     cinacalcet 30 MG tablet  Commonly known as:  SENSIPAR  Take 30 mg by mouth daily.     dorzolamide-timolol 22.3-6.8 MG/ML ophthalmic solution  Commonly known as:  COSOPT  Place 1 drop into the right eye every 4 (four) hours. Follow with artificial tears ointment under eye lid     gabapentin 300 MG capsule  Commonly known as:  NEURONTIN  Take 300 mg by mouth at bedtime.     insulin aspart 100 UNIT/ML injection  Commonly known as:  novoLOG  Inject 1-3 Units into the skin 3 (three) times daily as needed for high blood sugar (CBG>150). Per sliding scale     levothyroxine 150 MCG tablet  Commonly known as:  SYNTHROID, LEVOTHROID  Take 150 mcg by mouth daily before breakfast.     magnesium oxide 400 MG tablet  Commonly known as:  MAG-OX  Take 400 mg by mouth daily.     metoCLOPramide 10 MG tablet  Commonly known as:  REGLAN  Take 10 mg by mouth 3 (three) times daily before  meals.     metoprolol tartrate 25 MG tablet  Commonly known as:  LOPRESSOR  Take 25 mg by mouth 2 (two) times daily.     nitroGLYCERIN 0.4 MG SL tablet  Commonly known as:  NITROSTAT  Place 1 tablet (0.4 mg total) under the tongue every 5 (five) minutes as needed for chest pain.     ondansetron 4 MG disintegrating tablet  Commonly known as:  ZOFRAN-ODT  Take 4 mg by mouth every 8 (eight) hours as needed for nausea or vomiting.     tacrolimus 1 MG capsule  Commonly known as:   PROGRAF  Take 3 mg by mouth 2 (two) times daily.     traMADol 50 MG tablet  Commonly known as:  ULTRAM  Take 1 tablet (50 mg total) by mouth every 12 (twelve) hours as needed for moderate pain.     warfarin 4 MG tablet  Commonly known as:  COUMADIN  Take 1 tablet (4 mg total) by mouth daily.         Total Time in preparing paper work, data evaluation and todays exam - 35 minutes  Shacora Zynda M.D on 03/20/2014 at 4:47 PM  [place]  [phone]

## 2014-03-20 NOTE — Progress Notes (Signed)
ANTICOAGULATION CONSULT NOTE Pharmacy Consult for Coumadin Indication: mechanical aortic valve  Allergies  Allergen Reactions  . Acetaminophen Other (See Comments)    Liver transplant recipient   . Codeine Itching  . Mirtazapine Other (See Comments)    hallucination  . Morphine Itching    Patient Measurements: Height: 5\' 4"  (162.6 cm) Weight: 169 lb 8.5 oz (76.9 kg) IBW/kg (Calculated) : 54.7 Heparin Dosing Weight: n/a  Vital Signs: Temp: 97.7 F (36.5 C) (02/08 0515) Temp Source: Oral (02/08 0515) BP: 121/34 mmHg (02/08 0515) Pulse Rate: 63 (02/08 0515)  Labs:  Recent Labs  03/19/14 1400 03/19/14 1813 03/20/14 0050  HGB 9.6*  --   --   HCT 30.1*  --   --   PLT 72*  --   --   LABPROT 23.6*  --  24.7*  INR 2.08*  --  2.21*  CREATININE 6.15*  --   --   TROPONINI 0.04* 0.04* 0.04*    Estimated Creatinine Clearance: 9.9 mL/min (by C-G formula based on Cr of 6.15).  Assessment: 59 yo female with hx of ESRD, liver txp, and mechanical aortic valve admitted with chest pain.  Pharmacy asked to continue PTA Coumadin while she is here in hospital.  Per outpatient records, INR goal is 2.5-3.  PTA Coumadin dose was 4 mg daily, but she says they held it during her admission to [facility] last week due to her INR being > 4.  She did resume taking it Friday night (as directed), but thinks she did not take it last night due to feeling poorly.  Also reported some bleeding behind her eye after a fall ([place] admission), but she says they did not hold her Coumadin for this reason.  Today's INR below goal at 2.21  Goal of Therapy:  INR 2.5-3 Monitor platelets by anticoagulation protocol: Yes   Plan:  1. Coumadin 4 mg x 1 tonight. 2. Daily PT/INR.  Thank you. Anette Guarneri, PharmD [phone]   03/20/2014 10:00 AM

## 2014-03-20 NOTE — Progress Notes (Signed)
Pt ambulated in hallways walked without assistance total of 500 ft with no pain or discomfort. Heart rate: 60, NSR Chancy Milroy

## 2014-03-21 DIAGNOSIS — E118 Type 2 diabetes mellitus with unspecified complications: Secondary | ICD-10-CM | POA: Diagnosis not present

## 2014-03-21 DIAGNOSIS — I359 Nonrheumatic aortic valve disorder, unspecified: Secondary | ICD-10-CM | POA: Diagnosis not present

## 2014-03-21 DIAGNOSIS — N2581 Secondary hyperparathyroidism of renal origin: Secondary | ICD-10-CM | POA: Diagnosis not present

## 2014-03-21 DIAGNOSIS — D631 Anemia in chronic kidney disease: Secondary | ICD-10-CM | POA: Diagnosis not present

## 2014-03-21 DIAGNOSIS — N186 End stage renal disease: Secondary | ICD-10-CM | POA: Diagnosis not present

## 2014-03-21 LAB — GLUCOSE, CAPILLARY
GLUCOSE-CAPILLARY: 230 mg/dL — AB (ref 70–99)
Glucose-Capillary: 67 mg/dL — ABNORMAL LOW (ref 70–99)
Glucose-Capillary: 82 mg/dL (ref 70–99)

## 2014-03-21 LAB — PROTIME-INR: INR: 2.8 — AB (ref <=1.1)

## 2014-03-22 ENCOUNTER — Ambulatory Visit (INDEPENDENT_AMBULATORY_CARE_PROVIDER_SITE_OTHER): Payer: Medicare Other | Admitting: Pharmacist Clinician (PhC)/ Clinical Pharmacy Specialist

## 2014-03-22 DIAGNOSIS — Z954 Presence of other heart-valve replacement: Secondary | ICD-10-CM

## 2014-03-22 DIAGNOSIS — Z952 Presence of prosthetic heart valve: Secondary | ICD-10-CM

## 2014-03-23 DIAGNOSIS — D631 Anemia in chronic kidney disease: Secondary | ICD-10-CM | POA: Diagnosis not present

## 2014-03-23 DIAGNOSIS — E118 Type 2 diabetes mellitus with unspecified complications: Secondary | ICD-10-CM | POA: Diagnosis not present

## 2014-03-23 DIAGNOSIS — N2581 Secondary hyperparathyroidism of renal origin: Secondary | ICD-10-CM | POA: Diagnosis not present

## 2014-03-23 DIAGNOSIS — N186 End stage renal disease: Secondary | ICD-10-CM | POA: Diagnosis not present

## 2014-03-24 ENCOUNTER — Telehealth: Payer: Self-pay | Admitting: *Deleted

## 2014-03-24 DIAGNOSIS — R002 Palpitations: Secondary | ICD-10-CM

## 2014-03-24 NOTE — Telephone Encounter (Signed)
Dr. Harrington Challenger ordering event monitor for palpitations. Appt for [name] Downs, MD, scheduled in mid-March.

## 2014-03-25 DIAGNOSIS — D631 Anemia in chronic kidney disease: Secondary | ICD-10-CM | POA: Diagnosis not present

## 2014-03-25 DIAGNOSIS — N2581 Secondary hyperparathyroidism of renal origin: Secondary | ICD-10-CM | POA: Diagnosis not present

## 2014-03-25 DIAGNOSIS — E118 Type 2 diabetes mellitus with unspecified complications: Secondary | ICD-10-CM | POA: Diagnosis not present

## 2014-03-25 DIAGNOSIS — N186 End stage renal disease: Secondary | ICD-10-CM | POA: Diagnosis not present

## 2014-03-28 ENCOUNTER — Encounter (INDEPENDENT_AMBULATORY_CARE_PROVIDER_SITE_OTHER): Payer: Medicare Other

## 2014-03-28 ENCOUNTER — Encounter: Payer: Self-pay | Admitting: *Deleted

## 2014-03-28 DIAGNOSIS — D631 Anemia in chronic kidney disease: Secondary | ICD-10-CM | POA: Diagnosis not present

## 2014-03-28 DIAGNOSIS — R002 Palpitations: Secondary | ICD-10-CM

## 2014-03-28 DIAGNOSIS — E118 Type 2 diabetes mellitus with unspecified complications: Secondary | ICD-10-CM | POA: Diagnosis not present

## 2014-03-28 DIAGNOSIS — N186 End stage renal disease: Secondary | ICD-10-CM | POA: Diagnosis not present

## 2014-03-28 DIAGNOSIS — N2581 Secondary hyperparathyroidism of renal origin: Secondary | ICD-10-CM | POA: Diagnosis not present

## 2014-03-28 NOTE — Progress Notes (Signed)
Patient ID: [name], female   DOB: 17-Apr-1955, 59 y.o.   MRN: [id number] [employer] verite 30 day cardiac event monitor applied to patient.

## 2014-03-30 ENCOUNTER — Telehealth: Payer: Self-pay | Admitting: Cardiovascular Disease

## 2014-03-30 ENCOUNTER — Encounter (HOSPITAL_COMMUNITY)
Admission: EM | Disposition: A | Discharge status: Home / Self Care | Payer: Self-pay | Source: Home / Self Care | Attending: Internal Medicine

## 2014-03-30 ENCOUNTER — Ambulatory Visit (HOSPITAL_BASED_OUTPATIENT_CLINIC_OR_DEPARTMENT_OTHER): Payer: Medicare Other

## 2014-03-30 ENCOUNTER — Inpatient Hospital Stay (HOSPITAL_COMMUNITY)
Admission: EM | Admit: 2014-03-30 | Discharge: 2014-04-04 | DRG: 252 | Disposition: A | Discharge status: Home / Self Care | Payer: Medicare Other | Attending: Internal Medicine | Admitting: Internal Medicine

## 2014-03-30 ENCOUNTER — Encounter (HOSPITAL_COMMUNITY): Payer: Self-pay | Admitting: Emergency Medicine

## 2014-03-30 ENCOUNTER — Emergency Department (HOSPITAL_COMMUNITY): Payer: Medicare Other | Admitting: Anesthesiology

## 2014-03-30 DIAGNOSIS — Z7901 Long term (current) use of anticoagulants: Secondary | ICD-10-CM | POA: Diagnosis not present

## 2014-03-30 DIAGNOSIS — Z79899 Other long term (current) drug therapy: Secondary | ICD-10-CM

## 2014-03-30 DIAGNOSIS — I350 Nonrheumatic aortic (valve) stenosis: Secondary | ICD-10-CM | POA: Diagnosis present

## 2014-03-30 DIAGNOSIS — I120 Hypertensive chronic kidney disease with stage 5 chronic kidney disease or end stage renal disease: Secondary | ICD-10-CM | POA: Diagnosis present

## 2014-03-30 DIAGNOSIS — M1990 Unspecified osteoarthritis, unspecified site: Secondary | ICD-10-CM | POA: Diagnosis present

## 2014-03-30 DIAGNOSIS — Z720 Tobacco use: Secondary | ICD-10-CM | POA: Diagnosis present

## 2014-03-30 DIAGNOSIS — T82838A Hemorrhage of vascular prosthetic devices, implants and grafts, initial encounter: Secondary | ICD-10-CM | POA: Diagnosis not present

## 2014-03-30 DIAGNOSIS — K219 Gastro-esophageal reflux disease without esophagitis: Secondary | ICD-10-CM | POA: Diagnosis present

## 2014-03-30 DIAGNOSIS — I2510 Atherosclerotic heart disease of native coronary artery without angina pectoris: Secondary | ICD-10-CM | POA: Diagnosis present

## 2014-03-30 DIAGNOSIS — Z794 Long term (current) use of insulin: Secondary | ICD-10-CM

## 2014-03-30 DIAGNOSIS — N186 End stage renal disease: Secondary | ICD-10-CM | POA: Diagnosis not present

## 2014-03-30 DIAGNOSIS — M549 Dorsalgia, unspecified: Secondary | ICD-10-CM | POA: Diagnosis not present

## 2014-03-30 DIAGNOSIS — Z833 Family history of diabetes mellitus: Secondary | ICD-10-CM

## 2014-03-30 DIAGNOSIS — Z952 Presence of prosthetic heart valve: Secondary | ICD-10-CM

## 2014-03-30 DIAGNOSIS — J449 Chronic obstructive pulmonary disease, unspecified: Secondary | ICD-10-CM | POA: Diagnosis present

## 2014-03-30 DIAGNOSIS — E119 Type 2 diabetes mellitus without complications: Secondary | ICD-10-CM | POA: Diagnosis present

## 2014-03-30 DIAGNOSIS — D649 Anemia, unspecified: Secondary | ICD-10-CM | POA: Diagnosis present

## 2014-03-30 DIAGNOSIS — I5042 Chronic combined systolic (congestive) and diastolic (congestive) heart failure: Secondary | ICD-10-CM | POA: Diagnosis present

## 2014-03-30 DIAGNOSIS — B182 Chronic viral hepatitis C: Secondary | ICD-10-CM | POA: Diagnosis present

## 2014-03-30 DIAGNOSIS — G4730 Sleep apnea, unspecified: Secondary | ICD-10-CM | POA: Diagnosis present

## 2014-03-30 DIAGNOSIS — Z951 Presence of aortocoronary bypass graft: Secondary | ICD-10-CM | POA: Diagnosis not present

## 2014-03-30 DIAGNOSIS — I739 Peripheral vascular disease, unspecified: Secondary | ICD-10-CM | POA: Diagnosis present

## 2014-03-30 DIAGNOSIS — E118 Type 2 diabetes mellitus with unspecified complications: Secondary | ICD-10-CM | POA: Diagnosis not present

## 2014-03-30 DIAGNOSIS — Z992 Dependence on renal dialysis: Secondary | ICD-10-CM | POA: Diagnosis not present

## 2014-03-30 DIAGNOSIS — T82898A Other specified complication of vascular prosthetic devices, implants and grafts, initial encounter: Secondary | ICD-10-CM | POA: Diagnosis not present

## 2014-03-30 DIAGNOSIS — D62 Acute posthemorrhagic anemia: Secondary | ICD-10-CM | POA: Diagnosis present

## 2014-03-30 DIAGNOSIS — K7460 Unspecified cirrhosis of liver: Secondary | ICD-10-CM | POA: Diagnosis present

## 2014-03-30 DIAGNOSIS — T829XXA Unspecified complication of cardiac and vascular prosthetic device, implant and graft, initial encounter: Secondary | ICD-10-CM | POA: Diagnosis present

## 2014-03-30 DIAGNOSIS — I10 Essential (primary) hypertension: Secondary | ICD-10-CM | POA: Diagnosis not present

## 2014-03-30 DIAGNOSIS — Z944 Liver transplant status: Secondary | ICD-10-CM | POA: Diagnosis not present

## 2014-03-30 DIAGNOSIS — R58 Hemorrhage, not elsewhere classified: Secondary | ICD-10-CM | POA: Diagnosis not present

## 2014-03-30 DIAGNOSIS — T8189XA Other complications of procedures, not elsewhere classified, initial encounter: Secondary | ICD-10-CM | POA: Diagnosis not present

## 2014-03-30 DIAGNOSIS — E039 Hypothyroidism, unspecified: Secondary | ICD-10-CM | POA: Diagnosis present

## 2014-03-30 DIAGNOSIS — Y841 Kidney dialysis as the cause of abnormal reaction of the patient, or of later complication, without mention of misadventure at the time of the procedure: Secondary | ICD-10-CM | POA: Diagnosis present

## 2014-03-30 DIAGNOSIS — J45909 Unspecified asthma, uncomplicated: Secondary | ICD-10-CM | POA: Diagnosis present

## 2014-03-30 DIAGNOSIS — Z7982 Long term (current) use of aspirin: Secondary | ICD-10-CM

## 2014-03-30 DIAGNOSIS — F17210 Nicotine dependence, cigarettes, uncomplicated: Secondary | ICD-10-CM | POA: Diagnosis present

## 2014-03-30 DIAGNOSIS — F418 Other specified anxiety disorders: Secondary | ICD-10-CM | POA: Diagnosis present

## 2014-03-30 DIAGNOSIS — D631 Anemia in chronic kidney disease: Secondary | ICD-10-CM | POA: Diagnosis not present

## 2014-03-30 DIAGNOSIS — N2581 Secondary hyperparathyroidism of renal origin: Secondary | ICD-10-CM | POA: Diagnosis present

## 2014-03-30 DIAGNOSIS — R791 Abnormal coagulation profile: Secondary | ICD-10-CM | POA: Diagnosis present

## 2014-03-30 DIAGNOSIS — T8131XA Disruption of external operation (surgical) wound, not elsewhere classified, initial encounter: Secondary | ICD-10-CM | POA: Diagnosis not present

## 2014-03-30 HISTORY — PX: THROMBECTOMY AND REVISION OF ARTERIOVENTOUS (AV) GORETEX  GRAFT: SHX6120

## 2014-03-30 LAB — I-STAT CHEM 8, ED
BUN: 11 mg/dL (ref 6–23)
Calcium, Ion: 0.86 mmol/L — ABNORMAL LOW (ref 1.12–1.23)
Chloride: 96 mmol/L (ref 96–112)
Creatinine, Ser: 3.9 mg/dL — ABNORMAL HIGH (ref 0.50–1.10)
Glucose, Bld: 146 mg/dL — ABNORMAL HIGH (ref 70–99)
HEMATOCRIT: 30 % — AB (ref 36.0–46.0)
Hemoglobin: 10.2 g/dL — ABNORMAL LOW (ref 12.0–15.0)
Potassium: 4.3 mmol/L (ref 3.5–5.1)
SODIUM: 137 mmol/L (ref 135–145)
TCO2: 30 mmol/L (ref 0–100)

## 2014-03-30 LAB — CBC WITH DIFFERENTIAL/PLATELET
BASOS ABS: 0 10*3/uL (ref 0.0–0.1)
Basophils Relative: 1 % (ref 0–1)
EOS ABS: 0.2 10*3/uL (ref 0.0–0.7)
Eosinophils Relative: 5 % (ref 0–5)
HCT: 28.1 % — ABNORMAL LOW (ref 36.0–46.0)
Hemoglobin: 9 g/dL — ABNORMAL LOW (ref 12.0–15.0)
Lymphocytes Relative: 26 % (ref 12–46)
Lymphs Abs: 1.4 10*3/uL (ref 0.7–4.0)
MCH: 31.8 pg (ref 26.0–34.0)
MCHC: 32 g/dL (ref 30.0–36.0)
MCV: 99.3 fL (ref 78.0–100.0)
Monocytes Absolute: 0.4 10*3/uL (ref 0.1–1.0)
Monocytes Relative: 8 % (ref 3–12)
NEUTROS ABS: 3.1 10*3/uL (ref 1.7–7.7)
Neutrophils Relative %: 61 % (ref 43–77)
Platelets: 91 10*3/uL — ABNORMAL LOW (ref 150–400)
RBC: 2.83 MIL/uL — ABNORMAL LOW (ref 3.87–5.11)
RDW: 15.4 % (ref 11.5–15.5)
WBC: 5.1 10*3/uL (ref 4.0–10.5)

## 2014-03-30 LAB — GLUCOSE, CAPILLARY
GLUCOSE-CAPILLARY: 127 mg/dL — AB (ref 70–99)
GLUCOSE-CAPILLARY: 111 mg/dL — AB (ref 70–99)

## 2014-03-30 LAB — PROTIME-INR
INR: 2.77 — ABNORMAL HIGH (ref 0.00–1.49)
Prothrombin Time: 29.5 seconds — ABNORMAL HIGH (ref 11.6–15.2)

## 2014-03-30 SURGERY — THROMBECTOMY AND REVISION OF ARTERIOVENTOUS (AV) GORETEX  GRAFT
Anesthesia: General | Site: Thigh | Laterality: Left

## 2014-03-30 MED ORDER — SODIUM CHLORIDE 0.9 % IV SOLN
10.0000 mL/h | Freq: Once | INTRAVENOUS | Status: AC
  Administered 2014-03-30 (×2): via INTRAVENOUS

## 2014-03-30 MED ORDER — ALBUTEROL SULFATE (2.5 MG/3ML) 0.083% IN NEBU: 3.0000 mL | INHALATION_SOLUTION | Freq: Four times a day (QID) | RESPIRATORY_TRACT | Status: DC | PRN

## 2014-03-30 MED ORDER — ALUM & MAG HYDROXIDE-SIMETH 200-200-20 MG/5ML PO SUSP: 15.0000 mL | ORAL | Status: DC | PRN

## 2014-03-30 MED ORDER — MORPHINE SULFATE 2 MG/ML IJ SOLN: 2.0000 mg | INTRAMUSCULAR | Status: DC | PRN

## 2014-03-30 MED ORDER — METOCLOPRAMIDE HCL 10 MG PO TABS
10.0000 mg | ORAL_TABLET | Freq: Three times a day (TID) | ORAL | Status: DC
  Administered 2014-03-31 – 2014-04-04 (×12): 10 mg via ORAL
  Filled 2014-03-30 (×17): qty 1

## 2014-03-30 MED ORDER — NEOSTIGMINE METHYLSULFATE 10 MG/10ML IV SOLN
INTRAVENOUS | Status: AC
Start: 2014-03-30 — End: 2014-03-30
  Filled 2014-03-30: qty 1

## 2014-03-30 MED ORDER — MIDAZOLAM HCL 5 MG/5ML IJ SOLN
INTRAMUSCULAR | Status: DC | PRN
  Administered 2014-03-30: 2 mg via INTRAVENOUS

## 2014-03-30 MED ORDER — THROMBIN 20000 UNITS EX SOLR
CUTANEOUS | Status: AC
  Filled 2014-03-30: qty 20000

## 2014-03-30 MED ORDER — GABAPENTIN 300 MG PO CAPS
300.0000 mg | ORAL_CAPSULE | Freq: Every day | ORAL | Status: DC
  Administered 2014-03-30 – 2014-04-03 (×5): 300 mg via ORAL
  Filled 2014-03-30 (×6): qty 1

## 2014-03-30 MED ORDER — ALBUMIN HUMAN 5 % IV SOLN
INTRAVENOUS | Status: DC | PRN
  Administered 2014-03-30: 20:00:00 via INTRAVENOUS

## 2014-03-30 MED ORDER — PANTOPRAZOLE SODIUM 40 MG PO TBEC
40.0000 mg | DELAYED_RELEASE_TABLET | Freq: Every day | ORAL | Status: DC
  Administered 2014-03-31 – 2014-04-03 (×3): 40 mg via ORAL
  Filled 2014-03-30 (×3): qty 1

## 2014-03-30 MED ORDER — BISACODYL 10 MG RE SUPP: 10.0000 mg | Freq: Every day | RECTAL | Status: DC | PRN

## 2014-03-30 MED ORDER — CEFAZOLIN SODIUM-DEXTROSE 2-3 GM-% IV SOLR
INTRAVENOUS | Status: DC | PRN
  Administered 2014-03-30: 2 g via INTRAVENOUS

## 2014-03-30 MED ORDER — PHENYLEPHRINE HCL 10 MG/ML IJ SOLN
10.0000 mg | INTRAMUSCULAR | Status: DC | PRN
  Administered 2014-03-30: 20 ug/min via INTRAVENOUS

## 2014-03-30 MED ORDER — ARTIFICIAL TEARS OP OINT
1.0000 "application " | TOPICAL_OINTMENT | OPHTHALMIC | Status: DC
  Administered 2014-03-30 – 2014-04-04 (×22): 1 via OPHTHALMIC
  Filled 2014-03-30: qty 3.5

## 2014-03-30 MED ORDER — CALCIUM ACETATE 667 MG PO CAPS: 667.0000 mg | ORAL_CAPSULE | ORAL | Status: DC

## 2014-03-30 MED ORDER — FENTANYL CITRATE 0.05 MG/ML IJ SOLN: 25.0000 ug | INTRAMUSCULAR | Status: DC | PRN

## 2014-03-30 MED ORDER — DORZOLAMIDE HCL-TIMOLOL MAL 2-0.5 % OP SOLN
1.0000 [drp] | OPHTHALMIC | Status: DC
  Administered 2014-03-30 – 2014-04-04 (×24): 1 [drp] via OPHTHALMIC
  Filled 2014-03-30: qty 10

## 2014-03-30 MED ORDER — DOCUSATE SODIUM 100 MG PO CAPS
100.0000 mg | ORAL_CAPSULE | Freq: Every day | ORAL | Status: DC
  Administered 2014-03-31 – 2014-04-04 (×4): 100 mg via ORAL
  Filled 2014-03-30 (×5): qty 1

## 2014-03-30 MED ORDER — NITROGLYCERIN 0.4 MG SL SUBL: 0.4000 mg | SUBLINGUAL_TABLET | SUBLINGUAL | Status: DC | PRN

## 2014-03-30 MED ORDER — FENTANYL CITRATE 0.05 MG/ML IJ SOLN
INTRAMUSCULAR | Status: AC
  Filled 2014-03-30: qty 5

## 2014-03-30 MED ORDER — INSULIN ASPART 100 UNIT/ML ~~LOC~~ SOLN: 1.0000 [IU] | Freq: Three times a day (TID) | SUBCUTANEOUS | Status: DC | PRN

## 2014-03-30 MED ORDER — FENTANYL CITRATE 0.05 MG/ML IJ SOLN
INTRAMUSCULAR | Status: DC | PRN
  Administered 2014-03-30: 50 ug via INTRAVENOUS

## 2014-03-30 MED ORDER — SODIUM CHLORIDE 0.9 % IR SOLN
Status: DC | PRN
  Administered 2014-03-30: 18:00:00

## 2014-03-30 MED ORDER — PROMETHAZINE HCL 25 MG/ML IJ SOLN: 6.2500 mg | INTRAMUSCULAR | Status: DC | PRN

## 2014-03-30 MED ORDER — CINACALCET HCL 30 MG PO TABS
30.0000 mg | ORAL_TABLET | Freq: Every day | ORAL | Status: DC
  Administered 2014-03-31 – 2014-04-03 (×4): 30 mg via ORAL
  Filled 2014-03-30 (×5): qty 1

## 2014-03-30 MED ORDER — HYDRALAZINE HCL 20 MG/ML IJ SOLN: 5.0000 mg | INTRAMUSCULAR | Status: DC | PRN

## 2014-03-30 MED ORDER — PHENYLEPHRINE HCL 10 MG/ML IJ SOLN
INTRAMUSCULAR | Status: DC | PRN
  Administered 2014-03-30 (×5): 80 ug via INTRAVENOUS

## 2014-03-30 MED ORDER — SODIUM CHLORIDE 0.9 % IV SOLN: Freq: Once | INTRAVENOUS | Status: DC

## 2014-03-30 MED ORDER — SODIUM CHLORIDE 0.9 % IJ SOLN
3.0000 mL | Freq: Two times a day (BID) | INTRAMUSCULAR | Status: DC
  Administered 2014-03-30 – 2014-04-03 (×5): 3 mL via INTRAVENOUS

## 2014-03-30 MED ORDER — PROTAMINE SULFATE 10 MG/ML IV SOLN
10.0000 mg | INTRAVENOUS | Status: DC
  Filled 2014-03-30: qty 1

## 2014-03-30 MED ORDER — ONDANSETRON 4 MG PO TBDP
4.0000 mg | ORAL_TABLET | Freq: Three times a day (TID) | ORAL | Status: DC | PRN
  Administered 2014-04-03: 4 mg via ORAL
  Filled 2014-03-30 (×2): qty 1

## 2014-03-30 MED ORDER — LEVOTHYROXINE SODIUM 150 MCG PO TABS
150.0000 ug | ORAL_TABLET | Freq: Every day | ORAL | Status: DC
  Administered 2014-03-31 – 2014-04-03 (×4): 150 ug via ORAL
  Filled 2014-03-30 (×6): qty 1

## 2014-03-30 MED ORDER — PROPOFOL 10 MG/ML IV BOLUS
INTRAVENOUS | Status: DC | PRN
  Administered 2014-03-30: 80 mg via INTRAVENOUS

## 2014-03-30 MED ORDER — HEPARIN (PORCINE) IN NACL 100-0.45 UNIT/ML-% IJ SOLN
900.0000 [IU]/h | INTRAMUSCULAR | Status: DC
  Filled 2014-03-30: qty 250

## 2014-03-30 MED ORDER — VITAMIN K1 10 MG/ML IJ SOLN
10.0000 mg | Freq: Once | INTRAVENOUS | Status: AC
  Administered 2014-03-30: 10 mg via INTRAVENOUS
  Filled 2014-03-30: qty 1

## 2014-03-30 MED ORDER — ROCURONIUM BROMIDE 100 MG/10ML IV SOLN
INTRAVENOUS | Status: DC | PRN
  Administered 2014-03-30: 20 mg via INTRAVENOUS

## 2014-03-30 MED ORDER — DEXTROSE 5 % IV SOLN
1.5000 g | Freq: Once | INTRAVENOUS | Status: AC
  Administered 2014-03-30: 1.5 g via INTRAVENOUS
  Filled 2014-03-30: qty 1.5

## 2014-03-30 MED ORDER — METOPROLOL TARTRATE 25 MG PO TABS
25.0000 mg | ORAL_TABLET | Freq: Two times a day (BID) | ORAL | Status: DC
  Administered 2014-03-30 – 2014-04-04 (×9): 25 mg via ORAL
  Filled 2014-03-30 (×11): qty 1

## 2014-03-30 MED ORDER — TRAMADOL HCL 50 MG PO TABS
50.0000 mg | ORAL_TABLET | Freq: Two times a day (BID) | ORAL | Status: DC | PRN
Start: 2014-03-30 — End: 2014-04-04

## 2014-03-30 MED ORDER — MIDAZOLAM HCL 2 MG/2ML IJ SOLN
INTRAMUSCULAR | Status: AC
  Filled 2014-03-30: qty 2

## 2014-03-30 MED ORDER — INSULIN ASPART 100 UNIT/ML ~~LOC~~ SOLN: 0.0000 [IU] | SUBCUTANEOUS | Status: DC

## 2014-03-30 MED ORDER — ARTIFICIAL TEARS OP OINT
1.0000 "application " | TOPICAL_OINTMENT | OPHTHALMIC | Status: DC
  Filled 2014-03-30: qty 3.5

## 2014-03-30 MED ORDER — TACROLIMUS 1 MG PO CAPS
3.0000 mg | ORAL_CAPSULE | Freq: Two times a day (BID) | ORAL | Status: DC
  Administered 2014-03-30 – 2014-04-04 (×9): 3 mg via ORAL
  Filled 2014-03-30 (×11): qty 3

## 2014-03-30 MED ORDER — ASPIRIN EC 81 MG PO TBEC
81.0000 mg | DELAYED_RELEASE_TABLET | Freq: Every day | ORAL | Status: DC
  Administered 2014-03-30 – 2014-04-04 (×5): 81 mg via ORAL
  Filled 2014-03-30 (×6): qty 1

## 2014-03-30 MED ORDER — ROCURONIUM BROMIDE 50 MG/5ML IV SOLN
INTRAVENOUS | Status: AC
  Filled 2014-03-30: qty 1

## 2014-03-30 MED ORDER — GLYCOPYRROLATE 0.2 MG/ML IJ SOLN
INTRAMUSCULAR | Status: DC | PRN
  Administered 2014-03-30: 0.4 mg via INTRAVENOUS

## 2014-03-30 MED ORDER — LIDOCAINE-EPINEPHRINE 1 %-1:100000 IJ SOLN
20.0000 mL | Freq: Once | INTRAMUSCULAR | Status: DC
  Filled 2014-03-30: qty 1

## 2014-03-30 MED ORDER — INSULIN ASPART 100 UNIT/ML ~~LOC~~ SOLN
0.0000 [IU] | SUBCUTANEOUS | Status: DC
  Administered 2014-03-31: 2 [IU] via SUBCUTANEOUS
  Administered 2014-03-31: 1 [IU] via SUBCUTANEOUS
  Administered 2014-03-31: 3 [IU] via SUBCUTANEOUS
  Administered 2014-04-01: 2 [IU] via SUBCUTANEOUS
  Administered 2014-04-01: 1 [IU] via SUBCUTANEOUS
  Administered 2014-04-01 – 2014-04-02 (×3): 2 [IU] via SUBCUTANEOUS
  Administered 2014-04-02: 1 [IU] via SUBCUTANEOUS

## 2014-03-30 MED ORDER — LABETALOL HCL 5 MG/ML IV SOLN
10.0000 mg | INTRAVENOUS | Status: DC | PRN
  Filled 2014-03-30: qty 4

## 2014-03-30 MED ORDER — MORPHINE SULFATE 2 MG/ML IJ SOLN
2.0000 mg | INTRAMUSCULAR | Status: DC | PRN
  Administered 2014-03-30: 2 mg via INTRAVENOUS
  Administered 2014-03-30: 3 mg via INTRAVENOUS
  Filled 2014-03-30: qty 1
  Filled 2014-03-30: qty 2

## 2014-03-30 MED ORDER — PROPOFOL 10 MG/ML IV BOLUS
INTRAVENOUS | Status: AC
  Filled 2014-03-30: qty 20

## 2014-03-30 MED ORDER — HEPARIN (PORCINE) IN NACL 100-0.45 UNIT/ML-% IJ SOLN
900.0000 [IU]/h | INTRAMUSCULAR | Status: DC
  Administered 2014-03-31: 900 [IU]/h via INTRAVENOUS
  Filled 2014-03-30: qty 250

## 2014-03-30 MED ORDER — GUAIFENESIN-DM 100-10 MG/5ML PO SYRP: 15.0000 mL | ORAL_SOLUTION | ORAL | Status: DC | PRN

## 2014-03-30 MED ORDER — GLYCOPYRROLATE 0.2 MG/ML IJ SOLN
INTRAMUSCULAR | Status: AC
  Filled 2014-03-30: qty 2

## 2014-03-30 MED ORDER — THROMBIN 20000 UNITS EX SOLR
CUTANEOUS | Status: DC | PRN
  Administered 2014-03-30: 20 mL via TOPICAL

## 2014-03-30 MED ORDER — CALCIUM ACETATE (PHOS BINDER) 667 MG PO CAPS
667.0000 mg | ORAL_CAPSULE | Freq: Two times a day (BID) | ORAL | Status: DC | PRN
  Filled 2014-03-30: qty 1

## 2014-03-30 MED ORDER — ONDANSETRON HCL 4 MG/2ML IJ SOLN: 4.0000 mg | Freq: Four times a day (QID) | INTRAMUSCULAR | Status: DC | PRN

## 2014-03-30 MED ORDER — PHENOL 1.4 % MT LIQD
1.0000 | OROMUCOSAL | Status: DC | PRN
  Filled 2014-03-30: qty 177

## 2014-03-30 MED ORDER — OXYCODONE-ACETAMINOPHEN 5-325 MG PO TABS
1.0000 | ORAL_TABLET | Freq: Four times a day (QID) | ORAL | Status: DC | PRN
  Administered 2014-03-31 – 2014-04-02 (×2): 1 via ORAL
  Filled 2014-03-30 (×2): qty 1

## 2014-03-30 MED ORDER — NEOSTIGMINE METHYLSULFATE 10 MG/10ML IV SOLN
INTRAVENOUS | Status: DC | PRN
  Administered 2014-03-30: 3 mg via INTRAVENOUS

## 2014-03-30 MED ORDER — 0.9 % SODIUM CHLORIDE (POUR BTL) OPTIME
TOPICAL | Status: DC | PRN
  Administered 2014-03-30: 1000 mL

## 2014-03-30 MED ORDER — CALCIUM ACETATE 667 MG PO CAPS
2001.0000 mg | ORAL_CAPSULE | Freq: Three times a day (TID) | ORAL | Status: DC
  Administered 2014-03-31 – 2014-04-04 (×11): 2001 mg via ORAL
  Filled 2014-03-30 (×16): qty 3

## 2014-03-30 MED ORDER — PHENYLEPHRINE 40 MCG/ML (10ML) SYRINGE FOR IV PUSH (FOR BLOOD PRESSURE SUPPORT)
PREFILLED_SYRINGE | INTRAVENOUS | Status: AC
  Filled 2014-03-30: qty 10

## 2014-03-30 MED ORDER — METOPROLOL TARTRATE 1 MG/ML IV SOLN: 2.0000 mg | INTRAVENOUS | Status: DC | PRN

## 2014-03-30 MED ORDER — ONDANSETRON HCL 4 MG/2ML IJ SOLN
INTRAMUSCULAR | Status: DC | PRN
  Administered 2014-03-30: 4 mg via INTRAVENOUS

## 2014-03-30 MED ORDER — LIDOCAINE HCL (CARDIAC) 20 MG/ML IV SOLN
INTRAVENOUS | Status: DC | PRN
  Administered 2014-03-30: 50 mg via INTRAVENOUS

## 2014-03-30 MED ORDER — SODIUM CHLORIDE 0.9 % IV SOLN: 500.0000 mL | Freq: Once | INTRAVENOUS | Status: AC | PRN

## 2014-03-30 MED ORDER — ONDANSETRON HCL 4 MG/2ML IJ SOLN
INTRAMUSCULAR | Status: AC
  Filled 2014-03-30: qty 2

## 2014-03-30 SURGICAL SUPPLY — 33 items
ARMBAND PINK RESTRICT EXTREMIT (MISCELLANEOUS) IMPLANT
BLADE SURG 10 STRL SS (BLADE) IMPLANT
CANISTER SUCTION 2500CC (MISCELLANEOUS) ×3 IMPLANT
CATH EMB 4FR 80CM (CATHETERS) ×3 IMPLANT
CLIP TI MEDIUM 6 (CLIP) ×3 IMPLANT
CLIP TI WIDE RED SMALL 6 (CLIP) ×3 IMPLANT
COVER SURGICAL LIGHT HANDLE (MISCELLANEOUS) IMPLANT
ELECT REM PT RETURN 9FT ADLT (ELECTROSURGICAL) ×3
ELECTRODE REM PT RTRN 9FT ADLT (ELECTROSURGICAL) ×1 IMPLANT
GEL ULTRASOUND 20GR AQUASONIC (MISCELLANEOUS) IMPLANT
GLOVE BIO SURGEON STRL SZ7 (GLOVE) ×3 IMPLANT
GLOVE BIOGEL PI IND STRL 7.5 (GLOVE) ×1 IMPLANT
GLOVE BIOGEL PI INDICATOR 7.5 (GLOVE) ×2
GOWN STRL REUS W/ TWL LRG LVL3 (GOWN DISPOSABLE) ×3 IMPLANT
GOWN STRL REUS W/TWL LRG LVL3 (GOWN DISPOSABLE) ×6
GRAFT GORETEX STND 6X20 (Vascular Products) ×3 IMPLANT
GRAFT GORETEXSTD 6X20 (Vascular Products) ×1 IMPLANT
KIT BASIN OR (CUSTOM PROCEDURE TRAY) ×3 IMPLANT
KIT ROOM TURNOVER OR (KITS) ×3 IMPLANT
LIQUID BAND (GAUZE/BANDAGES/DRESSINGS) ×3 IMPLANT
NS IRRIG 1000ML POUR BTL (IV SOLUTION) ×3 IMPLANT
PACK CV ACCESS (CUSTOM PROCEDURE TRAY) ×3 IMPLANT
PAD ARMBOARD 7.5X6 YLW CONV (MISCELLANEOUS) ×6 IMPLANT
PROBE PENCIL 8 MHZ STRL DISP (MISCELLANEOUS) IMPLANT
SPONGE SURGIFOAM ABS GEL 100 (HEMOSTASIS) IMPLANT
SUT MNCRL AB 4-0 PS2 18 (SUTURE) ×6 IMPLANT
SUT PROLENE 3 0 SH DA (SUTURE) ×3 IMPLANT
SUT PROLENE 5 0 C 1 24 (SUTURE) ×3 IMPLANT
SUT PROLENE 6 0 BV (SUTURE) ×15 IMPLANT
SUT VIC AB 3-0 SH 27 (SUTURE) ×4
SUT VIC AB 3-0 SH 27X BRD (SUTURE) ×2 IMPLANT
UNDERPAD 30X30 INCONTINENT (UNDERPADS AND DIAPERS) ×3 IMPLANT
WATER STERILE IRR 1000ML POUR (IV SOLUTION) ×3 IMPLANT

## 2014-03-30 NOTE — Progress Notes (Addendum)
ANTICOAGULATION CONSULT NOTE - Initial Consult  Pharmacy Consult for Heparin and Warfarin and antibiotics Indication: h/o mechanical AVR with warfarin reversed and surgical prophylaxis  Allergies  Allergen Reactions  . Acetaminophen Other (See Comments)    Liver transplant recipient   . Codeine Itching  . Mirtazapine Other (See Comments)    hallucination  . Morphine Itching    Patient Measurements: Height: 5\' 4"  (162.6 cm) Weight: 165 lb (74.844 kg) IBW/kg (Calculated) : 54.7 Heparin Dosing Weight: 70 kg  Vital Signs: Temp: 97.7 F (36.5 C) (02/18 2005) Temp Source: Oral (02/18 1715) BP: 156/104 mmHg (02/18 2005) Pulse Rate: 82 (02/18 2005)  Labs:  Recent Labs  03/30/14 1636 03/30/14 1649  HGB 9.0* 10.2*  HCT 28.1* 30.0*  PLT 91*  --   LABPROT 29.5*  --   INR 2.77*  --   CREATININE  --  3.90*    Estimated Creatinine Clearance: 15.4 mL/min (by C-G formula based on Cr of 3.9).   Medical History: Past Medical History  Diagnosis Date  . 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 Chronic back pain   . CAD (coronary artery disease)   . Obesity   . Peripheral vascular disease hands and legs  . Anxiety   . Asthma   . GERD (gastroesophageal reflux disease)     takes Omeprazole daily  . Chronic constipation     takes MIralax and Colace daily  . Anemia     takes Folic Acid daily  . Hypothyroidism     takes Synthroid daily  . Depression     takes Cymbalta for "severe" depression  . Neuromuscular disorder     carpal tunnel in right hand  . Hypertension     takes Metoprolol and Lisinopril daily, sees Dr Bea Graff  . COPD (chronic obstructive pulmonary disease)   . Pneumonia     "today and several times before" (08/30/2012)  . Chronic bronchitis     "q yr w/season changes" (08/30/2012)  . Type II diabetes mellitus     Levemir 2units daily if > 150  . History of blood transfusion    "several" (08/30/2012)  . Hepatitis C   . Migraine     "last migraine was in 2013" (08/30/2012)  . Headache     "at least monthly" (08/30/2012)  . Arthritis     "left hand, back" (08/30/2012)  . End stage renal disease on dialysis 02/27/2011    Kidneys shut down at time of liver transplant in Sept 2011 at [facility] in [place], she has been on HD ever since.  Dialyzes at [facility] HD on TTS schedule.  Had L forearm graft used 10 months then removed Dec 2012 due to suspected infection.  A right upper arm AVG was placed Dec 2012 but she developed steal symptoms acutely and it was ligated the same day.  Never had an AV fistula due to small veins.  Now has L thigh AVG put in Jan 2013, has not clotted to date.    [name] CAD (coronary artery disease) Jan. 2015    Cath: 20% LAD, 50% D1; s/p LIMA-LAD  . Aortic stenosis   . Tobacco abuse   . S/P aortic valve replacement 03/15/13    Mechanical     Medications:  Prescriptions prior to admission  Medication Sig Dispense Refill Last Dose  . albuterol (PROVENTIL,VENTOLIN) 90 MCG/ACT inhaler Inhale 1-2  puffs into the lungs every 6 (six) hours as needed for wheezing or shortness of breath.    Past Week at Unknown time  . artificial tears (LACRILUBE) OINT ophthalmic ointment Place 1 application into the right eye every 4 (four) hours. Apply under right eye lid after administering eye drops   Past Week at Unknown time  . aspirin EC 81 MG tablet Take 81 mg by mouth daily.     03/29/2014 at Unknown time  . calcium acetate (PHOSLO) 667 MG capsule Take 667-2,001 mg by mouth See admin instructions. Take 3 capsules (2001 mg) with meals and 1 capsule (667 mg) with snacks   03/29/2014 at Unknown time  . cinacalcet (SENSIPAR) 30 MG tablet Take 30 mg by mouth daily.   03/29/2014 at Unknown time  . dorzolamide-timolol (COSOPT) 22.3-6.8 MG/ML ophthalmic solution Place 1 drop into the right eye every 4 (four) hours. Follow with artificial tears ointment under eye lid   03/29/2014 at Unknown  time  . gabapentin (NEURONTIN) 300 MG capsule Take 300 mg by mouth at bedtime.    03/29/2014 at Unknown time  . insulin aspart (NOVOLOG) 100 UNIT/ML injection Inject 1-3 Units into the skin 3 (three) times daily as needed for high blood sugar (CBG>150). Per sliding scale   03/29/2014 at Unknown time  . levothyroxine (SYNTHROID, LEVOTHROID) 150 MCG tablet Take 150 mcg by mouth daily before breakfast.   03/29/2014 at Unknown time  . magnesium oxide (MAG-OX) 400 MG tablet Take 400 mg by mouth daily.   03/29/2014 at Unknown time  . metoCLOPramide (REGLAN) 10 MG tablet Take 10 mg by mouth 3 (three) times daily before meals.   03/29/2014 at Unknown time  . metoprolol tartrate (LOPRESSOR) 25 MG tablet Take 25 mg by mouth 2 (two) times daily.    03/29/2014 at 7p  . ondansetron (ZOFRAN-ODT) 4 MG disintegrating tablet Take 4 mg by mouth every 8 (eight) hours as needed for nausea or vomiting.   03/29/2014 at Unknown time  . tacrolimus (PROGRAF) 1 MG capsule Take 3 mg by mouth 2 (two) times daily.    03/29/2014 at Unknown time  . traMADol (ULTRAM) 50 MG tablet Take 1 tablet (50 mg total) by mouth every 12 (twelve) hours as needed for moderate pain. 20 tablet 0 03/29/2014 at Unknown time  . warfarin (COUMADIN) 4 MG tablet Take 1 tablet (4 mg total) by mouth daily. (Patient taking differently: Take 4 mg by mouth daily at 6 PM. ) 30 tablet 3 03/29/2014 at 6p  . nitroGLYCERIN (NITROSTAT) 0.4 MG SL tablet Place 1 tablet (0.4 mg total) under the tongue every 5 (five) minutes as needed for chest pain. 30 tablet 0 unknown   Scheduled:  . sodium chloride   Intravenous Once  . insulin aspart  0-9 Units Subcutaneous 6 times per day  . lidocaine-EPINEPHrine  20 mL Infiltration Once  . sodium chloride  3 mL Intravenous Q12H   Infusions:    Assessment: 59yo female with history of ESRD on HD TTS and mechanical AVR presents with uncontrolled bleeding of left thigh AV graft since HD this AM. Pharmacy is consulted to dose heparin and  warfarin for mechanical AVR. Pt received phytonadione 10mg  IV at 1552 and 3u of FFP. INR on admission 2.77, Hgb 10.2, Plt 91.  There was no further active bleeding at the end of the case per vascular surg note.  PTA Warfarin dose: 4mg  daily  Pt was started on Zinacef 1.5g IV q12h for 2 doses for  surgical prophylaxis. Pharmacy is consulted to renally adjust antibiotics.   Goal of Therapy:  INR 2-3 Heparin level 0.3-0.7 units/ml Monitor platelets by anticoagulation protocol: Yes  Prevention of infection   Plan:  Hold warfarin tonight and restart 2/19  Start heparin infusion at 900 units/hr at 0600 on 2/19 Check anti-Xa level in 8 hours and daily while on heparin Continue to monitor H&H and platelets  Monitor s/sx of bleeding  Change Zinacef to 1.5g IV q24h x 1  Andrey Cota. Diona Foley, PharmD Clinical Pharmacist Pager [phone] 03/30/2014,8:30 PM

## 2014-03-30 NOTE — Anesthesia Preprocedure Evaluation (Signed)
Anesthesia Evaluation  Patient identified by MRN, date of birth, ID band Patient awake    Reviewed: Allergy & Precautions, NPO status , Patient's Chart, lab work & pertinent test results  Airway Mallampati: III  TM Distance: >3 FB Neck ROM: Full    Dental  (+) Edentulous Upper, Edentulous Lower   Pulmonary asthma , sleep apnea , COPDCurrent Smoker,  breath sounds clear to auscultation- rhonchi        Cardiovascular hypertension, + CAD, + CABG and +CHF + Valvular Problems/Murmurs (s/p mechanical AVR. EF 50%.) Rhythm:Regular Rate:Normal     Neuro/Psych  Headaches, Anxiety Depression    GI/Hepatic GERD-  ,(+) Hepatitis -, C  Endo/Other  diabetesHypothyroidism   Renal/GU Dialysis and ESRFRenal disease     Musculoskeletal  (+) Arthritis -,   Abdominal   Peds  Hematology  (+) anemia ,   Anesthesia Other Findings   Reproductive/Obstetrics                             Anesthesia Physical Anesthesia Plan  ASA: III and emergent  Anesthesia Plan: General   Post-op Pain Management:    Induction: Intravenous  Airway Management Planned: Oral ETT  Additional Equipment:   Intra-op Plan:   Post-operative Plan: Extubation in OR  Informed Consent: I have reviewed the patients History and Physical, chart, labs and discussed the procedure including the risks, benefits and alternatives for the proposed anesthesia with the patient or authorized representative who has indicated his/her understanding and acceptance.   Dental advisory given  Plan Discussed with: CRNA  Anesthesia Plan Comments:         Anesthesia Quick Evaluation

## 2014-03-30 NOTE — ED Notes (Signed)
Notified OR and Dr. Bridgett Larsson that pt has started bleeding again. Pt has received one unite FFP. Second unit started. Dr. Bridgett Larsson gave orders to bring pt to OR

## 2014-03-30 NOTE — Telephone Encounter (Signed)
Pt in ED for evaluation, may need operation for graft site.  Advised to keep monitor on-hand and resume wearing when she is discharged. Advised to call [facility] for any technical advice. Caller voiced understanding.

## 2014-03-30 NOTE — H&P (Addendum)
Hospitalist Admission History and Physical  Patient name: [name] Medical record number: [id number] Date of birth: Jul 19, 1955 Age: 59 y.o. Gender: female  Primary Care Provider: No primary care provider on file.  Chief Complaint: bleeding from thigh graft, supratherapeutic INR  History of Present Illness:This is a 59 y.o. year old female with significant past medical history of ESRD on HD TTS,  Hep C cirrhosis s/p liver transplant, CAD, aortic stenosis s/p mechanical AVR on coumadin, type 2 DM, COPD, tobacco abuse presenting with bleeding from thigh graft, supratherapeutic INR. Pt had dialysis today when she began to have excessive bleeding from dialysis site afterward. A stitch was placed from bleeding site at [facility] that was unsuccessful. Pt was then transported to [facility] for vascular repair w/ vascular surgery via Dr. Bridgett Larsson. INR today 2.77. Was given FFP intraoperatively.  Hemodynamically stable on presentation. Afebrile. hgb 10. Cr 3.9. Plt 91.   Assessment and Plan: [name] is a 59 y.o. year old female presenting with Bleeding from dialysis site   Active Problems:   Renal dialysis device, implant, or graft complication   Bleeding   1- Bleeding from dialysis site -s/p OR  -Heparin gtt per preliminary recs -f/o vascular recs in am   2-ESRD  -s/p HD today -renal consult in am   3-Aortic stenosis s/p mechanical AVR -heparin gtt -follow  4-HTN -BP stable  -cont home regimen   5-Type 2 DM -SSI -A1C  6-Hep C cirrhosis s/p liver transplant  -check LFTs -cont tacrolimus  7- Combined systolic and diastolic heart failure -Euvolemic clincally  -limited hydration -strict Is and Os and daily weights  8-CAD  -s/p CABG -no reported CP  -cont home regimen -tele bed  -follow   9-Anemia -anemia of renal disease -hgb stable  -follow   FEN/GI: NPO for now  Prophylaxis: heparin gtt  Disposition: pending further evaluation  Code Status:Full Code     Patient Active Problem List   Diagnosis Date Noted  . Renal dialysis device, implant, or graft complication 89/21/1941  . Bleeding 03/30/2014  . Chest pain 03/19/2014  . Acute hyperkalemia   . Dyspnea 03/07/2014  . Pain in the chest   . S/P liver transplant   . Chronic hepatitis C without hepatic coma   . Diastolic CHF, chronic   . Thyroid activity decreased   . ESRD (end stage renal disease) 01/03/2014  . Pulmonary edema 01/03/2014  . Hyperkalemia 01/03/2014  . Respiratory failure 01/03/2014  . Evaluate for Sleep apnea 12/31/2013  . DM type 2 (diabetes mellitus, type 2) 12/15/2013  . Unstable angina 12/15/2013  . Pleural effusion 07/03/2013  . Paroxysmal supraventricular tachycardia, s/p valve replacement, with chest pain. resolved as outpatient 05/13/2013  . 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 Long term (current) use of anticoagulants 04/22/2013  . Chronic anticoagulation w/ coumadin, goal INR 2.5-3.0 w/ AVR 04/14/2013  . S/P mechanical AVR Feb 2015 03/15/2013  . Aortic stenosis 03/09/2013  . Palpitations- run of tachycardia after HD, lasted 15-20 minutes 03/08/2013  . Tobacco abuse 02/28/2013  . COPD (chronic obstructive pulmonary disease) 02/21/2013  . Hypoxia 02/21/2013  . Chronic combined systolic and diastolic congestive heart failure 02/06/2013  . Acute respiratory failure 11/02/2012  . Acute pulmonary edema 11/02/2012  . Acute on chronic systolic CHF (congestive heart failure) 11/02/2012  . HCAP (healthcare-associated pneumonia) 08/31/2012  . COPD exacerbation 08/31/2012  . Dyslipidemia-LDL 104, not on statin with Hx of liver transplant 07/30/2012  . Abn EKG-  related to LVH, repol changes 07/30/2012  . CAD- s/p LIMA-LAD Feb. 2015 07/27/2012  . Aortic stenosis, moderate-severe at cath Jan 2015. s/p Mechanical AVR 03/2013 07/27/2012  . End stage renal disease on dialysis-TTS 02/27/2011  . HEPATITIS C- s/p liver transplant 2011 06/25/2006  . Hypothyroidism  06/25/2006  . DM2 (diabetes mellitus, type 2) 06/25/2006  . ANEMIA, CHRONIC 06/25/2006  . HTN- LVH, EF45- 50%. grade 2 diastolic dysf. echo 5/80/99 06/25/2006  . GERD 06/25/2006   Past Medical History: Past Medical History  Diagnosis Date  . S/P liver transplant     2011 at [facility] (cirrhosis due to hep C, got hep C from blood transfuion in 1980's per pt))  . Chronic back pain   . CAD (coronary artery disease)   . Obesity   . Peripheral vascular disease hands and legs  . Anxiety   . Asthma   . GERD (gastroesophageal reflux disease)     takes Omeprazole daily  . Chronic constipation     takes MIralax and Colace daily  . Anemia     takes Folic Acid daily  . Hypothyroidism     takes Synthroid daily  . Depression     takes Cymbalta for "severe" depression  . Neuromuscular disorder     carpal tunnel in right hand  . Hypertension     takes Metoprolol and Lisinopril daily, sees Dr Bea Graff  . COPD (chronic obstructive pulmonary disease)   . Pneumonia     "today and several times before" (08/30/2012)  . Chronic bronchitis     "q yr w/season changes" (08/30/2012)  . Type II diabetes mellitus     Levemir 2units daily if > 150  . History of blood transfusion     "several" (08/30/2012)  . Hepatitis C   . Migraine     "last migraine was in 2013" (08/30/2012)  . Headache     "at least monthly" (08/30/2012)  . Arthritis     "left hand, back" (08/30/2012)  . End stage renal disease on dialysis 02/27/2011    Kidneys shut down at time of liver transplant in Sept 2011 at [facility] in [place], she has been on HD ever since.  Dialyzes at [facility] HD on TTS schedule.  Had L forearm graft used 10 months then removed Dec 2012 due to suspected infection.  A right upper arm AVG was placed Dec 2012 but she developed steal symptoms acutely and it was ligated the same day.  Never had an AV fistula due to small veins.  Now has L thigh AVG put in Jan 2013, has not clotted to date.    [name] CAD (coronary artery  disease) Jan. 2015    Cath: 20% LAD, 50% D1; s/p LIMA-LAD  . Aortic stenosis   . Tobacco abuse   . S/P aortic valve replacement 03/15/13    Mechanical     Past Surgical History: Past Surgical History  Procedure Laterality Date  . Liver transplant  10/25/2009    sees Dr Ferol Luz 1 every 6 months, saw last in Dec 2013. [name] Coord [phone]  . Small intestine surgery  90's  . Thrombectomy    . Arteriovenous graft placement Left 10/03/10     forearm  . Avgg removal  12/23/2010    Procedure: REMOVAL OF ARTERIOVENOUS GORETEX GRAFT ([place]);  Surgeon: Elam Dutch, MD;  Location: [place];  Service: Vascular;  Laterality: Left;  procedure started @1736 -1852  . Insertion of dialysis catheter  12/23/2010  Procedure: INSERTION OF DIALYSIS CATHETER;  Surgeon: Elam Dutch, MD;  Location: [place];  Service: Vascular;  Laterality: Right;  Right Internal Jugular 28cm dialysis catheter insertion procedure time 1701-1720   . Cholecystectomy  1993  . Cystoscopy  1990's  . Spinal growth rods  2010    "put 2 metal rods in my back; they had detetriorated" (08/30/2012)  . Av fistula placement  01/29/2011    Procedure: INSERTION OF ARTERIOVENOUS (AV) GORE-TEX GRAFT ARM;  Surgeon: Elam Dutch, MD;  Location: [place];  Service: Vascular;  Laterality: Right;  . Av fistula placement  03/10/2011    Procedure: INSERTION OF ARTERIOVENOUS (AV) GORE-TEX GRAFT THIGH;  Surgeon: Elam Dutch, MD;  Location: [place];  Service: Vascular;  Laterality: Left;  . Tubal ligation  1990's  . Cardiac catheterization  2014  . Aortic valve replacement N/A 03/15/2013    AVR; Surgeon: Ivin Poot, MD;  Location: [facility] OR; Open Heart Surgery;  54mm[employer] mechanical prosthesis, top hat valve  . Coronary artery bypass graft N/A 03/15/2013    Procedure: CORONARY ARTERY BYPASS GRAFTING (CABG) times one using left internal mammary artery.;  Surgeon: Ivin Poot, MD;  Location: [place];  Service: Open Heart Surgery;   Laterality: N/A;  POSS CABG X 1  . Intraoperative transesophageal echocardiogram N/A 03/15/2013    Procedure: INTRAOPERATIVE TRANSESOPHAGEAL ECHOCARDIOGRAM;  Surgeon: Ivin Poot, MD;  Location: [place];  Service: Open Heart Surgery;  Laterality: N/A;  . Left heart catheterization with coronary/graft angiogram N/A 12/24/2011    Procedure: LEFT HEART CATHETERIZATION WITH Beatrix Fetters;  Surgeon: [name], MD;  Location: [facility] CATH LAB;  Service: Cardiovascular;  Laterality: N/A;  . Left heart catheterization with coronary angiogram N/A 07/29/2012    Procedure: LEFT HEART CATHETERIZATION WITH CORONARY ANGIOGRAM;  Surgeon: Troy Sine, MD;  Location: [facility] CATH LAB;  Service: Cardiovascular;  Laterality: N/A;  . Left heart catheterization with coronary angiogram N/A 03/10/2013    Procedure: LEFT HEART CATHETERIZATION WITH CORONARY ANGIOGRAM;  Surgeon: Troy Sine, MD;  Location: [facility] CATH LAB;  Service: Cardiovascular;  Laterality: N/A;  . Left heart catheterization with coronary/graft angiogram N/A 12/16/2013    Procedure: LEFT HEART CATHETERIZATION WITH Beatrix Fetters;  Surgeon: Troy Sine, MD;  Location: [facility] CATH LAB;  Service: Cardiovascular;  Laterality: N/A;    Social History: History   Social History  . Marital Status: Divorced    Spouse Name: N/A  . Number of Children: N/A  . Years of Education: N/A   Social History Main Topics  . Smoking status: Current Some Day Smoker -- 0.75 packs/day for 40 years    Types: Cigarettes    Last Attempt to Quit: 03/13/2013  . Smokeless tobacco: Never Used     Comment: still smokng 1 or 2 a day  . Alcohol Use: Yes     Comment: 08/30/2012 "last drink was at a wedding July, 2014; had a small glass of wine; never had problems w/alcohol"  . Drug Use: No  . Sexual Activity: Not Currently   Other Topics Concern  . None   Social History Narrative    Family History: Family History  Problem Relation Age of Onset  .  Cancer Mother   . Diabetes Mother   . Hypertension Mother   . Stroke Mother   . Cancer Father   . Anesthesia problems Neg Hx   . Hypotension Neg Hx   . Malignant hyperthermia Neg Hx   .  Pseudochol deficiency Neg Hx     Allergies: Allergies  Allergen Reactions  . Acetaminophen Other (See Comments)    Liver transplant recipient   . Codeine Itching  . Mirtazapine Other (See Comments)    hallucination  . Morphine Itching    Current Facility-Administered Medications  Medication Dose Route Frequency Provider Last Rate Last Dose  . 0.9 %  sodium chloride infusion   Intravenous Once Conrad Gadsden, MD      . fentaNYL (SUBLIMAZE) injection 25-50 mcg  25-50 mcg Intravenous Q5 min PRN Tiajuana Amass, MD      . insulin aspart (novoLOG) injection 0-9 Units  0-9 Units Subcutaneous 6 times per day Shanda Howells, MD      . lidocaine-EPINEPHrine (XYLOCAINE W/EPI) 1 %-1:100000 (with pres) injection 20 mL  20 mL Infiltration Once Alvia Grove, PA-C      . morphine 2 MG/ML injection 2-4 mg  2-4 mg Intravenous Q2H PRN Shanda Howells, MD      . promethazine (PHENERGAN) injection 6.25-12.5 mg  6.25-12.5 mg Intravenous Q15 min PRN Tiajuana Amass, MD      . sodium chloride 0.9 % injection 3 mL  3 mL Intravenous Q12H Shanda Howells, MD       Review Of Systems: 12 point ROS negative except as noted above in HPI.  Physical Exam: Filed Vitals:   03/30/14 2005  BP: 156/104  Pulse: 82  Temp: 97.7 F (36.5 C)  Resp: 17    General: cooperative and somnolent s/p OR  HEENT: PERRLA and extra ocular movement intact Heart: S1, S2 normal, no murmur, rub or gallop, regular rate and rhythm Lungs: clear to auscultation, no wheezes or rales and unlabored breathing Abdomen: abdomen is soft without significant tenderness, masses, organomegaly or guarding Extremities: L upper thigh w/ mild scarring post surgically  Skin:as above  Neurology: somnolent, grossly normal   Labs and Imaging: Lab Results   Component Value Date/Time   NA 137 03/30/2014 04:49 PM   K 4.3 03/30/2014 04:49 PM   CL 96 03/30/2014 04:49 PM   CO2 31 03/19/2014 02:00 PM   BUN 11 03/30/2014 04:49 PM   CREATININE 3.90* 03/30/2014 04:49 PM   GLUCOSE 146* 03/30/2014 04:49 PM   Lab Results  Component Value Date   WBC 5.1 03/30/2014   HGB 10.2* 03/30/2014   HCT 30.0* 03/30/2014   MCV 99.3 03/30/2014   PLT 91* 03/30/2014    No results found.         Shanda Howells MD  Pager: [phone]

## 2014-03-30 NOTE — Telephone Encounter (Signed)
Pt started wearing a 30 days monitor on Tuesday,she was admitted to the hospital today. Daughter wants to know what to do about the monitor.

## 2014-03-30 NOTE — ED Provider Notes (Signed)
CSN: [id number]     Arrival date & time 03/30/14  1514 History   First MD Initiated Contact with Patient 03/30/14 1517     Chief Complaint  Patient presents with  . Vascular Access Problem     (Consider location/radiation/quality/duration/timing/severity/associated sxs/prior Treatment) HPI   [name] is a 59 y.o. female who is here for evaluation of bleeding dialysis graft.  The graft started bleeding after she went home following her dialysis, this morning.  She completed her dialysis at 10:30 AM.  She went to an outlying emergency department to try to suture the skin over the graft, but the wound Bleeding.  She was therefore sent here for treatment.  EMS reports that her blood pressure has been normal, at the outlying facility, and during transport.  The patient is asymptomatic other than bleeding.  She denies headache, chest pain, weakness, dizziness, nausea or vomiting.  Her graft in her left leg is about 60 years old.  She has not previously had trouble with it.  There are no other known modifying factors.   Past Medical History  Diagnosis Date  . 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 Chronic back pain   . CAD (coronary artery disease)   . Obesity   . Peripheral vascular disease hands and legs  . Anxiety   . Asthma   . GERD (gastroesophageal reflux disease)     takes Omeprazole daily  . Chronic constipation     takes MIralax and Colace daily  . Anemia     takes Folic Acid daily  . Hypothyroidism     takes Synthroid daily  . Depression     takes Cymbalta for "severe" depression  . Neuromuscular disorder     carpal tunnel in right hand  . Hypertension     takes Metoprolol and Lisinopril daily, sees Dr Bea Graff  . COPD (chronic obstructive pulmonary disease)   . Pneumonia     "today and several times before" (08/30/2012)  . Chronic bronchitis     "q yr w/season changes" (08/30/2012)  . Type II  diabetes mellitus     Levemir 2units daily if > 150  . History of blood transfusion     "several" (08/30/2012)  . Hepatitis C   . Migraine     "last migraine was in 2013" (08/30/2012)  . Headache     "at least monthly" (08/30/2012)  . Arthritis     "left hand, back" (08/30/2012)  . End stage renal disease on dialysis 02/27/2011    Kidneys shut down at time of liver transplant in Sept 2011 at [facility] in [place], she has been on HD ever since.  Dialyzes at [facility] HD on TTS schedule.  Had L forearm graft used 10 months then removed Dec 2012 due to suspected infection.  A right upper arm AVG was placed Dec 2012 but she developed steal symptoms acutely and it was ligated the same day.  Never had an AV fistula due to small veins.  Now has L thigh AVG put in Jan 2013, has not clotted to date.    [name] CAD (coronary artery disease) Jan. 2015    Cath: 20% LAD, 50% D1; s/p LIMA-LAD  . Aortic stenosis   . Tobacco abuse   . S/P aortic valve replacement 03/15/13    Mechanical    Past Surgical History  Procedure Laterality Date  .  Liver transplant  10/25/2009    sees Dr Ferol Luz 1 every 6 months, saw last in Dec 2013. [name] Coord [phone]  . Small intestine surgery  90's  . Thrombectomy    . Arteriovenous graft placement Left 10/03/10     forearm  . Avgg removal  12/23/2010    Procedure: REMOVAL OF ARTERIOVENOUS GORETEX GRAFT ([place]);  Surgeon: Elam Dutch, MD;  Location: [place];  Service: Vascular;  Laterality: Left;  procedure started @1736 -3716  . Insertion of dialysis catheter  12/23/2010    Procedure: INSERTION OF DIALYSIS CATHETER;  Surgeon: Elam Dutch, MD;  Location: [place];  Service: Vascular;  Laterality: Right;  Right Internal Jugular 28cm dialysis catheter insertion procedure time 1701-1720   . Cholecystectomy  1993  . Cystoscopy  1990's  . Spinal growth rods  2010    "put 2 metal rods in my back; they had detetriorated" (08/30/2012)  . Av fistula placement  01/29/2011     Procedure: INSERTION OF ARTERIOVENOUS (AV) GORE-TEX GRAFT ARM;  Surgeon: Elam Dutch, MD;  Location: [place];  Service: Vascular;  Laterality: Right;  . Av fistula placement  03/10/2011    Procedure: INSERTION OF ARTERIOVENOUS (AV) GORE-TEX GRAFT THIGH;  Surgeon: Elam Dutch, MD;  Location: [place];  Service: Vascular;  Laterality: Left;  . Tubal ligation  1990's  . Aortic valve replacement N/A 03/15/2013    AVR; Surgeon: Ivin Poot, MD;  Location: [facility] OR; Open Heart Surgery;  6mm[employer] mechanical prosthesis, top hat valve  . Coronary artery bypass graft N/A 03/15/2013    Procedure: CORONARY ARTERY BYPASS GRAFTING (CABG) times one using left internal mammary artery.;  Surgeon: Ivin Poot, MD;  Location: [place];  Service: Open Heart Surgery;  Laterality: N/A;  POSS CABG X 1  . Intraoperative transesophageal echocardiogram N/A 03/15/2013    Procedure: INTRAOPERATIVE TRANSESOPHAGEAL ECHOCARDIOGRAM;  Surgeon: Ivin Poot, MD;  Location: [place];  Service: Open Heart Surgery;  Laterality: N/A;  . Left heart catheterization with coronary/graft angiogram N/A 12/24/2011    Procedure: LEFT HEART CATHETERIZATION WITH Beatrix Fetters;  Surgeon: [name], MD;  Location: [facility] CATH LAB;  Service: Cardiovascular;  Laterality: N/A;  . Left heart catheterization with coronary angiogram N/A 07/29/2012    Procedure: LEFT HEART CATHETERIZATION WITH CORONARY ANGIOGRAM;  Surgeon: Troy Sine, MD;  Location: [facility] CATH LAB;  Service: Cardiovascular;  Laterality: N/A;  . Left heart catheterization with coronary angiogram N/A 03/10/2013    Procedure: LEFT HEART CATHETERIZATION WITH CORONARY ANGIOGRAM;  Surgeon: Troy Sine, MD;  Location: [facility] CATH LAB;  Service: Cardiovascular;  Laterality: N/A;  . Left heart catheterization with coronary/graft angiogram N/A 12/16/2013    Procedure: LEFT HEART CATHETERIZATION WITH Beatrix Fetters;  Surgeon: Troy Sine, MD;  Location: [facility] CATH LAB;   Service: Cardiovascular;  Laterality: N/A;  . Thrombectomy and revision of arterioventous (av) goretex  graft Left 03/30/2014   Family History  Problem Relation Age of Onset  . Cancer Mother   . Diabetes Mother   . Hypertension Mother   . Stroke Mother   . Cancer Father   . Anesthesia problems Neg Hx   . Hypotension Neg Hx   . Malignant hyperthermia Neg Hx   . Pseudochol deficiency Neg Hx    History  Substance Use Topics  . Smoking status: Current Some Day Smoker -- 0.75 packs/day for 40 years    Types: Cigarettes    Last Attempt to Quit: 03/13/2013  .  Smokeless tobacco: Never Used     Comment: still smokng 1 or 2 a day  . Alcohol Use: Yes     Comment: 08/30/2012 "last drink was at a wedding July, 2014; had a small glass of wine; never had problems w/alcohol"   OB History    No data available     Review of Systems  All other systems reviewed and are negative.     Allergies  Acetaminophen; Codeine; Mirtazapine; and Morphine  Home Medications   Prior to Admission medications   Medication Sig Start Date End Date Taking? Authorizing Provider  albuterol (PROVENTIL,VENTOLIN) 90 MCG/ACT inhaler Inhale 1-2 puffs into the lungs every 6 (six) hours as needed for wheezing or shortness of breath.    Yes Historical Provider, MD  artificial tears (LACRILUBE) OINT ophthalmic ointment Place 1 application into the right eye every 4 (four) hours. Apply under right eye lid after administering eye drops   Yes Historical Provider, MD  aspirin EC 81 MG tablet Take 81 mg by mouth daily.     Yes Historical Provider, MD  calcium acetate (PHOSLO) 667 MG capsule Take 667-2,001 mg by mouth See admin instructions. Take 3 capsules (2001 mg) with meals and 1 capsule (667 mg) with snacks   Yes Historical Provider, MD  cinacalcet (SENSIPAR) 30 MG tablet Take 30 mg by mouth daily.   Yes Historical Provider, MD  dorzolamide-timolol (COSOPT) 22.3-6.8 MG/ML ophthalmic solution Place 1 drop into the right eye  every 4 (four) hours. Follow with artificial tears ointment under eye lid 03/16/14  Yes Historical Provider, MD  gabapentin (NEURONTIN) 300 MG capsule Take 300 mg by mouth at bedtime.    Yes Historical Provider, MD  insulin aspart (NOVOLOG) 100 UNIT/ML injection Inject 1-3 Units into the skin 3 (three) times daily as needed for high blood sugar (CBG>150). Per sliding scale   Yes Historical Provider, MD  levothyroxine (SYNTHROID, LEVOTHROID) 150 MCG tablet Take 150 mcg by mouth daily before breakfast.   Yes Historical Provider, MD  magnesium oxide (MAG-OX) 400 MG tablet Take 400 mg by mouth daily.   Yes Historical Provider, MD  metoCLOPramide (REGLAN) 10 MG tablet Take 10 mg by mouth 3 (three) times daily before meals.   Yes Historical Provider, MD  metoprolol tartrate (LOPRESSOR) 25 MG tablet Take 25 mg by mouth 2 (two) times daily.  03/16/14  Yes Historical Provider, MD  ondansetron (ZOFRAN-ODT) 4 MG disintegrating tablet Take 4 mg by mouth every 8 (eight) hours as needed for nausea or vomiting.   Yes Historical Provider, MD  tacrolimus (PROGRAF) 1 MG capsule Take 3 mg by mouth 2 (two) times daily.    Yes Historical Provider, MD  traMADol (ULTRAM) 50 MG tablet Take 1 tablet (50 mg total) by mouth every 12 (twelve) hours as needed for moderate pain. 03/20/14  Yes Phillips Climes, MD  warfarin (COUMADIN) 4 MG tablet Take 1 tablet (4 mg total) by mouth daily. Patient taking differently: Take 4 mg by mouth daily at 6 PM.  02/24/14  Yes Alexa Marvel Plan, MD  nitroGLYCERIN (NITROSTAT) 0.4 MG SL tablet Place 1 tablet (0.4 mg total) under the tongue every 5 (five) minutes as needed for chest pain. 03/20/14   Dawood Elgergawy, MD   BP 145/53 mmHg  Pulse 60  Temp(Src) 98.1 F (36.7 C) (Oral)  Resp 14  Ht 5\' 4"  (1.626 m)  Wt 165 lb (74.844 kg)  BMI 28.31 kg/m2  SpO2 100% Physical Exam  Constitutional: She is oriented to person,  place, and time. She appears well-developed and well-nourished. No distress.   HENT:  Head: Normocephalic and atraumatic.  Right Ear: External ear normal.  Left Ear: External ear normal.  Eyes: Conjunctivae and EOM are normal. Pupils are equal, round, and reactive to light.  Neck: Normal range of motion and phonation normal. Neck supple.  Cardiovascular: Normal rate, regular rhythm and normal heart sounds.   Probable graft with pulse in left proximal anterior thigh.  One dressing was removed and reveals a minimally bleeding puncture wound, consistent with recent dialysis.  The more lateral puncture wound is actively bleeding.  A stream of 1-2 mm of blood when pressure is left off.  Immediate, treatment to control the bleeding consisted of direct pressure with a single 4 x 4 folded 2 times.  The bleeding was easily controlled and there was no swelling of the thigh with this maneuver.  Frequent checks revealed that the bleeding was unable to be controlled with direct pressure only.  Therefore, vascular surgery was consulted for operative repair  Pulmonary/Chest: Effort normal and breath sounds normal. She exhibits no bony tenderness.  Abdominal: Soft. There is no tenderness.  Musculoskeletal: Normal range of motion.  Neurological: She is alert and oriented to person, place, and time. No cranial nerve deficit or sensory deficit. She exhibits normal muscle tone. Coordination normal.  Skin: Skin is warm, dry and intact.  Psychiatric: She has a normal mood and affect. Her behavior is normal. Judgment and thought content normal.  Nursing note and vitals reviewed.   ED Course  Procedures (including critical care time) The patient reports that she does not with dialysis.  She does take Coumadin and has been taking it regularly.  Therefore, FFP and vitamin K were ordered to reverse the Coumadin anticoagulation.  Medications  0.9 %  sodium chloride infusion (not administered)  dorzolamide-timolol (COSOPT) 22.3-6.8 MG/ML ophthalmic solution 1 drop (1 drop Right Eye Given 03/30/14  2238)  metoCLOPramide (REGLAN) tablet 10 mg (not administered)  metoprolol tartrate (LOPRESSOR) tablet 25 mg (25 mg Oral Given 03/30/14 2236)  nitroGLYCERIN (NITROSTAT) SL tablet 0.4 mg (not administered)  traMADol (ULTRAM) tablet 50 mg (not administered)  ondansetron (ZOFRAN-ODT) disintegrating tablet 4 mg (not administered)  tacrolimus (PROGRAF) capsule 3 mg (3 mg Oral Given 03/30/14 2237)  cinacalcet (SENSIPAR) tablet 30 mg (not administered)  gabapentin (NEURONTIN) capsule 300 mg (300 mg Oral Given 03/30/14 2237)  levothyroxine (SYNTHROID, LEVOTHROID) tablet 150 mcg (not administered)  albuterol (PROVENTIL) (2.5 MG/3ML) 0.083% nebulizer solution 3 mL (not administered)  aspirin EC tablet 81 mg (81 mg Oral Given 03/30/14 2237)  0.9 %  sodium chloride infusion (not administered)  docusate sodium (COLACE) capsule 100 mg (not administered)  ondansetron (ZOFRAN) injection 4 mg (not administered)  alum & mag hydroxide-simeth (MAALOX/MYLANTA) 200-200-20 MG/5ML suspension 15-30 mL (not administered)  pantoprazole (PROTONIX) EC tablet 40 mg (not administered)  labetalol (NORMODYNE,TRANDATE) injection 10 mg (not administered)  hydrALAZINE (APRESOLINE) injection 5 mg (not administered)  metoprolol (LOPRESSOR) injection 2-5 mg (not administered)  guaiFENesin-dextromethorphan (ROBITUSSIN DM) 100-10 MG/5ML syrup 15 mL (not administered)  phenol (CHLORASEPTIC) mouth spray 1 spray (not administered)  oxyCODONE-acetaminophen (PERCOCET/ROXICET) 5-325 MG per tablet 1 tablet (not administered)  morphine 2 MG/ML injection 2-5 mg (3 mg Intravenous Given 03/30/14 2342)  bisacodyl (DULCOLAX) suppository 10 mg (not administered)  sodium chloride 0.9 % injection 3 mL (3 mLs Intravenous Given 03/30/14 2200)  morphine 2 MG/ML injection 2-4 mg (not administered)  insulin aspart (novoLOG) injection 0-9 Units (0 Units  Subcutaneous Not Given 03/30/14 2030)  heparin ADULT infusion 100 units/mL (25000 units/250 mL) (not  administered)  artificial tears (LACRILUBE) ophthalmic ointment 1 application (1 application Right Eye Given 03/30/14 2237)  calcium acetate (PHOSLO) capsule 2,001 mg (not administered)  calcium acetate (PHOSLO) capsule 667 mg (not administered)  phytonadione (VITAMIN K) 10 mg in dextrose 5 % 50 mL IVPB (0 mg Intravenous Stopped 03/30/14 1652)  0.9 %  sodium chloride infusion ( Intravenous Anesthesia Volume Adjustment 03/30/14 1957)  cefUROXime (ZINACEF) 1.5 g in dextrose 5 % 50 mL IVPB (1.5 g Intravenous Given 03/30/14 2309)    Patient Vitals for the past 24 hrs:  BP Temp Temp src Pulse Resp SpO2 Height Weight  03/30/14 2136 (!) 145/53 mmHg 98.1 F (36.7 C) Oral 60 14 100 % - -  03/30/14 2105 (!) 149/43 mmHg 97.6 F (36.4 C) - 63 13 100 % - -  03/30/14 2050 (!) 147/38 mmHg - - 61 13 100 % - -  03/30/14 2035 (!) 134/44 mmHg - - 64 17 100 % - -  03/30/14 2020 (!) 136/38 mmHg - - 66 14 100 % - -  03/30/14 2015 (!) 166/42 mmHg - - 69 (!) 8 100 % - -  03/30/14 2005 (!) 156/104 mmHg 97.7 F (36.5 C) - 82 17 100 % - -  03/30/14 1733 (!) 164/50 mmHg - - 68 11 100 % - -  03/30/14 1715 (!) 161/37 mmHg 97.6 F (36.4 C) Oral 69 15 99 % - -  03/30/14 1700 (!) 146/43 mmHg - - 70 17 99 % - -  03/30/14 1646 (!) 155/44 mmHg - - 73 14 100 % - -  03/30/14 1640 (!) 140/42 mmHg - - 70 14 97 % - -  03/30/14 1600 166/56 mmHg - - 65 15 98 % - -  03/30/14 1545 (!) 173/53 mmHg - - 65 14 98 % - -  03/30/14 1530 167/55 mmHg - - 64 15 98 % - -  03/30/14 1520 153/57 mmHg 97.9 F (36.6 C) Oral - 13 98 % 5\' 4"  (1.626 m) 165 lb (74.844 kg)   I contacted vascular surgery, Dr. Bridgett Larsson, who stated that he would evaluate the patient estimates he is able to finally in the operating room, and agreed with reversal of Coumadin anticoagulation.  Dr. Bridgett Larsson sent his PA to evaluate the patient.    CRITICAL CARE Performed by: Richarda Blade Total critical care time: 35 minutes Critical care time was exclusive of separately  billable procedures and treating other patients. Critical care was necessary to treat or prevent imminent or life-threatening deterioration. Critical care was time spent personally by me on the following activities: development of treatment plan with patient and/or surrogate as well as nursing, discussions with consultants, evaluation of patient's response to treatment, examination of patient, obtaining history from patient or surrogate, ordering and performing treatments and interventions, ordering and review of laboratory studies, ordering and review of radiographic studies, pulse oximetry and re-evaluation of patient's condition.  Labs Review Labs Reviewed  PROTIME-INR - Abnormal; Notable for the following:    Prothrombin Time 29.5 (*)    INR 2.77 (*)    All other components within normal limits  CBC WITH DIFFERENTIAL/PLATELET - Abnormal; Notable for the following:    RBC 2.83 (*)    Hemoglobin 9.0 (*)    HCT 28.1 (*)    Platelets 91 (*)    All other components within normal limits  GLUCOSE, CAPILLARY - Abnormal; Notable for  the following:    Glucose-Capillary 127 (*)    All other components within normal limits  GLUCOSE, CAPILLARY - Abnormal; Notable for the following:    Glucose-Capillary 111 (*)    All other components within normal limits  I-STAT CHEM 8, ED - Abnormal; Notable for the following:    Creatinine, Ser 3.90 (*)    Glucose, Bld 146 (*)    Calcium, Ion 0.86 (*)    Hemoglobin 10.2 (*)    HCT 30.0 (*)    All other components within normal limits  COMPREHENSIVE METABOLIC PANEL  CBC WITH DIFFERENTIAL/PLATELET  HEMOGLOBIN A1C  CBC  BASIC METABOLIC PANEL  HEPARIN LEVEL (UNFRACTIONATED)  PREPARE FRESH FROZEN PLASMA  PREPARE FRESH FROZEN PLASMA    Imaging Review No results found.   EKG Interpretation None      MDM   Final diagnoses:  Bleeding from dialysis shunt, initial encounter     Bleeding dialysis graft, requiring surgical repair.  Nursing Notes  Reviewed/ Care Coordinated, and agree without changes. Applicable Imaging Reviewed.  Interpretation of Laboratory Data incorporated into ED treatment   Patient taken to the operating room by vascular surgery, she will be admitted after that.    Richarda Blade, MD 03/31/14 [phone]

## 2014-03-30 NOTE — ED Notes (Signed)
Pump, IV tubing, NS 1000 bag and flush handed to [place], RN and suture cart placed in room

## 2014-03-30 NOTE — ED Notes (Signed)
Pt had dialysis this morning at 1030. [name] site did not stop bleeding. Pt transported to [place] [facility] to try to stop bleeding, attempted to place stitch to stop bleeding. No success. Pt has gone through 3 8x10s for EMS en route to [facility]. PT is alert and oriented. Pt has been bleeding continuously since dialysis ended. BP 140/90,O2 100%, HR 60

## 2014-03-30 NOTE — ED Notes (Signed)
Paged Dr. Donnetta Hutching to [phone]

## 2014-03-30 NOTE — ED Notes (Signed)
Belongings (holter monitor, dentures and socks) given to daughter.  Pt transported to holding area by RN and EMT.  Vital signs continue to be stable.  Bleeding controlled upon arrival to holding area.

## 2014-03-30 NOTE — Op Note (Signed)
OPERATIVE NOTE   PROCEDURE: 1. Revision of left thigh arteriovenous graft with interposition graft  PRE-OPERATIVE DIAGNOSIS: acute hemorrhage from left thigh arteriovenous graft   POST-OPERATIVE DIAGNOSIS: same as above   SURGEON: Adele Barthel, MD  ASSISTANT(S): Silva Bandy, PAC   ANESTHESIA: general  ESTIMATED BLOOD LOSS: 50 cc  FINDING(S): 1.  Degenerative graft without any evidence of frank rupture or infection 2.  Palpable thrill at end of the case  SPECIMEN(S):  none  INDICATIONS:   [name] is a 59 y.o. female who presents with acute uncontrolled bleeding from left thigh arteriovenous graft since completing hemodialysis today.  The patient was stabilized by placement of multiple stitches through the skin in the emergency department.  Her anticoagulation was reversed with fresh frozen plasma and patient was taken to the operating room urgently.  I discussed with her proceeding with a thrombectomy and revision of the left thigh arteriovenous graft to try to salvage the thigh graft.  Risk, benefits, and alternatives to access surgery were discussed.  The patient is aware the risks include but are not limited to: bleeding, infection, steal syndrome, nerve damage, ischemic monomelic neuropathy, need for additional procedures, death and stroke.  The patient agrees to proceed forward with the procedure.   DESCRIPTION: After obtaining full informed written consent, the patient was brought back to the operating room and placed supine upon the operating table.  The patient received IV antibiotics prior to induction.  After obtaining adequate anesthesia, the patient was prepped and draped in the standard fashion for: left thigh arteriovenous graft.  I made an incision proximal to the hemorrhaging site, overlying the arterial arm of the graft.  I dissected out the graft with blunt dissection and electrocautery.  I dissected out a ~4 cm segment of graft.  There remained a pulse in this  graft.  The graft appeared without any evidence of infection and appeared to be in good shape.  I then made an incision distal to the hemorrhaging site, overlying the arterial arm of the graft.  I dissected out the graft with blunt dissection and electrocautery.  The graft was densely adherent to the subcutaneous tissue at this location due to multiple prior cannulations.  The graft appeared without any evidence of infection but appeared to be degenerated here.    I dissected a new subcutaneous tunnel lateral to the prior graft with a metal Gore tunneler.  I passed a 6 mm Goretex graft through the metal tunnel and removed the metal tunnel.  I turned my attention to the proximal incision.  I clamped the old graft proximally and made a graftotomy.  I started to pass a 4 Fogarty proximally, but it became evident that there was no thrombus on this side of the graft as there continued to be pulsatile arterial blood flow when I unclamped the graft.  I reclamped and then transected the graft.  I spatulated the old and new graft to facilitate an end-to-end anastomosis.  The new graft was sewn to the old graft with a running stitch of 6-0 Prolene.  I then moved the clamp distal to the new anastomosis.  There appeared to be a loose area in the suture line so I pulled up on the loose segment and completed a tightening maneuver, tying the subsequent to a new stitch.  This appeared to control the bleeding.  The incision was packed with thrombin and gelfoam.  I turned my attention to the distal incision.  In a similar  fashion, the venous arm was explored with a 4 Fogarty.  No thrombus was extracted.  I similarly transected the graft after clamping the distal graft.  I spatulated the old and new graft to facilitate an end-to-end anastomosis.  The new graft was sewn to the old graft with a running stitch of 6-0 Prolene.  Prior to completing this anastomosis, I backbled both ends of the anastomosis.  There was no clot in  either end.  I completed the anastomosis in the usual fashion.  I packed thrombin and gelfoam in this incision.  I removed the gelfoam in the proximal incision and washed out the incision.  There was no further bleeding.  I reapproximated the subcutaneous tissue with a running stitch of 3-0 Vicryl.  The skin was then reapproximated with a running subcuticular stitch of 4-0 Monocryl.  I turned my attention to the distal incision and removed the gelfoam.  I washed out the incision and there was no active bleeding present.   I reapproximated the subcutaneous tissue with a running stitch of 3-0 Vicryl.  The skin was then reapproximated with a running subcuticular stitch of 4-0 Monocryl.    The skin at both incisions was cleaned, dried, and reinforced with Dermabond.  There was an easily palpable thrill at the end of the case.  If this patient has a significant bleed again, I would consider doing a shuntogram with possible venoplasty of the venous anastomosis as there was some resistance to the Fogarty when passing it up the venous arm.  COMPLICATIONS: none  CONDITION: stable  Adele Barthel, MD [facility] [place] Office: [phone] Pager: [phone]  03/30/2014, 7:46 PM

## 2014-03-30 NOTE — Transfer of Care (Signed)
Immediate Anesthesia Transfer of Care Note  Patient: [name]  Procedure(s) Performed: Procedure(s): THROMBECTOMY AND REVISION OF ARTERIOVENTOUS (AV) GORETEX  GRAFT (Left)  Patient Location: PACU  Anesthesia Type:General  Level of Consciousness: awake, alert , oriented, patient cooperative and responds to stimulation  Airway & Oxygen Therapy: Patient Spontanous Breathing and Patient connected to nasal cannula oxygen  Post-op Assessment: Report given to RN, Post -op Vital signs reviewed and stable and Patient moving all extremities X 4  Post vital signs: Reviewed and stable  Last Vitals:  Filed Vitals:   03/30/14 1733  BP: 164/50  Pulse: 68  Temp:   Resp: 11    Complications: No apparent anesthesia complications

## 2014-03-30 NOTE — Progress Notes (Signed)
   Daily Progress Note  Pt acutely bleeding from L thigh AVG (03/10/11).  Pt also supratherapeutic on coumadin.  I placed 2 2-0 Nylon stitches around the thigh graft proximally to get hemostasis.   Will take pt urgently to OR for L thigh AVG thrombectomy and revision of L thigh AVG after transfusion of 4 u FFP to reverse coumadin.  Pt has been NPO today.  Lab Results  Component Value Date   INR 2.8* 03/21/2014   INR 2.21* 03/20/2014   INR 2.08* 03/19/2014   Risk, benefits, and alternatives to access surgery were discussed.  The patient is aware the risks include but are not limited to: bleeding, infection, steal syndrome, nerve damage, ischemic monomelic neuropathy, need for additional procedures, death and stroke.  The patient agrees to proceed forward with the procedure.   Adele Barthel, MD [facility] [place] [facility] Office: [phone] Pager: [phone]  03/30/2014, 4:43 PM

## 2014-03-30 NOTE — Anesthesia Postprocedure Evaluation (Signed)
  Anesthesia Post-op Note  Patient: [name]  Procedure(s) Performed: Procedure(s): THROMBECTOMY AND REVISION OF ARTERIOVENTOUS (AV) GORETEX  GRAFT (Left)  Patient Location: PACU  Anesthesia Type:General  Level of Consciousness: awake and alert   Airway and Oxygen Therapy: Patient Spontanous Breathing  Post-op Pain: none  Post-op Assessment: Post-op Vital signs reviewed  Post-op Vital Signs: Reviewed  Last Vitals:  Filed Vitals:   03/30/14 2136  BP: 145/53  Pulse: 60  Temp: 36.7 C  Resp: 14    Complications: No apparent anesthesia complications

## 2014-03-30 NOTE — H&P (Signed)
Consult  Reason for consult: bleeding left thigh graft  Consulting provider: EDP  History of Present Illness  [name] is a 59 y.o. ([birth year]) female who presents with chief complaint: bleeding of left thigh AV graft since 10:30 this morning. She completed her dialysis this morning and the cannulation sites were noted to not stop bleeding. She was transported to [facility] where a stitch was placed with no success at stopping the bleeding. She was then transported to [facility] via EMS. She reports prior episodes of bleeding in the past. She denies any ulcerations or scabs to her thigh graft. Her graft was originally placed by Dr. Oneida Alar on 03/10/2011.  She is currently on coumadin for a mechanical aortic valve and her last dose was yesterday evening.   Past Medical History  Diagnosis Date  . 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 Chronic back pain   . CAD (coronary artery disease)   . Obesity   . Peripheral vascular disease hands and legs  . Anxiety   . Asthma   . GERD (gastroesophageal reflux disease)     takes Omeprazole daily  . Chronic constipation     takes MIralax and Colace daily  . Anemia     takes Folic Acid daily  . Hypothyroidism     takes Synthroid daily  . Depression     takes Cymbalta for "severe" depression  . Neuromuscular disorder     carpal tunnel in right hand  . Hypertension     takes Metoprolol and Lisinopril daily, sees Dr Bea Graff  . COPD (chronic obstructive pulmonary disease)   . Pneumonia     "today and several times before" (08/30/2012)  . Chronic bronchitis     "q yr w/season changes" (08/30/2012)  . Type II diabetes mellitus     Levemir 2units daily if > 150  . History of blood transfusion     "several" (08/30/2012)  . Hepatitis C   . Migraine     "last migraine was in 2013" (08/30/2012)  . Headache     "at least monthly" (08/30/2012)  . Arthritis    "left hand, back" (08/30/2012)  . End stage renal disease on dialysis 02/27/2011    Kidneys shut down at time of liver transplant in Sept 2011 at [facility] in [place], she has been on HD ever since.  Dialyzes at [facility] HD on TTS schedule.  Had L forearm graft used 10 months then removed Dec 2012 due to suspected infection.  A right upper arm AVG was placed Dec 2012 but she developed steal symptoms acutely and it was ligated the same day.  Never had an AV fistula due to small veins.  Now has L thigh AVG put in Jan 2013, has not clotted to date.    [name] CAD (coronary artery disease) Jan. 2015    Cath: 20% LAD, 50% D1; s/p LIMA-LAD  . Aortic stenosis   . Tobacco abuse   . S/P aortic valve replacement 03/15/13    Mechanical     Past Surgical History  Procedure Laterality Date  . Liver transplant  10/25/2009    sees Dr Ferol Luz 1 every 6 months, saw last in Dec 2013. [name] Coord [phone]  . Small intestine surgery  90's  . Thrombectomy    . Arteriovenous graft placement Left 10/03/10     forearm  .  Avgg removal  12/23/2010    Procedure: REMOVAL OF ARTERIOVENOUS GORETEX GRAFT ([place]);  Surgeon: Elam Dutch, MD;  Location: [place];  Service: Vascular;  Laterality: Left;  procedure started @1736 -9147  . Insertion of dialysis catheter  12/23/2010    Procedure: INSERTION OF DIALYSIS CATHETER;  Surgeon: Elam Dutch, MD;  Location: [place];  Service: Vascular;  Laterality: Right;  Right Internal Jugular 28cm dialysis catheter insertion procedure time 1701-1720   . Cholecystectomy  1993  . Cystoscopy  1990's  . Spinal growth rods  2010    "put 2 metal rods in my back; they had detetriorated" (08/30/2012)  . Av fistula placement  01/29/2011    Procedure: INSERTION OF ARTERIOVENOUS (AV) GORE-TEX GRAFT ARM;  Surgeon: Elam Dutch, MD;  Location: [place];  Service: Vascular;  Laterality: Right;  . Av fistula placement  03/10/2011    Procedure: INSERTION OF ARTERIOVENOUS (AV) GORE-TEX GRAFT  THIGH;  Surgeon: Elam Dutch, MD;  Location: [place];  Service: Vascular;  Laterality: Left;  . Tubal ligation  1990's  . Cardiac catheterization  2014  . Aortic valve replacement N/A 03/15/2013    AVR; Surgeon: Ivin Poot, MD;  Location: [facility] OR; Open Heart Surgery;  71mm[employer] mechanical prosthesis, top hat valve  . Coronary artery bypass graft N/A 03/15/2013    Procedure: CORONARY ARTERY BYPASS GRAFTING (CABG) times one using left internal mammary artery.;  Surgeon: Ivin Poot, MD;  Location: [place];  Service: Open Heart Surgery;  Laterality: N/A;  POSS CABG X 1  . Intraoperative transesophageal echocardiogram N/A 03/15/2013    Procedure: INTRAOPERATIVE TRANSESOPHAGEAL ECHOCARDIOGRAM;  Surgeon: Ivin Poot, MD;  Location: [place];  Service: Open Heart Surgery;  Laterality: N/A;  . Left heart catheterization with coronary/graft angiogram N/A 12/24/2011    Procedure: LEFT HEART CATHETERIZATION WITH Beatrix Fetters;  Surgeon: [name], MD;  Location: [facility] CATH LAB;  Service: Cardiovascular;  Laterality: N/A;  . Left heart catheterization with coronary angiogram N/A 07/29/2012    Procedure: LEFT HEART CATHETERIZATION WITH CORONARY ANGIOGRAM;  Surgeon: Troy Sine, MD;  Location: [facility] CATH LAB;  Service: Cardiovascular;  Laterality: N/A;  . Left heart catheterization with coronary angiogram N/A 03/10/2013    Procedure: LEFT HEART CATHETERIZATION WITH CORONARY ANGIOGRAM;  Surgeon: Troy Sine, MD;  Location: [facility] CATH LAB;  Service: Cardiovascular;  Laterality: N/A;  . Left heart catheterization with coronary/graft angiogram N/A 12/16/2013    Procedure: LEFT HEART CATHETERIZATION WITH Beatrix Fetters;  Surgeon: Troy Sine, MD;  Location: [facility] CATH LAB;  Service: Cardiovascular;  Laterality: N/A;   History   Social History  . Marital Status: Divorced    Spouse Name: N/A  . Number of Children: N/A  . Years of Education: N/A   Occupational History  . Not on  file.   Social History Main Topics  . Smoking status: Current Some Day Smoker -- 0.75 packs/day for 40 years    Types: Cigarettes    Last Attempt to Quit: 03/13/2013  . Smokeless tobacco: Never Used     Comment: still smokng 1 or 2 a day  . Alcohol Use: Yes     Comment: 08/30/2012 "last drink was at a wedding July, 2014; had a small glass of wine; never had problems w/alcohol"  . Drug Use: No  . Sexual Activity: Not Currently   Other Topics Concern  . Not on file   Social History Narrative   Family History  Problem Relation  Age of Onset  . Cancer Mother   . Diabetes Mother   . Hypertension Mother   . Stroke Mother   . Cancer Father   . Anesthesia problems Neg Hx   . Hypotension Neg Hx   . Malignant hyperthermia Neg Hx   . Pseudochol deficiency Neg Hx    No current facility-administered medications on file prior to encounter.   Current Outpatient Prescriptions on File Prior to Encounter  Medication Sig Dispense Refill  . albuterol (PROVENTIL,VENTOLIN) 90 MCG/ACT inhaler Inhale 1-2 puffs into the lungs every 6 (six) hours as needed for wheezing or shortness of breath.     [name] artificial tears (LACRILUBE) OINT ophthalmic ointment Place 1 application into the right eye every 4 (four) hours. Apply under right eye lid after administering eye drops    . aspirin EC 81 MG tablet Take 81 mg by mouth daily.      . calcium acetate (PHOSLO) 667 MG capsule Take 667-2,001 mg by mouth See admin instructions. Take 3 capsules (2001 mg) with meals and 1 capsule (667 mg) with snacks    . cinacalcet (SENSIPAR) 30 MG tablet Take 30 mg by mouth daily.    . dorzolamide-timolol (COSOPT) 22.3-6.8 MG/ML ophthalmic solution Place 1 drop into the right eye every 4 (four) hours. Follow with artificial tears ointment under eye lid    . gabapentin (NEURONTIN) 300 MG capsule Take 300 mg by mouth at bedtime.     . insulin aspart (NOVOLOG) 100 UNIT/ML injection Inject 1-3 Units into the skin 3 (three) times daily  as needed for high blood sugar (CBG>150). Per sliding scale    . levothyroxine (SYNTHROID, LEVOTHROID) 150 MCG tablet Take 150 mcg by mouth daily before breakfast.    . magnesium oxide (MAG-OX) 400 MG tablet Take 400 mg by mouth daily.    . metoCLOPramide (REGLAN) 10 MG tablet Take 10 mg by mouth 3 (three) times daily before meals.    . metoprolol tartrate (LOPRESSOR) 25 MG tablet Take 25 mg by mouth 2 (two) times daily.     . ondansetron (ZOFRAN-ODT) 4 MG disintegrating tablet Take 4 mg by mouth every 8 (eight) hours as needed for nausea or vomiting.    . tacrolimus (PROGRAF) 1 MG capsule Take 3 mg by mouth 2 (two) times daily.     . traMADol (ULTRAM) 50 MG tablet Take 1 tablet (50 mg total) by mouth every 12 (twelve) hours as needed for moderate pain. 20 tablet 0  . warfarin (COUMADIN) 4 MG tablet Take 1 tablet (4 mg total) by mouth daily. (Patient taking differently: Take 4 mg by mouth daily at 6 PM. ) 30 tablet 3  . nitroGLYCERIN (NITROSTAT) 0.4 MG SL tablet Place 1 tablet (0.4 mg total) under the tongue every 5 (five) minutes as needed for chest pain. 30 tablet 0   Allergies  Allergen Reactions  . Acetaminophen Other (See Comments)    Liver transplant recipient   . Codeine Itching  . Mirtazapine Other (See Comments)    hallucination  . Morphine Itching    REVIEW OF SYSTEMS:  (Positives checked otherwise negative)  CARDIOVASCULAR:  []  chest pain, []  chest pressure, [x]  palpitations, []  shortness of breath when laying flat, []  shortness of breath with exertion,  []  pain in feet when walking, []  pain in feet when laying flat, []  history of blood clot in veins (DVT), []  history of phlebitis, []  swelling in legs, []  varicose veins  PULMONARY:  []  productive cough, []  asthma, []   wheezing  NEUROLOGIC:  []  weakness in arms or legs, []  numbness in arms or legs, []  difficulty speaking or slurred speech, []  temporary loss of vision in one eye, []  dizziness  HEMATOLOGIC:  []  bleeding problems,  []  problems with blood clotting too easily  MUSCULOSKEL:  []  joint pain, []  joint swelling  GASTROINTEST:  []  vomiting blood, []  blood in stool     GENITOURINARY:  []  burning with urination, []  blood in urine PSYCHIATRIC:  []  history of major depression  INTEGUMENTARY:  []  rashes, []  ulcers  CONSTITUTIONAL:  []  fever, []  chills  For VQI Use Only  PRE-ADM LIVING: Home  AMB STATUS: Ambulatory  CAD Sx: None  PRIOR CHF: None  STRESS TEST: [x]  No, [ ]  Normal, [ ]  + ischemia, [ ]  + MI, [ ]  Both   Physical Examination  Filed Vitals:   03/30/14 1520  BP: 153/57  Temp: 97.9 F (36.6 C)  TempSrc: Oral  Resp: 13  Height: 5\' 4"  (1.626 m)  Weight: 165 lb (74.844 kg)  SpO2: 98%    Body mass index is 28.31 kg/(m^2).  General: A&O x 3, WDWN female in NAD  Head: [place]/AT  Neck: Supple  Pulmonary: Sym exp, good air movt  Cardiac: S1 S2 nml  Vascular: Left thigh AVG with active bleeding to cannulation site at lateral arterial limb.   Musculoskeletal: Moving all extremities equally   Neurologic: CN 2-12 grossly intact. Light touch and pain sensation intact.   Psychiatric: Judgment intact, Mood & affect appropriate for pt's clinical situation  Dermatologic: See M/S exam for extremity exam, no rashes otherwise noted  Lymph : No Cervical, Axillary, or Inguinal lymphadenopathy   Medical Decision Making  LEITHA HYPPOLITE is a 59 y.o. female who presents with: bleeding left thigh AV graft, mech valve on full anticoagulation  Bleeding was controlled in ED via two nylon sutures by Dr. Bridgett Larsson. Her last dose of coumadin was last evening. Continue coumadin reversal with FFP. We will take patient urgently to the OR for thrombectomy and revision of left thigh AV graft. Obtain consent. The patient has been NPO since last night.   Virgina Jock, PA-C [facility] [place] Office: [phone] Pager: [phone]  03/30/2014, 4:34 PM  Addendum  I have  independently interviewed and examined the patient, and I agree with the physician assistant's findings.  L thigh AVG feels degenerated, so I suspect this patient has eroded a segment of her graft, and will need revision.  I placed 2 sutures around the proximal arterial limb to get hemostasis.  Will reverse Coumadin and take the patient back to OR.  Will need: T&R L thigh AVG by the time we get her on the table.  Post-op will need admission for medical optimization and reload of Coumadin.   Adele Barthel, MD [facility] [place] Office: [phone] Pager: [phone]  03/30/2014, 5:06 PM

## 2014-03-31 DIAGNOSIS — N186 End stage renal disease: Secondary | ICD-10-CM

## 2014-03-31 DIAGNOSIS — D62 Acute posthemorrhagic anemia: Secondary | ICD-10-CM

## 2014-03-31 DIAGNOSIS — Z992 Dependence on renal dialysis: Secondary | ICD-10-CM

## 2014-03-31 DIAGNOSIS — R58 Hemorrhage, not elsewhere classified: Secondary | ICD-10-CM | POA: Diagnosis present

## 2014-03-31 LAB — PREPARE FRESH FROZEN PLASMA
UNIT DIVISION: 0
UNIT DIVISION: 0
Unit division: 0
Unit division: 0

## 2014-03-31 LAB — CBC WITH DIFFERENTIAL/PLATELET
BASOS ABS: 0 10*3/uL (ref 0.0–0.1)
Basophils Relative: 0 % (ref 0–1)
EOS ABS: 0.2 10*3/uL (ref 0.0–0.7)
Eosinophils Relative: 3 % (ref 0–5)
HCT: 20.1 % — ABNORMAL LOW (ref 36.0–46.0)
Hemoglobin: 6.5 g/dL — CL (ref 12.0–15.0)
LYMPHS PCT: 23 % (ref 12–46)
Lymphs Abs: 1 10*3/uL (ref 0.7–4.0)
MCH: 32 pg (ref 26.0–34.0)
MCHC: 32.3 g/dL (ref 30.0–36.0)
MCV: 99 fL (ref 78.0–100.0)
Monocytes Absolute: 0.4 10*3/uL (ref 0.1–1.0)
Monocytes Relative: 10 % (ref 3–12)
NEUTROS PCT: 64 % (ref 43–77)
Neutro Abs: 2.8 10*3/uL (ref 1.7–7.7)
PLATELETS: 83 10*3/uL — AB (ref 150–400)
RBC: 2.03 MIL/uL — ABNORMAL LOW (ref 3.87–5.11)
RDW: 15.5 % (ref 11.5–15.5)
WBC: 4.4 10*3/uL (ref 4.0–10.5)

## 2014-03-31 LAB — COMPREHENSIVE METABOLIC PANEL
ALBUMIN: 2.8 g/dL — AB (ref 3.5–5.2)
ALT: 24 U/L (ref 0–35)
AST: 39 U/L — AB (ref 0–37)
Alkaline Phosphatase: 71 U/L (ref 39–117)
Anion gap: 7 (ref 5–15)
BUN: 16 mg/dL (ref 6–23)
CALCIUM: 7.8 mg/dL — AB (ref 8.4–10.5)
CO2: 34 mmol/L — ABNORMAL HIGH (ref 19–32)
Chloride: 97 mmol/L (ref 96–112)
Creatinine, Ser: 4.81 mg/dL — ABNORMAL HIGH (ref 0.50–1.10)
GFR calc Af Amer: 10 mL/min — ABNORMAL LOW (ref >90)
GFR, EST NON AFRICAN AMERICAN: 9 mL/min — AB (ref >90)
Glucose, Bld: 177 mg/dL — ABNORMAL HIGH (ref 70–99)
Potassium: 4.2 mmol/L (ref 3.5–5.1)
Sodium: 138 mmol/L (ref 135–145)
Total Bilirubin: 0.7 mg/dL (ref 0.3–1.2)
Total Protein: 5.9 g/dL — ABNORMAL LOW (ref 6.0–8.3)

## 2014-03-31 LAB — BASIC METABOLIC PANEL
ANION GAP: 8 (ref 5–15)
BUN: 14 mg/dL (ref 6–23)
CHLORIDE: 98 mmol/L (ref 96–112)
CO2: 34 mmol/L — ABNORMAL HIGH (ref 19–32)
CREATININE: 4.64 mg/dL — AB (ref 0.50–1.10)
Calcium: 7.8 mg/dL — ABNORMAL LOW (ref 8.4–10.5)
GFR calc non Af Amer: 9 mL/min — ABNORMAL LOW (ref >90)
GFR, EST AFRICAN AMERICAN: 11 mL/min — AB (ref >90)
Glucose, Bld: 181 mg/dL — ABNORMAL HIGH (ref 70–99)
Potassium: 4.2 mmol/L (ref 3.5–5.1)
Sodium: 140 mmol/L (ref 135–145)

## 2014-03-31 LAB — HEMOGLOBIN AND HEMATOCRIT, BLOOD
HEMATOCRIT: 26.5 % — AB (ref 36.0–46.0)
HEMOGLOBIN: 8.7 g/dL — AB (ref 12.0–15.0)

## 2014-03-31 LAB — CBC
HCT: 20.9 % — ABNORMAL LOW (ref 36.0–46.0)
Hemoglobin: 6.7 g/dL — CL (ref 12.0–15.0)
MCH: 32.5 pg (ref 26.0–34.0)
MCHC: 32.1 g/dL (ref 30.0–36.0)
MCV: 101.5 fL — AB (ref 78.0–100.0)
PLATELETS: 72 10*3/uL — AB (ref 150–400)
RBC: 2.06 MIL/uL — ABNORMAL LOW (ref 3.87–5.11)
RDW: 15.5 % (ref 11.5–15.5)
WBC: 4 10*3/uL (ref 4.0–10.5)

## 2014-03-31 LAB — PROTIME-INR
INR: 1.13 (ref 0.00–1.49)
Prothrombin Time: 14.7 seconds (ref 11.6–15.2)

## 2014-03-31 LAB — HEPARIN LEVEL (UNFRACTIONATED): HEPARIN UNFRACTIONATED: 0.19 [IU]/mL — AB (ref 0.30–0.70)

## 2014-03-31 LAB — GLUCOSE, CAPILLARY
GLUCOSE-CAPILLARY: 158 mg/dL — AB (ref 70–99)
Glucose-Capillary: 160 mg/dL — ABNORMAL HIGH (ref 70–99)
Glucose-Capillary: 154 mg/dL — ABNORMAL HIGH (ref 70–99)
Glucose-Capillary: 217 mg/dL — ABNORMAL HIGH (ref 70–99)
Glucose-Capillary: 130 mg/dL — ABNORMAL HIGH (ref 70–99)

## 2014-03-31 LAB — PREPARE RBC (CROSSMATCH)

## 2014-03-31 MED ORDER — SODIUM CHLORIDE 0.9 % IV SOLN
Freq: Once | INTRAVENOUS | Status: AC
  Administered 2014-03-31: 03:00:00 via INTRAVENOUS

## 2014-03-31 MED ORDER — DARBEPOETIN ALFA 60 MCG/0.3ML IJ SOSY
60.0000 ug | PREFILLED_SYRINGE | INTRAMUSCULAR | Status: DC
  Filled 2014-03-31: qty 0.3

## 2014-03-31 MED ORDER — WARFARIN - PHARMACIST DOSING INPATIENT
Freq: Every day | Status: DC
  Administered 2014-04-03: 17:00:00

## 2014-03-31 MED ORDER — CALCITRIOL 0.5 MCG PO CAPS
0.5000 ug | ORAL_CAPSULE | ORAL | Status: DC
  Administered 2014-04-01 – 2014-04-04 (×2): 0.5 ug via ORAL
  Filled 2014-03-31 (×2): qty 1

## 2014-03-31 MED ORDER — HEPARIN (PORCINE) IN NACL 100-0.45 UNIT/ML-% IJ SOLN
1100.0000 [IU]/h | INTRAMUSCULAR | Status: DC
  Administered 2014-03-31 – 2014-04-04 (×5): 1100 [IU]/h via INTRAVENOUS
  Filled 2014-03-31 (×9): qty 250

## 2014-03-31 MED ORDER — RENA-VITE PO TABS
1.0000 | ORAL_TABLET | Freq: Every day | ORAL | Status: DC
  Administered 2014-03-31 – 2014-04-03 (×4): 1 via ORAL
  Filled 2014-03-31 (×5): qty 1

## 2014-03-31 MED ORDER — WARFARIN SODIUM 4 MG PO TABS
4.0000 mg | ORAL_TABLET | Freq: Once | ORAL | Status: AC
  Administered 2014-03-31: 4 mg via ORAL
  Filled 2014-03-31: qty 1

## 2014-03-31 NOTE — Progress Notes (Signed)
Utilization review completed. Ewan Grau, RN, BSN. 

## 2014-03-31 NOTE — Progress Notes (Addendum)
   Daily Progress Note  Assessment/Planning: POD #1 s/p L thigh AVG revision for rupture, Mech AVR, COPD, DM, Liver transplant, ESRD-HD   ESRD-HD.  L thigh AVG can continue to be used for HD: AVOID sticking ARTERIAL LIMB (LATERAL)  Mechanical AVR: Heparin gtt and coumadin started: unfortunately mech AVR will force anticoagulation, transfuse pRBC during HD and PLT as needed  Will check on patient periodically  Subjective  - 1 Day Post-Op  Pain ok, no bleeding overnight  Objective Filed Vitals:   03/31/14 0340 03/31/14 0401 03/31/14 0600 03/31/14 0700  BP: 147/41 143/42 158/42   Pulse: 61 64 64   Temp: 98.4 F (36.9 C) 98.5 F (36.9 C) 98.5 F (36.9 C)   TempSrc: Oral Oral Oral   Resp: 13 16 14    Height:      Weight:    171 lb 15.3 oz (78 kg)  SpO2: 94% 100% 98%     Intake/Output Summary (Last 24 hours) at 03/31/14 0834 Last data filed at 03/31/14 0600  Gross per 24 hour  Intake   1800 ml  Output      0 ml  Net   1800 ml    VASC  L thigh: Inc x 2 c/d/i. No bleeding from prior perforation, +thrill and +bruit  Laboratory CBC    Component Value Date/Time   WBC 4.4 03/31/2014 0154   HGB 8.7* 03/31/2014 0725   HCT 26.5* 03/31/2014 0725   PLT 83* 03/31/2014 0154    BMET    Component Value Date/Time   NA 138 03/31/2014 0154   K 4.2 03/31/2014 0154   CL 97 03/31/2014 0154   CO2 34* 03/31/2014 0154   GLUCOSE 177* 03/31/2014 0154   BUN 16 03/31/2014 0154   CREATININE 4.81* 03/31/2014 0154   CALCIUM 7.8* 03/31/2014 0154   GFRNONAA 9* 03/31/2014 0154   GFRAA 10* 03/31/2014 0154    Adele Barthel, MD [facility] [place] Office: [phone] Pager: [phone]  03/31/2014, 8:34 AM

## 2014-03-31 NOTE — Progress Notes (Signed)
PROGRESS NOTE  [name]:[id number] DOB: 29-Nov-1955 DOA: 03/30/2014 PCP: No primary care provider on file.  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 The patient finished a successful session of dialysis in [place]. There was difficulty with continued bleeding at her dialysis access site. An attempt was made to stop the bleeding by placing a stitch at [facility]. She continued to bleed. She was transferred to [facility], for further evaluation. The patient was seen by vascular surgery, Dr. Bridgett Larsson.  She was taken to the OR for repair/revision of her left thigh AV graft on the evening of 03/30/2014. Assessment/Plan: Acute blood loss anemia -Baseline hemoglobin around 10 -Presented hemoglobin 6.7 -Patient transfused 1 unit PRBC -Continue to monitor inpatient given the patient's comorbidities including continued necessity of warfarin AV graft bleeding/degeneration -Appreciate Dr. Bridgett Larsson follow-up -We will keep the patient inpatient for HD trial using her repaired graft before she is discharged -03/30/2014 Status post left thigh AV graft repair ESRD -Nephrology has been notified of the patient's maintenance dialysis -Patient dialyzes Tuesday, Thursday, Saturday in [place] Aortic stenosis status post aortic valve replacement (mechanical valve) -Patient received 3 units of FFP prior to surgical intervention yesterday -Restart Coumadin with heparin bridge -Monitor closely for re-bleed Chronic systolic and diastolic CHF -95/62/1308 echo shows EF 55% -Appears euvolemic presently Hypertension -Continue metoprolol tartrate Status  post liver transplant  -Continue tacrolimus  hypothyroidism -Continue Synthroid DM type 2 -SSI -A1C CAD -continue ASA, BB  Family Communication:   Pt at beside Disposition Plan:   Home when medically stable       Procedures/Studies: Dg Chest Portable 1 View  03/07/2014   CLINICAL DATA:  Acute chest pain since yesterday.  EXAM: PORTABLE CHEST - 1 VIEW  COMPARISON:  February 19, 2014.  FINDINGS: Stable cardiomediastinal silhouette. Sternotomy wires are noted. No pneumothorax or significant pleural effusion is noted. No acute pulmonary disease is noted. Bony thorax appears intact.  IMPRESSION: No acute cardiopulmonary abnormality seen.   Electronically Signed   By: Sabino Dick M.D.   On: 03/07/2014 08:15        Subjective:  patient complains of some pain in her left thigh surgical site. Denies any fevers, chills, chest pain, shortness breath, nausea, vomiting, diarrhea, abdominal pain.  Objective: Filed Vitals:   03/31/14 0340 03/31/14 0401 03/31/14 0600 03/31/14 0700  BP: 147/41 143/42 158/42   Pulse: 61 64 64   Temp: 98.4 F (36.9 C) 98.5 F (36.9 C) 98.5 F (36.9 C)   TempSrc: Oral Oral Oral   Resp: 13 16 14    Height:      Weight:    78 kg (171 lb 15.3 oz)  SpO2: 94% 100% 98%     Intake/Output Summary (Last 24 hours) at 03/31/14 1146 Last data filed at 03/31/14 0830  Gross per 24 hour  Intake   1920 ml  Output      0 ml  Net   1920 ml   Weight change:  Exam:   General:  Pt is alert, follows commands appropriately, not in acute distress  HEENT: No icterus, No thrush,[place]/AT  Cardiovascular: RRR, S1/S2, no rubs, no gallops  Respiratory: CTA  bilaterally, no wheezing, no crackles, no rhonchi  Abdomen: Soft/+BS, non tender, non distended, no guarding  Extremities: No edema, No lymphangitis, No petechiae, No rashes, no synovitis; left thigh surgical site without any erythema or drainage.   Data Reviewed: Basic Metabolic Panel:  Recent Labs Lab  03/30/14 1649 03/31/14 0036 03/31/14 0154  NA 137 140 138  K 4.3 4.2 4.2  CL 96 98 97  CO2  --  34* 34*  GLUCOSE 146* 181* 177*  BUN 11 14 16   CREATININE 3.90* 4.64* 4.81*  CALCIUM  --  7.8* 7.8*   Liver Function Tests:  Recent Labs Lab 03/31/14 0154  AST 39*  ALT 24  ALKPHOS 71  BILITOT 0.7  PROT 5.9*  ALBUMIN 2.8*   No results for input(s): LIPASE, AMYLASE in the last 168 hours. No results for input(s): AMMONIA in the last 168 hours. CBC:  Recent Labs Lab 03/30/14 1636 03/30/14 1649 03/31/14 0036 03/31/14 0154 03/31/14 0725  WBC 5.1  --  4.0 4.4  --   NEUTROABS 3.1  --   --  2.8  --   HGB 9.0* 10.2* 6.7* 6.5* 8.7*  HCT 28.1* 30.0* 20.9* 20.1* 26.5*  MCV 99.3  --  101.5* 99.0  --   PLT 91*  --  72* 83*  --    Cardiac Enzymes: No results for input(s): CKTOTAL, CKMB, CKMBINDEX, TROPONINI in the last 168 hours. BNP: Invalid input(s): POCBNP CBG:  Recent Labs Lab 03/30/14 1805 03/30/14 2016 03/31/14 0054 03/31/14 0159  GLUCAP 127* 111* 160* 158*    No results found for this or any previous visit (from the past 240 hour(s)).   Scheduled Meds: . sodium chloride   Intravenous Once  . artificial tears  1 application Right Eye I9S  . aspirin EC  81 mg Oral Daily  . [START ON 04/01/2014] calcitRIOL  0.5 mcg Oral Q T,Th,Sa-HD  . calcium acetate  2,001 mg Oral TID WC  . cinacalcet  30 mg Oral QPC supper  . [START ON 04/01/2014] darbepoetin (ARANESP) injection - DIALYSIS  60 mcg Intravenous Q Sat-HD  . docusate sodium  100 mg Oral Daily  . dorzolamide-timolol  1 drop Right Eye Q4H  . gabapentin  300 mg Oral QHS  . insulin aspart  0-9 Units Subcutaneous 6 times per day  . levothyroxine  150 mcg Oral QAC breakfast  . metoCLOPramide  10 mg Oral TID AC  . metoprolol tartrate  25 mg Oral BID  . multivitamin  1 tablet Oral QHS  . pantoprazole  40 mg Oral QAC breakfast  . sodium chloride  3 mL Intravenous Q12H  . tacrolimus  3 mg Oral BID   Continuous  Infusions: . heparin 900 Units/hr (03/31/14 0642)     Nakari Bracknell, DO  [facility] Pager [phone]  If 7PM-7AM, please contact night-coverage [email or web address] Password [email or web address] 03/31/2014, 11:46 AM   LOS: 1 day

## 2014-03-31 NOTE — Progress Notes (Signed)
Blood transfusion ended at 600AM.  335 mL transfused.  Pt had no reaction, VSS, pt resting comfortably.  Will continue to monitor.  Claudette Stapler, RN

## 2014-03-31 NOTE — Progress Notes (Signed)
ANTICOAGULATION CONSULT NOTE - [facility] for Heparin and Warfarin Indication: h/o mechanical AVR with warfarin reversed   Allergies  Allergen Reactions  . Acetaminophen Other (See Comments)    Liver transplant recipient   . Codeine Itching  . Mirtazapine Other (See Comments)    hallucination  . Morphine Itching    Patient Measurements: Height: 5\' 4"  (162.6 cm) Weight: 171 lb 15.3 oz (78 kg) IBW/kg (Calculated) : 54.7 Heparin Dosing Weight: 70 kg  Vital Signs: Temp: 98.5 F (36.9 C) (02/19 1421) Temp Source: Oral (02/19 1421) BP: 137/48 mmHg (02/19 1421) Pulse Rate: 69 (02/19 1421)  Labs:  Recent Labs  03/30/14 1636 03/30/14 1649 03/31/14 0036 03/31/14 0154 03/31/14 0725 03/31/14 1359  HGB 9.0* 10.2* 6.7* 6.5* 8.7*  --   HCT 28.1* 30.0* 20.9* 20.1* 26.5*  --   PLT 91*  --  72* 83*  --   --   LABPROT 29.5*  --   --   --   --  14.7  INR 2.77*  --   --   --   --  1.13  HEPARINUNFRC  --   --   --   --   --  0.19*  CREATININE  --  3.90* 4.64* 4.81*  --   --     Estimated Creatinine Clearance: 12.7 mL/min (by C-G formula based on Cr of 4.81).   Medical History: Past Medical History  Diagnosis Date  . 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 Chronic back pain   . CAD (coronary artery disease)   . Obesity   . Peripheral vascular disease hands and legs  . Anxiety   . Asthma   . GERD (gastroesophageal reflux disease)     takes Omeprazole daily  . Chronic constipation     takes MIralax and Colace daily  . Anemia     takes Folic Acid daily  . Hypothyroidism     takes Synthroid daily  . Depression     takes Cymbalta for "severe" depression  . Neuromuscular disorder     carpal tunnel in right hand  . Hypertension     takes Metoprolol and Lisinopril daily, sees Dr Bea Graff  . COPD (chronic obstructive pulmonary disease)   . Pneumonia     "today and several  times before" (08/30/2012)  . Chronic bronchitis     "q yr w/season changes" (08/30/2012)  . Type II diabetes mellitus     Levemir 2units daily if > 150  . History of blood transfusion     "several" (08/30/2012)  . Hepatitis C   . Migraine     "last migraine was in 2013" (08/30/2012)  . Headache     "at least monthly" (08/30/2012)  . Arthritis     "left hand, back" (08/30/2012)  . End stage renal disease on dialysis 02/27/2011    Kidneys shut down at time of liver transplant in Sept 2011 at [facility] in [place], she has been on HD ever since.  Dialyzes at [facility] HD on TTS schedule.  Had L forearm graft used 10 months then removed Dec 2012 due to suspected infection.  A right upper arm AVG was placed Dec 2012 but she developed steal symptoms acutely and it was ligated the same day.  Never had an AV fistula due to small veins.  Now has L thigh AVG put in  Jan 2013, has not clotted to date.    [name] CAD (coronary artery disease) Jan. 2015    Cath: 20% LAD, 50% D1; s/p LIMA-LAD  . Aortic stenosis   . Tobacco abuse   . S/P aortic valve replacement 03/15/13    Mechanical     Medications:  Prescriptions prior to admission  Medication Sig Dispense Refill Last Dose  . albuterol (PROVENTIL,VENTOLIN) 90 MCG/ACT inhaler Inhale 1-2 puffs into the lungs every 6 (six) hours as needed for wheezing or shortness of breath.    Past Week at Unknown time  . artificial tears (LACRILUBE) OINT ophthalmic ointment Place 1 application into the right eye every 4 (four) hours. Apply under right eye lid after administering eye drops   Past Week at Unknown time  . aspirin EC 81 MG tablet Take 81 mg by mouth daily.     03/29/2014 at Unknown time  . calcium acetate (PHOSLO) 667 MG capsule Take 667-2,001 mg by mouth See admin instructions. Take 3 capsules (2001 mg) with meals and 1 capsule (667 mg) with snacks   03/29/2014 at Unknown time  . cinacalcet (SENSIPAR) 30 MG tablet Take 30 mg by mouth daily.   03/29/2014 at Unknown time  .  dorzolamide-timolol (COSOPT) 22.3-6.8 MG/ML ophthalmic solution Place 1 drop into the right eye every 4 (four) hours. Follow with artificial tears ointment under eye lid   03/29/2014 at Unknown time  . gabapentin (NEURONTIN) 300 MG capsule Take 300 mg by mouth at bedtime.    03/29/2014 at Unknown time  . insulin aspart (NOVOLOG) 100 UNIT/ML injection Inject 1-3 Units into the skin 3 (three) times daily as needed for high blood sugar (CBG>150). Per sliding scale   03/29/2014 at Unknown time  . levothyroxine (SYNTHROID, LEVOTHROID) 150 MCG tablet Take 150 mcg by mouth daily before breakfast.   03/29/2014 at Unknown time  . magnesium oxide (MAG-OX) 400 MG tablet Take 400 mg by mouth daily.   03/29/2014 at Unknown time  . metoCLOPramide (REGLAN) 10 MG tablet Take 10 mg by mouth 3 (three) times daily before meals.   03/29/2014 at Unknown time  . metoprolol tartrate (LOPRESSOR) 25 MG tablet Take 25 mg by mouth 2 (two) times daily.    03/29/2014 at 7p  . ondansetron (ZOFRAN-ODT) 4 MG disintegrating tablet Take 4 mg by mouth every 8 (eight) hours as needed for nausea or vomiting.   03/29/2014 at Unknown time  . tacrolimus (PROGRAF) 1 MG capsule Take 3 mg by mouth 2 (two) times daily.    03/29/2014 at Unknown time  . traMADol (ULTRAM) 50 MG tablet Take 1 tablet (50 mg total) by mouth every 12 (twelve) hours as needed for moderate pain. 20 tablet 0 03/29/2014 at Unknown time  . warfarin (COUMADIN) 4 MG tablet Take 1 tablet (4 mg total) by mouth daily. (Patient taking differently: Take 4 mg by mouth daily at 6 PM. ) 30 tablet 3 03/29/2014 at 6p  . nitroGLYCERIN (NITROSTAT) 0.4 MG SL tablet Place 1 tablet (0.4 mg total) under the tongue every 5 (five) minutes as needed for chest pain. 30 tablet 0 unknown   Scheduled:  . sodium chloride   Intravenous Once  . artificial tears  1 application Right Eye U2P  . aspirin EC  81 mg Oral Daily  . [START ON 04/01/2014] calcitRIOL  0.5 mcg Oral Q T,Th,Sa-HD  . calcium acetate  2,001  mg Oral TID WC  . cinacalcet  30 mg Oral QPC supper  . [START  ON 04/01/2014] darbepoetin (ARANESP) injection - DIALYSIS  60 mcg Intravenous Q Sat-HD  . docusate sodium  100 mg Oral Daily  . dorzolamide-timolol  1 drop Right Eye Q4H  . gabapentin  300 mg Oral QHS  . insulin aspart  0-9 Units Subcutaneous 6 times per day  . levothyroxine  150 mcg Oral QAC breakfast  . metoCLOPramide  10 mg Oral TID AC  . metoprolol tartrate  25 mg Oral BID  . multivitamin  1 tablet Oral QHS  . pantoprazole  40 mg Oral QAC breakfast  . sodium chloride  3 mL Intravenous Q12H  . tacrolimus  3 mg Oral BID  . warfarin  4 mg Oral ONCE-1800  . Warfarin - Pharmacist Dosing Inpatient   Does not apply q1800   Infusions:  . heparin      Assessment: 59yo female with history of ESRD on HD TTS and mechanical AVR presents with uncontrolled bleeding of left thigh AV graft since HD this AM. Pharmacy is consulted to dose heparin and warfarin for mechanical AVR. Pt received phytonadione 10mg  IV at 1552 and 3u of FFP. INR on admission 2.77, Hgb 10.2, Plt 91.  There was no further active bleeding at the end of the case per vascular surg note.  2/19> INR down to 1.13,  Heparin level low 0.19.  Platelet count low but stable.  PTA Warfarin dose: 4mg  daily .   Goal of Therapy:  INR 2-3 Heparin level 0.3-0.7 units/ml Monitor platelets by anticoagulation protocol: Yes    Plan:  Increase IV heparin to 1900 units/hr Recheck level 8 hrs  Coumadin 4 mg x 1 tonight Daily HL, CBC and INR. Continue to monitor H&H and platelets  Monitor s/sx of bleeding  Uvaldo Rising, BCPS  Clinical Pharmacist Pager [phone]  03/31/2014 3:12 PM

## 2014-03-31 NOTE — Progress Notes (Signed)
Paged on call vascular dr Bridgett Larsson) about patient's scheduled heparin gtt to begin at 6AM.  Wanted MD to be aware that patient's hemoglobin dropped to 6.5, platelets 83 and is currently receiving 1 unit PRBC before administering heparin.  Physician stated he would still like to begin heparin gtt at 40mL/hr for patient's mechanical AVR.  Will continue to monitor closely.  Claudette Stapler, RN

## 2014-04-01 ENCOUNTER — Encounter (HOSPITAL_COMMUNITY): Payer: Self-pay | Admitting: Vascular Surgery

## 2014-04-01 LAB — CBC WITH DIFFERENTIAL/PLATELET
BASOS ABS: 0 10*3/uL (ref 0.0–0.1)
BASOS PCT: 0 % (ref 0–1)
EOS ABS: 0.1 10*3/uL (ref 0.0–0.7)
Eosinophils Relative: 3 % (ref 0–5)
HCT: 24.3 % — ABNORMAL LOW (ref 36.0–46.0)
HEMOGLOBIN: 8 g/dL — AB (ref 12.0–15.0)
Lymphocytes Relative: 29 % (ref 12–46)
Lymphs Abs: 1.3 10*3/uL (ref 0.7–4.0)
MCH: 31.4 pg (ref 26.0–34.0)
MCHC: 32.9 g/dL (ref 30.0–36.0)
MCV: 95.3 fL (ref 78.0–100.0)
MONOS PCT: 9 % (ref 3–12)
Monocytes Absolute: 0.4 10*3/uL (ref 0.1–1.0)
Neutro Abs: 2.6 10*3/uL (ref 1.7–7.7)
Neutrophils Relative %: 59 % (ref 43–77)
Platelets: 66 10*3/uL — ABNORMAL LOW (ref 150–400)
RBC: 2.55 MIL/uL — AB (ref 3.87–5.11)
RDW: 17.5 % — ABNORMAL HIGH (ref 11.5–15.5)
WBC: 4.4 10*3/uL (ref 4.0–10.5)

## 2014-04-01 LAB — GLUCOSE, CAPILLARY
GLUCOSE-CAPILLARY: 103 mg/dL — AB (ref 70–99)
GLUCOSE-CAPILLARY: 171 mg/dL — AB (ref 70–99)
GLUCOSE-CAPILLARY: 154 mg/dL — AB (ref 70–99)
Glucose-Capillary: 151 mg/dL — ABNORMAL HIGH (ref 70–99)
Glucose-Capillary: 160 mg/dL — ABNORMAL HIGH (ref 70–99)

## 2014-04-01 LAB — PROTIME-INR
INR: 1.17 (ref 0.00–1.49)
Prothrombin Time: 15.1 seconds (ref 11.6–15.2)

## 2014-04-01 LAB — COMPREHENSIVE METABOLIC PANEL
ALK PHOS: 76 U/L (ref 39–117)
ALT: 9 U/L (ref 0–35)
ANION GAP: 8 (ref 5–15)
AST: 28 U/L (ref 0–37)
Albumin: 2.7 g/dL — ABNORMAL LOW (ref 3.5–5.2)
BILIRUBIN TOTAL: 0.7 mg/dL (ref 0.3–1.2)
BUN: 35 mg/dL — ABNORMAL HIGH (ref 6–23)
CHLORIDE: 98 mmol/L (ref 96–112)
CO2: 30 mmol/L (ref 19–32)
CREATININE: 7.46 mg/dL — AB (ref 0.50–1.10)
Calcium: 7.8 mg/dL — ABNORMAL LOW (ref 8.4–10.5)
GFR calc Af Amer: 6 mL/min — ABNORMAL LOW (ref >90)
GFR calc non Af Amer: 5 mL/min — ABNORMAL LOW (ref >90)
Glucose, Bld: 150 mg/dL — ABNORMAL HIGH (ref 70–99)
POTASSIUM: 5.3 mmol/L — AB (ref 3.5–5.1)
Sodium: 136 mmol/L (ref 135–145)
TOTAL PROTEIN: 5.8 g/dL — AB (ref 6.0–8.3)

## 2014-04-01 LAB — HEPARIN LEVEL (UNFRACTIONATED)
HEPARIN UNFRACTIONATED: 0.47 [IU]/mL (ref 0.30–0.70)
Heparin Unfractionated: 0.42 IU/mL (ref 0.30–0.70)

## 2014-04-01 LAB — HEMOGLOBIN A1C
Hgb A1c MFr Bld: 6.7 % — ABNORMAL HIGH (ref 4.8–5.6)
Mean Plasma Glucose: 146 mg/dL

## 2014-04-01 LAB — PREPARE RBC (CROSSMATCH)

## 2014-04-01 MED ORDER — CALCITRIOL 0.5 MCG PO CAPS: 0.5000 ug | ORAL_CAPSULE | ORAL | Status: DC

## 2014-04-01 MED ORDER — PENTAFLUOROPROP-TETRAFLUOROETH EX AERO: 1.0000 "application " | INHALATION_SPRAY | CUTANEOUS | Status: DC | PRN

## 2014-04-01 MED ORDER — SODIUM CHLORIDE 0.9 % IV SOLN: 100.0000 mL | INTRAVENOUS | Status: DC | PRN

## 2014-04-01 MED ORDER — WARFARIN SODIUM 4 MG PO TABS
4.0000 mg | ORAL_TABLET | Freq: Once | ORAL | Status: AC
  Administered 2014-04-01: 4 mg via ORAL
  Filled 2014-04-01: qty 1

## 2014-04-01 MED ORDER — LIDOCAINE HCL (PF) 1 % IJ SOLN: 5.0000 mL | INTRAMUSCULAR | Status: DC | PRN

## 2014-04-01 MED ORDER — DARBEPOETIN ALFA 60 MCG/0.3ML IJ SOSY
60.0000 ug | PREFILLED_SYRINGE | INTRAMUSCULAR | Status: DC
  Administered 2014-04-01: 60 ug via INTRAVENOUS

## 2014-04-01 MED ORDER — HEPARIN SODIUM (PORCINE) 1000 UNIT/ML DIALYSIS: 1000.0000 [IU] | INTRAMUSCULAR | Status: DC | PRN

## 2014-04-01 MED ORDER — NEPRO/CARBSTEADY PO LIQD
237.0000 mL | ORAL | Status: DC | PRN
  Filled 2014-04-01: qty 237

## 2014-04-01 MED ORDER — LIDOCAINE-PRILOCAINE 2.5-2.5 % EX CREA
1.0000 "application " | TOPICAL_CREAM | CUTANEOUS | Status: DC | PRN
  Filled 2014-04-01: qty 5

## 2014-04-01 MED ORDER — DARBEPOETIN ALFA 60 MCG/0.3ML IJ SOSY
PREFILLED_SYRINGE | INTRAMUSCULAR | Status: AC
  Filled 2014-04-01: qty 0.3

## 2014-04-01 MED ORDER — SODIUM CHLORIDE 0.9 % IV SOLN: Freq: Once | INTRAVENOUS | Status: DC

## 2014-04-01 MED ORDER — ALTEPLASE 2 MG IJ SOLR
2.0000 mg | Freq: Once | INTRAMUSCULAR | Status: DC | PRN
  Filled 2014-04-01: qty 2

## 2014-04-01 NOTE — Procedures (Signed)
I have personally attended this patient's dialysis session.   Time modification: 3 hours 45 minutes = usual outpt time No heparin Hb is 8 - will give 1 unit PRBC's today K is 5.3 2K bath Cannulating with 2 needles - using medial aspect of revised left thigh AVG BFR 400  Jamal Maes, MD [place] Pager 04/01/2014, 7:34 AM

## 2014-04-01 NOTE — Progress Notes (Signed)
ANTICOAGULATION CONSULT NOTE - Follow Up Consult  Pharmacy Consult for heparin Indication: mechanical AVR  Allergies  Allergen Reactions  . Acetaminophen Other (See Comments)    Liver transplant recipient   . Codeine Itching  . Mirtazapine Other (See Comments)    hallucination  . Morphine Itching    Patient Measurements: Height: 5\' 4"  (162.6 cm) Weight: 169 lb 15.6 oz (77.1 kg) IBW/kg (Calculated) : 54.7 Heparin Dosing Weight: 70 kg  Vital Signs: Temp: 98 F (36.7 C) (02/20 1309) Temp Source: Oral (02/20 1309) BP: 112/41 mmHg (02/20 1309) Pulse Rate: 70 (02/20 1309)  Labs:  Recent Labs  03/30/14 1636  03/31/14 0036 03/31/14 0154 03/31/14 0230 03/31/14 0725 03/31/14 1359 04/01/14 0230 04/01/14 1220  HGB 9.0*  < > 6.7* 6.5*  --  8.7*  --  8.0*  --   HCT 28.1*  < > 20.9* 20.1*  --  26.5*  --  24.3*  --   PLT 91*  --  72* 83*  --   --   --  66*  --   LABPROT 29.5*  --   --   --   --   --  14.7  --   --   INR 2.77*  --   --   --   --   --  1.13  --   --   HEPARINUNFRC  --   --   --   --  0.42  --  0.19*  --  0.47  CREATININE  --   < > 4.64* 4.81*  --   --   --  7.46*  --   < > = values in this interval not displayed.  Estimated Creatinine Clearance: 8.2 mL/min (by C-G formula based on Cr of 7.46).  Assessment: 59yo female with history of ESRD on HD TTS and mechanical AVR presents with uncontrolled bleeding of left thigh AV graft. Pt received phytonadione 10mg  IV at 1552 and 3u of FFP on 2/18. INR on admission 2.77. S/p L thigh AVG revision on 2/18 with heparin and coumadin resumed after procedure. Hgb 8, plt 66 with plan for 1 unit PRBC today. Heparin level is at goal with 0.47. INR is 1.17.  No active bleeding documented.   Goal of Therapy:  Heparin level 0.3-0.7 units/ml; INR goal 2.5-3 (per anti-coag clinic) Monitor platelets by anticoagulation protocol: Yes   Plan:  - continue heparin drip at 1100 units/hr - coumadin 4mg  PO x1 today  Robynne Roat  P 04/01/2014,1:17 PM

## 2014-04-01 NOTE — Progress Notes (Signed)
ANTICOAGULATION CONSULT NOTE - Follow Up Consult  Pharmacy Consult for Heparin  Indication: mechanical AVR  Allergies  Allergen Reactions  . Acetaminophen Other (See Comments)    Liver transplant recipient   . Codeine Itching  . Mirtazapine Other (See Comments)    hallucination  . Morphine Itching    Patient Measurements: Height: 5\' 4"  (162.6 cm) Weight: 171 lb 15.3 oz (78 kg) IBW/kg (Calculated) : 54.7  Vital Signs: Temp: 98.2 F (36.8 C) (02/20 0311) Temp Source: Oral (02/20 0311) BP: 111/39 mmHg (02/20 0311) Pulse Rate: 72 (02/20 0311)  Labs:  Recent Labs  03/30/14 1636  03/31/14 0036 03/31/14 0154 03/31/14 0230 03/31/14 0725 03/31/14 1359 04/01/14 0230  HGB 9.0*  < > 6.7* 6.5*  --  8.7*  --  8.0*  HCT 28.1*  < > 20.9* 20.1*  --  26.5*  --  24.3*  PLT 91*  --  72* 83*  --   --   --  66*  LABPROT 29.5*  --   --   --   --   --  14.7  --   INR 2.77*  --   --   --   --   --  1.13  --   HEPARINUNFRC  --   --   --   --  0.42  --  0.19*  --   CREATININE  --   < > 4.64* 4.81*  --   --   --  7.46*  < > = values in this interval not displayed.  Estimated Creatinine Clearance: 8.2 mL/min (by C-G formula based on Cr of 7.46).   Assessment: Heparin level 0.42 after rate increase (resulted on wrong day in EPIC), Hgb down post-op (monitor).   Goal of Therapy:  Heparin level 0.3-0.7 units/ml Monitor platelets by anticoagulation protocol: Yes   Plan:  -Continue heparin at 1100 units/hr -1200 HL -Daily CBC/HL -Monitor for bleeding  Narda Bonds 04/01/2014,3:59 AM

## 2014-04-01 NOTE — Progress Notes (Signed)
PROGRESS NOTE  [name] QKM:[id number] DOB: [birth year] DOA: 03/30/2014 PCP: No primary care provider on file.   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 The patient finished a successful session of dialysis in [place]. There was difficulty with continued bleeding at her dialysis access site. An attempt was made to stop the bleeding by placing a stitch at [facility]. She continued to bleed. She was transferred to [facility], for further evaluation. The patient was seen by vascular surgery, Dr. Bridgett Larsson. She was taken to the OR for repair/revision of her left thigh AV graft on the evening of 03/30/2014. Assessment/Plan: Acute blood loss anemia -Baseline hemoglobin around 10 -Presented hemoglobin 6.7 -Patient transfused 2 units PRBC for the admission -Continue to monitor inpatient given the patient's comorbidities including continued necessity of warfarin -hesitant to send pt home with enoxaparin  Bridge as the patient is ESRD which would increase risk of bleeding with enoxaparin  -Continue heparin drip as a bridge for warfarin  AV graft bleeding/degeneration -Appreciate Dr. Bridgett Larsson follow-up -We will keep the patient inpatient for HD trial using her repaired graft before she is discharged -03/30/2014 Status post left thigh AV graft repair ESRD -Nephrology has been notified of the patient's maintenance dialysis -Patient dialyzes Tuesday, Thursday, Saturday in [place] Aortic stenosis status post aortic valve replacement (mechanical valve) -Patient received 3 units of FFP prior to surgical intervention yesterday -Restart Coumadin  with heparin bridge -hesitant to send pt home with enoxaparin bridge as the patient is ESRD which would increase risk of bleeding with enoxaparin  -Monitor closely for re-bleed; monitor platelets Chronic systolic and diastolic CHF -57/90/3833 echo shows EF 55% -Appears euvolemic presently Hypertension -Continue metoprolol tartrate Status post liver transplant  -Continue tacrolimus  hypothyroidism -Continue Synthroid DM type 2 -SSI -A1C--6.7  CAD -continue ASA, BB  Family Communication: Pt at beside Disposition Plan: Home when medically stable   Procedures/Studies: Dg Chest Portable 1 View  03/07/2014   CLINICAL DATA:  Acute chest pain since yesterday.  EXAM: PORTABLE CHEST - 1 VIEW  COMPARISON:  February 19, 2014.  FINDINGS: Stable cardiomediastinal silhouette. Sternotomy wires are noted. No pneumothorax or significant pleural effusion is noted. No acute pulmonary disease is noted. Bony thorax appears intact.  IMPRESSION: No acute cardiopulmonary abnormality seen.   Electronically Signed   By: Sabino Dick M.D.   On: 03/07/2014 08:15         Subjective: Patient denies fevers, chills, headache, chest pain, dyspnea, nausea, vomiting, diarrhea, abdominal pain, dysuria, hematuria   Objective: Filed Vitals:   04/01/14 1015 04/01/14 1030 04/01/14 1058 04/01/14 1309  BP: 128/59 142/61 156/57 112/41  Pulse: 59 58 62 70  Temp: 97.7 F (36.5 C) 97.5 F (36.4 C) 98.8 F (37.1 C) 98 F (36.7 C)  TempSrc: Oral Oral Oral Oral  Resp: 10 12 11 14   Height:      Weight:   77.1 kg (169 lb 15.6 oz)   SpO2:   100% 99%    Intake/Output Summary (Last 24 hours) at 04/01/14 1740 Last data filed at 04/01/14 1310  Gross per 24 hour  Intake    240 ml  Output  1165 ml  Net   -925 ml   Weight change: 3.856 kg (8 lb 8 oz) Exam:   General:  Pt is alert, follows commands appropriately, not in acute distress  HEENT: No icterus, No thrush, [place]/AT  Cardiovascular: RRR, S1/S2, no  rubs, no gallops  Respiratory: CTA bilaterally, no wheezing, no crackles, no rhonchi  Abdomen: Soft/+BS, non tender, non distended, no guarding  Extremities: No edema, No lymphangitis, No petechiae, No rashes, no synovitis  Data Reviewed: Basic Metabolic Panel:  Recent Labs Lab 03/30/14 1649 03/31/14 0036 03/31/14 0154 04/01/14 0230  NA 137 140 138 136  K 4.3 4.2 4.2 5.3*  CL 96 98 97 98  CO2  --  34* 34* 30  GLUCOSE 146* 181* 177* 150*  BUN 11 14 16  35*  CREATININE 3.90* 4.64* 4.81* 7.46*  CALCIUM  --  7.8* 7.8* 7.8*   Liver Function Tests:  Recent Labs Lab 03/31/14 0154 04/01/14 0230  AST 39* 28  ALT 24 9  ALKPHOS 71 76  BILITOT 0.7 0.7  PROT 5.9* 5.8*  ALBUMIN 2.8* 2.7*   No results for input(s): LIPASE, AMYLASE in the last 168 hours. No results for input(s): AMMONIA in the last 168 hours. CBC:  Recent Labs Lab 03/30/14 1636 03/30/14 1649 03/31/14 0036 03/31/14 0154 03/31/14 0725 04/01/14 0230  WBC 5.1  --  4.0 4.4  --  4.4  NEUTROABS 3.1  --   --  2.8  --  2.6  HGB 9.0* 10.2* 6.7* 6.5* 8.7* 8.0*  HCT 28.1* 30.0* 20.9* 20.1* 26.5* 24.3*  MCV 99.3  --  101.5* 99.0  --  95.3  PLT 91*  --  72* 83*  --  66*   Cardiac Enzymes: No results for input(s): CKTOTAL, CKMB, CKMBINDEX, TROPONINI in the last 168 hours. BNP: Invalid input(s): POCBNP CBG:  Recent Labs Lab 03/31/14 1849 03/31/14 2026 04/01/14 0003 04/01/14 0412 04/01/14 1148  GLUCAP 217* 130* 151* 160* 103*    No results found for this or any previous visit (from the past 240 hour(s)).   Scheduled Meds: . sodium chloride   Intravenous Once  . sodium chloride   Intravenous Once  . artificial tears  1 application Right Eye V7Q  . aspirin EC  81 mg Oral Daily  . calcitRIOL  0.5 mcg Oral Q T,Th,Sa-HD  . calcium acetate  2,001 mg Oral TID WC  . cinacalcet  30 mg Oral QPC supper  . Darbepoetin Alfa      . docusate sodium  100 mg Oral Daily  . dorzolamide-timolol  1 drop Right Eye Q4H    . gabapentin  300 mg Oral QHS  . insulin aspart  0-9 Units Subcutaneous 6 times per day  . levothyroxine  150 mcg Oral QAC breakfast  . metoCLOPramide  10 mg Oral TID AC  . metoprolol tartrate  25 mg Oral BID  . multivitamin  1 tablet Oral QHS  . pantoprazole  40 mg Oral QAC breakfast  . sodium chloride  3 mL Intravenous Q12H  . tacrolimus  3 mg Oral BID  . warfarin  4 mg Oral ONCE-1800  . Warfarin - Pharmacist Dosing Inpatient   Does not apply q1800   Continuous Infusions: . heparin 1,100 Units/hr (04/01/14 0545)     Zuly Belkin, DO  [facility] Pager [phone]  If 7PM-7AM, please contact night-coverage [email or web address] Password [email or web address] 04/01/2014, 5:40 PM   LOS: 2 days

## 2014-04-02 DIAGNOSIS — Z720 Tobacco use: Secondary | ICD-10-CM

## 2014-04-02 LAB — TYPE AND SCREEN
ABO/RH(D): A POS
Antibody Screen: NEGATIVE
Unit division: 0
Unit division: 0

## 2014-04-02 LAB — GLUCOSE, CAPILLARY
GLUCOSE-CAPILLARY: 102 mg/dL — AB (ref 70–99)
GLUCOSE-CAPILLARY: 176 mg/dL — AB (ref 70–99)
GLUCOSE-CAPILLARY: 202 mg/dL — AB (ref 70–99)
Glucose-Capillary: 132 mg/dL — ABNORMAL HIGH (ref 70–99)
Glucose-Capillary: 138 mg/dL — ABNORMAL HIGH (ref 70–99)
Glucose-Capillary: 170 mg/dL — ABNORMAL HIGH (ref 70–99)

## 2014-04-02 LAB — CBC WITH DIFFERENTIAL/PLATELET
BASOS ABS: 0 10*3/uL (ref 0.0–0.1)
BASOS PCT: 0 % (ref 0–1)
EOS ABS: 0.1 10*3/uL (ref 0.0–0.7)
Eosinophils Relative: 3 % (ref 0–5)
HCT: 27.5 % — ABNORMAL LOW (ref 36.0–46.0)
HEMOGLOBIN: 8.9 g/dL — AB (ref 12.0–15.0)
Lymphocytes Relative: 28 % (ref 12–46)
Lymphs Abs: 1.5 10*3/uL (ref 0.7–4.0)
MCH: 30.5 pg (ref 26.0–34.0)
MCHC: 32.4 g/dL (ref 30.0–36.0)
MCV: 94.2 fL (ref 78.0–100.0)
MONOS PCT: 14 % — AB (ref 3–12)
Monocytes Absolute: 0.7 10*3/uL (ref 0.1–1.0)
Neutro Abs: 2.8 10*3/uL (ref 1.7–7.7)
Neutrophils Relative %: 55 % (ref 43–77)
Platelets: 85 10*3/uL — ABNORMAL LOW (ref 150–400)
RBC: 2.92 MIL/uL — ABNORMAL LOW (ref 3.87–5.11)
RDW: 18.1 % — AB (ref 11.5–15.5)
WBC: 5.1 10*3/uL (ref 4.0–10.5)

## 2014-04-02 LAB — COMPREHENSIVE METABOLIC PANEL
ALT: 6 U/L (ref 0–35)
ANION GAP: 7 (ref 5–15)
AST: 33 U/L (ref 0–37)
Albumin: 2.6 g/dL — ABNORMAL LOW (ref 3.5–5.2)
Alkaline Phosphatase: 73 U/L (ref 39–117)
BILIRUBIN TOTAL: 0.7 mg/dL (ref 0.3–1.2)
BUN: 18 mg/dL (ref 6–23)
CALCIUM: 7.9 mg/dL — AB (ref 8.4–10.5)
CO2: 29 mmol/L (ref 19–32)
Chloride: 100 mmol/L (ref 96–112)
Creatinine, Ser: 5.41 mg/dL — ABNORMAL HIGH (ref 0.50–1.10)
GFR, EST AFRICAN AMERICAN: 9 mL/min — AB (ref >90)
GFR, EST NON AFRICAN AMERICAN: 8 mL/min — AB (ref >90)
Glucose, Bld: 103 mg/dL — ABNORMAL HIGH (ref 70–99)
POTASSIUM: 4.4 mmol/L (ref 3.5–5.1)
SODIUM: 136 mmol/L (ref 135–145)
TOTAL PROTEIN: 5.7 g/dL — AB (ref 6.0–8.3)

## 2014-04-02 LAB — HEPARIN LEVEL (UNFRACTIONATED): Heparin Unfractionated: 0.37 IU/mL (ref 0.30–0.70)

## 2014-04-02 LAB — PROTIME-INR
INR: 1.48 (ref 0.00–1.49)
Prothrombin Time: 18 seconds — ABNORMAL HIGH (ref 11.6–15.2)

## 2014-04-02 MED ORDER — WARFARIN SODIUM 4 MG PO TABS
4.0000 mg | ORAL_TABLET | Freq: Once | ORAL | Status: AC
  Administered 2014-04-02: 4 mg via ORAL
  Filled 2014-04-02: qty 1

## 2014-04-02 MED ORDER — INSULIN ASPART 100 UNIT/ML ~~LOC~~ SOLN
0.0000 [IU] | Freq: Three times a day (TID) | SUBCUTANEOUS | Status: DC
  Administered 2014-04-02: 3 [IU] via SUBCUTANEOUS
  Administered 2014-04-03 (×2): 1 [IU] via SUBCUTANEOUS
  Administered 2014-04-03: 2 [IU] via SUBCUTANEOUS

## 2014-04-02 MED ORDER — INSULIN ASPART 100 UNIT/ML ~~LOC~~ SOLN: 0.0000 [IU] | Freq: Three times a day (TID) | SUBCUTANEOUS | Status: DC

## 2014-04-02 MED ORDER — INSULIN ASPART 100 UNIT/ML ~~LOC~~ SOLN: 0.0000 [IU] | Freq: Every day | SUBCUTANEOUS | Status: DC

## 2014-04-02 NOTE — Progress Notes (Signed)
ANTICOAGULATION CONSULT NOTE - Follow Up Consult  Pharmacy Consult for heparin  Indication: mechanical AVR  Allergies  Allergen Reactions  . Acetaminophen Other (See Comments)    Liver transplant recipient   . Codeine Itching  . Mirtazapine Other (See Comments)    hallucination  . Morphine Itching    Patient Measurements: Height: 5\' 4"  (162.6 cm) Weight: 172 lb 2.9 oz (78.1 kg) IBW/kg (Calculated) : 54.7 Heparin Dosing Weight: 70 kg  Vital Signs: Temp: 98.5 F (36.9 C) (02/21 0441) Temp Source: Oral (02/21 0441) BP: 122/48 mmHg (02/21 1043) Pulse Rate: 66 (02/21 0441)  Labs:  Recent Labs  03/31/14 0154  03/31/14 0725 03/31/14 1359 04/01/14 0230 04/01/14 1220 04/02/14 0337  HGB 6.5*  --  8.7*  --  8.0*  --  8.9*  HCT 20.1*  --  26.5*  --  24.3*  --  27.5*  PLT 83*  --   --   --  66*  --  85*  LABPROT  --   --   --  14.7  --  15.1 18.0*  INR  --   --   --  1.13  --  1.17 1.48  HEPARINUNFRC  --   < >  --  0.19*  --  0.47 0.37  CREATININE 4.81*  --   --   --  7.46*  --  5.41*  < > = values in this interval not displayed.  Estimated Creatinine Clearance: 11.3 mL/min (by C-G formula based on Cr of 5.41).  Assessment: 59 yo female with history of ESRD on HD TTS and mechanical AVR presents with uncontrolled bleeding of left thigh AV graft. Pt received phytonadione 10mg  IV at 1552 and 3u of FFP on 2/18. INR on admission was 2.77. S/p L thigh AVG revision on 2/18 with heparin and coumadin resumed after procedure. Hgb 8.9 (s/p 1 unit PRBC on 2/20), plt up 85. Heparin level is at goal with 0.77. INR is subtherapeutic but is increasing towards goal range. No active bleeding documented.    Goal of Therapy:  Heparin level 0.3-0.7 units/ml; INR goal 2.5-3 (per anti-coag clinic) Monitor platelets by anticoagulation protocol: Yes    Plan:  - continue heparin drip at 1100 units/hr - repeat coumadin 4mg  PO x1 today   Ticia Virgo P 04/02/2014,11:42 AM

## 2014-04-02 NOTE — Discharge Instructions (Signed)

## 2014-04-02 NOTE — Progress Notes (Signed)
[facility] Rounding Note Subjective:  Feels fine Wants to go home but understands need for re-anticoagulation for her valve  Objective Vital signs in last 24 hours: Filed Vitals:   04/01/14 1309 04/01/14 2035 04/01/14 2134 04/02/14 0441  BP: 112/41 138/31 130/60 129/51  Pulse: 70 73  66  Temp: 98 F (36.7 C) 98.7 F (37.1 C)  98.5 F (36.9 C)  TempSrc: Oral Oral  Oral  Resp: 14 16  17   Height:      Weight:    78.1 kg (172 lb 2.9 oz)  SpO2: 99% 98%  97%   Weight change: -1.6 kg (-3 lb 8.4 oz)  Intake/Output Summary (Last 24 hours) at 04/02/14 0818 Last data filed at 04/01/14 1310  Gross per 24 hour  Intake    240 ml  Output   1165 ml  Net   -925 ml   Physical Exam:  BP 129/51 mmHg  Pulse 66  Temp(Src) 98.5 F (36.9 C) (Oral)  Resp 17  Ht 5\' 4"  (1.626 m)  Wt 78.1 kg (172 lb 2.9 oz)  BMI 29.54 kg/m2  SpO2 97% Pleasant AAF NAD Lying in bed watching Sunday sermons No JVD Lungs clear Regular rhythm S1S2 No S3  Crisp valve sound Soft non tender abdomen with scars from prior surgeries No edema of the lower extremities Left thigh graft patent .Incision from revision is dry with dressing covering.  Labs: Basic Metabolic Panel:  Recent Labs Lab 03/30/14 1649 03/31/14 0036 03/31/14 0154 04/01/14 0230 04/02/14 0337  NA 137 140 138 136 136  K 4.3 4.2 4.2 5.3* 4.4  CL 96 98 97 98 100  CO2  --  34* 34* 30 29  GLUCOSE 146* 181* 177* 150* 103*  BUN 11 14 16  35* 18  CREATININE 3.90* 4.64* 4.81* 7.46* 5.41*  CALCIUM  --  7.8* 7.8* 7.8* 7.9*     Recent Labs Lab 03/31/14 0154 04/01/14 0230 04/02/14 0337  AST 39* 28 33  ALT 24 9 6   ALKPHOS 71 76 73  BILITOT 0.7 0.7 0.7  PROT 5.9* 5.8* 5.7*  ALBUMIN 2.8* 2.7* 2.6*    Recent Labs Lab 03/30/14 1636  03/31/14 0036 03/31/14 0154 03/31/14 0725 04/01/14 0230 04/02/14 0337  WBC 5.1  --  4.0 4.4  --  4.4 5.1  NEUTROABS 3.1  --   --  2.8  --  2.6 2.8  HGB 9.0*  < > 6.7* 6.5* 8.7* 8.0* 8.9*   HCT 28.1*  < > 20.9* 20.1* 26.5* 24.3* 27.5*  MCV 99.3  --  101.5* 99.0  --  95.3 94.2  PLT 91*  --  72* 83*  --  66* 85*  < > = values in this interval not displayed.  Recent Labs Lab 04/01/14 1612 04/01/14 1944 04/02/14 0049 04/02/14 0359 04/02/14 0758  GLUCAP 171* 154* 138* 102* 132*   Lab Results  Component Value Date   INR 1.48 04/02/2014   INR 1.17 04/01/2014   INR 1.13 03/31/2014    Studies/Results: No results found. Medications: . heparin 1,100 Units/hr (04/02/14 0107)   . sodium chloride   Intravenous Once  . sodium chloride   Intravenous Once  . artificial tears  1 application Right Eye L4T  . aspirin EC  81 mg Oral Daily  . calcitRIOL  0.5 mcg Oral Q T,Th,Sa-HD  . calcium acetate  2,001 mg Oral TID WC  . cinacalcet  30 mg Oral QPC supper  . docusate sodium  100 mg  Oral Daily  . dorzolamide-timolol  1 drop Right Eye Q4H  . gabapentin  300 mg Oral QHS  . insulin aspart  0-9 Units Subcutaneous 6 times per day  . levothyroxine  150 mcg Oral QAC breakfast  . metoCLOPramide  10 mg Oral TID AC  . metoprolol tartrate  25 mg Oral BID  . multivitamin  1 tablet Oral QHS  . pantoprazole  40 mg Oral QAC breakfast  . sodium chloride  3 mL Intravenous Q12H  . tacrolimus  3 mg Oral BID  . Warfarin - Pharmacist Dosing Inpatient   Does not apply q1800   Dialysis Prescription: [facility] TTS 3 hours 45 minutes 400/A1.5, 2K 2.25 Ca, no heparin. EDW 76.5 kg.  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 The patient finished a successful session of dialysis in [place]. There was difficulty with continued  bleeding at her dialysis access site, requiring surgical revision by Dr. Bridgett Larsson on 2/18.  Because of her mechanical heart valve and chronic coumadin INR was reversed for surgery. Graft successfully used for dialysis 2/20 with cannulation of the medial side only. However due to need for therapeutic INR and re-coumadinization, she remains an inpatient.  Assessment/Recommendations 1. ESRD - HD TTS [place].  Next HD Tuesday. Use medial side of graft only. HD 2/20 no issues - finished at 77.1 kg so 0.6 above EDW of 76.5. 2. Anemia - ESRD + ABLA. Hb up to 8.9 from nadir of 6.5  post 3 units PRBC's. Outpt Mircera 75 mcg  Q2weeks last received 2/11.  3. S/p AVG revision due to prolonged bleeding - findings of degenerative graft without frank rupture or infection. s/p total of 3 units of PRBC's this admission, including one on HD 2/20 4. Aortic stenosis with mechanical valve - 4 u FFP on adm. Coumadin restarted. Heparin bridging 5. Systolic and diastolic CHF - no CHF now.  Euvolemic. 6. HTN - meds 7. Liver transplant - usual meds 8. DM2 - SSI 9. Secondary hyperpara - calcitriol, sensipar 10. Hypothyroidism - usual levothyroxine 11. CAD -  On aspirin and BB   Jamal Maes, MD [facility] [phone] pager 04/02/2014, 8:18 AM

## 2014-04-02 NOTE — Progress Notes (Addendum)
PROGRESS NOTE  [name] KZS:[id number] DOB: 11-10-1955 DOA: 03/30/2014 PCP: No primary care provider on file.    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 The patient finished a successful session of dialysis in [place]. There was difficulty with continued bleeding at her dialysis access site. An attempt was made to stop the bleeding by placing a stitch at [facility]. She continued to bleed. She was transferred to [facility], for further evaluation. The patient was seen by vascular surgery, Dr. Bridgett Larsson. She was taken to the OR for repair/revision of her left thigh AV graft on the evening of 03/30/2014. Assessment/Plan: Acute blood loss anemia -Baseline hemoglobin around 10 -Presented hemoglobin 6.7 -Patient transfused 2 units PRBC for the admission -Continue to monitor inpatient given the patient's comorbidities including continued necessity of warfarin -hesitant to send pt home with enoxaparin Bridge as the patient is ESRD which would increase risk of bleeding with enoxaparin  -Continue heparin drip as a bridge for warfarin  AV graft bleeding/degeneration -Appreciate Dr. Bridgett Larsson follow-up -tolerated HD after graft revision -03/30/2014 Status post left thigh AV graft repair ESRD -Nephrology has been notified of the patient's maintenance dialysis -Patient dialyzes Tuesday, Thursday, Saturday in [place] Aortic stenosis status post aortic valve replacement (mechanical valve) -Patient received 4 units of FFP prior to surgical intervention -Restarted Coumadin with heparin bridge -hesitant to send pt home with enoxaparin bridge  as the patient is ESRD which would increase risk of bleeding with enoxaparin  -Monitor closely for re-bleed; monitor platelets Chronic systolic and diastolic CHF -73/22/0254 echo shows EF 55% -Appears euvolemic presently Hypertension -Continue metoprolol tartrate Status post liver transplant  -Continue tacrolimus  hypothyroidism -Continue Synthroid DM type 2 -SSI -A1C--6.7  CAD -continue ASA, BB  Family Communication: Pt at beside Disposition Plan: Home when INR 2-3   Procedures/Studies: Dg Chest Portable 1 View  03/07/2014   CLINICAL DATA:  Acute chest pain since yesterday.  EXAM: PORTABLE CHEST - 1 VIEW  COMPARISON:  February 19, 2014.  FINDINGS: Stable cardiomediastinal silhouette. Sternotomy wires are noted. No pneumothorax or significant pleural effusion is noted. No acute pulmonary disease is noted. Bony thorax appears intact.  IMPRESSION: No acute cardiopulmonary abnormality seen.   Electronically Signed   By: Sabino Dick M.D.   On: 03/07/2014 08:15         Subjective: .dtr  Objective: Filed Vitals:   04/01/14 2134 04/02/14 0441 04/02/14 1043 04/02/14 1430  BP: 130/60 129/51 122/48 107/50  Pulse:  66  60  Temp:  98.5 F (36.9 C)  98.7 F (37.1 C)  TempSrc:  Oral  Oral  Resp:  17  18  Height:      Weight:  78.1 kg (172 lb 2.9 oz)    SpO2:  97%  97%    Intake/Output Summary (Last 24 hours) at 04/02/14 1749 Last data filed at 04/02/14 1300  Gross per 24 hour  Intake    483 ml  Output      0 ml  Net    483 ml   Weight change: -1.6 kg (-3 lb 8.4 oz) Exam:   General:  Pt is alert, follows commands appropriately, not  in acute distress  HEENT: No icterus, No thrush, [place]/AT  Cardiovascular: RRR, S1/S2, no rubs, no gallops  Respiratory: CTA bilaterally, no wheezing, no crackles, no rhonchi  Abdomen: Soft/+BS, non tender, non distended, no guarding  Extremities: No edema, No lymphangitis, No petechiae, No rashes, no synovitis;L-thigh without  erythema or induration  Data Reviewed: Basic Metabolic Panel:  Recent Labs Lab 03/30/14 1649 03/31/14 0036 03/31/14 0154 04/01/14 0230 04/02/14 0337  NA 137 140 138 136 136  K 4.3 4.2 4.2 5.3* 4.4  CL 96 98 97 98 100  CO2  --  34* 34* 30 29  GLUCOSE 146* 181* 177* 150* 103*  BUN 11 14 16  35* 18  CREATININE 3.90* 4.64* 4.81* 7.46* 5.41*  CALCIUM  --  7.8* 7.8* 7.8* 7.9*   Liver Function Tests:  Recent Labs Lab 03/31/14 0154 04/01/14 0230 04/02/14 0337  AST 39* 28 33  ALT 24 9 6   ALKPHOS 71 76 73  BILITOT 0.7 0.7 0.7  PROT 5.9* 5.8* 5.7*  ALBUMIN 2.8* 2.7* 2.6*   No results for input(s): LIPASE, AMYLASE in the last 168 hours. No results for input(s): AMMONIA in the last 168 hours. CBC:  Recent Labs Lab 03/30/14 1636  03/31/14 0036 03/31/14 0154 03/31/14 0725 04/01/14 0230 04/02/14 0337  WBC 5.1  --  4.0 4.4  --  4.4 5.1  NEUTROABS 3.1  --   --  2.8  --  2.6 2.8  HGB 9.0*  < > 6.7* 6.5* 8.7* 8.0* 8.9*  HCT 28.1*  < > 20.9* 20.1* 26.5* 24.3* 27.5*  MCV 99.3  --  101.5* 99.0  --  95.3 94.2  PLT 91*  --  72* 83*  --  66* 85*  < > = values in this interval not displayed. Cardiac Enzymes: No results for input(s): CKTOTAL, CKMB, CKMBINDEX, TROPONINI in the last 168 hours. BNP: Invalid input(s): POCBNP CBG:  Recent Labs Lab 04/02/14 0049 04/02/14 0359 04/02/14 0758 04/02/14 1112 04/02/14 1604  GLUCAP 138* 102* 132* 176* 202*    No results found for this or any previous visit (from the past 240 hour(s)).   Scheduled Meds: . sodium chloride   Intravenous Once  . sodium chloride   Intravenous Once  . artificial tears  1 application Right Eye Q9I  . aspirin EC  81 mg Oral Daily  . calcitRIOL  0.5 mcg Oral Q T,Th,Sa-HD  . calcium acetate  2,001 mg Oral TID WC  . cinacalcet  30 mg Oral QPC supper  . docusate sodium  100 mg Oral Daily  . dorzolamide-timolol  1 drop Right Eye Q4H  . gabapentin  300 mg Oral QHS  . insulin aspart  0-5 Units Subcutaneous  QHS  . insulin aspart  0-9 Units Subcutaneous TID WC  . levothyroxine  150 mcg Oral QAC breakfast  . metoCLOPramide  10 mg Oral TID AC  . metoprolol tartrate  25 mg Oral BID  . multivitamin  1 tablet Oral QHS  . pantoprazole  40 mg Oral QAC breakfast  . sodium chloride  3 mL Intravenous Q12H  . tacrolimus  3 mg Oral BID  . Warfarin - Pharmacist Dosing Inpatient   Does not apply q1800   Continuous Infusions: . heparin 1,100 Units/hr (04/02/14 0107)     Hasheem Voland, DO  [facility] Pager [phone]  If 7PM-7AM, please contact night-coverage [email or web address] Password [facility] 04/02/2014, 5:49 PM   LOS: 3 days

## 2014-04-03 LAB — PROTIME-INR
INR: 1.93 — ABNORMAL HIGH (ref 0.00–1.49)
PROTHROMBIN TIME: 22.2 s — AB (ref 11.6–15.2)

## 2014-04-03 LAB — COMPREHENSIVE METABOLIC PANEL
ALT: 7 U/L (ref 0–35)
AST: 31 U/L (ref 0–37)
Albumin: 2.6 g/dL — ABNORMAL LOW (ref 3.5–5.2)
Alkaline Phosphatase: 75 U/L (ref 39–117)
Anion gap: 10 (ref 5–15)
BUN: 33 mg/dL — ABNORMAL HIGH (ref 6–23)
CALCIUM: 7.9 mg/dL — AB (ref 8.4–10.5)
CHLORIDE: 99 mmol/L (ref 96–112)
CO2: 27 mmol/L (ref 19–32)
CREATININE: 7.74 mg/dL — AB (ref 0.50–1.10)
GFR calc non Af Amer: 5 mL/min — ABNORMAL LOW (ref >90)
GFR, EST AFRICAN AMERICAN: 6 mL/min — AB (ref >90)
Glucose, Bld: 152 mg/dL — ABNORMAL HIGH (ref 70–99)
Potassium: 4.6 mmol/L (ref 3.5–5.1)
SODIUM: 136 mmol/L (ref 135–145)
TOTAL PROTEIN: 5.8 g/dL — AB (ref 6.0–8.3)
Total Bilirubin: 0.5 mg/dL (ref 0.3–1.2)

## 2014-04-03 LAB — GLUCOSE, CAPILLARY
GLUCOSE-CAPILLARY: 186 mg/dL — AB (ref 70–99)
Glucose-Capillary: 129 mg/dL — ABNORMAL HIGH (ref 70–99)
Glucose-Capillary: 130 mg/dL — ABNORMAL HIGH (ref 70–99)
Glucose-Capillary: 137 mg/dL — ABNORMAL HIGH (ref 70–99)
Glucose-Capillary: 154 mg/dL — ABNORMAL HIGH (ref 70–99)
Glucose-Capillary: 171 mg/dL — ABNORMAL HIGH (ref 70–99)

## 2014-04-03 LAB — CBC WITH DIFFERENTIAL/PLATELET
Basophils Absolute: 0 10*3/uL (ref 0.0–0.1)
Basophils Relative: 0 % (ref 0–1)
EOS PCT: 3 % (ref 0–5)
Eosinophils Absolute: 0.2 10*3/uL (ref 0.0–0.7)
HEMATOCRIT: 27.2 % — AB (ref 36.0–46.0)
Hemoglobin: 8.9 g/dL — ABNORMAL LOW (ref 12.0–15.0)
LYMPHS PCT: 29 % (ref 12–46)
Lymphs Abs: 1.4 10*3/uL (ref 0.7–4.0)
MCH: 30.8 pg (ref 26.0–34.0)
MCHC: 32.7 g/dL (ref 30.0–36.0)
MCV: 94.1 fL (ref 78.0–100.0)
MONO ABS: 0.7 10*3/uL (ref 0.1–1.0)
Monocytes Relative: 14 % — ABNORMAL HIGH (ref 3–12)
NEUTROS ABS: 2.5 10*3/uL (ref 1.7–7.7)
NEUTROS PCT: 54 % (ref 43–77)
Platelets: 69 10*3/uL — ABNORMAL LOW (ref 150–400)
RBC: 2.89 MIL/uL — ABNORMAL LOW (ref 3.87–5.11)
RDW: 17.5 % — ABNORMAL HIGH (ref 11.5–15.5)
WBC: 4.7 10*3/uL (ref 4.0–10.5)

## 2014-04-03 LAB — HEPARIN LEVEL (UNFRACTIONATED): HEPARIN UNFRACTIONATED: 0.45 [IU]/mL (ref 0.30–0.70)

## 2014-04-03 LAB — PHOSPHORUS: Phosphorus: 3.8 mg/dL (ref 2.3–4.6)

## 2014-04-03 MED ORDER — LACTULOSE 10 GM/15ML PO SOLN
20.0000 g | Freq: Every day | ORAL | Status: DC | PRN
  Administered 2014-04-03: 20 g via ORAL
  Filled 2014-04-03 (×2): qty 30

## 2014-04-03 MED ORDER — WARFARIN SODIUM 4 MG PO TABS
4.0000 mg | ORAL_TABLET | Freq: Once | ORAL | Status: AC
  Administered 2014-04-03: 4 mg via ORAL
  Filled 2014-04-03: qty 1

## 2014-04-03 NOTE — Progress Notes (Signed)
   [facility]  Subjective  - Patient doing well.  It looks like they used the lateral side of the graft for dialysis.  No problems, but they should use the medial side only.    Objective 152/37 71 98.6 F (37 C) (Oral) 18 99%  Intake/Output Summary (Last 24 hours) at 04/03/14 0727 Last data filed at 04/02/14 2234  Gross per 24 hour  Intake    705 ml  Output      0 ml  Net    705 ml    Palpable thrill left thigh graft Left foot distally warm and well perfused with intact sensation.    Assessment/Planning: POD #3 revision left lateral thigh graft  F/U as needed in the office you may stick lateral graft in 4 weeks.  For now stick only medial graft.   Laurence Slate [facility] 04/03/2014 7:27 AM --  Laboratory Lab Results:  Recent Labs  04/02/14 0337 04/03/14 0425  WBC 5.1 4.7  HGB 8.9* 8.9*  HCT 27.5* 27.2*  PLT 85* 69*   BMET  Recent Labs  04/02/14 0337 04/03/14 0425  NA 136 136  K 4.4 4.6  CL 100 99  CO2 29 27  GLUCOSE 103* 152*  BUN 18 33*  CREATININE 5.41* 7.74*  CALCIUM 7.9* 7.9*    COAG Lab Results  Component Value Date   INR 1.93* 04/03/2014   INR 1.48 04/02/2014   INR 1.17 04/01/2014   No results found for: PTT

## 2014-04-03 NOTE — Progress Notes (Signed)
[facility] Progress Note   Subjective: no complaints  Filed Vitals:   04/02/14 1430 04/02/14 1954 04/02/14 2146 04/03/14 0432  BP: 107/50 112/45 135/50 152/37  Pulse: 60 66 67 71  Temp: 98.7 F (37.1 C) 98.2 F (36.8 C)  98.6 F (37 C)  TempSrc: Oral Oral  Oral  Resp: 18 17  18   Height:      Weight:    79.2 kg (174 lb 9.7 oz)  SpO2: 97% 100%  99%   Exam: AAF NAD Lying in bed No JVD Lungs clear Regular rhythm S1S2 No S3 Crisp valve sound Soft non tender abdomen with scars from prior surgeries No edema of the lower extremities Left thigh graft patent .Incision from revision is dry with dressing covering  HD: TTS [place] 3h 71min   400/A1.5   2/2.25 Ca   Heparin none  76.5 kg  Mircera 75 mcg every 2 weeks Calcitriol 0.5 mcg TIW  Assessment: 1. Bleeding L thigh AVG - s/p revision L thigh AVG w 4 cm resection and placement of interposition AVG in its place. No signs of infection. To stick medial side of graft only for now.  2. ESRD on HD TTS 3. Anemia next Mircera due 2/25, s/p 3u prbc this admit 4. Hx mechanical AVR on coumadin, hep; INR up to 1.9 today 5. HTN cont meds 6. Liver transplant - cont meds 7. DM on SSI 8. MBD cont vit D, sensipar 9. CAD on asa, BB  Plan- HD tomorrow, for dc when INR in range    [name] MD  pager [phone]    cell [phone]  04/03/2014, 11:36 AM     Recent Labs Lab 04/01/14 0230 04/02/14 0337 04/03/14 0425  NA 136 136 136  K 5.3* 4.4 4.6  CL 98 100 99  CO2 30 29 27   GLUCOSE 150* 103* 152*  BUN 35* 18 33*  CREATININE 7.46* 5.41* 7.74*  CALCIUM 7.8* 7.9* 7.9*  PHOS  --   --  3.8    Recent Labs Lab 04/01/14 0230 04/02/14 0337 04/03/14 0425  AST 28 33 31  ALT 9 6 7   ALKPHOS 76 73 75  BILITOT 0.7 0.7 0.5  PROT 5.8* 5.7* 5.8*  ALBUMIN 2.7* 2.6* 2.6*    Recent Labs Lab 04/01/14 0230 04/02/14 0337 04/03/14 0425  WBC 4.4 5.1 4.7  NEUTROABS 2.6 2.8 2.5  HGB 8.0* 8.9* 8.9*  HCT 24.3*  27.5* 27.2*  MCV 95.3 94.2 94.1  PLT 66* 85* 69*   . sodium chloride   Intravenous Once  . sodium chloride   Intravenous Once  . artificial tears  1 application Right Eye Y1P  . aspirin EC  81 mg Oral Daily  . calcitRIOL  0.5 mcg Oral Q T,Th,Sa-HD  . calcium acetate  2,001 mg Oral TID WC  . cinacalcet  30 mg Oral QPC supper  . docusate sodium  100 mg Oral Daily  . dorzolamide-timolol  1 drop Right Eye Q4H  . gabapentin  300 mg Oral QHS  . insulin aspart  0-5 Units Subcutaneous QHS  . insulin aspart  0-9 Units Subcutaneous TID WC  . levothyroxine  150 mcg Oral QAC breakfast  . metoCLOPramide  10 mg Oral TID AC  . metoprolol tartrate  25 mg Oral BID  . multivitamin  1 tablet Oral QHS  . pantoprazole  40 mg Oral QAC breakfast  . sodium chloride  3 mL Intravenous Q12H  . tacrolimus  3 mg Oral  BID  . warfarin  4 mg Oral ONCE-1800  . Warfarin - Pharmacist Dosing Inpatient   Does not apply q1800   . heparin 1,100 Units/hr (04/02/14 2352)   albuterol, bisacodyl, calcium acetate, guaiFENesin-dextromethorphan, hydrALAZINE, labetalol, lactulose, metoprolol, morphine injection, morphine injection, nitroGLYCERIN, ondansetron, ondansetron, oxyCODONE-acetaminophen, phenol, traMADol

## 2014-04-03 NOTE — Care Management Note (Signed)
    Page 1 of 1   04/04/2014     3:23:23 PM CARE MANAGEMENT NOTE 04/04/2014  Patient:  [name], [name]   Account Number:  [id number]  Date Initiated:  04/03/2014  Documentation initiated by:  COLE,ANGELA  Subjective/Objective Assessment:   PTA from home admitted with bleeding left thigh AV  graft site.     Action/Plan:   Return to home when stable.   Anticipated DC Date:  04/04/2014   Anticipated DC Plan:  HOME/SELF CARE         Choice offered to / List presented to:             Status of service:  Completed, signed off Medicare Important Message given?  YES (If response is "NO", the following Medicare IM given date fields will be blank) Date Medicare IM given:  04/03/2014 Medicare IM given by:  Whitman Hero Date Additional Medicare IM given:   Additional Medicare IM given by:    Discharge Disposition:  HOME/SELF CARE  Per UR Regulation:  Reviewed for med. necessity/level of care/duration of stay  If discussed at [place] of Stay Meetings, dates discussed:   04/04/2014    Comments:

## 2014-04-03 NOTE — Progress Notes (Signed)
ANTICOAGULATION CONSULT NOTE - Follow Up Consult  Pharmacy Consult for Heparin and Coumadin Indication: mechanical AVR  Allergies  Allergen Reactions  . Acetaminophen Other (See Comments)    Liver transplant recipient   . Codeine Itching  . Mirtazapine Other (See Comments)    hallucination  . Morphine Itching    Patient Measurements: Height: 5\' 4"  (162.6 cm) Weight: 174 lb 9.7 oz (79.2 kg) IBW/kg (Calculated) : 54.7 Heparin Dosing Weight: 70 kg  Vital Signs: Temp: 98.6 F (37 C) (02/22 0432) Temp Source: Oral (02/22 0432) BP: 152/37 mmHg (02/22 0432) Pulse Rate: 71 (02/22 0432)  Labs:  Recent Labs  04/01/14 0230 04/01/14 1220 04/02/14 0337 04/03/14 0425  HGB 8.0*  --  8.9* 8.9*  HCT 24.3*  --  27.5* 27.2*  PLT 66*  --  85* 69*  LABPROT  --  15.1 18.0* 22.2*  INR  --  1.17 1.48 1.93*  HEPARINUNFRC  --  0.47 0.37 0.45  CREATININE 7.46*  --  5.41* 7.74*    Estimated Creatinine Clearance: 8 mL/min (by C-G formula based on Cr of 7.74).   Medications: Heparin @ 1100 units/hr  Assessment: 58yof with history of ESRD on HD TTS and mechanical AVR on coumadin presented 2/18 with uncontrolled bleeding of left thigh AV graft. INR on admit was 2.77. She received vitamin k 10mg  IV and 3u of FFP on 2/18. She underwent left thigh AVG revision on 2/18 with heparin and coumadin resumed after procedure. Heparin level is at goal. INR is slightly below goal but has been increasing on her home dose of 4mg  daily. Hgb is stable at 8.9 (received 1 unit PRBC on 2/19) and platelets are low but stable at 69 (72 on admit). No active bleeding reported.  Goal of Therapy:  Heparin level 0.3-0.7 units/ml INR goal 2.5-3 (per anti-coag clinic) Monitor platelets by anticoagulation protocol: Yes  Plan:  1) Continue heparin at 1100 units/hr 2) Repeat coumadin 4mg  x 1 3) Follow up heparin level, INR, CBC in AM   Deboraha Sprang 04/03/2014,8:43 AM

## 2014-04-03 NOTE — Progress Notes (Signed)
PROGRESS NOTE  [name] BWL:[id number] DOB: [birth year] DOA: 03/30/2014 PCP: No primary care provider on file.   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 The patient finished a successful session of dialysis in [place]. There was difficulty with continued bleeding at her dialysis access site. An attempt was made to stop the bleeding by placing a stitch at [facility]. She continued to bleed. She was transferred to [facility], for further evaluation. The patient was seen by vascular surgery, Dr. Bridgett Larsson. She was taken to the OR for repair/revision of her left thigh AV graft on the evening of 03/30/2014. Assessment/Plan: Acute blood loss anemia -Baseline hemoglobin around 10 -Presented hemoglobin 6.7 -Patient transfused 2 units PRBC for the admission--> hemoglobin has remained stable after transfusion -Continue to monitor inpatient given the patient's comorbidities including continued necessity of warfarin -hesitant to send pt home with enoxaparin Bridge as the patient is ESRD which would increase risk of bleeding with enoxaparin  -Continue heparin drip as a bridge for warfarin  AV graft bleeding/degeneration -Appreciate Dr. Bridgett Larsson follow-up -tolerated HD after graft revision -03/30/2014 Status post left thigh AV graft repair -per vascular--stick only medial side of graft ESRD -Nephrology has been notified of the patient's maintenance dialysis -Patient dialyzes Tuesday, Thursday, Saturday in [place] Aortic stenosis status post aortic valve replacement (mechanical valve) -Patient received 4 units of FFP prior to surgical  intervention -Restarted Coumadin with heparin bridge -hesitant to send pt home with enoxaparin bridge as the patient is ESRD which would increase risk of bleeding with enoxaparin  -Monitor closely for re-bleed; monitor platelets Chronic systolic and diastolic CHF -68/12/5724 echo shows EF 55% -Appears euvolemic presently Hypertension -Continue metoprolol tartrate Status post liver transplant  -Continue tacrolimus  hypothyroidism -Continue Synthroid DM type 2 -SSI -A1C--6.7  CAD -continue ASA, BB  Family Communication: Pt at beside Disposition Plan: Home when INR 2-3    Procedures/Studies: Dg Chest Portable 1 View  03/07/2014   CLINICAL DATA:  Acute chest pain since yesterday.  EXAM: PORTABLE CHEST - 1 VIEW  COMPARISON:  February 19, 2014.  FINDINGS: Stable cardiomediastinal silhouette. Sternotomy wires are noted. No pneumothorax or significant pleural effusion is noted. No acute pulmonary disease is noted. Bony thorax appears intact.  IMPRESSION: No acute cardiopulmonary abnormality seen.   Electronically Signed   By: Sabino Dick M.D.   On: 03/07/2014 08:15         Subjective: Patient denies fevers, chills, headache, chest pain, dyspnea, nausea, vomiting, diarrhea, abdominal pain, dysuria, hematuria. Left thigh is feeling better with less pain. There is no active bleeding.   Objective: Filed Vitals:   04/02/14 1954 04/02/14 2146 04/03/14 0432 04/03/14 1414  BP: 112/45 135/50 152/37 116/44  Pulse: 66 67 71 65  Temp: 98.2 F (36.8 C)  98.6 F (37 C) 98 F (36.7 C)  TempSrc: Oral  Oral Oral  Resp: 17  18 17   Height:      Weight:   79.2 kg (174 lb 9.7 oz)   SpO2: 100%  99% 100%    Intake/Output Summary (Last 24 hours) at 04/03/14 1922 Last data filed at 04/03/14 1902  Gross per 24 hour  Intake    998 ml  Output      0 ml  Net    998 ml   Weight change: 2.1 kg (4 lb 10.1 oz) Exam:   General:  Pt is alert, follows commands appropriately, not in acute  distress  HEENT: No icterus, No thrush,  [place]/AT  Cardiovascular: RRR, S1/S2, no rubs, no gallops  Respiratory: CTA bilaterally, no wheezing, no crackles, no rhonchi  Abdomen: Soft/+BS, non tender, non distended, no guarding  Extremities: trac LE edema, No lymphangitis, No petechiae, No rashes, no synovitis; left thigh with palpable thrill. No induration, erythema, bleeding  Data Reviewed: Basic Metabolic Panel:  Recent Labs Lab 03/31/14 0036 03/31/14 0154 04/01/14 0230 04/02/14 0337 04/03/14 0425  NA 140 138 136 136 136  K 4.2 4.2 5.3* 4.4 4.6  CL 98 97 98 100 99  CO2 34* 34* 30 29 27   GLUCOSE 181* 177* 150* 103* 152*  BUN 14 16 35* 18 33*  CREATININE 4.64* 4.81* 7.46* 5.41* 7.74*  CALCIUM 7.8* 7.8* 7.8* 7.9* 7.9*  PHOS  --   --   --   --  3.8   Liver Function Tests:  Recent Labs Lab 03/31/14 0154 04/01/14 0230 04/02/14 0337 04/03/14 0425  AST 39* 28 33 31  ALT 24 9 6 7   ALKPHOS 71 76 73 75  BILITOT 0.7 0.7 0.7 0.5  PROT 5.9* 5.8* 5.7* 5.8*  ALBUMIN 2.8* 2.7* 2.6* 2.6*   No results for input(s): LIPASE, AMYLASE in the last 168 hours. No results for input(s): AMMONIA in the last 168 hours. CBC:  Recent Labs Lab 03/30/14 1636  03/31/14 0036 03/31/14 0154 03/31/14 0725 04/01/14 0230 04/02/14 0337 04/03/14 0425  WBC 5.1  --  4.0 4.4  --  4.4 5.1 4.7  NEUTROABS 3.1  --   --  2.8  --  2.6 2.8 2.5  HGB 9.0*  < > 6.7* 6.5* 8.7* 8.0* 8.9* 8.9*  HCT 28.1*  < > 20.9* 20.1* 26.5* 24.3* 27.5* 27.2*  MCV 99.3  --  101.5* 99.0  --  95.3 94.2 94.1  PLT 91*  --  72* 83*  --  66* 85* 69*  < > = values in this interval not displayed. Cardiac Enzymes: No results for input(s): CKTOTAL, CKMB, CKMBINDEX, TROPONINI in the last 168 hours. BNP: Invalid input(s): POCBNP CBG:  Recent Labs Lab 04/03/14 0007 04/03/14 0430 04/03/14 0641 04/03/14 1145 04/03/14 1626  GLUCAP 171* 154* 137* 186* 130*    No results found for this or any previous visit (from the past 240  hour(s)).   Scheduled Meds: . sodium chloride   Intravenous Once  . sodium chloride   Intravenous Once  . artificial tears  1 application Right Eye N0N  . aspirin EC  81 mg Oral Daily  . calcitRIOL  0.5 mcg Oral Q T,Th,Sa-HD  . calcium acetate  2,001 mg Oral TID WC  . cinacalcet  30 mg Oral QPC supper  . docusate sodium  100 mg Oral Daily  . dorzolamide-timolol  1 drop Right Eye Q4H  . gabapentin  300 mg Oral QHS  . insulin aspart  0-5 Units Subcutaneous QHS  . insulin aspart  0-9 Units Subcutaneous TID WC  . levothyroxine  150 mcg Oral QAC breakfast  . metoCLOPramide  10 mg Oral TID AC  . metoprolol tartrate  25 mg Oral BID  . multivitamin  1 tablet Oral QHS  . pantoprazole  40 mg Oral QAC breakfast  .  sodium chloride  3 mL Intravenous Q12H  . tacrolimus  3 mg Oral BID  . Warfarin - Pharmacist Dosing Inpatient   Does not apply q1800   Continuous Infusions: . heparin 1,100 Units/hr (04/02/14 2352)     Diego Ulbricht, DO  [facility] Pager [phone]  If 7PM-7AM, please contact night-coverage [email or web address] Password [email or web address] 04/03/2014, 7:22 PM   LOS: 4 days

## 2014-04-04 LAB — CBC WITH DIFFERENTIAL/PLATELET
BASOS ABS: 0 10*3/uL (ref 0.0–0.1)
BASOS PCT: 0 % (ref 0–1)
EOS ABS: 0.1 10*3/uL (ref 0.0–0.7)
Eosinophils Relative: 3 % (ref 0–5)
HEMATOCRIT: 26.1 % — AB (ref 36.0–46.0)
Hemoglobin: 8.4 g/dL — ABNORMAL LOW (ref 12.0–15.0)
Lymphocytes Relative: 30 % (ref 12–46)
Lymphs Abs: 1.3 10*3/uL (ref 0.7–4.0)
MCH: 31.1 pg (ref 26.0–34.0)
MCHC: 32.2 g/dL (ref 30.0–36.0)
MCV: 96.7 fL (ref 78.0–100.0)
MONO ABS: 0.6 10*3/uL (ref 0.1–1.0)
Monocytes Relative: 13 % — ABNORMAL HIGH (ref 3–12)
Neutro Abs: 2.4 10*3/uL (ref 1.7–7.7)
Neutrophils Relative %: 54 % (ref 43–77)
Platelets: 68 10*3/uL — ABNORMAL LOW (ref 150–400)
RBC: 2.7 MIL/uL — AB (ref 3.87–5.11)
RDW: 17.8 % — ABNORMAL HIGH (ref 11.5–15.5)
WBC: 4.4 10*3/uL (ref 4.0–10.5)

## 2014-04-04 LAB — RENAL FUNCTION PANEL
ANION GAP: 9 (ref 5–15)
Albumin: 2.6 g/dL — ABNORMAL LOW (ref 3.5–5.2)
BUN: 42 mg/dL — AB (ref 6–23)
CHLORIDE: 100 mmol/L (ref 96–112)
CO2: 26 mmol/L (ref 19–32)
Calcium: 7.4 mg/dL — ABNORMAL LOW (ref 8.4–10.5)
Creatinine, Ser: 9.85 mg/dL — ABNORMAL HIGH (ref 0.50–1.10)
GFR calc Af Amer: 4 mL/min — ABNORMAL LOW (ref >90)
GFR calc non Af Amer: 4 mL/min — ABNORMAL LOW (ref >90)
GLUCOSE: 117 mg/dL — AB (ref 70–99)
POTASSIUM: 4.3 mmol/L (ref 3.5–5.1)
Phosphorus: 4.1 mg/dL (ref 2.3–4.6)
Sodium: 135 mmol/L (ref 135–145)

## 2014-04-04 LAB — GLUCOSE, CAPILLARY
Glucose-Capillary: 115 mg/dL — ABNORMAL HIGH (ref 70–99)
Glucose-Capillary: 125 mg/dL — ABNORMAL HIGH (ref 70–99)

## 2014-04-04 LAB — PROTIME-INR
INR: 2.94 — AB (ref 0.00–1.49)
Prothrombin Time: 30.9 seconds — ABNORMAL HIGH (ref 11.6–15.2)

## 2014-04-04 LAB — HEPARIN LEVEL (UNFRACTIONATED): Heparin Unfractionated: 0.33 IU/mL (ref 0.30–0.70)

## 2014-04-04 MED ORDER — SODIUM CHLORIDE 0.9 % IV SOLN: 100.0000 mL | INTRAVENOUS | Status: DC | PRN

## 2014-04-04 MED ORDER — PENTAFLUOROPROP-TETRAFLUOROETH EX AERO: 1.0000 "application " | INHALATION_SPRAY | CUTANEOUS | Status: DC | PRN

## 2014-04-04 MED ORDER — LIDOCAINE-PRILOCAINE 2.5-2.5 % EX CREA
1.0000 "application " | TOPICAL_CREAM | CUTANEOUS | Status: DC | PRN
  Filled 2014-04-04: qty 5

## 2014-04-04 MED ORDER — LIDOCAINE HCL (PF) 1 % IJ SOLN: 5.0000 mL | INTRAMUSCULAR | Status: DC | PRN

## 2014-04-04 MED ORDER — ALTEPLASE 2 MG IJ SOLR
2.0000 mg | Freq: Once | INTRAMUSCULAR | Status: DC | PRN
Start: 2014-04-04 — End: 2014-04-04
  Filled 2014-04-04: qty 2

## 2014-04-04 MED ORDER — HEPARIN SODIUM (PORCINE) 1000 UNIT/ML DIALYSIS
1000.0000 [IU] | INTRAMUSCULAR | Status: DC | PRN
  Filled 2014-04-04: qty 1

## 2014-04-04 MED ORDER — NEPRO/CARBSTEADY PO LIQD
237.0000 mL | ORAL | Status: DC | PRN
  Filled 2014-04-04: qty 237

## 2014-04-04 MED ORDER — DARBEPOETIN ALFA 100 MCG/0.5ML IJ SOSY
PREFILLED_SYRINGE | INTRAMUSCULAR | Status: AC
Start: 2014-04-04 — End: 2014-04-04
  Administered 2014-04-04: 100 ug via INTRAVENOUS
  Filled 2014-04-04: qty 0.5

## 2014-04-04 MED ORDER — DARBEPOETIN ALFA 100 MCG/0.5ML IJ SOSY
100.0000 ug | PREFILLED_SYRINGE | Freq: Once | INTRAMUSCULAR | Status: AC
  Administered 2014-04-04: 100 ug via INTRAVENOUS

## 2014-04-04 NOTE — Progress Notes (Signed)
Subjective:  Seen on dialysis, no complaints, anticipating discharge  Objective: Vital signs in last 24 hours: Temp:  [98 F (36.7 C)-98.4 F (36.9 C)] 98.1 F (36.7 C) (02/23 0641) Pulse Rate:  [62-68] 62 (02/23 0702) Resp:  [14-18] 16 (02/23 0702) BP: (116-148)/(40-63) 148/63 mmHg (02/23 0702) SpO2:  [97 %-100 %] 97 % (02/23 0641) Weight:  [80.5 kg (177 lb 7.5 oz)-80.6 kg (177 lb 11.1 oz)] 80.5 kg (177 lb 7.5 oz) (02/23 0641) Weight change: 1.4 kg (3 lb 1.4 oz)  Intake/Output from previous day: 02/22 0701 - 02/23 0700 In: 776 [P.O.:776] Out: -  Intake/Output this shift:   Lab Results:  Recent Labs  04/03/14 0425 04/04/14 0503  WBC 4.7 4.4  HGB 8.9* 8.4*  HCT 27.2* 26.1*  PLT 69* 68*   BMET:  Recent Labs  04/02/14 0337 04/03/14 0425  NA 136 136  K 4.4 4.6  CL 100 99  CO2 29 27  GLUCOSE 103* 152*  BUN 18 33*  CREATININE 5.41* 7.74*  CALCIUM 7.9* 7.9*  ALBUMIN 2.6* 2.6*   No results for input(s): PTH in the last 72 hours. Iron Studies: No results for input(s): IRON, TIBC, TRANSFERRIN, FERRITIN in the last 72 hours.  Studies/Results: No results found.   Inpatient Medications: . sodium chloride   Intravenous Once  . sodium chloride   Intravenous Once  . artificial tears  1 application Right Eye U7O  . aspirin EC  81 mg Oral Daily  . calcitRIOL  0.5 mcg Oral Q T,Th,Sa-HD  . calcium acetate  2,001 mg Oral TID WC  . cinacalcet  30 mg Oral QPC supper  . docusate sodium  100 mg Oral Daily  . dorzolamide-timolol  1 drop Right Eye Q4H  . gabapentin  300 mg Oral QHS  . insulin aspart  0-5 Units Subcutaneous QHS  . insulin aspart  0-9 Units Subcutaneous TID WC  . levothyroxine  150 mcg Oral QAC breakfast  . metoCLOPramide  10 mg Oral TID AC  . metoprolol tartrate  25 mg Oral BID  . multivitamin  1 tablet Oral QHS  . pantoprazole  40 mg Oral QAC breakfast  . sodium chloride  3 mL Intravenous Q12H  . tacrolimus  3 mg Oral BID  . Warfarin - Pharmacist Dosing  Inpatient   Does not apply q1800   EXAM: General appearance:  Alert, in no apparent distress Resp:  CTA without rales, rhonchi, or wheezes Cardio:  RRR without murmur or rub GI:  + BS, soft and nontender Extremities:  No edema Access:  L thigh AVG with BFR 400,   HD: TTS [place] 3h 57min 400/A1.5 2/2.25 Ca Heparin none 76.5 kg  Mircera 75 mcg every 2 weeks Calcitriol 0.5 mcg TIW  Assessment/Plan: 1. Bleeding L thigh AVG - s/p revision with interposition graft 2/18 per Dr. Bridgett Larsson, cannulating only medial side. 2. ESRD - HD on TTS @ [facility], K 4.6.  HD today. 3. HTN/Volume - BP 148/63 on Metoprolol 25 mg bid, wt 80.5 kg with UF goal 4 L. 4. Anemia - Hgb 8.4, was to start OP Mircera 75 ug today, will give Aranesp 100 mcg today. 5. 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 6. Nutrition - Alb 2.6, renal carb-mod diet, vitamin. 7. Hx mechanical AVR - on Coumadin, Heparin. 8. Liver transplant - on Prograf 9. CAD - ASA, BB    LOS: 5 days  [name],[name] 04/04/2014,7:07 AM  Pt seen, examined, agree w assess/plan as above with additions as indicated.  [name] MD pager [phone]    cell [phone] 04/04/2014, 10:47 AM

## 2014-04-04 NOTE — Progress Notes (Signed)
Pt to d/c home today; pt states she is missing cell phone (cricket); pt stated she last used her cell phone on 2/18; pt states her cell phone battery has been dead and she has not had phone charger; pt states she has been using room phone.

## 2014-04-04 NOTE — Discharge Summary (Addendum)
Physician Discharge Summary  [name] WEX:[id number] DOB: May 02, 1955 DOA: 03/30/2014  PCP: No primary care provider on file.  Admit date: 03/30/2014 Discharge date: 04/04/2014  Recommendations for Outpatient Follow-up:  1. Pt will need to follow up with PCP in 2 weeks post discharge 2. Please obtain INR on 04/06/14 and adjust Coumadin accordingly  Discharge Diagnoses:  Acute blood loss anemia -Baseline hemoglobin around 10 -Presented hemoglobin 6.7 -Patient transfused 2 units PRBC for the admission--> hemoglobin has remained stable after transfusion -Continue to monitor inpatient given the patient's comorbidities including continued necessity of warfarin -hesitant to send pt home with enoxaparin Bridge as the patient is ESRD which would increase risk of bleeding with enoxaparin  -Continued heparin drip as a bridge for warfarin  -INR 2.9 for the day of discharge. As a result, heparin drip was discontinued-patient was instructed to restart her usual dose of Coumadin after discharge. AV graft bleeding/degeneration -Appreciate Dr. Bridgett Larsson follow-up -tolerated HD after graft revision -03/30/2014 Status post left thigh AV graft repair -per vascular--stick only medial side of graft ESRD -Nephrology has been notified of the patient's maintenance dialysis -Patient dialyzes Tuesday, Thursday, Saturday in [place] Aortic stenosis status post aortic valve replacement (mechanical valve) -Patient received 4 units of FFP prior to surgical intervention -Restarted Coumadin with heparin bridge -hesitant to send pt home with enoxaparin bridge as the patient is ESRD which would increase risk of bleeding with enoxaparin  -Monitor closely for re-bleed; monitor platelets-->remained stable without any bleeding complications -INR 9.38 and day of discharge.  Chronic systolic and diastolic CHF -11/26/5100 echo shows EF 55% -Appears euvolemic presently Hypertension -Continue metoprolol tartrate Status  post liver transplant  -Continue tacrolimus  hypothyroidism -Continue Synthroid DM type 2 -SSI -A1C--6.7  CAD -continue ASA, BB  Discharge Condition: Stable  Disposition:  Follow-up Information    Follow up with Hinda Lenis, MD In 2 weeks.   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 The patient finished a successful session of dialysis in [place]. There was difficulty with continued bleeding at her dialysis access site. An attempt was made to stop the bleeding by placing a stitch at [facility]. She continued to bleed. She was transferred to [facility], for further evaluation. The patient was seen by vascular surgery, Dr. Bridgett Larsson. She was taken to the OR for repair/revision of her left thigh AV graft on the evening of 03/30/2014.  The patient did not have any further bleeding complications. The patient was continued on heparin drip to chew her mechanical aortic valve. She was started back on warfarin. She was continued on heparin drip until her INR was therapeutic. On the day of discharge, INR was 2.94. The patient will be discharged to have repeat INR on 04/06/2014.  Consultants: Vascular surgery renal  Discharge Exam: Filed Vitals:   04/04/14 0916  BP: 127/64  Pulse: 61  Temp:   Resp: 13   Filed Vitals:   04/04/14  0756 04/04/14 0822 04/04/14 0848 04/04/14 0916  BP: 127/61 84/47 142/57 127/64  Pulse: 60 63 60 61  Temp:      TempSrc:      Resp: 18 12 12 13   Height:      Weight:      SpO2:       General: A&O x 3, NAD, pleasant, cooperative Cardiovascular: RRR, no rub, no gallop, no S3 Respiratory: CTAB, no wheeze, no rhonchi Abdomen:soft, nontender, nondistended, positive bowel sounds Extremities: No edema, No lymphangitis, no petechiae; right lower extremity without any bleeding, induration, erythema.  Discharge Instructions      Discharge Instructions    Diet - low sodium heart healthy    Complete by:  As directed      Increase activity slowly    Complete by:  As directed             Medication List    TAKE these medications        albuterol 90 MCG/ACT inhaler  Commonly known as:  PROVENTIL,VENTOLIN  Inhale 1-2 puffs into the lungs every 6 (six) hours as needed for wheezing or shortness of breath.     artificial tears Oint ophthalmic ointment  Place 1 application into the right eye every 4 (four) hours. Apply under right eye lid after administering eye drops     aspirin EC 81 MG tablet  Take 81 mg by mouth daily.     calcium acetate 667 MG capsule  Commonly known as:  PHOSLO  Take 667-2,001 mg by mouth See admin instructions. Take 3 capsules (2001 mg) with meals and 1 capsule (667 mg) with snacks     cinacalcet 30 MG tablet  Commonly known as:  SENSIPAR  Take 30 mg by mouth daily.     dorzolamide-timolol 22.3-6.8 MG/ML ophthalmic solution  Commonly known as:  COSOPT  Place 1 drop into the right eye every 4 (four) hours. Follow with artificial tears ointment under eye lid     gabapentin 300 MG capsule  Commonly known as:  NEURONTIN  Take 300 mg by mouth at bedtime.     insulin aspart 100 UNIT/ML injection  Commonly known as:  novoLOG  Inject 1-3 Units into the skin 3 (three) times daily as needed for high blood sugar (CBG>150). Per sliding scale     levothyroxine  150 MCG tablet  Commonly known as:  SYNTHROID, LEVOTHROID  Take 150 mcg by mouth daily before breakfast.     magnesium oxide 400 MG tablet  Commonly known as:  MAG-OX  Take 400 mg by mouth daily.     metoCLOPramide 10 MG tablet  Commonly known as:  REGLAN  Take 10 mg by mouth 3 (three) times daily before meals.     metoprolol tartrate 25 MG tablet  Commonly known as:  LOPRESSOR  Take 25 mg by mouth 2 (two) times daily.     nitroGLYCERIN 0.4 MG SL tablet  Commonly known as:  NITROSTAT  Place 1 tablet (0.4 mg total) under the tongue every 5 (five) minutes as needed for chest pain.     ondansetron 4 MG disintegrating tablet  Commonly known as:  ZOFRAN-ODT  Take 4 mg by mouth every 8 (eight) hours as needed for nausea or vomiting.     tacrolimus 1 MG capsule  Commonly known as:  PROGRAF  Take 3 mg by mouth 2 (two) times daily.  traMADol 50 MG tablet  Commonly known as:  ULTRAM  Take 1 tablet (50 mg total) by mouth every 12 (twelve) hours as needed for moderate pain.     warfarin 4 MG tablet  Commonly known as:  COUMADIN  Take 1 tablet (4 mg total) by mouth daily.         The results of significant diagnostics from this hospitalization (including imaging, microbiology, ancillary and laboratory) are listed below for reference.    Significant Diagnostic Studies: Dg Chest Portable 1 View  03/07/2014   CLINICAL DATA:  Acute chest pain since yesterday.  EXAM: PORTABLE CHEST - 1 VIEW  COMPARISON:  February 19, 2014.  FINDINGS: Stable cardiomediastinal silhouette. Sternotomy wires are noted. No pneumothorax or significant pleural effusion is noted. No acute pulmonary disease is noted. Bony thorax appears intact.  IMPRESSION: No acute cardiopulmonary abnormality seen.   Electronically Signed   By: Sabino Dick M.D.   On: 03/07/2014 08:15     Microbiology: No results found for this or any previous visit (from the past 240 hour(s)).   Labs: Basic Metabolic Panel:  Recent  Labs Lab 03/31/14 0154 04/01/14 0230 04/02/14 0337 04/03/14 0425 04/04/14 0659  NA 138 136 136 136 135  K 4.2 5.3* 4.4 4.6 4.3  CL 97 98 100 99 100  CO2 34* 30 29 27 26   GLUCOSE 177* 150* 103* 152* 117*  BUN 16 35* 18 33* 42*  CREATININE 4.81* 7.46* 5.41* 7.74* 9.85*  CALCIUM 7.8* 7.8* 7.9* 7.9* 7.4*  PHOS  --   --   --  3.8 4.1   Liver Function Tests:  Recent Labs Lab 03/31/14 0154 04/01/14 0230 04/02/14 0337 04/03/14 0425 04/04/14 0659  AST 39* 28 33 31  --   ALT 24 9 6 7   --   ALKPHOS 71 76 73 75  --   BILITOT 0.7 0.7 0.7 0.5  --   PROT 5.9* 5.8* 5.7* 5.8*  --   ALBUMIN 2.8* 2.7* 2.6* 2.6* 2.6*   No results for input(s): LIPASE, AMYLASE in the last 168 hours. No results for input(s): AMMONIA in the last 168 hours. CBC:  Recent Labs Lab 03/31/14 0154 03/31/14 0725 04/01/14 0230 04/02/14 0337 04/03/14 0425 04/04/14 0503  WBC 4.4  --  4.4 5.1 4.7 4.4  NEUTROABS 2.8  --  2.6 2.8 2.5 2.4  HGB 6.5* 8.7* 8.0* 8.9* 8.9* 8.4*  HCT 20.1* 26.5* 24.3* 27.5* 27.2* 26.1*  MCV 99.0  --  95.3 94.2 94.1 96.7  PLT 83*  --  66* 85* 69* 68*   Cardiac Enzymes: No results for input(s): CKTOTAL, CKMB, CKMBINDEX, TROPONINI in the last 168 hours. BNP: Invalid input(s): POCBNP CBG:  Recent Labs Lab 04/03/14 0641 04/03/14 1145 04/03/14 1626 04/03/14 2133 04/04/14 0607  GLUCAP 137* 186* 130* 129* 115*    Time coordinating discharge:  Greater than 30 minutes  Signed:  Gemini Bunte, DO [facility] Pager: [phone] 04/04/2014, 9:25 AM

## 2014-04-06 DIAGNOSIS — E118 Type 2 diabetes mellitus with unspecified complications: Secondary | ICD-10-CM | POA: Diagnosis not present

## 2014-04-06 DIAGNOSIS — N186 End stage renal disease: Secondary | ICD-10-CM | POA: Diagnosis not present

## 2014-04-06 DIAGNOSIS — D631 Anemia in chronic kidney disease: Secondary | ICD-10-CM | POA: Diagnosis not present

## 2014-04-06 DIAGNOSIS — I359 Nonrheumatic aortic valve disorder, unspecified: Secondary | ICD-10-CM | POA: Diagnosis not present

## 2014-04-06 DIAGNOSIS — N2581 Secondary hyperparathyroidism of renal origin: Secondary | ICD-10-CM | POA: Diagnosis not present

## 2014-04-06 LAB — PROTIME-INR
INR: 3.2 — AB (ref 0.9–1.1)
INR: 3.2 — AB (ref <=1.1)

## 2014-04-07 ENCOUNTER — Ambulatory Visit (INDEPENDENT_AMBULATORY_CARE_PROVIDER_SITE_OTHER): Payer: Medicare Other | Admitting: Pharmacist Clinician (PhC)/ Clinical Pharmacy Specialist

## 2014-04-07 DIAGNOSIS — Z7901 Long term (current) use of anticoagulants: Secondary | ICD-10-CM

## 2014-04-07 DIAGNOSIS — Z952 Presence of prosthetic heart valve: Secondary | ICD-10-CM

## 2014-04-07 DIAGNOSIS — Z954 Presence of other heart-valve replacement: Secondary | ICD-10-CM

## 2014-04-08 DIAGNOSIS — D631 Anemia in chronic kidney disease: Secondary | ICD-10-CM | POA: Diagnosis not present

## 2014-04-08 DIAGNOSIS — N2581 Secondary hyperparathyroidism of renal origin: Secondary | ICD-10-CM | POA: Diagnosis not present

## 2014-04-08 DIAGNOSIS — E118 Type 2 diabetes mellitus with unspecified complications: Secondary | ICD-10-CM | POA: Diagnosis not present

## 2014-04-08 DIAGNOSIS — N186 End stage renal disease: Secondary | ICD-10-CM | POA: Diagnosis not present

## 2014-04-10 ENCOUNTER — Ambulatory Visit (HOSPITAL_BASED_OUTPATIENT_CLINIC_OR_DEPARTMENT_OTHER): Payer: Medicare Other | Attending: Cardiovascular Disease

## 2014-04-10 DIAGNOSIS — N186 End stage renal disease: Secondary | ICD-10-CM | POA: Diagnosis not present

## 2014-04-10 DIAGNOSIS — Z992 Dependence on renal dialysis: Secondary | ICD-10-CM | POA: Diagnosis not present

## 2014-04-11 DIAGNOSIS — D631 Anemia in chronic kidney disease: Secondary | ICD-10-CM | POA: Diagnosis not present

## 2014-04-11 DIAGNOSIS — N186 End stage renal disease: Secondary | ICD-10-CM | POA: Diagnosis not present

## 2014-04-11 DIAGNOSIS — N2581 Secondary hyperparathyroidism of renal origin: Secondary | ICD-10-CM | POA: Diagnosis not present

## 2014-04-11 DIAGNOSIS — E118 Type 2 diabetes mellitus with unspecified complications: Secondary | ICD-10-CM | POA: Diagnosis not present

## 2014-04-11 DIAGNOSIS — I359 Nonrheumatic aortic valve disorder, unspecified: Secondary | ICD-10-CM | POA: Diagnosis not present

## 2014-04-11 LAB — PROTIME-INR: INR: 3.2 — AB (ref <=1.1)

## 2014-04-12 ENCOUNTER — Ambulatory Visit (INDEPENDENT_AMBULATORY_CARE_PROVIDER_SITE_OTHER): Payer: Medicare Other | Admitting: Pharmacist Clinician (PhC)/ Clinical Pharmacy Specialist

## 2014-04-12 DIAGNOSIS — Z954 Presence of other heart-valve replacement: Secondary | ICD-10-CM

## 2014-04-12 DIAGNOSIS — Z7901 Long term (current) use of anticoagulants: Secondary | ICD-10-CM

## 2014-04-12 DIAGNOSIS — Z952 Presence of prosthetic heart valve: Secondary | ICD-10-CM

## 2014-04-14 ENCOUNTER — Encounter (HOSPITAL_COMMUNITY): Payer: Self-pay

## 2014-04-14 ENCOUNTER — Telehealth: Payer: Self-pay | Admitting: Pharmacist Clinician (PhC)/ Clinical Pharmacy Specialist

## 2014-04-14 ENCOUNTER — Emergency Department (HOSPITAL_COMMUNITY)
Admission: EM | Admit: 2014-04-14 | Discharge: 2014-04-14 | Disposition: A | Discharge status: Home / Self Care | Payer: Medicare Other | Attending: Emergency Medicine | Admitting: Emergency Medicine

## 2014-04-14 DIAGNOSIS — Z7901 Long term (current) use of anticoagulants: Secondary | ICD-10-CM | POA: Diagnosis not present

## 2014-04-14 DIAGNOSIS — E669 Obesity, unspecified: Secondary | ICD-10-CM | POA: Insufficient documentation

## 2014-04-14 DIAGNOSIS — Z79899 Other long term (current) drug therapy: Secondary | ICD-10-CM | POA: Insufficient documentation

## 2014-04-14 DIAGNOSIS — T8189XA Other complications of procedures, not elsewhere classified, initial encounter: Secondary | ICD-10-CM | POA: Diagnosis not present

## 2014-04-14 DIAGNOSIS — E039 Hypothyroidism, unspecified: Secondary | ICD-10-CM | POA: Insufficient documentation

## 2014-04-14 DIAGNOSIS — N186 End stage renal disease: Secondary | ICD-10-CM | POA: Insufficient documentation

## 2014-04-14 DIAGNOSIS — T82838A Hemorrhage of vascular prosthetic devices, implants and grafts, initial encounter: Secondary | ICD-10-CM

## 2014-04-14 DIAGNOSIS — Z7982 Long term (current) use of aspirin: Secondary | ICD-10-CM | POA: Insufficient documentation

## 2014-04-14 DIAGNOSIS — G8929 Other chronic pain: Secondary | ICD-10-CM | POA: Insufficient documentation

## 2014-04-14 DIAGNOSIS — G43909 Migraine, unspecified, not intractable, without status migrainosus: Secondary | ICD-10-CM | POA: Diagnosis not present

## 2014-04-14 DIAGNOSIS — M1990 Unspecified osteoarthritis, unspecified site: Secondary | ICD-10-CM | POA: Diagnosis not present

## 2014-04-14 DIAGNOSIS — E119 Type 2 diabetes mellitus without complications: Secondary | ICD-10-CM | POA: Diagnosis not present

## 2014-04-14 DIAGNOSIS — J449 Chronic obstructive pulmonary disease, unspecified: Secondary | ICD-10-CM | POA: Insufficient documentation

## 2014-04-14 DIAGNOSIS — I120 Hypertensive chronic kidney disease with stage 5 chronic kidney disease or end stage renal disease: Secondary | ICD-10-CM | POA: Diagnosis not present

## 2014-04-14 DIAGNOSIS — Z992 Dependence on renal dialysis: Secondary | ICD-10-CM | POA: Diagnosis not present

## 2014-04-14 DIAGNOSIS — Z9889 Other specified postprocedural states: Secondary | ICD-10-CM | POA: Diagnosis not present

## 2014-04-14 DIAGNOSIS — Z8701 Personal history of pneumonia (recurrent): Secondary | ICD-10-CM | POA: Insufficient documentation

## 2014-04-14 DIAGNOSIS — F329 Major depressive disorder, single episode, unspecified: Secondary | ICD-10-CM | POA: Diagnosis not present

## 2014-04-14 DIAGNOSIS — T82898A Other specified complication of vascular prosthetic devices, implants and grafts, initial encounter: Secondary | ICD-10-CM | POA: Diagnosis not present

## 2014-04-14 DIAGNOSIS — K5900 Constipation, unspecified: Secondary | ICD-10-CM | POA: Insufficient documentation

## 2014-04-14 DIAGNOSIS — Z720 Tobacco use: Secondary | ICD-10-CM | POA: Insufficient documentation

## 2014-04-14 DIAGNOSIS — I2510 Atherosclerotic heart disease of native coronary artery without angina pectoris: Secondary | ICD-10-CM | POA: Insufficient documentation

## 2014-04-14 DIAGNOSIS — Z8619 Personal history of other infectious and parasitic diseases: Secondary | ICD-10-CM | POA: Insufficient documentation

## 2014-04-14 DIAGNOSIS — K219 Gastro-esophageal reflux disease without esophagitis: Secondary | ICD-10-CM | POA: Insufficient documentation

## 2014-04-14 DIAGNOSIS — R011 Cardiac murmur, unspecified: Secondary | ICD-10-CM | POA: Insufficient documentation

## 2014-04-14 DIAGNOSIS — Y841 Kidney dialysis as the cause of abnormal reaction of the patient, or of later complication, without mention of misadventure at the time of the procedure: Secondary | ICD-10-CM | POA: Insufficient documentation

## 2014-04-14 DIAGNOSIS — Z794 Long term (current) use of insulin: Secondary | ICD-10-CM | POA: Insufficient documentation

## 2014-04-14 DIAGNOSIS — F419 Anxiety disorder, unspecified: Secondary | ICD-10-CM | POA: Diagnosis not present

## 2014-04-14 DIAGNOSIS — D649 Anemia, unspecified: Secondary | ICD-10-CM | POA: Diagnosis not present

## 2014-04-14 LAB — PROTIME-INR
INR: 4.19 — AB (ref 0.00–1.49)
Prothrombin Time: 40.7 seconds — ABNORMAL HIGH (ref 11.6–15.2)

## 2014-04-14 MED ORDER — DESMOPRESSIN ACETATE 4 MCG/ML IJ SOLN: 20.0000 ug | Freq: Once | INTRAMUSCULAR | Status: DC

## 2014-04-14 MED ORDER — DESMOPRESSIN ACETATE 1.5 MG/ML NA SOLN: 2.0000 | Freq: Once | NASAL | Status: DC

## 2014-04-14 MED ORDER — DESMOPRESSIN ACETATE 4 MCG/ML IJ SOLN
10.0000 ug | Freq: Once | INTRAMUSCULAR | Status: AC
  Administered 2014-04-14: 10 ug via SUBCUTANEOUS
  Filled 2014-04-14: qty 2.5

## 2014-04-14 MED ORDER — DESMOPRESSIN ACETATE 4 MCG/ML IJ SOLN
10.0000 ug | Freq: Once | INTRAMUSCULAR | Status: DC
  Filled 2014-04-14: qty 2.5

## 2014-04-14 MED ORDER — SODIUM CHLORIDE 0.9 % IV SOLN
20.0000 ug | Freq: Once | INTRAVENOUS | Status: DC
  Filled 2014-04-14: qty 5

## 2014-04-14 NOTE — ED Notes (Signed)
Quick clot and pressure dressing applied to graft of left groin.  Bleeding controlled at this time.

## 2014-04-14 NOTE — Telephone Encounter (Signed)
Dtr called stated took mom to ER again for excessive bleeding from fistula site.  INR at ER was 4.1.    Advised dtr to hold warfarin x 2 days (yesterday and today), then resume previous dose, will continue with weekly INR checks on Wednesdays at dialysis

## 2014-04-14 NOTE — ED Provider Notes (Signed)
CSN: [id number]     Arrival date & time 04/14/14  0122 History  This chart was scribed for [name] Balls, MD by Molli Posey, ED Scribe. This patient was seen in room A08C/A08C and the patient's care was started 1:30 AM.     Chief Complaint  Patient presents with  . Vascular Access Problem   The history is provided by the patient. No language interpreter was used.   HPI Comments: [name] is a 59 y.o. female with a history of S/P liver transplant, CAD, end stage renal disease on dialysis and S/P aortic valve replacement who presents to the Emergency Department complaining of vascular access bleeding on her left thigh that started at [facility] yesterday. Pt states this has been occuring her last few dialysis treatments with the most recent episode being 2 weeks ago. Pt states she had it stitched and a partial graft at that time. She reports she is on coumadin for her aortic valve replacement but states she has not taken it today. Pt states the last time she had her INR was 3.5 four days ago. Pt reports that she is in no pain at this time.   Past Medical History  Diagnosis Date  . 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 Chronic back pain   . CAD (coronary artery disease)   . Obesity   . Peripheral vascular disease hands and legs  . Anxiety   . Asthma   . GERD (gastroesophageal reflux disease)     takes Omeprazole daily  . Chronic constipation     takes MIralax and Colace daily  . Anemia     takes Folic Acid daily  . Hypothyroidism     takes Synthroid daily  . Depression     takes Cymbalta for "severe" depression  . Neuromuscular disorder     carpal tunnel in right hand  . Hypertension     takes Metoprolol and Lisinopril daily, sees Dr Bea Graff  . COPD (chronic obstructive pulmonary disease)   . Pneumonia     "today and several times before" (08/30/2012)  . Chronic bronchitis     "q yr w/season changes"  (08/30/2012)  . Type II diabetes mellitus     Levemir 2units daily if > 150  . History of blood transfusion     "several" (08/30/2012)  . Hepatitis C   . Migraine     "last migraine was in 2013" (08/30/2012)  . Headache     "at least monthly" (08/30/2012)  . Arthritis     "left hand, back" (08/30/2012)  . End stage renal disease on dialysis 02/27/2011    Kidneys shut down at time of liver transplant in Sept 2011 at [facility] in [place], she has been on HD ever since.  Dialyzes at [facility] HD on TTS schedule.  Had L forearm graft used 10 months then removed Dec 2012 due to suspected infection.  A right upper arm AVG was placed Dec 2012 but she developed steal symptoms acutely and it was ligated the same day.  Never had an AV fistula due to small veins.  Now has L thigh AVG put in Jan 2013, has not clotted to date.    [name] CAD (coronary artery disease) Jan. 2015    Cath: 20% LAD, 50% D1; s/p LIMA-LAD  . Aortic stenosis   . Tobacco abuse   . S/P aortic valve  replacement 03/15/13    Mechanical    Past Surgical History  Procedure Laterality Date  . Liver transplant  10/25/2009    sees Dr Ferol Luz 1 every 6 months, saw last in Dec 2013. [name] Coord [phone]  . Small intestine surgery  90's  . Thrombectomy    . Arteriovenous graft placement Left 10/03/10     forearm  . Avgg removal  12/23/2010    Procedure: REMOVAL OF ARTERIOVENOUS GORETEX GRAFT ([place]);  Surgeon: Elam Dutch, MD;  Location: [place];  Service: Vascular;  Laterality: Left;  procedure started @1736 -1852  . Insertion of dialysis catheter  12/23/2010    Procedure: INSERTION OF DIALYSIS CATHETER;  Surgeon: Elam Dutch, MD;  Location: [place];  Service: Vascular;  Laterality: Right;  Right Internal Jugular 28cm dialysis catheter insertion procedure time 1701-1720   . Cholecystectomy  1993  . Cystoscopy  1990's  . Spinal growth rods  2010    "put 2 metal rods in my back; they had detetriorated" (08/30/2012)  . Av fistula  placement  01/29/2011    Procedure: INSERTION OF ARTERIOVENOUS (AV) GORE-TEX GRAFT ARM;  Surgeon: Elam Dutch, MD;  Location: [place];  Service: Vascular;  Laterality: Right;  . Av fistula placement  03/10/2011    Procedure: INSERTION OF ARTERIOVENOUS (AV) GORE-TEX GRAFT THIGH;  Surgeon: Elam Dutch, MD;  Location: [place];  Service: Vascular;  Laterality: Left;  . Tubal ligation  1990's  . Aortic valve replacement N/A 03/15/2013    AVR; Surgeon: Ivin Poot, MD;  Location: [facility] OR; Open Heart Surgery;  69mm[employer] mechanical prosthesis, top hat valve  . Coronary artery bypass graft N/A 03/15/2013    Procedure: CORONARY ARTERY BYPASS GRAFTING (CABG) times one using left internal mammary artery.;  Surgeon: Ivin Poot, MD;  Location: [place];  Service: Open Heart Surgery;  Laterality: N/A;  POSS CABG X 1  . Intraoperative transesophageal echocardiogram N/A 03/15/2013    Procedure: INTRAOPERATIVE TRANSESOPHAGEAL ECHOCARDIOGRAM;  Surgeon: Ivin Poot, MD;  Location: [place];  Service: Open Heart Surgery;  Laterality: N/A;  . Left heart catheterization with coronary/graft angiogram N/A 12/24/2011    Procedure: LEFT HEART CATHETERIZATION WITH Beatrix Fetters;  Surgeon: [name], MD;  Location: [facility] CATH LAB;  Service: Cardiovascular;  Laterality: N/A;  . Left heart catheterization with coronary angiogram N/A 07/29/2012    Procedure: LEFT HEART CATHETERIZATION WITH CORONARY ANGIOGRAM;  Surgeon: Troy Sine, MD;  Location: [facility] CATH LAB;  Service: Cardiovascular;  Laterality: N/A;  . Left heart catheterization with coronary angiogram N/A 03/10/2013    Procedure: LEFT HEART CATHETERIZATION WITH CORONARY ANGIOGRAM;  Surgeon: Troy Sine, MD;  Location: [facility] CATH LAB;  Service: Cardiovascular;  Laterality: N/A;  . Left heart catheterization with coronary/graft angiogram N/A 12/16/2013    Procedure: LEFT HEART CATHETERIZATION WITH Beatrix Fetters;  Surgeon: Troy Sine, MD;   Location: [facility] CATH LAB;  Service: Cardiovascular;  Laterality: N/A;  . Thrombectomy and revision of arterioventous (av) goretex  graft Left 03/30/2014  . Thrombectomy and revision of arterioventous (av) goretex  graft Left 03/30/2014    Procedure: THROMBECTOMY AND REVISION OF ARTERIOVENTOUS (AV) GORETEX  GRAFT;  Surgeon: Conrad Tekamah, MD;  Location: [facility] OR;  Service: Vascular;  Laterality: Left;   Family History  Problem Relation Age of Onset  . Cancer Mother   . Diabetes Mother   . Hypertension Mother   . Stroke Mother   . Cancer Father   .  Anesthesia problems Neg Hx   . Hypotension Neg Hx   . Malignant hyperthermia Neg Hx   . Pseudochol deficiency Neg Hx    History  Substance Use Topics  . Smoking status: Current Some Day Smoker -- 0.75 packs/day for 40 years    Types: Cigarettes    Last Attempt to Quit: 03/13/2013  . Smokeless tobacco: Never Used     Comment: still smokng 1 or 2 a day  . Alcohol Use: Yes     Comment: 08/30/2012 "last drink was at a wedding July, 2014; had a small glass of wine; never had problems w/alcohol"   OB History    No data available     Review of Systems  Skin: Positive for wound.  All other systems reviewed and are negative.   Allergies  Acetaminophen; Codeine; Mirtazapine; and Morphine  Home Medications   Prior to Admission medications   Medication Sig Start Date End Date Taking? Authorizing Provider  albuterol (PROVENTIL,VENTOLIN) 90 MCG/ACT inhaler Inhale 1-2 puffs into the lungs every 6 (six) hours as needed for wheezing or shortness of breath.     Historical Provider, MD  artificial tears (LACRILUBE) OINT ophthalmic ointment Place 1 application into the right eye every 4 (four) hours. Apply under right eye lid after administering eye drops    Historical Provider, MD  aspirin EC 81 MG tablet Take 81 mg by mouth daily.      Historical Provider, MD  calcium acetate (PHOSLO) 667 MG capsule Take 667-2,001 mg by mouth See admin instructions. Take 3  capsules (2001 mg) with meals and 1 capsule (667 mg) with snacks    Historical Provider, MD  cinacalcet (SENSIPAR) 30 MG tablet Take 30 mg by mouth daily.    Historical Provider, MD  dorzolamide-timolol (COSOPT) 22.3-6.8 MG/ML ophthalmic solution Place 1 drop into the right eye every 4 (four) hours. Follow with artificial tears ointment under eye lid 03/16/14   Historical Provider, MD  gabapentin (NEURONTIN) 300 MG capsule Take 300 mg by mouth at bedtime.     Historical Provider, MD  insulin aspart (NOVOLOG) 100 UNIT/ML injection Inject 1-3 Units into the skin 3 (three) times daily as needed for high blood sugar (CBG>150). Per sliding scale    Historical Provider, MD  levothyroxine (SYNTHROID, LEVOTHROID) 150 MCG tablet Take 150 mcg by mouth daily before breakfast.    Historical Provider, MD  magnesium oxide (MAG-OX) 400 MG tablet Take 400 mg by mouth daily.    Historical Provider, MD  metoCLOPramide (REGLAN) 10 MG tablet Take 10 mg by mouth 3 (three) times daily before meals.    Historical Provider, MD  metoprolol tartrate (LOPRESSOR) 25 MG tablet Take 25 mg by mouth 2 (two) times daily.  03/16/14   Historical Provider, MD  nitroGLYCERIN (NITROSTAT) 0.4 MG SL tablet Place 1 tablet (0.4 mg total) under the tongue every 5 (five) minutes as needed for chest pain. 03/20/14   Phillips Climes, MD  ondansetron (ZOFRAN-ODT) 4 MG disintegrating tablet Take 4 mg by mouth every 8 (eight) hours as needed for nausea or vomiting.    Historical Provider, MD  tacrolimus (PROGRAF) 1 MG capsule Take 3 mg by mouth 2 (two) times daily.     Historical Provider, MD  traMADol (ULTRAM) 50 MG tablet Take 1 tablet (50 mg total) by mouth every 12 (twelve) hours as needed for moderate pain. 03/20/14   Phillips Climes, MD  warfarin (COUMADIN) 4 MG tablet Take 1 tablet (4 mg total) by mouth daily. Patient  taking differently: Take 4 mg by mouth daily at 6 PM.  02/24/14   Osa Craver, MD   SpO2 98% Physical Exam  Constitutional:  She is oriented to person, place, and time. She appears well-developed and well-nourished. No distress.  HENT:  Head: Normocephalic and atraumatic.  Nose: Nose normal.  Mouth/Throat: Oropharynx is clear and moist. No oropharyngeal exudate.  Eyes: Conjunctivae and EOM are normal. Pupils are equal, round, and reactive to light. No scleral icterus.  Neck: Normal range of motion. Neck supple. No JVD present. No tracheal deviation present. No thyromegaly present.  Cardiovascular: Normal rate and regular rhythm.  Exam reveals no gallop and no friction rub.   Murmur heard. Pulmonary/Chest: Effort normal and breath sounds normal. No respiratory distress. She has no wheezes. She exhibits no tenderness.  Abdominal: Soft. Bowel sounds are normal. She exhibits no distension and no mass. There is no tenderness. There is no rebound and no guarding.  Musculoskeletal: Normal range of motion. She exhibits no edema or tenderness.  Midline chest scar. Bilateral upper extremity AV fistula with no thrill palpated. Left lower extremity with palpable thrill and small amount of oozing blood.   Lymphadenopathy:    She has no cervical adenopathy.  Neurological: She is alert and oriented to person, place, and time. No cranial nerve deficit. She exhibits normal muscle tone.  Skin: Skin is warm and dry. No rash noted. No erythema. No pallor.  Nursing note and vitals reviewed.   ED Course  Procedures  DIAGNOSTIC STUDIES: Oxygen Saturation is 98% on RA, normal by my interpretation.    COORDINATION OF CARE: 1:41 AM Discussed treatment plan with pt at bedside and pt agreed to plan.   Labs Review Labs Reviewed  PROTIME-INR - Abnormal; Notable for the following:    Prothrombin Time 40.7 (*)    INR 4.19 (*)    All other components within normal limits    Imaging Review No results found.   EKG Interpretation None      MDM   Final diagnoses:  None  Patient presents for bleeding LLE AVF.  She states this  is beginning to occur more and more often after dialysis.  She recent had a vascular surgery in that area for correction and will be getting a new fistula in her RLE soon.  I held significant pressure for approx. 72min and the bleeding stopped.  RN applied combat gauze with pressure dressing to DC the patient home with.  Since she is dialysis she likely has dysfunctional platelets, she was given subcut DDAVP for treatment.  Also INR is too high at 4.19.  She has a valve replacement so target range is 2.5-3.5.  Patient was instructed to hold tonights dose and resume again tomorrow evening.  She will have dialysis again tomorrow (friday) and also will have her INR checked early next week.  She was educated on the importance of both of these appointments.  Her vital signs remain within her normal limits and she is safe for DC.  I personally performed the services described in this documentation, which was scribed in my presence. The recorded information has been reviewed and is accurate.      [name] Balls, MD 04/14/14 1535

## 2014-04-14 NOTE — ED Notes (Signed)
Pt had dialysis today and reports that her access to the left groin has been oozing since.  Pt had recent surgery to fix graft on 03/30/14.  Pt c/o no pain

## 2014-04-14 NOTE — ED Notes (Signed)
Bleeding remains controlled at graft site.  Pressure dressing remains in place.

## 2014-04-14 NOTE — Discharge Instructions (Signed)
Dialysis Vascular Access Malfunction Ms. Borrayo, where the pressure dressing for 24 hours before removing. Follow-up with your vascular surgeon for continued management within 3 days. If symptoms worsen come back to the emergency department immediately. Thank you. A vascular access is an entrance to your blood vessels that can be used for dialysis. A vascular access can be made in one of several ways:   Joining an artery to a vein under your skin to make a bigger blood vessel called a fistula.   Joining an artery to a vein under your skin using a soft tube called a graft.   Placing a thin, flexible tube (catheter) in a large vein, usually in your neck.  A vascular access may malfunction or become blocked.  WHAT CAN CAUSE YOUR VASCULAR ACCESS TO MALFUNCTION?  Infection (common).   A blood clot inside a part of the fistula, graft, or catheter. A blood clot can completely or partially block the flow of blood.   A kink in the graft or catheter.   A collection of blood (called a hematoma or bruise) next to the graft or catheter that pushes against it, blocking the flow of blood.  WHAT ARE SIGNS AND SYMPTOMS OF VASCULAR ACCESS MALFUNCTION?  There is a change in the vibration or pulse of your fistula or graft.  The vibration or pulse of your fistula or graft is gone.   There is new or unusual swelling of the area around the access.   There was an unsuccessful puncture of your access by the dialysis team.   The flow of blood through the fistula, graft, or catheter is too slow for effective dialysis.   When routine dialysis is completed and the needle is removed, bleeding lasts for too long a time.  WHAT HAPPENS IF MY VASCULAR ACCESS MALFUNCTIONS? Your health care provider may order blood work, cultures, or an X-ray test in order to learn what may be wrong with your vascular access. The X-ray test involves the injection of a liquid into the vascular access. The liquid shows up on  the X-ray and allows your health care provider to see if there is a blockage in the vascular access.  Treatment varies depending on the cause of the malfunction:   If the vascular access is infected, your health care provider may prescribe antibiotic medicine to control the infection.   If a clot is found in the vascular access, you may need surgery to remove the clot.   If a blockage in the vascular access is due to some other cause (such as a kink in a graft), then you will likely need surgery to unblock or replace the graft.  HOME CARE INSTRUCTIONS: Follow up with your surgeon or other health care provider if you were instructed to do so. This is very important. Any delay in follow-up could cause permanent dysfunction of the vascular access, which may be dangerous.  SEEK MEDICAL CARE IF:   Fever develops.   Swelling and pain around the vascular access gets worse or new pain develops.  Pain, numbness, or an unusual pale skin color develops in the hand on the side of your vascular access. SEEK IMMEDIATE MEDICAL CARE IF: Unusual bleeding develops at the location of the vascular access. MAKE SURE YOU:  Understand these instructions.  Will watch your condition.  Will get help right away if you are not doing well or get worse. Document Released: 12/30/2005 Document Revised: 06/13/2013 Document Reviewed: 07/01/2012 [facility] Patient Information 2015 [place], [place]. This  information is not intended to replace advice given to you by your health care provider. Make sure you discuss any questions you have with your health care provider.

## 2014-04-18 DIAGNOSIS — I359 Nonrheumatic aortic valve disorder, unspecified: Secondary | ICD-10-CM | POA: Diagnosis not present

## 2014-04-18 LAB — PROTIME-INR: INR: 2.1 — AB (ref <=1.1)

## 2014-04-20 ENCOUNTER — Ambulatory Visit (INDEPENDENT_AMBULATORY_CARE_PROVIDER_SITE_OTHER): Payer: Medicare Other | Admitting: Pharmacist Clinician (PhC)/ Clinical Pharmacy Specialist

## 2014-04-20 DIAGNOSIS — Z7901 Long term (current) use of anticoagulants: Secondary | ICD-10-CM

## 2014-04-20 DIAGNOSIS — H524 Presbyopia: Secondary | ICD-10-CM | POA: Diagnosis not present

## 2014-04-20 DIAGNOSIS — H0520 Unspecified exophthalmos: Secondary | ICD-10-CM | POA: Diagnosis not present

## 2014-04-20 DIAGNOSIS — Z952 Presence of prosthetic heart valve: Secondary | ICD-10-CM

## 2014-04-20 DIAGNOSIS — Z954 Presence of other heart-valve replacement: Secondary | ICD-10-CM

## 2014-04-20 DIAGNOSIS — H2513 Age-related nuclear cataract, bilateral: Secondary | ICD-10-CM | POA: Diagnosis not present

## 2014-04-24 ENCOUNTER — Encounter: Payer: Medicare Other | Admitting: Surgery

## 2014-04-25 DIAGNOSIS — I359 Nonrheumatic aortic valve disorder, unspecified: Secondary | ICD-10-CM | POA: Diagnosis not present

## 2014-04-25 LAB — PROTIME-INR
INR: 2.5 — AB (ref 0.9–1.1)
INR: 2.5 — AB (ref <=1.1)

## 2014-04-26 ENCOUNTER — Encounter: Payer: Medicare Other | Admitting: Vascular Surgery

## 2014-04-28 ENCOUNTER — Encounter: Payer: Self-pay | Admitting: Surgery

## 2014-04-28 ENCOUNTER — Ambulatory Visit (INDEPENDENT_AMBULATORY_CARE_PROVIDER_SITE_OTHER): Payer: Medicare Other | Admitting: Pharmacist Clinician (PhC)/ Clinical Pharmacy Specialist

## 2014-04-28 DIAGNOSIS — Z954 Presence of other heart-valve replacement: Secondary | ICD-10-CM

## 2014-04-28 DIAGNOSIS — Z7901 Long term (current) use of anticoagulants: Secondary | ICD-10-CM

## 2014-04-28 DIAGNOSIS — Z952 Presence of prosthetic heart valve: Secondary | ICD-10-CM

## 2014-05-01 ENCOUNTER — Ambulatory Visit (INDEPENDENT_AMBULATORY_CARE_PROVIDER_SITE_OTHER): Payer: Self-pay | Admitting: Surgery

## 2014-05-01 ENCOUNTER — Telehealth: Payer: Self-pay | Admitting: Cardiovascular Disease

## 2014-05-01 ENCOUNTER — Ambulatory Visit (INDEPENDENT_AMBULATORY_CARE_PROVIDER_SITE_OTHER): Payer: Medicare Other | Admitting: Cardiovascular Disease

## 2014-05-01 ENCOUNTER — Other Ambulatory Visit: Payer: Self-pay

## 2014-05-01 ENCOUNTER — Encounter: Payer: Self-pay | Admitting: Surgery

## 2014-05-01 VITALS — BP 160/72 | HR 69 | Ht 64.5 in | Wt 179.3 lb

## 2014-05-01 VITALS — BP 178/51 | HR 69 | Ht 64.5 in | Wt 179.0 lb

## 2014-05-01 DIAGNOSIS — E785 Hyperlipidemia, unspecified: Secondary | ICD-10-CM

## 2014-05-01 DIAGNOSIS — E119 Type 2 diabetes mellitus without complications: Secondary | ICD-10-CM | POA: Diagnosis not present

## 2014-05-01 DIAGNOSIS — I25708 Atherosclerosis of coronary artery bypass graft(s), unspecified, with other forms of angina pectoris: Secondary | ICD-10-CM

## 2014-05-01 DIAGNOSIS — Z992 Dependence on renal dialysis: Secondary | ICD-10-CM

## 2014-05-01 DIAGNOSIS — N186 End stage renal disease: Secondary | ICD-10-CM

## 2014-05-01 DIAGNOSIS — R0683 Snoring: Secondary | ICD-10-CM

## 2014-05-01 DIAGNOSIS — Z954 Presence of other heart-valve replacement: Secondary | ICD-10-CM

## 2014-05-01 DIAGNOSIS — Z951 Presence of aortocoronary bypass graft: Secondary | ICD-10-CM | POA: Diagnosis not present

## 2014-05-01 DIAGNOSIS — Z952 Presence of prosthetic heart valve: Secondary | ICD-10-CM

## 2014-05-01 DIAGNOSIS — G4730 Sleep apnea, unspecified: Secondary | ICD-10-CM

## 2014-05-01 DIAGNOSIS — R0681 Apnea, not elsewhere classified: Secondary | ICD-10-CM

## 2014-05-01 MED ORDER — AMLODIPINE BESYLATE 2.5 MG PO TABS: ORAL_TABLET | ORAL | Status: DC

## 2014-05-01 NOTE — Patient Instructions (Signed)
Your physician has recommended you make the following change in your medication:start new prescription for amlodipine 2.5 mg daily on non-dialysis days. This has already been sent to your pharmacy.  Your physician has recommended that you have a sleep study. This test records several body functions during sleep, including: brain activity, eye movement, oxygen and carbon dioxide blood levels, heart rate and rhythm, breathing rate and rhythm, the flow of air through your mouth and nose, snoring, body muscle movements, and chest and belly movement.   Your physician wants you to follow-up in: 3-4 months. Sooner if needed.

## 2014-05-01 NOTE — Telephone Encounter (Signed)
Colletta [place] is calling in to see if it is ok for the pt to stop her coumadin 5 days before a procedure they have scheduled for her on 4/5. Please f/u  Thanks

## 2014-05-01 NOTE — Progress Notes (Signed)
Filed Vitals:   05/01/14 1550 05/01/14 1630  BP: 161/85 178/51  Pulse: 69 69  Height: 5' 4.5" (1.638 m)   Weight: 179 lb (81.194 kg)   SpO2: 100%   Body mass index is 30.26 kg/(m^2).

## 2014-05-01 NOTE — Telephone Encounter (Signed)
Message sent to Dr.Kelly for advice. 

## 2014-05-01 NOTE — Progress Notes (Signed)
Patient name: [name] MRN: [id number] DOB: [birth year] Sex: female     Chief Complaint  Patient presents with  . Re-evaluation    s/p l thigh AVG repair    HISTORY OF PRESENT ILLNESS: This is a 59 year old female who presented to the emergency department on 03/30/2014 with acute bleeding from her left thigh dialysis graft.  She was taken to the operating room by Dr. Wendie Simmer her left thigh graft with an interposition graft.  He had difficulty passing a Fogarty across the venous anastomosis and recommended a shuntogram if the patient continues to have difficulty.  The patient states that she has active bleeding after each dialysis.  Past Medical History  Diagnosis Date  . S/P liver transplant     2011 at 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 Chronic back pain   . CAD (coronary artery disease)   . Obesity   . Peripheral vascular disease hands and legs  . Anxiety   . Asthma   . GERD (gastroesophageal reflux disease)     takes Omeprazole daily  . Chronic constipation     takes MIralax and Colace daily  . Anemia     takes Folic Acid daily  . Hypothyroidism     takes Synthroid daily  . Depression     takes Cymbalta for "severe" depression  . Neuromuscular disorder     carpal tunnel in right hand  . Hypertension     takes Metoprolol and Lisinopril daily, sees Dr Bea Graff  . COPD (chronic obstructive pulmonary disease)   . Pneumonia     "today and several times before" (08/30/2012)  . Chronic bronchitis     "q yr w/season changes" (08/30/2012)  . Type II diabetes mellitus     Levemir 2units daily if > 150  . History of blood transfusion     "several" (08/30/2012)  . Hepatitis C   . Migraine     "last migraine was in 2013" (08/30/2012)  . Headache     "at least monthly" (08/30/2012)  . Arthritis     "left hand, back" (08/30/2012)  . End stage renal disease on dialysis 02/27/2011    Kidneys shut down at time of liver transplant  in Sept 2011 at [facility] in [place], she has been on HD ever since.  Dialyzes at [facility] HD on TTS schedule.  Had L forearm graft used 10 months then removed Dec 2012 due to suspected infection.  A right upper arm AVG was placed Dec 2012 but she developed steal symptoms acutely and it was ligated the same day.  Never had an AV fistula due to small veins.  Now has L thigh AVG put in Jan 2013, has not clotted to date.    [name] CAD (coronary artery disease) Jan. 2015    Cath: 20% LAD, 50% D1; s/p LIMA-LAD  . Aortic stenosis   . Tobacco abuse   . S/P aortic valve replacement 03/15/13    Mechanical     Past Surgical History  Procedure Laterality Date  . Liver transplant  10/25/2009    sees Dr Ferol Luz 1 every 6 months, saw last in Dec 2013. [name] Coord [phone]  . Small intestine surgery  90's  . Thrombectomy    . Arteriovenous graft placement Left 10/03/10     forearm  . Avgg removal  12/23/2010    Procedure: REMOVAL OF ARTERIOVENOUS GORETEX GRAFT ([place]);  Surgeon:  Elam Dutch, MD;  Location: [place];  Service: Vascular;  Laterality: Left;  procedure started @1736 -2263  . Insertion of dialysis catheter  12/23/2010    Procedure: INSERTION OF DIALYSIS CATHETER;  Surgeon: Elam Dutch, MD;  Location: [place];  Service: Vascular;  Laterality: Right;  Right Internal Jugular 28cm dialysis catheter insertion procedure time 1701-1720   . Cholecystectomy  1993  . Cystoscopy  1990's  . Spinal growth rods  2010    "put 2 metal rods in my back; they had detetriorated" (08/30/2012)  . Av fistula placement  01/29/2011    Procedure: INSERTION OF ARTERIOVENOUS (AV) GORE-TEX GRAFT ARM;  Surgeon: Elam Dutch, MD;  Location: [place];  Service: Vascular;  Laterality: Right;  . Av fistula placement  03/10/2011    Procedure: INSERTION OF ARTERIOVENOUS (AV) GORE-TEX GRAFT THIGH;  Surgeon: Elam Dutch, MD;  Location: [place];  Service: Vascular;  Laterality: Left;  . Tubal ligation  1990's  . Aortic  valve replacement N/A 03/15/2013    AVR; Surgeon: Ivin Poot, MD;  Location: [facility] OR; Open Heart Surgery;  43mm[employer] mechanical prosthesis, top hat valve  . Coronary artery bypass graft N/A 03/15/2013    Procedure: CORONARY ARTERY BYPASS GRAFTING (CABG) times one using left internal mammary artery.;  Surgeon: Ivin Poot, MD;  Location: [place];  Service: Open Heart Surgery;  Laterality: N/A;  POSS CABG X 1  . Intraoperative transesophageal echocardiogram N/A 03/15/2013    Procedure: INTRAOPERATIVE TRANSESOPHAGEAL ECHOCARDIOGRAM;  Surgeon: Ivin Poot, MD;  Location: [place];  Service: Open Heart Surgery;  Laterality: N/A;  . Left heart catheterization with coronary/graft angiogram N/A 12/24/2011    Procedure: LEFT HEART CATHETERIZATION WITH Beatrix Fetters;  Surgeon: [name], MD;  Location: [facility] CATH LAB;  Service: Cardiovascular;  Laterality: N/A;  . Left heart catheterization with coronary angiogram N/A 07/29/2012    Procedure: LEFT HEART CATHETERIZATION WITH CORONARY ANGIOGRAM;  Surgeon: Troy Sine, MD;  Location: [facility] CATH LAB;  Service: Cardiovascular;  Laterality: N/A;  . Left heart catheterization with coronary angiogram N/A 03/10/2013    Procedure: LEFT HEART CATHETERIZATION WITH CORONARY ANGIOGRAM;  Surgeon: Troy Sine, MD;  Location: [facility] C[facility] LAB;  Service: Cardiovascular;  Laterality: N/A;  . Left heart catheterization with coronary/graft angiogram N/A 12/16/2013    Procedure: LEFT HEART CATHETERIZATION WITH Beatrix Fetters;  Surgeon: Troy Sine, MD;  Location: [facility] CATH LAB;  Service: Cardiovascular;  Laterality: N/A;  . Thrombectomy and revision of arterioventous (av) goretex  graft Left 03/30/2014  . Thrombectomy and revision of arterioventous (av) goretex  graft Left 03/30/2014    Procedure: THROMBECTOMY AND REVISION OF ARTERIOVENTOUS (AV) GORETEX  GRAFT;  Surgeon: Conrad Brush Creek, MD;  Location: [place];  Service: Vascular;  Laterality: Left;     History   Social History  . Marital Status: Divorced    Spouse Name: N/A  . Number of Children: N/A  . Years of Education: N/A   Occupational History  . Not on file.   Social History Main Topics  . Smoking status: Current Some Day Smoker -- 0.75 packs/day for 40 years    Types: Cigarettes    Last Attempt to Quit: 03/13/2013  . Smokeless tobacco: Never Used     Comment: still smokng 1 or 2 a day  . Alcohol Use: Yes     Comment: 08/30/2012 "last drink was at a wedding July, 2014; had a small glass of wine; never had problems  w/alcohol"  . Drug Use: No  . Sexual Activity: Not Currently   Other Topics Concern  . Not on file   Social History Narrative    Family History  Problem Relation Age of Onset  . Cancer Mother   . Diabetes Mother   . Hypertension Mother   . Stroke Mother   . Cancer Father   . Anesthesia problems Neg Hx   . Hypotension Neg Hx   . Malignant hyperthermia Neg Hx   . Pseudochol deficiency Neg Hx     Allergies as of 05/01/2014 - Review Complete 05/01/2014  Allergen Reaction Noted  . Acetaminophen Other (See Comments)   . Codeine Itching   . Mirtazapine Other (See Comments) 12/15/2013  . Morphine Itching 07/21/2013    Current Outpatient Prescriptions on File Prior to Visit  Medication Sig Dispense Refill  . albuterol (PROVENTIL,VENTOLIN) 90 MCG/ACT inhaler Inhale 1-2 puffs into the lungs every 6 (six) hours as needed for wheezing or shortness of breath.     [name] amLODipine (NORVASC) 2.5 MG tablet Take 1 tablet daily on non dialysis days. 30 tablet 6  . aspirin EC 81 MG tablet Take 81 mg by mouth daily.      . calcium acetate (PHOSLO) 667 MG capsule Take 667-2,001 mg by mouth See admin instructions. Take 3 capsules (2001 mg) with meals and 1 capsule (667 mg) with snacks    . cinacalcet (SENSIPAR) 30 MG tablet Take 30 mg by mouth daily.    [name] gabapentin (NEURONTIN) 300 MG capsule Take 300 mg by mouth at bedtime.     . insulin aspart (NOVOLOG) 100  UNIT/ML injection Inject 1-3 Units into the skin 3 (three) times daily as needed for high blood sugar (CBG>150). Per sliding scale    . levothyroxine (SYNTHROID, LEVOTHROID) 150 MCG tablet Take 150 mcg by mouth daily before breakfast.    . magnesium oxide (MAG-OX) 400 MG tablet Take 400 mg by mouth daily.    . metoprolol tartrate (LOPRESSOR) 25 MG tablet Take 25 mg by mouth 2 (two) times daily.     . nitroGLYCERIN (NITROSTAT) 0.4 MG SL tablet Place 1 tablet (0.4 mg total) under the tongue every 5 (five) minutes as needed for chest pain. 30 tablet 0  . ondansetron (ZOFRAN-ODT) 4 MG disintegrating tablet Take 4 mg by mouth every 8 (eight) hours as needed for nausea or vomiting.    . tacrolimus (PROGRAF) 1 MG capsule Take 3 mg by mouth 2 (two) times daily.     . traMADol (ULTRAM) 50 MG tablet Take 1 tablet (50 mg total) by mouth every 12 (twelve) hours as needed for moderate pain. 20 tablet 0  . warfarin (COUMADIN) 4 MG tablet Take 1 tablet (4 mg total) by mouth daily. (Patient taking differently: Take 4 mg by mouth daily at 6 PM. ) 30 tablet 3   No current facility-administered medications on file prior to visit.     REVIEW OF SYSTEMS: See history of present illness  PHYSICAL EXAMINATION:   Vital signs are  Filed Vitals:   05/01/14 1550 05/01/14 1630  BP: 161/85 178/51  Pulse: 69 69  Height: 5' 4.5" (1.638 m)   Weight: 179 lb (81.194 kg)   SpO2: 100%    Body mass index is 30.26 kg/(m^2). General: The patient appears their stated age. HEENT:  No gross abnormalities Pulmonary:  Non labored breathing Musculoskeletal: There are no major deformities. Neurologic: No focal weakness or paresthesias are detected, Skin: There are no ulcer or  rashes noted. Psychiatric: The patient has normal affect. Cardiovascular: Excellent thrill within left thigh dialysis graft.  All incisions have healed.  No active ulcers.   Diagnostic Studies None  Assessment: End stage renal disease Plan: The  patient's dialysis graft can be cannulated at the surgical site.  She continues to have difficulty with bleeding.  She needs to undergo a fistulogram with possible intervention at the venous anastomosis.  The patient is on Coumadin for a mechanical valve.  Her cardiologist is Dr. Georgina Peer.  We will discuss with him the appropriate management of coming off of her Coumadin for her shuntogram.  Attention: Patient's blood pressure was found to be elevated on 2 separate occasions.  We'll refer to nephrology for further management  V. Leia Alf, M.D. [facility] Office: [phone] Pager:  [phone]

## 2014-05-02 ENCOUNTER — Encounter: Payer: Self-pay | Admitting: *Deleted

## 2014-05-02 DIAGNOSIS — I359 Nonrheumatic aortic valve disorder, unspecified: Secondary | ICD-10-CM | POA: Diagnosis not present

## 2014-05-02 LAB — PROTIME-INR
INR: 1.1 (ref <=1.1)
INR: 1.7 — AB (ref 0.9–1.1)

## 2014-05-02 NOTE — Telephone Encounter (Signed)
Clearance OK to proceed

## 2014-05-02 NOTE — Telephone Encounter (Signed)
Note faxed to [facility] at [facility] fax # [phone].

## 2014-05-02 NOTE — Telephone Encounter (Signed)
What procedure is she having. Probably can hold for 4 days rather than 5

## 2014-05-02 NOTE — Telephone Encounter (Signed)
Called back to [facility], left message for Colletta [place] communicating that no lovenox bridge would be needed.

## 2014-05-02 NOTE — Telephone Encounter (Signed)
No bridge needed

## 2014-05-02 NOTE — Telephone Encounter (Signed)
Test is for left thigh shuntogram.  Orders they have given pt are: March 29th - last coumadin dose prior to proc April 4th - procedure date  They need clearance note as well.

## 2014-05-02 NOTE — Telephone Encounter (Signed)
Spoke to [place] with [facility].She wanted to ask Dr.Kelly if patient needs a lovenox bridge.Message sent to [facility].

## 2014-05-02 NOTE — Telephone Encounter (Signed)
Returned call to [place] with [facility] no answer.[place].

## 2014-05-03 ENCOUNTER — Encounter: Payer: Self-pay | Admitting: Cardiovascular Disease

## 2014-05-03 ENCOUNTER — Ambulatory Visit (INDEPENDENT_AMBULATORY_CARE_PROVIDER_SITE_OTHER): Payer: Medicare Other | Admitting: Pharmacist Clinician (PhC)/ Clinical Pharmacy Specialist

## 2014-05-03 DIAGNOSIS — Z954 Presence of other heart-valve replacement: Secondary | ICD-10-CM

## 2014-05-03 DIAGNOSIS — Z7901 Long term (current) use of anticoagulants: Secondary | ICD-10-CM

## 2014-05-03 DIAGNOSIS — Z952 Presence of prosthetic heart valve: Secondary | ICD-10-CM

## 2014-05-03 NOTE — Telephone Encounter (Signed)
Please note: error in prior note.  Pt will need a lovenox bridge. She has a mechanical aortic valve. Her procedure is set for April 4. Have Kristen set up the lovenox bridge protocol for her once coumadin is discontinued.

## 2014-05-03 NOTE — Progress Notes (Signed)
Patient ID: [name], female   DOB: 12-14-1955, 59 y.o.   MRN: [id number]    HPI: [name] is a 59 y.o. African American  female who presents to the office today for a 4 month follow up cardiology evaluation.  [name] has end-stage renal disease and is on chronic hemodialysis.  After undergoing liver transplantation at [name] for hepatitis C  She was admitted to [name] in January 2015 with increasing chest pain.  A dobutamine echo study suggested severe AS with an ejection fraction of 45%.  She  underwent cardiac catheterization by me which revealed mild CAD with 50% narrowing in moderately severe aortic stenosis with aortic valve area in the range of 0.8-1.2.  She ultimately underwent aortic valve replacement with mechanical aortic valve and single vessel LIMA to LAD CABG revascularization surgery on 03/15/2013 by [name]. Nils [name].  Following discharge, she presented to the [name] for further evaluation of chest pain and palpitations.  Troponins were negative.  Beta blocker therapy was increased to following discharge.   She undergoes dialysis on Tuesday, Thursdays, and Saturdays.  She was again hospitalized from November 5 through 12/21/2013 and presented with chest pain episodes which occurred during dialysis.  Her ECG showed new T-wave inversion in inferior leads.  She underwent a repeat cardiac catheterization on November 6 by me which showed a focal 70% mid LAD stenosis after the takeoff of the second diagonal vessel, but the LIMA graft to the mid LAD was patent.  The left circumflex vessel had 20% smooth narrowing in the second marginal branch.  The large dominant RCA was normal.  Medical therapy was recommended.  She was re-coumadinized prior to discharge.  Since [name] discharge.  She denies recurrent chest pain.  Her sleep is poor.  According to family members they have witnessed apnea.  She was supposed to have a sleep study prior to this office visit, but this was  not done because she was rehospitalized in February 2016.  She was found to have AV graft bleeding/degeneration and underwent hemodialysis graft revision of her left thigh AV graft.  She was on heparin therapy during that time and ultimately was restarted back on Coumadin.  She has noticed that her blood pressure is high on nondialysis days.  At times it gets low at the end of dialysis.    Past Medical History  Diagnosis Date  . 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 Chronic back pain   . CAD (coronary artery disease)   . Obesity   . Peripheral vascular disease hands and legs  . Anxiety   . Asthma   . GERD (gastroesophageal reflux disease)     takes Omeprazole daily  . Chronic constipation     takes MIralax and Colace daily  . Anemia     takes Folic Acid daily  . Hypothyroidism     takes Synthroid daily  . Depression     takes Cymbalta for "severe" depression  . Neuromuscular disorder     carpal tunnel in right hand  . Hypertension     takes Metoprolol and Lisinopril daily, sees [name]  . COPD (chronic obstructive pulmonary disease)   . Pneumonia     "today and several times before" (08/30/2012)  . Chronic bronchitis     "q yr w/season changes" (08/30/2012)  . Type II diabetes mellitus  Levemir 2units daily if > 150  . History of blood transfusion     "several" (08/30/2012)  . Hepatitis C   . Migraine     "last migraine was in 2013" (08/30/2012)  . Headache     "at least monthly" (08/30/2012)  . Arthritis     "left hand, back" (08/30/2012)  . End stage renal disease on dialysis 02/27/2011    Kidneys shut down at time of liver transplant in Sept 2011 at [name] in [place], she has been on [name] ever since.  Dialyzes at [name] on TTS schedule.  Had L forearm graft used 10 months then removed Dec 2012 due to suspected infection.  A right upper arm AVG was placed Dec 2012 but she developed steal  symptoms acutely and it was ligated the same day.  Never had an AV fistula due to small veins.  Now has L thigh AVG put in Jan 2013, has not clotted to date.    [name] CAD (coronary artery disease) Jan. 2015    Cath: 20% LAD, 50% D1; s/p LIMA-LAD  . Aortic stenosis   . Tobacco abuse   . S/P aortic valve replacement 03/15/13    Mechanical     Past Surgical History  Procedure Laterality Date  . Liver transplant  10/25/2009    sees [name] 1 every 6 months, saw last in Dec 2013. [name] Coord [phone]  . Small intestine surgery  90's  . Thrombectomy    . Arteriovenous graft placement Left 10/03/10     forearm  . Avgg removal  12/23/2010    Procedure: REMOVAL OF ARTERIOVENOUS GORETEX GRAFT ([place]);  Surgeon: [name] Dutch, MD;  Location: [place];  Service: Vascular;  Laterality: Left;  procedure started @1736 -1852  . Insertion of dialysis catheter  12/23/2010    Procedure: INSERTION OF DIALYSIS CATHETER;  Surgeon: [name] Dutch, MD;  Location: [place];  Service: Vascular;  Laterality: Right;  Right Internal Jugular 28cm dialysis catheter insertion procedure time 1701-1720   . Cholecystectomy  1993  . Cystoscopy  1990's  . Spinal growth rods  2010    "put 2 metal rods in my back; they had detetriorated" (08/30/2012)  . Av fistula placement  01/29/2011    Procedure: INSERTION OF ARTERIOVENOUS (AV) GORE-TEX GRAFT ARM;  Surgeon: [name] Dutch, MD;  Location: [place];  Service: Vascular;  Laterality: Right;  . Av fistula placement  03/10/2011    Procedure: INSERTION OF ARTERIOVENOUS (AV) GORE-TEX GRAFT THIGH;  Surgeon: [name] Dutch, MD;  Location: [place];  Service: Vascular;  Laterality: Left;  . Tubal ligation  1990's  . Aortic valve replacement N/A 03/15/2013    AVR; Surgeon: [name] Poot, MD;  Location: [name]; Open Heart Surgery;  56m[employer] mechanical prosthesis, top hat valve  . Coronary artery bypass graft N/A 03/15/2013    Procedure: CORONARY ARTERY BYPASS GRAFTING (CABG)  times one using left internal mammary artery.;  Surgeon: PIvin Poot MD;  [place] [facility]  Service: Open Heart Surgery;  Laterality: N/A;  POSS CABG X 1  . Intraoperative transesophageal echocardiogram N/A 03/15/2013    Procedure: INTRAOPERATIVE TRANSESOPHAGEAL ECHOCARDIOGRAM;  Surgeon: PIvin Poot MD;  [place]  Service: Open Heart Surgery;  Laterality: N/A;  . Left heart catheterization with coronary/graft angiogram N/A 12/24/2011    Procedure: LEFT HEART CATHETERIZATION WITH CBeatrix Fetters  Surgeon: JLorretta Harp MD;  [name];  Service: Cardiovascular;  Laterality: N/A;  .  Left heart catheterization with coronary angiogram N/A 07/29/2012    Procedure: LEFT HEART CATHETERIZATION WITH CORONARY ANGIOGRAM;  Surgeon: Troy Sine, MD;  [name];  Service: Cardiovascular;  Laterality: N/A;  . Left heart catheterization with coronary angiogram N/A 03/10/2013    Procedure: LEFT HEART CATHETERIZATION WITH CORONARY ANGIOGRAM;  Surgeon: Troy Sine, MD;  Location: [name] LAB;  Service: Cardiovascular;  Laterality: N/A;  . Left heart catheterization with coronary/graft angiogram N/A 12/16/2013    Procedure: LEFT HEART CATHETERIZATION WITH Beatrix Fetters;  Surgeon: Troy Sine, MD;  Location: [facility] CATH LAB;  Service: Cardiovascular;  Laterality: N/A;  . Thrombectomy and revision of arterioventous (av) goretex  graft Left 03/30/2014  . Thrombectomy and revision of arterioventous (av) goretex  graft Left 03/30/2014    Procedure: THROMBECTOMY AND REVISION OF ARTERIOVENTOUS (AV) GORETEX  GRAFT;  Surgeon: Conrad , MD;  Location: [place];  Service: Vascular;  Laterality: Left;    Allergies  Allergen Reactions  . Acetaminophen Other (See Comments)    Liver transplant recipient   . Codeine Itching  . Mirtazapine Other (See Comments)    hallucination  . Morphine Itching    Current Outpatient Prescriptions  Medication Sig Dispense  Refill  . albuterol (PROVENTIL,VENTOLIN) 90 MCG/ACT inhaler Inhale 1-2 puffs into the lungs every 6 (six) hours as needed for wheezing or shortness of breath.     [name] amLODipine (NORVASC) 2.5 MG tablet Take 1 tablet daily on non dialysis days. 30 tablet 6  . aspirin EC 81 MG tablet Take 81 mg by mouth daily.      . calcium acetate (PHOSLO) 667 MG capsule Take 667-2,001 mg by mouth See admin instructions. Take 3 capsules (2001 mg) with meals and 1 capsule (667 mg) with snacks    . cinacalcet (SENSIPAR) 30 MG tablet Take 30 mg by mouth daily.    [name] gabapentin (NEURONTIN) 300 MG capsule Take 300 mg by mouth at bedtime.     . insulin aspart (NOVOLOG) 100 UNIT/ML injection Inject 1-3 Units into the skin 3 (three) times daily as needed for high blood sugar (CBG>150). Per sliding scale    . levothyroxine (SYNTHROID, LEVOTHROID) 150 MCG tablet Take 150 mcg by mouth daily before breakfast.    . magnesium oxide (MAG-OX) 400 MG tablet Take 400 mg by mouth daily.    . metoprolol tartrate (LOPRESSOR) 25 MG tablet Take 25 mg by mouth 2 (two) times daily.     . nitroGLYCERIN (NITROSTAT) 0.4 MG SL tablet Place 1 tablet (0.4 mg total) under the tongue every 5 (five) minutes as needed for chest pain. 30 tablet 0  . ondansetron (ZOFRAN-ODT) 4 MG disintegrating tablet Take 4 mg by mouth every 8 (eight) hours as needed for nausea or vomiting.    . tacrolimus (PROGRAF) 1 MG capsule Take 3 mg by mouth 2 (two) times daily.     . traMADol (ULTRAM) 50 MG tablet Take 1 tablet (50 mg total) by mouth every 12 (twelve) hours as needed for moderate pain. 20 tablet 0  . warfarin (COUMADIN) 4 MG tablet Take 1 tablet (4 mg total) by mouth daily. (Patient taking differently: Take 4 mg by mouth daily at 6 PM. ) 30 tablet 3   No current facility-administered medications for this visit.    History   Social History  . Marital Status: Divorced    Spouse Name: N/A  . Number of Children: N/A  . Years of Education: N/A   Occupational  History  . Not on file.   Social History Main Topics  . Smoking status: Current Some Day Smoker -- 0.75 packs/day for 40 years    Types: Cigarettes    Last Attempt to Quit: 03/13/2013  . Smokeless tobacco: Never Used     Comment: still smokng 1 or 2 a day  . Alcohol Use: Yes     Comment: 08/30/2012 "last drink was at a wedding July, 2014; had a small glass of wine; never had problems w/alcohol"  . Drug Use: No  . Sexual Activity: Not Currently   Other Topics Concern  . Not on file   Social History Narrative    Family History  Problem Relation Age of Onset  . Cancer Mother   . Diabetes Mother   . Hypertension Mother   . Stroke Mother   . Cancer Father   . Anesthesia problems Neg Hx   . Hypotension Neg Hx   . Malignant hyperthermia Neg Hx   . Pseudochol deficiency Neg Hx     ROS General: Negative; No fevers, chills, or night sweats HEENT: Negative; No changes in vision or hearing, sinus congestion, difficulty swallowing Pulmonary: Negative; No cough, wheezing, shortness of breath, hemoptysis Cardiovascular: See history of present illness; No chest pain, presyncope, syncope, palpatations GI: Negative; No nausea, vomiting, diarrhea, or abdominal pain GU: Negative; No dysuria, hematuria, or difficulty voiding Musculoskeletal: Negative; no myalgias, joint pain, or weakness Hematologic: Negative; no easy bruising, bleeding Endocrine: Positive for hypothyroidism; no heat/cold intolerance; no diabetes, Neuro: Perform neuropathy; no changes in balance, headaches Skin: Negative; No rashes or skin lesions Psychiatric: Negative; No behavioral problems, depression Sleep: positive for witnessed apnea No snoring,  daytime sleepiness, hypersomnolence, bruxism, restless legs, hypnogognic hallucinations. Other comprehensive 14 point system review is negative    Physical Exam BP 160/72 mmHg  Pulse 69  Ht 5' 4.5" (1.638 m)  Wt 179 lb 4.8 oz (81.33 kg)  BMI 30.31 kg/m2 General:  Alert, oriented, no distress.  Skin: normal turgor, no rashes, warm and dry HEENT: Normocephalic, atraumatic. Pupils equal round and reactive to light; sclera anicteric; extraocular muscles intact, No lid lag; Nose without nasal septal hypertrophy; Mouth/Parynx benign; Mallinpatti scale 3 Neck: No JVD, no carotid bruits; normal carotid upstroke Lungs: clear to ausculatation and percussion bilaterally; no wheezing or rales, normal inspiratory and expiratory effort Chest wall: without tenderness to palpitation Heart: PMI not displaced, RRR, s1 s2 normal, 2/6 systolic murmur, crisp mechanical heart sounds; No diastolic murmur, no rubs, gallops, thrills, or heaves Abdomen: soft, nontender; no hepatosplenomehaly, BS+; abdominal aorta nontender and not dilated by palpation. Back: no CVA tenderness Pulses: 2+  Musculoskeletal: full range of motion, normal strength, no joint deformities Extremities: Pulses 2+, no clubbing cyanosis or edema, Homan's sign negative  Neurologic: grossly nonfocal; Cranial nerves grossly wnl Psychologic: Normal mood and affect  ECG (independently read by me): Normal sinus rhythm at 69 bpm.  Borderline first degree AV block with a PR interval 204 ms.  Inferolateral T wave abnormality.  November 2015 ECG (independently read by me): Normal sinus rhythm.  Borderline first-degree AV block with a PR interval of 202.  T abnormalities in leads 1 and L2 and V6.  LVH by voltage.  May 2015 ECG (independently read by me): Normal sinus rhythm at 72 beats per minute.  T wave changes in leads 1 and L. and V6.  QTC 486 ms.  LABS:  BMET  BMP Latest Ref Rng 04/04/2014 04/03/2014 04/02/2014  Glucose 70 - 99  mg/dL 117(H) 152(H) 103(H)  BUN 6 - 23 mg/dL 42(H) 33(H) 18  Creatinine 0.50 - 1.10 mg/dL 9.85(H) 7.74(H) 5.41(H)  Sodium 135 - 145 mmol/L 135 136 136  Potassium 3.5 - 5.1 mmol/L 4.3 4.6 4.4  Chloride 96 - 112 mmol/L 100 99 100  CO2 19 - 32 mmol/L 26 27 29   Calcium 8.4 - 10.5 mg/dL  7.4(L) 7.9(L) 7.9(L)     Hepatic Function Panel   Hepatic Function Latest Ref Rng 04/04/2014 04/03/2014 04/02/2014  Total Protein 6.0 - 8.3 g/dL - 5.8(L) 5.7(L)  Albumin 3.5 - 5.2 g/dL 2.6(L) 2.6(L) 2.6(L)  AST 0 - 37 U/L - 31 33  ALT 0 - 35 U/L - 7 6  Alk Phosphatase 39 - 117 U/L - 75 73  Total Bilirubin 0.3 - 1.2 mg/dL - 0.5 0.7  Bilirubin, Direct 0.0 - 0.3 mg/dL - - -     CBC  CBC Latest Ref Rng 04/04/2014 04/03/2014 04/02/2014  WBC 4.0 - 10.5 K/uL 4.4 4.7 5.1  Hemoglobin 12.0 - 15.0 g/dL 8.4(L) 8.9(L) 8.9(L)  Hematocrit 36.0 - 46.0 % 26.1(L) 27.2(L) 27.5(L)  Platelets 150 - 400 K/uL 68(L) 69(L) 85(L)     BNP    Component Value Date/Time   PROBNP 33098.0* 01/02/2014 2318    Lipid Panel     Component Value Date/Time   CHOL 225* 12/22/2011 1030   TRIG 121 12/22/2011 1030   HDL 97 12/22/2011 1030   CHOLHDL 2.3 12/22/2011 1030   VLDL 24 12/22/2011 1030   LDLCALC 104* 12/22/2011 1030     RADIOLOGY: Dg Chest 2 View  06/01/2013   CLINICAL DATA:  History of aortic valve replacement in February, follow-up  EXAM: CHEST  2 VIEW  COMPARISON:  Chest x-ray 04/20/2013, and CT chest of 03/11/2013  FINDINGS: Cardiomegaly is stable. There is very mild pulmonary vascular congestion present. No effusion is seen. A nodular opacity in the medial left lung apex is stable and compared to the prior CT chest represents a bony density from the upper thoracic spine. Median sternotomy sutures remain and an aortic valve replacement is present. No acute bony abnormality is seen. Multiple surgical clips are present within the epigastrium.  IMPRESSION: 1. Stable cardiomegaly. 2. Mild pulmonary vascular congestion.   Electronically Signed   By: Ivar Drape M.D.   On: 06/01/2013 09:40      ASSESSMENT AND PLAN:  Ms. Dacota Devall underwent mechanical aortic valve replacement for moderately severe symptomatic aortic stenosis and single-vessel LIMA to LAD CABG revascularization surgery in February 2015.   She had been readmitted with episodes of tachycardia and shortness of breath.  He recently was hospitalized with AV graft laterality necessitating revision.  She tolerated that surgery well without recurrent chest pain.  Her blood pressure today is elevated at 160/70 in the office and this was a nondialysis day.  I have elected to add very low-dose amlodipine at 2.5 mg for her to take on nondialysis days, but she would not take this on days of dialysis due to hypotension.  We will reschedule her for her diagnostic polysomnogram which she had not done previously to her recent hospitalization. After she left the office, she subsequently notified the office  that she will require a redo left thigh shuntogram and will need to be off Coumadin.  I suggested she hold Coumadin for 4 days, but because of her mechanical aortic valve we will contact Kristen Alstad to arrange for possible Lovenox bridging or if this is not an  option  she will need to be admitted for heparinization prior to her procedure.  Troy Sine, MD, [facility]  05/03/2014 5:40 PM

## 2014-05-04 ENCOUNTER — Telehealth: Payer: Self-pay | Admitting: Pharmacist Clinician (PhC)/ Clinical Pharmacy Specialist

## 2014-05-04 DIAGNOSIS — I359 Nonrheumatic aortic valve disorder, unspecified: Secondary | ICD-10-CM | POA: Diagnosis not present

## 2014-05-04 NOTE — Telephone Encounter (Signed)
Left message on Stephanie's personal voice mail at [facility] Dr.Kelly does want patient to have Lovenox Bridge.Our pharmacist will manage bridge she will need to know surgery date.Message sent to [facility].Note faxed to [facility] at fax # [phone].

## 2014-05-04 NOTE — Telephone Encounter (Signed)
Enoxaprin Dosing Schedule  Enoxparin dose: 80mg    Date  Warfarin Dose (evenings) Enoxaprin Dose  3-29 6 4  mg   3-30 5 0 8pm  3-31 4 0 8pm  4-1 3 0 8pm  4-2 2 0 8pm  4-3 1 0   4-4 Procedure    4-5 1 8  mg (2 tabs) 8pm  4-6 2 8  mg (2 tabs) 8pm  4-7 3 6  mg (1.5 tabs) 8pm  4-8 4 6  mg (1.5 tabs) 8pm  4-9 5 6  mg (1.5 tabs) 8pm  4-10 6      Reviewed above information with daughter over the phone. Confirmed address and will mail a copy to her as well.

## 2014-05-05 ENCOUNTER — Other Ambulatory Visit: Payer: Self-pay | Admitting: Pharmacist Clinician (PhC)/ Clinical Pharmacy Specialist

## 2014-05-05 MED ORDER — ENOXAPARIN SODIUM 80 MG/0.8ML ~~LOC~~ SOLN: 80.0000 mg | SUBCUTANEOUS | Status: DC

## 2014-05-06 NOTE — Telephone Encounter (Signed)
acknowledged

## 2014-05-07 ENCOUNTER — Other Ambulatory Visit: Payer: Self-pay | Admitting: Internal Medicine

## 2014-05-09 DIAGNOSIS — I359 Nonrheumatic aortic valve disorder, unspecified: Secondary | ICD-10-CM | POA: Diagnosis not present

## 2014-05-11 ENCOUNTER — Observation Stay (HOSPITAL_COMMUNITY): Payer: Medicare Other

## 2014-05-11 ENCOUNTER — Inpatient Hospital Stay (HOSPITAL_COMMUNITY)
Admission: EM | Admit: 2014-05-11 | Discharge: 2014-05-20 | DRG: 252 | Disposition: A | Discharge status: Home / Self Care | Payer: Medicare Other | Attending: Vascular Surgery | Admitting: Vascular Surgery

## 2014-05-11 ENCOUNTER — Other Ambulatory Visit: Payer: Self-pay

## 2014-05-11 ENCOUNTER — Telehealth: Payer: Self-pay | Admitting: Cardiovascular Disease

## 2014-05-11 ENCOUNTER — Encounter (HOSPITAL_COMMUNITY): Payer: Self-pay | Admitting: *Deleted

## 2014-05-11 DIAGNOSIS — Z79899 Other long term (current) drug therapy: Secondary | ICD-10-CM

## 2014-05-11 DIAGNOSIS — I120 Hypertensive chronic kidney disease with stage 5 chronic kidney disease or end stage renal disease: Secondary | ICD-10-CM | POA: Diagnosis present

## 2014-05-11 DIAGNOSIS — Z7901 Long term (current) use of anticoagulants: Secondary | ICD-10-CM | POA: Diagnosis not present

## 2014-05-11 DIAGNOSIS — Z794 Long term (current) use of insulin: Secondary | ICD-10-CM | POA: Diagnosis not present

## 2014-05-11 DIAGNOSIS — I350 Nonrheumatic aortic (valve) stenosis: Secondary | ICD-10-CM | POA: Diagnosis present

## 2014-05-11 DIAGNOSIS — F329 Major depressive disorder, single episode, unspecified: Secondary | ICD-10-CM | POA: Diagnosis present

## 2014-05-11 DIAGNOSIS — R279 Unspecified lack of coordination: Secondary | ICD-10-CM | POA: Diagnosis not present

## 2014-05-11 DIAGNOSIS — I2510 Atherosclerotic heart disease of native coronary artery without angina pectoris: Secondary | ICD-10-CM | POA: Diagnosis present

## 2014-05-11 DIAGNOSIS — Z743 Need for continuous supervision: Secondary | ICD-10-CM | POA: Diagnosis not present

## 2014-05-11 DIAGNOSIS — N186 End stage renal disease: Secondary | ICD-10-CM | POA: Diagnosis present

## 2014-05-11 DIAGNOSIS — E039 Hypothyroidism, unspecified: Secondary | ICD-10-CM | POA: Diagnosis present

## 2014-05-11 DIAGNOSIS — T829XXA Unspecified complication of cardiac and vascular prosthetic device, implant and graft, initial encounter: Secondary | ICD-10-CM | POA: Diagnosis present

## 2014-05-11 DIAGNOSIS — Z992 Dependence on renal dialysis: Secondary | ICD-10-CM

## 2014-05-11 DIAGNOSIS — K219 Gastro-esophageal reflux disease without esophagitis: Secondary | ICD-10-CM | POA: Diagnosis present

## 2014-05-11 DIAGNOSIS — I739 Peripheral vascular disease, unspecified: Secondary | ICD-10-CM | POA: Diagnosis present

## 2014-05-11 DIAGNOSIS — D696 Thrombocytopenia, unspecified: Secondary | ICD-10-CM | POA: Diagnosis present

## 2014-05-11 DIAGNOSIS — F17210 Nicotine dependence, cigarettes, uncomplicated: Secondary | ICD-10-CM | POA: Diagnosis present

## 2014-05-11 DIAGNOSIS — N2581 Secondary hyperparathyroidism of renal origin: Secondary | ICD-10-CM | POA: Diagnosis present

## 2014-05-11 DIAGNOSIS — J45909 Unspecified asthma, uncomplicated: Secondary | ICD-10-CM | POA: Diagnosis present

## 2014-05-11 DIAGNOSIS — Y832 Surgical operation with anastomosis, bypass or graft as the cause of abnormal reaction of the patient, or of later complication, without mention of misadventure at the time of the procedure: Secondary | ICD-10-CM | POA: Diagnosis present

## 2014-05-11 DIAGNOSIS — F419 Anxiety disorder, unspecified: Secondary | ICD-10-CM | POA: Diagnosis present

## 2014-05-11 DIAGNOSIS — R58 Hemorrhage, not elsewhere classified: Secondary | ICD-10-CM | POA: Diagnosis not present

## 2014-05-11 DIAGNOSIS — Z01810 Encounter for preprocedural cardiovascular examination: Secondary | ICD-10-CM | POA: Diagnosis not present

## 2014-05-11 DIAGNOSIS — Z951 Presence of aortocoronary bypass graft: Secondary | ICD-10-CM | POA: Diagnosis not present

## 2014-05-11 DIAGNOSIS — Z952 Presence of prosthetic heart valve: Secondary | ICD-10-CM | POA: Diagnosis not present

## 2014-05-11 DIAGNOSIS — Z01818 Encounter for other preprocedural examination: Secondary | ICD-10-CM | POA: Diagnosis not present

## 2014-05-11 DIAGNOSIS — Z944 Liver transplant status: Secondary | ICD-10-CM

## 2014-05-11 DIAGNOSIS — T8640 Unspecified complication of liver transplant: Secondary | ICD-10-CM | POA: Diagnosis not present

## 2014-05-11 DIAGNOSIS — J449 Chronic obstructive pulmonary disease, unspecified: Secondary | ICD-10-CM | POA: Diagnosis present

## 2014-05-11 DIAGNOSIS — E1129 Type 2 diabetes mellitus with other diabetic kidney complication: Secondary | ICD-10-CM | POA: Diagnosis not present

## 2014-05-11 DIAGNOSIS — Z8249 Family history of ischemic heart disease and other diseases of the circulatory system: Secondary | ICD-10-CM

## 2014-05-11 DIAGNOSIS — Z7982 Long term (current) use of aspirin: Secondary | ICD-10-CM | POA: Diagnosis not present

## 2014-05-11 DIAGNOSIS — K5900 Constipation, unspecified: Secondary | ICD-10-CM | POA: Diagnosis present

## 2014-05-11 DIAGNOSIS — T82838A Hemorrhage of vascular prosthetic devices, implants and grafts, initial encounter: Principal | ICD-10-CM | POA: Diagnosis present

## 2014-05-11 DIAGNOSIS — I729 Aneurysm of unspecified site: Secondary | ICD-10-CM

## 2014-05-11 DIAGNOSIS — E119 Type 2 diabetes mellitus without complications: Secondary | ICD-10-CM | POA: Diagnosis present

## 2014-05-11 LAB — CBC
HCT: 34.4 % — ABNORMAL LOW (ref 36.0–46.0)
HEMOGLOBIN: 10.8 g/dL — AB (ref 12.0–15.0)
MCH: 31 pg (ref 26.0–34.0)
MCHC: 31.4 g/dL (ref 30.0–36.0)
MCV: 98.9 fL (ref 78.0–100.0)
PLATELETS: 119 10*3/uL — AB (ref 150–400)
RBC: 3.48 MIL/uL — ABNORMAL LOW (ref 3.87–5.11)
RDW: 17.1 % — ABNORMAL HIGH (ref 11.5–15.5)
WBC: 4.9 10*3/uL (ref 4.0–10.5)

## 2014-05-11 LAB — COMPREHENSIVE METABOLIC PANEL
ALK PHOS: 91 U/L (ref 39–117)
ALT: 22 U/L (ref 0–35)
ANION GAP: 13 (ref 5–15)
AST: 37 U/L (ref 0–37)
Albumin: 2.9 g/dL — ABNORMAL LOW (ref 3.5–5.2)
BUN: 23 mg/dL (ref 6–23)
CO2: 31 mmol/L (ref 19–32)
Calcium: 8.6 mg/dL (ref 8.4–10.5)
Chloride: 96 mmol/L (ref 96–112)
Creatinine, Ser: 5.87 mg/dL — ABNORMAL HIGH (ref 0.50–1.10)
GFR calc non Af Amer: 7 mL/min — ABNORMAL LOW (ref >90)
GFR, EST AFRICAN AMERICAN: 8 mL/min — AB (ref >90)
GLUCOSE: 162 mg/dL — AB (ref 70–99)
POTASSIUM: 4.5 mmol/L (ref 3.5–5.1)
SODIUM: 140 mmol/L (ref 135–145)
Total Bilirubin: 0.6 mg/dL (ref 0.3–1.2)
Total Protein: 7.2 g/dL (ref 6.0–8.3)

## 2014-05-11 LAB — PROTIME-INR
INR: 2.3 — ABNORMAL HIGH (ref 0.00–1.49)
Prothrombin Time: 25.5 seconds — ABNORMAL HIGH (ref 11.6–15.2)

## 2014-05-11 MED ORDER — HYDRALAZINE HCL 20 MG/ML IJ SOLN: 5.0000 mg | INTRAMUSCULAR | Status: DC | PRN

## 2014-05-11 MED ORDER — SODIUM CHLORIDE 0.9 % IJ SOLN: 3.0000 mL | INTRAMUSCULAR | Status: DC | PRN

## 2014-05-11 MED ORDER — SODIUM CHLORIDE 0.9 % IV SOLN: 250.0000 mL | INTRAVENOUS | Status: DC | PRN

## 2014-05-11 MED ORDER — ONDANSETRON HCL 4 MG/2ML IJ SOLN
4.0000 mg | Freq: Four times a day (QID) | INTRAMUSCULAR | Status: DC | PRN
  Administered 2014-05-13: 4 mg via INTRAVENOUS
  Filled 2014-05-11: qty 2

## 2014-05-11 MED ORDER — SODIUM CHLORIDE 0.9 % IJ SOLN
3.0000 mL | Freq: Two times a day (BID) | INTRAMUSCULAR | Status: DC
Start: 2014-05-11 — End: 2014-05-15
  Administered 2014-05-11 – 2014-05-15 (×6): 3 mL via INTRAVENOUS

## 2014-05-11 MED ORDER — LABETALOL HCL 5 MG/ML IV SOLN
10.0000 mg | INTRAVENOUS | Status: DC | PRN
  Filled 2014-05-11: qty 4

## 2014-05-11 MED ORDER — PANTOPRAZOLE SODIUM 40 MG PO TBEC
40.0000 mg | DELAYED_RELEASE_TABLET | Freq: Every day | ORAL | Status: DC
  Administered 2014-05-12 – 2014-05-15 (×4): 40 mg via ORAL
  Filled 2014-05-11 (×4): qty 1

## 2014-05-11 MED ORDER — OXYCODONE HCL 5 MG PO TABS
5.0000 mg | ORAL_TABLET | ORAL | Status: DC | PRN
  Administered 2014-05-13 – 2014-05-15 (×2): 5 mg via ORAL
  Filled 2014-05-11: qty 1

## 2014-05-11 MED ORDER — MORPHINE SULFATE 2 MG/ML IJ SOLN
2.0000 mg | INTRAMUSCULAR | Status: DC | PRN
  Administered 2014-05-13: 2 mg via INTRAVENOUS

## 2014-05-11 MED ORDER — DIPHENHYDRAMINE HCL 25 MG PO CAPS: 25.0000 mg | ORAL_CAPSULE | Freq: Four times a day (QID) | ORAL | Status: DC | PRN

## 2014-05-11 MED ORDER — INSULIN ASPART 100 UNIT/ML ~~LOC~~ SOLN
0.0000 [IU] | SUBCUTANEOUS | Status: DC
  Administered 2014-05-12: 2 [IU] via SUBCUTANEOUS
  Administered 2014-05-12: 3 [IU] via SUBCUTANEOUS
  Administered 2014-05-12 – 2014-05-13 (×3): 2 [IU] via SUBCUTANEOUS
  Administered 2014-05-13: 5 [IU] via SUBCUTANEOUS
  Administered 2014-05-14: 3 [IU] via SUBCUTANEOUS
  Administered 2014-05-14 (×2): 2 [IU] via SUBCUTANEOUS
  Administered 2014-05-15: 3 [IU] via SUBCUTANEOUS
  Administered 2014-05-15 – 2014-05-16 (×2): 2 [IU] via SUBCUTANEOUS
  Administered 2014-05-16 – 2014-05-18 (×8): 3 [IU] via SUBCUTANEOUS
  Administered 2014-05-19 (×2): 2 [IU] via SUBCUTANEOUS
  Administered 2014-05-19 – 2014-05-20 (×2): 3 [IU] via SUBCUTANEOUS

## 2014-05-11 MED ORDER — METOPROLOL TARTRATE 1 MG/ML IV SOLN: 2.0000 mg | INTRAVENOUS | Status: DC | PRN

## 2014-05-11 MED ORDER — DOCUSATE SODIUM 100 MG PO CAPS
100.0000 mg | ORAL_CAPSULE | Freq: Two times a day (BID) | ORAL | Status: DC
  Administered 2014-05-12 – 2014-05-15 (×7): 100 mg via ORAL
  Filled 2014-05-11 (×10): qty 1

## 2014-05-11 NOTE — Telephone Encounter (Signed)
Returned call to patient's daughter [name].Stated she wanted to let Dr.Kelly and Erasmo Downer know her mother is being transported to [facility] ER from Dialysis in [place].Stated she just started Lovenox injections for a upcoming procedure.Stated she was at dialysis and they cannot get her left leg to stop bleeding.Advised ER Dr will know what to do.Advised will send message to Dr.Kelly so he will be aware.

## 2014-05-11 NOTE — ED Notes (Signed)
Per EMS-pt presents for a bleeding fistula in left lower leg.  Pt had dialysis today and was going home when her graft began to bleed.  Pt went to [facility] [place] where they were unable to get the graft to stop bleeding as well.  Pressure being applied on arrival, not bleeding at this time.  Vascular MD at bedside.  Pt started Lovenox Monday.  Pt usually on Coumadin,  INR reported 4.5 at [facility].  BP-140/80 P-86 O2-98% RA.  Pt a x 4, NAD.

## 2014-05-11 NOTE — ED Notes (Signed)
CBG 110. 

## 2014-05-11 NOTE — ED Provider Notes (Signed)
CSN: [id number]     Arrival date & time 05/11/14  1632 History  First MD Initiated Contact with Patient 05/11/14 1634     Chief Complaint  Patient presents with  . Vascular Access Problem    HPI The patient presents to the emergency room to see Dr Oneida Alar for evaluation of persistent bleeding from a fistula in her left upper thigh. Patient had dialysis today per her usual routine. When they removed the dialysis catheters the area began to bleed. The patient had pressure applied to the area and the bleeding would temporarily stop but would soon return.  She was evaluated at [facility] and they could not get the bleeding to stop. She was transferred to the emergency room to see the vascular surgeon on call, Dr. Oneida Alar. Patient is no longer taking Coumadin but is taking Lovenox. She had some pressure applied during transport. At this time the bleeding has stopped. Dr. Oneida Alar arrived at the bedside and will continue her evaluation Past Medical History  Diagnosis Date  . 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 Chronic back pain   . CAD (coronary artery disease)   . Obesity   . Peripheral vascular disease hands and legs  . Anxiety   . Asthma   . GERD (gastroesophageal reflux disease)     takes Omeprazole daily  . Chronic constipation     takes MIralax and Colace daily  . Anemia     takes Folic Acid daily  . Hypothyroidism     takes Synthroid daily  . Depression     takes Cymbalta for "severe" depression  . Neuromuscular disorder     carpal tunnel in right hand  . Hypertension     takes Metoprolol and Lisinopril daily, sees Dr Bea Graff  . COPD (chronic obstructive pulmonary disease)   . Pneumonia     "today and several times before" (08/30/2012)  . Chronic bronchitis     "q yr w/season changes" (08/30/2012)  . Type II diabetes mellitus     Levemir 2units daily if > 150  . History of blood transfusion    "several" (08/30/2012)  . Hepatitis C   . Migraine     "last migraine was in 2013" (08/30/2012)  . Headache     "at least monthly" (08/30/2012)  . Arthritis     "left hand, back" (08/30/2012)  . End stage renal disease on dialysis 02/27/2011    Kidneys shut down at time of liver transplant in Sept 2011 at [facility] in [place], she has been on HD ever since.  Dialyzes at [facility] HD on TTS schedule.  Had L forearm graft used 10 months then removed Dec 2012 due to suspected infection.  A right upper arm AVG was placed Dec 2012 but she developed steal symptoms acutely and it was ligated the same day.  Never had an AV fistula due to small veins.  Now has L thigh AVG put in Jan 2013, has not clotted to date.    [name] CAD (coronary artery disease) Jan. 2015    Cath: 20% LAD, 50% D1; s/p LIMA-LAD  . Aortic stenosis   . Tobacco abuse   . S/P aortic valve replacement 03/15/13    Mechanical    Past Surgical History  Procedure Laterality Date  . Liver transplant  10/25/2009    sees Dr Ferol Luz 1 every 6 months, saw last  in Dec 2013. [name] Coord [phone]  . Small intestine surgery  90's  . Thrombectomy    . Arteriovenous graft placement Left 10/03/10     forearm  . Avgg removal  12/23/2010    Procedure: REMOVAL OF ARTERIOVENOUS GORETEX GRAFT ([place]);  Surgeon: Elam Dutch, MD;  Location: [place];  Service: Vascular;  Laterality: Left;  procedure started @1736 -1852  . Insertion of dialysis catheter  12/23/2010    Procedure: INSERTION OF DIALYSIS CATHETER;  Surgeon: Elam Dutch, MD;  Location: [place];  Service: Vascular;  Laterality: Right;  Right Internal Jugular 28cm dialysis catheter insertion procedure time 1701-1720   . Cholecystectomy  1993  . Cystoscopy  1990's  . Spinal growth rods  2010    "put 2 metal rods in my back; they had detetriorated" (08/30/2012)  . Av fistula placement  01/29/2011    Procedure: INSERTION OF ARTERIOVENOUS (AV) GORE-TEX GRAFT ARM;  Surgeon: Elam Dutch,  MD;  Location: [place];  Service: Vascular;  Laterality: Right;  . Av fistula placement  03/10/2011    Procedure: INSERTION OF ARTERIOVENOUS (AV) GORE-TEX GRAFT THIGH;  Surgeon: Elam Dutch, MD;  Location: [place];  Service: Vascular;  Laterality: Left;  . Tubal ligation  1990's  . Aortic valve replacement N/A 03/15/2013    AVR; Surgeon: Ivin Poot, MD;  Location: [facility] OR; Open Heart Surgery;  8mmCarboMedics mechanical prosthesis, top hat valve  . Coronary artery bypass graft N/A 03/15/2013    Procedure: CORONARY ARTERY BYPASS GRAFTING (CABG) times one using left internal mammary artery.;  Surgeon: Ivin Poot, MD;  Location: [place];  Service: Open Heart Surgery;  Laterality: N/A;  POSS CABG X 1  . Intraoperative transesophageal echocardiogram N/A 03/15/2013    Procedure: INTRAOPERATIVE TRANSESOPHAGEAL ECHOCARDIOGRAM;  Surgeon: Ivin Poot, MD;  Location: [place];  Service: Open Heart Surgery;  Laterality: N/A;  . Left heart catheterization with coronary/graft angiogram N/A 12/24/2011    Procedure: LEFT HEART CATHETERIZATION WITH Beatrix Fetters;  Surgeon: [name], MD;  Location: [facility] CATH LAB;  Service: Cardiovascular;  Laterality: N/A;  . Left heart catheterization with coronary angiogram N/A 07/29/2012    Procedure: LEFT HEART CATHETERIZATION WITH CORONARY ANGIOGRAM;  Surgeon: Troy Sine, MD;  Location: [facility] CATH LAB;  Service: Cardiovascular;  Laterality: N/A;  . Left heart catheterization with coronary angiogram N/A 03/10/2013    Procedure: LEFT HEART CATHETERIZATION WITH CORONARY ANGIOGRAM;  Surgeon: Troy Sine, MD;  Location: [facility] CATH LAB;  Service: Cardiovascular;  Laterality: N/A;  . Left heart catheterization with coronary/graft angiogram N/A 12/16/2013    Procedure: LEFT HEART CATHETERIZATION WITH Beatrix Fetters;  Surgeon: Troy Sine, MD;  Location: [facility] CATH LAB;  Service: Cardiovascular;  Laterality: N/A;  . Thrombectomy and revision of arterioventous  (av) goretex  graft Left 03/30/2014  . Thrombectomy and revision of arterioventous (av) goretex  graft Left 03/30/2014    Procedure: THROMBECTOMY AND REVISION OF ARTERIOVENTOUS (AV) GORETEX  GRAFT;  Surgeon: Conrad [place], MD;  Location: [facility] OR;  Service: Vascular;  Laterality: Left;   Family History  Problem Relation Age of Onset  . Cancer Mother   . Diabetes Mother   . Hypertension Mother   . Stroke Mother   . Cancer Father   . Anesthesia problems Neg Hx   . Hypotension Neg Hx   . Malignant hyperthermia Neg Hx   . Pseudochol deficiency Neg Hx    History  Substance Use Topics  .  Smoking status: Current Some Day Smoker -- 0.75 packs/day for 40 years    Types: Cigarettes    Last Attempt to Quit: 03/13/2013  . Smokeless tobacco: Never Used     Comment: still smokng 1 or 2 a day  . Alcohol Use: Yes     Comment: 08/30/2012 "last drink was at a wedding July, 2014; had a small glass of wine; never had problems w/alcohol"   OB History    No data available     Review of Systems  Constitutional: Negative for fever.  Respiratory: Negative for shortness of breath.   Cardiovascular: Negative for chest pain.      Allergies  Acetaminophen; Codeine; Mirtazapine; and Morphine  Home Medications   Prior to Admission medications   Medication Sig Start Date End Date Taking? Authorizing Provider  albuterol (PROVENTIL,VENTOLIN) 90 MCG/ACT inhaler Inhale 1-2 puffs into the lungs every 6 (six) hours as needed for wheezing or shortness of breath.     Historical Provider, MD  amLODipine (NORVASC) 2.5 MG tablet Take 1 tablet daily on non dialysis days. 05/01/14   Troy Sine, MD  aspirin EC 81 MG tablet Take 81 mg by mouth daily.      Historical Provider, MD  calcium acetate (PHOSLO) 667 MG capsule Take 667-2,001 mg by mouth See admin instructions. Take 3 capsules (2001 mg) with meals and 1 capsule (667 mg) with snacks    Historical Provider, MD  cinacalcet (SENSIPAR) 30 MG tablet Take 30 mg by  mouth daily.    Historical Provider, MD  enoxaparin (LOVENOX) 80 MG/0.8ML injection Inject 0.8 mLs (80 mg total) into the skin daily. 05/05/14   Troy Sine, MD  gabapentin (NEURONTIN) 300 MG capsule Take 300 mg by mouth at bedtime.     Historical Provider, MD  insulin aspart (NOVOLOG) 100 UNIT/ML injection Inject 1-3 Units into the skin 3 (three) times daily as needed for high blood sugar (CBG>150). Per sliding scale    Historical Provider, MD  levothyroxine (SYNTHROID, LEVOTHROID) 150 MCG tablet Take 150 mcg by mouth daily before breakfast.    Historical Provider, MD  magnesium oxide (MAG-OX) 400 MG tablet Take 400 mg by mouth daily.    Historical Provider, MD  metoprolol tartrate (LOPRESSOR) 25 MG tablet Take 25 mg by mouth 2 (two) times daily.  03/16/14   Historical Provider, MD  nitroGLYCERIN (NITROSTAT) 0.4 MG SL tablet Place 1 tablet (0.4 mg total) under the tongue every 5 (five) minutes as needed for chest pain. 03/20/14   Silver Huguenin Elgergawy, MD  ondansetron (ZOFRAN-ODT) 4 MG disintegrating tablet Take 4 mg by mouth every 8 (eight) hours as needed for nausea or vomiting.    Historical Provider, MD  tacrolimus (PROGRAF) 1 MG capsule Take 3 mg by mouth 2 (two) times daily.     Historical Provider, MD  traMADol (ULTRAM) 50 MG tablet Take 1 tablet (50 mg total) by mouth every 12 (twelve) hours as needed for moderate pain. 03/20/14   Albertine Patricia, MD  warfarin (COUMADIN) 4 MG tablet Take 1 tablet (4 mg total) by mouth daily. Patient taking differently: Take 4 mg by mouth daily at 6 PM.  02/24/14   Alexa Sherral Hammers, MD   BP 161/71 mmHg  Pulse 69  Temp(Src) 98.1 F (36.7 C) (Oral)  Resp 13  SpO2 98% Physical Exam  Constitutional: She appears well-developed and well-nourished. No distress.  HENT:  Head: Normocephalic and atraumatic.  Right Ear: External ear normal.  Left Ear:  External ear normal.  Eyes: Conjunctivae are normal. Right eye exhibits no discharge. Left eye exhibits no  discharge. No scleral icterus.  Neck: Neck supple. No tracheal deviation present.  Cardiovascular: Normal rate.   Pulmonary/Chest: Effort normal. No stridor. No respiratory distress.  Musculoskeletal: She exhibits no edema.  AV fistula left thigh with palpable pulse and refill, no bleeding noted  Neurological: She is alert. Cranial nerve deficit: no gross deficits.  Skin: Skin is warm and dry. No rash noted.  Psychiatric: She has a normal mood and affect.  Nursing note and vitals reviewed.   ED Course  Procedures (including critical care time)   MDM   Patient was transferred to see Dr. Oneida Alar. I performed just a brief medical screening exam. He will assume care for the patient.   Dorie Rank, MD 05/11/14 [phone]

## 2014-05-11 NOTE — Telephone Encounter (Signed)
[name](Daughter) is calling about Mrs. Elrod is reference to the injections for a procedure in which she is having a procedure and they couldn't stop her bleeding at Dialysis nor at [facility] so thaty are sending her to [facility] . Would like to speak to someone about the injections . Please call

## 2014-05-11 NOTE — H&P (Addendum)
[facility] History and Physical   History of Present Illness  [name] is a 59 y.o. ([birth year]) female who presents with chief complaint: bleeding left AV thigh graft. She has had multiple bleeding episodes before. She previously presented to the [facility] ED on 03/30/14 for a similar episode and was taken to the OR for revision of her left thigh arteriovenous graft with interposition graft. Her bleeding started following completion of dialysis this morning at the cannulation sites. She then presented to the [facility] ED where her bleeding was unable to be controlled and was transferred to the [facility] ED. She was last seen in the [facility] office by Dr. Trula Slade on 05/01/14. Plans were made for shuntogram with possible intervention at the venous anastomosis with Dr. Bridgett Larsson on 05/15/14.   She is currently on coumadin for a mechanical heart valve but was stopped on Monday in anticipation for her procedure. She is currently on lovenox per her cardiologist Dr [name].   She has a past medical history of CAD, DM2, HTN and PAD all of which are stable.   On ROS, she denies any chest pain, shortness of breath, weakness or dizziness. Full ROS below.   Past Medical History  Diagnosis Date  . 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 Chronic back pain   . CAD (coronary artery disease)   . Obesity   . Peripheral vascular disease hands and legs  . Anxiety   . Asthma   . GERD (gastroesophageal reflux disease)     takes Omeprazole daily  . Chronic constipation     takes MIralax and Colace daily  . Anemia     takes Folic Acid daily  . Hypothyroidism     takes Synthroid daily  . Depression     takes Cymbalta for "severe" depression  . Neuromuscular disorder     carpal tunnel in right hand  . Hypertension     takes Metoprolol and Lisinopril daily, sees Dr Bea Graff  . COPD (chronic obstructive pulmonary  disease)   . Pneumonia     "today and several times before" (08/30/2012)  . Chronic bronchitis     "q yr w/season changes" (08/30/2012)  . Type II diabetes mellitus     Levemir 2units daily if > 150  . History of blood transfusion     "several" (08/30/2012)  . Hepatitis C   . Migraine     "last migraine was in 2013" (08/30/2012)  . Headache     "at least monthly" (08/30/2012)  . Arthritis     "left hand, back" (08/30/2012)  . End stage renal disease on dialysis 02/27/2011    Kidneys shut down at time of liver transplant in Sept 2011 at [facility] in [place], she has been on HD ever since.  Dialyzes at [facility] HD on TTS schedule.  Had L forearm graft used 10 months then removed Dec 2012 due to suspected infection.  A right upper arm AVG was placed Dec 2012 but she developed steal symptoms acutely and it was ligated the same day.  Never had an AV fistula due to small veins.  Now has L thigh AVG put in Jan 2013, has not clotted to date.    [name] CAD (coronary artery disease) Jan. 2015    Cath: 20% LAD, 50% D1; s/p LIMA-LAD  . Aortic stenosis   . Tobacco  abuse   . S/P aortic valve replacement 03/15/13    Mechanical     Past Surgical History  Procedure Laterality Date  . Liver transplant  10/25/2009    sees Dr Ferol Luz 1 every 6 months, saw last in Dec 2013. [name] Coord [phone]  . Small intestine surgery  90's  . Thrombectomy    . Arteriovenous graft placement Left 10/03/10     forearm  . Avgg removal  12/23/2010    Procedure: REMOVAL OF ARTERIOVENOUS GORETEX GRAFT ([place]);  Surgeon: Elam Dutch, MD;  Location: [place];  Service: Vascular;  Laterality: Left;  procedure started @1736 -1852  . Insertion of dialysis catheter  12/23/2010    Procedure: INSERTION OF DIALYSIS CATHETER;  Surgeon: Elam Dutch, MD;  Location: [place];  Service: Vascular;  Laterality: Right;  Right Internal Jugular 28cm dialysis catheter insertion procedure time 1701-1720   . Cholecystectomy  1993  .  Cystoscopy  1990's  . Spinal growth rods  2010    "put 2 metal rods in my back; they had detetriorated" (08/30/2012)  . Av fistula placement  01/29/2011    Procedure: INSERTION OF ARTERIOVENOUS (AV) GORE-TEX GRAFT ARM;  Surgeon: Elam Dutch, MD;  Location: [place];  Service: Vascular;  Laterality: Right;  . Av fistula placement  03/10/2011    Procedure: INSERTION OF ARTERIOVENOUS (AV) GORE-TEX GRAFT THIGH;  Surgeon: Elam Dutch, MD;  Location: [place];  Service: Vascular;  Laterality: Left;  . Tubal ligation  1990's  . Aortic valve replacement N/A 03/15/2013    AVR; Surgeon: Ivin Poot, MD;  Location: [facility] OR; Open Heart Surgery;  13mmCarboMedics mechanical prosthesis, top hat valve  . Coronary artery bypass graft N/A 03/15/2013    Procedure: CORONARY ARTERY BYPASS GRAFTING (CABG) times one using left internal mammary artery.;  Surgeon: Ivin Poot, MD;  Location: [place];  Service: Open Heart Surgery;  Laterality: N/A;  POSS CABG X 1  . Intraoperative transesophageal echocardiogram N/A 03/15/2013    Procedure: INTRAOPERATIVE TRANSESOPHAGEAL ECHOCARDIOGRAM;  Surgeon: Ivin Poot, MD;  Location: [place];  Service: Open Heart Surgery;  Laterality: N/A;  . Left heart catheterization with coronary/graft angiogram N/A 12/24/2011    Procedure: LEFT HEART CATHETERIZATION WITH Beatrix Fetters;  Surgeon: [name], MD;  Location: [facility] CATH LAB;  Service: Cardiovascular;  Laterality: N/A;  . Left heart catheterization with coronary angiogram N/A 07/29/2012    Procedure: LEFT HEART CATHETERIZATION WITH CORONARY ANGIOGRAM;  Surgeon: Troy Sine, MD;  Location: [facility] CATH LAB;  Service: Cardiovascular;  Laterality: N/A;  . Left heart catheterization with coronary angiogram N/A 03/10/2013    Procedure: LEFT HEART CATHETERIZATION WITH CORONARY ANGIOGRAM;  Surgeon: Troy Sine, MD;  Location: [facility] CATH LAB;  Service: Cardiovascular;  Laterality: N/A;  . Left heart catheterization with  coronary/graft angiogram N/A 12/16/2013    Procedure: LEFT HEART CATHETERIZATION WITH Beatrix Fetters;  Surgeon: Troy Sine, MD;  Location: [facility] CATH LAB;  Service: Cardiovascular;  Laterality: N/A;  . Thrombectomy and revision of arterioventous (av) goretex  graft Left 03/30/2014  . Thrombectomy and revision of arterioventous (av) goretex  graft Left 03/30/2014    Procedure: THROMBECTOMY AND REVISION OF ARTERIOVENTOUS (AV) GORETEX  GRAFT;  Surgeon: Conrad St. Edward, MD;  Location: [place];  Service: Vascular;  Laterality: Left;    History   Social History  . Marital Status: Divorced    Spouse Name: N/A  . Number of Children: N/A  . Years  of Education: N/A   Occupational History  . Not on file.   Social History Main Topics  . Smoking status: Current Some Day Smoker -- 0.75 packs/day for 40 years    Types: Cigarettes    Last Attempt to Quit: 03/13/2013  . Smokeless tobacco: Never Used     Comment: still smokng 1 or 2 a day  . Alcohol Use: Yes     Comment: 08/30/2012 "last drink was at a wedding July, 2014; had a small glass of wine; never had problems w/alcohol"  . Drug Use: No  . Sexual Activity: Not Currently   Other Topics Concern  . Not on file   Social History Narrative    Family History  Problem Relation Age of Onset  . Cancer Mother   . Diabetes Mother   . Hypertension Mother   . Stroke Mother   . Cancer Father   . Anesthesia problems Neg Hx   . Hypotension Neg Hx   . Malignant hyperthermia Neg Hx   . Pseudochol deficiency Neg Hx     No current facility-administered medications on file prior to encounter.   Current Outpatient Prescriptions on File Prior to Encounter  Medication Sig Dispense Refill  . albuterol (PROVENTIL,VENTOLIN) 90 MCG/ACT inhaler Inhale 1-2 puffs into the lungs every 6 (six) hours as needed for wheezing or shortness of breath.     [name] amLODipine (NORVASC) 2.5 MG tablet Take 1 tablet daily on non dialysis days. 30 tablet 6  . aspirin EC 81  MG tablet Take 81 mg by mouth daily.      . calcium acetate (PHOSLO) 667 MG capsule Take 667-2,001 mg by mouth See admin instructions. Take 3 capsules (2001 mg) with meals and 1 capsule (667 mg) with snacks    . cinacalcet (SENSIPAR) 30 MG tablet Take 30 mg by mouth daily.    [name] enoxaparin (LOVENOX) 80 MG/0.8ML injection Inject 0.8 mLs (80 mg total) into the skin daily. 10 Syringe 0  . gabapentin (NEURONTIN) 300 MG capsule Take 300 mg by mouth at bedtime.     . insulin aspart (NOVOLOG) 100 UNIT/ML injection Inject 1-3 Units into the skin 3 (three) times daily as needed for high blood sugar (CBG>150). Per sliding scale    . levothyroxine (SYNTHROID, LEVOTHROID) 150 MCG tablet Take 150 mcg by mouth daily before breakfast.    . magnesium oxide (MAG-OX) 400 MG tablet Take 400 mg by mouth daily.    . metoprolol tartrate (LOPRESSOR) 25 MG tablet Take 25 mg by mouth 2 (two) times daily.     . nitroGLYCERIN (NITROSTAT) 0.4 MG SL tablet Place 1 tablet (0.4 mg total) under the tongue every 5 (five) minutes as needed for chest pain. 30 tablet 0  . ondansetron (ZOFRAN-ODT) 4 MG disintegrating tablet Take 4 mg by mouth every 8 (eight) hours as needed for nausea or vomiting.    . tacrolimus (PROGRAF) 1 MG capsule Take 3 mg by mouth 2 (two) times daily.     . traMADol (ULTRAM) 50 MG tablet Take 1 tablet (50 mg total) by mouth every 12 (twelve) hours as needed for moderate pain. 20 tablet 0  . warfarin (COUMADIN) 4 MG tablet Take 1 tablet (4 mg total) by mouth daily. (Patient taking differently: Take 4 mg by mouth daily at 6 PM. ) 30 tablet 3    Allergies  Allergen Reactions  . Acetaminophen Other (See Comments)    Liver transplant recipient   . Codeine Itching  . Mirtazapine Other (  See Comments)    hallucination  . Morphine Itching    REVIEW OF SYSTEMS:  (Positives checked otherwise negative)  CARDIOVASCULAR:  []  chest pain, []  chest pressure, []  palpitations, []  shortness of breath when laying flat, []   shortness of breath with exertion,  []  pain in feet when walking, []  pain in feet when laying flat, []  history of blood clot in veins (DVT), []  history of phlebitis, []  swelling in legs, []  varicose veins  PULMONARY:  []  productive cough, []  asthma, []  wheezing  NEUROLOGIC:  []  weakness in arms or legs, []  numbness in arms or legs, []  difficulty speaking or slurred speech, []  temporary loss of vision in one eye, []  dizziness  HEMATOLOGIC:  []  bleeding problems, []  problems with blood clotting too easily  MUSCULOSKEL:  []  joint pain, []  joint swelling  GASTROINTEST:  []  vomiting blood, []  blood in stool     RENAL: [x]  ESRD on HD []  burning with urination, []  blood in urine  PSYCHIATRIC:  []  history of major depression  INTEGUMENTARY:  []  rashes, []  ulcers  CONSTITUTIONAL:  []  fever, []  chills  Physical Examination  Filed Vitals:   05/11/14 1643 05/11/14 1645 05/11/14 1646  BP: 161/71    Pulse: 69    Temp: 98.1 F (36.7 C)    TempSrc: Oral    Resp: 13    SpO2: 98% 98% 98%    There is no weight on file to calculate BMI.  General: A&O x 3, WDWN female in NAD  Head: [place]/AT  Neck: Supple  Pulmonary: Sym exp, good air movt, CTAB, no rales, rhonchi, & wheezing  Cardiac: RRR, Nl S1, S2, no Murmurs, rubs or gallops  Vascular: left thigh AV graft with palpable thrill. No active bleeding.   Gastrointestinal: soft, NTND, -G/R, - HSM, - masses  Musculoskeletal: Extremities without ischemic changes  Neurologic: CN 2-12 grossly intact  Psychiatric: Judgment intact, Mood & affect appropriate for pt's clinical situation  Dermatologic: See M/S exam for extremity exam, no rashes otherwise noted  Medical Decision Making  [name] is a 59 y.o. female who presents with: repeated bleeding from left thigh AV thigh graft. Her graft was no longer bleeding upon evaluation in the ED. She dialyzes on Tuesdays, Thursdays and Saturdays. She previously had revision of her left thigh  arteriovenous graft with interposition graft on 03/30/14. She continued to have bleeding issues and was scheduled for a shuntogram with possible intervention on 05/15/14 with Dr. 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 She is on coumadin for a mechanical heart valve but this was stopped and replaced with lovenox in anticipation for her procedure.   Given her continued bleeding, we will admit her to our service and plan for shuntogram with possible intervention with Dr. Oneida Alar tomorrow, 05/12/14. We will check PT/INR and reverse as appropriate.   Virgina Jock, PA-C [facility] [place] Office: [phone] Pager: [phone]  05/11/2014, 4:47 PM  Agree with above Several recent bleeding episodes from thigh graft Will do shuntogram in am Admit tonight in case of recurrent bleeding NPO p midnight Check INR  Ruta Hinds, MD [facility] [place]: [phone] Pager: [phone]

## 2014-05-11 NOTE — Telephone Encounter (Signed)
OK thank you 

## 2014-05-12 ENCOUNTER — Ambulatory Visit (HOSPITAL_COMMUNITY): Admission: RE | Admit: 2014-05-12 | Payer: Medicare Other | Source: Ambulatory Visit | Admitting: Vascular Surgery

## 2014-05-12 DIAGNOSIS — N186 End stage renal disease: Secondary | ICD-10-CM | POA: Diagnosis not present

## 2014-05-12 DIAGNOSIS — Z794 Long term (current) use of insulin: Secondary | ICD-10-CM | POA: Diagnosis not present

## 2014-05-12 DIAGNOSIS — Z992 Dependence on renal dialysis: Secondary | ICD-10-CM | POA: Diagnosis not present

## 2014-05-12 DIAGNOSIS — E1129 Type 2 diabetes mellitus with other diabetic kidney complication: Secondary | ICD-10-CM | POA: Diagnosis not present

## 2014-05-12 LAB — GLUCOSE, CAPILLARY
GLUCOSE-CAPILLARY: 100 mg/dL — AB (ref 70–99)
GLUCOSE-CAPILLARY: 110 mg/dL — AB (ref 70–99)
GLUCOSE-CAPILLARY: 128 mg/dL — AB (ref 70–99)
GLUCOSE-CAPILLARY: 140 mg/dL — AB (ref 70–99)
GLUCOSE-CAPILLARY: 147 mg/dL — AB (ref 70–99)
Glucose-Capillary: 122 mg/dL — ABNORMAL HIGH (ref 70–99)
Glucose-Capillary: 175 mg/dL — ABNORMAL HIGH (ref 70–99)

## 2014-05-12 LAB — TYPE AND SCREEN
ABO/RH(D): A POS
Antibody Screen: NEGATIVE

## 2014-05-12 LAB — PROTIME-INR
INR: 2.36 — ABNORMAL HIGH (ref 0.00–1.49)
Prothrombin Time: 26 seconds — ABNORMAL HIGH (ref 11.6–15.2)

## 2014-05-12 LAB — SURGICAL PCR SCREEN
MRSA, PCR: NEGATIVE
Staphylococcus aureus: NEGATIVE

## 2014-05-12 MED ORDER — CALCIUM ACETATE (PHOS BINDER) 667 MG PO CAPS
667.0000 mg | ORAL_CAPSULE | Freq: Two times a day (BID) | ORAL | Status: DC | PRN
  Filled 2014-05-12: qty 1

## 2014-05-12 MED ORDER — METOPROLOL TARTRATE 25 MG PO TABS
25.0000 mg | ORAL_TABLET | Freq: Two times a day (BID) | ORAL | Status: DC
  Administered 2014-05-12 – 2014-05-19 (×13): 25 mg via ORAL
  Filled 2014-05-12 (×18): qty 1

## 2014-05-12 MED ORDER — SODIUM CHLORIDE 0.9 % IV SOLN: Freq: Once | INTRAVENOUS | Status: DC

## 2014-05-12 MED ORDER — SODIUM CHLORIDE 0.9 % IV SOLN: 100.0000 mL | INTRAVENOUS | Status: DC | PRN

## 2014-05-12 MED ORDER — TRAMADOL HCL 50 MG PO TABS
50.0000 mg | ORAL_TABLET | Freq: Two times a day (BID) | ORAL | Status: DC | PRN
  Administered 2014-05-15 – 2014-05-16 (×2): 50 mg via ORAL
  Filled 2014-05-12 (×2): qty 1

## 2014-05-12 MED ORDER — NICOTINE 21 MG/24HR TD PT24
21.0000 mg | MEDICATED_PATCH | Freq: Every day | TRANSDERMAL | Status: DC
  Administered 2014-05-12 – 2014-05-20 (×8): 21 mg via TRANSDERMAL
  Filled 2014-05-12 (×9): qty 1

## 2014-05-12 MED ORDER — AMLODIPINE BESYLATE 2.5 MG PO TABS
2.5000 mg | ORAL_TABLET | Freq: Every day | ORAL | Status: DC
  Administered 2014-05-12 – 2014-05-19 (×6): 2.5 mg via ORAL
  Filled 2014-05-12 (×9): qty 1

## 2014-05-12 MED ORDER — LIDOCAINE HCL (PF) 1 % IJ SOLN: 5.0000 mL | INTRAMUSCULAR | Status: DC | PRN

## 2014-05-12 MED ORDER — ASPIRIN EC 81 MG PO TBEC
81.0000 mg | DELAYED_RELEASE_TABLET | Freq: Every day | ORAL | Status: DC
  Administered 2014-05-12 – 2014-05-20 (×8): 81 mg via ORAL
  Filled 2014-05-12 (×9): qty 1

## 2014-05-12 MED ORDER — TACROLIMUS 1 MG PO CAPS
3.0000 mg | ORAL_CAPSULE | Freq: Two times a day (BID) | ORAL | Status: DC
  Administered 2014-05-12 – 2014-05-20 (×16): 3 mg via ORAL
  Filled 2014-05-12 (×20): qty 3

## 2014-05-12 MED ORDER — ALTEPLASE 2 MG IJ SOLR
2.0000 mg | Freq: Once | INTRAMUSCULAR | Status: AC | PRN
  Filled 2014-05-12: qty 2

## 2014-05-12 MED ORDER — MAGNESIUM OXIDE 400 MG PO TABS
400.0000 mg | ORAL_TABLET | Freq: Every day | ORAL | Status: DC
Start: 2014-05-12 — End: 2014-05-20
  Administered 2014-05-12 – 2014-05-20 (×8): 400 mg via ORAL
  Filled 2014-05-12 (×9): qty 1

## 2014-05-12 MED ORDER — HEPARIN SODIUM (PORCINE) 1000 UNIT/ML DIALYSIS
1000.0000 [IU] | INTRAMUSCULAR | Status: DC | PRN
  Administered 2014-05-13: 1000 [IU] via INTRAVENOUS_CENTRAL
  Filled 2014-05-12 (×2): qty 1

## 2014-05-12 MED ORDER — NITROGLYCERIN 0.4 MG SL SUBL: 0.4000 mg | SUBLINGUAL_TABLET | SUBLINGUAL | Status: DC | PRN

## 2014-05-12 MED ORDER — GABAPENTIN 300 MG PO CAPS
300.0000 mg | ORAL_CAPSULE | Freq: Every day | ORAL | Status: DC
  Administered 2014-05-12 – 2014-05-19 (×8): 300 mg via ORAL
  Filled 2014-05-12 (×9): qty 1

## 2014-05-12 MED ORDER — CALCITRIOL 0.5 MCG PO CAPS
0.5000 ug | ORAL_CAPSULE | ORAL | Status: DC
  Administered 2014-05-16 – 2014-05-18 (×2): 0.5 ug via ORAL
  Filled 2014-05-12 (×4): qty 1

## 2014-05-12 MED ORDER — ONDANSETRON 4 MG PO TBDP
4.0000 mg | ORAL_TABLET | Freq: Three times a day (TID) | ORAL | Status: DC | PRN
  Filled 2014-05-12: qty 1

## 2014-05-12 MED ORDER — CINACALCET HCL 30 MG PO TABS
30.0000 mg | ORAL_TABLET | Freq: Every day | ORAL | Status: DC
  Administered 2014-05-12 – 2014-05-20 (×7): 30 mg via ORAL
  Filled 2014-05-12 (×11): qty 1

## 2014-05-12 MED ORDER — ALBUTEROL SULFATE (2.5 MG/3ML) 0.083% IN NEBU: 3.0000 mL | INHALATION_SOLUTION | Freq: Four times a day (QID) | RESPIRATORY_TRACT | Status: DC | PRN

## 2014-05-12 MED ORDER — LEVOTHYROXINE SODIUM 150 MCG PO TABS
150.0000 ug | ORAL_TABLET | Freq: Every day | ORAL | Status: DC
  Administered 2014-05-13 – 2014-05-20 (×8): 150 ug via ORAL
  Filled 2014-05-12 (×10): qty 1

## 2014-05-12 MED ORDER — PENTAFLUOROPROP-TETRAFLUOROETH EX AERO: 1.0000 "application " | INHALATION_SPRAY | CUTANEOUS | Status: DC | PRN

## 2014-05-12 MED ORDER — LIDOCAINE-PRILOCAINE 2.5-2.5 % EX CREA
1.0000 "application " | TOPICAL_CREAM | CUTANEOUS | Status: DC | PRN
  Filled 2014-05-12: qty 5

## 2014-05-12 MED ORDER — NEPRO/CARBSTEADY PO LIQD
237.0000 mL | ORAL | Status: DC | PRN
  Filled 2014-05-12: qty 237

## 2014-05-12 MED ORDER — CALCIUM ACETATE 667 MG PO CAPS
2001.0000 mg | ORAL_CAPSULE | Freq: Three times a day (TID) | ORAL | Status: DC
  Administered 2014-05-12 – 2014-05-14 (×3): 2001 mg via ORAL
  Filled 2014-05-12 (×9): qty 3

## 2014-05-12 NOTE — Progress Notes (Signed)
Called about pt wanting nicotine patch.  Will order for pt.  Kevin Mario 05/12/2014 1:40 PM

## 2014-05-12 NOTE — Progress Notes (Signed)
Pt INR still > 2 today despite not being on coumadin for 5 days.  No bleeding overnight.  Will cancel shuntogram for today and reschedule with Dr Bridgett Larsson for Monday as originally planned.  Will notify nephrology of pt admission and have them give her FFP with dialysis tomorrow if her INR is still elevated.  Will transition to heparin as INR trends down in prepartation for shuntogram on Monday.  Ruta Hinds, MD [facility] Office: [phone] Pager: [phone]

## 2014-05-12 NOTE — Progress Notes (Signed)
ANTICOAGULATION CONSULT NOTE - Initial Consult  Pharmacy Consult for heparin Indication: mechanical AVR  Allergies  Allergen Reactions  . Acetaminophen Other (See Comments)    Liver transplant recipient   . Codeine Itching  . Mirtazapine Other (See Comments)    hallucination  . Morphine Itching    Patient Measurements: Height: 5\' 4"  (162.6 cm) Weight: 173 lb 12.8 oz (78.835 kg) IBW/kg (Calculated) : 54.7   Vital Signs: Temp: 98.1 F (36.7 C) (04/01 0420) Temp Source: Oral (04/01 0420) BP: 104/52 mmHg (04/01 0436) Pulse Rate: 76 (04/01 0420)  Labs:  Recent Labs  05/11/14 2226 05/12/14 0450  HGB 10.8*  --   HCT 34.4*  --   PLT 119*  --   LABPROT 25.5* 26.0*  INR 2.30* 2.36*  CREATININE 5.87*  --     Estimated Creatinine Clearance: 10.5 mL/min (by C-G formula based on Cr of 5.87).   Medical History: Past Medical History  Diagnosis Date  . 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 Chronic back pain   . CAD (coronary artery disease)   . Obesity   . Peripheral vascular disease hands and legs  . Anxiety   . Asthma   . GERD (gastroesophageal reflux disease)     takes Omeprazole daily  . Chronic constipation     takes MIralax and Colace daily  . Anemia     takes Folic Acid daily  . Hypothyroidism     takes Synthroid daily  . Depression     takes Cymbalta for "severe" depression  . Neuromuscular disorder     carpal tunnel in right hand  . Hypertension     takes Metoprolol and Lisinopril daily, sees Dr Bea Graff  . COPD (chronic obstructive pulmonary disease)   . Pneumonia     "today and several times before" (08/30/2012)  . Chronic bronchitis     "q yr w/season changes" (08/30/2012)  . Type II diabetes mellitus     Levemir 2units daily if > 150  . History of blood transfusion     "several" (08/30/2012)  . Hepatitis C   . Migraine     "last migraine was in 2013" (08/30/2012)  .  Headache     "at least monthly" (08/30/2012)  . Arthritis     "left hand, back" (08/30/2012)  . End stage renal disease on dialysis 02/27/2011    Kidneys shut down at time of liver transplant in Sept 2011 at [facility] in [place], she has been on HD ever since.  Dialyzes at [facility] HD on TTS schedule.  Had L forearm graft used 10 months then removed Dec 2012 due to suspected infection.  A right upper arm AVG was placed Dec 2012 but she developed steal symptoms acutely and it was ligated the same day.  Never had an AV fistula due to small veins.  Now has L thigh AVG put in Jan 2013, has not clotted to date.    [name] CAD (coronary artery disease) Jan. 2015    Cath: 20% LAD, 50% D1; s/p LIMA-LAD  . Aortic stenosis   . Tobacco abuse   . S/P aortic valve replacement 03/15/13    Mechanical     Medications:  Prescriptions prior to admission  Medication Sig Dispense Refill Last Dose  . albuterol (PROVENTIL,VENTOLIN) 90 MCG/ACT inhaler Inhale 1-2 puffs into the lungs every 6 (six) hours as needed for  wheezing or shortness of breath.    Past Month at Unknown time  . amLODipine (NORVASC) 2.5 MG tablet Take 1 tablet daily on non dialysis days. 30 tablet 6 05/10/2014 at Unknown time  . aspirin EC 81 MG tablet Take 81 mg by mouth daily.     05/10/2014 at Unknown time  . calcium acetate (PHOSLO) 667 MG capsule Take 667-2,001 mg by mouth See admin instructions. Take 3 capsules (2001 mg) with meals and 1 capsule (667 mg) with snacks   05/10/2014 at Unknown time  . cinacalcet (SENSIPAR) 30 MG tablet Take 30 mg by mouth daily.   05/10/2014 at Unknown time  . enoxaparin (LOVENOX) 80 MG/0.8ML injection Inject 0.8 mLs (80 mg total) into the skin daily. 10 Syringe 0 05/10/2014 at 8p  . gabapentin (NEURONTIN) 300 MG capsule Take 300 mg by mouth at bedtime.    05/10/2014 at Unknown time  . insulin aspart (NOVOLOG) 100 UNIT/ML injection Inject 1-3 Units into the skin 3 (three) times daily as needed for high blood sugar (CBG>150). Per  sliding scale   Been a few months  . levothyroxine (SYNTHROID, LEVOTHROID) 150 MCG tablet Take 150 mcg by mouth daily before breakfast.   05/10/2014 at Unknown time  . magnesium oxide (MAG-OX) 400 MG tablet Take 400 mg by mouth daily.   05/10/2014 at Unknown time  . metoprolol tartrate (LOPRESSOR) 25 MG tablet Take 25 mg by mouth 2 (two) times daily.    05/10/2014 at 2300  . ondansetron (ZOFRAN-ODT) 4 MG disintegrating tablet Take 4 mg by mouth every 8 (eight) hours as needed for nausea or vomiting.   Past Month at Unknown time  . tacrolimus (PROGRAF) 1 MG capsule Take 3 mg by mouth 2 (two) times daily.    05/10/2014 at Unknown time  . traMADol (ULTRAM) 50 MG tablet Take 1 tablet (50 mg total) by mouth every 12 (twelve) hours as needed for moderate pain. 20 tablet 0 Past Week at Unknown time  . nitroGLYCERIN (NITROSTAT) 0.4 MG SL tablet Place 1 tablet (0.4 mg total) under the tongue every 5 (five) minutes as needed for chest pain. 30 tablet 0 unknown    Assessment: 59 yo lady to start heparin when INR <1.7.  Her INR this am 2.36. Goal of Therapy:  Heparin level 0.3-0.7 units/ml Monitor platelets by anticoagulation protocol: Yes   Plan:  Will f/u INR tomorrow and start heparin when INR <1.7  [name] 05/12/2014,10:31 AM

## 2014-05-12 NOTE — Progress Notes (Signed)
UR completed 

## 2014-05-12 NOTE — Consult Note (Signed)
Indication for Consultation:  Management of ESRD/hemodialysis; anemia, hypertension/volume and secondary hyperparathyroidism  HPI: [name] is a 59 y.o. female who presented to the ED yesterday after HD with prolonged bleeding from AVG. She receives HD TTS in [place], history of HTN, DM, liver transplant, aortic valve replacement- on coumadin. She has had episodes of bleeding form AVG in the past, on 2/18 she had a revision of AVG d/t prolonged bleeding. She continued to have bleeding post HD, she was seen by Dr Trula Slade 3/21 who planned shuntogram w possible intervention 4/4. Coumadin had been on hold for this upcoming procedure, INR 3/29 was 2.72. INR was over 2 this AM so [facility] was unable to do intervention today. Will arrange HD while admitted. She is feeling well, is frustrated with this process  Past Medical History  Diagnosis Date  . 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 Chronic back pain   . CAD (coronary artery disease)   . Obesity   . Peripheral vascular disease hands and legs  . Anxiety   . Asthma   . GERD (gastroesophageal reflux disease)     takes Omeprazole daily  . Chronic constipation     takes MIralax and Colace daily  . Anemia     takes Folic Acid daily  . Hypothyroidism     takes Synthroid daily  . Depression     takes Cymbalta for "severe" depression  . Neuromuscular disorder     carpal tunnel in right hand  . Hypertension     takes Metoprolol and Lisinopril daily, sees Dr Bea Graff  . COPD (chronic obstructive pulmonary disease)   . Pneumonia     "today and several times before" (08/30/2012)  . Chronic bronchitis     "q yr w/season changes" (08/30/2012)  . Type II diabetes mellitus     Levemir 2units daily if > 150  . History of blood transfusion     "several" (08/30/2012)  . Hepatitis C   . Migraine     "last migraine was in 2013" (08/30/2012)  . Headache     "at least monthly"  (08/30/2012)  . Arthritis     "left hand, back" (08/30/2012)  . End stage renal disease on dialysis 02/27/2011    Kidneys shut down at time of liver transplant in Sept 2011 at [facility] in [place], she has been on HD ever since.  Dialyzes at [facility] HD on TTS schedule.  Had L forearm graft used 10 months then removed Dec 2012 due to suspected infection.  A right upper arm AVG was placed Dec 2012 but she developed steal symptoms acutely and it was ligated the same day.  Never had an AV fistula due to small veins.  Now has L thigh AVG put in Jan 2013, has not clotted to date.    [name] CAD (coronary artery disease) Jan. 2015    Cath: 20% LAD, 50% D1; s/p LIMA-LAD  . Aortic stenosis   . Tobacco abuse   . S/P aortic valve replacement 03/15/13    Mechanical    Past Surgical History  Procedure Laterality Date  . Liver transplant  10/25/2009    sees Dr Ferol Luz 1 every 6 months, saw last in Dec 2013. [name] Coord [phone]  . Small intestine surgery  90's  . Thrombectomy    . Arteriovenous graft placement Left 10/03/10     forearm  .  Avgg removal  12/23/2010    Procedure: REMOVAL OF ARTERIOVENOUS GORETEX GRAFT ([place]);  Surgeon: Elam Dutch, MD;  Location: [place];  Service: Vascular;  Laterality: Left;  procedure started @1736 -3557  . Insertion of dialysis catheter  12/23/2010    Procedure: INSERTION OF DIALYSIS CATHETER;  Surgeon: Elam Dutch, MD;  Location: [place];  Service: Vascular;  Laterality: Right;  Right Internal Jugular 28cm dialysis catheter insertion procedure time 1701-1720   . Cholecystectomy  1993  . Cystoscopy  1990's  . Spinal growth rods  2010    "put 2 metal rods in my back; they had detetriorated" (08/30/2012)  . Av fistula placement  01/29/2011    Procedure: INSERTION OF ARTERIOVENOUS (AV) GORE-TEX GRAFT ARM;  Surgeon: Elam Dutch, MD;  Location: [place];  Service: Vascular;  Laterality: Right;  . Av fistula placement  03/10/2011    Procedure: INSERTION OF  ARTERIOVENOUS (AV) GORE-TEX GRAFT THIGH;  Surgeon: Elam Dutch, MD;  Location: [place];  Service: Vascular;  Laterality: Left;  . Tubal ligation  1990's  . Aortic valve replacement N/A 03/15/2013    AVR; Surgeon: Ivin Poot, MD;  Location: [facility] OR; Open Heart Surgery;  5mm[employer] mechanical prosthesis, top hat valve  . Coronary artery bypass graft N/A 03/15/2013    Procedure: CORONARY ARTERY BYPASS GRAFTING (CABG) times one using left internal mammary artery.;  Surgeon: Ivin Poot, MD;  Location: [place];  Service: Open Heart Surgery;  Laterality: N/A;  POSS CABG X 1  . Intraoperative transesophageal echocardiogram N/A 03/15/2013    Procedure: INTRAOPERATIVE TRANSESOPHAGEAL ECHOCARDIOGRAM;  Surgeon: Ivin Poot, MD;  Location: [place];  Service: Open Heart Surgery;  Laterality: N/A;  . Left heart catheterization with coronary/graft angiogram N/A 12/24/2011    Procedure: LEFT HEART CATHETERIZATION WITH Beatrix Fetters;  Surgeon: [name], MD;  Location: [facility] CATH LAB;  Service: Cardiovascular;  Laterality: N/A;  . Left heart catheterization with coronary angiogram N/A 07/29/2012    Procedure: LEFT HEART CATHETERIZATION WITH CORONARY ANGIOGRAM;  Surgeon: Troy Sine, MD;  Location: [facility] CATH LAB;  Service: Cardiovascular;  Laterality: N/A;  . Left heart catheterization with coronary angiogram N/A 03/10/2013    Procedure: LEFT HEART CATHETERIZATION WITH CORONARY ANGIOGRAM;  Surgeon: Troy Sine, MD;  Location: [facility] CATH LAB;  Service: Cardiovascular;  Laterality: N/A;  . Left heart catheterization with coronary/graft angiogram N/A 12/16/2013    Procedure: LEFT HEART CATHETERIZATION WITH Beatrix Fetters;  Surgeon: Troy Sine, MD;  Location: [facility] CATH LAB;  Service: Cardiovascular;  Laterality: N/A;  . Thrombectomy and revision of arterioventous (av) goretex  graft Left 03/30/2014  . Thrombectomy and revision of arterioventous (av) goretex  graft Left 03/30/2014     Procedure: THROMBECTOMY AND REVISION OF ARTERIOVENTOUS (AV) GORETEX  GRAFT;  Surgeon: Conrad Kalaeloa, MD;  Location: [facility] OR;  Service: Vascular;  Laterality: Left;   Family History  Problem Relation Age of Onset  . Cancer Mother   . Diabetes Mother   . Hypertension Mother   . Stroke Mother   . Cancer Father   . Anesthesia problems Neg Hx   . Hypotension Neg Hx   . Malignant hyperthermia Neg Hx   . Pseudochol deficiency Neg Hx    Social History:  reports that she has been smoking Cigarettes.  She has a 30 pack-year smoking history. She has never used smokeless tobacco. She reports that she drinks alcohol. She reports that she does not use illicit drugs.  Allergies  Allergen Reactions  . Acetaminophen Other (See Comments)    Liver transplant recipient   . Codeine Itching  . Mirtazapine Other (See Comments)    hallucination  . Morphine Itching   Prior to Admission medications   Medication Sig Start Date End Date Taking? Authorizing Provider  albuterol (PROVENTIL,VENTOLIN) 90 MCG/ACT inhaler Inhale 1-2 puffs into the lungs every 6 (six) hours as needed for wheezing or shortness of breath.    Yes Historical Provider, MD  amLODipine (NORVASC) 2.5 MG tablet Take 1 tablet daily on non dialysis days. 05/01/14  Yes Troy Sine, MD  aspirin EC 81 MG tablet Take 81 mg by mouth daily.     Yes Historical Provider, MD  calcium acetate (PHOSLO) 667 MG capsule Take 667-2,001 mg by mouth See admin instructions. Take 3 capsules (2001 mg) with meals and 1 capsule (667 mg) with snacks   Yes Historical Provider, MD  cinacalcet (SENSIPAR) 30 MG tablet Take 30 mg by mouth daily.   Yes Historical Provider, MD  enoxaparin (LOVENOX) 80 MG/0.8ML injection Inject 0.8 mLs (80 mg total) into the skin daily. 05/05/14  Yes Troy Sine, MD  gabapentin (NEURONTIN) 300 MG capsule Take 300 mg by mouth at bedtime.    Yes Historical Provider, MD  insulin aspart (NOVOLOG) 100 UNIT/ML injection Inject 1-3 Units into the  skin 3 (three) times daily as needed for high blood sugar (CBG>150). Per sliding scale   Yes Historical Provider, MD  levothyroxine (SYNTHROID, LEVOTHROID) 150 MCG tablet Take 150 mcg by mouth daily before breakfast.   Yes Historical Provider, MD  magnesium oxide (MAG-OX) 400 MG tablet Take 400 mg by mouth daily.   Yes Historical Provider, MD  metoprolol tartrate (LOPRESSOR) 25 MG tablet Take 25 mg by mouth 2 (two) times daily.  03/16/14  Yes Historical Provider, MD  ondansetron (ZOFRAN-ODT) 4 MG disintegrating tablet Take 4 mg by mouth every 8 (eight) hours as needed for nausea or vomiting.   Yes Historical Provider, MD  tacrolimus (PROGRAF) 1 MG capsule Take 3 mg by mouth 2 (two) times daily.    Yes Historical Provider, MD  traMADol (ULTRAM) 50 MG tablet Take 1 tablet (50 mg total) by mouth every 12 (twelve) hours as needed for moderate pain. 03/20/14  Yes Albertine Patricia, MD  nitroGLYCERIN (NITROSTAT) 0.4 MG SL tablet Place 1 tablet (0.4 mg total) under the tongue every 5 (five) minutes as needed for chest pain. 03/20/14   Albertine Patricia, MD   Current Facility-Administered Medications  Medication Dose Route Frequency Provider Last Rate Last Dose  . 0.9 %  sodium chloride infusion  250 mL Intravenous PRN Elam Dutch, MD      . 0.9 %  sodium chloride infusion   Intravenous Once Elam Dutch, MD      . albuterol (PROVENTIL) (2.5 MG/3ML) 0.083% nebulizer solution 3 mL  3 mL Inhalation Q6H PRN Hulen Shouts Rhyne, PA-C      . amLODipine (NORVASC) tablet 2.5 mg  2.5 mg Oral Daily Hulen Shouts Rhyne, PA-C   2.5 mg at 05/12/14 1218  . aspirin EC tablet 81 mg  81 mg Oral Daily Hulen Shouts Rhyne, PA-C   81 mg at 05/12/14 1219  . calcium acetate (PHOSLO) capsule 2,001 mg  2,001 mg Oral TID WC Samantha J Rhyne, PA-C   2,001 mg at 05/12/14 1214  . calcium acetate (PHOSLO) capsule 667 mg  667 mg Oral BID BM & HS PRN Jessy Oto  Fields, MD      . cinacalcet [facility]) tablet 30 mg  30 mg Oral Q breakfast  Hulen Shouts Rhyne, PA-C   30 mg at 05/12/14 1216  . diphenhydrAMINE (BENADRYL) capsule 25 mg  25 mg Oral Q6H PRN Elam Dutch, MD      . docusate sodium (COLACE) capsule 100 mg  100 mg Oral BID Elam Dutch, MD   100 mg at 05/12/14 1045  . gabapentin (NEURONTIN) capsule 300 mg  300 mg Oral QHS Samantha J Rhyne, PA-C      . hydrALAZINE (APRESOLINE) injection 5 mg  5 mg Intravenous Q20 Min PRN Elam Dutch, MD      . insulin aspart (novoLOG) injection 0-15 Units  0-15 Units Subcutaneous 6 times per day Elam Dutch, MD   2 Units at 05/12/14 0000  . labetalol (NORMODYNE,TRANDATE) injection 10 mg  10 mg Intravenous Q10 min PRN Elam Dutch, MD      . Derrill Memo ON 05/13/2014] levothyroxine (SYNTHROID, LEVOTHROID) tablet 150 mcg  150 mcg Oral QAC breakfast Samantha J Rhyne, PA-C      . magnesium oxide (MAG-OX) tablet 400 mg  400 mg Oral Daily Hulen Shouts Rhyne, PA-C   400 mg at 05/12/14 1219  . metoprolol (LOPRESSOR) injection 2-5 mg  2-5 mg Intravenous Q2H PRN Elam Dutch, MD      . metoprolol tartrate (LOPRESSOR) tablet 25 mg  25 mg Oral BID Hulen Shouts Rhyne, PA-C   25 mg at 05/12/14 1218  . morphine 2 MG/ML injection 2-5 mg  2-5 mg Intravenous Q1H PRN Elam Dutch, MD      . nitroGLYCERIN (NITROSTAT) SL tablet 0.4 mg  0.4 mg Sublingual Q5 min PRN Samantha J Rhyne, PA-C      . ondansetron (ZOFRAN) injection 4 mg  4 mg Intravenous Q6H PRN Elam Dutch, MD      . ondansetron (ZOFRAN-ODT) disintegrating tablet 4 mg  4 mg Oral Q8H PRN Samantha J Rhyne, PA-C      . oxyCODONE (Oxy IR/ROXICODONE) immediate release tablet 5-10 mg  5-10 mg Oral Q4H PRN Elam Dutch, MD      . pantoprazole (PROTONIX) EC tablet 40 mg  40 mg Oral Daily Elam Dutch, MD   40 mg at 05/12/14 1045  . sodium chloride 0.9 % injection 3 mL  3 mL Intravenous Q12H Elam Dutch, MD   3 mL at 05/12/14 1000  . sodium chloride 0.9 % injection 3 mL  3 mL Intravenous PRN Elam Dutch, MD      .  tacrolimus (PROGRAF) capsule 3 mg  3 mg Oral BID Hulen Shouts Rhyne, PA-C   3 mg at 05/12/14 1215  . traMADol (ULTRAM) tablet 50 mg  50 mg Oral Q12H PRN Gabriel Earing, PA-C       Labs: Basic Metabolic Panel:  Recent Labs Lab 05/11/14 2226  NA 140  K 4.5  CL 96  CO2 31  GLUCOSE 162*  BUN 23  CREATININE 5.87*  CALCIUM 8.6   Liver Function Tests:  Recent Labs Lab 05/11/14 2226  AST 37  ALT 22  ALKPHOS 91  BILITOT 0.6  PROT 7.2  ALBUMIN 2.9*   No results for input(s): LIPASE, AMYLASE in the last 168 hours. No results for input(s): AMMONIA in the last 168 hours. CBC:  Recent Labs Lab 05/11/14 2226  WBC 4.9  HGB 10.8*  HCT 34.4*  MCV 98.9  PLT 119*  Cardiac Enzymes: No results for input(s): CKTOTAL, CKMB, CKMBINDEX, TROPONINI in the last 168 hours. CBG:  Recent Labs Lab 05/11/14 1716 05/12/14 05/12/14 0418 05/12/14 0826 05/12/14 1129  GLUCAP 110* 147* 100* 122* 140*   Iron Studies: No results for input(s): IRON, TIBC, TRANSFERRIN, FERRITIN in the last 72 hours. Studies/Results: Dg Chest Port 1 View  05/11/2014   CLINICAL DATA:  Preop for vascular surgery.  EXAM: PORTABLE CHEST - 1 VIEW  COMPARISON:  03/19/2014  FINDINGS: The heart is borderline enlarged but stable. Stable surgical changes with median sternotomy wires. The lungs are clear. No pleural effusion. The bony thorax is intact.  IMPRESSION: No acute cardiopulmonary findings.   Electronically Signed   By: Marijo Sanes M.D.   On: 05/11/2014 19:30     Review of Systems: Feeling well. Denies chest pain/sob Positive only for bleeding from AVG post HD- usually takes an hour to stop bleeding. Occasional bleeding the day after HD.   Physical Exam: Filed Vitals:   05/12/14 0420 05/12/14 0433 05/12/14 0436 05/12/14 1100  BP: 100/35 100/40 104/52 128/62  Pulse: 76   71  Temp: 98.1 F (36.7 C)     TempSrc: Oral     Resp: 17     Height:      Weight: 78.835 kg (173 lb 12.8 oz)     SpO2: 100%         General: Well developed, well nourished, in no acute distress. Head: Normocephalic, atraumatic, sclera non-icteric, mucus membranes are moist Neck: Supple. JVD not elevated. Lungs: Clear bilaterally to auscultation without wheezes, rales, or rhonchi. Breathing is unlabored. Heart: RRR with S1 S2. No murmurs, rubs, or gallops appreciated. Abdomen: Soft, non-tender, non-distended with normoactive bowel sounds. No rebound/guarding. No obvious abdominal masses. M-S:  Strength and tone appear normal for age. Lower extremities:without edema or ischemic changes, no open wounds  Neuro: Alert and oriented X 3. Moves all extremities spontaneously. Psych:  Responds to questions appropriately with a normal affect. Dialysis Access: L thigh AVG +b/t no bleeding  Dialysis Orders: TTS [name] 3hr 45 mins   78 kgs   2K/2.25Ca    400/1.5  No heparin Micera 75 q 2 weeks(last 3/22- next dose due 4/5)    Calcitriol 0.5  Assessment/Plan: 1.  bleeding AVG- VVS following. shuntogram w possible intervention cancelled today d/t INR 2.36, coumadin has been held x 5 days and on lovenox in the interim- recheck INR pending tomorrow, will need FFP if remains elevated. Shuntogram now planned for Monday 4/4 - s/p revision on 2/18 by Dr Bridgett Larsson. Coumadin managed outpt by Cardiology 2.  ESRD -  TTS [name]. HD pending tomorrow K+4.5 3.  Hypertension/volume  - 114/31 amlodipine, metop. EDW increased last week. No volume excess 4.  Anemia  - hgb 10.8 Micera due 4/5- will need to order if still here. No Fe.  5.  Metabolic bone disease -  Last phos 5.7 and PTH 463 cont sensipar/phoslo/calitriol 6.  Nutrition - Alb 2.9 renal diet.  7. Liver transplant- on immunosuppression - per primary 8. DM- per primary  Shelle Iron, NP [facility] [phone] 05/12/2014, 1:05 PM   Patient seen and examined, agree with above note with above modifications. ESRD- known to [place]- issue with bleeding form AVG- sometimes  spontaneously- had intervention in Feb still and issue- planning for shuntogram now Monday due to elevated INR.  Will plan for HD in AM- FFP possibly then Corliss Parish, MD 05/12/2014

## 2014-05-13 DIAGNOSIS — Z944 Liver transplant status: Secondary | ICD-10-CM | POA: Diagnosis not present

## 2014-05-13 DIAGNOSIS — N2581 Secondary hyperparathyroidism of renal origin: Secondary | ICD-10-CM | POA: Diagnosis present

## 2014-05-13 DIAGNOSIS — E039 Hypothyroidism, unspecified: Secondary | ICD-10-CM | POA: Diagnosis present

## 2014-05-13 DIAGNOSIS — Z7982 Long term (current) use of aspirin: Secondary | ICD-10-CM | POA: Diagnosis not present

## 2014-05-13 DIAGNOSIS — T82838A Hemorrhage of vascular prosthetic devices, implants and grafts, initial encounter: Secondary | ICD-10-CM | POA: Diagnosis not present

## 2014-05-13 DIAGNOSIS — I2510 Atherosclerotic heart disease of native coronary artery without angina pectoris: Secondary | ICD-10-CM | POA: Diagnosis present

## 2014-05-13 DIAGNOSIS — F17210 Nicotine dependence, cigarettes, uncomplicated: Secondary | ICD-10-CM | POA: Diagnosis present

## 2014-05-13 DIAGNOSIS — I120 Hypertensive chronic kidney disease with stage 5 chronic kidney disease or end stage renal disease: Secondary | ICD-10-CM | POA: Diagnosis present

## 2014-05-13 DIAGNOSIS — Y832 Surgical operation with anastomosis, bypass or graft as the cause of abnormal reaction of the patient, or of later complication, without mention of misadventure at the time of the procedure: Secondary | ICD-10-CM | POA: Diagnosis present

## 2014-05-13 DIAGNOSIS — I350 Nonrheumatic aortic (valve) stenosis: Secondary | ICD-10-CM | POA: Diagnosis present

## 2014-05-13 DIAGNOSIS — Z952 Presence of prosthetic heart valve: Secondary | ICD-10-CM | POA: Diagnosis not present

## 2014-05-13 DIAGNOSIS — J449 Chronic obstructive pulmonary disease, unspecified: Secondary | ICD-10-CM | POA: Diagnosis present

## 2014-05-13 DIAGNOSIS — Z79899 Other long term (current) drug therapy: Secondary | ICD-10-CM | POA: Diagnosis not present

## 2014-05-13 DIAGNOSIS — Z794 Long term (current) use of insulin: Secondary | ICD-10-CM | POA: Diagnosis not present

## 2014-05-13 DIAGNOSIS — N186 End stage renal disease: Secondary | ICD-10-CM | POA: Diagnosis not present

## 2014-05-13 DIAGNOSIS — E119 Type 2 diabetes mellitus without complications: Secondary | ICD-10-CM | POA: Diagnosis present

## 2014-05-13 DIAGNOSIS — J45909 Unspecified asthma, uncomplicated: Secondary | ICD-10-CM | POA: Diagnosis present

## 2014-05-13 DIAGNOSIS — I739 Peripheral vascular disease, unspecified: Secondary | ICD-10-CM | POA: Diagnosis present

## 2014-05-13 DIAGNOSIS — Z7901 Long term (current) use of anticoagulants: Secondary | ICD-10-CM | POA: Diagnosis not present

## 2014-05-13 DIAGNOSIS — E1129 Type 2 diabetes mellitus with other diabetic kidney complication: Secondary | ICD-10-CM | POA: Diagnosis not present

## 2014-05-13 DIAGNOSIS — F419 Anxiety disorder, unspecified: Secondary | ICD-10-CM | POA: Diagnosis present

## 2014-05-13 DIAGNOSIS — D696 Thrombocytopenia, unspecified: Secondary | ICD-10-CM | POA: Diagnosis present

## 2014-05-13 DIAGNOSIS — F329 Major depressive disorder, single episode, unspecified: Secondary | ICD-10-CM | POA: Diagnosis present

## 2014-05-13 DIAGNOSIS — Z8249 Family history of ischemic heart disease and other diseases of the circulatory system: Secondary | ICD-10-CM | POA: Diagnosis not present

## 2014-05-13 DIAGNOSIS — Z992 Dependence on renal dialysis: Secondary | ICD-10-CM | POA: Diagnosis not present

## 2014-05-13 DIAGNOSIS — K219 Gastro-esophageal reflux disease without esophagitis: Secondary | ICD-10-CM | POA: Diagnosis present

## 2014-05-13 DIAGNOSIS — K5900 Constipation, unspecified: Secondary | ICD-10-CM | POA: Diagnosis present

## 2014-05-13 DIAGNOSIS — Z951 Presence of aortocoronary bypass graft: Secondary | ICD-10-CM | POA: Diagnosis not present

## 2014-05-13 LAB — GLUCOSE, CAPILLARY
GLUCOSE-CAPILLARY: 211 mg/dL — AB (ref 70–99)
Glucose-Capillary: 131 mg/dL — ABNORMAL HIGH (ref 70–99)
Glucose-Capillary: 134 mg/dL — ABNORMAL HIGH (ref 70–99)
Glucose-Capillary: 171 mg/dL — ABNORMAL HIGH (ref 70–99)
Glucose-Capillary: 180 mg/dL — ABNORMAL HIGH (ref 70–99)

## 2014-05-13 LAB — RENAL FUNCTION PANEL
ALBUMIN: 2.7 g/dL — AB (ref 3.5–5.2)
ANION GAP: 12 (ref 5–15)
BUN: 54 mg/dL — ABNORMAL HIGH (ref 6–23)
CHLORIDE: 99 mmol/L (ref 96–112)
CO2: 28 mmol/L (ref 19–32)
Calcium: 8.1 mg/dL — ABNORMAL LOW (ref 8.4–10.5)
Creatinine, Ser: 9.25 mg/dL — ABNORMAL HIGH (ref 0.50–1.10)
GFR calc Af Amer: 5 mL/min — ABNORMAL LOW (ref >90)
GFR, EST NON AFRICAN AMERICAN: 4 mL/min — AB (ref >90)
GLUCOSE: 115 mg/dL — AB (ref 70–99)
POTASSIUM: 4.9 mmol/L (ref 3.5–5.1)
Phosphorus: 7.3 mg/dL — ABNORMAL HIGH (ref 2.3–4.6)
SODIUM: 139 mmol/L (ref 135–145)

## 2014-05-13 LAB — CBC
HCT: 30.8 % — ABNORMAL LOW (ref 36.0–46.0)
Hemoglobin: 9.9 g/dL — ABNORMAL LOW (ref 12.0–15.0)
MCH: 31 pg (ref 26.0–34.0)
MCHC: 32.1 g/dL (ref 30.0–36.0)
MCV: 96.6 fL (ref 78.0–100.0)
PLATELETS: 112 10*3/uL — AB (ref 150–400)
RBC: 3.19 MIL/uL — AB (ref 3.87–5.11)
RDW: 16.2 % — ABNORMAL HIGH (ref 11.5–15.5)
WBC: 5.7 10*3/uL (ref 4.0–10.5)

## 2014-05-13 LAB — PROTIME-INR
INR: 2.6 — AB (ref 0.00–1.49)
PROTHROMBIN TIME: 28.1 s — AB (ref 11.6–15.2)

## 2014-05-13 MED ORDER — MORPHINE SULFATE 2 MG/ML IJ SOLN
INTRAMUSCULAR | Status: AC
  Filled 2014-05-13: qty 1

## 2014-05-13 MED ORDER — OXYCODONE HCL 5 MG PO TABS
ORAL_TABLET | ORAL | Status: AC
  Filled 2014-05-13: qty 1

## 2014-05-13 MED ORDER — SODIUM CHLORIDE 0.9 % IV SOLN: Freq: Once | INTRAVENOUS | Status: DC

## 2014-05-13 NOTE — Plan of Care (Signed)
Problem: Phase I Progression Outcomes Goal: Voiding-avoid urinary catheter unless indicated Outcome: Not Applicable Date Met:  34/62/19 anuric

## 2014-05-13 NOTE — Progress Notes (Signed)
[place] for heparin Indication: mechanical AVR  Allergies  Allergen Reactions  . Acetaminophen Other (See Comments)    Liver transplant recipient   . Codeine Itching  . Mirtazapine Other (See Comments)    hallucination  . Morphine Itching    Patient Measurements: Height: 5\' 4"  (162.6 cm) Weight: 172 lb 6.4 oz (78.2 kg) IBW/kg (Calculated) : 54.7   Vital Signs: Temp: 97.6 F (36.4 C) (04/02 1207) Temp Source: Oral (04/02 1207) BP: 106/52 mmHg (04/02 1207) Pulse Rate: 72 (04/02 1207)  Labs:  Recent Labs  05/11/14 2226 05/12/14 0450 05/13/14 0508 05/13/14 0846 05/13/14 0847  HGB 10.8*  --   --   --  9.9*  HCT 34.4*  --   --   --  30.8*  PLT 119*  --   --   --  112*  LABPROT 25.5* 26.0* 28.1*  --   --   INR 2.30* 2.36* 2.60*  --   --   CREATININE 5.87*  --   --  9.25*  --     Estimated Creatinine Clearance: 6.6 mL/min (by C-G formula based on Cr of 9.25).   Medical History: Past Medical History  Diagnosis Date  . 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 Chronic back pain   . CAD (coronary artery disease)   . Obesity   . Peripheral vascular disease hands and legs  . Anxiety   . Asthma   . GERD (gastroesophageal reflux disease)     takes Omeprazole daily  . Chronic constipation     takes MIralax and Colace daily  . Anemia     takes Folic Acid daily  . Hypothyroidism     takes Synthroid daily  . Depression     takes Cymbalta for "severe" depression  . Neuromuscular disorder     carpal tunnel in right hand  . Hypertension     takes Metoprolol and Lisinopril daily, sees Dr Bea Graff  . COPD (chronic obstructive pulmonary disease)   . Pneumonia     "today and several times before" (08/30/2012)  . Chronic bronchitis     "q yr w/season changes" (08/30/2012)  . Type II diabetes mellitus     Levemir 2units daily if > 150  . History of blood  transfusion     "several" (08/30/2012)  . Hepatitis C   . Migraine     "last migraine was in 2013" (08/30/2012)  . Headache     "at least monthly" (08/30/2012)  . Arthritis     "left hand, back" (08/30/2012)  . End stage renal disease on dialysis 02/27/2011    Kidneys shut down at time of liver transplant in Sept 2011 at [facility] in [place], she has been on HD ever since.  Dialyzes at [facility] HD on TTS schedule.  Had L forearm graft used 10 months then removed Dec 2012 due to suspected infection.  A right upper arm AVG was placed Dec 2012 but she developed steal symptoms acutely and it was ligated the same day.  Never had an AV fistula due to small veins.  Now has L thigh AVG put in Jan 2013, has not clotted to date.    [name] CAD (coronary artery disease) Jan. 2015    Cath: 20% LAD, 50% D1; s/p LIMA-LAD  . Aortic stenosis   . Tobacco abuse   .  S/P aortic valve replacement 03/15/13    Mechanical     Assessment: 59 yo lady to start heparin when INR <1.7.  Her INR this am is up to 2.6. No bleeding noted.  Will continue to hold heparin for now and let INR drift down. 1 unit FFP given in HD this am. Will follow up INR in am, for shuntogram 4/4.  Goal of Therapy:  Heparin level 0.3-0.7 units/ml Monitor platelets by anticoagulation protocol: Yes   Plan:  Will f/u INR tomorrow and start heparin when INR <1.7  Erin Hearing PharmD., BCPS Clinical Pharmacist Pager [phone] 05/13/2014 1:51 PM

## 2014-05-13 NOTE — Procedures (Signed)
Patient was seen on dialysis and the procedure was supervised.  BFR 350  Via thigh AVG BP is  115/59.   Patient appears to be tolerating treatment well- giving FFP for elevated INR and planned procedure on AVG on Monday   [name] A 05/13/2014

## 2014-05-13 NOTE — Progress Notes (Signed)
Subjective:   Seen on HD- c/o pain in [place]- says is carpal tunnel- with some radiation sxms from neck - is not new   Objective Filed Vitals:   05/13/14 0358 05/13/14 0743 05/13/14 0753 05/13/14 0815  BP: 136/48 179/56 172/61 149/64  Pulse: 70 69 65 60  Temp: 98.2 F (36.8 C) 97.7 F (36.5 C)    TempSrc: Oral Oral    Resp: 18 20  18   Height:      Weight: 80.287 kg (177 lb) 80.2 kg (176 lb 12.9 oz)    SpO2: 100% 100%     Physical Exam General: alert and oriented. No acute distress. Heart: RRR Lungs: CTA, unlabored Abdomen: soft, nontender +BS  Extremities: no edema Dialysis Access: L thigh AVG patent on HD  Assessment/Plan: 1. bleeding AVG- VVS following. shuntogram w possible intervention cancelled Friday d/t INR 2.36, coumadin has been held x 5 days and on lovenox in the interim-INR now 2.60 this AM- will give FFP on HD. Shuntogram now planned for Monday 4/4 - s/p revision on 2/18 by Dr Bridgett Larsson. Coumadin managed outpt by Cardiology 2. ESRD - TTS [name]. HD today. Monitor for bleeding post HD- giving FFP may help  3. Hypertension/volume - 149/64 amlodipine, metop. EDW increased last week. No volume excess 4. Anemia - hgb 10.8 Micera due 4/5- will need to order if still here. No Fe.  5. Metabolic bone disease - Last phos 5.7 and PTH 463 cont sensipar/phoslo/calitriol 6. Nutrition - Alb 2.9 renal diet.  7. Liver transplant- on immunosuppression - per primary 8. DM- per primary  Shelle Iron, NP [facility] Beeper [phone] 05/13/2014,8:34 AM   Patient seen and examined, agree with above note with above modifications. Give FFP with HD today to get her more prepared for procedure on Monday - check INR in AM Corliss Parish, MD 05/13/2014        Additional Objective Labs: Basic Metabolic Panel:  Recent Labs Lab 05/11/14 2226  NA 140  K 4.5  CL 96  CO2 31  GLUCOSE 162*  BUN 23  CREATININE 5.87*  CALCIUM 8.6   Liver Function Tests:  Recent  Labs Lab 05/11/14 2226  AST 37  ALT 22  ALKPHOS 91  BILITOT 0.6  PROT 7.2  ALBUMIN 2.9*   No results for input(s): LIPASE, AMYLASE in the last 168 hours. CBC:  Recent Labs Lab 05/11/14 2226  WBC 4.9  HGB 10.8*  HCT 34.4*  MCV 98.9  PLT 119*   Blood Culture    Component Value Date/Time   SDES BLOOD RIGHT HAND 07/04/2013 0024   SPECREQUEST BOTTLES DRAWN AEROBIC ONLY 1CC 07/04/2013 0024   CULT  07/04/2013 0024    NO GROWTH 5 DAYS Performed at [place] 07/10/2013 FINAL 07/04/2013 0024    Cardiac Enzymes: No results for input(s): CKTOTAL, CKMB, CKMBINDEX, TROPONINI in the last 168 hours. CBG:  Recent Labs Lab 05/12/14 1129 05/12/14 1614 05/12/14 2017 05/13/14 0010 05/13/14 0302  GLUCAP 140* 128* 175* 131* 134*   Iron Studies: No results for input(s): IRON, TIBC, TRANSFERRIN, FERRITIN in the last 72 hours. @lablastinr3 @ Studies/Results: Dg Chest Port 1 View  05/11/2014   CLINICAL DATA:  Preop for vascular surgery.  EXAM: PORTABLE CHEST - 1 VIEW  COMPARISON:  03/19/2014  FINDINGS: The heart is borderline enlarged but stable. Stable surgical changes with median sternotomy wires. The lungs are clear. No pleural effusion. The bony thorax is intact.  IMPRESSION: No acute cardiopulmonary findings.  Electronically Signed   By: Marijo Sanes M.D.   On: 05/11/2014 19:30   Medications:   . sodium chloride   Intravenous Once  . sodium chloride   Intravenous Once  . amLODipine  2.5 mg Oral Daily  . aspirin EC  81 mg Oral Daily  . calcitRIOL  0.5 mcg Oral Once per day on Tue Thu Sat  . calcium acetate  2,001 mg Oral TID WC  . cinacalcet  30 mg Oral Q breakfast  . docusate sodium  100 mg Oral BID  . gabapentin  300 mg Oral QHS  . insulin aspart  0-15 Units Subcutaneous 6 times per day  . levothyroxine  150 mcg Oral QAC breakfast  . magnesium oxide  400 mg Oral Daily  . metoprolol tartrate  25 mg Oral BID  . nicotine  21 mg Transdermal Daily  .  pantoprazole  40 mg Oral Daily  . sodium chloride  3 mL Intravenous Q12H  . tacrolimus  3 mg Oral BID

## 2014-05-13 NOTE — Progress Notes (Signed)
UR completed 

## 2014-05-13 NOTE — Progress Notes (Signed)
Subjective: Interval History: none.. Comfortable. Currently on hemodialysis with no bleeding  Objective: Vital signs in last 24 hours: Temp:  [97.7 F (36.5 C)-98.5 F (36.9 C)] 97.7 F (36.5 C) (04/02 0743) Pulse Rate:  [60-75] 68 (04/02 1045) Resp:  [15-20] 20 (04/02 1045) BP: (101-179)/(31-76) 106/76 mmHg (04/02 1045) SpO2:  [99 %-100 %] 100 % (04/02 0743) Weight:  [176 lb 12.9 oz (80.2 kg)-177 lb (80.287 kg)] 176 lb 12.9 oz (80.2 kg) (04/02 0743)  Intake/Output from previous day: 04/01 0701 - 04/02 0700 In: 462 [P.O.:462] Out: -  Intake/Output this shift:    No bleeding from inside graft  Lab Results:  Recent Labs  05/11/14 2226 05/13/14 0847  WBC 4.9 5.7  HGB 10.8* 9.9*  HCT 34.4* 30.8*  PLT 119* 112*   BMET  Recent Labs  05/11/14 2226 05/13/14 0846  NA 140 139  K 4.5 4.9  CL 96 99  CO2 31 28  GLUCOSE 162* 115*  BUN 23 54*  CREATININE 5.87* 9.25*  CALCIUM 8.6 8.1*    Studies/Results: Dg Chest Port 1 View  05/11/2014   CLINICAL DATA:  Preop for vascular surgery.  EXAM: PORTABLE CHEST - 1 VIEW  COMPARISON:  03/19/2014  FINDINGS: The heart is borderline enlarged but stable. Stable surgical changes with median sternotomy wires. The lungs are clear. No pleural effusion. The bony thorax is intact.  IMPRESSION: No acute cardiopulmonary findings.   Electronically Signed   By: Marijo Sanes M.D.   On: 05/11/2014 19:30   Anti-infectives: Anti-infectives    None      Assessment/Plan: s/p Procedure(s): SHUNTOGRAM (Left) History of bleeding from thigh AV access graft. For shuntogram on Monday to rule out venous outflow stenosis. INR still elevated at 2.6. To. received fresh frozen plasma     [name] 05/13/2014, 11:08 AM

## 2014-05-14 LAB — GLUCOSE, CAPILLARY
GLUCOSE-CAPILLARY: 137 mg/dL — AB (ref 70–99)
Glucose-Capillary: 150 mg/dL — ABNORMAL HIGH (ref 70–99)
Glucose-Capillary: 169 mg/dL — ABNORMAL HIGH (ref 70–99)
Glucose-Capillary: 64 mg/dL — ABNORMAL LOW (ref 70–99)
Glucose-Capillary: 199 mg/dL — ABNORMAL HIGH (ref 70–99)
Glucose-Capillary: 85 mg/dL (ref 70–99)

## 2014-05-14 LAB — PREPARE FRESH FROZEN PLASMA
UNIT DIVISION: 0
UNIT DIVISION: 0

## 2014-05-14 LAB — PROTIME-INR
INR: 1.68 — AB (ref 0.00–1.49)
Prothrombin Time: 19.9 seconds — ABNORMAL HIGH (ref 11.6–15.2)

## 2014-05-14 LAB — HEPARIN LEVEL (UNFRACTIONATED): Heparin Unfractionated: 0.26 IU/mL — ABNORMAL LOW (ref 0.30–0.70)

## 2014-05-14 MED ORDER — HEPARIN (PORCINE) IN NACL 100-0.45 UNIT/ML-% IJ SOLN
1150.0000 [IU]/h | INTRAMUSCULAR | Status: DC
  Administered 2014-05-14: 1000 [IU]/h via INTRAVENOUS
  Administered 2014-05-15: 1150 [IU]/h via INTRAVENOUS
  Filled 2014-05-14 (×4): qty 250

## 2014-05-14 MED ORDER — CALCIUM ACETATE (PHOS BINDER) 667 MG PO CAPS
1334.0000 mg | ORAL_CAPSULE | Freq: Three times a day (TID) | ORAL | Status: DC
  Administered 2014-05-14 – 2014-05-20 (×15): 1334 mg via ORAL
  Filled 2014-05-14 (×22): qty 2

## 2014-05-14 NOTE — Progress Notes (Signed)
Subjective:   No new complaints this AM- had a little bit of bleeding post HD yest but not too much- INR down to 1.68  Objective Filed Vitals:   05/13/14 1145 05/13/14 1207 05/13/14 1959 05/14/14 0424  BP: 106/49 106/52 148/50 121/39  Pulse: 72 72 65 65  Temp: 97.2 F (36.2 C) 97.6 F (36.4 C) 97.7 F (36.5 C) 98.3 F (36.8 C)  TempSrc: Oral Oral Oral Oral  Resp: 15 15 16 16   Height:      Weight:  78.2 kg (172 lb 6.4 oz)  79.697 kg (175 lb 11.2 oz)  SpO2:  100% 100% 100%   Physical Exam General: alert and oriented. No acute distress. Heart: RRR Lungs: CTA, unlabored Abdomen: soft, nontender +BS  Extremities: no edema Dialysis Access: L thigh AVG patent on HD  Assessment/Plan: 1. bleeding AVG- VVS following. shuntogram w possible intervention cancelled Friday d/t INR 2.36, coumadin has been held x 5 days and on lovenox in the interim-INR now 2.60 yest- given FFP on HD- now 1.68. Shuntogram  planned for Monday 4/4 - s/p revision on 2/18 by Dr Bridgett Larsson. Coumadin managed outpt by Cardiology 2. ESRD - TTS [name] via thigh AVG. HD Sat. Next planned for Tuesday- either here or at [facility] 3. Hypertension/volume - controlled here-  amlodipine, metop. EDW increased last week. No volume excess 4. Anemia - hgb 10.8- now 9.9  Micera due 4/5- will need to order if still here. No Fe.  5. Metabolic bone disease - Last phos 5.7 and PTH 463 cont sensipar/phoslo/calitriol 6. Nutrition - Alb 2.9 renal diet.  7. Liver transplant- on immunosuppression - per primary 8. DM- per primary  Hartwell Vandiver A   05/14/2014,8:36 AM     LOS: 1 day    Additional Objective Labs: Basic Metabolic Panel:  Recent Labs Lab 05/11/14 2226 05/13/14 0846  NA 140 139  K 4.5 4.9  CL 96 99  CO2 31 28  GLUCOSE 162* 115*  BUN 23 54*  CREATININE 5.87* 9.25*  CALCIUM 8.6 8.1*  PHOS  --  7.3*   Liver Function Tests:  Recent Labs Lab 05/11/14 2226 05/13/14 0846  AST 37  --   ALT 22  --    ALKPHOS 91  --   BILITOT 0.6  --   PROT 7.2  --   ALBUMIN 2.9* 2.7*   No results for input(s): LIPASE, AMYLASE in the last 168 hours. CBC:  Recent Labs Lab 05/11/14 2226 05/13/14 0847  WBC 4.9 5.7  HGB 10.8* 9.9*  HCT 34.4* 30.8*  MCV 98.9 96.6  PLT 119* 112*   Blood Culture    Component Value Date/Time   SDES BLOOD RIGHT HAND 07/04/2013 0024   SPECREQUEST BOTTLES DRAWN AEROBIC ONLY 1CC 07/04/2013 0024   CULT  07/04/2013 0024    NO GROWTH 5 DAYS Performed at [facility] 07/10/2013 FINAL 07/04/2013 0024    Cardiac Enzymes: No results for input(s): CKTOTAL, CKMB, CKMBINDEX, TROPONINI in the last 168 hours. CBG:  Recent Labs Lab 05/13/14 1942 05/13/14 2011 05/13/14 2334 05/14/14 0426 05/14/14 0747  GLUCAP 211* 180* 171* 150* 64*   Iron Studies: No results for input(s): IRON, TIBC, TRANSFERRIN, FERRITIN in the last 72 hours. @lablastinr3 @ Studies/Results: No results found. Medications:   . sodium chloride   Intravenous Once  . sodium chloride   Intravenous Once  . amLODipine  2.5 mg Oral Daily  . aspirin EC  81 mg Oral Daily  . calcitRIOL  0.5 mcg Oral Once per day on Tue Thu Sat  . calcium acetate  2,001 mg Oral TID WC  . cinacalcet  30 mg Oral Q breakfast  . docusate sodium  100 mg Oral BID  . gabapentin  300 mg Oral QHS  . insulin aspart  0-15 Units Subcutaneous 6 times per day  . levothyroxine  150 mcg Oral QAC breakfast  . magnesium oxide  400 mg Oral Daily  . metoprolol tartrate  25 mg Oral BID  . nicotine  21 mg Transdermal Daily  . pantoprazole  40 mg Oral Daily  . sodium chloride  3 mL Intravenous Q12H  . tacrolimus  3 mg Oral BID

## 2014-05-14 NOTE — Progress Notes (Signed)
ANTICOAGULATION CONSULT NOTE - Follow Up Consult  Pharmacy Consult for Heparin Indication: mechanical AVR  Allergies  Allergen Reactions  . Acetaminophen Other (See Comments)    Liver transplant recipient   . Codeine Itching  . Mirtazapine Other (See Comments)    hallucination  . Morphine Itching    Patient Measurements: Height: 5\' 4"  (162.6 cm) Weight: 175 lb 11.2 oz (79.697 kg) IBW/kg (Calculated) : 54.7 Heparin Dosing Weight: 72 kg  Vital Signs: Temp: 98.2 F (36.8 C) (04/03 1357) Temp Source: Axillary (04/03 1357) BP: 125/53 mmHg (04/03 1357) Pulse Rate: 68 (04/03 1357)  Labs:  Recent Labs  05/11/14 2226 05/12/14 0450 05/13/14 0508 05/13/14 0846 05/13/14 0847 05/14/14 0605 05/14/14 1820  HGB 10.8*  --   --   --  9.9*  --   --   HCT 34.4*  --   --   --  30.8*  --   --   PLT 119*  --   --   --  112*  --   --   LABPROT 25.5* 26.0* 28.1*  --   --  19.9*  --   INR 2.30* 2.36* 2.60*  --   --  1.68*  --   HEPARINUNFRC  --   --   --   --   --   --  0.26*  CREATININE 5.87*  --   --  9.25*  --   --   --     Estimated Creatinine Clearance: 6.7 mL/min (by C-G formula based on Cr of 9.25).  Assessment:   Holding Coumadin for shuntogram on 4/4 at 1pm.   INR down to 1.68 today after FFP given on 4/2.   Heparin to begin when INR < 1.7.   No bleeding from AVG today, but noted a bit of bleeding post-HD on 4/2.  Intial HL 0.26 on 1000 units/hr. No bleeding issues noted.   Goal of Therapy:  Heparin level 0.3-0.7 units/ml Monitor platelets by anticoagulation protocol: Yes   Plan:  Increase heparin to 1150 units/hr Daily heparin level, CBC and PT/INR.  Erin Hearing PharmD., BCPS Clinical Pharmacist Pager [phone] 05/14/2014 7:08 PM

## 2014-05-14 NOTE — Progress Notes (Signed)
ANTICOAGULATION CONSULT NOTE - Follow Up Consult  Pharmacy Consult for Heparin Indication: mechanical AVR  Allergies  Allergen Reactions  . Acetaminophen Other (See Comments)    Liver transplant recipient   . Codeine Itching  . Mirtazapine Other (See Comments)    hallucination  . Morphine Itching    Patient Measurements: Height: 5\' 4"  (162.6 cm) Weight: 175 lb 11.2 oz (79.697 kg) IBW/kg (Calculated) : 54.7 Heparin Dosing Weight: 72 kg  Vital Signs: Temp: 98.3 F (36.8 C) (04/03 0424) Temp Source: Oral (04/03 0424) BP: 121/39 mmHg (04/03 0424) Pulse Rate: 65 (04/03 0424)  Labs:  Recent Labs  05/11/14 2226 05/12/14 0450 05/13/14 0508 05/13/14 0846 05/13/14 0847 05/14/14 0605  HGB 10.8*  --   --   --  9.9*  --   HCT 34.4*  --   --   --  30.8*  --   PLT 119*  --   --   --  112*  --   LABPROT 25.5* 26.0* 28.1*  --   --  19.9*  INR 2.30* 2.36* 2.60*  --   --  1.68*  CREATININE 5.87*  --   --  9.25*  --   --     Estimated Creatinine Clearance: 6.7 mL/min (by C-G formula based on Cr of 9.25).  Assessment:   Holding Coumadin for shuntogram on 4/4 at 1pm.   INR down to 1.68 today after FFP given on 4/2.   Heparin to begin when INR < 1.7.   No bleeding from AVG today, but noted a bit of bleeding post-HD on 4/2.  Goal of Therapy:  Heparin level 0.3-0.7 units/ml Monitor platelets by anticoagulation protocol: Yes   Plan:   Begin heparin drip at 1000 units/hr (~ 14 units/kg/hr)  Heparin level ~ 8hrs after drip begins.  Daily heparin level, CBC and PT/INR.   Arty Baumgartner, [place] Pager: [phone] 05/14/2014,9:02 AM

## 2014-05-14 NOTE — Progress Notes (Signed)
Subjective: Interval History: none.. Did have some oozing from puncture site at completion of dialysis yesterday  Objective: Vital signs in last 24 hours: Temp:  [97 F (36.1 C)-98.3 F (36.8 C)] 98.3 F (36.8 C) (04/03 0424) Pulse Rate:  [64-72] 64 (04/03 1009) Resp:  [15-20] 16 (04/03 0424) BP: (88-148)/(39-52) 118/48 mmHg (04/03 1009) SpO2:  [100 %] 100 % (04/03 0424) Weight:  [172 lb 6.4 oz (78.2 kg)-175 lb 11.2 oz (79.697 kg)] 175 lb 11.2 oz (79.697 kg) (04/03 0424)  Intake/Output from previous day: 04/02 0701 - 04/03 0700 In: 783 [P.O.:480; I.V.:3; Blood:300] Out: 1550  Intake/Output this shift:    No bleeding from left thigh AV graft  Lab Results:  Recent Labs  05/11/14 2226 05/13/14 0847  WBC 4.9 5.7  HGB 10.8* 9.9*  HCT 34.4* 30.8*  PLT 119* 112*   BMET  Recent Labs  05/11/14 2226 05/13/14 0846  NA 140 139  K 4.5 4.9  CL 96 99  CO2 31 28  GLUCOSE 162* 115*  BUN 23 54*  CREATININE 5.87* 9.25*  CALCIUM 8.6 8.1*    Studies/Results: Dg Chest Port 1 View  05/11/2014   CLINICAL DATA:  Preop for vascular surgery.  EXAM: PORTABLE CHEST - 1 VIEW  COMPARISON:  03/19/2014  FINDINGS: The heart is borderline enlarged but stable. Stable surgical changes with median sternotomy wires. The lungs are clear. No pleural effusion. The bony thorax is intact.  IMPRESSION: No acute cardiopulmonary findings.   Electronically Signed   By: Marijo Sanes M.D.   On: 05/11/2014 19:30   Anti-infectives: Anti-infectives    None      Assessment/Plan: s/p Procedure(s): SHUNTOGRAM (Left) INR has normalized. For left shuntogram tomorrow   LOS: 1 day   [name] 05/14/2014, 11:27 AM

## 2014-05-15 ENCOUNTER — Encounter (HOSPITAL_COMMUNITY)
Admission: EM | Disposition: A | Discharge status: Home / Self Care | Payer: Self-pay | Source: Home / Self Care | Attending: Vascular Surgery

## 2014-05-15 ENCOUNTER — Encounter (HOSPITAL_COMMUNITY): Payer: Self-pay | Admitting: Vascular Surgery

## 2014-05-15 DIAGNOSIS — N186 End stage renal disease: Secondary | ICD-10-CM

## 2014-05-15 DIAGNOSIS — T82838A Hemorrhage of vascular prosthetic devices, implants and grafts, initial encounter: Secondary | ICD-10-CM

## 2014-05-15 HISTORY — PX: SHUNTOGRAM: SHX5491

## 2014-05-15 LAB — PROTIME-INR
INR: 1.85 — ABNORMAL HIGH (ref 0.00–1.49)
Prothrombin Time: 21.5 seconds — ABNORMAL HIGH (ref 11.6–15.2)

## 2014-05-15 LAB — CBC
HCT: 30.9 % — ABNORMAL LOW (ref 36.0–46.0)
Hemoglobin: 9.7 g/dL — ABNORMAL LOW (ref 12.0–15.0)
MCH: 31.2 pg (ref 26.0–34.0)
MCHC: 31.4 g/dL (ref 30.0–36.0)
MCV: 99.4 fL (ref 78.0–100.0)
Platelets: 59 K/uL — ABNORMAL LOW (ref 150–400)
RBC: 3.11 MIL/uL — ABNORMAL LOW (ref 3.87–5.11)
RDW: 16.2 % — ABNORMAL HIGH (ref 11.5–15.5)
WBC: 6.6 K/uL (ref 4.0–10.5)

## 2014-05-15 LAB — GLUCOSE, CAPILLARY
GLUCOSE-CAPILLARY: 101 mg/dL — AB (ref 70–99)
GLUCOSE-CAPILLARY: 142 mg/dL — AB (ref 70–99)
GLUCOSE-CAPILLARY: 175 mg/dL — AB (ref 70–99)
Glucose-Capillary: 110 mg/dL — ABNORMAL HIGH (ref 70–99)
Glucose-Capillary: 147 mg/dL — ABNORMAL HIGH (ref 70–99)

## 2014-05-15 LAB — HEPARIN LEVEL (UNFRACTIONATED): Heparin Unfractionated: 0.47 [IU]/mL (ref 0.30–0.70)

## 2014-05-15 SURGERY — ASSESSMENT, SHUNT FUNCTION, WITH CONTRAST RADIOGRAPHIC STUDY
Laterality: Left

## 2014-05-15 MED ORDER — ALTEPLASE 2 MG IJ SOLR
2.0000 mg | Freq: Once | INTRAMUSCULAR | Status: AC | PRN
  Filled 2014-05-15: qty 2

## 2014-05-15 MED ORDER — LIDOCAINE HCL (PF) 1 % IJ SOLN: 5.0000 mL | INTRAMUSCULAR | Status: DC | PRN

## 2014-05-15 MED ORDER — LIDOCAINE-PRILOCAINE 2.5-2.5 % EX CREA
1.0000 "application " | TOPICAL_CREAM | CUTANEOUS | Status: DC | PRN
  Filled 2014-05-15: qty 5

## 2014-05-15 MED ORDER — SODIUM CHLORIDE 0.9 % IV SOLN: 250.0000 mL | INTRAVENOUS | Status: DC | PRN

## 2014-05-15 MED ORDER — HEPARIN (PORCINE) IN NACL 100-0.45 UNIT/ML-% IJ SOLN
1100.0000 [IU]/h | INTRAMUSCULAR | Status: DC
  Administered 2014-05-15 – 2014-05-16 (×2): 1100 [IU]/h via INTRAVENOUS
  Filled 2014-05-15 (×3): qty 250

## 2014-05-15 MED ORDER — SODIUM CHLORIDE 0.9 % IV SOLN: 100.0000 mL | INTRAVENOUS | Status: DC | PRN

## 2014-05-15 MED ORDER — SODIUM CHLORIDE 0.9 % IJ SOLN
3.0000 mL | Freq: Two times a day (BID) | INTRAMUSCULAR | Status: DC
  Administered 2014-05-15 – 2014-05-19 (×7): 3 mL via INTRAVENOUS

## 2014-05-15 MED ORDER — SODIUM CHLORIDE 0.9 % IJ SOLN: 3.0000 mL | INTRAMUSCULAR | Status: DC | PRN

## 2014-05-15 MED ORDER — ONDANSETRON HCL 4 MG/2ML IJ SOLN: 4.0000 mg | Freq: Four times a day (QID) | INTRAMUSCULAR | Status: DC | PRN

## 2014-05-15 MED ORDER — WARFARIN - PHARMACIST DOSING INPATIENT
Freq: Every day | Status: DC
  Administered 2014-05-15 – 2014-05-19 (×4)

## 2014-05-15 MED ORDER — HEPARIN SODIUM (PORCINE) 1000 UNIT/ML DIALYSIS
1000.0000 [IU] | INTRAMUSCULAR | Status: DC | PRN
  Filled 2014-05-15: qty 1

## 2014-05-15 MED ORDER — WARFARIN SODIUM 4 MG PO TABS
4.0000 mg | ORAL_TABLET | Freq: Once | ORAL | Status: AC
  Administered 2014-05-15: 4 mg via ORAL
  Filled 2014-05-15: qty 1

## 2014-05-15 MED ORDER — PENTAFLUOROPROP-TETRAFLUOROETH EX AERO: 1.0000 "application " | INHALATION_SPRAY | CUTANEOUS | Status: DC | PRN

## 2014-05-15 MED ORDER — NEPRO/CARBSTEADY PO LIQD
237.0000 mL | ORAL | Status: DC | PRN
  Filled 2014-05-15: qty 237

## 2014-05-15 NOTE — Progress Notes (Signed)
[facility] Progress Note   Subjective: feeling well, no compliants  Filed Vitals:   05/14/14 1009 05/14/14 1357 05/14/14 2016 05/15/14 0616  BP: 118/48 125/53 137/39 151/49  Pulse: 64 68 62 66  Temp:  98.2 F (36.8 C) 98.2 F (36.8 C) 98.2 F (36.8 C)  TempSrc:  Axillary Oral Oral  Resp:  18 18 18   Height:      Weight:    81.5 kg (179 lb 10.8 oz)  SpO2:  100% 100% 100%   Exam: General: alert and oriented. No acute distress. Heart: RRR Lungs: CTA, unlabored Abdomen: soft, nontender +BS  Extremities: no edema Dialysis Access: L thigh AVG patent on HD  HD: TTS [name] 3hr 45 mins 78 kgs 2K/2.25Ca 400/1.5 No heparin Micera 75 q 2 weeks(last 3/22- next dose due 4/5) Calcitriol 0.5   Assessment: 1. Bleeding AVG- VVS following. shuntogram w possible intervention cancelled Friday d/t INR 2.36, coumadin has been held x 5 days and on lovenox in the interim-INR now 2.60 yest- given FFP on HD- now 1.68. Shuntogram planned for Monday 4/4 - s/p revision on 2/18 by Dr Bridgett Larsson. Coumadin managed outpt by Cardiology 2. ESRD - TTS [name] via thigh AVG. HD Sat. Next planned for Tuesday- either here or at [facility] 3. Hypertension/volume - controlled here- amlodipine, metop. EDW increased last week. No volume excess 4. Anemia - hgb 10.8- now 9.9 Micera due 4/5- will need to order if still here. No Fe.  5. Metabolic bone disease - Last phos 5.7 and PTH 463 cont sensipar/phoslo/calitriol 6. Nutrition - Alb 2.9 renal diet.  7. Liver transplant- on immunosuppression - per primary 8. DM- per primary  Plan- HD tomorrow    [name] MD  pager [phone]    cell [phone]  05/15/2014, 8:25 AM     Recent Labs Lab 05/11/14 2226 05/13/14 0846  NA 140 139  K 4.5 4.9  CL 96 99  CO2 31 28  GLUCOSE 162* 115*  BUN 23 54*  CREATININE 5.87* 9.25*  CALCIUM 8.6 8.1*  PHOS  --  7.3*    Recent Labs Lab 05/11/14 2226 05/13/14 0846  AST 37  --   ALT 22  --    ALKPHOS 91  --   BILITOT 0.6  --   PROT 7.2  --   ALBUMIN 2.9* 2.7*    Recent Labs Lab 05/11/14 2226 05/13/14 0847 05/15/14 0352  WBC 4.9 5.7 6.6  HGB 10.8* 9.9* 9.7*  HCT 34.4* 30.8* 30.9*  MCV 98.9 96.6 99.4  PLT 119* 112* 59*   . sodium chloride   Intravenous Once  . sodium chloride   Intravenous Once  . amLODipine  2.5 mg Oral Daily  . aspirin EC  81 mg Oral Daily  . calcitRIOL  0.5 mcg Oral Once per day on Tue Thu Sat  . calcium acetate  1,334 mg Oral TID WC  . cinacalcet  30 mg Oral Q breakfast  . docusate sodium  100 mg Oral BID  . gabapentin  300 mg Oral QHS  . insulin aspart  0-15 Units Subcutaneous 6 times per day  . levothyroxine  150 mcg Oral QAC breakfast  . magnesium oxide  400 mg Oral Daily  . metoprolol tartrate  25 mg Oral BID  . nicotine  21 mg Transdermal Daily  . pantoprazole  40 mg Oral Daily  . sodium chloride  3 mL Intravenous Q12H  . tacrolimus  3 mg Oral BID   . heparin  1,150 Units/hr (05/14/14 1926)   sodium chloride, albuterol, calcium acetate, diphenhydrAMINE, hydrALAZINE, labetalol, metoprolol, morphine injection, nitroGLYCERIN, ondansetron, ondansetron, oxyCODONE, sodium chloride, traMADol

## 2014-05-15 NOTE — Progress Notes (Addendum)
ANTICOAGULATION CONSULT NOTE - Follow Up Consult  Pharmacy Consult for Heparin Indication: mechanical AVR  Allergies  Allergen Reactions  . Acetaminophen Other (See Comments)    Liver transplant recipient   . Codeine Itching  . Mirtazapine Other (See Comments)    hallucination  . Morphine Itching    Patient Measurements: Height: 5\' 4"  (162.6 cm) Weight: 179 lb 10.8 oz (81.5 kg) IBW/kg (Calculated) : 54.7 Heparin Dosing Weight: 72 kg  Vital Signs: Temp: 98.1 F (36.7 C) (04/04 1310) Temp Source: Oral (04/04 1310) BP: 120/35 mmHg (04/04 1310) Pulse Rate: 60 (04/04 1310)  Labs:  Recent Labs  05/13/14 0508 05/13/14 0846 05/13/14 0847 05/14/14 0605 05/14/14 1820 05/15/14 0352  HGB  --   --  9.9*  --   --  9.7*  HCT  --   --  30.8*  --   --  30.9*  PLT  --   --  112*  --   --  59*  LABPROT 28.1*  --   --  19.9*  --  21.5*  INR 2.60*  --   --  1.68*  --  1.85*  HEPARINUNFRC  --   --   --   --  0.26* 0.47  CREATININE  --  9.25*  --   --   --   --     Estimated Creatinine Clearance: 6.8 mL/min (by C-G formula based on Cr of 9.25).  Assessment: 59 yo F on Coumadin PTA for mechanical AVR.  Coumadin on hold for shuntogram on 4/4 at 1pm.  INR 1.85.  Pt being bridged with heparin therapy.  No bleeding from AVG today, but noted a bit of bleeding post-HD on 4/2.  Heparin level is therapeutic on 1150 units/hr.  Heparin is currently off in anticipation of OR this afternoon.  Goal of Therapy:  Heparin level 0.3-0.7 units/ml Monitor platelets by anticoagulation protocol: Yes   Plan:  Follow-up heparin plans post-op.  [employer], Pharm.D., BCPS Clinical Pharmacist Pager [phone] 05/15/2014 1:52 PM  Addendum: Pt had shuntogram this afternoon. Pharmacy is consulted to resume coumadin tonight with heparin bridge starting 8 hrs after sheath removal (1422). INR 1.85 today, previously heparin level therapeutic on 1150 units/hr hgb stable, but plt dropped from 112 to 59 K.  Noted thrombocytopenia at baseline.   Plan: - Restart heparin infusion with no bolus 1100 units/hr at 2230 - f/u 8 hr heparin level tomorrow AM at 0630 - Coumadin 4mg  po x 1 tonight - Daily heparin level, PT/INR and CBC  Maryanna Shape, PharmD, BCPS  Clinical Pharmacist  Pager: [phone]

## 2014-05-15 NOTE — Interval H&P Note (Signed)
History and Physical Interval Note:  05/15/2014 10:25 AM  [name]  has presented today for surgery, with the diagnosis of instage renal  The various methods of treatment have been discussed with the patient and family. After consideration of risks, benefits and other options for treatment, the patient has consented to  Procedure(s): SHUNTOGRAM (Left) as a surgical intervention .  The patient's history has been reviewed, patient examined, no change in status, stable for surgery.  I have reviewed the patient's chart and labs.  Questions were answered to the patient's satisfaction.     Kostas Marrow [id number]

## 2014-05-15 NOTE — Op Note (Signed)
     OPERATIVE NOTE   PROCEDURE: 1.  left thigh arteriovenous graft cannulation under ultrasound guidance 2.  left thigh shuntogram 3.  Venoplasty of common vein (6 mm x 60 mm)  PRE-OPERATIVE DIAGNOSIS: Excess bleeding from left thigh arteriovenous graft   POST-OPERATIVE DIAGNOSIS: same as above   SURGEON: Adele Barthel, MD  ANESTHESIA: local  ESTIMATED BLOOD LOSS: 5 cc  FINDING(S): 1. Patent arteriovenous graft throughout 2. Venous anastomosis: 2.5 mm -> 5.0 mm after serial venoplasty 3. Patent distal inferior vena cava and widely patent left common iliac vein 4. Left external iliac vein ~6-7 mm in diameter  SPECIMEN(S):  None  CONTRAST: 35 cc  INDICATIONS: [name] is a 59 y.o. female who presents with profuse bleeding from left thigh arteriovenous graft.  The patient is scheduled for left thigh shuntogram, possible intervention.  The patient is aware the risks include but are not limited to: bleeding, infection, thrombosis of the cannulated access, and possible anaphylactic reaction to the contrast.  The patient is aware of the risks of the procedure and elects to proceed forward.  DESCRIPTION: After full informed written consent was obtained, the patient was brought back to the angiography suite and placed supine upon the angiography table.  The patient was connected to monitoring equipment.  The left thigh was prepped and draped in the standard fashion for a percutaneous access intervention.  Under ultrasound guidance, the left thigh arteriovenous graft was cannulated with a micropuncture needle.  The microwire was advanced into the fistula and the needle was exchanged for the a microsheath, which was lodged 2 cm into the access.  The wire was removed and the sheath was connected to the IV extension tubing.  Hand injections were completed to image the access from the thigh up to the level of inferior vena cava.  Based on the images, this patient will need: venoplasty of  left common femoral vein.  A Benson wire was advanced into the axillary vein and the sheath was exchanged for a short 6-Fr sheath.  Based on the imaging, a 6 mm x 60 mm angioplasty balloon was selected.  The balloon was centered around the stenosis and inflated to 18 ATM for 2 minutes.  On completion imaging, a >30% residual stenosis was present.  The balloon was replaced and centered around the stenosis and inflated to 22 ATM for 2 minutes.  On completion imaging, the lumen improved from 2.5 mm to 5.0 mm in diameter.  Based on the completion imaging, no further intervention is necessary.  The wire and balloon were removed from the sheath.  A 4-0 Monocryl purse-string suture was sewn around the sheath.  The sheath was removed while tying down the suture.  A sterile bandage was applied to the puncture site.  COMPLICATIONS: none  CONDITION: stable   Adele Barthel, MD [facility] [place] Office: [phone] Pager: [phone]  05/15/2014 2:33 PM

## 2014-05-15 NOTE — Progress Notes (Signed)
Medicare Important Message given? YES  Date Medicare IM given:  05/15/2014 Medicare IM given by: Jonnie Finner

## 2014-05-16 LAB — GLUCOSE, CAPILLARY
GLUCOSE-CAPILLARY: 125 mg/dL — AB (ref 70–99)
Glucose-Capillary: 120 mg/dL — ABNORMAL HIGH (ref 70–99)
Glucose-Capillary: 145 mg/dL — ABNORMAL HIGH (ref 70–99)
Glucose-Capillary: 163 mg/dL — ABNORMAL HIGH (ref 70–99)
Glucose-Capillary: 176 mg/dL — ABNORMAL HIGH (ref 70–99)

## 2014-05-16 LAB — RENAL FUNCTION PANEL
Albumin: 2.6 g/dL — ABNORMAL LOW (ref 3.5–5.2)
Anion gap: 12 (ref 5–15)
BUN: 63 mg/dL — ABNORMAL HIGH (ref 6–23)
CALCIUM: 8 mg/dL — AB (ref 8.4–10.5)
CO2: 25 mmol/L (ref 19–32)
Chloride: 99 mmol/L (ref 96–112)
Creatinine, Ser: 10.35 mg/dL — ABNORMAL HIGH (ref 0.50–1.10)
GFR, EST AFRICAN AMERICAN: 4 mL/min — AB (ref >90)
GFR, EST NON AFRICAN AMERICAN: 4 mL/min — AB (ref >90)
Glucose, Bld: 89 mg/dL (ref 70–99)
PHOSPHORUS: 6.1 mg/dL — AB (ref 2.3–4.6)
Potassium: 5.4 mmol/L — ABNORMAL HIGH (ref 3.5–5.1)
SODIUM: 136 mmol/L (ref 135–145)

## 2014-05-16 LAB — HEPATIC FUNCTION PANEL
ALT: 17 U/L (ref 0–35)
AST: 26 U/L (ref 0–37)
Albumin: 2.6 g/dL — ABNORMAL LOW (ref 3.5–5.2)
Alkaline Phosphatase: 78 U/L (ref 39–117)
Bilirubin, Direct: 0.1 mg/dL (ref 0.0–0.5)
Indirect Bilirubin: 0.5 mg/dL (ref 0.3–0.9)
TOTAL PROTEIN: 6.7 g/dL (ref 6.0–8.3)
Total Bilirubin: 0.6 mg/dL (ref 0.3–1.2)

## 2014-05-16 LAB — PROTIME-INR
INR: 1.88 — ABNORMAL HIGH (ref 0.00–1.49)
PROTHROMBIN TIME: 21.8 s — AB (ref 11.6–15.2)

## 2014-05-16 LAB — CBC
HEMATOCRIT: 29.4 % — AB (ref 36.0–46.0)
HEMOGLOBIN: 9.5 g/dL — AB (ref 12.0–15.0)
MCH: 30.7 pg (ref 26.0–34.0)
MCHC: 32.3 g/dL (ref 30.0–36.0)
MCV: 95.1 fL (ref 78.0–100.0)
Platelets: 125 10*3/uL — ABNORMAL LOW (ref 150–400)
RBC: 3.09 MIL/uL — AB (ref 3.87–5.11)
RDW: 15.7 % — ABNORMAL HIGH (ref 11.5–15.5)
WBC: 6.5 10*3/uL (ref 4.0–10.5)

## 2014-05-16 LAB — HEPARIN LEVEL (UNFRACTIONATED): HEPARIN UNFRACTIONATED: 0.32 [IU]/mL (ref 0.30–0.70)

## 2014-05-16 MED ORDER — NEPRO/CARBSTEADY PO LIQD
237.0000 mL | Freq: Two times a day (BID) | ORAL | Status: DC
  Administered 2014-05-16 (×2): 237 mL via ORAL
  Filled 2014-05-16 (×12): qty 237

## 2014-05-16 MED ORDER — DARBEPOETIN ALFA 25 MCG/0.42ML IJ SOSY
PREFILLED_SYRINGE | INTRAMUSCULAR | Status: AC
Start: 2014-05-16 — End: 2014-05-16
  Administered 2014-05-16: 25 ug via INTRAVENOUS
  Filled 2014-05-16: qty 0.42

## 2014-05-16 MED ORDER — RENA-VITE PO TABS
1.0000 | ORAL_TABLET | Freq: Every day | ORAL | Status: DC
  Administered 2014-05-16 – 2014-05-19 (×4): 1 via ORAL
  Filled 2014-05-16 (×6): qty 1

## 2014-05-16 MED ORDER — WARFARIN SODIUM 4 MG PO TABS
4.0000 mg | ORAL_TABLET | Freq: Once | ORAL | Status: AC
  Administered 2014-05-16: 4 mg via ORAL
  Filled 2014-05-16: qty 1

## 2014-05-16 MED ORDER — DARBEPOETIN ALFA 25 MCG/0.42ML IJ SOSY
25.0000 ug | PREFILLED_SYRINGE | INTRAMUSCULAR | Status: DC
  Administered 2014-05-16: 25 ug via INTRAVENOUS

## 2014-05-16 NOTE — Progress Notes (Signed)
Dr. Trula Slade notified of second instance of L thigh AVF site bleeding. MD confirmed to orders to continue heparin gtt. Night RN aware.

## 2014-05-16 NOTE — Progress Notes (Signed)
   Daily Progress Note  Assessment/Planning: POD #1 s/p L thigh shuntogram, venoplasty CFV   No bleeding last session so it is possible that venous hypertension might have been part of the etiology for her prolonged bleeding  Obviously anticoagulation for her mechanical valve is also contributing to her bleeding  Awaiting Coumadin load before discharge (Target 2.5)  Subjective  - 1 Day Post-Op  No bleeding yesterday  Objective Filed Vitals:   05/16/14 0924 05/16/14 0930 05/16/14 0948 05/16/14 1040  BP: 100/51 91/50 124/71 138/63  Pulse: 67  67 72  Temp:   98 F (36.7 C) 98.1 F (36.7 C)  TempSrc:   Oral Oral  Resp: 14  15 18   Height:      Weight:   176 lb 2.4 oz (79.9 kg)   SpO2:   99% 100%    Intake/Output Summary (Last 24 hours) at 05/16/14 1218 Last data filed at 05/16/14 0948  Gross per 24 hour  Intake    309 ml  Output   2518 ml  Net  -2209 ml    PULM  CTAB CV  RRR GI  soft, NTND VASC  Bandages on L thigh AVG without active bleeding, good thrill  Laboratory CBC    Component Value Date/Time   WBC 6.5 05/16/2014 0630   HGB 9.5* 05/16/2014 0630   HCT 29.4* 05/16/2014 0630   PLT 125* 05/16/2014 0630    BMET    Component Value Date/Time   NA 136 05/16/2014 0630   K 5.4* 05/16/2014 0630   CL 99 05/16/2014 0630   CO2 25 05/16/2014 0630   GLUCOSE 89 05/16/2014 0630   BUN 63* 05/16/2014 0630   CREATININE 10.35* 05/16/2014 0630   CALCIUM 8.0* 05/16/2014 0630   GFRNONAA 4* 05/16/2014 0630   GFRAA 4* 05/16/2014 0630    Adele Barthel, MD [facility] [place]: [phone] Pager: [phone]  05/16/2014, 12:18 PM

## 2014-05-16 NOTE — Progress Notes (Signed)
Utilization review completed.  

## 2014-05-16 NOTE — Progress Notes (Signed)
Pt got up OOB to chair; L thigh AVF site bleeding; dressing removed; manual pressure held; new dressing applied; Dr. Bridgett Larsson paged at this time; will await callback.

## 2014-05-16 NOTE — Progress Notes (Signed)
Pt OOB to wash hands, L thigh AVF site bleeding for the second time today. Pressure dressing applied. Kim PA paged.

## 2014-05-16 NOTE — Progress Notes (Signed)
[facility] Progress Note  Assessment/Plan: 1. Bleeding AVG- VVS did shuntagram yesterday with PTA of common vein/venouse anastamosis 4/4 - Dr. Bridgett Larsson - needs repeat AF at outpt center after d/c 2. ESRD - TTS [name] via thigh AVG.. K 5.4 3. Hypertension/volume - controlled here- amlodipine, metop. EDW increased last week. No volume excess - attempted to get to edw but BP drop at the end of HD - net UF only 2.5 today so should only get down to about 80 - may need edw again at d/c - 4. Anemia - hgb 10.8- now 9.5 Micera due 4/5-sill give 25 of Aranesp today - No Fe.  5. Metabolic bone disease - and PTH 463 cont sensipar/phoslo/calitriol 6. Nutrition - Alb 2.6 renal diet. + suppl/vits 7. Liver transplant- on immunosuppression - per primary 8. DM- per primary 9. 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 INR 1.88, stable on HD.  [name] MD pager [phone]    cell [phone] 05/16/2014, 10:11 AM    Subjective:   Feels ok but weak after BP drop  Objective Filed Vitals:   05/16/14 0851 05/16/14 0907 05/16/14 0924 05/16/14 0930  BP: 131/56 90/53 100/51 91/50  Pulse: 64  67   Temp:      TempSrc:      Resp: 14  14   Height:      Weight:      SpO2:       Physical Exam General: supine on HD Heart: RRR + valve click Lungs: no rales Abdomen: soft NT Extremities: no edema Dialysis Access: left thigh AVGG  Dialysis Orders: TTS [name] 3hr 45 mins 78 kgs 2K/2.25Ca 400/1.5 No heparin Micera 75 q 2 weeks(last 3/22- next dose due 4/5) Calcitriol 0.5   Additional Objective Labs: Lab Results  Component Value Date   INR 1.88* 05/16/2014   INR 1.85* 05/15/2014   INR 1.68* 81/82/9937  Basic Metabolic Panel:  Recent Labs Lab 05/11/14 2226 05/13/14 0846 05/16/14 0630  NA 140 139 136  K  4.5 4.9 5.4*  CL 96 99 99  CO2 31 28 25   GLUCOSE 162* 115* 89  BUN 23 54* 63*  CREATININE 5.87* 9.25* 10.35*  CALCIUM 8.6 8.1* 8.0*  PHOS  --  7.3* 6.1*   Liver Function Tests:  Recent Labs Lab 05/11/14 2226 05/13/14 0846 05/16/14 0630  AST 37  --  26  ALT 22  --  17  ALKPHOS 91  --  78  BILITOT 0.6  --  0.6  PROT 7.2  --  6.7  ALBUMIN 2.9* 2.7* 2.6*  2.6*  CBC:  Recent Labs Lab 05/11/14 2226 05/13/14 0847 05/15/14 0352 05/16/14 0630  WBC 4.9 5.7 6.6 6.5  HGB 10.8* 9.9* 9.7* 9.5*  HCT 34.4* 30.8* 30.9* 29.4*  MCV 98.9 96.6 99.4 95.1  PLT 119* 112* 59* 125*   Blood Culture    Component Value Date/Time   SDES BLOOD RIGHT HAND 07/04/2013 0024   SPECREQUEST BOTTLES DRAWN AEROBIC ONLY 1CC 07/04/2013 0024   CULT  07/04/2013 0024    NO GROWTH 5 DAYS Performed at [facility] 07/10/2013 FINAL 07/04/2013 0024    CBG:  Recent Labs Lab 05/15/14 1111 05/15/14 1621 05/15/14 2115 05/16/14 0035 05/16/14 0421  GLUCAP 101* 110* 147* 120* 176*   IMedications: .  heparin 1,100 Units/hr (05/15/14 2155)   . sodium chloride   Intravenous Once  . sodium chloride   Intravenous Once  . amLODipine  2.5 mg Oral Daily  . aspirin EC  81 mg Oral Daily  . calcitRIOL  0.5 mcg Oral Once per day on Tue Thu Sat  . calcium acetate  1,334 mg Oral TID WC  . cinacalcet  30 mg Oral Q breakfast  . gabapentin  300 mg Oral QHS  . insulin aspart  0-15 Units Subcutaneous 6 times per day  . levothyroxine  150 mcg Oral QAC breakfast  . magnesium oxide  400 mg Oral Daily  . metoprolol tartrate  25 mg Oral BID  . nicotine  21 mg Transdermal Daily  . sodium chloride  3 mL Intravenous Q12H  . tacrolimus  3 mg Oral BID  . Warfarin - Pharmacist Dosing Inpatient   Does not apply [phone]

## 2014-05-16 NOTE — Progress Notes (Signed)
ANTICOAGULATION CONSULT NOTE - Follow Up Consult  Pharmacy Consult for Heparin/Coumadin Indication: mechanical AVR  Allergies  Allergen Reactions  . Acetaminophen Other (See Comments)    Liver transplant recipient   . Codeine Itching  . Mirtazapine Other (See Comments)    hallucination  . Morphine Itching    Patient Measurements: Height: 5\' 4"  (162.6 cm) Weight: 176 lb 2.4 oz (79.9 kg) IBW/kg (Calculated) : 54.7 Heparin Dosing Weight: 72 kg  Vital Signs: Temp: 98.7 F (37.1 C) (04/05 1402) Temp Source: Oral (04/05 1402) BP: 128/45 mmHg (04/05 1402) Pulse Rate: 70 (04/05 1402)  Labs:  Recent Labs  05/14/14 0605 05/14/14 1820 05/15/14 0352 05/16/14 0630  HGB  --   --  9.7* 9.5*  HCT  --   --  30.9* 29.4*  PLT  --   --  59* 125*  LABPROT 19.9*  --  21.5* 21.8*  INR 1.68*  --  1.85* 1.88*  HEPARINUNFRC  --  0.26* 0.47 0.32  CREATININE  --   --   --  10.35*    Estimated Creatinine Clearance: 6 mL/min (by C-G formula based on Cr of 10.35).  Assessment: 59 yo F on Coumadin PTA for mechanical AVR.  Coumadin being bridged with heparin for shuntogram on 4/4 at 1pm.  INR 1.88.  No bleeding from AVG noted today.  Heparin level is therapeutic on 1100 units/hr.  Goal of Therapy:  Heparin level 0.3-0.7 units/ml Monitor platelets by anticoagulation protocol: Yes   Plan:  Continue heparin at 1100 units/hr Coumadin 4mg  PO x 1 tonight Daily INR, heparin level, and CBC  Rayvin Abid, Pharm.D., BCPS Clinical Pharmacist Pager [phone] 05/16/2014 3:05 PM

## 2014-05-16 NOTE — Progress Notes (Signed)
RN received call back from [facility] regarding  L thigh AVF site bleeding. RN instructed to continue to monitor site for bleeding and to continue heparin gtt as ordered.  Callbell within reach, will continue to monitor closely.

## 2014-05-17 LAB — GLUCOSE, CAPILLARY
GLUCOSE-CAPILLARY: 154 mg/dL — AB (ref 70–99)
GLUCOSE-CAPILLARY: 83 mg/dL (ref 70–99)
Glucose-Capillary: 125 mg/dL — ABNORMAL HIGH (ref 70–99)
Glucose-Capillary: 72 mg/dL (ref 70–99)
Glucose-Capillary: 186 mg/dL — ABNORMAL HIGH (ref 70–99)
Glucose-Capillary: 200 mg/dL — ABNORMAL HIGH (ref 70–99)
Glucose-Capillary: 66 mg/dL — ABNORMAL LOW (ref 70–99)
Glucose-Capillary: 177 mg/dL — ABNORMAL HIGH (ref 70–99)

## 2014-05-17 LAB — CBC
HCT: 29 % — ABNORMAL LOW (ref 36.0–46.0)
Hemoglobin: 9.3 g/dL — ABNORMAL LOW (ref 12.0–15.0)
MCH: 30.9 pg (ref 26.0–34.0)
MCHC: 32.1 g/dL (ref 30.0–36.0)
MCV: 96.3 fL (ref 78.0–100.0)
PLATELETS: 94 10*3/uL — AB (ref 150–400)
RBC: 3.01 MIL/uL — AB (ref 3.87–5.11)
RDW: 15.9 % — ABNORMAL HIGH (ref 11.5–15.5)
WBC: 6.1 10*3/uL (ref 4.0–10.5)

## 2014-05-17 LAB — PROTIME-INR
INR: 2.07 — AB (ref 0.00–1.49)
Prothrombin Time: 23.5 seconds — ABNORMAL HIGH (ref 11.6–15.2)

## 2014-05-17 LAB — HEPARIN LEVEL (UNFRACTIONATED): Heparin Unfractionated: 0.4 [IU]/mL (ref 0.30–0.70)

## 2014-05-17 MED ORDER — WARFARIN SODIUM 4 MG PO TABS
4.0000 mg | ORAL_TABLET | Freq: Once | ORAL | Status: AC
  Administered 2014-05-17: 4 mg via ORAL
  Filled 2014-05-17 (×2): qty 1

## 2014-05-17 NOTE — Progress Notes (Signed)
After checking patient the dressing on her graft site is still clean dry and intact with no further bleeding coming through the dressing. Will continue to monitor.  Sandre Kitty

## 2014-05-17 NOTE — Progress Notes (Signed)
Called about pt bleeding from thigh graft.  Pt sleeping comfortably in chair.  There is a small trickle from lateral stick site.  There is a mild pressure dressing in place.  Heparin was discontinued this am.  She is on coumadin for mechanical aortic valve.    If further bleeding, gentle pressure with one finger over stick site.  Do not use sandbag for pressure.   Shakara Tweedy 05/17/2014 1:50 PM

## 2014-05-17 NOTE — Progress Notes (Signed)
Patient felt shaky and dizzy around 0300 this morning, BS checked and 72, crackers and orange juice given to patient, CBG rechecked and 125, 0400 insulin dose not given, will continue to monitor.

## 2014-05-17 NOTE — Progress Notes (Signed)
After holding pressure for 10 minutes and applying new dressing it appears that bleeding has slowed. Will continue to monitor. [name]

## 2014-05-17 NOTE — Progress Notes (Signed)
Dr. Bridgett Larsson paged and made aware that graft is still bleeding. Dr. Jonnie Finner has discontinued the heparin drip and wanted to make sure that Dr. Bridgett Larsson knows. He said to hold pressure and redress. Will continue to monitor.    Sandre Kitty

## 2014-05-17 NOTE — Progress Notes (Signed)
Left thigh AV graft with moderate amount of bloody drainage noted to dressing around 2300, pressure applied and surgi foam dressing obtained from dialysis, surgifoam, gauze, tape, and kerlix pressure dressing applied with no bleeding noted at this time, will continue to monitor closely and intervene as needed.

## 2014-05-17 NOTE — Progress Notes (Addendum)
   [facility] [place]  Subjective  - Her left thigh graft started bleeding after dialysis again from both stick sites.  4:10 pm pressure and dry dressing.  Repeat bleeding when OOB to wash hands at 6pm.  Episode 3 at 7:30 pm order to continue heparin for mechanical aortic valve.  Pressure and dressing reapplied at 2:30 am.     Objective 136/48 62 97.6 F (36.4 C) (Oral) 20 100%  Intake/Output Summary (Last 24 hours) at 05/17/14 0737 Last data filed at 05/16/14 1243  Gross per 24 hour  Intake    260 ml  Output   2518 ml  Net  -2258 ml    Left thigh graft stick sites with small amount of active bleeding at both sites. Clean dry dressing applied with surgifoam.    Assessment/Planning: POD # 2 PROCEDURE: 1. left thigh arteriovenous graft cannulation under ultrasound guidance 2. left thigh shuntogram 3. Venoplasty of common vein (6 mm x 60 mm)  Pending INR for therapeutic anticoagulation for heart valve.   The patient is still having active bleeding post dialysis.  It has slowed down significantly, but she is uncomfortable going home with this continued problem.  I will discuss options with Dr. Bridgett Larsson.  Laurence Slate [facility] 05/17/2014 7:37 AM --  Laboratory Lab Results:  Recent Labs  05/15/14 0352 05/16/14 0630  WBC 6.6 6.5  HGB 9.7* 9.5*  HCT 30.9* 29.4*  PLT 59* 125*   BMET  Recent Labs  05/16/14 0630  NA 136  K 5.4*  CL 99  CO2 25  GLUCOSE 89  BUN 63*  CREATININE 10.35*  CALCIUM 8.0*    COAG Lab Results  Component Value Date   INR 2.07* 05/17/2014   INR 1.88* 05/16/2014   INR 1.85* 05/15/2014   No results found for: PTT   Addendum  I have independently interviewed and examined the patient, and I agree with the physician assistant's findings.  No actively bleeding currently.  Suspect majority of bleeding from her L thigh AVG is her anticoagulation as the venous anastomosis was dilated to 5 mm, so this limits venous  hypertension as the etiology.    - Home in 1-2 days once INR > 2.5  Adele Barthel, MD [facility] [place] Office: [phone] Pager: [phone]  05/17/2014, 8:51 AM

## 2014-05-17 NOTE — Consult Note (Signed)
   [facility] Inpatient Consult   05/17/2014  [name] [birth year] [phone]   Patient evaluated for [place] Management services. Came to bedside to speak with her. However, she was sleeping soundly and did not wake to name being called. Made inpatient RNCM aware of attempt to engage patient for [place] Management services.  [name], MSN-Ed, RN,BSN [facility] Liaison [phone]

## 2014-05-17 NOTE — Progress Notes (Signed)
Dressing to Lt thigh changed. Continuously oozing from site. Old dressing saturated. New gauze dressing applied will monitor patient. Raziya Aveni, Bettina Gavia RN

## 2014-05-17 NOTE — Progress Notes (Signed)
[name] Progress Note   Assessment: 1. Bleeding AVG- s/p shuntogram w PTA of venous stenosis 4/4 - Dr. Bridgett Larsson - needs repeat AF at outpt center after d/c. Bleeding now again after HD yest, INR 2.0, have d/w cardiac surgery, hold hep until bleeding stops up to several hrs ok  2. ESRD - TTS  3. HTN/vol - up 2kg , cont meds 4. Anemia - hgb 10.8- now 9.5 Micera due 4/5-sill give 25 of Aranesp today - No Fe.  5. Metabolic bone disease - and PTH 463 cont sensipar/phoslo/calitriol 6. Nutrition - Alb 2.6 renal diet. + suppl/vits 7. Liver transplant- on immunosuppression - per primary 8. DM- per primary 9. Hx of AVR -on coumadin - managed by cards- on heparin/coumadin  Plan - hold hep until bleeding stops, HD first shift in am.   [name] MD (pgr) [phone]    ([phone] 05/17/2014, 10:24 AM    Subjective:   Bleeding several times from L thigh AVG started after HD yest and has rebleed several times. Still on iv hep and INR today up to 2.0.  No symptoms.   Objective Filed Vitals:   05/16/14 1402 05/16/14 2025 05/17/14 0255 05/17/14 0648  BP: 128/45 147/45 169/53 136/48  Pulse: 70 66 70 62  Temp: 98.7 F (37.1 C) 97.9 F (36.6 C) 97.7 F (36.5 C) 97.6 F (36.4 C)  TempSrc: Oral Oral Oral Oral  Resp: 16 17 24 20   Height:      Weight:    79.7 kg (175 lb 11.3 oz)  SpO2: 100% 100% 100% 100%   Physical Exam General: supine on HD Heart: RRR + valve click Lungs: no rales Abdomen: soft NT Extremities: no edema Dialysis Access: left thigh AVGG  Dialysis Orders: TTS [name] 3hr 45 mins 78 kgs 2K/2.25Ca 400/1.5 No heparin Micera 75 q 2 weeks(last 3/22- next dose due 4/5) Calcitriol 0.5   Additional Objective Labs: Lab Results  Component Value Date   INR 2.07* 05/17/2014   INR 1.88* 05/16/2014   INR 1.85* 02/72/5366  Basic Metabolic Panel:  Recent Labs Lab 05/11/14 2226 05/13/14 0846 05/16/14 0630  NA 140 139 136  K 4.5 4.9 5.4*  CL  96 99 99  CO2 31 28 25   GLUCOSE 162* 115* 89  BUN 23 54* 63*  CREATININE 5.87* 9.25* 10.35*  CALCIUM 8.6 8.1* 8.0*  PHOS  --  7.3* 6.1*   Liver Function Tests:  Recent Labs Lab 05/11/14 2226 05/13/14 0846 05/16/14 0630  AST 37  --  26  ALT 22  --  17  ALKPHOS 91  --  78  BILITOT 0.6  --  0.6  PROT 7.2  --  6.7  ALBUMIN 2.9* 2.7* 2.6*  2.6*  CBC:  Recent Labs Lab 05/11/14 2226 05/13/14 0847 05/15/14 0352 05/16/14 0630 05/17/14 0503  WBC 4.9 5.7 6.6 6.5 6.1  HGB 10.8* 9.9* 9.7* 9.5* 9.3*  HCT 34.4* 30.8* 30.9* 29.4* 29.0*  MCV 98.9 96.6 99.4 95.1 96.3  PLT 119* 112* 59* 125* PENDING   Blood Culture    Component Value Date/Time   SDES BLOOD RIGHT HAND 07/04/2013 0024   SPECREQUEST BOTTLES DRAWN AEROBIC ONLY 1CC 07/04/2013 0024   CULT  07/04/2013 0024    NO GROWTH 5 DAYS Performed at [facility] 07/10/2013 FINAL 07/04/2013 0024    CBG:  Recent Labs Lab 05/16/14 2027 05/17/14 0021 05/17/14 0253 05/17/14 0323 05/17/14 0646  GLUCAP 125* 154* 72 125* 186*  IMedications:   . sodium chloride   Intravenous Once  . sodium chloride   Intravenous Once  . amLODipine  2.5 mg Oral Daily  . aspirin EC  81 mg Oral Daily  . calcitRIOL  0.5 mcg Oral Once per day on Tue Thu Sat  . calcium acetate  1,334 mg Oral TID WC  . cinacalcet  30 mg Oral Q breakfast  . darbepoetin (ARANESP) injection - DIALYSIS  25 mcg Intravenous Q Tue-HD  . feeding supplement (NEPRO CARB STEADY)  237 mL Oral BID BM  . gabapentin  300 mg Oral QHS  . insulin aspart  0-15 Units Subcutaneous 6 times per day  . levothyroxine  150 mcg Oral QAC breakfast  . magnesium oxide  400 mg Oral Daily  . metoprolol tartrate  25 mg Oral BID  . multivitamin  1 tablet Oral QHS  . nicotine  21 mg Transdermal Daily  . sodium chloride  3 mL Intravenous Q12H  . tacrolimus  3 mg Oral BID  . Warfarin - Pharmacist Dosing Inpatient   Does not apply [phone]

## 2014-05-17 NOTE — Progress Notes (Signed)
ANTICOAGULATION CONSULT NOTE - Follow Up Consult  Pharmacy Consult for Coumadin Indication: mechanical AVR  Allergies  Allergen Reactions  . Acetaminophen Other (See Comments)    Liver transplant recipient   . Codeine Itching  . Mirtazapine Other (See Comments)    hallucination  . Morphine Itching    Patient Measurements: Height: 5\' 4"  (162.6 cm) Weight: 175 lb 11.3 oz (79.7 kg) IBW/kg (Calculated) : 54.7 Heparin Dosing Weight: 72 kg  Vital Signs: Temp: 97.6 F (36.4 C) (04/06 0648) Temp Source: Oral (04/06 0648) BP: 136/48 mmHg (04/06 0648) Pulse Rate: 62 (04/06 0648)  Labs:  Recent Labs  05/15/14 0352 05/16/14 0630 05/17/14 0503  HGB 9.7* 9.5* 9.3*  HCT 30.9* 29.4* 29.0*  PLT 59* 125* PENDING  LABPROT 21.5* 21.8* 23.5*  INR 1.85* 1.88* 2.07*  HEPARINUNFRC 0.47 0.32 0.40  CREATININE  --  10.35*  --     Estimated Creatinine Clearance: 6 mL/min (by C-G formula based on Cr of 10.35).  Assessment: 59 yo F on Coumadin PTA for mechanical AVR. Coumadin was being bridged with IV heparin however this was discontinued this morning due to multiple episodes of bleeding from AVG.  INR is subtherapeutic at 2.07. Hb is low stable, platelets pending from this morning.  Goal of Therapy:  INR 2.5-3 per Coumadin clinic notes Monitor platelets by anticoagulation protocol: Yes   Plan:  Coumadin 4 mg PO tonight INR daily Please re-consult pharmacy if IV heparin to be resumed  [facility], Derma.D., BCPS Clinical Pharmacist Pager: [phone] 05/17/2014 10:37 AM

## 2014-05-17 NOTE — Progress Notes (Addendum)
Dr. Michail Sermon was in patient's rm this morning and assessed the lt graft. Bleeding episodes 4 times last night per night nurse and twice this morning. Suregel applied from HD department. Was instructed to stop the heparin infusion.

## 2014-05-18 LAB — RENAL FUNCTION PANEL
ALBUMIN: 2.7 g/dL — AB (ref 3.5–5.2)
Anion gap: 14 (ref 5–15)
BUN: 50 mg/dL — AB (ref 6–23)
CO2: 23 mmol/L (ref 19–32)
CREATININE: 9.22 mg/dL — AB (ref 0.50–1.10)
Calcium: 8.1 mg/dL — ABNORMAL LOW (ref 8.4–10.5)
Chloride: 93 mmol/L — ABNORMAL LOW (ref 96–112)
GFR calc Af Amer: 5 mL/min — ABNORMAL LOW (ref >90)
GFR calc non Af Amer: 4 mL/min — ABNORMAL LOW (ref >90)
GLUCOSE: 122 mg/dL — AB (ref 70–99)
PHOSPHORUS: 5.3 mg/dL — AB (ref 2.3–4.6)
Potassium: 5.2 mmol/L — ABNORMAL HIGH (ref 3.5–5.1)
Sodium: 130 mmol/L — ABNORMAL LOW (ref 135–145)

## 2014-05-18 LAB — CBC
HCT: 29.5 % — ABNORMAL LOW (ref 36.0–46.0)
HEMOGLOBIN: 9.4 g/dL — AB (ref 12.0–15.0)
MCH: 30.9 pg (ref 26.0–34.0)
MCHC: 31.9 g/dL (ref 30.0–36.0)
MCV: 97 fL (ref 78.0–100.0)
Platelets: 101 10*3/uL — ABNORMAL LOW (ref 150–400)
RBC: 3.04 MIL/uL — AB (ref 3.87–5.11)
RDW: 15.7 % — ABNORMAL HIGH (ref 11.5–15.5)
WBC: 5.8 10*3/uL (ref 4.0–10.5)

## 2014-05-18 LAB — GLUCOSE, CAPILLARY
GLUCOSE-CAPILLARY: 104 mg/dL — AB (ref 70–99)
GLUCOSE-CAPILLARY: 151 mg/dL — AB (ref 70–99)
GLUCOSE-CAPILLARY: 136 mg/dL — AB (ref 70–99)
Glucose-Capillary: 167 mg/dL — ABNORMAL HIGH (ref 70–99)
Glucose-Capillary: 196 mg/dL — ABNORMAL HIGH (ref 70–99)

## 2014-05-18 LAB — PROTIME-INR
INR: 2.27 — AB (ref 0.00–1.49)
Prothrombin Time: 25.2 seconds — ABNORMAL HIGH (ref 11.6–15.2)

## 2014-05-18 MED ORDER — NEPRO/CARBSTEADY PO LIQD: 237.0000 mL | ORAL | Status: DC | PRN

## 2014-05-18 MED ORDER — HEPARIN SODIUM (PORCINE) 1000 UNIT/ML DIALYSIS: 1000.0000 [IU] | INTRAMUSCULAR | Status: DC | PRN

## 2014-05-18 MED ORDER — SODIUM CHLORIDE 0.9 % IV SOLN: 100.0000 mL | INTRAVENOUS | Status: DC | PRN

## 2014-05-18 MED ORDER — WARFARIN SODIUM 4 MG PO TABS
4.0000 mg | ORAL_TABLET | Freq: Once | ORAL | Status: AC
  Administered 2014-05-18: 4 mg via ORAL
  Filled 2014-05-18 (×2): qty 1

## 2014-05-18 MED ORDER — ALTEPLASE 2 MG IJ SOLR: 2.0000 mg | Freq: Once | INTRAMUSCULAR | Status: DC | PRN

## 2014-05-18 MED ORDER — LIDOCAINE HCL (PF) 1 % IJ SOLN: 5.0000 mL | INTRAMUSCULAR | Status: DC | PRN

## 2014-05-18 MED ORDER — LIDOCAINE-PRILOCAINE 2.5-2.5 % EX CREA: 1.0000 "application " | TOPICAL_CREAM | CUTANEOUS | Status: DC | PRN

## 2014-05-18 MED ORDER — PENTAFLUOROPROP-TETRAFLUOROETH EX AERO: 1.0000 "application " | INHALATION_SPRAY | CUTANEOUS | Status: DC | PRN

## 2014-05-18 NOTE — Progress Notes (Signed)
[facility] Progress Note  Assessment/Plan: 1. Bleeding AVG- resolved off of IV heparin.  INR 2.2 today. S/P shuntogram w PTA of venous stenosis 4/4 - Dr. Bridgett Larsson - needs repeat AF at outpt center after d/c  2. HTN/vol - stable, cont meds 3. Anemia - 9.4 stable  Aranesp 25 given 4/5 4. Metabolic bone disease - and PTH 463 cont sensipar/phoslo/calitriol P 5.3 5. Nutrition - Alb 2.7 renal diet. + suppl/vits 6. Liver transplant- on immunosuppression - per primary 7. DM- per primary 8. 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 ESRD TTS 10. Thrombocytopenia - platelets variable - no heparin HD  Myriam Jacobson, PA-C [facility] Beeper [phone] 05/18/2014,8:52 AM  LOS: 5 days   Pt seen, examined and agree w A/P as above.  [name] MD pager [phone]    cell [phone] 05/18/2014, 12:14 PM    Subjective:   My INR is 2.2 and they want it to be 2.5  Objective Filed Vitals:   05/18/14 0733 05/18/14 0748 05/18/14 0800 05/18/14 0830  BP: 148/64 142/68 161/71 176/79  Pulse: 70 63 59 58  Temp: 97.9 F (36.6 C)     TempSrc: Oral     Resp: 21     Height:      Weight: 82.1 kg (181 lb)     SpO2: 98% 99%     Physical Exam goal 4 L General: NAD on HD Heart: RRR + valve click Lungs:  No rales Abdomen: soft NT Extremities: no edema Dialysis Access: left thigh AVGG  Dialysis Orders: TTS [name] 3hr 45 mins 78 kgs 2K/2.25Ca 400/1.5 No heparin Micera 75 q 2 weeks(last 3/22- next dose due 4/5) Calcitriol 0.5   Additional Objective Labs: Basic Metabolic Panel:  Recent Labs Lab 05/13/14 0846 05/16/14 0630 05/18/14 0801  NA 139 136 130*  K 4.9 5.4* 5.2*  CL 99 99 93*  CO2 28 25 23   GLUCOSE 115* 89 122*  BUN 54* 63* 50*  CREATININE 9.25* 10.35* 9.22*  CALCIUM 8.1* 8.0* 8.1*  PHOS 7.3* 6.1* 5.3*   Liver Function Tests:  Recent Labs Lab 05/11/14 2226 05/13/14 0846 05/16/14 0630 05/18/14 0801  AST 37   --  26  --   ALT 22  --  17  --   ALKPHOS 91  --  78  --   BILITOT 0.6  --  0.6  --   PROT 7.2  --  6.7  --   ALBUMIN 2.9* 2.7* 2.6*  2.6* 2.7*   CBC:  Recent Labs Lab 05/13/14 0847 05/15/14 0352 05/16/14 0630 05/17/14 0503 05/18/14 0502  WBC 5.7 6.6 6.5 6.1 5.8  HGB 9.9* 9.7* 9.5* 9.3* 9.4*  HCT 30.8* 30.9* 29.4* 29.0* 29.5*  MCV 96.6 99.4 95.1 96.3 97.0  PLT 112* 59* 125* 94* 101*   Blood Culture    Component Value Date/Time   SDES BLOOD RIGHT HAND 07/04/2013 0024   SPECREQUEST BOTTLES DRAWN AEROBIC ONLY 1CC 07/04/2013 0024   CULT  07/04/2013 0024    NO GROWTH 5 DAYS Performed at [facility]   REPTSTATUS 07/10/2013 FINAL 07/04/2013 0024   Lab Results  Component Value Date   INR 2.27* 05/18/2014   INR 2.07* 05/17/2014   INR 1.88* 05/16/2014    CBG:  Recent Labs Lab 05/17/14 1620 05/17/14 1707 05/17/14 2052 05/18/14 0042 05/18/14 0449  GLUCAP 66* 83 177* 196* 104*  Medications:   . sodium chloride   Intravenous Once  .  sodium chloride   Intravenous Once  . amLODipine  2.5 mg Oral Daily  . aspirin EC  81 mg Oral Daily  . calcitRIOL  0.5 mcg Oral Once per day on Tue Thu Sat  . calcium acetate  1,334 mg Oral TID WC  . cinacalcet  30 mg Oral Q breakfast  . darbepoetin (ARANESP) injection - DIALYSIS  25 mcg Intravenous Q Tue-HD  . feeding supplement (NEPRO CARB STEADY)  237 mL Oral BID BM  . gabapentin  300 mg Oral QHS  . insulin aspart  0-15 Units Subcutaneous 6 times per day  . levothyroxine  150 mcg Oral QAC breakfast  . magnesium oxide  400 mg Oral Daily  . metoprolol tartrate  25 mg Oral BID  . multivitamin  1 tablet Oral QHS  . nicotine  21 mg Transdermal Daily  . sodium chloride  3 mL Intravenous Q12H  . tacrolimus  3 mg Oral BID  . Warfarin - Pharmacist Dosing Inpatient   Does not apply [phone]

## 2014-05-18 NOTE — Progress Notes (Signed)
Inpatient Diabetes Program Recommendations  AACE/ADA: New Consensus Statement on Inpatient Glycemic Control (2013)  Target Ranges:  Prepandial:   less than 140 mg/dL      Peak postprandial:   less than 180 mg/dL (1-2 hours)      Critically ill patients:  140 - 180 mg/dL   Results for [name], [name] (MRN [id number]) as of 05/18/2014 11:28  Ref. Range 05/17/2014 03:23 05/17/2014 06:46 05/17/2014 11:24 05/17/2014 16:20 05/17/2014 17:07 05/17/2014 20:52 05/18/2014 00:42 05/18/2014 04:49  Glucose-Capillary Latest Range: 70-99 mg/dL 125 (H) 186 (H) 200 (H) 66 (L) 83 177 (H) 196 (H) 104 (H)     Reason for assessment: 2 episodes of low blood sugars- 1600 and 0300  Current orders for Inpatient glycemic control: Novolog moderate correction 0-15 units q4h.  Please consider decreasing her Novolog correction to sensitive correction 0-9 units tid and Novolog 0-5 units qhs since she's eating.     Gentry Fitz, RN, BA, MHA, CDE Diabetes Coordinator Inpatient Diabetes Program  [phone] (Team Pager) [phone] [facility] Office) 05/18/2014 11:34 AM

## 2014-05-18 NOTE — Progress Notes (Signed)
   Daily Progress Note  No further bleeding.  Home tomorrow.  Coumadin nearly therapeutic.  Lab Results  Component Value Date   INR 2.27* 05/18/2014   INR 2.07* 05/17/2014   INR 1.88* 05/16/2014    Adele Barthel, MD [facility] [place] Office: [phone] Pager: [phone]  05/18/2014, 8:53 AM

## 2014-05-18 NOTE — Progress Notes (Signed)
ANTICOAGULATION CONSULT NOTE - Follow Up Consult  Pharmacy Consult for Coumadin Indication: mechanical AVR  Allergies  Allergen Reactions  . Acetaminophen Other (See Comments)    Liver transplant recipient   . Codeine Itching  . Mirtazapine Other (See Comments)    hallucination  . Morphine Itching    Patient Measurements: Height: 5\' 4"  (162.6 cm) Weight: 172 lb 2.9 oz (78.1 kg) IBW/kg (Calculated) : 54.7 Heparin Dosing Weight: 72 kg  Vital Signs: Temp: 97.9 F (36.6 C) (04/07 0733) Temp Source: Oral (04/07 0733) BP: 135/63 mmHg (04/07 1128) Pulse Rate: 66 (04/07 1128)  Labs:  Recent Labs  05/16/14 0630 05/17/14 0503 05/18/14 0502 05/18/14 0801  HGB 9.5* 9.3* 9.4*  --   HCT 29.4* 29.0* 29.5*  --   PLT 125* 94* 101*  --   LABPROT 21.8* 23.5* 25.2*  --   INR 1.88* 2.07* 2.27*  --   HEPARINUNFRC 0.32 0.40  --   --   CREATININE 10.35*  --   --  9.22*    Estimated Creatinine Clearance: 6.6 mL/min (by C-G formula based on Cr of 9.22).  Assessment: 59 yo F on Coumadin PTA for mechanical AVR. Coumadin was being bridged with IV heparin however this was discontinued 4/6 due to multiple episodes of bleeding from AVG.  INR is subtherapeutic at 2.27 but trending up on home dose. Hb is low stable, platelets improved.  Goal of Therapy:  INR 2.5-3 per Coumadin clinic notes Monitor platelets by anticoagulation protocol: Yes   Plan:  Coumadin 4 mg PO tonight INR daily  [employer], Pharm.D., BCPS Clinical Pharmacist Pager [phone] 05/18/2014 1:46 PM

## 2014-05-18 NOTE — Progress Notes (Signed)
Medicare Important Message given? YES  (If response is "NO", the following Medicare IM given date fields will be blank)  Date Medicare IM given: 05/18/14 Medicare IM given by:  Mersadez Linden  

## 2014-05-19 LAB — GLUCOSE, CAPILLARY
GLUCOSE-CAPILLARY: 108 mg/dL — AB (ref 70–99)
Glucose-Capillary: 130 mg/dL — ABNORMAL HIGH (ref 70–99)
Glucose-Capillary: 134 mg/dL — ABNORMAL HIGH (ref 70–99)
Glucose-Capillary: 137 mg/dL — ABNORMAL HIGH (ref 70–99)
Glucose-Capillary: 142 mg/dL — ABNORMAL HIGH (ref 70–99)
Glucose-Capillary: 156 mg/dL — ABNORMAL HIGH (ref 70–99)

## 2014-05-19 LAB — CBC
HEMATOCRIT: 30 % — AB (ref 36.0–46.0)
HEMOGLOBIN: 9.6 g/dL — AB (ref 12.0–15.0)
MCH: 31.2 pg (ref 26.0–34.0)
MCHC: 32 g/dL (ref 30.0–36.0)
MCV: 97.4 fL (ref 78.0–100.0)
PLATELETS: 125 10*3/uL — AB (ref 150–400)
RBC: 3.08 MIL/uL — AB (ref 3.87–5.11)
RDW: 15.7 % — ABNORMAL HIGH (ref 11.5–15.5)
WBC: 5.1 10*3/uL (ref 4.0–10.5)

## 2014-05-19 LAB — PROTIME-INR
INR: 2.38 — AB (ref 0.00–1.49)
PROTHROMBIN TIME: 26.2 s — AB (ref 11.6–15.2)

## 2014-05-19 MED ORDER — WARFARIN SODIUM 5 MG PO TABS
5.0000 mg | ORAL_TABLET | Freq: Once | ORAL | Status: AC
  Administered 2014-05-19: 5 mg via ORAL
  Filled 2014-05-19: qty 1

## 2014-05-19 NOTE — Progress Notes (Signed)
[facility] Progress Note  Assessment/Plan: 1. Bleeding thigh AVG- resolved.  S/P shuntogram w PTA of venous stenosis 4/4 - Dr. Bridgett Larsson - needs repeat AF at outpt center after d/c  2. HTN/vol - stable, cont meds 3. Anemia - 9.4 stable  Aranesp 25 given 4/5 4. Metabolic bone disease - and PTH 463 cont sensipar/phoslo/calitriol P 5.3 5. Nutrition - Alb 2.7 renal diet. + suppl/vits 6. Liver transplant- on immunosuppression - per primary 7. DM- per primary 8. 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 ESRD TTS 10. Thrombocytopenia - platelets variable - no heparin HD  [name] MD pager [phone]    cell [phone] 05/19/2014, 9:17 AM    Subjective:   My INR is 2.2 and they want it to be 2.5  Objective Filed Vitals:   05/18/14 1128 05/18/14 1413 05/18/14 1957 05/19/14 0434  BP: 135/63 140/50 107/45 140/46  Pulse: 66 67 69 64  Temp:  98.6 F (37 C) 98.1 F (36.7 C) 97.8 F (36.6 C)  TempSrc:  Oral Oral Oral  Resp: 18 18 19 18   Height:      Weight: 78.1 kg (172 lb 2.9 oz)   80 kg (176 lb 5.9 oz)  SpO2:  99% 100% 100%   Physical Exam goal 4 L General: NAD on HD Heart: RRR + valve click Lungs:  No rales Abdomen: soft NT Extremities: no edema Dialysis Access: left thigh AVGG  Dialysis Orders: TTS [name] 3hr 45 mins 78 kgs 2K/2.25Ca 400/1.5 No heparin Micera 75 q 2 weeks(last 3/22- next dose due 4/5) Calcitriol 0.5   Additional Objective Labs: Basic Metabolic Panel:  Recent Labs Lab 05/13/14 0846 05/16/14 0630 05/18/14 0801  NA 139 136 130*  K 4.9 5.4* 5.2*  CL 99 99 93*  CO2 28 25 23   GLUCOSE 115* 89 122*  BUN 54* 63* 50*  CREATININE 9.25* 10.35* 9.22*  CALCIUM 8.1* 8.0* 8.1*  PHOS 7.3* 6.1* 5.3*   Liver Function Tests:  Recent Labs Lab 05/13/14 0846 05/16/14 0630 05/18/14 0801  AST  --  26  --   ALT  --  17  --   ALKPHOS  --  78  --   BILITOT  --  0.6  --   PROT  --  6.7  --   ALBUMIN 2.7* 2.6*  2.6* 2.7*    CBC:  Recent Labs Lab 05/15/14 0352 05/16/14 0630 05/17/14 0503 05/18/14 0502 05/19/14 0403  WBC 6.6 6.5 6.1 5.8 5.1  HGB 9.7* 9.5* 9.3* 9.4* 9.6*  HCT 30.9* 29.4* 29.0* 29.5* 30.0*  MCV 99.4 95.1 96.3 97.0 97.4  PLT 59* 125* 94* 101* 125*   Blood Culture    Component Value Date/Time   SDES BLOOD RIGHT HAND 07/04/2013 0024   SPECREQUEST BOTTLES DRAWN AEROBIC ONLY 1CC 07/04/2013 0024   CULT  07/04/2013 0024    NO GROWTH 5 DAYS Performed at [facility]   REPTSTATUS 07/10/2013 FINAL 07/04/2013 0024   Lab Results  Component Value Date   INR 2.38* 05/19/2014   INR 2.27* 05/18/2014   INR 2.07* 05/17/2014    CBG:  Recent Labs Lab 05/18/14 1615 05/18/14 2136 05/19/14 0002 05/19/14 0431 05/19/14 0807  GLUCAP 151* 136* 134* 130* 108*  Medications:   . sodium chloride   Intravenous Once  . sodium chloride   Intravenous Once  . amLODipine  2.5 mg Oral Daily  . aspirin EC  81 mg Oral Daily  . calcitRIOL  0.5 mcg  Oral Once per day on Tue Thu Sat  . calcium acetate  1,334 mg Oral TID WC  . cinacalcet  30 mg Oral Q breakfast  . darbepoetin (ARANESP) injection - DIALYSIS  25 mcg Intravenous Q Tue-HD  . feeding supplement (NEPRO CARB STEADY)  237 mL Oral BID BM  . gabapentin  300 mg Oral QHS  . insulin aspart  0-15 Units Subcutaneous 6 times per day  . levothyroxine  150 mcg Oral QAC breakfast  . magnesium oxide  400 mg Oral Daily  . metoprolol tartrate  25 mg Oral BID  . multivitamin  1 tablet Oral QHS  . nicotine  21 mg Transdermal Daily  . sodium chloride  3 mL Intravenous Q12H  . tacrolimus  3 mg Oral BID  . Warfarin - Pharmacist Dosing Inpatient   Does not apply [phone]

## 2014-05-19 NOTE — Progress Notes (Signed)
ANTICOAGULATION CONSULT NOTE - Follow Up Consult  Pharmacy Consult for Coumadin Indication: mechanical AVR  Allergies  Allergen Reactions  . Acetaminophen Other (See Comments)    Liver transplant recipient   . Codeine Itching  . Mirtazapine Other (See Comments)    hallucination  . Morphine Itching    Patient Measurements: Height: 5\' 4"  (162.6 cm) Weight: 176 lb 5.9 oz (80 kg) IBW/kg (Calculated) : 54.7 Heparin Dosing Weight: 72 kg  Vital Signs: Temp: 97.8 F (36.6 C) (04/08 0434) Temp Source: Oral (04/08 0434) BP: 140/46 mmHg (04/08 0434) Pulse Rate: 64 (04/08 0434)  Labs:  Recent Labs  05/17/14 0503 05/18/14 0502 05/18/14 0801 05/19/14 0403  HGB 9.3* 9.4*  --  9.6*  HCT 29.0* 29.5*  --  30.0*  PLT 94* 101*  --  125*  LABPROT 23.5* 25.2*  --  26.2*  INR 2.07* 2.27*  --  2.38*  HEPARINUNFRC 0.40  --   --   --   CREATININE  --   --  9.22*  --     Estimated Creatinine Clearance: 6.7 mL/min (by C-G formula based on Cr of 9.22).  Assessment: 59 yo F on Coumadin PTA for mechanical AVR. Coumadin was being bridged with IV heparin however this was discontinued 4/6 due to multiple episodes of bleeding from AVG.  INR is subtherapeutic at 2.38 but trending up on home dose. Will give a small boost dose of 5 mg tonight.  Hb is low stable, platelets improved.  Goal of Therapy:  INR 2.5-3 per Coumadin clinic notes Monitor platelets by anticoagulation protocol: Yes   Plan:  Coumadin 5 mg PO tonight INR daily  [employer], Pharm.D., BCPS Clinical Pharmacist Pager [phone] 05/19/2014 2:52 PM

## 2014-05-19 NOTE — Progress Notes (Addendum)
  [facility] Progress Note  05/19/2014 4:10 PM 4 Days Post-Op  Subjective:  No complaints.   Filed Vitals:   05/19/14 0434  BP: 140/46  Pulse: 64  Temp: 97.8 F (36.6 C)  Resp: 18    Physical Exam: Left thigh AVG with audible bruit and palpable thrill. No further bleeding.   CBC    Component Value Date/Time   WBC 5.1 05/19/2014 0403   RBC 3.08* 05/19/2014 0403   HGB 9.6* 05/19/2014 0403   HCT 30.0* 05/19/2014 0403   PLT 125* 05/19/2014 0403   MCV 97.4 05/19/2014 0403   MCH 31.2 05/19/2014 0403   MCHC 32.0 05/19/2014 0403   RDW 15.7* 05/19/2014 0403   LYMPHSABS 1.3 04/04/2014 0503   MONOABS 0.6 04/04/2014 0503   EOSABS 0.1 04/04/2014 0503   BASOSABS 0.0 04/04/2014 0503    BMET    Component Value Date/Time   NA 130* 05/18/2014 0801   K 5.2* 05/18/2014 0801   CL 93* 05/18/2014 0801   CO2 23 05/18/2014 0801   GLUCOSE 122* 05/18/2014 0801   BUN 50* 05/18/2014 0801   CREATININE 9.22* 05/18/2014 0801   CALCIUM 8.1* 05/18/2014 0801   GFRNONAA 4* 05/18/2014 0801   GFRAA 5* 05/18/2014 0801    INR    Component Value Date/Time   INR 2.38* 05/19/2014 0403   INR 3 03/03/2014     Intake/Output Summary (Last 24 hours) at 05/19/14 1610 Last data filed at 05/19/14 1330  Gross per 24 hour  Intake    480 ml  Output      0 ml  Net    480 ml     Assessment:  59 y.o. female is s/p: left thigh shuntogram and venoplasty of common vein.   4 Days Post-Op  Plan: -bleeding issues resolved. -mechanical AVR: INR not therapeutic at 2.5 -discharge when INR at 2.5.    Virgina Jock, PA-C [facility] Office: [phone] Pager: [phone] 05/19/2014 4:10 PM  Addendum  I have independently interviewed and examined the patient, and I agree with the physician assistant's findings.  Home probably tomorrow  Adele Barthel, MD [facility] Office: [phone] Pager: [phone]  05/19/2014, 7:49  PM

## 2014-05-20 LAB — CBC
HCT: 31 % — ABNORMAL LOW (ref 36.0–46.0)
HCT: 29.3 % — ABNORMAL LOW (ref 36.0–46.0)
Hemoglobin: 9.8 g/dL — ABNORMAL LOW (ref 12.0–15.0)
Hemoglobin: 9.4 g/dL — ABNORMAL LOW (ref 12.0–15.0)
MCH: 30.3 pg (ref 26.0–34.0)
MCH: 30.6 pg (ref 26.0–34.0)
MCHC: 31.6 g/dL (ref 30.0–36.0)
MCHC: 32.1 g/dL (ref 30.0–36.0)
MCV: 96 fL (ref 78.0–100.0)
MCV: 95.4 fL (ref 78.0–100.0)
Platelets: 137 10*3/uL — ABNORMAL LOW (ref 150–400)
Platelets: 100 10*3/uL — ABNORMAL LOW (ref 150–400)
RBC: 3.23 MIL/uL — AB (ref 3.87–5.11)
RBC: 3.07 MIL/uL — AB (ref 3.87–5.11)
RDW: 15.4 % (ref 11.5–15.5)
RDW: 15.7 % — ABNORMAL HIGH (ref 11.5–15.5)
WBC: 5.5 10*3/uL (ref 4.0–10.5)
WBC: 5.6 10*3/uL (ref 4.0–10.5)

## 2014-05-20 LAB — RENAL FUNCTION PANEL
ANION GAP: 12 (ref 5–15)
Albumin: 2.7 g/dL — ABNORMAL LOW (ref 3.5–5.2)
BUN: 53 mg/dL — ABNORMAL HIGH (ref 6–23)
CALCIUM: 8.3 mg/dL — AB (ref 8.4–10.5)
CO2: 25 mmol/L (ref 19–32)
Chloride: 95 mmol/L — ABNORMAL LOW (ref 96–112)
Creatinine, Ser: 8.82 mg/dL — ABNORMAL HIGH (ref 0.50–1.10)
GFR calc non Af Amer: 4 mL/min — ABNORMAL LOW (ref >90)
GFR, EST AFRICAN AMERICAN: 5 mL/min — AB (ref >90)
GLUCOSE: 118 mg/dL — AB (ref 70–99)
POTASSIUM: 5.1 mmol/L (ref 3.5–5.1)
Phosphorus: 5.6 mg/dL — ABNORMAL HIGH (ref 2.3–4.6)
Sodium: 132 mmol/L — ABNORMAL LOW (ref 135–145)

## 2014-05-20 LAB — GLUCOSE, CAPILLARY
GLUCOSE-CAPILLARY: 109 mg/dL — AB (ref 70–99)
GLUCOSE-CAPILLARY: 111 mg/dL — AB (ref 70–99)
Glucose-Capillary: 193 mg/dL — ABNORMAL HIGH (ref 70–99)

## 2014-05-20 LAB — PROTIME-INR
INR: 2.49 — AB (ref 0.00–1.49)
Prothrombin Time: 27.1 seconds — ABNORMAL HIGH (ref 11.6–15.2)

## 2014-05-20 MED ORDER — TRAMADOL HCL 50 MG PO TABS: 50.0000 mg | ORAL_TABLET | Freq: Two times a day (BID) | ORAL | Status: DC | PRN

## 2014-05-20 MED ORDER — WARFARIN SODIUM 4 MG PO TABS: 4.0000 mg | ORAL_TABLET | Freq: Every day | ORAL | Status: DC

## 2014-05-20 MED ORDER — WARFARIN SODIUM 4 MG PO TABS
4.0000 mg | ORAL_TABLET | Freq: Once | ORAL | Status: DC
  Filled 2014-05-20: qty 1

## 2014-05-20 NOTE — Progress Notes (Signed)
Discussed discharge paperwork and medications. Answered all questions and took patient's IV out per protocol. Patient is going to eat lunch first and her daughter is going to pick her up. Glade Nurse, RN

## 2014-05-20 NOTE — Progress Notes (Signed)
Subjective:  Seen on dialysis, no complaints, anticipating discharge  Objective: Vital signs in last 24 hours: Temp:  [97.5 F (36.4 C)-97.9 F (36.6 C)] 97.8 F (36.6 C) (04/09 0754) Pulse Rate:  [59-72] 72 (04/09 0900) Resp:  [18-21] 20 (04/09 0900) BP: (125-178)/(42-80) 168/68 mmHg (04/09 0900) SpO2:  [99 %-100 %] 100 % (04/09 0754) Weight:  [81.6 kg (179 lb 14.3 oz)] 81.6 kg (179 lb 14.3 oz) (04/09 0754) Weight change:   Intake/Output from previous day: 04/08 0701 - 04/09 0700 In: 600 [P.O.:600] Out: -  Intake/Output this shift:   Lab Results:  Recent Labs  05/20/14 0417 05/20/14 0821  WBC 5.5 5.6  HGB 9.8* 9.4*  HCT 31.0* 29.3*  PLT 137* 100*   BMET:  Recent Labs  05/18/14 0801 05/20/14 0822  NA 130* 132*  K 5.2* 5.1  CL 93* 95*  CO2 23 25  GLUCOSE 122* 118*  BUN 50* 53*  CREATININE 9.22* 8.82*  CALCIUM 8.1* 8.3*  ALBUMIN 2.7* 2.7*   No results for input(s): PTH in the last 72 hours. Iron Studies: No results for input(s): IRON, TIBC, TRANSFERRIN, FERRITIN in the last 72 hours.  Studies/Results: No results found.   EXAM: General appearance:  Alert, in no apparent distress Resp:  CTA without rales, rhonchi, or wheezes Cardio:  RRR without murmur or rub, valve click GI:  + BS, soft and nontender Extremities:  No edema Access:  L thigh AVG with BFR 400  Dialysis Orders: TTS [name] 3hr 45 mins 78 kgs 2K/2.25Ca 400/1.5 No heparin Micera 75 q 2 weeks(last 3/22- next dose due 4/5) Calcitriol 0.5  Assessment/Plan: 1. Bleeding thigh AVG - s/p L thigh shuntogram PTA of venous stenosis 4/4 per Dr. Bridgett Larsson, repeat AF @ outpatient center. 2. ESRD - HD on TTS @ [facility], K 5.1.  HD today. 3. HTN/Volume - BP 168/68, Amlodipine 2.5 mg qd, Metoprolol 25 mg bid; wt 81.6 kg with UF goal 3 L today. 4. Anemia - Hgb down to 9.4, Aranesp 25 mcg received 4/5. 5. 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 6. Nutrition - Alb 2.7, renal diet, vitamin. 7. Liver transplant - on immunosuppression per primary. 8. Hx AVR - on Coumadin, INR 2.49. 9. Thrombocytopenia - Plts 100, no Heparin.   LOS: 7 days   LYLES,CHARLES 05/20/2014,10:43 AM  Pt seen, examined and agree w A/P as above.  [name] MD pager [phone]    cell [phone] 05/20/2014, 11:26 AM

## 2014-05-20 NOTE — Progress Notes (Signed)
  [facility] Progress Note  05/20/2014 9:20 AM 5 Days Post-Op  Subjective:  Seen in HD. No bleeding issues.    Filed Vitals:   05/20/14 0900  BP: 168/68  Pulse: 72  Temp:   Resp: 20    Physical Exam: Left thigh AVG in use.  CBC    Component Value Date/Time   WBC 5.6 05/20/2014 0821   RBC 3.07* 05/20/2014 0821   HGB 9.4* 05/20/2014 0821   HCT 29.3* 05/20/2014 0821   PLT PENDING 05/20/2014 0821   MCV 95.4 05/20/2014 0821   MCH 30.6 05/20/2014 0821   MCHC 32.1 05/20/2014 0821   RDW 15.7* 05/20/2014 0821   LYMPHSABS 1.3 04/04/2014 0503   MONOABS 0.6 04/04/2014 0503   EOSABS 0.1 04/04/2014 0503   BASOSABS 0.0 04/04/2014 0503    BMET    Component Value Date/Time   NA 132* 05/20/2014 0822   K 5.1 05/20/2014 0822   CL 95* 05/20/2014 0822   CO2 25 05/20/2014 0822   GLUCOSE 118* 05/20/2014 0822   BUN 53* 05/20/2014 0822   CREATININE 8.82* 05/20/2014 0822   CALCIUM 8.3* 05/20/2014 0822   GFRNONAA 4* 05/20/2014 0822   GFRAA 5* 05/20/2014 0822    INR    Component Value Date/Time   INR 2.49* 05/20/2014 0417   INR 3 03/03/2014     Intake/Output Summary (Last 24 hours) at 05/20/14 0920 Last data filed at 05/19/14 1500  Gross per 24 hour  Intake    360 ml  Output      0 ml  Net    360 ml     Assessment:  59 y.o. female is s/p: left thigh shuntogram and venoplasty of common vein 5 Days Post-Op  Plan: -Bleeding issues resolved. -Coumadin therapeutic today. -Will discharge home today after HD.  -Patient to follow up in 2 weeks. Advised her to get INR checked early next week.    Virgina Jock, PA-C [facility] Office: [phone] Pager: [phone] 05/20/2014 9:20 AM

## 2014-05-21 NOTE — Discharge Summary (Signed)
[facility] Discharge Summary  [name] [birth year] 59 y.o. female  [id number]  Admission Date: 05/11/2014  Discharge Date: 05/20/2014  Physician: Adele Barthel, MD  Admission Diagnosis: Complications due to renal dialysis device, implant, and graft, sequela  950600 instage renal  HPI:   This is a 59 y.o. female who presented to the [facility] ED on 05/11/14 for bleeding left AV thigh graft. She has had multiple bleeding episodes before. She previously presented to the [facility] ED on 03/30/14 for a similar episode and was taken to the OR for revision of her left thigh arteriovenous graft with interposition graft. Her bleeding started following completion of dialysis this morning at the cannulation sites. She then presented to the [facility] ED where her bleeding was unable to be controlled and was transferred to the [facility] ED. She was last seen in the [facility] office by Dr. Trula Slade on 05/01/14. Plans were made for shuntogram with possible intervention at the venous anastomosis with Dr. Bridgett Larsson on 05/15/14.   She is currently on coumadin for a mechanical heart valve but was stopped on Monday in anticipation for her procedure. She is currently on lovenox per her cardiologist Dr [name].   She has a past medical history of CAD, DM2, HTN and PAD all of which are stable.   Hospital Course:  The patient was admitted to the hospital with plans for shuntogram on 05/12/14 with Dr. Oneida Alar. She was kept on heparin. This was cancelled due to her INR being greater than 2 despite being off of coumadin for 5 days. She had no bleeding overnight. Plans were made to keep reschedule her shuntogram with Dr. Bridgett Larsson on 05/15/14 as originally planned. Nephrology was informed of her admission and were advised to administer FFP during dialysis if her INR was still elevated. Her INR was normalized on hospital day 3.   The patient was taken to the [place] lab on 05/15/14 and underwent left thigh shuntogram  with venoplasty of the left common femoral vein. She was started back on coumadin bridged with heparin. She continued to have some minimal oozing following dialysis but this was expected to be due to her anticoagulation. She did well the remainder of her hospital course and was discharged once her INR was therapeutic at 2.5. She was discharged on POD 5.   CBC    Component Value Date/Time   WBC 5.6 05/20/2014 0821   RBC 3.07* 05/20/2014 0821   HGB 9.4* 05/20/2014 0821   HCT 29.3* 05/20/2014 0821   PLT 100* 05/20/2014 0821   MCV 95.4 05/20/2014 0821   MCH 30.6 05/20/2014 0821   MCHC 32.1 05/20/2014 0821   RDW 15.7* 05/20/2014 0821   LYMPHSABS 1.3 04/04/2014 0503   MONOABS 0.6 04/04/2014 0503   EOSABS 0.1 04/04/2014 0503   BASOSABS 0.0 04/04/2014 0503    BMET    Component Value Date/Time   NA 132* 05/20/2014 0822   K 5.1 05/20/2014 0822   CL 95* 05/20/2014 0822   CO2 25 05/20/2014 0822   GLUCOSE 118* 05/20/2014 0822   BUN 53* 05/20/2014 0822   CREATININE 8.82* 05/20/2014 0822   CALCIUM 8.3* 05/20/2014 0822   GFRNONAA 4* 05/20/2014 0822   GFRAA 5* 05/20/2014 8850     Discharge Instructions:   The patient is discharged to home with extensive instructions on wound care and progressive ambulation.  They are instructed not to drive or perform any heavy lifting until returning to see the physician in his  office.  Discharge Instructions    Call MD for:  redness, tenderness, or signs of infection (pain, swelling, bleeding, redness, odor or green/yellow discharge around incision site)    Complete by:  As directed      Call MD for:  severe or increased pain, loss or decreased feeling  in affected limb(s)    Complete by:  As directed      Call MD for:  temperature >100.5    Complete by:  As directed      Discharge wound care:    Complete by:  As directed   Wash wounds daily with soap and water and pat dry.     Driving Restrictions    Complete by:  As directed   No driving while on  pain medication     Increase activity slowly    Complete by:  As directed   Walk with assistance use walker or cane as needed     Lifting restrictions    Complete by:  As directed   No lifting for 1 week     Resume previous diet    Complete by:  As directed            Discharge Diagnosis:  Complications due to renal dialysis device, implant, and graft, sequela  950600 instage renal  Secondary Diagnosis: Patient Active Problem List   Diagnosis Date Noted  . Complications due to renal dialysis device, implant, and graft 05/11/2014  . Arterial hemorrhage 03/31/2014  . Acute blood loss anemia 03/31/2014  . ESRD on dialysis 03/31/2014  . Renal dialysis device, implant, or graft complication 16/11/9602  . Bleeding 03/30/2014  . Chest pain 03/19/2014  . Acute hyperkalemia   . Dyspnea 03/07/2014  . Pain in the chest   . S/P liver transplant   . Chronic hepatitis C without hepatic coma   . Diastolic CHF, chronic   . Thyroid activity decreased   . ESRD (end stage renal disease) 01/03/2014  . Pulmonary edema 01/03/2014  . Hyperkalemia 01/03/2014  . Respiratory failure 01/03/2014  . Evaluate for Sleep apnea 12/31/2013  . DM type 2 (diabetes mellitus, type 2) 12/15/2013  . Unstable angina 12/15/2013  . Pleural effusion 07/03/2013  . Paroxysmal supraventricular tachycardia, s/p valve replacement, with chest pain. resolved as outpatient 05/13/2013  . 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 Long term current use of anticoagulant therapy 04/22/2013  . Chronic anticoagulation w/ coumadin, goal INR 2.5-3.0 w/ AVR 04/14/2013  . S/P mechanical AVR Feb 2015 03/15/2013  . Aortic stenosis 03/09/2013  . Palpitations- run of tachycardia after HD, lasted 15-20 minutes 03/08/2013  . Tobacco abuse 02/28/2013  . COPD (chronic obstructive pulmonary disease) 02/21/2013  . Hypoxia 02/21/2013  . Chronic combined systolic and diastolic congestive heart failure 02/06/2013  . Acute respiratory  failure 11/02/2012  . Acute pulmonary edema 11/02/2012  . Acute on chronic systolic CHF (congestive heart failure) 11/02/2012  . HCAP (healthcare-associated pneumonia) 08/31/2012  . COPD exacerbation 08/31/2012  . Dyslipidemia-LDL 104, not on statin with Hx of liver transplant 07/30/2012  . Abn EKG- related to LVH, repol changes 07/30/2012  . CAD- s/p LIMA-LAD Feb. 2015 07/27/2012  . Aortic stenosis, moderate-severe at cath Jan 2015. s/p Mechanical AVR 03/2013 07/27/2012  . End stage renal disease on dialysis-TTS 02/27/2011  . Acute hepatitis C virus infection 06/25/2006  . Hypothyroidism 06/25/2006  . DM2 (diabetes mellitus, type 2) 06/25/2006  . ANEMIA, CHRONIC 06/25/2006  . HTN- LVH, EF45- 50%. grade 2 diastolic  dysf. echo 07/28/12 06/25/2006  . GERD 06/25/2006   Past Medical History  Diagnosis Date  . 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 Chronic back pain   . CAD (coronary artery disease)   . Obesity   . Peripheral vascular disease hands and legs  . Anxiety   . Asthma   . GERD (gastroesophageal reflux disease)     takes Omeprazole daily  . Chronic constipation     takes MIralax and Colace daily  . Anemia     takes Folic Acid daily  . Hypothyroidism     takes Synthroid daily  . Depression     takes Cymbalta for "severe" depression  . Neuromuscular disorder     carpal tunnel in right hand  . Hypertension     takes Metoprolol and Lisinopril daily, sees Dr Bea Graff  . COPD (chronic obstructive pulmonary disease)   . Pneumonia     "today and several times before" (08/30/2012)  . Chronic bronchitis     "q yr w/season changes" (08/30/2012)  . Type II diabetes mellitus     Levemir 2units daily if > 150  . History of blood transfusion     "several" (08/30/2012)  . Hepatitis C   . Migraine     "last migraine was in 2013" (08/30/2012)  . Headache     "at least monthly" (08/30/2012)  . Arthritis      "left hand, back" (08/30/2012)  . End stage renal disease on dialysis 02/27/2011    Kidneys shut down at time of liver transplant in Sept 2011 at [facility] in [place], she has been on HD ever since.  Dialyzes at [facility] HD on TTS schedule.  Had L forearm graft used 10 months then removed Dec 2012 due to suspected infection.  A right upper arm AVG was placed Dec 2012 but she developed steal symptoms acutely and it was ligated the same day.  Never had an AV fistula due to small veins.  Now has L thigh AVG put in Jan 2013, has not clotted to date.    [name] CAD (coronary artery disease) Jan. 2015    Cath: 20% LAD, 50% D1; s/p LIMA-LAD  . Aortic stenosis   . Tobacco abuse   . S/P aortic valve replacement 03/15/13    Mechanical        Medication List    TAKE these medications        albuterol 90 MCG/ACT inhaler  Commonly known as:  PROVENTIL,VENTOLIN  Inhale 1-2 puffs into the lungs every 6 (six) hours as needed for wheezing or shortness of breath.     amLODipine 2.5 MG tablet  Commonly known as:  NORVASC  Take 1 tablet daily on non dialysis days.     aspirin EC 81 MG tablet  Take 81 mg by mouth daily.     calcium acetate 667 MG capsule  Commonly known as:  PHOSLO  Take 667-2,001 mg by mouth See admin instructions. Take 3 capsules (2001 mg) with meals and 1 capsule (667 mg) with snacks     cinacalcet 30 MG tablet  Commonly known as:  SENSIPAR  Take 30 mg by mouth daily.     enoxaparin 80 MG/0.8ML injection  Commonly known as:  LOVENOX  Inject 0.8 mLs (80 mg total) into the skin daily.     gabapentin 300 MG capsule  Commonly known as:  NEURONTIN  Take 300 mg by mouth at bedtime.     insulin aspart 100 UNIT/ML injection  Commonly known as:  novoLOG  Inject 1-3 Units into the skin 3 (three) times daily as needed for high blood sugar (CBG>150). Per sliding scale     levothyroxine 150 MCG tablet  Commonly known as:  SYNTHROID, LEVOTHROID  Take 150 mcg by mouth daily before breakfast.      magnesium oxide 400 MG tablet  Commonly known as:  MAG-OX  Take 400 mg by mouth daily.     metoprolol tartrate 25 MG tablet  Commonly known as:  LOPRESSOR  Take 25 mg by mouth 2 (two) times daily.     nitroGLYCERIN 0.4 MG SL tablet  Commonly known as:  NITROSTAT  Place 1 tablet (0.4 mg total) under the tongue every 5 (five) minutes as needed for chest pain.     ondansetron 4 MG disintegrating tablet  Commonly known as:  ZOFRAN-ODT  Take 4 mg by mouth every 8 (eight) hours as needed for nausea or vomiting.     tacrolimus 1 MG capsule  Commonly known as:  PROGRAF  Take 3 mg by mouth 2 (two) times daily.     traMADol 50 MG tablet  Commonly known as:  ULTRAM  Take 1 tablet (50 mg total) by mouth every 12 (twelve) hours as needed for moderate pain.     warfarin 4 MG tablet  Commonly known as:  COUMADIN  Take 1 tablet (4 mg total) by mouth daily at 6 PM.        Tramadol #20 No Refill  Disposition: Home  Patient's condition: is Good  Follow up: 1. Dr. Bridgett Larsson in 2 weeks 2. INR check next Monday or Tuesday   Virgina Jock, [place] [facility] [phone] 05/21/2014  10:33 AM  Addendum  I have independently interviewed and examined the patient, and I agree with the physician assistant's findings.  This patient was admited with bleeding from her L thigh AVG.  Her coumadin was allowed to reversed with time given her mechanical valve while on heparin drip.  Her L thigh shuntogram demonstrated a CFV stenosis which was dilated to 5.0 mm.  The remaining hospitalization was reloading coumadin while on heparin drip for her mechanical heart valve.  Adele Barthel, MD [facility] [place] Office: [phone] Pager: [phone]  05/22/2014, 7:23 AM

## 2014-05-23 DIAGNOSIS — D631 Anemia in chronic kidney disease: Secondary | ICD-10-CM | POA: Diagnosis not present

## 2014-05-23 DIAGNOSIS — E118 Type 2 diabetes mellitus with unspecified complications: Secondary | ICD-10-CM | POA: Diagnosis not present

## 2014-05-23 DIAGNOSIS — N2581 Secondary hyperparathyroidism of renal origin: Secondary | ICD-10-CM | POA: Diagnosis not present

## 2014-05-23 DIAGNOSIS — N186 End stage renal disease: Secondary | ICD-10-CM | POA: Diagnosis not present

## 2014-05-23 DIAGNOSIS — I359 Nonrheumatic aortic valve disorder, unspecified: Secondary | ICD-10-CM | POA: Diagnosis not present

## 2014-05-23 LAB — PROTIME-INR: INR: 3.9 — AB (ref 0.9–1.1)

## 2014-05-24 ENCOUNTER — Ambulatory Visit (INDEPENDENT_AMBULATORY_CARE_PROVIDER_SITE_OTHER): Payer: Medicare Other | Admitting: Pharmacist Clinician (PhC)/ Clinical Pharmacy Specialist

## 2014-05-24 DIAGNOSIS — Z7901 Long term (current) use of anticoagulants: Secondary | ICD-10-CM

## 2014-05-24 DIAGNOSIS — Z954 Presence of other heart-valve replacement: Secondary | ICD-10-CM

## 2014-05-24 DIAGNOSIS — Z952 Presence of prosthetic heart valve: Secondary | ICD-10-CM

## 2014-05-25 DIAGNOSIS — N186 End stage renal disease: Secondary | ICD-10-CM | POA: Diagnosis not present

## 2014-05-25 DIAGNOSIS — D631 Anemia in chronic kidney disease: Secondary | ICD-10-CM | POA: Diagnosis not present

## 2014-05-25 DIAGNOSIS — E118 Type 2 diabetes mellitus with unspecified complications: Secondary | ICD-10-CM | POA: Diagnosis not present

## 2014-05-25 DIAGNOSIS — N2581 Secondary hyperparathyroidism of renal origin: Secondary | ICD-10-CM | POA: Diagnosis not present

## 2014-05-27 DIAGNOSIS — N186 End stage renal disease: Secondary | ICD-10-CM | POA: Diagnosis not present

## 2014-05-27 DIAGNOSIS — N2581 Secondary hyperparathyroidism of renal origin: Secondary | ICD-10-CM | POA: Diagnosis not present

## 2014-05-27 DIAGNOSIS — E118 Type 2 diabetes mellitus with unspecified complications: Secondary | ICD-10-CM | POA: Diagnosis not present

## 2014-05-27 DIAGNOSIS — D631 Anemia in chronic kidney disease: Secondary | ICD-10-CM | POA: Diagnosis not present

## 2014-05-30 DIAGNOSIS — N186 End stage renal disease: Secondary | ICD-10-CM | POA: Diagnosis not present

## 2014-05-30 DIAGNOSIS — I359 Nonrheumatic aortic valve disorder, unspecified: Secondary | ICD-10-CM | POA: Diagnosis not present

## 2014-05-30 DIAGNOSIS — N2581 Secondary hyperparathyroidism of renal origin: Secondary | ICD-10-CM | POA: Diagnosis not present

## 2014-05-30 DIAGNOSIS — E118 Type 2 diabetes mellitus with unspecified complications: Secondary | ICD-10-CM | POA: Diagnosis not present

## 2014-05-30 DIAGNOSIS — D631 Anemia in chronic kidney disease: Secondary | ICD-10-CM | POA: Diagnosis not present

## 2014-05-30 LAB — PROTIME-INR: INR: 3.2 — AB (ref 0.9–1.1)

## 2014-05-31 ENCOUNTER — Other Ambulatory Visit: Payer: Self-pay | Admitting: Cardiology

## 2014-05-31 LAB — POCT INR: INR: 3.2

## 2014-06-01 ENCOUNTER — Ambulatory Visit (INDEPENDENT_AMBULATORY_CARE_PROVIDER_SITE_OTHER): Payer: Medicare Other | Admitting: Pharmacist Clinician (PhC)/ Clinical Pharmacy Specialist

## 2014-06-01 DIAGNOSIS — N2581 Secondary hyperparathyroidism of renal origin: Secondary | ICD-10-CM | POA: Diagnosis not present

## 2014-06-01 DIAGNOSIS — E118 Type 2 diabetes mellitus with unspecified complications: Secondary | ICD-10-CM | POA: Diagnosis not present

## 2014-06-01 DIAGNOSIS — Z7901 Long term (current) use of anticoagulants: Secondary | ICD-10-CM

## 2014-06-01 DIAGNOSIS — D631 Anemia in chronic kidney disease: Secondary | ICD-10-CM | POA: Diagnosis not present

## 2014-06-01 DIAGNOSIS — Z954 Presence of other heart-valve replacement: Secondary | ICD-10-CM

## 2014-06-01 DIAGNOSIS — Z952 Presence of prosthetic heart valve: Secondary | ICD-10-CM

## 2014-06-01 DIAGNOSIS — N186 End stage renal disease: Secondary | ICD-10-CM | POA: Diagnosis not present

## 2014-06-03 DIAGNOSIS — D631 Anemia in chronic kidney disease: Secondary | ICD-10-CM | POA: Diagnosis not present

## 2014-06-03 DIAGNOSIS — N2581 Secondary hyperparathyroidism of renal origin: Secondary | ICD-10-CM | POA: Diagnosis not present

## 2014-06-03 DIAGNOSIS — E118 Type 2 diabetes mellitus with unspecified complications: Secondary | ICD-10-CM | POA: Diagnosis not present

## 2014-06-03 DIAGNOSIS — N186 End stage renal disease: Secondary | ICD-10-CM | POA: Diagnosis not present

## 2014-06-05 DIAGNOSIS — K769 Liver disease, unspecified: Secondary | ICD-10-CM | POA: Diagnosis not present

## 2014-06-05 DIAGNOSIS — Z944 Liver transplant status: Secondary | ICD-10-CM | POA: Diagnosis not present

## 2014-06-05 DIAGNOSIS — Z79899 Other long term (current) drug therapy: Secondary | ICD-10-CM | POA: Diagnosis not present

## 2014-06-06 DIAGNOSIS — D631 Anemia in chronic kidney disease: Secondary | ICD-10-CM | POA: Diagnosis not present

## 2014-06-06 DIAGNOSIS — N2581 Secondary hyperparathyroidism of renal origin: Secondary | ICD-10-CM | POA: Diagnosis not present

## 2014-06-06 DIAGNOSIS — I359 Nonrheumatic aortic valve disorder, unspecified: Secondary | ICD-10-CM | POA: Diagnosis not present

## 2014-06-06 DIAGNOSIS — N186 End stage renal disease: Secondary | ICD-10-CM | POA: Diagnosis not present

## 2014-06-06 DIAGNOSIS — E118 Type 2 diabetes mellitus with unspecified complications: Secondary | ICD-10-CM | POA: Diagnosis not present

## 2014-06-06 LAB — PROTIME-INR: INR: 1.6 — AB (ref 0.9–1.1)

## 2014-06-08 ENCOUNTER — Encounter: Payer: Self-pay | Admitting: Vascular Surgery

## 2014-06-08 ENCOUNTER — Ambulatory Visit (INDEPENDENT_AMBULATORY_CARE_PROVIDER_SITE_OTHER): Payer: Medicare Other | Admitting: Pharmacist Clinician (PhC)/ Clinical Pharmacy Specialist

## 2014-06-08 DIAGNOSIS — N2581 Secondary hyperparathyroidism of renal origin: Secondary | ICD-10-CM | POA: Diagnosis not present

## 2014-06-08 DIAGNOSIS — D631 Anemia in chronic kidney disease: Secondary | ICD-10-CM | POA: Diagnosis not present

## 2014-06-08 DIAGNOSIS — E118 Type 2 diabetes mellitus with unspecified complications: Secondary | ICD-10-CM | POA: Diagnosis not present

## 2014-06-08 DIAGNOSIS — N186 End stage renal disease: Secondary | ICD-10-CM | POA: Diagnosis not present

## 2014-06-08 DIAGNOSIS — Z954 Presence of other heart-valve replacement: Secondary | ICD-10-CM

## 2014-06-08 DIAGNOSIS — Z7901 Long term (current) use of anticoagulants: Secondary | ICD-10-CM

## 2014-06-08 DIAGNOSIS — Z952 Presence of prosthetic heart valve: Secondary | ICD-10-CM

## 2014-06-08 DIAGNOSIS — I359 Nonrheumatic aortic valve disorder, unspecified: Secondary | ICD-10-CM | POA: Diagnosis not present

## 2014-06-09 ENCOUNTER — Ambulatory Visit (INDEPENDENT_AMBULATORY_CARE_PROVIDER_SITE_OTHER): Payer: Medicare Other | Admitting: Pharmacist Clinician (PhC)/ Clinical Pharmacy Specialist

## 2014-06-09 ENCOUNTER — Emergency Department (HOSPITAL_COMMUNITY)
Admission: EM | Admit: 2014-06-09 | Discharge: 2014-06-09 | Disposition: A | Discharge status: Home / Self Care | Payer: Medicare Other | Attending: Emergency Medicine | Admitting: Emergency Medicine

## 2014-06-09 ENCOUNTER — Encounter (HOSPITAL_COMMUNITY): Payer: Self-pay

## 2014-06-09 ENCOUNTER — Emergency Department (HOSPITAL_COMMUNITY): Payer: Medicare Other

## 2014-06-09 ENCOUNTER — Encounter: Payer: Medicare Other | Admitting: Vascular Surgery

## 2014-06-09 DIAGNOSIS — I499 Cardiac arrhythmia, unspecified: Secondary | ICD-10-CM | POA: Diagnosis not present

## 2014-06-09 DIAGNOSIS — M1990 Unspecified osteoarthritis, unspecified site: Secondary | ICD-10-CM | POA: Insufficient documentation

## 2014-06-09 DIAGNOSIS — I471 Supraventricular tachycardia, unspecified: Secondary | ICD-10-CM

## 2014-06-09 DIAGNOSIS — J449 Chronic obstructive pulmonary disease, unspecified: Secondary | ICD-10-CM | POA: Diagnosis not present

## 2014-06-09 DIAGNOSIS — I120 Hypertensive chronic kidney disease with stage 5 chronic kidney disease or end stage renal disease: Secondary | ICD-10-CM | POA: Diagnosis not present

## 2014-06-09 DIAGNOSIS — E119 Type 2 diabetes mellitus without complications: Secondary | ICD-10-CM | POA: Diagnosis not present

## 2014-06-09 DIAGNOSIS — F419 Anxiety disorder, unspecified: Secondary | ICD-10-CM | POA: Insufficient documentation

## 2014-06-09 DIAGNOSIS — Z8701 Personal history of pneumonia (recurrent): Secondary | ICD-10-CM | POA: Diagnosis not present

## 2014-06-09 DIAGNOSIS — Z7901 Long term (current) use of anticoagulants: Secondary | ICD-10-CM | POA: Diagnosis not present

## 2014-06-09 DIAGNOSIS — E669 Obesity, unspecified: Secondary | ICD-10-CM | POA: Diagnosis not present

## 2014-06-09 DIAGNOSIS — G8929 Other chronic pain: Secondary | ICD-10-CM | POA: Diagnosis not present

## 2014-06-09 DIAGNOSIS — Z79899 Other long term (current) drug therapy: Secondary | ICD-10-CM | POA: Insufficient documentation

## 2014-06-09 DIAGNOSIS — N186 End stage renal disease: Secondary | ICD-10-CM | POA: Diagnosis not present

## 2014-06-09 DIAGNOSIS — Z952 Presence of prosthetic heart valve: Secondary | ICD-10-CM

## 2014-06-09 DIAGNOSIS — Z862 Personal history of diseases of the blood and blood-forming organs and certain disorders involving the immune mechanism: Secondary | ICD-10-CM | POA: Diagnosis not present

## 2014-06-09 DIAGNOSIS — F329 Major depressive disorder, single episode, unspecified: Secondary | ICD-10-CM | POA: Diagnosis not present

## 2014-06-09 DIAGNOSIS — Z794 Long term (current) use of insulin: Secondary | ICD-10-CM | POA: Diagnosis not present

## 2014-06-09 DIAGNOSIS — R079 Chest pain, unspecified: Secondary | ICD-10-CM

## 2014-06-09 DIAGNOSIS — Z992 Dependence on renal dialysis: Secondary | ICD-10-CM | POA: Diagnosis not present

## 2014-06-09 DIAGNOSIS — E039 Hypothyroidism, unspecified: Secondary | ICD-10-CM | POA: Diagnosis not present

## 2014-06-09 DIAGNOSIS — Z720 Tobacco use: Secondary | ICD-10-CM | POA: Diagnosis not present

## 2014-06-09 DIAGNOSIS — Z954 Presence of other heart-valve replacement: Secondary | ICD-10-CM

## 2014-06-09 DIAGNOSIS — I2510 Atherosclerotic heart disease of native coronary artery without angina pectoris: Secondary | ICD-10-CM | POA: Insufficient documentation

## 2014-06-09 DIAGNOSIS — G43909 Migraine, unspecified, not intractable, without status migrainosus: Secondary | ICD-10-CM | POA: Insufficient documentation

## 2014-06-09 DIAGNOSIS — K219 Gastro-esophageal reflux disease without esophagitis: Secondary | ICD-10-CM | POA: Insufficient documentation

## 2014-06-09 HISTORY — DX: Supraventricular tachycardia, unspecified: I47.10

## 2014-06-09 HISTORY — DX: Supraventricular tachycardia: I47.1

## 2014-06-09 LAB — COMPREHENSIVE METABOLIC PANEL
ALT: 22 U/L (ref 0–35)
AST: 46 U/L — ABNORMAL HIGH (ref 0–37)
Albumin: 3.1 g/dL — ABNORMAL LOW (ref 3.5–5.2)
Alkaline Phosphatase: 98 U/L (ref 39–117)
Anion gap: 17 — ABNORMAL HIGH (ref 5–15)
BUN: 26 mg/dL — AB (ref 6–23)
CHLORIDE: 92 mmol/L — AB (ref 96–112)
CO2: 24 mmol/L (ref 19–32)
Calcium: 8.4 mg/dL (ref 8.4–10.5)
Creatinine, Ser: 6.48 mg/dL — ABNORMAL HIGH (ref 0.50–1.10)
GFR calc non Af Amer: 6 mL/min — ABNORMAL LOW (ref >90)
GFR, EST AFRICAN AMERICAN: 7 mL/min — AB (ref >90)
Glucose, Bld: 228 mg/dL — ABNORMAL HIGH (ref 70–99)
Potassium: 3.6 mmol/L (ref 3.5–5.1)
Sodium: 133 mmol/L — ABNORMAL LOW (ref 135–145)
TOTAL PROTEIN: 7.6 g/dL (ref 6.0–8.3)
Total Bilirubin: 0.6 mg/dL (ref 0.3–1.2)

## 2014-06-09 LAB — CBC WITH DIFFERENTIAL/PLATELET
Basophils Absolute: 0.1 10*3/uL (ref 0.0–0.1)
Basophils Relative: 1 % (ref 0–1)
EOS ABS: 0.1 10*3/uL (ref 0.0–0.7)
Eosinophils Relative: 2 % (ref 0–5)
HCT: 36.4 % (ref 36.0–46.0)
HEMOGLOBIN: 11.8 g/dL — AB (ref 12.0–15.0)
LYMPHS ABS: 1.4 10*3/uL (ref 0.7–4.0)
Lymphocytes Relative: 25 % (ref 12–46)
MCH: 31.3 pg (ref 26.0–34.0)
MCHC: 32.4 g/dL (ref 30.0–36.0)
MCV: 96.6 fL (ref 78.0–100.0)
Monocytes Absolute: 0.6 10*3/uL (ref 0.1–1.0)
Monocytes Relative: 11 % (ref 3–12)
NEUTROS ABS: 3.5 10*3/uL (ref 1.7–7.7)
NEUTROS PCT: 61 % (ref 43–77)
PLATELETS: DECREASED 10*3/uL (ref 150–400)
RBC: 3.77 MIL/uL — AB (ref 3.87–5.11)
RDW: 15.8 % — AB (ref 11.5–15.5)
WBC: 5.7 10*3/uL (ref 4.0–10.5)

## 2014-06-09 LAB — TROPONIN I
TROPONIN I: 0.05 ng/mL — AB (ref <0.031)
Troponin I: 0.05 ng/mL — ABNORMAL HIGH (ref <0.031)

## 2014-06-09 LAB — PROTIME-INR
INR: 1.25 (ref 0.00–1.49)
Prothrombin Time: 15.9 seconds — ABNORMAL HIGH (ref 11.6–15.2)

## 2014-06-09 LAB — APTT: aPTT: 28 seconds (ref 24–37)

## 2014-06-09 LAB — BRAIN NATRIURETIC PEPTIDE: B Natriuretic Peptide: 351.9 pg/mL — ABNORMAL HIGH (ref 0.0–100.0)

## 2014-06-09 MED ORDER — ADENOSINE 6 MG/2ML IV SOLN
6.0000 mg | Freq: Once | INTRAVENOUS | Status: AC
  Administered 2014-06-09: 6 mg via INTRAVENOUS

## 2014-06-09 NOTE — Discharge Instructions (Signed)
Call and make an appointment to follow-up next week with your cardiologist. Return immediately for worsening chest pain, shortness of breath, palpitations or for any concerns.  Supraventricular Tachycardia Supraventricular tachycardia (SVT) is an abnormal heart rhythm (arrhythmia) that causes the heart to beat very fast (tachycardia). This kind of fast heartbeat originates in the upper chambers of the heart (atria). SVT can cause the heart to beat greater than 100 beats per minute. SVT can have a rapid burst of heartbeats. This can start and stop suddenly without warning and is called nonsustained. SVT can also be sustained, in which the heart beats at a continuous fast rate.  CAUSES  There can be different causes of SVT. Some of these include:  Heart valve problems such as mitral valve prolapse.  An enlarged heart (hypertrophic cardiomyopathy).  Congenital heart problems.  Heart inflammation (pericarditis).  Hyperthyroidism.  Low potassium or magnesium levels.  Caffeine.  Drug use such as cocaine, methamphetamines, or stimulants.  Some over-the-counter medicines such as:  Decongestants.  Diet medicines.  Herbal medicines. SYMPTOMS  Symptoms of SVT can vary. Symptoms depend on whether the SVT is sustained or nonsustained. You may experience:  No symptoms (asymptomatic).  An awareness of your heart beating rapidly (palpitations).  Shortness of breath.  Chest pain or pressure. If your blood pressure drops because of the SVT, you may experience:  Fainting or near fainting.  Weakness.  Dizziness. DIAGNOSIS  Different tests can be performed to diagnose SVT, such as:  An electrocardiogram (EKG). This is a painless test that records the electrical activity of your heart.  Holter monitor. This is a 24 hour recording of your heart rhythm. You will be given a diary. Write down all symptoms that you have and what you were doing at the time you experienced  symptoms.  Arrhythmia monitor. This is a small device that your wear for several weeks. It records the heart rhythm when you have symptoms.  Echocardiogram. This is an imaging test to help detect abnormal heart structure such as congenital abnormalities, heart valve problems, or heart enlargement.  Stress test. This test can help determine if the SVT is related to exercise.  Electrophysiology study (EPS). This is a procedure that evaluates your heart's electrical system and can help your caregiver find the cause of your SVT. TREATMENT  Treatment of SVT depends on the symptoms, how often it recurs, and whether there are any underlying heart problems.   If symptoms are rare and no other cardiac disease is present, no treatment may be needed.  Blood work may be done to check potassium, magnesium, and thyroid hormone levels to see if they are abnormal. If these levels are abnormal, treatment to correct the problems will occur. Medicines Your caregiver may use oral medicines to treat SVT. These medicines are given for long-term control of SVT. Medicines may be used alone or in combination with other treatments. These medicines work to slow nerve impulses in the heart muscle. These medicines can also be used to treat high blood pressure. Some of these medicines may include:  Calcium channel blockers.  Beta blockers.  Digoxin. Nonsurgical procedures Nonsurgical techniques may be used if oral medicines do not work. Some examples include:  Cardioversion. This technique uses either drugs or an electrical shock to restore a normal heart rhythm.  Cardioversion drugs may be given through an intravenous (IV) line to help "reset" the heart rhythm.  In electrical cardioversion, the caregiver shocks your heart to stop its beat for a split second.  This helps to reset the heart to a normal rhythm.  Ablation. This procedure is done under mild sedation. High frequency radio wave energy is used to destroy  the area of heart tissue responsible for the SVT. HOME CARE INSTRUCTIONS   Do not smoke.  Only take medicines prescribed by your caregiver. Check with your caregiver before using over-the-counter medicines.  Check with your caregiver about how much alcohol and caffeine (coffee, tea, colas, or chocolate) you may have.  It is very important to keep all follow-up referrals and appointments in order to properly manage this problem. SEEK IMMEDIATE MEDICAL CARE IF:  You have dizziness.  You faint or nearly faint.  You have shortness of breath.  You have chest pain or pressure.  You have sudden nausea or vomiting.  You have profuse sweating.  You are concerned about how long your symptoms last.  You are concerned about the frequency of your SVT episodes. If you have the above symptoms, call your local emergency services (911 in [place] immediately. Do not drive yourself to the hospital. MAKE SURE YOU:   Understand these instructions.  Will watch your condition.  Will get help right away if you are not doing well or get worse. Document Released: 01/27/2005 Document Revised: 04/21/2011 Document Reviewed: 05/11/2008 [facility] Patient Information 2015 [place], [place]. This information is not intended to replace advice given to you by your health care provider. Make sure you discuss any questions you have with your health care provider.  Chest Pain (Nonspecific) It is often hard to give a specific diagnosis for the cause of chest pain. There is always a chance that your pain could be related to something serious, such as a heart attack or a blood clot in the lungs. You need to follow up with your health care provider for further evaluation. CAUSES   Heartburn.  Pneumonia or bronchitis.  Anxiety or stress.  Inflammation around your heart (pericarditis) or lung (pleuritis or pleurisy).  A blood clot in the lung.  A collapsed lung (pneumothorax). It can develop suddenly on its own  (spontaneous pneumothorax) or from trauma to the chest.  Shingles infection (herpes zoster virus). The chest wall is composed of bones, muscles, and cartilage. Any of these can be the source of the pain.  The bones can be bruised by injury.  The muscles or cartilage can be strained by coughing or overwork.  The cartilage can be affected by inflammation and become sore (costochondritis). DIAGNOSIS  Lab tests or other studies may be needed to find the cause of your pain. Your health care provider may have you take a test called an ambulatory electrocardiogram (ECG). An ECG records your heartbeat patterns over a 24-hour period. You may also have other tests, such as:  Transthoracic echocardiogram (TTE). During echocardiography, sound waves are used to evaluate how blood flows through your heart.  Transesophageal echocardiogram (TEE).  Cardiac monitoring. This allows your health care provider to monitor your heart rate and rhythm in real time.  Holter monitor. This is a portable device that records your heartbeat and can help diagnose heart arrhythmias. It allows your health care provider to track your heart activity for several days, if needed.  Stress tests by exercise or by giving medicine that makes the heart beat faster. TREATMENT   Treatment depends on what may be causing your chest pain. Treatment may include:  Acid blockers for heartburn.  Anti-inflammatory medicine.  Pain medicine for inflammatory conditions.  Antibiotics if an infection is present.  You may be advised to change lifestyle habits. This includes stopping smoking and avoiding alcohol, caffeine, and chocolate.  You may be advised to keep your head raised (elevated) when sleeping. This reduces the chance of acid going backward from your stomach into your esophagus. Most of the time, nonspecific chest pain will improve within 2-3 days with rest and mild pain medicine.  HOME CARE INSTRUCTIONS   If antibiotics  were prescribed, take them as directed. Finish them even if you start to feel better.  For the next few days, avoid physical activities that bring on chest pain. Continue physical activities as directed.  Do not use any tobacco products, including cigarettes, chewing tobacco, or electronic cigarettes.  Avoid drinking alcohol.  Only take medicine as directed by your health care provider.  Follow your health care provider's suggestions for further testing if your chest pain does not go away.  Keep any follow-up appointments you made. If you do not go to an appointment, you could develop lasting (chronic) problems with pain. If there is any problem keeping an appointment, call to reschedule. SEEK MEDICAL CARE IF:   Your chest pain does not go away, even after treatment.  You have a rash with blisters on your chest.  You have a fever. SEEK IMMEDIATE MEDICAL CARE IF:   You have increased chest pain or pain that spreads to your arm, neck, jaw, back, or abdomen.  You have shortness of breath.  You have an increasing cough, or you cough up blood.  You have severe back or abdominal pain.  You feel nauseous or vomit.  You have severe weakness.  You faint.  You have chills. This is an emergency. Do not wait to see if the pain will go away. Get medical help at once. Call your local emergency services (911 in [place] Do not drive yourself to the hospital. MAKE SURE YOU:   Understand these instructions.  Will watch your condition.  Will get help right away if you are not doing well or get worse. Document Released: 11/06/2004 Document Revised: 02/01/2013 Document Reviewed: 09/02/2007 [facility] Patient Information 2015 [place], [place]. This information is not intended to replace advice given to you by your health care provider. Make sure you discuss any questions you have with your health care provider.

## 2014-06-09 NOTE — ED Provider Notes (Signed)
CSN: [id number]     Arrival date & time 06/09/14  0431 History   First MD Initiated Contact with Patient 06/09/14 [phone]     Chief Complaint  Patient presents with  . Tachycardia  . Chest Pain     (Consider location/radiation/quality/duration/timing/severity/associated sxs/prior Treatment) HPI Patient with acute onset palpitations, left chest pain and shortness of breath starting at 2 AM awakening her from sleep. She's had similar episodes in the past without diagnosis. Chest pain does not radiate. No cough, fever or chills. No lower extremity swelling. Patient is on hemodialysis every Tuesday, Thursday and Saturday. Last received dialysis yesterday. Per EMS patient tachycardic in the 160s. Blood pressure stable. Unable to obtain IV access. Past Medical History  Diagnosis Date  . 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 Chronic back pain   . CAD (coronary artery disease)   . Obesity   . Peripheral vascular disease hands and legs  . Anxiety   . Asthma   . GERD (gastroesophageal reflux disease)     takes Omeprazole daily  . Chronic constipation     takes MIralax and Colace daily  . Anemia     takes Folic Acid daily  . Hypothyroidism     takes Synthroid daily  . Depression     takes Cymbalta for "severe" depression  . Neuromuscular disorder     carpal tunnel in right hand  . Hypertension     takes Metoprolol and Lisinopril daily, sees Dr Bea Graff  . COPD (chronic obstructive pulmonary disease)   . Pneumonia     "today and several times before" (08/30/2012)  . Chronic bronchitis     "q yr w/season changes" (08/30/2012)  . Type II diabetes mellitus     Levemir 2units daily if > 150  . History of blood transfusion     "several" (08/30/2012)  . Hepatitis C   . Migraine     "last migraine was in 2013" (08/30/2012)  . Headache     "at least monthly" (08/30/2012)  . Arthritis     "left hand, back" (08/30/2012)   . End stage renal disease on dialysis 02/27/2011    Kidneys shut down at time of liver transplant in Sept 2011 at [facility] in [place], she has been on HD ever since.  Dialyzes at [facility] HD on TTS schedule.  Had L forearm graft used 10 months then removed Dec 2012 due to suspected infection.  A right upper arm AVG was placed Dec 2012 but she developed steal symptoms acutely and it was ligated the same day.  Never had an AV fistula due to small veins.  Now has L thigh AVG put in Jan 2013, has not clotted to date.    [name] CAD (coronary artery disease) Jan. 2015    Cath: 20% LAD, 50% D1; s/p LIMA-LAD  . Aortic stenosis   . Tobacco abuse   . S/P aortic valve replacement 03/15/13    Mechanical   . SVT (supraventricular tachycardia) 06/09/14   Past Surgical History  Procedure Laterality Date  . Liver transplant  10/25/2009    sees Dr Ferol Luz 1 every 6 months, saw last in Dec 2013. [name] Coord [phone]  . Small intestine surgery  90's  . Thrombectomy    . Arteriovenous graft placement Left 10/03/10     forearm  . Avgg removal  12/23/2010    Procedure:  REMOVAL OF ARTERIOVENOUS GORETEX GRAFT ([place]);  Surgeon: Elam Dutch, MD;  Location: [place];  Service: Vascular;  Laterality: Left;  procedure started @1736 -1852  . Insertion of dialysis catheter  12/23/2010    Procedure: INSERTION OF DIALYSIS CATHETER;  Surgeon: Elam Dutch, MD;  Location: [place];  Service: Vascular;  Laterality: Right;  Right Internal Jugular 28cm dialysis catheter insertion procedure time 1701-1720   . Cholecystectomy  1993  . Cystoscopy  1990's  . Spinal growth rods  2010    "put 2 metal rods in my back; they had detetriorated" (08/30/2012)  . Av fistula placement  01/29/2011    Procedure: INSERTION OF ARTERIOVENOUS (AV) GORE-TEX GRAFT ARM;  Surgeon: Elam Dutch, MD;  Location: [place];  Service: Vascular;  Laterality: Right;  . Av fistula placement  03/10/2011    Procedure: INSERTION OF ARTERIOVENOUS (AV)  GORE-TEX GRAFT THIGH;  Surgeon: Elam Dutch, MD;  Location: [place];  Service: Vascular;  Laterality: Left;  . Tubal ligation  1990's  . Aortic valve replacement N/A 03/15/2013    AVR; Surgeon: Ivin Poot, MD;  Location: [facility] OR; Open Heart Surgery;  64mm[employer] mechanical prosthesis, top hat valve  . Coronary artery bypass graft N/A 03/15/2013    Procedure: CORONARY ARTERY BYPASS GRAFTING (CABG) times one using left internal mammary artery.;  Surgeon: Ivin Poot, MD;  Location: [place];  Service: Open Heart Surgery;  Laterality: N/A;  POSS CABG X 1  . Intraoperative transesophageal echocardiogram N/A 03/15/2013    Procedure: INTRAOPERATIVE TRANSESOPHAGEAL ECHOCARDIOGRAM;  Surgeon: Ivin Poot, MD;  Location: [place];  Service: Open Heart Surgery;  Laterality: N/A;  . Left heart catheterization with coronary/graft angiogram N/A 12/24/2011    Procedure: LEFT HEART CATHETERIZATION WITH Beatrix Fetters;  Surgeon: [name], MD;  Location: [facility] CATH LAB;  Service: Cardiovascular;  Laterality: N/A;  . Left heart catheterization with coronary angiogram N/A 07/29/2012    Procedure: LEFT HEART CATHETERIZATION WITH CORONARY ANGIOGRAM;  Surgeon: Troy Sine, MD;  Location: [facility] CATH LAB;  Service: Cardiovascular;  Laterality: N/A;  . Left heart catheterization with coronary angiogram N/A 03/10/2013    Procedure: LEFT HEART CATHETERIZATION WITH CORONARY ANGIOGRAM;  Surgeon: Troy Sine, MD;  Location: [facility] CATH LAB;  Service: Cardiovascular;  Laterality: N/A;  . Left heart catheterization with coronary/graft angiogram N/A 12/16/2013    Procedure: LEFT HEART CATHETERIZATION WITH Beatrix Fetters;  Surgeon: Troy Sine, MD;  Location: [facility] CATH LAB;  Service: Cardiovascular;  Laterality: N/A;  . Thrombectomy and revision of arterioventous (av) goretex  graft Left 03/30/2014  . Thrombectomy and revision of arterioventous (av) goretex  graft Left 03/30/2014    Procedure: THROMBECTOMY  AND REVISION OF ARTERIOVENTOUS (AV) GORETEX  GRAFT;  Surgeon: Conrad Valley Home, MD;  Location: [place];  Service: Vascular;  Laterality: Left;  . Shuntogram Left 05/15/2014    Procedure: SHUNTOGRAM;  Surgeon: Conrad Longstreet, MD;  Location: [facility] CATH LAB;  Service: Cardiovascular;  Laterality: Left;   Family History  Problem Relation Age of Onset  . Cancer Mother   . Diabetes Mother   . Hypertension Mother   . Stroke Mother   . Cancer Father   . Anesthesia problems Neg Hx   . Hypotension Neg Hx   . Malignant hyperthermia Neg Hx   . Pseudochol deficiency Neg Hx    History  Substance Use Topics  . Smoking status: Current Some Day Smoker -- 0.75 packs/day for 40 years  Types: Cigarettes    Last Attempt to Quit: 03/13/2013  . Smokeless tobacco: Never Used     Comment: still smokng 1 or 2 a day  . Alcohol Use: Yes     Comment: 08/30/2012 "last drink was at a wedding July, 2014; had a small glass of wine; never had problems w/alcohol"   OB History    No data available     Review of Systems  Constitutional: Negative for fever and chills.  Respiratory: Positive for shortness of breath. Negative for cough.   Cardiovascular: Positive for chest pain and palpitations. Negative for leg swelling.  Gastrointestinal: Negative for nausea, vomiting, abdominal pain and diarrhea.  Musculoskeletal: Negative for back pain, neck pain and neck stiffness.  Skin: Negative for rash and wound.  Neurological: Negative for dizziness, weakness, light-headedness, numbness and headaches.  All other systems reviewed and are negative.     Allergies  Acetaminophen; Codeine; Mirtazapine; and Morphine  Home Medications   Prior to Admission medications   Medication Sig Start Date End Date Taking? Authorizing Provider  albuterol (PROVENTIL,VENTOLIN) 90 MCG/ACT inhaler Inhale 1-2 puffs into the lungs every 6 (six) hours as needed for wheezing or shortness of breath.     Historical Provider, MD  amLODipine (NORVASC)  2.5 MG tablet Take 1 tablet daily on non dialysis days. 05/01/14   Troy Sine, MD  aspirin EC 81 MG tablet Take 81 mg by mouth daily.      Historical Provider, MD  calcium acetate (PHOSLO) 667 MG capsule Take 667-2,001 mg by mouth See admin instructions. Take 3 capsules (2001 mg) with meals and 1 capsule (667 mg) with snacks    Historical Provider, MD  cinacalcet (SENSIPAR) 30 MG tablet Take 30 mg by mouth daily.    Historical Provider, MD  enoxaparin (LOVENOX) 80 MG/0.8ML injection Inject 0.8 mLs (80 mg total) into the skin daily. 05/05/14   Troy Sine, MD  gabapentin (NEURONTIN) 300 MG capsule Take 300 mg by mouth at bedtime.     Historical Provider, MD  insulin aspart (NOVOLOG) 100 UNIT/ML injection Inject 1-3 Units into the skin 3 (three) times daily as needed for high blood sugar (CBG>150). Per sliding scale    Historical Provider, MD  levothyroxine (SYNTHROID, LEVOTHROID) 150 MCG tablet Take 150 mcg by mouth daily before breakfast.    Historical Provider, MD  magnesium oxide (MAG-OX) 400 MG tablet Take 400 mg by mouth daily.    Historical Provider, MD  metoprolol tartrate (LOPRESSOR) 25 MG tablet Take 25 mg by mouth 2 (two) times daily.  03/16/14   Historical Provider, MD  nitroGLYCERIN (NITROSTAT) 0.4 MG SL tablet Place 1 tablet (0.4 mg total) under the tongue every 5 (five) minutes as needed for chest pain. 03/20/14   Silver Huguenin Elgergawy, MD  ondansetron (ZOFRAN-ODT) 4 MG disintegrating tablet Take 4 mg by mouth every 8 (eight) hours as needed for nausea or vomiting.    Historical Provider, MD  tacrolimus (PROGRAF) 1 MG capsule Take 3 mg by mouth 2 (two) times daily.     Historical Provider, MD  traMADol (ULTRAM) 50 MG tablet Take 1 tablet (50 mg total) by mouth every 12 (twelve) hours as needed for moderate pain. 05/20/14   Alvia Grove, PA-C  warfarin (COUMADIN) 2 MG tablet Take 2 tablets by mouth daily or as directed by coumadin clinic 06/01/14   Troy Sine, MD  warfarin (COUMADIN) 4  MG tablet Take 1 tablet (4 mg total) by mouth daily at  6 PM. 05/20/14   Alvia Grove, PA-C   Pulse 92  Resp 23  SpO2 98% Physical Exam  Constitutional: She is oriented to person, place, and time. She appears well-developed and well-nourished. No distress.  HENT:  Head: Normocephalic and atraumatic.  Mouth/Throat: Oropharynx is clear and moist. No oropharyngeal exudate.  Eyes: EOM are normal. Pupils are equal, round, and reactive to light.  Neck: Normal range of motion. Neck supple.  Cardiovascular: Regular rhythm.   No murmur heard. Tachycardia.   Pulmonary/Chest: Effort normal and breath sounds normal. No respiratory distress. She has no wheezes. She has no rales.  Abdominal: Soft. Bowel sounds are normal. She exhibits no distension and no mass. There is no tenderness. There is no rebound and no guarding.  Musculoskeletal: Normal range of motion. She exhibits no edema or tenderness.  No calf swelling or tenderness.  Neurological: She is alert and oriented to person, place, and time.  Moves all extremities without deficit. Sensation is grossly intact.  Skin: Skin is warm and dry. No rash noted. No erythema.  Psychiatric: She has a normal mood and affect. Her behavior is normal.  Nursing note and vitals reviewed.   ED Course  Procedures (including critical care time) Labs Review Labs Reviewed  CBC WITH DIFFERENTIAL/PLATELET  COMPREHENSIVE METABOLIC PANEL  BRAIN NATRIURETIC PEPTIDE  TROPONIN I  URINALYSIS, ROUTINE W REFLEX MICROSCOPIC  URINE RAPID DRUG SCREEN (HOSP PERFORMED)  PROTIME-INR  APTT    Imaging Review No results found.   EKG Interpretation None     ED ECG REPORT   Date: 06/09/2014 0436  Rate: 157  Rhythm: supraventricular tachycardia (SVT)  QRS Axis: normal  Intervals: normal  ST/T Wave abnormalities: nonspecific T wave changes  Conduction Disutrbances:none  Narrative Interpretation:   Old EKG Reviewed: changes noted  I have personally reviewed  the EKG tracing and agree with the computerized printout as noted.   ED ECG REPORT   Date: 06/09/2014 0454  Rate: 87  Rhythm: normal sinus rhythm  QRS Axis: normal  Intervals: QT prolonged  ST/T Wave abnormalities: normal  Conduction Disutrbances:none  Narrative Interpretation:   Old EKG Reviewed: changes noted  I have personally reviewed the EKG tracing and agree with the computerized printout as noted.  MDM   Final diagnoses:  Chest pain    Patient given 6 mg of adenosine with conversion to normal sinus rhythm. Patient states that her shortness of breath and chest pain have improved.  Patient is completely asymptomatic at this point. Vital signs are stable. Discuss with cardiology on-call. Advised repeat a delta troponin and if no significant change can be discharged home to follow-up with Dr. Harrington Challenger.  Signed out to oncoming EDP pending repeat trop  Julianne Rice, MD 06/11/14 2322

## 2014-06-09 NOTE — ED Notes (Addendum)
Per EMS, called out due to weakness and sob and heart racing since 0200, o2 99% on RA, pt began complaining of chest pain when pulling into the parking lot, no iv access due to difficulty access. 138/76, 214CBG, RR 18, HR 160bpm regular, vageled down to 140 but back up to 160, pt in room with Dr Lita Mains at bedside. Pt has hx of artificial valve.

## 2014-06-09 NOTE — ED Notes (Signed)
Dr Lita Mains ordered 6mg  of adenosine, zoll at the bedside, pads on patient, Dr Lita Mains this RN, Janett Billow RN, Gretta Cool RN at the bedside with patient. HR 160 SVT, Adenosine pushed, pt converted to NSR 90bpm. Pt states "I feel a little better and less sob"

## 2014-06-09 NOTE — ED Provider Notes (Signed)
09: 50- patient evaluated after return. A second troponin. It is negative. Patient is not tachycardic, is comfortable and has no further complaints. She states that she's been evaluated for this in the past, by Dr. Shelva Majestic, cardiologist. He did a Holter monitor, for 1 month, but there were no findings. She continues to be anticoagulated, with warfarin. She reports having several intermittent episodes in the past. She denies other problems, currently.    EKG Interpretation  Date/Time:  Friday June 09 2014 04:36:39 EDT Ventricular Rate:  157 PR Interval:  204 QRS Duration: 91 QT Interval:  337 QTC Calculation: 545 R Axis:   41 Text Interpretation:  Sinus tachycardia Prolonged PR interval Prominent P waves, nondiagnostic Consider left ventricular hypertrophy Anterior Q waves, possibly due to LVH Prolonged QT interval Since last tracing rate faster (RBBB and left anterior fascicular block) QT has lengthened Confirmed by Eulis Foster  MD, Fredrica Capano [phone]) on 06/09/2014 9:59:35 AM       EKG Interpretation  Date/Time:  Friday June 09 2014 04:54:15 EDT Ventricular Rate:  87 PR Interval:  194 QRS Duration: 89 QT Interval:  437 QTC Calculation: 526 R Axis:   35 Text Interpretation:  Sinus rhythm Anteroseptal infarct, old Nonspecific T abnormalities, lateral leads Prolonged QT interval Since last tracing of earlier today SVT resolved Confirmed by [facility]  MD, Ed Rayson [phone]) on 06/09/2014 10:01:06 AM         Results for orders placed or performed during the hospital encounter of 06/09/14  CBC with Differential/Platelet  Result Value Ref Range   WBC 5.7 4.0 - 10.5 K/uL   RBC 3.77 (L) 3.87 - 5.11 MIL/uL   Hemoglobin 11.8 (L) 12.0 - 15.0 g/dL   HCT 36.4 36.0 - 46.0 %   MCV 96.6 78.0 - 100.0 fL   MCH 31.3 26.0 - 34.0 pg   MCHC 32.4 30.0 - 36.0 g/dL   RDW 15.8 (H) 11.5 - 15.5 %   Platelets  150 - 400 K/uL    PLATELET CLUMPS NOTED ON SMEAR, COUNT APPEARS DECREASED   Neutrophils Relative % 61  43 - 77 %   Lymphocytes Relative 25 12 - 46 %   Monocytes Relative 11 3 - 12 %   Eosinophils Relative 2 0 - 5 %   Basophils Relative 1 0 - 1 %   Neutro Abs 3.5 1.7 - 7.7 K/uL   Lymphs Abs 1.4 0.7 - 4.0 K/uL   Monocytes Absolute 0.6 0.1 - 1.0 K/uL   Eosinophils Absolute 0.1 0.0 - 0.7 K/uL   Basophils Absolute 0.1 0.0 - 0.1 K/uL  Comprehensive metabolic panel  Result Value Ref Range   Sodium 133 (L) 135 - 145 mmol/L   Potassium 3.6 3.5 - 5.1 mmol/L   Chloride 92 (L) 96 - 112 mmol/L   CO2 24 19 - 32 mmol/L   Glucose, Bld 228 (H) 70 - 99 mg/dL   BUN 26 (H) 6 - 23 mg/dL   Creatinine, Ser 6.48 (H) 0.50 - 1.10 mg/dL   Calcium 8.4 8.4 - 10.5 mg/dL   Total Protein 7.6 6.0 - 8.3 g/dL   Albumin 3.1 (L) 3.5 - 5.2 g/dL   AST 46 (H) 0 - 37 U/L   ALT 22 0 - 35 U/L   Alkaline Phosphatase 98 39 - 117 U/L   Total Bilirubin 0.6 0.3 - 1.2 mg/dL   GFR calc non Af Amer 6 (L) >90 mL/min   GFR calc Af Amer 7 (L) >90 mL/min   Anion  gap 17 (H) 5 - 15  Brain natriuretic peptide  Result Value Ref Range   B Natriuretic Peptide 351.9 (H) 0.0 - 100.0 pg/mL  Troponin I  Result Value Ref Range   Troponin I 0.05 (H) <0.031 ng/mL  Protime-INR  Result Value Ref Range   Prothrombin Time 15.9 (H) 11.6 - 15.2 seconds   INR 1.25 0.00 - 1.49  APTT  Result Value Ref Range   aPTT 28 24 - 37 seconds  Troponin I  Result Value Ref Range   Troponin I 0.05 (H) <0.031 ng/mL     Assessment; paroxysmal SVT, improved and stabilized, in the emergency department after a single dose of adenosine. Doubt ACS, metabolic instability, serious bacterial infection or impending vascular collapse.  Nursing Notes Reviewed/ Care Coordinated Applicable Imaging Reviewed Interpretation of Laboratory Data incorporated into ED treatment  The patient appears reasonably screened and/or stabilized for discharge and I doubt any other medical condition or other [facility] requiring further screening, evaluation, or treatment in the ED at this  time prior to discharge.  Plan: Home Medications- usual; Home Treatments- rest; return here if the recommended treatment, does not improve the symptoms; Recommended follow up- Cardiology F/U in 1 week   Daleen Bo, MD 06/09/14 1002

## 2014-06-10 DIAGNOSIS — D631 Anemia in chronic kidney disease: Secondary | ICD-10-CM | POA: Diagnosis not present

## 2014-06-10 DIAGNOSIS — N2581 Secondary hyperparathyroidism of renal origin: Secondary | ICD-10-CM | POA: Diagnosis not present

## 2014-06-10 DIAGNOSIS — N186 End stage renal disease: Secondary | ICD-10-CM | POA: Diagnosis not present

## 2014-06-10 DIAGNOSIS — T8640 Unspecified complication of liver transplant: Secondary | ICD-10-CM | POA: Diagnosis not present

## 2014-06-10 DIAGNOSIS — E118 Type 2 diabetes mellitus with unspecified complications: Secondary | ICD-10-CM | POA: Diagnosis not present

## 2014-06-10 DIAGNOSIS — Z992 Dependence on renal dialysis: Secondary | ICD-10-CM | POA: Diagnosis not present

## 2014-06-13 DIAGNOSIS — E118 Type 2 diabetes mellitus with unspecified complications: Secondary | ICD-10-CM | POA: Diagnosis not present

## 2014-06-13 DIAGNOSIS — N186 End stage renal disease: Secondary | ICD-10-CM | POA: Diagnosis not present

## 2014-06-13 DIAGNOSIS — D631 Anemia in chronic kidney disease: Secondary | ICD-10-CM | POA: Diagnosis not present

## 2014-06-13 DIAGNOSIS — N2581 Secondary hyperparathyroidism of renal origin: Secondary | ICD-10-CM | POA: Diagnosis not present

## 2014-06-13 DIAGNOSIS — D509 Iron deficiency anemia, unspecified: Secondary | ICD-10-CM | POA: Diagnosis not present

## 2014-06-15 ENCOUNTER — Encounter: Payer: Self-pay | Admitting: Vascular Surgery

## 2014-06-15 DIAGNOSIS — I359 Nonrheumatic aortic valve disorder, unspecified: Secondary | ICD-10-CM | POA: Diagnosis not present

## 2014-06-15 LAB — PROTIME-INR: INR: 1.9 — AB (ref 0.9–1.1)

## 2014-06-16 ENCOUNTER — Encounter: Payer: Medicare Other | Admitting: Vascular Surgery

## 2014-06-17 ENCOUNTER — Emergency Department (HOSPITAL_COMMUNITY)
Admission: EM | Admit: 2014-06-17 | Discharge: 2014-06-17 | Disposition: A | Discharge status: Home / Self Care | Payer: Medicare Other | Attending: Emergency Medicine | Admitting: Emergency Medicine

## 2014-06-17 ENCOUNTER — Encounter (HOSPITAL_COMMUNITY): Payer: Self-pay | Admitting: Emergency Medicine

## 2014-06-17 DIAGNOSIS — Z992 Dependence on renal dialysis: Secondary | ICD-10-CM | POA: Diagnosis not present

## 2014-06-17 DIAGNOSIS — Z8701 Personal history of pneumonia (recurrent): Secondary | ICD-10-CM | POA: Insufficient documentation

## 2014-06-17 DIAGNOSIS — E669 Obesity, unspecified: Secondary | ICD-10-CM | POA: Insufficient documentation

## 2014-06-17 DIAGNOSIS — K5900 Constipation, unspecified: Secondary | ICD-10-CM | POA: Diagnosis not present

## 2014-06-17 DIAGNOSIS — G8929 Other chronic pain: Secondary | ICD-10-CM | POA: Insufficient documentation

## 2014-06-17 DIAGNOSIS — I471 Supraventricular tachycardia: Secondary | ICD-10-CM | POA: Insufficient documentation

## 2014-06-17 DIAGNOSIS — E039 Hypothyroidism, unspecified: Secondary | ICD-10-CM | POA: Insufficient documentation

## 2014-06-17 DIAGNOSIS — N186 End stage renal disease: Secondary | ICD-10-CM | POA: Insufficient documentation

## 2014-06-17 DIAGNOSIS — I499 Cardiac arrhythmia, unspecified: Secondary | ICD-10-CM | POA: Diagnosis not present

## 2014-06-17 DIAGNOSIS — Z7901 Long term (current) use of anticoagulants: Secondary | ICD-10-CM | POA: Insufficient documentation

## 2014-06-17 DIAGNOSIS — D649 Anemia, unspecified: Secondary | ICD-10-CM | POA: Diagnosis not present

## 2014-06-17 DIAGNOSIS — J449 Chronic obstructive pulmonary disease, unspecified: Secondary | ICD-10-CM | POA: Diagnosis not present

## 2014-06-17 DIAGNOSIS — I120 Hypertensive chronic kidney disease with stage 5 chronic kidney disease or end stage renal disease: Secondary | ICD-10-CM | POA: Diagnosis not present

## 2014-06-17 DIAGNOSIS — K219 Gastro-esophageal reflux disease without esophagitis: Secondary | ICD-10-CM | POA: Insufficient documentation

## 2014-06-17 DIAGNOSIS — Z720 Tobacco use: Secondary | ICD-10-CM | POA: Diagnosis not present

## 2014-06-17 DIAGNOSIS — Z955 Presence of coronary angioplasty implant and graft: Secondary | ICD-10-CM | POA: Insufficient documentation

## 2014-06-17 DIAGNOSIS — Z944 Liver transplant status: Secondary | ICD-10-CM | POA: Diagnosis not present

## 2014-06-17 DIAGNOSIS — Z954 Presence of other heart-valve replacement: Secondary | ICD-10-CM | POA: Insufficient documentation

## 2014-06-17 DIAGNOSIS — I2510 Atherosclerotic heart disease of native coronary artery without angina pectoris: Secondary | ICD-10-CM | POA: Diagnosis not present

## 2014-06-17 DIAGNOSIS — Z8619 Personal history of other infectious and parasitic diseases: Secondary | ICD-10-CM | POA: Diagnosis not present

## 2014-06-17 DIAGNOSIS — Z9889 Other specified postprocedural states: Secondary | ICD-10-CM | POA: Diagnosis not present

## 2014-06-17 DIAGNOSIS — F419 Anxiety disorder, unspecified: Secondary | ICD-10-CM | POA: Insufficient documentation

## 2014-06-17 DIAGNOSIS — Z7982 Long term (current) use of aspirin: Secondary | ICD-10-CM | POA: Insufficient documentation

## 2014-06-17 DIAGNOSIS — E119 Type 2 diabetes mellitus without complications: Secondary | ICD-10-CM | POA: Diagnosis not present

## 2014-06-17 DIAGNOSIS — Z794 Long term (current) use of insulin: Secondary | ICD-10-CM | POA: Insufficient documentation

## 2014-06-17 DIAGNOSIS — G709 Myoneural disorder, unspecified: Secondary | ICD-10-CM | POA: Diagnosis not present

## 2014-06-17 DIAGNOSIS — F329 Major depressive disorder, single episode, unspecified: Secondary | ICD-10-CM | POA: Insufficient documentation

## 2014-06-17 DIAGNOSIS — M1990 Unspecified osteoarthritis, unspecified site: Secondary | ICD-10-CM | POA: Diagnosis not present

## 2014-06-17 DIAGNOSIS — Z79899 Other long term (current) drug therapy: Secondary | ICD-10-CM | POA: Insufficient documentation

## 2014-06-17 DIAGNOSIS — G43909 Migraine, unspecified, not intractable, without status migrainosus: Secondary | ICD-10-CM | POA: Insufficient documentation

## 2014-06-17 LAB — COMPREHENSIVE METABOLIC PANEL
ALT: 19 U/L (ref 14–54)
ANION GAP: 15 (ref 5–15)
AST: 33 U/L (ref 15–41)
Albumin: 2.9 g/dL — ABNORMAL LOW (ref 3.5–5.0)
Alkaline Phosphatase: 94 U/L (ref 38–126)
BILIRUBIN TOTAL: 0.8 mg/dL (ref 0.3–1.2)
BUN: 13 mg/dL (ref 6–20)
CHLORIDE: 95 mmol/L — AB (ref 101–111)
CO2: 27 mmol/L (ref 22–32)
CREATININE: 4.05 mg/dL — AB (ref 0.44–1.00)
Calcium: 8.3 mg/dL — ABNORMAL LOW (ref 8.9–10.3)
GFR calc Af Amer: 13 mL/min — ABNORMAL LOW (ref >60)
GFR calc non Af Amer: 11 mL/min — ABNORMAL LOW (ref >60)
Glucose, Bld: 196 mg/dL — ABNORMAL HIGH (ref 70–99)
Potassium: 3.1 mmol/L — ABNORMAL LOW (ref 3.5–5.1)
Sodium: 137 mmol/L (ref 135–145)
Total Protein: 7.3 g/dL (ref 6.5–8.1)

## 2014-06-17 LAB — CBC WITH DIFFERENTIAL/PLATELET
Basophils Absolute: 0 10*3/uL (ref 0.0–0.1)
Basophils Relative: 0 % (ref 0–1)
EOS PCT: 3 % (ref 0–5)
Eosinophils Absolute: 0.1 10*3/uL (ref 0.0–0.7)
HCT: 36.7 % (ref 36.0–46.0)
HEMOGLOBIN: 11.9 g/dL — AB (ref 12.0–15.0)
LYMPHS ABS: 0.8 10*3/uL (ref 0.7–4.0)
Lymphocytes Relative: 18 % (ref 12–46)
MCH: 31.2 pg (ref 26.0–34.0)
MCHC: 32.4 g/dL (ref 30.0–36.0)
MCV: 96.1 fL (ref 78.0–100.0)
MONOS PCT: 13 % — AB (ref 3–12)
Monocytes Absolute: 0.6 10*3/uL (ref 0.1–1.0)
NEUTROS ABS: 3 10*3/uL (ref 1.7–7.7)
Neutrophils Relative %: 66 % (ref 43–77)
Platelets: 83 10*3/uL — ABNORMAL LOW (ref 150–400)
RBC: 3.82 MIL/uL — AB (ref 3.87–5.11)
RDW: 16.8 % — ABNORMAL HIGH (ref 11.5–15.5)
WBC: 4.5 10*3/uL (ref 4.0–10.5)

## 2014-06-17 LAB — I-STAT CG4 LACTIC ACID, ED: LACTIC ACID, VENOUS: 1.49 mmol/L (ref 0.5–2.0)

## 2014-06-17 LAB — PROTIME-INR
INR: 2.14 — ABNORMAL HIGH (ref 0.00–1.49)
Prothrombin Time: 24.1 seconds — ABNORMAL HIGH (ref 11.6–15.2)

## 2014-06-17 LAB — BRAIN NATRIURETIC PEPTIDE: B Natriuretic Peptide: 1109.3 pg/mL — ABNORMAL HIGH (ref 0.0–100.0)

## 2014-06-17 NOTE — ED Provider Notes (Signed)
CSN: [id number]     Arrival date & time 06/17/14  1045 History   First MD Initiated Contact with Patient 06/17/14 1041     Chief Complaint  Patient presents with  . Irregular Heart Beat     (Consider location/radiation/quality/duration/timing/severity/associated sxs/prior Treatment) HPI Patient presents with concern of chest pain, palpitations. Patient was at dialysis, with approximately 30 minutes left in her session when she developed the complaints. The pain is diffuse, anterior, squeezing. There was associated dyspnea, palpitations. Symptoms resolved while in route via EMS Current the patient has no complaints. She has a recent history of SVT, and is scheduled to see cardiology in 2 days. She states that she is generally well other than dialysis, SVT.  Past Medical History  Diagnosis Date  . 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 Chronic back pain   . CAD (coronary artery disease)   . Obesity   . Peripheral vascular disease hands and legs  . Anxiety   . Asthma   . GERD (gastroesophageal reflux disease)     takes Omeprazole daily  . Chronic constipation     takes MIralax and Colace daily  . Anemia     takes Folic Acid daily  . Hypothyroidism     takes Synthroid daily  . Depression     takes Cymbalta for "severe" depression  . Neuromuscular disorder     carpal tunnel in right hand  . Hypertension     takes Metoprolol and Lisinopril daily, sees Dr Bea Graff  . COPD (chronic obstructive pulmonary disease)   . Pneumonia     "today and several times before" (08/30/2012)  . Chronic bronchitis     "q yr w/season changes" (08/30/2012)  . Type II diabetes mellitus     Levemir 2units daily if > 150  . History of blood transfusion     "several" (08/30/2012)  . Hepatitis C   . Migraine     "last migraine was in 2013" (08/30/2012)  . Headache     "at least monthly" (08/30/2012)  . Arthritis     "left  hand, back" (08/30/2012)  . End stage renal disease on dialysis 02/27/2011    Kidneys shut down at time of liver transplant in Sept 2011 at [facility] in [place], she has been on HD ever since.  Dialyzes at [facility] HD on TTS schedule.  Had L forearm graft used 10 months then removed Dec 2012 due to suspected infection.  A right upper arm AVG was placed Dec 2012 but she developed steal symptoms acutely and it was ligated the same day.  Never had an AV fistula due to small veins.  Now has L thigh AVG put in Jan 2013, has not clotted to date.    [name] CAD (coronary artery disease) Jan. 2015    Cath: 20% LAD, 50% D1; s/p LIMA-LAD  . Aortic stenosis   . Tobacco abuse   . S/P aortic valve replacement 03/15/13    Mechanical   . SVT (supraventricular tachycardia) 06/09/14   Past Surgical History  Procedure Laterality Date  . Liver transplant  10/25/2009    sees Dr Ferol Luz 1 every 6 months, saw last in Dec 2013. [name] Coord [phone]  . Small intestine surgery  90's  . Thrombectomy    . Arteriovenous graft placement Left 10/03/10     forearm  . Avgg removal  12/23/2010  Procedure: REMOVAL OF ARTERIOVENOUS GORETEX GRAFT ([place]);  Surgeon: Elam Dutch, MD;  Location: [place];  Service: Vascular;  Laterality: Left;  procedure started @1736 -1852  . Insertion of dialysis catheter  12/23/2010    Procedure: INSERTION OF DIALYSIS CATHETER;  Surgeon: Elam Dutch, MD;  Location: [place];  Service: Vascular;  Laterality: Right;  Right Internal Jugular 28cm dialysis catheter insertion procedure time 1701-1720   . Cholecystectomy  1993  . Cystoscopy  1990's  . Spinal growth rods  2010    "put 2 metal rods in my back; they had detetriorated" (08/30/2012)  . Av fistula placement  01/29/2011    Procedure: INSERTION OF ARTERIOVENOUS (AV) GORE-TEX GRAFT ARM;  Surgeon: Elam Dutch, MD;  Location: [place];  Service: Vascular;  Laterality: Right;  . Av fistula placement  03/10/2011    Procedure: INSERTION  OF ARTERIOVENOUS (AV) GORE-TEX GRAFT THIGH;  Surgeon: Elam Dutch, MD;  Location: [place];  Service: Vascular;  Laterality: Left;  . Tubal ligation  1990's  . Aortic valve replacement N/A 03/15/2013    AVR; Surgeon: Ivin Poot, MD;  Location: [facility] OR; Open Heart Surgery;  60mm[employer] mechanical prosthesis, top hat valve  . Coronary artery bypass graft N/A 03/15/2013    Procedure: CORONARY ARTERY BYPASS GRAFTING (CABG) times one using left internal mammary artery.;  Surgeon: Ivin Poot, MD;  Location: [place];  Service: Open Heart Surgery;  Laterality: N/A;  POSS CABG X 1  . Intraoperative transesophageal echocardiogram N/A 03/15/2013    Procedure: INTRAOPERATIVE TRANSESOPHAGEAL ECHOCARDIOGRAM;  Surgeon: Ivin Poot, MD;  Location: [place];  Service: Open Heart Surgery;  Laterality: N/A;  . Left heart catheterization with coronary/graft angiogram N/A 12/24/2011    Procedure: LEFT HEART CATHETERIZATION WITH Beatrix Fetters;  Surgeon: [name], MD;  Location[facility] CATH LAB;  Service: Cardiovascular;  Laterality: N/A;  . Left heart catheterization with coronary angiogram N/A 07/29/2012    Procedure: LEFT HEART CATHETERIZATION WITH CORONARY ANGIOGRAM;  Surgeon: Troy Sine, MD;  Location: [facility] CATH LAB;  Service: Cardiovascular;  Laterality: N/A;  . Left heart catheterization with coronary angiogram N/A 03/10/2013    Procedure: LEFT HEART CATHETERIZATION WITH CORONARY ANGIOGRAM;  Surgeon: Troy Sine, MD;  Location: [facility] CATH LAB;  Service: Cardiovascular;  Laterality: N/A;  . Left heart catheterization with coronary/graft angiogram N/A 12/16/2013    Procedure: LEFT HEART CATHETERIZATION WITH Beatrix Fetters;  Surgeon: Troy Sine, MD;  Location: [facility] CATH LAB;  Service: Cardiovascular;  Laterality: N/A;  . Thrombectomy and revision of arterioventous (av) goretex  graft Left 03/30/2014  . Thrombectomy and revision of arterioventous (av) goretex  graft Left 03/30/2014     Procedure: THROMBECTOMY AND REVISION OF ARTERIOVENTOUS (AV) GORETEX  GRAFT;  Surgeon: Conrad Hampstead, MD;  Location: [place];  Service: Vascular;  Laterality: Left;  . Shuntogram Left 05/15/2014    Procedure: SHUNTOGRAM;  Surgeon: Conrad Why, MD;  Location: [facility] CATH LAB;  Service: Cardiovascular;  Laterality: Left;   Family History  Problem Relation Age of Onset  . Cancer Mother   . Diabetes Mother   . Hypertension Mother   . Stroke Mother   . Cancer Father   . Anesthesia problems Neg Hx   . Hypotension Neg Hx   . Malignant hyperthermia Neg Hx   . Pseudochol deficiency Neg Hx    History  Substance Use Topics  . Smoking status: Current Some Day Smoker -- 0.75 packs/day for 40 years  Types: Cigarettes    Last Attempt to Quit: 03/13/2013  . Smokeless tobacco: Never Used     Comment: still smokng 1 or 2 a day  . Alcohol Use: Yes     Comment: 08/30/2012 "last drink was at a wedding July, 2014; had a small glass of wine; never had problems w/alcohol"   OB History    No data available     Review of Systems  Constitutional:       Per HPI, otherwise negative  HENT:       Per HPI, otherwise negative  Respiratory:       Per HPI, otherwise negative  Cardiovascular:       Per HPI, otherwise negative  Gastrointestinal: Negative for vomiting.  Endocrine:       Negative aside from HPI  Genitourinary:       Neg aside from HPI   Musculoskeletal:       Per HPI, otherwise negative  Skin: Negative.   Neurological: Negative for syncope.      Allergies  Acetaminophen; Codeine; Mirtazapine; and Morphine  Home Medications   Prior to Admission medications   Medication Sig Start Date End Date Taking? Authorizing Provider  albuterol (PROVENTIL,VENTOLIN) 90 MCG/ACT inhaler Inhale 1-2 puffs into the lungs every 6 (six) hours as needed for wheezing or shortness of breath.    Yes Historical Provider, MD  amLODipine (NORVASC) 2.5 MG tablet Take 1 tablet daily on non dialysis days. Patient  taking differently: Take 1 tablet daily on non dialysis days. So everyday except tue,thurs,sat 05/01/14  Yes Troy Sine, MD  aspirin EC 81 MG tablet Take 81 mg by mouth daily. Was given 4 tablets tonight   Yes Historical Provider, MD  calcium acetate (PHOSLO) 667 MG capsule Take 667-2,001 mg by mouth See admin instructions. Take 3 capsules (2001 mg) with meals and 1 capsule (667 mg) with snacks   Yes Historical Provider, MD  cinacalcet (SENSIPAR) 30 MG tablet Take 30 mg by mouth daily.   Yes Historical Provider, MD  gabapentin (NEURONTIN) 300 MG capsule Take 300 mg by mouth at bedtime.    Yes Historical Provider, MD  insulin aspart (NOVOLOG) 100 UNIT/ML injection Inject 1-3 Units into the skin 3 (three) times daily as needed for high blood sugar (CBG>150). Per sliding scale   Yes Historical Provider, MD  levothyroxine (SYNTHROID, LEVOTHROID) 150 MCG tablet Take 150 mcg by mouth daily before breakfast.   Yes Historical Provider, MD  magnesium oxide (MAG-OX) 400 MG tablet Take 400 mg by mouth daily.   Yes Historical Provider, MD  metoprolol tartrate (LOPRESSOR) 25 MG tablet Take 25 mg by mouth 2 (two) times daily.  03/16/14  Yes Historical Provider, MD  nitroGLYCERIN (NITROSTAT) 0.4 MG SL tablet Place 1 tablet (0.4 mg total) under the tongue every 5 (five) minutes as needed for chest pain. 03/20/14  Yes Silver Huguenin Elgergawy, MD  ondansetron (ZOFRAN-ODT) 4 MG disintegrating tablet Take 4 mg by mouth every 8 (eight) hours as needed for nausea or vomiting.   Yes Historical Provider, MD  tacrolimus (PROGRAF) 1 MG capsule Take 3 mg by mouth 2 (two) times daily.    Yes Historical Provider, MD  traMADol (ULTRAM) 50 MG tablet Take 1 tablet (50 mg total) by mouth every 12 (twelve) hours as needed for moderate pain. 05/20/14  Yes Alvia Grove, PA-C  warfarin (COUMADIN) 4 MG tablet Take 1 tablet (4 mg total) by mouth daily at 6 PM. Patient taking differently: Take 2-4  mg by mouth daily at 6 PM. 4mg  every day except  Friday. Only 2mg  on Fridays. 05/20/14  Yes Kimberly A Trinh, PA-C  enoxaparin (LOVENOX) 80 MG/0.8ML injection Inject 0.8 mLs (80 mg total) into the skin daily. Patient not taking: Reported on 06/17/2014 05/05/14   Troy Sine, MD  warfarin (COUMADIN) 2 MG tablet Take 2 tablets by mouth daily or as directed by coumadin clinic Patient not taking: Reported on 06/17/2014 06/01/14   Troy Sine, MD   BP 150/55 mmHg  Pulse 75  Temp(Src) 98 F (36.7 C) (Oral)  Resp 16  Wt   SpO2 100% Physical Exam  Constitutional: She is oriented to person, place, and time. She appears well-developed and well-nourished. No distress.  HENT:  Head: Normocephalic and atraumatic.  Eyes: Conjunctivae and EOM are normal.  Cardiovascular: Normal rate and regular rhythm.   Pulmonary/Chest: Effort normal. No stridor. No respiratory distress.  Abdominal: She exhibits no distension.  Musculoskeletal: She exhibits no edema.  Neurological: She is alert and oriented to person, place, and time. No cranial nerve deficit.  Skin: Skin is warm and dry.  Psychiatric: She has a normal mood and affect.  Nursing note and vitals reviewed.   ED Course  Procedures (including critical care time) Labs Review Labs Reviewed  COMPREHENSIVE METABOLIC PANEL - Abnormal; Notable for the following:    Potassium 3.1 (*)    Chloride 95 (*)    Glucose, Bld 196 (*)    Creatinine, Ser 4.05 (*)    Calcium 8.3 (*)    Albumin 2.9 (*)    GFR calc non Af Amer 11 (*)    GFR calc Af Amer 13 (*)    All other components within normal limits  BRAIN NATRIURETIC PEPTIDE - Abnormal; Notable for the following:    B Natriuretic Peptide 1109.3 (*)    All other components within normal limits  CBC WITH DIFFERENTIAL/PLATELET - Abnormal; Notable for the following:    RBC 3.82 (*)    Hemoglobin 11.9 (*)    RDW 16.8 (*)    Platelets 83 (*)    Monocytes Relative 13 (*)    All other components within normal limits  PROTIME-INR - Abnormal; Notable for  the following:    Prothrombin Time 24.1 (*)    INR 2.14 (*)    All other components within normal limits  I-STAT CG4 LACTIC ACID, ED      EKG Interpretation   Date/Time:  Saturday Jun 17 2014 10:57:47 EDT Ventricular Rate:  81 PR Interval:  179 QRS Duration: 95 QT Interval:  467 QTC Calculation: 542 R Axis:   -10 Text Interpretation:  Sinus rhythm Probable left atrial enlargement LVH  with secondary repolarization abnormality Anterior Q waves, possibly due  to LVH Prolonged QT interval Sinus rhythm QT prolonged Artifact Abnormal  ekg Confirmed by Carmin Muskrat  MD ([id number]) on 06/17/2014 11:03:17 AM     Cardiac monitor 90 sinus normal Pulse ox 100% room air normal  EMS rhythm strip shows the patient was in SVT, heart rate 150s, soon after their arrival to the dialysis center.  Chart review demonstrates the patient has had multiple visits for similar concerns.  2:50 PM For several hours patient has had no additional SVT episodes.  MDM   Final diagnoses:  SVT (supraventricular tachycardia)   patient presents after developing chest pain, palpitations while at dialysis. Patient's symptoms resolved will be transported here, and here she is hemodynamically stable, with no recurrence of SVT. Patient  does have mild abnormalities, including elevated BNP, mild hypokalemia, but no evidence for new acute pathology. After being monitored for several hours, the patient was discharged in stable condition to follow-up with cardiology.  Carmin Muskrat, MD 06/17/14 1452

## 2014-06-17 NOTE — ED Notes (Signed)
Pt transported from dialysis center in [place]== with c/o onset SVT-- heart rate was 150 on EMS arrival , enroute here-- EMS hit "a bump" and heart rate dropped to70's NSR. No chest pain at present.

## 2014-06-17 NOTE — Discharge Instructions (Signed)
As discussed, your evaluation today has been largely reassuring.  But, it is important that you monitor your condition carefully, and do not hesitate to return to the ED if you develop new, or concerning changes in your condition. ? ?Otherwise, please follow-up with your physician for appropriate ongoing care. ? ?

## 2014-06-17 NOTE — ED Notes (Signed)
Daughter will pick pt up, coming from [place]--states will be 30 minutes.

## 2014-06-17 NOTE — ED Notes (Signed)
Pressure held for 20 min to graft site

## 2014-06-19 ENCOUNTER — Ambulatory Visit (INDEPENDENT_AMBULATORY_CARE_PROVIDER_SITE_OTHER): Payer: Medicare Other | Admitting: Cardiovascular Disease

## 2014-06-19 ENCOUNTER — Encounter: Payer: Self-pay | Admitting: Cardiovascular Disease

## 2014-06-19 VITALS — BP 152/72 | HR 77 | Ht 64.0 in | Wt 183.8 lb

## 2014-06-19 DIAGNOSIS — I25708 Atherosclerosis of coronary artery bypass graft(s), unspecified, with other forms of angina pectoris: Secondary | ICD-10-CM

## 2014-06-19 DIAGNOSIS — I471 Supraventricular tachycardia: Secondary | ICD-10-CM

## 2014-06-19 DIAGNOSIS — Z7901 Long term (current) use of anticoagulants: Secondary | ICD-10-CM

## 2014-06-19 DIAGNOSIS — R079 Chest pain, unspecified: Secondary | ICD-10-CM | POA: Diagnosis not present

## 2014-06-19 DIAGNOSIS — G4730 Sleep apnea, unspecified: Secondary | ICD-10-CM | POA: Diagnosis not present

## 2014-06-19 DIAGNOSIS — N186 End stage renal disease: Secondary | ICD-10-CM | POA: Diagnosis not present

## 2014-06-19 DIAGNOSIS — Z992 Dependence on renal dialysis: Secondary | ICD-10-CM

## 2014-06-19 MED ORDER — VERAPAMIL HCL 120 MG PO TABS: 120.0000 mg | ORAL_TABLET | Freq: Every day | ORAL | Status: DC

## 2014-06-19 NOTE — Patient Instructions (Signed)
Your physician has recommended you make the following change in your medication:  Stop amlodipine. Start new prescription for verapramil. This has already been sent to your pharmacy.  Your physician recommends that you schedule a follow-up appointment in: 3 months.

## 2014-06-20 ENCOUNTER — Ambulatory Visit (INDEPENDENT_AMBULATORY_CARE_PROVIDER_SITE_OTHER): Payer: Medicare Other | Admitting: Pharmacist Clinician (PhC)/ Clinical Pharmacy Specialist

## 2014-06-20 DIAGNOSIS — Z954 Presence of other heart-valve replacement: Secondary | ICD-10-CM

## 2014-06-20 DIAGNOSIS — Z7901 Long term (current) use of anticoagulants: Secondary | ICD-10-CM

## 2014-06-20 DIAGNOSIS — Z952 Presence of prosthetic heart valve: Secondary | ICD-10-CM

## 2014-06-20 LAB — PROTIME-INR

## 2014-06-21 DIAGNOSIS — N186 End stage renal disease: Secondary | ICD-10-CM | POA: Diagnosis not present

## 2014-06-21 DIAGNOSIS — N281 Cyst of kidney, acquired: Secondary | ICD-10-CM | POA: Diagnosis not present

## 2014-06-21 DIAGNOSIS — N261 Atrophy of kidney (terminal): Secondary | ICD-10-CM | POA: Diagnosis not present

## 2014-06-21 DIAGNOSIS — Z01818 Encounter for other preprocedural examination: Secondary | ICD-10-CM | POA: Diagnosis not present

## 2014-06-22 ENCOUNTER — Encounter: Payer: Self-pay | Admitting: Cardiovascular Disease

## 2014-06-22 DIAGNOSIS — I471 Supraventricular tachycardia: Secondary | ICD-10-CM | POA: Insufficient documentation

## 2014-06-22 DIAGNOSIS — I359 Nonrheumatic aortic valve disorder, unspecified: Secondary | ICD-10-CM | POA: Diagnosis not present

## 2014-06-22 LAB — PROTIME-INR: INR: 3.5 — AB (ref <=1.1)

## 2014-06-22 NOTE — Progress Notes (Signed)
Patient ID: [name], female   DOB: May 30, 1955, 59 y.o.   MRN: [id number]    HPI: [name] is a 59 y.o. African American  female who presents to the office today for a 2 month follow up cardiology evaluation.  [name] has end-stage renal disease and is on chronic hemodialysis.  After undergoing liver transplantation at [facility] for hepatitis C  She was admitted to [facility] in January 2015 with increasing chest pain.  A dobutamine echo study suggested severe AS with an ejection fraction of 45%.  Cardiac catheterization revealed mild CAD with 50% narrowing in moderately severe aortic stenosis with aortic valve area in the range of 0.8-1.2.  She ultimately underwent aortic valve replacement with mechanical aortic valve and single vessel LIMA to LAD CABG revascularization surgery on 03/15/2013 by [name]. [name].  Following discharge, she presented to the hospital for further evaluation of chest pain and palpitations.  Troponins were negative.  Beta blocker therapy was increased to following discharge.   She undergoes dialysis on Tuesday, Thursdays, and Saturdays.  She was again hospitalized from November 5 through 12/21/2013 and presented with chest pain episodes which occurred during dialysis.  Her ECG showed new T-wave inversion in inferior leads.  She underwent a repeat cardiac catheterization on November 6 by me which showed a focal 70% mid LAD stenosis after the takeoff of the second diagonal vessel, but the LIMA graft to the mid LAD was patent.  The left circumflex vessel had 20% smooth narrowing in the second marginal branch.  The large dominant RCA was normal.  Medical therapy was recommended.  She was re-coumadinized prior to discharge.  Since hospital discharge.  She denies recurrent chest pain.  Her sleep is poor.  According to family members they have witnessed apnea.  She was supposed to have a sleep study prior to this office visit, but this was not done because she was  rehospitalized in February 2016.  She was found to have AV graft bleeding/degeneration and underwent hemodialysis graft revision of her left thigh AV graft.  She was on heparin therapy during that time and ultimately was restarted back on Coumadin.  Since I last saw her, she was hospitalized from 05/11/2014 through 05/20/2014 on the vascular service with complications due to renal dialysis device implant and graft.  She also was seen in the emergency room with paroxysmal SVT which was treated with adenosine in 06/09/2014 and was seen on 06/17/2014 with mild chest pain and palpitations.  She presents now for evaluation.    Past Medical History  Diagnosis Date  . 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 Chronic back pain   . CAD (coronary artery disease)   . Obesity   . Peripheral vascular disease hands and legs  . Anxiety   . Asthma   . GERD (gastroesophageal reflux disease)     takes Omeprazole daily  . Chronic constipation     takes MIralax and Colace daily  . Anemia     takes Folic Acid daily  . Hypothyroidism     takes Synthroid daily  . Depression     takes Cymbalta for "severe" depression  . Neuromuscular disorder     carpal tunnel in right hand  . Hypertension     takes Metoprolol and Lisinopril daily, sees [name]  . COPD (chronic obstructive pulmonary disease)   .  Pneumonia     "today and several times before" (08/30/2012)  . Chronic bronchitis     "q yr w/season changes" (08/30/2012)  . Type II diabetes mellitus     Levemir 2units daily if > 150  . History of blood transfusion     "several" (08/30/2012)  . Hepatitis C   . Migraine     "last migraine was in 2013" (08/30/2012)  . Headache     "at least monthly" (08/30/2012)  . Arthritis     "left hand, back" (08/30/2012)  . End stage renal disease on dialysis 02/27/2011    Kidneys shut down at time of liver transplant in Sept 2011 at [facility] in  [place], she has been on HD ever since.  Dialyzes at [facility] on TTS schedule.  Had L forearm graft used 10 months then removed Dec 2012 due to suspected infection.  A right upper arm AVG was placed Dec 2012 but she developed steal symptoms acutely and it was ligated the same day.  Never had an AV fistula due to small veins.  Now has L thigh AVG put in Jan 2013, has not clotted to date.    [name] CAD (coronary artery disease) Jan. 2015    Cath: 20% LAD, 50% D1; s/p LIMA-LAD  . Aortic stenosis   . Tobacco abuse   . S/P aortic valve replacement 03/15/13    Mechanical   . SVT (supraventricular tachycardia) 06/09/14    Past Surgical History  Procedure Laterality Date  . Liver transplant  10/25/2009    sees [name] 1 every 6 months, saw last in Dec 2013. [name] Coord [phone]  . Small intestine surgery  90's  . Thrombectomy    . Arteriovenous graft placement Left 10/03/10     forearm  . Avgg removal  12/23/2010    Procedure: REMOVAL OF ARTERIOVENOUS GORETEX GRAFT ([place]);  Surgeon: Elam Dutch, MD;  Location: [place];  Service: Vascular;  Laterality: Left;  procedure started _0 -1852  . Insertion of dialysis catheter  12/23/2010    Procedure: INSERTION OF DIALYSIS CATHETER;  Surgeon: Elam Dutch, MD;  Location: [place];  Service: Vascular;  Laterality: Right;  Right Internal Jugular 28cm dialysis catheter insertion procedure time 1701-1720   . Cholecystectomy  1993  . Cystoscopy  1990's  . Spinal growth rods  2010    "put 2 metal rods in my back; they had detetriorated" (08/30/2012)  . Av fistula placement  01/29/2011    Procedure: INSERTION OF ARTERIOVENOUS (AV) GORE-TEX GRAFT ARM;  Surgeon: Elam Dutch, MD;  Location: [place];  Service: Vascular;  Laterality: Right;  . Av fistula placement  03/10/2011    Procedure: INSERTION OF ARTERIOVENOUS (AV) GORE-TEX GRAFT THIGH;  Surgeon: Elam Dutch, MD;  Location: [place];  Service: Vascular;  Laterality: Left;  . Tubal  ligation  1990's  . Aortic valve replacement N/A 03/15/2013    AVR; Surgeon: Ivin Poot, MD;  Location: [facility] OR; Open Heart Surgery;  4m[employer] mechanical prosthesis, top hat valve  . Coronary artery bypass graft N/A 03/15/2013    Procedure: CORONARY ARTERY BYPASS GRAFTING (CABG) times one using left internal mammary artery.;  Surgeon: PIvin Poot MD;  [place]  Service: Open Heart Surgery;  Laterality: N/A;  POSS CABG X 1  . Intraoperative transesophageal echocardiogram N/A 03/15/2013    Procedure: INTRAOPERATIVE TRANSESOPHAGEAL ECHOCARDIOGRAM;  Surgeon: PIvin Poot MD;  [place]  Service: Open Heart Surgery;  Laterality: N/A;  . Left heart catheterization with coronary/graft angiogram N/A 12/24/2011    Procedure: LEFT HEART CATHETERIZATION WITH Beatrix Fetters;  Surgeon: [name], MD;  Location: [facility] CATH LAB;  Service: Cardiovascular;  Laterality: N/A;  . Left heart catheterization with coronary angiogram N/A 07/29/2012    Procedure: LEFT HEART CATHETERIZATION WITH CORONARY ANGIOGRAM;  Surgeon: Troy Sine, MD;  Location: [facility] CATH LAB;  Service: Cardiovascular;  Laterality: N/A;  . Left heart catheterization with coronary angiogram N/A 03/10/2013    Procedure: LEFT HEART CATHETERIZATION WITH CORONARY ANGIOGRAM;  Surgeon: Troy Sine, MD;  Location: [facility] CATH LAB;  Service: Cardiovascular;  Laterality: N/A;  . Left heart catheterization with coronary/graft angiogram N/A 12/16/2013    Procedure: LEFT HEART CATHETERIZATION WITH Beatrix Fetters;  Surgeon: Troy Sine, MD;  Location: [facility] CATH LAB;  Service: Cardiovascular;  Laterality: N/A;  . Thrombectomy and revision of arterioventous (av) goretex  graft Left 03/30/2014  . Thrombectomy and revision of arterioventous (av) goretex  graft Left 03/30/2014    Procedure: THROMBECTOMY AND REVISION OF ARTERIOVENTOUS (AV) GORETEX  GRAFT;  Surgeon: Conrad Preston, MD;  Location: [place];  Service: Vascular;   Laterality: Left;  . Shuntogram Left 05/15/2014    Procedure: SHUNTOGRAM;  Surgeon: Conrad [employer], MD;  Location: [facility] CATH LAB;  Service: Cardiovascular;  Laterality: Left;    Allergies  Allergen Reactions  . Acetaminophen Other (See Comments)    Liver transplant recipient   . Codeine Itching  . Mirtazapine Other (See Comments)    hallucination  . Morphine Itching and Other (See Comments)    hallucinate    Current Outpatient Prescriptions  Medication Sig Dispense Refill  . albuterol (PROVENTIL,VENTOLIN) 90 MCG/ACT inhaler Inhale 1-2 puffs into the lungs every 6 (six) hours as needed for wheezing or shortness of breath.     [name] amLODipine (NORVASC) 2.5 MG tablet Take 1 tablet daily on non dialysis days. (Patient taking differently: Take 1 tablet daily on non dialysis days. So everyday except tue,thurs,sat) 30 tablet 6  . aspirin EC 81 MG tablet Take 81 mg by mouth daily. Was given 4 tablets tonight    . calcium acetate (PHOSLO) 667 MG capsule Take 667-2,001 mg by mouth See admin instructions. Take 3 capsules (2001 mg) with meals and 1 capsule (667 mg) with snacks    . cinacalcet (SENSIPAR) 30 MG tablet Take 30 mg by mouth daily.    [name] gabapentin (NEURONTIN) 300 MG capsule Take 300 mg by mouth at bedtime.     . insulin aspart (NOVOLOG) 100 UNIT/ML injection Inject 1-3 Units into the skin 3 (three) times daily as needed for high blood sugar (CBG>150). Per sliding scale    . levothyroxine (SYNTHROID, LEVOTHROID) 150 MCG tablet Take 150 mcg by mouth daily before breakfast.    . metoprolol tartrate (LOPRESSOR) 25 MG tablet Take 25 mg by mouth 2 (two) times daily.     . nitroGLYCERIN (NITROSTAT) 0.4 MG SL tablet Place 1 tablet (0.4 mg total) under the tongue every 5 (five) minutes as needed for chest pain. 30 tablet 0  . ondansetron (ZOFRAN-ODT) 4 MG disintegrating tablet Take 4 mg by mouth every 8 (eight) hours as needed for nausea or vomiting.    . tacrolimus (PROGRAF) 1 MG capsule Take 3 mg by  mouth 2 (two) times daily.     . traMADol (ULTRAM) 50 MG tablet Take 1 tablet (50 mg total) by mouth every 12 (twelve) hours as needed for moderate  pain. 20 tablet 0  . warfarin (COUMADIN) 2 MG tablet Take 2 tablets by mouth daily or as directed by coumadin clinic 60 tablet 2  . warfarin (COUMADIN) 4 MG tablet Take 1 tablet (4 mg total) by mouth daily at 6 PM. (Patient taking differently: Take 2-4 mg by mouth daily at 6 PM. 45m every day except Friday. Only 255mon Fridays.) 30 tablet 1  . verapamil (CALAN) 120 MG tablet Take 1 tablet (120 mg total) by mouth at bedtime. 30 tablet 6   No current facility-administered medications for this visit.    History   Social History  . Marital Status: Divorced    Spouse Name: N/A  . Number of Children: N/A  . Years of Education: N/A   Occupational History  . Not on file.   Social History Main Topics  . Smoking status: Current Some Day Smoker -- 0.75 packs/day for 40 years    Types: Cigarettes    Last Attempt to Quit: 03/13/2013  . Smokeless tobacco: Never Used     Comment: still smokng 1 or 2 a day  . Alcohol Use: 0.0 oz/week    0 Standard drinks or equivalent per week     Comment: 08/30/2012 "last drink was at a wedding July, 2014; had a small glass of wine; never had problems w/alcohol"  . Drug Use: No  . Sexual Activity: Not Currently   Other Topics Concern  . Not on file   Social History Narrative    Family History  Problem Relation Age of Onset  . Cancer Mother   . Diabetes Mother   . Hypertension Mother   . Stroke Mother   . Cancer Father   . Anesthesia problems Neg Hx   . Hypotension Neg Hx   . Malignant hyperthermia Neg Hx   . Pseudochol deficiency Neg Hx     ROS General: Negative; No fevers, chills, or night sweats HEENT: Negative; No changes in vision or hearing, sinus congestion, difficulty swallowing Pulmonary: Negative; No cough, wheezing, shortness of breath, hemoptysis Cardiovascular: See history of present  illness GI: Negative; No nausea, vomiting, diarrhea, or abdominal pain GU: Negative; No dysuria, hematuria, or difficulty voiding Musculoskeletal: Negative; no myalgias, joint pain, or weakness Hematologic: Negative; no easy bruising, bleeding Endocrine: Positive for hypothyroidism; no heat/cold intolerance; no diabetes, Neuro: Perform neuropathy; no changes in balance, headaches Skin: Negative; No rashes or skin lesions Psychiatric: Negative; No behavioral problems, depression Sleep: positive for witnessed apnea No snoring,  daytime sleepiness, hypersomnolence, bruxism, restless legs, hypnogognic hallucinations. Other comprehensive 14 point system review is negative    Physical Exam BP 152/72 mmHg  Pulse 77  Ht _0  (1.626 m)  Wt 183 lb 12.8 oz (83.371 kg)  BMI 31.53 kg/m2   Wt Readings from Last 3 Encounters:  06/19/14 183 lb 12.8 oz (83.371 kg)  05/20/14 175 lb 11.3 oz (79.7 kg)  05/01/14 179 lb (81.194 kg)   General: Alert, oriented, no distress.  Skin: normal turgor, no rashes, warm and dry HEENT: Normocephalic, atraumatic. Pupils equal round and reactive to light; sclera anicteric; extraocular muscles intact, No lid lag; Nose without nasal septal hypertrophy; Mouth/Parynx benign; Mallinpatti scale 3 Neck: No JVD, no carotid bruits; normal carotid upstroke Lungs: clear to ausculatation and percussion bilaterally; no wheezing or rales, normal inspiratory and expiratory effort Chest wall: without tenderness to palpitation Heart: PMI not displaced, RRR, s1 s2 normal, 2/6 systolic murmur, crisp mechanical heart sounds; No diastolic murmur, no rubs, gallops, thrills, or  heaves Abdomen: soft, nontender; no hepatosplenomehaly, BS+; abdominal aorta nontender and not dilated by palpation. Back: no CVA tenderness Pulses: 2+  Musculoskeletal: full range of motion, normal strength, no joint deformities Extremities: Pulses 2+, no clubbing cyanosis or edema, Homan's sign negative    Neurologic: grossly nonfocal; Cranial nerves grossly wnl Psychologic: Normal mood and affect  ECG (independently read by me): Normal sinus rhythm at 77 bpm.  Nondiagnostic T changes in leads 1 aVL and V6.  March 2016 ECG (independently read by me): Normal sinus rhythm at 69 bpm.  Borderline first degree AV block with a PR interval 204 ms.  Inferolateral T wave abnormality.  November 2015 ECG (independently read by me): Normal sinus rhythm.  Borderline first-degree AV block with a PR interval of 202.  T abnormalities in leads 1 and L2 and V6.  LVH by voltage.  May 2015 ECG (independently read by me): Normal sinus rhythm at 72 beats per minute.  T wave changes in leads 1 and L. and V6.  QTC 486 ms.  LABS:  BMP Latest Ref Rng 06/17/2014 06/09/2014 05/20/2014  Glucose 70 - 99 mg/dL 196(H) 228(H) 118(H)  BUN 6 - 20 mg/dL 13 26(H) 53(H)  Creatinine 0.44 - 1.00 mg/dL 4.05(H) 6.48(H) 8.82(H)  Sodium 135 - 145 mmol/L 137 133(L) 132(L)  Potassium 3.5 - 5.1 mmol/L 3.1(L) 3.6 5.1  Chloride 101 - 111 mmol/L 95(L) 92(L) 95(L)  CO2 22 - 32 mmol/L _0 Calcium 8.9 - 10.3 mg/dL 8.3(L) 8.4 8.3(L)    Hepatic Function Latest Ref Rng 06/17/2014 06/09/2014 05/20/2014  Total Protein 6.5 - 8.1 g/dL 7.3 7.6 -  Albumin 3.5 - 5.0 g/dL 2.9(L) 3.1(L) 2.7(L)  AST 15 - 41 U/L 33 46(H) -  ALT 14 - 54 U/L 19 22 -  Alk Phosphatase 38 - 126 U/L 94 98 -  Total Bilirubin 0.3 - 1.2 mg/dL 0.8 0.6 -  Bilirubin, Direct 0.0 - 0.5 mg/dL - - -    CBC Latest Ref Rng 06/17/2014 06/09/2014 05/20/2014  WBC 4.0 - 10.5 K/uL 4.5 5.7 5.6  Hemoglobin 12.0 - 15.0 g/dL 11.9(L) 11.8(L) 9.4(L)  Hematocrit 36.0 - 46.0 % 36.7 36.4 29.3(L)  Platelets 150 - 400 K/uL 83(L) PLATELET CLUMPS NOTED ON SMEAR, COUNT APPEARS DECREASED 100(L)   Lab Results  Component Value Date   MCV 96.1 06/17/2014   MCV 96.6 06/09/2014   MCV 95.4 05/20/2014     BNP    Component Value Date/Time   PROBNP 33098.0* 01/02/2014 2318    Lipid Panel      Component Value Date/Time   CHOL 225* 12/22/2011 1030   TRIG 121 12/22/2011 1030   HDL 97 12/22/2011 1030   CHOLHDL 2.3 12/22/2011 1030   VLDL 24 12/22/2011 1030   LDLCALC 104* 12/22/2011 1030     RADIOLOGY: Dg Chest 2 View  06/01/2013   CLINICAL DATA:  History of aortic valve replacement in February, follow-up  EXAM: CHEST  2 VIEW  COMPARISON:  Chest x-ray 04/20/2013, and CT chest of 03/11/2013  FINDINGS: Cardiomegaly is stable. There is very mild pulmonary vascular congestion present. No effusion is seen. A nodular opacity in the medial left lung apex is stable and compared to the prior CT chest represents a bony density from the upper thoracic spine. Median sternotomy sutures remain and an aortic valve replacement is present. No acute bony abnormality is seen. Multiple surgical clips are present within the epigastrium.  IMPRESSION: 1. Stable cardiomegaly. 2. Mild pulmonary vascular congestion.  Electronically Signed   By: Ivar Drape M.D.   On: 06/01/2013 09:40      ASSESSMENT AND PLAN:  Ms. Shamel Galyean is a 59 year old African-American female who underwent mechanical aortic valve replacement for moderately severe symptomatic aortic stenosis and single-vessel LIMA to LAD CABG revascularization surgery in February 2015.  She had been readmitted with episodes of tachycardia and shortness of breath.  She recently was hospitalized with AV graft laterality necessitating revision and underwent redo left thigh shuntogram.  Due to her mechanical valve, she was bridged with Lovenox and was readmitted in late March for this surgery.  She has developed recurrent episode of SVT which responsive to adenosine.  Her blood pressure today is slightly increased at 152/72, but at times she becomes hypotensive on dialysis days.  With her history of recent SVT, I am recommending she discontinue her amlodipine and will start her on Verapamil SR 120 mg at bedtime.  She will be undergoing a sleep study to evaluate  for obstructive sleep apnea and this is scheduled for 07/02/2014.  I also have recommended that she can take an extra metoprolol on a when necessary basis if she does note recurrent tachyarrhythmias.   Troy Sine, MD, [facility]  06/22/2014 6:34 PM

## 2014-06-23 ENCOUNTER — Ambulatory Visit (INDEPENDENT_AMBULATORY_CARE_PROVIDER_SITE_OTHER): Payer: Medicare Other | Admitting: Pharmacist Clinician (PhC)/ Clinical Pharmacy Specialist

## 2014-06-23 DIAGNOSIS — Z7901 Long term (current) use of anticoagulants: Secondary | ICD-10-CM

## 2014-06-23 DIAGNOSIS — Z952 Presence of prosthetic heart valve: Secondary | ICD-10-CM

## 2014-06-23 DIAGNOSIS — Z954 Presence of other heart-valve replacement: Secondary | ICD-10-CM

## 2014-06-29 ENCOUNTER — Encounter: Payer: Self-pay | Admitting: Vascular Surgery

## 2014-06-29 DIAGNOSIS — I359 Nonrheumatic aortic valve disorder, unspecified: Secondary | ICD-10-CM | POA: Diagnosis not present

## 2014-06-29 LAB — PROTIME-INR: INR: 3.5 — AB (ref <=1.1)

## 2014-06-30 ENCOUNTER — Ambulatory Visit (INDEPENDENT_AMBULATORY_CARE_PROVIDER_SITE_OTHER): Payer: Medicare Other | Admitting: Pharmacist Clinician (PhC)/ Clinical Pharmacy Specialist

## 2014-06-30 ENCOUNTER — Encounter: Payer: Self-pay | Admitting: Vascular Surgery

## 2014-06-30 ENCOUNTER — Ambulatory Visit (INDEPENDENT_AMBULATORY_CARE_PROVIDER_SITE_OTHER): Payer: Self-pay | Admitting: Vascular Surgery

## 2014-06-30 VITALS — BP 152/62 | HR 70 | Ht 64.0 in | Wt 179.0 lb

## 2014-06-30 DIAGNOSIS — Z952 Presence of prosthetic heart valve: Secondary | ICD-10-CM

## 2014-06-30 DIAGNOSIS — Z7901 Long term (current) use of anticoagulants: Secondary | ICD-10-CM

## 2014-06-30 DIAGNOSIS — N186 End stage renal disease: Secondary | ICD-10-CM

## 2014-06-30 DIAGNOSIS — Z954 Presence of other heart-valve replacement: Secondary | ICD-10-CM

## 2014-06-30 NOTE — Progress Notes (Signed)
    Postoperative Access Visit   History of Present Illness  [name] is a 59 y.o. year old female who presents for postoperative follow-up for: PTA L thigh AVG (05/15/14), L thigh AVG revision (03/30/14).  The patient's wounds are healed.  The patient notes no steal symptoms.  The patient is able to complete their activities of daily living.  The patient's current symptoms are: continue extended bleeding from cannulation site.  No flow rate issues are noted..  For VQI Use Only  PRE-ADM LIVING: Home  AMB STATUS: Ambulatory  Physical Examination Filed Vitals:   06/30/14 1138  BP: 152/62  Pulse: 70    LLE: Incisions are healed, skin feels warm, no signs of L foot ischemia, sensation in digits is intact, palpable thrill, bruit can be auscultated   Medical Decision Making  [name] is a 59 y.o. year old female who presents s/p L thigh AVG revision and CFV venoplasty, anticoagulation for mechanical valve   This patient's bleeding is due to anticoagulation for her mechanical valve (target INR >2.5), as she continued to bleed after venoplasty of the L CFV.  We discussed repeating the venoplasty as needed if she develops any flow rate issues, especially given the limited patency of venoplasty.  Thank you for allowing [place] to participate in this patient's care.  Adele Barthel, MD [facility] Office: [phone] Pager: [phone]  06/30/2014, 1:35 PM

## 2014-07-02 ENCOUNTER — Ambulatory Visit (HOSPITAL_BASED_OUTPATIENT_CLINIC_OR_DEPARTMENT_OTHER): Payer: Medicare Other | Attending: Cardiovascular Disease

## 2014-07-02 VITALS — Ht 63.0 in | Wt 170.0 lb

## 2014-07-02 DIAGNOSIS — G4710 Hypersomnia, unspecified: Secondary | ICD-10-CM

## 2014-07-02 DIAGNOSIS — R0683 Snoring: Secondary | ICD-10-CM | POA: Diagnosis not present

## 2014-07-02 DIAGNOSIS — G4730 Sleep apnea, unspecified: Secondary | ICD-10-CM | POA: Diagnosis not present

## 2014-07-02 DIAGNOSIS — R0681 Apnea, not elsewhere classified: Secondary | ICD-10-CM

## 2014-07-06 DIAGNOSIS — I359 Nonrheumatic aortic valve disorder, unspecified: Secondary | ICD-10-CM | POA: Diagnosis not present

## 2014-07-06 LAB — PROTIME-INR: INR: 2.8 — AB (ref 0.9–1.1)

## 2014-07-11 DIAGNOSIS — T8640 Unspecified complication of liver transplant: Secondary | ICD-10-CM | POA: Diagnosis not present

## 2014-07-11 DIAGNOSIS — Z992 Dependence on renal dialysis: Secondary | ICD-10-CM | POA: Diagnosis not present

## 2014-07-11 DIAGNOSIS — N186 End stage renal disease: Secondary | ICD-10-CM | POA: Diagnosis not present

## 2014-07-12 ENCOUNTER — Encounter: Payer: Self-pay | Admitting: Cardiovascular Disease

## 2014-07-13 ENCOUNTER — Ambulatory Visit (INDEPENDENT_AMBULATORY_CARE_PROVIDER_SITE_OTHER): Payer: Medicare Other | Admitting: Pharmacist Clinician (PhC)/ Clinical Pharmacy Specialist

## 2014-07-13 DIAGNOSIS — N186 End stage renal disease: Secondary | ICD-10-CM | POA: Diagnosis not present

## 2014-07-13 DIAGNOSIS — Z7901 Long term (current) use of anticoagulants: Secondary | ICD-10-CM

## 2014-07-13 DIAGNOSIS — N2581 Secondary hyperparathyroidism of renal origin: Secondary | ICD-10-CM | POA: Diagnosis not present

## 2014-07-13 DIAGNOSIS — D509 Iron deficiency anemia, unspecified: Secondary | ICD-10-CM | POA: Diagnosis not present

## 2014-07-13 DIAGNOSIS — Z952 Presence of prosthetic heart valve: Secondary | ICD-10-CM

## 2014-07-13 DIAGNOSIS — I359 Nonrheumatic aortic valve disorder, unspecified: Secondary | ICD-10-CM | POA: Diagnosis not present

## 2014-07-13 DIAGNOSIS — Z954 Presence of other heart-valve replacement: Secondary | ICD-10-CM

## 2014-07-13 DIAGNOSIS — E118 Type 2 diabetes mellitus with unspecified complications: Secondary | ICD-10-CM | POA: Diagnosis not present

## 2014-07-13 LAB — PROTIME-INR: INR: 2.4 — AB (ref 0.9–1.1)

## 2014-07-14 ENCOUNTER — Ambulatory Visit (INDEPENDENT_AMBULATORY_CARE_PROVIDER_SITE_OTHER): Payer: Medicare Other | Admitting: Pharmacist Clinician (PhC)/ Clinical Pharmacy Specialist

## 2014-07-14 DIAGNOSIS — Z954 Presence of other heart-valve replacement: Secondary | ICD-10-CM

## 2014-07-14 DIAGNOSIS — Z7901 Long term (current) use of anticoagulants: Secondary | ICD-10-CM

## 2014-07-14 DIAGNOSIS — Z952 Presence of prosthetic heart valve: Secondary | ICD-10-CM

## 2014-07-16 NOTE — Addendum Note (Signed)
Addended by: Shelva Majestic A on: 07/16/2014 01:10 PM   Modules accepted: Level of Service

## 2014-07-16 NOTE — Sleep Study (Signed)
NAME: [name] DATE OF BIRTH:  11/19/1955 MEDICAL RECORD NUMBER [id number]  LOCATION: [facility]  PHYSICIAN: KELLY,THOMAS A  DATE OF STUDY: 07/02/2014  SLEEP STUDY TYPE: Nocturnal Polysomnogram               REFERRING PHYSICIAN: Troy Sine, MD  INDICATION FOR STUDY: [name] is a 59 year old female with end-stage renal disease, status post CABG surgery, status post aVR, who is had difficulty with sleep, snoring, and witnessed apnea.  She is referred for sleep study.  EPWORTH SLEEPINESS SCORE:  17 HEIGHT: 5\' 3"  (160 cm)  WEIGHT: 77.111 kg (170 lb)    Body mass index is 30.12 kg/(m^2).  NECK SIZE: 14 in.  MEDICATIONS:  warfarin (COUMADIN) 4 MG tablet 4 mg, Daily-1800  Patient taking differently: 2-4 mg Oral Daily-1800, 4mg  every day except Friday. Only 2mg  on Fridays., Indications: mechanic aortic valve, Reason: Added Indication / FREQ , Informant: Child, Reported on 06/17/2014  warfarin (COUMADIN) 2 MG tablet verapamil (CALAN) 120 MG tablet 120 mg, Daily at bedtime traMADol (ULTRAM) 50 MG tablet 50 mg, Every 12 hours PRN tacrolimus (PROGRAF) 1 MG capsule 3 mg, 2 times daily PROAIR HFA 108 (90 BASE) MCG/ACT inhaler ondansetron (ZOFRAN-ODT) 4 MG disintegrating tablet 4 mg, Every 8 hours PRN nitroGLYCERIN (NITROSTAT) 0.4 MG SL tablet 0.4 mg, Every 5 min PRN metoprolol tartrate (LOPRESSOR) 25 MG tablet 25 mg, 2 times daily levothyroxine (SYNTHROID, LEVOTHROID) 150 MCG tablet 150 mcg, Daily before breakfast insulin aspart (NOVOLOG) 100 UNIT/ML injection 150). Per sliding scale, Until Discontinued, Historical Med"1-3 Units, 3 times daily PRN  Note: PRN (Written 05/11/2014 1840)  gabapentin (NEURONTIN) 300 MG capsule 300 mg, Daily at bedtime enoxaparin (LOVENOX) 80 MG/0.8ML injection cinacalcet (SENSIPAR) 30 MG tablet 30 mg, Daily calcium acetate (PHOSLO) 667 MG capsule 667-2,001 mg, See admin instructions aspirin EC 81 MG tablet 81 mg, Daily amLODipine (NORVASC)  2.5 MG tablet  Patient taking differently: Take 1 tablet daily on non dialysis days. So everyday except tue,thurs,sat, Reason: Added Indication / FREQ , Informant: Multiple Informants, Reported on 06/09/2014  albuterol (PROVENTIL,VENTOLIN) 90 MCG/ACT inhaler 1-2 puff, Every 6 hours PRN   SLEEP ARCHITECTURE:  Total recording time was 374.5 minutes.  Total sleep time 328 minutes.  Sleep efficiency 87.6%.  Latency to sleep onset was very brief at 2.5 minutes.  There was prolonged in latency  To REM sleep to 220.5 minutes.  The patient slept 7.9% of the night in supine sleep.  There was a very high arousal count at 418 with an index of 76.5/hr.  There was moderate snoring  RESPIRATORY DATA:  The patient slept for 55 minutes (16.8%) in stage I, 209.5 minutes (63.9%) in stage II, 0 minutes in stage III, and 63.5 minutes (19.4%) in REM sleep.  There were 8 obstructive apneas, 10 central apneas, 1 mixed apnea.  The apnea hypotony index (AHI) was 3.7 per hour;  however, the respiratory disturbance index (RDI) was significantly elevated at 61.3 per hour.  There were 315 RERA's  and the RERA  Index was elevated at 57.6.  OXYGEN DATA:  The baseline oxygen saturation was 100%.  His lowest oxygen saturation with REM sleep was 98%, and non-REM sleep 100%  CARDIAC DATA:  The average heart rate was 68 bpm.  MOVEMENT/PARASOMNIA:  0 periodic limb movements.  IMPRESSION/ RECOMMENDATION:   Abnormal diagnostic polysomnogram demonstrating a significant increase in the respiratory disturbance index (RDI) at 61.3 per hour although the AHI was 3.7  per hour.  There are total of 9 obstructive apneas, 10 central apneas, and 1 mixed apnea. Abnormal sleep architecture with absence of slow-wave sleep and prolonged latency non-REM sleep. Moderate snoring. No significant oxygen desaturation. Consider CPAP/BiPAP titration if can be improved by insurance in this patient with significant cardiovascular  comorbidities.   [place], American Board of Sleep Medicine  ELECTRONICALLY SIGNED ON:  07/16/2014, 12:54 PM [place] PH: [phone]   FX: [phone] [place]

## 2014-07-19 ENCOUNTER — Telehealth: Payer: Self-pay | Admitting: *Deleted

## 2014-07-19 NOTE — Telephone Encounter (Signed)
Called patient to inform her appointment needed to discuss sleep study results. Appointment scheduled for 08/02/14.

## 2014-07-19 NOTE — Progress Notes (Signed)
Appointment scheduled to discuss sleep study.

## 2014-07-21 ENCOUNTER — Encounter: Payer: Self-pay | Admitting: Cardiovascular Disease

## 2014-07-21 DIAGNOSIS — K769 Liver disease, unspecified: Secondary | ICD-10-CM | POA: Diagnosis not present

## 2014-07-21 DIAGNOSIS — Z79899 Other long term (current) drug therapy: Secondary | ICD-10-CM | POA: Diagnosis not present

## 2014-07-21 DIAGNOSIS — Z944 Liver transplant status: Secondary | ICD-10-CM | POA: Diagnosis not present

## 2014-07-25 DIAGNOSIS — I359 Nonrheumatic aortic valve disorder, unspecified: Secondary | ICD-10-CM | POA: Diagnosis not present

## 2014-07-25 LAB — PROTIME-INR: INR: 1.9 — AB (ref 0.9–1.1)

## 2014-07-27 ENCOUNTER — Ambulatory Visit (INDEPENDENT_AMBULATORY_CARE_PROVIDER_SITE_OTHER): Payer: Medicare Other | Admitting: Internal Medicine

## 2014-07-27 DIAGNOSIS — Z7901 Long term (current) use of anticoagulants: Secondary | ICD-10-CM

## 2014-07-27 DIAGNOSIS — Z954 Presence of other heart-valve replacement: Secondary | ICD-10-CM

## 2014-07-27 DIAGNOSIS — Z952 Presence of prosthetic heart valve: Secondary | ICD-10-CM

## 2014-08-01 DIAGNOSIS — I359 Nonrheumatic aortic valve disorder, unspecified: Secondary | ICD-10-CM | POA: Diagnosis not present

## 2014-08-01 LAB — PROTIME-INR: INR: 2.1 — AB (ref 0.9–1.1)

## 2014-08-02 ENCOUNTER — Ambulatory Visit: Payer: Medicare Other | Admitting: Cardiovascular Disease

## 2014-08-03 ENCOUNTER — Ambulatory Visit (INDEPENDENT_AMBULATORY_CARE_PROVIDER_SITE_OTHER): Payer: Medicare Other | Admitting: Internal Medicine

## 2014-08-03 DIAGNOSIS — I359 Nonrheumatic aortic valve disorder, unspecified: Secondary | ICD-10-CM | POA: Diagnosis not present

## 2014-08-03 DIAGNOSIS — Z952 Presence of prosthetic heart valve: Secondary | ICD-10-CM

## 2014-08-03 DIAGNOSIS — Z954 Presence of other heart-valve replacement: Secondary | ICD-10-CM

## 2014-08-03 DIAGNOSIS — Z7901 Long term (current) use of anticoagulants: Secondary | ICD-10-CM

## 2014-08-03 LAB — PROTIME-INR: INR: 2.2 — AB (ref 0.9–1.1)

## 2014-08-07 ENCOUNTER — Ambulatory Visit (INDEPENDENT_AMBULATORY_CARE_PROVIDER_SITE_OTHER): Payer: Medicare Other | Admitting: Cardiovascular Disease

## 2014-08-07 ENCOUNTER — Encounter: Payer: Self-pay | Admitting: Cardiovascular Disease

## 2014-08-07 VITALS — BP 138/88 | HR 81 | Ht 64.5 in | Wt 183.0 lb

## 2014-08-07 DIAGNOSIS — Z951 Presence of aortocoronary bypass graft: Secondary | ICD-10-CM | POA: Diagnosis not present

## 2014-08-07 DIAGNOSIS — R6889 Other general symptoms and signs: Secondary | ICD-10-CM | POA: Diagnosis not present

## 2014-08-07 DIAGNOSIS — F329 Major depressive disorder, single episode, unspecified: Secondary | ICD-10-CM | POA: Diagnosis not present

## 2014-08-07 DIAGNOSIS — I25708 Atherosclerosis of coronary artery bypass graft(s), unspecified, with other forms of angina pectoris: Secondary | ICD-10-CM

## 2014-08-07 DIAGNOSIS — G4733 Obstructive sleep apnea (adult) (pediatric): Secondary | ICD-10-CM | POA: Diagnosis not present

## 2014-08-07 DIAGNOSIS — G4730 Sleep apnea, unspecified: Secondary | ICD-10-CM

## 2014-08-07 DIAGNOSIS — N186 End stage renal disease: Secondary | ICD-10-CM

## 2014-08-07 DIAGNOSIS — Z992 Dependence on renal dialysis: Secondary | ICD-10-CM

## 2014-08-07 DIAGNOSIS — I471 Supraventricular tachycardia: Secondary | ICD-10-CM | POA: Diagnosis not present

## 2014-08-07 DIAGNOSIS — Z954 Presence of other heart-valve replacement: Secondary | ICD-10-CM

## 2014-08-07 DIAGNOSIS — Z952 Presence of prosthetic heart valve: Secondary | ICD-10-CM

## 2014-08-07 NOTE — Patient Instructions (Signed)
Your physician has recommended that you have BIPAP TITRATION a sleep study. This test records several body functions during sleep, including: brain activity, eye movement, oxygen and carbon dioxide blood levels, heart rate and rhythm, breathing rate and rhythm, the flow of air through your mouth and nose, snoring, body muscle movements, and chest and belly movement.  Your physician recommends that you schedule a follow-up appointment in: 3-4 MONTHS.

## 2014-08-09 ENCOUNTER — Encounter: Payer: Self-pay | Admitting: Cardiovascular Disease

## 2014-08-09 NOTE — Progress Notes (Signed)
Patient ID: [name], female   DOB: 12/23/1955, 59 y.o.   MRN: [id number]    HPI: [name] is a 59 y.o. African American  female who presents to the office today for a 7 week  follow up cardiology evaluation.  [name] has end-stage renal disease and is on chronic hemodialysis.  After undergoing liver transplantation at [facility] for hepatitis C  She was admitted to [facility] in January 2015 with increasing chest pain.  A dobutamine echo study suggested severe AS with an ejection fraction of 45%.  Cardiac catheterization revealed mild CAD with 50% narrowing in moderately severe aortic stenosis with aortic valve area in the range of 0.8-1.2.  She ultimately underwent aortic valve replacement with mechanical aortic valve and single vessel LIMA to LAD CABG revascularization surgery on 03/15/2013 by Dr. [name].  Following discharge, she presented to the hospital for further evaluation of chest pain and palpitations.  Troponins were negative.  Beta blocker therapy was increased to following discharge.   She undergoes dialysis on Tuesday, Thursdays, and Saturdays.  She was again hospitalized from November 5 through 12/21/2013 and presented with chest pain episodes which occurred during dialysis.  Her ECG showed new T-wave inversion in inferior leads.  She underwent a repeat cardiac catheterization on November 6 by me which showed a focal 70% mid LAD stenosis after the takeoff of the second diagonal vessel, but the LIMA graft to the mid LAD was patent.  The left circumflex vessel had 20% smooth narrowing in the second marginal branch.  The large dominant RCA was normal.  Medical therapy was recommended.  She was re-coumadinized prior to discharge.  Since hospital discharge.  She denies recurrent chest pain.  Her sleep is poor.  According to family members they have witnessed apnea.  She was supposed to have a sleep study prior to this office visit, but this was not done because she was  rehospitalized in February 2016.  She was found to have AV graft bleeding/degeneration and underwent hemodialysis graft revision of her left thigh AV graft.  She was on heparin therapy during that time and ultimately was restarted back on Coumadin.  She was hospitalized from 05/11/2014 through 05/20/2014 on the vascular service with complications due to renal dialysis device implant and graft.  She also was seen in the emergency room with paroxysmal SVT which was treated with adenosine in 06/09/2014 and was seen on 06/17/2014 with mild chest pain and palpitations.    Since I last saw her, she underwent a sleep study to evaluate for sleep apnea.  She sleeps very poorly, admits to fatigue and daytime sleepiness.  Her sleep study was abnormal and demonstrated a significant increase in the respiratory disturbance index at 61.3/h, although the AHI was calculated at just 3.7/h.  She had a total of 9 obstructive apneas, 10 central apneas and 1 mixed apnea.  Her sleep architecture was abnormal with absence of slow-wave sleep and prolonged latency 2 REM sleep.  She had moderate snoring.  She did not have significant oxygen desaturation.   She is undergoing dialysis on Tuesday, Thursdays and Saturdays.  She believes she does not sleep as well on the days that she does not have dialysis admits to some shortness of breath nocturnally.  She is unaware where of any recurrent episodes of supraventricular tachycardia.    Past Medical History  Diagnosis Date  . 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 Chronic back pain   . CAD (coronary artery disease)   . Obesity   . Peripheral vascular disease hands and legs  . Anxiety   . Asthma   . GERD (gastroesophageal reflux disease)     takes Omeprazole daily  . Chronic constipation     takes MIralax and Colace daily  . Anemia     takes Folic Acid daily  . Hypothyroidism     takes Synthroid  daily  . Depression     takes Cymbalta for "severe" depression  . Neuromuscular disorder     carpal tunnel in right hand  . Hypertension     takes Metoprolol and Lisinopril daily, sees Dr Bea Graff  . COPD (chronic obstructive pulmonary disease)   . Pneumonia     "today and several times before" (08/30/2012)  . Chronic bronchitis     "q yr w/season changes" (08/30/2012)  . Type II diabetes mellitus     Levemir 2units daily if > 150  . History of blood transfusion     "several" (08/30/2012)  . Hepatitis C   . Migraine     "last migraine was in 2013" (08/30/2012)  . Headache     "at least monthly" (08/30/2012)  . Arthritis     "left hand, back" (08/30/2012)  . End stage renal disease on dialysis 02/27/2011    Kidneys shut down at time of liver transplant in Sept 2011 at [facility] in [place], she has been on HD ever since.  Dialyzes at [facility] HD on TTS schedule.  Had L forearm graft used 10 months then removed Dec 2012 due to suspected infection.  A right upper arm AVG was placed Dec 2012 but she developed steal symptoms acutely and it was ligated the same day.  Never had an AV fistula due to small veins.  Now has L thigh AVG put in Jan 2013, has not clotted to date.    [name] CAD (coronary artery disease) Jan. 2015    Cath: 20% LAD, 50% D1; s/p LIMA-LAD  . Aortic stenosis   . Tobacco abuse   . S/P aortic valve replacement 03/15/13    Mechanical   . SVT (supraventricular tachycardia) 06/09/14    Past Surgical History  Procedure Laterality Date  . Liver transplant  10/25/2009    sees Dr Ferol Luz 1 every 6 months, saw last in Dec 2013. [name] Coord [phone]  . Small intestine surgery  90's  . Thrombectomy    . Arteriovenous graft placement Left 10/03/10     forearm  . Avgg removal  12/23/2010    Procedure: REMOVAL OF ARTERIOVENOUS GORETEX GRAFT ([place]);  Surgeon: Elam Dutch, MD;  Location: [place];  Service: Vascular;  Laterality: Left;  procedure started _0 -1852  . Insertion of  dialysis catheter  12/23/2010    Procedure: INSERTION OF DIALYSIS CATHETER;  Surgeon: Elam Dutch, MD;  Location: [place];  Service: Vascular;  Laterality: Right;  Right Internal Jugular 28cm dialysis catheter insertion procedure time 1701-1720   . Cholecystectomy  1993  . Cystoscopy  1990's  . Spinal growth rods  2010    "put 2 metal rods in my back; they had detetriorated" (08/30/2012)  . Av fistula placement  01/29/2011    Procedure: INSERTION OF ARTERIOVENOUS (AV) GORE-TEX GRAFT ARM;  Surgeon: Elam Dutch, MD;  Location: [place];  Service: Vascular;  Laterality: Right;  . Av fistula placement  03/10/2011    Procedure: INSERTION OF ARTERIOVENOUS (AV) GORE-TEX GRAFT THIGH;  Surgeon: Elam Dutch, MD;  Location: [place];  Service: Vascular;  Laterality: Left;  . Tubal ligation  1990's  . Aortic valve replacement N/A 03/15/2013    AVR; Surgeon: Ivin Poot, MD;  Location: [facility] OR; Open Heart Surgery;  68m[employer] mechanical prosthesis, top hat valve  . Coronary artery bypass graft N/A 03/15/2013    Procedure: CORONARY ARTERY BYPASS GRAFTING (CABG) times one using left internal mammary artery.;  Surgeon: PIvin Poot MD;  [place]  Service: Open Heart Surgery;  Laterality: N/A;  POSS CABG X 1  . Intraoperative transesophageal echocardiogram N/A 03/15/2013    Procedure: INTRAOPERATIVE TRANSESOPHAGEAL ECHOCARDIOGRAM;  Surgeon: PIvin Poot MD;  [place]  Service: Open Heart Surgery;  Laterality: N/A;  . Left heart catheterization with coronary/graft angiogram N/A 12/24/2011    Procedure: LEFT HEART CATHETERIZATION WITH CBeatrix Fetters  Surgeon: JLorretta Harp MD;  [facility];  Service: Cardiovascular;  Laterality: N/A;  . Left heart catheterization with coronary angiogram N/A 07/29/2012    Procedure: LEFT HEART CATHETERIZATION WITH CORONARY ANGIOGRAM;  Surgeon: TTroy Sine MD;  [facility];  Service: Cardiovascular;  Laterality: N/A;   . Left heart catheterization with coronary angiogram N/A 03/10/2013    Procedure: LEFT HEART CATHETERIZATION WITH CORONARY ANGIOGRAM;  Surgeon: TTroy Sine MD;  [facility];  Service: Cardiovascular;  Laterality: N/A;  . Left heart catheterization with coronary/graft angiogram N/A 12/16/2013    Procedure: LEFT HEART CATHETERIZATION WITH CBeatrix Fetters  Surgeon: TTroy Sine MD;  [facility];  Service: Cardiovascular;  Laterality: N/A;  . Thrombectomy and revision of arterioventous (av) goretex  graft Left 03/30/2014  . Thrombectomy and revision of arterioventous (av) goretex  graft Left 03/30/2014    Procedure: THROMBECTOMY AND REVISION OF ARTERIOVENTOUS (AV) GORETEX  GRAFT;  Surgeon: BConrad Needham MD;  [place]  Service: Vascular;  Laterality: Left;  . Shuntogram Left 05/15/2014    Procedure: SHUNTOGRAM;  Surgeon: BConrad Richville MD;  [facility];  Service: Cardiovascular;  Laterality: Left;    Allergies  Allergen Reactions  . Acetaminophen Other (See Comments)    Liver transplant recipient   . Codeine Itching  . Mirtazapine Other (See Comments)    hallucination  . Morphine Itching and Other (See Comments)    hallucinate    Current Outpatient Prescriptions  Medication Sig Dispense Refill  . albuterol (PROVENTIL,VENTOLIN) 90 MCG/ACT inhaler Inhale 1-2 puffs into the lungs every 6 (six) hours as needed for wheezing or shortness of breath.     .Marland Kitchenaspirin EC 81 MG tablet Take 81 mg by mouth daily. Was given 4 tablets tonight    . calcium acetate (PHOSLO) 667 MG capsule Take 667-2,001 mg by mouth See admin instructions. Take 3 capsules (2001 mg) with meals and 1 capsule (667 mg) with snacks    . cinacalcet (SENSIPAR) 30 MG tablet Take 30 mg by mouth daily.    .Marland Kitchengabapentin (NEURONTIN) 300 MG capsule Take 300 mg by mouth at bedtime.     . insulin aspart (NOVOLOG) 100 UNIT/ML injection Inject 1-3 Units into the skin 3 (three) times daily as  needed for high blood sugar (CBG>150). Per sliding scale    . levothyroxine (SYNTHROID, LEVOTHROID) 150 MCG tablet Take 150 mcg by mouth daily before breakfast.    . metoprolol tartrate (LOPRESSOR) 25 MG tablet  Take 25 mg by mouth 2 (two) times daily.     . nitroGLYCERIN (NITROSTAT) 0.4 MG SL tablet Place 1 tablet (0.4 mg total) under the tongue every 5 (five) minutes as needed for chest pain. 30 tablet 0  . ondansetron (ZOFRAN-ODT) 4 MG disintegrating tablet Take 4 mg by mouth every 8 (eight) hours as needed for nausea or vomiting.    [name] PROAIR HFA 108 (90 BASE) MCG/ACT inhaler     . tacrolimus (PROGRAF) 1 MG capsule Take 3 mg by mouth 2 (two) times daily.     . traMADol (ULTRAM) 50 MG tablet Take 1 tablet (50 mg total) by mouth every 12 (twelve) hours as needed for moderate pain. 20 tablet 0  . verapamil (CALAN) 120 MG tablet Take 1 tablet (120 mg total) by mouth at bedtime. 30 tablet 6  . warfarin (COUMADIN) 2 MG tablet Take 2 tablets by mouth daily or as directed by coumadin clinic 60 tablet 2  . warfarin (COUMADIN) 4 MG tablet Take 1 tablet (4 mg total) by mouth daily at 6 PM. (Patient taking differently: Take 2-4 mg by mouth daily at 6 PM. 60m every day except Friday. Only 242mon Fridays.) 30 tablet 1   No current facility-administered medications for this visit.    History   Social History  . Marital Status: Divorced    Spouse Name: N/A  . Number of Children: N/A  . Years of Education: N/A   Occupational History  . Not on file.   Social History Main Topics  . Smoking status: Current Some Day Smoker -- 0.75 packs/day for 40 years    Types: Cigarettes    Last Attempt to Quit: 03/13/2013  . Smokeless tobacco: Never Used     Comment: still smokng 1 or 2 a day  . Alcohol Use: 0.0 oz/week    0 Standard drinks or equivalent per week     Comment: 08/30/2012 "last drink was at a wedding July, 2014; had a small glass of wine; never had problems w/alcohol"  . Drug Use: No  . Sexual  Activity: Not Currently   Other Topics Concern  . Not on file   Social History Narrative    Family History  Problem Relation Age of Onset  . Cancer Mother   . Diabetes Mother   . Hypertension Mother   . Stroke Mother   . Cancer Father   . Anesthesia problems Neg Hx   . Hypotension Neg Hx   . Malignant hyperthermia Neg Hx   . Pseudochol deficiency Neg Hx     ROS General: Negative; No fevers, chills, or night sweats HEENT: Negative; No changes in vision or hearing, sinus congestion, difficulty swallowing Pulmonary: Negative; No cough, wheezing, shortness of breath, hemoptysis Cardiovascular: See history of present illness GI: Negative; No nausea, vomiting, diarrhea, or abdominal pain GU: Negative; No dysuria, hematuria, or difficulty voiding Musculoskeletal: Negative; no myalgias, joint pain, or weakness Hematologic: Negative; no easy bruising, bleeding Endocrine: Positive for hypothyroidism; no heat/cold intolerance; no diabetes, Neuro: Perform neuropathy; no changes in balance, headaches Skin: Negative; No rashes or skin lesions Psychiatric: Negative; No behavioral problems, depression Sleep: positive for witnessed apnea No snoring,  daytime sleepiness, hypersomnolence, bruxism, restless legs, hypnogognic hallucinations. Other comprehensive 14 point system review is negative    Physical Exam BP 138/88 mmHg  Pulse 81  Ht 5' 4.5" (1.638 m)  Wt 183 lb (83.008 kg)  BMI 30.94 kg/m2  SpO2 97%   Wt Readings from Last 3  Encounters:  08/07/14 183 lb (83.008 kg)  07/02/14 170 lb (77.111 kg)  06/30/14 179 lb (81.194 kg)   General: Alert, oriented, no distress.  Skin: normal turgor, no rashes, warm and dry HEENT: Normocephalic, atraumatic. Pupils equal round and reactive to light; sclera anicteric; extraocular muscles intact, No lid lag; Nose without nasal septal hypertrophy; Mouth/Parynx benign; Mallinpatti scale 3 Neck: No JVD, no carotid bruits; normal carotid  upstroke Lungs: clear to ausculatation and percussion bilaterally; no wheezing or rales, normal inspiratory and expiratory effort Chest wall: without tenderness to palpitation Heart: PMI not displaced, RRR, s1 s2 normal, 2/6 systolic murmur, crisp mechanical heart sounds; No diastolic murmur, no rubs, gallops, thrills, or heaves Abdomen: soft, nontender; no hepatosplenomehaly, BS+; abdominal aorta nontender and not dilated by palpation. Back: no CVA tenderness Pulses: 2+  Musculoskeletal: full range of motion, normal strength, no joint deformities Extremities: Pulses 2+, no clubbing cyanosis or edema, Homan's sign negative  Neurologic: grossly nonfocal; Cranial nerves grossly wnl Psychologic: Normal mood and affect  ECG (independently read by me): Normal sinus rhythm at 81 bpm.  Probable left atrial enlargement.  QS complex anteriorly.  Increased QTc interval.  Inferolateral T changes.  06/19/2014 ECG (independently read by me): Normal sinus rhythm at 77 bpm.  Nondiagnostic T changes in leads 1 aVL and V6.  March 2016 ECG (independently read by me): Normal sinus rhythm at 69 bpm.  Borderline first degree AV block with a PR interval 204 ms.  Inferolateral T wave abnormality.  November 2015 ECG (independently read by me): Normal sinus rhythm.  Borderline first-degree AV block with a PR interval of 202.  T abnormalities in leads 1 and L2 and V6.  LVH by voltage.  May 2015 ECG (independently read by me): Normal sinus rhythm at 72 beats per minute.  T wave changes in leads 1 and L. and V6.  QTC 486 ms.  LABS:  BMP Latest Ref Rng 06/17/2014 06/09/2014 05/20/2014  Glucose 70 - 99 mg/dL 196(H) 228(H) 118(H)  BUN 6 - 20 mg/dL 13 26(H) 53(H)  Creatinine 0.44 - 1.00 mg/dL 4.05(H) 6.48(H) 8.82(H)  Sodium 135 - 145 mmol/L 137 133(L) 132(L)  Potassium 3.5 - 5.1 mmol/L 3.1(L) 3.6 5.1  Chloride 101 - 111 mmol/L 95(L) 92(L) 95(L)  CO2 22 - 32 mmol/L _0 Calcium 8.9 - 10.3 mg/dL 8.3(L) 8.4 8.3(L)     Hepatic Function Latest Ref Rng 06/17/2014 06/09/2014 05/20/2014  Total Protein 6.5 - 8.1 g/dL 7.3 7.6 -  Albumin 3.5 - 5.0 g/dL 2.9(L) 3.1(L) 2.7(L)  AST 15 - 41 U/L 33 46(H) -  ALT 14 - 54 U/L 19 22 -  Alk Phosphatase 38 - 126 U/L 94 98 -  Total Bilirubin 0.3 - 1.2 mg/dL 0.8 0.6 -  Bilirubin, Direct 0.0 - 0.5 mg/dL - - -    CBC Latest Ref Rng 06/17/2014 06/09/2014 05/20/2014  WBC 4.0 - 10.5 K/uL 4.5 5.7 5.6  Hemoglobin 12.0 - 15.0 g/dL 11.9(L) 11.8(L) 9.4(L)  Hematocrit 36.0 - 46.0 % 36.7 36.4 29.3(L)  Platelets 150 - 400 K/uL 83(L) PLATELET CLUMPS NOTED ON SMEAR, COUNT APPEARS DECREASED 100(L)   Lab Results  Component Value Date   MCV 96.1 06/17/2014   MCV 96.6 06/09/2014   MCV 95.4 05/20/2014     BNP    Component Value Date/Time   PROBNP 33098.0* 01/02/2014 2318    Lipid Panel     Component Value Date/Time   CHOL 225* 12/22/2011 1030   TRIG 121  12/22/2011 1030   HDL 97 12/22/2011 1030   CHOLHDL 2.3 12/22/2011 1030   VLDL 24 12/22/2011 1030   LDLCALC 104* 12/22/2011 1030     RADIOLOGY: Dg Chest 2 View  06/01/2013   CLINICAL DATA:  History of aortic valve replacement in February, follow-up  EXAM: CHEST  2 VIEW  COMPARISON:  Chest x-ray 04/20/2013, and CT chest of 03/11/2013  FINDINGS: Cardiomegaly is stable. There is very mild pulmonary vascular congestion present. No effusion is seen. A nodular opacity in the medial left lung apex is stable and compared to the prior CT chest represents a bony density from the upper thoracic spine. Median sternotomy sutures remain and an aortic valve replacement is present. No acute bony abnormality is seen. Multiple surgical clips are present within the epigastrium.  IMPRESSION: 1. Stable cardiomegaly. 2. Mild pulmonary vascular congestion.   Electronically Signed   By: Ivar Drape M.D.   On: 06/01/2013 09:40      ASSESSMENT AND PLAN: Ms. Sharisa Toves is a 59 year old African-American female who underwent mechanical aortic valve  replacement for moderately severe symptomatic aortic stenosis and single-vessel LIMA to LAD CABG revascularization surgery in February 2015.  She had been readmitted with episodes of tachycardia and shortness of breath.   She had developed recurrent episode of SVT which responsive to adenosine.  I last saw her, I discontinued amlodipine and started her on Rapamune in attempt to further reduce episodes of SVT.  She has tolerated this well and is taking 120 mg at bedtime.  She also is on Lopressor 25 mg twice a day.  I reviewed her sleep study in detail with her.  She had significant increased respiratory effort related arousals and are RDI index was markedly elevated at 61.3.  She did not have significant oxygen desaturation as a result did not meet criteria for hypopnea leading to an AHI that is low.  Of note, she was noted to have 10 central apneas during the night.  It is possible on her nondialysis days, that she may have more volume overload which may be contributing to some of her central apneic events.  We will try to schedule her for a CPAP/probable BiPAP titration if insurance will allow this tickly with her significant cardiovascular comorbidities and extremely poor sleep.  I will see her in 3-4 months for follow-up evaluation.  Troy Sine, MD, [facility]  08/09/2014 10:10 PM

## 2014-08-10 DIAGNOSIS — T8640 Unspecified complication of liver transplant: Secondary | ICD-10-CM | POA: Diagnosis not present

## 2014-08-10 DIAGNOSIS — I359 Nonrheumatic aortic valve disorder, unspecified: Secondary | ICD-10-CM | POA: Diagnosis not present

## 2014-08-10 DIAGNOSIS — N186 End stage renal disease: Secondary | ICD-10-CM | POA: Diagnosis not present

## 2014-08-10 DIAGNOSIS — Z992 Dependence on renal dialysis: Secondary | ICD-10-CM | POA: Diagnosis not present

## 2014-08-10 LAB — PROTIME-INR: INR: 4.5 — AB (ref 0.9–1.1)

## 2014-08-11 ENCOUNTER — Ambulatory Visit (INDEPENDENT_AMBULATORY_CARE_PROVIDER_SITE_OTHER): Payer: Medicare Other | Admitting: Pharmacist Clinician (PhC)/ Clinical Pharmacy Specialist

## 2014-08-11 DIAGNOSIS — Z952 Presence of prosthetic heart valve: Secondary | ICD-10-CM

## 2014-08-11 DIAGNOSIS — Z7901 Long term (current) use of anticoagulants: Secondary | ICD-10-CM

## 2014-08-11 DIAGNOSIS — Z954 Presence of other heart-valve replacement: Secondary | ICD-10-CM

## 2014-08-12 DIAGNOSIS — N2581 Secondary hyperparathyroidism of renal origin: Secondary | ICD-10-CM | POA: Diagnosis not present

## 2014-08-12 DIAGNOSIS — D509 Iron deficiency anemia, unspecified: Secondary | ICD-10-CM | POA: Diagnosis not present

## 2014-08-12 DIAGNOSIS — E118 Type 2 diabetes mellitus with unspecified complications: Secondary | ICD-10-CM | POA: Diagnosis not present

## 2014-08-12 DIAGNOSIS — D631 Anemia in chronic kidney disease: Secondary | ICD-10-CM | POA: Diagnosis not present

## 2014-08-12 DIAGNOSIS — N186 End stage renal disease: Secondary | ICD-10-CM | POA: Diagnosis not present

## 2014-08-15 DIAGNOSIS — I359 Nonrheumatic aortic valve disorder, unspecified: Secondary | ICD-10-CM | POA: Diagnosis not present

## 2014-08-15 DIAGNOSIS — T82838A Hemorrhage of vascular prosthetic devices, implants and grafts, initial encounter: Secondary | ICD-10-CM | POA: Diagnosis not present

## 2014-08-15 DIAGNOSIS — R791 Abnormal coagulation profile: Secondary | ICD-10-CM | POA: Diagnosis not present

## 2014-08-15 DIAGNOSIS — M96810 Intraoperative hemorrhage and hematoma of a musculoskeletal structure complicating a musculoskeletal system procedure: Secondary | ICD-10-CM | POA: Diagnosis not present

## 2014-08-15 LAB — PROTIME-INR: INR: 5.5 — AB (ref <=1.1)

## 2014-08-16 ENCOUNTER — Ambulatory Visit (INDEPENDENT_AMBULATORY_CARE_PROVIDER_SITE_OTHER): Payer: Medicare Other | Admitting: Pharmacist Clinician (PhC)/ Clinical Pharmacy Specialist

## 2014-08-16 ENCOUNTER — Telehealth: Payer: Self-pay | Admitting: Cardiovascular Disease

## 2014-08-16 DIAGNOSIS — Z952 Presence of prosthetic heart valve: Secondary | ICD-10-CM

## 2014-08-16 DIAGNOSIS — Z954 Presence of other heart-valve replacement: Secondary | ICD-10-CM

## 2014-08-16 DIAGNOSIS — Z7901 Long term (current) use of anticoagulants: Secondary | ICD-10-CM

## 2014-08-16 NOTE — Telephone Encounter (Signed)
[name] called back an di informed her that she would need to call either her PCP or call the ER back since they put the dressing on

## 2014-08-16 NOTE — Telephone Encounter (Signed)
[name] is calling to find out if she should take the old gauges off because the blood broke thru or do she need to add more on to  keep the pressure on it. Her coumadin level is 5.5.Marland KitchenMarland Kitchen Please call  Thanks

## 2014-08-16 NOTE — Telephone Encounter (Signed)
Daughter called and stated they had gone to the ER and her inr was 5, I informed pt.s daughter not to give her any coumadin today and I would give this message to [place]

## 2014-08-22 DIAGNOSIS — I359 Nonrheumatic aortic valve disorder, unspecified: Secondary | ICD-10-CM | POA: Diagnosis not present

## 2014-08-22 LAB — PROTIME-INR: INR: 3.1 — AB (ref <=1.1)

## 2014-08-24 ENCOUNTER — Ambulatory Visit (INDEPENDENT_AMBULATORY_CARE_PROVIDER_SITE_OTHER): Payer: Medicare Other | Admitting: Pharmacist Clinician (PhC)/ Clinical Pharmacy Specialist

## 2014-08-24 DIAGNOSIS — Z954 Presence of other heart-valve replacement: Secondary | ICD-10-CM

## 2014-08-24 DIAGNOSIS — Z952 Presence of prosthetic heart valve: Secondary | ICD-10-CM

## 2014-08-24 DIAGNOSIS — Z7901 Long term (current) use of anticoagulants: Secondary | ICD-10-CM

## 2014-08-25 ENCOUNTER — Emergency Department (HOSPITAL_COMMUNITY)
Admission: EM | Admit: 2014-08-25 | Discharge: 2014-08-25 | Disposition: A | Discharge status: Home / Self Care | Payer: Medicare Other | Source: Home / Self Care | Attending: Emergency Medicine | Admitting: Emergency Medicine

## 2014-08-25 ENCOUNTER — Encounter (HOSPITAL_COMMUNITY): Payer: Self-pay | Admitting: Emergency Medicine

## 2014-08-25 ENCOUNTER — Emergency Department (HOSPITAL_COMMUNITY): Payer: Medicare Other

## 2014-08-25 DIAGNOSIS — Z7982 Long term (current) use of aspirin: Secondary | ICD-10-CM

## 2014-08-25 DIAGNOSIS — E039 Hypothyroidism, unspecified: Secondary | ICD-10-CM | POA: Diagnosis present

## 2014-08-25 DIAGNOSIS — Z992 Dependence on renal dialysis: Secondary | ICD-10-CM | POA: Insufficient documentation

## 2014-08-25 DIAGNOSIS — F17210 Nicotine dependence, cigarettes, uncomplicated: Secondary | ICD-10-CM | POA: Diagnosis present

## 2014-08-25 DIAGNOSIS — Z951 Presence of aortocoronary bypass graft: Secondary | ICD-10-CM

## 2014-08-25 DIAGNOSIS — K5900 Constipation, unspecified: Secondary | ICD-10-CM | POA: Insufficient documentation

## 2014-08-25 DIAGNOSIS — J9601 Acute respiratory failure with hypoxia: Secondary | ICD-10-CM | POA: Diagnosis not present

## 2014-08-25 DIAGNOSIS — Z944 Liver transplant status: Secondary | ICD-10-CM

## 2014-08-25 DIAGNOSIS — E119 Type 2 diabetes mellitus without complications: Secondary | ICD-10-CM

## 2014-08-25 DIAGNOSIS — J449 Chronic obstructive pulmonary disease, unspecified: Secondary | ICD-10-CM | POA: Diagnosis present

## 2014-08-25 DIAGNOSIS — G709 Myoneural disorder, unspecified: Secondary | ICD-10-CM | POA: Insufficient documentation

## 2014-08-25 DIAGNOSIS — I252 Old myocardial infarction: Secondary | ICD-10-CM | POA: Diagnosis not present

## 2014-08-25 DIAGNOSIS — Z8701 Personal history of pneumonia (recurrent): Secondary | ICD-10-CM

## 2014-08-25 DIAGNOSIS — Z954 Presence of other heart-valve replacement: Secondary | ICD-10-CM | POA: Insufficient documentation

## 2014-08-25 DIAGNOSIS — J45909 Unspecified asthma, uncomplicated: Secondary | ICD-10-CM | POA: Diagnosis present

## 2014-08-25 DIAGNOSIS — J441 Chronic obstructive pulmonary disease with (acute) exacerbation: Secondary | ICD-10-CM

## 2014-08-25 DIAGNOSIS — F419 Anxiety disorder, unspecified: Secondary | ICD-10-CM | POA: Insufficient documentation

## 2014-08-25 DIAGNOSIS — Z885 Allergy status to narcotic agent status: Secondary | ICD-10-CM | POA: Diagnosis not present

## 2014-08-25 DIAGNOSIS — R0902 Hypoxemia: Secondary | ICD-10-CM | POA: Diagnosis not present

## 2014-08-25 DIAGNOSIS — N186 End stage renal disease: Secondary | ICD-10-CM

## 2014-08-25 DIAGNOSIS — D649 Anemia, unspecified: Secondary | ICD-10-CM

## 2014-08-25 DIAGNOSIS — G8929 Other chronic pain: Secondary | ICD-10-CM | POA: Insufficient documentation

## 2014-08-25 DIAGNOSIS — K219 Gastro-esophageal reflux disease without esophagitis: Secondary | ICD-10-CM | POA: Diagnosis present

## 2014-08-25 DIAGNOSIS — I120 Hypertensive chronic kidney disease with stage 5 chronic kidney disease or end stage renal disease: Secondary | ICD-10-CM

## 2014-08-25 DIAGNOSIS — J9600 Acute respiratory failure, unspecified whether with hypoxia or hypercapnia: Secondary | ICD-10-CM | POA: Diagnosis present

## 2014-08-25 DIAGNOSIS — I739 Peripheral vascular disease, unspecified: Secondary | ICD-10-CM | POA: Diagnosis present

## 2014-08-25 DIAGNOSIS — I129 Hypertensive chronic kidney disease with stage 1 through stage 4 chronic kidney disease, or unspecified chronic kidney disease: Secondary | ICD-10-CM | POA: Diagnosis not present

## 2014-08-25 DIAGNOSIS — Z8619 Personal history of other infectious and parasitic diseases: Secondary | ICD-10-CM | POA: Insufficient documentation

## 2014-08-25 DIAGNOSIS — Z8673 Personal history of transient ischemic attack (TIA), and cerebral infarction without residual deficits: Secondary | ICD-10-CM | POA: Diagnosis not present

## 2014-08-25 DIAGNOSIS — I351 Nonrheumatic aortic (valve) insufficiency: Secondary | ICD-10-CM | POA: Diagnosis not present

## 2014-08-25 DIAGNOSIS — E1122 Type 2 diabetes mellitus with diabetic chronic kidney disease: Secondary | ICD-10-CM | POA: Diagnosis present

## 2014-08-25 DIAGNOSIS — Z79899 Other long term (current) drug therapy: Secondary | ICD-10-CM

## 2014-08-25 DIAGNOSIS — R0602 Shortness of breath: Secondary | ICD-10-CM | POA: Diagnosis not present

## 2014-08-25 DIAGNOSIS — Z952 Presence of prosthetic heart valve: Secondary | ICD-10-CM | POA: Diagnosis not present

## 2014-08-25 DIAGNOSIS — G43909 Migraine, unspecified, not intractable, without status migrainosus: Secondary | ICD-10-CM | POA: Insufficient documentation

## 2014-08-25 DIAGNOSIS — B1920 Unspecified viral hepatitis C without hepatic coma: Secondary | ICD-10-CM | POA: Diagnosis present

## 2014-08-25 DIAGNOSIS — E669 Obesity, unspecified: Secondary | ICD-10-CM

## 2014-08-25 DIAGNOSIS — Z7901 Long term (current) use of anticoagulants: Secondary | ICD-10-CM

## 2014-08-25 DIAGNOSIS — Z794 Long term (current) use of insulin: Secondary | ICD-10-CM

## 2014-08-25 DIAGNOSIS — F17200 Nicotine dependence, unspecified, uncomplicated: Secondary | ICD-10-CM | POA: Diagnosis not present

## 2014-08-25 DIAGNOSIS — R069 Unspecified abnormalities of breathing: Secondary | ICD-10-CM | POA: Diagnosis not present

## 2014-08-25 DIAGNOSIS — I517 Cardiomegaly: Secondary | ICD-10-CM | POA: Diagnosis not present

## 2014-08-25 DIAGNOSIS — I5023 Acute on chronic systolic (congestive) heart failure: Principal | ICD-10-CM | POA: Diagnosis present

## 2014-08-25 DIAGNOSIS — Z886 Allergy status to analgesic agent status: Secondary | ICD-10-CM

## 2014-08-25 DIAGNOSIS — N189 Chronic kidney disease, unspecified: Secondary | ICD-10-CM | POA: Diagnosis not present

## 2014-08-25 DIAGNOSIS — F329 Major depressive disorder, single episode, unspecified: Secondary | ICD-10-CM | POA: Insufficient documentation

## 2014-08-25 DIAGNOSIS — R05 Cough: Secondary | ICD-10-CM | POA: Diagnosis not present

## 2014-08-25 DIAGNOSIS — D631 Anemia in chronic kidney disease: Secondary | ICD-10-CM | POA: Diagnosis present

## 2014-08-25 DIAGNOSIS — Z940 Kidney transplant status: Secondary | ICD-10-CM | POA: Diagnosis not present

## 2014-08-25 DIAGNOSIS — I509 Heart failure, unspecified: Secondary | ICD-10-CM | POA: Diagnosis not present

## 2014-08-25 DIAGNOSIS — Z888 Allergy status to other drugs, medicaments and biological substances status: Secondary | ICD-10-CM

## 2014-08-25 DIAGNOSIS — M1990 Unspecified osteoarthritis, unspecified site: Secondary | ICD-10-CM | POA: Insufficient documentation

## 2014-08-25 DIAGNOSIS — I2510 Atherosclerotic heart disease of native coronary artery without angina pectoris: Secondary | ICD-10-CM

## 2014-08-25 DIAGNOSIS — Z720 Tobacco use: Secondary | ICD-10-CM | POA: Insufficient documentation

## 2014-08-25 DIAGNOSIS — E780 Pure hypercholesterolemia: Secondary | ICD-10-CM | POA: Diagnosis not present

## 2014-08-25 DIAGNOSIS — F0390 Unspecified dementia without behavioral disturbance: Secondary | ICD-10-CM | POA: Diagnosis not present

## 2014-08-25 LAB — BASIC METABOLIC PANEL WITH GFR
Anion gap: 14 (ref 5–15)
BUN: 17 mg/dL (ref 6–20)
CO2: 28 mmol/L (ref 22–32)
Calcium: 9.5 mg/dL (ref 8.9–10.3)
Chloride: 96 mmol/L — ABNORMAL LOW (ref 101–111)
Creatinine, Ser: 7.01 mg/dL — ABNORMAL HIGH (ref 0.44–1.00)
GFR calc Af Amer: 7 mL/min — ABNORMAL LOW
GFR calc non Af Amer: 6 mL/min — ABNORMAL LOW
Glucose, Bld: 139 mg/dL — ABNORMAL HIGH (ref 65–99)
Potassium: 3.9 mmol/L (ref 3.5–5.1)
Sodium: 138 mmol/L (ref 135–145)

## 2014-08-25 LAB — CBC
HEMATOCRIT: 37.6 % (ref 36.0–46.0)
Hemoglobin: 11.9 g/dL — ABNORMAL LOW (ref 12.0–15.0)
MCH: 31.1 pg (ref 26.0–34.0)
MCHC: 31.6 g/dL (ref 30.0–36.0)
MCV: 98.2 fL (ref 78.0–100.0)
Platelets: 142 10*3/uL — ABNORMAL LOW (ref 150–400)
RBC: 3.83 MIL/uL — ABNORMAL LOW (ref 3.87–5.11)
RDW: 16.5 % — AB (ref 11.5–15.5)
WBC: 5.9 10*3/uL (ref 4.0–10.5)

## 2014-08-25 LAB — I-STAT TROPONIN, ED: TROPONIN I, POC: 0.03 ng/mL (ref 0.00–0.08)

## 2014-08-25 LAB — BRAIN NATRIURETIC PEPTIDE: B NATRIURETIC PEPTIDE 5: 1999.3 pg/mL — AB (ref 0.0–100.0)

## 2014-08-25 MED ORDER — PREDNISONE 10 MG (21) PO TBPK: ORAL_TABLET | ORAL | Status: DC

## 2014-08-25 MED ORDER — BENZONATATE 100 MG PO CAPS: 100.0000 mg | ORAL_CAPSULE | Freq: Three times a day (TID) | ORAL | Status: DC

## 2014-08-25 MED ORDER — BENZONATATE 100 MG PO CAPS
100.0000 mg | ORAL_CAPSULE | Freq: Once | ORAL | Status: AC
  Administered 2014-08-25: 100 mg via ORAL
  Filled 2014-08-25: qty 1

## 2014-08-25 MED ORDER — IPRATROPIUM-ALBUTEROL 0.5-2.5 (3) MG/3ML IN SOLN
3.0000 mL | Freq: Once | RESPIRATORY_TRACT | Status: AC
  Administered 2014-08-25: 3 mL via RESPIRATORY_TRACT
  Filled 2014-08-25: qty 3

## 2014-08-25 NOTE — ED Notes (Signed)
[name], pts daughter wanted to leave her number for contact, [phone]

## 2014-08-25 NOTE — Discharge Instructions (Signed)
Chronic Obstructive Pulmonary Disease Chronic obstructive pulmonary disease (COPD) is a common lung problem. In COPD, the flow of air from the lungs is limited. The way your lungs work will probably never return to normal, but there are things you can do to improve your lungs and make yourself feel better. HOME CARE  Take all medicines as told by your doctor.  Avoid medicines or cough syrups that dry up your airway (such as antihistamines) and do not allow you to get rid of thick spit. You do not need to avoid them if told differently by your doctor.  If you smoke, stop. Smoking makes the problem worse.  Avoid being around things that make your breathing worse (like smoke, chemicals, and fumes).  Use oxygen therapy and therapy to help improve your lungs (pulmonary rehabilitation) if told by your doctor. If you need home oxygen therapy, ask your doctor if you should buy a tool to measure your oxygen level (oximeter).  Avoid people who have a sickness you can catch (contagious).  Avoid going outside when it is very hot, cold, or humid.  Eat healthy foods. Eat smaller meals more often. Rest before meals.  Stay active, but remember to also rest.  Make sure to get all the shots (vaccines) your doctor recommends. Ask your doctor if you need a pneumonia shot.  Learn and use tips on how to relax.  Learn and use tips on how to control your breathing as told by your doctor. Try:  Breathing in (inhaling) through your nose for 1 second. Then, pucker your lips and breath out (exhale) through your lips for 2 seconds.  Putting one hand on your belly (abdomen). Breathe in slowly through your nose for 1 second. Your hand on your belly should move out. Pucker your lips and breathe out slowly through your lips. Your hand on your belly should move in as you breathe out.  Learn and use controlled coughing to clear thick spit from your lungs. The steps are: 1. Lean your head a little forward. 2. Breathe  in deeply. 3. Try to hold your breath for 3 seconds. 4. Keep your mouth slightly open while coughing 2 times. 5. Spit any thick spit out into a tissue. 6. Rest and do the steps again 1 or 2 times as needed. GET HELP IF:  You cough up more thick spit than usual.  There is a change in the color or thickness of the spit.  It is harder to breathe than usual.  Your breathing is faster than usual. GET HELP RIGHT AWAY IF:   You have shortness of breath while resting.  You have shortness of breath that stops you from:  Being able to talk.  Doing normal activities.  You chest hurts for longer than 5 minutes.  Your skin color is more blue than usual.  Your pulse oximeter shows that you have low oxygen for longer than 5 minutes. MAKE SURE YOU:   Understand these instructions.  Will watch your condition.  Will get help right away if you are not doing well or get worse. Document Released: 07/16/2007 Document Revised: 06/13/2013 Document Reviewed: 09/23/2012 Exit[employer] Patient Information 2015 [employer]. This information is not intended to replace advice given to you by your health care provider. Make sure you discuss any questions you have with your health care provider.  

## 2014-08-25 NOTE — ED Provider Notes (Signed)
CSN: [id number]     Arrival date & time 08/25/14  1906 History   First MD Initiated Contact with Patient 08/25/14 2009     Chief Complaint  Patient presents with  . Shortness of Breath   Patient is a 59 y.o. female presenting with shortness of breath. The history is provided by the patient.  Shortness of Breath Severity:  Moderate Onset quality:  Gradual Duration:  4 days Timing:  Constant Progression:  Worsening Chronicity:  Recurrent Relieved by:  Nothing Worsened by:  Activity Ineffective treatments:  Inhaler Associated symptoms: cough (dry, hacking)   Associated symptoms: no abdominal pain, no fever and no vomiting     Past Medical History  Diagnosis Date  . 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 Chronic back pain   . CAD (coronary artery disease)   . Obesity   . Peripheral vascular disease hands and legs  . Anxiety   . Asthma   . GERD (gastroesophageal reflux disease)     takes Omeprazole daily  . Chronic constipation     takes MIralax and Colace daily  . Anemia     takes Folic Acid daily  . Hypothyroidism     takes Synthroid daily  . Depression     takes Cymbalta for "severe" depression  . Neuromuscular disorder     carpal tunnel in right hand  . Hypertension     takes Metoprolol and Lisinopril daily, sees Dr Bea Graff  . COPD (chronic obstructive pulmonary disease)   . Pneumonia     "today and several times before" (08/30/2012)  . Chronic bronchitis     "q yr w/season changes" (08/30/2012)  . Type II diabetes mellitus     Levemir 2units daily if > 150  . History of blood transfusion     "several" (08/30/2012)  . Hepatitis C   . Migraine     "last migraine was in 2013" (08/30/2012)  . Headache     "at least monthly" (08/30/2012)  . Arthritis     "left hand, back" (08/30/2012)  . End stage renal disease on dialysis 02/27/2011    Kidneys shut down at time of liver transplant in Sept  2011 at [facility] in [place], she has been on HD ever since.  Dialyzes at [facility] HD on TTS schedule.  Had L forearm graft used 10 months then removed Dec 2012 due to suspected infection.  A right upper arm AVG was placed Dec 2012 but she developed steal symptoms acutely and it was ligated the same day.  Never had an AV fistula due to small veins.  Now has L thigh AVG put in Jan 2013, has not clotted to date.    [name] CAD (coronary artery disease) Jan. 2015    Cath: 20% LAD, 50% D1; s/p LIMA-LAD  . Aortic stenosis   . Tobacco abuse   . S/P aortic valve replacement 03/15/13    Mechanical   . SVT (supraventricular tachycardia) 06/09/14   Past Surgical History  Procedure Laterality Date  . Liver transplant  10/25/2009    sees Dr Ferol Luz 1 every 6 months, saw last in Dec 2013. [name] Coord [phone]  . Small intestine surgery  90's  . Thrombectomy    . Arteriovenous graft placement Left 10/03/10     forearm  . Avgg removal  12/23/2010    Procedure: REMOVAL OF ARTERIOVENOUS GORETEX GRAFT (  [place]);  Surgeon: Elam Dutch, MD;  Location: [place];  Service: Vascular;  Laterality: Left;  procedure started @1736 -1852  . Insertion of dialysis catheter  12/23/2010    Procedure: INSERTION OF DIALYSIS CATHETER;  Surgeon: Elam Dutch, MD;  Location: [place];  Service: Vascular;  Laterality: Right;  Right Internal Jugular 28cm dialysis catheter insertion procedure time 1701-1720   . Cholecystectomy  1993  . Cystoscopy  1990's  . Spinal growth rods  2010    "put 2 metal rods in my back; they had detetriorated" (08/30/2012)  . Av fistula placement  01/29/2011    Procedure: INSERTION OF ARTERIOVENOUS (AV) GORE-TEX GRAFT ARM;  Surgeon: Elam Dutch, MD;  Location: [place];  Service: Vascular;  Laterality: Right;  . Av fistula placement  03/10/2011    Procedure: INSERTION OF ARTERIOVENOUS (AV) GORE-TEX GRAFT THIGH;  Surgeon: Elam Dutch, MD;  Location: [place];  Service: Vascular;  Laterality: Left;   . Tubal ligation  1990's  . Aortic valve replacement N/A 03/15/2013    AVR; Surgeon: Ivin Poot, MD;  Location: [facility] OR; Open Heart Surgery;  84mm[employer] mechanical prosthesis, top hat valve  . Coronary artery bypass graft N/A 03/15/2013    Procedure: CORONARY ARTERY BYPASS GRAFTING (CABG) times one using left internal mammary artery.;  Surgeon: Ivin Poot, MD;  Location: [place];  Service: Open Heart Surgery;  Laterality: N/A;  POSS CABG X 1  . Intraoperative transesophageal echocardiogram N/A 03/15/2013    Procedure: INTRAOPERATIVE TRANSESOPHAGEAL ECHOCARDIOGRAM;  Surgeon: Ivin Poot, MD;  Location: [place];  Service: Open Heart Surgery;  Laterality: N/A;  . Left heart catheterization with coronary/graft angiogram N/A 12/24/2011    Procedure: LEFT HEART CATHETERIZATION WITH Beatrix Fetters;  Surgeon: [name], MD;  Location: [facility] CATH LAB;  Service: Cardiovascular;  Laterality: N/A;  . Left heart catheterization with coronary angiogram N/A 07/29/2012    Procedure: LEFT HEART CATHETERIZATION WITH CORONARY ANGIOGRAM;  Surgeon: Troy Sine, MD;  Location: [facility] CATH LAB;  Service: Cardiovascular;  Laterality: N/A;  . Left heart catheterization with coronary angiogram N/A 03/10/2013    Procedure: LEFT HEART CATHETERIZATION WITH CORONARY ANGIOGRAM;  Surgeon: Troy Sine, MD;  Location: [facility] CATH LAB;  Service: Cardiovascular;  Laterality: N/A;  . Left heart catheterization with coronary/graft angiogram N/A 12/16/2013    Procedure: LEFT HEART CATHETERIZATION WITH Beatrix Fetters;  Surgeon: Troy Sine, MD;  Location: [facility] CATH LAB;  Service: Cardiovascular;  Laterality: N/A;  . Thrombectomy and revision of arterioventous (av) goretex  graft Left 03/30/2014  . Thrombectomy and revision of arterioventous (av) goretex  graft Left 03/30/2014    Procedure: THROMBECTOMY AND REVISION OF ARTERIOVENTOUS (AV) GORETEX  GRAFT;  Surgeon: Conrad [place], MD;  Location: [place];  Service:  Vascular;  Laterality: Left;  . Shuntogram Left 05/15/2014    Procedure: SHUNTOGRAM;  Surgeon: Conrad [place], MD;  Location: [facility] CATH LAB;  Service: Cardiovascular;  Laterality: Left;   Family History  Problem Relation Age of Onset  . Cancer Mother   . Diabetes Mother   . Hypertension Mother   . Stroke Mother   . Cancer Father   . Anesthesia problems Neg Hx   . Hypotension Neg Hx   . Malignant hyperthermia Neg Hx   . Pseudochol deficiency Neg Hx    History  Substance Use Topics  . Smoking status: Current Some Day Smoker -- 0.75 packs/day for 40 years    Types: Cigarettes  .  Smokeless tobacco: Never Used     Comment: still smokng 1 or 2 a day  . Alcohol Use: Yes   OB History    No data available     Review of Systems  Constitutional: Negative for fever.  Respiratory: Positive for cough (dry, hacking) and shortness of breath.   Gastrointestinal: Negative for vomiting and abdominal pain.  All other systems reviewed and are negative.     Allergies  Acetaminophen; Codeine; Mirtazapine; and Morphine  Home Medications   Prior to Admission medications   Medication Sig Start Date End Date Taking? Authorizing Provider  albuterol (PROVENTIL,VENTOLIN) 90 MCG/ACT inhaler Inhale 1-2 puffs into the lungs every 6 (six) hours as needed for wheezing or shortness of breath.     Historical Provider, MD  aspirin EC 81 MG tablet Take 81 mg by mouth daily. Was given 4 tablets tonight    Historical Provider, MD  benzonatate (TESSALON) 100 MG capsule Take 1 capsule (100 mg total) by mouth every 8 (eight) hours. 08/25/14   Dorie Rank, MD  calcium acetate (PHOSLO) 667 MG capsule Take 667-2,001 mg by mouth See admin instructions. Take 3 capsules (2001 mg) with meals and 1 capsule (667 mg) with snacks    Historical Provider, MD  cinacalcet (SENSIPAR) 30 MG tablet Take 30 mg by mouth daily.    Historical Provider, MD  gabapentin (NEURONTIN) 300 MG capsule Take 300 mg by mouth at bedtime.     Historical  Provider, MD  insulin aspart (NOVOLOG) 100 UNIT/ML injection Inject 1-3 Units into the skin 3 (three) times daily as needed for high blood sugar (CBG>150). Per sliding scale    Historical Provider, MD  levothyroxine (SYNTHROID, LEVOTHROID) 150 MCG tablet Take 150 mcg by mouth daily before breakfast.    Historical Provider, MD  metoprolol tartrate (LOPRESSOR) 25 MG tablet Take 25 mg by mouth 2 (two) times daily.  03/16/14   Historical Provider, MD  nitroGLYCERIN (NITROSTAT) 0.4 MG SL tablet Place 1 tablet (0.4 mg total) under the tongue every 5 (five) minutes as needed for chest pain. 03/20/14   Silver Huguenin Elgergawy, MD  ondansetron (ZOFRAN-ODT) 4 MG disintegrating tablet Take 4 mg by mouth every 8 (eight) hours as needed for nausea or vomiting.    Historical Provider, MD  predniSONE (STERAPRED UNI-PAK 21 TAB) 10 MG (21) TBPK tablet Take 6 tabs by mouth daily  for 2 days, then 5 tabs for 2 days, then 4 tabs for 2 days, then 3 tabs for 2 days, 2 tabs for 2 days, then 1 tab by mouth daily for 2 days 08/25/14   Dorie Rank, MD  [facility] HFA 108 (90 BASE) MCG/ACT inhaler  05/22/14   Historical Provider, MD  tacrolimus (PROGRAF) 1 MG capsule Take 3 mg by mouth 2 (two) times daily.     Historical Provider, MD  traMADol (ULTRAM) 50 MG tablet Take 1 tablet (50 mg total) by mouth every 12 (twelve) hours as needed for moderate pain. 05/20/14   Alvia Grove, PA-C  verapamil (CALAN) 120 MG tablet Take 1 tablet (120 mg total) by mouth at bedtime. 06/19/14   Troy Sine, MD  warfarin (COUMADIN) 2 MG tablet Take 2 tablets by mouth daily or as directed by coumadin clinic 06/01/14   Troy Sine, MD  warfarin (COUMADIN) 4 MG tablet Take 1 tablet (4 mg total) by mouth daily at 6 PM. Patient taking differently: Take 2-4 mg by mouth daily at 6 PM. 4mg  every day except Friday.  Only 2mg  on Fridays. 05/20/14   Joelene Millin A Trinh, PA-C   BP 183/60 mmHg  Pulse 89  Temp(Src) 98.6 F (37 C) (Oral)  Resp 22  SpO2 89% Physical Exam   Constitutional: She appears well-developed and well-nourished. No distress.  HENT:  Head: Normocephalic and atraumatic.  Right Ear: External ear normal.  Left Ear: External ear normal.  Eyes: Conjunctivae are normal. Right eye exhibits no discharge. Left eye exhibits no discharge. No scleral icterus.  Neck: Neck supple. No tracheal deviation present.  Cardiovascular: Normal rate, regular rhythm and intact distal pulses.   Pulmonary/Chest: Effort normal. No stridor. No respiratory distress. She has no wheezes. She has rales (bases).  Frequent coughing  Abdominal: Soft. Bowel sounds are normal. She exhibits no distension. There is no tenderness. There is no rebound and no guarding.  Musculoskeletal: She exhibits no edema or tenderness.  Neurological: She is alert. She has normal strength. No cranial nerve deficit (no facial droop, extraocular movements intact, no slurred speech) or sensory deficit. She exhibits normal muscle tone. She displays no seizure activity. Coordination normal.  Skin: Skin is warm and dry. No rash noted.  Psychiatric: She has a normal mood and affect.  Nursing note and vitals reviewed.   ED Course  Procedures (including critical care time) Labs Review Labs Reviewed  BASIC METABOLIC PANEL - Abnormal; Notable for the following:    Chloride 96 (*)    Glucose, Bld 139 (*)    Creatinine, Ser 7.01 (*)    GFR calc non Af Amer 6 (*)    GFR calc Af Amer 7 (*)    All other components within normal limits  CBC - Abnormal; Notable for the following:    RBC 3.83 (*)    Hemoglobin 11.9 (*)    RDW 16.5 (*)    Platelets 142 (*)    All other components within normal limits  BRAIN NATRIURETIC PEPTIDE - Abnormal; Notable for the following:    B Natriuretic Peptide 1999.3 (*)    All other components within normal limits  I-STAT TROPOININ, ED    Imaging Review Dg Chest 2 View  08/25/2014   CLINICAL DATA:  Shortness of breath and cough  EXAM: CHEST  2 VIEW  COMPARISON:   06/09/2014  FINDINGS: Previous median sternotomy and CABG procedure. Mild cardiac enlargement. There are bilateral pleural effusions and interstitial edema consistent with CHF. The visualized osseous structures are negative.  IMPRESSION: 1. Moderate congestive heart failure.   Electronically Signed   By: Kerby Moors M.D.   On: 08/25/2014 20:28     EKG Interpretation   Date/Time:  Friday August 25 2014 19:24:50 EDT Ventricular Rate:  78 PR Interval:  180 QRS Duration: 86 QT Interval:  406 QTC Calculation: 462 R Axis:     Text Interpretation:  Normal sinus rhythm Septal infarct , age  undetermined T wave abnormality, consider lateral ischemia Abnormal ECG No  significant change since last tracing Confirmed by Dreyah Montrose  MD-J, Cote Mayabb  ([id number]) on 08/25/2014 8:58:48 PM     Medications  benzonatate (TESSALON) capsule 100 mg (100 mg Oral Given 08/25/14 2112)  ipratropium-albuterol (DUONEB) 0.5-2.5 (3) MG/3ML nebulizer solution 3 mL (3 mLs Nebulization Given 08/25/14 2112)    MDM   Final diagnoses:  Chronic obstructive pulmonary disease with acute exacerbation   She felt much better after she was given a breathing treatment as well as Tessalon. I selected symptoms may be related to bronchitis and her coughing. Chest x-ray does show moderate  congestive heart failure but the patient is breathing easily, she is scheduled for dialysis tomorrow. Sats have remained in the mid 90s without any oxygen supplementation.    Dorie Rank, MD 08/25/14 2230

## 2014-08-25 NOTE — ED Notes (Signed)
Pt states sob that began Monday, took her last dialysis yesterday but states she is still feeling sob. Pt denies pain, alert, oriented.

## 2014-08-25 NOTE — ED Notes (Addendum)
Pt. reports SOB , dry cough and chest congestion onset this week , denies fever or chills , no chest pain , history of CHF/COPD . Hemodialysis q Tues/Thurs/Sat.

## 2014-08-25 NOTE — ED Notes (Signed)
Dr. Tomi Bamberger advised patient does not have to be on cardiac monitor at this time.

## 2014-08-25 NOTE — ED Notes (Signed)
MD at bedside. 

## 2014-08-27 ENCOUNTER — Telehealth: Payer: Self-pay | Admitting: *Deleted

## 2014-08-27 ENCOUNTER — Encounter: Payer: Self-pay | Admitting: *Deleted

## 2014-08-27 NOTE — Care Management Note (Signed)
Received referral from EDP to [facility] with f/u for this 59 yo F who has 8 ED visits since January of this year, 4 of these resulted in hospital admissions. Chronic health issues include but are not limited to Hep C, DM2, Chronic Anemia, HTN, ESRD on HD T,TR,S, COPD, CHF, Chronic Anticoagulation.Marland KitchenMarland KitchenWas hoping to have this pt seen in TCC but cannot as she is on HD. Will continue to follow.

## 2014-08-27 NOTE — Telephone Encounter (Addendum)
Referred pt info to [facility] via In basket mail at the request of Tomi Bamberger, MD for Consult: ECG, Lab, Medications, Nursing, Point of Care Testing-Docked Device, and Respiratory Care. (COPD Clinic follow up). Originally referred to [facility] but this pt receives HD. Will continue to follow

## 2014-08-28 ENCOUNTER — Encounter (HOSPITAL_COMMUNITY): Payer: Self-pay | Admitting: *Deleted

## 2014-08-28 ENCOUNTER — Inpatient Hospital Stay (HOSPITAL_COMMUNITY)
Admission: AD | Admit: 2014-08-28 | Discharge: 2014-08-30 | DRG: 291 | Disposition: A | Discharge status: Home Health | Payer: Medicare Other | Source: Other Acute Inpatient Hospital | Attending: Internal Medicine | Admitting: Internal Medicine

## 2014-08-28 ENCOUNTER — Telehealth: Payer: Self-pay

## 2014-08-28 DIAGNOSIS — J45909 Unspecified asthma, uncomplicated: Secondary | ICD-10-CM | POA: Diagnosis present

## 2014-08-28 DIAGNOSIS — Z8673 Personal history of transient ischemic attack (TIA), and cerebral infarction without residual deficits: Secondary | ICD-10-CM | POA: Diagnosis not present

## 2014-08-28 DIAGNOSIS — F419 Anxiety disorder, unspecified: Secondary | ICD-10-CM | POA: Diagnosis present

## 2014-08-28 DIAGNOSIS — E1122 Type 2 diabetes mellitus with diabetic chronic kidney disease: Secondary | ICD-10-CM | POA: Diagnosis present

## 2014-08-28 DIAGNOSIS — K5900 Constipation, unspecified: Secondary | ICD-10-CM | POA: Diagnosis present

## 2014-08-28 DIAGNOSIS — I509 Heart failure, unspecified: Secondary | ICD-10-CM | POA: Diagnosis not present

## 2014-08-28 DIAGNOSIS — G8929 Other chronic pain: Secondary | ICD-10-CM | POA: Diagnosis present

## 2014-08-28 DIAGNOSIS — N189 Chronic kidney disease, unspecified: Secondary | ICD-10-CM | POA: Diagnosis not present

## 2014-08-28 DIAGNOSIS — Z951 Presence of aortocoronary bypass graft: Secondary | ICD-10-CM | POA: Diagnosis not present

## 2014-08-28 DIAGNOSIS — I2510 Atherosclerotic heart disease of native coronary artery without angina pectoris: Secondary | ICD-10-CM | POA: Diagnosis present

## 2014-08-28 DIAGNOSIS — R0602 Shortness of breath: Secondary | ICD-10-CM | POA: Diagnosis not present

## 2014-08-28 DIAGNOSIS — Z992 Dependence on renal dialysis: Secondary | ICD-10-CM | POA: Diagnosis not present

## 2014-08-28 DIAGNOSIS — F17210 Nicotine dependence, cigarettes, uncomplicated: Secondary | ICD-10-CM | POA: Diagnosis present

## 2014-08-28 DIAGNOSIS — J9621 Acute and chronic respiratory failure with hypoxia: Secondary | ICD-10-CM | POA: Diagnosis present

## 2014-08-28 DIAGNOSIS — I120 Hypertensive chronic kidney disease with stage 5 chronic kidney disease or end stage renal disease: Secondary | ICD-10-CM | POA: Diagnosis not present

## 2014-08-28 DIAGNOSIS — F0390 Unspecified dementia without behavioral disturbance: Secondary | ICD-10-CM | POA: Diagnosis not present

## 2014-08-28 DIAGNOSIS — Z79899 Other long term (current) drug therapy: Secondary | ICD-10-CM | POA: Diagnosis not present

## 2014-08-28 DIAGNOSIS — Z7901 Long term (current) use of anticoagulants: Secondary | ICD-10-CM | POA: Diagnosis not present

## 2014-08-28 DIAGNOSIS — R0902 Hypoxemia: Secondary | ICD-10-CM | POA: Diagnosis not present

## 2014-08-28 DIAGNOSIS — J441 Chronic obstructive pulmonary disease with (acute) exacerbation: Secondary | ICD-10-CM

## 2014-08-28 DIAGNOSIS — J9601 Acute respiratory failure with hypoxia: Secondary | ICD-10-CM | POA: Diagnosis not present

## 2014-08-28 DIAGNOSIS — B1920 Unspecified viral hepatitis C without hepatic coma: Secondary | ICD-10-CM | POA: Diagnosis present

## 2014-08-28 DIAGNOSIS — Z794 Long term (current) use of insulin: Secondary | ICD-10-CM | POA: Diagnosis not present

## 2014-08-28 DIAGNOSIS — E119 Type 2 diabetes mellitus without complications: Secondary | ICD-10-CM | POA: Diagnosis not present

## 2014-08-28 DIAGNOSIS — E669 Obesity, unspecified: Secondary | ICD-10-CM | POA: Diagnosis present

## 2014-08-28 DIAGNOSIS — D631 Anemia in chronic kidney disease: Secondary | ICD-10-CM | POA: Diagnosis not present

## 2014-08-28 DIAGNOSIS — I351 Nonrheumatic aortic (valve) insufficiency: Secondary | ICD-10-CM | POA: Diagnosis not present

## 2014-08-28 DIAGNOSIS — E780 Pure hypercholesterolemia: Secondary | ICD-10-CM | POA: Diagnosis not present

## 2014-08-28 DIAGNOSIS — Z885 Allergy status to narcotic agent status: Secondary | ICD-10-CM | POA: Diagnosis not present

## 2014-08-28 DIAGNOSIS — Z944 Liver transplant status: Secondary | ICD-10-CM | POA: Diagnosis not present

## 2014-08-28 DIAGNOSIS — K219 Gastro-esophageal reflux disease without esophagitis: Secondary | ICD-10-CM | POA: Diagnosis present

## 2014-08-28 DIAGNOSIS — J449 Chronic obstructive pulmonary disease, unspecified: Secondary | ICD-10-CM | POA: Diagnosis present

## 2014-08-28 DIAGNOSIS — E039 Hypothyroidism, unspecified: Secondary | ICD-10-CM | POA: Diagnosis present

## 2014-08-28 DIAGNOSIS — I252 Old myocardial infarction: Secondary | ICD-10-CM | POA: Diagnosis not present

## 2014-08-28 DIAGNOSIS — Z886 Allergy status to analgesic agent status: Secondary | ICD-10-CM | POA: Diagnosis not present

## 2014-08-28 DIAGNOSIS — Z940 Kidney transplant status: Secondary | ICD-10-CM | POA: Diagnosis not present

## 2014-08-28 DIAGNOSIS — I5023 Acute on chronic systolic (congestive) heart failure: Secondary | ICD-10-CM | POA: Diagnosis not present

## 2014-08-28 DIAGNOSIS — I129 Hypertensive chronic kidney disease with stage 1 through stage 4 chronic kidney disease, or unspecified chronic kidney disease: Secondary | ICD-10-CM | POA: Diagnosis not present

## 2014-08-28 DIAGNOSIS — N186 End stage renal disease: Secondary | ICD-10-CM | POA: Diagnosis not present

## 2014-08-28 DIAGNOSIS — Z7982 Long term (current) use of aspirin: Secondary | ICD-10-CM | POA: Diagnosis not present

## 2014-08-28 DIAGNOSIS — I517 Cardiomegaly: Secondary | ICD-10-CM | POA: Diagnosis not present

## 2014-08-28 DIAGNOSIS — R069 Unspecified abnormalities of breathing: Secondary | ICD-10-CM | POA: Diagnosis not present

## 2014-08-28 DIAGNOSIS — I739 Peripheral vascular disease, unspecified: Secondary | ICD-10-CM | POA: Diagnosis present

## 2014-08-28 DIAGNOSIS — J9600 Acute respiratory failure, unspecified whether with hypoxia or hypercapnia: Secondary | ICD-10-CM | POA: Diagnosis present

## 2014-08-28 DIAGNOSIS — F329 Major depressive disorder, single episode, unspecified: Secondary | ICD-10-CM | POA: Diagnosis present

## 2014-08-28 DIAGNOSIS — Z888 Allergy status to other drugs, medicaments and biological substances status: Secondary | ICD-10-CM | POA: Diagnosis not present

## 2014-08-28 DIAGNOSIS — Z952 Presence of prosthetic heart valve: Secondary | ICD-10-CM | POA: Diagnosis not present

## 2014-08-28 LAB — RENAL FUNCTION PANEL
ANION GAP: 14 (ref 5–15)
Albumin: 3 g/dL — ABNORMAL LOW (ref 3.5–5.0)
BUN: 20 mg/dL (ref 6–20)
CO2: 27 mmol/L (ref 22–32)
CREATININE: 8.67 mg/dL — AB (ref 0.44–1.00)
Calcium: 9.2 mg/dL (ref 8.9–10.3)
Chloride: 97 mmol/L — ABNORMAL LOW (ref 101–111)
GFR calc Af Amer: 5 mL/min — ABNORMAL LOW (ref >60)
GFR, EST NON AFRICAN AMERICAN: 4 mL/min — AB (ref >60)
Glucose, Bld: 303 mg/dL — ABNORMAL HIGH (ref 65–99)
PHOSPHORUS: 6.2 mg/dL — AB (ref 2.5–4.6)
Potassium: 3.7 mmol/L (ref 3.5–5.1)
SODIUM: 138 mmol/L (ref 135–145)

## 2014-08-28 LAB — CBC
HCT: 34.4 % — ABNORMAL LOW (ref 36.0–46.0)
Hemoglobin: 11.1 g/dL — ABNORMAL LOW (ref 12.0–15.0)
MCH: 31.5 pg (ref 26.0–34.0)
MCHC: 32.3 g/dL (ref 30.0–36.0)
MCV: 97.7 fL (ref 78.0–100.0)
Platelets: 122 10*3/uL — ABNORMAL LOW (ref 150–400)
RBC: 3.52 MIL/uL — ABNORMAL LOW (ref 3.87–5.11)
RDW: 17.1 % — ABNORMAL HIGH (ref 11.5–15.5)
WBC: 6.1 10*3/uL (ref 4.0–10.5)

## 2014-08-28 LAB — GLUCOSE, CAPILLARY
GLUCOSE-CAPILLARY: 204 mg/dL — AB (ref 65–99)
GLUCOSE-CAPILLARY: 196 mg/dL — AB (ref 65–99)

## 2014-08-28 LAB — PROTIME-INR
INR: 4.1 — ABNORMAL HIGH (ref 0.00–1.49)
Prothrombin Time: 38.7 seconds — ABNORMAL HIGH (ref 11.6–15.2)

## 2014-08-28 MED ORDER — ACETAMINOPHEN 325 MG PO TABS: 650.0000 mg | ORAL_TABLET | ORAL | Status: DC | PRN

## 2014-08-28 MED ORDER — LIDOCAINE-PRILOCAINE 2.5-2.5 % EX CREA: 1.0000 "application " | TOPICAL_CREAM | CUTANEOUS | Status: DC | PRN

## 2014-08-28 MED ORDER — HEPARIN SODIUM (PORCINE) 1000 UNIT/ML DIALYSIS: 1000.0000 [IU] | INTRAMUSCULAR | Status: DC | PRN

## 2014-08-28 MED ORDER — CALCITRIOL 0.5 MCG PO CAPS
1.0000 ug | ORAL_CAPSULE | ORAL | Status: DC
  Administered 2014-08-29: 1 ug via ORAL
  Filled 2014-08-28: qty 2

## 2014-08-28 MED ORDER — SODIUM CHLORIDE 0.9 % IV SOLN
100.0000 mL | INTRAVENOUS | Status: DC | PRN
Start: 2014-08-28 — End: 2014-08-28

## 2014-08-28 MED ORDER — LIDOCAINE HCL (PF) 1 % IJ SOLN: 5.0000 mL | INTRAMUSCULAR | Status: DC | PRN

## 2014-08-28 MED ORDER — ALBUTEROL 90 MCG/ACT IN AERS: 1.0000 | INHALATION_SPRAY | Freq: Four times a day (QID) | RESPIRATORY_TRACT | Status: DC | PRN

## 2014-08-28 MED ORDER — ALBUTEROL SULFATE (2.5 MG/3ML) 0.083% IN NEBU: 2.5000 mg | INHALATION_SOLUTION | RESPIRATORY_TRACT | Status: DC | PRN

## 2014-08-28 MED ORDER — CINACALCET HCL 30 MG PO TABS
30.0000 mg | ORAL_TABLET | Freq: Every day | ORAL | Status: DC
  Administered 2014-08-29: 30 mg via ORAL
  Filled 2014-08-28 (×2): qty 1

## 2014-08-28 MED ORDER — TACROLIMUS 1 MG PO CAPS
3.0000 mg | ORAL_CAPSULE | Freq: Two times a day (BID) | ORAL | Status: DC
  Administered 2014-08-28 – 2014-08-30 (×3): 3 mg via ORAL
  Filled 2014-08-28 (×5): qty 3

## 2014-08-28 MED ORDER — ALTEPLASE 2 MG IJ SOLR: 2.0000 mg | Freq: Once | INTRAMUSCULAR | Status: DC | PRN

## 2014-08-28 MED ORDER — SODIUM CHLORIDE 0.9 % IV SOLN: 250.0000 mL | INTRAVENOUS | Status: DC | PRN

## 2014-08-28 MED ORDER — LEVOTHYROXINE SODIUM 150 MCG PO TABS
150.0000 ug | ORAL_TABLET | Freq: Every day | ORAL | Status: DC
  Administered 2014-08-30: 150 ug via ORAL
  Filled 2014-08-28 (×3): qty 1

## 2014-08-28 MED ORDER — GABAPENTIN 300 MG PO CAPS
300.0000 mg | ORAL_CAPSULE | Freq: Every day | ORAL | Status: DC
  Administered 2014-08-28 – 2014-08-29 (×2): 300 mg via ORAL
  Filled 2014-08-28 (×3): qty 1

## 2014-08-28 MED ORDER — NEPRO/CARBSTEADY PO LIQD
237.0000 mL | ORAL | Status: DC | PRN
Start: 2014-08-28 — End: 2014-08-28

## 2014-08-28 MED ORDER — SODIUM CHLORIDE 0.9 % IJ SOLN
3.0000 mL | Freq: Two times a day (BID) | INTRAMUSCULAR | Status: DC
  Administered 2014-08-28 – 2014-08-29 (×2): 3 mL via INTRAVENOUS

## 2014-08-28 MED ORDER — VERAPAMIL HCL 120 MG PO TABS
120.0000 mg | ORAL_TABLET | Freq: Every day | ORAL | Status: DC
  Administered 2014-08-28 – 2014-08-29 (×2): 120 mg via ORAL
  Filled 2014-08-28 (×3): qty 1

## 2014-08-28 MED ORDER — ONDANSETRON 4 MG PO TBDP: 4.0000 mg | ORAL_TABLET | Freq: Three times a day (TID) | ORAL | Status: DC | PRN

## 2014-08-28 MED ORDER — CALCIUM ACETATE (PHOS BINDER) 667 MG PO CAPS
1334.0000 mg | ORAL_CAPSULE | Freq: Every day | ORAL | Status: DC
  Administered 2014-08-29: 1334 mg via ORAL
  Filled 2014-08-28 (×2): qty 2

## 2014-08-28 MED ORDER — CALCIUM ACETATE 667 MG PO CAPS
667.0000 mg | ORAL_CAPSULE | Freq: Two times a day (BID) | ORAL | Status: DC
  Administered 2014-08-29 – 2014-08-30 (×2): 667 mg via ORAL
  Filled 2014-08-28 (×7): qty 1

## 2014-08-28 MED ORDER — BENZONATATE 100 MG PO CAPS
100.0000 mg | ORAL_CAPSULE | Freq: Three times a day (TID) | ORAL | Status: DC
Start: 2014-08-28 — End: 2014-08-30
  Administered 2014-08-29 – 2014-08-30 (×3): 100 mg via ORAL
  Filled 2014-08-28 (×7): qty 1

## 2014-08-28 MED ORDER — TRAMADOL HCL 50 MG PO TABS: 50.0000 mg | ORAL_TABLET | Freq: Two times a day (BID) | ORAL | Status: DC | PRN

## 2014-08-28 MED ORDER — ASPIRIN EC 81 MG PO TBEC
81.0000 mg | DELAYED_RELEASE_TABLET | Freq: Every day | ORAL | Status: DC
  Administered 2014-08-29 – 2014-08-30 (×2): 81 mg via ORAL
  Filled 2014-08-28 (×3): qty 1

## 2014-08-28 MED ORDER — CALCIUM ACETATE 667 MG PO CAPS: 667.0000 mg | ORAL_CAPSULE | ORAL | Status: DC

## 2014-08-28 MED ORDER — SODIUM CHLORIDE 0.9 % IJ SOLN
3.0000 mL | INTRAMUSCULAR | Status: DC | PRN
  Administered 2014-08-29: 3 mL via INTRAVENOUS
  Filled 2014-08-28: qty 3

## 2014-08-28 MED ORDER — ONDANSETRON HCL 4 MG/2ML IJ SOLN: 4.0000 mg | Freq: Four times a day (QID) | INTRAMUSCULAR | Status: DC | PRN

## 2014-08-28 MED ORDER — PENTAFLUOROPROP-TETRAFLUOROETH EX AERO: 1.0000 "application " | INHALATION_SPRAY | CUTANEOUS | Status: DC | PRN

## 2014-08-28 MED ORDER — METOPROLOL TARTRATE 25 MG PO TABS
25.0000 mg | ORAL_TABLET | Freq: Two times a day (BID) | ORAL | Status: DC
  Administered 2014-08-28 – 2014-08-30 (×3): 25 mg via ORAL
  Filled 2014-08-28 (×5): qty 1

## 2014-08-28 MED ORDER — IPRATROPIUM-ALBUTEROL 0.5-2.5 (3) MG/3ML IN SOLN
3.0000 mL | Freq: Four times a day (QID) | RESPIRATORY_TRACT | Status: DC
  Administered 2014-08-28 – 2014-08-29 (×4): 3 mL via RESPIRATORY_TRACT
  Filled 2014-08-28 (×4): qty 3

## 2014-08-28 MED ORDER — NITROGLYCERIN 0.4 MG SL SUBL: 0.4000 mg | SUBLINGUAL_TABLET | SUBLINGUAL | Status: DC | PRN

## 2014-08-28 MED ORDER — METHYLPREDNISOLONE SODIUM SUCC 125 MG IJ SOLR
60.0000 mg | Freq: Two times a day (BID) | INTRAMUSCULAR | Status: DC
Start: 2014-08-28 — End: 2014-08-30
  Administered 2014-08-29 – 2014-08-30 (×3): 60 mg via INTRAVENOUS
  Filled 2014-08-28 (×2): qty 0.96
  Filled 2014-08-28: qty 2
  Filled 2014-08-28 (×2): qty 0.96

## 2014-08-28 MED ORDER — INSULIN ASPART 100 UNIT/ML ~~LOC~~ SOLN
0.0000 [IU] | Freq: Three times a day (TID) | SUBCUTANEOUS | Status: DC
  Administered 2014-08-29: 20 [IU] via SUBCUTANEOUS
  Administered 2014-08-30: 11 [IU] via SUBCUTANEOUS

## 2014-08-28 MED ORDER — INSULIN ASPART 100 UNIT/ML ~~LOC~~ SOLN
0.0000 [IU] | Freq: Every day | SUBCUTANEOUS | Status: DC
  Administered 2014-08-29: 3 [IU] via SUBCUTANEOUS

## 2014-08-28 NOTE — Progress Notes (Signed)
ANTICOAGULATION CONSULT NOTE - Initial Consult  Pharmacy Consult for Warfarin Indication: mechanical AVR  Allergies  Allergen Reactions  . Acetaminophen Other (See Comments)    Liver transplant recipient   . Codeine Itching  . Mirtazapine Other (See Comments)    hallucination  . Morphine Itching and Other (See Comments)    hallucinate    Patient Measurements: Weight: 176 lb 9.4 oz (80.1 kg) Heparin Dosing Weight: n/a  Vital Signs: Temp: 96.8 F (36 C) (07/18 1439) Temp Source: Oral (07/18 1439) BP: 178/80 mmHg (07/18 1530) Pulse Rate: 75 (07/18 1530)  Labs:  Recent Labs  08/25/14 2000 08/28/14 1515 08/28/14 1530  HGB 11.9*  --  11.1*  HCT 37.6  --  34.4*  PLT 142*  --  122*  LABPROT  --  38.7*  --   INR  --  4.10*  --   CREATININE 7.01*  --  8.67*    Estimated Creatinine Clearance: 7.2 mL/min (by C-G formula based on Cr of 8.67).   Medical History: Past Medical History  Diagnosis Date  . 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 Chronic back pain   . CAD (coronary artery disease)   . Obesity   . Peripheral vascular disease hands and legs  . Anxiety   . Asthma   . GERD (gastroesophageal reflux disease)     takes Omeprazole daily  . Chronic constipation     takes MIralax and Colace daily  . Anemia     takes Folic Acid daily  . Hypothyroidism     takes Synthroid daily  . Depression     takes Cymbalta for "severe" depression  . Neuromuscular disorder     carpal tunnel in right hand  . Hypertension     takes Metoprolol and Lisinopril daily, sees Dr Bea Graff  . COPD (chronic obstructive pulmonary disease)   . Pneumonia     "today and several times before" (08/30/2012)  . Chronic bronchitis     "q yr w/season changes" (08/30/2012)  . Type II diabetes mellitus     Levemir 2units daily if > 150  . History of blood transfusion     "several" (08/30/2012)  . Hepatitis C   .  Migraine     "last migraine was in 2013" (08/30/2012)  . Headache     "at least monthly" (08/30/2012)  . Arthritis     "left hand, back" (08/30/2012)  . End stage renal disease on dialysis 02/27/2011    Kidneys shut down at time of liver transplant in Sept 2011 at [facility] in [place], she has been on HD ever since.  Dialyzes at [facility] HD on TTS schedule.  Had L forearm graft used 10 months then removed Dec 2012 due to suspected infection.  A right upper arm AVG was placed Dec 2012 but she developed steal symptoms acutely and it was ligated the same day.  Never had an AV fistula due to small veins.  Now has L thigh AVG put in Jan 2013, has not clotted to date.    [name] CAD (coronary artery disease) Jan. 2015    Cath: 20% LAD, 50% D1; s/p LIMA-LAD  . Aortic stenosis   . Tobacco abuse   . S/P aortic valve replacement 03/15/13    Mechanical   . SVT (supraventricular tachycardia) 06/09/14    Assessment: 59 yo female with ESRD admitted with dyspnea.  Also on chronic Coumadin for mechanical AVR.  Pharmacy asked to continue Coumadin while she is here.  Last outpatient INR 3.1 on 7/14 and dose was decreased.  Today's INR 4.1  Goal of Therapy:  INR goal 2.5-3 per Coumadin clinic note Monitor platelets by anticoagulation protocol: Yes   Plan:  1. No Coumadin tonight. 2. Daily PT/INR.  Uvaldo Rising, BCPS  Clinical Pharmacist Pager [phone]  08/28/2014 4:02 PM

## 2014-08-28 NOTE — Telephone Encounter (Signed)
A referral was received for the T[facility] but it was then cancelled.

## 2014-08-28 NOTE — H&P (Signed)
History and Physical  [name]:[id number] DOB: 1955-07-02 DOA: 08/28/2014  Referring physician: Dr Lanny Hurst, ED physician PCP: No PCP Per Patient   Chief Complaint: Shortness of breath  HPI: [name] is a 59 y.o. 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 Patient presents with shortness of breath that started on Friday. Pressure was brought to the emergency department by her daughter on Friday where she received breathing treatment, diagnosed with COPD exacerbation, And sent home with steroids.  Patient did not pick up her prescriptions and her breathing worsened over the weekend. Breathing is worse with ambulation and movement. Patient having increased coughing spells. Breathing is improved after steroidal and breathing treatments at [facility] where she was taken by her daughter this morning. Patient was transferred here due to being fluid overloaded in addition to her COPD complaints. She denies missing any dialysis treatments. Her normal dialysis regimen is Tuesday Thursday and Saturday.   Review of Systems:   Pt complains of cough - nonproductive, dry.  Pt denies any fevers, chills, nausea, vomiting, chest pain, palpitations, abdominal pain, diarrhea, constipation, headache, blurred vision, melanoma, rectal bleeding.  Review of systems are otherwise negative  Past Medical History  Diagnosis Date  . S/P liver transplant     2011 at [facility] (cirrhosis due to hep C, got hep C from blood transfuion in 1980's per pt))  . Chronic back pain   . CAD (coronary artery disease)   . Obesity   . Peripheral vascular disease hands and legs  . Anxiety   . Asthma   . GERD (gastroesophageal reflux disease)     takes Omeprazole daily  . Chronic constipation     takes MIralax and Colace daily  . Anemia    takes Folic Acid daily  . Hypothyroidism     takes Synthroid daily  . Depression     takes Cymbalta for "severe" depression  . Neuromuscular disorder     carpal tunnel in right hand  . Hypertension     takes Metoprolol and Lisinopril daily, sees Dr Bea Graff  . COPD (chronic obstructive pulmonary disease)   . Pneumonia     "today and several times before" (08/30/2012)  . Chronic bronchitis     "q yr w/season changes" (08/30/2012)  . Type II diabetes mellitus     Levemir 2units daily if > 150  . History of blood transfusion     "several" (08/30/2012)  . Hepatitis C   . Migraine     "last migraine was in 2013" (08/30/2012)  . Headache     "at least monthly" (08/30/2012)  . Arthritis     "left hand, back" (08/30/2012)  . End stage renal disease on dialysis 02/27/2011    Kidneys shut down at time of liver transplant in Sept 2011 at [facility] in [place], she has been on HD ever since.  Dialyzes at [facility] HD on TTS schedule.  Had L forearm graft used 10 months then removed Dec 2012 due to suspected infection.  A right upper arm AVG was placed Dec 2012 but she developed steal symptoms acutely and it was ligated the same day.  Never had an AV fistula due to small veins.  Now has L thigh AVG put in Jan 2013, has not clotted to date.    [name] CAD (coronary artery disease) Jan.  2015    Cath: 20% LAD, 50% D1; s/p LIMA-LAD  . Aortic stenosis   . Tobacco abuse   . S/P aortic valve replacement 03/15/13    Mechanical   . SVT (supraventricular tachycardia) 06/09/14   Past Surgical History  Procedure Laterality Date  . Liver transplant  10/25/2009    sees Dr Ferol Luz 1 every 6 months, saw last in Dec 2013. [name] Coord [phone]  . Small intestine surgery  90's  . Thrombectomy    . Arteriovenous graft placement Left 10/03/10     forearm  . Avgg removal  12/23/2010    Procedure: REMOVAL OF ARTERIOVENOUS GORETEX GRAFT ([place]);  Surgeon: Elam Dutch, MD;  Location: [place];  Service: Vascular;   Laterality: Left;  procedure started _0 -1852  . Insertion of dialysis catheter  12/23/2010    Procedure: INSERTION OF DIALYSIS CATHETER;  Surgeon: Elam Dutch, MD;  Location: [place];  Service: Vascular;  Laterality: Right;  Right Internal Jugular 28cm dialysis catheter insertion procedure time 1701-1720   . Cholecystectomy  1993  . Cystoscopy  1990's  . Spinal growth rods  2010    "put 2 metal rods in my back; they had detetriorated" (08/30/2012)  . Av fistula placement  01/29/2011    Procedure: INSERTION OF ARTERIOVENOUS (AV) GORE-TEX GRAFT ARM;  Surgeon: Elam Dutch, MD;  Location: [place];  Service: Vascular;  Laterality: Right;  . Av fistula placement  03/10/2011    Procedure: INSERTION OF ARTERIOVENOUS (AV) GORE-TEX GRAFT THIGH;  Surgeon: Elam Dutch, MD;  Location: [place];  Service: Vascular;  Laterality: Left;  . Tubal ligation  1990's  . Aortic valve replacement N/A 03/15/2013    AVR; Surgeon: Ivin Poot, MD;  Location: [facility] OR; Open Heart Surgery;  22m[employer] mechanical prosthesis, top hat valve  . Coronary artery bypass graft N/A 03/15/2013    Procedure: CORONARY ARTERY BYPASS GRAFTING (CABG) times one using left internal mammary artery.;  Surgeon: PIvin Poot MD;  [place]  Service: Open Heart Surgery;  Laterality: N/A;  POSS CABG X 1  . Intraoperative transesophageal echocardiogram N/A 03/15/2013    Procedure: INTRAOPERATIVE TRANSESOPHAGEAL ECHOCARDIOGRAM;  Surgeon: PIvin Poot MD;  [place]  Service: Open Heart Surgery;  Laterality: N/A;  . Left heart catheterization with coronary/graft angiogram N/A 12/24/2011    Procedure: LEFT HEART CATHETERIZATION WITH CBeatrix Fetters  Surgeon: JLorretta Harp MD;  [facility];  Service: Cardiovascular;  Laterality: N/A;  . Left heart catheterization with coronary angiogram N/A 07/29/2012    Procedure: LEFT HEART CATHETERIZATION WITH CORONARY ANGIOGRAM;  Surgeon: TTroy Sine MD;   [facility];  Service: Cardiovascular;  Laterality: N/A;  . Left heart catheterization with coronary angiogram N/A 03/10/2013    Procedure: LEFT HEART CATHETERIZATION WITH CORONARY ANGIOGRAM;  Surgeon: TTroy Sine MD;  [facility];  Service: Cardiovascular;  Laterality: N/A;  . Left heart catheterization with coronary/graft angiogram N/A 12/16/2013    Procedure: LEFT HEART CATHETERIZATION WITH CBeatrix Fetters  Surgeon: TTroy Sine MD;  [facility];  Service: Cardiovascular;  Laterality: N/A;  . Thrombectomy and revision of arterioventous (av) goretex  graft Left 03/30/2014  . Thrombectomy and revision of arterioventous (av) goretex  graft Left 03/30/2014    Procedure: THROMBECTOMY AND REVISION OF ARTERIOVENTOUS (AV) GORETEX  GRAFT;  Surgeon: BConrad Forest Grove MD;  [place]  Service: Vascular;  Laterality: Left;  . Shuntogram Left  05/15/2014    Procedure: Earney Mallet;  Surgeon: Conrad [place] Murray, MD;  Location: [facility] CATH LAB;  Service: Cardiovascular;  Laterality: Left;   Social History:  reports that she has been smoking Cigarettes.  She has a 30 pack-year smoking history. She has never used smokeless tobacco. She reports that she drinks alcohol. She reports that she does not use illicit drugs. Patient lives at home  & is able to participate in activities of daily living   Allergies  Allergen Reactions  . Acetaminophen Other (See Comments)    Liver transplant recipient   . Codeine Itching  . Mirtazapine Other (See Comments)    hallucination  . Morphine Itching and Other (See Comments)    hallucinate    Family History  Problem Relation Age of Onset  . Cancer Mother   . Diabetes Mother   . Hypertension Mother   . Stroke Mother   . Cancer Father   . Anesthesia problems Neg Hx   . Hypotension Neg Hx   . Malignant hyperthermia Neg Hx   . Pseudochol deficiency Neg Hx       Prior to Admission medications   Medication Sig Start Date End Date  Taking? Authorizing Provider  albuterol (PROVENTIL,VENTOLIN) 90 MCG/ACT inhaler Inhale 1-2 puffs into the lungs every 6 (six) hours as needed for wheezing or shortness of breath.     Historical Provider, MD  aspirin EC 81 MG tablet Take 81 mg by mouth daily. Was given 4 tablets tonight    Historical Provider, MD  benzonatate (TESSALON) 100 MG capsule Take 1 capsule (100 mg total) by mouth every 8 (eight) hours. 08/25/14   Dorie Rank, MD  calcium acetate (PHOSLO) 667 MG capsule Take 667-2,001 mg by mouth See admin instructions. Take 3 capsules (2001 mg) with meals and 1 capsule (667 mg) with snacks    Historical Provider, MD  cinacalcet (SENSIPAR) 30 MG tablet Take 30 mg by mouth daily.    Historical Provider, MD  gabapentin (NEURONTIN) 300 MG capsule Take 300 mg by mouth at bedtime.     Historical Provider, MD  insulin aspart (NOVOLOG) 100 UNIT/ML injection Inject 1-3 Units into the skin 3 (three) times daily as needed for high blood sugar (CBG>150). Per sliding scale    Historical Provider, MD  levothyroxine (SYNTHROID, LEVOTHROID) 150 MCG tablet Take 150 mcg by mouth daily before breakfast.    Historical Provider, MD  metoprolol tartrate (LOPRESSOR) 25 MG tablet Take 25 mg by mouth 2 (two) times daily.  03/16/14   Historical Provider, MD  nitroGLYCERIN (NITROSTAT) 0.4 MG SL tablet Place 1 tablet (0.4 mg total) under the tongue every 5 (five) minutes as needed for chest pain. 03/20/14   Silver Huguenin Elgergawy, MD  ondansetron (ZOFRAN-ODT) 4 MG disintegrating tablet Take 4 mg by mouth every 8 (eight) hours as needed for nausea or vomiting.    Historical Provider, MD  predniSONE (STERAPRED UNI-PAK 21 TAB) 10 MG (21) TBPK tablet Take 6 tabs by mouth daily  for 2 days, then 5 tabs for 2 days, then 4 tabs for 2 days, then 3 tabs for 2 days, 2 tabs for 2 days, then 1 tab by mouth daily for 2 days 08/25/14   Dorie Rank, MD  tacrolimus (PROGRAF) 1 MG capsule Take 3 mg by mouth 2 (two) times daily.     Historical Provider,  MD  traMADol (ULTRAM) 50 MG tablet Take 1 tablet (50 mg total) by mouth every 12 (twelve) hours as needed for moderate  pain. 05/20/14   Alvia Grove, PA-C  verapamil (CALAN) 120 MG tablet Take 1 tablet (120 mg total) by mouth at bedtime. 06/19/14   Troy Sine, MD  warfarin (COUMADIN) 2 MG tablet Take 2 tablets by mouth daily or as directed by coumadin clinic 06/01/14   Troy Sine, MD  warfarin (COUMADIN) 4 MG tablet Take 1 tablet (4 mg total) by mouth daily at 6 PM. Patient taking differently: Take 2-4 mg by mouth daily at 6 PM. 56m every day except Friday. Only 242mon Fridays. 05/20/14   KiAlvia GrovePA-C    Physical Exam: Vitals and chart - reviewed   GenerElderly black female. Awake and alert and oriented x3. No acute cardiopulmonary distress.  Eyes: Pupils equal, round, reactive to light. Extraocular muscles are intact. Sclerae anicteric and noninjected.  ENT: Moist mucosal membranes. No mucosal lesions. Teeth in moderate repair  Neck: Neck supple without lymphadenopathy. No carotid bruits. No masses palpated.  Cardiovascular: Regular rate with normal S1-S2 sounds. No murmurs, rubs, gallops auscultated. No JVD.  Respiratory: Prolonged excretion phase. Lung sounds are with mild wheezing and rales bilaterally in bases up to mid lung fields  Abdomen: Soft, nontender, nondistended. Active bowel sounds. No masses or hepatosplenomegaly  Skin: Dry, warm to touch. 2+ dorsalis pedis and radial pulses. Musculoskeletal: No calf or leg pain. All major joints not erythematous nontender.  Psychiatric: Intact judgment and insight.  Neurologic: No focal neurological deficits. Cranial nerves II through XII are grossly intact.           Labs on Admission:  Basic Metabolic Panel:  Recent Labs Lab 08/25/14 2000  NA 138  K 3.9  CL 96*  CO2 28  GLUCOSE 139*  BUN 17  CREATININE 7.01*  CALCIUM 9.5   [facility]: 141, K 3.7, Cl 98, HCO3 31, BUN 21, Cr 7.3, Gap 16,  Glucose 178, Ca 9.4, Bili 1.1, AST 42, ALT 36, Alk Phos 111  Liver Function Tests: No results for input(s): AST, ALT, ALKPHOS, BILITOT, PROT, ALBUMIN in the last 168 hours. No results for input(s): LIPASE, AMYLASE in the last 168 hours. No results for input(s): AMMONIA in the last 168 hours. CBC:  Recent Labs Lab 08/25/14 2000  WBC 5.9  HGB 11.9*  HCT 37.6  MCV 98.2  PLT 142*   WBC: 6.7, Hgb 11.9, HCT 35.6, Plat 117  Cardiac Enzymes: No results for input(s): CKTOTAL, CKMB, CKMBINDEX, TROPONINI in the last 168 hours.  Trop: 0.04 INR 4.5  BNP (last 3 results)  Recent Labs  06/09/14 0444 06/17/14 1103 08/25/14 2000  BNP 351.9* 1109.3* 1999.3*    ProBNP (last 3 results) 81000  CBG: No results for input(s): GLUCAP in the last 168 hours.  Radiological Exams on Admission: No results found.   [facility] of congestive [facility] failure, stable from recent prior study.  No new opacity.  Status post aortic valve replacement.  EKG: Pending  Assessment/Plan Present on Admission:  . COPD exacerbation . Acute respiratory failure . Acute on chronic systolic CHF (congestive heart failure)  This patient was discussed with the ED physician, including pertinent vitals, physical exam findings, labs, and imaging.  We also discussed care given by the ED provider.  AdHazel Runephrology consulted Dialysis to help with volume overload and acute on chronic heart failure Obtain echocardiogram with contrast Solu-Medrol 60 mg twice a day Nebulizer treatments Strict I's and O's Check BMP tomorrow morning O2 to maintain oxygen saturations  consults pharmacy for Coumadin management as she is supratherapeutic   DVT prophylaxis: Coumadin   Consultants: nephrology   Code Status: Full code  Family Communication: Daughter in the room   Disposition Plan: Dialysis today    Truett Mainland, DO [facility] Pager [phone]

## 2014-08-28 NOTE — Consult Note (Signed)
[name] 08/28/2014 Rexene Agent Requesting Physician:  [name] MD  Reason for Consult:  SOB, ESRD HPI:  59 year old female ESRD [facility] [place] was admitted overnight to [facility] with dyspnea. She tells me she's had this problem for 2 weeks and in fact presented to [facility] emergency Department earlier last week. At that time is felt that she had an acute exacerbation of heart failure and since she was due for dialysis that next morning was discharged in emergency room to her dialysis unit. She has been leaving hemodialysis 0.5-1 kg below her target weight. Her fluid gains are very reasonable. Her hemoglobin 7 greater than 10. She tells me that she has had both exertional dyspnea as well as orthopnea which is been progressive for 2 weeks' time. She was seen by the rounding physician on 7/14 but failed to mention her dyspnea at that time.  She uses a left leg AV graft which has issues with bleeding but has been stable lately. She does not receive heparin with dialysis. She has attended all of her treatments for full-time.  Pertinent past medical history also includes history of liver transplant, CAD, aortic stenosis status post mechanical aortic valve replacement on anticoagulation. Immunosuppression is with tacrolimus.    ROS Balance of 12 systems is negative w/ exceptions as above  Outpt HD Orders Unit: [place] Days: THS Time: 3h17min Dialyzer: F180 EDW: 80.5kg (leaving under) K/Ca: 2/2.25 Access: LLE AVG Needle Size: 15g BFR/DFR: 450 / a1.5 UF Proflie: none VDRA: calcitriol 29mcg qTx EPO: mircera 75 q2wk IV Fe: 50mg  Venfoer qWk Heparin: NO HEPARIn Most Recent Phos / PTH: 6.2 / 703 Most Recent TSAT: 47 Most Recent eKT/V: 1.63 Treatment Adherence: Very good  PMH  Past Medical History  Diagnosis Date  . 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 Chronic back pain   . CAD (coronary  artery disease)   . Obesity   . Peripheral vascular disease hands and legs  . Anxiety   . Asthma   . GERD (gastroesophageal reflux disease)     takes Omeprazole daily  . Chronic constipation     takes MIralax and Colace daily  . Anemia     takes Folic Acid daily  . Hypothyroidism     takes Synthroid daily  . Depression     takes Cymbalta for "severe" depression  . Neuromuscular disorder     carpal tunnel in right hand  . Hypertension     takes Metoprolol and Lisinopril daily, sees Dr Bea Graff  . COPD (chronic obstructive pulmonary disease)   . Pneumonia     "today and several times before" (08/30/2012)  . Chronic bronchitis     "q yr w/season changes" (08/30/2012)  . Type II diabetes mellitus     Levemir 2units daily if > 150  . History of blood transfusion     "several" (08/30/2012)  . Hepatitis C   . Migraine     "last migraine was in 2013" (08/30/2012)  . Headache     "at least monthly" (08/30/2012)  . Arthritis     "left hand, back" (08/30/2012)  . End stage renal disease on dialysis 02/27/2011    Kidneys shut down at time of liver transplant in Sept 2011 at [facility] in [place], she has been on HD ever since.  Dialyzes at [facility] HD on TTS schedule.  Had L forearm  graft used 10 months then removed Dec 2012 due to suspected infection.  A right upper arm AVG was placed Dec 2012 but she developed steal symptoms acutely and it was ligated the same day.  Never had an AV fistula due to small veins.  Now has L thigh AVG put in Jan 2013, has not clotted to date.    [name] CAD (coronary artery disease) Jan. 2015    Cath: 20% LAD, 50% D1; s/p LIMA-LAD  . Aortic stenosis   . Tobacco abuse   . S/P aortic valve replacement 03/15/13    Mechanical   . SVT (supraventricular tachycardia) 06/09/14   PSH  Past Surgical History  Procedure Laterality Date  . Liver transplant  10/25/2009    sees Dr Ferol Luz 1 every 6 months, saw last in Dec 2013. [name] Coord [phone]  . Small intestine  surgery  90's  . Thrombectomy    . Arteriovenous graft placement Left 10/03/10     forearm  . Avgg removal  12/23/2010    Procedure: REMOVAL OF ARTERIOVENOUS GORETEX GRAFT ([place]);  Surgeon: Elam Dutch, MD;  Location: [place];  Service: Vascular;  Laterality: Left;  procedure started @1736 -5621  . Insertion of dialysis catheter  12/23/2010    Procedure: INSERTION OF DIALYSIS CATHETER;  Surgeon: Elam Dutch, MD;  Location: [place];  Service: Vascular;  Laterality: Right;  Right Internal Jugular 28cm dialysis catheter insertion procedure time 1701-1720   . Cholecystectomy  1993  . Cystoscopy  1990's  . Spinal growth rods  2010    "put 2 metal rods in my back; they had detetriorated" (08/30/2012)  . Av fistula placement  01/29/2011    Procedure: INSERTION OF ARTERIOVENOUS (AV) GORE-TEX GRAFT ARM;  Surgeon: Elam Dutch, MD;  Location: [place];  Service: Vascular;  Laterality: Right;  . Av fistula placement  03/10/2011    Procedure: INSERTION OF ARTERIOVENOUS (AV) GORE-TEX GRAFT THIGH;  Surgeon: Elam Dutch, MD;  Location: [place];  Service: Vascular;  Laterality: Left;  . Tubal ligation  1990's  . Aortic valve replacement N/A 03/15/2013    AVR; Surgeon: Ivin Poot, MD;  Location: [facility] OR; Open Heart Surgery;  77mm[employer] mechanical prosthesis, top hat valve  . Coronary artery bypass graft N/A 03/15/2013    Procedure: CORONARY ARTERY BYPASS GRAFTING (CABG) times one using left internal mammary artery.;  Surgeon: Ivin Poot, MD;  Location: [place];  Service: Open Heart Surgery;  Laterality: N/A;  POSS CABG X 1  . Intraoperative transesophageal echocardiogram N/A 03/15/2013    Procedure: INTRAOPERATIVE TRANSESOPHAGEAL ECHOCARDIOGRAM;  Surgeon: Ivin Poot, MD;  Location: [place];  Service: Open Heart Surgery;  Laterality: N/A;  . Left heart catheterization with coronary/graft angiogram N/A 12/24/2011    Procedure: LEFT HEART CATHETERIZATION WITH Beatrix Fetters;  Surgeon:  [name], MD;  Location: [facility] CATH LAB;  Service: Cardiovascular;  Laterality: N/A;  . Left heart catheterization with coronary angiogram N/A 07/29/2012    Procedure: LEFT HEART CATHETERIZATION WITH CORONARY ANGIOGRAM;  Surgeon: Troy Sine, MD;  Location: [facility] CATH LAB;  Service: Cardiovascular;  Laterality: N/A;  . Left heart catheterization with coronary angiogram N/A 03/10/2013    Procedure: LEFT HEART CATHETERIZATION WITH CORONARY ANGIOGRAM;  Surgeon: Troy Sine, MD;  Location: [facility] CATH LAB;  Service: Cardiovascular;  Laterality: N/A;  . Left heart catheterization with coronary/graft angiogram N/A 12/16/2013    Procedure: LEFT HEART CATHETERIZATION WITH Beatrix Fetters;  Surgeon: Troy Sine,  MD;  Location: [place] CATH LAB;  Service: Cardiovascular;  Laterality: N/A;  . Thrombectomy and revision of arterioventous (av) goretex  graft Left 03/30/2014  . Thrombectomy and revision of arterioventous (av) goretex  graft Left 03/30/2014    Procedure: THROMBECTOMY AND REVISION OF ARTERIOVENTOUS (AV) GORETEX  GRAFT;  Surgeon: Conrad Republic, MD;  Location: [place];  Service: Vascular;  Laterality: Left;  . Shuntogram Left 05/15/2014    Procedure: SHUNTOGRAM;  Surgeon: Conrad Mescal, MD;  Location: [facility] CATH LAB;  Service: Cardiovascular;  Laterality: Left;   FH  Family History  Problem Relation Age of Onset  . Cancer Mother   . Diabetes Mother   . Hypertension Mother   . Stroke Mother   . Cancer Father   . Anesthesia problems Neg Hx   . Hypotension Neg Hx   . Malignant hyperthermia Neg Hx   . Pseudochol deficiency Neg Hx    SH  reports that she has been smoking Cigarettes.  She has a 30 pack-year smoking history. She has never used smokeless tobacco. She reports that she drinks alcohol. She reports that she does not use illicit drugs. Allergies  Allergies  Allergen Reactions  . Acetaminophen Other (See Comments)    Liver transplant recipient   . Codeine Itching  . Mirtazapine Other  (See Comments)    hallucination  . Morphine Itching and Other (See Comments)    hallucinate   Home medications Prior to Admission medications   Medication Sig Start Date End Date Taking? Authorizing Provider  albuterol (PROVENTIL,VENTOLIN) 90 MCG/ACT inhaler Inhale 1-2 puffs into the lungs every 6 (six) hours as needed for wheezing or shortness of breath.     Historical Provider, MD  aspirin EC 81 MG tablet Take 81 mg by mouth daily. Was given 4 tablets tonight    Historical Provider, MD  benzonatate (TESSALON) 100 MG capsule Take 1 capsule (100 mg total) by mouth every 8 (eight) hours. 08/25/14   Dorie Rank, MD  calcium acetate (PHOSLO) 667 MG capsule Take 667-2,001 mg by mouth See admin instructions. Take 3 capsules (2001 mg) with meals and 1 capsule (667 mg) with snacks    Historical Provider, MD  cinacalcet (SENSIPAR) 30 MG tablet Take 30 mg by mouth daily.    Historical Provider, MD  gabapentin (NEURONTIN) 300 MG capsule Take 300 mg by mouth at bedtime.     Historical Provider, MD  insulin aspart (NOVOLOG) 100 UNIT/ML injection Inject 1-3 Units into the skin 3 (three) times daily as needed for high blood sugar (CBG>150). Per sliding scale    Historical Provider, MD  levothyroxine (SYNTHROID, LEVOTHROID) 150 MCG tablet Take 150 mcg by mouth daily before breakfast.    Historical Provider, MD  metoprolol tartrate (LOPRESSOR) 25 MG tablet Take 25 mg by mouth 2 (two) times daily.  03/16/14   Historical Provider, MD  nitroGLYCERIN (NITROSTAT) 0.4 MG SL tablet Place 1 tablet (0.4 mg total) under the tongue every 5 (five) minutes as needed for chest pain. 03/20/14   Silver Huguenin Elgergawy, MD  ondansetron (ZOFRAN-ODT) 4 MG disintegrating tablet Take 4 mg by mouth every 8 (eight) hours as needed for nausea or vomiting.    Historical Provider, MD  predniSONE (STERAPRED UNI-PAK 21 TAB) 10 MG (21) TBPK tablet Take 6 tabs by mouth daily  for 2 days, then 5 tabs for 2 days, then 4 tabs for 2 days, then 3 tabs for 2  days, 2 tabs for 2 days, then 1 tab by  mouth daily for 2 days 08/25/14   Dorie Rank, MD  [facility] HFA 108 [phone] BASE) MCG/ACT inhaler  05/22/14   Historical Provider, MD  tacrolimus (PROGRAF) 1 MG capsule Take 3 mg by mouth 2 (two) times daily.     Historical Provider, MD  traMADol (ULTRAM) 50 MG tablet Take 1 tablet (50 mg total) by mouth every 12 (twelve) hours as needed for moderate pain. 05/20/14   Alvia Grove, PA-C  verapamil (CALAN) 120 MG tablet Take 1 tablet (120 mg total) by mouth at bedtime. 06/19/14   Troy Sine, MD  warfarin (COUMADIN) 2 MG tablet Take 2 tablets by mouth daily or as directed by coumadin clinic 06/01/14   Troy Sine, MD  warfarin (COUMADIN) 4 MG tablet Take 1 tablet (4 mg total) by mouth daily at 6 PM. Patient taking differently: Take 2-4 mg by mouth daily at 6 PM. 4mg  every day except Friday. Only 2mg  on Fridays. 05/20/14   Alvia Grove, PA-C    Current Medications Scheduled Meds: Continuous Infusions: PRN Meds:.  CBC  Recent Labs Lab 08/25/14 2000  WBC 5.9  HGB 11.9*  HCT 37.6  MCV 98.2  PLT 209*   Basic Metabolic Panel  Recent Labs Lab 08/25/14 2000  NA 138  K 3.9  CL 96*  CO2 28  GLUCOSE 139*  BUN 17  CREATININE 7.01*  CALCIUM 9.5    Physical Exam  There were no vitals taken for this visit. GEN: lying at 30dec, mild inc WOB, using [place] ENT: Axis in place, poor dentition EYES: EOMI, glass on CV: regular, nl s1s2 PULM: diminished in bases, mild inc WOB ABD: s/nt/nd SKIN: no rashes/lesiosn EXT: trace-1+ LEE LLE AVG +B/T   A/P 1. ESRD:  1. [facility] [place] [facility] 2. HD today, aggressive UF 3. Use AVG, no heparin 2/2 hx/o prolonged bleeding 2. HTN/Vol:  1. 4L UF today 2. WIll need lower EDW upon DC 3. SOB / Orthopnea, suspected CHF  1. As Per #1 and #2 2. TRH t o further eval cardiac status 4. Anemia:  1. Hb stable 2. Last given 71/2 5. MBD: Cont sensipar, binder, VDRA 6. Mechanical AV on warfarin 7. S/p Liver Tplt on  Tacrolimus  Pearson Grippe MD 08/28/2014, 10:25 AM

## 2014-08-28 NOTE — Procedures (Signed)
I was present at this dialysis session. I have reviewed the session itself and made appropriate changes.   Pearson Grippe  MD 08/28/2014, 3:53 PM

## 2014-08-29 ENCOUNTER — Ambulatory Visit (HOSPITAL_COMMUNITY): Payer: Medicare Other | Attending: Family Medicine

## 2014-08-29 DIAGNOSIS — I351 Nonrheumatic aortic (valve) insufficiency: Secondary | ICD-10-CM | POA: Diagnosis not present

## 2014-08-29 DIAGNOSIS — I509 Heart failure, unspecified: Secondary | ICD-10-CM

## 2014-08-29 DIAGNOSIS — I517 Cardiomegaly: Secondary | ICD-10-CM | POA: Insufficient documentation

## 2014-08-29 DIAGNOSIS — J9601 Acute respiratory failure with hypoxia: Secondary | ICD-10-CM

## 2014-08-29 DIAGNOSIS — Z952 Presence of prosthetic heart valve: Secondary | ICD-10-CM | POA: Insufficient documentation

## 2014-08-29 LAB — CBC
HCT: 36.9 % (ref 36.0–46.0)
Hemoglobin: 11.9 g/dL — ABNORMAL LOW (ref 12.0–15.0)
MCH: 31.6 pg (ref 26.0–34.0)
MCHC: 32.2 g/dL (ref 30.0–36.0)
MCV: 98.1 fL (ref 78.0–100.0)
Platelets: 159 K/uL (ref 150–400)
RBC: 3.76 MIL/uL — ABNORMAL LOW (ref 3.87–5.11)
RDW: 17.5 % — ABNORMAL HIGH (ref 11.5–15.5)
WBC: 16.4 K/uL — ABNORMAL HIGH (ref 4.0–10.5)

## 2014-08-29 LAB — GLUCOSE, CAPILLARY
Glucose-Capillary: 363 mg/dL — ABNORMAL HIGH (ref 65–99)
Glucose-Capillary: 347 mg/dL — ABNORMAL HIGH (ref 65–99)
Glucose-Capillary: 143 mg/dL — ABNORMAL HIGH (ref 65–99)
Glucose-Capillary: 275 mg/dL — ABNORMAL HIGH (ref 65–99)

## 2014-08-29 LAB — BASIC METABOLIC PANEL
ANION GAP: 12 (ref 5–15)
BUN: 16 mg/dL (ref 6–20)
CO2: 28 mmol/L (ref 22–32)
CREATININE: 4.88 mg/dL — AB (ref 0.44–1.00)
Calcium: 9 mg/dL (ref 8.9–10.3)
Chloride: 92 mmol/L — ABNORMAL LOW (ref 101–111)
GFR, EST AFRICAN AMERICAN: 10 mL/min — AB (ref >60)
GFR, EST NON AFRICAN AMERICAN: 9 mL/min — AB (ref >60)
GLUCOSE: 333 mg/dL — AB (ref 65–99)
POTASSIUM: 4.1 mmol/L (ref 3.5–5.1)
SODIUM: 132 mmol/L — AB (ref 135–145)

## 2014-08-29 LAB — BLOOD GAS, ARTERIAL
Acid-Base Excess: 4.8 mmol/L — ABNORMAL HIGH (ref 0.0–2.0)
Bicarbonate: 28.9 mEq/L — ABNORMAL HIGH (ref 20.0–24.0)
Drawn by: 331001
O2 Content: 3 L/min
O2 Saturation: 97.6 %
PH ART: 7.439 (ref 7.350–7.450)
Patient temperature: 97.6
TCO2: 30.3 mmol/L (ref 0–100)
pCO2 arterial: 43.1 mmHg (ref 35.0–45.0)
pO2, Arterial: 91.1 mmHg (ref 80.0–100.0)

## 2014-08-29 LAB — PROTIME-INR
INR: 3.72 — ABNORMAL HIGH (ref 0.00–1.49)
Prothrombin Time: 36 seconds — ABNORMAL HIGH (ref 11.6–15.2)

## 2014-08-29 MED ORDER — NEPRO/CARBSTEADY PO LIQD
237.0000 mL | Freq: Two times a day (BID) | ORAL | Status: DC
  Administered 2014-08-29 – 2014-08-30 (×3): 237 mL via ORAL

## 2014-08-29 MED ORDER — IPRATROPIUM-ALBUTEROL 0.5-2.5 (3) MG/3ML IN SOLN: 3.0000 mL | Freq: Four times a day (QID) | RESPIRATORY_TRACT | Status: DC | PRN

## 2014-08-29 MED ORDER — RENA-VITE PO TABS
1.0000 | ORAL_TABLET | Freq: Every day | ORAL | Status: DC
  Administered 2014-08-29: 1 via ORAL
  Filled 2014-08-29 (×2): qty 1

## 2014-08-29 NOTE — Progress Notes (Signed)
ANTICOAGULATION CONSULT NOTE - Follow-Up Consult  Pharmacy Consult for Warfarin Indication: mechanical AVR  Allergies  Allergen Reactions  . Acetaminophen Other (See Comments)    Liver transplant recipient   . Codeine Itching  . Mirtazapine Other (See Comments)    hallucination  . Morphine Itching and Other (See Comments)    hallucinate    Patient Measurements: Weight: 170 lb 10.2 oz (77.4 kg) Heparin Dosing Weight: n/a  Vital Signs: Temp: 97.6 F (36.4 C) (07/19 0811) Temp Source: Axillary (07/19 0811) BP: 116/49 mmHg (07/19 0811) Pulse Rate: 69 (07/19 0811)  Labs:  Recent Labs  08/28/14 1515 08/28/14 1530 08/29/14 0526  HGB  --  11.1*  --   HCT  --  34.4*  --   PLT  --  122*  --   LABPROT 38.7*  --  36.0*  INR 4.10*  --  3.72*  CREATININE  --  8.67* 4.88*    Estimated Creatinine Clearance: 12.6 mL/min (by C-G formula based on Cr of 4.88).   Medical History: Past Medical History  Diagnosis Date  . 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 Chronic back pain   . CAD (coronary artery disease)   . Obesity   . Peripheral vascular disease hands and legs  . Anxiety   . Asthma   . GERD (gastroesophageal reflux disease)     takes Omeprazole daily  . Chronic constipation     takes MIralax and Colace daily  . Anemia     takes Folic Acid daily  . Hypothyroidism     takes Synthroid daily  . Depression     takes Cymbalta for "severe" depression  . Neuromuscular disorder     carpal tunnel in right hand  . Hypertension     takes Metoprolol and Lisinopril daily, sees Dr Bea Graff  . COPD (chronic obstructive pulmonary disease)   . Pneumonia     "today and several times before" (08/30/2012)  . Chronic bronchitis     "q yr w/season changes" (08/30/2012)  . Type II diabetes mellitus     Levemir 2units daily if > 150  . History of blood transfusion     "several" (08/30/2012)  . Hepatitis C    . Migraine     "last migraine was in 2013" (08/30/2012)  . Headache     "at least monthly" (08/30/2012)  . Arthritis     "left hand, back" (08/30/2012)  . End stage renal disease on dialysis 02/27/2011    Kidneys shut down at time of liver transplant in Sept 2011 at [facility] in [place], she has been on HD ever since.  Dialyzes at [facility] HD on TTS schedule.  Had L forearm graft used 10 months then removed Dec 2012 due to suspected infection.  A right upper arm AVG was placed Dec 2012 but she developed steal symptoms acutely and it was ligated the same day.  Never had an AV fistula due to small veins.  Now has L thigh AVG put in Jan 2013, has not clotted to date.    [name] CAD (coronary artery disease) Jan. 2015    Cath: 20% LAD, 50% D1; s/p LIMA-LAD  . Aortic stenosis   . Tobacco abuse   . S/P aortic valve replacement 03/15/13    Mechanical   . SVT (supraventricular tachycardia) 06/09/14    Assessment: 59 yo female with ESRD admitted  with dyspnea.  Also on chronic Coumadin for mechanical AVR.  Pharmacy asked to continue Coumadin while she is here.  Last outpatient INR 3.1 on 7/14 and dose was decreased.  Today's INR 3.72  Goal of Therapy:  INR goal 2.5-3 per Coumadin clinic note Monitor platelets by anticoagulation protocol: Yes   Plan:  1. No Coumadin tonight. 2. Daily PT/INR.  Uvaldo Rising, BCPS  Clinical Pharmacist Pager [phone]  08/29/2014 10:00 AM

## 2014-08-29 NOTE — Progress Notes (Signed)
Inpatient Diabetes Program Recommendations  AACE/ADA: New Consensus Statement on Inpatient Glycemic Control (2013)  Target Ranges:  Prepandial:   less than 140 mg/dL      Peak postprandial:   less than 180 mg/dL (1-2 hours)      Critically ill patients:  140 - 180 mg/dL   Results for HIBO, BLASDELL (MRN [id number]) as of 08/29/2014 13:47  Ref. Range 08/29/2014 08:10 08/29/2014 11:36  Glucose-Capillary Latest Ref Range: 65-99 mg/dL 363 (H) 347 (H)     Admit with: SOB  History: DM, COPD, ESRD, Liver Transplant  Home DM Meds: Novolog SSI  Current DM Orders: Novolog Resistant SSI (0-20 units) TID AC + HS    -Note patient started on IV Solumedrol 60 mg Q12 hours at midnight.    -Glucose levels significantly elevated today.    MD- Please consider the following in-hospital insulin adjustments while patient getting IV steroids:  1. Start basal insulin- Recommend Levemir 12 units daily to start (0.15 units/kg dosing)  2. May want to decrease Novolog SSI back to Moderate scale (0-15 units) TID AC + HS    Will follow Wyn Quaker RN, MSN, CDE Diabetes Coordinator Inpatient Glycemic Control Team Team Pager: [phone] (8a-5p)

## 2014-08-29 NOTE — Progress Notes (Signed)
Utilization review completed. Pharrah Rottman, RN, BSN. 

## 2014-08-29 NOTE — Progress Notes (Signed)
[facility] Progress Note  Assessment/Plan: 1. COPD exacerbation: per primary. Appears lethargic, almost obtunded. Agas OK. Lowered O2 to 1.5 liters. More alert now.  2. ESRD -T,T,S at [facility] HD 08/28/14. No heparin. 3. HTN/volume - Had HD yesterday Net UF 2500cc Post wt 77.4. Below in center EDW 80.5 BP 102/58-186/81. Short hd today to get back on schedule. Attempt 1-2 liters. Needs new EDW upon DC.  4. CKD Bone/Mineral: Ca 9.0 C Ca 9.2. Phos 6.2  On Ca Acetate binders, Calcitriol. Monitor Ca.  5. Anemia: HGB 11.1 Last dose of Mircera 75 mcg 08/22/14. Monitor CBC, may add aranesp. Cont. Weekly venofer. 6. Nutrition: Albumin 3.0 Renal Diet, Renal vit. Add nephro. 7.  DM: SS per primary. BS 337 this AM. On Solumedrol.  8.  Immunosuppression:  Continue Tacrolimus.     [name] NP-C 08/29/2014, 8:18 AM  [facility] [phone]  Subjective:  Moaning, difficult to arouse. Needed sternal rub.  Very lethargic, able to tell me she is at [facility]. On O2 @ 3 L/M .  Objective Filed Vitals:   08/28/14 2105 08/29/14 0208 08/29/14 0514 08/29/14 0811  BP:   130/44 116/49  Pulse:   70 69  Temp:   98.7 F (37.1 C) 97.6 F (36.4 C)  TempSrc:   Oral Axillary  Resp:   18 16  Weight:      SpO2: 98% 98% 100% 98%   Physical Exam General: Well nourished female, lethargic but gradually answering questions. Heart: S1,S2 RRR No M/G/R Lungs: Bilateral breath sounds decreased in bases essentially clear. O2 at 3L/M. No WOB. Agas pending Abdomen:soft nontender. Active BS Extremities: No LE edema. 1+ peripheral pulses. Dialysis Access: L thigh graft + bruit  Dialysis Orders: [place],T,T,S. 3 hours 45 minute. No Heparin. 2.0 K 2.25 Ca 1.0 Mg Linear Sodium, EDW 80.5 BFR 450 DNR Autoflow 1.5 Mircera 75 mcg last dose 08/22/14 Calcitriol 1.0 mcg PO T,T,S Venofer 50 mg IV weekly in HD (last dose 08/24/2014)   Additional Objective Labs: Basic Metabolic  Panel:  Recent Labs Lab 08/25/14 2000 08/28/14 1530 08/29/14 0526  NA 138 138 132*  K 3.9 3.7 4.1  CL 96* 97* 92*  CO2 28 27 28   GLUCOSE 139* 303* 333*  BUN 17 20 16   CREATININE 7.01* 8.67* 4.88*  CALCIUM 9.5 9.2 9.0  PHOS  --  6.2*  --    Liver Function Tests:  Recent Labs Lab 08/28/14 1530  ALBUMIN 3.0*   No results for input(s): LIPASE, AMYLASE in the last 168 hours. CBC:  Recent Labs Lab 08/25/14 2000 08/28/14 1530  WBC 5.9 6.1  HGB 11.9* 11.1*  HCT 37.6 34.4*  MCV 98.2 97.7  PLT 142* 122*   Blood Culture    Component Value Date/Time   SDES BLOOD RIGHT HAND 07/04/2013 0024   SPECREQUEST BOTTLES DRAWN AEROBIC ONLY 1CC 07/04/2013 0024   CULT  07/04/2013 0024    NO GROWTH 5 DAYS Performed at [place] 07/10/2013 FINAL 07/04/2013 0024    Cardiac Enzymes: No results for input(s): CKTOTAL, CKMB, CKMBINDEX, TROPONINI in the last 168 hours. CBG:  Recent Labs Lab 08/28/14 1141 08/28/14 2017  GLUCAP 204* 196*   Iron Studies: No results for input(s): IRON, TIBC, TRANSFERRIN, FERRITIN in the last 72 hours. @lablastinr3 @ Studies/Results: No results found. Medications:    aspirin EC  81 mg Oral Daily   benzonatate  100 mg Oral Q8H   calcitRIOL  1 mcg Oral Q T,Th,Sa-HD  calcium acetate  1,334 mg Oral q1800   calcium acetate  667 mg Oral BID   cinacalcet  30 mg Oral Q lunch   gabapentin  300 mg Oral QHS   insulin aspart  0-20 Units Subcutaneous TID WC   insulin aspart  0-5 Units Subcutaneous QHS   ipratropium-albuterol  3 mL Nebulization Q6H   levothyroxine  150 mcg Oral QAC breakfast   methylPREDNISolone (SOLU-MEDROL) injection  60 mg Intravenous Q12H   metoprolol tartrate  25 mg Oral BID   sodium chloride  3 mL Intravenous Q12H   tacrolimus  3 mg Oral BID   verapamil  120 mg Oral QHS

## 2014-08-29 NOTE — Progress Notes (Signed)
  Echocardiogram 2D Echocardiogram has been performed.  Jennette Dubin 08/29/2014, 1:43 PM

## 2014-08-29 NOTE — Progress Notes (Addendum)
Patient ID: [name], female   DOB: [birth year], 59 y.o.   MRN: [id number]  [facility] PROGRESS NOTE  [name] PJK:[id number] DOB: 03/08/1955 DOA: 08/28/2014 PCP: No PCP Per Patient   Brief narrative:    59 y.o. 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 Patient presented with shortness of breath that started several days prior to this admission. Of note, pt was recently started on Prednisone taper pack but has never picked up the prescription.   Assessment/Plan:    Acute respiratory distress secondary to acute COPD exacerbation and acute on chronic diastolic CHF - better this AM - continue BD's scheduled and as needed - continue to keep on oxygen via [place] if needed - also continue steroids with planned tapering  - volume control with HD  - will need to assess oxygen need piror to discharge   Acute encephalopathy - secondary to COPD outlined above  - more alert his AM and following commands appropriately  Acute on chronic diastolic CHF, grade II - volume control with HD - nephrology following   Leukocytosis - WBC was normal on admission but up this AM - steroid induces   ESRD - HD TTS, appreciate nephrology team following   HTN - reasonable inpatient control   Immunosuppression -  Continue Tacrolimus.   DVT prophylaxis - SCD's  Code Status: Full.  Family Communication:  plan of care discussed with the patient Disposition Plan: Home when stable.   IV access:  Peripheral IV Left thigh graft with + bruit   Procedures and diagnostic studies:    Dg Chest 2 View 08/25/2014  Moderate congestive heart failure.    Medical Consultants:  None  Other Consultants:  Nephrology   IAnti-Infectives:   None  Faye Ramsay, MD  [facility] Pager [phone]  If 7PM-7AM, please contact  night-coverage [email or web address] Password [email or web address] 08/29/2014, 4:16 PM   LOS: 1 day   HPI/Subjective: No events overnight.   Objective: Filed Vitals:   08/29/14 0208 08/29/14 0514 08/29/14 0811 08/29/14 1345  BP:  130/44 116/49   Pulse:  70 69   Temp:  98.7 F (37.1 C) 97.6 F (36.4 C)   TempSrc:  Oral Axillary   Resp:  18 16   Weight:      SpO2: 98% 100% 98% 98%    Intake/Output Summary (Last 24 hours) at 08/29/14 1616 Last data filed at 08/29/14 1349  Gross per 24 hour  Intake    513 ml  Output   2500 ml  Net  -1987 ml    Exam:   General:  Pt is alert, follows commands appropriately, not in acute distress  Cardiovascular: Regular rate and rhythm, S1/S2, no murmurs, no rubs, no gallops  Respiratory: Clear to auscultation bilaterally, mild exp wheezing, no rhonchi   Abdomen: Soft, non tender, non distended, bowel sounds present, no guarding  Extremities: No edema, pulses DP and PT palpable bilaterally   Data Reviewed: Basic Metabolic Panel:  Recent Labs Lab 08/25/14 2000 08/28/14 1530 08/29/14 0526  NA 138 138 132*  K 3.9 3.7 4.1  CL 96* 97* 92*  CO2 28 27 28   GLUCOSE 139* 303* 333*  BUN 17 20 16   CREATININE 7.01* 8.67* 4.88*  CALCIUM 9.5 9.2 9.0  PHOS  --  6.2*  --    Liver Function Tests:  Recent Labs Lab 08/28/14 1530  ALBUMIN 3.0*   CBC:  Recent Labs Lab 08/25/14 2000 08/28/14 1530 08/29/14 1515  WBC 5.9 6.1 16.4*  HGB 11.9* 11.1* 11.9*  HCT 37.6 34.4* 36.9  MCV 98.2 97.7 98.1  PLT 142* 122* 159   CBG:  Recent Labs Lab 08/28/14 1141 08/28/14 2017 08/29/14 0810 08/29/14 1136  GLUCAP 204* 196* 363* 347*    Scheduled Meds: . aspirin EC  81 mg Oral Daily  . benzonatate  100 mg Oral Q8H  . calcitRIOL  1 mcg Oral Q T,Th,Sa-HD  . calcium acetate  1,334 mg Oral q1800  . calcium acetate  667 mg Oral BID  . cinacalcet  30 mg Oral Q lunch  . feeding supplement (NEPRO CARB STEADY)  237 mL Oral BID BM  . gabapentin  300 mg Oral QHS   . insulin aspart  0-20 Units Subcutaneous TID WC  . insulin aspart  0-5 Units Subcutaneous QHS  . ipratropium-albuterol  3 mL Nebulization Q6H  . levothyroxine  150 mcg Oral QAC breakfast  . methylPREDNISolone (SOLU-MEDROL) injection  60 mg Intravenous Q12H  . metoprolol tartrate  25 mg Oral BID  . multivitamin  1 tablet Oral QHS  . sodium chloride  3 mL Intravenous Q12H  . tacrolimus  3 mg Oral BID  . verapamil  120 mg Oral QHS   Continuous Infusions:

## 2014-08-29 NOTE — Care Management Note (Signed)
Case Management Note  Patient Details  Name: [name] MRN: [id number] Date of Birth: 1955-11-18  Subjective/Objective:            CM following for progression and d/c planning.        Action/Plan: 08/29/2014 Noted referral for [facility] CHF screening, however will follow as fluid overload is thought to be due to change in pt dry weight and adjustment to HD treatment may correct this issue. Will continue to follow.   Expected Discharge Date:                  Expected Discharge Plan:  [place]  In-House Referral:  NA  Discharge planning Services  CM Consult  Post Acute Care Choice:    Choice offered to:     DME Arranged:    DME Agency:     HH Arranged:    HH Agency:     Status of Service:  In process, will continue to follow  Medicare Important Message Given:    Date Medicare IM Given:    Medicare IM give by:    Date Additional Medicare IM Given:    Additional Medicare Important Message give by:     If discussed at [place] of Stay Meetings, dates discussed:    Additional Comments:  Adron Bene, RN 08/29/2014, 1:35 PM

## 2014-08-30 ENCOUNTER — Encounter: Payer: Self-pay | Admitting: *Deleted

## 2014-08-30 ENCOUNTER — Other Ambulatory Visit: Payer: Self-pay | Admitting: Cardiovascular Disease

## 2014-08-30 ENCOUNTER — Other Ambulatory Visit: Payer: Self-pay | Admitting: Vascular Surgery

## 2014-08-30 DIAGNOSIS — I5023 Acute on chronic systolic (congestive) heart failure: Principal | ICD-10-CM

## 2014-08-30 LAB — GLUCOSE, CAPILLARY
GLUCOSE-CAPILLARY: 290 mg/dL — AB (ref 65–99)
GLUCOSE-CAPILLARY: 257 mg/dL — AB (ref 65–99)

## 2014-08-30 LAB — PROTIME-INR
INR: 4.55 — AB (ref 0.00–1.49)
Prothrombin Time: 41.8 seconds — ABNORMAL HIGH (ref 11.6–15.2)

## 2014-08-30 LAB — BASIC METABOLIC PANEL
ANION GAP: 14 (ref 5–15)
BUN: 19 mg/dL (ref 6–20)
CALCIUM: 9.7 mg/dL (ref 8.9–10.3)
CO2: 28 mmol/L (ref 22–32)
CREATININE: 4.14 mg/dL — AB (ref 0.44–1.00)
Chloride: 92 mmol/L — ABNORMAL LOW (ref 101–111)
GFR calc non Af Amer: 11 mL/min — ABNORMAL LOW (ref >60)
GFR, EST AFRICAN AMERICAN: 12 mL/min — AB (ref >60)
GLUCOSE: 226 mg/dL — AB (ref 65–99)
Potassium: 3.9 mmol/L (ref 3.5–5.1)
SODIUM: 134 mmol/L — AB (ref 135–145)

## 2014-08-30 MED ORDER — IPRATROPIUM-ALBUTEROL 0.5-2.5 (3) MG/3ML IN SOLN: 3.0000 mL | Freq: Four times a day (QID) | RESPIRATORY_TRACT | Status: DC | PRN

## 2014-08-30 MED ORDER — PREDNISONE 20 MG PO TABS: ORAL_TABLET | ORAL | Status: DC

## 2014-08-30 MED ORDER — TRAMADOL HCL 50 MG PO TABS: 50.0000 mg | ORAL_TABLET | Freq: Two times a day (BID) | ORAL | Status: DC | PRN

## 2014-08-30 MED ORDER — CALCITRIOL 0.5 MCG PO CAPS: 0.5000 ug | ORAL_CAPSULE | ORAL | Status: DC

## 2014-08-30 NOTE — Discharge Instructions (Signed)

## 2014-08-30 NOTE — Telephone Encounter (Signed)
Tramadol refill refused - noncardiac medication (of note, printed Rx refilled denoted in EPIC from 08/30/14)  Warfarin refill routed to [facility]

## 2014-08-30 NOTE — Patient Outreach (Signed)
[name]) Care Management  08/30/2014  [name] November 25, 1955 [id number]   Referral from [name], RN to assign [name] and SW, [name], [name],  [name], [name]

## 2014-08-30 NOTE — Progress Notes (Signed)
Pt ambulated length of hallway and back to room on RA. Sats 100% before ambulation, and maintained above 92% except for a brief 20 second dip of 88-89%. MD notified. Will continue to monitor.   Tyna Jaksch, RN

## 2014-08-30 NOTE — Progress Notes (Signed)
[facility] Progress Note  Assessment/Plan: 1. COPD exacerbation: per primary. On Room air. No WOB.  2. ESRD -T,T,S at [facility]. HD today. No heparin. 3. HTN/volume - Had HD yesterday. Net UF 2000. Post wt. 75.7. Wt this AM 77.1. Watch fld intake.  4. CKD Bone/Mineral: Ca 9.7 C Ca 10.5 Phos 6.2 On Ca Acetate binders, Calcitriol. Change Calcitriol to 0.5 mg TTS.  5. Anemia: HGB 11.9 Stable.  6. Nutrition: Albumin 3.0 Renal Diet, Renal vit. Add nephro. 7. DM: SS per primary. BS 337 this AM. On Solumedrol.  8. Immunosuppression: Continue Tacrolimus.   [name] NP-C 08/30/2014, 8:20 AM  [name] [phone]  Subjective:   "I feel a Lot better today". Reports no O2 use since last night. Up in chair, talkative and animated.   Objective Filed Vitals:   08/29/14 1907 08/29/14 2013 08/29/14 2048 08/30/14 0443  BP: 141/46  142/52 106/38  Pulse: 80 85 90 73  Temp: 98.8 F (37.1 C)  98.4 F (36.9 C) 97.9 F (36.6 C)  TempSrc: Oral  Oral Oral  Resp: 18 18 17 18   Weight:   77.1 kg (169 lb 15.6 oz)   SpO2: 100% 100% 98% 98%   Physical Exam General: Well nourished female, pleasant and talkative. Heart: S1,S2 RRR No M/G/R Lungs: Bilateral breath sounds decreased in bases essentially clear. O2 at 3L/M. No WOB. Agas pending Abdomen:soft nontender. Active BS Extremities: No LE edema. 1+ peripheral pulses. Neuro: A & O X4. Moves all extremities.  Dialysis Access: L thigh graft + bruit  Dialysis Orders: [place],T,T,S. 3 hours 45 minute. No Heparin. 2.0 K 2.25 Ca 1.0 Mg Linear Sodium, EDW 80.5 BFR 450 DNR Autoflow 1.5 Mircera 75 mcg last dose 08/22/14 Calcitriol 1.0 mcg PO T,T,S Venofer 50 mg IV weekly in HD (last dose 08/24/2014)  Additional Objective Labs: Basic Metabolic Panel:  Recent Labs Lab 08/28/14 1530 08/29/14 0526 08/30/14 0347  NA 138 132* 134*  K 3.7 4.1 3.9  CL 97* 92* 92*  CO2 27 28 28   GLUCOSE 303* 333* 226*  BUN 20  16 19   CREATININE 8.67* 4.88* 4.14*  CALCIUM 9.2 9.0 9.7  PHOS 6.2*  --   --    Liver Function Tests:  Recent Labs Lab 08/28/14 1530  ALBUMIN 3.0*   No results for input(s): LIPASE, AMYLASE in the last 168 hours. CBC:  Recent Labs Lab 08/25/14 2000 08/28/14 1530 08/29/14 1515  WBC 5.9 6.1 16.4*  HGB 11.9* 11.1* 11.9*  HCT 37.6 34.4* 36.9  MCV 98.2 97.7 98.1  PLT 142* 122* 159   Blood Culture    Component Value Date/Time   SDES BLOOD RIGHT HAND 07/04/2013 0024   SPECREQUEST BOTTLES DRAWN AEROBIC ONLY 1CC 07/04/2013 0024   CULT  07/04/2013 0024    NO GROWTH 5 DAYS Performed at [facility] 07/10/2013 FINAL 07/04/2013 0024    Cardiac Enzymes: No results for input(s): CKTOTAL, CKMB, CKMBINDEX, TROPONINI in the last 168 hours. CBG:  Recent Labs Lab 08/29/14 0810 08/29/14 1136 08/29/14 1907 08/29/14 2047 08/30/14 0736  GLUCAP 363* 347* 143* 275* 290*   Iron Studies: No results for input(s): IRON, TIBC, TRANSFERRIN, FERRITIN in the last 72 hours. @lablastinr3 @ Studies/Results: No results found. Medications:    aspirin EC  81 mg Oral Daily   benzonatate  100 mg Oral Q8H   calcitRIOL  1 mcg Oral Q T,Th,Sa-HD   calcium acetate  1,334 mg Oral q1800   calcium acetate  667 mg Oral BID   cinacalcet  30 mg Oral Q lunch   feeding supplement (NEPRO CARB STEADY)  237 mL Oral BID BM   gabapentin  300 mg Oral QHS   insulin aspart  0-20 Units Subcutaneous TID WC   insulin aspart  0-5 Units Subcutaneous QHS   levothyroxine  150 mcg Oral QAC breakfast   methylPREDNISolone (SOLU-MEDROL) injection  60 mg Intravenous Q12H   metoprolol tartrate  25 mg Oral BID   multivitamin  1 tablet Oral QHS   sodium chloride  3 mL Intravenous Q12H   tacrolimus  3 mg Oral BID   verapamil  120 mg Oral QHS

## 2014-08-30 NOTE — Progress Notes (Signed)
Pt provided with discharge instruction including information on follow up appointments, prescriptions, and reasons to call the MD. Pt verbalized understanding of all information. IV was dc'd without complication. Pt VSS. Pt escorted out via wheelchair by this RN.   Tyna Jaksch, RN

## 2014-08-30 NOTE — Discharge Summary (Signed)
Physician Discharge Summary  [name]:[id number] DOB: 10/02/1955 DOA: 08/28/2014  PCP: No PCP Per Patient  Admit date: 08/28/2014 Discharge date: 08/30/2014  Recommendations for Outpatient Follow-up:  1. Pt will need to follow up with PCP in 2-3 weeks post discharge 2. Please also check CBC to evaluate Hg and Hct levels 3. Please note that pt insisted on going home and her INR was still supra therapeutic 4. Pt advised to stop taking coumadin and to have INR checked on Monday as soon as possible so that she can be instructed on how to take Coumadin 5. PCP notified of the urgency to have PT/INR checked by Monday 09/04/2014  Discharge Diagnoses:  Active Problems:   COPD exacerbation   Acute respiratory failure   Acute on chronic systolic CHF (congestive heart failure)   Discharge Condition: Stable  Diet recommendation: Heart healthy diet discussed in details     Brief narrative:    59 y.o. 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 Patient presented with shortness of breath that started several days prior to this admission. Of note, pt was recently started on Prednisone taper pack but has never picked up the prescription.   Assessment/Plan:    Acute respiratory distress secondary to acute COPD exacerbation and acute on chronic diastolic CHF - better this AM - continue to keep on oxygen via [place] if needed - also continue steroids with planned tapering as outlined below  - volume control with HD   Acute encephalopathy - secondary to COPD outlined above  - more alert his AM and following commands appropriately - insists on going home, says its [birth year] birthday   Acute on chronic diastolic CHF, grade II - volume control with HD  Leukocytosis - WBC up from steroids, continue with tapering as outlined below    ESRD - HD TTS, appreciate nephrology team following  - tolerating HD well   HTN - reasonable inpatient control   Immunosuppression - Continue Tacrolimus.    Code Status: Full.  Family Communication: plan of care discussed with the patient Disposition Plan: Home   IV access:  Peripheral IV Left thigh graft with + bruit   Procedures and diagnostic studies:   Dg Chest 2 View 08/25/2014 Moderate congestive heart failure.   Medical Consultants:  None  Other Consultants:  Nephrology   IAnti-Infectives:   None        Discharge Exam: Filed Vitals:   08/30/14 0950  BP: 177/56  Pulse: 68  Temp: 97.8 F (36.6 C)  Resp: 18   Filed Vitals:   08/29/14 2013 08/29/14 2048 08/30/14 0443 08/30/14 0950  BP:  142/52 106/38 177/56  Pulse: 85 90 73 68  Temp:  98.4 F (36.9 C) 97.9 F (36.6 C) 97.8 F (36.6 C)  TempSrc:  Oral Oral Oral  Resp: 18 17 18 18   Weight:  77.1 kg (169 lb 15.6 oz)    SpO2: 100% 98% 98% 98%    General: Pt is alert, follows commands appropriately, not in acute distress Cardiovascular: Regular rate and rhythm, no rubs, no gallops Respiratory: Clear to auscultation bilaterally, no wheezing, no crackles, no rhonchi Abdominal: Soft, non tender, non distended, bowel sounds +, no guarding  Discharge Instructions  Discharge Instructions    Diet - low sodium heart healthy    Complete by:  As directed      Increase  activity slowly    Complete by:  As directed             Medication List    STOP taking these medications        warfarin 4 MG tablet  Commonly known as:  COUMADIN      TAKE these medications        albuterol 90 MCG/ACT inhaler  Commonly known as:  PROVENTIL,VENTOLIN  Inhale 1-2 puffs into the lungs every 6 (six) hours as needed for wheezing or shortness of breath.     aspirin EC 81 MG tablet  Take 81 mg by mouth daily.     calcium acetate 667 MG capsule  Commonly known as:  PHOSLO  Take 667-2,001  mg by mouth See admin instructions. Take 3 capsules (2001 mg) with meals and 1 capsule (667 mg) with snacks     cinacalcet 30 MG tablet  Commonly known as:  SENSIPAR  Take 30 mg by mouth daily.     insulin aspart 100 UNIT/ML injection  Commonly known as:  novoLOG  Inject 1-2 Units into the skin 3 (three) times daily as needed for high blood sugar (CBG>150). CBG < 150 = no insulin CBG 150-200 = 1 unit CBG 200-250 = 2 units     ipratropium-albuterol 0.5-2.5 (3) MG/3ML Soln  Commonly known as:  DUONEB  Take 3 mLs by nebulization every 6 (six) hours as needed. J44.1 code     levothyroxine 150 MCG tablet  Commonly known as:  SYNTHROID, LEVOTHROID  Take 150 mcg by mouth daily before breakfast.     metoprolol tartrate 25 MG tablet  Commonly known as:  LOPRESSOR  Take 25 mg by mouth 2 (two) times daily.     nitroGLYCERIN 0.4 MG SL tablet  Commonly known as:  NITROSTAT  Place 1 tablet (0.4 mg total) under the tongue every 5 (five) minutes as needed for chest pain.     ondansetron 4 MG disintegrating tablet  Commonly known as:  ZOFRAN-ODT  Take 4 mg by mouth every 8 (eight) hours as needed for nausea or vomiting.     predniSONE 20 MG tablet  Commonly known as:  DELTASONE  Take 50 mg tablet today and taper down by 10 mg daily until completed     tacrolimus 1 MG capsule  Commonly known as:  PROGRAF  Take 3 mg by mouth 2 (two) times daily.     traMADol 50 MG tablet  Commonly known as:  ULTRAM  Take 1 tablet (50 mg total) by mouth every 12 (twelve) hours as needed for moderate pain.     verapamil 120 MG tablet  Commonly known as:  CALAN  Take 1 tablet (120 mg total) by mouth at bedtime.            Follow-up Information    Call Faye Ramsay, MD.   Specialty:  Internal Medicine   Why:  As needed call my cell phone [phone]   Contact information:   [address] [place] [address] [phone]        The results of significant diagnostics  from this hospitalization (including imaging, microbiology, ancillary and laboratory) are listed below for reference.     Microbiology: No results found for this or any previous visit (from the past 240 hour(s)).   Labs: Basic Metabolic Panel:  Recent Labs Lab 08/25/14 2000 08/28/14 1530 08/29/14 0526 08/30/14 0347  NA 138 138 132* 134*  K 3.9 3.7 4.1 3.9  CL 96* 97* 92* 92*  CO2 28 27 28 28   GLUCOSE 139* 303* 333* 226*  BUN 17 20 16 19   CREATININE 7.01* 8.67* 4.88* 4.14*  CALCIUM 9.5 9.2 9.0 9.7  PHOS  --  6.2*  --   --    Liver Function Tests:  Recent Labs Lab 08/28/14 1530  ALBUMIN 3.0*   CBC:  Recent Labs Lab 08/25/14 2000 08/28/14 1530 08/29/14 1515  WBC 5.9 6.1 16.4*  HGB 11.9* 11.1* 11.9*  HCT 37.6 34.4* 36.9  MCV 98.2 97.7 98.1  PLT 142* 122* 159   BNP: BNP (last 3 results)  Recent Labs  06/09/14 0444 06/17/14 1103 08/25/14 2000  BNP 351.9* 1109.3* 1999.3*    ProBNP (last 3 results)  Recent Labs  01/02/14 2318  PROBNP 33098.0*    CBG:  Recent Labs Lab 08/29/14 0810 08/29/14 1136 08/29/14 1907 08/29/14 2047 08/30/14 0736  GLUCAP 363* 347* 143* 275* 290*     SIGNED: Time coordinating discharge:  30 minutes  MAGICK-Rhett Mutschler, MD  [facility] 08/30/2014, 9:54 AM Pager [phone]  If 7PM-7AM, please contact night-coverage [email or web address] Password [email or web address]

## 2014-08-30 NOTE — Consult Note (Signed)
   [facility] Inpatient Consult   08/30/2014  [name] [birth year] [id number]   Patient evaluated for [place] Management services. Confirmed her Primary Care MD is Dr Helene Kelp of [facility] in [name], [place]. Spoke with patient just prior to her being discharged today. She reports today is her birthday. Explained [place] Management services. Patient agreeable. Consents obtained. Explained to her that she will receive post hospital discharge transition of care calls and will be evaluated for monthly home visits. Provided [facility] Care Management packet and contact information. Patient endorses she sometimes has issues with transportation to MD appointments. She also states she could use assist with household tasks. Discussed whether she had Medicaid. She reports she does not qualify for Medicaid. Will request for patient to be assigned to [place] for disease management and [facility] Licensed CSW for community resources. Made inpatient RNCM aware of visit. Confirmed telephone number as [phone].  [name], MSN-Ed, RN,BSN [facility] Liaison [phone]

## 2014-08-30 NOTE — Care Management Important Message (Signed)
Important Message  Patient Details  Name: [name] MRN: [id number] Date of Birth: 03/17/1955   Medicare Important Message Given:  Yes-second notification given    Delorse Lek 08/30/2014, 1:10 PM

## 2014-08-30 NOTE — Progress Notes (Signed)
ANTICOAGULATION CONSULT NOTE - Follow-Up Consult  Pharmacy Consult for Warfarin Indication: mechanical AVR  Allergies  Allergen Reactions  . Acetaminophen Other (See Comments)    Liver transplant recipient   . Codeine Itching  . Mirtazapine Other (See Comments)    hallucination  . Morphine Itching and Other (See Comments)    hallucinate    Patient Measurements: Weight: 169 lb 15.6 oz (77.1 kg) Heparin Dosing Weight: n/a  Vital Signs: Temp: 97.9 F (36.6 C) (07/20 0443) Temp Source: Oral (07/20 0443) BP: 106/38 mmHg (07/20 0443) Pulse Rate: 73 (07/20 0443)  Labs:  Recent Labs  08/28/14 1515 08/28/14 1530 08/29/14 0526 08/29/14 1515 08/30/14 0347  HGB  --  11.1*  --  11.9*  --   HCT  --  34.4*  --  36.9  --   PLT  --  122*  --  159  --   LABPROT 38.7*  --  36.0*  --  41.8*  INR 4.10*  --  3.72*  --  4.55*  CREATININE  --  8.67* 4.88*  --  4.14*    Estimated Creatinine Clearance: 14.7 mL/min (by C-G formula based on Cr of 4.14).   Medical History: Past Medical History  Diagnosis Date  . S/P liver transplant     2011 at [facility] (cirrhosis due to hep C, got hep C from blood transfuion in 1980's per pt))  . Chronic back pain   . CAD (coronary artery disease)   . Obesity   . Peripheral vascular disease hands and legs  . Anxiety   . Asthma   . GERD (gastroesophageal reflux disease)     takes Omeprazole daily  . Chronic constipation     takes MIralax and Colace daily  . Anemia     takes Folic Acid daily  . Hypothyroidism     takes Synthroid daily  . Depression     takes Cymbalta for "severe" depression  . Neuromuscular disorder     carpal tunnel in right hand  . Hypertension     takes Metoprolol and Lisinopril daily, sees Dr Bea Graff  . COPD (chronic obstructive pulmonary disease)   . Pneumonia     "today and several times before" (08/30/2012)  . Chronic bronchitis     "q yr w/season changes" (08/30/2012)  . Type II diabetes mellitus     Levemir 2units  daily if > 150  . History of blood transfusion     "several" (08/30/2012)  . Hepatitis C   . Migraine     "last migraine was in 2013" (08/30/2012)  . Headache     "at least monthly" (08/30/2012)  . Arthritis     "left hand, back" (08/30/2012)  . End stage renal disease on dialysis 02/27/2011    Kidneys shut down at time of liver transplant in Sept 2011 at [facility] in [place], she has been on HD ever since.  Dialyzes at [facility] HD on TTS schedule.  Had L forearm graft used 10 months then removed Dec 2012 due to suspected infection.  A right upper arm AVG was placed Dec 2012 but she developed steal symptoms acutely and it was ligated the same day.  Never had an AV fistula due to small veins.  Now has L thigh AVG put in Jan 2013, has not clotted to date.    [name] CAD (coronary artery disease) Jan. 2015    Cath: 20% LAD, 50% D1; s/p LIMA-LAD  . Aortic stenosis   . Tobacco abuse   .  S/P aortic valve replacement 03/15/13    Mechanical   . SVT (supraventricular tachycardia) 06/09/14    Assessment: 59 yo female with ESRD admitted with dyspnea.  Also on chronic Coumadin for mechanical AVR.  Pharmacy asked to continue Coumadin while she is here.  Last outpatient INR 3.1 on 7/14 and dose was decreased.  Today's INR 4.55, has not received any Coumadin since admission.  Goal of Therapy:  INR goal 2.5-3 per Coumadin clinic note Monitor platelets by anticoagulation protocol: Yes   Plan:  1. No Coumadin tonight. 2. Daily PT/INR.  Uvaldo Rising, BCPS  Clinical Pharmacist Pager [phone]  08/30/2014 7:32 AM

## 2014-08-31 ENCOUNTER — Other Ambulatory Visit: Payer: Self-pay

## 2014-08-31 DIAGNOSIS — I359 Nonrheumatic aortic valve disorder, unspecified: Secondary | ICD-10-CM | POA: Diagnosis not present

## 2014-08-31 LAB — PROTIME-INR: INR: 5 — AB (ref 0.9–1.1)

## 2014-08-31 MED ORDER — IPRATROPIUM-ALBUTEROL 0.5-2.5 (3) MG/3ML IN SOLN: 3.0000 mL | Freq: Four times a day (QID) | RESPIRATORY_TRACT | Status: DC | PRN

## 2014-08-31 NOTE — Patient Outreach (Signed)
Initial transition of care call: Spoke with patient who is at dialysis. Reports that she can talk in dialysis.  Patient reports that dialysis is from 6am-11am Tues, Thursday, and Saturday.  Patient reports that she does not know if it was her COPD or heart failure that has caused all of her shortness of breath. Reports today that breathing is better.   Reviewed all medications via phone. Patient reports that she does not have her duo neb because she does not have a nebulizer machine. Reports that she has all of her other medications.    Patient reports that she has problems with transportation and would like some assistance at home. Reports that [facility] social worker has also called her today.  Patient reports that she would like a home health nurse to help her if possible.   Discussed follow up with primary MD. Patient reports that Dr. Helene Kelp is new to her. States that she saw Dr. Helene Kelp about 1 month ago. Encouraged patient to call today and make a hospital follow up appointment.  Patient states that she will.     Patient reports that she lives with her daughter but is home alone most of the time because daughter works 2 jobs.  Plan:  Placed call to [name] ( hospital liaison) to discuss need or home nebulizer order and home health RN.  [name] has confirmed that orders have been placed with [place].  I placed call to patient and informed her of new orders. Patient was very thankful.  THN will continue weekly transition of care calls and assess needs for home visit. Primary goal at this time is to avoid readmissions. Will send this note to MD.   [facility] CM Care Plan Problem Two        Patient Outreach Telephone from 08/31/2014 in [place] Problem Two  Patient with recent admission related to heart failure.   Care Plan for Problem Two  Active   Interventions for Problem Two Long Term Goal   Reviewed importance of diet, follow up MD appointments, medication  compliance, and attending all dialysis appointments.   THN Long Term Goal (31-90) days  Patient will be able to verbalize no readmissions in the next 31 days.   THN Long Term Goal Start Date  08/31/14   THN CM Short Term Goal #1 (0-30 days)  Patient will be able to report follow up with primary MD in 10 days,   THN CM Short Term Goal #1 Start Date  08/31/14   Interventions for Short Term Goal #2   Discussed importance of hospital follow up with patient. Patient has agreed to call and get MD appointment.   THN CM Short Term Goal #2 (0-30 days)  Patient will be able to report that she has her nebulizer equipment and home health RN in 7 days   [facility] CM Short Term Goal #2 Start Date  08/31/14   Interventions for Short Term Goal #2  Called [name] ( hospital lisaon) to secure orders for RN and neb machine. Patient was informed.      [name], RN, BSN, CEN [facility] [employer] [phone]

## 2014-08-31 NOTE — Care Management Note (Signed)
Case Management Note  Patient Details  Name: ANEITA KIGER MRN: [id number] Date of Birth: Sep 19, 1955  Subjective/Objective:                    Action/Plan:  Pt d/c 08/30/2014 , no HH needs identified at that time. However pt followed by [facility], who spoke with the pt this am and she seemed somewhat confused re her meds. She had also d/c to home with prescriptions for meds to be used in nebulizer however had not had the nebulizer delivered to the room prior to her d/c. This CM contacted [facility] re the order which the MD placed 08/30/14 for the neb and informed them that the pt had d/c without the neb. Per Awanda Mink with [facility] the neb will be delivered to the pt home. Upon the recommendation on THN a HHRN has been ordered to eval the pt in her home for medication compliance and ability to use the nebulized once it is delivered.   Expected Discharge Date:       Pt d/c 08/30/2014           Expected Discharge Plan:  [place]  In-House Referral:  NA  Discharge planning Services  CM Consult  Post Acute Care Choice:  Durable Medical Equipment Choice offered to:     DME Arranged:  Nebulizer machine DME Agency:  [place] Arranged:  RN [facility] Agency:  [place]  Status of Service:  Completed, signed off  Medicare Important Message Given:  Yes-second notification given Date Medicare IM Given:    Medicare IM give by:    Date Additional Medicare IM Given:    Additional Medicare Important Message give by:     If discussed at [place] of Stay Meetings, dates discussed:    Additional Comments:  Adron Bene, RN 08/31/2014, 12:40 PM

## 2014-09-01 ENCOUNTER — Ambulatory Visit (INDEPENDENT_AMBULATORY_CARE_PROVIDER_SITE_OTHER): Payer: Medicare Other | Admitting: Pharmacist Clinician (PhC)/ Clinical Pharmacy Specialist

## 2014-09-01 DIAGNOSIS — Z952 Presence of prosthetic heart valve: Secondary | ICD-10-CM

## 2014-09-01 DIAGNOSIS — Z954 Presence of other heart-valve replacement: Secondary | ICD-10-CM

## 2014-09-01 DIAGNOSIS — Z7901 Long term (current) use of anticoagulants: Secondary | ICD-10-CM

## 2014-09-03 DIAGNOSIS — N186 End stage renal disease: Secondary | ICD-10-CM | POA: Diagnosis not present

## 2014-09-03 DIAGNOSIS — D649 Anemia, unspecified: Secondary | ICD-10-CM | POA: Diagnosis not present

## 2014-09-03 DIAGNOSIS — I5023 Acute on chronic systolic (congestive) heart failure: Secondary | ICD-10-CM | POA: Diagnosis not present

## 2014-09-03 DIAGNOSIS — F329 Major depressive disorder, single episode, unspecified: Secondary | ICD-10-CM | POA: Diagnosis not present

## 2014-09-03 DIAGNOSIS — Z794 Long term (current) use of insulin: Secondary | ICD-10-CM | POA: Diagnosis not present

## 2014-09-03 DIAGNOSIS — E1122 Type 2 diabetes mellitus with diabetic chronic kidney disease: Secondary | ICD-10-CM | POA: Diagnosis not present

## 2014-09-03 DIAGNOSIS — J441 Chronic obstructive pulmonary disease with (acute) exacerbation: Secondary | ICD-10-CM | POA: Diagnosis not present

## 2014-09-03 DIAGNOSIS — I2510 Atherosclerotic heart disease of native coronary artery without angina pectoris: Secondary | ICD-10-CM | POA: Diagnosis not present

## 2014-09-03 DIAGNOSIS — I120 Hypertensive chronic kidney disease with stage 5 chronic kidney disease or end stage renal disease: Secondary | ICD-10-CM | POA: Diagnosis not present

## 2014-09-03 DIAGNOSIS — Z720 Tobacco use: Secondary | ICD-10-CM | POA: Diagnosis not present

## 2014-09-03 DIAGNOSIS — Z992 Dependence on renal dialysis: Secondary | ICD-10-CM | POA: Diagnosis not present

## 2014-09-03 DIAGNOSIS — Z944 Liver transplant status: Secondary | ICD-10-CM | POA: Diagnosis not present

## 2014-09-03 DIAGNOSIS — I739 Peripheral vascular disease, unspecified: Secondary | ICD-10-CM | POA: Diagnosis not present

## 2014-09-03 DIAGNOSIS — J45909 Unspecified asthma, uncomplicated: Secondary | ICD-10-CM | POA: Diagnosis not present

## 2014-09-05 ENCOUNTER — Other Ambulatory Visit: Payer: Self-pay | Admitting: Licensed Clinical Social Worker

## 2014-09-05 DIAGNOSIS — I359 Nonrheumatic aortic valve disorder, unspecified: Secondary | ICD-10-CM | POA: Diagnosis not present

## 2014-09-05 LAB — PROTIME-INR: INR: 3 — AB (ref 0.9–1.1)

## 2014-09-05 NOTE — Patient Outreach (Signed)
[facility]) Care Management  09/05/2014  [name] 06-03-1955 [id number]   Assessment-CSW received referral from [name], [facility] Liaison due to patient needing community resources and possible transportation services. CSW contacted patient after dialysis appointment but was unable to reach patient. CSW left HIPPA compliant voice message asking for patient to return call.  Plan-CSW will make second attempt outreach in a few days.  Eula Fried, BSW, MSW, [email or web address] Phone: [phone] Fax: [phone]

## 2014-09-06 ENCOUNTER — Ambulatory Visit (INDEPENDENT_AMBULATORY_CARE_PROVIDER_SITE_OTHER): Payer: Medicare Other | Admitting: Pharmacist Clinician (PhC)/ Clinical Pharmacy Specialist

## 2014-09-06 DIAGNOSIS — Z954 Presence of other heart-valve replacement: Secondary | ICD-10-CM

## 2014-09-06 DIAGNOSIS — I2510 Atherosclerotic heart disease of native coronary artery without angina pectoris: Secondary | ICD-10-CM | POA: Diagnosis not present

## 2014-09-06 DIAGNOSIS — E1122 Type 2 diabetes mellitus with diabetic chronic kidney disease: Secondary | ICD-10-CM | POA: Diagnosis not present

## 2014-09-06 DIAGNOSIS — N186 End stage renal disease: Secondary | ICD-10-CM | POA: Diagnosis not present

## 2014-09-06 DIAGNOSIS — Z952 Presence of prosthetic heart valve: Secondary | ICD-10-CM

## 2014-09-06 DIAGNOSIS — I5023 Acute on chronic systolic (congestive) heart failure: Secondary | ICD-10-CM | POA: Diagnosis not present

## 2014-09-06 DIAGNOSIS — J441 Chronic obstructive pulmonary disease with (acute) exacerbation: Secondary | ICD-10-CM | POA: Diagnosis not present

## 2014-09-06 DIAGNOSIS — I120 Hypertensive chronic kidney disease with stage 5 chronic kidney disease or end stage renal disease: Secondary | ICD-10-CM | POA: Diagnosis not present

## 2014-09-06 DIAGNOSIS — Z7901 Long term (current) use of anticoagulants: Secondary | ICD-10-CM

## 2014-09-07 ENCOUNTER — Other Ambulatory Visit: Payer: Self-pay

## 2014-09-07 ENCOUNTER — Other Ambulatory Visit: Payer: Self-pay | Admitting: Licensed Clinical Social Worker

## 2014-09-07 DIAGNOSIS — E118 Type 2 diabetes mellitus with unspecified complications: Secondary | ICD-10-CM | POA: Diagnosis not present

## 2014-09-07 DIAGNOSIS — J449 Chronic obstructive pulmonary disease, unspecified: Secondary | ICD-10-CM | POA: Diagnosis not present

## 2014-09-07 DIAGNOSIS — I359 Nonrheumatic aortic valve disorder, unspecified: Secondary | ICD-10-CM | POA: Diagnosis not present

## 2014-09-07 NOTE — Patient Outreach (Signed)
[place] [facility]) Care Management  09/07/2014  [name] March 05, 1955 [id number]   Assessment-Patient returned call from [place]. HIPPA verifications were received. Patient reports that she has trouble getting to medical appointments in both [place] and [place]. CSW questioned if patient has used [facility] before. Patient states that she is not eligible for [facility] because she does not have medicaid and is not eligible for medicaid. CSW informs patient that she will look into other community resources that can help her gain stable transportation. Patient reports that she no longer needs assistance with home aid nurse as she has someone now who comes to her residence 2x per week for a few hours. Patient reports that she currently does not need any more hours than what she is receiving.  Plan-CSW will outreach to community resources in order to aid patient in transportation.          CSW will contact patient with updates next week.  Eula Fried, BSW, MSW, [email or web address] Phone: [phone] Fax: [phone]

## 2014-09-07 NOTE — Patient Outreach (Signed)
Transition of care call week 2. Patient reports that she is doing well. Feeling tired. Currently at dialysis.  Reports that [facility] brought her nebulizer and that she has a home health nurse now.  Patient thinks this will help her.  Patient reports that she has to call the social worker back. Reports that she has the social workers phone number.  Patient denies any other needs at this time.  PLAN: Will continue transition of care calls weekly.   [facility] CM Care Plan Problem Two        Patient Outreach Telephone from 09/07/2014 in [place] Problem Two  Patient with recent admission related to heart failure.   Care Plan for Problem Two  Active   Interventions for Problem Two Long Term Goal   Reviewed importance of diet, follow up MD appointments, medication compliance, and attending all dialysis appointments.   THN Long Term Goal (31-90) days  Patient will be able to verbalize no readmissions in the next 31 days.   THN Long Term Goal Start Date  08/31/14   THN CM Short Term Goal #1 (0-30 days)  Patient will be able to report follow up with primary MD in 10 days,   THN CM Short Term Goal #1 Start Date  08/31/14   Interventions for Short Term Goal #2   Reviewed importance of hospital follow up with patient. Patient has agreed to call and get MD appointment.   THN CM Short Term Goal #2 (0-30 days)  Patient will be able to report that she has her nebulizer equipment and home health RN in 7 days   THN CM Short Term Goal #2 Start Date  08/31/14   [facility] CM Short Term Goal #2 Met Date  09/07/14 Barrie Folk met: confirms that she has her equipment]   Interventions for Short Term Goal #2  Called [name] ( hospital lisaon) to secure orders for RN and neb machine. Patient was informed.     [name], RN, BSN, CEN [facility] [employer] [phone]

## 2014-09-08 ENCOUNTER — Encounter: Payer: Self-pay | Admitting: Cardiovascular Disease

## 2014-09-08 DIAGNOSIS — R0600 Dyspnea, unspecified: Secondary | ICD-10-CM | POA: Diagnosis not present

## 2014-09-08 DIAGNOSIS — D649 Anemia, unspecified: Secondary | ICD-10-CM | POA: Diagnosis not present

## 2014-09-10 DIAGNOSIS — Z992 Dependence on renal dialysis: Secondary | ICD-10-CM | POA: Diagnosis not present

## 2014-09-10 DIAGNOSIS — N186 End stage renal disease: Secondary | ICD-10-CM | POA: Diagnosis not present

## 2014-09-10 DIAGNOSIS — T8640 Unspecified complication of liver transplant: Secondary | ICD-10-CM | POA: Diagnosis not present

## 2014-09-11 ENCOUNTER — Other Ambulatory Visit: Payer: Self-pay | Admitting: Licensed Clinical Social Worker

## 2014-09-11 DIAGNOSIS — E1122 Type 2 diabetes mellitus with diabetic chronic kidney disease: Secondary | ICD-10-CM | POA: Diagnosis not present

## 2014-09-11 DIAGNOSIS — J441 Chronic obstructive pulmonary disease with (acute) exacerbation: Secondary | ICD-10-CM | POA: Diagnosis not present

## 2014-09-11 DIAGNOSIS — I2510 Atherosclerotic heart disease of native coronary artery without angina pectoris: Secondary | ICD-10-CM | POA: Diagnosis not present

## 2014-09-11 DIAGNOSIS — N186 End stage renal disease: Secondary | ICD-10-CM | POA: Diagnosis not present

## 2014-09-11 DIAGNOSIS — I120 Hypertensive chronic kidney disease with stage 5 chronic kidney disease or end stage renal disease: Secondary | ICD-10-CM | POA: Diagnosis not present

## 2014-09-11 DIAGNOSIS — I5023 Acute on chronic systolic (congestive) heart failure: Secondary | ICD-10-CM | POA: Diagnosis not present

## 2014-09-11 NOTE — Patient Outreach (Signed)
[facility]) Care Management  09/11/2014  JAYLANIE BOSCHEE 1955-02-21 [id number]   Assessment-CSW contacted [facility] transportation services and successfully referred patient with previous permission from patient. CSW then contacted patient via phone and received HIPPA verifications. CSW informed her that [facility] has approved patient for their transportation services. CSW provided important information such as: contact number and how to make scheduled transportation appointment, that patient must call 2-3 days in advance to schedule transportation for her appointments, [facility] only goes to [place] on Tuesdays and the price for transportation is $2.00 dollars within the city each way and back and outside the city $3.00 dollars. Patient was very appreciative of CSW setting up transportation. CSW questioned if patient had any other SW needs and patient declined stating that transportation was her only need at the time. CSW informed patient that her goal has been met and that CSW will be signing off. CSW informed her that her case can reopen to CSW if ever needed. Patient reports her gratitude for [facility] program.  Plan-CSW will close patient. CSW will inform RNCM [name] and CMA Lurline Del of case closure in the form of an in basket message.   Eula Fried, BSW, MSW, [email or web address] Phone: [phone] Fax: [phone]

## 2014-09-12 ENCOUNTER — Other Ambulatory Visit: Payer: Self-pay

## 2014-09-12 DIAGNOSIS — I5023 Acute on chronic systolic (congestive) heart failure: Secondary | ICD-10-CM | POA: Diagnosis not present

## 2014-09-12 DIAGNOSIS — J441 Chronic obstructive pulmonary disease with (acute) exacerbation: Secondary | ICD-10-CM | POA: Diagnosis not present

## 2014-09-12 DIAGNOSIS — E1122 Type 2 diabetes mellitus with diabetic chronic kidney disease: Secondary | ICD-10-CM | POA: Diagnosis not present

## 2014-09-12 DIAGNOSIS — I359 Nonrheumatic aortic valve disorder, unspecified: Secondary | ICD-10-CM | POA: Diagnosis not present

## 2014-09-12 DIAGNOSIS — N186 End stage renal disease: Secondary | ICD-10-CM | POA: Diagnosis not present

## 2014-09-12 DIAGNOSIS — N2581 Secondary hyperparathyroidism of renal origin: Secondary | ICD-10-CM | POA: Diagnosis not present

## 2014-09-12 DIAGNOSIS — E118 Type 2 diabetes mellitus with unspecified complications: Secondary | ICD-10-CM | POA: Diagnosis not present

## 2014-09-12 DIAGNOSIS — E876 Hypokalemia: Secondary | ICD-10-CM | POA: Diagnosis not present

## 2014-09-12 LAB — PROTIME-INR: INR: 4.6 — AB (ref 0.9–1.1)

## 2014-09-12 NOTE — Patient Outreach (Signed)
Transition of care call:  Placed call to patient who was at dialysis.  Reports her weight today before treatment was 79.2 kilos. Reports today's CBG was 126.   Patient reports that her treatment went well. States that she is feeling better. Reports that home health remains active.   Patient denies any new problems or concerns.  Reports that social worker helped her with transportation.   Offered home visit and patient has accepted.  Home visit planned for August 8th.  Confirmed address.  Plan: Home visit on August 8th.  [name], RN, BSN, CEN [facility] [employer] [phone]

## 2014-09-14 ENCOUNTER — Ambulatory Visit (INDEPENDENT_AMBULATORY_CARE_PROVIDER_SITE_OTHER): Payer: Medicare Other | Admitting: Pharmacist Clinician (PhC)/ Clinical Pharmacy Specialist

## 2014-09-14 DIAGNOSIS — Z952 Presence of prosthetic heart valve: Secondary | ICD-10-CM

## 2014-09-14 DIAGNOSIS — Z954 Presence of other heart-valve replacement: Secondary | ICD-10-CM

## 2014-09-14 DIAGNOSIS — Z7901 Long term (current) use of anticoagulants: Secondary | ICD-10-CM

## 2014-09-15 DIAGNOSIS — E1122 Type 2 diabetes mellitus with diabetic chronic kidney disease: Secondary | ICD-10-CM | POA: Diagnosis not present

## 2014-09-15 DIAGNOSIS — I120 Hypertensive chronic kidney disease with stage 5 chronic kidney disease or end stage renal disease: Secondary | ICD-10-CM | POA: Diagnosis not present

## 2014-09-15 DIAGNOSIS — J441 Chronic obstructive pulmonary disease with (acute) exacerbation: Secondary | ICD-10-CM | POA: Diagnosis not present

## 2014-09-15 DIAGNOSIS — I2510 Atherosclerotic heart disease of native coronary artery without angina pectoris: Secondary | ICD-10-CM | POA: Diagnosis not present

## 2014-09-15 DIAGNOSIS — N186 End stage renal disease: Secondary | ICD-10-CM | POA: Diagnosis not present

## 2014-09-15 DIAGNOSIS — I5023 Acute on chronic systolic (congestive) heart failure: Secondary | ICD-10-CM | POA: Diagnosis not present

## 2014-09-18 ENCOUNTER — Other Ambulatory Visit: Payer: Self-pay

## 2014-09-18 VITALS — BP 138/60 | HR 73 | Resp 16 | Ht 65.0 in | Wt 169.0 lb

## 2014-09-18 DIAGNOSIS — I5032 Chronic diastolic (congestive) heart failure: Secondary | ICD-10-CM

## 2014-09-18 DIAGNOSIS — J441 Chronic obstructive pulmonary disease with (acute) exacerbation: Secondary | ICD-10-CM

## 2014-09-18 NOTE — Patient Outreach (Signed)
[place] [facility]) Care Management   09/18/2014  FALISA LAMORA 1955-03-31 [id number]  MALAYIAH MCBRAYER is an 59 y.o. female Arrived for initial home visit at 10 am. Patient answered door without difficulty. Subjective: Patient reports that she feels like she is getting stronger since her last hospital admission. Reports that her breathing is improved.  Patient lives with daughter who assist patient as needed.  Kidney Failure:  Patient reports that she goes to dialysis 3 times per week. Reports her times are 6:20 am to 11 am.  Patient is able to drive herself if she can not find anyone else to take her.  Patient reports that she is often washed out after a treatment.  Patient reports that she takes her medications as prescribed without problems. Reports that she is able to manage the cost of her medications.   COPD: Patient reports that her breathing is better. Uses her inhalers as needed. Has a nebulizer machine that she also uses if needed. Reports that she has started walking 3 times per week and is now less short of breath when walking. Reports that she is currently smoking 1 pack per 4 days. Is interested in smoking cessation program. DM: Reports that she checks her CBG daily. Reports she has not had to take insulin since hospital discharge.  Reports that she used her CBG machine today and is unable to locate machine during home visit. Heart Failure: Reports that she does not weigh daily because of dialysis. Reports that she is on a fluid restriction of 32 ounces per day. Reports following her low salt diet.  Reports that she will weigh is she notices swelling or shortness of breath.   Objective:   Home alone during home visit. Home is neat and clean. No evidence of safety concerns. Patient is able to ambulate during home visit without difficulty. Filed Vitals:   09/18/14 1020  BP: 138/60  Pulse: 73  Resp: 16  Height: 1.651 m (5' 5" )  Weight: 169 lb (76.658 kg)  SpO2: 98%    Review of Systems  Constitutional: Negative.   HENT:       Wears dentures  Respiratory: Negative for cough, shortness of breath and wheezing.   Cardiovascular: Negative for leg swelling.  Genitourinary:       Denies any urinary output  Musculoskeletal: Negative.        Last fall in February.  Neurological: Negative.   Endo/Heme/Allergies: Negative.   Psychiatric/Behavioral: Positive for depression. Negative for suicidal ideas, hallucinations, memory loss and substance abuse. The patient is not nervous/anxious and does not have insomnia.     Physical Exam  Constitutional: She is oriented to person, place, and time. She appears well-developed and well-nourished.  Cardiovascular: Normal rate, regular rhythm and intact distal pulses.   Respiratory: Effort normal and breath sounds normal. No respiratory distress.  Respirations even and unlabored.  Musculoskeletal: Normal range of motion. She exhibits no edema.  Neurological: She is alert and oriented to person, place, and time.  Skin: Skin is warm and dry.  Psychiatric: She has a normal mood and affect. Her behavior is normal. Judgment and thought content normal.  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 No bleeding. Feet are without any areas of broken skin.   Current Medications:   Current Outpatient Prescriptions  Medication Sig Dispense Refill  . albuterol (PROVENTIL,VENTOLIN) 90 MCG/ACT inhaler Inhale 1-2 puffs into the lungs every 6 (six) hours as needed for wheezing or shortness of breath.     [name]  aspirin EC 81 MG tablet Take 81 mg by mouth daily.     . calcium acetate (PHOSLO) 667 MG capsule Take 667-2,001 mg by mouth See admin instructions. Take 3 capsules (2001 mg) with meals and 1 capsule (667 mg) with snacks    . cinacalcet (SENSIPAR) 30 MG tablet Take 30 mg by mouth daily.    . insulin aspart (NOVOLOG) 100 UNIT/ML injection Inject 1-2 Units into the skin 3 (three) times daily as needed for high blood sugar (CBG>150). CBG < 150  = no insulin CBG 150-200 = 1 unit CBG 200-250 = 2 units    . ipratropium-albuterol (DUONEB) 0.5-2.5 (3) MG/3ML SOLN Take 3 mLs by nebulization every 6 (six) hours as needed. J44.1 code 360 mL 1  . levothyroxine (SYNTHROID, LEVOTHROID) 150 MCG tablet Take 150 mcg by mouth daily before breakfast.    . metoprolol tartrate (LOPRESSOR) 25 MG tablet Take 25 mg by mouth 2 (two) times daily.     . nitroGLYCERIN (NITROSTAT) 0.4 MG SL tablet Place 1 tablet (0.4 mg total) under the tongue every 5 (five) minutes as needed for chest pain. 30 tablet 0  . ondansetron (ZOFRAN-ODT) 4 MG disintegrating tablet Take 4 mg by mouth every 8 (eight) hours as needed for nausea or vomiting.    . tacrolimus (PROGRAF) 1 MG capsule Take 3 mg by mouth 2 (two) times daily.     . traMADol (ULTRAM) 50 MG tablet Take 1 tablet (50 mg total) by mouth every 12 (twelve) hours as needed for moderate pain. 60 tablet 0  . verapamil (CALAN) 120 MG tablet Take 1 tablet (120 mg total) by mouth at bedtime. 30 tablet 6  . warfarin (COUMADIN) 4 MG tablet Take 1 tablet by mouth daily or as directed by coumadin clinic 30 tablet 3  . predniSONE (DELTASONE) 20 MG tablet Take 50 mg tablet today and taper down by 10 mg daily until completed (Patient not taking: Reported on 09/18/2014) 15 tablet 0   No current facility-administered medications for this visit.    Functional Status:   In your present state of health, do you have any difficulty performing the following activities: 09/18/2014 08/28/2014  Hearing? N N  Vision? Y N  Difficulty concentrating or making decisions? N N  Walking or climbing stairs? Y Y  Dressing or bathing? - N  Doing errands, shopping? Tempie Donning  Preparing Food and eating ? N -  Using the Toilet? N -  In the past six months, have you accidently leaked urine? N -  Do you have problems with loss of bowel control? N -  Managing your Medications? N -  Managing your Finances? N -  Housekeeping or managing your Housekeeping? Y -     Fall/Depression Screening:    PHQ 2/9 Scores 09/18/2014  PHQ - 2 Score 3  PHQ- 9 Score 10   Fall Risk  09/18/2014  Falls in the past year? Yes  Number falls in past yr: 2 or more  Injury with Fall? Yes  Risk Factor Category  High Fall Risk  Risk for fall due to : History of fall(s)  Follow up Falls prevention discussed    Assessment:   (1) reviewed [facility] program with patient face to face. Reviewed signed consent from last hospital admission.  Patient denies any changes to be made on consent. (2) Reports having completed advanced directives. (3) Is actively smoking.  Reviewed with patient the importance of not smoking with COPD. Is interested in smoking cessation.  (  4) increase risk for falls since she has had 2 or 3 falls in the last year. Reviewed safety precautions.  Has a cane and a walker and uses these devices as needed. (5) positive depression screening. (6) interested in resources for new eye glasses.  (7) Patient reports DM is in good control, Reports she is working on heart failure and COPD. Reports that she want to stay out of the hospital. (8) Dialysis three time per week in [place].  Plan:  (1) consent in Environmental health practitioner (2) Has completed Advanced Directives (3) Referral to [place] for smoking cessation. (4) Patient to use walker and cane as needed. Reinforced safety precautions to prevent falls. (5) Will notify MD of positive depression screening.  Referral to [facility] social worker to assist with depression concerns. (6) referral to [place] worker for resources for glasses. (7) see care plan:  Education provided to early recognition of symptoms. Printed EMMI provided Heart Failure:  Home monitoring, how to be salt smart, when to call your doctor or 911.  COPD What you can do, Inhalers using them the right way,  DM: Controlling blood sugar, food and exercise, taking care of yourself day to day. (8) Patient will continue to attend dialysis as directed. Patient is  connected to resources at the dialysis center including a case manager and nutritional supplements.   Collaborative goal setting and care planning with patient. Priority goal is to avoid readmissions.   [facility] CM Care Plan Problem One        Patient Outreach from 09/18/2014 in [place] Problem One  Knowledge deficit related to COPD, Heart failure related to reducing admissions as evidence by 4 admissions in the last 6 months.   Care Plan for Problem One  Active   THN CM Short Term Goal #1 (0-30 days)  Patient will be able to report understanding of daily management and recognition of symptoms in the next 2 week.   THN CM Short Term Goal #1 Start Date  09/18/14   Interventions for Short Term Goal #1  Home visit completed. Reviewed symptoms of COPD and Heart failure. Reviewed COPD zones, Heart failure zones. Provided THN calandar with zones attached for easy reference of patient.  Printed EMMI provided and reviewed during home visit.    [facility] CM Care Plan Problem Two        Patient Outreach from 09/18/2014 in [place] Problem Two  Patient with recent admission related to heart failure.   Care Plan for Problem Two  Active   Interventions for Problem Two Long Term Goal   Reinforced importance of early recognition of symptoms of illness. Home visit completed today.   THN Long Term Goal (31-90) days  Patient will be able to verbalize no readmissions in the next 31 days.   THN Long Term Goal Start Date  08/31/14   THN CM Short Term Goal #1 (0-30 days)  Patient will be able to report follow up with primary MD in 10 days,   THN CM Short Term Goal #1 Start Date  08/31/14   [facility] CM Short Term Goal #1 Met Date   09/15/14 [Goal Met. Saw PCP on 8/5]   Interventions for Short Term Goal #2   Reviewed importance of hospital follow up with patient. Patient has agreed to call and get MD appointment.   THN CM Short Term Goal #2 (0-30 days)  Patient will be able to  report that  she has her nebulizer equipment and home health RN in 7 days   [facility] CM Short Term Goal #2 Start Date  08/31/14   [facility] CM Short Term Goal #2 Met Date  09/07/14 Barrie Folk met: confirms that she has her equipment]   Interventions for Short Term Goal #2  Called [name] ( hospital lisaon) to secure orders for RN and neb machine. Patient was informed.    [facility] CM Care Plan Problem Three        Patient Outreach Telephone from 09/11/2014 in [place] [facility] CM Short Term Goal #1 Met Date  09/11/14   Interventions for Short Term Goal #1  Goal has been met. CSW made referral to [facility] and provided patient with contact information to set up appointments.      Patient will continue to be followed in the transition of care program for 2 more weeks. Will reassess needs at end of transition of care program. Remains active with [place].  Referral made to [place] for smoking cessation. Referral to [place] worker for eyeglass resources and positive depression screening.   Copy of this home visit sent to MD including barrier letter.   [name], RN, BSN, CEN [facility] [employer] [phone]

## 2014-09-18 NOTE — Patient Outreach (Addendum)
[facility]) Care Management  09/18/2014  [name] November 15, 1955 [id number]   Request from [name], RN to assign SW and Pharmacy, assigned Eula Fried, LCSW and Deanne Coffer, PharmD.  Ronnell Freshwater. [place], [place] Management [place] Assistant Phone: [phone] Fax: [phone]

## 2014-09-19 DIAGNOSIS — I359 Nonrheumatic aortic valve disorder, unspecified: Secondary | ICD-10-CM | POA: Diagnosis not present

## 2014-09-19 LAB — PROTIME-INR: INR: 1.9 — AB (ref 0.9–1.1)

## 2014-09-20 ENCOUNTER — Ambulatory Visit: Payer: Medicare Other | Admitting: Cardiovascular Disease

## 2014-09-20 ENCOUNTER — Other Ambulatory Visit: Payer: Self-pay | Admitting: Licensed Clinical Social Worker

## 2014-09-20 DIAGNOSIS — I2510 Atherosclerotic heart disease of native coronary artery without angina pectoris: Secondary | ICD-10-CM | POA: Diagnosis not present

## 2014-09-20 DIAGNOSIS — I5023 Acute on chronic systolic (congestive) heart failure: Secondary | ICD-10-CM | POA: Diagnosis not present

## 2014-09-20 DIAGNOSIS — N186 End stage renal disease: Secondary | ICD-10-CM | POA: Diagnosis not present

## 2014-09-20 DIAGNOSIS — E1122 Type 2 diabetes mellitus with diabetic chronic kidney disease: Secondary | ICD-10-CM | POA: Diagnosis not present

## 2014-09-20 DIAGNOSIS — I120 Hypertensive chronic kidney disease with stage 5 chronic kidney disease or end stage renal disease: Secondary | ICD-10-CM | POA: Diagnosis not present

## 2014-09-20 DIAGNOSIS — J441 Chronic obstructive pulmonary disease with (acute) exacerbation: Secondary | ICD-10-CM | POA: Diagnosis not present

## 2014-09-20 NOTE — Patient Outreach (Addendum)
[facility]) Care Management  09/20/2014  [name] [birth year] [id number]   Assessment-CSW received referral again for patient by [facility] [name] due to patient's ongoing depression and needing financial assistance with gaining eye glasses. CSW contacted patient by phone and successfully reached her. HIPPA verifications were received. CSW read in previous note that patient reported still not having stable transportation. CSW questioned patient about if she had contacted [facility] and patient admitted that she lost their contact information. CSW provided information on [facility] including what days they travel and where, prices and how to set up transportation for an appointment. Patient stated that she will use [facility] for next medical appointment. CSW reminded her to schedule her [facility] appointments for Tuesdays as [facility] only travels to [place] then. Patient reported that she can not afford eye glasses as they would be over $300 dollars. CSW informed patient that she would refer her to the [facility] to get a free eye exam and glasses but will need to be put on a waiting list. Patient was agreeable to this. CSW questioned patient on her depressive symptoms and pyschosocial assessment was reviewed. Patient denies any SI/HI but admits to ongoing depression. Patient reports that she has not been to her psychiatrist in years and at one point she tried to make an appointment but that staff informed her that she would be considered a new patient and that were not taking new patients at that time. CSW figured out the psychiatrist's name by patient giving stress address. CSW informed patient that she could call and get information about readmitting into their program again. CSW also referred patient to [facility] in [place] that can provide her with psychiatric assistance.  Patient was agreeable to mental health and eye glass assistance goals for care plan. CSW informed patient  that she would be in touch. Patient was appreciative of services. CSW contacted previous psychiatrist Dr. Iona Beard office and the out of office voice message stated that they were out of office from 81/16-8/18/16. CSW then left voice message for [facility] office to make a referral.  Plan-CSW will await call from [employer]. CSW will await to contact previous psychiatrist's office on 09/28/14. CSW will remain available to patient for any other needs.  Eula Fried, BSW, MSW, [email or web address] Phone: [phone] Fax: [phone]

## 2014-09-21 ENCOUNTER — Other Ambulatory Visit: Payer: Self-pay | Admitting: Pharmacist

## 2014-09-21 ENCOUNTER — Ambulatory Visit (INDEPENDENT_AMBULATORY_CARE_PROVIDER_SITE_OTHER): Payer: Medicare Other | Admitting: Pharmacist Clinician (PhC)/ Clinical Pharmacy Specialist

## 2014-09-21 DIAGNOSIS — Z954 Presence of other heart-valve replacement: Secondary | ICD-10-CM

## 2014-09-21 DIAGNOSIS — Z952 Presence of prosthetic heart valve: Secondary | ICD-10-CM

## 2014-09-21 DIAGNOSIS — Z7901 Long term (current) use of anticoagulants: Secondary | ICD-10-CM

## 2014-09-21 NOTE — Patient Outreach (Signed)
[facility]) Care Management  09/21/2014  [name] Aug 04, 1955 [id number]  Patient was referred to [facility] Pharmacist from [place], [name], for assistance with smoking cessation.  I called [name] and she reports she is still interested in smoking cessation.  I set up an initial home visit for Monday, August 15th at 1:30 PM.    Elisabeth Most, Pharm.D. Pharmacy Resident [place]

## 2014-09-22 ENCOUNTER — Emergency Department (HOSPITAL_COMMUNITY)
Admission: EM | Admit: 2014-09-22 | Discharge: 2014-09-22 | Disposition: A | Discharge status: Home / Self Care | Payer: Medicare Other | Attending: Emergency Medicine | Admitting: Emergency Medicine

## 2014-09-22 ENCOUNTER — Encounter: Payer: Self-pay | Admitting: Licensed Clinical Social Worker

## 2014-09-22 ENCOUNTER — Encounter (HOSPITAL_COMMUNITY): Payer: Self-pay | Admitting: *Deleted

## 2014-09-22 DIAGNOSIS — F329 Major depressive disorder, single episode, unspecified: Secondary | ICD-10-CM | POA: Diagnosis not present

## 2014-09-22 DIAGNOSIS — N186 End stage renal disease: Secondary | ICD-10-CM | POA: Diagnosis not present

## 2014-09-22 DIAGNOSIS — G43909 Migraine, unspecified, not intractable, without status migrainosus: Secondary | ICD-10-CM | POA: Insufficient documentation

## 2014-09-22 DIAGNOSIS — J449 Chronic obstructive pulmonary disease, unspecified: Secondary | ICD-10-CM | POA: Insufficient documentation

## 2014-09-22 DIAGNOSIS — I120 Hypertensive chronic kidney disease with stage 5 chronic kidney disease or end stage renal disease: Secondary | ICD-10-CM | POA: Insufficient documentation

## 2014-09-22 DIAGNOSIS — Z9889 Other specified postprocedural states: Secondary | ICD-10-CM | POA: Diagnosis not present

## 2014-09-22 DIAGNOSIS — M1990 Unspecified osteoarthritis, unspecified site: Secondary | ICD-10-CM | POA: Insufficient documentation

## 2014-09-22 DIAGNOSIS — Z79899 Other long term (current) drug therapy: Secondary | ICD-10-CM | POA: Insufficient documentation

## 2014-09-22 DIAGNOSIS — Z8619 Personal history of other infectious and parasitic diseases: Secondary | ICD-10-CM | POA: Insufficient documentation

## 2014-09-22 DIAGNOSIS — Z944 Liver transplant status: Secondary | ICD-10-CM | POA: Diagnosis not present

## 2014-09-22 DIAGNOSIS — Z720 Tobacco use: Secondary | ICD-10-CM | POA: Insufficient documentation

## 2014-09-22 DIAGNOSIS — G8929 Other chronic pain: Secondary | ICD-10-CM | POA: Diagnosis not present

## 2014-09-22 DIAGNOSIS — Z951 Presence of aortocoronary bypass graft: Secondary | ICD-10-CM | POA: Diagnosis not present

## 2014-09-22 DIAGNOSIS — Z992 Dependence on renal dialysis: Secondary | ICD-10-CM | POA: Insufficient documentation

## 2014-09-22 DIAGNOSIS — I2510 Atherosclerotic heart disease of native coronary artery without angina pectoris: Secondary | ICD-10-CM | POA: Insufficient documentation

## 2014-09-22 DIAGNOSIS — E669 Obesity, unspecified: Secondary | ICD-10-CM | POA: Diagnosis not present

## 2014-09-22 DIAGNOSIS — E119 Type 2 diabetes mellitus without complications: Secondary | ICD-10-CM | POA: Diagnosis not present

## 2014-09-22 DIAGNOSIS — Z794 Long term (current) use of insulin: Secondary | ICD-10-CM | POA: Insufficient documentation

## 2014-09-22 DIAGNOSIS — K219 Gastro-esophageal reflux disease without esophagitis: Secondary | ICD-10-CM | POA: Diagnosis not present

## 2014-09-22 DIAGNOSIS — D649 Anemia, unspecified: Secondary | ICD-10-CM | POA: Diagnosis not present

## 2014-09-22 DIAGNOSIS — Z8701 Personal history of pneumonia (recurrent): Secondary | ICD-10-CM | POA: Insufficient documentation

## 2014-09-22 DIAGNOSIS — Z7901 Long term (current) use of anticoagulants: Secondary | ICD-10-CM | POA: Diagnosis not present

## 2014-09-22 DIAGNOSIS — Z7982 Long term (current) use of aspirin: Secondary | ICD-10-CM | POA: Diagnosis not present

## 2014-09-22 DIAGNOSIS — E039 Hypothyroidism, unspecified: Secondary | ICD-10-CM | POA: Diagnosis not present

## 2014-09-22 DIAGNOSIS — I471 Supraventricular tachycardia: Secondary | ICD-10-CM | POA: Insufficient documentation

## 2014-09-22 DIAGNOSIS — R079 Chest pain, unspecified: Secondary | ICD-10-CM | POA: Diagnosis not present

## 2014-09-22 DIAGNOSIS — K5900 Constipation, unspecified: Secondary | ICD-10-CM | POA: Diagnosis not present

## 2014-09-22 DIAGNOSIS — I499 Cardiac arrhythmia, unspecified: Secondary | ICD-10-CM | POA: Diagnosis present

## 2014-09-22 LAB — CBC
HEMATOCRIT: 36.9 % (ref 36.0–46.0)
Hemoglobin: 12.2 g/dL (ref 12.0–15.0)
MCH: 32 pg (ref 26.0–34.0)
MCHC: 33.1 g/dL (ref 30.0–36.0)
MCV: 96.9 fL (ref 78.0–100.0)
PLATELETS: 126 10*3/uL — AB (ref 150–400)
RBC: 3.81 MIL/uL — ABNORMAL LOW (ref 3.87–5.11)
RDW: 15.5 % (ref 11.5–15.5)
WBC: 4 10*3/uL (ref 4.0–10.5)

## 2014-09-22 LAB — I-STAT CHEM 8, ED
BUN: 7 mg/dL (ref 6–20)
CALCIUM ION: 1.06 mmol/L — AB (ref 1.13–1.30)
Chloride: 98 mmol/L — ABNORMAL LOW (ref 101–111)
Creatinine, Ser: 3 mg/dL — ABNORMAL HIGH (ref 0.44–1.00)
Glucose, Bld: 137 mg/dL — ABNORMAL HIGH (ref 65–99)
HEMATOCRIT: 40 % (ref 36.0–46.0)
HEMOGLOBIN: 13.6 g/dL (ref 12.0–15.0)
POTASSIUM: 3.6 mmol/L (ref 3.5–5.1)
Sodium: 137 mmol/L (ref 135–145)
TCO2: 30 mmol/L (ref 0–100)

## 2014-09-22 LAB — I-STAT TROPONIN, ED: TROPONIN I, POC: 0.04 ng/mL (ref 0.00–0.08)

## 2014-09-22 LAB — CBG MONITORING, ED: Glucose-Capillary: 124 mg/dL — ABNORMAL HIGH (ref 65–99)

## 2014-09-22 NOTE — ED Notes (Signed)
[name], [place] at bedside

## 2014-09-22 NOTE — ED Provider Notes (Signed)
CSN: [id number]     Arrival date & time 09/22/14  6440 History   First MD Initiated Contact with Patient 09/22/14 [phone]     Chief Complaint  Patient presents with  . Irregular Heart Beat     (Consider location/radiation/quality/duration/timing/severity/associated sxs/prior Treatment) HPI Comments: Patient with past medical history of CAD, diabetes, COPD, CKD on dialysis, status post liver transplant in 2011, presents to the emergency department with chief complaint of SVT. Patient has a history of SVT.  Patient states that the symptoms lasted approximately 20-30 minutes. She had 3 separate episodes, which all resolved with Valsalva. She denies any associated chest pain, shortness of breath, nausea, vomiting, or diarrhea. She states that she gets this  occasionally when she gets too much fluid pulled off from dialysis. Patient states that she had full dialysis yesterday, but then came back today and had another hour and 20 minutes. States that her doctor tells her to take her BP meds when she experiences the SVT, but she states she didn't have any with her.  She states that she is anticoagulated with coumadin. She states that she wanted to have the additional dialysis because she is going to a family reunion tomorrow.  The history is provided by the patient. No language interpreter was used.    Past Medical History  Diagnosis Date  . 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 Chronic back pain   . CAD (coronary artery disease)   . Obesity   . Peripheral vascular disease hands and legs  . Anxiety   . Asthma   . GERD (gastroesophageal reflux disease)     takes Omeprazole daily  . Chronic constipation     takes MIralax and Colace daily  . Anemia     takes Folic Acid daily  . Hypothyroidism     takes Synthroid daily  . Depression     takes Cymbalta for "severe" depression  . Neuromuscular disorder     carpal tunnel  in right hand  . Hypertension     takes Metoprolol and Lisinopril daily, sees Dr Bea Graff  . COPD (chronic obstructive pulmonary disease)   . Pneumonia     "today and several times before" (08/30/2012)  . Chronic bronchitis     "q yr w/season changes" (08/30/2012)  . Type II diabetes mellitus     Levemir 2units daily if > 150  . History of blood transfusion     "several" (08/30/2012)  . Hepatitis C   . Migraine     "last migraine was in 2013" (08/30/2012)  . Headache     "at least monthly" (08/30/2012)  . Arthritis     "left hand, back" (08/30/2012)  . End stage renal disease on dialysis 02/27/2011    Kidneys shut down at time of liver transplant in Sept 2011 at [facility] in [place], she has been on HD ever since.  Dialyzes at [facility] HD on TTS schedule.  Had L forearm graft used 10 months then removed Dec 2012 due to suspected infection.  A right upper arm AVG was placed Dec 2012 but she developed steal symptoms acutely and it was ligated the same day.  Never had an AV fistula due to small veins.  Now has L thigh AVG put in Jan 2013, has not clotted to date.    [name] CAD (coronary artery disease) Jan. 2015    Cath:  20% LAD, 50% D1; s/p LIMA-LAD  . Aortic stenosis   . Tobacco abuse   . S/P aortic valve replacement 03/15/13    Mechanical   . SVT (supraventricular tachycardia) 06/09/14   Past Surgical History  Procedure Laterality Date  . Liver transplant  10/25/2009    sees Dr Ferol Luz 1 every 6 months, saw last in Dec 2013. [name] Coord [phone]  . Small intestine surgery  90's  . Thrombectomy    . Arteriovenous graft placement Left 10/03/10     forearm  . Avgg removal  12/23/2010    Procedure: REMOVAL OF ARTERIOVENOUS GORETEX GRAFT ([place]);  Surgeon: Elam Dutch, MD;  Location: [place];  Service: Vascular;  Laterality: Left;  procedure started @1736 -4332  . Insertion of dialysis catheter  12/23/2010    Procedure: INSERTION OF DIALYSIS CATHETER;  Surgeon: Elam Dutch, MD;   Location: [place];  Service: Vascular;  Laterality: Right;  Right Internal Jugular 28cm dialysis catheter insertion procedure time 1701-1720   . Cholecystectomy  1993  . Cystoscopy  1990's  . Spinal growth rods  2010    "put 2 metal rods in my back; they had detetriorated" (08/30/2012)  . Av fistula placement  01/29/2011    Procedure: INSERTION OF ARTERIOVENOUS (AV) GORE-TEX GRAFT ARM;  Surgeon: Elam Dutch, MD;  Location: [place];  Service: Vascular;  Laterality: Right;  . Av fistula placement  03/10/2011    Procedure: INSERTION OF ARTERIOVENOUS (AV) GORE-TEX GRAFT THIGH;  Surgeon: Elam Dutch, MD;  Location: [place];  Service: Vascular;  Laterality: Left;  . Tubal ligation  1990's  . Aortic valve replacement N/A 03/15/2013    AVR; Surgeon: Ivin Poot, MD;  Location: [facility]; Open Heart Surgery;  36mm[employer]Medics mechanical prosthesis, top hat valve  . Coronary artery bypass graft N/A 03/15/2013    Procedure: CORONARY ARTERY BYPASS GRAFTING (CABG) times one using left internal mammary artery.;  Surgeon: Ivin Poot, MD;  Location: [place];  Service: Open Heart Surgery;  Laterality: N/A;  POSS CABG X 1  . Intraoperative transesophageal echocardiogram N/A 03/15/2013    Procedure: INTRAOPERATIVE TRANSESOPHAGEAL ECHOCARDIOGRAM;  Surgeon: Ivin Poot, MD;  Location: [place];  Service: Open Heart Surgery;  Laterality: N/A;  . Left heart catheterization with coronary/graft angiogram N/A 12/24/2011    Procedure: LEFT HEART CATHETERIZATION WITH Beatrix Fetters;  Surgeon: [name], MD;  Location: [facility] CATH LAB;  Service: Cardiovascular;  Laterality: N/A;  . Left heart catheterization with coronary angiogram N/A 07/29/2012    Procedure: LEFT HEART CATHETERIZATION WITH CORONARY ANGIOGRAM;  Surgeon: Troy Sine, MD;  Location: [facility] CATH LAB;  Service: Cardiovascular;  Laterality: N/A;  . Left heart catheterization with coronary angiogram N/A 03/10/2013    Procedure: LEFT HEART  CATHETERIZATION WITH CORONARY ANGIOGRAM;  Surgeon: Troy Sine, MD;  Location: [facility] CATH LAB;  Service: Cardiovascular;  Laterality: N/A;  . Left heart catheterization with coronary/graft angiogram N/A 12/16/2013    Procedure: LEFT HEART CATHETERIZATION WITH Beatrix Fetters;  Surgeon: Troy Sine, MD;  Location: [facility] CATH LAB;  Service: Cardiovascular;  Laterality: N/A;  . Thrombectomy and revision of arterioventous (av) goretex  graft Left 03/30/2014  . Thrombectomy and revision of arterioventous (av) goretex  graft Left 03/30/2014    Procedure: THROMBECTOMY AND REVISION OF ARTERIOVENTOUS (AV) GORETEX  GRAFT;  Surgeon: Conrad West Perrine, MD;  Location: [place];  Service: Vascular;  Laterality: Left;  . Shuntogram Left 05/15/2014    Procedure:  SHUNTOGRAM;  Surgeon: Conrad , MD;  Location: [facility] CATH LAB;  Service: Cardiovascular;  Laterality: Left;   Family History  Problem Relation Age of Onset  . Cancer Mother   . Diabetes Mother   . Hypertension Mother   . Stroke Mother   . Cancer Father   . Anesthesia problems Neg Hx   . Hypotension Neg Hx   . Malignant hyperthermia Neg Hx   . Pseudochol deficiency Neg Hx    Social History  Substance Use Topics  . Smoking status: Current Some Day Smoker -- 0.25 packs/day for 40 years    Types: Cigarettes  . Smokeless tobacco: Never Used     Comment: still smokng 1 or 2 a day  . Alcohol Use: No   OB History    No data available     Review of Systems  Constitutional: Negative for fever and chills.  Respiratory: Negative for shortness of breath.   Cardiovascular: Negative for chest pain.  Gastrointestinal: Negative for nausea, vomiting, diarrhea and constipation.  Genitourinary: Negative for dysuria.  All other systems reviewed and are negative.     Allergies  Acetaminophen; Codeine; Mirtazapine; and Morphine  Home Medications   Prior to Admission medications   Medication Sig Start Date End Date Taking? Authorizing Provider   albuterol (PROVENTIL,VENTOLIN) 90 MCG/ACT inhaler Inhale 1-2 puffs into the lungs every 6 (six) hours as needed for wheezing or shortness of breath.     Historical Provider, MD  aspirin EC 81 MG tablet Take 81 mg by mouth daily.     Historical Provider, MD  calcium acetate (PHOSLO) 667 MG capsule Take 667-2,001 mg by mouth See admin instructions. Take 3 capsules (2001 mg) with meals and 1 capsule (667 mg) with snacks    Historical Provider, MD  cinacalcet (SENSIPAR) 30 MG tablet Take 30 mg by mouth daily.    Historical Provider, MD  insulin aspart (NOVOLOG) 100 UNIT/ML injection Inject 1-2 Units into the skin 3 (three) times daily as needed for high blood sugar (CBG>150). CBG < 150 = no insulin CBG 150-200 = 1 unit CBG 200-250 = 2 units    Historical Provider, MD  ipratropium-albuterol (DUONEB) 0.5-2.5 (3) MG/3ML SOLN Take 3 mLs by nebulization every 6 (six) hours as needed. J44.1 code 08/31/14   Theodis Blaze, MD  levothyroxine (SYNTHROID, LEVOTHROID) 150 MCG tablet Take 150 mcg by mouth daily before breakfast.    Historical Provider, MD  metoprolol tartrate (LOPRESSOR) 25 MG tablet Take 25 mg by mouth 2 (two) times daily.  03/16/14   Historical Provider, MD  nitroGLYCERIN (NITROSTAT) 0.4 MG SL tablet Place 1 tablet (0.4 mg total) under the tongue every 5 (five) minutes as needed for chest pain. 03/20/14   Silver Huguenin Elgergawy, MD  ondansetron (ZOFRAN-ODT) 4 MG disintegrating tablet Take 4 mg by mouth every 8 (eight) hours as needed for nausea or vomiting.    Historical Provider, MD  predniSONE (DELTASONE) 20 MG tablet Take 50 mg tablet today and taper down by 10 mg daily until completed Patient not taking: Reported on 09/18/2014 08/30/14   Theodis Blaze, MD  tacrolimus (PROGRAF) 1 MG capsule Take 3 mg by mouth 2 (two) times daily.     Historical Provider, MD  traMADol (ULTRAM) 50 MG tablet Take 1 tablet (50 mg total) by mouth every 12 (twelve) hours as needed for moderate pain. 08/30/14   Theodis Blaze, MD   verapamil (CALAN) 120 MG tablet Take 1 tablet (120 mg total)  by mouth at bedtime. 06/19/14   Troy Sine, MD  warfarin (COUMADIN) 4 MG tablet Take 1 tablet by mouth daily or as directed by coumadin clinic 08/30/14   Troy Sine, MD   There were no vitals taken for this visit. Physical Exam  Constitutional: She is oriented to person, place, and time. She appears well-developed and well-nourished.  HENT:  Head: Normocephalic and atraumatic.  Eyes: Conjunctivae and EOM are normal. Pupils are equal, round, and reactive to light.  Neck: Normal range of motion. Neck supple.  Cardiovascular: Normal rate and regular rhythm.  Exam reveals no gallop and no friction rub.   No murmur heard. Pulmonary/Chest: Effort normal and breath sounds normal. No respiratory distress. She has no wheezes. She has no rales. She exhibits no tenderness.  Abdominal: Soft. Bowel sounds are normal. She exhibits no distension and no mass. There is no tenderness. There is no rebound and no guarding.  Musculoskeletal: Normal range of motion. She exhibits no edema or tenderness.  Neurological: She is alert and oriented to person, place, and time.  Skin: Skin is warm and dry.  Psychiatric: She has a normal mood and affect. Her behavior is normal. Judgment and thought content normal.  Nursing note and vitals reviewed.   ED Course  Procedures (including critical care time) Results for orders placed or performed during the hospital encounter of 09/22/14  CBC  Result Value Ref Range   WBC 4.0 4.0 - 10.5 K/uL   RBC 3.81 (L) 3.87 - 5.11 MIL/uL   Hemoglobin 12.2 12.0 - 15.0 g/dL   HCT 36.9 36.0 - 46.0 %   MCV 96.9 78.0 - 100.0 fL   MCH 32.0 26.0 - 34.0 pg   MCHC 33.1 30.0 - 36.0 g/dL   RDW 15.5 11.5 - 15.5 %   Platelets 126 (L) 150 - 400 K/uL  I-stat chem 8, ed  Result Value Ref Range   Sodium 137 135 - 145 mmol/L   Potassium 3.6 3.5 - 5.1 mmol/L   Chloride 98 (L) 101 - 111 mmol/L   BUN 7 6 - 20 mg/dL   Creatinine,  Ser 3.00 (H) 0.44 - 1.00 mg/dL   Glucose, Bld 137 (H) 65 - 99 mg/dL   Calcium, Ion 1.06 (L) 1.13 - 1.30 mmol/L   TCO2 30 0 - 100 mmol/L   Hemoglobin 13.6 12.0 - 15.0 g/dL   HCT 40.0 36.0 - 46.0 %  I-stat troponin, ED  Result Value Ref Range   Troponin i, poc 0.04 0.00 - 0.08 ng/mL   Comment 3          CBG monitoring, ED  Result Value Ref Range   Glucose-Capillary 124 (H) 65 - 99 mg/dL   Dg Chest 2 View  08/25/2014   CLINICAL DATA:  Shortness of breath and cough  EXAM: CHEST  2 VIEW  COMPARISON:  06/09/2014  FINDINGS: Previous median sternotomy and CABG procedure. Mild cardiac enlargement. There are bilateral pleural effusions and interstitial edema consistent with CHF. The visualized osseous structures are negative.  IMPRESSION: 1. Moderate congestive heart failure.   Electronically Signed   By: Kerby Moors M.D.   On: 08/25/2014 20:28     Imaging Review No results found. I, Kimberlyn Quiocho, personally reviewed and evaluated these lab results as part of my medical decision-making.   EKG Interpretation   Date/Time:  Friday September 22 2014 09:41:53 EDT Ventricular Rate:  72 PR Interval:  188 QRS Duration: 97 QT Interval:  461 QTC  Calculation: 505 R Axis:   -8 Text Interpretation:  Sinus rhythm Probable left atrial enlargement LVH  with secondary repolarization abnormality Anterior infarct, old Confirmed  by Hazle Coca [phone]) on 09/22/2014 9:44:55 AM      MDM   Final diagnoses:  SVT (supraventricular tachycardia)    Patient with 3 episodes of SVT, total SVT time less than 30 minutes. Symptoms resolved with Valsalva. Patient has had these symptoms before. Normally occurs when to much fluid is taken off during dialysis. Patient had a partial session of dialysis today and full dialysis yesterday because she is going out town. Suspect this is the reason for the SVT.  Labs and EKG are reassuring.  Patient is very well appearing.  She would like to be discharged if her labs look  ok.  Patient seen by and discussed with Dr. Ralene Bathe, who agrees with plan for discharge.  Will DC with return precautions.  Patient understands and agrees with the plan.  She is stable and ready for discharge.    Montine Circle, PA-C 09/22/14 1041  Quintella Reichert, MD 09/22/14 1144

## 2014-09-22 NOTE — Discharge Instructions (Signed)
Supraventricular Tachycardia °Supraventricular tachycardia (SVT) is an abnormal heart rhythm (arrhythmia) that causes the heart to beat very fast (tachycardia). This kind of fast heartbeat originates in the upper chambers of the heart (atria). SVT can cause the heart to beat greater than 100 beats per minute. SVT can have a rapid burst of heartbeats. This can start and stop suddenly without warning and is called nonsustained. SVT can also be sustained, in which the heart beats at a continuous fast rate.  °CAUSES  °There can be different causes of SVT. Some of these include: °· Heart valve problems such as mitral valve prolapse. °· An enlarged heart (hypertrophic cardiomyopathy). °· Congenital heart problems. °· Heart inflammation (pericarditis). °· Hyperthyroidism. °· Low potassium or magnesium levels. °· Caffeine. °· Drug use such as cocaine, methamphetamines, or stimulants. °· Some over-the-counter medicines such as: °¨ Decongestants. °¨ Diet medicines. °¨ Herbal medicines. °SYMPTOMS  °Symptoms of SVT can vary. Symptoms depend on whether the SVT is sustained or nonsustained. You may experience: °· No symptoms (asymptomatic). °· An awareness of your heart beating rapidly (palpitations). °· Shortness of breath. °· Chest pain or pressure. °If your blood pressure drops because of the SVT, you may experience: °· Fainting or near fainting. °· Weakness. °· Dizziness. °DIAGNOSIS  °Different tests can be performed to diagnose SVT, such as: °· An electrocardiogram (EKG). This is a painless test that records the electrical activity of your heart. °· Holter monitor. This is a 24 hour recording of your heart rhythm. You will be given a diary. Write down all symptoms that you have and what you were doing at the time you experienced symptoms. °· Arrhythmia monitor. This is a small device that your wear for several weeks. It records the heart rhythm when you have symptoms. °· Echocardiogram. This is an imaging test to help detect  abnormal heart structure such as congenital abnormalities, heart valve problems, or heart enlargement. °· Stress test. This test can help determine if the SVT is related to exercise. °· Electrophysiology study (EPS). This is a procedure that evaluates your heart's electrical system and can help your caregiver find the cause of your SVT. °TREATMENT  °Treatment of SVT depends on the symptoms, how often it recurs, and whether there are any underlying heart problems.  °· If symptoms are rare and no other cardiac disease is present, no treatment may be needed. °· Blood work may be done to check potassium, magnesium, and thyroid hormone levels to see if they are abnormal. If these levels are abnormal, treatment to correct the problems will occur. °Medicines °Your caregiver may use oral medicines to treat SVT. These medicines are given for long-term control of SVT. Medicines may be used alone or in combination with other treatments. These medicines work to slow nerve impulses in the heart muscle. These medicines can also be used to treat high blood pressure. Some of these medicines may include: °· Calcium channel blockers. °· Beta blockers. °· Digoxin. °Nonsurgical procedures °Nonsurgical techniques may be used if oral medicines do not work. Some examples include: °· Cardioversion. This technique uses either drugs or an electrical shock to restore a normal heart rhythm. °¨ Cardioversion drugs may be given through an intravenous (IV) line to help "reset" the heart rhythm. °¨ In electrical cardioversion, the caregiver shocks your heart to stop its beat for a split second. This helps to reset the heart to a normal rhythm. °· Ablation. This procedure is done under mild sedation. High frequency radio wave energy is used to   destroy the area of heart tissue responsible for the SVT. °HOME CARE INSTRUCTIONS  °· Do not smoke. °· Only take medicines prescribed by your caregiver. Check with your caregiver before using over-the-counter  medicines. °· Check with your caregiver about how much alcohol and caffeine (coffee, tea, colas, or chocolate) you may have. °· It is very important to keep all follow-up referrals and appointments in order to properly manage this problem. °SEEK IMMEDIATE MEDICAL CARE IF: °· You have dizziness. °· You faint or nearly faint. °· You have shortness of breath. °· You have chest pain or pressure. °· You have sudden nausea or vomiting. °· You have profuse sweating. °· You are concerned about how long your symptoms last. °· You are concerned about the frequency of your SVT episodes. °If you have the above symptoms, call your local emergency services (911 in [place] immediately. Do not drive yourself to the hospital. °MAKE SURE YOU:  °· Understand these instructions. °· Will watch your condition. °· Will get help right away if you are not doing well or get worse. °Document Released: 01/27/2005 Document Revised: 04/21/2011 Document Reviewed: 05/11/2008 °ExitCare® Patient Information ©2015 [employer]. This information is not intended to replace advice given to you by your health care provider. Make sure you discuss any questions you have with your health care provider. ° °

## 2014-09-22 NOTE — ED Notes (Signed)
Unable to gain IV access, IV team consult in place

## 2014-09-22 NOTE — ED Notes (Signed)
Pt in from [facility] via [facility], per report  Pt rcvd full HD & went back today with another tx with 1hr 21  mins of todays tx when pt experienced x 3 episodes of SVT in 10 mins that resolved with the valsalva maneuver, pt denies current CP, SOB, n/v/d upon arrival to ED, pt in NSR upon arrival to ED, A&O x 4, follows commands, speaks in complete sentences, CBG 112

## 2014-09-22 NOTE — Patient Outreach (Signed)
[facility]) Care Management  09/22/2014  [name] 1955/10/12 [id number]   Assessment-CSW continues to make attempts to contact [facility] to aid patient in gaining eyeglasses with little success. [facility] has returned call after work hours and CSW has not been able to get in touch with agency since then.  Plan-CSW will continue to outreach [facility] to make referral.  .Eula Fried, BSW, MSW, [email or web address] Phone: [phone] Fax: [phone]

## 2014-09-25 ENCOUNTER — Other Ambulatory Visit: Payer: Medicare Other | Admitting: Pharmacist

## 2014-09-25 ENCOUNTER — Ambulatory Visit: Payer: Medicare Other

## 2014-09-25 ENCOUNTER — Telehealth: Payer: Self-pay | Admitting: *Deleted

## 2014-09-25 ENCOUNTER — Other Ambulatory Visit: Payer: Self-pay

## 2014-09-25 NOTE — Patient Outreach (Signed)
Transition of care call: Placed call to patient. No answer. Left a message requesting a call back.  Kleigh Hoelzer, RN, BSN, CEN [facility] Community Care Coordinator [phone] 

## 2014-09-25 NOTE — Telephone Encounter (Signed)
Will continue to monitor.

## 2014-09-25 NOTE — Patient Outreach (Signed)
[facility] [facility]) Care Management  09/25/2014  [name] 1955/11/07 [id number]   I went to [name]' house for an initial pharmacy home visit to discuss smoking cessation.  I knocked multiple times on the door and called the phone number on file.  When I called the patient's number, her phone rang under the porch.  I found her phone beside her front porch steps.  A neighbor walked up who checked inside to make sure the patient was not home, and put her phone inside her house.  The neighbor also called [name]' daughter to make sure everything was ok.  [name]' daughter reported that her mother was at dialysis today instead of her normal Tuesday, Thursday, Saturday schedule because she went out of town over the weekend and her dialysis schedule had to be adjusted.  Patient will resume normal dialysis schedule on Thursday.  I left my business card on the front porch of [name]' house with my call back number.  I will call the patient tomorrow to try to reschedule our smoking cessation visit.    Elisabeth Most, Pharm.D. Pharmacy Resident [place]

## 2014-09-26 ENCOUNTER — Other Ambulatory Visit: Payer: Self-pay | Admitting: Pharmacist

## 2014-09-26 ENCOUNTER — Inpatient Hospital Stay: Payer: Medicare Other | Admitting: Pulmonary Disease

## 2014-09-26 NOTE — Patient Outreach (Signed)
[place] [facility]) Care Management  09/26/2014  SHAWNEEN DEETZ 02/12/1955 [id number]    I called Ms. Ravenscroft to set up an appointment to make a home visit visit for smoking cessation.  There was no answer and the phone went to voicemail.  I left a HIPPA compliant message for her to call me back.  This is my second attempt to reach her.  One attempt has been a home visit which she was not home.  I will follow up next week to try to reschedule.     Elisabeth Most, Pharm.D. Pharmacy Resident [place]

## 2014-09-27 ENCOUNTER — Telehealth: Payer: Self-pay | Admitting: *Deleted

## 2014-09-27 ENCOUNTER — Other Ambulatory Visit: Payer: Self-pay | Admitting: Licensed Clinical Social Worker

## 2014-09-27 ENCOUNTER — Other Ambulatory Visit: Payer: Self-pay | Admitting: Pharmacist

## 2014-09-27 DIAGNOSIS — N186 End stage renal disease: Secondary | ICD-10-CM | POA: Diagnosis not present

## 2014-09-27 DIAGNOSIS — I2510 Atherosclerotic heart disease of native coronary artery without angina pectoris: Secondary | ICD-10-CM | POA: Diagnosis not present

## 2014-09-27 DIAGNOSIS — I5023 Acute on chronic systolic (congestive) heart failure: Secondary | ICD-10-CM | POA: Diagnosis not present

## 2014-09-27 DIAGNOSIS — E1122 Type 2 diabetes mellitus with diabetic chronic kidney disease: Secondary | ICD-10-CM | POA: Diagnosis not present

## 2014-09-27 DIAGNOSIS — I120 Hypertensive chronic kidney disease with stage 5 chronic kidney disease or end stage renal disease: Secondary | ICD-10-CM | POA: Diagnosis not present

## 2014-09-27 DIAGNOSIS — J441 Chronic obstructive pulmonary disease with (acute) exacerbation: Secondary | ICD-10-CM | POA: Diagnosis not present

## 2014-09-27 NOTE — Telephone Encounter (Signed)
Writer had received message to return call to this pt. I had LM with date and time of f/u appt with [place] [facility] and instructions to reschedule if unable to attend. Recalled [phone] and LM for [name] to call  @ [phone] and reschedule her f/u appt

## 2014-09-27 NOTE — Patient Outreach (Signed)
[facility]) Care Management  09/27/2014  GENEAN ADAMSKI [birth year] [id number]   Ms. Nowland called me back to reschedule our visit for smoking cessation.  I rescheduled our initial home visit for Friday, August 19th at 9:30 [place], Pharm.D. Pharmacy Resident [place]

## 2014-09-27 NOTE — Patient Outreach (Signed)
[place]) Care Management  09/27/2014  JACY BROCKER 03-01-1955 [id number]   Assessment-CSW successfully completed outreach to patient. HIPPA verifications were received. Patient stated that she is very thankful for CSW's help. Patient reports that she is now using [facility] and has also been successfully approved by [employer] in [place] to receive an eye exam ($20.00 dollar co pay which patient is agreeable to) and eye glasses free of charge. Patient reported that she is in a very good mood today. CSW informed patient that her old psychiatrist is still out of town but encouraged patient to consider going to [place].  Plan-CSW will contact psychiatrist office once psychiatrist is back from vacation. CSW will remain available to patient for any further needs.  Eula Fried, BSW, MSW, [email or web address] Phone: [phone] Fax: [phone]

## 2014-09-29 ENCOUNTER — Other Ambulatory Visit: Payer: Self-pay

## 2014-09-29 ENCOUNTER — Other Ambulatory Visit: Payer: Self-pay | Admitting: Pharmacist

## 2014-09-29 NOTE — Patient Outreach (Signed)
Final transition of care call/case closure: Spoke with patient who reports that she is doing well. Reports that she has another episodes of SVT at dialysis yesterday when her blood pressure dropped. . Renal doctor saw her during dialysis and changed her dry weight and gave her some saline. Patient reports that after the saline she was fine. Patient denies shortness of breath or swelling. Patient reports that she has a follow up appointment with Dr. Holt next week. Patient feels very comfortable with her resources at the dialysis center.  Patient denies any new problems or concerns.  THN CM Care Plan Problem One        Patient Outreach Telephone from 09/29/2014 in [facility]   Care Plan Problem One  Knowledge deficit related to COPD, Heart failure related to reducing admissions as evidence by 4 admissions in the last 6 months.   Care Plan for Problem One  Active   THN CM Short Term Goal #1 (0-30 days)  Patient will be able to report understanding of daily management and recognition of symptoms in the next 2 week.   THN CM Short Term Goal #1 Start Date  09/18/14   THN CM Short Term Goal #1 Met Date  09/29/14 [goal met. patient now with early recognition of problems]   Interventions for Short Term Goal #1  Home visit completed. Reviewed symptoms of COPD and Heart failure. Reviewed COPD zones, Heart failure zones. Provided THN calandar with zones attached for easy reference of patient.  Printed EMMI provided and reviewed during home visit.    THN CM Care Plan Problem Two        Patient Outreach Telephone from 09/29/2014 in [facility]   Care Plan Problem Two  Patient with recent admission related to heart failure.   Care Plan for Problem Two  Active   Interventions for Problem Two Long Term Goal   Reinforced importance of early recognition of symptoms of illness. Home visit completed today.   THN Long Term Goal (31-90) days  Patient will be able to verbalize no readmissions in  the next 31 days.   THN Long Term Goal Start Date  08/31/14   THN Long Term Goal Met Date  09/29/14 [goal met. no readmissions]   THN CM Short Term Goal #1 (0-30 days)  Patient will be able to report follow up with primary MD in 10 days,   THN CM Short Term Goal #1 Start Date  08/31/14   THN CM Short Term Goal #1 Met Date   09/15/14 [Goal Met. Saw PCP on 8/5]   Interventions for Short Term Goal #2   Reviewed importance of hospital follow up with patient. Patient has agreed to call and get MD appointment.   THN CM Short Term Goal #2 (0-30 days)  Patient will be able to report that she has her nebulizer equipment and home health RN in 7 days   THN CM Short Term Goal #2 Start Date  08/31/14   THN CM Short Term Goal #2 Met Date  09/07/14 [goal met: confirms that she has her equipment]   Interventions for Short Term Goal #2  Called [facility] ( hospital [facility]) to secure orders for RN and neb machine. Patient was informed.    THN CM Care Plan Problem Three        Patient Outreach Telephone from 09/11/2014 in [facility]   THN CM Short Term Goal #1 Met Date  09/11/14   Interventions for Short Term   Goal #1  Goal has been met. CSW made referral to [facility] and provided patient with contact information to set up appointments.     Plan: Patient has successfully completed the transition of care program. Patient remains active with [facility] pharmacy for smoking cessation.  Will close patient to nursing at this time and send an update to MD. Encouraged patient to call in the future if she has other needs that she needs assistance with.   Phone call to [facility] to update of case closure for nursing.  [name], RN, BSN, CEN [facility] [employer] [phone]

## 2014-09-30 NOTE — Patient Outreach (Signed)
[place] [facility]) Care Management  [place]   09/30/2014  [name] 1955/12/13 [id number]  Subjective: [name] is a 59 year old female who was referred to [place] from [place], [name] for assistance with smoking cessation.  I made initial home visit along with [employer], Pharm.D.    [name] reports she is currently smoking 10 cigarettes on a good day and a pack per day on a bad day.  She currently smokes any type of cheap filtered cigarette.  She has had one successful quit attempt in the past when she had her liver transplant.  She quit smoking for 2 years, but she started smoking again when another patient offered her a cigarette at dialysis 2 years ago.  She said she tried quitting again on July 20th, 2016, but she did not have a plan in place for how to deal with cravings.  She said she stopped smoking for one day then slowly started smoking a couple of cigarettes a day following her quit date.  When asked on a scale from 1 to 10 how ready she is to quit, she reports she is a 10.  She reports several motivating factors to quit: she wants her breathing to improve, she wants to get on the kidney transplant list, she wants to try to improve her relationship with her daughter who is trying to get her to stop smoking, she wants to be able to spend more time with her adopted grandkids, and she does not want her house or clothes to smell like smoke all the time.  I assigned an EMMI video about smoking cessation and we watched the video during my visit.  We also talked about how smoking cessation improves health by reducing lung cancer, heart disease, etc.  [name] reports her triggers for smoking are after she eats, before she lays down, and when she gets frustrated by bills or her daughter.  She is not interested in nicotine replacement products at this time.  She said she has tried nicotine patches and gum in the past and does not believe they worked for  her.  We discussed the importance of replacing cigarettes with a positive activity as a distraction technique.    Objective:   Current Medications: Current Outpatient Prescriptions  Medication Sig Dispense Refill  . albuterol (PROVENTIL,VENTOLIN) 90 MCG/ACT inhaler Inhale 1-2 puffs into the lungs every 6 (six) hours as needed for wheezing or shortness of breath.     [name] aspirin EC 81 MG tablet Take 81 mg by mouth daily.     . calcium acetate (PHOSLO) 667 MG capsule Take 667-1,334 mg by mouth 3 (three) times daily with meals. Pt reports taking 1 tab with breakfast and lunch and 2 tabs with dinner.  (but says she rarely eats breakfast or lunch)    . cinacalcet (SENSIPAR) 30 MG tablet Take 30 mg by mouth daily.    . diphenhydramine-acetaminophen (TYLENOL PM) 25-500 MG TABS Take 1 tablet by mouth at bedtime as needed (takes approximately once a week).    . insulin aspart (NOVOLOG) 100 UNIT/ML injection Inject 1-2 Units into the skin 3 (three) times daily as needed for high blood sugar (CBG>150). CBG < 150 = no insulin CBG 150-200 = 1 unit CBG 200-250 = 2 units    . ipratropium-albuterol (DUONEB) 0.5-2.5 (3) MG/3ML SOLN Take 3 mLs by nebulization every 6 (six) hours as needed. J44.1 code (Patient taking differently: Take 3 mLs by  nebulization every 6 (six) hours as needed (shortness of breath, wheezing). J44.1 code) 360 mL 1  . levothyroxine (SYNTHROID, LEVOTHROID) 150 MCG tablet Take 150 mcg by mouth daily before breakfast.    . metoprolol tartrate (LOPRESSOR) 25 MG tablet Take 25 mg by mouth 2 (two) times daily.     . Multiple Vitamins-Minerals (MULTIVITAMIN & MINERAL PO) Take 1 tablet by mouth daily.    . nitroGLYCERIN (NITROSTAT) 0.4 MG SL tablet Place 1 tablet (0.4 mg total) under the tongue every 5 (five) minutes as needed for chest pain. 30 tablet 0  . ondansetron (ZOFRAN-ODT) 4 MG disintegrating tablet Take 4 mg by mouth every 8 (eight) hours as needed for nausea or vomiting.    . polyethylene  glycol (MIRALAX / GLYCOLAX) packet Take 17 g by mouth daily as needed for mild constipation or moderate constipation.    . tacrolimus (PROGRAF) 1 MG capsule Take 3 mg by mouth 2 (two) times daily.     . traMADol (ULTRAM) 50 MG tablet Take 1 tablet (50 mg total) by mouth every 12 (twelve) hours as needed for moderate pain. 60 tablet 0  . verapamil (CALAN) 120 MG tablet Take 1 tablet (120 mg total) by mouth at bedtime. 30 tablet 6  . warfarin (COUMADIN) 4 MG tablet Take 1 tablet by mouth daily or as directed by coumadin clinic (Patient taking differently: Take 2-4 mg by mouth daily at 6 PM. Take 2 mg on dialysis days (tue, thur, and sat). Take 4 mg on all other days (sun, mon, wed, and fri)) 30 tablet 3  . predniSONE (DELTASONE) 20 MG tablet Take 50 mg tablet today and taper down by 10 mg daily until completed (Patient not taking: Reported on 09/18/2014) 15 tablet 0   No current facility-administered medications for this visit.    Functional Status: In your present state of health, do you have any difficulty performing the following activities: 09/18/2014 08/28/2014  Hearing? N N  Vision? Y N  Difficulty concentrating or making decisions? N N  Walking or climbing stairs? Y Y  Dressing or bathing? - N  Doing errands, shopping? Tempie Donning  Preparing Food and eating ? N -  Using the Toilet? N -  In the past six months, have you accidently leaked urine? N -  Do you have problems with loss of bowel control? N -  Managing your Medications? N -  Managing your Finances? N -  Housekeeping or managing your Housekeeping? Y -    Fall/Depression Screening: PHQ 2/9 Scores 09/18/2014  PHQ - 2 Score 3  PHQ- 9 Score 10    Assessment: 1.  Smoking cessation:  [name] appears to be motivated to quit smoking.  When asked on a scale of 1 to 10 how ready she was to quit she reports she is a 10.  [name] is not ready to set another quit date yet.  Will set goal to slowly reduce the number of cigarettes before  setting a quit date.    2.  During review of medication list, patient reports she occasionally takes Tylenol PM (approximately once a week) to help with sleep.  Tylenol is listed on [name]' allergy list as contraindicated due to her liver transplant.    Plan: 1.  In the next week, [name] will try to smoke 10 cigarettes or less per day.   2.  [name] will use distraction techniques such as deep breathing, walking, call someone that loves to talk, eat  a mint (sugar free cinnamon or peppermint) 3.  [name] will tell her daughter about her plan to reduce number of cigarettes and will use daughter as an accountability person/support.  4.   Counseled patient not to take Tylenol PM to aid in sleep.  Discussed active ingredients of Tylenol PM and discussed other OTC options that contain diphenhydramine without acetaminophen.   5.  Patient will discuss options for sleep with PCP at her next visit.   6.  I will follow up with patient on 10/06/14.    [facility] CM Care Plan Problem One        Patient Outreach from 09/29/2014 in [place] Problem One  Patient is a current smoker and is intersted in quitting   Care Plan for Problem One  Active   THN CM Short Term Goal #1 (0-30 days)  Patient will smoke 10 cigaretters or less in the next 7 days per patient report.    THN CM Short Term Goal #1 Start Date  09/29/14   Interventions for Short Term Goal #1  I counseled [name] on importance of smoking cessation and starting slow. We talked about distraction techniques to try when she is craving cigarettes (ie deep breathing, listen to hypnosis CD, take a walk)    [place] Problem Two        Patient Outreach Telephone from 09/29/2014 in [place] Problem Two  Patient with recent admission related to heart failure.   Care Plan for Problem Two  Active   Interventions for Problem Two Long Term Goal   Reinforced importance of early recognition of  symptoms of illness. Home visit completed today.   THN Long Term Goal (31-90) days  Patient will be able to verbalize no readmissions in the next 31 days.   THN Long Term Goal Start Date  08/31/14   [facility] Long Term Goal Met Date  09/29/14 [facility] met. no readmissions]   THN CM Short Term Goal #1 (0-30 days)  Patient will be able to report follow up with primary MD in 10 days,   THN CM Short Term Goal #1 Start Date  08/31/14   [facility] CM Short Term Goal #1 Met Date   09/15/14 [Goal Met. Saw PCP on 8/5]   Interventions for Short Term Goal #2   Reviewed importance of hospital follow up with patient. Patient has agreed to call and get MD appointment.   THN CM Short Term Goal #2 (0-30 days)  Patient will be able to report that she has her nebulizer equipment and home health RN in 7 days   THN CM Short Term Goal #2 Start Date  08/31/14   [facility] CM Short Term Goal #2 Met Date  09/07/14 Barrie Folk met: confirms that she has her equipment]   Interventions for Short Term Goal #2  Called [name] ( hospital [facility]) to secure orders for RN and neb machine. Patient was informed.    [facility] CM Care Plan Problem Three        Patient Outreach Telephone from 09/11/2014 in [place] CM Short Term Goal #1 Met Date  09/11/14   Interventions for Short Term Goal #1  Goal has been met. CSW made referral to [facility] and provided patient with contact information to set up appointments.       Elisabeth Most, Pharm.D. Pharmacy Resident [place]

## 2014-10-02 ENCOUNTER — Other Ambulatory Visit: Payer: Self-pay | Admitting: *Deleted

## 2014-10-02 DIAGNOSIS — Z6828 Body mass index (BMI) 28.0-28.9, adult: Secondary | ICD-10-CM | POA: Diagnosis not present

## 2014-10-02 DIAGNOSIS — B182 Chronic viral hepatitis C: Secondary | ICD-10-CM | POA: Diagnosis not present

## 2014-10-02 DIAGNOSIS — Z944 Liver transplant status: Secondary | ICD-10-CM | POA: Diagnosis not present

## 2014-10-02 MED ORDER — VERAPAMIL HCL 120 MG PO TABS: 120.0000 mg | ORAL_TABLET | Freq: Every day | ORAL | Status: DC

## 2014-10-03 ENCOUNTER — Other Ambulatory Visit: Payer: Self-pay | Admitting: *Deleted

## 2014-10-03 DIAGNOSIS — Z944 Liver transplant status: Secondary | ICD-10-CM | POA: Diagnosis not present

## 2014-10-03 DIAGNOSIS — Z79899 Other long term (current) drug therapy: Secondary | ICD-10-CM | POA: Diagnosis not present

## 2014-10-03 DIAGNOSIS — Z5181 Encounter for therapeutic drug level monitoring: Secondary | ICD-10-CM | POA: Diagnosis not present

## 2014-10-03 DIAGNOSIS — K769 Liver disease, unspecified: Secondary | ICD-10-CM | POA: Diagnosis not present

## 2014-10-03 DIAGNOSIS — I359 Nonrheumatic aortic valve disorder, unspecified: Secondary | ICD-10-CM | POA: Diagnosis not present

## 2014-10-03 DIAGNOSIS — B182 Chronic viral hepatitis C: Secondary | ICD-10-CM | POA: Diagnosis not present

## 2014-10-03 LAB — PROTIME-INR: INR: 4.6 — AB (ref 0.9–1.1)

## 2014-10-04 ENCOUNTER — Other Ambulatory Visit: Payer: Self-pay | Admitting: Pharmacist Clinician (PhC)/ Clinical Pharmacy Specialist

## 2014-10-04 MED ORDER — WARFARIN SODIUM 4 MG PO TABS: ORAL_TABLET | ORAL | Status: DC

## 2014-10-05 ENCOUNTER — Ambulatory Visit (INDEPENDENT_AMBULATORY_CARE_PROVIDER_SITE_OTHER): Payer: Medicare Other | Admitting: Pharmacist Clinician (PhC)/ Clinical Pharmacy Specialist

## 2014-10-05 DIAGNOSIS — Z954 Presence of other heart-valve replacement: Secondary | ICD-10-CM

## 2014-10-05 DIAGNOSIS — I359 Nonrheumatic aortic valve disorder, unspecified: Secondary | ICD-10-CM | POA: Diagnosis not present

## 2014-10-05 DIAGNOSIS — Z952 Presence of prosthetic heart valve: Secondary | ICD-10-CM

## 2014-10-05 DIAGNOSIS — Z7901 Long term (current) use of anticoagulants: Secondary | ICD-10-CM

## 2014-10-06 ENCOUNTER — Ambulatory Visit (HOSPITAL_BASED_OUTPATIENT_CLINIC_OR_DEPARTMENT_OTHER): Payer: Medicare Other | Attending: Cardiovascular Disease

## 2014-10-06 ENCOUNTER — Other Ambulatory Visit: Payer: Self-pay | Admitting: Pharmacist

## 2014-10-06 DIAGNOSIS — I5023 Acute on chronic systolic (congestive) heart failure: Secondary | ICD-10-CM | POA: Diagnosis not present

## 2014-10-06 DIAGNOSIS — I2510 Atherosclerotic heart disease of native coronary artery without angina pectoris: Secondary | ICD-10-CM | POA: Diagnosis not present

## 2014-10-06 DIAGNOSIS — E1122 Type 2 diabetes mellitus with diabetic chronic kidney disease: Secondary | ICD-10-CM | POA: Diagnosis not present

## 2014-10-06 DIAGNOSIS — I120 Hypertensive chronic kidney disease with stage 5 chronic kidney disease or end stage renal disease: Secondary | ICD-10-CM | POA: Diagnosis not present

## 2014-10-06 DIAGNOSIS — J441 Chronic obstructive pulmonary disease with (acute) exacerbation: Secondary | ICD-10-CM | POA: Diagnosis not present

## 2014-10-06 DIAGNOSIS — N186 End stage renal disease: Secondary | ICD-10-CM | POA: Diagnosis not present

## 2014-10-06 NOTE — Patient Outreach (Signed)
[place] [facility]) Care Management  [place]   10/06/2014  [name] Jul 05, 1955 [id number]  Subjective: [name] is a 59 year old female who was referred to [place] from [place], [name] for assistance with smoking cessation.  I called today to follow up with her regarding smoking cessation and our goal for her to smoke 10 cigarettes or less over the past week.    [name] reports she is doing good.  She said over the past week she smoked 10 cigarettes or less every day except one.  She said one day she got frustrated and smoked 12 cigarettes.  She is currently having her daughter ration her cigarettes and only give her 10 per day.  She said on the day she got frustrated she found where her daughter was keeping the cigarettes and took two more.  She said she used distraction technique and anytime she had a craving to smoke more than 10 cigarettes she would get up and do something to keep her busy or she would focus on deep breathing while watching TV.  She said these methods helped.  She said she is still thinking about a quit date, but she is still not ready to set one at this time because she does not want to fail again.    I asked [name] if she talked to her doctor about alternative medications for sleep.  She said she forgot to ask.  She said she has not taken any Tylenol PM since my visit last week.    Objective:   Current Medications: Current Outpatient Prescriptions  Medication Sig Dispense Refill  . albuterol (PROVENTIL,VENTOLIN) 90 MCG/ACT inhaler Inhale 1-2 puffs into the lungs every 6 (six) hours as needed for wheezing or shortness of breath.     [name] aspirin EC 81 MG tablet Take 81 mg by mouth daily.     . calcium acetate (PHOSLO) 667 MG capsule Take 667-1,334 mg by mouth 3 (three) times daily with meals. Pt reports taking 1 tab with breakfast and lunch and 2 tabs with dinner.  (but says she rarely eats breakfast or lunch)    . cinacalcet  (SENSIPAR) 30 MG tablet Take 30 mg by mouth daily.    . diphenhydramine-acetaminophen (TYLENOL PM) 25-500 MG TABS Take 1 tablet by mouth at bedtime as needed (takes approximately once a week).    . insulin aspart (NOVOLOG) 100 UNIT/ML injection Inject 1-2 Units into the skin 3 (three) times daily as needed for high blood sugar (CBG>150). CBG < 150 = no insulin CBG 150-200 = 1 unit CBG 200-250 = 2 units    . ipratropium-albuterol (DUONEB) 0.5-2.5 (3) MG/3ML SOLN Take 3 mLs by nebulization every 6 (six) hours as needed. J44.1 code (Patient taking differently: Take 3 mLs by nebulization every 6 (six) hours as needed (shortness of breath, wheezing). J44.1 code) 360 mL 1  . levothyroxine (SYNTHROID, LEVOTHROID) 150 MCG tablet Take 150 mcg by mouth daily before breakfast.    . metoprolol tartrate (LOPRESSOR) 25 MG tablet Take 25 mg by mouth 2 (two) times daily.     . Multiple Vitamins-Minerals (MULTIVITAMIN & MINERAL PO) Take 1 tablet by mouth daily.    . nitroGLYCERIN (NITROSTAT) 0.4 MG SL tablet Place 1 tablet (0.4 mg total) under the tongue every 5 (five) minutes as needed for chest pain. 30 tablet 0  . ondansetron (ZOFRAN-ODT) 4 MG disintegrating tablet Take 4 mg by mouth every 8 (eight) hours as  needed for nausea or vomiting.    . polyethylene glycol (MIRALAX / GLYCOLAX) packet Take 17 g by mouth daily as needed for mild constipation or moderate constipation.    . predniSONE (DELTASONE) 20 MG tablet Take 50 mg tablet today and taper down by 10 mg daily until completed (Patient not taking: Reported on 09/18/2014) 15 tablet 0  . tacrolimus (PROGRAF) 1 MG capsule Take 3 mg by mouth 2 (two) times daily.     . traMADol (ULTRAM) 50 MG tablet Take 1 tablet (50 mg total) by mouth every 12 (twelve) hours as needed for moderate pain. 60 tablet 0  . verapamil (CALAN) 120 MG tablet Take 1 tablet (120 mg total) by mouth at bedtime. NEED OV. 30 tablet 1  . warfarin (COUMADIN) 4 MG tablet Take 1 tablet by mouth daily  or as directed by coumadin clinic 90 tablet 0   No current facility-administered medications for this visit.    Functional Status: In your present state of health, do you have any difficulty performing the following activities: 09/18/2014 08/28/2014  Hearing? N N  Vision? Y N  Difficulty concentrating or making decisions? N N  Walking or climbing stairs? Y Y  Dressing or bathing? - N  Doing errands, shopping? Tempie Donning  Preparing Food and eating ? N -  Using the Toilet? N -  In the past six months, have you accidently leaked urine? N -  Do you have problems with loss of bowel control? N -  Managing your Medications? N -  Managing your Finances? N -  Housekeeping or managing your Housekeeping? Y -    Fall/Depression Screening: PHQ 2/9 Scores 09/18/2014  PHQ - 2 Score 3  PHQ- 9 Score 10   Assessment: 1.  Smoking cessation:  [name] continues to be motivated to quit smoking.  She met her goal of smoking 10 cigarettes or less over the past week all days except for one day.  [name] is not ready to set another quit date yet.  Will continue to work toward slowly reducing the number of cigarettes at this time.      2. Tylenol PM: Patient has not taken any since my visit on 09/29/14.   Plan: 1.  In the next week, [name] will try to smoke less than 10 cigarettes per day.    2.  [name] will continue to use distraction techniques such as deep breathing, walking, call someone that loves to talk, eat a mint (sugar free cinnamon or peppermint) to distract her from her cigarette cravings.  3. Over the next week she will think about setting a quit date.   4.  Counseled patient not to take Tylenol PM to aid in sleep.  She will discuss options for sleep with PCP at her next visit.    5.   I will follow up with patient on 10/13/14.   THN CM Care Plan Problem One        Most Recent Value   Care Plan Problem One  Patient is a current smoker and is intersted in quitting   Role Documenting the  Problem One  [place] for Problem One  Active   THN CM Short Term Goal #1 (0-30 days)  Patient will smoke 10 cigaretters or less in the next 7 days per patient report.    THN CM Short Term Goal #1 Start Date  09/29/14   THN CM Short Term Goal #1 Met Date  10/06/14 [patient met goal on all days except one when she smoked 12]   Interventions for Short Term Goal #1  I counseled Ms. Duesing on importance of smoking cessation and starting slow. We talked about distraction techniques to try when she is craving cigarettes (ie deep breathing, listen to hypnosis CD, take a walk)   THN CM Short Term Goal #2 (0-30 days)  Patient will smoke less than 10 cigarettes in the next 7 days per patient report.    THN CM Short Term Goal #2 Start Date  10/06/14   Interventions for Short Term Goal #2  We re-reviewed distraction techniques should she crave more than 10 cigarettes per day.      Elisabeth Most, Pharm.D. Pharmacy Resident [place]

## 2014-10-10 DIAGNOSIS — I359 Nonrheumatic aortic valve disorder, unspecified: Secondary | ICD-10-CM | POA: Diagnosis not present

## 2014-10-10 LAB — PROTIME-INR: INR: 2.3 — AB (ref 0.9–1.1)

## 2014-10-11 DIAGNOSIS — E1122 Type 2 diabetes mellitus with diabetic chronic kidney disease: Secondary | ICD-10-CM | POA: Diagnosis not present

## 2014-10-11 DIAGNOSIS — N186 End stage renal disease: Secondary | ICD-10-CM | POA: Diagnosis not present

## 2014-10-11 DIAGNOSIS — I5023 Acute on chronic systolic (congestive) heart failure: Secondary | ICD-10-CM | POA: Diagnosis not present

## 2014-10-11 DIAGNOSIS — Z992 Dependence on renal dialysis: Secondary | ICD-10-CM | POA: Diagnosis not present

## 2014-10-11 DIAGNOSIS — I2510 Atherosclerotic heart disease of native coronary artery without angina pectoris: Secondary | ICD-10-CM | POA: Diagnosis not present

## 2014-10-11 DIAGNOSIS — T8640 Unspecified complication of liver transplant: Secondary | ICD-10-CM | POA: Diagnosis not present

## 2014-10-11 DIAGNOSIS — J441 Chronic obstructive pulmonary disease with (acute) exacerbation: Secondary | ICD-10-CM | POA: Diagnosis not present

## 2014-10-11 DIAGNOSIS — I120 Hypertensive chronic kidney disease with stage 5 chronic kidney disease or end stage renal disease: Secondary | ICD-10-CM | POA: Diagnosis not present

## 2014-10-12 ENCOUNTER — Ambulatory Visit (INDEPENDENT_AMBULATORY_CARE_PROVIDER_SITE_OTHER): Payer: Medicare Other | Admitting: Pharmacist Clinician (PhC)/ Clinical Pharmacy Specialist

## 2014-10-12 DIAGNOSIS — N2581 Secondary hyperparathyroidism of renal origin: Secondary | ICD-10-CM | POA: Diagnosis not present

## 2014-10-12 DIAGNOSIS — Z952 Presence of prosthetic heart valve: Secondary | ICD-10-CM

## 2014-10-12 DIAGNOSIS — E118 Type 2 diabetes mellitus with unspecified complications: Secondary | ICD-10-CM | POA: Diagnosis not present

## 2014-10-12 DIAGNOSIS — Z954 Presence of other heart-valve replacement: Secondary | ICD-10-CM

## 2014-10-12 DIAGNOSIS — D631 Anemia in chronic kidney disease: Secondary | ICD-10-CM | POA: Diagnosis not present

## 2014-10-12 DIAGNOSIS — Z7901 Long term (current) use of anticoagulants: Secondary | ICD-10-CM

## 2014-10-12 DIAGNOSIS — N186 End stage renal disease: Secondary | ICD-10-CM | POA: Diagnosis not present

## 2014-10-13 ENCOUNTER — Other Ambulatory Visit: Payer: Self-pay | Admitting: Pharmacist

## 2014-10-13 NOTE — Patient Outreach (Signed)
[name]) Care Management  10/13/2014  [name] [birth year] [id number]  [name] is a 59 year old female who was referred to [place] from [place], [name] for assistance with smoking cessation.  I called today to follow up with her regarding smoking cessation and our goal for her to smoke less than 10 cigarettes over the past week.  Ms. Purpura answered the phone and reports she is doing ok.  I could hear other voices in the background and was having difficulty hearing patient.  [name] requested that I call her back this afternoon.    Elisabeth Most, Pharm.D. Pharmacy Resident [place]

## 2014-10-13 NOTE — Patient Outreach (Signed)
[place] [facility]) Care Management  [place]   10/13/2014  [name] 10-Jan-1956 [id number]  Subjective: [name] is a 59 year old female who was referred to [place] from [place], [name] for assistance with smoking cessation.  I called today to follow up with her regarding smoking cessation and our goal for her to smoke less than 10 cigarettes per day over the past week.    [name] reports she is doing ok.  She said things did not go like she was hoping this week.  She said she got stressed and smoked 15 cigarettes on Wednesday.  Yesterday she said she did really good and only smoked 5 cigarettes, and so far today she has only had 3 cigarettes.  She said craved more than 5 cigarettes yesterday, but she kept telling herself she didn't need it.  She is only smoking outside of the house, and that has prevented her from smoking today because it is raining.  She said she continues to do deep breathing exercises to distract herself.  I congratulated [name] on meeting her goal 6 out of 7 days this week.  She said her biggest trigger is stress.  She said the best thing to keep her mind off the stress and to distract herself is to keep busy with household chores.    [name] reports that she is ready to set a quit date today.  She wants to be cigarette free by November 2nd to surprise her oldest daughter for her birthday.  We set a quit date for one week before her daughter's birthday.  I discussed how setting a quit date around a major holiday or event can often be stressful but she said she has thought a lot about the quit date and that is when she wants to try to quit.  I expressed concern that she would not have her daughter as a support system since she is trying to surprise her, and she said she will still have her younger daughter for support.    Objective:   Current Medications: Current Outpatient Prescriptions  Medication Sig Dispense Refill  .  albuterol (PROVENTIL,VENTOLIN) 90 MCG/ACT inhaler Inhale 1-2 puffs into the lungs every 6 (six) hours as needed for wheezing or shortness of breath.     [name] aspirin EC 81 MG tablet Take 81 mg by mouth daily.     . calcium acetate (PHOSLO) 667 MG capsule Take 667-1,334 mg by mouth 3 (three) times daily with meals. Pt reports taking 1 tab with breakfast and lunch and 2 tabs with dinner.  (but says she rarely eats breakfast or lunch)    . cinacalcet (SENSIPAR) 30 MG tablet Take 30 mg by mouth daily.    . insulin aspart (NOVOLOG) 100 UNIT/ML injection Inject 1-2 Units into the skin 3 (three) times daily as needed for high blood sugar (CBG>150). CBG < 150 = no insulin CBG 150-200 = 1 unit CBG 200-250 = 2 units    . ipratropium-albuterol (DUONEB) 0.5-2.5 (3) MG/3ML SOLN Take 3 mLs by nebulization every 6 (six) hours as needed. J44.1 code (Patient taking differently: Take 3 mLs by nebulization every 6 (six) hours as needed (shortness of breath, wheezing). J44.1 code) 360 mL 1  . levothyroxine (SYNTHROID, LEVOTHROID) 150 MCG tablet Take 150 mcg by mouth daily before breakfast.    . metoprolol tartrate (LOPRESSOR) 25 MG tablet Take 25 mg by mouth 2 (two) times daily.     [name]  Multiple Vitamins-Minerals (MULTIVITAMIN & MINERAL PO) Take 1 tablet by mouth daily.    . nitroGLYCERIN (NITROSTAT) 0.4 MG SL tablet Place 1 tablet (0.4 mg total) under the tongue every 5 (five) minutes as needed for chest pain. 30 tablet 0  . ondansetron (ZOFRAN-ODT) 4 MG disintegrating tablet Take 4 mg by mouth every 8 (eight) hours as needed for nausea or vomiting.    . polyethylene glycol (MIRALAX / GLYCOLAX) packet Take 17 g by mouth daily as needed for mild constipation or moderate constipation.    . tacrolimus (PROGRAF) 1 MG capsule Take 3 mg by mouth 2 (two) times daily.     . traMADol (ULTRAM) 50 MG tablet Take 1 tablet (50 mg total) by mouth every 12 (twelve) hours as needed for moderate pain. 60 tablet 0  . verapamil (CALAN) 120 MG  tablet Take 1 tablet (120 mg total) by mouth at bedtime. NEED OV. 30 tablet 1  . warfarin (COUMADIN) 4 MG tablet Take 1 tablet by mouth daily or as directed by coumadin clinic 90 tablet 0  . diphenhydramine-acetaminophen (TYLENOL PM) 25-500 MG TABS Take 1 tablet by mouth at bedtime as needed (takes approximately once a week).    . predniSONE (DELTASONE) 20 MG tablet Take 50 mg tablet today and taper down by 10 mg daily until completed (Patient not taking: Reported on 09/18/2014) 15 tablet 0   No current facility-administered medications for this visit.    Functional Status: In your present state of health, do you have any difficulty performing the following activities: 09/18/2014 08/28/2014  Hearing? N N  Vision? Y N  Difficulty concentrating or making decisions? N N  Walking or climbing stairs? Y Y  Dressing or bathing? - N  Doing errands, shopping? Tempie Donning  Preparing Food and eating ? N -  Using the Toilet? N -  In the past six months, have you accidently leaked urine? N -  Do you have problems with loss of bowel control? N -  Managing your Medications? N -  Managing your Finances? N -  Housekeeping or managing your Housekeeping? Y -    Fall/Depression Screening: PHQ 2/9 Scores 09/18/2014  PHQ - 2 Score 3  PHQ- 9 Score 10    Assessment: 1.  Smoking cessation:  [name] continues to be motivated to quit smoking.  She met her goal of smoking 9 cigarettes or less over the past week all days except for one day.  [name] said she has thought about a quit date and wants to quit before November 2nd.  She set a quit date for October 26th.  Will continue to work toward slowly reducing the number of cigarettes until her quit date.        Plan: 1. In the next week, [name] will try to smoke 9 cigarettes or less per day.    2. [name] will continue to use distraction techniques such as deep breathing, walking, call someone that loves to talk to distract her from her cigarette cravings.  She  will do something to keep her hands busy when she gets stressed such as dust or mop.   3. [name] set a quit date of October 26th.    4. I will follow up with patient on 10/20/14.   THN CM Care Plan Problem One        Most Recent Value   Care Plan Problem One  Patient is a current smoker and is intersted in quitting  Role Documenting the Problem One  Clinical Pharmacist   Care Plan for Problem One  Active   THN Long Term Goal (31-90 days)  Patient will quit smoking in the next 90 days per patient report.    THN Long Term Goal Start Date  10/13/14   Interventions for Problem One Long Term Goal  I encouraged patient to set a quit date and she set a quit date of October 26th, 2016.  She will work to slowly reduce number of cigarettes per day until her quit date.     THN CM Short Term Goal #1 (0-30 days)  Patient will work to reduce number of cigarettes by 1 per day each week over the next 30 days per patient report.    THN CM Short Term Goal #1 Start Date  10/13/14   Interventions for Short Term Goal #1  I counseled [name] on importance of smoking cessation and starting slow. We talked about distraction techniques to try when she is craving cigarettes (ie deep breathing, listen to hypnosis CD, take a walk)   THN CM Short Term Goal #2 (0-30 days)  Patient will smoke less than 10 cigarettes in the next 7 days per patient report.    THN CM Short Term Goal #2 Start Date  10/06/14   [facility] CM Short Term Goal #2 Met Date  10/13/14 [patient met goal 6 out of 7 days ]   Interventions for Short Term Goal #2  We re-reviewed distraction techniques should she crave more than 10 cigarettes per day.

## 2014-10-14 DIAGNOSIS — N186 End stage renal disease: Secondary | ICD-10-CM | POA: Diagnosis not present

## 2014-10-14 DIAGNOSIS — E118 Type 2 diabetes mellitus with unspecified complications: Secondary | ICD-10-CM | POA: Diagnosis not present

## 2014-10-14 DIAGNOSIS — D631 Anemia in chronic kidney disease: Secondary | ICD-10-CM | POA: Diagnosis not present

## 2014-10-14 DIAGNOSIS — N2581 Secondary hyperparathyroidism of renal origin: Secondary | ICD-10-CM | POA: Diagnosis not present

## 2014-10-17 DIAGNOSIS — I359 Nonrheumatic aortic valve disorder, unspecified: Secondary | ICD-10-CM | POA: Diagnosis not present

## 2014-10-17 DIAGNOSIS — D631 Anemia in chronic kidney disease: Secondary | ICD-10-CM | POA: Diagnosis not present

## 2014-10-17 DIAGNOSIS — E118 Type 2 diabetes mellitus with unspecified complications: Secondary | ICD-10-CM | POA: Diagnosis not present

## 2014-10-17 DIAGNOSIS — N2581 Secondary hyperparathyroidism of renal origin: Secondary | ICD-10-CM | POA: Diagnosis not present

## 2014-10-17 DIAGNOSIS — N186 End stage renal disease: Secondary | ICD-10-CM | POA: Diagnosis not present

## 2014-10-17 LAB — PROTIME-INR: INR: 2.3 — AB (ref 0.9–1.1)

## 2014-10-18 DIAGNOSIS — E1122 Type 2 diabetes mellitus with diabetic chronic kidney disease: Secondary | ICD-10-CM | POA: Diagnosis not present

## 2014-10-18 DIAGNOSIS — I5023 Acute on chronic systolic (congestive) heart failure: Secondary | ICD-10-CM | POA: Diagnosis not present

## 2014-10-18 DIAGNOSIS — J441 Chronic obstructive pulmonary disease with (acute) exacerbation: Secondary | ICD-10-CM | POA: Diagnosis not present

## 2014-10-18 DIAGNOSIS — I120 Hypertensive chronic kidney disease with stage 5 chronic kidney disease or end stage renal disease: Secondary | ICD-10-CM | POA: Diagnosis not present

## 2014-10-18 DIAGNOSIS — N186 End stage renal disease: Secondary | ICD-10-CM | POA: Diagnosis not present

## 2014-10-18 DIAGNOSIS — I2510 Atherosclerotic heart disease of native coronary artery without angina pectoris: Secondary | ICD-10-CM | POA: Diagnosis not present

## 2014-10-19 DIAGNOSIS — N186 End stage renal disease: Secondary | ICD-10-CM | POA: Diagnosis not present

## 2014-10-19 DIAGNOSIS — D631 Anemia in chronic kidney disease: Secondary | ICD-10-CM | POA: Diagnosis not present

## 2014-10-19 DIAGNOSIS — N2581 Secondary hyperparathyroidism of renal origin: Secondary | ICD-10-CM | POA: Diagnosis not present

## 2014-10-19 DIAGNOSIS — E118 Type 2 diabetes mellitus with unspecified complications: Secondary | ICD-10-CM | POA: Diagnosis not present

## 2014-10-20 ENCOUNTER — Other Ambulatory Visit: Payer: Self-pay | Admitting: Pharmacist

## 2014-10-20 ENCOUNTER — Ambulatory Visit (INDEPENDENT_AMBULATORY_CARE_PROVIDER_SITE_OTHER): Payer: Medicare Other | Admitting: Pharmacist Clinician (PhC)/ Clinical Pharmacy Specialist

## 2014-10-20 DIAGNOSIS — Z952 Presence of prosthetic heart valve: Secondary | ICD-10-CM

## 2014-10-20 DIAGNOSIS — Z954 Presence of other heart-valve replacement: Secondary | ICD-10-CM

## 2014-10-20 DIAGNOSIS — Z7901 Long term (current) use of anticoagulants: Secondary | ICD-10-CM

## 2014-10-20 NOTE — Patient Outreach (Signed)
[place] [facility]) Care Management  10/20/2014  [name] 1956-01-04 [id number]   [name] is a 59 year old female who was referred to [place] for assistance with smoking cessation. I called today to follow up with her regarding smoking cessation and our goal for her to smoke 9 cigarettes or less per day over the past week. [name] answered the phone and reports she is doing good, but that she was busy cleaning her house and now was not a good time to talk.  She said she is still working on reducing the number of cigarettes she smokes.  She requested that I call her back next week.  I will plan to reach out [name] again next week.    Elisabeth Most, Pharm.D. Pharmacy Resident [place]

## 2014-10-21 DIAGNOSIS — E118 Type 2 diabetes mellitus with unspecified complications: Secondary | ICD-10-CM | POA: Diagnosis not present

## 2014-10-21 DIAGNOSIS — D631 Anemia in chronic kidney disease: Secondary | ICD-10-CM | POA: Diagnosis not present

## 2014-10-21 DIAGNOSIS — N2581 Secondary hyperparathyroidism of renal origin: Secondary | ICD-10-CM | POA: Diagnosis not present

## 2014-10-21 DIAGNOSIS — N186 End stage renal disease: Secondary | ICD-10-CM | POA: Diagnosis not present

## 2014-10-24 ENCOUNTER — Other Ambulatory Visit: Payer: Self-pay | Admitting: Licensed Clinical Social Worker

## 2014-10-24 DIAGNOSIS — N186 End stage renal disease: Secondary | ICD-10-CM | POA: Diagnosis not present

## 2014-10-24 DIAGNOSIS — D631 Anemia in chronic kidney disease: Secondary | ICD-10-CM | POA: Diagnosis not present

## 2014-10-24 DIAGNOSIS — E118 Type 2 diabetes mellitus with unspecified complications: Secondary | ICD-10-CM | POA: Diagnosis not present

## 2014-10-24 DIAGNOSIS — N2581 Secondary hyperparathyroidism of renal origin: Secondary | ICD-10-CM | POA: Diagnosis not present

## 2014-10-24 NOTE — Patient Outreach (Addendum)
[place] [facility]) Care Management  10/24/2014  KEMESHA MOSEY 1955/05/15 [id number]   Assessment-CSW contacted patient on 10/24/14 and received HIPPA verifications. Patient has met all goals: gaining eye glasses, stable transportation set up with [facility] and mental health referral to [facility]. Patient reports that she has successfully received her glasses and reported "They are really nice and I am now able to see things I haven't been able to see in years." Patient was very appreciative on social work services and assistance. Patient reported no further social work needs at this time and was agreeable to CSW discharging patient of caseload. CSW reminded her that [facility] pharmacist will still be involved.   Plan-CSW will inform Pharmacist Elisabeth Most of case closure.  Eula Fried, BSW, MSW, [email or web address] Phone: [phone] Fax: [phone]

## 2014-10-25 ENCOUNTER — Other Ambulatory Visit: Payer: Self-pay | Admitting: Pharmacist

## 2014-10-25 DIAGNOSIS — I120 Hypertensive chronic kidney disease with stage 5 chronic kidney disease or end stage renal disease: Secondary | ICD-10-CM | POA: Diagnosis not present

## 2014-10-25 DIAGNOSIS — I2510 Atherosclerotic heart disease of native coronary artery without angina pectoris: Secondary | ICD-10-CM | POA: Diagnosis not present

## 2014-10-25 DIAGNOSIS — N186 End stage renal disease: Secondary | ICD-10-CM | POA: Diagnosis not present

## 2014-10-25 DIAGNOSIS — J441 Chronic obstructive pulmonary disease with (acute) exacerbation: Secondary | ICD-10-CM | POA: Diagnosis not present

## 2014-10-25 DIAGNOSIS — E1122 Type 2 diabetes mellitus with diabetic chronic kidney disease: Secondary | ICD-10-CM | POA: Diagnosis not present

## 2014-10-25 DIAGNOSIS — I5023 Acute on chronic systolic (congestive) heart failure: Secondary | ICD-10-CM | POA: Diagnosis not present

## 2014-10-25 NOTE — Patient Outreach (Signed)
[place] [facility]) Care Management  [place]   10/25/2014  ORVILLE WIDMANN 05/19/1955 [id number]  Subjective: [name] is a 59 year old female who was referred to [place] for assistance with smoking cessation.  I called today to follow up with her regarding smoking cessation and our goal for her to smoke 9 cigarettes or less per day over the past week.    Ms. Gathright reports she is doing great but that she is at a "stand still".  She said she is now logging the number of cigarettes she smokes per day and in the past four days she smoked 12 cigarettes on Saturday, 8 on Sunday, 10 on Monday, and 8 on Tuesday.  Based on the last four days, she is smoking 9.5 cigarettes per day on average.  I encouraged her this was still an improvement and still a reduction from the pack per day she was previously smoking.  Her biggest trigger is stress and she reports she smokes less when she is tired (examples include dialysis days or when she goes on walks).    Quit date is set for 12/06/14.    Objective:   Current Medications: Current Outpatient Prescriptions  Medication Sig Dispense Refill  . albuterol (PROVENTIL,VENTOLIN) 90 MCG/ACT inhaler Inhale 1-2 puffs into the lungs every 6 (six) hours as needed for wheezing or shortness of breath.     [name] aspirin EC 81 MG tablet Take 81 mg by mouth daily.     . calcium acetate (PHOSLO) 667 MG capsule Take 667-1,334 mg by mouth 3 (three) times daily with meals. Pt reports taking 1 tab with breakfast and lunch and 2 tabs with dinner.  (but says she rarely eats breakfast or lunch)    . cinacalcet (SENSIPAR) 30 MG tablet Take 30 mg by mouth daily.    . diphenhydramine-acetaminophen (TYLENOL PM) 25-500 MG TABS Take 1 tablet by mouth at bedtime as needed (takes approximately once a week).    . insulin aspart (NOVOLOG) 100 UNIT/ML injection Inject 1-2 Units into the skin 3 (three) times daily as needed for high blood sugar (CBG>150). CBG < 150 =  no insulin CBG 150-200 = 1 unit CBG 200-250 = 2 units    . ipratropium-albuterol (DUONEB) 0.5-2.5 (3) MG/3ML SOLN Take 3 mLs by nebulization every 6 (six) hours as needed. J44.1 code (Patient taking differently: Take 3 mLs by nebulization every 6 (six) hours as needed (shortness of breath, wheezing). J44.1 code) 360 mL 1  . levothyroxine (SYNTHROID, LEVOTHROID) 150 MCG tablet Take 150 mcg by mouth daily before breakfast.    . metoprolol tartrate (LOPRESSOR) 25 MG tablet Take 25 mg by mouth 2 (two) times daily.     . Multiple Vitamins-Minerals (MULTIVITAMIN & MINERAL PO) Take 1 tablet by mouth daily.    . nitroGLYCERIN (NITROSTAT) 0.4 MG SL tablet Place 1 tablet (0.4 mg total) under the tongue every 5 (five) minutes as needed for chest pain. 30 tablet 0  . ondansetron (ZOFRAN-ODT) 4 MG disintegrating tablet Take 4 mg by mouth every 8 (eight) hours as needed for nausea or vomiting.    . polyethylene glycol (MIRALAX / GLYCOLAX) packet Take 17 g by mouth daily as needed for mild constipation or moderate constipation.    . predniSONE (DELTASONE) 20 MG tablet Take 50 mg tablet today and taper down by 10 mg daily until completed (Patient not taking: Reported on 09/18/2014) 15 tablet 0  . tacrolimus (PROGRAF) 1 MG capsule Take 3  mg by mouth 2 (two) times daily.     . traMADol (ULTRAM) 50 MG tablet Take 1 tablet (50 mg total) by mouth every 12 (twelve) hours as needed for moderate pain. 60 tablet 0  . verapamil (CALAN) 120 MG tablet Take 1 tablet (120 mg total) by mouth at bedtime. NEED OV. 30 tablet 1  . warfarin (COUMADIN) 4 MG tablet Take 1 tablet by mouth daily or as directed by coumadin clinic 90 tablet 0   No current facility-administered medications for this visit.    Functional Status: In your present state of health, do you have any difficulty performing the following activities: 09/18/2014 08/28/2014  Hearing? N N  Vision? Y N  Difficulty concentrating or making decisions? N N  Walking or  climbing stairs? Y Y  Dressing or bathing? - N  Doing errands, shopping? Tempie Donning  Preparing Food and eating ? N -  Using the Toilet? N -  In the past six months, have you accidently leaked urine? N -  Do you have problems with loss of bowel control? N -  Managing your Medications? N -  Managing your Finances? N -  Housekeeping or managing your Housekeeping? Y -    Fall/Depression Screening: PHQ 2/9 Scores 09/18/2014  PHQ - 2 Score 3  PHQ- 9 Score 10   Assessment: 1.  Smoking cessation:  Ms. Mells continues to be motivated to quit smoking by October 26th but reports she is having difficulty in cutting back to less than 8 cigarettes per day.  Will continue to work toward slowly reducing the number of cigarettes until her quit date.        Plan: 1. Patient's goal is to smoke 8 cigarettes or less per day over the next two weeks. 2. Ms. Pfister will continue to walk as a distraction technique 3. Ms. Odor has a quit date of October 26th    4. I will follow up with patient in two weeks.   THN CM Care Plan Problem One        Most Recent Value   Care Plan Problem One  Patient is a current smoker and is intersted in quitting   Role Documenting the Problem One  [place] for Problem One  Active   THN Long Term Goal (31-90 days)  Patient will quit smoking in the next 90 days per patient report.    THN Long Term Goal Start Date  10/13/14   Interventions for Problem One Long Term Goal  Patient continues to aim for quit date of 12/06/14.  She feels she is at a stand still right now and cannot seem to get below 8 cigarettes per day.  She will continue to work toward slowly reducing the number of cigarettes per day.    THN CM Short Term Goal #1 (0-30 days)  Patient will work to reduce number of cigarettes by 1 per day each week over the next 30 days per patient report.    THN CM Short Term Goal #1 Start Date  10/13/14   Interventions for Short Term Goal #1  I counseled Ms. Spurgeon  on importance of smoking cessation and starting slow. We talked about distraction techniques to try when she is craving cigarettes (ie deep breathing, listen to hypnosis CD, take a walk)   THN CM Short Term Goal #2 (0-30 days)  Patient will smoke less than 10 cigarettes in the next 7 days per patient report.    THN  CM Short Term Goal #2 Start Date  10/06/14   [facility] CM Short Term Goal #2 Met Date  10/13/14 [patient met goal 6 out of 7 days ]   Interventions for Short Term Goal #2  We re-reviewed distraction techniques should she crave more than 10 cigarettes per day.       Elisabeth Most, Pharm.D. Pharmacy Resident [place]

## 2014-10-26 DIAGNOSIS — N186 End stage renal disease: Secondary | ICD-10-CM | POA: Diagnosis not present

## 2014-10-26 DIAGNOSIS — N2581 Secondary hyperparathyroidism of renal origin: Secondary | ICD-10-CM | POA: Diagnosis not present

## 2014-10-26 DIAGNOSIS — D631 Anemia in chronic kidney disease: Secondary | ICD-10-CM | POA: Diagnosis not present

## 2014-10-26 DIAGNOSIS — E118 Type 2 diabetes mellitus with unspecified complications: Secondary | ICD-10-CM | POA: Diagnosis not present

## 2014-10-28 DIAGNOSIS — E118 Type 2 diabetes mellitus with unspecified complications: Secondary | ICD-10-CM | POA: Diagnosis not present

## 2014-10-28 DIAGNOSIS — N186 End stage renal disease: Secondary | ICD-10-CM | POA: Diagnosis not present

## 2014-10-28 DIAGNOSIS — N2581 Secondary hyperparathyroidism of renal origin: Secondary | ICD-10-CM | POA: Diagnosis not present

## 2014-10-28 DIAGNOSIS — D631 Anemia in chronic kidney disease: Secondary | ICD-10-CM | POA: Diagnosis not present

## 2014-10-30 ENCOUNTER — Ambulatory Visit (HOSPITAL_BASED_OUTPATIENT_CLINIC_OR_DEPARTMENT_OTHER): Payer: Medicare Other

## 2014-10-31 DIAGNOSIS — D631 Anemia in chronic kidney disease: Secondary | ICD-10-CM | POA: Diagnosis not present

## 2014-10-31 DIAGNOSIS — E118 Type 2 diabetes mellitus with unspecified complications: Secondary | ICD-10-CM | POA: Diagnosis not present

## 2014-10-31 DIAGNOSIS — N186 End stage renal disease: Secondary | ICD-10-CM | POA: Diagnosis not present

## 2014-10-31 DIAGNOSIS — I359 Nonrheumatic aortic valve disorder, unspecified: Secondary | ICD-10-CM | POA: Diagnosis not present

## 2014-10-31 DIAGNOSIS — N2581 Secondary hyperparathyroidism of renal origin: Secondary | ICD-10-CM | POA: Diagnosis not present

## 2014-10-31 LAB — PROTIME-INR: INR: 3.4 — AB (ref 0.9–1.1)

## 2014-11-02 ENCOUNTER — Ambulatory Visit (INDEPENDENT_AMBULATORY_CARE_PROVIDER_SITE_OTHER): Payer: Medicare Other | Admitting: Pharmacist Clinician (PhC)/ Clinical Pharmacy Specialist

## 2014-11-02 DIAGNOSIS — Z7901 Long term (current) use of anticoagulants: Secondary | ICD-10-CM

## 2014-11-02 DIAGNOSIS — E118 Type 2 diabetes mellitus with unspecified complications: Secondary | ICD-10-CM | POA: Diagnosis not present

## 2014-11-02 DIAGNOSIS — D631 Anemia in chronic kidney disease: Secondary | ICD-10-CM | POA: Diagnosis not present

## 2014-11-02 DIAGNOSIS — Z952 Presence of prosthetic heart valve: Secondary | ICD-10-CM

## 2014-11-02 DIAGNOSIS — N186 End stage renal disease: Secondary | ICD-10-CM | POA: Diagnosis not present

## 2014-11-02 DIAGNOSIS — Z954 Presence of other heart-valve replacement: Secondary | ICD-10-CM

## 2014-11-02 DIAGNOSIS — N2581 Secondary hyperparathyroidism of renal origin: Secondary | ICD-10-CM | POA: Diagnosis not present

## 2014-11-04 DIAGNOSIS — D631 Anemia in chronic kidney disease: Secondary | ICD-10-CM | POA: Diagnosis not present

## 2014-11-04 DIAGNOSIS — N186 End stage renal disease: Secondary | ICD-10-CM | POA: Diagnosis not present

## 2014-11-04 DIAGNOSIS — E118 Type 2 diabetes mellitus with unspecified complications: Secondary | ICD-10-CM | POA: Diagnosis not present

## 2014-11-04 DIAGNOSIS — N2581 Secondary hyperparathyroidism of renal origin: Secondary | ICD-10-CM | POA: Diagnosis not present

## 2014-11-07 DIAGNOSIS — N186 End stage renal disease: Secondary | ICD-10-CM | POA: Diagnosis not present

## 2014-11-07 DIAGNOSIS — E118 Type 2 diabetes mellitus with unspecified complications: Secondary | ICD-10-CM | POA: Diagnosis not present

## 2014-11-07 DIAGNOSIS — N2581 Secondary hyperparathyroidism of renal origin: Secondary | ICD-10-CM | POA: Diagnosis not present

## 2014-11-07 DIAGNOSIS — D631 Anemia in chronic kidney disease: Secondary | ICD-10-CM | POA: Diagnosis not present

## 2014-11-07 DIAGNOSIS — I359 Nonrheumatic aortic valve disorder, unspecified: Secondary | ICD-10-CM | POA: Diagnosis not present

## 2014-11-07 LAB — PROTIME-INR: INR: 3.5 — AB (ref 0.9–1.1)

## 2014-11-08 ENCOUNTER — Other Ambulatory Visit: Payer: Self-pay | Admitting: Pharmacist

## 2014-11-08 DIAGNOSIS — Z5181 Encounter for therapeutic drug level monitoring: Secondary | ICD-10-CM | POA: Diagnosis not present

## 2014-11-08 DIAGNOSIS — Z944 Liver transplant status: Secondary | ICD-10-CM | POA: Diagnosis not present

## 2014-11-08 DIAGNOSIS — B182 Chronic viral hepatitis C: Secondary | ICD-10-CM | POA: Diagnosis not present

## 2014-11-08 NOTE — Patient Outreach (Signed)
[place] [facility]) Care Management  [place]   11/08/2014  [name] 1955-03-15 [id number]  Subjective: [name] is a 59 year old female who was referred to [place] for assistance with smoking cessation.  I called today to follow up with her regarding smoking cessation and our goal for her to smoke 8 cigarettes or less per day over the past two weeks.  [name] reports she is doing pretty good today, but she said she has not done a very good job of meeting her goal these past two weeks.  She said she did really good last week, but that she has had a very stressful week and is back to 15 cigarettes per day.  She reports she has been very anxious lately and has an appointment with her PCP tomorrow and plans to talk to her about her anxiety.  She said she has also had a lot of nicotine cravings lately.  She said she has tried many types of distraction methods but nothing has helped.  She said she plans to talk to her PCP about medication options to assist with smoking cessation at her visit tomorrow.  Patient states that despite the setback, she has not given up on quitting smoking.  She still wants to work toward a quit date of 12/06/14.    Objective:   Current Medications: Current Outpatient Prescriptions  Medication Sig Dispense Refill  . albuterol (PROVENTIL,VENTOLIN) 90 MCG/ACT inhaler Inhale 1-2 puffs into the lungs every 6 (six) hours as needed for wheezing or shortness of breath.     [name] aspirin EC 81 MG tablet Take 81 mg by mouth daily.     . calcium acetate (PHOSLO) 667 MG capsule Take 667-1,334 mg by mouth 3 (three) times daily with meals. Pt reports taking 1 tab with breakfast and lunch and 2 tabs with dinner.  (but says she rarely eats breakfast or lunch)    . cinacalcet (SENSIPAR) 30 MG tablet Take 30 mg by mouth daily.    . diphenhydramine-acetaminophen (TYLENOL PM) 25-500 MG TABS Take 1 tablet by mouth at bedtime as needed (takes approximately once  a week).    . insulin aspart (NOVOLOG) 100 UNIT/ML injection Inject 1-2 Units into the skin 3 (three) times daily as needed for high blood sugar (CBG>150). CBG < 150 = no insulin CBG 150-200 = 1 unit CBG 200-250 = 2 units    . ipratropium-albuterol (DUONEB) 0.5-2.5 (3) MG/3ML SOLN Take 3 mLs by nebulization every 6 (six) hours as needed. J44.1 code (Patient taking differently: Take 3 mLs by nebulization every 6 (six) hours as needed (shortness of breath, wheezing). J44.1 code) 360 mL 1  . levothyroxine (SYNTHROID, LEVOTHROID) 150 MCG tablet Take 150 mcg by mouth daily before breakfast.    . metoprolol tartrate (LOPRESSOR) 25 MG tablet Take 25 mg by mouth 2 (two) times daily.     . Multiple Vitamins-Minerals (MULTIVITAMIN & MINERAL PO) Take 1 tablet by mouth daily.    . nitroGLYCERIN (NITROSTAT) 0.4 MG SL tablet Place 1 tablet (0.4 mg total) under the tongue every 5 (five) minutes as needed for chest pain. 30 tablet 0  . ondansetron (ZOFRAN-ODT) 4 MG disintegrating tablet Take 4 mg by mouth every 8 (eight) hours as needed for nausea or vomiting.    . polyethylene glycol (MIRALAX / GLYCOLAX) packet Take 17 g by mouth daily as needed for mild constipation or moderate constipation.    . predniSONE (DELTASONE) 20 MG tablet Take  50 mg tablet today and taper down by 10 mg daily until completed (Patient not taking: Reported on 09/18/2014) 15 tablet 0  . tacrolimus (PROGRAF) 1 MG capsule Take 3 mg by mouth 2 (two) times daily.     . traMADol (ULTRAM) 50 MG tablet Take 1 tablet (50 mg total) by mouth every 12 (twelve) hours as needed for moderate pain. 60 tablet 0  . verapamil (CALAN) 120 MG tablet Take 1 tablet (120 mg total) by mouth at bedtime. NEED OV. 30 tablet 1  . warfarin (COUMADIN) 4 MG tablet Take 1 tablet by mouth daily or as directed by coumadin clinic 90 tablet 0   No current facility-administered medications for this visit.    Functional Status: In your present state of health, do you have any  difficulty performing the following activities: 09/18/2014 08/28/2014  Hearing? N N  Vision? Y N  Difficulty concentrating or making decisions? N N  Walking or climbing stairs? Y Y  Dressing or bathing? - N  Doing errands, shopping? [name]  Preparing Food and eating ? N -  Using the Toilet? N -  In the past six months, have you accidently leaked urine? N -  Do you have problems with loss of bowel control? N -  Managing your Medications? N -  Managing your Finances? N -  Housekeeping or managing your Housekeeping? Y -    Fall/Depression Screening: PHQ 2/9 Scores 09/18/2014  PHQ - 2 Score 3  PHQ- 9 Score 10    Assessment: 1.  Smoking cessation:  [name] continues to be motivated to quit smoking by October 26th but reports she is having difficulty this past week.  Will continue to work toward slowly reducing the number of cigarettes until her quit date.        Plan: 1. Patient will work toward cutting back down on number of cigarettes again.   2. Patient will discuss medication options to assist in smoking cessation with her PCP at her appointment on 11/09/14.   3.  [name] will continue to work toward a quit date of October 26th    4.  I will follow up with patient to see how her PCP visit goes.    THN CM Care Plan Problem One        Most Recent Value   Care Plan Problem One  Patient is a current smoker and is intersted in quitting   Role Documenting the Problem One  [place] for Problem One  Active   THN Long Term Goal (31-90 days)  Patient will quit smoking in the next 90 days per patient report.    THN Long Term Goal Start Date  10/13/14   Interventions for Problem One Long Term Goal  Patient is still interested in quitting smoking and is aiming for a quit date of 12/06/14.  She reports having a bad week and smoking ~15 cigarettes per day but is going to work to reduce the number of cigarettes again.    THN CM Short Term Goal #1 (0-30 days)  Patient will  work to reduce number of cigarettes by 1 per day each week over the next 30 days per patient report.    THN CM Short Term Goal #1 Start Date  10/13/14   [facility] CM Short Term Goal #1 Met Date  11/08/14 [goal not met]   Interventions for Short Term Goal #1  I counseled Ms. Cespedes on importance of smoking cessation  and starting slow. We talked about distraction techniques to try when she is craving cigarettes (ie deep breathing, listen to hypnosis CD, take a walk)     Elisabeth Most, Pharm.D. Pharmacy Resident [place]

## 2014-11-09 ENCOUNTER — Ambulatory Visit (INDEPENDENT_AMBULATORY_CARE_PROVIDER_SITE_OTHER): Payer: Medicare Other | Admitting: Pharmacist Clinician (PhC)/ Clinical Pharmacy Specialist

## 2014-11-09 DIAGNOSIS — N186 End stage renal disease: Secondary | ICD-10-CM | POA: Diagnosis not present

## 2014-11-09 DIAGNOSIS — Z7901 Long term (current) use of anticoagulants: Secondary | ICD-10-CM

## 2014-11-09 DIAGNOSIS — Z952 Presence of prosthetic heart valve: Secondary | ICD-10-CM

## 2014-11-09 DIAGNOSIS — E118 Type 2 diabetes mellitus with unspecified complications: Secondary | ICD-10-CM | POA: Diagnosis not present

## 2014-11-09 DIAGNOSIS — D631 Anemia in chronic kidney disease: Secondary | ICD-10-CM | POA: Diagnosis not present

## 2014-11-09 DIAGNOSIS — I359 Nonrheumatic aortic valve disorder, unspecified: Secondary | ICD-10-CM | POA: Diagnosis not present

## 2014-11-09 DIAGNOSIS — Z954 Presence of other heart-valve replacement: Secondary | ICD-10-CM

## 2014-11-09 DIAGNOSIS — N2581 Secondary hyperparathyroidism of renal origin: Secondary | ICD-10-CM | POA: Diagnosis not present

## 2014-11-10 ENCOUNTER — Other Ambulatory Visit: Payer: Self-pay | Admitting: Pharmacist

## 2014-11-10 DIAGNOSIS — T8640 Unspecified complication of liver transplant: Secondary | ICD-10-CM | POA: Diagnosis not present

## 2014-11-10 DIAGNOSIS — N186 End stage renal disease: Secondary | ICD-10-CM | POA: Diagnosis not present

## 2014-11-10 DIAGNOSIS — Z992 Dependence on renal dialysis: Secondary | ICD-10-CM | POA: Diagnosis not present

## 2014-11-10 IMAGING — CR DG CHEST 2V
2 series · 2 of 2 positions shown · non-contrast
Comparison: 07/18/2012 chest x-ray at [facility].

CLINICAL DATA: Cough, chest pain and shortness of breath.

CHEST - 2 VIEW

[w chest pa]
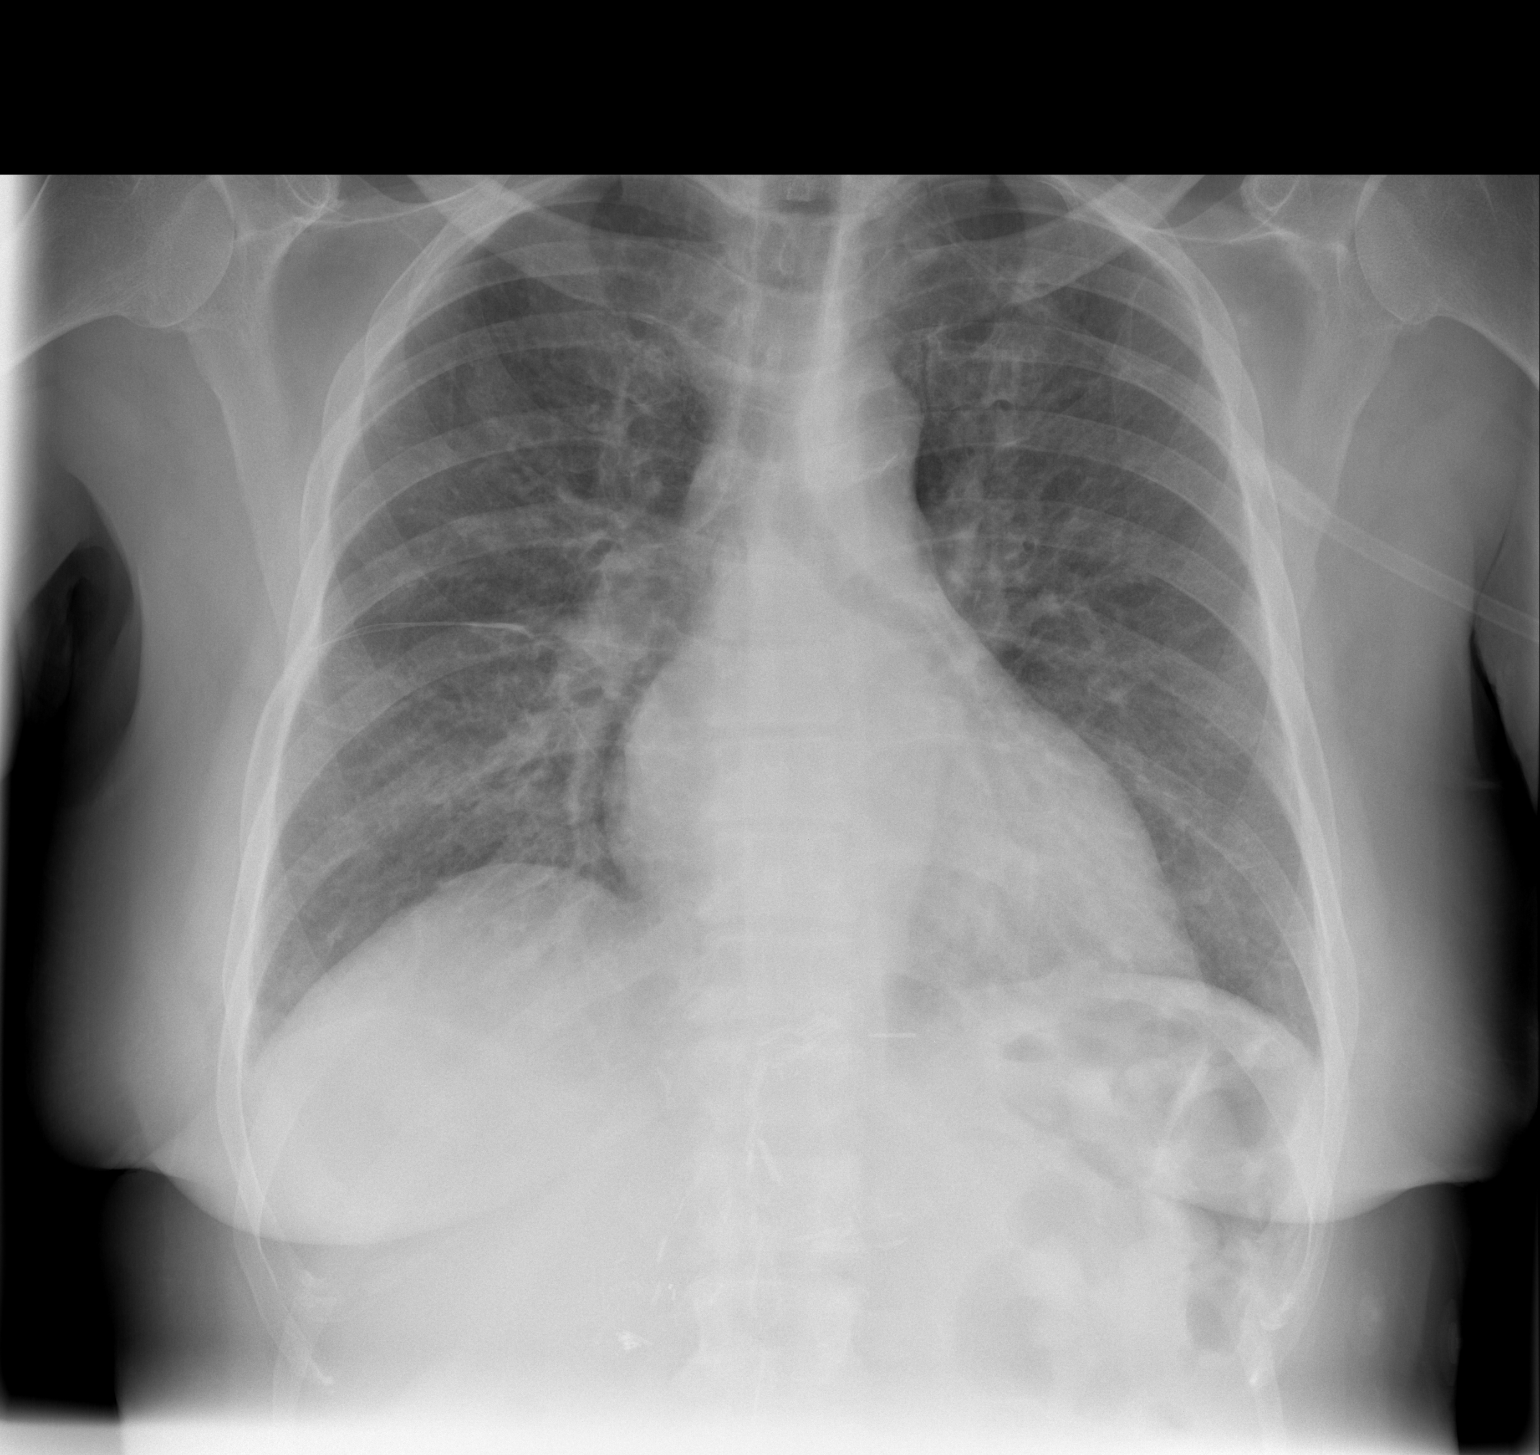

[w chest lat]
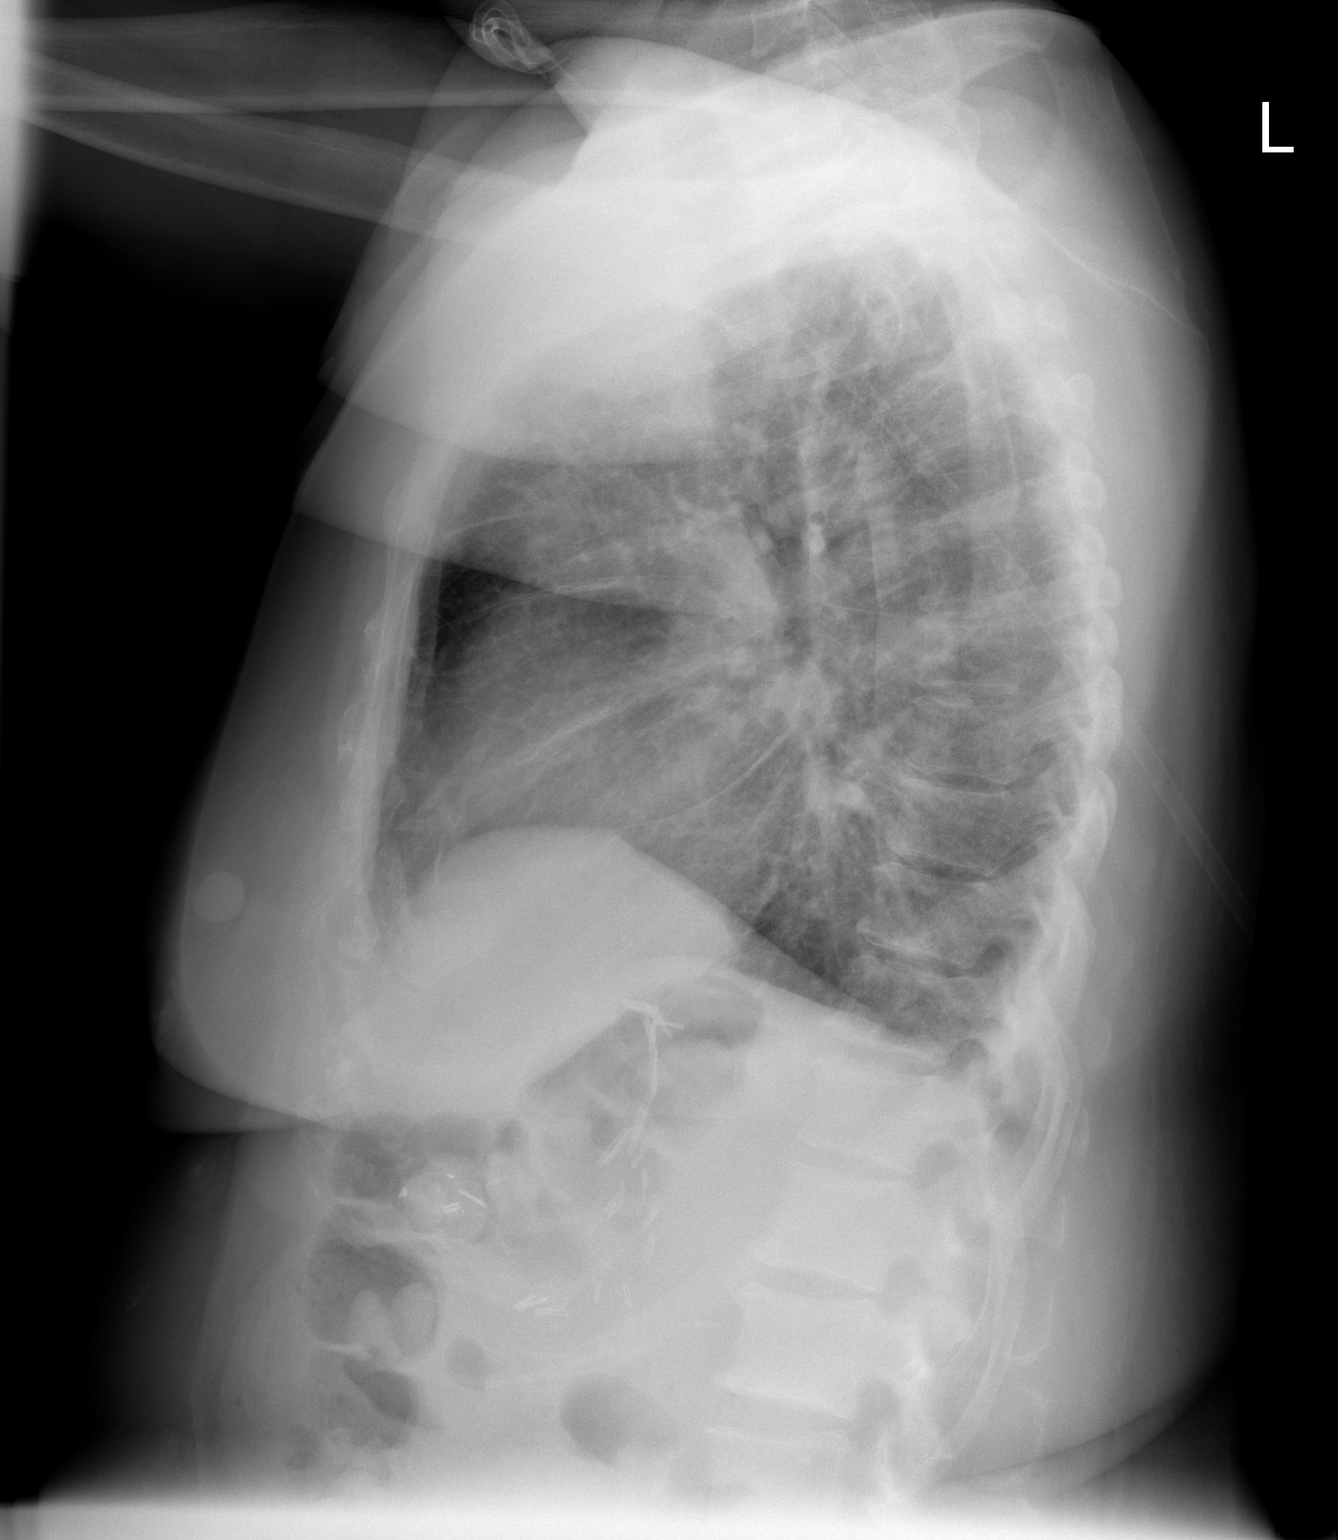

[2 of 2 positions shown; findings below may reference images not displayed]

FINDINGS: The pulmonary vascular and interstitial prominence again
noted.  Some of this appearance is chronic.  There may be a
component of mild interstitial edema or atypical interstitial
infiltrates.  No focal pulmonary consolidation is identified.
There is no evidence of pleural fluid.  Heart size is normal.
IMPRESSION: Persistent pulmonary vascular and interstitial prominence.  There
may be a component of mild interstitial edema.

## 2014-11-10 NOTE — Patient Outreach (Signed)
[facility]) Care Management  [place]   11/10/2014  FATINA SPRANKLE Dec 19, 1955 [id number]  Subjective: [name] is a 59 year old female who was referred to [facility] for assistance with smoking cessation.  I called Ms. Rape today to follow up after her PCP appointment on 11/09/14.  Patient states her appointment went well and she did talk to her PCP about medication to assist with smoking cessation.  Patient was previously resistant to using nicotine patches as she had used them before and did not believe they worked; however, she said she is now willing to try using nicotine patches.  She said last time she used nicotine patches, she used the 14mg  strength, and this time she was prescribed the 21mg  patches.  Patient states she is ready to use the patches as soon as she picks them up from the pharmacy.  I counseled patient that she should not smoke while using the nicotine patches.  Patient voiced understanding.  Patient had previously set a quit date for October 26th, but now states she is ready to quit as soon as she gets the patches.  She said she is waiting on money from her daughter in order to purchase the nicotine patches.  We discussed the many insurances do not cover nicotine patches since they are over the counter, but that 1-800-QUIT-NOW may be able to assist patient in getting nicotine patches if she cannot afford them.    Objective:   Current Medications: Current Outpatient Prescriptions  Medication Sig Dispense Refill  . albuterol (PROVENTIL,VENTOLIN) 90 MCG/ACT inhaler Inhale 1-2 puffs into the lungs every 6 (six) hours as needed for wheezing or shortness of breath.     [name] aspirin EC 81 MG tablet Take 81 mg by mouth daily.     . calcium acetate (PHOSLO) 667 MG capsule Take 667-1,334 mg by mouth 3 (three) times daily with meals. Pt reports taking 1 tab with breakfast and lunch and 2 tabs with dinner.  (but says she rarely eats breakfast or lunch)     . cinacalcet (SENSIPAR) 30 MG tablet Take 30 mg by mouth daily.    . diphenhydramine-acetaminophen (TYLENOL PM) 25-500 MG TABS Take 1 tablet by mouth at bedtime as needed (takes approximately once a week).    . insulin aspart (NOVOLOG) 100 UNIT/ML injection Inject 1-2 Units into the skin 3 (three) times daily as needed for high blood sugar (CBG>150). CBG < 150 = no insulin CBG 150-200 = 1 unit CBG 200-250 = 2 units    . ipratropium-albuterol (DUONEB) 0.5-2.5 (3) MG/3ML SOLN Take 3 mLs by nebulization every 6 (six) hours as needed. J44.1 code (Patient taking differently: Take 3 mLs by nebulization every 6 (six) hours as needed (shortness of breath, wheezing). J44.1 code) 360 mL 1  . levothyroxine (SYNTHROID, LEVOTHROID) 150 MCG tablet Take 150 mcg by mouth daily before breakfast.    . metoprolol tartrate (LOPRESSOR) 25 MG tablet Take 25 mg by mouth 2 (two) times daily.     . Multiple Vitamins-Minerals (MULTIVITAMIN & MINERAL PO) Take 1 tablet by mouth daily.    . nitroGLYCERIN (NITROSTAT) 0.4 MG SL tablet Place 1 tablet (0.4 mg total) under the tongue every 5 (five) minutes as needed for chest pain. 30 tablet 0  . ondansetron (ZOFRAN-ODT) 4 MG disintegrating tablet Take 4 mg by mouth every 8 (eight) hours as needed for nausea or vomiting.    . polyethylene glycol (MIRALAX / GLYCOLAX) packet Take 17 g  by mouth daily as needed for mild constipation or moderate constipation.    . predniSONE (DELTASONE) 20 MG tablet Take 50 mg tablet today and taper down by 10 mg daily until completed (Patient not taking: Reported on 09/18/2014) 15 tablet 0  . tacrolimus (PROGRAF) 1 MG capsule Take 3 mg by mouth 2 (two) times daily.     . traMADol (ULTRAM) 50 MG tablet Take 1 tablet (50 mg total) by mouth every 12 (twelve) hours as needed for moderate pain. 60 tablet 0  . verapamil (CALAN) 120 MG tablet Take 1 tablet (120 mg total) by mouth at bedtime. NEED OV. 30 tablet 1  . warfarin (COUMADIN) 4 MG tablet Take 1 tablet  by mouth daily or as directed by coumadin clinic 90 tablet 0   No current facility-administered medications for this visit.    Functional Status: In your present state of health, do you have any difficulty performing the following activities: 09/18/2014 08/28/2014  Hearing? N N  Vision? Y N  Difficulty concentrating or making decisions? N N  Walking or climbing stairs? Y Y  Dressing or bathing? - N  Doing errands, shopping? [place] Donning  Preparing Food and eating ? N -  Using the Toilet? N -  In the past six months, have you accidently leaked urine? N -  Do you have problems with loss of bowel control? N -  Managing your Medications? N -  Managing your Finances? N -  Housekeeping or managing your Housekeeping? Y -    Fall/Depression Screening: PHQ 2/9 Scores 09/18/2014  PHQ - 2 Score 3  PHQ- 9 Score 10    Assessment: 1.  Smoking cessation:  Ms. Mort continues to be motivated to quit smoking.  She now plans to use nicotine patches to assist with nicotine cravings, and plans to quit as soon as she gets the patches.  I provided patient with 1-800-QUIT-NOW to assist with obtaining nicotine patches.    Plan: 1.  Patient will call 1-800-QUIT-NOW regarding nicotine patches.  2.  I will follow up with patient on 11/20/14.     THN CM Care Plan Problem One        Most Recent Value   Care Plan Problem One  Patient is a current smoker and is intersted in quitting   Role Documenting the Problem One  [place] for Problem One  Active   THN Long Term Goal (31-90 days)  Patient will quit smoking in the next 90 days per patient report.    THN Long Term Goal Start Date  10/13/14   Interventions for Problem One Long Term Goal  Patient is now intersted in using nicotine patches to assist with smoking cessation.  Provided patient with 1-800-QUIT-NOW resource.  Patient will call 1-800-QUIT-NOW regarding patches.       Elisabeth Most, Pharm.D. Pharmacy Resident [place] [phone]

## 2014-11-11 DIAGNOSIS — N2581 Secondary hyperparathyroidism of renal origin: Secondary | ICD-10-CM | POA: Diagnosis not present

## 2014-11-11 DIAGNOSIS — E118 Type 2 diabetes mellitus with unspecified complications: Secondary | ICD-10-CM | POA: Diagnosis not present

## 2014-11-11 DIAGNOSIS — N186 End stage renal disease: Secondary | ICD-10-CM | POA: Diagnosis not present

## 2014-11-11 DIAGNOSIS — Z23 Encounter for immunization: Secondary | ICD-10-CM | POA: Diagnosis not present

## 2014-11-14 DIAGNOSIS — N186 End stage renal disease: Secondary | ICD-10-CM | POA: Diagnosis not present

## 2014-11-14 DIAGNOSIS — Z944 Liver transplant status: Secondary | ICD-10-CM | POA: Diagnosis not present

## 2014-11-14 DIAGNOSIS — Z23 Encounter for immunization: Secondary | ICD-10-CM | POA: Diagnosis not present

## 2014-11-14 DIAGNOSIS — E118 Type 2 diabetes mellitus with unspecified complications: Secondary | ICD-10-CM | POA: Diagnosis not present

## 2014-11-14 DIAGNOSIS — Z5181 Encounter for therapeutic drug level monitoring: Secondary | ICD-10-CM | POA: Diagnosis not present

## 2014-11-14 DIAGNOSIS — N2581 Secondary hyperparathyroidism of renal origin: Secondary | ICD-10-CM | POA: Diagnosis not present

## 2014-11-14 DIAGNOSIS — I359 Nonrheumatic aortic valve disorder, unspecified: Secondary | ICD-10-CM | POA: Diagnosis not present

## 2014-11-15 LAB — PROTIME-INR: INR: 2.2 — AB (ref 0.9–1.1)

## 2014-11-16 ENCOUNTER — Ambulatory Visit (INDEPENDENT_AMBULATORY_CARE_PROVIDER_SITE_OTHER): Payer: Medicare Other | Admitting: Pharmacist Clinician (PhC)/ Clinical Pharmacy Specialist

## 2014-11-16 DIAGNOSIS — N186 End stage renal disease: Secondary | ICD-10-CM | POA: Diagnosis not present

## 2014-11-16 DIAGNOSIS — Z954 Presence of other heart-valve replacement: Secondary | ICD-10-CM | POA: Diagnosis not present

## 2014-11-16 DIAGNOSIS — Z23 Encounter for immunization: Secondary | ICD-10-CM | POA: Diagnosis not present

## 2014-11-16 DIAGNOSIS — E118 Type 2 diabetes mellitus with unspecified complications: Secondary | ICD-10-CM | POA: Diagnosis not present

## 2014-11-16 DIAGNOSIS — Z7901 Long term (current) use of anticoagulants: Secondary | ICD-10-CM | POA: Diagnosis not present

## 2014-11-16 DIAGNOSIS — Z952 Presence of prosthetic heart valve: Secondary | ICD-10-CM

## 2014-11-16 DIAGNOSIS — N2581 Secondary hyperparathyroidism of renal origin: Secondary | ICD-10-CM | POA: Diagnosis not present

## 2014-11-18 DIAGNOSIS — N186 End stage renal disease: Secondary | ICD-10-CM | POA: Diagnosis not present

## 2014-11-18 DIAGNOSIS — N2581 Secondary hyperparathyroidism of renal origin: Secondary | ICD-10-CM | POA: Diagnosis not present

## 2014-11-18 DIAGNOSIS — E118 Type 2 diabetes mellitus with unspecified complications: Secondary | ICD-10-CM | POA: Diagnosis not present

## 2014-11-18 DIAGNOSIS — Z23 Encounter for immunization: Secondary | ICD-10-CM | POA: Diagnosis not present

## 2014-11-18 IMAGING — CR DG CHEST 1V
1 series · 1 of 1 positions shown · non-contrast
Comparison: Portable exam 0714 hours compared to 07/27/2012

CLINICAL DATA: Follow up pulmonary edema, shortness of breath,
patient feeling better

CHEST - 1 VIEW

[w chest pa]
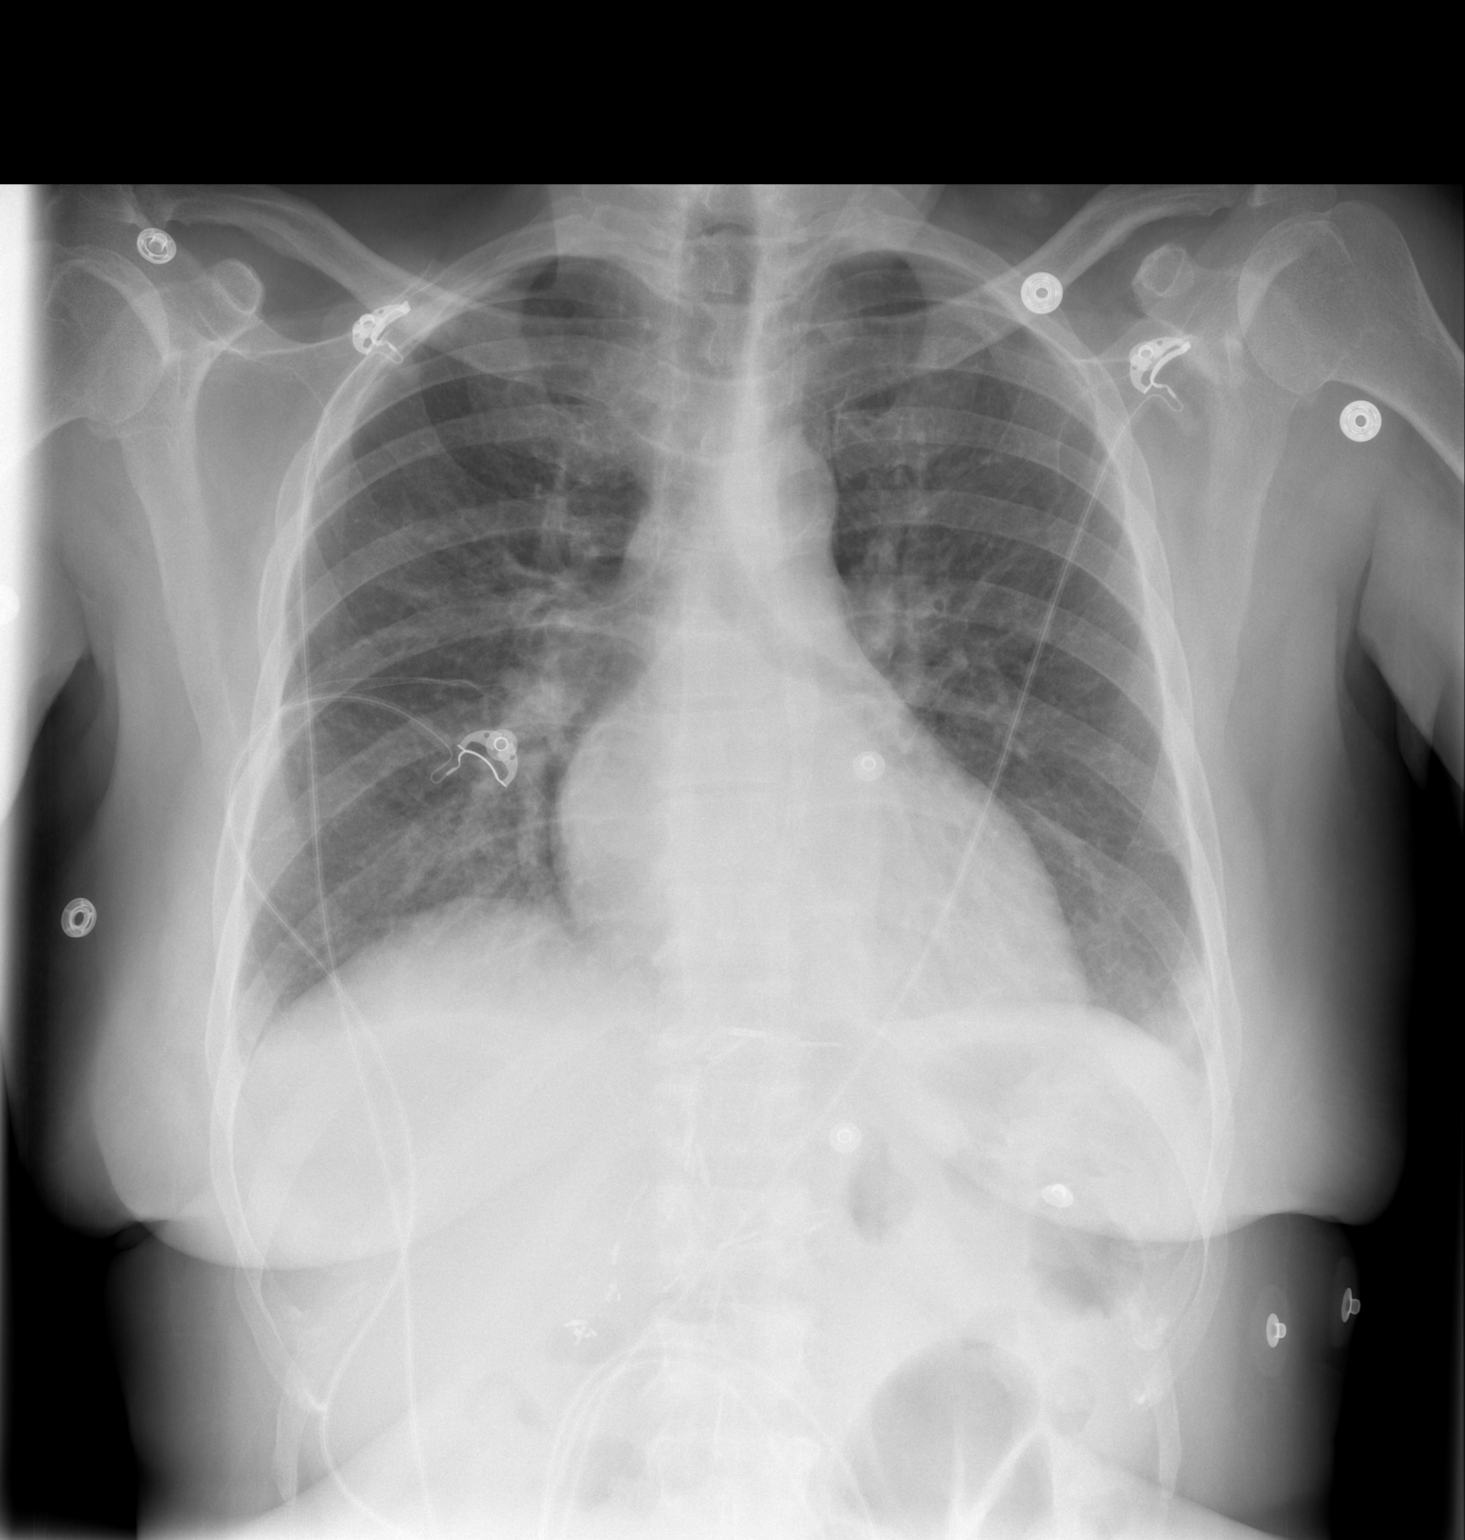

[1 of 1 positions shown; findings below may reference images not displayed]

FINDINGS: Minimal enlargement of cardiac silhouette with pulmonary vascular
congestion.
Significantly improved pulmonary edema.
Minimal bibasilar effusions.
No pneumothorax.
IMPRESSION: Significantly improved pulmonary edema.

## 2014-11-20 ENCOUNTER — Other Ambulatory Visit: Payer: Self-pay | Admitting: Pharmacist

## 2014-11-20 NOTE — Patient Outreach (Signed)
[facility]) Care Management  [place]   11/20/2014  [name] [birth year] [id number]  Subjective: [name] is a 59yo who I am following for smoking cessation.  I called patient today to follow up on whether she was able to get nicotine patches through 1-800-QUITNOW.  Patient states she is doing great.  Patient states she called 1-800-QUIT now on 11/10/14 and she said they are mailing her nicotine patches and lozenges.  They are supposed to arrive within 2 weeks.  Patient is hopeful she will get them in the mail this week.  Patient says she plans to quit smoking and starting using the nicotine replacement therapy as soon as the nicotine patches and lozenges are delivered.  Discussed importance of removing all cigarettes from the home once she quits smoking to prevent temptation.  Patient states she plans to give all her cigarettes to her neighbor and she also plans to clean her home to help get rid of the smell of smoke.     Objective:   Current Medications: Current Outpatient Prescriptions  Medication Sig Dispense Refill  . albuterol (PROVENTIL,VENTOLIN) 90 MCG/ACT inhaler Inhale 1-2 puffs into the lungs every 6 (six) hours as needed for wheezing or shortness of breath.     [name] aspirin EC 81 MG tablet Take 81 mg by mouth daily.     . calcium acetate (PHOSLO) 667 MG capsule Take 667-1,334 mg by mouth 3 (three) times daily with meals. Pt reports taking 1 tab with breakfast and lunch and 2 tabs with dinner.  (but says she rarely eats breakfast or lunch)    . cinacalcet (SENSIPAR) 30 MG tablet Take 30 mg by mouth daily.    . diphenhydramine-acetaminophen (TYLENOL PM) 25-500 MG TABS Take 1 tablet by mouth at bedtime as needed (takes approximately once a week).    . insulin aspart (NOVOLOG) 100 UNIT/ML injection Inject 1-2 Units into the skin 3 (three) times daily as needed for high blood sugar (CBG>150). CBG < 150 = no insulin CBG 150-200 = 1 unit CBG 200-250 = 2  units    . ipratropium-albuterol (DUONEB) 0.5-2.5 (3) MG/3ML SOLN Take 3 mLs by nebulization every 6 (six) hours as needed. J44.1 code (Patient taking differently: Take 3 mLs by nebulization every 6 (six) hours as needed (shortness of breath, wheezing). J44.1 code) 360 mL 1  . levothyroxine (SYNTHROID, LEVOTHROID) 150 MCG tablet Take 150 mcg by mouth daily before breakfast.    . metoprolol tartrate (LOPRESSOR) 25 MG tablet Take 25 mg by mouth 2 (two) times daily.     . Multiple Vitamins-Minerals (MULTIVITAMIN & MINERAL PO) Take 1 tablet by mouth daily.    . nitroGLYCERIN (NITROSTAT) 0.4 MG SL tablet Place 1 tablet (0.4 mg total) under the tongue every 5 (five) minutes as needed for chest pain. 30 tablet 0  . ondansetron (ZOFRAN-ODT) 4 MG disintegrating tablet Take 4 mg by mouth every 8 (eight) hours as needed for nausea or vomiting.    . polyethylene glycol (MIRALAX / GLYCOLAX) packet Take 17 g by mouth daily as needed for mild constipation or moderate constipation.    . predniSONE (DELTASONE) 20 MG tablet Take 50 mg tablet today and taper down by 10 mg daily until completed (Patient not taking: Reported on 09/18/2014) 15 tablet 0  . tacrolimus (PROGRAF) 1 MG capsule Take 3 mg by mouth 2 (two) times daily.     . traMADol (ULTRAM) 50 MG tablet Take 1 tablet (50 mg total) by  mouth every 12 (twelve) hours as needed for moderate pain. 60 tablet 0  . verapamil (CALAN) 120 MG tablet Take 1 tablet (120 mg total) by mouth at bedtime. NEED OV. 30 tablet 1  . warfarin (COUMADIN) 4 MG tablet Take 1 tablet by mouth daily or as directed by coumadin clinic 90 tablet 0   No current facility-administered medications for this visit.    Functional Status: In your present state of health, do you have any difficulty performing the following activities: 09/18/2014 08/28/2014  Hearing? N N  Vision? Y N  Difficulty concentrating or making decisions? N N  Walking or climbing stairs? Y Y  Dressing or bathing? - N  Doing  errands, shopping? [name]  Preparing Food and eating ? N -  Using the Toilet? N -  In the past six months, have you accidently leaked urine? N -  Do you have problems with loss of bowel control? N -  Managing your Medications? N -  Managing your Finances? N -  Housekeeping or managing your Housekeeping? Y -    Fall/Depression Screening: PHQ 2/9 Scores 09/18/2014  PHQ - 2 Score 3  PHQ- 9 Score 10    Assessment: 1.  Smoking cessation:  [name] continues to be motivated to quit smoking.  She now plans to use nicotine patches and lozenges to assist with nicotine cravings, and plans to quit as soon as the nicotine replacement therapy is delivered.  Patient counseled on purpose, proper use, and potential adverse effects, including application of new nicotine patch every day and removal of patch at night if patient experiences vivid dreams or sleep disturbances, rotation of application site, and limit number of lozenges to a maximum of 20 per day.    Plan: 1.  Patient will plan to quit smoking and start using nicotine patches and lozenges when they are delivered from 1-800-QUITNOW.   2.  I will follow up with patient in one week.    THN CM Care Plan Problem One        [name] Recent Value   Care Plan Problem One  Patient is a current smoker and is intersted in quitting   Role Documenting the Problem One  [place] for Problem One  Active   THN Long Term Goal (31-90 days)  Patient will quit smoking in the next 90 days per patient report.    THN Long Term Goal Start Date  10/13/14   Interventions for Problem One Long Term Goal  Patient is waiting on delivery of nicotine patches and lozenges. She plans to quit smoking once they are delivered. Counseled patient on proper use of patches and lozenges.      [name], Pharm.D. Pharmacy Resident [place] [phone]

## 2014-11-21 DIAGNOSIS — E118 Type 2 diabetes mellitus with unspecified complications: Secondary | ICD-10-CM | POA: Diagnosis not present

## 2014-11-21 DIAGNOSIS — Z23 Encounter for immunization: Secondary | ICD-10-CM | POA: Diagnosis not present

## 2014-11-21 DIAGNOSIS — N2581 Secondary hyperparathyroidism of renal origin: Secondary | ICD-10-CM | POA: Diagnosis not present

## 2014-11-21 DIAGNOSIS — N186 End stage renal disease: Secondary | ICD-10-CM | POA: Diagnosis not present

## 2014-11-21 DIAGNOSIS — I359 Nonrheumatic aortic valve disorder, unspecified: Secondary | ICD-10-CM | POA: Diagnosis not present

## 2014-11-21 LAB — PROTIME-INR: INR: 2.6 — AB (ref 0.9–1.1)

## 2014-11-23 ENCOUNTER — Ambulatory Visit (INDEPENDENT_AMBULATORY_CARE_PROVIDER_SITE_OTHER): Payer: Medicare Other | Admitting: Pharmacist

## 2014-11-23 DIAGNOSIS — N186 End stage renal disease: Secondary | ICD-10-CM | POA: Diagnosis not present

## 2014-11-23 DIAGNOSIS — Z7901 Long term (current) use of anticoagulants: Secondary | ICD-10-CM

## 2014-11-23 DIAGNOSIS — Z952 Presence of prosthetic heart valve: Secondary | ICD-10-CM

## 2014-11-23 DIAGNOSIS — E118 Type 2 diabetes mellitus with unspecified complications: Secondary | ICD-10-CM | POA: Diagnosis not present

## 2014-11-23 DIAGNOSIS — Z954 Presence of other heart-valve replacement: Secondary | ICD-10-CM

## 2014-11-23 DIAGNOSIS — N2581 Secondary hyperparathyroidism of renal origin: Secondary | ICD-10-CM | POA: Diagnosis not present

## 2014-11-23 DIAGNOSIS — Z23 Encounter for immunization: Secondary | ICD-10-CM | POA: Diagnosis not present

## 2014-11-25 DIAGNOSIS — N186 End stage renal disease: Secondary | ICD-10-CM | POA: Diagnosis not present

## 2014-11-25 DIAGNOSIS — Z23 Encounter for immunization: Secondary | ICD-10-CM | POA: Diagnosis not present

## 2014-11-25 DIAGNOSIS — E118 Type 2 diabetes mellitus with unspecified complications: Secondary | ICD-10-CM | POA: Diagnosis not present

## 2014-11-25 DIAGNOSIS — N2581 Secondary hyperparathyroidism of renal origin: Secondary | ICD-10-CM | POA: Diagnosis not present

## 2014-11-28 DIAGNOSIS — I359 Nonrheumatic aortic valve disorder, unspecified: Secondary | ICD-10-CM | POA: Diagnosis not present

## 2014-11-28 DIAGNOSIS — Z23 Encounter for immunization: Secondary | ICD-10-CM | POA: Diagnosis not present

## 2014-11-28 DIAGNOSIS — N186 End stage renal disease: Secondary | ICD-10-CM | POA: Diagnosis not present

## 2014-11-28 DIAGNOSIS — E118 Type 2 diabetes mellitus with unspecified complications: Secondary | ICD-10-CM | POA: Diagnosis not present

## 2014-11-28 DIAGNOSIS — N2581 Secondary hyperparathyroidism of renal origin: Secondary | ICD-10-CM | POA: Diagnosis not present

## 2014-11-28 LAB — PROTIME-INR: INR: 2.9 — AB (ref 0.9–1.1)

## 2014-11-29 DIAGNOSIS — Z944 Liver transplant status: Secondary | ICD-10-CM | POA: Diagnosis not present

## 2014-11-29 DIAGNOSIS — Z5181 Encounter for therapeutic drug level monitoring: Secondary | ICD-10-CM | POA: Diagnosis not present

## 2014-11-30 ENCOUNTER — Ambulatory Visit (INDEPENDENT_AMBULATORY_CARE_PROVIDER_SITE_OTHER): Payer: Medicare Other | Admitting: Pharmacist Clinician (PhC)/ Clinical Pharmacy Specialist

## 2014-11-30 ENCOUNTER — Other Ambulatory Visit: Payer: Self-pay | Admitting: Pharmacist

## 2014-11-30 DIAGNOSIS — N2581 Secondary hyperparathyroidism of renal origin: Secondary | ICD-10-CM | POA: Diagnosis not present

## 2014-11-30 DIAGNOSIS — Z952 Presence of prosthetic heart valve: Secondary | ICD-10-CM

## 2014-11-30 DIAGNOSIS — Z7901 Long term (current) use of anticoagulants: Secondary | ICD-10-CM

## 2014-11-30 DIAGNOSIS — Z23 Encounter for immunization: Secondary | ICD-10-CM | POA: Diagnosis not present

## 2014-11-30 DIAGNOSIS — E118 Type 2 diabetes mellitus with unspecified complications: Secondary | ICD-10-CM | POA: Diagnosis not present

## 2014-11-30 DIAGNOSIS — N186 End stage renal disease: Secondary | ICD-10-CM | POA: Diagnosis not present

## 2014-11-30 DIAGNOSIS — Z954 Presence of other heart-valve replacement: Secondary | ICD-10-CM

## 2014-11-30 NOTE — Patient Outreach (Signed)
[facility]) Care Management  11/30/2014  [name] [birth year] [id number]   [name] is a 59yo who I am following for smoking cessation. I called patient today to follow up on whether she was able to get nicotine patches and lozenges through 1-800-QUITNOW.  I was unable to reach patient, and had to leave a HIPAA compliant voicemail for patient to return my call.  I will reach out on Monday, October 24th if patient does not return my call today.    Elisabeth Most, Pharm.D. Pharmacy Resident [place] [phone]

## 2014-11-30 NOTE — Patient Outreach (Signed)
[facility]) Care Management  [place]   11/30/2014  [name] 06/09/1955 [id number]  Subjective: [name] is a 59yo who I am following for smoking cessation.  I received a return phone call from the patient.  She reports she is doing great.  She said the nicotine patches from 1800QUITNOW came in the mail today.  She plans to start using the patches tomorrow morning.  Patient counseled on proper use and potential adverse effects of nicotine patches.  Discussed importance of removing all cigarettes from the home.  She said she ran out of cigarettes today and did not buy anymore.  She said the lozenges have not come in the mail yet.  Discussed alternative distraction techniques for nicotine cravings until lozenges are delivered such as deep breathing, walking, or cleaning.    Objective:   Current Medications: Current Outpatient Prescriptions  Medication Sig Dispense Refill  . albuterol (PROVENTIL,VENTOLIN) 90 MCG/ACT inhaler Inhale 1-2 puffs into the lungs every 6 (six) hours as needed for wheezing or shortness of breath.     [name] aspirin EC 81 MG tablet Take 81 mg by mouth daily.     . calcium acetate (PHOSLO) 667 MG capsule Take 667-1,334 mg by mouth 3 (three) times daily with meals. Pt reports taking 1 tab with breakfast and lunch and 2 tabs with dinner.  (but says she rarely eats breakfast or lunch)    . cinacalcet (SENSIPAR) 30 MG tablet Take 30 mg by mouth daily.    . diphenhydramine-acetaminophen (TYLENOL PM) 25-500 MG TABS Take 1 tablet by mouth at bedtime as needed (takes approximately once a week).    . insulin aspart (NOVOLOG) 100 UNIT/ML injection Inject 1-2 Units into the skin 3 (three) times daily as needed for high blood sugar (CBG>150). CBG < 150 = no insulin CBG 150-200 = 1 unit CBG 200-250 = 2 units    . ipratropium-albuterol (DUONEB) 0.5-2.5 (3) MG/3ML SOLN Take 3 mLs by nebulization every 6 (six) hours as needed. J44.1 code (Patient taking  differently: Take 3 mLs by nebulization every 6 (six) hours as needed (shortness of breath, wheezing). J44.1 code) 360 mL 1  . levothyroxine (SYNTHROID, LEVOTHROID) 150 MCG tablet Take 150 mcg by mouth daily before breakfast.    . metoprolol tartrate (LOPRESSOR) 25 MG tablet Take 25 mg by mouth 2 (two) times daily.     . Multiple Vitamins-Minerals (MULTIVITAMIN & MINERAL PO) Take 1 tablet by mouth daily.    . nitroGLYCERIN (NITROSTAT) 0.4 MG SL tablet Place 1 tablet (0.4 mg total) under the tongue every 5 (five) minutes as needed for chest pain. 30 tablet 0  . ondansetron (ZOFRAN-ODT) 4 MG disintegrating tablet Take 4 mg by mouth every 8 (eight) hours as needed for nausea or vomiting.    . polyethylene glycol (MIRALAX / GLYCOLAX) packet Take 17 g by mouth daily as needed for mild constipation or moderate constipation.    . predniSONE (DELTASONE) 20 MG tablet Take 50 mg tablet today and taper down by 10 mg daily until completed (Patient not taking: Reported on 09/18/2014) 15 tablet 0  . tacrolimus (PROGRAF) 1 MG capsule Take 3 mg by mouth 2 (two) times daily.     . traMADol (ULTRAM) 50 MG tablet Take 1 tablet (50 mg total) by mouth every 12 (twelve) hours as needed for moderate pain. 60 tablet 0  . verapamil (CALAN) 120 MG tablet Take 1 tablet (120 mg total) by mouth at bedtime. NEED OV. 30  tablet 1  . warfarin (COUMADIN) 4 MG tablet Take 1 tablet by mouth daily or as directed by coumadin clinic 90 tablet 0   No current facility-administered medications for this visit.   Functional Status: In your present state of health, do you have any difficulty performing the following activities: 09/18/2014 08/28/2014  Hearing? N N  Vision? Y N  Difficulty concentrating or making decisions? N N  Walking or climbing stairs? Y Y  Dressing or bathing? - N  Doing errands, shopping? [name]  Preparing Food and eating ? N -  Using the Toilet? N -  In the past six months, have you accidently leaked urine? N -  Do you  have problems with loss of bowel control? N -  Managing your Medications? N -  Managing your Finances? N -  Housekeeping or managing your Housekeeping? Y -   Fall/Depression Screening: PHQ 2/9 Scores 09/18/2014  PHQ - 2 Score 3  PHQ- 9 Score 10   Assessment: 1.  Smoking cessation:  [name] continues to be motivated to quit smoking.  Her nicotine patches were delivered, she has no more cigarettes in the house, and she plans to start using the patches in the morning.  Patient counseled on proper use and potential adverse effects, including application of new nicotine patch every day, removal of patch at night if patient experiences vivid dreams or sleep disturbances, and rotation of application site.    Plan: 1.  Patient will begin using nicotine patches and will quit smoking tomorrow.   2.  I will follow up with patient in two weeks.    THN CM Care Plan Problem One        Most Recent Value   Care Plan Problem One  Patient is a current smoker and is intersted in quitting   Role Documenting the Problem One  [place] for Problem One  Active   THN Long Term Goal (31-90 days)  Patient will quit smoking in the next 90 days per patient report.    THN Long Term Goal Start Date  10/13/14   Interventions for Problem One Long Term Goal  Nicotine patches were delivered today and patient plans to quit tomorrow.  Counseled patient on nicotine patches, and reinforced distraction techniques for any nicotine cravings while she is on patch.     Elisabeth Most, Pharm.D. Pharmacy Resident [place] [phone]

## 2014-12-02 DIAGNOSIS — Z23 Encounter for immunization: Secondary | ICD-10-CM | POA: Diagnosis not present

## 2014-12-02 DIAGNOSIS — N186 End stage renal disease: Secondary | ICD-10-CM | POA: Diagnosis not present

## 2014-12-02 DIAGNOSIS — E118 Type 2 diabetes mellitus with unspecified complications: Secondary | ICD-10-CM | POA: Diagnosis not present

## 2014-12-02 DIAGNOSIS — N2581 Secondary hyperparathyroidism of renal origin: Secondary | ICD-10-CM | POA: Diagnosis not present

## 2014-12-05 DIAGNOSIS — N186 End stage renal disease: Secondary | ICD-10-CM | POA: Diagnosis not present

## 2014-12-05 DIAGNOSIS — Z23 Encounter for immunization: Secondary | ICD-10-CM | POA: Diagnosis not present

## 2014-12-05 DIAGNOSIS — E118 Type 2 diabetes mellitus with unspecified complications: Secondary | ICD-10-CM | POA: Diagnosis not present

## 2014-12-05 DIAGNOSIS — I359 Nonrheumatic aortic valve disorder, unspecified: Secondary | ICD-10-CM | POA: Diagnosis not present

## 2014-12-05 DIAGNOSIS — N2581 Secondary hyperparathyroidism of renal origin: Secondary | ICD-10-CM | POA: Diagnosis not present

## 2014-12-05 LAB — PROTIME-INR: INR: 2 — AB (ref 0.9–1.1)

## 2014-12-07 DIAGNOSIS — N2581 Secondary hyperparathyroidism of renal origin: Secondary | ICD-10-CM | POA: Diagnosis not present

## 2014-12-07 DIAGNOSIS — I359 Nonrheumatic aortic valve disorder, unspecified: Secondary | ICD-10-CM | POA: Diagnosis not present

## 2014-12-07 DIAGNOSIS — N186 End stage renal disease: Secondary | ICD-10-CM | POA: Diagnosis not present

## 2014-12-07 DIAGNOSIS — Z23 Encounter for immunization: Secondary | ICD-10-CM | POA: Diagnosis not present

## 2014-12-07 DIAGNOSIS — E118 Type 2 diabetes mellitus with unspecified complications: Secondary | ICD-10-CM | POA: Diagnosis not present

## 2014-12-08 ENCOUNTER — Ambulatory Visit (INDEPENDENT_AMBULATORY_CARE_PROVIDER_SITE_OTHER): Payer: Medicare Other | Admitting: Pharmacist Clinician (PhC)/ Clinical Pharmacy Specialist

## 2014-12-08 DIAGNOSIS — Z952 Presence of prosthetic heart valve: Secondary | ICD-10-CM

## 2014-12-08 DIAGNOSIS — Z954 Presence of other heart-valve replacement: Secondary | ICD-10-CM

## 2014-12-08 DIAGNOSIS — Z7901 Long term (current) use of anticoagulants: Secondary | ICD-10-CM

## 2014-12-09 DIAGNOSIS — Z23 Encounter for immunization: Secondary | ICD-10-CM | POA: Diagnosis not present

## 2014-12-09 DIAGNOSIS — N2581 Secondary hyperparathyroidism of renal origin: Secondary | ICD-10-CM | POA: Diagnosis not present

## 2014-12-09 DIAGNOSIS — E118 Type 2 diabetes mellitus with unspecified complications: Secondary | ICD-10-CM | POA: Diagnosis not present

## 2014-12-09 DIAGNOSIS — N186 End stage renal disease: Secondary | ICD-10-CM | POA: Diagnosis not present

## 2014-12-11 DIAGNOSIS — N186 End stage renal disease: Secondary | ICD-10-CM | POA: Diagnosis not present

## 2014-12-11 DIAGNOSIS — Z992 Dependence on renal dialysis: Secondary | ICD-10-CM | POA: Diagnosis not present

## 2014-12-11 DIAGNOSIS — T8640 Unspecified complication of liver transplant: Secondary | ICD-10-CM | POA: Diagnosis not present

## 2014-12-12 DIAGNOSIS — I359 Nonrheumatic aortic valve disorder, unspecified: Secondary | ICD-10-CM | POA: Diagnosis not present

## 2014-12-12 DIAGNOSIS — N186 End stage renal disease: Secondary | ICD-10-CM | POA: Diagnosis not present

## 2014-12-12 DIAGNOSIS — D631 Anemia in chronic kidney disease: Secondary | ICD-10-CM | POA: Diagnosis not present

## 2014-12-12 DIAGNOSIS — E118 Type 2 diabetes mellitus with unspecified complications: Secondary | ICD-10-CM | POA: Diagnosis not present

## 2014-12-12 DIAGNOSIS — N2581 Secondary hyperparathyroidism of renal origin: Secondary | ICD-10-CM | POA: Diagnosis not present

## 2014-12-12 LAB — PROTIME-INR: INR: 2.7 — AB (ref 0.9–1.1)

## 2014-12-13 ENCOUNTER — Other Ambulatory Visit: Payer: Self-pay | Admitting: Pharmacist

## 2014-12-13 NOTE — Patient Outreach (Signed)
[facility]) Care Management  [place]   12/13/2014  CHASTY RANDAL 04-Dec-1955 [id number]  [name] is a 59yo who I am following for smoking cessation. I called patient to follow up on smoking cessation.  Patient reports she is doing great.  Patient reports she started using the nicotine 14mg  patches.  She reports applying a patch every morning and taking a patch off at night before bedtime.  She said she has only smoked 1-2 cigarettes since she started to use the patches.  She said she smoked when she forgot to apply the patch first thing in the morning and that caused her to have nicotine cravings.  Discussed using distraction techniques for nicotine cravings such as deep breathing, walking, or cleaning. Encouraged patient to keep the nicotine patches by her bedside to help her remember to apply the patch first thing in the morning.  Patient reports 1800QUITNOW only sent her 2 weeks of patches.  I informed patient she will need to call 1800QUIT now and go through counseling sessions again in order to get 2 additional weeks of nicotine patches.  Discussed recommended duration of nicotine patch use (6 weeks of 14mg  patches follow by 2 weeks of 7mg  patches) and patient voiced understanding.    Plan:   Patient will call 1800QUITNOW to request an additional 2 weeks of nicotine patches.  Patient will place patches by her bedside to improve compliance with applying patch first thing in the morning.  Patient will continue to use distraction techniques for nicotine cravings.  Will follow up with patient in 2 weeks.   THN CM Care Plan Problem One        Most Recent Value   Care Plan Problem One  Patient is a current smoker and is intersted in quitting   Role Documenting the Problem One  [place] for Problem One  Active   THN Long Term Goal (31-90 days)  Patient will quit smoking in the next 90 days per patient report.    THN Long Term Goal Start  Date  10/13/14   Interventions for Problem One Long Term Goal  Patient started using nicotine patches one week ago and has only smoked 1-2 cigarettes in the past week when she forgot to apply patch in the morning. Encouraged patient to keep patch by her bedside to reminder her to apply patch first thing in the morning.      Elisabeth Most, Pharm.D. Pharmacy Resident [place] [phone]

## 2014-12-15 ENCOUNTER — Ambulatory Visit (INDEPENDENT_AMBULATORY_CARE_PROVIDER_SITE_OTHER): Payer: Medicare Other | Admitting: Pharmacist Clinician (PhC)/ Clinical Pharmacy Specialist

## 2014-12-15 DIAGNOSIS — Z7901 Long term (current) use of anticoagulants: Secondary | ICD-10-CM

## 2014-12-15 DIAGNOSIS — Z952 Presence of prosthetic heart valve: Secondary | ICD-10-CM

## 2014-12-15 DIAGNOSIS — Z954 Presence of other heart-valve replacement: Secondary | ICD-10-CM

## 2014-12-19 DIAGNOSIS — I359 Nonrheumatic aortic valve disorder, unspecified: Secondary | ICD-10-CM | POA: Diagnosis not present

## 2014-12-19 LAB — PROTIME-INR: INR: 2.4 — AB (ref 0.9–1.1)

## 2014-12-21 ENCOUNTER — Ambulatory Visit (INDEPENDENT_AMBULATORY_CARE_PROVIDER_SITE_OTHER): Payer: Medicare Other | Admitting: Pharmacist Clinician (PhC)/ Clinical Pharmacy Specialist

## 2014-12-21 DIAGNOSIS — Z7901 Long term (current) use of anticoagulants: Secondary | ICD-10-CM | POA: Diagnosis not present

## 2014-12-21 DIAGNOSIS — R404 Transient alteration of awareness: Secondary | ICD-10-CM | POA: Diagnosis not present

## 2014-12-21 DIAGNOSIS — Z952 Presence of prosthetic heart valve: Secondary | ICD-10-CM

## 2014-12-21 DIAGNOSIS — R42 Dizziness and giddiness: Secondary | ICD-10-CM | POA: Diagnosis not present

## 2014-12-21 DIAGNOSIS — I517 Cardiomegaly: Secondary | ICD-10-CM | POA: Diagnosis not present

## 2014-12-21 DIAGNOSIS — F17200 Nicotine dependence, unspecified, uncomplicated: Secondary | ICD-10-CM | POA: Diagnosis not present

## 2014-12-21 DIAGNOSIS — Z954 Presence of other heart-valve replacement: Secondary | ICD-10-CM

## 2014-12-21 DIAGNOSIS — Z794 Long term (current) use of insulin: Secondary | ICD-10-CM | POA: Diagnosis not present

## 2014-12-21 DIAGNOSIS — E119 Type 2 diabetes mellitus without complications: Secondary | ICD-10-CM | POA: Diagnosis not present

## 2014-12-21 DIAGNOSIS — Z79899 Other long term (current) drug therapy: Secondary | ICD-10-CM | POA: Diagnosis not present

## 2014-12-21 DIAGNOSIS — R531 Weakness: Secondary | ICD-10-CM | POA: Diagnosis not present

## 2014-12-21 DIAGNOSIS — H9311 Tinnitus, right ear: Secondary | ICD-10-CM | POA: Diagnosis not present

## 2014-12-21 DIAGNOSIS — H6121 Impacted cerumen, right ear: Secondary | ICD-10-CM | POA: Diagnosis not present

## 2014-12-26 ENCOUNTER — Other Ambulatory Visit: Payer: Self-pay | Admitting: Pharmacist

## 2014-12-26 DIAGNOSIS — I359 Nonrheumatic aortic valve disorder, unspecified: Secondary | ICD-10-CM | POA: Diagnosis not present

## 2014-12-26 LAB — PROTIME-INR: INR: 3.1 — AB (ref 0.9–1.1)

## 2014-12-26 NOTE — Patient Outreach (Signed)
[facility]) Care Management  12/26/2014  EIKO CRETELLA Jun 03, 1955 [id number]   [name] is a 59yo who I am following for smoking cessation.  I called patient to follow up on smoking cessation.  There was no answer.  I left HIPAA compliant voicemail for patient to return my call.  I will reach out on Friday, 12/29/14 if she does not return my call.     Elisabeth Most, Pharm.D. Pharmacy Resident [place] [phone]

## 2014-12-29 ENCOUNTER — Ambulatory Visit (INDEPENDENT_AMBULATORY_CARE_PROVIDER_SITE_OTHER): Payer: Medicare Other | Admitting: Pharmacist Clinician (PhC)/ Clinical Pharmacy Specialist

## 2014-12-29 DIAGNOSIS — Z7901 Long term (current) use of anticoagulants: Secondary | ICD-10-CM

## 2014-12-29 DIAGNOSIS — Z954 Presence of other heart-valve replacement: Secondary | ICD-10-CM

## 2014-12-29 DIAGNOSIS — Z952 Presence of prosthetic heart valve: Secondary | ICD-10-CM

## 2015-01-01 ENCOUNTER — Other Ambulatory Visit: Payer: Self-pay | Admitting: Pharmacist

## 2015-01-01 NOTE — Patient Outreach (Signed)
[facility]) Care Management  01/01/2015  [name] 1955-12-26 [id number]   [name] is a 59yo who I am following for smoking cessation.  I called patient to follow up on smoking cessation.  Patient reports she continues to use the 14mg  nicotine patches.  She has received another 2 week supply of 14 mg patches.   She is now on week 4 of therapy with the 14mg  patches.  Discussed recommended duration of nicotine patch use (6 weeks of 14mg  patches followed by 2 weeks of 7mg  patches) and patient voices understanding.  Patient reports putting a new patch on every morning, and taking the patch off between 9 and 10PM.  Patient reports she has started to smoke 1 to 2 cigarettes at night after she takes the patch off.  Advised patient against smoking while using nicotine patches.  Patient reports she has not tried to keep the patch on all night.  Encouraged patient to try to wear nicotine patches for 24 hours unless sleep disturbance occurs.  Discussed use of distraction techniques for nicotine cravings.  Encouraged patient to get rid of all cigarettes in the house.    Plan:  Patient will call 1800QUITNOW to request additional nicotine patches (two weeks of 14mg  patches then 2 weeks of 7mg  patches to complete duration of therapy).  Patient will try to wear patches for 24 hours to reduce nicotine cravings at night.  Counseled patient to remove patches before bed if sleep disturbance occurs.    Patient will continue to use distraction techniques for nicotine cravings.   Will follow up with patient in one month.     THN CM Care Plan Problem One        Most Recent Value   Care Plan Problem One  Patient is a current smoker and is intersted in quitting   Role Documenting the Problem One  [place] for Problem One  Active   THN Long Term Goal (31-90 days)  Patient will quit smoking in the next 90 days per patient report.    THN Long Term Goal Start Date   10/13/14   Interventions for Problem One Long Term Goal  Patient conitnues to use nicotine patches every day.  She is tobacco free except 1 to 2 cigarettes at night after she takes off patch.  Encourgaged patient to try to wear patch for 24 hours unless sleep disturbance occurs.       Elisabeth Most, Pharm.D. Pharmacy Resident [place] [phone]

## 2015-01-02 DIAGNOSIS — I359 Nonrheumatic aortic valve disorder, unspecified: Secondary | ICD-10-CM | POA: Diagnosis not present

## 2015-01-02 LAB — PROTIME-INR: INR: 2.5 — AB (ref 0.9–1.1)

## 2015-01-03 ENCOUNTER — Ambulatory Visit (INDEPENDENT_AMBULATORY_CARE_PROVIDER_SITE_OTHER): Payer: Medicare Other | Admitting: Pharmacist Clinician (PhC)/ Clinical Pharmacy Specialist

## 2015-01-03 DIAGNOSIS — Z954 Presence of other heart-valve replacement: Secondary | ICD-10-CM

## 2015-01-03 DIAGNOSIS — Z7901 Long term (current) use of anticoagulants: Secondary | ICD-10-CM

## 2015-01-03 DIAGNOSIS — Z952 Presence of prosthetic heart valve: Secondary | ICD-10-CM

## 2015-01-03 DIAGNOSIS — I359 Nonrheumatic aortic valve disorder, unspecified: Secondary | ICD-10-CM | POA: Diagnosis not present

## 2015-01-08 DIAGNOSIS — B182 Chronic viral hepatitis C: Secondary | ICD-10-CM | POA: Diagnosis not present

## 2015-01-08 DIAGNOSIS — Z6828 Body mass index (BMI) 28.0-28.9, adult: Secondary | ICD-10-CM | POA: Diagnosis not present

## 2015-01-09 DIAGNOSIS — I359 Nonrheumatic aortic valve disorder, unspecified: Secondary | ICD-10-CM | POA: Diagnosis not present

## 2015-01-09 LAB — PROTIME-INR: INR: 2.4 — AB (ref 0.9–1.1)

## 2015-01-10 DIAGNOSIS — N186 End stage renal disease: Secondary | ICD-10-CM | POA: Diagnosis not present

## 2015-01-10 DIAGNOSIS — T8640 Unspecified complication of liver transplant: Secondary | ICD-10-CM | POA: Diagnosis not present

## 2015-01-10 DIAGNOSIS — Z992 Dependence on renal dialysis: Secondary | ICD-10-CM | POA: Diagnosis not present

## 2015-01-11 DIAGNOSIS — N2581 Secondary hyperparathyroidism of renal origin: Secondary | ICD-10-CM | POA: Diagnosis not present

## 2015-01-11 DIAGNOSIS — D631 Anemia in chronic kidney disease: Secondary | ICD-10-CM | POA: Diagnosis not present

## 2015-01-11 DIAGNOSIS — N186 End stage renal disease: Secondary | ICD-10-CM | POA: Diagnosis not present

## 2015-01-11 DIAGNOSIS — E118 Type 2 diabetes mellitus with unspecified complications: Secondary | ICD-10-CM | POA: Diagnosis not present

## 2015-01-12 ENCOUNTER — Ambulatory Visit (INDEPENDENT_AMBULATORY_CARE_PROVIDER_SITE_OTHER): Payer: Medicare Other | Admitting: Pharmacist Clinician (PhC)/ Clinical Pharmacy Specialist

## 2015-01-12 DIAGNOSIS — Z5181 Encounter for therapeutic drug level monitoring: Secondary | ICD-10-CM | POA: Diagnosis not present

## 2015-01-12 DIAGNOSIS — Z952 Presence of prosthetic heart valve: Secondary | ICD-10-CM

## 2015-01-12 DIAGNOSIS — Z944 Liver transplant status: Secondary | ICD-10-CM | POA: Diagnosis not present

## 2015-01-12 DIAGNOSIS — Z7901 Long term (current) use of anticoagulants: Secondary | ICD-10-CM

## 2015-01-12 DIAGNOSIS — B182 Chronic viral hepatitis C: Secondary | ICD-10-CM | POA: Diagnosis not present

## 2015-01-12 DIAGNOSIS — Z954 Presence of other heart-valve replacement: Secondary | ICD-10-CM

## 2015-01-13 DIAGNOSIS — D631 Anemia in chronic kidney disease: Secondary | ICD-10-CM | POA: Diagnosis not present

## 2015-01-13 DIAGNOSIS — E118 Type 2 diabetes mellitus with unspecified complications: Secondary | ICD-10-CM | POA: Diagnosis not present

## 2015-01-13 DIAGNOSIS — N2581 Secondary hyperparathyroidism of renal origin: Secondary | ICD-10-CM | POA: Diagnosis not present

## 2015-01-13 DIAGNOSIS — N186 End stage renal disease: Secondary | ICD-10-CM | POA: Diagnosis not present

## 2015-01-15 DIAGNOSIS — B259 Cytomegaloviral disease, unspecified: Secondary | ICD-10-CM | POA: Diagnosis not present

## 2015-01-15 DIAGNOSIS — K769 Liver disease, unspecified: Secondary | ICD-10-CM | POA: Diagnosis not present

## 2015-01-15 DIAGNOSIS — E785 Hyperlipidemia, unspecified: Secondary | ICD-10-CM | POA: Diagnosis not present

## 2015-01-15 DIAGNOSIS — Z79899 Other long term (current) drug therapy: Secondary | ICD-10-CM | POA: Diagnosis not present

## 2015-01-15 DIAGNOSIS — Z1159 Encounter for screening for other viral diseases: Secondary | ICD-10-CM | POA: Diagnosis not present

## 2015-01-15 DIAGNOSIS — Z944 Liver transplant status: Secondary | ICD-10-CM | POA: Diagnosis not present

## 2015-01-15 DIAGNOSIS — B1910 Unspecified viral hepatitis B without hepatic coma: Secondary | ICD-10-CM | POA: Diagnosis not present

## 2015-01-16 DIAGNOSIS — E118 Type 2 diabetes mellitus with unspecified complications: Secondary | ICD-10-CM | POA: Diagnosis not present

## 2015-01-16 DIAGNOSIS — N2581 Secondary hyperparathyroidism of renal origin: Secondary | ICD-10-CM | POA: Diagnosis not present

## 2015-01-16 DIAGNOSIS — D631 Anemia in chronic kidney disease: Secondary | ICD-10-CM | POA: Diagnosis not present

## 2015-01-16 DIAGNOSIS — N186 End stage renal disease: Secondary | ICD-10-CM | POA: Diagnosis not present

## 2015-01-18 DIAGNOSIS — N2581 Secondary hyperparathyroidism of renal origin: Secondary | ICD-10-CM | POA: Diagnosis not present

## 2015-01-18 DIAGNOSIS — N186 End stage renal disease: Secondary | ICD-10-CM | POA: Diagnosis not present

## 2015-01-18 DIAGNOSIS — D631 Anemia in chronic kidney disease: Secondary | ICD-10-CM | POA: Diagnosis not present

## 2015-01-18 DIAGNOSIS — E118 Type 2 diabetes mellitus with unspecified complications: Secondary | ICD-10-CM | POA: Diagnosis not present

## 2015-01-20 DIAGNOSIS — E118 Type 2 diabetes mellitus with unspecified complications: Secondary | ICD-10-CM | POA: Diagnosis not present

## 2015-01-20 DIAGNOSIS — N186 End stage renal disease: Secondary | ICD-10-CM | POA: Diagnosis not present

## 2015-01-20 DIAGNOSIS — N2581 Secondary hyperparathyroidism of renal origin: Secondary | ICD-10-CM | POA: Diagnosis not present

## 2015-01-20 DIAGNOSIS — D631 Anemia in chronic kidney disease: Secondary | ICD-10-CM | POA: Diagnosis not present

## 2015-01-23 ENCOUNTER — Emergency Department (HOSPITAL_COMMUNITY): Payer: Medicare Other

## 2015-01-23 ENCOUNTER — Encounter (HOSPITAL_COMMUNITY): Payer: Self-pay

## 2015-01-23 ENCOUNTER — Emergency Department (HOSPITAL_COMMUNITY)
Admission: EM | Admit: 2015-01-23 | Discharge: 2015-01-23 | Disposition: A | Discharge status: Home / Self Care | Payer: Medicare Other | Attending: Emergency Medicine | Admitting: Emergency Medicine

## 2015-01-23 DIAGNOSIS — Z8619 Personal history of other infectious and parasitic diseases: Secondary | ICD-10-CM | POA: Diagnosis not present

## 2015-01-23 DIAGNOSIS — R0682 Tachypnea, not elsewhere classified: Secondary | ICD-10-CM | POA: Diagnosis not present

## 2015-01-23 DIAGNOSIS — Z954 Presence of other heart-valve replacement: Secondary | ICD-10-CM | POA: Diagnosis not present

## 2015-01-23 DIAGNOSIS — E669 Obesity, unspecified: Secondary | ICD-10-CM | POA: Insufficient documentation

## 2015-01-23 DIAGNOSIS — G8929 Other chronic pain: Secondary | ICD-10-CM | POA: Diagnosis not present

## 2015-01-23 DIAGNOSIS — M159 Polyosteoarthritis, unspecified: Secondary | ICD-10-CM | POA: Diagnosis not present

## 2015-01-23 DIAGNOSIS — K219 Gastro-esophageal reflux disease without esophagitis: Secondary | ICD-10-CM | POA: Insufficient documentation

## 2015-01-23 DIAGNOSIS — F329 Major depressive disorder, single episode, unspecified: Secondary | ICD-10-CM | POA: Diagnosis not present

## 2015-01-23 DIAGNOSIS — G43909 Migraine, unspecified, not intractable, without status migrainosus: Secondary | ICD-10-CM | POA: Insufficient documentation

## 2015-01-23 DIAGNOSIS — E119 Type 2 diabetes mellitus without complications: Secondary | ICD-10-CM | POA: Diagnosis not present

## 2015-01-23 DIAGNOSIS — Z992 Dependence on renal dialysis: Secondary | ICD-10-CM | POA: Diagnosis not present

## 2015-01-23 DIAGNOSIS — F17210 Nicotine dependence, cigarettes, uncomplicated: Secondary | ICD-10-CM | POA: Insufficient documentation

## 2015-01-23 DIAGNOSIS — Z7982 Long term (current) use of aspirin: Secondary | ICD-10-CM | POA: Diagnosis not present

## 2015-01-23 DIAGNOSIS — K5900 Constipation, unspecified: Secondary | ICD-10-CM | POA: Diagnosis not present

## 2015-01-23 DIAGNOSIS — I120 Hypertensive chronic kidney disease with stage 5 chronic kidney disease or end stage renal disease: Secondary | ICD-10-CM | POA: Insufficient documentation

## 2015-01-23 DIAGNOSIS — Z951 Presence of aortocoronary bypass graft: Secondary | ICD-10-CM | POA: Diagnosis not present

## 2015-01-23 DIAGNOSIS — Z7901 Long term (current) use of anticoagulants: Secondary | ICD-10-CM | POA: Insufficient documentation

## 2015-01-23 DIAGNOSIS — Z8701 Personal history of pneumonia (recurrent): Secondary | ICD-10-CM | POA: Diagnosis not present

## 2015-01-23 DIAGNOSIS — Z944 Liver transplant status: Secondary | ICD-10-CM | POA: Insufficient documentation

## 2015-01-23 DIAGNOSIS — R059 Cough, unspecified: Secondary | ICD-10-CM

## 2015-01-23 DIAGNOSIS — R05 Cough: Secondary | ICD-10-CM

## 2015-01-23 DIAGNOSIS — Z8669 Personal history of other diseases of the nervous system and sense organs: Secondary | ICD-10-CM | POA: Insufficient documentation

## 2015-01-23 DIAGNOSIS — J441 Chronic obstructive pulmonary disease with (acute) exacerbation: Secondary | ICD-10-CM | POA: Insufficient documentation

## 2015-01-23 DIAGNOSIS — N186 End stage renal disease: Secondary | ICD-10-CM | POA: Diagnosis not present

## 2015-01-23 DIAGNOSIS — Z9889 Other specified postprocedural states: Secondary | ICD-10-CM | POA: Diagnosis not present

## 2015-01-23 DIAGNOSIS — D649 Anemia, unspecified: Secondary | ICD-10-CM | POA: Insufficient documentation

## 2015-01-23 DIAGNOSIS — F419 Anxiety disorder, unspecified: Secondary | ICD-10-CM | POA: Insufficient documentation

## 2015-01-23 DIAGNOSIS — E118 Type 2 diabetes mellitus with unspecified complications: Secondary | ICD-10-CM | POA: Diagnosis not present

## 2015-01-23 DIAGNOSIS — I2510 Atherosclerotic heart disease of native coronary artery without angina pectoris: Secondary | ICD-10-CM | POA: Insufficient documentation

## 2015-01-23 DIAGNOSIS — I739 Peripheral vascular disease, unspecified: Secondary | ICD-10-CM | POA: Diagnosis not present

## 2015-01-23 DIAGNOSIS — R0602 Shortness of breath: Secondary | ICD-10-CM | POA: Diagnosis not present

## 2015-01-23 DIAGNOSIS — Z794 Long term (current) use of insulin: Secondary | ICD-10-CM | POA: Diagnosis not present

## 2015-01-23 DIAGNOSIS — E039 Hypothyroidism, unspecified: Secondary | ICD-10-CM | POA: Diagnosis not present

## 2015-01-23 DIAGNOSIS — D631 Anemia in chronic kidney disease: Secondary | ICD-10-CM | POA: Diagnosis not present

## 2015-01-23 DIAGNOSIS — R0600 Dyspnea, unspecified: Secondary | ICD-10-CM

## 2015-01-23 DIAGNOSIS — N2581 Secondary hyperparathyroidism of renal origin: Secondary | ICD-10-CM | POA: Diagnosis not present

## 2015-01-23 LAB — COMPREHENSIVE METABOLIC PANEL
ALBUMIN: 3.3 g/dL — AB (ref 3.5–5.0)
ALT: 8 U/L — ABNORMAL LOW (ref 14–54)
AST: 14 U/L — AB (ref 15–41)
Alkaline Phosphatase: 75 U/L (ref 38–126)
Anion gap: 14 (ref 5–15)
BILIRUBIN TOTAL: 1.2 mg/dL (ref 0.3–1.2)
BUN: 30 mg/dL — AB (ref 6–20)
CHLORIDE: 91 mmol/L — AB (ref 101–111)
CO2: 30 mmol/L (ref 22–32)
Calcium: 8.6 mg/dL — ABNORMAL LOW (ref 8.9–10.3)
Creatinine, Ser: 10.05 mg/dL — ABNORMAL HIGH (ref 0.44–1.00)
GFR calc Af Amer: 4 mL/min — ABNORMAL LOW (ref >60)
GFR calc non Af Amer: 4 mL/min — ABNORMAL LOW (ref >60)
GLUCOSE: 164 mg/dL — AB (ref 65–99)
POTASSIUM: 3.7 mmol/L (ref 3.5–5.1)
SODIUM: 135 mmol/L (ref 135–145)
Total Protein: 7.3 g/dL (ref 6.5–8.1)

## 2015-01-23 LAB — PROTIME-INR
INR: 2.32 — AB (ref 0.00–1.49)
PROTHROMBIN TIME: 25.2 s — AB (ref 11.6–15.2)

## 2015-01-23 LAB — I-STAT VENOUS BLOOD GAS, ED
ACID-BASE EXCESS: 7 mmol/L — AB (ref 0.0–2.0)
BICARBONATE: 29.6 meq/L — AB (ref 20.0–24.0)
O2 SAT: 49 %
PH VEN: 7.523 — AB (ref 7.250–7.300)
TCO2: 31 mmol/L (ref 0–100)
pCO2, Ven: 36 mmHg — ABNORMAL LOW (ref 45.0–50.0)
pO2, Ven: 23 mmHg — CL (ref 30.0–45.0)

## 2015-01-23 LAB — CBC WITH DIFFERENTIAL/PLATELET
Basophils Absolute: 0 10*3/uL (ref 0.0–0.1)
Basophils Relative: 0 %
EOS PCT: 1 %
Eosinophils Absolute: 0.1 10*3/uL (ref 0.0–0.7)
HEMATOCRIT: 31.6 % — AB (ref 36.0–46.0)
Hemoglobin: 10.3 g/dL — ABNORMAL LOW (ref 12.0–15.0)
LYMPHS ABS: 1.2 10*3/uL (ref 0.7–4.0)
LYMPHS PCT: 22 %
MCH: 31.4 pg (ref 26.0–34.0)
MCHC: 32.6 g/dL (ref 30.0–36.0)
MCV: 96.3 fL (ref 78.0–100.0)
MONO ABS: 0.5 10*3/uL (ref 0.1–1.0)
MONOS PCT: 9 %
NEUTROS ABS: 3.8 10*3/uL (ref 1.7–7.7)
Neutrophils Relative %: 68 %
PLATELETS: 131 10*3/uL — AB (ref 150–400)
RBC: 3.28 MIL/uL — ABNORMAL LOW (ref 3.87–5.11)
RDW: 14.4 % (ref 11.5–15.5)
WBC: 5.6 10*3/uL (ref 4.0–10.5)

## 2015-01-23 LAB — I-STAT TROPONIN, ED: Troponin i, poc: 0.04 ng/mL (ref 0.00–0.08)

## 2015-01-23 MED ORDER — AZITHROMYCIN 250 MG PO TABS: 250.0000 mg | ORAL_TABLET | Freq: Every day | ORAL | Status: DC

## 2015-01-23 NOTE — Discharge Instructions (Signed)
Go to dialysis today.  Your INR was 2.3 today.  The antibiotic may effect the level of you coumadin - get rechecked by your doctor in the next two days.  Get rechecked immediately if you have new or worrisome symptoms.    Cough, Adult Coughing is a reflex that clears your throat and your airways. Coughing helps to heal and protect your lungs. It is normal to cough occasionally, but a cough that happens with other symptoms or lasts a long time may be a sign of a condition that needs treatment. A cough may last only 2-3 weeks (acute), or it may last longer than 8 weeks (chronic). CAUSES Coughing is commonly caused by:  Breathing in substances that irritate your lungs.  A viral or bacterial respiratory infection.  Allergies.  Asthma.  Postnasal drip.  Smoking.  Acid backing up from the stomach into the esophagus (gastroesophageal reflux).  Certain medicines.  Chronic lung problems, including COPD (or rarely, lung cancer).  Other medical conditions such as heart failure. HOME CARE INSTRUCTIONS  Pay attention to any changes in your symptoms. Take these actions to help with your discomfort:  Take medicines only as told by your health care provider.  If you were prescribed an antibiotic medicine, take it as told by your health care provider. Do not stop taking the antibiotic even if you start to feel better.  Talk with your health care provider before you take a cough suppressant medicine.  Drink enough fluid to keep your urine clear or pale yellow.  If the air is dry, use a cold steam vaporizer or humidifier in your bedroom or your home to help loosen secretions.  Avoid anything that causes you to cough at work or at home.  If your cough is worse at night, try sleeping in a semi-upright position.  Avoid cigarette smoke. If you smoke, quit smoking. If you need help quitting, ask your health care provider.  Avoid caffeine.  Avoid alcohol.  Rest as needed. SEEK MEDICAL CARE  IF:   You have new symptoms.  You cough up pus.  Your cough does not get better after 2-3 weeks, or your cough gets worse.  You cannot control your cough with suppressant medicines and you are losing sleep.  You develop pain that is getting worse or pain that is not controlled with pain medicines.  You have a fever.  You have unexplained weight loss.  You have night sweats. SEEK IMMEDIATE MEDICAL CARE IF:  You cough up blood.  You have difficulty breathing.  Your heartbeat is very fast.   This information is not intended to replace advice given to you by your health care provider. Make sure you discuss any questions you have with your health care provider.   Document Released: 07/26/2010 Document Revised: 10/18/2014 Document Reviewed: 04/05/2014 Elsevier Interactive Patient Education [employer].

## 2015-01-23 NOTE — ED Provider Notes (Signed)
CSN: [id number]     Arrival date & time 01/23/15  0712 History   First MD Initiated Contact with Patient 01/23/15 0715     Chief Complaint  Patient presents with  . Shortness of Breath     Patient is a 59 y.o. female presenting with shortness of breath. The history is provided by the patient. No language interpreter was used.  Shortness of Breath  [name] is a 59 y.o. female who presents to the Emergency Department complaining of SOB she has a history of liver transplant, end-stage renal disease, COPD. Yesterday she developed increased shortness of breath with cough productive of phlegm. She reports chest pain with coughing. No fevers, abdominal pain, vomiting, diarrhea. She recently completed treatment for hepatitis C, last pill was yesterday. She tried her inhaler twice at home without any significant relief. She feels mildly improved today in the emergency department. She receives dialysis Saturday, Tuesday, Thursday. She is due for dialysis this morning.Sxs are moderate, constant, worsening.    Past Medical History  Diagnosis Date  . 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 Chronic back pain   . CAD (coronary artery disease)   . Obesity   . Peripheral vascular disease (HCC) hands and legs  . Anxiety   . Asthma   . GERD (gastroesophageal reflux disease)     takes Omeprazole daily  . Chronic constipation     takes MIralax and Colace daily  . Anemia     takes Folic Acid daily  . Hypothyroidism     takes Synthroid daily  . Depression     takes Cymbalta for "severe" depression  . Neuromuscular disorder ([place])     carpal tunnel in right hand  . Hypertension     takes Metoprolol and Lisinopril daily, sees Dr Bea Graff  . COPD (chronic obstructive pulmonary disease) ([place])   . Pneumonia     "today and several times before" (08/30/2012)  . Chronic bronchitis ([place])     "q yr w/season changes"  (08/30/2012)  . Type II diabetes mellitus (HCC)     Levemir 2units daily if > 150  . History of blood transfusion     "several" (08/30/2012)  . Hepatitis C   . Migraine     "last migraine was in 2013" (08/30/2012)  . Headache     "at least monthly" (08/30/2012)  . Arthritis     "left hand, back" (08/30/2012)  . End stage renal disease on dialysis [facility]) 02/27/2011    Kidneys shut down at time of liver transplant in Sept 2011 at [facility] in [place], she has been on HD ever since.  Dialyzes at [facility] HD on TTS schedule.  Had L forearm graft used 10 months then removed Dec 2012 due to suspected infection.  A right upper arm AVG was placed Dec 2012 but she developed steal symptoms acutely and it was ligated the same day.  Never had an AV fistula due to small veins.  Now has L thigh AVG put in Jan 2013, has not clotted to date.    [name] CAD (coronary artery disease) Jan. 2015    Cath: 20% LAD, 50% D1; s/p LIMA-LAD  . Aortic stenosis   . Tobacco abuse   . S/P aortic valve replacement 03/15/13    Mechanical   . SVT (supraventricular tachycardia) ([place]) 06/09/14   Past Surgical History  Procedure  Laterality Date  . Liver transplant  10/25/2009    sees Dr Ferol Luz 1 every 6 months, saw last in Dec 2013. [name] Coord [phone]  . Small intestine surgery  90's  . Thrombectomy    . Arteriovenous graft placement Left 10/03/10     forearm  . Avgg removal  12/23/2010    Procedure: REMOVAL OF ARTERIOVENOUS GORETEX GRAFT ([place]);  Surgeon: Elam Dutch, MD;  Location: [place];  Service: Vascular;  Laterality: Left;  procedure started @1736 -1852  . Insertion of dialysis catheter  12/23/2010    Procedure: INSERTION OF DIALYSIS CATHETER;  Surgeon: Elam Dutch, MD;  Location: [place];  Service: Vascular;  Laterality: Right;  Right Internal Jugular 28cm dialysis catheter insertion procedure time 1701-1720   . Cholecystectomy  1993  . Cystoscopy  1990's  . Spinal growth rods  2010    "put 2 metal rods  in my back; they had detetriorated" (08/30/2012)  . Av fistula placement  01/29/2011    Procedure: INSERTION OF ARTERIOVENOUS (AV) GORE-TEX GRAFT ARM;  Surgeon: Elam Dutch, MD;  Location: [place];  Service: Vascular;  Laterality: Right;  . Av fistula placement  03/10/2011    Procedure: INSERTION OF ARTERIOVENOUS (AV) GORE-TEX GRAFT THIGH;  Surgeon: Elam Dutch, MD;  Location: [place];  Service: Vascular;  Laterality: Left;  . Tubal ligation  1990's  . Aortic valve replacement N/A 03/15/2013    AVR; Surgeon: Ivin Poot, MD;  Location: [facility] OR; Open Heart Surgery;  28mm[employer] mechanical prosthesis, top hat valve  . Coronary artery bypass graft N/A 03/15/2013    Procedure: CORONARY ARTERY BYPASS GRAFTING (CABG) times one using left internal mammary artery.;  Surgeon: Ivin Poot, MD;  Location: [place];  Service: Open Heart Surgery;  Laterality: N/A;  POSS CABG X 1  . Intraoperative transesophageal echocardiogram N/A 03/15/2013    Procedure: INTRAOPERATIVE TRANSESOPHAGEAL ECHOCARDIOGRAM;  Surgeon: Ivin Poot, MD;  Location: [place];  Service: Open Heart Surgery;  Laterality: N/A;  . Left heart catheterization with coronary/graft angiogram N/A 12/24/2011    Procedure: LEFT HEART CATHETERIZATION WITH Beatrix Fetters;  Surgeon: [name], MD;  Location: [facility] CATH LAB;  Service: Cardiovascular;  Laterality: N/A;  . Left heart catheterization with coronary angiogram N/A 07/29/2012    Procedure: LEFT HEART CATHETERIZATION WITH CORONARY ANGIOGRAM;  Surgeon: Troy Sine, MD;  Location: [facility] CATH LAB;  Service: Cardiovascular;  Laterality: N/A;  . Left heart catheterization with coronary angiogram N/A 03/10/2013    Procedure: LEFT HEART CATHETERIZATION WITH CORONARY ANGIOGRAM;  Surgeon: Troy Sine, MD;  Location: [facility] CATH LAB;  Service: Cardiovascular;  Laterality: N/A;  . Left heart catheterization with coronary/graft angiogram N/A 12/16/2013    Procedure: LEFT HEART  CATHETERIZATION WITH Beatrix Fetters;  Surgeon: Troy Sine, MD;  Location: [facility] CATH LAB;  Service: Cardiovascular;  Laterality: N/A;  . Thrombectomy and revision of arterioventous (av) goretex  graft Left 03/30/2014  . Thrombectomy and revision of arterioventous (av) goretex  graft Left 03/30/2014    Procedure: THROMBECTOMY AND REVISION OF ARTERIOVENTOUS (AV) GORETEX  GRAFT;  Surgeon: Conrad [place], MD;  Location: [place];  Service: Vascular;  Laterality: Left;  . Shuntogram Left 05/15/2014    Procedure: SHUNTOGRAM;  Surgeon: Conrad Geiger, MD;  Location: [facility] CATH LAB;  Service: Cardiovascular;  Laterality: Left;   Family History  Problem Relation Age of Onset  . Cancer Mother   . Diabetes Mother   . Hypertension  Mother   . Stroke Mother   . Cancer Father   . Anesthesia problems Neg Hx   . Hypotension Neg Hx   . Malignant hyperthermia Neg Hx   . Pseudochol deficiency Neg Hx    Social History  Substance Use Topics  . Smoking status: Current Some Day Smoker -- 0.25 packs/day for 40 years    Types: Cigarettes  . Smokeless tobacco: Never Used     Comment: still smokng 1 or 2 a day  . Alcohol Use: No   OB History    No data available     Review of Systems  Respiratory: Positive for shortness of breath.   All other systems reviewed and are negative.     Allergies  Acetaminophen; Codeine; Mirtazapine; and Morphine  Home Medications   Prior to Admission medications   Medication Sig Start Date End Date Taking? Authorizing Provider  albuterol (PROVENTIL,VENTOLIN) 90 MCG/ACT inhaler Inhale 1-2 puffs into the lungs every 6 (six) hours as needed for wheezing or shortness of breath.     Historical Provider, MD  aspirin EC 81 MG tablet Take 81 mg by mouth daily.     Historical Provider, MD  calcium acetate (PHOSLO) 667 MG capsule Take 667-1,334 mg by mouth 3 (three) times daily with meals. Pt reports taking 1 tab with breakfast and lunch and 2 tabs with dinner.  (but says she  rarely eats breakfast or lunch)    Historical Provider, MD  cinacalcet (SENSIPAR) 30 MG tablet Take 30 mg by mouth daily.    Historical Provider, MD  diphenhydramine-acetaminophen (TYLENOL PM) 25-500 MG TABS Take 1 tablet by mouth at bedtime as needed (takes approximately once a week).    Historical Provider, MD  Elbasvir-Grazoprevir (ZEPATIER) 50-100 MG TABS Take 1 tablet by mouth daily.    Historical Provider, MD  insulin aspart (NOVOLOG) 100 UNIT/ML injection Inject 1-2 Units into the skin 3 (three) times daily as needed for high blood sugar (CBG>150). CBG < 150 = no insulin CBG 150-200 = 1 unit CBG 200-250 = 2 units    Historical Provider, MD  ipratropium-albuterol (DUONEB) 0.5-2.5 (3) MG/3ML SOLN Take 3 mLs by nebulization every 6 (six) hours as needed. J44.1 code Patient taking differently: Take 3 mLs by nebulization every 6 (six) hours as needed (shortness of breath, wheezing). J44.1 code 08/31/14   Theodis Blaze, MD  levothyroxine (SYNTHROID, LEVOTHROID) 150 MCG tablet Take 150 mcg by mouth daily before breakfast.    Historical Provider, MD  metoprolol tartrate (LOPRESSOR) 25 MG tablet Take 25 mg by mouth 2 (two) times daily.  03/16/14   Historical Provider, MD  Multiple Vitamins-Minerals (MULTIVITAMIN & MINERAL PO) Take 1 tablet by mouth daily.    Historical Provider, MD  nitroGLYCERIN (NITROSTAT) 0.4 MG SL tablet Place 1 tablet (0.4 mg total) under the tongue every 5 (five) minutes as needed for chest pain. 03/20/14   Silver Huguenin Elgergawy, MD  ondansetron (ZOFRAN-ODT) 4 MG disintegrating tablet Take 4 mg by mouth every 8 (eight) hours as needed for nausea or vomiting.    Historical Provider, MD  polyethylene glycol (MIRALAX / GLYCOLAX) packet Take 17 g by mouth daily as needed for mild constipation or moderate constipation.    Historical Provider, MD  predniSONE (DELTASONE) 20 MG tablet Take 50 mg tablet today and taper down by 10 mg daily until completed Patient not taking: Reported on 09/18/2014  08/30/14   Theodis Blaze, MD  tacrolimus (PROGRAF) 1 MG capsule Take 2 mg  by mouth 2 (two) times daily.     Historical Provider, MD  traMADol (ULTRAM) 50 MG tablet Take 1 tablet (50 mg total) by mouth every 12 (twelve) hours as needed for moderate pain. 08/30/14   Theodis Blaze, MD  verapamil (CALAN) 120 MG tablet Take 1 tablet (120 mg total) by mouth at bedtime. NEED OV. 10/02/14   Troy Sine, MD  warfarin (COUMADIN) 4 MG tablet Take 1 tablet by mouth daily or as directed by coumadin clinic 10/04/14   Troy Sine, MD   BP 175/62 mmHg  Pulse 71  Temp(Src) 98 F (36.7 C) (Oral)  Ht 5\' 4"  (1.626 m)  Wt 169 lb (76.658 kg)  BMI 28.99 kg/m2  SpO2 100% Physical Exam  Constitutional: She is oriented to person, place, and time. She appears well-developed and well-nourished.  HENT:  Head: Normocephalic and atraumatic.  Neck: Neck supple. No JVD present.  Cardiovascular: Normal rate and regular rhythm.   No murmur heard. Pulmonary/Chest: Breath sounds normal. No respiratory distress.  tachypnea  Abdominal: Soft. There is no tenderness. There is no rebound and no guarding.  Musculoskeletal: She exhibits no edema or tenderness.  Neurological: She is alert and oriented to person, place, and time.  Skin: Skin is warm and dry.  Psychiatric: She has a normal mood and affect. Her behavior is normal.  Nursing note and vitals reviewed.   ED Course  Procedures (including critical care time) Labs Review Labs Reviewed  COMPREHENSIVE METABOLIC PANEL - Abnormal; Notable for the following:    Chloride 91 (*)    Glucose, Bld 164 (*)    BUN 30 (*)    Creatinine, Ser 10.05 (*)    Calcium 8.6 (*)    Albumin 3.3 (*)    AST 14 (*)    ALT 8 (*)    GFR calc non Af Amer 4 (*)    GFR calc Af Amer 4 (*)    All other components within normal limits  CBC WITH DIFFERENTIAL/PLATELET - Abnormal; Notable for the following:    RBC 3.28 (*)    Hemoglobin 10.3 (*)    HCT 31.6 (*)    Platelets 131 (*)     All other components within normal limits  PROTIME-INR - Abnormal; Notable for the following:    Prothrombin Time 25.2 (*)    INR 2.32 (*)    All other components within normal limits  I-STAT VENOUS BLOOD GAS, ED - Abnormal; Notable for the following:    pH, Ven 7.523 (*)    pCO2, Ven 36.0 (*)    pO2, Ven 23.0 (*)    Bicarbonate 29.6 (*)    Acid-Base Excess 7.0 (*)    All other components within normal limits  I-STAT TROPOININ, ED    Imaging Review Dg Chest 2 View  01/23/2015  CLINICAL DATA:  Shortness of breath, cough EXAM: CHEST  2 VIEW COMPARISON:  Its 12/21/2014 FINDINGS: Cardiomediastinal silhouette is stable. Status post median sternotomy and aortic valve replacement. No pulmonary edema. There is trace left pleural effusion and left basilar atelectasis. Small right pleural effusion with right basilar atelectasis or infiltrate. IMPRESSION: No pulmonary edema. Trace left pleural effusion with left basilar atelectasis. Small right pleural effusion with right basilar atelectasis or infiltrate. Electronically Signed   By: Lahoma Crocker M.D.   On: 01/23/2015 08:13   I have personally reviewed and evaluated these images and lab results as part of my medical decision-making.   EKG Interpretation   Date/Time:  Tuesday January 23 2015 07:14:33 EST Ventricular Rate:  70 PR Interval:  189 QRS Duration: 92 QT Interval:  463 QTC Calculation: 500 R Axis:   46 Text Interpretation:  Sinus rhythm Anteroseptal infarct, old Abnormal T   lateral leads Confirmed by Hazle Coca [phone]) on 01/23/2015 8:03:16 AM      MDM   Final diagnoses:  Dyspnea  Cough    Pt here with dyspnea, dry cough, multiple medical comorbidities.  She is in no acute distress in the ED, lungs are clear on exam.  Presentation not c/w acute CHF, pulmonary edema, PNA, PE, ACS, hypertensive urgency, COPD exacerbation.  Pt is due for dialysis today - plan to d/c to dialysis center.  CXR with atelectasis vs infiltrate -  treating with azithromycin given pt's immunosuppression.  Discussed very close return precautions for progressive or new sxs.     Quintella Reichert, MD 01/23/15 [phone]

## 2015-01-23 NOTE — ED Notes (Signed)
Pt. Coming from home via [facility] c/o shortness of breath since 1am. Pt. Got up at 1am and took a breathing tx with no relief. Pt. Did another breathing tx at 4am without relief and called EMS. Pt. Extensive health hx. On EMS arrival pt. Hyperventilating at 30-35x a minute, but O2 sat. Remains at 100%. Pt. Also reports non-productive cough and runny nose starting yesterday.

## 2015-01-23 NOTE — ED Notes (Signed)
Ambulated pt in hallway. While ambulating pt, pt's O2 started at 100%. The lowest it got was 96% and picked back up to 100%. Pt stated she feels like she has ran a marathon, and also feels very tired. Pt is back in bed P: 76 BP:179/56 O2: 99 (roomair) R: 17

## 2015-01-25 DIAGNOSIS — D631 Anemia in chronic kidney disease: Secondary | ICD-10-CM | POA: Diagnosis not present

## 2015-01-25 DIAGNOSIS — N2581 Secondary hyperparathyroidism of renal origin: Secondary | ICD-10-CM | POA: Diagnosis not present

## 2015-01-25 DIAGNOSIS — E118 Type 2 diabetes mellitus with unspecified complications: Secondary | ICD-10-CM | POA: Diagnosis not present

## 2015-01-25 DIAGNOSIS — N186 End stage renal disease: Secondary | ICD-10-CM | POA: Diagnosis not present

## 2015-01-25 DIAGNOSIS — I359 Nonrheumatic aortic valve disorder, unspecified: Secondary | ICD-10-CM | POA: Diagnosis not present

## 2015-01-25 LAB — PROTIME-INR: INR: 2.4 — AB (ref 0.9–1.1)

## 2015-01-26 ENCOUNTER — Ambulatory Visit (INDEPENDENT_AMBULATORY_CARE_PROVIDER_SITE_OTHER): Payer: Medicare Other | Admitting: Pharmacist Clinician (PhC)/ Clinical Pharmacy Specialist

## 2015-01-26 DIAGNOSIS — Z7901 Long term (current) use of anticoagulants: Secondary | ICD-10-CM

## 2015-01-26 DIAGNOSIS — Z952 Presence of prosthetic heart valve: Secondary | ICD-10-CM

## 2015-01-26 DIAGNOSIS — Z954 Presence of other heart-valve replacement: Secondary | ICD-10-CM

## 2015-01-27 DIAGNOSIS — E118 Type 2 diabetes mellitus with unspecified complications: Secondary | ICD-10-CM | POA: Diagnosis not present

## 2015-01-27 DIAGNOSIS — D631 Anemia in chronic kidney disease: Secondary | ICD-10-CM | POA: Diagnosis not present

## 2015-01-27 DIAGNOSIS — N2581 Secondary hyperparathyroidism of renal origin: Secondary | ICD-10-CM | POA: Diagnosis not present

## 2015-01-27 DIAGNOSIS — N186 End stage renal disease: Secondary | ICD-10-CM | POA: Diagnosis not present

## 2015-01-29 ENCOUNTER — Other Ambulatory Visit: Payer: Self-pay | Admitting: Pharmacist

## 2015-01-29 DIAGNOSIS — E039 Hypothyroidism, unspecified: Secondary | ICD-10-CM | POA: Diagnosis not present

## 2015-01-29 DIAGNOSIS — J40 Bronchitis, not specified as acute or chronic: Secondary | ICD-10-CM | POA: Diagnosis not present

## 2015-01-29 NOTE — Patient Outreach (Signed)
[place] [facility]) Care Management  01/29/2015  [name] 10-17-1955 [id number]    [name] is a 59yo who I am following for smoking cessation. I called patient to follow up on smoking cessation.  On first outreach attempt, patient's phone was answered by her daughter who reported the patient was sleeping.  Patient's daughter requested I call back around noon.  I made another outreach call at noon and there was no answer. I left HIPAA compliant voicemail for patient to return my call. I will reach out within one week if patient does not return my call.    Elisabeth Most, Pharm.D. Pharmacy Resident [place] [phone]

## 2015-01-30 DIAGNOSIS — E118 Type 2 diabetes mellitus with unspecified complications: Secondary | ICD-10-CM | POA: Diagnosis not present

## 2015-01-30 DIAGNOSIS — D631 Anemia in chronic kidney disease: Secondary | ICD-10-CM | POA: Diagnosis not present

## 2015-01-30 DIAGNOSIS — N2581 Secondary hyperparathyroidism of renal origin: Secondary | ICD-10-CM | POA: Diagnosis not present

## 2015-01-30 DIAGNOSIS — N186 End stage renal disease: Secondary | ICD-10-CM | POA: Diagnosis not present

## 2015-01-30 DIAGNOSIS — I359 Nonrheumatic aortic valve disorder, unspecified: Secondary | ICD-10-CM | POA: Diagnosis not present

## 2015-01-30 LAB — PROTIME-INR: INR: 3.3 — AB (ref <=1.1)

## 2015-01-31 ENCOUNTER — Other Ambulatory Visit: Payer: Self-pay | Admitting: Pharmacist

## 2015-01-31 NOTE — Patient Outreach (Signed)
[place] [facility]) Care Management  01/31/2015  LANAJA VILLELA 11-Feb-1955 [id number]   Britanie Pereda is a 59yo who I am following for smoking cessation. I made a second outreach call patient to follow up on smoking cessation. There was no answer. I left HIPAA compliant voicemail for patient to return my call. I will reach out within one week if patient does not return my call.   Elisabeth Most, Pharm.D. Pharmacy Resident [place] [phone]

## 2015-02-01 ENCOUNTER — Ambulatory Visit (INDEPENDENT_AMBULATORY_CARE_PROVIDER_SITE_OTHER): Payer: Medicare Other | Admitting: Pharmacist Clinician (PhC)/ Clinical Pharmacy Specialist

## 2015-02-01 DIAGNOSIS — D631 Anemia in chronic kidney disease: Secondary | ICD-10-CM | POA: Diagnosis not present

## 2015-02-01 DIAGNOSIS — N2581 Secondary hyperparathyroidism of renal origin: Secondary | ICD-10-CM | POA: Diagnosis not present

## 2015-02-01 DIAGNOSIS — Z954 Presence of other heart-valve replacement: Secondary | ICD-10-CM

## 2015-02-01 DIAGNOSIS — I359 Nonrheumatic aortic valve disorder, unspecified: Secondary | ICD-10-CM | POA: Diagnosis not present

## 2015-02-01 DIAGNOSIS — N186 End stage renal disease: Secondary | ICD-10-CM | POA: Diagnosis not present

## 2015-02-01 DIAGNOSIS — E039 Hypothyroidism, unspecified: Secondary | ICD-10-CM | POA: Diagnosis not present

## 2015-02-01 DIAGNOSIS — Z952 Presence of prosthetic heart valve: Secondary | ICD-10-CM

## 2015-02-01 DIAGNOSIS — Z7901 Long term (current) use of anticoagulants: Secondary | ICD-10-CM

## 2015-02-01 DIAGNOSIS — E118 Type 2 diabetes mellitus with unspecified complications: Secondary | ICD-10-CM | POA: Diagnosis not present

## 2015-02-02 LAB — PROTIME-INR: INR: 2.8 — AB (ref 0.9–1.1)

## 2015-02-03 DIAGNOSIS — E118 Type 2 diabetes mellitus with unspecified complications: Secondary | ICD-10-CM | POA: Diagnosis not present

## 2015-02-03 DIAGNOSIS — N2581 Secondary hyperparathyroidism of renal origin: Secondary | ICD-10-CM | POA: Diagnosis not present

## 2015-02-03 DIAGNOSIS — N186 End stage renal disease: Secondary | ICD-10-CM | POA: Diagnosis not present

## 2015-02-03 DIAGNOSIS — D631 Anemia in chronic kidney disease: Secondary | ICD-10-CM | POA: Diagnosis not present

## 2015-02-06 ENCOUNTER — Encounter: Payer: Self-pay | Admitting: Pharmacist

## 2015-02-06 ENCOUNTER — Other Ambulatory Visit: Payer: Self-pay | Admitting: Pharmacist

## 2015-02-06 DIAGNOSIS — I359 Nonrheumatic aortic valve disorder, unspecified: Secondary | ICD-10-CM | POA: Diagnosis not present

## 2015-02-06 DIAGNOSIS — N2581 Secondary hyperparathyroidism of renal origin: Secondary | ICD-10-CM | POA: Diagnosis not present

## 2015-02-06 DIAGNOSIS — D631 Anemia in chronic kidney disease: Secondary | ICD-10-CM | POA: Diagnosis not present

## 2015-02-06 DIAGNOSIS — E118 Type 2 diabetes mellitus with unspecified complications: Secondary | ICD-10-CM | POA: Diagnosis not present

## 2015-02-06 DIAGNOSIS — N186 End stage renal disease: Secondary | ICD-10-CM | POA: Diagnosis not present

## 2015-02-06 LAB — PROTIME-INR: INR: 1.3 — AB (ref 0.9–1.1)

## 2015-02-06 NOTE — Patient Outreach (Signed)
[place] [facility]) Care Management  02/06/2015  DEMETRIUS THEILEN [birth year] [id number]   Vianca Gardenhire is a 59yo who I am following for smoking cessation. I made a third outreach call patient to follow up on smoking cessation. There was no answer. I left HIPAA compliant voicemail for patient to return my call. I will mail patient outreach letter.     Elisabeth Most, Pharm.D. Pharmacy Resident [place] [phone]

## 2015-02-07 DIAGNOSIS — Z5181 Encounter for therapeutic drug level monitoring: Secondary | ICD-10-CM | POA: Diagnosis not present

## 2015-02-07 DIAGNOSIS — Z944 Liver transplant status: Secondary | ICD-10-CM | POA: Diagnosis not present

## 2015-02-08 ENCOUNTER — Ambulatory Visit (INDEPENDENT_AMBULATORY_CARE_PROVIDER_SITE_OTHER): Payer: Medicare Other | Admitting: Pharmacist Clinician (PhC)/ Clinical Pharmacy Specialist

## 2015-02-08 ENCOUNTER — Encounter: Payer: Self-pay | Admitting: Internal Medicine

## 2015-02-08 DIAGNOSIS — N186 End stage renal disease: Secondary | ICD-10-CM | POA: Diagnosis not present

## 2015-02-08 DIAGNOSIS — E118 Type 2 diabetes mellitus with unspecified complications: Secondary | ICD-10-CM | POA: Diagnosis not present

## 2015-02-08 DIAGNOSIS — N2581 Secondary hyperparathyroidism of renal origin: Secondary | ICD-10-CM | POA: Diagnosis not present

## 2015-02-08 DIAGNOSIS — Z952 Presence of prosthetic heart valve: Secondary | ICD-10-CM

## 2015-02-08 DIAGNOSIS — D631 Anemia in chronic kidney disease: Secondary | ICD-10-CM | POA: Diagnosis not present

## 2015-02-08 DIAGNOSIS — Z7901 Long term (current) use of anticoagulants: Secondary | ICD-10-CM

## 2015-02-08 DIAGNOSIS — Z954 Presence of other heart-valve replacement: Secondary | ICD-10-CM

## 2015-02-10 DIAGNOSIS — T8640 Unspecified complication of liver transplant: Secondary | ICD-10-CM | POA: Diagnosis not present

## 2015-02-10 DIAGNOSIS — Z992 Dependence on renal dialysis: Secondary | ICD-10-CM | POA: Diagnosis not present

## 2015-02-10 DIAGNOSIS — N2581 Secondary hyperparathyroidism of renal origin: Secondary | ICD-10-CM | POA: Diagnosis not present

## 2015-02-10 DIAGNOSIS — N186 End stage renal disease: Secondary | ICD-10-CM | POA: Diagnosis not present

## 2015-02-10 DIAGNOSIS — D631 Anemia in chronic kidney disease: Secondary | ICD-10-CM | POA: Diagnosis not present

## 2015-02-10 DIAGNOSIS — E118 Type 2 diabetes mellitus with unspecified complications: Secondary | ICD-10-CM | POA: Diagnosis not present

## 2015-02-13 DIAGNOSIS — N186 End stage renal disease: Secondary | ICD-10-CM | POA: Diagnosis not present

## 2015-02-13 DIAGNOSIS — N2581 Secondary hyperparathyroidism of renal origin: Secondary | ICD-10-CM | POA: Diagnosis not present

## 2015-02-13 DIAGNOSIS — D509 Iron deficiency anemia, unspecified: Secondary | ICD-10-CM | POA: Diagnosis not present

## 2015-02-13 DIAGNOSIS — E118 Type 2 diabetes mellitus with unspecified complications: Secondary | ICD-10-CM | POA: Diagnosis not present

## 2015-02-13 DIAGNOSIS — D631 Anemia in chronic kidney disease: Secondary | ICD-10-CM | POA: Diagnosis not present

## 2015-02-13 DIAGNOSIS — I359 Nonrheumatic aortic valve disorder, unspecified: Secondary | ICD-10-CM | POA: Diagnosis not present

## 2015-02-13 LAB — PROTIME-INR: INR: 1.5 — AB (ref 0.9–1.1)

## 2015-02-14 ENCOUNTER — Ambulatory Visit (INDEPENDENT_AMBULATORY_CARE_PROVIDER_SITE_OTHER): Payer: Medicare Other | Admitting: Pharmacist Clinician (PhC)/ Clinical Pharmacy Specialist

## 2015-02-14 DIAGNOSIS — Z952 Presence of prosthetic heart valve: Secondary | ICD-10-CM

## 2015-02-14 DIAGNOSIS — Z7901 Long term (current) use of anticoagulants: Secondary | ICD-10-CM

## 2015-02-14 DIAGNOSIS — Z954 Presence of other heart-valve replacement: Secondary | ICD-10-CM

## 2015-02-20 ENCOUNTER — Other Ambulatory Visit: Payer: Self-pay | Admitting: Pharmacist

## 2015-02-20 ENCOUNTER — Encounter: Payer: Self-pay | Admitting: Pharmacist

## 2015-02-20 NOTE — Patient Outreach (Signed)
[place] [facility]) Care Management  02/20/2015  [name] Jun 29, 1955 [id number]   [name] is a 60yo who I am following for smoking cessation. I made three unsuccessful outreach attempts to the patient to follow up on smoking cessation and mailed a patient outreach letter on 02/06/15 with no response.  I will close pharmacy program per protocol at this time.  Will mail closure letter to patient and patient's primary care provider.     Elisabeth Most, Pharm.D. Pharmacy Resident [place] [phone]

## 2015-02-27 DIAGNOSIS — I359 Nonrheumatic aortic valve disorder, unspecified: Secondary | ICD-10-CM | POA: Diagnosis not present

## 2015-02-27 LAB — PROTIME-INR: INR: 3.2 — AB (ref 0.9–1.1)

## 2015-02-28 DIAGNOSIS — H9313 Tinnitus, bilateral: Secondary | ICD-10-CM | POA: Diagnosis not present

## 2015-03-01 ENCOUNTER — Ambulatory Visit (INDEPENDENT_AMBULATORY_CARE_PROVIDER_SITE_OTHER): Payer: Medicare Other | Admitting: Pharmacist Clinician (PhC)/ Clinical Pharmacy Specialist

## 2015-03-01 DIAGNOSIS — Z952 Presence of prosthetic heart valve: Secondary | ICD-10-CM

## 2015-03-01 DIAGNOSIS — Z7901 Long term (current) use of anticoagulants: Secondary | ICD-10-CM

## 2015-03-01 DIAGNOSIS — Z954 Presence of other heart-valve replacement: Secondary | ICD-10-CM

## 2015-03-06 DIAGNOSIS — I359 Nonrheumatic aortic valve disorder, unspecified: Secondary | ICD-10-CM | POA: Diagnosis not present

## 2015-03-06 LAB — PROTIME-INR: INR: 5.2 — AB (ref 0.9–1.1)

## 2015-03-07 DIAGNOSIS — H9121 Sudden idiopathic hearing loss, right ear: Secondary | ICD-10-CM | POA: Diagnosis not present

## 2015-03-07 DIAGNOSIS — Z6827 Body mass index (BMI) 27.0-27.9, adult: Secondary | ICD-10-CM | POA: Diagnosis not present

## 2015-03-07 DIAGNOSIS — H903 Sensorineural hearing loss, bilateral: Secondary | ICD-10-CM | POA: Diagnosis not present

## 2015-03-07 DIAGNOSIS — H9313 Tinnitus, bilateral: Secondary | ICD-10-CM | POA: Diagnosis not present

## 2015-03-08 ENCOUNTER — Ambulatory Visit (INDEPENDENT_AMBULATORY_CARE_PROVIDER_SITE_OTHER): Payer: Medicare Other | Admitting: Pharmacist Clinician (PhC)/ Clinical Pharmacy Specialist

## 2015-03-08 DIAGNOSIS — Z954 Presence of other heart-valve replacement: Secondary | ICD-10-CM

## 2015-03-08 DIAGNOSIS — I359 Nonrheumatic aortic valve disorder, unspecified: Secondary | ICD-10-CM | POA: Diagnosis not present

## 2015-03-08 DIAGNOSIS — E118 Type 2 diabetes mellitus with unspecified complications: Secondary | ICD-10-CM | POA: Diagnosis not present

## 2015-03-08 DIAGNOSIS — E039 Hypothyroidism, unspecified: Secondary | ICD-10-CM | POA: Diagnosis not present

## 2015-03-08 DIAGNOSIS — Z952 Presence of prosthetic heart valve: Secondary | ICD-10-CM

## 2015-03-08 DIAGNOSIS — Z7901 Long term (current) use of anticoagulants: Secondary | ICD-10-CM

## 2015-03-09 ENCOUNTER — Telehealth: Payer: Self-pay | Admitting: Pharmacist Clinician (PhC)/ Clinical Pharmacy Specialist

## 2015-03-09 DIAGNOSIS — S60212A Contusion of left wrist, initial encounter: Secondary | ICD-10-CM | POA: Diagnosis not present

## 2015-03-09 DIAGNOSIS — E1122 Type 2 diabetes mellitus with diabetic chronic kidney disease: Secondary | ICD-10-CM | POA: Diagnosis not present

## 2015-03-09 DIAGNOSIS — I120 Hypertensive chronic kidney disease with stage 5 chronic kidney disease or end stage renal disease: Secondary | ICD-10-CM | POA: Diagnosis not present

## 2015-03-09 DIAGNOSIS — Z952 Presence of prosthetic heart valve: Secondary | ICD-10-CM | POA: Diagnosis not present

## 2015-03-09 DIAGNOSIS — S301XXA Contusion of abdominal wall, initial encounter: Secondary | ICD-10-CM | POA: Diagnosis not present

## 2015-03-09 DIAGNOSIS — S199XXA Unspecified injury of neck, initial encounter: Secondary | ICD-10-CM | POA: Diagnosis not present

## 2015-03-09 DIAGNOSIS — K219 Gastro-esophageal reflux disease without esophagitis: Secondary | ICD-10-CM | POA: Diagnosis not present

## 2015-03-09 DIAGNOSIS — M25532 Pain in left wrist: Secondary | ICD-10-CM | POA: Diagnosis not present

## 2015-03-09 DIAGNOSIS — S6992XA Unspecified injury of left wrist, hand and finger(s), initial encounter: Secondary | ICD-10-CM | POA: Diagnosis not present

## 2015-03-09 DIAGNOSIS — S0990XA Unspecified injury of head, initial encounter: Secondary | ICD-10-CM | POA: Diagnosis not present

## 2015-03-09 DIAGNOSIS — I509 Heart failure, unspecified: Secondary | ICD-10-CM | POA: Diagnosis not present

## 2015-03-09 DIAGNOSIS — Z992 Dependence on renal dialysis: Secondary | ICD-10-CM | POA: Diagnosis not present

## 2015-03-09 DIAGNOSIS — J45909 Unspecified asthma, uncomplicated: Secondary | ICD-10-CM | POA: Diagnosis not present

## 2015-03-09 DIAGNOSIS — N186 End stage renal disease: Secondary | ICD-10-CM | POA: Diagnosis not present

## 2015-03-09 DIAGNOSIS — F17200 Nicotine dependence, unspecified, uncomplicated: Secondary | ICD-10-CM | POA: Diagnosis not present

## 2015-03-09 DIAGNOSIS — J449 Chronic obstructive pulmonary disease, unspecified: Secondary | ICD-10-CM | POA: Diagnosis not present

## 2015-03-09 DIAGNOSIS — R55 Syncope and collapse: Secondary | ICD-10-CM | POA: Diagnosis not present

## 2015-03-09 DIAGNOSIS — Z7901 Long term (current) use of anticoagulants: Secondary | ICD-10-CM | POA: Diagnosis not present

## 2015-03-09 DIAGNOSIS — S3991XA Unspecified injury of abdomen, initial encounter: Secondary | ICD-10-CM | POA: Diagnosis not present

## 2015-03-09 DIAGNOSIS — Z79899 Other long term (current) drug therapy: Secondary | ICD-10-CM | POA: Diagnosis not present

## 2015-03-09 DIAGNOSIS — S299XXA Unspecified injury of thorax, initial encounter: Secondary | ICD-10-CM | POA: Diagnosis not present

## 2015-03-09 NOTE — Telephone Encounter (Signed)
Dtr states dialysis did a stat INR yesterday, came in at 3.3.  Advised to still hold today's dose, restart Saturday instead of Sunday.  Daughter voiced understanding.

## 2015-03-09 NOTE — Telephone Encounter (Signed)
New message      Talk to the pharmacist again regarding her mother

## 2015-03-12 ENCOUNTER — Telehealth: Payer: Self-pay | Admitting: Pharmacist Clinician (PhC)/ Clinical Pharmacy Specialist

## 2015-03-12 DIAGNOSIS — Z944 Liver transplant status: Secondary | ICD-10-CM | POA: Diagnosis not present

## 2015-03-12 DIAGNOSIS — Z5181 Encounter for therapeutic drug level monitoring: Secondary | ICD-10-CM | POA: Diagnosis not present
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
## 2015-03-13 ENCOUNTER — Ambulatory Visit (INDEPENDENT_AMBULATORY_CARE_PROVIDER_SITE_OTHER): Payer: Medicare Other | Admitting: Pharmacist Clinician (PhC)/ Clinical Pharmacy Specialist

## 2015-03-13 DIAGNOSIS — Z992 Dependence on renal dialysis: Secondary | ICD-10-CM | POA: Diagnosis not present

## 2015-03-13 DIAGNOSIS — I359 Nonrheumatic aortic valve disorder, unspecified: Secondary | ICD-10-CM | POA: Diagnosis not present

## 2015-03-13 DIAGNOSIS — N186 End stage renal disease: Secondary | ICD-10-CM | POA: Diagnosis not present

## 2015-03-13 DIAGNOSIS — Z954 Presence of other heart-valve replacement: Secondary | ICD-10-CM

## 2015-03-13 DIAGNOSIS — Z952 Presence of prosthetic heart valve: Secondary | ICD-10-CM

## 2015-03-13 DIAGNOSIS — T8640 Unspecified complication of liver transplant: Secondary | ICD-10-CM | POA: Diagnosis not present

## 2015-03-13 DIAGNOSIS — Z7901 Long term (current) use of anticoagulants: Secondary | ICD-10-CM

## 2015-03-13 LAB — PROTIME-INR: INR: 3.4 — AB (ref 0.9–1.1)

## 2015-03-15 DIAGNOSIS — D631 Anemia in chronic kidney disease: Secondary | ICD-10-CM | POA: Diagnosis not present

## 2015-03-15 DIAGNOSIS — D509 Iron deficiency anemia, unspecified: Secondary | ICD-10-CM | POA: Diagnosis not present

## 2015-03-15 DIAGNOSIS — E118 Type 2 diabetes mellitus with unspecified complications: Secondary | ICD-10-CM | POA: Diagnosis not present

## 2015-03-15 DIAGNOSIS — N2581 Secondary hyperparathyroidism of renal origin: Secondary | ICD-10-CM | POA: Diagnosis not present

## 2015-03-15 DIAGNOSIS — N186 End stage renal disease: Secondary | ICD-10-CM | POA: Diagnosis not present

## 2015-03-19 DIAGNOSIS — H903 Sensorineural hearing loss, bilateral: Secondary | ICD-10-CM | POA: Diagnosis not present

## 2015-03-19 DIAGNOSIS — Z6827 Body mass index (BMI) 27.0-27.9, adult: Secondary | ICD-10-CM | POA: Diagnosis not present

## 2015-03-20 DIAGNOSIS — I359 Nonrheumatic aortic valve disorder, unspecified: Secondary | ICD-10-CM | POA: Diagnosis not present

## 2015-03-20 LAB — PROTIME-INR: INR: 2.8 — AB (ref 0.9–1.1)

## 2015-03-22 ENCOUNTER — Ambulatory Visit (INDEPENDENT_AMBULATORY_CARE_PROVIDER_SITE_OTHER): Payer: Medicare Other | Admitting: Pharmacist Clinician (PhC)/ Clinical Pharmacy Specialist

## 2015-03-22 ENCOUNTER — Ambulatory Visit: Payer: Self-pay | Admitting: Pharmacist Clinician (PhC)/ Clinical Pharmacy Specialist

## 2015-03-22 DIAGNOSIS — Z7901 Long term (current) use of anticoagulants: Secondary | ICD-10-CM

## 2015-03-22 DIAGNOSIS — Z954 Presence of other heart-valve replacement: Secondary | ICD-10-CM

## 2015-03-22 DIAGNOSIS — Z952 Presence of prosthetic heart valve: Secondary | ICD-10-CM

## 2015-03-27 DIAGNOSIS — I359 Nonrheumatic aortic valve disorder, unspecified: Secondary | ICD-10-CM | POA: Diagnosis not present

## 2015-03-27 LAB — PROTIME-INR: INR: 1.9 — AB (ref 0.9–1.1)

## 2015-03-29 ENCOUNTER — Ambulatory Visit (INDEPENDENT_AMBULATORY_CARE_PROVIDER_SITE_OTHER): Payer: Medicare Other | Admitting: Pharmacist Clinician (PhC)/ Clinical Pharmacy Specialist

## 2015-03-29 DIAGNOSIS — Z7901 Long term (current) use of anticoagulants: Secondary | ICD-10-CM

## 2015-03-29 DIAGNOSIS — Z954 Presence of other heart-valve replacement: Secondary | ICD-10-CM

## 2015-03-29 DIAGNOSIS — Z952 Presence of prosthetic heart valve: Secondary | ICD-10-CM

## 2015-03-30 DIAGNOSIS — B182 Chronic viral hepatitis C: Secondary | ICD-10-CM | POA: Diagnosis not present

## 2015-03-30 DIAGNOSIS — Z5181 Encounter for therapeutic drug level monitoring: Secondary | ICD-10-CM | POA: Diagnosis not present

## 2015-03-30 DIAGNOSIS — Z944 Liver transplant status: Secondary | ICD-10-CM | POA: Diagnosis not present

## 2015-04-03 DIAGNOSIS — I359 Nonrheumatic aortic valve disorder, unspecified: Secondary | ICD-10-CM | POA: Diagnosis not present

## 2015-04-03 LAB — PROTIME-INR: INR: 1.7 — AB (ref 0.9–1.1)

## 2015-04-05 ENCOUNTER — Ambulatory Visit (INDEPENDENT_AMBULATORY_CARE_PROVIDER_SITE_OTHER): Payer: Medicare Other | Admitting: Pharmacist Clinician (PhC)/ Clinical Pharmacy Specialist

## 2015-04-05 DIAGNOSIS — Z954 Presence of other heart-valve replacement: Secondary | ICD-10-CM

## 2015-04-05 DIAGNOSIS — Z7901 Long term (current) use of anticoagulants: Secondary | ICD-10-CM

## 2015-04-05 DIAGNOSIS — Z952 Presence of prosthetic heart valve: Secondary | ICD-10-CM

## 2015-04-05 DIAGNOSIS — I359 Nonrheumatic aortic valve disorder, unspecified: Secondary | ICD-10-CM | POA: Diagnosis not present

## 2015-04-05 LAB — PROTIME-INR: INR: 1.7 — AB (ref 0.9–1.1)

## 2015-04-09 DIAGNOSIS — Z5181 Encounter for therapeutic drug level monitoring: Secondary | ICD-10-CM | POA: Diagnosis not present

## 2015-04-09 DIAGNOSIS — Z944 Liver transplant status: Secondary | ICD-10-CM | POA: Diagnosis not present

## 2015-04-10 ENCOUNTER — Ambulatory Visit (INDEPENDENT_AMBULATORY_CARE_PROVIDER_SITE_OTHER): Payer: Medicare Other | Admitting: Pharmacist Clinician (PhC)/ Clinical Pharmacy Specialist

## 2015-04-10 DIAGNOSIS — Z7901 Long term (current) use of anticoagulants: Secondary | ICD-10-CM

## 2015-04-10 DIAGNOSIS — I359 Nonrheumatic aortic valve disorder, unspecified: Secondary | ICD-10-CM | POA: Diagnosis not present

## 2015-04-10 DIAGNOSIS — T8640 Unspecified complication of liver transplant: Secondary | ICD-10-CM | POA: Diagnosis not present

## 2015-04-10 DIAGNOSIS — Z992 Dependence on renal dialysis: Secondary | ICD-10-CM | POA: Diagnosis not present

## 2015-04-10 DIAGNOSIS — N186 End stage renal disease: Secondary | ICD-10-CM | POA: Diagnosis not present

## 2015-04-10 DIAGNOSIS — Z954 Presence of other heart-valve replacement: Secondary | ICD-10-CM

## 2015-04-10 DIAGNOSIS — Z952 Presence of prosthetic heart valve: Secondary | ICD-10-CM

## 2015-04-12 ENCOUNTER — Ambulatory Visit (INDEPENDENT_AMBULATORY_CARE_PROVIDER_SITE_OTHER): Payer: Medicare Other | Admitting: Pharmacist Clinician (PhC)/ Clinical Pharmacy Specialist

## 2015-04-12 DIAGNOSIS — Z23 Encounter for immunization: Secondary | ICD-10-CM | POA: Diagnosis not present

## 2015-04-12 DIAGNOSIS — Z952 Presence of prosthetic heart valve: Secondary | ICD-10-CM

## 2015-04-12 DIAGNOSIS — E118 Type 2 diabetes mellitus with unspecified complications: Secondary | ICD-10-CM | POA: Diagnosis not present

## 2015-04-12 DIAGNOSIS — N186 End stage renal disease: Secondary | ICD-10-CM | POA: Diagnosis not present

## 2015-04-12 DIAGNOSIS — Z7901 Long term (current) use of anticoagulants: Secondary | ICD-10-CM

## 2015-04-12 DIAGNOSIS — D631 Anemia in chronic kidney disease: Secondary | ICD-10-CM | POA: Diagnosis not present

## 2015-04-12 DIAGNOSIS — D509 Iron deficiency anemia, unspecified: Secondary | ICD-10-CM | POA: Diagnosis not present

## 2015-04-12 DIAGNOSIS — N2581 Secondary hyperparathyroidism of renal origin: Secondary | ICD-10-CM | POA: Diagnosis not present

## 2015-04-12 DIAGNOSIS — Z954 Presence of other heart-valve replacement: Secondary | ICD-10-CM

## 2015-04-12 LAB — PROTIME-INR: INR: 1.6 — AB (ref 0.9–1.1)

## 2015-04-14 DIAGNOSIS — E118 Type 2 diabetes mellitus with unspecified complications: Secondary | ICD-10-CM | POA: Diagnosis not present

## 2015-04-14 DIAGNOSIS — D509 Iron deficiency anemia, unspecified: Secondary | ICD-10-CM | POA: Diagnosis not present

## 2015-04-14 DIAGNOSIS — Z23 Encounter for immunization: Secondary | ICD-10-CM | POA: Diagnosis not present

## 2015-04-14 DIAGNOSIS — N186 End stage renal disease: Secondary | ICD-10-CM | POA: Diagnosis not present

## 2015-04-14 DIAGNOSIS — N2581 Secondary hyperparathyroidism of renal origin: Secondary | ICD-10-CM | POA: Diagnosis not present

## 2015-04-16 DIAGNOSIS — E039 Hypothyroidism, unspecified: Secondary | ICD-10-CM | POA: Diagnosis not present

## 2015-04-16 DIAGNOSIS — J40 Bronchitis, not specified as acute or chronic: Secondary | ICD-10-CM | POA: Diagnosis not present

## 2015-04-17 DIAGNOSIS — N2581 Secondary hyperparathyroidism of renal origin: Secondary | ICD-10-CM | POA: Diagnosis not present

## 2015-04-17 DIAGNOSIS — N186 End stage renal disease: Secondary | ICD-10-CM | POA: Diagnosis not present

## 2015-04-17 DIAGNOSIS — I359 Nonrheumatic aortic valve disorder, unspecified: Secondary | ICD-10-CM | POA: Diagnosis not present

## 2015-04-17 DIAGNOSIS — E118 Type 2 diabetes mellitus with unspecified complications: Secondary | ICD-10-CM | POA: Diagnosis not present

## 2015-04-17 DIAGNOSIS — Z23 Encounter for immunization: Secondary | ICD-10-CM | POA: Diagnosis not present

## 2015-04-17 DIAGNOSIS — D509 Iron deficiency anemia, unspecified: Secondary | ICD-10-CM | POA: Diagnosis not present

## 2015-04-17 LAB — PROTIME-INR: INR: 1.6 — AB (ref 0.9–1.1)

## 2015-04-19 ENCOUNTER — Ambulatory Visit (INDEPENDENT_AMBULATORY_CARE_PROVIDER_SITE_OTHER): Payer: Medicare Other | Admitting: Pharmacist Clinician (PhC)/ Clinical Pharmacy Specialist

## 2015-04-19 DIAGNOSIS — Z954 Presence of other heart-valve replacement: Secondary | ICD-10-CM

## 2015-04-19 DIAGNOSIS — Z7901 Long term (current) use of anticoagulants: Secondary | ICD-10-CM

## 2015-04-19 DIAGNOSIS — Z23 Encounter for immunization: Secondary | ICD-10-CM | POA: Diagnosis not present

## 2015-04-19 DIAGNOSIS — D509 Iron deficiency anemia, unspecified: Secondary | ICD-10-CM | POA: Diagnosis not present

## 2015-04-19 DIAGNOSIS — N186 End stage renal disease: Secondary | ICD-10-CM | POA: Diagnosis not present

## 2015-04-19 DIAGNOSIS — E118 Type 2 diabetes mellitus with unspecified complications: Secondary | ICD-10-CM | POA: Diagnosis not present

## 2015-04-19 DIAGNOSIS — Z952 Presence of prosthetic heart valve: Secondary | ICD-10-CM

## 2015-04-19 DIAGNOSIS — N2581 Secondary hyperparathyroidism of renal origin: Secondary | ICD-10-CM | POA: Diagnosis not present

## 2015-04-21 ENCOUNTER — Emergency Department (HOSPITAL_COMMUNITY): Payer: Medicare Other

## 2015-04-21 ENCOUNTER — Emergency Department (HOSPITAL_COMMUNITY)
Admission: EM | Admit: 2015-04-21 | Discharge: 2015-04-21 | Disposition: A | Discharge status: Home / Self Care | Payer: Medicare Other | Attending: Emergency Medicine | Admitting: Emergency Medicine

## 2015-04-21 ENCOUNTER — Encounter (HOSPITAL_COMMUNITY): Payer: Self-pay | Admitting: Emergency Medicine

## 2015-04-21 DIAGNOSIS — Z7982 Long term (current) use of aspirin: Secondary | ICD-10-CM | POA: Insufficient documentation

## 2015-04-21 DIAGNOSIS — J441 Chronic obstructive pulmonary disease with (acute) exacerbation: Secondary | ICD-10-CM | POA: Diagnosis not present

## 2015-04-21 DIAGNOSIS — N186 End stage renal disease: Secondary | ICD-10-CM | POA: Insufficient documentation

## 2015-04-21 DIAGNOSIS — K219 Gastro-esophageal reflux disease without esophagitis: Secondary | ICD-10-CM | POA: Insufficient documentation

## 2015-04-21 DIAGNOSIS — F17210 Nicotine dependence, cigarettes, uncomplicated: Secondary | ICD-10-CM | POA: Insufficient documentation

## 2015-04-21 DIAGNOSIS — E039 Hypothyroidism, unspecified: Secondary | ICD-10-CM | POA: Insufficient documentation

## 2015-04-21 DIAGNOSIS — G8929 Other chronic pain: Secondary | ICD-10-CM | POA: Insufficient documentation

## 2015-04-21 DIAGNOSIS — I120 Hypertensive chronic kidney disease with stage 5 chronic kidney disease or end stage renal disease: Secondary | ICD-10-CM | POA: Insufficient documentation

## 2015-04-21 DIAGNOSIS — Z79899 Other long term (current) drug therapy: Secondary | ICD-10-CM | POA: Insufficient documentation

## 2015-04-21 DIAGNOSIS — Z7901 Long term (current) use of anticoagulants: Secondary | ICD-10-CM | POA: Insufficient documentation

## 2015-04-21 DIAGNOSIS — E119 Type 2 diabetes mellitus without complications: Secondary | ICD-10-CM | POA: Insufficient documentation

## 2015-04-21 DIAGNOSIS — Z794 Long term (current) use of insulin: Secondary | ICD-10-CM | POA: Diagnosis not present

## 2015-04-21 DIAGNOSIS — R0600 Dyspnea, unspecified: Secondary | ICD-10-CM | POA: Diagnosis present

## 2015-04-21 DIAGNOSIS — F329 Major depressive disorder, single episode, unspecified: Secondary | ICD-10-CM | POA: Diagnosis not present

## 2015-04-21 DIAGNOSIS — Z8701 Personal history of pneumonia (recurrent): Secondary | ICD-10-CM | POA: Insufficient documentation

## 2015-04-21 DIAGNOSIS — Z992 Dependence on renal dialysis: Secondary | ICD-10-CM | POA: Insufficient documentation

## 2015-04-21 DIAGNOSIS — E669 Obesity, unspecified: Secondary | ICD-10-CM | POA: Insufficient documentation

## 2015-04-21 DIAGNOSIS — Z8619 Personal history of other infectious and parasitic diseases: Secondary | ICD-10-CM | POA: Diagnosis not present

## 2015-04-21 DIAGNOSIS — K5900 Constipation, unspecified: Secondary | ICD-10-CM | POA: Insufficient documentation

## 2015-04-21 DIAGNOSIS — I2510 Atherosclerotic heart disease of native coronary artery without angina pectoris: Secondary | ICD-10-CM | POA: Insufficient documentation

## 2015-04-21 DIAGNOSIS — R05 Cough: Secondary | ICD-10-CM | POA: Diagnosis not present

## 2015-04-21 DIAGNOSIS — D649 Anemia, unspecified: Secondary | ICD-10-CM | POA: Insufficient documentation

## 2015-04-21 DIAGNOSIS — M1990 Unspecified osteoarthritis, unspecified site: Secondary | ICD-10-CM | POA: Insufficient documentation

## 2015-04-21 DIAGNOSIS — R0602 Shortness of breath: Secondary | ICD-10-CM | POA: Diagnosis not present

## 2015-04-21 DIAGNOSIS — R069 Unspecified abnormalities of breathing: Secondary | ICD-10-CM | POA: Diagnosis not present

## 2015-04-21 DIAGNOSIS — Z9889 Other specified postprocedural states: Secondary | ICD-10-CM | POA: Insufficient documentation

## 2015-04-21 LAB — COMPREHENSIVE METABOLIC PANEL
ALT: 9 U/L — ABNORMAL LOW (ref 14–54)
AST: 15 U/L (ref 15–41)
Albumin: 3.4 g/dL — ABNORMAL LOW (ref 3.5–5.0)
Alkaline Phosphatase: 54 U/L (ref 38–126)
Anion gap: 15 (ref 5–15)
BUN: 21 mg/dL — AB (ref 6–20)
CHLORIDE: 98 mmol/L — AB (ref 101–111)
CO2: 29 mmol/L (ref 22–32)
Calcium: 9.4 mg/dL (ref 8.9–10.3)
Creatinine, Ser: 8.27 mg/dL — ABNORMAL HIGH (ref 0.44–1.00)
GFR calc Af Amer: 5 mL/min — ABNORMAL LOW (ref >60)
GFR calc non Af Amer: 5 mL/min — ABNORMAL LOW (ref >60)
GLUCOSE: 182 mg/dL — AB (ref 65–99)
POTASSIUM: 3.2 mmol/L — AB (ref 3.5–5.1)
Sodium: 142 mmol/L (ref 135–145)
Total Bilirubin: 0.9 mg/dL (ref 0.3–1.2)
Total Protein: 7.3 g/dL (ref 6.5–8.1)

## 2015-04-21 LAB — CBC WITH DIFFERENTIAL/PLATELET
Basophils Absolute: 0 10*3/uL (ref 0.0–0.1)
Basophils Relative: 0 %
EOS PCT: 0 %
Eosinophils Absolute: 0 10*3/uL (ref 0.0–0.7)
HCT: 30.3 % — ABNORMAL LOW (ref 36.0–46.0)
Hemoglobin: 9.8 g/dL — ABNORMAL LOW (ref 12.0–15.0)
LYMPHS ABS: 1.2 10*3/uL (ref 0.7–4.0)
LYMPHS PCT: 20 %
MCH: 33 pg (ref 26.0–34.0)
MCHC: 32.3 g/dL (ref 30.0–36.0)
MCV: 102 fL — AB (ref 78.0–100.0)
MONO ABS: 0.5 10*3/uL (ref 0.1–1.0)
MONOS PCT: 8 %
Neutro Abs: 4.4 10*3/uL (ref 1.7–7.7)
Neutrophils Relative %: 72 %
PLATELETS: 100 10*3/uL — AB (ref 150–400)
RBC: 2.97 MIL/uL — ABNORMAL LOW (ref 3.87–5.11)
RDW: 16.2 % — AB (ref 11.5–15.5)
WBC: 6.1 10*3/uL (ref 4.0–10.5)

## 2015-04-21 LAB — BASIC METABOLIC PANEL
ANION GAP: 15 (ref 5–15)
BUN: 22 mg/dL — ABNORMAL HIGH (ref 6–20)
CALCIUM: 8.9 mg/dL (ref 8.9–10.3)
CO2: 25 mmol/L (ref 22–32)
CREATININE: 8.62 mg/dL — AB (ref 0.44–1.00)
Chloride: 97 mmol/L — ABNORMAL LOW (ref 101–111)
GFR, EST AFRICAN AMERICAN: 5 mL/min — AB (ref >60)
GFR, EST NON AFRICAN AMERICAN: 4 mL/min — AB (ref >60)
Glucose, Bld: 194 mg/dL — ABNORMAL HIGH (ref 65–99)
Potassium: 3 mmol/L — ABNORMAL LOW (ref 3.5–5.1)
SODIUM: 137 mmol/L (ref 135–145)

## 2015-04-21 LAB — BRAIN NATRIURETIC PEPTIDE: B Natriuretic Peptide: 2040.6 pg/mL — ABNORMAL HIGH (ref 0.0–100.0)

## 2015-04-21 LAB — TROPONIN I: Troponin I: 0.06 ng/mL — ABNORMAL HIGH (ref <0.031)

## 2015-04-21 MED ORDER — DARBEPOETIN ALFA 60 MCG/0.3ML IJ SOSY: 60.0000 ug | PREFILLED_SYRINGE | INTRAMUSCULAR | Status: DC

## 2015-04-21 MED ORDER — NA FERRIC GLUC CPLX IN SUCROSE 12.5 MG/ML IV SOLN: 62.5000 mg | INTRAVENOUS | Status: DC

## 2015-04-21 MED ORDER — IPRATROPIUM-ALBUTEROL 0.5-2.5 (3) MG/3ML IN SOLN
3.0000 mL | Freq: Once | RESPIRATORY_TRACT | Status: AC
  Administered 2015-04-21: 3 mL via RESPIRATORY_TRACT
  Filled 2015-04-21: qty 3

## 2015-04-21 MED ORDER — CALCITRIOL 0.5 MCG PO CAPS: 1.0000 ug | ORAL_CAPSULE | ORAL | Status: DC

## 2015-04-21 NOTE — ED Notes (Signed)
Work of breathing increased. Cough and congestion for 2 weeks, on amoxicillin. Usually wears home oxygen prn, this week wearing it 24/7. States WOB increased with any activity. Albuterol neb in route. Sats 100% on 3L on arrival.

## 2015-04-21 NOTE — ED Provider Notes (Signed)
CSN: [id number]     Arrival date & time 04/21/15  0715 History   First MD Initiated Contact with Patient 04/21/15 [phone]     Chief Complaint  Patient presents with  . Respiratory Distress     (Consider location/radiation/quality/duration/timing/severity/associated sxs/prior Treatment) Patient is a 60 y.o. female presenting with shortness of breath.  Shortness of Breath Severity:  Moderate Onset quality:  Gradual Duration:  2 weeks Timing:  Constant Progression:  Worsening Chronicity:  New Context: not activity, not emotional upset and not pollens   Relieved by:  None tried Worsened by:  Nothing tried Ineffective treatments:  None tried Associated symptoms: chest pain (episodic), cough and PND   Associated symptoms: no abdominal pain, no fever, no rash, no sore throat and no sputum production     Past Medical History  Diagnosis Date  . 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 Chronic back pain   . CAD (coronary artery disease)   . Obesity   . Peripheral vascular disease (HCC) hands and legs  . Anxiety   . Asthma   . GERD (gastroesophageal reflux disease)     takes Omeprazole daily  . Chronic constipation     takes MIralax and Colace daily  . Anemia     takes Folic Acid daily  . Hypothyroidism     takes Synthroid daily  . Depression     takes Cymbalta for "severe" depression  . Neuromuscular disorder ([place])     carpal tunnel in right hand  . Hypertension     takes Metoprolol and Lisinopril daily, sees Dr Bea Graff  . COPD (chronic obstructive pulmonary disease) ([place])   . Pneumonia     "today and several times before" (08/30/2012)  . Chronic bronchitis ([place])     "q yr w/season changes" (08/30/2012)  . Type II diabetes mellitus (HCC)     Levemir 2units daily if > 150  . History of blood transfusion     "several" (08/30/2012)  . Hepatitis C   . Migraine     "last migraine was in 2013"  (08/30/2012)  . Headache     "at least monthly" (08/30/2012)  . Arthritis     "left hand, back" (08/30/2012)  . End stage renal disease on dialysis [facility]) 02/27/2011    Kidneys shut down at time of liver transplant in Sept 2011 at [facility] in [place], she has been on HD ever since.  Dialyzes at [facility] HD on TTS schedule.  Had L forearm graft used 10 months then removed Dec 2012 due to suspected infection.  A right upper arm AVG was placed Dec 2012 but she developed steal symptoms acutely and it was ligated the same day.  Never had an AV fistula due to small veins.  Now has L thigh AVG put in Jan 2013, has not clotted to date.    [name] CAD (coronary artery disease) Jan. 2015    Cath: 20% LAD, 50% D1; s/p LIMA-LAD  . Aortic stenosis   . Tobacco abuse   . S/P aortic valve replacement 03/15/13    Mechanical   . SVT (supraventricular tachycardia) ([place]) 06/09/14   Past Surgical History  Procedure Laterality Date  . Liver transplant  10/25/2009    sees Dr Ferol Luz 1 every 6 months, saw last in Dec 2013. [name] Coord [phone]  . Small intestine surgery  90's  . Thrombectomy    .  Arteriovenous graft placement Left 10/03/10     forearm  . Avgg removal  12/23/2010    Procedure: REMOVAL OF ARTERIOVENOUS GORETEX GRAFT ([place]);  Surgeon: Elam Dutch, MD;  Location: [place];  Service: Vascular;  Laterality: Left;  procedure started @1736 [id number]  . Insertion of dialysis catheter  12/23/2010    Procedure: INSERTION OF DIALYSIS CATHETER;  Surgeon: Elam Dutch, MD;  Location: [place];  Service: Vascular;  Laterality: Right;  Right Internal Jugular 28cm dialysis catheter insertion procedure time 1701-1720   . Cholecystectomy  1993  . Cystoscopy  1990's  . Spinal growth rods  2010    "put 2 metal rods in my back; they had detetriorated" (08/30/2012)  . Av fistula placement  01/29/2011    Procedure: INSERTION OF ARTERIOVENOUS (AV) GORE-TEX GRAFT ARM;  Surgeon: Elam Dutch, MD;  Location: [place];   Service: Vascular;  Laterality: Right;  . Av fistula placement  03/10/2011    Procedure: INSERTION OF ARTERIOVENOUS (AV) GORE-TEX GRAFT THIGH;  Surgeon: Elam Dutch, MD;  Location: [place];  Service: Vascular;  Laterality: Left;  . Tubal ligation  1990's  . Aortic valve replacement N/A 03/15/2013    AVR; Surgeon: Ivin Poot, MD;  Location: [facility] OR; Open Heart Surgery;  50mm[employer] mechanical prosthesis, top hat valve  . Coronary artery bypass graft N/A 03/15/2013    Procedure: CORONARY ARTERY BYPASS GRAFTING (CABG) times one using left internal mammary artery.;  Surgeon: Ivin Poot, MD;  Location: [place];  Service: Open Heart Surgery;  Laterality: N/A;  POSS CABG X 1  . Intraoperative transesophageal echocardiogram N/A 03/15/2013    Procedure: INTRAOPERATIVE TRANSESOPHAGEAL ECHOCARDIOGRAM;  Surgeon: Ivin Poot, MD;  Location: [place];  Service: Open Heart Surgery;  Laterality: N/A;  . Left heart catheterization with coronary/graft angiogram N/A 12/24/2011    Procedure: LEFT HEART CATHETERIZATION WITH Beatrix Fetters;  Surgeon: [name], MD;  Location: [facility] CATH LAB;  Service: Cardiovascular;  Laterality: N/A;  . Left heart catheterization with coronary angiogram N/A 07/29/2012    Procedure: LEFT HEART CATHETERIZATION WITH CORONARY ANGIOGRAM;  Surgeon: Troy Sine, MD;  Location: [facility] CATH LAB;  Service: Cardiovascular;  Laterality: N/A;  . Left heart catheterization with coronary angiogram N/A 03/10/2013    Procedure: LEFT HEART CATHETERIZATION WITH CORONARY ANGIOGRAM;  Surgeon: Troy Sine, MD;  Location: [facility] CATH LAB;  Service: Cardiovascular;  Laterality: N/A;  . Left heart catheterization with coronary/graft angiogram N/A 12/16/2013    Procedure: LEFT HEART CATHETERIZATION WITH Beatrix Fetters;  Surgeon: Troy Sine, MD;  Location: [facility] CATH LAB;  Service: Cardiovascular;  Laterality: N/A;  . Thrombectomy and revision of arterioventous (av) goretex  graft Left  03/30/2014  . Thrombectomy and revision of arterioventous (av) goretex  graft Left 03/30/2014    Procedure: THROMBECTOMY AND REVISION OF ARTERIOVENTOUS (AV) GORETEX  GRAFT;  Surgeon: Conrad Lund, MD;  Location: [place];  Service: Vascular;  Laterality: Left;  . Shuntogram Left 05/15/2014    Procedure: SHUNTOGRAM;  Surgeon: Conrad , MD;  Location: [facility] CATH LAB;  Service: Cardiovascular;  Laterality: Left;   Family History  Problem Relation Age of Onset  . Cancer Mother   . Diabetes Mother   . Hypertension Mother   . Stroke Mother   . Cancer Father   . Anesthesia problems Neg Hx   . Hypotension Neg Hx   . Malignant hyperthermia Neg Hx   . Pseudochol deficiency Neg Hx  Social History  Substance Use Topics  . Smoking status: Current Every Day Smoker -- 0.25 packs/day for 40 years    Types: Cigarettes  . Smokeless tobacco: Never Used     Comment: still smokng 1 or 2 a day  . Alcohol Use: No   OB History    No data available     Review of Systems  Constitutional: Positive for activity change and fatigue. Negative for fever.  HENT: Positive for congestion and rhinorrhea. Negative for sore throat.   Eyes: Negative for pain.  Respiratory: Positive for cough and shortness of breath. Negative for sputum production.   Cardiovascular: Positive for chest pain (episodic) and PND.  Gastrointestinal: Negative for abdominal pain.  Skin: Negative for rash.  All other systems reviewed and are negative.     Allergies  Acetaminophen; Codeine; Mirtazapine; and Morphine  Home Medications   Prior to Admission medications   Medication Sig Start Date End Date Taking? Authorizing Provider  albuterol (PROVENTIL,VENTOLIN) 90 MCG/ACT inhaler Inhale 1-2 puffs into the lungs every 6 (six) hours as needed for wheezing or shortness of breath.    Yes Historical Provider, MD  amoxicillin (AMOXIL) 500 MG capsule Take 500 mg by mouth daily. 7 day course ending 04-27-15 04/16/15  Yes Historical Provider,  MD  aspirin EC 81 MG tablet Take 81 mg by mouth daily.    Yes Historical Provider, MD  calcium acetate (PHOSLO) 667 MG capsule Take 667-1,334 mg by mouth 3 (three) times daily with meals. Pt reports taking 1 tab with breakfast and lunch and 2 tabs with dinner.  (but says she rarely eats breakfast or lunch)   Yes Historical Provider, MD  insulin aspart (NOVOLOG) 100 UNIT/ML injection Inject 1-2 Units into the skin 3 (three) times daily as needed for high blood sugar (CBG>150). CBG < 150 = no insulin CBG 150-200 = 1 unit CBG 200-250 = 2 units   Yes Historical Provider, MD  ipratropium-albuterol (DUONEB) 0.5-2.5 (3) MG/3ML SOLN Take 3 mLs by nebulization every 6 (six) hours as needed. J44.1 code Patient taking differently: Take 3 mLs by nebulization every 6 (six) hours as needed (shortness of breath, wheezing). J44.1 code 08/31/14  Yes Theodis Blaze, MD  levothyroxine (SYNTHROID, LEVOTHROID) 150 MCG tablet Take 150 mcg by mouth daily before breakfast.   Yes Historical Provider, MD  metoprolol tartrate (LOPRESSOR) 25 MG tablet Take 25 mg by mouth 2 (two) times daily.  03/16/14  Yes Historical Provider, MD  Multiple Vitamins-Minerals (MULTIVITAMIN WITH MINERALS) tablet Take 1 tablet by mouth daily.   Yes Historical Provider, MD  nitroGLYCERIN (NITROSTAT) 0.4 MG SL tablet Place 1 tablet (0.4 mg total) under the tongue every 5 (five) minutes as needed for chest pain. 03/20/14  Yes Albertine Patricia, MD  polyethylene glycol (MIRALAX / GLYCOLAX) packet Take 17 g by mouth daily as needed for mild constipation.    Yes Historical Provider, MD  SENSIPAR 60 MG tablet Take 60 mg by mouth daily.  03/04/15  Yes Historical Provider, MD  tacrolimus (PROGRAF) 1 MG capsule Take 3 mg by mouth 2 (two) times daily.    Yes Historical Provider, MD  traMADol (ULTRAM) 50 MG tablet Take 1 tablet (50 mg total) by mouth every 12 (twelve) hours as needed for moderate pain. 08/30/14  Yes Theodis Blaze, MD  verapamil (CALAN) 120 MG tablet  Take 1 tablet (120 mg total) by mouth at bedtime. NEED OV. 10/02/14  Yes Troy Sine, MD  warfarin (COUMADIN) 2.5  MG tablet Take 2.5 mg by mouth daily. Take 1 tablet on Tuesday, Thursday, and Saturday   Yes Historical Provider, MD  warfarin (COUMADIN) 4 MG tablet Take 1 tablet by mouth daily or as directed by coumadin clinic Patient taking differently: Take 4 mg by mouth daily at 6 PM. Take 1 tablet Sunday, Monday, Wednesday, and Friday 10/04/14  Yes Troy Sine, MD  azithromycin (ZITHROMAX) 250 MG tablet Take 1 tablet (250 mg total) by mouth daily. Take first 2 tablets together, then 1 every day until finished. Patient not taking: Reported on 04/21/2015 01/23/15   Quintella Reichert, MD   BP 151/65 mmHg  Pulse 89  Temp(Src) 97.5 F (36.4 C) (Oral)  Resp 25  Ht 5' 4.5" (1.638 m)  Wt 161 lb 9.6 oz (73.3 kg)  BMI 27.32 kg/m2  SpO2 100% Physical Exam  Constitutional: She is oriented to person, place, and time. She appears well-developed and well-nourished.  HENT:  Head: Normocephalic and atraumatic.  Neck: Normal range of motion.  Cardiovascular: Normal rate and regular rhythm.   Pulmonary/Chest: No stridor. No respiratory distress. She has rales.  Abdominal: She exhibits no distension. There is no tenderness. There is no rebound.  Musculoskeletal: Normal range of motion. She exhibits edema (mild LE, non pitting). She exhibits no tenderness.  Neurological: She is alert and oriented to person, place, and time.  Skin: Skin is warm and dry. No rash noted. No erythema.  Nursing note and vitals reviewed.   ED Course  Procedures (including critical care time) Labs Review Labs Reviewed  COMPREHENSIVE METABOLIC PANEL - Abnormal; Notable for the following:    Potassium 3.2 (*)    Chloride 98 (*)    Glucose, Bld 182 (*)    BUN 21 (*)    Creatinine, Ser 8.27 (*)    Albumin 3.4 (*)    ALT 9 (*)    GFR calc non Af Amer 5 (*)    GFR calc Af Amer 5 (*)    All other components within normal  limits  CBC WITH DIFFERENTIAL/PLATELET - Abnormal; Notable for the following:    RBC 2.97 (*)    Hemoglobin 9.8 (*)    HCT 30.3 (*)    MCV 102.0 (*)    RDW 16.2 (*)    Platelets 100 (*)    All other components within normal limits  TROPONIN I - Abnormal; Notable for the following:    Troponin I 0.06 (*)    All other components within normal limits  BRAIN NATRIURETIC PEPTIDE - Abnormal; Notable for the following:    B Natriuretic Peptide 2040.6 (*)    All other components within normal limits  BASIC METABOLIC PANEL - Abnormal; Notable for the following:    Potassium 3.0 (*)    Chloride 97 (*)    Glucose, Bld 194 (*)    BUN 22 (*)    Creatinine, Ser 8.62 (*)    GFR calc non Af Amer 4 (*)    GFR calc Af Amer 5 (*)    All other components within normal limits  HEPATITIS B SURFACE ANTIGEN    Imaging Review Dg Chest 2 View  04/21/2015  CLINICAL DATA:  Worsening shortness of breath.  Cough for 2 weeks. EXAM: CHEST - 2 VIEW COMPARISON:  Two-view chest x-ray 03/09/2015. FINDINGS: The heart is enlarged. Increasing interstitial edema is present. Bilateral pleural effusions are now present. Bibasilar airspace disease reflects atelectasis. The patient is status post median sternotomy for CABG and aortic valve  replacement. Atherosclerotic changes are again noted at the aortic arch. IMPRESSION: Progressive congestive heart failure. Electronically Signed   By: San Morelle M.D.   On: 04/21/2015 10:11   I have personally reviewed and evaluated these images and lab results as part of my medical decision-making.   EKG Interpretation   Date/Time:  Saturday April 21 2015 07:34:28 EST Ventricular Rate:  78 PR Interval:  172 QRS Duration: 98 QT Interval:  451 QTC Calculation: 514 R Axis:   36 Text Interpretation:  Sinus rhythm Anteroseptal infarct, old Abnormal T,  consider ischemia, lateral leads Prolonged QT interval similar to 01/23/15  Confirmed by [facility] MD, Corene Cornea [phone]) on  04/21/2015 8:11:59 AM      MDM   Final diagnoses:  Dyspnea   Fluid overload, less likely copd exacerbation. Episodic CP with it. Possibly pneumonia as she has had URI sx for last week as well.  Will start with tx for copd, reeval after xr for likely lasix/dialysis for fluid overload v abx/steroids for possible COPD.  Symptoms not improved with treatments. Labs and imaging c/w likely fluid overload. Will consult nephrology for assistance in gettng her to dialysis which should improve her symptoms enough for discharge.Merrily Pew, MD 04/21/15 [phone]

## 2015-04-21 NOTE — Procedures (Signed)
60 yo AAF hx HTN, COPD, DM2 and ESRD presenting with SOB and pulm edema. She is 2 kg over dry wt and has trying to lose weight for some time. OK for dc if stable after HD today.  Told her she may benefit from addidtional extra HD on Monday.  TTS patient.    I was present at this dialysis session, have reviewed the session itself and made  appropriate changes [name] MD [place] pager [phone]    cell [phone] 04/21/2015, 1:17 PM

## 2015-04-21 NOTE — Progress Notes (Signed)
Pt. completed 3.5 hrs of HD treatment. Net fluid removed, 3 liters. Post HD weight: 69.5 kg. Pt. Verbalized feeling, and breathing better. Awaiting to be picked up by daughter; to be discharged to home.

## 2015-04-22 LAB — HEPATITIS B SURFACE ANTIGEN: Hepatitis B Surface Ag: NEGATIVE

## 2015-04-23 DIAGNOSIS — J810 Acute pulmonary edema: Secondary | ICD-10-CM | POA: Diagnosis not present

## 2015-04-23 DIAGNOSIS — Z944 Liver transplant status: Secondary | ICD-10-CM | POA: Diagnosis not present

## 2015-04-23 DIAGNOSIS — E113293 Type 2 diabetes mellitus with mild nonproliferative diabetic retinopathy without macular edema, bilateral: Secondary | ICD-10-CM | POA: Diagnosis not present

## 2015-04-23 DIAGNOSIS — N186 End stage renal disease: Secondary | ICD-10-CM | POA: Diagnosis not present

## 2015-04-23 DIAGNOSIS — E8770 Fluid overload, unspecified: Secondary | ICD-10-CM | POA: Diagnosis not present

## 2015-04-23 DIAGNOSIS — I5023 Acute on chronic systolic (congestive) heart failure: Secondary | ICD-10-CM | POA: Diagnosis not present

## 2015-04-23 DIAGNOSIS — Z5181 Encounter for therapeutic drug level monitoring: Secondary | ICD-10-CM | POA: Diagnosis not present

## 2015-04-24 DIAGNOSIS — E118 Type 2 diabetes mellitus with unspecified complications: Secondary | ICD-10-CM | POA: Diagnosis not present

## 2015-04-24 DIAGNOSIS — N2581 Secondary hyperparathyroidism of renal origin: Secondary | ICD-10-CM | POA: Diagnosis not present

## 2015-04-24 DIAGNOSIS — N186 End stage renal disease: Secondary | ICD-10-CM | POA: Diagnosis not present

## 2015-04-24 DIAGNOSIS — Z23 Encounter for immunization: Secondary | ICD-10-CM | POA: Diagnosis not present

## 2015-04-24 DIAGNOSIS — I359 Nonrheumatic aortic valve disorder, unspecified: Secondary | ICD-10-CM | POA: Diagnosis not present

## 2015-04-24 DIAGNOSIS — D509 Iron deficiency anemia, unspecified: Secondary | ICD-10-CM | POA: Diagnosis not present

## 2015-04-24 LAB — PROTIME-INR
INR: 1.9 — AB (ref 0.9–1.1)
INR: 1.8 — AB (ref <=1.1)

## 2015-04-26 ENCOUNTER — Ambulatory Visit (INDEPENDENT_AMBULATORY_CARE_PROVIDER_SITE_OTHER): Payer: Medicare Other | Admitting: Pharmacist Clinician (PhC)/ Clinical Pharmacy Specialist

## 2015-04-26 ENCOUNTER — Other Ambulatory Visit: Payer: Self-pay | Admitting: Cardiovascular Disease

## 2015-04-26 DIAGNOSIS — Z954 Presence of other heart-valve replacement: Secondary | ICD-10-CM | POA: Diagnosis not present

## 2015-04-26 DIAGNOSIS — Z7901 Long term (current) use of anticoagulants: Secondary | ICD-10-CM

## 2015-04-26 DIAGNOSIS — N186 End stage renal disease: Secondary | ICD-10-CM | POA: Diagnosis not present

## 2015-04-26 DIAGNOSIS — Z23 Encounter for immunization: Secondary | ICD-10-CM | POA: Diagnosis not present

## 2015-04-26 DIAGNOSIS — D509 Iron deficiency anemia, unspecified: Secondary | ICD-10-CM | POA: Diagnosis not present

## 2015-04-26 DIAGNOSIS — E118 Type 2 diabetes mellitus with unspecified complications: Secondary | ICD-10-CM | POA: Diagnosis not present

## 2015-04-26 DIAGNOSIS — Z952 Presence of prosthetic heart valve: Secondary | ICD-10-CM

## 2015-04-26 DIAGNOSIS — N2581 Secondary hyperparathyroidism of renal origin: Secondary | ICD-10-CM | POA: Diagnosis not present

## 2015-04-26 NOTE — Telephone Encounter (Signed)
REFILL 

## 2015-04-28 ENCOUNTER — Other Ambulatory Visit: Payer: Self-pay | Admitting: Cardiovascular Disease

## 2015-04-28 DIAGNOSIS — Z23 Encounter for immunization: Secondary | ICD-10-CM | POA: Diagnosis not present

## 2015-04-28 DIAGNOSIS — E118 Type 2 diabetes mellitus with unspecified complications: Secondary | ICD-10-CM | POA: Diagnosis not present

## 2015-04-28 DIAGNOSIS — N2581 Secondary hyperparathyroidism of renal origin: Secondary | ICD-10-CM | POA: Diagnosis not present

## 2015-04-28 DIAGNOSIS — N186 End stage renal disease: Secondary | ICD-10-CM | POA: Diagnosis not present

## 2015-04-28 DIAGNOSIS — D509 Iron deficiency anemia, unspecified: Secondary | ICD-10-CM | POA: Diagnosis not present

## 2015-05-01 DIAGNOSIS — D509 Iron deficiency anemia, unspecified: Secondary | ICD-10-CM | POA: Diagnosis not present

## 2015-05-01 DIAGNOSIS — E118 Type 2 diabetes mellitus with unspecified complications: Secondary | ICD-10-CM | POA: Diagnosis not present

## 2015-05-01 DIAGNOSIS — I359 Nonrheumatic aortic valve disorder, unspecified: Secondary | ICD-10-CM | POA: Diagnosis not present

## 2015-05-01 DIAGNOSIS — Z23 Encounter for immunization: Secondary | ICD-10-CM | POA: Diagnosis not present

## 2015-05-01 DIAGNOSIS — N2581 Secondary hyperparathyroidism of renal origin: Secondary | ICD-10-CM | POA: Diagnosis not present

## 2015-05-01 DIAGNOSIS — N186 End stage renal disease: Secondary | ICD-10-CM | POA: Diagnosis not present

## 2015-05-01 LAB — PROTIME-INR: INR: 2.7 — AB (ref 0.9–1.1)

## 2015-05-02 ENCOUNTER — Emergency Department (HOSPITAL_COMMUNITY)
Admission: EM | Admit: 2015-05-02 | Discharge: 2015-05-02 | Disposition: A | Discharge status: Home / Self Care | Payer: Medicare Other | Attending: Emergency Medicine | Admitting: Emergency Medicine

## 2015-05-02 ENCOUNTER — Ambulatory Visit: Payer: Medicare Other | Admitting: Cardiology

## 2015-05-02 ENCOUNTER — Emergency Department (HOSPITAL_COMMUNITY): Payer: Medicare Other

## 2015-05-02 ENCOUNTER — Encounter (HOSPITAL_COMMUNITY): Payer: Self-pay

## 2015-05-02 DIAGNOSIS — K219 Gastro-esophageal reflux disease without esophagitis: Secondary | ICD-10-CM | POA: Insufficient documentation

## 2015-05-02 DIAGNOSIS — Z79899 Other long term (current) drug therapy: Secondary | ICD-10-CM | POA: Diagnosis not present

## 2015-05-02 DIAGNOSIS — R079 Chest pain, unspecified: Secondary | ICD-10-CM | POA: Diagnosis present

## 2015-05-02 DIAGNOSIS — Z992 Dependence on renal dialysis: Secondary | ICD-10-CM | POA: Diagnosis not present

## 2015-05-02 DIAGNOSIS — N186 End stage renal disease: Secondary | ICD-10-CM | POA: Insufficient documentation

## 2015-05-02 DIAGNOSIS — Z794 Long term (current) use of insulin: Secondary | ICD-10-CM | POA: Insufficient documentation

## 2015-05-02 DIAGNOSIS — Z944 Liver transplant status: Secondary | ICD-10-CM | POA: Insufficient documentation

## 2015-05-02 DIAGNOSIS — J069 Acute upper respiratory infection, unspecified: Secondary | ICD-10-CM | POA: Insufficient documentation

## 2015-05-02 DIAGNOSIS — F17210 Nicotine dependence, cigarettes, uncomplicated: Secondary | ICD-10-CM | POA: Insufficient documentation

## 2015-05-02 DIAGNOSIS — Z8619 Personal history of other infectious and parasitic diseases: Secondary | ICD-10-CM | POA: Insufficient documentation

## 2015-05-02 DIAGNOSIS — E669 Obesity, unspecified: Secondary | ICD-10-CM | POA: Insufficient documentation

## 2015-05-02 DIAGNOSIS — R42 Dizziness and giddiness: Secondary | ICD-10-CM

## 2015-05-02 DIAGNOSIS — E119 Type 2 diabetes mellitus without complications: Secondary | ICD-10-CM | POA: Insufficient documentation

## 2015-05-02 DIAGNOSIS — Z7982 Long term (current) use of aspirin: Secondary | ICD-10-CM | POA: Insufficient documentation

## 2015-05-02 DIAGNOSIS — R404 Transient alteration of awareness: Secondary | ICD-10-CM | POA: Diagnosis not present

## 2015-05-02 DIAGNOSIS — I2510 Atherosclerotic heart disease of native coronary artery without angina pectoris: Secondary | ICD-10-CM | POA: Diagnosis not present

## 2015-05-02 DIAGNOSIS — E039 Hypothyroidism, unspecified: Secondary | ICD-10-CM | POA: Insufficient documentation

## 2015-05-02 DIAGNOSIS — J449 Chronic obstructive pulmonary disease, unspecified: Secondary | ICD-10-CM | POA: Diagnosis not present

## 2015-05-02 DIAGNOSIS — R531 Weakness: Secondary | ICD-10-CM | POA: Diagnosis not present

## 2015-05-02 DIAGNOSIS — G8929 Other chronic pain: Secondary | ICD-10-CM | POA: Insufficient documentation

## 2015-05-02 DIAGNOSIS — Z8701 Personal history of pneumonia (recurrent): Secondary | ICD-10-CM | POA: Diagnosis not present

## 2015-05-02 DIAGNOSIS — I120 Hypertensive chronic kidney disease with stage 5 chronic kidney disease or end stage renal disease: Secondary | ICD-10-CM | POA: Diagnosis not present

## 2015-05-02 LAB — CBC
HEMATOCRIT: 31.3 % — AB (ref 36.0–46.0)
Hemoglobin: 9.7 g/dL — ABNORMAL LOW (ref 12.0–15.0)
MCH: 30.7 pg (ref 26.0–34.0)
MCHC: 31 g/dL (ref 30.0–36.0)
MCV: 99.1 fL (ref 78.0–100.0)
Platelets: 128 10*3/uL — ABNORMAL LOW (ref 150–400)
RBC: 3.16 MIL/uL — ABNORMAL LOW (ref 3.87–5.11)
RDW: 14.7 % (ref 11.5–15.5)
WBC: 5.8 10*3/uL (ref 4.0–10.5)

## 2015-05-02 LAB — CBG MONITORING, ED: Glucose-Capillary: 144 mg/dL — ABNORMAL HIGH (ref 65–99)

## 2015-05-02 LAB — BASIC METABOLIC PANEL
Anion gap: 8 (ref 5–15)
BUN: 5 mg/dL — AB (ref 6–20)
CHLORIDE: 95 mmol/L — AB (ref 101–111)
CO2: 32 mmol/L (ref 22–32)
Calcium: 8.5 mg/dL — ABNORMAL LOW (ref 8.9–10.3)
Creatinine, Ser: 5.06 mg/dL — ABNORMAL HIGH (ref 0.44–1.00)
GFR, EST AFRICAN AMERICAN: 10 mL/min — AB (ref >60)
GFR, EST NON AFRICAN AMERICAN: 8 mL/min — AB (ref >60)
GLUCOSE: 162 mg/dL — AB (ref 65–99)
Potassium: 3.6 mmol/L (ref 3.5–5.1)
Sodium: 135 mmol/L (ref 135–145)

## 2015-05-02 LAB — I-STAT TROPONIN, ED
Troponin i, poc: 0.03 ng/mL (ref 0.00–0.08)
Troponin i, poc: 0.02 ng/mL (ref 0.00–0.08)

## 2015-05-02 LAB — PROTIME-INR
INR: 3.02 — ABNORMAL HIGH (ref 0.00–1.49)
Prothrombin Time: 30.8 seconds — ABNORMAL HIGH (ref 11.6–15.2)

## 2015-05-02 MED ORDER — DIAZEPAM 5 MG PO TABS
5.0000 mg | ORAL_TABLET | Freq: Once | ORAL | Status: AC
  Administered 2015-05-02: 5 mg via ORAL
  Filled 2015-05-02: qty 1

## 2015-05-02 MED ORDER — DIAZEPAM 5 MG PO TABS: 5.0000 mg | ORAL_TABLET | Freq: Three times a day (TID) | ORAL | Status: DC | PRN

## 2015-05-02 MED ORDER — BENZONATATE 100 MG PO CAPS
100.0000 mg | ORAL_CAPSULE | Freq: Once | ORAL | Status: AC
  Administered 2015-05-02: 100 mg via ORAL
  Filled 2015-05-02: qty 1

## 2015-05-02 MED ORDER — BENZONATATE 100 MG PO CAPS: 100.0000 mg | ORAL_CAPSULE | Freq: Three times a day (TID) | ORAL | Status: DC | PRN

## 2015-05-02 MED ORDER — MECLIZINE HCL 25 MG PO TABS
25.0000 mg | ORAL_TABLET | Freq: Once | ORAL | Status: AC
  Administered 2015-05-02: 25 mg via ORAL
  Filled 2015-05-02: qty 1

## 2015-05-02 NOTE — ED Provider Notes (Signed)
Patient's care signed out to discharge if patient able to ambulate. Patient presented with multiple different symptoms including cough congestion and chest ache. Delta troponin negative. Patient developed dizziness similar to her previous vertigo. Patient given medicines and improved in the ER. Patient able to ambulate. Nephrology evaluated and comfortable with outpatient follow-up. Discussed follow-up with primary doctor in the next 48 hours. Labs Reviewed  BASIC METABOLIC PANEL - Abnormal; Notable for the following:    Chloride 95 (*)    Glucose, Bld 162 (*)    BUN 5 (*)    Creatinine, Ser 5.06 (*)    Calcium 8.5 (*)    GFR calc non Af Amer 8 (*)    GFR calc Af Amer 10 (*)    All other components within normal limits  CBC - Abnormal; Notable for the following:    RBC 3.16 (*)    Hemoglobin 9.7 (*)    HCT 31.3 (*)    Platelets 128 (*)    All other components within normal limits  PROTIME-INR - Abnormal; Notable for the following:    Prothrombin Time 30.8 (*)    INR 3.02 (*)    All other components within normal limits  CBG MONITORING, ED - Abnormal; Notable for the following:    Glucose-Capillary 144 (*)    All other components within normal limits  I-STAT TROPOININ, ED  Randolm Idol, ED     Upper respiratory infection  Vertigo     Elnora Morrison, MD 05/02/15 [phone]

## 2015-05-02 NOTE — ED Notes (Addendum)
Nephrology at bedside

## 2015-05-02 NOTE — ED Notes (Signed)
Attempted to ambulate patient. Patient stood twice and reported she felt too dizzy to ambulate. Will notify physician.

## 2015-05-02 NOTE — ED Notes (Signed)
Pt has been feeling sick for a week but chest pains started today.

## 2015-05-02 NOTE — ED Notes (Signed)
Pt ambulatory without assistance in room/hallway. Pt reports cough relieved by tessalon. Pt denies dizziness while ambulating.

## 2015-05-02 NOTE — ED Provider Notes (Signed)
CSN: [id number]     Arrival date & time 05/02/15  0224 History   By signing my name below, I, Forrestine Him, attest that this documentation has been prepared under the direction and in the presence of Merryl Hacker, MD.  Electronically Signed: Forrestine Him, ED Scribe. 05/02/2015. 2:52 AM.   Chief Complaint  Patient presents with  . Chest Pain   The history is provided by the patient. No language interpreter was used.    HPI Comments: [name] is a 60 y.o. female with a PMHx of CAD, PVD, HTN, DM, ESRD, CAD, vertigo, and aortic stenosis who presents to the Emergency Department complaining of intermittent, ongoing, worsening dizziness x 1-2 days. Dizziness is described as the room spinning. Pt also reports ongoing cough, nausea, and intermittent chest pain brought on by coughing. No aggravating or alleviating factors reported. Pt admits to a history of Vertigo and states symptoms feel similar to previous Vertigo episodes. No OTC medications or home remedies attempted prior to arrival. No recent fever, chills, leg swelling, or vomiting. Pt typically dialyzes on Tuesdays, Thursdays, and Saturdays.  PCP: [name], MD    Past Medical History  Diagnosis Date  . 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 Chronic back pain   . CAD (coronary artery disease)   . Obesity   . Peripheral vascular disease (HCC) hands and legs  . Anxiety   . Asthma   . GERD (gastroesophageal reflux disease)     takes Omeprazole daily  . Chronic constipation     takes MIralax and Colace daily  . Anemia     takes Folic Acid daily  . Hypothyroidism     takes Synthroid daily  . Depression     takes Cymbalta for "severe" depression  . Neuromuscular disorder ([place])     carpal tunnel in right hand  . Hypertension     takes Metoprolol and Lisinopril daily, sees Dr Bea Graff  . COPD (chronic obstructive pulmonary disease) ([place])    . Pneumonia     "today and several times before" (08/30/2012)  . Chronic bronchitis ([place])     "q yr w/season changes" (08/30/2012)  . Type II diabetes mellitus (HCC)     Levemir 2units daily if > 150  . History of blood transfusion     "several" (08/30/2012)  . Hepatitis C   . Migraine     "last migraine was in 2013" (08/30/2012)  . Headache     "at least monthly" (08/30/2012)  . Arthritis     "left hand, back" (08/30/2012)  . End stage renal disease on dialysis [facility]) 02/27/2011    Kidneys shut down at time of liver transplant in Sept 2011 at [facility] in [place], she has been on HD ever since.  Dialyzes at [facility] HD on TTS schedule.  Had L forearm graft used 10 months then removed Dec 2012 due to suspected infection.  A right upper arm AVG was placed Dec 2012 but she developed steal symptoms acutely and it was ligated the same day.  Never had an AV fistula due to small veins.  Now has L thigh AVG put in Jan 2013, has not clotted to date.    [name] CAD (coronary artery disease) Jan. 2015    Cath: 20% LAD, 50% D1; s/p LIMA-LAD  . Aortic stenosis   . Tobacco abuse   .  S/P aortic valve replacement 03/15/13    Mechanical   . SVT (supraventricular tachycardia) ([place]) 06/09/14   Past Surgical History  Procedure Laterality Date  . Liver transplant  10/25/2009    sees Dr Ferol Luz 1 every 6 months, saw last in Dec 2013. [name] Coord [phone]  . Small intestine surgery  90's  . Thrombectomy    . Arteriovenous graft placement Left 10/03/10     forearm  . Avgg removal  12/23/2010    Procedure: REMOVAL OF ARTERIOVENOUS GORETEX GRAFT ([place]);  Surgeon: Elam Dutch, MD;  Location: [place];  Service: Vascular;  Laterality: Left;  procedure started @1736 -1852  . Insertion of dialysis catheter  12/23/2010    Procedure: INSERTION OF DIALYSIS CATHETER;  Surgeon: Elam Dutch, MD;  Location: [place];  Service: Vascular;  Laterality: Right;  Right Internal Jugular 28cm dialysis catheter insertion  procedure time 1701-1720   . Cholecystectomy  1993  . Cystoscopy  1990's  . Spinal growth rods  2010    "put 2 metal rods in my back; they had detetriorated" (08/30/2012)  . Av fistula placement  01/29/2011    Procedure: INSERTION OF ARTERIOVENOUS (AV) GORE-TEX GRAFT ARM;  Surgeon: Elam Dutch, MD;  Location: [place];  Service: Vascular;  Laterality: Right;  . Av fistula placement  03/10/2011    Procedure: INSERTION OF ARTERIOVENOUS (AV) GORE-TEX GRAFT THIGH;  Surgeon: Elam Dutch, MD;  Location: [place];  Service: Vascular;  Laterality: Left;  . Tubal ligation  1990's  . Aortic valve replacement N/A 03/15/2013    AVR; Surgeon: Ivin Poot, MD;  Location: [facility] OR; Open Heart Surgery;  6mm[employer] mechanical prosthesis, top hat valve  . Coronary artery bypass graft N/A 03/15/2013    Procedure: CORONARY ARTERY BYPASS GRAFTING (CABG) times one using left internal mammary artery.;  Surgeon: Ivin Poot, MD;  Location: [place];  Service: Open Heart Surgery;  Laterality: N/A;  POSS CABG X 1  . Intraoperative transesophageal echocardiogram N/A 03/15/2013    Procedure: INTRAOPERATIVE TRANSESOPHAGEAL ECHOCARDIOGRAM;  Surgeon: Ivin Poot, MD;  Location: [place];  Service: Open Heart Surgery;  Laterality: N/A;  . Left heart catheterization with coronary/graft angiogram N/A 12/24/2011    Procedure: LEFT HEART CATHETERIZATION WITH Beatrix Fetters;  Surgeon: [name], MD;  Location[facility] CATH LAB;  Service: Cardiovascular;  Laterality: N/A;  . Left heart catheterization with coronary angiogram N/A 07/29/2012    Procedure: LEFT HEART CATHETERIZATION WITH CORONARY ANGIOGRAM;  Surgeon: Troy Sine, MD;  Location: [facility] CATH LAB;  Service: Cardiovascular;  Laterality: N/A;  . Left heart catheterization with coronary angiogram N/A 03/10/2013    Procedure: LEFT HEART CATHETERIZATION WITH CORONARY ANGIOGRAM;  Surgeon: Troy Sine, MD;  Location: [facility] CATH LAB;  Service: Cardiovascular;   Laterality: N/A;  . Left heart catheterization with coronary/graft angiogram N/A 12/16/2013    Procedure: LEFT HEART CATHETERIZATION WITH Beatrix Fetters;  Surgeon: Troy Sine, MD;  Location: [facility] CATH LAB;  Service: Cardiovascular;  Laterality: N/A;  . Thrombectomy and revision of arterioventous (av) goretex  graft Left 03/30/2014  . Thrombectomy and revision of arterioventous (av) goretex  graft Left 03/30/2014    Procedure: THROMBECTOMY AND REVISION OF ARTERIOVENTOUS (AV) GORETEX  GRAFT;  Surgeon: Conrad Roy, MD;  Location: [place];  Service: Vascular;  Laterality: Left;  . Shuntogram Left 05/15/2014    Procedure: SHUNTOGRAM;  Surgeon: Conrad Tulia, MD;  Location: [facility] CATH LAB;  Service: Cardiovascular;  Laterality:  Left;   Family History  Problem Relation Age of Onset  . Cancer Mother   . Diabetes Mother   . Hypertension Mother   . Stroke Mother   . Cancer Father   . Anesthesia problems Neg Hx   . Hypotension Neg Hx   . Malignant hyperthermia Neg Hx   . Pseudochol deficiency Neg Hx    Social History  Substance Use Topics  . Smoking status: Current Every Day Smoker -- 0.25 packs/day for 40 years    Types: Cigarettes  . Smokeless tobacco: Never Used     Comment: still smokng 1 or 2 a day  . Alcohol Use: No   OB History    No data available     Review of Systems  Constitutional: Negative for fever and chills.  Respiratory: Positive for cough.   Cardiovascular: Positive for chest pain.  Gastrointestinal: Positive for nausea. Negative for vomiting and abdominal pain.  Neurological: Positive for dizziness. Negative for headaches.  Psychiatric/Behavioral: Negative for confusion.  All other systems reviewed and are negative.     Allergies  Acetaminophen; Codeine; Mirtazapine; and Morphine  Home Medications   Prior to Admission medications   Medication Sig Start Date End Date Taking? Authorizing Provider  albuterol (PROVENTIL,VENTOLIN) 90 MCG/ACT inhaler Inhale 1-2  puffs into the lungs every 6 (six) hours as needed for wheezing or shortness of breath.    Yes Historical Provider, MD  aspirin EC 81 MG tablet Take 81 mg by mouth daily.    Yes Historical Provider, MD  calcium acetate (PHOSLO) 667 MG capsule Take 667-1,334 mg by mouth 3 (three) times daily with meals. Pt reports taking 1 tab with breakfast and lunch and 2 tabs with dinner.  (but says she rarely eats breakfast or lunch)   Yes Historical Provider, MD  insulin aspart (NOVOLOG) 100 UNIT/ML injection Inject 1-2 Units into the skin 3 (three) times daily as needed for high blood sugar (CBG>150). CBG < 150 = no insulin CBG 150-200 = 1 unit CBG 200-250 = 2 units   Yes Historical Provider, MD  ipratropium-albuterol (DUONEB) 0.5-2.5 (3) MG/3ML SOLN Take 3 mLs by nebulization every 6 (six) hours as needed. J44.1 code Patient taking differently: Take 3 mLs by nebulization every 6 (six) hours as needed (shortness of breath, wheezing). J44.1 code 08/31/14  Yes Theodis Blaze, MD  levothyroxine (SYNTHROID, LEVOTHROID) 150 MCG tablet Take 150 mcg by mouth daily before breakfast.   Yes Historical Provider, MD  metoprolol tartrate (LOPRESSOR) 25 MG tablet Take 25 mg by mouth 2 (two) times daily.  03/16/14  Yes Historical Provider, MD  Multiple Vitamins-Minerals (MULTIVITAMIN WITH MINERALS) tablet Take 1 tablet by mouth daily.   Yes Historical Provider, MD  nitroGLYCERIN (NITROSTAT) 0.4 MG SL tablet Place 1 tablet (0.4 mg total) under the tongue every 5 (five) minutes as needed for chest pain. 03/20/14  Yes Albertine Patricia, MD  polyethylene glycol (MIRALAX / GLYCOLAX) packet Take 17 g by mouth daily as needed for mild constipation.    Yes Historical Provider, MD  SENSIPAR 60 MG tablet Take 60 mg by mouth daily.  03/04/15  Yes Historical Provider, MD  tacrolimus (PROGRAF) 1 MG capsule Take 3 mg by mouth 2 (two) times daily.    Yes Historical Provider, MD  traMADol (ULTRAM) 50 MG tablet Take 1 tablet (50 mg total) by mouth  every 12 (twelve) hours as needed for moderate pain. 08/30/14  Yes Theodis Blaze, MD  verapamil (CALAN) 120 MG tablet  TAKE 1 TABLET (120 MG TOTAL) BY MOUTH AT BEDTIME. NEED OV. 04/26/15  Yes Troy Sine, MD  warfarin (COUMADIN) 4 MG tablet Take 2-4 mg by mouth daily. 4mg  Monday Wednesday Friday, 2mg  all other days   Yes Historical Provider, MD  diazepam (VALIUM) 5 MG tablet Take 1 tablet (5 mg total) by mouth every 8 (eight) hours as needed (vertigo). 05/02/15   Merryl Hacker, MD   Triage Vitals: BP 146/55 mmHg  Pulse 68  Temp(Src) 97.9 F (36.6 C) (Oral)  Resp 6  Ht 5' 4.5" (1.638 m)  Wt 160 lb (72.576 kg)  BMI 27.05 kg/m2  SpO2 91%   Physical Exam  Constitutional: She is oriented to person, place, and time. No distress.  HENT:  Head: Normocephalic and atraumatic.  Mucous membranes dry  Eyes: EOM are normal. Pupils are equal, round, and reactive to light.  Cardiovascular: Normal rate, regular rhythm and normal heart sounds.   No murmur heard. Pulmonary/Chest: Effort normal and breath sounds normal. No respiratory distress. She has no wheezes.  Abdominal: Soft. Bowel sounds are normal. There is no tenderness. There is no rebound.  Musculoskeletal: She exhibits no edema.  Dialysis graft left thigh  Neurological: She is alert and oriented to person, place, and time.  Skin: Skin is warm and dry.  Nursing note and vitals reviewed.   ED Course  Procedures (including critical care time)  DIAGNOSTIC STUDIES: Oxygen Saturation is 98% on RA, Normal by my interpretation.    COORDINATION OF CARE: 2:45 AM- Will order CXR, PT-INR, BMP, CBC, I-stat troponin, and EKG. Discussed treatment plan with pt at bedside and pt agreed to plan.     Labs Review Labs Reviewed  BASIC METABOLIC PANEL - Abnormal; Notable for the following:    Chloride 95 (*)    Glucose, Bld 162 (*)    BUN 5 (*)    Creatinine, Ser 5.06 (*)    Calcium 8.5 (*)    GFR calc non Af Amer 8 (*)    GFR calc Af Amer 10  (*)    All other components within normal limits  CBC - Abnormal; Notable for the following:    RBC 3.16 (*)    Hemoglobin 9.7 (*)    HCT 31.3 (*)    Platelets 128 (*)    All other components within normal limits  PROTIME-INR - Abnormal; Notable for the following:    Prothrombin Time 30.8 (*)    INR 3.02 (*)    All other components within normal limits  CBG MONITORING, ED - Abnormal; Notable for the following:    Glucose-Capillary 144 (*)    All other components within normal limits  I-STAT TROPOININ, ED  Randolm Idol, ED    Imaging Review Dg Chest 2 View  05/02/2015  CLINICAL DATA:  60 year old female with chest pain . EXAM: CHEST  2 VIEW COMPARISON:  Radiograph dated 04/21/2015 FINDINGS: Two views of the chest do not demonstrate a focal consolidation. There is no pleural effusion or pneumothorax. There is increased AP diameter likely representing underlying obstructive lung disease. The cardiac silhouette is within normal limits. Median sternotomy wires and mechanical cardiac valve noted. Multiple surgical clips noted in the upper abdomen. No acute osseous pathology identified. IMPRESSION: No active cardiopulmonary disease. Electronically Signed   By: Anner Crete M.D.   On: 05/02/2015 03:42   I have personally reviewed and evaluated these images and lab results as part of my medical decision-making.   EKG Interpretation  Date/Time:  Wednesday May 02 2015 02:35:45 EDT Ventricular Rate:  74 PR Interval:  214 QRS Duration: 100 QT Interval:  458 QTC Calculation: 508 R Axis:   -26 Text Interpretation:  Sinus rhythm Prolonged PR interval LVH with  secondary repolarization abnormality Anterior Q waves, possibly due to LVH  Similar to prior ekgs Confirmed by Tiphany Fayson  MD, Talis Iwan ([id number]) on  05/02/2015 2:42:42 AM      MDM   Final diagnoses:  Upper respiratory infection  Vertigo   Patient presents with cough generalized weakness,. She also reports chest tightness is  worse with coughing. History of vertigo and reports from spinning dizziness. Nontoxic on exam. No focal deficits noted. No signs of cell or patellar dysfunction. Basic labwork obtained lab work is largely reassuring including troponin. Feel patient's chest discomfort is likely related to upper respiratory infection. No signs of pneumonia on chest x-ray.  4:33 AM No ongoing chest pain. Does report persistent vertigo. Patient given Valium.  6:30 AM Patient reports improvement of vertiginous symptoms. However, upon standing to ambulate prior to discharge, she reports return of vertigo symptoms. Patient was given Valium. Will monitor for improvement prior to d/c.  I personally performed the services described in this documentation, which was scribed in my presence. The recorded information has been reviewed and is accurate.   Merryl Hacker, MD 05/02/15 [phone]

## 2015-05-02 NOTE — ED Notes (Signed)
Dr Zavitz at bedside  

## 2015-05-02 NOTE — ED Notes (Signed)
Pt verbalized understanding of d/c instructionsand follow-up care. No further questions/concerns, VSS, assisted to lobby in wheelchair. 

## 2015-05-02 NOTE — ED Notes (Signed)
CP 10/10 [facility] substernal no radiation. C/o for sickness, coughing, sputum color was yellow now white.  2 EKGs completed. No chest pain now. Just feels puny Hep C positive 2 deactivated shunts in both arms. Dialysis through left thigh. No IV access.  240 CBG 70s 98% RA 18R

## 2015-05-02 NOTE — Discharge Instructions (Signed)
Benign Positional Vertigo °Vertigo is the feeling that you or your surroundings are moving when they are not. Benign positional vertigo is the most common form of vertigo. The cause of this condition is not serious (is benign). This condition is triggered by certain movements and positions (is positional). This condition can be dangerous if it occurs while you are doing something that could endanger you or others, such as driving.  °CAUSES °In many cases, the cause of this condition is not known. It may be caused by a disturbance in an area of the inner ear that helps your brain to sense movement and balance. This disturbance can be caused by a viral infection (labyrinthitis), head injury, or repetitive motion. °RISK FACTORS °This condition is more likely to develop in: °· Women. °· People who are 50 years of age or older. °SYMPTOMS °Symptoms of this condition usually happen when you move your head or your eyes in different directions. Symptoms may start suddenly, and they usually last for less than a minute. Symptoms may include: °· Loss of balance and falling. °· Feeling like you are spinning or moving. °· Feeling like your surroundings are spinning or moving. °· Nausea and vomiting. °· Blurred vision. °· Dizziness. °· Involuntary eye movement (nystagmus). °Symptoms can be mild and cause only slight annoyance, or they can be severe and interfere with daily life. Episodes of benign positional vertigo may return (recur) over time, and they may be triggered by certain movements. Symptoms may improve over time. °DIAGNOSIS °This condition is usually diagnosed by medical history and a physical exam of the head, neck, and ears. You may be referred to a health care provider who specializes in ear, nose, and throat (ENT) problems (otolaryngologist) or a provider who specializes in disorders of the nervous system (neurologist). You may have additional testing, including: °· MRI. °· A CT scan. °· Eye movement tests. Your  health care provider may ask you to change positions quickly while he or she watches you for symptoms of benign positional vertigo, such as nystagmus. Eye movement may be tested with an electronystagmogram (ENG), caloric stimulation, the Dix-Hallpike test, or the roll test. °· An electroencephalogram (EEG). This records electrical activity in your brain. °· Hearing tests. °TREATMENT °Usually, your health care provider will treat this by moving your head in specific positions to adjust your inner ear back to normal. Surgery may be needed in severe cases, but this is rare. In some cases, benign positional vertigo may resolve on its own in 2-4 weeks. °HOME CARE INSTRUCTIONS °Safety °· Move slowly. Avoid sudden body or head movements. °· Avoid driving. °· Avoid operating heavy machinery. °· Avoid doing any tasks that would be dangerous to you or others if a vertigo episode would occur. °· If you have trouble walking or keeping your balance, try using a cane for stability. If you feel dizzy or unstable, sit down right away. °· Return to your normal activities as told by your health care provider. Ask your health care provider what activities are safe for you. °General Instructions °· Take over-the-counter and prescription medicines only as told by your health care provider. °· Avoid certain positions or movements as told by your health care provider. °· Drink enough fluid to keep your urine clear or pale yellow. °· Keep all follow-up visits as told by your health care provider. This is important. °SEEK MEDICAL CARE IF: °· You have a fever. °· Your condition gets worse or you develop new symptoms. °· Your family or friends   notice any behavioral changes. °· Your nausea or vomiting gets worse. °· You have numbness or a "pins and needles" sensation. °SEEK IMMEDIATE MEDICAL CARE IF: °· You have difficulty speaking or moving. °· You are always dizzy. °· You faint. °· You develop severe headaches. °· You have weakness in your  legs or arms. °· You have changes in your hearing or vision. °· You develop a stiff neck. °· You develop sensitivity to light. °  °This information is not intended to replace advice given to you by your health care provider. Make sure you discuss any questions you have with your health care provider. °  °Document Released: 11/04/2005 Document Revised: 10/18/2014 Document Reviewed: 05/22/2014 °[employer] Interactive Patient Education ©2016 [employer] °Upper Respiratory Infection, Adult °Most upper respiratory infections (URIs) are a viral infection of the air passages leading to the lungs. A URI affects the nose, throat, and upper air passages. The most common type of URI is nasopharyngitis and is typically referred to as "the common cold." °URIs run their course and usually go away on their own. Most of the time, a URI does not require medical attention, but sometimes a bacterial infection in the upper airways can follow a viral infection. This is called a secondary infection. Sinus and middle ear infections are common types of secondary upper respiratory infections. °Bacterial pneumonia can also complicate a URI. A URI can worsen asthma and chronic obstructive pulmonary disease (COPD). Sometimes, these complications can require emergency medical care and may be life threatening.  °CAUSES °Almost all URIs are caused by viruses. A virus is a type of germ and can spread from one person to another.  °RISKS FACTORS °You may be at risk for a URI if:  °· You smoke.   °· You have chronic heart or lung disease. °· You have a weakened defense (immune) system.   °· You are very young or very old.   °· You have nasal allergies or asthma. °· You work in crowded or poorly ventilated areas. °· You work in health care facilities or schools. °SIGNS AND SYMPTOMS  °Symptoms typically develop 2-3 days after you come in contact with a cold virus. Most viral URIs last 7-10 days. However, viral URIs from the influenza virus (flu virus)  can last 14-18 days and are typically more severe. Symptoms may include:  °· Runny or stuffy (congested) nose.   °· Sneezing.   °· Cough.   °· Sore throat.   °· Headache.   °· Fatigue.   °· Fever.   °· Loss of appetite.   °· Pain in your forehead, behind your eyes, and over your cheekbones (sinus pain). °· Muscle aches.   °DIAGNOSIS  °Your health care provider may diagnose a URI by: °· Physical exam. °· Tests to check that your symptoms are not due to another condition such as: °¨ Strep throat. °¨ Sinusitis. °¨ Pneumonia. °¨ Asthma. °TREATMENT  °A URI goes away on its own with time. It cannot be cured with medicines, but medicines may be prescribed or recommended to relieve symptoms. Medicines may help: °· Reduce your fever. °· Reduce your cough. °· Relieve nasal congestion. °HOME CARE INSTRUCTIONS  °· Take medicines only as directed by your health care provider.   °· Gargle warm saltwater or take cough drops to comfort your throat as directed by your health care provider. °· Use a warm mist humidifier or inhale steam from a shower to increase air moisture. This may make it easier to breathe. °· Drink enough fluid to keep your urine clear or pale yellow.   °·   Eat soups and other clear broths and maintain good nutrition.   °· Rest as needed.   °· Return to work when your temperature has returned to normal or as your health care provider advises. You may need to stay home longer to avoid infecting others. You can also use a face mask and careful hand washing to prevent spread of the virus. °· Increase the usage of your inhaler if you have asthma.   °· Do not use any tobacco products, including cigarettes, chewing tobacco, or electronic cigarettes. If you need help quitting, ask your health care provider. °PREVENTION  °The best way to protect yourself from getting a cold is to practice good hygiene.  °· Avoid oral or hand contact with people with cold symptoms.   °· Wash your hands often if contact occurs.   °There is  no clear evidence that vitamin C, vitamin E, echinacea, or exercise reduces the chance of developing a cold. However, it is always recommended to get plenty of rest, exercise, and practice good nutrition.  °SEEK MEDICAL CARE IF:  °· You are getting worse rather than better.   °· Your symptoms are not controlled by medicine.   °· You have chills. °· You have worsening shortness of breath. °· You have brown or red mucus. °· You have yellow or brown nasal discharge. °· You have pain in your face, especially when you bend forward. °· You have a fever. °· You have swollen neck glands. °· You have pain while swallowing. °· You have white areas in the back of your throat. °SEEK IMMEDIATE MEDICAL CARE IF:  °· You have severe or persistent: °¨ Headache. °¨ Ear pain. °¨ Sinus pain. °¨ Chest pain. °· You have chronic lung disease and any of the following: °¨ Wheezing. °¨ Prolonged cough. °¨ Coughing up blood. °¨ A change in your usual mucus. °· You have a stiff neck. °· You have changes in your: °¨ Vision. °¨ Hearing. °¨ Thinking. °¨ Mood. °MAKE SURE YOU:  °· Understand these instructions. °· Will watch your condition. °· Will get help right away if you are not doing well or get worse. °  °This information is not intended to replace advice given to you by your health care provider. Make sure you discuss any questions you have with your health care provider. °  °Document Released: 07/23/2000 Document Revised: 06/13/2014 Document Reviewed: 05/04/2013 °[employer] Interactive Patient Education ©2016 [employer] ° °

## 2015-05-03 ENCOUNTER — Ambulatory Visit (INDEPENDENT_AMBULATORY_CARE_PROVIDER_SITE_OTHER): Payer: Medicare Other | Admitting: Pharmacist Clinician (PhC)/ Clinical Pharmacy Specialist

## 2015-05-03 DIAGNOSIS — Z7901 Long term (current) use of anticoagulants: Secondary | ICD-10-CM

## 2015-05-03 DIAGNOSIS — Z952 Presence of prosthetic heart valve: Secondary | ICD-10-CM

## 2015-05-03 DIAGNOSIS — N186 End stage renal disease: Secondary | ICD-10-CM | POA: Diagnosis not present

## 2015-05-03 DIAGNOSIS — I359 Nonrheumatic aortic valve disorder, unspecified: Secondary | ICD-10-CM | POA: Diagnosis not present

## 2015-05-03 DIAGNOSIS — Z23 Encounter for immunization: Secondary | ICD-10-CM | POA: Diagnosis not present

## 2015-05-03 DIAGNOSIS — D509 Iron deficiency anemia, unspecified: Secondary | ICD-10-CM | POA: Diagnosis not present

## 2015-05-03 DIAGNOSIS — E118 Type 2 diabetes mellitus with unspecified complications: Secondary | ICD-10-CM | POA: Diagnosis not present

## 2015-05-03 DIAGNOSIS — Z954 Presence of other heart-valve replacement: Secondary | ICD-10-CM

## 2015-05-03 DIAGNOSIS — N2581 Secondary hyperparathyroidism of renal origin: Secondary | ICD-10-CM | POA: Diagnosis not present

## 2015-05-03 LAB — PROTIME-INR: INR: 3.4 — AB (ref 0.9–1.1)

## 2015-05-04 DIAGNOSIS — D509 Iron deficiency anemia, unspecified: Secondary | ICD-10-CM | POA: Diagnosis not present

## 2015-05-04 DIAGNOSIS — Z23 Encounter for immunization: Secondary | ICD-10-CM | POA: Diagnosis not present

## 2015-05-04 DIAGNOSIS — N2581 Secondary hyperparathyroidism of renal origin: Secondary | ICD-10-CM | POA: Diagnosis not present

## 2015-05-04 DIAGNOSIS — J40 Bronchitis, not specified as acute or chronic: Secondary | ICD-10-CM | POA: Diagnosis not present

## 2015-05-04 DIAGNOSIS — N186 End stage renal disease: Secondary | ICD-10-CM | POA: Diagnosis not present

## 2015-05-04 DIAGNOSIS — E118 Type 2 diabetes mellitus with unspecified complications: Secondary | ICD-10-CM | POA: Diagnosis not present

## 2015-05-05 DIAGNOSIS — R002 Palpitations: Secondary | ICD-10-CM | POA: Diagnosis not present

## 2015-05-05 DIAGNOSIS — T8203XA Leakage of heart valve prosthesis, initial encounter: Secondary | ICD-10-CM | POA: Diagnosis not present

## 2015-05-05 DIAGNOSIS — I358 Other nonrheumatic aortic valve disorders: Secondary | ICD-10-CM | POA: Diagnosis not present

## 2015-05-05 DIAGNOSIS — I38 Endocarditis, valve unspecified: Secondary | ICD-10-CM | POA: Diagnosis not present

## 2015-05-05 DIAGNOSIS — I132 Hypertensive heart and chronic kidney disease with heart failure and with stage 5 chronic kidney disease, or end stage renal disease: Secondary | ICD-10-CM | POA: Diagnosis present

## 2015-05-05 DIAGNOSIS — I722 Aneurysm of renal artery: Secondary | ICD-10-CM | POA: Diagnosis present

## 2015-05-05 DIAGNOSIS — I5022 Chronic systolic (congestive) heart failure: Secondary | ICD-10-CM | POA: Diagnosis not present

## 2015-05-05 DIAGNOSIS — I5033 Acute on chronic diastolic (congestive) heart failure: Secondary | ICD-10-CM | POA: Diagnosis present

## 2015-05-05 DIAGNOSIS — N186 End stage renal disease: Secondary | ICD-10-CM | POA: Diagnosis present

## 2015-05-05 DIAGNOSIS — I700 Atherosclerosis of aorta: Secondary | ICD-10-CM | POA: Diagnosis not present

## 2015-05-05 DIAGNOSIS — Z720 Tobacco use: Secondary | ICD-10-CM | POA: Diagnosis not present

## 2015-05-05 DIAGNOSIS — D649 Anemia, unspecified: Secondary | ICD-10-CM | POA: Diagnosis not present

## 2015-05-05 DIAGNOSIS — R112 Nausea with vomiting, unspecified: Secondary | ICD-10-CM | POA: Diagnosis not present

## 2015-05-05 DIAGNOSIS — Z951 Presence of aortocoronary bypass graft: Secondary | ICD-10-CM | POA: Diagnosis not present

## 2015-05-05 DIAGNOSIS — R197 Diarrhea, unspecified: Secondary | ICD-10-CM | POA: Diagnosis not present

## 2015-05-05 DIAGNOSIS — T8640 Unspecified complication of liver transplant: Secondary | ICD-10-CM | POA: Diagnosis not present

## 2015-05-05 DIAGNOSIS — R791 Abnormal coagulation profile: Secondary | ICD-10-CM | POA: Diagnosis not present

## 2015-05-05 DIAGNOSIS — J449 Chronic obstructive pulmonary disease, unspecified: Secondary | ICD-10-CM | POA: Diagnosis present

## 2015-05-05 DIAGNOSIS — T827XXA Infection and inflammatory reaction due to other cardiac and vascular devices, implants and grafts, initial encounter: Secondary | ICD-10-CM | POA: Diagnosis not present

## 2015-05-05 DIAGNOSIS — I4581 Long QT syndrome: Secondary | ICD-10-CM | POA: Diagnosis not present

## 2015-05-05 DIAGNOSIS — D62 Acute posthemorrhagic anemia: Secondary | ICD-10-CM | POA: Diagnosis present

## 2015-05-05 DIAGNOSIS — T82867A Thrombosis of cardiac prosthetic devices, implants and grafts, initial encounter: Secondary | ICD-10-CM | POA: Diagnosis not present

## 2015-05-05 DIAGNOSIS — I517 Cardiomegaly: Secondary | ICD-10-CM | POA: Diagnosis not present

## 2015-05-05 DIAGNOSIS — Z992 Dependence on renal dialysis: Secondary | ICD-10-CM | POA: Diagnosis not present

## 2015-05-05 DIAGNOSIS — S37012D Minor contusion of left kidney, subsequent encounter: Secondary | ICD-10-CM | POA: Diagnosis not present

## 2015-05-05 DIAGNOSIS — R000 Tachycardia, unspecified: Secondary | ICD-10-CM | POA: Diagnosis not present

## 2015-05-05 DIAGNOSIS — E039 Hypothyroidism, unspecified: Secondary | ICD-10-CM | POA: Diagnosis not present

## 2015-05-05 DIAGNOSIS — Z5181 Encounter for therapeutic drug level monitoring: Secondary | ICD-10-CM | POA: Diagnosis not present

## 2015-05-05 DIAGNOSIS — K661 Hemoperitoneum: Secondary | ICD-10-CM | POA: Diagnosis present

## 2015-05-05 DIAGNOSIS — N281 Cyst of kidney, acquired: Secondary | ICD-10-CM | POA: Diagnosis not present

## 2015-05-05 DIAGNOSIS — R109 Unspecified abdominal pain: Secondary | ICD-10-CM | POA: Diagnosis not present

## 2015-05-05 DIAGNOSIS — I509 Heart failure, unspecified: Secondary | ICD-10-CM | POA: Diagnosis not present

## 2015-05-05 DIAGNOSIS — Z95828 Presence of other vascular implants and grafts: Secondary | ICD-10-CM | POA: Diagnosis not present

## 2015-05-05 DIAGNOSIS — I080 Rheumatic disorders of both mitral and aortic valves: Secondary | ICD-10-CM | POA: Diagnosis not present

## 2015-05-05 DIAGNOSIS — R58 Hemorrhage, not elsewhere classified: Secondary | ICD-10-CM | POA: Diagnosis not present

## 2015-05-05 DIAGNOSIS — R0989 Other specified symptoms and signs involving the circulatory and respiratory systems: Secondary | ICD-10-CM | POA: Diagnosis not present

## 2015-05-05 DIAGNOSIS — N2889 Other specified disorders of kidney and ureter: Secondary | ICD-10-CM | POA: Diagnosis not present

## 2015-05-05 DIAGNOSIS — S37021A Major contusion of right kidney, initial encounter: Secondary | ICD-10-CM | POA: Diagnosis not present

## 2015-05-05 DIAGNOSIS — B957 Other staphylococcus as the cause of diseases classified elsewhere: Secondary | ICD-10-CM | POA: Diagnosis not present

## 2015-05-05 DIAGNOSIS — D735 Infarction of spleen: Secondary | ICD-10-CM | POA: Diagnosis present

## 2015-05-05 DIAGNOSIS — I351 Nonrheumatic aortic (valve) insufficiency: Secondary | ICD-10-CM | POA: Diagnosis not present

## 2015-05-05 DIAGNOSIS — Z959 Presence of cardiac and vascular implant and graft, unspecified: Secondary | ICD-10-CM | POA: Diagnosis not present

## 2015-05-05 DIAGNOSIS — I272 Other secondary pulmonary hypertension: Secondary | ICD-10-CM | POA: Diagnosis not present

## 2015-05-05 DIAGNOSIS — S37022A Major contusion of left kidney, initial encounter: Secondary | ICD-10-CM | POA: Diagnosis not present

## 2015-05-05 DIAGNOSIS — S37019A Minor contusion of unspecified kidney, initial encounter: Secondary | ICD-10-CM | POA: Diagnosis not present

## 2015-05-05 DIAGNOSIS — F17200 Nicotine dependence, unspecified, uncomplicated: Secondary | ICD-10-CM | POA: Diagnosis not present

## 2015-05-05 DIAGNOSIS — T826XXA Infection and inflammatory reaction due to cardiac valve prosthesis, initial encounter: Secondary | ICD-10-CM | POA: Diagnosis not present

## 2015-05-05 DIAGNOSIS — I120 Hypertensive chronic kidney disease with stage 5 chronic kidney disease or end stage renal disease: Secondary | ICD-10-CM | POA: Diagnosis not present

## 2015-05-05 DIAGNOSIS — R710 Precipitous drop in hematocrit: Secondary | ICD-10-CM | POA: Diagnosis not present

## 2015-05-05 DIAGNOSIS — Z66 Do not resuscitate: Secondary | ICD-10-CM | POA: Diagnosis present

## 2015-05-05 DIAGNOSIS — R1013 Epigastric pain: Secondary | ICD-10-CM | POA: Diagnosis not present

## 2015-05-05 DIAGNOSIS — R1032 Left lower quadrant pain: Secondary | ICD-10-CM | POA: Diagnosis not present

## 2015-05-05 DIAGNOSIS — E1122 Type 2 diabetes mellitus with diabetic chronic kidney disease: Secondary | ICD-10-CM | POA: Diagnosis present

## 2015-05-05 DIAGNOSIS — Z944 Liver transplant status: Secondary | ICD-10-CM | POA: Diagnosis not present

## 2015-05-05 DIAGNOSIS — F17210 Nicotine dependence, cigarettes, uncomplicated: Secondary | ICD-10-CM | POA: Diagnosis present

## 2015-05-05 DIAGNOSIS — Z7901 Long term (current) use of anticoagulants: Secondary | ICD-10-CM | POA: Diagnosis not present

## 2015-05-05 DIAGNOSIS — E860 Dehydration: Secondary | ICD-10-CM | POA: Diagnosis present

## 2015-05-05 DIAGNOSIS — G9341 Metabolic encephalopathy: Secondary | ICD-10-CM | POA: Diagnosis present

## 2015-05-05 DIAGNOSIS — Z952 Presence of prosthetic heart valve: Secondary | ICD-10-CM | POA: Diagnosis not present

## 2015-05-05 DIAGNOSIS — J45909 Unspecified asthma, uncomplicated: Secondary | ICD-10-CM | POA: Diagnosis not present

## 2015-05-11 DIAGNOSIS — Z992 Dependence on renal dialysis: Secondary | ICD-10-CM | POA: Diagnosis not present

## 2015-05-11 DIAGNOSIS — T8640 Unspecified complication of liver transplant: Secondary | ICD-10-CM | POA: Diagnosis not present

## 2015-05-11 DIAGNOSIS — N186 End stage renal disease: Secondary | ICD-10-CM | POA: Diagnosis not present

## 2015-05-15 ENCOUNTER — Ambulatory Visit: Payer: Medicare Other | Admitting: Cardiology

## 2015-05-16 ENCOUNTER — Encounter: Payer: Self-pay | Admitting: *Deleted

## 2015-05-22 ENCOUNTER — Other Ambulatory Visit: Payer: Self-pay | Admitting: Cardiovascular Disease

## 2015-05-22 NOTE — Telephone Encounter (Signed)
REFILL 

## 2015-05-25 DIAGNOSIS — E876 Hypokalemia: Secondary | ICD-10-CM | POA: Diagnosis not present

## 2015-05-25 DIAGNOSIS — N186 End stage renal disease: Secondary | ICD-10-CM | POA: Diagnosis not present

## 2015-05-25 DIAGNOSIS — D631 Anemia in chronic kidney disease: Secondary | ICD-10-CM | POA: Diagnosis not present

## 2015-05-25 DIAGNOSIS — D509 Iron deficiency anemia, unspecified: Secondary | ICD-10-CM | POA: Diagnosis not present

## 2015-05-25 DIAGNOSIS — N2581 Secondary hyperparathyroidism of renal origin: Secondary | ICD-10-CM | POA: Diagnosis not present

## 2015-05-25 DIAGNOSIS — E118 Type 2 diabetes mellitus with unspecified complications: Secondary | ICD-10-CM | POA: Diagnosis not present

## 2015-05-28 DIAGNOSIS — D631 Anemia in chronic kidney disease: Secondary | ICD-10-CM | POA: Diagnosis not present

## 2015-05-28 DIAGNOSIS — N2581 Secondary hyperparathyroidism of renal origin: Secondary | ICD-10-CM | POA: Diagnosis not present

## 2015-05-28 DIAGNOSIS — N186 End stage renal disease: Secondary | ICD-10-CM | POA: Diagnosis not present

## 2015-05-28 DIAGNOSIS — E118 Type 2 diabetes mellitus with unspecified complications: Secondary | ICD-10-CM | POA: Diagnosis not present

## 2015-05-28 DIAGNOSIS — I359 Nonrheumatic aortic valve disorder, unspecified: Secondary | ICD-10-CM | POA: Diagnosis not present

## 2015-05-28 DIAGNOSIS — E876 Hypokalemia: Secondary | ICD-10-CM | POA: Diagnosis not present

## 2015-05-28 DIAGNOSIS — D509 Iron deficiency anemia, unspecified: Secondary | ICD-10-CM | POA: Diagnosis not present

## 2015-05-28 LAB — PROTIME-INR: INR: 2.4 — AB (ref 0.9–1.1)

## 2015-05-30 ENCOUNTER — Ambulatory Visit (INDEPENDENT_AMBULATORY_CARE_PROVIDER_SITE_OTHER): Payer: Medicare Other | Admitting: Pharmacist Clinician (PhC)/ Clinical Pharmacy Specialist

## 2015-05-30 DIAGNOSIS — N2581 Secondary hyperparathyroidism of renal origin: Secondary | ICD-10-CM | POA: Diagnosis not present

## 2015-05-30 DIAGNOSIS — E118 Type 2 diabetes mellitus with unspecified complications: Secondary | ICD-10-CM | POA: Diagnosis not present

## 2015-05-30 DIAGNOSIS — Z7901 Long term (current) use of anticoagulants: Secondary | ICD-10-CM

## 2015-05-30 DIAGNOSIS — Z954 Presence of other heart-valve replacement: Secondary | ICD-10-CM

## 2015-05-30 DIAGNOSIS — D631 Anemia in chronic kidney disease: Secondary | ICD-10-CM | POA: Diagnosis not present

## 2015-05-30 DIAGNOSIS — E876 Hypokalemia: Secondary | ICD-10-CM | POA: Diagnosis not present

## 2015-05-30 DIAGNOSIS — D509 Iron deficiency anemia, unspecified: Secondary | ICD-10-CM | POA: Diagnosis not present

## 2015-05-30 DIAGNOSIS — N186 End stage renal disease: Secondary | ICD-10-CM | POA: Diagnosis not present

## 2015-05-30 DIAGNOSIS — Z952 Presence of prosthetic heart valve: Secondary | ICD-10-CM

## 2015-05-31 DIAGNOSIS — Z944 Liver transplant status: Secondary | ICD-10-CM | POA: Diagnosis not present

## 2015-05-31 DIAGNOSIS — Z5181 Encounter for therapeutic drug level monitoring: Secondary | ICD-10-CM | POA: Diagnosis not present

## 2015-06-01 DIAGNOSIS — D509 Iron deficiency anemia, unspecified: Secondary | ICD-10-CM | POA: Diagnosis not present

## 2015-06-01 DIAGNOSIS — D631 Anemia in chronic kidney disease: Secondary | ICD-10-CM | POA: Diagnosis not present

## 2015-06-01 DIAGNOSIS — E876 Hypokalemia: Secondary | ICD-10-CM | POA: Diagnosis not present

## 2015-06-01 DIAGNOSIS — E118 Type 2 diabetes mellitus with unspecified complications: Secondary | ICD-10-CM | POA: Diagnosis not present

## 2015-06-01 DIAGNOSIS — N186 End stage renal disease: Secondary | ICD-10-CM | POA: Diagnosis not present

## 2015-06-01 DIAGNOSIS — N2581 Secondary hyperparathyroidism of renal origin: Secondary | ICD-10-CM | POA: Diagnosis not present

## 2015-06-04 DIAGNOSIS — E118 Type 2 diabetes mellitus with unspecified complications: Secondary | ICD-10-CM | POA: Diagnosis not present

## 2015-06-04 DIAGNOSIS — I359 Nonrheumatic aortic valve disorder, unspecified: Secondary | ICD-10-CM | POA: Diagnosis not present

## 2015-06-04 DIAGNOSIS — N186 End stage renal disease: Secondary | ICD-10-CM | POA: Diagnosis not present

## 2015-06-04 DIAGNOSIS — E876 Hypokalemia: Secondary | ICD-10-CM | POA: Diagnosis not present

## 2015-06-04 DIAGNOSIS — D631 Anemia in chronic kidney disease: Secondary | ICD-10-CM | POA: Diagnosis not present

## 2015-06-04 DIAGNOSIS — D509 Iron deficiency anemia, unspecified: Secondary | ICD-10-CM | POA: Diagnosis not present

## 2015-06-04 DIAGNOSIS — N2581 Secondary hyperparathyroidism of renal origin: Secondary | ICD-10-CM | POA: Diagnosis not present

## 2015-06-05 LAB — PROTIME-INR: INR: 2.8 — AB (ref 0.9–1.1)

## 2015-06-06 ENCOUNTER — Ambulatory Visit (INDEPENDENT_AMBULATORY_CARE_PROVIDER_SITE_OTHER): Payer: Medicare Other | Admitting: Pharmacist Clinician (PhC)/ Clinical Pharmacy Specialist

## 2015-06-06 DIAGNOSIS — D631 Anemia in chronic kidney disease: Secondary | ICD-10-CM | POA: Diagnosis not present

## 2015-06-06 DIAGNOSIS — Z7901 Long term (current) use of anticoagulants: Secondary | ICD-10-CM

## 2015-06-06 DIAGNOSIS — Z954 Presence of other heart-valve replacement: Secondary | ICD-10-CM

## 2015-06-06 DIAGNOSIS — E876 Hypokalemia: Secondary | ICD-10-CM | POA: Diagnosis not present

## 2015-06-06 DIAGNOSIS — I359 Nonrheumatic aortic valve disorder, unspecified: Secondary | ICD-10-CM | POA: Diagnosis not present

## 2015-06-06 DIAGNOSIS — D509 Iron deficiency anemia, unspecified: Secondary | ICD-10-CM | POA: Diagnosis not present

## 2015-06-06 DIAGNOSIS — E039 Hypothyroidism, unspecified: Secondary | ICD-10-CM | POA: Diagnosis not present

## 2015-06-06 DIAGNOSIS — Z952 Presence of prosthetic heart valve: Secondary | ICD-10-CM

## 2015-06-06 DIAGNOSIS — N186 End stage renal disease: Secondary | ICD-10-CM | POA: Diagnosis not present

## 2015-06-06 DIAGNOSIS — E118 Type 2 diabetes mellitus with unspecified complications: Secondary | ICD-10-CM | POA: Diagnosis not present

## 2015-06-06 DIAGNOSIS — N2581 Secondary hyperparathyroidism of renal origin: Secondary | ICD-10-CM | POA: Diagnosis not present

## 2015-06-07 ENCOUNTER — Emergency Department (HOSPITAL_COMMUNITY)
Admission: EM | Admit: 2015-06-07 | Discharge: 2015-06-07 | Disposition: A | Discharge status: Home / Self Care | Payer: Medicare Other | Attending: Emergency Medicine | Admitting: Emergency Medicine

## 2015-06-07 ENCOUNTER — Encounter (HOSPITAL_COMMUNITY): Payer: Self-pay | Admitting: Emergency Medicine

## 2015-06-07 DIAGNOSIS — G8929 Other chronic pain: Secondary | ICD-10-CM | POA: Insufficient documentation

## 2015-06-07 DIAGNOSIS — E119 Type 2 diabetes mellitus without complications: Secondary | ICD-10-CM | POA: Diagnosis not present

## 2015-06-07 DIAGNOSIS — I120 Hypertensive chronic kidney disease with stage 5 chronic kidney disease or end stage renal disease: Secondary | ICD-10-CM | POA: Diagnosis not present

## 2015-06-07 DIAGNOSIS — I252 Old myocardial infarction: Secondary | ICD-10-CM | POA: Diagnosis not present

## 2015-06-07 DIAGNOSIS — R1110 Vomiting, unspecified: Secondary | ICD-10-CM | POA: Diagnosis not present

## 2015-06-07 DIAGNOSIS — E7800 Pure hypercholesterolemia, unspecified: Secondary | ICD-10-CM | POA: Diagnosis not present

## 2015-06-07 DIAGNOSIS — R109 Unspecified abdominal pain: Secondary | ICD-10-CM | POA: Insufficient documentation

## 2015-06-07 DIAGNOSIS — Z992 Dependence on renal dialysis: Secondary | ICD-10-CM | POA: Insufficient documentation

## 2015-06-07 DIAGNOSIS — J449 Chronic obstructive pulmonary disease, unspecified: Secondary | ICD-10-CM | POA: Insufficient documentation

## 2015-06-07 DIAGNOSIS — J9690 Respiratory failure, unspecified, unspecified whether with hypoxia or hypercapnia: Secondary | ICD-10-CM | POA: Diagnosis not present

## 2015-06-07 DIAGNOSIS — N186 End stage renal disease: Secondary | ICD-10-CM | POA: Insufficient documentation

## 2015-06-07 DIAGNOSIS — I2510 Atherosclerotic heart disease of native coronary artery without angina pectoris: Secondary | ICD-10-CM | POA: Insufficient documentation

## 2015-06-07 DIAGNOSIS — Z952 Presence of prosthetic heart valve: Secondary | ICD-10-CM | POA: Diagnosis not present

## 2015-06-07 DIAGNOSIS — I429 Cardiomyopathy, unspecified: Secondary | ICD-10-CM | POA: Diagnosis not present

## 2015-06-07 DIAGNOSIS — F17210 Nicotine dependence, cigarettes, uncomplicated: Secondary | ICD-10-CM | POA: Diagnosis not present

## 2015-06-07 DIAGNOSIS — Z888 Allergy status to other drugs, medicaments and biological substances status: Secondary | ICD-10-CM | POA: Diagnosis not present

## 2015-06-07 DIAGNOSIS — E669 Obesity, unspecified: Secondary | ICD-10-CM | POA: Insufficient documentation

## 2015-06-07 DIAGNOSIS — Z885 Allergy status to narcotic agent status: Secondary | ICD-10-CM | POA: Diagnosis not present

## 2015-06-07 DIAGNOSIS — R1084 Generalized abdominal pain: Secondary | ICD-10-CM | POA: Diagnosis not present

## 2015-06-07 DIAGNOSIS — R112 Nausea with vomiting, unspecified: Secondary | ICD-10-CM | POA: Diagnosis not present

## 2015-06-07 DIAGNOSIS — Z7901 Long term (current) use of anticoagulants: Secondary | ICD-10-CM | POA: Diagnosis not present

## 2015-06-07 LAB — COMPREHENSIVE METABOLIC PANEL
ALT: 12 U/L — AB (ref 14–54)
AST: 25 U/L (ref 15–41)
Albumin: 2.7 g/dL — ABNORMAL LOW (ref 3.5–5.0)
Alkaline Phosphatase: 79 U/L (ref 38–126)
Anion gap: 13 (ref 5–15)
BUN: 12 mg/dL (ref 6–20)
CHLORIDE: 96 mmol/L — AB (ref 101–111)
CO2: 29 mmol/L (ref 22–32)
CREATININE: 4.67 mg/dL — AB (ref 0.44–1.00)
Calcium: 10 mg/dL (ref 8.9–10.3)
GFR calc Af Amer: 11 mL/min — ABNORMAL LOW (ref >60)
GFR calc non Af Amer: 9 mL/min — ABNORMAL LOW (ref >60)
Glucose, Bld: 174 mg/dL — ABNORMAL HIGH (ref 65–99)
POTASSIUM: 4.7 mmol/L (ref 3.5–5.1)
SODIUM: 138 mmol/L (ref 135–145)
Total Bilirubin: 0.6 mg/dL (ref 0.3–1.2)
Total Protein: 7.7 g/dL (ref 6.5–8.1)

## 2015-06-07 LAB — LIPASE, BLOOD: LIPASE: 82 U/L — AB (ref 11–51)

## 2015-06-07 LAB — CBC
HEMATOCRIT: 29.8 % — AB (ref 36.0–46.0)
Hemoglobin: 9.6 g/dL — ABNORMAL LOW (ref 12.0–15.0)
MCH: 30.6 pg (ref 26.0–34.0)
MCHC: 32.2 g/dL (ref 30.0–36.0)
MCV: 94.9 fL (ref 78.0–100.0)
PLATELETS: 198 10*3/uL (ref 150–400)
RBC: 3.14 MIL/uL — AB (ref 3.87–5.11)
RDW: 20.3 % — AB (ref 11.5–15.5)
WBC: 7.6 10*3/uL (ref 4.0–10.5)

## 2015-06-07 NOTE — ED Notes (Signed)
Pt no answer x 3 

## 2015-06-07 NOTE — ED Notes (Signed)
No answer x1 for a room

## 2015-06-07 NOTE — ED Notes (Signed)
Pt reports that she started vomiting and having abd pain since last night. Pt reports she tried to take zofran with relief. PT alert x4.

## 2015-06-08 DIAGNOSIS — Z431 Encounter for attention to gastrostomy: Secondary | ICD-10-CM | POA: Diagnosis not present

## 2015-06-08 DIAGNOSIS — Z959 Presence of cardiac and vascular implant and graft, unspecified: Secondary | ICD-10-CM | POA: Diagnosis not present

## 2015-06-08 DIAGNOSIS — Z951 Presence of aortocoronary bypass graft: Secondary | ICD-10-CM | POA: Diagnosis not present

## 2015-06-08 DIAGNOSIS — N2889 Other specified disorders of kidney and ureter: Secondary | ICD-10-CM | POA: Diagnosis not present

## 2015-06-08 DIAGNOSIS — I2510 Atherosclerotic heart disease of native coronary artery without angina pectoris: Secondary | ICD-10-CM | POA: Diagnosis present

## 2015-06-08 DIAGNOSIS — N151 Renal and perinephric abscess: Secondary | ICD-10-CM | POA: Diagnosis not present

## 2015-06-08 DIAGNOSIS — R9431 Abnormal electrocardiogram [ECG] [EKG]: Secondary | ICD-10-CM | POA: Diagnosis not present

## 2015-06-08 DIAGNOSIS — I2129 ST elevation (STEMI) myocardial infarction involving other sites: Secondary | ICD-10-CM | POA: Diagnosis not present

## 2015-06-08 DIAGNOSIS — I440 Atrioventricular block, first degree: Secondary | ICD-10-CM | POA: Diagnosis not present

## 2015-06-08 DIAGNOSIS — D638 Anemia in other chronic diseases classified elsewhere: Secondary | ICD-10-CM | POA: Diagnosis present

## 2015-06-08 DIAGNOSIS — E1122 Type 2 diabetes mellitus with diabetic chronic kidney disease: Secondary | ICD-10-CM | POA: Diagnosis present

## 2015-06-08 DIAGNOSIS — R109 Unspecified abdominal pain: Secondary | ICD-10-CM | POA: Diagnosis not present

## 2015-06-08 DIAGNOSIS — S37022D Major contusion of left kidney, subsequent encounter: Secondary | ICD-10-CM | POA: Diagnosis not present

## 2015-06-08 DIAGNOSIS — N186 End stage renal disease: Secondary | ICD-10-CM | POA: Diagnosis present

## 2015-06-08 DIAGNOSIS — R112 Nausea with vomiting, unspecified: Secondary | ICD-10-CM | POA: Diagnosis not present

## 2015-06-08 DIAGNOSIS — Z992 Dependence on renal dialysis: Secondary | ICD-10-CM | POA: Diagnosis not present

## 2015-06-08 DIAGNOSIS — T826XXA Infection and inflammatory reaction due to cardiac valve prosthesis, initial encounter: Secondary | ICD-10-CM | POA: Diagnosis not present

## 2015-06-08 DIAGNOSIS — Z5181 Encounter for therapeutic drug level monitoring: Secondary | ICD-10-CM | POA: Diagnosis not present

## 2015-06-08 DIAGNOSIS — B1920 Unspecified viral hepatitis C without hepatic coma: Secondary | ICD-10-CM | POA: Diagnosis present

## 2015-06-08 DIAGNOSIS — I120 Hypertensive chronic kidney disease with stage 5 chronic kidney disease or end stage renal disease: Secondary | ICD-10-CM | POA: Diagnosis not present

## 2015-06-08 DIAGNOSIS — I722 Aneurysm of renal artery: Secondary | ICD-10-CM | POA: Diagnosis not present

## 2015-06-08 DIAGNOSIS — Z952 Presence of prosthetic heart valve: Secondary | ICD-10-CM | POA: Diagnosis not present

## 2015-06-08 DIAGNOSIS — Z944 Liver transplant status: Secondary | ICD-10-CM | POA: Diagnosis not present

## 2015-06-08 DIAGNOSIS — I272 Other secondary pulmonary hypertension: Secondary | ICD-10-CM | POA: Diagnosis present

## 2015-06-08 DIAGNOSIS — I129 Hypertensive chronic kidney disease with stage 1 through stage 4 chronic kidney disease, or unspecified chronic kidney disease: Secondary | ICD-10-CM | POA: Diagnosis not present

## 2015-06-08 DIAGNOSIS — E7800 Pure hypercholesterolemia, unspecified: Secondary | ICD-10-CM | POA: Diagnosis not present

## 2015-06-08 DIAGNOSIS — D72829 Elevated white blood cell count, unspecified: Secondary | ICD-10-CM | POA: Diagnosis not present

## 2015-06-08 DIAGNOSIS — I252 Old myocardial infarction: Secondary | ICD-10-CM | POA: Diagnosis not present

## 2015-06-08 DIAGNOSIS — J449 Chronic obstructive pulmonary disease, unspecified: Secondary | ICD-10-CM | POA: Diagnosis present

## 2015-06-08 DIAGNOSIS — S37022A Major contusion of left kidney, initial encounter: Secondary | ICD-10-CM | POA: Diagnosis not present

## 2015-06-08 DIAGNOSIS — D62 Acute posthemorrhagic anemia: Secondary | ICD-10-CM | POA: Diagnosis not present

## 2015-06-08 DIAGNOSIS — J90 Pleural effusion, not elsewhere classified: Secondary | ICD-10-CM | POA: Diagnosis not present

## 2015-06-08 DIAGNOSIS — I5022 Chronic systolic (congestive) heart failure: Secondary | ICD-10-CM | POA: Diagnosis present

## 2015-06-08 DIAGNOSIS — Z7901 Long term (current) use of anticoagulants: Secondary | ICD-10-CM | POA: Diagnosis not present

## 2015-06-08 DIAGNOSIS — R918 Other nonspecific abnormal finding of lung field: Secondary | ICD-10-CM | POA: Diagnosis not present

## 2015-06-08 DIAGNOSIS — K5900 Constipation, unspecified: Secondary | ICD-10-CM | POA: Diagnosis not present

## 2015-06-08 DIAGNOSIS — Z794 Long term (current) use of insulin: Secondary | ICD-10-CM | POA: Diagnosis not present

## 2015-06-08 DIAGNOSIS — T8640 Unspecified complication of liver transplant: Secondary | ICD-10-CM | POA: Diagnosis not present

## 2015-06-08 DIAGNOSIS — I132 Hypertensive heart and chronic kidney disease with heart failure and with stage 5 chronic kidney disease, or end stage renal disease: Secondary | ICD-10-CM | POA: Diagnosis present

## 2015-06-08 DIAGNOSIS — E039 Hypothyroidism, unspecified: Secondary | ICD-10-CM | POA: Diagnosis not present

## 2015-06-08 DIAGNOSIS — J45909 Unspecified asthma, uncomplicated: Secondary | ICD-10-CM | POA: Diagnosis not present

## 2015-06-08 DIAGNOSIS — E785 Hyperlipidemia, unspecified: Secondary | ICD-10-CM | POA: Diagnosis not present

## 2015-06-08 DIAGNOSIS — J9690 Respiratory failure, unspecified, unspecified whether with hypoxia or hypercapnia: Secondary | ICD-10-CM | POA: Diagnosis not present

## 2015-06-08 DIAGNOSIS — D735 Infarction of spleen: Secondary | ICD-10-CM | POA: Diagnosis not present

## 2015-06-08 DIAGNOSIS — R791 Abnormal coagulation profile: Secondary | ICD-10-CM | POA: Diagnosis not present

## 2015-06-08 DIAGNOSIS — H05231 Hemorrhage of right orbit: Secondary | ICD-10-CM | POA: Diagnosis not present

## 2015-06-08 DIAGNOSIS — Z9049 Acquired absence of other specified parts of digestive tract: Secondary | ICD-10-CM | POA: Diagnosis not present

## 2015-06-08 DIAGNOSIS — R1084 Generalized abdominal pain: Secondary | ICD-10-CM | POA: Diagnosis not present

## 2015-06-08 DIAGNOSIS — S37012A Minor contusion of left kidney, initial encounter: Secondary | ICD-10-CM | POA: Diagnosis not present

## 2015-06-08 DIAGNOSIS — D696 Thrombocytopenia, unspecified: Secondary | ICD-10-CM | POA: Diagnosis not present

## 2015-06-08 DIAGNOSIS — I429 Cardiomyopathy, unspecified: Secondary | ICD-10-CM | POA: Diagnosis not present

## 2015-06-08 DIAGNOSIS — N281 Cyst of kidney, acquired: Secondary | ICD-10-CM | POA: Diagnosis not present

## 2015-06-08 DIAGNOSIS — I509 Heart failure, unspecified: Secondary | ICD-10-CM | POA: Diagnosis not present

## 2015-06-08 DIAGNOSIS — E876 Hypokalemia: Secondary | ICD-10-CM | POA: Diagnosis not present

## 2015-06-08 DIAGNOSIS — D631 Anemia in chronic kidney disease: Secondary | ICD-10-CM | POA: Diagnosis not present

## 2015-06-08 DIAGNOSIS — I4581 Long QT syndrome: Secondary | ICD-10-CM | POA: Diagnosis not present

## 2015-06-08 DIAGNOSIS — F17210 Nicotine dependence, cigarettes, uncomplicated: Secondary | ICD-10-CM | POA: Diagnosis present

## 2015-06-08 DIAGNOSIS — J9600 Acute respiratory failure, unspecified whether with hypoxia or hypercapnia: Secondary | ICD-10-CM | POA: Diagnosis not present

## 2015-06-10 DIAGNOSIS — T8640 Unspecified complication of liver transplant: Secondary | ICD-10-CM | POA: Diagnosis not present

## 2015-06-10 DIAGNOSIS — Z992 Dependence on renal dialysis: Secondary | ICD-10-CM | POA: Diagnosis not present

## 2015-06-10 DIAGNOSIS — N186 End stage renal disease: Secondary | ICD-10-CM | POA: Diagnosis not present

## 2015-06-26 ENCOUNTER — Ambulatory Visit: Payer: Self-pay | Admitting: Pharmacist

## 2015-06-26 DIAGNOSIS — Z7901 Long term (current) use of anticoagulants: Secondary | ICD-10-CM

## 2015-06-26 DIAGNOSIS — Z952 Presence of prosthetic heart valve: Secondary | ICD-10-CM

## 2015-07-02 ENCOUNTER — Telehealth: Payer: Self-pay | Admitting: Cardiovascular Disease

## 2015-07-02 ENCOUNTER — Ambulatory Visit: Payer: Medicare Other | Admitting: Cardiovascular Disease

## 2015-07-02 NOTE — Telephone Encounter (Signed)
Pt is in the hospital,she wants to reschedule her appt please.

## 2015-07-03 ENCOUNTER — Ambulatory Visit: Payer: Medicare Other | Admitting: Cardiology

## 2015-07-04 ENCOUNTER — Encounter: Payer: Self-pay | Admitting: Cardiovascular Disease

## 2015-07-04 ENCOUNTER — Ambulatory Visit (INDEPENDENT_AMBULATORY_CARE_PROVIDER_SITE_OTHER): Payer: Medicare Other | Admitting: Cardiovascular Disease

## 2015-07-04 VITALS — BP 160/68 | HR 84 | Ht 64.5 in | Wt 138.6 lb

## 2015-07-04 DIAGNOSIS — Z952 Presence of prosthetic heart valve: Secondary | ICD-10-CM | POA: Diagnosis not present

## 2015-07-04 DIAGNOSIS — E785 Hyperlipidemia, unspecified: Secondary | ICD-10-CM | POA: Diagnosis not present

## 2015-07-04 DIAGNOSIS — Z7901 Long term (current) use of anticoagulants: Secondary | ICD-10-CM

## 2015-07-04 DIAGNOSIS — K219 Gastro-esophageal reflux disease without esophagitis: Secondary | ICD-10-CM | POA: Diagnosis not present

## 2015-07-04 DIAGNOSIS — Z905 Acquired absence of kidney: Secondary | ICD-10-CM

## 2015-07-04 DIAGNOSIS — M6281 Muscle weakness (generalized): Secondary | ICD-10-CM | POA: Diagnosis not present

## 2015-07-04 DIAGNOSIS — J449 Chronic obstructive pulmonary disease, unspecified: Secondary | ICD-10-CM | POA: Diagnosis not present

## 2015-07-04 DIAGNOSIS — F17210 Nicotine dependence, cigarettes, uncomplicated: Secondary | ICD-10-CM | POA: Diagnosis not present

## 2015-07-04 DIAGNOSIS — B1920 Unspecified viral hepatitis C without hepatic coma: Secondary | ICD-10-CM | POA: Diagnosis not present

## 2015-07-04 DIAGNOSIS — R262 Difficulty in walking, not elsewhere classified: Secondary | ICD-10-CM | POA: Diagnosis not present

## 2015-07-04 DIAGNOSIS — I359 Nonrheumatic aortic valve disorder, unspecified: Secondary | ICD-10-CM | POA: Diagnosis not present

## 2015-07-04 DIAGNOSIS — Z954 Presence of other heart-valve replacement: Secondary | ICD-10-CM | POA: Diagnosis not present

## 2015-07-04 DIAGNOSIS — Z9181 History of falling: Secondary | ICD-10-CM | POA: Diagnosis not present

## 2015-07-04 DIAGNOSIS — J45909 Unspecified asthma, uncomplicated: Secondary | ICD-10-CM | POA: Diagnosis not present

## 2015-07-04 DIAGNOSIS — Z944 Liver transplant status: Secondary | ICD-10-CM | POA: Diagnosis not present

## 2015-07-04 DIAGNOSIS — N186 End stage renal disease: Secondary | ICD-10-CM

## 2015-07-04 DIAGNOSIS — I509 Heart failure, unspecified: Secondary | ICD-10-CM | POA: Diagnosis not present

## 2015-07-04 DIAGNOSIS — I10 Essential (primary) hypertension: Secondary | ICD-10-CM | POA: Diagnosis not present

## 2015-07-04 DIAGNOSIS — N2581 Secondary hyperparathyroidism of renal origin: Secondary | ICD-10-CM | POA: Diagnosis not present

## 2015-07-04 DIAGNOSIS — Z992 Dependence on renal dialysis: Secondary | ICD-10-CM

## 2015-07-04 DIAGNOSIS — I252 Old myocardial infarction: Secondary | ICD-10-CM | POA: Diagnosis not present

## 2015-07-04 NOTE — Patient Instructions (Signed)
Your physician recommends that you schedule a follow-up appointment and echo in October.

## 2015-07-06 ENCOUNTER — Encounter: Payer: Self-pay | Admitting: Cardiovascular Disease

## 2015-07-06 DIAGNOSIS — N186 End stage renal disease: Secondary | ICD-10-CM | POA: Diagnosis not present

## 2015-07-06 DIAGNOSIS — N2581 Secondary hyperparathyroidism of renal origin: Secondary | ICD-10-CM | POA: Diagnosis not present

## 2015-07-06 DIAGNOSIS — Z905 Acquired absence of kidney: Secondary | ICD-10-CM | POA: Insufficient documentation

## 2015-07-06 NOTE — Progress Notes (Signed)
Patient ID: [name], female   DOB: 24-Dec-1955, 60 y.o.   MRN: [id number]    HPI: [name] is a 60 y.o. African American  female who presents to the office today for a 11 month follow up cardiology evaluation.  [name] has end-stage renal disease and is on chronic hemodialysis.  After undergoing liver transplantation at [facility] for hepatitis C,  she was admitted to [facility] in January 2015 with increasing chest pain.  A dobutamine echo study suggested severe AS with an ejection fraction of 45%.  Cardiac catheterization revealed mild CAD with 50% narrowing in moderately severe aortic stenosis with aortic valve area in the range of 0.8-1.2.  She ultimately underwent aortic valve replacement with mechanical aortic valve and single vessel LIMA to LAD CABG revascularization surgery on 03/15/2013 by Dr. [name].  Following discharge, she presented to the hospital for further evaluation of chest pain and palpitations.  Troponins were negative.  Beta blocker therapy was increased to following discharge.   She undergoes dialysis on Tuesday, Thursdays, and Saturdays.  She was  hospitalized from November 5 through 12/21/2013 and presented with chest pain episodes which occurred during dialysis.  Her ECG showed new T-wave inversion in inferior leads.  She underwent a repeat cardiac catheterization on November 6 by me which showed a focal 70% mid LAD stenosis after the takeoff of the second diagonal vessel, but the LIMA graft to the mid LAD was patent.  The left circumflex vessel had 20% smooth narrowing in the second marginal branch.  The large dominant RCA was normal.  Medical therapy was recommended.  She was re-coumadinized prior to discharge.  She was found to have AV graft bleeding/degeneration and underwent hemodialysis graft revision of her left thigh AV graft.  She was on heparin therapy during that time and ultimately was restarted back on Coumadin.  She was hospitalized from  05/11/2014 through 05/20/2014 on the vascular service with complications due to renal dialysis device implant and graft.  She also was seen in the emergency room with paroxysmal SVT which was treated with adenosine in 06/09/2014 and was seen on 06/17/2014 with mild chest pain and palpitations.    She underwent a sleep study to evaluate for sleep apnea.  She sleeps very poorly, admits to fatigue and daytime sleepiness.  Her sleep study was abnormal and demonstrated a significant increase in the respiratory disturbance index at 61.3/h, although the AHI was calculated at just 3.7/h.  She had a total of 9 obstructive apneas, 10 central apneas and 1 mixed apnea.  Her sleep architecture was abnormal with absence of slow-wave sleep and prolonged latency 2 REM sleep.  She had moderate snoring.  She did not have significant oxygen desaturation.   She is undergoing dialysis on Tuesday, Thursdays and Saturdays.  She believes she does not sleep as well on the days that she does not have dialysis admits to some shortness of breath nocturnally.  She is unaware where of any recurrent episodes of supraventricular tachycardia.  Since I last saw her, was hospitalized at [facility]  on 2 occasions in April and again in May 2017.  I reviewed her records from [facility] via care everywhere.  She was found to have a significant  left subcapsular hematoma of her kidney.  She ultimately underwent left kidney nephrectomy.. An echo Doppler study on 05/18/2015 showed normal to low normal LV function.  There was a least moderate perivalvular leak consistent with aortic regurgitation.  A subsequent echo on 06/19/2015 showed improvement with an EF of 5055% and only trace aortic regurgitation.  She has a mechanical aortic valve.  The pressure gradient was 33 mm peek with a mean of 15 mm.  She is undergoing dialysis on Monday, Wednesday and Friday.  Since her discharge from the hospital.  She denies chest pain.  She is weak.  She  presents for evaluation.    Past Medical History  Diagnosis Date  . 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 Chronic back pain   . CAD (coronary artery disease)   . Obesity   . Peripheral vascular disease (HCC) hands and legs  . Anxiety   . Asthma   . GERD (gastroesophageal reflux disease)     takes Omeprazole daily  . Chronic constipation     takes MIralax and Colace daily  . Anemia     takes Folic Acid daily  . Hypothyroidism     takes Synthroid daily  . Depression     takes Cymbalta for "severe" depression  . Neuromuscular disorder ([place])     carpal tunnel in right hand  . Hypertension     takes Metoprolol and Lisinopril daily, sees Dr Bea Graff  . COPD (chronic obstructive pulmonary disease) ([place])   . Pneumonia     "today and several times before" (08/30/2012)  . Chronic bronchitis ([place])     "q yr w/season changes" (08/30/2012)  . Type II diabetes mellitus (HCC)     Levemir 2units daily if > 150  . History of blood transfusion     "several" (08/30/2012)  . Hepatitis C   . Migraine     "last migraine was in 2013" (08/30/2012)  . Headache     "at least monthly" (08/30/2012)  . Arthritis     "left hand, back" (08/30/2012)  . End stage renal disease on dialysis [facility]) 02/27/2011    Kidneys shut down at time of liver transplant in Sept 2011 at [facility] in [place], she has been on HD ever since.  Dialyzes at [facility] HD on TTS schedule.  Had L forearm graft used 10 months then removed Dec 2012 due to suspected infection.  A right upper arm AVG was placed Dec 2012 but she developed steal symptoms acutely and it was ligated the same day.  Never had an AV fistula due to small veins.  Now has L thigh AVG put in Jan 2013, has not clotted to date.    [name] CAD (coronary artery disease) Jan. 2015    Cath: 20% LAD, 50% D1; s/p LIMA-LAD  . Aortic stenosis   . Tobacco abuse   . S/P aortic valve replacement 03/15/13     Mechanical   . SVT (supraventricular tachycardia) ([place]) 06/09/14    Past Surgical History  Procedure Laterality Date  . Liver transplant  10/25/2009    sees Dr Ferol Luz 1 every 6 months, saw last in Dec 2013. [name] Coord [phone]  . Small intestine surgery  90's  . Thrombectomy    . Arteriovenous graft placement Left 10/03/10     forearm  . Avgg removal  12/23/2010    Procedure: REMOVAL OF ARTERIOVENOUS GORETEX GRAFT ([place]);  Surgeon: Elam Dutch, MD;  Location: [facility];  Service: Vascular;  Laterality: Left;  procedure started _0 -1852  . Insertion of dialysis catheter  12/23/2010    Procedure: INSERTION  OF DIALYSIS CATHETER;  Surgeon: Elam Dutch, MD;  Location: [place];  Service: Vascular;  Laterality: Right;  Right Internal Jugular 28cm dialysis catheter insertion procedure time 1701-1720   . Cholecystectomy  1993  . Cystoscopy  1990's  . Spinal growth rods  2010    "put 2 metal rods in my back; they had detetriorated" (08/30/2012)  . Av fistula placement  01/29/2011    Procedure: INSERTION OF ARTERIOVENOUS (AV) GORE-TEX GRAFT ARM;  Surgeon: Elam Dutch, MD;  Location: [place];  Service: Vascular;  Laterality: Right;  . Av fistula placement  03/10/2011    Procedure: INSERTION OF ARTERIOVENOUS (AV) GORE-TEX GRAFT THIGH;  Surgeon: Elam Dutch, MD;  Location: [place];  Service: Vascular;  Laterality: Left;  . Tubal ligation  1990's  . Aortic valve replacement N/A 03/15/2013    AVR; Surgeon: Ivin Poot, MD;  Location: [facility] OR; Open Heart Surgery;  34m[employer] mechanical prosthesis, top hat valve  . Coronary artery bypass graft N/A 03/15/2013    Procedure: CORONARY ARTERY BYPASS GRAFTING (CABG) times one using left internal mammary artery.;  Surgeon: PIvin Poot MD;  [place]  Service: Open Heart Surgery;  Laterality: N/A;  POSS CABG X 1  . Intraoperative transesophageal echocardiogram N/A 03/15/2013    Procedure: INTRAOPERATIVE TRANSESOPHAGEAL  ECHOCARDIOGRAM;  Surgeon: PIvin Poot MD;  [place]  Service: Open Heart Surgery;  Laterality: N/A;  . Left heart catheterization with coronary/graft angiogram N/A 12/24/2011    Procedure: LEFT HEART CATHETERIZATION WITH CBeatrix Fetters  Surgeon: JLorretta Harp MD;  [facility];  Service: Cardiovascular;  Laterality: N/A;  . Left heart catheterization with coronary angiogram N/A 07/29/2012    Procedure: LEFT HEART CATHETERIZATION WITH CORONARY ANGIOGRAM;  Surgeon: TTroy Sine MD;  [facility];  Service: Cardiovascular;  Laterality: N/A;  . Left heart catheterization with coronary angiogram N/A 03/10/2013    Procedure: LEFT HEART CATHETERIZATION WITH CORONARY ANGIOGRAM;  Surgeon: TTroy Sine MD;  [facility];  Service: Cardiovascular;  Laterality: N/A;  . Left heart catheterization with coronary/graft angiogram N/A 12/16/2013    Procedure: LEFT HEART CATHETERIZATION WITH CBeatrix Fetters  Surgeon: TTroy Sine MD;  [facility];  Service: Cardiovascular;  Laterality: N/A;  . Thrombectomy and revision of arterioventous (av) goretex  graft Left 03/30/2014  . Thrombectomy and revision of arterioventous (av) goretex  graft Left 03/30/2014    Procedure: THROMBECTOMY AND REVISION OF ARTERIOVENTOUS (AV) GORETEX  GRAFT;  Surgeon: BConrad Kaplan MD;  [place]  Service: Vascular;  Laterality: Left;  . Shuntogram Left 05/15/2014    Procedure: SHUNTOGRAM;  Surgeon: BConrad [place] MD;  [facility];  Service: Cardiovascular;  Laterality: Left;    Allergies  Allergen Reactions  . Acetaminophen Other (See Comments)    Liver transplant recipient   . Codeine Itching  . Mirtazapine Other (See Comments)    hallucination  . Morphine Itching and Other (See Comments)    hallucinate    Current Outpatient Prescriptions  Medication Sig Dispense Refill  . albuterol (PROVENTIL,VENTOLIN) 90 MCG/ACT inhaler Inhale 1-2 puffs  into the lungs every 6 (six) hours as needed for wheezing or shortness of breath.     .Marland Kitchenaspirin EC 81 MG tablet Take 81 mg by mouth daily.     . calcium acetate (PHOSLO) 667 MG capsule Take 667-1,334 mg by mouth 3 (three) times daily with meals. Pt reports taking  1 tab with breakfast and lunch and 2 tabs with dinner.  (but says she rarely eats breakfast or lunch)    . insulin aspart (NOVOLOG) 100 UNIT/ML injection Inject 1-2 Units into the skin 3 (three) times daily as needed for high blood sugar (CBG>150). CBG < 150 = no insulin CBG 150-200 = 1 unit CBG 200-250 = 2 units    . ipratropium-albuterol (DUONEB) 0.5-2.5 (3) MG/3ML SOLN Take 3 mLs by nebulization every 6 (six) hours as needed. J44.1 code (Patient taking differently: Take 3 mLs by nebulization every 6 (six) hours as needed (shortness of breath, wheezing). J44.1 code) 360 mL 1  . levothyroxine (SYNTHROID, LEVOTHROID) 150 MCG tablet Take 150 mcg by mouth daily before breakfast.    . metoprolol tartrate (LOPRESSOR) 25 MG tablet Take 25 mg by mouth 2 (two) times daily.     . Multiple Vitamins-Minerals (MULTIVITAMIN WITH MINERALS) tablet Take 1 tablet by mouth daily.    . nitroGLYCERIN (NITROSTAT) 0.4 MG SL tablet Place 1 tablet (0.4 mg total) under the tongue every 5 (five) minutes as needed for chest pain. 30 tablet 0  . polyethylene glycol (MIRALAX / GLYCOLAX) packet Take 17 g by mouth daily as needed for mild constipation.     . SENSIPAR 60 MG tablet Take 60 mg by mouth daily.   99  . tacrolimus (PROGRAF) 1 MG capsule Take 3 mg by mouth 2 (two) times daily.     . traMADol (ULTRAM) 50 MG tablet Take 1 tablet (50 mg total) by mouth every 12 (twelve) hours as needed for moderate pain. 60 tablet 0  . verapamil (CALAN) 120 MG tablet TAKE 1 TABLET (120 MG TOTAL) BY MOUTH AT BEDTIME. NEED OV. 15 tablet 0  . warfarin (COUMADIN) 4 MG tablet Take 2-4 mg by mouth daily. 47m Monday Wednesday Friday, 229mall other days     No current  facility-administered medications for this visit.    Social History   Social History  . Marital Status: Divorced    Spouse Name: N/A  . Number of Children: N/A  . Years of Education: N/A   Occupational History  . Not on file.   Social History Main Topics  . Smoking status: Current Every Day Smoker -- 0.25 packs/day for 40 years    Types: Cigarettes  . Smokeless tobacco: Never Used     Comment: still smokng 1 or 2 a day  . Alcohol Use: No  . Drug Use: No  . Sexual Activity: No   Other Topics Concern  . Not on file   Social History Narrative    Family History  Problem Relation Age of Onset  . Cancer Mother   . Diabetes Mother   . Hypertension Mother   . Stroke Mother   . Cancer Father   . Anesthesia problems Neg Hx   . Hypotension Neg Hx   . Malignant hyperthermia Neg Hx   . Pseudochol deficiency Neg Hx     ROS General: Negative; No fevers, chills, or night sweats HEENT: Negative; No changes in vision or hearing, sinus congestion, difficulty swallowing Pulmonary: Negative; No cough, wheezing, shortness of breath, hemoptysis Cardiovascular: See history of present illness GI: Negative; No nausea, vomiting, diarrhea, or abdominal pain GU: Negative; No dysuria, hematuria, or difficulty voiding Musculoskeletal: Negative; no myalgias, joint pain, or weakness Hematologic: Negative; no easy bruising, bleeding Endocrine: Positive for hypothyroidism; no heat/cold intolerance; no diabetes, Neuro: Perform neuropathy; no changes in balance, headaches Skin: Negative; No rashes or skin  lesions Psychiatric: Negative; No behavioral problems, depression Sleep: positive for witnessed apnea No snoring,  daytime sleepiness, hypersomnolence, bruxism, restless legs, hypnogognic hallucinations. Other comprehensive 14 point system review is negative    Physical Exam BP 160/68 mmHg  Pulse 84  Ht 5' 4.5" (1.638 m)  Wt 138 lb 9.6 oz (62.869 kg)  BMI 23.43 kg/m2   Wt Readings from  Last 3 Encounters:  07/04/15 138 lb 9.6 oz (62.869 kg)  05/02/15 160 lb (72.576 kg)  04/21/15 161 lb 9.6 oz (73.3 kg)   General: Alert, oriented, no distress.  Skin: normal turgor, no rashes, warm and dry HEENT: Normocephalic, atraumatic. Pupils equal round and reactive to light; sclera anicteric; extraocular muscles intact, No lid lag; Nose without nasal septal hypertrophy; Mouth/Parynx benign; Mallinpatti scale 3 Neck: No JVD, no carotid bruits; normal carotid upstroke Lungs: clear to ausculatation and percussion bilaterally; no wheezing or rales, normal inspiratory and expiratory effort Chest wall: without tenderness to palpitation Heart: PMI not displaced, RRR, s1 s2 normal, 2/6 systolic murmur, crisp mechanical heart sounds; No diastolic murmur, no rubs, gallops, thrills, or heaves Abdomen: soft, nontender; no hepatosplenomehaly, BS+; abdominal aorta nontender and not dilated by palpation. Back: no CVA tenderness Pulses: 2+  Musculoskeletal: full range of motion, normal strength, no joint deformities Extremities: Pulses 2+, no clubbing cyanosis or edema, Homan's sign negative  Neurologic: grossly nonfocal; Cranial nerves grossly wnl Psychologic: Normal mood and affect  ECG (independently read by me): Normal sinus rhythm.  QRS complex V1 V2.  Nonspecific T-wave abnormality.  June 2016 ECG (independently read by me): Normal sinus rhythm at 81 bpm.  Probable left atrial enlargement.  QS complex anteriorly.  Increased QTc interval.  Inferolateral T changes.  06/19/2014 ECG (independently read by me): Normal sinus rhythm at 77 bpm.  Nondiagnostic T changes in leads 1 aVL and V6.  March 2016 ECG (independently read by me): Normal sinus rhythm at 69 bpm.  Borderline first degree AV block with a PR interval 204 ms.  Inferolateral T wave abnormality.  November 2015 ECG (independently read by me): Normal sinus rhythm.  Borderline first-degree AV block with a PR interval of 202.  T  abnormalities in leads 1 and L2 and V6.  LVH by voltage.  May 2015 ECG (independently read by me): Normal sinus rhythm at 72 beats per minute.  T wave changes in leads 1 and L. and V6.  QTC 486 ms.  LABS:  BMP Latest Ref Rng 06/07/2015 05/02/2015 04/21/2015  Glucose 65 - 99 mg/dL 174(H) 162(H) 194(H)  BUN 6 - 20 mg/dL 12 5(L) 22(H)  Creatinine 0.44 - 1.00 mg/dL 4.67(H) 5.06(H) 8.62(H)  Sodium 135 - 145 mmol/L 138 135 137  Potassium 3.5 - 5.1 mmol/L 4.7 3.6 3.0(L)  Chloride 101 - 111 mmol/L 96(L) 95(L) 97(L)  CO2 22 - 32 mmol/L 29 32 25  Calcium 8.9 - 10.3 mg/dL 10.0 8.5(L) 8.9    Hepatic Function Latest Ref Rng 06/07/2015 04/21/2015 01/23/2015  Total Protein 6.5 - 8.1 g/dL 7.7 7.3 7.3  Albumin 3.5 - 5.0 g/dL 2.7(L) 3.4(L) 3.3(L)  AST 15 - 41 U/L 25 15 14(L)  ALT 14 - 54 U/L 12(L) 9(L) 8(L)  Alk Phosphatase 38 - 126 U/L 79 54 75  Total Bilirubin 0.3 - 1.2 mg/dL 0.6 0.9 1.2    CBC Latest Ref Rng 06/07/2015 05/02/2015 04/21/2015  WBC 4.0 - 10.5 K/uL 7.6 5.8 6.1  Hemoglobin 12.0 - 15.0 g/dL 9.6(L) 9.7(L) 9.8(L)  Hematocrit 36.0 - 46.0 %  29.8(L) 31.3(L) 30.3(L)  Platelets 150 - 400 K/uL 198 128(L) 100(L)   Lab Results  Component Value Date   MCV 94.9 06/07/2015   MCV 99.1 05/02/2015   MCV 102.0* 04/21/2015     BNP    Component Value Date/Time   PROBNP 33098.0* 01/02/2014 2318    Lipid Panel     Component Value Date/Time   CHOL 225* 12/22/2011 1030   TRIG 121 12/22/2011 1030   HDL 97 12/22/2011 1030   CHOLHDL 2.3 12/22/2011 1030   VLDL 24 12/22/2011 1030   LDLCALC 104* 12/22/2011 1030     RADIOLOGY: Dg Chest 2 View  06/01/2013   CLINICAL DATA:  History of aortic valve replacement in February, follow-up  EXAM: CHEST  2 VIEW  COMPARISON:  Chest x-ray 04/20/2013, and CT chest of 03/11/2013  FINDINGS: Cardiomegaly is stable. There is very mild pulmonary vascular congestion present. No effusion is seen. A nodular opacity in the medial left lung apex is stable and compared to  the prior CT chest represents a bony density from the upper thoracic spine. Median sternotomy sutures remain and an aortic valve replacement is present. No acute bony abnormality is seen. Multiple surgical clips are present within the epigastrium.  IMPRESSION: 1. Stable cardiomegaly. 2. Mild pulmonary vascular congestion.   Electronically Signed   By: Ivar Drape M.D.   On: 06/01/2013 09:40    I personally reviewed the   hospital records from her 2 hospitalizations at [facility] in April and again in May 2017.  I reviewed her TEE and transthoracic echo studies  ASSESSMENT AND PLAN: Ms. [name] is a 60 year old African-American female who underwent mechanical aortic valve replacement for moderately severe symptomatic aortic stenosis and single-vessel LIMA to LAD CABG revascularization surgery in February 2015.  She had been readmitted with episodes of tachycardia and shortness of breath.   She had developed recurrent episode of SVT which responsive to adenosine.  Since I last saw her, she was hospitalized at [facility] and due to significant hematoma in her left kidney.  She tells me she underwent 11th rib and left kidney removal.  During the initial hospitalization, she was felt to have a possible moderate perivalvular leak around her prosthetic aortic valve.  On subsequent TEE testing.  Trace aortic regurgitation.  Her mean pressure gradient was 15 mm with a peak gradient of 33 concordant with her mechanical prosthesis.  Presently, she is without chest pain.  She does not have overt signs of CHF.  She is undergoing dialysis on Monday, Wednesday and Friday.  I have reviewed recent laboratory.  I will schedule her for a repeat echo Doppler study in October 2017 to reevaluate her aortic valve.  I'll see her in the office in October following her echo Doppler study for further evaluation.  Troy Sine, MD, [facility]  07/06/2015 10:33 PM

## 2015-07-09 ENCOUNTER — Emergency Department (HOSPITAL_COMMUNITY): Payer: Medicare Other

## 2015-07-09 ENCOUNTER — Emergency Department (HOSPITAL_COMMUNITY)
Admission: EM | Admit: 2015-07-09 | Discharge: 2015-07-10 | Disposition: A | Discharge status: Other Acute Inpatient Hospital | Payer: Medicare Other | Attending: Emergency Medicine | Admitting: Emergency Medicine

## 2015-07-09 ENCOUNTER — Encounter (HOSPITAL_COMMUNITY): Payer: Self-pay | Admitting: Emergency Medicine

## 2015-07-09 DIAGNOSIS — Z992 Dependence on renal dialysis: Secondary | ICD-10-CM | POA: Insufficient documentation

## 2015-07-09 DIAGNOSIS — I2510 Atherosclerotic heart disease of native coronary artery without angina pectoris: Secondary | ICD-10-CM | POA: Diagnosis not present

## 2015-07-09 DIAGNOSIS — K219 Gastro-esophageal reflux disease without esophagitis: Secondary | ICD-10-CM | POA: Insufficient documentation

## 2015-07-09 DIAGNOSIS — R000 Tachycardia, unspecified: Secondary | ICD-10-CM | POA: Insufficient documentation

## 2015-07-09 DIAGNOSIS — Z79891 Long term (current) use of opiate analgesic: Secondary | ICD-10-CM | POA: Diagnosis not present

## 2015-07-09 DIAGNOSIS — J449 Chronic obstructive pulmonary disease, unspecified: Secondary | ICD-10-CM | POA: Insufficient documentation

## 2015-07-09 DIAGNOSIS — I120 Hypertensive chronic kidney disease with stage 5 chronic kidney disease or end stage renal disease: Secondary | ICD-10-CM | POA: Insufficient documentation

## 2015-07-09 DIAGNOSIS — F17210 Nicotine dependence, cigarettes, uncomplicated: Secondary | ICD-10-CM | POA: Insufficient documentation

## 2015-07-09 DIAGNOSIS — D735 Infarction of spleen: Secondary | ICD-10-CM | POA: Diagnosis not present

## 2015-07-09 DIAGNOSIS — Z905 Acquired absence of kidney: Secondary | ICD-10-CM | POA: Diagnosis not present

## 2015-07-09 DIAGNOSIS — R109 Unspecified abdominal pain: Secondary | ICD-10-CM | POA: Diagnosis present

## 2015-07-09 DIAGNOSIS — N186 End stage renal disease: Secondary | ICD-10-CM | POA: Insufficient documentation

## 2015-07-09 DIAGNOSIS — J90 Pleural effusion, not elsewhere classified: Secondary | ICD-10-CM | POA: Diagnosis not present

## 2015-07-09 DIAGNOSIS — Z951 Presence of aortocoronary bypass graft: Secondary | ICD-10-CM | POA: Diagnosis not present

## 2015-07-09 DIAGNOSIS — Z7901 Long term (current) use of anticoagulants: Secondary | ICD-10-CM | POA: Insufficient documentation

## 2015-07-09 DIAGNOSIS — Z7982 Long term (current) use of aspirin: Secondary | ICD-10-CM | POA: Insufficient documentation

## 2015-07-09 DIAGNOSIS — E1122 Type 2 diabetes mellitus with diabetic chronic kidney disease: Secondary | ICD-10-CM | POA: Insufficient documentation

## 2015-07-09 DIAGNOSIS — E039 Hypothyroidism, unspecified: Secondary | ICD-10-CM | POA: Diagnosis not present

## 2015-07-09 DIAGNOSIS — N2581 Secondary hyperparathyroidism of renal origin: Secondary | ICD-10-CM | POA: Diagnosis not present

## 2015-07-09 DIAGNOSIS — R1111 Vomiting without nausea: Secondary | ICD-10-CM | POA: Diagnosis not present

## 2015-07-09 DIAGNOSIS — Z79899 Other long term (current) drug therapy: Secondary | ICD-10-CM | POA: Insufficient documentation

## 2015-07-09 DIAGNOSIS — Z794 Long term (current) use of insulin: Secondary | ICD-10-CM | POA: Insufficient documentation

## 2015-07-09 DIAGNOSIS — I359 Nonrheumatic aortic valve disorder, unspecified: Secondary | ICD-10-CM | POA: Diagnosis not present

## 2015-07-09 DIAGNOSIS — K2970 Gastritis, unspecified, without bleeding: Secondary | ICD-10-CM | POA: Diagnosis not present

## 2015-07-09 LAB — COMPREHENSIVE METABOLIC PANEL
ALBUMIN: 2.1 g/dL — AB (ref 3.5–5.0)
ALK PHOS: 114 U/L (ref 38–126)
ALT: 10 U/L — ABNORMAL LOW (ref 14–54)
AST: 22 U/L (ref 15–41)
Anion gap: 10 (ref 5–15)
CO2: 27 mmol/L (ref 22–32)
Calcium: 8.8 mg/dL — ABNORMAL LOW (ref 8.9–10.3)
Chloride: 101 mmol/L (ref 101–111)
Creatinine, Ser: 2.87 mg/dL — ABNORMAL HIGH (ref 0.44–1.00)
GFR calc Af Amer: 19 mL/min — ABNORMAL LOW (ref >60)
GFR calc non Af Amer: 17 mL/min — ABNORMAL LOW (ref >60)
GLUCOSE: 93 mg/dL (ref 65–99)
POTASSIUM: 4.2 mmol/L (ref 3.5–5.1)
Sodium: 138 mmol/L (ref 135–145)
TOTAL PROTEIN: 6.9 g/dL (ref 6.5–8.1)
Total Bilirubin: 1.3 mg/dL — ABNORMAL HIGH (ref 0.3–1.2)

## 2015-07-09 LAB — CBC WITH DIFFERENTIAL/PLATELET
Basophils Absolute: 0.2 10*3/uL — ABNORMAL HIGH (ref 0.0–0.1)
Basophils Relative: 1 %
EOS ABS: 0.1 10*3/uL (ref 0.0–0.7)
Eosinophils Relative: 1 %
HEMATOCRIT: 34.8 % — AB (ref 36.0–46.0)
HEMOGLOBIN: 11.3 g/dL — AB (ref 12.0–15.0)
LYMPHS ABS: 2 10*3/uL (ref 0.7–4.0)
Lymphocytes Relative: 15 %
MCH: 28.6 pg (ref 26.0–34.0)
MCHC: 32.5 g/dL (ref 30.0–36.0)
MCV: 88.1 fL (ref 78.0–100.0)
MONOS PCT: 11 %
Monocytes Absolute: 1.4 10*3/uL — ABNORMAL HIGH (ref 0.1–1.0)
NEUTROS PCT: 72 %
Neutro Abs: 9.3 10*3/uL — ABNORMAL HIGH (ref 1.7–7.7)
Platelets: 395 10*3/uL (ref 150–400)
RBC: 3.95 MIL/uL (ref 3.87–5.11)
RDW: 14.9 % (ref 11.5–15.5)
WBC: 12.8 10*3/uL — ABNORMAL HIGH (ref 4.0–10.5)

## 2015-07-09 LAB — I-STAT CG4 LACTIC ACID, ED
LACTIC ACID, VENOUS: 1.64 mmol/L (ref 0.5–2.0)
Lactic Acid, Venous: 2.09 mmol/L (ref 0.5–2.0)
Lactic Acid, Venous: 1.62 mmol/L (ref 0.5–2.0)

## 2015-07-09 LAB — LIPASE, BLOOD: Lipase: 18 U/L (ref 11–51)

## 2015-07-09 LAB — BRAIN NATRIURETIC PEPTIDE: B NATRIURETIC PEPTIDE 5: 1495 pg/mL — AB (ref 0.0–100.0)

## 2015-07-09 LAB — PROTIME-INR

## 2015-07-09 MED ORDER — PIPERACILLIN-TAZOBACTAM IN DEX 2-0.25 GM/50ML IV SOLN
2.2500 g | Freq: Three times a day (TID) | INTRAVENOUS | Status: DC
  Filled 2015-07-09: qty 50

## 2015-07-09 MED ORDER — VANCOMYCIN HCL IN DEXTROSE 1-5 GM/200ML-% IV SOLN
1000.0000 mg | Freq: Once | INTRAVENOUS | Status: AC
  Administered 2015-07-09: 1000 mg via INTRAVENOUS
  Filled 2015-07-09: qty 200

## 2015-07-09 MED ORDER — ONDANSETRON HCL 4 MG/2ML IJ SOLN
4.0000 mg | Freq: Once | INTRAMUSCULAR | Status: AC
  Administered 2015-07-09: 4 mg via INTRAVENOUS
  Filled 2015-07-09: qty 2

## 2015-07-09 MED ORDER — FENTANYL CITRATE (PF) 100 MCG/2ML IJ SOLN
50.0000 ug | INTRAMUSCULAR | Status: AC | PRN
Start: 2015-07-09 — End: 2015-07-09
  Administered 2015-07-09 (×3): 50 ug via INTRAVENOUS
  Filled 2015-07-09 (×3): qty 2

## 2015-07-09 MED ORDER — METOCLOPRAMIDE HCL 5 MG/ML IJ SOLN
10.0000 mg | Freq: Once | INTRAMUSCULAR | Status: AC
  Administered 2015-07-09: 10 mg via INTRAVENOUS
  Filled 2015-07-09: qty 2

## 2015-07-09 MED ORDER — IOPAMIDOL (ISOVUE-300) INJECTION 61%
INTRAVENOUS | Status: AC
  Administered 2015-07-09: 100 mL
  Filled 2015-07-09: qty 100

## 2015-07-09 MED ORDER — SODIUM CHLORIDE 0.9 % IV BOLUS (SEPSIS)
500.0000 mL | Freq: Once | INTRAVENOUS | Status: AC
  Administered 2015-07-09: 500 mL via INTRAVENOUS

## 2015-07-09 MED ORDER — PIPERACILLIN-TAZOBACTAM 3.375 G IVPB 30 MIN
3.3750 g | Freq: Once | INTRAVENOUS | Status: AC
  Administered 2015-07-09: 3.375 g via INTRAVENOUS
  Filled 2015-07-09: qty 50

## 2015-07-09 MED ORDER — IOPAMIDOL (ISOVUE-300) INJECTION 61%
INTRAVENOUS | Status: AC
  Filled 2015-07-09: qty 100

## 2015-07-09 NOTE — ED Provider Notes (Signed)
I saw and evaluated the patient, reviewed the resident's note and I agree with the findings and plan.   EKG Interpretation   Date/Time:  Monday Jul 09 2015 16:47:00 EDT Ventricular Rate:  94 PR Interval:  149 QRS Duration: 94 QT Interval:  391 QTC Calculation: 489 R Axis:   14 Text Interpretation:  Sinus rhythm LVH with secondary repolarization  abnormality Anterior infarct, old No significant change since last tracing  Confirmed by Navayah Sok  MD, Claborn Janusz [phone]) on 07/09/2015 5:01:11 PM      Results for orders placed or performed during the hospital encounter of 07/09/15  Comprehensive metabolic panel  Result Value Ref Range   Sodium 138 135 - 145 mmol/L   Potassium 4.2 3.5 - 5.1 mmol/L   Chloride 101 101 - 111 mmol/L   CO2 27 22 - 32 mmol/L   Glucose, Bld 93 65 - 99 mg/dL   BUN <5 (L) 6 - 20 mg/dL   Creatinine, Ser 2.87 (H) 0.44 - 1.00 mg/dL   Calcium 8.8 (L) 8.9 - 10.3 mg/dL   Total Protein 6.9 6.5 - 8.1 g/dL   Albumin 2.1 (L) 3.5 - 5.0 g/dL   AST 22 15 - 41 U/L   ALT 10 (L) 14 - 54 U/L   Alkaline Phosphatase 114 38 - 126 U/L   Total Bilirubin 1.3 (H) 0.3 - 1.2 mg/dL   GFR calc non Af Amer 17 (L) >60 mL/min   GFR calc Af Amer 19 (L) >60 mL/min   Anion gap 10 5 - 15  CBC WITH DIFFERENTIAL  Result Value Ref Range   WBC 12.8 (H) 4.0 - 10.5 K/uL   RBC 3.95 3.87 - 5.11 MIL/uL   Hemoglobin 11.3 (L) 12.0 - 15.0 g/dL   HCT 34.8 (L) 36.0 - 46.0 %   MCV 88.1 78.0 - 100.0 fL   MCH 28.6 26.0 - 34.0 pg   MCHC 32.5 30.0 - 36.0 g/dL   RDW 14.9 11.5 - 15.5 %   Platelets 395 150 - 400 K/uL   Neutrophils Relative % 72 %   Neutro Abs 9.3 (H) 1.7 - 7.7 K/uL   Lymphocytes Relative 15 %   Lymphs Abs 2.0 0.7 - 4.0 K/uL   Monocytes Relative 11 %   Monocytes Absolute 1.4 (H) 0.1 - 1.0 K/uL   Eosinophils Relative 1 %   Eosinophils Absolute 0.1 0.0 - 0.7 K/uL   Basophils Relative 1 %   Basophils Absolute 0.2 (H) 0.0 - 0.1 K/uL  Lipase, blood  Result Value Ref Range   Lipase 18 11 -  51 U/L  Brain natriuretic peptide  Result Value Ref Range   B Natriuretic Peptide 1495.0 (H) 0.0 - 100.0 pg/mL  I-Stat CG4 Lactic Acid, ED  (not at [facility])  Result Value Ref Range   Lactic Acid, Venous 2.09 (HH) 0.5 - 2.0 mmol/L   Comment NOTIFIED PHYSICIAN   I-Stat CG4 Lactic Acid, ED  (not at  [facility])  Result Value Ref Range   Lactic Acid, Venous 1.62 0.5 - 2.0 mmol/L   Ct Abdomen Pelvis W Contrast  07/09/2015  CLINICAL DATA:  Abdominal pain. Dialysis patient. 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 Liver transplant years ago. EXAM: CT ABDOMEN AND PELVIS WITH CONTRAST TECHNIQUE: Multidetector CT imaging of the abdomen and pelvis was performed using the standard protocol following bolus administration of intravenous contrast. CONTRAST:  175mL ISOVUE-300 IOPAMIDOL (ISOVUE-300) INJECTION 61% COMPARISON:  CT abdomen pelvis 05/19/2015 FINDINGS: Lower chest: Moderately large left effusion has progressed in the  interval. Left lower lobe compressive atelectasis. Small right effusion. Extensive coronary calcification. Heart size mildly enlarged. Hepatobiliary: Liver transplant. No focal liver lesion or cirrhosis. Gallbladder absent. Bile ducts nondilated. Pancreas: Negative Spleen: The spleen was present on the prior study but is no longer visualized. There is a large fluid collection in the region of the spleen under the left hemidiaphragm with a small amount of residual spleen medially. I suspect splenic infarct and hematoma. There is a large fluid collection in the splenic bed measuring 10 x 13 cm which was not present previously. This appears higher than the left nephrectomy bed hematoma seen on the prior study which has largely resolved. Adrenals/Urinary Tract: Left nephrectomy. Large hematoma with gas bubbles in the nephrectomy bed on the prior study has largely resolved. Small amount of fluid deep to the lateral abdominal wall has developed in the interval. Multiple small cysts in the right kidney without  hydronephrosis or mass. Urinary bladder is empty. Stomach/Bowel: There is edema in the stomach and antrum suggesting gastritis. This was present previously. No bowel obstruction. Vascular/Lymphatic: Atherosclerotic aorta without aneurysm. Atherosclerotic iliac arteries and visceral arteries. Reproductive: Negative for mass.  Normal uterus. Other: No free fluid. Musculoskeletal: Lumbar fusion L4 through S1. Negative for fracture. No acute bony abnormality. IMPRESSION: Interval splenic infarction with large fluid collection in the splenic bed measuring 10 x 13 cm. Spleen appeared normal previously. Large hematoma in the left nephrectomy bed on the prior CT of 06/08/2015 has largely resolved. Mucosal edema of the stomach and antrum suggesting gastritis. Negative for bowel obstruction. Moderately large left pleural effusion has progressed in the interval with left lower lobe atelectasis. Small right effusion. Electronically Signed   By: Franchot Gallo M.D.   On: 07/09/2015 20:21   Dg Chest Portable 1 View  07/09/2015  CLINICAL DATA:  Short of breath. Dialysis patient. Chest pain. Liver transplant. EXAM: PORTABLE CHEST 1 VIEW COMPARISON:  06/07/2015 FINDINGS: Moderately large left pleural effusion has developed since the prior study. There is compressive atelectasis in the left lower lobe. Right lung is clear. Prior aortic valve replacement.  No pulmonary edema identified. IMPRESSION: Interval development of moderately large left pleural effusion with left lower lobe atelectasis. Electronically Signed   By: Franchot Gallo M.D.   On: 07/09/2015 17:39    Patient status post nephrectomy at [facility] for a large hematoma around the kidney.  left kidney that was removed. Patient had a large hematoma at that time CT scan shows it as largely resolved. However does show interval splenic infarctions with large fluid collection around the spleen bed measuring 10 x 13. Possibility could be an abscess. Chest x-ray shows a large  left pleural effusion as well. Patient was brought in by [facility]. Patient is a dialysis patient and had full dialysis treatment this morning. Patient was brought in for abdominal pain and vomiting. Urology service at [facility] will see the patient in transfer. Patient was transferred to the emergency department. Suspect she will be admitted. Patient initially had an elevated lactic acid so septic protocol things were ordered. However patient's repeat lactic acid was normal. No fever here. Patient satting 100%. Blood pressure actually elevated last systolic was [id number]. Patient's potassium is not elevated.  Fredia Sorrow, MD 07/09/15 2145

## 2015-07-09 NOTE — ED Provider Notes (Signed)
CSN: [id number]     Arrival date & time 07/09/15  1636 History   First MD Initiated Contact with Patient 07/09/15 1645     Chief Complaint  Patient presents with  . Abdominal Pain  . Emesis     (Consider location/radiation/quality/duration/timing/severity/associated sxs/prior Treatment) Patient is a 60 y.o. female presenting with general illness. The history is provided by the patient and medical records.  Illness Location:  Abdominal pain Severity:  Moderate Onset quality:  Gradual Duration:  1 hour Timing:  Constant Progression:  Worsening Chronicity:  New Context:  Complicated past medical history. Recent left-sided nephrectomy. Acute onset of worsening of abdominal pain, nausea, vomiting while at dialysis. Associated symptoms: abdominal pain, nausea and vomiting   Associated symptoms: no chest pain, no cough, no diarrhea, no fever, no headaches and no shortness of breath     Past Medical History  Diagnosis Date  . 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 Chronic back pain   . CAD (coronary artery disease)   . Obesity   . Peripheral vascular disease (HCC) hands and legs  . Anxiety   . Asthma   . GERD (gastroesophageal reflux disease)     takes Omeprazole daily  . Chronic constipation     takes MIralax and Colace daily  . Anemia     takes Folic Acid daily  . Hypothyroidism     takes Synthroid daily  . Depression     takes Cymbalta for "severe" depression  . Neuromuscular disorder ([place])     carpal tunnel in right hand  . Hypertension     takes Metoprolol and Lisinopril daily, sees Dr Bea Graff  . COPD (chronic obstructive pulmonary disease) ([place])   . Pneumonia     "today and several times before" (08/30/2012)  . Chronic bronchitis ([place])     "q yr w/season changes" (08/30/2012)  . Type II diabetes mellitus (HCC)     Levemir 2units daily if > 150  . History of blood transfusion    "several" (08/30/2012)  . Hepatitis C   . Migraine     "last migraine was in 2013" (08/30/2012)  . Headache     "at least monthly" (08/30/2012)  . Arthritis     "left hand, back" (08/30/2012)  . End stage renal disease on dialysis [facility]) 02/27/2011    Kidneys shut down at time of liver transplant in Sept 2011 at [facility] in [place], she has been on HD ever since.  Dialyzes at [facility] HD on TTS schedule.  Had L forearm graft used 10 months then removed Dec 2012 due to suspected infection.  A right upper arm AVG was placed Dec 2012 but she developed steal symptoms acutely and it was ligated the same day.  Never had an AV fistula due to small veins.  Now has L thigh AVG put in Jan 2013, has not clotted to date.    [name] CAD (coronary artery disease) Jan. 2015    Cath: 20% LAD, 50% D1; s/p LIMA-LAD  . Aortic stenosis   . Tobacco abuse   . S/P aortic valve replacement 03/15/13    Mechanical   . SVT (supraventricular tachycardia) ([place]) 06/09/14   Past Surgical History  Procedure Laterality Date  . Liver transplant  10/25/2009    sees Dr Ferol Luz 1 every 6 months, saw last in Dec 2013. [name] Coord [phone]  .  Small intestine surgery  90's  . Thrombectomy    . Arteriovenous graft placement Left 10/03/10     forearm  . Avgg removal  12/23/2010    Procedure: REMOVAL OF ARTERIOVENOUS GORETEX GRAFT ([place]);  Surgeon: Elam Dutch, MD;  Location: [place];  Service: Vascular;  Laterality: Left;  procedure started @1736 [id number]  . Insertion of dialysis catheter  12/23/2010    Procedure: INSERTION OF DIALYSIS CATHETER;  Surgeon: Elam Dutch, MD;  Location: [place];  Service: Vascular;  Laterality: Right;  Right Internal Jugular 28cm dialysis catheter insertion procedure time 1701-1720   . Cholecystectomy  1993  . Cystoscopy  1990's  . Spinal growth rods  2010    "put 2 metal rods in my back; they had detetriorated" (08/30/2012)  . Av fistula placement  01/29/2011    Procedure: INSERTION OF  ARTERIOVENOUS (AV) GORE-TEX GRAFT ARM;  Surgeon: Elam Dutch, MD;  Location: [place];  Service: Vascular;  Laterality: Right;  . Av fistula placement  03/10/2011    Procedure: INSERTION OF ARTERIOVENOUS (AV) GORE-TEX GRAFT THIGH;  Surgeon: Elam Dutch, MD;  Location: [place];  Service: Vascular;  Laterality: Left;  . Tubal ligation  1990's  . Aortic valve replacement N/A 03/15/2013    AVR; Surgeon: Ivin Poot, MD;  Location: [facility] OR; Open Heart Surgery;  6mm[employer] mechanical prosthesis, top hat valve  . Coronary artery bypass graft N/A 03/15/2013    Procedure: CORONARY ARTERY BYPASS GRAFTING (CABG) times one using left internal mammary artery.;  Surgeon: Ivin Poot, MD;  Location: [place];  Service: Open Heart Surgery;  Laterality: N/A;  POSS CABG X 1  . Intraoperative transesophageal echocardiogram N/A 03/15/2013    Procedure: INTRAOPERATIVE TRANSESOPHAGEAL ECHOCARDIOGRAM;  Surgeon: Ivin Poot, MD;  Location: [place];  Service: Open Heart Surgery;  Laterality: N/A;  . Left heart catheterization with coronary/graft angiogram N/A 12/24/2011    Procedure: LEFT HEART CATHETERIZATION WITH Beatrix Fetters;  Surgeon: [name], MD;  Location: [facility] CATH LAB;  Service: Cardiovascular;  Laterality: N/A;  . Left heart catheterization with coronary angiogram N/A 07/29/2012    Procedure: LEFT HEART CATHETERIZATION WITH CORONARY ANGIOGRAM;  Surgeon: Troy Sine, MD;  Location: [facility] CATH LAB;  Service: Cardiovascular;  Laterality: N/A;  . Left heart catheterization with coronary angiogram N/A 03/10/2013    Procedure: LEFT HEART CATHETERIZATION WITH CORONARY ANGIOGRAM;  Surgeon: Troy Sine, MD;  Location: [facility] CATH LAB;  Service: Cardiovascular;  Laterality: N/A;  . Left heart catheterization with coronary/graft angiogram N/A 12/16/2013    Procedure: LEFT HEART CATHETERIZATION WITH Beatrix Fetters;  Surgeon: Troy Sine, MD;  Location: [facility] CATH LAB;  Service: Cardiovascular;   Laterality: N/A;  . Thrombectomy and revision of arterioventous (av) goretex  graft Left 03/30/2014  . Thrombectomy and revision of arterioventous (av) goretex  graft Left 03/30/2014    Procedure: THROMBECTOMY AND REVISION OF ARTERIOVENTOUS (AV) GORETEX  GRAFT;  Surgeon: Conrad Palomas, MD;  Location: [place];  Service: Vascular;  Laterality: Left;  . Shuntogram Left 05/15/2014    Procedure: SHUNTOGRAM;  Surgeon: Conrad Autauga, MD;  Location: [facility] CATH LAB;  Service: Cardiovascular;  Laterality: Left;   Family History  Problem Relation Age of Onset  . Cancer Mother   . Diabetes Mother   . Hypertension Mother   . Stroke Mother   . Cancer Father   . Anesthesia problems Neg Hx   . Hypotension Neg Hx   . Malignant hyperthermia  Neg Hx   . Pseudochol deficiency Neg Hx    Social History  Substance Use Topics  . Smoking status: Current Every Day Smoker -- 0.25 packs/day for 40 years    Types: Cigarettes  . Smokeless tobacco: Never Used     Comment: still smokng 1 or 2 a day  . Alcohol Use: No   OB History    No data available     Review of Systems  Constitutional: Negative for fever and chills.  Respiratory: Negative for cough and shortness of breath.   Cardiovascular: Negative for chest pain.  Gastrointestinal: Positive for nausea, vomiting and abdominal pain. Negative for diarrhea and blood in stool.  Musculoskeletal: Negative for back pain.  Neurological: Negative for light-headedness and headaches.  All other systems reviewed and are negative.     Allergies  Acetaminophen; Codeine; Mirtazapine; and Morphine  Home Medications   Prior to Admission medications   Medication Sig Start Date End Date Taking? Authorizing Provider  albuterol (PROVENTIL,VENTOLIN) 90 MCG/ACT inhaler Inhale 1-2 puffs into the lungs every 6 (six) hours as needed for wheezing or shortness of breath.    Yes Historical Provider, MD  aspirin EC 81 MG tablet Take 81 mg by mouth daily.    Yes Historical Provider, MD   benzonatate (TESSALON) 100 MG capsule Take 100 mg by mouth every 8 (eight) hours as needed for cough.   Yes Historical Provider, MD  insulin aspart (NOVOLOG) 100 UNIT/ML injection Inject 1-2 Units into the skin 3 (three) times daily as needed for high blood sugar (CBG>150). CBG < 150 = no insulin CBG 150-200 = 1 unit CBG 200-250 = 2 units   Yes Historical Provider, MD  ipratropium-albuterol (DUONEB) 0.5-2.5 (3) MG/3ML SOLN Take 3 mLs by nebulization every 6 (six) hours as needed. J44.1 code Patient taking differently: Take 3 mLs by nebulization every 6 (six) hours as needed (shortness of breath, wheezing). J44.1 code 08/31/14  Yes Theodis Blaze, MD  levothyroxine (SYNTHROID, LEVOTHROID) 150 MCG tablet Take 150 mcg by mouth daily before breakfast.   Yes Historical Provider, MD  LORazepam (ATIVAN) 0.5 MG tablet Take 0.5 mg by mouth every 6 (six) hours as needed (for nausea and vomiting).  07/02/15  Yes Historical Provider, MD  meclizine (ANTIVERT) 25 MG tablet Take 25 mg by mouth every 6 (six) hours as needed for dizziness.   Yes Historical Provider, MD  metoprolol tartrate (LOPRESSOR) 25 MG tablet Take 25 mg by mouth 2 (two) times daily.  03/16/14  Yes Historical Provider, MD  nitroGLYCERIN (NITROSTAT) 0.4 MG SL tablet Place 1 tablet (0.4 mg total) under the tongue every 5 (five) minutes as needed for chest pain. 03/20/14  Yes Silver Huguenin Elgergawy, MD  ondansetron (ZOFRAN) 4 MG tablet Take 4 mg by mouth every 8 (eight) hours as needed for nausea or vomiting.   Yes Historical Provider, MD  polyethylene glycol (MIRALAX / GLYCOLAX) packet Take 17 g by mouth daily as needed for mild constipation.    Yes Historical Provider, MD  promethazine (PHENERGAN) 12.5 MG tablet Take 12.5 mg by mouth every 6 (six) hours as needed for nausea or vomiting.   Yes Historical Provider, MD  SENSIPAR 60 MG tablet Take 60 mg by mouth daily.  03/04/15  Yes Historical Provider, MD  tacrolimus (PROGRAF) 1 MG capsule Take 1-2 mg by mouth  2 (two) times daily. 2 mg in the morning and 1 mg at bedtime   Yes Historical Provider, MD  traMADol (ULTRAM) 50 MG  tablet Take 1 tablet (50 mg total) by mouth every 12 (twelve) hours as needed for moderate pain. Patient taking differently: Take 25 mg by mouth every 12 (twelve) hours as needed for moderate pain.  08/30/14  Yes Theodis Blaze, MD  verapamil (CALAN) 120 MG tablet TAKE 1 TABLET (120 MG TOTAL) BY MOUTH AT BEDTIME. NEED OV. Patient taking differently: Take 1 tablet by mouth at bedtime 05/22/15  Yes Troy Sine, MD  warfarin (COUMADIN) 4 MG tablet Take 2-4 mg by mouth daily. 2 mg by mouth every evening on Sunday/Monday/Wednesday/Friday/Saturday and 4 mg on Tuesday/Thursday   Yes Historical Provider, MD   BP 187/61 mmHg  Pulse 91  Temp(Src) 98.1 F (36.7 C) (Oral)  Resp 20  SpO2 100% Physical Exam  Constitutional: She is oriented to person, place, and time. She appears well-developed and well-nourished. No distress.  HENT:  Head: Normocephalic and atraumatic.  Eyes: EOM are normal. Pupils are equal, round, and reactive to light.  Neck: Normal range of motion.  Cardiovascular: Regular rhythm.  Tachycardia present.   Pulmonary/Chest: No tachypnea. No respiratory distress. She has decreased breath sounds (left).  Abdominal: Soft. Normal appearance. She exhibits distension. She exhibits no fluid wave. There is tenderness in the left upper quadrant. There is no rigidity, no rebound, no guarding, no CVA tenderness, no tenderness at McBurney's point and negative Murphy's sign.  Neurological: She is alert and oriented to person, place, and time. GCS eye subscore is 4. GCS verbal subscore is 5. GCS motor subscore is 6.  Skin: Skin is warm and dry.    ED Course  Procedures (including critical care time) Labs Review Labs Reviewed  COMPREHENSIVE METABOLIC PANEL - Abnormal; Notable for the following:    BUN <5 (*)    Creatinine, Ser 2.87 (*)    Calcium 8.8 (*)    Albumin 2.1 (*)     ALT 10 (*)    Total Bilirubin 1.3 (*)    GFR calc non Af Amer 17 (*)    GFR calc Af Amer 19 (*)    All other components within normal limits  CBC WITH DIFFERENTIAL/PLATELET - Abnormal; Notable for the following:    WBC 12.8 (*)    Hemoglobin 11.3 (*)    HCT 34.8 (*)    Neutro Abs 9.3 (*)    Monocytes Absolute 1.4 (*)    Basophils Absolute 0.2 (*)    All other components within normal limits  BRAIN NATRIURETIC PEPTIDE - Abnormal; Notable for the following:    B Natriuretic Peptide 1495.0 (*)    All other components within normal limits  I-STAT CG4 LACTIC ACID, ED - Abnormal; Notable for the following:    Lactic Acid, Venous 2.09 (*)    All other components within normal limits  CULTURE, BLOOD (ROUTINE X 2)  CULTURE, BLOOD (ROUTINE X 2)  LIPASE, BLOOD  I-STAT CG4 LACTIC ACID, ED    Imaging Review Ct Abdomen Pelvis W Contrast  07/09/2015  CLINICAL DATA:  Abdominal pain. Dialysis patient. 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 Liver transplant years ago. EXAM: CT ABDOMEN AND PELVIS WITH CONTRAST TECHNIQUE: Multidetector CT imaging of the abdomen and pelvis was performed using the standard protocol following bolus administration of intravenous contrast. CONTRAST:  118mL ISOVUE-300 IOPAMIDOL (ISOVUE-300) INJECTION 61% COMPARISON:  CT abdomen pelvis 05/19/2015 FINDINGS: Lower chest: Moderately large left effusion has progressed in the interval. Left lower lobe compressive atelectasis. Small right effusion. Extensive coronary calcification. Heart size mildly enlarged. Hepatobiliary: Liver transplant. No focal liver  lesion or cirrhosis. Gallbladder absent. Bile ducts nondilated. Pancreas: Negative Spleen: The spleen was present on the prior study but is no longer visualized. There is a large fluid collection in the region of the spleen under the left hemidiaphragm with a small amount of residual spleen medially. I suspect splenic infarct and hematoma. There is a large fluid collection in the splenic bed  measuring 10 x 13 cm which was not present previously. This appears higher than the left nephrectomy bed hematoma seen on the prior study which has largely resolved. Adrenals/Urinary Tract: Left nephrectomy. Large hematoma with gas bubbles in the nephrectomy bed on the prior study has largely resolved. Small amount of fluid deep to the lateral abdominal wall has developed in the interval. Multiple small cysts in the right kidney without hydronephrosis or mass. Urinary bladder is empty. Stomach/Bowel: There is edema in the stomach and antrum suggesting gastritis. This was present previously. No bowel obstruction. Vascular/Lymphatic: Atherosclerotic aorta without aneurysm. Atherosclerotic iliac arteries and visceral arteries. Reproductive: Negative for mass.  Normal uterus. Other: No free fluid. Musculoskeletal: Lumbar fusion L4 through S1. Negative for fracture. No acute bony abnormality. IMPRESSION: Interval splenic infarction with large fluid collection in the splenic bed measuring 10 x 13 cm. Spleen appeared normal previously. Large hematoma in the left nephrectomy bed on the prior CT of 06/08/2015 has largely resolved. Mucosal edema of the stomach and antrum suggesting gastritis. Negative for bowel obstruction. Moderately large left pleural effusion has progressed in the interval with left lower lobe atelectasis. Small right effusion. Electronically Signed   By: Franchot Gallo M.D.   On: 07/09/2015 20:21   Dg Chest Portable 1 View  07/09/2015  CLINICAL DATA:  Short of breath. Dialysis patient. Chest pain. Liver transplant. EXAM: PORTABLE CHEST 1 VIEW COMPARISON:  06/07/2015 FINDINGS: Moderately large left pleural effusion has developed since the prior study. There is compressive atelectasis in the left lower lobe. Right lung is clear. Prior aortic valve replacement.  No pulmonary edema identified. IMPRESSION: Interval development of moderately large left pleural effusion with left lower lobe atelectasis.  Electronically Signed   By: Franchot Gallo M.D.   On: 07/09/2015 17:39   I have personally reviewed and evaluated these images and lab results as part of my medical decision-making.   EKG Interpretation   Date/Time:  Monday Jul 09 2015 16:47:00 EDT Ventricular Rate:  94 PR Interval:  149 QRS Duration: 94 QT Interval:  391 QTC Calculation: 489 R Axis:   14 Text Interpretation:  Sinus rhythm LVH with secondary repolarization  abnormality Anterior infarct, old No significant change since last tracing  Confirmed by ZACKOWSKI  MD, SCOTT [phone]) on 07/09/2015 5:01:11 PM      MDM   Final diagnoses:  Splenic infarction  S/p nephrectomy  ESRD (end stage renal disease) (HCC)  Pleural effusion    60 year old female with a cough located past medical history presenting with severe onset of abdominal pain and nausea and vomiting. History of end-stage renal disease, Monday Wednesday Friday. History of recent left-sided nephrectomy at [facility] after development of peri-renal hematoma with associated gas formation. Since discharge the patient has had continued weight loss as well as some abdominal discomfort. During dialysis today the patient had acute onset of severe worsening of her abdominal pain as well as nausea and vomiting. Denies any fevers and chills. Denies any other localizing infectious symptoms. No diarrhea. Only other complaint was shortness of breath.  On arrival here the patient is uncomfortable appearing. Afebrile. Mild  tachycardia as well as tachypnea. Hypertensive.  Patient did have a leukocytosis and slight lactic acidosis.. Given recent surgical procedure at the concern for infectious process. Gave the patient vancomycin and Zosyn for possible infection. Did not give full 30 mL/kg bolus given patient's history of end-stage renal disease and shortness of breath. Lactate was cleared on next draw.  Chest x-ray showed left-sided pleural effusion that was new from prior x-rays. CT  abdomen pelvis showed evidence of splenic infarction with large fluid collection.   Spoke with urology at [facility]. They are agreeable to have the patient transferred to the emergency department therefore their is evaluation. Discussed this with the patient. She is willing to be transferred at this time.  Patient transferred in stable condition.  Maryan Puls, MD 07/09/15 2240

## 2015-07-09 NOTE — ED Notes (Signed)
CT contacted for copy of imaging

## 2015-07-09 NOTE — ED Notes (Signed)
MD Verlene Mayer at bedside to site ultrasound IV

## 2015-07-09 NOTE — ED Notes (Signed)
Pt arrives via [place] [facility] with c/o abdominal pain and vomiting. Ems reports pt had full dialysis treatment this am. Pt took 4mg  zofran pta.

## 2015-07-09 NOTE — ED Notes (Signed)
Dr. Verlene Mayer notified that patient's family reported to this RN that patient had recent kidney removal on May 8th.

## 2015-07-09 NOTE — Progress Notes (Signed)
Pharmacy Antibiotic Note  [name] is a 60 y.o. female admitted on 07/09/2015 with sepsis, presenting with abdominal pain and vomiting. Patient is ESRD on HD-TTS and recently had a kidney removed on May 8th. Last HD session was today (full treatment). Pharmacy has been consulted for vancomycin and Zosyn dosing.  WBC 12.8, afebrile, BP 180s/50s, HR 80s, Lactic acid 2.09  Plan: --Vancomycin 1000 mg IV x1 (~16 mg/kg). Goal trough 15-20 mcg/mL. --Zosyn 3.375 g IV x 1 over 30 minutes, then 2.25 g IV q8h  --follow-up HD schedule for further vancomycin doses --f/u cultures, LOT, trough at [facility]    Temp (24hrs), Avg:98.6 F (37 C), Min:98.6 F (37 C), Max:98.6 F (37 C)   Recent Labs Lab 07/09/15 1728 07/09/15 1733  WBC  --  12.8*  CREATININE  --  2.87*  LATICACIDVEN 2.09*  --     Estimated Creatinine Clearance: 18.4 mL/min (by C-G formula based on Cr of 2.87).    Allergies  Allergen Reactions  . Acetaminophen Other (See Comments)    Liver transplant recipient   . Codeine Itching  . Mirtazapine Other (See Comments)    hallucination  . Morphine Itching and Other (See Comments)    hallucinate    Antimicrobials this admission: 5/29 vancomycin >>  5/29 Zosyn >>  Dose adjustments this admission: n/a  Microbiology results: pending  Thank you for allowing pharmacy to be a part of this patient's care.  Governor Specking, PharmD Clinical Pharmacy Resident Pager: [phone] 07/09/2015 7:48 PM

## 2015-07-09 NOTE — ED Notes (Signed)
MD at bedside. 

## 2015-07-09 NOTE — ED Notes (Signed)
[facility] contacted to transport patient to [facility]

## 2015-07-09 NOTE — ED Notes (Signed)
Pt at CT

## 2015-07-10 DIAGNOSIS — Z7901 Long term (current) use of anticoagulants: Secondary | ICD-10-CM | POA: Diagnosis not present

## 2015-07-10 DIAGNOSIS — Z7982 Long term (current) use of aspirin: Secondary | ICD-10-CM | POA: Diagnosis not present

## 2015-07-10 DIAGNOSIS — C642 Malignant neoplasm of left kidney, except renal pelvis: Secondary | ICD-10-CM | POA: Diagnosis not present

## 2015-07-10 DIAGNOSIS — D7832 Postprocedural hematoma of the spleen following other procedure: Secondary | ICD-10-CM | POA: Diagnosis not present

## 2015-07-10 DIAGNOSIS — F17210 Nicotine dependence, cigarettes, uncomplicated: Secondary | ICD-10-CM | POA: Diagnosis present

## 2015-07-10 DIAGNOSIS — I129 Hypertensive chronic kidney disease with stage 1 through stage 4 chronic kidney disease, or unspecified chronic kidney disease: Secondary | ICD-10-CM | POA: Diagnosis not present

## 2015-07-10 DIAGNOSIS — E785 Hyperlipidemia, unspecified: Secondary | ICD-10-CM | POA: Diagnosis present

## 2015-07-10 DIAGNOSIS — Z944 Liver transplant status: Secondary | ICD-10-CM | POA: Diagnosis not present

## 2015-07-10 DIAGNOSIS — N186 End stage renal disease: Secondary | ICD-10-CM | POA: Diagnosis not present

## 2015-07-10 DIAGNOSIS — Z79899 Other long term (current) drug therapy: Secondary | ICD-10-CM | POA: Diagnosis not present

## 2015-07-10 DIAGNOSIS — Z952 Presence of prosthetic heart valve: Secondary | ICD-10-CM | POA: Diagnosis not present

## 2015-07-10 DIAGNOSIS — D735 Infarction of spleen: Secondary | ICD-10-CM | POA: Diagnosis not present

## 2015-07-10 DIAGNOSIS — Z905 Acquired absence of kidney: Secondary | ICD-10-CM | POA: Diagnosis not present

## 2015-07-10 DIAGNOSIS — D631 Anemia in chronic kidney disease: Secondary | ICD-10-CM | POA: Diagnosis present

## 2015-07-10 DIAGNOSIS — T8640 Unspecified complication of liver transplant: Secondary | ICD-10-CM | POA: Diagnosis not present

## 2015-07-10 DIAGNOSIS — I120 Hypertensive chronic kidney disease with stage 5 chronic kidney disease or end stage renal disease: Secondary | ICD-10-CM | POA: Diagnosis not present

## 2015-07-10 DIAGNOSIS — Z992 Dependence on renal dialysis: Secondary | ICD-10-CM | POA: Diagnosis not present

## 2015-07-10 DIAGNOSIS — I132 Hypertensive heart and chronic kidney disease with heart failure and with stage 5 chronic kidney disease, or end stage renal disease: Secondary | ICD-10-CM | POA: Diagnosis not present

## 2015-07-10 DIAGNOSIS — I509 Heart failure, unspecified: Secondary | ICD-10-CM | POA: Diagnosis present

## 2015-07-10 DIAGNOSIS — E039 Hypothyroidism, unspecified: Secondary | ICD-10-CM | POA: Diagnosis present

## 2015-07-10 DIAGNOSIS — K661 Hemoperitoneum: Secondary | ICD-10-CM | POA: Diagnosis not present

## 2015-07-10 DIAGNOSIS — R935 Abnormal findings on diagnostic imaging of other abdominal regions, including retroperitoneum: Secondary | ICD-10-CM | POA: Diagnosis not present

## 2015-07-10 DIAGNOSIS — E876 Hypokalemia: Secondary | ICD-10-CM | POA: Diagnosis present

## 2015-07-11 DIAGNOSIS — T8640 Unspecified complication of liver transplant: Secondary | ICD-10-CM | POA: Diagnosis not present

## 2015-07-11 DIAGNOSIS — N186 End stage renal disease: Secondary | ICD-10-CM | POA: Diagnosis not present

## 2015-07-11 DIAGNOSIS — Z992 Dependence on renal dialysis: Secondary | ICD-10-CM | POA: Diagnosis not present

## 2015-07-11 LAB — PROTIME-INR: INR: 2.6 — AB (ref <=1.1)

## 2015-07-13 ENCOUNTER — Ambulatory Visit (INDEPENDENT_AMBULATORY_CARE_PROVIDER_SITE_OTHER): Payer: Medicare Other | Admitting: Pharmacist

## 2015-07-13 DIAGNOSIS — Z954 Presence of other heart-valve replacement: Secondary | ICD-10-CM

## 2015-07-13 DIAGNOSIS — Z7901 Long term (current) use of anticoagulants: Secondary | ICD-10-CM

## 2015-07-13 DIAGNOSIS — Z952 Presence of prosthetic heart valve: Secondary | ICD-10-CM

## 2015-07-14 LAB — CULTURE, BLOOD (ROUTINE X 2)
CULTURE: NO GROWTH
Culture: NO GROWTH

## 2015-07-18 DIAGNOSIS — N2581 Secondary hyperparathyroidism of renal origin: Secondary | ICD-10-CM | POA: Diagnosis not present

## 2015-07-18 DIAGNOSIS — E118 Type 2 diabetes mellitus with unspecified complications: Secondary | ICD-10-CM | POA: Diagnosis not present

## 2015-07-18 DIAGNOSIS — N186 End stage renal disease: Secondary | ICD-10-CM | POA: Diagnosis not present

## 2015-07-18 DIAGNOSIS — E876 Hypokalemia: Secondary | ICD-10-CM | POA: Diagnosis not present

## 2015-07-18 DIAGNOSIS — Z7901 Long term (current) use of anticoagulants: Secondary | ICD-10-CM | POA: Diagnosis not present

## 2015-07-18 DIAGNOSIS — D631 Anemia in chronic kidney disease: Secondary | ICD-10-CM | POA: Diagnosis not present

## 2015-07-18 LAB — PROTIME-INR: INR: 2 — AB (ref 0.9–1.1)

## 2015-07-19 ENCOUNTER — Encounter: Payer: Self-pay | Admitting: Cardiovascular Disease

## 2015-07-19 ENCOUNTER — Ambulatory Visit (INDEPENDENT_AMBULATORY_CARE_PROVIDER_SITE_OTHER): Payer: Medicare Other | Admitting: Pharmacist Clinician (PhC)/ Clinical Pharmacy Specialist

## 2015-07-19 DIAGNOSIS — Z7901 Long term (current) use of anticoagulants: Secondary | ICD-10-CM

## 2015-07-19 DIAGNOSIS — Z952 Presence of prosthetic heart valve: Secondary | ICD-10-CM

## 2015-07-19 DIAGNOSIS — Z954 Presence of other heart-valve replacement: Secondary | ICD-10-CM

## 2015-07-20 DIAGNOSIS — E118 Type 2 diabetes mellitus with unspecified complications: Secondary | ICD-10-CM | POA: Diagnosis not present

## 2015-07-20 DIAGNOSIS — N2581 Secondary hyperparathyroidism of renal origin: Secondary | ICD-10-CM | POA: Diagnosis not present

## 2015-07-20 DIAGNOSIS — D631 Anemia in chronic kidney disease: Secondary | ICD-10-CM | POA: Diagnosis not present

## 2015-07-20 DIAGNOSIS — E876 Hypokalemia: Secondary | ICD-10-CM | POA: Diagnosis not present

## 2015-07-20 DIAGNOSIS — N186 End stage renal disease: Secondary | ICD-10-CM | POA: Diagnosis not present

## 2015-07-23 DIAGNOSIS — N2581 Secondary hyperparathyroidism of renal origin: Secondary | ICD-10-CM | POA: Diagnosis not present

## 2015-07-23 DIAGNOSIS — E118 Type 2 diabetes mellitus with unspecified complications: Secondary | ICD-10-CM | POA: Diagnosis not present

## 2015-07-23 DIAGNOSIS — D631 Anemia in chronic kidney disease: Secondary | ICD-10-CM | POA: Diagnosis not present

## 2015-07-23 DIAGNOSIS — E876 Hypokalemia: Secondary | ICD-10-CM | POA: Diagnosis not present

## 2015-07-23 DIAGNOSIS — N186 End stage renal disease: Secondary | ICD-10-CM | POA: Diagnosis not present

## 2015-07-25 DIAGNOSIS — R262 Difficulty in walking, not elsewhere classified: Secondary | ICD-10-CM | POA: Diagnosis not present

## 2015-07-26 DIAGNOSIS — N2581 Secondary hyperparathyroidism of renal origin: Secondary | ICD-10-CM | POA: Diagnosis not present

## 2015-07-26 DIAGNOSIS — E118 Type 2 diabetes mellitus with unspecified complications: Secondary | ICD-10-CM | POA: Diagnosis not present

## 2015-07-26 DIAGNOSIS — D631 Anemia in chronic kidney disease: Secondary | ICD-10-CM | POA: Diagnosis not present

## 2015-07-26 DIAGNOSIS — I359 Nonrheumatic aortic valve disorder, unspecified: Secondary | ICD-10-CM | POA: Diagnosis not present

## 2015-07-26 DIAGNOSIS — E876 Hypokalemia: Secondary | ICD-10-CM | POA: Diagnosis not present

## 2015-07-26 DIAGNOSIS — N186 End stage renal disease: Secondary | ICD-10-CM | POA: Diagnosis not present

## 2015-07-26 LAB — PROTIME-INR: INR: 2.3 — AB (ref 0.9–1.1)

## 2015-07-27 DIAGNOSIS — D631 Anemia in chronic kidney disease: Secondary | ICD-10-CM | POA: Diagnosis not present

## 2015-07-27 DIAGNOSIS — K2970 Gastritis, unspecified, without bleeding: Secondary | ICD-10-CM | POA: Diagnosis not present

## 2015-07-27 DIAGNOSIS — R112 Nausea with vomiting, unspecified: Secondary | ICD-10-CM | POA: Diagnosis not present

## 2015-07-27 DIAGNOSIS — E118 Type 2 diabetes mellitus with unspecified complications: Secondary | ICD-10-CM | POA: Diagnosis not present

## 2015-07-27 DIAGNOSIS — R1084 Generalized abdominal pain: Secondary | ICD-10-CM | POA: Diagnosis not present

## 2015-07-27 DIAGNOSIS — N186 End stage renal disease: Secondary | ICD-10-CM | POA: Diagnosis not present

## 2015-07-27 DIAGNOSIS — E876 Hypokalemia: Secondary | ICD-10-CM | POA: Diagnosis not present

## 2015-07-27 DIAGNOSIS — N2581 Secondary hyperparathyroidism of renal origin: Secondary | ICD-10-CM | POA: Diagnosis not present

## 2015-07-30 DIAGNOSIS — N186 End stage renal disease: Secondary | ICD-10-CM | POA: Diagnosis not present

## 2015-07-30 DIAGNOSIS — D631 Anemia in chronic kidney disease: Secondary | ICD-10-CM | POA: Diagnosis not present

## 2015-07-30 DIAGNOSIS — E876 Hypokalemia: Secondary | ICD-10-CM | POA: Diagnosis not present

## 2015-07-30 DIAGNOSIS — N2581 Secondary hyperparathyroidism of renal origin: Secondary | ICD-10-CM | POA: Diagnosis not present

## 2015-07-30 DIAGNOSIS — E118 Type 2 diabetes mellitus with unspecified complications: Secondary | ICD-10-CM | POA: Diagnosis not present

## 2015-08-01 DIAGNOSIS — E118 Type 2 diabetes mellitus with unspecified complications: Secondary | ICD-10-CM | POA: Diagnosis not present

## 2015-08-01 DIAGNOSIS — D631 Anemia in chronic kidney disease: Secondary | ICD-10-CM | POA: Diagnosis not present

## 2015-08-01 DIAGNOSIS — N2581 Secondary hyperparathyroidism of renal origin: Secondary | ICD-10-CM | POA: Diagnosis not present

## 2015-08-01 DIAGNOSIS — I359 Nonrheumatic aortic valve disorder, unspecified: Secondary | ICD-10-CM | POA: Diagnosis not present

## 2015-08-01 DIAGNOSIS — N186 End stage renal disease: Secondary | ICD-10-CM | POA: Diagnosis not present

## 2015-08-01 DIAGNOSIS — E876 Hypokalemia: Secondary | ICD-10-CM | POA: Diagnosis not present

## 2015-08-02 ENCOUNTER — Ambulatory Visit (INDEPENDENT_AMBULATORY_CARE_PROVIDER_SITE_OTHER): Payer: Medicare Other | Admitting: Pharmacist Clinician (PhC)/ Clinical Pharmacy Specialist

## 2015-08-02 DIAGNOSIS — Z954 Presence of other heart-valve replacement: Secondary | ICD-10-CM

## 2015-08-02 DIAGNOSIS — Z952 Presence of prosthetic heart valve: Secondary | ICD-10-CM

## 2015-08-02 DIAGNOSIS — Z7901 Long term (current) use of anticoagulants: Secondary | ICD-10-CM

## 2015-08-02 LAB — PROTIME-INR: INR: 2.5 — AB (ref 0.9–1.1)

## 2015-08-03 DIAGNOSIS — D631 Anemia in chronic kidney disease: Secondary | ICD-10-CM | POA: Diagnosis not present

## 2015-08-03 DIAGNOSIS — N186 End stage renal disease: Secondary | ICD-10-CM | POA: Diagnosis not present

## 2015-08-03 DIAGNOSIS — E118 Type 2 diabetes mellitus with unspecified complications: Secondary | ICD-10-CM | POA: Diagnosis not present

## 2015-08-03 DIAGNOSIS — N2581 Secondary hyperparathyroidism of renal origin: Secondary | ICD-10-CM | POA: Diagnosis not present

## 2015-08-03 DIAGNOSIS — E876 Hypokalemia: Secondary | ICD-10-CM | POA: Diagnosis not present

## 2015-08-06 ENCOUNTER — Ambulatory Visit (INDEPENDENT_AMBULATORY_CARE_PROVIDER_SITE_OTHER): Payer: Medicare Other | Admitting: Pharmacist Clinician (PhC)/ Clinical Pharmacy Specialist

## 2015-08-06 DIAGNOSIS — E876 Hypokalemia: Secondary | ICD-10-CM | POA: Diagnosis not present

## 2015-08-06 DIAGNOSIS — Z952 Presence of prosthetic heart valve: Secondary | ICD-10-CM

## 2015-08-06 DIAGNOSIS — Z954 Presence of other heart-valve replacement: Secondary | ICD-10-CM

## 2015-08-06 DIAGNOSIS — E118 Type 2 diabetes mellitus with unspecified complications: Secondary | ICD-10-CM | POA: Diagnosis not present

## 2015-08-06 DIAGNOSIS — N2581 Secondary hyperparathyroidism of renal origin: Secondary | ICD-10-CM | POA: Diagnosis not present

## 2015-08-06 DIAGNOSIS — N186 End stage renal disease: Secondary | ICD-10-CM | POA: Diagnosis not present

## 2015-08-06 DIAGNOSIS — Z7901 Long term (current) use of anticoagulants: Secondary | ICD-10-CM

## 2015-08-06 DIAGNOSIS — D631 Anemia in chronic kidney disease: Secondary | ICD-10-CM | POA: Diagnosis not present

## 2015-08-08 DIAGNOSIS — N2581 Secondary hyperparathyroidism of renal origin: Secondary | ICD-10-CM | POA: Diagnosis not present

## 2015-08-08 DIAGNOSIS — N186 End stage renal disease: Secondary | ICD-10-CM | POA: Diagnosis not present

## 2015-08-08 DIAGNOSIS — E118 Type 2 diabetes mellitus with unspecified complications: Secondary | ICD-10-CM | POA: Diagnosis not present

## 2015-08-08 DIAGNOSIS — E876 Hypokalemia: Secondary | ICD-10-CM | POA: Diagnosis not present

## 2015-08-08 DIAGNOSIS — D631 Anemia in chronic kidney disease: Secondary | ICD-10-CM | POA: Diagnosis not present

## 2015-08-10 DIAGNOSIS — D631 Anemia in chronic kidney disease: Secondary | ICD-10-CM | POA: Diagnosis not present

## 2015-08-10 DIAGNOSIS — E118 Type 2 diabetes mellitus with unspecified complications: Secondary | ICD-10-CM | POA: Diagnosis not present

## 2015-08-10 DIAGNOSIS — Z992 Dependence on renal dialysis: Secondary | ICD-10-CM | POA: Diagnosis not present

## 2015-08-10 DIAGNOSIS — N2581 Secondary hyperparathyroidism of renal origin: Secondary | ICD-10-CM | POA: Diagnosis not present

## 2015-08-10 DIAGNOSIS — T8640 Unspecified complication of liver transplant: Secondary | ICD-10-CM | POA: Diagnosis not present

## 2015-08-10 DIAGNOSIS — N186 End stage renal disease: Secondary | ICD-10-CM | POA: Diagnosis not present

## 2015-08-10 DIAGNOSIS — E876 Hypokalemia: Secondary | ICD-10-CM | POA: Diagnosis not present

## 2015-08-12 ENCOUNTER — Emergency Department (HOSPITAL_COMMUNITY)
Admission: EM | Admit: 2015-08-12 | Discharge: 2015-08-13 | Disposition: A | Discharge status: Home / Self Care | Payer: Medicare Other | Attending: Emergency Medicine | Admitting: Emergency Medicine

## 2015-08-12 ENCOUNTER — Encounter (HOSPITAL_COMMUNITY): Payer: Self-pay | Admitting: *Deleted

## 2015-08-12 DIAGNOSIS — R109 Unspecified abdominal pain: Secondary | ICD-10-CM | POA: Diagnosis present

## 2015-08-12 DIAGNOSIS — J449 Chronic obstructive pulmonary disease, unspecified: Secondary | ICD-10-CM | POA: Diagnosis not present

## 2015-08-12 DIAGNOSIS — Z992 Dependence on renal dialysis: Secondary | ICD-10-CM | POA: Insufficient documentation

## 2015-08-12 DIAGNOSIS — R1012 Left upper quadrant pain: Secondary | ICD-10-CM | POA: Diagnosis not present

## 2015-08-12 DIAGNOSIS — R1013 Epigastric pain: Secondary | ICD-10-CM | POA: Diagnosis not present

## 2015-08-12 DIAGNOSIS — Z79899 Other long term (current) drug therapy: Secondary | ICD-10-CM | POA: Insufficient documentation

## 2015-08-12 DIAGNOSIS — Z951 Presence of aortocoronary bypass graft: Secondary | ICD-10-CM | POA: Insufficient documentation

## 2015-08-12 DIAGNOSIS — N186 End stage renal disease: Secondary | ICD-10-CM | POA: Insufficient documentation

## 2015-08-12 DIAGNOSIS — F17210 Nicotine dependence, cigarettes, uncomplicated: Secondary | ICD-10-CM | POA: Diagnosis not present

## 2015-08-12 DIAGNOSIS — I120 Hypertensive chronic kidney disease with stage 5 chronic kidney disease or end stage renal disease: Secondary | ICD-10-CM | POA: Diagnosis not present

## 2015-08-12 DIAGNOSIS — I2510 Atherosclerotic heart disease of native coronary artery without angina pectoris: Secondary | ICD-10-CM | POA: Diagnosis not present

## 2015-08-12 DIAGNOSIS — D735 Infarction of spleen: Secondary | ICD-10-CM | POA: Diagnosis not present

## 2015-08-12 DIAGNOSIS — E1122 Type 2 diabetes mellitus with diabetic chronic kidney disease: Secondary | ICD-10-CM | POA: Diagnosis not present

## 2015-08-12 DIAGNOSIS — Z7901 Long term (current) use of anticoagulants: Secondary | ICD-10-CM | POA: Insufficient documentation

## 2015-08-12 DIAGNOSIS — Z794 Long term (current) use of insulin: Secondary | ICD-10-CM | POA: Insufficient documentation

## 2015-08-12 LAB — CBC
HEMATOCRIT: 32.2 % — AB (ref 36.0–46.0)
Hemoglobin: 11 g/dL — ABNORMAL LOW (ref 12.0–15.0)
MCH: 29.1 pg (ref 26.0–34.0)
MCHC: 34.2 g/dL (ref 30.0–36.0)
MCV: 85.2 fL (ref 78.0–100.0)
Platelets: 205 10*3/uL (ref 150–400)
RBC: 3.78 MIL/uL — AB (ref 3.87–5.11)
RDW: 17.3 % — AB (ref 11.5–15.5)
WBC: 8.3 10*3/uL (ref 4.0–10.5)

## 2015-08-12 MED ORDER — ONDANSETRON HCL 4 MG/2ML IJ SOLN
4.0000 mg | Freq: Once | INTRAMUSCULAR | Status: AC
  Administered 2015-08-13: 4 mg via INTRAVENOUS
  Filled 2015-08-12: qty 2

## 2015-08-12 MED ORDER — HYDROMORPHONE HCL 1 MG/ML IJ SOLN
1.0000 mg | INTRAMUSCULAR | Status: DC | PRN
  Administered 2015-08-13: 1 mg via INTRAVENOUS
  Filled 2015-08-12: qty 1

## 2015-08-12 MED ORDER — SODIUM CHLORIDE 0.9 % IV BOLUS (SEPSIS)
500.0000 mL | Freq: Once | INTRAVENOUS | Status: AC
  Administered 2015-08-13: 500 mL via INTRAVENOUS

## 2015-08-12 NOTE — ED Notes (Signed)
MD at bedside. 

## 2015-08-12 NOTE — ED Provider Notes (Signed)
CSN: [id number]     Arrival date & time 08/12/15  2301 History  By signing my name below, I, [name], attest that this documentation has been prepared under the direction and in the presence of Tanna Furry, MD. Electronically Signed: Altamease [name], ED Scribe. 08/12/2015. 11:55 PM   Chief Complaint  Patient presents with  . Abdominal Pain    The history is provided by the patient. No language interpreter was used.   [name] is a 60 y.o. female with PMHx of GERD, hepatitis C and cirrhosis s/p liver transplant, ESRD on dialysis, DM, HTN, chronic pain, and chronic constipation who presents to the Emergency Department complaining of constant, 10/10 in severity, left sided abdominal pain with onset 1-2 weeks ago. She states that her pain was a little better when she left the hospital after her most recent admission but has been worsening since. She ran out of hydrocodone last week. Associated symptoms include nausea, vomiting, and SOB ("when I get to excited"). She has not missed any dialysis treatments.   Past Medical History  Diagnosis Date  . S/P liver transplant ([place])     2011 at [facility] (cirrhosis due to hep C, got hep C from blood transfuion in 1980's per pt))  . Chronic back pain   . CAD (coronary artery disease)   . Obesity   . Peripheral vascular disease (HCC) hands and legs  . Anxiety   . Asthma   . GERD (gastroesophageal reflux disease)     takes Omeprazole daily  . Chronic constipation     takes MIralax and Colace daily  . Anemia     takes Folic Acid daily  . Hypothyroidism     takes Synthroid daily  . Depression     takes Cymbalta for "severe" depression  . Neuromuscular disorder ([place])     carpal tunnel in right hand  . Hypertension     takes Metoprolol and Lisinopril daily, sees Dr Bea Graff  . COPD (chronic obstructive pulmonary disease) ([place])   . Pneumonia     "today and several times before" (08/30/2012)  . Chronic bronchitis ([place])     "q yr w/season  changes" (08/30/2012)  . Type II diabetes mellitus (HCC)     Levemir 2units daily if > 150  . History of blood transfusion     "several" (08/30/2012)  . Hepatitis C   . Migraine     "last migraine was in 2013" (08/30/2012)  . Headache     "at least monthly" (08/30/2012)  . Arthritis     "left hand, back" (08/30/2012)  . End stage renal disease on dialysis [facility]) 02/27/2011    Kidneys shut down at time of liver transplant in Sept 2011 at [facility] in [place], she has been on HD ever since.  Dialyzes at [facility] HD on TTS schedule.  Had L forearm graft used 10 months then removed Dec 2012 due to suspected infection.  A right upper arm AVG was placed Dec 2012 but she developed steal symptoms acutely and it was ligated the same day.  Never had an AV fistula due to small veins.  Now has L thigh AVG put in Jan 2013, has not clotted to date.    [name] CAD (coronary artery disease) Jan. 2015    Cath: 20% LAD, 50% D1; s/p LIMA-LAD  . Aortic stenosis   . Tobacco abuse   . S/P aortic valve replacement 03/15/13    Mechanical   . SVT (supraventricular tachycardia) ([place]) 06/09/14  Past Surgical History  Procedure Laterality Date  . Liver transplant  10/25/2009    sees Dr Ferol Luz 1 every 6 months, saw last in Dec 2013. [name] Coord [phone]  . Small intestine surgery  90's  . Thrombectomy    . Arteriovenous graft placement Left 10/03/10     forearm  . Avgg removal  12/23/2010    Procedure: REMOVAL OF ARTERIOVENOUS GORETEX GRAFT ([place]);  Surgeon: Elam Dutch, MD;  Location: [place];  Service: Vascular;  Laterality: Left;  procedure started @1736 -1852  . Insertion of dialysis catheter  12/23/2010    Procedure: INSERTION OF DIALYSIS CATHETER;  Surgeon: Elam Dutch, MD;  Location: [place];  Service: Vascular;  Laterality: Right;  Right Internal Jugular 28cm dialysis catheter insertion procedure time 1701-1720   . Cholecystectomy  1993  . Cystoscopy  1990's  . Spinal growth rods  2010    "put 2  metal rods in my back; they had detetriorated" (08/30/2012)  . Av fistula placement  01/29/2011    Procedure: INSERTION OF ARTERIOVENOUS (AV) GORE-TEX GRAFT ARM;  Surgeon: Elam Dutch, MD;  Location: [place];  Service: Vascular;  Laterality: Right;  . Av fistula placement  03/10/2011    Procedure: INSERTION OF ARTERIOVENOUS (AV) GORE-TEX GRAFT THIGH;  Surgeon: Elam Dutch, MD;  Location: [place];  Service: Vascular;  Laterality: Left;  . Tubal ligation  1990's  . Aortic valve replacement N/A 03/15/2013    AVR; Surgeon: Ivin Poot, MD;  Location: [facility] OR; Open Heart Surgery;  64mm[employer] mechanical prosthesis, top hat valve  . Coronary artery bypass graft N/A 03/15/2013    Procedure: CORONARY ARTERY BYPASS GRAFTING (CABG) times one using left internal mammary artery.;  Surgeon: Ivin Poot, MD;  Location: [place];  Service: Open Heart Surgery;  Laterality: N/A;  POSS CABG X 1  . Intraoperative transesophageal echocardiogram N/A 03/15/2013    Procedure: INTRAOPERATIVE TRANSESOPHAGEAL ECHOCARDIOGRAM;  Surgeon: Ivin Poot, MD;  Location: [place];  Service: Open Heart Surgery;  Laterality: N/A;  . Left heart catheterization with coronary/graft angiogram N/A 12/24/2011    Procedure: LEFT HEART CATHETERIZATION WITH Beatrix Fetters;  Surgeon: [name], MD;  Location: [facility] CATH LAB;  Service: Cardiovascular;  Laterality: N/A;  . Left heart catheterization with coronary angiogram N/A 07/29/2012    Procedure: LEFT HEART CATHETERIZATION WITH CORONARY ANGIOGRAM;  Surgeon: Troy Sine, MD;  Location: [facility] CATH LAB;  Service: Cardiovascular;  Laterality: N/A;  . Left heart catheterization with coronary angiogram N/A 03/10/2013    Procedure: LEFT HEART CATHETERIZATION WITH CORONARY ANGIOGRAM;  Surgeon: Troy Sine, MD;  Location: [facility] CATH LAB;  Service: Cardiovascular;  Laterality: N/A;  . Left heart catheterization with coronary/graft angiogram N/A 12/16/2013    Procedure: LEFT HEART  CATHETERIZATION WITH Beatrix Fetters;  Surgeon: Troy Sine, MD;  Location: [facility] CATH LAB;  Service: Cardiovascular;  Laterality: N/A;  . Thrombectomy and revision of arterioventous (av) goretex  graft Left 03/30/2014  . Thrombectomy and revision of arterioventous (av) goretex  graft Left 03/30/2014    Procedure: THROMBECTOMY AND REVISION OF ARTERIOVENTOUS (AV) GORETEX  GRAFT;  Surgeon: Conrad Nemaha, MD;  Location: [place];  Service: Vascular;  Laterality: Left;  . Shuntogram Left 05/15/2014    Procedure: SHUNTOGRAM;  Surgeon: Conrad Dasher, MD;  Location: [facility] CATH LAB;  Service: Cardiovascular;  Laterality: Left;   Family History  Problem Relation Age of Onset  . Cancer Mother   . Diabetes  Mother   . Hypertension Mother   . Stroke Mother   . Cancer Father   . Anesthesia problems Neg Hx   . Hypotension Neg Hx   . Malignant hyperthermia Neg Hx   . Pseudochol deficiency Neg Hx    Social History  Substance Use Topics  . Smoking status: Current Every Day Smoker -- 0.25 packs/day for 40 years    Types: Cigarettes  . Smokeless tobacco: Never Used     Comment: still smokng 1 or 2 a day  . Alcohol Use: No   OB History    No data available     Review of Systems  Constitutional: Negative for fever, chills, diaphoresis, appetite change and fatigue.  HENT: Negative for mouth sores, sore throat and trouble swallowing.   Eyes: Negative for visual disturbance.  Respiratory: Positive for shortness of breath. Negative for cough, chest tightness and wheezing.   Cardiovascular: Negative for chest pain.  Gastrointestinal: Positive for nausea, vomiting and abdominal pain. Negative for diarrhea and abdominal distention.  Endocrine: Negative for polydipsia, polyphagia and polyuria.  Genitourinary: Negative for dysuria, frequency and hematuria.  Musculoskeletal: Negative for gait problem.  Skin: Negative for color change, pallor and rash.  Neurological: Negative for dizziness, syncope,  light-headedness and headaches.  Hematological: Does not bruise/bleed easily.  Psychiatric/Behavioral: Negative for behavioral problems and confusion.   Allergies  Acetaminophen; Codeine; Mirtazapine; and Morphine  Home Medications   Prior to Admission medications   Medication Sig Start Date End Date Taking? Authorizing Provider  insulin aspart (NOVOLOG) 100 UNIT/ML injection Inject 1-2 Units into the skin 3 (three) times daily as needed for high blood sugar (CBG>150). CBG < 150 = no insulin CBG 150-200 = 1 unit CBG 200-250 = 2 units   Yes Historical Provider, MD  levothyroxine (SYNTHROID, LEVOTHROID) 150 MCG tablet Take 150 mcg by mouth daily before breakfast.   Yes Historical Provider, MD  LORazepam (ATIVAN) 0.5 MG tablet Take 0.5 mg by mouth every 6 (six) hours as needed (for nausea and vomiting).  07/02/15  Yes Historical Provider, MD  metoCLOPramide (REGLAN) 10 MG tablet Take 10 mg by mouth 3 (three) times daily before meals.   Yes Historical Provider, MD  metoprolol tartrate (LOPRESSOR) 25 MG tablet Take 25 mg by mouth 2 (two) times daily.  03/16/14  Yes Historical Provider, MD  nitroGLYCERIN (NITROSTAT) 0.4 MG SL tablet Place 1 tablet (0.4 mg total) under the tongue every 5 (five) minutes as needed for chest pain. 03/20/14  Yes Albertine Patricia, MD  promethazine (PHENERGAN) 12.5 MG tablet Take 12.5 mg by mouth every 6 (six) hours as needed for nausea or vomiting.   Yes Historical Provider, MD  senna-docusate (SENOKOT-S) 8.6-50 MG tablet Take 1 tablet by mouth daily.   Yes Historical Provider, MD  tacrolimus (PROGRAF) 1 MG capsule Take 3 mg by mouth 2 (two) times daily.    Yes Historical Provider, MD  warfarin (COUMADIN) 4 MG tablet Take 2 mg by mouth daily.    Yes Historical Provider, MD  ipratropium-albuterol (DUONEB) 0.5-2.5 (3) MG/3ML SOLN Take 3 mLs by nebulization every 6 (six) hours as needed. J44.1 code Patient not taking: Reported on 08/13/2015 08/31/14   Theodis Blaze, MD   ondansetron (ZOFRAN ODT) 4 MG disintegrating tablet Take 1 tablet (4 mg total) by mouth every 8 (eight) hours as needed for nausea. 08/13/15   Tanna Furry, MD  oxyCODONE (ROXICODONE) 5 MG immediate release tablet Take 1 tablet (5 mg total) by mouth every  4 (four) hours as needed for severe pain. 08/13/15   Tanna Furry, MD  traMADol (ULTRAM) 50 MG tablet Take 1 tablet (50 mg total) by mouth every 12 (twelve) hours as needed for moderate pain. Patient not taking: Reported on 08/13/2015 08/30/14   Theodis Blaze, MD  verapamil (CALAN) 120 MG tablet TAKE 1 TABLET (120 MG TOTAL) BY MOUTH AT BEDTIME. NEED OV. Patient not taking: Reported on 08/13/2015 05/22/15   Troy Sine, MD   BP 178/72 mmHg  Pulse 89  Resp 26  Ht 5\' 4"  (1.626 m)  Wt 119 lb (53.978 kg)  BMI 20.42 kg/m2  SpO2 100% Physical Exam  Constitutional: She is oriented to person, place, and time. She appears well-developed and well-nourished. No distress.  Constant moaning, appears uncomfortable  HENT:  Head: Normocephalic.  Eyes: Conjunctivae are normal. Pupils are equal, round, and reactive to light. No scleral icterus.  Neck: Normal range of motion. Neck supple. No thyromegaly present.  Cardiovascular: Normal rate and regular rhythm.  Exam reveals no gallop and no friction rub.   No murmur heard. Pulmonary/Chest: Effort normal and breath sounds normal. No respiratory distress. She has no wheezes. She has no rales.  Abdominal: Soft. Bowel sounds are normal. She exhibits no distension. There is tenderness. There is no rebound.  Epigastric to left flank tenderness No rebound   Musculoskeletal: Normal range of motion.  Neurological: She is alert and oriented to person, place, and time.  Skin: Skin is warm and dry. No rash noted.  Psychiatric: She has a normal mood and affect. Her behavior is normal.    ED Course  Procedures (including critical care time) DIAGNOSTIC STUDIES: Oxygen Saturation is 100% on RA,  normal by my interpretation.     COORDINATION OF CARE: 11:39 PM Discussed treatment plan which includes lab work, CT A/P with contrast, antiemetic medication, and pain medication with pt at bedside and pt agreed to plan.  Labs Review Labs Reviewed  COMPREHENSIVE METABOLIC PANEL - Abnormal; Notable for the following:    Chloride 98 (*)    Glucose, Bld 103 (*)    Creatinine, Ser 5.10 (*)    Calcium 10.4 (*)    Albumin 2.2 (*)    Alkaline Phosphatase 152 (*)    GFR calc non Af Amer 8 (*)    GFR calc Af Amer 10 (*)    All other components within normal limits  CBC - Abnormal; Notable for the following:    RBC 3.78 (*)    Hemoglobin 11.0 (*)    HCT 32.2 (*)    RDW 17.3 (*)    All other components within normal limits  LIPASE, BLOOD    Imaging Review Ct Abdomen Pelvis W Contrast  08/13/2015  CLINICAL DATA:  Epigastric and left flank pain. Recent left nephrectomy with postoperative retroperitoneal hematoma. EXAM: CT ABDOMEN AND PELVIS WITH CONTRAST TECHNIQUE: Multidetector CT imaging of the abdomen and pelvis was performed using the standard protocol following bolus administration of intravenous contrast. CONTRAST:  66mL ISOVUE-300 IOPAMIDOL (ISOVUE-300) INJECTION 61% COMPARISON:  Most recent CT 07/09/2015 FINDINGS: Lower chest: Persistent moderate left pleural effusion, decreased in size from prior CT. There is adjacent atelectasis in the left lower lobe. Trace subpulmonic right pleural effusion. Liver: Surgical clips post liver transplant.  No focal lesion. Hepatobiliary: Gallbladder surgically absent. No definite biliary dilatation. Pancreas: Atrophic.  No ductal dilatation or inflammation. Spleen: Sequela of splenic infarction and hematoma, with minimal splenic tissue medially is again seen. Overall decreased size  of the fluid collection in the splenic bed currently measuring 9.2 x 8.1 cm, previously 12.9 x 10.1 cm allowing for similar caliper placement. Small focus of air in the superior aspect is unchanged. Adrenal  glands: Adrenal glands are not well visualized. Kidneys: Post left nephrectomy. Previous small fluid collection in the left nephrectomy bed has completely resolved. Multi cystic appearance of the right kidney without right hydronephrosis. Stomach/Bowel: Suboptimally assessed given lack of enteric contrast. Stomach physiologically distended with ingested material. Questionable distal gastric submucosal edema is again seen. Small bowel is decompressed. Small volume of stool throughout the colon without colonic wall thickening. The appendix is not visualized. Vascular/Lymphatic: Atherosclerosis of the abdominal aorta without aneurysm. Atherosclerosis of bilateral iliac arterial system. No definite retroperitoneal adenopathy, lumbar spine surgical hardware partially obscures evaluation. Reproductive: Unchanged appearance of uterus and adnexa from prior. Bladder: Decompressed not well evaluated. Other: Small amount of dependent free fluid in the pelvis appears simple fluid density. No upper abdominal ascites. No new fluid collection. There is whole body wall edema that is increased from prior CT. No free air. Musculoskeletal: There are no acute or suspicious osseous abnormalities. Post a fusion of the lower lumbar spine. IMPRESSION: 1. Post recent splenic infarct and hematoma, decreased in size from most recent CT. 2. Post left nephrectomy. Resolved fluid collection in the nephrectomy bed. 3. Persistent moderate left pleural effusion, however interval decrease in size. 4. Suspected gastric wall thickening, similar to prior, suboptimally assessed given lack of enteric contrast. 5. Increased body wall anasarca. Electronically Signed   By: Jeb Levering M.D.   On: 08/13/2015 01:31   I personally reviewed and evaluated these images and lab results as a part of my medical decision-making.   EKG Interpretation None      MDM   Final diagnoses:  Left upper quadrant pain   Patient considerably improved after one  dose IV pain medications. Able to take some by mouth. CT scan shows improvement of her radio splenic infarct/hematoma. No free fluid. Her left surgical site from her nephrectomy appears improved as well. No acute processes. She is not febrile tachycardic. She taken by mouth and symptoms resolved. Think this all relates to her being out of her oxycodone for the last several days. Given a short supply is a prescription here and asked to follow-up with her surgeon, and primary care physician.  I personally performed the services described in this documentation, which was scribed in my presence. The recorded information has been reviewed and is accurate.    Tanna Furry, MD 08/13/15 [phone]

## 2015-08-12 NOTE — ED Notes (Signed)
The pt is c/o abd pain  For 1-2 weeks with nv   Worse today.  Dialysis pt  Fistula lt leg

## 2015-08-13 ENCOUNTER — Emergency Department (HOSPITAL_COMMUNITY): Payer: Medicare Other

## 2015-08-13 ENCOUNTER — Encounter (HOSPITAL_COMMUNITY): Payer: Self-pay | Admitting: Radiology

## 2015-08-13 DIAGNOSIS — D735 Infarction of spleen: Secondary | ICD-10-CM | POA: Diagnosis not present

## 2015-08-13 DIAGNOSIS — D631 Anemia in chronic kidney disease: Secondary | ICD-10-CM | POA: Diagnosis not present

## 2015-08-13 DIAGNOSIS — N186 End stage renal disease: Secondary | ICD-10-CM | POA: Diagnosis not present

## 2015-08-13 DIAGNOSIS — E876 Hypokalemia: Secondary | ICD-10-CM | POA: Diagnosis not present

## 2015-08-13 DIAGNOSIS — R1012 Left upper quadrant pain: Secondary | ICD-10-CM | POA: Diagnosis not present

## 2015-08-13 LAB — COMPREHENSIVE METABOLIC PANEL
ALBUMIN: 2.2 g/dL — AB (ref 3.5–5.0)
ALT: 14 U/L (ref 14–54)
AST: 28 U/L (ref 15–41)
Alkaline Phosphatase: 152 U/L — ABNORMAL HIGH (ref 38–126)
Anion gap: 12 (ref 5–15)
BILIRUBIN TOTAL: 1.1 mg/dL (ref 0.3–1.2)
BUN: 6 mg/dL (ref 6–20)
CHLORIDE: 98 mmol/L — AB (ref 101–111)
CO2: 27 mmol/L (ref 22–32)
CREATININE: 5.1 mg/dL — AB (ref 0.44–1.00)
Calcium: 10.4 mg/dL — ABNORMAL HIGH (ref 8.9–10.3)
GFR calc Af Amer: 10 mL/min — ABNORMAL LOW (ref >60)
GFR calc non Af Amer: 8 mL/min — ABNORMAL LOW (ref >60)
GLUCOSE: 103 mg/dL — AB (ref 65–99)
POTASSIUM: 3.8 mmol/L (ref 3.5–5.1)
Sodium: 137 mmol/L (ref 135–145)
TOTAL PROTEIN: 7.4 g/dL (ref 6.5–8.1)

## 2015-08-13 LAB — LIPASE, BLOOD: Lipase: 27 U/L (ref 11–51)

## 2015-08-13 MED ORDER — ONDANSETRON 4 MG PO TBDP: 4.0000 mg | ORAL_TABLET | Freq: Three times a day (TID) | ORAL | Status: DC | PRN

## 2015-08-13 MED ORDER — OXYCODONE HCL 5 MG PO TABS: 5.0000 mg | ORAL_TABLET | ORAL | Status: DC | PRN

## 2015-08-13 MED ORDER — IOPAMIDOL (ISOVUE-300) INJECTION 61%
INTRAVENOUS | Status: AC
  Administered 2015-08-13: 60 mL
  Filled 2015-08-13: qty 100

## 2015-08-13 MED ORDER — OXYCODONE HCL 5 MG PO TABS
10.0000 mg | ORAL_TABLET | Freq: Once | ORAL | Status: AC
  Administered 2015-08-13: 10 mg via ORAL
  Filled 2015-08-13: qty 2

## 2015-08-13 NOTE — ED Notes (Signed)
Unable to obtain esignature, pad in room not working

## 2015-08-13 NOTE — ED Notes (Signed)
Patient transported to CT 

## 2015-08-13 NOTE — Discharge Instructions (Signed)

## 2015-08-14 ENCOUNTER — Emergency Department
Admission: EM | Admit: 2015-08-14 | Discharge: 2015-08-15 | Disposition: A | Discharge status: Home / Self Care | Payer: Medicare Other | Attending: Emergency Medicine | Admitting: Emergency Medicine

## 2015-08-14 ENCOUNTER — Emergency Department: Payer: Medicare Other

## 2015-08-14 DIAGNOSIS — R1084 Generalized abdominal pain: Secondary | ICD-10-CM | POA: Insufficient documentation

## 2015-08-14 DIAGNOSIS — R109 Unspecified abdominal pain: Secondary | ICD-10-CM

## 2015-08-14 DIAGNOSIS — I132 Hypertensive heart and chronic kidney disease with heart failure and with stage 5 chronic kidney disease, or end stage renal disease: Secondary | ICD-10-CM | POA: Insufficient documentation

## 2015-08-14 DIAGNOSIS — Z79899 Other long term (current) drug therapy: Secondary | ICD-10-CM | POA: Insufficient documentation

## 2015-08-14 DIAGNOSIS — E1151 Type 2 diabetes mellitus with diabetic peripheral angiopathy without gangrene: Secondary | ICD-10-CM | POA: Diagnosis not present

## 2015-08-14 DIAGNOSIS — Z794 Long term (current) use of insulin: Secondary | ICD-10-CM | POA: Insufficient documentation

## 2015-08-14 DIAGNOSIS — F329 Major depressive disorder, single episode, unspecified: Secondary | ICD-10-CM | POA: Diagnosis not present

## 2015-08-14 DIAGNOSIS — N186 End stage renal disease: Secondary | ICD-10-CM | POA: Insufficient documentation

## 2015-08-14 DIAGNOSIS — Z992 Dependence on renal dialysis: Secondary | ICD-10-CM | POA: Diagnosis not present

## 2015-08-14 DIAGNOSIS — J45909 Unspecified asthma, uncomplicated: Secondary | ICD-10-CM | POA: Insufficient documentation

## 2015-08-14 DIAGNOSIS — G8929 Other chronic pain: Secondary | ICD-10-CM | POA: Diagnosis not present

## 2015-08-14 DIAGNOSIS — I5042 Chronic combined systolic (congestive) and diastolic (congestive) heart failure: Secondary | ICD-10-CM | POA: Insufficient documentation

## 2015-08-14 DIAGNOSIS — Z955 Presence of coronary angioplasty implant and graft: Secondary | ICD-10-CM | POA: Insufficient documentation

## 2015-08-14 DIAGNOSIS — E039 Hypothyroidism, unspecified: Secondary | ICD-10-CM | POA: Diagnosis not present

## 2015-08-14 DIAGNOSIS — Z8679 Personal history of other diseases of the circulatory system: Secondary | ICD-10-CM | POA: Insufficient documentation

## 2015-08-14 DIAGNOSIS — Z7901 Long term (current) use of anticoagulants: Secondary | ICD-10-CM | POA: Insufficient documentation

## 2015-08-14 DIAGNOSIS — E1122 Type 2 diabetes mellitus with diabetic chronic kidney disease: Secondary | ICD-10-CM | POA: Insufficient documentation

## 2015-08-14 DIAGNOSIS — F17210 Nicotine dependence, cigarettes, uncomplicated: Secondary | ICD-10-CM | POA: Diagnosis not present

## 2015-08-14 DIAGNOSIS — J440 Chronic obstructive pulmonary disease with acute lower respiratory infection: Secondary | ICD-10-CM | POA: Diagnosis not present

## 2015-08-14 DIAGNOSIS — E785 Hyperlipidemia, unspecified: Secondary | ICD-10-CM | POA: Insufficient documentation

## 2015-08-14 DIAGNOSIS — M1990 Unspecified osteoarthritis, unspecified site: Secondary | ICD-10-CM | POA: Diagnosis not present

## 2015-08-14 DIAGNOSIS — Z952 Presence of prosthetic heart valve: Secondary | ICD-10-CM | POA: Insufficient documentation

## 2015-08-14 DIAGNOSIS — I2510 Atherosclerotic heart disease of native coronary artery without angina pectoris: Secondary | ICD-10-CM | POA: Insufficient documentation

## 2015-08-14 DIAGNOSIS — D735 Infarction of spleen: Secondary | ICD-10-CM | POA: Diagnosis not present

## 2015-08-14 DIAGNOSIS — R112 Nausea with vomiting, unspecified: Secondary | ICD-10-CM | POA: Insufficient documentation

## 2015-08-14 LAB — COMPREHENSIVE METABOLIC PANEL
ALBUMIN: 2 g/dL — AB (ref 3.5–5.0)
ALK PHOS: 135 U/L — AB (ref 38–126)
ALT: 13 U/L — ABNORMAL LOW (ref 14–54)
ANION GAP: 11 (ref 5–15)
AST: 24 U/L (ref 15–41)
BILIRUBIN TOTAL: 1 mg/dL (ref 0.3–1.2)
BUN: 6 mg/dL (ref 6–20)
CALCIUM: 8.8 mg/dL — AB (ref 8.9–10.3)
CO2: 26 mmol/L (ref 22–32)
CREATININE: 4 mg/dL — AB (ref 0.44–1.00)
Chloride: 99 mmol/L — ABNORMAL LOW (ref 101–111)
GFR calc non Af Amer: 11 mL/min — ABNORMAL LOW (ref >60)
GFR, EST AFRICAN AMERICAN: 13 mL/min — AB (ref >60)
GLUCOSE: 107 mg/dL — AB (ref 65–99)
Potassium: 3.5 mmol/L (ref 3.5–5.1)
Sodium: 136 mmol/L (ref 135–145)
TOTAL PROTEIN: 6 g/dL — AB (ref 6.5–8.1)

## 2015-08-14 LAB — PROTIME-INR
INR: 1.4
Prothrombin Time: 17.3 seconds — ABNORMAL HIGH (ref 11.4–15.0)

## 2015-08-14 LAB — TROPONIN I: Troponin I: <0.03 ng/mL (ref <0.03)

## 2015-08-14 LAB — AMMONIA: Ammonia: 11 umol/L (ref 9–35)

## 2015-08-14 LAB — LACTIC ACID, PLASMA: Lactic Acid, Venous: 2 mmol/L (ref 0.5–1.9)

## 2015-08-14 LAB — ETHANOL: Alcohol, Ethyl (B): <5 mg/dL (ref <5)

## 2015-08-14 MED ORDER — ONDANSETRON HCL 4 MG/2ML IJ SOLN
4.0000 mg | Freq: Once | INTRAMUSCULAR | Status: AC
  Administered 2015-08-14: 4 mg via INTRAVENOUS
  Filled 2015-08-14: qty 2

## 2015-08-14 MED ORDER — HYDROMORPHONE HCL 1 MG/ML IJ SOLN
1.0000 mg | Freq: Once | INTRAMUSCULAR | Status: AC
  Administered 2015-08-14: 1 mg via INTRAVENOUS
  Filled 2015-08-14: qty 1

## 2015-08-14 MED ORDER — DIATRIZOATE MEGLUMINE & SODIUM 66-10 % PO SOLN
15.0000 mL | ORAL | Status: AC
  Administered 2015-08-14: 30 mL via ORAL

## 2015-08-14 MED ORDER — IOPAMIDOL (ISOVUE-300) INJECTION 61%
75.0000 mL | Freq: Once | INTRAVENOUS | Status: AC | PRN
  Administered 2015-08-14: 75 mL via INTRAVENOUS
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
## 2015-08-14 NOTE — ED Notes (Signed)
Patient could not finish complete contrast. MD made aware. MD states patient does not need to drink the second bottle. CT called and updated to situation.

## 2015-08-14 NOTE — ED Provider Notes (Addendum)
[facility] Emergency Department Provider Note  ____________________________________________   I have reviewed the triage vital signs and the nursing notes.   HISTORY  Chief Complaint Emesis    HPI [name] is a 60 y.o. 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 She was seen recently and had a CT scan as an outpatient that did not show any acute pathology. Patient states that she was out of her Cipro done and this was thought to be the reason that she was vomiting however she has not filled her oxycodone prescription, has ongoing and worsening abdominal pain "everywhere" and vomiting. She is having nonbloody nonbilious vomiting. Despite 2 weeks of this pain she states she has not talked her coordinator for her kidney transplant. Patient states also that she is not having any bleeding, denies worsening jaundice over her baseline, denies any melena bright red blood per rectum, denies any fever. Denies any chest pain.     Past Medical History  Diagnosis Date  . 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 Chronic back pain   . CAD (coronary artery disease)   . Obesity   . Peripheral vascular disease (HCC) hands and legs  . Anxiety   . Asthma   . GERD (gastroesophageal reflux disease)     takes Omeprazole daily  . Chronic constipation     takes MIralax and Colace daily  . Anemia     takes Folic Acid daily  . Hypothyroidism     takes Synthroid daily  . Depression     takes Cymbalta for "severe" depression  . Neuromuscular disorder ([place])     carpal tunnel in right hand  . Hypertension      takes Metoprolol and Lisinopril daily, sees Dr Bea Graff  . COPD (chronic obstructive pulmonary disease) ([place])   . Pneumonia     "today and several times before" (08/30/2012)  . Chronic bronchitis ([place])     "q yr w/season changes" (08/30/2012)  . Type II diabetes mellitus (HCC)     Levemir 2units daily if > 150  . History of blood transfusion     "several" (08/30/2012)  . Hepatitis C   . Migraine     "last migraine was in 2013" (08/30/2012)  . Headache     "at least monthly" (08/30/2012)  . Arthritis     "left hand, back" (08/30/2012)  . End stage renal disease on dialysis [facility]) 02/27/2011    Kidneys shut down at time of liver transplant in Sept 2011 at [facility] in [place], she has been on HD ever since.  Dialyzes at [facility] HD on TTS schedule.  Had L forearm graft used 10 months then removed Dec 2012 due to suspected infection.  A right upper arm AVG was placed Dec 2012 but she developed steal symptoms acutely and it was ligated the same day.  Never had an AV fistula due to small veins.  Now has L thigh AVG put in Jan 2013, has not clotted to date.    [name] CAD (coronary artery disease) Jan. 2015    Cath: 20% LAD, 50% D1; s/p LIMA-LAD  .  Aortic stenosis   . Tobacco abuse   . S/P aortic valve replacement 03/15/13    Mechanical   . SVT (supraventricular tachycardia) ([place]) 06/09/14    Patient Active Problem List   Diagnosis Date Noted  . H/O unilateral nephrectomy 07/06/2015  . Paroxysmal SVT (supraventricular tachycardia) ([place]) 06/22/2014  . Complications due to renal dialysis device, implant, and graft ([place]) 05/11/2014  . Arterial hemorrhage 03/31/2014  . Acute blood loss anemia 03/31/2014  . ESRD on dialysis ([place]) 03/31/2014  . Renal dialysis device, implant, or graft complication Q000111Q  . Bleeding 03/30/2014  . Chest pain 03/19/2014  . Acute hyperkalemia   . Dyspnea 03/07/2014  . Pain in the chest   . S/P liver transplant ([place])   . Chronic hepatitis C without hepatic coma ([place])   .  Diastolic CHF, chronic ([place])   . Thyroid activity decreased   . ESRD (end stage renal disease) ([place]) 01/03/2014  . Pulmonary edema 01/03/2014  . Hyperkalemia 01/03/2014  . Respiratory failure ([place]) 01/03/2014  . Evaluate for Sleep apnea 12/31/2013  . DM type 2 (diabetes mellitus, type 2) ([place]) 12/15/2013  . Unstable angina ([place]) 12/15/2013  . Pleural effusion 07/03/2013  . Paroxysmal supraventricular tachycardia, s/p valve replacement, with chest pain. resolved as outpatient 05/13/2013  . 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 Long term current use of anticoagulant therapy 04/22/2013  . Chronic anticoagulation w/ coumadin, goal INR 2.5-3.0 w/ AVR 04/14/2013  . S/P mechanical AVR Feb 2015 03/15/2013  . Aortic stenosis 03/09/2013  . Palpitations- run of tachycardia after HD, lasted 15-20 minutes 03/08/2013  . Tobacco abuse 02/28/2013  . COPD (chronic obstructive pulmonary disease) ([place]) 02/21/2013  . Hypoxia 02/21/2013  . Chronic combined systolic and diastolic congestive heart failure ([place]) 02/06/2013  . Acute respiratory failure ([place]) 11/02/2012  . Acute on chronic systolic CHF (congestive heart failure) ([place]) 11/02/2012  . COPD exacerbation ([place]) 08/31/2012  . Dyslipidemia-LDL 104, not on statin with Hx of liver transplant 07/30/2012  . Abn EKG- related to LVH, repol changes 07/30/2012  . CAD- s/p LIMA-LAD Feb. 2015 07/27/2012  . Aortic stenosis, moderate-severe at cath Jan 2015. s/p Mechanical AVR 03/2013 07/27/2012  . End stage renal disease on dialysis-TTS 02/27/2011  . Acute hepatitis C virus infection 06/25/2006  . Hypothyroidism 06/25/2006  . DM2 (diabetes mellitus, type 2) ([place]) 06/25/2006  . ANEMIA, CHRONIC 06/25/2006  . HTN- LVH, EF45- 50%. grade 2 diastolic dysf. echo Q000111Q 06/25/2006  . GERD 06/25/2006    Past Surgical History  Procedure Laterality Date  . Liver transplant  10/25/2009    sees Dr Ferol Luz 1 every 6 months, saw last in Dec 2013. [name]  Coord [phone]  . Small intestine surgery  90's  . Thrombectomy    . Arteriovenous graft placement Left 10/03/10     forearm  . Avgg removal  12/23/2010    Procedure: REMOVAL OF ARTERIOVENOUS GORETEX GRAFT ([place]);  Surgeon: Elam Dutch, MD;  Location: [place];  Service: Vascular;  Laterality: Left;  procedure started @1736 -1852  . Insertion of dialysis catheter  12/23/2010    Procedure: INSERTION OF DIALYSIS CATHETER;  Surgeon: Elam Dutch, MD;  Location: [place];  Service: Vascular;  Laterality: Right;  Right Internal Jugular 28cm dialysis catheter insertion procedure time 1701-1720   . Cholecystectomy  1993  . Cystoscopy  1990's  . Spinal growth rods  2010    "put 2 metal rods in my back; they had detetriorated" (08/30/2012)  .  Av fistula placement  01/29/2011    Procedure: INSERTION OF ARTERIOVENOUS (AV) GORE-TEX GRAFT ARM;  Surgeon: Elam Dutch, MD;  Location: [place];  Service: Vascular;  Laterality: Right;  . Av fistula placement  03/10/2011    Procedure: INSERTION OF ARTERIOVENOUS (AV) GORE-TEX GRAFT THIGH;  Surgeon: Elam Dutch, MD;  Location: [place];  Service: Vascular;  Laterality: Left;  . Tubal ligation  1990's  . Aortic valve replacement N/A 03/15/2013    AVR; Surgeon: Ivin Poot, MD;  Location: [facility] OR; Open Heart Surgery;  40mm[employer] mechanical prosthesis, top hat valve  . Coronary artery bypass graft N/A 03/15/2013    Procedure: CORONARY ARTERY BYPASS GRAFTING (CABG) times one using left internal mammary artery.;  Surgeon: Ivin Poot, MD;  Location: [place];  Service: Open Heart Surgery;  Laterality: N/A;  POSS CABG X 1  . Intraoperative transesophageal echocardiogram N/A 03/15/2013    Procedure: INTRAOPERATIVE TRANSESOPHAGEAL ECHOCARDIOGRAM;  Surgeon: Ivin Poot, MD;  Location: [place];  Service: Open Heart Surgery;  Laterality: N/A;  . Left heart catheterization with coronary/graft angiogram N/A 12/24/2011    Procedure: LEFT HEART CATHETERIZATION WITH  Beatrix Fetters;  Surgeon: [name], MD;  Location: [facility] CATH LAB;  Service: Cardiovascular;  Laterality: N/A;  . Left heart catheterization with coronary angiogram N/A 07/29/2012    Procedure: LEFT HEART CATHETERIZATION WITH CORONARY ANGIOGRAM;  Surgeon: Troy Sine, MD;  Location: [facility] CATH LAB;  Service: Cardiovascular;  Laterality: N/A;  . Left heart catheterization with coronary angiogram N/A 03/10/2013    Procedure: LEFT HEART CATHETERIZATION WITH CORONARY ANGIOGRAM;  Surgeon: Troy Sine, MD;  Location: [facility] CATH LAB;  Service: Cardiovascular;  Laterality: N/A;  . Left heart catheterization with coronary/graft angiogram N/A 12/16/2013    Procedure: LEFT HEART CATHETERIZATION WITH Beatrix Fetters;  Surgeon: Troy Sine, MD;  Location: [facility] CATH LAB;  Service: Cardiovascular;  Laterality: N/A;  . Thrombectomy and revision of arterioventous (av) goretex  graft Left 03/30/2014  . Thrombectomy and revision of arterioventous (av) goretex  graft Left 03/30/2014    Procedure: THROMBECTOMY AND REVISION OF ARTERIOVENTOUS (AV) GORETEX  GRAFT;  Surgeon: Conrad Cudjoe Key, MD;  Location: [place];  Service: Vascular;  Laterality: Left;  . Shuntogram Left 05/15/2014    Procedure: SHUNTOGRAM;  Surgeon: Conrad Bruin, MD;  Location[facility] CATH LAB;  Service: Cardiovascular;  Laterality: Left;    Current Outpatient Rx  Name  Route  Sig  Dispense  Refill  . insulin aspart (NOVOLOG) 100 UNIT/ML injection   Subcutaneous   Inject 1-2 Units into the skin 3 (three) times daily as needed for high blood sugar (CBG>150). CBG < 150 = no insulin CBG 150-200 = 1 unit CBG 200-250 = 2 units         . ipratropium-albuterol (DUONEB) 0.5-2.5 (3) MG/3ML SOLN   Nebulization   Take 3 mLs by nebulization every 6 (six) hours as needed. J44.1 code Patient not taking: Reported on 08/13/2015   360 mL   1   . levothyroxine (SYNTHROID, LEVOTHROID) 150 MCG tablet   Oral   Take 150 mcg by mouth daily before  breakfast.         . LORazepam (ATIVAN) 0.5 MG tablet   Oral   Take 0.5 mg by mouth every 6 (six) hours as needed (for nausea and vomiting).       0   . metoCLOPramide (REGLAN) 10 MG tablet   Oral   Take 10 mg  by mouth 3 (three) times daily before meals.         . metoprolol tartrate (LOPRESSOR) 25 MG tablet   Oral   Take 25 mg by mouth 2 (two) times daily.          . nitroGLYCERIN (NITROSTAT) 0.4 MG SL tablet   Sublingual   Place 1 tablet (0.4 mg total) under the tongue every 5 (five) minutes as needed for chest pain.   30 tablet   0   . ondansetron (ZOFRAN ODT) 4 MG disintegrating tablet   Oral   Take 1 tablet (4 mg total) by mouth every 8 (eight) hours as needed for nausea.   6 tablet   0   . oxyCODONE (ROXICODONE) 5 MG immediate release tablet   Oral   Take 1 tablet (5 mg total) by mouth every 4 (four) hours as needed for severe pain.   15 tablet   0   . promethazine (PHENERGAN) 12.5 MG tablet   Oral   Take 12.5 mg by mouth every 6 (six) hours as needed for nausea or vomiting.         . senna-docusate (SENOKOT-S) 8.6-50 MG tablet   Oral   Take 1 tablet by mouth daily.         . tacrolimus (PROGRAF) 1 MG capsule   Oral   Take 3 mg by mouth 2 (two) times daily.          . traMADol (ULTRAM) 50 MG tablet   Oral   Take 1 tablet (50 mg total) by mouth every 12 (twelve) hours as needed for moderate pain. Patient not taking: Reported on 08/13/2015   60 tablet   0   . verapamil (CALAN) 120 MG tablet      TAKE 1 TABLET (120 MG TOTAL) BY MOUTH AT BEDTIME. NEED OV. Patient not taking: Reported on 08/13/2015   15 tablet   0   . warfarin (COUMADIN) 4 MG tablet   Oral   Take 2 mg by mouth daily.            Allergies Acetaminophen; Codeine; Mirtazapine; and Morphine  Family History  Problem Relation Age of Onset  . Cancer Mother   . Diabetes Mother   . Hypertension Mother   . Stroke Mother   . Cancer Father   . Anesthesia problems Neg Hx   .  Hypotension Neg Hx   . Malignant hyperthermia Neg Hx   . Pseudochol deficiency Neg Hx     Social History Social History  Substance Use Topics  . Smoking status: Current Every Day Smoker -- 0.25 packs/day for 40 years    Types: Cigarettes  . Smokeless tobacco: Never Used     Comment: still smokng 1 or 2 a day  . Alcohol Use: No    Review of Systems Constitutional: No fever/chills Eyes: No visual changes. ENT: No sore throat. No stiff neck no neck pain Cardiovascular: Denies chest pain. Respiratory: Denies shortness of breath. Gastrointestinal:   Positive vomiting.  No diarrhea.  No constipation. Genitourinary: Negative for dysuria. Musculoskeletal: Negative lower extremity swelling Skin: Negative for rash. Neurological: Negative for headaches, focal weakness or numbness. 10-point ROS otherwise negative.  ____________________________________________   PHYSICAL EXAM:  VITAL SIGNS: ED Triage Vitals  Enc Vitals Group     BP 08/14/15 2059 187/68 mmHg     Pulse Rate 08/14/15 2059 87     Resp 08/14/15 2059 18     Temp 08/14/15 2059 98.2 F (36.8  C)     Temp Source 08/14/15 2059 Oral     SpO2 08/14/15 2059 100 %     Weight 08/14/15 2058 125 lb (56.7 kg)     Height 08/14/15 2058 5\' 4"  (1.626 m)     Head Cir --      Peak Flow --      Pain Score 08/14/15 2059 10     Pain Loc --      Pain Edu? --      Excl. in [place]? --     Constitutional: Alert and oriented. Medically nontoxic but patient upset vomiting occasionally. Complaining of pain..  Eyes: Conjunctivae are lightly icteric. PERRL. EOMI. Head: Atraumatic. Nose: No congestion/rhinnorhea. Mouth/Throat: Mucous membranes are moist.  Oropharynx non-erythematous. Neck: No stridor.   Nontender with no meningismus Cardiovascular: Normal rate, regular rhythm. Grossly normal heart sounds.  Good peripheral circulation. Respiratory: Normal respiratory effort.  No retractions. Lungs CTAB. Abdominal: Mild diffuse tenderness noted  no guarding no rebound Back:  There is no focal tenderness or step off there is no midline tenderness there are no lesions noted. there is no CVA tenderness Musculoskeletal: No lower extremity tenderness. No joint effusions, no DVT signs strong distal pulses no edema Neurologic:  Normal speech and language. No gross focal neurologic deficits are appreciated.  Skin:  Skin is warm, dry and intact. No rash noted. Psychiatric: Mood and affect are normal. Speech and behavior are normal.  ____________________________________________   LABS (all labs ordered are listed, but only abnormal results are displayed)  Labs Reviewed  AMMONIA  ETHANOL  COMPREHENSIVE METABOLIC PANEL  CBC WITH DIFFERENTIAL/PLATELET  TROPONIN I  PROTIME-INR   ____________________________________________  EKG  I personally interpreted any EKGs ordered by me or triage Late entry, likely old anterior infarct, sinus rhythm, no acute ST elevation or in depression, long QTc noted.[facility]EDDATETIMESTAMP  ____________________________________________  [id number]  I reviewed any imaging ordered by me or triage that were performed during my shift and, if possible, patient and/or family made aware of any abnormal findings. ____________________________________________   PROCEDURES  Procedure(s) performed: None  Critical Care performed: None L___________________________________________   INITIAL IMPRESSION / ASSESSMENT AND PLAN / ED COURSE  Pertinent labs & imaging results that were available during my care of the patient were reviewed by me and considered in my medical decision making (see chart for details).  Patient with chronic recurrent abdominal pain nausea and vomiting presents with abdominal pain nausea and vomiting. She appears quite distraught although nontoxic otherwise. Vital signs are reassuring she is mildly hypertensive but has been like that on multiple prior visits. Patient obviously will require imaging  to ensure no worsening symptoms given her in fairly complicated past medical history although at this time she does not a surgical abdomen. We will give her pain control, nausea medication and we'll check blood work and reassess.  ----------------------------------------- 10:31 PM on 08/14/2015 -----------------------------------------  Patient with very poor IV access. Ultimately we're able to get one in her hand. We were then able to give her pain medication this time she is much calmer, no evidence of discomfort whatsoever. I'm encouraged to find that the patient had a hotdog and baked beans for lunch despite the pain that she has been having. Certainly there could be some component of narcotic dependency. However, given the patient's recent splenic infarct etc., as well as her descriptions of severe ongoing abdominal pain and do think that imaging is necessary although I do not see patient did have recent imaging for  this  Patient does have epigastric discomfort at this time only to palpation she is not actually vomiting here  ----------------------------------------- 11:36 PM on 08/14/2015 -----------------------------------------  Pt in nad. She indicated that she was not going to go to CT scan without more narcotic pain medication however she is very comfortable in appearance at this time and we will defer that particular intervention until after we have a better sense of what we are dealing with. Signed out to Dr. Owens Shark at the end of my shift.   FINAL CLINICAL IMPRESSION(S) / ED DIAGNOSES  Final diagnoses:  None      This chart was dictated using voice recognition software.  Despite best efforts to proofread,  errors can occur which can change meaning.     Schuyler Amor, MD 08/14/15 2145  Schuyler Amor, MD 08/14/15 [place], MD 08/14/15 [place], MD 09/03/15 [phone]

## 2015-08-15 DIAGNOSIS — R1084 Generalized abdominal pain: Secondary | ICD-10-CM | POA: Diagnosis not present

## 2015-08-15 DIAGNOSIS — I359 Nonrheumatic aortic valve disorder, unspecified: Secondary | ICD-10-CM | POA: Diagnosis not present

## 2015-08-15 LAB — CBC WITH DIFFERENTIAL/PLATELET
Band Neutrophils: 1 %
Basophils Absolute: 0 10*3/uL (ref 0–0.1)
Basophils Relative: 0 %
Blasts: 0 %
EOS PCT: 1 %
Eosinophils Absolute: 0.1 10*3/uL (ref 0–0.7)
HEMATOCRIT: 28.2 % — AB (ref 35.0–47.0)
HEMOGLOBIN: 9.8 g/dL — AB (ref 12.0–16.0)
LYMPHS ABS: 0.6 10*3/uL — AB (ref 1.0–3.6)
Lymphocytes Relative: 9 %
MCH: 30.5 pg (ref 26.0–34.0)
MCHC: 34.7 g/dL (ref 32.0–36.0)
MCV: 87.8 fL (ref 80.0–100.0)
MONOS PCT: 4 %
Metamyelocytes Relative: 0 %
Monocytes Absolute: 0.3 10*3/uL (ref 0.2–0.9)
Myelocytes: 0 %
NEUTROS ABS: 5.6 10*3/uL (ref 1.4–6.5)
NEUTROS PCT: 85 %
NRBC: 0 /100{WBCs}
Other: 0 %
Platelets: 160 10*3/uL (ref 150–440)
Promyelocytes Absolute: 0 %
RBC: 3.21 MIL/uL — AB (ref 3.80–5.20)
RDW: 17.5 % — ABNORMAL HIGH (ref 11.5–14.5)
WBC: 6.6 10*3/uL (ref 3.6–11.0)

## 2015-08-15 LAB — PROTIME-INR: INR: 1.5 — AB (ref 0.9–1.1)

## 2015-08-15 MED ORDER — ONDANSETRON 4 MG PO TBDP: 4.0000 mg | ORAL_TABLET | Freq: Three times a day (TID) | ORAL | Status: DC | PRN

## 2015-08-15 MED ORDER — HYDROMORPHONE HCL 1 MG/ML IJ SOLN
1.0000 mg | Freq: Once | INTRAMUSCULAR | Status: AC
  Administered 2015-08-15: 1 mg via INTRAVENOUS
  Filled 2015-08-15: qty 1

## 2015-08-15 NOTE — Discharge Instructions (Signed)

## 2015-08-17 ENCOUNTER — Ambulatory Visit (INDEPENDENT_AMBULATORY_CARE_PROVIDER_SITE_OTHER): Payer: Medicare Other | Admitting: Pharmacist Clinician (PhC)/ Clinical Pharmacy Specialist

## 2015-08-17 DIAGNOSIS — Z7901 Long term (current) use of anticoagulants: Secondary | ICD-10-CM

## 2015-08-17 DIAGNOSIS — Z952 Presence of prosthetic heart valve: Secondary | ICD-10-CM

## 2015-08-17 DIAGNOSIS — Z954 Presence of other heart-valve replacement: Secondary | ICD-10-CM

## 2015-08-21 ENCOUNTER — Telehealth: Payer: Self-pay

## 2015-08-21 NOTE — Telephone Encounter (Signed)
Daughter called to report symptoms of pt's feet.  Reported the pt. has had intermittent discoloration and temperature change of toes, of both feet.  Stated the pt. spent approx. 2.5 mos. @ [facility].  Reported she was discharged about 1 mo. ago.  Reported the toes of both feet are turning "black", and get "cold."  Stated the toes have been intermittently changing color and temperature, and now seem to stay dark.  Reported that Dr. Justin Mend saw the pt. last week at the [facility], and recommended she f/u with Vascular Surgery, due to the discoloration, and faint pulses in feet.  Reported the pt. states the toes are painful most of the time.  Reported the pt. C/o legs hurting both with walking, and at rest.  Denied any open sores of lower extremities.  Call placed to [facility] in [place]; spoke with Otila Kluver, Therapist, sports.  Advised that will need a referral for this pt. to be evaluated, since pt. Has not been treated for PVD at our office; only had thigh graft insertion. Per Otila Kluver, she will check on this, and send a referral.

## 2015-08-22 DIAGNOSIS — I359 Nonrheumatic aortic valve disorder, unspecified: Secondary | ICD-10-CM | POA: Diagnosis not present

## 2015-08-22 LAB — PROTIME-INR: INR: 1.9 — AB (ref 0.9–1.1)

## 2015-08-23 DIAGNOSIS — K311 Adult hypertrophic pyloric stenosis: Secondary | ICD-10-CM | POA: Diagnosis not present

## 2015-08-23 DIAGNOSIS — D735 Infarction of spleen: Secondary | ICD-10-CM | POA: Diagnosis not present

## 2015-08-23 DIAGNOSIS — E43 Unspecified severe protein-calorie malnutrition: Secondary | ICD-10-CM | POA: Diagnosis not present

## 2015-08-23 DIAGNOSIS — N186 End stage renal disease: Secondary | ICD-10-CM | POA: Diagnosis not present

## 2015-08-23 DIAGNOSIS — J90 Pleural effusion, not elsewhere classified: Secondary | ICD-10-CM | POA: Diagnosis not present

## 2015-08-23 DIAGNOSIS — R34 Anuria and oliguria: Secondary | ICD-10-CM | POA: Diagnosis not present

## 2015-08-23 DIAGNOSIS — R1110 Vomiting, unspecified: Secondary | ICD-10-CM | POA: Diagnosis not present

## 2015-08-23 DIAGNOSIS — Z944 Liver transplant status: Secondary | ICD-10-CM | POA: Diagnosis not present

## 2015-08-23 DIAGNOSIS — Z6821 Body mass index (BMI) 21.0-21.9, adult: Secondary | ICD-10-CM | POA: Diagnosis not present

## 2015-08-23 DIAGNOSIS — R109 Unspecified abdominal pain: Secondary | ICD-10-CM | POA: Diagnosis not present

## 2015-08-23 NOTE — Telephone Encounter (Signed)
Appt. Scheduled for ABI's and office exam on 09/25/15 with Dr. Donnetta Hutching.  Referral Coordinator has contacted nurse at the [facility], to give appt. To the pt.

## 2015-08-24 ENCOUNTER — Telehealth: Payer: Self-pay | Admitting: Cardiovascular Disease

## 2015-08-24 DIAGNOSIS — R1110 Vomiting, unspecified: Secondary | ICD-10-CM | POA: Diagnosis not present

## 2015-08-24 DIAGNOSIS — R933 Abnormal findings on diagnostic imaging of other parts of digestive tract: Secondary | ICD-10-CM | POA: Diagnosis not present

## 2015-08-24 DIAGNOSIS — K311 Adult hypertrophic pyloric stenosis: Secondary | ICD-10-CM | POA: Diagnosis present

## 2015-08-24 DIAGNOSIS — Z8659 Personal history of other mental and behavioral disorders: Secondary | ICD-10-CM | POA: Diagnosis not present

## 2015-08-24 DIAGNOSIS — N186 End stage renal disease: Secondary | ICD-10-CM | POA: Diagnosis not present

## 2015-08-24 DIAGNOSIS — Z794 Long term (current) use of insulin: Secondary | ICD-10-CM | POA: Diagnosis not present

## 2015-08-24 DIAGNOSIS — I120 Hypertensive chronic kidney disease with stage 5 chronic kidney disease or end stage renal disease: Secondary | ICD-10-CM | POA: Diagnosis present

## 2015-08-24 DIAGNOSIS — N281 Cyst of kidney, acquired: Secondary | ICD-10-CM | POA: Diagnosis not present

## 2015-08-24 DIAGNOSIS — R112 Nausea with vomiting, unspecified: Secondary | ICD-10-CM | POA: Diagnosis not present

## 2015-08-24 DIAGNOSIS — R451 Restlessness and agitation: Secondary | ICD-10-CM | POA: Diagnosis not present

## 2015-08-24 DIAGNOSIS — F17210 Nicotine dependence, cigarettes, uncomplicated: Secondary | ICD-10-CM | POA: Diagnosis present

## 2015-08-24 DIAGNOSIS — I350 Nonrheumatic aortic (valve) stenosis: Secondary | ICD-10-CM | POA: Diagnosis not present

## 2015-08-24 DIAGNOSIS — Z992 Dependence on renal dialysis: Secondary | ICD-10-CM | POA: Diagnosis not present

## 2015-08-24 DIAGNOSIS — I2510 Atherosclerotic heart disease of native coronary artery without angina pectoris: Secondary | ICD-10-CM | POA: Diagnosis present

## 2015-08-24 DIAGNOSIS — R109 Unspecified abdominal pain: Secondary | ICD-10-CM | POA: Diagnosis not present

## 2015-08-24 DIAGNOSIS — E1122 Type 2 diabetes mellitus with diabetic chronic kidney disease: Secondary | ICD-10-CM | POA: Diagnosis present

## 2015-08-24 DIAGNOSIS — Z6821 Body mass index (BMI) 21.0-21.9, adult: Secondary | ICD-10-CM | POA: Diagnosis not present

## 2015-08-24 DIAGNOSIS — R410 Disorientation, unspecified: Secondary | ICD-10-CM | POA: Diagnosis not present

## 2015-08-24 DIAGNOSIS — G8929 Other chronic pain: Secondary | ICD-10-CM | POA: Diagnosis present

## 2015-08-24 DIAGNOSIS — E039 Hypothyroidism, unspecified: Secondary | ICD-10-CM | POA: Diagnosis present

## 2015-08-24 DIAGNOSIS — K7460 Unspecified cirrhosis of liver: Secondary | ICD-10-CM | POA: Diagnosis present

## 2015-08-24 DIAGNOSIS — F329 Major depressive disorder, single episode, unspecified: Secondary | ICD-10-CM | POA: Diagnosis present

## 2015-08-24 DIAGNOSIS — N86 Erosion and ectropion of cervix uteri: Secondary | ICD-10-CM | POA: Diagnosis not present

## 2015-08-24 DIAGNOSIS — D631 Anemia in chronic kidney disease: Secondary | ICD-10-CM | POA: Diagnosis not present

## 2015-08-24 DIAGNOSIS — E119 Type 2 diabetes mellitus without complications: Secondary | ICD-10-CM | POA: Diagnosis not present

## 2015-08-24 DIAGNOSIS — D735 Infarction of spleen: Secondary | ICD-10-CM | POA: Diagnosis not present

## 2015-08-24 DIAGNOSIS — K5900 Constipation, unspecified: Secondary | ICD-10-CM | POA: Diagnosis not present

## 2015-08-24 DIAGNOSIS — F419 Anxiety disorder, unspecified: Secondary | ICD-10-CM | POA: Diagnosis present

## 2015-08-24 DIAGNOSIS — Z952 Presence of prosthetic heart valve: Secondary | ICD-10-CM | POA: Diagnosis not present

## 2015-08-24 DIAGNOSIS — Z452 Encounter for adjustment and management of vascular access device: Secondary | ICD-10-CM | POA: Diagnosis not present

## 2015-08-24 DIAGNOSIS — D649 Anemia, unspecified: Secondary | ICD-10-CM | POA: Diagnosis present

## 2015-08-24 DIAGNOSIS — S36029A Unspecified contusion of spleen, initial encounter: Secondary | ICD-10-CM | POA: Diagnosis not present

## 2015-08-24 DIAGNOSIS — F05 Delirium due to known physiological condition: Secondary | ICD-10-CM | POA: Diagnosis not present

## 2015-08-24 DIAGNOSIS — E43 Unspecified severe protein-calorie malnutrition: Secondary | ICD-10-CM | POA: Diagnosis present

## 2015-08-24 DIAGNOSIS — Z944 Liver transplant status: Secondary | ICD-10-CM | POA: Diagnosis not present

## 2015-08-24 DIAGNOSIS — R188 Other ascites: Secondary | ICD-10-CM | POA: Diagnosis not present

## 2015-08-24 DIAGNOSIS — Z66 Do not resuscitate: Secondary | ICD-10-CM | POA: Diagnosis present

## 2015-08-24 DIAGNOSIS — R918 Other nonspecific abnormal finding of lung field: Secondary | ICD-10-CM | POA: Diagnosis not present

## 2015-08-24 DIAGNOSIS — Z951 Presence of aortocoronary bypass graft: Secondary | ICD-10-CM | POA: Diagnosis not present

## 2015-08-24 DIAGNOSIS — Z01810 Encounter for preprocedural cardiovascular examination: Secondary | ICD-10-CM | POA: Diagnosis not present

## 2015-08-24 DIAGNOSIS — R627 Adult failure to thrive: Secondary | ICD-10-CM | POA: Diagnosis present

## 2015-08-24 DIAGNOSIS — I959 Hypotension, unspecified: Secondary | ICD-10-CM | POA: Diagnosis not present

## 2015-08-24 DIAGNOSIS — J90 Pleural effusion, not elsewhere classified: Secondary | ICD-10-CM | POA: Diagnosis not present

## 2015-08-24 DIAGNOSIS — B1920 Unspecified viral hepatitis C without hepatic coma: Secondary | ICD-10-CM | POA: Diagnosis present

## 2015-08-24 DIAGNOSIS — L89152 Pressure ulcer of sacral region, stage 2: Secondary | ICD-10-CM | POA: Diagnosis not present

## 2015-08-24 NOTE — Telephone Encounter (Signed)
New message      The daughter just wants for Minette [place] know the pt is in the hospital [facility]

## 2015-09-04 ENCOUNTER — Other Ambulatory Visit: Payer: Self-pay | Admitting: Vascular Surgery

## 2015-09-04 DIAGNOSIS — R230 Cyanosis: Secondary | ICD-10-CM

## 2015-09-04 DIAGNOSIS — I7389 Other specified peripheral vascular diseases: Secondary | ICD-10-CM

## 2015-09-07 ENCOUNTER — Ambulatory Visit (INDEPENDENT_AMBULATORY_CARE_PROVIDER_SITE_OTHER): Payer: Medicare Other | Admitting: Pharmacist

## 2015-09-07 ENCOUNTER — Encounter: Payer: Self-pay | Admitting: Nurse Practitioner

## 2015-09-07 DIAGNOSIS — E1122 Type 2 diabetes mellitus with diabetic chronic kidney disease: Secondary | ICD-10-CM | POA: Diagnosis not present

## 2015-09-07 DIAGNOSIS — Z951 Presence of aortocoronary bypass graft: Secondary | ICD-10-CM | POA: Diagnosis not present

## 2015-09-07 DIAGNOSIS — I359 Nonrheumatic aortic valve disorder, unspecified: Secondary | ICD-10-CM | POA: Diagnosis not present

## 2015-09-07 DIAGNOSIS — Z952 Presence of prosthetic heart valve: Secondary | ICD-10-CM

## 2015-09-07 DIAGNOSIS — E118 Type 2 diabetes mellitus with unspecified complications: Secondary | ICD-10-CM | POA: Diagnosis not present

## 2015-09-07 DIAGNOSIS — Z954 Presence of other heart-valve replacement: Secondary | ICD-10-CM

## 2015-09-07 DIAGNOSIS — Z992 Dependence on renal dialysis: Secondary | ICD-10-CM | POA: Diagnosis not present

## 2015-09-07 DIAGNOSIS — R627 Adult failure to thrive: Secondary | ICD-10-CM | POA: Diagnosis not present

## 2015-09-07 DIAGNOSIS — Z7982 Long term (current) use of aspirin: Secondary | ICD-10-CM | POA: Diagnosis not present

## 2015-09-07 DIAGNOSIS — S36029D Unspecified contusion of spleen, subsequent encounter: Secondary | ICD-10-CM | POA: Diagnosis not present

## 2015-09-07 DIAGNOSIS — F17210 Nicotine dependence, cigarettes, uncomplicated: Secondary | ICD-10-CM | POA: Diagnosis not present

## 2015-09-07 DIAGNOSIS — I2510 Atherosclerotic heart disease of native coronary artery without angina pectoris: Secondary | ICD-10-CM | POA: Diagnosis not present

## 2015-09-07 DIAGNOSIS — E039 Hypothyroidism, unspecified: Secondary | ICD-10-CM | POA: Diagnosis not present

## 2015-09-07 DIAGNOSIS — L89152 Pressure ulcer of sacral region, stage 2: Secondary | ICD-10-CM | POA: Diagnosis not present

## 2015-09-07 DIAGNOSIS — Z9181 History of falling: Secondary | ICD-10-CM | POA: Diagnosis not present

## 2015-09-07 DIAGNOSIS — Z7901 Long term (current) use of anticoagulants: Secondary | ICD-10-CM

## 2015-09-07 DIAGNOSIS — N186 End stage renal disease: Secondary | ICD-10-CM | POA: Diagnosis not present

## 2015-09-07 DIAGNOSIS — M6281 Muscle weakness (generalized): Secondary | ICD-10-CM | POA: Diagnosis not present

## 2015-09-07 DIAGNOSIS — I120 Hypertensive chronic kidney disease with stage 5 chronic kidney disease or end stage renal disease: Secondary | ICD-10-CM | POA: Diagnosis not present

## 2015-09-07 DIAGNOSIS — Z794 Long term (current) use of insulin: Secondary | ICD-10-CM | POA: Diagnosis not present

## 2015-09-07 DIAGNOSIS — J449 Chronic obstructive pulmonary disease, unspecified: Secondary | ICD-10-CM | POA: Diagnosis not present

## 2015-09-07 DIAGNOSIS — Z944 Liver transplant status: Secondary | ICD-10-CM | POA: Diagnosis not present

## 2015-09-07 LAB — POCT INR: INR: 1.8

## 2015-09-10 ENCOUNTER — Ambulatory Visit: Payer: Medicare Other | Admitting: Nurse Practitioner

## 2015-09-10 DIAGNOSIS — E1122 Type 2 diabetes mellitus with diabetic chronic kidney disease: Secondary | ICD-10-CM | POA: Diagnosis not present

## 2015-09-10 DIAGNOSIS — M6281 Muscle weakness (generalized): Secondary | ICD-10-CM | POA: Diagnosis not present

## 2015-09-10 DIAGNOSIS — L89152 Pressure ulcer of sacral region, stage 2: Secondary | ICD-10-CM | POA: Diagnosis not present

## 2015-09-10 DIAGNOSIS — S36029D Unspecified contusion of spleen, subsequent encounter: Secondary | ICD-10-CM | POA: Diagnosis not present

## 2015-09-10 DIAGNOSIS — T8640 Unspecified complication of liver transplant: Secondary | ICD-10-CM | POA: Diagnosis not present

## 2015-09-10 DIAGNOSIS — Z992 Dependence on renal dialysis: Secondary | ICD-10-CM | POA: Diagnosis not present

## 2015-09-10 DIAGNOSIS — N186 End stage renal disease: Secondary | ICD-10-CM | POA: Diagnosis not present

## 2015-09-10 DIAGNOSIS — I120 Hypertensive chronic kidney disease with stage 5 chronic kidney disease or end stage renal disease: Secondary | ICD-10-CM | POA: Diagnosis not present

## 2015-09-10 DIAGNOSIS — R0989 Other specified symptoms and signs involving the circulatory and respiratory systems: Secondary | ICD-10-CM

## 2015-09-10 NOTE — Progress Notes (Deleted)
CARDIOLOGY OFFICE NOTE  Date:  09/10/2015    [name] Date of Birth: 11/03/1955 Medical Record [id number]  PCP:  [name], MD  Cardiologist:  [name]  No chief complaint on file.   History of Present Illness: [name] is a 60 y.o. female who presents today for a work in visit. Seen for Dr. [name].   She has CAD with prior CABG, prior AVR, OSA and end-stage renal disease and is on chronic hemodialysis.  After undergoing liver transplantation at [facility] for hepatitis C,  she was admitted to [facility] in January 2015 with increasing chest pain.  A dobutamine echo study suggested severe AS with an ejection fraction of 45%.  Cardiac catheterization revealed mild CAD with 50% narrowing in moderately severe aortic stenosis with aortic valve area in the range of 0.8-1.2.  She ultimately underwent aortic valve replacement with mechanical aortic valve and single vessel LIMA to LAD CABG revascularization surgery on 03/15/2013 by Dr. [name].  Following discharge, she presented to the hospital for further evaluation of chest pain and palpitations.  Troponins were negative.  Beta blocker therapy was increased to following discharge.     She was  hospitalized from November 5 through 12/21/2013 and presented with chest pain episodes which occurred during dialysis.  Her ECG showed new T-wave inversion in inferior leads.  She underwent a repeat cardiac catheterization on November 6 by me which showed a focal 70% mid LAD stenosis after the takeoff of the second diagonal vessel, but the LIMA graft to the mid LAD was patent.  The left circumflex vessel had 20% smooth narrowing in the second marginal branch.  The large dominant RCA was normal.  Medical therapy was recommended.  She was re-coumadinized prior to discharge.  She was found to have AV graft bleeding/degeneration and underwent hemodialysis graft revision of her left thigh AV graft.    She was hospitalized from  05/11/2014 through 05/20/2014 on the vascular service with complications due to renal dialysis device implant and graft.  She also was seen in the emergency room with paroxysmal SVT which was treated with adenosine in 06/09/2014 and was seen on 06/17/2014 with mild chest pain and palpitations.    She was hospitalized at [facility] on 2 occasions back in April and again in May of 2017.   She was found to have a significant  left subcapsular hematoma of her kidney.  She ultimately underwent left kidney nephrectomy.. An echo Doppler study on 05/18/2015 showed normal to low normal LV function.  There was a least moderate perivalvular leak consistent with aortic regurgitation.  A subsequent echo on 06/19/2015 showed improvement with an EF of 50 to 55% and only trace aortic regurgitation.  She has a mechanical aortic valve.  The pressure gradient was 33 mm peek with a mean of 15 mm.  She was last seen by Dr. [name] back in May.   Comes in today. Here with   Past Medical History:  Diagnosis Date  . Anemia    takes Folic Acid daily  . Anxiety   . Aortic stenosis   . Arthritis    "left hand, back" (08/30/2012)  . Asthma   . CAD (coronary artery disease)   . CAD (coronary artery disease) Jan. 2015   Cath: 20% LAD, 50% D1; s/p LIMA-LAD  . Chronic back pain   . Chronic bronchitis ([place])    "q yr w/season changes" (08/30/2012)  . Chronic constipation  takes MIralax and Colace daily  . COPD (chronic obstructive pulmonary disease) ([place])   . Depression    takes Cymbalta for "severe" depression  . End stage renal disease on dialysis [facility]) 02/27/2011   Kidneys shut down at time of liver transplant in Sept 2011 at [facility] in [place], she has been on HD ever since.  Dialyzes at [facility] HD on TTS schedule.  Had L forearm graft used 10 months then removed Dec 2012 due to suspected infection.  A right upper arm AVG was placed Dec 2012 but she developed steal symptoms acutely and it was ligated the same day.   Never had an AV fistula due to small veins.  Now has L thigh AVG put in Jan 2013, has not clotted to date.    [name] GERD (gastroesophageal reflux disease)    takes Omeprazole daily  . Headache    "at least monthly" (08/30/2012)  . Hepatitis C   . History of blood transfusion    "several" (08/30/2012)  . Hypertension    takes Metoprolol and Lisinopril daily, sees Dr Bea Graff  . Hypothyroidism    takes Synthroid daily  . Migraine    "last migraine was in 2013" (08/30/2012)  . Neuromuscular disorder ([place])    carpal tunnel in right hand  . Obesity   . Peripheral vascular disease (HCC) hands and legs  . Pneumonia    "today and several times before" (08/30/2012)  . 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 SVT (supraventricular tachycardia) ([place]) 06/09/14  . Tobacco abuse   . Type II diabetes mellitus (HCC)    Levemir 2units daily if > 150    Past Surgical History:  Procedure Laterality Date  . AORTIC VALVE REPLACEMENT N/A 03/15/2013   AVR; Surgeon: Ivin Poot, MD;  Location: [facility] OR; Open Heart Surgery;  13mm[employer] mechanical prosthesis, top hat valve  . ARTERIOVENOUS GRAFT PLACEMENT Left 10/03/10    forearm  . AV FISTULA PLACEMENT  01/29/2011   Procedure: INSERTION OF ARTERIOVENOUS (AV) GORE-TEX GRAFT ARM;  Surgeon: Elam Dutch, MD;  Location: [facility] OR;  Service: Vascular;  Laterality: Right;  . AV FISTULA PLACEMENT  03/10/2011   Procedure: INSERTION OF ARTERIOVENOUS (AV) GORE-TEX GRAFT THIGH;  Surgeon: Elam Dutch, MD;  Location: [place];  Service: Vascular;  Laterality: Left;  . [place] REMOVAL  12/23/2010   Procedure: REMOVAL OF ARTERIOVENOUS GORETEX GRAFT ([place]);  Surgeon: Elam Dutch, MD;  Location: [place];  Service: Vascular;  Laterality: Left;  procedure started @1736 -1852  . CHOLECYSTECTOMY  1993  . CORONARY ARTERY BYPASS GRAFT N/A  03/15/2013   Procedure: CORONARY ARTERY BYPASS GRAFTING (CABG) times one using left internal mammary artery.;  Surgeon: Ivin Poot, MD;  Location: [place];  Service: Open Heart Surgery;  Laterality: N/A;  POSS CABG X 1  . CYSTOSCOPY  1990's  . INSERTION OF DIALYSIS CATHETER  12/23/2010   Procedure: INSERTION OF DIALYSIS CATHETER;  Surgeon: Elam Dutch, MD;  Location: [place];  Service: Vascular;  Laterality: Right;  Right Internal Jugular 28cm dialysis catheter insertion procedure time 1701-1720   . INTRAOPERATIVE TRANSESOPHAGEAL ECHOCARDIOGRAM N/A 03/15/2013   Procedure: INTRAOPERATIVE TRANSESOPHAGEAL ECHOCARDIOGRAM;  Surgeon: Ivin Poot, MD;  Location: [place];  Service: Open Heart Surgery;  Laterality: N/A;  . LEFT HEART CATHETERIZATION WITH  CORONARY ANGIOGRAM N/A 07/29/2012   Procedure: LEFT HEART CATHETERIZATION WITH CORONARY ANGIOGRAM;  Surgeon: Troy Sine, MD;  [facility] C[facility] LAB;  Service: Cardiovascular;  Laterality: N/A;  . LEFT HEART CATHETERIZATION WITH CORONARY ANGIOGRAM N/A 03/10/2013   Procedure: LEFT HEART CATHETERIZATION WITH CORONARY ANGIOGRAM;  Surgeon: Troy Sine, MD;  Location: [facility] CATH LAB;  Service: Cardiovascular;  Laterality: N/A;  . LEFT HEART CATHETERIZATION WITH CORONARY/GRAFT ANGIOGRAM N/A 12/24/2011   Procedure: LEFT HEART CATHETERIZATION WITH Beatrix Fetters;  Surgeon: [name], MD;  Location: [facility] CATH LAB;  Service: Cardiovascular;  Laterality: N/A;  . LEFT HEART CATHETERIZATION WITH CORONARY/GRAFT ANGIOGRAM N/A 12/16/2013   Procedure: LEFT HEART CATHETERIZATION WITH Beatrix Fetters;  Surgeon: Troy Sine, MD;  Location: [facility] CATH LAB;  Service: Cardiovascular;  Laterality: N/A;  . LIVER TRANSPLANT  10/25/2009   sees Dr Ferol Luz 1 every 6 months, saw last in Dec 2013. [name] Coord [phone]  . SHUNTOGRAM Left 05/15/2014   Procedure: SHUNTOGRAM;  Surgeon: Conrad McIntosh, MD;  Location: [facility] CATH LAB;  Service: Cardiovascular;   Laterality: Left;  . SMALL INTESTINE SURGERY  90's  . SPINAL GROWTH RODS  2010   "put 2 metal rods in my back; they had detetriorated" (08/30/2012)  . THROMBECTOMY    . THROMBECTOMY AND REVISION OF ARTERIOVENTOUS (AV) GORETEX  GRAFT Left 03/30/2014  . THROMBECTOMY AND REVISION OF ARTERIOVENTOUS (AV) GORETEX  GRAFT Left 03/30/2014   Procedure: THROMBECTOMY AND REVISION OF ARTERIOVENTOUS (AV) GORETEX  GRAFT;  Surgeon: Conrad [place], MD;  Location: [place];  Service: Vascular;  Laterality: Left;  . TUBAL LIGATION  1990's     Medications: Current Outpatient Prescriptions  Medication Sig Dispense Refill  . insulin aspart (NOVOLOG) 100 UNIT/ML injection Inject 1-2 Units into the skin 3 (three) times daily as needed for high blood sugar (CBG>150). CBG < 150 = no insulin CBG 150-200 = 1 unit CBG 200-250 = 2 units    . ipratropium-albuterol (DUONEB) 0.5-2.5 (3) MG/3ML SOLN Take 3 mLs by nebulization every 6 (six) hours as needed. J44.1 code (Patient not taking: Reported on 08/13/2015) 360 mL 1  . levothyroxine (SYNTHROID, LEVOTHROID) 150 MCG tablet Take 150 mcg by mouth daily before breakfast.    . LORazepam (ATIVAN) 0.5 MG tablet Take 0.5 mg by mouth every 6 (six) hours as needed (for nausea and vomiting).   0  . metoCLOPramide (REGLAN) 10 MG tablet Take 10 mg by mouth 3 (three) times daily before meals.    . metoprolol tartrate (LOPRESSOR) 25 MG tablet Take 25 mg by mouth 2 (two) times daily.     . nitroGLYCERIN (NITROSTAT) 0.4 MG SL tablet Place 1 tablet (0.4 mg total) under the tongue every 5 (five) minutes as needed for chest pain. 30 tablet 0  . ondansetron (ZOFRAN ODT) 4 MG disintegrating tablet Take 1 tablet (4 mg total) by mouth every 8 (eight) hours as needed for nausea. 6 tablet 0  . ondansetron (ZOFRAN ODT) 4 MG disintegrating tablet Take 1 tablet (4 mg total) by mouth every 8 (eight) hours as needed for nausea or vomiting. 30 tablet 0  . oxyCODONE (ROXICODONE) 5 MG immediate release tablet Take  1 tablet (5 mg total) by mouth every 4 (four) hours as needed for severe pain. 15 tablet 0  . promethazine (PHENERGAN) 12.5 MG tablet Take 12.5 mg by mouth every 6 (six) hours as needed for nausea or vomiting.    . senna-docusate (SENOKOT-S) 8.6-50 MG tablet Take 1  tablet by mouth daily.    . tacrolimus (PROGRAF) 1 MG capsule Take 3 mg by mouth 2 (two) times daily.     . traMADol (ULTRAM) 50 MG tablet Take 1 tablet (50 mg total) by mouth every 12 (twelve) hours as needed for moderate pain. (Patient not taking: Reported on 08/13/2015) 60 tablet 0  . verapamil (CALAN) 120 MG tablet TAKE 1 TABLET (120 MG TOTAL) BY MOUTH AT BEDTIME. NEED OV. (Patient not taking: Reported on 08/13/2015) 15 tablet 0  . warfarin (COUMADIN) 4 MG tablet Take 2 mg by mouth daily.      No current facility-administered medications for this visit.     Allergies: Allergies  Allergen Reactions  . Acetaminophen Other (See Comments)    Liver transplant recipient   . Codeine Itching  . Mirtazapine Other (See Comments)    hallucination  . Morphine Itching and Other (See Comments)    hallucinate    Social History: The patient  reports that she has been smoking Cigarettes.  She has a 10.00 pack-year smoking history. She has never used smokeless tobacco. She reports that she does not drink alcohol or use drugs.   Family History: The patient's family history includes Cancer in her father and mother; Diabetes in her mother; Hypertension in her mother; Stroke in her mother.   Review of Systems: Please see the history of present illness.   Otherwise, the review of systems is positive for none.   All other systems are reviewed and negative.   Physical Exam: VS:  There were no vitals taken for this visit. [name]  BMI There is no height or weight on file to calculate BMI.  Wt Readings from Last 3 Encounters:  08/14/15 125 lb (56.7 kg)  08/12/15 119 lb (54 kg)  07/04/15 138 lb 9.6 oz (62.9 kg)    General: Pleasant. Well developed,  well nourished and in no acute distress.   HEENT: Normal.  Neck: Supple, no JVD, carotid bruits, or masses noted.  Cardiac: Regular rate and rhythm. No murmurs, rubs, or gallops. No edema.  Respiratory:  Lungs are clear to auscultation bilaterally with normal work of breathing.  GI: Soft and nontender.  MS: No deformity or atrophy. Gait and ROM intact.  Skin: Warm and dry. Color is normal.  Neuro:  Strength and sensation are intact and no gross focal deficits noted.  Psych: Alert, appropriate and with normal affect.   LABORATORY DATA:  EKG:  EKG is not ordered today.   Lab Results  Component Value Date   WBC 6.6 08/14/2015   HGB 9.8 (L) 08/14/2015   HCT 28.2 (L) 08/14/2015   PLT 160 08/14/2015   GLUCOSE 107 (H) 08/14/2015   CHOL 225 (H) 12/22/2011   TRIG 121 12/22/2011   HDL 97 12/22/2011   LDLCALC 104 (H) 12/22/2011   ALT 13 (L) 08/14/2015   AST 24 08/14/2015   NA 136 08/14/2015   K 3.5 08/14/2015   CL 99 (L) 08/14/2015   CREATININE 4.00 (H) 08/14/2015   BUN 6 08/14/2015   CO2 26 08/14/2015   TSH 2.242 02/19/2014   INR 1.8 09/07/2015   HGBA1C 6.7 (H) 03/30/2014   Lab Results  Component Value Date   INR 1.8 09/07/2015   INR 1.9 (A) 08/22/2015   INR 1.5 (A) 08/15/2015     BNP (last 3 results)  Recent Labs  04/21/15 0753 07/09/15 1733  BNP 2,040.6* 1,495.0*    ProBNP (last 3 results) No results for input(s): PROBNP  in the last 8760 hours.   Other Studies Reviewed Today:   Assessment/Plan: 1. HTN  2. CAD with prior CABG  3. Prior AVR  4. Chronic anticoagulation  5. ESRD - on dialysis  6. OSA  7. Chronic anticoagulation -   8. Chronic anemia -   Current medicines are reviewed with the patient today.  The patient does not have concerns regarding medicines other than what has been noted above.  The following changes have been made:  See above.  Labs/ tests ordered today include:   No orders of the defined types were placed in this  encounter.    Disposition:   FU with Dr. [name] and his team in   Patient is agreeable to this plan and will call if any problems develop in the interim.   Signed: Burtis Junes, RN, ANP-C 09/10/2015 7:50 AM  [facility] [address] [place], [place]  [address] Phone: [phone] Fax: [phone]

## 2015-09-11 DIAGNOSIS — N186 End stage renal disease: Secondary | ICD-10-CM | POA: Diagnosis not present

## 2015-09-11 DIAGNOSIS — L89152 Pressure ulcer of sacral region, stage 2: Secondary | ICD-10-CM | POA: Diagnosis not present

## 2015-09-11 DIAGNOSIS — E1122 Type 2 diabetes mellitus with diabetic chronic kidney disease: Secondary | ICD-10-CM | POA: Diagnosis not present

## 2015-09-11 DIAGNOSIS — I120 Hypertensive chronic kidney disease with stage 5 chronic kidney disease or end stage renal disease: Secondary | ICD-10-CM | POA: Diagnosis not present

## 2015-09-11 DIAGNOSIS — M6281 Muscle weakness (generalized): Secondary | ICD-10-CM | POA: Diagnosis not present

## 2015-09-11 DIAGNOSIS — S36029D Unspecified contusion of spleen, subsequent encounter: Secondary | ICD-10-CM | POA: Diagnosis not present

## 2015-09-12 ENCOUNTER — Encounter: Payer: Self-pay | Admitting: Nurse Practitioner

## 2015-09-12 DIAGNOSIS — E876 Hypokalemia: Secondary | ICD-10-CM | POA: Diagnosis not present

## 2015-09-12 DIAGNOSIS — I359 Nonrheumatic aortic valve disorder, unspecified: Secondary | ICD-10-CM | POA: Diagnosis not present

## 2015-09-12 DIAGNOSIS — D631 Anemia in chronic kidney disease: Secondary | ICD-10-CM | POA: Diagnosis not present

## 2015-09-12 DIAGNOSIS — E118 Type 2 diabetes mellitus with unspecified complications: Secondary | ICD-10-CM | POA: Diagnosis not present

## 2015-09-12 DIAGNOSIS — N186 End stage renal disease: Secondary | ICD-10-CM | POA: Diagnosis not present

## 2015-09-12 LAB — PROTIME-INR: INR: 1.8 — AB (ref 0.9–1.1)

## 2015-09-13 DIAGNOSIS — M6281 Muscle weakness (generalized): Secondary | ICD-10-CM | POA: Diagnosis not present

## 2015-09-13 DIAGNOSIS — I120 Hypertensive chronic kidney disease with stage 5 chronic kidney disease or end stage renal disease: Secondary | ICD-10-CM | POA: Diagnosis not present

## 2015-09-13 DIAGNOSIS — S36029D Unspecified contusion of spleen, subsequent encounter: Secondary | ICD-10-CM | POA: Diagnosis not present

## 2015-09-13 DIAGNOSIS — E1122 Type 2 diabetes mellitus with diabetic chronic kidney disease: Secondary | ICD-10-CM | POA: Diagnosis not present

## 2015-09-13 DIAGNOSIS — N186 End stage renal disease: Secondary | ICD-10-CM | POA: Diagnosis not present

## 2015-09-13 DIAGNOSIS — L89152 Pressure ulcer of sacral region, stage 2: Secondary | ICD-10-CM | POA: Diagnosis not present

## 2015-09-14 ENCOUNTER — Ambulatory Visit (INDEPENDENT_AMBULATORY_CARE_PROVIDER_SITE_OTHER): Payer: Medicare Other | Admitting: Pharmacist

## 2015-09-14 DIAGNOSIS — L89152 Pressure ulcer of sacral region, stage 2: Secondary | ICD-10-CM | POA: Diagnosis not present

## 2015-09-14 DIAGNOSIS — Z952 Presence of prosthetic heart valve: Secondary | ICD-10-CM

## 2015-09-14 DIAGNOSIS — D631 Anemia in chronic kidney disease: Secondary | ICD-10-CM | POA: Diagnosis not present

## 2015-09-14 DIAGNOSIS — E118 Type 2 diabetes mellitus with unspecified complications: Secondary | ICD-10-CM | POA: Diagnosis not present

## 2015-09-14 DIAGNOSIS — Z7901 Long term (current) use of anticoagulants: Secondary | ICD-10-CM

## 2015-09-14 DIAGNOSIS — N186 End stage renal disease: Secondary | ICD-10-CM | POA: Diagnosis not present

## 2015-09-14 DIAGNOSIS — I120 Hypertensive chronic kidney disease with stage 5 chronic kidney disease or end stage renal disease: Secondary | ICD-10-CM | POA: Diagnosis not present

## 2015-09-14 DIAGNOSIS — E876 Hypokalemia: Secondary | ICD-10-CM | POA: Diagnosis not present

## 2015-09-14 DIAGNOSIS — M6281 Muscle weakness (generalized): Secondary | ICD-10-CM | POA: Diagnosis not present

## 2015-09-14 DIAGNOSIS — E1122 Type 2 diabetes mellitus with diabetic chronic kidney disease: Secondary | ICD-10-CM | POA: Diagnosis not present

## 2015-09-14 DIAGNOSIS — S36029D Unspecified contusion of spleen, subsequent encounter: Secondary | ICD-10-CM | POA: Diagnosis not present

## 2015-09-14 DIAGNOSIS — Z954 Presence of other heart-valve replacement: Secondary | ICD-10-CM

## 2015-09-17 DIAGNOSIS — E1122 Type 2 diabetes mellitus with diabetic chronic kidney disease: Secondary | ICD-10-CM | POA: Diagnosis not present

## 2015-09-17 DIAGNOSIS — L89152 Pressure ulcer of sacral region, stage 2: Secondary | ICD-10-CM | POA: Diagnosis not present

## 2015-09-17 DIAGNOSIS — S36029D Unspecified contusion of spleen, subsequent encounter: Secondary | ICD-10-CM | POA: Diagnosis not present

## 2015-09-17 DIAGNOSIS — E876 Hypokalemia: Secondary | ICD-10-CM | POA: Diagnosis not present

## 2015-09-17 DIAGNOSIS — N186 End stage renal disease: Secondary | ICD-10-CM | POA: Diagnosis not present

## 2015-09-17 DIAGNOSIS — I120 Hypertensive chronic kidney disease with stage 5 chronic kidney disease or end stage renal disease: Secondary | ICD-10-CM | POA: Diagnosis not present

## 2015-09-17 DIAGNOSIS — M6281 Muscle weakness (generalized): Secondary | ICD-10-CM | POA: Diagnosis not present

## 2015-09-17 DIAGNOSIS — E118 Type 2 diabetes mellitus with unspecified complications: Secondary | ICD-10-CM | POA: Diagnosis not present

## 2015-09-17 DIAGNOSIS — D631 Anemia in chronic kidney disease: Secondary | ICD-10-CM | POA: Diagnosis not present

## 2015-09-18 DIAGNOSIS — Z944 Liver transplant status: Secondary | ICD-10-CM | POA: Diagnosis not present

## 2015-09-18 DIAGNOSIS — K651 Peritoneal abscess: Secondary | ICD-10-CM | POA: Diagnosis not present

## 2015-09-18 DIAGNOSIS — F17200 Nicotine dependence, unspecified, uncomplicated: Secondary | ICD-10-CM | POA: Diagnosis not present

## 2015-09-18 DIAGNOSIS — Z952 Presence of prosthetic heart valve: Secondary | ICD-10-CM | POA: Diagnosis not present

## 2015-09-18 DIAGNOSIS — R112 Nausea with vomiting, unspecified: Secondary | ICD-10-CM | POA: Diagnosis not present

## 2015-09-18 DIAGNOSIS — E119 Type 2 diabetes mellitus without complications: Secondary | ICD-10-CM | POA: Diagnosis not present

## 2015-09-18 DIAGNOSIS — I252 Old myocardial infarction: Secondary | ICD-10-CM | POA: Diagnosis not present

## 2015-09-18 DIAGNOSIS — N19 Unspecified kidney failure: Secondary | ICD-10-CM | POA: Diagnosis not present

## 2015-09-18 DIAGNOSIS — Z9889 Other specified postprocedural states: Secondary | ICD-10-CM | POA: Diagnosis not present

## 2015-09-18 DIAGNOSIS — K2970 Gastritis, unspecified, without bleeding: Secondary | ICD-10-CM | POA: Diagnosis not present

## 2015-09-18 DIAGNOSIS — I120 Hypertensive chronic kidney disease with stage 5 chronic kidney disease or end stage renal disease: Secondary | ICD-10-CM | POA: Diagnosis not present

## 2015-09-18 DIAGNOSIS — Z7901 Long term (current) use of anticoagulants: Secondary | ICD-10-CM | POA: Diagnosis not present

## 2015-09-18 DIAGNOSIS — Z951 Presence of aortocoronary bypass graft: Secondary | ICD-10-CM | POA: Diagnosis not present

## 2015-09-18 DIAGNOSIS — Z992 Dependence on renal dialysis: Secondary | ICD-10-CM | POA: Diagnosis not present

## 2015-09-19 DIAGNOSIS — Z7901 Long term (current) use of anticoagulants: Secondary | ICD-10-CM | POA: Diagnosis not present

## 2015-09-19 DIAGNOSIS — K5900 Constipation, unspecified: Secondary | ICD-10-CM | POA: Diagnosis not present

## 2015-09-19 DIAGNOSIS — D649 Anemia, unspecified: Secondary | ICD-10-CM | POA: Diagnosis not present

## 2015-09-19 DIAGNOSIS — R279 Unspecified lack of coordination: Secondary | ICD-10-CM | POA: Diagnosis not present

## 2015-09-19 DIAGNOSIS — A419 Sepsis, unspecified organism: Secondary | ICD-10-CM | POA: Diagnosis not present

## 2015-09-19 DIAGNOSIS — L89152 Pressure ulcer of sacral region, stage 2: Secondary | ICD-10-CM | POA: Diagnosis present

## 2015-09-19 DIAGNOSIS — J449 Chronic obstructive pulmonary disease, unspecified: Secondary | ICD-10-CM | POA: Diagnosis present

## 2015-09-19 DIAGNOSIS — I2510 Atherosclerotic heart disease of native coronary artery without angina pectoris: Secondary | ICD-10-CM | POA: Diagnosis present

## 2015-09-19 DIAGNOSIS — Z951 Presence of aortocoronary bypass graft: Secondary | ICD-10-CM | POA: Diagnosis not present

## 2015-09-19 DIAGNOSIS — K651 Peritoneal abscess: Secondary | ICD-10-CM | POA: Diagnosis not present

## 2015-09-19 DIAGNOSIS — Z952 Presence of prosthetic heart valve: Secondary | ICD-10-CM | POA: Diagnosis not present

## 2015-09-19 DIAGNOSIS — L03119 Cellulitis of unspecified part of limb: Secondary | ICD-10-CM | POA: Diagnosis not present

## 2015-09-19 DIAGNOSIS — R1012 Left upper quadrant pain: Secondary | ICD-10-CM | POA: Diagnosis not present

## 2015-09-19 DIAGNOSIS — Z944 Liver transplant status: Secondary | ICD-10-CM | POA: Diagnosis not present

## 2015-09-19 DIAGNOSIS — R1084 Generalized abdominal pain: Secondary | ICD-10-CM | POA: Diagnosis not present

## 2015-09-19 DIAGNOSIS — E119 Type 2 diabetes mellitus without complications: Secondary | ICD-10-CM | POA: Diagnosis not present

## 2015-09-19 DIAGNOSIS — G8918 Other acute postprocedural pain: Secondary | ICD-10-CM | POA: Diagnosis not present

## 2015-09-19 DIAGNOSIS — R1110 Vomiting, unspecified: Secondary | ICD-10-CM | POA: Diagnosis not present

## 2015-09-19 DIAGNOSIS — K7460 Unspecified cirrhosis of liver: Secondary | ICD-10-CM | POA: Diagnosis not present

## 2015-09-19 DIAGNOSIS — Z992 Dependence on renal dialysis: Secondary | ICD-10-CM | POA: Diagnosis not present

## 2015-09-19 DIAGNOSIS — Z9889 Other specified postprocedural states: Secondary | ICD-10-CM | POA: Diagnosis not present

## 2015-09-19 DIAGNOSIS — R933 Abnormal findings on diagnostic imaging of other parts of digestive tract: Secondary | ICD-10-CM | POA: Diagnosis not present

## 2015-09-19 DIAGNOSIS — R112 Nausea with vomiting, unspecified: Secondary | ICD-10-CM | POA: Diagnosis not present

## 2015-09-19 DIAGNOSIS — F419 Anxiety disorder, unspecified: Secondary | ICD-10-CM | POA: Diagnosis present

## 2015-09-19 DIAGNOSIS — D631 Anemia in chronic kidney disease: Secondary | ICD-10-CM | POA: Diagnosis not present

## 2015-09-19 DIAGNOSIS — I120 Hypertensive chronic kidney disease with stage 5 chronic kidney disease or end stage renal disease: Secondary | ICD-10-CM | POA: Diagnosis present

## 2015-09-19 DIAGNOSIS — F17210 Nicotine dependence, cigarettes, uncomplicated: Secondary | ICD-10-CM | POA: Diagnosis present

## 2015-09-19 DIAGNOSIS — Z9081 Acquired absence of spleen: Secondary | ICD-10-CM | POA: Diagnosis not present

## 2015-09-19 DIAGNOSIS — N186 End stage renal disease: Secondary | ICD-10-CM | POA: Diagnosis present

## 2015-09-19 DIAGNOSIS — L89621 Pressure ulcer of left heel, stage 1: Secondary | ICD-10-CM | POA: Diagnosis present

## 2015-09-19 DIAGNOSIS — E039 Hypothyroidism, unspecified: Secondary | ICD-10-CM | POA: Diagnosis present

## 2015-09-19 DIAGNOSIS — Z66 Do not resuscitate: Secondary | ICD-10-CM | POA: Diagnosis present

## 2015-09-19 DIAGNOSIS — D7833 Postprocedural seroma of the spleen following a procedure on the spleen: Secondary | ICD-10-CM | POA: Diagnosis not present

## 2015-09-19 DIAGNOSIS — Z7982 Long term (current) use of aspirin: Secondary | ICD-10-CM | POA: Diagnosis not present

## 2015-09-19 DIAGNOSIS — N250 Renal osteodystrophy: Secondary | ICD-10-CM | POA: Diagnosis not present

## 2015-09-19 DIAGNOSIS — G8929 Other chronic pain: Secondary | ICD-10-CM | POA: Diagnosis present

## 2015-09-19 DIAGNOSIS — N289 Disorder of kidney and ureter, unspecified: Secondary | ICD-10-CM | POA: Diagnosis not present

## 2015-09-19 DIAGNOSIS — I252 Old myocardial infarction: Secondary | ICD-10-CM | POA: Diagnosis not present

## 2015-09-19 DIAGNOSIS — F329 Major depressive disorder, single episode, unspecified: Secondary | ICD-10-CM | POA: Diagnosis present

## 2015-09-19 DIAGNOSIS — Z743 Need for continuous supervision: Secondary | ICD-10-CM | POA: Diagnosis not present

## 2015-09-19 DIAGNOSIS — D7831 Postprocedural hematoma of the spleen following a procedure on the spleen: Secondary | ICD-10-CM | POA: Diagnosis not present

## 2015-09-19 DIAGNOSIS — N19 Unspecified kidney failure: Secondary | ICD-10-CM | POA: Diagnosis not present

## 2015-09-19 DIAGNOSIS — Z6820 Body mass index (BMI) 20.0-20.9, adult: Secondary | ICD-10-CM | POA: Diagnosis not present

## 2015-09-19 DIAGNOSIS — F05 Delirium due to known physiological condition: Secondary | ICD-10-CM | POA: Diagnosis not present

## 2015-09-19 DIAGNOSIS — R188 Other ascites: Secondary | ICD-10-CM | POA: Diagnosis not present

## 2015-09-19 DIAGNOSIS — Z794 Long term (current) use of insulin: Secondary | ICD-10-CM | POA: Diagnosis not present

## 2015-09-19 DIAGNOSIS — E1122 Type 2 diabetes mellitus with diabetic chronic kidney disease: Secondary | ICD-10-CM | POA: Diagnosis present

## 2015-09-20 ENCOUNTER — Encounter: Payer: Self-pay | Admitting: Vascular Surgery

## 2015-09-25 ENCOUNTER — Ambulatory Visit: Payer: Medicare Other | Admitting: Vascular Surgery

## 2015-09-25 ENCOUNTER — Ambulatory Visit (HOSPITAL_COMMUNITY): Payer: Medicare Other

## 2015-09-26 DIAGNOSIS — I120 Hypertensive chronic kidney disease with stage 5 chronic kidney disease or end stage renal disease: Secondary | ICD-10-CM | POA: Diagnosis not present

## 2015-09-26 DIAGNOSIS — I359 Nonrheumatic aortic valve disorder, unspecified: Secondary | ICD-10-CM | POA: Diagnosis not present

## 2015-09-26 DIAGNOSIS — S36029D Unspecified contusion of spleen, subsequent encounter: Secondary | ICD-10-CM | POA: Diagnosis not present

## 2015-09-26 DIAGNOSIS — E876 Hypokalemia: Secondary | ICD-10-CM | POA: Diagnosis not present

## 2015-09-26 DIAGNOSIS — M6281 Muscle weakness (generalized): Secondary | ICD-10-CM | POA: Diagnosis not present

## 2015-09-26 DIAGNOSIS — N186 End stage renal disease: Secondary | ICD-10-CM | POA: Diagnosis not present

## 2015-09-26 DIAGNOSIS — E118 Type 2 diabetes mellitus with unspecified complications: Secondary | ICD-10-CM | POA: Diagnosis not present

## 2015-09-26 DIAGNOSIS — D631 Anemia in chronic kidney disease: Secondary | ICD-10-CM | POA: Diagnosis not present

## 2015-09-26 DIAGNOSIS — L89152 Pressure ulcer of sacral region, stage 2: Secondary | ICD-10-CM | POA: Diagnosis not present

## 2015-09-26 DIAGNOSIS — E1122 Type 2 diabetes mellitus with diabetic chronic kidney disease: Secondary | ICD-10-CM | POA: Diagnosis not present

## 2015-09-26 LAB — PROTIME-INR: INR: 2.8 — AB (ref 0.9–1.1)

## 2015-09-27 DIAGNOSIS — N186 End stage renal disease: Secondary | ICD-10-CM | POA: Diagnosis not present

## 2015-09-27 DIAGNOSIS — E1122 Type 2 diabetes mellitus with diabetic chronic kidney disease: Secondary | ICD-10-CM | POA: Diagnosis not present

## 2015-09-27 DIAGNOSIS — L89152 Pressure ulcer of sacral region, stage 2: Secondary | ICD-10-CM | POA: Diagnosis not present

## 2015-09-27 DIAGNOSIS — M6281 Muscle weakness (generalized): Secondary | ICD-10-CM | POA: Diagnosis not present

## 2015-09-27 DIAGNOSIS — I120 Hypertensive chronic kidney disease with stage 5 chronic kidney disease or end stage renal disease: Secondary | ICD-10-CM | POA: Diagnosis not present

## 2015-09-27 DIAGNOSIS — S36029D Unspecified contusion of spleen, subsequent encounter: Secondary | ICD-10-CM | POA: Diagnosis not present

## 2015-09-28 DIAGNOSIS — M6281 Muscle weakness (generalized): Secondary | ICD-10-CM | POA: Diagnosis not present

## 2015-09-28 DIAGNOSIS — L89152 Pressure ulcer of sacral region, stage 2: Secondary | ICD-10-CM | POA: Diagnosis not present

## 2015-09-28 DIAGNOSIS — N186 End stage renal disease: Secondary | ICD-10-CM | POA: Diagnosis not present

## 2015-09-28 DIAGNOSIS — S36029D Unspecified contusion of spleen, subsequent encounter: Secondary | ICD-10-CM | POA: Diagnosis not present

## 2015-09-28 DIAGNOSIS — E1122 Type 2 diabetes mellitus with diabetic chronic kidney disease: Secondary | ICD-10-CM | POA: Diagnosis not present

## 2015-09-28 DIAGNOSIS — E876 Hypokalemia: Secondary | ICD-10-CM | POA: Diagnosis not present

## 2015-09-28 DIAGNOSIS — I120 Hypertensive chronic kidney disease with stage 5 chronic kidney disease or end stage renal disease: Secondary | ICD-10-CM | POA: Diagnosis not present

## 2015-09-28 DIAGNOSIS — E118 Type 2 diabetes mellitus with unspecified complications: Secondary | ICD-10-CM | POA: Diagnosis not present

## 2015-09-28 DIAGNOSIS — D631 Anemia in chronic kidney disease: Secondary | ICD-10-CM | POA: Diagnosis not present

## 2015-10-01 ENCOUNTER — Ambulatory Visit (INDEPENDENT_AMBULATORY_CARE_PROVIDER_SITE_OTHER): Payer: Medicare Other | Admitting: Pharmacist

## 2015-10-01 DIAGNOSIS — Z954 Presence of other heart-valve replacement: Secondary | ICD-10-CM

## 2015-10-01 DIAGNOSIS — N186 End stage renal disease: Secondary | ICD-10-CM | POA: Diagnosis not present

## 2015-10-01 DIAGNOSIS — K6289 Other specified diseases of anus and rectum: Secondary | ICD-10-CM | POA: Diagnosis not present

## 2015-10-01 DIAGNOSIS — E118 Type 2 diabetes mellitus with unspecified complications: Secondary | ICD-10-CM | POA: Diagnosis not present

## 2015-10-01 DIAGNOSIS — K5900 Constipation, unspecified: Secondary | ICD-10-CM | POA: Diagnosis not present

## 2015-10-01 DIAGNOSIS — K5641 Fecal impaction: Secondary | ICD-10-CM | POA: Diagnosis not present

## 2015-10-01 DIAGNOSIS — E876 Hypokalemia: Secondary | ICD-10-CM | POA: Diagnosis not present

## 2015-10-01 DIAGNOSIS — Z952 Presence of prosthetic heart valve: Secondary | ICD-10-CM

## 2015-10-01 DIAGNOSIS — Z7901 Long term (current) use of anticoagulants: Secondary | ICD-10-CM

## 2015-10-01 DIAGNOSIS — D631 Anemia in chronic kidney disease: Secondary | ICD-10-CM | POA: Diagnosis not present

## 2015-10-02 DIAGNOSIS — L89152 Pressure ulcer of sacral region, stage 2: Secondary | ICD-10-CM | POA: Diagnosis not present

## 2015-10-02 DIAGNOSIS — E1122 Type 2 diabetes mellitus with diabetic chronic kidney disease: Secondary | ICD-10-CM | POA: Diagnosis not present

## 2015-10-02 DIAGNOSIS — I120 Hypertensive chronic kidney disease with stage 5 chronic kidney disease or end stage renal disease: Secondary | ICD-10-CM | POA: Diagnosis not present

## 2015-10-02 DIAGNOSIS — S36029D Unspecified contusion of spleen, subsequent encounter: Secondary | ICD-10-CM | POA: Diagnosis not present

## 2015-10-02 DIAGNOSIS — N186 End stage renal disease: Secondary | ICD-10-CM | POA: Diagnosis not present

## 2015-10-02 DIAGNOSIS — M6281 Muscle weakness (generalized): Secondary | ICD-10-CM | POA: Diagnosis not present

## 2015-10-03 DIAGNOSIS — I10 Essential (primary) hypertension: Secondary | ICD-10-CM | POA: Diagnosis not present

## 2015-10-03 DIAGNOSIS — I359 Nonrheumatic aortic valve disorder, unspecified: Secondary | ICD-10-CM | POA: Diagnosis not present

## 2015-10-03 DIAGNOSIS — E118 Type 2 diabetes mellitus with unspecified complications: Secondary | ICD-10-CM | POA: Diagnosis not present

## 2015-10-03 DIAGNOSIS — D631 Anemia in chronic kidney disease: Secondary | ICD-10-CM | POA: Diagnosis not present

## 2015-10-03 DIAGNOSIS — E119 Type 2 diabetes mellitus without complications: Secondary | ICD-10-CM | POA: Diagnosis not present

## 2015-10-03 DIAGNOSIS — I509 Heart failure, unspecified: Secondary | ICD-10-CM | POA: Diagnosis not present

## 2015-10-03 DIAGNOSIS — E876 Hypokalemia: Secondary | ICD-10-CM | POA: Diagnosis not present

## 2015-10-03 DIAGNOSIS — L8990 Pressure ulcer of unspecified site, unspecified stage: Secondary | ICD-10-CM | POA: Diagnosis not present

## 2015-10-03 DIAGNOSIS — N186 End stage renal disease: Secondary | ICD-10-CM | POA: Diagnosis not present

## 2015-10-03 LAB — PROTIME-INR: INR: 1.7 — AB (ref <=1.1)

## 2015-10-04 DIAGNOSIS — S36029D Unspecified contusion of spleen, subsequent encounter: Secondary | ICD-10-CM | POA: Diagnosis not present

## 2015-10-04 DIAGNOSIS — N186 End stage renal disease: Secondary | ICD-10-CM | POA: Diagnosis not present

## 2015-10-04 DIAGNOSIS — M6281 Muscle weakness (generalized): Secondary | ICD-10-CM | POA: Diagnosis not present

## 2015-10-04 DIAGNOSIS — E1122 Type 2 diabetes mellitus with diabetic chronic kidney disease: Secondary | ICD-10-CM | POA: Diagnosis not present

## 2015-10-04 DIAGNOSIS — I120 Hypertensive chronic kidney disease with stage 5 chronic kidney disease or end stage renal disease: Secondary | ICD-10-CM | POA: Diagnosis not present

## 2015-10-04 DIAGNOSIS — L89152 Pressure ulcer of sacral region, stage 2: Secondary | ICD-10-CM | POA: Diagnosis not present

## 2015-10-05 ENCOUNTER — Ambulatory Visit (INDEPENDENT_AMBULATORY_CARE_PROVIDER_SITE_OTHER): Payer: Medicare Other | Admitting: Pharmacist Clinician (PhC)/ Clinical Pharmacy Specialist

## 2015-10-05 DIAGNOSIS — Z7901 Long term (current) use of anticoagulants: Secondary | ICD-10-CM

## 2015-10-05 DIAGNOSIS — D631 Anemia in chronic kidney disease: Secondary | ICD-10-CM | POA: Diagnosis not present

## 2015-10-05 DIAGNOSIS — Z952 Presence of prosthetic heart valve: Secondary | ICD-10-CM

## 2015-10-05 DIAGNOSIS — Z954 Presence of other heart-valve replacement: Secondary | ICD-10-CM

## 2015-10-05 DIAGNOSIS — E118 Type 2 diabetes mellitus with unspecified complications: Secondary | ICD-10-CM | POA: Diagnosis not present

## 2015-10-05 DIAGNOSIS — E876 Hypokalemia: Secondary | ICD-10-CM | POA: Diagnosis not present

## 2015-10-05 DIAGNOSIS — N186 End stage renal disease: Secondary | ICD-10-CM | POA: Diagnosis not present

## 2015-10-08 ENCOUNTER — Encounter (HOSPITAL_COMMUNITY): Payer: Self-pay | Admitting: Emergency Medicine

## 2015-10-08 ENCOUNTER — Emergency Department (HOSPITAL_COMMUNITY): Payer: Medicare Other

## 2015-10-08 ENCOUNTER — Inpatient Hospital Stay (HOSPITAL_COMMUNITY)
Admission: EM | Admit: 2015-10-08 | Discharge: 2015-10-16 | DRG: 555 | Disposition: A | Discharge status: Home / Self Care | Payer: Medicare Other | Attending: Internal Medicine | Admitting: Internal Medicine

## 2015-10-08 ENCOUNTER — Telehealth: Payer: Self-pay

## 2015-10-08 DIAGNOSIS — B1920 Unspecified viral hepatitis C without hepatic coma: Secondary | ICD-10-CM | POA: Diagnosis present

## 2015-10-08 DIAGNOSIS — M7989 Other specified soft tissue disorders: Principal | ICD-10-CM | POA: Diagnosis present

## 2015-10-08 DIAGNOSIS — Z992 Dependence on renal dialysis: Secondary | ICD-10-CM

## 2015-10-08 DIAGNOSIS — E1165 Type 2 diabetes mellitus with hyperglycemia: Secondary | ICD-10-CM | POA: Diagnosis not present

## 2015-10-08 DIAGNOSIS — Z79899 Other long term (current) drug therapy: Secondary | ICD-10-CM

## 2015-10-08 DIAGNOSIS — E039 Hypothyroidism, unspecified: Secondary | ICD-10-CM | POA: Diagnosis present

## 2015-10-08 DIAGNOSIS — Z951 Presence of aortocoronary bypass graft: Secondary | ICD-10-CM

## 2015-10-08 DIAGNOSIS — I5032 Chronic diastolic (congestive) heart failure: Secondary | ICD-10-CM

## 2015-10-08 DIAGNOSIS — M549 Dorsalgia, unspecified: Secondary | ICD-10-CM | POA: Diagnosis present

## 2015-10-08 DIAGNOSIS — N2581 Secondary hyperparathyroidism of renal origin: Secondary | ICD-10-CM | POA: Diagnosis present

## 2015-10-08 DIAGNOSIS — Z944 Liver transplant status: Secondary | ICD-10-CM

## 2015-10-08 DIAGNOSIS — N186 End stage renal disease: Secondary | ICD-10-CM | POA: Diagnosis present

## 2015-10-08 DIAGNOSIS — I2510 Atherosclerotic heart disease of native coronary artery without angina pectoris: Secondary | ICD-10-CM | POA: Diagnosis present

## 2015-10-08 DIAGNOSIS — Z833 Family history of diabetes mellitus: Secondary | ICD-10-CM

## 2015-10-08 DIAGNOSIS — K5909 Other constipation: Secondary | ICD-10-CM | POA: Diagnosis present

## 2015-10-08 DIAGNOSIS — Z886 Allergy status to analgesic agent status: Secondary | ICD-10-CM

## 2015-10-08 DIAGNOSIS — F17210 Nicotine dependence, cigarettes, uncomplicated: Secondary | ICD-10-CM | POA: Diagnosis present

## 2015-10-08 DIAGNOSIS — I120 Hypertensive chronic kidney disease with stage 5 chronic kidney disease or end stage renal disease: Secondary | ICD-10-CM | POA: Diagnosis present

## 2015-10-08 DIAGNOSIS — E118 Type 2 diabetes mellitus with unspecified complications: Secondary | ICD-10-CM | POA: Diagnosis not present

## 2015-10-08 DIAGNOSIS — F329 Major depressive disorder, single episode, unspecified: Secondary | ICD-10-CM | POA: Diagnosis present

## 2015-10-08 DIAGNOSIS — Z9081 Acquired absence of spleen: Secondary | ICD-10-CM

## 2015-10-08 DIAGNOSIS — Z7901 Long term (current) use of anticoagulants: Secondary | ICD-10-CM

## 2015-10-08 DIAGNOSIS — Z6820 Body mass index (BMI) 20.0-20.9, adult: Secondary | ICD-10-CM

## 2015-10-08 DIAGNOSIS — Z794 Long term (current) use of insulin: Secondary | ICD-10-CM

## 2015-10-08 DIAGNOSIS — E43 Unspecified severe protein-calorie malnutrition: Secondary | ICD-10-CM | POA: Diagnosis present

## 2015-10-08 DIAGNOSIS — Z952 Presence of prosthetic heart valve: Secondary | ICD-10-CM

## 2015-10-08 DIAGNOSIS — D631 Anemia in chronic kidney disease: Secondary | ICD-10-CM | POA: Diagnosis present

## 2015-10-08 DIAGNOSIS — L03119 Cellulitis of unspecified part of limb: Secondary | ICD-10-CM | POA: Diagnosis not present

## 2015-10-08 DIAGNOSIS — G8929 Other chronic pain: Secondary | ICD-10-CM | POA: Diagnosis present

## 2015-10-08 DIAGNOSIS — L03116 Cellulitis of left lower limb: Secondary | ICD-10-CM

## 2015-10-08 DIAGNOSIS — E669 Obesity, unspecified: Secondary | ICD-10-CM | POA: Diagnosis present

## 2015-10-08 DIAGNOSIS — Z888 Allergy status to other drugs, medicaments and biological substances status: Secondary | ICD-10-CM

## 2015-10-08 DIAGNOSIS — K219 Gastro-esophageal reflux disease without esophagitis: Secondary | ICD-10-CM | POA: Diagnosis present

## 2015-10-08 DIAGNOSIS — Z8249 Family history of ischemic heart disease and other diseases of the circulatory system: Secondary | ICD-10-CM

## 2015-10-08 DIAGNOSIS — F419 Anxiety disorder, unspecified: Secondary | ICD-10-CM | POA: Diagnosis present

## 2015-10-08 DIAGNOSIS — E1122 Type 2 diabetes mellitus with diabetic chronic kidney disease: Secondary | ICD-10-CM | POA: Diagnosis present

## 2015-10-08 DIAGNOSIS — E1151 Type 2 diabetes mellitus with diabetic peripheral angiopathy without gangrene: Secondary | ICD-10-CM | POA: Diagnosis present

## 2015-10-08 DIAGNOSIS — L97429 Non-pressure chronic ulcer of left heel and midfoot with unspecified severity: Secondary | ICD-10-CM | POA: Diagnosis not present

## 2015-10-08 DIAGNOSIS — Z885 Allergy status to narcotic agent status: Secondary | ICD-10-CM

## 2015-10-08 DIAGNOSIS — J449 Chronic obstructive pulmonary disease, unspecified: Secondary | ICD-10-CM | POA: Diagnosis present

## 2015-10-08 DIAGNOSIS — E876 Hypokalemia: Secondary | ICD-10-CM | POA: Diagnosis not present

## 2015-10-08 DIAGNOSIS — Z9049 Acquired absence of other specified parts of digestive tract: Secondary | ICD-10-CM

## 2015-10-08 LAB — BASIC METABOLIC PANEL
ANION GAP: 9 (ref 5–15)
BUN: <5 mg/dL — ABNORMAL LOW (ref 6–20)
CALCIUM: 8.6 mg/dL — AB (ref 8.9–10.3)
CO2: 28 mmol/L (ref 22–32)
CREATININE: 2.58 mg/dL — AB (ref 0.44–1.00)
Chloride: 101 mmol/L (ref 101–111)
GFR calc Af Amer: 22 mL/min — ABNORMAL LOW (ref >60)
GFR calc non Af Amer: 19 mL/min — ABNORMAL LOW (ref >60)
GLUCOSE: 170 mg/dL — AB (ref 65–99)
Potassium: 3.9 mmol/L (ref 3.5–5.1)
Sodium: 138 mmol/L (ref 135–145)

## 2015-10-08 LAB — CBC
HCT: 32.9 % — ABNORMAL LOW (ref 36.0–46.0)
HEMOGLOBIN: 10.7 g/dL — AB (ref 12.0–15.0)
MCH: 29.5 pg (ref 26.0–34.0)
MCHC: 32.5 g/dL (ref 30.0–36.0)
MCV: 90.6 fL (ref 78.0–100.0)
Platelets: 130 10*3/uL — ABNORMAL LOW (ref 150–400)
RBC: 3.63 MIL/uL — ABNORMAL LOW (ref 3.87–5.11)
RDW: 16 % — ABNORMAL HIGH (ref 11.5–15.5)
WBC: 7.9 10*3/uL (ref 4.0–10.5)

## 2015-10-08 LAB — PROTIME-INR
INR: 1.21
PROTHROMBIN TIME: 15.4 s — AB (ref 11.4–15.2)

## 2015-10-08 LAB — GLUCOSE, CAPILLARY: Glucose-Capillary: 141 mg/dL — ABNORMAL HIGH (ref 65–99)

## 2015-10-08 MED ORDER — ALBUTEROL SULFATE (2.5 MG/3ML) 0.083% IN NEBU: 2.5000 mg | INHALATION_SOLUTION | RESPIRATORY_TRACT | Status: DC | PRN

## 2015-10-08 MED ORDER — METOPROLOL TARTRATE 12.5 MG HALF TABLET
12.5000 mg | ORAL_TABLET | Freq: Two times a day (BID) | ORAL | Status: DC
  Administered 2015-10-09 – 2015-10-16 (×14): 12.5 mg via ORAL
  Filled 2015-10-08 (×15): qty 1

## 2015-10-08 MED ORDER — SENNOSIDES-DOCUSATE SODIUM 8.6-50 MG PO TABS
1.0000 | ORAL_TABLET | Freq: Every day | ORAL | Status: DC
  Administered 2015-10-09 – 2015-10-16 (×7): 1 via ORAL
  Filled 2015-10-08 (×7): qty 1

## 2015-10-08 MED ORDER — OXYCODONE HCL 5 MG PO TABS
5.0000 mg | ORAL_TABLET | ORAL | Status: DC | PRN
  Administered 2015-10-09: 10 mg via ORAL
  Administered 2015-10-09: 5 mg via ORAL
  Administered 2015-10-09: 10 mg via ORAL
  Administered 2015-10-09 – 2015-10-10 (×2): 5 mg via ORAL
  Administered 2015-10-10 – 2015-10-14 (×2): 10 mg via ORAL
  Filled 2015-10-08 (×2): qty 2
  Filled 2015-10-08: qty 1
  Filled 2015-10-08 (×2): qty 2
  Filled 2015-10-08: qty 1
  Filled 2015-10-08: qty 2

## 2015-10-08 MED ORDER — LEVOTHYROXINE SODIUM 75 MCG PO TABS
150.0000 ug | ORAL_TABLET | Freq: Every day | ORAL | Status: DC
  Administered 2015-10-09 – 2015-10-16 (×7): 150 ug via ORAL
  Filled 2015-10-08 (×7): qty 2

## 2015-10-08 MED ORDER — ONDANSETRON 4 MG PO TBDP: 4.0000 mg | ORAL_TABLET | Freq: Three times a day (TID) | ORAL | Status: DC | PRN

## 2015-10-08 MED ORDER — OXYCODONE-ACETAMINOPHEN 7.5-325 MG PO TABS: 1.0000 | ORAL_TABLET | ORAL | Status: DC | PRN

## 2015-10-08 MED ORDER — OXYCODONE-ACETAMINOPHEN 5-325 MG PO TABS: 1.0000 | ORAL_TABLET | Freq: Four times a day (QID) | ORAL | Status: DC | PRN

## 2015-10-08 MED ORDER — WARFARIN - PHYSICIAN DOSING INPATIENT: Freq: Every day | Status: DC

## 2015-10-08 MED ORDER — LIDOCAINE 5 % EX PTCH: 1.0000 | MEDICATED_PATCH | Freq: Every day | CUTANEOUS | Status: DC | PRN

## 2015-10-08 MED ORDER — VANCOMYCIN HCL IN DEXTROSE 1-5 GM/200ML-% IV SOLN
1000.0000 mg | Freq: Once | INTRAVENOUS | Status: AC
  Administered 2015-10-08: 1000 mg via INTRAVENOUS
  Filled 2015-10-08: qty 200

## 2015-10-08 MED ORDER — METRONIDAZOLE IN NACL 5-0.79 MG/ML-% IV SOLN
500.0000 mg | Freq: Three times a day (TID) | INTRAVENOUS | Status: DC
  Administered 2015-10-09 – 2015-10-10 (×5): 500 mg via INTRAVENOUS
  Filled 2015-10-08 (×5): qty 100

## 2015-10-08 MED ORDER — INSULIN ASPART 100 UNIT/ML ~~LOC~~ SOLN: 1.0000 [IU] | Freq: Three times a day (TID) | SUBCUTANEOUS | Status: DC | PRN

## 2015-10-08 MED ORDER — ONDANSETRON HCL 4 MG/2ML IJ SOLN
4.0000 mg | Freq: Four times a day (QID) | INTRAMUSCULAR | Status: DC | PRN
  Administered 2015-10-09 – 2015-10-14 (×2): 4 mg via INTRAVENOUS
  Filled 2015-10-08 (×2): qty 2

## 2015-10-08 MED ORDER — ONDANSETRON HCL 4 MG PO TABS
4.0000 mg | ORAL_TABLET | Freq: Four times a day (QID) | ORAL | Status: DC | PRN
  Filled 2015-10-08: qty 1

## 2015-10-08 MED ORDER — WARFARIN SODIUM 2 MG PO TABS
2.0000 mg | ORAL_TABLET | Freq: Every day | ORAL | Status: DC
  Filled 2015-10-08: qty 1

## 2015-10-08 MED ORDER — TACROLIMUS 1 MG PO CAPS
2.0000 mg | ORAL_CAPSULE | Freq: Every day | ORAL | Status: DC
  Administered 2015-10-09 – 2015-10-16 (×7): 2 mg via ORAL
  Filled 2015-10-08 (×7): qty 2

## 2015-10-08 MED ORDER — GABAPENTIN 300 MG PO CAPS
300.0000 mg | ORAL_CAPSULE | Freq: Every evening | ORAL | Status: DC
  Administered 2015-10-09 – 2015-10-14 (×7): 300 mg via ORAL
  Filled 2015-10-08 (×8): qty 1

## 2015-10-08 MED ORDER — LIDOCAINE 5 % EX PTCH
1.0000 | MEDICATED_PATCH | Freq: Every day | CUTANEOUS | Status: DC
  Administered 2015-10-09: 1 via TRANSDERMAL
  Filled 2015-10-08 (×8): qty 1

## 2015-10-08 MED ORDER — TACROLIMUS 1 MG PO CAPS: 1.0000 mg | ORAL_CAPSULE | Freq: Two times a day (BID) | ORAL | Status: DC

## 2015-10-08 MED ORDER — NITROGLYCERIN 0.4 MG SL SUBL: 0.4000 mg | SUBLINGUAL_TABLET | SUBLINGUAL | Status: DC | PRN

## 2015-10-08 MED ORDER — PIPERACILLIN-TAZOBACTAM 3.375 G IVPB 30 MIN
3.3750 g | Freq: Once | INTRAVENOUS | Status: AC
  Administered 2015-10-08: 3.375 g via INTRAVENOUS
  Filled 2015-10-08: qty 50

## 2015-10-08 MED ORDER — LORAZEPAM 0.5 MG PO TABS: 0.5000 mg | ORAL_TABLET | Freq: Four times a day (QID) | ORAL | Status: DC | PRN

## 2015-10-08 MED ORDER — TACROLIMUS 1 MG PO CAPS
1.0000 mg | ORAL_CAPSULE | Freq: Every day | ORAL | Status: DC
  Administered 2015-10-09 – 2015-10-14 (×7): 1 mg via ORAL
  Filled 2015-10-08 (×8): qty 1

## 2015-10-08 MED ORDER — OXYCODONE HCL 5 MG PO TABS
5.0000 mg | ORAL_TABLET | Freq: Once | ORAL | Status: AC
  Administered 2015-10-08: 5 mg via ORAL
  Filled 2015-10-08: qty 1

## 2015-10-08 NOTE — H&P (Signed)
History and Physical    [name] DOB: [birth year] DOA: 10/08/2015  Referring MD/NP/PA: Dr. Rex Kras PCP: [name], MD  Patient coming from: home  Chief Complaint: left foot pain and swelling  HPI: [name] is a 60 y.o. female with medical history significant of ESRD on HD(M/W/F), DM type 2, s/p Liver transplant, on chronic anticoagulation, CAD, hypothyroidism, PVD, COPD; who presents with complaints of progressively worsening foot pain over the last 4 weeks. Pain is located in the left heel and it is described as sharp and stabbing in nature. Initially, symptoms were intermittent, but have become more constant and severe. Associated symptoms included swelling of the foot, more darkened appearance and discoloration of the toes on the left foot, foot is cold to the touch, and numbness sensation of the toes over the last week or so. Bearing weight on the affected foot worsens symptoms. She takes oxycodone with some relief of symptoms. Patient's daughter called her vascular surgeon's office today and it was recommended for her to come to the hospital for further evaluation. She reports that she's been in and out of hospital recently and is s/p left nephrectomy secondary to a hematoma approximately 4 weeks ago and subsequently s/p splenectomy approximately 2 weeks ago. She reports that she had been restarted on her Coumadin, and her last INR was subtherapeutic at 1.85 days ago.   ED Course: Upon admission into the emergency department patient was evaluated and seen to have vital signs within normal limits. Lab work revealed a creatinine of 2.58(improved) and a blood glucose of 170. X-rays of the foot showed no acute bony abnormalities. While in the ED the patient was checked and had Doppler pulses present in the left foot. While in the ED patient was started on broad coverage antibiotics of vancomycin and Zosyn and TRH called to admit.  Review of Systems: As per HPI otherwise 10  point review of systems negative.   Past Medical History:  Diagnosis Date  . Anemia    takes Folic Acid daily  . Anxiety   . Aortic stenosis   . Arthritis    "left hand, back" (08/30/2012)  . Asthma   . CAD (coronary artery disease)   . CAD (coronary artery disease) Jan. 2015   Cath: 20% LAD, 50% D1; s/p LIMA-LAD  . Chronic back pain   . Chronic bronchitis ([place])    "q yr w/season changes" (08/30/2012)  . Chronic constipation    takes MIralax and Colace daily  . COPD (chronic obstructive pulmonary disease) ([place])   . Depression    takes Cymbalta for "severe" depression  . End stage renal disease on dialysis [facility]) 02/27/2011   Kidneys shut down at time of liver transplant in Sept 2011 at [facility] in [place], she has been on HD ever since.  Dialyzes at [facility] HD on TTS schedule.  Had L forearm graft used 10 months then removed Dec 2012 due to suspected infection.  A right upper arm AVG was placed Dec 2012 but she developed steal symptoms acutely and it was ligated the same day.  Never had an AV fistula due to small veins.  Now has L thigh AVG put in Jan 2013, has not clotted to date.    [name] GERD (gastroesophageal reflux disease)    takes Omeprazole daily  . Headache    "at least monthly" (08/30/2012)  . Hepatitis C   . History of blood transfusion    "several" (08/30/2012)  . Hypertension  takes Metoprolol and Lisinopril daily, sees Dr Bea Graff  . Hypothyroidism    takes Synthroid daily  . Migraine    "last migraine was in 2013" (08/30/2012)  . Neuromuscular disorder ([place])    carpal tunnel in right hand  . Obesity   . Peripheral vascular disease (HCC) hands and legs  . Pneumonia    "today and several times before" (08/30/2012)  . 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 SVT (supraventricular tachycardia) ([place]) 06/09/14  . Tobacco abuse   . Type II  diabetes mellitus (HCC)    Levemir 2units daily if > 150    Past Surgical History:  Procedure Laterality Date  . AORTIC VALVE REPLACEMENT N/A 03/15/2013   AVR; Surgeon: Ivin Poot, MD;  Location: [facility] OR; Open Heart Surgery;  26m[employer] mechanical prosthesis, top hat valve  . ARTERIOVENOUS GRAFT PLACEMENT Left 10/03/10    forearm  . AV FISTULA PLACEMENT  01/29/2011   Procedure: INSERTION OF ARTERIOVENOUS (AV) GORE-TEX GRAFT ARM;  Surgeon: CElam Dutch MD;  [facility]  Service: Vascular;  Laterality: Right;  . AV FISTULA PLACEMENT  03/10/2011   Procedure: INSERTION OF ARTERIOVENOUS (AV) GORE-TEX GRAFT THIGH;  Surgeon: CElam Dutch MD;  [place]  Service: Vascular;  Laterality: Left;  . AMartyREMOVAL  12/23/2010   Procedure: REMOVAL OF ARTERIOVENOUS GORETEX GRAFT ([place]as;  Surgeon: CElam Dutch MD;  [place]  Service: Vascular;  Laterality: Left;  procedure started _0 -1852  . CHOLECYSTECTOMY  1993  . CORONARY ARTERY BYPASS GRAFT N/A 03/15/2013   Procedure: CORONARY ARTERY BYPASS GRAFTING (CABG) times one using left internal mammary artery.;  Surgeon: PIvin Poot MD;  [place]  Service: Open Heart Surgery;  Laterality: N/A;  POSS CABG X 1  . CYSTOSCOPY  1990's  . INSERTION OF DIALYSIS CATHETER  12/23/2010   Procedure: INSERTION OF DIALYSIS CATHETER;  Surgeon: CElam Dutch MD;  [place]  Service: Vascular;  Laterality: Right;  Right Internal Jugular 28cm dialysis catheter insertion procedure time 1701-1720   . INTRAOPERATIVE TRANSESOPHAGEAL ECHOCARDIOGRAM N/A 03/15/2013   Procedure: INTRAOPERATIVE TRANSESOPHAGEAL ECHOCARDIOGRAM;  Surgeon: PIvin Poot MD;  [place]  Service: Open Heart Surgery;  Laterality: N/A;  . LEFT HEART CATHETERIZATION WITH CORONARY ANGIOGRAM N/A 07/29/2012   Procedure: LEFT HEART CATHETERIZATION WITH CORONARY ANGIOGRAM;  Surgeon: TTroy Sine MD;  [facility];  Service:  Cardiovascular;  Laterality: N/A;  . LEFT HEART CATHETERIZATION WITH CORONARY ANGIOGRAM N/A 03/10/2013   Procedure: LEFT HEART CATHETERIZATION WITH CORONARY ANGIOGRAM;  Surgeon: TTroy Sine MD;  [facility];  Service: Cardiovascular;  Laterality: N/A;  . LEFT HEART CATHETERIZATION WITH CORONARY/GRAFT ANGIOGRAM N/A 12/24/2011   Procedure: LEFT HEART CATHETERIZATION WITH CBeatrix Fetters  Surgeon: JLorretta Harp MD;  [facility];  Service: Cardiovascular;  Laterality: N/A;  . LEFT HEART CATHETERIZATION WITH CORONARY/GRAFT ANGIOGRAM N/A 12/16/2013   Procedure: LEFT HEART CATHETERIZATION WITH CBeatrix Fetters  Surgeon: TTroy Sine MD;  [facility];  Service: Cardiovascular;  Laterality: N/A;  . LIVER TRANSPLANT  10/25/2009   sees Dr TFerol Luz1 every 6 months, saw last in Dec 2013. DDelynn FlavinCoord [phone] . SHUNTOGRAM Left 05/15/2014   Procedure: SHUNTOGRAM;  Surgeon: BConrad Henderson MD;  Location: [place] CATH LAB;  Service: Cardiovascular;  Laterality: Left;  . SMALL INTESTINE SURGERY  90's  . SPINAL GROWTH RODS  2010   "put 2 metal rods in my back; they had detetriorated" (08/30/2012)  . THROMBECTOMY    . THROMBECTOMY AND REVISION OF ARTERIOVENTOUS (AV) GORETEX  GRAFT Left 03/30/2014  . THROMBECTOMY AND REVISION OF ARTERIOVENTOUS (AV) GORETEX  GRAFT Left 03/30/2014   Procedure: THROMBECTOMY AND REVISION OF ARTERIOVENTOUS (AV) GORETEX  GRAFT;  Surgeon: Conrad Finley, MD;  Location: [place];  Service: Vascular;  Laterality: Left;  . TUBAL LIGATION  1990's     reports that she has been smoking Cigarettes.  She has a 10.00 pack-year smoking history. She has never used smokeless tobacco. She reports that she does not drink alcohol or use drugs.  Allergies  Allergen Reactions  . Acetaminophen Other (See Comments)    Liver transplant recipient   . Codeine Itching  . Mirtazapine Other (See Comments)    hallucination  . Morphine Itching and Other  (See Comments)    hallucinate    Family History  Problem Relation Age of Onset  . Cancer Mother   . Diabetes Mother   . Hypertension Mother   . Stroke Mother   . Cancer Father   . Anesthesia problems Neg Hx   . Hypotension Neg Hx   . Malignant hyperthermia Neg Hx   . Pseudochol deficiency Neg Hx     Prior to Admission medications   Medication Sig Start Date End Date Taking? Authorizing Provider  gabapentin (NEURONTIN) 300 MG capsule Take 300 mg by mouth every evening.   Yes Historical Provider, MD  insulin aspart (NOVOLOG) 100 UNIT/ML injection Inject 1-2 Units into the skin 3 (three) times daily as needed for high blood sugar (CBG>150). CBG < 150 = no insulin CBG 150-200 = 1 unit CBG 200-250 = 2 units   Yes Historical Provider, MD  levothyroxine (SYNTHROID, LEVOTHROID) 150 MCG tablet Take 150 mcg by mouth daily before breakfast.   Yes Historical Provider, MD  lidocaine (LIDODERM) 5 % Place 1 patch onto the skin daily. Remove & Discard patch within 12 hours or as directed by MD   Yes Historical Provider, MD  LORazepam (ATIVAN) 0.5 MG tablet Take 0.5 mg by mouth every 6 (six) hours as needed (for nausea and vomiting).  07/02/15  Yes Historical Provider, MD  metoprolol tartrate (LOPRESSOR) 25 MG tablet Take 12.5 mg by mouth 2 (two) times daily.  03/16/14  Yes Historical Provider, MD  nitroGLYCERIN (NITROSTAT) 0.4 MG SL tablet Place 1 tablet (0.4 mg total) under the tongue every 5 (five) minutes as needed for chest pain. 03/20/14  Yes Silver Huguenin Elgergawy, MD  ondansetron (ZOFRAN ODT) 4 MG disintegrating tablet Take 1 tablet (4 mg total) by mouth every 8 (eight) hours as needed for nausea. 08/13/15  Yes Tanna Furry, MD  oxyCODONE (ROXICODONE) 5 MG immediate release tablet Take 1 tablet (5 mg total) by mouth every 4 (four) hours as needed for severe pain. 08/13/15  Yes Tanna Furry, MD  senna-docusate (SENOKOT-S) 8.6-50 MG tablet Take 1 tablet by mouth daily.   Yes Historical Provider, MD  tacrolimus  (PROGRAF) 1 MG capsule Take 1-2 mg by mouth 2 (two) times daily.    Yes Historical Provider, MD  warfarin (COUMADIN) 2 MG tablet Take 2 mg by mouth daily.   Yes Historical Provider, MD  ipratropium-albuterol (DUONEB) 0.5-2.5 (3) MG/3ML SOLN Take 3 mLs by nebulization every 6 (six) hours as  needed. J44.1 code Patient not taking: Reported on 08/13/2015 08/31/14   Theodis Blaze, MD  ondansetron (ZOFRAN ODT) 4 MG disintegrating tablet Take 1 tablet (4 mg total) by mouth every 8 (eight) hours as needed for nausea or vomiting. Patient not taking: Reported on 10/08/2015 08/15/15   Gregor Hams, MD  traMADol (ULTRAM) 50 MG tablet Take 1 tablet (50 mg total) by mouth every 12 (twelve) hours as needed for moderate pain. Patient not taking: Reported on 08/13/2015 08/30/14   Theodis Blaze, MD  verapamil (CALAN) 120 MG tablet TAKE 1 TABLET (120 MG TOTAL) BY MOUTH AT BEDTIME. NEED OV. Patient not taking: Reported on 08/13/2015 05/22/15   Troy Sine, MD    Physical Exam:   Constitutional: Chronically ill-appearing female with some discomfort, but is AOx3. Vitals:   10/08/15 1914 10/08/15 2000 10/08/15 2030 10/08/15 2130  BP: 150/55 115/64 158/60 164/62  Pulse: 71 78 75 87  Resp: _0 Temp: 98.1 F (36.7 C)     TempSrc: Oral     SpO2: 100% 100% 100% 94%  Weight:      Height:       Eyes: PERRL, lids and conjunctivae normal ENMT: Mucous membranes are moist. Posterior pharynx clear of any exudate or lesions. Neck: normal, supple, no masses, no thyromegaly Respiratory: clear to auscultation bilaterally, no wheezing, no crackles. Normal respiratory effort. No accessory muscle use.  Cardiovascular: Regular rate and rhythm, no murmurs / rubs / gallops. No extremity edema. 1+ pedal pulses. No carotid bruits.  Abdomen: no tenderness, no masses palpated. No hepatosplenomegaly. Bowel sounds positive.  Musculoskeletal: no clubbing / cyanosis. No joint deformity upper and lower extremities. Good ROM, no  contractures. Normal muscle tone.  Skin:  Swelling of the left heel with tenderness to palpation. Darkened discoloration of the  first 3 toes left foot.  Neurologic: CN 2-12 grossly intact.abnormal sensation of b/l lower extremities, DTR normal. Strength 5/5 in all 4.  Psychiatric: Normal judgment and insight. Alert and oriented x 3. Normal mood.     Labs on Admission: I have personally reviewed following labs and imaging studies  CBC:  Recent Labs Lab 10/08/15 1540  WBC 7.9  HGB 10.7*  HCT 32.9*  MCV 90.6  PLT 536*   Basic Metabolic Panel:  Recent Labs Lab 10/08/15 1540  NA 138  K 3.9  CL 101  CO2 28  GLUCOSE 170*  BUN <5*  CREATININE 2.58*  CALCIUM 8.6*   GFR: Estimated Creatinine Clearance: 19.7 mL/min (by C-G formula based on SCr of 2.58 mg/dL). Liver Function Tests: No results for input(s): AST, ALT, ALKPHOS, BILITOT, PROT, ALBUMIN in the last 168 hours. No results for input(s): LIPASE, AMYLASE in the last 168 hours. No results for input(s): AMMONIA in the last 168 hours. Coagulation Profile:  Recent Labs Lab 10/03/15  INR 1.7*   Cardiac Enzymes: No results for input(s): CKTOTAL, CKMB, CKMBINDEX, TROPONINI in the last 168 hours. BNP (last 3 results) No results for input(s): PROBNP in the last 8760 hours. HbA1C: No results for input(s): HGBA1C in the last 72 hours. CBG: No results for input(s): GLUCAP in the last 168 hours. Lipid Profile: No results for input(s): CHOL, HDL, LDLCALC, TRIG, CHOLHDL, LDLDIRECT in the last 72 hours. Thyroid Function Tests: No results for input(s): TSH, T4TOTAL, FREET4, T3FREE, THYROIDAB in the last 72 hours. Anemia Panel: No results for input(s): VITAMINB12, FOLATE, FERRITIN, TIBC, IRON, RETICCTPCT in the last 72 hours. Urine analysis:  Component Value Date/Time   COLORURINE YELLOW 11/02/2012 0428   APPEARANCEUR CLOUDY (A) 11/02/2012 0428   LABSPEC 1.011 11/02/2012 0428   PHURINE 8.0 11/02/2012 0428   GLUCOSEU 250  (A) 11/02/2012 0428   HGBUR LARGE (A) 11/02/2012 0428   BILIRUBINUR NEGATIVE 11/02/2012 0428   KETONESUR NEGATIVE 11/02/2012 0428   PROTEINUR >300 (A) 11/02/2012 0428   UROBILINOGEN 0.2 11/02/2012 0428   NITRITE NEGATIVE 11/02/2012 0428   LEUKOCYTESUR NEGATIVE 11/02/2012 0428   Sepsis Labs: No results found for this or any previous visit (from the past 240 hour(s)).   Radiological Exams on Admission: Dg Foot Complete Left  Result Date: 10/08/2015 CLINICAL DATA:  Left foot pain and swelling from ulcer to heel of foot, history diabetes, numbness and cold sensation darkening in color of toes, EXAM: LEFT FOOT - COMPLETE 3+ VIEW COMPARISON:  None. FINDINGS: No evidence of acute fracture or dislocation in the left foot. No focal bone erosion or cortical destruction to suggest osteomyelitis. Prominent plantar and Achilles spurs of the calcaneus. Mild degenerative changes in the interphalangeal joints and first metatarsal-phalangeal joint. Extensive vascular calcifications. IMPRESSION: No acute bony abnormalities. Electronically Signed   By: Lucienne Capers M.D.   On: 10/08/2015 21:30      Assessment/Plan Cellulitis of foot: Acute swelling of the left foot. Question possibility of acute underlying infection. Initial x-ray showed no acute signs of - Admit to a MedSurg bed - Cellulitis protocol initiated - Empiric antibiotics given immunocompromised status - Check ESR and CRP - Follow-up initial blood cultures - Neurovascular checks every 4 hours  - Question if MRI of the foot is needed at this time   ESRD on HD: Chronic - will need to consult nephrology in am if patient to have a prolonged stay.   Diabetes mellitus type 2 - Continue insulin per home regimen  Essential hypertension  - continue metoprolol   S/p Mechanical aortic valve replacement on chronic anticoagulation therapy / subtherapeutic INR: Initial INR on admission was noted to be 1.2. She had recently been dealing with  bleeding issues, but had been restarted on her coumadin. - Heparin gtt for bridging and Coumadin per pharmacy   Status post liver transplant - continue Prograf  Hypothyroidism - Continue levothyroxine   DVT prophylaxis:  On Coumadin  Code Status: full Family Communication: No family present at bedside Disposition Plan: undetermined Consults called: None Admission status: observation  Norval Morton MD [facility] Pager [phone]  If 7PM-7AM, please contact night-coverage [email or web address] Password [email or web address]  10/08/2015, 10:45 PM

## 2015-10-08 NOTE — ED Notes (Signed)
Pulses found via doppler, marked with pen, 76 bpm

## 2015-10-08 NOTE — ED Triage Notes (Signed)
Pt here to be seen for foot ulcer on L heel. Pt c/o increased pain. Pt is diabetic.

## 2015-10-08 NOTE — ED Provider Notes (Signed)
[facility] DEPT Provider Note   CSN: [id number] Arrival date & time: 10/08/15  1522     History   Chief Complaint Chief Complaint  Patient presents with  . Foot Pain    HPI [name] is a 60 y.o. female.  60 year old female with extensive past medical history including liver transplant, ESRD on HD, CAD, PVD, T2DM, COPD who p/w left foot pain. 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 At that time she began having pain on her left heel and this pain has persisted. Recently, she and family members noted that her foot was swollen and her toes seemed cold. She was instructed to come to the ED for evaluation. The pain is moderate and constant, worse with palpation or walking. No injury that she can recall. No antibiotics for her foot. No fevers, vomiting, or recent illness. Last dialysis was today, patient never misses sessions.   The history is provided by the patient.  Foot Pain     Past Medical History:  Diagnosis Date  . Anemia    takes Folic Acid daily  . Anxiety   . Aortic stenosis   . Arthritis    "left hand, back" (08/30/2012)  . Asthma   . CAD (coronary artery disease)   . CAD (coronary artery disease) Jan. 2015   Cath: 20% LAD, 50% D1; s/p LIMA-LAD  . Chronic back pain   . Chronic bronchitis ([place])    "q yr w/season changes" (08/30/2012)  . Chronic constipation    takes MIralax and Colace daily  . COPD (chronic obstructive pulmonary disease) ([place])   . Depression    takes Cymbalta for "severe" depression  . End stage renal disease on dialysis [facility]) 02/27/2011   Kidneys shut down at time of liver transplant in Sept 2011 at [facility] in [place], she has been on HD ever since.  Dialyzes at [facility] HD on TTS schedule.  Had L forearm graft used 10 months then removed Dec 2012 due to suspected infection.  A right upper arm AVG was placed Dec 2012 but she developed steal symptoms acutely and it was ligated the same day.   Never had an AV fistula due to small veins.  Now has L thigh AVG put in Jan 2013, has not clotted to date.    [name] GERD (gastroesophageal reflux disease)    takes Omeprazole daily  . Headache    "at least monthly" (08/30/2012)  . Hepatitis C   . History of blood transfusion    "several" (08/30/2012)  . Hypertension    takes Metoprolol and Lisinopril daily, sees Dr Bea Graff  . Hypothyroidism    takes Synthroid daily  . Migraine    "last migraine was in 2013" (08/30/2012)  . Neuromuscular disorder ([place])    carpal tunnel in right hand  . Obesity   . Peripheral vascular disease (HCC) hands and legs  . Pneumonia    "today and several times before" (08/30/2012)  . 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 SVT (supraventricular tachycardia) ([place]) 06/09/14  . Tobacco abuse   . Type II diabetes mellitus (HCC)    Levemir 2units daily if > 150    Patient Active Problem List   Diagnosis Date Noted  . Cellulitis of foot 10/08/2015  . H/O unilateral  nephrectomy 07/06/2015  . Paroxysmal SVT (supraventricular tachycardia) ([place]) 06/22/2014  . Complications due to renal dialysis device, implant, and graft ([place]) 05/11/2014  . Arterial hemorrhage 03/31/2014  . Acute blood loss anemia 03/31/2014  . ESRD on dialysis ([place]) 03/31/2014  . Renal dialysis device, implant, or graft complication [id number]  . Bleeding 03/30/2014  . Chest pain 03/19/2014  . Acute hyperkalemia   . Dyspnea 03/07/2014  . Pain in the chest   . S/P liver transplant ([place])   . Chronic hepatitis C without hepatic coma ([place])   . Diastolic CHF, chronic ([place])   . Thyroid activity decreased   . ESRD (end stage renal disease) ([place]) 01/03/2014  . Pulmonary edema 01/03/2014  . Hyperkalemia 01/03/2014  . Respiratory failure ([place]) 01/03/2014  . Evaluate for Sleep apnea 12/31/2013  . DM type 2 (diabetes  mellitus, type 2) ([place]) 12/15/2013  . Unstable angina ([place]) 12/15/2013  . Pleural effusion 07/03/2013  . Paroxysmal supraventricular tachycardia, s/p valve replacement, with chest pain. resolved as outpatient 05/13/2013  . 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 Long term current use of anticoagulant therapy 04/22/2013  . Chronic anticoagulation w/ coumadin, goal INR 2.5-3.0 w/ AVR 04/14/2013  . S/P mechanical AVR Feb 2015 03/15/2013  . Aortic stenosis 03/09/2013  . Palpitations- run of tachycardia after HD, lasted 15-20 minutes 03/08/2013  . Tobacco abuse 02/28/2013  . COPD (chronic obstructive pulmonary disease) ([place]) 02/21/2013  . Hypoxia 02/21/2013  . Chronic combined systolic and diastolic congestive heart failure ([place]) 02/06/2013  . Acute respiratory failure ([place]) 11/02/2012  . Acute on chronic systolic CHF (congestive heart failure) ([place]) 11/02/2012  . COPD exacerbation ([place]) 08/31/2012  . Dyslipidemia-LDL 104, not on statin with Hx of liver transplant 07/30/2012  . Abn EKG- related to LVH, repol changes 07/30/2012  . CAD- s/p LIMA-LAD Feb. 2015 07/27/2012  . Aortic stenosis, moderate-severe at cath Jan 2015. s/p Mechanical AVR 03/2013 07/27/2012  . End stage renal disease on dialysis-TTS 02/27/2011  . Acute hepatitis C virus infection 06/25/2006  . Hypothyroidism 06/25/2006  . DM2 (diabetes mellitus, type 2) ([place]) 06/25/2006  . ANEMIA, CHRONIC 06/25/2006  . HTN- LVH, EF45- 50%. grade 2 diastolic dysf. echo Q000111Q 06/25/2006  . GERD 06/25/2006    Past Surgical History:  Procedure Laterality Date  . AORTIC VALVE REPLACEMENT N/A 03/15/2013   AVR; Surgeon: Ivin Poot, MD;  Location: [facility] OR; Open Heart Surgery;  23mm[employer] mechanical prosthesis, top hat valve  . ARTERIOVENOUS GRAFT PLACEMENT Left 10/03/10    forearm  . AV FISTULA PLACEMENT  01/29/2011   Procedure: INSERTION OF ARTERIOVENOUS (AV) GORE-TEX GRAFT ARM;  Surgeon: Elam Dutch, MD;  Location: [facility] OR;   Service: Vascular;  Laterality: Right;  . AV FISTULA PLACEMENT  03/10/2011   Procedure: INSERTION OF ARTERIOVENOUS (AV) GORE-TEX GRAFT THIGH;  Surgeon: Elam Dutch, MD;  Location: [place] [facility];  Service: Vascular;  Laterality: Left;  . [place] REMOVAL  12/23/2010   Procedure: REMOVAL OF ARTERIOVENOUS GORETEX GRAFT ([place]);  Surgeon: Elam Dutch, MD;  Location: [place];  Service: Vascular;  Laterality: Left;  procedure started @1736 -1852  . CHOLECYSTECTOMY  1993  . CORONARY ARTERY BYPASS GRAFT N/A 03/15/2013   Procedure: CORONARY ARTERY BYPASS GRAFTING (CABG) times one using left internal mammary artery.;  Surgeon: Ivin Poot, MD;  Location: [place];  Service: Open Heart Surgery;  Laterality: N/A;  POSS CABG X 1  . CYSTOSCOPY  1990's  . INSERTION OF DIALYSIS CATHETER  12/23/2010  Procedure: INSERTION OF DIALYSIS CATHETER;  Surgeon: Elam Dutch, MD;  Location: [place];  Service: Vascular;  Laterality: Right;  Right Internal Jugular 28cm dialysis catheter insertion procedure time 1701-1720   . INTRAOPERATIVE TRANSESOPHAGEAL ECHOCARDIOGRAM N/A 03/15/2013   Procedure: INTRAOPERATIVE TRANSESOPHAGEAL ECHOCARDIOGRAM;  Surgeon: Ivin Poot, MD;  Location: [facility];  Service: Open Heart Surgery;  Laterality: N/A;  . LEFT HEART CATHETERIZATION WITH CORONARY ANGIOGRAM N/A 07/29/2012   Procedure: LEFT HEART CATHETERIZATION WITH CORONARY ANGIOGRAM;  Surgeon: Troy Sine, MD;  Location: [facility] CATH LAB;  Service: Cardiovascular;  Laterality: N/A;  . LEFT HEART CATHETERIZATION WITH CORONARY ANGIOGRAM N/A 03/10/2013   Procedure: LEFT HEART CATHETERIZATION WITH CORONARY ANGIOGRAM;  Surgeon: Troy Sine, MD;  Location: [facility] CATH LAB;  Service: Cardiovascular;  Laterality: N/A;  . LEFT HEART CATHETERIZATION WITH CORONARY/GRAFT ANGIOGRAM N/A 12/24/2011   Procedure: LEFT HEART CATHETERIZATION WITH Beatrix Fetters;  Surgeon: [name], MD;  Location: [facility] CATH LAB;  Service: Cardiovascular;  Laterality: N/A;    . LEFT HEART CATHETERIZATION WITH CORONARY/GRAFT ANGIOGRAM N/A 12/16/2013   Procedure: LEFT HEART CATHETERIZATION WITH Beatrix Fetters;  Surgeon: Troy Sine, MD;  Location: [facility] CATH LAB;  Service: Cardiovascular;  Laterality: N/A;  . LIVER TRANSPLANT  10/25/2009   sees Dr Ferol Luz 1 every 6 months, saw last in Dec 2013. [name] Coord [phone]  . SHUNTOGRAM Left 05/15/2014   Procedure: SHUNTOGRAM;  Surgeon: Conrad Tecumseh, MD;  Location: [facility] CATH LAB;  Service: Cardiovascular;  Laterality: Left;  . SMALL INTESTINE SURGERY  90's  . SPINAL GROWTH RODS  2010   "put 2 metal rods in my back; they had detetriorated" (08/30/2012)  . THROMBECTOMY    . THROMBECTOMY AND REVISION OF ARTERIOVENTOUS (AV) GORETEX  GRAFT Left 03/30/2014  . THROMBECTOMY AND REVISION OF ARTERIOVENTOUS (AV) GORETEX  GRAFT Left 03/30/2014   Procedure: THROMBECTOMY AND REVISION OF ARTERIOVENTOUS (AV) GORETEX  GRAFT;  Surgeon: Conrad Ramah, MD;  Location: [facility];  Service: Vascular;  Laterality: Left;  . TUBAL LIGATION  1990's    OB History    No data available       Home Medications    Prior to Admission medications   Medication Sig Start Date End Date Taking? Authorizing Provider  gabapentin (NEURONTIN) 300 MG capsule Take 300 mg by mouth every evening.   Yes Historical Provider, MD  insulin aspart (NOVOLOG) 100 UNIT/ML injection Inject 1-2 Units into the skin 3 (three) times daily as needed for high blood sugar (CBG>150). CBG < 150 = no insulin CBG 150-200 = 1 unit CBG 200-250 = 2 units   Yes Historical Provider, MD  levothyroxine (SYNTHROID, LEVOTHROID) 150 MCG tablet Take 150 mcg by mouth daily before breakfast.   Yes Historical Provider, MD  lidocaine (LIDODERM) 5 % Place 1 patch onto the skin daily. Remove & Discard patch within 12 hours or as directed by MD   Yes Historical Provider, MD  LORazepam (ATIVAN) 0.5 MG tablet Take 0.5 mg by mouth every 6 (six) hours as needed (for nausea and vomiting).   07/02/15  Yes Historical Provider, MD  metoprolol tartrate (LOPRESSOR) 25 MG tablet Take 12.5 mg by mouth 2 (two) times daily.  03/16/14  Yes Historical Provider, MD  nitroGLYCERIN (NITROSTAT) 0.4 MG SL tablet Place 1 tablet (0.4 mg total) under the tongue every 5 (five) minutes as needed for chest pain. 03/20/14  Yes Silver Huguenin Elgergawy, MD  ondansetron (ZOFRAN ODT) 4 MG disintegrating tablet Take 1 tablet (  4 mg total) by mouth every 8 (eight) hours as needed for nausea. 08/13/15  Yes Tanna Furry, MD  oxyCODONE (ROXICODONE) 5 MG immediate release tablet Take 1 tablet (5 mg total) by mouth every 4 (four) hours as needed for severe pain. 08/13/15  Yes Tanna Furry, MD  senna-docusate (SENOKOT-S) 8.6-50 MG tablet Take 1 tablet by mouth daily.   Yes Historical Provider, MD  tacrolimus (PROGRAF) 1 MG capsule Take 1-2 mg by mouth 2 (two) times daily.    Yes Historical Provider, MD  warfarin (COUMADIN) 2 MG tablet Take 2 mg by mouth daily.   Yes Historical Provider, MD  ipratropium-albuterol (DUONEB) 0.5-2.5 (3) MG/3ML SOLN Take 3 mLs by nebulization every 6 (six) hours as needed. J44.1 code Patient not taking: Reported on 08/13/2015 08/31/14   Theodis Blaze, MD  ondansetron (ZOFRAN ODT) 4 MG disintegrating tablet Take 1 tablet (4 mg total) by mouth every 8 (eight) hours as needed for nausea or vomiting. Patient not taking: Reported on 10/08/2015 08/15/15   Gregor Hams, MD  traMADol (ULTRAM) 50 MG tablet Take 1 tablet (50 mg total) by mouth every 12 (twelve) hours as needed for moderate pain. Patient not taking: Reported on 08/13/2015 08/30/14   Theodis Blaze, MD  verapamil (CALAN) 120 MG tablet TAKE 1 TABLET (120 MG TOTAL) BY MOUTH AT BEDTIME. NEED OV. Patient not taking: Reported on 08/13/2015 05/22/15   Troy Sine, MD    Family History Family History  Problem Relation Age of Onset  . Cancer Mother   . Diabetes Mother   . Hypertension Mother   . Stroke Mother   . Cancer Father   . Anesthesia problems Neg Hx     . Hypotension Neg Hx   . Malignant hyperthermia Neg Hx   . Pseudochol deficiency Neg Hx     Social History Social History  Substance Use Topics  . Smoking status: Current Every Day Smoker    Packs/day: 0.25    Years: 40.00    Types: Cigarettes  . Smokeless tobacco: Never Used     Comment: still smokng 1 or 2 a day  . Alcohol use No     Allergies   Acetaminophen; Codeine; Mirtazapine; and Morphine   Review of Systems Review of Systems 10 Systems reviewed and are negative for acute change except as noted in the HPI.   Physical Exam Updated Vital Signs BP 164/62   Pulse 87   Temp 98.1 F (36.7 C) (Oral)   Resp 16   Ht 5' 4.5" (1.638 m)   Wt 120 lb (54.4 kg)   SpO2 94%   BMI 20.28 kg/m   Physical Exam  Constitutional: She is oriented to person, place, and time. She appears well-developed and well-nourished. No distress.  HENT:  Head: Normocephalic and atraumatic.  Moist mucous membranes  Eyes: Conjunctivae are normal. Pupils are equal, round, and reactive to light.  Neck: Neck supple.  Cardiovascular: Normal rate, regular rhythm and normal heart sounds.   No murmur heard. Pulmonary/Chest: Effort normal and breath sounds normal.  Abdominal: Soft. Bowel sounds are normal. She exhibits no distension. There is no tenderness.  Musculoskeletal: She exhibits no edema.  AV fistula in L thigh w/ palpable thrill, no drainage or redness Diffuse swelling of dorsal and plantar L foot w/ TTP heel; small dry ulceration on edge of heel w/ no drainage; no crepitus B/l toes cool to touch Normal sensation BLE  Neurological: She is alert and oriented to person, place,  and time.  Fluent speech  Skin: Skin is warm and dry. No erythema.  Psychiatric: She has a normal mood and affect. Judgment normal.  Nursing note and vitals reviewed.    ED Treatments / Results  Labs (all labs ordered are listed, but only abnormal results are displayed) Labs Reviewed  BASIC METABOLIC PANEL  - Abnormal; Notable for the following:       Result Value   Glucose, Bld 170 (*)    BUN <5 (*)    Creatinine, Ser 2.58 (*)    Calcium 8.6 (*)    GFR calc non Af Amer 19 (*)    GFR calc Af Amer 22 (*)    All other components within normal limits  CBC - Abnormal; Notable for the following:    RBC 3.63 (*)    Hemoglobin 10.7 (*)    HCT 32.9 (*)    RDW 16.0 (*)    Platelets 130 (*)    All other components within normal limits  CULTURE, BLOOD (ROUTINE X 2)  CULTURE, BLOOD (ROUTINE X 2)  CBG MONITORING, ED    EKG  EKG Interpretation None       Radiology Dg Foot Complete Left  Result Date: 10/08/2015 CLINICAL DATA:  Left foot pain and swelling from ulcer to heel of foot, history diabetes, numbness and cold sensation darkening in color of toes, EXAM: LEFT FOOT - COMPLETE 3+ VIEW COMPARISON:  None. FINDINGS: No evidence of acute fracture or dislocation in the left foot. No focal bone erosion or cortical destruction to suggest osteomyelitis. Prominent plantar and Achilles spurs of the calcaneus. Mild degenerative changes in the interphalangeal joints and first metatarsal-phalangeal joint. Extensive vascular calcifications. IMPRESSION: No acute bony abnormalities. Electronically Signed   By: Lucienne Capers M.D.   On: 10/08/2015 21:30    Procedures Procedures (including critical care time)  Medications Ordered in ED Medications  piperacillin-tazobactam (ZOSYN) IVPB 3.375 g (3.375 g Intravenous New Bag/Given 10/08/15 2024)  vancomycin (VANCOCIN) IVPB 1000 mg/200 mL premix (1,000 mg Intravenous New Bag/Given 10/08/15 2025)  oxyCODONE (Oxy IR/ROXICODONE) immediate release tablet 5 mg (5 mg Oral Given 10/08/15 2116)     Initial Impression / Assessment and Plan / ED Course  I have reviewed the triage vital signs and the nursing notes.  Pertinent labs & imaging results that were available during my care of the patient were reviewed by me and considered in my medical decision making (see  chart for details).  Clinical Course   Pt w/ 1 mo of worsening L heel pain and foot swelling. She was nontoxic on exam with reassuring vital signs. She had diffuse swelling of her left foot and tenderness of the heel with a small area of skin breakdown, no active drainage. Able to Doppler pulses in bilateral feet. Labs showed normal WBC, sent cultures due to patient's significant comorbidities although she has had no fevers, abnormal vital signs, or systemic symptoms to suggest sepsis. Plain film of foot is negative for soft tissue gas or evidence of osteomyelitis. I'm concerned about the patient's multiple comorbidities including diabetes, PVD, and immunosuppression as major risk factors for serious foot infection. Gave vanc and zosyn and discussed w/ Dr. Tamala Julian and pt admitted for further care.  Final Clinical Impressions(s) / ED Diagnoses   Final diagnoses:  Cellulitis of left foot    New Prescriptions New Prescriptions   No medications on file     Sharlett Iles, MD 10/08/15 2241

## 2015-10-08 NOTE — ED Notes (Signed)
Pt to xray

## 2015-10-08 NOTE — Telephone Encounter (Signed)
Rec'd phone call from pt's. daughter.  Reported the pt's. left heel and toes of left foot are black.  Stated the pt. is in "severe pain" in left foot.  Reported "the left heel looks like it could bust open any time."  Stated the left heel has a "very red area", and the pt. can't stand to put pressure on it, in walking.  Also stated the pt. can't elevate the left foot, due to the pain.  Reported the foot is "ice cold."  Denied fever/ chills.  It was noted the pt. Had appt. 8/15, and missed that appt. Due to being in hospital.  The daughter is requesting an earlier appt.  Advised that due to the severity of her symptoms, she should take pt. To [place].  Daughter verb. Understanding.

## 2015-10-08 NOTE — Progress Notes (Signed)
Admission note:  Arrival Method: Patient arrived on stretcher from ED Mental Orientation: Alert and oriented x 4 Telemetry: N/A Assessment: Complete, see flowsheets Skin: Diabetic ulcer to patient's left heel. See flowsheets for full assessment IV: Left AC Pain: Patient states pain is 10/10 in left heel  Tubes: Left thigh AV graft  Safety Measures: Bed in lowest position, call light within reach Fall Prevention Safety Plan: Reviewed with patient Admission Screening: Completed 6700 Orientation: Patient has been oriented to the unit, staff and to the room. Orders have been reviewed and implemented. Call light within reach, will continue to monitor the patient closely.  Shelbie Hutching, RN, BSN

## 2015-10-09 DIAGNOSIS — M79609 Pain in unspecified limb: Secondary | ICD-10-CM | POA: Diagnosis not present

## 2015-10-09 DIAGNOSIS — G8929 Other chronic pain: Secondary | ICD-10-CM | POA: Diagnosis present

## 2015-10-09 DIAGNOSIS — B1920 Unspecified viral hepatitis C without hepatic coma: Secondary | ICD-10-CM | POA: Diagnosis present

## 2015-10-09 DIAGNOSIS — I2510 Atherosclerotic heart disease of native coronary artery without angina pectoris: Secondary | ICD-10-CM | POA: Diagnosis present

## 2015-10-09 DIAGNOSIS — F329 Major depressive disorder, single episode, unspecified: Secondary | ICD-10-CM | POA: Diagnosis present

## 2015-10-09 DIAGNOSIS — E1121 Type 2 diabetes mellitus with diabetic nephropathy: Secondary | ICD-10-CM | POA: Diagnosis not present

## 2015-10-09 DIAGNOSIS — E1129 Type 2 diabetes mellitus with other diabetic kidney complication: Secondary | ICD-10-CM | POA: Diagnosis not present

## 2015-10-09 DIAGNOSIS — N2581 Secondary hyperparathyroidism of renal origin: Secondary | ICD-10-CM | POA: Diagnosis present

## 2015-10-09 DIAGNOSIS — E1151 Type 2 diabetes mellitus with diabetic peripheral angiopathy without gangrene: Secondary | ICD-10-CM | POA: Diagnosis present

## 2015-10-09 DIAGNOSIS — Z944 Liver transplant status: Secondary | ICD-10-CM | POA: Diagnosis not present

## 2015-10-09 DIAGNOSIS — T8640 Unspecified complication of liver transplant: Secondary | ICD-10-CM | POA: Diagnosis not present

## 2015-10-09 DIAGNOSIS — E43 Unspecified severe protein-calorie malnutrition: Secondary | ICD-10-CM | POA: Diagnosis present

## 2015-10-09 DIAGNOSIS — N186 End stage renal disease: Secondary | ICD-10-CM | POA: Diagnosis not present

## 2015-10-09 DIAGNOSIS — Z951 Presence of aortocoronary bypass graft: Secondary | ICD-10-CM | POA: Diagnosis not present

## 2015-10-09 DIAGNOSIS — J449 Chronic obstructive pulmonary disease, unspecified: Secondary | ICD-10-CM | POA: Diagnosis present

## 2015-10-09 DIAGNOSIS — E669 Obesity, unspecified: Secondary | ICD-10-CM | POA: Diagnosis present

## 2015-10-09 DIAGNOSIS — Z952 Presence of prosthetic heart valve: Secondary | ICD-10-CM | POA: Diagnosis not present

## 2015-10-09 DIAGNOSIS — E1165 Type 2 diabetes mellitus with hyperglycemia: Secondary | ICD-10-CM | POA: Diagnosis not present

## 2015-10-09 DIAGNOSIS — F17210 Nicotine dependence, cigarettes, uncomplicated: Secondary | ICD-10-CM | POA: Diagnosis present

## 2015-10-09 DIAGNOSIS — D631 Anemia in chronic kidney disease: Secondary | ICD-10-CM | POA: Diagnosis not present

## 2015-10-09 DIAGNOSIS — K5909 Other constipation: Secondary | ICD-10-CM | POA: Diagnosis present

## 2015-10-09 DIAGNOSIS — E038 Other specified hypothyroidism: Secondary | ICD-10-CM | POA: Diagnosis not present

## 2015-10-09 DIAGNOSIS — I5032 Chronic diastolic (congestive) heart failure: Secondary | ICD-10-CM | POA: Diagnosis not present

## 2015-10-09 DIAGNOSIS — E1122 Type 2 diabetes mellitus with diabetic chronic kidney disease: Secondary | ICD-10-CM | POA: Diagnosis present

## 2015-10-09 DIAGNOSIS — E039 Hypothyroidism, unspecified: Secondary | ICD-10-CM | POA: Diagnosis not present

## 2015-10-09 DIAGNOSIS — M549 Dorsalgia, unspecified: Secondary | ICD-10-CM | POA: Diagnosis present

## 2015-10-09 DIAGNOSIS — L03119 Cellulitis of unspecified part of limb: Secondary | ICD-10-CM | POA: Diagnosis not present

## 2015-10-09 DIAGNOSIS — I120 Hypertensive chronic kidney disease with stage 5 chronic kidney disease or end stage renal disease: Secondary | ICD-10-CM | POA: Diagnosis not present

## 2015-10-09 DIAGNOSIS — Z992 Dependence on renal dialysis: Secondary | ICD-10-CM | POA: Diagnosis not present

## 2015-10-09 DIAGNOSIS — L03116 Cellulitis of left lower limb: Secondary | ICD-10-CM | POA: Diagnosis not present

## 2015-10-09 DIAGNOSIS — F419 Anxiety disorder, unspecified: Secondary | ICD-10-CM | POA: Diagnosis present

## 2015-10-09 DIAGNOSIS — Z6820 Body mass index (BMI) 20.0-20.9, adult: Secondary | ICD-10-CM | POA: Diagnosis not present

## 2015-10-09 DIAGNOSIS — K219 Gastro-esophageal reflux disease without esophagitis: Secondary | ICD-10-CM | POA: Diagnosis present

## 2015-10-09 DIAGNOSIS — Z954 Presence of other heart-valve replacement: Secondary | ICD-10-CM | POA: Diagnosis not present

## 2015-10-09 DIAGNOSIS — M7989 Other specified soft tissue disorders: Secondary | ICD-10-CM | POA: Diagnosis present

## 2015-10-09 LAB — GLUCOSE, CAPILLARY
GLUCOSE-CAPILLARY: 112 mg/dL — AB (ref 65–99)
GLUCOSE-CAPILLARY: 110 mg/dL — AB (ref 65–99)
GLUCOSE-CAPILLARY: 200 mg/dL — AB (ref 65–99)
Glucose-Capillary: 106 mg/dL — ABNORMAL HIGH (ref 65–99)

## 2015-10-09 LAB — CBC
HEMATOCRIT: 25.2 % — AB (ref 36.0–46.0)
HEMOGLOBIN: 8.1 g/dL — AB (ref 12.0–15.0)
MCH: 29 pg (ref 26.0–34.0)
MCHC: 32.1 g/dL (ref 30.0–36.0)
MCV: 90.3 fL (ref 78.0–100.0)
Platelets: 138 10*3/uL — ABNORMAL LOW (ref 150–400)
RBC: 2.79 MIL/uL — AB (ref 3.87–5.11)
RDW: 16.4 % — ABNORMAL HIGH (ref 11.5–15.5)
WBC: 8.7 10*3/uL (ref 4.0–10.5)

## 2015-10-09 LAB — MRSA PCR SCREENING: MRSA by PCR: NEGATIVE

## 2015-10-09 LAB — BASIC METABOLIC PANEL
Anion gap: 4 — ABNORMAL LOW (ref 5–15)
BUN: 9 mg/dL (ref 6–20)
CHLORIDE: 105 mmol/L (ref 101–111)
CO2: 30 mmol/L (ref 22–32)
Calcium: 8.3 mg/dL — ABNORMAL LOW (ref 8.9–10.3)
Creatinine, Ser: 3.65 mg/dL — ABNORMAL HIGH (ref 0.44–1.00)
GFR calc Af Amer: 14 mL/min — ABNORMAL LOW (ref >60)
GFR calc non Af Amer: 12 mL/min — ABNORMAL LOW (ref >60)
Glucose, Bld: 117 mg/dL — ABNORMAL HIGH (ref 65–99)
POTASSIUM: 4.2 mmol/L (ref 3.5–5.1)
SODIUM: 139 mmol/L (ref 135–145)

## 2015-10-09 LAB — C-REACTIVE PROTEIN: CRP: 1.1 mg/dL — ABNORMAL HIGH (ref <1.0)

## 2015-10-09 LAB — PROTIME-INR
INR: 1.29
PROTHROMBIN TIME: 16.1 s — AB (ref 11.4–15.2)

## 2015-10-09 LAB — HEPARIN LEVEL (UNFRACTIONATED): HEPARIN UNFRACTIONATED: 0.78 [IU]/mL — AB (ref 0.30–0.70)

## 2015-10-09 LAB — SEDIMENTATION RATE: Sed Rate: 38 mm/hr — ABNORMAL HIGH (ref 0–22)

## 2015-10-09 MED ORDER — INSULIN ASPART 100 UNIT/ML ~~LOC~~ SOLN: 0.0000 [IU] | Freq: Three times a day (TID) | SUBCUTANEOUS | Status: DC

## 2015-10-09 MED ORDER — BOOST / RESOURCE BREEZE PO LIQD
1.0000 | Freq: Three times a day (TID) | ORAL | Status: DC
  Administered 2015-10-09 – 2015-10-16 (×16): 1 via ORAL

## 2015-10-09 MED ORDER — VANCOMYCIN HCL 500 MG IV SOLR
500.0000 mg | INTRAVENOUS | Status: DC
  Filled 2015-10-09: qty 500

## 2015-10-09 MED ORDER — WARFARIN SODIUM 4 MG PO TABS
4.0000 mg | ORAL_TABLET | Freq: Once | ORAL | Status: AC
  Administered 2015-10-09: 4 mg via ORAL
  Filled 2015-10-09 (×2): qty 1

## 2015-10-09 MED ORDER — DEXTROSE 5 % IV SOLN
1.0000 g | Freq: Every day | INTRAVENOUS | Status: DC
  Administered 2015-10-09 (×2): 1 g via INTRAVENOUS
  Filled 2015-10-09 (×3): qty 1

## 2015-10-09 MED ORDER — HEPARIN (PORCINE) IN NACL 100-0.45 UNIT/ML-% IJ SOLN
900.0000 [IU]/h | INTRAMUSCULAR | Status: DC
  Administered 2015-10-09: 900 [IU]/h via INTRAVENOUS
  Administered 2015-10-10 – 2015-10-14 (×4): 800 [IU]/h via INTRAVENOUS
  Administered 2015-10-15: 900 [IU]/h via INTRAVENOUS
  Filled 2015-10-09 (×6): qty 250

## 2015-10-09 MED ORDER — WARFARIN SODIUM 4 MG PO TABS
4.0000 mg | ORAL_TABLET | ORAL | Status: AC
  Administered 2015-10-09: 4 mg via ORAL
  Filled 2015-10-09: qty 1

## 2015-10-09 MED ORDER — INSULIN ASPART 100 UNIT/ML ~~LOC~~ SOLN: 0.0000 [IU] | Freq: Every day | SUBCUTANEOUS | Status: DC

## 2015-10-09 MED ORDER — WARFARIN - PHARMACIST DOSING INPATIENT
Freq: Every day | Status: DC
  Administered 2015-10-10 – 2015-10-14 (×4)

## 2015-10-09 NOTE — Consult Note (Signed)
[place] Nurse wound consult note Reason for Consult: Diabetic Foot Ulcer  Wound type:  NO wounds found on assessment  Patient with a 2 x 2 Allevyn dressing to posterior heel.  Dressing removed.  ZERO wound found.  No drainage, no odor.  Dorsalis Pedis pulse to left foot FAINT.  Patient c/o of extreme pain to heel.  All toes to left foot darker in color without any wound areas.  Foot warm to touch.  Patient stated the pain in the heel started 4 weeks ago when she was hospitalized for a nephrectomy.  Recommendation:  Stat vascular study to r/o blood clots to extremity.  Considering patient history, vascular study to bilat lower extremities may be warranted.  Discussed POC with patient and bedside nurse.  Re consult if needed, will not follow at this time. Thanks Val Riles MSN, RN, CNS-BC, [employer]
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
## 2015-10-09 NOTE — Progress Notes (Addendum)
Inpatient Diabetes Program Recommendations  AACE/ADA: New Consensus Statement on Inpatient Glycemic Control (2015)  Target Ranges:  Prepandial:   less than 140 mg/dL      Peak postprandial:   less than 180 mg/dL (1-2 hours)      Critically ill patients:  140 - 180 mg/dL   Results for ORETA, THORNGREN (MRN [id number]) as of 10/09/2015 10:12  Ref. Range 10/08/2015 23:24 10/09/2015 07:42  Glucose-Capillary Latest Ref Range: 65 - 99 mg/dL 141 (H) 112 (H)   Review of Glycemic Control  Diabetes history: DM2 Outpatient Diabetes medications: Novolog 1-2 units TID with meals as needed for high glucose Current orders for Inpatient glycemic control: Novolog 1-2 units TID with meals PRN  Inpatient Diabetes Program Recommendations:  Correction (SSI): Please discontinue current Novolog 1-2 units PRN. Please use Glycemic Control order set to order Novolog 0-9 units TID with meals and Novolog 0-5 units QHS. HgbA1C: Please consider ordering an A1C to evaluate glycemic control over the past 2-3 months.  NOTE: Noted consult for diabetes coordinator per Diabetic Foot ulcer order set. Chart reviewed.   Thanks, Barnie Alderman, RN, MSN, CDE Diabetes Coordinator Inpatient Diabetes Program [phone] (Team Pager from [place] to [place]) [phone] ([facility] office) [phone] [facility] office) [phone] [facility] office)

## 2015-10-09 NOTE — Progress Notes (Signed)
ANTICOAGULATION/ANTIBIOTIC CONSULT NOTE - Initial Consult  Pharmacy Consult for Coumadin and Vancomycin and Cefepime  Indication: s/p mechanical AVR 03/2013 and L foot cellulitis  Allergies  Allergen Reactions  . Acetaminophen Other (See Comments)    Liver transplant recipient   . Codeine Itching  . Mirtazapine Other (See Comments)    hallucination  . Morphine Itching and Other (See Comments)    hallucinate    Patient Measurements: Height: 5\' 4"  (162.6 cm) Weight: 119 lb 14.9 oz (54.4 kg) IBW/kg (Calculated) : 54.7  Vital Signs: Temp: 97.8 F (36.6 C) (08/29 0950) Temp Source: Oral (08/29 0950) BP: 146/53 (08/29 0950) Pulse Rate: 66 (08/29 0950)  Labs:  Recent Labs  10/08/15 1540 10/08/15 2327 10/09/15 0848  HGB 10.7*  --  8.1*  HCT 32.9*  --  25.2*  PLT 130*  --  138*  LABPROT  --  15.4* 16.1*  INR  --  1.21 1.29  CREATININE 2.58*  --  3.65*    Estimated Creatinine Clearance: 13.9 mL/min (by C-G formula based on SCr of 3.65 mg/dL).   Medical History: Past Medical History:  Diagnosis Date  . Anemia    takes Folic Acid daily  . Anxiety   . Aortic stenosis   . Arthritis    "left hand, back" (08/30/2012)  . Asthma   . CAD (coronary artery disease)   . CAD (coronary artery disease) Jan. 2015   Cath: 20% LAD, 50% D1; s/p LIMA-LAD  . Chronic back pain   . Chronic bronchitis ([place])    "q yr w/season changes" (08/30/2012)  . Chronic constipation    takes MIralax and Colace daily  . COPD (chronic obstructive pulmonary disease) ([place])   . Depression    takes Cymbalta for "severe" depression  . End stage renal disease on dialysis [facility]) 02/27/2011   Kidneys shut down at time of liver transplant in Sept 2011 at [facility] in [place], she has been on HD ever since.  Dialyzes at [facility] HD on TTS schedule.  Had L forearm graft used 10 months then removed Dec 2012 due to suspected infection.  A right upper arm AVG was placed Dec 2012 but she developed steal symptoms  acutely and it was ligated the same day.  Never had an AV fistula due to small veins.  Now has L thigh AVG put in Jan 2013, has not clotted to date.    [name] GERD (gastroesophageal reflux disease)    takes Omeprazole daily  . Headache    "at least monthly" (08/30/2012)  . Hepatitis C   . History of blood transfusion    "several" (08/30/2012)  . Hypertension    takes Metoprolol and Lisinopril daily, sees Dr Bea Graff  . Hypothyroidism    takes Synthroid daily  . Migraine    "last migraine was in 2013" (08/30/2012)  . Neuromuscular disorder ([place])    carpal tunnel in right hand  . Obesity   . Peripheral vascular disease (HCC) hands and legs  . Pneumonia    "today and several times before" (08/30/2012)  . 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 SVT (supraventricular tachycardia) ([place]) 06/09/14  . Tobacco abuse   . Type II diabetes mellitus (HCC)    Levemir 2units daily if > 150    Medications:  Prescriptions Prior to Admission  Medication Sig Dispense Refill Last Dose  . gabapentin (NEURONTIN) 300 MG capsule Take 300 mg by mouth every evening.   10/07/2015 at Unknown time  . insulin aspart (NOVOLOG) 100 UNIT/ML injection Inject 1-2 Units into the skin 3 (three) times daily as needed for high blood sugar (CBG>150). CBG < 150 = no insulin CBG 150-200 = 1 unit CBG 200-250 = 2 units   Past Month at Unknown time  . levothyroxine (SYNTHROID, LEVOTHROID) 150 MCG tablet Take 150 mcg by mouth daily before breakfast.   10/08/2015 at Unknown time  . lidocaine (LIDODERM) 5 % Place 1 patch onto the skin daily. Remove & Discard patch within 12 hours or as directed by MD   Past Week at Unknown time  . LORazepam (ATIVAN) 0.5 MG tablet Take 0.5 mg by mouth every 6 (six) hours as needed (for nausea and vomiting).   0 Past Month at Unknown time  . metoprolol tartrate (LOPRESSOR)  25 MG tablet Take 12.5 mg by mouth 2 (two) times daily.    10/08/2015 at 1100  . nitroGLYCERIN (NITROSTAT) 0.4 MG SL tablet Place 1 tablet (0.4 mg total) under the tongue every 5 (five) minutes as needed for chest pain. 30 tablet 0 unknown at unknown  . ondansetron (ZOFRAN ODT) 4 MG disintegrating tablet Take 1 tablet (4 mg total) by mouth every 8 (eight) hours as needed for nausea. 6 tablet 0 Past Week at Unknown time  . oxyCODONE (ROXICODONE) 5 MG immediate release tablet Take 1 tablet (5 mg total) by mouth every 4 (four) hours as needed for severe pain. 15 tablet 0 10/07/2015 at Unknown time  . senna-docusate (SENOKOT-S) 8.6-50 MG tablet Take 1 tablet by mouth daily.   10/08/2015 at Unknown time  . tacrolimus (PROGRAF) 1 MG capsule Take 1-2 mg by mouth 2 (two) times daily.    10/08/2015 at Unknown time  . warfarin (COUMADIN) 2 MG tablet Take 2 mg by mouth daily.   10/07/2015 at 2200  . ipratropium-albuterol (DUONEB) 0.5-2.5 (3) MG/3ML SOLN Take 3 mLs by nebulization every 6 (six) hours as needed. J44.1 code (Patient not taking: Reported on 08/13/2015) 360 mL 1 Not Taking at Unknown time  . ondansetron (ZOFRAN ODT) 4 MG disintegrating tablet Take 1 tablet (4 mg total) by mouth every 8 (eight) hours as needed for nausea or vomiting. (Patient not taking: Reported on 10/08/2015) 30 tablet 0 Not Taking at Unknown time  . traMADol (ULTRAM) 50 MG tablet Take 1 tablet (50 mg total) by mouth every 12 (twelve) hours as needed for moderate pain. (Patient not taking: Reported on 08/13/2015) 60 tablet 0 Not Taking at Unknown time  . verapamil (CALAN) 120 MG tablet TAKE 1 TABLET (120 MG TOTAL) BY MOUTH AT BEDTIME. NEED OV. (Patient not taking: Reported on 08/13/2015) 15 tablet 0 Not Taking at Unknown time    Assessment: 60 y.o. F presents with fever and L foot pain.  Anticoagulation: Coumadin PTA for h/o mech AVR 03/2013. INR goal 2.5-3 (per o/p AC noted 10/05/15). Admission INR 1.21 (subtherapeutic). Pt starting Flagyl which  will cause increased coumadin effect. Hgb 10.7 (low but stable), plt 130. Home dose: 2mg  daily - last dose 8/27. Pt reports compliance. Last INR at o/p was 1.7 10/03/15 and extra 4mg  given 8/25 then back to 2mg  daily. Patient had some bleeding issues recently however was restarted back on her Coumadin. No bleeding noted currently.   INR today 8/29 is up a little to 1.29 ( INR trend 1.21>1.29)  Need INR 2.5-3 for mechanical valve per o/p AC note 10/05/15. Thus started on IV heparin bridge this AM.  Initial 8 hour heparin level is pending to be drawn now. Hgb decreased from 10.7 to 8.1 and pltc low/stable at 138k.  No bleeding noted.   IGoal of Therapy:  INR 2.5-3 (per o/p AC notes 10/05/15) Monitor platelets by anticoagulation protocol: Yes  Heparin level 0.3-0.7 units/ml  Plan: Continue IV heparin gtt at 900 units/hr and adjust dose if  needed based on heparin level result.  Initial 8 hr heparin level, due to be drawn now. - will f/u Give coumadin 4mg  po today x1 Daily INR, heparin level and CBC.   Thank you for allowing pharmacy to be part of this patients care team. Nicole Cella, RPh Clinical Pharmacist Pager: [phone] 10/09/2015 3:51 PM

## 2015-10-09 NOTE — Progress Notes (Signed)
PROGRESS NOTE    [name]  OMV:[id number] DOB: Jun 23, 1955 DOA: 10/08/2015 PCP: [name], MD   Chief Complaint  Patient presents with  . Foot Pain     Brief Narrative:  HPI on 10/08/15 by Dr. Fuller Plan [name] is a 60 y.o. female with medical history significant of ESRD on HD(M/W/F), DM type 2, s/p Liver transplant, on chronic anticoagulation, CAD, hypothyroidism, PVD, COPD; who presents with complaints of progressively worsening foot pain over the last 4 weeks. Pain is located in the left heel and it is described as sharp and stabbing in nature. Initially, symptoms were intermittent, but have become more constant and severe. Associated symptoms included swelling of the foot, more darkened appearance and discoloration of the toes on the left foot, foot is cold to the touch, and numbness sensation of the toes over the last week or so. Bearing weight on the affected foot worsens symptoms. She takes oxycodone with some relief of symptoms. Patient's daughter called her vascular surgeon's office today and it was recommended for her to come to the hospital for further evaluation. She reports that she's been in and out of hospital recently and is s/p left nephrectomy secondary to a hematoma approximately 4 weeks ago and subsequently s/p splenectomy approximately 2 weeks ago. She reports that she had been restarted on her Coumadin, and her last INR was subtherapeutic at 1.85 days ago.   Assessment & Plan   ?Cellulitis of the left foot -Acute edema, cannot appreciate erythema -Foot xray of the left foot: No acute bony abnormalities -Continue Vancomycin and cefepime -ESR 38, CRP 1.1 -Will obtain LE doppler and ABI  End-stage renal disease -Nephrology made aware patient's admission. -Patient dialyzes on Monday, Wednesday, Friday  Diabetes mellitus, type II -Will order Hemoglobin A1c -Place on ISS with CBG monitoring -Currently patient on ISS PRN  Essential  hypertension -Continue metoprolol  Aortic valve replacement/subtherapeutic INR -INR currently 1.2. -Patient had some bleeding issues recently however was restarted back on her Coumadin -Continue heparin GTT for bridging  Status post liver transplant -Continue Prograf  Hypothyroidism -Continue Synthroid  DVT Prophylaxis  Coumadin/heparin  Code Status: Full  Family Communication: None bedside  Disposition Plan: Admitted for observation. Will obtain a Doppler to rule out DVT and ABI  Consultants None  Procedures  None  Antibiotics   Anti-infectives    Start     Dose/Rate Route Frequency Ordered Stop   10/10/15 1200  vancomycin (VANCOCIN) 500 mg in sodium chloride 0.9 % 100 mL IVPB     500 mg 100 mL/hr over 60 Minutes Intravenous Every M-W-F (Hemodialysis) 10/09/15 0048     10/09/15 0100  ceFEPIme (MAXIPIME) 1 g in dextrose 5 % 50 mL IVPB     1 g 100 mL/hr over 30 Minutes Intravenous Daily at bedtime 10/09/15 0048     10/09/15 0000  metroNIDAZOLE (FLAGYL) IVPB 500 mg     500 mg 100 mL/hr over 60 Minutes Intravenous Every 8 hours 10/08/15 2351     10/08/15 2000  piperacillin-tazobactam (ZOSYN) IVPB 3.375 g     3.375 g 100 mL/hr over 30 Minutes Intravenous  Once 10/08/15 1951 10/08/15 2251   10/08/15 2000  vancomycin (VANCOCIN) IVPB 1000 mg/200 mL premix     1,000 mg 200 mL/hr over 60 Minutes Intravenous  Once 10/08/15 1951 10/08/15 2251      Subjective:   [name] seen and examined today.  Continues complain of left foot pain and tingling. Denies any chest  pain, shortness breath, abdominal pain, nausea or vomiting, diarrhea or constipation. States her leg pain has been ongoing for approximate 4 weeks. She was unable to get in with her primary care physician.  Objective:   Vitals:   10/08/15 2300 10/08/15 2331 10/09/15 0519 10/09/15 0950  BP: 138/68 (!) 161/47 (!) 139/55 (!) 146/53  Pulse: 81 78 72 66  Resp:  14 18 12   Temp:  98.4 F (36.9 C) 98.3 F (36.8  C) 97.8 F (36.6 C)  TempSrc:  Oral Oral Oral  SpO2: 100% 100% 100% 100%  Weight:  54.4 kg (119 lb 14.9 oz)    Height:  5' 4"  (1.626 m)      Intake/Output Summary (Last 24 hours) at 10/09/15 1403 Last data filed at 10/09/15 1304  Gross per 24 hour  Intake              610 ml  Output                0 ml  Net              610 ml   Filed Weights   10/08/15 1534 10/08/15 2331  Weight: 54.4 kg (120 lb) 54.4 kg (119 lb 14.9 oz)    Exam  General: Well developed, well nourished, NAD, appears stated age  HEENT: NCAT,mucous membranes moist.   Cardiovascular: S1 S2 auscultated, RRR  Respiratory: Clear to auscultation bilaterally with equal chest rise  Abdomen: Soft, nontender, nondistended, + bowel sounds  Extremities: warm dry without cyanosis clubbing or edema. Left heel with dry ulceration, no drainage.  Diffuse dorsal edema of left foot. Left thigh AVF   Neuro: AAOx3, nonfocal  Psych: Normal affect and demeanor with intact judgement and insight   Data Reviewed: I have personally reviewed following labs and imaging studies  CBC:  Recent Labs Lab 10/08/15 1540 10/09/15 0848  WBC 7.9 8.7  HGB 10.7* 8.1*  HCT 32.9* 25.2*  MCV 90.6 90.3  PLT 130* 660*   Basic Metabolic Panel:  Recent Labs Lab 10/08/15 1540 10/09/15 0848  NA 138 139  K 3.9 4.2  CL 101 105  CO2 28 30  GLUCOSE 170* 117*  BUN <5* 9  CREATININE 2.58* 3.65*  CALCIUM 8.6* 8.3*   GFR: Estimated Creatinine Clearance: 13.9 mL/min (by C-G formula based on SCr of 3.65 mg/dL). Liver Function Tests: No results for input(s): AST, ALT, ALKPHOS, BILITOT, PROT, ALBUMIN in the last 168 hours. No results for input(s): LIPASE, AMYLASE in the last 168 hours. No results for input(s): AMMONIA in the last 168 hours. Coagulation Profile:  Recent Labs Lab 10/03/15 10/08/15 2327 10/09/15 0848  INR 1.7* 1.21 1.29   Cardiac Enzymes: No results for input(s): CKTOTAL, CKMB, CKMBINDEX, TROPONINI in the last 168  hours. BNP (last 3 results) No results for input(s): PROBNP in the last 8760 hours. HbA1C: No results for input(s): HGBA1C in the last 72 hours. CBG:  Recent Labs Lab 10/08/15 2324 10/09/15 0742 10/09/15 1158  GLUCAP 141* 112* 110*   Lipid Profile: No results for input(s): CHOL, HDL, LDLCALC, TRIG, CHOLHDL, LDLDIRECT in the last 72 hours. Thyroid Function Tests: No results for input(s): TSH, T4TOTAL, FREET4, T3FREE, THYROIDAB in the last 72 hours. Anemia Panel: No results for input(s): VITAMINB12, FOLATE, FERRITIN, TIBC, IRON, RETICCTPCT in the last 72 hours. Urine analysis:    Component Value Date/Time   COLORURINE YELLOW 11/02/2012 0428   APPEARANCEUR CLOUDY (A) 11/02/2012 0428   LABSPEC 1.011 11/02/2012 6301  PHURINE 8.0 11/02/2012 0428   GLUCOSEU 250 (A) 11/02/2012 0428   HGBUR LARGE (A) 11/02/2012 0428   BILIRUBINUR NEGATIVE 11/02/2012 0428   KETONESUR NEGATIVE 11/02/2012 0428   PROTEINUR >300 (A) 11/02/2012 0428   UROBILINOGEN 0.2 11/02/2012 0428   NITRITE NEGATIVE 11/02/2012 0428   LEUKOCYTESUR NEGATIVE 11/02/2012 0428   Sepsis Labs: @LABRCNTIP (procalcitonin:4,lacticidven:4)  ) Recent Results (from the past 240 hour(s))  Culture, blood (routine x 2)     Status: None (Preliminary result)   Collection Time: 10/08/15  8:17 PM  Result Value Ref Range Status   Specimen Description BLOOD RIGHT HAND  Final   Special Requests BOTTLES DRAWN AEROBIC AND ANAEROBIC 5CC EACH  Final   Culture NO GROWTH < 24 HOURS  Final   Report Status PENDING  Incomplete  Culture, blood (routine x 2)     Status: None (Preliminary result)   Collection Time: 10/08/15  8:18 PM  Result Value Ref Range Status   Specimen Description BLOOD LEFT ANTECUBITAL  Final   Special Requests BOTTLES DRAWN AEROBIC AND ANAEROBIC 5CC EACH  Final   Culture NO GROWTH < 24 HOURS  Final   Report Status PENDING  Incomplete  MRSA PCR Screening     Status: None   Collection Time: 10/09/15  1:12 AM  Result  Value Ref Range Status   MRSA by PCR NEGATIVE NEGATIVE Final    Comment:        The GeneXpert MRSA Assay (FDA approved for NASAL specimens only), is one component of a comprehensive MRSA colonization surveillance program. It is not intended to diagnose MRSA infection nor to guide or monitor treatment for MRSA infections.       Radiology Studies: Dg Foot Complete Left  Result Date: 10/08/2015 CLINICAL DATA:  Left foot pain and swelling from ulcer to heel of foot, history diabetes, numbness and cold sensation darkening in color of toes, EXAM: LEFT FOOT - COMPLETE 3+ VIEW COMPARISON:  None. FINDINGS: No evidence of acute fracture or dislocation in the left foot. No focal bone erosion or cortical destruction to suggest osteomyelitis. Prominent plantar and Achilles spurs of the calcaneus. Mild degenerative changes in the interphalangeal joints and first metatarsal-phalangeal joint. Extensive vascular calcifications. IMPRESSION: No acute bony abnormalities. Electronically Signed   By: Lucienne Capers M.D.   On: 10/08/2015 21:30     Scheduled Meds: . ceFEPime (MAXIPIME) IV  1 g Intravenous QHS  . gabapentin  300 mg Oral QPM  . levothyroxine  150 mcg Oral QAC breakfast  . lidocaine  1 patch Transdermal QHS  . metoprolol tartrate  12.5 mg Oral BID  . metronidazole  500 mg Intravenous Q8H  . senna-docusate  1 tablet Oral Daily  . tacrolimus  1 mg Oral QHS  . tacrolimus  2 mg Oral Daily  . [START ON 10/10/2015] vancomycin  500 mg Intravenous Q M,W,F-HD  . Warfarin - Pharmacist Dosing Inpatient   Does not apply q1800   Continuous Infusions: . heparin 900 Units/hr (10/09/15 0740)     LOS: 0 days   Time Spent in minutes   30 minutes  Aivy Akter D.O. on 10/09/2015 at 2:03 PM  Between 7am to 7pm - Pager - [phone]  After 7pm go to [email or web address] - password [email or web address]  And look for the night coverage person covering for me after hours  [facility] Office   [phone]

## 2015-10-09 NOTE — Care Management Obs Status (Signed)
[place] NOTIFICATION   Patient Details  Name: [name] MRN: [id number] Date of Birth: 05-Oct-1955   Medicare Observation Status Notification Given:  Yes    Lavelle Akel, Rory Percy, RN 10/09/2015, 1:46 PM

## 2015-10-09 NOTE — Progress Notes (Addendum)
ANTICOAGULATION/ANTIBIOTIC CONSULT NOTE - Initial Consult  Pharmacy Consult for Coumadin and Vancomycin and Cefepime  Indication: s/p mechanical AVR 03/2013 and L foot cellulitis  Allergies  Allergen Reactions  . Acetaminophen Other (See Comments)    Liver transplant recipient   . Codeine Itching  . Mirtazapine Other (See Comments)    hallucination  . Morphine Itching and Other (See Comments)    hallucinate    Patient Measurements: Height: 5\' 4"  (162.6 cm) Weight: 119 lb 14.9 oz (54.4 kg) IBW/kg (Calculated) : 54.7  Vital Signs: Temp: 98.4 F (36.9 C) (08/28 2331) Temp Source: Oral (08/28 2331) BP: 161/47 (08/28 2331) Pulse Rate: 78 (08/28 2331)  Labs:  Recent Labs  10/08/15 1540 10/08/15 2327  HGB 10.7*  --   HCT 32.9*  --   PLT 130*  --   LABPROT  --  15.4*  INR  --  1.21  CREATININE 2.58*  --     Estimated Creatinine Clearance: 19.7 mL/min (by C-G formula based on SCr of 2.58 mg/dL).   Medical History: Past Medical History:  Diagnosis Date  . Anemia    takes Folic Acid daily  . Anxiety   . Aortic stenosis   . Arthritis    "left hand, back" (08/30/2012)  . Asthma   . CAD (coronary artery disease)   . CAD (coronary artery disease) Jan. 2015   Cath: 20% LAD, 50% D1; s/p LIMA-LAD  . Chronic back pain   . Chronic bronchitis ([place])    "q yr w/season changes" (08/30/2012)  . Chronic constipation    takes MIralax and Colace daily  . COPD (chronic obstructive pulmonary disease) ([place])   . Depression    takes Cymbalta for "severe" depression  . End stage renal disease on dialysis [facility]) 02/27/2011   Kidneys shut down at time of liver transplant in Sept 2011 at [facility] in [place], she has been on HD ever since.  Dialyzes at [facility] HD on TTS schedule.  Had L forearm graft used 10 months then removed Dec 2012 due to suspected infection.  A right upper arm AVG was placed Dec 2012 but she developed steal symptoms acutely and it was ligated the same day.  Never had an  AV fistula due to small veins.  Now has L thigh AVG put in Jan 2013, has not clotted to date.    [name] GERD (gastroesophageal reflux disease)    takes Omeprazole daily  . Headache    "at least monthly" (08/30/2012)  . Hepatitis C   . History of blood transfusion    "several" (08/30/2012)  . Hypertension    takes Metoprolol and Lisinopril daily, sees Dr Bea Graff  . Hypothyroidism    takes Synthroid daily  . Migraine    "last migraine was in 2013" (08/30/2012)  . Neuromuscular disorder ([place])    carpal tunnel in right hand  . Obesity   . Peripheral vascular disease (HCC) hands and legs  . Pneumonia    "today and several times before" (08/30/2012)  . 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 SVT (supraventricular tachycardia) ([place]) 06/09/14  . Tobacco abuse   . Type II diabetes mellitus (HCC)    Levemir 2units daily if > 150    Medications:  Prescriptions Prior to Admission  Medication Sig Dispense Refill Last Dose  .  gabapentin (NEURONTIN) 300 MG capsule Take 300 mg by mouth every evening.   10/07/2015 at Unknown time  . insulin aspart (NOVOLOG) 100 UNIT/ML injection Inject 1-2 Units into the skin 3 (three) times daily as needed for high blood sugar (CBG>150). CBG < 150 = no insulin CBG 150-200 = 1 unit CBG 200-250 = 2 units   Past Month at Unknown time  . levothyroxine (SYNTHROID, LEVOTHROID) 150 MCG tablet Take 150 mcg by mouth daily before breakfast.   10/08/2015 at Unknown time  . lidocaine (LIDODERM) 5 % Place 1 patch onto the skin daily. Remove & Discard patch within 12 hours or as directed by MD   Past Week at Unknown time  . LORazepam (ATIVAN) 0.5 MG tablet Take 0.5 mg by mouth every 6 (six) hours as needed (for nausea and vomiting).   0 Past Month at Unknown time  . metoprolol tartrate (LOPRESSOR) 25 MG tablet Take 12.5 mg by mouth 2 (two) times daily.     10/08/2015 at 1100  . nitroGLYCERIN (NITROSTAT) 0.4 MG SL tablet Place 1 tablet (0.4 mg total) under the tongue every 5 (five) minutes as needed for chest pain. 30 tablet 0 unknown at unknown  . ondansetron (ZOFRAN ODT) 4 MG disintegrating tablet Take 1 tablet (4 mg total) by mouth every 8 (eight) hours as needed for nausea. 6 tablet 0 Past Week at Unknown time  . oxyCODONE (ROXICODONE) 5 MG immediate release tablet Take 1 tablet (5 mg total) by mouth every 4 (four) hours as needed for severe pain. 15 tablet 0 10/07/2015 at Unknown time  . senna-docusate (SENOKOT-S) 8.6-50 MG tablet Take 1 tablet by mouth daily.   10/08/2015 at Unknown time  . tacrolimus (PROGRAF) 1 MG capsule Take 1-2 mg by mouth 2 (two) times daily.    10/08/2015 at Unknown time  . warfarin (COUMADIN) 2 MG tablet Take 2 mg by mouth daily.   10/07/2015 at 2200  . ipratropium-albuterol (DUONEB) 0.5-2.5 (3) MG/3ML SOLN Take 3 mLs by nebulization every 6 (six) hours as needed. J44.1 code (Patient not taking: Reported on 08/13/2015) 360 mL 1 Not Taking at Unknown time  . ondansetron (ZOFRAN ODT) 4 MG disintegrating tablet Take 1 tablet (4 mg total) by mouth every 8 (eight) hours as needed for nausea or vomiting. (Patient not taking: Reported on 10/08/2015) 30 tablet 0 Not Taking at Unknown time  . traMADol (ULTRAM) 50 MG tablet Take 1 tablet (50 mg total) by mouth every 12 (twelve) hours as needed for moderate pain. (Patient not taking: Reported on 08/13/2015) 60 tablet 0 Not Taking at Unknown time  . verapamil (CALAN) 120 MG tablet TAKE 1 TABLET (120 MG TOTAL) BY MOUTH AT BEDTIME. NEED OV. (Patient not taking: Reported on 08/13/2015) 15 tablet 0 Not Taking at Unknown time    Assessment: 60 y.o. F presents with fever and L foot pain.  Anticoagulation: Coumadin PTA for h/o mech AVR 03/2013. INR goal 2.5-3 (per o/p AC noted 10/05/15). Admission INR 1.21 (subtherapeutic). Pt starting Flagyl which will cause increased coumadin effect. Hgb 10.7 (low but  stable), plt 130. Home dose: 2mg  daily - last dose 8/27. Pt reports compliance. Last INR at o/p was 1.7 10/03/15 and extra 4mg  given 8/25 then back to 2mg  daily.  Infectious Disease: Zosyn/Vanc/Flagyl for L foot infection. Afeb currently. WBC wnl. Zosyn 3.375gm IV and Vanc 1gm IV given in ED 8/28 ~2030  8/28 Vanc>> 8/28 Zosyn x 2 8/29 Cefepime>> 8/29 Flagyl>>  MRSA PCR: 8/28 BCx x2:  Nephrology: ESRD - HD (M/W/F as o/p)  Goal of Therapy:  INR 2.5-3 (per o/p AC notes 10/05/15) Monitor platelets by anticoagulation protocol: Yes  Pre-HD vanc level 15-25 mcg/ml   Plan:  Coumadin 4mg  po now Daily INR Vancomycin 500mg  IV M/W/F with HD Cefepime 1gm IV daily Will f/u micro data, renal function, and pt's clinical condition Vanc pre-HD level prn  Sherlon Handing, PharmD, BCPS Clinical pharmacist, pager [phone] 10/09/2015,12:36 AM  Addendum: Since pt with subtherapeutic INR and mechanical valve. To start heparin bridge while INR < 2.5.  Plan: Start heparin gtt at 900 units/hr  Will f/u 8 hr heparin level  Sherlon Handing, PharmD, BCPS Clinical pharmacist, pager [phone] 10/09/2015 6:51 AM

## 2015-10-09 NOTE — Progress Notes (Signed)
ANTICOAGULATION CONSULT NOTE - Follow Up Consult  Pharmacy Consult for Heparin > Coumadin Indication: s/p mechanical AVR Feb 2015  Allergies  Allergen Reactions  . Acetaminophen Other (See Comments)    Liver transplant recipient   . Codeine Itching  . Mirtazapine Other (See Comments)    hallucination  . Morphine Itching and Other (See Comments)    hallucinate    Patient Measurements: Height: 5\' 4"  (162.6 cm) Weight: 119 lb 14.9 oz (54.4 kg) IBW/kg (Calculated) : 54.7  Vital Signs: Temp: 97.7 F (36.5 C) (08/29 1652) Temp Source: Oral (08/29 1652) BP: 154/57 (08/29 1652) Pulse Rate: 64 (08/29 1652)  Labs:  Recent Labs  10/08/15 1540 10/08/15 2327 10/09/15 0848 10/09/15 1909  HGB 10.7*  --  8.1*  --   HCT 32.9*  --  25.2*  --   PLT 130*  --  138*  --   LABPROT  --  15.4* 16.1*  --   INR  --  1.21 1.29  --   HEPARINUNFRC  --   --   --  0.78*  CREATININE 2.58*  --  3.65*  --     Estimated Creatinine Clearance: 13.9 mL/min (by C-G formula based on SCr of 3.65 mg/dL).   Medications:  Scheduled:  . ceFEPime (MAXIPIME) IV  1 g Intravenous QHS  . feeding supplement  1 Container Oral TID BM  . gabapentin  300 mg Oral QPM  . insulin aspart  0-5 Units Subcutaneous QHS  . insulin aspart  0-9 Units Subcutaneous TID WC  . levothyroxine  150 mcg Oral QAC breakfast  . lidocaine  1 patch Transdermal QHS  . metoprolol tartrate  12.5 mg Oral BID  . metronidazole  500 mg Intravenous Q8H  . senna-docusate  1 tablet Oral Daily  . tacrolimus  1 mg Oral QHS  . tacrolimus  2 mg Oral Daily  . [START ON 10/10/2015] vancomycin  500 mg Intravenous Q M,W,F-HD  . warfarin  4 mg Oral ONCE-1800  . Warfarin - Pharmacist Dosing Inpatient   Does not apply q1800   Infusions:  . heparin 900 Units/hr (10/09/15 0740)    Assessment: 60 yo F on Coumadin PTA for hx mechanical AVR.  INR is subtherapeutic and pt is being bridged with heparin therapy.  Heparin level = 0. 78 on 900 units/hr.   No bleeding noted.  Goal of Therapy:  Heparin level 0.3-0.7 units/ml Monitor platelets by anticoagulation protocol: Yes   Plan:  Reduce heparin to 800 units/hr Heparin level with AM labs.  [employer], Pharm.D., BCPS Clinical Pharmacist Pager [phone] 10/09/2015 8:48 PM

## 2015-10-10 ENCOUNTER — Encounter (HOSPITAL_COMMUNITY): Payer: Medicare Other

## 2015-10-10 ENCOUNTER — Inpatient Hospital Stay (HOSPITAL_COMMUNITY): Payer: Medicare Other

## 2015-10-10 DIAGNOSIS — M79609 Pain in unspecified limb: Secondary | ICD-10-CM

## 2015-10-10 DIAGNOSIS — Z944 Liver transplant status: Secondary | ICD-10-CM

## 2015-10-10 DIAGNOSIS — Z954 Presence of other heart-valve replacement: Secondary | ICD-10-CM

## 2015-10-10 DIAGNOSIS — E038 Other specified hypothyroidism: Secondary | ICD-10-CM

## 2015-10-10 DIAGNOSIS — Z992 Dependence on renal dialysis: Secondary | ICD-10-CM

## 2015-10-10 DIAGNOSIS — N186 End stage renal disease: Secondary | ICD-10-CM

## 2015-10-10 LAB — BASIC METABOLIC PANEL
Anion gap: 9 (ref 5–15)
BUN: 16 mg/dL (ref 6–20)
CHLORIDE: 102 mmol/L (ref 101–111)
CO2: 27 mmol/L (ref 22–32)
CREATININE: 4.54 mg/dL — AB (ref 0.44–1.00)
Calcium: 8.4 mg/dL — ABNORMAL LOW (ref 8.9–10.3)
GFR calc non Af Amer: 10 mL/min — ABNORMAL LOW (ref >60)
GFR, EST AFRICAN AMERICAN: 11 mL/min — AB (ref >60)
Glucose, Bld: 133 mg/dL — ABNORMAL HIGH (ref 65–99)
POTASSIUM: 4 mmol/L (ref 3.5–5.1)
Sodium: 138 mmol/L (ref 135–145)

## 2015-10-10 LAB — GLUCOSE, CAPILLARY
GLUCOSE-CAPILLARY: 107 mg/dL — AB (ref 65–99)
GLUCOSE-CAPILLARY: 152 mg/dL — AB (ref 65–99)
Glucose-Capillary: 77 mg/dL (ref 65–99)

## 2015-10-10 LAB — CBC
HEMATOCRIT: 27 % — AB (ref 36.0–46.0)
HEMOGLOBIN: 8.7 g/dL — AB (ref 12.0–15.0)
MCH: 28.9 pg (ref 26.0–34.0)
MCHC: 32.2 g/dL (ref 30.0–36.0)
MCV: 89.7 fL (ref 78.0–100.0)
Platelets: 153 10*3/uL (ref 150–400)
RBC: 3.01 MIL/uL — ABNORMAL LOW (ref 3.87–5.11)
RDW: 16.4 % — ABNORMAL HIGH (ref 11.5–15.5)
WBC: 7.5 10*3/uL (ref 4.0–10.5)

## 2015-10-10 LAB — HEPATITIS B SURFACE ANTIGEN: HEP B S AG: NEGATIVE

## 2015-10-10 LAB — PROTIME-INR
INR: 1.38
Prothrombin Time: 17.1 seconds — ABNORMAL HIGH (ref 11.4–15.2)

## 2015-10-10 LAB — HEPARIN LEVEL (UNFRACTIONATED)
HEPARIN UNFRACTIONATED: 0.55 [IU]/mL (ref 0.30–0.70)
Heparin Unfractionated: 0.54 IU/mL (ref 0.30–0.70)

## 2015-10-10 MED ORDER — LIDOCAINE HCL (PF) 1 % IJ SOLN: 5.0000 mL | INTRAMUSCULAR | Status: DC | PRN

## 2015-10-10 MED ORDER — VANCOMYCIN HCL IN DEXTROSE 500-5 MG/100ML-% IV SOLN
INTRAVENOUS | Status: AC
  Administered 2015-10-10: 500 mg
  Filled 2015-10-10: qty 100

## 2015-10-10 MED ORDER — SODIUM CHLORIDE 0.9 % IV SOLN: 100.0000 mL | INTRAVENOUS | Status: DC | PRN

## 2015-10-10 MED ORDER — HEPARIN SODIUM (PORCINE) 1000 UNIT/ML DIALYSIS: 1000.0000 [IU] | INTRAMUSCULAR | Status: DC | PRN

## 2015-10-10 MED ORDER — DOXYCYCLINE HYCLATE 100 MG PO TABS
100.0000 mg | ORAL_TABLET | Freq: Two times a day (BID) | ORAL | Status: DC
  Filled 2015-10-10: qty 1

## 2015-10-10 MED ORDER — DOXYCYCLINE HYCLATE 100 MG PO TABS
100.0000 mg | ORAL_TABLET | Freq: Two times a day (BID) | ORAL | Status: AC
  Administered 2015-10-10 – 2015-10-14 (×9): 100 mg via ORAL
  Filled 2015-10-10 (×8): qty 1

## 2015-10-10 MED ORDER — LIDOCAINE-PRILOCAINE 2.5-2.5 % EX CREA: 1.0000 "application " | TOPICAL_CREAM | CUTANEOUS | Status: DC | PRN

## 2015-10-10 MED ORDER — WARFARIN SODIUM 4 MG PO TABS
4.0000 mg | ORAL_TABLET | Freq: Once | ORAL | Status: AC
  Administered 2015-10-10: 4 mg via ORAL
  Filled 2015-10-10: qty 1

## 2015-10-10 MED ORDER — PRO-STAT SUGAR FREE PO LIQD
30.0000 mL | Freq: Two times a day (BID) | ORAL | Status: DC
  Administered 2015-10-11 – 2015-10-12 (×2): 30 mL via ORAL
  Filled 2015-10-10 (×10): qty 30

## 2015-10-10 MED ORDER — HEPARIN SODIUM (PORCINE) 1000 UNIT/ML DIALYSIS: 20.0000 [IU]/kg | INTRAMUSCULAR | Status: DC | PRN

## 2015-10-10 MED ORDER — RENA-VITE PO TABS
1.0000 | ORAL_TABLET | Freq: Every day | ORAL | Status: DC
  Administered 2015-10-10 – 2015-10-14 (×5): 1 via ORAL
  Filled 2015-10-10 (×6): qty 1

## 2015-10-10 MED ORDER — PENTAFLUOROPROP-TETRAFLUOROETH EX AERO: 1.0000 "application " | INHALATION_SPRAY | CUTANEOUS | Status: DC | PRN

## 2015-10-10 MED ORDER — ALTEPLASE 2 MG IJ SOLR: 2.0000 mg | Freq: Once | INTRAMUSCULAR | Status: DC | PRN

## 2015-10-10 MED ORDER — DARBEPOETIN ALFA 100 MCG/0.5ML IJ SOSY
100.0000 ug | PREFILLED_SYRINGE | INTRAMUSCULAR | Status: DC
  Administered 2015-10-12: 100 ug via INTRAVENOUS
  Filled 2015-10-10: qty 0.5

## 2015-10-10 MED ORDER — OXYCODONE HCL 5 MG PO TABS
ORAL_TABLET | ORAL | Status: AC
  Filled 2015-10-10: qty 1

## 2015-10-10 NOTE — Consult Note (Signed)
[facility] Renal Consultation Note    Indication for Consultation:  Management of ESRD/hemodialysis; anemia, hypertension/volume and secondary hyperparathyroidism  HPI: [name] is a 60 y.o. female with ESRD on hemodialysis MWF at [facility]. Her medical history includes CAD s/p CABG, aortic stenosis s/p AVR on coumadin, diabetes mellitus, s/p liver transplant 2011 on immunosuppressants. Recent hospitalizations include [facility] 7/13 -09/05/15 s/p splenectomy following splenic infarct. She presented to ED on 8/28 with left foot pain and swelling worsening over the past few weeks. Pain worse with weight bearing and she reported additional symptoms of discoloration and numbness in toes of affected foot. Foot xray showed no bony abnormalities. She was admitted and started on IV vanc/Zosyn for cellulitis. She is seen at bedside and reports that the pain and swelling in her foot has improved since admission. She is still unable to bear weight and it is tender to touch. She denies fever, chills, SOB. She tolerated HD today without any complaints.  Last OP HD was on 8/28 for 3 hours 45 minutes. No treatment issues were noted. She is compliant with HD treatments.    Past Medical History:  Diagnosis Date  . Anemia    takes Folic Acid daily  . Anxiety   . Aortic stenosis   . Arthritis    "left hand, back" (08/30/2012)  . Asthma   . CAD (coronary artery disease)   . CAD (coronary artery disease) Jan. 2015   Cath: 20% LAD, 50% D1; s/p LIMA-LAD  . Chronic back pain   . Chronic bronchitis ([place])    "q yr w/season changes" (08/30/2012)  . Chronic constipation    takes MIralax and Colace daily  . COPD (chronic obstructive pulmonary disease) ([place])   . Depression    takes Cymbalta for "severe" depression  . End stage renal disease on dialysis [facility]) 02/27/2011   Kidneys shut down at time of liver transplant in Sept 2011 at [facility] in [place], she has been on HD ever since.  Dialyzes  at [facility] HD on TTS schedule.  Had L forearm graft used 10 months then removed Dec 2012 due to suspected infection.  A right upper arm AVG was placed Dec 2012 but she developed steal symptoms acutely and it was ligated the same day.  Never had an AV fistula due to small veins.  Now has L thigh AVG put in Jan 2013, has not clotted to date.    [name] GERD (gastroesophageal reflux disease)    takes Omeprazole daily  . Headache    "at least monthly" (08/30/2012)  . Hepatitis C   . History of blood transfusion    "several" (08/30/2012)  . Hypertension    takes Metoprolol and Lisinopril daily, sees Dr Bea Graff  . Hypothyroidism    takes Synthroid daily  . Migraine    "last migraine was in 2013" (08/30/2012)  . Neuromuscular disorder ([place])    carpal tunnel in right hand  . Obesity   . Peripheral vascular disease (HCC) hands and legs  . Pneumonia    "today and several times before" (08/30/2012)  . 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 SVT (supraventricular tachycardia) ([place]) 06/09/14  . Tobacco abuse   . Type II diabetes mellitus (HCC)    Levemir 2units daily if > 150   Past Surgical  History:  Procedure Laterality Date  . AORTIC VALVE REPLACEMENT N/A 03/15/2013   AVR; Surgeon: Ivin Poot, MD;  Location: [facility] OR; Open Heart Surgery;  34mm[employer] mechanical prosthesis, top hat valve  . ARTERIOVENOUS GRAFT PLACEMENT Left 10/03/10    forearm  . AV FISTULA PLACEMENT  01/29/2011   Procedure: INSERTION OF ARTERIOVENOUS (AV) GORE-TEX GRAFT ARM;  Surgeon: Elam Dutch, MD;  Location: [facility] OR;  Service: Vascular;  Laterality: Right;  . AV FISTULA PLACEMENT  03/10/2011   Procedure: INSERTION OF ARTERIOVENOUS (AV) GORE-TEX GRAFT THIGH;  Surgeon: Elam Dutch, MD;  Location: [place];  Service: Vascular;  Laterality: Left;  . [place] REMOVAL  12/23/2010   Procedure:  REMOVAL OF ARTERIOVENOUS GORETEX GRAFT ([place]);  Surgeon: Elam Dutch, MD;  Location: [place];  Service: Vascular;  Laterality: Left;  procedure started @1736 [id number]  . CHOLECYSTECTOMY  1993  . CORONARY ARTERY BYPASS GRAFT N/A 03/15/2013   Procedure: CORONARY ARTERY BYPASS GRAFTING (CABG) times one using left internal mammary artery.;  Surgeon: Ivin Poot, MD;  Location: [place];  Service: Open Heart Surgery;  Laterality: N/A;  POSS CABG X 1  . CYSTOSCOPY  1990's  . INSERTION OF DIALYSIS CATHETER  12/23/2010   Procedure: INSERTION OF DIALYSIS CATHETER;  Surgeon: Elam Dutch, MD;  Location: [place];  Service: Vascular;  Laterality: Right;  Right Internal Jugular 28cm dialysis catheter insertion procedure time 1701-1720   . INTRAOPERATIVE TRANSESOPHAGEAL ECHOCARDIOGRAM N/A 03/15/2013   Procedure: INTRAOPERATIVE TRANSESOPHAGEAL ECHOCARDIOGRAM;  Surgeon: Ivin Poot, MD;  Location: [place];  Service: Open Heart Surgery;  Laterality: N/A;  . LEFT HEART CATHETERIZATION WITH CORONARY ANGIOGRAM N/A 07/29/2012   Procedure: LEFT HEART CATHETERIZATION WITH CORONARY ANGIOGRAM;  Surgeon: Troy Sine, MD;  Location: [facility] CATH LAB;  Service: Cardiovascular;  Laterality: N/A;  . LEFT HEART CATHETERIZATION WITH CORONARY ANGIOGRAM N/A 03/10/2013   Procedure: LEFT HEART CATHETERIZATION WITH CORONARY ANGIOGRAM;  Surgeon: Troy Sine, MD;  Location: [facility] CATH LAB;  Service: Cardiovascular;  Laterality: N/A;  . LEFT HEART CATHETERIZATION WITH CORONARY/GRAFT ANGIOGRAM N/A 12/24/2011   Procedure: LEFT HEART CATHETERIZATION WITH Beatrix Fetters;  Surgeon: [name], MD;  Location: [facility] CATH LAB;  Service: Cardiovascular;  Laterality: N/A;  . LEFT HEART CATHETERIZATION WITH CORONARY/GRAFT ANGIOGRAM N/A 12/16/2013   Procedure: LEFT HEART CATHETERIZATION WITH Beatrix Fetters;  Surgeon: Troy Sine, MD;  Location: [facility] CATH LAB;  Service: Cardiovascular;  Laterality: N/A;  . LIVER TRANSPLANT   10/25/2009   sees Dr Ferol Luz 1 every 6 months, saw last in Dec 2013. [name] Coord [phone]  . SHUNTOGRAM Left 05/15/2014   Procedure: SHUNTOGRAM;  Surgeon: Conrad Wanblee, MD;  Location: [facility] CATH LAB;  Service: Cardiovascular;  Laterality: Left;  . SMALL INTESTINE SURGERY  90's  . SPINAL GROWTH RODS  2010   "put 2 metal rods in my back; they had detetriorated" (08/30/2012)  . THROMBECTOMY    . THROMBECTOMY AND REVISION OF ARTERIOVENTOUS (AV) GORETEX  GRAFT Left 03/30/2014  . THROMBECTOMY AND REVISION OF ARTERIOVENTOUS (AV) GORETEX  GRAFT Left 03/30/2014   Procedure: THROMBECTOMY AND REVISION OF ARTERIOVENTOUS (AV) GORETEX  GRAFT;  Surgeon: Conrad Corson, MD;  Location: [place];  Service: Vascular;  Laterality: Left;  . TUBAL LIGATION  1990's   Family History  Problem Relation Age of Onset  . Cancer Mother   . Diabetes Mother   . Hypertension Mother   . Stroke Mother   . Cancer Father   .  Anesthesia problems Neg Hx   . Hypotension Neg Hx   . Malignant hyperthermia Neg Hx   . Pseudochol deficiency Neg Hx    Social History:  reports that she has been smoking Cigarettes.  She has a 10.00 pack-year smoking history. She has never used smokeless tobacco. She reports that she does not drink alcohol or use drugs. Allergies  Allergen Reactions  . Acetaminophen Other (See Comments)    Liver transplant recipient   . Codeine Itching  . Mirtazapine Other (See Comments)    hallucination  . Morphine Itching and Other (See Comments)    hallucinate   Prior to Admission medications   Medication Sig Start Date End Date Taking? Authorizing Provider  gabapentin (NEURONTIN) 300 MG capsule Take 300 mg by mouth every evening.   Yes Historical Provider, MD  insulin aspart (NOVOLOG) 100 UNIT/ML injection Inject 1-2 Units into the skin 3 (three) times daily as needed for high blood sugar (CBG>150). CBG < 150 = no insulin CBG 150-200 = 1 unit CBG 200-250 = 2 units   Yes Historical Provider, MD   levothyroxine (SYNTHROID, LEVOTHROID) 150 MCG tablet Take 150 mcg by mouth daily before breakfast.   Yes Historical Provider, MD  lidocaine (LIDODERM) 5 % Place 1 patch onto the skin daily. Remove & Discard patch within 12 hours or as directed by MD   Yes Historical Provider, MD  LORazepam (ATIVAN) 0.5 MG tablet Take 0.5 mg by mouth every 6 (six) hours as needed (for nausea and vomiting).  07/02/15  Yes Historical Provider, MD  metoprolol tartrate (LOPRESSOR) 25 MG tablet Take 12.5 mg by mouth 2 (two) times daily.  03/16/14  Yes Historical Provider, MD  nitroGLYCERIN (NITROSTAT) 0.4 MG SL tablet Place 1 tablet (0.4 mg total) under the tongue every 5 (five) minutes as needed for chest pain. 03/20/14  Yes Silver Huguenin Elgergawy, MD  ondansetron (ZOFRAN ODT) 4 MG disintegrating tablet Take 1 tablet (4 mg total) by mouth every 8 (eight) hours as needed for nausea. 08/13/15  Yes Tanna Furry, MD  oxyCODONE (ROXICODONE) 5 MG immediate release tablet Take 1 tablet (5 mg total) by mouth every 4 (four) hours as needed for severe pain. 08/13/15  Yes Tanna Furry, MD  senna-docusate (SENOKOT-S) 8.6-50 MG tablet Take 1 tablet by mouth daily.   Yes Historical Provider, MD  tacrolimus (PROGRAF) 1 MG capsule Take 1-2 mg by mouth 2 (two) times daily.    Yes Historical Provider, MD  warfarin (COUMADIN) 2 MG tablet Take 2 mg by mouth daily.   Yes Historical Provider, MD  ipratropium-albuterol (DUONEB) 0.5-2.5 (3) MG/3ML SOLN Take 3 mLs by nebulization every 6 (six) hours as needed. J44.1 code Patient not taking: Reported on 08/13/2015 08/31/14   Theodis Blaze, MD  ondansetron (ZOFRAN ODT) 4 MG disintegrating tablet Take 1 tablet (4 mg total) by mouth every 8 (eight) hours as needed for nausea or vomiting. Patient not taking: Reported on 10/08/2015 08/15/15   Gregor Hams, MD  traMADol (ULTRAM) 50 MG tablet Take 1 tablet (50 mg total) by mouth every 12 (twelve) hours as needed for moderate pain. Patient not taking: Reported on 08/13/2015  08/30/14   Theodis Blaze, MD  verapamil (CALAN) 120 MG tablet TAKE 1 TABLET (120 MG TOTAL) BY MOUTH AT BEDTIME. NEED OV. Patient not taking: Reported on 08/13/2015 05/22/15   Troy Sine, MD   Current Facility-Administered Medications  Medication Dose Route Frequency Provider Last Rate Last Dose  . albuterol (PROVENTIL) (  2.5 MG/3ML) 0.083% nebulizer solution 2.5 mg  2.5 mg Nebulization Q2H PRN Rondell A Tamala Julian, MD      . doxycycline (VIBRA-TABS) tablet 100 mg  100 mg Oral Q12H David Tat, MD      . feeding supplement (BOOST / RESOURCE BREEZE) liquid 1 Container  1 Container Oral TID BM Maryann Mikhail, DO   1 Container at 10/10/15 1400  . gabapentin (NEURONTIN) capsule 300 mg  300 mg Oral QPM Norval Morton, MD   300 mg at 10/09/15 1801  . heparin ADULT infusion 100 units/mL (25000 units/262mL sodium chloride 0.45%)  800 Units/hr Intravenous Continuous Kimberly B Hammons, RPH 8 mL/hr at 10/10/15 1139 800 Units/hr at 10/10/15 1139  . levothyroxine (SYNTHROID, LEVOTHROID) tablet 150 mcg  150 mcg Oral QAC breakfast Norval Morton, MD   150 mcg at 10/10/15 1139  . lidocaine (LIDODERM) 5 % 1 patch  1 patch Transdermal QHS Norval Morton, MD   1 patch at 10/09/15 0012  . LORazepam (ATIVAN) tablet 0.5 mg  0.5 mg Oral Q6H PRN Norval Morton, MD      . metoprolol tartrate (LOPRESSOR) tablet 12.5 mg  12.5 mg Oral BID Norval Morton, MD   12.5 mg at 10/10/15 1146  . nitroGLYCERIN (NITROSTAT) SL tablet 0.4 mg  0.4 mg Sublingual Q5 min PRN Norval Morton, MD      . ondansetron (ZOFRAN) tablet 4 mg  4 mg Oral Q6H PRN Norval Morton, MD       Or  . ondansetron (ZOFRAN) injection 4 mg  4 mg Intravenous Q6H PRN Norval Morton, MD   4 mg at 10/09/15 1331  . oxyCODONE (Oxy IR/ROXICODONE) immediate release tablet 5-10 mg  5-10 mg Oral Q4H PRN Norval Morton, MD   5 mg at 10/10/15 0814  . senna-docusate (Senokot-S) tablet 1 tablet  1 tablet Oral Daily Norval Morton, MD   1 tablet at 10/10/15 1139  .  tacrolimus (PROGRAF) capsule 1 mg  1 mg Oral QHS Norval Morton, MD   1 mg at 10/09/15 2205  . tacrolimus (PROGRAF) capsule 2 mg  2 mg Oral Daily Norval Morton, MD   2 mg at 10/10/15 1138  . warfarin (COUMADIN) tablet 4 mg  4 mg Oral ONCE-1800 Orson Eva, MD      . Warfarin - Pharmacist Dosing Inpatient   Does not apply [place], [facility]       Labs: Basic Metabolic Panel:  Recent Labs Lab 10/08/15 1540 10/09/15 0848 10/10/15 0451  NA 138 139 138  K 3.9 4.2 4.0  CL 101 105 102  CO2 28 30 27   GLUCOSE 170* 117* 133*  BUN <5* 9 16  CREATININE 2.58* 3.65* 4.54*  CALCIUM 8.6* 8.3* 8.4*   Liver Function Tests: No results for input(s): AST, ALT, ALKPHOS, BILITOT, PROT, ALBUMIN in the last 168 hours. No results for input(s): LIPASE, AMYLASE in the last 168 hours. No results for input(s): AMMONIA in the last 168 hours. CBC:  Recent Labs Lab 10/08/15 1540 10/09/15 0848 10/10/15 0451  WBC 7.9 8.7 7.5  HGB 10.7* 8.1* 8.7*  HCT 32.9* 25.2* 27.0*  MCV 90.6 90.3 89.7  PLT 130* 138* 153   Cardiac Enzymes: No results for input(s): CKTOTAL, CKMB, CKMBINDEX, TROPONINI in the last 168 hours. CBG:  Recent Labs Lab 10/09/15 0742 10/09/15 1158 10/09/15 1653 10/09/15 2057 10/10/15 1142  GLUCAP 112* 110* 106* 200* 77   Iron Studies: No  results for input(s): IRON, TIBC, TRANSFERRIN, FERRITIN in the last 72 hours. Studies/Results: Dg Foot Complete Left  Result Date: 10/08/2015 CLINICAL DATA:  Left foot pain and swelling from ulcer to heel of foot, history diabetes, numbness and cold sensation darkening in color of toes, EXAM: LEFT FOOT - COMPLETE 3+ VIEW COMPARISON:  None. FINDINGS: No evidence of acute fracture or dislocation in the left foot. No focal bone erosion or cortical destruction to suggest osteomyelitis. Prominent plantar and Achilles spurs of the calcaneus. Mild degenerative changes in the interphalangeal joints and first metatarsal-phalangeal joint. Extensive  vascular calcifications. IMPRESSION: No acute bony abnormalities. Electronically Signed   By: Lucienne Capers M.D.   On: 10/08/2015 21:30    ROS: As per HPI otherwise negative.  Physical Exam: Vitals:   10/10/15 0926 10/10/15 0959 10/10/15 1021 10/10/15 1146  BP: (!) 87/53 (!) 114/53 (!) 124/49 (!) 109/93  Pulse: 79 71 69 66  Resp:   18   Temp:   98.2 F (36.8 C)   TempSrc:   Oral   SpO2:   100%   Weight:   54.1 kg (119 lb 4.3 oz)   Height:         General: WDWN NAD Head: NCAT sclera not icteric MMM Neck: Supple.  Lungs: CTA bilaterally without wheezes, rales, or rhonchi. Breathing is unlabored. Heart: RRR S1 S2  Abdomen: soft NT + BS Lower extremities: diffuse swelling to dorsal surface of left foot, no  drainage; heel tender to palpation; no lower leg edema  Neuro: A & O  X 3. Moves all extremities spontaneously. Psych:  Responds to questions appropriately with a normal affect. Dialysis Access: L thigh AV graft + thrill   Dialysis Orders:  [place] MWF 3:45 180 F 450/A 1.5x  3K/2.25 Ca Na model: linear EDW 51.5 kg LLE AVG No Heparin  Mircera 50 mcg IV q 4 weeks (last dose 8/2)  OP Labs: Hgb 9.9 K 3.7 Ca 8.8 P 4.4 iPTH 44 Alb 2.0   Assessment/Plan: 1.  Left foot cellulitis - per primary on IV abx - with some improvement LE doppler and ABI pending  2.  ESRD -  MWF HD today on schedule K+ 4.0 3.  Hypertension/volume  - BP stable OP Bps 100-130s post HD HD today net UF 1303 mL post weight 54.1 kg - unusually heavy for her OP pre weights usually only 1-2 kg over EDW - question weight since she is having trouble standing on foot  4.  Anemia  - Hgb 8.7 - due for next ESA dose - will place order for 9/1 5.  Metabolic bone disease -  Ca 8.4 - calcitriol held at OP center for high corr Ca  OP Phos 4.4 - no binders  6.  Nutrition - Alb 2.0 - renal diet/ fluid restriction vitamins add protein supplement  7. S/p mechanical aortic valve replacement - per primary on chronic  anticoagulation therapy 8. Diabetes mellitus type 2 - per primary  9. S/p Liver transplant - on prograf   Ogechi Larina Earthly  PA-C [facility] 10/10/2015, 2:21 PM   Renal Attending:  I agree with note above as articulated by Ms Lorine Bears. There appears to be broken skin on heel with blood oozing c/w an entry site for cellulitis.  Will do HD today. Timaya Bojarski C

## 2015-10-10 NOTE — Progress Notes (Signed)
Preliminary results by tech - Venous Duplex Lower Ext. Completed. Negative for deep and superficial vein thrombosis in both legs.  Hendrik Donath, BS, RDMS, RVT  

## 2015-10-10 NOTE — Progress Notes (Addendum)
VASCULAR LAB PRELIMINARY  ARTERIAL  ABI completed: Right sided ABI is suggestive of Mild arterial insufficiency at rest. Left sided ABI  Was non-compressible. Left TBI was not obtainable due to flattened waveforms.     RIGHT    LEFT    PRESSURE WAVEFORM  PRESSURE WAVEFORM  BRACHIAL 158 Triphasic BRACHIAL IV Triphasic  DP 146  DP Non-com Bi/mono  PT 143  PT Non-com Bi/mono  PER   PER    GREAT TOE 110 NA GREAT TOE 0 NA    RIGHT LEFT  ABI 0.92 Non-comp    Rita Sturdivant, RVT

## 2015-10-10 NOTE — Progress Notes (Signed)
Report to Rudie Meyer, Dialysis RN.  Patient alert and oriented in NAD on transport.  Patient denies any needs or concerns at this time.  Eyeglasses and cell phone with patient to HD.  Heparin infusing at 800 units/hr.  Next lab draw at noon.

## 2015-10-10 NOTE — Progress Notes (Signed)
PROGRESS NOTE  [name] OMB:[id number] DOB: 06-02-1955 DOA: 10/08/2015 PCP: [name], MD  Brief History:  HPI on 10/08/15 by Dr. [name] [name] medical history significant of ESRD on HD(M/W/F), DM type 2, s/p Liver transplant,on chronic anticoagulation, CAD, hypothyroidism, PVD, COPD; who presents with complaints of progressively worsening foot pain over the last 4 weeks. Pain is located in the left heel and it is described assharp and stabbing in nature. Initially, symptoms were intermittent,but have become more constant and severe. Associated symptoms included swelling of the foot, more darkened appearance and discoloration of the toes on the left foot, foot is cold to the touch, and numbness sensation of the toes over the last week or so. Bearing weight on the affected foot worsens symptoms. She takes oxycodone with some relief of symptoms. Patient's daughter called her vascular surgeon's office 8/28 and it was recommended for her to come to the hospital for further evaluation. 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  Previous medical records reviewed and summarized  Assessment/Plan: Left foot pain--tip of calcaneus -mild calcaneal edema, cannot appreciate erythema -Foot xray of the left foot: No acute bony abnormalities, No foreign bodies  -Discontinue all intravenous antibiotics  -Suspect the patient has a resolving soft tissue injury as indicated by some ecchymosis on her calcaneus and tiny area of skin injury  -ESR 38, CRP 1.1 -Follow up lower extremity duplex  End-stage renal disease -Appreciate Nephrology -Patient dialyzes on Monday, Wednesday, Friday  Diabetes mellitus, type II -08/23/2015 hemoglobin A1c 4.2 -Discontinue CBGs, insulin sliding scale    Essential hypertension -Continue metoprolol tartrate  Aortic valve replacement/subtherapeutic INR -Patient had some bleeding issues recently however was restarted back on her Coumadin -Continue heparin GTT for bridging -unable to use lovenox due to ESRD  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  Greater than 50% spent face to face counseling and coordinating care.   Disposition Plan:  Home  When INR therapeutic  Consultants Neprology  Procedures  None  Antibiotics: Vanco/cefepime/flagyl 8/29>>>8/30    Subjective:  patient continues to complain of sharp knifelike pain on the tip of her left calcaneus. She denies any recent injury but feels that she may have hit her left foot against the bed when she was at [facility]. She stated that this pain began around that time. She is able to bear weight with some pain. She denies fevers, chills, chest discomfort, shortness breath, nausea, vomiting, diarrhea. No abdominal pain. She denies any recent drainage from her left heel.  Objective: Vitals:   10/10/15 0926 10/10/15 0959 10/10/15 1021 10/10/15 1146  BP: (!) 87/53 (!) 114/53 (!) 124/49 (!) 109/93  Pulse: 79 71 69 66  Resp:   18   Temp:   98.2 F (36.8 C)   TempSrc:   Oral   SpO2:   100%   Weight:   54.1 kg (119 lb 4.3 oz)   Height:        Intake/Output Summary (Last 24 hours) at 10/10/15 1335 Last data filed at 10/10/15 1245  Gross per 24 hour  Intake  1028.59 ml  Output             1303 ml  Net          -274.41 ml   Weight change: 0.068 kg (2.4 oz) Exam:   General:  Pt is alert, follows commands appropriately, not in acute distress  HEENT: No icterus, No thrush, No neck mass, /AT  Cardiovascular: RRR, S1/S2, no rubs, no  gallops  Respiratory: CTA bilaterally, no wheezing, no crackles, no rhonchi  Abdomen: Soft/+BS, non tender, non distended, no guarding  Extremities: No edema, No lymphangitis, No petechiae, No rashes, no synovitis; mild ecchymosis at the left calcaneus with a small area of skin breakdown at the site. No purulent drainage, no erythema, no necrosis, no crepitance   Data Reviewed: I have personally reviewed following labs and imaging studies Basic Metabolic Panel:  Recent Labs Lab 10/08/15 1540 10/09/15 0848 10/10/15 0451  NA 138 139 138  K 3.9 4.2 4.0  CL 101 105 102  CO2 28 30 27   GLUCOSE 170* 117* 133*  BUN <5* 9 16  CREATININE 2.58* 3.65* 4.54*  CALCIUM 8.6* 8.3* 8.4*   Liver Function Tests: No results for input(s): AST, ALT, ALKPHOS, BILITOT, PROT, ALBUMIN in the last 168 hours. No results for input(s): LIPASE, AMYLASE in the last 168 hours. No results for input(s): AMMONIA in the last 168 hours. Coagulation Profile:  Recent Labs Lab 10/08/15 2327 10/09/15 0848 10/10/15 0451  INR 1.21 1.29 1.38   CBC:  Recent Labs Lab 10/08/15 1540 10/09/15 0848 10/10/15 0451  WBC 7.9 8.7 7.5  HGB 10.7* 8.1* 8.7*  HCT 32.9* 25.2* 27.0*  MCV 90.6 90.3 89.7  PLT 130* 138* 153   Cardiac Enzymes: No results for input(s): CKTOTAL, CKMB, CKMBINDEX, TROPONINI in the last 168 hours. BNP: Invalid input(s): POCBNP CBG:  Recent Labs Lab 10/09/15 0742 10/09/15 1158 10/09/15 1653 10/09/15 2057 10/10/15 1142  GLUCAP 112* 110* 106* 200* 77   HbA1C: No results for input(s): HGBA1C in the last 72 hours. Urine analysis:    Component Value Date/Time   COLORURINE YELLOW 11/02/2012 0428   APPEARANCEUR CLOUDY (A) 11/02/2012 0428   LABSPEC 1.011 11/02/2012 0428   PHURINE 8.0 11/02/2012 0428   GLUCOSEU 250 (A) 11/02/2012 0428   HGBUR LARGE (A) 11/02/2012 0428   BILIRUBINUR NEGATIVE 11/02/2012 0428   KETONESUR NEGATIVE 11/02/2012 0428   PROTEINUR >300 (A) 11/02/2012 0428    UROBILINOGEN 0.2 11/02/2012 0428   NITRITE NEGATIVE 11/02/2012 0428   LEUKOCYTESUR NEGATIVE 11/02/2012 0428   Sepsis Labs: @LABRCNTIP (procalcitonin:4,lacticidven:4) ) Recent Results (from the past 240 hour(s))  Culture, blood (routine x 2)     Status: None (Preliminary result)   Collection Time: 10/08/15  8:17 PM  Result Value Ref Range Status   Specimen Description BLOOD RIGHT HAND  Final   Special Requests BOTTLES DRAWN AEROBIC AND ANAEROBIC 5CC EACH  Final   Culture NO GROWTH < 24 HOURS  Final   Report Status PENDING  Incomplete  Culture, blood (routine x 2)     Status: None (Preliminary result)   Collection Time: 10/08/15  8:18 PM  Result Value Ref Range Status   Specimen Description BLOOD LEFT ANTECUBITAL  Final   Special Requests BOTTLES DRAWN AEROBIC AND ANAEROBIC 5CC EACH  Final   Culture NO GROWTH < 24 HOURS  Final   Report Status PENDING  Incomplete  MRSA PCR Screening     Status: None   Collection Time: 10/09/15  1:12 AM  Result Value  Ref Range Status   MRSA by PCR NEGATIVE NEGATIVE Final    Comment:        The GeneXpert MRSA Assay (FDA approved for NASAL specimens only), is one component of a comprehensive MRSA colonization surveillance program. It is not intended to diagnose MRSA infection nor to guide or monitor treatment for MRSA infections.   Aerobic Culture (superficial specimen)     Status: None (Preliminary result)   Collection Time: 10/09/15  2:53 PM  Result Value Ref Range Status   Specimen Description DRAINAGE HEEL  Final   Special Requests NONE  Final   Gram Stain   Final    FEW WBC PRESENT, PREDOMINANTLY PMN FEW GRAM POSITIVE COCCI IN PAIRS    Culture CULTURE REINCUBATED FOR BETTER GROWTH  Final   Report Status PENDING  Incomplete     Scheduled Meds: . feeding supplement  1 Container Oral TID BM  . gabapentin  300 mg Oral QPM  . insulin aspart  0-5 Units Subcutaneous QHS  . insulin aspart  0-9 Units Subcutaneous TID WC  . levothyroxine   150 mcg Oral QAC breakfast  . lidocaine  1 patch Transdermal QHS  . metoprolol tartrate  12.5 mg Oral BID  . senna-docusate  1 tablet Oral Daily  . tacrolimus  1 mg Oral QHS  . tacrolimus  2 mg Oral Daily  . Warfarin - Pharmacist Dosing Inpatient   Does not apply q1800   Continuous Infusions: . heparin 800 Units/hr (10/10/15 1139)    Procedures/Studies: Dg Foot Complete Left  Result Date: 10/08/2015 CLINICAL DATA:  Left foot pain and swelling from ulcer to heel of foot, history diabetes, numbness and cold sensation darkening in color of toes, EXAM: LEFT FOOT - COMPLETE 3+ VIEW COMPARISON:  None. FINDINGS: No evidence of acute fracture or dislocation in the left foot. No focal bone erosion or cortical destruction to suggest osteomyelitis. Prominent plantar and Achilles spurs of the calcaneus. Mild degenerative changes in the interphalangeal joints and first metatarsal-phalangeal joint. Extensive vascular calcifications. IMPRESSION: No acute bony abnormalities. Electronically Signed   By: Lucienne Capers M.D.   On: 10/08/2015 21:30    Leaira Fullam, DO  [facility] Pager [phone]  If 7PM-7AM, please contact night-coverage [email or web address] Password [email or web address] 10/10/2015, 1:35 PM   LOS: 1 day

## 2015-10-10 NOTE — Procedures (Signed)
Tolerating hemodialysis, goal 3000, some hypotension corrected with trendelenburg. [name] C

## 2015-10-10 NOTE — Progress Notes (Addendum)
ANTICOAGULATION CONSULT NOTE - Follow Up Consult  Pharmacy Consult for Heparin  Indication: h/o  AVR mechanical (on coumadin PTA)  Allergies  Allergen Reactions  . Acetaminophen Other (See Comments)    Liver transplant recipient   . Codeine Itching  . Mirtazapine Other (See Comments)    hallucination  . Morphine Itching and Other (See Comments)    hallucinate    Patient Measurements: Height: 5\' 4"  (162.6 cm) Weight: 119 lb 4.3 oz (54.1 kg) (Standing scale) IBW/kg (Calculated) : 54.7  Vital Signs: Temp: 98.2 F (36.8 C) (08/30 1021) Temp Source: Oral (08/30 1021) BP: 109/93 (08/30 1146) Pulse Rate: 66 (08/30 1146)  Labs:  Recent Labs  10/08/15 1540 10/08/15 2327 10/09/15 0848 10/09/15 1909 10/10/15 0451 10/10/15 0455 10/10/15 1217  HGB 10.7*  --  8.1*  --  8.7*  --   --   HCT 32.9*  --  25.2*  --  27.0*  --   --   PLT 130*  --  138*  --  153  --   --   LABPROT  --  15.4* 16.1*  --  17.1*  --   --   INR  --  1.21 1.29  --  1.38  --   --   HEPARINUNFRC  --   --   --  0.78*  --  0.55 0.54  CREATININE 2.58*  --  3.65*  --  4.54*  --   --     Estimated Creatinine Clearance: 11.1 mL/min (by C-G formula based on SCr of 4.54 mg/dL).     Assessment: 60 y.o female, on a IV Heparin infusion while INR is subtherapeutic. Goal INR is 2.5-3.0 for h/o mechanical AVR.  [facility] office visit notes goal INR 2.5-3.0 and that patient bleeds easily after dialysis. Today the heparin level is therapeutic x 2 on current rate of IV heparin infusion 800 units/hr.  INR 1.38, increased some but remains subtherapeutic after 2 days of 4mg  coumadin.  No bleeding reported. Hgb is low at 8.7 but up from 8.1 yesterday and pltc up to 153K. She is receiving Flagyl which may increase coumadin effect.   Flagyl, vancomycin and cefepime were just discontined by Dr. Carles Collet and narrowed to oral doxycycline.  Goal of Therapy:  Heparin level 0.3-0.7 units/ml Monitor platelets by anticoagulation protocol:  Yes   Plan:  Continue IV heparin 800 units/hr Coumadin 4mg  po today x1 Dialy Heparin level, INR, CBC   Nicole Cella, RPh Clinical Pharmacist Pager: [phone] 10/10/2015,1:44 PM

## 2015-10-10 NOTE — Progress Notes (Signed)
ANTICOAGULATION CONSULT NOTE - Follow Up Consult  Pharmacy Consult for Heparin  Indication: AVR  Allergies  Allergen Reactions  . Acetaminophen Other (See Comments)    Liver transplant recipient   . Codeine Itching  . Mirtazapine Other (See Comments)    hallucination  . Morphine Itching and Other (See Comments)    hallucinate    Patient Measurements: Height: 5\' 4"  (162.6 cm) Weight: 120 lb 2.4 oz (54.5 kg) IBW/kg (Calculated) : 54.7  Vital Signs: Temp: 99.3 F (37.4 C) (08/29 2102) Temp Source: Oral (08/29 2102) BP: 134/39 (08/29 2102) Pulse Rate: 62 (08/29 2102)  Labs:  Recent Labs  10/08/15 1540 10/08/15 2327 10/09/15 0848 10/09/15 1909 10/10/15 0451 10/10/15 0455  HGB 10.7*  --  8.1*  --  8.7*  --   HCT 32.9*  --  25.2*  --  27.0*  --   PLT 130*  --  138*  --  153  --   LABPROT  --  15.4* 16.1*  --  17.1*  --   INR  --  1.21 1.29  --  1.38  --   HEPARINUNFRC  --   --   --  0.78*  --  0.55  CREATININE 2.58*  --  3.65*  --  4.54*  --     Estimated Creatinine Clearance: 11.2 mL/min (by C-G formula based on SCr of 4.54 mg/dL).  Assessment: Heparin while INR is sub-therapeutic, heparin level therapeutic x 1 after rate decrease  Goal of Therapy:  Heparin level 0.3-0.7 units/ml Monitor platelets by anticoagulation protocol: Yes   Plan:  -Cont heparin 800 units/hr -1200 HL  Tephanie Escorcia 10/10/2015,5:36 AM

## 2015-10-11 DIAGNOSIS — Z992 Dependence on renal dialysis: Secondary | ICD-10-CM | POA: Diagnosis not present

## 2015-10-11 DIAGNOSIS — N186 End stage renal disease: Secondary | ICD-10-CM | POA: Diagnosis not present

## 2015-10-11 DIAGNOSIS — T8640 Unspecified complication of liver transplant: Secondary | ICD-10-CM | POA: Diagnosis not present

## 2015-10-11 LAB — HEMOGLOBIN A1C
Hgb A1c MFr Bld: 5 % (ref 4.8–5.6)
MEAN PLASMA GLUCOSE: 97 mg/dL

## 2015-10-11 LAB — CBC
HCT: 25.6 % — ABNORMAL LOW (ref 36.0–46.0)
Hemoglobin: 8.4 g/dL — ABNORMAL LOW (ref 12.0–15.0)
MCH: 28.9 pg (ref 26.0–34.0)
MCHC: 32.8 g/dL (ref 30.0–36.0)
MCV: 88 fL (ref 78.0–100.0)
PLATELETS: 130 10*3/uL — AB (ref 150–400)
RBC: 2.91 MIL/uL — ABNORMAL LOW (ref 3.87–5.11)
RDW: 16 % — AB (ref 11.5–15.5)
WBC: 7.8 10*3/uL (ref 4.0–10.5)

## 2015-10-11 LAB — GLUCOSE, CAPILLARY
GLUCOSE-CAPILLARY: 87 mg/dL (ref 65–99)
GLUCOSE-CAPILLARY: 141 mg/dL — AB (ref 65–99)
Glucose-Capillary: 136 mg/dL — ABNORMAL HIGH (ref 65–99)
Glucose-Capillary: 180 mg/dL — ABNORMAL HIGH (ref 65–99)

## 2015-10-11 LAB — HEPARIN LEVEL (UNFRACTIONATED): HEPARIN UNFRACTIONATED: 0.43 [IU]/mL (ref 0.30–0.70)

## 2015-10-11 LAB — PROTIME-INR
INR: 1.6
PROTHROMBIN TIME: 19.2 s — AB (ref 11.4–15.2)

## 2015-10-11 MED ORDER — WARFARIN SODIUM 2 MG PO TABS
2.0000 mg | ORAL_TABLET | Freq: Once | ORAL | Status: AC
  Administered 2015-10-11: 2 mg via ORAL
  Filled 2015-10-11: qty 1

## 2015-10-11 NOTE — Progress Notes (Signed)
ANTICOAGULATION CONSULT NOTE - Follow Up Consult  Pharmacy Consult for Heparin  Indication: h/o  AVR mechanical (on coumadin PTA)  Allergies  Allergen Reactions  . Acetaminophen Other (See Comments)    Liver transplant recipient   . Codeine Itching  . Mirtazapine Other (See Comments)    hallucination  . Morphine Itching and Other (See Comments)    hallucinate    Patient Measurements: Height: 5\' 4"  (162.6 cm) Weight: 119 lb 4.3 oz (54.1 kg) (Standing scale) IBW/kg (Calculated) : 54.7  Vital Signs: Temp: 97.4 F (36.3 C) (08/31 0911) Temp Source: Oral (08/31 0911) BP: 113/56 (08/31 0911) Pulse Rate: 60 (08/31 0911)  Labs:  Recent Labs  10/09/15 0848  10/10/15 0451 10/10/15 0455 10/10/15 1217 10/11/15 0332  HGB 8.1*  --  8.7*  --   --  8.4*  HCT 25.2*  --  27.0*  --   --  25.6*  PLT 138*  --  153  --   --  130*  LABPROT 16.1*  --  17.1*  --   --  19.2*  INR 1.29  --  1.38  --   --  1.60  HEPARINUNFRC  --   < >  --  0.55 0.54 0.43  CREATININE 3.65*  --  4.54*  --   --   --   < > = values in this interval not displayed.  Estimated Creatinine Clearance: 11.1 mL/min (by C-G formula based on SCr of 4.54 mg/dL).     Assessment: 60 y.o female, on a IV Heparin infusion while INR is subtherapeutic. Goal INR is 2.5-3.0 for h/o mechanical AVR.  [place] office visit notes goal INR 2.5-3.0 and notes that patient bleeds easily after dialysis.  LE doppler neg for DVT  Heparin level = 0.43 on 800 units/hr IV heparin drip.  INR = 1.6.  Increased from 1.38, after 3 days of 4mg  coumadin. Goal INR 2.5-3.0  No bleeding reported. CBC is low /stableat 8.7 b Antibiotics narrowed on 10/10/15 to oral doxycycline-may increased hypothrombinemic effect of coumadin.  Goal of Therapy:  Heparin level 0.3-0.7 units/ml Monitor platelets by anticoagulation protocol: Yes  INR 2.5-3.0   Plan:  Continue IV heparin 800 units/hr Coumadin 2mg  po today x1 Dialy Heparin level, INR, CBC   Nicole Cella, RPh Clinical Pharmacist Pager: [phone] 10/11/2015,4:10 PM

## 2015-10-11 NOTE — Care Management Note (Signed)
Case Management Note  Patient Details  Name: PRAYER FREDRICKS MRN: [id number] Date of Birth: 08/21/1955  Subjective/Objective:     CM following for progression and d/c planning.                Action/Plan: 10/11/2015 This CM notified by [facility] that this pt is active with that agency for [facility], [place] and [place] services.    Expected Discharge Date:                  Expected Discharge Plan:  [place]  In-House Referral:  NA  Discharge planning Services  CM Consult  Post Acute Care Choice:  Home Health Choice offered to:  Patient  DME Arranged:    DME Agency:     HH Arranged:  RN, PT, OT HH Agency:  [facility]  Status of Service:  In process, will continue to follow  If discussed at Long Length of Stay Meetings, dates discussed:    Additional Comments:  Adron Bene, RN 10/11/2015, 11:26 AM

## 2015-10-11 NOTE — Progress Notes (Signed)
[name] Progress Note  Assessment/Plan: 1.  Left foot cellulitis - per primary on IV abx - with some improvement - LE doppler neg for DVT 2.  ESRD -  MWF HD - cont on schedule K+ 4.0 - will place orders for tomorrow  3.  Hypertension/volume  - BP stable OP Bps 100-130s post HD HD yesterday net UF 1303 mL post weight 54.1 kg - unusually heavy for her OP pre weights usually only 1-2 kg over EDW - question weight since she is having trouble standing on foot  4.  Anemia  - Hgb 8.7 - due for next ESA dose - will place order for 9/1 5.  Metabolic bone disease -  Ca 8.4 - calcitriol held at OP center for high corr Ca  OP Phos 4.4 - no binders  6.  Nutrition - Alb 2.0 - renal diet/ fluid restriction vitamins add protein supplement  7. S/p mechanical aortic valve replacement - per primary on chronic anticoagulation therapy - INR subtherapeutic on admission 8. Diabetes mellitus type 2 - per primary  9. S/p Liver transplant - on prograf  [name], PA-C [place] 10/11/2015,9:38 AM  LOS: 2 days   Subjective:   Feeling much better today. Foot pain has improved. Hasn't tried to walk on it. Appetite improving.   Objective Vitals:   10/10/15 1743 10/10/15 2005 10/11/15 0536 10/11/15 0911  BP: (!) 131/42 (!) 169/43 (!) 144/49 (!) 113/56  Pulse: 63 63 65 60  Resp: 18 18 16 16   Temp: 97.5 F (36.4 C) 99.5 F (37.5 C) 99.5 F (37.5 C) 97.4 F (36.3 C)  TempSrc: Oral Oral Oral Oral  SpO2: 100% 100% 100% 100%  Weight:      Height:       Physical Exam General: Frail appearing female NAD Heart: RRR S1 S2 Lungs: CTAB Abdomen: soft NT ND Extremities: swelling to dorsal surface of left foot improved from 8/30 exam no  drainage; heel tender to palpation; no lower leg edema  Dialysis Access: L thigh AVG +thrill   Dialysis Orders: [facility] MWF 3:45 180 F 450/A 1.5x  3K/2.25 Ca Na model: linear EDW 51.5 kg LLE AVG No Heparin  Mircera 50 mcg IV q 4  weeks (last dose 8/2)  OP Labs: Hgb 9.9 K 3.7 Ca 8.8 P 4.4 iPTH 44 Alb 2.0   Additional Objective Labs: Basic Metabolic Panel:  Recent Labs Lab 10/08/15 1540 10/09/15 0848 10/10/15 0451  NA 138 139 138  K 3.9 4.2 4.0  CL 101 105 102  CO2 28 30 27   GLUCOSE 170* 117* 133*  BUN <5* 9 16  CREATININE 2.58* 3.65* 4.54*  CALCIUM 8.6* 8.3* 8.4*   Liver Function Tests: No results for input(s): AST, ALT, ALKPHOS, BILITOT, PROT, ALBUMIN in the last 168 hours. No results for input(s): LIPASE, AMYLASE in the last 168 hours. CBC:  Recent Labs Lab 10/08/15 1540 10/09/15 0848 10/10/15 0451 10/11/15 0332  WBC 7.9 8.7 7.5 7.8  HGB 10.7* 8.1* 8.7* 8.4*  HCT 32.9* 25.2* 27.0* 25.6*  MCV 90.6 90.3 89.7 88.0  PLT 130* 138* 153 130*   Blood Culture    Component Value Date/Time   SDES DRAINAGE HEEL 10/09/2015 1453   SPECREQUEST NONE 10/09/2015 1453   CULT CULTURE REINCUBATED FOR BETTER GROWTH 10/09/2015 1453   REPTSTATUS PENDING 10/09/2015 1453    Cardiac Enzymes: No results for input(s): CKTOTAL, CKMB, CKMBINDEX, TROPONINI in the last 168 hours. CBG:  Recent Labs Lab 10/09/15  2057 10/10/15 1142 10/10/15 1649 10/10/15 2128 10/11/15 0827  GLUCAP 200* 77 107* 152* 87   Iron Studies: No results for input(s): IRON, TIBC, TRANSFERRIN, FERRITIN in the last 72 hours. Lab Results  Component Value Date   INR 1.60 10/11/2015   INR 1.38 10/10/2015   INR 1.29 10/09/2015   Studies/Results: No results found. Medications: . heparin 800 Units/hr (10/10/15 1139)   . [START ON 10/12/2015] darbepoetin (ARANESP) injection - DIALYSIS  100 mcg Intravenous Q Fri-HD  . doxycycline  100 mg Oral Q12H  . feeding supplement  1 Container Oral TID BM  . feeding supplement (PRO-STAT SUGAR FREE 64)  30 mL Oral BID  . gabapentin  300 mg Oral QPM  . levothyroxine  150 mcg Oral QAC breakfast  . lidocaine  1 patch Transdermal QHS  . metoprolol tartrate  12.5 mg Oral BID  . multivitamin  1 tablet  Oral QHS  . senna-docusate  1 tablet Oral Daily  . tacrolimus  1 mg Oral QHS  . tacrolimus  2 mg Oral Daily  . Warfarin - Pharmacist Dosing Inpatient   Does not apply q1800    Renal Attending: I agree with note above by Ms. Ogechi. Her heel is improving on antibiotics and we will plan for HD in AM, Friday. Ronell Boldin C

## 2015-10-11 NOTE — Consult Note (Signed)
   [facility] Inpatient Consult   10/11/2015  [name] 10/06/1955 [id number]  Patient was assessed for [facility] for community services. Patient was previously active with [facility]. Per her History and Physical in medical record the patient is a 60 y.o. female with ESRD on hemodialysis MWF at [facility]. Her medical history includes CAD s/p CABG, aortic stenosis s/p AVR on coumadin, diabetes mellitus, s/p liver transplant 2011 on immunosuppressants. Recent hospitalizations include [facility] 7/13 -09/05/15 s/p splenectomy following splenic infarct. She presented to ED on 8/28 with left foot pain and swelling worsening over the past few weeks. Pain worse with weight bearing and she reported additional symptoms of discoloration and numbness in toes of affected foot. Foot xray showed no bony abnormalities. She was admitted and started on IV vanc/Zosyn for cellulitis Met with patient at bedside regarding being restarted with [facility] services. Patient endorses [name] Maes MD as her primary care provider. Consent form - a signed copy on file and a brochure with [facility] information given.  Of note, [facility] Care Management services does not replace or interfere with any services that are arranged by inpatient case management or social work. For additional questions or referrals please contact:  Natividad Brood, RN BSN [facility] Liaison  [phone] business mobile phone Toll free office [phone]

## 2015-10-11 NOTE — Progress Notes (Addendum)
PROGRESS NOTE  [name] ZRA:[id number] DOB: 12-30-1955 DOA: 10/08/2015 PCP: [name], MD  Brief History:  HPI on 10/08/15 by Dr. [name] [name] medical history significant of ESRD on HD(M/W/F), DM type 2, s/p Liver transplant,on chronic anticoagulation, CAD, hypothyroidism, PVD, COPD; who presents with complaints of progressively worsening foot pain over the last 4 weeks. Pain is located in the left heel and it is described assharp and stabbing in nature. Initially, symptoms were intermittent,but have become more constant and severe. Associated symptoms included swelling of the foot, more darkened appearance and discoloration of the toes on the left foot, foot is cold to the touch, and numbness sensation of the toes over the last week. Bearing weight on the affected foot worsens symptoms. She takes oxycodone with some relief of symptoms. Patient's daughter called her vascular surgeon's office 8/28 and it was recommended for her to come to the hospital for further evaluation. 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 The patient presented with nausea vomiting and abdominal pain. CT of the abdomen and pelvis at that time revealed spike infarct with ring-enhancing collection of fluid.  Previous medical records reviewed and summarized  Assessment/Plan: Left foot pain--tip of calcaneus -mild calcaneal edema, cannot appreciate erythema or drainage -Foot xray of the left foot: No acute bony abnormalities, No foreign bodies  -Discontinue all intravenous antibiotics  -Suspect the patient has a resolving soft tissue injury as indicated by some ecchymosis on her calcaneus and tiny area of healing skin injury  -ESR 38, CRP 1.1 -lower extremity duplex--neg for DVT -Review of the notes  from [facility] reveals that she has had chronic feet pain predating her splenectomy--they recommended pain management consultation -ABI on LLE--noncompressible  End-stage renal disease -Appreciate Nephrology -Patient dialyzes on Monday, Wednesday, Friday  Diabetes mellitus, type II -08/23/2015 hemoglobin A1c 4.2 -Discontinue CBGs, insulin sliding scale  -10/10/15 A1C--5.0  Essential hypertension -Continue metoprolol tartrate -BP has been labile  Aortic valve replacement (mechanical)/subtherapeutic INR -Patient had some bleeding issues recently however was restarted back on her Coumadin -Continue heparin GTT for bridging -unable to use lovenox due to ESRD  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 Denies any fevers, chills, chest pain, shortness breath, nausea, vomiting, diarrhea, abdominal pain. No dysuria or hematuria. No rashes.  Objective: Vitals:   10/10/15 1743 10/10/15 2005 10/11/15 0536 10/11/15 0911  BP: (!) 131/42 (!) 169/43 (!) 144/49 (!) 113/56  Pulse: 63 63 65 60  Resp: 18 18 16 16   Temp: 97.5 F (36.4 C) 99.5 F (37.5 C) 99.5 F (37.5 C) 97.4 F (36.3 C)  TempSrc: Oral Oral Oral Oral  SpO2: 100% 100% 100% 100%  Weight:      Height:        Intake/Output Summary (Last 24 hours) at 10/11/15 1538 Last data filed at 10/11/15 1350  Gross per 24 hour  Intake           909.33 ml  Output  0 ml  Net           909.33 ml   Weight change: -0.4 kg (-14.1 oz) Exam:   General:  Pt is alert, follows commands appropriately, not in  acute distress  HEENT: No icterus, No thrush, No neck mass, [place]/AT  Cardiovascular: RRR, S1/S2, no rubs, no gallops  Respiratory: CTA bilaterally, no wheezing, no crackles, no rhonchi  Abdomen: Soft/+BS, non tender, non distended, no guarding  Extremities: No edema, No lymphangitis, No petechiae, No rashes, no synovitis;  mild ecchymosis at the left calcaneus with a small area of skin breakdown at the site. No purulent drainage, no erythema, no necrosis, no crepitance   Data Reviewed: I have personally reviewed following labs and imaging studies Basic Metabolic Panel:  Recent Labs Lab 10/08/15 1540 10/09/15 0848 10/10/15 0451  NA 138 139 138  K 3.9 4.2 4.0  CL 101 105 102  CO2 28 30 27   GLUCOSE 170* 117* 133*  BUN <5* 9 16  CREATININE 2.58* 3.65* 4.54*  CALCIUM 8.6* 8.3* 8.4*   Liver Function Tests: No results for input(s): AST, ALT, ALKPHOS, BILITOT, PROT, ALBUMIN in the last 168 hours. No results for input(s): LIPASE, AMYLASE in the last 168 hours. No results for input(s): AMMONIA in the last 168 hours. Coagulation Profile:  Recent Labs Lab 10/08/15 2327 10/09/15 0848 10/10/15 0451 10/11/15 0332  INR 1.21 1.29 1.38 1.60   CBC:  Recent Labs Lab 10/08/15 1540 10/09/15 0848 10/10/15 0451 10/11/15 0332  WBC 7.9 8.7 7.5 7.8  HGB 10.7* 8.1* 8.7* 8.4*  HCT 32.9* 25.2* 27.0* 25.6*  MCV 90.6 90.3 89.7 88.0  PLT 130* 138* 153 130*   Cardiac Enzymes: No results for input(s): CKTOTAL, CKMB, CKMBINDEX, TROPONINI in the last 168 hours. BNP: Invalid input(s): POCBNP CBG:  Recent Labs Lab 10/10/15 1142 10/10/15 1649 10/10/15 2128 10/11/15 0827 10/11/15 1229  GLUCAP 77 107* 152* 87 141*   HbA1C:  Recent Labs  10/10/15 0455  HGBA1C 5.0   Urine analysis:    Component Value Date/Time   COLORURINE YELLOW 11/02/2012 0428   APPEARANCEUR CLOUDY (A) 11/02/2012 0428   LABSPEC 1.011 11/02/2012 0428   PHURINE 8.0 11/02/2012 0428   GLUCOSEU 250 (A)  11/02/2012 0428   HGBUR LARGE (A) 11/02/2012 0428   BILIRUBINUR NEGATIVE 11/02/2012 0428   KETONESUR NEGATIVE 11/02/2012 0428   PROTEINUR >300 (A) 11/02/2012 0428   UROBILINOGEN 0.2 11/02/2012 0428   NITRITE NEGATIVE 11/02/2012 0428   LEUKOCYTESUR NEGATIVE 11/02/2012 0428   Sepsis Labs: @LABRCNTIP (procalcitonin:4,lacticidven:4) ) Recent Results (from the past 240 hour(s))  Culture, blood (routine x 2)     Status: None (Preliminary result)   Collection Time: 10/08/15  8:17 PM  Result Value Ref Range Status   Specimen Description BLOOD RIGHT HAND  Final   Special Requests BOTTLES DRAWN AEROBIC AND ANAEROBIC 5CC EACH  Final   Culture NO GROWTH 3 DAYS  Final   Report Status PENDING  Incomplete  Culture, blood (routine x 2)     Status: None (Preliminary result)   Collection Time: 10/08/15  8:18 PM  Result Value Ref Range Status   Specimen Description BLOOD LEFT ANTECUBITAL  Final   Special Requests BOTTLES DRAWN AEROBIC AND ANAEROBIC 5CC EACH  Final   Culture NO GROWTH 3 DAYS  Final   Report Status PENDING  Incomplete  MRSA PCR Screening     Status: None   Collection Time: 10/09/15  1:12 AM  Result Value Ref Range Status  MRSA by PCR NEGATIVE NEGATIVE Final    Comment:        The GeneXpert MRSA Assay (FDA approved for NASAL specimens only), is one component of a comprehensive MRSA colonization surveillance program. It is not intended to diagnose MRSA infection nor to guide or monitor treatment for MRSA infections.   Aerobic Culture (superficial specimen)     Status: None (Preliminary result)   Collection Time: 10/09/15  2:53 PM  Result Value Ref Range Status   Specimen Description DRAINAGE HEEL  Final   Special Requests NONE  Final   Gram Stain   Final    FEW WBC PRESENT, PREDOMINANTLY PMN FEW GRAM POSITIVE COCCI IN PAIRS    Culture NORMAL SKIN FLORA  Final   Report Status PENDING  Incomplete     Scheduled Meds: . [START ON 10/12/2015] darbepoetin (ARANESP)  injection - DIALYSIS  100 mcg Intravenous Q Fri-HD  . doxycycline  100 mg Oral Q12H  . feeding supplement  1 Container Oral TID BM  . feeding supplement (PRO-STAT SUGAR FREE 64)  30 mL Oral BID  . gabapentin  300 mg Oral QPM  . levothyroxine  150 mcg Oral QAC breakfast  . lidocaine  1 patch Transdermal QHS  . metoprolol tartrate  12.5 mg Oral BID  . multivitamin  1 tablet Oral QHS  . senna-docusate  1 tablet Oral Daily  . tacrolimus  1 mg Oral QHS  . tacrolimus  2 mg Oral Daily  . Warfarin - Pharmacist Dosing Inpatient   Does not apply q1800   Continuous Infusions: . heparin 800 Units/hr (10/10/15 1139)    Procedures/Studies: Dg Foot Complete Left  Result Date: 10/08/2015 CLINICAL DATA:  Left foot pain and swelling from ulcer to heel of foot, history diabetes, numbness and cold sensation darkening in color of toes, EXAM: LEFT FOOT - COMPLETE 3+ VIEW COMPARISON:  None. FINDINGS: No evidence of acute fracture or dislocation in the left foot. No focal bone erosion or cortical destruction to suggest osteomyelitis. Prominent plantar and Achilles spurs of the calcaneus. Mild degenerative changes in the interphalangeal joints and first metatarsal-phalangeal joint. Extensive vascular calcifications. IMPRESSION: No acute bony abnormalities. Electronically Signed   By: Lucienne Capers M.D.   On: 10/08/2015 21:30    Kyree Fedorko, DO  [facility] Pager [phone]  If 7PM-7AM, please contact night-coverage [email or web address] Password [email or web address] 10/11/2015, 3:38 PM   LOS: 2 days

## 2015-10-12 DIAGNOSIS — L03119 Cellulitis of unspecified part of limb: Secondary | ICD-10-CM

## 2015-10-12 LAB — RENAL FUNCTION PANEL
ALBUMIN: 1.5 g/dL — AB (ref 3.5–5.0)
Anion gap: 7 (ref 5–15)
BUN: 16 mg/dL (ref 6–20)
CO2: 28 mmol/L (ref 22–32)
CREATININE: 5.12 mg/dL — AB (ref 0.44–1.00)
Calcium: 8.6 mg/dL — ABNORMAL LOW (ref 8.9–10.3)
Chloride: 102 mmol/L (ref 101–111)
GFR calc Af Amer: 10 mL/min — ABNORMAL LOW (ref >60)
GFR, EST NON AFRICAN AMERICAN: 8 mL/min — AB (ref >60)
Glucose, Bld: 122 mg/dL — ABNORMAL HIGH (ref 65–99)
PHOSPHORUS: 6 mg/dL — AB (ref 2.5–4.6)
Potassium: 3.5 mmol/L (ref 3.5–5.1)
SODIUM: 137 mmol/L (ref 135–145)

## 2015-10-12 LAB — HEPARIN LEVEL (UNFRACTIONATED): Heparin Unfractionated: 0.37 IU/mL (ref 0.30–0.70)

## 2015-10-12 LAB — PROTIME-INR
INR: 1.75
PROTHROMBIN TIME: 20.7 s — AB (ref 11.4–15.2)

## 2015-10-12 LAB — CBC
HEMATOCRIT: 24.8 % — AB (ref 36.0–46.0)
HEMOGLOBIN: 8.1 g/dL — AB (ref 12.0–15.0)
MCH: 28.8 pg (ref 26.0–34.0)
MCHC: 32.7 g/dL (ref 30.0–36.0)
MCV: 88.3 fL (ref 78.0–100.0)
Platelets: 154 10*3/uL (ref 150–400)
RBC: 2.81 MIL/uL — AB (ref 3.87–5.11)
RDW: 16.2 % — ABNORMAL HIGH (ref 11.5–15.5)
WBC: 8.2 10*3/uL (ref 4.0–10.5)

## 2015-10-12 LAB — AEROBIC CULTURE  (SUPERFICIAL SPECIMEN): CULTURE: NORMAL

## 2015-10-12 LAB — AEROBIC CULTURE W GRAM STAIN (SUPERFICIAL SPECIMEN)

## 2015-10-12 MED ORDER — SODIUM CHLORIDE 0.9 % IV SOLN: 100.0000 mL | INTRAVENOUS | Status: DC | PRN

## 2015-10-12 MED ORDER — HEPARIN SODIUM (PORCINE) 1000 UNIT/ML DIALYSIS: 1000.0000 [IU] | INTRAMUSCULAR | Status: DC | PRN

## 2015-10-12 MED ORDER — LIDOCAINE HCL (PF) 1 % IJ SOLN: 5.0000 mL | INTRAMUSCULAR | Status: DC | PRN

## 2015-10-12 MED ORDER — WARFARIN SODIUM 4 MG PO TABS
4.0000 mg | ORAL_TABLET | Freq: Once | ORAL | Status: AC
  Administered 2015-10-12: 4 mg via ORAL
  Filled 2015-10-12: qty 1

## 2015-10-12 MED ORDER — PENTAFLUOROPROP-TETRAFLUOROETH EX AERO: 1.0000 "application " | INHALATION_SPRAY | CUTANEOUS | Status: DC | PRN

## 2015-10-12 MED ORDER — LIDOCAINE-PRILOCAINE 2.5-2.5 % EX CREA: 1.0000 "application " | TOPICAL_CREAM | CUTANEOUS | Status: DC | PRN

## 2015-10-12 MED ORDER — WARFARIN SODIUM 2 MG PO TABS
2.0000 mg | ORAL_TABLET | Freq: Once | ORAL | Status: DC
  Filled 2015-10-12: qty 1

## 2015-10-12 MED ORDER — CALCIUM ACETATE (PHOS BINDER) 667 MG PO CAPS
667.0000 mg | ORAL_CAPSULE | Freq: Three times a day (TID) | ORAL | Status: DC
  Administered 2015-10-12 – 2015-10-16 (×4): 667 mg via ORAL
  Filled 2015-10-12 (×7): qty 1

## 2015-10-12 MED ORDER — ASPIRIN 81 MG PO CHEW
81.0000 mg | CHEWABLE_TABLET | Freq: Every day | ORAL | Status: DC
  Administered 2015-10-13 – 2015-10-14 (×2): 81 mg via ORAL
  Filled 2015-10-12 (×3): qty 1

## 2015-10-12 MED ORDER — DARBEPOETIN ALFA 100 MCG/0.5ML IJ SOSY
PREFILLED_SYRINGE | INTRAMUSCULAR | Status: AC
  Filled 2015-10-12: qty 0.5

## 2015-10-12 NOTE — Progress Notes (Signed)
PROGRESS NOTE  JAYLYNE BREESE JDY:[id number] DOB: Oct 19, 1955 DOA: 10/08/2015 PCP: [name], MD  Brief History: HPI on 10/08/15 by Dr. [name] [name] medical history significant of ESRD on HD(M/W/F), DM type 2, s/p Liver transplant,on chronic anticoagulation, CAD, hypothyroidism, PVD, COPD; who presents with complaints of progressively worsening foot pain over the last 4 weeks. Pain is located in the left heel and it is described assharp and stabbing in nature. Initially, symptoms were intermittent,but have become more constant and severe. Associated symptoms included swelling of the foot, more darkened appearance and discoloration of the toes on the left foot, foot is cold to the touch, and numbness sensation of the toes over the last week. Bearing weight on the affected foot worsens symptoms. She takes oxycodone with some relief of symptoms. Patient's daughter called her vascular surgeon's office 8/28and it was recommended for her to come to the hospital for further evaluation. 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 The patient presented with nausea vomiting and abdominal pain. CT of the abdomen and pelvis at that time revealed spike infarct with ring-enhancing collection of fluid. Previous medical records reviewed and summarized  Assessment/Plan: Left foot pain--tip of calcaneus -mild calcaneal edema, cannot appreciate erythema or drainage -Foot xray of the left foot: No acute bony abnormalities, No foreign bodies  -Discontinue all intravenous antibiotics-->remained stable off antibiotics  -Suspect the patient has a resolving soft tissue injury as indicated by some ecchymosis on her calcaneus and tiny area of healing skin injury  -ESR 38, CRP 1.1 -lower extremity  duplex--neg for DVT -Review of the notes from [facility] reveals that she has had chronic feet pain predating her splenectomy--they recommended pain management consultation -ABI on LLE--noncompressible  End-stage renal disease -Appreciate Nephrology -Patient dialyzes on Monday, Wednesday, Friday  Diabetes mellitus, type II -08/23/2015 hemoglobin A1c 4.2 -Discontinue CBGs, insulin sliding scale  -10/10/15 A1C--5.0  Essential hypertension -Continue metoprolol tartrate -BP has been labile  Aortic valve replacement (mechanical)/subtherapeutic INR -Patient had some bleeding issues recently however was restarted back on her Coumadin -Continue heparin GTT for bridging -unable to use lovenox due to ESRD  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   Objective: Vitals:   10/12/15 1445 10/12/15 1500 10/12/15 1530 10/12/15 1600  BP: (!) 120/53 (!) 107/55 126/60 125/61  Pulse: 71 80 69 78  Resp:      Temp:      TempSrc:      SpO2:      Weight:      Height:        Intake/Output Summary (Last 24 hours) at 10/12/15 1616 Last data filed at 10/12/15 0833  Gross per 24 hour  Intake           489.87 ml  Output                0 ml  Net  489.87 ml   Weight change:  Exam:   General:  Pt is alert, follows commands appropriately, not in acute distress  HEENT: No icterus, No thrush, No neck mass, [place]/AT  Cardiovascular: RRR, S1/S2, no rubs, no gallops  Respiratory: CTA  bilaterally, no wheezing, no crackles, no rhonchi  Abdomen: Soft/+BS, non tender, non distended, no guarding  Extremities: No edema, No lymphangitis, No petechiae, No rashes, no synovitis;mild ecchymosis at the left calcaneus with a small area of skin breakdown at the site. No purulent drainage, no erythema, no necrosis, no crepitance   Data Reviewed: I have personally reviewed following labs and imaging studies Basic Metabolic Panel:  Recent Labs Lab 10/08/15 1540 10/09/15 0848 10/10/15 0451 10/12/15 0649  NA 138 139 138 137  K 3.9 4.2 4.0 3.5  CL 101 105 102 102  CO2 28 30 27 28   GLUCOSE 170* 117* 133* 122*  BUN <5* 9 16 16   CREATININE 2.58* 3.65* 4.54* 5.12*  CALCIUM 8.6* 8.3* 8.4* 8.6*  PHOS  --   --   --  6.0*   Liver Function Tests:  Recent Labs Lab 10/12/15 0649  ALBUMIN 1.5*   No results for input(s): LIPASE, AMYLASE in the last 168 hours. No results for input(s): AMMONIA in the last 168 hours. Coagulation Profile:  Recent Labs Lab 10/08/15 2327 10/09/15 0848 10/10/15 0451 10/11/15 0332 10/12/15 0649  INR 1.21 1.29 1.38 1.60 1.75   CBC:  Recent Labs Lab 10/08/15 1540 10/09/15 0848 10/10/15 0451 10/11/15 0332 10/12/15 0649  WBC 7.9 8.7 7.5 7.8 8.2  HGB 10.7* 8.1* 8.7* 8.4* 8.1*  HCT 32.9* 25.2* 27.0* 25.6* 24.8*  MCV 90.6 90.3 89.7 88.0 88.3  PLT 130* 138* 153 130* 154   Cardiac Enzymes: No results for input(s): CKTOTAL, CKMB, CKMBINDEX, TROPONINI in the last 168 hours. BNP: Invalid input(s): POCBNP CBG:  Recent Labs Lab 10/10/15 2128 10/11/15 0827 10/11/15 1229 10/11/15 1649 10/11/15 2000  GLUCAP 152* 87 141* 136* 180*   HbA1C:  Recent Labs  10/10/15 0455  HGBA1C 5.0   Urine analysis:    Component Value Date/Time   COLORURINE YELLOW 11/02/2012 0428   APPEARANCEUR CLOUDY (A) 11/02/2012 0428   LABSPEC 1.011 11/02/2012 0428   PHURINE 8.0 11/02/2012 0428   GLUCOSEU 250 (A) 11/02/2012 0428   HGBUR LARGE (A) 11/02/2012 0428    BILIRUBINUR NEGATIVE 11/02/2012 0428   KETONESUR NEGATIVE 11/02/2012 0428   PROTEINUR >300 (A) 11/02/2012 0428   UROBILINOGEN 0.2 11/02/2012 0428   NITRITE NEGATIVE 11/02/2012 0428   LEUKOCYTESUR NEGATIVE 11/02/2012 0428   Sepsis Labs: @LABRCNTIP (procalcitonin:4,lacticidven:4) ) Recent Results (from the past 240 hour(s))  Culture, blood (routine x 2)     Status: None (Preliminary result)   Collection Time: 10/08/15  8:17 PM  Result Value Ref Range Status   Specimen Description BLOOD RIGHT HAND  Final   Special Requests BOTTLES DRAWN AEROBIC AND ANAEROBIC 5CC EACH  Final   Culture NO GROWTH 4 DAYS  Final   Report Status PENDING  Incomplete  Culture, blood (routine x 2)     Status: None (Preliminary result)   Collection Time: 10/08/15  8:18 PM  Result Value Ref Range Status   Specimen Description BLOOD LEFT ANTECUBITAL  Final   Special Requests BOTTLES DRAWN AEROBIC AND ANAEROBIC 5CC EACH  Final   Culture NO GROWTH 4 DAYS  Final   Report Status PENDING  Incomplete  MRSA PCR Screening     Status: None   Collection Time: 10/09/15  1:12  AM  Result Value Ref Range Status   MRSA by PCR NEGATIVE NEGATIVE Final    Comment:        The GeneXpert MRSA Assay (FDA approved for NASAL specimens only), is one component of a comprehensive MRSA colonization surveillance program. It is not intended to diagnose MRSA infection nor to guide or monitor treatment for MRSA infections.   Aerobic Culture (superficial specimen)     Status: None   Collection Time: 10/09/15  2:53 PM  Result Value Ref Range Status   Specimen Description DRAINAGE HEEL  Final   Special Requests NONE  Final   Gram Stain   Final    FEW WBC PRESENT, PREDOMINANTLY PMN FEW GRAM POSITIVE COCCI IN PAIRS    Culture NORMAL SKIN FLORA  Final   Report Status 10/12/2015 FINAL  Final     Scheduled Meds: . calcium acetate  667 mg Oral TID WC  . darbepoetin (ARANESP) injection - DIALYSIS  100 mcg Intravenous Q Fri-HD  .  doxycycline  100 mg Oral Q12H  . feeding supplement  1 Container Oral TID BM  . feeding supplement (PRO-STAT SUGAR FREE 64)  30 mL Oral BID  . gabapentin  300 mg Oral QPM  . levothyroxine  150 mcg Oral QAC breakfast  . lidocaine  1 patch Transdermal QHS  . metoprolol tartrate  12.5 mg Oral BID  . multivitamin  1 tablet Oral QHS  . senna-docusate  1 tablet Oral Daily  . tacrolimus  1 mg Oral QHS  . tacrolimus  2 mg Oral Daily  . warfarin  2 mg Oral ONCE-1800  . Warfarin - Pharmacist Dosing Inpatient   Does not apply q1800   Continuous Infusions: . heparin 800 Units/hr (10/11/15 1731)    Procedures/Studies: Dg Foot Complete Left  Result Date: 10/08/2015 CLINICAL DATA:  Left foot pain and swelling from ulcer to heel of foot, history diabetes, numbness and cold sensation darkening in color of toes, EXAM: LEFT FOOT - COMPLETE 3+ VIEW COMPARISON:  None. FINDINGS: No evidence of acute fracture or dislocation in the left foot. No focal bone erosion or cortical destruction to suggest osteomyelitis. Prominent plantar and Achilles spurs of the calcaneus. Mild degenerative changes in the interphalangeal joints and first metatarsal-phalangeal joint. Extensive vascular calcifications. IMPRESSION: No acute bony abnormalities. Electronically Signed   By: Lucienne Capers M.D.   On: 10/08/2015 21:30    Albertha Beattie, DO  [facility] Pager [phone]  If 7PM-7AM, please contact night-coverage [email or web address] Password [email or web address] 10/12/2015, 4:16 PM   LOS: 3 days

## 2015-10-12 NOTE — Progress Notes (Signed)
Pharmacy:  Stopped by for Coumadin education. Very familiar, with long-term use for AVR.  Patient requested to take Coumadin 4 mg today instead of 2 mg, with INR 1.75.  She also reported that she has been on Aspirin 81 mg daily, but she had recently run out and forgot to mention that when she gave her med list at admission.  Notified Dr. Carles Collet.  Plan:  Coumadin 4 mg x 1 tonight.  Continues on Heparin drip at 800 units/hr.  Continue daily heparin level, PT/INR, and CBC.  Aspirin 81 mg daily to resume on 10/13/15.  Doxycycline expected to continue for 5 days per Dr. Carles Collet.  Arty Baumgartner, [place] Pager: [phone] 10/12/2015 6:14 PM

## 2015-10-12 NOTE — Progress Notes (Addendum)
ANTICOAGULATION CONSULT NOTE - Follow Up Consult  Pharmacy Consult for Heparin  Indication: h/o  AVR mechanical (on coumadin PTA)  Allergies  Allergen Reactions  . Acetaminophen Other (See Comments)    Liver transplant recipient   . Codeine Itching  . Mirtazapine Other (See Comments)    hallucination  . Morphine Itching and Other (See Comments)    hallucinate    Patient Measurements: Height: 5\' 4"  (162.6 cm) Weight: 119 lb 4.3 oz (54.1 kg) (Standing scale) IBW/kg (Calculated) : 54.7  Vital Signs: Temp: 98.4 F (36.9 C) (09/01 0832) Temp Source: Oral (09/01 0832) BP: 143/53 (09/01 0832) Pulse Rate: 72 (09/01 0832)  Labs:  Recent Labs  10/10/15 0451  10/10/15 1217 10/11/15 0332 10/12/15 0649  HGB 8.7*  --   --  8.4* 8.1*  HCT 27.0*  --   --  25.6* 24.8*  PLT 153  --   --  130* 154  LABPROT 17.1*  --   --  19.2* 20.7*  INR 1.38  --   --  1.60 1.75  HEPARINUNFRC  --   < > 0.54 0.43 0.37  CREATININE 4.54*  --   --   --  5.12*  < > = values in this interval not displayed.  Estimated Creatinine Clearance: 9.9 mL/min (by C-G formula based on SCr of 5.12 mg/dL).     Assessment: 60 y.o female, on a IV Heparin infusion while INR is subtherapeutic. Goal INR is 2.5-3.0 for h/o mechanical AVR.  [place] office visit notes goal INR 2.5-3.0 and notes that patient bleeds easily after dialysis.  LE doppler neg for DVT Heparin level = 0.37 on 800 units/hr IV heparin drip.  INR = 1.75 trending upward toward goal. Goal INR 2.5-3.0  No bleeding reported. CBC is low /stable On oral doxycycline-which may increase hypothrombinemic effect of coumadin.  I have reviewed her outpatient anticoag office visit notes and INR results.  She has been on a range of dosage regimens and INR varied therapeutic to below 2.5 goal and increased x1 to 5.2 Dosage regimens noted :  2mg  MWFWSun & 4mg  TTSat  ---->decreased to  2mg  daily except 4mg  qTue,Sat , INR thereapeutic for  a while then fell to 1.8  and  Dose increased to ---> 4mg  TTSS, 2mg  MWF ----> 04/26/15 INR 1.8,  dose incr'd to 4mg  TTSS, 2mg  MWF  05/03/15 INR 3.02-thus dose decreased to 2mg  qMWFSun & 4mg  TTS  INR 2.5-3.0, therapeutic until ~ 07/2015 07/13/15 in [place] for splenic infarct or bleed? 6/8 INR 2.0 on 2mg  daily 6/22 INR 2.3  on 2mg  daily  Remained at goal til 7/7: INR 1.5: gave 4mg  x1 then cont 2mg  daily 7/28 INR 1.8- gave 4mg  x1 then cont 2mg  daily 8/4 INR 1.8- no change 8/21 INR 2.8 on 2mg  daily 8/25 INR 1.7 -gave 4mg  x1 then cont 2mg  daily Then patient admitted here 10/08/15 INR = 1.21  This admission she has received coumadin 4mg , 4mg , 4mg  and 2mg  for the last 4 days.  INR trend 1.21>1.29>1.38>1.6>1.75 Continues on oral doxycycline-which may increase hypothrombinemic effect of coumadin.   Goal of Therapy:  INR 2.5-3.0 Heparin level 0.3-0.7 units/ml Monitor platelets by anticoagulation protocol: Yes     Plan:  Continue IV heparin 800 units/hr Coumadin 2 mg po today x1 Dialy Heparin level, INR, CBC   Nicole Cella, RPh Clinical Pharmacist Pager: [phone] 10/12/2015,11:13 AM

## 2015-10-12 NOTE — Care Management Important Message (Signed)
Important Message  Patient Details  Name: [name] MRN: [id number] Date of Birth: 08/06/1955   Medicare Important Message Given:  Yes    Orbie Pyo 10/12/2015, 10:46 AM

## 2015-10-12 NOTE — Progress Notes (Signed)
[facility] Progress Note  Assessment/Plan: 1. Left foot cellulitis - per Primary - improving abx d/c - LE doppler neg for DVT 2. ESRD- MWF HD - cont on schedule K+ 3.5 - for HD today 4K bath no heparin  3. Hypertension/volume- BP stable OP Bps 100-130s post HD HD on 8/30 with  net UF 1303 mL post weight 54.1 kg - unusually heavy for her OP pre weights usually only 1-2 kg over EDW - question weight since she is having trouble standing on foot 4. Anemia- Hgb 8.7 - due for next ESA dose - Aranesp 100 mcg today with HD  5. Metabolic bone disease- Ca 8.4 - calcitriol held at OP center for high corr Ca Phos 6.0 - add 1 PhosLo qac  6. Nutrition- Alb 2.0  renal diet/ fluid restriction vitamins/ protein supplement - hope 7. S/p mechanical aortic valve replacement- per primary on chronic anticoagulation therapy - INR subtherapeutic on admission 8. Diabetes mellitus type 2 - per primary  9. S/p Liver transplant- on prograf  [name], PA-C [facility] 10/12/2015,9:06 AM  LOS: 3 days   Renal Attending: Her heel is improving.  We will plan for HD today prior to discharge to keep her on schedule. George Haggart C   Subjective:   Feels good today. Foot pain continues to improve able to bear weight on foot. Appetite improving. For HD today.   Objective Vitals:   10/11/15 0911 10/11/15 2005 10/12/15 0602 10/12/15 0832  BP: (!) 113/56 (!) 143/46 (!) 101/46 (!) 143/53  Pulse: 60 (!) 103 69 72  Resp: 16 16 16 17   Temp: 97.4 F (36.3 C) 98.4 F (36.9 C) 98.3 F (36.8 C) 98.4 F (36.9 C)  TempSrc: Oral   Oral  SpO2: 100% 98% 100% 100%  Weight:      Height:       Physical Exam General: Frail appearing female NAD Heart: RRR S1S2 Lungs:CTAB breathing unlabored Abdomen: soft NT ND Extremities: swelling to dorsal surface of left foot improved from 8/31 exam no drainage; heel tender to palpation; no lower leg edema  Dialysis Access: L thigh AVG  + thrill   Dialysis Orders: [place] MWF 3:45 180 F 450/A 1.5x 3K/2.25 Ca Na model: linear EDW 51.5 kg LLE AVG No Heparin  Mircera 50 mcg IV q 4 weeks (last dose 8/2)  OP Labs: Hgb 9.9 K 3.7 Ca 8.8 P 4.4 iPTH 44 Alb 2.0  Additional Objective Labs: Basic Metabolic Panel:  Recent Labs Lab 10/09/15 0848 10/10/15 0451 10/12/15 0649  NA 139 138 137  K 4.2 4.0 3.5  CL 105 102 102  CO2 30 27 28   GLUCOSE 117* 133* 122*  BUN 9 16 16   CREATININE 3.65* 4.54* 5.12*  CALCIUM 8.3* 8.4* 8.6*  PHOS  --   --  6.0*   Liver Function Tests:  Recent Labs Lab 10/12/15 0649  ALBUMIN 1.5*   No results for input(s): LIPASE, AMYLASE in the last 168 hours. CBC:  Recent Labs Lab 10/08/15 1540 10/09/15 0848 10/10/15 0451 10/11/15 0332 10/12/15 0649  WBC 7.9 8.7 7.5 7.8 8.2  HGB 10.7* 8.1* 8.7* 8.4* 8.1*  HCT 32.9* 25.2* 27.0* 25.6* 24.8*  MCV 90.6 90.3 89.7 88.0 88.3  PLT 130* 138* 153 130* 154   Blood Culture    Component Value Date/Time   SDES DRAINAGE HEEL 10/09/2015 1453   SPECREQUEST NONE 10/09/2015 1453   CULT NORMAL SKIN FLORA 10/09/2015 1453   REPTSTATUS 10/12/2015 FINAL 10/09/2015 1453  Cardiac Enzymes: No results for input(s): CKTOTAL, CKMB, CKMBINDEX, TROPONINI in the last 168 hours. CBG:  Recent Labs Lab 10/10/15 2128 10/11/15 0827 10/11/15 1229 10/11/15 1649 10/11/15 2000  GLUCAP 152* 87 141* 136* 180*   Iron Studies: No results for input(s): IRON, TIBC, TRANSFERRIN, FERRITIN in the last 72 hours. Lab Results  Component Value Date   INR 1.75 10/12/2015   INR 1.60 10/11/2015   INR 1.38 10/10/2015   Studies/Results: No results found. Medications: . heparin 800 Units/hr (10/11/15 1731)   . darbepoetin (ARANESP) injection - DIALYSIS  100 mcg Intravenous Q Fri-HD  . doxycycline  100 mg Oral Q12H  . feeding supplement  1 Container Oral TID BM  . feeding supplement (PRO-STAT SUGAR FREE 64)  30 mL Oral BID  . gabapentin  300 mg Oral QPM  .  levothyroxine  150 mcg Oral QAC breakfast  . lidocaine  1 patch Transdermal QHS  . metoprolol tartrate  12.5 mg Oral BID  . multivitamin  1 tablet Oral QHS  . senna-docusate  1 tablet Oral Daily  . tacrolimus  1 mg Oral QHS  . tacrolimus  2 mg Oral Daily  . Warfarin - Pharmacist Dosing Inpatient   Does not apply [phone]

## 2015-10-13 DIAGNOSIS — E1121 Type 2 diabetes mellitus with diabetic nephropathy: Secondary | ICD-10-CM

## 2015-10-13 LAB — CULTURE, BLOOD (ROUTINE X 2)
CULTURE: NO GROWTH
CULTURE: NO GROWTH

## 2015-10-13 LAB — PROTIME-INR
INR: 1.68
Prothrombin Time: 19.9 seconds — ABNORMAL HIGH (ref 11.4–15.2)

## 2015-10-13 LAB — CBC
HEMATOCRIT: 25.1 % — AB (ref 36.0–46.0)
Hemoglobin: 8.2 g/dL — ABNORMAL LOW (ref 12.0–15.0)
MCH: 29 pg (ref 26.0–34.0)
MCHC: 32.7 g/dL (ref 30.0–36.0)
MCV: 88.7 fL (ref 78.0–100.0)
Platelets: 155 10*3/uL (ref 150–400)
RBC: 2.83 MIL/uL — ABNORMAL LOW (ref 3.87–5.11)
RDW: 16.5 % — AB (ref 11.5–15.5)
WBC: 9 10*3/uL (ref 4.0–10.5)

## 2015-10-13 LAB — HEPARIN LEVEL (UNFRACTIONATED): Heparin Unfractionated: 0.39 IU/mL (ref 0.30–0.70)

## 2015-10-13 MED ORDER — WARFARIN SODIUM 4 MG PO TABS
4.0000 mg | ORAL_TABLET | Freq: Once | ORAL | Status: AC
  Administered 2015-10-13: 4 mg via ORAL
  Filled 2015-10-13: qty 1

## 2015-10-13 NOTE — Progress Notes (Signed)
[facility] Progress Note  Assessment/Plan: 1. Left foot pain -off abtx improved , no LE DVT, ABI on LLE noncompressible 2. ESRD - MWF K 3.5 yesterday- next HD Monday if not d/c before then 3. Anemia - hgb 8.2 a/p 100 Aranesp 9/1 - not clear why hgb trending down -  last outpt tsat was 53% 8/23- may just be ESA deficit - had been on Mircera 50 q 4  Then -last ESA dose was 75 on 8/2 4. Secondary hyperparathyroidism - Ca 8.6 P 6 - no VDRA - on phoslo 1 ac - no change in dosing for now - needs f/u after d/c 5. HTN/volume - BP ok this am; net UF 1.3 on Wed and 1971 on Friday with post weight 53.7 - still 2 kg above outpt EDW; lungs are clear; MTP 12.5 bid 6. Nutrition - alb 1.5 - severe PCM- supplements ordered 7. Hx AVR - on chronic coumadin/heparin per pharmacy; INR not therapeutic yet 8. Hx liver transplant  Myriam Jacobson, PA-C [place] [phone] 10/13/2015,8:11 AM  LOS: 4 days    Renal Attending: Foot better, now on PO doxy.  Cellulitis improved. For HD MWF. Mahkayla Preece C   Subjective:   Feels good. No problems with HD yesterday  Objective Vitals:   10/12/15 1642 10/12/15 1811 10/12/15 2100 10/13/15 0531  BP: (!) 144/59 (!) 152/50 (!) 142/34 (!) 118/34  Pulse: 68 70 66 67  Resp: 16 15 15 18   Temp: 98.1 F (36.7 C) 98.8 F (37.1 C) 98.3 F (36.8 C) 98.5 F (36.9 C)  TempSrc: Oral Oral Oral Oral  SpO2: 100% 100% 100% 100%  Weight: 53.7 kg (118 lb 6.2 oz)  55 kg (121 lb 4.1 oz)   Height:       Physical Exam General: thin NAD Heart: RRR Lungs: no rales Abdomen: soft NT Extremities: no LE edema- heel tender to palpation Dialysis Access: left thigh AVGG + bruit  Dialysis Orders:  [place] MWF 3:45 180 F 450/A 1.5x 3K/2.25 Ca Na model: linear EDW 51.5 kg LLE AVG No Heparin  Mircera last dose 75 on 8/2)  OP Labs: Hgb 9.9 K 3.7 Ca 8.8 P 4.4 iPTH 44 Alb 2.0- last tsat 53%   Additional Objective Labs: Basic Metabolic  Panel:  Recent Labs Lab 10/09/15 0848 10/10/15 0451 10/12/15 0649  NA 139 138 137  K 4.2 4.0 3.5  CL 105 102 102  CO2 30 27 28   GLUCOSE 117* 133* 122*  BUN 9 16 16   CREATININE 3.65* 4.54* 5.12*  CALCIUM 8.3* 8.4* 8.6*  PHOS  --   --  6.0*   Liver Function Tests:  Recent Labs Lab 10/12/15 0649  ALBUMIN 1.5*   CBC:  Recent Labs Lab 10/09/15 0848 10/10/15 0451 10/11/15 0332 10/12/15 0649 10/13/15 0300  WBC 8.7 7.5 7.8 8.2 9.0  HGB 8.1* 8.7* 8.4* 8.1* 8.2*  HCT 25.2* 27.0* 25.6* 24.8* 25.1*  MCV 90.3 89.7 88.0 88.3 88.7  PLT 138* 153 130* 154 155   Blood Culture    Component Value Date/Time   SDES DRAINAGE HEEL 10/09/2015 1453   SPECREQUEST NONE 10/09/2015 1453   CULT NORMAL SKIN FLORA 10/09/2015 1453   REPTSTATUS 10/12/2015 FINAL 10/09/2015 1453   CBG:  Recent Labs Lab 10/10/15 2128 10/11/15 0827 10/11/15 1229 10/11/15 1649 10/11/15 2000  GLUCAP 152* 87 141* 136* 180*   Iron Studies: No results for input(s): IRON, TIBC, TRANSFERRIN, FERRITIN in the last 72 hours. Lab Results  Component  Value Date   INR 1.68 10/13/2015   INR 1.75 10/12/2015   INR 1.60 10/11/2015   Studies/Results: No results found. Medications: . heparin 800 Units/hr (10/13/15 0022)   . aspirin  81 mg Oral Daily  . calcium acetate  667 mg Oral TID WC  . darbepoetin (ARANESP) injection - DIALYSIS  100 mcg Intravenous Q Fri-HD  . doxycycline  100 mg Oral Q12H  . feeding supplement  1 Container Oral TID BM  . feeding supplement (PRO-STAT SUGAR FREE 64)  30 mL Oral BID  . gabapentin  300 mg Oral QPM  . levothyroxine  150 mcg Oral QAC breakfast  . lidocaine  1 patch Transdermal QHS  . metoprolol tartrate  12.5 mg Oral BID  . multivitamin  1 tablet Oral QHS  . senna-docusate  1 tablet Oral Daily  . tacrolimus  1 mg Oral QHS  . tacrolimus  2 mg Oral Daily  . Warfarin - Pharmacist Dosing Inpatient   Does not apply [phone]

## 2015-10-13 NOTE — Progress Notes (Signed)
PROGRESS NOTE  URA YINGLING HVF:[id number] DOB: Jul 30, 1955 DOA: 10/08/2015 PCP: [name], MD   Brief History: HPI on 10/08/15 by Dr. [name] [name] medical history significant of ESRD on HD(M/W/F), DM type 2, s/p Liver transplant,on chronic anticoagulation, CAD, hypothyroidism, PVD, COPD; who presents with complaints of progressively worsening foot pain over the last 4 weeks. Pain is located in the left heel and it is described assharp and stabbing in nature. Initially, symptoms were intermittent,but have become more constant and severe. Associated symptoms included swelling of the foot, more darkened appearance and discoloration of the toes on the left foot, foot is cold to the touch, and numbness sensation of the toes over the last week. Bearing weight on the affected foot worsens symptoms. She takes oxycodone with some relief of symptoms. Patient's daughter called her vascular surgeon's office 8/28and it was recommended for her to come to the hospital for further evaluation. 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  In July 2017,The patient presented with nausea vomiting and abdominal pain at [facility]. CT of the abdomen and pelvis at that time revealed spike infarct with ring-enhancing collection of fluid. 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 Previous medical records reviewed and summarized  Assessment/Plan: Left foot pain--tip of calcaneus -mild calcaneal edema, cannot appreciate erythema or drainage -Foot xray of the left foot: No acute bony abnormalities, No foreign bodies  -Discontinue all intravenous antibiotics-->remained stable off antibiotics  -Suspect the patient has a resolving soft tissue injury as indicated by some ecchymosis on her calcaneus and tiny area of healing skin injury  -ESR 38, CRP  1.1 -lower extremity duplex--neg for DVT -Review of the notes from [facility] reveals that she has had chronic feet pain predating her splenectomy--they recommended pain management consultation -ABI on LLE--noncompressible secondary to graft  End-stage renal disease -Appreciate Nephrology -Patient dialyzes on Monday, Wednesday, Friday  Diabetes mellitus, type II -08/23/2015 hemoglobin A1c 4.2 -Discontinue CBGs, insulin sliding scale  -10/10/15 A1C--5.0  Essential hypertension -Continue metoprolol tartrate -BP has been labile  Aortic valve replacement (mechanical)/subtherapeutic INR -Patient had some bleeding issues recently however was restarted back on her Coumadin -Continue heparin GTT for bridging -unable to use lovenox due to ESRD  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   Objective: Vitals:   10/12/15 1811 10/12/15 2100 10/13/15 0531 10/13/15 0848  BP: (!) 152/50 (!) 142/34 (!) 118/34 136/71  Pulse: 70 66 67 74  Resp: _0 Temp: 98.8 F (37.1 C) 98.3 F (36.8 C) 98.5 F (36.9 C) 98.1 F (36.7 C)  TempSrc: Oral Oral Oral Oral  SpO2: 100% 100% 100% 100%  Weight:  55 kg (121 lb 4.1 oz)    Height:        Intake/Output Summary (Last 24 hours) at 10/13/15 1521 Last data filed at 10/13/15 1435  Gross per 24 hour  Intake           914.13 ml  Output  1971 ml  Net         -1056.87 ml   Weight change:  Exam:   General:  Pt is alert,  follows commands appropriately, not in acute distress  HEENT: No icterus, No thrush, No neck mass, [place]/AT  Cardiovascular: RRR, S1/S2, no rubs, no gallops  Respiratory: Fine bibasilar crackles. No wheezing. Good air movement.  Abdomen: Soft/+BS, non tender, non distended, no guarding  Extremities: No edema, No lymphangitis, No petechiae, No rashes, no synovitis--mild ecchymosis at the left calcaneus with a small area of skin breakdown at the site. No purulent drainage, no erythema, no necrosis, no crepitance   Data Reviewed: I have personally reviewed following labs and imaging studies Basic Metabolic Panel:  Recent Labs Lab 10/08/15 1540 10/09/15 0848 10/10/15 0451 10/12/15 0649  NA 138 139 138 137  K 3.9 4.2 4.0 3.5  CL 101 105 102 102  CO2 _0 GLUCOSE 170* 117* 133* 122*  BUN <5* _1 CREATININE 2.58* 3.65* 4.54* 5.12*  CALCIUM 8.6* 8.3* 8.4* 8.6*  PHOS  --   --   --  6.0*   Liver Function Tests:  Recent Labs Lab 10/12/15 0649  ALBUMIN 1.5*   No results for input(s): LIPASE, AMYLASE in the last 168 hours. No results for input(s): AMMONIA in the last 168 hours. Coagulation Profile:  Recent Labs Lab 10/09/15 0848 10/10/15 0451 10/11/15 0332 10/12/15 0649 10/13/15 0300  INR 1.29 1.38 1.60 1.75 1.68   CBC:  Recent Labs Lab 10/09/15 0848 10/10/15 0451 10/11/15 0332 10/12/15 0649 10/13/15 0300  WBC 8.7 7.5 7.8 8.2 9.0  HGB 8.1* 8.7* 8.4* 8.1* 8.2*  HCT 25.2* 27.0* 25.6* 24.8* 25.1*  MCV 90.3 89.7 88.0 88.3 88.7  PLT 138* 153 130* 154 155   Cardiac Enzymes: No results for input(s): CKTOTAL, CKMB, CKMBINDEX, TROPONINI in the last 168 hours. BNP: Invalid input(s): POCBNP CBG:  Recent Labs Lab 10/10/15 2128 10/11/15 0827 10/11/15 1229 10/11/15 1649 10/11/15 2000  GLUCAP 152* 87 141* 136* 180*   HbA1C: No results for input(s): HGBA1C in the last 72 hours. Urine analysis:    Component Value Date/Time   COLORURINE YELLOW  11/02/2012 0428   APPEARANCEUR CLOUDY (A) 11/02/2012 0428   LABSPEC 1.011 11/02/2012 0428   PHURINE 8.0 11/02/2012 0428   GLUCOSEU 250 (A) 11/02/2012 0428   HGBUR LARGE (A) 11/02/2012 0428   BILIRUBINUR NEGATIVE 11/02/2012 0428   KETONESUR NEGATIVE 11/02/2012 0428   PROTEINUR >300 (A) 11/02/2012 0428   UROBILINOGEN 0.2 11/02/2012 0428   NITRITE NEGATIVE 11/02/2012 0428   LEUKOCYTESUR NEGATIVE 11/02/2012 0428   Sepsis Labs: _2 (procalcitonin:4,lacticidven:4) ) Recent Results (from the past 240 hour(s))  Culture, blood (routine x 2)     Status: None (Preliminary result)   Collection Time: 10/08/15  8:17 PM  Result Value Ref Range Status   Specimen Description BLOOD RIGHT HAND  Final   Special Requests BOTTLES DRAWN AEROBIC AND ANAEROBIC 5CC EACH  Final   Culture NO GROWTH 4 DAYS  Final   Report Status PENDING  Incomplete  Culture, blood (routine x 2)     Status: None (Preliminary result)   Collection Time: 10/08/15  8:18 PM  Result Value Ref Range Status   Specimen Description BLOOD LEFT ANTECUBITAL  Final   Special Requests BOTTLES DRAWN AEROBIC AND ANAEROBIC 5CC EACH  Final   Culture NO GROWTH 4 DAYS  Final   Report Status PENDING  Incomplete  MRSA PCR Screening  Status: None   Collection Time: 10/09/15  1:12 AM  Result Value Ref Range Status   MRSA by PCR NEGATIVE NEGATIVE Final    Comment:        The GeneXpert MRSA Assay (FDA approved for NASAL specimens only), is one component of a comprehensive MRSA colonization surveillance program. It is not intended to diagnose MRSA infection nor to guide or monitor treatment for MRSA infections.   Aerobic Culture (superficial specimen)     Status: None   Collection Time: 10/09/15  2:53 PM  Result Value Ref Range Status   Specimen Description DRAINAGE HEEL  Final   Special Requests NONE  Final   Gram Stain   Final    FEW WBC PRESENT, PREDOMINANTLY PMN FEW GRAM POSITIVE COCCI IN PAIRS    Culture NORMAL SKIN  FLORA  Final   Report Status 10/12/2015 FINAL  Final     Scheduled Meds: . aspirin  81 mg Oral Daily  . calcium acetate  667 mg Oral TID WC  . darbepoetin (ARANESP) injection - DIALYSIS  100 mcg Intravenous Q Fri-HD  . doxycycline  100 mg Oral Q12H  . feeding supplement  1 Container Oral TID BM  . feeding supplement (PRO-STAT SUGAR FREE 64)  30 mL Oral BID  . gabapentin  300 mg Oral QPM  . levothyroxine  150 mcg Oral QAC breakfast  . lidocaine  1 patch Transdermal QHS  . metoprolol tartrate  12.5 mg Oral BID  . multivitamin  1 tablet Oral QHS  . senna-docusate  1 tablet Oral Daily  . tacrolimus  1 mg Oral QHS  . tacrolimus  2 mg Oral Daily  . warfarin  4 mg Oral ONCE-1800  . Warfarin - Pharmacist Dosing Inpatient   Does not apply q1800   Continuous Infusions: . heparin 800 Units/hr (10/13/15 0022)    Procedures/Studies: Dg Foot Complete Left  Result Date: 10/08/2015 CLINICAL DATA:  Left foot pain and swelling from ulcer to heel of foot, history diabetes, numbness and cold sensation darkening in color of toes, EXAM: LEFT FOOT - COMPLETE 3+ VIEW COMPARISON:  None. FINDINGS: No evidence of acute fracture or dislocation in the left foot. No focal bone erosion or cortical destruction to suggest osteomyelitis. Prominent plantar and Achilles spurs of the calcaneus. Mild degenerative changes in the interphalangeal joints and first metatarsal-phalangeal joint. Extensive vascular calcifications. IMPRESSION: No acute bony abnormalities. Electronically Signed   By: Lucienne Capers M.D.   On: 10/08/2015 21:30    Eliese Kerwood, DO  [facility] Pager [phone]  If 7PM-7AM, please contact night-coverage [email or web address] Password [email or web address] 10/13/2015, 3:21 PM   LOS: 4 days

## 2015-10-13 NOTE — Progress Notes (Signed)
ANTICOAGULATION CONSULT NOTE - Follow Up Consult  Pharmacy Consult for Heparin  Indication: h/o  AVR mechanical (on coumadin PTA)  Allergies  Allergen Reactions  . Acetaminophen Other (See Comments)    Liver transplant recipient   . Codeine Itching  . Mirtazapine Other (See Comments)    hallucination  . Morphine Itching and Other (See Comments)    hallucinate    Patient Measurements: Height: 5\' 4"  (162.6 cm) Weight: 121 lb 4.1 oz (55 kg) IBW/kg (Calculated) : 54.7  Vital Signs: Temp: 98.1 F (36.7 C) (09/02 0848) Temp Source: Oral (09/02 0848) BP: 136/71 (09/02 0848) Pulse Rate: 74 (09/02 0848)  Labs:  Recent Labs  10/11/15 0332 10/12/15 0649 10/13/15 0300  HGB 8.4* 8.1* 8.2*  HCT 25.6* 24.8* 25.1*  PLT 130* 154 155  LABPROT 19.2* 20.7* 19.9*  INR 1.60 1.75 1.68  HEPARINUNFRC 0.43 0.37 0.39  CREATININE  --  5.12*  --     Estimated Creatinine Clearance: 10 mL/min (by C-G formula based on SCr of 5.12 mg/dL).    Assessment: 60 y.o female, on a IV Heparin infusion while INR is subtherapeutic. Goal INR is 2.5-3.0 for h/o mechanical AVR.  -INR= 1.68 and heparin level= 0.39  Home coumadin dose: 2mg /d  Goal of Therapy:  INR 2.5-3.0 Heparin level 0.3-0.7 units/ml Monitor platelets by anticoagulation protocol: Yes     Plan:  Continue IV heparin 800 units/hr Coumadin 4 mg po today x1 Dialy Heparin level, INR, CBC  Hildred Laser, Pharm D 10/13/2015 11:38 AM

## 2015-10-14 DIAGNOSIS — I5032 Chronic diastolic (congestive) heart failure: Secondary | ICD-10-CM

## 2015-10-14 LAB — HEPARIN LEVEL (UNFRACTIONATED): Heparin Unfractionated: 0.35 IU/mL (ref 0.30–0.70)

## 2015-10-14 LAB — PROTIME-INR
INR: 1.77
Prothrombin Time: 20.9 seconds — ABNORMAL HIGH (ref 11.4–15.2)

## 2015-10-14 LAB — CBC
HEMATOCRIT: 26.1 % — AB (ref 36.0–46.0)
HEMOGLOBIN: 8.6 g/dL — AB (ref 12.0–15.0)
MCH: 29.7 pg (ref 26.0–34.0)
MCHC: 33 g/dL (ref 30.0–36.0)
MCV: 90 fL (ref 78.0–100.0)
Platelets: 205 10*3/uL (ref 150–400)
RBC: 2.9 MIL/uL — ABNORMAL LOW (ref 3.87–5.11)
RDW: 16.7 % — ABNORMAL HIGH (ref 11.5–15.5)
WBC: 13.4 10*3/uL — ABNORMAL HIGH (ref 4.0–10.5)

## 2015-10-14 MED ORDER — WARFARIN SODIUM 6 MG PO TABS
6.0000 mg | ORAL_TABLET | Freq: Once | ORAL | Status: AC
  Administered 2015-10-14: 6 mg via ORAL
  Filled 2015-10-14: qty 1

## 2015-10-14 NOTE — Progress Notes (Signed)
ANTICOAGULATION CONSULT NOTE - Follow Up Consult  Pharmacy Consult for Heparin  Indication: h/o  AVR mechanical (on coumadin PTA)  Allergies  Allergen Reactions  . Acetaminophen Other (See Comments)    Liver transplant recipient   . Codeine Itching  . Mirtazapine Other (See Comments)    hallucination  . Morphine Itching and Other (See Comments)    hallucinate    Patient Measurements: Height: 5\' 4"  (162.6 cm) Weight: 121 lb 4.1 oz (55 kg) IBW/kg (Calculated) : 54.7  Vital Signs: Temp: 98.1 F (36.7 C) (09/03 0909) Temp Source: Oral (09/03 0909) BP: 147/45 (09/03 0909) Pulse Rate: 71 (09/03 0909)  Labs:  Recent Labs  10/12/15 0649 10/13/15 0300 10/14/15 0442  HGB 8.1* 8.2* 8.6*  HCT 24.8* 25.1* 26.1*  PLT 154 155 205  LABPROT 20.7* 19.9* 20.9*  INR 1.75 1.68 1.77  HEPARINUNFRC 0.37 0.39 0.35  CREATININE 5.12*  --   --     Estimated Creatinine Clearance: 10 mL/min (by C-G formula based on SCr of 5.12 mg/dL).    Assessment: 60 y.o female, on a IV Heparin infusion while INR is subtherapeutic. Goal INR is 2.5-3.0 for h/o mechanical AVR. Minimal movement in INR -INR= 1.78 and heparin level= 0.35  Home coumadin dose: 2mg /d  Goal of Therapy:  INR 2.5-3.0 Heparin level 0.3-0.7 units/ml Monitor platelets by anticoagulation protocol: Yes     Plan:  Continue IV heparin 800 units/hr Coumadin 6 mg po today x1 Dialy Heparin level, INR, CBC  Hildred Laser, Pharm D 10/14/2015 1:13 PM

## 2015-10-14 NOTE — Progress Notes (Signed)
[facility] Progress Note   Dialysis Orders: [place] MWF 3:45 180 F 450/A 1.5x 3K/2.25 Ca Na model: linear EDW 51.5 kg LLE AVG No Heparin  Mircera last dose 75 on 8/2)  OP Labs: Hgb 9.9 K 3.7 Ca 8.8 P 4.4 iPTH 44 Alb 2.0- last tsat 53%  Assessment/Plan: 1. Left foot pain -off abtx improved , no LE DVT, ABI on LLE noncompressible 2. ESRD - MWF- next HD Monday plan first round 3. Anemia - hgb 8.6 s/p 100 Aranesp 9/1  Stable. Last outpt tsat was 53% 8/23- may just be ESA deficit - had been on Mircera 50 q 4 wks and prior to that last ESA dose was 75 on 8/2 4. Secondary hyperparathyroidism - Ca 8.6 P 6 - no VDRA - on phoslo 1 ac - no change in dosing for now - needs f/u after d/c 5. HTN/volume - BP ok this am; net UF 1.3 on Wed and 1971 on Friday with post weight 53.7 - still 2 kg above outpt EDW; lungs are clear; MTP 12.5 bid 6. Nutrition - alb 1.5 - severe PCM- supplements ordered 7. Hx AVR - on chronic coumadin/heparin per pharmacy; INR not therapeutic yet 1.77 Sunday 8. Hx liver transplant  Myriam Jacobson, PA-C [place] [phone] 10/14/2015,8:17 AM  LOS: 5 days    Renal Attending: I agree with the note above as articulated by PA Bergman. Tyrik Stetzer C   Subjective:   Frustrated with slow increase in INR  Objective Vitals:   10/13/15 0531 10/13/15 0848 10/13/15 1751 10/13/15 2151  BP: (!) 118/34 136/71 (!) 173/71 (!) 171/54  Pulse: 67 74 71 77  Resp: 18 17 16 16   Temp: 98.5 F (36.9 C) 98.1 F (36.7 C) 97.6 F (36.4 C) 98.8 F (37.1 C)  TempSrc: Oral Oral Oral Oral  SpO2: 100% 100% 100% 100%  Weight:      Height:       Physical Exam General: eating breakfast NAD Heart: RRR Lungs: no rales Abdomen: soft NT Extremities: no LE edema heel tender Dialysis Access: left thigh AVGG + bruit   Additional Objective Labs: Basic Metabolic Panel:  Recent Labs Lab 10/09/15 0848 10/10/15 0451 10/12/15 0649  NA 139 138 137   K 4.2 4.0 3.5  CL 105 102 102  CO2 30 27 28   GLUCOSE 117* 133* 122*  BUN 9 16 16   CREATININE 3.65* 4.54* 5.12*  CALCIUM 8.3* 8.4* 8.6*  PHOS  --   --  6.0*   Liver Function Tests:  Recent Labs Lab 10/12/15 0649  ALBUMIN 1.5*   No results for input(s): LIPASE, AMYLASE in the last 168 hours. CBC:  Recent Labs Lab 10/10/15 0451 10/11/15 0332 10/12/15 0649 10/13/15 0300 10/14/15 0442  WBC 7.5 7.8 8.2 9.0 13.4*  HGB 8.7* 8.4* 8.1* 8.2* 8.6*  HCT 27.0* 25.6* 24.8* 25.1* 26.1*  MCV 89.7 88.0 88.3 88.7 90.0  PLT 153 130* 154 155 205   Blood Culture    Component Value Date/Time   SDES DRAINAGE HEEL 10/09/2015 1453   SPECREQUEST NONE 10/09/2015 1453   CULT NORMAL SKIN FLORA 10/09/2015 1453   REPTSTATUS 10/12/2015 FINAL 10/09/2015 1453    Cardiac Enzymes: No results for input(s): CKTOTAL, CKMB, CKMBINDEX, TROPONINI in the last 168 hours. CBG:  Recent Labs Lab 10/10/15 2128 10/11/15 0827 10/11/15 1229 10/11/15 1649 10/11/15 2000  GLUCAP 152* 87 141* 136* 180*   Iron Studies: No results for input(s): IRON, TIBC, TRANSFERRIN, FERRITIN in the last  72 hours. Lab Results  Component Value Date   INR 1.77 10/14/2015   INR 1.68 10/13/2015   INR 1.75 10/12/2015   Studies/Results: No results found. Medications: . heparin 800 Units/hr (10/14/15 0416)   . aspirin  81 mg Oral Daily  . calcium acetate  667 mg Oral TID WC  . darbepoetin (ARANESP) injection - DIALYSIS  100 mcg Intravenous Q Fri-HD  . doxycycline  100 mg Oral Q12H  . feeding supplement  1 Container Oral TID BM  . feeding supplement (PRO-STAT SUGAR FREE 64)  30 mL Oral BID  . gabapentin  300 mg Oral QPM  . levothyroxine  150 mcg Oral QAC breakfast  . lidocaine  1 patch Transdermal QHS  . metoprolol tartrate  12.5 mg Oral BID  . multivitamin  1 tablet Oral QHS  . senna-docusate  1 tablet Oral Daily  . tacrolimus  1 mg Oral QHS  . tacrolimus  2 mg Oral Daily  . Warfarin - Pharmacist Dosing Inpatient    Does not apply [id number]

## 2015-10-14 NOTE — Progress Notes (Signed)
PROGRESS NOTE  [name] YKD:[id number] DOB: 1955-07-14 DOA: 10/08/2015 PCP: [name] Hipps, MD  Brief History: HPI on 10/08/15 by Dr. [name] [name] medical history significant of ESRD on HD(M/W/F), DM type 2, s/p Liver transplant,on chronic anticoagulation, CAD, hypothyroidism, PVD, COPD; who presents with complaints of progressively worsening foot pain over the last 4 weeks. Pain is located in the left heel and it is described assharp and stabbing in nature. Initially, symptoms were intermittent,but have become more constant and severe. Associated symptoms included swelling of the foot, more darkened appearance and discoloration of the toes on the left foot, foot is cold to the touch, and numbness sensation of the toes over the last week. Bearing weight on the affected foot worsens symptoms. She takes oxycodone with some relief of symptoms. Patient's daughter called her vascular surgeon's office 8/28and it was recommended for her to come to the hospital for further evaluation. 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  In July 2017,The patient presented with nausea vomiting and abdominal pain at [facility] CT of the abdomen and pelvis at that time revealed spike infarct with ring-enhancing collection of fluid. 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 Previous medical records reviewed and summarized  Assessment/Plan: Left foot pain--tip of calcaneus -mild calcaneal edema, cannot appreciate erythema or drainage -Foot xray of the left foot: No acute bony abnormalities, No foreign bodies  -Discontinue all intravenous antibiotics-->po doxy-->finish 6 days abx total -Suspect the patient has a resolving soft tissue injury as indicated by some ecchymosis on her calcaneus and tiny area of healing skin injury  -ESR 38, CRP  1.1 -lower extremity duplex--neg for DVT -Review of the notes from [facility] reveals that she has had chronic feet pain predating her splenectomy--they recommended pain management consultation -ABI on LLE--noncompressible secondary to graft  End-stage renal disease -Appreciate Nephrology -Patient dialyzes on Monday, Wednesday, Friday  Diabetes mellitus, type II -08/23/2015 hemoglobin A1c 4.2 -Discontinue CBGs, insulin sliding scale  -10/10/15 A1C--5.0  Essential hypertension -Continue metoprolol tartrate -BP has been labile  Aortic valve replacement (mechanical)/subtherapeutic INR -Patient had some bleeding issues recently however was restarted back on her Coumadin -Continue heparin GTT for bridging -unable to use lovenox due to ESRD  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 She denies any chest pain, shortness of breath, diarrhea, abdominal pain. Denies any hematochezia or melena.  Objective: Vitals:   10/13/15 0848 10/13/15 1751 10/13/15 2151 10/14/15 0909  BP: 136/71 (!) 173/71 (!) 171/54 (!) 147/45  Pulse: 74 71 77 71  Resp: _0 Temp: 98.1 F (36.7 C) 97.6 F (36.4 C) 98.8 F (37.1 C) 98.1 F (36.7 C)  TempSrc: Oral Oral Oral Oral  SpO2: 100% 100% 100% 100%  Weight:      Height:        Intake/Output Summary (Last 24 hours) at 10/14/15 1557 Last data filed at 10/14/15 1351  Gross per 24 hour  Intake  598.14 ml  Output                0 ml  Net           598.14 ml   Weight change:   Exam:   General:  Pt is alert, follows commands appropriately, not in acute distress  HEENT: No icterus, No thrush, No neck mass, [place]/AT  Cardiovascular: RRR, S1/S2, no rubs, no gallops  Respiratory: CTA bilaterally, no wheezing, no crackles, no rhonchi  Abdomen: Soft/+BS, non tender, non distended, no guarding  Extremities: No edema, No lymphangitis, No petechiae, No rashes, no synovitis   Data Reviewed: I have personally reviewed following labs and imaging studies Basic Metabolic Panel:  Recent Labs Lab 10/08/15 1540 10/09/15 0848 10/10/15 0451 10/12/15 0649  NA 138 139 138 137  K 3.9 4.2 4.0 3.5  CL 101 105 102 102  CO2 _0 GLUCOSE 170* 117* 133* 122*  BUN <5* _1 CREATININE 2.58* 3.65* 4.54* 5.12*  CALCIUM 8.6* 8.3* 8.4* 8.6*  PHOS  --   --   --  6.0*   Liver Function Tests:  Recent Labs Lab 10/12/15 0649  ALBUMIN 1.5*   No results for input(s): LIPASE, AMYLASE in the last 168 hours. No results for input(s): AMMONIA in the last 168 hours. Coagulation Profile:  Recent Labs Lab 10/10/15 0451 10/11/15 0332 10/12/15 0649 10/13/15 0300 10/14/15 0442  INR 1.38 1.60 1.75 1.68 1.77   CBC:  Recent Labs Lab 10/10/15 0451 10/11/15 0332 10/12/15 0649 10/13/15 0300 10/14/15 0442  WBC 7.5 7.8 8.2 9.0 13.4*  HGB 8.7* 8.4* 8.1* 8.2* 8.6*  HCT 27.0* 25.6* 24.8* 25.1* 26.1*  MCV 89.7 88.0 88.3 88.7 90.0  PLT 153 130* 154 155 205   Cardiac Enzymes: No results for input(s): CKTOTAL, CKMB, CKMBINDEX, TROPONINI in the last 168 hours. BNP: Invalid input(s): POCBNP CBG:  Recent Labs Lab 10/10/15 2128 10/11/15 0827 10/11/15 1229 10/11/15 1649 10/11/15 2000  GLUCAP 152* 87 141* 136* 180*   HbA1C: No results for input(s): HGBA1C in the last 72 hours. Urine analysis:    Component Value Date/Time   COLORURINE YELLOW 11/02/2012 0428   APPEARANCEUR CLOUDY (A) 11/02/2012 0428   LABSPEC 1.011 11/02/2012 0428   PHURINE 8.0 11/02/2012 0428    GLUCOSEU 250 (A) 11/02/2012 0428   HGBUR LARGE (A) 11/02/2012 0428   BILIRUBINUR NEGATIVE 11/02/2012 0428   KETONESUR NEGATIVE 11/02/2012 0428   PROTEINUR >300 (A) 11/02/2012 0428   UROBILINOGEN 0.2 11/02/2012 0428   NITRITE NEGATIVE 11/02/2012 0428   LEUKOCYTESUR NEGATIVE 11/02/2012 0428   Sepsis Labs: _2 (procalcitonin:4,lacticidven:4) ) Recent Results (from the past 240 hour(s))  Culture, blood (routine x 2)     Status: None   Collection Time: 10/08/15  8:17 PM  Result Value Ref Range Status   Specimen Description BLOOD RIGHT HAND  Final   Special Requests BOTTLES DRAWN AEROBIC AND ANAEROBIC 5CC EACH  Final   Culture NO GROWTH 5 DAYS  Final   Report Status 10/13/2015 FINAL  Final  Culture, blood (routine x 2)     Status: None   Collection Time: 10/08/15  8:18 PM  Result Value Ref Range Status   Specimen Description BLOOD LEFT ANTECUBITAL  Final   Special Requests BOTTLES DRAWN AEROBIC AND ANAEROBIC 5CC EACH  Final   Culture NO GROWTH 5 DAYS  Final   Report Status 10/13/2015 FINAL  Final  MRSA PCR Screening     Status: None  Collection Time: 10/09/15  1:12 AM  Result Value Ref Range Status   MRSA by PCR NEGATIVE NEGATIVE Final    Comment:        The GeneXpert MRSA Assay (FDA approved for NASAL specimens only), is one component of a comprehensive MRSA colonization surveillance program. It is not intended to diagnose MRSA infection nor to guide or monitor treatment for MRSA infections.   Aerobic Culture (superficial specimen)     Status: None   Collection Time: 10/09/15  2:53 PM  Result Value Ref Range Status   Specimen Description DRAINAGE HEEL  Final   Special Requests NONE  Final   Gram Stain   Final    FEW WBC PRESENT, PREDOMINANTLY PMN FEW GRAM POSITIVE COCCI IN PAIRS    Culture NORMAL SKIN FLORA  Final   Report Status 10/12/2015 FINAL  Final     Scheduled Meds: . aspirin  81 mg Oral Daily  . calcium acetate  667 mg Oral TID WC  . darbepoetin  (ARANESP) injection - DIALYSIS  100 mcg Intravenous Q Fri-HD  . doxycycline  100 mg Oral Q12H  . feeding supplement  1 Container Oral TID BM  . feeding supplement (PRO-STAT SUGAR FREE 64)  30 mL Oral BID  . gabapentin  300 mg Oral QPM  . levothyroxine  150 mcg Oral QAC breakfast  . lidocaine  1 patch Transdermal QHS  . metoprolol tartrate  12.5 mg Oral BID  . multivitamin  1 tablet Oral QHS  . senna-docusate  1 tablet Oral Daily  . tacrolimus  1 mg Oral QHS  . tacrolimus  2 mg Oral Daily  . warfarin  6 mg Oral ONCE-1800  . Warfarin - Pharmacist Dosing Inpatient   Does not apply q1800   Continuous Infusions: . heparin 800 Units/hr (10/14/15 0416)    Procedures/Studies: Dg Foot Complete Left  Result Date: 10/08/2015 CLINICAL DATA:  Left foot pain and swelling from ulcer to heel of foot, history diabetes, numbness and cold sensation darkening in color of toes, EXAM: LEFT FOOT - COMPLETE 3+ VIEW COMPARISON:  None. FINDINGS: No evidence of acute fracture or dislocation in the left foot. No focal bone erosion or cortical destruction to suggest osteomyelitis. Prominent plantar and Achilles spurs of the calcaneus. Mild degenerative changes in the interphalangeal joints and first metatarsal-phalangeal joint. Extensive vascular calcifications. IMPRESSION: No acute bony abnormalities. Electronically Signed   By: Lucienne Capers M.D.   On: 10/08/2015 21:30    Darene Nappi, DO  [facility] Pager [phone]  If 7PM-7AM, please contact night-coverage [email or web address] Password [email or web address] 10/14/2015, 3:57 PM   LOS: 5 days

## 2015-10-15 LAB — CBC
HCT: 26.6 % — ABNORMAL LOW (ref 36.0–46.0)
Hemoglobin: 8.5 g/dL — ABNORMAL LOW (ref 12.0–15.0)
MCH: 29.1 pg (ref 26.0–34.0)
MCHC: 32 g/dL (ref 30.0–36.0)
MCV: 91.1 fL (ref 78.0–100.0)
PLATELETS: 182 10*3/uL (ref 150–400)
RBC: 2.92 MIL/uL — ABNORMAL LOW (ref 3.87–5.11)
RDW: 17.4 % — AB (ref 11.5–15.5)
WBC: 10 10*3/uL (ref 4.0–10.5)

## 2015-10-15 LAB — HEPARIN LEVEL (UNFRACTIONATED)
HEPARIN UNFRACTIONATED: 0.24 [IU]/mL — AB (ref 0.30–0.70)
Heparin Unfractionated: 0.55 IU/mL (ref 0.30–0.70)

## 2015-10-15 LAB — PROTIME-INR
INR: 2.09
PROTHROMBIN TIME: 23.8 s — AB (ref 11.4–15.2)

## 2015-10-15 MED ORDER — WARFARIN SODIUM 4 MG PO TABS
4.0000 mg | ORAL_TABLET | Freq: Once | ORAL | Status: AC
  Administered 2015-10-15: 4 mg via ORAL
  Filled 2015-10-15: qty 1

## 2015-10-15 MED ORDER — HEPARIN BOLUS VIA INFUSION
800.0000 [IU] | Freq: Once | INTRAVENOUS | Status: AC
  Administered 2015-10-15: 800 [IU] via INTRAVENOUS
  Filled 2015-10-15: qty 800

## 2015-10-15 NOTE — Progress Notes (Signed)
ANTICOAGULATION CONSULT NOTE - Follow Up Consult  Pharmacy Consult for Heparin  Indication: h/o  AVR mechanical (on coumadin PTA)  Allergies  Allergen Reactions  . Acetaminophen Other (See Comments)    Liver transplant recipient   . Codeine Itching  . Mirtazapine Other (See Comments)    hallucination  . Morphine Itching and Other (See Comments)    hallucinate    Patient Measurements: Height: 5\' 4"  (162.6 cm) Weight: 121 lb 4.1 oz (55 kg) IBW/kg (Calculated) : 54.7  Vital Signs: Temp: 98.2 F (36.8 C) (09/04 0442) Temp Source: Oral (09/04 0442) BP: 104/48 (09/04 0442) Pulse Rate: 65 (09/04 0442)  Labs:  Recent Labs  10/13/15 0300 10/14/15 0442 10/15/15 0208  HGB 8.2* 8.6* 8.5*  HCT 25.1* 26.1* 26.6*  PLT 155 205 182  LABPROT 19.9* 20.9* 23.8*  INR 1.68 1.77 2.09  HEPARINUNFRC 0.39 0.35 0.24*    Estimated Creatinine Clearance: 10 mL/min (by C-G formula based on SCr of 5.12 mg/dL).    Assessment: 60 y.o female, on a IV Heparin infusion while INR is subtherapeutic. Goal INR is 2.5-3.0 for h/o mechanical AVR.  -HL = 0.24 this morning, which is subtherapeutic, INR 2.09 subtherapeutic   Home coumadin dose: 2mg /d  Goal of Therapy:  INR 2.5-3.0 Heparin level 0.3-0.7 units/ml Monitor platelets by anticoagulation protocol: Yes   Plan:  Heparin 800 units bolus x1 Increase IV heparin to 900 units/hr 1600 8-hour HL today Daily Heparin level, INR, CBC  Myer Peer Grayland Ormond), PharmD  PGY1 Pharmacy Resident Pager: [phone] 10/15/2015 7:29 AM

## 2015-10-15 NOTE — Progress Notes (Signed)
ANTICOAGULATION CONSULT NOTE - Follow Up Consult  Pharmacy Consult for Heparin  Indication: h/o  AVR mechanical (on coumadin PTA)  Allergies  Allergen Reactions  . Acetaminophen Other (See Comments)    Liver transplant recipient   . Codeine Itching  . Mirtazapine Other (See Comments)    hallucination  . Morphine Itching and Other (See Comments)    hallucinate    Patient Measurements: Height: 5\' 4"  (162.6 cm) Weight: 121 lb 4.1 oz (55 kg) IBW/kg (Calculated) : 54.7  Vital Signs: Temp: 98.1 F (36.7 C) (09/04 1618) Temp Source: Oral (09/04 1618) BP: 108/84 (09/04 1618) Pulse Rate: 69 (09/04 1618)  Labs:  Recent Labs  10/13/15 0300 10/14/15 0442 10/15/15 0208 10/15/15 1605  HGB 8.2* 8.6* 8.5*  --   HCT 25.1* 26.1* 26.6*  --   PLT 155 205 182  --   LABPROT 19.9* 20.9* 23.8*  --   INR 1.68 1.77 2.09  --   HEPARINUNFRC 0.39 0.35 0.24* 0.55    Estimated Creatinine Clearance: 10 mL/min (by C-G formula based on SCr of 5.12 mg/dL).    Assessment: 60 YOF on a IV Heparin infusion while INR is subtherapeutic. Goal INR is 2.5-3.0 for h/o mechanical AVR.   Heparin level low this morning, now in range at 0.55 units/mL after a rate increase. No bleeding noted. Hgb and plts stable.  Home coumadin dose: 2mg /d (admit INR was only 1.21) (her dosage has ranged from  2mg  daily to a 2mg /4mg  alternating regimen)  Goal of Therapy:  INR 2.5-3.0 Heparin level 0.3-0.7 units/ml Monitor platelets by anticoagulation protocol: Yes   Plan:  Continue Heparin at 900 units/hr Daily Heparin level, CBC  Zaidan Keeble D. Nelton Amsden, PharmD, BCPS Clinical Pharmacist Pager: [phone] 10/15/2015 4:50 PM

## 2015-10-15 NOTE — Progress Notes (Signed)
Late note:  Patient refusing Phoslo, Alric Seton notified on 10/14/15.

## 2015-10-15 NOTE — Progress Notes (Signed)
Got a call from secretary saying patient is at the ED, nurse tech found the her smoking, brought her back to the unit.  Explained about her smoking while on medications, and also leaving the premises, verbalized understanding and said she just went to get fresh air.  Dr. Carles Collet notified via text.

## 2015-10-15 NOTE — Progress Notes (Signed)
ANTICOAGULATION CONSULT NOTE - Follow Up Consult  Pharmacy Consult for Heparin  Indication: h/o  AVR mechanical (on coumadin PTA)  Allergies  Allergen Reactions  . Acetaminophen Other (See Comments)    Liver transplant recipient   . Codeine Itching  . Mirtazapine Other (See Comments)    hallucination  . Morphine Itching and Other (See Comments)    hallucinate    Patient Measurements: Height: 5\' 4"  (162.6 cm) Weight: 121 lb 4.1 oz (55 kg) IBW/kg (Calculated) : 54.7  Vital Signs: Temp: 98.3 F (36.8 C) (09/04 0750) Temp Source: Oral (09/04 0750) BP: 99/41 (09/04 0750) Pulse Rate: 60 (09/04 0750)  Labs:  Recent Labs  10/13/15 0300 10/14/15 0442 10/15/15 0208  HGB 8.2* 8.6* 8.5*  HCT 25.1* 26.1* 26.6*  PLT 155 205 182  LABPROT 19.9* 20.9* 23.8*  INR 1.68 1.77 2.09  HEPARINUNFRC 0.39 0.35 0.24*    Estimated Creatinine Clearance: 10 mL/min (by C-G formula based on SCr of 5.12 mg/dL).    Assessment: 60 y.o female, on a IV Heparin infusion while INR is subtherapeutic. Goal INR is 2.5-3.0 for h/o mechanical AVR.  -HL = 0.24 this morning, which is subtherapeutic, INR 2.09 subtherapeutic for h/o mechanical AVR.  Patient had some bleeding issues recently however was restarted back on her Coumadin  Home coumadin dose: 2mg /d (admit INR was only 1.21) (her dosage has ranged from  2mg  daily to a 2mg /4mg  alternating regimen)  Goal of Therapy:  INR 2.5-3.0 Heparin level 0.3-0.7 units/ml Monitor platelets by anticoagulation protocol: Yes   Plan:  Give coumadin 4mg  po today x1 Heparin IV rate was increased earlier this AM to 900 units/hr. We will f/u an 8-hour HL today-due at 16:00.  Daily Heparin level, INR, CBC  Nicole Cella, RPh Clinical Pharmacist Pager: [phone] 10/15/2015 1:49 PM

## 2015-10-15 NOTE — Progress Notes (Signed)
[facility] Progress Note    Dialysis Orders: [facility] MWF 3:45 180 F 450/A 1.5x 3K/2.25 Ca Na model: linear EDW 51.5 kg LLE AVG No Heparin Mircera last dose 75 on 8/2)  OP Labs: Hgb 9.9 K 3.7 Ca 8.8 P 4.4 iPTH 44 Alb 2.0- last tsat 53%  Assessment/Plan: 1. Left foot pain-off abx improved , no LE DVT, ABI on LLE noncompressible 2. ESRD- MWF- next HD  today 3. Anemia- hgb 8.5 s/p 100 Aranesp 9/1  Stable. Last outpt tsat was 53% 8/23- decline in hgb may just have been ESA deficit - had been on Mircera 50 q 4 wksand prior to that last ESA dose was 75 on 8/2 prior to admission 4. Secondary hyperparathyroidism- Ca 8.6 P 6 - no VDRA - on phoslo 1 ac - no change in dosing for now - needs f/u after d/c- repeat labs pre Hd today 5.HTN/volume- BP variable; net UF 1.3 on Wed and 1971 on Friday with post weight 53.7 - still 2 kg above outpt EDW; lungs are clear; MTP 12.5 bid 6. Nutrition- alb 1.5 - severe PCM- supplements ordered 7.Hx AVR - on chronic coumadin/heparin per pharmacy; INR up to 2.09 - waiting to be therapeutic for d/c 8. Hx liver transplant  Myriam Jacobson, PA-C [facility] Beeper [phone] 10/15/2015,8:41 AM  LOS: 6 days   Pt seen, examined and agree w A/P as above.  [name] MD [facility] pager [phone]    cell [phone] 10/15/2015, 3:47 PM    Subjective:   Frustrated with not being on HD yet and the fact that INR is not at goal yet  Objective Vitals:   10/14/15 1710 10/14/15 2152 10/15/15 0442 10/15/15 0750  BP: (!) 147/54 (!) 169/44 (!) 104/48 (!) 99/41  Pulse: 62 63 65 60  Resp: 16 16 16 17   Temp: 98.6 F (37 C) 99 F (37.2 C) 98.2 F (36.8 C) 98.3 F (36.8 C)  TempSrc: Oral Oral Oral Oral  SpO2: 100% 100% 100% 99%  Weight:      Height:       Physical Exam General: NAD Heart: RRR  Lungs: no rales Abdomen: soft NT Extremities: no sig LE edema; left heel min tenderness Dialysis Access: left  thigh + bruit   Additional Objective Labs: Basic Metabolic Panel:  Recent Labs Lab 10/09/15 0848 10/10/15 0451 10/12/15 0649  NA 139 138 137  K 4.2 4.0 3.5  CL 105 102 102  CO2 30 27 28   GLUCOSE 117* 133* 122*  BUN 9 16 16   CREATININE 3.65* 4.54* 5.12*  CALCIUM 8.3* 8.4* 8.6*  PHOS  --   --  6.0*   Liver Function Tests:  Recent Labs Lab 10/12/15 0649  ALBUMIN 1.5*   CBC:  Recent Labs Lab 10/11/15 0332 10/12/15 0649 10/13/15 0300 10/14/15 0442 10/15/15 0208  WBC 7.8 8.2 9.0 13.4* 10.0  HGB 8.4* 8.1* 8.2* 8.6* 8.5*  HCT 25.6* 24.8* 25.1* 26.1* 26.6*  MCV 88.0 88.3 88.7 90.0 91.1  PLT 130* 154 155 205 182   Blood Culture    Component Value Date/Time   SDES DRAINAGE HEEL 10/09/2015 1453   SPECREQUEST NONE 10/09/2015 1453   CULT NORMAL SKIN FLORA 10/09/2015 1453   REPTSTATUS 10/12/2015 FINAL 10/09/2015 1453   CBG:  Recent Labs Lab 10/10/15 2128 10/11/15 0827 10/11/15 1229 10/11/15 1649 10/11/15 2000  GLUCAP 152* 87 141* 136* 180*    Lab Results  Component Value Date   INR 2.09 10/15/2015  INR 1.77 10/14/2015   INR 1.68 10/13/2015   Studies/Results: No results found. Medications: . heparin 900 Units/hr (10/15/15 0720)   . aspirin  81 mg Oral Daily  . calcium acetate  667 mg Oral TID WC  . darbepoetin (ARANESP) injection - DIALYSIS  100 mcg Intravenous Q Fri-HD  . feeding supplement  1 Container Oral TID BM  . feeding supplement (PRO-STAT SUGAR FREE 64)  30 mL Oral BID  . gabapentin  300 mg Oral QPM  . levothyroxine  150 mcg Oral QAC breakfast  . lidocaine  1 patch Transdermal QHS  . metoprolol tartrate  12.5 mg Oral BID  . multivitamin  1 tablet Oral QHS  . senna-docusate  1 tablet Oral Daily  . tacrolimus  1 mg Oral QHS  . tacrolimus  2 mg Oral Daily  . Warfarin - Pharmacist Dosing Inpatient   Does not apply [phone]

## 2015-10-15 NOTE — Progress Notes (Signed)
PROGRESS NOTE  [name] OIT:[id number] DOB: 1955-06-16 DOA: 10/08/2015 PCP: [name] Hipps, MD Brief History: HPI on 10/08/15 by Dr. [name] [name] medical history significant of ESRD on HD(M/W/F), DM type 2, s/p Liver transplant,on chronic anticoagulation, CAD, hypothyroidism, PVD, COPD; who presents with complaints of progressively worsening foot pain over the last 4 weeks. Pain is located in the left heel and it is described assharp and stabbing in nature. Initially, symptoms were intermittent,but have become more constant and severe. Associated symptoms included swelling of the foot, more darkened appearance and discoloration of the toes on the left foot, foot is cold to the touch, and numbness sensation of the toes over the last week. Bearing weight on the affected foot worsens symptoms. She takes oxycodone with some relief of symptoms. Patient's daughter called her vascular surgeon's office 8/28and it was recommended for her to come to the hospital for further evaluation. 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 In July 2017,The patient presented with nausea vomiting and abdominal pain at [facility]. CT of the abdomen and pelvis at that time revealed spike infarct with ring-enhancing collection of fluid. 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 Previous medical records reviewed and summarized  Assessment/Plan: Left foot pain--tip of calcaneus -mild calcaneal edema, cannot appreciate erythema or drainage -Foot xray of the left foot: No acute bony abnormalities, No foreign bodies  -Discontinue all intravenous antibiotics-->po doxy-->finish 6 days abx total -Suspect the patient has a resolving soft tissue injury as indicated by some ecchymosis on her calcaneus and tiny area of healing skin injury  -ESR 38, CRP  1.1 -lower extremity duplex--neg for DVT -Review of the notes from [facility] reveals that she has had chronic feet pain predating her splenectomy--they recommended pain management consultation -ABI on LLE--noncompressible secondary to graft -9/4--pt sneaked outside to smoke a cigarett  End-stage renal disease -Appreciate Nephrology -Patient dialyzes on Monday, Wednesday, Friday  Diabetes mellitus, type II -08/23/2015 hemoglobin A1c 4.2 -Discontinue CBGs, insulin sliding scale  -10/10/15 A1C--5.0  Essential hypertension -Continue metoprolol tartrate -BP has been labile  Aortic valve replacement (mechanical)/subtherapeutic INR -Patient had some bleeding issues recently however was restarted back on her Coumadin -Continue heparin GTT for bridging -unable to use lovenox due to ESRD  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   Objective: Vitals:   10/14/15 2152 10/15/15 0442 10/15/15 0750 10/15/15 1618  BP: (!) 169/44 (!) 104/48 (!) 99/41 108/84  Pulse: 63 65 60 69  Resp: _0 Temp: 99 F (37.2 C) 98.2 F (36.8 C) 98.3 F (36.8 C) 98.1 F (36.7 C)  TempSrc: Oral Oral Oral Oral  SpO2: 100% 100% 99% 100%  Weight:      Height:        Intake/Output Summary (Last 24 hours) at 10/15/15 1829 Last data filed at 10/15/15 1400  Gross per 24 hour  Intake           917.86 ml  Output  0 ml  Net           917.86 ml   Weight change:   Exam:   General:  Pt is alert, follows commands appropriately, not in acute distress  HEENT: No icterus, No thrush, No neck mass, [place]/AT  Cardiovascular: RRR, S1/S2, no rubs, no gallops  Respiratory: CTA bilaterally, no wheezing, no crackles, no rhonchi  Abdomen: Soft/+BS, non tender, non distended, no guarding  Extremities: No edema, No lymphangitis, No petechiae, No rashes, no synovitis   Data Reviewed: I have personally reviewed following labs and imaging studies Basic Metabolic Panel:  Recent Labs Lab 10/09/15 0848 10/10/15 0451 10/12/15 0649  NA 139 138 137  K 4.2 4.0 3.5  CL 105 102 102  CO2 _0 GLUCOSE 117* 133* 122*  BUN _1 CREATININE 3.65* 4.54* 5.12*  CALCIUM 8.3* 8.4* 8.6*  PHOS  --   --  6.0*   Liver Function Tests:  Recent Labs Lab 10/12/15 0649  ALBUMIN 1.5*   No results for input(s): LIPASE, AMYLASE in the last 168 hours. No results for input(s): AMMONIA in the last 168 hours. Coagulation Profile:  Recent Labs Lab 10/11/15 0332 10/12/15 0649 10/13/15 0300 10/14/15 0442 10/15/15 0208  INR 1.60 1.75 1.68 1.77 2.09   CBC:  Recent Labs Lab 10/11/15 0332 10/12/15 0649 10/13/15 0300 10/14/15 0442 10/15/15 0208  WBC 7.8 8.2 9.0 13.4* 10.0  HGB 8.4* 8.1* 8.2* 8.6* 8.5*  HCT 25.6* 24.8* 25.1* 26.1* 26.6*  MCV 88.0 88.3 88.7 90.0 91.1  PLT 130* 154 155 205 182   Cardiac Enzymes: No results for input(s): CKTOTAL, CKMB, CKMBINDEX, TROPONINI in the last 168 hours. BNP: Invalid input(s): POCBNP CBG:  Recent Labs Lab 10/10/15 2128 10/11/15 0827 10/11/15 1229 10/11/15 1649 10/11/15 2000  GLUCAP 152* 87 141* 136* 180*   HbA1C: No results for input(s): HGBA1C in the last 72 hours. Urine analysis:    Component Value Date/Time   COLORURINE YELLOW 11/02/2012 0428   APPEARANCEUR CLOUDY (A) 11/02/2012 0428   LABSPEC 1.011 11/02/2012 0428   PHURINE 8.0 11/02/2012 0428   GLUCOSEU 250 (A) 11/02/2012 0428   HGBUR LARGE  (A) 11/02/2012 0428   BILIRUBINUR NEGATIVE 11/02/2012 0428   KETONESUR NEGATIVE 11/02/2012 0428   PROTEINUR >300 (A) 11/02/2012 0428   UROBILINOGEN 0.2 11/02/2012 0428   NITRITE NEGATIVE 11/02/2012 0428   LEUKOCYTESUR NEGATIVE 11/02/2012 0428   Sepsis Labs: _2 (procalcitonin:4,lacticidven:4) ) Recent Results (from the past 240 hour(s))  Culture, blood (routine x 2)     Status: None   Collection Time: 10/08/15  8:17 PM  Result Value Ref Range Status   Specimen Description BLOOD RIGHT HAND  Final   Special Requests BOTTLES DRAWN AEROBIC AND ANAEROBIC 5CC EACH  Final   Culture NO GROWTH 5 DAYS  Final   Report Status 10/13/2015 FINAL  Final  Culture, blood (routine x 2)     Status: None   Collection Time: 10/08/15  8:18 PM  Result Value Ref Range Status   Specimen Description BLOOD LEFT ANTECUBITAL  Final   Special Requests BOTTLES DRAWN AEROBIC AND ANAEROBIC 5CC EACH  Final   Culture NO GROWTH 5 DAYS  Final   Report Status 10/13/2015 FINAL  Final  MRSA PCR Screening     Status: None   Collection Time: 10/09/15  1:12 AM  Result Value Ref Range Status   MRSA by PCR NEGATIVE NEGATIVE Final    Comment:  The GeneXpert MRSA Assay (FDA approved for NASAL specimens only), is one component of a comprehensive MRSA colonization surveillance program. It is not intended to diagnose MRSA infection nor to guide or monitor treatment for MRSA infections.   Aerobic Culture (superficial specimen)     Status: None   Collection Time: 10/09/15  2:53 PM  Result Value Ref Range Status   Specimen Description DRAINAGE HEEL  Final   Special Requests NONE  Final   Gram Stain   Final    FEW WBC PRESENT, PREDOMINANTLY PMN FEW GRAM POSITIVE COCCI IN PAIRS    Culture NORMAL SKIN FLORA  Final   Report Status 10/12/2015 FINAL  Final     Scheduled Meds: . aspirin  81 mg Oral Daily  . calcium acetate  667 mg Oral TID WC  . darbepoetin (ARANESP) injection - DIALYSIS  100 mcg  Intravenous Q Fri-HD  . feeding supplement  1 Container Oral TID BM  . feeding supplement (PRO-STAT SUGAR FREE 64)  30 mL Oral BID  . gabapentin  300 mg Oral QPM  . levothyroxine  150 mcg Oral QAC breakfast  . lidocaine  1 patch Transdermal QHS  . metoprolol tartrate  12.5 mg Oral BID  . multivitamin  1 tablet Oral QHS  . senna-docusate  1 tablet Oral Daily  . tacrolimus  1 mg Oral QHS  . tacrolimus  2 mg Oral Daily  . warfarin  4 mg Oral ONCE-1800  . Warfarin - Pharmacist Dosing Inpatient   Does not apply q1800   Continuous Infusions: . heparin 900 Units/hr (10/15/15 1249)    Procedures/Studies: Dg Foot Complete Left  Result Date: 10/08/2015 CLINICAL DATA:  Left foot pain and swelling from ulcer to heel of foot, history diabetes, numbness and cold sensation darkening in color of toes, EXAM: LEFT FOOT - COMPLETE 3+ VIEW COMPARISON:  None. FINDINGS: No evidence of acute fracture or dislocation in the left foot. No focal bone erosion or cortical destruction to suggest osteomyelitis. Prominent plantar and Achilles spurs of the calcaneus. Mild degenerative changes in the interphalangeal joints and first metatarsal-phalangeal joint. Extensive vascular calcifications. IMPRESSION: No acute bony abnormalities. Electronically Signed   By: Lucienne Capers M.D.   On: 10/08/2015 21:30    Kadence Mimbs, DO  [facility] Pager [phone]  If 7PM-7AM, please contact night-coverage [email or web address] Password [email or web address] 10/15/2015, 6:29 PM   LOS: 6 days

## 2015-10-15 NOTE — Care Management (Signed)
10/15/2015 Notified by CSW of consult for assistance with meds. While assistance with meds is a CM role this pt does not qualify for assistance as she has both Medicare and a secondary insurance.  Jasmine Pang RN MPH, case manager, [phone]

## 2015-10-16 LAB — CBC
HEMATOCRIT: 24.7 % — AB (ref 36.0–46.0)
HEMOGLOBIN: 7.7 g/dL — AB (ref 12.0–15.0)
MCH: 28.6 pg (ref 26.0–34.0)
MCHC: 31.2 g/dL (ref 30.0–36.0)
MCV: 91.8 fL (ref 78.0–100.0)
Platelets: 272 10*3/uL (ref 150–400)
RBC: 2.69 MIL/uL — ABNORMAL LOW (ref 3.87–5.11)
RDW: 17.8 % — AB (ref 11.5–15.5)
WBC: 11.7 10*3/uL — ABNORMAL HIGH (ref 4.0–10.5)

## 2015-10-16 LAB — RENAL FUNCTION PANEL
Albumin: 1.6 g/dL — ABNORMAL LOW (ref 3.5–5.0)
Anion gap: 6 (ref 5–15)
BUN: 19 mg/dL (ref 6–20)
CO2: 27 mmol/L (ref 22–32)
Calcium: 8.8 mg/dL — ABNORMAL LOW (ref 8.9–10.3)
Chloride: 105 mmol/L (ref 101–111)
Creatinine, Ser: 7.3 mg/dL — ABNORMAL HIGH (ref 0.44–1.00)
GFR calc Af Amer: 6 mL/min — ABNORMAL LOW (ref >60)
GFR calc non Af Amer: 5 mL/min — ABNORMAL LOW (ref >60)
Glucose, Bld: 128 mg/dL — ABNORMAL HIGH (ref 65–99)
Phosphorus: 5.2 mg/dL — ABNORMAL HIGH (ref 2.5–4.6)
Potassium: 4 mmol/L (ref 3.5–5.1)
Sodium: 138 mmol/L (ref 135–145)

## 2015-10-16 LAB — PROTIME-INR
INR: 2.53
Prothrombin Time: 27.8 seconds — ABNORMAL HIGH (ref 11.4–15.2)

## 2015-10-16 LAB — HEPARIN LEVEL (UNFRACTIONATED): HEPARIN UNFRACTIONATED: 0.34 [IU]/mL (ref 0.30–0.70)

## 2015-10-16 MED ORDER — SODIUM CHLORIDE 0.9 % IV SOLN: 100.0000 mL | INTRAVENOUS | Status: DC | PRN

## 2015-10-16 MED ORDER — WARFARIN SODIUM 2 MG PO TABS
2.0000 mg | ORAL_TABLET | Freq: Once | ORAL | Status: DC
  Filled 2015-10-16: qty 1

## 2015-10-16 MED ORDER — ALTEPLASE 2 MG IJ SOLR
2.0000 mg | Freq: Once | INTRAMUSCULAR | Status: DC | PRN
Start: 2015-10-16 — End: 2015-10-16

## 2015-10-16 MED ORDER — BOOST / RESOURCE BREEZE PO LIQD: 1.0000 | Freq: Three times a day (TID) | ORAL | 0 refills | Status: DC

## 2015-10-16 MED ORDER — LIDOCAINE HCL (PF) 1 % IJ SOLN: 5.0000 mL | INTRAMUSCULAR | Status: DC | PRN

## 2015-10-16 MED ORDER — PENTAFLUOROPROP-TETRAFLUOROETH EX AERO: 1.0000 "application " | INHALATION_SPRAY | CUTANEOUS | Status: DC | PRN

## 2015-10-16 MED ORDER — PRO-STAT SUGAR FREE PO LIQD: 30.0000 mL | Freq: Two times a day (BID) | ORAL | 0 refills | Status: DC

## 2015-10-16 MED ORDER — LIDOCAINE-PRILOCAINE 2.5-2.5 % EX CREA: 1.0000 "application " | TOPICAL_CREAM | CUTANEOUS | Status: DC | PRN

## 2015-10-16 MED ORDER — RENA-VITE PO TABS: 1.0000 | ORAL_TABLET | Freq: Every day | ORAL | 1 refills | Status: DC

## 2015-10-16 MED ORDER — WARFARIN SODIUM 2 MG PO TABS: 4.0000 mg | ORAL_TABLET | Freq: Every day | ORAL | 0 refills | Status: DC

## 2015-10-16 MED ORDER — CALCIUM ACETATE (PHOS BINDER) 667 MG PO CAPS: 667.0000 mg | ORAL_CAPSULE | Freq: Three times a day (TID) | ORAL | 1 refills | Status: DC

## 2015-10-16 MED ORDER — WARFARIN SODIUM 2 MG PO TABS: 2.0000 mg | ORAL_TABLET | Freq: Every day | ORAL | 1 refills | Status: DC

## 2015-10-16 MED ORDER — HEPARIN SODIUM (PORCINE) 1000 UNIT/ML DIALYSIS: 1000.0000 [IU] | INTRAMUSCULAR | Status: DC | PRN

## 2015-10-16 NOTE — Progress Notes (Addendum)
ANTICOAGULATION CONSULT NOTE - Follow Up Consult  Pharmacy Consult for Heparin and coumadin Indication: h/o  AVR mechanical (on coumadin PTA)  Allergies  Allergen Reactions  . Acetaminophen Other (See Comments)    Liver transplant recipient   . Codeine Itching  . Mirtazapine Other (See Comments)    hallucination  . Morphine Itching and Other (See Comments)    hallucinate    Patient Measurements: Height: 5\' 4"  (162.6 cm) Weight: 118 lb 2.7 oz (53.6 kg) IBW/kg (Calculated) : 54.7  Vital Signs: Temp: 98.2 F (36.8 C) (09/05 1007) Temp Source: Oral (09/05 1007) BP: 127/41 (09/05 1007) Pulse Rate: 66 (09/05 1007)  Labs:  Recent Labs  10/14/15 0442 10/15/15 0208 10/15/15 1605 10/16/15 0528 10/16/15 0530  HGB 8.6* 8.5*  --  7.7*  --   HCT 26.1* 26.6*  --  24.7*  --   PLT 205 182  --  272  --   LABPROT 20.9* 23.8*  --   --  27.8*  INR 1.77 2.09  --   --  2.53  HEPARINUNFRC 0.35 0.24* 0.55  --  0.34  CREATININE  --   --   --  7.30*  --     Estimated Creatinine Clearance: 6.8 mL/min (by C-G formula based on SCr of 7.3 mg/dL).    Assessment: 60 YOF on a IV Heparin infusion while INR is subtherapeutic. Goal INR is 2.5-3.0 for h/o mechanical AVR.  INR is therapeutic today, INR = 2.53.  GOAL INR 2.5-3.0 for h/o mechanical AVR.  Heparin level therapeutic at 0.34 units/mL on 900 units/hr IV heparindrip. No bleeding noted. Hgb 8.5 > down to 7.7 today and pltc remains stable/wnl. Meals eaten has improved from ~ 50% of meals  to 80-100%.   PTA coumadin dose: 2mg /d (admit INR was only 1.21) (her outpatient dosage has ranged from  2mg  daily to a 2mg /4mg  alternating regimen in past several months)  Goal of Therapy:  INR 2.5-3.0 Heparin level 0.3-0.7 units/ml Monitor platelets by anticoagulation protocol: Yes   Plan:  Coumadin 2 mg po today x1.  At this point I would recommend coumadin 4mg  qMWFSun and 2mg  QTTSat for a discharg dose, with close INR follow up.  Continue  Heparin at 900 units/hr- Recommend to discontinue IV heparin today due to therapeutic INR for h/o mechanic AVR Daily Heparin level, CBC and INR  Nicole Cella, RPh Clinical Pharmacist Pager: [phone] 10/16/2015 11:31 AM   ADDENDUM:  Dr. Carles Collet has discontinued the IV heparin drip.  DC heparin levels and daily CBC.  I recommended discharge coumadin dose as noted above and to check INR by Thursday 9/7, or at latest on Friday 9/8- Dr. Carles Collet will inform pt's daughter of when INR check needed.   INR to continue per primary physician/coumadin clinic.  Nicole Cella, RPh Clinical Pharmacist Pager: [phone] 10/16/2015 12:12 PM

## 2015-10-16 NOTE — Discharge Summary (Signed)
Physician Discharge Summary  [name] ZES:[id number] DOB: 1955/08/29 DOA: 10/08/2015  PCP: [name], MD  Admit date: 10/08/2015 Discharge date: 10/16/2015  Admitted From: Home Disposition:  Home  Recommendations for Outpatient Follow-up:  1. Follow up with PCP in 1-2 weeks 2. Please obtain CBC and INR on 10/19/15 on HD and adjust coumadin dose for INR 2.5 to 3  Home Health: NO Equipment/Devices: None  Discharge Condition:stable CODE STATUS: FULL Diet recommendation: Heart Healthy   Brief/Interim Summary: [name] Covin [name] medical history significant of ESRD on HD(M/W/F), DM type 2, s/p Liver transplant,on chronic anticoagulation, CAD, hypothyroidism, PVD, COPD; who presents with complaints of progressively worsening foot pain over the last 4 weeks. Pain is located in the left heel and it is described assharp and stabbing in nature. Initially, symptoms were intermittent,but have become more constant and severe. Associated symptoms included swelling of the foot, more darkened appearance and discoloration of the toes on the left foot, foot is cold to the touch, and numbness sensation of the toes over the last week. Bearing weight on the affected foot worsens symptoms. She takes oxycodone with some relief of symptoms. Patient's daughter called her vascular surgeon's office 8/28and it was recommended for her to come to the hospital for further evaluation. 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 In July 2017,The patient presented with nausea vomiting and abdominal pain at [facility]. CT of the abdomen and pelvis at that time revealed spike infarct with ring-enhancing collection of fluid. 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  Discharge Diagnoses:  Left foot pain--tip of calcaneus -mild calcaneal edema, cannot appreciate erythema or  drainage -Foot xray of the left foot: No acute bony abnormalities, No foreign bodies  -Discontinue all intravenous antibiotics-->po doxy-->finish 6 days abx total -Suspect the patient has a resolving soft tissue injury as indicated by some ecchymosis on her calcaneus and tiny area of healing skin injury  -ESR 38, CRP 1.1 -lower extremity duplex--neg for DVT -Review of the notes from [facility] reveals that she has had chronic feet pain predating her splenectomy--they recommended pain management consultation -ABI on LLE--noncompressible secondary to graft -overall improved without pain and able to ambulate without difficulty -9/4--pt sneaked outside to smoke a cigarette  End-stage renal disease -Appreciate Nephrology -Patient dialyzes on Monday, Wednesday, Friday  Diabetes mellitus, type II -08/23/2015 hemoglobin A1c 4.2 -Discontinue CBGs, insulin sliding scale  -10/10/15 A1C--5.0  Essential hypertension -Continue metoprolol tartrate -BP has been labile  Aortic valve replacement (mechanical)/subtherapeutic INR -Patient had some bleeding issues recently however was restarted back on her Coumadin -Continue heparin GTT for bridging -unable to use lovenox due to ESRD -INR 2.53 on day of dc -home with coumadin 2 mg on Tues-Thur-Sat -home with coumadin 4 mg on Mon-Wed-Fri-Sun -check INR on 9/8 and adjust coumadin for INR 2.5-3  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 Multiple ED visits   Diagnoses of:  Kidney Failure   Expected date of contact:  1-3 days (reserved for hospital discharges)   Please assign to community nurse for transition of care calls and assess for home visits. PLEASE NOTE:  Patient has HD  on MWF and prefers  follow up on odd days.  Thanks,. Questions please call:   Natividad Brood, RN BSN [facility] Liaison  [phone] business mobile phone Toll free office [phone]   Diet - low sodium heart healthy    Complete by:  As directed   Increase activity slowly    Complete by:  As directed       Medication List    STOP taking these medications   ipratropium-albuterol 0.5-2.5 (3) MG/3ML Soln Commonly known as:  DUONEB   oxyCODONE 5 MG immediate release tablet Commonly known as:  ROXICODONE   traMADol 50 MG tablet Commonly known as:  ULTRAM   verapamil 120 MG tablet Commonly known as:  CALAN     TAKE these medications   aspirin EC 81 MG tablet Take 81 mg by mouth daily.   calcium acetate 667 MG capsule Commonly known as:  PHOSLO Take 1 capsule (667 mg total) by mouth 3 (three) times daily with meals.   feeding supplement (PRO-STAT SUGAR FREE 64) Liqd Take 30 mLs by mouth 2 (two) times daily.   feeding supplement Liqd Take 1 Container by mouth 3 (three) times daily between meals.   gabapentin 300 MG capsule Commonly known as:  NEURONTIN Take 300 mg by mouth every evening.   insulin aspart 100 UNIT/ML injection Commonly known as:  novoLOG Inject 1-2 Units into the skin 3 (three) times daily as needed for high blood sugar (CBG>150). CBG < 150 = no insulin CBG 150-200 = 1 unit CBG 200-250 = 2 units   levothyroxine 150 MCG tablet Commonly known as:  SYNTHROID, LEVOTHROID Take 150 mcg by mouth daily before breakfast.   lidocaine 5 % Commonly known as:  LIDODERM Place 1 patch onto the skin daily. Remove & Discard patch within 12 hours or as directed by MD   LORazepam 0.5 MG tablet Commonly known as:  ATIVAN Take 0.5 mg by mouth every 6 (six) hours as needed (for nausea and vomiting).   metoprolol tartrate 25 MG tablet Commonly known as:  LOPRESSOR Take 12.5 mg by mouth 2 (two) times daily.   multivitamin Tabs tablet Take 1 tablet by mouth at  bedtime.   nitroGLYCERIN 0.4 MG SL tablet Commonly known as:  NITROSTAT Place 1 tablet (0.4 mg total) under the tongue every 5 (five) minutes as needed for chest pain.   ondansetron 4 MG disintegrating tablet Commonly known as:  ZOFRAN ODT Take 1 tablet (4 mg total) by mouth every 8 (eight) hours as needed for nausea. What changed:  Another medication with the same name was removed. Continue taking this medication, and follow the directions you see here.   senna-docusate 8.6-50 MG tablet Commonly known as:  Senokot-S Take 1 tablet by mouth daily.   tacrolimus 1 MG capsule Commonly known as:  PROGRAF Take 1-2 mg by mouth 2 (two) times daily.   warfarin 2 MG tablet Commonly known as:  COUMADIN Take 1 tablet (2 mg total) by mouth daily. On Tues-Thurs-Sat What changed:  additional instructions   warfarin 2 MG tablet Commonly known as:  COUMADIN Take 2 tablets (4 mg total) by mouth daily. On Mon-Wed-Fri-Sun What changed:  You were already taking a medication with the same name, and this prescription was added. Make sure you understand how and when to take each.       Allergies  Allergen Reactions  . Acetaminophen Other (See Comments)    Liver transplant recipient   . Codeine Itching  .  Mirtazapine Other (See Comments)    hallucination  . Morphine Itching and Other (See Comments)    hallucinate    Consultations:  nephrology   Procedures/Studies: Dg Foot Complete Left  Result Date: 10/08/2015 CLINICAL DATA:  Left foot pain and swelling from ulcer to heel of foot, history diabetes, numbness and cold sensation darkening in color of toes, EXAM: LEFT FOOT - COMPLETE 3+ VIEW COMPARISON:  None. FINDINGS: No evidence of acute fracture or dislocation in the left foot. No focal bone erosion or cortical destruction to suggest osteomyelitis. Prominent plantar and Achilles spurs of the calcaneus. Mild degenerative changes in the interphalangeal joints and first metatarsal-phalangeal  joint. Extensive vascular calcifications. IMPRESSION: No acute bony abnormalities. Electronically Signed   By: Lucienne Capers M.D.   On: 10/08/2015 21:30        Discharge Exam: Vitals:   10/16/15 0851 10/16/15 1007  BP: (!) 130/59 (!) 127/41  Pulse: 70 66  Resp: 16 16  Temp: 98.3 F (36.8 C) 98.2 F (36.8 C)   Vitals:   10/16/15 0800 10/16/15 0830 10/16/15 0851 10/16/15 1007  BP: (!) 113/55 (!) 120/42 (!) 130/59 (!) 127/41  Pulse: 70 72 70 66  Resp:   16 16  Temp:   98.3 F (36.8 C) 98.2 F (36.8 C)  TempSrc:   Oral Oral  SpO2:    100%  Weight:   53.6 kg (118 lb 2.7 oz)   Height:        General: Pt is alert, awake, not in acute distress Cardiovascular: RRR, S1/S2 +, no rubs, no gallops Respiratory: CTA bilaterally, no wheezing, no rhonchi Abdominal: Soft, NT, ND, bowel sounds + Extremities: no edema, no cyanosis   The results of significant diagnostics from this hospitalization (including imaging, microbiology, ancillary and laboratory) are listed below for reference.    Significant Diagnostic Studies: Dg Foot Complete Left  Result Date: 10/08/2015 CLINICAL DATA:  Left foot pain and swelling from ulcer to heel of foot, history diabetes, numbness and cold sensation darkening in color of toes, EXAM: LEFT FOOT - COMPLETE 3+ VIEW COMPARISON:  None. FINDINGS: No evidence of acute fracture or dislocation in the left foot. No focal bone erosion or cortical destruction to suggest osteomyelitis. Prominent plantar and Achilles spurs of the calcaneus. Mild degenerative changes in the interphalangeal joints and first metatarsal-phalangeal joint. Extensive vascular calcifications. IMPRESSION: No acute bony abnormalities. Electronically Signed   By: Lucienne Capers M.D.   On: 10/08/2015 21:30     Microbiology: Recent Results (from the past 240 hour(s))  Culture, blood (routine x 2)     Status: None   Collection Time: 10/08/15  8:17 PM  Result Value Ref Range Status   Specimen  Description BLOOD RIGHT HAND  Final   Special Requests BOTTLES DRAWN AEROBIC AND ANAEROBIC 5CC EACH  Final   Culture NO GROWTH 5 DAYS  Final   Report Status 10/13/2015 FINAL  Final  Culture, blood (routine x 2)     Status: None   Collection Time: 10/08/15  8:18 PM  Result Value Ref Range Status   Specimen Description BLOOD LEFT ANTECUBITAL  Final   Special Requests BOTTLES DRAWN AEROBIC AND ANAEROBIC 5CC EACH  Final   Culture NO GROWTH 5 DAYS  Final   Report Status 10/13/2015 FINAL  Final  MRSA PCR Screening     Status: None   Collection Time: 10/09/15  1:12 AM  Result Value Ref Range Status   MRSA by PCR NEGATIVE NEGATIVE Final  Comment:        The GeneXpert MRSA Assay (FDA approved for NASAL specimens only), is one component of a comprehensive MRSA colonization surveillance program. It is not intended to diagnose MRSA infection nor to guide or monitor treatment for MRSA infections.   Aerobic Culture (superficial specimen)     Status: None   Collection Time: 10/09/15  2:53 PM  Result Value Ref Range Status   Specimen Description DRAINAGE HEEL  Final   Special Requests NONE  Final   Gram Stain   Final    FEW WBC PRESENT, PREDOMINANTLY PMN FEW GRAM POSITIVE COCCI IN PAIRS    Culture NORMAL SKIN FLORA  Final   Report Status 10/12/2015 FINAL  Final     Labs: Basic Metabolic Panel:  Recent Labs Lab 10/10/15 0451 10/12/15 0649 10/16/15 0528  NA 138 137 138  K 4.0 3.5 4.0  CL 102 102 105  CO2 _0 GLUCOSE 133* 122* 128*  BUN _1 CREATININE 4.54* 5.12* 7.30*  CALCIUM 8.4* 8.6* 8.8*  PHOS  --  6.0* 5.2*   Liver Function Tests:  Recent Labs Lab 10/12/15 0649 10/16/15 0528  ALBUMIN 1.5* 1.6*   No results for input(s): LIPASE, AMYLASE in the last 168 hours. No results for input(s): AMMONIA in the last 168 hours. CBC:  Recent Labs Lab 10/12/15 0649 10/13/15 0300 10/14/15 0442 10/15/15 0208 10/16/15 0528  WBC 8.2 9.0 13.4* 10.0 11.7*  HGB  8.1* 8.2* 8.6* 8.5* 7.7*  HCT 24.8* 25.1* 26.1* 26.6* 24.7*  MCV 88.3 88.7 90.0 91.1 91.8  PLT 154 155 205 182 272   Cardiac Enzymes: No results for input(s): CKTOTAL, CKMB, CKMBINDEX, TROPONINI in the last 168 hours. BNP: Invalid input(s): POCBNP CBG:  Recent Labs Lab 10/10/15 2128 10/11/15 0827 10/11/15 1229 10/11/15 1649 10/11/15 2000  GLUCAP 152* 87 141* 136* 180*    Time coordinating discharge:  Greater than 30 minutes  Signed:  Amiria Orrison, DO [facility] Pager: [phone] 10/16/2015, 1:37 PM

## 2015-10-16 NOTE — Progress Notes (Signed)
[facility] Progress Note  Assessment/Plan: 1.  Left foot pain/ cellulitis-off abx improved , no LE DVT, ABI on LLE noncompressible 2. ESRD- MWF- HD today - off schedule today  K+ 4.0  3. Anemia- Hgb 7.7 down from 8.5 s/p 100 Aranesp 9/1  Last outpt tsat was 53% 8/23 4. Secondary hyperparathyroidism- Ca 8.6 P 6 - no VDRA - on phoslo 1 ac - no change in dosing for now - needs f/u after d/c- repeat labs pre HD today  5.HTN/volume- BP variable;  UF goal 3.5L today BP 109/53 net UF  1971 on Friday with post weight 53.7 - still 2 kg above outpt EDW; lungs are clear; MTP 12.5 bid 6. Nutrition- alb 1.6 - severe PCM- supplements ordered 7.Hx AVR - on chronic coumadin/heparin per pharmacy; INR 2.53 today  8. Hx liver transplant 9. Dispo - ok for dc today from our standpoint  [name] PA-C [place] 10/16/2015,9:00 AM  LOS: 7 days   Pt seen, examined and agree w A/P as above.  [name] MD [facility] pager [phone]    cell [phone] 10/16/2015, 2:01 PM    Subjective:   INR up to 2.5  Objective Vitals:   10/16/15 0753 10/16/15 0800 10/16/15 0830 10/16/15 0851  BP: (!) 83/41 (!) 113/55 (!) 120/42 (!) 130/59  Pulse: 81 70 72 70  Resp:    16  Temp:    98.3 F (36.8 C)  TempSrc:    Oral  SpO2:      Weight:      Height:       Physical Exam General: WNWD elderly female NAD Heart: RRR S1 S2  Lungs: CTAB  Abdomen: soft NT ND Extremities: no LE edema - L foot swelling improved  Dialysis Access: L thigh graft - cannulated   Dialysis Orders: [place] MWF 3:45 180 F 450/A 1.5x 3K/2.25 Ca Na model: linear EDW 51.5 kg LLE AVG No Heparin Mircera last dose 75 on 8/2)  OP Labs: Hgb 9.9 K 3.7 Ca 8.8 P 4.4 iPTH 44 Alb 2.0- last tsat 53%  Additional Objective Labs: Basic Metabolic Panel:  Recent Labs Lab 10/10/15 0451 10/12/15 0649 10/16/15 0528  NA 138 137 138  K 4.0 3.5 4.0  CL 102 102 105  CO2 27 28 27    GLUCOSE 133* 122* 128*  BUN 16 16 19   CREATININE 4.54* 5.12* 7.30*  CALCIUM 8.4* 8.6* 8.8*  PHOS  --  6.0* 5.2*   Liver Function Tests:  Recent Labs Lab 10/12/15 0649 10/16/15 0528  ALBUMIN 1.5* 1.6*   No results for input(s): LIPASE, AMYLASE in the last 168 hours. CBC:  Recent Labs Lab 10/12/15 0649 10/13/15 0300 10/14/15 0442 10/15/15 0208 10/16/15 0528  WBC 8.2 9.0 13.4* 10.0 11.7*  HGB 8.1* 8.2* 8.6* 8.5* 7.7*  HCT 24.8* 25.1* 26.1* 26.6* 24.7*  MCV 88.3 88.7 90.0 91.1 91.8  PLT 154 155 205 182 272   Blood Culture    Component Value Date/Time   SDES DRAINAGE HEEL 10/09/2015 1453   SPECREQUEST NONE 10/09/2015 1453   CULT NORMAL SKIN FLORA 10/09/2015 1453   REPTSTATUS 10/12/2015 FINAL 10/09/2015 1453    Cardiac Enzymes: No results for input(s): CKTOTAL, CKMB, CKMBINDEX, TROPONINI in the last 168 hours. CBG:  Recent Labs Lab 10/10/15 2128 10/11/15 0827 10/11/15 1229 10/11/15 1649 10/11/15 2000  GLUCAP 152* 87 141* 136* 180*   Iron Studies: No results for input(s): IRON, TIBC, TRANSFERRIN, FERRITIN in the last 72 hours.  Lab Results  Component Value Date   INR 2.53 10/16/2015   INR 2.09 10/15/2015   INR 1.77 10/14/2015   Studies/Results: No results found. Medications: . heparin 900 Units/hr (10/15/15 1249)   . aspirin  81 mg Oral Daily  . calcium acetate  667 mg Oral TID WC  . darbepoetin (ARANESP) injection - DIALYSIS  100 mcg Intravenous Q Fri-HD  . feeding supplement  1 Container Oral TID BM  . feeding supplement (PRO-STAT SUGAR FREE 64)  30 mL Oral BID  . gabapentin  300 mg Oral QPM  . levothyroxine  150 mcg Oral QAC breakfast  . lidocaine  1 patch Transdermal QHS  . metoprolol tartrate  12.5 mg Oral BID  . multivitamin  1 tablet Oral QHS  . senna-docusate  1 tablet Oral Daily  . tacrolimus  1 mg Oral QHS  . tacrolimus  2 mg Oral Daily  . Warfarin - Pharmacist Dosing Inpatient   Does not apply [phone]

## 2015-10-16 NOTE — Progress Notes (Signed)
Patient is now going to HD. RN held medications for the night waiting for patient to go to HD and return. RN will not be able to administer medications.   Ermalinda Memos, RN

## 2015-10-16 NOTE — Progress Notes (Signed)
Discharge instructions, follow up appointments and home care instructions reviewed with the patient.  Medications and prescriptions reviewed with the patient as well as discontinued medications.  Written material provided.  Patient voices understanding to teaching.  To door via wheelchair.  Home via [place] with family driving.

## 2015-10-17 DIAGNOSIS — D631 Anemia in chronic kidney disease: Secondary | ICD-10-CM | POA: Diagnosis not present

## 2015-10-17 DIAGNOSIS — N186 End stage renal disease: Secondary | ICD-10-CM | POA: Diagnosis not present

## 2015-10-17 DIAGNOSIS — E876 Hypokalemia: Secondary | ICD-10-CM | POA: Diagnosis not present

## 2015-10-17 DIAGNOSIS — E118 Type 2 diabetes mellitus with unspecified complications: Secondary | ICD-10-CM | POA: Diagnosis not present

## 2015-10-17 DIAGNOSIS — I359 Nonrheumatic aortic valve disorder, unspecified: Secondary | ICD-10-CM | POA: Diagnosis not present

## 2015-10-17 LAB — POCT INR: INR: 2.21

## 2015-10-19 ENCOUNTER — Other Ambulatory Visit: Payer: Self-pay

## 2015-10-19 ENCOUNTER — Ambulatory Visit (INDEPENDENT_AMBULATORY_CARE_PROVIDER_SITE_OTHER): Payer: Medicare Other | Admitting: Pharmacist

## 2015-10-19 DIAGNOSIS — Z954 Presence of other heart-valve replacement: Secondary | ICD-10-CM

## 2015-10-19 DIAGNOSIS — I359 Nonrheumatic aortic valve disorder, unspecified: Secondary | ICD-10-CM | POA: Diagnosis not present

## 2015-10-19 DIAGNOSIS — Z952 Presence of prosthetic heart valve: Secondary | ICD-10-CM

## 2015-10-19 DIAGNOSIS — E876 Hypokalemia: Secondary | ICD-10-CM | POA: Diagnosis not present

## 2015-10-19 DIAGNOSIS — Z7901 Long term (current) use of anticoagulants: Secondary | ICD-10-CM

## 2015-10-19 DIAGNOSIS — E118 Type 2 diabetes mellitus with unspecified complications: Secondary | ICD-10-CM | POA: Diagnosis not present

## 2015-10-19 DIAGNOSIS — N186 End stage renal disease: Secondary | ICD-10-CM | POA: Diagnosis not present

## 2015-10-19 DIAGNOSIS — D631 Anemia in chronic kidney disease: Secondary | ICD-10-CM | POA: Diagnosis not present

## 2015-10-19 LAB — POCT INR: INR: 2.6

## 2015-10-19 NOTE — Patient Outreach (Signed)
Transition of care call: Placed call to patient who reports that she just finished dialysis. States that she is exhausted and doe snot feel like talking on the phone. Reports that she has dialysis MWF and her daughter takes her to dialysis. Reports that she has a visit planned with the [facility] on Tuesday 10/23/2015.  Reports to me that she does not feel like talking on the phone but states that she has all her medications and is taking them as prescribed. States that her CBG today at dialysis was 109.  Reports that her INR was checked today and is "good" at 2.5.  Patient reports her main concern is the pain in her left foot. Patient reports that her dialysis access is in her left thigh.  PLAN: Patient agreed to a phone call follow up on 10/25/2015.  Provided my contact information for patient to call me if needed.  Will plan to outreach patient weekly.  Will notify MD of involvement in [facility] transition of care program.  Goal setting during phone call. Patient wants to avoid readmissions.  [facility] CM Care Plan Problem One   Flowsheet Row Most Recent Value  Care Plan Problem One  Recent hospital admission related to left foot pain.  Role Documenting the Problem One  Care Management [place] for Problem One  Active  THN Long Term Goal (31-90 days)  Patient will report no readmissions in the next 31 days.  THN Long Term Goal Start Date  10/19/15  Interventions for Problem One Long Term Goal  Reviewed transition of care program. Provided my contact information. Reviewed discharge instructions  THN CM Short Term Goal #1 (0-30 days)  Patient will report follow up with vein specialist in 1 week  THN CM Short Term Goal #1 Start Date  10/19/15  Interventions for Short Term Goal #1  reviewed importance of follow up.   THN CM Short Term Goal #2 (0-30 days)  Patient will report follow up with primary MD in the next 14 days  THN CM Short Term Goal #2 Start Date  10/19/15  Interventions for  Short Term Goal #2  Reviewed importance of timely follow up with primary MD. Encouraged patient to make an appointment.       [name], RN, BSN, CEN [facility] [employer] [phone]

## 2015-10-22 ENCOUNTER — Ambulatory Visit (INDEPENDENT_AMBULATORY_CARE_PROVIDER_SITE_OTHER): Payer: Medicare Other | Admitting: Pharmacist

## 2015-10-22 DIAGNOSIS — N186 End stage renal disease: Secondary | ICD-10-CM | POA: Diagnosis not present

## 2015-10-22 DIAGNOSIS — E118 Type 2 diabetes mellitus with unspecified complications: Secondary | ICD-10-CM | POA: Diagnosis not present

## 2015-10-22 DIAGNOSIS — Z952 Presence of prosthetic heart valve: Secondary | ICD-10-CM

## 2015-10-22 DIAGNOSIS — Z954 Presence of other heart-valve replacement: Secondary | ICD-10-CM

## 2015-10-22 DIAGNOSIS — Z7901 Long term (current) use of anticoagulants: Secondary | ICD-10-CM

## 2015-10-22 DIAGNOSIS — E876 Hypokalemia: Secondary | ICD-10-CM | POA: Diagnosis not present

## 2015-10-22 DIAGNOSIS — D631 Anemia in chronic kidney disease: Secondary | ICD-10-CM | POA: Diagnosis not present

## 2015-10-23 ENCOUNTER — Other Ambulatory Visit: Payer: Self-pay | Admitting: Vascular Surgery

## 2015-10-23 ENCOUNTER — Ambulatory Visit (HOSPITAL_COMMUNITY)
Admission: RE | Admit: 2015-10-23 | Discharge: 2015-10-23 | Disposition: A | Discharge status: Home / Self Care | Payer: Medicare Other | Source: Ambulatory Visit | Attending: Vascular Surgery | Admitting: Vascular Surgery

## 2015-10-23 DIAGNOSIS — I7389 Other specified peripheral vascular diseases: Secondary | ICD-10-CM | POA: Diagnosis not present

## 2015-10-23 DIAGNOSIS — R230 Cyanosis: Secondary | ICD-10-CM | POA: Insufficient documentation

## 2015-10-23 DIAGNOSIS — R0989 Other specified symptoms and signs involving the circulatory and respiratory systems: Secondary | ICD-10-CM | POA: Diagnosis present

## 2015-10-24 DIAGNOSIS — N186 End stage renal disease: Secondary | ICD-10-CM | POA: Diagnosis not present

## 2015-10-24 DIAGNOSIS — E118 Type 2 diabetes mellitus with unspecified complications: Secondary | ICD-10-CM | POA: Diagnosis not present

## 2015-10-24 DIAGNOSIS — D631 Anemia in chronic kidney disease: Secondary | ICD-10-CM | POA: Diagnosis not present

## 2015-10-24 DIAGNOSIS — E876 Hypokalemia: Secondary | ICD-10-CM | POA: Diagnosis not present

## 2015-10-24 DIAGNOSIS — I359 Nonrheumatic aortic valve disorder, unspecified: Secondary | ICD-10-CM | POA: Diagnosis not present

## 2015-10-24 LAB — PROTIME-INR: INR: 2.4 — AB (ref 0.9–1.1)

## 2015-10-25 ENCOUNTER — Telehealth: Payer: Self-pay

## 2015-10-25 ENCOUNTER — Encounter: Payer: Self-pay | Admitting: Vascular Surgery

## 2015-10-25 ENCOUNTER — Other Ambulatory Visit: Payer: Self-pay

## 2015-10-25 NOTE — Telephone Encounter (Signed)
Patient walked in office 10/05/15 requesting Dr.Kelly fill out and sign cardiovascular part of DMV form.Dr.Kelly completed and signed.Form left at front desk for pick up.

## 2015-10-25 NOTE — Patient Outreach (Signed)
Transition of care: Placed 2 calls to patient today for transition of care.  First call, daughter reported patient was asleep.  Second call. No answer. Left a message requesting a return call.  PLAN: Will continue to outreach patient for transition of care.   [name], RN, BSN, CEN [facility] [employer] [phone]

## 2015-10-26 ENCOUNTER — Ambulatory Visit (INDEPENDENT_AMBULATORY_CARE_PROVIDER_SITE_OTHER): Payer: Medicare Other | Admitting: Pharmacist Clinician (PhC)/ Clinical Pharmacy Specialist

## 2015-10-26 DIAGNOSIS — N186 End stage renal disease: Secondary | ICD-10-CM | POA: Diagnosis not present

## 2015-10-26 DIAGNOSIS — D631 Anemia in chronic kidney disease: Secondary | ICD-10-CM | POA: Diagnosis not present

## 2015-10-26 DIAGNOSIS — Z954 Presence of other heart-valve replacement: Secondary | ICD-10-CM

## 2015-10-26 DIAGNOSIS — E118 Type 2 diabetes mellitus with unspecified complications: Secondary | ICD-10-CM | POA: Diagnosis not present

## 2015-10-26 DIAGNOSIS — Z7901 Long term (current) use of anticoagulants: Secondary | ICD-10-CM

## 2015-10-26 DIAGNOSIS — Z952 Presence of prosthetic heart valve: Secondary | ICD-10-CM

## 2015-10-26 DIAGNOSIS — E876 Hypokalemia: Secondary | ICD-10-CM | POA: Diagnosis not present

## 2015-10-29 DIAGNOSIS — E118 Type 2 diabetes mellitus with unspecified complications: Secondary | ICD-10-CM | POA: Diagnosis not present

## 2015-10-29 DIAGNOSIS — N186 End stage renal disease: Secondary | ICD-10-CM | POA: Diagnosis not present

## 2015-10-29 DIAGNOSIS — E876 Hypokalemia: Secondary | ICD-10-CM | POA: Diagnosis not present

## 2015-10-29 DIAGNOSIS — D631 Anemia in chronic kidney disease: Secondary | ICD-10-CM | POA: Diagnosis not present

## 2015-10-30 ENCOUNTER — Ambulatory Visit (INDEPENDENT_AMBULATORY_CARE_PROVIDER_SITE_OTHER): Payer: Medicare Other | Admitting: Vascular Surgery

## 2015-10-30 ENCOUNTER — Encounter: Payer: Self-pay | Admitting: Vascular Surgery

## 2015-10-30 ENCOUNTER — Other Ambulatory Visit: Payer: Self-pay

## 2015-10-30 ENCOUNTER — Telehealth: Payer: Self-pay | Admitting: Pharmacist

## 2015-10-30 VITALS — BP 122/64 | HR 68 | Temp 97.2°F | Ht 64.0 in | Wt 124.2 lb

## 2015-10-30 DIAGNOSIS — I739 Peripheral vascular disease, unspecified: Secondary | ICD-10-CM | POA: Diagnosis not present

## 2015-10-30 MED ORDER — ENOXAPARIN SODIUM 40 MG/0.4ML ~~LOC~~ SOLN: 40.0000 mg | SUBCUTANEOUS | 1 refills | Status: DC
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
## 2015-10-30 NOTE — Telephone Encounter (Signed)
Colletta Maryland LM that pt is to have procedure on 9/26 and will need to be off warfarin for 5 days prior. They have instructed her to take her last dose tomorrow.   She will need to be bridged with lovenox.    Spoke to [place] about lovenox. Tried to schedule appt in office to go over dosing instructions, pt declined.   Of note pt is dialysis pt. Thus she will need once daily dosing (about 1mg /kg) - she was 56kg at vascular visit today - will round down to 40mg  due to kidney function.  Will discontinue lovenox 48 hours prior to procedure.   Paged Dr. [name] to go over dosing since complicated patient with no response. Discussed with Dr. Gwenlyn Found DOD since procedure is next week. He is agreeable to below dosing.    9/20: Last dose of Coumadin.  9/21: No Coumadin or Lovenox.  9/22: Inject Lovenox 40mg  in the fatty abdominal tissue at least 2 inches from the belly button once daily. No Coumadin.  9/23: Inject Lovenox in the fatty tissue once daily. No Coumadin.  9/24: Inject Lovenox in the fatty tissue once daily. No Coumadin.  9/25: No Lovenox and No Coumadin.  9/26: Procedure Day - No Lovenox - Resume Coumadin in the evening or as directed by doctor (take an extra half tablet with usual dose for 2 days then resume normal dose).  9/27: Resume Lovenox inject in the fatty tissue daily and take Coumadin.  9/28: Inject Lovenox in the fatty tissue daily and take Coumadin.  9/29: Inject Lovenox in the fatty tissue daily and take Coumadin.  9/30: Inject Lovenox in the fatty tissue daily and take Coumadin.  9/31: Inject Lovenox in the fatty tissue daily and take Coumadin.  10/1: Inject Lovenox in fatty tissue daily  10/2: Inject Lovenox in fatty tissue daily and INR with dialysis

## 2015-10-31 ENCOUNTER — Other Ambulatory Visit: Payer: Self-pay | Admitting: Cardiovascular Disease

## 2015-10-31 DIAGNOSIS — D631 Anemia in chronic kidney disease: Secondary | ICD-10-CM | POA: Diagnosis not present

## 2015-10-31 DIAGNOSIS — I359 Nonrheumatic aortic valve disorder, unspecified: Secondary | ICD-10-CM | POA: Diagnosis not present

## 2015-10-31 DIAGNOSIS — E876 Hypokalemia: Secondary | ICD-10-CM | POA: Diagnosis not present

## 2015-10-31 DIAGNOSIS — E118 Type 2 diabetes mellitus with unspecified complications: Secondary | ICD-10-CM | POA: Diagnosis not present

## 2015-10-31 DIAGNOSIS — N186 End stage renal disease: Secondary | ICD-10-CM | POA: Diagnosis not present

## 2015-10-31 LAB — PROTIME-INR: INR: 1.8 — AB (ref 0.9–1.1)

## 2015-10-31 NOTE — Telephone Encounter (Signed)
Daughter states she has injections, reviewed instructions over the phone.  Daughter voiced understanding .  Knows to call with questions/concerns.

## 2015-11-01 ENCOUNTER — Ambulatory Visit (INDEPENDENT_AMBULATORY_CARE_PROVIDER_SITE_OTHER): Payer: Medicare Other | Admitting: Pharmacist

## 2015-11-01 ENCOUNTER — Other Ambulatory Visit: Payer: Self-pay

## 2015-11-01 DIAGNOSIS — Z954 Presence of other heart-valve replacement: Secondary | ICD-10-CM

## 2015-11-01 DIAGNOSIS — Z23 Encounter for immunization: Secondary | ICD-10-CM | POA: Diagnosis not present

## 2015-11-01 DIAGNOSIS — Z4823 Encounter for aftercare following liver transplant: Secondary | ICD-10-CM | POA: Diagnosis not present

## 2015-11-01 DIAGNOSIS — Z7901 Long term (current) use of anticoagulants: Secondary | ICD-10-CM

## 2015-11-01 DIAGNOSIS — Z79899 Other long term (current) drug therapy: Secondary | ICD-10-CM | POA: Diagnosis not present

## 2015-11-01 DIAGNOSIS — Z952 Presence of prosthetic heart valve: Secondary | ICD-10-CM

## 2015-11-01 NOTE — Patient Outreach (Signed)
Transition of care call: Placed call to patient who reports that she is doing well. States that she is in [place] to see a doctor and would have to call me back. Confirms that she will have a procedure next week. Reports dialysis is going well.    PLAN:  Patient reports that she will call me back later. Will await a return call from patient.  [name], RN, BSN, CEN [facility] [employer] [phone]

## 2015-11-02 DIAGNOSIS — N186 End stage renal disease: Secondary | ICD-10-CM | POA: Diagnosis not present

## 2015-11-02 DIAGNOSIS — E876 Hypokalemia: Secondary | ICD-10-CM | POA: Diagnosis not present

## 2015-11-02 DIAGNOSIS — E118 Type 2 diabetes mellitus with unspecified complications: Secondary | ICD-10-CM | POA: Diagnosis not present

## 2015-11-02 DIAGNOSIS — D631 Anemia in chronic kidney disease: Secondary | ICD-10-CM | POA: Diagnosis not present

## 2015-11-05 DIAGNOSIS — N186 End stage renal disease: Secondary | ICD-10-CM | POA: Diagnosis not present

## 2015-11-05 DIAGNOSIS — E118 Type 2 diabetes mellitus with unspecified complications: Secondary | ICD-10-CM | POA: Diagnosis not present

## 2015-11-05 DIAGNOSIS — E876 Hypokalemia: Secondary | ICD-10-CM | POA: Diagnosis not present

## 2015-11-05 DIAGNOSIS — D631 Anemia in chronic kidney disease: Secondary | ICD-10-CM | POA: Diagnosis not present

## 2015-11-06 ENCOUNTER — Ambulatory Visit (HOSPITAL_COMMUNITY)
Admission: RE | Admit: 2015-11-06 | Discharge: 2015-11-06 | Disposition: A | Discharge status: Home / Self Care | Payer: Medicare Other | Source: Ambulatory Visit | Attending: Surgery | Admitting: Surgery

## 2015-11-06 ENCOUNTER — Encounter (HOSPITAL_COMMUNITY)
Admission: RE | Disposition: A | Discharge status: Home / Self Care | Payer: Self-pay | Source: Ambulatory Visit | Attending: Surgery

## 2015-11-06 DIAGNOSIS — E1122 Type 2 diabetes mellitus with diabetic chronic kidney disease: Secondary | ICD-10-CM | POA: Insufficient documentation

## 2015-11-06 DIAGNOSIS — Z823 Family history of stroke: Secondary | ICD-10-CM | POA: Insufficient documentation

## 2015-11-06 DIAGNOSIS — I70222 Atherosclerosis of native arteries of extremities with rest pain, left leg: Secondary | ICD-10-CM | POA: Insufficient documentation

## 2015-11-06 DIAGNOSIS — F329 Major depressive disorder, single episode, unspecified: Secondary | ICD-10-CM | POA: Insufficient documentation

## 2015-11-06 DIAGNOSIS — Z833 Family history of diabetes mellitus: Secondary | ICD-10-CM | POA: Insufficient documentation

## 2015-11-06 DIAGNOSIS — I120 Hypertensive chronic kidney disease with stage 5 chronic kidney disease or end stage renal disease: Secondary | ICD-10-CM | POA: Diagnosis not present

## 2015-11-06 DIAGNOSIS — M1990 Unspecified osteoarthritis, unspecified site: Secondary | ICD-10-CM | POA: Insufficient documentation

## 2015-11-06 DIAGNOSIS — Z7901 Long term (current) use of anticoagulants: Secondary | ICD-10-CM | POA: Insufficient documentation

## 2015-11-06 DIAGNOSIS — Z951 Presence of aortocoronary bypass graft: Secondary | ICD-10-CM | POA: Insufficient documentation

## 2015-11-06 DIAGNOSIS — Z992 Dependence on renal dialysis: Secondary | ICD-10-CM | POA: Diagnosis not present

## 2015-11-06 DIAGNOSIS — I2510 Atherosclerotic heart disease of native coronary artery without angina pectoris: Secondary | ICD-10-CM | POA: Insufficient documentation

## 2015-11-06 DIAGNOSIS — J449 Chronic obstructive pulmonary disease, unspecified: Secondary | ICD-10-CM | POA: Diagnosis not present

## 2015-11-06 DIAGNOSIS — Z6821 Body mass index (BMI) 21.0-21.9, adult: Secondary | ICD-10-CM | POA: Insufficient documentation

## 2015-11-06 DIAGNOSIS — F17210 Nicotine dependence, cigarettes, uncomplicated: Secondary | ICD-10-CM | POA: Insufficient documentation

## 2015-11-06 DIAGNOSIS — F419 Anxiety disorder, unspecified: Secondary | ICD-10-CM | POA: Insufficient documentation

## 2015-11-06 DIAGNOSIS — Z885 Allergy status to narcotic agent status: Secondary | ICD-10-CM | POA: Insufficient documentation

## 2015-11-06 DIAGNOSIS — E039 Hypothyroidism, unspecified: Secondary | ICD-10-CM | POA: Diagnosis not present

## 2015-11-06 DIAGNOSIS — N186 End stage renal disease: Secondary | ICD-10-CM | POA: Diagnosis not present

## 2015-11-06 DIAGNOSIS — K5909 Other constipation: Secondary | ICD-10-CM | POA: Diagnosis not present

## 2015-11-06 DIAGNOSIS — G8929 Other chronic pain: Secondary | ICD-10-CM | POA: Insufficient documentation

## 2015-11-06 DIAGNOSIS — M549 Dorsalgia, unspecified: Secondary | ICD-10-CM | POA: Diagnosis not present

## 2015-11-06 DIAGNOSIS — I471 Supraventricular tachycardia: Secondary | ICD-10-CM | POA: Diagnosis not present

## 2015-11-06 DIAGNOSIS — E669 Obesity, unspecified: Secondary | ICD-10-CM | POA: Diagnosis not present

## 2015-11-06 DIAGNOSIS — Z944 Liver transplant status: Secondary | ICD-10-CM | POA: Insufficient documentation

## 2015-11-06 DIAGNOSIS — Z952 Presence of prosthetic heart valve: Secondary | ICD-10-CM | POA: Diagnosis not present

## 2015-11-06 DIAGNOSIS — D649 Anemia, unspecified: Secondary | ICD-10-CM | POA: Insufficient documentation

## 2015-11-06 DIAGNOSIS — Z8249 Family history of ischemic heart disease and other diseases of the circulatory system: Secondary | ICD-10-CM | POA: Insufficient documentation

## 2015-11-06 DIAGNOSIS — K219 Gastro-esophageal reflux disease without esophagitis: Secondary | ICD-10-CM | POA: Insufficient documentation

## 2015-11-06 DIAGNOSIS — G43909 Migraine, unspecified, not intractable, without status migrainosus: Secondary | ICD-10-CM | POA: Diagnosis not present

## 2015-11-06 DIAGNOSIS — E1151 Type 2 diabetes mellitus with diabetic peripheral angiopathy without gangrene: Secondary | ICD-10-CM | POA: Insufficient documentation

## 2015-11-06 DIAGNOSIS — Z7982 Long term (current) use of aspirin: Secondary | ICD-10-CM | POA: Insufficient documentation

## 2015-11-06 DIAGNOSIS — Z794 Long term (current) use of insulin: Secondary | ICD-10-CM | POA: Insufficient documentation

## 2015-11-06 HISTORY — PX: PERIPHERAL VASCULAR CATHETERIZATION: SHX172C

## 2015-11-06 LAB — POCT ACTIVATED CLOTTING TIME
ACTIVATED CLOTTING TIME: 202 s
Activated Clotting Time: 224 seconds
Activated Clotting Time: 230 seconds
Activated Clotting Time: 175 seconds
Activated Clotting Time: 186 seconds

## 2015-11-06 LAB — PROTIME-INR
INR: 0.97
Prothrombin Time: 12.9 s (ref 11.4–15.2)

## 2015-11-06 LAB — POCT I-STAT, CHEM 8
BUN: 20 mg/dL (ref 6–20)
CHLORIDE: 99 mmol/L — AB (ref 101–111)
CREATININE: 4.3 mg/dL — AB (ref 0.44–1.00)
Calcium, Ion: 1.26 mmol/L (ref 1.15–1.40)
Glucose, Bld: 91 mg/dL (ref 65–99)
HCT: 36 % (ref 36.0–46.0)
HEMOGLOBIN: 12.2 g/dL (ref 12.0–15.0)
POTASSIUM: 5 mmol/L (ref 3.5–5.1)
Sodium: 138 mmol/L (ref 135–145)
TCO2: 32 mmol/L (ref 0–100)

## 2015-11-06 LAB — GLUCOSE, CAPILLARY: Glucose-Capillary: 73 mg/dL (ref 65–99)

## 2015-11-06 SURGERY — ABDOMINAL AORTOGRAM
Anesthesia: LOCAL

## 2015-11-06 MED ORDER — LABETALOL HCL 5 MG/ML IV SOLN
10.0000 mg | INTRAVENOUS | Status: DC | PRN
  Administered 2015-11-06 (×3): 10 mg via INTRAVENOUS

## 2015-11-06 MED ORDER — OXYCODONE HCL 5 MG PO TABS
ORAL_TABLET | ORAL | Status: AC
  Administered 2015-11-06: 10 mg via ORAL
  Filled 2015-11-06: qty 2

## 2015-11-06 MED ORDER — FENTANYL CITRATE (PF) 100 MCG/2ML IJ SOLN
INTRAMUSCULAR | Status: AC
  Filled 2015-11-06: qty 2

## 2015-11-06 MED ORDER — HYDROMORPHONE HCL 1 MG/ML IJ SOLN: 0.5000 mg | INTRAMUSCULAR | Status: DC | PRN

## 2015-11-06 MED ORDER — HYDRALAZINE HCL 20 MG/ML IJ SOLN
INTRAMUSCULAR | Status: AC
  Filled 2015-11-06: qty 1

## 2015-11-06 MED ORDER — FENTANYL CITRATE (PF) 100 MCG/2ML IJ SOLN
INTRAMUSCULAR | Status: DC | PRN
  Administered 2015-11-06 (×4): 25 ug via INTRAVENOUS
  Administered 2015-11-06: 50 ug
  Administered 2015-11-06: 25 ug via INTRAVENOUS

## 2015-11-06 MED ORDER — VIPERSLIDE LUBRICANT OPTIME
TOPICAL | Status: DC | PRN
  Administered 2015-11-06: 12:00:00 via SURGICAL_CAVITY

## 2015-11-06 MED ORDER — MIDAZOLAM HCL 2 MG/2ML IJ SOLN
INTRAMUSCULAR | Status: DC | PRN
  Administered 2015-11-06: 1 mg via INTRAVENOUS

## 2015-11-06 MED ORDER — FENTANYL CITRATE (PF) 100 MCG/2ML IJ SOLN: 50.0000 ug | Freq: Once | INTRAMUSCULAR | Status: DC

## 2015-11-06 MED ORDER — HEPARIN (PORCINE) IN NACL 2-0.9 UNIT/ML-% IJ SOLN
INTRAMUSCULAR | Status: AC
  Filled 2015-11-06: qty 1000

## 2015-11-06 MED ORDER — METOPROLOL TARTRATE 5 MG/5ML IV SOLN: 2.0000 mg | INTRAVENOUS | Status: DC | PRN

## 2015-11-06 MED ORDER — LIDOCAINE HCL (PF) 1 % IJ SOLN
INTRAMUSCULAR | Status: AC
  Filled 2015-11-06: qty 30

## 2015-11-06 MED ORDER — HYDRALAZINE HCL 20 MG/ML IJ SOLN
5.0000 mg | INTRAMUSCULAR | Status: AC | PRN
  Administered 2015-11-06 (×2): 5 mg via INTRAVENOUS

## 2015-11-06 MED ORDER — OXYCODONE HCL 5 MG PO TABS
ORAL_TABLET | ORAL | Status: AC
  Filled 2015-11-06: qty 1

## 2015-11-06 MED ORDER — SODIUM CHLORIDE 0.9% FLUSH: 3.0000 mL | INTRAVENOUS | Status: DC | PRN

## 2015-11-06 MED ORDER — MIDAZOLAM HCL 2 MG/2ML IJ SOLN
INTRAMUSCULAR | Status: AC
  Filled 2015-11-06: qty 2

## 2015-11-06 MED ORDER — LIDOCAINE HCL (PF) 1 % IJ SOLN
INTRAMUSCULAR | Status: DC | PRN
Start: 2015-11-06 — End: 2015-11-06
  Administered 2015-11-06: 12 mL via SUBCUTANEOUS

## 2015-11-06 MED ORDER — LABETALOL HCL 5 MG/ML IV SOLN
INTRAVENOUS | Status: AC
  Filled 2015-11-06: qty 4

## 2015-11-06 MED ORDER — IODIXANOL 320 MG/ML IV SOLN
INTRAVENOUS | Status: DC | PRN
  Administered 2015-11-06: 205 mL via INTRA_ARTERIAL

## 2015-11-06 MED ORDER — ALUM & MAG HYDROXIDE-SIMETH 200-200-20 MG/5ML PO SUSP: 15.0000 mL | ORAL | Status: DC | PRN

## 2015-11-06 MED ORDER — ONDANSETRON HCL 4 MG/2ML IJ SOLN: 4.0000 mg | Freq: Four times a day (QID) | INTRAMUSCULAR | Status: DC | PRN

## 2015-11-06 MED ORDER — HEPARIN (PORCINE) IN NACL 2-0.9 UNIT/ML-% IJ SOLN
INTRAMUSCULAR | Status: DC | PRN
  Administered 2015-11-06: 1000 mL via INTRA_ARTERIAL

## 2015-11-06 MED ORDER — HEPARIN SODIUM (PORCINE) 1000 UNIT/ML IJ SOLN
INTRAMUSCULAR | Status: DC | PRN
  Administered 2015-11-06: 6000 [IU] via INTRAVENOUS
  Administered 2015-11-06: 2000 [IU] via INTRAVENOUS

## 2015-11-06 MED ORDER — PHENOL 1.4 % MT LIQD: 1.0000 | OROMUCOSAL | Status: DC | PRN

## 2015-11-06 MED ORDER — MIDAZOLAM HCL 2 MG/2ML IJ SOLN
INTRAMUSCULAR | Status: DC | PRN
  Administered 2015-11-06 (×5): 1 mg via INTRAVENOUS

## 2015-11-06 MED ORDER — OXYCODONE HCL 5 MG PO TABS
5.0000 mg | ORAL_TABLET | ORAL | Status: DC | PRN
  Administered 2015-11-06 (×2): 5 mg via ORAL
  Administered 2015-11-06: 10 mg via ORAL

## 2015-11-06 MED ORDER — GUAIFENESIN-DM 100-10 MG/5ML PO SYRP: 15.0000 mL | ORAL_SOLUTION | ORAL | Status: DC | PRN

## 2015-11-06 MED ORDER — DOCUSATE SODIUM 100 MG PO CAPS: 100.0000 mg | ORAL_CAPSULE | Freq: Every day | ORAL | Status: DC

## 2015-11-06 SURGICAL SUPPLY — 30 items
BALLN LUTONIX DCB 4X60X130 (BALLOONS) ×3
BALLOON LUTONIX DCB 4X60X130 (BALLOONS) ×2 IMPLANT
BUR JETSTREAM XC 2.1/3.0 (BURR) ×2 IMPLANT
BURR JETSTREAM XC 2.1/3.0 (BURR) ×3
CATH ANGIO 5F BER2 65CM (CATHETERS) ×3 IMPLANT
CATH OMNI FLUSH 5F 65CM (CATHETERS) ×3 IMPLANT
CATH QUICKCROSS SUPP .035X90CM (MICROCATHETER) ×3 IMPLANT
CATH SOFT-VU 4F 65 STRAIGHT (CATHETERS) ×2 IMPLANT
CATH SOFT-VU STRAIGHT 4F 65CM (CATHETERS) ×1
COVER PRB 48X5XTLSCP FOLD TPE (BAG) ×2 IMPLANT
COVER PROBE 5X48 (BAG) ×1
DEVICE CONTINUOUS FLUSH (MISCELLANEOUS) ×3 IMPLANT
DEVICE EMBOSHIELD NAV6 4.0-7.0 (WIRE) ×3 IMPLANT
DEVICE TORQUE H2O (MISCELLANEOUS) ×3 IMPLANT
KIT ENCORE 26 ADVANTAGE (KITS) ×3 IMPLANT
KIT MICROINTRODUCER STIFF 5F (SHEATH) ×3 IMPLANT
KIT PV (KITS) ×3 IMPLANT
SHEATH PINNACLE 5F 10CM (SHEATH) ×6 IMPLANT
SHEATH PINNACLE 8F 10CM (SHEATH) ×3 IMPLANT
SHEATH PINNACLE ST 7F 45CM (SHEATH) ×3 IMPLANT
SYR MEDRAD MARK V 150ML (SYRINGE) ×3 IMPLANT
TAPE RADIOPAQUE TURBO (MISCELLANEOUS) ×3 IMPLANT
TRANSDUCER W/STOPCOCK (MISCELLANEOUS) ×3 IMPLANT
TRAY PV CATH (CUSTOM PROCEDURE TRAY) ×3 IMPLANT
TUBING ART PRESS 72  MALE/FEM (TUBING) ×1
TUBING ART PRESS 72 MALE/FEM (TUBING) ×2 IMPLANT
WIRE AMPLATZ SS-J .035X180CM (WIRE) ×3 IMPLANT
WIRE BAREWIRE WORK .014X315CM (WIRE) ×3 IMPLANT
WIRE BENTSON .035X145CM (WIRE) ×3 IMPLANT
WIRE G V18X300CM (WIRE) ×3 IMPLANT

## 2015-11-06 NOTE — Op Note (Signed)
Patient name: [name] MRN: [id number] DOB: Aug 25, 1955 Sex: female  11/06/2015 Pre-operative Diagnosis: Left leg rest pain Post-operative diagnosis:  Same Surgeon:  [name] Procedure Performed:  1.  Ultrasound-guided access, right femoral artery  2.  Ultrasound-guided placement of a right common femoral vein central venous catheter  3.  Abdominal aortogram  4.  Bilateral lower extremity runoff  5.  Atherectomy, left superficial femoral artery  6.  Drug coated balloon angioplasty, left superficial femoral artery  7.  Conscious sedation (90 minutes)   Indications:  The patient has multiple medical problems and has developed rest pain in her left foot.  She has a dialysis catheter in her left thigh.  She comes in today for arterial evaluation  Procedure:  The patient was identified in the holding area and taken to room 8.  The patient was then placed supine on the table and prepped and draped in the usual sterile fashion.  A time out was called.  Conscious sedation was performed with the use of IV fentanyl and Versed under continuous physician and nurse monitoring.  Oxygen levels, heart rate, and blood pressure were continuously monitored.  I initially evaluated the right common femoral vein which is widely patent and easily compressible.  After 1% lidocaine was used, the vein was cannulated under ultrasound guidance with micropuncture needle.  A wire was advanced without resistance followed by micropuncture sheath.  Over a 035 wire, a 5 French sheath was placed into the right common femoral vein.    Ultrasound was used to evaluate the right common femoral artery.  It was patent .  A digital ultrasound image was acquired.  A micropuncture needle was used to access the right common femoral artery under ultrasound guidance.  An 018 wire was advanced without resistance and a micropuncture sheath was placed.  The 018 wire was removed and a benson wire was placed.  The micropuncture  sheath was exchanged for a 5 french sheath.  An omniflush catheter was advanced over the wire to the level of L-1.  An abdominal angiogram was obtained.  Next, using the omniflush catheter and a benson wire, the aortic bifurcation was crossed and the catheter was placed into theleft external iliac artery and left runoff was obtained.  right runoff was performed via retrograde sheath injections.  Findings:   Aortogram:  No significant infrarenal aortic stenosis.  The left renal artery is surgically absent.  No stenosis within the right renal artery.  Bilateral common and external iliac arteries are widely patent.  Right Lower Extremity:  The right common femoral and profunda femoral artery are patent throughout the course.  The right superficial femoral artery is patent however there is approximately 60% stenosis within the adductor canal.  Popliteal artery is patent throughout it's course.  The anterior tibial is the dominant runoff vessel however all 3 tibial vessels are patent down to the ankle, the peroneal and posterior tibial are very diminutive.  Left Lower Extremity:  Left common femoral and profunda femoral artery are patent throughout her course.  In the adductor canal and the superficial femoral artery there are 2 areas of high-grade stenosis, greater than 90%.  The popliteal artery is patent.  The dominant runoff vessel is the peroneal artery.  Diminutive posterior tibial and anterior tibial are seen down to the ankle.  A dialysis graft is seen in the left thigh which is patent.  There is approximately 50% stenosis at the origin of the arterial limb of the thigh  graft  Intervention:  After the above images were acquired, the decision was made to proceed with intervention.  Over Amplatz Super Stiff wire, the 7 French sheath was advanced into the left external iliac artery.  The patient was fully heparinized.  Using a V 18 wire and a quick cross catheter, the lesion was successfully crossed.  Next, a  Meier wire was utilized so that I could place a large NAV-6 filter in the popliteal artery.  I then selected a jetstream device to perform atherectomy of the left superficial femoral artery.  2 passes were performed with the device on in the antegrade and retrograde position.  Next, a drug-coated Lutonix 4 x 60 balloon was used to perform balloon and plasty for 2 minutes.  Completion imaging was then performed which showed complete resolution of the stenosis and no change in the runoff.  Catheters and wires were removed.  The patient will be taken the holding area for sheath pull was fibrillation profile corrects.  Impression:  #1  successful atherectomy with a jetstream and drug coated balloon angioplasty using a 4 x 60 Lutonix of greater than 90% stenosis within the superficial femoral artery at the adductor canal.  Residual stenosis was less than 5%.  A distal protection device was utilized.  #2  focal 60% stenosis within the right superficial femoral artery  #3  stenosis at the origin of the left thigh graft arterial and    V. [name], M.D. [facility] Office: [phone] Pager:  [phone]

## 2015-11-06 NOTE — Progress Notes (Signed)
Site area: RFV Site Prior to Removal:  Level 0 Pressure Applied For:83min Manual:  yes  Patient Status During Pull: stable  Post Pull Site:  Level 0 Post Pull Instructions Given:yes   Post Pull Pulses Present: na Dressing Applied:  tegaderm Bedrest begins @ 1610 per arterial removal Comments:By Moishe Spice

## 2015-11-06 NOTE — Discharge Instructions (Signed)
Groin Surgical Site Care °Refer to this sheet in the next few weeks. These instructions provide you with information about caring for yourself after your procedure. Your health care provider may also give you more specific instructions. Your treatment has been planned according to current medical practices, but problems sometimes occur. Call your health care provider if you have any problems or questions after your procedure. °WHAT TO EXPECT AFTER THE PROCEDURE °After your procedure, it is typical to have the following: °· Bruising at the groin site that usually fades within 1-2 weeks. °· Blood collecting in the tissue (hematoma) that may be painful to the touch. It should usually decrease in size and tenderness within 1-2 weeks. °HOME CARE INSTRUCTIONS °· Take medicines only as directed by your health care provider. °· You may shower 24-48 hours after the procedure or as directed by your health care provider. Remove the bandage (dressing) and gently wash the site with plain soap and water. Pat the area dry with a clean towel. Do not rub the site, because this may cause bleeding. °· Do not take baths, swim, or use a hot tub until your health care provider approves. °· Check your insertion site every day for redness, swelling, or drainage. °· Do not apply powder or lotion to the site. °· Limit use of stairs to twice a day for the first 2-3 days or as directed by your health care provider. °· Do not squat for the first 2-3 days or as directed by your health care provider. °· Do not lift over 10 lb (4.5 kg) for 5 days after your procedure or as directed by your health care provider. °· Ask your health care provider when it is okay to: °¨ Return to work or school. °¨ Resume usual physical activities or sports. °¨ Resume sexual activity. °· Do not drive home if you are discharged the same day as the procedure. Have someone else drive you. °· You may drive 24 hours after the procedure unless otherwise instructed by your  health care provider. °· Do not operate machinery or power tools for 24 hours after the procedure or as directed by your health care provider. °· If your procedure was done as an outpatient procedure, which means that you went home the same day as your procedure, a responsible adult should be with you for the first 24 hours after you arrive home. °· Keep all follow-up visits as directed by your health care provider. This is important. °SEEK MEDICAL CARE IF: °· You have a fever. °· You have chills. °· You have increased bleeding from the groin site. Hold pressure on the site. °SEEK IMMEDIATE MEDICAL CARE IF: °· You have unusual pain at the groin site. °· You have redness, warmth, or swelling at the groin site. °· You have drainage (other than a small amount of blood on the dressing) from the groin site. °· The groin site is bleeding, and the bleeding does not stop after 30 minutes of holding steady pressure on the site. °· Your leg or foot becomes pale, cool, tingly, or numb. °  °This information is not intended to replace advice given to you by your health care provider. Make sure you discuss any questions you have with your health care provider. °  °Document Released: 09/30/2013 Document Reviewed: 09/30/2013 °Elsevier Interactive Patient Education ©2016 [employer] ° °

## 2015-11-06 NOTE — Progress Notes (Signed)
Attempted to remove the long sheath in the RFA. The sheath did not move with gentle pull. Attempt was stopped when resistance was met, and patient experienced pain. Sheath flushes

## 2015-11-06 NOTE — Progress Notes (Signed)
Up and walked and right groin stable; no bleeding or hematoma 

## 2015-11-06 NOTE — Progress Notes (Signed)
Site area: RFA Site Prior to Removal:  Level 0 Pressure Applied For:15 Manual:   15 minutes Patient Status During Pull: stable  Post Pull Site:  Level 0 Post Pull Instructions Given:  yes Post Pull Pulses Present: yes doppler RT PT Dressing Applied:  yes Bedrest begins @ 16:10 Comments: Sheath pulled against resistance by kim councilman per Dr Trula Slade. Dr Trula Slade aware. manual pressure taken over by Garen Lah. Patient tolerated well.

## 2015-11-07 ENCOUNTER — Encounter (HOSPITAL_COMMUNITY): Payer: Self-pay | Admitting: Surgery

## 2015-11-07 ENCOUNTER — Telehealth: Payer: Self-pay | Admitting: Vascular Surgery

## 2015-11-07 DIAGNOSIS — E118 Type 2 diabetes mellitus with unspecified complications: Secondary | ICD-10-CM | POA: Diagnosis not present

## 2015-11-07 DIAGNOSIS — D631 Anemia in chronic kidney disease: Secondary | ICD-10-CM | POA: Diagnosis not present

## 2015-11-07 DIAGNOSIS — I359 Nonrheumatic aortic valve disorder, unspecified: Secondary | ICD-10-CM | POA: Diagnosis not present

## 2015-11-07 DIAGNOSIS — N186 End stage renal disease: Secondary | ICD-10-CM | POA: Diagnosis not present

## 2015-11-07 DIAGNOSIS — E876 Hypokalemia: Secondary | ICD-10-CM | POA: Diagnosis not present

## 2015-11-07 MED ORDER — FENTANYL CITRATE (PF) 100 MCG/2ML IJ SOLN
INTRAMUSCULAR | Status: DC | PRN
  Administered 2015-11-06: 25 ug via INTRAVENOUS

## 2015-11-07 NOTE — Telephone Encounter (Signed)
-----   Message from Mena Goes, RN sent at 11/06/2015  1:12 PM EDT ----- Regarding: 2 weeks appt   ----- Message ----- From: Serafina Mitchell, MD Sent: 11/06/2015   1:06 PM To: [facility] Charge Pool  11/06/2015:  Surgeon:  [name] Procedure Performed:  1.  Ultrasound-guided access, right femoral artery  2.  Ultrasound-guided placement of a right common femoral vein central venous catheter  3.  Abdominal aortogram  4.  Bilateral lower extremity runoff  5.  Atherectomy, left superficial femoral artery  6.  Drug coated balloon angioplasty, left superficial femoral artery  7.  Conscious sedation (90 minutes)  Have patient see Dr. early in 2 weeks

## 2015-11-07 NOTE — Telephone Encounter (Signed)
spoke to daughter for appt on 10/10 ( needs to stay a late appt she has dialysis in the am's)

## 2015-11-08 ENCOUNTER — Other Ambulatory Visit: Payer: Self-pay

## 2015-11-08 ENCOUNTER — Ambulatory Visit: Payer: Medicare Other

## 2015-11-08 ENCOUNTER — Ambulatory Visit: Payer: Self-pay

## 2015-11-08 NOTE — Interval H&P Note (Signed)
History and Physical Interval Note:  11/08/2015 2:15 PM  [name]  has presented today for surgery, with the diagnosis of PVD with left leg rest pain  The various methods of treatment have been discussed with the patient and family. After consideration of risks, benefits and other options for treatment, the patient has consented to  Procedure(s) with comments: Abdominal Aortogram (N/A) Lower Extremity Angiography (N/A) Peripheral Vascular Atherectomy - Left Superficial femoral as a surgical intervention .  The patient's history has been reviewed, patient examined, no change in status, stable for surgery.  I have reviewed the patient's chart and labs.  Questions were answered to the patient's satisfaction.     [name]
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
## 2015-11-08 NOTE — Patient Outreach (Signed)
Transition of care: Placed call to patient and daughter who are on consent form. Unable to reach.Phone states that patient is not accepting calls.  I have had difficulty for 3 weeks to complete a phone call as patient has not been able to talk during any attempt.  PLAN: Will mail letter and continue to attempt.  [name], RN, BSN, CEN [facility] [employer] [phone]

## 2015-11-09 DIAGNOSIS — D631 Anemia in chronic kidney disease: Secondary | ICD-10-CM | POA: Diagnosis not present

## 2015-11-09 DIAGNOSIS — E118 Type 2 diabetes mellitus with unspecified complications: Secondary | ICD-10-CM | POA: Diagnosis not present

## 2015-11-09 DIAGNOSIS — N186 End stage renal disease: Secondary | ICD-10-CM | POA: Diagnosis not present

## 2015-11-09 DIAGNOSIS — E876 Hypokalemia: Secondary | ICD-10-CM | POA: Diagnosis not present

## 2015-11-10 DIAGNOSIS — T8640 Unspecified complication of liver transplant: Secondary | ICD-10-CM | POA: Diagnosis not present

## 2015-11-10 DIAGNOSIS — Z992 Dependence on renal dialysis: Secondary | ICD-10-CM | POA: Diagnosis not present

## 2015-11-10 DIAGNOSIS — N186 End stage renal disease: Secondary | ICD-10-CM | POA: Diagnosis not present

## 2015-11-12 ENCOUNTER — Other Ambulatory Visit: Payer: Self-pay

## 2015-11-12 DIAGNOSIS — I2510 Atherosclerotic heart disease of native coronary artery without angina pectoris: Secondary | ICD-10-CM | POA: Diagnosis not present

## 2015-11-12 DIAGNOSIS — N186 End stage renal disease: Secondary | ICD-10-CM | POA: Diagnosis not present

## 2015-11-12 DIAGNOSIS — E118 Type 2 diabetes mellitus with unspecified complications: Secondary | ICD-10-CM | POA: Diagnosis not present

## 2015-11-12 DIAGNOSIS — D631 Anemia in chronic kidney disease: Secondary | ICD-10-CM | POA: Diagnosis not present

## 2015-11-12 DIAGNOSIS — Z1389 Encounter for screening for other disorder: Secondary | ICD-10-CM | POA: Diagnosis not present

## 2015-11-12 DIAGNOSIS — L97429 Non-pressure chronic ulcer of left heel and midfoot with unspecified severity: Secondary | ICD-10-CM | POA: Diagnosis not present

## 2015-11-12 DIAGNOSIS — E876 Hypokalemia: Secondary | ICD-10-CM | POA: Diagnosis not present

## 2015-11-12 DIAGNOSIS — Z0000 Encounter for general adult medical examination without abnormal findings: Secondary | ICD-10-CM | POA: Diagnosis not present

## 2015-11-12 NOTE — Patient Outreach (Signed)
Transition of care call/ case closure:  Placed call to patient. Patient is asleep after dialysis today. Spoke with daughter Jayme Cloud ( who is on consent).   Reviewed reason for call.  Reviewed transition of care program.  Daughter reports that she takes care of her mother ( pt) reports that she provides transportation to all dialysis treatments and MD appointments.  Daughter reports that they are well connect at the dialysis center.   Daughter reports that patient has follow up surgery visit in 1 week. Daughter reports that patient has been discharged from home health physical therapy and occupational therapy. Daughter reports DM is under control and patient has not needed any insulin. Daughter reports patient has lost a lot of weight but is getting her appetite back and is beginning to eat better.   Daughter reports that they do not need [facility] service at this time and would prefer not to be called back.   PLAN: Will close patient as she has declined further services.  Will mail case closure letter. Will send MD case closure letter.  Will send in basket message to Josepha Pigg to close case.  [facility] CM Care Plan Problem One   Flowsheet Row Most Recent Value  Care Plan Problem One  Recent hospital admission related to left foot pain.  Role Documenting the Problem One  Care Management [place] for Problem One  Active  [facility]N Long Term Goal (31-90 days)  Patient will report no readmissions in the next 31 days.  [facility]N Long Term Goal Start Date  10/19/15  [facility] Long Term Goal Met Date  11/12/15  Interventions for Problem One Long Term Goal  Reviewed transition of care program. Provided my contact information. Reviewed discharge instructions  [facility]N CM Short Term Goal #1 (0-30 days)  Patient will report follow up with vein specialist in 1 week  [facility] CM Short Term Goal #1 Start Date  10/19/15  [facility] CM Short Term Goal #1 Met Date  11/12/15  Interventions for Short Term Goal #1  reviewed  importance of follow up.   THN CM Short Term Goal #2 (0-30 days)  Patient will report follow up with primary MD in the next 14 days  THN CM Short Term Goal #2 Start Date  10/19/15  [facility] CM Short Term Goal #2 Met Date  11/12/15  Interventions for Short Term Goal #2  Reviewed importance of timely follow up with primary MD. Encouraged patient to make an appointment.       [name], RN, BSN, CEN [facility] [employer] [phone]

## 2015-11-14 DIAGNOSIS — E876 Hypokalemia: Secondary | ICD-10-CM | POA: Diagnosis not present

## 2015-11-14 DIAGNOSIS — E118 Type 2 diabetes mellitus with unspecified complications: Secondary | ICD-10-CM | POA: Diagnosis not present

## 2015-11-14 DIAGNOSIS — D631 Anemia in chronic kidney disease: Secondary | ICD-10-CM | POA: Diagnosis not present

## 2015-11-14 DIAGNOSIS — I359 Nonrheumatic aortic valve disorder, unspecified: Secondary | ICD-10-CM | POA: Diagnosis not present

## 2015-11-14 DIAGNOSIS — N186 End stage renal disease: Secondary | ICD-10-CM | POA: Diagnosis not present

## 2015-11-14 LAB — POCT INR: INR: 1.28

## 2015-11-16 ENCOUNTER — Encounter: Payer: Self-pay | Admitting: Vascular Surgery

## 2015-11-16 ENCOUNTER — Ambulatory Visit (INDEPENDENT_AMBULATORY_CARE_PROVIDER_SITE_OTHER): Payer: Medicare Other | Admitting: Pharmacist

## 2015-11-16 DIAGNOSIS — E118 Type 2 diabetes mellitus with unspecified complications: Secondary | ICD-10-CM | POA: Diagnosis not present

## 2015-11-16 DIAGNOSIS — E876 Hypokalemia: Secondary | ICD-10-CM | POA: Diagnosis not present

## 2015-11-16 DIAGNOSIS — Z7901 Long term (current) use of anticoagulants: Secondary | ICD-10-CM

## 2015-11-16 DIAGNOSIS — D631 Anemia in chronic kidney disease: Secondary | ICD-10-CM | POA: Diagnosis not present

## 2015-11-16 DIAGNOSIS — Z952 Presence of prosthetic heart valve: Secondary | ICD-10-CM

## 2015-11-16 DIAGNOSIS — N186 End stage renal disease: Secondary | ICD-10-CM | POA: Diagnosis not present

## 2015-11-19 DIAGNOSIS — N186 End stage renal disease: Secondary | ICD-10-CM | POA: Diagnosis not present

## 2015-11-19 DIAGNOSIS — E876 Hypokalemia: Secondary | ICD-10-CM | POA: Diagnosis not present

## 2015-11-19 DIAGNOSIS — E118 Type 2 diabetes mellitus with unspecified complications: Secondary | ICD-10-CM | POA: Diagnosis not present

## 2015-11-19 DIAGNOSIS — D631 Anemia in chronic kidney disease: Secondary | ICD-10-CM | POA: Diagnosis not present

## 2015-11-20 ENCOUNTER — Encounter: Payer: Self-pay | Admitting: Vascular Surgery

## 2015-11-20 ENCOUNTER — Ambulatory Visit (INDEPENDENT_AMBULATORY_CARE_PROVIDER_SITE_OTHER): Payer: Medicare Other | Admitting: Vascular Surgery

## 2015-11-20 VITALS — BP 157/64 | HR 71 | Temp 97.8°F | Resp 16 | Ht 64.5 in | Wt 135.0 lb

## 2015-11-20 DIAGNOSIS — I739 Peripheral vascular disease, unspecified: Secondary | ICD-10-CM | POA: Diagnosis not present

## 2015-11-20 NOTE — Progress Notes (Signed)
POST OPERATIVE OFFICE NOTE    CC:  Left LE rest pain  HPI:  This is a 60 y.o. female who is s/p  Surgeon:  [name] Procedure Performed:             1.  Ultrasound-guided access, right femoral artery             2.  Ultrasound-guided placement of a right common femoral vein central venous catheter             3.  Abdominal aortogram             4.  Bilateral lower extremity runoff             5.  Atherectomy, left superficial femoral artery             6.  Drug coated balloon angioplasty, left superficial femoral artery             7.  Conscious sedation (90 minutes)   Allergies  Allergen Reactions  . Acetaminophen Other (See Comments)    Liver transplant recipient   . Codeine Itching  . Mirtazapine Other (See Comments)    hallucination  . Morphine Itching and Other (See Comments)    hallucinate    Current Outpatient Prescriptions  Medication Sig Dispense Refill  . aspirin EC 81 MG tablet Take 81 mg by mouth daily.    . calcium acetate (PHOSLO) 667 MG capsule Take 1 capsule (667 mg total) by mouth 3 (three) times daily with meals. 30 capsule 1  . gabapentin (NEURONTIN) 300 MG capsule Take 300 mg by mouth every evening.    [name] levothyroxine (SYNTHROID, LEVOTHROID) 150 MCG tablet Take 150 mcg by mouth daily before breakfast.    . LORazepam (ATIVAN) 0.5 MG tablet Take 0.5 mg by mouth every 6 (six) hours as needed (for nausea and vomiting).   0  . metoprolol tartrate (LOPRESSOR) 25 MG tablet Take 12.5 mg by mouth 2 (two) times daily.     . multivitamin (RENA-VIT) TABS tablet Take 1 tablet by mouth at bedtime. 30 tablet 1  . nitroGLYCERIN (NITROSTAT) 0.4 MG SL tablet Place 1 tablet (0.4 mg total) under the tongue every 5 (five) minutes as needed for chest pain. 30 tablet 0  . ondansetron (ZOFRAN ODT) 4 MG disintegrating tablet Take 1 tablet (4 mg total) by mouth every 8 (eight) hours as needed for nausea. 6 tablet 0  . senna-docusate (SENOKOT-S) 8.6-50 MG tablet Take 1  tablet by mouth daily.    . tacrolimus (PROGRAF) 1 MG capsule Take 1-2 mg by mouth 2 (two) times daily. Take 2 capsules in the morning, and 1 capsule in the evening    . warfarin (COUMADIN) 2 MG tablet Take 1 tablet (2 mg total) by mouth daily. On Tues-Thurs-Sat (Patient taking differently: Take 2 mg by mouth daily. Currently taking 2 mg daily) 50 tablet 1  . warfarin (COUMADIN) 2 MG tablet Take 2 tablets (4 mg total) by mouth daily. On Mon-Wed-Fri-Sun (Patient taking differently: Take 4 mg by mouth daily. Currently taking the 2mg  daily) 1 tablet 0  . warfarin (COUMADIN) 2 MG tablet Take 1- 2 tablets by mouth daily or as directed by coumadin clinic 45 tablet 3   Current Facility-Administered Medications  Medication Dose Route Frequency Provider Last Rate Last Dose  . fentaNYL (SUBLIMAZE) injection 50 mcg  50 mcg Intravenous Once Serafina Mitchell, MD         ROS:  See HPI  Physical Exam:  Vitals:   11/20/15 1422 11/20/15 1426  BP: (!) 156/62 (!) 157/64  Pulse: 71   Resp: 16   Temp: 97.8 F (36.6 C)     Incision:  Right groin soft without hematoma Extremities:  Biphasic doppler signal DP/PT/Peroneal Palpable left popliteal pulse Skin: left heel fissure without change  Assessment/Plan:  This is a 60 y.o. female who is s/p: Surgeon:  [name] Procedure Performed:             1.  Ultrasound-guided access, right femoral artery             2.  Ultrasound-guided placement of a right common femoral vein central venous catheter             3.  Abdominal aortogram             4.  Bilateral lower extremity runoff             5.  Atherectomy, left superficial femoral artery             6.  Drug coated balloon angioplasty, left superficial femoral artery             7.  Conscious sedation (90 minutes)   She states she has had heel pain and continues to have heel pain.  She has a significant improvement in blood flow to her left LE.  She states she is on gabapentin 300 mg dose that  does not help her foot pain, but at a dose 5f 600 mg her pain was better.  She may was reduced to 300 mg due to decreased liver function.  She may benefit from a trial with Lyrica.    She wants to talk to her Primary care physician about her options and that the pain may be nerve pain.    She will f/u with Dr. Donnetta Hutching in 3 months for repeat ABI's and arterial Duplex.   Theda Sers EMMA [facility] PA-C [facility] [phone]  Clinic MD:  Pt seen and examined with Dr. Donnetta Hutching  I have examined the patient, reviewed and agree with above. The patient has easily palpable popliteal pulse and has biphasic Doppler flow with excellent signals at her foot. Still having pain of questionable etiology..Very complex medical patient. Do not feel this is related to ischemia. We'll see [place] again in 3 months for continued follow-up  Curt Jews, MD 11/20/2015 4:51 PM

## 2015-11-21 DIAGNOSIS — N186 End stage renal disease: Secondary | ICD-10-CM | POA: Diagnosis not present

## 2015-11-21 DIAGNOSIS — E118 Type 2 diabetes mellitus with unspecified complications: Secondary | ICD-10-CM | POA: Diagnosis not present

## 2015-11-21 DIAGNOSIS — D631 Anemia in chronic kidney disease: Secondary | ICD-10-CM | POA: Diagnosis not present

## 2015-11-21 DIAGNOSIS — I359 Nonrheumatic aortic valve disorder, unspecified: Secondary | ICD-10-CM | POA: Diagnosis not present

## 2015-11-21 DIAGNOSIS — E876 Hypokalemia: Secondary | ICD-10-CM | POA: Diagnosis not present

## 2015-11-21 LAB — PROTIME-INR: INR: 1.6 — AB (ref 0.9–1.1)

## 2015-11-23 ENCOUNTER — Ambulatory Visit (INDEPENDENT_AMBULATORY_CARE_PROVIDER_SITE_OTHER): Payer: Medicare Other | Admitting: Pharmacist Clinician (PhC)/ Clinical Pharmacy Specialist

## 2015-11-23 DIAGNOSIS — Z952 Presence of prosthetic heart valve: Secondary | ICD-10-CM

## 2015-11-23 DIAGNOSIS — Z7901 Long term (current) use of anticoagulants: Secondary | ICD-10-CM

## 2015-11-23 DIAGNOSIS — D631 Anemia in chronic kidney disease: Secondary | ICD-10-CM | POA: Diagnosis not present

## 2015-11-23 DIAGNOSIS — E876 Hypokalemia: Secondary | ICD-10-CM | POA: Diagnosis not present

## 2015-11-23 DIAGNOSIS — E118 Type 2 diabetes mellitus with unspecified complications: Secondary | ICD-10-CM | POA: Diagnosis not present

## 2015-11-23 DIAGNOSIS — N186 End stage renal disease: Secondary | ICD-10-CM | POA: Diagnosis not present

## 2015-11-26 ENCOUNTER — Emergency Department (HOSPITAL_COMMUNITY): Payer: Medicare Other

## 2015-11-26 ENCOUNTER — Inpatient Hospital Stay (HOSPITAL_COMMUNITY)
Admission: EM | Admit: 2015-11-26 | Discharge: 2015-11-29 | DRG: 640 | Disposition: A | Discharge status: Home / Self Care | Payer: Medicare Other | Attending: Internal Medicine | Admitting: Internal Medicine

## 2015-11-26 ENCOUNTER — Encounter (HOSPITAL_COMMUNITY): Payer: Self-pay

## 2015-11-26 DIAGNOSIS — E861 Hypovolemia: Principal | ICD-10-CM | POA: Diagnosis present

## 2015-11-26 DIAGNOSIS — Z9081 Acquired absence of spleen: Secondary | ICD-10-CM

## 2015-11-26 DIAGNOSIS — I959 Hypotension, unspecified: Secondary | ICD-10-CM | POA: Diagnosis not present

## 2015-11-26 DIAGNOSIS — R9431 Abnormal electrocardiogram [ECG] [EKG]: Secondary | ICD-10-CM | POA: Diagnosis not present

## 2015-11-26 DIAGNOSIS — L8992 Pressure ulcer of unspecified site, stage 2: Secondary | ICD-10-CM | POA: Insufficient documentation

## 2015-11-26 DIAGNOSIS — N186 End stage renal disease: Secondary | ICD-10-CM | POA: Diagnosis not present

## 2015-11-26 DIAGNOSIS — I119 Hypertensive heart disease without heart failure: Secondary | ICD-10-CM | POA: Diagnosis present

## 2015-11-26 DIAGNOSIS — L8990 Pressure ulcer of unspecified site, unspecified stage: Secondary | ICD-10-CM | POA: Diagnosis present

## 2015-11-26 DIAGNOSIS — I120 Hypertensive chronic kidney disease with stage 5 chronic kidney disease or end stage renal disease: Secondary | ICD-10-CM | POA: Diagnosis not present

## 2015-11-26 DIAGNOSIS — Z944 Liver transplant status: Secondary | ICD-10-CM

## 2015-11-26 DIAGNOSIS — Z952 Presence of prosthetic heart valve: Secondary | ICD-10-CM

## 2015-11-26 DIAGNOSIS — D631 Anemia in chronic kidney disease: Secondary | ICD-10-CM | POA: Diagnosis not present

## 2015-11-26 DIAGNOSIS — E785 Hyperlipidemia, unspecified: Secondary | ICD-10-CM | POA: Diagnosis present

## 2015-11-26 DIAGNOSIS — R404 Transient alteration of awareness: Secondary | ICD-10-CM | POA: Diagnosis not present

## 2015-11-26 DIAGNOSIS — E1122 Type 2 diabetes mellitus with diabetic chronic kidney disease: Secondary | ICD-10-CM | POA: Diagnosis not present

## 2015-11-26 DIAGNOSIS — S0990XA Unspecified injury of head, initial encounter: Secondary | ICD-10-CM | POA: Diagnosis not present

## 2015-11-26 DIAGNOSIS — R55 Syncope and collapse: Secondary | ICD-10-CM | POA: Diagnosis present

## 2015-11-26 DIAGNOSIS — J449 Chronic obstructive pulmonary disease, unspecified: Secondary | ICD-10-CM | POA: Diagnosis present

## 2015-11-26 DIAGNOSIS — R945 Abnormal results of liver function studies: Secondary | ICD-10-CM | POA: Diagnosis present

## 2015-11-26 DIAGNOSIS — D649 Anemia, unspecified: Secondary | ICD-10-CM | POA: Diagnosis present

## 2015-11-26 DIAGNOSIS — S199XXA Unspecified injury of neck, initial encounter: Secondary | ICD-10-CM | POA: Diagnosis not present

## 2015-11-26 DIAGNOSIS — I2510 Atherosclerotic heart disease of native coronary artery without angina pectoris: Secondary | ICD-10-CM | POA: Diagnosis present

## 2015-11-26 DIAGNOSIS — F17210 Nicotine dependence, cigarettes, uncomplicated: Secondary | ICD-10-CM | POA: Diagnosis present

## 2015-11-26 DIAGNOSIS — Z833 Family history of diabetes mellitus: Secondary | ICD-10-CM

## 2015-11-26 DIAGNOSIS — Z823 Family history of stroke: Secondary | ICD-10-CM

## 2015-11-26 DIAGNOSIS — E876 Hypokalemia: Secondary | ICD-10-CM | POA: Diagnosis not present

## 2015-11-26 DIAGNOSIS — R7989 Other specified abnormal findings of blood chemistry: Secondary | ICD-10-CM

## 2015-11-26 DIAGNOSIS — Z992 Dependence on renal dialysis: Secondary | ICD-10-CM

## 2015-11-26 DIAGNOSIS — E039 Hypothyroidism, unspecified: Secondary | ICD-10-CM | POA: Diagnosis present

## 2015-11-26 DIAGNOSIS — K219 Gastro-esophageal reflux disease without esophagitis: Secondary | ICD-10-CM | POA: Diagnosis present

## 2015-11-26 DIAGNOSIS — Z905 Acquired absence of kidney: Secondary | ICD-10-CM

## 2015-11-26 DIAGNOSIS — E1151 Type 2 diabetes mellitus with diabetic peripheral angiopathy without gangrene: Secondary | ICD-10-CM | POA: Diagnosis present

## 2015-11-26 DIAGNOSIS — Z7982 Long term (current) use of aspirin: Secondary | ICD-10-CM

## 2015-11-26 DIAGNOSIS — Z79899 Other long term (current) drug therapy: Secondary | ICD-10-CM

## 2015-11-26 DIAGNOSIS — E118 Type 2 diabetes mellitus with unspecified complications: Secondary | ICD-10-CM | POA: Diagnosis not present

## 2015-11-26 DIAGNOSIS — Z7901 Long term (current) use of anticoagulants: Secondary | ICD-10-CM

## 2015-11-26 DIAGNOSIS — Z951 Presence of aortocoronary bypass graft: Secondary | ICD-10-CM

## 2015-11-26 DIAGNOSIS — Z8249 Family history of ischemic heart disease and other diseases of the circulatory system: Secondary | ICD-10-CM

## 2015-11-26 LAB — COMPREHENSIVE METABOLIC PANEL
ALK PHOS: 432 U/L — AB (ref 38–126)
ALT: 188 U/L — AB (ref 14–54)
AST: 355 U/L — ABNORMAL HIGH (ref 15–41)
Albumin: 3.3 g/dL — ABNORMAL LOW (ref 3.5–5.0)
Anion gap: 11 (ref 5–15)
BUN: 7 mg/dL (ref 6–20)
CALCIUM: 9.2 mg/dL (ref 8.9–10.3)
CHLORIDE: 97 mmol/L — AB (ref 101–111)
CO2: 32 mmol/L (ref 22–32)
CREATININE: 3.7 mg/dL — AB (ref 0.44–1.00)
GFR, EST AFRICAN AMERICAN: 14 mL/min — AB (ref >60)
GFR, EST NON AFRICAN AMERICAN: 12 mL/min — AB (ref >60)
Glucose, Bld: 96 mg/dL (ref 65–99)
Potassium: 4.4 mmol/L (ref 3.5–5.1)
Sodium: 140 mmol/L (ref 135–145)
Total Bilirubin: 0.7 mg/dL (ref 0.3–1.2)
Total Protein: 8.6 g/dL — ABNORMAL HIGH (ref 6.5–8.1)

## 2015-11-26 LAB — PROTIME-INR
INR: 1.79
Prothrombin Time: 21 seconds — ABNORMAL HIGH (ref 11.4–15.2)

## 2015-11-26 LAB — CBC
HCT: 34.9 % — ABNORMAL LOW (ref 36.0–46.0)
Hemoglobin: 11.5 g/dL — ABNORMAL LOW (ref 12.0–15.0)
MCH: 30.9 pg (ref 26.0–34.0)
MCHC: 33 g/dL (ref 30.0–36.0)
MCV: 93.8 fL (ref 78.0–100.0)
PLATELETS: 238 10*3/uL (ref 150–400)
RBC: 3.72 MIL/uL — AB (ref 3.87–5.11)
RDW: 18.6 % — ABNORMAL HIGH (ref 11.5–15.5)
WBC: 9.2 10*3/uL (ref 4.0–10.5)

## 2015-11-26 LAB — I-STAT TROPONIN, ED
TROPONIN I, POC: 0.02 ng/mL (ref 0.00–0.08)
Troponin i, poc: 0.04 ng/mL (ref 0.00–0.08)

## 2015-11-26 LAB — AMMONIA: Ammonia: 53 umol/L — ABNORMAL HIGH (ref 9–35)

## 2015-11-26 LAB — MRSA PCR SCREENING: MRSA BY PCR: NEGATIVE

## 2015-11-26 MED ORDER — TRAMADOL HCL 50 MG PO TABS
50.0000 mg | ORAL_TABLET | Freq: Four times a day (QID) | ORAL | Status: DC | PRN
  Administered 2015-11-26 – 2015-11-27 (×2): 50 mg via ORAL
  Filled 2015-11-26 (×2): qty 1

## 2015-11-26 MED ORDER — TACROLIMUS 1 MG PO CAPS
1.0000 mg | ORAL_CAPSULE | Freq: Every day | ORAL | Status: DC
  Administered 2015-11-26 – 2015-11-28 (×3): 1 mg via ORAL
  Filled 2015-11-26 (×3): qty 1

## 2015-11-26 MED ORDER — LEVOTHYROXINE SODIUM 150 MCG PO TABS
150.0000 ug | ORAL_TABLET | Freq: Every day | ORAL | Status: DC
  Administered 2015-11-27: 150 ug via ORAL
  Filled 2015-11-26: qty 2
  Filled 2015-11-26 (×2): qty 1

## 2015-11-26 MED ORDER — NITROGLYCERIN 0.4 MG SL SUBL: 0.4000 mg | SUBLINGUAL_TABLET | SUBLINGUAL | Status: DC | PRN

## 2015-11-26 MED ORDER — SENNOSIDES-DOCUSATE SODIUM 8.6-50 MG PO TABS
1.0000 | ORAL_TABLET | Freq: Every day | ORAL | Status: DC
  Administered 2015-11-26 – 2015-11-29 (×4): 1 via ORAL
  Filled 2015-11-26 (×4): qty 1

## 2015-11-26 MED ORDER — HEPARIN (PORCINE) IN NACL 100-0.45 UNIT/ML-% IJ SOLN
900.0000 [IU]/h | INTRAMUSCULAR | Status: DC
  Administered 2015-11-26: 800 [IU]/h via INTRAVENOUS
  Administered 2015-11-27 – 2015-11-29 (×2): 900 [IU]/h via INTRAVENOUS
  Filled 2015-11-26 (×4): qty 250

## 2015-11-26 MED ORDER — SODIUM CHLORIDE 0.9% FLUSH
3.0000 mL | Freq: Two times a day (BID) | INTRAVENOUS | Status: DC
  Administered 2015-11-28 – 2015-11-29 (×2): 3 mL via INTRAVENOUS

## 2015-11-26 MED ORDER — GABAPENTIN 300 MG PO CAPS
300.0000 mg | ORAL_CAPSULE | Freq: Every evening | ORAL | Status: DC
  Administered 2015-11-26 – 2015-11-28 (×3): 300 mg via ORAL
  Filled 2015-11-26 (×3): qty 1

## 2015-11-26 MED ORDER — ONDANSETRON 4 MG PO TBDP: 4.0000 mg | ORAL_TABLET | Freq: Three times a day (TID) | ORAL | Status: DC | PRN

## 2015-11-26 MED ORDER — HEPARIN BOLUS VIA INFUSION
3000.0000 [IU] | Freq: Once | INTRAVENOUS | Status: AC
  Administered 2015-11-26: 3000 [IU] via INTRAVENOUS
  Filled 2015-11-26: qty 3000

## 2015-11-26 MED ORDER — ONDANSETRON HCL 4 MG/2ML IJ SOLN
4.0000 mg | Freq: Four times a day (QID) | INTRAMUSCULAR | Status: DC | PRN
  Administered 2015-11-27: 4 mg via INTRAVENOUS
  Filled 2015-11-26: qty 2

## 2015-11-26 MED ORDER — METOPROLOL TARTRATE 12.5 MG HALF TABLET
12.5000 mg | ORAL_TABLET | Freq: Two times a day (BID) | ORAL | Status: DC
  Administered 2015-11-26 – 2015-11-29 (×6): 12.5 mg via ORAL
  Filled 2015-11-26 (×6): qty 1

## 2015-11-26 MED ORDER — ASPIRIN EC 81 MG PO TBEC
81.0000 mg | DELAYED_RELEASE_TABLET | Freq: Every day | ORAL | Status: DC
  Administered 2015-11-26 – 2015-11-29 (×4): 81 mg via ORAL
  Filled 2015-11-26 (×4): qty 1

## 2015-11-26 MED ORDER — TACROLIMUS 1 MG PO CAPS
2.0000 mg | ORAL_CAPSULE | Freq: Every day | ORAL | Status: DC
  Administered 2015-11-27 – 2015-11-29 (×3): 2 mg via ORAL
  Filled 2015-11-26 (×3): qty 2

## 2015-11-26 MED ORDER — CALCIUM ACETATE (PHOS BINDER) 667 MG PO CAPS
667.0000 mg | ORAL_CAPSULE | Freq: Three times a day (TID) | ORAL | Status: DC
  Administered 2015-11-27 – 2015-11-29 (×7): 667 mg via ORAL
  Filled 2015-11-26 (×7): qty 1

## 2015-11-26 MED ORDER — HYDROMORPHONE HCL 1 MG/ML IJ SOLN: 0.5000 mg | INTRAMUSCULAR | Status: DC | PRN

## 2015-11-26 MED ORDER — ONDANSETRON HCL 4 MG PO TABS: 4.0000 mg | ORAL_TABLET | Freq: Four times a day (QID) | ORAL | Status: DC | PRN

## 2015-11-26 MED ORDER — IBUPROFEN 400 MG PO TABS
400.0000 mg | ORAL_TABLET | Freq: Once | ORAL | Status: AC
  Administered 2015-11-26: 400 mg via ORAL
  Filled 2015-11-26: qty 1

## 2015-11-26 MED ORDER — HEPARIN SODIUM (PORCINE) 5000 UNIT/ML IJ SOLN: 5000.0000 [IU] | Freq: Three times a day (TID) | INTRAMUSCULAR | Status: DC

## 2015-11-26 MED ORDER — TACROLIMUS 1 MG PO CAPS: 1.0000 mg | ORAL_CAPSULE | Freq: Two times a day (BID) | ORAL | Status: DC

## 2015-11-26 NOTE — ED Triage Notes (Signed)
Per [facility]: Dialysis pt, M, W, F, pt did get her treatment today. Pt was standing in the kitchen, called for help, sat in the chair, hit her head on the chair and was "unconscious for 2-3 minutes". Pt states that she "feels sleepy" and is complaining of posterior head pain. Pts current dialysis graft is in her leg. Can use either arm for BP and IVs.

## 2015-11-26 NOTE — ED Notes (Signed)
Tatyana PA at bedside   

## 2015-11-26 NOTE — Progress Notes (Signed)
Patient arrived to unit via stretcher.  Patient is alert and oriented and educated on the room, call bell, and phone.  Patient complaining of a headache and pain in the ulcer on her foot.  MD paged for prn pain medication.

## 2015-11-26 NOTE — Progress Notes (Signed)
ANTICOAGULATION CONSULT NOTE - Initial Consult  Pharmacy Consult for heparin Indication: Chronic anticoagulation (Dosed as Afib)  Allergies  Allergen Reactions  . Acetaminophen Other (See Comments)    Liver transplant recipient   . Codeine Itching  . Mirtazapine Other (See Comments)    hallucination  . Morphine Itching and Other (See Comments)    hallucinate    Patient Measurements: Height: 5\' 4"  (162.6 cm) Weight: 125 lb (56.7 kg) IBW/kg (Calculated) : 54.7 Heparin Dosing Weight: 56.7 kg  Vital Signs: BP: 125/48 (10/16 1800) Pulse Rate: 77 (10/16 1800)  Labs:  Recent Labs  11/26/15 1322  HGB 11.5*  HCT 34.9*  PLT 238  LABPROT 21.0*  INR 1.79  CREATININE 3.70*    Estimated Creatinine Clearance: 13.8 mL/min (by C-G formula based on SCr of 3.7 mg/dL (H)). ESRD dialysis MWF  Medical History: Past Medical History:  Diagnosis Date  . Anemia    takes Folic Acid daily  . Anxiety   . Aortic stenosis   . Arthritis    "left hand, back" (08/30/2012)  . Asthma   . CAD (coronary artery disease)   . CAD (coronary artery disease) Jan. 2015   Cath: 20% LAD, 50% D1; s/p LIMA-LAD  . Chronic back pain   . Chronic bronchitis ([place])    "q yr w/season changes" (08/30/2012)  . Chronic constipation    takes MIralax and Colace daily  . COPD (chronic obstructive pulmonary disease) ([place])   . Depression    takes Cymbalta for "severe" depression  . End stage renal disease on dialysis [facility]) 02/27/2011   Kidneys shut down at time of liver transplant in Sept 2011 at [facility] in [place], she has been on HD ever since.  Dialyzes at [facility] HD on TTS schedule.  Had L forearm graft used 10 months then removed Dec 2012 due to suspected infection.  A right upper arm AVG was placed Dec 2012 but she developed steal symptoms acutely and it was ligated the same day.  Never had an AV fistula due to small veins.  Now has L thigh AVG put in Jan 2013, has not clotted to date.    [name] GERD  (gastroesophageal reflux disease)    takes Omeprazole daily  . Headache    "at least monthly" (08/30/2012)  . Hepatitis C   . History of blood transfusion    "several" (08/30/2012)  . Hypertension    takes Metoprolol and Lisinopril daily, sees Dr Bea Graff  . Hypothyroidism    takes Synthroid daily  . Migraine    "last migraine was in 2013" (08/30/2012)  . Neuromuscular disorder ([place])    carpal tunnel in right hand  . Obesity   . Peripheral vascular disease (HCC) hands and legs  . Pneumonia    "today and several times before" (08/30/2012)  . 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 SVT (supraventricular tachycardia) ([place]) 06/09/14  . Tobacco abuse   . Type II diabetes mellitus (HCC)    Levemir 2units daily if > 150    Medications:   (Not in a hospital admission)  Assessment: Pt anticoagulated to goal 2.5-3 s/p aortic valve placement. On PTA warfarin. INR 1.79 today. H/H low 11.4/34.9, Plt WNL.  Goal of Therapy:  Heparin level 0.3-0.7 units/ml Monitor platelets by anticoagulation protocol: Yes   Plan:  Heparin 3000 units IV bolus x1 then 800 units/hr Heparin level at 0300 Daily CBC and heparin level  Cheral Almas, PharmD Candidate 11/26/2015,6:35 PM

## 2015-11-26 NOTE — ED Notes (Signed)
Attempted an IV 2 times without success.

## 2015-11-26 NOTE — ED Notes (Signed)
Attempted report x1. 

## 2015-11-26 NOTE — ED Notes (Signed)
Patient has returned from being out of the department; patient placed back on monitor, continuous pulse oximetry and blood pressure cuff; visitors at bedside
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
## 2015-11-26 NOTE — H&P (Signed)
[facility] History and Physical   Patient: [name] [id number]   PCP: No PCP Per Patient DOB: 1955-04-28   DOA: 11/26/2015   DOS: 11/26/2015   DOS: the patient was seen and examined on 11/26/2015  Patient coming from: The patient is coming from home.  Chief Complaint: Passing out episode  HPI: [name] is a 60 y.o. female with Past medical history of ESRD on HD, liver transplant, nephrectomy, splenectomy, CAD, mechanical aortic valve, chronic Coumadin. Patient presented with complaints of syncopal episode. Patient was at her baseline in the morning, went for hemodialysis. Her hemodialysis was also uneventful. After the hemodialysis patient Home and was fixing her lunch and felt dizzy and lightheaded. She had to sit down in the chair and called her daughter. She may have passed out for a few seconds and was awakened by her daughter. EMS was called and they found that patient's blood pressure was in 60. Patient did not have any chest pain but did have some shortness of breath. At the time of my evaluation patient continues to have dizziness, denies any focal deficit, room spinning sensation, chest pain, abdominal pain, shortness of breath, nausea or vomiting, denies any diarrhea or constipation. Denies any alcohol abuse, denies any use of Tylenol. Denies any recent change in medications.  ED Course: Orthostatic vitals were negative. Patient's blood pressure improved on its own. LFTs were elevated. ER consulted with patient's transplant surgeon hospital [facility] recommended to monitor LFT and recheck in the morning. Patient preferred and requested to be admitted at [facility] only.  At her baseline ambulates with support And is independent for most of her ADL; manages her medication on her own.  Review of Systems: as mentioned in the history of present illness.  All other systems reviewed and are negative.  Past Medical History:  Diagnosis Date  . Anemia    takes Folic Acid daily  . Anxiety   . Aortic stenosis   . Arthritis    "left hand, back" (08/30/2012)  . Asthma   . CAD (coronary artery disease)   . CAD (coronary artery disease) Jan. 2015   Cath: 20% LAD, 50% D1; s/p LIMA-LAD  . Chronic back pain   . Chronic bronchitis ([place])    "q yr w/season changes" (08/30/2012)  . Chronic constipation    takes MIralax and Colace daily  . COPD (chronic obstructive pulmonary disease) ([place])   . Depression    takes Cymbalta for "severe" depression  . End stage renal disease on dialysis [facility]) 02/27/2011   Kidneys shut down at time of liver transplant in Sept 2011 at [facility] in [place], she has been on HD ever since.  Dialyzes at [facility] HD on TTS schedule.  Had L forearm graft used 10 months then removed Dec 2012 due to suspected infection.  A right upper arm AVG was placed Dec 2012 but she developed steal symptoms acutely and it was ligated the same day.  Never had an AV fistula due to small veins.  Now has L thigh AVG put in Jan 2013, has not clotted to date.    [name] GERD (gastroesophageal reflux disease)    takes Omeprazole daily  . Headache    "at least monthly" (08/30/2012)  . Hepatitis C   . History of blood transfusion    "several" (08/30/2012)  . Hypertension    takes Metoprolol and Lisinopril daily, sees Dr Bea Graff  . Hypothyroidism    takes Synthroid daily  . Migraine    "  last migraine was in 2013" (08/30/2012)  . Neuromuscular disorder ([place])    carpal tunnel in right hand  . Obesity   . Peripheral vascular disease (HCC) hands and legs  . Pneumonia    "today and several times before" (08/30/2012)  . 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 SVT (supraventricular tachycardia) ([place]) 06/09/14  . Tobacco abuse   . Type II diabetes mellitus (HCC)    Levemir 2units daily if > 150   Past Surgical History:    Procedure Laterality Date  . AORTIC VALVE REPLACEMENT N/A 03/15/2013   AVR; Surgeon: Ivin Poot, MD;  Location: [facility] OR; Open Heart Surgery;  75mm[employer] mechanical prosthesis, top hat valve  . ARTERIOVENOUS GRAFT PLACEMENT Left 10/03/10    forearm  . AV FISTULA PLACEMENT  01/29/2011   Procedure: INSERTION OF ARTERIOVENOUS (AV) GORE-TEX GRAFT ARM;  Surgeon: Elam Dutch, MD;  Location: [facility] OR;  Service: Vascular;  Laterality: Right;  . AV FISTULA PLACEMENT  03/10/2011   Procedure: INSERTION OF ARTERIOVENOUS (AV) GORE-TEX GRAFT THIGH;  Surgeon: Elam Dutch, MD;  Location: [place];  Service: Vascular;  Laterality: Left;  . [place] REMOVAL  12/23/2010   Procedure: REMOVAL OF ARTERIOVENOUS GORETEX GRAFT ([place]);  Surgeon: Elam Dutch, MD;  Location: [place];  Service: Vascular;  Laterality: Left;  procedure started @1736 -1852  . CHOLECYSTECTOMY  1993  . CORONARY ARTERY BYPASS GRAFT N/A 03/15/2013   Procedure: CORONARY ARTERY BYPASS GRAFTING (CABG) times one using left internal mammary artery.;  Surgeon: Ivin Poot, MD;  Location: [place];  Service: Open Heart Surgery;  Laterality: N/A;  POSS CABG X 1  . CYSTOSCOPY  1990's  . INSERTION OF DIALYSIS CATHETER  12/23/2010   Procedure: INSERTION OF DIALYSIS CATHETER;  Surgeon: Elam Dutch, MD;  Location: [place];  Service: Vascular;  Laterality: Right;  Right Internal Jugular 28cm dialysis catheter insertion procedure time 1701-1720   . INTRAOPERATIVE TRANSESOPHAGEAL ECHOCARDIOGRAM N/A 03/15/2013   Procedure: INTRAOPERATIVE TRANSESOPHAGEAL ECHOCARDIOGRAM;  Surgeon: Ivin Poot, MD;  Location: [place];  Service: Open Heart Surgery;  Laterality: N/A;  . LEFT HEART CATHETERIZATION WITH CORONARY ANGIOGRAM N/A 07/29/2012   Procedure: LEFT HEART CATHETERIZATION WITH CORONARY ANGIOGRAM;  Surgeon: Troy Sine, MD;  Location: [facility] CATH LAB;  Service: Cardiovascular;  Laterality: N/A;  . LEFT HEART CATHETERIZATION WITH CORONARY ANGIOGRAM N/A  03/10/2013   Procedure: LEFT HEART CATHETERIZATION WITH CORONARY ANGIOGRAM;  Surgeon: Troy Sine, MD;  Location: [facility] CATH LAB;  Service: Cardiovascular;  Laterality: N/A;  . LEFT HEART CATHETERIZATION WITH CORONARY/GRAFT ANGIOGRAM N/A 12/24/2011   Procedure: LEFT HEART CATHETERIZATION WITH Beatrix Fetters;  Surgeon: [name], MD;  Location: [facility] CATH LAB;  Service: Cardiovascular;  Laterality: N/A;  . LEFT HEART CATHETERIZATION WITH CORONARY/GRAFT ANGIOGRAM N/A 12/16/2013   Procedure: LEFT HEART CATHETERIZATION WITH Beatrix Fetters;  Surgeon: Troy Sine, MD;  Location: [facility] CATH LAB;  Service: Cardiovascular;  Laterality: N/A;  . LIVER TRANSPLANT  10/25/2009   sees Dr Ferol Luz 1 every 6 months, saw last in Dec 2013. [name] Coord [phone]  . PERIPHERAL VASCULAR CATHETERIZATION N/A 11/06/2015   Procedure: Abdominal Aortogram;  Surgeon: Serafina Mitchell, MD;  Location: [place] CV LAB;  Service: Cardiovascular;  Laterality: N/A;  . PERIPHERAL VASCULAR CATHETERIZATION N/A 11/06/2015  Procedure: Lower Extremity Angiography;  Surgeon: Serafina Mitchell, MD;  Location: [place] CV LAB;  Service: Cardiovascular;  Laterality: N/A;  . PERIPHERAL VASCULAR CATHETERIZATION  11/06/2015   Procedure: Peripheral Vascular Atherectomy;  Surgeon: Serafina Mitchell, MD;  Location: [place] CV LAB;  Service: Cardiovascular;;  Left Superficial femoral  . SHUNTOGRAM Left 05/15/2014   Procedure: SHUNTOGRAM;  Surgeon: Conrad [place], MD;  Location: [facility] CATH LAB;  Service: Cardiovascular;  Laterality: Left;  . SMALL INTESTINE SURGERY  90's  . SPINAL GROWTH RODS  2010   "put 2 metal rods in my back; they had detetriorated" (08/30/2012)  . THROMBECTOMY    . THROMBECTOMY AND REVISION OF ARTERIOVENTOUS (AV) GORETEX  GRAFT Left 03/30/2014  . THROMBECTOMY AND REVISION OF ARTERIOVENTOUS (AV) GORETEX  GRAFT Left 03/30/2014   Procedure: THROMBECTOMY AND REVISION OF ARTERIOVENTOUS (AV) GORETEX   GRAFT;  Surgeon: Conrad [place], MD;  Location: [place];  Service: Vascular;  Laterality: Left;  . TUBAL LIGATION  1990's   Social History:  reports that she has been smoking Cigarettes.  She has a 40.00 pack-year smoking history. She has never used smokeless tobacco. She reports that she does not drink alcohol or use drugs.  Allergies  Allergen Reactions  . Acetaminophen Other (See Comments)    Liver transplant recipient   . Codeine Itching  . Mirtazapine Other (See Comments)    hallucination  . Morphine Itching and Other (See Comments)    hallucinate   Family History  Problem Relation Age of Onset  . Cancer Mother   . Diabetes Mother   . Hypertension Mother   . Stroke Mother   . Cancer Father   . Anesthesia problems Neg Hx   . Hypotension Neg Hx   . Malignant hyperthermia Neg Hx   . Pseudochol deficiency Neg Hx      Prior to Admission medications   Medication Sig Start Date End Date Taking? Authorizing Provider  aspirin EC 81 MG tablet Take 81 mg by mouth daily.   Yes Historical Provider, MD  calcium acetate (PHOSLO) 667 MG capsule Take 1 capsule (667 mg total) by mouth 3 (three) times daily with meals. 10/16/15  Yes Orson Eva, MD  gabapentin (NEURONTIN) 300 MG capsule Take 300 mg by mouth every evening.   Yes Historical Provider, MD  levothyroxine (SYNTHROID, LEVOTHROID) 150 MCG tablet Take 150 mcg by mouth daily before breakfast.   Yes Historical Provider, MD  metoprolol tartrate (LOPRESSOR) 25 MG tablet Take 12.5 mg by mouth 2 (two) times daily.  03/16/14  Yes Historical Provider, MD  ondansetron (ZOFRAN ODT) 4 MG disintegrating tablet Take 1 tablet (4 mg total) by mouth every 8 (eight) hours as needed for nausea. 08/13/15  Yes Tanna Furry, MD  senna-docusate (SENOKOT-S) 8.6-50 MG tablet Take 1 tablet by mouth daily.   Yes Historical Provider, MD  tacrolimus (PROGRAF) 1 MG capsule Take 1-2 mg by mouth 2 (two) times daily. Take 2 capsules in the morning, and 1 capsule in the evening    Yes Historical Provider, MD  warfarin (COUMADIN) 2 MG tablet Take 1- 2 tablets by mouth daily or as directed by coumadin clinic 11/01/15  Yes Troy Sine, MD  warfarin (COUMADIN) 2 MG tablet Take 2-4 mg by mouth See admin instructions. 2 mg every day except on Friday patient takes 4 mg   Yes Historical Provider, MD  nitroGLYCERIN (NITROSTAT) 0.4 MG SL tablet Place 1 tablet (0.4 mg total) under the tongue every 5 (  five) minutes as needed for chest pain. 03/20/14   Albertine Patricia, MD    Physical Exam: Vitals:   11/26/15 1530 11/26/15 1600 11/26/15 1630 11/26/15 1700  BP: 154/55 (!) 137/44 (!) 115/42 (!) 124/46  Pulse:      Resp: 16 15 16 14   SpO2:    100%  Weight:      Height:        General: Alert, Awake and Oriented to Time, Place and Person. Appear in mild distress, affect appropriate Eyes: PERRL, Conjunctiva normal ENT: Oral Mucosa clear moist Neck: difficult to assess JVD, no Abnormal Mass Or lumps Cardiovascular: S1 and S2 Present, aortic systolic Murmur, Peripheral Pulses Present Respiratory: Bilateral Air entry equal and Decreased, no use of accessory muscle, Clear to Auscultation, no Crackles, no wheezes Abdomen: Bowel Sound present, Soft and no tenderness Skin: no redness, no Rash, no induration Extremities: no Pedal edema, no calf tenderness Neurologic: Grossly no focal neuro deficit. Bilaterally Equal motor strength  Labs on Admission:  CBC:  Recent Labs Lab 11/26/15 1322  WBC 9.2  HGB 11.5*  HCT 34.9*  MCV 93.8  PLT 99991111   Basic Metabolic Panel:  Recent Labs Lab 11/26/15 1322  NA 140  K 4.4  CL 97*  CO2 32  GLUCOSE 96  BUN 7  CREATININE 3.70*  CALCIUM 9.2   GFR: Estimated Creatinine Clearance: 13.8 mL/min (by C-G formula based on SCr of 3.7 mg/dL (H)). Liver Function Tests:  Recent Labs Lab 11/26/15 1322  AST 355*  ALT 188*  ALKPHOS 432*  BILITOT 0.7  PROT 8.6*  ALBUMIN 3.3*   No results for input(s): LIPASE, AMYLASE in the last 168  hours.  Recent Labs Lab 11/26/15 1510  AMMONIA 53*   Coagulation Profile:  Recent Labs Lab 11/21/15 11/26/15 1322  INR 1.6* 1.79   Cardiac Enzymes: No results for input(s): CKTOTAL, CKMB, CKMBINDEX, TROPONINI in the last 168 hours. BNP (last 3 results) No results for input(s): PROBNP in the last 8760 hours. HbA1C: No results for input(s): HGBA1C in the last 72 hours. CBG: No results for input(s): GLUCAP in the last 168 hours. Lipid Profile: No results for input(s): CHOL, HDL, LDLCALC, TRIG, CHOLHDL, LDLDIRECT in the last 72 hours. Thyroid Function Tests: No results for input(s): TSH, T4TOTAL, FREET4, T3FREE, THYROIDAB in the last 72 hours. Anemia Panel: No results for input(s): VITAMINB12, FOLATE, FERRITIN, TIBC, IRON, RETICCTPCT in the last 72 hours. Urine analysis:    Component Value Date/Time   COLORURINE YELLOW 11/02/2012 0428   APPEARANCEUR CLOUDY (A) 11/02/2012 0428   LABSPEC 1.011 11/02/2012 0428   PHURINE 8.0 11/02/2012 0428   GLUCOSEU 250 (A) 11/02/2012 0428   HGBUR LARGE (A) 11/02/2012 0428   BILIRUBINUR NEGATIVE 11/02/2012 0428   KETONESUR NEGATIVE 11/02/2012 0428   PROTEINUR >300 (A) 11/02/2012 0428   UROBILINOGEN 0.2 11/02/2012 0428   NITRITE NEGATIVE 11/02/2012 0428   LEUKOCYTESUR NEGATIVE 11/02/2012 0428    Radiological Exams on Admission: Dg Chest 2 View  Result Date: 11/26/2015 CLINICAL DATA:  Patient with syncope status post dialysis treatment. EXAM: CHEST  2 VIEW COMPARISON:  Chest radiograph 09/19/2015. FINDINGS: Stable cardiac and mediastinal contours status post median sternotomy. Aortic vascular calcifications. No consolidative pulmonary opacities. No pleural effusion or pneumothorax. Thoracic spine degenerative changes. Upper abdominal surgical clips. IMPRESSION: No active cardiopulmonary disease. Electronically Signed   By: Lovey Newcomer M.D.   On: 11/26/2015 14:05   Ct Head Wo Contrast  Result Date: 11/26/2015 CLINICAL DATA:  Syncope  with  fall EXAM: CT HEAD WITHOUT CONTRAST CT CERVICAL SPINE WITHOUT CONTRAST TECHNIQUE: Multidetector CT imaging of the head and cervical spine was performed following the standard protocol without intravenous contrast. Multiplanar CT image reconstructions of the cervical spine were also generated. COMPARISON:  CT head and CT cervical spine March 09, 2015 FINDINGS: CT HEAD FINDINGS Brain: Mild diffuse atrophy is stable. There is no intracranial mass hemorrhage, extra-axial fluid collection, or midline shift. There is slight small vessel disease in the centra semiovale bilaterally. Elsewhere gray-white compartments appear normal. No acute infarct evident. Calcification in the basal ganglia regions is felt to be physiologic. Vascular: There is no hyperdense vessel. There is calcification in carotid siphon regions bilaterally. Skull: The bony calvarium appears intact. Sinuses/Orbits: There is opacification in the left and right sphenoid sinuses. There is opacification in a posterior right ethmoid air cells. There is a bony defect in the right medial orbital wall, a stable finding that may either be congenital or residua of old trauma. Orbits otherwise appear symmetric bilaterally. Other:  Mastoid air cells are clear. CT CERVICAL SPINE FINDINGS Alignment: There is no spondylolisthesis. Skull base and vertebrae: Skull base and craniocervical junction regions appear normal. There is mild pannus posterior to the odontoid, not causing appreciable impression on the craniocervical junction. There is no demonstrable fracture. No blastic or lytic bone lesions are evident. Soft tissues and spinal canal: Prevertebral soft tissues and predental space regions are normal. No paraspinous lesion. No spinal stenosis evident. Disc levels: No appreciable disc space narrowing noted. There is a central disc extrusion at C5-6 which appears stable compared to the prior study. There is modest impression on the thecal sac ventrally at this site.  No other disc extrusion seen. There is slight facet hypertrophy at several levels without nerve root edema or effacement. Upper chest: Visualized lung apices are clear. Other: There is carotid artery calcification bilaterally. IMPRESSION: CT head: Stable mild atrophy with mild periventricular small vessel disease. No intracranial mass, hemorrhage, or extra-axial fluid collection. No acute infarct evident. Areas of paranasal sinus disease. Areas of arterial vascular calcification. CT cervical spine: No fracture or spondylolisthesis. Central disc extrusion at C5-6, stable. No frank spinal stenosis. There is carotid artery calcification bilaterally. Electronically Signed   By: Lowella Grip III M.D.   On: 11/26/2015 14:31   Ct Cervical Spine Wo Contrast  Result Date: 11/26/2015 CLINICAL DATA:  Syncope with fall EXAM: CT HEAD WITHOUT CONTRAST CT CERVICAL SPINE WITHOUT CONTRAST TECHNIQUE: Multidetector CT imaging of the head and cervical spine was performed following the standard protocol without intravenous contrast. Multiplanar CT image reconstructions of the cervical spine were also generated. COMPARISON:  CT head and CT cervical spine March 09, 2015 FINDINGS: CT HEAD FINDINGS Brain: Mild diffuse atrophy is stable. There is no intracranial mass hemorrhage, extra-axial fluid collection, or midline shift. There is slight small vessel disease in the centra semiovale bilaterally. Elsewhere gray-white compartments appear normal. No acute infarct evident. Calcification in the basal ganglia regions is felt to be physiologic. Vascular: There is no hyperdense vessel. There is calcification in carotid siphon regions bilaterally. Skull: The bony calvarium appears intact. Sinuses/Orbits: There is opacification in the left and right sphenoid sinuses. There is opacification in a posterior right ethmoid air cells. There is a bony defect in the right medial orbital wall, a stable finding that may either be congenital or  residua of old trauma. Orbits otherwise appear symmetric bilaterally. Other:  Mastoid air cells are clear. CT CERVICAL SPINE FINDINGS  Alignment: There is no spondylolisthesis. Skull base and vertebrae: Skull base and craniocervical junction regions appear normal. There is mild pannus posterior to the odontoid, not causing appreciable impression on the craniocervical junction. There is no demonstrable fracture. No blastic or lytic bone lesions are evident. Soft tissues and spinal canal: Prevertebral soft tissues and predental space regions are normal. No paraspinous lesion. No spinal stenosis evident. Disc levels: No appreciable disc space narrowing noted. There is a central disc extrusion at C5-6 which appears stable compared to the prior study. There is modest impression on the thecal sac ventrally at this site. No other disc extrusion seen. There is slight facet hypertrophy at several levels without nerve root edema or effacement. Upper chest: Visualized lung apices are clear. Other: There is carotid artery calcification bilaterally. IMPRESSION: CT head: Stable mild atrophy with mild periventricular small vessel disease. No intracranial mass, hemorrhage, or extra-axial fluid collection. No acute infarct evident. Areas of paranasal sinus disease. Areas of arterial vascular calcification. CT cervical spine: No fracture or spondylolisthesis. Central disc extrusion at C5-6, stable. No frank spinal stenosis. There is carotid artery calcification bilaterally. Electronically Signed   By: Lowella Grip III M.D.   On: 11/26/2015 14:31   EKG: Independently reviewed. normal sinus rhythm, nonspecific ST and T waves changes.  Assessment/Plan 1. Syncope Patient is presenting with a syncopal event. This is likely secondary to hypotension given after hemodialysis. Blood pressure currently stable. Orthostatics are negative. Patient remains mild lid dizzy. No focal deficit on examination. CT head unremarkable CT  C-spine unremarkable. We will continue to monitor the patient on telemetry. Recheck orthostatic vitals. No aggressive IV resuscitation given patient's hemodialysis status.  2. Elevated LFT. Patient's LFTs are significantly elevated in 100s. Patient denies any alcohol abuse, and need Tylenol use. Patient denies also any other recent change in medication and asymptomatic. Also discussed with transplant surgeon at [facility] who recommends to monitor and recheck the labs in the morning. If the LFTs are elevated patient may require further imaging. As well as may require further discussion with [facility] transplant center. My expectation is this will improve as this appears to be associated with hypotension. Avoid hepatotoxic medication.  3. Mechanical aortic valve. Chronic anticoagulation. Subtherapeutic INR. Patient presents with a fall and head injury. CT head C-spine are negative for any acute bleeding, examination is also negative for any acute abnormality. We will follow serial neuro checks. We'll start the patient on IV heparin for bridging while her INR is subtherapeutic.  4. History of liver transplant. Continue Prograf.  5. ESRD on him on dialysis. Original schedule Tuesday Thursday Saturday. Although Patient completed her hemodialysis session today, consult nephrology. The patient remains in the hospital for more than 24 hours for continuation of hemodialysis.  6. Coronary artery disease. Continue aspirin. With her dyslipidemia she is not on statin due to liver transplant.  7. Essential hypertension. Continuing home medication.  Nutrition: Renal diet DVT Prophylaxis: on therapeutic anticoagulation.  Advance goals of care discussion: Full code   Consults: None  Family Communication: no family was present at bedside, at the time of interview.  Disposition: Admitted as observation, telemetry unit. Likely to be discharged home, in 1 days.  Author: Berle Mull, MD [facility] Pager: [phone] 11/26/2015  If 7PM-7AM, please contact night-coverage [email or web address] Password [email or web address]

## 2015-11-26 NOTE — ED Provider Notes (Signed)
Emergency Ultrasound Study:   Angiocath insertion Performed by: Evonnie Pat Consent: Verbal consent/emergent consent obtained. Risks and benefits: risks, benefits and alternatives were discussed Immediately prior to procedure the correct patient, procedure, equipment, support staff and site/side marked as needed. Indication: difficult IV access Preparation: Patient was prepped and draped in the usual sterile fashion. Sterile gel was used for this procedure and the ultrasound probe was sterilized prior to use. Vein Location: Right superficial forearm vein was visualized during assessment for potential access sites and was found to be patent/ easily compressed with linear ultrasound.  The needle was visualized with real-time ultrasound and guided into the vein. Gauge: 20 Image saved and stored.  Normal blood return.   Patient tolerance: Patient tolerated the procedure well with no immediate complications.        Duffy Bruce, MD 11/26/15 [phone]

## 2015-11-27 DIAGNOSIS — Z7982 Long term (current) use of aspirin: Secondary | ICD-10-CM | POA: Diagnosis not present

## 2015-11-27 DIAGNOSIS — E039 Hypothyroidism, unspecified: Secondary | ICD-10-CM | POA: Diagnosis present

## 2015-11-27 DIAGNOSIS — Z951 Presence of aortocoronary bypass graft: Secondary | ICD-10-CM | POA: Diagnosis not present

## 2015-11-27 DIAGNOSIS — Z992 Dependence on renal dialysis: Secondary | ICD-10-CM | POA: Diagnosis not present

## 2015-11-27 DIAGNOSIS — E1151 Type 2 diabetes mellitus with diabetic peripheral angiopathy without gangrene: Secondary | ICD-10-CM | POA: Diagnosis present

## 2015-11-27 DIAGNOSIS — E1122 Type 2 diabetes mellitus with diabetic chronic kidney disease: Secondary | ICD-10-CM | POA: Diagnosis present

## 2015-11-27 DIAGNOSIS — Z9081 Acquired absence of spleen: Secondary | ICD-10-CM | POA: Diagnosis not present

## 2015-11-27 DIAGNOSIS — Z823 Family history of stroke: Secondary | ICD-10-CM | POA: Diagnosis not present

## 2015-11-27 DIAGNOSIS — L8992 Pressure ulcer of unspecified site, stage 2: Secondary | ICD-10-CM | POA: Insufficient documentation

## 2015-11-27 DIAGNOSIS — I951 Orthostatic hypotension: Secondary | ICD-10-CM

## 2015-11-27 DIAGNOSIS — I350 Nonrheumatic aortic (valve) stenosis: Secondary | ICD-10-CM | POA: Diagnosis not present

## 2015-11-27 DIAGNOSIS — F17210 Nicotine dependence, cigarettes, uncomplicated: Secondary | ICD-10-CM | POA: Diagnosis present

## 2015-11-27 DIAGNOSIS — R55 Syncope and collapse: Secondary | ICD-10-CM | POA: Diagnosis not present

## 2015-11-27 DIAGNOSIS — Z7901 Long term (current) use of anticoagulants: Secondary | ICD-10-CM | POA: Diagnosis not present

## 2015-11-27 DIAGNOSIS — E1121 Type 2 diabetes mellitus with diabetic nephropathy: Secondary | ICD-10-CM | POA: Diagnosis not present

## 2015-11-27 DIAGNOSIS — I2510 Atherosclerotic heart disease of native coronary artery without angina pectoris: Secondary | ICD-10-CM | POA: Diagnosis present

## 2015-11-27 DIAGNOSIS — I959 Hypotension, unspecified: Secondary | ICD-10-CM | POA: Diagnosis present

## 2015-11-27 DIAGNOSIS — J449 Chronic obstructive pulmonary disease, unspecified: Secondary | ICD-10-CM | POA: Diagnosis present

## 2015-11-27 DIAGNOSIS — I25708 Atherosclerosis of coronary artery bypass graft(s), unspecified, with other forms of angina pectoris: Secondary | ICD-10-CM | POA: Diagnosis not present

## 2015-11-27 DIAGNOSIS — I120 Hypertensive chronic kidney disease with stage 5 chronic kidney disease or end stage renal disease: Secondary | ICD-10-CM | POA: Diagnosis present

## 2015-11-27 DIAGNOSIS — Z952 Presence of prosthetic heart valve: Secondary | ICD-10-CM | POA: Diagnosis not present

## 2015-11-27 DIAGNOSIS — L8990 Pressure ulcer of unspecified site, unspecified stage: Secondary | ICD-10-CM | POA: Diagnosis present

## 2015-11-27 DIAGNOSIS — Z79899 Other long term (current) drug therapy: Secondary | ICD-10-CM | POA: Diagnosis not present

## 2015-11-27 DIAGNOSIS — D649 Anemia, unspecified: Secondary | ICD-10-CM | POA: Diagnosis present

## 2015-11-27 DIAGNOSIS — N186 End stage renal disease: Secondary | ICD-10-CM | POA: Diagnosis not present

## 2015-11-27 DIAGNOSIS — E785 Hyperlipidemia, unspecified: Secondary | ICD-10-CM | POA: Diagnosis present

## 2015-11-27 DIAGNOSIS — K219 Gastro-esophageal reflux disease without esophagitis: Secondary | ICD-10-CM | POA: Diagnosis present

## 2015-11-27 DIAGNOSIS — Z944 Liver transplant status: Secondary | ICD-10-CM | POA: Diagnosis not present

## 2015-11-27 DIAGNOSIS — E861 Hypovolemia: Secondary | ICD-10-CM | POA: Diagnosis present

## 2015-11-27 LAB — CBC
HCT: 30.8 % — ABNORMAL LOW (ref 36.0–46.0)
HEMOGLOBIN: 10 g/dL — AB (ref 12.0–15.0)
MCH: 29.8 pg (ref 26.0–34.0)
MCHC: 32.5 g/dL (ref 30.0–36.0)
MCV: 91.7 fL (ref 78.0–100.0)
Platelets: 223 10*3/uL (ref 150–400)
RBC: 3.36 MIL/uL — ABNORMAL LOW (ref 3.87–5.11)
RDW: 18.5 % — AB (ref 11.5–15.5)
WBC: 7.1 10*3/uL (ref 4.0–10.5)

## 2015-11-27 LAB — COMPREHENSIVE METABOLIC PANEL
ALBUMIN: 2.7 g/dL — AB (ref 3.5–5.0)
ALK PHOS: 387 U/L — AB (ref 38–126)
ALT: 164 U/L — AB (ref 14–54)
ANION GAP: 12 (ref 5–15)
AST: 347 U/L — AB (ref 15–41)
BILIRUBIN TOTAL: 0.6 mg/dL (ref 0.3–1.2)
BUN: 14 mg/dL (ref 6–20)
CALCIUM: 8.9 mg/dL (ref 8.9–10.3)
CO2: 29 mmol/L (ref 22–32)
Chloride: 98 mmol/L — ABNORMAL LOW (ref 101–111)
Creatinine, Ser: 4.88 mg/dL — ABNORMAL HIGH (ref 0.44–1.00)
GFR calc Af Amer: 10 mL/min — ABNORMAL LOW (ref >60)
GFR calc non Af Amer: 9 mL/min — ABNORMAL LOW (ref >60)
GLUCOSE: 104 mg/dL — AB (ref 65–99)
Potassium: 4.9 mmol/L (ref 3.5–5.1)
SODIUM: 139 mmol/L (ref 135–145)
TOTAL PROTEIN: 6.8 g/dL (ref 6.5–8.1)

## 2015-11-27 LAB — PROTIME-INR
INR: 2.14
PROTHROMBIN TIME: 24.2 s — AB (ref 11.4–15.2)

## 2015-11-27 LAB — GLUCOSE, CAPILLARY: Glucose-Capillary: 90 mg/dL (ref 65–99)

## 2015-11-27 LAB — HEPARIN LEVEL (UNFRACTIONATED)
HEPARIN UNFRACTIONATED: 0.28 [IU]/mL — AB (ref 0.30–0.70)
Heparin Unfractionated: 0.34 IU/mL (ref 0.30–0.70)

## 2015-11-27 MED ORDER — WARFARIN - PHARMACIST DOSING INPATIENT
Freq: Every day | Status: DC
  Administered 2015-11-27 – 2015-11-28 (×2)

## 2015-11-27 MED ORDER — WARFARIN SODIUM 2 MG PO TABS
2.0000 mg | ORAL_TABLET | Freq: Once | ORAL | Status: AC
  Administered 2015-11-27: 2 mg via ORAL
  Filled 2015-11-27: qty 1

## 2015-11-27 MED ORDER — LEVOTHYROXINE SODIUM 75 MCG PO TABS
150.0000 ug | ORAL_TABLET | Freq: Every day | ORAL | Status: DC
  Administered 2015-11-28 – 2015-11-29 (×2): 150 ug via ORAL
  Filled 2015-11-27 (×2): qty 2

## 2015-11-27 NOTE — Evaluation (Signed)
Physical Therapy Evaluation Patient Details Name: [name] MRN: [id number] DOB: 04-19-1955 Today's Date: 11/27/2015   History of Present Illness  [name] is a 60 y.o. female with Past medical history of ESRD on HD, liver transplant, nephrectomy, splenectomy, CAD, mechanical aortic valve, chronic Coumadin.  Patient presented with complaints of syncopal episode.  Patient was at her baseline in the morning, went for hemodialysis. Her hemodialysis was also uneventful. After the hemodialysis patient Home and was fixing her lunch and felt dizzy and lightheaded.  Clinical Impression  Pt admitted with above diagnosis. Pt currently with functional limitations due to the deficits listed below (see PT Problem List). Pt sat to EOB and immediately got sick - had to get trash can for pt to throw up in.  Notified nursing that pt threw up in trash can.  Gave pt an emesis basin.  Pt BP elevated as well and notified nursing of this as well. Laid pt back down due to vomiting. BP 166/64 with HR of 71bpm in supine and 179/48 with HR of 67 in sitting.  Will follow acutely as able.  Pt will benefit from skilled PT to increase their independence and safety with mobility to allow discharge to the venue listed below.      Follow Up Recommendations Home health PT;Supervision/Assistance - 24 hour (If no 24 hour care, SNF recommended)    Equipment Recommendations  None recommended by PT    Recommendations for Other Services OT consult     Precautions / Restrictions Precautions Precautions: Fall Restrictions Weight Bearing Restrictions: No      Mobility  Bed Mobility Overal bed mobility: Independent             General bed mobility comments: Once pt sat at EOB, c/o nausea.  PT got trash can just in time for pt to vomit several times into trashcan.  Checked BP and it was elevated therefore assisted pt back to supine and called nurse to let her know of session.    Transfers                  General transfer comment: Did not stand due to vomiiting.   Ambulation/Gait                Stairs            Wheelchair Mobility    Modified Rankin (Stroke Patients Only)       Balance Overall balance assessment: Needs assistance;History of Falls Sitting-balance support: No upper extremity supported;Feet supported Sitting balance-Leahy Scale: Good         Standing balance comment: unable to complete as pt vomiting.                              Pertinent Vitals/Pain Pain Assessment: No/denies pain  See VS above.    Home Living Family/patient expects to be discharged to:: Private residence Living Arrangements: Children Available Help at Discharge: Family;Available PRN/intermittently Type of Home: Mobile home Home Access: Stairs to enter Entrance Stairs-Rails: Right;Left;Can reach both Entrance Stairs-Number of Steps: 5 Home Layout: One level Home Equipment: Walker - 2 wheels;Cane - single point;Walker - 4 wheels;Bedside commode Additional Comments: Pt states daughter can be with her 24 hours day but chart says daughter checks on pt 2 x week.     Prior Function Level of Independence: Needs assistance   Gait / Transfers Assistance Needed: states she uses rollator  ADL's /  Homemaking Assistance Needed: pt states she can do her ADLs        Hand Dominance   Dominant Hand: Right    Extremity/Trunk Assessment   Upper Extremity Assessment: Defer to OT evaluation           Lower Extremity Assessment: Generalized weakness         Communication   Communication: No difficulties  Cognition Arousal/Alertness: Awake/alert Behavior During Therapy: Flat affect Overall Cognitive Status: Impaired/Different from baseline Area of Impairment: Safety/judgement;Awareness;Problem solving         Safety/Judgement: Decreased awareness of safety   Problem Solving: Decreased initiation;Difficulty sequencing      General Comments       Exercises     Assessment/Plan    PT Assessment Patient needs continued PT services  PT Problem List Decreased activity tolerance;Decreased balance;Decreased mobility;Decreased knowledge of use of DME;Decreased safety awareness;Decreased knowledge of precautions          PT Treatment Interventions Gait training;DME instruction;Functional mobility training;Therapeutic activities;Therapeutic exercise;Stair training;Balance training;Patient/family education    PT Goals (Current goals can be found in the Care Plan section)  Acute Rehab PT Goals Patient Stated Goal: to get better PT Goal Formulation: With patient Time For Goal Achievement: 12/11/15 Potential to Achieve Goals: Good    Frequency Min 3X/week   Barriers to discharge Decreased caregiver support (chart states daughter not 24 hours but pt says she can be)      Co-evaluation               End of Session   Activity Tolerance: Patient limited by fatigue (limited by vomiting.) Patient left: in bed;with call bell/phone within reach;with family/visitor present Nurse Communication: Other (comment) (pt vomiting)    Functional Assessment Tool Used: clinical judgment Functional Limitation: Changing and maintaining body position Mobility: Walking and Moving Around Current Status [id number]): At least 1 percent but less than 20 percent impaired, limited or restricted Mobility: Walking and Moving Around Goal Status [phone]): At least 1 percent but less than 20 percent impaired, limited or restricted Changing and Maintaining Body Position Current Status [id number]): 0 percent impaired, limited or restricted Changing and Maintaining Body Position Goal Status [id number]): 0 percent impaired, limited or restricted    Time: [id number] PT Time Calculation (min) (ACUTE ONLY): 11 min   Charges:   PT Evaluation $PT Eval Moderate Complexity: 1 Procedure     PT G Codes:   PT G-Codes **NOT FOR INPATIENT CLASS** Functional Assessment Tool  Used: clinical judgment Functional Limitation: Changing and maintaining body position Mobility: Walking and Moving Around Current Status [id number]): At least 1 percent but less than 20 percent impaired, limited or restricted Mobility: Walking and Moving Around Goal Status [phone]): At least 1 percent but less than 20 percent impaired, limited or restricted Changing and Maintaining Body Position Current Status [id number]): 0 percent impaired, limited or restricted Changing and Maintaining Body Position Goal Status [id number]): 0 percent impaired, limited or restricted    Denice Paradise 11/27/2015, 9:31 AM [facility] Acute Rehabilitation [phone] (pager)

## 2015-11-27 NOTE — Progress Notes (Addendum)
[place] [facility] for heparin + warfarin Indication: mechanical AVR  Allergies  Allergen Reactions  . Acetaminophen Other (See Comments)    Liver transplant recipient   . Codeine Itching  . Mirtazapine Other (See Comments)    hallucination  . Morphine Itching and Other (See Comments)    hallucinate    Patient Measurements: Height: 5' 4.5" (163.8 cm) Weight: 126 lb 11.2 oz (57.5 kg) (scale b) IBW/kg (Calculated) : 55.85 Heparin Dosing Weight: 56.7 kg  Vital Signs: Temp: 97.8 F (36.6 C) (10/17 0517) Temp Source: Oral (10/17 0517) BP: 148/52 (10/17 0517) Pulse Rate: 61 (10/17 0517)  Labs:  Recent Labs  11/26/15 1322 11/27/15 0234  HGB 11.5* 10.0*  HCT 34.9* 30.8*  PLT 238 223  LABPROT 21.0* 24.2*  INR 1.79 2.14  HEPARINUNFRC  --  0.28*  CREATININE 3.70* 4.88*    Estimated Creatinine Clearance: 10.7 mL/min (by C-G formula based on SCr of 4.88 mg/dL (H)). ESRD dialysis MWF  Medical History: Past Medical History:  Diagnosis Date  . Anemia    takes Folic Acid daily  . Anxiety   . Aortic stenosis   . Arthritis    "left hand, back" (08/30/2012)  . Asthma   . CAD (coronary artery disease)   . CAD (coronary artery disease) Jan. 2015   Cath: 20% LAD, 50% D1; s/p LIMA-LAD  . Chronic back pain   . Chronic bronchitis ([place])    "q yr w/season changes" (08/30/2012)  . Chronic constipation    takes MIralax and Colace daily  . COPD (chronic obstructive pulmonary disease) ([place])   . Depression    takes Cymbalta for "severe" depression  . End stage renal disease on dialysis [facility]) 02/27/2011   Kidneys shut down at time of liver transplant in Sept 2011 at [facility] in [place], she has been on HD ever since.  Dialyzes at [facility] HD on TTS schedule.  Had L forearm graft used 10 months then removed Dec 2012 due to suspected infection.  A right upper arm AVG was placed Dec 2012 but she developed steal symptoms acutely and it was ligated the same day.   Never had an AV fistula due to small veins.  Now has L thigh AVG put in Jan 2013, has not clotted to date.    [name] GERD (gastroesophageal reflux disease)    takes Omeprazole daily  . Headache    "at least monthly" (08/30/2012)  . Hepatitis C   . History of blood transfusion    "several" (08/30/2012)  . Hypertension    takes Metoprolol and Lisinopril daily, sees Dr Bea Graff  . Hypothyroidism    takes Synthroid daily  . Migraine    "last migraine was in 2013" (08/30/2012)  . Neuromuscular disorder ([place])    carpal tunnel in right hand  . Obesity   . Peripheral vascular disease (H[facility]) hands and legs  . Pneumonia    "today and several times before" (08/30/2012)  . 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 SVT (supraventricular tachycardia) ([place]) 06/09/14  . Tobacco abuse   . Type II diabetes mellitus (H[facility])    Levemir 2units daily if > 150    Medications:  Facility-Administered Medications Prior to Admission  Medication Dose Route Frequency Provider Last Rate Last Dose  . [DISCONTINUED] fentaNYL (SUBLIMAZE) injection  50 mcg  50 mcg Intravenous Once Serafina Mitchell, MD       Prescriptions Prior to Admission  Medication Sig Dispense Refill Last Dose  . aspirin EC 81 MG tablet Take 81 mg by mouth daily.   11/25/2015 at Unknown time  . calcium acetate (PHOSLO) 667 MG capsule Take 1 capsule (667 mg total) by mouth 3 (three) times daily with meals. 30 capsule 1 11/25/2015 at Unknown time  . gabapentin (NEURONTIN) 300 MG capsule Take 300 mg by mouth every evening.   11/25/2015 at Unknown time  . levothyroxine (SYNTHROID, LEVOTHROID) 150 MCG tablet Take 150 mcg by mouth daily before breakfast.   11/25/2015 at Unknown time  . metoprolol tartrate (LOPRESSOR) 25 MG tablet Take 12.5 mg by mouth 2 (two) times daily.    11/25/2015 at 8a  . ondansetron (ZOFRAN ODT) 4 MG  disintegrating tablet Take 1 tablet (4 mg total) by mouth every 8 (eight) hours as needed for nausea. 6 tablet 0 Past Month at Unknown time  . senna-docusate (SENOKOT-S) 8.6-50 MG tablet Take 1 tablet by mouth daily.   11/25/2015 at Unknown time  . tacrolimus (PROGRAF) 1 MG capsule Take 1-2 mg by mouth 2 (two) times daily. Take 2 capsules in the morning, and 1 capsule in the evening   11/25/2015 at Unknown time  . warfarin (COUMADIN) 2 MG tablet Take 1- 2 tablets by mouth daily or as directed by coumadin clinic 45 tablet 3 11/25/2015 at 11p  . warfarin (COUMADIN) 2 MG tablet Take 2-4 mg by mouth See admin instructions. 2 mg every day except on Friday patient takes 4 mg   11/25/2015 at Unknown time  . nitroGLYCERIN (NITROSTAT) 0.4 MG SL tablet Place 1 tablet (0.4 mg total) under the tongue every 5 (five) minutes as needed for chest pain. 30 tablet 0 unk    Assessment: 51 yof on PTA warfarin s/p mechanical AVR. Admit INR subtherapeutic at 1.69, now up to 2.14 this AM (last dose PTA was 10/15). Pharmacy consulted to dose heparin, now adding back warfarin on 10/17. CT head/spine negative for bleeding. Noted s/p liver transplant, LFTs elevated, trending down. Heparin level slightly subtherapeutic this AM (0.28). Will increase slightly to keep in range. Hg down 10, plt wnl. No issues with IV line or bleeding per RN.  PTA warfarin dose: 2mg  daily (last dose 10/15 PTA) - per last outpatient anticoag note, bleeds easily after HD, often needing ER trips  Goal of Therapy:  Heparin level 0.3-0.7 units/ml  INR 2.5-3 (per outpatient anticoag note) Monitor platelets by anticoagulation protocol: Yes   Plan:  Increase heparin slightly to 900 units/h to keep in range - d/c when INR therapeutic Warfarin 2mg  x 1 dose tonight 8h heparin level, daily CBC/heparin level Monitor LFT trend, s/sx bleeding  Elicia Lamp, PharmD, BCPS Clinical Pharmacist 11/27/2015 8:42 AM

## 2015-11-27 NOTE — Progress Notes (Signed)
ANTICOAGULATION CONSULT NOTE - Follow Up Consult  Pharmacy Consult for Heparin and Coumadin Indication: mechanical AVR  Patient Measurements: Height: 5' 4.5" (163.8 cm) Weight: 126 lb 11.2 oz (57.5 kg) (scale b) IBW/kg (Calculated) : 55.85 Heparin Dosing Weight: 57.5 kg  Labs:  Recent Labs  11/26/15 1322 11/27/15 0234 11/27/15 1829  HGB 11.5* 10.0*  --   HCT 34.9* 30.8*  --   PLT 238 223  --   LABPROT 21.0* 24.2*  --   INR 1.79 2.14  --   HEPARINUNFRC  --  0.28* 0.34  CREATININE 3.70* 4.88*  --    Assessment:   Heparin level is low therapeutic (0.34) tonight on 900 units/hr.    Goal of Therapy:  Heparin level 0.3-0.7 units/ml INR 2.5-3 (per outpatient anticoag note) Monitor platelets by anticoagulation protocol: Yes   Plan:   Continue heparin drip at 900 units/hr.  Coumadin 2 mg given tonight.  Next heparin level, CBC and PT/INR in am.  Arty Baumgartner, RPh Pager: [phone] 11/27/2015,7:50 PM

## 2015-11-27 NOTE — Progress Notes (Signed)
PROGRESS NOTE    [name]  [id number] DOB: [birth year] DOA: 11/26/2015 PCP: No PCP Per Patient   Brief Narrative:  60 yo female with esrd on HD, developed syncope for a few seconds, with prodrome dizziness. Event after HD. Patient's dry weight been adjusted on HD due to recent change in weight. Since admission blood pressure has been stable. Will have HD in before discharge.   Assessment & Plan:   Principal Problem:   Syncope Active Problems:   Hypothyroidism   DM2 (diabetes mellitus, type 2) ([place])   Essential hypertension   GERD   End stage renal disease on dialysis-TTS   CAD- s/p LIMA-LAD Feb. 2015   Aortic stenosis, moderate-severe at cath Jan 2015. s/p Mechanical AVR 03/2013   Dyslipidemia-LDL 104, not on statin with Hx of liver transplant   Long term current use of anticoagulant therapy   DM type 2 (diabetes mellitus, type 2) ([place])   H/O unilateral nephrectomy   Pressure injury of skin   1. Syncope. Suspected to be volume related. I spoke with nephrology nurse, over the phone, apparently patient's dry weight has been changing as an outpatient, will continue to follow orthostatic vital signs q shift, will continue to hold on IV fluids, will get patient out of bed, physical therapy evaluation. Patient has been subtherapeutic on INR, will get echocardiogram to assess prosthetic valve.   2. ESRD on HD. Will continue to hold on IV fluids, will follow renal panel in am, contacted nephrology who will follow the patient for in house HD. K at 4,9 with serum bicarb at 29 and Bun at 29.   3. Liver transplant. Improving LFTs. Will continue to follow liver panel in am, suspected injury related to hypotension, will continue immunosupressive therapy with tacrolimus.   4. Mechanic aortic valve. No angina or signs of heart failure, will continue anticoagulation bridge with heparin drip, will continue warfarin po, follow INR.   5. HTN. Blood pressure has remain stable since admission,  will continue close monitoring. Will continue metoprolol 12,5 mg po bid.   6, CAD. Continue aspirin,    DVT prophylaxis: warfarin  Code Status:  Full  Family Communication: I spoke with patient's daughter at the bedside and all questions were addressed.  Disposition Plan: home   Consultants:   Nephrology   Procedures:   Antimicrobials:  Subjective: Patient with no chest pain or dyspnea. No dizziness. No nausea or vomiting.   Objective: Vitals:   11/26/15 1855 11/26/15 2120 11/27/15 0025 11/27/15 0517  BP: (!) 146/48  (!) 144/47 (!) 148/52  Pulse: 64  73 61  Resp: 18  20 20   Temp: 98.5 F (36.9 C) 98.6 F (37 C) 98.3 F (36.8 C) 97.8 F (36.6 C)  TempSrc: Oral Oral Oral Oral  SpO2: 99% 100% 98% 99%  Weight: 57.5 kg (126 lb 11.2 oz)   57.5 kg (126 lb 11.2 oz)  Height: 5' 4.5" (1.638 m)       Intake/Output Summary (Last 24 hours) at 11/27/15 1028 Last data filed at 11/27/15 0600  Gross per 24 hour  Intake           327.33 ml  Output                0 ml  Net           327.33 ml   Filed Weights   11/26/15 1241 11/26/15 1855 11/27/15 0517  Weight: 56.7 kg (125 lb) 57.5 kg (126 lb 11.2 oz)  57.5 kg (126 lb 11.2 oz)    Examination:  General exam: deconditioned E ENT: mild conjunctival pallor, oral mucosa moist. Respiratory system: Clear to auscultation.no wheezing, rales or rhonchi.  Cardiovascular system: S1 & S2 heard, RRR. No JVD, rubs, gallops or clicks. No pedal edema. systolic murmur, parasternal,. 3/6.  Gastrointestinal system: Abdomen is nondistended, soft and nontender. No organomegaly or masses felt. Normal bowel sounds heard. Central nervous system: Alert and oriented. No focal neurological deficits. Extremities: Symmetric 5 x 5 power. Skin: No rashes, lesions or ulcers     Data Reviewed: I have personally reviewed following labs and imaging studies  CBC:  Recent Labs Lab 11/26/15 1322 11/27/15 0234  WBC 9.2 7.1  HGB 11.5* 10.0*  HCT 34.9*  30.8*  MCV 93.8 91.7  PLT 238 [id number]   Basic Metabolic Panel:  Recent Labs Lab 11/26/15 1322 11/27/15 0234  NA 140 139  K 4.4 4.9  CL 97* 98*  CO2 32 29  GLUCOSE 96 104*  BUN 7 14  CREATININE 3.70* 4.88*  CALCIUM 9.2 8.9   GFR: Estimated Creatinine Clearance: 10.7 mL/min (by C-G formula based on SCr of 4.88 mg/dL (H)). Liver Function Tests:  Recent Labs Lab 11/26/15 1322 11/27/15 0234  AST 355* 347*  ALT 188* 164*  ALKPHOS 432* 387*  BILITOT 0.7 0.6  PROT 8.6* 6.8  ALBUMIN 3.3* 2.7*   No results for input(s): LIPASE, AMYLASE in the last 168 hours.  Recent Labs Lab 11/26/15 1510  AMMONIA 53*   Coagulation Profile:  Recent Labs Lab 11/21/15 11/26/15 1322 11/27/15 0234  INR 1.6* 1.79 2.14   Cardiac Enzymes: No results for input(s): CKTOTAL, CKMB, CKMBINDEX, TROPONINI in the last 168 hours. BNP (last 3 results) No results for input(s): PROBNP in the last 8760 hours. HbA1C: No results for input(s): HGBA1C in the last 72 hours. CBG:  Recent Labs Lab 11/27/15 0559  GLUCAP 90   Lipid Profile: No results for input(s): CHOL, HDL, LDLCALC, TRIG, CHOLHDL, LDLDIRECT in the last 72 hours. Thyroid Function Tests: No results for input(s): TSH, T4TOTAL, FREET4, T3FREE, THYROIDAB in the last 72 hours. Anemia Panel: No results for input(s): VITAMINB12, FOLATE, FERRITIN, TIBC, IRON, RETICCTPCT in the last 72 hours. Sepsis Labs: No results for input(s): PROCALCITON, LATICACIDVEN in the last 168 hours.  Recent Results (from the past 240 hour(s))  MRSA PCR Screening     Status: None   Collection Time: 11/26/15  6:55 PM  Result Value Ref Range Status   MRSA by PCR NEGATIVE NEGATIVE Final    Comment:        The GeneXpert MRSA Assay (FDA approved for NASAL specimens only), is one component of a comprehensive MRSA colonization surveillance program. It is not intended to diagnose MRSA infection nor to guide or monitor treatment for MRSA infections.           Radiology Studies: Dg Chest 2 View  Result Date: 11/26/2015 CLINICAL DATA:  Patient with syncope status post dialysis treatment. EXAM: CHEST  2 VIEW COMPARISON:  Chest radiograph 09/19/2015. FINDINGS: Stable cardiac and mediastinal contours status post median sternotomy. Aortic vascular calcifications. No consolidative pulmonary opacities. No pleural effusion or pneumothorax. Thoracic spine degenerative changes. Upper abdominal surgical clips. IMPRESSION: No active cardiopulmonary disease. Electronically Signed   By: Lovey Newcomer M.D.   On: 11/26/2015 14:05   Ct Head Wo Contrast  Result Date: 11/26/2015 CLINICAL DATA:  Syncope with fall EXAM: CT HEAD WITHOUT CONTRAST CT CERVICAL SPINE WITHOUT CONTRAST TECHNIQUE: Multidetector CT  imaging of the head and cervical spine was performed following the standard protocol without intravenous contrast. Multiplanar CT image reconstructions of the cervical spine were also generated. COMPARISON:  CT head and CT cervical spine March 09, 2015 FINDINGS: CT HEAD FINDINGS Brain: Mild diffuse atrophy is stable. There is no intracranial mass hemorrhage, extra-axial fluid collection, or midline shift. There is slight small vessel disease in the centra semiovale bilaterally. Elsewhere gray-white compartments appear normal. No acute infarct evident. Calcification in the basal ganglia regions is felt to be physiologic. Vascular: There is no hyperdense vessel. There is calcification in carotid siphon regions bilaterally. Skull: The bony calvarium appears intact. Sinuses/Orbits: There is opacification in the left and right sphenoid sinuses. There is opacification in a posterior right ethmoid air cells. There is a bony defect in the right medial orbital wall, a stable finding that may either be congenital or residua of old trauma. Orbits otherwise appear symmetric bilaterally. Other:  Mastoid air cells are clear. CT CERVICAL SPINE FINDINGS Alignment: There is no  spondylolisthesis. Skull base and vertebrae: Skull base and craniocervical junction regions appear normal. There is mild pannus posterior to the odontoid, not causing appreciable impression on the craniocervical junction. There is no demonstrable fracture. No blastic or lytic bone lesions are evident. Soft tissues and spinal canal: Prevertebral soft tissues and predental space regions are normal. No paraspinous lesion. No spinal stenosis evident. Disc levels: No appreciable disc space narrowing noted. There is a central disc extrusion at C5-6 which appears stable compared to the prior study. There is modest impression on the thecal sac ventrally at this site. No other disc extrusion seen. There is slight facet hypertrophy at several levels without nerve root edema or effacement. Upper chest: Visualized lung apices are clear. Other: There is carotid artery calcification bilaterally. IMPRESSION: CT head: Stable mild atrophy with mild periventricular small vessel disease. No intracranial mass, hemorrhage, or extra-axial fluid collection. No acute infarct evident. Areas of paranasal sinus disease. Areas of arterial vascular calcification. CT cervical spine: No fracture or spondylolisthesis. Central disc extrusion at C5-6, stable. No frank spinal stenosis. There is carotid artery calcification bilaterally. Electronically Signed   By: Lowella Grip III M.D.   On: 11/26/2015 14:31   Ct Cervical Spine Wo Contrast  Result Date: 11/26/2015 CLINICAL DATA:  Syncope with fall EXAM: CT HEAD WITHOUT CONTRAST CT CERVICAL SPINE WITHOUT CONTRAST TECHNIQUE: Multidetector CT imaging of the head and cervical spine was performed following the standard protocol without intravenous contrast. Multiplanar CT image reconstructions of the cervical spine were also generated. COMPARISON:  CT head and CT cervical spine March 09, 2015 FINDINGS: CT HEAD FINDINGS Brain: Mild diffuse atrophy is stable. There is no intracranial mass  hemorrhage, extra-axial fluid collection, or midline shift. There is slight small vessel disease in the centra semiovale bilaterally. Elsewhere gray-white compartments appear normal. No acute infarct evident. Calcification in the basal ganglia regions is felt to be physiologic. Vascular: There is no hyperdense vessel. There is calcification in carotid siphon regions bilaterally. Skull: The bony calvarium appears intact. Sinuses/Orbits: There is opacification in the left and right sphenoid sinuses. There is opacification in a posterior right ethmoid air cells. There is a bony defect in the right medial orbital wall, a stable finding that may either be congenital or residua of old trauma. Orbits otherwise appear symmetric bilaterally. Other:  Mastoid air cells are clear. CT CERVICAL SPINE FINDINGS Alignment: There is no spondylolisthesis. Skull base and vertebrae: Skull base and craniocervical junction regions appear  normal. There is mild pannus posterior to the odontoid, not causing appreciable impression on the craniocervical junction. There is no demonstrable fracture. No blastic or lytic bone lesions are evident. Soft tissues and spinal canal: Prevertebral soft tissues and predental space regions are normal. No paraspinous lesion. No spinal stenosis evident. Disc levels: No appreciable disc space narrowing noted. There is a central disc extrusion at C5-6 which appears stable compared to the prior study. There is modest impression on the thecal sac ventrally at this site. No other disc extrusion seen. There is slight facet hypertrophy at several levels without nerve root edema or effacement. Upper chest: Visualized lung apices are clear. Other: There is carotid artery calcification bilaterally. IMPRESSION: CT head: Stable mild atrophy with mild periventricular small vessel disease. No intracranial mass, hemorrhage, or extra-axial fluid collection. No acute infarct evident. Areas of paranasal sinus disease. Areas of  arterial vascular calcification. CT cervical spine: No fracture or spondylolisthesis. Central disc extrusion at C5-6, stable. No frank spinal stenosis. There is carotid artery calcification bilaterally. Electronically Signed   By: Lowella Grip III M.D.   On: 11/26/2015 14:31        Scheduled Meds: . aspirin EC  81 mg Oral Daily  . calcium acetate  667 mg Oral TID WC  . gabapentin  300 mg Oral QPM  . levothyroxine  150 mcg Oral QAC breakfast  . metoprolol tartrate  12.5 mg Oral BID  . senna-docusate  1 tablet Oral Daily  . sodium chloride flush  3 mL Intravenous Q12H  . tacrolimus  2 mg Oral Daily   And  . tacrolimus  1 mg Oral QHS   Continuous Infusions: . heparin 900 Units/hr (11/27/15 0908)     LOS: 0 days        Keary Waterson Gerome Apley, MD [facility] Pager [phone]  If 7PM-7AM, please contact night-coverage [email or web address] Password [email or web address] 11/27/2015, 10:28 AM

## 2015-11-27 NOTE — Consult Note (Signed)
Renal Service Consult Note [facility]  [name] 11/27/2015 Roney Jaffe D Requesting Physician:  Dr Cathlean Sauer  Reason for Consult:  ESRD pt w syncope HPI: The patient is a 60 y.o. year-old with history of ESRD, s/p mech AVR, DM2, PVD, HTN, hep C, COPD, anemia who presented with syncopal episode at home last night.  Pt had had HD earlier in the day "without any problems".  Later that evening she stood up , felt wobbly and passed out.  No incontinence or tongue biting.  Admitted to hospital.  Today lying BP is good, but when measured standing drops into the 90's.  Spoke w HD staff RN at [facility] unit who notes that patient had two recent surgeries and lost a lot of weight, and that now she is gaining that weight back.    Patient denies CP, SOB, LE edema.  Says had recent surg for poor ciruclation to the left leg and some tissue damage on the foot.  Does well w HD, doesn't miss.      ROS  denies CP  no joint pain   no HA  no blurry vision  no rash  no diarrhea  no nausea/ vomiting  no dysuria  no difficulty voiding  no change in urine color    Past Medical History  Past Medical History:  Diagnosis Date  . Anemia    takes Folic Acid daily  . Anxiety   . Aortic stenosis   . Arthritis    "left hand, back" (08/30/2012)  . Asthma   . CAD (coronary artery disease)   . CAD (coronary artery disease) Jan. 2015   Cath: 20% LAD, 50% D1; s/p LIMA-LAD  . Chronic back pain   . Chronic bronchitis ([place])    "q yr w/season changes" (08/30/2012)  . Chronic constipation    takes MIralax and Colace daily  . COPD (chronic obstructive pulmonary disease) ([place])   . Depression    takes Cymbalta for "severe" depression  . End stage renal disease on dialysis [facility]) 02/27/2011   Kidneys shut down at time of liver transplant in Sept 2011 at [facility] in [place], she has been on HD ever since.  Dialyzes at [facility] HD on TTS schedule.  Had L forearm graft used 10 months then removed  Dec 2012 due to suspected infection.  A right upper arm AVG was placed Dec 2012 but she developed steal symptoms acutely and it was ligated the same day.  Never had an AV fistula due to small veins.  Now has L thigh AVG put in Jan 2013, has not clotted to date.    [name] GERD (gastroesophageal reflux disease)    takes Omeprazole daily  . Headache    "at least monthly" (08/30/2012)  . Hepatitis C   . History of blood transfusion    "several" (08/30/2012)  . Hypertension    takes Metoprolol and Lisinopril daily, sees Dr Bea Graff  . Hypothyroidism    takes Synthroid daily  . Migraine    "last migraine was in 2013" (08/30/2012)  . Neuromuscular disorder ([place])    carpal tunnel in right hand  . Obesity   . Peripheral vascular disease (HCC) hands and legs  . Pneumonia    "today and several times before" (08/30/2012)  . 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  SVT (supraventricular tachycardia) ([place]) 06/09/14  . Tobacco abuse   . Type II diabetes mellitus (HCC)    Levemir 2units daily if > 150   Past Surgical History  Past Surgical History:  Procedure Laterality Date  . AORTIC VALVE REPLACEMENT N/A 03/15/2013   AVR; Surgeon: Ivin Poot, MD;  Location: [facility] OR; Open Heart Surgery;  33mm[employer] mechanical prosthesis, top hat valve  . ARTERIOVENOUS GRAFT PLACEMENT Left 10/03/10    forearm  . AV FISTULA PLACEMENT  01/29/2011   Procedure: INSERTION OF ARTERIOVENOUS (AV) GORE-TEX GRAFT ARM;  Surgeon: Elam Dutch, MD;  [facility] OR;  Service: Vascular;  Laterality: Right;  . AV FISTULA PLACEMENT  03/10/2011   Procedure: INSERTION OF ARTERIOVENOUS (AV) GORE-TEX GRAFT THIGH;  Surgeon: Elam Dutch, MD;  Location: [place];  Service: Vascular;  Laterality: Left;  . [place] REMOVAL  12/23/2010   Procedure: REMOVAL OF ARTERIOVENOUS GORETEX GRAFT ([place]);  Surgeon:  Elam Dutch, MD;  Location: [place];  Service: Vascular;  Laterality: Left;  procedure started @1736 [id number]  . CHOLECYSTECTOMY  1993  . CORONARY ARTERY BYPASS GRAFT N/A 03/15/2013   Procedure: CORONARY ARTERY BYPASS GRAFTING (CABG) times one using left internal mammary artery.;  Surgeon: Ivin Poot, MD;  Location: [place];  Service: Open Heart Surgery;  Laterality: N/A;  POSS CABG X 1  . CYSTOSCOPY  1990's  . INSERTION OF DIALYSIS CATHETER  12/23/2010   Procedure: INSERTION OF DIALYSIS CATHETER;  Surgeon: Elam Dutch, MD;  Location: [place];  Service: Vascular;  Laterality: Right;  Right Internal Jugular 28cm dialysis catheter insertion procedure time 1701-1720   . INTRAOPERATIVE TRANSESOPHAGEAL ECHOCARDIOGRAM N/A 03/15/2013   Procedure: INTRAOPERATIVE TRANSESOPHAGEAL ECHOCARDIOGRAM;  Surgeon: Ivin Poot, MD;  Location: [place];  Service: Open Heart Surgery;  Laterality: N/A;  . LEFT HEART CATHETERIZATION WITH CORONARY ANGIOGRAM N/A 07/29/2012   Procedure: LEFT HEART CATHETERIZATION WITH CORONARY ANGIOGRAM;  Surgeon: Troy Sine, MD;  Location: [facility] CATH LAB;  Service: Cardiovascular;  Laterality: N/A;  . LEFT HEART CATHETERIZATION WITH CORONARY ANGIOGRAM N/A 03/10/2013   Procedure: LEFT HEART CATHETERIZATION WITH CORONARY ANGIOGRAM;  Surgeon: Troy Sine, MD;  Location: [facility] CATH LAB;  Service: Cardiovascular;  Laterality: N/A;  . LEFT HEART CATHETERIZATION WITH CORONARY/GRAFT ANGIOGRAM N/A 12/24/2011   Procedure: LEFT HEART CATHETERIZATION WITH Beatrix Fetters;  Surgeon: [name], MD;  Location: [facility] CATH LAB;  Service: Cardiovascular;  Laterality: N/A;  . LEFT HEART CATHETERIZATION WITH CORONARY/GRAFT ANGIOGRAM N/A 12/16/2013   Procedure: LEFT HEART CATHETERIZATION WITH Beatrix Fetters;  Surgeon: Troy Sine, MD;  Location: [facility] CATH LAB;  Service: Cardiovascular;  Laterality: N/A;  . LIVER TRANSPLANT  10/25/2009   sees Dr Ferol Luz 1 every 6 months, saw last in Dec  2013. [name] Coord [phone]  . PERIPHERAL VASCULAR CATHETERIZATION N/A 11/06/2015   Procedure: Abdominal Aortogram;  Surgeon: Serafina Mitchell, MD;  Location: [place] CV LAB;  Service: Cardiovascular;  Laterality: N/A;  . PERIPHERAL VASCULAR CATHETERIZATION N/A 11/06/2015   Procedure: Lower Extremity Angiography;  Surgeon: Serafina Mitchell, MD;  Location: [place] CV LAB;  Service: Cardiovascular;  Laterality: N/A;  . PERIPHERAL VASCULAR CATHETERIZATION  11/06/2015   Procedure: Peripheral Vascular Atherectomy;  Surgeon: Serafina Mitchell, MD;  Location: [place] CV LAB;  Service: Cardiovascular;;  Left Superficial femoral  . SHUNTOGRAM Left 05/15/2014   Procedure: SHUNTOGRAM;  Surgeon: Conrad [place], MD;  Location: [facility] CATH LAB;  Service: Cardiovascular;  Laterality: Left;  .  SMALL INTESTINE SURGERY  90's  . SPINAL GROWTH RODS  2010   "put 2 metal rods in my back; they had detetriorated" (08/30/2012)  . THROMBECTOMY    . THROMBECTOMY AND REVISION OF ARTERIOVENTOUS (AV) GORETEX  GRAFT Left 03/30/2014  . THROMBECTOMY AND REVISION OF ARTERIOVENTOUS (AV) GORETEX  GRAFT Left 03/30/2014   Procedure: THROMBECTOMY AND REVISION OF ARTERIOVENTOUS (AV) GORETEX  GRAFT;  Surgeon: Conrad Newtonia, MD;  Location: [place];  Service: Vascular;  Laterality: Left;  . TUBAL LIGATION  1990's   Family History  Family History  Problem Relation Age of Onset  . Cancer Mother   . Diabetes Mother   . Hypertension Mother   . Stroke Mother   . Cancer Father   . Anesthesia problems Neg Hx   . Hypotension Neg Hx   . Malignant hyperthermia Neg Hx   . Pseudochol deficiency Neg Hx    Social History  reports that she has been smoking Cigarettes.  She has a 40.00 pack-year smoking history. She has never used smokeless tobacco. She reports that she does not drink alcohol or use drugs. Allergies  Allergies  Allergen Reactions  . Acetaminophen Other (See Comments)    Liver transplant recipient   . Codeine Itching   . Mirtazapine Other (See Comments)    hallucination  . Morphine Itching and Other (See Comments)    hallucinate   Home medications Prior to Admission medications   Medication Sig Start Date End Date Taking? Authorizing Provider  aspirin EC 81 MG tablet Take 81 mg by mouth daily.   Yes Historical Provider, MD  calcium acetate (PHOSLO) 667 MG capsule Take 1 capsule (667 mg total) by mouth 3 (three) times daily with meals. 10/16/15  Yes Orson Eva, MD  gabapentin (NEURONTIN) 300 MG capsule Take 300 mg by mouth every evening.   Yes Historical Provider, MD  levothyroxine (SYNTHROID, LEVOTHROID) 150 MCG tablet Take 150 mcg by mouth daily before breakfast.   Yes Historical Provider, MD  metoprolol tartrate (LOPRESSOR) 25 MG tablet Take 12.5 mg by mouth 2 (two) times daily.  03/16/14  Yes Historical Provider, MD  ondansetron (ZOFRAN ODT) 4 MG disintegrating tablet Take 1 tablet (4 mg total) by mouth every 8 (eight) hours as needed for nausea. 08/13/15  Yes Tanna Furry, MD  senna-docusate (SENOKOT-S) 8.6-50 MG tablet Take 1 tablet by mouth daily.   Yes Historical Provider, MD  tacrolimus (PROGRAF) 1 MG capsule Take 1-2 mg by mouth 2 (two) times daily. Take 2 capsules in the morning, and 1 capsule in the evening   Yes Historical Provider, MD  warfarin (COUMADIN) 2 MG tablet Take 1- 2 tablets by mouth daily or as directed by coumadin clinic 11/01/15  Yes Troy Sine, MD  warfarin (COUMADIN) 2 MG tablet Take 2-4 mg by mouth See admin instructions. 2 mg every day except on Friday patient takes 4 mg   Yes Historical Provider, MD  nitroGLYCERIN (NITROSTAT) 0.4 MG SL tablet Place 1 tablet (0.4 mg total) under the tongue every 5 (five) minutes as needed for chest pain. 03/20/14   Albertine Patricia, MD   Liver Function Tests  Recent Labs Lab 11/26/15 1322 11/27/15 0234  AST 355* 347*  ALT 188* 164*  ALKPHOS 432* 387*  BILITOT 0.7 0.6  PROT 8.6* 6.8  ALBUMIN 3.3* 2.7*   No results for input(s): LIPASE,  AMYLASE in the last 168 hours. CBC  Recent Labs Lab 11/26/15 1322 11/27/15 0234  WBC 9.2 7.1  HGB 11.5* 10.0*  HCT 34.9* 30.8*  MCV 93.8 91.7  PLT 238 [id number]   Basic Metabolic Panel  Recent Labs Lab 11/26/15 1322 11/27/15 0234  NA 140 139  K 4.4 4.9  CL 97* 98*  CO2 32 29  GLUCOSE 96 104*  BUN 7 14  CREATININE 3.70* 4.88*  CALCIUM 9.2 8.9   Iron/TIBC/Ferritin/ %Sat    Component Value Date/Time   FERRITIN 1,095 (H) 08/30/2012 1449    Vitals:   11/26/15 2120 11/27/15 0025 11/27/15 0517 11/27/15 1130  BP:  (!) 144/47 (!) 148/52 (!) 156/58  Pulse:  73 61 68  Resp:  20 20 18   Temp: 98.6 F (37 C) 98.3 F (36.8 C) 97.8 F (36.6 C) 97.8 F (36.6 C)  TempSrc: Oral Oral Oral Oral  SpO2: 100% 98% 99% 97%  Weight:   57.5 kg (126 lb 11.2 oz)   Height:       Exam [place]Ying BP  120/80,  HR 68  [place] 1 min  93/56, HR 73 Standing 2 min  103/ 55  HR 72 Gen alert, no distress, calm No rash, cyanosis or gangrene Sclera anicteric, throat clear , moist No jvd or bruits  Chest clear bilat RRR no MRG , large sternal scar Abd soft ntnd no mass or ascites +bs  GU deferred MS no joint effusions or deformity Ext no LE or UE edema / no wounds or ulcers Neuro is alert, Ox 3 , nf L thigh AVG +bruit    Dialysis: 3h 12min  56.5kg   Left at 57.3kg last HD  Hep none  L thigh AVG Mircera 75 ug q 4, last 9/27  Assessment: 1.  Syncope - is orthostatic on exam.  Diff dx AI, sepsis, GIB, none of which clinically apparent.  Prob is gaining lean body weight after wt loss surrounding recent surgeries.   2.  ESRD MWF hd 3.  PAD - left foot ischemia issues 4.  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  Liver transplant 6.  HTN BP's good, cont metoprolol 7.  Mech AVR - on coumadin, and IV hep for low INR     Plan - NS bolus and raise dry wt by 2- 2.5 kg.  HD in am , no fluid off. No other suggestions, will follow.   [name] MD [name] pager [phone]   11/27/2015,  2:33 PM

## 2015-11-28 DIAGNOSIS — E038 Other specified hypothyroidism: Secondary | ICD-10-CM

## 2015-11-28 DIAGNOSIS — N186 End stage renal disease: Secondary | ICD-10-CM

## 2015-11-28 DIAGNOSIS — R945 Abnormal results of liver function studies: Secondary | ICD-10-CM

## 2015-11-28 DIAGNOSIS — Z7901 Long term (current) use of anticoagulants: Secondary | ICD-10-CM

## 2015-11-28 DIAGNOSIS — I10 Essential (primary) hypertension: Secondary | ICD-10-CM

## 2015-11-28 DIAGNOSIS — I350 Nonrheumatic aortic (valve) stenosis: Secondary | ICD-10-CM

## 2015-11-28 DIAGNOSIS — R7989 Other specified abnormal findings of blood chemistry: Secondary | ICD-10-CM

## 2015-11-28 DIAGNOSIS — I25708 Atherosclerosis of coronary artery bypass graft(s), unspecified, with other forms of angina pectoris: Secondary | ICD-10-CM

## 2015-11-28 DIAGNOSIS — Z992 Dependence on renal dialysis: Secondary | ICD-10-CM

## 2015-11-28 DIAGNOSIS — R55 Syncope and collapse: Secondary | ICD-10-CM

## 2015-11-28 DIAGNOSIS — E1121 Type 2 diabetes mellitus with diabetic nephropathy: Secondary | ICD-10-CM

## 2015-11-28 LAB — PROTIME-INR
INR: 2.12
Prothrombin Time: 24.1 seconds — ABNORMAL HIGH (ref 11.4–15.2)

## 2015-11-28 LAB — CBC
HEMATOCRIT: 30.1 % — AB (ref 36.0–46.0)
HEMOGLOBIN: 9.8 g/dL — AB (ref 12.0–15.0)
MCH: 29.5 pg (ref 26.0–34.0)
MCHC: 32.6 g/dL (ref 30.0–36.0)
MCV: 90.7 fL (ref 78.0–100.0)
Platelets: 244 10*3/uL (ref 150–400)
RBC: 3.32 MIL/uL — AB (ref 3.87–5.11)
RDW: 18.4 % — ABNORMAL HIGH (ref 11.5–15.5)
WBC: 6.4 10*3/uL (ref 4.0–10.5)

## 2015-11-28 LAB — BASIC METABOLIC PANEL
ANION GAP: 11 (ref 5–15)
BUN: 28 mg/dL — ABNORMAL HIGH (ref 6–20)
CALCIUM: 8.9 mg/dL (ref 8.9–10.3)
CHLORIDE: 97 mmol/L — AB (ref 101–111)
CO2: 29 mmol/L (ref 22–32)
Creatinine, Ser: 6.81 mg/dL — ABNORMAL HIGH (ref 0.44–1.00)
GFR calc non Af Amer: 6 mL/min — ABNORMAL LOW (ref >60)
GFR, EST AFRICAN AMERICAN: 7 mL/min — AB (ref >60)
GLUCOSE: 95 mg/dL (ref 65–99)
POTASSIUM: 5 mmol/L (ref 3.5–5.1)
Sodium: 137 mmol/L (ref 135–145)

## 2015-11-28 LAB — GLUCOSE, CAPILLARY
GLUCOSE-CAPILLARY: 86 mg/dL (ref 65–99)
GLUCOSE-CAPILLARY: 94 mg/dL (ref 65–99)

## 2015-11-28 LAB — HEPATIC FUNCTION PANEL
ALBUMIN: 2.6 g/dL — AB (ref 3.5–5.0)
ALT: 177 U/L — AB (ref 14–54)
AST: 362 U/L — AB (ref 15–41)
Alkaline Phosphatase: 388 U/L — ABNORMAL HIGH (ref 38–126)
BILIRUBIN DIRECT: 0.2 mg/dL (ref 0.1–0.5)
BILIRUBIN TOTAL: 0.6 mg/dL (ref 0.3–1.2)
Indirect Bilirubin: 0.4 mg/dL (ref 0.3–0.9)
Total Protein: 6.6 g/dL (ref 6.5–8.1)

## 2015-11-28 LAB — HEPARIN LEVEL (UNFRACTIONATED): Heparin Unfractionated: 0.4 IU/mL (ref 0.30–0.70)

## 2015-11-28 MED ORDER — PENTAFLUOROPROP-TETRAFLUOROETH EX AERO: 1.0000 "application " | INHALATION_SPRAY | CUTANEOUS | Status: DC | PRN

## 2015-11-28 MED ORDER — LIDOCAINE-PRILOCAINE 2.5-2.5 % EX CREA: 1.0000 "application " | TOPICAL_CREAM | CUTANEOUS | Status: DC | PRN

## 2015-11-28 MED ORDER — LIDOCAINE HCL (PF) 1 % IJ SOLN: 5.0000 mL | INTRAMUSCULAR | Status: DC | PRN

## 2015-11-28 MED ORDER — HEPARIN SODIUM (PORCINE) 1000 UNIT/ML DIALYSIS: 1000.0000 [IU] | INTRAMUSCULAR | Status: DC | PRN

## 2015-11-28 MED ORDER — SODIUM CHLORIDE 0.9 % IV SOLN: 100.0000 mL | INTRAVENOUS | Status: DC | PRN

## 2015-11-28 MED ORDER — WARFARIN SODIUM 2 MG PO TABS
4.0000 mg | ORAL_TABLET | Freq: Once | ORAL | Status: AC
  Administered 2015-11-28: 4 mg via ORAL
  Filled 2015-11-28: qty 2

## 2015-11-28 MED ORDER — ALTEPLASE 2 MG IJ SOLR: 2.0000 mg | Freq: Once | INTRAMUSCULAR | Status: DC | PRN

## 2015-11-28 NOTE — Consult Note (Signed)
   [facility] CM Inpatient Consult   11/28/2015  [name] November 01, 1955 [id number]  Referral received to assess for care management services.   Patient had recently declined services.   Met with the patient regarding the benefits of [facility] Care [facility] and to restart services.  Explained that [place] [facility] is a covered benefit of insurance. Review information for [facility] Care Management and a folder was provided with contact information.  Explained that [place] [facility] does not interfere with or replace any services arranged by the inpatient care management staff.  Patient declined services with [facility] stating, "I feel like you all did a good job and I was doing good.  Daughter states, "She just came back in because of her liver test were up and she passed out after dialysis.  She's good at home and we don't need anything right now." Patient did accept contact information and let her know she can always call back and use the 24 hour nurse advise line.  For questions, please contact:  Natividad Brood, RN BSN [facility] Liaison  [phone] business mobile phone Toll free office [phone]

## 2015-11-28 NOTE — Progress Notes (Signed)
Pt returned on the unit from HD. VSS.

## 2015-11-28 NOTE — Progress Notes (Signed)
PROGRESS NOTE    [name]  [id number] DOB: 31-Mar-1955 DOA: 11/26/2015 PCP: No PCP Per Patient   Chief Complaint  Patient presents with  . Loss of Consciousness    Brief Narrative:  60 yo female with esrd on HD, developed syncope for a few seconds, with prodrome dizziness. Event after HD. Patient's dry weight been adjusted on HD due to recent change in weight. Since admission blood pressure has been stable. Will have HD in before discharge.   Assessment & Plan   Syncope -Possibly secondary to volume. It seems the patient's dry weight has been changing as an outpatient with dialysis. -Physical therapy: Recommended home health PT and 24 hour supervision/assistance.  ESRD -Patient status post dialysis today. -Nephrology consulted and appreciated  Liver transplant -Patient does have elevated LFTs. Possibly related to hypotension -Continue tacrolimus  Mechanical Aortic Valve -Per outpatient documentation, all INR is between 2.5 and 3 -Currently INR 2.12 -Continue Coumadin with heparin  Essential Hypertension -Continue metoprolol  CAD -Stable, no complains of chest pain. -Continue aspirin  Hypothyroidism -Continue Synthroid  DVT Prophylaxis  Coumadin/heparin  Code Status: Full  Family Communication: Daughter at bedside  Disposition Plan: Admitted. Pending "therapeutic INR"- d/c home with home health  Consultants Nephrology  Procedures  None  Antibiotics   Anti-infectives    None      Subjective:   [name] seen and examined today.  Patient denies any chest pain, shortness of breath, abdominal pain, nausea or vomiting, diarrhea or constipation. No longer complains of dizziness.  Objective:   Vitals:   11/28/15 1000 11/28/15 1026 11/28/15 1032 11/28/15 1125  BP: (!) 108/48 (!) 123/54 (!) 146/50 (!) 139/59  Pulse: 73 71 71 66  Resp: 13 17 (!) 26 18  Temp:   97.1 F (36.2 C)   TempSrc:   Oral   SpO2:   97% 100%  Weight:   59.1 kg (130  lb 4.7 oz)   Height:        Intake/Output Summary (Last 24 hours) at 11/28/15 1405 Last data filed at 11/28/15 1032  Gross per 24 hour  Intake           530.87 ml  Output                0 ml  Net           530.87 ml   Filed Weights   11/27/15 0517 11/28/15 0650 11/28/15 1032  Weight: 57.5 kg (126 lb 11.2 oz) 58.6 kg (129 lb 3 oz) 59.1 kg (130 lb 4.7 oz)    Exam  General: Well developed, well nourished, NAD, appears stated age  HEENT: NCAT,mucous membranes moist.   Cardiovascular: S1 S2 auscultated, no rubs, murmurs or gallops. Regular rate and rhythm.  Respiratory: Clear to auscultation bilaterally with equal chest rise  Abdomen: Soft, nontender, nondistended, + bowel sounds  Extremities: warm dry without cyanosis clubbing or edema.   Neuro: AAOx3, nonfocal  Psych: Normal affect and demeanor with intact judgement and insight   Data Reviewed: I have personally reviewed following labs and imaging studies  CBC:  Recent Labs Lab 11/26/15 1322 11/27/15 0234 11/28/15 0506  WBC 9.2 7.1 6.4  HGB 11.5* 10.0* 9.8*  HCT 34.9* 30.8* 30.1*  MCV 93.8 91.7 90.7  PLT 238 223 [id number]   Basic Metabolic Panel:  Recent Labs Lab 11/26/15 1322 11/27/15 0234 11/28/15 0506  NA 140 139 137  K 4.4 4.9 5.0  CL 97* 98* 97*  CO2  32 29 29  GLUCOSE 96 104* 95  BUN 7 14 28*  CREATININE 3.70* 4.88* 6.81*  CALCIUM 9.2 8.9 8.9   GFR: Estimated Creatinine Clearance: 7.7 mL/min (by C-G formula based on SCr of 6.81 mg/dL (H)). Liver Function Tests:  Recent Labs Lab 11/26/15 1322 11/27/15 0234 11/28/15 0506  AST 355* 347* 362*  ALT 188* 164* 177*  ALKPHOS 432* 387* 388*  BILITOT 0.7 0.6 0.6  PROT 8.6* 6.8 6.6  ALBUMIN 3.3* 2.7* 2.6*   No results for input(s): LIPASE, AMYLASE in the last 168 hours.  Recent Labs Lab 11/26/15 1510  AMMONIA 53*   Coagulation Profile:  Recent Labs Lab 11/26/15 1322 11/27/15 0234 11/28/15 0506  INR 1.79 2.14 2.12   Cardiac  Enzymes: No results for input(s): CKTOTAL, CKMB, CKMBINDEX, TROPONINI in the last 168 hours. BNP (last 3 results) No results for input(s): PROBNP in the last 8760 hours. HbA1C: No results for input(s): HGBA1C in the last 72 hours. CBG:  Recent Labs Lab 11/27/15 0559 11/28/15 0554 11/28/15 1133  GLUCAP 90 86 94   Lipid Profile: No results for input(s): CHOL, HDL, LDLCALC, TRIG, CHOLHDL, LDLDIRECT in the last 72 hours. Thyroid Function Tests: No results for input(s): TSH, T4TOTAL, FREET4, T3FREE, THYROIDAB in the last 72 hours. Anemia Panel: No results for input(s): VITAMINB12, FOLATE, FERRITIN, TIBC, IRON, RETICCTPCT in the last 72 hours. Urine analysis:    Component Value Date/Time   COLORURINE YELLOW 11/02/2012 0428   APPEARANCEUR CLOUDY (A) 11/02/2012 0428   LABSPEC 1.011 11/02/2012 0428   PHURINE 8.0 11/02/2012 0428   GLUCOSEU 250 (A) 11/02/2012 0428   HGBUR LARGE (A) 11/02/2012 0428   BILIRUBINUR NEGATIVE 11/02/2012 0428   KETONESUR NEGATIVE 11/02/2012 0428   PROTEINUR >300 (A) 11/02/2012 0428   UROBILINOGEN 0.2 11/02/2012 0428   NITRITE NEGATIVE 11/02/2012 0428   LEUKOCYTESUR NEGATIVE 11/02/2012 0428   Sepsis Labs: @LABRCNTIP (procalcitonin:4,lacticidven:4)  ) Recent Results (from the past 240 hour(s))  MRSA PCR Screening     Status: None   Collection Time: 11/26/15  6:55 PM  Result Value Ref Range Status   MRSA by PCR NEGATIVE NEGATIVE Final    Comment:        The GeneXpert MRSA Assay (FDA approved for NASAL specimens only), is one component of a comprehensive MRSA colonization surveillance program. It is not intended to diagnose MRSA infection nor to guide or monitor treatment for MRSA infections.       Radiology Studies: Ct Head Wo Contrast  Result Date: 11/26/2015 CLINICAL DATA:  Syncope with fall EXAM: CT HEAD WITHOUT CONTRAST CT CERVICAL SPINE WITHOUT CONTRAST TECHNIQUE: Multidetector CT imaging of the head and cervical spine was performed  following the standard protocol without intravenous contrast. Multiplanar CT image reconstructions of the cervical spine were also generated. COMPARISON:  CT head and CT cervical spine March 09, 2015 FINDINGS: CT HEAD FINDINGS Brain: Mild diffuse atrophy is stable. There is no intracranial mass hemorrhage, extra-axial fluid collection, or midline shift. There is slight small vessel disease in the centra semiovale bilaterally. Elsewhere gray-white compartments appear normal. No acute infarct evident. Calcification in the basal ganglia regions is felt to be physiologic. Vascular: There is no hyperdense vessel. There is calcification in carotid siphon regions bilaterally. Skull: The bony calvarium appears intact. Sinuses/Orbits: There is opacification in the left and right sphenoid sinuses. There is opacification in a posterior right ethmoid air cells. There is a bony defect in the right medial orbital wall, a stable finding that may either  be congenital or residua of old trauma. Orbits otherwise appear symmetric bilaterally. Other:  Mastoid air cells are clear. CT CERVICAL SPINE FINDINGS Alignment: There is no spondylolisthesis. Skull base and vertebrae: Skull base and craniocervical junction regions appear normal. There is mild pannus posterior to the odontoid, not causing appreciable impression on the craniocervical junction. There is no demonstrable fracture. No blastic or lytic bone lesions are evident. Soft tissues and spinal canal: Prevertebral soft tissues and predental space regions are normal. No paraspinous lesion. No spinal stenosis evident. Disc levels: No appreciable disc space narrowing noted. There is a central disc extrusion at C5-6 which appears stable compared to the prior study. There is modest impression on the thecal sac ventrally at this site. No other disc extrusion seen. There is slight facet hypertrophy at several levels without nerve root edema or effacement. Upper chest: Visualized lung  apices are clear. Other: There is carotid artery calcification bilaterally. IMPRESSION: CT head: Stable mild atrophy with mild periventricular small vessel disease. No intracranial mass, hemorrhage, or extra-axial fluid collection. No acute infarct evident. Areas of paranasal sinus disease. Areas of arterial vascular calcification. CT cervical spine: No fracture or spondylolisthesis. Central disc extrusion at C5-6, stable. No frank spinal stenosis. There is carotid artery calcification bilaterally. Electronically Signed   By: Lowella Grip III M.D.   On: 11/26/2015 14:31   Ct Cervical Spine Wo Contrast  Result Date: 11/26/2015 CLINICAL DATA:  Syncope with fall EXAM: CT HEAD WITHOUT CONTRAST CT CERVICAL SPINE WITHOUT CONTRAST TECHNIQUE: Multidetector CT imaging of the head and cervical spine was performed following the standard protocol without intravenous contrast. Multiplanar CT image reconstructions of the cervical spine were also generated. COMPARISON:  CT head and CT cervical spine March 09, 2015 FINDINGS: CT HEAD FINDINGS Brain: Mild diffuse atrophy is stable. There is no intracranial mass hemorrhage, extra-axial fluid collection, or midline shift. There is slight small vessel disease in the centra semiovale bilaterally. Elsewhere gray-white compartments appear normal. No acute infarct evident. Calcification in the basal ganglia regions is felt to be physiologic. Vascular: There is no hyperdense vessel. There is calcification in carotid siphon regions bilaterally. Skull: The bony calvarium appears intact. Sinuses/Orbits: There is opacification in the left and right sphenoid sinuses. There is opacification in a posterior right ethmoid air cells. There is a bony defect in the right medial orbital wall, a stable finding that may either be congenital or residua of old trauma. Orbits otherwise appear symmetric bilaterally. Other:  Mastoid air cells are clear. CT CERVICAL SPINE FINDINGS Alignment: There is  no spondylolisthesis. Skull base and vertebrae: Skull base and craniocervical junction regions appear normal. There is mild pannus posterior to the odontoid, not causing appreciable impression on the craniocervical junction. There is no demonstrable fracture. No blastic or lytic bone lesions are evident. Soft tissues and spinal canal: Prevertebral soft tissues and predental space regions are normal. No paraspinous lesion. No spinal stenosis evident. Disc levels: No appreciable disc space narrowing noted. There is a central disc extrusion at C5-6 which appears stable compared to the prior study. There is modest impression on the thecal sac ventrally at this site. No other disc extrusion seen. There is slight facet hypertrophy at several levels without nerve root edema or effacement. Upper chest: Visualized lung apices are clear. Other: There is carotid artery calcification bilaterally. IMPRESSION: CT head: Stable mild atrophy with mild periventricular small vessel disease. No intracranial mass, hemorrhage, or extra-axial fluid collection. No acute infarct evident. Areas of paranasal sinus disease.  Areas of arterial vascular calcification. CT cervical spine: No fracture or spondylolisthesis. Central disc extrusion at C5-6, stable. No frank spinal stenosis. There is carotid artery calcification bilaterally. Electronically Signed   By: Lowella Grip III M.D.   On: 11/26/2015 14:31     Scheduled Meds: . aspirin EC  81 mg Oral Daily  . calcium acetate  667 mg Oral TID WC  . gabapentin  300 mg Oral QPM  . levothyroxine  150 mcg Oral QAC breakfast  . metoprolol tartrate  12.5 mg Oral BID  . senna-docusate  1 tablet Oral Daily  . sodium chloride flush  3 mL Intravenous Q12H  . tacrolimus  2 mg Oral Daily   And  . tacrolimus  1 mg Oral QHS  . warfarin  4 mg Oral ONCE-1800  . Warfarin - Pharmacist Dosing Inpatient   Does not apply q1800   Continuous Infusions: . heparin 900 Units/hr (11/27/15 2043)      LOS: 1 day   Time Spent in minutes   30 minutes  Xaden Kaufman D.O. on 11/28/2015 at 2:05 PM  Between 7am to 7pm - Pager - [phone]  After 7pm go to [email or web address] - password [email or web address]  And look for the night coverage person covering for me after hours  [facility] Office  [phone]

## 2015-11-28 NOTE — Progress Notes (Signed)
  [facility] Progress Note   Subjective: no c/o  Vitals:   11/28/15 0830 11/28/15 0900 11/28/15 0930 11/28/15 1000  BP: (!) 99/48 (!) 92/56 (!) 91/50 (!) 108/48  Pulse: 71 70 74 73  Resp: 19 17 15 13   Temp:      TempSrc:      SpO2:      Weight:      Height:        Inpatient medications: . aspirin EC  81 mg Oral Daily  . calcium acetate  667 mg Oral TID WC  . gabapentin  300 mg Oral QPM  . levothyroxine  150 mcg Oral QAC breakfast  . metoprolol tartrate  12.5 mg Oral BID  . senna-docusate  1 tablet Oral Daily  . sodium chloride flush  3 mL Intravenous Q12H  . tacrolimus  2 mg Oral Daily   And  . tacrolimus  1 mg Oral QHS  . Warfarin - Pharmacist Dosing Inpatient   Does not apply q1800   . heparin 900 Units/hr (11/27/15 2043)   sodium chloride, sodium chloride, alteplase, heparin, HYDROmorphone (DILAUDID) injection, lidocaine (PF), lidocaine-prilocaine, nitroGLYCERIN, ondansetron **OR** ondansetron (ZOFRAN) IV, pentafluoroprop-tetrafluoroeth, traMADol  Exam: Gen alert, no distress, calm No rash, cyanosis or gangrene Sclera anicteric, throat clear , moist No jvd or bruits  Chest clear bilat RRR no MRG , large sternal scar Abd soft ntnd no mass or ascites +bs  Ext no LE or UE edema / no wounds or ulcers Neuro is alert, Ox 3 , nf L thigh AVG +bruit  Dialysis: 3h 41min  56.5kg   Left at 57.3kg last HD  Hep none  L thigh AVG Mircera 75 ug q 4, last 9/27  Assessment: 1.  Syncope - most likely gaining body wt (had two surgeries in last 3-4 mos and lost wt) 2.  ESRD MWF hd 3.  PAD - left foot ischemia issues 4.  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  Liver transplant 6.  HTN BP's good, cont metoprolol 7.  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  Dispo - per primary   Plan - HD today, no fluid off, raising dry wt to 58.5- 59kg   [name] MD [facility] pager [phone]   11/28/2015, 10:25 AM    Recent Labs Lab  11/26/15 1322 11/27/15 0234 11/28/15 0506  NA 140 139 137  K 4.4 4.9 5.0  CL 97* 98* 97*  CO2 32 29 29  GLUCOSE 96 104* 95  BUN 7 14 28*  CREATININE 3.70* 4.88* 6.81*  CALCIUM 9.2 8.9 8.9    Recent Labs Lab 11/26/15 1322 11/27/15 0234 11/28/15 0506  AST 355* 347* 362*  ALT 188* 164* 177*  ALKPHOS 432* 387* 388*  BILITOT 0.7 0.6 0.6  PROT 8.6* 6.8 6.6  ALBUMIN 3.3* 2.7* 2.6*    Recent Labs Lab 11/26/15 1322 11/27/15 0234 11/28/15 0506  WBC 9.2 7.1 6.4  HGB 11.5* 10.0* 9.8*  HCT 34.9* 30.8* 30.1*  MCV 93.8 91.7 90.7  PLT 238 223 244   Iron/TIBC/Ferritin/ %Sat    Component Value Date/Time   FERRITIN 1,095 (H) 08/30/2012 1449

## 2015-11-28 NOTE — Progress Notes (Signed)
[place] for heparin + warfarin Indication: mechanical AVR  Allergies  Allergen Reactions  . Acetaminophen Other (See Comments)    Liver transplant recipient   . Codeine Itching  . Mirtazapine Other (See Comments)    hallucination  . Morphine Itching and Other (See Comments)    hallucinate    Patient Measurements: Height: 5' 4.5" (163.8 cm) Weight: 130 lb 4.7 oz (59.1 kg) (standing) IBW/kg (Calculated) : 55.85 Heparin Dosing Weight: 56.7 kg  Vital Signs: Temp: 97.1 F (36.2 C) (10/18 1032) Temp Source: Oral (10/18 1032) BP: 139/59 (10/18 1125) Pulse Rate: 66 (10/18 1125)  Labs:  Recent Labs  11/26/15 1322 11/27/15 0234 11/27/15 1829 11/28/15 0506  HGB 11.5* 10.0*  --  9.8*  HCT 34.9* 30.8*  --  30.1*  PLT 238 223  --  244  LABPROT 21.0* 24.2*  --  24.1*  INR 1.79 2.14  --  2.12  HEPARINUNFRC  --  0.28* 0.34 0.40  CREATININE 3.70* 4.88*  --  6.81*    Estimated Creatinine Clearance: 7.7 mL/min (by C-G formula based on SCr of 6.81 mg/dL (H)). ESRD dialysis MWF  Medical History: Past Medical History:  Diagnosis Date  . Anemia    takes Folic Acid daily  . Anxiety   . Aortic stenosis   . Arthritis    "left hand, back" (08/30/2012)  . Asthma   . CAD (coronary artery disease)   . CAD (coronary artery disease) Jan. 2015   Cath: 20% LAD, 50% D1; s/p LIMA-LAD  . Chronic back pain   . Chronic bronchitis ([place])    "q yr w/season changes" (08/30/2012)  . Chronic constipation    takes MIralax and Colace daily  . COPD (chronic obstructive pulmonary disease) ([place])   . Depression    takes Cymbalta for "severe" depression  . End stage renal disease on dialysis [facility]) 02/27/2011   Kidneys shut down at time of liver transplant in Sept 2011 at [facility] in [place], she has been on HD ever since.  Dialyzes at [facility] HD on TTS schedule.  Had L forearm graft used 10 months then removed Dec 2012 due to suspected infection.  A right upper  arm AVG was placed Dec 2012 but she developed steal symptoms acutely and it was ligated the same day.  Never had an AV fistula due to small veins.  Now has L thigh AVG put in Jan 2013, has not clotted to date.    [name] GERD (gastroesophageal reflux disease)    takes Omeprazole daily  . Headache    "at least monthly" (08/30/2012)  . Hepatitis C   . History of blood transfusion    "several" (08/30/2012)  . Hypertension    takes Metoprolol and Lisinopril daily, sees Dr Bea Graff  . Hypothyroidism    takes Synthroid daily  . Migraine    "last migraine was in 2013" (08/30/2012)  . Neuromuscular disorder ([place])    carpal tunnel in right hand  . Obesity   . Peripheral vascular disease ([facility]) hands and legs  . Pneumonia    "today and several times before" (08/30/2012)  . 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 SVT (supraventricular tachycardia) ([place]) 06/09/14  . Tobacco abuse   . Type II diabetes mellitus (H[facility])    Levemir 2units daily  if > 150    Medications:  Facility-Administered Medications Prior to Admission  Medication Dose Route Frequency Provider Last Rate Last Dose  . [DISCONTINUED] fentaNYL (SUBLIMAZE) injection 50 mcg  50 mcg Intravenous Once Serafina Mitchell, MD       Prescriptions Prior to Admission  Medication Sig Dispense Refill Last Dose  . aspirin EC 81 MG tablet Take 81 mg by mouth daily.   11/25/2015 at Unknown time  . calcium acetate (PHOSLO) 667 MG capsule Take 1 capsule (667 mg total) by mouth 3 (three) times daily with meals. 30 capsule 1 11/25/2015 at Unknown time  . gabapentin (NEURONTIN) 300 MG capsule Take 300 mg by mouth every evening.   11/25/2015 at Unknown time  . levothyroxine (SYNTHROID, LEVOTHROID) 150 MCG tablet Take 150 mcg by mouth daily before breakfast.   11/25/2015 at Unknown time  . metoprolol tartrate (LOPRESSOR) 25 MG tablet  Take 12.5 mg by mouth 2 (two) times daily.    11/25/2015 at 8a  . ondansetron (ZOFRAN ODT) 4 MG disintegrating tablet Take 1 tablet (4 mg total) by mouth every 8 (eight) hours as needed for nausea. 6 tablet 0 Past Month at Unknown time  . senna-docusate (SENOKOT-S) 8.6-50 MG tablet Take 1 tablet by mouth daily.   11/25/2015 at Unknown time  . tacrolimus (PROGRAF) 1 MG capsule Take 1-2 mg by mouth 2 (two) times daily. Take 2 capsules in the morning, and 1 capsule in the evening   11/25/2015 at Unknown time  . warfarin (COUMADIN) 2 MG tablet Take 1- 2 tablets by mouth daily or as directed by coumadin clinic 45 tablet 3 11/25/2015 at 11p  . warfarin (COUMADIN) 2 MG tablet Take 2-4 mg by mouth See admin instructions. 2 mg every day except on Friday patient takes 4 mg   11/25/2015 at Unknown time  . nitroGLYCERIN (NITROSTAT) 0.4 MG SL tablet Place 1 tablet (0.4 mg total) under the tongue every 5 (five) minutes as needed for chest pain. 30 tablet 0 unk    Assessment: 84 yof on PTA warfarin s/p mechanical AVR. Admit INR subtherapeutic at 1.69. Continuing on heparin+warfarin. CT head/spine negative for bleeding. Noted s/p liver transplant, LFTs elevated, stable.   INR 2.12 this AM (goal 2.5-3) - likely due to missed dose 10/16. Heparin level remains therapeutic (0.4). CBC stable, no bleed documented.  PTA warfarin dose: 2mg  daily (last dose 10/15 PTA) - per last outpatient anticoag note, bleeds easily after HD, often needing ER trips  Goal of Therapy:  Heparin level 0.3-0.7 units/ml  INR 2.5-3 (per outpatient anticoag note) Monitor platelets by anticoagulation protocol: Yes   Plan:  Heparin at 900 units/h - d/c when INR>2.5 Warfarin 4mg  x 1 dose tonight Daily CBC/heparin level Monitor LFT trend, s/sx bleeding  Elicia Lamp, PharmD, BCPS Clinical Pharmacist 11/28/2015 11:47 AM

## 2015-11-28 NOTE — Discharge Instructions (Signed)

## 2015-11-29 ENCOUNTER — Inpatient Hospital Stay (HOSPITAL_COMMUNITY): Payer: Medicare Other

## 2015-11-29 DIAGNOSIS — E785 Hyperlipidemia, unspecified: Secondary | ICD-10-CM

## 2015-11-29 DIAGNOSIS — R55 Syncope and collapse: Secondary | ICD-10-CM

## 2015-11-29 DIAGNOSIS — K219 Gastro-esophageal reflux disease without esophagitis: Secondary | ICD-10-CM

## 2015-11-29 LAB — COMPREHENSIVE METABOLIC PANEL
ALK PHOS: 420 U/L — AB (ref 38–126)
ALT: 240 U/L — ABNORMAL HIGH (ref 14–54)
ANION GAP: 9 (ref 5–15)
AST: 528 U/L — ABNORMAL HIGH (ref 15–41)
Albumin: 2.6 g/dL — ABNORMAL LOW (ref 3.5–5.0)
BILIRUBIN TOTAL: 0.9 mg/dL (ref 0.3–1.2)
BUN: 12 mg/dL (ref 6–20)
CALCIUM: 9.2 mg/dL (ref 8.9–10.3)
CO2: 31 mmol/L (ref 22–32)
Chloride: 96 mmol/L — ABNORMAL LOW (ref 101–111)
Creatinine, Ser: 4.36 mg/dL — ABNORMAL HIGH (ref 0.44–1.00)
GFR, EST AFRICAN AMERICAN: 12 mL/min — AB (ref >60)
GFR, EST NON AFRICAN AMERICAN: 10 mL/min — AB (ref >60)
GLUCOSE: 99 mg/dL (ref 65–99)
Potassium: 4.4 mmol/L (ref 3.5–5.1)
Sodium: 136 mmol/L (ref 135–145)
TOTAL PROTEIN: 6.5 g/dL (ref 6.5–8.1)

## 2015-11-29 LAB — ECHOCARDIOGRAM COMPLETE
Height: 64.5 in
Weight: 2095.25 oz

## 2015-11-29 LAB — CBC
HEMATOCRIT: 28.9 % — AB (ref 36.0–46.0)
HEMOGLOBIN: 9.6 g/dL — AB (ref 12.0–15.0)
MCH: 30.3 pg (ref 26.0–34.0)
MCHC: 33.2 g/dL (ref 30.0–36.0)
MCV: 91.2 fL (ref 78.0–100.0)
Platelets: 236 10*3/uL (ref 150–400)
RBC: 3.17 MIL/uL — ABNORMAL LOW (ref 3.87–5.11)
RDW: 18 % — AB (ref 11.5–15.5)
WBC: 6.8 10*3/uL (ref 4.0–10.5)

## 2015-11-29 LAB — GLUCOSE, CAPILLARY: GLUCOSE-CAPILLARY: 101 mg/dL — AB (ref 65–99)

## 2015-11-29 LAB — HEPARIN LEVEL (UNFRACTIONATED): Heparin Unfractionated: 0.4 IU/mL (ref 0.30–0.70)

## 2015-11-29 LAB — PROTIME-INR
INR: 2.29
Prothrombin Time: 25.6 seconds — ABNORMAL HIGH (ref 11.4–15.2)

## 2015-11-29 MED ORDER — TACROLIMUS 1 MG PO CAPS: 2.0000 mg | ORAL_CAPSULE | Freq: Two times a day (BID) | ORAL | 0 refills | Status: DC

## 2015-11-29 MED ORDER — WARFARIN SODIUM 2 MG PO TABS: 4.0000 mg | ORAL_TABLET | Freq: Once | ORAL | Status: DC

## 2015-11-29 NOTE — Progress Notes (Signed)
Physical Therapy Discharge Patient Details Name: MAURIA ASQUITH MRN: [id number] DOB: [birth year] Today's Date: 11/29/2015 Time: 4196-2229 PT Time Calculation (min) (ACUTE ONLY): 12 min  Patient discharged from PT services secondary to goals met and no further PT needs identified.  Please see latest therapy progress note for current level of functioning and progress toward goals.    Progress and discharge plan discussed with patient and/or caregiver: Patient/Caregiver agrees with plan  GP     Lelon Mast 11/29/2015, 10:29 AM

## 2015-11-29 NOTE — Progress Notes (Signed)
   [facility] Progress Note   Subjective: no c/o  Vitals:   11/28/15 2102 11/29/15 0535 11/29/15 0952 11/29/15 1222  BP: (!) 146/47 (!) 142/44 (!) 150/75 (!) 144/51  Pulse: 65 63 61 63  Resp: 17 16  18   Temp: 98.7 F (37.1 C) 98.1 F (36.7 C)  98.6 F (37 C)  TempSrc: Oral Oral  Oral  SpO2: 100% 100% 100% 99%  Weight:  59.4 kg (130 lb 15.3 oz)    Height:        Inpatient medications: . aspirin EC  81 mg Oral Daily  . calcium acetate  667 mg Oral TID WC  . gabapentin  300 mg Oral QPM  . levothyroxine  150 mcg Oral QAC breakfast  . metoprolol tartrate  12.5 mg Oral BID  . senna-docusate  1 tablet Oral Daily  . sodium chloride flush  3 mL Intravenous Q12H  . tacrolimus  2 mg Oral Daily   And  . tacrolimus  1 mg Oral QHS  . warfarin  4 mg Oral ONCE-1800  . Warfarin - Pharmacist Dosing Inpatient   Does not apply q1800     HYDROmorphone (DILAUDID) injection, nitroGLYCERIN, ondansetron **OR** ondansetron (ZOFRAN) IV, traMADol  Exam: Gen alert, no distress, calm No rash, cyanosis or gangrene Sclera anicteric, throat clear , moist No jvd or bruits  Chest clear bilat RRR no MRG , large sternal scar Abd soft ntnd no mass or ascites +bs  Ext no LE or UE edema / no wounds or ulcers Neuro is alert, Ox 3 , nf L thigh AVG +bruit  Dialysis: 3h 62min  56.5kg   Left at 57.3kg last HD  Hep none  L thigh AVG Mircera 75 ug q 4, last 9/27  Assessment: 1.  Syncope - most likely gaining body wt (had two surgeries in last 3-4 mos and lost wt) 2.  ESRD MWF hd 3.  PAD - left foot ischemia issues 4.  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  Liver transplant 6.  HTN BP's good, cont metoprolol 7.  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  Dispo - per primary   Plan - raising dry wt to 58.5- 59kg. DC per primary   [name] MD [facility] pager [phone]   11/29/2015, 12:58 PM    Recent Labs Lab 11/27/15 0234 11/28/15 0506 11/29/15 0455   NA 139 137 136  K 4.9 5.0 4.4  CL 98* 97* 96*  CO2 29 29 31   GLUCOSE 104* 95 99  BUN 14 28* 12  CREATININE 4.88* 6.81* 4.36*  CALCIUM 8.9 8.9 9.2    Recent Labs Lab 11/27/15 0234 11/28/15 0506 11/29/15 0455  AST 347* 362* 528*  ALT 164* 177* 240*  ALKPHOS 387* 388* 420*  BILITOT 0.6 0.6 0.9  PROT 6.8 6.6 6.5  ALBUMIN 2.7* 2.6* 2.6*    Recent Labs Lab 11/27/15 0234 11/28/15 0506 11/29/15 0455  WBC 7.1 6.4 6.8  HGB 10.0* 9.8* 9.6*  HCT 30.8* 30.1* 28.9*  MCV 91.7 90.7 91.2  PLT 223 244 236   Iron/TIBC/Ferritin/ %Sat    Component Value Date/Time   FERRITIN 1,095 (H) 08/30/2012 1449

## 2015-11-29 NOTE — Discharge Summary (Addendum)
Physician Discharge Summary  [name] [id number] DOB: 10/22/1955 DOA: 11/26/2015  PCP: No PCP Per Patient  Admit date: 11/26/2015 Discharge date: 11/29/2015  Time spent: 45 minutes  Recommendations for Outpatient Follow-up:  Patient will be discharged to home.  Patient will need to follow up with primary care provider within one week of discharge.  Have your INR checked in 2 days.  Continue dialysis as scheduled. Follow up with [place], coordinator will contact you. Patient should continue medications as prescribed.  Patient should follow a renal diet. Do not drive.  Discharge Diagnoses:  Syncope ESRD Liver transplant Mechanical Aortic Valve Essential Hypertension CAD Hypothyroidism  Discharge Condition: Stable  Diet recommendation: renal diet  Filed Weights   11/28/15 0650 11/28/15 1032 11/29/15 0535  Weight: 58.6 kg (129 lb 3 oz) 59.1 kg (130 lb 4.7 oz) 59.4 kg (130 lb 15.3 oz)    History of present illness:  On 11/26/2015 by Dr. Berle Mull [name] is a 60 y.o. female with Past medical history of ESRD on HD, liver transplant, nephrectomy, splenectomy, CAD, mechanical aortic valve, chronic Coumadin. Patient presented with complaints of syncopal episode. Patient was at her baseline in the morning, went for hemodialysis. Her hemodialysis was also uneventful. After the hemodialysis patient Home and was fixing her lunch and felt dizzy and lightheaded. She had to sit down in the chair and called her daughter. She may have passed out for a few seconds and was awakened by her daughter. EMS was called and they found that patient's blood pressure was in 60. Patient did not have any chest pain but did have some shortness of breath. At the time of my evaluation patient continues to have dizziness, denies any focal deficit, room spinning sensation, chest pain, abdominal pain, shortness of breath, nausea or vomiting, denies any diarrhea or constipation. Denies any  alcohol abuse, denies any use of Tylenol. Denies any recent change in medications.  Hospital Course:  Syncope -Possibly secondary to hypovolemia. It seems the patient's dry weight has been changing as an outpatient with dialysis. -Physical therapy: Recommended home health PT and 24 hour supervision/assistance.  ESRD -Nephrology consulted and appreciated -Continue HD as scheduled  Liver transplant -Patient does have elevated LFTs. Possibly related to hypotension -Continue tacrolimus -Spoke with [facility] hepatologist, Dr. Drue Novel, who recommended increasing Tacrolimus as patient's recent trough was low in September 2017.  Will increase to 2mg  BID.  -Patient may also needed outpatient liver biopsy at [facility].  This is to be scheduled through patient's coordinator.  (suspect patient will need to hold coumadin at least one week prior to this biopsy)  Mechanical Aortic Valve -Per outpatient documentation, all INR is between 2.5 and 3 -Currently INR 2.26 -Continue Coumadin  -Will need to have INR rechecked in 2 days.  -Echocardiogram: EF 55-60%, Aortic valve: A mechanical prosthesis was present. There was mild regurgitation.  Essential Hypertension -Continue metoprolol  CAD -Stable, no complains of chest pain. -Continue aspirin  Hypothyroidism -Continue Synthroid  Consultants Nephrology Hepatology at [facility], Dr. Drue Novel  Procedures  None  Discharge Exam: Vitals:   11/29/15 0952 11/29/15 1222  BP: (!) 150/75 (!) 144/51  Pulse: 61 63  Resp:  18  Temp:  98.6 F (37 C)   Patient no longer complains of dizziness or passing out.  Denies chest pain, shortness of breath, abdominal pain.  States she is ready to go home today.  Exam  General: Well developed, well nourished, NAD  HEENT: NCAT,mucous membranes moist.  Cardiovascular: S1 S2 auscultated, no murmurs noted, RRR  Respiratory: Clear to auscultation bilaterally with equal chest rise  Abdomen: Soft, nontender,  nondistended, + bowel sounds  Extremities: warm dry without cyanosis clubbing or edema.   Neuro: AAOx3, nonfocal  Psych: Normal affect and demeanor, pleasant  Discharge Instructions Discharge Instructions    Discharge instructions    Complete by:  As directed    Patient will be discharged to home.  Patient will need to follow up with primary care provider within one week of discharge.  Have your INR checked in 2 days.  Continue dialysis as scheduled. Follow up with [place], coordinator will contact you. Patient should continue medications as prescribed.  Patient should follow a renal diet. Do not drive.     Current Discharge Medication List    CONTINUE these medications which have CHANGED   Details  tacrolimus (PROGRAF) 1 MG capsule Take 2 capsules (2 mg total) by mouth 2 (two) times daily. Take 2 capsules twice daily. Qty: 120 capsule, Refills: 0      CONTINUE these medications which have NOT CHANGED   Details  aspirin EC 81 MG tablet Take 81 mg by mouth daily.    calcium acetate (PHOSLO) 667 MG capsule Take 1 capsule (667 mg total) by mouth 3 (three) times daily with meals. Qty: 30 capsule, Refills: 1    gabapentin (NEURONTIN) 300 MG capsule Take 300 mg by mouth every evening.    levothyroxine (SYNTHROID, LEVOTHROID) 150 MCG tablet Take 150 mcg by mouth daily before breakfast.    metoprolol tartrate (LOPRESSOR) 25 MG tablet Take 12.5 mg by mouth 2 (two) times daily.     ondansetron (ZOFRAN ODT) 4 MG disintegrating tablet Take 1 tablet (4 mg total) by mouth every 8 (eight) hours as needed for nausea. Qty: 6 tablet, Refills: 0    senna-docusate (SENOKOT-S) 8.6-50 MG tablet Take 1 tablet by mouth daily.    warfarin (COUMADIN) 2 MG tablet Take 1- 2 tablets by mouth daily or as directed by coumadin clinic Qty: 45 tablet, Refills: 3    nitroGLYCERIN (NITROSTAT) 0.4 MG SL tablet Place 1 tablet (0.4 mg total) under the tongue every 5 (five) minutes as needed for chest  pain. Qty: 30 tablet, Refills: 0       Allergies  Allergen Reactions  . Acetaminophen Other (See Comments)    Liver transplant recipient   . Codeine Itching  . Mirtazapine Other (See Comments)    hallucination  . Morphine Itching and Other (See Comments)    hallucinate      The results of significant diagnostics from this hospitalization (including imaging, microbiology, ancillary and laboratory) are listed below for reference.    Significant Diagnostic Studies: Dg Chest 2 View  Result Date: 11/26/2015 CLINICAL DATA:  Patient with syncope status post dialysis treatment. EXAM: CHEST  2 VIEW COMPARISON:  Chest radiograph 09/19/2015. FINDINGS: Stable cardiac and mediastinal contours status post median sternotomy. Aortic vascular calcifications. No consolidative pulmonary opacities. No pleural effusion or pneumothorax. Thoracic spine degenerative changes. Upper abdominal surgical clips. IMPRESSION: No active cardiopulmonary disease. Electronically Signed   By: Lovey Newcomer M.D.   On: 11/26/2015 14:05   Ct Head Wo Contrast  Result Date: 11/26/2015 CLINICAL DATA:  Syncope with fall EXAM: CT HEAD WITHOUT CONTRAST CT CERVICAL SPINE WITHOUT CONTRAST TECHNIQUE: Multidetector CT imaging of the head and cervical spine was performed following the standard protocol without intravenous contrast. Multiplanar CT image reconstructions of the cervical spine were also generated. COMPARISON:  CT head and CT cervical spine March 09, 2015 FINDINGS: CT HEAD FINDINGS Brain: Mild diffuse atrophy is stable. There is no intracranial mass hemorrhage, extra-axial fluid collection, or midline shift. There is slight small vessel disease in the centra semiovale bilaterally. Elsewhere gray-white compartments appear normal. No acute infarct evident. Calcification in the basal ganglia regions is felt to be physiologic. Vascular: There is no hyperdense vessel. There is calcification in carotid siphon regions bilaterally.  Skull: The bony calvarium appears intact. Sinuses/Orbits: There is opacification in the left and right sphenoid sinuses. There is opacification in a posterior right ethmoid air cells. There is a bony defect in the right medial orbital wall, a stable finding that may either be congenital or residua of old trauma. Orbits otherwise appear symmetric bilaterally. Other:  Mastoid air cells are clear. CT CERVICAL SPINE FINDINGS Alignment: There is no spondylolisthesis. Skull base and vertebrae: Skull base and craniocervical junction regions appear normal. There is mild pannus posterior to the odontoid, not causing appreciable impression on the craniocervical junction. There is no demonstrable fracture. No blastic or lytic bone lesions are evident. Soft tissues and spinal canal: Prevertebral soft tissues and predental space regions are normal. No paraspinous lesion. No spinal stenosis evident. Disc levels: No appreciable disc space narrowing noted. There is a central disc extrusion at C5-6 which appears stable compared to the prior study. There is modest impression on the thecal sac ventrally at this site. No other disc extrusion seen. There is slight facet hypertrophy at several levels without nerve root edema or effacement. Upper chest: Visualized lung apices are clear. Other: There is carotid artery calcification bilaterally. IMPRESSION: CT head: Stable mild atrophy with mild periventricular small vessel disease. No intracranial mass, hemorrhage, or extra-axial fluid collection. No acute infarct evident. Areas of paranasal sinus disease. Areas of arterial vascular calcification. CT cervical spine: No fracture or spondylolisthesis. Central disc extrusion at C5-6, stable. No frank spinal stenosis. There is carotid artery calcification bilaterally. Electronically Signed   By: Lowella Grip III M.D.   On: 11/26/2015 14:31   Ct Cervical Spine Wo Contrast  Result Date: 11/26/2015 CLINICAL DATA:  Syncope with fall  EXAM: CT HEAD WITHOUT CONTRAST CT CERVICAL SPINE WITHOUT CONTRAST TECHNIQUE: Multidetector CT imaging of the head and cervical spine was performed following the standard protocol without intravenous contrast. Multiplanar CT image reconstructions of the cervical spine were also generated. COMPARISON:  CT head and CT cervical spine March 09, 2015 FINDINGS: CT HEAD FINDINGS Brain: Mild diffuse atrophy is stable. There is no intracranial mass hemorrhage, extra-axial fluid collection, or midline shift. There is slight small vessel disease in the centra semiovale bilaterally. Elsewhere gray-white compartments appear normal. No acute infarct evident. Calcification in the basal ganglia regions is felt to be physiologic. Vascular: There is no hyperdense vessel. There is calcification in carotid siphon regions bilaterally. Skull: The bony calvarium appears intact. Sinuses/Orbits: There is opacification in the left and right sphenoid sinuses. There is opacification in a posterior right ethmoid air cells. There is a bony defect in the right medial orbital wall, a stable finding that may either be congenital or residua of old trauma. Orbits otherwise appear symmetric bilaterally. Other:  Mastoid air cells are clear. CT CERVICAL SPINE FINDINGS Alignment: There is no spondylolisthesis. Skull base and vertebrae: Skull base and craniocervical junction regions appear normal. There is mild pannus posterior to the odontoid, not causing appreciable impression on the craniocervical junction. There is no demonstrable fracture. No blastic or lytic bone lesions are  evident. Soft tissues and spinal canal: Prevertebral soft tissues and predental space regions are normal. No paraspinous lesion. No spinal stenosis evident. Disc levels: No appreciable disc space narrowing noted. There is a central disc extrusion at C5-6 which appears stable compared to the prior study. There is modest impression on the thecal sac ventrally at this site. No  other disc extrusion seen. There is slight facet hypertrophy at several levels without nerve root edema or effacement. Upper chest: Visualized lung apices are clear. Other: There is carotid artery calcification bilaterally. IMPRESSION: CT head: Stable mild atrophy with mild periventricular small vessel disease. No intracranial mass, hemorrhage, or extra-axial fluid collection. No acute infarct evident. Areas of paranasal sinus disease. Areas of arterial vascular calcification. CT cervical spine: No fracture or spondylolisthesis. Central disc extrusion at C5-6, stable. No frank spinal stenosis. There is carotid artery calcification bilaterally. Electronically Signed   By: Lowella Grip III M.D.   On: 11/26/2015 14:31    Microbiology: Recent Results (from the past 240 hour(s))  MRSA PCR Screening     Status: None   Collection Time: 11/26/15  6:55 PM  Result Value Ref Range Status   MRSA by PCR NEGATIVE NEGATIVE Final    Comment:        The GeneXpert MRSA Assay (FDA approved for NASAL specimens only), is one component of a comprehensive MRSA colonization surveillance program. It is not intended to diagnose MRSA infection nor to guide or monitor treatment for MRSA infections.      Labs: Basic Metabolic Panel:  Recent Labs Lab 11/26/15 1322 11/27/15 0234 11/28/15 0506 11/29/15 0455  NA 140 139 137 136  K 4.4 4.9 5.0 4.4  CL 97* 98* 97* 96*  CO2 32 29 29 31   GLUCOSE 96 104* 95 99  BUN 7 14 28* 12  CREATININE 3.70* 4.88* 6.81* 4.36*  CALCIUM 9.2 8.9 8.9 9.2   Liver Function Tests:  Recent Labs Lab 11/26/15 1322 11/27/15 0234 11/28/15 0506 11/29/15 0455  AST 355* 347* 362* 528*  ALT 188* 164* 177* 240*  ALKPHOS 432* 387* 388* 420*  BILITOT 0.7 0.6 0.6 0.9  PROT 8.6* 6.8 6.6 6.5  ALBUMIN 3.3* 2.7* 2.6* 2.6*   No results for input(s): LIPASE, AMYLASE in the last 168 hours.  Recent Labs Lab 11/26/15 1510  AMMONIA 53*   CBC:  Recent Labs Lab 11/26/15 1322  11/27/15 0234 11/28/15 0506 11/29/15 0455  WBC 9.2 7.1 6.4 6.8  HGB 11.5* 10.0* 9.8* 9.6*  HCT 34.9* 30.8* 30.1* 28.9*  MCV 93.8 91.7 90.7 91.2  PLT 238 223 244 236   Cardiac Enzymes: No results for input(s): CKTOTAL, CKMB, CKMBINDEX, TROPONINI in the last 168 hours. BNP: BNP (last 3 results)  Recent Labs  04/21/15 0753 07/09/15 1733  BNP 2,040.6* 1,495.0*    ProBNP (last 3 results) No results for input(s): PROBNP in the last 8760 hours.  CBG:  Recent Labs Lab 11/27/15 0559 11/28/15 0554 11/28/15 1133 11/29/15 0618  GLUCAP 90 86 94 101*       Signed:  Soliana Kitko  [facility] 11/29/2015, 1:04 PM

## 2015-11-29 NOTE — Progress Notes (Addendum)
[place] for heparin + warfarin Indication: mechanical AVR  Allergies  Allergen Reactions  . Acetaminophen Other (See Comments)    Liver transplant recipient   . Codeine Itching  . Mirtazapine Other (See Comments)    hallucination  . Morphine Itching and Other (See Comments)    hallucinate    Patient Measurements: Height: 5' 4.5" (163.8 cm) Weight: 130 lb 15.3 oz (59.4 kg) IBW/kg (Calculated) : 55.85 Heparin Dosing Weight: 56.7 kg  Vital Signs: Temp: 98.1 F (36.7 C) (10/19 0535) Temp Source: Oral (10/19 0535) BP: 142/44 (10/19 0535) Pulse Rate: 63 (10/19 0535)  Labs:  Recent Labs  11/27/15 0234 11/27/15 1829 11/28/15 0506 11/29/15 0455  HGB 10.0*  --  9.8* 9.6*  HCT 30.8*  --  30.1* 28.9*  PLT 223  --  244 236  LABPROT 24.2*  --  24.1* 25.6*  INR 2.14  --  2.12 2.29  HEPARINUNFRC 0.28* 0.34 0.40 0.40  CREATININE 4.88*  --  6.81* 4.36*    Estimated Creatinine Clearance: 12 mL/min (by C-G formula based on SCr of 4.36 mg/dL (H)). ESRD dialysis MWF  Medical History: Past Medical History:  Diagnosis Date  . Anemia    takes Folic Acid daily  . Anxiety   . Aortic stenosis   . Arthritis    "left hand, back" (08/30/2012)  . Asthma   . CAD (coronary artery disease)   . CAD (coronary artery disease) Jan. 2015   Cath: 20% LAD, 50% D1; s/p LIMA-LAD  . Chronic back pain   . Chronic bronchitis ([place])    "q yr w/season changes" (08/30/2012)  . Chronic constipation    takes MIralax and Colace daily  . COPD (chronic obstructive pulmonary disease) ([place])   . Depression    takes Cymbalta for "severe" depression  . End stage renal disease on dialysis [facility]) 02/27/2011   Kidneys shut down at time of liver transplant in Sept 2011 at [facility] in [place], she has been on HD ever since.  Dialyzes at [facility] HD on TTS schedule.  Had L forearm graft used 10 months then removed Dec 2012 due to suspected infection.  A right upper arm AVG was  placed Dec 2012 but she developed steal symptoms acutely and it was ligated the same day.  Never had an AV fistula due to small veins.  Now has L thigh AVG put in Jan 2013, has not clotted to date.    [name] GERD (gastroesophageal reflux disease)    takes Omeprazole daily  . Headache    "at least monthly" (08/30/2012)  . Hepatitis C   . History of blood transfusion    "several" (08/30/2012)  . Hypertension    takes Metoprolol and Lisinopril daily, sees Dr Bea Graff  . Hypothyroidism    takes Synthroid daily  . Migraine    "last migraine was in 2013" (08/30/2012)  . Neuromuscular disorder ([place])    carpal tunnel in right hand  . Obesity   . Peripheral vascular disease ([facility]) hands and legs  . Pneumonia    "today and several times before" (08/30/2012)  . 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 SVT (supraventricular tachycardia) ([place]) 06/09/14  . Tobacco abuse   . Type II diabetes mellitus (H[facility])    Levemir 2units daily if > 150  Medications:  Facility-Administered Medications Prior to Admission  Medication Dose Route Frequency Provider Last Rate Last Dose  . [DISCONTINUED] fentaNYL (SUBLIMAZE) injection 50 mcg  50 mcg Intravenous Once Serafina Mitchell, MD       Prescriptions Prior to Admission  Medication Sig Dispense Refill Last Dose  . aspirin EC 81 MG tablet Take 81 mg by mouth daily.   11/25/2015 at Unknown time  . calcium acetate (PHOSLO) 667 MG capsule Take 1 capsule (667 mg total) by mouth 3 (three) times daily with meals. 30 capsule 1 11/25/2015 at Unknown time  . gabapentin (NEURONTIN) 300 MG capsule Take 300 mg by mouth every evening.   11/25/2015 at Unknown time  . levothyroxine (SYNTHROID, LEVOTHROID) 150 MCG tablet Take 150 mcg by mouth daily before breakfast.   11/25/2015 at Unknown time  . metoprolol tartrate (LOPRESSOR) 25 MG tablet Take 12.5  mg by mouth 2 (two) times daily.    11/25/2015 at 8a  . ondansetron (ZOFRAN ODT) 4 MG disintegrating tablet Take 1 tablet (4 mg total) by mouth every 8 (eight) hours as needed for nausea. 6 tablet 0 Past Month at Unknown time  . senna-docusate (SENOKOT-S) 8.6-50 MG tablet Take 1 tablet by mouth daily.   11/25/2015 at Unknown time  . tacrolimus (PROGRAF) 1 MG capsule Take 1-2 mg by mouth 2 (two) times daily. Take 2 capsules in the morning, and 1 capsule in the evening   11/25/2015 at Unknown time  . warfarin (COUMADIN) 2 MG tablet Take 1- 2 tablets by mouth daily or as directed by coumadin clinic 45 tablet 3 11/25/2015 at 11p  . warfarin (COUMADIN) 2 MG tablet Take 2-4 mg by mouth See admin instructions. 2 mg every day except on Friday patient takes 4 mg   11/25/2015 at Unknown time  . nitroGLYCERIN (NITROSTAT) 0.4 MG SL tablet Place 1 tablet (0.4 mg total) under the tongue every 5 (five) minutes as needed for chest pain. 30 tablet 0 unk    Assessment: 88 yof on PTA warfarin s/p mechanical AVR. Admit INR subtherapeutic at 1.69. Continuing on heparin+warfarin. CT head/spine negative for bleeding. Noted s/p liver transplant, LFTs elevated, stable.   INR up to 2.29 this AM (goal 2.5-3) after increased dose last night - likely due to missed dose 10/16. Hesitant to be too aggressive due to documentation in last outpatient anticoag note that patient "bleeds easily after HD, often needing ER trips." Heparin level remains therapeutic (0.4) - Per discussion with MD, ok to d/c heparin today with INR>2. CBC stable, no bleed documented.  PTA warfarin dose: 2mg  daily (last dose 10/15 PTA)  Goal of Therapy:  INR 2.5-3 (per outpatient anticoag note) Monitor platelets by anticoagulation protocol: Yes   Plan:  Ok to d/c heparin today per discussion with MD, INR>2 Warfarin 4mg  x 1 dose tonight Monitor daily INR Monitor CBC, LFT trend, s/sx bleeding  Elicia Lamp, PharmD, BCPS Clinical Pharmacist 11/29/2015  8:46 AM

## 2015-11-29 NOTE — Progress Notes (Signed)
Physical Therapy Treatment Patient Details Name: [name] MRN: [id number] DOB: 08/24/1955 Today's Date: 11/29/2015    History of Present Illness (P) [name] is a 60 y.o. female with Past medical history of ESRD on HD, liver transplant, nephrectomy, splenectomy, CAD, mechanical aortic valve, chronic Coumadin.  Patient presented with complaints of syncopal episode.  Patient was at her baseline in the morning, went for hemodialysis. Her hemodialysis was also uneventful. After the hemodialysis patient Home and was fixing her lunch and felt dizzy and lightheaded.    PT Comments    Pt is currently Independent with mobility on the unit. Pt was able to ambulate with minimal challenges to balance Independently. Educated pt on transitioning from supine-sit-stand slowly due to orthostatic BP. Pt reports she is ready to d/c home. Pt is d/c from PT services secondary to all education is complete and pt is Independent with mobility. Pt no longer needs any follow-up PT.   Follow Up Recommendations  No PT follow up     Equipment Recommendations  None recommended by PT    Recommendations for Other Services       Precautions / Restrictions Restrictions Weight Bearing Restrictions: (P) No    Mobility  Bed Mobility Overal bed mobility: Independent                Transfers Overall transfer level: Independent                  Ambulation/Gait Ambulation/Gait assistance: Independent Ambulation Distance (Feet): 160 Feet Assistive device: None Gait Pattern/deviations: WFL(Within Functional Limits)   Gait velocity interpretation: at or above normal speed for age/gender     Stairs            Wheelchair Mobility    Modified Rankin (Stroke Patients Only)       Balance Overall balance assessment: No apparent balance deficits (not formally assessed)                                  Cognition Arousal/Alertness: (P) Awake/alert Behavior During  Therapy: (P) WFL for tasks assessed/performed Overall Cognitive Status: (P) Within Functional Limits for tasks assessed                      Exercises      General Comments General comments (skin integrity, edema, etc.): pt able to ambulate with head turns and no loss of balance noted. Pt educated on slowly transitioning from supine-sti-stand due to orthostatic BP. Pt is asymptomatic at this time.      Pertinent Vitals/Pain Pain Assessment: (P) Faces Faces Pain Scale: (P) Hurts little more Pain Location: (P) L heel Pain Descriptors / Indicators: (P) Sore Pain Intervention(s): (P) Monitored during session    Home Living Family/patient expects to be discharged to:: (P) Private residence Living Arrangements: (P) Children Available Help at Discharge: (P) Family;Available PRN/intermittently Type of Home: (P) Mobile home Home Access: (P) Stairs to enter Entrance Stairs-Rails: (P) Right;Left;Can reach both Home Layout: (P) One level Home Equipment: (P) Walker - 2 wheels;Cane - single point;Walker - 4 wheels;Bedside commode Additional Comments: (P) Pt states daughter can be with her 24 hours day but chart says daughter checks on pt 2 x week.     Prior Function Level of Independence: (P) Independent          PT Goals (current goals can now be found in the care plan section) Progress  towards PT goals: Goals met/education completed, patient discharged from PT    Frequency           PT Plan Discharge plan needs to be updated    Co-evaluation             End of Session   Activity Tolerance: Patient tolerated treatment well Patient left: in chair     Time: 1003-1015 PT Time Calculation (min) (ACUTE ONLY): 12 min  Charges:  $Gait Training: 8-22 mins                    G Codes:      Lelon Mast 11/29/2015, 10:24 AM

## 2015-11-29 NOTE — Evaluation (Signed)
Occupational Therapy Evaluation and Discharge Patient Details Name: [name] MRN: [id number] DOB: 1955/05/05 Today's Date: 11/29/2015    History of Present Illness [name] is a 60 y.o. female with Past medical history of ESRD on HD, liver transplant, nephrectomy, splenectomy, CAD, mechanical aortic valve, chronic Coumadin.  Patient presented with complaints of syncopal episode.  Patient was at her baseline in the morning, went for hemodialysis. Her hemodialysis was also uneventful. After the hemodialysis patient Home and was fixing her lunch and felt dizzy and lightheaded.   Clinical Impression   Pt is performing ADL and mobility independently. Instructed to slow pace when transitioning from supine>sit>standing for safety. No complaints of dizziness during visit including when she retrieved items from the floor. No OT needs.   Follow Up Recommendations  No OT follow up    Equipment Recommendations  None recommended by OT    Recommendations for Other Services       Precautions / Restrictions Restrictions Weight Bearing Restrictions: No      Mobility Bed Mobility Overal bed mobility: Independent                Transfers Overall transfer level: Independent                    Balance Overall balance assessment: No apparent balance deficits (not formally assessed)   Sitting balance-Leahy Scale: Good                                      ADL Overall ADL's : Independent                                             Vision     Perception     Praxis      Pertinent Vitals/Pain Pain Assessment: Faces Faces Pain Scale: Hurts little more Pain Location: L heel Pain Descriptors / Indicators: Sore Pain Intervention(s): Monitored during session     Hand Dominance Right   Extremity/Trunk Assessment Upper Extremity Assessment Upper Extremity Assessment: Overall WFL for tasks assessed   Lower Extremity  Assessment Lower Extremity Assessment: Defer to PT evaluation   Cervical / Trunk Assessment Cervical / Trunk Assessment: Normal   Communication Communication Communication: No difficulties   Cognition Arousal/Alertness: Awake/alert Behavior During Therapy: WFL for tasks assessed/performed Overall Cognitive Status: Within Functional Limits for tasks assessed                     General Comments       Exercises       Shoulder Instructions      Home Living Family/patient expects to be discharged to:: Private residence Living Arrangements: Children Available Help at Discharge: Family;Available PRN/intermittently Type of Home: Mobile home Home Access: Stairs to enter Entrance Stairs-Number of Steps: 5 Entrance Stairs-Rails: Right;Left;Can reach both Home Layout: One level     Bathroom Shower/Tub: Teacher, early years/pre: Standard Bathroom Accessibility: No   Home Equipment: Environmental consultant - 2 wheels;Cane - single point;Walker - 4 wheels;Bedside commode   Additional Comments: Pt states daughter can be with her 24 hours day but chart says daughter checks on pt 2 x week.       Prior Functioning/Environment Level of Independence: Independent  OT Problem List: Decreased activity tolerance   OT Treatment/Interventions:      OT Goals(Current goals can be found in the care plan section) Acute Rehab OT Goals Patient Stated Goal: to go to a family reunion tomorrow  OT Frequency:     Barriers to D/C:            Co-evaluation              End of Session Nurse Communication:  (orthostatic vitals)  Activity Tolerance: Patient tolerated treatment well Patient left: in chair;with call bell/phone within reach   Time: 1000-1018 OT Time Calculation (min): 18 min Charges:  OT General Charges $OT Visit: 1 Procedure OT Evaluation $OT Eval Moderate Complexity: 1 Procedure G-Codes:    [name] 11/29/2015, 10:25  AM [phone]

## 2015-11-29 NOTE — Progress Notes (Signed)
Pt has orders to be discharged. Discharge instructions given and pt has no additional questions at this time. Medication regimen reviewed and pt educated. Pt verbalized understanding and has no additional questions. Telemetry box removed. IV removed and site in good condition. Pt stable and waiting for transportation.   Caralina Nop RN 

## 2015-11-29 NOTE — Progress Notes (Signed)
  Echocardiogram 2D Echocardiogram has been performed.  Jennette Dubin 11/29/2015, 9:53 AM

## 2015-11-30 DIAGNOSIS — E118 Type 2 diabetes mellitus with unspecified complications: Secondary | ICD-10-CM | POA: Diagnosis not present

## 2015-11-30 DIAGNOSIS — D631 Anemia in chronic kidney disease: Secondary | ICD-10-CM | POA: Diagnosis not present

## 2015-11-30 DIAGNOSIS — N186 End stage renal disease: Secondary | ICD-10-CM | POA: Diagnosis not present

## 2015-11-30 DIAGNOSIS — E876 Hypokalemia: Secondary | ICD-10-CM | POA: Diagnosis not present

## 2015-11-30 DIAGNOSIS — I359 Nonrheumatic aortic valve disorder, unspecified: Secondary | ICD-10-CM | POA: Diagnosis not present

## 2015-11-30 LAB — PROTIME-INR: INR: 2.2 — AB (ref 0.9–1.1)

## 2015-12-03 ENCOUNTER — Ambulatory Visit (INDEPENDENT_AMBULATORY_CARE_PROVIDER_SITE_OTHER): Payer: Medicare Other | Admitting: Pharmacist

## 2015-12-03 DIAGNOSIS — E118 Type 2 diabetes mellitus with unspecified complications: Secondary | ICD-10-CM | POA: Diagnosis not present

## 2015-12-03 DIAGNOSIS — Z5181 Encounter for therapeutic drug level monitoring: Secondary | ICD-10-CM | POA: Diagnosis not present

## 2015-12-03 DIAGNOSIS — Z944 Liver transplant status: Secondary | ICD-10-CM | POA: Diagnosis not present

## 2015-12-03 DIAGNOSIS — E876 Hypokalemia: Secondary | ICD-10-CM | POA: Diagnosis not present

## 2015-12-03 DIAGNOSIS — Z01812 Encounter for preprocedural laboratory examination: Secondary | ICD-10-CM | POA: Diagnosis not present

## 2015-12-03 DIAGNOSIS — Z7901 Long term (current) use of anticoagulants: Secondary | ICD-10-CM

## 2015-12-03 DIAGNOSIS — D631 Anemia in chronic kidney disease: Secondary | ICD-10-CM | POA: Diagnosis not present

## 2015-12-03 DIAGNOSIS — N186 End stage renal disease: Secondary | ICD-10-CM | POA: Diagnosis not present

## 2015-12-03 DIAGNOSIS — Z952 Presence of prosthetic heart valve: Secondary | ICD-10-CM

## 2015-12-05 ENCOUNTER — Ambulatory Visit (INDEPENDENT_AMBULATORY_CARE_PROVIDER_SITE_OTHER): Payer: Medicare Other | Admitting: Pharmacist Clinician (PhC)/ Clinical Pharmacy Specialist

## 2015-12-05 DIAGNOSIS — Z952 Presence of prosthetic heart valve: Secondary | ICD-10-CM

## 2015-12-05 DIAGNOSIS — Z7901 Long term (current) use of anticoagulants: Secondary | ICD-10-CM

## 2015-12-05 DIAGNOSIS — Z944 Liver transplant status: Secondary | ICD-10-CM | POA: Diagnosis not present

## 2015-12-05 LAB — PROTIME-INR: INR: 3.3 — AB (ref <=1.1)

## 2015-12-06 DIAGNOSIS — E876 Hypokalemia: Secondary | ICD-10-CM | POA: Diagnosis not present

## 2015-12-06 DIAGNOSIS — I359 Nonrheumatic aortic valve disorder, unspecified: Secondary | ICD-10-CM | POA: Diagnosis not present

## 2015-12-06 DIAGNOSIS — E039 Hypothyroidism, unspecified: Secondary | ICD-10-CM | POA: Diagnosis not present

## 2015-12-06 DIAGNOSIS — D631 Anemia in chronic kidney disease: Secondary | ICD-10-CM | POA: Diagnosis not present

## 2015-12-06 DIAGNOSIS — N186 End stage renal disease: Secondary | ICD-10-CM | POA: Diagnosis not present

## 2015-12-06 DIAGNOSIS — E118 Type 2 diabetes mellitus with unspecified complications: Secondary | ICD-10-CM | POA: Diagnosis not present

## 2015-12-06 LAB — PROTIME-INR: INR: 2.8 — AB (ref 0.9–1.1)

## 2015-12-07 DIAGNOSIS — N186 End stage renal disease: Secondary | ICD-10-CM | POA: Diagnosis not present

## 2015-12-07 DIAGNOSIS — D631 Anemia in chronic kidney disease: Secondary | ICD-10-CM | POA: Diagnosis not present

## 2015-12-07 DIAGNOSIS — Z944 Liver transplant status: Secondary | ICD-10-CM | POA: Diagnosis not present

## 2015-12-07 DIAGNOSIS — E876 Hypokalemia: Secondary | ICD-10-CM | POA: Diagnosis not present

## 2015-12-07 DIAGNOSIS — K759 Inflammatory liver disease, unspecified: Secondary | ICD-10-CM | POA: Diagnosis not present

## 2015-12-07 DIAGNOSIS — R740 Nonspecific elevation of levels of transaminase and lactic acid dehydrogenase [LDH]: Secondary | ICD-10-CM | POA: Diagnosis not present

## 2015-12-07 DIAGNOSIS — K731 Chronic lobular hepatitis, not elsewhere classified: Secondary | ICD-10-CM | POA: Diagnosis not present

## 2015-12-07 DIAGNOSIS — E118 Type 2 diabetes mellitus with unspecified complications: Secondary | ICD-10-CM | POA: Diagnosis not present

## 2015-12-10 ENCOUNTER — Ambulatory Visit (INDEPENDENT_AMBULATORY_CARE_PROVIDER_SITE_OTHER): Payer: Medicare Other | Admitting: Pharmacist Clinician (PhC)/ Clinical Pharmacy Specialist

## 2015-12-10 DIAGNOSIS — Z7901 Long term (current) use of anticoagulants: Secondary | ICD-10-CM

## 2015-12-10 DIAGNOSIS — N186 End stage renal disease: Secondary | ICD-10-CM | POA: Diagnosis not present

## 2015-12-10 DIAGNOSIS — E118 Type 2 diabetes mellitus with unspecified complications: Secondary | ICD-10-CM | POA: Diagnosis not present

## 2015-12-10 DIAGNOSIS — D631 Anemia in chronic kidney disease: Secondary | ICD-10-CM | POA: Diagnosis not present

## 2015-12-10 DIAGNOSIS — Z952 Presence of prosthetic heart valve: Secondary | ICD-10-CM

## 2015-12-10 DIAGNOSIS — E876 Hypokalemia: Secondary | ICD-10-CM | POA: Diagnosis not present

## 2015-12-11 DIAGNOSIS — Z992 Dependence on renal dialysis: Secondary | ICD-10-CM | POA: Diagnosis not present

## 2015-12-11 DIAGNOSIS — N186 End stage renal disease: Secondary | ICD-10-CM | POA: Diagnosis not present

## 2015-12-11 DIAGNOSIS — T8640 Unspecified complication of liver transplant: Secondary | ICD-10-CM | POA: Diagnosis not present

## 2015-12-12 ENCOUNTER — Telehealth: Payer: Self-pay | Admitting: Cardiovascular Disease

## 2015-12-12 DIAGNOSIS — N2581 Secondary hyperparathyroidism of renal origin: Secondary | ICD-10-CM | POA: Diagnosis not present

## 2015-12-12 DIAGNOSIS — E118 Type 2 diabetes mellitus with unspecified complications: Secondary | ICD-10-CM | POA: Diagnosis not present

## 2015-12-12 DIAGNOSIS — N186 End stage renal disease: Secondary | ICD-10-CM | POA: Diagnosis not present

## 2015-12-12 DIAGNOSIS — E876 Hypokalemia: Secondary | ICD-10-CM | POA: Diagnosis not present

## 2015-12-12 DIAGNOSIS — I359 Nonrheumatic aortic valve disorder, unspecified: Secondary | ICD-10-CM | POA: Diagnosis not present

## 2015-12-12 LAB — PROTIME-INR: INR: 1.9 — AB (ref 0.9–1.1)

## 2015-12-12 NOTE — Telephone Encounter (Signed)
[name] is calling to add an addendum to office notes states pt still needs oxygen

## 2015-12-12 NOTE — Telephone Encounter (Signed)
LEFT MESSAGE TO CALL BACK --NOT SURE IF INFORMATION IS FOR OUR KNOWLEDGE OR IF SOME TYPE OF ORDER IS NEEDED.

## 2015-12-13 DIAGNOSIS — I10 Essential (primary) hypertension: Secondary | ICD-10-CM | POA: Diagnosis not present

## 2015-12-13 DIAGNOSIS — J449 Chronic obstructive pulmonary disease, unspecified: Secondary | ICD-10-CM | POA: Diagnosis not present

## 2015-12-13 DIAGNOSIS — K7460 Unspecified cirrhosis of liver: Secondary | ICD-10-CM | POA: Diagnosis not present

## 2015-12-13 DIAGNOSIS — M6281 Muscle weakness (generalized): Secondary | ICD-10-CM | POA: Diagnosis not present

## 2015-12-14 ENCOUNTER — Ambulatory Visit (INDEPENDENT_AMBULATORY_CARE_PROVIDER_SITE_OTHER): Payer: Medicare Other | Admitting: Pharmacist

## 2015-12-14 DIAGNOSIS — Z7901 Long term (current) use of anticoagulants: Secondary | ICD-10-CM

## 2015-12-14 DIAGNOSIS — N186 End stage renal disease: Secondary | ICD-10-CM | POA: Diagnosis not present

## 2015-12-14 DIAGNOSIS — N2581 Secondary hyperparathyroidism of renal origin: Secondary | ICD-10-CM | POA: Diagnosis not present

## 2015-12-14 DIAGNOSIS — Z952 Presence of prosthetic heart valve: Secondary | ICD-10-CM

## 2015-12-14 DIAGNOSIS — E118 Type 2 diabetes mellitus with unspecified complications: Secondary | ICD-10-CM | POA: Diagnosis not present

## 2015-12-14 DIAGNOSIS — E876 Hypokalemia: Secondary | ICD-10-CM | POA: Diagnosis not present

## 2015-12-17 DIAGNOSIS — N2581 Secondary hyperparathyroidism of renal origin: Secondary | ICD-10-CM | POA: Diagnosis not present

## 2015-12-17 DIAGNOSIS — B182 Chronic viral hepatitis C: Secondary | ICD-10-CM | POA: Diagnosis not present

## 2015-12-17 DIAGNOSIS — Z944 Liver transplant status: Secondary | ICD-10-CM | POA: Diagnosis not present

## 2015-12-17 DIAGNOSIS — E876 Hypokalemia: Secondary | ICD-10-CM | POA: Diagnosis not present

## 2015-12-17 DIAGNOSIS — N186 End stage renal disease: Secondary | ICD-10-CM | POA: Diagnosis not present

## 2015-12-17 DIAGNOSIS — E118 Type 2 diabetes mellitus with unspecified complications: Secondary | ICD-10-CM | POA: Diagnosis not present

## 2015-12-19 DIAGNOSIS — I359 Nonrheumatic aortic valve disorder, unspecified: Secondary | ICD-10-CM | POA: Diagnosis not present

## 2015-12-19 DIAGNOSIS — N2581 Secondary hyperparathyroidism of renal origin: Secondary | ICD-10-CM | POA: Diagnosis not present

## 2015-12-19 DIAGNOSIS — E876 Hypokalemia: Secondary | ICD-10-CM | POA: Diagnosis not present

## 2015-12-19 DIAGNOSIS — E118 Type 2 diabetes mellitus with unspecified complications: Secondary | ICD-10-CM | POA: Diagnosis not present

## 2015-12-19 DIAGNOSIS — N186 End stage renal disease: Secondary | ICD-10-CM | POA: Diagnosis not present

## 2015-12-19 LAB — PROTIME-INR: INR: 2.7 — AB (ref 0.9–1.1)

## 2015-12-20 ENCOUNTER — Telehealth: Payer: Self-pay | Admitting: Cardiovascular Disease

## 2015-12-20 DIAGNOSIS — Z5181 Encounter for therapeutic drug level monitoring: Secondary | ICD-10-CM | POA: Diagnosis not present

## 2015-12-20 DIAGNOSIS — Z7901 Long term (current) use of anticoagulants: Secondary | ICD-10-CM | POA: Diagnosis not present

## 2015-12-20 DIAGNOSIS — Z9081 Acquired absence of spleen: Secondary | ICD-10-CM | POA: Diagnosis not present

## 2015-12-20 DIAGNOSIS — N250 Renal osteodystrophy: Secondary | ICD-10-CM | POA: Diagnosis not present

## 2015-12-20 DIAGNOSIS — K831 Obstruction of bile duct: Secondary | ICD-10-CM | POA: Diagnosis present

## 2015-12-20 DIAGNOSIS — I81 Portal vein thrombosis: Secondary | ICD-10-CM | POA: Diagnosis not present

## 2015-12-20 DIAGNOSIS — B1920 Unspecified viral hepatitis C without hepatic coma: Secondary | ICD-10-CM | POA: Diagnosis present

## 2015-12-20 DIAGNOSIS — I120 Hypertensive chronic kidney disease with stage 5 chronic kidney disease or end stage renal disease: Secondary | ICD-10-CM | POA: Diagnosis not present

## 2015-12-20 DIAGNOSIS — J449 Chronic obstructive pulmonary disease, unspecified: Secondary | ICD-10-CM | POA: Diagnosis not present

## 2015-12-20 DIAGNOSIS — Z7982 Long term (current) use of aspirin: Secondary | ICD-10-CM | POA: Diagnosis not present

## 2015-12-20 DIAGNOSIS — E1122 Type 2 diabetes mellitus with diabetic chronic kidney disease: Secondary | ICD-10-CM | POA: Diagnosis not present

## 2015-12-20 DIAGNOSIS — N281 Cyst of kidney, acquired: Secondary | ICD-10-CM | POA: Diagnosis not present

## 2015-12-20 DIAGNOSIS — T8641 Liver transplant rejection: Secondary | ICD-10-CM | POA: Diagnosis not present

## 2015-12-20 DIAGNOSIS — R109 Unspecified abdominal pain: Secondary | ICD-10-CM | POA: Diagnosis not present

## 2015-12-20 DIAGNOSIS — Z794 Long term (current) use of insulin: Secondary | ICD-10-CM | POA: Diagnosis not present

## 2015-12-20 DIAGNOSIS — Z6822 Body mass index (BMI) 22.0-22.9, adult: Secondary | ICD-10-CM | POA: Diagnosis not present

## 2015-12-20 DIAGNOSIS — R748 Abnormal levels of other serum enzymes: Secondary | ICD-10-CM | POA: Diagnosis not present

## 2015-12-20 DIAGNOSIS — Z79899 Other long term (current) drug therapy: Secondary | ICD-10-CM | POA: Diagnosis not present

## 2015-12-20 DIAGNOSIS — Z952 Presence of prosthetic heart valve: Secondary | ICD-10-CM | POA: Diagnosis not present

## 2015-12-20 DIAGNOSIS — R079 Chest pain, unspecified: Secondary | ICD-10-CM | POA: Diagnosis not present

## 2015-12-20 DIAGNOSIS — I132 Hypertensive heart and chronic kidney disease with heart failure and with stage 5 chronic kidney disease, or end stage renal disease: Secondary | ICD-10-CM | POA: Diagnosis present

## 2015-12-20 DIAGNOSIS — R739 Hyperglycemia, unspecified: Secondary | ICD-10-CM | POA: Diagnosis not present

## 2015-12-20 DIAGNOSIS — T380X5A Adverse effect of glucocorticoids and synthetic analogues, initial encounter: Secondary | ICD-10-CM | POA: Diagnosis not present

## 2015-12-20 DIAGNOSIS — I10 Essential (primary) hypertension: Secondary | ICD-10-CM | POA: Diagnosis not present

## 2015-12-20 DIAGNOSIS — I517 Cardiomegaly: Secondary | ICD-10-CM | POA: Diagnosis not present

## 2015-12-20 DIAGNOSIS — Z992 Dependence on renal dialysis: Secondary | ICD-10-CM | POA: Diagnosis not present

## 2015-12-20 DIAGNOSIS — I509 Heart failure, unspecified: Secondary | ICD-10-CM | POA: Diagnosis present

## 2015-12-20 DIAGNOSIS — K838 Other specified diseases of biliary tract: Secondary | ICD-10-CM | POA: Diagnosis not present

## 2015-12-20 DIAGNOSIS — Z944 Liver transplant status: Secondary | ICD-10-CM | POA: Diagnosis not present

## 2015-12-20 DIAGNOSIS — Z66 Do not resuscitate: Secondary | ICD-10-CM | POA: Diagnosis present

## 2015-12-20 DIAGNOSIS — N186 End stage renal disease: Secondary | ICD-10-CM | POA: Diagnosis not present

## 2015-12-20 DIAGNOSIS — L89620 Pressure ulcer of left heel, unstageable: Secondary | ICD-10-CM | POA: Diagnosis present

## 2015-12-20 DIAGNOSIS — K7290 Hepatic failure, unspecified without coma: Secondary | ICD-10-CM | POA: Diagnosis not present

## 2015-12-20 DIAGNOSIS — K9189 Other postprocedural complications and disorders of digestive system: Secondary | ICD-10-CM | POA: Diagnosis present

## 2015-12-20 DIAGNOSIS — E039 Hypothyroidism, unspecified: Secondary | ICD-10-CM | POA: Diagnosis present

## 2015-12-20 DIAGNOSIS — J90 Pleural effusion, not elsewhere classified: Secondary | ICD-10-CM | POA: Diagnosis not present

## 2015-12-20 DIAGNOSIS — D649 Anemia, unspecified: Secondary | ICD-10-CM | POA: Diagnosis not present

## 2015-12-20 DIAGNOSIS — J9811 Atelectasis: Secondary | ICD-10-CM | POA: Diagnosis not present

## 2015-12-20 DIAGNOSIS — Z01818 Encounter for other preprocedural examination: Secondary | ICD-10-CM | POA: Diagnosis not present

## 2015-12-20 DIAGNOSIS — Z87891 Personal history of nicotine dependence: Secondary | ICD-10-CM | POA: Diagnosis not present

## 2015-12-20 NOTE — Telephone Encounter (Signed)
Message sent to Dr. kelly 

## 2015-12-20 NOTE — Telephone Encounter (Signed)
New message       Nurse is trying to recertify pt for oxygen with medicare.  She is requesting Dr [name] write an addendum to the 07-04-15 office note stating pt is continuing to use oxygen. Please fax updated note to [phone] attn Jessica R.  If any questions, please call

## 2015-12-21 ENCOUNTER — Ambulatory Visit (INDEPENDENT_AMBULATORY_CARE_PROVIDER_SITE_OTHER): Payer: Medicare Other | Admitting: Pharmacist

## 2015-12-21 DIAGNOSIS — Z952 Presence of prosthetic heart valve: Secondary | ICD-10-CM

## 2015-12-21 DIAGNOSIS — Z7901 Long term (current) use of anticoagulants: Secondary | ICD-10-CM

## 2015-12-28 ENCOUNTER — Telehealth: Payer: Self-pay | Admitting: Pharmacist

## 2015-12-28 DIAGNOSIS — N186 End stage renal disease: Secondary | ICD-10-CM | POA: Diagnosis not present

## 2015-12-28 DIAGNOSIS — E118 Type 2 diabetes mellitus with unspecified complications: Secondary | ICD-10-CM | POA: Diagnosis not present

## 2015-12-28 DIAGNOSIS — N2581 Secondary hyperparathyroidism of renal origin: Secondary | ICD-10-CM | POA: Diagnosis not present

## 2015-12-28 DIAGNOSIS — I359 Nonrheumatic aortic valve disorder, unspecified: Secondary | ICD-10-CM | POA: Diagnosis not present

## 2015-12-28 DIAGNOSIS — E876 Hypokalemia: Secondary | ICD-10-CM | POA: Diagnosis not present

## 2015-12-28 LAB — PROTIME-INR: INR: 1.5 — AB (ref 0.9–1.1)

## 2015-12-28 MED ORDER — ENOXAPARIN SODIUM 40 MG/0.4ML ~~LOC~~ SOLN: 40.0000 mg | SUBCUTANEOUS | 1 refills | Status: DC

## 2015-12-28 NOTE — Telephone Encounter (Signed)
We again discussed that this is unconventional dosing given her dialysis. Pt and daughter are aware.

## 2015-12-28 NOTE — Telephone Encounter (Signed)
[name] called asking for lovenox injections to cover patient until INR therapeutic. They are unable to obtain the heparin as recommended by [facility]. She has used lovenox injections in the past to cover while off coumadin given complicated history. She previously did 40mg  daily. And she will resume at this dose until Monday.   PT did resume her warfarin after procedure on Wednesday will send Order for INR check on Monday with dialysis.

## 2015-12-31 DIAGNOSIS — E118 Type 2 diabetes mellitus with unspecified complications: Secondary | ICD-10-CM | POA: Diagnosis not present

## 2015-12-31 DIAGNOSIS — N2581 Secondary hyperparathyroidism of renal origin: Secondary | ICD-10-CM | POA: Diagnosis not present

## 2015-12-31 DIAGNOSIS — I359 Nonrheumatic aortic valve disorder, unspecified: Secondary | ICD-10-CM | POA: Diagnosis not present

## 2015-12-31 DIAGNOSIS — N186 End stage renal disease: Secondary | ICD-10-CM | POA: Diagnosis not present

## 2015-12-31 DIAGNOSIS — E876 Hypokalemia: Secondary | ICD-10-CM | POA: Diagnosis not present

## 2015-12-31 LAB — PROTIME-INR: INR: 2.2 — AB (ref 0.9–1.1)

## 2016-01-01 ENCOUNTER — Ambulatory Visit (INDEPENDENT_AMBULATORY_CARE_PROVIDER_SITE_OTHER): Payer: Medicare Other | Admitting: Pharmacist

## 2016-01-01 DIAGNOSIS — Q8901 Asplenia (congenital): Secondary | ICD-10-CM | POA: Diagnosis not present

## 2016-01-01 DIAGNOSIS — Z944 Liver transplant status: Secondary | ICD-10-CM | POA: Diagnosis not present

## 2016-01-01 DIAGNOSIS — Z23 Encounter for immunization: Secondary | ICD-10-CM | POA: Diagnosis not present

## 2016-01-01 DIAGNOSIS — Z7901 Long term (current) use of anticoagulants: Secondary | ICD-10-CM

## 2016-01-01 DIAGNOSIS — Z952 Presence of prosthetic heart valve: Secondary | ICD-10-CM

## 2016-01-02 DIAGNOSIS — E118 Type 2 diabetes mellitus with unspecified complications: Secondary | ICD-10-CM | POA: Diagnosis not present

## 2016-01-02 DIAGNOSIS — N2581 Secondary hyperparathyroidism of renal origin: Secondary | ICD-10-CM | POA: Diagnosis not present

## 2016-01-02 DIAGNOSIS — N186 End stage renal disease: Secondary | ICD-10-CM | POA: Diagnosis not present

## 2016-01-02 DIAGNOSIS — I359 Nonrheumatic aortic valve disorder, unspecified: Secondary | ICD-10-CM | POA: Diagnosis not present

## 2016-01-02 DIAGNOSIS — E876 Hypokalemia: Secondary | ICD-10-CM | POA: Diagnosis not present

## 2016-01-02 LAB — PROTIME-INR: INR: 2 — AB (ref 0.9–1.1)

## 2016-01-04 DIAGNOSIS — N2581 Secondary hyperparathyroidism of renal origin: Secondary | ICD-10-CM | POA: Diagnosis not present

## 2016-01-04 DIAGNOSIS — E118 Type 2 diabetes mellitus with unspecified complications: Secondary | ICD-10-CM | POA: Diagnosis not present

## 2016-01-04 DIAGNOSIS — N186 End stage renal disease: Secondary | ICD-10-CM | POA: Diagnosis not present

## 2016-01-04 DIAGNOSIS — E876 Hypokalemia: Secondary | ICD-10-CM | POA: Diagnosis not present

## 2016-01-07 ENCOUNTER — Ambulatory Visit (INDEPENDENT_AMBULATORY_CARE_PROVIDER_SITE_OTHER): Payer: Medicare Other | Admitting: Pharmacist Clinician (PhC)/ Clinical Pharmacy Specialist

## 2016-01-07 DIAGNOSIS — N2581 Secondary hyperparathyroidism of renal origin: Secondary | ICD-10-CM | POA: Diagnosis not present

## 2016-01-07 DIAGNOSIS — I359 Nonrheumatic aortic valve disorder, unspecified: Secondary | ICD-10-CM | POA: Diagnosis not present

## 2016-01-07 DIAGNOSIS — Z944 Liver transplant status: Secondary | ICD-10-CM | POA: Diagnosis not present

## 2016-01-07 DIAGNOSIS — N186 End stage renal disease: Secondary | ICD-10-CM | POA: Diagnosis not present

## 2016-01-07 DIAGNOSIS — E118 Type 2 diabetes mellitus with unspecified complications: Secondary | ICD-10-CM | POA: Diagnosis not present

## 2016-01-07 DIAGNOSIS — Z5181 Encounter for therapeutic drug level monitoring: Secondary | ICD-10-CM | POA: Diagnosis not present

## 2016-01-07 DIAGNOSIS — E876 Hypokalemia: Secondary | ICD-10-CM | POA: Diagnosis not present

## 2016-01-07 DIAGNOSIS — Z952 Presence of prosthetic heart valve: Secondary | ICD-10-CM

## 2016-01-07 DIAGNOSIS — Z7901 Long term (current) use of anticoagulants: Secondary | ICD-10-CM

## 2016-01-07 LAB — PROTIME-INR: INR: 2.8 — AB (ref 0.9–1.1)

## 2016-01-09 DIAGNOSIS — E876 Hypokalemia: Secondary | ICD-10-CM | POA: Diagnosis not present

## 2016-01-09 DIAGNOSIS — N2581 Secondary hyperparathyroidism of renal origin: Secondary | ICD-10-CM | POA: Diagnosis not present

## 2016-01-09 DIAGNOSIS — N186 End stage renal disease: Secondary | ICD-10-CM | POA: Diagnosis not present

## 2016-01-09 DIAGNOSIS — E118 Type 2 diabetes mellitus with unspecified complications: Secondary | ICD-10-CM | POA: Diagnosis not present

## 2016-01-10 ENCOUNTER — Ambulatory Visit (INDEPENDENT_AMBULATORY_CARE_PROVIDER_SITE_OTHER): Payer: Medicare Other | Admitting: Pharmacist Clinician (PhC)/ Clinical Pharmacy Specialist

## 2016-01-10 DIAGNOSIS — Z7901 Long term (current) use of anticoagulants: Secondary | ICD-10-CM

## 2016-01-10 DIAGNOSIS — N186 End stage renal disease: Secondary | ICD-10-CM | POA: Diagnosis not present

## 2016-01-10 DIAGNOSIS — T8640 Unspecified complication of liver transplant: Secondary | ICD-10-CM | POA: Diagnosis not present

## 2016-01-10 DIAGNOSIS — Z992 Dependence on renal dialysis: Secondary | ICD-10-CM | POA: Diagnosis not present

## 2016-01-10 DIAGNOSIS — Z952 Presence of prosthetic heart valve: Secondary | ICD-10-CM

## 2016-01-11 DIAGNOSIS — E118 Type 2 diabetes mellitus with unspecified complications: Secondary | ICD-10-CM | POA: Diagnosis not present

## 2016-01-11 DIAGNOSIS — N186 End stage renal disease: Secondary | ICD-10-CM | POA: Diagnosis not present

## 2016-01-11 DIAGNOSIS — E876 Hypokalemia: Secondary | ICD-10-CM | POA: Diagnosis not present

## 2016-01-11 DIAGNOSIS — N2581 Secondary hyperparathyroidism of renal origin: Secondary | ICD-10-CM | POA: Diagnosis not present

## 2016-01-14 DIAGNOSIS — I359 Nonrheumatic aortic valve disorder, unspecified: Secondary | ICD-10-CM | POA: Diagnosis not present

## 2016-01-14 DIAGNOSIS — N2581 Secondary hyperparathyroidism of renal origin: Secondary | ICD-10-CM | POA: Diagnosis not present

## 2016-01-14 DIAGNOSIS — N186 End stage renal disease: Secondary | ICD-10-CM | POA: Diagnosis not present

## 2016-01-14 DIAGNOSIS — E876 Hypokalemia: Secondary | ICD-10-CM | POA: Diagnosis not present

## 2016-01-14 DIAGNOSIS — E118 Type 2 diabetes mellitus with unspecified complications: Secondary | ICD-10-CM | POA: Diagnosis not present

## 2016-01-14 LAB — PROTIME-INR: INR: 2.1 — AB (ref 0.9–1.1)

## 2016-01-15 ENCOUNTER — Telehealth: Payer: Self-pay | Admitting: Cardiovascular Disease

## 2016-01-15 NOTE — Telephone Encounter (Signed)
Pt of Dr. [name] Hx of SVT  I spoke to [place], daughter and DPR. She voiced concern that patient has been having breakthrough episodes of SVT, which she has not had in a couple of years. She had two episodes yesterday. States that the 1st occurred while she was in dialysis, and she was advised by dialysis staff to get evaluation in ER. She had a 2nd event shortly afterwards. She did not go to hospital. These resolved on their own after about 15 mins - no SOB, no chest pain, no syncope/presyncope but she was symptomatic w dizziness at the time, felt off balance. Aware some of the balance issues could be related to fluid shifts w dialysis.    Called today for appt and was scheduled by staff member to see Dr. [name] on available appt slot in 2 days.  I advised that it would be fine to keep this appt and be evaluated, but if patient has a return of her symptoms and the SVT does not resolve after 15-20 mins, she should go to the emergency room. Caller voiced acknowledgment of these instructions, and thanks.  Routing msg to Dr. [name] for review.

## 2016-01-16 ENCOUNTER — Telehealth: Payer: Self-pay | Admitting: *Deleted

## 2016-01-16 DIAGNOSIS — E118 Type 2 diabetes mellitus with unspecified complications: Secondary | ICD-10-CM | POA: Diagnosis not present

## 2016-01-16 DIAGNOSIS — E876 Hypokalemia: Secondary | ICD-10-CM | POA: Diagnosis not present

## 2016-01-16 DIAGNOSIS — N2581 Secondary hyperparathyroidism of renal origin: Secondary | ICD-10-CM | POA: Diagnosis not present

## 2016-01-16 DIAGNOSIS — N186 End stage renal disease: Secondary | ICD-10-CM | POA: Diagnosis not present

## 2016-01-16 NOTE — Telephone Encounter (Signed)
Spoke to [employer] [facility]. [name] is attempting to re certify patient for oxygen  Usage.  Would like an addendum added to office visit 07/04/15- stating if patient is still using oxygen - if evaluated. ( only document usages)  Please contact [name]- when completed. [place] aware will defer to Dr [name], and wanda cma

## 2016-01-17 ENCOUNTER — Ambulatory Visit (INDEPENDENT_AMBULATORY_CARE_PROVIDER_SITE_OTHER): Payer: Medicare Other | Admitting: Cardiovascular Disease

## 2016-01-17 ENCOUNTER — Encounter: Payer: Self-pay | Admitting: Cardiovascular Disease

## 2016-01-17 VITALS — BP 160/60 | HR 63 | Ht 64.5 in | Wt 131.4 lb

## 2016-01-17 DIAGNOSIS — I471 Supraventricular tachycardia: Secondary | ICD-10-CM | POA: Diagnosis not present

## 2016-01-17 DIAGNOSIS — Z952 Presence of prosthetic heart valve: Secondary | ICD-10-CM

## 2016-01-17 DIAGNOSIS — E038 Other specified hypothyroidism: Secondary | ICD-10-CM

## 2016-01-17 DIAGNOSIS — I209 Angina pectoris, unspecified: Secondary | ICD-10-CM | POA: Diagnosis not present

## 2016-01-17 DIAGNOSIS — Z951 Presence of aortocoronary bypass graft: Secondary | ICD-10-CM

## 2016-01-17 DIAGNOSIS — G4733 Obstructive sleep apnea (adult) (pediatric): Secondary | ICD-10-CM

## 2016-01-17 DIAGNOSIS — N186 End stage renal disease: Secondary | ICD-10-CM

## 2016-01-17 DIAGNOSIS — I10 Essential (primary) hypertension: Secondary | ICD-10-CM | POA: Diagnosis not present

## 2016-01-17 DIAGNOSIS — I25708 Atherosclerosis of coronary artery bypass graft(s), unspecified, with other forms of angina pectoris: Secondary | ICD-10-CM

## 2016-01-17 DIAGNOSIS — Z992 Dependence on renal dialysis: Secondary | ICD-10-CM

## 2016-01-17 LAB — CBC
HEMATOCRIT: 36.6 % (ref 35.0–45.0)
Hemoglobin: 11.8 g/dL (ref 11.7–15.5)
MCH: 31.6 pg (ref 27.0–33.0)
MCHC: 32.2 g/dL (ref 32.0–36.0)
MCV: 97.9 fL (ref 80.0–100.0)
MPV: 12.2 fL (ref 7.5–12.5)
PLATELETS: 215 10*3/uL (ref 140–400)
RBC: 3.74 MIL/uL — AB (ref 3.80–5.10)
RDW: 16.8 % — AB (ref 11.0–15.0)
WBC: 7.8 10*3/uL (ref 3.8–10.8)

## 2016-01-17 LAB — TSH: TSH: 17.21 mIU/L — ABNORMAL HIGH

## 2016-01-17 NOTE — Patient Instructions (Signed)
Your physician recommends that you return for lab work.  Your physician has recommended you make the following change in your medication:   1.) the lopressor has been increased to 25 mg twice a day.  Your physician recommends that you schedule a follow-up appointment in: 3 months with Dr [name].

## 2016-01-18 ENCOUNTER — Ambulatory Visit (INDEPENDENT_AMBULATORY_CARE_PROVIDER_SITE_OTHER): Payer: Medicare Other | Admitting: Pharmacist

## 2016-01-18 DIAGNOSIS — N186 End stage renal disease: Secondary | ICD-10-CM | POA: Diagnosis not present

## 2016-01-18 DIAGNOSIS — Z944 Liver transplant status: Secondary | ICD-10-CM | POA: Diagnosis not present

## 2016-01-18 DIAGNOSIS — N2581 Secondary hyperparathyroidism of renal origin: Secondary | ICD-10-CM | POA: Diagnosis not present

## 2016-01-18 DIAGNOSIS — E118 Type 2 diabetes mellitus with unspecified complications: Secondary | ICD-10-CM | POA: Diagnosis not present

## 2016-01-18 DIAGNOSIS — E876 Hypokalemia: Secondary | ICD-10-CM | POA: Diagnosis not present

## 2016-01-18 DIAGNOSIS — Z5181 Encounter for therapeutic drug level monitoring: Secondary | ICD-10-CM | POA: Diagnosis not present

## 2016-01-18 DIAGNOSIS — Z952 Presence of prosthetic heart valve: Secondary | ICD-10-CM

## 2016-01-18 DIAGNOSIS — Z7901 Long term (current) use of anticoagulants: Secondary | ICD-10-CM

## 2016-01-18 LAB — COMPREHENSIVE METABOLIC PANEL
ALK PHOS: 420 U/L — AB (ref 33–130)
ALT: 48 U/L — AB (ref 6–29)
AST: 71 U/L — AB (ref 10–35)
Albumin: 3.1 g/dL — ABNORMAL LOW (ref 3.6–5.1)
BILIRUBIN TOTAL: 2.9 mg/dL — AB (ref 0.2–1.2)
BUN: 27 mg/dL — AB (ref 7–25)
CO2: 26 mmol/L (ref 20–31)
Calcium: 8.8 mg/dL (ref 8.6–10.4)
Chloride: 92 mmol/L — ABNORMAL LOW (ref 98–110)
Creat: 6.42 mg/dL — ABNORMAL HIGH (ref 0.50–0.99)
GLUCOSE: 113 mg/dL — AB (ref 65–99)
POTASSIUM: 4.1 mmol/L (ref 3.5–5.3)
Sodium: 136 mmol/L (ref 135–146)
TOTAL PROTEIN: 6.7 g/dL (ref 6.1–8.1)

## 2016-01-18 LAB — MAGNESIUM: MAGNESIUM: 1.9 mg/dL (ref 1.5–2.5)

## 2016-01-21 DIAGNOSIS — E118 Type 2 diabetes mellitus with unspecified complications: Secondary | ICD-10-CM | POA: Diagnosis not present

## 2016-01-21 DIAGNOSIS — N186 End stage renal disease: Secondary | ICD-10-CM | POA: Diagnosis not present

## 2016-01-21 DIAGNOSIS — N2581 Secondary hyperparathyroidism of renal origin: Secondary | ICD-10-CM | POA: Diagnosis not present

## 2016-01-21 DIAGNOSIS — E876 Hypokalemia: Secondary | ICD-10-CM | POA: Diagnosis not present

## 2016-01-21 NOTE — Telephone Encounter (Signed)
Follow Up   [name] from [place] is calling to follow up on getting an addendum added to office visit 07/04/15 - stating that the patientis still using oxygen .   Please contact Jessica at [phone] ext [phone]

## 2016-01-23 DIAGNOSIS — E876 Hypokalemia: Secondary | ICD-10-CM | POA: Diagnosis not present

## 2016-01-23 DIAGNOSIS — M79672 Pain in left foot: Secondary | ICD-10-CM | POA: Diagnosis not present

## 2016-01-23 DIAGNOSIS — I359 Nonrheumatic aortic valve disorder, unspecified: Secondary | ICD-10-CM | POA: Diagnosis not present

## 2016-01-23 DIAGNOSIS — L97421 Non-pressure chronic ulcer of left heel and midfoot limited to breakdown of skin: Secondary | ICD-10-CM | POA: Diagnosis not present

## 2016-01-23 DIAGNOSIS — N2581 Secondary hyperparathyroidism of renal origin: Secondary | ICD-10-CM | POA: Diagnosis not present

## 2016-01-23 DIAGNOSIS — I739 Peripheral vascular disease, unspecified: Secondary | ICD-10-CM | POA: Diagnosis not present

## 2016-01-23 DIAGNOSIS — N186 End stage renal disease: Secondary | ICD-10-CM | POA: Diagnosis not present

## 2016-01-23 DIAGNOSIS — E118 Type 2 diabetes mellitus with unspecified complications: Secondary | ICD-10-CM | POA: Diagnosis not present

## 2016-01-23 LAB — PROTIME-INR: INR: 2.7 — AB (ref 0.9–1.1)

## 2016-01-23 NOTE — Progress Notes (Signed)
Patient ID: [name], female   DOB: 1955/12/03, 60 y.o.   MRN: [id number]    HPI: [name] is a 60 y.o. African American  female who presents to the office today for a 6 month follow up cardiology evaluation.  Ms. Bertagnolli has end-stage renal disease and is on chronic hemodialysis.  After undergoing liver transplantation at [facility] for hepatitis C,  she was admitted to [facility] in January 2015 with increasing chest pain.  A dobutamine echo study suggested severe AS with an ejection fraction of 45%.  Cardiac catheterization revealed mild CAD with 50% narrowing in moderately severe aortic stenosis with aortic valve area in the range of 0.8-1.2.  She ultimately underwent aortic valve replacement with mechanical aortic valve and single vessel LIMA to LAD CABG revascularization surgery on 03/15/2013 by Dr. [name].  Following discharge, she presented to the hospital for further evaluation of chest pain and palpitations.  Troponins were negative.  Beta blocker therapy was increased to following discharge.   She undergoes dialysis on Tuesday, Thursdays, and Saturdays.  She was  hospitalized from November 5 through 12/21/2013 and presented with chest pain episodes which occurred during dialysis.  Her ECG showed new T-wave inversion in inferior leads.  She underwent a repeat cardiac catheterization on November 6 by me which showed a focal 70% mid LAD stenosis after the takeoff of the second diagonal vessel, but the LIMA graft to the mid LAD was patent.  The left circumflex vessel had 20% smooth narrowing in the second marginal branch.  The large dominant RCA was normal.  Medical therapy was recommended.  She was re-coumadinized prior to discharge.  She was found to have AV graft bleeding/degeneration and underwent hemodialysis graft revision of her left thigh AV graft.  She was on heparin therapy during that time and ultimately was restarted back on Coumadin.  She was hospitalized from  05/11/2014 through 05/20/2014 on the vascular service with complications due to renal dialysis device implant and graft.  She also was seen in the emergency room with paroxysmal SVT which was treated with adenosine in 06/09/2014 and was seen on 06/17/2014 with mild chest pain and palpitations.    She underwent a sleep study to evaluate for sleep apnea.  She sleeps very poorly, admits to fatigue and daytime sleepiness.  Her sleep study was abnormal and demonstrated a significant increase in the respiratory disturbance index at 61.3/h, although the AHI was calculated at just 3.7/h.  She had a total of 9 obstructive apneas, 10 central apneas and 1 mixed apnea.  Her sleep architecture was abnormal with absence of slow-wave sleep and prolonged latency 2 REM sleep.  She had moderate snoring.  She did not have significant oxygen desaturation.   She is undergoing dialysis on Tuesday, Thursdays and Saturdays.  She believes she does not sleep as well on the days that she does not have dialysis admits to some shortness of breath nocturnally.  She is unaware where of any recurrent episodes of supraventricular tachycardia.  She was hospitalized at [facility]  on 2 occasions in April and again in May 2017.  I reviewed her records from [facility] via care everywhere.  She was found to have a significant  left subcapsular hematoma of her kidney.  She ultimately underwent left kidney nephrectomy.. An echo Doppler study on 05/18/2015 showed normal to low normal LV function.  There was a least moderate perivalvular leak consistent with aortic regurgitation.  A subsequent echo on  06/19/2015 showed improvement with an EF of 5055% and only trace aortic regurgitation.  She has a mechanical aortic valve.  The pressure gradient was 33 mm peek with a mean of 15 mm.  Since I last saw her, she was hospitalized in November 2017 at [facility] with acute liver transplant rejection.  Her bilirubin had increased to 5.7 and she had undergone  liver biopsy in October, concerning for acute transplant rejection versus infection.  She was given multiple doses of Solu-Medrol with improvement of LFTs and bilirubin.  She is followed by the hepatology department at [facility].  She underwent ERCP on November 15 which demonstrated anastomotic stricture which was dilated followed by stent placement.  She was started on Levaquin post ERCP.  She denies any episodes of chest pain.  She continues to undergo dialysis on Monday, Wednesday and Friday and apparently developed an episode of SVT at a heart rate of 175 with dialysis yesterday.  She presents for evaluation.  Past Medical History:  Diagnosis Date  . Anemia    takes Folic Acid daily  . Anxiety   . Aortic stenosis   . Arthritis    "left hand, back" (08/30/2012)  . Asthma   . CAD (coronary artery disease)   . CAD (coronary artery disease) Jan. 2015   Cath: 20% LAD, 50% D1; s/p LIMA-LAD  . Chronic back pain   . Chronic bronchitis ([place])    "q yr w/season changes" (08/30/2012)  . Chronic constipation    takes MIralax and Colace daily  . COPD (chronic obstructive pulmonary disease) ([place])   . Depression    takes Cymbalta for "severe" depression  . End stage renal disease on dialysis [facility]) 02/27/2011   Kidneys shut down at time of liver transplant in Sept 2011 at [facility] in [place], she has been on HD ever since.  Dialyzes at [facility] HD on TTS schedule.  Had L forearm graft used 10 months then removed Dec 2012 due to suspected infection.  A right upper arm AVG was placed Dec 2012 but she developed steal symptoms acutely and it was ligated the same day.  Never had an AV fistula due to small veins.  Now has L thigh AVG put in Jan 2013, has not clotted to date.    [name] GERD (gastroesophageal reflux disease)    takes Omeprazole daily  . Headache    "at least monthly" (08/30/2012)  . Hepatitis C   . History of blood transfusion    "several" (08/30/2012)  . Hypertension    takes Metoprolol and Lisinopril  daily, sees Dr Bea Graff  . Hypothyroidism    takes Synthroid daily  . Migraine    "last migraine was in 2013" (08/30/2012)  . Neuromuscular disorder ([place])    carpal tunnel in right hand  . Obesity   . Peripheral vascular disease (HCC) hands and legs  . Pneumonia    "today and several times before" (08/30/2012)  . 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 SVT (supraventricular tachycardia) ([place]) 06/09/14  . Tobacco abuse   . Type II diabetes mellitus (HCC)    Levemir 2units daily if > 150    Past Surgical History:  Procedure Laterality Date  . AORTIC VALVE REPLACEMENT N/A 03/15/2013   AVR; Surgeon: Ivin Poot, MD;  Location: [place]; Open Heart Surgery;  37m[employer] mechanical prosthesis, top hat valve  . ARTERIOVENOUS GRAFT PLACEMENT Left 10/03/10    forearm  . AV FISTULA PLACEMENT  01/29/2011   Procedure: INSERTION OF ARTERIOVENOUS (AV) GORE-TEX GRAFT ARM;  Surgeon: CElam Dutch MD;  [facility];  Service: Vascular;  Laterality: Right;  . AV FISTULA PLACEMENT  03/10/2011   Procedure: INSERTION OF ARTERIOVENOUS (AV) GORE-TEX GRAFT THIGH;  Surgeon: CElam Dutch MD;  [place]  Service: Vascular;  Laterality: Left;  . ADiamondREMOVAL  12/23/2010   Procedure: REMOVAL OF ARTERIOVENOUS GORETEX GRAFT ([place];  Surgeon: CElam Dutch MD;  [place]  Service: Vascular;  Laterality: Left;  procedure started @1736 -1852  . CHOLECYSTECTOMY  1993  . CORONARY ARTERY BYPASS GRAFT N/A 03/15/2013   Procedure: CORONARY ARTERY BYPASS GRAFTING (CABG) times one using left internal mammary artery.;  Surgeon: PIvin Poot MD;  [place]  Service: Open Heart Surgery;  Laterality: N/A;  POSS CABG X 1  . CYSTOSCOPY  1990's  . INSERTION OF DIALYSIS CATHETER  12/23/2010   Procedure: INSERTION OF DIALYSIS CATHETER;  Surgeon: CElam Dutch MD;  [place]  Service: Vascular;  Laterality: Right;  Right Internal Jugular 28cm dialysis catheter insertion procedure time 1701-1720   . INTRAOPERATIVE TRANSESOPHAGEAL ECHOCARDIOGRAM N/A 03/15/2013   Procedure: INTRAOPERATIVE TRANSESOPHAGEAL ECHOCARDIOGRAM;  Surgeon: PIvin Poot MD;  [place]  Service: Open Heart Surgery;  Laterality: N/A;  . LEFT HEART CATHETERIZATION WITH CORONARY ANGIOGRAM N/A 07/29/2012   Procedure: LEFT HEART CATHETERIZATION WITH CORONARY ANGIOGRAM;  Surgeon: TTroy Sine MD;  [facility];  Service: Cardiovascular;  Laterality: N/A;  . LEFT HEART CATHETERIZATION WITH CORONARY ANGIOGRAM N/A 03/10/2013   Procedure: LEFT HEART CATHETERIZATION WITH CORONARY ANGIOGRAM;  Surgeon: TTroy Sine MD;  [facility];  Service: Cardiovascular;  Laterality: N/A;  . LEFT HEART CATHETERIZATION WITH CORONARY/GRAFT ANGIOGRAM N/A 12/24/2011   Procedure: LEFT HEART CATHETERIZATION WITH CBeatrix Fetters  Surgeon: JLorretta Harp MD;  [facility];  Service: Cardiovascular;  Laterality: N/A;  . LEFT HEART CATHETERIZATION WITH CORONARY/GRAFT ANGIOGRAM N/A 12/16/2013   Procedure: LEFT HEART CATHETERIZATION WITH CBeatrix Fetters  Surgeon: TTroy Sine MD;  [facility];  Service: Cardiovascular;  Laterality: N/A;  . LIVER TRANSPLANT  10/25/2009   sees Dr TFerol Luz1 every 6 months, saw last in Dec 2013. DDelynn FlavinCoord [phone] . PERIPHERAL VASCULAR CATHETERIZATION N/A 11/06/2015   Procedure: Abdominal Aortogram;  Surgeon: VSerafina Mitchell MD;  [place];  Service: Cardiovascular;  Laterality: N/A;  . PERIPHERAL VASCULAR CATHETERIZATION N/A 11/06/2015   Procedure: Lower Extremity Angiography;  Surgeon: VSerafina Mitchell MD;  [place];  Service: Cardiovascular;  Laterality: N/A;  . PERIPHERAL VASCULAR CATHETERIZATION  11/06/2015   Procedure: Peripheral Vascular  Atherectomy;  Surgeon: VSerafina Mitchell MD;  [place];  Service: Cardiovascular;;  Left Superficial femoral  . SHUNTOGRAM Left 05/15/2014   Procedure: SHUNTOGRAM;  Surgeon: BConrad Doctor Phillips MD;  [facility];  Service: Cardiovascular;  Laterality: Left;  . SMALL INTESTINE SURGERY  90's  . SPINAL GROWTH RODS  2010   "put 2 metal rods in my back; they had detetriorated" (08/30/2012)  . THROMBECTOMY    . THROMBECTOMY AND REVISION OF ARTERIOVENTOUS (AV) GORETEX  GRAFT Left 03/30/2014  . THROMBECTOMY AND REVISION OF ARTERIOVENTOUS (AV) GORETEX  GRAFT Left 03/30/2014   Procedure: THROMBECTOMY AND  REVISION OF ARTERIOVENTOUS (AV) GORETEX  GRAFT;  Surgeon: Conrad Heilwood, MD;  Location: [place];  Service: Vascular;  Laterality: Left;  . TUBAL LIGATION  1990's    Allergies  Allergen Reactions  . Acetaminophen Other (See Comments)    Liver transplant recipient   . Codeine Itching  . Mirtazapine Other (See Comments)    hallucination  . Morphine Itching and Other (See Comments)    hallucinate    Current Outpatient Prescriptions  Medication Sig Dispense Refill  . aspirin EC 81 MG tablet Take 81 mg by mouth daily.    . calcium acetate (PHOSLO) 667 MG capsule Take 1 capsule (667 mg total) by mouth 3 (three) times daily with meals. 30 capsule 1  . enoxaparin (LOVENOX) 40 MG/0.4ML injection Inject 0.4 mLs (40 mg total) into the skin daily. 10 Syringe 1  . gabapentin (NEURONTIN) 300 MG capsule Take 300 mg by mouth every evening.    [name] levothyroxine (SYNTHROID, LEVOTHROID) 150 MCG tablet Take 150 mcg by mouth daily before breakfast.    . metoprolol tartrate (LOPRESSOR) 25 MG tablet Take 25 mg by mouth 2 (two) times daily.    . nitroGLYCERIN (NITROSTAT) 0.4 MG SL tablet Place 1 tablet (0.4 mg total) under the tongue every 5 (five) minutes as needed for chest pain. 30 tablet 0  . ondansetron (ZOFRAN ODT) 4 MG disintegrating tablet Take 1 tablet (4 mg total) by mouth every 8 (eight) hours  as needed for nausea. 6 tablet 0  . senna-docusate (SENOKOT-S) 8.6-50 MG tablet Take 1 tablet by mouth daily.    . tacrolimus (PROGRAF) 1 MG capsule Take 2 capsules (2 mg total) by mouth 2 (two) times daily. Take 2 capsules twice daily. 120 capsule 0  . warfarin (COUMADIN) 2 MG tablet Take 1- 2 tablets by mouth daily or as directed by coumadin clinic 45 tablet 3   No current facility-administered medications for this visit.     Social History   Social History  . Marital status: Divorced    Spouse name: N/A  . Number of children: N/A  . Years of education: N/A   Occupational History  . Not on file.   Social History Main Topics  . Smoking status: Current Every Day Smoker    Packs/day: 1.00    Years: 40.00    Types: Cigarettes  . Smokeless tobacco: Never Used     Comment: Almost 1 pk per day  . Alcohol use No  . Drug use: No  . Sexual activity: No   Other Topics Concern  . Not on file   Social History Narrative  . No narrative on file    Family History  Problem Relation Age of Onset  . Cancer Mother   . Diabetes Mother   . Hypertension Mother   . Stroke Mother   . Cancer Father   . Anesthesia problems Neg Hx   . Hypotension Neg Hx   . Malignant hyperthermia Neg Hx   . Pseudochol deficiency Neg Hx     ROS General: Negative; No fevers, chills, or night sweats HEENT: Negative; No changes in vision or hearing, sinus congestion, difficulty swallowing Pulmonary: Negative; No cough, wheezing, shortness of breath, hemoptysis Cardiovascular: See history of present illness GI: Negative; No nausea, vomiting, diarrhea, or abdominal pain GU: Status post biliary stenting with anastomotic stricture and recent acute liver transplant rejection treated with steroids Musculoskeletal: Negative; no myalgias, joint pain, or weakness Hematologic: Negative; no easy bruising, bleeding Endocrine: Positive for hypothyroidism;  no heat/cold intolerance; no diabetes, Neuro: Perform  neuropathy; no changes in balance, headaches Skin: Negative; No rashes or skin lesions Psychiatric: Negative; No behavioral problems, depression Sleep: positive for witnessed apnea No snoring,  daytime sleepiness, hypersomnolence, bruxism, restless legs, hypnogognic hallucinations. Other comprehensive 14 point system review is negative  Physical Exam BP (!) 160/60   Pulse 63   Ht 5' 4.5" (1.638 m)   Wt 131 lb 6.4 oz (59.6 kg)   BMI 22.21 kg/m    Wt Readings from Last 3 Encounters:  01/17/16 131 lb 6.4 oz (59.6 kg)  11/29/15 130 lb 15.3 oz (59.4 kg)  11/20/15 135 lb (61.2 kg)   General: Alert, oriented, no distress.  Skin: normal turgor, no rashes, warm and dry HEENT: Normocephalic, atraumatic. Pupils equal round and reactive to light; sclera anicteric; extraocular muscles intact, No lid lag; Nose without nasal septal hypertrophy; Mouth/Parynx benign; Mallinpatti scale 3 Neck: No JVD, no carotid bruits; normal carotid upstroke Lungs: clear to ausculatation and percussion bilaterally; no wheezing or rales, normal inspiratory and expiratory effort Chest wall: without tenderness to palpitation Heart: PMI not displaced, RRR, s1 s2 normal, 2/6 systolic murmur, crisp mechanical heart sounds; No diastolic murmur, no rubs, gallops, thrills, or heaves Abdomen: soft, nontender; no hepatosplenomehaly, BS+; abdominal aorta nontender and not dilated by palpation. Back: no CVA tenderness Pulses: 2+  Musculoskeletal: full range of motion, normal strength, no joint deformities Extremities: Pulses 2+, no clubbing cyanosis or edema, Homan's sign negative  Neurologic: grossly nonfocal; Cranial nerves grossly wnl Psychologic: Normal mood and affect  ECG (independently read by me): Sinus rhythm with PAC.  LVH by voltage.  Diffuse T-wave abnormality, inferior and laterally.  QTc interval 452 ms.  May 2017 ECG (independently read by me): Normal sinus rhythm.  QRS complex V1 V2.  Nonspecific T-wave  abnormality.  June 2016 ECG (independently read by me): Normal sinus rhythm at 81 bpm.  Probable left atrial enlargement.  QS complex anteriorly.  Increased QTc interval.  Inferolateral T changes.  06/19/2014 ECG (independently read by me): Normal sinus rhythm at 77 bpm.  Nondiagnostic T changes in leads 1 aVL and V6.  March 2016 ECG (independently read by me): Normal sinus rhythm at 69 bpm.  Borderline first degree AV block with a PR interval 204 ms.  Inferolateral T wave abnormality.  November 2015 ECG (independently read by me): Normal sinus rhythm.  Borderline first-degree AV block with a PR interval of 202.  T abnormalities in leads 1 and L2 and V6.  LVH by voltage.  May 2015 ECG (independently read by me): Normal sinus rhythm at 72 beats per minute.  T wave changes in leads 1 and L. and V6.  QTC 486 ms.  LABS:  BMP Latest Ref Rng & Units 01/17/2016 11/29/2015 11/28/2015  Glucose 65 - 99 mg/dL 113(H) 99 95  BUN 7 - 25 mg/dL 27(H) 12 28(H)  Creatinine 0.50 - 0.99 mg/dL 6.42(H) 4.36(H) 6.81(H)  Sodium 135 - 146 mmol/L 136 136 137  Potassium 3.5 - 5.3 mmol/L 4.1 4.4 5.0  Chloride 98 - 110 mmol/L 92(L) 96(L) 97(L)  CO2 20 - 31 mmol/L 26 31 29   Calcium 8.6 - 10.4 mg/dL 8.8 9.2 8.9    Hepatic Function Latest Ref Rng & Units 01/17/2016 11/29/2015 11/28/2015  Total Protein 6.1 - 8.1 g/dL 6.7 6.5 6.6  Albumin 3.6 - 5.1 g/dL 3.1(L) 2.6(L) 2.6(L)  AST 10 - 35 U/L 71(H) 528(H) 362(H)  ALT 6 - 29 U/L 48(H) 240(H) 177(H)  Alk Phosphatase 33 - 130 U/L 420(H) 420(H) 388(H)  Total Bilirubin 0.2 - 1.2 mg/dL 2.9(H) 0.9 0.6  Bilirubin, Direct 0.1 - 0.5 mg/dL - - 0.2    CBC Latest Ref Rng & Units 01/17/2016 11/29/2015 11/28/2015  WBC 3.8 - 10.8 K/uL 7.8 6.8 6.4  Hemoglobin 11.7 - 15.5 g/dL 11.8 9.6(L) 9.8(L)  Hematocrit 35.0 - 45.0 % 36.6 28.9(L) 30.1(L)  Platelets 140 - 400 K/uL 215 236 244   Lab Results  Component Value Date   MCV 97.9 01/17/2016   MCV 91.2 11/29/2015   MCV 90.7  11/28/2015     BNP    Component Value Date/Time   PROBNP 33,098.0 (H) 01/02/2014 2318    Lipid Panel     Component Value Date/Time   CHOL 225 (H) 12/22/2011 1030   TRIG 121 12/22/2011 1030   HDL 97 12/22/2011 1030   CHOLHDL 2.3 12/22/2011 1030   VLDL 24 12/22/2011 1030   LDLCALC 104 (H) 12/22/2011 1030     RADIOLOGY: Dg Chest 2 View  06/01/2013   CLINICAL DATA:  History of aortic valve replacement in February, follow-up  EXAM: CHEST  2 VIEW  COMPARISON:  Chest x-ray 04/20/2013, and CT chest of 03/11/2013  FINDINGS: Cardiomegaly is stable. There is very mild pulmonary vascular congestion present. No effusion is seen. A nodular opacity in the medial left lung apex is stable and compared to the prior CT chest represents a bony density from the upper thoracic spine. Median sternotomy sutures remain and an aortic valve replacement is present. No acute bony abnormality is seen. Multiple surgical clips are present within the epigastrium.  IMPRESSION: 1. Stable cardiomegaly. 2. Mild pulmonary vascular congestion.   Electronically Signed   By: Ivar Drape M.D.   On: 06/01/2013 09:40    I personally reviewed the hospital records from her 2 hospitalizations at [facility] in April and again in May 2017, TEE and transthoracic echo studies, and recent [facility] discharge summary on 12/27/2015.  ASSESSMENT AND PLAN: Ms. Mikiya Nebergall is a 60 year old African-American female who underwent mechanical aortic valve replacement for moderately severe symptomatic aortic stenosis and single-vessel LIMA to LAD CABG revascularization surgery in February 2015.  She had been readmitted with episodes of tachycardia and shortness of breath.   She had developed recurrent episode of SVT which responded to adenosine.  She was hospitalized at [facility] and due to significant hematoma in her left kidney and underwent 11th rib and left kidney removal.  During the initial hospitalization, she was felt to have a  possible moderate perivalvular leak around her prosthetic aortic valve.  On subsequent TEE testing there was trace aortic regurgitation.  Her mean pressure gradient was 15 mm with a peak gradient of 33 concordant with her mechanical prosthesis.  She had developed significant LFT elevation, as well as bilirubin, and had undergone liver biopsy.  She ultimately was hospitalized at [facility] with presumed acute liver transplant rejection for which she was treated with Solu-Medrol with improvement.  Ultimately was found to have a biliary stricture which was dilated and underwent stenting.  She has been without chest pain.  However, she developed a recurrent episode of SVT yesterday during dialysis.  Her ECG today shows sinus rhythm, PAC, LVH and diffuse T-wave abnormalities.  I have suggested the addition of metoprolol 25 mg twice a day to her medical regimen.  Her blood pressure today is stable.  Repeat laboratory consisting of a cmet, magnesium, TSH, and CBC will be obtained today.  I will see her in 2-3 months for reevaluation.  Time spent: 25 minutes  Troy Sine, MD, [facility]  01/23/2016 6:18 PM

## 2016-01-24 ENCOUNTER — Telehealth: Payer: Self-pay | Admitting: Cardiovascular Disease

## 2016-01-24 DIAGNOSIS — E11622 Type 2 diabetes mellitus with other skin ulcer: Secondary | ICD-10-CM | POA: Diagnosis not present

## 2016-01-24 DIAGNOSIS — J449 Chronic obstructive pulmonary disease, unspecified: Secondary | ICD-10-CM | POA: Diagnosis not present

## 2016-01-24 DIAGNOSIS — I739 Peripheral vascular disease, unspecified: Secondary | ICD-10-CM | POA: Diagnosis not present

## 2016-01-24 DIAGNOSIS — I10 Essential (primary) hypertension: Secondary | ICD-10-CM | POA: Diagnosis not present

## 2016-01-24 NOTE — Telephone Encounter (Signed)
Pt wants a second opinion her with one of our doctor who see pts for PAD. She is already a patient at [facility],they told her it was nothing else they could do.They said theyhad cleared the blockage.They sent her to a foot doctor,the foot doctor told her to get a second opinion,because it was vascular.He also said if she did not getting this taken care of,she was at a high risk for losing her foot.

## 2016-01-25 ENCOUNTER — Ambulatory Visit (INDEPENDENT_AMBULATORY_CARE_PROVIDER_SITE_OTHER): Payer: Medicare Other | Admitting: Pharmacist

## 2016-01-25 DIAGNOSIS — E118 Type 2 diabetes mellitus with unspecified complications: Secondary | ICD-10-CM | POA: Diagnosis not present

## 2016-01-25 DIAGNOSIS — E876 Hypokalemia: Secondary | ICD-10-CM | POA: Diagnosis not present

## 2016-01-25 DIAGNOSIS — N2581 Secondary hyperparathyroidism of renal origin: Secondary | ICD-10-CM | POA: Diagnosis not present

## 2016-01-25 DIAGNOSIS — Z952 Presence of prosthetic heart valve: Secondary | ICD-10-CM

## 2016-01-25 DIAGNOSIS — Z7901 Long term (current) use of anticoagulants: Secondary | ICD-10-CM

## 2016-01-25 DIAGNOSIS — N186 End stage renal disease: Secondary | ICD-10-CM | POA: Diagnosis not present

## 2016-01-28 DIAGNOSIS — E118 Type 2 diabetes mellitus with unspecified complications: Secondary | ICD-10-CM | POA: Diagnosis not present

## 2016-01-28 DIAGNOSIS — N2581 Secondary hyperparathyroidism of renal origin: Secondary | ICD-10-CM | POA: Diagnosis not present

## 2016-01-28 DIAGNOSIS — E876 Hypokalemia: Secondary | ICD-10-CM | POA: Diagnosis not present

## 2016-01-28 DIAGNOSIS — N186 End stage renal disease: Secondary | ICD-10-CM | POA: Diagnosis not present

## 2016-01-29 DIAGNOSIS — Z5181 Encounter for therapeutic drug level monitoring: Secondary | ICD-10-CM | POA: Diagnosis not present

## 2016-01-29 DIAGNOSIS — Z944 Liver transplant status: Secondary | ICD-10-CM | POA: Diagnosis not present

## 2016-01-30 DIAGNOSIS — E876 Hypokalemia: Secondary | ICD-10-CM | POA: Diagnosis not present

## 2016-01-30 DIAGNOSIS — L97929 Non-pressure chronic ulcer of unspecified part of left lower leg with unspecified severity: Secondary | ICD-10-CM | POA: Diagnosis not present

## 2016-01-30 DIAGNOSIS — N2581 Secondary hyperparathyroidism of renal origin: Secondary | ICD-10-CM | POA: Diagnosis not present

## 2016-01-30 DIAGNOSIS — I70201 Unspecified atherosclerosis of native arteries of extremities, right leg: Secondary | ICD-10-CM | POA: Diagnosis not present

## 2016-01-30 DIAGNOSIS — E118 Type 2 diabetes mellitus with unspecified complications: Secondary | ICD-10-CM | POA: Diagnosis not present

## 2016-01-30 DIAGNOSIS — I779 Disorder of arteries and arterioles, unspecified: Secondary | ICD-10-CM | POA: Diagnosis not present

## 2016-01-30 DIAGNOSIS — N186 End stage renal disease: Secondary | ICD-10-CM | POA: Diagnosis not present

## 2016-01-30 DIAGNOSIS — I359 Nonrheumatic aortic valve disorder, unspecified: Secondary | ICD-10-CM | POA: Diagnosis not present

## 2016-01-30 DIAGNOSIS — I739 Peripheral vascular disease, unspecified: Secondary | ICD-10-CM | POA: Diagnosis not present

## 2016-01-30 LAB — PROTIME-INR: INR: 2.4 — AB (ref 0.9–1.1)

## 2016-02-01 ENCOUNTER — Ambulatory Visit (INDEPENDENT_AMBULATORY_CARE_PROVIDER_SITE_OTHER): Payer: Medicare Other | Admitting: Pharmacist Clinician (PhC)/ Clinical Pharmacy Specialist

## 2016-02-01 DIAGNOSIS — Z952 Presence of prosthetic heart valve: Secondary | ICD-10-CM

## 2016-02-01 DIAGNOSIS — N186 End stage renal disease: Secondary | ICD-10-CM | POA: Diagnosis not present

## 2016-02-01 DIAGNOSIS — E876 Hypokalemia: Secondary | ICD-10-CM | POA: Diagnosis not present

## 2016-02-01 DIAGNOSIS — Z7901 Long term (current) use of anticoagulants: Secondary | ICD-10-CM

## 2016-02-01 DIAGNOSIS — N2581 Secondary hyperparathyroidism of renal origin: Secondary | ICD-10-CM | POA: Diagnosis not present

## 2016-02-01 DIAGNOSIS — E118 Type 2 diabetes mellitus with unspecified complications: Secondary | ICD-10-CM | POA: Diagnosis not present

## 2016-02-03 DIAGNOSIS — E876 Hypokalemia: Secondary | ICD-10-CM | POA: Diagnosis not present

## 2016-02-03 DIAGNOSIS — N2581 Secondary hyperparathyroidism of renal origin: Secondary | ICD-10-CM | POA: Diagnosis not present

## 2016-02-03 DIAGNOSIS — E118 Type 2 diabetes mellitus with unspecified complications: Secondary | ICD-10-CM | POA: Diagnosis not present

## 2016-02-03 DIAGNOSIS — N186 End stage renal disease: Secondary | ICD-10-CM | POA: Diagnosis not present

## 2016-02-06 DIAGNOSIS — N186 End stage renal disease: Secondary | ICD-10-CM | POA: Diagnosis not present

## 2016-02-06 DIAGNOSIS — N2581 Secondary hyperparathyroidism of renal origin: Secondary | ICD-10-CM | POA: Diagnosis not present

## 2016-02-06 DIAGNOSIS — E118 Type 2 diabetes mellitus with unspecified complications: Secondary | ICD-10-CM | POA: Diagnosis not present

## 2016-02-06 DIAGNOSIS — E876 Hypokalemia: Secondary | ICD-10-CM | POA: Diagnosis not present

## 2016-02-07 DIAGNOSIS — I10 Essential (primary) hypertension: Secondary | ICD-10-CM | POA: Diagnosis not present

## 2016-02-07 DIAGNOSIS — I739 Peripheral vascular disease, unspecified: Secondary | ICD-10-CM | POA: Diagnosis not present

## 2016-02-07 DIAGNOSIS — M109 Gout, unspecified: Secondary | ICD-10-CM | POA: Diagnosis not present

## 2016-02-07 DIAGNOSIS — L8990 Pressure ulcer of unspecified site, unspecified stage: Secondary | ICD-10-CM | POA: Diagnosis not present

## 2016-02-08 DIAGNOSIS — N186 End stage renal disease: Secondary | ICD-10-CM | POA: Diagnosis not present

## 2016-02-08 DIAGNOSIS — N2581 Secondary hyperparathyroidism of renal origin: Secondary | ICD-10-CM | POA: Diagnosis not present

## 2016-02-08 DIAGNOSIS — E118 Type 2 diabetes mellitus with unspecified complications: Secondary | ICD-10-CM | POA: Diagnosis not present

## 2016-02-08 DIAGNOSIS — E876 Hypokalemia: Secondary | ICD-10-CM | POA: Diagnosis not present

## 2016-02-10 DIAGNOSIS — E876 Hypokalemia: Secondary | ICD-10-CM | POA: Diagnosis not present

## 2016-02-10 DIAGNOSIS — T8640 Unspecified complication of liver transplant: Secondary | ICD-10-CM | POA: Diagnosis not present

## 2016-02-10 DIAGNOSIS — N186 End stage renal disease: Secondary | ICD-10-CM | POA: Diagnosis not present

## 2016-02-10 DIAGNOSIS — E118 Type 2 diabetes mellitus with unspecified complications: Secondary | ICD-10-CM | POA: Diagnosis not present

## 2016-02-10 DIAGNOSIS — Z992 Dependence on renal dialysis: Secondary | ICD-10-CM | POA: Diagnosis not present

## 2016-02-10 DIAGNOSIS — N2581 Secondary hyperparathyroidism of renal origin: Secondary | ICD-10-CM | POA: Diagnosis not present

## 2016-02-13 DIAGNOSIS — E876 Hypokalemia: Secondary | ICD-10-CM | POA: Diagnosis not present

## 2016-02-13 DIAGNOSIS — N186 End stage renal disease: Secondary | ICD-10-CM | POA: Diagnosis not present

## 2016-02-13 DIAGNOSIS — I359 Nonrheumatic aortic valve disorder, unspecified: Secondary | ICD-10-CM | POA: Diagnosis not present

## 2016-02-13 DIAGNOSIS — N2581 Secondary hyperparathyroidism of renal origin: Secondary | ICD-10-CM | POA: Diagnosis not present

## 2016-02-13 LAB — PROTIME-INR: INR: 1.5 — AB (ref <=1.1)

## 2016-02-14 LAB — PROTIME-INR

## 2016-02-15 ENCOUNTER — Ambulatory Visit (INDEPENDENT_AMBULATORY_CARE_PROVIDER_SITE_OTHER): Payer: Medicare Other | Admitting: Pharmacist

## 2016-02-15 ENCOUNTER — Telehealth: Payer: Self-pay | Admitting: *Deleted

## 2016-02-15 DIAGNOSIS — N186 End stage renal disease: Secondary | ICD-10-CM | POA: Diagnosis not present

## 2016-02-15 DIAGNOSIS — E876 Hypokalemia: Secondary | ICD-10-CM | POA: Diagnosis not present

## 2016-02-15 DIAGNOSIS — Z952 Presence of prosthetic heart valve: Secondary | ICD-10-CM

## 2016-02-15 DIAGNOSIS — N2581 Secondary hyperparathyroidism of renal origin: Secondary | ICD-10-CM | POA: Diagnosis not present

## 2016-02-15 DIAGNOSIS — Z7901 Long term (current) use of anticoagulants: Secondary | ICD-10-CM

## 2016-02-15 MED ORDER — LEVOTHYROXINE SODIUM 175 MCG PO TABS: 175.0000 ug | ORAL_TABLET | Freq: Every day | ORAL | 6 refills | Status: DC

## 2016-02-15 NOTE — Telephone Encounter (Signed)
-----  Message from Troy Sine, MD sent at 02/13/2016  7:06 PM EST ----- Pt on dialysis;  LFTs better but mildly elevated; alk phos elevated; no change; TSH elevated; decrease levothyroxine from 150 to 100 ug and re check in 4 weeks

## 2016-02-15 NOTE — Telephone Encounter (Signed)
Informed patient of lab results and recommendations. After consulting with Dr [name] on his medication orders it was determined that he meant to increase the dose to 0.175 mcg instead of decreasing the dose. The increased dose of 0.175 mg was sent to patient's [facility].

## 2016-02-18 DIAGNOSIS — N2581 Secondary hyperparathyroidism of renal origin: Secondary | ICD-10-CM | POA: Diagnosis not present

## 2016-02-18 DIAGNOSIS — E876 Hypokalemia: Secondary | ICD-10-CM | POA: Diagnosis not present

## 2016-02-18 DIAGNOSIS — N186 End stage renal disease: Secondary | ICD-10-CM | POA: Diagnosis not present

## 2016-02-20 ENCOUNTER — Ambulatory Visit: Payer: Medicare Other | Admitting: Cardiovascular Disease

## 2016-02-20 DIAGNOSIS — N2581 Secondary hyperparathyroidism of renal origin: Secondary | ICD-10-CM | POA: Diagnosis not present

## 2016-02-20 DIAGNOSIS — N186 End stage renal disease: Secondary | ICD-10-CM | POA: Diagnosis not present

## 2016-02-20 DIAGNOSIS — E876 Hypokalemia: Secondary | ICD-10-CM | POA: Diagnosis not present

## 2016-02-22 DIAGNOSIS — E876 Hypokalemia: Secondary | ICD-10-CM | POA: Diagnosis not present

## 2016-02-22 DIAGNOSIS — N186 End stage renal disease: Secondary | ICD-10-CM | POA: Diagnosis not present

## 2016-02-22 DIAGNOSIS — N2581 Secondary hyperparathyroidism of renal origin: Secondary | ICD-10-CM | POA: Diagnosis not present

## 2016-02-25 DIAGNOSIS — I359 Nonrheumatic aortic valve disorder, unspecified: Secondary | ICD-10-CM | POA: Diagnosis not present

## 2016-02-25 DIAGNOSIS — N186 End stage renal disease: Secondary | ICD-10-CM | POA: Diagnosis not present

## 2016-02-25 DIAGNOSIS — E876 Hypokalemia: Secondary | ICD-10-CM | POA: Diagnosis not present

## 2016-02-25 DIAGNOSIS — N2581 Secondary hyperparathyroidism of renal origin: Secondary | ICD-10-CM | POA: Diagnosis not present

## 2016-02-25 LAB — PROTIME-INR: INR: 2.7 — AB (ref <=1.1)

## 2016-02-26 LAB — PROTIME-INR

## 2016-02-27 DIAGNOSIS — E876 Hypokalemia: Secondary | ICD-10-CM | POA: Diagnosis not present

## 2016-02-27 DIAGNOSIS — N186 End stage renal disease: Secondary | ICD-10-CM | POA: Diagnosis not present

## 2016-02-27 DIAGNOSIS — N2581 Secondary hyperparathyroidism of renal origin: Secondary | ICD-10-CM | POA: Diagnosis not present

## 2016-02-29 ENCOUNTER — Ambulatory Visit (INDEPENDENT_AMBULATORY_CARE_PROVIDER_SITE_OTHER): Payer: Medicare Other | Admitting: Pharmacist

## 2016-02-29 DIAGNOSIS — N2581 Secondary hyperparathyroidism of renal origin: Secondary | ICD-10-CM | POA: Diagnosis not present

## 2016-02-29 DIAGNOSIS — N186 End stage renal disease: Secondary | ICD-10-CM | POA: Diagnosis not present

## 2016-02-29 DIAGNOSIS — E876 Hypokalemia: Secondary | ICD-10-CM | POA: Diagnosis not present

## 2016-02-29 DIAGNOSIS — Z7901 Long term (current) use of anticoagulants: Secondary | ICD-10-CM

## 2016-02-29 DIAGNOSIS — Z952 Presence of prosthetic heart valve: Secondary | ICD-10-CM

## 2016-03-03 DIAGNOSIS — N186 End stage renal disease: Secondary | ICD-10-CM | POA: Diagnosis not present

## 2016-03-03 DIAGNOSIS — E876 Hypokalemia: Secondary | ICD-10-CM | POA: Diagnosis not present

## 2016-03-03 DIAGNOSIS — N2581 Secondary hyperparathyroidism of renal origin: Secondary | ICD-10-CM | POA: Diagnosis not present

## 2016-03-04 ENCOUNTER — Encounter: Payer: Self-pay | Admitting: Vascular Surgery

## 2016-03-05 ENCOUNTER — Emergency Department (HOSPITAL_BASED_OUTPATIENT_CLINIC_OR_DEPARTMENT_OTHER): Payer: Medicare Other

## 2016-03-05 ENCOUNTER — Other Ambulatory Visit: Payer: Self-pay

## 2016-03-05 ENCOUNTER — Emergency Department (HOSPITAL_BASED_OUTPATIENT_CLINIC_OR_DEPARTMENT_OTHER): Admit: 2016-03-05 | Discharge: 2016-03-05 | Disposition: A | Discharge status: Home / Self Care | Payer: Medicare Other

## 2016-03-05 ENCOUNTER — Other Ambulatory Visit (HOSPITAL_COMMUNITY): Payer: Self-pay

## 2016-03-05 ENCOUNTER — Inpatient Hospital Stay (HOSPITAL_COMMUNITY)
Admission: EM | Admit: 2016-03-05 | Discharge: 2016-03-20 | DRG: 853 | Disposition: A | Discharge status: Home / Self Care | Payer: Medicare Other | Attending: Internal Medicine | Admitting: Internal Medicine

## 2016-03-05 ENCOUNTER — Encounter (HOSPITAL_COMMUNITY): Payer: Self-pay | Admitting: Emergency Medicine

## 2016-03-05 ENCOUNTER — Emergency Department (HOSPITAL_COMMUNITY): Payer: Medicare Other

## 2016-03-05 DIAGNOSIS — I471 Supraventricular tachycardia, unspecified: Secondary | ICD-10-CM | POA: Diagnosis present

## 2016-03-05 DIAGNOSIS — E875 Hyperkalemia: Secondary | ICD-10-CM | POA: Diagnosis present

## 2016-03-05 DIAGNOSIS — J410 Simple chronic bronchitis: Secondary | ICD-10-CM

## 2016-03-05 DIAGNOSIS — N2581 Secondary hyperparathyroidism of renal origin: Secondary | ICD-10-CM | POA: Diagnosis not present

## 2016-03-05 DIAGNOSIS — E118 Type 2 diabetes mellitus with unspecified complications: Secondary | ICD-10-CM

## 2016-03-05 DIAGNOSIS — J9600 Acute respiratory failure, unspecified whether with hypoxia or hypercapnia: Secondary | ICD-10-CM

## 2016-03-05 DIAGNOSIS — Z952 Presence of prosthetic heart valve: Secondary | ICD-10-CM

## 2016-03-05 DIAGNOSIS — Z7901 Long term (current) use of anticoagulants: Secondary | ICD-10-CM

## 2016-03-05 DIAGNOSIS — G4730 Sleep apnea, unspecified: Secondary | ICD-10-CM | POA: Diagnosis present

## 2016-03-05 DIAGNOSIS — M79662 Pain in left lower leg: Secondary | ICD-10-CM | POA: Diagnosis present

## 2016-03-05 DIAGNOSIS — E039 Hypothyroidism, unspecified: Secondary | ICD-10-CM | POA: Diagnosis present

## 2016-03-05 DIAGNOSIS — R079 Chest pain, unspecified: Secondary | ICD-10-CM | POA: Diagnosis not present

## 2016-03-05 DIAGNOSIS — M79605 Pain in left leg: Secondary | ICD-10-CM | POA: Diagnosis not present

## 2016-03-05 DIAGNOSIS — J449 Chronic obstructive pulmonary disease, unspecified: Secondary | ICD-10-CM | POA: Diagnosis present

## 2016-03-05 DIAGNOSIS — R0902 Hypoxemia: Secondary | ICD-10-CM

## 2016-03-05 DIAGNOSIS — I2510 Atherosclerotic heart disease of native coronary artery without angina pectoris: Secondary | ICD-10-CM | POA: Diagnosis present

## 2016-03-05 DIAGNOSIS — J9601 Acute respiratory failure with hypoxia: Secondary | ICD-10-CM | POA: Diagnosis present

## 2016-03-05 DIAGNOSIS — M79604 Pain in right leg: Secondary | ICD-10-CM | POA: Diagnosis present

## 2016-03-05 DIAGNOSIS — A419 Sepsis, unspecified organism: Principal | ICD-10-CM | POA: Diagnosis present

## 2016-03-05 DIAGNOSIS — L89622 Pressure ulcer of left heel, stage 2: Secondary | ICD-10-CM | POA: Diagnosis present

## 2016-03-05 DIAGNOSIS — R778 Other specified abnormalities of plasma proteins: Secondary | ICD-10-CM | POA: Diagnosis present

## 2016-03-05 DIAGNOSIS — J189 Pneumonia, unspecified organism: Secondary | ICD-10-CM | POA: Diagnosis present

## 2016-03-05 DIAGNOSIS — E1140 Type 2 diabetes mellitus with diabetic neuropathy, unspecified: Secondary | ICD-10-CM | POA: Diagnosis present

## 2016-03-05 DIAGNOSIS — Z886 Allergy status to analgesic agent status: Secondary | ICD-10-CM

## 2016-03-05 DIAGNOSIS — Z7982 Long term (current) use of aspirin: Secondary | ICD-10-CM

## 2016-03-05 DIAGNOSIS — Z833 Family history of diabetes mellitus: Secondary | ICD-10-CM

## 2016-03-05 DIAGNOSIS — E1165 Type 2 diabetes mellitus with hyperglycemia: Secondary | ICD-10-CM | POA: Diagnosis present

## 2016-03-05 DIAGNOSIS — Z944 Liver transplant status: Secondary | ICD-10-CM

## 2016-03-05 DIAGNOSIS — I739 Peripheral vascular disease, unspecified: Secondary | ICD-10-CM

## 2016-03-05 DIAGNOSIS — I959 Hypotension, unspecified: Secondary | ICD-10-CM | POA: Diagnosis present

## 2016-03-05 DIAGNOSIS — R6521 Severe sepsis with septic shock: Secondary | ICD-10-CM | POA: Diagnosis not present

## 2016-03-05 DIAGNOSIS — I209 Angina pectoris, unspecified: Secondary | ICD-10-CM | POA: Diagnosis not present

## 2016-03-05 DIAGNOSIS — Z888 Allergy status to other drugs, medicaments and biological substances status: Secondary | ICD-10-CM

## 2016-03-05 DIAGNOSIS — Z515 Encounter for palliative care: Secondary | ICD-10-CM

## 2016-03-05 DIAGNOSIS — Y95 Nosocomial condition: Secondary | ICD-10-CM | POA: Diagnosis present

## 2016-03-05 DIAGNOSIS — E43 Unspecified severe protein-calorie malnutrition: Secondary | ICD-10-CM | POA: Diagnosis present

## 2016-03-05 DIAGNOSIS — F17210 Nicotine dependence, cigarettes, uncomplicated: Secondary | ICD-10-CM | POA: Diagnosis present

## 2016-03-05 DIAGNOSIS — I119 Hypertensive heart disease without heart failure: Secondary | ICD-10-CM | POA: Diagnosis present

## 2016-03-05 DIAGNOSIS — E11649 Type 2 diabetes mellitus with hypoglycemia without coma: Secondary | ICD-10-CM | POA: Diagnosis present

## 2016-03-05 DIAGNOSIS — Z86718 Personal history of other venous thrombosis and embolism: Secondary | ICD-10-CM

## 2016-03-05 DIAGNOSIS — L8992 Pressure ulcer of unspecified site, stage 2: Secondary | ICD-10-CM | POA: Clinically undetermined

## 2016-03-05 DIAGNOSIS — E1129 Type 2 diabetes mellitus with other diabetic kidney complication: Secondary | ICD-10-CM | POA: Diagnosis not present

## 2016-03-05 DIAGNOSIS — K219 Gastro-esophageal reflux disease without esophagitis: Secondary | ICD-10-CM | POA: Diagnosis present

## 2016-03-05 DIAGNOSIS — D638 Anemia in other chronic diseases classified elsewhere: Secondary | ICD-10-CM | POA: Diagnosis present

## 2016-03-05 DIAGNOSIS — I214 Non-ST elevation (NSTEMI) myocardial infarction: Secondary | ICD-10-CM | POA: Diagnosis not present

## 2016-03-05 DIAGNOSIS — R071 Chest pain on breathing: Secondary | ICD-10-CM | POA: Diagnosis not present

## 2016-03-05 DIAGNOSIS — M79672 Pain in left foot: Secondary | ICD-10-CM | POA: Diagnosis not present

## 2016-03-05 DIAGNOSIS — Z992 Dependence on renal dialysis: Secondary | ICD-10-CM

## 2016-03-05 DIAGNOSIS — Z8249 Family history of ischemic heart disease and other diseases of the circulatory system: Secondary | ICD-10-CM

## 2016-03-05 DIAGNOSIS — Z79899 Other long term (current) drug therapy: Secondary | ICD-10-CM

## 2016-03-05 DIAGNOSIS — B182 Chronic viral hepatitis C: Secondary | ICD-10-CM | POA: Diagnosis present

## 2016-03-05 DIAGNOSIS — D631 Anemia in chronic kidney disease: Secondary | ICD-10-CM | POA: Diagnosis not present

## 2016-03-05 DIAGNOSIS — N186 End stage renal disease: Secondary | ICD-10-CM

## 2016-03-05 DIAGNOSIS — I4892 Unspecified atrial flutter: Secondary | ICD-10-CM | POA: Diagnosis present

## 2016-03-05 DIAGNOSIS — R509 Fever, unspecified: Secondary | ICD-10-CM

## 2016-03-05 DIAGNOSIS — Z7189 Other specified counseling: Secondary | ICD-10-CM

## 2016-03-05 DIAGNOSIS — Z66 Do not resuscitate: Secondary | ICD-10-CM | POA: Diagnosis present

## 2016-03-05 DIAGNOSIS — Z794 Long term (current) use of insulin: Secondary | ICD-10-CM

## 2016-03-05 DIAGNOSIS — M79606 Pain in leg, unspecified: Secondary | ICD-10-CM | POA: Diagnosis present

## 2016-03-05 DIAGNOSIS — M79609 Pain in unspecified limb: Secondary | ICD-10-CM | POA: Diagnosis not present

## 2016-03-05 DIAGNOSIS — M898X9 Other specified disorders of bone, unspecified site: Secondary | ICD-10-CM | POA: Diagnosis present

## 2016-03-05 DIAGNOSIS — Z9851 Tubal ligation status: Secondary | ICD-10-CM

## 2016-03-05 DIAGNOSIS — E213 Hyperparathyroidism, unspecified: Secondary | ICD-10-CM | POA: Diagnosis present

## 2016-03-05 DIAGNOSIS — I5032 Chronic diastolic (congestive) heart failure: Secondary | ICD-10-CM | POA: Diagnosis not present

## 2016-03-05 DIAGNOSIS — Z951 Presence of aortocoronary bypass graft: Secondary | ICD-10-CM

## 2016-03-05 DIAGNOSIS — Z885 Allergy status to narcotic agent status: Secondary | ICD-10-CM

## 2016-03-05 DIAGNOSIS — B9561 Methicillin susceptible Staphylococcus aureus infection as the cause of diseases classified elsewhere: Secondary | ICD-10-CM | POA: Diagnosis present

## 2016-03-05 DIAGNOSIS — J90 Pleural effusion, not elsewhere classified: Secondary | ICD-10-CM | POA: Diagnosis not present

## 2016-03-05 DIAGNOSIS — I248 Other forms of acute ischemic heart disease: Secondary | ICD-10-CM | POA: Diagnosis present

## 2016-03-05 DIAGNOSIS — I120 Hypertensive chronic kidney disease with stage 5 chronic kidney disease or end stage renal disease: Secondary | ICD-10-CM | POA: Diagnosis not present

## 2016-03-05 DIAGNOSIS — Z419 Encounter for procedure for purposes other than remedying health state, unspecified: Secondary | ICD-10-CM

## 2016-03-05 DIAGNOSIS — I132 Hypertensive heart and chronic kidney disease with heart failure and with stage 5 chronic kidney disease, or end stage renal disease: Secondary | ICD-10-CM | POA: Diagnosis present

## 2016-03-05 DIAGNOSIS — E1151 Type 2 diabetes mellitus with diabetic peripheral angiopathy without gangrene: Secondary | ICD-10-CM | POA: Diagnosis present

## 2016-03-05 DIAGNOSIS — Z95828 Presence of other vascular implants and grafts: Secondary | ICD-10-CM

## 2016-03-05 DIAGNOSIS — R7989 Other specified abnormal findings of blood chemistry: Secondary | ICD-10-CM | POA: Diagnosis not present

## 2016-03-05 DIAGNOSIS — E1122 Type 2 diabetes mellitus with diabetic chronic kidney disease: Secondary | ICD-10-CM | POA: Diagnosis present

## 2016-03-05 DIAGNOSIS — J440 Chronic obstructive pulmonary disease with acute lower respiratory infection: Secondary | ICD-10-CM | POA: Diagnosis present

## 2016-03-05 DIAGNOSIS — K5900 Constipation, unspecified: Secondary | ICD-10-CM | POA: Diagnosis not present

## 2016-03-05 DIAGNOSIS — I259 Chronic ischemic heart disease, unspecified: Secondary | ICD-10-CM

## 2016-03-05 DIAGNOSIS — G8929 Other chronic pain: Secondary | ICD-10-CM | POA: Diagnosis present

## 2016-03-05 DIAGNOSIS — M869 Osteomyelitis, unspecified: Secondary | ICD-10-CM

## 2016-03-05 DIAGNOSIS — Z6822 Body mass index (BMI) 22.0-22.9, adult: Secondary | ICD-10-CM

## 2016-03-05 DIAGNOSIS — R000 Tachycardia, unspecified: Secondary | ICD-10-CM | POA: Diagnosis not present

## 2016-03-05 DIAGNOSIS — E11621 Type 2 diabetes mellitus with foot ulcer: Secondary | ICD-10-CM | POA: Diagnosis present

## 2016-03-05 LAB — I-STAT TROPONIN, ED: Troponin i, poc: 0.65 ng/mL (ref 0.00–0.08)

## 2016-03-05 LAB — VAS US LOWER EXTREMITY ARTERIAL DUPLEX
LEFT PERO DIST SYS: 88 cm/s
LPOPDPSV: 49 cm/s
LSFDPSV: -98 cm/s
LSFMPSV: -162 cm/s
LSFPPSV: -79 cm/s
Left ant tibial distal sys: 51 cm/s
Left ant tibial mid sys: 48 cm/s
Left peroneal sys PSV: 72 cm/s
Left post tibial sys PSV: 57 cm/s
left post tibial dist sys: 43 cm/s

## 2016-03-05 LAB — MRSA PCR SCREENING: MRSA BY PCR: NEGATIVE

## 2016-03-05 LAB — CBC
HEMATOCRIT: 30.1 % — AB (ref 36.0–46.0)
HEMOGLOBIN: 9.8 g/dL — AB (ref 12.0–15.0)
MCH: 31.6 pg (ref 26.0–34.0)
MCHC: 32.6 g/dL (ref 30.0–36.0)
MCV: 97.1 fL (ref 78.0–100.0)
Platelets: 158 10*3/uL (ref 150–400)
RBC: 3.1 MIL/uL — ABNORMAL LOW (ref 3.87–5.11)
RDW: 16.4 % — AB (ref 11.5–15.5)
WBC: 17.2 10*3/uL — ABNORMAL HIGH (ref 4.0–10.5)

## 2016-03-05 LAB — BASIC METABOLIC PANEL
ANION GAP: 15 (ref 5–15)
BUN: 43 mg/dL — AB (ref 6–20)
CALCIUM: 8.3 mg/dL — AB (ref 8.9–10.3)
CO2: 25 mmol/L (ref 22–32)
Chloride: 94 mmol/L — ABNORMAL LOW (ref 101–111)
Creatinine, Ser: 8.11 mg/dL — ABNORMAL HIGH (ref 0.44–1.00)
GFR calc Af Amer: 5 mL/min — ABNORMAL LOW (ref >60)
GFR calc non Af Amer: 5 mL/min — ABNORMAL LOW (ref >60)
GLUCOSE: 167 mg/dL — AB (ref 65–99)
POTASSIUM: 5.2 mmol/L — AB (ref 3.5–5.1)
Sodium: 134 mmol/L — ABNORMAL LOW (ref 135–145)

## 2016-03-05 LAB — GLUCOSE, CAPILLARY
Glucose-Capillary: 132 mg/dL — ABNORMAL HIGH (ref 65–99)
Glucose-Capillary: 168 mg/dL — ABNORMAL HIGH (ref 65–99)

## 2016-03-05 LAB — PROTIME-INR
INR: 1.81
Prothrombin Time: 21.2 seconds — ABNORMAL HIGH (ref 11.4–15.2)

## 2016-03-05 LAB — TROPONIN I: TROPONIN I: 1.38 ng/mL — AB (ref <0.03)

## 2016-03-05 MED ORDER — ONDANSETRON HCL 4 MG/2ML IJ SOLN
4.0000 mg | Freq: Four times a day (QID) | INTRAMUSCULAR | Status: DC | PRN
  Administered 2016-03-05 – 2016-03-08 (×3): 4 mg via INTRAVENOUS
  Filled 2016-03-05 (×3): qty 2

## 2016-03-05 MED ORDER — HYDROMORPHONE HCL 2 MG/ML IJ SOLN
1.0000 mg | Freq: Once | INTRAMUSCULAR | Status: AC
  Administered 2016-03-05: 1 mg via INTRAVENOUS
  Filled 2016-03-05: qty 1

## 2016-03-05 MED ORDER — METOPROLOL TARTRATE 5 MG/5ML IV SOLN
5.0000 mg | Freq: Once | INTRAVENOUS | Status: AC
  Administered 2016-03-05: 5 mg via INTRAVENOUS
  Filled 2016-03-05: qty 5

## 2016-03-05 MED ORDER — SODIUM CHLORIDE 0.9% FLUSH
3.0000 mL | Freq: Two times a day (BID) | INTRAVENOUS | Status: DC
  Administered 2016-03-05 – 2016-03-19 (×25): 3 mL via INTRAVENOUS

## 2016-03-05 MED ORDER — ACETAMINOPHEN 325 MG PO TABS
650.0000 mg | ORAL_TABLET | ORAL | Status: DC | PRN
  Administered 2016-03-16 – 2016-03-20 (×3): 650 mg via ORAL
  Filled 2016-03-05 (×6): qty 2

## 2016-03-05 MED ORDER — HEPARIN BOLUS VIA INFUSION
2000.0000 [IU] | Freq: Once | INTRAVENOUS | Status: AC
  Administered 2016-03-05: 2000 [IU] via INTRAVENOUS
  Filled 2016-03-05: qty 2000

## 2016-03-05 MED ORDER — DARBEPOETIN ALFA 60 MCG/0.3ML IJ SOSY
60.0000 ug | PREFILLED_SYRINGE | INTRAMUSCULAR | Status: DC
  Administered 2016-03-06: 60 ug via INTRAVENOUS
  Filled 2016-03-05: qty 0.3

## 2016-03-05 MED ORDER — ASPIRIN 81 MG PO CHEW
324.0000 mg | CHEWABLE_TABLET | Freq: Once | ORAL | Status: AC
  Administered 2016-03-05: 324 mg via ORAL
  Filled 2016-03-05: qty 4

## 2016-03-05 MED ORDER — GABAPENTIN 300 MG PO CAPS
300.0000 mg | ORAL_CAPSULE | Freq: Every evening | ORAL | Status: DC
  Administered 2016-03-05 – 2016-03-19 (×13): 300 mg via ORAL
  Filled 2016-03-05 (×15): qty 1

## 2016-03-05 MED ORDER — TACROLIMUS 1 MG PO CAPS
2.0000 mg | ORAL_CAPSULE | Freq: Two times a day (BID) | ORAL | Status: DC
Start: 2016-03-05 — End: 2016-03-20
  Administered 2016-03-05 – 2016-03-20 (×30): 2 mg via ORAL
  Filled 2016-03-05 (×33): qty 2

## 2016-03-05 MED ORDER — HYDROMORPHONE HCL 2 MG/ML IJ SOLN
1.0000 mg | Freq: Once | INTRAMUSCULAR | Status: AC
Start: 2016-03-05 — End: 2016-03-05
  Administered 2016-03-05: 1 mg via INTRAVENOUS
  Filled 2016-03-05: qty 1

## 2016-03-05 MED ORDER — ONDANSETRON 4 MG PO TBDP
4.0000 mg | ORAL_TABLET | Freq: Three times a day (TID) | ORAL | Status: DC | PRN
  Filled 2016-03-05: qty 1

## 2016-03-05 MED ORDER — INSULIN ASPART 100 UNIT/ML ~~LOC~~ SOLN: 0.0000 [IU] | Freq: Every day | SUBCUTANEOUS | Status: DC

## 2016-03-05 MED ORDER — HEPARIN (PORCINE) IN NACL 100-0.45 UNIT/ML-% IJ SOLN
1100.0000 [IU]/h | INTRAMUSCULAR | Status: DC
  Administered 2016-03-05: 700 [IU]/h via INTRAVENOUS
  Administered 2016-03-06: 1100 [IU]/h via INTRAVENOUS
  Filled 2016-03-05 (×2): qty 250

## 2016-03-05 MED ORDER — OXYCODONE HCL 5 MG PO TABS
5.0000 mg | ORAL_TABLET | ORAL | Status: DC | PRN
  Administered 2016-03-05 – 2016-03-20 (×17): 5 mg via ORAL
  Filled 2016-03-05 (×15): qty 1

## 2016-03-05 MED ORDER — CALCITRIOL 0.25 MCG PO CAPS
0.2500 ug | ORAL_CAPSULE | ORAL | Status: DC
  Administered 2016-03-06 – 2016-03-09 (×2): 0.25 ug via ORAL
  Filled 2016-03-05: qty 1

## 2016-03-05 MED ORDER — MORPHINE SULFATE (PF) 4 MG/ML IV SOLN
2.0000 mg | INTRAVENOUS | Status: DC | PRN
  Administered 2016-03-05 – 2016-03-10 (×11): 2 mg via INTRAVENOUS
  Filled 2016-03-05 (×10): qty 1

## 2016-03-05 MED ORDER — SENNOSIDES-DOCUSATE SODIUM 8.6-50 MG PO TABS
1.0000 | ORAL_TABLET | Freq: Every day | ORAL | Status: DC
  Administered 2016-03-06 – 2016-03-20 (×14): 1 via ORAL
  Filled 2016-03-05 (×14): qty 1

## 2016-03-05 MED ORDER — INSULIN DETEMIR 100 UNIT/ML ~~LOC~~ SOLN
5.0000 [IU] | Freq: Every day | SUBCUTANEOUS | Status: DC
  Administered 2016-03-05 – 2016-03-19 (×12): 5 [IU] via SUBCUTANEOUS
  Filled 2016-03-05 (×19): qty 0.05

## 2016-03-05 MED ORDER — INSULIN ASPART 100 UNIT/ML ~~LOC~~ SOLN
0.0000 [IU] | Freq: Three times a day (TID) | SUBCUTANEOUS | Status: DC
  Administered 2016-03-05 – 2016-03-10 (×2): 1 [IU] via SUBCUTANEOUS
  Administered 2016-03-15: 2 [IU] via SUBCUTANEOUS

## 2016-03-05 MED ORDER — METOPROLOL TARTRATE 25 MG PO TABS: 25.0000 mg | ORAL_TABLET | Freq: Two times a day (BID) | ORAL | Status: DC

## 2016-03-05 MED ORDER — RENA-VITE PO TABS
1.0000 | ORAL_TABLET | Freq: Every day | ORAL | Status: DC
  Administered 2016-03-05 – 2016-03-19 (×15): 1 via ORAL
  Filled 2016-03-05 (×16): qty 1

## 2016-03-05 MED ORDER — LEVOTHYROXINE SODIUM 50 MCG PO TABS
175.0000 ug | ORAL_TABLET | Freq: Every day | ORAL | Status: DC
  Administered 2016-03-06 – 2016-03-20 (×15): 175 ug via ORAL
  Filled 2016-03-05 (×5): qty 1
  Filled 2016-03-05: qty 2
  Filled 2016-03-05 (×10): qty 1

## 2016-03-05 MED ORDER — CALCIUM ACETATE (PHOS BINDER) 667 MG PO CAPS
667.0000 mg | ORAL_CAPSULE | Freq: Three times a day (TID) | ORAL | Status: DC
  Administered 2016-03-05 – 2016-03-20 (×25): 667 mg via ORAL
  Filled 2016-03-05 (×31): qty 1

## 2016-03-05 MED ORDER — METOPROLOL TARTRATE 50 MG PO TABS
50.0000 mg | ORAL_TABLET | Freq: Three times a day (TID) | ORAL | Status: DC
  Administered 2016-03-05 – 2016-03-06 (×4): 50 mg via ORAL
  Filled 2016-03-05 (×4): qty 1

## 2016-03-05 NOTE — ED Notes (Signed)
Patient transported to vascular. 

## 2016-03-05 NOTE — ED Triage Notes (Signed)
Pt arrives from dialysis center via EMS reporting LLE pain ongoing for 3-4 months, gradually worsening.  Pt reports taking oxycodone for pain, states "Pain pills just stopped working". Pt reports hx SVT, DVT.  Pt reports CP L sided, 7/10, leg pain 10/10. Denies SOB, N/V, LOC.  Pt terarful.

## 2016-03-05 NOTE — H&P (Signed)
History and Physical    [name] [id number] DOB: 1956-02-07 DOA: 03/05/2016  PCP: No PCP Per Patient Patient coming from: home  Chief Complaint: left foot/leg pain/palpitations  HPI: [name] is a 61 y.o. female with medical history significant end-stage renal disease on Tuesday Thursday Saturday dialysis, CAD with LIMA, AVR, liver transplant, hep C, COPD, diabetes, hypertension, SVT presents to the emergency Department chief complaint persistent/worsening left foot/leg pain and palpitations with chest pain. Initial evaluation reveals elevated troponin, leukocytosis and a flutter HR 180  Information is obtained from the patient and the chart. Patient states she's been in her usual state of health until this morning she went to dialysis and complained of left anterior chest pain/palpitations.. She also complains of left lower extremity pain it's been going on for 3-4 months but has worsened. She denies shortness of breath diaphoresis nausea vomiting. She denies lower extremity edema or orthopnea. She describes the pain as an ache located left anterior nonradiating. Nothing makes it better or worse. She states she's been on dialysis since 2011. She does not make urine.    ED Course: In the emergency department she's afebrile hemodynamically stable with a heart rate in the 130s. She is provided with 5 mg of metoprolol aspirin and Dilaudid. At the time of evaluation she rates the pain a 5 out of 10 on her chest and a 10 out of 10 on the left foot  Review of Systems: As per HPI otherwise 10 point review of systems negative.   Ambulatory Status:uses a walker.   Past Medical History:  Diagnosis Date  . Anemia    takes Folic Acid daily  . Anxiety   . Aortic stenosis   . Arthritis    "left hand, back" (08/30/2012)  . Asthma   . CAD (coronary artery disease)   . CAD (coronary artery disease) Jan. 2015   Cath: 20% LAD, 50% D1; s/p LIMA-LAD  . Chronic back pain   . Chronic  bronchitis ([place])    "q yr w/season changes" (08/30/2012)  . Chronic constipation    takes MIralax and Colace daily  . COPD (chronic obstructive pulmonary disease) ([place])   . Depression    takes Cymbalta for "severe" depression  . End stage renal disease on dialysis [facility]) 02/27/2011   Kidneys shut down at time of liver transplant in Sept 2011 at [facility] in [place], she has been on HD ever since.  Dialyzes at [facility] HD on TTS schedule.  Had L forearm graft used 10 months then removed Dec 2012 due to suspected infection.  A right upper arm AVG was placed Dec 2012 but she developed steal symptoms acutely and it was ligated the same day.  Never had an AV fistula due to small veins.  Now has L thigh AVG put in Jan 2013, has not clotted to date.    [name] GERD (gastroesophageal reflux disease)    takes Omeprazole daily  . Headache    "at least monthly" (08/30/2012)  . Hepatitis C   . History of blood transfusion    "several" (08/30/2012)  . Hypertension    takes Metoprolol and Lisinopril daily, sees Dr Bea Graff  . Hypothyroidism    takes Synthroid daily  . Migraine    "last migraine was in 2013" (08/30/2012)  . Neuromuscular disorder ([place])    carpal tunnel in right hand  . Obesity   . Peripheral vascular disease (HCC) hands and legs  . Pneumonia    "today and several  times before" (08/30/2012)  . 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 SVT (supraventricular tachycardia) ([place]) 06/09/14  . Tobacco abuse   . Type II diabetes mellitus (HCC)    Levemir 2units daily if > 150    Past Surgical History:  Procedure Laterality Date  . AORTIC VALVE REPLACEMENT N/A 03/15/2013   AVR; Surgeon: Ivin Poot, MD;  Location: [facility] OR; Open Heart Surgery;  37mm[employer] mechanical prosthesis, top hat valve  . ARTERIOVENOUS GRAFT PLACEMENT Left 10/03/10    forearm  . AV FISTULA  PLACEMENT  01/29/2011   Procedure: INSERTION OF ARTERIOVENOUS (AV) GORE-TEX GRAFT ARM;  Surgeon: Elam Dutch, MD;  Location: [facility] OR;  Service: Vascular;  Laterality: Right;  . AV FISTULA PLACEMENT  03/10/2011   Procedure: INSERTION OF ARTERIOVENOUS (AV) GORE-TEX GRAFT THIGH;  Surgeon: Elam Dutch, MD;  Location: [place];  Service: Vascular;  Laterality: Left;  . [place] REMOVAL  12/23/2010   Procedure: REMOVAL OF ARTERIOVENOUS GORETEX GRAFT ([place]);  Surgeon: Elam Dutch, MD;  Location: [place];  Service: Vascular;  Laterality: Left;  procedure started @1736 -1852  . CHOLECYSTECTOMY  1993  . CORONARY ARTERY BYPASS GRAFT N/A 03/15/2013   Procedure: CORONARY ARTERY BYPASS GRAFTING (CABG) times one using left internal mammary artery.;  Surgeon: Ivin Poot, MD;  Location: [place];  Service: Open Heart Surgery;  Laterality: N/A;  POSS CABG X 1  . CYSTOSCOPY  1990's  . INSERTION OF DIALYSIS CATHETER  12/23/2010   Procedure: INSERTION OF DIALYSIS CATHETER;  Surgeon: Elam Dutch, MD;  Location: [place];  Service: Vascular;  Laterality: Right;  Right Internal Jugular 28cm dialysis catheter insertion procedure time 1701-1720   . INTRAOPERATIVE TRANSESOPHAGEAL ECHOCARDIOGRAM N/A 03/15/2013   Procedure: INTRAOPERATIVE TRANSESOPHAGEAL ECHOCARDIOGRAM;  Surgeon: Ivin Poot, MD;  Location: [place];  Service: Open Heart Surgery;  Laterality: N/A;  . LEFT HEART CATHETERIZATION WITH CORONARY ANGIOGRAM N/A 07/29/2012   Procedure: LEFT HEART CATHETERIZATION WITH CORONARY ANGIOGRAM;  Surgeon: Troy Sine, MD;  Location: [facility] CATH LAB;  Service: Cardiovascular;  Laterality: N/A;  . LEFT HEART CATHETERIZATION WITH CORONARY ANGIOGRAM N/A 03/10/2013   Procedure: LEFT HEART CATHETERIZATION WITH CORONARY ANGIOGRAM;  Surgeon: Troy Sine, MD;  Location: [facility] CATH LAB;  Service: Cardiovascular;  Laterality: N/A;  . LEFT HEART CATHETERIZATION WITH CORONARY/GRAFT ANGIOGRAM N/A 12/24/2011   Procedure: LEFT HEART  CATHETERIZATION WITH Beatrix Fetters;  Surgeon: [name], MD;  Location: [facility] CATH LAB;  Service: Cardiovascular;  Laterality: N/A;  . LEFT HEART CATHETERIZATION WITH CORONARY/GRAFT ANGIOGRAM N/A 12/16/2013   Procedure: LEFT HEART CATHETERIZATION WITH Beatrix Fetters;  Surgeon: Troy Sine, MD;  Location: [facility] CATH LAB;  Service: Cardiovascular;  Laterality: N/A;  . LIVER TRANSPLANT  10/25/2009   sees Dr Ferol Luz 1 every 6 months, saw last in Dec 2013. [name] Coord [phone]  . PERIPHERAL VASCULAR CATHETERIZATION N/A 11/06/2015   Procedure: Abdominal Aortogram;  Surgeon: Serafina Mitchell, MD;  Location: [place] CV LAB;  Service: Cardiovascular;  Laterality: N/A;  . PERIPHERAL VASCULAR CATHETERIZATION N/A 11/06/2015   Procedure: Lower Extremity Angiography;  Surgeon: Serafina Mitchell, MD;  Location: [place] CV LAB;  Service: Cardiovascular;  Laterality: N/A;  . PERIPHERAL VASCULAR CATHETERIZATION  11/06/2015   Procedure: Peripheral Vascular Atherectomy;  Surgeon: Serafina Mitchell, MD;  Location: [place] CV LAB;  Service: Cardiovascular;;  Left Superficial femoral  . SHUNTOGRAM Left 05/15/2014   Procedure: SHUNTOGRAM;  Surgeon: Conrad Mountain Road, MD;  Location: [facility] CATH LAB;  Service: Cardiovascular;  Laterality: Left;  . SMALL INTESTINE SURGERY  90's  . SPINAL GROWTH RODS  2010   "put 2 metal rods in my back; they had detetriorated" (08/30/2012)  . THROMBECTOMY    . THROMBECTOMY AND REVISION OF ARTERIOVENTOUS (AV) GORETEX  GRAFT Left 03/30/2014  . THROMBECTOMY AND REVISION OF ARTERIOVENTOUS (AV) GORETEX  GRAFT Left 03/30/2014   Procedure: THROMBECTOMY AND REVISION OF ARTERIOVENTOUS (AV) GORETEX  GRAFT;  Surgeon: Conrad Austell, MD;  Location: [place];  Service: Vascular;  Laterality: Left;  . TUBAL LIGATION  1990's    Social History   Social History  . Marital status: Divorced    Spouse name: N/A  . Number of children: N/A  . Years of education: N/A    Occupational History  . Not on file.   Social History Main Topics  . Smoking status: Current Every Day Smoker    Packs/day: 0.50    Years: 40.00    Types: Cigarettes  . Smokeless tobacco: Never Used     Comment: Almost 1 pk per day  . Alcohol use No  . Drug use: No  . Sexual activity: No   Other Topics Concern  . Not on file   Social History Narrative  . No narrative on file    Allergies  Allergen Reactions  . Acetaminophen Other (See Comments)    Liver transplant recipient   . Codeine Itching  . Mirtazapine Other (See Comments)    hallucination  . Morphine Itching and Other (See Comments)    hallucinate    Family History  Problem Relation Age of Onset  . Cancer Mother   . Diabetes Mother   . Hypertension Mother   . Stroke Mother   . Cancer Father   . Anesthesia problems Neg Hx   . Hypotension Neg Hx   . Malignant hyperthermia Neg Hx   . Pseudochol deficiency Neg Hx     Prior to Admission medications   Medication Sig Start Date End Date Taking? Authorizing Provider  aspirin EC 81 MG tablet Take 81 mg by mouth daily.   Yes Historical Provider, MD  calcium acetate (PHOSLO) 667 MG capsule Take 1 capsule (667 mg total) by mouth 3 (three) times daily with meals. 10/16/15  Yes Orson Eva, MD  gabapentin (NEURONTIN) 300 MG capsule Take 300 mg by mouth every evening.   Yes Historical Provider, MD  Insulin Detemir (LEVEMIR FLEXPEN) 100 UNIT/ML Pen Inject 2-6 Units into the skin See admin instructions. Only if cbg is over 200 12/27/15  Yes Historical Provider, MD  levothyroxine (SYNTHROID, LEVOTHROID) 175 MCG tablet Take 1 tablet (175 mcg total) by mouth daily before breakfast. 02/15/16  Yes Troy Sine, MD  metoprolol tartrate (LOPRESSOR) 25 MG tablet Take 25 mg by mouth 2 (two) times daily. 03/16/14  Yes Historical Provider, MD  ondansetron (ZOFRAN ODT) 4 MG disintegrating tablet Take 1 tablet (4 mg total) by mouth every 8 (eight) hours as needed for nausea. 08/13/15  Yes  Tanna Furry, MD  oxyCODONE (OXY IR/ROXICODONE) 5 MG immediate release tablet Take 5 mg by mouth every 4 (four) hours as needed. 03/03/16  Yes Historical Provider, MD  senna-docusate (SENOKOT-S) 8.6-50 MG tablet Take 1 tablet by mouth daily.   Yes Historical Provider, MD  tacrolimus (PROGRAF) 1 MG  capsule Take 2 capsules (2 mg total) by mouth 2 (two) times daily. Take 2 capsules twice daily. 11/29/15  Yes Maryann Mikhail, DO  warfarin (COUMADIN) 2 MG tablet Take 1- 2 tablets by mouth daily or as directed by coumadin clinic Patient taking differently: Take 2 mg by mouth daily. Take as directed by coumadin clinic 11/01/15  Yes Troy Sine, MD  nitroGLYCERIN (NITROSTAT) 0.4 MG SL tablet Place 1 tablet (0.4 mg total) under the tongue every 5 (five) minutes as needed for chest pain. 03/20/14   Albertine Patricia, MD    Physical Exam: Vitals:   03/05/16 1130 03/05/16 1215 03/05/16 1245 03/05/16 1315  BP: 108/69     Pulse: (!) 133 (!) 132 (!) 133 (!) 132  Resp: 20 25 (!) 31 12  Temp:      TempSrc:      SpO2: 96% 94% (!) 89% 97%  Weight:      Height:         General:  Appears calm and comfortable No acute distress Eyes:  PERRL, EOMI, normal lids, iris ENT:  grossly normal hearing, lips & tongue, mmm is membranes of her mouth are pink slightly dry Neck:  no LAD, masses or thyromegaly Cardiovascular:  RRR, +click. Trace bilateral LE edema.  Respiratory:  CTA bilaterally, no w/r/r. Normal respiratory effort. Abdomen:  soft, ntnd, is a bowel sounds no guarding or rebounding Skin:  no rash or induration seen on limited exam left heel with dime size ulcer appears to be healing. Left heel quite tender to touch Musculoskeletal:  grossly normal tone BUE/BLE, good ROM, no bony abnormality joints without swelling/erythema  Psychiatric:  grossly normal mood and affect, speech fluent and appropriate, AOx3 Neurologic:  CN 2-12 grossly intact, moves all extremities in coordinated fashion, sensation  intact  Labs on Admission: I have personally reviewed following labs and imaging studies  CBC:  Recent Labs Lab 03/05/16 0927  WBC 17.2*  HGB 9.8*  HCT 30.1*  MCV 97.1  PLT 0000000   Basic Metabolic Panel:  Recent Labs Lab 03/05/16 0927  NA 134*  K 5.2*  CL 94*  CO2 25  GLUCOSE 167*  BUN 43*  CREATININE 8.11*  CALCIUM 8.3*   GFR: Estimated Creatinine Clearance: 6.4 mL/min (by C-G formula based on SCr of 8.11 mg/dL (H)). Liver Function Tests: No results for input(s): AST, ALT, ALKPHOS, BILITOT, PROT, ALBUMIN in the last 168 hours. No results for input(s): LIPASE, AMYLASE in the last 168 hours. No results for input(s): AMMONIA in the last 168 hours. Coagulation Profile:  Recent Labs Lab 03/05/16 0927  INR 1.81   Cardiac Enzymes: No results for input(s): CKTOTAL, CKMB, CKMBINDEX, TROPONINI in the last 168 hours. BNP (last 3 results) No results for input(s): PROBNP in the last 8760 hours. HbA1C: No results for input(s): HGBA1C in the last 72 hours. CBG: No results for input(s): GLUCAP in the last 168 hours. Lipid Profile: No results for input(s): CHOL, HDL, LDLCALC, TRIG, CHOLHDL, LDLDIRECT in the last 72 hours. Thyroid Function Tests: No results for input(s): TSH, T4TOTAL, FREET4, T3FREE, THYROIDAB in the last 72 hours. Anemia Panel: No results for input(s): VITAMINB12, FOLATE, FERRITIN, TIBC, IRON, RETICCTPCT in the last 72 hours. Urine analysis:    Component Value Date/Time   COLORURINE YELLOW 11/02/2012 0428   APPEARANCEUR CLOUDY (A) 11/02/2012 0428   LABSPEC 1.011 11/02/2012 0428   PHURINE 8.0 11/02/2012 0428   GLUCOSEU 250 (A) 11/02/2012 0428   HGBUR LARGE (A) 11/02/2012 [id number]  BILIRUBINUR NEGATIVE 11/02/2012 0428   KETONESUR NEGATIVE 11/02/2012 0428   PROTEINUR >300 (A) 11/02/2012 0428   UROBILINOGEN 0.2 11/02/2012 0428   NITRITE NEGATIVE 11/02/2012 0428   LEUKOCYTESUR NEGATIVE 11/02/2012 0428    Creatinine Clearance: Estimated Creatinine  Clearance: 6.4 mL/min (by C-G formula based on SCr of 8.11 mg/dL (H)).  Sepsis Labs: @LABRCNTIP (procalcitonin:4,lacticidven:4) )No results found for this or any previous visit (from the past 240 hour(s)).   Radiological Exams on Admission: Dg Chest 2 View  Result Date: 03/05/2016 CLINICAL DATA:  Diabetes, coronary artery disease, liver transplant patient, history of asthma. No reported chest complaints. The patient is complaining of foot pain. EXAM: CHEST  2 VIEW COMPARISON:  PA and lateral chest x-ray of November 26, 2015 FINDINGS: The lungs are adequately inflated. There is a trace of pleural fluid at the left lung base. The interstitial markings are mildly prominent though stable. The heart is top-normal in size. There is a prosthetic aortic valve in place. The sternal wires are intact. The pulmonary vascularity is not engorged. There is calcification in the wall of the aortic arch. The bony thorax exhibits no acute abnormality. IMPRESSION: Small left pleural effusion. Mild interstitial prominence which is not entirely new. No alveolar pneumonia nor CHF. Thoracic aortic atherosclerosis. Electronically Signed   By: David  Martinique M.D.   On: 03/05/2016 08:50    EKG: Independently reviewed. Sinus tachycardia Ventricular premature complex Left anterior fascicular block Probable anteroseptal infarct, recent Lateral leads are also involved  Assessment/Plan Principal Problem:   Chest pain Active Problems:   Essential hypertension   GERD   End stage renal disease on dialysis-TTS   CAD- s/p LIMA-LAD Feb. 2015   COPD (chronic obstructive pulmonary disease) (HCC)   Paroxysmal supraventricular tachycardia, s/p valve replacement, with chest pain. resolved as outpatient   DM type 2 (diabetes mellitus, type 2) ([place])   S/P liver transplant ([place])   Chronic hepatitis C without hepatic coma (HCC)   Diastolic CHF, chronic (HCC)   Elevated troponin   Leg pain   #1. Chest pain/elevated troponin. Heart  score 6. Provided with fluids and analgesia in ED. HR improved to 130. Fairly significant cardiac history. history of CAD with LIMA to LAD, aVR. EKG as noted above. Troponin 0.65. He is provided with Dilaudid continues to have left anterior chest pain but reports is much improved. Chart review indicated last cath showed patent LIMA to LAD with nonobstructive CAD. Evaluated by cardiology who opined likely demand ischemia in the setting of elevated heart rate and possible infectious process -Admit to step down -Cycle troponin -Serial EKG -continue asa -lipid panel -supportive therapy -Appreciate cardiology assistance  #2. Palpitations/atrial flutter. Patient with history of paroxysmal SVT status post valve replacement reportedly heart rate 183 per EMs. Heart rate improved to 130 in the emergency department. Chest xray with small pleural effusion. Chadvasc score 4. Home medications include metoprolol, Coumadin -Heparin drip per cardiology -Coumadin per pharmacy -Continue home metoprolol -dialysis per nephrology -Defer further management cardiology  #3. End-stage renal disease on dialysis. Usually Tuesday Thursday Saturday schedule. Creatinine 8.11 appears to be well above her baseline -Nephrology consult requested -Potassium 5.2 -Monitor  #4. Diabetes, glucose 167, home medications include Levemir -Continue Levemir at a lower home dose -Sliding scale insulin for optimal control -Obtain a hemoglobin A1c  #5. Leukocytosis. WBC 17.2. Patient is afebrile and nontoxic appearing. She does have some healing wounds on the left. No erythema. If x-ray without infiltrate. Patient does not make urine. I'll assist site  without erythema swelling or tenderness -Monitor closely -wound consult  #'6. COPD. Appears stable at baseline. Not on home oxygen  #7. Chronic bilateral lower extremity pain. Likely related to #4. Ports worsening of late. Vascular lab with no evidence DVT. -Vascular consult -Pain  management      DVT prophylaxis: heparin gtt  Code Status: limited  Family Communication: none present  Disposition Plan: hoe  Consults called: nephrology, nishan cards  Admission status: obs    Dyanne Carrel M MD [facility]  If 7PM-7AM, please contact night-coverage [email or web address] Password [email or web address]  03/05/2016, 2:45 PM

## 2016-03-05 NOTE — Progress Notes (Signed)
ANTICOAGULATION CONSULT NOTE - Initial Consult  Pharmacy Consult for heparin Indication: atrial flutter, AVR   Allergies  Allergen Reactions  . Acetaminophen Other (See Comments)    Liver transplant recipient   . Codeine Itching  . Mirtazapine Other (See Comments)    hallucination  . Morphine Itching and Other (See Comments)    hallucinate    Patient Measurements: Height: 5' 4.5" (163.8 cm) Weight: 130 lb (59 kg) IBW/kg (Calculated) : 55.85 Heparin Dosing Weight: 59 kg  Vital Signs: Temp: 98.5 F (36.9 C) (01/24 0800) Temp Source: Oral (01/24 0800) BP: 108/69 (01/24 1130) Pulse Rate: 132 (01/24 1315)  Labs:  Recent Labs  03/05/16 0927  HGB 9.8*  HCT 30.1*  PLT 158  LABPROT 21.2*  INR 1.81  CREATININE 8.11*    Estimated Creatinine Clearance: 6.4 mL/min (by C-G formula based on SCr of 8.11 mg/dL (H)).   Medical History: Past Medical History:  Diagnosis Date  . Anemia    takes Folic Acid daily  . Anxiety   . Aortic stenosis   . Arthritis    "left hand, back" (08/30/2012)  . Asthma   . CAD (coronary artery disease)   . CAD (coronary artery disease) Jan. 2015   Cath: 20% LAD, 50% D1; s/p LIMA-LAD  . Chronic back pain   . Chronic bronchitis ([place])    "q yr w/season changes" (08/30/2012)  . Chronic constipation    takes MIralax and Colace daily  . COPD (chronic obstructive pulmonary disease) ([place])   . Depression    takes Cymbalta for "severe" depression  . End stage renal disease on dialysis [facility]) 02/27/2011   Kidneys shut down at time of liver transplant in Sept 2011 at [facility] in [place], she has been on HD ever since.  Dialyzes at [facility] HD on TTS schedule.  Had L forearm graft used 10 months then removed Dec 2012 due to suspected infection.  A right upper arm AVG was placed Dec 2012 but she developed steal symptoms acutely and it was ligated the same day.  Never had an AV fistula due to small veins.  Now has L thigh AVG put in Jan 2013, has not clotted to  date.    [name] GERD (gastroesophageal reflux disease)    takes Omeprazole daily  . Headache    "at least monthly" (08/30/2012)  . Hepatitis C   . History of blood transfusion    "several" (08/30/2012)  . Hypertension    takes Metoprolol and Lisinopril daily, sees Dr Bea Graff  . Hypothyroidism    takes Synthroid daily  . Migraine    "last migraine was in 2013" (08/30/2012)  . Neuromuscular disorder ([place])    carpal tunnel in right hand  . Obesity   . Peripheral vascular disease (HCC) hands and legs  . Pneumonia    "today and several times before" (08/30/2012)  . 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 SVT (supraventricular tachycardia) ([place]) 06/09/14  . Tobacco abuse   . Type II diabetes mellitus (HCC)    Levemir 2units daily if > 150    Assessment: Pharmacy consulted to dose heparin for atrial flutter and AVR.  Pt on coumadin PTA per coumadin clinic.  Home dose is 2 mg daily, last dose yesterday. Last coumadin clinic visit 02/29/16 with INR 2.7,  INR goal  2.5 - 3 per clinic notes.  Gets INRs drawn at HD center.  ESRD pt. INR 1.81. HG 9.8, pltc 18. Troponin 0.65.    Goal of Therapy:  Heparin level 0.3-0.7 units/ml Monitor platelets by anticoagulation protocol: Yes   Plan: heparin 2000 unit bolus and drip at 700 units/hr and check 8 hour heparin level Daily HL and CBC while on heparin  Eudelia Bunch, Pharm.D. [id number] 03/05/2016 2:42 PM

## 2016-03-05 NOTE — ED Notes (Signed)
Admitting MD at bedside.

## 2016-03-05 NOTE — ED Notes (Signed)
Nephrology at bedside

## 2016-03-05 NOTE — ED Notes (Signed)
Admitting NP at bedside

## 2016-03-05 NOTE — ED Notes (Signed)
ED Provider at bedside. 

## 2016-03-05 NOTE — Consult Note (Signed)
Cardiology Consult    Patient ID: [name] MRN: [id number], DOB/AGE: 1955/07/09   Admit date: 03/05/2016 Date of Consult: 03/05/2016  Primary Physician: No PCP Per Patient Primary Cardiologist: Dr. Johnsie Cancel Requesting Provider: Dr. Vanita Panda Reason for Consultation: Elevated Trop  Patient Profile    61 yo female with PMH of ESRD on HD, CAD with LIMA to LAD, AVR, Liver transplant, Hep C COPD, IDDM, SVT, and HTN who presented to the ED with left foot pain and left sided chest pain after feeling palpitations.   Past Medical History   Past Medical History:  Diagnosis Date  . Anemia    takes Folic Acid daily  . Anxiety   . Aortic stenosis   . Arthritis    "left hand, back" (08/30/2012)  . Asthma   . CAD (coronary artery disease)   . CAD (coronary artery disease) Jan. 2015   Cath: 20% LAD, 50% D1; s/p LIMA-LAD  . Chronic back pain   . Chronic bronchitis ([place])    "q yr w/season changes" (08/30/2012)  . Chronic constipation    takes MIralax and Colace daily  . COPD (chronic obstructive pulmonary disease) ([place])   . Depression    takes Cymbalta for "severe" depression  . End stage renal disease on dialysis [facility]) 02/27/2011   Kidneys shut down at time of liver transplant in Sept 2011 at [facility] in [place], she has been on HD ever since.  Dialyzes at [facility] HD on TTS schedule.  Had L forearm graft used 10 months then removed Dec 2012 due to suspected infection.  A right upper arm AVG was placed Dec 2012 but she developed steal symptoms acutely and it was ligated the same day.  Never had an AV fistula due to small veins.  Now has L thigh AVG put in Jan 2013, has not clotted to date.    [name] GERD (gastroesophageal reflux disease)    takes Omeprazole daily  . Headache    "at least monthly" (08/30/2012)  . Hepatitis C   . History of blood transfusion    "several" (08/30/2012)  . Hypertension    takes Metoprolol and Lisinopril daily, sees Dr Bea Graff  . Hypothyroidism    takes Synthroid  daily  . Migraine    "last migraine was in 2013" (08/30/2012)  . Neuromuscular disorder ([place])    carpal tunnel in right hand  . Obesity   . Peripheral vascular disease (HCC) hands and legs  . Pneumonia    "today and several times before" (08/30/2012)  . 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 SVT (supraventricular tachycardia) ([place]) 06/09/14  . Tobacco abuse   . Type II diabetes mellitus (HCC)    Levemir 2units daily if > 150    Past Surgical History:  Procedure Laterality Date  . AORTIC VALVE REPLACEMENT N/A 03/15/2013   AVR; Surgeon: Ivin Poot, MD;  Location: [facility] OR; Open Heart Surgery;  32mm[employer] mechanical prosthesis, top hat valve  . ARTERIOVENOUS GRAFT PLACEMENT Left 10/03/10    forearm  . AV FISTULA PLACEMENT  01/29/2011   Procedure: INSERTION OF ARTERIOVENOUS (AV) GORE-TEX GRAFT ARM;  Surgeon: Elam Dutch, MD;  Location: [place];  Service: Vascular;  Laterality: Right;  . AV FISTULA PLACEMENT  03/10/2011   Procedure: INSERTION OF ARTERIOVENOUS (AV) GORE-TEX GRAFT THIGH;  Surgeon: Elam Dutch, MD;  Location: [place];  Service: Vascular;  Laterality: Left;  . [place] REMOVAL  12/23/2010   Procedure: REMOVAL OF ARTERIOVENOUS GORETEX GRAFT ([place]);  Surgeon: Elam Dutch, MD;  Location: [place];  Service: Vascular;  Laterality: Left;  procedure started @1736 -1852  . CHOLECYSTECTOMY  1993  . CORONARY ARTERY BYPASS GRAFT N/A 03/15/2013   Procedure: CORONARY ARTERY BYPASS GRAFTING (CABG) times one using left internal mammary artery.;  Surgeon: Ivin Poot, MD;  Location: [place];  Service: Open Heart Surgery;  Laterality: N/A;  POSS CABG X 1  . CYSTOSCOPY  1990's  . INSERTION OF DIALYSIS CATHETER  12/23/2010   Procedure: INSERTION OF DIALYSIS CATHETER;  Surgeon: Elam Dutch, MD;  Location: [place];  Service: Vascular;  Laterality:  Right;  Right Internal Jugular 28cm dialysis catheter insertion procedure time 1701-1720   . INTRAOPERATIVE TRANSESOPHAGEAL ECHOCARDIOGRAM N/A 03/15/2013   Procedure: INTRAOPERATIVE TRANSESOPHAGEAL ECHOCARDIOGRAM;  Surgeon: Ivin Poot, MD;  Location: [place];  Service: Open Heart Surgery;  Laterality: N/A;  . LEFT HEART CATHETERIZATION WITH CORONARY ANGIOGRAM N/A 07/29/2012   Procedure: LEFT HEART CATHETERIZATION WITH CORONARY ANGIOGRAM;  Surgeon: Troy Sine, MD;  Location: [facility] CATH LAB;  Service: Cardiovascular;  Laterality: N/A;  . LEFT HEART CATHETERIZATION WITH CORONARY ANGIOGRAM N/A 03/10/2013   Procedure: LEFT HEART CATHETERIZATION WITH CORONARY ANGIOGRAM;  Surgeon: Troy Sine, MD;  Location: [facility] CATH LAB;  Service: Cardiovascular;  Laterality: N/A;  . LEFT HEART CATHETERIZATION WITH CORONARY/GRAFT ANGIOGRAM N/A 12/24/2011   Procedure: LEFT HEART CATHETERIZATION WITH Beatrix Fetters;  Surgeon: [name], MD;  Location: [facility] CATH LAB;  Service: Cardiovascular;  Laterality: N/A;  . LEFT HEART CATHETERIZATION WITH CORONARY/GRAFT ANGIOGRAM N/A 12/16/2013   Procedure: LEFT HEART CATHETERIZATION WITH Beatrix Fetters;  Surgeon: Troy Sine, MD;  Location: [facility] CATH LAB;  Service: Cardiovascular;  Laterality: N/A;  . LIVER TRANSPLANT  10/25/2009   sees Dr Ferol Luz 1 every 6 months, saw last in Dec 2013. [name] Coord [phone]  . PERIPHERAL VASCULAR CATHETERIZATION N/A 11/06/2015   Procedure: Abdominal Aortogram;  Surgeon: Serafina Mitchell, MD;  Location: [place] CV LAB;  Service: Cardiovascular;  Laterality: N/A;  . PERIPHERAL VASCULAR CATHETERIZATION N/A 11/06/2015   Procedure: Lower Extremity Angiography;  Surgeon: Serafina Mitchell, MD;  Location: [place] CV LAB;  Service: Cardiovascular;  Laterality: N/A;  . PERIPHERAL VASCULAR CATHETERIZATION  11/06/2015   Procedure: Peripheral Vascular Atherectomy;  Surgeon: Serafina Mitchell, MD;  Location: [place] CV  LAB;  Service: Cardiovascular;;  Left Superficial femoral  . SHUNTOGRAM Left 05/15/2014   Procedure: SHUNTOGRAM;  Surgeon: Conrad Monterey, MD;  Location: [facility] CATH LAB;  Service: Cardiovascular;  Laterality: Left;  . SMALL INTESTINE SURGERY  90's  . SPINAL GROWTH RODS  2010   "put 2 metal rods in my back; they had detetriorated" (08/30/2012)  . THROMBECTOMY    . THROMBECTOMY AND REVISION OF ARTERIOVENTOUS (AV) GORETEX  GRAFT Left 03/30/2014  . THROMBECTOMY AND REVISION OF ARTERIOVENTOUS (AV) GORETEX  GRAFT Left 03/30/2014   Procedure: THROMBECTOMY AND REVISION OF ARTERIOVENTOUS (AV) GORETEX  GRAFT;  Surgeon: Conrad Harrisonburg, MD;  Location: [place];  Service: Vascular;  Laterality: Left;  . TUBAL LIGATION  1990's     Allergies  Allergies  Allergen Reactions  . Acetaminophen Other (See Comments)    Liver transplant recipient   . Codeine Itching  . Mirtazapine Other (See Comments)    hallucination  .  Morphine Itching and Other (See Comments)    hallucinate    History of Present Illness    [name] is a 61 yo female with PMH of ESRD on HD, CAD with LIMA to LAD, AVR, Liver transplant, Hep C COPD, IDDM, SVT, and HTN. She is followed by Dr. [name] for her CAD.   After undergoing liver transplantation at [facility] for hepatitis C,  she was admitted to [facility] in January 2015 with increasing chest pain.  A dobutamine echo study suggested severe AS with an ejection fraction of 45%.  Cardiac catheterization revealed mild CAD with 50% narrowing in moderately severe aortic stenosis with aortic valve area in the range of 0.8-1.2.  She ultimately underwent aortic valve replacement with mechanical aortic valve and single vessel LIMA to LAD CABG revascularization surgery on 03/15/2013 by Dr. [name].  Following discharge, she presented to the hospital for further evaluation of chest pain and palpitations.  Troponins were negative.  Beta blocker therapy was increased to following discharge.   She  undergoes dialysis on Tuesday, Thursdays, and Saturdays.  She was  hospitalized from November 5 through 12/21/2013 and presented with chest pain episodes which occurred during dialysis.  Her ECG showed new T-wave inversion in inferior leads.  She underwent a repeat cardiac catheterization on November 6 by me which showed a focal 70% mid LAD stenosis after the takeoff of the second diagonal vessel, but the LIMA graft to the mid LAD was patent.  The left circumflex vessel had 20% smooth narrowing in the second marginal branch.  The large dominant RCA was normal.  Medical therapy was recommended. She was found to have AV graft bleeding/degeneration and underwent hemodialysis graft revision of her left thigh AV graft.  She was on heparin therapy during that time and ultimately was restarted back on Coumadin.  In 5/17 she was admitted to [facility] and found to have a left subcapsular hematoma of her kidney and underwent left nephrectomy. She was hospitalized in 11/17 with acute liver transplant rejection. Her bilirubin had increased to 5.7 and she had undergone liver biopsy in October, concerning for acute transplant rejection versus infection.  She was given multiple doses of Solu-Medrol with improvement of LFTs and bilirubin.  She is followed by the hepatology department at [facility]. She underwent ERCP on November 15 which demonstrated anastomotic stricture which was dilated followed by stent placement.  She was started on Levaquin post ERCP.  Last echo showed EF of 55-60% with no WMA and AV with mild regurg.   Reports she has had left foot pain over the past couple of weeks, and has ulcers to the heel and great toe. Being treated by her PCP for this. Was at dialysis today and developed palpitations, then left sided chest pain. Told her HR was in the 180s. In the ED her labs showed K+ 5.2, Cr 8.11, POC trop 0.65, Hgb 9.8, INR 1.81. CXR with small left pleural effusion. EKG appears to be Atrial Flutter.    Inpatient Medications      Family History    Family History  Problem Relation Age of Onset  . Cancer Mother   . Diabetes Mother   . Hypertension Mother   . Stroke Mother   . Cancer Father   . Anesthesia problems Neg Hx   . Hypotension Neg Hx   . Malignant hyperthermia Neg Hx   . Pseudochol deficiency Neg Hx     Social History    Social History  Social History  . Marital status: Divorced    Spouse name: N/A  . Number of children: N/A  . Years of education: N/A   Occupational History  . Not on file.   Social History Main Topics  . Smoking status: Current Every Day Smoker    Packs/day: 0.50    Years: 40.00    Types: Cigarettes  . Smokeless tobacco: Never Used     Comment: Almost 1 pk per day  . Alcohol use No  . Drug use: No  . Sexual activity: No   Other Topics Concern  . Not on file   Social History Narrative  . No narrative on file     Review of Systems    General:  No chills, fever, night sweats or weight changes.  Cardiovascular: See HPI Dermatological: No rash, lesions/masses Respiratory: No cough, dyspnea Urologic: No hematuria, dysuria Abdominal:   No nausea, vomiting, diarrhea, bright red blood per rectum, melena, or hematemesis Neurologic:  No visual changes, wkns, changes in mental status. All other systems reviewed and are otherwise negative except as noted above.  Physical Exam    Blood pressure 126/91, pulse (!) 137, temperature 98.5 F (36.9 C), temperature source Oral, resp. rate 13, height 5' 4.5" (1.638 m), weight 130 lb (59 kg), SpO2 99 %.  General: Pleasant older AAF, NAD Psych: Normal affect. Neuro: Alert and oriented X 3. Moves all extremities spontaneously. HEENT: Normal  Neck: Supple without bruits or JVD. Lungs:  Resp regular and unlabored, CTA. Heart: Irreg Irreg no s3, s4, or murmurs, mechanical valve click Abdomen: Soft, non-tender, non-distended, BS + x 4.  Extremities: No clubbing, cyanosis or edema.  DP/PT/Radials 2+ and equal bilaterally.  Labs    Troponin [facility] of Care Test)  Recent Labs  03/05/16 0942  TROPIPOC 0.65*   No results for input(s): CKTOTAL, CKMB, TROPONINI in the last 72 hours. Lab Results  Component Value Date   WBC 17.2 (H) 03/05/2016   HGB 9.8 (L) 03/05/2016   HCT 30.1 (L) 03/05/2016   MCV 97.1 03/05/2016   PLT 158 03/05/2016    Recent Labs Lab 03/05/16 0927  NA 134*  K 5.2*  CL 94*  CO2 25  BUN 43*  CREATININE 8.11*  CALCIUM 8.3*  GLUCOSE 167*   Lab Results  Component Value Date   CHOL 225 (H) 12/22/2011   HDL 97 12/22/2011   LDLCALC 104 (H) 12/22/2011   TRIG 121 12/22/2011   Lab Results  Component Value Date   DDIMER 0.40 12/16/2013     Radiology Studies    Dg Chest 2 View  Result Date: 03/05/2016 CLINICAL DATA:  Diabetes, coronary artery disease, liver transplant patient, history of asthma. No reported chest complaints. The patient is complaining of foot pain. EXAM: CHEST  2 VIEW COMPARISON:  PA and lateral chest x-ray of November 26, 2015 FINDINGS: The lungs are adequately inflated. There is a trace of pleural fluid at the left lung base. The interstitial markings are mildly prominent though stable. The heart is top-normal in size. There is a prosthetic aortic valve in place. The sternal wires are intact. The pulmonary vascularity is not engorged. There is calcification in the wall of the aortic arch. The bony thorax exhibits no acute abnormality. IMPRESSION: Small left pleural effusion. Mild interstitial prominence which is not entirely new. No alveolar pneumonia nor CHF. Thoracic aortic atherosclerosis. Electronically Signed   By: David  Martinique M.D.   On: 03/05/2016 08:50    ECG & Cardiac Imaging  EKG: Atrial Flutter  Echo: 10/17  Study Conclusions  - Left ventricle: The cavity size was normal. Wall thickness was   normal. Systolic function was normal. The estimated ejection   fraction was in the range of 55% to 60%. Wall  motion was normal;   there were no regional wall motion abnormalities. - Aortic valve: A mechanical prosthesis was present. There was mild   regurgitation. - Mitral valve: Mildly to moderately calcified annulus. Mildly   thickened leaflets . There was mild regurgitation. - Left atrium: The atrium was severely dilated.  Assessment & Plan    61 yo female with PMH of ESRD on HD, CAD with LIMA to LAD, AVR, Liver transplant, Hep C COPD, IDDM, SVT, and HTN who presented to the ED with left foot pain and left sided chest pain after feeling palpitations.   1. Elevated Trop: Reports she developed left sided chest pain after her heart started racing while at dialysis this morning. Denies any dyspnea or anginal symptoms prior to this episode. HR was reported in the 180s. Last cath showed patent LIMA to LAD, with nonobstructive CAD in the LCx. -- Likely demand ischemia in the setting of elevated HR and WBC? Infection in left foot?  -- Cycle enzymes  2. Atrial Flutter: no hx of the same. Developed prior to dialysis this morning. Rate now improved to 130s in the ED. This patients CHA2DS2-VASc Score and unadjusted Ischemic Stroke Rate (% per year) is equal to 4.8 % stroke rate/year from a score of 4 HTN, DM, Vascular=MI/PAD/Aortic Plaque Female -- on coumadin but INR is subtherapeutic. Will started IV heparin until she is therapeutic -- increase metoprolol to 50mg  TID to attempt better rate control and see if she converts. May need IV amiodarone.   3. AVR: Noted stable on last echo.   4. ESRD on HD: Nephrology following this admission. Hyperkalemia noted on admission, but did not receive her HD this morning.  5. IDDM: Management per primary  6. Elevated WBC: concern regarding left foot wounds. States she is being followed by her PCP for this. Denies any fever, cough, abd, n/v.    Signed, Reino Bellis, NP-C Pager [phone] 03/05/2016, 1:13 PM   Patient examined chart reviewed. Chronically ill  black female Exam with rapid irregular pulse. Failed dialysis crafts UE;s  S2 click with SEM through prosthetic valve and AR. Has functioning fistula in left groin with poor peripheral monophasic pulses. Heparin until INR Rx increase beta blocker for rate control Consider iv amiodarone but Producer, television/film/video of this and not imperative especially since conversion relatively contraindicated due to non Rx INR Echo to R/o source of embolus to leg. May benefit from TEE/DCC after INR Rx but given comorbidities would like to avoid Troponin elevation not significant and no chest pain on my interview. CABG fairly recent and patent LIMA by cath In 2016  [facility]

## 2016-03-05 NOTE — Consult Note (Signed)
Referring Physician: Domenic Moras PA [facility]  Patient name: [name] MRN: [id number] DOB: 11-Dec-1955 Sex: female  REASON FOR CONSULT: left foot pain  HPI: [name] is a 61 y.o. female known to our group.  Prior left SFA atherectomy and PTA by Dr Trula Slade about 3 months ago.  This was done for rest pain left foot.  The patient continues to have left foot pain.  She also has a left thigh AV graft.  She is a MWF dialysis patient.  She now has an ulcer on the left heel.   She was also admitted with chest pain and is currently having cardiac enzymes checked.  She is on chronic coumadin for mechanical aortic valve.  Other medical problems include prior liver transplant, CAD,, asthma, diabetes which are stable.  Past Medical History:  Diagnosis Date  . Anemia    takes Folic Acid daily  . Anxiety   . Aortic stenosis   . Arthritis    "left hand, back" (08/30/2012)  . Asthma   . CAD (coronary artery disease)   . CAD (coronary artery disease) Jan. 2015   Cath: 20% LAD, 50% D1; s/p LIMA-LAD  . Chronic back pain   . Chronic bronchitis ([place])    "q yr w/season changes" (08/30/2012)  . Chronic constipation    takes MIralax and Colace daily  . COPD (chronic obstructive pulmonary disease) ([place])   . Depression    takes Cymbalta for "severe" depression  . End stage renal disease on dialysis [facility]) 02/27/2011   Kidneys shut down at time of liver transplant in Sept 2011 at [facility] in [place], she has been on HD ever since.  Dialyzes at [facility] HD on TTS schedule.  Had L forearm graft used 10 months then removed Dec 2012 due to suspected infection.  A right upper arm AVG was placed Dec 2012 but she developed steal symptoms acutely and it was ligated the same day.  Never had an AV fistula due to small veins.  Now has L thigh AVG put in Jan 2013, has not clotted to date.    [name] GERD (gastroesophageal reflux disease)    takes Omeprazole daily  . Headache    "at least monthly" (08/30/2012)  .  Hepatitis C   . History of blood transfusion    "several" (08/30/2012)  . Hypertension    takes Metoprolol and Lisinopril daily, sees Dr Bea Graff  . Hypothyroidism    takes Synthroid daily  . Migraine    "last migraine was in 2013" (08/30/2012)  . Neuromuscular disorder ([place])    carpal tunnel in right hand  . Obesity   . Peripheral vascular disease (HCC) hands and legs  . Pneumonia    "today and several times before" (08/30/2012)  . 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 SVT (supraventricular tachycardia) ([place]) 06/09/14  . Tobacco abuse   . Type II diabetes mellitus (HCC)    Levemir 2units daily if > 150   Past Surgical History:  Procedure Laterality Date  . AORTIC VALVE REPLACEMENT N/A 03/15/2013   AVR; Surgeon: Ivin Poot, MD;  Location: [facility] OR; Open Heart Surgery;  18mm[employer] mechanical prosthesis, top hat valve  . ARTERIOVENOUS GRAFT PLACEMENT Left 10/03/10    forearm  . AV FISTULA PLACEMENT  01/29/2011  Procedure: INSERTION OF ARTERIOVENOUS (AV) GORE-TEX GRAFT ARM;  Surgeon: Elam Dutch, MD;  Location: [facility] OR;  Service: Vascular;  Laterality: Right;  . AV FISTULA PLACEMENT  03/10/2011   Procedure: INSERTION OF ARTERIOVENOUS (AV) GORE-TEX GRAFT THIGH;  Surgeon: Elam Dutch, MD;  Location: [place];  Service: Vascular;  Laterality: Left;  . [place] REMOVAL  12/23/2010   Procedure: REMOVAL OF ARTERIOVENOUS GORETEX GRAFT ([place]);  Surgeon: Elam Dutch, MD;  Location: [place];  Service: Vascular;  Laterality: Left;  procedure started @1736 -1852  . CHOLECYSTECTOMY  1993  . CORONARY ARTERY BYPASS GRAFT N/A 03/15/2013   Procedure: CORONARY ARTERY BYPASS GRAFTING (CABG) times one using left internal mammary artery.;  Surgeon: Ivin Poot, MD;  Location: [place];  Service: Open Heart Surgery;  Laterality: N/A;  POSS CABG X 1  . CYSTOSCOPY   1990's  . INSERTION OF DIALYSIS CATHETER  12/23/2010   Procedure: INSERTION OF DIALYSIS CATHETER;  Surgeon: Elam Dutch, MD;  Location: [place];  Service: Vascular;  Laterality: Right;  Right Internal Jugular 28cm dialysis catheter insertion procedure time 1701-1720   . INTRAOPERATIVE TRANSESOPHAGEAL ECHOCARDIOGRAM N/A 03/15/2013   Procedure: INTRAOPERATIVE TRANSESOPHAGEAL ECHOCARDIOGRAM;  Surgeon: Ivin Poot, MD;  Location: [place];  Service: Open Heart Surgery;  Laterality: N/A;  . LEFT HEART CATHETERIZATION WITH CORONARY ANGIOGRAM N/A 07/29/2012   Procedure: LEFT HEART CATHETERIZATION WITH CORONARY ANGIOGRAM;  Surgeon: Troy Sine, MD;  Location: [facility] CATH LAB;  Service: Cardiovascular;  Laterality: N/A;  . LEFT HEART CATHETERIZATION WITH CORONARY ANGIOGRAM N/A 03/10/2013   Procedure: LEFT HEART CATHETERIZATION WITH CORONARY ANGIOGRAM;  Surgeon: Troy Sine, MD;  Location: [facility] CATH LAB;  Service: Cardiovascular;  Laterality: N/A;  . LEFT HEART CATHETERIZATION WITH CORONARY/GRAFT ANGIOGRAM N/A 12/24/2011   Procedure: LEFT HEART CATHETERIZATION WITH Beatrix Fetters;  Surgeon: [name], MD;  Location: [facility] CATH LAB;  Service: Cardiovascular;  Laterality: N/A;  . LEFT HEART CATHETERIZATION WITH CORONARY/GRAFT ANGIOGRAM N/A 12/16/2013   Procedure: LEFT HEART CATHETERIZATION WITH Beatrix Fetters;  Surgeon: Troy Sine, MD;  Location: [facility] CATH LAB;  Service: Cardiovascular;  Laterality: N/A;  . LIVER TRANSPLANT  10/25/2009   sees Dr Ferol Luz 1 every 6 months, saw last in Dec 2013. [name] Coord [phone]  . PERIPHERAL VASCULAR CATHETERIZATION N/A 11/06/2015   Procedure: Abdominal Aortogram;  Surgeon: Serafina Mitchell, MD;  Location: [place] CV LAB;  Service: Cardiovascular;  Laterality: N/A;  . PERIPHERAL VASCULAR CATHETERIZATION N/A 11/06/2015   Procedure: Lower Extremity Angiography;  Surgeon: Serafina Mitchell, MD;  Location: [place] CV LAB;  Service:  Cardiovascular;  Laterality: N/A;  . PERIPHERAL VASCULAR CATHETERIZATION  11/06/2015   Procedure: Peripheral Vascular Atherectomy;  Surgeon: Serafina Mitchell, MD;  Location: [place] CV LAB;  Service: Cardiovascular;;  Left Superficial femoral  . SHUNTOGRAM Left 05/15/2014   Procedure: SHUNTOGRAM;  Surgeon: Conrad Plainfield, MD;  Location: [facility] CATH LAB;  Service: Cardiovascular;  Laterality: Left;  . SMALL INTESTINE SURGERY  90's  . SPINAL GROWTH RODS  2010   "put 2 metal rods in my back; they had detetriorated" (08/30/2012)  . THROMBECTOMY    . THROMBECTOMY AND REVISION OF ARTERIOVENTOUS (AV) GORETEX  GRAFT Left 03/30/2014  . THROMBECTOMY AND REVISION OF ARTERIOVENTOUS (AV) GORETEX  GRAFT Left 03/30/2014   Procedure: THROMBECTOMY AND REVISION OF ARTERIOVENTOUS (AV) GORETEX  GRAFT;  Surgeon: Conrad Patoka, MD;  Location: [place];  Service: Vascular;  Laterality: Left;  . TUBAL  LIGATION  1990's    Family History  Problem Relation Age of Onset  . Cancer Mother   . Diabetes Mother   . Hypertension Mother   . Stroke Mother   . Cancer Father   . Anesthesia problems Neg Hx   . Hypotension Neg Hx   . Malignant hyperthermia Neg Hx   . Pseudochol deficiency Neg Hx     SOCIAL HISTORY: Social History   Social History  . Marital status: Divorced    Spouse name: N/A  . Number of children: N/A  . Years of education: N/A   Occupational History  . Not on file.   Social History Main Topics  . Smoking status: Current Every Day Smoker    Packs/day: 0.50    Years: 40.00    Types: Cigarettes  . Smokeless tobacco: Never Used     Comment: Almost 1 pk per day  . Alcohol use No  . Drug use: No  . Sexual activity: No   Other Topics Concern  . Not on file   Social History Narrative  . No narrative on file    Allergies  Allergen Reactions  . Acetaminophen Other (See Comments)    Liver transplant recipient   . Codeine Itching  . Mirtazapine Other (See Comments)    hallucination  . Morphine  Itching and Other (See Comments)    hallucinate    Current Facility-Administered Medications  Medication Dose Route Frequency Provider Last Rate Last Dose  . acetaminophen (TYLENOL) tablet 650 mg  650 mg Oral Q4H PRN Radene Gunning, NP      . Derrill Memo ON 03/06/2016] calcitRIOL (ROCALTROL) capsule 0.25 mcg  0.25 mcg Oral Q T,Th,Sa-HD Ernest Haber, PA-C      . calcium acetate (PHOSLO) capsule 667 mg  667 mg Oral TID WC Radene Gunning, NP   667 mg at 03/05/16 1857  . [START ON 03/06/2016] Darbepoetin Alfa (ARANESP) injection 60 mcg  60 mcg Intravenous Q Thu-HD Ernest Haber, PA-C      . gabapentin (NEURONTIN) capsule 300 mg  300 mg Oral QPM Lezlie Octave Black, NP   300 mg at 03/05/16 1857  . heparin ADULT infusion 100 units/mL (25000 units/236mL sodium chloride 0.45%)  700 Units/hr Intravenous Continuous Waldemar Dickens, MD 7 mL/hr at 03/05/16 1501 700 Units/hr at 03/05/16 1501  . insulin aspart (novoLOG) injection 0-5 Units  0-5 Units Subcutaneous QHS Lezlie Octave Black, NP      . insulin aspart (novoLOG) injection 0-9 Units  0-9 Units Subcutaneous TID WC Radene Gunning, NP   1 Units at 03/05/16 1858  . insulin detemir (LEVEMIR) injection 5 Units  5 Units Subcutaneous QHS Radene Gunning, NP      . Derrill Memo ON 03/06/2016] levothyroxine (SYNTHROID, LEVOTHROID) tablet 175 mcg  175 mcg Oral QAC breakfast Radene Gunning, NP      . metoprolol tartrate (LOPRESSOR) tablet 50 mg  50 mg Oral TID Cheryln Manly, NP   50 mg at 03/05/16 1857  . morphine 4 MG/ML injection 2 mg  2 mg Intravenous Q2H PRN Radene Gunning, NP      . multivitamin (RENA-VIT) tablet 1 tablet  1 tablet Oral QHS Ernest Haber, PA-C      . ondansetron [facility]) injection 4 mg  4 mg Intravenous Q6H PRN Radene Gunning, NP   4 mg at 03/05/16 1453  . ondansetron (ZOFRAN-ODT) disintegrating tablet 4 mg  4 mg Oral Q8H PRN Radene Gunning, NP      .  oxyCODONE (Oxy IR/ROXICODONE) immediate release tablet 5 mg  5 mg Oral Q4H PRN Radene Gunning, NP   5 mg at 03/05/16 1945   . [START ON 03/06/2016] senna-docusate (Senokot-S) tablet 1 tablet  1 tablet Oral Daily Lezlie Octave Black, NP      . sodium chloride flush (NS) 0.9 % injection 3 mL  3 mL Intravenous Q12H Radene Gunning, NP   3 mL at 03/05/16 1445  . tacrolimus (PROGRAF) capsule 2 mg  2 mg Oral BID Radene Gunning, NP   Stopped at 03/05/16 1537    ROS:   General:  No weight loss, Fever, chills  HEENT: No recent headaches, no nasal bleeding, no visual changes, no sore throat  Neurologic: No dizziness, blackouts, seizures. No recent symptoms of stroke or mini- stroke. No recent episodes of slurred speech, or temporary blindness.  Cardiac: + recent episodes of chest pain/pressure, no shortness of breath at rest.  No shortness of breath with exertion. + history of atrial fibrillation or irregular heartbeat  Vascular: + history of rest pain in feet.  No history of claudication.  + history of non-healing ulcer, No history of DVT   Pulmonary: No home oxygen, no productive cough, no hemoptysis,  No asthma or wheezing  Musculoskeletal:  [ ]  Arthritis, [ ]  Low back pain,  [ ]  Joint pain  Hematologic:No history of hypercoagulable state.  No history of easy bleeding.  No history of anemia  Gastrointestinal: No hematochezia or melena,  No gastroesophageal reflux, no trouble swallowing  Urinary: [X]  chronic Kidney disease, [X]  on HD - [X]  MWF or [ ]  TTHS, [ ]  Burning with urination, [ ]  Frequent urination, [ ]  Difficulty urinating;   Skin: No rashes  Psychological: No history of anxiety,  No history of depression   Physical Examination  Vitals:   03/05/16 1600 03/05/16 1700 03/05/16 1856 03/05/16 1933  BP: 110/55  (!) 156/67 (!) 163/65  Pulse: 76  80 81  Resp: 13  13 15   Temp:    98.7 F (37.1 C)  TempSrc:    Oral  SpO2: 93%  98% 96%  Weight:  130 lb (59 kg)    Height:  5\' 4"  (1.626 m)      Body mass index is 22.31 kg/m.  General:  Alert and oriented, no acute distress HEENT: Normal Neck: No  JVD Pulmonary: Clear to auscultation bilaterally Cardiac: Regular Rate and Rhythm with systolic valve murmur Abdomen: Soft, non-tender, non-distended Skin: No rash, 1 cm diameter ulcer left heel with dry eschar tender to palpation Extremity Pulses:  2+ radial, brachial, femoral, absent dorsalis pedis, posterior tibial pulses bilaterally, pulsatile left thigh graft no erythema or tenderness Musculoskeletal: No deformity or edema  Neurologic: Upper and lower extremity motor 5/5 and symmetric  DATA:  I reviewed the patients angio from October good result in SFA with 3 vessel runoff Duplex today shows this is patent  CBC    Component Value Date/Time   WBC 17.2 (H) 03/05/2016 0927   RBC 3.10 (L) 03/05/2016 0927   HGB 9.8 (L) 03/05/2016 0927   HCT 30.1 (L) 03/05/2016 0927   PLT 158 03/05/2016 0927   MCV 97.1 03/05/2016 0927   MCH 31.6 03/05/2016 0927   MCHC 32.6 03/05/2016 0927   RDW 16.4 (H) 03/05/2016 0927   LYMPHSABS 0.6 (L) 08/14/2015 2240   MONOABS 0.3 08/14/2015 2240   EOSABS 0.1 08/14/2015 2240   BASOSABS 0.0 08/14/2015 2240    BMET  Component Value Date/Time   NA 134 (L) 03/05/2016 0927   K 5.2 (H) 03/05/2016 0927   CL 94 (L) 03/05/2016 0927   CO2 25 03/05/2016 0927   GLUCOSE 167 (H) 03/05/2016 0927   BUN 43 (H) 03/05/2016 0927   CREATININE 8.11 (H) 03/05/2016 0927   CREATININE 6.42 (H) 01/17/2016 1233   CALCIUM 8.3 (L) 03/05/2016 0927   GFRNONAA 5 (L) 03/05/2016 0927   GFRAA 5 (L) 03/05/2016 0927   INR 1.8 Troponin 1.4  ASSESSMENT:  Left foot pain with ulcer consistent with rest pain.  Leukocytosis doubt this is from her foot. ? Acute phase. Pt with liver transplant and immunosuppression so multiple possibilities. She has arterial circulation in her left leg as good as it is going to get so only other option to improve flow would be ligate left thigh av graft.  This would require a new access possibly right thigh graft and at least a catheter for a month.  She is  at risk of limb loss with her current situation but she may run into similar problems if the graft is moved to the right leg.   PLAN:  Will discuss with Dr Trula Slade but most likely if cardiac issues are resolved she will need duplex right leg to assess element of PAD on that side and then consideration for ligation of left thigh graft place right thigh graft and catheter.  All of this was discussed with pt and daughter.   Ruta Hinds, MD [facility] Office: [phone] Pager: [phone]

## 2016-03-05 NOTE — ED Provider Notes (Signed)
[place] DEPT Provider Note   CSN: [id number] Arrival date & time: 03/05/16  0746     History   Chief Complaint Chief Complaint  Patient presents with  . Leg Pain    HPI IDALIA CLASEN is a 61 y.o. female.  HPI   61 year old female with history of liver transplant, CAD, peripheral vascular disease, diabetes, neuromuscular disorder, COPD presenting complaining of leg pain.Patient report for the past 2-3 month she has had intermittent pain to her left lower extremities which has become progressively worsened in the past several days. She described pain as a sharp achy throbbing sensation, now persistent, worsening with ambulation but present at rest. Pain is now severe not adequately improved despite taking oxycodone pain medication previously prescribed by her primary care provider for the same symptoms. She also endorsed tingling sensation to the toes as well as coolness to her foot. She report prior DVT to the left leg diagnosed last October. She is currently on warfarin and states that her last INR was therapeutic at 2.3 which was obtained on Monday. She endorse chest pain but this is chronic and no associated shortness of breath. She is a Monday/Wednesday/Friday dialysis patient. She was at her dialysis session this morning but have to discontinue the session because the pain was intense and she was tachycardic. She reports history of SVT, usually improves with taking metoprolol however she did not take any metoprolol today. She has been compliant with her medication. No history of gout denies any recent injury and denies any back pain, bowel bladder incontinence or saddle anesthesia.  Past Medical History:  Diagnosis Date  . Anemia    takes Folic Acid daily  . Anxiety   . Aortic stenosis   . Arthritis    "left hand, back" (08/30/2012)  . Asthma   . CAD (coronary artery disease)   . CAD (coronary artery disease) Jan. 2015   Cath: 20% LAD, 50% D1; s/p LIMA-LAD  . Chronic back  pain   . Chronic bronchitis ([place])    "q yr w/season changes" (08/30/2012)  . Chronic constipation    takes MIralax and Colace daily  . COPD (chronic obstructive pulmonary disease) ([place])   . Depression    takes Cymbalta for "severe" depression  . End stage renal disease on dialysis [facility]) 02/27/2011   Kidneys shut down at time of liver transplant in Sept 2011 at [facility] in [place], she has been on HD ever since.  Dialyzes at [facility] HD on TTS schedule.  Had L forearm graft used 10 months then removed Dec 2012 due to suspected infection.  A right upper arm AVG was placed Dec 2012 but she developed steal symptoms acutely and it was ligated the same day.  Never had an AV fistula due to small veins.  Now has L thigh AVG put in Jan 2013, has not clotted to date.    [name] GERD (gastroesophageal reflux disease)    takes Omeprazole daily  . Headache    "at least monthly" (08/30/2012)  . Hepatitis C   . History of blood transfusion    "several" (08/30/2012)  . Hypertension    takes Metoprolol and Lisinopril daily, sees Dr Bea Graff  . Hypothyroidism    takes Synthroid daily  . Migraine    "last migraine was in 2013" (08/30/2012)  . Neuromuscular disorder ([place])    carpal tunnel in right hand  . Obesity   . Peripheral vascular disease (HCC) hands and legs  . Pneumonia    "  today and several times before" (08/30/2012)  . 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 SVT (supraventricular tachycardia) ([place]) 06/09/14  . Tobacco abuse   . Type II diabetes mellitus (HCC)    Levemir 2units daily if > 150    Patient Active Problem List   Diagnosis Date Noted  . Elevated LFTs   . Pressure injury of skin 11/27/2015  . Syncope 11/26/2015  . Cellulitis of foot 10/08/2015  . H/O unilateral nephrectomy 07/06/2015  . Paroxysmal SVT (supraventricular tachycardia) ([place]) 06/22/2014  .  Complications due to renal dialysis device, implant, and graft 05/11/2014  . Arterial hemorrhage 03/31/2014  . Acute blood loss anemia 03/31/2014  . ESRD on dialysis ([place]) 03/31/2014  . Renal dialysis device, implant, or graft complication [id number]  . Bleeding 03/30/2014  . Chest pain 03/19/2014  . Acute hyperkalemia   . Dyspnea 03/07/2014  . Pain in the chest   . S/P liver transplant ([place])   . Chronic hepatitis C without hepatic coma ([place])   . Diastolic CHF, chronic ([place])   . Thyroid activity decreased   . ESRD (end stage renal disease) ([place]) 01/03/2014  . Pulmonary edema 01/03/2014  . Hyperkalemia 01/03/2014  . Respiratory failure ([place]) 01/03/2014  . Evaluate for Sleep apnea 12/31/2013  . DM type 2 (diabetes mellitus, type 2) ([place]) 12/15/2013  . Unstable angina ([place]) 12/15/2013  . Pleural effusion 07/03/2013  . Paroxysmal supraventricular tachycardia, s/p valve replacement, with chest pain. resolved as outpatient 05/13/2013  . 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 Long term current use of anticoagulant therapy 04/22/2013  . Chronic anticoagulation w/ coumadin, goal INR 2.5-3.0 w/ AVR 04/14/2013  . S/P mechanical AVR Feb 2015 03/15/2013  . Aortic stenosis 03/09/2013  . Palpitations- run of tachycardia after HD, lasted 15-20 minutes 03/08/2013  . Tobacco abuse 02/28/2013  . COPD (chronic obstructive pulmonary disease) ([place]) 02/21/2013  . Hypoxia 02/21/2013  . Chronic combined systolic and diastolic congestive heart failure ([place]) 02/06/2013  . Acute respiratory failure ([place]) 11/02/2012  . Acute on chronic systolic CHF (congestive heart failure) ([place]) 11/02/2012  . COPD exacerbation ([place]) 08/31/2012  . Dyslipidemia-LDL 104, not on statin with Hx of liver transplant 07/30/2012  . Abn EKG- related to LVH, repol changes 07/30/2012  . CAD- s/p LIMA-LAD Feb. 2015 07/27/2012  . Aortic stenosis, moderate-severe at cath Jan 2015. s/p Mechanical AVR 03/2013 07/27/2012  . End stage  renal disease on dialysis-TTS 02/27/2011  . Acute hepatitis C virus infection 06/25/2006  . Hypothyroidism 06/25/2006  . DM2 (diabetes mellitus, type 2) ([place]) 06/25/2006  . ANEMIA, CHRONIC 06/25/2006  . Essential hypertension 06/25/2006  . GERD 06/25/2006    Past Surgical History:  Procedure Laterality Date  . AORTIC VALVE REPLACEMENT N/A 03/15/2013   AVR; Surgeon: Ivin Poot, MD;  Location: [facility] OR; Open Heart Surgery;  40mm[employer] mechanical prosthesis, top hat valve  . ARTERIOVENOUS GRAFT PLACEMENT Left 10/03/10    forearm  . AV FISTULA PLACEMENT  01/29/2011   Procedure: INSERTION OF ARTERIOVENOUS (AV) GORE-TEX GRAFT ARM;  Surgeon: Elam Dutch, MD;  Location: [facility] OR;  Service: Vascular;  Laterality: Right;  . AV FISTULA PLACEMENT  03/10/2011   Procedure: INSERTION OF ARTERIOVENOUS (AV) GORE-TEX GRAFT THIGH;  Surgeon: Elam Dutch, MD;  Location: [place];  Service: Vascular;  Laterality: Left;  . [place] REMOVAL  12/23/2010   Procedure: REMOVAL OF ARTERIOVENOUS GORETEX GRAFT ([place]);  Surgeon: Elam Dutch, MD;  Location: [place];  Service: Vascular;  Laterality: Left;  procedure started @1736 -1852  . CHOLECYSTECTOMY  1993  . CORONARY ARTERY BYPASS GRAFT N/A 03/15/2013   Procedure: CORONARY ARTERY BYPASS GRAFTING (CABG) times one using left internal mammary artery.;  Surgeon: Ivin Poot, MD;  Location: [place];  Service: Open Heart Surgery;  Laterality: N/A;  POSS CABG X 1  . CYSTOSCOPY  1990's  . INSERTION OF DIALYSIS CATHETER  12/23/2010   Procedure: INSERTION OF DIALYSIS CATHETER;  Surgeon: Elam Dutch, MD;  Location: [place];  Service: Vascular;  Laterality: Right;  Right Internal Jugular 28cm dialysis catheter insertion procedure time 1701-1720   . INTRAOPERATIVE TRANSESOPHAGEAL ECHOCARDIOGRAM N/A 03/15/2013   Procedure: INTRAOPERATIVE TRANSESOPHAGEAL ECHOCARDIOGRAM;  Surgeon: Ivin Poot, MD;  Location: [place];  Service: Open Heart Surgery;  Laterality: N/A;  . LEFT  HEART CATHETERIZATION WITH CORONARY ANGIOGRAM N/A 07/29/2012   Procedure: LEFT HEART CATHETERIZATION WITH CORONARY ANGIOGRAM;  Surgeon: Troy Sine, MD;  Location: [facility] CATH LAB;  Service: Cardiovascular;  Laterality: N/A;  . LEFT HEART CATHETERIZATION WITH CORONARY ANGIOGRAM N/A 03/10/2013   Procedure: LEFT HEART CATHETERIZATION WITH CORONARY ANGIOGRAM;  Surgeon: Troy Sine, MD;  Location: [facility] CATH LAB;  Service: Cardiovascular;  Laterality: N/A;  . LEFT HEART CATHETERIZATION WITH CORONARY/GRAFT ANGIOGRAM N/A 12/24/2011   Procedure: LEFT HEART CATHETERIZATION WITH Beatrix Fetters;  Surgeon: [name], MD;  Location: [facility] CATH LAB;  Service: Cardiovascular;  Laterality: N/A;  . LEFT HEART CATHETERIZATION WITH CORONARY/GRAFT ANGIOGRAM N/A 12/16/2013   Procedure: LEFT HEART CATHETERIZATION WITH Beatrix Fetters;  Surgeon: Troy Sine, MD;  Location: [facility] CATH LAB;  Service: Cardiovascular;  Laterality: N/A;  . LIVER TRANSPLANT  10/25/2009   sees Dr Ferol Luz 1 every 6 months, saw last in Dec 2013. [name] Coord [phone]  . PERIPHERAL VASCULAR CATHETERIZATION N/A 11/06/2015   Procedure: Abdominal Aortogram;  Surgeon: Serafina Mitchell, MD;  Location: [place] CV LAB;  Service: Cardiovascular;  Laterality: N/A;  . PERIPHERAL VASCULAR CATHETERIZATION N/A 11/06/2015   Procedure: Lower Extremity Angiography;  Surgeon: Serafina Mitchell, MD;  Location: [place] CV LAB;  Service: Cardiovascular;  Laterality: N/A;  . PERIPHERAL VASCULAR CATHETERIZATION  11/06/2015   Procedure: Peripheral Vascular Atherectomy;  Surgeon: Serafina Mitchell, MD;  Location: [place] CV LAB;  Service: Cardiovascular;;  Left Superficial femoral  . SHUNTOGRAM Left 05/15/2014   Procedure: SHUNTOGRAM;  Surgeon: Conrad Bucks, MD;  Location: [facility] CATH LAB;  Service: Cardiovascular;  Laterality: Left;  . SMALL INTESTINE SURGERY  90's  . SPINAL GROWTH RODS  2010   "put 2 metal rods in my back; they had  detetriorated" (08/30/2012)  . THROMBECTOMY    . THROMBECTOMY AND REVISION OF ARTERIOVENTOUS (AV) GORETEX  GRAFT Left 03/30/2014  . THROMBECTOMY AND REVISION OF ARTERIOVENTOUS (AV) GORETEX  GRAFT Left 03/30/2014   Procedure: THROMBECTOMY AND REVISION OF ARTERIOVENTOUS (AV) GORETEX  GRAFT;  Surgeon: Conrad [place]ville, MD;  Location: [place];  Service: Vascular;  Laterality: Left;  . TUBAL LIGATION  1990's    OB History    No data available       Home Medications    Prior to Admission medications   Medication Sig Start Date End Date Taking? Authorizing Provider  aspirin EC 81 MG tablet Take 81 mg by mouth daily.  Historical Provider, MD  calcium acetate (PHOSLO) 667 MG capsule Take 1 capsule (667 mg total) by mouth 3 (three) times daily with meals. 10/16/15   Orson Eva, MD  enoxaparin (LOVENOX) 40 MG/0.4ML injection Inject 0.4 mLs (40 mg total) into the skin daily. 12/28/15   Troy Sine, MD  gabapentin (NEURONTIN) 300 MG capsule Take 300 mg by mouth every evening.    Historical Provider, MD  levothyroxine (SYNTHROID, LEVOTHROID) 175 MCG tablet Take 1 tablet (175 mcg total) by mouth daily before breakfast. 02/15/16   Troy Sine, MD  metoprolol tartrate (LOPRESSOR) 25 MG tablet Take 25 mg by mouth 2 (two) times daily. 03/16/14   Historical Provider, MD  nitroGLYCERIN (NITROSTAT) 0.4 MG SL tablet Place 1 tablet (0.4 mg total) under the tongue every 5 (five) minutes as needed for chest pain. 03/20/14   Silver Huguenin Elgergawy, MD  ondansetron (ZOFRAN ODT) 4 MG disintegrating tablet Take 1 tablet (4 mg total) by mouth every 8 (eight) hours as needed for nausea. 08/13/15   Tanna Furry, MD  senna-docusate (SENOKOT-S) 8.6-50 MG tablet Take 1 tablet by mouth daily.    Historical Provider, MD  tacrolimus (PROGRAF) 1 MG capsule Take 2 capsules (2 mg total) by mouth 2 (two) times daily. Take 2 capsules twice daily. 11/29/15   Maryann Mikhail, DO  warfarin (COUMADIN) 2 MG tablet Take 1- 2 tablets by mouth daily or as  directed by coumadin clinic 11/01/15   Troy Sine, MD    Family History Family History  Problem Relation Age of Onset  . Cancer Mother   . Diabetes Mother   . Hypertension Mother   . Stroke Mother   . Cancer Father   . Anesthesia problems Neg Hx   . Hypotension Neg Hx   . Malignant hyperthermia Neg Hx   . Pseudochol deficiency Neg Hx     Social History Social History  Substance Use Topics  . Smoking status: Current Every Day Smoker    Packs/day: 1.00    Years: 40.00    Types: Cigarettes  . Smokeless tobacco: Never Used     Comment: Almost 1 pk per day  . Alcohol use No     Allergies   Acetaminophen; Codeine; Mirtazapine; and Morphine   Review of Systems Review of Systems  All other systems reviewed and are negative.    Physical Exam Updated Vital Signs SpO2 100%   Physical Exam  Constitutional:  Chronically ill-appearing female, moaning, appears to be in mild discomfort.  HENT:  Head: Atraumatic.  Eyes: Conjunctivae are normal.  Neck: Neck supple.  Cardiovascular:  Tachycardia without murmurs rubs or gallops  Pulmonary/Chest:  Faint crackles but no wheezes or rhonchi  Abdominal: Soft. There is no tenderness.  Musculoskeletal:  Left lower extremity: Diffuse tenderness throughout left lower calf, ankle and foot with gentle palpation. No significant erythema or warmth or edema noted. No palpable cord. Diminished dorsalis pedal pulse but dopplerable. The foot is darker in skin color than the rest of the skin.  Pressure ulcer to heel (chronic), ulcer to ventral aspect of great toe, ulcer to lateral malleolar region.   RLE nontender, DP is diminished but dopplerable.   Neurological: She is alert.  Skin: No rash noted.  Psychiatric: She has a normal mood and affect.  Nursing note and vitals reviewed.    ED Treatments / Results  Labs (all labs ordered are listed, but only abnormal results are displayed) Labs Reviewed  BASIC METABOLIC PANEL - Abnormal;  Notable for the following:       Result Value   Sodium 134 (*)    Potassium 5.2 (*)    Chloride 94 (*)    Glucose, Bld 167 (*)    BUN 43 (*)    Creatinine, Ser 8.11 (*)    Calcium 8.3 (*)    GFR calc non Af Amer 5 (*)    GFR calc Af Amer 5 (*)    All other components within normal limits  CBC - Abnormal; Notable for the following:    WBC 17.2 (*)    RBC 3.10 (*)    Hemoglobin 9.8 (*)    HCT 30.1 (*)    RDW 16.4 (*)    All other components within normal limits  PROTIME-INR - Abnormal; Notable for the following:    Prothrombin Time 21.2 (*)    All other components within normal limits  I-STAT TROPOININ, ED - Abnormal; Notable for the following:    Troponin i, poc 0.65 (*)    All other components within normal limits  TROPONIN I  TROPONIN I  HEMOGLOBIN A1C    EKG  EKG Interpretation  Date/Time:  Wednesday March 05 2016 07:59:00 EST Ventricular Rate:  143 PR Interval:    QRS Duration: 89 QT Interval:  329 QTC Calculation: 508 R Axis:   -52 Text Interpretation:  Sinus tachycardia Ventricular premature complex Left anterior fascicular block Probable anteroseptal infarct, recent Lateral leads are also involved Prolonged QT interval When compared with ECG of 11/26/2015, HEART RATE has increased Confirmed by [facility]  MD, DAVID ([id number]) on 03/05/2016 8:10:31 AM Also confirmed by Roxanne Mins  MD, DAVID ([id number]), editor [place], [place], [place] [id number])  on 03/05/2016 8:45:39 AM       Radiology Dg Chest 2 View  Result Date: 03/05/2016 CLINICAL DATA:  Diabetes, coronary artery disease, liver transplant patient, history of asthma. No reported chest complaints. The patient is complaining of foot pain. EXAM: CHEST  2 VIEW COMPARISON:  PA and lateral chest x-ray of November 26, 2015 FINDINGS: The lungs are adequately inflated. There is a trace of pleural fluid at the left lung base. The interstitial markings are mildly prominent though stable. The heart is top-normal in size. There is a prosthetic  aortic valve in place. The sternal wires are intact. The pulmonary vascularity is not engorged. There is calcification in the wall of the aortic arch. The bony thorax exhibits no acute abnormality. IMPRESSION: Small left pleural effusion. Mild interstitial prominence which is not entirely new. No alveolar pneumonia nor CHF. Thoracic aortic atherosclerosis. Electronically Signed   By: David  [place]ique M.D.   On: 03/05/2016 08:50    Procedures Procedures (including critical care time)  VASCULAR LAB PRELIMINARY  ARTERIAL  ABI completed:Bilateral ABI's are non-compressible bilaterally with monophasic waveforms.TBI's were not able to be obtained due to irregular waveforms.    RIGHT    LEFT    PRESSURE WAVEFORM  PRESSURE WAVEFORM  BRACHIAL 103 Biphasic BRACHIAL 100 Biphasic  DP >254 Mono DP >254 Mono  PT 192 Mono PT >254 Mono  GREAT TOE  NA GREAT TOE  NA    RIGHT LEFT  ABI [place]     Charlotte C Bynum, RVT 03/05/2016, 11:55 AM  Everrett Coombe, RVT    [] Hide copied text [] Hover for attribution information *PRELIMINARY RESULTS* Vascular Ultrasound Bilateral lower extremity venous duplex has been completed.  Preliminary findings: No evidence of deep vein thrombosis in the visualized veins of the lower extremities.  Chronic appearing  superficial vein thrombosis in the greater saphenous veins bilaterally. Negative for baker's cysts bilaterally. Incidental: Severe atherosclerotic changes bilaterally lower extremities.   Everrett Coombe 03/05/2016, 10:52 AM       Medications Ordered in ED Medications  metoprolol tartrate (LOPRESSOR) tablet 50 mg (not administered)  oxyCODONE (Oxy IR/ROXICODONE) immediate release tablet 5 mg (not administered)  levothyroxine (SYNTHROID, LEVOTHROID) tablet 175 mcg (not administered)  tacrolimus (PROGRAF) capsule 2 mg (not administered)  calcium acetate (PHOSLO) capsule 667 mg (not administered)  gabapentin (NEURONTIN) capsule  300 mg (not administered)  ondansetron (ZOFRAN-ODT) disintegrating tablet 4 mg (not administered)  senna-docusate (Senokot-S) tablet 1 tablet (not administered)  acetaminophen (TYLENOL) tablet 650 mg (not administered)  ondansetron (ZOFRAN) injection 4 mg (not administered)  insulin detemir (LEVEMIR) injection 5 Units (not administered)  insulin aspart (novoLOG) injection 0-9 Units (not administered)  insulin aspart (novoLOG) injection 0-5 Units (not administered)  morphine 4 MG/ML injection 2 mg (not administered)  HYDROmorphone (DILAUDID) injection 1 mg (1 mg Intravenous Given 03/05/16 0916)  metoprolol (LOPRESSOR) injection 5 mg (5 mg Intravenous Given 03/05/16 0918)  aspirin chewable tablet 324 mg (324 mg Oral Given 03/05/16 1205)  HYDROmorphone (DILAUDID) injection 1 mg (1 mg Intravenous Given 03/05/16 1202)     Initial Impression / Assessment and Plan / ED Course  I have reviewed the triage vital signs and the nursing notes.  Pertinent labs & imaging results that were available during my care of the patient were reviewed by me and considered in my medical decision making (see chart for details).     BP 108/69   Pulse (!) 132   Temp 98.5 F (36.9 C) (Oral)   Resp 12   Ht 5' 4.5" (1.638 m)   Wt 59 kg   SpO2 97%   BMI 21.97 kg/m    Final Clinical Impressions(s) / ED Diagnoses   Final diagnoses:  Claudication of left lower extremity (HCC)  Elevated troponin I level  Chest pain due to myocardial ischemia, unspecified ischemic chest pain type [facility])  Dialysis patient [facility])    New Prescriptions New Prescriptions   No medications on file   9:13 AM Patient here with acute on chronic left lower extremity pain suggestive of claudication when considering her multiple comorbidities. She did have prior DVT involving her left lower extremities and currently on warfarin. My plan is to obtain ABI, venous Doppler, to assess for either acute on chronic DVT or arterial insufficiency.  Pain medication provided. Workup initiated. Patient is tachycardic, EKG showing sinus tach at 143. This is likely due to her SVT and pain. She is currently on metoprolol, I will provide her blood pressure medication to help regulate her rate. Care discussed with Dr. Vanita Panda.  9:47 AM Pt has been seen and evaluated by both [facility] as well as [facility] (Dr. Dorthula Rue).  Last visit was 02/11/2016.  Pt has significant peripheral arterial occlusive disease with severe forefoot disease.  She has hx of multilevel occlusive disease bilaterally, and has 2 small superficials ulcers in the left foot which she is currently being treated by wound care.  She underwent endovascular revascularization with improvement down to her ankle, but because she has severe forefoot disease it was thought that there are not much else that vascular surgeon can do at this time.  Pt has AV graft of her left thigh, which may be stealing some of the arterial flow, but she is not a good candidate for change in her  vascular site.  She will need to be care for by pain management.  She continues to use tobacco which I have encourage tobacco cessation.    12:13 PM Patient has an elevated troponin of 0.65. This is likely demand ischemia, worsening with end-stage renal disease. Her labs remarkable for potassium of 5.2 and evidence of end-stage renal disease. Patient missed her dialysis today. She also has an elevated white count of 17.2 which could be reactive. Her INR is subtherapeutic at 1.81. Chest x-ray shows small left pleural effusion but no pneumonia or CHF.  Patient was initially tachycardic with heart rate in the 140s, mildly improved after taking metoprolol given in the ED. Heart rates in the 130s. I did discuss this with Dr. Vanita Panda. Her blood pressure is stable therefore no additional treatment given. Patient initially has mild left-sided chest discomfort, resolved after receiving pain  medication. When asked if she has any active chest pain currently, patient now reports having 5 out of 10 pain to the left chest. Pain is chronic in nature. Will consult cardiology for further recommendation. Her EKG is showing sinus tachycardia  12:24 PM I have consulted with on call nephrologist Dr. Melvia Heaps, who acknowledge that pt will need dialysis. He felt she may not need dialysis today in light of her current medical problem.  He will evaluate pt.  I will also consult cardiology, vascular surgery, as well as medicine for admission.    Cardiology have been consulted and will see pt in the ER for further evaluation of her elevated troponin, tachycardia, and chest pain.  Pt received ASA and additional pain medication.   12:38 PM Appreciate consultation from vascular surgeon Dr. Oneida Alar who agrees to review pt's chart and will be available for consultation.  Will consult medicine for admission.    2:09 PM I have consulted [facility], Dyanne Carrel, NP who agrees to see pt in the ER.  Pt to be admitted to telemetry floor, observation status under attending Dr. Marily Memos.    CRITICAL CARE Performed by: Domenic Moras Total critical care time: 45 minutes Critical care time was exclusive of separately billable procedures and treating other patients. Critical care was necessary to treat or prevent imminent or life-threatening deterioration. Critical care was time spent personally by me on the following activities: development of treatment plan with patient and/or surrogate as well as nursing, discussions with consultants, evaluation of patient's response to treatment, examination of patient, obtaining history from patient or surrogate, ordering and performing treatments and interventions, ordering and review of laboratory studies, ordering and review of radiographic studies, pulse oximetry and re-evaluation of patient's condition.      Domenic Moras, PA-C 03/05/16 [place],  MD 03/05/16 [phone]

## 2016-03-05 NOTE — Progress Notes (Signed)
VASCULAR LAB PRELIMINARY  ARTERIAL  ABI completed:Bilateral ABI's are non-compressible bilaterally with monophasic waveforms.TBI's were not able to be obtained due to irregular waveforms.    RIGHT    LEFT    PRESSURE WAVEFORM  PRESSURE WAVEFORM  BRACHIAL 103 Biphasic BRACHIAL 100 Biphasic  DP >254 Mono DP >254 Mono  PT 192 Mono PT >254 Mono  GREAT TOE  NA GREAT TOE  NA    RIGHT LEFT  ABI [name]     [name], RVT 03/05/2016, 11:55 AM

## 2016-03-05 NOTE — Consult Note (Signed)
[facility] Renal Consultation Note  Indication for Consultation:  Management of ESRD/hemodialysis; anemia, hypertension/volume and secondary hyperparathyroidism  HPI: [name] is a 61 y.o. female. ESRD sec DM ( chronic HD [facility] mwf)  last TX on 03/03/16 sent to ER  From kidney center today before any hd started =sec Tachycardia / L foot pain and chest pain . Admitted with A. Flutter ( had Vrate 130 on presentation 0 iv lopressor given and now in 34s. On IV heaprin  Drip with Cardiology consulted and seeing  pt. She reports progressive Left foot pain past several days(awaiting Pain clinic op eval )has known ho PAD (VVS follows) Doppler studies done ( no dvt )Her Left Femoral AVGG may be playing role in pain with PAD HO. Troponin pos( demand ischemia per Card . ) Now  Chest pain resolving in ER with pain meds and HR to normal  . K = 5.2 and cxr =no pul edema. Also noted ho hep c with Liver failure -Transplanted CH 2011/ Dm chronic back pain, AVR and CABG x 03/15/13 [name]  hosp [facility] 04/2015 perirenal bleed rq embolization- had to continue coumadin due to mechanical heart valve / hosp [facility] in may 2017 w/ resection of kidney for bleeding, s/p AV replacement, metal, on anticoagulation --[facility] 7/13-7/26/17 Spleenectomy sec Infarct.       Past Medical History:  Diagnosis Date  . Anemia    takes Folic Acid daily  . Anxiety   . Aortic stenosis   . Arthritis    "left hand, back" (08/30/2012)  . Asthma   . CAD (coronary artery disease)   . CAD (coronary artery disease) Jan. 2015   Cath: 20% LAD, 50% D1; s/p LIMA-LAD  . Chronic back pain   . Chronic bronchitis ([facility])    "q yr w/season changes" (08/30/2012)  . Chronic constipation    takes MIralax and Colace daily  . COPD (chronic obstructive pulmonary disease) ([place])   . Depression    takes Cymbalta for "severe" depression  . End stage renal disease on dialysis [facility]) 02/27/2011   Kidneys shut down at time of liver transplant in  Sept 2011 at [facility] in [place], she has been on HD ever since.  Dialyzes at [facility] on TTS schedule.  Had L forearm graft used 10 months then removed Dec 2012 due to suspected infection.  A right upper arm AVG was placed Dec 2012 but she developed steal symptoms acutely and it was ligated the same day.  Never had an AV fistula due to small veins.  Now has L thigh AVG put in Jan 2013, has not clotted to date.    [name] GERD (gastroesophageal reflux disease)    takes Omeprazole daily  . Headache    "at least monthly" (08/30/2012)  . Hepatitis C   . History of blood transfusion    "several" (08/30/2012)  . Hypertension    takes Metoprolol and Lisinopril daily, sees Dr Bea Graff  . Hypothyroidism    takes Synthroid daily  . Migraine    "last migraine was in 2013" (08/30/2012)  . Neuromuscular disorder ([place])    carpal tunnel in right hand  . Obesity   . Peripheral vascular disease (HCC) hands and legs  . Pneumonia    "today and several times before" (08/30/2012)  . 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 SVT (supraventricular tachycardia) ([place]) 06/09/14  . Tobacco abuse   . Type II diabetes mellitus (HCC)    Levemir 2units daily if > 150    Past Surgical History:  Procedure Laterality Date  . AORTIC VALVE REPLACEMENT N/A 03/15/2013   AVR; Surgeon: Ivin Poot, MD;  Location: [facility] OR; Open Heart Surgery;  59m[employer] mechanical prosthesis, top hat valve  . ARTERIOVENOUS GRAFT PLACEMENT Left 10/03/10    forearm  . AV FISTULA PLACEMENT  01/29/2011   Procedure: INSERTION OF ARTERIOVENOUS (AV) GORE-TEX GRAFT ARM;  Surgeon: CElam Dutch MD;  [facility];  Service: Vascular;  Laterality: Right;  . AV FISTULA PLACEMENT  03/10/2011   Procedure: INSERTION OF ARTERIOVENOUS (AV) GORE-TEX GRAFT THIGH;  Surgeon: CElam Dutch MD;  [place]   Service: Vascular;  Laterality: Left;  . AEvertonREMOVAL  12/23/2010   Procedure: REMOVAL OF ARTERIOVENOUS GORETEX GRAFT (AB[place];  Surgeon: CElam Dutch MD;  [place]  Service: Vascular;  Laterality: Left;  procedure started @1736 -1852  . CHOLECYSTECTOMY  1993  . CORONARY ARTERY BYPASS GRAFT N/A 03/15/2013   Procedure: CORONARY ARTERY BYPASS GRAFTING (CABG) times one using left internal mammary artery.;  Surgeon: PIvin Poot MD;  [place]  Service: Open Heart Surgery;  Laterality: N/A;  POSS CABG X 1  . CYSTOSCOPY  1990's  . INSERTION OF DIALYSIS CATHETER  12/23/2010   Procedure: INSERTION OF DIALYSIS CATHETER;  Surgeon: CElam Dutch MD;  [place]  Service: Vascular;  Laterality: Right;  Right Internal Jugular 28cm dialysis catheter insertion procedure time 1701-1720   . INTRAOPERATIVE TRANSESOPHAGEAL ECHOCARDIOGRAM N/A 03/15/2013   Procedure: INTRAOPERATIVE TRANSESOPHAGEAL ECHOCARDIOGRAM;  Surgeon: PIvin Poot MD;  [place]  Service: Open Heart Surgery;  Laterality: N/A;  . LEFT HEART CATHETERIZATION WITH CORONARY ANGIOGRAM N/A 07/29/2012   Procedure: LEFT HEART CATHETERIZATION WITH CORONARY ANGIOGRAM;  Surgeon: TTroy Sine MD;  [facility];  Service: Cardiovascular;  Laterality: N/A;  . LEFT HEART CATHETERIZATION WITH CORONARY ANGIOGRAM N/A 03/10/2013   Procedure: LEFT HEART CATHETERIZATION WITH CORONARY ANGIOGRAM;  Surgeon: TTroy Sine MD;  [facility];  Service: Cardiovascular;  Laterality: N/A;  . LEFT HEART CATHETERIZATION WITH CORONARY/GRAFT ANGIOGRAM N/A 12/24/2011   Procedure: LEFT HEART CATHETERIZATION WITH CBeatrix Fetters  Surgeon: JLorretta Harp MD;  [facility];  Service: Cardiovascular;  Laterality: N/A;  . LEFT HEART CATHETERIZATION WITH CORONARY/GRAFT ANGIOGRAM N/A 12/16/2013   Procedure: LEFT HEART CATHETERIZATION WITH CBeatrix Fetters  Surgeon: TTroy Sine MD;  [facility];  Service: Cardiovascular;  Laterality: N/A;  . LIVER TRANSPLANT  10/25/2009   sees Dr TFerol Luz1 every 6 months, saw last in Dec 2013. DDelynn FlavinCoord [phone] . PERIPHERAL VASCULAR CATHETERIZATION N/A 11/06/2015   Procedure: Abdominal Aortogram;  Surgeon: VSerafina Mitchell MD;  [facility];  Service: Cardiovascular;  Laterality: N/A;  . PERIPHERAL VASCULAR CATHETERIZATION N/A 11/06/2015   Procedure: Lower Extremity Angiography;  Surgeon: VSerafina Mitchell MD;  [place] [facility];  Service: Cardiovascular;  Laterality: N/A;  . PERIPHERAL VASCULAR CATHETERIZATION  11/06/2015   Procedure: Peripheral Vascular Atherectomy;  Surgeon: VSerafina Mitchell MD;  [place];  Service: Cardiovascular;;  Left Superficial femoral  . SHUNTOGRAM Left 05/15/2014   Procedure: SHUNTOGRAM;  Surgeon: BConrad Mandeville MD;  [facility];  Service: Cardiovascular;  Laterality: Left;  . SMALL INTESTINE SURGERY  90's  . SPINAL GROWTH RODS  2010   "put 2 metal rods in my back; they had detetriorated" (08/30/2012)  . THROMBECTOMY    . THROMBECTOMY AND REVISION OF ARTERIOVENTOUS (AV) GORETEX  GRAFT Left 03/30/2014  . THROMBECTOMY AND REVISION OF ARTERIOVENTOUS (AV) GORETEX  GRAFT Left 03/30/2014   Procedure: THROMBECTOMY AND REVISION OF ARTERIOVENTOUS (AV) GORETEX  GRAFT;  Surgeon: Conrad Antelope, MD;  Location: [place];  Service: Vascular;  Laterality: Left;  . TUBAL LIGATION  1990's      Family History  Problem Relation Age of Onset  . Cancer Mother   . Diabetes Mother   . Hypertension Mother   . Stroke Mother   . Cancer Father   . Anesthesia problems Neg Hx   . Hypotension Neg Hx   . Malignant hyperthermia Neg Hx   . Pseudochol deficiency Neg Hx       reports that she has been smoking Cigarettes.  She has a 20.00 pack-year smoking history. She has never used smokeless tobacco. She reports that she does not drink alcohol or use drugs.   Allergies   Allergen Reactions  . Acetaminophen Other (See Comments)    Liver transplant recipient   . Codeine Itching  . Mirtazapine Other (See Comments)    hallucination  . Morphine Itching and Other (See Comments)    hallucinate    Prior to Admission medications   Medication Sig Start Date End Date Taking? Authorizing Provider  aspirin EC 81 MG tablet Take 81 mg by mouth daily.   Yes Historical Provider, MD  calcium acetate (PHOSLO) 667 MG capsule Take 1 capsule (667 mg total) by mouth 3 (three) times daily with meals. 10/16/15  Yes Orson Eva, MD  gabapentin (NEURONTIN) 300 MG capsule Take 300 mg by mouth every evening.   Yes Historical Provider, MD  Insulin Detemir (LEVEMIR FLEXPEN) 100 UNIT/ML Pen Inject 2-6 Units into the skin See admin instructions. Only if cbg is over 200 12/27/15  Yes Historical Provider, MD  levothyroxine (SYNTHROID, LEVOTHROID) 175 MCG tablet Take 1 tablet (175 mcg total) by mouth daily before breakfast. 02/15/16  Yes Troy Sine, MD  metoprolol tartrate (LOPRESSOR) 25 MG tablet Take 25 mg by mouth 2 (two) times daily. 03/16/14  Yes Historical Provider, MD  ondansetron (ZOFRAN ODT) 4 MG disintegrating tablet Take 1 tablet (4 mg total) by mouth every 8 (eight) hours as needed for nausea. 08/13/15  Yes Tanna Furry, MD  oxyCODONE (OXY IR/ROXICODONE) 5 MG immediate release tablet Take 5 mg by mouth every 4 (four) hours as needed. 03/03/16  Yes Historical Provider, MD  senna-docusate (SENOKOT-S) 8.6-50 MG tablet Take 1 tablet by mouth daily.   Yes Historical Provider, MD  tacrolimus (PROGRAF) 1 MG capsule Take 2 capsules (2 mg total) by mouth 2 (two) times daily. Take 2 capsules twice daily. 11/29/15  Yes Maryann Mikhail, DO  warfarin (COUMADIN) 2 MG tablet Take 1- 2 tablets by mouth daily or as directed by coumadin clinic Patient taking differently: Take 2 mg by mouth daily. Take 1- 2 tablets by mouth daily or as directed by coumadin clinic 11/01/15  Yes Troy Sine, MD  enoxaparin  (LOVENOX) 40 MG/0.4ML injection Inject 0.4 mLs (40 mg total) into the skin daily. Patient not taking: Reported on 03/05/2016 12/28/15   Troy Sine, MD  nitroGLYCERIN (NITROSTAT) 0.4 MG SL tablet Place 1 tablet (0.4 mg total) under the tongue every 5 (five)  minutes as needed for chest pain. 03/20/14   Albertine Patricia, MD     Anti-infectives    None      Results for orders placed or performed during the hospital encounter of 03/05/16 (from the past 48 hour(s))  Basic metabolic panel     Status: Abnormal   Collection Time: 03/05/16  9:27 AM  Result Value Ref Range   Sodium 134 (L) 135 - 145 mmol/L   Potassium 5.2 (H) 3.5 - 5.1 mmol/L   Chloride 94 (L) 101 - 111 mmol/L   CO2 25 22 - 32 mmol/L   Glucose, Bld 167 (H) 65 - 99 mg/dL   BUN 43 (H) 6 - 20 mg/dL   Creatinine, Ser 8.11 (H) 0.44 - 1.00 mg/dL   Calcium 8.3 (L) 8.9 - 10.3 mg/dL   GFR calc non Af Amer 5 (L) >60 mL/min   GFR calc Af Amer 5 (L) >60 mL/min    Comment: (NOTE) The eGFR has been calculated using the CKD EPI equation. This calculation has not been validated in all clinical situations. eGFR's persistently <60 mL/min signify possible Chronic Kidney Disease.    Anion gap 15 5 - 15  CBC     Status: Abnormal   Collection Time: 03/05/16  9:27 AM  Result Value Ref Range   WBC 17.2 (H) 4.0 - 10.5 K/uL   RBC 3.10 (L) 3.87 - 5.11 MIL/uL   Hemoglobin 9.8 (L) 12.0 - 15.0 g/dL   HCT 30.1 (L) 36.0 - 46.0 %   MCV 97.1 78.0 - 100.0 fL   MCH 31.6 26.0 - 34.0 pg   MCHC 32.6 30.0 - 36.0 g/dL   RDW 16.4 (H) 11.5 - 15.5 %   Platelets 158 150 - 400 K/uL  Protime-INR     Status: Abnormal   Collection Time: 03/05/16  9:27 AM  Result Value Ref Range   Prothrombin Time 21.2 (H) 11.4 - 15.2 seconds   INR 1.81   I-stat troponin, ED     Status: Abnormal   Collection Time: 03/05/16  9:42 AM  Result Value Ref Range   Troponin i, poc 0.65 (HH) 0.00 - 0.08 ng/mL   Comment NOTIFIED PHYSICIAN    Comment 3            Comment: Due to  the release kinetics of cTnI, a negative result within the first hours of the onset of symptoms does not rule out myocardial infarction with certainty. If myocardial infarction is still suspected, repeat the test at appropriate intervals.     ROS:  See hpi   Physical Exam: Vitals:   03/05/16 0930 03/05/16 1115  BP:  126/91  Pulse: (!) 136 (!) 137  Resp: 14 13  Temp:       General: alert / chronically ill AAF / NAD / OX3 HEENT: [place] / EOMI/ Nonicteric  Neck: supple , no jvd Heart: Irre, Irreg Vent rate 80s on tel. mech clinking / no rub or gal.  Lungs: cta  Abdomen: bs pos soft nt ,nd Extremities: L trace pedal edema Skin: Bilat Toes Ischemic appearance L>R / L heal dried ulcer/  Neuro: alert / moves all extrem . No overt focal deficits  Dialysis Access: Pos bruit L FEM AVGG  Dialysis Orders: Center: [facility]   on MWF . EDW 59.5  HD [facility]   Time 3hrs 24mn/ Heparin no/ . Access L fem AVGG     Po calcitriol 0.284m /HD  MIrcera 60 mcg  q 2wks   *  was to start 03/05/16" Other op labs hgb 9.8  Ca 9.3 phos 8.7  PTH 1294  Assessment/Plan 1. ESRD =  schedule MWF ,will hold till tomor  With pos tropn.  2. A. Flutter with RVR= resolved with B blk -card following /anticoags/ 3. PAD/ L lower extrem pain= per admit / VVS  To see 4. Chest Pain / CAD ho / AOR - card consulted "demand ischemia " 5. Hypertension/volume  -bp stable and no excess vol on exam  6. Anemia  - hgb 9.8  Start ESA with HD  7. Metabolic bone disease -  Noted OP Phos and PTH ^ with complaince / continue Vit d  And  phoslo  As binder 8. HO Liver transplant sec Hep c ( blood transfusion induced) - on meds 9. DM  Type 2 - per admit  10. copd  Ernest Haber, PA-C [place] [phone] 03/05/2016, 12:39 PM   Pt seen, examined and agree w A/P as above. ESRD pt with chest pain and left leg pain presenting to ER , has aflutter w RVR, BP's ok.  No pulm edema.  +trop, EKG. Plan HD tomorrow.  [name]  MD [name] pager [phone]   03/06/2016, 11:47 AM

## 2016-03-05 NOTE — Progress Notes (Signed)
*  PRELIMINARY RESULTS* Vascular Ultrasound Bilateral lower extremity venous duplex has been completed.  Preliminary findings: No evidence of deep vein thrombosis in the visualized veins of the lower extremities.  Chronic appearing superficial vein thrombosis in the greater saphenous veins bilaterally. Negative for baker's cysts bilaterally. Incidental: Severe atherosclerotic changes bilaterally lower extremities.   Everrett Coombe 03/05/2016, 10:52 AM

## 2016-03-05 NOTE — Progress Notes (Addendum)
Preliminary results by tech - Left Lower Ext. Arterial Duplex Completed. The left common femoral artery, superficial femoral artery and popliteal artery demonstrated patency with a mild to moderate amount of arthrosclerotic plaque seen throughout without evidence of hemodynamically significant stenosis. The posterior tibial, anterior tibial and peroneal arteries all appeared patent with a moderate amount of calcified plaque visualized throughout with monophasic flow was seen at the distal level.  A patent AVG in the proximal thigh was noted. Oda Cogan, BS, RDMS, RVT

## 2016-03-06 ENCOUNTER — Observation Stay (HOSPITAL_COMMUNITY): Payer: Medicare Other

## 2016-03-06 ENCOUNTER — Encounter (HOSPITAL_COMMUNITY): Payer: Self-pay | Admitting: General Practice

## 2016-03-06 DIAGNOSIS — I259 Chronic ischemic heart disease, unspecified: Secondary | ICD-10-CM

## 2016-03-06 DIAGNOSIS — I2510 Atherosclerotic heart disease of native coronary artery without angina pectoris: Secondary | ICD-10-CM | POA: Diagnosis not present

## 2016-03-06 DIAGNOSIS — J440 Chronic obstructive pulmonary disease with acute lower respiratory infection: Secondary | ICD-10-CM | POA: Diagnosis present

## 2016-03-06 DIAGNOSIS — T82898A Other specified complication of vascular prosthetic devices, implants and grafts, initial encounter: Secondary | ICD-10-CM | POA: Diagnosis not present

## 2016-03-06 DIAGNOSIS — J189 Pneumonia, unspecified organism: Secondary | ICD-10-CM | POA: Diagnosis not present

## 2016-03-06 DIAGNOSIS — Z452 Encounter for adjustment and management of vascular access device: Secondary | ICD-10-CM | POA: Diagnosis not present

## 2016-03-06 DIAGNOSIS — I959 Hypotension, unspecified: Secondary | ICD-10-CM | POA: Diagnosis not present

## 2016-03-06 DIAGNOSIS — I25708 Atherosclerosis of coronary artery bypass graft(s), unspecified, with other forms of angina pectoris: Secondary | ICD-10-CM | POA: Diagnosis not present

## 2016-03-06 DIAGNOSIS — K219 Gastro-esophageal reflux disease without esophagitis: Secondary | ICD-10-CM | POA: Diagnosis not present

## 2016-03-06 DIAGNOSIS — Z992 Dependence on renal dialysis: Secondary | ICD-10-CM | POA: Diagnosis not present

## 2016-03-06 DIAGNOSIS — I214 Non-ST elevation (NSTEMI) myocardial infarction: Secondary | ICD-10-CM | POA: Diagnosis not present

## 2016-03-06 DIAGNOSIS — R509 Fever, unspecified: Secondary | ICD-10-CM | POA: Diagnosis not present

## 2016-03-06 DIAGNOSIS — I70244 Atherosclerosis of native arteries of left leg with ulceration of heel and midfoot: Secondary | ICD-10-CM | POA: Diagnosis not present

## 2016-03-06 DIAGNOSIS — I132 Hypertensive heart and chronic kidney disease with heart failure and with stage 5 chronic kidney disease, or end stage renal disease: Secondary | ICD-10-CM | POA: Diagnosis not present

## 2016-03-06 DIAGNOSIS — I471 Supraventricular tachycardia: Secondary | ICD-10-CM | POA: Diagnosis not present

## 2016-03-06 DIAGNOSIS — Z515 Encounter for palliative care: Secondary | ICD-10-CM | POA: Diagnosis not present

## 2016-03-06 DIAGNOSIS — J90 Pleural effusion, not elsewhere classified: Secondary | ICD-10-CM | POA: Diagnosis not present

## 2016-03-06 DIAGNOSIS — A419 Sepsis, unspecified organism: Secondary | ICD-10-CM | POA: Diagnosis not present

## 2016-03-06 DIAGNOSIS — Y95 Nosocomial condition: Secondary | ICD-10-CM | POA: Diagnosis present

## 2016-03-06 DIAGNOSIS — I209 Angina pectoris, unspecified: Secondary | ICD-10-CM | POA: Diagnosis not present

## 2016-03-06 DIAGNOSIS — R079 Chest pain, unspecified: Secondary | ICD-10-CM | POA: Diagnosis not present

## 2016-03-06 DIAGNOSIS — R6521 Severe sepsis with septic shock: Secondary | ICD-10-CM | POA: Diagnosis not present

## 2016-03-06 DIAGNOSIS — Z7901 Long term (current) use of anticoagulants: Secondary | ICD-10-CM | POA: Diagnosis not present

## 2016-03-06 DIAGNOSIS — I120 Hypertensive chronic kidney disease with stage 5 chronic kidney disease or end stage renal disease: Secondary | ICD-10-CM | POA: Diagnosis not present

## 2016-03-06 DIAGNOSIS — J449 Chronic obstructive pulmonary disease, unspecified: Secondary | ICD-10-CM | POA: Diagnosis present

## 2016-03-06 DIAGNOSIS — B182 Chronic viral hepatitis C: Secondary | ICD-10-CM | POA: Diagnosis not present

## 2016-03-06 DIAGNOSIS — I5032 Chronic diastolic (congestive) heart failure: Secondary | ICD-10-CM | POA: Diagnosis not present

## 2016-03-06 DIAGNOSIS — E1151 Type 2 diabetes mellitus with diabetic peripheral angiopathy without gangrene: Secondary | ICD-10-CM | POA: Diagnosis present

## 2016-03-06 DIAGNOSIS — J9600 Acute respiratory failure, unspecified whether with hypoxia or hypercapnia: Secondary | ICD-10-CM | POA: Diagnosis not present

## 2016-03-06 DIAGNOSIS — T8640 Unspecified complication of liver transplant: Secondary | ICD-10-CM | POA: Diagnosis not present

## 2016-03-06 DIAGNOSIS — E1122 Type 2 diabetes mellitus with diabetic chronic kidney disease: Secondary | ICD-10-CM | POA: Diagnosis present

## 2016-03-06 DIAGNOSIS — N2581 Secondary hyperparathyroidism of renal origin: Secondary | ICD-10-CM | POA: Diagnosis present

## 2016-03-06 DIAGNOSIS — I509 Heart failure, unspecified: Secondary | ICD-10-CM | POA: Diagnosis not present

## 2016-03-06 DIAGNOSIS — E118 Type 2 diabetes mellitus with unspecified complications: Secondary | ICD-10-CM | POA: Diagnosis not present

## 2016-03-06 DIAGNOSIS — I739 Peripheral vascular disease, unspecified: Secondary | ICD-10-CM | POA: Diagnosis present

## 2016-03-06 DIAGNOSIS — J410 Simple chronic bronchitis: Secondary | ICD-10-CM | POA: Diagnosis not present

## 2016-03-06 DIAGNOSIS — E11621 Type 2 diabetes mellitus with foot ulcer: Secondary | ICD-10-CM | POA: Diagnosis present

## 2016-03-06 DIAGNOSIS — E875 Hyperkalemia: Secondary | ICD-10-CM | POA: Diagnosis present

## 2016-03-06 DIAGNOSIS — E119 Type 2 diabetes mellitus without complications: Secondary | ICD-10-CM | POA: Diagnosis not present

## 2016-03-06 DIAGNOSIS — J810 Acute pulmonary edema: Secondary | ICD-10-CM | POA: Diagnosis not present

## 2016-03-06 DIAGNOSIS — E1129 Type 2 diabetes mellitus with other diabetic kidney complication: Secondary | ICD-10-CM | POA: Diagnosis not present

## 2016-03-06 DIAGNOSIS — I4892 Unspecified atrial flutter: Secondary | ICD-10-CM | POA: Diagnosis present

## 2016-03-06 DIAGNOSIS — D631 Anemia in chronic kidney disease: Secondary | ICD-10-CM | POA: Diagnosis not present

## 2016-03-06 DIAGNOSIS — Z944 Liver transplant status: Secondary | ICD-10-CM | POA: Diagnosis not present

## 2016-03-06 DIAGNOSIS — L89152 Pressure ulcer of sacral region, stage 2: Secondary | ICD-10-CM | POA: Diagnosis not present

## 2016-03-06 DIAGNOSIS — M7989 Other specified soft tissue disorders: Secondary | ICD-10-CM | POA: Diagnosis not present

## 2016-03-06 DIAGNOSIS — N186 End stage renal disease: Secondary | ICD-10-CM | POA: Diagnosis not present

## 2016-03-06 DIAGNOSIS — Z01810 Encounter for preprocedural cardiovascular examination: Secondary | ICD-10-CM | POA: Diagnosis not present

## 2016-03-06 DIAGNOSIS — Z7189 Other specified counseling: Secondary | ICD-10-CM | POA: Diagnosis not present

## 2016-03-06 DIAGNOSIS — M79605 Pain in left leg: Secondary | ICD-10-CM | POA: Diagnosis not present

## 2016-03-06 DIAGNOSIS — Z952 Presence of prosthetic heart valve: Secondary | ICD-10-CM | POA: Diagnosis not present

## 2016-03-06 DIAGNOSIS — E43 Unspecified severe protein-calorie malnutrition: Secondary | ICD-10-CM | POA: Diagnosis not present

## 2016-03-06 DIAGNOSIS — I248 Other forms of acute ischemic heart disease: Secondary | ICD-10-CM | POA: Diagnosis not present

## 2016-03-06 DIAGNOSIS — E1140 Type 2 diabetes mellitus with diabetic neuropathy, unspecified: Secondary | ICD-10-CM | POA: Diagnosis present

## 2016-03-06 DIAGNOSIS — J9601 Acute respiratory failure with hypoxia: Secondary | ICD-10-CM | POA: Diagnosis not present

## 2016-03-06 DIAGNOSIS — R748 Abnormal levels of other serum enzymes: Secondary | ICD-10-CM | POA: Diagnosis not present

## 2016-03-06 DIAGNOSIS — M79672 Pain in left foot: Secondary | ICD-10-CM | POA: Diagnosis not present

## 2016-03-06 LAB — CBC
HCT: 32.3 % — ABNORMAL LOW (ref 36.0–46.0)
Hemoglobin: 10.9 g/dL — ABNORMAL LOW (ref 12.0–15.0)
MCH: 32.5 pg (ref 26.0–34.0)
MCHC: 33.7 g/dL (ref 30.0–36.0)
MCV: 96.4 fL (ref 78.0–100.0)
PLATELETS: 240 10*3/uL (ref 150–400)
RBC: 3.35 MIL/uL — AB (ref 3.87–5.11)
RDW: 16 % — ABNORMAL HIGH (ref 11.5–15.5)
WBC: 15.2 10*3/uL — AB (ref 4.0–10.5)

## 2016-03-06 LAB — TROPONIN I
Troponin I: 2.19 ng/mL (ref <0.03)
Troponin I: 3.48 ng/mL (ref <0.03)

## 2016-03-06 LAB — BASIC METABOLIC PANEL
ANION GAP: 15 (ref 5–15)
BUN: 56 mg/dL — ABNORMAL HIGH (ref 6–20)
CALCIUM: 8.3 mg/dL — AB (ref 8.9–10.3)
CHLORIDE: 95 mmol/L — AB (ref 101–111)
CO2: 26 mmol/L (ref 22–32)
Creatinine, Ser: 9.34 mg/dL — ABNORMAL HIGH (ref 0.44–1.00)
GFR calc non Af Amer: 4 mL/min — ABNORMAL LOW (ref >60)
GFR, EST AFRICAN AMERICAN: 5 mL/min — AB (ref >60)
Glucose, Bld: 139 mg/dL — ABNORMAL HIGH (ref 65–99)
POTASSIUM: 6.6 mmol/L — AB (ref 3.5–5.1)
Sodium: 136 mmol/L (ref 135–145)

## 2016-03-06 LAB — HEPARIN LEVEL (UNFRACTIONATED)
Heparin Unfractionated: 0.22 IU/mL — ABNORMAL LOW (ref 0.30–0.70)
Heparin Unfractionated: 0.22 IU/mL — ABNORMAL LOW (ref 0.30–0.70)

## 2016-03-06 LAB — INFLUENZA PANEL BY PCR (TYPE A & B)
INFLAPCR: NEGATIVE
INFLBPCR: NEGATIVE

## 2016-03-06 LAB — GLUCOSE, CAPILLARY
Glucose-Capillary: 106 mg/dL — ABNORMAL HIGH (ref 65–99)
Glucose-Capillary: 78 mg/dL (ref 65–99)

## 2016-03-06 LAB — HEMOGLOBIN A1C
HEMOGLOBIN A1C: 7.5 % — AB (ref 4.8–5.6)
Mean Plasma Glucose: 169 mg/dL

## 2016-03-06 LAB — TSH: TSH: 4.836 u[IU]/mL — ABNORMAL HIGH (ref 0.350–4.500)

## 2016-03-06 MED ORDER — DARBEPOETIN ALFA 60 MCG/0.3ML IJ SOSY
PREFILLED_SYRINGE | INTRAMUSCULAR | Status: AC
  Filled 2016-03-06: qty 0.3

## 2016-03-06 MED ORDER — MIDODRINE HCL 5 MG PO TABS
ORAL_TABLET | ORAL | Status: AC
  Administered 2016-03-06: 5 mg
  Filled 2016-03-06: qty 1

## 2016-03-06 MED ORDER — MORPHINE SULFATE (PF) 4 MG/ML IV SOLN
INTRAVENOUS | Status: AC
  Filled 2016-03-06: qty 1

## 2016-03-06 MED ORDER — CALCITRIOL 0.25 MCG PO CAPS
ORAL_CAPSULE | ORAL | Status: AC
  Filled 2016-03-06: qty 1

## 2016-03-06 MED ORDER — HEPARIN (PORCINE) IN NACL 100-0.45 UNIT/ML-% IJ SOLN
1400.0000 [IU]/h | INTRAMUSCULAR | Status: DC
  Administered 2016-03-06: 1250 [IU]/h via INTRAVENOUS
  Administered 2016-03-07 – 2016-03-11 (×6): 1400 [IU]/h via INTRAVENOUS
  Filled 2016-03-06 (×9): qty 250

## 2016-03-06 MED ORDER — REGADENOSON 0.4 MG/5ML IV SOLN
0.4000 mg | Freq: Once | INTRAVENOUS | Status: DC
  Filled 2016-03-06: qty 5

## 2016-03-06 MED ORDER — SODIUM POLYSTYRENE SULFONATE 15 GM/60ML PO SUSP
30.0000 g | Freq: Once | ORAL | Status: AC
  Administered 2016-03-06: 30 g via ORAL
  Filled 2016-03-06: qty 120

## 2016-03-06 MED ORDER — SODIUM CHLORIDE 0.9 % IV BOLUS (SEPSIS)
250.0000 mL | Freq: Once | INTRAVENOUS | Status: AC
Start: 2016-03-06 — End: 2016-03-06
  Administered 2016-03-06: 250 mL via INTRAVENOUS

## 2016-03-06 MED ORDER — HEPARIN BOLUS VIA INFUSION
2000.0000 [IU] | Freq: Once | INTRAVENOUS | Status: AC
  Administered 2016-03-06: 2000 [IU] via INTRAVENOUS
  Filled 2016-03-06: qty 2000

## 2016-03-06 NOTE — Progress Notes (Addendum)
CRITICAL VALUE ALERT  Critical value received:  Potassium 6.6, troponin 3.48, and temp 103  Date of notification:  03/06/16   Time of notification:  4:33 AM   Critical value read back:Yes.    Nurse who received alert:  Levonne Hubert   MD notified (1st page):  Sch[place]  Time of first page:  4:33 AM   MD notified (2nd page):  Time of second page:  Responding MD:    Time MD responded:

## 2016-03-06 NOTE — Progress Notes (Signed)
ANTICOAGULATION CONSULT NOTE - Follow-up Consult  Pharmacy Consult for heparin Indication: atrial flutter, AVR   Allergies  Allergen Reactions  . Acetaminophen Other (See Comments)    Liver transplant recipient   . Codeine Itching  . Mirtazapine Other (See Comments)    hallucination  . Morphine Itching and Other (See Comments)    hallucinate    Patient Measurements: Height: 5\' 4"  (162.6 cm) Weight: 142 lb (64.4 kg) IBW/kg (Calculated) : 54.7 Heparin Dosing Weight: 59 kg  Vital Signs: Temp: 100.5 F (38.1 C) (01/25 0917) Temp Source: Oral (01/25 0917) BP: 104/55 (01/25 0853)  Labs:  Recent Labs  03/05/16 0927 03/05/16 1529 03/05/16 2122 03/05/16 2309 03/06/16 0248 03/06/16 0853  HGB 9.8*  --   --   --  10.9*  --   HCT 30.1*  --   --   --  32.3*  --   PLT 158  --   --   --  240  --   LABPROT 21.2*  --   --   --   --   --   INR 1.81  --   --   --   --   --   HEPARINUNFRC  --   --   --  <0.10*  --  0.22*  CREATININE 8.11*  --   --   --  9.34*  --   TROPONINI  --  1.38* 2.19*  --  3.48*  --     Estimated Creatinine Clearance: 5.5 mL/min (by C-G formula based on SCr of 9.34 mg/dL (H)).  Assessment: Pharmacy consulted to dose heparin bridge for atrial flutter and AVR.  Pt on coumadin PTA per coumadin clinic.  Home dose is 2 mg daily, last dose 1/23. Last coumadin clinic visit 02/29/16 with INR 2.7,  INR goal 2.5 - 3 per clinic notes.  Gets INRs drawn at HD center.  INR subtherapeutic (1.81) on admission.  Heparin level now at 0.22  Goal of Therapy:  Heparin level 0.3-0.7 units/ml; INR 2.5-3 Monitor platelets by anticoagulation protocol: Yes   Plan: Heparin infusion to 1100 units / hr Heparin level 8 hours after increase  Thank you Anette Guarneri, PharmD [phone] 03/06/2016 9:52 AM

## 2016-03-06 NOTE — Progress Notes (Signed)
PROGRESS NOTE  [name] [id number] DOB: [birth year] DOA: 03/05/2016 PCP: No PCP Per Patient   LOS: 0 days   Brief Narrative: This is a 61 y.o female PMH significant for liver transplant, CAD, peripheral vascular disease, diabetes, neuromuscular disorder, COPD, liver failure with hemodialysis.  She came to the ED with worsening lower leg pain.  After workup, patient was found to have elevated troponin, EKG changes indicative for ischemia as well as decreased blood flow to the lower extremities.  Her left femoral AV graft was cool to touch and showed no signs of infection.   Assessment & Plan:  Chest pain / CAD / Elevated troponin  Patient reports the pain to have improved and is on heparin drip. Troponin on 1/24 was 2.19, this morning it is 3.48.  Continue to trend values.  EKG reveals possible lateral ischemia. Cardiology following, stress in am     Essential hypertension Blood pressure currently controlled on metoprolol.    End stage renal disease on dialysis-TTS Renal failure occurred during time of liver transplant in 2011.  Patient has been on HD since then. She has had two failed forearm grafts due to suspected infection and acute steal-like symptoms. L thigh AVG put in January 2013.  Left Leg pain:  Patient reports severe pain in the left lower leg, with toes that have become "progressively darker." Pulses are diminished bilaterally.  Left phalanges are cool to touch. Vascular surgery following.  Diabetes mellitus type 2:  Controlled on Levemir and Novolog. A1c (1/24): 7.5, A1c (10/10/15): 5.0. Glucose remains above 100. Continue to monitor CBG.    S/P liver transplant: (2011) Liver transplant done in 2011 for hepatitis C. 12/2015 she was hospitalized for acute liver transplant rejection, but ERCP revealed anastomotic stricture, which was dilated followed by stent placement.  Patient has not had any problems since.  CMP pending.  CAD- s/p LIMA-LAD Feb. 2015:  History of  multiple left heart catheterizations with coronary angiogram from 2013 - 2015.  COPD:  Mild COPD well controlled.   Paroxysmal supraventricular tachycardia, s/p valve replacement, with chest pain.  On metop  Diastolic CHF, chronic (HCC) - stable  Hypothyroidism:  Controlled on synthroid. Last TSH was 17.21 (01/2016).  Will recheck on TSH.    DVT prophylaxis: Coumadin Code Status: Partial Family Communication: no family at bedside Disposition Plan: TBD  Consultants:   Cardiology   Vascular surgery   Procedures:   Cardiac stress test.   Antimicrobials:  None  Subjective: Patient states that chest pain has improved since yesterday.  She seems concerned about all that is happening.  She complains of pain in her left heel, due to pressure ulcer.  She also mentions progressive numbness in her hands.   Objective: Vitals:   03/06/16 1000 03/06/16 1005 03/06/16 1030 03/06/16 1100  BP: 117/62 (!) 103/59 (!) 107/51 112/60  Pulse: 82 82 81 80  Resp: 17 18 16 17   Temp:      TempSrc:      SpO2: 100% 99% 99% 99%  Weight:      Height:        Intake/Output Summary (Last 24 hours) at 03/06/16 1111 Last data filed at 03/06/16 0557  Gross per 24 hour  Intake           240.16 ml  Output                0 ml  Net           240.16 ml  Filed Weights   03/05/16 1700 03/06/16 0500 03/06/16 0950  Weight: 59 kg (130 lb) 64.4 kg (142 lb) 64.2 kg (141 lb 8.6 oz)    Examination: Constitutional: NAD Vitals:   03/06/16 1000 03/06/16 1005 03/06/16 1030 03/06/16 1100  BP: 117/62 (!) 103/59 (!) 107/51 112/60  Pulse: 82 82 81 80  Resp: 17 18 16 17   Temp:      TempSrc:      SpO2: 100% 99% 99% 99%  Weight:      Height:       Constitutional: Chronically ill-appearing female.   Eyes: PERRL, lids and conjunctivae normal ENMT: Mucous membranes are dry.  Respiratory: clear to auscultation bilaterally, no wheezing, no crackles. Normal respiratory effort. No accessory muscle use.    Cardiovascular: Regular rate and rhythm, systolic ejection murmur with click/ rubs / gallops. No LE edema. 1+ pedal pulses.   Abdomen: no tenderness. Bowel sounds positive.  Musculoskeletal: No joint deformity upper and lower extremities. No contractures. Normal muscle tone. Left lower extremity has diffuse tenderness throughout the left lower calf, ankle and foot with gentle palpation.  No erythemal or edema noted.   Skin: Unstageable pressure ulcer on the left calcaneus.  Neurologic: No focal deficits Psychiatric: Normal judgment and insight. Alert and oriented x 3. Normal mood.    Data Reviewed: I have personally reviewed following labs and imaging studies  CBC:  Recent Labs Lab 03/05/16 0927 03/06/16 0248  WBC 17.2* 15.2*  HGB 9.8* 10.9*  HCT 30.1* 32.3*  MCV 97.1 96.4  PLT 158 [id number]   Basic Metabolic Panel:  Recent Labs Lab 03/05/16 0927 03/06/16 0248  NA 134* 136  K 5.2* 6.6*  CL 94* 95*  CO2 25 26  GLUCOSE 167* 139*  BUN 43* 56*  CREATININE 8.11* 9.34*  CALCIUM 8.3* 8.3*   GFR: Estimated Creatinine Clearance: 5.5 mL/min (by C-G formula based on SCr of 9.34 mg/dL (H)). Liver Function Tests: No results for input(s): AST, ALT, ALKPHOS, BILITOT, PROT, ALBUMIN in the last 168 hours. No results for input(s): LIPASE, AMYLASE in the last 168 hours. No results for input(s): AMMONIA in the last 168 hours. Coagulation Profile:  Recent Labs Lab 03/05/16 0927  INR 1.81   Cardiac Enzymes:  Recent Labs Lab 03/05/16 1529 03/05/16 2122 03/06/16 0248  TROPONINI 1.38* 2.19* 3.48*   BNP (last 3 results) No results for input(s): PROBNP in the last 8760 hours. HbA1C:  Recent Labs  03/05/16 1529  HGBA1C 7.5*   CBG:  Recent Labs Lab 03/05/16 1845 03/05/16 2117 03/06/16 0728  GLUCAP 132* 168* 106*   Lipid Profile: No results for input(s): CHOL, HDL, LDLCALC, TRIG, CHOLHDL, LDLDIRECT in the last 72 hours. Thyroid Function Tests: No results for input(s):  TSH, T4TOTAL, FREET4, T3FREE, THYROIDAB in the last 72 hours. Anemia Panel: No results for input(s): VITAMINB12, FOLATE, FERRITIN, TIBC, IRON, RETICCTPCT in the last 72 hours. Urine analysis:    Component Value Date/Time   COLORURINE YELLOW 11/02/2012 0428   APPEARANCEUR CLOUDY (A) 11/02/2012 0428   LABSPEC 1.011 11/02/2012 0428   PHURINE 8.0 11/02/2012 0428   GLUCOSEU 250 (A) 11/02/2012 0428   HGBUR LARGE (A) 11/02/2012 0428   BILIRUBINUR NEGATIVE 11/02/2012 0428   KETONESUR NEGATIVE 11/02/2012 0428   PROTEINUR >300 (A) 11/02/2012 0428   UROBILINOGEN 0.2 11/02/2012 0428   NITRITE NEGATIVE 11/02/2012 0428   LEUKOCYTESUR NEGATIVE 11/02/2012 0428   Sepsis Labs: Invalid input(s): PROCALCITONIN, LACTICIDVEN  Recent Results (from the past 240 hour(s))  MRSA PCR  Screening     Status: None   Collection Time: 03/05/16  6:37 PM  Result Value Ref Range Status   MRSA by PCR NEGATIVE NEGATIVE Final    Comment:        The GeneXpert MRSA Assay (FDA approved for NASAL specimens only), is one component of a comprehensive MRSA colonization surveillance program. It is not intended to diagnose MRSA infection nor to guide or monitor treatment for MRSA infections.       Radiology Studies: Dg Chest 2 View  Result Date: 03/05/2016 CLINICAL DATA:  Diabetes, coronary artery disease, liver transplant patient, history of asthma. No reported chest complaints. The patient is complaining of foot pain. EXAM: CHEST  2 VIEW COMPARISON:  PA and lateral chest x-ray of November 26, 2015 FINDINGS: The lungs are adequately inflated. There is a trace of pleural fluid at the left lung base. The interstitial markings are mildly prominent though stable. The heart is top-normal in size. There is a prosthetic aortic valve in place. The sternal wires are intact. The pulmonary vascularity is not engorged. There is calcification in the wall of the aortic arch. The bony thorax exhibits no acute abnormality. IMPRESSION:  Small left pleural effusion. Mild interstitial prominence which is not entirely new. No alveolar pneumonia nor CHF. Thoracic aortic atherosclerosis. Electronically Signed   By: David  Martinique M.D.   On: 03/05/2016 08:50     Scheduled Meds: . calcitRIOL  0.25 mcg Oral Q T,Th,Sa-HD  . calcium acetate  667 mg Oral TID WC  . darbepoetin (ARANESP) injection - DIALYSIS  60 mcg Intravenous Q Thu-HD  . gabapentin  300 mg Oral QPM  . insulin aspart  0-5 Units Subcutaneous QHS  . insulin aspart  0-9 Units Subcutaneous TID WC  . insulin detemir  5 Units Subcutaneous QHS  . levothyroxine  175 mcg Oral QAC breakfast  . metoprolol tartrate  50 mg Oral TID  . multivitamin  1 tablet Oral QHS  . [START ON 03/07/2016] regadenoson  0.4 mg Intravenous Once  . senna-docusate  1 tablet Oral Daily  . sodium chloride flush  3 mL Intravenous Q12H  . tacrolimus  2 mg Oral BID   Continuous Infusions: . heparin 1,100 Units/hr (03/06/16 0956)   Marcie Bal, PA-S [place]  Attending MD note  Patient was seen, examined,treatment plan was discussed with the PA-S Marcie Bal.  I have personally reviewed the clinical findings, lab, imaging studies and management of this patient in detail. I agree with the documentation, as recorded by the PA-S.   Patient is a 61 y.o. female  who presents with chronic LE pain likely from PAD and neuropathy from chronic poorly controlled DM and with CP concerning for ACS  On Exam: Gen. exam: Awake, alert, not in any distress Chest: Good air entry bilaterally, no rhonchi or rales CVS: S1-S2 regular, no murmurs. Decreased pulses bilaterally  Abdomen: Soft, nontender and nondistended Neurology: Non-focal Skin: No rash or lesions   Chest pain / CAD - Patient with extended history of coronary artery disease, presented 1/24 with chest pain. Troponin has been slowly trending up and reached almost 4 this morning. Patient is on heparin drip per cardiology, continue, it  appears that she will have a stress test tomorrow morning.  SVT - resolved, continue Metoprolol  Hypothyroidism - Patient on Synthroid, her last TSH checked as an outpatient last year was elevated at 17, and it appears that her Synthroid dose was increased. Repeat TSH now is 4.8, continue  Chronic bilateral lower extremity pain, left worse than the right - Last surgery consulted, appreciate input.  Mild COPD - No breathing, this is stable, no wheezing  Insulin-dependent diabetes mellitus - Continue Levemir and sliding scale  ESRD - Nephrology consulted, appreciate input, on dialysis  Chronic immunosuppression in the setting of liver transplantation - Continue Prograf    Marzetta Board, MD, PhD [facility] Pager [phone]  If 7PM-7AM, please contact night-coverage [email or web address] Password [email or web address] 03/06/2016, 11:11 AM

## 2016-03-06 NOTE — Progress Notes (Signed)
Schorr, NP notified of temp 103, pt states she cannot take tylenol d/t liver transplant, and cannot take NSAIDs d/t renal failure.

## 2016-03-06 NOTE — Progress Notes (Signed)
ANTICOAGULATION CONSULT NOTE - Follow-up Consult  Pharmacy Consult for heparin Indication: atrial flutter, AVR   Allergies  Allergen Reactions  . Acetaminophen Other (See Comments)    Liver transplant recipient   . Codeine Itching  . Mirtazapine Other (See Comments)    hallucination  . Morphine Itching and Other (See Comments)    hallucinate    Patient Measurements: Height: 5\' 4"  (162.6 cm) Weight: 139 lb 12.4 oz (63.4 kg) IBW/kg (Calculated) : 54.7 Heparin Dosing Weight: 59 kg  Vital Signs: Temp: 99.7 F (37.6 C) (01/25 1541) Temp Source: Oral (01/25 1541) BP: 118/55 (01/25 1652) Pulse Rate: 95 (01/25 1652)  Labs:  Recent Labs  03/05/16 0927 03/05/16 1529 03/05/16 2122 03/05/16 2309 03/06/16 0248 03/06/16 0853 03/06/16 1828  HGB 9.8*  --   --   --  10.9*  --   --   HCT 30.1*  --   --   --  32.3*  --   --   PLT 158  --   --   --  240  --   --   LABPROT 21.2*  --   --   --   --   --   --   INR 1.81  --   --   --   --   --   --   HEPARINUNFRC  --   --   --  <0.10*  --  0.22* 0.22*  CREATININE 8.11*  --   --   --  9.34*  --   --   TROPONINI  --  1.38* 2.19*  --  3.48*  --   --     Estimated Creatinine Clearance: 5.5 mL/min (by C-G formula based on SCr of 9.34 mg/dL (H)).  Assessment: Pharmacy consulted to dose heparin bridge for atrial flutter and AVR.  Pt on coumadin PTA per coumadin clinic.  Home dose is 2 mg daily, last dose 1/23. Last coumadin clinic visit 02/29/16 with INR 2.7,  INR goal 2.5 - 3 per clinic notes.  Gets INRs drawn at HD center.  INR subtherapeutic (1.81) on admission.  Heparin level remains subtherapeutic at 0.22 despite heparin gtt rate increase earlier today from 950 units/hr up to 1100 units/hr.  Goal of Therapy:  Heparin level 0.3-0.7 units/ml; INR 2.5-3 Monitor platelets by anticoagulation protocol: Yes   Plan: Heparin infusion to 1250 units / hr Will recheck heparin level with AM labs. Daily heparin level and CBC.  Uvaldo Rising, BCPS  Clinical Pharmacist Pager [phone]  03/06/2016 7:34 PM

## 2016-03-06 NOTE — Consult Note (Signed)
Case discussed with Dr Trula Slade who did pt prior intervention.  He will see today regarding plan of graft ligation left leg  Ruta Hinds, MD [facility] Office: [phone] Pager: [phone]

## 2016-03-06 NOTE — Progress Notes (Signed)
ANTICOAGULATION CONSULT NOTE - Follow-up Consult  Pharmacy Consult for heparin Indication: atrial flutter, AVR   Allergies  Allergen Reactions  . Acetaminophen Other (See Comments)    Liver transplant recipient   . Codeine Itching  . Mirtazapine Other (See Comments)    hallucination  . Morphine Itching and Other (See Comments)    hallucinate    Patient Measurements: Height: 5\' 4"  (162.6 cm) Weight: 130 lb (59 kg) IBW/kg (Calculated) : 54.7 Heparin Dosing Weight: 59 kg  Vital Signs: Temp: 100.9 F (38.3 C) (01/25 0017) Temp Source: Oral (01/25 0017) BP: 155/63 (01/24 2110) Pulse Rate: 84 (01/24 2110)  Labs:  Recent Labs  03/05/16 0927 03/05/16 1529 03/05/16 2309  HGB 9.8*  --   --   HCT 30.1*  --   --   PLT 158  --   --   LABPROT 21.2*  --   --   INR 1.81  --   --   HEPARINUNFRC  --   --  <0.10*  CREATININE 8.11*  --   --   TROPONINI  --  1.38*  --     Estimated Creatinine Clearance: 6.3 mL/min (by C-G formula based on SCr of 8.11 mg/dL (H)).  Assessment: Pharmacy consulted to dose heparin bridge for atrial flutter and AVR.  Pt on coumadin PTA per coumadin clinic.  Home dose is 2 mg daily, last dose 1/23. Last coumadin clinic visit 02/29/16 with INR 2.7,  INR goal 2.5 - 3 per clinic notes.  Gets INRs drawn at HD center.  INR subtherapeutic (1.81) on admission.  Heparin level undetectable on initial rate of 700 units/hr. No issues with bleeding reported per RN. RN noted a few times IV beeping "occluded" but was fixed promptly so shouldn't have affected level.  Goal of Therapy:  Heparin level 0.3-0.7 units/ml; INR 2.5-3 Monitor platelets by anticoagulation protocol: Yes   Plan: Rebolus heparin 2000 units and increase heparin to 950 units/hr Check 8 hour heparin level  Sherlon Handing, PharmD, BCPS Clinical pharmacist, pager [phone] 03/06/2016 12:46 AM

## 2016-03-06 NOTE — Progress Notes (Signed)
[facility] Progress Note   Subjective:  No c/o  Vitals:   03/06/16 1300 03/06/16 1330 03/06/16 1541 03/06/16 1652  BP: 119/60 (P) 123/88 (!) 119/49 (!) 118/55  Pulse: 84 (P) 83 93 95  Resp: 15 (P) 16 12   Temp:   99.7 F (37.6 C)   TempSrc:   Oral   SpO2: 98% (P) 98% 100%   Weight:      Height:        Inpatient medications: . calcitRIOL  0.25 mcg Oral Q T,Th,Sa-HD  . calcium acetate  667 mg Oral TID WC  . darbepoetin (ARANESP) injection - DIALYSIS  60 mcg Intravenous Q Thu-HD  . gabapentin  300 mg Oral QPM  . insulin aspart  0-5 Units Subcutaneous QHS  . insulin aspart  0-9 Units Subcutaneous TID WC  . insulin detemir  5 Units Subcutaneous QHS  . levothyroxine  175 mcg Oral QAC breakfast  . metoprolol tartrate  50 mg Oral TID  . multivitamin  1 tablet Oral QHS  . [START ON 03/07/2016] regadenoson  0.4 mg Intravenous Once  . senna-docusate  1 tablet Oral Daily  . sodium chloride flush  3 mL Intravenous Q12H  . tacrolimus  2 mg Oral BID   . heparin 1,100 Units/hr (03/06/16 0956)   acetaminophen, morphine injection, ondansetron (ZOFRAN) IV, ondansetron, oxyCODONE  Exam: General: alert / chronically ill AAF / NAD / OX3 HEENT: [place] / EOMI/ Nonicteric  Neck: supple , no jvd Heart: RRR on tel. mech clinking / no rub or gal.  Lungs: cta  Abdomen: bs pos soft nt ,nd Extremities: L trace pedal edema Skin: Bilat Toes Ischemic appearance L>R / L heal dried ulcer/  Neuro: alert / moves all extrem . No overt focal deficits  Dialysis Access: Pos bruit L FEM AVGG  Dialysis Orders: Center: [facility]   on MWF . EDW 59.5  HD [facility]   Time 3hrs 8min/ Heparin no/ . Access L fem AVGG     Po calcitriol 0.47mcg /HD  MIrcera 60 mcg  q 2wks   *was to start 03/05/16" Other op labs hgb 9.8  Ca 9.3 phos 8.7  PTH 1294  Assessment/Plan 1. ESRD MWF HD - missed yest, HD today 2. A. Flutter with RVR= resolved with B blk -card following /anticoags/ 3. PAD/ L lower extrem pain= per  admit / VVS  To see 4. Chest Pain / CAD ho / AOR - card consulted "demand ischemia " 5. Hypertension/volume  -bp stable and no excess vol on exam  6. Anemia  - hgb 9.8  Start ESA with HD  7. Metabolic bone disease -  Noted OP Phos and PTH ^ with complaince / continue Vit d  And  phoslo  As binder 8. HO Liver transplant sec Hep c ( blood transfusion induced) - on meds 9. DM  Type 2 - per admit  10. copd  Plan - as above, HD today and short HD tomorrow   [name] MD [place] pager [phone]   03/06/2016, 4:53 PM    Recent Labs Lab 03/05/16 0927 03/06/16 0248  NA 134* 136  K 5.2* 6.6*  CL 94* 95*  CO2 25 26  GLUCOSE 167* 139*  BUN 43* 56*  CREATININE 8.11* 9.34*  CALCIUM 8.3* 8.3*   No results for input(s): AST, ALT, ALKPHOS, BILITOT, PROT, ALBUMIN in the last 168 hours.  Recent Labs Lab 03/05/16 0927 03/06/16 0248  WBC 17.2* 15.2*  HGB 9.8* 10.9*  HCT 30.1* 32.3*  MCV 97.1 96.4  PLT 158 240   Iron/TIBC/Ferritin/ %Sat    Component Value Date/Time   FERRITIN 1,095 (H) 08/30/2012 1449

## 2016-03-06 NOTE — Progress Notes (Signed)
Patient ID: [name], female   DOB: 1955-11-20, 61 y.o.   MRN: [id number]   Progress Note  Patient Name: [name] Date of Encounter: 03/06/2016  Primary Cardiologist: [name]   Subjective   No chest pain   Inpatient Medications    Scheduled Meds: . calcitRIOL  0.25 mcg Oral Q T,Th,Sa-HD  . calcium acetate  667 mg Oral TID WC  . darbepoetin (ARANESP) injection - DIALYSIS  60 mcg Intravenous Q Thu-HD  . gabapentin  300 mg Oral QPM  . insulin aspart  0-5 Units Subcutaneous QHS  . insulin aspart  0-9 Units Subcutaneous TID WC  . insulin detemir  5 Units Subcutaneous QHS  . levothyroxine  175 mcg Oral QAC breakfast  . metoprolol tartrate  50 mg Oral TID  . multivitamin  1 tablet Oral QHS  . senna-docusate  1 tablet Oral Daily  . sodium chloride flush  3 mL Intravenous Q12H  . tacrolimus  2 mg Oral BID   Continuous Infusions: . heparin 950 Units/hr (03/06/16 0054)   PRN Meds: acetaminophen, morphine injection, ondansetron (ZOFRAN) IV, ondansetron, oxyCODONE   Vital Signs    Vitals:   03/06/16 0100 03/06/16 0415 03/06/16 0500 03/06/16 0638  BP:      Pulse:      Resp:      Temp: 99 F (37.2 C) (!) 103 F (39.4 C)  (!) 101.3 F (38.5 C)  TempSrc: Axillary Oral  Oral  SpO2:      Weight:   142 lb (64.4 kg)   Height:        Intake/Output Summary (Last 24 hours) at 03/06/16 0754 Last data filed at 03/06/16 0557  Gross per 24 hour  Intake           240.16 ml  Output                0 ml  Net           240.16 ml   Filed Weights   03/05/16 0800 03/05/16 1700 03/06/16 0500  Weight: 130 lb (59 kg) 130 lb (59 kg) 142 lb (64.4 kg)    Telemetry    Hard to tell if flutter or NSR rate 90  - Personally Reviewed  ECG    Flutter LVH  - Personally Reviewed  Physical Exam   Affect appropriate Chronically ill black female  HEENT: normal Neck supple with no adenopathy JVP normal no bruits no thyromegaly Lungs clear with no wheezing and good diaphragmatic  motion Heart:  [id number] click SRM AR murmur, no rub, gallop or click PMI normal Abdomen: benighn, post liver transplant  no bruit.  No HSM or HJR Poor distal pulses Failed dialysis grafts UE;s access fistula left groin No edema Neuro non-focal Ulcer left heal No muscular weakness   Labs    Chemistry Recent Labs Lab 03/05/16 0927 03/06/16 0248  NA 134* 136  K 5.2* 6.6*  CL 94* 95*  CO2 25 26  GLUCOSE 167* 139*  BUN 43* 56*  CREATININE 8.11* 9.34*  CALCIUM 8.3* 8.3*  GFRNONAA 5* 4*  GFRAA 5* 5*  ANIONGAP 15 15     Hematology Recent Labs Lab 03/05/16 0927 03/06/16 0248  WBC 17.2* 15.2*  RBC 3.10* 3.35*  HGB 9.8* 10.9*  HCT 30.1* 32.3*  MCV 97.1 96.4  MCH 31.6 32.5  MCHC 32.6 33.7  RDW 16.4* 16.0*  PLT 158 240    Cardiac Enzymes Recent Labs Lab 03/05/16 1529 03/05/16 2122 03/06/16  0248  TROPONINI 1.38* 2.19* 3.48*    Recent Labs Lab 03/05/16 0942  TROPIPOC 0.65*     BNPNo results for input(s): BNP, PROBNP in the last 168 hours.   DDimer No results for input(s): DDIMER in the last 168 hours.   Radiology    Dg Chest 2 View  Result Date: 03/05/2016 CLINICAL DATA:  Diabetes, coronary artery disease, liver transplant patient, history of asthma. No reported chest complaints. The patient is complaining of foot pain. EXAM: CHEST  2 VIEW COMPARISON:  PA and lateral chest x-ray of November 26, 2015 FINDINGS: The lungs are adequately inflated. There is a trace of pleural fluid at the left lung base. The interstitial markings are mildly prominent though stable. The heart is top-normal in size. There is a prosthetic aortic valve in place. The sternal wires are intact. The pulmonary vascularity is not engorged. There is calcification in the wall of the aortic arch. The bony thorax exhibits no acute abnormality. IMPRESSION: Small left pleural effusion. Mild interstitial prominence which is not entirely new. No alveolar pneumonia nor CHF. Thoracic aortic atherosclerosis.  Electronically Signed   By: David  Martinique M.D.   On: 03/05/2016 08:50    Cardiac Studies   Echo: 10/19 EF 55-60% AVR ok mild AR   Patient Profile     61 y.o. female admitted with left leg pain and elevated troponin. History of CABG  Cath 2016 with patent LIMA to LAD. S/P liver transplant with CRF. May need revision Of fistula in left leg to improve flow to left foot  Assessment & Plan    Chest Pain : resolved no acute ECG changes troponin 3  Range but not clear of significance In setting renal failure. Had no other native disease on cath 2016 and LIMA was patent to LAD Given possible need for vascular surgery will plan lexiscan myovue for am .   Flutter: check ECG not clear if she has converted but rate much better continue beta blocker INR subRx on admission and possible need to hold coumadin for surgery so not a candidate For aggressive conversion. Have held off on amiodarone due to national shortage  CRF: ? Dialysis today renal/primary service to Rx hyperkalemia possible change access to RFA To improve LLE flow and ligate existing fistula Would need catheter access  AVR:  F/u echo known mild AR but systolic gradients ok r/o SOE since INR sub Rx on admission     Signed, Jenkins Rouge, MD  03/06/2016, 7:54 AM

## 2016-03-06 NOTE — Care Management Obs Status (Signed)
[place] NOTIFICATION   Patient Details  Name: MARIELIZ GRANAT MRN: [id number] Date of Birth: 17-Jan-1956   Medicare Observation Status Notification Given:  Yes    Bethena Roys, RN 03/06/2016, 4:36 PM

## 2016-03-07 ENCOUNTER — Inpatient Hospital Stay (HOSPITAL_COMMUNITY): Payer: Medicare Other

## 2016-03-07 DIAGNOSIS — I25708 Atherosclerosis of coronary artery bypass graft(s), unspecified, with other forms of angina pectoris: Secondary | ICD-10-CM

## 2016-03-07 DIAGNOSIS — J810 Acute pulmonary edema: Secondary | ICD-10-CM

## 2016-03-07 DIAGNOSIS — N186 End stage renal disease: Secondary | ICD-10-CM

## 2016-03-07 DIAGNOSIS — Z992 Dependence on renal dialysis: Secondary | ICD-10-CM

## 2016-03-07 DIAGNOSIS — I959 Hypotension, unspecified: Secondary | ICD-10-CM | POA: Diagnosis present

## 2016-03-07 DIAGNOSIS — A419 Sepsis, unspecified organism: Secondary | ICD-10-CM | POA: Diagnosis present

## 2016-03-07 LAB — COMPREHENSIVE METABOLIC PANEL
ALK PHOS: 314 U/L — AB (ref 38–126)
ALT: 16 U/L (ref 14–54)
AST: 33 U/L (ref 15–41)
Albumin: 1.9 g/dL — ABNORMAL LOW (ref 3.5–5.0)
Anion gap: 12 (ref 5–15)
BILIRUBIN TOTAL: 3 mg/dL — AB (ref 0.3–1.2)
BUN: 28 mg/dL — ABNORMAL HIGH (ref 6–20)
CO2: 27 mmol/L (ref 22–32)
CREATININE: 6.49 mg/dL — AB (ref 0.44–1.00)
Calcium: 8.4 mg/dL — ABNORMAL LOW (ref 8.9–10.3)
Chloride: 94 mmol/L — ABNORMAL LOW (ref 101–111)
GFR calc non Af Amer: 6 mL/min — ABNORMAL LOW (ref >60)
GFR, EST AFRICAN AMERICAN: 7 mL/min — AB (ref >60)
Glucose, Bld: 131 mg/dL — ABNORMAL HIGH (ref 65–99)
Potassium: 4.5 mmol/L (ref 3.5–5.1)
Sodium: 133 mmol/L — ABNORMAL LOW (ref 135–145)
Total Protein: 5.8 g/dL — ABNORMAL LOW (ref 6.5–8.1)

## 2016-03-07 LAB — CBC
HEMATOCRIT: 25.1 % — AB (ref 36.0–46.0)
Hemoglobin: 8.2 g/dL — ABNORMAL LOW (ref 12.0–15.0)
MCH: 31.5 pg (ref 26.0–34.0)
MCHC: 32.7 g/dL (ref 30.0–36.0)
MCV: 96.5 fL (ref 78.0–100.0)
Platelets: 157 10*3/uL (ref 150–400)
RBC: 2.6 MIL/uL — ABNORMAL LOW (ref 3.87–5.11)
RDW: 16.1 % — AB (ref 11.5–15.5)
WBC: 16 10*3/uL — ABNORMAL HIGH (ref 4.0–10.5)

## 2016-03-07 LAB — HEPARIN LEVEL (UNFRACTIONATED)
HEPARIN UNFRACTIONATED: 0.2 [IU]/mL — AB (ref 0.30–0.70)
Heparin Unfractionated: 0.33 IU/mL (ref 0.30–0.70)

## 2016-03-07 LAB — GLUCOSE, CAPILLARY
GLUCOSE-CAPILLARY: 90 mg/dL (ref 65–99)
GLUCOSE-CAPILLARY: 106 mg/dL — AB (ref 65–99)
GLUCOSE-CAPILLARY: 134 mg/dL — AB (ref 65–99)
Glucose-Capillary: 134 mg/dL — ABNORMAL HIGH (ref 65–99)
Glucose-Capillary: 99 mg/dL (ref 65–99)
Glucose-Capillary: 114 mg/dL — ABNORMAL HIGH (ref 65–99)

## 2016-03-07 LAB — BLOOD GAS, ARTERIAL
ACID-BASE EXCESS: 3.4 mmol/L — AB (ref 0.0–2.0)
Bicarbonate: 26.6 mmol/L (ref 20.0–28.0)
DRAWN BY: 358491
O2 Content: 4 L/min
O2 Saturation: 91.1 %
PH ART: 7.457 — AB (ref 7.350–7.450)
Patient temperature: 103.2
pCO2 arterial: 39.4 mmHg (ref 32.0–48.0)
pO2, Arterial: 73.5 mmHg — ABNORMAL LOW (ref 83.0–108.0)

## 2016-03-07 LAB — LACTIC ACID, PLASMA
LACTIC ACID, VENOUS: 2.5 mmol/L — AB (ref 0.5–1.9)
LACTIC ACID, VENOUS: 1.5 mmol/L (ref 0.5–1.9)
Lactic Acid, Venous: 1.5 mmol/L (ref 0.5–1.9)
Lactic Acid, Venous: 1.5 mmol/L (ref 0.5–1.9)

## 2016-03-07 LAB — PROTIME-INR
INR: 1.39
Prothrombin Time: 17.1 seconds — ABNORMAL HIGH (ref 11.4–15.2)

## 2016-03-07 LAB — HEPATITIS B SURFACE ANTIGEN: Hepatitis B Surface Ag: NEGATIVE

## 2016-03-07 LAB — PROCALCITONIN: PROCALCITONIN: 7.83 ng/mL

## 2016-03-07 MED ORDER — METOPROLOL TARTRATE 5 MG/5ML IV SOLN: 5.0000 mg | Freq: Four times a day (QID) | INTRAVENOUS | Status: DC | PRN

## 2016-03-07 MED ORDER — SODIUM CHLORIDE 0.9 % IV BOLUS (SEPSIS)
500.0000 mL | Freq: Once | INTRAVENOUS | Status: AC
  Administered 2016-03-07: 500 mL via INTRAVENOUS

## 2016-03-07 MED ORDER — DEXTROSE 5 % IV SOLN
2.0000 g | Freq: Once | INTRAVENOUS | Status: AC
  Administered 2016-03-07: 2 g via INTRAVENOUS
  Filled 2016-03-07: qty 2

## 2016-03-07 MED ORDER — SODIUM CHLORIDE 0.9 % IV SOLN
0.0000 ug/min | INTRAVENOUS | Status: DC
  Administered 2016-03-07: 30 ug/min via INTRAVENOUS
  Administered 2016-03-08: 35 ug/min via INTRAVENOUS
  Administered 2016-03-08: 17 ug/min via INTRAVENOUS
  Filled 2016-03-07 (×4): qty 1

## 2016-03-07 MED ORDER — CEFEPIME HCL 1 G IJ SOLR
1.0000 g | INTRAMUSCULAR | Status: AC
  Administered 2016-03-08 – 2016-03-13 (×6): 1 g via INTRAVENOUS
  Filled 2016-03-07 (×6): qty 1

## 2016-03-07 MED ORDER — SODIUM CHLORIDE 0.9 % IV BOLUS (SEPSIS): 250.0000 mL | Freq: Once | INTRAVENOUS | Status: AC

## 2016-03-07 MED ORDER — VANCOMYCIN HCL 10 G IV SOLR
1500.0000 mg | Freq: Once | INTRAVENOUS | Status: AC
  Administered 2016-03-07: 1500 mg via INTRAVENOUS
  Filled 2016-03-07: qty 1500

## 2016-03-07 MED ORDER — ACETAMINOPHEN 650 MG RE SUPP
325.0000 mg | RECTAL | Status: DC | PRN
  Administered 2016-03-07: 325 mg via RECTAL
  Filled 2016-03-07: qty 1

## 2016-03-07 MED ORDER — VANCOMYCIN HCL IN DEXTROSE 750-5 MG/150ML-% IV SOLN: 750.0000 mg | INTRAVENOUS | Status: DC

## 2016-03-07 MED ORDER — REGADENOSON 0.4 MG/5ML IV SOLN
0.4000 mg | Freq: Once | INTRAVENOUS | Status: AC
  Filled 2016-03-07 (×3): qty 5

## 2016-03-07 NOTE — Progress Notes (Signed)
Patient ID: BECKETT VECCHIARELLI, female   DOB: 03/07/1955, 61 y.o.   MRN: [id number]   Progress Note  Patient Name: [name] Date of Encounter: 03/07/2016  Primary Cardiologist: [name]   Subjective   No chest pain lethargic IV team working on better access   Inpatient Medications    Scheduled Meds: . calcitRIOL  0.25 mcg Oral Q T,Th,Sa-HD  . calcium acetate  667 mg Oral TID WC  . [START ON 03/08/2016] ceFEPime (MAXIPIME) IV  1 g Intravenous Q24H  . ceFEPime (MAXIPIME) IV  2 g Intravenous Once  . darbepoetin (ARANESP) injection - DIALYSIS  60 mcg Intravenous Q Thu-HD  . gabapentin  300 mg Oral QPM  . insulin aspart  0-5 Units Subcutaneous QHS  . insulin aspart  0-9 Units Subcutaneous TID WC  . insulin detemir  5 Units Subcutaneous QHS  . levothyroxine  175 mcg Oral QAC breakfast  . metoprolol tartrate  50 mg Oral TID  . multivitamin  1 tablet Oral QHS  . regadenoson  0.4 mg Intravenous Once  . senna-docusate  1 tablet Oral Daily  . sodium chloride flush  3 mL Intravenous Q12H  . tacrolimus  2 mg Oral BID  . vancomycin  1,500 mg Intravenous Once  . [START ON 03/08/2016] vancomycin  750 mg Intravenous Q T,Th,Sa-HD   Continuous Infusions: . heparin 1,250 Units/hr (03/06/16 1934)   PRN Meds: acetaminophen, acetaminophen, morphine injection, ondansetron (ZOFRAN) IV, ondansetron, oxyCODONE   Vital Signs    Vitals:   03/06/16 1951 03/06/16 2300 03/07/16 0500 03/07/16 0723  BP: (!) 114/50 (!) 134/46  (!) 154/66  Pulse: (!) 102 92  (!) 107  Resp: 13 19  14   Temp: 99.6 F (37.6 C) 98.5 F (36.9 C) 100.2 F (37.9 C) (!) 103.2 F (39.6 C)  TempSrc: Oral  Oral Oral  SpO2: 100% 93%  91%  Weight:      Height:        Intake/Output Summary (Last 24 hours) at 03/07/16 0810 Last data filed at 03/06/16 2300  Gross per 24 hour  Intake              480 ml  Output              800 ml  Net             -320 ml   Filed Weights   03/06/16 0500 03/06/16 0950 03/06/16 1345  Weight:  142 lb (64.4 kg) 141 lb 8.6 oz (64.2 kg) 139 lb 12.4 oz (63.4 kg)    Telemetry    Flutter rate 109  ECG    Flutter LVH  - Personally Reviewed  Physical Exam   Lethargic  Chronically ill black female  HEENT: normal Neck supple with no adenopathy JVP normal no bruits no thyromegaly Lungs clear with no wheezing and good diaphragmatic motion Heart:  [id number] click SRM AR murmur, no rub, gallop or click PMI normal Abdomen: benighn, post liver transplant  no bruit.  No HSM or HJR Poor distal pulses Failed dialysis grafts UE;s access fistula left groin No edema Neuro non-focal Ulcer left heal No muscular weakness   Labs    Chemistry  Recent Labs Lab 03/05/16 0927 03/06/16 0248 03/07/16 0425  NA 134* 136 133*  K 5.2* 6.6* 4.5  CL 94* 95* 94*  CO2 25 26 27   GLUCOSE 167* 139* 131*  BUN 43* 56* 28*  CREATININE 8.11* 9.34* 6.49*  CALCIUM 8.3* 8.3* 8.4*  PROT  --   --  5.8*  ALBUMIN  --   --  1.9*  AST  --   --  33  ALT  --   --  16  ALKPHOS  --   --  314*  BILITOT  --   --  3.0*  GFRNONAA 5* 4* 6*  GFRAA 5* 5* 7*  ANIONGAP 15 15 12      Hematology  Recent Labs Lab 03/05/16 0927 03/06/16 0248 03/07/16 0425  WBC 17.2* 15.2* 16.0*  RBC 3.10* 3.35* 2.60*  HGB 9.8* 10.9* 8.2*  HCT 30.1* 32.3* 25.1*  MCV 97.1 96.4 96.5  MCH 31.6 32.5 31.5  MCHC 32.6 33.7 32.7  RDW 16.4* 16.0* 16.1*  PLT 158 240 157    Cardiac Enzymes  Recent Labs Lab 03/05/16 1529 03/05/16 2122 03/06/16 0248  TROPONINI 1.38* 2.19* 3.48*     Recent Labs Lab 03/05/16 0942  TROPIPOC 0.65*     BNPNo results for input(s): BNP, PROBNP in the last 168 hours.   DDimer No results for input(s): DDIMER in the last 168 hours.   Radiology    Dg Chest 2 View  Result Date: 03/05/2016 CLINICAL DATA:  Diabetes, coronary artery disease, liver transplant patient, history of asthma. No reported chest complaints. The patient is complaining of foot pain. EXAM: CHEST  2 VIEW COMPARISON:  PA and  lateral chest x-ray of November 26, 2015 FINDINGS: The lungs are adequately inflated. There is a trace of pleural fluid at the left lung base. The interstitial markings are mildly prominent though stable. The heart is top-normal in size. There is a prosthetic aortic valve in place. The sternal wires are intact. The pulmonary vascularity is not engorged. There is calcification in the wall of the aortic arch. The bony thorax exhibits no acute abnormality. IMPRESSION: Small left pleural effusion. Mild interstitial prominence which is not entirely new. No alveolar pneumonia nor CHF. Thoracic aortic atherosclerosis. Electronically Signed   By: David  Martinique M.D.   On: 03/05/2016 08:50    Cardiac Studies   Echo: 10/19 EF 55-60% AVR ok mild AR   Patient Profile     61 y.o. female admitted with left leg pain and elevated troponin. History of CABG  Cath 2016 with patent LIMA to LAD. S/P liver transplant with CRF. May need revision Of fistula in left leg to improve flow to left foot  Assessment & Plan    Chest Pain : resolved no acute ECG changes troponin 3  Range but not clear of significance In setting renal failure. Had no other native disease on cath 2016 and LIMA was patent to LAD Given possible need for vascular surgery will plan lexiscan myovue for am .   Flutter: better rate control holding coumadin on heparin likely need for vascular surgery   CRF: ? Dialysis today renal/primary service to Rx hyperkalemia possible change access to RFA To improve LLE flow and ligate existing fistula Would need catheter access  AVR:  F/u echo known mild AR but systolic gradients ok r/o SOE since INR sub Rx on admission    She is lethargic this am have tentatively scheduled myovue for am    Signed, Jenkins Rouge, MD  03/07/2016, 8:10 AM   Patient ID: [name], female   DOB: [birth year], 61 y.o.   MRN: [id number]

## 2016-03-07 NOTE — Progress Notes (Signed)
Patient's heart rate 140's, temperature =102.8 BP=85/46.  Dr.  Cruzita Lederer notified.  Order for 250cc bolus obtained.  Will continue to monitor

## 2016-03-07 NOTE — Progress Notes (Addendum)
[facility] Progress Note   Subjective:  No c/o, groggy. CXR today shows pulm edema vs infection, last CXR 2 days ago was clear. Wts are up 4-5 kg.  BP's dropping and febrile. CCM consulted.   Vitals:   03/07/16 0723 03/07/16 1151 03/07/16 1152 03/07/16 1154  BP: (!) 154/66 (!) 81/57 (!) 85/46   Pulse: (!) 107     Resp: 14     Temp: (!) 103.2 F (39.6 C)   (!) 102.8 F (39.3 C)  TempSrc: Oral   Oral  SpO2: 91%  97%   Weight:      Height:        Inpatient medications: . calcitRIOL  0.25 mcg Oral Q T,Th,Sa-HD  . calcium acetate  667 mg Oral TID WC  . [START ON 03/08/2016] ceFEPime (MAXIPIME) IV  1 g Intravenous Q24H  . darbepoetin (ARANESP) injection - DIALYSIS  60 mcg Intravenous Q Thu-HD  . gabapentin  300 mg Oral QPM  . insulin aspart  0-5 Units Subcutaneous QHS  . insulin aspart  0-9 Units Subcutaneous TID WC  . insulin detemir  5 Units Subcutaneous QHS  . levothyroxine  175 mcg Oral QAC breakfast  . metoprolol tartrate  50 mg Oral TID  . multivitamin  1 tablet Oral QHS  . [START ON 03/08/2016] regadenoson  0.4 mg Intravenous Once  . senna-docusate  1 tablet Oral Daily  . sodium chloride flush  3 mL Intravenous Q12H  . tacrolimus  2 mg Oral BID  . [START ON 03/08/2016] vancomycin  750 mg Intravenous Q T,Th,Sa-HD   . heparin 1,400 Units/hr (03/07/16 1055)   acetaminophen, acetaminophen, morphine injection, ondansetron (ZOFRAN) IV, ondansetron, oxyCODONE  Exam: General: alert / chronically ill AAF / NAD / OX3 HEENT: [place] / EOMI/ Nonicteric  Neck: supple , no jvd Heart: RRR on tel. mech clinking / no rub or gal.  Lungs: cta  Abdomen: bs pos soft nt ,nd Extremities: L trace pedal edema Skin: Bilat Toes Ischemic appearance L>R / L heal dried ulcer/  Neuro: alert / moves all extrem . No overt focal deficits  Dialysis Access: Pos bruit L FEM AVGG  Dialysis Orders: Center: [facility]   on MWF . EDW 59.5  HD [facility]   Time 3hrs 75min/ Heparin no/ . Access L fem AVGG      Po calcitriol 0.5mcg /HD  MIrcera 60 mcg  q 2wks   *was to start 03/05/16" Other op labs hgb 9.8  Ca 9.3 phos 8.7  PTH 1294  Assessment: 1. Fever / hypotension - bp's dropping today and less repsonsive, may be septic. CXR w new infiltrates today which could be infection vs vol excess. Have d/w primary, unclear cause of new fever and hypotension. Abx IV started.  CCM consulted. Pt is DNI/ DNR, not a candidate for CRRT.  Not stable for HD today. Will reassess in am.  Have d/w CCM and primary MD.   2. ESRD - on HD MWF, had HD yesterday.  3. A. Flutter with RVR= resolved with B blk -card following /anticoags/ 4. PAD/ L lower extrem pain= per primary 5. Chest Pain / CAD ho / AOR - card consulted "demand ischemia " 6. Hypertension/volume - bp's low today, wts up 4kg now 7. Anemia  - hgb 9.8  Start ESA with HD  8. Metabolic bone disease -  Noted OP Phos and PTH ^ with complaince / continue Vit d  And  phoslo  As binder 9. HO Liver transplant sec  Hep c ( blood transfusion induced) - on meds 10. DM  Type 2 - per admit  11. COPD  Plan - unable to dialyze in her current condition. See above.    [name] MD [facility] pager [phone]   03/07/2016, 1:16 PM    Recent Labs Lab 03/05/16 0927 03/06/16 0248 03/07/16 0425  NA 134* 136 133*  K 5.2* 6.6* 4.5  CL 94* 95* 94*  CO2 25 26 27   GLUCOSE 167* 139* 131*  BUN 43* 56* 28*  CREATININE 8.11* 9.34* 6.49*  CALCIUM 8.3* 8.3* 8.4*    Recent Labs Lab 03/07/16 0425  AST 33  ALT 16  ALKPHOS 314*  BILITOT 3.0*  PROT 5.8*  ALBUMIN 1.9*    Recent Labs Lab 03/05/16 0927 03/06/16 0248 03/07/16 0425  WBC 17.2* 15.2* 16.0*  HGB 9.8* 10.9* 8.2*  HCT 30.1* 32.3* 25.1*  MCV 97.1 96.4 96.5  PLT 158 240 157   Iron/TIBC/Ferritin/ %Sat    Component Value Date/Time   FERRITIN 1,095 (H) 08/30/2012 1449

## 2016-03-07 NOTE — Progress Notes (Signed)
PROGRESS NOTE  [name] [id number] DOB: 01-24-1956 DOA: 03/05/2016 PCP: No PCP Per Patient   LOS: 1 day   Brief Narrative: Patient is a 61 y.o.femalewho presents with chronic LE pain likely from PAD and neuropathy from chronic poorly controlled DM and with CP concerning for ACS  Assessment & Plan: Principal Problem:   Chest pain Active Problems:   Essential hypertension   GERD   End stage renal disease on dialysis-TTS   CAD- s/p LIMA-LAD Feb. 2015   COPD (chronic obstructive pulmonary disease) (HCC)   Paroxysmal supraventricular tachycardia, s/p valve replacement, with chest pain. resolved as outpatient   DM type 2 (diabetes mellitus, type 2) ([place])   S/P liver transplant ([place])   Chronic hepatitis C without hepatic coma (HCC)   Diastolic CHF, chronic (HCC)   Elevated troponin   Leg pain   Chest pain due to myocardial ischemia [facility])   Claudication of left lower extremity ([place])   Dialysis patient ([place])   Diabetes mellitus with complication ([place])   Fever / concern for sepsis - Patient febrile this morning, tachycardic, and initially normotensive however her blood pressure has been running low in the last couple hours. She had a one-time temperature on admission, however she has been afebrile throughout the day yesterday. Obtain blood cultures, start empiric vancomycin and cefepime, provide small fluid bolus for hypotension. Obtain chest x-ray, cannot get urinalysis seizure is not making any urine. Given hypotension, I consulted PCCM to evaluate need for ICU level of care as she likely needs pressors given poor mentation  Chest pain / CAD - Patient with extended history of coronary artery disease, presented 1/24 with chest pain. Troponin has been elevated. Patient is on heparin drip per cardiology - stress test cancelled due to #1  SVT - off of metoprolol, hypotensive today. Fluids and monitor.   Hypothyroidism - Patient on Synthroid, her last TSH checked as an  outpatient last year was elevated at 17, and it appears that her Synthroid dose was increased. Repeat TSH now is 4.8, continue  Chronic bilateral lower extremity pain, left worse than the right - Last surgery consulted, appreciate input.  Mild COPD - No breathing, this is stable, no wheezing  Insulin-dependent diabetes mellitus - Continue Levemir and sliding scale  ESRD - Nephrology consulted, appreciate input, on dialysis  Chronic immunosuppression in the setting of liver transplantation - Continue Prograf    DVT prophylaxis: heparin Code Status: no CPR, no intubation. Daughter at bedside confirms Family Communication: d/w daughter bedside Disposition Plan: TBD  Consultants:   Cardiology   Vascular surgery   PCCM  Procedures:   None   Antimicrobials:  Vancomycin 1/26 >>  Cefepime 1/26 >>   Subjective: - lethargic, wakes up when prompted, answers basic questions, appears confused  Objective: Vitals:   03/07/16 0723 03/07/16 1151 03/07/16 1152 03/07/16 1154  BP: (!) 154/66 (!) 81/57 (!) 85/46   Pulse: (!) 107     Resp: 14     Temp: (!) 103.2 F (39.6 C)   (!) 102.8 F (39.3 C)  TempSrc: Oral   Oral  SpO2: 91%  97%   Weight:      Height:        Intake/Output Summary (Last 24 hours) at 03/07/16 1159 Last data filed at 03/06/16 2300  Gross per 24 hour  Intake              480 ml  Output  800 ml  Net             -320 ml   Filed Weights   03/06/16 0500 03/06/16 0950 03/06/16 1345  Weight: 64.4 kg (142 lb) 64.2 kg (141 lb 8.6 oz) 63.4 kg (139 lb 12.4 oz)    Examination: Constitutional: NAD Vitals:   03/07/16 0723 03/07/16 1151 03/07/16 1152 03/07/16 1154  BP: (!) 154/66 (!) 81/57 (!) 85/46   Pulse: (!) 107     Resp: 14     Temp: (!) 103.2 F (39.6 C)   (!) 102.8 F (39.3 C)  TempSrc: Oral   Oral  SpO2: 91%  97%   Weight:      Height:       Eyes: lids and conjunctivae normal Respiratory: no wheezing, mostly clear, shallow  breathing  Cardiovascular: Regular rate and rhythm, no murmurs / rubs / gallops. No LE edema. Left foot pain. Decreased pulses Abdomen: no tenderness. Bowel sounds positive.  Musculoskeletal: Dark discoloration left foot. Unstageable pressure ulcer on the left calcaneus.  Neurologic: non focal    Data Reviewed: I have personally reviewed following labs and imaging studies  CBC:  Recent Labs Lab 03/05/16 0927 03/06/16 0248 03/07/16 0425  WBC 17.2* 15.2* 16.0*  HGB 9.8* 10.9* 8.2*  HCT 30.1* 32.3* 25.1*  MCV 97.1 96.4 96.5  PLT 158 240 [id number]   Basic Metabolic Panel:  Recent Labs Lab 03/05/16 0927 03/06/16 0248 03/07/16 0425  NA 134* 136 133*  K 5.2* 6.6* 4.5  CL 94* 95* 94*  CO2 25 26 27   GLUCOSE 167* 139* 131*  BUN 43* 56* 28*  CREATININE 8.11* 9.34* 6.49*  CALCIUM 8.3* 8.3* 8.4*   GFR: Estimated Creatinine Clearance: 7.9 mL/min (by C-G formula based on SCr of 6.49 mg/dL (H)). Liver Function Tests:  Recent Labs Lab 03/07/16 0425  AST 33  ALT 16  ALKPHOS 314*  BILITOT 3.0*  PROT 5.8*  ALBUMIN 1.9*   No results for input(s): LIPASE, AMYLASE in the last 168 hours. No results for input(s): AMMONIA in the last 168 hours. Coagulation Profile:  Recent Labs Lab 03/05/16 0927 03/07/16 0852  INR 1.81 1.39   Cardiac Enzymes:  Recent Labs Lab 03/05/16 1529 03/05/16 2122 03/06/16 0248  TROPONINI 1.38* 2.19* 3.48*   BNP (last 3 results) No results for input(s): PROBNP in the last 8760 hours. HbA1C:  Recent Labs  03/05/16 1529  HGBA1C 7.5*   CBG:  Recent Labs Lab 03/06/16 1617 03/06/16 2130 03/07/16 0111 03/07/16 0722 03/07/16 1106  GLUCAP 90 78 134* 106* 99   Lipid Profile: No results for input(s): CHOL, HDL, LDLCALC, TRIG, CHOLHDL, LDLDIRECT in the last 72 hours. Thyroid Function Tests:  Recent Labs  03/06/16 0853  TSH 4.836*   Anemia Panel: No results for input(s): VITAMINB12, FOLATE, FERRITIN, TIBC, IRON, RETICCTPCT in the last 72  hours. Urine analysis:    Component Value Date/Time   COLORURINE YELLOW 11/02/2012 0428   APPEARANCEUR CLOUDY (A) 11/02/2012 0428   LABSPEC 1.011 11/02/2012 0428   PHURINE 8.0 11/02/2012 0428   GLUCOSEU 250 (A) 11/02/2012 0428   HGBUR LARGE (A) 11/02/2012 0428   BILIRUBINUR NEGATIVE 11/02/2012 0428   KETONESUR NEGATIVE 11/02/2012 0428   PROTEINUR >300 (A) 11/02/2012 0428   UROBILINOGEN 0.2 11/02/2012 0428   NITRITE NEGATIVE 11/02/2012 0428   LEUKOCYTESUR NEGATIVE 11/02/2012 0428   Sepsis Labs: Invalid input(s): PROCALCITONIN, LACTICIDVEN  Recent Results (from the past 240 hour(s))  MRSA PCR Screening     Status:  None   Collection Time: 03/05/16  6:37 PM  Result Value Ref Range Status   MRSA by PCR NEGATIVE NEGATIVE Final    Comment:        The GeneXpert MRSA Assay (FDA approved for NASAL specimens only), is one component of a comprehensive MRSA colonization surveillance program. It is not intended to diagnose MRSA infection nor to guide or monitor treatment for MRSA infections.   Culture, blood (routine x 2)     Status: None (Preliminary result)   Collection Time: 03/06/16  5:48 AM  Result Value Ref Range Status   Specimen Description BLOOD LEFT HAND  Final   Special Requests BOTTLES DRAWN AEROBIC AND ANAEROBIC 10CC EA  Final   Culture NO GROWTH 1 DAY  Final   Report Status PENDING  Incomplete  Culture, blood (routine x 2)     Status: None (Preliminary result)   Collection Time: 03/06/16  5:54 AM  Result Value Ref Range Status   Specimen Description BLOOD RIGHT HAND  Final   Special Requests BOTTLES DRAWN AEROBIC AND ANAEROBIC 10CC EA  Final   Culture NO GROWTH 1 DAY  Final   Report Status PENDING  Incomplete      Radiology Studies: Dg Chest Port 1 View  Result Date: 03/07/2016 CLINICAL DATA:  Fever, hypoxemia. EXAM: PORTABLE CHEST 1 VIEW COMPARISON:  Radiographs of March 05, 2016. FINDINGS: Stable cardiomediastinal silhouette. Status post aortic valve  repair. Atherosclerosis of thoracic aorta is noted. No pneumothorax is noted. Interval development of diffuse interstitial densities throughout both lungs concerning for edema or possibly pneumonia. Mild left pleural effusion is noted. Bony thorax is unremarkable. IMPRESSION: Aortic atherosclerosis. Diffuse interstitial densities are noted throughout both lungs concerning for edema or possibly pneumonia. Mild left pleural effusion. Electronically Signed   By: Marijo Conception, M.D.   On: 03/07/2016 09:29   Dg Foot 2 Views Left  Result Date: 03/07/2016 CLINICAL DATA:  Left foot tenderness. EXAM: LEFT FOOT - 2 VIEW COMPARISON:  None. FINDINGS: Extensive vascular calcifications throughout the foot. Plantar and posterior calcaneal spurs. No radiographic changes of osteomyelitis. No fracture, subluxation or dislocation. IMPRESSION: No acute bony abnormality.  Extensive vascular calcifications. Electronically Signed   By: Rolm Baptise M.D.   On: 03/07/2016 09:27     Scheduled Meds: . calcitRIOL  0.25 mcg Oral Q T,Th,Sa-HD  . calcium acetate  667 mg Oral TID WC  . [START ON 03/08/2016] ceFEPime (MAXIPIME) IV  1 g Intravenous Q24H  . darbepoetin (ARANESP) injection - DIALYSIS  60 mcg Intravenous Q Thu-HD  . gabapentin  300 mg Oral QPM  . insulin aspart  0-5 Units Subcutaneous QHS  . insulin aspart  0-9 Units Subcutaneous TID WC  . insulin detemir  5 Units Subcutaneous QHS  . levothyroxine  175 mcg Oral QAC breakfast  . metoprolol tartrate  50 mg Oral TID  . multivitamin  1 tablet Oral QHS  . [START ON 03/08/2016] regadenoson  0.4 mg Intravenous Once  . senna-docusate  1 tablet Oral Daily  . sodium chloride  250 mL Intravenous Once  . sodium chloride flush  3 mL Intravenous Q12H  . tacrolimus  2 mg Oral BID  . vancomycin  1,500 mg Intravenous Once  . [START ON 03/08/2016] vancomycin  750 mg Intravenous Q T,Th,Sa-HD   Continuous Infusions: . heparin 1,400 Units/hr (03/07/16 1055)     Marzetta Board, MD, PhD [facility] Pager [phone]  If 7PM-7AM, please contact night-coverage [email or web address] Password [email or web address]  03/07/2016, 11:59 AM

## 2016-03-07 NOTE — Care Management Note (Signed)
Case Management Note  Patient Details  Name: [name] MRN: [id number] Date of Birth: July 14, 1955  Subjective/Objective: Pt presented for chest pain. Pt with hx of chronic LE pain from PAD and Neuropathy from poorly controlled Diabetes. Pt is from home with daughter. Pt will benefit from PT evaluation for disposition needs.                    Action/Plan: CM will continue to monitor for additional needs.   Expected Discharge Date:                  Expected Discharge Plan:  [place]  In-House Referral:     Discharge planning Services  CM Consult  Post Acute Care Choice:    Choice offered to:     DME Arranged:    DME Agency:     HH Arranged:    [place] Agency:     Status of Service:  In process, will continue to follow  If discussed at Long Length of Stay Meetings, dates discussed:    Additional Comments:  Bethena Roys, RN 03/07/2016, 2:28 PM

## 2016-03-07 NOTE — Progress Notes (Signed)
ANTICOAGULATION CONSULT NOTE - Follow-up Consult  Pharmacy Consult for heparin Indication: atrial flutter, AVR   Allergies  Allergen Reactions  . Acetaminophen Other (See Comments)    Liver transplant recipient   . Codeine Itching  . Mirtazapine Other (See Comments)    hallucination  . Morphine Itching and Other (See Comments)    hallucinate    Patient Measurements: Height: 5\' 4"  (162.6 cm) Weight:  (unable to get wt) IBW/kg (Calculated) : 54.7 Heparin Dosing Weight: 59 kg  Vital Signs: Temp: 103.2 F (39.6 C) (01/26 0723) Temp Source: Oral (01/26 0723) BP: 154/66 (01/26 0723) Pulse Rate: 107 (01/26 0723)  Labs:  Recent Labs  03/05/16 [id number] 03/05/16 1529 03/05/16 2122  03/06/16 0248 03/06/16 0853 03/06/16 1828 03/07/16 0425 03/07/16 0852  HGB 9.8*  --   --   --  10.9*  --   --  8.2*  --   HCT 30.1*  --   --   --  32.3*  --   --  25.1*  --   PLT 158  --   --   --  240  --   --  157  --   LABPROT 21.2*  --   --   --   --   --   --   --  17.1*  INR 1.81  --   --   --   --   --   --   --  1.39  HEPARINUNFRC  --   --   --   < >  --  0.22* 0.22*  --  0.20*  CREATININE 8.11*  --   --   --  9.34*  --   --  6.49*  --   TROPONINI  --  1.38* 2.19*  --  3.48*  --   --   --   --   < > = values in this interval not displayed.  Estimated Creatinine Clearance: 7.9 mL/min (by C-G formula based on SCr of 6.49 mg/dL (H)).  Assessment: Pharmacy consulted to dose heparin bridge for atrial flutter and AVR.  Pt on coumadin PTA per coumadin clinic.  Home dose is 2 mg daily, last dose 1/23. Last coumadin clinic visit 02/29/16 with INR 2.7,  INR goal 2.5 - 3 per clinic notes.  Gets INRs drawn at HD center.  INR subtherapeutic (1.81) on admission.  Heparin level remains subtherapeutic at 0.2  Goal of Therapy:  Heparin level 0.3-0.7 units/ml; INR 2.5-3 Monitor platelets by anticoagulation protocol: Yes   Plan: Heparin infusion to 1400 units / hr 8 hr heparin level Daily heparin  level and CBC.  Thank you Anette Guarneri, PharmD [phone]  03/07/2016 9:48 AM

## 2016-03-07 NOTE — Progress Notes (Signed)
Patient's BP still in 80's and HR in 130's.  Hayden Pedro NP notified.

## 2016-03-07 NOTE — Progress Notes (Signed)
Pharmacy Antibiotic Note  [name] is a 61 y.o. female admitted on 03/05/2016 with PAD, neuropathy, and r/o ACS, now w/ concern for sepsis.  Pharmacy has been consulted for vancomycin and cefepime dosing.  Plan: Vancomycin 1500mg  IV x1 then 750mg  IV after each HD for goal pre-HD level 15-25. Cefepime 2g IV x1 then 1g Q24H.  Height: 5\' 4"  (162.6 cm) Weight:  (unable to get wt) IBW/kg (Calculated) : 54.7  Temp (24hrs), Avg:100.1 F (37.8 C), Min:98.5 F (36.9 C), Max:103.2 F (39.6 C)   Recent Labs Lab 03/05/16 0927 03/06/16 0248 03/07/16 0425  WBC 17.2* 15.2* 16.0*  CREATININE 8.11* 9.34* 6.49*    Estimated Creatinine Clearance: 7.9 mL/min (by C-G formula based on SCr of 6.49 mg/dL (H)).    Allergies  Allergen Reactions  . Acetaminophen Other (See Comments)    Liver transplant recipient   . Codeine Itching  . Mirtazapine Other (See Comments)    hallucination  . Morphine Itching and Other (See Comments)    hallucinate    Thank you for allowing pharmacy to be a part of this patient's care.  Wynona Neat, PharmD, BCPS  03/07/2016 7:44 AM

## 2016-03-07 NOTE — Progress Notes (Signed)
RN notified by NT Katie that O2 sats = 88% on 2L.  Patient repositions and O2 increased to 3 L, sats not above 90%.  Oral temperature obtained=103.2.  Patient lethargic, did respond to name and was able to state where she is.  Dr Cruzita Lederer notified, he stated he would come see patient.

## 2016-03-07 NOTE — Progress Notes (Signed)
ANTICOAGULATION CONSULT NOTE - Follow-up Consult  Pharmacy Consult for heparin Indication: atrial flutter, AVR   Allergies  Allergen Reactions  . Acetaminophen Other (See Comments)    Liver transplant recipient   . Codeine Itching  . Mirtazapine Other (See Comments)    hallucination  . Morphine Itching and Other (See Comments)    hallucinate    Patient Measurements: Height: 5\' 4"  (162.6 cm) Weight: 139 lb 15.9 oz (63.5 kg) IBW/kg (Calculated) : 54.7 Heparin Dosing Weight: 59 kg  Vital Signs: Temp: 98.8 F (37.1 C) (01/26 2010) Temp Source: Oral (01/26 2010) BP: 89/48 (01/26 1900) Pulse Rate: 78 (01/26 1900)  Labs:  Recent Labs  03/05/16 0927 03/05/16 1529 03/05/16 2122  03/06/16 0248  03/06/16 1828 03/07/16 0425 03/07/16 0852 03/07/16 1905  HGB 9.8*  --   --   --  10.9*  --   --  8.2*  --   --   HCT 30.1*  --   --   --  32.3*  --   --  25.1*  --   --   PLT 158  --   --   --  240  --   --  157  --   --   LABPROT 21.2*  --   --   --   --   --   --   --  17.1*  --   INR 1.81  --   --   --   --   --   --   --  1.39  --   HEPARINUNFRC  --   --   --   < >  --   < > 0.22*  --  0.20* 0.33  CREATININE 8.11*  --   --   --  9.34*  --   --  6.49*  --   --   TROPONINI  --  1.38* 2.19*  --  3.48*  --   --   --   --   --   < > = values in this interval not displayed.  Estimated Creatinine Clearance: 7.9 mL/min (by C-G formula based on SCr of 6.49 mg/dL (H)).  Assessment: Pharmacy consulted to dose heparin bridge for atrial flutter and AVR. Pt on coumadin PTA per coumadin clinic. Home dose is 2 mg daily, last dose 1/23. Last coumadin clinic visit 02/29/16 with INR 2.7, INR goal 2.5 - 3 per clinic notes. Gets INRs drawn at HD center. INR subtherapeutic (1.81) on admission, 1.39 today.  Heparin level now therapeutic at 0.33 on heparin at 1400 units/h. Hg down 8.2, plt wnl. No bleed documented.  Goal of Therapy:  Heparin level 0.3-0.7 units/ml; INR 2.5-3 Monitor platelets by  anticoagulation protocol: Yes   Plan: Heparin infusion to 1400 units / hr 8 hr heparin level to confirm Daily heparin level and CBC Monitor for s/sx bleeding Warfarin on hold   Elicia Lamp, PharmD, BCPS Clinical Pharmacist 03/07/2016 8:18 PM

## 2016-03-07 NOTE — Progress Notes (Signed)
Progress Note   To Bedside to Evaluate Patient. Patient Lethargic, Hypotensive, Tachycardiac.   Vitals:   03/07/16 1552 03/07/16 1706  BP: (!) 81/54 (!) 72/52  Pulse: (!) 134   Resp: (!) 0 (!) 3  Temp:     PHYSICAL EXAMINATION: General:  Adult female, lying in bed Neuro:  Lethargic, follows commands, easy to arouse  HEENT:  Normocephalic  Cardiovascular:  Tachy, aflutter, no MRG  Lungs: diminished to bases, unlabored breathing  Abdomen:  Non-tender, non-distended, active bowel sounds  Musculoskeletal:  No deformities  Skin:  Hot to touch, dry, intact   Septic Shock secondary to HCAP vs Bacteremia  Hypotensive, Tachycardia, Febrile  Plan Transfer to ICU  Cardiac Monitoring  Limited Medical Treatment - DNI/DNR  Start on PIV Neo with MAP goal > 60 or Systolic > 90   Hayden Pedro, [facility]  Pgr: [phone]  PCCM Pgr: [phone]

## 2016-03-07 NOTE — Consult Note (Signed)
[facility]   Subjective  - lethargic but answers questions   Objective (!) 81/54 (!) 134 (!) 100.4 F (38 C) (Oral) (!) 0 100%  Intake/Output Summary (Last 24 hours) at 03/07/16 1613 Last data filed at 03/06/16 2300  Gross per 24 hour  Intake              300 ml  Output                0 ml  Net              300 ml   Left foot unchanged dry ulcer at heel no fluctuance or evidence of invasive infection Thigh graft without erythema  Assessment/Planning: Possible early sepsis of unknown source. Doubt her foot is the source, no pointing signs for left thigh graft Pt immunosuppressed so numerous possibilties If she improves dramatically over the weekend will do ligation left thigh graft place new thigh graft and catheter on Monday otherwise will delay this until more stable  Dr Donzetta Matters will check on her on Sunday to see if she is ready for operation  Ruta Hinds 03/07/2016 4:13 PM --  Laboratory Lab Results:  Recent Labs  03/06/16 0248 03/07/16 0425  WBC 15.2* 16.0*  HGB 10.9* 8.2*  HCT 32.3* 25.1*  PLT 240 157   BMET  Recent Labs  03/06/16 0248 03/07/16 0425  NA 136 133*  K 6.6* 4.5  CL 95* 94*  CO2 26 27  GLUCOSE 139* 131*  BUN 56* 28*  CREATININE 9.34* 6.49*  CALCIUM 8.3* 8.4*    COAG Lab Results  Component Value Date   INR 1.39 03/07/2016   INR 1.81 03/05/2016   INR 2.7 (A) 02/25/2016   No results found for: PTT

## 2016-03-07 NOTE — Consult Note (Signed)
Name: [name] MRN: [id number] DOB: Dec 22, 1955    ADMISSION DATE:  03/05/2016 CONSULTATION DATE:  1/26  REFERRING MD : Dr. 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 Presents to ED on 1/24 from HD with A.Flutter HR of 180. Patient reported left foot pain in which she is being followed by vascular. Upon arrival to ED patient reports chest pain and had pos troponins as well as a small left pleural effusion on chest xray. Cardiology was consulted and believed that elevated trops were due to ischemic demand in setting of a possible infection. During stay patient has been febrile, lactic acid 1.5, and pending blood cultures. Patient was admitted to step-down under triad. On 1/26 patient became increasingly lethargic and hypotensive. PCCM was asked to consult.   On arrival to unit daughter was at bedside and verified with patient that she does not wish to be intubated, wear BIPAP, or have CPR. However, is okay with central line placement and vasoactive medications if needed.   SIGNIFICANT EVENTS  1/24 > Presented to ED with chest pain and aflutter  1/26 > PCCM consulted for hypotension   STUDIES:  1/24 > Left Lower Ext Duplex > Neg acute  1/26 > CXR > diffuse interstitial densities, edema vs pna   Microbiology:  Blood 1/25 >  Blood 1/26 >  Antibiotics:  Cefepime 1/26 > Vancomycin 1/26 >   PAST MEDICAL HISTORY :   has a past medical history of Anemia; Anxiety; Aortic stenosis; Arthritis; Asthma; CAD (coronary artery disease); CAD (coronary artery disease) (Jan. 2015); Chest pain (03/06/2015); Chronic back pain; Chronic bronchitis ([place]); Chronic constipation; COPD (chronic obstructive pulmonary disease) ([place]); Depression; End stage renal disease on dialysis [facility]) (02/27/2011); GERD (gastroesophageal reflux disease); Headache; Hepatitis C; History of  blood transfusion; Hypertension; Hypothyroidism; Migraine; Neuromuscular disorder ([place]); Obesity; Peripheral vascular disease (H[facility]) (hands and legs); Pneumonia; S/P aortic valve replacement (03/15/13); S/P liver transplant ([place]); SVT (supraventricular tachycardia) ([place]) (06/09/14); Tobacco abuse; and Type II diabetes mellitus ([place]).  has a past surgical history that includes Liver transplant (10/25/2009); Small intestine surgery (90's); Thrombectomy; Arteriovenous graft placement (Left, 10/03/10); Arteriovenous goretex graft removal (12/23/2010); Insertion of dialysis catheter (12/23/2010); Cholecystectomy (1993); Cystoscopy (1990's); Spinal growth rods (2010); AV fistula placement (01/29/2011); AV fistula placement (03/10/2011); Tubal ligation (1990's); Aortic valve replacement (N/A, 03/15/2013); Coronary artery bypass graft (N/A, 03/15/2013); Intraoprative transesophageal echocardiogram (N/A, 03/15/2013); left heart catheterization with coronary/graft angiogram (N/A, 12/24/2011); left heart catheterization with coronary angiogram (N/A, 07/29/2012); left heart catheterization with coronary angiogram (N/A, 03/10/2013); left heart catheterization with coronary/graft angiogram (N/A, 12/16/2013); Thrombectomy and revision of arterioventous (av) goretex  graft (Left, 03/30/2014); Thrombectomy and revision of arterioventous (av) goretex  graft (Left, 03/30/2014); shuntogram (Left, 05/15/2014); Cardiac catheterization (N/A, 11/06/2015); Cardiac catheterization (N/A, 11/06/2015); and Cardiac catheterization (11/06/2015). Prior to Admission medications   Medication Sig Start Date End Date Taking? Authorizing Provider  aspirin EC 81 MG tablet Take 81 mg by mouth daily.   Yes Historical Provider, MD  calcium acetate (PHOSLO) 667 MG capsule Take 1 capsule (667 mg total) by mouth 3 (three) times daily with meals. 10/16/15  Yes Orson Eva, MD  gabapentin (NEURONTIN) 300 MG capsule Take 300 mg by mouth every evening.   Yes Historical Provider, MD   Insulin Detemir (LEVEMIR FLEXPEN) 100 UNIT/ML Pen Inject 2-6 Units into the  skin See admin instructions. Only if cbg is over 200 12/27/15  Yes Historical Provider, MD  levothyroxine (SYNTHROID, LEVOTHROID) 175 MCG tablet Take 1 tablet (175 mcg total) by mouth daily before breakfast. 02/15/16  Yes Troy Sine, MD  metoprolol tartrate (LOPRESSOR) 25 MG tablet Take 25 mg by mouth 2 (two) times daily. 03/16/14  Yes Historical Provider, MD  ondansetron (ZOFRAN ODT) 4 MG disintegrating tablet Take 1 tablet (4 mg total) by mouth every 8 (eight) hours as needed for nausea. 08/13/15  Yes Tanna Furry, MD  oxyCODONE (OXY IR/ROXICODONE) 5 MG immediate release tablet Take 5 mg by mouth every 4 (four) hours as needed. 03/03/16  Yes Historical Provider, MD  senna-docusate (SENOKOT-S) 8.6-50 MG tablet Take 1 tablet by mouth daily.   Yes Historical Provider, MD  tacrolimus (PROGRAF) 1 MG capsule Take 2 capsules (2 mg total) by mouth 2 (two) times daily. Take 2 capsules twice daily. 11/29/15  Yes Maryann Mikhail, DO  warfarin (COUMADIN) 2 MG tablet Take 1- 2 tablets by mouth daily or as directed by coumadin clinic Patient taking differently: Take 2 mg by mouth daily. Take as directed by coumadin clinic 11/01/15  Yes Troy Sine, MD  nitroGLYCERIN (NITROSTAT) 0.4 MG SL tablet Place 1 tablet (0.4 mg total) under the tongue every 5 (five) minutes as needed for chest pain. 03/20/14   Albertine Patricia, MD   Allergies  Allergen Reactions  . Acetaminophen Other (See Comments)    Liver transplant recipient   . Codeine Itching  . Mirtazapine Other (See Comments)    hallucination  . Morphine Itching and Other (See Comments)    hallucinate    FAMILY HISTORY:  family history includes Cancer in her father and mother; Diabetes in her mother; Hypertension in her mother; Stroke in her mother. SOCIAL HISTORY:  reports that she has been smoking Cigarettes.  She has a 20.00 pack-year smoking history. She has never used smokeless  tobacco. She reports that she does not drink alcohol or use drugs.  REVIEW OF SYSTEMS:   All negative; except for those that are bolded, which indicate positives.  Constitutional: weight loss, weight gain, night sweats, fevers, chills, fatigue, weakness.  HEENT: headaches, sore throat, sneezing, nasal congestion, post nasal drip, difficulty swallowing, tooth/dental problems, visual complaints, visual changes, ear aches. Neuro: difficulty with speech, weakness, numbness, ataxia. CV:  chest pain, orthopnea, PND, swelling in lower extremities, dizziness, palpitations, syncope.  Resp: cough, hemoptysis, dyspnea, wheezing. GI: heartburn, indigestion, abdominal pain, nausea, vomiting, diarrhea, constipation, change in bowel habits, loss of appetite, hematemesis, melena, hematochezia.  GU: dysuria, change in color of urine, urgency or frequency, flank pain, hematuria. MSK: joint pain or swelling, decreased range of motion. Psych: change in mood or affect, depression, anxiety, suicidal ideations, homicidal ideations. Skin: rash, itching, bruising.  SUBJECTIVE:  Currently receiving 500 ml bolus, has become increasingly lethargic throughout day.   VITAL SIGNS: Temp:  [98.5 F (36.9 C)-103.2 F (39.6 C)] 102.8 F (39.3 C) (01/26 1154) Pulse Rate:  [83-107] 107 (01/26 0723) Resp:  [12-19] 14 (01/26 0723) BP: (81-154)/(46-88) 85/46 (01/26 1152) SpO2:  [91 %-100 %] 97 % (01/26 1152) Weight:  [63.4 kg (139 lb 12.4 oz)] 63.4 kg (139 lb 12.4 oz) (01/25 1345)  PHYSICAL EXAMINATION: General:  Adult female, lying in bed, no distress  Neuro:  Lethargic, easy to arouse, oriented, follows commands HEENT:  Normocephalic  Cardiovascular:  Tachy, aflutter, no MRG  Lungs:  Clear, diminished to bases, unlabored breathing  Abdomen:  Non-tender, non-distended, active bowel sounds  Musculoskeletal:  No deformities  Skin:  Hot to touch, dry, intact    Recent Labs Lab 03/05/16 0927 03/06/16 0248  03/07/16 0425  NA 134* 136 133*  K 5.2* 6.6* 4.5  CL 94* 95* 94*  CO2 25 26 27   BUN 43* 56* 28*  CREATININE 8.11* 9.34* 6.49*  GLUCOSE 167* 139* 131*    Recent Labs Lab 03/05/16 0927 03/06/16 0248 03/07/16 0425  HGB 9.8* 10.9* 8.2*  HCT 30.1* 32.3* 25.1*  WBC 17.2* 15.2* 16.0*  PLT 158 240 157   Dg Chest Port 1 View  Result Date: 03/07/2016 CLINICAL DATA:  Fever, hypoxemia. EXAM: PORTABLE CHEST 1 VIEW COMPARISON:  Radiographs of March 05, 2016. FINDINGS: Stable cardiomediastinal silhouette. Status post aortic valve repair. Atherosclerosis of thoracic aorta is noted. No pneumothorax is noted. Interval development of diffuse interstitial densities throughout both lungs concerning for edema or possibly pneumonia. Mild left pleural effusion is noted. Bony thorax is unremarkable. IMPRESSION: Aortic atherosclerosis. Diffuse interstitial densities are noted throughout both lungs concerning for edema or possibly pneumonia. Mild left pleural effusion. Electronically Signed   By: Marijo Conception, M.D.   On: 03/07/2016 09:29   Dg Foot 2 Views Left  Result Date: 03/07/2016 CLINICAL DATA:  Left foot tenderness. EXAM: LEFT FOOT - 2 VIEW COMPARISON:  None. FINDINGS: Extensive vascular calcifications throughout the foot. Plantar and posterior calcaneal spurs. No radiographic changes of osteomyelitis. No fracture, subluxation or dislocation. IMPRESSION: No acute bony abnormality.  Extensive vascular calcifications. Electronically Signed   By: Rolm Baptise M.D.   On: 03/07/2016 09:27    ASSESSMENT / PLAN:  Extensive conversation with family regarding code status, patient and family both agree that if her hear stops she does not wish for it to be restarted, and does not wish to have BiPAP or mechanical ventilation. If her hypotension persists would be okay with CVC placement and vasoactive medications.   Tachycardia secondary to septic shock Hypotension  H/O CAD, COPD Plan Maintain Saturation >  88 Maintain MAP > 60 May need ICU transfer for CVC and pressors - Currently BP maintaining  Hydrate gently given ESRD and low EF (given 542ml bolus total)  D/C Scheduled HTN medications, PRN metoprolol ordered   Septic Shock secondary to unknown etiology -febrile, hypotensive Plan Trend Lactic Acid and Procal  Continue Vancomycin and Cefepime  Follow Cultures  Tylenol PRN  Can Cool patient as tolerated    Hayden Pedro, AG-ACNP [facility]  Pgr: [phone]  PCCM Pgr: [phone]   ATTENDING NOTE / ATTESTATION NOTE :   I have discussed the case with the resident/APP Hayden Pedro NP. .   I agree with the resident/APP's  history, physical examination, assessment, and plans.    I have edited the above note and modified it according to our agreed history, physical examination, assessment and plan.   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 She was also complaining of chest pain and left thigh pain. She is a vasculopath. She has a fistula in her left thigh.  Patient is a DO NOT RESUSCITATE and DO NOT INTUBATE. Initial blood work showed elevated troponin and cardiology has been consulted. Plan initially was to do a stress test today. Patient remained tachycardic.  The last 12 hours, blood pressure started dropping, she continues to be febrile, noted to be somnolent today. PCCM consulted for further recommendations. By the time is to patient, she had got an 400 mL of saline. Blood pressure was 89/66, heart rate 130s.  Pt examined.   Vitals:  Vitals:   03/07/16 1152 03/07/16 1154 03/07/16 1329 03/07/16 1552  BP: (!) 85/46   (!) 81/54  Pulse:    (!) 134  Resp:    (!) 0  Temp:  (!)  102.8 F (39.3 C) (!) 100.4 F (38 C)   TempSrc:  Oral Oral   SpO2: 97%   100%  Weight:      Height:        Constitutional/General:  Lethargic, arousable, answers simple questions. In mild respiratory distress.  Chronically ill appearing.  Body mass index is 23.99 kg/m. Wt Readings from Last 3 Encounters:  03/06/16 139 lb 12.4 oz (63.4 kg)  01/17/16 131 lb 6.4 oz (59.6 kg)  11/29/15 130 lb 15.3 oz (59.4 kg)    HEENT: Pupils equal and reactive to light and accommodation. Anicteric sclerae. Normal nasal mucosa.   No oral  lesions,  mouth clear,  oropharynx clear, no postnasal drip. (-) Oral thrush. No dental caries.  Airway - Mallampati class III  Neck: No masses. Midline trachea. No JVD, (-) LAD. (-) bruits appreciated.  Respiratory/Chest: Grossly normal chest. (-) deformity. Occasional  Accessory muscle use.  Symmetric expansion. (-) Tenderness on palpation.  Resonant on percussion.  Diminished BS on both lower lung zones. (-) wheezing, rhonchi. Crackles at bases.  (-) egophony  Cardiovascular: Regular rate and  Rhythm, tachycardic.  heart sounds normal, no murmur or gallops, Gr 1 peripheral edema  Gastrointestinal:  Normal bowel sounds. Soft, non-tender. No hepatosplenomegaly.  (-) masses.   Musculoskeletal:  Normal muscle tone. Normal gait.   Extremities: Grossly normal. (-) clubbing, cyanosis.  Gr 1 edema. Has AVF in thigh, L  Skin: (-) rash,lesions seen. Warm extremities.   Neurological/Psychiatric : Drowsy, arousable. Complaining of chest pain.  CBC Recent Labs     03/05/16  0927  03/06/16  0248  03/07/16  0425  WBC  17.2*  15.2*  16.0*  HGB  9.8*  10.9*  8.2*  HCT  30.1*  32.3*  25.1*  PLT  158  240  157    Coag's Recent Labs     03/05/16  0927  03/07/16  0852  INR  1.81  1.39    BMET Recent Labs     03/05/16  0927  03/06/16  0248  03/07/16  0425  NA  134*  136  133*  K  5.2*  6.6*  4.5  CL  94*  95*  94*  CO2  25  26  27   BUN  43*   56*  28*  CREATININE  8.11*  9.34*  6.49*  GLUCOSE  167*  139*  131*    Electrolytes Recent Labs     03/05/16  0927  03/06/16  0248  03/07/16  0425  CALCIUM  8.3*  8.3*  8.4*    Sepsis Markers Recent Labs     03/07/16  0841  PROCALCITON  7.83    ABG Recent Labs     03/07/16  0755  PHART  7.457*  PCO2ART  39.4  PO2ART  73.5*    Liver Enzymes Recent Labs     03/07/16  0425  AST  33  ALT  16  ALKPHOS  314*  BILITOT  3.0*  ALBUMIN  1.9*    Cardiac Enzymes Recent Labs     03/05/16  1529  03/05/16  2122  03/06/16  0248  TROPONINI  1.38*  2.19*  3.48*    Glucose Recent Labs     03/06/16  1617  03/06/16  2130  03/07/16  0111  03/07/16  0722  03/07/16  1106  03/07/16  1608  GLUCAP  90  78  134*  106*  99  114*    Imaging Dg Chest Port 1 View  Result Date: 03/07/2016 CLINICAL DATA:  Fever, hypoxemia. EXAM: PORTABLE CHEST 1 VIEW COMPARISON:  Radiographs of March 05, 2016. FINDINGS: Stable cardiomediastinal silhouette. Status post aortic valve repair. Atherosclerosis of thoracic aorta is noted. No pneumothorax is noted. Interval development of diffuse interstitial densities throughout both lungs concerning for edema or possibly pneumonia. Mild left pleural effusion is noted. Bony thorax is unremarkable. IMPRESSION: Aortic atherosclerosis. Diffuse interstitial densities are noted throughout both lungs concerning for edema or possibly pneumonia. Mild left pleural effusion. Electronically Signed   By: Marijo Conception, M.D.   On: 03/07/2016 09:29   Dg Foot 2 Views Left  Result Date: 03/07/2016 CLINICAL DATA:  Left foot tenderness. EXAM: LEFT FOOT - 2 VIEW COMPARISON:  None. FINDINGS: Extensive vascular calcifications throughout the foot. Plantar and posterior calcaneal spurs. No radiographic changes of osteomyelitis. No fracture, subluxation or dislocation. IMPRESSION: No acute bony abnormality.  Extensive vascular calcifications. Electronically Signed   By:  Rolm Baptise M.D.   On: 03/07/2016 09:27   Assessment/Plan :  Hypotension in a setting of elevated WBC, febrile. CXR with B infiltrates, new compared to 2 days prior. Likely hypotension 2/2 sepsis. Likely HCAP vs bacteremia.  There might be a component of dehydration as well but the x-ray finding is more consistent with fluid overload in the setting of demand ischemia, troponin which is still elevating. > Continue with gentle hydration.  I think MAP > 60 mm Hg should be adequate. Pt is anuric and HD is on hold 2/2 low BP.  WOF congestion > Continue broad-spectrum antibiotics.(Vanc and cefepime) > deescalate accordingly.  > trend lactic acid, check PCT > Panculture. > I extensively discussed with patient's daughter the overall poor prognosis. Patient is a DO NOT RESUSCITATE, DO NOT INTUBATE. She does not want BiPAP. She however is okay with pressors and central line insertion. If you look at the big picture, I do not think pressors  will make a whole lot of difference with patient's overall prognosis. I discussed this with the daughter and she agrees. We can try Neo-Synephrine peripherally and see how she does.   Acute respiratory failure 2/2 demand ischemia (on going cp) and pulm edema and possible HCAP > DNR/DNI. No BiPaP > Cont o2 to keep sats > 88%.    ESRD on HD > HD on hold 2/2 hypotension. I do not think CRRT will clinically change overall prognosis of the patient. We'll defer to nephrology.    Demand ischemia > cardiology following.    I extensively discussed the case with the patient's daughter. She makes decision for her. I recommended, if patient clinically deteriorates the next 12-24 hours, she should be made comfort care. She seemed to understand pt's condition.    I spent  30  minutes of Critical Care time with this patient today. This is my time spent independent of the APP or resident.     Monica Becton, MD 03/07/2016,  4:45 PM [facility] Pager [phone] After 3 pm or if no answer, call [phone]

## 2016-03-08 ENCOUNTER — Inpatient Hospital Stay (HOSPITAL_COMMUNITY): Payer: Medicare Other

## 2016-03-08 ENCOUNTER — Other Ambulatory Visit (HOSPITAL_COMMUNITY): Payer: Medicare Other

## 2016-03-08 DIAGNOSIS — Z01810 Encounter for preprocedural cardiovascular examination: Secondary | ICD-10-CM

## 2016-03-08 DIAGNOSIS — R6521 Severe sepsis with septic shock: Secondary | ICD-10-CM

## 2016-03-08 DIAGNOSIS — A419 Sepsis, unspecified organism: Principal | ICD-10-CM

## 2016-03-08 LAB — BLOOD CULTURE ID PANEL (REFLEXED)
Acinetobacter baumannii: NOT DETECTED
CANDIDA GLABRATA: NOT DETECTED
CANDIDA KRUSEI: NOT DETECTED
Candida albicans: NOT DETECTED
Candida parapsilosis: NOT DETECTED
Candida tropicalis: NOT DETECTED
ENTEROBACTER CLOACAE COMPLEX: NOT DETECTED
ENTEROCOCCUS SPECIES: NOT DETECTED
ESCHERICHIA COLI: NOT DETECTED
Enterobacteriaceae species: NOT DETECTED
Haemophilus influenzae: NOT DETECTED
KLEBSIELLA OXYTOCA: NOT DETECTED
Klebsiella pneumoniae: NOT DETECTED
LISTERIA MONOCYTOGENES: NOT DETECTED
Methicillin resistance: NOT DETECTED
Neisseria meningitidis: NOT DETECTED
PROTEUS SPECIES: NOT DETECTED
Pseudomonas aeruginosa: NOT DETECTED
STAPHYLOCOCCUS SPECIES: DETECTED — AB
STREPTOCOCCUS AGALACTIAE: NOT DETECTED
STREPTOCOCCUS PNEUMONIAE: NOT DETECTED
Serratia marcescens: NOT DETECTED
Staphylococcus aureus (BCID): NOT DETECTED
Streptococcus pyogenes: NOT DETECTED
Streptococcus species: NOT DETECTED

## 2016-03-08 LAB — GLUCOSE, CAPILLARY
GLUCOSE-CAPILLARY: 67 mg/dL (ref 65–99)
GLUCOSE-CAPILLARY: 65 mg/dL (ref 65–99)
GLUCOSE-CAPILLARY: 129 mg/dL — AB (ref 65–99)
Glucose-Capillary: 61 mg/dL — ABNORMAL LOW (ref 65–99)
Glucose-Capillary: 63 mg/dL — ABNORMAL LOW (ref 65–99)
Glucose-Capillary: 47 mg/dL — ABNORMAL LOW (ref 65–99)

## 2016-03-08 LAB — CBC
HEMATOCRIT: 23.1 % — AB (ref 36.0–46.0)
HEMOGLOBIN: 7.6 g/dL — AB (ref 12.0–15.0)
MCH: 31.7 pg (ref 26.0–34.0)
MCHC: 32.9 g/dL (ref 30.0–36.0)
MCV: 96.3 fL (ref 78.0–100.0)
Platelets: 150 10*3/uL (ref 150–400)
RBC: 2.4 MIL/uL — AB (ref 3.87–5.11)
RDW: 15.9 % — ABNORMAL HIGH (ref 11.5–15.5)
WBC: 25.3 10*3/uL — ABNORMAL HIGH (ref 4.0–10.5)

## 2016-03-08 LAB — RESPIRATORY PANEL BY PCR
Adenovirus: NOT DETECTED
BORDETELLA PERTUSSIS-RVPCR: NOT DETECTED
CORONAVIRUS HKU1-RVPPCR: NOT DETECTED
Chlamydophila pneumoniae: NOT DETECTED
Coronavirus 229E: NOT DETECTED
Coronavirus NL63: NOT DETECTED
Coronavirus OC43: NOT DETECTED
INFLUENZA A-RVPPCR: NOT DETECTED
INFLUENZA B-RVPPCR: NOT DETECTED
METAPNEUMOVIRUS-RVPPCR: NOT DETECTED
Mycoplasma pneumoniae: NOT DETECTED
PARAINFLUENZA VIRUS 2-RVPPCR: NOT DETECTED
PARAINFLUENZA VIRUS 3-RVPPCR: NOT DETECTED
PARAINFLUENZA VIRUS 4-RVPPCR: NOT DETECTED
Parainfluenza Virus 1: NOT DETECTED
RESPIRATORY SYNCYTIAL VIRUS-RVPPCR: NOT DETECTED
RHINOVIRUS / ENTEROVIRUS - RVPPCR: NOT DETECTED

## 2016-03-08 LAB — BASIC METABOLIC PANEL
ANION GAP: 11 (ref 5–15)
BUN: 40 mg/dL — ABNORMAL HIGH (ref 6–20)
CO2: 26 mmol/L (ref 22–32)
Calcium: 8 mg/dL — ABNORMAL LOW (ref 8.9–10.3)
Chloride: 97 mmol/L — ABNORMAL LOW (ref 101–111)
Creatinine, Ser: 7.94 mg/dL — ABNORMAL HIGH (ref 0.44–1.00)
GFR calc Af Amer: 6 mL/min — ABNORMAL LOW (ref >60)
GFR calc non Af Amer: 5 mL/min — ABNORMAL LOW (ref >60)
GLUCOSE: 107 mg/dL — AB (ref 65–99)
Potassium: 4.2 mmol/L (ref 3.5–5.1)
Sodium: 134 mmol/L — ABNORMAL LOW (ref 135–145)

## 2016-03-08 LAB — ECHOCARDIOGRAM COMPLETE
HEIGHTINCHES: 64 in
Weight: 2239.87 oz

## 2016-03-08 LAB — HEPARIN LEVEL (UNFRACTIONATED): Heparin Unfractionated: 0.34 IU/mL (ref 0.30–0.70)

## 2016-03-08 LAB — PROCALCITONIN: PROCALCITONIN: 37.57 ng/mL

## 2016-03-08 MED ORDER — REGADENOSON 0.4 MG/5ML IV SOLN
INTRAVENOUS | Status: AC
Start: 2016-03-08 — End: 2016-03-08
  Filled 2016-03-08: qty 5

## 2016-03-08 MED ORDER — DEXTROSE 50 % IV SOLN
50.0000 mL | Freq: Once | INTRAVENOUS | Status: AC
  Administered 2016-03-08: 50 mL via INTRAVENOUS
  Filled 2016-03-08: qty 50

## 2016-03-08 MED ORDER — DEXTROSE 5 % IV SOLN
INTRAVENOUS | Status: DC
  Administered 2016-03-08: 17:00:00 via INTRAVENOUS

## 2016-03-08 MED ORDER — AMINOPHYLLINE 25 MG/ML IV SOLN
INTRAVENOUS | Status: AC
  Filled 2016-03-08: qty 10

## 2016-03-08 MED ORDER — TECHNETIUM TC 99M TETROFOSMIN IV KIT
10.0000 | PACK | Freq: Once | INTRAVENOUS | Status: AC | PRN
  Administered 2016-03-08: 10 via INTRAVENOUS

## 2016-03-08 MED ORDER — DEXTROSE 50 % IV SOLN
INTRAVENOUS | Status: AC
  Filled 2016-03-08: qty 50

## 2016-03-08 MED ORDER — VANCOMYCIN HCL IN DEXTROSE 750-5 MG/150ML-% IV SOLN
750.0000 mg | INTRAVENOUS | Status: DC | PRN
  Administered 2016-03-09: 750 mg via INTRAVENOUS
  Filled 2016-03-08 (×2): qty 150

## 2016-03-08 MED ORDER — VANCOMYCIN HCL IN DEXTROSE 750-5 MG/150ML-% IV SOLN: 750.0000 mg | Freq: Once | INTRAVENOUS | Status: DC

## 2016-03-08 NOTE — Progress Notes (Addendum)
Subjective:  Currently in bed having echocardiogram, no complaints of chest pain or shortness of breath, remains lethargic. Unable to do perfusion scan b/o vomiting.   Objective:  Vital Signs in the last 24 hours: BP (!) 110/52   Pulse 72   Temp 98.1 F (36.7 C) (Oral)   Resp 11   Ht 5\' 4"  (1.626 m)   Wt 63.5 kg (139 lb 15.9 oz)   SpO2 94%   BMI 24.03 kg/m   Physical Exam: Lethargic appearing black female currently in no acute distress Lungs:  Clear Cardiac:  Regular rhythm, normal S1 and S2, no S3, valve sounds heard, 1/6 murmur of aortic regurgitation  Extremities: Prior failed dialysis grafts with poor peripheral pulses, no edema, ulcer on the left heel, no weakness  Intake/Output from previous day: 01/26 0701 - 01/27 0700 In: 1053.8 [I.V.:1053.8] Out: 0   Weight Filed Weights   03/06/16 1345 03/07/16 1810 03/08/16 0429  Weight: 63.4 kg (139 lb 12.4 oz) 63.5 kg (139 lb 15.9 oz) 63.5 kg (139 lb 15.9 oz)    Lab Results: Basic Metabolic Panel:  Recent Labs  03/07/16 0425 03/08/16 0230  NA 133* 134*  K 4.5 4.2  CL 94* 97*  CO2 27 26  GLUCOSE 131* 107*  BUN 28* 40*  CREATININE 6.49* 7.94*   CBC:  Recent Labs  03/07/16 0425 03/08/16 0230  WBC 16.0* 25.3*  HGB 8.2* 7.6*  HCT 25.1* 23.1*  MCV 96.5 96.3  PLT 157 150   Cardiac Enzymes: Cardiac Panel (last 3 results)  Recent Labs  03/05/16 1529 03/05/16 2122 03/06/16 0248  TROPONINI 1.38* 2.19* 3.48*    Telemetry: Currently sinus rhythm  Assessment/Plan:  1.  Elevated troponin likely demand ischemia in the setting of sepsis and renal failure 2. End Stage renal disease on hemodialysis 3.  Previous aortic valve replacement with LIMA to the LAD previously patent 4.  Atrial arrhythmia has converted to sinus rhythm  5.  Severe anemia  Recommendations:  I will closely review echo when completed.  She has significant leukocytosis and is anemic again today.    She is to have dialysis tomorrow. No  need to do myocardial perfusion scan unless she is in shape to act on any abnormal results.       Kerry Hough  MD [facility]  03/08/2016, 2:03 PM

## 2016-03-08 NOTE — Progress Notes (Signed)
Transported to Nuclear [facility].

## 2016-03-08 NOTE — Progress Notes (Signed)
dcd droplet precaution- resp virus panel negative.

## 2016-03-08 NOTE — Progress Notes (Signed)
  Echocardiogram 2D Echocardiogram has been performed.  [name] 03/08/2016, 2:22 PM

## 2016-03-08 NOTE — Progress Notes (Signed)
Placed on droplet precaution per Dr Vaughan Browner order. Respiratory virus sample sent to lab.

## 2016-03-08 NOTE — Progress Notes (Signed)
PHARMACY - PHYSICIAN COMMUNICATION CRITICAL VALUE ALERT - BLOOD CULTURE IDENTIFICATION (BCID)  Results for orders placed or performed during the hospital encounter of 03/05/16  Blood Culture ID Panel (Reflexed) (Collected: 03/07/2016  8:52 AM)  Result Value Ref Range   Enterococcus species NOT DETECTED NOT DETECTED   Listeria monocytogenes NOT DETECTED NOT DETECTED   Staphylococcus species DETECTED (A) NOT DETECTED   Staphylococcus aureus NOT DETECTED NOT DETECTED   Methicillin resistance NOT DETECTED NOT DETECTED   Streptococcus species NOT DETECTED NOT DETECTED   Streptococcus agalactiae NOT DETECTED NOT DETECTED   Streptococcus pneumoniae NOT DETECTED NOT DETECTED   Streptococcus pyogenes NOT DETECTED NOT DETECTED   Acinetobacter baumannii NOT DETECTED NOT DETECTED   Enterobacteriaceae species NOT DETECTED NOT DETECTED   Enterobacter cloacae complex NOT DETECTED NOT DETECTED   Escherichia coli NOT DETECTED NOT DETECTED   Klebsiella oxytoca NOT DETECTED NOT DETECTED   Klebsiella pneumoniae NOT DETECTED NOT DETECTED   Proteus species NOT DETECTED NOT DETECTED   Serratia marcescens NOT DETECTED NOT DETECTED   Haemophilus influenzae NOT DETECTED NOT DETECTED   Neisseria meningitidis NOT DETECTED NOT DETECTED   Pseudomonas aeruginosa NOT DETECTED NOT DETECTED   Candida albicans NOT DETECTED NOT DETECTED   Candida glabrata NOT DETECTED NOT DETECTED   Candida krusei NOT DETECTED NOT DETECTED   Candida parapsilosis NOT DETECTED NOT DETECTED   Candida tropicalis NOT DETECTED NOT DETECTED    Name of physician (or Provider) Contacted: Dr Vaughan Browner   Changes to prescribed antibiotics required: Already on vanc but likely contaminant   Levester Fresh, PharmD, BCPS, BCCCP Clinical Pharmacist Clinical phone for 03/08/2016 from 7a-3:30p: [phone] If after 3:30p, please call main pharmacy at: [phone] 03/08/2016 9:55 AM

## 2016-03-08 NOTE — Progress Notes (Signed)
*  PRELIMINARY RESULTS* Vascular Ultrasound Right lower extremity arterial duplex has been completed.  Preliminary findings: >50% distal right femoral artery stenosis.  Biphasic flow in the right distal EIA, but monophasic flow in the right CFA and distally. Mild heterogenous plaque noted throughout right lower extremity.    Landry Mellow, RDMS, RVT  03/08/2016, 8:30 AM

## 2016-03-08 NOTE — Consult Note (Signed)
Name: [name] MRN: [id number] DOB: 12/25/1955    ADMISSION DATE:  03/05/2016 CONSULTATION DATE:  1/26  REFERRING MD : Dr. 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 Presents to ED on 1/24 from HD with A.Flutter HR of 180. Patient reported left foot pain in which she is being followed by vascular. Upon arrival to ED patient reports chest pain and had pos troponins as well as a small left pleural effusion on chest xray. Cardiology was consulted and believed that elevated trops were due to ischemic demand in setting of a possible infection. During stay patient has been febrile, lactic acid 1.5, and pending blood cultures. Patient was admitted to step-down under triad. On 1/26 patient became increasingly lethargic and hypotensive. PCCM was asked to consult.   On arrival to unit daughter was at bedside and verified with patient that she does not wish to be intubated, wear BIPAP, or have CPR. However, is okay with central line placement and vasoactive medications if needed.   SIGNIFICANT EVENTS  1/24 > Presented to ED with chest pain and aflutter  1/26 > PCCM consulted for hypotension, transferred to ICU  STUDIES:  1/24 > Left Lower Ext Duplex > Neg acute  1/26 > CXR > diffuse interstitial densities, edema vs pna  1/27 > CXR > Diffuse B/L infiltrates. Images reviewed  Microbiology:  Blood 1/25 > No growth Blood 1/26 >  Staph aureus Flu A pcr 1/25 > Negative  Antibiotics:  Cefepime 1/26 > Vancomycin 1/26 >   PAST MEDICAL HISTORY :   has a past medical history of Anemia; Anxiety; Aortic stenosis; Arthritis; Asthma; CAD (coronary artery disease); CAD (coronary artery disease) (Jan. 2015); Chest pain (03/06/2015); Chronic back pain; Chronic bronchitis ([place]); Chronic constipation; COPD (chronic obstructive pulmonary disease) ([place]); Depression; End  stage renal disease on dialysis [facility]) (02/27/2011); GERD (gastroesophageal reflux disease); Headache; Hepatitis C; History of blood transfusion; Hypertension; Hypothyroidism; Migraine; Neuromuscular disorder ([place]); Obesity; Peripheral vascular disease (HCC) (hands and legs); Pneumonia; S/P aortic valve replacement (03/15/13); S/P liver transplant ([facility]); SVT (supraventricular tachycardia) ([place]) (06/09/14); Tobacco abuse; and Type II diabetes mellitus ([place]).  has a past surgical history that includes Liver transplant (10/25/2009); Small intestine surgery (90's); Thrombectomy; Arteriovenous graft placement (Left, 10/03/10); Arteriovenous goretex graft removal (12/23/2010); Insertion of dialysis catheter (12/23/2010); Cholecystectomy (1993); Cystoscopy (1990's); Spinal growth rods (2010); AV fistula placement (01/29/2011); AV fistula placement (03/10/2011); Tubal ligation (1990's); Aortic valve replacement (N/A, 03/15/2013); Coronary artery bypass graft (N/A, 03/15/2013); Intraoprative transesophageal echocardiogram (N/A, 03/15/2013); left heart catheterization with coronary/graft angiogram (N/A, 12/24/2011); left heart catheterization with coronary angiogram (N/A, 07/29/2012); left heart catheterization with coronary angiogram (N/A, 03/10/2013); left heart catheterization with coronary/graft angiogram (N/A, 12/16/2013); Thrombectomy and revision of arterioventous (av) goretex  graft (Left, 03/30/2014); Thrombectomy and revision of arterioventous (av) goretex  graft (Left, 03/30/2014); shuntogram (Left, 05/15/2014); Cardiac catheterization (N/A, 11/06/2015); Cardiac catheterization (N/A, 11/06/2015); and Cardiac catheterization (11/06/2015). Prior to Admission medications   Medication Sig Start Date End Date Taking? Authorizing Provider  aspirin EC 81 MG tablet Take 81 mg by mouth daily.   Yes Historical Provider, MD  calcium acetate (PHOSLO) 667 MG capsule Take 1 capsule (667 mg total) by mouth 3 (three) times daily with meals. 10/16/15   Yes Orson Eva, MD  gabapentin (NEURONTIN) 300 MG capsule Take 300 mg by mouth  every evening.   Yes Historical Provider, MD  Insulin Detemir (LEVEMIR FLEXPEN) 100 UNIT/ML Pen Inject 2-6 Units into the skin See admin instructions. Only if cbg is over 200 12/27/15  Yes Historical Provider, MD  levothyroxine (SYNTHROID, LEVOTHROID) 175 MCG tablet Take 1 tablet (175 mcg total) by mouth daily before breakfast. 02/15/16  Yes Troy Sine, MD  metoprolol tartrate (LOPRESSOR) 25 MG tablet Take 25 mg by mouth 2 (two) times daily. 03/16/14  Yes Historical Provider, MD  ondansetron (ZOFRAN ODT) 4 MG disintegrating tablet Take 1 tablet (4 mg total) by mouth every 8 (eight) hours as needed for nausea. 08/13/15  Yes Tanna Furry, MD  oxyCODONE (OXY IR/ROXICODONE) 5 MG immediate release tablet Take 5 mg by mouth every 4 (four) hours as needed. 03/03/16  Yes Historical Provider, MD  senna-docusate (SENOKOT-S) 8.6-50 MG tablet Take 1 tablet by mouth daily.   Yes Historical Provider, MD  tacrolimus (PROGRAF) 1 MG capsule Take 2 capsules (2 mg total) by mouth 2 (two) times daily. Take 2 capsules twice daily. 11/29/15  Yes Maryann Mikhail, DO  warfarin (COUMADIN) 2 MG tablet Take 1- 2 tablets by mouth daily or as directed by coumadin clinic Patient taking differently: Take 2 mg by mouth daily. Take as directed by coumadin clinic 11/01/15  Yes Troy Sine, MD  nitroGLYCERIN (NITROSTAT) 0.4 MG SL tablet Place 1 tablet (0.4 mg total) under the tongue every 5 (five) minutes as needed for chest pain. 03/20/14   Albertine Patricia, MD   Allergies  Allergen Reactions  . Acetaminophen Other (See Comments)    Liver transplant recipient   . Codeine Itching  . Mirtazapine Other (See Comments)    hallucination  . Morphine Itching and Other (See Comments)    hallucinate    FAMILY HISTORY:  family history includes Cancer in her father and mother; Diabetes in her mother; Hypertension in her mother; Stroke in her mother. SOCIAL  HISTORY:  reports that she has been smoking Cigarettes.  She has a 20.00 pack-year smoking history. She has never used smokeless tobacco. She reports that she does not drink alcohol or use drugs.  REVIEW OF SYSTEMS:   All negative; except for those that are bolded, which indicate positives.  Constitutional: weight loss, weight gain, night sweats, fevers, chills, fatigue, weakness.  HEENT: headaches, sore throat, sneezing, nasal congestion, post nasal drip, difficulty swallowing, tooth/dental problems, visual complaints, visual changes, ear aches. Neuro: difficulty with speech, weakness, numbness, ataxia. CV:  chest pain, orthopnea, PND, swelling in lower extremities, dizziness, palpitations, syncope.  Resp: cough, hemoptysis, dyspnea, wheezing. GI: heartburn, indigestion, abdominal pain, nausea, vomiting, diarrhea, constipation, change in bowel habits, loss of appetite, hematemesis, melena, hematochezia.  GU: dysuria, change in color of urine, urgency or frequency, flank pain, hematuria. MSK: joint pain or swelling, decreased range of motion. Psych: change in mood or affect, depression, anxiety, suicidal ideations, homicidal ideations. Skin: rash, itching, bruising.  SUBJECTIVE:  Transferred to the ICU last night for increasing hypotension, tachycardia Started on peripheral Neo-Synephrine. Currently stable and pressors are weaning down  PHYSICAL EXAMINATION: Blood pressure (!) 116/54, pulse 67, temperature 98.1 F (36.7 C), temperature source Oral, resp. rate 14, height 5\' 4"  (1.626 m), weight 139 lb 15.9 oz (63.5 kg), SpO2 100 %. Gen:      No acute distress HEENT:  EOMI, sclera anicteric Neck:     No masses; no thyromegaly Lungs:    Clear to auscultation bilaterally; normal respiratory effort CV:  Regular rate and rhythm; no murmurs Abd:      + bowel sounds; soft, non-tender; no palpable masses, no distension Ext:    No edema; adequate peripheral perfusion Skin:      Warm and  dry; no rash Neuro: alert and oriented x 3 Psych: normal mood and affect   Recent Labs Lab 03/06/16 0248 03/07/16 0425 03/08/16 0230  NA 136 133* 134*  K 6.6* 4.5 4.2  CL 95* 94* 97*  CO2 26 27 26   BUN 56* 28* 40*  CREATININE 9.34* 6.49* 7.94*  GLUCOSE 139* 131* 107*    Recent Labs Lab 03/06/16 0248 03/07/16 0425 03/08/16 0230  HGB 10.9* 8.2* 7.6*  HCT 32.3* 25.1* 23.1*  WBC 15.2* 16.0* 25.3*  PLT 240 157 150   Dg Chest Port 1 View  Result Date: 03/07/2016 CLINICAL DATA:  Fever, hypoxemia. EXAM: PORTABLE CHEST 1 VIEW COMPARISON:  Radiographs of March 05, 2016. FINDINGS: Stable cardiomediastinal silhouette. Status post aortic valve repair. Atherosclerosis of thoracic aorta is noted. No pneumothorax is noted. Interval development of diffuse interstitial densities throughout both lungs concerning for edema or possibly pneumonia. Mild left pleural effusion is noted. Bony thorax is unremarkable. IMPRESSION: Aortic atherosclerosis. Diffuse interstitial densities are noted throughout both lungs concerning for edema or possibly pneumonia. Mild left pleural effusion. Electronically Signed   By: Marijo Conception, M.D.   On: 03/07/2016 09:29   Dg Foot 2 Views Left  Result Date: 03/07/2016 CLINICAL DATA:  Left foot tenderness. EXAM: LEFT FOOT - 2 VIEW COMPARISON:  None. FINDINGS: Extensive vascular calcifications throughout the foot. Plantar and posterior calcaneal spurs. No radiographic changes of osteomyelitis. No fracture, subluxation or dislocation. IMPRESSION: No acute bony abnormality.  Extensive vascular calcifications. Electronically Signed   By: Rolm Baptise M.D.   On: 03/07/2016 09:27    DISCUSSION: Admitted with Sepsis, staph aureus bacteremia.  Now in ICU for hypotension requiring pressors. 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  ASSESSMENT / PLAN:  PULMONARY A: Stable  CXR with B/L infiltrates concerning for HCAP, viral vs fluid overload P:   Wean down O2 as tolerated. Continue abx Check RVP  CARDIOVASCULAR A:  Elevated TnI Shock H/O CAD P:  Continue heparin drip. Lopressor  Follow TnI, Cardiology on board Follow echo  RENAL A:   ESRD on HD P:   Plan for repeat HD today Discussed with nephrology service  GASTROINTESTINAL A:   Stable P:   PO diet  HEMATOLOGIC A:   Leukocytosis secondary to sepis P:  Monitor CBC  INFECTIOUS A:   S aureus bactermia P:   Continue vanco, cefepime Follow Pct, final cultures  ENDOCRINE A:   DM  P:   SSI, lantus  NEUROLOGIC A:   Stable P:    FAMILY  - Updates: As per family discussiontoday she is a full DNR, no BiPAP but okay for pressors. No family at bedside today - Inter-disciplinary family meet or Palliative Care meeting due by:  Day 7  Critical care time- 38 mins.  Marshell Garfinkel MD [facility] Pager [phone] If no answer or after 3pm call: [phone] 03/08/2016, 9:29 AM

## 2016-03-08 NOTE — Progress Notes (Signed)
[facility] Progress Note   Subjective:  Moved to ICU, on neo now. More alert  Vitals:   03/08/16 0600 03/08/16 0700 03/08/16 0800 03/08/16 0810  BP: (!) 82/35 (!) 106/51 (!) 91/45   Pulse: 72  72   Resp: 16 14 17    Temp:    98.1 F (36.7 C)  TempSrc:    Oral  SpO2: 95%  100%   Weight:      Height:        Inpatient medications: . calcitRIOL  0.25 mcg Oral Q T,Th,Sa-HD  . calcium acetate  667 mg Oral TID WC  . ceFEPime (MAXIPIME) IV  1 g Intravenous Q24H  . darbepoetin (ARANESP) injection - DIALYSIS  60 mcg Intravenous Q Thu-HD  . gabapentin  300 mg Oral QPM  . insulin aspart  0-5 Units Subcutaneous QHS  . insulin aspart  0-9 Units Subcutaneous TID WC  . insulin detemir  5 Units Subcutaneous QHS  . levothyroxine  175 mcg Oral QAC breakfast  . multivitamin  1 tablet Oral QHS  . regadenoson  0.4 mg Intravenous Once  . senna-docusate  1 tablet Oral Daily  . sodium chloride flush  3 mL Intravenous Q12H  . tacrolimus  2 mg Oral BID  . vancomycin  750 mg Intravenous Q T,Th,Sa-HD   . heparin 1,400 Units/hr (03/08/16 0600)  . phenylephrine (NEO-SYNEPHRINE) Adult infusion 17 mcg/min (03/08/16 0600)   acetaminophen, acetaminophen, metoprolol, morphine injection, ondansetron (ZOFRAN) IV, ondansetron, oxyCODONE  Exam: General: alert, responsive, calm No jvd Heart: RRR on tel. mech clinking / no rub or gal.  Lungs: cta  Abdomen: bs pos soft nt ,nd Extremities: L trace pedal edema Skin: bilat skin darkening of L>R foot / L heal dried ulcer Neuro: alert / moves all extrem . No overt focal deficits  Dialysis Access: Pos bruit L FEM AVGG  Dialysis: [name] MWF 3h 71min  59.5kg   Hep none   L fem AVG Po calcitriol 0.39mcg /HD  MIrcera 60 mcg  q 2wks   *was to start 03/05/16" Other op labs hgb 9.8  Ca 9.3 phos 8.7  PTH 1294  Assessment: 1. PAD/ L lower extrem pain= per primary, presumed ischemia to LE's 2. Fever / hypotension - on pressors. DNR/DNI; one blood cx  is + 3. ESRD - plan is for intermittent HD if BP will tolerate. If not will need palliative care/ hospice 4. Aflutter/ RVR - back in NSR 5. Chest Pain / CAD ho / AOR - card consulted "demand ischemia " 6. Volume - up 3 kg by wts, no gross excess on exam 7. Anemia  - hgb 9.8  Start ESA with HD  8. 2HPTH - cont vit D/ phoslo 9. HO Liver transplant sec Hep c- on meds 10. DM  Type 2 - per admit  11. COPD  Plan - as above, HD today in ICU, have d/w primary MD   [name] MD [place] pager [phone]   03/08/2016, 8:26 AM    Recent Labs Lab 03/06/16 0248 03/07/16 0425 03/08/16 0230  NA 136 133* 134*  K 6.6* 4.5 4.2  CL 95* 94* 97*  CO2 26 27 26   GLUCOSE 139* 131* 107*  BUN 56* 28* 40*  CREATININE 9.34* 6.49* 7.94*  CALCIUM 8.3* 8.4* 8.0*    Recent Labs Lab 03/07/16 0425  AST 33  ALT 16  ALKPHOS 314*  BILITOT 3.0*  PROT 5.8*  ALBUMIN 1.9*    Recent Labs Lab 03/06/16  [id number] 03/07/16 0425 03/08/16 0230  WBC 15.2* 16.0* 25.3*  HGB 10.9* 8.2* 7.6*  HCT 32.3* 25.1* 23.1*  MCV 96.4 96.5 96.3  PLT 240 157 150   Iron/TIBC/Ferritin/ %Sat    Component Value Date/Time   FERRITIN 1,095 (H) 08/30/2012 1449

## 2016-03-08 NOTE — Progress Notes (Signed)
ANTICOAGULATION CONSULT NOTE - Follow-up Consult  Pharmacy Consult for heparin Indication: atrial flutter, AVR   Allergies  Allergen Reactions  . Acetaminophen Other (See Comments)    Liver transplant recipient   . Codeine Itching  . Mirtazapine Other (See Comments)    hallucination  . Morphine Itching and Other (See Comments)    hallucinate    Patient Measurements: Height: 5\' 4"  (162.6 cm) Weight: 139 lb 15.9 oz (63.5 kg) IBW/kg (Calculated) : 54.7 Heparin Dosing Weight: 59 kg  Vital Signs: Temp: 98.2 F (36.8 C) (01/27 0409) Temp Source: Oral (01/27 0409) BP: 92/46 (01/27 0400) Pulse Rate: 72 (01/27 0400)  Labs:  Recent Labs  03/05/16 0927 03/05/16 1529 03/05/16 2122  03/06/16 0248  03/07/16 0425 03/07/16 0852 03/07/16 1905 03/08/16 0230  HGB 9.8*  --   --   --  10.9*  --  8.2*  --   --  7.6*  HCT 30.1*  --   --   --  32.3*  --  25.1*  --   --  23.1*  PLT 158  --   --   --  240  --  157  --   --  150  LABPROT 21.2*  --   --   --   --   --   --  17.1*  --   --   INR 1.81  --   --   --   --   --   --  1.39  --   --   HEPARINUNFRC  --   --   --   < >  --   < >  --  0.20* 0.33 0.34  CREATININE 8.11*  --   --   --  9.34*  --  6.49*  --   --  7.94*  TROPONINI  --  1.38* 2.19*  --  3.48*  --   --   --   --   --   < > = values in this interval not displayed.  Estimated Creatinine Clearance: 6.4 mL/min (by C-G formula based on SCr of 7.94 mg/dL (H)).  Assessment: Pharmacy consulted to dose heparin bridge for atrial flutter and AVR. Pt on coumadin PTA per coumadin clinic. Home dose is 2 mg daily, last dose 1/23. Last coumadin clinic visit 02/29/16 with INR 2.7, INR goal 2.5 - 3 per clinic notes. Gets INRs drawn at HD center. INR subtherapeutic (1.81) on admission, 1.39 yesterday. Coumadin is on hold.  Heparin level remains therapeutic at 0.34  on heparin at 1400 units/h. Hg down 7.6, plt wnl but down to 150 . No bleed documented.  Goal of Therapy:  Heparin level  0.3-0.7 units/ml; INR 2.5-3 Monitor platelets by anticoagulation protocol: Yes   Plan: Continue Heparin infusion at current rate, 1400 units / hr Daily heparin level and CBC Monitor for s/sx bleeding Warfarin on hold   Nicole Cella, RPh Clinical Pharmacist Pager: [phone] 03/08/2016 5:01 AM

## 2016-03-09 ENCOUNTER — Inpatient Hospital Stay (HOSPITAL_COMMUNITY): Payer: Medicare Other

## 2016-03-09 DIAGNOSIS — I70244 Atherosclerosis of native arteries of left leg with ulceration of heel and midfoot: Secondary | ICD-10-CM

## 2016-03-09 DIAGNOSIS — J9600 Acute respiratory failure, unspecified whether with hypoxia or hypercapnia: Secondary | ICD-10-CM

## 2016-03-09 DIAGNOSIS — N186 End stage renal disease: Secondary | ICD-10-CM

## 2016-03-09 LAB — RENAL FUNCTION PANEL
Albumin: 1.6 g/dL — ABNORMAL LOW (ref 3.5–5.0)
Anion gap: 9 (ref 5–15)
BUN: 15 mg/dL (ref 6–20)
CO2: 28 mmol/L (ref 22–32)
Calcium: 8 mg/dL — ABNORMAL LOW (ref 8.9–10.3)
Chloride: 97 mmol/L — ABNORMAL LOW (ref 101–111)
Creatinine, Ser: 4.49 mg/dL — ABNORMAL HIGH (ref 0.44–1.00)
GFR calc Af Amer: 11 mL/min — ABNORMAL LOW (ref >60)
GFR calc non Af Amer: 10 mL/min — ABNORMAL LOW (ref >60)
Glucose, Bld: 106 mg/dL — ABNORMAL HIGH (ref 65–99)
Phosphorus: 4.5 mg/dL (ref 2.5–4.6)
Potassium: 3.4 mmol/L — ABNORMAL LOW (ref 3.5–5.1)
Sodium: 134 mmol/L — ABNORMAL LOW (ref 135–145)

## 2016-03-09 LAB — BASIC METABOLIC PANEL
ANION GAP: 17 — AB (ref 5–15)
BUN: 47 mg/dL — AB (ref 6–20)
CALCIUM: 7.7 mg/dL — AB (ref 8.9–10.3)
CO2: 23 mmol/L (ref 22–32)
CREATININE: 9.12 mg/dL — AB (ref 0.44–1.00)
Chloride: 93 mmol/L — ABNORMAL LOW (ref 101–111)
GFR calc Af Amer: 5 mL/min — ABNORMAL LOW (ref >60)
GFR, EST NON AFRICAN AMERICAN: 4 mL/min — AB (ref >60)
GLUCOSE: 115 mg/dL — AB (ref 65–99)
Potassium: 4.3 mmol/L (ref 3.5–5.1)
Sodium: 133 mmol/L — ABNORMAL LOW (ref 135–145)

## 2016-03-09 LAB — CBC
HEMATOCRIT: 22.2 % — AB (ref 36.0–46.0)
HEMATOCRIT: 22.6 % — AB (ref 36.0–46.0)
Hemoglobin: 7.5 g/dL — ABNORMAL LOW (ref 12.0–15.0)
Hemoglobin: 7.5 g/dL — ABNORMAL LOW (ref 12.0–15.0)
MCH: 32.3 pg (ref 26.0–34.0)
MCH: 31.9 pg (ref 26.0–34.0)
MCHC: 33.8 g/dL (ref 30.0–36.0)
MCHC: 33.2 g/dL (ref 30.0–36.0)
MCV: 95.7 fL (ref 78.0–100.0)
MCV: 96.2 fL (ref 78.0–100.0)
PLATELETS: 170 10*3/uL (ref 150–400)
PLATELETS: 144 10*3/uL — AB (ref 150–400)
RBC: 2.32 MIL/uL — ABNORMAL LOW (ref 3.87–5.11)
RBC: 2.35 MIL/uL — ABNORMAL LOW (ref 3.87–5.11)
RDW: 16 % — AB (ref 11.5–15.5)
RDW: 15.8 % — AB (ref 11.5–15.5)
WBC: 16.7 10*3/uL — ABNORMAL HIGH (ref 4.0–10.5)
WBC: 18.3 10*3/uL — ABNORMAL HIGH (ref 4.0–10.5)

## 2016-03-09 LAB — GLUCOSE, CAPILLARY
GLUCOSE-CAPILLARY: 118 mg/dL — AB (ref 65–99)
Glucose-Capillary: 111 mg/dL — ABNORMAL HIGH (ref 65–99)
Glucose-Capillary: 117 mg/dL — ABNORMAL HIGH (ref 65–99)
Glucose-Capillary: 75 mg/dL (ref 65–99)

## 2016-03-09 LAB — PROCALCITONIN: Procalcitonin: 44.49 ng/mL

## 2016-03-09 LAB — TROPONIN I: Troponin I: 2.78 ng/mL (ref <0.03)

## 2016-03-09 LAB — HEPARIN LEVEL (UNFRACTIONATED): Heparin Unfractionated: 0.33 IU/mL (ref 0.30–0.70)

## 2016-03-09 MED ORDER — LIDOCAINE-PRILOCAINE 2.5-2.5 % EX CREA: 1.0000 "application " | TOPICAL_CREAM | CUTANEOUS | Status: DC | PRN

## 2016-03-09 MED ORDER — LIDOCAINE HCL (PF) 1 % IJ SOLN: 5.0000 mL | INTRAMUSCULAR | Status: DC | PRN

## 2016-03-09 MED ORDER — ALTEPLASE 2 MG IJ SOLR: 2.0000 mg | Freq: Once | INTRAMUSCULAR | Status: DC | PRN

## 2016-03-09 MED ORDER — SODIUM CHLORIDE 0.9 % IV SOLN: 100.0000 mL | INTRAVENOUS | Status: DC | PRN

## 2016-03-09 MED ORDER — PENTAFLUOROPROP-TETRAFLUOROETH EX AERO: 1.0000 "application " | INHALATION_SPRAY | CUTANEOUS | Status: DC | PRN

## 2016-03-09 MED ORDER — HEPARIN SODIUM (PORCINE) 1000 UNIT/ML DIALYSIS: 1000.0000 [IU] | INTRAMUSCULAR | Status: DC | PRN

## 2016-03-09 MED ORDER — CALCITRIOL 0.25 MCG PO CAPS
ORAL_CAPSULE | ORAL | Status: AC
  Administered 2016-03-09: 0.25 ug via ORAL
  Filled 2016-03-09: qty 1

## 2016-03-09 MED ORDER — VANCOMYCIN HCL IN DEXTROSE 750-5 MG/150ML-% IV SOLN
INTRAVENOUS | Status: AC
  Administered 2016-03-09: 750 mg via INTRAVENOUS
  Filled 2016-03-09: qty 150

## 2016-03-09 NOTE — Progress Notes (Signed)
Dialysis treatment completed.  500 mL ultrafiltrated and net fluid removal 0 mL.    Patient status unchanged. Lung sounds diminished and clear to ausculation in all fields. No edema. Cardiac: NSR.  Disconnected lines and removed needles.  Pressure held for 10 minutes and band aid/gauze dressing applied.  Report given to bedside RN, Rip Harbour.

## 2016-03-09 NOTE — Progress Notes (Addendum)
     Pre-op for surgery 03/10/2016 ligation left thigh graft place new thigh graft and catheter .  COLLINS, EMMA MAUREEN PA-C   After discussion with palliative care it sounds like patient is leaning toward comfort measures. Will cancel case tomorrow.   Brandon C. Donzetta Matters, MD [facility] Office: [phone] Pager: [phone]

## 2016-03-09 NOTE — Progress Notes (Signed)
[facility] Progress Note   Subjective:  Moved to ICU, on neo now. More alert  Vitals:   03/09/16 1600 03/09/16 1637 03/09/16 1700 03/09/16 1800  BP: (!) 112/52  (!) 115/56 (!) 121/54  Pulse: 91  95 91  Resp: 17  (!) 9 20  Temp:  98.9 F (37.2 C)  99.1 F (37.3 C)  TempSrc:  Oral  Oral  SpO2: 94%  100% 99%  Weight:      Height:        Inpatient medications: . calcitRIOL  0.25 mcg Oral Q T,Th,Sa-HD  . calcium acetate  667 mg Oral TID WC  . ceFEPime (MAXIPIME) IV  1 g Intravenous Q24H  . darbepoetin (ARANESP) injection - DIALYSIS  60 mcg Intravenous Q Thu-HD  . gabapentin  300 mg Oral QPM  . insulin aspart  0-5 Units Subcutaneous QHS  . insulin aspart  0-9 Units Subcutaneous TID WC  . insulin detemir  5 Units Subcutaneous QHS  . levothyroxine  175 mcg Oral QAC breakfast  . multivitamin  1 tablet Oral QHS  . senna-docusate  1 tablet Oral Daily  . sodium chloride flush  3 mL Intravenous Q12H  . tacrolimus  2 mg Oral BID   . dextrose 10 mL/hr at 03/09/16 1400  . heparin 1,400 Units/hr (03/09/16 1800)  . phenylephrine (NEO-SYNEPHRINE) Adult infusion Stopped (03/08/16 1000)   acetaminophen, acetaminophen, metoprolol, morphine injection, ondansetron (ZOFRAN) IV, ondansetron, oxyCODONE, vancomycin  Exam: General: alert, responsive, calm No jvd Heart: RRR on tel. mech clinking / no rub or gal.  Lungs: cta  Abdomen: bs pos soft nt ,nd Extremities: L trace pedal edema Skin: bilat skin darkening of L>R foot / L heal dried ulcer Neuro: alert / moves all extrem . No overt focal deficits  Dialysis Access: Pos bruit L FEM AVGG  Dialysis: [name] MWF 3h 22min  59.5kg   Hep none   L fem AVG Po calcitriol 0.74mcg /HD  MIrcera 60 mcg  q 2wks   *was to start 03/05/16" Other op labs hgb 9.8  Ca 9.3 phos 8.7  PTH 1294  Assessment: 1. PAD/ L foot ischemia - for L thigh AVG ligation when stable per VVS 2. Fever / hypotension - resolved, unclear source. 3. ESRD - HD  today, postponed from Fri due to hypotension.  4. Aflutter/ RVR - back in NSR 5. Chest Pain / CAD ho / AOR - card consulted "demand ischemia " 6. Volume - up 2 kg by wts, no gross excess on exam 7. Anemia  - hgb 9.8  Start ESA with HD  8. 2HPTH - cont vit D/ phoslo 9. HO Liver transplant sec Hep c- on meds 10. DM  Type 2 - per admit  11. COPD  Plan - as above, HD today , no fluid off   [name] MD [facility] pager [phone]   03/09/2016, 6:51 PM    Recent Labs Lab 03/07/16 0425 03/08/16 0230 03/09/16 0433  NA 133* 134* 133*  K 4.5 4.2 4.3  CL 94* 97* 93*  CO2 27 26 23   GLUCOSE 131* 107* 115*  BUN 28* 40* 47*  CREATININE 6.49* 7.94* 9.12*  CALCIUM 8.4* 8.0* 7.7*    Recent Labs Lab 03/07/16 0425  AST 33  ALT 16  ALKPHOS 314*  BILITOT 3.0*  PROT 5.8*  ALBUMIN 1.9*    Recent Labs Lab 03/07/16 0425 03/08/16 0230 03/09/16 0433  WBC 16.0* 25.3* 16.7*  HGB 8.2* 7.6* 7.5*  HCT 25.1* 23.1* 22.2*  MCV 96.5 96.3 95.7  PLT 157 150 170   Iron/TIBC/Ferritin/ %Sat    Component Value Date/Time   FERRITIN 1,095 (H) 08/30/2012 1449

## 2016-03-09 NOTE — Progress Notes (Signed)
Arrived to patient room 13M-07 at 1045.  Reviewed treatment plan and this RN agrees.  Report received from bedside RN, Rip Harbour.  Consent verified.  Patient A & O X 4. Lung sounds clear and diminished to ausculation in all fields. No edema. Cardiac: NSR.  Prepped L thigh AVG with alcohol and cannulated with two 15 gauge needles.  Pulsation of blood noted.  Flushed access well with saline per protocol.  Connected and secured lines and initiated tx at 1115.  UF goal of 500 mL and net fluid removal of 0 mL.  Will continue to monitor.

## 2016-03-09 NOTE — Consult Note (Signed)
Name: [name] MRN: [id number] DOB: 1955/10/22    ADMISSION DATE:  03/05/2016 CONSULTATION DATE:  1/26  REFERRING MD : Dr. 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 Presents to ED on 1/24 from HD with A.Flutter HR of 180. Patient reported left foot pain in which she is being followed by vascular. Upon arrival to ED patient reports chest pain and had pos troponins as well as a small left pleural effusion on chest xray. Cardiology was consulted and believed that elevated trops were due to ischemic demand in setting of a possible infection. During stay patient has been febrile, lactic acid 1.5, and pending blood cultures. Patient was admitted to step-down under triad. On 1/26 patient became increasingly lethargic and hypotensive. PCCM was asked to consult.   On arrival to unit daughter was at bedside and verified with patient that she does not wish to be intubated, wear BIPAP, or have CPR. However, is okay with central line placement and vasoactive medications if needed.   SIGNIFICANT EVENTS  1/24 > Presented to ED with chest pain and aflutter  1/26 > PCCM consulted for hypotension, transferred to ICU  STUDIES:  1/24 > Left Lower Ext Duplex > Neg acute  1/26 > CXR > diffuse interstitial densities, edema vs pna  1/27 > CXR > Diffuse B/L infiltrates. Images reviewed  Microbiology:  Blood 1/25 > NGTD Blood 1/26 >  Staph aureus Flu A pcr 1/25 > Negative  Antibiotics:  Cefepime 1/26 > Vancomycin 1/26 >   SUBJECTIVE:  Started on peripheral Neo-Synephrine yesterday.  Currently off pressors and BP currently stable.  She reports feeling tired this morning.  PHYSICAL EXAMINATION: Blood pressure (!) 116/54, pulse 67, temperature 98.1 F (36.7 C), temperature source Oral, resp. rate 14, height 5\' 4"  (1.626 m), weight 139 lb 15.9 oz (63.5 kg), SpO2  100 %. Gen:      No acute distress HEENT:  EOMI, sclera anicteric Lungs:    Clear to auscultation bilaterally; normal respiratory effort CV:         Regular rate and rhythm; no murmurs Abd:      + bowel sounds; soft, non-tender; no palpable masses, no distension Ext:    No edema; adequate peripheral perfusion Skin:      Warm and dry; no rash Neuro: alert and oriented x 3 Psych: normal mood and affect   Recent Labs Lab 03/07/16 0425 03/08/16 0230 03/09/16 0433  NA 133* 134* 133*  K 4.5 4.2 4.3  CL 94* 97* 93*  CO2 27 26 23   BUN 28* 40* 47*  CREATININE 6.49* 7.94* 9.12*  GLUCOSE 131* 107* 115*    Recent Labs Lab 03/07/16 0425 03/08/16 0230 03/09/16 0433  HGB 8.2* 7.6* 7.5*  HCT 25.1* 23.1* 22.2*  WBC 16.0* 25.3* 16.7*  PLT 157 150 170   Dg Chest Port 1 View  Result Date: 03/09/2016 CLINICAL DATA:  Acute respiratory failure EXAM: PORTABLE CHEST 1 VIEW COMPARISON:  03/07/2016 FINDINGS: Prior median sternotomy and valve replacement. Heart is borderline in size. Diffuse bilateral airspace disease and small bilateral effusions. Airspace disease slightly improved since prior study. IMPRESSION: Diffuse bilateral airspace disease, slightly improved, likely improving edema. Small bilateral effusions. Electronically Signed   By: Rolm Baptise M.D.   On: 03/09/2016 07:25   Dg Chest [facility] 1 View  Result  Date: 03/07/2016 CLINICAL DATA:  Fever, hypoxemia. EXAM: PORTABLE CHEST 1 VIEW COMPARISON:  Radiographs of March 05, 2016. FINDINGS: Stable cardiomediastinal silhouette. Status post aortic valve repair. Atherosclerosis of thoracic aorta is noted. No pneumothorax is noted. Interval development of diffuse interstitial densities throughout both lungs concerning for edema or possibly pneumonia. Mild left pleural effusion is noted. Bony thorax is unremarkable. IMPRESSION: Aortic atherosclerosis. Diffuse interstitial densities are noted throughout both lungs concerning for edema or possibly  pneumonia. Mild left pleural effusion. Electronically Signed   By: Marijo Conception, M.D.   On: 03/07/2016 09:29   Dg Foot 2 Views Left  Result Date: 03/07/2016 CLINICAL DATA:  Left foot tenderness. EXAM: LEFT FOOT - 2 VIEW COMPARISON:  None. FINDINGS: Extensive vascular calcifications throughout the foot. Plantar and posterior calcaneal spurs. No radiographic changes of osteomyelitis. No fracture, subluxation or dislocation. IMPRESSION: No acute bony abnormality.  Extensive vascular calcifications. Electronically Signed   By: Rolm Baptise M.D.   On: 03/07/2016 09:27    DISCUSSION: Admitted with Sepsis, staph aureus bacteremia.  Now in ICU for hypotension requiring pressors. 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  ASSESSMENT / PLAN:  PULMONARY A: Stable  CXR with B/L infiltrates concerning for HCAP, viral vs fluid overload RVP negative  P:   Wean down O2 as tolerated. Continue abx   CARDIOVASCULAR A:  Elevated TnI - likely demand ischemia in the setting of sepsis  Shock H/O CAD Diastolic HF P:  Lopressor  Appreciate cardiology's recommendations  RENAL A:   ESRD on HD P:   Appreciate nephrology's recommendations  GASTROINTESTINAL A:   Stable P:   PO diet  HEMATOLOGIC A:   Leukocytosis secondary to sepis - trending down Anemia of chronic disease - ESRD P:  Monitor CBC  INFECTIOUS A:   S aureus bacteremia  Leukocytosis - improving  P:   Continue vanco, cefepime Follow Pct, final cultures  ENDOCRINE A:   DM  P:   SSI, lantus  NEUROLOGIC A:   Stable P:   Monitor  FAMILY  - Updates: As per family discussion 1/27 she is a full DNR, no BiPAP but okay for pressors. No family at bedside today - Inter-disciplinary family meet or Palliative Care meeting due by:  Day 7

## 2016-03-09 NOTE — Progress Notes (Signed)
Subjective:  She is much more alert and feels much better today.  Talkative and conversant today and not somnolent.  No longer vomiting.  No chest pain.  No shortness of breath.  She was not able to have her myocardial perfusion scan yesterday but her grafts was patent at catheterization less than 2 years ago and I don't think that we really need to proceed with further ischemic imaging at this time unless she has recurrent symptoms.  Also for pressor agents now.  Objective:  Vital Signs in the last 24 hours: BP 133/63   Pulse 80   Temp 98.7 F (37.1 C) (Oral)   Resp 18   Ht 5\' 4"  (1.626 m)   Wt 65.1 kg (143 lb 8.3 oz)   SpO2 100%   BMI 24.64 kg/m   Physical Exam: Pleasant alert black female currently in no acute distress Lungs:  Clear Cardiac:  Regular rhythm, normal S1 and S2, no S3, valve sounds heard, 1/6 murmur of aortic regurgitation  Extremities: Prior failed dialysis grafts with poor peripheral pulses, no edema, ulcer on the left heel, no weakness  Intake/Output from previous day: 01/27 0701 - 01/28 0700 In: 1050 [P.O.:360; I.V.:640; IV Piggyback:50] Out: 150 [Emesis/NG output:150]  Weight Filed Weights   03/07/16 1810 03/08/16 0429 03/09/16 0500  Weight: 63.5 kg (139 lb 15.9 oz) 63.5 kg (139 lb 15.9 oz) 65.1 kg (143 lb 8.3 oz)    Lab Results: Basic Metabolic Panel:  Recent Labs  03/08/16 0230 03/09/16 0433  NA 134* 133*  K 4.2 4.3  CL 97* 93*  CO2 26 23  GLUCOSE 107* 115*  BUN 40* 47*  CREATININE 7.94* 9.12*   CBC:  Recent Labs  03/08/16 0230 03/09/16 0433  WBC 25.3* 16.7*  HGB 7.6* 7.5*  HCT 23.1* 22.2*  MCV 96.3 95.7  PLT 150 170   Telemetry: Currently sinus rhythm with PACs personally reviewed   Assessment/Plan:  1.  Elevated troponin likely demand ischemia in the setting of sepsis and renal failure 2. End Stage renal disease on hemodialysis 3.  Previous aortic valve replacement with LIMA to the LAD previously patent 4.  Atrial  arrhythmia has converted to sinus rhythm  5.  Severe anemia improved  Recommendations:  Clinically she looks a lot better today.  EF was around 45% and prosthetic valve looked fine yesterday.  In light of her clinical improvement I think is fine to have her vascular procedure.  No additional cardiovascular workup is necessary at this time.     Kerry Hough  MD [facility]  03/09/2016, 8:30 AM

## 2016-03-09 NOTE — Consult Note (Signed)
Consultation Note Date: 03/09/2016   Patient Name: [name]  DOB: [birth year]  MRN: [id number]  Age / Sex: 61 y.o., female  PCP: No Pcp Per Patient Referring Physician: Rush Landmark, MD  Reason for Consultation: Establishing goals of care  HPI/Patient Profile: 61 y.o. female  with past medical history of liver transplant, DM2 w/ neuropathy, PVD, CAD s/p stent, ESRD on HD, AS, COPD, chronic back pain who was admitted on 03/05/2016 with aflutter and sepsis.  She was found to have staph bacteremia.  Her troponin was elevated likely due to demand ischemia.  She was experiencing LLE pain.  Vascular was consulted.  They do not feel the foot is the source of her infection, but they did offer to ligate the left thigh graft and place a new left thigh graft and catheter on Monday.   Clinical Assessment and Goals of Care:  I have reviewed medical records including imaging, labs, and epic notes, received report from CCM resident, assessed the patient and then met at the bedside with the patient.  She was lethargic and our conversation was limited.  She did tell me her daughter, [name], helps her make medical decisions.  I spoke with [name] on the phone to discuss diagnosis prognosis, [place], EOL wishes, disposition and options.  I introduced Palliative Medicine as specialized medical care for people living with serious illness. It focuses on providing relief from the symptoms and stress of a serious illness. The goal is to improve quality of life for both the patient and the family.  [name] is sleepy.  She does tell me that she feels like she needs hemodialysis as she feels like she has excess fluid in her stomach.  [name] lives in [place] with [name].  [name] share care giving for their mother.  [name] relates that her mother does not want any further invasive procedures.    Specifically she does not want the stent procedure currently being offered to her.  [name] tells me that her mother has "had enough" and that "she is ready".  [name] has been working to come to grips with the fact that her mother does not want any further interventions and that her time is therefore very limited.  The patient's mother was on dialysis and died when she decided to stop dialysis.  [name] feels that her mother may be close to stopping dialysis - particularly if it causes her pain or discomfort.  The priority is comfort.  Treat the treatable with non-invasive measures, but focus on comfort.  We discussed home [place] - for the future time when [name] decides to stop hemodialysis.  The family was encouraged to call with questions or concerns.  PMT will continue to support holistically.    Primary Decision Maker:  PATIENT and [name] [phone]).   [name] works 7p to 7a and sleeps during the day.  However she will be in the Hospital at approximately 9:00 am on Monday.  SUMMARY OF RECOMMENDATIONS    Patient does not want vascular stenting procedure currently being offered. Treat the treatable with non-invasive measures, but comfort is the priority now.  Code Status/Advance Care Planning:  DNR    Symptom Management:   Per primary team.  Additional Recommendations (Limitations, Scope, Preferences):  Avoid Hospitalization, Minimize Medications and No Surgical Procedures   Prognosis:   < 3 months given aflutter/heart strain, severe infection, desire for comfort over aggressive measures.  Once she stops hemodialysis prognosis changes to less than 2 weeks.  Discharge Planning: To Be Determined      Primary Diagnoses: Present on Admission: . CAD (coronary artery disease), native coronary artery . (Resolved) Chest pain . Chronic hepatitis C without hepatic coma ([place]) . COPD (chronic obstructive pulmonary disease)  ([place]) . Hypertensive heart disease . GERD . (Resolved) Paroxysmal supraventricular tachycardia, s/p valve replacement, with chest pain. resolved as outpatient . Elevated troponin . (Resolved) Leg pain   I have reviewed the medical record, interviewed the patient and family, and examined the patient. The following aspects are pertinent.  Past Medical History:  Diagnosis Date  . Anemia    takes Folic Acid daily  . Anxiety   . Aortic stenosis   . Arthritis    "left hand, back" (08/30/2012)  . Asthma   . CAD (coronary artery disease)   . CAD (coronary artery disease) Jan. 2015   Cath: 20% LAD, 50% D1; s/p LIMA-LAD  . Chest pain 03/06/2015  . Chronic back pain   . Chronic bronchitis ([place])    "q yr w/season changes" (08/30/2012)  . Chronic constipation    takes MIralax and Colace daily  . COPD (chronic obstructive pulmonary disease) ([place])   . Depression    takes Cymbalta for "severe" depression  . End stage renal disease on dialysis [facility]) 02/27/2011   Kidneys shut down at time of liver transplant in Sept 2011 at [facility] in [place], she has been on HD ever since.  Dialyzes at [facility] HD on TTS schedule.  Had L forearm graft used 10 months then removed Dec 2012 due to suspected infection.  A right upper arm AVG was placed Dec 2012 but she developed steal symptoms acutely and it was ligated the same day.  Never had an AV fistula due to small veins.  Now has L thigh AVG put in Jan 2013, has not clotted to date.    [name] GERD (gastroesophageal reflux disease)    takes Omeprazole daily  . Headache    "at least monthly" (08/30/2012)  . Hepatitis C   . History of blood transfusion    "several" (08/30/2012)  . Hypertension    takes Metoprolol and Lisinopril daily, sees Dr [name]  . Hypothyroidism    takes Synthroid daily  . Migraine    "last migraine was in 2013" (08/30/2012)  . Neuromuscular disorder ([place])    carpal tunnel in right hand  . Obesity   . Peripheral vascular disease (HCC) hands  and legs  . Pneumonia    "today and several times before" (08/30/2012)  . 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 SVT (supraventricular tachycardia) ([place]) 06/09/14  . Tobacco abuse   . Type II diabetes mellitus (HCC)    Levemir 2units daily if > 150   Social History   Social History  .  Marital status: Divorced    Spouse name: N/A  . Number of children: N/A  . Years of education: N/A   Social History Main Topics  . Smoking status: Current Every Day Smoker    Packs/day: 0.50    Years: 40.00    Types: Cigarettes  . Smokeless tobacco: Never Used     Comment: Almost 1 pk per day  . Alcohol use No  . Drug use: No  . Sexual activity: No   Other Topics Concern  . None   Social History Narrative  . None   Family History  Problem Relation Age of Onset  . Cancer Mother   . Diabetes Mother   . Hypertension Mother   . Stroke Mother   . Cancer Father   . Anesthesia problems Neg Hx   . Hypotension Neg Hx   . Malignant hyperthermia Neg Hx   . Pseudochol deficiency Neg Hx    Scheduled Meds: . calcitRIOL  0.25 mcg Oral Q T,Th,Sa-HD  . calcium acetate  667 mg Oral TID WC  . ceFEPime (MAXIPIME) IV  1 g Intravenous Q24H  . darbepoetin (ARANESP) injection - DIALYSIS  60 mcg Intravenous Q Thu-HD  . gabapentin  300 mg Oral QPM  . insulin aspart  0-5 Units Subcutaneous QHS  . insulin aspart  0-9 Units Subcutaneous TID WC  . insulin detemir  5 Units Subcutaneous QHS  . levothyroxine  175 mcg Oral QAC breakfast  . multivitamin  1 tablet Oral QHS  . senna-docusate  1 tablet Oral Daily  . sodium chloride flush  3 mL Intravenous Q12H  . tacrolimus  2 mg Oral BID   Continuous Infusions: . dextrose 20 mL/hr at 03/09/16 0500  . heparin 1,400 Units/hr (03/09/16 0500)  . phenylephrine (NEO-SYNEPHRINE) Adult infusion Stopped (03/08/16 1000)   PRN  Meds:.acetaminophen, acetaminophen, metoprolol, morphine injection, ondansetron (ZOFRAN) IV, ondansetron, oxyCODONE, vancomycin Allergies  Allergen Reactions  . Acetaminophen Other (See Comments)    Liver transplant recipient   . Codeine Itching  . Mirtazapine Other (See Comments)    hallucination  . Morphine Itching and Other (See Comments)    hallucinate   Review of Systems patient lethargic  Physical Exam  Pleasant wd but chronically ill appearing female.  Is too sleepy to answer more than a few simple questions CV rrr Resp no distress, clear Abdomen slight distention.  Non tender Extremities no edema, warm to touch  Vital Signs: BP 133/63   Pulse 80   Temp 98.7 F (37.1 C) (Oral)   Resp 18   Ht _0  (1.626 m)   Wt 65.1 kg (143 lb 8.3 oz)   SpO2 100%   BMI 24.64 kg/m  Pain Assessment: 0-10   Pain Score: 0-No pain   SpO2: SpO2: 100 % O2 Device:SpO2: 100 % O2 Flow Rate: .O2 Flow Rate (L/min): 2 L/min  IO: Intake/output summary:  Intake/Output Summary (Last 24 hours) at 03/09/16 0907 Last data filed at 03/09/16 0600  Gross per 24 hour  Intake              947 ml  Output              150 ml  Net              797 ml    LBM: Last BM Date: 03/05/16 (per patient) Baseline Weight: Weight: 59 kg (130 lb) Most recent weight: Weight: 65.1 kg (143 lb 8.3 oz)     Palliative Assessment/Data:  Flowsheet Rows   Flowsheet Row Most Recent Value  Intake Tab  Referral Department  Critical care  Unit at Time of Referral  ICU  Palliative Care Primary Diagnosis  Sepsis/Infectious Disease  Date Notified  03/08/16  Palliative Care Type  New Palliative care  Reason for referral  Clarify Goals of Care  Date of Admission  03/05/16  Date first seen by Palliative Care  03/09/16  # of days Palliative referral response time  1 Day(s)  # of days IP prior to Palliative referral  3  Clinical Assessment  Palliative Performance Scale Score  30%  Psychosocial & Spiritual Assessment   Palliative Care Outcomes  Patient/Family meeting held?  Yes  Who was at the meeting?  patient.  Called Dtr [name] on phone  Palliative Care Outcomes  Clarified goals of care  Patient/Family wishes: Interventions discontinued/not started   Other (Comment)      Time Total: 70 min. Greater than 50%  of this time was spent counseling and coordinating care related to the above assessment and plan.  Signed by: Imogene Burn, PA-C Palliative Medicine Pager: [phone]  Please contact Palliative Medicine Team phone at [phone] for questions and concerns.  For individual provider: See Shea Evans

## 2016-03-09 NOTE — Progress Notes (Signed)
Unable to complete H&P at present . No family at present.

## 2016-03-10 ENCOUNTER — Encounter (HOSPITAL_COMMUNITY)
Admission: EM | Disposition: A | Discharge status: Home / Self Care | Payer: Self-pay | Source: Home / Self Care | Attending: Internal Medicine

## 2016-03-10 DIAGNOSIS — Z7189 Other specified counseling: Secondary | ICD-10-CM

## 2016-03-10 DIAGNOSIS — B182 Chronic viral hepatitis C: Secondary | ICD-10-CM

## 2016-03-10 DIAGNOSIS — R748 Abnormal levels of other serum enzymes: Secondary | ICD-10-CM

## 2016-03-10 DIAGNOSIS — I2510 Atherosclerotic heart disease of native coronary artery without angina pectoris: Secondary | ICD-10-CM

## 2016-03-10 DIAGNOSIS — Z515 Encounter for palliative care: Secondary | ICD-10-CM

## 2016-03-10 DIAGNOSIS — Z944 Liver transplant status: Secondary | ICD-10-CM

## 2016-03-10 LAB — PROTIME-INR
INR: 1.69
PROTHROMBIN TIME: 20.1 s — AB (ref 11.4–15.2)

## 2016-03-10 LAB — CBC
HCT: 23.3 % — ABNORMAL LOW (ref 36.0–46.0)
Hemoglobin: 7.6 g/dL — ABNORMAL LOW (ref 12.0–15.0)
MCH: 31.7 pg (ref 26.0–34.0)
MCHC: 32.6 g/dL (ref 30.0–36.0)
MCV: 97.1 fL (ref 78.0–100.0)
Platelets: 172 10*3/uL (ref 150–400)
RBC: 2.4 MIL/uL — ABNORMAL LOW (ref 3.87–5.11)
RDW: 15.7 % — AB (ref 11.5–15.5)
WBC: 20.5 10*3/uL — ABNORMAL HIGH (ref 4.0–10.5)

## 2016-03-10 LAB — HEPARIN LEVEL (UNFRACTIONATED): HEPARIN UNFRACTIONATED: 0.36 [IU]/mL (ref 0.30–0.70)

## 2016-03-10 LAB — CULTURE, BLOOD (ROUTINE X 2)

## 2016-03-10 LAB — GLUCOSE, CAPILLARY
GLUCOSE-CAPILLARY: 92 mg/dL (ref 65–99)
Glucose-Capillary: 86 mg/dL (ref 65–99)
Glucose-Capillary: 121 mg/dL — ABNORMAL HIGH (ref 65–99)
Glucose-Capillary: 92 mg/dL (ref 65–99)

## 2016-03-10 LAB — BASIC METABOLIC PANEL
Anion gap: 12 (ref 5–15)
BUN: 16 mg/dL (ref 6–20)
CO2: 26 mmol/L (ref 22–32)
CREATININE: 5.21 mg/dL — AB (ref 0.44–1.00)
Calcium: 8.3 mg/dL — ABNORMAL LOW (ref 8.9–10.3)
Chloride: 96 mmol/L — ABNORMAL LOW (ref 101–111)
GFR calc Af Amer: 9 mL/min — ABNORMAL LOW (ref >60)
GFR, EST NON AFRICAN AMERICAN: 8 mL/min — AB (ref >60)
Glucose, Bld: 107 mg/dL — ABNORMAL HIGH (ref 65–99)
Potassium: 3.7 mmol/L (ref 3.5–5.1)
SODIUM: 134 mmol/L — AB (ref 135–145)

## 2016-03-10 LAB — PROCALCITONIN: PROCALCITONIN: 38.1 ng/mL

## 2016-03-10 SURGERY — LIGATION ARTERIOVENOUS GORTEX GRAFT
Anesthesia: Choice | Laterality: Right

## 2016-03-10 MED ORDER — MORPHINE SULFATE (PF) 4 MG/ML IV SOLN
2.0000 mg | INTRAVENOUS | Status: DC | PRN
Start: 2016-03-10 — End: 2016-03-10
  Administered 2016-03-10 (×5): 2 mg via INTRAVENOUS
  Filled 2016-03-10 (×4): qty 1

## 2016-03-10 MED ORDER — MORPHINE SULFATE (PF) 4 MG/ML IV SOLN
2.0000 mg | INTRAVENOUS | Status: DC | PRN
  Administered 2016-03-10 – 2016-03-14 (×7): 2 mg via INTRAVENOUS
  Filled 2016-03-10 (×8): qty 1

## 2016-03-10 MED ORDER — OXYCODONE HCL 5 MG PO TABS
ORAL_TABLET | ORAL | Status: AC
  Administered 2016-03-10: 5 mg via ORAL
  Filled 2016-03-10: qty 1

## 2016-03-10 MED ORDER — VANCOMYCIN HCL IN DEXTROSE 750-5 MG/150ML-% IV SOLN
750.0000 mg | INTRAVENOUS | Status: AC | PRN
  Administered 2016-03-10: 750 mg via INTRAVENOUS

## 2016-03-10 MED ORDER — MORPHINE SULFATE (PF) 4 MG/ML IV SOLN
INTRAVENOUS | Status: AC
  Administered 2016-03-10: 2 mg via INTRAVENOUS
  Filled 2016-03-10: qty 1

## 2016-03-10 MED ORDER — VANCOMYCIN HCL IN DEXTROSE 750-5 MG/150ML-% IV SOLN
INTRAVENOUS | Status: AC
  Administered 2016-03-10: 750 mg via INTRAVENOUS
  Filled 2016-03-10: qty 150

## 2016-03-10 NOTE — Telephone Encounter (Signed)
With the patient was seen by me in May.  She had previously been hospitalized on 2 additional occasions prior to that office visit at [facility].  I was unaware of her oxygen use

## 2016-03-10 NOTE — Progress Notes (Addendum)
[facility] Progress Note   Dialysis: [name] MWF 3h 66min  59.5kg   Hep none   L fem AVG Po calcitriol 0.86mcg /HD MIrcera 60 mcg q 2wks *was to start 03/05/16" Other op labs hgb 9.8 Ca 9.3 phos 8.7 PTH 1294  Assessment/ Plan:   1. PAD/ L foot ischemia - for L thigh AVG ligation tomorrow w/ cath insertion by VVS to hopefully improve wound healing on the left heel. 2. Fever / hypotension - resolved, unclear source. 3. ESRD - HD Sun - Plan on HD today as well given she's going to the OR tomorrow; usually she's MWF. - No UF. 4. Aflutter/ RVR - back in NSR 5. Chest Pain / CAD ho / AOR - card consulted "demand ischemia " 6. Volume - up 2 kg by wts, no gross excess on exam 7. Anemia - hgb 9.8 Start ESA with HD  8. 2HPTH - cont vit D/ phoslo 9. HO Liver transplant sec Hep c- on meds 10. DM Type 2 - per admit  11. COPD  Subjective:   C/o pain in the left heel. Denies f/c/n/v. Asking for pain medications for the left heel.  No abd pain or diarrhea.  Seen on HD on 03/10/16 @234pm  Lt femoral AVG 3/2.5 bath 110/65 VSS w/ no complaints qb400 qd 800 UF 570ml net UF  Otelia Santee, MD   Objective:   BP (!) 116/57   Pulse 87   Temp 98.4 F (36.9 C) (Oral)   Resp 15   Ht 5\' 4"  (1.626 m)   Wt 61.5 kg (135 lb 9.3 oz)   SpO2 98%   BMI 23.27 kg/m   Intake/Output Summary (Last 24 hours) at 03/10/16 1231 Last data filed at 03/10/16 1100  Gross per 24 hour  Intake              887 ml  Output                0 ml  Net              887 ml   Weight change: -3.6 kg (-7 lb 15 oz)  Physical Exam: General: alert, responsive, in discomfort from the heel wound No jvd Heart: RRR on tel. mech clinking / no rub or gal.  Lungs: cta  Abdomen: bs pos soft nt ,nd Extremities: L trace pedal edema Skin: bilat skin darkening of L>R foot / L heal dried ulcer Neuro: alert / moves all extrem . No overt focal deficits  Dialysis Access: Pos bruit L FEM AVG  Imaging: Dg Chest Port  1 View  Result Date: 03/09/2016 CLINICAL DATA:  Acute respiratory failure EXAM: PORTABLE CHEST 1 VIEW COMPARISON:  03/07/2016 FINDINGS: Prior median sternotomy and valve replacement. Heart is borderline in size. Diffuse bilateral airspace disease and small bilateral effusions. Airspace disease slightly improved since prior study. IMPRESSION: Diffuse bilateral airspace disease, slightly improved, likely improving edema. Small bilateral effusions. Electronically Signed   By: Rolm Baptise M.D.   On: 03/09/2016 07:25    Labs: BMET  Recent Labs Lab 03/05/16 0927 03/06/16 0248 03/07/16 0425 03/08/16 0230 03/09/16 0433 03/09/16 1940 03/10/16 0150  NA 134* 136 133* 134* 133* 134* 134*  K 5.2* 6.6* 4.5 4.2 4.3 3.4* 3.7  CL 94* 95* 94* 97* 93* 97* 96*  CO2 25 26 27 26 23 28 26   GLUCOSE 167* 139* 131* 107* 115* 106* 107*  BUN 43* 56* 28* 40* 47* 15 16  CREATININE 8.11* 9.34*  6.49* 7.94* 9.12* 4.49* 5.21*  CALCIUM 8.3* 8.3* 8.4* 8.0* 7.7* 8.0* 8.3*  PHOS  --   --   --   --   --  4.5  --    CBC  Recent Labs Lab 03/08/16 0230 03/09/16 0433 03/09/16 1940 03/10/16 0150  WBC 25.3* 16.7* 18.3* 20.5*  HGB 7.6* 7.5* 7.5* 7.6*  HCT 23.1* 22.2* 22.6* 23.3*  MCV 96.3 95.7 96.2 97.1  PLT 150 170 144* 172    Medications:    . calcitRIOL  0.25 mcg Oral Q T,Th,Sa-HD  . calcium acetate  667 mg Oral TID WC  . ceFEPime (MAXIPIME) IV  1 g Intravenous Q24H  . darbepoetin (ARANESP) injection - DIALYSIS  60 mcg Intravenous Q Thu-HD  . gabapentin  300 mg Oral QPM  . insulin aspart  0-5 Units Subcutaneous QHS  . insulin aspart  0-9 Units Subcutaneous TID WC  . insulin detemir  5 Units Subcutaneous QHS  . levothyroxine  175 mcg Oral QAC breakfast  . multivitamin  1 tablet Oral QHS  . senna-docusate  1 tablet Oral Daily  . sodium chloride flush  3 mL Intravenous Q12H  . tacrolimus  2 mg Oral BID      Otelia Santee, MD 03/10/2016, 12:31 PM

## 2016-03-10 NOTE — Progress Notes (Signed)
Patient arrived to unit per bed.  Reviewed treatment plan and this RN agrees.  Report received from bedside RN, Carmell Austria.  Consent verified.  Patient A & O X 4. Lung sounds clear and diminished to ausculation in all fields. No edema. Cardiac: NSR.  Prepped L thigh AVG with alcohol and cannulated with two 15 gauge needles.  Pulsation of blood noted.  Flushed access well with saline per protocol.  Connected and secured lines and initiated tx at 1332.  UF goal of 1000 mL and net fluid removal of 500 mL.  Will continue to monitor.

## 2016-03-10 NOTE — Consult Note (Addendum)
[facility]  Subjective  - left heel hurts   Objective (!) 116/57 87 98.4 F (36.9 C) (Oral) 15 98%  Intake/Output Summary (Last 24 hours) at 03/10/16 1215 Last data filed at 03/10/16 1100  Gross per 24 hour  Intake              887 ml  Output                0 ml  Net              887 ml   Pt still complains of pain in her left heel worse today than is was when I saw her several days ago Left heel still no fluctuance or erythema, 2 cm dry black eschcar  Assessment/Planning: Appears to be improving from septic episode over the weekend.  Still has pain in left heel.  No change in appearance no obvious infection.  Have discussed with pt and daughter that even with ligation of graft relief of pain may not be instantaneous and heel wound may not heal despite this as this is a notoriously difficult place to heal and is usually decubitus in nature due to inactivity.  In light of her overall debilitated condition will ligate graft left leg tomorrow and place catheter.  If her overall condition improves will place right thigh graft a later interval  Plan discussed with daughter and patient as well as the points above and they wish to proceed.  NPO p midnight Consent Heparin off on call to OR Will check serum albumin to gauge nutritional status for wound healing  Ruta Hinds 03/10/2016 12:15 PM --  Laboratory Lab Results:  Recent Labs  03/09/16 1940 03/10/16 0150  WBC 18.3* 20.5*  HGB 7.5* 7.6*  HCT 22.6* 23.3*  PLT 144* 172   BMET  Recent Labs  03/09/16 1940 03/10/16 0150  NA 134* 134*  K 3.4* 3.7  CL 97* 96*  CO2 28 26  GLUCOSE 106* 107*  BUN 15 16  CREATININE 4.49* 5.21*  CALCIUM 8.0* 8.3*    COAG Lab Results  Component Value Date   INR 1.69 03/10/2016   INR 1.39 03/07/2016   INR 1.81 03/05/2016   No results found for: PTT

## 2016-03-10 NOTE — Progress Notes (Signed)
ANTICOAGULATION + ANTIMICROBIAL CONSULT NOTE  Pharmacy Consult for heparin Indication: atrial flutter, AVR   Allergies  Allergen Reactions  . Acetaminophen Other (See Comments)    Liver transplant recipient   . Codeine Itching  . Mirtazapine Other (See Comments)    hallucination  . Morphine Itching and Other (See Comments)    hallucinate    Patient Measurements: Height: 5\' 4"  (162.6 cm) Weight: 135 lb 9.3 oz (61.5 kg) IBW/kg (Calculated) : 54.7 Heparin Dosing Weight: 59 kg  Vital Signs: Temp: 98.4 F (36.9 C) (01/29 1118) Temp Source: Oral (01/29 1118) BP: 132/72 (01/29 1300) Pulse Rate: 87 (01/29 1300)  Labs:  Recent Labs  03/08/16 0230 03/09/16 0433 03/09/16 1024 03/09/16 1940 03/10/16 0150  HGB 7.6* 7.5*  --  7.5* 7.6*  HCT 23.1* 22.2*  --  22.6* 23.3*  PLT 150 170  --  144* 172  LABPROT  --   --   --   --  20.1*  INR  --   --   --   --  1.69  HEPARINUNFRC 0.34 0.33  --   --  0.36  CREATININE 7.94* 9.12*  --  4.49* 5.21*  TROPONINI  --   --  2.78*  --   --     Estimated Creatinine Clearance: 9.8 mL/min (by C-G formula based on SCr of 5.21 mg/dL (H)).  Assessment: Pharmacy consulted to dose heparin bridge for atrial flutter and AVR. Pt on coumadin PTA per coumadin clinic. Home dose is 2 mg daily, last dose 1/23. Last coumadin clinic visit 02/29/16 with INR 2.7, INR goal 2.5 - 3 per clinic notes. Remains on heparin perioperatively. Pt is being assess for ligation of L thigh graft. Heparin level remains therapeutic today. Hgb 7.6, plts wnl.  Pt is on day #4 of vancomycin and cefepime for HCAP. Pt is ESRD on HD-MWF, but is currently off schedule, she received HD yesterday and today. Blood cx from 1/25 grew 1/2 CoNS, repeat blood cultures are pending.  Vancomycin 1/26> Cefepime 1/26>  1/28 blood cx: 1/25 blood cx: CONS 1/2 Resp PCR   Goal of Therapy:  Heparin level 0.3-0.7 units/ml; INR 2.5-3 Monitor platelets by anticoagulation protocol: Yes    Plan: Continue Heparin infusion at current rate, 1400 units / hr Daily heparin level and CBC Monitor for s/sx bleeding Plan to turn off heparin tomorrow morning when pt is called to OR Continue cefepime 1g/24h Continue vancomycin 750 mg IV qHD Monitor HD schedule Monitor cultures, order VR as needed    Hughes Better, PharmD, BCPS Clinical Pharmacist 03/10/2016 1:38 PM

## 2016-03-10 NOTE — Progress Notes (Signed)
Daily Progress Note   Patient Name: [name]       Date: 03/10/2016 DOB: May 27, 1955  Age: 61 y.o. MRN#: [id number] Attending Physician: Oswald Hillock, MD Primary Care Physician: No PCP Per Patient Admit Date: 03/05/2016  Reason for Consultation/Follow-up: Establishing goals of care  Subjective: Met with patient, her daughters and sister at bedside. Patient now saying she would like to proceed with surgery. Says she thought she was having a heart surgery. Clarified patient's and family's understanding of surgery. They would like to proceed with surgery in hopes that it will improve patient's pain. While at bedside patient began crying and complained of severe pain in foot that is relieved temporarily by IV morphine but returns within an hour.   Review of Systems  Cardiovascular: Negative for chest pain.  Neurological: Positive for weakness.  All other systems reviewed and are negative.   Length of Stay: 4  Current Medications: Scheduled Meds:  . calcitRIOL  0.25 mcg Oral Q T,Th,Sa-HD  . calcium acetate  667 mg Oral TID WC  . ceFEPime (MAXIPIME) IV  1 g Intravenous Q24H  . darbepoetin (ARANESP) injection - DIALYSIS  60 mcg Intravenous Q Thu-HD  . gabapentin  300 mg Oral QPM  . insulin aspart  0-5 Units Subcutaneous QHS  . insulin aspart  0-9 Units Subcutaneous TID WC  . insulin detemir  5 Units Subcutaneous QHS  . levothyroxine  175 mcg Oral QAC breakfast  . multivitamin  1 tablet Oral QHS  . senna-docusate  1 tablet Oral Daily  . sodium chloride flush  3 mL Intravenous Q12H  . tacrolimus  2 mg Oral BID    Continuous Infusions: . dextrose 10 mL/hr at 03/10/16 0000  . heparin 1,400 Units/hr (03/10/16 0000)  . phenylephrine (NEO-SYNEPHRINE) Adult infusion Stopped (03/08/16  1000)    PRN Meds: acetaminophen, acetaminophen, metoprolol, morphine injection, ondansetron (ZOFRAN) IV, ondansetron, oxyCODONE, vancomycin  Physical Exam  Constitutional: She is oriented to person, place, and time. She appears well-developed and well-nourished.  Cardiovascular: Normal rate and regular rhythm.   Pulmonary/Chest: Effort normal and breath sounds normal.  Neurological: She is alert and oriented to person, place, and time.  Skin: Skin is warm and dry.  Nursing note and vitals reviewed.           Vital Signs:  BP (!) 158/61   Pulse 94   Temp 98.3 F (36.8 C) (Oral)   Resp (!) 26   Ht 5' 4"  (1.626 m)   Wt 61.5 kg (135 lb 9.3 oz)   SpO2 91%   BMI 23.27 kg/m  SpO2: SpO2: 91 % O2 Device: O2 Device: Nasal Cannula O2 Flow Rate: O2 Flow Rate (L/min): 1 L/min  Intake/output summary:  Intake/Output Summary (Last 24 hours) at 03/10/16 1019 Last data filed at 03/10/16 1000  Gross per 24 hour  Intake              941 ml  Output                0 ml  Net              941 ml   LBM: Last BM Date: 03/05/16 Baseline Weight: Weight: 59 kg (130 lb) Most recent weight: Weight: 61.5 kg (135 lb 9.3 oz)       Palliative Assessment/Data: PPS: 30%    Flowsheet Rows   Flowsheet Row Most Recent Value  Intake Tab  Referral Department  Critical care  Unit at Time of Referral  ICU  Palliative Care Primary Diagnosis  Sepsis/Infectious Disease  Date Notified  03/08/16  Palliative Care Type  New Palliative care  Reason for referral  Clarify Goals of Care  Date of Admission  03/05/16  Date first seen by Palliative Care  03/09/16  # of days Palliative referral response time  1 Day(s)  # of days IP prior to Palliative referral  3  Clinical Assessment  Palliative Performance Scale Score  30%  Psychosocial & Spiritual Assessment  Palliative Care Outcomes  Patient/Family meeting held?  Yes  Who was at the meeting?  patient.  Called Dtr Nira Conn on phone  Palliative Care Outcomes   Clarified goals of care  Patient/Family wishes: Interventions discontinued/not started   Other (Comment)      Patient Active Problem List   Diagnosis Date Noted  . Sepsis ([place])   . Hypotension   . Claudication of left lower extremity ([place])   . Elevated troponin 03/05/2016  . Pressure injury of skin 11/27/2015  . H/O unilateral nephrectomy 07/06/2015  . Paroxysmal SVT (supraventricular tachycardia) ([place]) 06/22/2014  . Complications due to renal dialysis device, implant, and graft 05/11/2014  . Acute blood loss anemia 03/31/2014  . Renal dialysis device, implant, or graft complication 81/27/5170  . Acute hyperkalemia   . S/P liver transplant ([place])   . Chronic hepatitis C without hepatic coma ([place])   . Pulmonary edema 01/03/2014  . Respiratory failure ([place]) 01/03/2014  . Long term current use of anticoagulant therapy 04/22/2013  . Chronic anticoagulation w/ coumadin, goal INR 2.5-3.0 w/ AVR 04/14/2013  . Tobacco abuse 02/28/2013  . COPD (chronic obstructive pulmonary disease) ([place]) 02/21/2013  . Chronic combined systolic and diastolic congestive heart failure ([place]) 02/06/2013  . Acute respiratory failure ([place]) 11/02/2012  . Acute pulmonary edema ([place]) 11/02/2012  . Acute on chronic systolic CHF (congestive heart failure) ([place]) 11/02/2012  . Dyslipidemia-LDL 104, not on statin with Hx of liver transplant 07/30/2012  . CAD (coronary artery disease), native coronary artery 07/27/2012  . History of prosthetic aortic valve replacement   . End stage renal disease on dialysis-TTS 02/27/2011  . Hypothyroidism 06/25/2006  . Type 2 diabetes mellitus with chronic kidney disease ([place]) 06/25/2006  . GERD 06/25/2006  . Anemia due to chronic renal failure    . Hypertensive heart disease  Palliative Care Assessment & Plan   Patient Profile: 61 y.o. female  with past medical history of liver transplant, DM2 w/ neuropathy, PVD, CAD s/p stent, ESRD on HD, AS, COPD, chronic back pain who was  admitted on 03/05/2016 with aflutter and sepsis.  She was found to have staph bacteremia.  Her troponin was elevated likely due to demand ischemia.  She was experiencing LLE pain.  Vascular was consulted.  They do not feel the foot is the source of her infection, but they did offer to ligate the left thigh graft and place a new left thigh graft and catheter on Monday. Patient initially refused surgery. However, since cardiology has cleared her, she desires surgery in hopes of decreasing pain.   Assessment/Recommendations/Plan   Reconsult VVS to clarify purpose of surgery and risks of complications with family  Patient wishes to proceed with surgery  Morphine increased to q1hr for better pain control   Code Status:  DNR  Prognosis:   Unable to determine  Discharge Planning:  To Be Determined  Care plan was discussed with patient's RN, patient, her daughter's and sisters.  Thank you for allowing the Palliative Medicine Team to assist in the care of this patient.   Total time: 45 minutes  Greater than 50%  of this time was spent counseling and coordinating care related to the above assessment and plan.  Mariana Kaufman, AGNP-C Palliative Medicine   Please contact Palliative Medicine Team phone at [phone] for questions and concerns.

## 2016-03-10 NOTE — Progress Notes (Signed)
Report called to [facility], RN on 6E where patient will transfer post-HD. HD nurse [facility] aware. Patient belongings including purse walked up to [place] by [employer] nursing student.

## 2016-03-10 NOTE — Progress Notes (Signed)
Dialysis treatment completed.  1000 mL ultrafiltrated and net fluid removal 500 mL.    Patient status unchanged. Lung sounds diminished and clear to ausculation in all fields. No edema. Cardiac: NSR.  Disconnected lines and removed needles.  Pressure held for 15 minutes and band aid/gauze dressing applied.  Report given to bedside RN, Urban Gibson.

## 2016-03-10 NOTE — Progress Notes (Signed)
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 Presents to ED on 1/24 from HD with A.Flutter HR of 180. Patient reported left foot pain in which she is being followed by vascular. Upon arrival to ED patient reports chest pain and had pos troponins as well as a small left pleural effusion on chest xray. Cardiology was consulted and believed that elevated trops were due to ischemic demand in setting of a possible infection. During stay patient has been febrile, lactic acid 1.5, and pending blood cultures. Patient was admitted to step-down under [facility]. On 1/26 patient became increasingly lethargic and hypotensive. [facility] M was consulted and patient transferred to ICU. Patient did not wish to be intubated on BiPAP. She was seen by vascular surgery plan for removal of left thigh graft and place new thigh graft and catheter. Initially patient and family refused  the procedure.    Subjective   This morning patient complains of chest pain.   Assessment/Plan:      1. Chest pain/CAD- patient has significant history of CAD, troponin was elevated. Cardiology was consulted, initially nuclear Myoview was planned but later cartilage canceled the procedure. At this time no additional workup is recommended. Continue when necessary morphine for pain 2. Coagulase negative staph bacteremia-blood cultures 1 out of 2 bottles grew quickly is negative staph, continue vancomycin. Repeat blood cultures ordered. Will follow the results 3. Healthcare associated pneumonia- patient diagnosed with healthcare associated pneumonia, started on vancomycin and cefepime. Will follow repeat blood culture results 4. Leukocytosis- she has persistent leukocytosis, today WBC 20.5. She has been afebrile, blood pressure stable. Continue  antibiotics for now. 5. 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 Continue Aranesp 6. Diabetes mellitus- blood glucose is stable, continue sliding scale insulin with NovoLog. 7. Chronic left lower extremity pain-patient was seen by vascular surgery, initially there was planned to remove the left thigh graft and replaced with high new left thigh graft and catheter. Patient and family had refused the procedure, this morning patient told her to care that she wants to have the procedure. Will reconsult vascular surgery. 8. COPD- stable. Continue duo nebs when necessary 9. ESRD on hemodialysis- followed by nephrology, on hemodialysis Tuesday Thursday and Saturday. 10. Chronic immunosuppression in the setting of liver transplantation/Hepatitis C- continue Prograf 11. Hypothyroidism-TSH 4.836, continue Synthroid.    DVT prophylaxis: SCDs  Code Status: DO NOT RESUSCITATE  Family Communication: No family present at bedside   Disposition Plan: Pending resolution of multiple medical problems.   Consultants:  PCCM  Vascular surgery  Cardiology  Palliative care  Procedures:  None  Continuous infusions . dextrose 10 mL/hr at 03/10/16 0000  . heparin 1,400 Units/hr (03/10/16 1130)  . phenylephrine (NEO-SYNEPHRINE) Adult infusion Stopped (03/08/16 1000)      Antibiotics:   Anti-infectives    Start     Dose/Rate Route Frequency Ordered Stop   03/09/16 1200  vancomycin (VANCOCIN) IVPB 750 mg/150 ml premix  Status:  Discontinued     750 mg 150 mL/hr over 60 Minutes Intravenous  Once 03/08/16 1452 03/08/16 1453   03/09/16 1200  vancomycin (VANCOCIN) IVPB 750 mg/150 ml premix     750 mg 150 mL/hr over 60 Minutes Intravenous Every Dialysis 03/08/16 1453     03/08/16  2200  ceFEPIme (MAXIPIME) 1 g in dextrose 5 % 50 mL IVPB     1 g 100 mL/hr over 30 Minutes Intravenous Every 24 hours 03/07/16 0743     03/08/16 1200  vancomycin (VANCOCIN) IVPB 750 mg/150 ml  premix  Status:  Discontinued     750 mg 150 mL/hr over 60 Minutes Intravenous Every T-Th-Sa (Hemodialysis) 03/07/16 0743 03/08/16 1452   03/07/16 0745  vancomycin (VANCOCIN) 1,500 mg in sodium chloride 0.9 % 500 mL IVPB     1,500 mg 250 mL/hr over 120 Minutes Intravenous  Once 03/07/16 0743 03/07/16 1244   03/07/16 0745  ceFEPIme (MAXIPIME) 2 g in dextrose 5 % 50 mL IVPB     2 g 100 mL/hr over 30 Minutes Intravenous  Once 03/07/16 0743 03/07/16 0944       Objective   Vitals:   03/10/16 0900 03/10/16 1000 03/10/16 1100 03/10/16 1118  BP: (!) 141/83 (!) 158/61 (!) 116/57   Pulse: 87 94 87   Resp: (!) 21 (!) 26 15   Temp:    98.4 F (36.9 C)  TempSrc:    Oral  SpO2: 100% 91% 98%   Weight:      Height:        Intake/Output Summary (Last 24 hours) at 03/10/16 1224 Last data filed at 03/10/16 1100  Gross per 24 hour  Intake              887 ml  Output                0 ml  Net              887 ml   Filed Weights   03/09/16 0500 03/09/16 1045 03/09/16 1415  Weight: 65.1 kg (143 lb 8.3 oz) 61.5 kg (135 lb 9.3 oz) 61.5 kg (135 lb 9.3 oz)     Physical Examination:  General exam: Appears calm and comfortable. Respiratory system: Clear to auscultation. Respiratory effort normal. Cardiovascular system:  RRR. No  murmurs, rubs, gallops. No pedal edema. GI system: Abdomen is nondistended, soft and nontender. No organomegaly.  Central nervous system. No focal neurological deficits. 5 x 5 power in all extremities. Skin: No rashes, lesions or ulcers. Psychiatry: Alert, oriented x 3.Judgement and insight appear normal. Affect normal.    Data Reviewed: I have personally reviewed following labs and imaging studies  CBG:  Recent Labs Lab 03/09/16 1139 03/09/16 1638 03/09/16 2230 03/10/16 0743 03/10/16 1117  GLUCAP 117* 118* 75 86 121*    CBC:  Recent Labs Lab 03/07/16 0425 03/08/16 0230 03/09/16 0433 03/09/16 1940 03/10/16 0150  WBC 16.0* 25.3* 16.7* 18.3* 20.5*   HGB 8.2* 7.6* 7.5* 7.5* 7.6*  HCT 25.1* 23.1* 22.2* 22.6* 23.3*  MCV 96.5 96.3 95.7 96.2 97.1  PLT 157 150 170 144* [id number]    Basic Metabolic Panel:  Recent Labs Lab 03/07/16 0425 03/08/16 0230 03/09/16 0433 03/09/16 1940 03/10/16 0150  NA 133* 134* 133* 134* 134*  K 4.5 4.2 4.3 3.4* 3.7  CL 94* 97* 93* 97* 96*  CO2 27 26 23 28 26   GLUCOSE 131* 107* 115* 106* 107*  BUN 28* 40* 47* 15 16  CREATININE 6.49* 7.94* 9.12* 4.49* 5.21*  CALCIUM 8.4* 8.0* 7.7* 8.0* 8.3*  PHOS  --   --   --  4.5  --     Recent Results (from the past 240 hour(s))  MRSA PCR Screening     Status: None   Collection  Time: 03/05/16  6:37 PM  Result Value Ref Range Status   MRSA by PCR NEGATIVE NEGATIVE Final    Comment:        The GeneXpert MRSA Assay (FDA approved for NASAL specimens only), is one component of a comprehensive MRSA colonization surveillance program. It is not intended to diagnose MRSA infection nor to guide or monitor treatment for MRSA infections.   Culture, blood (routine x 2)     Status: None (Preliminary result)   Collection Time: 03/06/16  5:48 AM  Result Value Ref Range Status   Specimen Description BLOOD LEFT HAND  Final   Special Requests BOTTLES DRAWN AEROBIC AND ANAEROBIC 10CC EA  Final   Culture NO GROWTH 3 DAYS  Final   Report Status PENDING  Incomplete  Culture, blood (routine x 2)     Status: None (Preliminary result)   Collection Time: 03/06/16  5:54 AM  Result Value Ref Range Status   Specimen Description BLOOD RIGHT HAND  Final   Special Requests BOTTLES DRAWN AEROBIC AND ANAEROBIC 10CC EA  Final   Culture NO GROWTH 3 DAYS  Final   Report Status PENDING  Incomplete  Culture, blood (Routine X 2) w Reflex to ID Panel     Status: None (Preliminary result)   Collection Time: 03/07/16  8:52 AM  Result Value Ref Range Status   Specimen Description BLOOD LEFT HAND  Final   Special Requests BOTTLES DRAWN AEROBIC ONLY 5CC  Final   Culture NO GROWTH 2 DAYS  Final    Report Status PENDING  Incomplete  Culture, blood (Routine X 2) w Reflex to ID Panel     Status: Abnormal   Collection Time: 03/07/16  8:52 AM  Result Value Ref Range Status   Specimen Description BLOOD RIGHT HAND  Final   Special Requests IN PEDIATRIC BOTTLE 3CC  Final   Culture  Setup Time   Final    GRAM POSITIVE COCCI IN CLUSTERS IN PEDIATRIC BOTTLE CRITICAL RESULT CALLED TO, READ BACK BY AND VERIFIED WITH: M MACCIA,PHARMD AT [id number] 03/08/16 BY L BENFIELD    Culture (A)  Final    STAPHYLOCOCCUS SPECIES (COAGULASE NEGATIVE) THE SIGNIFICANCE OF ISOLATING THIS ORGANISM FROM A SINGLE SET OF BLOOD CULTURES WHEN MULTIPLE SETS ARE DRAWN IS UNCERTAIN. PLEASE NOTIFY THE MICROBIOLOGY DEPARTMENT WITHIN ONE WEEK IF SPECIATION AND SENSITIVITIES ARE REQUIRED.    Report Status 03/10/2016 FINAL  Final  Blood Culture ID Panel (Reflexed)     Status: Abnormal   Collection Time: 03/07/16  8:52 AM  Result Value Ref Range Status   Enterococcus species NOT DETECTED NOT DETECTED Final   Listeria monocytogenes NOT DETECTED NOT DETECTED Final   Staphylococcus species DETECTED (A) NOT DETECTED Final    Comment: CRITICAL RESULT CALLED TO, READ BACK BY AND VERIFIED WITH: Ailene Rud AT [id number] 03/08/16 BY L BENFIELD    Staphylococcus aureus NOT DETECTED NOT DETECTED Final   Methicillin resistance NOT DETECTED NOT DETECTED Final   Streptococcus species NOT DETECTED NOT DETECTED Final   Streptococcus agalactiae NOT DETECTED NOT DETECTED Final   Streptococcus pneumoniae NOT DETECTED NOT DETECTED Final   Streptococcus pyogenes NOT DETECTED NOT DETECTED Final   Acinetobacter baumannii NOT DETECTED NOT DETECTED Final   Enterobacteriaceae species NOT DETECTED NOT DETECTED Final   Enterobacter cloacae complex NOT DETECTED NOT DETECTED Final   Escherichia coli NOT DETECTED NOT DETECTED Final   Klebsiella oxytoca NOT DETECTED NOT DETECTED Final   Klebsiella pneumoniae NOT DETECTED NOT DETECTED Final  Proteus species  NOT DETECTED NOT DETECTED Final   Serratia marcescens NOT DETECTED NOT DETECTED Final   Haemophilus influenzae NOT DETECTED NOT DETECTED Final   Neisseria meningitidis NOT DETECTED NOT DETECTED Final   Pseudomonas aeruginosa NOT DETECTED NOT DETECTED Final   Candida albicans NOT DETECTED NOT DETECTED Final   Candida glabrata NOT DETECTED NOT DETECTED Final   Candida krusei NOT DETECTED NOT DETECTED Final   Candida parapsilosis NOT DETECTED NOT DETECTED Final   Candida tropicalis NOT DETECTED NOT DETECTED Final  Respiratory Panel by PCR     Status: None   Collection Time: 03/08/16  9:45 AM  Result Value Ref Range Status   Adenovirus NOT DETECTED NOT DETECTED Final   Coronavirus 229E NOT DETECTED NOT DETECTED Final   Coronavirus HKU1 NOT DETECTED NOT DETECTED Final   Coronavirus NL63 NOT DETECTED NOT DETECTED Final   Coronavirus OC43 NOT DETECTED NOT DETECTED Final   Metapneumovirus NOT DETECTED NOT DETECTED Final   Rhinovirus / Enterovirus NOT DETECTED NOT DETECTED Final   Influenza A NOT DETECTED NOT DETECTED Final   Influenza B NOT DETECTED NOT DETECTED Final   Parainfluenza Virus 1 NOT DETECTED NOT DETECTED Final   Parainfluenza Virus 2 NOT DETECTED NOT DETECTED Final   Parainfluenza Virus 3 NOT DETECTED NOT DETECTED Final   Parainfluenza Virus 4 NOT DETECTED NOT DETECTED Final   Respiratory Syncytial Virus NOT DETECTED NOT DETECTED Final   Bordetella pertussis NOT DETECTED NOT DETECTED Final   Chlamydophila pneumoniae NOT DETECTED NOT DETECTED Final   Mycoplasma pneumoniae NOT DETECTED NOT DETECTED Final     Liver Function Tests:  Recent Labs Lab 03/07/16 0425 03/09/16 1940  AST 33  --   ALT 16  --   ALKPHOS 314*  --   BILITOT 3.0*  --   PROT 5.8*  --   ALBUMIN 1.9* 1.6*   No results for input(s): LIPASE, AMYLASE in the last 168 hours. No results for input(s): AMMONIA in the last 168 hours.  Cardiac Enzymes:  Recent Labs Lab 03/05/16 1529 03/05/16 2122  03/06/16 0248 03/09/16 1024  TROPONINI 1.38* 2.19* 3.48* 2.78*   BNP (last 3 results)  Recent Labs  04/21/15 0753 07/09/15 1733  BNP 2,040.6* 1,495.0*    ProBNP (last 3 results) No results for input(s): PROBNP in the last 8760 hours.    Studies: Dg Chest Port 1 View  Result Date: 03/09/2016 CLINICAL DATA:  Acute respiratory failure EXAM: PORTABLE CHEST 1 VIEW COMPARISON:  03/07/2016 FINDINGS: Prior median sternotomy and valve replacement. Heart is borderline in size. Diffuse bilateral airspace disease and small bilateral effusions. Airspace disease slightly improved since prior study. IMPRESSION: Diffuse bilateral airspace disease, slightly improved, likely improving edema. Small bilateral effusions. Electronically Signed   By: Rolm Baptise M.D.   On: 03/09/2016 07:25    Scheduled Meds: . calcitRIOL  0.25 mcg Oral Q T,Th,Sa-HD  . calcium acetate  667 mg Oral TID WC  . ceFEPime (MAXIPIME) IV  1 g Intravenous Q24H  . darbepoetin (ARANESP) injection - DIALYSIS  60 mcg Intravenous Q Thu-HD  . gabapentin  300 mg Oral QPM  . insulin aspart  0-5 Units Subcutaneous QHS  . insulin aspart  0-9 Units Subcutaneous TID WC  . insulin detemir  5 Units Subcutaneous QHS  . levothyroxine  175 mcg Oral QAC breakfast  . multivitamin  1 tablet Oral QHS  . senna-docusate  1 tablet Oral Daily  . sodium chloride flush  3 mL Intravenous Q12H  .  tacrolimus  2 mg Oral BID      Time spent: 25 minutes  [facility] Pager [phone]. If 7PM-7AM, please contact night-coverage at [email or web address], Office  [phone]  password [email or web address] 03/10/2016, 12:24 PM  LOS: 4 days

## 2016-03-11 ENCOUNTER — Encounter (HOSPITAL_COMMUNITY)
Admission: EM | Disposition: A | Discharge status: Home / Self Care | Payer: Self-pay | Source: Home / Self Care | Attending: Internal Medicine

## 2016-03-11 ENCOUNTER — Inpatient Hospital Stay (HOSPITAL_COMMUNITY): Payer: Medicare Other

## 2016-03-11 ENCOUNTER — Inpatient Hospital Stay (HOSPITAL_COMMUNITY): Payer: Medicare Other | Admitting: Certified Registered Nurse Anesthetist

## 2016-03-11 ENCOUNTER — Encounter (HOSPITAL_COMMUNITY): Payer: Medicare Other

## 2016-03-11 ENCOUNTER — Ambulatory Visit: Payer: Medicare Other | Admitting: Vascular Surgery

## 2016-03-11 DIAGNOSIS — I471 Supraventricular tachycardia: Secondary | ICD-10-CM

## 2016-03-11 DIAGNOSIS — T82898A Other specified complication of vascular prosthetic devices, implants and grafts, initial encounter: Secondary | ICD-10-CM

## 2016-03-11 HISTORY — PX: INSERTION OF DIALYSIS CATHETER: SHX1324

## 2016-03-11 HISTORY — PX: LIGATION ARTERIOVENOUS GORTEX GRAFT: SHX5947

## 2016-03-11 LAB — BASIC METABOLIC PANEL
Anion gap: 10 (ref 5–15)
BUN: 7 mg/dL (ref 6–20)
CHLORIDE: 99 mmol/L — AB (ref 101–111)
CO2: 26 mmol/L (ref 22–32)
Calcium: 9.1 mg/dL (ref 8.9–10.3)
Creatinine, Ser: 3.62 mg/dL — ABNORMAL HIGH (ref 0.44–1.00)
GFR calc non Af Amer: 13 mL/min — ABNORMAL LOW (ref >60)
GFR, EST AFRICAN AMERICAN: 15 mL/min — AB (ref >60)
Glucose, Bld: 66 mg/dL (ref 65–99)
POTASSIUM: 3.6 mmol/L (ref 3.5–5.1)
SODIUM: 135 mmol/L (ref 135–145)

## 2016-03-11 LAB — CBC
HCT: 27.1 % — ABNORMAL LOW (ref 36.0–46.0)
HEMATOCRIT: 21.7 % — AB (ref 36.0–46.0)
HEMOGLOBIN: 7.1 g/dL — AB (ref 12.0–15.0)
Hemoglobin: 8.7 g/dL — ABNORMAL LOW (ref 12.0–15.0)
MCH: 31.4 pg (ref 26.0–34.0)
MCH: 32.3 pg (ref 26.0–34.0)
MCHC: 32.1 g/dL (ref 30.0–36.0)
MCHC: 32.7 g/dL (ref 30.0–36.0)
MCV: 97.8 fL (ref 78.0–100.0)
MCV: 98.6 fL (ref 78.0–100.0)
PLATELETS: 217 10*3/uL (ref 150–400)
Platelets: 174 10*3/uL (ref 150–400)
RBC: 2.77 MIL/uL — AB (ref 3.87–5.11)
RBC: 2.2 MIL/uL — ABNORMAL LOW (ref 3.87–5.11)
RDW: 16.1 % — ABNORMAL HIGH (ref 11.5–15.5)
RDW: 16.6 % — AB (ref 11.5–15.5)
WBC: 17.3 10*3/uL — ABNORMAL HIGH (ref 4.0–10.5)
WBC: 11.8 10*3/uL — ABNORMAL HIGH (ref 4.0–10.5)

## 2016-03-11 LAB — RENAL FUNCTION PANEL
Albumin: 1.6 g/dL — ABNORMAL LOW (ref 3.5–5.0)
Anion gap: 7 (ref 5–15)
BUN: 10 mg/dL (ref 6–20)
CALCIUM: 8.5 mg/dL — AB (ref 8.9–10.3)
CO2: 30 mmol/L (ref 22–32)
Chloride: 96 mmol/L — ABNORMAL LOW (ref 101–111)
Creatinine, Ser: 4.27 mg/dL — ABNORMAL HIGH (ref 0.44–1.00)
GFR calc Af Amer: 12 mL/min — ABNORMAL LOW (ref >60)
GFR calc non Af Amer: 10 mL/min — ABNORMAL LOW (ref >60)
GLUCOSE: 99 mg/dL (ref 65–99)
Phosphorus: 4.7 mg/dL — ABNORMAL HIGH (ref 2.5–4.6)
Potassium: 3.4 mmol/L — ABNORMAL LOW (ref 3.5–5.1)
SODIUM: 133 mmol/L — AB (ref 135–145)

## 2016-03-11 LAB — CULTURE, BLOOD (ROUTINE X 2)
Culture: NO GROWTH
Culture: NO GROWTH

## 2016-03-11 LAB — GLUCOSE, CAPILLARY
GLUCOSE-CAPILLARY: 67 mg/dL (ref 65–99)
GLUCOSE-CAPILLARY: 120 mg/dL — AB (ref 65–99)
GLUCOSE-CAPILLARY: 82 mg/dL (ref 65–99)
GLUCOSE-CAPILLARY: 100 mg/dL — AB (ref 65–99)
Glucose-Capillary: 87 mg/dL (ref 65–99)
Glucose-Capillary: 68 mg/dL (ref 65–99)
Glucose-Capillary: 90 mg/dL (ref 65–99)
Glucose-Capillary: 66 mg/dL (ref 65–99)

## 2016-03-11 LAB — ALBUMIN: Albumin: 2 g/dL — ABNORMAL LOW (ref 3.5–5.0)

## 2016-03-11 LAB — PROCALCITONIN: Procalcitonin: 27.62 ng/mL

## 2016-03-11 LAB — HEPARIN LEVEL (UNFRACTIONATED): Heparin Unfractionated: 0.31 IU/mL (ref 0.30–0.70)

## 2016-03-11 LAB — MRSA PCR SCREENING: MRSA by PCR: NEGATIVE

## 2016-03-11 LAB — SURGICAL PCR SCREEN
MRSA, PCR: NEGATIVE
Staphylococcus aureus: NEGATIVE

## 2016-03-11 SURGERY — LIGATION ARTERIOVENOUS GORTEX GRAFT
Anesthesia: Monitor Anesthesia Care | Site: Leg Upper | Laterality: Right

## 2016-03-11 MED ORDER — DEXTROSE 50 % IV SOLN
INTRAVENOUS | Status: AC
  Filled 2016-03-11: qty 50

## 2016-03-11 MED ORDER — DEXTROSE 50 % IV SOLN
25.0000 mL | Freq: Once | INTRAVENOUS | Status: AC
  Administered 2016-03-11: 50 mL via INTRAVENOUS

## 2016-03-11 MED ORDER — GLYCOPYRROLATE 0.2 MG/ML IJ SOLN
INTRAMUSCULAR | Status: DC | PRN
  Administered 2016-03-11: 0.2 mg via INTRAVENOUS

## 2016-03-11 MED ORDER — LIDOCAINE-EPINEPHRINE (PF) 1 %-1:200000 IJ SOLN
INTRAMUSCULAR | Status: DC | PRN
  Administered 2016-03-11: 20 mL

## 2016-03-11 MED ORDER — 0.9 % SODIUM CHLORIDE (POUR BTL) OPTIME
TOPICAL | Status: DC | PRN
  Administered 2016-03-11: 1000 mL

## 2016-03-11 MED ORDER — ESMOLOL HCL 100 MG/10ML IV SOLN
INTRAVENOUS | Status: DC | PRN
  Administered 2016-03-11: 20 mg via INTRAVENOUS

## 2016-03-11 MED ORDER — ONDANSETRON HCL 4 MG/2ML IJ SOLN
INTRAMUSCULAR | Status: DC | PRN
  Administered 2016-03-11: 4 mg via INTRAVENOUS

## 2016-03-11 MED ORDER — LIDOCAINE 2% (20 MG/ML) 5 ML SYRINGE
INTRAMUSCULAR | Status: AC
  Filled 2016-03-11: qty 15

## 2016-03-11 MED ORDER — PHENYLEPHRINE HCL 10 MG/ML IJ SOLN
INTRAMUSCULAR | Status: DC | PRN
  Administered 2016-03-11: 80 ug via INTRAVENOUS

## 2016-03-11 MED ORDER — FENTANYL CITRATE (PF) 100 MCG/2ML IJ SOLN
INTRAMUSCULAR | Status: DC | PRN
  Administered 2016-03-11 (×2): 50 ug via INTRAVENOUS

## 2016-03-11 MED ORDER — DARBEPOETIN ALFA 100 MCG/0.5ML IJ SOSY
100.0000 ug | PREFILLED_SYRINGE | INTRAMUSCULAR | Status: DC
  Administered 2016-03-12: 100 ug via INTRAVENOUS
  Filled 2016-03-11 (×2): qty 0.5

## 2016-03-11 MED ORDER — MIDAZOLAM HCL 2 MG/2ML IJ SOLN
INTRAMUSCULAR | Status: AC
  Filled 2016-03-11: qty 2

## 2016-03-11 MED ORDER — PROPOFOL 500 MG/50ML IV EMUL
INTRAVENOUS | Status: DC | PRN
  Administered 2016-03-11: 75 ug/kg/min via INTRAVENOUS

## 2016-03-11 MED ORDER — SODIUM CHLORIDE 0.9 % IV SOLN
INTRAVENOUS | Status: DC
  Administered 2016-03-11 (×2): via INTRAVENOUS

## 2016-03-11 MED ORDER — HEPARIN (PORCINE) IN NACL 100-0.45 UNIT/ML-% IJ SOLN: 1400.0000 [IU]/h | INTRAMUSCULAR | Status: DC

## 2016-03-11 MED ORDER — PROPOFOL 10 MG/ML IV BOLUS
INTRAVENOUS | Status: AC
  Filled 2016-03-11: qty 40

## 2016-03-11 MED ORDER — PROPOFOL 10 MG/ML IV BOLUS
INTRAVENOUS | Status: DC | PRN
  Administered 2016-03-11 (×3): 20 mg via INTRAVENOUS

## 2016-03-11 MED ORDER — LIDOCAINE HCL (CARDIAC) 20 MG/ML IV SOLN
INTRAVENOUS | Status: DC | PRN
  Administered 2016-03-11: 80 mg via INTRAVENOUS

## 2016-03-11 MED ORDER — FENTANYL CITRATE (PF) 100 MCG/2ML IJ SOLN
INTRAMUSCULAR | Status: AC
  Filled 2016-03-11: qty 2

## 2016-03-11 MED ORDER — PRO-STAT SUGAR FREE PO LIQD
30.0000 mL | Freq: Two times a day (BID) | ORAL | Status: DC
  Administered 2016-03-11 – 2016-03-19 (×17): 30 mL via ORAL
  Filled 2016-03-11 (×17): qty 30

## 2016-03-11 MED ORDER — OXYCODONE HCL 5 MG/5ML PO SOLN: 5.0000 mg | Freq: Once | ORAL | Status: DC | PRN

## 2016-03-11 MED ORDER — SODIUM CHLORIDE 0.9 % IV SOLN
INTRAVENOUS | Status: DC | PRN
  Administered 2016-03-11: 14:00:00

## 2016-03-11 MED ORDER — HEPARIN (PORCINE) IN NACL 100-0.45 UNIT/ML-% IJ SOLN
1550.0000 [IU]/h | INTRAMUSCULAR | Status: DC
  Administered 2016-03-11 – 2016-03-12 (×2): 1400 [IU]/h via INTRAVENOUS
  Administered 2016-03-13 – 2016-03-14 (×2): 1450 [IU]/h via INTRAVENOUS
  Administered 2016-03-15 – 2016-03-16 (×2): 1550 [IU]/h via INTRAVENOUS
  Filled 2016-03-11 (×8): qty 250

## 2016-03-11 MED ORDER — HEPARIN SODIUM (PORCINE) 1000 UNIT/ML IJ SOLN
INTRAMUSCULAR | Status: AC
  Filled 2016-03-11: qty 1

## 2016-03-11 MED ORDER — LIDOCAINE HCL (PF) 1 % IJ SOLN
INTRAMUSCULAR | Status: AC
  Filled 2016-03-11: qty 30

## 2016-03-11 MED ORDER — SUCCINYLCHOLINE CHLORIDE 200 MG/10ML IV SOSY
PREFILLED_SYRINGE | INTRAVENOUS | Status: AC
  Filled 2016-03-11: qty 10

## 2016-03-11 MED ORDER — DEXTROSE 50 % IV SOLN
INTRAVENOUS | Status: AC
  Administered 2016-03-11: 50 mL
  Filled 2016-03-11: qty 50

## 2016-03-11 MED ORDER — FENTANYL CITRATE (PF) 100 MCG/2ML IJ SOLN: 25.0000 ug | INTRAMUSCULAR | Status: DC | PRN

## 2016-03-11 MED ORDER — PHENYLEPHRINE 40 MCG/ML (10ML) SYRINGE FOR IV PUSH (FOR BLOOD PRESSURE SUPPORT)
PREFILLED_SYRINGE | INTRAVENOUS | Status: AC
  Filled 2016-03-11: qty 10

## 2016-03-11 MED ORDER — MIDAZOLAM HCL 2 MG/2ML IJ SOLN
INTRAMUSCULAR | Status: DC | PRN
  Administered 2016-03-11: 2 mg via INTRAVENOUS

## 2016-03-11 MED ORDER — HEPARIN SODIUM (PORCINE) 1000 UNIT/ML IJ SOLN
INTRAMUSCULAR | Status: DC | PRN
  Administered 2016-03-11: 3.4 mL

## 2016-03-11 MED ORDER — OXYCODONE HCL 5 MG PO TABS: 5.0000 mg | ORAL_TABLET | Freq: Once | ORAL | Status: DC | PRN

## 2016-03-11 MED ORDER — EPHEDRINE 5 MG/ML INJ
INTRAVENOUS | Status: AC
  Filled 2016-03-11: qty 10

## 2016-03-11 MED ORDER — LIDOCAINE-EPINEPHRINE (PF) 1 %-1:200000 IJ SOLN
INTRAMUSCULAR | Status: AC
  Filled 2016-03-11: qty 30

## 2016-03-11 MED ORDER — NEPRO/CARBSTEADY PO LIQD
237.0000 mL | Freq: Two times a day (BID) | ORAL | Status: DC
  Administered 2016-03-14 – 2016-03-18 (×3): 237 mL via ORAL

## 2016-03-11 MED ORDER — DEXTROSE 50 % IV SOLN
INTRAVENOUS | Status: AC
  Administered 2016-03-11: 50 mL via INTRAVENOUS
  Filled 2016-03-11: qty 50

## 2016-03-11 SURGICAL SUPPLY — 52 items
BIOPATCH RED 1 DISK 7.0 (GAUZE/BANDAGES/DRESSINGS) ×3 IMPLANT
BIOPATCH RED 1IN DISK 7.0MM (GAUZE/BANDAGES/DRESSINGS) ×1
CANISTER SUCTION 2500CC (MISCELLANEOUS) ×4 IMPLANT
CATH PALINDROME RT-P 15FX19CM (CATHETERS) IMPLANT
CATH PALINDROME RT-P 15FX23CM (CATHETERS) ×4 IMPLANT
CATH PALINDROME RT-P 15FX28CM (CATHETERS) IMPLANT
CATH PALINDROME RT-P 15FX55CM (CATHETERS) IMPLANT
CHLORAPREP W/TINT 26ML (MISCELLANEOUS) ×4 IMPLANT
COVER PROBE W GEL 5X96 (DRAPES) ×4 IMPLANT
COVER SURGICAL LIGHT HANDLE (MISCELLANEOUS) ×4 IMPLANT
DERMABOND ADVANCED (GAUZE/BANDAGES/DRESSINGS) ×4
DERMABOND ADVANCED .7 DNX12 (GAUZE/BANDAGES/DRESSINGS) ×4 IMPLANT
DRAPE C-ARM 42X72 X-RAY (DRAPES) ×4 IMPLANT
DRAPE CHEST BREAST 15X10 FENES (DRAPES) ×4 IMPLANT
DRSG COVADERM 4X6 (GAUZE/BANDAGES/DRESSINGS) ×4 IMPLANT
ELECT REM PT RETURN 9FT ADLT (ELECTROSURGICAL) ×4
ELECTRODE REM PT RTRN 9FT ADLT (ELECTROSURGICAL) ×2 IMPLANT
GAUZE SPONGE 4X4 12PLY STRL (GAUZE/BANDAGES/DRESSINGS) ×4 IMPLANT
GLOVE BIO SURGEON STRL SZ 6.5 (GLOVE) ×3 IMPLANT
GLOVE BIO SURGEON STRL SZ7.5 (GLOVE) ×4 IMPLANT
GLOVE BIO SURGEONS STRL SZ 6.5 (GLOVE) ×1
GLOVE BIOGEL PI IND STRL 7.0 (GLOVE) ×4 IMPLANT
GLOVE BIOGEL PI IND STRL 8 (GLOVE) ×2 IMPLANT
GLOVE BIOGEL PI INDICATOR 7.0 (GLOVE) ×4
GLOVE BIOGEL PI INDICATOR 8 (GLOVE) ×2
GOWN STRL REUS W/ TWL LRG LVL3 (GOWN DISPOSABLE) ×6 IMPLANT
GOWN STRL REUS W/TWL LRG LVL3 (GOWN DISPOSABLE) ×6
GUIDEWIRE ANGLED .035X150CM (WIRE) ×4 IMPLANT
KIT BASIN OR (CUSTOM PROCEDURE TRAY) ×4 IMPLANT
KIT ROOM TURNOVER OR (KITS) ×4 IMPLANT
NEEDLE 18GX1X1/2 (RX/OR ONLY) (NEEDLE) ×4 IMPLANT
NEEDLE 22X1 1/2 (OR ONLY) (NEEDLE) ×4 IMPLANT
NEEDLE HYPO 25GX1X1/2 BEV (NEEDLE) ×4 IMPLANT
NS IRRIG 1000ML POUR BTL (IV SOLUTION) ×4 IMPLANT
PACK CV ACCESS (CUSTOM PROCEDURE TRAY) ×4 IMPLANT
PACK SURGICAL SETUP 50X90 (CUSTOM PROCEDURE TRAY) ×4 IMPLANT
PAD ARMBOARD 7.5X6 YLW CONV (MISCELLANEOUS) ×8 IMPLANT
SUT ETHILON 3 0 PS 1 (SUTURE) ×4 IMPLANT
SUT MNCRL AB 4-0 PS2 18 (SUTURE) ×4 IMPLANT
SUT PROLENE 6 0 BV (SUTURE) IMPLANT
SUT SILK 0 TIES 10X30 (SUTURE) ×4 IMPLANT
SUT VIC AB 3-0 SH 27 (SUTURE) ×2
SUT VIC AB 3-0 SH 27X BRD (SUTURE) ×2 IMPLANT
SUT VICRYL 4-0 PS2 18IN ABS (SUTURE) IMPLANT
SYR 20CC LL (SYRINGE) ×8 IMPLANT
SYR 5ML LL (SYRINGE) ×8 IMPLANT
SYR CONTROL 10ML LL (SYRINGE) ×4 IMPLANT
SYRINGE 10CC LL (SYRINGE) ×4 IMPLANT
TOWEL OR 17X24 6PK STRL BLUE (TOWEL DISPOSABLE) ×4 IMPLANT
TOWEL OR 17X26 10 PK STRL BLUE (TOWEL DISPOSABLE) ×4 IMPLANT
UNDERPAD 30X30 (UNDERPADS AND DIAPERS) ×4 IMPLANT
WATER STERILE IRR 1000ML POUR (IV SOLUTION) ×4 IMPLANT

## 2016-03-11 NOTE — Progress Notes (Signed)
[facility] Progress Note   Dialysis Orders: [name] MWF 3h 51min 59.5kg Hep none L fem AVG Po calcitriol 0.1mcg /HD MIrcera 60 mcg q 2wks *was to start 03/05/16" Other op labs hgb 9.8 Ca 9.3 phos 8.7 PTH 1294  Assessment/Plan: 1. PAD/ L foot ischemia - for L thigh AVG ligation today w/ cath insertion by VVS to hopefully improve wound healing on the left heel. 2. Fever / hypotension - ? Heel/ abtx per primary WBC 17 K, BC 1/25 1/2coag neg staph - BC 1/25 and 1/28 no growth; on Maxipime 3. ESRD - MWF - next HD Wed K 3.6 - use added K bath to start 4. Aflutter/ RVR - back in NSR INR 1.6.9 yesterday- pharm following 5. Chest Pain / CAD ho / AOR - card consulted "demand ischemia " 6. BP/Volume - net net UF Sunday 0  UF Monday 500. BP labile- at best has been getting to post HD of 60.5 at the out patient unit with relatively small gains; needs volume down per 1/28 CXR 7. Anemia - hgb 8.7 Start ESA with Wed- was supposed to have started last week for outpt hgb of 9.8;  8. 2HPTH - cont vit D/ phoslo 9. HO Liver transplant sec Hep c- on meds 10. DM Type 2 - per admit  11. COPD 37. Nutrition - alb 2 ? Liver hx/malnutrition/chronic wounds- add prostat and supplements   Myriam Jacobson, PA-C [facility] Beeper [phone] 03/11/2016,8:55 AM  LOS: 5 days   Patient seen and examined.  Agree with PA A/P Patient to have a lt thigh graft ligation bec of a nonhealing wound @ the heel. Will also rx a tunneled catheter.  Otelia Santee, MD [facility]  Subjective:   Requesting pain med (I notified nursing)- understands surgical plans today Objective Vitals:   03/10/16 1635 03/10/16 1727 03/10/16 2203 03/11/16 0619  BP: 121/60 (!) 132/55 (!) 160/56 133/60  Pulse: 92 90 86 87  Resp:  18 18 18   Temp:  98.6 F (37 C) 98.7 F (37.1 C) 98.2 F (36.8 C)  TempSrc:  Oral Oral Oral  SpO2:  97% 99% 99%  Weight:  62.3 kg (137 lb 5.6 oz)    Height:       Physical  Exam General: uncomfortable Heart: RRR Lungs: no rales Abdomen: soft Extremities:left heel dry ulcer with darkened foot Dialysis Access: left thigh AVGG + bruit   Additional Objective Labs: Basic Metabolic Panel:  Recent Labs Lab 03/09/16 1940 03/10/16 0150 03/11/16 0624  NA 134* 134* 135  K 3.4* 3.7 3.6  CL 97* 96* 99*  CO2 28 26 26   GLUCOSE 106* 107* 66  BUN 15 16 7   CREATININE 4.49* 5.21* 3.62*  CALCIUM 8.0* 8.3* 9.1  PHOS 4.5  --   --    Liver Function Tests:  Recent Labs Lab 03/07/16 0425 03/09/16 1940 03/11/16 0624  AST 33  --   --   ALT 16  --   --   ALKPHOS 314*  --   --   BILITOT 3.0*  --   --   PROT 5.8*  --   --   ALBUMIN 1.9* 1.6* 2.0*   No results for input(s): LIPASE, AMYLASE in the last 168 hours. CBC:  Recent Labs Lab 03/08/16 0230 03/09/16 0433 03/09/16 1940 03/10/16 0150 03/11/16 0624  WBC 25.3* 16.7* 18.3* 20.5* 17.3*  HGB 7.6* 7.5* 7.5* 7.6* 8.7*  HCT 23.1* 22.2* 22.6* 23.3* 27.1*  MCV 96.3 95.7 96.2  97.1 97.8  PLT 150 170 144* 172 217   Blood Culture    Component Value Date/Time   SDES BLOOD RIGHT HAND 03/09/2016 1208   SPECREQUEST IN PEDIATRIC BOTTLE 2.5CC 03/09/2016 1208   CULT NO GROWTH 1 DAY 03/09/2016 1208   REPTSTATUS PENDING 03/09/2016 1208    Cardiac Enzymes:  Recent Labs Lab 03/05/16 1529 03/05/16 2122 03/06/16 0248 03/09/16 1024  TROPONINI 1.38* 2.19* 3.48* 2.78*   CBG:  Recent Labs Lab 03/10/16 0743 03/10/16 1117 03/10/16 1731 03/10/16 2033 03/11/16 0749  GLUCAP 86 121* 92 92 67   Iron Studies: No results for input(s): IRON, TIBC, TRANSFERRIN, FERRITIN in the last 72 hours. Lab Results  Component Value Date   INR 1.69 03/10/2016   INR 1.39 03/07/2016   INR 1.81 03/05/2016   Studies/Results: No results found. Medications: . dextrose 10 mL/hr at 03/10/16 0000  . heparin 1,400 Units/hr (03/11/16 [id number])   . dextrose      . calcitRIOL  0.25 mcg Oral Q T,Th,Sa-HD  . calcium acetate  667 mg  Oral TID WC  . ceFEPime (MAXIPIME) IV  1 g Intravenous Q24H  . darbepoetin (ARANESP) injection - DIALYSIS  60 mcg Intravenous Q Thu-HD  . gabapentin  300 mg Oral QPM  . insulin aspart  0-5 Units Subcutaneous QHS  . insulin aspart  0-9 Units Subcutaneous TID WC  . insulin detemir  5 Units Subcutaneous QHS  . levothyroxine  175 mcg Oral QAC breakfast  . multivitamin  1 tablet Oral QHS  . senna-docusate  1 tablet Oral Daily  . sodium chloride flush  3 mL Intravenous Q12H  . tacrolimus  2 mg Oral BID

## 2016-03-11 NOTE — Op Note (Signed)
    NAME: [name]    MRN: [id number] DOB: 08-Oct-1955    DATE OF OPERATION: 03/11/2016  PREOP DIAGNOSIS: End-stage renal disease  POSTOP DIAGNOSIS: Same  PROCEDURE:  1. Ultrasound-guided placement of right IJ tunneled dialysis catheter (23 cm) 2. Ligation of left thigh AV graft  SURGEON: Judeth Cornfield. Scot Dock, MD, FACS  ASSIST: None  ANESTHESIA: Local with sedation  EBL: Minimal  INDICATIONS: [name] is a 61 y.o. female who had a nonhealing wound on her left foot. I was asked to ligate her left thigh AV graft and place a tunneled dialysis catheter.  FINDINGS: Patent right IJ  TECHNIQUE: The patient was taken to the operating room and sedated by anesthesia. The neck and upper chest were prepped and draped in usual sterile fashion. Under ultrasound guidance, after the skin was anesthetized, the right IJ was cannulated and a Glidewire was necessary in order to manipulate the wire down into the right atrium. This track was dilated and the Glidewire exchanged for a J-wire. The tract over the wire was dilated and then the dilator and peel sheath were advanced over the wire and the dilator removed. The 23 cm catheter was advanced over the wire through the peel-away sheath down into the right atrium and the wire and peel-away sheath were removed. The exit site of the catheter was selected and the skin anesthetized between the 2 areas. The cath was brought to the tunnel cath appropriate length and the distal ports were attached. Both ports withdrew easily when flushed with heparin saline and filled with concentrated In. The catheter was secured at its exit site with 3-0 nylon suture. The IJ cannulation site was closely for subcutaneous stitch. Sterile dressing was applied.  Attention was then turned to the left thigh. The left thigh was prepped and draped in the usual sterile fashion. After the skin was anesthetized with function lidocaine graft was dissected free at the distal loop  where it was ligated with 2 2-0 silk ties. Hemostasis was obtained in the wound. This wound was closed with a pair of 3-0 Vicryl and the skin closed with 4-0 Vicryl. The Liquiband was applied. Patient tolerated the procedure well and transferred to the recovery room in stable condition. All needle and sponge counts were correct.  [name], MD, FACS [facility]  DATE OF DICTATION:   03/11/2016

## 2016-03-11 NOTE — Progress Notes (Signed)
ANTICOAGULATION CONSULT NOTE - Follow Up Consult  Pharmacy Consult for heparin Indication: mechanical AVR, atrial flutter  Allergies  Allergen Reactions  . Acetaminophen Other (See Comments)    Liver transplant recipient   . Codeine Itching  . Mirtazapine Other (See Comments)    hallucination  . Morphine Itching and Other (See Comments)    hallucinate    Patient Measurements: Height: 5\' 4"  (162.6 cm) Weight: 138 lb 7.2 oz (62.8 kg) IBW/kg (Calculated) : 54.7 Heparin Dosing Weight: 59 kg  Vital Signs: Temp: 97.8 F (36.6 C) (01/30 1550) Temp Source: Oral (01/30 1000) BP: 114/55 (01/30 1756) Pulse Rate: 90 (01/30 1756)  Labs:  Recent Labs  03/09/16 0433 03/09/16 1024 03/09/16 1940 03/10/16 0150 03/11/16 0624  HGB 7.5*  --  7.5* 7.6* 8.7*  HCT 22.2*  --  22.6* 23.3* 27.1*  PLT 170  --  144* 172 217  LABPROT  --   --   --  20.1*  --   INR  --   --   --  1.69  --   HEPARINUNFRC 0.33  --   --  0.36 0.31  CREATININE 9.12*  --  4.49* 5.21* 3.62*  TROPONINI  --  2.78*  --   --   --     Estimated Creatinine Clearance: 14.1 mL/min (by C-G formula based on SCr of 3.62 mg/dL (H)).   Medications:  Scheduled:  . calcitRIOL  0.25 mcg Oral Q T,Th,Sa-HD  . calcium acetate  667 mg Oral TID WC  . ceFEPime (MAXIPIME) IV  1 g Intravenous Q24H  . [START ON 03/12/2016] darbepoetin (ARANESP) injection - DIALYSIS  100 mcg Intravenous Q Wed-HD  . dextrose      . feeding supplement (NEPRO CARB STEADY)  237 mL Oral BID BM  . feeding supplement (PRO-STAT SUGAR FREE 64)  30 mL Oral BID  . gabapentin  300 mg Oral QPM  . insulin aspart  0-5 Units Subcutaneous QHS  . insulin aspart  0-9 Units Subcutaneous TID WC  . insulin detemir  5 Units Subcutaneous QHS  . levothyroxine  175 mcg Oral QAC breakfast  . multivitamin  1 tablet Oral QHS  . senna-docusate  1 tablet Oral Daily  . sodium chloride flush  3 mL Intravenous Q12H  . tacrolimus  2 mg Oral BID   Infusions:  . sodium chloride  10 mL/hr at 03/11/16 1324  . dextrose 10 mL/hr at 03/10/16 0000  . heparin      Assessment: 61 yo F on heparin bridge for atrial flutter and hx mechanical AVR.  Pt was on Coumadin PTA which is being held for a inpatient procedure.  Heparin remains therapeutic on 1400 units/hr.  Heparin held on-call at 1145 am. Now heparin to be resumed post op per VVS orders. D/w RN  Gwenlyn Perking.   Goal of Therapy:  Heparin level 0.3-0.7 units/ml Monitor platelets by anticoagulation protocol: Yes   Plan:  resume heparin at 1400 units/hr Continue daily heparin level and CBC Follow-up plans for restarting oral anticoagulation.  Eudelia Bunch, Pharm.D. [id number] 03/11/2016 6:21 PM

## 2016-03-11 NOTE — Progress Notes (Signed)
ANTICOAGULATION CONSULT NOTE - Follow Up Consult  Pharmacy Consult for heparin Indication: mechanical AVR, atrial flutter  Allergies  Allergen Reactions  . Acetaminophen Other (See Comments)    Liver transplant recipient   . Codeine Itching  . Mirtazapine Other (See Comments)    hallucination  . Morphine Itching and Other (See Comments)    hallucinate    Patient Measurements: Height: 5\' 4"  (162.6 cm) Weight: 137 lb 5.6 oz (62.3 kg) IBW/kg (Calculated) : 54.7 Heparin Dosing Weight: 59 kg  Vital Signs: Temp: 98.6 F (37 C) (01/30 1000) Temp Source: Oral (01/30 1000) BP: 141/66 (01/30 1000) Pulse Rate: 87 (01/30 1000)  Labs:  Recent Labs  03/09/16 0433 03/09/16 1024 03/09/16 1940 03/10/16 0150 03/11/16 0624  HGB 7.5*  --  7.5* 7.6* 8.7*  HCT 22.2*  --  22.6* 23.3* 27.1*  PLT 170  --  144* 172 217  LABPROT  --   --   --  20.1*  --   INR  --   --   --  1.69  --   HEPARINUNFRC 0.33  --   --  0.36 0.31  CREATININE 9.12*  --  4.49* 5.21* 3.62*  TROPONINI  --  2.78*  --   --   --     Estimated Creatinine Clearance: 14.1 mL/min (by C-G formula based on SCr of 3.62 mg/dL (H)).   Medications:  Scheduled:  . [MAR Hold] calcitRIOL  0.25 mcg Oral Q T,Th,Sa-HD  . [MAR Hold] calcium acetate  667 mg Oral TID WC  . [MAR Hold] ceFEPime (MAXIPIME) IV  1 g Intravenous Q24H  . [MAR Hold] darbepoetin (ARANESP) injection - DIALYSIS  100 mcg Intravenous Q Wed-HD  . [MAR Hold] feeding supplement (NEPRO CARB STEADY)  237 mL Oral BID BM  . [MAR Hold] feeding supplement (PRO-STAT SUGAR FREE 64)  30 mL Oral BID  . [MAR Hold] gabapentin  300 mg Oral QPM  . [MAR Hold] insulin aspart  0-5 Units Subcutaneous QHS  . [MAR Hold] insulin aspart  0-9 Units Subcutaneous TID WC  . [MAR Hold] insulin detemir  5 Units Subcutaneous QHS  . [MAR Hold] levothyroxine  175 mcg Oral QAC breakfast  . [MAR Hold] multivitamin  1 tablet Oral QHS  . [MAR Hold] senna-docusate  1 tablet Oral Daily  . [MAR  Hold] sodium chloride flush  3 mL Intravenous Q12H  . [MAR Hold] tacrolimus  2 mg Oral BID   Infusions:  . dextrose 10 mL/hr at 03/10/16 0000  . heparin 1,400 Units/hr (03/11/16 [id number])    Assessment: 61 yo F on heparin bridge for atrial flutter and hx mechanical AVR.  Pt was on Coumadin PTA which is being held for a inpatient procedure.  Heparin remains therapeutic on 1400 units/hr.  Heparin to be held on-call to procedure today.  Goal of Therapy:  Heparin level 0.3-0.7 units/ml Monitor platelets by anticoagulation protocol: Yes   Plan:  Continue heparin at 1400 units/hr Continue daily heparin level and CBC Follow-up post procedure for plans on restarting heparin and/or oral anticoagulation.  [employer], Pharm.D., BCPS Clinical Pharmacist Pager [phone] 03/11/2016 1:18 PM

## 2016-03-11 NOTE — Care Management Important Message (Signed)
Important Message  Patient Details  Name: [name] MRN: [id number] Date of Birth: Mar 05, 1955   Medicare Important Message Given:  Yes    Orbie Pyo 03/11/2016, 12:07 PM

## 2016-03-11 NOTE — Anesthesia Preprocedure Evaluation (Signed)
Anesthesia Evaluation  Patient identified by MRN, date of birth, ID band Patient awake    Reviewed: Allergy & Precautions, NPO status , Patient's Chart, lab work & pertinent test results  History of Anesthesia Complications Negative for: history of anesthetic complications  Airway Mallampati: III  TM Distance: >3 FB Neck ROM: Full    Dental  (+) Edentulous Upper, Edentulous Lower   Pulmonary asthma , sleep apnea , COPD, Current Smoker,    breath sounds clear to auscultation- rhonchi       Cardiovascular hypertension, + CAD, + CABG and +CHF  + Valvular Problems/Murmurs (s/p mechanical AVR. EF 50%.)  Rhythm:Regular Rate:Normal     Neuro/Psych  Headaches, Anxiety Depression    GI/Hepatic GERD  ,(+) Hepatitis -, C  Endo/Other  diabetesHypothyroidism   Renal/GU Dialysis and ESRFRenal disease     Musculoskeletal  (+) Arthritis ,   Abdominal   Peds  Hematology  (+) anemia ,   Anesthesia Other Findings   Reproductive/Obstetrics                             Anesthesia Physical Anesthesia Plan  ASA: III  Anesthesia Plan: MAC   Post-op Pain Management:    Induction: Intravenous  Airway Management Planned: Natural Airway, Nasal Cannula and Simple Face Mask  Additional Equipment: None  Intra-op Plan:   Post-operative Plan:   Informed Consent: I have reviewed the patients History and Physical, chart, labs and discussed the procedure including the risks, benefits and alternatives for the proposed anesthesia with the patient or authorized representative who has indicated his/her understanding and acceptance.   Dental advisory given  Plan Discussed with: CRNA and Surgeon  Anesthesia Plan Comments:         Anesthesia Quick Evaluation

## 2016-03-11 NOTE — Progress Notes (Signed)
Heparin stopped at 1145 this morning.

## 2016-03-11 NOTE — Progress Notes (Signed)
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 Presents to ED on 1/24 from HD with A.Flutter HR of 180. Patient reported left foot pain in which she is being followed by vascular. Upon arrival to ED patient reports chest pain and had pos troponins as well as a small left pleural effusion on chest xray. Cardiology was consulted and believed that elevated trops were due to ischemic demand in setting of a possible infection. During stay patient has been febrile, lactic acid 1.5, and pending blood cultures. Patient was admitted to step-down under [facility]. On 1/26 patient became increasingly lethargic and hypotensive. [facility] M was consulted and patient transferred to ICU. Patient did not wish to be intubated on BiPAP. She was seen by vascular surgery plan for removal of left thigh graft and place new thigh graft and catheter. Initially patient and family refused  the procedure.    Subjective   This morning patient denies pain, plan for ligation of left thigh AV graft.   Assessment/Plan:      1. Chest pain/CAD- patient has significant history of CAD, troponin was elevated. Cardiology was consulted, initially nuclear Myoview was planned but later cartilage canceled the procedure. At this time no additional workup is recommended. Continue when necessary morphine for pain 2. Coagulase negative staph bacteremia-blood cultures 1 out of 2 bottles grew quickly is negative staph, continue vancomycin. Repeat blood cultures ordered. Will follow the results 3. Healthcare associated pneumonia- patient diagnosed with healthcare associated pneumonia, started on vancomycin and cefepime. Will follow repeat blood culture results 4. Leukocytosis- she has persistent leukocytosis, today WBC 20.5. She has been afebrile, blood  pressure stable. Continue antibiotics for now. 5. 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 Continue Aranesp 6. Diabetes mellitus- blood glucose is stable, continue sliding scale insulin with NovoLog. 7. Chronic left lower extremity pain-patient was seen by vascular surgery, initially there was planned to remove the left thigh graft and replaced with high new left thigh graft and catheter. Patient and family earlier had refused the procedure, yesterday morning  patient told palliative  care that she wants to have the procedure.Vascular surgery was re consulted, plan to undergo ligation of left AV graft today.. 8. COPD- stable. Continue duo nebs when necessary 9. ESRD on hemodialysis- followed by nephrology, on hemodialysis Tuesday Thursday and Saturday. 10. Chronic immunosuppression in the setting of liver transplantation/Hepatitis C- continue Prograf 11. Hypothyroidism-TSH 4.836, continue Synthroid.    DVT prophylaxis: SCDs  Code Status: DO NOT RESUSCITATE  Family Communication: No family present at bedside   Disposition Plan: Pending resolution of multiple medical problems.   Consultants:  PCCM  Vascular surgery  Cardiology  Palliative care  Procedures:  None  Continuous infusions . sodium chloride 10 mL/hr at 03/11/16 1324  . dextrose 10 mL/hr at 03/10/16 0000  . heparin 1,400 Units/hr (03/11/16 [id number])      Antibiotics:   Anti-infectives    Start     Dose/Rate Route Frequency Ordered Stop   03/10/16 1400  vancomycin (VANCOCIN) IVPB 750 mg/150 ml premix     750 mg 150 mL/hr over 60 Minutes Intravenous Every Dialysis 03/10/16 1335 03/10/16 1631   03/09/16 1200  vancomycin (VANCOCIN) IVPB 750 mg/150 ml premix  Status:  Discontinued  750 mg 150 mL/hr over 60 Minutes Intravenous  Once 03/08/16 1452 03/08/16 1453   03/09/16 1200  vancomycin (VANCOCIN) IVPB 750 mg/150 ml premix  Status:  Discontinued     750 mg 150 mL/hr over 60 Minutes  Intravenous Every Dialysis 03/08/16 1453 03/10/16 1335   03/08/16 2200  [MAR Hold]  ceFEPIme (MAXIPIME) 1 g in dextrose 5 % 50 mL IVPB     (MAR Hold since 03/11/16 1306)   1 g 100 mL/hr over 30 Minutes Intravenous Every 24 hours 03/07/16 0743     03/08/16 1200  vancomycin (VANCOCIN) IVPB 750 mg/150 ml premix  Status:  Discontinued     750 mg 150 mL/hr over 60 Minutes Intravenous Every T-Th-Sa (Hemodialysis) 03/07/16 0743 03/08/16 1452   03/07/16 0745  vancomycin (VANCOCIN) 1,500 mg in sodium chloride 0.9 % 500 mL IVPB     1,500 mg 250 mL/hr over 120 Minutes Intravenous  Once 03/07/16 0743 03/07/16 1244   03/07/16 0745  ceFEPIme (MAXIPIME) 2 g in dextrose 5 % 50 mL IVPB     2 g 100 mL/hr over 30 Minutes Intravenous  Once 03/07/16 0743 03/07/16 0944       Objective   Vitals:   03/11/16 0619 03/11/16 1000 03/11/16 1205 03/11/16 1550  BP: 133/60 (!) 141/66  134/66  Pulse: 87 87  92  Resp: 18 18  16   Temp: 98.2 F (36.8 C) 98.6 F (37 C)  97.8 F (36.6 C)  TempSrc: Oral Oral    SpO2: 99% 94%  100%  Weight:   62.8 kg (138 lb 7.2 oz)   Height:        Intake/Output Summary (Last 24 hours) at 03/11/16 1611 Last data filed at 03/11/16 1555  Gross per 24 hour  Intake           780.17 ml  Output              550 ml  Net           230.17 ml   Filed Weights   03/10/16 1632 03/10/16 1727 03/11/16 1205  Weight: 67.5 kg (148 lb 13 oz) 62.3 kg (137 lb 5.6 oz) 62.8 kg (138 lb 7.2 oz)     Physical Examination:  General exam: Appears calm and comfortable. Respiratory system: Clear to auscultation. Respiratory effort normal. Cardiovascular system:  RRR. No  murmurs, rubs, gallops. No pedal edema. GI system: Abdomen is nondistended, soft and nontender. No organomegaly.  Central nervous system. No focal neurological deficits. 5 x 5 power in all extremities. Skin: No rashes, lesions or ulcers. Psychiatry: Alert, oriented x 3.Judgement and insight appear normal. Affect  normal.    Data Reviewed: I have personally reviewed following labs and imaging studies  CBG:  Recent Labs Lab 03/11/16 0749 03/11/16 0905 03/11/16 1142 03/11/16 1350 03/11/16 1424  GLUCAP 67 120* 87 68 82    CBC:  Recent Labs Lab 03/08/16 0230 03/09/16 0433 03/09/16 1940 03/10/16 0150 03/11/16 0624  WBC 25.3* 16.7* 18.3* 20.5* 17.3*  HGB 7.6* 7.5* 7.5* 7.6* 8.7*  HCT 23.1* 22.2* 22.6* 23.3* 27.1*  MCV 96.3 95.7 96.2 97.1 97.8  PLT 150 170 144* 172 [id number]    Basic Metabolic Panel:  Recent Labs Lab 03/08/16 0230 03/09/16 0433 03/09/16 1940 03/10/16 0150 03/11/16 0624  NA 134* 133* 134* 134* 135  K 4.2 4.3 3.4* 3.7 3.6  CL 97* 93* 97* 96* 99*  CO2 26 23 28 26 26   GLUCOSE 107* 115* 106* 107*  66  BUN 40* 47* 15 16 7   CREATININE 7.94* 9.12* 4.49* 5.21* 3.62*  CALCIUM 8.0* 7.7* 8.0* 8.3* 9.1  PHOS  --   --  4.5  --   --     Recent Results (from the past 240 hour(s))  MRSA PCR Screening     Status: None   Collection Time: 03/05/16  6:37 PM  Result Value Ref Range Status   MRSA by PCR NEGATIVE NEGATIVE Final    Comment:        The GeneXpert MRSA Assay (FDA approved for NASAL specimens only), is one component of a comprehensive MRSA colonization surveillance program. It is not intended to diagnose MRSA infection nor to guide or monitor treatment for MRSA infections.   Culture, blood (routine x 2)     Status: None   Collection Time: 03/06/16  5:48 AM  Result Value Ref Range Status   Specimen Description BLOOD LEFT HAND  Final   Special Requests BOTTLES DRAWN AEROBIC AND ANAEROBIC 10CC EA  Final   Culture NO GROWTH 5 DAYS  Final   Report Status 03/11/2016 FINAL  Final  Culture, blood (routine x 2)     Status: None   Collection Time: 03/06/16  5:54 AM  Result Value Ref Range Status   Specimen Description BLOOD RIGHT HAND  Final   Special Requests BOTTLES DRAWN AEROBIC AND ANAEROBIC 10CC EA  Final   Culture NO GROWTH 5 DAYS  Final   Report Status  03/11/2016 FINAL  Final  Culture, blood (Routine X 2) w Reflex to ID Panel     Status: None (Preliminary result)   Collection Time: 03/07/16  8:52 AM  Result Value Ref Range Status   Specimen Description BLOOD LEFT HAND  Final   Special Requests BOTTLES DRAWN AEROBIC ONLY 5CC  Final   Culture NO GROWTH 4 DAYS  Final   Report Status PENDING  Incomplete  Culture, blood (Routine X 2) w Reflex to ID Panel     Status: Abnormal   Collection Time: 03/07/16  8:52 AM  Result Value Ref Range Status   Specimen Description BLOOD RIGHT HAND  Final   Special Requests IN PEDIATRIC BOTTLE 3CC  Final   Culture  Setup Time   Final    GRAM POSITIVE COCCI IN CLUSTERS IN PEDIATRIC BOTTLE CRITICAL RESULT CALLED TO, READ BACK BY AND VERIFIED WITH: M MACCIA,PHARMD AT [id number] 03/08/16 BY L BENFIELD    Culture (A)  Final    STAPHYLOCOCCUS SPECIES (COAGULASE NEGATIVE) THE SIGNIFICANCE OF ISOLATING THIS ORGANISM FROM A SINGLE SET OF BLOOD CULTURES WHEN MULTIPLE SETS ARE DRAWN IS UNCERTAIN. PLEASE NOTIFY THE MICROBIOLOGY DEPARTMENT WITHIN ONE WEEK IF SPECIATION AND SENSITIVITIES ARE REQUIRED.    Report Status 03/10/2016 FINAL  Final  Blood Culture ID Panel (Reflexed)     Status: Abnormal   Collection Time: 03/07/16  8:52 AM  Result Value Ref Range Status   Enterococcus species NOT DETECTED NOT DETECTED Final   Listeria monocytogenes NOT DETECTED NOT DETECTED Final   Staphylococcus species DETECTED (A) NOT DETECTED Final    Comment: CRITICAL RESULT CALLED TO, READ BACK BY AND VERIFIED WITH: M MACCIA,PHARMD AT [id number] 03/08/16 BY L BENFIELD    Staphylococcus aureus NOT DETECTED NOT DETECTED Final   Methicillin resistance NOT DETECTED NOT DETECTED Final   Streptococcus species NOT DETECTED NOT DETECTED Final   Streptococcus agalactiae NOT DETECTED NOT DETECTED Final   Streptococcus pneumoniae NOT DETECTED NOT DETECTED Final   Streptococcus pyogenes NOT DETECTED  NOT DETECTED Final   Acinetobacter baumannii NOT DETECTED  NOT DETECTED Final   Enterobacteriaceae species NOT DETECTED NOT DETECTED Final   Enterobacter cloacae complex NOT DETECTED NOT DETECTED Final   Escherichia coli NOT DETECTED NOT DETECTED Final   Klebsiella oxytoca NOT DETECTED NOT DETECTED Final   Klebsiella pneumoniae NOT DETECTED NOT DETECTED Final   Proteus species NOT DETECTED NOT DETECTED Final   Serratia marcescens NOT DETECTED NOT DETECTED Final   Haemophilus influenzae NOT DETECTED NOT DETECTED Final   Neisseria meningitidis NOT DETECTED NOT DETECTED Final   Pseudomonas aeruginosa NOT DETECTED NOT DETECTED Final   Candida albicans NOT DETECTED NOT DETECTED Final   Candida glabrata NOT DETECTED NOT DETECTED Final   Candida krusei NOT DETECTED NOT DETECTED Final   Candida parapsilosis NOT DETECTED NOT DETECTED Final   Candida tropicalis NOT DETECTED NOT DETECTED Final  Respiratory Panel by PCR     Status: None   Collection Time: 03/08/16  9:45 AM  Result Value Ref Range Status   Adenovirus NOT DETECTED NOT DETECTED Final   Coronavirus 229E NOT DETECTED NOT DETECTED Final   Coronavirus HKU1 NOT DETECTED NOT DETECTED Final   Coronavirus NL63 NOT DETECTED NOT DETECTED Final   Coronavirus OC43 NOT DETECTED NOT DETECTED Final   Metapneumovirus NOT DETECTED NOT DETECTED Final   Rhinovirus / Enterovirus NOT DETECTED NOT DETECTED Final   Influenza A NOT DETECTED NOT DETECTED Final   Influenza B NOT DETECTED NOT DETECTED Final   Parainfluenza Virus 1 NOT DETECTED NOT DETECTED Final   Parainfluenza Virus 2 NOT DETECTED NOT DETECTED Final   Parainfluenza Virus 3 NOT DETECTED NOT DETECTED Final   Parainfluenza Virus 4 NOT DETECTED NOT DETECTED Final   Respiratory Syncytial Virus NOT DETECTED NOT DETECTED Final   Bordetella pertussis NOT DETECTED NOT DETECTED Final   Chlamydophila pneumoniae NOT DETECTED NOT DETECTED Final   Mycoplasma pneumoniae NOT DETECTED NOT DETECTED Final  Culture, blood (routine x 2)     Status: None (Preliminary  result)   Collection Time: 03/09/16 11:59 AM  Result Value Ref Range Status   Specimen Description BLOOD HEMODIALYSIS CATHETER  Final   Special Requests BOTTLES DRAWN AEROBIC AND ANAEROBIC 10CC EAC  Final   Culture NO GROWTH 2 DAYS  Final   Report Status PENDING  Incomplete  Culture, blood (routine x 2)     Status: None (Preliminary result)   Collection Time: 03/09/16 12:08 PM  Result Value Ref Range Status   Specimen Description BLOOD RIGHT HAND  Final   Special Requests IN PEDIATRIC BOTTLE 2.5CC  Final   Culture NO GROWTH 2 DAYS  Final   Report Status PENDING  Incomplete  Surgical pcr screen     Status: None   Collection Time: 03/10/16 11:11 PM  Result Value Ref Range Status   MRSA, PCR NEGATIVE NEGATIVE Final   Staphylococcus aureus NEGATIVE NEGATIVE Final    Comment:        The Xpert SA Assay (FDA approved for NASAL specimens in patients over 60 years of age), is one component of a comprehensive surveillance program.  Test performance has been validated by [facility] for patients greater than or equal to 81 year old. It is not intended to diagnose infection nor to guide or monitor treatment.   MRSA PCR Screening     Status: None   Collection Time: 03/11/16  1:00 PM  Result Value Ref Range Status   MRSA by PCR NEGATIVE NEGATIVE Final    Comment:  The GeneXpert MRSA Assay (FDA approved for NASAL specimens only), is one component of a comprehensive MRSA colonization surveillance program. It is not intended to diagnose MRSA infection nor to guide or monitor treatment for MRSA infections.      Liver Function Tests:  Recent Labs Lab 03/07/16 0425 03/09/16 1940 03/11/16 0624  AST 33  --   --   ALT 16  --   --   ALKPHOS 314*  --   --   BILITOT 3.0*  --   --   PROT 5.8*  --   --   ALBUMIN 1.9* 1.6* 2.0*   No results for input(s): LIPASE, AMYLASE in the last 168 hours. No results for input(s): AMMONIA in the last 168 hours.  Cardiac  Enzymes:  Recent Labs Lab 03/05/16 1529 03/05/16 2122 03/06/16 0248 03/09/16 1024  TROPONINI 1.38* 2.19* 3.48* 2.78*   BNP (last 3 results)  Recent Labs  04/21/15 0753 07/09/15 1733  BNP 2,040.6* 1,495.0*    ProBNP (last 3 results) No results for input(s): PROBNP in the last 8760 hours.    Studies: Dg Fluoro Guide Cv Line-no Report  Result Date: 03/11/2016 Fluoroscopy was utilized by the requesting physician.  No radiographic interpretation.    Scheduled Meds: . [MAR Hold] calcitRIOL  0.25 mcg Oral Q T,Th,Sa-HD  . [MAR Hold] calcium acetate  667 mg Oral TID WC  . [MAR Hold] ceFEPime (MAXIPIME) IV  1 g Intravenous Q24H  . [MAR Hold] darbepoetin (ARANESP) injection - DIALYSIS  100 mcg Intravenous Q Wed-HD  . dextrose      . [MAR Hold] feeding supplement (NEPRO CARB STEADY)  237 mL Oral BID BM  . [MAR Hold] feeding supplement (PRO-STAT SUGAR FREE 64)  30 mL Oral BID  . [MAR Hold] gabapentin  300 mg Oral QPM  . [MAR Hold] insulin aspart  0-5 Units Subcutaneous QHS  . [MAR Hold] insulin aspart  0-9 Units Subcutaneous TID WC  . [MAR Hold] insulin detemir  5 Units Subcutaneous QHS  . [MAR Hold] levothyroxine  175 mcg Oral QAC breakfast  . [MAR Hold] multivitamin  1 tablet Oral QHS  . [MAR Hold] senna-docusate  1 tablet Oral Daily  . [MAR Hold] sodium chloride flush  3 mL Intravenous Q12H  . [MAR Hold] tacrolimus  2 mg Oral BID      Time spent: 25 minutes  [facility] Pager [phone]. If 7PM-7AM, please contact night-coverage at [email or web address], Office  [phone]  password [email or web address] 03/11/2016, 4:11 PM  LOS: 5 days

## 2016-03-11 NOTE — Interval H&P Note (Signed)
History and Physical Interval Note:  03/11/2016 2:01 PM  [name]  has presented today for surgery, with the diagnosis of End Stage Renal Disease N18.6  The various methods of treatment have been discussed with the patient and family. After consideration of risks, benefits and other options for treatment, the patient has consented to  Procedure(s): LIGATION THIGH ARTERIOVENOUS GORTEX GRAFT (Left) INSERTION OF DIALYSIS CATHETER (N/A) as a surgical intervention .  The patient's history has been reviewed, patient examined, no change in status, stable for surgery.  I have reviewed the patient's chart and labs.  Questions were answered to the patient's satisfaction.     [name]

## 2016-03-11 NOTE — Anesthesia Postprocedure Evaluation (Signed)
Anesthesia Post Note  Patient: DESHUNDA DUDECK  Procedure(s) Performed: Procedure(s) (LRB): LIGATION THIGH ARTERIOVENOUS GORTEX GRAFT (Left) INSERTION OF DIALYSIS CATHETER (Right)  Patient location during evaluation: PACU Anesthesia Type: General Level of consciousness: awake and alert Pain management: pain level controlled Vital Signs Assessment: post-procedure vital signs reviewed and stable Respiratory status: spontaneous breathing, nonlabored ventilation, respiratory function stable and patient connected to nasal cannula oxygen Cardiovascular status: blood pressure returned to baseline and stable Postop Assessment: no signs of nausea or vomiting Anesthetic complications: no       Last Vitals:  Vitals:   03/11/16 1000 03/11/16 1550  BP: (!) 141/66 134/66  Pulse: 87 92  Resp: 18 16  Temp: 37 C 36.6 C    Last Pain:  Vitals:   03/11/16 1550  TempSrc:   PainSc: [place] DAVID

## 2016-03-11 NOTE — Transfer of Care (Signed)
Immediate Anesthesia Transfer of Care Note  Patient: [name]  Procedure(s) Performed: Procedure(s): LIGATION THIGH ARTERIOVENOUS GORTEX GRAFT (Left) INSERTION OF DIALYSIS CATHETER (Right)  Patient Location: PACU  Anesthesia Type:MAC  Level of Consciousness: awake and alert   Airway & Oxygen Therapy: Patient Spontanous Breathing and Patient connected to face mask oxygen  Post-op Assessment: Report given to RN and Post -op Vital signs reviewed and stable  Post vital signs: Reviewed and stable  Last Vitals:  Vitals:   03/11/16 1000 03/11/16 1550  BP: (!) 141/66 134/66  Pulse: 87 92  Resp: 18 16  Temp: 37 C 36.6 C    Last Pain:  Vitals:   03/11/16 1550  TempSrc:   PainSc: Asleep      Patients Stated Pain Goal: 3 (53/29/92 4268)  Complications: No apparent anesthesia complications

## 2016-03-11 NOTE — Evaluation (Signed)
Physical Therapy Evaluation Patient Details Name: [name] MRN: [id number] DOB: May 28, 1955 Today's Date: 03/11/2016   History of Present Illness   [name] is a 61 y.o. female with medical history significant end-stage renal disease on Tuesday Thursday Saturday dialysis, CAD with LIMA, AVR, liver transplant, hep C, COPD, diabetes, hypertension, SVT presents to the emergency Department chief complaint persistent/worsening left foot/leg pain and palpitations with chest pain. Initial evaluation reveals elevated troponin, leukocytosis and a flutter HR 180  Clinical Impression  Pt admitted with/for complications stated above.  Pt currently limited functionally due to the problems listed below.  (see problems list.)  Pt will benefit from PT to maximize function and safety to be able to get home safely with available assist of her daughter..     Follow Up Recommendations Home health PT;Supervision for mobility/OOB    Equipment Recommendations  None recommended by PT    Recommendations for Other Services       Precautions / Restrictions Precautions Precautions: Fall      Mobility  Bed Mobility Overal bed mobility: Needs Assistance Bed Mobility: Rolling;Sidelying to Sit;Sit to Supine Rolling: Min assist Sidelying to sit: Min assist   Sit to supine: Mod assist   General bed mobility comments: assisting with LE due to pt lethargic and not wanting to move them.  Transfers Overall transfer level: Needs assistance Equipment used: Rolling walker (2 wheeled) Transfers: Sit to/from Stand Sit to Stand: Mod assist         General transfer comment: pt needed assist to come forward and boost up.  Pt clearly in pain on L foot.  She attempted w/shift and stepping, but unable to bear more weight on the L LE.  Ambulation/Gait                Stairs            Wheelchair Mobility    Modified Rankin (Stroke Patients Only)       Balance Overall balance  assessment: Needs assistance Sitting-balance support: No upper extremity supported Sitting balance-Leahy Scale: Good     Standing balance support: Bilateral upper extremity supported Standing balance-Leahy Scale: Poor Standing balance comment: reliant on the RW                             Pertinent Vitals/Pain Pain Assessment: Faces Faces Pain Scale: Hurts even more Pain Location:  (L foot up leg) Pain Descriptors / Indicators: Aching;Sore;Moaning;Grimacing Pain Intervention(s): Monitored during session;Repositioned    Home Living Family/patient expects to be discharged to:: Private residence Living Arrangements: Children (daughter lives with her) Available Help at Discharge: Family;Available PRN/intermittently (daughter works 3rd shift.) Type of Home: Mobile home Home Access: Stairs to enter Entrance Stairs-Rails: Right;Left;Can reach both Entrance Stairs-Number of Steps: 5 Home Layout: One level Home Equipment: Walker - 2 wheels;Cane - single point;Walker - 4 wheels;Bedside commode      Prior Function Level of Independence: Needs assistance   Gait / Transfers Assistance Needed: states she uses rollator     Comments: pt states her daughter has had to help her much more lately due to L LE pain.     Hand Dominance   Dominant Hand: Right    Extremity/Trunk Assessment   Upper Extremity Assessment Upper Extremity Assessment: Defer to OT evaluation    Lower Extremity Assessment Lower Extremity Assessment: Generalized weakness       Communication   Communication: No difficulties  Cognition Arousal/Alertness:  Lethargic Behavior During Therapy: Flat affect;WFL for tasks assessed/performed Overall Cognitive Status: Difficult to assess                      General Comments      Exercises     Assessment/Plan    PT Assessment Patient needs continued PT services  PT Problem List Decreased strength;Decreased activity tolerance;Decreased  balance;Decreased mobility;Decreased knowledge of use of DME;Pain          PT Treatment Interventions Gait training;Functional mobility training;DME instruction;Therapeutic activities;Balance training;Cognitive remediation    PT Goals (Current goals can be found in the Care Plan section)  Acute Rehab PT Goals Patient Stated Goal: be able to walk better and not have to ask so much of my daughter PT Goal Formulation: With patient Time For Goal Achievement: 03/25/16 Potential to Achieve Goals: Good    Frequency Min 3X/week   Barriers to discharge        Co-evaluation               End of Session   Activity Tolerance: Patient tolerated treatment well;Patient limited by pain Patient left: in bed;with call bell/phone within reach;with bed alarm set Nurse Communication: Mobility status         Time: 1035-1105 PT Time Calculation (min) (ACUTE ONLY): 30 min   Charges:   PT Evaluation $PT Eval Moderate Complexity: 1 Procedure PT Treatments $Therapeutic Activity: 8-22 mins   PT G CodesTessie [name] [name] 03/11/2016, 1:22 PM 03/11/2016  [name], PT [phone]  (pager)

## 2016-03-11 NOTE — H&P (View-Only) (Signed)
[facility]  Subjective  - left heel hurts   Objective (!) 116/57 87 98.4 F (36.9 C) (Oral) 15 98%  Intake/Output Summary (Last 24 hours) at 03/10/16 1215 Last data filed at 03/10/16 1100  Gross per 24 hour  Intake              887 ml  Output                0 ml  Net              887 ml   Pt still complains of pain in her left heel worse today than is was when I saw her several days ago Left heel still no fluctuance or erythema, 2 cm dry black eschcar  Assessment/Planning: Appears to be improving from septic episode over the weekend.  Still has pain in left heel.  No change in appearance no obvious infection.  Have discussed with pt and daughter that even with ligation of graft relief of pain may not be instantaneous and heel wound may not heal despite this as this is a notoriously difficult place to heal and is usually decubitus in nature due to inactivity.  In light of her overall debilitated condition will ligate graft left leg tomorrow and place catheter.  If her overall condition improves will place right thigh graft a later interval  Plan discussed with daughter and patient as well as the points above and they wish to proceed.  NPO p midnight Consent Heparin off on call to OR Will check serum albumin to gauge nutritional status for wound healing  Ruta Hinds 03/10/2016 12:15 PM --  Laboratory Lab Results:  Recent Labs  03/09/16 1940 03/10/16 0150  WBC 18.3* 20.5*  HGB 7.5* 7.6*  HCT 22.6* 23.3*  PLT 144* 172   BMET  Recent Labs  03/09/16 1940 03/10/16 0150  NA 134* 134*  K 3.4* 3.7  CL 97* 96*  CO2 28 26  GLUCOSE 106* 107*  BUN 15 16  CREATININE 4.49* 5.21*  CALCIUM 8.0* 8.3*    COAG Lab Results  Component Value Date   INR 1.69 03/10/2016   INR 1.39 03/07/2016   INR 1.81 03/05/2016   No results found for: PTT

## 2016-03-12 ENCOUNTER — Inpatient Hospital Stay (HOSPITAL_COMMUNITY): Payer: Medicare Other

## 2016-03-12 ENCOUNTER — Encounter (HOSPITAL_COMMUNITY): Payer: Self-pay | Admitting: Vascular Surgery

## 2016-03-12 DIAGNOSIS — N186 End stage renal disease: Secondary | ICD-10-CM | POA: Diagnosis not present

## 2016-03-12 DIAGNOSIS — K219 Gastro-esophageal reflux disease without esophagitis: Secondary | ICD-10-CM

## 2016-03-12 DIAGNOSIS — Z992 Dependence on renal dialysis: Secondary | ICD-10-CM | POA: Diagnosis not present

## 2016-03-12 DIAGNOSIS — Z7189 Other specified counseling: Secondary | ICD-10-CM

## 2016-03-12 DIAGNOSIS — T8640 Unspecified complication of liver transplant: Secondary | ICD-10-CM | POA: Diagnosis not present

## 2016-03-12 LAB — CBC
HCT: 25.4 % — ABNORMAL LOW (ref 36.0–46.0)
Hemoglobin: 8 g/dL — ABNORMAL LOW (ref 12.0–15.0)
MCH: 31.1 pg (ref 26.0–34.0)
MCHC: 31.5 g/dL (ref 30.0–36.0)
MCV: 98.8 fL (ref 78.0–100.0)
Platelets: 192 10*3/uL (ref 150–400)
RBC: 2.57 MIL/uL — ABNORMAL LOW (ref 3.87–5.11)
RDW: 16.7 % — AB (ref 11.5–15.5)
WBC: 13.1 10*3/uL — AB (ref 4.0–10.5)

## 2016-03-12 LAB — IRON AND TIBC
IRON: 22 ug/dL — AB (ref 28–170)
Saturation Ratios: 12 % (ref 10.4–31.8)
TIBC: 181 ug/dL — AB (ref 250–450)
UIBC: 159 ug/dL

## 2016-03-12 LAB — PHOSPHORUS: Phosphorus: 4.9 mg/dL — ABNORMAL HIGH (ref 2.5–4.6)

## 2016-03-12 LAB — COMPREHENSIVE METABOLIC PANEL
ALBUMIN: 1.7 g/dL — AB (ref 3.5–5.0)
ALT: 13 U/L — ABNORMAL LOW (ref 14–54)
AST: 29 U/L (ref 15–41)
Alkaline Phosphatase: 235 U/L — ABNORMAL HIGH (ref 38–126)
Anion gap: 7 (ref 5–15)
BUN: 14 mg/dL (ref 6–20)
CO2: 30 mmol/L (ref 22–32)
Calcium: 8.6 mg/dL — ABNORMAL LOW (ref 8.9–10.3)
Chloride: 98 mmol/L — ABNORMAL LOW (ref 101–111)
Creatinine, Ser: 5.06 mg/dL — ABNORMAL HIGH (ref 0.44–1.00)
GFR calc Af Amer: 10 mL/min — ABNORMAL LOW (ref >60)
GFR, EST NON AFRICAN AMERICAN: 8 mL/min — AB (ref >60)
Glucose, Bld: 64 mg/dL — ABNORMAL LOW (ref 65–99)
POTASSIUM: 3.4 mmol/L — AB (ref 3.5–5.1)
Sodium: 135 mmol/L (ref 135–145)
Total Bilirubin: 1.3 mg/dL — ABNORMAL HIGH (ref 0.3–1.2)
Total Protein: 5.6 g/dL — ABNORMAL LOW (ref 6.5–8.1)

## 2016-03-12 LAB — GLUCOSE, CAPILLARY
GLUCOSE-CAPILLARY: 222 mg/dL — AB (ref 65–99)
Glucose-Capillary: 113 mg/dL — ABNORMAL HIGH (ref 65–99)
Glucose-Capillary: 125 mg/dL — ABNORMAL HIGH (ref 65–99)

## 2016-03-12 LAB — CULTURE, BLOOD (ROUTINE X 2): CULTURE: NO GROWTH

## 2016-03-12 LAB — HEPARIN LEVEL (UNFRACTIONATED): HEPARIN UNFRACTIONATED: 0.38 [IU]/mL (ref 0.30–0.70)

## 2016-03-12 MED ORDER — LIDOCAINE-PRILOCAINE 2.5-2.5 % EX CREA: 1.0000 "application " | TOPICAL_CREAM | CUTANEOUS | Status: DC | PRN

## 2016-03-12 MED ORDER — ALTEPLASE 2 MG IJ SOLR: 2.0000 mg | Freq: Once | INTRAMUSCULAR | Status: DC | PRN

## 2016-03-12 MED ORDER — HEPARIN SODIUM (PORCINE) 1000 UNIT/ML DIALYSIS: 1000.0000 [IU] | INTRAMUSCULAR | Status: DC | PRN

## 2016-03-12 MED ORDER — LIDOCAINE HCL (PF) 1 % IJ SOLN: 5.0000 mL | INTRAMUSCULAR | Status: DC | PRN

## 2016-03-12 MED ORDER — DARBEPOETIN ALFA 100 MCG/0.5ML IJ SOSY
PREFILLED_SYRINGE | INTRAMUSCULAR | Status: AC
  Administered 2016-03-12: 100 ug via INTRAVENOUS
  Filled 2016-03-12: qty 0.5

## 2016-03-12 MED ORDER — SODIUM CHLORIDE 0.9 % IV SOLN: 100.0000 mL | INTRAVENOUS | Status: DC | PRN

## 2016-03-12 MED ORDER — PENTAFLUOROPROP-TETRAFLUOROETH EX AERO: 1.0000 "application " | INHALATION_SPRAY | CUTANEOUS | Status: DC | PRN

## 2016-03-12 MED ORDER — CALCITRIOL 0.25 MCG PO CAPS
0.2500 ug | ORAL_CAPSULE | ORAL | Status: DC
  Administered 2016-03-12 – 2016-03-19 (×6): 0.25 ug via ORAL
  Filled 2016-03-12 (×4): qty 1

## 2016-03-12 NOTE — Consult Note (Signed)
   [facility] Inpatient Consult   03/12/2016  [name] March 21, 1955 [id number]   Patient is in the [facility] Registry. Patient is assess for 3 admissions in the past 6 months.  Chart review reveals the patient is a 61 year old with PMH of ESRD on HD MWF, CAD with LIMA, AVR, liver transplant secondary to hep C, COPD, DM, and HTN. Presents to ED on 1/24 from HD with A.Flutter HR of 180. Patient reported left foot pain in which she is being followed by vascular. Upon arrival to ED patient reports chest pain and had pos troponins as well as a small left pleural effusion on chest xray. Cardiology was consulted and believed that elevated trops were due to ischemic demand in setting of a possible infection. During stay patient has been febrile, lactic acid 1.5, and pending blood cultures. Patient was admitted to step-down under triad. On 1/26 patient became increasingly lethargic and hypotensive. [facility] M was consulted and patient transferred to ICU. Patient did not wish to be intubated on BiPAP. Chart review reveals the patient and family are being followed by Palliative Care for goals of care needs. No [facility] needs noted at this time.  For questions, please contact:  Natividad Brood, RN BSN [facility] Liaison  [phone] business mobile phone Toll free office [phone]

## 2016-03-12 NOTE — Progress Notes (Signed)
[facility] Progress Note  Dialysis Orders: [name] MWF 3h 94min 59.5kg Hep none L fem AVG Po calcitriol 0.33mcg /HD MIrcera 60 mcg q 2wks *was to start 03/05/16" Other op labs hgb 9.8 Ca 9.3 phos 8.7 PTH 1294  Assessment/Plan: 1. PAD/ L foot ischemia - s/p L thigh AVG ligation 1/30 w/ right IJ cath insertion by VVS to hopefully improve wound healing on the left heel. 2. Fever / hypotension - ? Heel/ abtx per primary WBC 17 K, BC 1/25 1/2coag neg staph - BC 1/25 and 1/28 no growth; on Maxipime 3. ESRD - MWF - next HD Wed K 3.4on added K bath to start 4. Aflutter/ RVR/AVR on chronic coumadinI pharm following - on heparin 5. Chest Pain / CAD ho / AOR - card consulted "demand ischemia " 6. BP/Volume - net net UF Sunday 0  UF Monday 500. BP labile- at best has been getting to post HD of 60.5 at the out patient unit with relatively small gains; needs volume down per 1/28 CXR 7. Anemia - hgb 8.7>7.1 >8 - no transfusion Start ESA today- was supposed to have started last week for outpt hgb of 9.8 but admitted here- no recent Fe studies - will send today 8. 2HPTH - cont vit D/ phoslo 9. HO Liver transplant sec Hep c- on meds 10. DM Type 2 - per admit  11. COPD 35. Nutrition - alb 1.7 ? Liver hx/malnutrition/chronic wounds- add prostat and supplements- liberalize diet to regular 13. Left arm swelling- she says there is a blockage and it started while here; no mention of it in previous note - discussed with primary-to do [place] 14. Goals of care - seen by Palliative Care- appreciate help - multiple medical issues; DNR/doesn't want aggressive measures    Myriam Jacobson, PA-C [place] [phone] 03/12/2016,9:24 AM  LOS: 6 days   Patient seen as she was coming off dialysis. No complaints and she tolerated the treatment. Agree with PA A/P  Otelia Santee, MD [facility]  Subjective:   Weak,  Objective Vitals:   03/12/16 0730 03/12/16 0800 03/12/16 0830  03/12/16 0900  BP: 134/70 139/78 134/72 116/67  Pulse: 89 87 98 96  Resp: 18 18 18 18   Temp:      TempSrc:      SpO2:      Weight:      Height:       Physical Exam General: on HD NAD Heart: RRR Lungs: no rales Abdomen: soft NT Extremities: left heel dry ulcer  No sig LE edema; sig left upper arm swelling Dialysis Access: right IJ placed 1/30 (left thigh ligated)   Additional Objective Labs: Basic Metabolic Panel:  Recent Labs Lab 03/09/16 1940  03/11/16 0624 03/11/16 1821 03/12/16 0702  NA 134*  < > 135 133* 135  K 3.4*  < > 3.6 3.4* 3.4*  CL 97*  < > 99* 96* 98*  CO2 28  < > 26 30 30   GLUCOSE 106*  < > 66 99 64*  BUN 15  < > 7 10 14   CREATININE 4.49*  < > 3.62* 4.27* 5.06*  CALCIUM 8.0*  < > 9.1 8.5* 8.6*  PHOS 4.5  --   --  4.7* 4.9*  < > = values in this interval not displayed. Liver Function Tests:  Recent Labs Lab 03/07/16 0425  03/11/16 0624 03/11/16 1821 03/12/16 0702  AST 33  --   --   --  29  ALT 16  --   --   --  13*  ALKPHOS 314*  --   --   --  235*  BILITOT 3.0*  --   --   --  1.3*  PROT 5.8*  --   --   --  5.6*  ALBUMIN 1.9*  < > 2.0* 1.6* 1.7*  < > = values in this interval not displayed. No results for input(s): LIPASE, AMYLASE in the last 168 hours. CBC:  Recent Labs Lab 03/09/16 1940 03/10/16 0150 03/11/16 0624 03/11/16 1821 03/12/16 0702  WBC 18.3* 20.5* 17.3* 11.8* 13.1*  HGB 7.5* 7.6* 8.7* 7.1* 8.0*  HCT 22.6* 23.3* 27.1* 21.7* 25.4*  MCV 96.2 97.1 97.8 98.6 98.8  PLT 144* 172 217 174 192   Blood Culture    Component Value Date/Time   SDES BLOOD RIGHT HAND 03/09/2016 1208   SPECREQUEST IN PEDIATRIC BOTTLE 2.5CC 03/09/2016 1208   CULT NO GROWTH 2 DAYS 03/09/2016 1208   REPTSTATUS PENDING 03/09/2016 1208    Cardiac Enzymes:  Recent Labs Lab 03/05/16 1529 03/05/16 2122 03/06/16 0248 03/09/16 1024  TROPONINI 1.38* 2.19* 3.48* 2.78*   CBG:  Recent Labs Lab 03/11/16 1350 03/11/16 1424 03/11/16 1855  03/11/16 2131 03/11/16 2223  GLUCAP 68 82 90 66 100*   Iron Studies: No results for input(s): IRON, TIBC, TRANSFERRIN, FERRITIN in the last 72 hours. Lab Results  Component Value Date   INR 1.69 03/10/2016   INR 1.39 03/07/2016   INR 1.81 03/05/2016   Studies/Results: Dg Chest Port 1 View  Result Date: 03/12/2016 CLINICAL DATA:  Check dialysis catheter positioning EXAM: PORTABLE CHEST 1 VIEW COMPARISON:  03/09/2016 FINDINGS: Cardiac shadow is enlarged. Postsurgical changes are again seen. A new right jugular dialysis catheter is noted in satisfactory position. Vascular congestion and bilateral effusions are again seen likely related to volume overload. No focal confluent infiltrate is seen. IMPRESSION: Dialysis catheter in satisfactory position. Changes of CHF and pleural effusions likely related to volume overload Electronically Signed   By: Inez Catalina M.D.   On: 03/12/2016 08:13   Dg Fluoro Guide Cv Line-no Report  Result Date: 03/11/2016 Fluoroscopy was utilized by the requesting physician.  No radiographic interpretation.   Medications: . sodium chloride 10 mL/hr at 03/11/16 1324  . dextrose 10 mL/hr at 03/10/16 0000  . heparin 1,400 Units/hr (03/11/16 1850)   . calcitRIOL  0.25 mcg Oral Q M,W,F-1800  . calcium acetate  667 mg Oral TID WC  . ceFEPime (MAXIPIME) IV  1 g Intravenous Q24H  . darbepoetin (ARANESP) injection - DIALYSIS  100 mcg Intravenous Q Wed-HD  . feeding supplement (NEPRO CARB STEADY)  237 mL Oral BID BM  . feeding supplement (PRO-STAT SUGAR FREE 64)  30 mL Oral BID  . gabapentin  300 mg Oral QPM  . insulin aspart  0-5 Units Subcutaneous QHS  . insulin aspart  0-9 Units Subcutaneous TID WC  . insulin detemir  5 Units Subcutaneous QHS  . levothyroxine  175 mcg Oral QAC breakfast  . multivitamin  1 tablet Oral QHS  . senna-docusate  1 tablet Oral Daily  . sodium chloride flush  3 mL Intravenous Q12H  . tacrolimus  2 mg Oral BID

## 2016-03-12 NOTE — Progress Notes (Signed)
[facility]  Subjective  - says pain in foot better but still present   Objective 115/75 (!) 102 98.1 F (36.7 C) (Oral) 18 100%  Intake/Output Summary (Last 24 hours) at 03/12/16 1729 Last data filed at 03/12/16 1043  Gross per 24 hour  Intake              100 ml  Output             2000 ml  Net            -1900 ml   Incision clean no hematoma foot appearance unchanged  Assessment/Planning: Will sign off at this point Pt can follow up in 4-6 weeks to consider new access  Ruta Hinds 03/12/2016 5:29 PM --  Laboratory Lab Results:  Recent Labs  03/11/16 1821 03/12/16 0702  WBC 11.8* 13.1*  HGB 7.1* 8.0*  HCT 21.7* 25.4*  PLT 174 192   BMET  Recent Labs  03/11/16 1821 03/12/16 0702  NA 133* 135  K 3.4* 3.4*  CL 96* 98*  CO2 30 30  GLUCOSE 99 64*  BUN 10 14  CREATININE 4.27* 5.06*  CALCIUM 8.5* 8.6*    COAG Lab Results  Component Value Date   INR 1.69 03/10/2016   INR 1.39 03/07/2016   INR 1.81 03/05/2016   No results found for: PTT

## 2016-03-12 NOTE — Progress Notes (Signed)
PROGRESS NOTE  [name]  [id number] DOB: 05/28/1955 DOA: 03/05/2016 PCP: No PCP Per Patient Outpatient Specialists:  Subjective: Patient seen while she was in dialysis, complaining about left upper extremity swelling and pain.  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 Presents to ED on 1/24 from HD with A.Flutter HR of 180. Patient reported left foot pain in which she is being followed by vascular. Upon arrival to ED patient reports chest pain and had pos troponins as well as a small left pleural effusion on chest xray. Cardiology was consulted and believed that elevated trops were due to ischemic demand in setting of a possible infection. During stay patient has been febrile, lactic acid 1.5, and pending blood cultures. Patient was admitted to step-down under [facility]. On 1/26 patient became increasingly lethargic and hypotensive. [facility] M was consulted and patient transferred to ICU. Patient did not wish to be intubated on BiPAP. She was seen by vascular surgery plan for removal of left thigh graft and place new thigh graft and catheter. Initially patient and family refused  the procedure.  Assessment & Plan:   Active Problems:   Hypertensive heart disease   GERD   End stage renal disease on dialysis-TTS   CAD (coronary artery disease), native coronary artery   History of prosthetic aortic valve replacement   COPD (chronic obstructive pulmonary disease) (HCC)   S/P liver transplant (HCC)   Chronic hepatitis C without hepatic coma (HCC)   Elevated troponin   Claudication of left lower extremity (HCC)   Sepsis (HCC)   Hypotension   Goals of care, counseling/discussion   Palliative care by specialist   Chest pain/CAD -Patient has significant history of CAD, troponin was elevated. Cardiology was consulted, initially nuclear Myoview was planned but canceled by cardiology. -At this time no  additional workup is recommended. Continue when necessary morphine for pain  Coagulase negative staph bacteremia -1/2 Blood cultures from 1/26 showed coagulase-negative staph likely contaminant. -Repeat cultures from 1/28 is still NGTD. Already on vancomycin for HCAP.  Healthcare associated pneumonia -Patient diagnosed with healthcare associated pneumonia, started on vancomycin and cefepime.  -Continue supportive management with bronchodilators, mucolytics and oxygen as needed.  Leukocytosis. -She has persistent leukocytosis was 17.2 on admission, went up to 20.5. -Wbc's is 13.1 today, already on antibiotics.  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 Continue Aranesp  Diabetes mellitus- blood glucose is stable, continue sliding scale insulin with NovoLog.  Chronic left lower extremity pain-patient was seen by vascular surgery, initially there was planned to remove the left thigh graft and replaced with high new left thigh graft and catheter. Patient and family earlier had refused the procedure, yesterday morning  patient told palliative  care that she wants to have the procedure.Vascular surgery was re consulted, plan to undergo ligation of left AV graft today.[name]  COPD- Stable. Continue duo nebs when necessary  ESRD on hemodialysis- followed by nephrology, on hemodialysis Tuesday Thursday and Saturday.  Chronic immunosuppression in the setting of liver transplantation/Hepatitis C- continue Prograf  Hypothyroidism-TSH 4.836, continue Synthroid.   DVT prophylaxis:  Code Status: DNR Family Communication:  Disposition Plan:  Diet: Diet regular Room service appropriate? Yes; Fluid consistency: Thin; Fluid restriction: 1200 mL Fluid  Consultants:   Nephrology  Procedures:   None  Antimicrobials:   Cefepime and vancomycin  Objective: Vitals:   03/12/16 0900 03/12/16 0930 03/12/16  1000 03/12/16 1030  BP: 116/67 119/74 123/69 115/75  Pulse: 96 100  94 (!) 102  Resp: 18 18 18 18   Temp:      TempSrc:      SpO2:      Weight:      Height:        Intake/Output Summary (Last 24 hours) at 03/12/16 1217 Last data filed at 03/12/16 1043  Gross per 24 hour  Intake              450 ml  Output             2050 ml  Net            -1600 ml   Filed Weights   03/11/16 1205 03/11/16 2117 03/12/16 0645  Weight: 62.8 kg (138 lb 7.2 oz) 64.9 kg (143 lb 1.6 oz) 65.9 kg (145 lb 4.5 oz)    Examination: General exam: Appears calm and comfortable  Respiratory system: Clear to auscultation. Respiratory effort normal. Cardiovascular system: S1 & S2 heard, RRR. No JVD, murmurs, rubs, gallops or clicks. No pedal edema. Gastrointestinal system: Abdomen is nondistended, soft and nontender. No organomegaly or masses felt. Normal bowel sounds heard. Central nervous system: Alert and oriented. No focal neurological deficits. Extremities: Symmetric 5 x 5 power. Skin: No rashes, lesions or ulcers Psychiatry: Judgement and insight appear normal. Mood & affect appropriate.   Data Reviewed: I have personally reviewed following labs and imaging studies  CBC:  Recent Labs Lab 03/09/16 1940 03/10/16 0150 03/11/16 0624 03/11/16 1821 03/12/16 0702  WBC 18.3* 20.5* 17.3* 11.8* 13.1*  HGB 7.5* 7.6* 8.7* 7.1* 8.0*  HCT 22.6* 23.3* 27.1* 21.7* 25.4*  MCV 96.2 97.1 97.8 98.6 98.8  PLT 144* 172 217 174 [id number]   Basic Metabolic Panel:  Recent Labs Lab 03/09/16 1940 03/10/16 0150 03/11/16 0624 03/11/16 1821 03/12/16 0702  NA 134* 134* 135 133* 135  K 3.4* 3.7 3.6 3.4* 3.4*  CL 97* 96* 99* 96* 98*  CO2 28 26 26 30 30   GLUCOSE 106* 107* 66 99 64*  BUN 15 16 7 10 14   CREATININE 4.49* 5.21* 3.62* 4.27* 5.06*  CALCIUM 8.0* 8.3* 9.1 8.5* 8.6*  PHOS 4.5  --   --  4.7* 4.9*   GFR: Estimated Creatinine Clearance: 10.9 mL/min (by C-G formula based on SCr of 5.06 mg/dL (H)). Liver Function Tests:  Recent Labs Lab 03/07/16 0425 03/09/16 1940  03/11/16 0624 03/11/16 1821 03/12/16 0702  AST 33  --   --   --  29  ALT 16  --   --   --  13*  ALKPHOS 314*  --   --   --  235*  BILITOT 3.0*  --   --   --  1.3*  PROT 5.8*  --   --   --  5.6*  ALBUMIN 1.9* 1.6* 2.0* 1.6* 1.7*   No results for input(s): LIPASE, AMYLASE in the last 168 hours. No results for input(s): AMMONIA in the last 168 hours. Coagulation Profile:  Recent Labs Lab 03/07/16 0852 03/10/16 0150  INR 1.39 1.69   Cardiac Enzymes:  Recent Labs Lab 03/05/16 1529 03/05/16 2122 03/06/16 0248 03/09/16 1024  TROPONINI 1.38* 2.19* 3.48* 2.78*   BNP (last 3 results) No results for input(s): PROBNP in the last 8760 hours. HbA1C: No results for input(s): HGBA1C in the last 72 hours. CBG:  Recent Labs Lab 03/11/16 1424 03/11/16 1855 03/11/16 2131 03/11/16 2223 03/12/16 1102  GLUCAP 82 90 66 100* 113*   Lipid Profile: No results for input(s): CHOL, HDL, LDLCALC, TRIG, CHOLHDL, LDLDIRECT in the last 72 hours. Thyroid Function Tests: No results for input(s): TSH, T4TOTAL, FREET4, T3FREE, THYROIDAB in the last 72 hours. Anemia Panel: No results for input(s): VITAMINB12, FOLATE, FERRITIN, TIBC, IRON, RETICCTPCT in the last 72 hours. Urine analysis:    Component Value Date/Time   COLORURINE YELLOW 11/02/2012 0428   APPEARANCEUR CLOUDY (A) 11/02/2012 0428   LABSPEC 1.011 11/02/2012 0428   PHURINE 8.0 11/02/2012 0428   GLUCOSEU 250 (A) 11/02/2012 0428   HGBUR LARGE (A) 11/02/2012 0428   BILIRUBINUR NEGATIVE 11/02/2012 0428   KETONESUR NEGATIVE 11/02/2012 0428   PROTEINUR >300 (A) 11/02/2012 0428   UROBILINOGEN 0.2 11/02/2012 0428   NITRITE NEGATIVE 11/02/2012 0428   LEUKOCYTESUR NEGATIVE 11/02/2012 0428   Sepsis Labs: @LABRCNTIP (procalcitonin:4,lacticidven:4)  ) Recent Results (from the past 240 hour(s))  MRSA PCR Screening     Status: None   Collection Time: 03/05/16  6:37 PM  Result Value Ref Range Status   MRSA by PCR NEGATIVE NEGATIVE Final     Comment:        The GeneXpert MRSA Assay (FDA approved for NASAL specimens only), is one component of a comprehensive MRSA colonization surveillance program. It is not intended to diagnose MRSA infection nor to guide or monitor treatment for MRSA infections.   Culture, blood (routine x 2)     Status: None   Collection Time: 03/06/16  5:48 AM  Result Value Ref Range Status   Specimen Description BLOOD LEFT HAND  Final   Special Requests BOTTLES DRAWN AEROBIC AND ANAEROBIC 10CC EA  Final   Culture NO GROWTH 5 DAYS  Final   Report Status 03/11/2016 FINAL  Final  Culture, blood (routine x 2)     Status: None   Collection Time: 03/06/16  5:54 AM  Result Value Ref Range Status   Specimen Description BLOOD RIGHT HAND  Final   Special Requests BOTTLES DRAWN AEROBIC AND ANAEROBIC 10CC EA  Final   Culture NO GROWTH 5 DAYS  Final   Report Status 03/11/2016 FINAL  Final  Culture, blood (Routine X 2) w Reflex to ID Panel     Status: None (Preliminary result)   Collection Time: 03/07/16  8:52 AM  Result Value Ref Range Status   Specimen Description BLOOD LEFT HAND  Final   Special Requests BOTTLES DRAWN AEROBIC ONLY 5CC  Final   Culture NO GROWTH 4 DAYS  Final   Report Status PENDING  Incomplete  Culture, blood (Routine X 2) w Reflex to ID Panel     Status: Abnormal   Collection Time: 03/07/16  8:52 AM  Result Value Ref Range Status   Specimen Description BLOOD RIGHT HAND  Final   Special Requests IN PEDIATRIC BOTTLE 3CC  Final   Culture  Setup Time   Final    GRAM POSITIVE COCCI IN CLUSTERS IN PEDIATRIC BOTTLE CRITICAL RESULT CALLED TO, READ BACK BY AND VERIFIED WITH: M MACCIA,PHARMD AT [id number] 03/08/16 BY L BENFIELD    Culture (A)  Final    STAPHYLOCOCCUS SPECIES (COAGULASE NEGATIVE) THE SIGNIFICANCE OF ISOLATING THIS ORGANISM FROM A SINGLE SET OF BLOOD CULTURES WHEN MULTIPLE SETS ARE DRAWN IS UNCERTAIN. PLEASE NOTIFY THE MICROBIOLOGY DEPARTMENT WITHIN ONE WEEK IF SPECIATION AND  SENSITIVITIES ARE REQUIRED.    Report Status 03/10/2016 FINAL  Final  Blood Culture ID Panel (Reflexed)     Status: Abnormal   Collection Time:  03/07/16  8:52 AM  Result Value Ref Range Status   Enterococcus species NOT DETECTED NOT DETECTED Final   Listeria monocytogenes NOT DETECTED NOT DETECTED Final   Staphylococcus species DETECTED (A) NOT DETECTED Final    Comment: CRITICAL RESULT CALLED TO, READ BACK BY AND VERIFIED WITH: Ailene Rud AT [id number] 03/08/16 BY L BENFIELD    Staphylococcus aureus NOT DETECTED NOT DETECTED Final   Methicillin resistance NOT DETECTED NOT DETECTED Final   Streptococcus species NOT DETECTED NOT DETECTED Final   Streptococcus agalactiae NOT DETECTED NOT DETECTED Final   Streptococcus pneumoniae NOT DETECTED NOT DETECTED Final   Streptococcus pyogenes NOT DETECTED NOT DETECTED Final   Acinetobacter baumannii NOT DETECTED NOT DETECTED Final   Enterobacteriaceae species NOT DETECTED NOT DETECTED Final   Enterobacter cloacae complex NOT DETECTED NOT DETECTED Final   Escherichia coli NOT DETECTED NOT DETECTED Final   Klebsiella oxytoca NOT DETECTED NOT DETECTED Final   Klebsiella pneumoniae NOT DETECTED NOT DETECTED Final   Proteus species NOT DETECTED NOT DETECTED Final   Serratia marcescens NOT DETECTED NOT DETECTED Final   Haemophilus influenzae NOT DETECTED NOT DETECTED Final   Neisseria meningitidis NOT DETECTED NOT DETECTED Final   Pseudomonas aeruginosa NOT DETECTED NOT DETECTED Final   Candida albicans NOT DETECTED NOT DETECTED Final   Candida glabrata NOT DETECTED NOT DETECTED Final   Candida krusei NOT DETECTED NOT DETECTED Final   Candida parapsilosis NOT DETECTED NOT DETECTED Final   Candida tropicalis NOT DETECTED NOT DETECTED Final  Respiratory Panel by PCR     Status: None   Collection Time: 03/08/16  9:45 AM  Result Value Ref Range Status   Adenovirus NOT DETECTED NOT DETECTED Final   Coronavirus 229E NOT DETECTED NOT DETECTED Final    Coronavirus HKU1 NOT DETECTED NOT DETECTED Final   Coronavirus NL63 NOT DETECTED NOT DETECTED Final   Coronavirus OC43 NOT DETECTED NOT DETECTED Final   Metapneumovirus NOT DETECTED NOT DETECTED Final   Rhinovirus / Enterovirus NOT DETECTED NOT DETECTED Final   Influenza A NOT DETECTED NOT DETECTED Final   Influenza B NOT DETECTED NOT DETECTED Final   Parainfluenza Virus 1 NOT DETECTED NOT DETECTED Final   Parainfluenza Virus 2 NOT DETECTED NOT DETECTED Final   Parainfluenza Virus 3 NOT DETECTED NOT DETECTED Final   Parainfluenza Virus 4 NOT DETECTED NOT DETECTED Final   Respiratory Syncytial Virus NOT DETECTED NOT DETECTED Final   Bordetella pertussis NOT DETECTED NOT DETECTED Final   Chlamydophila pneumoniae NOT DETECTED NOT DETECTED Final   Mycoplasma pneumoniae NOT DETECTED NOT DETECTED Final  Culture, blood (routine x 2)     Status: None (Preliminary result)   Collection Time: 03/09/16 11:59 AM  Result Value Ref Range Status   Specimen Description BLOOD HEMODIALYSIS CATHETER  Final   Special Requests BOTTLES DRAWN AEROBIC AND ANAEROBIC 10CC EAC  Final   Culture NO GROWTH 2 DAYS  Final   Report Status PENDING  Incomplete  Culture, blood (routine x 2)     Status: None (Preliminary result)   Collection Time: 03/09/16 12:08 PM  Result Value Ref Range Status   Specimen Description BLOOD RIGHT HAND  Final   Special Requests IN PEDIATRIC BOTTLE 2.5CC  Final   Culture NO GROWTH 2 DAYS  Final   Report Status PENDING  Incomplete  Surgical pcr screen     Status: None   Collection Time: 03/10/16 11:11 PM  Result Value Ref Range Status   MRSA, PCR NEGATIVE NEGATIVE Final  Staphylococcus aureus NEGATIVE NEGATIVE Final    Comment:        The Xpert SA Assay (FDA approved for NASAL specimens in patients over 34 years of age), is one component of a comprehensive surveillance program.  Test performance has been validated by [facility] for patients greater than or equal to 50 year  old. It is not intended to diagnose infection nor to guide or monitor treatment.   MRSA PCR Screening     Status: None   Collection Time: 03/11/16  1:00 PM  Result Value Ref Range Status   MRSA by PCR NEGATIVE NEGATIVE Final    Comment:        The GeneXpert MRSA Assay (FDA approved for NASAL specimens only), is one component of a comprehensive MRSA colonization surveillance program. It is not intended to diagnose MRSA infection nor to guide or monitor treatment for MRSA infections.      Invalid input(s): PROCALCITONIN, LACTICACIDVEN   Radiology Studies: Dg Chest Port 1 View  Result Date: 03/12/2016 CLINICAL DATA:  Check dialysis catheter positioning EXAM: PORTABLE CHEST 1 VIEW COMPARISON:  03/09/2016 FINDINGS: Cardiac shadow is enlarged. Postsurgical changes are again seen. A new right jugular dialysis catheter is noted in satisfactory position. Vascular congestion and bilateral effusions are again seen likely related to volume overload. No focal confluent infiltrate is seen. IMPRESSION: Dialysis catheter in satisfactory position. Changes of CHF and pleural effusions likely related to volume overload Electronically Signed   By: Inez Catalina M.D.   On: 03/12/2016 08:13   Dg Fluoro Guide Cv Line-no Report  Result Date: 03/11/2016 Fluoroscopy was utilized by the requesting physician.  No radiographic interpretation.        Scheduled Meds: . calcitRIOL  0.25 mcg Oral Q M,W,F-1800  . calcium acetate  667 mg Oral TID WC  . ceFEPime (MAXIPIME) IV  1 g Intravenous Q24H  . darbepoetin (ARANESP) injection - DIALYSIS  100 mcg Intravenous Q Wed-HD  . feeding supplement (NEPRO CARB STEADY)  237 mL Oral BID BM  . feeding supplement (PRO-STAT SUGAR FREE 64)  30 mL Oral BID  . gabapentin  300 mg Oral QPM  . insulin aspart  0-5 Units Subcutaneous QHS  . insulin aspart  0-9 Units Subcutaneous TID WC  . insulin detemir  5 Units Subcutaneous QHS  . levothyroxine  175 mcg Oral QAC  breakfast  . multivitamin  1 tablet Oral QHS  . senna-docusate  1 tablet Oral Daily  . sodium chloride flush  3 mL Intravenous Q12H  . tacrolimus  2 mg Oral BID   Continuous Infusions: . sodium chloride 10 mL/hr at 03/11/16 1324  . dextrose 10 mL/hr at 03/10/16 0000  . heparin 1,400 Units/hr (03/12/16 0825)     LOS: 6 days    Time spent: 35 minutes    Tyona Nilsen A, MD [facility] Pager [phone]  If 7PM-7AM, please contact night-coverage [email or web address] Password [facility] [place] 03/12/2016, 12:17 PM

## 2016-03-12 NOTE — Progress Notes (Addendum)
ANTICOAGULATION and ANTIMICROBIAL CONSULT NOTE - Follow Up Consult  Pharmacy Consult for heparin; cefepime and vancomycin Indication: mechanical AVR, atrial flutter; pnuemonia  Allergies  Allergen Reactions  . Acetaminophen Other (See Comments)    Liver transplant recipient   . Codeine Itching  . Mirtazapine Other (See Comments)    hallucination  . Morphine Itching and Other (See Comments)    hallucinate    Patient Measurements: Height: 5\' 4"  (162.6 cm) Weight: 145 lb 4.5 oz (65.9 kg) IBW/kg (Calculated) : 54.7 Heparin Dosing Weight: 59 kg  Vital Signs: Temp: 98.1 F (36.7 C) (01/31 0645) Temp Source: Oral (01/31 0645) BP: 116/67 (01/31 0900) Pulse Rate: 96 (01/31 0900)  Labs:  Recent Labs  03/09/16 1024  03/10/16 0150 03/11/16 0624 03/11/16 1821 03/12/16 0702 03/12/16 0719  HGB  --   < > 7.6* 8.7* 7.1* 8.0*  --   HCT  --   < > 23.3* 27.1* 21.7* 25.4*  --   PLT  --   < > 172 217 174 192  --   LABPROT  --   --  20.1*  --   --   --   --   INR  --   --  1.69  --   --   --   --   HEPARINUNFRC  --   --  0.36 0.31  --   --  0.38  CREATININE  --   < > 5.21* 3.62* 4.27* 5.06*  --   TROPONINI 2.78*  --   --   --   --   --   --   < > = values in this interval not displayed.  Estimated Creatinine Clearance: 10.9 mL/min (by C-G formula based on SCr of 5.06 mg/dL (H)).  Assessment: 61 yo F on heparin bridge for atrial flutter and hx mechanical AVR.  Pt was on Coumadin PTA which is being held for a inpatient procedure. Heparin remains therapeutic at 0.38 units/mL on 1400 units/hr.   INR on 1/29 was 1.69- orders for warfarin have not yet been resumed.  Hgb 8, plts 192- received dose of Aranesp 171mcg today (given 1 day too early based on last dose being given 1/25). No bleeding noted.  Patient also continues on cefepime and vancomycin for HCAP.  Estimated pre-HD vancomycin level today is ~32.58mcg/mL which is above goal. If patient tolerates prescribed HD session (3.75h @  BFR 400), level will be brought down to ~6mcg/mL which is above post-HD vancomycin level goal of 5-26mcg/mL.  Vancomycin 1/26>> Cefepime 1/26>>  1/25 BCx: neg 1/26 BCx: 1/2 CONS 1/27 Resp panel negative 1/28 BCX: ngtd 1/30 MRSA PCR neg  Goal of Therapy:  Heparin level 0.3-0.7 units/ml Monitor platelets by anticoagulation protocol: Yes   Plan:  Continue heparin at 1400 units/hr Ddaily heparin level and CBC Follow-up plans for restarting oral anticoagulation. Continue cefepime 1g IV q24h NO vancomycin needed after HD on 1/31 Follow c/s, clinical progression, LOT, HD schedule/tolerance Recommend pre-HD vancomycin level prior to next session if vancomycin is to continue  Orchid Glassberg D. Gilberta Peeters, PharmD, BCPS Clinical Pharmacist Pager: [phone] 03/12/2016 9:19 AM

## 2016-03-13 ENCOUNTER — Inpatient Hospital Stay (HOSPITAL_COMMUNITY): Payer: Medicare Other

## 2016-03-13 ENCOUNTER — Encounter (HOSPITAL_COMMUNITY): Payer: Medicare Other

## 2016-03-13 DIAGNOSIS — M7989 Other specified soft tissue disorders: Secondary | ICD-10-CM

## 2016-03-13 LAB — GLUCOSE, CAPILLARY
Glucose-Capillary: 80 mg/dL (ref 65–99)
Glucose-Capillary: 90 mg/dL (ref 65–99)
Glucose-Capillary: 92 mg/dL (ref 65–99)
Glucose-Capillary: 185 mg/dL — ABNORMAL HIGH (ref 65–99)

## 2016-03-13 LAB — CBC
HEMATOCRIT: 25.4 % — AB (ref 36.0–46.0)
HEMOGLOBIN: 8.2 g/dL — AB (ref 12.0–15.0)
MCH: 31.8 pg (ref 26.0–34.0)
MCHC: 32.3 g/dL (ref 30.0–36.0)
MCV: 98.4 fL (ref 78.0–100.0)
PLATELETS: 195 10*3/uL (ref 150–400)
RBC: 2.58 MIL/uL — AB (ref 3.87–5.11)
RDW: 16.5 % — ABNORMAL HIGH (ref 11.5–15.5)
WBC: 14.6 10*3/uL — AB (ref 4.0–10.5)

## 2016-03-13 LAB — HEPARIN LEVEL (UNFRACTIONATED): Heparin Unfractionated: 0.3 IU/mL (ref 0.30–0.70)

## 2016-03-13 MED ORDER — WARFARIN SODIUM 4 MG PO TABS
4.0000 mg | ORAL_TABLET | Freq: Once | ORAL | Status: AC
  Administered 2016-03-13: 4 mg via ORAL
  Filled 2016-03-13: qty 1

## 2016-03-13 MED ORDER — SODIUM CHLORIDE 0.9 % IV SOLN
250.0000 mg | INTRAVENOUS | Status: AC
  Administered 2016-03-14 – 2016-03-19 (×4): 250 mg via INTRAVENOUS
  Filled 2016-03-13 (×6): qty 20

## 2016-03-13 MED ORDER — WARFARIN - PHARMACIST DOSING INPATIENT
Freq: Every day | Status: DC
  Administered 2016-03-18: 18:00:00

## 2016-03-13 NOTE — Progress Notes (Signed)
[facility] Progress Note  Dialysis Orders: [name] MWF 3h 62min 59.5kg Hep none L fem AVG Po calcitriol 0.55mcg /HD MIrcera 60 mcg q 2wks *was to start 03/05/16" Other op labs hgb 9.8 Ca 9.3 phos 8.7 PTH 1294  Assessment/Plan: 1. PAD/ L foot ischemia - s/p L thigh AVG ligation 1/30w/ right IJ cath insertion by VVS to hopefully improve wound healing on the left heel. 2. Fever / hypotension - ? Heel/ abtx per primary WBC 17 K, BC 1/25 1/2coag neg staph - BC 1/25 and 1/28 no growth; on Maxipime 3. ESRD - MWF - next HD Friday 4. Aflutter/ RVR/AVR on chronic coumadinI pharm following - on heparin 5. Chest Pain / CAD ho / AOR - card consulted "demand ischemia " 6. BP/Volume - net net UF Sunday 0 UF Monday 500. BP labile- at best has been getting to post HD of 60.5 at the out patient unit with relatively small gains; needs volume down per 1/28 CXR; Net UF 2 L on 1/31 with post wt not done - pre wt was 65.9 so post wt would approx be 63.9 7. Anemia - hgb 8.7>7.1 >8>6.2- no transfusionStarted ESA 1/31 was supposed to have started last week for outpt hgb of 9.8 but admitted here- tsat 12% with Fe 22 - replete Fe 250 x 4 8. 2HPTH - cont vit D/ phoslo 9. HO Liver transplant sec Hep c- on meds - T bili was 3 down to 1.3 yesterday 10. DM Type 2 - per admit  11. COPD 62. Nutrition - alb 1.7 ? Liver hx/malnutrition/chronic wounds- add prostat and supplements- liberalize diet to regular but intake still poor. Did have some fried chix last nite. 13. Left arm swelling- better today; not clear why - did get more volume off 14. Goals of care - seen by Palliative Care- appreciate help - multiple medical issues; DNR/doesn't want aggressive measures    Myriam Jacobson, PA-C [facility] [phone] 03/13/2016,9:09 AM  LOS: 7 days   Patient seen and examined. Agree w/ PA A/P Pt states that her left foot pain is somewhat improved today which would be consistent  with steal physiology. Now has a RIJ TC for HD Friday. Otelia Santee, MD [facility]  Subjective:   No new issues. Not much appetite. Has only frosted flakes/milk and fruit for breakfast and hasn't started eating. Tech getting her OOB to chair  Objective Vitals:   03/12/16 1030 03/12/16 1743 03/12/16 2059 03/13/16 0527  BP: 115/75 (!) 143/63 135/86 (!) 139/59  Pulse: (!) 102 96 95 96  Resp: 18 18 19 20   Temp:  98.2 F (36.8 C) 98.3 F (36.8 C) 98.4 F (36.9 C)  TempSrc:  Oral Oral Oral  SpO2:  100% 100% 98%  Weight:    65.7 kg (144 lb 13.5 oz)  Height:       Physical Exam General: NAD breathing easily supine in bed Heart:  RRR Lungs: no rales Abdomen: soft NT Extremities: right LE no edema in SCR - left no edema/heel dry; left arm swelling better today Dialysis Access: right IJ and ligated left thigh graft no bruit   Additional Objective Labs: Basic Metabolic Panel:  Recent Labs Lab 03/09/16 1940  03/11/16 0624 03/11/16 1821 03/12/16 0702  NA 134*  < > 135 133* 135  K 3.4*  < > 3.6 3.4* 3.4*  CL 97*  < > 99* 96* 98*  CO2 28  < > 26 30 30   GLUCOSE 106*  < >  66 99 64*  BUN 15  < > 7 10 14   CREATININE 4.49*  < > 3.62* 4.27* 5.06*  CALCIUM 8.0*  < > 9.1 8.5* 8.6*  PHOS 4.5  --   --  4.7* 4.9*  < > = values in this interval not displayed. Liver Function Tests:  Recent Labs Lab 03/07/16 0425  03/11/16 0624 03/11/16 1821 03/12/16 0702  AST 33  --   --   --  29  ALT 16  --   --   --  13*  ALKPHOS 314*  --   --   --  235*  BILITOT 3.0*  --   --   --  1.3*  PROT 5.8*  --   --   --  5.6*  ALBUMIN 1.9*  < > 2.0* 1.6* 1.7*  < > = values in this interval not displayed. No results for input(s): LIPASE, AMYLASE in the last 168 hours. CBC:  Recent Labs Lab 03/10/16 0150 03/11/16 0624 03/11/16 1821 03/12/16 0702 03/13/16 0551  WBC 20.5* 17.3* 11.8* 13.1* 14.6*  HGB 7.6* 8.7* 7.1* 8.0* 8.2*  HCT 23.3* 27.1* 21.7* 25.4* 25.4*  MCV 97.1 97.8 98.6 98.8 98.4  PLT 172 217  174 192 195   Blood Culture    Component Value Date/Time   SDES BLOOD RIGHT HAND 03/09/2016 1208   SPECREQUEST IN PEDIATRIC BOTTLE 2.5CC 03/09/2016 1208   CULT NO GROWTH 3 DAYS 03/09/2016 1208   REPTSTATUS PENDING 03/09/2016 1208    Cardiac Enzymes:  Recent Labs Lab 03/09/16 1024  TROPONINI 2.78*   CBG:  Recent Labs Lab 03/11/16 2223 03/12/16 1102 03/12/16 1605 03/12/16 2058 03/13/16 0733  GLUCAP 100* 113* 222* 125* 80   Iron Studies:  Recent Labs  03/12/16 1308  IRON 22*  TIBC 181*   Lab Results  Component Value Date   INR 1.69 03/10/2016   INR 1.39 03/07/2016   INR 1.81 03/05/2016   Studies/Results: Dg Chest Port 1 View  Result Date: 03/12/2016 CLINICAL DATA:  Check dialysis catheter positioning EXAM: PORTABLE CHEST 1 VIEW COMPARISON:  03/09/2016 FINDINGS: Cardiac shadow is enlarged. Postsurgical changes are again seen. A new right jugular dialysis catheter is noted in satisfactory position. Vascular congestion and bilateral effusions are again seen likely related to volume overload. No focal confluent infiltrate is seen. IMPRESSION: Dialysis catheter in satisfactory position. Changes of CHF and pleural effusions likely related to volume overload Electronically Signed   By: Inez Catalina M.D.   On: 03/12/2016 08:13   Dg Fluoro Guide Cv Line-no Report  Result Date: 03/11/2016 Fluoroscopy was utilized by the requesting physician.  No radiographic interpretation.   Medications: . sodium chloride 10 mL/hr at 03/11/16 1324  . dextrose 10 mL/hr at 03/10/16 0000  . heparin 1,450 Units/hr (03/13/16 0819)   . calcitRIOL  0.25 mcg Oral Q M,W,F-1800  . calcium acetate  667 mg Oral TID WC  . ceFEPime (MAXIPIME) IV  1 g Intravenous Q24H  . darbepoetin (ARANESP) injection - DIALYSIS  100 mcg Intravenous Q Wed-HD  . feeding supplement (NEPRO CARB STEADY)  237 mL Oral BID BM  . feeding supplement (PRO-STAT SUGAR FREE 64)  30 mL Oral BID  . gabapentin  300 mg Oral QPM   . insulin aspart  0-5 Units Subcutaneous QHS  . insulin aspart  0-9 Units Subcutaneous TID WC  . insulin detemir  5 Units Subcutaneous QHS  . levothyroxine  175 mcg Oral QAC breakfast  . multivitamin  1 tablet  Oral QHS  . senna-docusate  1 tablet Oral Daily  . sodium chloride flush  3 mL Intravenous Q12H  . tacrolimus  2 mg Oral BID

## 2016-03-13 NOTE — Progress Notes (Signed)
Physical Therapy Treatment Patient Details Name: [name] MRN: [id number] DOB: 1955-12-22 Today's Date: 03/13/2016    History of Present Illness  [name] is a 61 y.o. female with medical history significant end-stage renal disease on Tuesday Thursday Saturday dialysis, CAD with LIMA, AVR, liver transplant, hep C, COPD, diabetes, hypertension, SVT presents to the emergency Department chief complaint persistent/worsening left foot/leg pain and palpitations with chest pain. Initial evaluation reveals elevated troponin, leukocytosis and a flutter HR 180    PT Comments    Pain meds only reduce pain enough for pt to try participating in w/bearing activity, but not able to maintain participation for long.  Will continue exercise to improve strength.   Follow Up Recommendations  Home health PT;Supervision for mobility/OOB     Equipment Recommendations  None recommended by PT    Recommendations for Other Services       Precautions / Restrictions Precautions Precautions: Fall    Mobility  Bed Mobility Overal bed mobility: Needs Assistance Bed Mobility: Supine to Sit Rolling: Min guard         General bed mobility comments: slow with no assist needed and not effortful  Transfers Overall transfer level: Needs assistance Equipment used: Rolling walker (2 wheeled) Transfers: Sit to/from Stand Sit to Stand: Min assist;+2 safety/equipment         General transfer comment: cues for hand placement and assist to come forward more than up.  Ambulation/Gait Ambulation/Gait assistance: Min assist Ambulation Distance (Feet): 5 Feet Assistive device: Rolling walker (2 wheeled) Gait Pattern/deviations: Step-to pattern   Gait velocity interpretation: Below normal speed for age/gender General Gait Details: heavy use of RW to decrease w/bearing on the L LE, but still too painful to sustain long.   Stairs            Wheelchair Mobility    Modified Rankin (Stroke  Patients Only)       Balance     Sitting balance-Leahy Scale: Good       Standing balance-Leahy Scale: Poor Standing balance comment: reliant on the RW                    Cognition Arousal/Alertness: Awake/alert Behavior During Therapy: Flat affect;WFL for tasks assessed/performed Overall Cognitive Status: Within Functional Limits for tasks assessed                      Exercises General Exercises - Lower Extremity Heel Slides: AROM;Strengthening;Both;10 reps;Supine (graded resistance) Straight Leg Raises: AROM;Strengthening;Both;10 reps;Supine    General Comments        Pertinent Vitals/Pain Pain Assessment: 0-10 Pain Score: 7  Pain Location: L foot with gait Pain Descriptors / Indicators: Grimacing;Moaning;Sharp;Sore Pain Intervention(s): Monitored during session;Limited activity within patient's tolerance    Home Living                      Prior Function            PT Goals (current goals can now be found in the care plan section) Acute Rehab PT Goals Patient Stated Goal: be able to walk better and not have to ask so much of my daughter PT Goal Formulation: With patient Time For Goal Achievement: 03/25/16 Potential to Achieve Goals: Good Progress towards PT goals: Progressing toward goals    Frequency    Min 3X/week      PT Plan Current plan remains appropriate    Co-evaluation  End of Session   Activity Tolerance: Patient tolerated treatment well;Patient limited by pain Patient left: in chair;with bed alarm set;with chair alarm set     Time: [id number] PT Time Calculation (min) (ACUTE ONLY): 19 min  Charges:  $Gait Training: 8-22 mins                    G CodesTessie Fass Jessia Kief 03/13/2016, 12:19 PM 03/13/2016  [name], PT [phone]  (pager)

## 2016-03-13 NOTE — Progress Notes (Signed)
PROGRESS NOTE  ROSELLEN HILLESTAD  [id number] DOB: 02/28/1955 DOA: 03/05/2016 PCP: No PCP Per Patient Outpatient Specialists:  Subjective: Patient feels much better than yesterday, denies fever or chills. Has left upper extremity pain yesterday this improved, and Doppler is negative for clots. Needs to ambulate. 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 Presents to ED on 1/24 from HD with A.Flutter HR of 180. Patient reported left foot pain in which she is being followed by vascular. Upon arrival to ED patient reports chest pain and had pos troponins as well as a small left pleural effusion on chest xray. Cardiology was consulted and believed that elevated trops were due to ischemic demand in setting of a possible infection. During stay patient has been febrile, lactic acid 1.5, and pending blood cultures. Patient was admitted to step-down under triad. On 1/26 patient became increasingly lethargic and hypotensive. [facility] was consulted and patient transferred to ICU. Patient did not wish to be intubated on BiPAP. She was seen by vascular surgery plan for removal of left thigh graft and place new thigh graft and catheter. Initially patient and family refused  the procedure.  Assessment & Plan:   Active Problems:   Hypertensive heart disease   GERD   End stage renal disease on dialysis-TTS   CAD (coronary artery disease), native coronary artery   History of prosthetic aortic valve replacement   COPD (chronic obstructive pulmonary disease) (HCC)   S/P liver transplant (HCC)   Chronic hepatitis C without hepatic coma (HCC)   Elevated troponin   Claudication of left lower extremity (HCC)   Sepsis (HCC)   Hypotension   Goals of care, counseling/discussion   Palliative care by specialist   Chest pain/CAD -Patient has significant history of CAD, troponin was elevated. Cardiology was  consulted, initially nuclear Myoview was planned but canceled by cardiology. -At this time no additional workup is recommended. Continue when necessary morphine for pain  Coagulase negative staph bacteremia -1/2 Blood cultures from 1/26 showed coagulase-negative staph likely contaminant. -Repeat cultures from 1/28 is still NGTD. Already on vancomycin for HCAP.  Healthcare associated pneumonia -Patient diagnosed with healthcare associated pneumonia, started on vancomycin and cefepime.  -Continue supportive management with bronchodilators, mucolytics and oxygen as needed. -Today is day 7 of vancomycin and cefepime will discontinue.  Leukocytosis. -She has persistent leukocytosis was 17.2 on admission, went up to 20.5. -WBC is 14.6 today, antibiotics to be discontinued.  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 Continue Aranesp  Diabetes mellitus- blood glucose is stable, continue sliding scale insulin with NovoLog.  Chronic left lower extremity pain-patient was seen by vascular surgery, initially there was planned to remove the left thigh graft and replaced with high new left thigh graft and catheter. Patient and family earlier had refused the procedure, yesterday morning  patient told palliative  care that she wants to have the procedure.Vascular surgery was re consulted, plan to undergo ligation of left AV graft today.[name]  COPD- Stable. Continue duo nebs when necessary  ESRD on hemodialysis- followed by nephrology, on hemodialysis Tuesday Thursday and Saturday.  Chronic immunosuppression in the setting of liver transplantation/Hepatitis C- continue Prograf  Hypothyroidism-TSH 4.836, continue Synthroid.   DVT prophylaxis: Heparin drip, will restart Coumadin Code Status: DNR Family Communication:  Disposition Plan:  Diet: Diet regular Room service appropriate? Yes; Fluid consistency: Thin; Fluid restriction: 1200  mL Fluid  Consultants:    Nephrology  Procedures:   None  Antimicrobials:   Cefepime and vancomycin  Objective: Vitals:   03/12/16 1743 03/12/16 2059 03/13/16 0527 03/13/16 1000  BP: (!) 143/63 135/86 (!) 139/59 (!) 165/64  Pulse: 96 95 96 87  Resp: 18 19 20 20   Temp: 98.2 F (36.8 C) 98.3 F (36.8 C) 98.4 F (36.9 C) 98.6 F (37 C)  TempSrc: Oral Oral Oral Oral  SpO2: 100% 100% 98% 98%  Weight:   65.7 kg (144 lb 13.5 oz)   Height:        Intake/Output Summary (Last 24 hours) at 03/13/16 1144 Last data filed at 03/13/16 1000  Gross per 24 hour  Intake               60 ml  Output                0 ml  Net               60 ml   Filed Weights   03/11/16 2117 03/12/16 0645 03/13/16 0527  Weight: 64.9 kg (143 lb 1.6 oz) 65.9 kg (145 lb 4.5 oz) 65.7 kg (144 lb 13.5 oz)    Examination: General exam: Appears calm and comfortable  Respiratory system: Clear to auscultation. Respiratory effort normal. Cardiovascular system: S1 & S2 heard, RRR. No JVD, murmurs, rubs, gallops or clicks. No pedal edema. Gastrointestinal system: Abdomen is nondistended, soft and nontender. No organomegaly or masses felt. Normal bowel sounds heard. Central nervous system: Alert and oriented. No focal neurological deficits. Extremities: Symmetric 5 x 5 power. Skin: No rashes, lesions or ulcers Psychiatry: Judgement and insight appear normal. Mood & affect appropriate.   Data Reviewed: I have personally reviewed following labs and imaging studies  CBC:  Recent Labs Lab 03/10/16 0150 03/11/16 0624 03/11/16 1821 03/12/16 0702 03/13/16 0551  WBC 20.5* 17.3* 11.8* 13.1* 14.6*  HGB 7.6* 8.7* 7.1* 8.0* 8.2*  HCT 23.3* 27.1* 21.7* 25.4* 25.4*  MCV 97.1 97.8 98.6 98.8 98.4  PLT 172 217 174 192 0000000   Basic Metabolic Panel:  Recent Labs Lab 03/09/16 1940 03/10/16 0150 03/11/16 0624 03/11/16 1821 03/12/16 0702  NA 134* 134* 135 133* 135  K 3.4* 3.7 3.6 3.4* 3.4*  CL 97* 96* 99* 96* 98*  CO2 28 26 26 30 30    GLUCOSE 106* 107* 66 99 64*  BUN 15 16 7 10 14   CREATININE 4.49* 5.21* 3.62* 4.27* 5.06*  CALCIUM 8.0* 8.3* 9.1 8.5* 8.6*  PHOS 4.5  --   --  4.7* 4.9*   GFR: Estimated Creatinine Clearance: 10.9 mL/min (by C-G formula based on SCr of 5.06 mg/dL (H)). Liver Function Tests:  Recent Labs Lab 03/07/16 0425 03/09/16 1940 03/11/16 0624 03/11/16 1821 03/12/16 0702  AST 33  --   --   --  29  ALT 16  --   --   --  13*  ALKPHOS 314*  --   --   --  235*  BILITOT 3.0*  --   --   --  1.3*  PROT 5.8*  --   --   --  5.6*  ALBUMIN 1.9* 1.6* 2.0* 1.6* 1.7*   No results for input(s): LIPASE, AMYLASE in the last 168 hours. No results for input(s): AMMONIA in the last 168 hours. Coagulation Profile:  Recent Labs Lab 03/07/16 0852 03/10/16 0150  INR 1.39 1.69   Cardiac Enzymes:  Recent Labs Lab 03/09/16 1024  TROPONINI 2.78*   BNP (last 3 results) No results for input(s): PROBNP in the last 8760 hours. HbA1C: No results for input(s): HGBA1C in the last 72 hours. CBG:  Recent Labs Lab 03/11/16 2223 03/12/16 1102 03/12/16 1605 03/12/16 2058 03/13/16 0733  GLUCAP 100* 113* 222* 125* 80   Lipid Profile: No results for input(s): CHOL, HDL, LDLCALC, TRIG, CHOLHDL, LDLDIRECT in the last 72 hours. Thyroid Function Tests: No results for input(s): TSH, T4TOTAL, FREET4, T3FREE, THYROIDAB in the last 72 hours. Anemia Panel:  Recent Labs  03/12/16 1308  TIBC 181*  IRON 22*   Urine analysis:    Component Value Date/Time   COLORURINE YELLOW 11/02/2012 0428   APPEARANCEUR CLOUDY (A) 11/02/2012 0428   LABSPEC 1.011 11/02/2012 0428   PHURINE 8.0 11/02/2012 0428   GLUCOSEU 250 (A) 11/02/2012 0428   HGBUR LARGE (A) 11/02/2012 0428   BILIRUBINUR NEGATIVE 11/02/2012 0428   KETONESUR NEGATIVE 11/02/2012 0428   PROTEINUR >300 (A) 11/02/2012 0428   UROBILINOGEN 0.2 11/02/2012 0428   NITRITE NEGATIVE 11/02/2012 0428   LEUKOCYTESUR NEGATIVE 11/02/2012 0428   Sepsis  Labs: @LABRCNTIP (procalcitonin:4,lacticidven:4)  ) Recent Results (from the past 240 hour(s))  MRSA PCR Screening     Status: None   Collection Time: 03/05/16  6:37 PM  Result Value Ref Range Status   MRSA by PCR NEGATIVE NEGATIVE Final    Comment:        The GeneXpert MRSA Assay (FDA approved for NASAL specimens only), is one component of a comprehensive MRSA colonization surveillance program. It is not intended to diagnose MRSA infection nor to guide or monitor treatment for MRSA infections.   Culture, blood (routine x 2)     Status: None   Collection Time: 03/06/16  5:48 AM  Result Value Ref Range Status   Specimen Description BLOOD LEFT HAND  Final   Special Requests BOTTLES DRAWN AEROBIC AND ANAEROBIC 10CC EA  Final   Culture NO GROWTH 5 DAYS  Final   Report Status 03/11/2016 FINAL  Final  Culture, blood (routine x 2)     Status: None   Collection Time: 03/06/16  5:54 AM  Result Value Ref Range Status   Specimen Description BLOOD RIGHT HAND  Final   Special Requests BOTTLES DRAWN AEROBIC AND ANAEROBIC 10CC EA  Final   Culture NO GROWTH 5 DAYS  Final   Report Status 03/11/2016 FINAL  Final  Culture, blood (Routine X 2) w Reflex to ID Panel     Status: None   Collection Time: 03/07/16  8:52 AM  Result Value Ref Range Status   Specimen Description BLOOD LEFT HAND  Final   Special Requests BOTTLES DRAWN AEROBIC ONLY 5CC  Final   Culture NO GROWTH 5 DAYS  Final   Report Status 03/12/2016 FINAL  Final  Culture, blood (Routine X 2) w Reflex to ID Panel     Status: Abnormal   Collection Time: 03/07/16  8:52 AM  Result Value Ref Range Status   Specimen Description BLOOD RIGHT HAND  Final   Special Requests IN PEDIATRIC BOTTLE 3CC  Final   Culture  Setup Time   Final    GRAM POSITIVE COCCI IN CLUSTERS IN PEDIATRIC BOTTLE CRITICAL RESULT CALLED TO, READ BACK BY AND VERIFIED WITH: M MACCIA,PHARMD AT [id number] 03/08/16 BY L BENFIELD    Culture (A)  Final    STAPHYLOCOCCUS  SPECIES (COAGULASE NEGATIVE) THE SIGNIFICANCE OF ISOLATING THIS ORGANISM FROM A SINGLE SET OF BLOOD CULTURES WHEN MULTIPLE SETS  ARE DRAWN IS UNCERTAIN. PLEASE NOTIFY THE MICROBIOLOGY DEPARTMENT WITHIN ONE WEEK IF SPECIATION AND SENSITIVITIES ARE REQUIRED.    Report Status 03/10/2016 FINAL  Final  Blood Culture ID Panel (Reflexed)     Status: Abnormal   Collection Time: 03/07/16  8:52 AM  Result Value Ref Range Status   Enterococcus species NOT DETECTED NOT DETECTED Final   Listeria monocytogenes NOT DETECTED NOT DETECTED Final   Staphylococcus species DETECTED (A) NOT DETECTED Final    Comment: CRITICAL RESULT CALLED TO, READ BACK BY AND VERIFIED WITH: Ailene Rud AT [id number] 03/08/16 BY L BENFIELD    Staphylococcus aureus NOT DETECTED NOT DETECTED Final   Methicillin resistance NOT DETECTED NOT DETECTED Final   Streptococcus species NOT DETECTED NOT DETECTED Final   Streptococcus agalactiae NOT DETECTED NOT DETECTED Final   Streptococcus pneumoniae NOT DETECTED NOT DETECTED Final   Streptococcus pyogenes NOT DETECTED NOT DETECTED Final   Acinetobacter baumannii NOT DETECTED NOT DETECTED Final   Enterobacteriaceae species NOT DETECTED NOT DETECTED Final   Enterobacter cloacae complex NOT DETECTED NOT DETECTED Final   Escherichia coli NOT DETECTED NOT DETECTED Final   Klebsiella oxytoca NOT DETECTED NOT DETECTED Final   Klebsiella pneumoniae NOT DETECTED NOT DETECTED Final   Proteus species NOT DETECTED NOT DETECTED Final   Serratia marcescens NOT DETECTED NOT DETECTED Final   Haemophilus influenzae NOT DETECTED NOT DETECTED Final   Neisseria meningitidis NOT DETECTED NOT DETECTED Final   Pseudomonas aeruginosa NOT DETECTED NOT DETECTED Final   Candida albicans NOT DETECTED NOT DETECTED Final   Candida glabrata NOT DETECTED NOT DETECTED Final   Candida krusei NOT DETECTED NOT DETECTED Final   Candida parapsilosis NOT DETECTED NOT DETECTED Final   Candida tropicalis NOT DETECTED NOT  DETECTED Final  Respiratory Panel by PCR     Status: None   Collection Time: 03/08/16  9:45 AM  Result Value Ref Range Status   Adenovirus NOT DETECTED NOT DETECTED Final   Coronavirus 229E NOT DETECTED NOT DETECTED Final   Coronavirus HKU1 NOT DETECTED NOT DETECTED Final   Coronavirus NL63 NOT DETECTED NOT DETECTED Final   Coronavirus OC43 NOT DETECTED NOT DETECTED Final   Metapneumovirus NOT DETECTED NOT DETECTED Final   Rhinovirus / Enterovirus NOT DETECTED NOT DETECTED Final   Influenza A NOT DETECTED NOT DETECTED Final   Influenza B NOT DETECTED NOT DETECTED Final   Parainfluenza Virus 1 NOT DETECTED NOT DETECTED Final   Parainfluenza Virus 2 NOT DETECTED NOT DETECTED Final   Parainfluenza Virus 3 NOT DETECTED NOT DETECTED Final   Parainfluenza Virus 4 NOT DETECTED NOT DETECTED Final   Respiratory Syncytial Virus NOT DETECTED NOT DETECTED Final   Bordetella pertussis NOT DETECTED NOT DETECTED Final   Chlamydophila pneumoniae NOT DETECTED NOT DETECTED Final   Mycoplasma pneumoniae NOT DETECTED NOT DETECTED Final  Culture, blood (routine x 2)     Status: None (Preliminary result)   Collection Time: 03/09/16 11:59 AM  Result Value Ref Range Status   Specimen Description BLOOD HEMODIALYSIS CATHETER  Final   Special Requests BOTTLES DRAWN AEROBIC AND ANAEROBIC 10CC EAC  Final   Culture NO GROWTH 3 DAYS  Final   Report Status PENDING  Incomplete  Culture, blood (routine x 2)     Status: None (Preliminary result)   Collection Time: 03/09/16 12:08 PM  Result Value Ref Range Status   Specimen Description BLOOD RIGHT HAND  Final   Special Requests IN PEDIATRIC BOTTLE 2.5CC  Final   Culture NO  GROWTH 3 DAYS  Final   Report Status PENDING  Incomplete  Surgical pcr screen     Status: None   Collection Time: 03/10/16 11:11 PM  Result Value Ref Range Status   MRSA, PCR NEGATIVE NEGATIVE Final   Staphylococcus aureus NEGATIVE NEGATIVE Final    Comment:        The Xpert SA Assay  (FDA approved for NASAL specimens in patients over 33 years of age), is one component of a comprehensive surveillance program.  Test performance has been validated by [facility] for patients greater than or equal to 3 year old. It is not intended to diagnose infection nor to guide or monitor treatment.   MRSA PCR Screening     Status: None   Collection Time: 03/11/16  1:00 PM  Result Value Ref Range Status   MRSA by PCR NEGATIVE NEGATIVE Final    Comment:        The GeneXpert MRSA Assay (FDA approved for NASAL specimens only), is one component of a comprehensive MRSA colonization surveillance program. It is not intended to diagnose MRSA infection nor to guide or monitor treatment for MRSA infections.      Invalid input(s): PROCALCITONIN, LACTICACIDVEN   Radiology Studies: Dg Chest Port 1 View  Result Date: 03/12/2016 CLINICAL DATA:  Check dialysis catheter positioning EXAM: PORTABLE CHEST 1 VIEW COMPARISON:  03/09/2016 FINDINGS: Cardiac shadow is enlarged. Postsurgical changes are again seen. A new right jugular dialysis catheter is noted in satisfactory position. Vascular congestion and bilateral effusions are again seen likely related to volume overload. No focal confluent infiltrate is seen. IMPRESSION: Dialysis catheter in satisfactory position. Changes of CHF and pleural effusions likely related to volume overload Electronically Signed   By: Inez Catalina M.D.   On: 03/12/2016 08:13   Dg Fluoro Guide Cv Line-no Report  Result Date: 03/11/2016 Fluoroscopy was utilized by the requesting physician.  No radiographic interpretation.        Scheduled Meds: . calcitRIOL  0.25 mcg Oral Q M,W,F-1800  . calcium acetate  667 mg Oral TID WC  . ceFEPime (MAXIPIME) IV  1 g Intravenous Q24H  . darbepoetin (ARANESP) injection - DIALYSIS  100 mcg Intravenous Q Wed-HD  . feeding supplement (NEPRO CARB STEADY)  237 mL Oral BID BM  . feeding supplement (PRO-STAT SUGAR FREE 64)   30 mL Oral BID  . [START ON 03/14/2016] ferric gluconate (FERRLECIT/NULECIT) IV  250 mg Intravenous Q M,W,F-HD  . gabapentin  300 mg Oral QPM  . insulin aspart  0-5 Units Subcutaneous QHS  . insulin aspart  0-9 Units Subcutaneous TID WC  . insulin detemir  5 Units Subcutaneous QHS  . levothyroxine  175 mcg Oral QAC breakfast  . multivitamin  1 tablet Oral QHS  . senna-docusate  1 tablet Oral Daily  . sodium chloride flush  3 mL Intravenous Q12H  . tacrolimus  2 mg Oral BID   Continuous Infusions: . sodium chloride 10 mL/hr at 03/11/16 1324  . dextrose 10 mL/hr at 03/10/16 0000  . heparin 1,450 Units/hr (03/13/16 0819)     LOS: 7 days    Time spent: 35 minutes    Teri Diltz A, MD [facility] Pager [phone]  If 7PM-7AM, please contact night-coverage [email or web address] Password [email or web address] 03/13/2016, 11:44 AM

## 2016-03-13 NOTE — Progress Notes (Addendum)
ANTICOAGULATION CONSULT NOTE - Follow Up Consult  Pharmacy Consult for heparin Indication: mechanical AVR, atrial flutter  Allergies  Allergen Reactions  . Acetaminophen Other (See Comments)    Liver transplant recipient   . Codeine Itching  . Mirtazapine Other (See Comments)    hallucination  . Morphine Itching and Other (See Comments)    hallucinate    Patient Measurements: Height: 5\' 4"  (162.6 cm) Weight: 144 lb 13.5 oz (65.7 kg) IBW/kg (Calculated) : 54.7 Heparin Dosing Weight: 59 kg  Vital Signs: Temp: 98.4 [name] (36.9 C) (02/01 0527) Temp Source: Oral (02/01 0527) BP: 139/59 (02/01 0527) Pulse Rate: 96 (02/01 0527)  Labs:  Recent Labs  03/11/16 0624 03/11/16 1821 03/12/16 0702 03/12/16 0719 03/13/16 0551  HGB 8.7* 7.1* 8.0*  --  8.2*  HCT 27.1* 21.7* 25.4*  --  25.4*  PLT 217 174 192  --  195  HEPARINUNFRC 0.31  --   --  0.38 0.30  CREATININE 3.62* 4.27* 5.06*  --   --     Estimated Creatinine Clearance: 10.9 mL/min (by C-G formula based on SCr of 5.06 mg/dL (H)).  Assessment: 61 yo [name] on heparin bridge for atrial flutter and hx mechanical AVR.  Pt was on Coumadin PTA which was held for a inpatient procedure that was completed on 1/30. Home dose of warfarin 2mg  daily.  Heparin level remains therapeutic on 1400 units/hr, though is on low end of acceptable range at 0.3units/mL Hgb 8.2, platelets 195. Patient received Aranesp yesterday (it was given a day early). No bleeding noted.  Goal of Therapy:  Heparin level 0.3-0.7 units/ml Monitor platelets by anticoagulation protocol: Yes   Plan:  Increase heparin to 1450 units/hr Continue daily heparin level and CBC Follow-up plans for restarting oral anticoagulation  Argyle Gustafson D. Allyna Pittsley, PharmD, BCPS Clinical Pharmacist Pager: [phone] 03/13/2016 8:00 AM   ADDENDUM To restart warfarin tonight. INR was 1.69 on 1/29. Home dose warfarin was 2mg  daily.  Plan:  Warfarin 4mg  po x1 tonight Daily INR Heparin as  above  Anikka Marsan D. Reinhart Saulters, PharmD, BCPS Clinical Pharmacist Pager: [phone] 03/13/2016 11:52 AM

## 2016-03-13 NOTE — Progress Notes (Signed)
*  Preliminary Results* Left upper extremity venous duplex completed. Left upper extremity is negative for deep and superficial vein thrombosis.  03/13/2016 10:47 AM  Maudry Mayhew, BS, RVT, RDCS, RDMS

## 2016-03-14 LAB — CBC
HEMATOCRIT: 25 % — AB (ref 36.0–46.0)
Hemoglobin: 8.1 g/dL — ABNORMAL LOW (ref 12.0–15.0)
MCH: 31.5 pg (ref 26.0–34.0)
MCHC: 32.4 g/dL (ref 30.0–36.0)
MCV: 97.3 fL (ref 78.0–100.0)
Platelets: 229 10*3/uL (ref 150–400)
RBC: 2.57 MIL/uL — ABNORMAL LOW (ref 3.87–5.11)
RDW: 16.5 % — AB (ref 11.5–15.5)
WBC: 18.4 10*3/uL — AB (ref 4.0–10.5)

## 2016-03-14 LAB — CULTURE, BLOOD (ROUTINE X 2)
Culture: NO GROWTH
Culture: NO GROWTH

## 2016-03-14 LAB — RENAL FUNCTION PANEL
Albumin: 1.7 g/dL — ABNORMAL LOW (ref 3.5–5.0)
Anion gap: 9 (ref 5–15)
BUN: 27 mg/dL — ABNORMAL HIGH (ref 6–20)
CO2: 27 mmol/L (ref 22–32)
Calcium: 8.8 mg/dL — ABNORMAL LOW (ref 8.9–10.3)
Chloride: 96 mmol/L — ABNORMAL LOW (ref 101–111)
Creatinine, Ser: 5.16 mg/dL — ABNORMAL HIGH (ref 0.44–1.00)
GFR calc Af Amer: 9 mL/min — ABNORMAL LOW (ref >60)
GFR calc non Af Amer: 8 mL/min — ABNORMAL LOW (ref >60)
Glucose, Bld: 60 mg/dL — ABNORMAL LOW (ref 65–99)
Phosphorus: 4.2 mg/dL (ref 2.5–4.6)
Potassium: 4 mmol/L (ref 3.5–5.1)
Sodium: 132 mmol/L — ABNORMAL LOW (ref 135–145)

## 2016-03-14 LAB — GLUCOSE, CAPILLARY
GLUCOSE-CAPILLARY: 108 mg/dL — AB (ref 65–99)
GLUCOSE-CAPILLARY: 109 mg/dL — AB (ref 65–99)
Glucose-Capillary: 117 mg/dL — ABNORMAL HIGH (ref 65–99)

## 2016-03-14 LAB — PROTIME-INR
INR: 1.35
Prothrombin Time: 16.8 seconds — ABNORMAL HIGH (ref 11.4–15.2)

## 2016-03-14 LAB — HEPARIN LEVEL (UNFRACTIONATED): HEPARIN UNFRACTIONATED: 0.43 [IU]/mL (ref 0.30–0.70)

## 2016-03-14 MED ORDER — SODIUM CHLORIDE 0.9 % IV SOLN: 100.0000 mL | INTRAVENOUS | Status: DC | PRN

## 2016-03-14 MED ORDER — LIDOCAINE-PRILOCAINE 2.5-2.5 % EX CREA: 1.0000 "application " | TOPICAL_CREAM | CUTANEOUS | Status: DC | PRN

## 2016-03-14 MED ORDER — HEPARIN SODIUM (PORCINE) 1000 UNIT/ML DIALYSIS: 1000.0000 [IU] | INTRAMUSCULAR | Status: DC | PRN

## 2016-03-14 MED ORDER — HYDROMORPHONE HCL 1 MG/ML IJ SOLN
1.0000 mg | Freq: Once | INTRAMUSCULAR | Status: AC
  Administered 2016-03-14: 1 mg via INTRAVENOUS

## 2016-03-14 MED ORDER — PENTAFLUOROPROP-TETRAFLUOROETH EX AERO: 1.0000 "application " | INHALATION_SPRAY | CUTANEOUS | Status: DC | PRN

## 2016-03-14 MED ORDER — CALCITRIOL 0.25 MCG PO CAPS
ORAL_CAPSULE | ORAL | Status: AC
  Filled 2016-03-14: qty 1

## 2016-03-14 MED ORDER — LIDOCAINE HCL (PF) 1 % IJ SOLN: 5.0000 mL | INTRAMUSCULAR | Status: DC | PRN

## 2016-03-14 MED ORDER — WARFARIN SODIUM 4 MG PO TABS
4.0000 mg | ORAL_TABLET | Freq: Once | ORAL | Status: AC
  Administered 2016-03-14: 4 mg via ORAL
  Filled 2016-03-14: qty 1

## 2016-03-14 MED ORDER — HYDROMORPHONE HCL 1 MG/ML IJ SOLN
INTRAMUSCULAR | Status: AC
  Filled 2016-03-14: qty 1

## 2016-03-14 MED ORDER — MORPHINE SULFATE (PF) 4 MG/ML IV SOLN
INTRAVENOUS | Status: AC
  Filled 2016-03-14: qty 1

## 2016-03-14 MED ORDER — OXYCODONE HCL 5 MG PO TABS
ORAL_TABLET | ORAL | Status: AC
  Filled 2016-03-14: qty 1

## 2016-03-14 MED ORDER — ALTEPLASE 2 MG IJ SOLR: 2.0000 mg | Freq: Once | INTRAMUSCULAR | Status: DC | PRN

## 2016-03-14 NOTE — Progress Notes (Signed)
ANTICOAGULATION CONSULT NOTE - Follow Up Consult  Pharmacy Consult for heparin Indication: mechanical AVR, atrial flutter  Allergies  Allergen Reactions  . Acetaminophen Other (See Comments)    Liver transplant recipient   . Codeine Itching  . Mirtazapine Other (See Comments)    hallucination  . Morphine Itching and Other (See Comments)    hallucinate    Patient Measurements: Height: 5\' 4"  (162.6 cm) Weight: 140 lb 10.5 oz (63.8 kg) (Standing Weight) IBW/kg (Calculated) : 54.7 Heparin Dosing Weight: 59 kg  Vital Signs: Temp: 98.7 F (37.1 C) (02/02 0710) Temp Source: Oral (02/02 0710) BP: 143/47 (02/02 0730) Pulse Rate: 94 (02/02 0730)  Labs:  Recent Labs  03/11/16 1821 03/12/16 0702 03/12/16 0719 03/13/16 0551 03/14/16 0500 03/14/16 0600  HGB 7.1* 8.0*  --  8.2* 8.1*  --   HCT 21.7* 25.4*  --  25.4* 25.0*  --   PLT 174 192  --  195 229  --   LABPROT  --   --   --   --  16.8*  --   INR  --   --   --   --  1.35  --   HEPARINUNFRC  --   --  0.38 0.30 0.43  --   CREATININE 4.27* 5.06*  --   --   --  5.16*    Estimated Creatinine Clearance: 9.9 mL/min (by C-G formula based on SCr of 5.16 mg/dL (H)).  Assessment: 61 yo F on heparin bridge for atrial flutter and hx mechanical AVR.  Pt was on Coumadin PTA which was held for a inpatient procedure that was completed on 1/30. Home dose of warfarin 2mg  daily.  Heparin level remains therapeutic on 1450 units/hr. INR has dropped to 1.35 and warfarin was restarted yesterday. Hgb 8.1, platelets 229. Patient received Aranesp 1/31 (it was given a day early). No bleeding noted.  PTA Warfarin Dose: 2mg /d   Goal of Therapy:  INR 2-3 Heparin level 0.3-0.7 units/ml Monitor platelets by anticoagulation protocol: Yes   Plan:  Continue heparin 1450 units/hr Warfarin 4mg  tonight x1 Daily HL/INR/CBC Monitor s/sx of bleeding  Andrey Cota. Diona Foley, PharmD, BCPS Clinical Pharmacist [phone] 03/14/2016 8:34 AM

## 2016-03-14 NOTE — Progress Notes (Signed)
Requested [facility] PT order from Dr Hartford Poli

## 2016-03-14 NOTE — Progress Notes (Signed)
OT Cancellation Note  Patient Details Name: [name] MRN: [id number] DOB: November 16, 1955   Cancelled Treatment:    Reason Eval/Treat Not Completed: Patient at procedure or test/ unavailable (HD)  Parke Poisson B 03/14/2016, 7:39 AM   Jeri Modena   OTR/L Pager: [phone] Office: [phone] .

## 2016-03-14 NOTE — Progress Notes (Signed)
PROGRESS NOTE  [name]  [id number] DOB: 02-28-1955 DOA: 03/05/2016 PCP: No PCP Per Patient Outpatient Specialists:  Subjective: Complaining about severe pain in the left foot. See old she was having dialysis, extra dose of Dilaudid was given.  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 Presents to ED on 1/24 from HD with A.Flutter HR of 180. Patient reported left foot pain in which she is being followed by vascular. Upon arrival to ED patient reports chest pain and had pos troponins as well as a small left pleural effusion on chest xray. Cardiology was consulted and believed that elevated trops were due to ischemic demand in setting of a possible infection. During stay patient has been febrile, lactic acid 1.5, and pending blood cultures. Patient was admitted to step-down under triad. On 1/26 patient became increasingly lethargic and hypotensive. [facility] M was consulted and patient transferred to ICU. Patient did not wish to be intubated on BiPAP. She was seen by vascular surgery plan for removal of left thigh graft and place new thigh graft and catheter. Initially patient and family refused  the procedure.  Assessment & Plan:   Active Problems:   Hypertensive heart disease   GERD   End stage renal disease on dialysis-TTS   CAD (coronary artery disease), native coronary artery   History of prosthetic aortic valve replacement   COPD (chronic obstructive pulmonary disease) (HCC)   Chronic anticoagulation w/ coumadin, goal INR 2.5-3.0 w/ AVR   S/P liver transplant ([place])   Chronic hepatitis C without hepatic coma (HCC)   Elevated troponin   Claudication of left lower extremity (HCC)   Sepsis (HCC)   Hypotension   Goals of care, counseling/discussion   Palliative care by specialist   Chest pain/CAD -Patient has significant history of CAD, troponin was elevated. Cardiology was consulted,  initially nuclear Myoview was planned but canceled by cardiology. -At this time no additional workup is recommended. Continue when necessary morphine for pain  Coagulase negative staph bacteremia -1/2 Blood cultures from 1/26 showed coagulase-negative staph likely contaminant. -Repeat cultures from 1/28 is still NGTD. Already on vancomycin for HCAP.  Healthcare associated pneumonia -Patient diagnosed with healthcare associated pneumonia, started on vancomycin and cefepime.  -Continue supportive management with bronchodilators, mucolytics and oxygen as needed. -Today is day 7 of vancomycin and cefepime will discontinue.  Leukocytosis. -She has persistent leukocytosis was 17.2 on admission, went up to 20.5. -WBC is 14.6 today, antibiotics to be discontinued.  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 Continue Aranesp  Diabetes mellitus- blood glucose is stable, continue sliding scale insulin with NovoLog.  Chronic left lower extremity pain-patient was seen by vascular surgery, initially there was planned to remove the left thigh graft and replaced with high new left thigh graft and catheter. Patient and family earlier had refused the procedure, yesterday morning  patient told palliative  care that she wants to have the procedure.Vascular surgery was re consulted, plan to undergo ligation of left AV graft. She continues to have pain every now and then.  COPD- Stable. Continue duo nebs when necessary  ESRD on hemodialysis- followed by nephrology, on hemodialysis Tuesday Thursday and Saturday.  Chronic immunosuppression in the setting of liver transplantation/Hepatitis C- continue Prograf  Hypothyroidism-TSH 4.836, continue Synthroid.  Chronic anticoagulation -She is on Coumadin for mechanical aortic valve. -Currently she is on heparin drip, Coumadin restarted, INR is  1.35.  Status post liver transplant -On Prograf continued.  DVT prophylaxis: Heparin drip,  will restart Coumadin Code Status: DNR Family Communication:  Disposition Plan: Skilled nursing facility, barrier to discharge needs therapeutic INR. Diet: Diet regular Room service appropriate? Yes; Fluid consistency: Thin; Fluid restriction: 1200 mL Fluid  Consultants:   Nephrology  Procedures:   None  Antimicrobials:   Cefepime and vancomycin  Objective: Vitals:   03/14/16 0930 03/14/16 1000 03/14/16 1030 03/14/16 1101  BP: (!) 114/57 (!) 139/51 (!) 146/67 (!) 171/78  Pulse: 100 96 99 98  Resp:    19  Temp:    98.4 F (36.9 C)  TempSrc:    Oral  SpO2:    97%  Weight:      Height:        Intake/Output Summary (Last 24 hours) at 03/14/16 1312 Last data filed at 03/14/16 1101  Gross per 24 hour  Intake          1143.11 ml  Output             2000 ml  Net          -856.89 ml   Filed Weights   03/13/16 0527 03/13/16 2149 03/14/16 0710  Weight: 65.7 kg (144 lb 13.5 oz) 62.6 kg (137 lb 14.4 oz) 63.8 kg (140 lb 10.5 oz)    Examination: General exam: Appears calm and comfortable  Respiratory system: Clear to auscultation. Respiratory effort normal. Cardiovascular system: S1 & S2 heard, RRR. No JVD, murmurs, rubs, gallops or clicks. No pedal edema. Gastrointestinal system: Abdomen is nondistended, soft and nontender. No organomegaly or masses felt. Normal bowel sounds heard. Central nervous system: Alert and oriented. No focal neurological deficits. Extremities: Symmetric 5 x 5 power. Skin: No rashes, lesions or ulcers Psychiatry: Judgement and insight appear normal. Mood & affect appropriate.   Data Reviewed: I have personally reviewed following labs and imaging studies  CBC:  Recent Labs Lab 03/11/16 0624 03/11/16 1821 03/12/16 0702 03/13/16 0551 03/14/16 0500  WBC 17.3* 11.8* 13.1* 14.6* 18.4*  HGB 8.7* 7.1* 8.0* 8.2* 8.1*  HCT 27.1* 21.7* 25.4* 25.4* 25.0*  MCV 97.8 98.6 98.8 98.4 97.3  PLT 217 174 192 195 [id number]   Basic Metabolic Panel:  Recent  Labs Lab 03/09/16 1940 03/10/16 0150 03/11/16 0624 03/11/16 1821 03/12/16 0702 03/14/16 0600  NA 134* 134* 135 133* 135 132*  K 3.4* 3.7 3.6 3.4* 3.4* 4.0  CL 97* 96* 99* 96* 98* 96*  CO2 28 26 26 30 30 27   GLUCOSE 106* 107* 66 99 64* 60*  BUN 15 16 7 10 14  27*  CREATININE 4.49* 5.21* 3.62* 4.27* 5.06* 5.16*  CALCIUM 8.0* 8.3* 9.1 8.5* 8.6* 8.8*  PHOS 4.5  --   --  4.7* 4.9* 4.2   GFR: Estimated Creatinine Clearance: 9.9 mL/min (by C-G formula based on SCr of 5.16 mg/dL (H)). Liver Function Tests:  Recent Labs Lab 03/09/16 1940 03/11/16 0624 03/11/16 1821 03/12/16 0702 03/14/16 0600  AST  --   --   --  29  --   ALT  --   --   --  13*  --   ALKPHOS  --   --   --  235*  --   BILITOT  --   --   --  1.3*  --   PROT  --   --   --  5.6*  --   ALBUMIN 1.6* 2.0* 1.6* 1.7* 1.7*   No results for  input(s): LIPASE, AMYLASE in the last 168 hours. No results for input(s): AMMONIA in the last 168 hours. Coagulation Profile:  Recent Labs Lab 03/10/16 0150 03/14/16 0500  INR 1.69 1.35   Cardiac Enzymes:  Recent Labs Lab 03/09/16 1024  TROPONINI 2.78*   BNP (last 3 results) No results for input(s): PROBNP in the last 8760 hours. HbA1C: No results for input(s): HGBA1C in the last 72 hours. CBG:  Recent Labs Lab 03/13/16 0733 03/13/16 1146 03/13/16 1632 03/13/16 2143 03/14/16 1253  GLUCAP 80 90 92 185* 108*   Lipid Profile: No results for input(s): CHOL, HDL, LDLCALC, TRIG, CHOLHDL, LDLDIRECT in the last 72 hours. Thyroid Function Tests: No results for input(s): TSH, T4TOTAL, FREET4, T3FREE, THYROIDAB in the last 72 hours. Anemia Panel:  Recent Labs  03/12/16 1308  TIBC 181*  IRON 22*   Urine analysis:    Component Value Date/Time   COLORURINE YELLOW 11/02/2012 0428   APPEARANCEUR CLOUDY (A) 11/02/2012 0428   LABSPEC 1.011 11/02/2012 0428   PHURINE 8.0 11/02/2012 0428   GLUCOSEU 250 (A) 11/02/2012 0428   HGBUR LARGE (A) 11/02/2012 0428    BILIRUBINUR NEGATIVE 11/02/2012 0428   KETONESUR NEGATIVE 11/02/2012 0428   PROTEINUR >300 (A) 11/02/2012 0428   UROBILINOGEN 0.2 11/02/2012 0428   NITRITE NEGATIVE 11/02/2012 0428   LEUKOCYTESUR NEGATIVE 11/02/2012 0428   Sepsis Labs: @LABRCNTIP (procalcitonin:4,lacticidven:4)  ) Recent Results (from the past 240 hour(s))  MRSA PCR Screening     Status: None   Collection Time: 03/05/16  6:37 PM  Result Value Ref Range Status   MRSA by PCR NEGATIVE NEGATIVE Final    Comment:        The GeneXpert MRSA Assay (FDA approved for NASAL specimens only), is one component of a comprehensive MRSA colonization surveillance program. It is not intended to diagnose MRSA infection nor to guide or monitor treatment for MRSA infections.   Culture, blood (routine x 2)     Status: None   Collection Time: 03/06/16  5:48 AM  Result Value Ref Range Status   Specimen Description BLOOD LEFT HAND  Final   Special Requests BOTTLES DRAWN AEROBIC AND ANAEROBIC 10CC EA  Final   Culture NO GROWTH 5 DAYS  Final   Report Status 03/11/2016 FINAL  Final  Culture, blood (routine x 2)     Status: None   Collection Time: 03/06/16  5:54 AM  Result Value Ref Range Status   Specimen Description BLOOD RIGHT HAND  Final   Special Requests BOTTLES DRAWN AEROBIC AND ANAEROBIC 10CC EA  Final   Culture NO GROWTH 5 DAYS  Final   Report Status 03/11/2016 FINAL  Final  Culture, blood (Routine X 2) w Reflex to ID Panel     Status: None   Collection Time: 03/07/16  8:52 AM  Result Value Ref Range Status   Specimen Description BLOOD LEFT HAND  Final   Special Requests BOTTLES DRAWN AEROBIC ONLY 5CC  Final   Culture NO GROWTH 5 DAYS  Final   Report Status 03/12/2016 FINAL  Final  Culture, blood (Routine X 2) w Reflex to ID Panel     Status: Abnormal   Collection Time: 03/07/16  8:52 AM  Result Value Ref Range Status   Specimen Description BLOOD RIGHT HAND  Final   Special Requests IN PEDIATRIC BOTTLE 3CC  Final    Culture  Setup Time   Final    GRAM POSITIVE COCCI IN CLUSTERS IN PEDIATRIC BOTTLE CRITICAL RESULT CALLED TO, READ  BACK BY AND VERIFIED WITH: Ailene Rud AT [id number] 03/08/16 BY L BENFIELD    Culture (A)  Final    STAPHYLOCOCCUS SPECIES (COAGULASE NEGATIVE) THE SIGNIFICANCE OF ISOLATING THIS ORGANISM FROM A SINGLE SET OF BLOOD CULTURES WHEN MULTIPLE SETS ARE DRAWN IS UNCERTAIN. PLEASE NOTIFY THE MICROBIOLOGY DEPARTMENT WITHIN ONE WEEK IF SPECIATION AND SENSITIVITIES ARE REQUIRED.    Report Status 03/10/2016 FINAL  Final  Blood Culture ID Panel (Reflexed)     Status: Abnormal   Collection Time: 03/07/16  8:52 AM  Result Value Ref Range Status   Enterococcus species NOT DETECTED NOT DETECTED Final   Listeria monocytogenes NOT DETECTED NOT DETECTED Final   Staphylococcus species DETECTED (A) NOT DETECTED Final    Comment: CRITICAL RESULT CALLED TO, READ BACK BY AND VERIFIED WITH: Ailene Rud AT [id number] 03/08/16 BY L BENFIELD    Staphylococcus aureus NOT DETECTED NOT DETECTED Final   Methicillin resistance NOT DETECTED NOT DETECTED Final   Streptococcus species NOT DETECTED NOT DETECTED Final   Streptococcus agalactiae NOT DETECTED NOT DETECTED Final   Streptococcus pneumoniae NOT DETECTED NOT DETECTED Final   Streptococcus pyogenes NOT DETECTED NOT DETECTED Final   Acinetobacter baumannii NOT DETECTED NOT DETECTED Final   Enterobacteriaceae species NOT DETECTED NOT DETECTED Final   Enterobacter cloacae complex NOT DETECTED NOT DETECTED Final   Escherichia coli NOT DETECTED NOT DETECTED Final   Klebsiella oxytoca NOT DETECTED NOT DETECTED Final   Klebsiella pneumoniae NOT DETECTED NOT DETECTED Final   Proteus species NOT DETECTED NOT DETECTED Final   Serratia marcescens NOT DETECTED NOT DETECTED Final   Haemophilus influenzae NOT DETECTED NOT DETECTED Final   Neisseria meningitidis NOT DETECTED NOT DETECTED Final   Pseudomonas aeruginosa NOT DETECTED NOT DETECTED Final   Candida  albicans NOT DETECTED NOT DETECTED Final   Candida glabrata NOT DETECTED NOT DETECTED Final   Candida krusei NOT DETECTED NOT DETECTED Final   Candida parapsilosis NOT DETECTED NOT DETECTED Final   Candida tropicalis NOT DETECTED NOT DETECTED Final  Respiratory Panel by PCR     Status: None   Collection Time: 03/08/16  9:45 AM  Result Value Ref Range Status   Adenovirus NOT DETECTED NOT DETECTED Final   Coronavirus 229E NOT DETECTED NOT DETECTED Final   Coronavirus HKU1 NOT DETECTED NOT DETECTED Final   Coronavirus NL63 NOT DETECTED NOT DETECTED Final   Coronavirus OC43 NOT DETECTED NOT DETECTED Final   Metapneumovirus NOT DETECTED NOT DETECTED Final   Rhinovirus / Enterovirus NOT DETECTED NOT DETECTED Final   Influenza A NOT DETECTED NOT DETECTED Final   Influenza B NOT DETECTED NOT DETECTED Final   Parainfluenza Virus 1 NOT DETECTED NOT DETECTED Final   Parainfluenza Virus 2 NOT DETECTED NOT DETECTED Final   Parainfluenza Virus 3 NOT DETECTED NOT DETECTED Final   Parainfluenza Virus 4 NOT DETECTED NOT DETECTED Final   Respiratory Syncytial Virus NOT DETECTED NOT DETECTED Final   Bordetella pertussis NOT DETECTED NOT DETECTED Final   Chlamydophila pneumoniae NOT DETECTED NOT DETECTED Final   Mycoplasma pneumoniae NOT DETECTED NOT DETECTED Final  Culture, blood (routine x 2)     Status: None (Preliminary result)   Collection Time: 03/09/16 11:59 AM  Result Value Ref Range Status   Specimen Description BLOOD HEMODIALYSIS CATHETER  Final   Special Requests BOTTLES DRAWN AEROBIC AND ANAEROBIC 10CC EAC  Final   Culture NO GROWTH 4 DAYS  Final   Report Status PENDING  Incomplete  Culture, blood (routine x 2)  Status: None (Preliminary result)   Collection Time: 03/09/16 12:08 PM  Result Value Ref Range Status   Specimen Description BLOOD RIGHT HAND  Final   Special Requests IN PEDIATRIC BOTTLE 2.5CC  Final   Culture NO GROWTH 4 DAYS  Final   Report Status PENDING  Incomplete   Surgical pcr screen     Status: None   Collection Time: 03/10/16 11:11 PM  Result Value Ref Range Status   MRSA, PCR NEGATIVE NEGATIVE Final   Staphylococcus aureus NEGATIVE NEGATIVE Final    Comment:        The Xpert SA Assay (FDA approved for NASAL specimens in patients over 83 years of age), is one component of a comprehensive surveillance program.  Test performance has been validated by [facility] for patients greater than or equal to 24 year old. It is not intended to diagnose infection nor to guide or monitor treatment.   MRSA PCR Screening     Status: None   Collection Time: 03/11/16  1:00 PM  Result Value Ref Range Status   MRSA by PCR NEGATIVE NEGATIVE Final    Comment:        The GeneXpert MRSA Assay (FDA approved for NASAL specimens only), is one component of a comprehensive MRSA colonization surveillance program. It is not intended to diagnose MRSA infection nor to guide or monitor treatment for MRSA infections.      Invalid input(s): PROCALCITONIN, [place]   Radiology Studies: No results found.      Scheduled Meds: . calcitRIOL  0.25 mcg Oral Q M,W,F-1800  . calcium acetate  667 mg Oral TID WC  . darbepoetin (ARANESP) injection - DIALYSIS  100 mcg Intravenous Q Wed-HD  . feeding supplement (NEPRO CARB STEADY)  237 mL Oral BID BM  . feeding supplement (PRO-STAT SUGAR FREE 64)  30 mL Oral BID  . ferric gluconate (FERRLECIT/NULECIT) IV  250 mg Intravenous Q M,W,F-HD  . gabapentin  300 mg Oral QPM  . HYDROmorphone      . insulin aspart  0-5 Units Subcutaneous QHS  . insulin aspart  0-9 Units Subcutaneous TID WC  . insulin detemir  5 Units Subcutaneous QHS  . levothyroxine  175 mcg Oral QAC breakfast  . morphine      . multivitamin  1 tablet Oral QHS  . oxyCODONE      . senna-docusate  1 tablet Oral Daily  . sodium chloride flush  3 mL Intravenous Q12H  . tacrolimus  2 mg Oral BID  . warfarin  4 mg Oral ONCE-1800  . Warfarin -  Pharmacist Dosing Inpatient   Does not apply q1800   Continuous Infusions: . sodium chloride 10 mL/hr at 03/11/16 1324  . heparin 1,450 Units/hr (03/14/16 0834)     LOS: 8 days    Time spent: 35 minutes    Ege Muckey A, MD [facility] Pager [phone]  If 7PM-7AM, please contact night-coverage [email or web address] Password [email or web address] 03/14/2016, 1:12 PM

## 2016-03-14 NOTE — Care Management Note (Signed)
Case Management Note  Patient Details  Name: [name] MRN: [id number] Date of Birth: 1955/11/06  Subjective/Objective:                 Patient stated she lives at home alone, would like [facility] PT as recommended by inpatient PT eval. Pt states she has used [facility] in the past. CM made referral to [facility] clinical liaison [facility] for [facility] PT. CM requested [facility] PT order from attending.    Action/Plan:   Expected Discharge Date:                  Expected Discharge Plan:  [place]  In-House Referral:     Discharge planning Services  CM Consult  Post Acute Care Choice:  Home Health Choice offered to:  Patient  DME Arranged:    DME Agency:     HH Arranged:  PT HH Agency:  [facility]  Status of Service:  Completed, signed off  If discussed at [place] of Stay Meetings, dates discussed:    Additional Comments:  Carles Collet, RN 03/14/2016, 12:03 PM

## 2016-03-14 NOTE — Progress Notes (Signed)
[facility] Progress Note   Subjective: Seen on HD, 2L UF goal. TDC is working without issues. C/o pain in feet. No CP or dyspnea currently.    Objective Vitals:   03/14/16 0730 03/14/16 0800 03/14/16 0830 03/14/16 0900  BP: (!) 143/47 134/60 (!) 145/67 (!) 167/66  Pulse: 94 99 93 (!) 102  Resp:      Temp:      TempSrc:      SpO2:      Weight:      Height:       Physical Exam General: Frail appearing female, NAD. Heart: RRR; no murmur. Lungs: CTA anteriorly. Abdomen: soft, non-tender Extremities: 1+ LE edema, bandages on B feet Dialysis Access: TDC in R chest. L AVG has been ligated.  Additional Objective Labs: Basic Metabolic Panel:  Recent Labs Lab 03/11/16 1821 03/12/16 0702 03/14/16 0600  NA 133* 135 132*  K 3.4* 3.4* 4.0  CL 96* 98* 96*  CO2 30 30 27   GLUCOSE 99 64* 60*  BUN 10 14 27*  CREATININE 4.27* 5.06* 5.16*  CALCIUM 8.5* 8.6* 8.8*  PHOS 4.7* 4.9* 4.2   Liver Function Tests:  Recent Labs Lab 03/11/16 1821 03/12/16 0702 03/14/16 0600  AST  --  29  --   ALT  --  13*  --   ALKPHOS  --  235*  --   BILITOT  --  1.3*  --   PROT  --  5.6*  --   ALBUMIN 1.6* 1.7* 1.7*   CBC:  Recent Labs Lab 03/11/16 0624 03/11/16 1821 03/12/16 0702 03/13/16 0551 03/14/16 0500  WBC 17.3* 11.8* 13.1* 14.6* 18.4*  HGB 8.7* 7.1* 8.0* 8.2* 8.1*  HCT 27.1* 21.7* 25.4* 25.4* 25.0*  MCV 97.8 98.6 98.8 98.4 97.3  PLT 217 174 192 195 229   Blood Culture    Component Value Date/Time   SDES BLOOD RIGHT HAND 03/09/2016 1208   SPECREQUEST IN PEDIATRIC BOTTLE 2.5CC 03/09/2016 1208   CULT NO GROWTH 4 DAYS 03/09/2016 1208   REPTSTATUS PENDING 03/09/2016 1208    Cardiac Enzymes:  Recent Labs Lab 03/09/16 1024  TROPONINI 2.78*   CBG:  Recent Labs Lab 03/12/16 2058 03/13/16 0733 03/13/16 1146 03/13/16 1632 03/13/16 2143  GLUCAP 125* 80 90 92 185*   Iron Studies:  Recent Labs  03/12/16 1308  IRON 22*  TIBC 181*    Studies/Results: No results found. Medications: . sodium chloride 10 mL/hr at 03/11/16 1324  . heparin 1,450 Units/hr (03/14/16 0834)   . calcitRIOL      . calcitRIOL  0.25 mcg Oral Q M,W,F-1800  . calcium acetate  667 mg Oral TID WC  . darbepoetin (ARANESP) injection - DIALYSIS  100 mcg Intravenous Q Wed-HD  . feeding supplement (NEPRO CARB STEADY)  237 mL Oral BID BM  . feeding supplement (PRO-STAT SUGAR FREE 64)  30 mL Oral BID  . ferric gluconate (FERRLECIT/NULECIT) IV  250 mg Intravenous Q M,W,F-HD  . gabapentin  300 mg Oral QPM  . insulin aspart  0-5 Units Subcutaneous QHS  . insulin aspart  0-9 Units Subcutaneous TID WC  . insulin detemir  5 Units Subcutaneous QHS  . levothyroxine  175 mcg Oral QAC breakfast  . morphine      . multivitamin  1 tablet Oral QHS  . oxyCODONE      . senna-docusate  1 tablet Oral Daily  . sodium chloride flush  3 mL Intravenous Q12H  . tacrolimus  2 mg  Oral BID  . warfarin  4 mg Oral ONCE-1800  . Warfarin - Pharmacist Dosing Inpatient   Does not apply q1800   Dialysis Orders: [name] MWF 3h 73min 59.5kg Hep none L fem AVG Po calcitriol 0.31mcg /HD MIrcera 60 mcg q 2wks *was to start 03/05/16" Other op labs hgb 9.8 Ca 9.3 phos 8.7 PTH 1294  Assessment/Plan: 1. PAD/ L foot ischemia: s/pL thigh AVG ligation 1/30w/ right IJ cath insertion by VVS to hopefully improve wound healing on the left heel. Plan per vascular surgery. 2. Fever / hypotension: BCx 1/26 1 of 2 CoNS, likely contaminant. BCx 1/28 negative. S/p 7d course of Vanc/Cefepime. 3. ESRD: Continue MWF schedule. 4K bath with HD. 4. Aflutter/RVR/AVR: on heparin/warfarin, pharmacy dosing. 5. Chest Pain /CAD: Cardiology consulted, felt to be "demand ischemia " 6. BP/Volume : Above her outpt EDW per weights. CXR 1/31 with volume excess. UF as tolerated, trying to get back to her prior EDW. 7. Anemia: Hgb 8.1 (stable) with low tsat. On Aranesp weekly and IV iron. 8. 2HPTH:  Continue vit D/ phoslo. 9. Hx Liver transplant (d/t Hep C): Per primary. 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  11. COPD 12. Nutrition: Alb 1.7, on supplements. 13. Goals of care: seen by Palliative Care; multiple medical issues; DNR/doesn't want aggressive measures.    Veneta Penton, PA-C 03/14/2016, 9:14 AM  [facility] Pager: [phone]  Patient seen and examined.  Seen on HD at 935AM 3/2.25 bath qb qd 400/800 with UF of 2.5L tolerating 175/48 She just got Dilaudid 1mg  IV, also on Morphine and Oxycodone. She's arousable but certainly somnolent. No changes.  Less pain yesterday and certainly in much more pain today. Hopefully related to post op pain and not because of ischemia.  [name] Santee, MD [facility]

## 2016-03-15 LAB — CBC
HEMATOCRIT: 27.2 % — AB (ref 36.0–46.0)
HEMOGLOBIN: 9 g/dL — AB (ref 12.0–15.0)
MCH: 32.4 pg (ref 26.0–34.0)
MCHC: 33.1 g/dL (ref 30.0–36.0)
MCV: 97.8 fL (ref 78.0–100.0)
Platelets: 273 10*3/uL (ref 150–400)
RBC: 2.78 MIL/uL — ABNORMAL LOW (ref 3.87–5.11)
RDW: 16.9 % — AB (ref 11.5–15.5)
WBC: 19.3 10*3/uL — AB (ref 4.0–10.5)

## 2016-03-15 LAB — GLUCOSE, CAPILLARY
GLUCOSE-CAPILLARY: 99 mg/dL (ref 65–99)
GLUCOSE-CAPILLARY: 104 mg/dL — AB (ref 65–99)
Glucose-Capillary: 58 mg/dL — ABNORMAL LOW (ref 65–99)
Glucose-Capillary: 68 mg/dL (ref 65–99)
Glucose-Capillary: 124 mg/dL — ABNORMAL HIGH (ref 65–99)
Glucose-Capillary: 104 mg/dL — ABNORMAL HIGH (ref 65–99)

## 2016-03-15 LAB — PROTIME-INR
INR: 1.57
Prothrombin Time: 18.9 seconds — ABNORMAL HIGH (ref 11.4–15.2)

## 2016-03-15 LAB — HEPARIN LEVEL (UNFRACTIONATED)
HEPARIN UNFRACTIONATED: 0.5 [IU]/mL (ref 0.30–0.70)
Heparin Unfractionated: 0.15 IU/mL — ABNORMAL LOW (ref 0.30–0.70)

## 2016-03-15 MED ORDER — WARFARIN SODIUM 4 MG PO TABS
4.0000 mg | ORAL_TABLET | Freq: Once | ORAL | Status: AC
  Administered 2016-03-15: 4 mg via ORAL
  Filled 2016-03-15: qty 1

## 2016-03-15 NOTE — Progress Notes (Signed)
[facility] Progress Note   Subjective:  Seen in room. Denies CP or dyspnea. Still with L foot pain, ?maybe improved. Afebrile.  Objective Vitals:   03/14/16 1648 03/14/16 2122 03/15/16 0526 03/15/16 0900  BP: (!) 152/66 (!) 152/62 (!) 166/58 (!) 155/60  Pulse: 100 97 90 99  Resp: 17 18 19  (!) 8  Temp: 99 F (37.2 C) 98.2 F (36.8 C) 98.6 F (37 C) 98.5 F (36.9 C)  TempSrc: Oral Oral Oral Oral  SpO2: 96% 100% 100% 100%  Weight:  62.3 kg (137 lb 5.6 oz)    Height:       Physical Exam General: Frail appearing female, NAD. Heart: RRR, mechanical click murmur present Lungs: CTA anteriorly. Abdomen: soft, non-tender Extremities: No LE edema. Ulceration on L heel with tenderness. Dialysis Access: TDC in R chest. L AVG has been ligated.  Additional Objective Labs: Basic Metabolic Panel:  Recent Labs Lab 03/11/16 1821 03/12/16 0702 03/14/16 0600  NA 133* 135 132*  K 3.4* 3.4* 4.0  CL 96* 98* 96*  CO2 30 30 27   GLUCOSE 99 64* 60*  BUN 10 14 27*  CREATININE 4.27* 5.06* 5.16*  CALCIUM 8.5* 8.6* 8.8*  PHOS 4.7* 4.9* 4.2   Liver Function Tests:  Recent Labs Lab 03/11/16 1821 03/12/16 0702 03/14/16 0600  AST  --  29  --   ALT  --  13*  --   ALKPHOS  --  235*  --   BILITOT  --  1.3*  --   PROT  --  5.6*  --   ALBUMIN 1.6* 1.7* 1.7*   CBC:  Recent Labs Lab 03/11/16 1821 03/12/16 0702 03/13/16 0551 03/14/16 0500 03/15/16 0436  WBC 11.8* 13.1* 14.6* 18.4* 19.3*  HGB 7.1* 8.0* 8.2* 8.1* 9.0*  HCT 21.7* 25.4* 25.4* 25.0* 27.2*  MCV 98.6 98.8 98.4 97.3 97.8  PLT 174 192 195 229 273   Blood Culture    Component Value Date/Time   SDES BLOOD RIGHT HAND 03/09/2016 1208   SPECREQUEST IN PEDIATRIC BOTTLE 2.5CC 03/09/2016 1208   CULT NO GROWTH 5 DAYS 03/09/2016 1208   REPTSTATUS 03/14/2016 FINAL 03/09/2016 1208    Cardiac Enzymes:  Recent Labs Lab 03/09/16 1024  TROPONINI 2.78*   CBG:  Recent Labs Lab 03/14/16 1648 03/14/16 2122  03/15/16 0758 03/15/16 0828 03/15/16 0913  GLUCAP 117* 109* 58* 68 99   Iron Studies:  Recent Labs  03/12/16 1308  IRON 22*  TIBC 181*   Medications: . sodium chloride 10 mL/hr at 03/11/16 1324  . heparin 1,550 Units/hr (03/15/16 0749)   . calcitRIOL  0.25 mcg Oral Q M,W,F-1800  . calcium acetate  667 mg Oral TID WC  . darbepoetin (ARANESP) injection - DIALYSIS  100 mcg Intravenous Q Wed-HD  . feeding supplement (NEPRO CARB STEADY)  237 mL Oral BID BM  . feeding supplement (PRO-STAT SUGAR FREE 64)  30 mL Oral BID  . ferric gluconate (FERRLECIT/NULECIT) IV  250 mg Intravenous Q M,W,F-HD  . gabapentin  300 mg Oral QPM  . insulin aspart  0-5 Units Subcutaneous QHS  . insulin aspart  0-9 Units Subcutaneous TID WC  . insulin detemir  5 Units Subcutaneous QHS  . levothyroxine  175 mcg Oral QAC breakfast  . multivitamin  1 tablet Oral QHS  . senna-docusate  1 tablet Oral Daily  . sodium chloride flush  3 mL Intravenous Q12H  . tacrolimus  2 mg Oral BID  . warfarin  4 mg  Oral ONCE-1800  . Warfarin - Pharmacist Dosing Inpatient   Does not apply q1800    Dialysis Orders: [name] MWF 3h 33min 59.5kg Hep none L fem AVG Po calcitriol 0.1mcg /HD MIrcera 60 mcg q 2wks (was to start 03/05/16) Other op labs hgb 9.8 Ca 9.3 phos 8.7 PTH 1294  Assessment/Plan: 1. PAD/ L foot ischemia: s/pL thigh AVG ligation 1/30w/ right IJ cath insertion by VVS to hopefully improve wound healing on the left heel. Plan per vascular surgery. 2. Fever/?Pneumonia: BCx 1/26 1 of 2 CoNS, likely contaminant. BCx 1/28 negative. S/p 7d course of Vanc/Cefepime. WBC rising. 3. ESRD: Continue MWF schedule, next 2/5.  4. Aflutter/RVR/AVR: on heparin/warfarin, pharmacy dosing. 5. Chest Pain /CAD: Cardiology consulted, felt to be "demand ischemia." 6. BP/Volume : Above her outpt EDW per weights. CXR 1/31 with volume excess. UF as tolerated, trying to get back to her prior EDW. 7. Anemia: Hgb 9 with low tsat.  On Aranesp weekly and IV iron. 8. 2HPTH: Continue vit D/ phoslo. 9. Hx Liver transplant (d/t Hep C): Per primary. 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  11. COPD 12. Nutrition: Alb 1.7, on supplements. 13. Goals of care: seen by Palliative Care; multiple medical issues; DNR/doesn't want aggressive measures.   Veneta Penton, PA-C 03/15/2016, 10:21 AM  [facility] Pager: [phone]  Patient seen and examined. Agree with PA A/P Her leg/foot pain always seems better off dialysis. I wonder if the narc are being dialyzed out or could be the timing of administration. She will need a mid treatment dose of analgesics. No acute indication for HD. MWF  Otelia Santee, MD [facility]

## 2016-03-15 NOTE — Progress Notes (Signed)
PROGRESS NOTE  [name]  [id number] DOB: [birth year] DOA: 03/05/2016 PCP: No PCP Per Patient Outpatient Specialists:  Subjective: No changes clinically today, foot is painful but better than yesterday. INR is 1.5 (target INR is 2.5-3.5).  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 Presents to ED on 1/24 from HD with A.Flutter HR of 180. Patient reported left foot pain in which she is being followed by vascular. Upon arrival to ED patient reports chest pain and had pos troponins as well as a small left pleural effusion on chest xray. Cardiology was consulted and believed that elevated trops were due to ischemic demand in setting of a possible infection. During stay patient has been febrile, lactic acid 1.5, and pending blood cultures. Patient was admitted to step-down under [facility]. On 1/26 patient became increasingly lethargic and hypotensive. [facility] M was consulted and patient transferred to ICU. Patient did not wish to be intubated on BiPAP. She was seen by vascular surgery plan for removal of left thigh graft and place new thigh graft and catheter. Initially patient and family refused  the procedure.  Assessment & Plan:   Active Problems:   Hypertensive heart disease   GERD   End stage renal disease on dialysis-TTS   CAD (coronary artery disease), native coronary artery   History of prosthetic aortic valve replacement   COPD (chronic obstructive pulmonary disease) (HCC)   Chronic anticoagulation w/ coumadin, goal INR 2.5-3.0 w/ AVR   S/P liver transplant ([place])   Chronic hepatitis C without hepatic coma (HCC)   Elevated troponin   Claudication of left lower extremity (HCC)   Sepsis (HCC)   Hypotension   Goals of care, counseling/discussion   Palliative care by specialist   Chest pain/CAD -Patient has significant history of CAD, troponin was elevated. Cardiology was consulted, initially  nuclear Myoview was planned but canceled by cardiology. -At this time no additional workup is recommended. Continue when necessary morphine for pain  Coagulase negative staph bacteremia -1/2 Blood cultures from 1/26 showed coagulase-negative staph likely contaminant. -Repeat cultures from 1/28 is still NGTD. Already on vancomycin for HCAP.  Healthcare associated pneumonia -Patient diagnosed with healthcare associated pneumonia, started on vancomycin and cefepime.  -Continue supportive management with bronchodilators, mucolytics and oxygen as needed. -Today is day 7 of vancomycin and cefepime will discontinue.  Leukocytosis. -She has persistent leukocytosis was 17.2 on admission, went up to 20.5. -WBC is 14.6 today, antibiotics to be discontinued.  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 Continue Aranesp  Diabetes mellitus- blood glucose is stable, continue sliding scale insulin with NovoLog.  Chronic left lower extremity pain-patient was seen by vascular surgery, initially there was planned to remove the left thigh graft and replaced with high new left thigh graft and catheter. Patient and family earlier had refused the procedure, yesterday morning  patient told palliative  care that she wants to have the procedure.Vascular surgery was re consulted, plan to undergo ligation of left AV graft. She continues to have pain every now and then.  COPD- Stable. Continue duo nebs when necessary  ESRD on hemodialysis- followed by nephrology, on hemodialysis Tuesday Thursday and Saturday.  Chronic immunosuppression in the setting of liver transplantation/Hepatitis C- continue Prograf  Hypothyroidism-TSH 4.836, continue Synthroid.  Chronic anticoagulation -She is on Coumadin for mechanical aortic valve. -Currently she is on heparin drip, Coumadin restarted, INR is 1.57.  Status post liver transplant -On Prograf continued.  DVT prophylaxis: Heparin drip, will  restart Coumadin Code Status: DNR Family Communication:  Disposition Plan: Skilled nursing facility, barrier to discharge needs therapeutic INR. Diet: Diet regular Room service appropriate? Yes; Fluid consistency: Thin; Fluid restriction: 1200 mL Fluid  Consultants:   Nephrology  Procedures:   None  Antimicrobials:   Cefepime and vancomycin  Objective: Vitals:   03/14/16 1648 03/14/16 2122 03/15/16 0526 03/15/16 0900  BP: (!) 152/66 (!) 152/62 (!) 166/58 (!) 155/60  Pulse: 100 97 90 99  Resp: 17 18 19  (!) 8  Temp: 99 F (37.2 C) 98.2 F (36.8 C) 98.6 F (37 C) 98.5 F (36.9 C)  TempSrc: Oral Oral Oral Oral  SpO2: 96% 100% 100% 100%  Weight:  62.3 kg (137 lb 5.6 oz)    Height:        Intake/Output Summary (Last 24 hours) at 03/15/16 1249 Last data filed at 03/15/16 0959  Gross per 24 hour  Intake              654 ml  Output                0 ml  Net              654 ml   Filed Weights   03/13/16 2149 03/14/16 0710 03/14/16 2122  Weight: 62.6 kg (137 lb 14.4 oz) 63.8 kg (140 lb 10.5 oz) 62.3 kg (137 lb 5.6 oz)    Examination: General exam: Appears calm and comfortable  Respiratory system: Clear to auscultation. Respiratory effort normal. Cardiovascular system: S1 & S2 heard, RRR. No JVD, murmurs, rubs, gallops or clicks. No pedal edema. Gastrointestinal system: Abdomen is nondistended, soft and nontender. No organomegaly or masses felt. Normal bowel sounds heard. Central nervous system: Alert and oriented. No focal neurological deficits. Extremities: Symmetric 5 x 5 power. Skin: No rashes, lesions or ulcers Psychiatry: Judgement and insight appear normal. Mood & affect appropriate.   Data Reviewed: I have personally reviewed following labs and imaging studies  CBC:  Recent Labs Lab 03/11/16 1821 03/12/16 0702 03/13/16 0551 03/14/16 0500 03/15/16 0436  WBC 11.8* 13.1* 14.6* 18.4* 19.3*  HGB 7.1* 8.0* 8.2* 8.1* 9.0*  HCT 21.7* 25.4* 25.4* 25.0* 27.2*    MCV 98.6 98.8 98.4 97.3 97.8  PLT 174 192 195 229 [id number]   Basic Metabolic Panel:  Recent Labs Lab 03/09/16 1940 03/10/16 0150 03/11/16 0624 03/11/16 1821 03/12/16 0702 03/14/16 0600  NA 134* 134* 135 133* 135 132*  K 3.4* 3.7 3.6 3.4* 3.4* 4.0  CL 97* 96* 99* 96* 98* 96*  CO2 28 26 26 30 30 27   GLUCOSE 106* 107* 66 99 64* 60*  BUN 15 16 7 10 14  27*  CREATININE 4.49* 5.21* 3.62* 4.27* 5.06* 5.16*  CALCIUM 8.0* 8.3* 9.1 8.5* 8.6* 8.8*  PHOS 4.5  --   --  4.7* 4.9* 4.2   GFR: Estimated Creatinine Clearance: 9.9 mL/min (by C-G formula based on SCr of 5.16 mg/dL (H)). Liver Function Tests:  Recent Labs Lab 03/09/16 1940 03/11/16 0624 03/11/16 1821 03/12/16 0702 03/14/16 0600  AST  --   --   --  29  --   ALT  --   --   --  13*  --   ALKPHOS  --   --   --  235*  --   BILITOT  --   --   --  1.3*  --   PROT  --   --   --  5.6*  --   ALBUMIN 1.6* 2.0* 1.6* 1.7* 1.7*   No results for input(s): LIPASE, AMYLASE in the last 168 hours. No results for input(s): AMMONIA in the last 168 hours. Coagulation Profile:  Recent Labs Lab 03/10/16 0150 03/14/16 0500 03/15/16 0436  INR 1.69 1.35 1.57   Cardiac Enzymes:  Recent Labs Lab 03/09/16 1024  TROPONINI 2.78*   BNP (last 3 results) No results for input(s): PROBNP in the last 8760 hours. HbA1C: No results for input(s): HGBA1C in the last 72 hours. CBG:  Recent Labs Lab 03/14/16 2122 03/15/16 0758 03/15/16 0828 03/15/16 0913 03/15/16 1212  GLUCAP 109* 58* 68 99 104*   Lipid Profile: No results for input(s): CHOL, HDL, LDLCALC, TRIG, CHOLHDL, LDLDIRECT in the last 72 hours. Thyroid Function Tests: No results for input(s): TSH, T4TOTAL, FREET4, T3FREE, THYROIDAB in the last 72 hours. Anemia Panel:  Recent Labs  03/12/16 1308  TIBC 181*  IRON 22*   Urine analysis:    Component Value Date/Time   COLORURINE YELLOW 11/02/2012 0428   APPEARANCEUR CLOUDY (A) 11/02/2012 0428   LABSPEC 1.011 11/02/2012 0428    PHURINE 8.0 11/02/2012 0428   GLUCOSEU 250 (A) 11/02/2012 0428   HGBUR LARGE (A) 11/02/2012 0428   BILIRUBINUR NEGATIVE 11/02/2012 0428   KETONESUR NEGATIVE 11/02/2012 0428   PROTEINUR >300 (A) 11/02/2012 0428   UROBILINOGEN 0.2 11/02/2012 0428   NITRITE NEGATIVE 11/02/2012 0428   LEUKOCYTESUR NEGATIVE 11/02/2012 0428   Sepsis Labs: @LABRCNTIP (procalcitonin:4,lacticidven:4)  ) Recent Results (from the past 240 hour(s))  MRSA PCR Screening     Status: None   Collection Time: 03/05/16  6:37 PM  Result Value Ref Range Status   MRSA by PCR NEGATIVE NEGATIVE Final    Comment:        The GeneXpert MRSA Assay (FDA approved for NASAL specimens only), is one component of a comprehensive MRSA colonization surveillance program. It is not intended to diagnose MRSA infection nor to guide or monitor treatment for MRSA infections.   Culture, blood (routine x 2)     Status: None   Collection Time: 03/06/16  5:48 AM  Result Value Ref Range Status   Specimen Description BLOOD LEFT HAND  Final   Special Requests BOTTLES DRAWN AEROBIC AND ANAEROBIC 10CC EA  Final   Culture NO GROWTH 5 DAYS  Final   Report Status 03/11/2016 FINAL  Final  Culture, blood (routine x 2)     Status: None   Collection Time: 03/06/16  5:54 AM  Result Value Ref Range Status   Specimen Description BLOOD RIGHT HAND  Final   Special Requests BOTTLES DRAWN AEROBIC AND ANAEROBIC 10CC EA  Final   Culture NO GROWTH 5 DAYS  Final   Report Status 03/11/2016 FINAL  Final  Culture, blood (Routine X 2) w Reflex to ID Panel     Status: None   Collection Time: 03/07/16  8:52 AM  Result Value Ref Range Status   Specimen Description BLOOD LEFT HAND  Final   Special Requests BOTTLES DRAWN AEROBIC ONLY 5CC  Final   Culture NO GROWTH 5 DAYS  Final   Report Status 03/12/2016 FINAL  Final  Culture, blood (Routine X 2) w Reflex to ID Panel     Status: Abnormal   Collection Time: 03/07/16  8:52 AM  Result Value Ref Range Status    Specimen Description BLOOD RIGHT HAND  Final   Special Requests IN PEDIATRIC BOTTLE 3CC  Final   Culture  Setup Time  Final    GRAM POSITIVE COCCI IN CLUSTERS IN PEDIATRIC BOTTLE CRITICAL RESULT CALLED TO, READ BACK BY AND VERIFIED WITH: M MACCIA,PHARMD AT [id number] 03/08/16 BY L BENFIELD    Culture (A)  Final    STAPHYLOCOCCUS SPECIES (COAGULASE NEGATIVE) THE SIGNIFICANCE OF ISOLATING THIS ORGANISM FROM A SINGLE SET OF BLOOD CULTURES WHEN MULTIPLE SETS ARE DRAWN IS UNCERTAIN. PLEASE NOTIFY THE MICROBIOLOGY DEPARTMENT WITHIN ONE WEEK IF SPECIATION AND SENSITIVITIES ARE REQUIRED.    Report Status 03/10/2016 FINAL  Final  Blood Culture ID Panel (Reflexed)     Status: Abnormal   Collection Time: 03/07/16  8:52 AM  Result Value Ref Range Status   Enterococcus species NOT DETECTED NOT DETECTED Final   Listeria monocytogenes NOT DETECTED NOT DETECTED Final   Staphylococcus species DETECTED (A) NOT DETECTED Final    Comment: CRITICAL RESULT CALLED TO, READ BACK BY AND VERIFIED WITH: Ailene Rud AT [id number] 03/08/16 BY L BENFIELD    Staphylococcus aureus NOT DETECTED NOT DETECTED Final   Methicillin resistance NOT DETECTED NOT DETECTED Final   Streptococcus species NOT DETECTED NOT DETECTED Final   Streptococcus agalactiae NOT DETECTED NOT DETECTED Final   Streptococcus pneumoniae NOT DETECTED NOT DETECTED Final   Streptococcus pyogenes NOT DETECTED NOT DETECTED Final   Acinetobacter baumannii NOT DETECTED NOT DETECTED Final   Enterobacteriaceae species NOT DETECTED NOT DETECTED Final   Enterobacter cloacae complex NOT DETECTED NOT DETECTED Final   Escherichia coli NOT DETECTED NOT DETECTED Final   Klebsiella oxytoca NOT DETECTED NOT DETECTED Final   Klebsiella pneumoniae NOT DETECTED NOT DETECTED Final   Proteus species NOT DETECTED NOT DETECTED Final   Serratia marcescens NOT DETECTED NOT DETECTED Final   Haemophilus influenzae NOT DETECTED NOT DETECTED Final   Neisseria meningitidis NOT  DETECTED NOT DETECTED Final   Pseudomonas aeruginosa NOT DETECTED NOT DETECTED Final   Candida albicans NOT DETECTED NOT DETECTED Final   Candida glabrata NOT DETECTED NOT DETECTED Final   Candida krusei NOT DETECTED NOT DETECTED Final   Candida parapsilosis NOT DETECTED NOT DETECTED Final   Candida tropicalis NOT DETECTED NOT DETECTED Final  Respiratory Panel by PCR     Status: None   Collection Time: 03/08/16  9:45 AM  Result Value Ref Range Status   Adenovirus NOT DETECTED NOT DETECTED Final   Coronavirus 229E NOT DETECTED NOT DETECTED Final   Coronavirus HKU1 NOT DETECTED NOT DETECTED Final   Coronavirus NL63 NOT DETECTED NOT DETECTED Final   Coronavirus OC43 NOT DETECTED NOT DETECTED Final   Metapneumovirus NOT DETECTED NOT DETECTED Final   Rhinovirus / Enterovirus NOT DETECTED NOT DETECTED Final   Influenza A NOT DETECTED NOT DETECTED Final   Influenza B NOT DETECTED NOT DETECTED Final   Parainfluenza Virus 1 NOT DETECTED NOT DETECTED Final   Parainfluenza Virus 2 NOT DETECTED NOT DETECTED Final   Parainfluenza Virus 3 NOT DETECTED NOT DETECTED Final   Parainfluenza Virus 4 NOT DETECTED NOT DETECTED Final   Respiratory Syncytial Virus NOT DETECTED NOT DETECTED Final   Bordetella pertussis NOT DETECTED NOT DETECTED Final   Chlamydophila pneumoniae NOT DETECTED NOT DETECTED Final   Mycoplasma pneumoniae NOT DETECTED NOT DETECTED Final  Culture, blood (routine x 2)     Status: None   Collection Time: 03/09/16 11:59 AM  Result Value Ref Range Status   Specimen Description BLOOD HEMODIALYSIS CATHETER  Final   Special Requests BOTTLES DRAWN AEROBIC AND ANAEROBIC 10CC EAC  Final   Culture NO GROWTH 5 DAYS  Final   Report Status 03/14/2016 FINAL  Final  Culture, blood (routine x 2)     Status: None   Collection Time: 03/09/16 12:08 PM  Result Value Ref Range Status   Specimen Description BLOOD RIGHT HAND  Final   Special Requests IN PEDIATRIC BOTTLE 2.5CC  Final   Culture NO  GROWTH 5 DAYS  Final   Report Status 03/14/2016 FINAL  Final  Surgical pcr screen     Status: None   Collection Time: 03/10/16 11:11 PM  Result Value Ref Range Status   MRSA, PCR NEGATIVE NEGATIVE Final   Staphylococcus aureus NEGATIVE NEGATIVE Final    Comment:        The Xpert SA Assay (FDA approved for NASAL specimens in patients over 64 years of age), is one component of a comprehensive surveillance program.  Test performance has been validated by [facility] for patients greater than or equal to 55 year old. It is not intended to diagnose infection nor to guide or monitor treatment.   MRSA PCR Screening     Status: None   Collection Time: 03/11/16  1:00 PM  Result Value Ref Range Status   MRSA by PCR NEGATIVE NEGATIVE Final    Comment:        The GeneXpert MRSA Assay (FDA approved for NASAL specimens only), is one component of a comprehensive MRSA colonization surveillance program. It is not intended to diagnose MRSA infection nor to guide or monitor treatment for MRSA infections.      Invalid input(s): PROCALCITONIN, [place]   Radiology Studies: No results found.      Scheduled Meds: . calcitRIOL  0.25 mcg Oral Q M,W,F-1800  . calcium acetate  667 mg Oral TID WC  . darbepoetin (ARANESP) injection - DIALYSIS  100 mcg Intravenous Q Wed-HD  . feeding supplement (NEPRO CARB STEADY)  237 mL Oral BID BM  . feeding supplement (PRO-STAT SUGAR FREE 64)  30 mL Oral BID  . ferric gluconate (FERRLECIT/NULECIT) IV  250 mg Intravenous Q M,W,F-HD  . gabapentin  300 mg Oral QPM  . insulin aspart  0-5 Units Subcutaneous QHS  . insulin aspart  0-9 Units Subcutaneous TID WC  . insulin detemir  5 Units Subcutaneous QHS  . levothyroxine  175 mcg Oral QAC breakfast  . multivitamin  1 tablet Oral QHS  . senna-docusate  1 tablet Oral Daily  . sodium chloride flush  3 mL Intravenous Q12H  . tacrolimus  2 mg Oral BID  . warfarin  4 mg Oral ONCE-1800  . Warfarin -  Pharmacist Dosing Inpatient   Does not apply q1800   Continuous Infusions: . sodium chloride 10 mL/hr at 03/11/16 1324  . heparin 1,550 Units/hr (03/15/16 0749)     LOS: 9 days    Time spent: 35 minutes    Matilynn Dacey A, MD [facility] Pager [phone]  If 7PM-7AM, please contact night-coverage [email or web address] Password [facility] 03/15/2016, 12:49 PM

## 2016-03-15 NOTE — Progress Notes (Signed)
ANTICOAGULATION CONSULT NOTE - Follow Up Consult  Pharmacy Consult for Heparin Indication: mechanical AVR, atrial  Allergies  Allergen Reactions  . Acetaminophen Other (See Comments)    Liver transplant recipient   . Codeine Itching  . Mirtazapine Other (See Comments)    hallucination  . Morphine Itching and Other (See Comments)    hallucinate    Patient Measurements: Height: 5\' 4"  (162.6 cm) Weight: 137 lb 5.6 oz (62.3 kg) IBW/kg (Calculated) : 54.7 Heparin Dosing Weight: 59 kg  Vital Signs: Temp: 98.5 F (36.9 C) (02/03 0900) Temp Source: Oral (02/03 0900) BP: 155/60 (02/03 0900) Pulse Rate: 99 (02/03 0900)  Labs:  Recent Labs  03/13/16 0551 03/14/16 0500 03/14/16 0600 03/15/16 0436 03/15/16 1543  HGB 8.2* 8.1*  --  9.0*  --   HCT 25.4* 25.0*  --  27.2*  --   PLT 195 229  --  273  --   LABPROT  --  16.8*  --  18.9*  --   INR  --  1.35  --  1.57  --   HEPARINUNFRC 0.30 0.43  --  0.15* 0.50  CREATININE  --   --  5.16*  --   --     Estimated Creatinine Clearance: 9.9 mL/min (by C-G formula based on SCr of 5.16 mg/dL (H)).   Assessment: 61 yo female on heparin>warfarin bridge for atrial flutter and h/o mechanical AVR. Pt was on warfarin PTA which was held for procedure while inpatient initially. Procedure completed 1/30 and warfarin resumed. PTA dose warfarin 2 mg daily.   Today is D#3 warfarin to heparin overlap. Heparin level is therapeutic at 0.5 on 1550 units/hr. No bleeding noted.   Goal of Therapy:  Heparin level 0.3-0.7 units/ml Monitor platelets by anticoagulation protocol: Yes   Plan:  Continue heparin drip at 1550 units/hr Daily heparin level, INR and CBC Monitor s/sx bleeding   Renold Genta, PharmD, [facility] - [phone] 03/15/2016 4:57 PM

## 2016-03-15 NOTE — Progress Notes (Addendum)
ANTICOAGULATION CONSULT NOTE - Follow Up Consult  Pharmacy Consult for Heparin bridge to warfarin Indication: mechanical AVR, atrial  Allergies  Allergen Reactions  . Acetaminophen Other (See Comments)    Liver transplant recipient   . Codeine Itching  . Mirtazapine Other (See Comments)    hallucination  . Morphine Itching and Other (See Comments)    hallucinate    Patient Measurements: Height: 5\' 4"  (162.6 cm) Weight: 137 lb 5.6 oz (62.3 kg) IBW/kg (Calculated) : 54.7 Heparin Dosing Weight: 59 kg  Vital Signs: Temp: 98.6 F (37 C) (02/03 0526) Temp Source: Oral (02/03 0526) BP: 166/58 (02/03 0526) Pulse Rate: 90 (02/03 0526)  Labs:  Recent Labs  03/13/16 0551 03/14/16 0500 03/14/16 0600 03/15/16 0436  HGB 8.2* 8.1*  --  9.0*  HCT 25.4* 25.0*  --  27.2*  PLT 195 229  --  273  LABPROT  --  16.8*  --  18.9*  INR  --  1.35  --  1.57  HEPARINUNFRC 0.30 0.43  --  0.15*  CREATININE  --   --  5.16*  --     Estimated Creatinine Clearance: 9.9 mL/min (by C-G formula based on SCr of 5.16 mg/dL (H)).   Assessment: 61 yo female on heparin>warfari8n bridge for atrial flutter and h/o mechanical AVR. Pt was on warfarin PTA which was held for procedure while inpatient initially. Procedure completed 1/30 and warfarin resumed. PTA dose warfarin 2 mg daily.   Today is D#3 warfarin heparin overlap. Heparin level previously therapeutic on 1400-1450 units/hr, now subtherapeutic at 0.15. Per RN - no interruptions in infusion overnight. Because of history of therapeutic heparin x several days, conservatively approaching dose adjustment. No bleeding noted per RN. CBC stable.   Warfarin 4 mg given x 2 days. INR remains subtherapeutic, as expected. Today INR up to 1.57.   Goal of Therapy:  Heparin level 0.3-0.7 units/ml INR goal 2.5 - 3 per outpatient anticoagulation notes Monitor platelets by anticoagulation protocol: Yes   Plan:  Increase heparin to 1550 units/hr Recheck heparin  level at 1600 Warfarin 4 mg PO x1 Daily heparin level, INR and CBC Monitor s/sx bleeding, DDI, PO intake  Carlean Jews, Pharm.D. PGY1 Pharmacy Resident 2/3/20187:56 AM Pager [phone]

## 2016-03-16 LAB — CBC
HCT: 25.1 % — ABNORMAL LOW (ref 36.0–46.0)
HEMOGLOBIN: 8 g/dL — AB (ref 12.0–15.0)
MCH: 31 pg (ref 26.0–34.0)
MCHC: 31.9 g/dL (ref 30.0–36.0)
MCV: 97.3 fL (ref 78.0–100.0)
Platelets: 260 10*3/uL (ref 150–400)
RBC: 2.58 MIL/uL — ABNORMAL LOW (ref 3.87–5.11)
RDW: 16.8 % — AB (ref 11.5–15.5)
WBC: 17.5 10*3/uL — ABNORMAL HIGH (ref 4.0–10.5)

## 2016-03-16 LAB — GLUCOSE, CAPILLARY
GLUCOSE-CAPILLARY: 120 mg/dL — AB (ref 65–99)
GLUCOSE-CAPILLARY: 122 mg/dL — AB (ref 65–99)
Glucose-Capillary: 61 mg/dL — ABNORMAL LOW (ref 65–99)
Glucose-Capillary: 156 mg/dL — ABNORMAL HIGH (ref 65–99)
Glucose-Capillary: 113 mg/dL — ABNORMAL HIGH (ref 65–99)

## 2016-03-16 LAB — PROTIME-INR
INR: 1.91
PROTHROMBIN TIME: 22.1 s — AB (ref 11.4–15.2)

## 2016-03-16 LAB — HEPARIN LEVEL (UNFRACTIONATED): Heparin Unfractionated: 0.37 IU/mL (ref 0.30–0.70)

## 2016-03-16 MED ORDER — POLYETHYLENE GLYCOL 3350 17 G PO PACK
17.0000 g | PACK | Freq: Every day | ORAL | Status: AC
  Administered 2016-03-16 (×2): 17 g via ORAL
  Filled 2016-03-16 (×2): qty 1

## 2016-03-16 MED ORDER — POLYETHYLENE GLYCOL 3350 17 G PO PACK
17.0000 g | PACK | Freq: Every day | ORAL | Status: AC
  Administered 2016-03-18: 17 g via ORAL
  Filled 2016-03-16: qty 1

## 2016-03-16 MED ORDER — BISACODYL 10 MG RE SUPP
10.0000 mg | Freq: Once | RECTAL | Status: AC
  Administered 2016-03-16: 10 mg via RECTAL
  Filled 2016-03-16: qty 1

## 2016-03-16 MED ORDER — WARFARIN SODIUM 4 MG PO TABS
4.0000 mg | ORAL_TABLET | Freq: Once | ORAL | Status: AC
  Administered 2016-03-16: 4 mg via ORAL
  Filled 2016-03-16: qty 1

## 2016-03-16 MED ORDER — SORBITOL 70 % SOLN
60.0000 mL | Freq: Once | Status: AC
  Administered 2016-03-16: 60 mL via ORAL
  Filled 2016-03-16: qty 60

## 2016-03-16 MED ORDER — DEXTROSE 50 % IV SOLN
INTRAVENOUS | Status: AC
  Administered 2016-03-16: 08:00:00
  Filled 2016-03-16: qty 50

## 2016-03-16 MED ORDER — POLYETHYLENE GLYCOL 3350 17 G PO PACK: 17.0000 g | PACK | Freq: Every day | ORAL | Status: DC

## 2016-03-16 MED ORDER — SORBITOL 70 % SOLN
30.0000 mL | Freq: Every day | Status: DC | PRN
  Filled 2016-03-16: qty 30

## 2016-03-16 NOTE — Progress Notes (Signed)
Hypoglycemic Event  CBG:  68  Treatment: 8 oz apple juice plus breakfast  Symptoms:  None  Follow-up CBG: Time: 0913 CBG Result: 99 Possible Reasons for Event: Poor food intake,refusing supplement  Comments/MD notified: yes    [place], [name]

## 2016-03-16 NOTE — Progress Notes (Signed)
Small rounded hard stool after laxative given.

## 2016-03-16 NOTE — Progress Notes (Signed)
ANTICOAGULATION CONSULT NOTE - Follow Up Consult  Pharmacy Consult for Heparin Indication: mechanical AVR, atrial  Allergies  Allergen Reactions  . Acetaminophen Other (See Comments)    Liver transplant recipient   . Codeine Itching  . Mirtazapine Other (See Comments)    hallucination  . Morphine Itching and Other (See Comments)    hallucinate    Patient Measurements: Height: 5\' 4"  (162.6 cm) Weight: 139 lb 5.3 oz (63.2 kg) IBW/kg (Calculated) : 54.7 Heparin Dosing Weight: 59 kg  Vital Signs: Temp: 98.5 F (36.9 C) (02/04 0900) Temp Source: Oral (02/04 0900) BP: 155/62 (02/04 0900) Pulse Rate: 95 (02/04 0900)  Labs:  Recent Labs  03/14/16 0500 03/14/16 0600 03/15/16 0436 03/15/16 1543 03/16/16 0450  HGB 8.1*  --  9.0*  --  8.0*  HCT 25.0*  --  27.2*  --  25.1*  PLT 229  --  273  --  260  LABPROT 16.8*  --  18.9*  --  22.1*  INR 1.35  --  1.57  --  1.91  HEPARINUNFRC 0.43  --  0.15* 0.50 0.37  CREATININE  --  5.16*  --   --   --     Estimated Creatinine Clearance: 9.9 mL/min (by C-G formula based on SCr of 5.16 mg/dL (H)).   Assessment: 61 yo female on heparin>warfarin bridge for atrial flutter and h/o mechanical AVR. Pt was on warfarin PTA which was held for procedure while inpatient initially. Procedure completed 1/30 and warfarin resumed. PTA dose warfarin 2 mg daily.   Today is D#4 warfarin to heparin overlap. Heparin level is therapeutic x2 on 1550 units/hr.  Warfarin 4 mg given x 3 days. INR remains subtherapeutic, as expected. Today INR up to 1.91. No bleeding noted.   Goal of Therapy:  INR goal 2.5 - 3 per outpatient anticoagulation notes Heparin level 0.3-0.7 units/ml Monitor platelets by anticoagulation protocol: Yes   Plan:  Continue heparin drip at 1550 units/hr Warfarin 4 mg x1 tonight (would like to return to PTA dose soon) Daily heparin level, INR and CBC Monitor s/sx bleeding  Carlean Jews, Pharm.D. PGY1 Pharmacy  Resident 2/4/201811:00 AM Pager [phone]

## 2016-03-16 NOTE — Evaluation (Signed)
Occupational Therapy Evaluation Patient Details Name: [name]: [id number] DOB: 10-Feb-1956 Today's Date: 03/16/2016    History of Present Illness  [name] is a 61 y.o. female with medical history significant end-stage renal disease on Tuesday Thursday Saturday dialysis, CAD with LIMA, AVR, liver transplant, hep C, COPD, diabetes, hypertension, SVT presents to the emergency Department chief complaint persistent/worsening left foot/leg pain and palpitations with chest pain. Initial evaluation reveals elevated troponin, leukocytosis and a flutter HR 180   Clinical Impression   Pt reports she was independent with BADL PTA. Currently pt overall min assist for stand pivot transfers, min guard for seated UB ADL, and mod assist for LB ADL. Pt with LE buckling and generalized weakness putting her at a high fall risk. Pt planning to d/c home with intermittent supervision from family. Recommending HHOT for follow up to maximize independence and safety with ADL and functional mobility. Pt would benefit from continued skilled OT to address established goals.    Follow Up Recommendations  Home health OT;Supervision/Assistance - 24 hour    Equipment Recommendations  Tub/shower bench    Recommendations for Other Services       Precautions / Restrictions Precautions Precautions: Fall Restrictions Weight Bearing Restrictions: No      Mobility Bed Mobility Overal bed mobility: Needs Assistance Bed Mobility: Sidelying to Sit   Sidelying to sit: Min guard       General bed mobility comments: Pt able to bring LEs off EOB, min guard for trunk elevation. HOB flat with use of bed rail  Transfers Overall transfer level: Needs assistance Equipment used: Rolling walker (2 wheeled) Transfers: Sit to/from [facility] Sit to Stand: Mod assist Stand pivot transfers: Min assist       General transfer comment: Assist to boost up from EOB x2, min assist for stand  pivot to [facility] due to urgency.    Balance Overall balance assessment: Needs assistance Sitting-balance support: Feet supported;No upper extremity supported Sitting balance-Leahy Scale: Good     Standing balance support: Bilateral upper extremity supported Standing balance-Leahy Scale: Poor                              ADL Overall ADL's : Needs assistance/impaired Eating/Feeding: Set up;Sitting   Grooming: Min guard;Sitting   Upper Body Bathing: Min guard;Sitting   Lower Body Bathing: Moderate assistance;Sit to/from stand   Upper Body Dressing : Min guard;Sitting   Lower Body Dressing: Moderate assistance;Sit to/from stand   Toilet Transfer: Minimal assistance;Stand-pivot;BSC   Toileting- Clothing Manipulation and Hygiene: Maximal assistance;Sit to/from stand Toileting - Clothing Manipulation Details (indicate cue type and reason): for peri care     Functional mobility during ADLs: Minimal assistance;Rolling walker (for stand pivot)       Vision Vision Assessment?: No apparent visual deficits   Perception     Praxis      Pertinent Vitals/Pain Pain Assessment: Faces Faces Pain Scale: Hurts little more Pain Location: L foot Pain Descriptors / Indicators: Sore;Discomfort Pain Intervention(s): Monitored during session     Hand Dominance Right   Extremity/Trunk Assessment Upper Extremity Assessment Upper Extremity Assessment: Generalized weakness   Lower Extremity Assessment Lower Extremity Assessment: Defer to PT evaluation   Cervical / Trunk Assessment Cervical / Trunk Assessment: Normal   Communication Communication Communication: No difficulties   Cognition Arousal/Alertness: Awake/alert Behavior During Therapy: WFL for tasks assessed/performed;Flat affect Overall Cognitive Status: Within Functional Limits for tasks  assessed                     General Comments       Exercises       Shoulder Instructions      Home Living  Family/patient expects to be discharged to:: Private residence Living Arrangements: Children Available Help at Discharge: Family;Available PRN/intermittently (daughter works 3rd shift) Type of Home: Mobile home Home Access: Stairs to enter Technical brewer of Steps: 5 Entrance Stairs-Rails: Right;Left;Can reach both Home Layout: One level     Bathroom Shower/Tub: Tub/shower unit Shower/tub characteristics: Architectural technologist: Handicapped height Bathroom Accessibility: No   Home Equipment: Bedside commode;Walker - 2 wheels          Prior Functioning/Environment Level of Independence: Needs assistance  Gait / Transfers Assistance Needed: states she uses rollator              OT Problem List: Decreased strength;Decreased activity tolerance;Impaired balance (sitting and/or standing);Decreased knowledge of use of DME or AE;Pain   OT Treatment/Interventions: Self-care/ADL training;Therapeutic exercise;Energy conservation;DME and/or AE instruction;Therapeutic activities;Patient/family education;Balance training    OT Goals(Current goals can be found in the care plan section) Acute Rehab OT Goals Patient Stated Goal: return home OT Goal Formulation: With patient Time For Goal Achievement: 03/30/16 Potential to Achieve Goals: Good ADL Goals Pt Will Perform Upper Body Bathing: with set-up;sitting Pt Will Perform Lower Body Bathing: with supervision;sit to/from stand Pt Will Perform Lower Body Dressing: with supervision;sit to/from stand Pt Will Transfer to Toilet: with supervision;ambulating;bedside commode (over toilet) Pt Will Perform Toileting - Clothing Manipulation and hygiene: with supervision;sit to/from stand  OT Frequency: Min 2X/week   Barriers to D/C: Decreased caregiver support;Inaccessible home environment  steps into home, daughter works at night       Co-evaluation              End of [place] During Treatment: Location manager Communication: Mobility status  Activity Tolerance: Patient tolerated treatment well Patient left: in bed;with call bell/phone within reach   Time: 1445-1513 OT Time Calculation (min): 28 min Charges:  OT General Charges $OT Visit: 1 Procedure OT Evaluation $OT Eval Moderate Complexity: 1 Procedure OT Treatments $Self Care/Home Management : 8-22 mins G-Codes:      Binnie Kand M.S., OTR/L Pager: [phone]  03/16/2016, 3:14 PM

## 2016-03-16 NOTE — Progress Notes (Signed)
[facility] Progress Note   Subjective:  Seen in room. No CP or dyspnea, no foot pain currently. C/o constipation, used laxative last night without relief.  Objective Vitals:   03/15/16 0900 03/15/16 1704 03/15/16 2100 03/16/16 0512  BP: (!) 155/60 (!) 142/62 (!) 146/61 (!) 165/73  Pulse: 99 90 92 90  Resp: 18 17 18 18   Temp: 98.5 F (36.9 C) 99.5 F (37.5 C) 98.7 F (37.1 C) 98.5 F (36.9 C)  TempSrc: Oral Oral Oral Oral  SpO2: 100% 98% 99% 100%  Weight:   63.2 kg (139 lb 5.3 oz)   Height:       Physical Exam General: Frail appearing female, NAD. Heart: RRR, mechanical click murmur present Lungs: CTA anteriorly. Abdomen: soft, non-tender Extremities: No LE edema. Ulceration on L heel with tenderness. Dialysis Access: TDC in R chest. L AVG has been ligated.  Additional Objective Labs: Basic Metabolic Panel:  Recent Labs Lab 03/11/16 1821 03/12/16 0702 03/14/16 0600  NA 133* 135 132*  K 3.4* 3.4* 4.0  CL 96* 98* 96*  CO2 30 30 27   GLUCOSE 99 64* 60*  BUN 10 14 27*  CREATININE 4.27* 5.06* 5.16*  CALCIUM 8.5* 8.6* 8.8*  PHOS 4.7* 4.9* 4.2   Liver Function Tests:  Recent Labs Lab 03/11/16 1821 03/12/16 0702 03/14/16 0600  AST  --  29  --   ALT  --  13*  --   ALKPHOS  --  235*  --   BILITOT  --  1.3*  --   PROT  --  5.6*  --   ALBUMIN 1.6* 1.7* 1.7*   CBC:  Recent Labs Lab 03/12/16 0702 03/13/16 0551 03/14/16 0500 03/15/16 0436 03/16/16 0450  WBC 13.1* 14.6* 18.4* 19.3* 17.5*  HGB 8.0* 8.2* 8.1* 9.0* 8.0*  HCT 25.4* 25.4* 25.0* 27.2* 25.1*  MCV 98.8 98.4 97.3 97.8 97.3  PLT 192 195 229 273 260   Cardiac Enzymes:  Recent Labs Lab 03/09/16 1024  TROPONINI 2.78*   CBG:  Recent Labs Lab 03/15/16 0913 03/15/16 1212 03/15/16 1700 03/15/16 2058 03/16/16 0810  GLUCAP 99 104* 124* 104* 61*   Medications: . sodium chloride 10 mL/hr at 03/11/16 1324  . heparin 1,550 Units/hr (03/15/16 1928)   . dextrose      .  calcitRIOL  0.25 mcg Oral Q M,W,F-1800  . calcium acetate  667 mg Oral TID WC  . darbepoetin (ARANESP) injection - DIALYSIS  100 mcg Intravenous Q Wed-HD  . feeding supplement (NEPRO CARB STEADY)  237 mL Oral BID BM  . feeding supplement (PRO-STAT SUGAR FREE 64)  30 mL Oral BID  . ferric gluconate (FERRLECIT/NULECIT) IV  250 mg Intravenous Q M,W,F-HD  . gabapentin  300 mg Oral QPM  . insulin aspart  0-5 Units Subcutaneous QHS  . insulin aspart  0-9 Units Subcutaneous TID WC  . insulin detemir  5 Units Subcutaneous QHS  . levothyroxine  175 mcg Oral QAC breakfast  . multivitamin  1 tablet Oral QHS  . polyethylene glycol  17 g Oral Daily  . senna-docusate  1 tablet Oral Daily  . sodium chloride flush  3 mL Intravenous Q12H  . tacrolimus  2 mg Oral BID  . Warfarin - Pharmacist Dosing Inpatient   Does not apply q1800    Dialysis Orders: [name] MWF 3h 60min 59.5kg Hep none L fem AVG Po calcitriol 0.73mcg /HD MIrcera 60 mcg q 2wks (was to start 03/05/16) Other op labs hgb 9.8 Ca  9.3 phos 8.7 PTH 1294  Assessment/Plan: 1. PAD/ L foot ischemia: s/pL thigh AVG ligation 1/30w/ right IJ cath insertion by VVS to hopefully improve wound healing on the left heel. Plan per vascular surgery. 2. Fever/?Pneumonia: BCx 1/26 1 of 2 CoNS, likely contaminant. BCx 1/28 negative. S/p 7d course of Vanc/Cefepime. WBC lower today. 3. ESRD: Continue MWF schedule, next 2/5.  4. Aflutter/RVR/AVR: on heparin/warfarin, pharmacy dosing. 5. Chest Pain /CAD: Cardiology consulted, felt to be "demand ischemia." 6. BP/Volume : Above her outpt EDW per weights. CXR 1/31 with volume excess. UF as tolerated, trying to get back to her prior EDW if possible. 7. Anemia: Hgb 8 with low tsat. On Aranesp weekly and IV iron. 8. 2HPTH: Continuevit D/ phoslo. 9. Hx Liver transplant (d/t Hep C): Per primary. 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  11. COPD 12. Nutrition: Alb 1.7, on supplements. 13. Goals of care: seen by  Palliative Care; multiple medical issues; DNR/doesn't want aggressive measures.  Veneta Penton, PA-C 03/16/2016, 8:46 AM  [facility] Pager: [phone]  Pt seen and examined. Agree w/ PA A/P Her left leg pain is certainly better when she is not on dialysis. Narcs should not be dialyzed out so unsure why she's in more pain during HD esp with hd through a catheter.  Otelia Santee, MD [facility]

## 2016-03-16 NOTE — Progress Notes (Signed)
Hypoglycemic Event  CBG: 61  Treatment  Dextrose 50% 25 ml  Symptoms: Lethargic  Follow-up CBG: Time: [id number] CBG Result: 156  Possible Reasons for Event ; Poor food intake. Comments/MD notified:yes    [employer], Gwenlyn Perking [place]

## 2016-03-16 NOTE — Progress Notes (Signed)
PROGRESS NOTE  [name]  [id number] DOB: 1955-06-03 DOA: 03/05/2016 PCP: No PCP Per Patient Outpatient Specialists:  Subjective: No changes clinically today, pain in the left foot improved. Noted constipation. INR is 1.5 (target INR is 2.5-3.5).  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 Presents to ED on 1/24 from HD with A.Flutter HR of 180. Patient reported left foot pain in which she is being followed by vascular. Upon arrival to ED patient reports chest pain and had pos troponins as well as a small left pleural effusion on chest xray. Cardiology was consulted and believed that elevated trops were due to ischemic demand in setting of a possible infection. During stay patient has been febrile, lactic acid 1.5, and pending blood cultures. Patient was admitted to step-down under [facility]. On 1/26 patient became increasingly lethargic and hypotensive. [facility] M was consulted and patient transferred to ICU. Patient did not wish to be intubated on BiPAP. She was seen by vascular surgery plan for removal of left thigh graft and place new thigh graft and catheter. Initially patient and family refused  the procedure.  Assessment & Plan:   Active Problems:   Hypertensive heart disease   GERD   End stage renal disease on dialysis-TTS   CAD (coronary artery disease), native coronary artery   History of prosthetic aortic valve replacement   COPD (chronic obstructive pulmonary disease) (HCC)   Chronic anticoagulation w/ coumadin, goal INR 2.5-3.0 w/ AVR   S/P liver transplant ([place])   Chronic hepatitis C without hepatic coma (HCC)   Elevated troponin   Claudication of left lower extremity (HCC)   Sepsis (HCC)   Hypotension   Goals of care, counseling/discussion   Palliative care by specialist   Chest pain/CAD -Patient has significant history of CAD, troponin was elevated. Cardiology was consulted,  initially nuclear Myoview was planned but canceled by cardiology. -Chest pain resolved, no additional workup is recommended by cardiology.  Coagulase negative staph bacteremia -1/2 Blood cultures from 1/26 showed coagulase-negative staph likely contaminant. -Repeat cultures from 1/28 is still NGTD. Was on vancomycin for HCAP.  Healthcare associated pneumonia -Patient diagnosed with healthcare associated pneumonia, started on vancomycin and cefepime.  -Continue supportive management with bronchodilators, mucolytics and oxygen as needed. -Today is day 7 of vancomycin and cefepime will discontinue.  Leukocytosis. -She has persistent leukocytosis was 17.2 on admission, went up to 20.5. -WBC is 14.6 today, antibiotics to be discontinued.  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 Continue Aranesp  Diabetes mellitus- blood glucose is stable, continue sliding scale insulin with NovoLog.  Chronic left lower extremity pain-patient was seen by vascular surgery, initially there was planned to remove the left thigh graft and replaced with high new left thigh graft and catheter. Patient and family earlier had refused the procedure, yesterday morning  patient told palliative  care that she wants to have the procedure.Vascular surgery was re consulted, plan to undergo ligation of left AV graft. She continues to have pain every now and then.  COPD- Stable. Continue duo nebs when necessary  ESRD on hemodialysis- followed by nephrology, on hemodialysis Tuesday Thursday and Saturday.  Chronic immunosuppression in the setting of liver transplantation/Hepatitis C- continue Prograf  Hypothyroidism-TSH 4.836, continue Synthroid.  Chronic anticoagulation -She is on Coumadin for mechanical aortic valve. -Currently she is on heparin drip, Coumadin restarted, INR is 1.57.  Status post liver  transplant -On Prograf continued.  DVT prophylaxis: Heparin drip, will restart  Coumadin Code Status: DNR Family Communication:  Disposition Plan: Skilled nursing facility, barrier to discharge needs therapeutic INR. Diet: Diet regular Room service appropriate? Yes; Fluid consistency: Thin; Fluid restriction: 1200 mL Fluid  Consultants:   Nephrology  Procedures:   None  Antimicrobials:   Cefepime and vancomycin discontinued.  Objective: Vitals:   03/15/16 1704 03/15/16 2100 03/16/16 0512 03/16/16 0900  BP: (!) 142/62 (!) 146/61 (!) 165/73 (!) 155/62  Pulse: 90 92 90 95  Resp: 17 18 18 18   Temp: 99.5 F (37.5 C) 98.7 F (37.1 C) 98.5 F (36.9 C) 98.5 F (36.9 C)  TempSrc: Oral Oral Oral Oral  SpO2: 98% 99% 100% 100%  Weight:  63.2 kg (139 lb 5.3 oz)    Height:        Intake/Output Summary (Last 24 hours) at 03/16/16 1221 Last data filed at 03/16/16 0919  Gross per 24 hour  Intake              660 ml  Output                0 ml  Net              660 ml   Filed Weights   03/14/16 0710 03/14/16 2122 03/15/16 2100  Weight: 63.8 kg (140 lb 10.5 oz) 62.3 kg (137 lb 5.6 oz) 63.2 kg (139 lb 5.3 oz)    Examination: General exam: Appears calm and comfortable  Respiratory system: Clear to auscultation. Respiratory effort normal. Cardiovascular system: S1 & S2 heard, RRR. No JVD, murmurs, rubs, gallops or clicks. No pedal edema. Gastrointestinal system: Abdomen is nondistended, soft and nontender. No organomegaly or masses felt. Normal bowel sounds heard. Central nervous system: Alert and oriented. No focal neurological deficits. Extremities: Symmetric 5 x 5 power. Skin: No rashes, lesions or ulcers Psychiatry: Judgement and insight appear normal. Mood & affect appropriate.   Data Reviewed: I have personally reviewed following labs and imaging studies  CBC:  Recent Labs Lab 03/12/16 0702 03/13/16 0551 03/14/16 0500 03/15/16 0436 03/16/16 0450  WBC 13.1* 14.6* 18.4* 19.3* 17.5*  HGB 8.0* 8.2* 8.1* 9.0* 8.0*  HCT 25.4* 25.4* 25.0* 27.2*  25.1*  MCV 98.8 98.4 97.3 97.8 97.3  PLT 192 195 229 273 [id number]   Basic Metabolic Panel:  Recent Labs Lab 03/09/16 1940 03/10/16 0150 03/11/16 0624 03/11/16 1821 03/12/16 0702 03/14/16 0600  NA 134* 134* 135 133* 135 132*  K 3.4* 3.7 3.6 3.4* 3.4* 4.0  CL 97* 96* 99* 96* 98* 96*  CO2 28 26 26 30 30 27   GLUCOSE 106* 107* 66 99 64* 60*  BUN 15 16 7 10 14  27*  CREATININE 4.49* 5.21* 3.62* 4.27* 5.06* 5.16*  CALCIUM 8.0* 8.3* 9.1 8.5* 8.6* 8.8*  PHOS 4.5  --   --  4.7* 4.9* 4.2   GFR: Estimated Creatinine Clearance: 9.9 mL/min (by C-G formula based on SCr of 5.16 mg/dL (H)). Liver Function Tests:  Recent Labs Lab 03/09/16 1940 03/11/16 0624 03/11/16 1821 03/12/16 0702 03/14/16 0600  AST  --   --   --  29  --   ALT  --   --   --  13*  --   ALKPHOS  --   --   --  235*  --   BILITOT  --   --   --  1.3*  --   PROT  --   --   --  5.6*  --   ALBUMIN 1.6* 2.0* 1.6* 1.7* 1.7*   No results for input(s): LIPASE, AMYLASE in the last 168 hours. No results for input(s): AMMONIA in the last 168 hours. Coagulation Profile:  Recent Labs Lab 03/10/16 0150 03/14/16 0500 03/15/16 0436 03/16/16 0450  INR 1.69 1.35 1.57 1.91   Cardiac Enzymes: No results for input(s): CKTOTAL, CKMB, CKMBINDEX, TROPONINI in the last 168 hours. BNP (last 3 results) No results for input(s): PROBNP in the last 8760 hours. HbA1C: No results for input(s): HGBA1C in the last 72 hours. CBG:  Recent Labs Lab 03/15/16 1212 03/15/16 1700 03/15/16 2058 03/16/16 0810 03/16/16 0849  GLUCAP 104* 124* 104* 61* 156*   Lipid Profile: No results for input(s): CHOL, HDL, LDLCALC, TRIG, CHOLHDL, LDLDIRECT in the last 72 hours. Thyroid Function Tests: No results for input(s): TSH, T4TOTAL, FREET4, T3FREE, THYROIDAB in the last 72 hours. Anemia Panel: No results for input(s): VITAMINB12, FOLATE, FERRITIN, TIBC, IRON, RETICCTPCT in the last 72 hours. Urine analysis:    Component Value Date/Time    COLORURINE YELLOW 11/02/2012 0428   APPEARANCEUR CLOUDY (A) 11/02/2012 0428   LABSPEC 1.011 11/02/2012 0428   PHURINE 8.0 11/02/2012 0428   GLUCOSEU 250 (A) 11/02/2012 0428   HGBUR LARGE (A) 11/02/2012 0428   BILIRUBINUR NEGATIVE 11/02/2012 0428   KETONESUR NEGATIVE 11/02/2012 0428   PROTEINUR >300 (A) 11/02/2012 0428   UROBILINOGEN 0.2 11/02/2012 0428   NITRITE NEGATIVE 11/02/2012 0428   LEUKOCYTESUR NEGATIVE 11/02/2012 0428   Sepsis Labs: @LABRCNTIP (procalcitonin:4,lacticidven:4)  ) Recent Results (from the past 240 hour(s))  Culture, blood (Routine X 2) w Reflex to ID Panel     Status: None   Collection Time: 03/07/16  8:52 AM  Result Value Ref Range Status   Specimen Description BLOOD LEFT HAND  Final   Special Requests BOTTLES DRAWN AEROBIC ONLY 5CC  Final   Culture NO GROWTH 5 DAYS  Final   Report Status 03/12/2016 FINAL  Final  Culture, blood (Routine X 2) w Reflex to ID Panel     Status: Abnormal   Collection Time: 03/07/16  8:52 AM  Result Value Ref Range Status   Specimen Description BLOOD RIGHT HAND  Final   Special Requests IN PEDIATRIC BOTTLE 3CC  Final   Culture  Setup Time   Final    GRAM POSITIVE COCCI IN CLUSTERS IN PEDIATRIC BOTTLE CRITICAL RESULT CALLED TO, READ BACK BY AND VERIFIED WITH: M MACCIA,PHARMD AT [id number] 03/08/16 BY L BENFIELD    Culture (A)  Final    STAPHYLOCOCCUS SPECIES (COAGULASE NEGATIVE) THE SIGNIFICANCE OF ISOLATING THIS ORGANISM FROM A SINGLE SET OF BLOOD CULTURES WHEN MULTIPLE SETS ARE DRAWN IS UNCERTAIN. PLEASE NOTIFY THE MICROBIOLOGY DEPARTMENT WITHIN ONE WEEK IF SPECIATION AND SENSITIVITIES ARE REQUIRED.    Report Status 03/10/2016 FINAL  Final  Blood Culture ID Panel (Reflexed)     Status: Abnormal   Collection Time: 03/07/16  8:52 AM  Result Value Ref Range Status   Enterococcus species NOT DETECTED NOT DETECTED Final   Listeria monocytogenes NOT DETECTED NOT DETECTED Final   Staphylococcus species DETECTED (A) NOT DETECTED Final     Comment: CRITICAL RESULT CALLED TO, READ BACK BY AND VERIFIED WITH: M MACCIA,PHARMD AT [id number] 03/08/16 BY L BENFIELD    Staphylococcus aureus NOT DETECTED NOT DETECTED Final   Methicillin resistance NOT DETECTED NOT DETECTED Final   Streptococcus species NOT DETECTED NOT DETECTED Final   Streptococcus agalactiae NOT DETECTED NOT DETECTED Final   Streptococcus pneumoniae  NOT DETECTED NOT DETECTED Final   Streptococcus pyogenes NOT DETECTED NOT DETECTED Final   Acinetobacter baumannii NOT DETECTED NOT DETECTED Final   Enterobacteriaceae species NOT DETECTED NOT DETECTED Final   Enterobacter cloacae complex NOT DETECTED NOT DETECTED Final   Escherichia coli NOT DETECTED NOT DETECTED Final   Klebsiella oxytoca NOT DETECTED NOT DETECTED Final   Klebsiella pneumoniae NOT DETECTED NOT DETECTED Final   Proteus species NOT DETECTED NOT DETECTED Final   Serratia marcescens NOT DETECTED NOT DETECTED Final   Haemophilus influenzae NOT DETECTED NOT DETECTED Final   Neisseria meningitidis NOT DETECTED NOT DETECTED Final   Pseudomonas aeruginosa NOT DETECTED NOT DETECTED Final   Candida albicans NOT DETECTED NOT DETECTED Final   Candida glabrata NOT DETECTED NOT DETECTED Final   Candida krusei NOT DETECTED NOT DETECTED Final   Candida parapsilosis NOT DETECTED NOT DETECTED Final   Candida tropicalis NOT DETECTED NOT DETECTED Final  Respiratory Panel by PCR     Status: None   Collection Time: 03/08/16  9:45 AM  Result Value Ref Range Status   Adenovirus NOT DETECTED NOT DETECTED Final   Coronavirus 229E NOT DETECTED NOT DETECTED Final   Coronavirus HKU1 NOT DETECTED NOT DETECTED Final   Coronavirus NL63 NOT DETECTED NOT DETECTED Final   Coronavirus OC43 NOT DETECTED NOT DETECTED Final   Metapneumovirus NOT DETECTED NOT DETECTED Final   Rhinovirus / Enterovirus NOT DETECTED NOT DETECTED Final   Influenza A NOT DETECTED NOT DETECTED Final   Influenza B NOT DETECTED NOT DETECTED Final    Parainfluenza Virus 1 NOT DETECTED NOT DETECTED Final   Parainfluenza Virus 2 NOT DETECTED NOT DETECTED Final   Parainfluenza Virus 3 NOT DETECTED NOT DETECTED Final   Parainfluenza Virus 4 NOT DETECTED NOT DETECTED Final   Respiratory Syncytial Virus NOT DETECTED NOT DETECTED Final   Bordetella pertussis NOT DETECTED NOT DETECTED Final   Chlamydophila pneumoniae NOT DETECTED NOT DETECTED Final   Mycoplasma pneumoniae NOT DETECTED NOT DETECTED Final  Culture, blood (routine x 2)     Status: None   Collection Time: 03/09/16 11:59 AM  Result Value Ref Range Status   Specimen Description BLOOD HEMODIALYSIS CATHETER  Final   Special Requests BOTTLES DRAWN AEROBIC AND ANAEROBIC 10CC EAC  Final   Culture NO GROWTH 5 DAYS  Final   Report Status 03/14/2016 FINAL  Final  Culture, blood (routine x 2)     Status: None   Collection Time: 03/09/16 12:08 PM  Result Value Ref Range Status   Specimen Description BLOOD RIGHT HAND  Final   Special Requests IN PEDIATRIC BOTTLE 2.5CC  Final   Culture NO GROWTH 5 DAYS  Final   Report Status 03/14/2016 FINAL  Final  Surgical pcr screen     Status: None   Collection Time: 03/10/16 11:11 PM  Result Value Ref Range Status   MRSA, PCR NEGATIVE NEGATIVE Final   Staphylococcus aureus NEGATIVE NEGATIVE Final    Comment:        The Xpert SA Assay (FDA approved for NASAL specimens in patients over 80 years of age), is one component of a comprehensive surveillance program.  Test performance has been validated by [facility] for patients greater than or equal to 41 year old. It is not intended to diagnose infection nor to guide or monitor treatment.   MRSA PCR Screening     Status: None   Collection Time: 03/11/16  1:00 PM  Result Value Ref Range Status   MRSA by  PCR NEGATIVE NEGATIVE Final    Comment:        The GeneXpert MRSA Assay (FDA approved for NASAL specimens only), is one component of a comprehensive MRSA colonization surveillance program.  It is not intended to diagnose MRSA infection nor to guide or monitor treatment for MRSA infections.      Invalid input(s): PROCALCITONIN, [place]   Radiology Studies: No results found.      Scheduled Meds: . calcitRIOL  0.25 mcg Oral Q M,W,F-1800  . calcium acetate  667 mg Oral TID WC  . darbepoetin (ARANESP) injection - DIALYSIS  100 mcg Intravenous Q Wed-HD  . dextrose      . feeding supplement (NEPRO CARB STEADY)  237 mL Oral BID BM  . feeding supplement (PRO-STAT SUGAR FREE 64)  30 mL Oral BID  . ferric gluconate (FERRLECIT/NULECIT) IV  250 mg Intravenous Q M,W,F-HD  . gabapentin  300 mg Oral QPM  . insulin aspart  0-5 Units Subcutaneous QHS  . insulin aspart  0-9 Units Subcutaneous TID WC  . insulin detemir  5 Units Subcutaneous QHS  . levothyroxine  175 mcg Oral QAC breakfast  . multivitamin  1 tablet Oral QHS  . senna-docusate  1 tablet Oral Daily  . sodium chloride flush  3 mL Intravenous Q12H  . tacrolimus  2 mg Oral BID  . warfarin  4 mg Oral ONCE-1800  . Warfarin - Pharmacist Dosing Inpatient   Does not apply q1800   Continuous Infusions: . sodium chloride 10 mL/hr at 03/11/16 1324  . heparin 1,550 Units/hr (03/16/16 1003)     LOS: 10 days    Time spent: 35 minutes    Lesslie Mossa A, MD [facility] Pager [phone]  If 7PM-7AM, please contact night-coverage [email or web address] Password [email or web address] 03/16/2016, 12:21 PM

## 2016-03-17 DIAGNOSIS — L89152 Pressure ulcer of sacral region, stage 2: Secondary | ICD-10-CM

## 2016-03-17 LAB — DIFFERENTIAL
BASOS PCT: 0 %
Basophils Absolute: 0.1 10*3/uL (ref 0.0–0.1)
EOS ABS: 0.2 10*3/uL (ref 0.0–0.7)
Eosinophils Relative: 1 %
LYMPHS ABS: 4 10*3/uL (ref 0.7–4.0)
Lymphocytes Relative: 12 %
MONO ABS: 2.9 10*3/uL — AB (ref 0.1–1.0)
Monocytes Relative: 9 %
Neutro Abs: 25.3 10*3/uL — ABNORMAL HIGH (ref 1.7–7.7)
Neutrophils Relative %: 78 %

## 2016-03-17 LAB — CBC
HCT: 25.6 % — ABNORMAL LOW (ref 36.0–46.0)
HEMOGLOBIN: 8.4 g/dL — AB (ref 12.0–15.0)
MCH: 32.2 pg (ref 26.0–34.0)
MCHC: 32.8 g/dL (ref 30.0–36.0)
MCV: 98.1 fL (ref 78.0–100.0)
Platelets: 278 10*3/uL (ref 150–400)
RBC: 2.61 MIL/uL — AB (ref 3.87–5.11)
RDW: 17.1 % — ABNORMAL HIGH (ref 11.5–15.5)
WBC: 28.7 10*3/uL — ABNORMAL HIGH (ref 4.0–10.5)

## 2016-03-17 LAB — GLUCOSE, CAPILLARY
GLUCOSE-CAPILLARY: 99 mg/dL (ref 65–99)
GLUCOSE-CAPILLARY: 93 mg/dL (ref 65–99)
GLUCOSE-CAPILLARY: 137 mg/dL — AB (ref 65–99)

## 2016-03-17 LAB — RENAL FUNCTION PANEL
ALBUMIN: 1.7 g/dL — AB (ref 3.5–5.0)
ANION GAP: 10 (ref 5–15)
BUN: 38 mg/dL — ABNORMAL HIGH (ref 6–20)
CO2: 23 mmol/L (ref 22–32)
Calcium: 8.5 mg/dL — ABNORMAL LOW (ref 8.9–10.3)
Chloride: 97 mmol/L — ABNORMAL LOW (ref 101–111)
Creatinine, Ser: 7.02 mg/dL — ABNORMAL HIGH (ref 0.44–1.00)
GFR, EST AFRICAN AMERICAN: 7 mL/min — AB (ref >60)
GFR, EST NON AFRICAN AMERICAN: 6 mL/min — AB (ref >60)
Glucose, Bld: 108 mg/dL — ABNORMAL HIGH (ref 65–99)
PHOSPHORUS: 4.2 mg/dL (ref 2.5–4.6)
POTASSIUM: 4.6 mmol/L (ref 3.5–5.1)
Sodium: 130 mmol/L — ABNORMAL LOW (ref 135–145)

## 2016-03-17 LAB — PROTIME-INR
INR: 3.1
Prothrombin Time: 32.6 seconds — ABNORMAL HIGH (ref 11.4–15.2)

## 2016-03-17 LAB — HEPARIN LEVEL (UNFRACTIONATED): Heparin Unfractionated: 0.56 IU/mL (ref 0.30–0.70)

## 2016-03-17 MED ORDER — ALTEPLASE 2 MG IJ SOLR
2.0000 mg | Freq: Once | INTRAMUSCULAR | Status: DC | PRN
Start: 2016-03-17 — End: 2016-03-17

## 2016-03-17 MED ORDER — SODIUM CHLORIDE 0.9 % IV SOLN: 100.0000 mL | INTRAVENOUS | Status: DC | PRN

## 2016-03-17 MED ORDER — LIDOCAINE HCL (PF) 1 % IJ SOLN: 5.0000 mL | INTRAMUSCULAR | Status: DC | PRN

## 2016-03-17 MED ORDER — HEPARIN SODIUM (PORCINE) 1000 UNIT/ML DIALYSIS: 1000.0000 [IU] | INTRAMUSCULAR | Status: DC | PRN

## 2016-03-17 MED ORDER — METOPROLOL TARTRATE 5 MG/5ML IV SOLN
INTRAVENOUS | Status: AC
  Filled 2016-03-17: qty 5

## 2016-03-17 MED ORDER — CALCITRIOL 0.25 MCG PO CAPS
ORAL_CAPSULE | ORAL | Status: AC
  Filled 2016-03-17: qty 1

## 2016-03-17 MED ORDER — PENTAFLUOROPROP-TETRAFLUOROETH EX AERO: 1.0000 "application " | INHALATION_SPRAY | CUTANEOUS | Status: DC | PRN

## 2016-03-17 MED ORDER — OXYCODONE HCL 5 MG PO TABS: 5.0000 mg | ORAL_TABLET | ORAL | 0 refills | Status: DC | PRN

## 2016-03-17 MED ORDER — MILK AND MOLASSES ENEMA
1.0000 | Freq: Once | RECTAL | Status: AC
  Administered 2016-03-17: 250 mL via RECTAL
  Filled 2016-03-17: qty 250

## 2016-03-17 MED ORDER — METOPROLOL TARTRATE 5 MG/5ML IV SOLN
2.5000 mg | Freq: Once | INTRAVENOUS | Status: AC
  Administered 2016-03-17: 2.5 mg via INTRAVENOUS

## 2016-03-17 MED ORDER — LIDOCAINE-PRILOCAINE 2.5-2.5 % EX CREA: 1.0000 "application " | TOPICAL_CREAM | CUTANEOUS | Status: DC | PRN

## 2016-03-17 MED ORDER — METOPROLOL TARTRATE 25 MG PO TABS
25.0000 mg | ORAL_TABLET | Freq: Two times a day (BID) | ORAL | Status: DC
  Administered 2016-03-17 – 2016-03-20 (×4): 25 mg via ORAL
  Filled 2016-03-17 (×6): qty 1

## 2016-03-17 NOTE — Clinical Social Work Note (Addendum)
CSW talked with patient at the bedside and daughter Olevia Bowens by phone [phone]) regarding discharge dispostion and recommendation of ST rehab. Daughter will talk to her sister and get back with CSW on Tuesday, 2/6.   Cleola Perryman Givens, MSW, LCSW Licensed Clinical Social Worker [place] [phone]

## 2016-03-17 NOTE — Progress Notes (Signed)
[name] Progress Note   Subjective:  Seen on HD.  No new c/os ,  L foot not really much better since ligation of L fem AVG  Objective Vitals:   03/17/16 0759 03/17/16 0800 03/17/16 0815 03/17/16 0830  BP: (!) 87/50 (!) 113/54 140/72 (!) 164/78  Pulse: (!) 105 100 97 100  Resp:      Temp:      TempSrc:      SpO2:      Weight:      Height:       Physical Exam General: Frail appearing female, NAD. Heart: RRR, mechanical click murmur present Lungs: CTA anteriorly. Abdomen: soft, non-tender Extremities: No LE edema. Blackish skin color changes L > R foot. Ulceration on L heel with tenderness. Dialysis Access: TDC in R chest. L AVG has been ligated, no bruit  Additional Objective Labs: Basic Metabolic Panel:  Recent Labs Lab 03/12/16 0702 03/14/16 0600 03/17/16 0738  NA 135 132* 130*  K 3.4* 4.0 4.6  CL 98* 96* 97*  CO2 30 27 23   GLUCOSE 64* 60* 108*  BUN 14 27* 38*  CREATININE 5.06* 5.16* 7.02*  CALCIUM 8.6* 8.8* 8.5*  PHOS 4.9* 4.2 4.2   Liver Function Tests:  Recent Labs Lab 03/12/16 0702 03/14/16 0600 03/17/16 0738  AST 29  --   --   ALT 13*  --   --   ALKPHOS 235*  --   --   BILITOT 1.3*  --   --   PROT 5.6*  --   --   ALBUMIN 1.7* 1.7* 1.7*   CBC:  Recent Labs Lab 03/13/16 0551 03/14/16 0500 03/15/16 0436 03/16/16 0450 03/17/16 0523  WBC 14.6* 18.4* 19.3* 17.5* 28.7*  NEUTROABS  --   --   --   --  PENDING  HGB 8.2* 8.1* 9.0* 8.0* 8.4*  HCT 25.4* 25.0* 27.2* 25.1* 25.6*  MCV 98.4 97.3 97.8 97.3 98.1  PLT 195 229 273 260 278   Cardiac Enzymes: No results for input(s): CKTOTAL, CKMB, CKMBINDEX, TROPONINI in the last 168 hours. CBG:  Recent Labs Lab 03/16/16 0810 03/16/16 0849 03/16/16 1228 03/16/16 1744 03/16/16 2113  GLUCAP 61* 156* 113* 120* 122*   Medications: . sodium chloride 10 mL/hr at 03/11/16 1324   . calcitRIOL      . calcitRIOL  0.25 mcg Oral Q M,W,F-1800  . calcium acetate  667 mg Oral TID WC  .  darbepoetin (ARANESP) injection - DIALYSIS  100 mcg Intravenous Q Wed-HD  . feeding supplement (NEPRO CARB STEADY)  237 mL Oral BID BM  . feeding supplement (PRO-STAT SUGAR FREE 64)  30 mL Oral BID  . ferric gluconate (FERRLECIT/NULECIT) IV  250 mg Intravenous Q M,W,F-HD  . gabapentin  300 mg Oral QPM  . insulin aspart  0-5 Units Subcutaneous QHS  . insulin aspart  0-9 Units Subcutaneous TID WC  . insulin detemir  5 Units Subcutaneous QHS  . levothyroxine  175 mcg Oral QAC breakfast  . multivitamin  1 tablet Oral QHS  . polyethylene glycol  17 g Oral Daily  . senna-docusate  1 tablet Oral Daily  . sodium chloride flush  3 mL Intravenous Q12H  . tacrolimus  2 mg Oral BID  . Warfarin - Pharmacist Dosing Inpatient   Does not apply q1800    Dialysis Orders: [name] MWF 3h 83min 59.5kg Hep none L fem AVG Po calcitriol 0.64mcg /HD MIrcera 60 mcg q 2wks (was to start 03/05/16) Other  op labs hgb 9.8 Ca 9.3 phos 8.7 PTH 1294  Assessment/Plan: 1. PAD/ L foot ischemia: s/pL thigh AVG ligation to hopefully improve wound healing on the left heel. Plan per vascular surgery. 2. Fever/possible PNA: BCx 1/26 1 of 2 CoNS, likely contaminant. BCx 1/28 negative. S/p 7d course of Vanc/Cefepime 3. ESRD: HD MWF, s/pL thigh AVG ligation 1/30w/ right IJ cath insertion by [facility].  "If her overall condition improves will place right thigh graft a later interval" per [facility] consult note. 4. Aflutter/RVR/AVR: on heparin/warfarin, pharmacy dosing. 5. Chest Pain /CAD: Cardiology consulted, felt to be "demand ischemia." 6. BP/Volume : Above her outpt EDW per weights. CXR 1/31 with volume excess. UF as tolerated, trying to get back to her prior EDW if possible. 7. Anemia: Hgb 8 with low tsat. On Aranesp weekly and IV iron. 8. 2HPTH: Continuevit D/ phoslo. 9. Hx Liver transplant (d/t Hep C): Per primary. 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  11. COPD 12. Nutrition: Alb 1.7, on supplements. 13. Goals of care: seen by  Palliative Care; multiple medical issues; DNR/doesn't want aggressive measures. 62. DIspo - per primary plan is for dc to SNF today  [name] MD [facility] pgr [phone]   03/17/2016, 9:38 AM

## 2016-03-17 NOTE — Discharge Summary (Signed)
Physician Discharge Summary  [name] [id number] DOB: October 12, 1955 DOA: 03/05/2016  PCP: No PCP Per Patient  Admit date: 03/05/2016 Discharge date: 03/17/2016  Admitted From: SNF Disposition: SNF  Recommendations for Outpatient Follow-up:  1. Follow up with PCP in 1-2 weeks 2. Please obtain BMP/CBC in one week  Home Health: NA Equipment/Devices:NA  Discharge Condition: Stable CODE STATUS: DNR Diet recommendation: Diet regular Room service appropriate? 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 Presents to ED on 1/24 from HD with A.Flutter HR of 180. Patient reported left foot pain in which she is being followed by vascular. Upon arrival to ED patient reports chest pain and had pos troponins as well as a small left pleural effusion on chest xray. Cardiology was consulted and believed that elevated trops were due to ischemic demand in setting of a possible infection. During stay patient has been febrile, lactic acid 1.5, and pending blood cultures. Patient was admitted to step-down under [facility]. On 1/26 patient became increasingly lethargic and hypotensive. [name] was consulted and patient transferred to ICU. Patient did not wish to be intubated on BiPAP. She was seen by vascular surgery plan for removal of left thigh graft and place new thigh graft and catheter. Initially patient and family refused the procedure.  Discharge Diagnoses:  Active Problems:   Hypertensive heart disease   GERD   End stage renal disease on dialysis-TTS   CAD (coronary artery disease), native coronary artery   History of prosthetic aortic valve replacement   COPD (chronic obstructive pulmonary disease) (HCC)   Chronic anticoagulation w/ coumadin, goal INR 2.5-3.0 w/ AVR   S/P liver transplant ([place])   Chronic  hepatitis C without hepatic coma (HCC)   Pressure injury of skin, stage 2   Elevated troponin   Claudication of left lower extremity (HCC)   Sepsis (HCC)   Hypotension   Goals of care, counseling/discussion   Palliative care by specialist   Sepsis -This is present on admission, patient did have fever of 103, WBCs of 15.2 and presence of pneumonia and admission. -Lactic acid was elevated at 2.5. -There is is likely secondary to healthcare associated pneumonia. Blood cultures negative (contaminant) -Patient treated with broad-spectrum antibiotics vancomycin and cefepime. Sepsis physiology resolved.  Healthcare associated pneumonia -Patient diagnosed with healthcare associated pneumonia, started on vancomycin and cefepime.  -Treated with supportive management with bronchodilators, mucolytics and oxygen as needed. -Finished intended course of antibiotics for 7 days, vancomycin and cefepime discontinued.  Chest pain/CAD -Patient has significant history of CAD, troponin was elevated. Cardiology was consulted, initially nuclear Myoview was planned but canceled by cardiology. -Chest pain resolved, no additional workup is recommended by cardiology.  Coagulase negative staph bacteremia -1/2 Blood cultures from 1/26 showed coagulase-negative staph likely contaminant. -Repeat cultures from 1/28 is still NGTD. Was on vancomycin for HCAP.  Leukocytosis. -She has persistent leukocytosis was 17.2 on admission, on discharge was 28.7. -Patient does not look septic, vitals are stable, no fever or chills, no other signs of infections. -She denies any diarrhea to suggest CSF as a matter fact she is constipated and she received laxatives prior to discharge. -Please follow closely as patient might need further workup if this is persists.  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 Continue Aranesp  Diabetes mellitus-blood glucose  is stable, continue sliding scale insulin  with NovoLog.  Chronic left lower extremity pain -Patient was seen by vascular surgery, patient went back and forth for agreement of removal of the thigh AV graft. -Underwent removal of the left thigh AV graft and placement of right IJ tunneled dialysis catheter. -She has pain every now and this is controlled by pain medications. -Recent follow-up with vascular surgery as outpatient.  COPD- Stable. Continue duo nebs when necessary  ESRD on hemodialysis-followed by nephrology, on hemodialysis Tuesday Thursday and Saturday.  Chronic immunosuppression in the setting of liver transplantation/Hepatitis C-continue Prograf  Hypothyroidism-TSH 4.836, continue Synthroid.  Chronic anticoagulation target is 2.5-3.5. -She is on Coumadin for mechanical aortic valve. -Coumadin was on hold for procedures, after it was restarted she was bridged with heparin. -On day of discharge INR is 3.1, per pharmacy recommendation restart home dose.  Status post liver transplant -On Prograf continued.  Discharge Instructions  Discharge Instructions    Diet - low sodium heart healthy    Complete by:  As directed    Increase activity slowly    Complete by:  As directed      Allergies as of 03/17/2016      Reactions   Acetaminophen Other (See Comments)   Liver transplant recipient    Codeine Itching   Mirtazapine Other (See Comments)   hallucination   Morphine Itching, Other (See Comments)   hallucinate      Medication List    TAKE these medications   aspirin EC 81 MG tablet Take 81 mg by mouth daily.   calcium acetate 667 MG capsule Commonly known as:  PHOSLO Take 1 capsule (667 mg total) by mouth 3 (three) times daily with meals.   gabapentin 300 MG capsule Commonly known as:  NEURONTIN Take 300 mg by mouth every evening.   LEVEMIR FLEXPEN 100 UNIT/ML Pen Generic drug:  Insulin Detemir Inject 2-6 Units into the skin See admin instructions. Only if cbg is over 200   levothyroxine  175 MCG tablet Commonly known as:  SYNTHROID, LEVOTHROID Take 1 tablet (175 mcg total) by mouth daily before breakfast.   metoprolol tartrate 25 MG tablet Commonly known as:  LOPRESSOR Take 25 mg by mouth 2 (two) times daily.   nitroGLYCERIN 0.4 MG SL tablet Commonly known as:  NITROSTAT Place 1 tablet (0.4 mg total) under the tongue every 5 (five) minutes as needed for chest pain.   ondansetron 4 MG disintegrating tablet Commonly known as:  ZOFRAN ODT Take 1 tablet (4 mg total) by mouth every 8 (eight) hours as needed for nausea.   oxyCODONE 5 MG immediate release tablet Commonly known as:  Oxy IR/ROXICODONE Take 1 tablet (5 mg total) by mouth every 4 (four) hours as needed.   senna-docusate 8.6-50 MG tablet Commonly known as:  Senokot-S Take 1 tablet by mouth daily.   tacrolimus 1 MG capsule Commonly known as:  PROGRAF Take 2 capsules (2 mg total) by mouth 2 (two) times daily. Take 2 capsules twice daily.   warfarin 2 MG tablet Commonly known as:  COUMADIN Take 1- 2 tablets by mouth daily or as directed by coumadin clinic What changed:  how much to take  how to take this  when to take this  additional instructions      Follow-up Information    [facility]   Specialty:  Home Health Services Contact information: [address] [place] [address] [phone]  Allergies  Allergen Reactions  . Acetaminophen Other (See Comments)    Liver transplant recipient   . Codeine Itching  . Mirtazapine Other (See Comments)    hallucination  . Morphine Itching and Other (See Comments)    hallucinate    Consultations: Treatment Team:  Roney Jaffe, MD  Vascular.   Procedures (Echo, Carotid, EGD, Colonoscopy, ERCP)   Radiological studies: Dg Chest 2 View  Result Date: 03/05/2016 CLINICAL DATA:  Diabetes, coronary artery disease, liver transplant patient, history of asthma. No reported chest  complaints. The patient is complaining of foot pain. EXAM: CHEST  2 VIEW COMPARISON:  PA and lateral chest x-ray of November 26, 2015 FINDINGS: The lungs are adequately inflated. There is a trace of pleural fluid at the left lung base. The interstitial markings are mildly prominent though stable. The heart is top-normal in size. There is a prosthetic aortic valve in place. The sternal wires are intact. The pulmonary vascularity is not engorged. There is calcification in the wall of the aortic arch. The bony thorax exhibits no acute abnormality. IMPRESSION: Small left pleural effusion. Mild interstitial prominence which is not entirely new. No alveolar pneumonia nor CHF. Thoracic aortic atherosclerosis. Electronically Signed   By: David  Martinique M.D.   On: 03/05/2016 08:50   Dg Chest Port 1 View  Result Date: 03/12/2016 CLINICAL DATA:  Check dialysis catheter positioning EXAM: PORTABLE CHEST 1 VIEW COMPARISON:  03/09/2016 FINDINGS: Cardiac shadow is enlarged. Postsurgical changes are again seen. A new right jugular dialysis catheter is noted in satisfactory position. Vascular congestion and bilateral effusions are again seen likely related to volume overload. No focal confluent infiltrate is seen. IMPRESSION: Dialysis catheter in satisfactory position. Changes of CHF and pleural effusions likely related to volume overload Electronically Signed   By: Inez Catalina M.D.   On: 03/12/2016 08:13   Dg Chest Port 1 View  Result Date: 03/09/2016 CLINICAL DATA:  Acute respiratory failure EXAM: PORTABLE CHEST 1 VIEW COMPARISON:  03/07/2016 FINDINGS: Prior median sternotomy and valve replacement. Heart is borderline in size. Diffuse bilateral airspace disease and small bilateral effusions. Airspace disease slightly improved since prior study. IMPRESSION: Diffuse bilateral airspace disease, slightly improved, likely improving edema. Small bilateral effusions. Electronically Signed   By: Rolm Baptise M.D.   On: 03/09/2016  07:25   Dg Chest Port 1 View  Result Date: 03/07/2016 CLINICAL DATA:  Fever, hypoxemia. EXAM: PORTABLE CHEST 1 VIEW COMPARISON:  Radiographs of March 05, 2016. FINDINGS: Stable cardiomediastinal silhouette. Status post aortic valve repair. Atherosclerosis of thoracic aorta is noted. No pneumothorax is noted. Interval development of diffuse interstitial densities throughout both lungs concerning for edema or possibly pneumonia. Mild left pleural effusion is noted. Bony thorax is unremarkable. IMPRESSION: Aortic atherosclerosis. Diffuse interstitial densities are noted throughout both lungs concerning for edema or possibly pneumonia. Mild left pleural effusion. Electronically Signed   By: Marijo Conception, M.D.   On: 03/07/2016 09:29   Dg Foot 2 Views Left  Result Date: 03/07/2016 CLINICAL DATA:  Left foot tenderness. EXAM: LEFT FOOT - 2 VIEW COMPARISON:  None. FINDINGS: Extensive vascular calcifications throughout the foot. Plantar and posterior calcaneal spurs. No radiographic changes of osteomyelitis. No fracture, subluxation or dislocation. IMPRESSION: No acute bony abnormality.  Extensive vascular calcifications. Electronically Signed   By: Rolm Baptise M.D.   On: 03/07/2016 09:27   Dg Fluoro Guide Cv Line-no Report  Result Date: 03/11/2016 Fluoroscopy was utilized by the requesting physician.  No radiographic interpretation.  Subjective:  Discharge Exam: Vitals:   03/17/16 0930 03/17/16 1000 03/17/16 1030 03/17/16 1100  BP: (!) 141/68 (!) 162/73 121/60 102/61  Pulse: (!) 107 (!) 106 (!) 105 (!) 101  Resp:      Temp:      TempSrc:      SpO2:      Weight:      Height:       General: Pt is alert, awake, not in acute distress Cardiovascular: RRR, S1/S2 +, no rubs, no gallops Respiratory: CTA bilaterally, no wheezing, no rhonchi Abdominal: Soft, NT, ND, bowel sounds + Extremities: no edema, no cyanosis   The results of significant diagnostics from this hospitalization (including  imaging, microbiology, ancillary and laboratory) are listed below for reference.    Microbiology: Recent Results (from the past 240 hour(s))  Respiratory Panel by PCR     Status: None   Collection Time: 03/08/16  9:45 AM  Result Value Ref Range Status   Adenovirus NOT DETECTED NOT DETECTED Final   Coronavirus 229E NOT DETECTED NOT DETECTED Final   Coronavirus HKU1 NOT DETECTED NOT DETECTED Final   Coronavirus NL63 NOT DETECTED NOT DETECTED Final   Coronavirus OC43 NOT DETECTED NOT DETECTED Final   Metapneumovirus NOT DETECTED NOT DETECTED Final   Rhinovirus / Enterovirus NOT DETECTED NOT DETECTED Final   Influenza A NOT DETECTED NOT DETECTED Final   Influenza B NOT DETECTED NOT DETECTED Final   Parainfluenza Virus 1 NOT DETECTED NOT DETECTED Final   Parainfluenza Virus 2 NOT DETECTED NOT DETECTED Final   Parainfluenza Virus 3 NOT DETECTED NOT DETECTED Final   Parainfluenza Virus 4 NOT DETECTED NOT DETECTED Final   Respiratory Syncytial Virus NOT DETECTED NOT DETECTED Final   Bordetella pertussis NOT DETECTED NOT DETECTED Final   Chlamydophila pneumoniae NOT DETECTED NOT DETECTED Final   Mycoplasma pneumoniae NOT DETECTED NOT DETECTED Final  Culture, blood (routine x 2)     Status: None   Collection Time: 03/09/16 11:59 AM  Result Value Ref Range Status   Specimen Description BLOOD HEMODIALYSIS CATHETER  Final   Special Requests BOTTLES DRAWN AEROBIC AND ANAEROBIC 10CC EAC  Final   Culture NO GROWTH 5 DAYS  Final   Report Status 03/14/2016 FINAL  Final  Culture, blood (routine x 2)     Status: None   Collection Time: 03/09/16 12:08 PM  Result Value Ref Range Status   Specimen Description BLOOD RIGHT HAND  Final   Special Requests IN PEDIATRIC BOTTLE 2.5CC  Final   Culture NO GROWTH 5 DAYS  Final   Report Status 03/14/2016 FINAL  Final  Surgical pcr screen     Status: None   Collection Time: 03/10/16 11:11 PM  Result Value Ref Range Status   MRSA, PCR NEGATIVE NEGATIVE Final    Staphylococcus aureus NEGATIVE NEGATIVE Final    Comment:        The Xpert SA Assay (FDA approved for NASAL specimens in patients over 44 years of age), is one component of a comprehensive surveillance program.  Test performance has been validated by [facility] for patients greater than or equal to 32 year old. It is not intended to diagnose infection nor to guide or monitor treatment.   MRSA PCR Screening     Status: None   Collection Time: 03/11/16  1:00 PM  Result Value Ref Range Status   MRSA by PCR NEGATIVE NEGATIVE Final    Comment:        The GeneXpert MRSA Assay (  FDA approved for NASAL specimens only), is one component of a comprehensive MRSA colonization surveillance program. It is not intended to diagnose MRSA infection nor to guide or monitor treatment for MRSA infections.      Labs: BNP (last 3 results)  Recent Labs  04/21/15 0753 07/09/15 1733  BNP 2,040.6* 123456*   Basic Metabolic Panel:  Recent Labs Lab 03/11/16 0624 03/11/16 1821 03/12/16 0702 03/14/16 0600 03/17/16 0738  NA 135 133* 135 132* 130*  K 3.6 3.4* 3.4* 4.0 4.6  CL 99* 96* 98* 96* 97*  CO2 26 30 30 27 23   GLUCOSE 66 99 64* 60* 108*  BUN 7 10 14  27* 38*  CREATININE 3.62* 4.27* 5.06* 5.16* 7.02*  CALCIUM 9.1 8.5* 8.6* 8.8* 8.5*  PHOS  --  4.7* 4.9* 4.2 4.2   Liver Function Tests:  Recent Labs Lab 03/11/16 0624 03/11/16 1821 03/12/16 0702 03/14/16 0600 03/17/16 0738  AST  --   --  29  --   --   ALT  --   --  13*  --   --   ALKPHOS  --   --  235*  --   --   BILITOT  --   --  1.3*  --   --   PROT  --   --  5.6*  --   --   ALBUMIN 2.0* 1.6* 1.7* 1.7* 1.7*   No results for input(s): LIPASE, AMYLASE in the last 168 hours. No results for input(s): AMMONIA in the last 168 hours. CBC:  Recent Labs Lab 03/13/16 0551 03/14/16 0500 03/15/16 0436 03/16/16 0450 03/17/16 0523  WBC 14.6* 18.4* 19.3* 17.5* 28.7*  NEUTROABS  --   --   --   --  PENDING  HGB 8.2* 8.1*  9.0* 8.0* 8.4*  HCT 25.4* 25.0* 27.2* 25.1* 25.6*  MCV 98.4 97.3 97.8 97.3 98.1  PLT 195 229 273 260 278   Cardiac Enzymes: No results for input(s): CKTOTAL, CKMB, CKMBINDEX, TROPONINI in the last 168 hours. BNP: Invalid input(s): POCBNP CBG:  Recent Labs Lab 03/16/16 0810 03/16/16 0849 03/16/16 1228 03/16/16 1744 03/16/16 2113  GLUCAP 61* 156* 113* 120* 122*   D-Dimer No results for input(s): DDIMER in the last 72 hours. Hgb A1c No results for input(s): HGBA1C in the last 72 hours. Lipid Profile No results for input(s): CHOL, HDL, LDLCALC, TRIG, CHOLHDL, LDLDIRECT in the last 72 hours. Thyroid function studies No results for input(s): TSH, T4TOTAL, T3FREE, THYROIDAB in the last 72 hours.  Invalid input(s): FREET3 Anemia work up No results for input(s): VITAMINB12, FOLATE, FERRITIN, TIBC, IRON, RETICCTPCT in the last 72 hours. Urinalysis    Component Value Date/Time   COLORURINE YELLOW 11/02/2012 0428   APPEARANCEUR CLOUDY (A) 11/02/2012 0428   LABSPEC 1.011 11/02/2012 0428   PHURINE 8.0 11/02/2012 0428   GLUCOSEU 250 (A) 11/02/2012 0428   HGBUR LARGE (A) 11/02/2012 0428   BILIRUBINUR NEGATIVE 11/02/2012 0428   KETONESUR NEGATIVE 11/02/2012 0428   PROTEINUR >300 (A) 11/02/2012 0428   UROBILINOGEN 0.2 11/02/2012 0428   NITRITE NEGATIVE 11/02/2012 0428   LEUKOCYTESUR NEGATIVE 11/02/2012 0428   Sepsis Labs Invalid input(s): PROCALCITONIN,  WBC,  LACTICIDVEN Microbiology Recent Results (from the past 240 hour(s))  Respiratory Panel by PCR     Status: None   Collection Time: 03/08/16  9:45 AM  Result Value Ref Range Status   Adenovirus NOT DETECTED NOT DETECTED Final   Coronavirus 229E NOT DETECTED NOT DETECTED Final   Coronavirus HKU1  NOT DETECTED NOT DETECTED Final   Coronavirus NL63 NOT DETECTED NOT DETECTED Final   Coronavirus OC43 NOT DETECTED NOT DETECTED Final   Metapneumovirus NOT DETECTED NOT DETECTED Final   Rhinovirus / Enterovirus NOT DETECTED NOT  DETECTED Final   Influenza A NOT DETECTED NOT DETECTED Final   Influenza B NOT DETECTED NOT DETECTED Final   Parainfluenza Virus 1 NOT DETECTED NOT DETECTED Final   Parainfluenza Virus 2 NOT DETECTED NOT DETECTED Final   Parainfluenza Virus 3 NOT DETECTED NOT DETECTED Final   Parainfluenza Virus 4 NOT DETECTED NOT DETECTED Final   Respiratory Syncytial Virus NOT DETECTED NOT DETECTED Final   Bordetella pertussis NOT DETECTED NOT DETECTED Final   Chlamydophila pneumoniae NOT DETECTED NOT DETECTED Final   Mycoplasma pneumoniae NOT DETECTED NOT DETECTED Final  Culture, blood (routine x 2)     Status: None   Collection Time: 03/09/16 11:59 AM  Result Value Ref Range Status   Specimen Description BLOOD HEMODIALYSIS CATHETER  Final   Special Requests BOTTLES DRAWN AEROBIC AND ANAEROBIC 10CC EAC  Final   Culture NO GROWTH 5 DAYS  Final   Report Status 03/14/2016 FINAL  Final  Culture, blood (routine x 2)     Status: None   Collection Time: 03/09/16 12:08 PM  Result Value Ref Range Status   Specimen Description BLOOD RIGHT HAND  Final   Special Requests IN PEDIATRIC BOTTLE 2.5CC  Final   Culture NO GROWTH 5 DAYS  Final   Report Status 03/14/2016 FINAL  Final  Surgical pcr screen     Status: None   Collection Time: 03/10/16 11:11 PM  Result Value Ref Range Status   MRSA, PCR NEGATIVE NEGATIVE Final   Staphylococcus aureus NEGATIVE NEGATIVE Final    Comment:        The Xpert SA Assay (FDA approved for NASAL specimens in patients over 37 years of age), is one component of a comprehensive surveillance program.  Test performance has been validated by [facility] for patients greater than or equal to 32 year old. It is not intended to diagnose infection nor to guide or monitor treatment.   MRSA PCR Screening     Status: None   Collection Time: 03/11/16  1:00 PM  Result Value Ref Range Status   MRSA by PCR NEGATIVE NEGATIVE Final    Comment:        The GeneXpert MRSA Assay  (FDA approved for NASAL specimens only), is one component of a comprehensive MRSA colonization surveillance program. It is not intended to diagnose MRSA infection nor to guide or monitor treatment for MRSA infections.      Time coordinating discharge: Over 30 minutes  SIGNED:   Birdie Hopes, MD  [facility] 03/17/2016, 11:27 AM Pager   If 7PM-7AM, please contact night-coverage [email or web address] Password [email or web address]

## 2016-03-17 NOTE — Progress Notes (Signed)
Physical Therapy Treatment Patient Details Name: [name] MRN: [id number] DOB: 12-07-1955 Today's Date: 03/17/2016    History of Present Illness  [name] is a 61 y.o. female with medical history significant end-stage renal disease on Tuesday Thursday Saturday dialysis, CAD with LIMA, AVR, liver transplant, hep C, COPD, diabetes, hypertension, SVT presents to the emergency Department chief complaint persistent/worsening left foot/leg pain and palpitations with chest pain. Initial evaluation reveals elevated troponin, leukocytosis and a flutter HR 180    PT Comments    Limited treatment due to L foot pain with exercise and w/bearing.  Further limited by RN due to earlier tachycardia in HD.  Follow Up Recommendations  Home health PT;Supervision for mobility/OOB     Equipment Recommendations  None recommended by PT    Recommendations for Other Services       Precautions / Restrictions Precautions Precautions: Fall Restrictions Weight Bearing Restrictions: No    Mobility  Bed Mobility Overal bed mobility: Needs Assistance       Supine to sit: Min guard     General bed mobility comments: no assist needed for pt to sit EOB  Transfers Overall transfer level: Needs assistance Equipment used: Rolling walker (2 wheeled) Transfers: Sit to/from [facility] Sit to Stand: Min assist Stand pivot transfers: Min assist       General transfer comment: steady assist due to patient's tendency to list posteriorly  Ambulation/Gait Ambulation/Gait assistance: Min assist           General Gait Details: a few steps taken before RN stepped in to defer further ambulation due to earlier tachycardia in HD.  pt with moderate use of the RW.  Ended up pivoting to the chair.   Stairs            Wheelchair Mobility    Modified Rankin (Stroke Patients Only)       Balance Overall balance assessment: Needs assistance   Sitting balance-Leahy Scale:  Good     Standing balance support: Bilateral upper extremity supported Standing balance-Leahy Scale: Poor Standing balance comment: reliant on the RW                    Cognition Arousal/Alertness: Awake/alert Behavior During Therapy: WFL for tasks assessed/performed;Flat affect Overall Cognitive Status: Within Functional Limits for tasks assessed                      Exercises      General Comments        Pertinent Vitals/Pain Pain Assessment: Faces Faces Pain Scale: Hurts even more Pain Location: L foot Pain Descriptors / Indicators: Sore;Sharp;Moaning Pain Intervention(s): Limited activity within patient's tolerance;Monitored during session;Repositioned    Home Living                      Prior Function            PT Goals (current goals can now be found in the care plan section) Acute Rehab PT Goals Patient Stated Goal: return home PT Goal Formulation: With patient Time For Goal Achievement: 03/25/16 Potential to Achieve Goals: Good Progress towards PT goals: Progressing toward goals    Frequency    Min 3X/week      PT Plan Current plan remains appropriate    Co-evaluation             End of Session   Activity Tolerance: Patient tolerated treatment well;Patient limited by pain Patient left:  in chair;with bed alarm set;with chair alarm set     Time: [id number] PT Time Calculation (min) (ACUTE ONLY): 25 min  Charges:  $Therapeutic Activity: 23-37 mins                    G CodesTessie Fass Gunner Iodice 03/17/2016, 5:40 PM 03/17/2016  [name], PT [phone]  (pager)

## 2016-03-17 NOTE — NC FL2 (Signed)
[place] LEVEL OF CARE SCREENING TOOL     IDENTIFICATION  Patient Name: [name] Birthdate: 26-Aug-1955 Sex: female Admission Date (Current Location): 03/05/2016  [facility] [place] Number:  Herbalist and Address:  [facility] [place] [address] [place], [place] [address]      Provider Number: [id number]  Attending Physician Name and Address:  Verlee Monte, MD  Relative Name and Phone Number:  Laurette Schimke; Noralee Space, Daughter - [phone]     Current Level of Care: Hospital Recommended Level of Care: [place] Prior Approval Number:    Date Approved/Denied:   PASRR Number: [id number]    Discharge Plan: SNF    Current Diagnoses: Patient Active Problem List   Diagnosis Date Noted  . Goals of care, counseling/discussion   . Palliative care by specialist   . Sepsis ([place])   . Hypotension   . Claudication of left lower extremity ([place])   . Elevated troponin 03/05/2016  . Pressure injury of skin, stage 2 11/27/2015  . H/O unilateral nephrectomy 07/06/2015  . Paroxysmal SVT (supraventricular tachycardia) ([place]) 06/22/2014  . Complications due to renal dialysis device, implant, and graft 05/11/2014  . Acute blood loss anemia 03/31/2014  . Renal dialysis device, implant, or graft complication [id number]  . Acute hyperkalemia   . S/P liver transplant ([place])   . Chronic hepatitis C without hepatic coma ([place])   . Pulmonary edema 01/03/2014  . Respiratory failure ([place]) 01/03/2014  . Long term current use of anticoagulant therapy 04/22/2013  . Chronic anticoagulation w/ coumadin, goal INR 2.5-3.0 w/ AVR 04/14/2013  . Tobacco abuse 02/28/2013  . COPD (chronic obstructive pulmonary disease) ([place]) 02/21/2013  . Chronic combined systolic and diastolic congestive heart failure ([place]) 02/06/2013  . Acute respiratory failure ([place]) 11/02/2012  . Acute pulmonary edema  ([place]) 11/02/2012  . Acute on chronic systolic CHF (congestive heart failure) ([place]) 11/02/2012  . Dyslipidemia-LDL 104, not on statin with Hx of liver transplant 07/30/2012  . CAD (coronary artery disease), native coronary artery 07/27/2012  . History of prosthetic aortic valve replacement   . End stage renal disease on dialysis-TTS 02/27/2011  . Hypothyroidism 06/25/2006  . Type 2 diabetes mellitus with chronic kidney disease ([place]) 06/25/2006  . GERD 06/25/2006  . Anemia due to chronic renal failure    . Hypertensive heart disease     Orientation RESPIRATION BLADDER Height & Weight     Self, Time, Situation, Place  Normal Continent Weight: 139 lb 8.8 oz (63.3 kg) (Standing Weight) Height:  5\' 4"  (162.6 cm)  BEHAVIORAL SYMPTOMS/MOOD NEUROLOGICAL BOWEL NUTRITION STATUS      Continent Diet (Regular)  AMBULATORY STATUS COMMUNICATION OF NEEDS Skin   Limited Assist Verbally Other (Comment) (Stage 2 PU to coccyx, Incision to right chest and left thign. PU to sacrum)                       Personal Care Assistance Level of Assistance  Bathing, Feeding, Dressing Bathing Assistance: Limited assistance Feeding assistance: Independent Dressing Assistance: Limited assistance     Functional Limitations Info  Sight, Speech, Hearing Sight Info: Adequate Hearing Info: Adequate Speech Info: Adequate    SPECIAL CARE FACTORS FREQUENCY  PT (By licensed PT)     PT Frequency: Evaluation 1/30 and a minimum of 3X per week therapy recommended              Contractures Contractures Info: Not present  Additional Factors Info  Code Status, Allergies, Insulin Sliding Scale Code Status Info: DNR Allergies Info: Acetaminophen, Codeine, Mirtazapine, Morphine   Insulin Sliding Scale Info: 0-5 Units daily at bedtime; 0-9 Units 3X daily with meals        Current Medications (03/17/2016):  This is the current hospital active medication list Current Facility-Administered Medications   Medication Dose Route Frequency Provider Last Rate Last Dose  . 0.9 %  sodium chloride infusion   Intravenous Continuous Oleta Mouse, MD 10 mL/hr at 03/11/16 1324    . 0.9 %  sodium chloride infusion  100 mL Intravenous PRN Loren Racer, PA-C      . 0.9 %  sodium chloride infusion  100 mL Intravenous PRN Loren Racer, PA-C      . acetaminophen (TYLENOL) suppository 325 mg  325 mg Rectal Q4H PRN Caren Griffins, MD   325 mg at 03/07/16 0834  . acetaminophen (TYLENOL) tablet 650 mg  650 mg Oral Q4H PRN Radene Gunning, NP   650 mg at 03/16/16 0129  . alteplase (CATHFLO ACTIVASE) injection 2 mg  2 mg Intracatheter Once PRN Loren Racer, PA-C      . calcitRIOL (ROCALTROL) capsule 0.25 mcg  0.25 mcg Oral Q M,W,F-1800 Lauren D Bajbus, RPH   0.25 mcg at 03/17/16 1039  . calcium acetate (PHOSLO) capsule 667 mg  667 mg Oral TID WC Radene Gunning, NP   667 mg at 03/16/16 1733  . Darbepoetin Alfa (ARANESP) injection 100 mcg  100 mcg Intravenous Q Wed-HD Alric Seton, PA-C   100 mcg at 03/12/16 0732  . feeding supplement (NEPRO CARB STEADY) liquid 237 mL  237 mL Oral BID BM Alric Seton, PA-C   237 mL at 03/15/16 1050  . feeding supplement (PRO-STAT SUGAR FREE 64) liquid 30 mL  30 mL Oral BID Alric Seton, PA-C   30 mL at 03/16/16 2236  . ferric gluconate (NULECIT) 250 mg in sodium chloride 0.9 % 100 mL IVPB  250 mg Intravenous Q M,W,F-HD Alric Seton, PA-C   250 mg at 03/17/16 1030  . gabapentin (NEURONTIN) capsule 300 mg  300 mg Oral QPM Radene Gunning, NP   300 mg at 03/16/16 1733  . heparin injection 1,000 Units  1,000 Units Dialysis PRN Loren Racer, PA-C      . insulin aspart (novoLOG) injection 0-5 Units  0-5 Units Subcutaneous QHS Lezlie Octave Black, NP      . insulin aspart (novoLOG) injection 0-9 Units  0-9 Units Subcutaneous TID WC Radene Gunning, NP   2 Units at 03/15/16 1754  . insulin detemir (LEVEMIR) injection 5 Units  5 Units Subcutaneous QHS Radene Gunning, NP   5  Units at 03/16/16 2236  . levothyroxine (SYNTHROID, LEVOTHROID) tablet 175 mcg  175 mcg Oral QAC breakfast Radene Gunning, NP   175 mcg at 03/16/16 0804  . lidocaine (PF) (XYLOCAINE) 1 % injection 5 mL  5 mL Intradermal PRN Loren Racer, PA-C      . lidocaine-prilocaine (EMLA) cream 1 application  1 application Topical PRN Loren Racer, PA-C      . metoprolol (LOPRESSOR) injection 5 mg  5 mg Intravenous Q6H PRN Omar Person, NP      . milk and molasses enema  1 enema Rectal Once Verlee Monte, MD      . morphine 4 MG/ML injection 2 mg  2 mg Intravenous Q2H PRN Oswald Hillock, MD  2 mg at 03/14/16 0859  . multivitamin (RENA-VIT) tablet 1 tablet  1 tablet Oral QHS Ernest Haber, PA-C   1 tablet at 03/16/16 2235  . ondansetron (ZOFRAN) injection 4 mg  4 mg Intravenous Q6H PRN Radene Gunning, NP   4 mg at 03/08/16 2145  . ondansetron (ZOFRAN-ODT) disintegrating tablet 4 mg  4 mg Oral Q8H PRN Radene Gunning, NP      . oxyCODONE (Oxy IR/ROXICODONE) immediate release tablet 5 mg  5 mg Oral Q4H PRN Radene Gunning, NP   5 mg at 03/16/16 1120  . pentafluoroprop-tetrafluoroeth (GEBAUERS) aerosol 1 application  1 application Topical PRN Woodfin Ganja Stovall, PA-C      . polyethylene glycol (MIRALAX / GLYCOLAX) packet 17 g  17 g Oral Daily Verlee Monte, MD      . senna-docusate (Senokot-S) tablet 1 tablet  1 tablet Oral Daily Radene Gunning, NP   1 tablet at 03/16/16 1003  . sodium chloride flush (NS) 0.9 % injection 3 mL  3 mL Intravenous Q12H Radene Gunning, NP   3 mL at 03/16/16 2236  . tacrolimus (PROGRAF) capsule 2 mg  2 mg Oral BID Radene Gunning, NP   2 mg at 03/16/16 2235  . Warfarin - Pharmacist Dosing Inpatient   Does not apply q1800 Lauren D Bajbus, [place]         Discharge Medications: Please see discharge summary for a list of discharge medications.  Relevant Imaging Results:  Relevant Lab Results:   Additional Information [phone]. Dialysis patient MWF at [facility] dialysis center    [place], Mila Homer, LCSW

## 2016-03-17 NOTE — Progress Notes (Addendum)
ANTICOAGULATION CONSULT NOTE - Follow Up Consult  Pharmacy Consult for Heparin + warfarin Indication: mechanical AVR, atrial  Allergies  Allergen Reactions  . Acetaminophen Other (See Comments)    Liver transplant recipient   . Codeine Itching  . Mirtazapine Other (See Comments)    hallucination  . Morphine Itching and Other (See Comments)    hallucinate    Patient Measurements: Height: 5\' 4"  (162.6 cm) Weight: 139 lb 8.8 oz (63.3 kg) (Standing Weight) IBW/kg (Calculated) : 54.7 Heparin Dosing Weight: 59 kg  Vital Signs: Temp: 98.8 F (37.1 C) (02/05 0720) Temp Source: Oral (02/05 0720) BP: 140/72 (02/05 0815) Pulse Rate: 97 (02/05 0815)  Labs:  Recent Labs  03/15/16 0436 03/15/16 1543 03/16/16 0450 03/17/16 0523 03/17/16 0738  HGB 9.0*  --  8.0* 8.4*  --   HCT 27.2*  --  25.1* 25.6*  --   PLT 273  --  260 278  --   LABPROT 18.9*  --  22.1* 32.6*  --   INR 1.57  --  1.91 3.10  --   HEPARINUNFRC 0.15* 0.50 0.37 0.56  --   CREATININE  --   --   --   --  7.02*    Estimated Creatinine Clearance: 7.3 mL/min (by C-G formula based on SCr of 7.02 mg/dL (H)).   Assessment: 61 yo female on heparin>warfarin bridge for atrial flutter and h/o mechanical AVR. Pt was on warfarin PTA which was held for procedure while inpatient initially. Procedure completed 1/30 and warfarin resumed. PTA dose warfarin 2 mg daily.   Heparin level remains therapeutic at 0.56 units/mL. INR made large jump this morning- 1.91>3.1 (drawn prior to start of HD- HD can affect INRs if drawn during active dialysis session). Hgb 8.4, plts 278- no overt bleeding noted.   Goal of Therapy:  INR goal 2.5 - 3 per outpatient anticoagulation notes Heparin level 0.3-0.7 units/ml Monitor platelets by anticoagulation protocol: Yes   Plan:  -Stop heparin -Hold warfarin with large jump in INR this morning -Restart warfarin on 2/6 with dose of 2mg  daily with an INR check on 2/8 or 2/9 -Daily, INR and CBC  while in the hospital -Monitor s/sx bleeding  Karlie Aung, Pharm.D. PGY1 Pharmacy Resident 2/5/20188:23 AM Pager [phone]

## 2016-03-17 NOTE — Procedures (Signed)
  I was present at this dialysis session, have reviewed the session itself and made  appropriate changes [name] MD [place] pager [phone]   03/17/2016, 10:50 AM

## 2016-03-18 ENCOUNTER — Ambulatory Visit: Payer: Medicare Other | Admitting: Cardiovascular Disease

## 2016-03-18 ENCOUNTER — Inpatient Hospital Stay (HOSPITAL_COMMUNITY): Payer: Medicare Other

## 2016-03-18 DIAGNOSIS — Z7189 Other specified counseling: Secondary | ICD-10-CM

## 2016-03-18 DIAGNOSIS — R509 Fever, unspecified: Secondary | ICD-10-CM

## 2016-03-18 LAB — GLUCOSE, CAPILLARY
GLUCOSE-CAPILLARY: 126 mg/dL — AB (ref 65–99)
Glucose-Capillary: 93 mg/dL (ref 65–99)
Glucose-Capillary: 115 mg/dL — ABNORMAL HIGH (ref 65–99)
Glucose-Capillary: 115 mg/dL — ABNORMAL HIGH (ref 65–99)

## 2016-03-18 LAB — PROTIME-INR
INR: 3.33
PROTHROMBIN TIME: 34.6 s — AB (ref 11.4–15.2)

## 2016-03-18 MED ORDER — LEVOFLOXACIN 750 MG PO TABS
750.0000 mg | ORAL_TABLET | Freq: Once | ORAL | Status: AC
Start: 2016-03-18 — End: 2016-03-18
  Administered 2016-03-18: 750 mg via ORAL
  Filled 2016-03-18: qty 1

## 2016-03-18 MED ORDER — WARFARIN SODIUM 2 MG PO TABS
2.0000 mg | ORAL_TABLET | Freq: Every day | ORAL | Status: DC
  Administered 2016-03-18: 2 mg via ORAL
  Filled 2016-03-18: qty 1

## 2016-03-18 MED ORDER — LEVOFLOXACIN 500 MG PO TABS
500.0000 mg | ORAL_TABLET | ORAL | Status: DC
  Administered 2016-03-20: 500 mg via ORAL
  Filled 2016-03-18: qty 1

## 2016-03-18 NOTE — Care Management Important Message (Signed)
Important Message  Patient Details  Name: [name] MRN: [id number] Date of Birth: 1955/12/03   Medicare Important Message Given:  Yes    Johnnetta Holstine 03/18/2016, 1:15 PM

## 2016-03-18 NOTE — Progress Notes (Signed)
[facility] Progress Note   Dialysis Orders: [name] MWF 3h 13min 59.5kg Hep none L fem AVG Po calcitriol 0.21mcg /HD MIrcera 60 mcg q 2wks (was to start 03/05/16) Other op labs hgb 9.8 Ca 9.3 phos 8.7 PTH 1294  Assessment/Plan:  1. PAD/ L foot ischemia: s/pL thigh AVG ligation to hopefully improve wound healing on the left heel. 2. Fever/possible PNA: BCx 1/26 1 of 2 CoNS, likely contaminant. BCx 1/28 negative. S/p 7d course of Vanc/Cefepime tmax 99.9 3. ESRD: HD MWF, s/pL thigh AVG ligation 1/30w/ right IJ cath insertion by [facility]. "If her overall condition improves will place right thigh graft a later interval" per [facility] consult note. 4. Aflutter/RVR/AVR: on heparin/warfarin, pharmacy dosing.INR 3.1 on Monday 5. Chest Pain /CAD: Cardiology consulted, felt to be "demand ischemia." 6. BP/Volume : CXR 1/31 with volume excess. Net UF 3 L on Monday with post wt 60.8- still above- expect she has lost wt given poor intake - BP variable  EDW - keep at usual EDW at d/c though may need to be lowered; plan titrate volume down more if here Wed 7. Anemia: Hgb 8.4with low tsat. On Aranesp weekly 100/1/31 and IV iron.- finish a course after d/c  8. 2HPTH: Continuevit D/ phoslo.-Ca/P ok 9. Hx Liver transplant (d/t Hep C): Per primary. 10. Type 2 DM: per primary.  11. COPD 12. Severe protein malnutrition: Alb 1.7, on supplements.- needs liberalized diet with prn salt to promote intake - after d/c 13. Goals of care: seen by Palliative Care; multiple medical issues; DNR/doesn't want aggressive measures. 54. DIspo - DNR/working on d/c to SNF - dialysis related info sent to her dialysis center ; plan HD first round Wed if not d/c today  Myriam Jacobson, PA-C [place] [phone] 03/18/2016,8:36 AM  LOS: 12 days   Pt seen, examined and agree w A/P as above.  [name] MD [name] pager [phone]   03/18/2016, 11:01  AM    Subjective:   Can't eat foot without salt (I got her some!)- on regular diet but no salt on tray Objective Vitals:   03/17/16 1820 03/17/16 2037 03/17/16 2146 03/18/16 0537  BP: (!) 157/70  (!) 147/63 (!) 108/54  Pulse: 98  84 85  Resp: 18  18 16   Temp: 99.9 F (37.7 C)  98.8 F (37.1 C) 99.1 F (37.3 C)  TempSrc: Oral     SpO2: 99%  100% 99%  Weight:  55.5 kg (122 lb 5.7 oz)    Height:       Physical Exam General:NAD Heart: RRR Lungs: grossly clear Abdomen: soft NT Extremities: no sig edema dressings on feet ulcerations Dialysis Access:  Right IJ and left ligated thigh graft   Additional Objective Labs: Basic Metabolic Panel:  Recent Labs Lab 03/12/16 0702 03/14/16 0600 03/17/16 0738  NA 135 132* 130*  K 3.4* 4.0 4.6  CL 98* 96* 97*  CO2 30 27 23   GLUCOSE 64* 60* 108*  BUN 14 27* 38*  CREATININE 5.06* 5.16* 7.02*  CALCIUM 8.6* 8.8* 8.5*  PHOS 4.9* 4.2 4.2   Liver Function Tests:  Recent Labs Lab 03/12/16 0702 03/14/16 0600 03/17/16 0738  AST 29  --   --   ALT 13*  --   --   ALKPHOS 235*  --   --   BILITOT 1.3*  --   --   PROT 5.6*  --   --   ALBUMIN 1.7* 1.7* 1.7*   No  results for input(s): LIPASE, AMYLASE in the last 168 hours. CBC:  Recent Labs Lab 03/13/16 0551 03/14/16 0500 03/15/16 0436 03/16/16 0450 03/17/16 0523  WBC 14.6* 18.4* 19.3* 17.5* 28.7*  NEUTROABS  --   --   --   --  25.3*  HGB 8.2* 8.1* 9.0* 8.0* 8.4*  HCT 25.4* 25.0* 27.2* 25.1* 25.6*  MCV 98.4 97.3 97.8 97.3 98.1  PLT 195 229 273 260 278   Blood Culture    Component Value Date/Time   SDES BLOOD RIGHT HAND 03/09/2016 1208   SPECREQUEST IN PEDIATRIC BOTTLE 2.5CC 03/09/2016 1208   CULT NO GROWTH 5 DAYS 03/09/2016 1208   REPTSTATUS 03/14/2016 FINAL 03/09/2016 1208    Cardiac Enzymes: No results for input(s): CKTOTAL, CKMB, CKMBINDEX, TROPONINI in the last 168 hours. CBG:  Recent Labs Lab 03/16/16 2113 03/17/16 1544 03/17/16 1745 03/17/16 2229  03/18/16 0741  GLUCAP 122* 99 93 137* 93   Iron Studies: No results for input(s): IRON, TIBC, TRANSFERRIN, FERRITIN in the last 72 hours. Lab Results  Component Value Date   INR 3.10 03/17/2016   INR 1.91 03/16/2016   INR 1.57 03/15/2016   Studies/Results: No results found. Medications: . sodium chloride 10 mL/hr at 03/11/16 1324   . calcitRIOL  0.25 mcg Oral Q M,W,F-1800  . calcium acetate  667 mg Oral TID WC  . darbepoetin (ARANESP) injection - DIALYSIS  100 mcg Intravenous Q Wed-HD  . feeding supplement (NEPRO CARB STEADY)  237 mL Oral BID BM  . feeding supplement (PRO-STAT SUGAR FREE 64)  30 mL Oral BID  . ferric gluconate (FERRLECIT/NULECIT) IV  250 mg Intravenous Q M,W,F-HD  . gabapentin  300 mg Oral QPM  . insulin aspart  0-5 Units Subcutaneous QHS  . insulin aspart  0-9 Units Subcutaneous TID WC  . insulin detemir  5 Units Subcutaneous QHS  . levothyroxine  175 mcg Oral QAC breakfast  . metoprolol tartrate  25 mg Oral BID  . multivitamin  1 tablet Oral QHS  . polyethylene glycol  17 g Oral Daily  . senna-docusate  1 tablet Oral Daily  . sodium chloride flush  3 mL Intravenous Q12H  . tacrolimus  2 mg Oral BID  . Warfarin - Pharmacist Dosing Inpatient   Does not apply [phone]

## 2016-03-18 NOTE — Progress Notes (Addendum)
ANTICOAGULATION CONSULT NOTE - Follow Up Consult  Pharmacy Consult for Warfarin Indication: mechanical AVR, atrial fibrillation  Allergies  Allergen Reactions  . Acetaminophen Other (See Comments)    Liver transplant recipient   . Codeine Itching  . Mirtazapine Other (See Comments)    hallucination  . Morphine Itching and Other (See Comments)    hallucinate    Patient Measurements: Height: 5\' 4"  (162.6 cm) Weight: 122 lb 5.7 oz (55.5 kg) IBW/kg (Calculated) : 54.7 Heparin Dosing Weight: 59 kg  Vital Signs: Temp: 99.1 F (37.3 C) (02/06 0537) BP: 108/54 (02/06 0537) Pulse Rate: 85 (02/06 0537)  Labs:  Recent Labs  03/15/16 1543 03/16/16 0450 03/17/16 0523 03/17/16 0738  HGB  --  8.0* 8.4*  --   HCT  --  25.1* 25.6*  --   PLT  --  260 278  --   LABPROT  --  22.1* 32.6*  --   INR  --  1.91 3.10  --   HEPARINUNFRC 0.50 0.37 0.56  --   CREATININE  --   --   --  7.02*    Estimated Creatinine Clearance: 7.3 mL/min (by C-G formula based on SCr of 7.02 mg/dL (H)).   Assessment: 61 yo female on heparin>warfarin bridge for atrial flutter and h/o mechanical AVR. Pt was on warfarin PTA which was held for procedure while inpatient initially. Procedure completed 1/30 and warfarin resumed. PTA dose warfarin 2 mg daily.   INR made large jump yesterday- 1.91>3.1. INR not yet drawn this morning, however anticipate patient to be discharged today. Hgb 8.4, plts 278- no overt bleeding noted.   Goal of Therapy:  INR goal 2.5 - 3 per outpatient anticoagulation notes Heparin level 0.3-0.7 units/ml Monitor platelets by anticoagulation protocol: Yes   Plan:  -Restart warfarin 2mg  daily with an INR check on 2/8 or 2/9. *dose can be given to patient prior to discharge on 2/6 as long as it is clear she does not need another dose after leaving the hospital -Daily INR and CBC while in the hospital -Monitor s/sx bleeding  Soley Harriss D. Shalice Woodring, PharmD, BCPS Clinical Pharmacist Pager:  [phone] 03/18/2016 8:43 AM

## 2016-03-18 NOTE — Progress Notes (Signed)
PROGRESS NOTE  [name]  [id number] DOB: [birth year] DOA: 03/05/2016 PCP: No PCP Per Patient Outpatient Specialists:  Subjective: Ready for discharge in a.m. yesterday, developed SVT after dialysis, discharge postponed. New onset of low-grade fever of 100.4, check chest x-ray and blood cultures. Start antibiotics empirically.  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 Presents to ED on 1/24 from HD with A.Flutter HR of 180. Patient reported left foot pain in which she is being followed by vascular. Upon arrival to ED patient reports chest pain and had pos troponins as well as a small left pleural effusion on chest xray. Cardiology was consulted and believed that elevated trops were due to ischemic demand in setting of a possible infection. During stay patient has been febrile, lactic acid 1.5, and pending blood cultures. Patient was admitted to step-down under [facility]. On 1/26 patient became increasingly lethargic and hypotensive. [facility] M was consulted and patient transferred to ICU. Patient did not wish to be intubated on BiPAP. Vascular surgery removed the left thigh graft and placed new dialysis. Initially, patient and family refused  the procedure. Was about to be discharged yesterday, developed SVT and low-grade fever   Assessment & Plan:   Active Problems:   Hypertensive heart disease   GERD   End stage renal disease on dialysis-TTS   CAD (coronary artery disease), native coronary artery   History of prosthetic aortic valve replacement   COPD (chronic obstructive pulmonary disease) (HCC)   Chronic anticoagulation w/ coumadin, goal INR 2.5-3.0 w/ AVR   S/P liver transplant ([place])   Chronic hepatitis C without hepatic coma (HCC)   Pressure injury of skin, stage 2   Elevated troponin   Claudication of left lower extremity (HCC)   Sepsis (HCC)   Hypotension   Goals of care,  counseling/discussion   Palliative care by specialist   Sepsis -This is present on admission, patient did have fever of 103, WBCs of 15.2 and presence of pneumonia and admission. -Lactic acid was elevated at 2.5. -There is is likely secondary to healthcare associated pneumonia. Blood cultures negative (contaminant) -Sepsis physiology resolved, developed fever yesterday, placed back on antibiotics.  Fever -Low-grade fever of 100.4, patient leukocytosis is worsening. -Discharge postponed, start antibiotics and CXR/blood cultures.  Chest pain/CAD -Patient has significant history of CAD, troponin was elevated. Cardiology was consulted, initially nuclear Myoview was planned but canceled by cardiology. -Chest pain resolved, no additional workup is recommended by cardiology.  Coagulase negative staph bacteremia -1/2 Blood cultures from 1/26 showed coagulase-negative staph likely contaminant. -Repeat cultures from 1/28 is still NGTD. Was on vancomycin for HCAP.  Healthcare associated pneumonia -Patient diagnosed with healthcare associated pneumonia, started on vancomycin and cefepime.  -Continue supportive management with bronchodilators, mucolytics and oxygen as needed. -Today is day 7 of vancomycin and cefepime will discontinue.  Leukocytosis. -She has persistent leukocytosis was 17.2 on admission. -WBC is 28.6 yesterday, check an a.m.  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 Continue Aranesp  Diabetes mellitus- blood glucose is stable, continue sliding scale insulin with NovoLog.  Chronic left lower extremity pain-patient was seen by vascular surgery, initially there was planned to remove the left thigh graft and replaced with high new left thigh graft and catheter. Patient and family earlier had refused the procedure, yesterday morning  patient told palliative  care that she wants to have the procedure.Vascular surgery was  re consulted, plan to undergo  ligation of left AV graft. She continues to have pain every now and then.  COPD- Stable. Continue duo nebs when necessary  ESRD on hemodialysis- followed by nephrology, on hemodialysis Tuesday Thursday and Saturday.  Chronic immunosuppression in the setting of liver transplantation/Hepatitis C- continue Prograf  Hypothyroidism-TSH 4.836, continue Synthroid.  Chronic anticoagulation -She is on Coumadin for mechanical aortic valve. -Currently she is on heparin drip, Coumadin restarted, INR is 1.57.  Status post liver transplant -On Prograf continued.  DVT prophylaxis: Heparin drip, will restart Coumadin Code Status: DNR Family Communication:  Disposition Plan: Skilled nursing facility, barrier to discharge needs therapeutic INR. Diet: Diet regular Room service appropriate? Yes; Fluid consistency: Thin; Fluid restriction: 1200 mL Fluid Diet - low sodium heart healthy  Consultants:   Nephrology  Procedures:   None  Antimicrobials:   Cefepime and vancomycin discontinued.  Objective: Vitals:   03/17/16 2146 03/18/16 0537 03/18/16 0820 03/18/16 1103  BP: (!) 147/63 (!) 108/54 (!) 128/48 (!) 113/50  Pulse: 84 85 86 84  Resp: 18 16 16    Temp: 98.8 F (37.1 C) 99.1 F (37.3 C) 99.6 F (37.6 C)   TempSrc:   Oral   SpO2: 100% 99% 96%   Weight:      Height:        Intake/Output Summary (Last 24 hours) at 03/18/16 1339 Last data filed at 03/18/16 0926  Gross per 24 hour  Intake              300 ml  Output                0 ml  Net              300 ml   Filed Weights   03/17/16 0720 03/17/16 1247 03/17/16 2037  Weight: 63.3 kg (139 lb 8.8 oz) 60.8 kg (134 lb 0.6 oz) 55.5 kg (122 lb 5.7 oz)    Examination: General exam: Appears calm and comfortable  Respiratory system: Clear to auscultation. Respiratory effort normal. Cardiovascular system: S1 & S2 heard, RRR. No JVD, murmurs, rubs, gallops or clicks. No pedal edema. Gastrointestinal system: Abdomen is  nondistended, soft and nontender. No organomegaly or masses felt. Normal bowel sounds heard. Central nervous system: Alert and oriented. No focal neurological deficits. Extremities: Symmetric 5 x 5 power. Skin: No rashes, lesions or ulcers Psychiatry: Judgement and insight appear normal. Mood & affect appropriate.   Data Reviewed: I have personally reviewed following labs and imaging studies  CBC:  Recent Labs Lab 03/13/16 0551 03/14/16 0500 03/15/16 0436 03/16/16 0450 03/17/16 0523  WBC 14.6* 18.4* 19.3* 17.5* 28.7*  NEUTROABS  --   --   --   --  25.3*  HGB 8.2* 8.1* 9.0* 8.0* 8.4*  HCT 25.4* 25.0* 27.2* 25.1* 25.6*  MCV 98.4 97.3 97.8 97.3 98.1  PLT 195 229 273 260 0000000   Basic Metabolic Panel:  Recent Labs Lab 03/11/16 1821 03/12/16 0702 03/14/16 0600 03/17/16 0738  NA 133* 135 132* 130*  K 3.4* 3.4* 4.0 4.6  CL 96* 98* 96* 97*  CO2 30 30 27 23   GLUCOSE 99 64* 60* 108*  BUN 10 14 27* 38*  CREATININE 4.27* 5.06* 5.16* 7.02*  CALCIUM 8.5* 8.6* 8.8* 8.5*  PHOS 4.7* 4.9* 4.2 4.2   GFR: Estimated Creatinine Clearance: 7.3 mL/min (by C-G formula based on SCr of 7.02 mg/dL (H)). Liver Function Tests:  Recent Labs Lab 03/11/16 1821 03/12/16 0702 03/14/16 0600  03/17/16 0738  AST  --  29  --   --   ALT  --  13*  --   --   ALKPHOS  --  235*  --   --   BILITOT  --  1.3*  --   --   PROT  --  5.6*  --   --   ALBUMIN 1.6* 1.7* 1.7* 1.7*   No results for input(s): LIPASE, AMYLASE in the last 168 hours. No results for input(s): AMMONIA in the last 168 hours. Coagulation Profile:  Recent Labs Lab 03/14/16 0500 03/15/16 0436 03/16/16 0450 03/17/16 0523 03/18/16 0840  INR 1.35 1.57 1.91 3.10 3.33   Cardiac Enzymes: No results for input(s): CKTOTAL, CKMB, CKMBINDEX, TROPONINI in the last 168 hours. BNP (last 3 results) No results for input(s): PROBNP in the last 8760 hours. HbA1C: No results for input(s): HGBA1C in the last 72 hours. CBG:  Recent Labs Lab  03/17/16 1544 03/17/16 1745 03/17/16 2229 03/18/16 0741 03/18/16 1135  GLUCAP 99 93 137* 93 115*   Lipid Profile: No results for input(s): CHOL, HDL, LDLCALC, TRIG, CHOLHDL, LDLDIRECT in the last 72 hours. Thyroid Function Tests: No results for input(s): TSH, T4TOTAL, FREET4, T3FREE, THYROIDAB in the last 72 hours. Anemia Panel: No results for input(s): VITAMINB12, FOLATE, FERRITIN, TIBC, IRON, RETICCTPCT in the last 72 hours. Urine analysis:    Component Value Date/Time   COLORURINE YELLOW 11/02/2012 0428   APPEARANCEUR CLOUDY (A) 11/02/2012 0428   LABSPEC 1.011 11/02/2012 0428   PHURINE 8.0 11/02/2012 0428   GLUCOSEU 250 (A) 11/02/2012 0428   HGBUR LARGE (A) 11/02/2012 0428   BILIRUBINUR NEGATIVE 11/02/2012 0428   KETONESUR NEGATIVE 11/02/2012 0428   PROTEINUR >300 (A) 11/02/2012 0428   UROBILINOGEN 0.2 11/02/2012 0428   NITRITE NEGATIVE 11/02/2012 0428   LEUKOCYTESUR NEGATIVE 11/02/2012 0428   Sepsis Labs: @LABRCNTIP (procalcitonin:4,lacticidven:4)  ) Recent Results (from the past 240 hour(s))  Culture, blood (routine x 2)     Status: None   Collection Time: 03/09/16 11:59 AM  Result Value Ref Range Status   Specimen Description BLOOD HEMODIALYSIS CATHETER  Final   Special Requests BOTTLES DRAWN AEROBIC AND ANAEROBIC 10CC EAC  Final   Culture NO GROWTH 5 DAYS  Final   Report Status 03/14/2016 FINAL  Final  Culture, blood (routine x 2)     Status: None   Collection Time: 03/09/16 12:08 PM  Result Value Ref Range Status   Specimen Description BLOOD RIGHT HAND  Final   Special Requests IN PEDIATRIC BOTTLE 2.5CC  Final   Culture NO GROWTH 5 DAYS  Final   Report Status 03/14/2016 FINAL  Final  Surgical pcr screen     Status: None   Collection Time: 03/10/16 11:11 PM  Result Value Ref Range Status   MRSA, PCR NEGATIVE NEGATIVE Final   Staphylococcus aureus NEGATIVE NEGATIVE Final    Comment:        The Xpert SA Assay (FDA approved for NASAL specimens in patients  over 32 years of age), is one component of a comprehensive surveillance program.  Test performance has been validated by [facility] for patients greater than or equal to 45 year old. It is not intended to diagnose infection nor to guide or monitor treatment.   MRSA PCR Screening     Status: None   Collection Time: 03/11/16  1:00 PM  Result Value Ref Range Status   MRSA by PCR NEGATIVE NEGATIVE Final    Comment:  The GeneXpert MRSA Assay (FDA approved for NASAL specimens only), is one component of a comprehensive MRSA colonization surveillance program. It is not intended to diagnose MRSA infection nor to guide or monitor treatment for MRSA infections.      Invalid input(s): PROCALCITONIN, [place]   Radiology Studies: No results found.      Scheduled Meds: . calcitRIOL  0.25 mcg Oral Q M,W,F-1800  . calcium acetate  667 mg Oral TID WC  . darbepoetin (ARANESP) injection - DIALYSIS  100 mcg Intravenous Q Wed-HD  . feeding supplement (NEPRO CARB STEADY)  237 mL Oral BID BM  . feeding supplement (PRO-STAT SUGAR FREE 64)  30 mL Oral BID  . ferric gluconate (FERRLECIT/NULECIT) IV  250 mg Intravenous Q M,W,F-HD  . gabapentin  300 mg Oral QPM  . insulin aspart  0-5 Units Subcutaneous QHS  . insulin aspart  0-9 Units Subcutaneous TID WC  . insulin detemir  5 Units Subcutaneous QHS  . levothyroxine  175 mcg Oral QAC breakfast  . metoprolol tartrate  25 mg Oral BID  . multivitamin  1 tablet Oral QHS  . polyethylene glycol  17 g Oral Daily  . senna-docusate  1 tablet Oral Daily  . sodium chloride flush  3 mL Intravenous Q12H  . tacrolimus  2 mg Oral BID  . warfarin  2 mg Oral q1800  . Warfarin - Pharmacist Dosing Inpatient   Does not apply q1800   Continuous Infusions: . sodium chloride 10 mL/hr at 03/11/16 1324     LOS: 12 days    Time spent: 35 minutes    Tacey Dimaggio A, MD [facility] Pager [phone]  If 7PM-7AM, please contact  night-coverage [email or web address] Password [email or web address] 03/18/2016, 1:39 PM

## 2016-03-18 NOTE — Clinical Social Work Note (Signed)
CSW talked with daughter Mechele Claude by phone and confirmed that patient will discharge home when medically stable. Ms. Josiah Lobo explained that she is home during the week and works on weekends, but can rearrange her schedule as needed to be with her mother. CSW will continue to follow through discharge.  Milbern Doescher Givens, MSW, LCSW Licensed Clinical Social Worker [place] [phone]

## 2016-03-18 NOTE — Progress Notes (Signed)
Daily Progress Note   Patient Name: [name]       Date: 03/18/2016 DOB: 1955-10-19  Age: 61 y.o. MRN#: [id number] Attending Physician: Verlee Monte, MD Primary Care Physician: No PCP Per Patient Admit Date: 03/05/2016  Reason for Consultation/Follow-up: Establishing goals of care  Subjective: Noted patient's discharge delayed due to worsening leucocytosis, fever, and SVT. Spoke with [name] (patient's daughter) by phone. She became very tearful and is worried about the possibility of her mother being discharged home tomorrow morning. I discussed changes in patient's condition today and [place] requested f/u meeting for further discussion of [place].  Review of Systems  Unable to perform ROS: Other    Length of Stay: 12  Current Medications: Scheduled Meds:  . calcitRIOL  0.25 mcg Oral Q M,W,F-1800  . calcium acetate  667 mg Oral TID WC  . darbepoetin (ARANESP) injection - DIALYSIS  100 mcg Intravenous Q Wed-HD  . feeding supplement (NEPRO CARB STEADY)  237 mL Oral BID BM  . feeding supplement (PRO-STAT SUGAR FREE 64)  30 mL Oral BID  . ferric gluconate (FERRLECIT/NULECIT) IV  250 mg Intravenous Q M,W,F-HD  . gabapentin  300 mg Oral QPM  . insulin aspart  0-5 Units Subcutaneous QHS  . insulin aspart  0-9 Units Subcutaneous TID WC  . insulin detemir  5 Units Subcutaneous QHS  . [START ON 03/20/2016] levofloxacin  500 mg Oral Q48H  . levothyroxine  175 mcg Oral QAC breakfast  . metoprolol tartrate  25 mg Oral BID  . multivitamin  1 tablet Oral QHS  . polyethylene glycol  17 g Oral Daily  . senna-docusate  1 tablet Oral Daily  . sodium chloride flush  3 mL Intravenous Q12H  . tacrolimus  2 mg Oral BID  . warfarin  2 mg Oral q1800  . Warfarin - Pharmacist Dosing Inpatient   Does not  apply q1800    Continuous Infusions: . sodium chloride 10 mL/hr at 03/11/16 1324    PRN Meds: acetaminophen, acetaminophen, metoprolol, morphine injection, ondansetron (ZOFRAN) IV, ondansetron, oxyCODONE  Physical Exam  Constitutional: No distress.  Neurological:  Sleeping            Vital Signs: BP (!) 113/50   Pulse 84   Temp 99.6 F (37.6 C) (Oral)   Resp 16  Ht 5\' 4"  (1.626 m)   Wt 55.5 kg (122 lb 5.7 oz)   SpO2 96%   BMI 21.00 kg/m  SpO2: SpO2: 96 % O2 Device: O2 Device: Not Delivered O2 Flow Rate: O2 Flow Rate (L/min): 2 L/min  Intake/output summary:  Intake/Output Summary (Last 24 hours) at 03/18/16 1613 Last data filed at 03/18/16 1500  Gross per 24 hour  Intake              360 ml  Output                0 ml  Net              360 ml   LBM: Last BM Date: 03/18/16 Baseline Weight: Weight: 59 kg (130 lb) Most recent weight: Weight: 55.5 kg (122 lb 5.7 oz)       Palliative Assessment/Data: PPS: 50%    Flowsheet Rows   Flowsheet Row Most Recent Value  Intake Tab  Referral Department  Critical care  Unit at Time of Referral  ICU  Palliative Care Primary Diagnosis  Sepsis/Infectious Disease  Date Notified  03/08/16  Palliative Care Type  New Palliative care  Reason for referral  Clarify Goals of Care  Date of Admission  03/05/16  Date first seen by Palliative Care  03/09/16  # of days Palliative referral response time  1 Day(s)  # of days IP prior to Palliative referral  3  Clinical Assessment  Palliative Performance Scale Score  30%  Psychosocial & Spiritual Assessment  Palliative Care Outcomes  Patient/Family meeting held?  Yes  Who was at the meeting?  patient.  Called Dtr Nira Conn on phone  Palliative Care Outcomes  Clarified goals of care  Patient/Family wishes: Interventions discontinued/not started   Other (Comment)      Patient Active Problem List   Diagnosis Date Noted  . Goals of care, counseling/discussion   . Palliative care by  specialist   . Sepsis ([place])   . Hypotension   . Claudication of left lower extremity ([place])   . Elevated troponin 03/05/2016  . Pressure injury of skin, stage 2 11/27/2015  . H/O unilateral nephrectomy 07/06/2015  . Paroxysmal SVT (supraventricular tachycardia) ([place]) 06/22/2014  . Complications due to renal dialysis device, implant, and graft 05/11/2014  . Acute blood loss anemia 03/31/2014  . Renal dialysis device, implant, or graft complication [id number]  . Acute hyperkalemia   . S/P liver transplant ([place])   . Chronic hepatitis C without hepatic coma ([place])   . Pulmonary edema 01/03/2014  . Respiratory failure ([place]) 01/03/2014  . Long term current use of anticoagulant therapy 04/22/2013  . Chronic anticoagulation w/ coumadin, goal INR 2.5-3.0 w/ AVR 04/14/2013  . Tobacco abuse 02/28/2013  . COPD (chronic obstructive pulmonary disease) ([place]) 02/21/2013  . Chronic combined systolic and diastolic congestive heart failure ([place]) 02/06/2013  . Acute respiratory failure ([place]) 11/02/2012  . Acute pulmonary edema ([place]) 11/02/2012  . Acute on chronic systolic CHF (congestive heart failure) ([place]) 11/02/2012  . Dyslipidemia-LDL 104, not on statin with Hx of liver transplant 07/30/2012  . CAD (coronary artery disease), native coronary artery 07/27/2012  . History of prosthetic aortic valve replacement   . End stage renal disease on dialysis-TTS 02/27/2011  . Hypothyroidism 06/25/2006  . Type 2 diabetes mellitus with chronic kidney disease ([place]) 06/25/2006  . GERD 06/25/2006  . Anemia due to chronic renal failure    . Hypertensive heart disease     Palliative Care  Assessment & Plan   Patient Profile: 61 y.o.femalewith past medical history of liver transplant, DM2 w/ neuropathy, PVD, CAD s/p stent, ESRD on HD, AS, COPD, chronic back painwho was admitted on 1/24/2018with aflutter and sepsis. She was found to have staph bacteremia. Her troponin was elevated likely due to demand ischemia.  She was experiencing LLE pain. Vascular was consulted. They do not feel the foot is the source of her infection, but they did offer to ligate the left thigh graft and place a new left thigh graft and catheter on Monday. Patient initially refused surgery. However, since cardiology has cleared her, she desires surgery in hopes of decreasing pain. She has recovered well from surgery and was ready for discharge, however over the last 24 hours developed SVT, fever and worsening leucocytosis. Discharge delayed.   Assessment/Recommendations/Plan   Meeting with patient's daughter, [name] tomorrow morning at [place] and Additional Recommendations:  Limitations on Scope of Treatment: Full Scope Treatment  Code Status:  DNR  Prognosis:   Unable to determine  Discharge Planning:  To Be Determined  Care plan was discussed with [name] (patient's daughter)  Thank you for allowing the Palliative Medicine Team to assist in the care of this patient.   Total time: 15 minutes  Greater than 50%  of this time was spent counseling and coordinating care related to the above assessment and plan.  Mariana Kaufman, AGNP-C Palliative Medicine   Please contact Palliative Medicine Team phone at [phone] for questions and concerns.

## 2016-03-19 DIAGNOSIS — Z952 Presence of prosthetic heart valve: Secondary | ICD-10-CM

## 2016-03-19 DIAGNOSIS — Z7901 Long term (current) use of anticoagulants: Secondary | ICD-10-CM

## 2016-03-19 DIAGNOSIS — I739 Peripheral vascular disease, unspecified: Secondary | ICD-10-CM

## 2016-03-19 LAB — RENAL FUNCTION PANEL
Albumin: 1.6 g/dL — ABNORMAL LOW (ref 3.5–5.0)
Anion gap: 11 (ref 5–15)
BUN: 31 mg/dL — ABNORMAL HIGH (ref 6–20)
CO2: 27 mmol/L (ref 22–32)
Calcium: 8.3 mg/dL — ABNORMAL LOW (ref 8.9–10.3)
Chloride: 95 mmol/L — ABNORMAL LOW (ref 101–111)
Creatinine, Ser: 6.14 mg/dL — ABNORMAL HIGH (ref 0.44–1.00)
GFR calc Af Amer: 8 mL/min — ABNORMAL LOW (ref >60)
GFR calc non Af Amer: 7 mL/min — ABNORMAL LOW (ref >60)
Glucose, Bld: 90 mg/dL (ref 65–99)
Phosphorus: 4.5 mg/dL (ref 2.5–4.6)
Potassium: 3.3 mmol/L — ABNORMAL LOW (ref 3.5–5.1)
Sodium: 133 mmol/L — ABNORMAL LOW (ref 135–145)

## 2016-03-19 LAB — CBC
HCT: 23.4 % — ABNORMAL LOW (ref 36.0–46.0)
Hemoglobin: 7.5 g/dL — ABNORMAL LOW (ref 12.0–15.0)
MCH: 31.4 pg (ref 26.0–34.0)
MCHC: 32.1 g/dL (ref 30.0–36.0)
MCV: 97.9 fL (ref 78.0–100.0)
Platelets: 294 10*3/uL (ref 150–400)
RBC: 2.39 MIL/uL — ABNORMAL LOW (ref 3.87–5.11)
RDW: 17.3 % — ABNORMAL HIGH (ref 11.5–15.5)
WBC: 22.6 10*3/uL — ABNORMAL HIGH (ref 4.0–10.5)

## 2016-03-19 LAB — PROTIME-INR
INR: 3.05
Prothrombin Time: 32.2 seconds — ABNORMAL HIGH (ref 11.4–15.2)

## 2016-03-19 LAB — GLUCOSE, CAPILLARY
GLUCOSE-CAPILLARY: 53 mg/dL — AB (ref 65–99)
GLUCOSE-CAPILLARY: 69 mg/dL (ref 65–99)
Glucose-Capillary: 166 mg/dL — ABNORMAL HIGH (ref 65–99)
Glucose-Capillary: 137 mg/dL — ABNORMAL HIGH (ref 65–99)

## 2016-03-19 MED ORDER — ALTEPLASE 2 MG IJ SOLR: 2.0000 mg | Freq: Once | INTRAMUSCULAR | Status: DC | PRN

## 2016-03-19 MED ORDER — LIDOCAINE HCL (PF) 1 % IJ SOLN: 5.0000 mL | INTRAMUSCULAR | Status: DC | PRN

## 2016-03-19 MED ORDER — SODIUM CHLORIDE 0.9 % IV SOLN: 100.0000 mL | INTRAVENOUS | Status: DC | PRN

## 2016-03-19 MED ORDER — LIDOCAINE-PRILOCAINE 2.5-2.5 % EX CREA: 1.0000 "application " | TOPICAL_CREAM | CUTANEOUS | Status: DC | PRN

## 2016-03-19 MED ORDER — PENTAFLUOROPROP-TETRAFLUOROETH EX AERO: 1.0000 "application " | INHALATION_SPRAY | CUTANEOUS | Status: DC | PRN

## 2016-03-19 MED ORDER — DARBEPOETIN ALFA 150 MCG/0.3ML IJ SOSY
PREFILLED_SYRINGE | INTRAMUSCULAR | Status: AC
  Filled 2016-03-19: qty 0.3

## 2016-03-19 MED ORDER — INSULIN ASPART 100 UNIT/ML ~~LOC~~ SOLN
0.0000 [IU] | Freq: Three times a day (TID) | SUBCUTANEOUS | Status: DC
Start: 2016-03-19 — End: 2016-03-20
  Administered 2016-03-19: 2 [IU] via SUBCUTANEOUS
  Administered 2016-03-20: 1 [IU] via SUBCUTANEOUS

## 2016-03-19 MED ORDER — HEPARIN SODIUM (PORCINE) 1000 UNIT/ML DIALYSIS: 1000.0000 [IU] | INTRAMUSCULAR | Status: DC | PRN

## 2016-03-19 MED ORDER — DARBEPOETIN ALFA 150 MCG/0.3ML IJ SOSY
150.0000 ug | PREFILLED_SYRINGE | INTRAMUSCULAR | Status: DC
  Administered 2016-03-19: 150 ug via INTRAVENOUS

## 2016-03-19 MED ORDER — CALCITRIOL 0.25 MCG PO CAPS
ORAL_CAPSULE | ORAL | Status: AC
  Administered 2016-03-19: 0.25 ug via ORAL
  Filled 2016-03-19: qty 1

## 2016-03-19 NOTE — Progress Notes (Signed)
Physical Therapy Treatment Patient Details Name: [name] MRN: [id number] DOB: 1955/04/14 Today's Date: 03/19/2016    History of Present Illness  [name] is a 61 y.o. female with medical history significant end-stage renal disease on Tuesday Thursday Saturday dialysis, CAD with LIMA, AVR, liver transplant, hep C, COPD, diabetes, hypertension, SVT presents to the emergency Department chief complaint persistent/worsening left foot/leg pain and palpitations with chest pain. Initial evaluation reveals elevated troponin, leukocytosis and a flutter HR 180    PT Comments    Although L LE hurting as usual, pt able to increase ambulation significantly.  Follow Up Recommendations  Home health PT     Equipment Recommendations  None recommended by PT    Recommendations for Other Services       Precautions / Restrictions Precautions Precautions: Fall    Mobility  Bed Mobility Overal bed mobility: Needs Assistance (with rail) Bed Mobility: Sidelying to Sit   Sidelying to sit: Supervision       General bed mobility comments: light use of the rails  Transfers Overall transfer level: Needs assistance Equipment used: Rolling walker (2 wheeled) Transfers: Sit to/from Stand Sit to Stand: Min guard         General transfer comment: cues for hand placement  Ambulation/Gait Ambulation/Gait assistance: Min guard Ambulation Distance (Feet): 140 Feet Assistive device: Rolling walker (2 wheeled) Gait Pattern/deviations: Step-through pattern   Gait velocity interpretation: at or above normal speed for age/gender General Gait Details: antalgic, but steady   Science writer    Modified Rankin (Stroke Patients Only)       Balance Overall balance assessment: Needs assistance Sitting-balance support: No upper extremity supported Sitting balance-Leahy Scale: Good     Standing balance support: Bilateral upper extremity supported Standing  balance-Leahy Scale: Poor Standing balance comment: reliant on the RW                    Cognition Arousal/Alertness: Awake/alert Behavior During Therapy: [facility] for tasks assessed/performed;Flat affect Overall Cognitive Status: Within Functional Limits for tasks assessed                      Exercises General Exercises - Lower Extremity Heel Slides: AROM;Strengthening;Both;10 reps;Supine Straight Leg Raises: AROM;Strengthening;Both;10 reps;Supine Other Exercises Other Exercises: bridging x10 Other Exercises: bicep/tricep press    General Comments General comments (skin integrity, edema, etc.): sats 99%  on RA  EHR 84 bpm      Pertinent Vitals/Pain Pain Assessment: Faces Faces Pain Scale: Hurts even more Pain Location: L foot Pain Descriptors / Indicators: Sore;Grimacing Pain Intervention(s): Monitored during session;Limited activity within patient's tolerance;Repositioned    Home Living                      Prior Function            PT Goals (current goals can now be found in the care plan section) Acute Rehab PT Goals PT Goal Formulation: With patient Time For Goal Achievement: 03/25/16 Potential to Achieve Goals: Good Progress towards PT goals: Progressing toward goals    Frequency    Min 3X/week      PT Plan Current plan remains appropriate    Co-evaluation             End of Session   Activity Tolerance: Patient tolerated treatment well Patient left: in chair;with bed alarm set;with chair alarm set  Time: [id number] PT Time Calculation (min) (ACUTE ONLY): 25 min  Charges:  $Gait Training: 8-22 mins $Therapeutic Exercise: 8-22 mins                    G CodesTessie Fass Leimomi Zervas 03/19/2016, 5:08 PM 03/19/2016  [name], PT [phone]  (pager)

## 2016-03-19 NOTE — Progress Notes (Signed)
No charge note:  Daughter [name] did not show for meeting as scheduled this morning. Called her and left a message to reschedule.   Mariana Kaufman, AGNP-C Palliative Medicine  Please call Palliative Medicine team phone with any questions [phone]. For individual providers please see AMION.

## 2016-03-19 NOTE — Progress Notes (Signed)
[facility] Progress Note   Dialysis Orders: [name] MWF 3h 82min 59.5kg Hep none L fem AVG Po calcitriol 0.21mcg /HD MIrcera 60 mcg q 2wks (was to start 03/05/16) Other op labs hgb 9.8 Ca 9.3 phos 8.7 PTH 1294  Assessment/Plan: 1. PAD/ L foot ischemia: s/pL thigh AVG ligationto hopefully improve wound healing on the left heel.- given fevers and persistently ^ WBCand tenderness, consider xray of left heel 2. Fever/possible PNA: tmax 99,6 had worsening ^ WBC to 28K down some this am to 22.6 had  BCx 1/26 1 of 2 CoNS, likely contaminant. BCx 1/28 negative. S/p 7d course of Vanc/Cefepime - BC redrawn 2/6- CXR improved 2/6 3. ESRD: HD MWF,s/pL thigh AVG ligation 1/30w/ right IJ cath insertion by VVS. "If her overall condition improves will place right thigh graft a later interval" per [facility] consult note. K 3.3 low today - changed to 4 K bath 4. Aflutter/RVR/AVR: on heparin/warfarin, pharmacy dosing.INR 3.3 2/6 5. Chest Pain /CAD: Cardiology consulted, felt to be "demand ischemia." 6. BP/Volume : CXR 1/31 with volume excess. Net UF 3 L on Monday with post wt 60.8- still above- expect she has lost wt given poor intake - BP variable  EDW - keep at usual EDW at d/c though may need to be lowered; plan titrate volume down more if here Wed' Pre HD Wt 60 bed scale - goal 1.5 today 7. Anemia: Hgb 7.5with low tsat. On Aranesp weekly 100/1/31 increased to 150 today -drops in Hgb may be due to ESA and delay of starting ESA - had been off ESA PAT;  IV iron.-to finish Fe after d/c  8. 2HPTH: Continuevit D/ phoslo.-Ca/P ok 9. Hx Liver transplant (d/t Hep C): Per primary. 10. Type 2 DM: per primary.  11. COPD 12. Severe protein malnutrition: Alb 1.6, on supplements.- needs liberalized diet with prn salt to promote intake - after d/c 13. Goals of care: seen by Palliative Care; multiple medical issues; DNR/doesn't want aggressive measures; appreciate their assistance 14. DIspo -  DNR/working on d/c to home, not SNF    Myriam Jacobson, PA-C [place] [phone] 03/19/2016,8:44 AM  LOS: 13 days   Pt seen, examined, agree w assess/plan as above with additions as indicated. Seen on HD, no symptoms or exam findings to explain new WBC ^ to 26- 28K.  L foot still dark and tender , no wounds/ drainage or skin breakdown.  R chest HD cath intact w/o gross infection.  Feeling ok. Plan HD today.  [name] MD [facility] pager [phone]    cell [phone] 03/19/2016, 10:52 AM      Subjective:   Left heel still hurts - esp laterally.  Lots of stools after lax for constipation.  BP dropped so goal dropped -   Objective Vitals:   03/19/16 0712 03/19/16 0720 03/19/16 0745 03/19/16 0815  BP: 133/67 (!) 142/73 107/65 113/69  Pulse: 92 88 77 77  Resp: 17 17 15 14   Temp: 98.5 F (36.9 C)     TempSrc: Oral     SpO2:      Weight:      Height:       Physical Exam General: Heart: Lungs: Abdomen: Extremities: Dialysis Access:    Additional Objective Labs: Basic Metabolic Panel:  Recent Labs Lab 03/14/16 0600 03/17/16 0738 03/19/16 0727  NA 132* 130* 133*  K 4.0 4.6 3.3*  CL 96* 97* 95*  CO2 27 23 27   GLUCOSE 60* 108* 90  BUN 27* 38* 31*  CREATININE 5.16* 7.02* 6.14*  CALCIUM 8.8* 8.5* 8.3*  PHOS 4.2 4.2 4.5   Liver Function Tests:  Recent Labs Lab 03/14/16 0600 03/17/16 0738 03/19/16 0727  ALBUMIN 1.7* 1.7* 1.6*   CBC:  Recent Labs Lab 03/14/16 0500 03/15/16 0436 03/16/16 0450 03/17/16 0523 03/19/16 0727  WBC 18.4* 19.3* 17.5* 28.7* 22.6*  NEUTROABS  --   --   --  25.3*  --   HGB 8.1* 9.0* 8.0* 8.4* 7.5*  HCT 25.0* 27.2* 25.1* 25.6* 23.4*  MCV 97.3 97.8 97.3 98.1 97.9  PLT 229 273 260 278 294   Blood Culture    Component Value Date/Time   SDES BLOOD RIGHT HAND 03/09/2016 1208   SPECREQUEST IN PEDIATRIC BOTTLE 2.5CC 03/09/2016 1208   CULT NO GROWTH 5 DAYS 03/09/2016 1208   REPTSTATUS  03/14/2016 FINAL 03/09/2016 1208   CBG:  Recent Labs Lab 03/17/16 2229 03/18/16 0741 03/18/16 1135 03/18/16 1628 03/18/16 2100  GLUCAP 137* 93 115* 115* 126*    Lab Results  Component Value Date   INR 3.33 03/18/2016   INR 3.10 03/17/2016   INR 1.91 03/16/2016   Studies/Results: Dg Chest 2 View  Result Date: 03/18/2016 CLINICAL DATA:  Fever for several days, history of asthma, history of liver transplant, diabetes EXAM: CHEST  2 VIEW COMPARISON:  Chest x-ray of 03/12/2016 FINDINGS: Aeration of the lungs has improved. Small pleural effusions remain and only mild pulmonary vascular congestion is noted. Right central venous line tip overlies the lower SVC near the expected right atrial junction and aortic valve replacement is noted. Cardiomegaly is stable. No bony abnormality is seen. IMPRESSION: Improved aeration with improvement in CHF pattern. Small pleural effusions remain. Electronically Signed   By: Ivar Drape M.D.   On: 03/18/2016 15:22   Medications: . sodium chloride 10 mL/hr at 03/11/16 1324   . calcitRIOL  0.25 mcg Oral Q M,W,F-1800  . calcium acetate  667 mg Oral TID WC  . darbepoetin (ARANESP) injection - DIALYSIS  100 mcg Intravenous Q Wed-HD  . feeding supplement (NEPRO CARB STEADY)  237 mL Oral BID BM  . feeding supplement (PRO-STAT SUGAR FREE 64)  30 mL Oral BID  . ferric gluconate (FERRLECIT/NULECIT) IV  250 mg Intravenous Q M,W,F-HD  . gabapentin  300 mg Oral QPM  . insulin aspart  0-5 Units Subcutaneous QHS  . insulin aspart  0-9 Units Subcutaneous TID WC  . insulin detemir  5 Units Subcutaneous QHS  . [START ON 03/20/2016] levofloxacin  500 mg Oral Q48H  . levothyroxine  175 mcg Oral QAC breakfast  . metoprolol tartrate  25 mg Oral BID  . multivitamin  1 tablet Oral QHS  . polyethylene glycol  17 g Oral Daily  . senna-docusate  1 tablet Oral Daily  . sodium chloride flush  3 mL Intravenous Q12H  . tacrolimus  2 mg Oral BID  . warfarin  2 mg Oral q1800  .  Warfarin - Pharmacist Dosing Inpatient   Does not apply [phone]

## 2016-03-19 NOTE — Progress Notes (Signed)
[facility] PROGRESS NOTE  [name] [id number] DOB: February 21, 1955 DOA: 03/05/2016  PCP: No PCP Per Patient  Brief History/Interval Summary: 61 year old with PMH of ESRD on HD MWF, CAD with LIMA, AVR, liver transplant secondary to hep C, COPD, DM, and HTN. Presented to ED on 1/24 from HD with A.Flutter HR of 180. Patient reported left foot pain in which she is being followed by vascular. Upon arrival to ED patient reports chest pain and had pos troponins as well as a small left pleural effusion on chest xray. Cardiology was consulted and believed that elevated trops were due to ischemic demand in setting of a possible infection. During stay patient has been febrile, lactic acid 1.5, and pending blood cultures. Patient was admitted to step-down. On 1/26 patient became increasingly lethargic and hypotensive. PCCM was consulted and patient transferred to ICU. Patient did not wish to be intubated on BiPAP. Vascular surgery removed the left thigh graft and placed new dialysis. Initially, patient and family refused the procedure. Was about to be discharged 2/6, developed SVT and low-grade fever.   Reason for Visit: Sepsis  Consultants: Nephrology. Vascular surgery. Palliative medicine  Procedures:  1. Ultrasound-guided placement of right IJ tunneled dialysis catheter (23 cm) 2. Ligation of left thigh AV graft  Antibiotics: Initially on vancomycin and cefepime. Now on Levaquin  Subjective/Interval History: Patient seen at dialysis. She denies any new complaints. Continues to have some pain in her left foot. Denies any cough.  ROS: No chest pain, shortness of breath.  Objective:  Vital Signs  Vitals:   03/19/16 1015 03/19/16 1045 03/19/16 1107 03/19/16 1219  BP: 126/65 134/70 139/70 126/66  Pulse: 79 81 80 77  Resp: 14 15 16 16   Temp:   98.3 F (36.8 C) 98 F (36.7 C)  TempSrc:   Oral Oral  SpO2:   98% 98%  Weight:   56.4 kg (124 lb 5.4 oz)   Height:         Intake/Output Summary (Last 24 hours) at 03/19/16 1601 Last data filed at 03/19/16 1220  Gross per 24 hour  Intake              490 ml  Output             1300 ml  Net             -810 ml   Filed Weights   03/18/16 2101 03/19/16 0700 03/19/16 1107  Weight: 56.7 kg (125 lb) 57.7 kg (127 lb 3.3 oz) 56.4 kg (124 lb 5.4 oz)    General appearance: alert, cooperative, appears stated age and no distress Resp: clear to auscultation bilaterally Cardio: regular rate and rhythm, S1, S2 normal, no murmur, click, rub or gallop GI: soft, non-tender; bowel sounds normal; no masses,  no organomegaly Extremities: extremities normal, atraumatic, no cyanosis or edema Neurologic: Awake and alert. No focal deficits.  Lab Results:  Data Reviewed: I have personally reviewed following labs and imaging studies  CBC:  Recent Labs Lab 03/14/16 0500 03/15/16 0436 03/16/16 0450 03/17/16 0523 03/19/16 0727  WBC 18.4* 19.3* 17.5* 28.7* 22.6*  NEUTROABS  --   --   --  25.3*  --   HGB 8.1* 9.0* 8.0* 8.4* 7.5*  HCT 25.0* 27.2* 25.1* 25.6* 23.4*  MCV 97.3 97.8 97.3 98.1 97.9  PLT 229 273 260 278 [id number]    Basic Metabolic Panel:  Recent Labs Lab 03/14/16 0600 03/17/16 0738 03/19/16 0727  NA 132* 130*  133*  K 4.0 4.6 3.3*  CL 96* 97* 95*  CO2 27 23 27   GLUCOSE 60* 108* 90  BUN 27* 38* 31*  CREATININE 5.16* 7.02* 6.14*  CALCIUM 8.8* 8.5* 8.3*  PHOS 4.2 4.2 4.5    GFR: Estimated Creatinine Clearance: 8.3 mL/min (by C-G formula based on SCr of 6.14 mg/dL (H)).  Liver Function Tests:  Recent Labs Lab 03/14/16 0600 03/17/16 0738 03/19/16 0727  ALBUMIN 1.7* 1.7* 1.6*    Coagulation Profile:  Recent Labs Lab 03/15/16 0436 03/16/16 0450 03/17/16 0523 03/18/16 0840 03/19/16 1220  INR 1.57 1.91 3.10 3.33 3.05    CBG:  Recent Labs Lab 03/18/16 1135 03/18/16 1628 03/18/16 2100 03/19/16 1209 03/19/16 1233  GLUCAP 115* 115* 126* 53* 69     Recent Results (from the past  240 hour(s))  Surgical pcr screen     Status: None   Collection Time: 03/10/16 11:11 PM  Result Value Ref Range Status   MRSA, PCR NEGATIVE NEGATIVE Final   Staphylococcus aureus NEGATIVE NEGATIVE Final    Comment:        The Xpert SA Assay (FDA approved for NASAL specimens in patients over 80 years of age), is one component of a comprehensive surveillance program.  Test performance has been validated by [facility] for patients greater than or equal to 30 year old. It is not intended to diagnose infection nor to guide or monitor treatment.   MRSA PCR Screening     Status: None   Collection Time: 03/11/16  1:00 PM  Result Value Ref Range Status   MRSA by PCR NEGATIVE NEGATIVE Final    Comment:        The GeneXpert MRSA Assay (FDA approved for NASAL specimens only), is one component of a comprehensive MRSA colonization surveillance program. It is not intended to diagnose MRSA infection nor to guide or monitor treatment for MRSA infections.   Culture, blood (Routine X 2) w Reflex to ID Panel     Status: None (Preliminary result)   Collection Time: 03/18/16  4:50 PM  Result Value Ref Range Status   Specimen Description BLOOD RIGHT HAND  Final   Special Requests BOTTLES DRAWN AEROBIC ONLY 5CC  Final   Culture NO GROWTH < 24 HOURS  Final   Report Status PENDING  Incomplete  Culture, blood (Routine X 2) w Reflex to ID Panel     Status: None (Preliminary result)   Collection Time: 03/18/16  4:55 PM  Result Value Ref Range Status   Specimen Description BLOOD RIGHT HAND  Final   Special Requests BOTTLES DRAWN AEROBIC ONLY 5CC  Final   Culture NO GROWTH < 24 HOURS  Final   Report Status PENDING  Incomplete      Radiology Studies: Dg Chest 2 View  Result Date: 03/18/2016 CLINICAL DATA:  Fever for several days, history of asthma, history of liver transplant, diabetes EXAM: CHEST  2 VIEW COMPARISON:  Chest x-ray of 03/12/2016 FINDINGS: Aeration of the lungs has improved. Small  pleural effusions remain and only mild pulmonary vascular congestion is noted. Right central venous line tip overlies the lower SVC near the expected right atrial junction and aortic valve replacement is noted. Cardiomegaly is stable. No bony abnormality is seen. IMPRESSION: Improved aeration with improvement in CHF pattern. Small pleural effusions remain. Electronically Signed   By: Ivar Drape M.D.   On: 03/18/2016 15:22     Medications:  Scheduled: . calcitRIOL  0.25 mcg Oral Q M,W,F-1800  .  calcium acetate  667 mg Oral TID WC  . darbepoetin (ARANESP) injection - DIALYSIS  150 mcg Intravenous Q Wed-HD  . feeding supplement (NEPRO CARB STEADY)  237 mL Oral BID BM  . feeding supplement (PRO-STAT SUGAR FREE 64)  30 mL Oral BID  . ferric gluconate (FERRLECIT/NULECIT) IV  250 mg Intravenous Q M,W,F-HD  . gabapentin  300 mg Oral QPM  . insulin aspart  0-5 Units Subcutaneous QHS  . insulin aspart  0-9 Units Subcutaneous TID WC  . insulin detemir  5 Units Subcutaneous QHS  . [START ON 03/20/2016] levofloxacin  500 mg Oral Q48H  . levothyroxine  175 mcg Oral QAC breakfast  . metoprolol tartrate  25 mg Oral BID  . multivitamin  1 tablet Oral QHS  . senna-docusate  1 tablet Oral Daily  . sodium chloride flush  3 mL Intravenous Q12H  . tacrolimus  2 mg Oral BID  . Warfarin - Pharmacist Dosing Inpatient   Does not apply q1800   Continuous: . sodium chloride 10 mL/hr at 03/11/16 1324   [id number], acetaminophen, metoprolol, morphine injection, ondansetron (ZOFRAN) IV, ondansetron, oxyCODONE  Assessment/Plan:  Active Problems:   Hypertensive heart disease   GERD   End stage renal disease on dialysis-TTS   CAD (coronary artery disease), native coronary artery   History of prosthetic aortic valve replacement   COPD (chronic obstructive pulmonary disease) (HCC)   Chronic anticoagulation w/ coumadin, goal INR 2.5-3.0 w/ AVR   S/P liver transplant ([place])   Chronic hepatitis C without  hepatic coma (HCC)   Pressure injury of skin, stage 2   Elevated troponin   Claudication of left lower extremity (HCC)   Sepsis (HCC)   Hypotension   Goals of care, counseling/discussion   Palliative care by specialist   Fever   Advance care planning    Sepsis -This was present on admission, patient did have fever of 103, WBCs of 15.2 and presence of pneumonia and admission. -Lactic acid was elevated at 2.5. -There is is likely secondary to healthcare associated pneumonia. Blood cultures negative (contaminant) -Sepsis physiology initially resolved. Patient developed fever again on 2/5. She was placed back on antibiotics. Afebrile since in. Repeat blood cultures are negative so far. WBC is decreasing.  Fever -She developed Low-grade fever of 100.4, patient leukocytosis was noted to be worsening. -Discharge was postponed, started on antibiotics and CXR/blood cultures. WBC improved this morning. Continue to monitor for additional 24 hours.  Chest pain/CAD -Patient has significant history of CAD, troponin was elevated. Cardiology was consulted, initially nuclear Myoview was planned but canceled by cardiology. -Chest pain resolved, no additional workup is recommended by cardiology.  Coagulase negative staph bacteremia -1/2 Blood cultures from 1/26 showed coagulase-negative staph likely contaminant. -cultures from 1/28 is without any growth. Was on vancomycin for HCAP. Cultures from 2/6 pending  Healthcare associated pneumonia -Course with vancomycin and cefepime. Developed new fever as discussed above. Now on Levaquin.  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 Continue Aranesp  Diabetes mellitus blood glucose is stable, continue sliding scale insulin with NovoLog.. Episodes of hypoglycemia noted. Monitor for now.  Chronic left lower extremity pain Patient was seen by vascular surgery. She underwent ligation of further graft. She had initially  refused any procedure but after discussing with family and the medicine. She went ahead with the procedure. She continues to have pain every now and then.  COPD Stable. Continue duo nebs when necessary  ESRD on hemodialysis Followed by nephrology, on  hemodialysis MWF  Chronic immunosuppression in the setting of liver transplantation/Hepatitis C Continue Prograf  Hypothyroidism TSH 4.836, continue Synthroid.  Chronic anticoagulation She is on Coumadin for mechanical aortic valve. Pharmacy is managing.  DVT Prophylaxis: On warfarin    Code Status: DO NOT RESUSCITATE  Family Communication: Discussed with patient  Disposition Plan: Anticipate discharge in 24-48 hours.    LOS: 13 days   [facility] Pager [phone] 03/19/2016, 4:01 PM  If 7PM-7AM, please contact night-coverage at [email or web address], password [facility]

## 2016-03-19 NOTE — Progress Notes (Signed)
PT Cancellation Note  Patient Details Name: [name] MRN: [id number] DOB: 04-Dec-1955   Cancelled Treatment:    Reason Eval/Treat Not Completed: Patient at procedure or test/unavailable.  Pt in HD this am, will attempt to see pt later as able. 03/19/2016  [name], [place] [phone]  (pager)   Tessie Fass Markee Remlinger 03/19/2016, 9:48 AM

## 2016-03-19 NOTE — Progress Notes (Addendum)
ANTICOAGULATION CONSULT NOTE - Follow Up Consult  Pharmacy Consult for Warfarin; levofloxacin Indication: mechanical AVR, atrial fibrillation; PNA  Allergies  Allergen Reactions  . Acetaminophen Other (See Comments)    Liver transplant recipient   . Codeine Itching  . Mirtazapine Other (See Comments)    hallucination  . Morphine Itching and Other (See Comments)    hallucinate    Patient Measurements: Height: 5\' 4"  (162.6 cm) Weight: 132 lb 4.4 oz (60 kg) IBW/kg (Calculated) : 54.7 Heparin Dosing Weight: 59 kg  Vital Signs: Temp: 98 F (36.7 C) (02/07 1219) Temp Source: Oral (02/07 1219) BP: 126/66 (02/07 1219) Pulse Rate: 77 (02/07 1219)  Labs:  Recent Labs  03/17/16 0523 03/17/16 0738 03/18/16 0840 03/19/16 0727  HGB 8.4*  --   --  7.5*  HCT 25.6*  --   --  23.4*  PLT 278  --   --  294  LABPROT 32.6*  --  34.6*  --   INR 3.10  --  3.33  --   HEPARINUNFRC 0.56  --   --   --   CREATININE  --  7.02*  --  6.14*    Estimated Creatinine Clearance: 8.3 mL/min (by C-G formula based on SCr of 6.14 mg/dL (H)).   Assessment: 61 yo female on heparin>warfarin bridge for atrial flutter and h/o mechanical AVR. Pt was on warfarin PTA which was held for procedure while inpatient initially. Procedure completed 1/30 and warfarin resumed. PTA dose warfarin 2 mg daily.   INR yesterday remained elevated at 3.33. INR not yet drawn this morning,however given elevated level yesterday and addition of levofloxacin which can potentiate INR, would expect it to be higher. Hgb 7.5, plts 294- no overt bleeding noted.    Levofloxacin started yesterday. WBC 22.6, cultures pending.  Vancomycin 1/26>>2/1 Cefepime 1/26>>2/1 LVQ 2/6>>  1/25 BCx: neg 1/26 BCx: 1/2 CONS 1/27 Resp panel negative 1/28 BCX: neg 1/30 MRSA PCR neg 2/6 BCx: sent  Goal of Therapy:  INR goal 2.5 - 3 per outpatient anticoagulation notes Heparin level 0.3-0.7 units/ml Monitor platelets by anticoagulation  protocol: Yes   Plan:  -hold warfarin tonight as anticipate INR to be >3 -Daily INR and CBC while in the hospital -Monitor s/sx bleeding -Levofloxacin 500mg  PO q48h- next dose due 2/8 -Monitor length of therapy of levofloxacin  Hope Holst D. Kearie Mennen, PharmD, BCPS Clinical Pharmacist Pager: [phone] 03/19/2016 12:23 PM

## 2016-03-19 NOTE — Discharge Instructions (Signed)

## 2016-03-20 DIAGNOSIS — M79605 Pain in left leg: Secondary | ICD-10-CM

## 2016-03-20 LAB — PROTIME-INR
INR: 2.6
PROTHROMBIN TIME: 28.3 s — AB (ref 11.4–15.2)

## 2016-03-20 LAB — CBC
HCT: 24.6 % — ABNORMAL LOW (ref 36.0–46.0)
Hemoglobin: 8.1 g/dL — ABNORMAL LOW (ref 12.0–15.0)
MCH: 32.5 pg (ref 26.0–34.0)
MCHC: 32.9 g/dL (ref 30.0–36.0)
MCV: 98.8 fL (ref 78.0–100.0)
PLATELETS: 287 10*3/uL (ref 150–400)
RBC: 2.49 MIL/uL — ABNORMAL LOW (ref 3.87–5.11)
RDW: 17.6 % — ABNORMAL HIGH (ref 11.5–15.5)
WBC: 15.6 10*3/uL — ABNORMAL HIGH (ref 4.0–10.5)

## 2016-03-20 LAB — GLUCOSE, CAPILLARY
GLUCOSE-CAPILLARY: 65 mg/dL (ref 65–99)
GLUCOSE-CAPILLARY: 110 mg/dL — AB (ref 65–99)
Glucose-Capillary: 139 mg/dL — ABNORMAL HIGH (ref 65–99)

## 2016-03-20 MED ORDER — WARFARIN SODIUM 2 MG PO TABS
2.0000 mg | ORAL_TABLET | Freq: Once | ORAL | Status: AC
  Administered 2016-03-20: 2 mg via ORAL
  Filled 2016-03-20: qty 1

## 2016-03-20 MED ORDER — LEVOFLOXACIN 500 MG PO TABS: 500.0000 mg | ORAL_TABLET | ORAL | 0 refills | Status: DC

## 2016-03-20 MED ORDER — OXYCODONE HCL ER 10 MG PO T12A: 10.0000 mg | EXTENDED_RELEASE_TABLET | Freq: Two times a day (BID) | ORAL | 0 refills | Status: DC

## 2016-03-20 MED ORDER — LIDOCAINE 5 % EX PTCH: 3.0000 | MEDICATED_PATCH | CUTANEOUS | Status: DC

## 2016-03-20 MED ORDER — OXYCODONE HCL ER 10 MG PO T12A: 10.0000 mg | EXTENDED_RELEASE_TABLET | Freq: Two times a day (BID) | ORAL | Status: DC

## 2016-03-20 MED ORDER — LIDOCAINE 5 % EX PTCH: 3.0000 | MEDICATED_PATCH | CUTANEOUS | 0 refills | Status: DC

## 2016-03-20 MED ORDER — OXYCODONE HCL 5 MG PO TABS
5.0000 mg | ORAL_TABLET | ORAL | Status: DC | PRN
  Administered 2016-03-20: 10 mg via ORAL
  Administered 2016-03-20: 5 mg via ORAL
  Filled 2016-03-20: qty 2
  Filled 2016-03-20: qty 1

## 2016-03-20 MED ORDER — OXYCODONE HCL 5 MG PO TABS: 5.0000 mg | ORAL_TABLET | Freq: Four times a day (QID) | ORAL | 0 refills | Status: DC | PRN

## 2016-03-20 NOTE — Progress Notes (Signed)
No charge note:  Spoke with daughter, [name] by phone. [name] very concerned about patient's pain control and her ability to care for her Mom at home. F/U meeting planned for 2:30pm today.  [name], AGNP-C Palliative Medicine  Please call Palliative Medicine team phone with any questions [phone]. For individual providers please see AMION.

## 2016-03-20 NOTE — Discharge Summary (Signed)
[facility]  Physician Discharge Summary   Patient ID: [name] MRN: [id number] DOB/AGE: 1955-10-10 61 y.o.  Admit date: 03/05/2016 Discharge date: 03/20/2016  DISCHARGE DIAGNOSES:  Active Problems:   Hypertensive heart disease   GERD   End stage renal disease on dialysis-TTS   CAD (coronary artery disease), native coronary artery   History of prosthetic aortic valve replacement   COPD (chronic obstructive pulmonary disease) (HCC)   Chronic anticoagulation w/ coumadin, goal INR 2.5-3.0 w/ AVR   S/P liver transplant ([place])   Chronic hepatitis C without hepatic coma (HCC)   Pressure injury of skin, stage 2   Elevated troponin   Claudication of left lower extremity (HCC)   Sepsis (HCC)   Hypotension   Goals of care, counseling/discussion   Palliative care by specialist   Fever   Advance care planning   RECOMMENDATIONS FOR OUTPATIENT FOLLOW UP: 1. Patient to go for her dialysis starting tomorrow, as per her usual schedule 2. Continue INR checks at Dialysis.   DISCHARGE CONDITION: fair  Diet recommendation: As before  Filed Weights   03/19/16 0700 03/19/16 1107 03/19/16 2025  Weight: 57.7 kg (127 lb 3.3 oz) 56.4 kg (124 lb 5.4 oz) 60.1 kg (132 lb 8 oz)    INITIAL HISTORY: 61 year old with PMH of ESRD on HD MWF, CAD with LIMA, AVR, liver transplant secondary to hep C, COPD, DM, and HTN. Presented to ED on 1/24 from HD with A.Flutter HR of 180. Patient reported left foot pain in which she is being followed by vascular. Upon arrival to ED patient reports chest pain and had pos troponins as well as a small left pleural effusion on chest xray. Cardiology was consulted and believed that elevated trops were due to ischemic demand in setting of a possible infection. During stay patient has been febrile, lactic acid 1.5, and pending blood cultures. Patient was admitted to step-down. On 1/26 patient became increasingly lethargic and hypotensive. PCCM was consulted and  patient transferred to ICU. Patient did not wish to be intubated on BiPAP. Vascular surgery removed theleft thigh graft and placednewdialysis. Initially,patient and family refused the procedure.Was about to be discharged 2/6, developed SVT and low-grade fever.   Consultants: Nephrology. Vascular surgery. Palliative medicine  Procedures:  1. Ultrasound-guided placement of right IJ tunneled dialysis catheter (23 cm) 2. Ligation of left thigh AV graft   HOSPITAL COURSE:   Sepsis with unknown source -This was present on admission, patient did have fever of 103, WBCs of 15.2 and presence of pneumonia and admission. -Lactic acid was elevated at 2.5. -There is is likely secondary to healthcare associated pneumonia. Blood cultures negative (contaminant) -Sepsis physiology initially resolved. Patient developed fever again on 2/5. She was placed back on antibiotics. WBC is improved. Repeat blood cultures are negative. She feels much better. She'll be discharged on a few more days of Levaquin.  Chest pain/CAD Patient has significant history of CAD. Troponin was elevated. Cardiology was consulted, initially nuclear Myoview was planned but canceled by cardiology. Chest pain resolved, no additional workup is recommended by cardiology.  Coagulase negative staph bacteremia -1/2 Blood cultures from 1/26 showed coagulase-negative staph likely contaminant. -cultures from 1/28 and 2/6 is without any growth. Was on vancomycin for HCAP.   Healthcare associated pneumonia -Patient was initially given vancomycin and cefepime. She completed a course. But then developed low-grade fever and a bump in her WBC. Placed on Levaquin with improvement. Perhaps she needed a longer course. Other source for the WBC  elevation and fever has been found.   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 Low blood glucose levels are due to poor oral  intake.  Chronic left lower extremity pain Patient was experiencing ischemic pain to left foot. She had a left thigh AV graft. Patient was seen by vascular surgery. She underwent ligation of graft. She had initially refused any procedure but after discussing with family and the medicine. She went ahead with the procedure. She continues to have pain every now and then. Outpatient follow-up with vascular surgery. No evidence for active ischemia in the foot. Palliative medicine placed the patient on long-acting OxyContin.  COPD Stable.   ESRD on hemodialysis Resume outpatient hemodialysis on a Monday, Wednesday, Friday schedule.  Chronic immunosuppression in the setting of liver transplantation/Hepatitis C Continue Prograf  Hypothyroidism TSH 4.836, continue Synthroid.  Chronic anticoagulation She is on Coumadin for mechanical aortic valve. INR's were being checked at dialysis. This can continue.  Overall, stable. Okay for discharge today.   PERTINENT LABS:  The results of significant diagnostics from this hospitalization (including imaging, microbiology, ancillary and laboratory) are listed below for reference.    Microbiology: Recent Results (from the past 240 hour(s))  Surgical pcr screen     Status: None   Collection Time: 03/10/16 11:11 PM  Result Value Ref Range Status   MRSA, PCR NEGATIVE NEGATIVE Final   Staphylococcus aureus NEGATIVE NEGATIVE Final    Comment:        The Xpert SA Assay (FDA approved for NASAL specimens in patients over 22 years of age), is one component of a comprehensive surveillance program.  Test performance has been validated by [facility] for patients greater than or equal to 27 year old. It is not intended to diagnose infection nor to guide or monitor treatment.   MRSA PCR Screening     Status: None   Collection Time: 03/11/16  1:00 PM  Result Value Ref Range Status   MRSA by PCR NEGATIVE NEGATIVE Final    Comment:        The  GeneXpert MRSA Assay (FDA approved for NASAL specimens only), is one component of a comprehensive MRSA colonization surveillance program. It is not intended to diagnose MRSA infection nor to guide or monitor treatment for MRSA infections.   Culture, blood (Routine X 2) w Reflex to ID Panel     Status: None (Preliminary result)   Collection Time: 03/18/16  4:50 PM  Result Value Ref Range Status   Specimen Description BLOOD RIGHT HAND  Final   Special Requests BOTTLES DRAWN AEROBIC ONLY 5CC  Final   Culture NO GROWTH 2 DAYS  Final   Report Status PENDING  Incomplete  Culture, blood (Routine X 2) w Reflex to ID Panel     Status: None (Preliminary result)   Collection Time: 03/18/16  4:55 PM  Result Value Ref Range Status   Specimen Description BLOOD RIGHT HAND  Final   Special Requests BOTTLES DRAWN AEROBIC ONLY 5CC  Final   Culture NO GROWTH 2 DAYS  Final   Report Status PENDING  Incomplete     Labs: Basic Metabolic Panel:  Recent Labs Lab 03/14/16 0600 03/17/16 0738 03/19/16 0727  NA 132* 130* 133*  K 4.0 4.6 3.3*  CL 96* 97* 95*  CO2 27 23 27   GLUCOSE 60* 108* 90  BUN 27* 38* 31*  CREATININE 5.16* 7.02* 6.14*  CALCIUM 8.8* 8.5* 8.3*  PHOS 4.2 4.2 4.5  Liver Function Tests:  Recent Labs Lab 03/14/16 0600 03/17/16 0738 03/19/16 0727  ALBUMIN 1.7* 1.7* 1.6*   CBC:  Recent Labs Lab 03/15/16 0436 03/16/16 0450 03/17/16 0523 03/19/16 0727 03/20/16 0626  WBC 19.3* 17.5* 28.7* 22.6* 15.6*  NEUTROABS  --   --  25.3*  --   --   HGB 9.0* 8.0* 8.4* 7.5* 8.1*  HCT 27.2* 25.1* 25.6* 23.4* 24.6*  MCV 97.8 97.3 98.1 97.9 98.8  PLT 273 260 278 294 287   CBG:  Recent Labs Lab 03/19/16 1614 03/19/16 2023 03/20/16 0752 03/20/16 0843 03/20/16 1209  GLUCAP 166* 137* 65 110* 139*     IMAGING STUDIES Dg Chest 2 View  Result Date: 03/18/2016 CLINICAL DATA:  Fever for several days, history of asthma, history of liver transplant, diabetes EXAM: CHEST  2 VIEW  COMPARISON:  Chest x-ray of 03/12/2016 FINDINGS: Aeration of the lungs has improved. Small pleural effusions remain and only mild pulmonary vascular congestion is noted. Right central venous line tip overlies the lower SVC near the expected right atrial junction and aortic valve replacement is noted. Cardiomegaly is stable. No bony abnormality is seen. IMPRESSION: Improved aeration with improvement in CHF pattern. Small pleural effusions remain. Electronically Signed   By: Ivar Drape M.D.   On: 03/18/2016 15:22   Dg Chest 2 View  Result Date: 03/05/2016 CLINICAL DATA:  Diabetes, coronary artery disease, liver transplant patient, history of asthma. No reported chest complaints. The patient is complaining of foot pain. EXAM: CHEST  2 VIEW COMPARISON:  PA and lateral chest x-ray of November 26, 2015 FINDINGS: The lungs are adequately inflated. There is a trace of pleural fluid at the left lung base. The interstitial markings are mildly prominent though stable. The heart is top-normal in size. There is a prosthetic aortic valve in place. The sternal wires are intact. The pulmonary vascularity is not engorged. There is calcification in the wall of the aortic arch. The bony thorax exhibits no acute abnormality. IMPRESSION: Small left pleural effusion. Mild interstitial prominence which is not entirely new. No alveolar pneumonia nor CHF. Thoracic aortic atherosclerosis. Electronically Signed   By: David  Martinique M.D.   On: 03/05/2016 08:50   Dg Chest Port 1 View  Result Date: 03/12/2016 CLINICAL DATA:  Check dialysis catheter positioning EXAM: PORTABLE CHEST 1 VIEW COMPARISON:  03/09/2016 FINDINGS: Cardiac shadow is enlarged. Postsurgical changes are again seen. A new right jugular dialysis catheter is noted in satisfactory position. Vascular congestion and bilateral effusions are again seen likely related to volume overload. No focal confluent infiltrate is seen. IMPRESSION: Dialysis catheter in satisfactory  position. Changes of CHF and pleural effusions likely related to volume overload Electronically Signed   By: Inez Catalina M.D.   On: 03/12/2016 08:13   Dg Chest Port 1 View  Result Date: 03/09/2016 CLINICAL DATA:  Acute respiratory failure EXAM: PORTABLE CHEST 1 VIEW COMPARISON:  03/07/2016 FINDINGS: Prior median sternotomy and valve replacement. Heart is borderline in size. Diffuse bilateral airspace disease and small bilateral effusions. Airspace disease slightly improved since prior study. IMPRESSION: Diffuse bilateral airspace disease, slightly improved, likely improving edema. Small bilateral effusions. Electronically Signed   By: Rolm Baptise M.D.   On: 03/09/2016 07:25   Dg Chest Port 1 View  Result Date: 03/07/2016 CLINICAL DATA:  Fever, hypoxemia. EXAM: PORTABLE CHEST 1 VIEW COMPARISON:  Radiographs of March 05, 2016. FINDINGS: Stable cardiomediastinal silhouette. Status post aortic valve repair. Atherosclerosis of thoracic aorta is noted. No pneumothorax is  noted. Interval development of diffuse interstitial densities throughout both lungs concerning for edema or possibly pneumonia. Mild left pleural effusion is noted. Bony thorax is unremarkable. IMPRESSION: Aortic atherosclerosis. Diffuse interstitial densities are noted throughout both lungs concerning for edema or possibly pneumonia. Mild left pleural effusion. Electronically Signed   By: Marijo Conception, M.D.   On: 03/07/2016 09:29   Dg Foot 2 Views Left  Result Date: 03/07/2016 CLINICAL DATA:  Left foot tenderness. EXAM: LEFT FOOT - 2 VIEW COMPARISON:  None. FINDINGS: Extensive vascular calcifications throughout the foot. Plantar and posterior calcaneal spurs. No radiographic changes of osteomyelitis. No fracture, subluxation or dislocation. IMPRESSION: No acute bony abnormality.  Extensive vascular calcifications. Electronically Signed   By: Rolm Baptise M.D.   On: 03/07/2016 09:27   Dg Fluoro Guide Cv Line-no Report  Result Date:  03/11/2016 Fluoroscopy was utilized by the requesting physician.  No radiographic interpretation.    DISCHARGE EXAMINATION: Vitals:   03/19/16 1219 03/19/16 2025 03/20/16 0543 03/20/16 0952  BP: 126/66 (!) 145/42 (!) 133/47 (!) 131/55  Pulse: 77 63 79 85  Resp: 16 19 20 18   Temp: 98 F (36.7 C) 98.5 F (36.9 C) 98.7 F (37.1 C) 98.2 F (36.8 C)  TempSrc: Oral Oral Oral Oral  SpO2: 98% 94% 100% 100%  Weight:  60.1 kg (132 lb 8 oz)    Height:       General appearance: alert, cooperative, appears stated age and no distress Resp: clear to auscultation bilaterally Cardio: regular rate and rhythm, S1, S2 normal, no murmur, click, rub or gallop GI: soft, non-tender; bowel sounds normal; no masses,  no organomegaly Extremities: extremities normal, atraumatic, no cyanosis or edema  DISPOSITION: Home with daughter  Discharge Instructions    Call MD for:  difficulty breathing, headache or visual disturbances    Complete by:  As directed    Call MD for:  extreme fatigue    Complete by:  As directed    Call MD for:  persistant dizziness or light-headedness    Complete by:  As directed    Call MD for:  persistant nausea and vomiting    Complete by:  As directed    Call MD for:  severe uncontrolled pain    Complete by:  As directed    Call MD for:  temperature >100.4    Complete by:  As directed    Diet - low sodium heart healthy    Complete by:  As directed    Discharge instructions    Complete by:  As directed    Please take your medications as prescribed. Keep your usual dialysis schedule.  You were cared for by a hospitalist during your hospital stay. If you have any questions about your discharge medications or the care you received while you were in the hospital after you are discharged, you can call the unit and asked to speak with the hospitalist on call if the hospitalist that took care of you is not available. Once you are discharged, your primary care physician will handle  any further medical issues. Please note that NO REFILLS for any discharge medications will be authorized once you are discharged, as it is imperative that you return to your primary care physician (or establish a relationship with a primary care physician if you do not have one) for your aftercare needs so that they can reassess your need for medications and monitor your lab values. If you do not have a primary care physician,  you can call [phone] for a physician referral.   Increase activity slowly    Complete by:  As directed    Increase activity slowly    Complete by:  As directed       ALLERGIES:  Allergies  Allergen Reactions  . Acetaminophen Other (See Comments)    Liver transplant recipient   . Codeine Itching  . Mirtazapine Other (See Comments)    hallucination  . Morphine Itching and Other (See Comments)    hallucinate     Current Discharge Medication List    START taking these medications   Details  levofloxacin (LEVAQUIN) 500 MG tablet Take 1 tablet (500 mg total) by mouth every other day. Qty: 4 tablet, Refills: 0    lidocaine (LIDODERM) 5 % Place 3 patches onto the skin daily. Remove & Discard patch within 12 hours or as directed by MD Qty: 30 patch, Refills: 0    oxyCODONE (OXYCONTIN) 10 mg 12 hr tablet Take 1 tablet (10 mg total) by mouth every 12 (twelve) hours. Qty: 28 tablet, Refills: 0      CONTINUE these medications which have CHANGED   Details  oxyCODONE (OXY IR/ROXICODONE) 5 MG immediate release tablet Take 1-2 tablets (5-10 mg total) by mouth every 6 (six) hours as needed. Qty: 10 tablet, Refills: 0      CONTINUE these medications which have NOT CHANGED   Details  aspirin EC 81 MG tablet Take 81 mg by mouth daily.    calcium acetate (PHOSLO) 667 MG capsule Take 1 capsule (667 mg total) by mouth 3 (three) times daily with meals. Qty: 30 capsule, Refills: 1    gabapentin (NEURONTIN) 300 MG capsule Take 300 mg by mouth every evening.    Insulin  Detemir (LEVEMIR FLEXPEN) 100 UNIT/ML Pen Inject 2-6 Units into the skin See admin instructions. Only if cbg is over 200    levothyroxine (SYNTHROID, LEVOTHROID) 175 MCG tablet Take 1 tablet (175 mcg total) by mouth daily before breakfast. Qty: 30 tablet, Refills: 6    metoprolol tartrate (LOPRESSOR) 25 MG tablet Take 25 mg by mouth 2 (two) times daily.    ondansetron (ZOFRAN ODT) 4 MG disintegrating tablet Take 1 tablet (4 mg total) by mouth every 8 (eight) hours as needed for nausea. Qty: 6 tablet, Refills: 0    senna-docusate (SENOKOT-S) 8.6-50 MG tablet Take 1 tablet by mouth daily.    tacrolimus (PROGRAF) 1 MG capsule Take 2 capsules (2 mg total) by mouth 2 (two) times daily. Take 2 capsules twice daily. Qty: 120 capsule, Refills: 0    warfarin (COUMADIN) 2 MG tablet Take 1- 2 tablets by mouth daily or as directed by coumadin clinic Qty: 45 tablet, Refills: 3    nitroGLYCERIN (NITROSTAT) 0.4 MG SL tablet Place 1 tablet (0.4 mg total) under the tongue every 5 (five) minutes as needed for chest pain. Qty: 30 tablet, Refills: 0         Follow-up Information    [facility] Follow up.   Specialty:  Home Health Services Why:  home health PT arranged , office will call and set up home visits Contact information: [place] [address] [phone]        Ruta Hinds, MD. Schedule an appointment as soon as possible for a visit in 3 weeks.   Specialties:  Vascular Surgery, Cardiology Why:  APPOINTMENT: ATTEMPTED TO MAKE BUT NO ANSWER AT DR. OFFICE Contact information: [address]  [place] [address] [phone]           TOTAL DISCHARGE TIME: 35 mins  [facility] Pager [phone]  03/20/2016, 4:07 PM

## 2016-03-20 NOTE — Progress Notes (Signed)
ANTICOAGULATION CONSULT NOTE - Follow Up Consult  Pharmacy Consult for Warfarin Indication: mechanical AVR, atrial fibrillation  Allergies  Allergen Reactions  . Acetaminophen Other (See Comments)    Liver transplant recipient   . Codeine Itching  . Mirtazapine Other (See Comments)    hallucination  . Morphine Itching and Other (See Comments)    hallucinate    Patient Measurements: Height: 5\' 4"  (162.6 cm) Weight: 132 lb 8 oz (60.1 kg) IBW/kg (Calculated) : 54.7 Heparin Dosing Weight: 59 kg  Vital Signs: Temp: 98.2 F (36.8 C) (02/08 0952) Temp Source: Oral (02/08 0952) BP: 131/55 (02/08 0952) Pulse Rate: 85 (02/08 0952)  Labs:  Recent Labs  03/18/16 0840 03/19/16 0727 03/19/16 1220 03/20/16 0626  HGB  --  7.5*  --  8.1*  HCT  --  23.4*  --  24.6*  PLT  --  294  --  287  LABPROT 34.6*  --  32.2* 28.3*  INR 3.33  --  3.05 2.60  CREATININE  --  6.14*  --   --     Estimated Creatinine Clearance: 8.3 mL/min (by C-G formula based on SCr of 6.14 mg/dL (H)).   Assessment: 61 yo female on heparin>warfarin bridge for atrial flutter and h/o mechanical AVR. Pt was on warfarin PTA which was held for procedure while inpatient initially. Procedure completed 1/30 and warfarin resumed. PTA dose warfarin 2 mg daily.   Warfarin not given yesterday due to elevated INR and start of levofloxacin on 2/6.  INR today in range at 2.6. Hgb 8.1, plts 287- no overt bleeding noted.   Goal of Therapy:  INR goal 2.5 - 3 per outpatient anticoagulation notes Heparin level 0.3-0.7 units/ml Monitor platelets by anticoagulation protocol: Yes   Plan:  -warfarin 2mg  po x1 tonight  -Daily INR and CBC while in the hospital -Monitor s/sx bleeding -Follow length of treatment course for levofloxacin  Balen Woolum D. Dyer Klug, PharmD, BCPS Clinical Pharmacist Pager: [phone] 03/20/2016 10:09 AM

## 2016-03-20 NOTE — Progress Notes (Signed)
Patient was discharged to home. AVS reviewed and prescriptions provided (from both hospitalist and palliative). Iv and tele were discontinued. Patient left floor with v/t stable no complaints via wheelchair with volunteer.

## 2016-03-20 NOTE — Progress Notes (Signed)
Daily Progress Note   Patient Name: [name]       Date: 03/20/2016 DOB: June 01, 1955  Age: 61 y.o. MRN#: [id number] Attending Physician: Bonnielee Haff, MD Primary Care Physician: No PCP Per Patient Admit Date: 03/05/2016  Reason for Consultation/Follow-up: Disposition and Pain control  Subjective: Met with patient and daughter. She is being discharged today. They are concerned about her being discharged and managing pain at home. There is difficulty in finding continuity of care once she is discharged from the hospital due to the fact that primary care provider is unwilling to prescribe pain medication. Patient was previously seeing Dr. Helene Kelp as PCP and is now seeing Dr. Artelia Laroche who makes home visits. She has an appointment at [facility] pain management clinic at the end of the month. Reports pain in her L foot. Has chronic ischemic wound, pain is intense, differs in characteristics with sharp, burning and aching pain. Relieved intermittently with oxycodone IR but quickly returns before next dose of medication is due.   Review of Systems  Gastrointestinal: Positive for constipation.  Musculoskeletal: Positive for myalgias.  All other systems reviewed and are negative.   Length of Stay: 14  Current Medications: Scheduled Meds:  . calcitRIOL  0.25 mcg Oral Q M,W,F-1800  . calcium acetate  667 mg Oral TID WC  . darbepoetin (ARANESP) injection - DIALYSIS  150 mcg Intravenous Q Wed-HD  . feeding supplement (NEPRO CARB STEADY)  237 mL Oral BID BM  . feeding supplement (PRO-STAT SUGAR FREE 64)  30 mL Oral BID  . gabapentin  300 mg Oral QPM  . insulin aspart  0-9 Units Subcutaneous TID WC  . insulin detemir  5 Units Subcutaneous QHS  . levofloxacin  500 mg Oral Q48H  . levothyroxine   175 mcg Oral QAC breakfast  . lidocaine  3 patch Transdermal Q24H  . metoprolol tartrate  25 mg Oral BID  . multivitamin  1 tablet Oral QHS  . oxyCODONE  10 mg Oral Q12H  . senna-docusate  1 tablet Oral Daily  . sodium chloride flush  3 mL Intravenous Q12H  . tacrolimus  2 mg Oral BID  . warfarin  2 mg Oral ONCE-1800  . Warfarin - Pharmacist Dosing Inpatient   Does not apply q1800    Continuous Infusions: . sodium chloride  10 mL/hr at 03/11/16 1324    PRN Meds: acetaminophen, acetaminophen, metoprolol, morphine injection, ondansetron (ZOFRAN) IV, ondansetron, oxyCODONE  Physical Exam  Constitutional: She is oriented to person, place, and time.  Neurological: She is alert and oriented to person, place, and time.  Skin: Skin is warm and dry.  Nursing note and vitals reviewed.           Vital Signs: BP (!) 131/55 (BP Location: Right Arm)   Pulse 85   Temp 98.2 F (36.8 C) (Oral)   Resp 18   Ht 5' 4"  (1.626 m)   Wt 60.1 kg (132 lb 8 oz)   SpO2 100%   BMI 22.74 kg/m  SpO2: SpO2: 100 % O2 Device: O2 Device: Not Delivered O2 Flow Rate: O2 Flow Rate (L/min): 2 L/min  Intake/output summary:  Intake/Output Summary (Last 24 hours) at 03/20/16 1508 Last data filed at 03/20/16 1100  Gross per 24 hour  Intake              360 ml  Output                0 ml  Net              360 ml   LBM: Last BM Date: 03/18/16 Baseline Weight: Weight: 59 kg (130 lb) Most recent weight: Weight: 60.1 kg (132 lb 8 oz)       Palliative Assessment/Data: PPS: 60%    Flowsheet Rows   Flowsheet Row Most Recent Value  Intake Tab  Referral Department  Critical care  Unit at Time of Referral  ICU  Palliative Care Primary Diagnosis  Sepsis/Infectious Disease  Date Notified  03/08/16  Palliative Care Type  New Palliative care  Reason for referral  Clarify Goals of Care  Date of Admission  03/05/16  Date first seen by Palliative Care  03/09/16  # of days Palliative referral response time  1  Day(s)  # of days IP prior to Palliative referral  3  Clinical Assessment  Palliative Performance Scale Score  30%  Psychosocial & Spiritual Assessment  Palliative Care Outcomes  Patient/Family meeting held?  Yes  Who was at the meeting?  patient.  Called Dtr Nira Conn on phone  Palliative Care Outcomes  Clarified goals of care  Patient/Family wishes: Interventions discontinued/not started   Other (Comment)      Patient Active Problem List   Diagnosis Date Noted  . Fever   . Advance care planning   . Goals of care, counseling/discussion   . Palliative care by specialist   . Sepsis ([place])   . Hypotension   . Claudication of left lower extremity ([place])   . Elevated troponin 03/05/2016  . Pressure injury of skin, stage 2 11/27/2015  . H/O unilateral nephrectomy 07/06/2015  . Paroxysmal SVT (supraventricular tachycardia) ([place]) 06/22/2014  . Complications due to renal dialysis device, implant, and graft 05/11/2014  . Acute blood loss anemia 03/31/2014  . Renal dialysis device, implant, or graft complication 81/85/6314  . Acute hyperkalemia   . S/P liver transplant ([place])   . Chronic hepatitis C without hepatic coma ([place])   . Pulmonary edema 01/03/2014  . Respiratory failure ([place]) 01/03/2014  . Long term current use of anticoagulant therapy 04/22/2013  . Chronic anticoagulation w/ coumadin, goal INR 2.5-3.0 w/ AVR 04/14/2013  . Tobacco abuse 02/28/2013  . COPD (chronic obstructive pulmonary disease) ([place]) 02/21/2013  . Chronic combined systolic and diastolic congestive heart failure ([place]) 02/06/2013  . Acute  respiratory failure ([place]) 11/02/2012  . Acute pulmonary edema ([place]) 11/02/2012  . Acute on chronic systolic CHF (congestive heart failure) ([place]) 11/02/2012  . Dyslipidemia-LDL 104, not on statin with Hx of liver transplant 07/30/2012  . CAD (coronary artery disease), native coronary artery 07/27/2012  . History of prosthetic aortic valve replacement   . End stage renal disease  on dialysis-TTS 02/27/2011  . Hypothyroidism 06/25/2006  . Type 2 diabetes mellitus with chronic kidney disease ([place]) 06/25/2006  . GERD 06/25/2006  . Anemia due to chronic renal failure    . Hypertensive heart disease     Palliative Care Assessment & Plan   Patient Profile: 61 y.o.femalewith past medical history of liver transplant, DM2 w/ neuropathy, PVD, CAD s/p stent, ESRD on HD, AS, COPD, chronic back painwho was admitted on 1/24/2018with aflutter and sepsis. She was found to have staph bacteremia. Her troponin was elevated likely due to demand ischemia. She was experiencing LLE pain. Vascular was consulted. They do not feel the foot is the source of her infection, but they did offer to ligate the left thigh graft and place a new left thigh graft and catheter on Monday. Patient initially refused surgery. However, since cardiology has cleared her, she desires surgery in hopes of decreasing pain. She has recovered well from surgery but is still having significant pain in her L foot that is inhibiting her ability to function.   Assessment/Recommendations/Plan   Oxycontin 27m po bid- 14 day prescription written. She is to contact primary care MD for further prescription until she can see pain management clinic.  Lidoderm patches to L foot and wound area- leave on for 12 hours, remove for 12 hours repeat. Prescription given to patient.   Continue senna and miralax for bowel prophylaxis   Code Status:  DNR  Prognosis:   Unable to determine  Discharge Planning:  [place] and daughter, QRaynelle Fanning   Thank you for allowing the Palliative Medicine Team to assist in the care of this patient.  Total time: 90 minutes  Greater than 50%  of this time was spent counseling and coordinating care related to the above assessment and plan.  KMariana Kaufman AGNP-C Palliative Medicine   Please contact Palliative Medicine Team phone  at [phone] questions and concerns.

## 2016-03-20 NOTE — Progress Notes (Signed)
Occupational Therapy Treatment Patient Details Name: [name] MRN: [id number] DOB: Jun 13, 1955 Today's Date: 03/20/2016    History of present illness  [name] is a 61 y.o. female with medical history significant end-stage renal disease on Tuesday Thursday Saturday dialysis, CAD with LIMA, AVR, liver transplant, hep C, COPD, diabetes, hypertension, SVT presents to the emergency Department chief complaint persistent/worsening left foot/leg pain and palpitations with chest pain. Initial evaluation reveals elevated troponin, leukocytosis and a flutter HR 180   OT comments  Pt making excellent progress with ADL and mobility. Pt safe to DC home with S for mobility when medically stable.Continue to recommend HHOT to maximize functional level of independence with ADL and mobiliy.   Follow Up Recommendations  Home health OT;Supervision/Assistance - 24 hour    Equipment Recommendations  Tub/shower bench    Recommendations for Other Services      Precautions / Restrictions Precautions Precautions: Fall Restrictions Weight Bearing Restrictions: No       Mobility Bed Mobility Overal bed mobility: Modified Independent                Transfers Overall transfer level: Needs assistance   Transfers: Sit to/from Stand Sit to Stand: Supervision Stand pivot transfers: Supervision       General transfer comment: vc for safety/hand placement    Balance Overall balance assessment: Needs assistance   Sitting balance-Leahy Scale: Good       Standing balance-Leahy Scale: Fair Standing balance comment: ableto stand at sink wthout BUE support. Body supported on counter                   ADL                       Lower Body Dressing: Min guard;Sit to/from stand   Toilet Transfer: Supervision/safety;RW           Functional mobility during ADLs: Supervision/safety;Rolling walker;Cueing for safety General ADL Comments: Educated pt on rerducing risk of  falls. Handout given. Recommend pt use tub bench for safe tub transfers. HHOT to further assess bathroom situation. Pt to sponge bath until DME attained as pt is fall risk. Pt verbalized understanding.       Vision                     Perception     Praxis      Cognition   Behavior During Therapy: WFL for tasks assessed/performed;Flat affect Overall Cognitive Status: Within Functional Limits for tasks assessed                       Extremity/Trunk Assessment     generalized weakness          Exercises     Shoulder Instructions       General Comments      Pertinent Vitals/ Pain       Pain Assessment: Faces Faces Pain Scale: Hurts little more Pain Location: L foot Pain Descriptors / Indicators: Burning;Discomfort;Grimacing Pain Intervention(s): Limited activity within patient's tolerance  Home Living                                          Prior Functioning/Environment              Frequency  Min 2X/week  Progress Toward Goals  OT Goals(current goals can now be found in the care plan section)  Progress towards OT goals: Progressing toward goals  Acute Rehab OT Goals Patient Stated Goal: return home OT Goal Formulation: With patient Time For Goal Achievement: 03/30/16 Potential to Achieve Goals: Good ADL Goals Pt Will Perform Upper Body Bathing: with set-up;sitting Pt Will Perform Lower Body Bathing: with supervision;sit to/from stand Pt Will Perform Lower Body Dressing: with supervision;sit to/from stand Pt Will Transfer to Toilet: with supervision;ambulating;bedside commode Pt Will Perform Toileting - Clothing Manipulation and hygiene: with supervision;sit to/from stand  Plan Discharge plan remains appropriate    Co-evaluation                 End of Session Equipment Utilized During Treatment: Gait belt;Rolling walker   Activity Tolerance Patient tolerated treatment well   Patient Left in  chair;with call bell/phone within reach;with chair alarm set   Nurse Communication Mobility status        Time: [id number] OT Time Calculation (min): 29 min  Charges: OT General Charges $OT Visit: 1 Procedure OT Treatments $Self Care/Home Management : 23-37 mins  Lauren Modisette,HILLARY 03/20/2016, 12:12 PM   [facility], OTR/L  [phone] 03/20/2016

## 2016-03-20 NOTE — Progress Notes (Signed)
[facility] Progress Note   Dialysis Orders: [name] MWF 3h 21min 59.5kg Hep none L fem AVG Po calcitriol 0.45mcg /HD MIrcera 60 mcg q 2wks (was to start 03/05/16) Other op labs hgb 9.8 Ca 9.3 phos 8.7 PTH 1294  Assessment/Plan: 1. PAD/ L foot ischemia: s/pL thigh AVG ligationto hopefully improve wound healing on the left heel.- given fevers and persistently ^ WBC and tenderness, WBC better today 2. Fever/possible PNA: tmax 99,6 had worsening ^ WBC to 28K down some this am to 22.6 had  BCx 1/26 1 of 2 CoNS, likely contaminant. BCx 1/28 negative. S/p 7d course of Vanc/Cefepime - BC redrawn 2/6- CXR improved 2/6 3. ESRD: HD MWF,s/pL thigh AVG ligation 1/30w/ right IJ cath insertion by [facility]. "If her overall condition improves will place right thigh graft a later interval" per [facility] consult note. K running low here - requiring added K bath - on 2 K as an outpt - need to repeat K after d/c to insure adequated intake 4. Aflutter/RVR/AVR: on heparin/warfarin, pharmacy dosing.INR 2.6 2/8 5. Chest Pain /CAD: Cardiology consulted, felt to be "demand ischemia." 6. BP/Volume : CXR 1/31 with volume excess. Net UF 3 L on Monday with post wt 60.8- still above- expect she has lost wt given poor intake - BP variable EDW - keep at usual EDW at d/c though may need to be lowered; plan titrate volume down more if here Wed' Pre HD Wt 60 bed scale - goal 1.5 today 7. Anemia: Hgb 8.1 2/8with low tsat. On Aranesp weekly 100/1/31 increased to 150 2/7 -drops in Hgb may be due to ESA deficit and delay of starting ESA - had been off ESA PTA;  IV iron.-to finish Fe after d/c  8. 2HPTH: Continuevit D/ phoslo.-Ca/P ok 9. Hx Liver transplant (d/t Hep C): Per primary. 10. Type 2 DM: per primary.  11. COPD 12. Severe protein malnutrition: Alb 1.6, on supplements.- needs liberalized diet with prn salt to promote intake - 13. Goals of care: seen by Palliative Care; multiple medical issues; DNR/doesn't  want aggressive measures; appreciate their assistance 14. DIspo - DNR/ d/c to home   Myriam Jacobson, PA-C [place] [phone] 03/20/2016,9:08 AM  LOS: 14 days   Pt seen, examined and agree w A/P as above.  [name] MD [name] pager [phone]   03/20/2016, 2:47 PM    Subjective:   Feels good today. No problems with HD yesterday  Objective Vitals:   03/19/16 1107 03/19/16 1219 03/19/16 2025 03/20/16 0543  BP: 139/70 126/66 (!) 145/42 (!) 133/47  Pulse: 80 77 63 79  Resp: 16 16 19 20   Temp: 98.3 F (36.8 C) 98 F (36.7 C) 98.5 F (36.9 C) 98.7 F (37.1 C)  TempSrc: Oral Oral Oral Oral  SpO2: 98% 98% 94% 100%  Weight: 56.4 kg (124 lb 5.4 oz)  60.1 kg (132 lb 8 oz)   Height:       Physical Exam General: NAD - looks much better Heart: RRR + click Lungs: no rales Abdomen: soft  Extremities: no edema of LE - left heel dark Dialysis Access:  Right IJ   Additional Objective Labs: Basic Metabolic Panel:  Recent Labs Lab 03/14/16 0600 03/17/16 0738 03/19/16 0727  NA 132* 130* 133*  K 4.0 4.6 3.3*  CL 96* 97* 95*  CO2 27 23 27   GLUCOSE 60* 108* 90  BUN 27* 38* 31*  CREATININE 5.16* 7.02* 6.14*  CALCIUM 8.8* 8.5* 8.3*  PHOS 4.2 4.2 4.5   Liver Function Tests:  Recent Labs Lab 03/14/16 0600 03/17/16 0738 03/19/16 0727  ALBUMIN 1.7* 1.7* 1.6*   No results for input(s): LIPASE, AMYLASE in the last 168 hours. CBC:  Recent Labs Lab 03/15/16 0436 03/16/16 0450 03/17/16 0523 03/19/16 0727 03/20/16 0626  WBC 19.3* 17.5* 28.7* 22.6* 15.6*  NEUTROABS  --   --  25.3*  --   --   HGB 9.0* 8.0* 8.4* 7.5* 8.1*  HCT 27.2* 25.1* 25.6* 23.4* 24.6*  MCV 97.8 97.3 98.1 97.9 98.8  PLT 273 260 278 294 287   Blood Culture    Component Value Date/Time   SDES BLOOD RIGHT HAND 03/18/2016 1655   SPECREQUEST BOTTLES DRAWN AEROBIC ONLY 5CC 03/18/2016 1655   CULT NO GROWTH < 24 HOURS 03/18/2016 1655   REPTSTATUS  PENDING 03/18/2016 1655    Cardiac Enzymes: No results for input(s): CKTOTAL, CKMB, CKMBINDEX, TROPONINI in the last 168 hours. CBG:  Recent Labs Lab 03/19/16 1233 03/19/16 1614 03/19/16 2023 03/20/16 0752 03/20/16 0843  GLUCAP 69 166* 137* 65 110*   Iron Studies: No results for input(s): IRON, TIBC, TRANSFERRIN, FERRITIN in the last 72 hours. Lab Results  Component Value Date   INR 2.60 03/20/2016   INR 3.05 03/19/2016   INR 3.33 03/18/2016   Studies/Results: Dg Chest 2 View  Result Date: 03/18/2016 CLINICAL DATA:  Fever for several days, history of asthma, history of liver transplant, diabetes EXAM: CHEST  2 VIEW COMPARISON:  Chest x-ray of 03/12/2016 FINDINGS: Aeration of the lungs has improved. Small pleural effusions remain and only mild pulmonary vascular congestion is noted. Right central venous line tip overlies the lower SVC near the expected right atrial junction and aortic valve replacement is noted. Cardiomegaly is stable. No bony abnormality is seen. IMPRESSION: Improved aeration with improvement in CHF pattern. Small pleural effusions remain. Electronically Signed   By: Ivar Drape M.D.   On: 03/18/2016 15:22   Medications: . sodium chloride 10 mL/hr at 03/11/16 1324   . calcitRIOL  0.25 mcg Oral Q M,W,F-1800  . calcium acetate  667 mg Oral TID WC  . darbepoetin (ARANESP) injection - DIALYSIS  150 mcg Intravenous Q Wed-HD  . feeding supplement (NEPRO CARB STEADY)  237 mL Oral BID BM  . feeding supplement (PRO-STAT SUGAR FREE 64)  30 mL Oral BID  . gabapentin  300 mg Oral QPM  . insulin aspart  0-9 Units Subcutaneous TID WC  . insulin detemir  5 Units Subcutaneous QHS  . levofloxacin  500 mg Oral Q48H  . levothyroxine  175 mcg Oral QAC breakfast  . metoprolol tartrate  25 mg Oral BID  . multivitamin  1 tablet Oral QHS  . senna-docusate  1 tablet Oral Daily  . sodium chloride flush  3 mL Intravenous Q12H  . tacrolimus  2 mg Oral BID  . Warfarin - Pharmacist  Dosing Inpatient   Does not apply [phone]

## 2016-03-21 DIAGNOSIS — I359 Nonrheumatic aortic valve disorder, unspecified: Secondary | ICD-10-CM | POA: Diagnosis not present

## 2016-03-21 DIAGNOSIS — N2581 Secondary hyperparathyroidism of renal origin: Secondary | ICD-10-CM | POA: Diagnosis not present

## 2016-03-21 DIAGNOSIS — E118 Type 2 diabetes mellitus with unspecified complications: Secondary | ICD-10-CM | POA: Diagnosis not present

## 2016-03-21 DIAGNOSIS — E039 Hypothyroidism, unspecified: Secondary | ICD-10-CM | POA: Diagnosis not present

## 2016-03-21 DIAGNOSIS — N186 End stage renal disease: Secondary | ICD-10-CM | POA: Diagnosis not present

## 2016-03-21 DIAGNOSIS — D631 Anemia in chronic kidney disease: Secondary | ICD-10-CM | POA: Diagnosis not present

## 2016-03-21 DIAGNOSIS — E876 Hypokalemia: Secondary | ICD-10-CM | POA: Diagnosis not present

## 2016-03-21 DIAGNOSIS — D509 Iron deficiency anemia, unspecified: Secondary | ICD-10-CM | POA: Diagnosis not present

## 2016-03-21 LAB — PROTIME-INR: INR: 2.7 — AB (ref 0.9–1.1)

## 2016-03-22 DIAGNOSIS — E1122 Type 2 diabetes mellitus with diabetic chronic kidney disease: Secondary | ICD-10-CM | POA: Diagnosis not present

## 2016-03-22 DIAGNOSIS — Z7982 Long term (current) use of aspirin: Secondary | ICD-10-CM | POA: Diagnosis not present

## 2016-03-22 DIAGNOSIS — Z452 Encounter for adjustment and management of vascular access device: Secondary | ICD-10-CM | POA: Diagnosis not present

## 2016-03-22 DIAGNOSIS — Z7901 Long term (current) use of anticoagulants: Secondary | ICD-10-CM | POA: Diagnosis not present

## 2016-03-22 DIAGNOSIS — E039 Hypothyroidism, unspecified: Secondary | ICD-10-CM | POA: Diagnosis not present

## 2016-03-22 DIAGNOSIS — I2510 Atherosclerotic heart disease of native coronary artery without angina pectoris: Secondary | ICD-10-CM | POA: Diagnosis not present

## 2016-03-22 DIAGNOSIS — F17210 Nicotine dependence, cigarettes, uncomplicated: Secondary | ICD-10-CM | POA: Diagnosis not present

## 2016-03-22 DIAGNOSIS — J449 Chronic obstructive pulmonary disease, unspecified: Secondary | ICD-10-CM | POA: Diagnosis not present

## 2016-03-22 DIAGNOSIS — Z952 Presence of prosthetic heart valve: Secondary | ICD-10-CM | POA: Diagnosis not present

## 2016-03-22 DIAGNOSIS — D631 Anemia in chronic kidney disease: Secondary | ICD-10-CM | POA: Diagnosis not present

## 2016-03-22 DIAGNOSIS — Z992 Dependence on renal dialysis: Secondary | ICD-10-CM | POA: Diagnosis not present

## 2016-03-22 DIAGNOSIS — L89620 Pressure ulcer of left heel, unstageable: Secondary | ICD-10-CM | POA: Diagnosis not present

## 2016-03-22 DIAGNOSIS — E1142 Type 2 diabetes mellitus with diabetic polyneuropathy: Secondary | ICD-10-CM | POA: Diagnosis not present

## 2016-03-22 DIAGNOSIS — R2689 Other abnormalities of gait and mobility: Secondary | ICD-10-CM | POA: Diagnosis not present

## 2016-03-22 DIAGNOSIS — L89153 Pressure ulcer of sacral region, stage 3: Secondary | ICD-10-CM | POA: Diagnosis not present

## 2016-03-22 DIAGNOSIS — Z944 Liver transplant status: Secondary | ICD-10-CM | POA: Diagnosis not present

## 2016-03-22 DIAGNOSIS — Z794 Long term (current) use of insulin: Secondary | ICD-10-CM | POA: Diagnosis not present

## 2016-03-22 DIAGNOSIS — I132 Hypertensive heart and chronic kidney disease with heart failure and with stage 5 chronic kidney disease, or end stage renal disease: Secondary | ICD-10-CM | POA: Diagnosis not present

## 2016-03-22 DIAGNOSIS — Z9181 History of falling: Secondary | ICD-10-CM | POA: Diagnosis not present

## 2016-03-22 DIAGNOSIS — E1151 Type 2 diabetes mellitus with diabetic peripheral angiopathy without gangrene: Secondary | ICD-10-CM | POA: Diagnosis not present

## 2016-03-22 DIAGNOSIS — N186 End stage renal disease: Secondary | ICD-10-CM | POA: Diagnosis not present

## 2016-03-22 DIAGNOSIS — Z951 Presence of aortocoronary bypass graft: Secondary | ICD-10-CM | POA: Diagnosis not present

## 2016-03-22 DIAGNOSIS — I5042 Chronic combined systolic (congestive) and diastolic (congestive) heart failure: Secondary | ICD-10-CM | POA: Diagnosis not present

## 2016-03-22 DIAGNOSIS — Z86718 Personal history of other venous thrombosis and embolism: Secondary | ICD-10-CM | POA: Diagnosis not present

## 2016-03-23 LAB — CULTURE, BLOOD (ROUTINE X 2)
Culture: NO GROWTH
Culture: NO GROWTH

## 2016-03-24 DIAGNOSIS — N2581 Secondary hyperparathyroidism of renal origin: Secondary | ICD-10-CM | POA: Diagnosis not present

## 2016-03-24 DIAGNOSIS — E118 Type 2 diabetes mellitus with unspecified complications: Secondary | ICD-10-CM | POA: Diagnosis not present

## 2016-03-24 DIAGNOSIS — N186 End stage renal disease: Secondary | ICD-10-CM | POA: Diagnosis not present

## 2016-03-24 DIAGNOSIS — D509 Iron deficiency anemia, unspecified: Secondary | ICD-10-CM | POA: Diagnosis not present

## 2016-03-24 DIAGNOSIS — I359 Nonrheumatic aortic valve disorder, unspecified: Secondary | ICD-10-CM | POA: Diagnosis not present

## 2016-03-24 DIAGNOSIS — D631 Anemia in chronic kidney disease: Secondary | ICD-10-CM | POA: Diagnosis not present

## 2016-03-24 DIAGNOSIS — E876 Hypokalemia: Secondary | ICD-10-CM | POA: Diagnosis not present

## 2016-03-25 ENCOUNTER — Ambulatory Visit (INDEPENDENT_AMBULATORY_CARE_PROVIDER_SITE_OTHER): Payer: Medicare Other | Admitting: Pharmacist

## 2016-03-25 ENCOUNTER — Encounter: Payer: Self-pay | Admitting: Pharmacist

## 2016-03-25 DIAGNOSIS — Z7901 Long term (current) use of anticoagulants: Secondary | ICD-10-CM

## 2016-03-25 NOTE — Telephone Encounter (Signed)
This encounter was created in error - please disregard.

## 2016-03-26 DIAGNOSIS — E876 Hypokalemia: Secondary | ICD-10-CM | POA: Diagnosis not present

## 2016-03-26 DIAGNOSIS — N2581 Secondary hyperparathyroidism of renal origin: Secondary | ICD-10-CM | POA: Diagnosis not present

## 2016-03-26 DIAGNOSIS — E118 Type 2 diabetes mellitus with unspecified complications: Secondary | ICD-10-CM | POA: Diagnosis not present

## 2016-03-26 DIAGNOSIS — D631 Anemia in chronic kidney disease: Secondary | ICD-10-CM | POA: Diagnosis not present

## 2016-03-26 DIAGNOSIS — D509 Iron deficiency anemia, unspecified: Secondary | ICD-10-CM | POA: Diagnosis not present

## 2016-03-26 DIAGNOSIS — N186 End stage renal disease: Secondary | ICD-10-CM | POA: Diagnosis not present

## 2016-03-27 ENCOUNTER — Telehealth: Payer: Self-pay | Admitting: Vascular Surgery

## 2016-03-27 ENCOUNTER — Telehealth: Payer: Self-pay

## 2016-03-27 NOTE — Telephone Encounter (Signed)
-----   Message from Mena Goes, RN sent at 03/26/2016  3:16 PM EST ----- Regarding: RE: Appt Question According to the recent discharge note 03-20-16, [facility] [place] was to let us know when she is medically able to have another thigh graft. She is having a lot of different issues. She is using a Catheter right now and can stay on that until we get clearance from her other doctors.   ----- Message ----- From: Lujean Amel Sent: 03/26/2016   3:05 PM To: [facility] Clinical Pool Subject: Appt Question                                  Hi ladies! This patient's daughter, Cindra Eves called me to inquire when her mother needs a post op appt. She had a ligation of a thigh AVG on 03/11/16 by [facility]. I don't see a staff message for her indicating a follow up appt should be scheduled. Please advise. Thanks, Anne Ng

## 2016-03-27 NOTE — Telephone Encounter (Signed)
Discussed pt. with Dr. Oneida Alar.  Recommended an office visit to reevaluate left heel ulcer; no vascular labs needed.  Will have Scheduler contact daughter with appt.

## 2016-03-27 NOTE — Telephone Encounter (Signed)
I called the patient's daughter [name] and relayed the message from [place] in regards to her care. She still had further medical questions so I transferred her to the triage nurse for assessment. awt

## 2016-03-27 NOTE — Telephone Encounter (Signed)
Daughter called to question when pt. should follow up in office to determine if there has been improvement in blood flow to her left leg.  Stated she understood that her mother would be evaluated in 3-4 weeks, after the ligation of left thigh graft.  Advised will discuss with MD for f/u recommendations and return call to daughter.  Agreed.

## 2016-03-28 DIAGNOSIS — N186 End stage renal disease: Secondary | ICD-10-CM | POA: Diagnosis not present

## 2016-03-28 DIAGNOSIS — D509 Iron deficiency anemia, unspecified: Secondary | ICD-10-CM | POA: Diagnosis not present

## 2016-03-28 DIAGNOSIS — E876 Hypokalemia: Secondary | ICD-10-CM | POA: Diagnosis not present

## 2016-03-28 DIAGNOSIS — N2581 Secondary hyperparathyroidism of renal origin: Secondary | ICD-10-CM | POA: Diagnosis not present

## 2016-03-28 DIAGNOSIS — D631 Anemia in chronic kidney disease: Secondary | ICD-10-CM | POA: Diagnosis not present

## 2016-03-28 DIAGNOSIS — E118 Type 2 diabetes mellitus with unspecified complications: Secondary | ICD-10-CM | POA: Diagnosis not present

## 2016-03-28 NOTE — Telephone Encounter (Signed)
Pt sched for 2/28

## 2016-03-31 DIAGNOSIS — E876 Hypokalemia: Secondary | ICD-10-CM | POA: Diagnosis not present

## 2016-03-31 DIAGNOSIS — I359 Nonrheumatic aortic valve disorder, unspecified: Secondary | ICD-10-CM | POA: Diagnosis not present

## 2016-03-31 DIAGNOSIS — D509 Iron deficiency anemia, unspecified: Secondary | ICD-10-CM | POA: Diagnosis not present

## 2016-03-31 DIAGNOSIS — N2581 Secondary hyperparathyroidism of renal origin: Secondary | ICD-10-CM | POA: Diagnosis not present

## 2016-03-31 DIAGNOSIS — N186 End stage renal disease: Secondary | ICD-10-CM | POA: Diagnosis not present

## 2016-03-31 DIAGNOSIS — D631 Anemia in chronic kidney disease: Secondary | ICD-10-CM | POA: Diagnosis not present

## 2016-03-31 DIAGNOSIS — E118 Type 2 diabetes mellitus with unspecified complications: Secondary | ICD-10-CM | POA: Diagnosis not present

## 2016-03-31 LAB — PROTIME-INR
INR: 4
INR: 4 — AB (ref <=1.1)

## 2016-04-01 ENCOUNTER — Ambulatory Visit (INDEPENDENT_AMBULATORY_CARE_PROVIDER_SITE_OTHER): Payer: Medicare Other | Admitting: Pharmacist Clinician (PhC)/ Clinical Pharmacy Specialist

## 2016-04-01 DIAGNOSIS — Z5181 Encounter for therapeutic drug level monitoring: Secondary | ICD-10-CM | POA: Diagnosis not present

## 2016-04-01 DIAGNOSIS — Z7901 Long term (current) use of anticoagulants: Secondary | ICD-10-CM

## 2016-04-01 DIAGNOSIS — Z79891 Long term (current) use of opiate analgesic: Secondary | ICD-10-CM | POA: Diagnosis not present

## 2016-04-01 DIAGNOSIS — L89620 Pressure ulcer of left heel, unstageable: Secondary | ICD-10-CM | POA: Diagnosis not present

## 2016-04-01 DIAGNOSIS — G4730 Sleep apnea, unspecified: Secondary | ICD-10-CM | POA: Diagnosis not present

## 2016-04-01 DIAGNOSIS — I7389 Other specified peripheral vascular diseases: Secondary | ICD-10-CM | POA: Diagnosis not present

## 2016-04-01 DIAGNOSIS — L89153 Pressure ulcer of sacral region, stage 3: Secondary | ICD-10-CM | POA: Diagnosis not present

## 2016-04-01 DIAGNOSIS — G894 Chronic pain syndrome: Secondary | ICD-10-CM | POA: Diagnosis not present

## 2016-04-01 DIAGNOSIS — I739 Peripheral vascular disease, unspecified: Secondary | ICD-10-CM | POA: Diagnosis not present

## 2016-04-01 DIAGNOSIS — Z6823 Body mass index (BMI) 23.0-23.9, adult: Secondary | ICD-10-CM | POA: Diagnosis not present

## 2016-04-01 LAB — PROTIME-INR: INR: 4 — AB (ref 0.9–1.1)

## 2016-04-02 ENCOUNTER — Encounter: Payer: Self-pay | Admitting: Pharmacist

## 2016-04-02 DIAGNOSIS — D509 Iron deficiency anemia, unspecified: Secondary | ICD-10-CM | POA: Diagnosis not present

## 2016-04-02 DIAGNOSIS — N186 End stage renal disease: Secondary | ICD-10-CM | POA: Diagnosis not present

## 2016-04-02 DIAGNOSIS — R2689 Other abnormalities of gait and mobility: Secondary | ICD-10-CM | POA: Diagnosis not present

## 2016-04-02 DIAGNOSIS — N2581 Secondary hyperparathyroidism of renal origin: Secondary | ICD-10-CM | POA: Diagnosis not present

## 2016-04-02 DIAGNOSIS — I132 Hypertensive heart and chronic kidney disease with heart failure and with stage 5 chronic kidney disease, or end stage renal disease: Secondary | ICD-10-CM | POA: Diagnosis not present

## 2016-04-02 DIAGNOSIS — L89620 Pressure ulcer of left heel, unstageable: Secondary | ICD-10-CM | POA: Diagnosis not present

## 2016-04-02 DIAGNOSIS — I5042 Chronic combined systolic (congestive) and diastolic (congestive) heart failure: Secondary | ICD-10-CM | POA: Diagnosis not present

## 2016-04-02 DIAGNOSIS — E118 Type 2 diabetes mellitus with unspecified complications: Secondary | ICD-10-CM | POA: Diagnosis not present

## 2016-04-02 DIAGNOSIS — Z7901 Long term (current) use of anticoagulants: Secondary | ICD-10-CM

## 2016-04-02 DIAGNOSIS — E1122 Type 2 diabetes mellitus with diabetic chronic kidney disease: Secondary | ICD-10-CM | POA: Diagnosis not present

## 2016-04-02 DIAGNOSIS — L89153 Pressure ulcer of sacral region, stage 3: Secondary | ICD-10-CM | POA: Diagnosis not present

## 2016-04-02 DIAGNOSIS — E876 Hypokalemia: Secondary | ICD-10-CM | POA: Diagnosis not present

## 2016-04-02 DIAGNOSIS — D631 Anemia in chronic kidney disease: Secondary | ICD-10-CM | POA: Diagnosis not present

## 2016-04-02 NOTE — Progress Notes (Signed)
This encounter was created in error - please disregard.

## 2016-04-03 DIAGNOSIS — I132 Hypertensive heart and chronic kidney disease with heart failure and with stage 5 chronic kidney disease, or end stage renal disease: Secondary | ICD-10-CM | POA: Diagnosis not present

## 2016-04-03 DIAGNOSIS — E1122 Type 2 diabetes mellitus with diabetic chronic kidney disease: Secondary | ICD-10-CM | POA: Diagnosis not present

## 2016-04-03 DIAGNOSIS — L89620 Pressure ulcer of left heel, unstageable: Secondary | ICD-10-CM | POA: Diagnosis not present

## 2016-04-03 DIAGNOSIS — R2689 Other abnormalities of gait and mobility: Secondary | ICD-10-CM | POA: Diagnosis not present

## 2016-04-03 DIAGNOSIS — L89153 Pressure ulcer of sacral region, stage 3: Secondary | ICD-10-CM | POA: Diagnosis not present

## 2016-04-03 DIAGNOSIS — I5042 Chronic combined systolic (congestive) and diastolic (congestive) heart failure: Secondary | ICD-10-CM | POA: Diagnosis not present

## 2016-04-04 ENCOUNTER — Encounter: Payer: Self-pay | Admitting: Vascular Surgery

## 2016-04-04 DIAGNOSIS — I5042 Chronic combined systolic (congestive) and diastolic (congestive) heart failure: Secondary | ICD-10-CM | POA: Diagnosis not present

## 2016-04-04 DIAGNOSIS — L89620 Pressure ulcer of left heel, unstageable: Secondary | ICD-10-CM | POA: Diagnosis not present

## 2016-04-04 DIAGNOSIS — N2581 Secondary hyperparathyroidism of renal origin: Secondary | ICD-10-CM | POA: Diagnosis not present

## 2016-04-04 DIAGNOSIS — I132 Hypertensive heart and chronic kidney disease with heart failure and with stage 5 chronic kidney disease, or end stage renal disease: Secondary | ICD-10-CM | POA: Diagnosis not present

## 2016-04-04 DIAGNOSIS — E876 Hypokalemia: Secondary | ICD-10-CM | POA: Diagnosis not present

## 2016-04-04 DIAGNOSIS — D509 Iron deficiency anemia, unspecified: Secondary | ICD-10-CM | POA: Diagnosis not present

## 2016-04-04 DIAGNOSIS — L89153 Pressure ulcer of sacral region, stage 3: Secondary | ICD-10-CM | POA: Diagnosis not present

## 2016-04-04 DIAGNOSIS — R2689 Other abnormalities of gait and mobility: Secondary | ICD-10-CM | POA: Diagnosis not present

## 2016-04-04 DIAGNOSIS — N186 End stage renal disease: Secondary | ICD-10-CM | POA: Diagnosis not present

## 2016-04-04 DIAGNOSIS — E1122 Type 2 diabetes mellitus with diabetic chronic kidney disease: Secondary | ICD-10-CM | POA: Diagnosis not present

## 2016-04-04 DIAGNOSIS — D631 Anemia in chronic kidney disease: Secondary | ICD-10-CM | POA: Diagnosis not present

## 2016-04-04 DIAGNOSIS — E118 Type 2 diabetes mellitus with unspecified complications: Secondary | ICD-10-CM | POA: Diagnosis not present

## 2016-04-07 DIAGNOSIS — D649 Anemia, unspecified: Secondary | ICD-10-CM | POA: Diagnosis not present

## 2016-04-07 DIAGNOSIS — E876 Hypokalemia: Secondary | ICD-10-CM | POA: Diagnosis not present

## 2016-04-07 DIAGNOSIS — J449 Chronic obstructive pulmonary disease, unspecified: Secondary | ICD-10-CM | POA: Diagnosis not present

## 2016-04-07 DIAGNOSIS — F17200 Nicotine dependence, unspecified, uncomplicated: Secondary | ICD-10-CM | POA: Diagnosis not present

## 2016-04-07 DIAGNOSIS — I359 Nonrheumatic aortic valve disorder, unspecified: Secondary | ICD-10-CM | POA: Diagnosis not present

## 2016-04-07 DIAGNOSIS — N2581 Secondary hyperparathyroidism of renal origin: Secondary | ICD-10-CM | POA: Diagnosis not present

## 2016-04-07 DIAGNOSIS — I96 Gangrene, not elsewhere classified: Secondary | ICD-10-CM | POA: Diagnosis not present

## 2016-04-07 DIAGNOSIS — B1920 Unspecified viral hepatitis C without hepatic coma: Secondary | ICD-10-CM | POA: Diagnosis not present

## 2016-04-07 DIAGNOSIS — D509 Iron deficiency anemia, unspecified: Secondary | ICD-10-CM | POA: Diagnosis not present

## 2016-04-07 DIAGNOSIS — I120 Hypertensive chronic kidney disease with stage 5 chronic kidney disease or end stage renal disease: Secondary | ICD-10-CM | POA: Diagnosis not present

## 2016-04-07 DIAGNOSIS — N186 End stage renal disease: Secondary | ICD-10-CM | POA: Diagnosis not present

## 2016-04-07 DIAGNOSIS — I739 Peripheral vascular disease, unspecified: Secondary | ICD-10-CM | POA: Diagnosis not present

## 2016-04-07 DIAGNOSIS — G629 Polyneuropathy, unspecified: Secondary | ICD-10-CM | POA: Diagnosis not present

## 2016-04-07 DIAGNOSIS — L988 Other specified disorders of the skin and subcutaneous tissue: Secondary | ICD-10-CM | POA: Diagnosis not present

## 2016-04-07 DIAGNOSIS — I499 Cardiac arrhythmia, unspecified: Secondary | ICD-10-CM | POA: Diagnosis not present

## 2016-04-07 DIAGNOSIS — E118 Type 2 diabetes mellitus with unspecified complications: Secondary | ICD-10-CM | POA: Diagnosis not present

## 2016-04-07 DIAGNOSIS — D631 Anemia in chronic kidney disease: Secondary | ICD-10-CM | POA: Diagnosis not present

## 2016-04-08 LAB — PROTIME-INR: INR: 2.7 — AB (ref 0.9–1.1)

## 2016-04-09 ENCOUNTER — Ambulatory Visit: Payer: Medicare Other | Admitting: Vascular Surgery

## 2016-04-09 ENCOUNTER — Ambulatory Visit (INDEPENDENT_AMBULATORY_CARE_PROVIDER_SITE_OTHER): Payer: Medicare Other | Admitting: Pharmacist

## 2016-04-09 DIAGNOSIS — T8640 Unspecified complication of liver transplant: Secondary | ICD-10-CM | POA: Diagnosis not present

## 2016-04-09 DIAGNOSIS — Z7901 Long term (current) use of anticoagulants: Secondary | ICD-10-CM

## 2016-04-09 DIAGNOSIS — D509 Iron deficiency anemia, unspecified: Secondary | ICD-10-CM | POA: Diagnosis not present

## 2016-04-09 DIAGNOSIS — N186 End stage renal disease: Secondary | ICD-10-CM | POA: Diagnosis not present

## 2016-04-09 DIAGNOSIS — D631 Anemia in chronic kidney disease: Secondary | ICD-10-CM | POA: Diagnosis not present

## 2016-04-09 DIAGNOSIS — E876 Hypokalemia: Secondary | ICD-10-CM | POA: Diagnosis not present

## 2016-04-09 DIAGNOSIS — Z992 Dependence on renal dialysis: Secondary | ICD-10-CM | POA: Diagnosis not present

## 2016-04-09 DIAGNOSIS — E118 Type 2 diabetes mellitus with unspecified complications: Secondary | ICD-10-CM | POA: Diagnosis not present

## 2016-04-09 DIAGNOSIS — N2581 Secondary hyperparathyroidism of renal origin: Secondary | ICD-10-CM | POA: Diagnosis not present

## 2016-04-10 ENCOUNTER — Ambulatory Visit (INDEPENDENT_AMBULATORY_CARE_PROVIDER_SITE_OTHER): Payer: Medicare Other | Admitting: Family

## 2016-04-10 ENCOUNTER — Encounter: Payer: Self-pay | Admitting: Family

## 2016-04-10 VITALS — BP 153/70 | HR 78 | Temp 97.4°F | Resp 16 | Ht 64.5 in | Wt 128.0 lb

## 2016-04-10 DIAGNOSIS — N186 End stage renal disease: Secondary | ICD-10-CM | POA: Diagnosis not present

## 2016-04-10 DIAGNOSIS — I70244 Atherosclerosis of native arteries of left leg with ulceration of heel and midfoot: Secondary | ICD-10-CM | POA: Diagnosis not present

## 2016-04-10 DIAGNOSIS — L89620 Pressure ulcer of left heel, unstageable: Secondary | ICD-10-CM | POA: Diagnosis not present

## 2016-04-10 DIAGNOSIS — F17200 Nicotine dependence, unspecified, uncomplicated: Secondary | ICD-10-CM

## 2016-04-10 DIAGNOSIS — R2689 Other abnormalities of gait and mobility: Secondary | ICD-10-CM | POA: Diagnosis not present

## 2016-04-10 DIAGNOSIS — I5042 Chronic combined systolic (congestive) and diastolic (congestive) heart failure: Secondary | ICD-10-CM | POA: Diagnosis not present

## 2016-04-10 DIAGNOSIS — L89153 Pressure ulcer of sacral region, stage 3: Secondary | ICD-10-CM | POA: Diagnosis not present

## 2016-04-10 DIAGNOSIS — Z944 Liver transplant status: Secondary | ICD-10-CM | POA: Diagnosis not present

## 2016-04-10 DIAGNOSIS — Z5181 Encounter for therapeutic drug level monitoring: Secondary | ICD-10-CM | POA: Diagnosis not present

## 2016-04-10 DIAGNOSIS — I132 Hypertensive heart and chronic kidney disease with heart failure and with stage 5 chronic kidney disease, or end stage renal disease: Secondary | ICD-10-CM | POA: Diagnosis not present

## 2016-04-10 DIAGNOSIS — E1122 Type 2 diabetes mellitus with diabetic chronic kidney disease: Secondary | ICD-10-CM | POA: Diagnosis not present

## 2016-04-10 NOTE — Patient Instructions (Signed)
Peripheral Vascular Disease Peripheral vascular disease (PVD) is a disease of the blood vessels that are not part of your heart and brain. A simple term for PVD is poor circulation. In most cases, PVD narrows the blood vessels that carry blood from your heart to the rest of your body. This can result in a decreased supply of blood to your arms, legs, and internal organs, like your stomach or kidneys. However, it most often affects a person's lower legs and feet. There are two types of PVD.  Organic PVD. This is the more common type. It is caused by damage to the structure of blood vessels.  Functional PVD. This is caused by conditions that make blood vessels contract and tighten (spasm). Without treatment, PVD tends to get worse over time. PVD can also lead to acute ischemic limb. This is when an arm or limb suddenly has trouble getting enough blood. This is a medical emergency. Follow these instructions at home:  Take medicines only as told by your doctor.  Do not use any tobacco products, including cigarettes, chewing tobacco, or electronic cigarettes. If you need help quitting, ask your doctor.  Lose weight if you are overweight, and maintain a healthy weight as told by your doctor.  Eat a diet that is low in fat and cholesterol. If you need help, ask your doctor.  Exercise regularly. Ask your doctor for some good activities for you.  Take good care of your feet.  Wear comfortable shoes that fit well.  Check your feet often for any cuts or sores. Contact a doctor if:  You have cramps in your legs while walking.  You have leg pain when you are at rest.  You have coldness in a leg or foot.  Your skin changes.  You are unable to get or have an erection (erectile dysfunction).  You have cuts or sores on your feet that are not healing. Get help right away if:  Your arm or leg turns cold and blue.  Your arms or legs become red, warm, swollen, painful, or numb.  You have  chest pain or trouble breathing.  You suddenly have weakness in your face, arm, or leg.  You become very confused or you cannot speak.  You suddenly have a very bad headache.  You suddenly cannot see. This information is not intended to replace advice given to you by your health care provider. Make sure you discuss any questions you have with your health care provider. Document Released: 04/23/2009 Document Revised: 07/05/2015 Document Reviewed: 07/07/2013 Elsevier Interactive Patient Education  2017 Elsevier Inc.      Steps to Quit Smoking Smoking tobacco can be bad for your health. It can also affect almost every organ in your body. Smoking puts you and people around you at risk for many serious long-lasting (chronic) diseases. Quitting smoking is hard, but it is one of the best things that you can do for your health. It is never too late to quit. What are the benefits of quitting smoking? When you quit smoking, you lower your risk for getting serious diseases and conditions. They can include:  Lung cancer or lung disease.  Heart disease.  Stroke.  Heart attack.  Not being able to have children (infertility).  Weak bones (osteoporosis) and broken bones (fractures). If you have coughing, wheezing, and shortness of breath, those symptoms may get better when you quit. You may also get sick less often. If you are pregnant, quitting smoking can help to lower your chances   of having a baby of low birth weight. What can I do to help me quit smoking? Talk with your doctor about what can help you quit smoking. Some things you can do (strategies) include:  Quitting smoking totally, instead of slowly cutting back how much you smoke over a period of time.  Going to in-person counseling. You are more likely to quit if you go to many counseling sessions.  Using resources and support systems, such as:  Online chats with a counselor.  Phone quitlines.  Printed self-help  materials.  Support groups or group counseling.  Text messaging programs.  Mobile phone apps or applications.  Taking medicines. Some of these medicines may have nicotine in them. If you are pregnant or breastfeeding, do not take any medicines to quit smoking unless your doctor says it is okay. Talk with your doctor about counseling or other things that can help you. Talk with your doctor about using more than one strategy at the same time, such as taking medicines while you are also going to in-person counseling. This can help make quitting easier. What things can I do to make it easier to quit? Quitting smoking might feel very hard at first, but there is a lot that you can do to make it easier. Take these steps:  Talk to your family and friends. Ask them to support and encourage you.  Call phone quitlines, reach out to support groups, or work with a counselor.  Ask people who smoke to not smoke around you.  Avoid places that make you want (trigger) to smoke, such as:  Bars.  Parties.  Smoke-break areas at work.  Spend time with people who do not smoke.  Lower the stress in your life. Stress can make you want to smoke. Try these things to help your stress:  Getting regular exercise.  Deep-breathing exercises.  Yoga.  Meditating.  Doing a body scan. To do this, close your eyes, focus on one area of your body at a time from head to toe, and notice which parts of your body are tense. Try to relax the muscles in those areas.  Download or buy apps on your mobile phone or tablet that can help you stick to your quit plan. There are many free apps, such as QuitGuide from the CDC (Centers for Disease Control and Prevention). You can find more support from smokefree.gov and other websites. This information is not intended to replace advice given to you by your health care provider. Make sure you discuss any questions you have with your health care provider. Document Released:  11/23/2008 Document Revised: 09/25/2015 Document Reviewed: 06/13/2014 Elsevier Interactive Patient Education  2017 Elsevier Inc.  

## 2016-04-10 NOTE — Progress Notes (Signed)
[facility] [place]   CC: Follow up peripheral artery occlusive disease  History of Present Illness [name] is a 61 y.o. female who is s/p  Surgeon:Brabham, [place] Procedure Performed: 1. Ultrasound-guided access, right femoral artery 2. Ultrasound-guided placement of a right common femoral vein central venous catheter 3. Abdominal aortogram 4. Bilateral lower extremity runoff 5. Atherectomy, left superficial femoral artery 6. Drug coated balloon angioplasty, left superficial femoral artery 7. Conscious sedation (90 minutes)   She was last evaluated by Dr. Donnetta Hutching and Jerilynn Mages. Collins PA-C on 11-20-15. At that time pt c/o heel pain and continued to have heel pain.  She had a significant improvement in blood flow to her left LE.  She was on gabapentin 300 mg dose that does not help her foot pain, but at a dose of 600 mg her pain was better.  She was possibly reduced to 300 mg due to decreased liver function.  She may benefit from a trial with Lyrica.    She wants to talk to her Primary care physician about her options and that the pain may be nerve pain.   She was to f/u with Dr. Donnetta Hutching in 3 months for repeat ABI's and arterial Duplex. The patient had easily palpable popliteal pulse and had biphasic Doppler flow with excellent signals at her foot. Still having pain of questionable etiology. Very complex medical patient. Dr. Donnetta Hutching did not feel this is related to ischemia. Pt was to see [place] again in 3 months for continued follow-up.  She returns today with c/o left heel ulcer for over a month and painful left foot. Daughter states the black left toes are less black than they were.   She dialyzes M-W-F via right side chest catheter at [facility].  Left thigh AV graft was taken down to help perfuse her left foot. She sees [place].  She is under the care of [facility]  wound care.  She sees [facility], [employer], Pain management clinic for pain in feet, hands, and back. She states she takes Dilaudid for pain.  She continues to smoke.    Pt Diabetic: Yes, seemes to be controlled on insulin and HD Pt smoker: smoker  (1/2 ppd x 40 yrs)  Pt meds include: Statin :No Betablocker: Yes ASA: Yes Other anticoagulants/antiplatelets: warfarin, history of aortic valve replacement   Past Medical History:  Diagnosis Date  . Anemia    takes Folic Acid daily  . Anxiety   . Aortic stenosis   . Arthritis    "left hand, back" (08/30/2012)  . Asthma   . CAD (coronary artery disease)   . CAD (coronary artery disease) Jan. 2015   Cath: 20% LAD, 50% D1; s/p LIMA-LAD  . Chest pain 03/06/2015  . Chronic back pain   . Chronic bronchitis ([place])    "q yr w/season changes" (08/30/2012)  . Chronic constipation    takes MIralax and Colace daily  . COPD (chronic obstructive pulmonary disease) ([place])   . Depression    takes Cymbalta for "severe" depression  . End stage renal disease on dialysis [facility]) 02/27/2011   Kidneys shut down at time of liver transplant in Sept 2011 at [facility] in [place], she has been on HD ever since.  Dialyzes at [facility] HD on TTS schedule.  Had L forearm graft used 10 months then removed Dec 2012 due to suspected infection.  A right upper arm AVG was placed Dec 2012 but she developed steal symptoms  acutely and it was ligated the same day.  Never had an AV fistula due to small veins.  Now has L thigh AVG put in Jan 2013, has not clotted to date.    [name] GERD (gastroesophageal reflux disease)    takes Omeprazole daily  . Headache    "at least monthly" (08/30/2012)  . Hepatitis C   . History of blood transfusion    "several" (08/30/2012)  . Hypertension    takes Metoprolol and Lisinopril daily, sees Dr Bea Graff  . Hypothyroidism    takes Synthroid daily  . Migraine    "last migraine was in 2013" (08/30/2012)  . Neuromuscular disorder ([place])     carpal tunnel in right hand  . Obesity   . Peripheral vascular disease (HCC) hands and legs  . Pneumonia    "today and several times before" (08/30/2012)  . 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 SVT (supraventricular tachycardia) ([place]) 06/09/14  . Tobacco abuse   . Type II diabetes mellitus (HCC)    Levemir 2units daily if > 150    Social History Social History  Substance Use Topics  . Smoking status: Current Every Day Smoker    Packs/day: 0.50    Years: 40.00    Types: Cigarettes  . Smokeless tobacco: Never Used     Comment: Almost 1 pk per day  . Alcohol use No    Family History Family History  Problem Relation Age of Onset  . Cancer Mother   . Diabetes Mother   . Hypertension Mother   . Stroke Mother   . Cancer Father   . Anesthesia problems Neg Hx   . Hypotension Neg Hx   . Malignant hyperthermia Neg Hx   . Pseudochol deficiency Neg Hx     Past Surgical History:  Procedure Laterality Date  . AORTIC VALVE REPLACEMENT N/A 03/15/2013   AVR; Surgeon: Ivin Poot, MD;  Location: [facility] OR; Open Heart Surgery;  53mm[employer] mechanical prosthesis, top hat valve  . ARTERIOVENOUS GRAFT PLACEMENT Left 10/03/10    forearm  . AV FISTULA PLACEMENT  01/29/2011   Procedure: INSERTION OF ARTERIOVENOUS (AV) GORE-TEX GRAFT ARM;  Surgeon: Elam Dutch, MD;  Location: [facility] OR;  Service: Vascular;  Laterality: Right;  . AV FISTULA PLACEMENT  03/10/2011   Procedure: INSERTION OF ARTERIOVENOUS (AV) GORE-TEX GRAFT THIGH;  Surgeon: Elam Dutch, MD;  Location: [place];  Service: Vascular;  Laterality: Left;  . [place] REMOVAL  12/23/2010   Procedure: REMOVAL OF ARTERIOVENOUS GORETEX GRAFT ([place]);  Surgeon: Elam Dutch, MD;  Location: [place];  Service: Vascular;  Laterality: Left;  procedure started @1736 [id number]  . CHOLECYSTECTOMY  1993  . CORONARY  ARTERY BYPASS GRAFT N/A 03/15/2013   Procedure: CORONARY ARTERY BYPASS GRAFTING (CABG) times one using left internal mammary artery.;  Surgeon: Ivin Poot, MD;  Location: [place];  Service: Open Heart Surgery;  Laterality: N/A;  POSS CABG X 1  . CYSTOSCOPY  1990's  . INSERTION OF DIALYSIS CATHETER  12/23/2010   Procedure: INSERTION OF DIALYSIS CATHETER;  Surgeon: Elam Dutch, MD;  Location: [place];  Service: Vascular;  Laterality: Right;  Right Internal Jugular 28cm dialysis catheter insertion procedure time 1701-1720   . INSERTION OF DIALYSIS CATHETER Right 03/11/2016   Procedure: INSERTION OF DIALYSIS CATHETER;  Surgeon: Angelia Mould, MD;  Location: [place];  Service: Vascular;  Laterality: Right;  . INTRAOPERATIVE TRANSESOPHAGEAL ECHOCARDIOGRAM N/A 03/15/2013   Procedure: INTRAOPERATIVE TRANSESOPHAGEAL ECHOCARDIOGRAM;  Surgeon: Ivin Poot, MD;  Location: [place];  Service: Open Heart Surgery;  Laterality: N/A;  . LEFT HEART CATHETERIZATION WITH CORONARY ANGIOGRAM N/A 07/29/2012   Procedure: LEFT HEART CATHETERIZATION WITH CORONARY ANGIOGRAM;  Surgeon: Troy Sine, MD;  Location: [facility] CATH LAB;  Service: Cardiovascular;  Laterality: N/A;  . LEFT HEART CATHETERIZATION WITH CORONARY ANGIOGRAM N/A 03/10/2013   Procedure: LEFT HEART CATHETERIZATION WITH CORONARY ANGIOGRAM;  Surgeon: Troy Sine, MD;  Location: [facility] CATH LAB;  Service: Cardiovascular;  Laterality: N/A;  . LEFT HEART CATHETERIZATION WITH CORONARY/GRAFT ANGIOGRAM N/A 12/24/2011   Procedure: LEFT HEART CATHETERIZATION WITH Beatrix Fetters;  Surgeon: [name], MD;  Location: [facility] CATH LAB;  Service: Cardiovascular;  Laterality: N/A;  . LEFT HEART CATHETERIZATION WITH CORONARY/GRAFT ANGIOGRAM N/A 12/16/2013   Procedure: LEFT HEART CATHETERIZATION WITH Beatrix Fetters;  Surgeon: Troy Sine, MD;  Location: [facility] CATH LAB;  Service: Cardiovascular;  Laterality: N/A;  . LIGATION ARTERIOVENOUS GORTEX GRAFT  Left 03/11/2016   Procedure: LIGATION THIGH ARTERIOVENOUS GORTEX GRAFT;  Surgeon: Angelia Mould, MD;  Location: [place];  Service: Vascular;  Laterality: Left;  . LIVER TRANSPLANT  10/25/2009   sees Dr Ferol Luz 1 every 6 months, saw last in Dec 2013. [name] Coord [phone]  . PERIPHERAL VASCULAR CATHETERIZATION N/A 11/06/2015   Procedure: Abdominal Aortogram;  Surgeon: Serafina Mitchell, MD;  Location: [place] CV LAB;  Service: Cardiovascular;  Laterality: N/A;  . PERIPHERAL VASCULAR CATHETERIZATION N/A 11/06/2015   Procedure: Lower Extremity Angiography;  Surgeon: Serafina Mitchell, MD;  Location: [place] CV LAB;  Service: Cardiovascular;  Laterality: N/A;  . PERIPHERAL VASCULAR CATHETERIZATION  11/06/2015   Procedure: Peripheral Vascular Atherectomy;  Surgeon: Serafina Mitchell, MD;  Location: [place] CV LAB;  Service: Cardiovascular;;  Left Superficial femoral  . SHUNTOGRAM Left 05/15/2014   Procedure: SHUNTOGRAM;  Surgeon: Conrad Morrow, MD;  Location: [facility] CATH LAB;  Service: Cardiovascular;  Laterality: Left;  . SMALL INTESTINE SURGERY  90's  . SPINAL GROWTH RODS  2010   "put 2 metal rods in my back; they had detetriorated" (08/30/2012)  . THROMBECTOMY    . THROMBECTOMY AND REVISION OF ARTERIOVENTOUS (AV) GORETEX  GRAFT Left 03/30/2014  . THROMBECTOMY AND REVISION OF ARTERIOVENTOUS (AV) GORETEX  GRAFT Left 03/30/2014   Procedure: THROMBECTOMY AND REVISION OF ARTERIOVENTOUS (AV) GORETEX  GRAFT;  Surgeon: Conrad [employer], MD;  Location: [place];  Service: Vascular;  Laterality: Left;  . TUBAL LIGATION  1990's    Allergies  Allergen Reactions  . Acetaminophen Other (See Comments)    Liver transplant recipient   . Codeine Itching  . Mirtazapine Other (See Comments)    hallucination  . Morphine Itching and Other (See Comments)    hallucinate    Current Outpatient Prescriptions  Medication Sig Dispense Refill  . aspirin EC 81 MG tablet Take 81 mg by mouth daily.    . calcium  acetate (PHOSLO) 667 MG capsule Take 1 capsule (667 mg total) by mouth 3 (three) times daily with meals. 30 capsule 1  . gabapentin (NEURONTIN) 300 MG capsule Take 300 mg by mouth every evening.    [name] HYDROmorphone (DILAUDID) 2 MG tablet Take 2 mg by mouth.    . Insulin Detemir (LEVEMIR FLEXPEN) 100 UNIT/ML Pen Inject 2-6 Units into the  skin See admin instructions. Only if cbg is over 200    . lidocaine (LIDODERM) 5 % Place 3 patches onto the skin daily. Remove & Discard patch within 12 hours or as directed by MD 30 patch 0  . metoprolol tartrate (LOPRESSOR) 25 MG tablet Take 25 mg by mouth 2 (two) times daily.    . nitroGLYCERIN (NITROSTAT) 0.4 MG SL tablet Place 1 tablet (0.4 mg total) under the tongue every 5 (five) minutes as needed for chest pain. 30 tablet 0  . ondansetron (ZOFRAN ODT) 4 MG disintegrating tablet Take 1 tablet (4 mg total) by mouth every 8 (eight) hours as needed for nausea. 6 tablet 0  . senna-docusate (SENOKOT-S) 8.6-50 MG tablet Take 1 tablet by mouth daily.    . tacrolimus (PROGRAF) 1 MG capsule Take 2 capsules (2 mg total) by mouth 2 (two) times daily. Take 2 capsules twice daily. 120 capsule 0  . warfarin (COUMADIN) 2 MG tablet Take 1- 2 tablets by mouth daily or as directed by coumadin clinic (Patient taking differently: Take 2 mg by mouth daily. Take as directed by coumadin clinic) 45 tablet 3   No current facility-administered medications for this visit.     ROS: See HPI for pertinent positives and negatives.   Physical Examination  Vitals:   04/10/16 1134 04/10/16 1139  BP: (!) 150/68 (!) 153/70  Pulse: 76 78  Resp: 16   Temp: 97.4 F (36.3 C)   TempSrc: Oral   SpO2: 94%   Weight: 128 lb (58.1 kg)   Height: 5' 4.5" (1.638 m)    Body mass index is 21.63 kg/m.  General: A&O x 3, WDWN, thin female. Gait: seated in w/c Eyes: PERRLA. Pulmonary: Respirations are non labored, CTAB, good air movement Cardiac: regular rhythm, no detected murmur.          Carotid Bruits Right Left   Negative Negative  Aorta is not palpable. Radial pulses: 1+ palpable                           VASCULAR EXAM: Extremities with ischemic changes,  without Gangrene; with left heel ulcer that measures about 3 cm x 4 cm, eschar in the center, moist surrounding tissue, no drainage, no purulence. All left toes are black but not gangrenous, very tender to touch. No ulcers or ischemic appearance in right foot.                                                                                                           LE Pulses Right Left       FEMORAL  3+ palpable  3+ palpable        POPLITEAL  not palpable   not palpable       POSTERIOR TIBIAL  not palpable, no Doppler signal   not palpable, brisk Doppler signal        DORSALIS PEDIS      ANTERIOR TIBIAL not palpable, brisk Doppler signal  not palpable, brisk Doppler signal  PERONEAL not Palpable, no Doppler signal   not Palpable, brisk Doppler signal    Abdomen: soft, NT, no palpable masses. Skin: no rashes, see Extremities.  Musculoskeletal: no muscle wasting or atrophy.  Neurologic: A&O X 3; Appropriate Affect ; SENSATION: normal; MOTOR FUNCTION:  moving all extremities equally, motor strength 5/5 throughout. Speech is fluent/normal. CN 2-12 intact.    ASSESSMENT: [name] is a 61 y.o. female who presents with: ischemic pain of left foot with non healing ulcer at left heel. Dr. Oneida Alar spoke with and examined pt.  Pt has no revascularization options to her lower extremities. There are strong Doppler signals in her left PT, DP, and peroneal arteries.  Pt will let us know if she is ready for a left BKA to remove the source of ischemic pain.   Her atherosclerotic risk factors include continued active smoking for 40 years, DM, CAD, and ESRD.    PLAN:  Continue efforts at wound care clinic to heal ulcer at left heel as long as pt wants to pursue this.  The patient was counseled re smoking  cessation and given several free resources re smoking cessation.   Based on the patient's vascular studies and examination, pt will return to clinic prn  I discussed in depth with the patient the nature of atherosclerosis, and emphasized the importance of maximal medical management including strict control of blood pressure, blood glucose, and lipid levels, obtaining regular exercise, and cessation of smoking.  The patient is aware that without maximal medical management the underlying atherosclerotic disease process will progress, limiting the benefit of any interventions.  The patient was given information about PAD including signs, symptoms, treatment, what symptoms should prompt the patient to seek immediate medical care, and risk reduction measures to take.  Clemon Chambers, RN, MSN, FNP-C [facility] Phone: [phone]  Clinic MD: Oneida Alar  04/10/16 12:10 PM

## 2016-04-11 DIAGNOSIS — N186 End stage renal disease: Secondary | ICD-10-CM | POA: Diagnosis not present

## 2016-04-11 DIAGNOSIS — N2581 Secondary hyperparathyroidism of renal origin: Secondary | ICD-10-CM | POA: Diagnosis not present

## 2016-04-14 DIAGNOSIS — N2581 Secondary hyperparathyroidism of renal origin: Secondary | ICD-10-CM | POA: Diagnosis not present

## 2016-04-14 DIAGNOSIS — M79672 Pain in left foot: Secondary | ICD-10-CM | POA: Diagnosis not present

## 2016-04-14 DIAGNOSIS — I739 Peripheral vascular disease, unspecified: Secondary | ICD-10-CM | POA: Diagnosis not present

## 2016-04-14 DIAGNOSIS — S91302A Unspecified open wound, left foot, initial encounter: Secondary | ICD-10-CM | POA: Diagnosis not present

## 2016-04-14 DIAGNOSIS — I359 Nonrheumatic aortic valve disorder, unspecified: Secondary | ICD-10-CM | POA: Diagnosis not present

## 2016-04-14 DIAGNOSIS — I96 Gangrene, not elsewhere classified: Secondary | ICD-10-CM | POA: Diagnosis not present

## 2016-04-14 DIAGNOSIS — N186 End stage renal disease: Secondary | ICD-10-CM | POA: Diagnosis not present

## 2016-04-14 LAB — PROTIME-INR: INR: 3.5 — AB (ref <=1.1)

## 2016-04-15 DIAGNOSIS — L89620 Pressure ulcer of left heel, unstageable: Secondary | ICD-10-CM | POA: Diagnosis not present

## 2016-04-15 DIAGNOSIS — R2689 Other abnormalities of gait and mobility: Secondary | ICD-10-CM | POA: Diagnosis not present

## 2016-04-15 DIAGNOSIS — I739 Peripheral vascular disease, unspecified: Secondary | ICD-10-CM | POA: Diagnosis not present

## 2016-04-15 DIAGNOSIS — L89153 Pressure ulcer of sacral region, stage 3: Secondary | ICD-10-CM | POA: Diagnosis not present

## 2016-04-15 DIAGNOSIS — E1122 Type 2 diabetes mellitus with diabetic chronic kidney disease: Secondary | ICD-10-CM | POA: Diagnosis not present

## 2016-04-15 DIAGNOSIS — I132 Hypertensive heart and chronic kidney disease with heart failure and with stage 5 chronic kidney disease, or end stage renal disease: Secondary | ICD-10-CM | POA: Diagnosis not present

## 2016-04-15 DIAGNOSIS — I5042 Chronic combined systolic (congestive) and diastolic (congestive) heart failure: Secondary | ICD-10-CM | POA: Diagnosis not present

## 2016-04-16 ENCOUNTER — Ambulatory Visit (INDEPENDENT_AMBULATORY_CARE_PROVIDER_SITE_OTHER): Payer: Medicare Other | Admitting: Pharmacist

## 2016-04-16 DIAGNOSIS — N186 End stage renal disease: Secondary | ICD-10-CM | POA: Diagnosis not present

## 2016-04-16 DIAGNOSIS — Z7901 Long term (current) use of anticoagulants: Secondary | ICD-10-CM

## 2016-04-16 DIAGNOSIS — N2581 Secondary hyperparathyroidism of renal origin: Secondary | ICD-10-CM | POA: Diagnosis not present

## 2016-04-17 ENCOUNTER — Ambulatory Visit (INDEPENDENT_AMBULATORY_CARE_PROVIDER_SITE_OTHER): Payer: Medicare Other | Admitting: Pharmacist Clinician (PhC)/ Clinical Pharmacy Specialist

## 2016-04-17 ENCOUNTER — Ambulatory Visit (INDEPENDENT_AMBULATORY_CARE_PROVIDER_SITE_OTHER): Payer: Medicare Other | Admitting: Cardiovascular Disease

## 2016-04-17 ENCOUNTER — Encounter: Payer: Self-pay | Admitting: Cardiovascular Disease

## 2016-04-17 ENCOUNTER — Other Ambulatory Visit: Payer: Self-pay | Admitting: *Deleted

## 2016-04-17 VITALS — BP 130/68 | HR 74 | Ht 64.5 in | Wt 129.4 lb

## 2016-04-17 DIAGNOSIS — N186 End stage renal disease: Secondary | ICD-10-CM

## 2016-04-17 DIAGNOSIS — I471 Supraventricular tachycardia: Secondary | ICD-10-CM | POA: Diagnosis not present

## 2016-04-17 DIAGNOSIS — E11622 Type 2 diabetes mellitus with other skin ulcer: Secondary | ICD-10-CM | POA: Diagnosis not present

## 2016-04-17 DIAGNOSIS — I132 Hypertensive heart and chronic kidney disease with heart failure and with stage 5 chronic kidney disease, or end stage renal disease: Secondary | ICD-10-CM | POA: Diagnosis not present

## 2016-04-17 DIAGNOSIS — E038 Other specified hypothyroidism: Secondary | ICD-10-CM | POA: Diagnosis not present

## 2016-04-17 DIAGNOSIS — Z01818 Encounter for other preprocedural examination: Secondary | ICD-10-CM | POA: Diagnosis not present

## 2016-04-17 DIAGNOSIS — Z952 Presence of prosthetic heart valve: Secondary | ICD-10-CM

## 2016-04-17 DIAGNOSIS — I2510 Atherosclerotic heart disease of native coronary artery without angina pectoris: Secondary | ICD-10-CM

## 2016-04-17 DIAGNOSIS — I96 Gangrene, not elsewhere classified: Secondary | ICD-10-CM

## 2016-04-17 DIAGNOSIS — I5042 Chronic combined systolic (congestive) and diastolic (congestive) heart failure: Secondary | ICD-10-CM | POA: Diagnosis not present

## 2016-04-17 DIAGNOSIS — L89620 Pressure ulcer of left heel, unstageable: Secondary | ICD-10-CM | POA: Diagnosis not present

## 2016-04-17 DIAGNOSIS — Z7901 Long term (current) use of anticoagulants: Secondary | ICD-10-CM | POA: Diagnosis not present

## 2016-04-17 DIAGNOSIS — Z951 Presence of aortocoronary bypass graft: Secondary | ICD-10-CM | POA: Diagnosis not present

## 2016-04-17 DIAGNOSIS — E1122 Type 2 diabetes mellitus with diabetic chronic kidney disease: Secondary | ICD-10-CM | POA: Diagnosis not present

## 2016-04-17 DIAGNOSIS — I10 Essential (primary) hypertension: Secondary | ICD-10-CM | POA: Diagnosis not present

## 2016-04-17 DIAGNOSIS — L89153 Pressure ulcer of sacral region, stage 3: Secondary | ICD-10-CM | POA: Diagnosis not present

## 2016-04-17 DIAGNOSIS — I739 Peripheral vascular disease, unspecified: Secondary | ICD-10-CM | POA: Diagnosis not present

## 2016-04-17 DIAGNOSIS — Z992 Dependence on renal dialysis: Secondary | ICD-10-CM

## 2016-04-17 DIAGNOSIS — R2689 Other abnormalities of gait and mobility: Secondary | ICD-10-CM | POA: Diagnosis not present

## 2016-04-17 LAB — POCT INR: INR: 3

## 2016-04-17 NOTE — Progress Notes (Signed)
Patient ID: [name], female   DOB: 22-Mar-1955, 61 y.o.   MRN: [id number]    HPI: [name] is a 61 y.o. African American  female who presents to the office today for a 4 month follow up cardiology evaluation.  [name] has end-stage renal disease and is on chronic hemodialysis.  After undergoing liver transplantation at [facility] for hepatitis C,  she was admitted to [facility] in January 2015 with increasing chest pain.  A dobutamine echo study suggested severe AS with an ejection fraction of 45%.  Cardiac catheterization revealed mild CAD with 50% narrowing in moderately severe aortic stenosis with aortic valve area in the range of 0.8-1.2.  She ultimately underwent aortic valve replacement with mechanical aortic valve and single vessel LIMA to LAD CABG revascularization surgery on 03/15/2013 by Dr. [name].  Following discharge, she presented to the hospital for further evaluation of chest pain and palpitations.  Troponins were negative.  Beta blocker therapy was increased to following discharge.   She undergoes dialysis on Tuesday, Thursdays, and Saturdays.  She was  hospitalized from November 5 through 12/21/2013 and presented with chest pain episodes which occurred during dialysis.  Her ECG showed new T-wave inversion in inferior leads.  She underwent a repeat cardiac catheterization on November 6 by me which showed a focal 70% mid LAD stenosis after the takeoff of the second diagonal vessel, but the LIMA graft to the mid LAD was patent.  The left circumflex vessel had 20% smooth narrowing in the second marginal branch.  The large dominant RCA was normal.  Medical therapy was recommended.  She was re-coumadinized prior to discharge.  She was found to have AV graft bleeding/degeneration and underwent hemodialysis graft revision of her left thigh AV graft.  She was on heparin therapy during that time and ultimately was restarted back on Coumadin.  She was hospitalized from  05/11/2014 through 05/20/2014 on the vascular service with complications due to renal dialysis device implant and graft.  She also was seen in the emergency room with paroxysmal SVT which was treated with adenosine in 06/09/2014 and was seen on 06/17/2014 with mild chest pain and palpitations.    She underwent a sleep study to evaluate for sleep apnea.  She sleeps very poorly, admits to fatigue and daytime sleepiness.  Her sleep study was abnormal and demonstrated a significant increase in the respiratory disturbance index at 61.3/h, although the AHI was calculated at just 3.7/h.  She had a total of 9 obstructive apneas, 10 central apneas and 1 mixed apnea.  Her sleep architecture was abnormal with absence of slow-wave sleep and prolonged latency 2 REM sleep.  She had moderate snoring.  She did not have significant oxygen desaturation.   She is undergoing dialysis on Tuesday, Thursdays and Saturdays.  She believes she does not sleep as well on the days that she does not have dialysis admits to some shortness of breath nocturnally.  She is unaware where of any recurrent episodes of supraventricular tachycardia.  She was hospitalized at [facility]  on 2 occasions in April and again in May 2017.  I reviewed her records from [facility] via care everywhere.  She was found to have a significant  left subcapsular hematoma of her kidney.  She ultimately underwent left kidney nephrectomy.. An echo Doppler study on 05/18/2015 showed normal to low normal LV function.  There was a least moderate perivalvular leak consistent with aortic regurgitation.  A subsequent echo on  06/19/2015 showed improvement with an EF of 5055% and only trace aortic regurgitation.  She has a mechanical aortic valve.  The pressure gradient was 33 mm peek with a mean of 15 mm.  Since I last saw her, she was hospitalized in November 2017 at [facility] with acute liver transplant rejection.  Her bilirubin had increased to 5.7 and she had undergone  liver biopsy in October, concerning for acute transplant rejection versus infection.  She was given multiple doses of Solu-Medrol with improvement of LFTs and bilirubin.  She is followed by the hepatology department at [facility].  She underwent ERCP on November 15 which demonstrated anastomotic stricture which was dilated followed by stent placement.  She was started on Levaquin post ERCP.  When I last saw her in December 2017 she had developed  an episode of SVT at a heart rate of 175 with dialysis and I suggested the addition of metoprolol 25 g twice a day to her medical regimen.  Since her last evaluation, she was hospitalized in January 2018 with sepsis.  There was plan for her to have a [place] study but due to development of fever .  This was never done.  She completed a regimen of vancomycin and cefepime.  An echo Doppler study on general 27 2018 showed an EF of 40-45%.  She had grade 3 diastolic dysfunction with a reversible obstructive pattern.  A St. 2.  Mechanical prosthesis was present in the aortic position with mild AR.  There was mild to moderate MR and mild to moderate TR.  There was moderate LA dilatation.  She had moderate palmar hypertension with a PA pressure at 52 mm.  She also was experiencing ischemic pain to her left foot.  Lower extremity arterial Doppler studies were performed.  She has continued to be on Coumadin for anticoagulation with her mechanical aortic valve.  Apparently, the family had received a phone call today that she was put on a scheduled to undergo left BKA surgery in 5 days on 04/22/2016 for her dry gangrene.  She presents for evaluation.  Past Medical History:  Diagnosis Date  . Anemia    takes Folic Acid daily  . Anxiety   . Aortic stenosis   . Arthritis    "left hand, back" (08/30/2012)  . Asthma   . CAD (coronary artery disease)   . CAD (coronary artery disease) Jan. 2015   Cath: 20% LAD, 50% D1; s/p LIMA-LAD  . Chest pain 03/06/2015  . Chronic back  pain   . Chronic bronchitis ([place])    "q yr w/season changes" (08/30/2012)  . Chronic constipation    takes MIralax and Colace daily  . COPD (chronic obstructive pulmonary disease) ([place])   . Depression    takes Cymbalta for "severe" depression  . End stage renal disease on dialysis [facility]) 02/27/2011   Kidneys shut down at time of liver transplant in Sept 2011 at [facility] in [place], she has been on HD ever since.  Dialyzes at [facility] HD on TTS schedule.  Had L forearm graft used 10 months then removed Dec 2012 due to suspected infection.  A right upper arm AVG was placed Dec 2012 but she developed steal symptoms acutely and it was ligated the same day.  Never had an AV fistula due to small veins.  Now has L thigh AVG put in Jan 2013, has not clotted to date.    [name] GERD (gastroesophageal reflux disease)    takes Omeprazole daily  .  Headache    "at least monthly" (08/30/2012)  . Hepatitis C   . History of blood transfusion    "several" (08/30/2012)  . Hypertension    takes Metoprolol and Lisinopril daily, sees Dr Bea Graff  . Hypothyroidism    takes Synthroid daily  . Migraine    "last migraine was in 2013" (08/30/2012)  . Neuromuscular disorder ([place])    carpal tunnel in right hand  . Obesity   . Peripheral vascular disease (HCC) hands and legs  . Pneumonia    "today and several times before" (08/30/2012)  . 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 SVT (supraventricular tachycardia) ([place]) 06/09/14  . Tobacco abuse   . Type II diabetes mellitus (HCC)    Levemir 2units daily if > 150    Past Surgical History:  Procedure Laterality Date  . AORTIC VALVE REPLACEMENT N/A 03/15/2013   AVR; Surgeon: Ivin Poot, MD;  Location: [facility] OR; Open Heart Surgery;  25m[employer] mechanical prosthesis, top hat valve  . ARTERIOVENOUS GRAFT PLACEMENT Left 10/03/10     forearm  . AV FISTULA PLACEMENT  01/29/2011   Procedure: INSERTION OF ARTERIOVENOUS (AV) GORE-TEX GRAFT ARM;  Surgeon: CElam Dutch MD;  [facility]  Service: Vascular;  Laterality: Right;  . AV FISTULA PLACEMENT  03/10/2011   Procedure: INSERTION OF ARTERIOVENOUS (AV) GORE-TEX GRAFT THIGH;  Surgeon: CElam Dutch MD;  [facility]  Service: Vascular;  Laterality: Left;  . [place]OVAL  12/23/2010   Procedure: REMOVAL OF ARTERIOVENOUS GORETEX GRAFT ([place];  Surgeon: CElam Dutch MD;  [place] [facility]  Service: Vascular;  Laterality: Left;  procedure started @1736 -1852  . CHOLECYSTECTOMY  1993  . CORONARY ARTERY BYPASS GRAFT N/A 03/15/2013   Procedure: CORONARY ARTERY BYPASS GRAFTING (CABG) times one using left internal mammary artery.;  Surgeon: PIvin Poot MD;  [place] [facility]  Service: Open Heart Surgery;  Laterality: N/A;  POSS CABG X 1  . CYSTOSCOPY  1990's  . INSERTION OF DIALYSIS CATHETER  12/23/2010   Procedure: INSERTION OF DIALYSIS CATHETER;  Surgeon: CElam Dutch MD;  [place]  Service: Vascular;  Laterality: Right;  Right Internal Jugular 28cm dialysis catheter insertion procedure time 1701-1720   . INSERTION OF DIALYSIS CATHETER Right 03/11/2016   Procedure: INSERTION OF DIALYSIS CATHETER;  Surgeon: CAngelia Mould MD;  Location: [facility]  Service: Vascular;  Laterality: Right;  . INTRAOPERATIVE TRANSESOPHAGEAL ECHOCARDIOGRAM N/A 03/15/2013   Procedure: INTRAOPERATIVE TRANSESOPHAGEAL ECHOCARDIOGRAM;  Surgeon: PIvin Poot MD;  [place]  Service: Open Heart Surgery;  Laterality: N/A;  . LEFT HEART CATHETERIZATION WITH CORONARY ANGIOGRAM N/A 07/29/2012   Procedure: LEFT HEART CATHETERIZATION WITH CORONARY ANGIOGRAM;  Surgeon: TTroy Sine MD;  [facility];  Service: Cardiovascular;  Laterality: N/A;  . LEFT HEART CATHETERIZATION WITH CORONARY ANGIOGRAM N/A 03/10/2013   Procedure: LEFT HEART CATHETERIZATION WITH CORONARY  ANGIOGRAM;  Surgeon: TTroy Sine MD;  [facility];  Service: Cardiovascular;  Laterality: N/A;  . LEFT HEART CATHETERIZATION WITH CORONARY/GRAFT ANGIOGRAM N/A 12/24/2011   Procedure: LEFT HEART CATHETERIZATION WITH CBeatrix Fetters  Surgeon: JLorretta Harp MD;  [facility];  Service: Cardiovascular;  Laterality: N/A;  . LEFT HEART CATHETERIZATION WITH CORONARY/GRAFT ANGIOGRAM N/A 12/16/2013   Procedure: LEFT HEART  CATHETERIZATION WITH Beatrix Fetters;  Surgeon: Troy Sine, MD;  Location: [facility] CATH LAB;  Service: Cardiovascular;  Laterality: N/A;  . LIGATION ARTERIOVENOUS GORTEX GRAFT Left 03/11/2016   Procedure: LIGATION THIGH ARTERIOVENOUS GORTEX GRAFT;  Surgeon: Angelia Mould, MD;  Location: [place];  Service: Vascular;  Laterality: Left;  . LIVER TRANSPLANT  10/25/2009   sees Dr Ferol Luz 1 every 6 months, saw last in Dec 2013. [name] Coord [phone]  . PERIPHERAL VASCULAR CATHETERIZATION N/A 11/06/2015   Procedure: Abdominal Aortogram;  Surgeon: Serafina Mitchell, MD;  Location: [place] CV LAB;  Service: Cardiovascular;  Laterality: N/A;  . PERIPHERAL VASCULAR CATHETERIZATION N/A 11/06/2015   Procedure: Lower Extremity Angiography;  Surgeon: Serafina Mitchell, MD;  Location: [place] CV LAB;  Service: Cardiovascular;  Laterality: N/A;  . PERIPHERAL VASCULAR CATHETERIZATION  11/06/2015   Procedure: Peripheral Vascular Atherectomy;  Surgeon: Serafina Mitchell, MD;  Location: [place] CV LAB;  Service: Cardiovascular;;  Left Superficial femoral  . SHUNTOGRAM Left 05/15/2014   Procedure: SHUNTOGRAM;  Surgeon: Conrad Ironton, MD;  [facility];  Service: Cardiovascular;  Laterality: Left;  . SMALL INTESTINE SURGERY  90's  . SPINAL GROWTH RODS  2010   "put 2 metal rods in my back; they had detetriorated" (08/30/2012)  . THROMBECTOMY    . THROMBECTOMY AND REVISION OF ARTERIOVENTOUS (AV) GORETEX  GRAFT Left 03/30/2014  . THROMBECTOMY  AND REVISION OF ARTERIOVENTOUS (AV) GORETEX  GRAFT Left 03/30/2014   Procedure: THROMBECTOMY AND REVISION OF ARTERIOVENTOUS (AV) GORETEX  GRAFT;  Surgeon: Conrad , MD;  Location: [place];  Service: Vascular;  Laterality: Left;  . TUBAL LIGATION  1990's    Allergies  Allergen Reactions  . Acetaminophen Other (See Comments)    Liver transplant recipient   . Codeine Itching  . Mirtazapine Other (See Comments)    hallucination  . Morphine Itching and Other (See Comments)    hallucinate    Current Outpatient Prescriptions  Medication Sig Dispense Refill  . aspirin EC 81 MG tablet Take 81 mg by mouth daily.    . calcium acetate (PHOSLO) 667 MG capsule Take 1 capsule (667 mg total) by mouth 3 (three) times daily with meals. 30 capsule 1  . gabapentin (NEURONTIN) 100 MG capsule Take 100 mg by mouth as directed. 1 capsule on dialysis days    . gabapentin (NEURONTIN) 300 MG capsule Take 300 mg by mouth every evening.    [name] HYDROmorphone (DILAUDID) 2 MG tablet Take 2 mg by mouth.    . Insulin Detemir (LEVEMIR FLEXPEN) 100 UNIT/ML Pen Inject 2-6 Units into the skin See admin instructions. Only if cbg is over 200    . levothyroxine (SYNTHROID, LEVOTHROID) 175 MCG tablet TAKE 1 TABLET (175 MCG TOTAL) BY MOUTH DAILY BEFORE BREAKFAST.  6  . metoprolol tartrate (LOPRESSOR) 25 MG tablet Take 25 mg by mouth 2 (two) times daily.    . nitroGLYCERIN (NITROSTAT) 0.4 MG SL tablet Place 1 tablet (0.4 mg total) under the tongue every 5 (five) minutes as needed for chest pain. 30 tablet 0  . ondansetron (ZOFRAN ODT) 4 MG disintegrating tablet Take 1 tablet (4 mg total) by mouth every 8 (eight) hours as needed for nausea. 6 tablet 0  . senna-docusate (SENOKOT-S) 8.6-50 MG tablet Take 1 tablet by mouth daily.    . tacrolimus (PROGRAF) 1 MG capsule Take 2 capsules (2 mg total) by mouth 2 (two) times daily. Take 2 capsules twice daily. [place]  capsule 0  . warfarin (COUMADIN) 2 MG tablet Take 1- 2 tablets by mouth daily  or as directed by coumadin clinic (Patient taking differently: Take 2 mg by mouth daily. Take as directed by coumadin clinic) 45 tablet 3   No current facility-administered medications for this visit.     Social History   Social History  . Marital status: Divorced    Spouse name: N/A  . Number of children: N/A  . Years of education: N/A   Occupational History  . Not on file.   Social History Main Topics  . Smoking status: Current Every Day Smoker    Packs/day: 0.50    Years: 40.00    Types: Cigarettes  . Smokeless tobacco: Never Used     Comment: Almost 1 pk per day  . Alcohol use No  . Drug use: No  . Sexual activity: No   Other Topics Concern  . Not on file   Social History Narrative  . No narrative on file    Family History  Problem Relation Age of Onset  . Cancer Mother   . Diabetes Mother   . Hypertension Mother   . Stroke Mother   . Cancer Father   . Anesthesia problems Neg Hx   . Hypotension Neg Hx   . Malignant hyperthermia Neg Hx   . Pseudochol deficiency Neg Hx     ROS General: Negative; No fevers, chills, or night sweats HEENT: Negative; No changes in vision or hearing, sinus congestion, difficulty swallowing Pulmonary: Negative; No cough, wheezing, shortness of breath, hemoptysis Cardiovascular: See history of present illness GI: Negative; No nausea, vomiting, diarrhea, or abdominal pain GU: Status post biliary stenting with anastomotic stricture and recent acute liver transplant rejection treated with steroids Musculoskeletal: Negative; no myalgias, joint pain, or weakness Hematologic: Negative; no easy bruising, bleeding Endocrine: Positive for hypothyroidism; no heat/cold intolerance; no diabetes, Neuro: Perform neuropathy; no changes in balance, headaches Skin: Negative; No rashes or skin lesions Psychiatric: Negative; No behavioral problems, depression Sleep: positive for witnessed apnea No snoring,  daytime sleepiness, hypersomnolence,  bruxism, restless legs, hypnogognic hallucinations. Other comprehensive 14 point system review is negative  Physical Exam BP 130/68   Pulse 74   Ht 5' 4.5" (1.638 m)   Wt 129 lb 6.4 oz (58.7 kg)   BMI 21.87 kg/m    Wt Readings from Last 3 Encounters:  04/17/16 129 lb 6.4 oz (58.7 kg)  04/10/16 128 lb (58.1 kg)  03/19/16 132 lb 8 oz (60.1 kg)   General: Alert, oriented, no distress.  Skin: normal turgor, no rashes, warm and dry HEENT: Normocephalic, atraumatic. Pupils equal round and reactive to light; sclera anicteric; extraocular muscles intact, No lid lag; Nose without nasal septal hypertrophy; Mouth/Parynx benign; Mallinpatti scale 3 Neck: No JVD, no carotid bruits; normal carotid upstroke Lungs: clear to ausculatation and percussion bilaterally; no wheezing or rales, normal inspiratory and expiratory effort Chest wall: without tenderness to palpitation Heart: PMI not displaced, RRR, s1 s2 normal, 2/6 systolic murmur, crisp mechanical heart sounds; No diastolic murmur, no rubs, gallops, thrills, or heaves Abdomen: soft, nontender; no hepatosplenomehaly, BS+; abdominal aorta nontender and not dilated by palpation. 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  QRS complex V1 V2.  Mild ST-T changes laterally.  December 2017 ECG (independently read by me): Sinus rhythm with PAC.  LVH by voltage.  Diffuse T-wave abnormality, inferior and laterally.  QTc interval 452 ms.  May 2017 ECG (independently read by me): Normal sinus rhythm.  QRS complex V1 V2.  Nonspecific T-wave abnormality.  June 2016 ECG (independently read by me): Normal sinus rhythm at 81 bpm.  Probable left atrial enlargement.  QS complex anteriorly.  Increased QTc  interval.  Inferolateral T changes.  06/19/2014 ECG (independently read by me): Normal sinus rhythm at 77 bpm.  Nondiagnostic T changes in leads 1 aVL and V6.  March 2016 ECG (independently read by me): Normal sinus rhythm at 69 bpm.  Borderline first degree AV block with a PR interval 204 ms.  Inferolateral T wave abnormality.  November 2015 ECG (independently read by me): Normal sinus rhythm.  Borderline first-degree AV block with a PR interval of 202.  T abnormalities in leads 1 and L2 and V6.  LVH by voltage.  May 2015 ECG (independently read by me): Normal sinus rhythm at 72 beats per minute.  T wave changes in leads 1 and L. and V6.  QTC 486 ms.  LABS:  BMP Latest Ref Rng & Units 03/19/2016 03/17/2016 03/14/2016  Glucose 65 - 99 mg/dL 90 108(H) 60(L)  BUN 6 - 20 mg/dL 31(H) 38(H) 27(H)  Creatinine 0.44 - 1.00 mg/dL 6.14(H) 7.02(H) 5.16(H)  Sodium 135 - 145 mmol/L 133(L) 130(L) 132(L)  Potassium 3.5 - 5.1 mmol/L 3.3(L) 4.6 4.0  Chloride 101 - 111 mmol/L 95(L) 97(L) 96(L)  CO2 22 - 32 mmol/L 27 23 27   Calcium 8.9 - 10.3 mg/dL 8.3(L) 8.5(L) 8.8(L)    Hepatic Function Latest Ref Rng & Units 03/19/2016 03/17/2016 03/14/2016  Total Protein 6.5 - 8.1 g/dL - - -  Albumin 3.5 - 5.0 g/dL 1.6(L) 1.7(L) 1.7(L)  AST 15 - 41 U/L - - -  ALT 14 - 54 U/L - - -  Alk Phosphatase 38 - 126 U/L - - -  Total Bilirubin 0.3 - 1.2 mg/dL - - -  Bilirubin, Direct 0.1 - 0.5 mg/dL - - -    CBC Latest Ref Rng & Units 03/20/2016 03/19/2016 03/17/2016  WBC 4.0 - 10.5 K/uL 15.6(H) 22.6(H) 28.7(H)  Hemoglobin 12.0 - 15.0 g/dL 8.1(L) 7.5(L) 8.4(L)  Hematocrit 36.0 - 46.0 % 24.6(L) 23.4(L) 25.6(L)  Platelets 150 - 400 K/uL 287 294 278   Lab Results  Component Value Date   MCV 98.8 03/20/2016   MCV 97.9 03/19/2016   MCV 98.1 03/17/2016     BNP    Component Value Date/Time   PROBNP 33,098.0 (H) 01/02/2014 2318    Lipid Panel     Component Value Date/Time   CHOL 225 (H) 12/22/2011 1030   TRIG 121 12/22/2011  1030   HDL 97 12/22/2011 1030   CHOLHDL 2.3 12/22/2011 1030   VLDL 24 12/22/2011 1030   LDLCALC 104 (H) 12/22/2011 1030   INR today: 3.0  RADIOLOGY: Dg Chest 2 View  06/01/2013   CLINICAL DATA:  History of aortic valve replacement in February, follow-up  EXAM: CHEST  2 VIEW  COMPARISON:  Chest x-ray 04/20/2013, and CT chest of 03/11/2013  FINDINGS: Cardiomegaly is stable. There is very mild pulmonary vascular congestion present. No effusion is seen. A nodular opacity in the medial left lung apex is stable and compared to the prior CT chest represents a bony density from the upper thoracic spine. Median sternotomy sutures remain and an aortic valve replacement  is present. No acute bony abnormality is seen. Multiple surgical clips are present within the epigastrium.  IMPRESSION: 1. Stable cardiomegaly. 2. Mild pulmonary vascular congestion.   Electronically Signed   By: Ivar Drape M.D.   On: 06/01/2013 09:40    I personally reviewed the hospital records from her 2 hospitalizations at [facility] in April and again in May 2017, TEE and transthoracic echo studies, and recent [facility] discharge summary on 12/27/2015. I reviewed her recent hospitalization at [facility] from for over 24 through 03/20/2016.  IMPRESSION:  1. Coronary artery disease involving native coronary artery of native heart without angina pectoris   2. S/P CABG x 1, LIMA-LAD 03/2013   3. S/P AVR   4. Paroxysmal SVT (supraventricular tachycardia) ([place])   5. ESRD on dialysis ([place])   6. Long term current use of anticoagulant therapy   7. Other specified hypothyroidism   8. SVT (supraventricular tachycardia) ([place])   9. Dry gangrene ([place])   10. Preoperative clearance     ASSESSMENT AND PLAN: [name] is a 61 year old African-American female who underwent mechanical aortic valve replacement for moderately severe symptomatic aortic stenosis and single-vessel LIMA to LAD CABG revascularization surgery in February  2015.  She had been readmitted with episodes of tachycardia and shortness of breath.   She  developed recurrent episode of SVT which responded to adenosine.  She was hospitalized at [facility] and due to significant hematoma in her left kidney and underwent 11th rib and left kidney removal.  During the initial hospitalization, she was felt to have a possible moderate perivalvular leak around her prosthetic aortic valve.  On subsequent TEE testing there was trace aortic regurgitation.  Her mean pressure gradient was 15 mm with a peak gradient of 33 concordant with her mechanical prosthesis.  She had developed significant LFT elevation, as well as bilirubin, and had undergone liver biopsy.  She ultimately was hospitalized at [facility] with presumed acute liver transplant rejection for which she was treated with Solu-Medrol with improvement.  Ultimately was found to have a biliary stricture which was dilated and underwent stenting.  She had developed recurrent SVT which which treated with beta blocker therapy.  I reviewed her most recent hospitalization when she was admitted with sepsis.  During that hospitalization.  The plan was for her to undergo a [place] study, but this was never done due to her sepsis.  She denies any recent chest pain but does admit to some shortness of breath with activity.  She is unaware of recurrent SVT episodes.  She continues to be on Coumadin and her INR today is therapeutic at 3.0 which is on the high side for her, particularly with her platelet dysfunction from end-stage renal disease.  Dose adjustment was made.  Reportedly, the family members had received a call today that she is on the schedule for her to undergo elective left BKA surgery in 5 days.  I personally spoken with the vascular surgery office and have recommended that this surgery be deferred for at least 2-3 weeks.  I will try to schedule the patient for her [place] study to be done ASAP when she is not  undergoing dialysis.  In addition, since she has a [employer] mechanical valve, she will require bridging once her Coumadin is held to reduce the likelihood of prosthetic valve thrombosis.  Pending the results of the above studies, she will will be given preoperative clearance for her BKA procedure. Time spent: 40 minutes  Troy Sine, MD, [facility]  04/19/2016 12:44 PM

## 2016-04-17 NOTE — Patient Instructions (Signed)
Your physician has requested that you have a lexiscan myoview. For further information please visit [email or web address]. Please follow instruction sheet, as given.  THIS WILL BE DONE STAT NEXT Tuesday

## 2016-04-18 ENCOUNTER — Telehealth (HOSPITAL_COMMUNITY): Payer: Self-pay

## 2016-04-18 DIAGNOSIS — N186 End stage renal disease: Secondary | ICD-10-CM | POA: Diagnosis not present

## 2016-04-18 DIAGNOSIS — N2581 Secondary hyperparathyroidism of renal origin: Secondary | ICD-10-CM | POA: Diagnosis not present

## 2016-04-18 NOTE — Telephone Encounter (Signed)
Encounter complete. 

## 2016-04-21 ENCOUNTER — Inpatient Hospital Stay: Admission: RE | Admit: 2016-04-21 | Payer: Medicare Other | Source: Home / Self Care | Admitting: Vascular Surgery

## 2016-04-21 ENCOUNTER — Inpatient Hospital Stay (HOSPITAL_COMMUNITY)
Admission: AD | Admit: 2016-04-21 | Discharge: 2016-04-30 | DRG: 239 | Disposition: A | Discharge status: Rehabilitation Facility | Payer: Medicare Other | Source: Other Acute Inpatient Hospital | Attending: Internal Medicine | Admitting: Internal Medicine

## 2016-04-21 ENCOUNTER — Encounter (HOSPITAL_COMMUNITY): Payer: Self-pay | Admitting: Family Medicine

## 2016-04-21 ENCOUNTER — Telehealth: Payer: Self-pay | Admitting: Cardiovascular Disease

## 2016-04-21 DIAGNOSIS — Z992 Dependence on renal dialysis: Secondary | ICD-10-CM | POA: Diagnosis not present

## 2016-04-21 DIAGNOSIS — I70229 Atherosclerosis of native arteries of extremities with rest pain, unspecified extremity: Secondary | ICD-10-CM | POA: Diagnosis present

## 2016-04-21 DIAGNOSIS — M79671 Pain in right foot: Secondary | ICD-10-CM

## 2016-04-21 DIAGNOSIS — I472 Ventricular tachycardia: Secondary | ICD-10-CM | POA: Diagnosis not present

## 2016-04-21 DIAGNOSIS — J449 Chronic obstructive pulmonary disease, unspecified: Secondary | ICD-10-CM | POA: Diagnosis present

## 2016-04-21 DIAGNOSIS — N2581 Secondary hyperparathyroidism of renal origin: Secondary | ICD-10-CM | POA: Diagnosis not present

## 2016-04-21 DIAGNOSIS — E118 Type 2 diabetes mellitus with unspecified complications: Secondary | ICD-10-CM | POA: Diagnosis not present

## 2016-04-21 DIAGNOSIS — E7800 Pure hypercholesterolemia, unspecified: Secondary | ICD-10-CM | POA: Diagnosis not present

## 2016-04-21 DIAGNOSIS — G8929 Other chronic pain: Secondary | ICD-10-CM | POA: Diagnosis not present

## 2016-04-21 DIAGNOSIS — D696 Thrombocytopenia, unspecified: Secondary | ICD-10-CM | POA: Diagnosis not present

## 2016-04-21 DIAGNOSIS — I10 Essential (primary) hypertension: Secondary | ICD-10-CM

## 2016-04-21 DIAGNOSIS — I2510 Atherosclerotic heart disease of native coronary artery without angina pectoris: Secondary | ICD-10-CM | POA: Diagnosis not present

## 2016-04-21 DIAGNOSIS — E1122 Type 2 diabetes mellitus with diabetic chronic kidney disease: Secondary | ICD-10-CM | POA: Diagnosis present

## 2016-04-21 DIAGNOSIS — I2583 Coronary atherosclerosis due to lipid rich plaque: Secondary | ICD-10-CM

## 2016-04-21 DIAGNOSIS — I739 Peripheral vascular disease, unspecified: Secondary | ICD-10-CM | POA: Diagnosis not present

## 2016-04-21 DIAGNOSIS — Z8669 Personal history of other diseases of the nervous system and sense organs: Secondary | ICD-10-CM

## 2016-04-21 DIAGNOSIS — I998 Other disorder of circulatory system: Secondary | ICD-10-CM | POA: Diagnosis present

## 2016-04-21 DIAGNOSIS — L97428 Non-pressure chronic ulcer of left heel and midfoot with other specified severity: Secondary | ICD-10-CM | POA: Diagnosis present

## 2016-04-21 DIAGNOSIS — Z79899 Other long term (current) drug therapy: Secondary | ICD-10-CM

## 2016-04-21 DIAGNOSIS — Z79891 Long term (current) use of opiate analgesic: Secondary | ICD-10-CM

## 2016-04-21 DIAGNOSIS — S92512A Displaced fracture of proximal phalanx of left lesser toe(s), initial encounter for closed fracture: Secondary | ICD-10-CM | POA: Diagnosis not present

## 2016-04-21 DIAGNOSIS — Z952 Presence of prosthetic heart valve: Secondary | ICD-10-CM | POA: Diagnosis not present

## 2016-04-21 DIAGNOSIS — E039 Hypothyroidism, unspecified: Secondary | ICD-10-CM | POA: Diagnosis present

## 2016-04-21 DIAGNOSIS — E11621 Type 2 diabetes mellitus with foot ulcer: Secondary | ICD-10-CM | POA: Diagnosis present

## 2016-04-21 DIAGNOSIS — Z66 Do not resuscitate: Secondary | ICD-10-CM | POA: Diagnosis present

## 2016-04-21 DIAGNOSIS — Z905 Acquired absence of kidney: Secondary | ICD-10-CM | POA: Diagnosis not present

## 2016-04-21 DIAGNOSIS — R52 Pain, unspecified: Secondary | ICD-10-CM | POA: Diagnosis not present

## 2016-04-21 DIAGNOSIS — K219 Gastro-esophageal reflux disease without esophagitis: Secondary | ICD-10-CM | POA: Diagnosis not present

## 2016-04-21 DIAGNOSIS — Z794 Long term (current) use of insulin: Secondary | ICD-10-CM

## 2016-04-21 DIAGNOSIS — M79672 Pain in left foot: Secondary | ICD-10-CM | POA: Diagnosis not present

## 2016-04-21 DIAGNOSIS — I132 Hypertensive heart and chronic kidney disease with heart failure and with stage 5 chronic kidney disease, or end stage renal disease: Secondary | ICD-10-CM | POA: Diagnosis not present

## 2016-04-21 DIAGNOSIS — Z823 Family history of stroke: Secondary | ICD-10-CM

## 2016-04-21 DIAGNOSIS — D72829 Elevated white blood cell count, unspecified: Secondary | ICD-10-CM

## 2016-04-21 DIAGNOSIS — M898X9 Other specified disorders of bone, unspecified site: Secondary | ICD-10-CM | POA: Diagnosis present

## 2016-04-21 DIAGNOSIS — I120 Hypertensive chronic kidney disease with stage 5 chronic kidney disease or end stage renal disease: Secondary | ICD-10-CM | POA: Diagnosis not present

## 2016-04-21 DIAGNOSIS — M79673 Pain in unspecified foot: Secondary | ICD-10-CM | POA: Diagnosis present

## 2016-04-21 DIAGNOSIS — N189 Chronic kidney disease, unspecified: Secondary | ICD-10-CM

## 2016-04-21 DIAGNOSIS — E1152 Type 2 diabetes mellitus with diabetic peripheral angiopathy with gangrene: Secondary | ICD-10-CM | POA: Diagnosis not present

## 2016-04-21 DIAGNOSIS — Z8249 Family history of ischemic heart disease and other diseases of the circulatory system: Secondary | ICD-10-CM

## 2016-04-21 DIAGNOSIS — Z944 Liver transplant status: Secondary | ICD-10-CM | POA: Diagnosis not present

## 2016-04-21 DIAGNOSIS — Z833 Family history of diabetes mellitus: Secondary | ICD-10-CM

## 2016-04-21 DIAGNOSIS — S92502A Displaced unspecified fracture of left lesser toe(s), initial encounter for closed fracture: Secondary | ICD-10-CM | POA: Diagnosis not present

## 2016-04-21 DIAGNOSIS — Z7982 Long term (current) use of aspirin: Secondary | ICD-10-CM

## 2016-04-21 DIAGNOSIS — I252 Old myocardial infarction: Secondary | ICD-10-CM | POA: Diagnosis not present

## 2016-04-21 DIAGNOSIS — D631 Anemia in chronic kidney disease: Secondary | ICD-10-CM | POA: Diagnosis present

## 2016-04-21 DIAGNOSIS — I429 Cardiomyopathy, unspecified: Secondary | ICD-10-CM | POA: Diagnosis not present

## 2016-04-21 DIAGNOSIS — N186 End stage renal disease: Secondary | ICD-10-CM | POA: Diagnosis present

## 2016-04-21 DIAGNOSIS — Z951 Presence of aortocoronary bypass graft: Secondary | ICD-10-CM | POA: Diagnosis not present

## 2016-04-21 DIAGNOSIS — R269 Unspecified abnormalities of gait and mobility: Secondary | ICD-10-CM

## 2016-04-21 DIAGNOSIS — L97429 Non-pressure chronic ulcer of left heel and midfoot with unspecified severity: Secondary | ICD-10-CM | POA: Diagnosis not present

## 2016-04-21 DIAGNOSIS — I5042 Chronic combined systolic (congestive) and diastolic (congestive) heart failure: Secondary | ICD-10-CM | POA: Diagnosis not present

## 2016-04-21 DIAGNOSIS — F17210 Nicotine dependence, cigarettes, uncomplicated: Secondary | ICD-10-CM | POA: Diagnosis not present

## 2016-04-21 DIAGNOSIS — Z7901 Long term (current) use of anticoagulants: Secondary | ICD-10-CM

## 2016-04-21 DIAGNOSIS — I25810 Atherosclerosis of coronary artery bypass graft(s) without angina pectoris: Secondary | ICD-10-CM

## 2016-04-21 DIAGNOSIS — I96 Gangrene, not elsewhere classified: Secondary | ICD-10-CM | POA: Diagnosis not present

## 2016-04-21 DIAGNOSIS — F17200 Nicotine dependence, unspecified, uncomplicated: Secondary | ICD-10-CM | POA: Diagnosis not present

## 2016-04-21 DIAGNOSIS — R079 Chest pain, unspecified: Secondary | ICD-10-CM

## 2016-04-21 LAB — COMPREHENSIVE METABOLIC PANEL
ALK PHOS: 226 U/L — AB (ref 38–126)
ALT: 14 U/L (ref 14–54)
AST: 60 U/L — AB (ref 15–41)
Albumin: 2.3 g/dL — ABNORMAL LOW (ref 3.5–5.0)
Anion gap: 11 (ref 5–15)
BUN: 5 mg/dL — AB (ref 6–20)
CALCIUM: 8.3 mg/dL — AB (ref 8.9–10.3)
CHLORIDE: 97 mmol/L — AB (ref 101–111)
CO2: 29 mmol/L (ref 22–32)
CREATININE: 3.51 mg/dL — AB (ref 0.44–1.00)
GFR calc Af Amer: 15 mL/min — ABNORMAL LOW (ref >60)
GFR calc non Af Amer: 13 mL/min — ABNORMAL LOW (ref >60)
Glucose, Bld: 99 mg/dL (ref 65–99)
Potassium: 3.5 mmol/L (ref 3.5–5.1)
SODIUM: 137 mmol/L (ref 135–145)
Total Bilirubin: 1.5 mg/dL — ABNORMAL HIGH (ref 0.3–1.2)
Total Protein: 7.5 g/dL (ref 6.5–8.1)

## 2016-04-21 LAB — CBC WITH DIFFERENTIAL/PLATELET
Basophils Absolute: 0 10*3/uL (ref 0.0–0.1)
Basophils Relative: 0 %
EOS PCT: 2 %
Eosinophils Absolute: 0.2 10*3/uL (ref 0.0–0.7)
HCT: 41.4 % (ref 36.0–46.0)
Hemoglobin: 13.8 g/dL (ref 12.0–15.0)
Lymphocytes Relative: 38 %
Lymphs Abs: 3.3 10*3/uL (ref 0.7–4.0)
MCH: 29.6 pg (ref 26.0–34.0)
MCHC: 33.3 g/dL (ref 30.0–36.0)
MCV: 88.7 fL (ref 78.0–100.0)
MONO ABS: 1.6 10*3/uL — AB (ref 0.1–1.0)
MONOS PCT: 18 %
Neutro Abs: 3.6 10*3/uL (ref 1.7–7.7)
Neutrophils Relative %: 42 %
PLATELETS: 133 10*3/uL — AB (ref 150–400)
RBC: 4.67 MIL/uL (ref 3.87–5.11)
RDW: 16.1 % — AB (ref 11.5–15.5)
WBC: 8.7 10*3/uL (ref 4.0–10.5)

## 2016-04-21 LAB — PROTIME-INR
INR: 2.76
Prothrombin Time: 29.7 seconds — ABNORMAL HIGH (ref 11.4–15.2)

## 2016-04-21 LAB — GLUCOSE, CAPILLARY: Glucose-Capillary: 88 mg/dL (ref 65–99)

## 2016-04-21 MED ORDER — ONDANSETRON HCL 4 MG/2ML IJ SOLN
4.0000 mg | Freq: Four times a day (QID) | INTRAMUSCULAR | Status: DC | PRN
  Administered 2016-04-21 – 2016-04-24 (×2): 4 mg via INTRAVENOUS
  Filled 2016-04-21 (×2): qty 2

## 2016-04-21 MED ORDER — TACROLIMUS 1 MG PO CAPS
2.0000 mg | ORAL_CAPSULE | Freq: Two times a day (BID) | ORAL | Status: DC
  Administered 2016-04-21 – 2016-04-30 (×18): 2 mg via ORAL
  Filled 2016-04-21 (×20): qty 2

## 2016-04-21 MED ORDER — ASPIRIN EC 81 MG PO TBEC
81.0000 mg | DELAYED_RELEASE_TABLET | Freq: Every day | ORAL | Status: DC
  Administered 2016-04-22 – 2016-04-30 (×9): 81 mg via ORAL
  Filled 2016-04-21 (×8): qty 1

## 2016-04-21 MED ORDER — METOPROLOL TARTRATE 25 MG PO TABS
25.0000 mg | ORAL_TABLET | Freq: Two times a day (BID) | ORAL | Status: DC
  Administered 2016-04-21 – 2016-04-30 (×18): 25 mg via ORAL
  Filled 2016-04-21 (×18): qty 1

## 2016-04-21 MED ORDER — INSULIN ASPART 100 UNIT/ML ~~LOC~~ SOLN: 0.0000 [IU] | Freq: Every day | SUBCUTANEOUS | Status: DC

## 2016-04-21 MED ORDER — HYDROMORPHONE HCL 1 MG/ML IJ SOLN
1.0000 mg | INTRAMUSCULAR | Status: DC | PRN
  Administered 2016-04-21: 1 mg via INTRAVENOUS
  Filled 2016-04-21: qty 1

## 2016-04-21 MED ORDER — INSULIN ASPART 100 UNIT/ML ~~LOC~~ SOLN
0.0000 [IU] | Freq: Three times a day (TID) | SUBCUTANEOUS | Status: DC
  Administered 2016-04-26 (×2): 2 [IU] via SUBCUTANEOUS
  Administered 2016-04-27: 1 [IU] via SUBCUTANEOUS
  Administered 2016-04-27: 2 [IU] via SUBCUTANEOUS
  Administered 2016-04-27 – 2016-04-29 (×2): 1 [IU] via SUBCUTANEOUS
  Administered 2016-04-29: 2 [IU] via SUBCUTANEOUS

## 2016-04-21 MED ORDER — SENNOSIDES-DOCUSATE SODIUM 8.6-50 MG PO TABS
1.0000 | ORAL_TABLET | Freq: Every day | ORAL | Status: DC
  Administered 2016-04-22: 1 via ORAL
  Filled 2016-04-21: qty 1

## 2016-04-21 MED ORDER — CALCIUM ACETATE (PHOS BINDER) 667 MG PO CAPS
667.0000 mg | ORAL_CAPSULE | Freq: Three times a day (TID) | ORAL | Status: DC
  Administered 2016-04-22 – 2016-04-30 (×20): 667 mg via ORAL
  Filled 2016-04-21 (×19): qty 1

## 2016-04-21 MED ORDER — ONDANSETRON HCL 4 MG PO TABS: 4.0000 mg | ORAL_TABLET | Freq: Four times a day (QID) | ORAL | Status: DC | PRN

## 2016-04-21 MED ORDER — GABAPENTIN 100 MG PO CAPS
100.0000 mg | ORAL_CAPSULE | ORAL | Status: DC
  Administered 2016-04-23 – 2016-04-28 (×2): 100 mg via ORAL
  Filled 2016-04-21 (×2): qty 1

## 2016-04-21 MED ORDER — GABAPENTIN 300 MG PO CAPS
300.0000 mg | ORAL_CAPSULE | Freq: Every day | ORAL | Status: DC
  Administered 2016-04-21 – 2016-04-29 (×9): 300 mg via ORAL
  Filled 2016-04-21 (×10): qty 1

## 2016-04-21 MED ORDER — LEVOTHYROXINE SODIUM 50 MCG PO TABS
175.0000 ug | ORAL_TABLET | Freq: Every day | ORAL | Status: DC
  Administered 2016-04-22 – 2016-04-30 (×9): 175 ug via ORAL
  Filled 2016-04-21 (×10): qty 1

## 2016-04-21 MED ORDER — HYDROMORPHONE HCL 1 MG/ML IJ SOLN
1.0000 mg | INTRAMUSCULAR | Status: DC | PRN
  Administered 2016-04-21: 1 mg via INTRAVENOUS
  Filled 2016-04-21 (×2): qty 1

## 2016-04-21 NOTE — Telephone Encounter (Signed)
Spoke with Sharene Skeans (child) she states that she is transporting pt to hospital, she states that pt is unresponsive at times. She states that pt has stress test scheduled for tomorrow, she would like to cancel it due to transport to Er.

## 2016-04-21 NOTE — H&P (Signed)
History and Physical  Patient Name: [name]     WCH:[id number]    DOB: 10-Dec-1955    DOA: 04/21/2016 PCP: No primary care provider on file.   Patient coming from: [facility]  Chief Complaint: Foot pain  HPI: [name] is a 61 y.o. female with a past medical history significant for ESRD on HD MWF, s/p Liver transplant for hep C followed at [facility], AS s/p AVR and CABG in 2015 on warfarin who presents with foot pain.  The patient has had worsening of her chronic left PVD and left heel ulcer to the point that Dr. Scot Dock evidently is planning left BKA soon (per schedule, Mar 20th).  She was evaluated by Cardiology for pre-op clearance last week, Myoview is pending.  She has been taking Vicodin for worsening pain, but in the last 2 days, she can't stand on the foot because of "unbearable pain" in her left heel and forefoot.  The pain is constant, excruciating and not relieved with oral opiates, so she came to the [place] ER after dialysis today.  ED course: -AFebrile, heart rate 93, respirations 21, BP 210/88, SpO2 96% -The case was discussed with TRH who accepted the patient for pain control      ROS: Review of Systems  Constitutional: Positive for chills. Negative for diaphoresis, fever and malaise/fatigue.  Respiratory: Negative for cough, sputum production and shortness of breath.   Cardiovascular: Positive for claudication. Negative for chest pain, palpitations, leg swelling and PND.  Gastrointestinal: Positive for nausea and vomiting.  Neurological: Negative for weakness.  All other systems reviewed and are negative.         Past Medical History:  Diagnosis Date  . Anemia    takes Folic Acid daily  . Anxiety   . Aortic stenosis   . Arthritis    "left hand, back" (08/30/2012)  . Asthma   . CAD (coronary artery disease)   . CAD (coronary artery disease) Jan. 2015   Cath: 20% LAD, 50% D1; s/p LIMA-LAD  . Chest pain 03/06/2015  . Chronic back pain   .  Chronic bronchitis ([place])    "q yr w/season changes" (08/30/2012)  . Chronic constipation    takes MIralax and Colace daily  . COPD (chronic obstructive pulmonary disease) ([place])   . Depression    takes Cymbalta for "severe" depression  . End stage renal disease on dialysis [facility]) 02/27/2011   Kidneys shut down at time of liver transplant in Sept 2011 at [facility] in [place], she has been on HD ever since.  Dialyzes at [facility] HD on TTS schedule.  Had L forearm graft used 10 months then removed Dec 2012 due to suspected infection.  A right upper arm AVG was placed Dec 2012 but she developed steal symptoms acutely and it was ligated the same day.  Never had an AV fistula due to small veins.  Now has L thigh AVG put in Jan 2013, has not clotted to date.    [name] GERD (gastroesophageal reflux disease)    takes Omeprazole daily  . Headache    "at least monthly" (08/30/2012)  . Hepatitis C   . History of blood transfusion    "several" (08/30/2012)  . Hypertension    takes Metoprolol and Lisinopril daily, sees Dr Bea Graff  . Hypothyroidism    takes Synthroid daily  . Migraine    "last migraine was in 2013" (08/30/2012)  . Neuromuscular disorder ([place])    carpal tunnel in right hand  .  Obesity   . Peripheral vascular disease (HCC) hands and legs  . Pneumonia    "today and several times before" (08/30/2012)  . 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 SVT (supraventricular tachycardia) ([place]) 06/09/14  . Tobacco abuse   . Type II diabetes mellitus (HCC)    Levemir 2units daily if > 150    Past Surgical History:  Procedure Laterality Date  . AORTIC VALVE REPLACEMENT N/A 03/15/2013   AVR; Surgeon: Ivin Poot, MD;  Location: [facility] OR; Open Heart Surgery;  22mm[employer] mechanical prosthesis, top hat valve  . ARTERIOVENOUS GRAFT PLACEMENT Left 10/03/10    forearm  . AV  FISTULA PLACEMENT  01/29/2011   Procedure: INSERTION OF ARTERIOVENOUS (AV) GORE-TEX GRAFT ARM;  Surgeon: Elam Dutch, MD;  Location: [facility] OR;  Service: Vascular;  Laterality: Right;  . AV FISTULA PLACEMENT  03/10/2011   Procedure: INSERTION OF ARTERIOVENOUS (AV) GORE-TEX GRAFT THIGH;  Surgeon: Elam Dutch, MD;  Location: [place];  Service: Vascular;  Laterality: Left;  . Justice REMOVAL  12/23/2010   Procedure: REMOVAL OF ARTERIOVENOUS GORETEX GRAFT ([place]);  Surgeon: Elam Dutch, MD;  Location: [place];  Service: Vascular;  Laterality: Left;  procedure started @1736 -1852  . CHOLECYSTECTOMY  1993  . CORONARY ARTERY BYPASS GRAFT N/A 03/15/2013   Procedure: CORONARY ARTERY BYPASS GRAFTING (CABG) times one using left internal mammary artery.;  Surgeon: Ivin Poot, MD;  Location: [place];  Service: Open Heart Surgery;  Laterality: N/A;  POSS CABG X 1  . CYSTOSCOPY  1990's  . INSERTION OF DIALYSIS CATHETER  12/23/2010   Procedure: INSERTION OF DIALYSIS CATHETER;  Surgeon: Elam Dutch, MD;  Location: [place];  Service: Vascular;  Laterality: Right;  Right Internal Jugular 28cm dialysis catheter insertion procedure time 1701-1720   . INSERTION OF DIALYSIS CATHETER Right 03/11/2016   Procedure: INSERTION OF DIALYSIS CATHETER;  Surgeon: Angelia Mould, MD;  Location: [place];  Service: Vascular;  Laterality: Right;  . INTRAOPERATIVE TRANSESOPHAGEAL ECHOCARDIOGRAM N/A 03/15/2013   Procedure: INTRAOPERATIVE TRANSESOPHAGEAL ECHOCARDIOGRAM;  Surgeon: Ivin Poot, MD;  Location: [place];  Service: Open Heart Surgery;  Laterality: N/A;  . LEFT HEART CATHETERIZATION WITH CORONARY ANGIOGRAM N/A 07/29/2012   Procedure: LEFT HEART CATHETERIZATION WITH CORONARY ANGIOGRAM;  Surgeon: Troy Sine, MD;  Location: [facility] CATH LAB;  Service: Cardiovascular;  Laterality: N/A;  . LEFT HEART CATHETERIZATION WITH CORONARY ANGIOGRAM N/A 03/10/2013   Procedure: LEFT HEART CATHETERIZATION WITH CORONARY ANGIOGRAM;   Surgeon: Troy Sine, MD;  Location: [facility] CATH LAB;  Service: Cardiovascular;  Laterality: N/A;  . LEFT HEART CATHETERIZATION WITH CORONARY/GRAFT ANGIOGRAM N/A 12/24/2011   Procedure: LEFT HEART CATHETERIZATION WITH Beatrix Fetters;  Surgeon: [name], MD;  Location: [facility] CATH LAB;  Service: Cardiovascular;  Laterality: N/A;  . LEFT HEART CATHETERIZATION WITH CORONARY/GRAFT ANGIOGRAM N/A 12/16/2013   Procedure: LEFT HEART CATHETERIZATION WITH Beatrix Fetters;  Surgeon: Troy Sine, MD;  Location: [facility] CATH LAB;  Service: Cardiovascular;  Laterality: N/A;  . LIGATION ARTERIOVENOUS GORTEX GRAFT Left 03/11/2016   Procedure: LIGATION THIGH ARTERIOVENOUS GORTEX GRAFT;  Surgeon: Angelia Mould, MD;  Location: [place];  Service: Vascular;  Laterality: Left;  . LIVER TRANSPLANT  10/25/2009   sees Dr Ferol Luz 1 every 6 months, saw last in Dec 2013. Quillian Quince  Katherine Basset [phone]  . PERIPHERAL VASCULAR CATHETERIZATION N/A 11/06/2015   Procedure: Abdominal Aortogram;  Surgeon: Serafina Mitchell, MD;  Location: [place] CV LAB;  Service: Cardiovascular;  Laterality: N/A;  . PERIPHERAL VASCULAR CATHETERIZATION N/A 11/06/2015   Procedure: Lower Extremity Angiography;  Surgeon: Serafina Mitchell, MD;  Location: [place] CV LAB;  Service: Cardiovascular;  Laterality: N/A;  . PERIPHERAL VASCULAR CATHETERIZATION  11/06/2015   Procedure: Peripheral Vascular Atherectomy;  Surgeon: Serafina Mitchell, MD;  Location: [place] CV LAB;  Service: Cardiovascular;;  Left Superficial femoral  . SHUNTOGRAM Left 05/15/2014   Procedure: SHUNTOGRAM;  Surgeon: Conrad Van Buren, MD;  Location: [facility] CATH LAB;  Service: Cardiovascular;  Laterality: Left;  . SMALL INTESTINE SURGERY  90's  . SPINAL GROWTH RODS  2010   "put 2 metal rods in my back; they had detetriorated" (08/30/2012)  . THROMBECTOMY    . THROMBECTOMY AND REVISION OF ARTERIOVENTOUS (AV) GORETEX  GRAFT Left 03/30/2014  . THROMBECTOMY AND REVISION  OF ARTERIOVENTOUS (AV) GORETEX  GRAFT Left 03/30/2014   Procedure: THROMBECTOMY AND REVISION OF ARTERIOVENTOUS (AV) GORETEX  GRAFT;  Surgeon: Conrad [place], MD;  Location: [place];  Service: Vascular;  Laterality: Left;  . TUBAL LIGATION  1990's    Social History: Patient lives with her daughter.  The patient walks with a walker, can walk >100 feet without rest, but at the store uses a motorized cart.  Still smoking 1/2 ppd.    Allergies  Allergen Reactions  . Acetaminophen Other (See Comments)    Liver transplant recipient   . Codeine Itching  . Mirtazapine Other (See Comments)    hallucination  . Morphine Itching and Other (See Comments)    hallucinate    Family history: family history includes Cancer in her father and mother; Diabetes in her mother; Hypertension in her mother; Stroke in her mother.  Prior to Admission medications   Medication Sig Start Date End Date Taking? Authorizing Provider  aspirin EC 81 MG tablet Take 81 mg by mouth daily.   Yes Historical Provider, MD  levothyroxine (SYNTHROID, LEVOTHROID) 175 MCG tablet TAKE 1 TABLET (175 MCG TOTAL) BY MOUTH DAILY BEFORE BREAKFAST. 04/01/16  Yes Historical Provider, MD  metoprolol tartrate (LOPRESSOR) 25 MG tablet Take 25 mg by mouth 2 (two) times daily. 03/16/14  Yes Historical Provider, MD  tacrolimus (PROGRAF) 1 MG capsule Take 2 capsules (2 mg total) by mouth 2 (two) times daily. Take 2 capsules twice daily. 11/29/15  Yes Maryann Mikhail, DO  warfarin (COUMADIN) 2 MG tablet Take 1- 2 tablets by mouth daily or as directed by coumadin clinic Patient taking differently: Take 2 mg by mouth daily. Take as directed by coumadin clinic 11/01/15  Yes Troy Sine, MD  calcium acetate (PHOSLO) 667 MG capsule Take 1 capsule (667 mg total) by mouth 3 (three) times daily with meals. 10/16/15   Orson Eva, MD  gabapentin (NEURONTIN) 100 MG capsule Take 100 mg by mouth as directed. 1 capsule on dialysis days    Historical Provider, MD    gabapentin (NEURONTIN) 300 MG capsule Take 300 mg by mouth every evening.    Historical Provider, MD  HYDROmorphone (DILAUDID) 2 MG tablet Take 2 mg by mouth. 04/01/16   Historical Provider, MD  Insulin Detemir (LEVEMIR FLEXPEN) 100 UNIT/ML Pen Inject 2-6 Units into the skin See admin instructions. Only if cbg is over 200 12/27/15   Historical Provider, MD  nitroGLYCERIN (NITROSTAT) 0.4 MG SL tablet Place 1  tablet (0.4 mg total) under the tongue every 5 (five) minutes as needed for chest pain. 03/20/14   Silver Huguenin Elgergawy, MD  ondansetron (ZOFRAN ODT) 4 MG disintegrating tablet Take 1 tablet (4 mg total) by mouth every 8 (eight) hours as needed for nausea. 08/13/15   Tanna Furry, MD  senna-docusate (SENOKOT-S) 8.6-50 MG tablet Take 1 tablet by mouth daily.    Historical Provider, MD       Physical Exam: Ht 5' 4.5" (1.638 m) Comment: from 04/17/16  Wt 58.7 kg (129 lb 6.6 oz) Comment: recorded on 04/17/16@ 2:10 PM  BMI 21.87 kg/m  General appearance: Well-developed, adult female, alert and in mild distress from pain.   Eyes: Anicteric, conjunctiva pink, lids and lashes normal. PERRL.    ENT: No nasal deformity, discharge, epistaxis.  Hearing normal. OP moist without lesions.   Dentures on top, edentulous on bottom. Neck: No neck masses.  Trachea midline.  No thyromegaly/tenderness.  Right tunneled line? Lymph: No cervical or supraclavicular lymphadenopathy. Skin: Warm and dry. No suspicious rashes or lesions. Cardiac: RRR, nl S1-S2, actually no murmurs appreciated.  Capillary refill is brisk.  JVP normal.  No LE edema.  Radial pulses 2+ and symmetric.  DP pulse on right diminished, bandaged on left. Respiratory: Normal respiratory rate and rhythm.  CTAB without rales or wheezes. Abdomen: Abdomen soft.  No TTP. MSK: No deformities or effusions.  No cyanosis or clubbing.  The left foot is bandaged above the ankle.  The toe tips are dry and black, no odor. Neuro: Cranial nerves normal.  Sensation  intact to light touch. Speech is fluent.  Muscle strength 5/5 and symmetric.    Psych: Sensorium intact and responding to questions, attention normal.  Behavior appropriate.  Affect normal, but blunted by pain.  Judgment and insight appear normal.     Labs on Admission:  I have personally reviewed following labs and imaging studies: CBC: No results for input(s): WBC, NEUTROABS, HGB, HCT, MCV, PLT in the last 168 hours. Basic Metabolic Panel: No results for input(s): NA, K, CL, CO2, GLUCOSE, BUN, CREATININE, CALCIUM, MG, PHOS in the last 168 hours. GFR: CrCl cannot be calculated (Patient's most recent lab result is older than the maximum 21 days allowed.).  Liver Function Tests: No results for input(s): AST, ALT, ALKPHOS, BILITOT, PROT, ALBUMIN in the last 168 hours. No results for input(s): LIPASE, AMYLASE in the last 168 hours. No results for input(s): AMMONIA in the last 168 hours. Coagulation Profile:  Recent Labs Lab 04/17/16  INR 3.0   Cardiac Enzymes: No results for input(s): CKTOTAL, CKMB, CKMBINDEX, TROPONINI in the last 168 hours. BNP (last 3 results) No results for input(s): PROBNP in the last 8760 hours. HbA1C: No results for input(s): HGBA1C in the last 72 hours. CBG: No results for input(s): GLUCAP in the last 168 hours. Lipid Profile: No results for input(s): CHOL, HDL, LDLCALC, TRIG, CHOLHDL, LDLDIRECT in the last 72 hours. Thyroid Function Tests: No results for input(s): TSH, T4TOTAL, FREET4, T3FREE, THYROIDAB in the last 72 hours. Anemia Panel: No results for input(s): VITAMINB12, FOLATE, FERRITIN, TIBC, IRON, RETICCTPCT in the last 72 hours. Sepsis Labs: Invalid input(s): PROCALCITONIN, LACTICIDVEN No results found for this or any previous visit (from the past 240 hour(s)).       Radiological Exams on Admission: Personally reviewed left foot radiograph report shows no fracture     Assessment/Plan  1. Acute on chronic peripheral vascular disease  with dry gangrene and ischemic pain:    -  Hydromorphone 1 mg q4hrs PRN for pain -Ondansetron for nausea -Consult to Vascular Surgery regarding whether she will need amputation during this hospitalization -CV clearance per Cardiology (see note 04/17/16) -Check CBC and metabolic panel -Strongly urged to quit smoking   2. ESRD on HD MWF:  -Continue Phoslo -HD per Nephrology  3. Liver transplant:  -Check LFTs -Continue Prograf  4. Aortic stenosis s/p AVR:  On warfarin.  Per Dr. Evette Georges note, will need bridging.   -Will go ahead and hold warfarin and bridge with heparin gtt per pharmacy, pending Vascular plans  5. CAD s/p CABG:  Per Dr. Evette Georges note, will need Lexiscan Myoview.  If normal, no further workup. -Myoview ordered -Continue aspirin, metoprolol  6. Hypertension:  Hypertensive at admission. -Treat pain -Continue metoprolol -HD per Nephrology  7. Hypothyroidism: -Continue Levothyroxine  8. Other medications:  -Continue gabapentin     DVT prophylaxis: N/a on heparin gtt bridge  Code Status: DO NOT RESUSCITATE  Family Communication: None present  Disposition Plan: Anticipate IV opioid pain control and consult to vascular surgery.  WIll start heparin bridge and obtain Myoview tomorrow to expedite possible amputation during this hospitalization Consults called: None overnight Admission status: OBS for now         Medical decision making: Patient seen at 9:51 PM on 04/21/2016.  What exists of the patient's chart was reviewed in depth and summarized above, outside records from [facility] ER were obtained and reviewed/summarized above.  Clinical condition: stable.        Edwin Dada [facility] Pager [phone]

## 2016-04-21 NOTE — Progress Notes (Signed)
ANTICOAGULATION CONSULT NOTE - Initial Consult  Pharmacy Consult for Heparin  Indication:  Has warfarin anticoagulation for mech valve, needs bridging  Allergies  Allergen Reactions  . Acetaminophen Other (See Comments)    Liver transplant recipient   . Codeine Itching  . Mirtazapine Other (See Comments)    hallucination  . Morphine Itching and Other (See Comments)    hallucinate    Patient Measurements: Height: 5' 4.5" (163.8 cm) (from 04/17/16) Weight: 129 lb 6.6 oz (58.7 kg) (recorded on 04/17/16@ 2:10 PM) IBW/kg (Calculated) : 55.85 Heparin Dosing Weight: 58.7 kg  Vital Signs:    Labs: No results for input(s): HGB, HCT, PLT, APTT, LABPROT, INR, HEPARINUNFRC, HEPRLOWMOCWT, CREATININE, CKTOTAL, CKMB, TROPONINI in the last 72 hours.  CrCl cannot be calculated (Patient's most recent lab result is older than the maximum 21 days allowed.).   Medical History: Past Medical History:  Diagnosis Date  . Anemia    takes Folic Acid daily  . Anxiety   . Aortic stenosis   . Arthritis    "left hand, back" (08/30/2012)  . Asthma   . CAD (coronary artery disease)   . CAD (coronary artery disease) Jan. 2015   Cath: 20% LAD, 50% D1; s/p LIMA-LAD  . Chest pain 03/06/2015  . Chronic back pain   . Chronic bronchitis ([place])    "q yr w/season changes" (08/30/2012)  . Chronic constipation    takes MIralax and Colace daily  . COPD (chronic obstructive pulmonary disease) ([place])   . Depression    takes Cymbalta for "severe" depression  . End stage renal disease on dialysis [facility]) 02/27/2011   Kidneys shut down at time of liver transplant in Sept 2011 at [facility] in [place], she has been on HD ever since.  Dialyzes at [facility] HD on TTS schedule.  Had L forearm graft used 10 months then removed Dec 2012 due to suspected infection.  A right upper arm AVG was placed Dec 2012 but she developed steal symptoms acutely and it was ligated the same day.  Never had an AV fistula due to small veins.  Now has  L thigh AVG put in Jan 2013, has not clotted to date.    [name] GERD (gastroesophageal reflux disease)    takes Omeprazole daily  . Headache    "at least monthly" (08/30/2012)  . Hepatitis C   . History of blood transfusion    "several" (08/30/2012)  . Hypertension    takes Metoprolol and Lisinopril daily, sees Dr Bea Graff  . Hypothyroidism    takes Synthroid daily  . Migraine    "last migraine was in 2013" (08/30/2012)  . Neuromuscular disorder ([place])    carpal tunnel in right hand  . Obesity   . Peripheral vascular disease (HCC) hands and legs  . Pneumonia    "today and several times before" (08/30/2012)  . 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 SVT (supraventricular tachycardia) ([place]) 06/09/14  . Tobacco abuse   . Type II diabetes mellitus (HCC)    Levemir 2units daily if > 150    Medications:  Prescriptions Prior to Admission  Medication Sig Dispense Refill Last Dose  . aspirin EC 81 MG tablet Take 81 mg by mouth daily.   04/21/2016 at Unknown time  . levothyroxine (SYNTHROID, LEVOTHROID) 175 MCG tablet TAKE 1  TABLET (175 MCG TOTAL) BY MOUTH DAILY BEFORE BREAKFAST.  6 04/21/2016 at Unknown time  . metoprolol tartrate (LOPRESSOR) 25 MG tablet Take 25 mg by mouth 2 (two) times daily.   04/21/2016 at Unknown time  . tacrolimus (PROGRAF) 1 MG capsule Take 2 capsules (2 mg total) by mouth 2 (two) times daily. Take 2 capsules twice daily. 120 capsule 0 04/21/2016 at Unknown time  . warfarin (COUMADIN) 2 MG tablet Take 1- 2 tablets by mouth daily or as directed by coumadin clinic (Patient taking differently: Take 2 mg by mouth daily. Take as directed by coumadin clinic) 45 tablet 3 04/21/2016 at Unknown time  . calcium acetate (PHOSLO) 667 MG capsule Take 1 capsule (667 mg total) by mouth 3 (three) times daily with meals. 30 capsule 1 Taking  . gabapentin  (NEURONTIN) 100 MG capsule Take 100 mg by mouth as directed. 1 capsule on dialysis days   Taking  . gabapentin (NEURONTIN) 300 MG capsule Take 300 mg by mouth every evening.   Taking  . HYDROmorphone (DILAUDID) 2 MG tablet Take 2 mg by mouth.   Taking  . Insulin Detemir (LEVEMIR FLEXPEN) 100 UNIT/ML Pen Inject 2-6 Units into the skin See admin instructions. Only if cbg is over 200   Taking  . nitroGLYCERIN (NITROSTAT) 0.4 MG SL tablet Place 1 tablet (0.4 mg total) under the tongue every 5 (five) minutes as needed for chest pain. 30 tablet 0 Taking  . ondansetron (ZOFRAN ODT) 4 MG disintegrating tablet Take 1 tablet (4 mg total) by mouth every 8 (eight) hours as needed for nausea. 6 tablet 0 Taking  . senna-docusate (SENOKOT-S) 8.6-50 MG tablet Take 1 tablet by mouth daily.   Taking   Scheduled:  . [START ON 04/22/2016] aspirin EC  81 mg Oral Daily  . [START ON 04/22/2016] calcium acetate  667 mg Oral TID WC  . [START ON 04/23/2016] gabapentin  100 mg Oral Q M,W,F-HD  . gabapentin  300 mg Oral QHS  . insulin aspart  0-5 Units Subcutaneous QHS  . [START ON 04/22/2016] insulin aspart  0-9 Units Subcutaneous TID WC  . [START ON 04/22/2016] levothyroxine  175 mcg Oral QAC breakfast  . metoprolol tartrate  25 mg Oral BID  . [START ON 04/22/2016] senna-docusate  1 tablet Oral Daily  . tacrolimus  2 mg Oral BID    Assessment: 61 y.o female with ESRD, HD qMWF presented to [facility] ED 04/21/16 with chief complaint of left  foot pain. The patient has had worsening of her chronic left PVD and left heel ulcer and she is scheduled for elective L BKA on 04/29/16.  She is on coumadin PTA for h/o [employer] mechanical valve.  Previously, cardiology recommend bridging with heparin when she is off warfarin for this surgery. Admitting MD has decided to go ahead and hold warfarin and bridge with heparin gtt per pharmacy, pending Vascular plans.  Admit INR is pending. Last outpatient INR was 3.0 on 04/17/16 (goal INR 2.5-3.5 per  anticoag office note)  PTA coumadin dose is 2 mg daily, last taken pta on 3/12  Goal of Therapy:  Heparin level 0.3-0.7 units/ml INR = 2.5-3.5 (per outpt anticoag office note on 04/17/16 [facility]) Monitor platelets by anticoagulation protocol: Yes   Plan:  Awaiting admit INR  Start IV heparin when INR =/<2.5 Daily INR CBC in AM  Thank you for allowing pharmacy to be part of this patients care team. Nicole Cella, [place] Pharmacist Pager: [phone]  4P-10P today [place] [phone] 04/21/2016,10:04 PM

## 2016-04-22 ENCOUNTER — Observation Stay (HOSPITAL_COMMUNITY): Payer: Medicare Other

## 2016-04-22 ENCOUNTER — Inpatient Hospital Stay (HOSPITAL_COMMUNITY): Admission: RE | Admit: 2016-04-22 | Payer: Medicare Other | Source: Ambulatory Visit

## 2016-04-22 DIAGNOSIS — Z944 Liver transplant status: Secondary | ICD-10-CM | POA: Diagnosis not present

## 2016-04-22 DIAGNOSIS — I2510 Atherosclerotic heart disease of native coronary artery without angina pectoris: Secondary | ICD-10-CM | POA: Diagnosis not present

## 2016-04-22 DIAGNOSIS — N2581 Secondary hyperparathyroidism of renal origin: Secondary | ICD-10-CM | POA: Diagnosis present

## 2016-04-22 DIAGNOSIS — I5042 Chronic combined systolic (congestive) and diastolic (congestive) heart failure: Secondary | ICD-10-CM | POA: Diagnosis not present

## 2016-04-22 DIAGNOSIS — M898X9 Other specified disorders of bone, unspecified site: Secondary | ICD-10-CM | POA: Diagnosis present

## 2016-04-22 DIAGNOSIS — G8929 Other chronic pain: Secondary | ICD-10-CM | POA: Diagnosis not present

## 2016-04-22 DIAGNOSIS — N189 Chronic kidney disease, unspecified: Secondary | ICD-10-CM | POA: Diagnosis not present

## 2016-04-22 DIAGNOSIS — E039 Hypothyroidism, unspecified: Secondary | ICD-10-CM | POA: Diagnosis present

## 2016-04-22 DIAGNOSIS — G546 Phantom limb syndrome with pain: Secondary | ICD-10-CM | POA: Diagnosis not present

## 2016-04-22 DIAGNOSIS — F17210 Nicotine dependence, cigarettes, uncomplicated: Secondary | ICD-10-CM | POA: Diagnosis present

## 2016-04-22 DIAGNOSIS — Z952 Presence of prosthetic heart valve: Secondary | ICD-10-CM | POA: Diagnosis not present

## 2016-04-22 DIAGNOSIS — R079 Chest pain, unspecified: Secondary | ICD-10-CM

## 2016-04-22 DIAGNOSIS — K219 Gastro-esophageal reflux disease without esophagitis: Secondary | ICD-10-CM | POA: Diagnosis present

## 2016-04-22 DIAGNOSIS — R269 Unspecified abnormalities of gait and mobility: Secondary | ICD-10-CM | POA: Diagnosis not present

## 2016-04-22 DIAGNOSIS — D696 Thrombocytopenia, unspecified: Secondary | ICD-10-CM | POA: Diagnosis present

## 2016-04-22 DIAGNOSIS — K5909 Other constipation: Secondary | ICD-10-CM | POA: Diagnosis not present

## 2016-04-22 DIAGNOSIS — J42 Unspecified chronic bronchitis: Secondary | ICD-10-CM | POA: Diagnosis not present

## 2016-04-22 DIAGNOSIS — I70229 Atherosclerosis of native arteries of extremities with rest pain, unspecified extremity: Secondary | ICD-10-CM | POA: Diagnosis present

## 2016-04-22 DIAGNOSIS — N181 Chronic kidney disease, stage 1: Secondary | ICD-10-CM | POA: Diagnosis not present

## 2016-04-22 DIAGNOSIS — D638 Anemia in other chronic diseases classified elsewhere: Secondary | ICD-10-CM | POA: Diagnosis not present

## 2016-04-22 DIAGNOSIS — L97428 Non-pressure chronic ulcer of left heel and midfoot with other specified severity: Secondary | ICD-10-CM | POA: Diagnosis present

## 2016-04-22 DIAGNOSIS — Z7901 Long term (current) use of anticoagulants: Secondary | ICD-10-CM | POA: Diagnosis not present

## 2016-04-22 DIAGNOSIS — M545 Low back pain: Secondary | ICD-10-CM | POA: Diagnosis not present

## 2016-04-22 DIAGNOSIS — E43 Unspecified severe protein-calorie malnutrition: Secondary | ICD-10-CM | POA: Diagnosis not present

## 2016-04-22 DIAGNOSIS — S88112A Complete traumatic amputation at level between knee and ankle, left lower leg, initial encounter: Secondary | ICD-10-CM | POA: Diagnosis not present

## 2016-04-22 DIAGNOSIS — G8918 Other acute postprocedural pain: Secondary | ICD-10-CM | POA: Diagnosis not present

## 2016-04-22 DIAGNOSIS — F329 Major depressive disorder, single episode, unspecified: Secondary | ICD-10-CM | POA: Diagnosis not present

## 2016-04-22 DIAGNOSIS — M79673 Pain in unspecified foot: Secondary | ICD-10-CM | POA: Diagnosis not present

## 2016-04-22 DIAGNOSIS — J410 Simple chronic bronchitis: Secondary | ICD-10-CM | POA: Diagnosis not present

## 2016-04-22 DIAGNOSIS — E1122 Type 2 diabetes mellitus with diabetic chronic kidney disease: Secondary | ICD-10-CM | POA: Diagnosis not present

## 2016-04-22 DIAGNOSIS — Z8669 Personal history of other diseases of the nervous system and sense organs: Secondary | ICD-10-CM | POA: Diagnosis not present

## 2016-04-22 DIAGNOSIS — F4323 Adjustment disorder with mixed anxiety and depressed mood: Secondary | ICD-10-CM | POA: Diagnosis not present

## 2016-04-22 DIAGNOSIS — I70262 Atherosclerosis of native arteries of extremities with gangrene, left leg: Secondary | ICD-10-CM | POA: Diagnosis not present

## 2016-04-22 DIAGNOSIS — Z951 Presence of aortocoronary bypass graft: Secondary | ICD-10-CM | POA: Diagnosis not present

## 2016-04-22 DIAGNOSIS — I472 Ventricular tachycardia: Secondary | ICD-10-CM | POA: Diagnosis present

## 2016-04-22 DIAGNOSIS — I132 Hypertensive heart and chronic kidney disease with heart failure and with stage 5 chronic kidney disease, or end stage renal disease: Secondary | ICD-10-CM | POA: Diagnosis present

## 2016-04-22 DIAGNOSIS — N186 End stage renal disease: Secondary | ICD-10-CM | POA: Diagnosis not present

## 2016-04-22 DIAGNOSIS — D62 Acute posthemorrhagic anemia: Secondary | ICD-10-CM | POA: Diagnosis not present

## 2016-04-22 DIAGNOSIS — E1151 Type 2 diabetes mellitus with diabetic peripheral angiopathy without gangrene: Secondary | ICD-10-CM | POA: Diagnosis not present

## 2016-04-22 DIAGNOSIS — I70244 Atherosclerosis of native arteries of left leg with ulceration of heel and midfoot: Secondary | ICD-10-CM | POA: Diagnosis not present

## 2016-04-22 DIAGNOSIS — L97529 Non-pressure chronic ulcer of other part of left foot with unspecified severity: Secondary | ICD-10-CM | POA: Diagnosis not present

## 2016-04-22 DIAGNOSIS — Z01810 Encounter for preprocedural cardiovascular examination: Secondary | ICD-10-CM | POA: Diagnosis not present

## 2016-04-22 DIAGNOSIS — L97429 Non-pressure chronic ulcer of left heel and midfoot with unspecified severity: Secondary | ICD-10-CM | POA: Diagnosis not present

## 2016-04-22 DIAGNOSIS — E1140 Type 2 diabetes mellitus with diabetic neuropathy, unspecified: Secondary | ICD-10-CM | POA: Diagnosis not present

## 2016-04-22 DIAGNOSIS — I998 Other disorder of circulatory system: Secondary | ICD-10-CM | POA: Diagnosis present

## 2016-04-22 DIAGNOSIS — I10 Essential (primary) hypertension: Secondary | ICD-10-CM | POA: Diagnosis not present

## 2016-04-22 DIAGNOSIS — R52 Pain, unspecified: Secondary | ICD-10-CM | POA: Diagnosis not present

## 2016-04-22 DIAGNOSIS — Z7982 Long term (current) use of aspirin: Secondary | ICD-10-CM | POA: Diagnosis not present

## 2016-04-22 DIAGNOSIS — Z89512 Acquired absence of left leg below knee: Secondary | ICD-10-CM | POA: Diagnosis not present

## 2016-04-22 DIAGNOSIS — D72829 Elevated white blood cell count, unspecified: Secondary | ICD-10-CM | POA: Diagnosis not present

## 2016-04-22 DIAGNOSIS — I739 Peripheral vascular disease, unspecified: Secondary | ICD-10-CM | POA: Diagnosis not present

## 2016-04-22 DIAGNOSIS — Z794 Long term (current) use of insulin: Secondary | ICD-10-CM | POA: Diagnosis not present

## 2016-04-22 DIAGNOSIS — Z4781 Encounter for orthopedic aftercare following surgical amputation: Secondary | ICD-10-CM | POA: Diagnosis present

## 2016-04-22 DIAGNOSIS — M79671 Pain in right foot: Secondary | ICD-10-CM

## 2016-04-22 DIAGNOSIS — R791 Abnormal coagulation profile: Secondary | ICD-10-CM | POA: Diagnosis not present

## 2016-04-22 DIAGNOSIS — E118 Type 2 diabetes mellitus with unspecified complications: Secondary | ICD-10-CM | POA: Diagnosis not present

## 2016-04-22 DIAGNOSIS — I25810 Atherosclerosis of coronary artery bypass graft(s) without angina pectoris: Secondary | ICD-10-CM | POA: Diagnosis not present

## 2016-04-22 DIAGNOSIS — Z992 Dependence on renal dialysis: Secondary | ICD-10-CM | POA: Diagnosis not present

## 2016-04-22 DIAGNOSIS — E1152 Type 2 diabetes mellitus with diabetic peripheral angiopathy with gangrene: Secondary | ICD-10-CM | POA: Diagnosis not present

## 2016-04-22 DIAGNOSIS — J449 Chronic obstructive pulmonary disease, unspecified: Secondary | ICD-10-CM | POA: Diagnosis not present

## 2016-04-22 DIAGNOSIS — Z79899 Other long term (current) drug therapy: Secondary | ICD-10-CM | POA: Diagnosis not present

## 2016-04-22 DIAGNOSIS — E11621 Type 2 diabetes mellitus with foot ulcer: Secondary | ICD-10-CM | POA: Diagnosis not present

## 2016-04-22 DIAGNOSIS — I120 Hypertensive chronic kidney disease with stage 5 chronic kidney disease or end stage renal disease: Secondary | ICD-10-CM | POA: Diagnosis not present

## 2016-04-22 DIAGNOSIS — D631 Anemia in chronic kidney disease: Secondary | ICD-10-CM | POA: Diagnosis not present

## 2016-04-22 LAB — PROTIME-INR
INR: 3.12
INR: 3.05
PROTHROMBIN TIME: 32.8 s — AB (ref 11.4–15.2)
PROTHROMBIN TIME: 32.2 s — AB (ref 11.4–15.2)

## 2016-04-22 LAB — CBC
HCT: 36 % (ref 36.0–46.0)
Hemoglobin: 11.9 g/dL — ABNORMAL LOW (ref 12.0–15.0)
MCH: 29.5 pg (ref 26.0–34.0)
MCHC: 33.1 g/dL (ref 30.0–36.0)
MCV: 89.1 fL (ref 78.0–100.0)
PLATELETS: 85 10*3/uL — AB (ref 150–400)
RBC: 4.04 MIL/uL (ref 3.87–5.11)
RDW: 16.3 % — AB (ref 11.5–15.5)
WBC: 7.2 10*3/uL (ref 4.0–10.5)

## 2016-04-22 LAB — NM MYOCAR MULTI W/SPECT W/WALL MOTION / EF
CSEPPHR: 85 {beats}/min
Estimated workload: 1 METS
Exercise duration (min): 5 min
Exercise duration (sec): 15 s
Rest HR: 64 {beats}/min

## 2016-04-22 LAB — GLUCOSE, CAPILLARY
GLUCOSE-CAPILLARY: 70 mg/dL (ref 65–99)
Glucose-Capillary: 85 mg/dL (ref 65–99)
Glucose-Capillary: 94 mg/dL (ref 65–99)
Glucose-Capillary: 100 mg/dL — ABNORMAL HIGH (ref 65–99)

## 2016-04-22 MED ORDER — POLYETHYLENE GLYCOL 3350 17 G PO PACK
17.0000 g | PACK | Freq: Every day | ORAL | Status: DC | PRN
Start: 2016-04-22 — End: 2016-04-30
  Administered 2016-04-22 – 2016-04-23 (×2): 17 g via ORAL
  Filled 2016-04-22 (×2): qty 1

## 2016-04-22 MED ORDER — SENNOSIDES-DOCUSATE SODIUM 8.6-50 MG PO TABS
2.0000 | ORAL_TABLET | Freq: Every day | ORAL | Status: DC
  Administered 2016-04-23 – 2016-04-29 (×7): 2 via ORAL
  Filled 2016-04-22 (×7): qty 2

## 2016-04-22 MED ORDER — REGADENOSON 0.4 MG/5ML IV SOLN
INTRAVENOUS | Status: AC
  Administered 2016-04-22: 0.4 mg via INTRAVENOUS
  Filled 2016-04-22: qty 5

## 2016-04-22 MED ORDER — OXYCODONE HCL 5 MG PO TABS
5.0000 mg | ORAL_TABLET | ORAL | Status: DC | PRN
  Administered 2016-04-22 – 2016-04-30 (×20): 5 mg via ORAL
  Filled 2016-04-22 (×19): qty 1

## 2016-04-22 MED ORDER — TECHNETIUM TC 99M TETROFOSMIN IV KIT
30.0000 | PACK | Freq: Once | INTRAVENOUS | Status: AC | PRN
  Administered 2016-04-22: 30 via INTRAVENOUS

## 2016-04-22 MED ORDER — REGADENOSON 0.4 MG/5ML IV SOLN
0.4000 mg | Freq: Once | INTRAVENOUS | Status: AC
  Administered 2016-04-22: 0.4 mg via INTRAVENOUS
  Filled 2016-04-22: qty 5

## 2016-04-22 MED ORDER — TECHNETIUM TC 99M TETROFOSMIN IV KIT
10.0000 | PACK | Freq: Once | INTRAVENOUS | Status: AC | PRN
  Administered 2016-04-22: 10 via INTRAVENOUS

## 2016-04-22 MED ORDER — HYDRALAZINE HCL 20 MG/ML IJ SOLN
10.0000 mg | INTRAMUSCULAR | Status: DC | PRN
  Administered 2016-04-23: 10 mg via INTRAVENOUS
  Filled 2016-04-22: qty 1

## 2016-04-22 MED ORDER — HYDROMORPHONE HCL 1 MG/ML IJ SOLN
1.0000 mg | INTRAMUSCULAR | Status: DC | PRN
  Administered 2016-04-22 – 2016-04-27 (×22): 1 mg via INTRAVENOUS
  Filled 2016-04-22 (×19): qty 1

## 2016-04-22 MED FILL — Fentanyl Citrate Preservative Free (PF) Inj 100 MCG/2ML: INTRAMUSCULAR | Qty: 2 | Status: AC

## 2016-04-22 NOTE — Progress Notes (Addendum)
[place] for Warfarin> Heparin  Indication:  St Jude Mechanical valve- bridging for procedure  Allergies  Allergen Reactions  . Acetaminophen Other (See Comments)    Liver transplant recipient   . Codeine Itching  . Mirtazapine Other (See Comments)    hallucination  . Morphine Itching and Other (See Comments)    hallucinate    Patient Measurements: Height: 5' 4.5" (163.8 cm) (from 04/17/16) Weight: 129 lb 6.6 oz (58.7 kg) (recorded on 04/17/16@ 2:10 PM) IBW/kg (Calculated) : 55.85 Heparin Dosing Weight: 58.7 kg  Vital Signs: Temp: 98.3 F (36.8 C) (03/13 1703) Temp Source: Oral (03/13 1703) BP: 179/61 (03/13 1703) Pulse Rate: 73 (03/13 1703)  Labs:  Recent Labs  04/21/16 2157 04/22/16 0645 04/22/16 1911  HGB 13.8 11.9*  --   HCT 41.4 36.0  --   PLT 133* 85*  --   LABPROT 29.7* 32.8* 32.2*  INR 2.76 3.12 3.05  CREATININE 3.51*  --   --     Estimated Creatinine Clearance: 14.9 mL/min (by C-G formula based on SCr of 3.51 mg/dL (H)).   Assessment: 61 y.o female with ESRD on HD-MWF presented to [facility] ED 04/21/16 with chief complaint of left  foot pain. The patient has had worsening of her chronic left PVD and left heel ulcer and she is scheduled for elective L BKA on 04/29/16.   She is on Coumadin PTA for h/o St. Jude mechanical valve- she is to bridge with heparin for procedure- to start when INR </= 2.5.   PTA Coumadin dose is 2mg  daily- last taken pta on 3/12.  INR this evening is still elevated at 3.05. Per vascular, plan is for BKA when INR is <2.  Hgb 11.9, plts are 85 < 133 no bleeding noted.  Goal of Therapy:  Heparin level 0.3-0.7 units/ml INR = 2.5-3.5 (per outpt anticoag office note on 04/17/16 [facility]) Monitor platelets by anticoagulation protocol: Yes   Plan:  INR on 3/14 am  Start IV heparin when INR =/<2.5 Daily INR and CBC   Vincenza Hews, PharmD, BCPS 04/22/2016, 8:07 PM

## 2016-04-22 NOTE — Progress Notes (Signed)
[place] for Warfarin> Heparin  Indication:  St Jude Mechanical valve- bridging for procedure  Allergies  Allergen Reactions  . Acetaminophen Other (See Comments)    Liver transplant recipient   . Codeine Itching  . Mirtazapine Other (See Comments)    hallucination  . Morphine Itching and Other (See Comments)    hallucinate    Patient Measurements: Height: 5' 4.5" (163.8 cm) (from 04/17/16) Weight: 129 lb 6.6 oz (58.7 kg) (recorded on 04/17/16@ 2:10 PM) IBW/kg (Calculated) : 55.85 Heparin Dosing Weight: 58.7 kg  Vital Signs: Temp: 98.1 F (36.7 C) (03/13 0420) BP: 157/54 (03/13 0420) Pulse Rate: 71 (03/13 0420)  Labs:  Recent Labs  04/21/16 2157 04/22/16 0645  HGB 13.8 11.9*  HCT 41.4 36.0  PLT 133* PENDING  LABPROT 29.7* 32.8*  INR 2.76 3.12  CREATININE 3.51*  --     Estimated Creatinine Clearance: 14.9 mL/min (by C-G formula based on SCr of 3.51 mg/dL (H)).   Assessment: 61 y.o female with ESRD on HD-MWF presented to [facility] ED 04/21/16 with chief complaint of left  foot pain. The patient has had worsening of her chronic left PVD and left heel ulcer and she is scheduled for elective L BKA on 04/29/16.   She is on Coumadin PTA for h/o St. Jude mechanical valve- she is to bridge with heparin for procedure- to start when INR </= 2.5.   PTA Coumadin dose is 2mg  daily- last taken pta on 3/12.  INR this morning still elevated at 3.12. Per vascular, plan is for BKA when INR is <2.  Hgb 11.9, plts are still pending- no bleeding noted.  Goal of Therapy:  Heparin level 0.3-0.7 units/ml INR = 2.5-3.5 (per outpt anticoag office note on 04/17/16 [facility]) Monitor platelets by anticoagulation protocol: Yes   Plan:  INR at 1800 Start IV heparin when INR =/<2.5 Daily INR and CBC   Dymin Dingledine D. Rox Mcgriff, PharmD, BCPS Clinical Pharmacist Pager: [phone] 04/22/2016 8:43 AM

## 2016-04-22 NOTE — Progress Notes (Signed)
Patient presented for Lexiscan. Tolerated procedure well. Pending final stress imaging result.  

## 2016-04-22 NOTE — Progress Notes (Addendum)
PROGRESS NOTE  [name]  WGN:[id number] DOB: 08-Jan-1956 DOA: 04/21/2016 PCP: No primary care provider on file.  Outpatient Specialists: [facility], Dr. Scot [name]  Brief Narrative: [name] is a 61 y.o. female with a past medical history significant for ESRD on HD MWF, s/p Liver transplant for hep C followed at [facility], AS s/p AVR and CABG in 2015 on warfarin who presented with acute worsening of chronic foot pain due to PVD. Her vascular surgeon, Dr. Scot [name], had planned BKA 3/20, though she became unable to control the pain, described as constant, excruciating and not relieved by oral narcotics at home. On presentation, she was afebrile, heart rate 93, respirations 21, BP 210/88, SpO2 96%. INR 3.0, WBC 8.7, hgb 13.8. The patient was admitted for pain control, coumadin held and started on heparin. A stress myoview was ordered for surgical risk stratification. Vascular surgery was consulted and plans on inpatient BKA once INR drops below 2.0.   Assessment & Plan: Principal Problem:   Claudication of left lower extremity ([place]) Active Problems:   Hypothyroidism   Type 2 diabetes mellitus with chronic kidney disease ([place])   Anemia due to chronic renal failure    End stage renal disease on dialysis (HCC)   CAD (coronary artery disease), native coronary artery   History of prosthetic aortic valve replacement   Chronic combined systolic and diastolic congestive heart failure (HCC)   COPD (chronic obstructive pulmonary disease) (HCC)   Chronic anticoagulation w/ coumadin, goal INR 2.5-3.0 w/ AVR   S/P liver transplant (HCC)   Foot pain  Acute on chronic peripheral vascular disease with dry gangrene and intractable ischemic pain:    - Increasing frequency > dilaudid 1mg  q3h prn pain - Zofran prn nausea - [facility] consulted, recommending inpatient BKA once INR <1.8.  - CV clearance per cardiology (see note 04/17/16) - Strongly urged to quit smoking  ESRD on HD MWF: Sees Dr. Justin [name] as  outpatient. - HD per nephrology - Continue phoslo  Liver transplant: Followed at [facility] hepatology.  - Continue prograf.  - I've spoken with the transplant hepatologist on-call who reviewed the records, recommends continue tacrolimus at this dose.   Aortic stenosis s/p mechanical AVR: On warfarin.  Per Dr. Evette [name] note, will need bridging.   - On heparin gtt and holding coumadin, monitoring INR. Giving reversal agent would put her at high risk of clotting.  CAD s/p CABG: Per Dr. Evette [name] note, will need Lexiscan Myoview.  If normal, no further workup. - Myoview pending - Continue aspirin, metoprolol  Hypertension:  - Treat pain - Continue metoprolol - HD per nephrology - Hydralazine prn ordered  Hypothyroidism:  - Continue levothyroxine  DVT prophylaxis: Heparin gtt Code Status: DNR Family Communication: None at bedside Disposition Plan: Will change to inpatient as she will require IV pain management and planned BKA as an inpatient.   Consultants:   [facility], Dr. Scot [name]  Cardiology  Nephrology  Procedures:   Myoview 3/13 pending  Antimicrobials:  None   Subjective: Pt with improved pain, down to a reasonable level for 3-4 hours when given IV dilaudid.   Objective: Vitals:   04/22/16 0931 04/22/16 0933 04/22/16 0935 04/22/16 1027  BP:  (!) 159/68 (!) 159/68 (!) 183/58  Pulse: 82 83 79 69  Resp: 18 18  17   Temp:    97.9 F (36.6 C)  TempSrc:    Oral  SpO2:    100%  Weight:      Height:  Intake/Output Summary (Last 24 hours) at 04/22/16 1208 Last data filed at 04/22/16 1032  Gross per 24 hour  Intake                0 ml  Output                0 ml  Net                0 ml   Filed Weights   04/21/16 2200  Weight: 58.7 kg (129 lb 6.6 oz)   Examination: General exam: 61 y.o. female in evident pain Respiratory system: Non-labored breathing ambient air. Clear to auscultation bilaterally.  Cardiovascular system: Regular rate and rhythm. No murmur,  rub, or gallop. No JVD, and no pedal edema. 2+ radial pulses. Absent DP pulses. Left thigh graft +thrill.  Gastrointestinal system: Abdomen soft, non-tender, non-distended, with normoactive bowel sounds. No organomegaly or masses felt. Central nervous system: Alert and oriented. No focal neurological deficits. Extremities: Warm, no deformities Skin: Left heel ulcer ~3cm x 4cm and dry gangrene at toes. Psychiatry: Judgement and insight appear normal. Mood & affect appropriate.   Data Reviewed: I have personally reviewed following labs and imaging studies  CBC:  Recent Labs Lab 04/21/16 2157 04/22/16 0645  WBC 8.7 7.2  NEUTROABS 3.6  --   HGB 13.8 11.9*  HCT 41.4 36.0  MCV 88.7 89.1  PLT 133* 85*   Basic Metabolic Panel:  Recent Labs Lab 04/21/16 2157  NA 137  K 3.5  CL 97*  CO2 29  GLUCOSE 99  BUN 5*  CREATININE 3.51*  CALCIUM 8.3*   GFR: Estimated Creatinine Clearance: 14.9 mL/min (by C-G formula based on SCr of 3.51 mg/dL (H)). Liver Function Tests:  Recent Labs Lab 04/21/16 2157  AST 60*  ALT 14  ALKPHOS 226*  BILITOT 1.5*  PROT 7.5  ALBUMIN 2.3*   No results for input(s): LIPASE, AMYLASE in the last 168 hours. No results for input(s): AMMONIA in the last 168 hours. Coagulation Profile:  Recent Labs Lab 04/17/16 04/21/16 2157 04/22/16 0645  INR 3.0 2.76 3.12   Cardiac Enzymes: No results for input(s): CKTOTAL, CKMB, CKMBINDEX, TROPONINI in the last 168 hours. BNP (last 3 results) No results for input(s): PROBNP in the last 8760 hours. HbA1C: No results for input(s): HGBA1C in the last 72 hours. CBG:  Recent Labs Lab 04/21/16 2218 04/22/16 1024 04/22/16 1146  GLUCAP 88 70 85   Lipid Profile: No results for input(s): CHOL, HDL, LDLCALC, TRIG, CHOLHDL, LDLDIRECT in the last 72 hours. Thyroid Function Tests: No results for input(s): TSH, T4TOTAL, FREET4, T3FREE, THYROIDAB in the last 72 hours. Anemia Panel: No results for input(s):  VITAMINB12, FOLATE, FERRITIN, TIBC, IRON, RETICCTPCT in the last 72 hours. Urine analysis:    Component Value Date/Time   COLORURINE YELLOW 11/02/2012 0428   APPEARANCEUR CLOUDY (A) 11/02/2012 0428   LABSPEC 1.011 11/02/2012 0428   PHURINE 8.0 11/02/2012 0428   GLUCOSEU 250 (A) 11/02/2012 0428   HGBUR LARGE (A) 11/02/2012 0428   BILIRUBINUR NEGATIVE 11/02/2012 0428   KETONESUR NEGATIVE 11/02/2012 0428   PROTEINUR >300 (A) 11/02/2012 0428   UROBILINOGEN 0.2 11/02/2012 0428   NITRITE NEGATIVE 11/02/2012 0428   LEUKOCYTESUR NEGATIVE 11/02/2012 0428   No results found for this or any previous visit (from the past 240 hour(s)).    Radiology Studies: No results found.  Scheduled Meds: . aspirin EC  81 mg Oral Daily  . calcium acetate  667 mg  Oral TID WC  . [START ON 04/23/2016] gabapentin  100 mg Oral Q M,W,F-HD  . gabapentin  300 mg Oral QHS  . insulin aspart  0-5 Units Subcutaneous QHS  . insulin aspart  0-9 Units Subcutaneous TID WC  . levothyroxine  175 mcg Oral QAC breakfast  . metoprolol tartrate  25 mg Oral BID  . senna-docusate  1 tablet Oral Daily  . tacrolimus  2 mg Oral BID   Continuous Infusions:   LOS: 1 day   Time spent: 25 minutes.  Vance Gather, MD [facility] Pager [phone]  If 7PM-7AM, please contact night-coverage [email or web address] Password [facility] 04/22/2016, 12:08 PM

## 2016-04-22 NOTE — Consult Note (Signed)
[facility] [place] CONSULT NOTE  Reason for Consult: uncontrolled left foot pain with non healing left heel wound. Referring Physician:   History of Present Illness: 61 y/o female with chronic left heel wound.  She was scheduled for left BKA on 04/29/2016, but her pain has become intolerable.   Her most recent intervention was an angiogram with Atherectomy, left superficial femoral artery and Drug coated balloon angioplasty, left superficial femoral artery.  She has been on Neurontin for to try and help with her neuropathic pain control.  At this time nothing is helping.  We are seeing her today to provide intervention for her pain by offering her a left BKA.  Pt has no revascularization options to her lower extremities.  Her atherosclerotic risk factors include continued active smoking for 40 years, DM, CAD, and ESRD.  She is on coumadin therapy secondary to a mechanical valve replacement and prior liver transplant.    Current Facility-Administered Medications  Medication Dose Route Frequency Provider Last Rate Last Dose  . regadenoson (LEXISCAN) 0.4 MG/5ML injection SOLN           . aspirin EC tablet 81 mg  81 mg Oral Daily Edwin Dada, MD      . calcium acetate (PHOSLO) capsule 667 mg  667 mg Oral TID WC Edwin Dada, MD      . Derrill Memo ON 04/23/2016] gabapentin (NEURONTIN) capsule 100 mg  100 mg Oral Q M,W,F-HD Edwin Dada, MD      . gabapentin (NEURONTIN) capsule 300 mg  300 mg Oral QHS Edwin Dada, MD   300 mg at 04/21/16 2319  . HYDROmorphone (DILAUDID) injection 1 mg  1 mg Intravenous Q4H PRN Edwin Dada, MD   1 mg at 04/22/16 0630  . insulin aspart (novoLOG) injection 0-5 Units  0-5 Units Subcutaneous QHS Edwin Dada, MD      . insulin aspart (novoLOG) injection 0-9 Units  0-9 Units Subcutaneous TID WC Edwin Dada, MD      . levothyroxine (SYNTHROID, LEVOTHROID) tablet 175 mcg  175 mcg Oral QAC  breakfast Edwin Dada, MD   175 mcg at 04/22/16 0734  . metoprolol tartrate (LOPRESSOR) tablet 25 mg  25 mg Oral BID Edwin Dada, MD   25 mg at 04/21/16 2319  . ondansetron (ZOFRAN) tablet 4 mg  4 mg Oral Q6H PRN Edwin Dada, MD       Or  . ondansetron (ZOFRAN) injection 4 mg  4 mg Intravenous Q6H PRN Edwin Dada, MD   4 mg at 04/21/16 2213  . oxyCODONE (Oxy IR/ROXICODONE) immediate release tablet 5 mg  5 mg Oral Q4H PRN Edwin Dada, MD   5 mg at 04/22/16 0734  . senna-docusate (Senokot-S) tablet 1 tablet  1 tablet Oral Daily Edwin Dada, MD      . tacrolimus (PROGRAF) capsule 2 mg  2 mg Oral BID Edwin Dada, MD   2 mg at 04/21/16 2319    Pt meds include: Statin :No Betablocker: Yes ASA: Yes Other anticoagulants/antiplatelets: coumadin on hold bridging with heparin  Past Medical History:  Diagnosis Date  . Anemia    takes Folic Acid daily  . Anxiety   . Aortic stenosis   . Arthritis    "left hand, back" (08/30/2012)  . Asthma   . CAD (coronary artery disease)   . CAD (coronary artery disease) Jan. 2015   Cath: 20% LAD, 50% D1; s/p  LIMA-LAD  . Chest pain 03/06/2015  . Chronic back pain   . Chronic bronchitis ([place])    "q yr w/season changes" (08/30/2012)  . Chronic constipation    takes MIralax and Colace daily  . COPD (chronic obstructive pulmonary disease) ([place])   . Depression    takes Cymbalta for "severe" depression  . End stage renal disease on dialysis [facility]) 02/27/2011   Kidneys shut down at time of liver transplant in Sept 2011 at [facility] in [place], she has been on HD ever since.  Dialyzes at [facility] HD on TTS schedule.  Had L forearm graft used 10 months then removed Dec 2012 due to suspected infection.  A right upper arm AVG was placed Dec 2012 but she developed steal symptoms acutely and it was ligated the same day.  Never had an AV fistula due to small veins.  Now has L thigh AVG put in Jan 2013, has  not clotted to date.    [name] GERD (gastroesophageal reflux disease)    takes Omeprazole daily  . Headache    "at least monthly" (08/30/2012)  . Hepatitis C   . History of blood transfusion    "several" (08/30/2012)  . Hypertension    takes Metoprolol and Lisinopril daily, sees Dr Bea Graff  . Hypothyroidism    takes Synthroid daily  . Migraine    "last migraine was in 2013" (08/30/2012)  . Neuromuscular disorder ([place])    carpal tunnel in right hand  . Obesity   . Peripheral vascular disease (HCC) hands and legs  . Pneumonia    "today and several times before" (08/30/2012)  . 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 SVT (supraventricular tachycardia) ([place]) 06/09/14  . Tobacco abuse   . Type II diabetes mellitus (HCC)    Levemir 2units daily if > 150    Past Surgical History:  Procedure Laterality Date  . AORTIC VALVE REPLACEMENT N/A 03/15/2013   AVR; Surgeon: Ivin Poot, MD;  Location: [facility] OR; Open Heart Surgery;  68mm[employer] mechanical prosthesis, top hat valve  . ARTERIOVENOUS GRAFT PLACEMENT Left 10/03/10    forearm  . AV FISTULA PLACEMENT  01/29/2011   Procedure: INSERTION OF ARTERIOVENOUS (AV) GORE-TEX GRAFT ARM;  Surgeon: Elam Dutch, MD;  Location: [facility] OR;  Service: Vascular;  Laterality: Right;  . AV FISTULA PLACEMENT  03/10/2011   Procedure: INSERTION OF ARTERIOVENOUS (AV) GORE-TEX GRAFT THIGH;  Surgeon: Elam Dutch, MD;  Location: [place];  Service: Vascular;  Laterality: Left;  . [place] REMOVAL  12/23/2010   Procedure: REMOVAL OF ARTERIOVENOUS GORETEX GRAFT ([place]);  Surgeon: Elam Dutch, MD;  Location: [place];  Service: Vascular;  Laterality: Left;  procedure started @1736 -1852  . CHOLECYSTECTOMY  1993  . CORONARY ARTERY BYPASS GRAFT N/A 03/15/2013   Procedure: CORONARY ARTERY BYPASS GRAFTING (CABG) times one using left internal  mammary artery.;  Surgeon: Ivin Poot, MD;  Location: [place];  Service: Open Heart Surgery;  Laterality: N/A;  POSS CABG X 1  . CYSTOSCOPY  1990's  . INSERTION OF DIALYSIS CATHETER  12/23/2010   Procedure: INSERTION OF DIALYSIS CATHETER;  Surgeon: Elam Dutch, MD;  Location: [place];  Service: Vascular;  Laterality: Right;  Right Internal Jugular 28cm dialysis catheter insertion procedure time 1701-1720   . INSERTION OF DIALYSIS  CATHETER Right 03/11/2016   Procedure: INSERTION OF DIALYSIS CATHETER;  Surgeon: Angelia Mould, MD;  Location: [place];  Service: Vascular;  Laterality: Right;  . INTRAOPERATIVE TRANSESOPHAGEAL ECHOCARDIOGRAM N/A 03/15/2013   Procedure: INTRAOPERATIVE TRANSESOPHAGEAL ECHOCARDIOGRAM;  Surgeon: Ivin Poot, MD;  Location: [place];  Service: Open Heart Surgery;  Laterality: N/A;  . LEFT HEART CATHETERIZATION WITH CORONARY ANGIOGRAM N/A 07/29/2012   Procedure: LEFT HEART CATHETERIZATION WITH CORONARY ANGIOGRAM;  Surgeon: Troy Sine, MD;  Location: [facility] CATH LAB;  Service: Cardiovascular;  Laterality: N/A;  . LEFT HEART CATHETERIZATION WITH CORONARY ANGIOGRAM N/A 03/10/2013   Procedure: LEFT HEART CATHETERIZATION WITH CORONARY ANGIOGRAM;  Surgeon: Troy Sine, MD;  Location: [facility] CATH LAB;  Service: Cardiovascular;  Laterality: N/A;  . LEFT HEART CATHETERIZATION WITH CORONARY/GRAFT ANGIOGRAM N/A 12/24/2011   Procedure: LEFT HEART CATHETERIZATION WITH Beatrix Fetters;  Surgeon: [name], MD;  Location: [facility] CATH LAB;  Service: Cardiovascular;  Laterality: N/A;  . LEFT HEART CATHETERIZATION WITH CORONARY/GRAFT ANGIOGRAM N/A 12/16/2013   Procedure: LEFT HEART CATHETERIZATION WITH Beatrix Fetters;  Surgeon: Troy Sine, MD;  Location: [facility] CATH LAB;  Service: Cardiovascular;  Laterality: N/A;  . LIGATION ARTERIOVENOUS GORTEX GRAFT Left 03/11/2016   Procedure: LIGATION THIGH ARTERIOVENOUS GORTEX GRAFT;  Surgeon: Angelia Mould, MD;  Location:  [place];  Service: Vascular;  Laterality: Left;  . LIVER TRANSPLANT  10/25/2009   sees Dr Ferol Luz 1 every 6 months, saw last in Dec 2013. [name] Coord [phone]  . PERIPHERAL VASCULAR CATHETERIZATION N/A 11/06/2015   Procedure: Abdominal Aortogram;  Surgeon: Serafina Mitchell, MD;  Location: [place] CV LAB;  Service: Cardiovascular;  Laterality: N/A;  . PERIPHERAL VASCULAR CATHETERIZATION N/A 11/06/2015   Procedure: Lower Extremity Angiography;  Surgeon: Serafina Mitchell, MD;  Location: [place] CV LAB;  Service: Cardiovascular;  Laterality: N/A;  . PERIPHERAL VASCULAR CATHETERIZATION  11/06/2015   Procedure: Peripheral Vascular Atherectomy;  Surgeon: Serafina Mitchell, MD;  Location: [place] CV LAB;  Service: Cardiovascular;;  Left Superficial femoral  . SHUNTOGRAM Left 05/15/2014   Procedure: SHUNTOGRAM;  Surgeon: Conrad [place], MD;  Location[facility] CATH LAB;  Service: Cardiovascular;  Laterality: Left;  . SMALL INTESTINE SURGERY  90's  . SPINAL GROWTH RODS  2010   "put 2 metal rods in my back; they had detetriorated" (08/30/2012)  . THROMBECTOMY    . THROMBECTOMY AND REVISION OF ARTERIOVENTOUS (AV) GORETEX  GRAFT Left 03/30/2014  . THROMBECTOMY AND REVISION OF ARTERIOVENTOUS (AV) GORETEX  GRAFT Left 03/30/2014   Procedure: THROMBECTOMY AND REVISION OF ARTERIOVENTOUS (AV) GORETEX  GRAFT;  Surgeon: Conrad [place], MD;  Location: [place];  Service: Vascular;  Laterality: Left;  . TUBAL LIGATION  1990's    Social History Social History  Substance Use Topics  . Smoking status: Current Every Day Smoker    Packs/day: 0.50    Years: 40.00    Types: Cigarettes  . Smokeless tobacco: Never Used     Comment: Almost 1 pk per day  . Alcohol use No    Family History Family History  Problem Relation Age of Onset  . Cancer Mother   . Diabetes Mother   . Hypertension Mother   . Stroke Mother   . Cancer Father   . Anesthesia problems Neg Hx   . Hypotension Neg Hx   . Malignant hyperthermia  Neg Hx   . Pseudochol deficiency Neg Hx     Allergies  Allergen Reactions  . Acetaminophen  Other (See Comments)    Liver transplant recipient   . Codeine Itching  . Mirtazapine Other (See Comments)    hallucination  . Morphine Itching and Other (See Comments)    hallucinate     REVIEW OF SYSTEMS  General: [ ]  Weight loss, [ ]  Fever, [ ]  chills Neurologic: [ ]  Dizziness, [ ]  Blackouts, [ ]  Seizure [ ]  Stroke, [ ]  "Mini stroke", [ ]  Slurred speech, [ ]  Temporary blindness; [ ]  weakness in arms or legs, [ ]  Hoarseness [ ]  Dysphagia Cardiac: [ ]  Chest pain/pressure, [ ]  Shortness of breath at rest [ ]  Shortness of breath with exertion, [ ]  Atrial fibrillation or irregular heartbeat  Vascular: [ ]  Pain in legs with walking, [ ]  Pain in legs at rest, [ ]  Pain in legs at night,  [ ]  Non-healing ulcer, [ ]  Blood clot in vein/DVT,   Pulmonary: [ ]  Home oxygen, [ ]  Productive cough, [ ]  Coughing up blood, [ ]  Asthma,  [ ]  Wheezing [ ]  COPD Musculoskeletal:  [ ]  Arthritis, [ ]  Low back pain, [ ]  Joint pain Hematologic: [ ]  Easy Bruising, [ ]  Anemia; [ ]  Hepatitis Gastrointestinal: [ ]  Blood in stool, [ ]  Gastroesophageal Reflux/heartburn, Urinary: [ ]  chronic Kidney disease, [ ]  on HD - [ ]  MWF or [ ]  TTHS, [ ]  Burning with urination, [ ]  Difficulty urinating Skin: [ ]  Rashes, [ ]  Wounds Psychological: [ ]  Anxiety, [ ]  Depression  Physical Examination Vitals:   04/21/16 2200 04/22/16 0420  BP:  (!) 157/54  Pulse:  71  Resp:  18  Temp:  98.1 F (36.7 C)  SpO2:  94%  Weight: 129 lb 6.6 oz (58.7 kg)   Height: 5' 4.5" (1.638 m)    Body mass index is 21.87 kg/m.  General:  WDWN in NAD  HENT: WNL Eyes: Pupils equal Pulmonary: normal non-labored breathing , without Rales, rhonchi,  wheezing Cardiac: RRR, without  Murmurs, rubs or gallops; No carotid bruits Abdomen: soft, NT, no masses Skin:  left heel ulcer that measures about 3 cm x 4 cm no change, dry gangrene toes  tips  Vascular Exam/Pulses:2+ radial, brachial, femoral, absent dorsalis pedis, posterior tibial pulses bilaterally, pulsatile left thigh graft  Musculoskeletal: no muscle wasting or atrophy; no edema  Neurologic: A&O X 3; Appropriate Affect ;  SENSATION: normal; MOTOR FUNCTION: Motor grossly intact Speech is fluent/normal   Significant Diagnostic Studies: CBC Lab Results  Component Value Date   WBC PENDING 04/22/2016   HGB 11.9 (L) 04/22/2016   HCT 36.0 04/22/2016   MCV 89.1 04/22/2016   PLT PENDING 04/22/2016    BMET    Component Value Date/Time   NA 137 04/21/2016 2157   K 3.5 04/21/2016 2157   CL 97 (L) 04/21/2016 2157   CO2 29 04/21/2016 2157   GLUCOSE 99 04/21/2016 2157   BUN 5 (L) 04/21/2016 2157   CREATININE 3.51 (H) 04/21/2016 2157   CREATININE 6.42 (H) 01/17/2016 1233   CALCIUM 8.3 (L) 04/21/2016 2157   GFRNONAA 13 (L) 04/21/2016 2157   GFRAA 15 (L) 04/21/2016 2157   Estimated Creatinine Clearance: 14.9 mL/min (by C-G formula based on SCr of 3.51 mg/dL (H)).  COAG Lab Results  Component Value Date   INR 3.12 04/22/2016   INR 2.76 04/21/2016   INR 3.0 04/17/2016    ASSESSMENT/PLAN:  Pt has no revascularization options to her lower extremities. She is undergoing a stress test currently.  She  is on coumadin and her INR 3.12.   Plan left BKA when INR is below 1.8.  The patient has agreed to the surgery.  Laurence Slate [facility] 04/22/2016 8:24 AM  I have interviewed the patient and examined the patient. I agree with the findings by the PA. Plan Left BKA Thursday if INR < 1.8  Gae Gallop, MD [phone]

## 2016-04-22 NOTE — Care Management Note (Signed)
Case Management Note  Patient Details  Name: [name] MRN: [id number] Date of Birth: Sep 09, 1955  Subjective/Objective:       CM following for progression and d/c planning.              Action/Plan: 04/22/2016 Notified by [facility] that this pt is active with that agency for [facility] services. Pt currently adm for pain control and for BKA as soon as INR is 1.8 or less. Currently requiring Dilaudid 1mg   q 4-6 hr for pain control.  04/25/2016 Informed by MD that CIR may be option for this pt await PT/OT eval. Pt is post op 24 hr for BKA on 04/24/2016.  Expected Discharge Date:  04/28/2016             Expected Discharge Plan:   (To be determined)  In-House Referral:  Clinical Social Work  Discharge planning Services     Post Acute Care Choice:    Choice offered to:     DME Arranged:    DME Agency:     HH Arranged:    [place] Agency:     Status of Service:  In process, will continue to follow  If discussed at Long Length of Stay Meetings, dates discussed:    Additional Comments:  Adron Bene, RN 04/22/2016, 2:13 PM

## 2016-04-23 DIAGNOSIS — N186 End stage renal disease: Secondary | ICD-10-CM

## 2016-04-23 DIAGNOSIS — Z952 Presence of prosthetic heart valve: Secondary | ICD-10-CM

## 2016-04-23 DIAGNOSIS — Z992 Dependence on renal dialysis: Secondary | ICD-10-CM

## 2016-04-23 DIAGNOSIS — N189 Chronic kidney disease, unspecified: Secondary | ICD-10-CM

## 2016-04-23 DIAGNOSIS — D631 Anemia in chronic kidney disease: Secondary | ICD-10-CM

## 2016-04-23 DIAGNOSIS — I2510 Atherosclerotic heart disease of native coronary artery without angina pectoris: Secondary | ICD-10-CM

## 2016-04-23 DIAGNOSIS — Z7901 Long term (current) use of anticoagulants: Secondary | ICD-10-CM

## 2016-04-23 DIAGNOSIS — I998 Other disorder of circulatory system: Secondary | ICD-10-CM

## 2016-04-23 LAB — RENAL FUNCTION PANEL
Albumin: 1.8 g/dL — ABNORMAL LOW (ref 3.5–5.0)
Anion gap: 10 (ref 5–15)
BUN: 15 mg/dL (ref 6–20)
CHLORIDE: 97 mmol/L — AB (ref 101–111)
CO2: 31 mmol/L (ref 22–32)
Calcium: 8.2 mg/dL — ABNORMAL LOW (ref 8.9–10.3)
Creatinine, Ser: 6.21 mg/dL — ABNORMAL HIGH (ref 0.44–1.00)
GFR, EST AFRICAN AMERICAN: 8 mL/min — AB (ref >60)
GFR, EST NON AFRICAN AMERICAN: 7 mL/min — AB (ref >60)
Glucose, Bld: 112 mg/dL — ABNORMAL HIGH (ref 65–99)
POTASSIUM: 3.1 mmol/L — AB (ref 3.5–5.1)
Phosphorus: 5 mg/dL — ABNORMAL HIGH (ref 2.5–4.6)
Sodium: 138 mmol/L (ref 135–145)

## 2016-04-23 LAB — PROTIME-INR
INR: 3.2
Prothrombin Time: 33.5 seconds — ABNORMAL HIGH (ref 11.4–15.2)

## 2016-04-23 LAB — CBC
HCT: 35.9 % — ABNORMAL LOW (ref 36.0–46.0)
Hemoglobin: 12.2 g/dL (ref 12.0–15.0)
MCH: 29.3 pg (ref 26.0–34.0)
MCHC: 34 g/dL (ref 30.0–36.0)
MCV: 86.3 fL (ref 78.0–100.0)
PLATELETS: 96 10*3/uL — AB (ref 150–400)
RBC: 4.16 MIL/uL (ref 3.87–5.11)
RDW: 16 % — AB (ref 11.5–15.5)
WBC: 8.8 10*3/uL (ref 4.0–10.5)

## 2016-04-23 LAB — HEPATITIS B SURFACE ANTIGEN: Hepatitis B Surface Ag: NEGATIVE

## 2016-04-23 LAB — GLUCOSE, CAPILLARY
GLUCOSE-CAPILLARY: 76 mg/dL (ref 65–99)
GLUCOSE-CAPILLARY: 81 mg/dL (ref 65–99)
Glucose-Capillary: 95 mg/dL (ref 65–99)
Glucose-Capillary: 95 mg/dL (ref 65–99)

## 2016-04-23 LAB — MAGNESIUM: MAGNESIUM: 1.9 mg/dL (ref 1.7–2.4)

## 2016-04-23 MED ORDER — RENA-VITE PO TABS
1.0000 | ORAL_TABLET | Freq: Every day | ORAL | Status: DC
  Administered 2016-04-23 – 2016-04-29 (×7): 1 via ORAL
  Filled 2016-04-23 (×8): qty 1

## 2016-04-23 MED ORDER — LIDOCAINE HCL (PF) 1 % IJ SOLN: 5.0000 mL | INTRAMUSCULAR | Status: DC | PRN

## 2016-04-23 MED ORDER — SENNOSIDES-DOCUSATE SODIUM 8.6-50 MG PO TABS: 2.0000 | ORAL_TABLET | Freq: Every evening | ORAL | Status: DC | PRN

## 2016-04-23 MED ORDER — HYDROMORPHONE HCL 1 MG/ML IJ SOLN
INTRAMUSCULAR | Status: AC
  Filled 2016-04-23: qty 1

## 2016-04-23 MED ORDER — ALTEPLASE 2 MG IJ SOLR: 2.0000 mg | Freq: Once | INTRAMUSCULAR | Status: DC | PRN

## 2016-04-23 MED ORDER — VITAMIN K1 10 MG/ML IJ SOLN
2.0000 mg | Freq: Once | INTRAVENOUS | Status: AC
  Administered 2016-04-23: 2 mg via INTRAVENOUS
  Filled 2016-04-23: qty 0.2

## 2016-04-23 MED ORDER — SODIUM CHLORIDE 0.9 % IV SOLN: 100.0000 mL | INTRAVENOUS | Status: DC | PRN

## 2016-04-23 MED ORDER — HEPARIN SODIUM (PORCINE) 1000 UNIT/ML DIALYSIS: 1000.0000 [IU] | INTRAMUSCULAR | Status: DC | PRN

## 2016-04-23 MED ORDER — CALCITRIOL 0.5 MCG PO CAPS
0.5000 ug | ORAL_CAPSULE | ORAL | Status: DC
  Administered 2016-04-25 – 2016-04-28 (×2): 0.5 ug via ORAL

## 2016-04-23 MED ORDER — LIDOCAINE-PRILOCAINE 2.5-2.5 % EX CREA: 1.0000 "application " | TOPICAL_CREAM | CUTANEOUS | Status: DC | PRN

## 2016-04-23 MED ORDER — POTASSIUM CHLORIDE CRYS ER 20 MEQ PO TBCR
20.0000 meq | EXTENDED_RELEASE_TABLET | Freq: Once | ORAL | Status: AC
  Administered 2016-04-23: 20 meq via ORAL
  Filled 2016-04-23: qty 1

## 2016-04-23 MED ORDER — FAMOTIDINE 20 MG PO TABS
20.0000 mg | ORAL_TABLET | Freq: Two times a day (BID) | ORAL | Status: DC | PRN
  Administered 2016-04-23: 20 mg via ORAL
  Filled 2016-04-23: qty 1

## 2016-04-23 MED ORDER — PENTAFLUOROPROP-TETRAFLUOROETH EX AERO: 1.0000 "application " | INHALATION_SPRAY | CUTANEOUS | Status: DC | PRN

## 2016-04-23 NOTE — Consult Note (Signed)
[facility] Renal Consultation Note    Indication for Consultation:  Management of ESRD/hemodialysis; anemia, hypertension/volume and secondary hyperparathyroidism  HPI: [name] is a 61 y.o. female on hemodialysis MWF at [facility]. PMH Dm, Hepatitis C ,CAD s/p CAB s/p AVR on Coumadin.  Admitted to [facility] 04/21/16 with severe foot pain. Pt has chronic PVD and non healing foot ulcer. Has been seen by VVS and planning L BKA sometime this week when INR <1.8.  Pt seen on HD c/o of pain in L foot. Last HD Monday 3/12. Patient is compliant with HD and has been meeting EDW. Patient does not appear volume overloaded. Biggest complaint is severe foot pain.   Past Medical History:  Diagnosis Date  . Anemia    takes Folic Acid daily  . Anxiety   . Aortic stenosis   . Arthritis    "left hand, back" (08/30/2012)  . Asthma   . CAD (coronary artery disease)   . CAD (coronary artery disease) Jan. 2015   Cath: 20% LAD, 50% D1; s/p LIMA-LAD  . Chest pain 03/06/2015  . Chronic back pain   . Chronic bronchitis ([place])    "q yr w/season changes" (08/30/2012)  . Chronic constipation    takes MIralax and Colace daily  . COPD (chronic obstructive pulmonary disease) ([place])   . Depression    takes Cymbalta for "severe" depression  . End stage renal disease on dialysis [facility]) 02/27/2011   Kidneys shut down at time of liver transplant in Sept 2011 at [facility] in [place], she has been on HD ever since.  Dialyzes at [facility] HD on TTS schedule.  Had L forearm graft used 10 months then removed Dec 2012 due to suspected infection.  A right upper arm AVG was placed Dec 2012 but she developed steal symptoms acutely and it was ligated the same day.  Never had an AV fistula due to small veins.  Now has L thigh AVG put in Jan 2013, has not clotted to date.    [name] GERD (gastroesophageal reflux disease)    takes Omeprazole daily  . Headache    "at least monthly" (08/30/2012)  . Hepatitis C   .  History of blood transfusion    "several" (08/30/2012)  . Hypertension    takes Metoprolol and Lisinopril daily, sees Dr Bea Graff  . Hypothyroidism    takes Synthroid daily  . Migraine    "last migraine was in 2013" (08/30/2012)  . Neuromuscular disorder ([place])    carpal tunnel in right hand  . Obesity   . Peripheral vascular disease (HCC) hands and legs  . Pneumonia    "today and several times before" (08/30/2012)  . 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 SVT (supraventricular tachycardia) ([place]) 06/09/14  . Tobacco abuse   . Type II diabetes mellitus (HCC)    Levemir 2units daily if > 150   Past Surgical History:  Procedure Laterality Date  . AORTIC VALVE REPLACEMENT N/A 03/15/2013   AVR; Surgeon: Ivin Poot, MD;  Location: [facility] OR; Open Heart Surgery;  36mm[employer] mechanical prosthesis, top hat valve  . ARTERIOVENOUS GRAFT PLACEMENT Left 10/03/10    forearm  . AV FISTULA PLACEMENT  01/29/2011   Procedure: INSERTION OF ARTERIOVENOUS (AV) GORE-TEX GRAFT ARM;  Surgeon: Elam Dutch, MD;  Location: [facility] OR;  Service: Vascular;  Laterality: Right;  . AV FISTULA PLACEMENT  03/10/2011   Procedure: INSERTION OF ARTERIOVENOUS (AV) GORE-TEX GRAFT THIGH;  Surgeon: Elam Dutch, MD;  Location: [place];  Service: Vascular;  Laterality: Left;  . [place] REMOVAL  12/23/2010   Procedure: REMOVAL OF ARTERIOVENOUS GORETEX GRAFT ([place]);  Surgeon: Elam Dutch, MD;  Location: [place];  Service: Vascular;  Laterality: Left;  procedure started @1736 -1852  . CHOLECYSTECTOMY  1993  . CORONARY ARTERY BYPASS GRAFT N/A 03/15/2013   Procedure: CORONARY ARTERY BYPASS GRAFTING (CABG) times one using left internal mammary artery.;  Surgeon: Ivin Poot, MD;  Location: [place];  Service: Open Heart Surgery;  Laterality: N/A;  POSS CABG X 1  . CYSTOSCOPY  1990's  . INSERTION  OF DIALYSIS CATHETER  12/23/2010   Procedure: INSERTION OF DIALYSIS CATHETER;  Surgeon: Elam Dutch, MD;  Location: [place];  Service: Vascular;  Laterality: Right;  Right Internal Jugular 28cm dialysis catheter insertion procedure time 1701-1720   . INSERTION OF DIALYSIS CATHETER Right 03/11/2016   Procedure: INSERTION OF DIALYSIS CATHETER;  Surgeon: Angelia Mould, MD;  Location: [place];  Service: Vascular;  Laterality: Right;  . INTRAOPERATIVE TRANSESOPHAGEAL ECHOCARDIOGRAM N/A 03/15/2013   Procedure: INTRAOPERATIVE TRANSESOPHAGEAL ECHOCARDIOGRAM;  Surgeon: Ivin Poot, MD;  Location: [place];  Service: Open Heart Surgery;  Laterality: N/A;  . LEFT HEART CATHETERIZATION WITH CORONARY ANGIOGRAM N/A 07/29/2012   Procedure: LEFT HEART CATHETERIZATION WITH CORONARY ANGIOGRAM;  Surgeon: Troy Sine, MD;  Location: [facility] CATH LAB;  Service: Cardiovascular;  Laterality: N/A;  . LEFT HEART CATHETERIZATION WITH CORONARY ANGIOGRAM N/A 03/10/2013   Procedure: LEFT HEART CATHETERIZATION WITH CORONARY ANGIOGRAM;  Surgeon: Troy Sine, MD;  Location: [facility] CATH LAB;  Service: Cardiovascular;  Laterality: N/A;  . LEFT HEART CATHETERIZATION WITH CORONARY/GRAFT ANGIOGRAM N/A 12/24/2011   Procedure: LEFT HEART CATHETERIZATION WITH Beatrix Fetters;  Surgeon: [name], MD;  Location: [facility] CATH LAB;  Service: Cardiovascular;  Laterality: N/A;  . LEFT HEART CATHETERIZATION WITH CORONARY/GRAFT ANGIOGRAM N/A 12/16/2013   Procedure: LEFT HEART CATHETERIZATION WITH Beatrix Fetters;  Surgeon: Troy Sine, MD;  Location: [facility] CATH LAB;  Service: Cardiovascular;  Laterality: N/A;  . LIGATION ARTERIOVENOUS GORTEX GRAFT Left 03/11/2016   Procedure: LIGATION THIGH ARTERIOVENOUS GORTEX GRAFT;  Surgeon: Angelia Mould, MD;  Location: [place];  Service: Vascular;  Laterality: Left;  . LIVER TRANSPLANT  10/25/2009   sees Dr Ferol Luz 1 every 6 months, saw last in Dec 2013. [name] Coord  [phone]  . PERIPHERAL VASCULAR CATHETERIZATION N/A 11/06/2015   Procedure: Abdominal Aortogram;  Surgeon: Serafina Mitchell, MD;  Location: [place] CV LAB;  Service: Cardiovascular;  Laterality: N/A;  . PERIPHERAL VASCULAR CATHETERIZATION N/A 11/06/2015   Procedure: Lower Extremity Angiography;  Surgeon: Serafina Mitchell, MD;  Location: [place] CV LAB;  Service: Cardiovascular;  Laterality: N/A;  . PERIPHERAL VASCULAR CATHETERIZATION  11/06/2015   Procedure: Peripheral Vascular Atherectomy;  Surgeon: Serafina Mitchell, MD;  Location: [place] CV LAB;  Service: Cardiovascular;;  Left Superficial femoral  . SHUNTOGRAM Left 05/15/2014   Procedure: SHUNTOGRAM;  Surgeon: Conrad [place], MD;  Location: [facility] CATH LAB;  Service: Cardiovascular;  Laterality: Left;  . SMALL INTESTINE SURGERY  90's  . SPINAL GROWTH RODS  2010   "put 2 metal rods in my back; they had detetriorated" (08/30/2012)  . THROMBECTOMY    . THROMBECTOMY AND REVISION OF ARTERIOVENTOUS (AV) GORETEX  GRAFT Left 03/30/2014  . THROMBECTOMY AND REVISION OF ARTERIOVENTOUS (AV) GORETEX  GRAFT Left 03/30/2014   Procedure: THROMBECTOMY AND REVISION OF ARTERIOVENTOUS (AV) GORETEX  GRAFT;  Surgeon: Conrad [place], MD;  Location: [place];  Service: Vascular;  Laterality: Left;  . TUBAL LIGATION  1990's   Family History  Problem Relation Age of Onset  . Cancer Mother   . Diabetes Mother   . Hypertension Mother   . Stroke Mother   . Cancer Father   . Anesthesia problems Neg Hx   . Hypotension Neg Hx   . Malignant hyperthermia Neg Hx   . Pseudochol deficiency Neg Hx    Social History:  reports that she has been smoking Cigarettes.  She has a 20.00 pack-year smoking history. She has never used smokeless tobacco. She reports that she does not drink alcohol or use drugs. Allergies  Allergen Reactions  . Acetaminophen Other (See Comments)    Liver transplant recipient   . Morphine Itching and Other (See Comments)    hallucinate  . Codeine  Itching  . Mirtazapine Other (See Comments)    hallucination   Prior to Admission medications   Medication Sig Start Date End Date Taking? Authorizing Provider  aspirin EC 81 MG tablet Take 81 mg by mouth daily.   Yes Historical Provider, MD  gabapentin (NEURONTIN) 300 MG capsule Take 300 mg by mouth every evening.   Yes Historical Provider, MD  HYDROmorphone (DILAUDID) 2 MG tablet Take 2 mg by mouth. 04/01/16  Yes Historical Provider, MD  Insulin Detemir (LEVEMIR FLEXPEN) 100 UNIT/ML Pen Inject 2-6 Units into the skin See admin instructions. Only if cbg is over 200 12/27/15  Yes Historical Provider, MD  levothyroxine (SYNTHROID, LEVOTHROID) 175 MCG tablet TAKE 1 TABLET (175 MCG TOTAL) BY MOUTH DAILY BEFORE BREAKFAST. 04/01/16  Yes Historical Provider, MD  metoprolol tartrate (LOPRESSOR) 25 MG tablet Take 25 mg by mouth 2 (two) times daily. 03/16/14  Yes Historical Provider, MD  nitroGLYCERIN (NITROSTAT) 0.4 MG SL tablet Place 1 tablet (0.4 mg total) under the tongue every 5 (five) minutes as needed for chest pain. 03/20/14  Yes Silver Huguenin Elgergawy, MD  ondansetron (ZOFRAN ODT) 4 MG disintegrating tablet Take 1 tablet (4 mg total) by mouth every 8 (eight) hours as needed for nausea. 08/13/15  Yes Tanna Furry, MD  senna-docusate (SENOKOT-S) 8.6-50 MG tablet Take 1 tablet by mouth daily.   Yes Historical Provider, MD  tacrolimus (PROGRAF) 1 MG capsule Take 2 capsules (2 mg total) by mouth 2 (two) times daily. Take 2 capsules twice daily. 11/29/15  Yes Maryann Mikhail, DO  calcium acetate (PHOSLO) 667 MG capsule Take 1 capsule (667 mg total) by mouth 3 (three) times daily with meals. Patient not taking: Reported on 04/23/2016 10/16/15   Orson Eva, MD  gabapentin (NEURONTIN) 100 MG capsule Take 100 mg by mouth as directed. 1 capsule on dialysis days    Historical Provider, MD  warfarin (COUMADIN) 2 MG tablet Take 1- 2 tablets by mouth daily or as directed by coumadin clinic Patient taking differently: Take 2 mg  by mouth daily at 6 PM. Take as directed by coumadin clinic 11/01/15   Troy Sine, MD   Current Facility-Administered Medications  Medication Dose Route Frequency Provider Last Rate Last Dose  . aspirin EC tablet 81 mg  81 mg Oral Daily Edwin Dada, MD   81 mg at 04/23/16 1042  . calcium acetate (PHOSLO) capsule 667 mg  667 mg Oral  TID WC Edwin Dada, MD   667 mg at 04/23/16 1305  . gabapentin (NEURONTIN) capsule 100 mg  100 mg Oral Q M,W,F-HD Edwin Dada, MD      . gabapentin (NEURONTIN) capsule 300 mg  300 mg Oral QHS Edwin Dada, MD   300 mg at 04/22/16 2047  . hydrALAZINE (APRESOLINE) injection 10-20 mg  10-20 mg Intravenous Q2H PRN Patrecia Pour, MD   10 mg at 04/23/16 0426  . HYDROmorphone (DILAUDID) injection 1 mg  1 mg Intravenous Q4H PRN Edwin Dada, MD   1 mg at 04/23/16 0817  . insulin aspart (novoLOG) injection 0-5 Units  0-5 Units Subcutaneous QHS Edwin Dada, MD      . insulin aspart (novoLOG) injection 0-9 Units  0-9 Units Subcutaneous TID WC Edwin Dada, MD      . levothyroxine (SYNTHROID, LEVOTHROID) tablet 175 mcg  175 mcg Oral QAC breakfast Edwin Dada, MD   175 mcg at 04/23/16 0816  . metoprolol tartrate (LOPRESSOR) tablet 25 mg  25 mg Oral BID Edwin Dada, MD   25 mg at 04/23/16 1043  . ondansetron (ZOFRAN) tablet 4 mg  4 mg Oral Q6H PRN Edwin Dada, MD       Or  . ondansetron (ZOFRAN) injection 4 mg  4 mg Intravenous Q6H PRN Edwin Dada, MD   4 mg at 04/21/16 2213  . oxyCODONE (Oxy IR/ROXICODONE) immediate release tablet 5 mg  5 mg Oral Q4H PRN Edwin Dada, MD   5 mg at 04/23/16 1042  . polyethylene glycol (MIRALAX / GLYCOLAX) packet 17 g  17 g Oral Daily PRN Gardiner Barefoot, NP   17 g at 04/23/16 1215  . senna-docusate (Senokot-S) tablet 2 tablet  2 tablet Oral QHS Gardiner Barefoot, NP      . tacrolimus (PROGRAF) capsule 2 mg  2 mg Oral BID  Edwin Dada, MD   2 mg at 04/23/16 1043    Denies fever, chills, dizziness, night sweats,  Denies Ha, visual changes Denies chest pain, palpitations, dyspnea on exertion, orthopnea, edema, last EKG/Echo Denies SOB, cough, hemoptysis (h/o COPD, bronchitis, OSA), last chest xray  ROS: As per HPI otherwise negative.  Physical Exam: Vitals:   04/22/16 2047 04/23/16 0421 04/23/16 0507 04/23/16 0908  BP: (!) 177/65 (!) 191/69 (!) 159/60 (!) 158/48  Pulse: 70 72 72 72  Resp: 16 17  16   Temp: 99.2 F (37.3 C) 98.6 F (37 C)  98.2 F (36.8 C)  TempSrc:    Oral  SpO2: 99% 95%  96%  Weight: 58 kg (127 lb 13.9 oz)     Height:         General: WDWN NAD Head: NCAT sclera not icteric MMM Neck: Supple. No JVD No masses Lungs: CTA bilaterally without wheezes, rales, or rhonchi. Breathing is unlabored. Heart: RRR with S1 S2 Abdomen: soft NT + BS Lower extremities:without edema or ischemic changes, no open wounds  Neuro: A & O  X 3. Moves all extremities spontaneously. Psych:  Responds to questions appropriately with a normal affect. Dialysis Access:  Labs: Basic Metabolic Panel:  Recent Labs Lab 04/21/16 2157  NA 137  K 3.5  CL 97*  CO2 29  GLUCOSE 99  BUN 5*  CREATININE 3.51*  CALCIUM 8.3*   Liver Function Tests:  Recent Labs Lab 04/21/16 2157  AST 60*  ALT 14  ALKPHOS 226*  BILITOT 1.5*  PROT 7.5  ALBUMIN 2.3*   No results for input(s): LIPASE, AMYLASE in the last 168 hours. No results for input(s): AMMONIA in the last 168 hours. CBC:  Recent Labs Lab 04/21/16 2157 04/22/16 0645 04/23/16 0640  WBC 8.7 7.2 8.8  NEUTROABS 3.6  --   --   HGB 13.8 11.9* 12.2  HCT 41.4 36.0 35.9*  MCV 88.7 89.1 86.3  PLT 133* 85* 96*   Cardiac Enzymes: No results for input(s): CKTOTAL, CKMB, CKMBINDEX, TROPONINI in the last 168 hours. CBG:  Recent Labs Lab 04/22/16 1146 04/22/16 1659 04/22/16 2046 04/23/16 0744 04/23/16 1137  GLUCAP 85 94 100* 76 95    Iron Studies: No results for input(s): IRON, TIBC, TRANSFERRIN, FERRITIN in the last 72 hours. Studies/Results: Nm Myocar Multi W/spect W/wall Motion / Ef  Result Date: 04/22/2016 CLINICAL DATA:  Preop foot fracture. EXAM: MYOCARDIAL IMAGING WITH SPECT (REST AND PHARMACOLOGIC-STRESS) GATED LEFT VENTRICULAR WALL MOTION STUDY LEFT VENTRICULAR EJECTION FRACTION TECHNIQUE: Standard myocardial SPECT imaging was performed after resting intravenous injection of 10 mCi Tc-4m tetrofosmin. Subsequently, intravenous infusion of Lexiscan was performed under the supervision of the Cardiology staff. At peak effect of the drug, 30 mCi Tc-60m tetrofosmin was injected intravenously and standard myocardial SPECT imaging was performed. Quantitative gated imaging was also performed to evaluate left ventricular wall motion, and estimate left ventricular ejection fraction. COMPARISON:  None. FINDINGS: Perfusion: There is a fixed defects noted in the lateral wall extending into the anterior and inferior walls compatible with old infarct. No significant reversibility to suggest ischemia. Wall Motion: Mild generalized hypokinesia S, slightly more pronounced in the area of scar Left Ventricular Ejection Fraction: 46 % End diastolic volume 540 ml End systolic volume 79 ml IMPRESSION: 1. 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 No ischemia. 2. Generalized hypokinesia, slightly more pronounced in the area of scar. 3. Left ventricular ejection fraction 46% 4. Non invasive risk stratification*: In hernia *2012 Appropriate Use Criteria for Coronary Revascularization Focused Update: J Am Coll Cardiol. 0867;61(9):509-326. http://content.airportbarriers.com.aspx?articleid=[email or web address] Electronically Signed   By: Rolm Baptise M.D.   On: 04/22/2016 15:54    Dialysis Orders:  [place] MWF EDW 58kg  Time 3hrs 15min 2/2 bath   No heparin  Calcitriol 0.80mcg PO q HD Not currently on ESA - Mircera 159mcg  IV last dosed 2/228  OP Labs: hgb 11.8 Tsat 32% P 5.3 PTH 573   Assessment/Plan: 1.  PVD/L foot gangrene - per primary VVS consulted for L BKA when INR <1.8 Cards consulted for CV clearance  2.  ESRD -  MWF  3.  Hypertension/volume  - BP slightly elevated/ No gross edema by exam / UF goal 1-2L today  4.  Anemia  - Hgb 12.2 follow with surgery not on esa  5.  Metabolic bone disease -  Cont VDRA/binders Ca acetate binder  6.  Nutrition - Renal diet/vitamins  7. S/P AVR on coumadin - INR 3.2 getting vitamin K    [name] PA-C [place] Pager [phone] 04/23/2016, 1:31 PM   I have seen and examined this patient and agree with plan and assessment in the above note with renal recommendations/intervention highlighted.  Plan for left BKA once INR in acceptable range and cardiology clears for surgery.  Continue with HD qMWF while sh remains an inpatient.  Somewhat tearful about the pending amputation but understands the necessity.   Broadus John A Avelina Mcclurkin,MD 04/23/2016 2:12 PM

## 2016-04-23 NOTE — Consult Note (Signed)
   [facility] Inpatient Consult   04/23/2016  CLARABEL MARION 1955-11-08 [id number]   Patient screened for [place] Management program services. Chart reviewed. Noted Primary Care MD not listed. Went to beside to confirm Primary Care Provider. However, Ms. Sliter was off the unit. Also noted that Ms. Nault is to have a left BKA. Will follow up at later time.   [name], MSN-Ed, RN,BSN [facility] Liaison [phone]

## 2016-04-23 NOTE — Procedures (Signed)
I was present at this dialysis session. I have reviewed the session itself and made appropriate changes.   Filed Weights   04/21/16 2200 04/22/16 2047  Weight: 58.7 kg (129 lb 6.6 oz) 58 kg (127 lb 13.9 oz)     Recent Labs Lab 04/21/16 2157  NA 137  K 3.5  CL 97*  CO2 29  GLUCOSE 99  BUN 5*  CREATININE 3.51*  CALCIUM 8.3*     Recent Labs Lab 04/21/16 2157 04/22/16 0645 04/23/16 0640  WBC 8.7 7.2 8.8  NEUTROABS 3.6  --   --   HGB 13.8 11.9* 12.2  HCT 41.4 36.0 35.9*  MCV 88.7 89.1 86.3  PLT 133* 85* 96*    Scheduled Meds: . aspirin EC  81 mg Oral Daily  . calcium acetate  667 mg Oral TID WC  . gabapentin  100 mg Oral Q M,W,F-HD  . gabapentin  300 mg Oral QHS  . insulin aspart  0-5 Units Subcutaneous QHS  . insulin aspart  0-9 Units Subcutaneous TID WC  . levothyroxine  175 mcg Oral QAC breakfast  . metoprolol tartrate  25 mg Oral BID  . senna-docusate  2 tablet Oral QHS  . tacrolimus  2 mg Oral BID   Continuous Infusions: PRN Meds:.hydrALAZINE, HYDROmorphone (DILAUDID) injection, ondansetron **OR** ondansetron (ZOFRAN) IV, oxyCODONE, polyethylene glycol   Donetta Potts,  MD 04/23/2016, 2:08 PM

## 2016-04-23 NOTE — Consult Note (Signed)
Cardiology Consult    Patient ID: [name] MRN: [id number], DOB/AGE: [birth year]   Admit date: 04/21/2016 Date of Consult: 04/23/2016  Primary Physician: No primary care provider on file. Reason for Consult: Pre-operative evaluation Primary Cardiologist: Dr. Shelva Majestic Requesting Provider: Dr. Gae Gallop  History of Present Illness    [name] is a 61 y.o. with past medical history significant for HTN, PVD, ESRD on hemodialysis, liver transplant 2011, AVR with mechanical valve (on coumadin) and single vessel CABG 2015, COPD, Type II DM, Hep C, and SVT.  She has a left chronic heel wound, scheduled for left BKA on 04/29/2016, but her pain became intolerable. Her most recent intervention, 10/2015, was an angiogram with Atherectomy, left superficial femoral artery and Drug coated balloon angioplasty, left superficial femoral artery.  She has been on Neurontin to try and help with her neuropathic pain control which is not helping. Her atherosclerotic risk factors include continued active smoking for 40 years, DM, CAD, and ESRD.  She is on coumadin therapy secondary to a mechanical valve replacement and prior liver transplant.  Her most recent echo on 03/08/2016 showed EF of 40-45% (down from EF 55-60% 11/2015), moderate concentric hypertrophy, mild hypokimesis fo the anteroseptal region, decreased LV diastolic compliance and/or increased left atrial pressure, presence of mechanical AV with mild AR, mild-mod MR. PASP 52 mmHg.  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 No ischemia. Generalized hypokinesia, slightly more pronounced in the area of Scar. Left ventricular ejection fraction 46%     Cardiac cath 12/2013 showed Patent LIMA to LAD, 20% stenosis left circ, RCA free of significant dz.   Past Medical History   Past Medical History:  Diagnosis Date  . Anemia    takes Folic Acid  daily  . Anxiety   . Aortic stenosis   . Arthritis    "left hand, back" (08/30/2012)  . Asthma   . CAD (coronary artery disease)   . CAD (coronary artery disease) Jan. 2015   Cath: 20% LAD, 50% D1; s/p LIMA-LAD  . Chest pain 03/06/2015  . Chronic back pain   . Chronic bronchitis ([place])    "q yr w/season changes" (08/30/2012)  . Chronic constipation    takes MIralax and Colace daily  . COPD (chronic obstructive pulmonary disease) ([place])   . Depression    takes Cymbalta for "severe" depression  . End stage renal disease on dialysis [facility]) 02/27/2011   Kidneys shut down at time of liver transplant in Sept 2011 at [facility] in [place], she has been on HD ever since.  Dialyzes at [facility] HD on TTS schedule.  Had L forearm graft used 10 months then removed Dec 2012 due to suspected infection.  A right upper arm AVG was placed Dec 2012 but she developed steal symptoms acutely and it was ligated the same day.  Never had an AV fistula due to small veins.  Now has L thigh AVG put in Jan 2013, has not clotted to date.    [name] GERD (gastroesophageal reflux disease)    takes Omeprazole daily  . Headache    "at least monthly" (08/30/2012)  . Hepatitis C   . History of blood transfusion    "several" (08/30/2012)  . Hypertension    takes Metoprolol and Lisinopril daily, sees Dr Bea Graff  . Hypothyroidism    takes Synthroid daily  . Migraine    "last migraine was in  2013" (08/30/2012)  . Neuromuscular disorder ([place])    carpal tunnel in right hand  . Obesity   . Peripheral vascular disease (HCC) hands and legs  . Pneumonia    "today and several times before" (08/30/2012)  . 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 SVT (supraventricular tachycardia) ([place]) 06/09/14  . Tobacco abuse   . Type II diabetes mellitus (HCC)    Levemir 2units daily if > 150    Past Surgical  History:  Procedure Laterality Date  . AORTIC VALVE REPLACEMENT N/A 03/15/2013   AVR; Surgeon: Ivin Poot, MD;  Location: [facility] OR; Open Heart Surgery;  22mm[employer] mechanical prosthesis, top hat valve  . ARTERIOVENOUS GRAFT PLACEMENT Left 10/03/10    forearm  . AV FISTULA PLACEMENT  01/29/2011   Procedure: INSERTION OF ARTERIOVENOUS (AV) GORE-TEX GRAFT ARM;  Surgeon: Elam Dutch, MD;  Location: [facility] OR;  Service: Vascular;  Laterality: Right;  . AV FISTULA PLACEMENT  03/10/2011   Procedure: INSERTION OF ARTERIOVENOUS (AV) GORE-TEX GRAFT THIGH;  Surgeon: Elam Dutch, MD;  Location: [place];  Service: Vascular;  Laterality: Left;  . [place] REMOVAL  12/23/2010   Procedure: REMOVAL OF ARTERIOVENOUS GORETEX GRAFT ([place]);  Surgeon: Elam Dutch, MD;  Location: [place];  Service: Vascular;  Laterality: Left;  procedure started @1736 -7782  . CHOLECYSTECTOMY  1993  . CORONARY ARTERY BYPASS GRAFT N/A 03/15/2013   Procedure: CORONARY ARTERY BYPASS GRAFTING (CABG) times one using left internal mammary artery.;  Surgeon: Ivin Poot, MD;  Location: [place];  Service: Open Heart Surgery;  Laterality: N/A;  POSS CABG X 1  . CYSTOSCOPY  1990's  . INSERTION OF DIALYSIS CATHETER  12/23/2010   Procedure: INSERTION OF DIALYSIS CATHETER;  Surgeon: Elam Dutch, MD;  Location: [place];  Service: Vascular;  Laterality: Right;  Right Internal Jugular 28cm dialysis catheter insertion procedure time 1701-1720   . INSERTION OF DIALYSIS CATHETER Right 03/11/2016   Procedure: INSERTION OF DIALYSIS CATHETER;  Surgeon: Angelia Mould, MD;  Location: [place];  Service: Vascular;  Laterality: Right;  . INTRAOPERATIVE TRANSESOPHAGEAL ECHOCARDIOGRAM N/A 03/15/2013   Procedure: INTRAOPERATIVE TRANSESOPHAGEAL ECHOCARDIOGRAM;  Surgeon: Ivin Poot, MD;  Location: [place];  Service: Open Heart Surgery;  Laterality: N/A;  . LEFT HEART CATHETERIZATION WITH CORONARY ANGIOGRAM N/A 07/29/2012   Procedure: LEFT HEART  CATHETERIZATION WITH CORONARY ANGIOGRAM;  Surgeon: Troy Sine, MD;  Location: [facility] CATH LAB;  Service: Cardiovascular;  Laterality: N/A;  . LEFT HEART CATHETERIZATION WITH CORONARY ANGIOGRAM N/A 03/10/2013   Procedure: LEFT HEART CATHETERIZATION WITH CORONARY ANGIOGRAM;  Surgeon: Troy Sine, MD;  Location: [facility] CATH LAB;  Service: Cardiovascular;  Laterality: N/A;  . LEFT HEART CATHETERIZATION WITH CORONARY/GRAFT ANGIOGRAM N/A 12/24/2011   Procedure: LEFT HEART CATHETERIZATION WITH Beatrix Fetters;  Surgeon: [name], MD;  Location: [facility] CATH LAB;  Service: Cardiovascular;  Laterality: N/A;  . LEFT HEART CATHETERIZATION WITH CORONARY/GRAFT ANGIOGRAM N/A 12/16/2013   Procedure: LEFT HEART CATHETERIZATION WITH Beatrix Fetters;  Surgeon: Troy Sine, MD;  Location: [facility] CATH LAB;  Service: Cardiovascular;  Laterality: N/A;  . LIGATION ARTERIOVENOUS GORTEX GRAFT Left 03/11/2016   Procedure: LIGATION THIGH ARTERIOVENOUS GORTEX GRAFT;  Surgeon: Angelia Mould, MD;  Location: [place];  Service: Vascular;  Laterality: Left;  . LIVER TRANSPLANT  10/25/2009   sees Dr Ferol Luz 1 every 6 months, saw last in Dec 2013. [name] Coord [phone]  . PERIPHERAL VASCULAR CATHETERIZATION N/A 11/06/2015   Procedure: Abdominal Aortogram;  Surgeon: Serafina Mitchell, MD;  Location: [place] CV LAB;  Service: Cardiovascular;  Laterality: N/A;  . PERIPHERAL VASCULAR CATHETERIZATION N/A 11/06/2015   Procedure: Lower Extremity Angiography;  Surgeon: Serafina Mitchell, MD;  Location: [place] CV LAB;  Service: Cardiovascular;  Laterality: N/A;  . PERIPHERAL VASCULAR CATHETERIZATION  11/06/2015   Procedure: Peripheral Vascular Atherectomy;  Surgeon: Serafina Mitchell, MD;  Location: [place] CV LAB;  Service: Cardiovascular;;  Left Superficial femoral  . SHUNTOGRAM Left 05/15/2014   Procedure: SHUNTOGRAM;  Surgeon: Conrad Cont[place], MD;  Location: [facility] CATH LAB;  Service: Cardiovascular;   Laterality: Left;  . SMALL INTESTINE SURGERY  90's  . SPINAL GROWTH RODS  2010   "put 2 metal rods in my back; they had detetriorated" (08/30/2012)  . THROMBECTOMY    . THROMBECTOMY AND REVISION OF ARTERIOVENTOUS (AV) GORETEX  GRAFT Left 03/30/2014  . THROMBECTOMY AND REVISION OF ARTERIOVENTOUS (AV) GORETEX  GRAFT Left 03/30/2014   Procedure: THROMBECTOMY AND REVISION OF ARTERIOVENTOUS (AV) GORETEX  GRAFT;  Surgeon: Conrad [place], MD;  Location: [place];  Service: Vascular;  Laterality: Left;  . TUBAL LIGATION  1990's     Allergies  Allergies  Allergen Reactions  . Acetaminophen Other (See Comments)    Liver transplant recipient   . Codeine Itching  . Mirtazapine Other (See Comments)    hallucination  . Morphine Itching and Other (See Comments)    hallucinate    Inpatient Medications    . aspirin EC  81 mg Oral Daily  . calcium acetate  667 mg Oral TID WC  . gabapentin  100 mg Oral Q M,W,F-HD  . gabapentin  300 mg Oral QHS  . insulin aspart  0-5 Units Subcutaneous QHS  . insulin aspart  0-9 Units Subcutaneous TID WC  . levothyroxine  175 mcg Oral QAC breakfast  . metoprolol tartrate  25 mg Oral BID  . phytonadione (VITAMIN K) IV  2 mg Intravenous Once  . senna-docusate  2 tablet Oral QHS  . tacrolimus  2 mg Oral BID    Family History    Family History  Problem Relation Age of Onset  . Cancer Mother   . Diabetes Mother   . Hypertension Mother   . Stroke Mother   . Cancer Father   . Anesthesia problems Neg Hx   . Hypotension Neg Hx   . Malignant hyperthermia Neg Hx   . Pseudochol deficiency Neg Hx     Social History    Social History   Social History  . Marital status: Divorced    Spouse name: N/A  . Number of children: N/A  . Years of education: N/A   Occupational History  . Not on file.   Social History Main Topics  . Smoking status: Current Every Day Smoker    Packs/day: 0.50    Years: 40.00    Types: Cigarettes  . Smokeless tobacco: Never Used      Comment: Almost 1 pk per day  . Alcohol use No  . Drug use: No  . Sexual activity: No   Other Topics Concern  . Not on file   Social History Narrative  . No narrative on file     Review of Systems    General:  No chills, fever, night sweats  or weight changes.  Cardiovascular:  Chest pain as she is more and more apprehensive about impending surgery, Chronic dyspnea on exertion, No edema, orthopnea, palpitations, paroxysmal nocturnal dyspnea. Dermatological: No rash, lesions/masses Respiratory: No cough, dyspnea Urologic: No hematuria, dysuria Abdominal:   She complains of mild abdominal tenderness and no BM in 2 days Neurologic:  No visual changes, wkns, changes in mental status. All other systems reviewed and are otherwise negative except as noted above.  Physical Exam    Blood pressure (!) 158/48, pulse 72, temperature 98.2 F (36.8 C), temperature source Oral, resp. rate 16, height 5' 4.5" (1.638 m), weight 127 lb 13.9 oz (58 kg), SpO2 96 %.  General: Pleasant, NAD Psych: Normal affect. Neuro: Alert and oriented X 3. Moves all extremities spontaneously. HEENT: Normal  Neck: Unable to assess JVD due to tunneled dialysis access Lungs:  Resp regular and unlabored, CTA. Lung sounds diminished in the bases Heart: RRR no s3, s4, or murmurs. Mechanical click Abdomen: Soft, Mild tenderness, non-distended, BS + x 4.  Extremities: No edema. Left foot with dry black toes, wrapped in Kerlex  Labs    Troponin (Point of Care Test) No results for input(s): TROPIPOC in the last 72 hours. No results for input(s): CKTOTAL, CKMB, TROPONINI in the last 72 hours. Lab Results  Component Value Date   WBC 8.8 04/23/2016   HGB 12.2 04/23/2016   HCT 35.9 (L) 04/23/2016   MCV 86.3 04/23/2016   PLT 96 (L) 04/23/2016    Recent Labs Lab 04/21/16 2157  NA 137  K 3.5  CL 97*  CO2 29  BUN 5*  CREATININE 3.51*  CALCIUM 8.3*  PROT 7.5  BILITOT 1.5*  ALKPHOS 226*  ALT 14  AST 60*    GLUCOSE 99   Lab Results  Component Value Date   CHOL 225 (H) 12/22/2011   HDL 97 12/22/2011   LDLCALC 104 (H) 12/22/2011   TRIG 121 12/22/2011   Lab Results  Component Value Date   DDIMER 0.40 12/16/2013     Radiology Studies    Nm Myocar Multi W/spect W/wall Motion / Ef  Result Date: 04/22/2016 CLINICAL DATA:  Preop foot fracture. EXAM: MYOCARDIAL IMAGING WITH SPECT (REST AND PHARMACOLOGIC-STRESS) GATED LEFT VENTRICULAR WALL MOTION STUDY LEFT VENTRICULAR EJECTION FRACTION TECHNIQUE: Standard myocardial SPECT imaging was performed after resting intravenous injection of 10 mCi Tc-56m tetrofosmin. Subsequently, intravenous infusion of Lexiscan was performed under the supervision of the Cardiology staff. At peak effect of the drug, 30 mCi Tc-23m tetrofosmin was injected intravenously and standard myocardial SPECT imaging was performed. Quantitative gated imaging was also performed to evaluate left ventricular wall motion, and estimate left ventricular ejection fraction. COMPARISON:  None. FINDINGS: Perfusion: There is a fixed defects noted in the lateral wall extending into the anterior and inferior walls compatible with old infarct. No significant reversibility to suggest ischemia. Wall Motion: Mild generalized hypokinesia S, slightly more pronounced in the area of scar Left Ventricular Ejection Fraction: 46 % End diastolic volume 448 ml End systolic volume 79 ml IMPRESSION: 1. 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 No ischemia. 2. Generalized hypokinesia, slightly more pronounced in the area of scar. 3. Left ventricular ejection fraction 46% 4. Non invasive risk stratification*: In hernia *2012 Appropriate Use Criteria for Coronary Revascularization Focused Update: J Am Coll Cardiol. 1856;31(4):970-263. http://content.airportbarriers.com.aspx?articleid=[email or web address] Electronically Signed   By: Rolm Baptise M.D.   On: 04/22/2016 15:54    EKG & Cardiac  Imaging  EKG: NSR 74 bpm.  Q waves in V1-V2 (present previously), non-specific T wave changes laterally, no significant changes  Echocardiogram:  03/08/2016 Study Conclusions  - Left ventricle: The cavity size was normal. There was moderate   concentric hypertrophy. Systolic function was mildly reduced. The   estimated ejection fraction was in the range of 40% to 45%. Mild   hypokinesis of the anteroseptal myocardium. Doppler parameters   are consistent with a reversible restrictive pattern, indicative   of decreased left ventricular diastolic compliance and/or   increased left atrial pressure (grade 3 diastolic dysfunction). - Aortic valve: A [employer]. [employer] mechanical prosthesis was   present. There was mild regurgitation. - Mitral valve: There was mild to moderate regurgitation. - Left atrium: The atrium was moderately dilated. - Tricuspid valve: There was mild-moderate regurgitation. - Pulmonary arteries: Systolic pressure was moderately increased.   PA peak pressure: 52 mm Hg (S).  Lexiscan Myoview  04/22/2016 1. 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 No ischemia.  2. Generalized hypokinesia, slightly more pronounced in the area of scar.  3. Left ventricular ejection fraction 46%  4. Non invasive risk stratification*: In hernia  Assessment & Plan    1. CAD -S/P CABG x 1 03/2013 -Cardiac cath 12/2013 showed Patent LIMA to LAD, 20% stenosis left circ, RCA free of significant dz. -Lexiscan Myoview showed No significant reversibility to suggest ischemia, low risk study -No new EKG changes  2. S/P AVR St Jude mechanical valve -Long term anticoagulation with coumadin. INR 3.2. Small dose Vit K given per vascular surgery. -Echo shows mild AR  3. Mild LV dysfunction -EF 40-45% in 02/2016 -Appears euvolemic  4. ESRD -Hemodialysis per nephrology  5. Paroxysmal SVT -Treated with beta blocker and no recent known  episodes  6. Critical lower limb ischemia -Per vascular surgery: Left chronic heel wound, scheduled for left BKA on 04/29/2016, but her pain became intolerable. She is planned for Left BKA when INR < 1.8, hopefully tomorrow.  Cardiac evaluation for pre-surgery: Pt is low cardiac risk for this low risk procedure once her INR is normalized.   Tildon Husky, NP-C 04/23/2016, 9:46 AM Pager: [phone]  Agree with note by Daune Perch NP-C  We are asked to see [name] by her primary service for preoperative clearance before BKA. [name] is a 61 year old thin and frail-appearing African-American female patient of Dr. Lenise Arena and problems who has ischemic cardiomyopathy with an EF in the 45% range status post bypass grafting 1 and St. Jude AVR in 2015. Catheterization since that time revealed a patent LIMA to LAD with critical disease otherwise. She does have end-stage renal disease on hemodialysis and critical limb ischemia status post endovascular therapy of her left SFA for Dr. Trula Slade 9/17. Unfortunately, her wound never healed and now she has gangrene with chronic pain. We are asked to see her for preoperative clearance. She did have a Myoview performed yesterday that was low risk with anterolateral scar and no ischemia. Her INR is elevated at greater than 3 and she did receive a small dose of vitamin K. Her exam otherwise is benign. I think she is at low risk to have her BKA once her INR is 1.7 or below. Can restart Coumadin anticoagulation after that. We will follow her postop period  [name], M.D., Garret Reddish, FAHA, [place] [address] [place], [place]  [address]  [phone] 04/23/2016 11:32 AM

## 2016-04-23 NOTE — Progress Notes (Signed)
Pt is MWF dialysis patient, no nephrology consult.

## 2016-04-23 NOTE — Progress Notes (Signed)
[name] for Warfarin> Heparin  Indication:  St Jude Mechanical valve- bridging for procedure  Allergies  Allergen Reactions  . Acetaminophen Other (See Comments)    Liver transplant recipient   . Codeine Itching  . Mirtazapine Other (See Comments)    hallucination  . Morphine Itching and Other (See Comments)    hallucinate    Patient Measurements: Height: 5' 4.5" (163.8 cm) (from 04/17/16) Weight: 127 lb 13.9 oz (58 kg) IBW/kg (Calculated) : 55.85 Heparin Dosing Weight: 58.7 kg  Vital Signs: Temp: 98.6 F (37 C) (03/14 0421) BP: 159/60 (03/14 0507) Pulse Rate: 72 (03/14 0507)  Labs:  Recent Labs  04/21/16 2157 04/22/16 0645 04/22/16 1911 04/23/16 0640  HGB 13.8 11.9*  --  12.2  HCT 41.4 36.0  --  35.9*  PLT 133* 85*  --  96*  LABPROT 29.7* 32.8* 32.2* 33.5*  INR 2.76 3.12 3.05 3.20  CREATININE 3.51*  --   --   --     Estimated Creatinine Clearance: 14.9 mL/min (by C-G formula based on SCr of 3.51 mg/dL (H)).   Assessment: 61 y.o female with ESRD on HD-MWF presented [facility] ED 04/21/16 with chief complaint of left  foot pain. The patient has had worsening of her chronic left PVD and left heel ulcer and she is scheduled for elective L BKA on 04/29/16.   She is on Coumadin PTA for h/o St. Jude mechanical valve- she is to bridge with heparin for procedure- to start when INR </= 2.5.   PTA Coumadin dose is 2mg  daily- last taken pta on 3/12.  INR remains elevated this morning at 3.2. Getting vitamin K 2mg  IV x1 per vascular orders. May need FFP tomorrow if surgery is done-  per vascular, plan is for BKA when INR is <2.   Hgb 12.2, plts have fallen from admission (133>85>96)- no bleeding noted, continue to watch.  Goal of Therapy:  Heparin level 0.3-0.7 units/ml INR = 2.5-3.5 (per outpt anticoag office note on 04/17/16 [facility]) Monitor platelets by anticoagulation protocol: Yes   Plan:  INR at 2000 tonight to monitor  effects of vitamin K dose Start IV heparin when INR =/<2.5 Daily INR and CBC  [name] D. Nicko Daher, PharmD, BCPS Clinical Pharmacist Pager: [phone] 04/23/2016 8:48 AM

## 2016-04-23 NOTE — Progress Notes (Signed)
Pt gone to dialysis via bed.   

## 2016-04-23 NOTE — Progress Notes (Signed)
Patient c/o indigestion and also req for med to help her move her bowels. MD on call notified via text page. Order received. Briza Bark, Wonda Cheng, Therapist, sports

## 2016-04-23 NOTE — Progress Notes (Signed)
   VASCULAR SURGERY ASSESSMENT & PLAN:  INR is even higher today. I have given a small dose of vitamin K (2 mg IV). If we are going to proceed with a left BKA tomorrow, she may need to receive FFP today.   Please try to dialyze today so that it does not interfere with her planned surgery tomorrow.  SUBJECTIVE: Resting comfortably.  PHYSICAL EXAM: Vitals:   04/22/16 1703 04/22/16 2047 04/23/16 0421 04/23/16 0507  BP: (!) 179/61 (!) 177/65 (!) 191/69 (!) 159/60  Pulse: 73 70 72 72  Resp: 18 16 17    Temp: 98.3 F (36.8 C) 99.2 F (37.3 C) 98.6 F (37 C)   TempSrc: Oral     SpO2: 95% 99% 95%   Weight:  127 lb 13.9 oz (58 kg)    Height:       No change in exam.  LABS: Lab Results  Component Value Date   WBC 8.8 04/23/2016   HGB 12.2 04/23/2016   HCT 35.9 (L) 04/23/2016   MCV 86.3 04/23/2016   PLT 96 (L) 04/23/2016   Lab Results  Component Value Date   CREATININE 3.51 (H) 04/21/2016   Lab Results  Component Value Date   INR 3.20 04/23/2016   CBG (last 3)   Recent Labs  04/22/16 1659 04/22/16 2046 04/23/16 0744  GLUCAP 94 100* 76    Principal Problem:   Claudication of left lower extremity (HCC) Active Problems:   Hypothyroidism   Type 2 diabetes mellitus with chronic kidney disease (HCC)   Anemia due to chronic renal failure    End stage renal disease on dialysis (HCC)   CAD (coronary artery disease), native coronary artery   History of prosthetic aortic valve replacement   Chronic combined systolic and diastolic congestive heart failure (HCC)   COPD (chronic obstructive pulmonary disease) (HCC)   Chronic anticoagulation w/ coumadin, goal INR 2.5-3.0 w/ AVR   S/P liver transplant ([place])   Foot pain   Critical lower limb ischemia   Gae Gallop Beeper: [phone] 04/23/2016

## 2016-04-23 NOTE — Progress Notes (Signed)
PROGRESS NOTE  [name]  ZES:[id number] DOB: 1955-10-26 DOA: 04/21/2016 PCP: No primary care provider on file.  Outpatient Specialists: [facility], Dr. Scot Dock  Brief Narrative: [name] is a 61 y.o. female with a past medical history significant for ESRD on HD MWF, s/p Liver transplant for hep C followed at [facility], AS s/p AVR and CABG in 2015 on warfarin who presented with acute worsening of chronic foot pain due to PVD. Her vascular surgeon, Dr. Scot Dock, had planned BKA 3/20, though she became unable to control the pain, described as constant, excruciating and not relieved by oral narcotics at home. On presentation, she was afebrile, heart rate 93, respirations 21, BP 210/88, SpO2 96%. INR 3.0, WBC 8.7, hgb 13.8. The patient was admitted for pain control, coumadin held and started on heparin. A stress myoview was ordered for surgical risk stratification. Vascular surgery was consulted and plans on inpatient BKA once INR drops below 2.0.     Assessment & Plan: Principal Problem:   Claudication of left lower extremity ([place]) Active Problems:   Hypothyroidism   Type 2 diabetes mellitus with chronic kidney disease ([place])   Anemia due to chronic renal failure    End stage renal disease on dialysis (HCC)   CAD (coronary artery disease), native coronary artery   History of prosthetic aortic valve replacement   Chronic combined systolic and diastolic congestive heart failure (HCC)   COPD (chronic obstructive pulmonary disease) (HCC)   Chronic anticoagulation w/ coumadin, goal INR 2.5-3.0 w/ AVR   S/P liver transplant ([place])   Foot pain   Critical lower limb ischemia   Acute on chronic peripheral vascular disease with dry gangrene and intractable ischemic pain:    - Increasing frequency > dilaudid 1mg  q3h prn pain - Zofran prn nausea - [facility] consulted, recommending inpatient BKA once INR <1.8. INR 3.05 today,  - CV clearance per cardiology (see note 04/17/16)-NM stress test  completed ,Myoview perf was low risk with anterolateral scar and no ischemia. - Strongly urged to quit smoking  ESRD on HD MWF: Sees Dr. Justin Mend as outpatient. - HD per nephrology - Continue phoslo  Liver transplant: Followed at [facility] hepatology.  - Continue prograf.    transplant hepatologist on-call who reviewed the records, recommends continue tacrolimus at this dose.   Aortic stenosis s/p mechanical AVR: On warfarin.  Per Dr. Evette Georges note, will need bridging.   - needs  heparin gtt when INR less than 2.5,   since she has a [employer] mechanical valve, she will require bridging once her Coumadin is held to reduce the likelihood of prosthetic valve thrombosis.  Status post vitamin K 2 mg IV  CAD s/p CABG: Per Dr. Evette Georges note, will need Lexiscan Myoview.  If normal, no further workup. - Myoview interpretation per cards pending - Continue aspirin, metoprolol  Hypertension:  - Treat pain - Continue metoprolol - HD per nephrology - Hydralazine prn ordered  Hypothyroidism:  - Continue levothyroxine  DVT prophylaxis: Heparin gtt Code Status: DNR Family Communication: None at bedside Disposition Plan:continue  inpatient as she will require IV pain management and planned BKA as an inpatient.   Consultants:   [facility], Dr. Scot Dock  Cardiology  Nephrology  Procedures:   Myoview 3/13    Antimicrobials:  None   Subjective: Patient complaining of a lot of lower extremity pain, denies any chest pain or shortness of breath  Objective: Vitals:   04/22/16 1703 04/22/16 2047 04/23/16 0421 04/23/16 0507  BP: [name])  179/61 (!) 177/65 (!) 191/69 (!) 159/60  Pulse: 73 70 72 72  Resp: 18 16 17    Temp: 98.3 F (36.8 C) 99.2 F (37.3 C) 98.6 F (37 C)   TempSrc: Oral     SpO2: 95% 99% 95%   Weight:  58 kg (127 lb 13.9 oz)    Height:        Intake/Output Summary (Last 24 hours) at 04/23/16 0739 Last data filed at 04/23/16 0600  Gross per 24 hour  Intake              400 ml    Output                0 ml  Net              400 ml   Filed Weights   04/21/16 2200 04/22/16 2047  Weight: 58.7 kg (129 lb 6.6 oz) 58 kg (127 lb 13.9 oz)   Examination: General exam: 61 y.o. female in evident pain Respiratory system: Non-labored breathing ambient air. Clear to auscultation bilaterally.  Cardiovascular system: Regular rate and rhythm. No murmur, rub, or gallop. No JVD, and no pedal edema. 2+ radial pulses. Absent DP pulses. Left thigh graft +thrill.  Gastrointestinal system: Abdomen soft, non-tender, non-distended, with normoactive bowel sounds. No organomegaly or masses felt. Central nervous system: Alert and oriented. No focal neurological deficits. Extremities: Warm, no deformities Skin: Left heel ulcer ~3cm x 4cm and dry gangrene at toes. Psychiatry: Judgement and insight appear normal. Mood & affect appropriate.   Data Reviewed: I have personally reviewed following labs and imaging studies  CBC:  Recent Labs Lab 04/21/16 2157 04/22/16 0645  WBC 8.7 7.2  NEUTROABS 3.6  --   HGB 13.8 11.9*  HCT 41.4 36.0  MCV 88.7 89.1  PLT 133* 85*   Basic Metabolic Panel:  Recent Labs Lab 04/21/16 2157  NA 137  K 3.5  CL 97*  CO2 29  GLUCOSE 99  BUN 5*  CREATININE 3.51*  CALCIUM 8.3*   GFR: Estimated Creatinine Clearance: 14.9 mL/min (by C-G formula based on SCr of 3.51 mg/dL (H)). Liver Function Tests:  Recent Labs Lab 04/21/16 2157  AST 60*  ALT 14  ALKPHOS 226*  BILITOT 1.5*  PROT 7.5  ALBUMIN 2.3*   No results for input(s): LIPASE, AMYLASE in the last 168 hours. No results for input(s): AMMONIA in the last 168 hours. Coagulation Profile:  Recent Labs Lab 04/17/16 04/21/16 2157 04/22/16 0645 04/22/16 1911  INR 3.0 2.76 3.12 3.05   Cardiac Enzymes: No results for input(s): CKTOTAL, CKMB, CKMBINDEX, TROPONINI in the last 168 hours. BNP (last 3 results) No results for input(s): PROBNP in the last 8760 hours. HbA1C: No results for  input(s): HGBA1C in the last 72 hours. CBG:  Recent Labs Lab 04/21/16 2218 04/22/16 1024 04/22/16 1146 04/22/16 1659 04/22/16 2046  GLUCAP 88 70 85 94 100*   Lipid Profile: No results for input(s): CHOL, HDL, LDLCALC, TRIG, CHOLHDL, LDLDIRECT in the last 72 hours. Thyroid Function Tests: No results for input(s): TSH, T4TOTAL, FREET4, T3FREE, THYROIDAB in the last 72 hours. Anemia Panel: No results for input(s): VITAMINB12, FOLATE, FERRITIN, TIBC, IRON, RETICCTPCT in the last 72 hours. Urine analysis:    Component Value Date/Time   COLORURINE YELLOW 11/02/2012 0428   APPEARANCEUR CLOUDY (A) 11/02/2012 0428   LABSPEC 1.011 11/02/2012 0428   PHURINE 8.0 11/02/2012 0428   GLUCOSEU 250 (A) 11/02/2012 0428   HGBUR LARGE (A) 11/02/2012  [place] 11/02/2012 [place] 11/02/2012 0428   PROTEINUR >300 (A) 11/02/2012 0428   UROBILINOGEN 0.2 11/02/2012 0428   NITRITE NEGATIVE 11/02/2012 0428   LEUKOCYTESUR NEGATIVE 11/02/2012 0428   No results found for this or any previous visit (from the past 240 hour(s)).    Radiology Studies: Nm Myocar Multi W/spect W/wall Motion / Ef  Result Date: 04/22/2016 CLINICAL DATA:  Preop foot fracture. EXAM: MYOCARDIAL IMAGING WITH SPECT (REST AND PHARMACOLOGIC-STRESS) GATED LEFT VENTRICULAR WALL MOTION STUDY LEFT VENTRICULAR EJECTION FRACTION TECHNIQUE: Standard myocardial SPECT imaging was performed after resting intravenous injection of 10 mCi Tc-39m tetrofosmin. Subsequently, intravenous infusion of Lexiscan was performed under the supervision of the Cardiology staff. At peak effect of the drug, 30 mCi Tc-91m tetrofosmin was injected intravenously and standard myocardial SPECT imaging was performed. Quantitative gated imaging was also performed to evaluate left ventricular wall motion, and estimate left ventricular ejection fraction. COMPARISON:  None. FINDINGS: Perfusion: There is a fixed defects noted in the lateral  wall extending into the anterior and inferior walls compatible with old infarct. No significant reversibility to suggest ischemia. Wall Motion: Mild generalized hypokinesia S, slightly more pronounced in the area of scar Left Ventricular Ejection Fraction: 46 % End diastolic volume 657 ml End systolic volume 79 ml IMPRESSION: 1. 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 No ischemia. 2. Generalized hypokinesia, slightly more pronounced in the area of scar. 3. Left ventricular ejection fraction 46% 4. Non invasive risk stratification*: In hernia *2012 Appropriate Use Criteria for Coronary Revascularization Focused Update: J Am Coll Cardiol. 9038;33(3):832-919. http://content.[email or web address].aspx?articleid=[email or web address] Electronically Signed   By: Rolm Baptise M.D.   On: 04/22/2016 15:54    Scheduled Meds: . aspirin EC  81 mg Oral Daily  . calcium acetate  667 mg Oral TID WC  . gabapentin  100 mg Oral Q M,W,F-HD  . gabapentin  300 mg Oral QHS  . insulin aspart  0-5 Units Subcutaneous QHS  . insulin aspart  0-9 Units Subcutaneous TID WC  . levothyroxine  175 mcg Oral QAC breakfast  . metoprolol tartrate  25 mg Oral BID  . senna-docusate  2 tablet Oral QHS  . tacrolimus  2 mg Oral BID   Continuous Infusions:   LOS: 2 days   Time spent: 25 minutes.  Reyne Dumas, MD [facility] Pager [phone]  If 7PM-7AM, please contact night-coverage [email or web address] Password [facility] 04/23/2016, 7:39 AM

## 2016-04-24 ENCOUNTER — Encounter (HOSPITAL_COMMUNITY)
Admission: AD | Disposition: A | Discharge status: Rehabilitation Facility | Payer: Self-pay | Source: Other Acute Inpatient Hospital | Attending: Internal Medicine

## 2016-04-24 ENCOUNTER — Inpatient Hospital Stay (HOSPITAL_COMMUNITY): Payer: Medicare Other | Admitting: Anesthesiology

## 2016-04-24 ENCOUNTER — Encounter (HOSPITAL_COMMUNITY): Payer: Self-pay | Admitting: Surgery

## 2016-04-24 DIAGNOSIS — N181 Chronic kidney disease, stage 1: Secondary | ICD-10-CM

## 2016-04-24 HISTORY — PX: AMPUTATION: SHX166

## 2016-04-24 LAB — COMPREHENSIVE METABOLIC PANEL
ALT: 17 U/L (ref 14–54)
ANION GAP: 9 (ref 5–15)
AST: 68 U/L — ABNORMAL HIGH (ref 15–41)
Albumin: 1.8 g/dL — ABNORMAL LOW (ref 3.5–5.0)
Alkaline Phosphatase: 253 U/L — ABNORMAL HIGH (ref 38–126)
BUN: 6 mg/dL (ref 6–20)
CHLORIDE: 99 mmol/L — AB (ref 101–111)
CO2: 25 mmol/L (ref 22–32)
Calcium: 8.1 mg/dL — ABNORMAL LOW (ref 8.9–10.3)
Creatinine, Ser: 3.42 mg/dL — ABNORMAL HIGH (ref 0.44–1.00)
GFR, EST AFRICAN AMERICAN: 16 mL/min — AB (ref >60)
GFR, EST NON AFRICAN AMERICAN: 13 mL/min — AB (ref >60)
Glucose, Bld: 95 mg/dL (ref 65–99)
Potassium: 3.6 mmol/L (ref 3.5–5.1)
Sodium: 133 mmol/L — ABNORMAL LOW (ref 135–145)
Total Bilirubin: 1.6 mg/dL — ABNORMAL HIGH (ref 0.3–1.2)
Total Protein: 6.1 g/dL — ABNORMAL LOW (ref 6.5–8.1)

## 2016-04-24 LAB — GLUCOSE, CAPILLARY
GLUCOSE-CAPILLARY: 73 mg/dL (ref 65–99)
GLUCOSE-CAPILLARY: 72 mg/dL (ref 65–99)
Glucose-Capillary: 75 mg/dL (ref 65–99)
Glucose-Capillary: 82 mg/dL (ref 65–99)
Glucose-Capillary: 149 mg/dL — ABNORMAL HIGH (ref 65–99)

## 2016-04-24 LAB — CBC
HCT: 35.4 % — ABNORMAL LOW (ref 36.0–46.0)
Hemoglobin: 11.9 g/dL — ABNORMAL LOW (ref 12.0–15.0)
MCH: 29.3 pg (ref 26.0–34.0)
MCHC: 33.6 g/dL (ref 30.0–36.0)
MCV: 87.2 fL (ref 78.0–100.0)
Platelets: 91 10*3/uL — ABNORMAL LOW (ref 150–400)
RBC: 4.06 MIL/uL (ref 3.87–5.11)
RDW: 16.3 % — AB (ref 11.5–15.5)
WBC: 7.6 10*3/uL (ref 4.0–10.5)

## 2016-04-24 LAB — SURGICAL PCR SCREEN
MRSA, PCR: NEGATIVE
Staphylococcus aureus: NEGATIVE

## 2016-04-24 LAB — APTT

## 2016-04-24 LAB — PROTIME-INR
INR: 1.67
PROTHROMBIN TIME: 19.9 s — AB (ref 11.4–15.2)

## 2016-04-24 LAB — HEPARIN LEVEL (UNFRACTIONATED): Heparin Unfractionated: 0.68 IU/mL (ref 0.30–0.70)

## 2016-04-24 SURGERY — AMPUTATION BELOW KNEE
Anesthesia: Regional | Site: Leg Lower | Laterality: Left

## 2016-04-24 MED ORDER — HEPARIN (PORCINE) IN NACL 100-0.45 UNIT/ML-% IJ SOLN
1300.0000 [IU]/h | INTRAMUSCULAR | Status: DC
  Administered 2016-04-24 – 2016-04-25 (×2): 1400 [IU]/h via INTRAVENOUS
  Administered 2016-04-26: 1200 [IU]/h via INTRAVENOUS
  Administered 2016-04-27 – 2016-04-29 (×4): 1300 [IU]/h via INTRAVENOUS
  Filled 2016-04-24 (×7): qty 250

## 2016-04-24 MED ORDER — LABETALOL HCL 5 MG/ML IV SOLN: 10.0000 mg | INTRAVENOUS | Status: DC | PRN

## 2016-04-24 MED ORDER — BACITRACIN ZINC 500 UNIT/GM EX OINT
TOPICAL_OINTMENT | CUTANEOUS | Status: AC
  Filled 2016-04-24: qty 28.35

## 2016-04-24 MED ORDER — PHENOL 1.4 % MT LIQD: 1.0000 | OROMUCOSAL | Status: DC | PRN

## 2016-04-24 MED ORDER — DEXTROSE 5 % IV SOLN
INTRAVENOUS | Status: DC | PRN
  Administered 2016-04-24: 50 ug/min via INTRAVENOUS

## 2016-04-24 MED ORDER — BOOST / RESOURCE BREEZE PO LIQD
1.0000 | Freq: Three times a day (TID) | ORAL | Status: DC
  Administered 2016-04-24 – 2016-04-29 (×15): 1 via ORAL

## 2016-04-24 MED ORDER — FENTANYL CITRATE (PF) 100 MCG/2ML IJ SOLN
INTRAMUSCULAR | Status: AC
  Administered 2016-04-24: 100 ug
  Filled 2016-04-24: qty 2

## 2016-04-24 MED ORDER — PHENYLEPHRINE HCL 10 MG/ML IJ SOLN
INTRAMUSCULAR | Status: DC | PRN
  Administered 2016-04-24: 40 ug via INTRAVENOUS
  Administered 2016-04-24: 120 ug via INTRAVENOUS
  Administered 2016-04-24 (×2): 80 ug via INTRAVENOUS

## 2016-04-24 MED ORDER — GUAIFENESIN-DM 100-10 MG/5ML PO SYRP: 15.0000 mL | ORAL_SOLUTION | ORAL | Status: DC | PRN

## 2016-04-24 MED ORDER — DEXTROSE 5 % IV SOLN
1.5000 g | Freq: Two times a day (BID) | INTRAVENOUS | Status: AC
  Administered 2016-04-24 – 2016-04-25 (×2): 1.5 g via INTRAVENOUS
  Filled 2016-04-24 (×2): qty 1.5

## 2016-04-24 MED ORDER — ALUM & MAG HYDROXIDE-SIMETH 200-200-20 MG/5ML PO SUSP: 15.0000 mL | ORAL | Status: DC | PRN

## 2016-04-24 MED ORDER — MAGNESIUM SULFATE 2 GM/50ML IV SOLN
2.0000 g | Freq: Every day | INTRAVENOUS | Status: DC | PRN
  Filled 2016-04-24: qty 50

## 2016-04-24 MED ORDER — PROPOFOL 500 MG/50ML IV EMUL
INTRAVENOUS | Status: DC | PRN
  Administered 2016-04-24: 100 ug/kg/min via INTRAVENOUS

## 2016-04-24 MED ORDER — 0.9 % SODIUM CHLORIDE (POUR BTL) OPTIME
TOPICAL | Status: DC | PRN
  Administered 2016-04-24: 1000 mL

## 2016-04-24 MED ORDER — BUPIVACAINE-EPINEPHRINE (PF) 0.5% -1:200000 IJ SOLN
INTRAMUSCULAR | Status: DC | PRN
  Administered 2016-04-24: 25 mL via PERINEURAL
  Administered 2016-04-24: 10 mL via PERINEURAL

## 2016-04-24 MED ORDER — PROPOFOL 10 MG/ML IV BOLUS
INTRAVENOUS | Status: DC | PRN
  Administered 2016-04-24: 90 mg via INTRAVENOUS

## 2016-04-24 MED ORDER — SODIUM CHLORIDE 0.9 % IV SOLN
INTRAVENOUS | Status: DC
  Administered 2016-04-24: 10:00:00 via INTRAVENOUS

## 2016-04-24 MED ORDER — CHLORHEXIDINE GLUCONATE CLOTH 2 % EX PADS
6.0000 | MEDICATED_PAD | Freq: Once | CUTANEOUS | Status: DC
  Administered 2016-04-24: 6 via TOPICAL

## 2016-04-24 MED ORDER — BACITRACIN ZINC 500 UNIT/GM EX OINT
TOPICAL_OINTMENT | CUTANEOUS | Status: DC | PRN
  Administered 2016-04-24: 1 via TOPICAL

## 2016-04-24 MED ORDER — PROPOFOL 10 MG/ML IV BOLUS
INTRAVENOUS | Status: AC
  Filled 2016-04-24: qty 20

## 2016-04-24 MED ORDER — FENTANYL CITRATE (PF) 100 MCG/2ML IJ SOLN
25.0000 ug | INTRAMUSCULAR | Status: DC | PRN
  Administered 2016-04-29 – 2016-04-30 (×5): 50 ug via INTRAVENOUS
  Filled 2016-04-24 (×4): qty 2

## 2016-04-24 MED ORDER — METOPROLOL TARTRATE 5 MG/5ML IV SOLN: 2.0000 mg | INTRAVENOUS | Status: DC | PRN

## 2016-04-24 MED ORDER — LIDOCAINE HCL (CARDIAC) 20 MG/ML IV SOLN
INTRAVENOUS | Status: DC | PRN
  Administered 2016-04-24: 40 mg via INTRAVENOUS

## 2016-04-24 MED ORDER — DEXTROSE 5 % IV SOLN
1.5000 g | INTRAVENOUS | Status: AC
  Administered 2016-04-24: 1.5 g via INTRAVENOUS
  Filled 2016-04-24: qty 1.5

## 2016-04-24 MED ORDER — MIDAZOLAM HCL 2 MG/2ML IJ SOLN
INTRAMUSCULAR | Status: AC
  Administered 2016-04-24: 1 mg
  Filled 2016-04-24: qty 2

## 2016-04-24 MED ORDER — POTASSIUM CHLORIDE CRYS ER 20 MEQ PO TBCR
20.0000 meq | EXTENDED_RELEASE_TABLET | Freq: Every day | ORAL | Status: AC | PRN
  Administered 2016-04-27: 20 meq via ORAL

## 2016-04-24 MED ORDER — FENTANYL CITRATE (PF) 100 MCG/2ML IJ SOLN
INTRAMUSCULAR | Status: AC
  Filled 2016-04-24: qty 2

## 2016-04-24 MED ORDER — PHENYLEPHRINE 40 MCG/ML (10ML) SYRINGE FOR IV PUSH (FOR BLOOD PRESSURE SUPPORT)
PREFILLED_SYRINGE | INTRAVENOUS | Status: AC
  Filled 2016-04-24: qty 10

## 2016-04-24 MED ORDER — HEPARIN (PORCINE) IN NACL 100-0.45 UNIT/ML-% IJ SOLN
1400.0000 [IU]/h | INTRAMUSCULAR | Status: DC
  Administered 2016-04-24: 1400 [IU]/h via INTRAVENOUS
  Filled 2016-04-24: qty 250

## 2016-04-24 MED ORDER — PANTOPRAZOLE SODIUM 40 MG PO TBEC
40.0000 mg | DELAYED_RELEASE_TABLET | Freq: Every day | ORAL | Status: DC
  Administered 2016-04-24 – 2016-04-30 (×7): 40 mg via ORAL
  Filled 2016-04-24 (×7): qty 1

## 2016-04-24 MED ORDER — MIDAZOLAM HCL 2 MG/2ML IJ SOLN
INTRAMUSCULAR | Status: AC
  Filled 2016-04-24: qty 2

## 2016-04-24 MED ORDER — PRO-STAT SUGAR FREE PO LIQD
30.0000 mL | Freq: Two times a day (BID) | ORAL | Status: DC
  Administered 2016-04-24 – 2016-04-29 (×10): 30 mL via ORAL
  Filled 2016-04-24 (×12): qty 30

## 2016-04-24 SURGICAL SUPPLY — 51 items
BANDAGE ESMARK 6X9 LF (GAUZE/BANDAGES/DRESSINGS) ×1 IMPLANT
BLADE SAW RECIP 87.9 MT (BLADE) ×3 IMPLANT
BNDG COHESIVE 6X5 TAN STRL LF (GAUZE/BANDAGES/DRESSINGS) ×3 IMPLANT
BNDG ELASTIC 2 VLCR STRL LF (GAUZE/BANDAGES/DRESSINGS) ×3 IMPLANT
BNDG ESMARK 6X9 LF (GAUZE/BANDAGES/DRESSINGS) ×3
BNDG GAUZE ELAST 4 BULKY (GAUZE/BANDAGES/DRESSINGS) ×3 IMPLANT
CANISTER SUCT 3000ML PPV (MISCELLANEOUS) ×3 IMPLANT
CLIP TI MEDIUM 6 (CLIP) ×3 IMPLANT
COVER SURGICAL LIGHT HANDLE (MISCELLANEOUS) ×3 IMPLANT
CUFF TOURNIQUET SINGLE 24IN (TOURNIQUET CUFF) ×3 IMPLANT
DRAIN CHANNEL 19F RND (DRAIN) IMPLANT
DRAPE ORTHO SPLIT 77X108 STRL (DRAPES) ×4
DRAPE PROXIMA HALF (DRAPES) ×3 IMPLANT
DRAPE SURG ORHT 6 SPLT 77X108 (DRAPES) ×2 IMPLANT
DRAPE U-SHAPE 47X51 STRL (DRAPES) ×3 IMPLANT
DRSG ADAPTIC 3X8 NADH LF (GAUZE/BANDAGES/DRESSINGS) ×3 IMPLANT
ELECT REM PT RETURN 9FT ADLT (ELECTROSURGICAL) ×3
ELECTRODE REM PT RTRN 9FT ADLT (ELECTROSURGICAL) ×1 IMPLANT
EVACUATOR SILICONE 100CC (DRAIN) IMPLANT
GAUZE SPONGE 4X4 12PLY STRL (GAUZE/BANDAGES/DRESSINGS) IMPLANT
GLOVE BIO SURGEON STRL SZ 6.5 (GLOVE) ×2 IMPLANT
GLOVE BIO SURGEON STRL SZ7.5 (GLOVE) ×3 IMPLANT
GLOVE BIO SURGEONS STRL SZ 6.5 (GLOVE) ×1
GLOVE BIOGEL PI IND STRL 7.0 (GLOVE) ×3 IMPLANT
GLOVE BIOGEL PI IND STRL 8 (GLOVE) ×1 IMPLANT
GLOVE BIOGEL PI INDICATOR 7.0 (GLOVE) ×6
GLOVE BIOGEL PI INDICATOR 8 (GLOVE) ×2
GLOVE SURG SS PI 6.5 STRL IVOR (GLOVE) ×3 IMPLANT
GOWN STRL REUS W/ TWL LRG LVL3 (GOWN DISPOSABLE) ×3 IMPLANT
GOWN STRL REUS W/TWL LRG LVL3 (GOWN DISPOSABLE) ×6
KIT BASIN OR (CUSTOM PROCEDURE TRAY) ×3 IMPLANT
KIT ROOM TURNOVER OR (KITS) ×3 IMPLANT
NS IRRIG 1000ML POUR BTL (IV SOLUTION) ×3 IMPLANT
PACK GENERAL/GYN (CUSTOM PROCEDURE TRAY) ×3 IMPLANT
PAD ARMBOARD 7.5X6 YLW CONV (MISCELLANEOUS) ×6 IMPLANT
PADDING CAST COTTON 6X4 STRL (CAST SUPPLIES) ×3 IMPLANT
SPONGE GAUZE 4X4 12PLY STER LF (GAUZE/BANDAGES/DRESSINGS) ×3 IMPLANT
STAPLER VISISTAT (STAPLE) ×3 IMPLANT
STOCKINETTE IMPERVIOUS LG (DRAPES) ×3 IMPLANT
SUT ETHILON 3 0 PS 1 (SUTURE) IMPLANT
SUT SILK 0 TIES 10X30 (SUTURE) ×3 IMPLANT
SUT SILK 2 0 (SUTURE) ×2
SUT SILK 2 0 SH CR/8 (SUTURE) ×3 IMPLANT
SUT SILK 2-0 18XBRD TIE 12 (SUTURE) ×1 IMPLANT
SUT SILK 3 0 (SUTURE) ×2
SUT SILK 3-0 18XBRD TIE 12 (SUTURE) ×1 IMPLANT
SUT VIC AB 2-0 CT1 18 (SUTURE) ×3 IMPLANT
TOWEL OR 17X24 6PK STRL BLUE (TOWEL DISPOSABLE) ×3 IMPLANT
TOWEL OR 17X26 10 PK STRL BLUE (TOWEL DISPOSABLE) ×3 IMPLANT
UNDERPAD 30X30 (UNDERPADS AND DIAPERS) ×3 IMPLANT
WATER STERILE IRR 1000ML POUR (IV SOLUTION) ×3 IMPLANT

## 2016-04-24 NOTE — Anesthesia Postprocedure Evaluation (Signed)
Anesthesia Post Note  Patient: [name]  Procedure(s) Performed: Procedure(s) (LRB): AMPUTATION BELOW KNEE (Left)  Patient location during evaluation: PACU Anesthesia Type: Regional and General Level of consciousness: awake and alert Pain management: pain level controlled Vital Signs Assessment: post-procedure vital signs reviewed and stable Respiratory status: spontaneous breathing, nonlabored ventilation, respiratory function stable and patient connected to nasal cannula oxygen Cardiovascular status: blood pressure returned to baseline and stable Postop Assessment: no signs of nausea or vomiting Anesthetic complications: no       Last Vitals:  Vitals:   04/24/16 1150 04/24/16 1202  BP: (!) 144/68   Pulse: 72   Resp: 17   Temp:  36.4 C    Last Pain:  Vitals:   04/24/16 1202  TempSrc:   PainSc: Asleep                 Brix Brearley,W. EDMOND

## 2016-04-24 NOTE — Progress Notes (Addendum)
ANTICOAGULATION CONSULT NOTE - Follow Up Consult  Pharmacy Consult for heparin Indication: AVR  Labs:  Recent Labs  04/21/16 2157 04/22/16 0645 04/22/16 1911 04/23/16 0640 04/23/16 1442 04/24/16 0104  HGB 13.8 11.9*  --  12.2  --  11.9*  HCT 41.4 36.0  --  35.9*  --  35.4*  PLT 133* 85*  --  96*  --  91*  LABPROT 29.7* 32.8* 32.2* 33.5*  --  19.9*  INR 2.76 3.12 3.05 3.20  --  1.67  CREATININE 3.51*  --   --   --  6.21* 3.42*    Assessment: 61yo female on Coumadin PTA for AVR (high INR goal) now w/ INR reversed for BKA, to begin heparin.  Goal of Therapy:  Heparin level 0.3-0.7 units/ml   Plan:  Will begin heparin gtt at 1400 units/hr (based on requirements 52mo ago) and monitor heparin levels and CBC.  Wynona Neat, PharmD, BCPS  04/24/2016,2:52 AM

## 2016-04-24 NOTE — Consult Note (Signed)
Physical Medicine and Rehabilitation Consult  Reason for Consult: L-BKA due to ischemia Referring Physician: Dr. Scot Dock    HPI: [name] is a 61 y.o. female with history of ESRD--HD MWF, Hep C s/p liver transplant, CAD s/p CABG with AVR, chronic pain, PAD with chronic heel ulcer and ischemic toes. [name] has had progressive pain  and was in process of being set for BKA. [name] was admitted on 04/21/16 with excruciating pain and inability to stand. Chronic coumadin held with heparin bridge till INR normalized. [name] underwent lexiscan and was cleared for surgery by cardiology. [name] underwent L-BKA by Dr. Scot Dock on 04/24/16. Post op PT/OT evaluations ordered and CIR recommended in anticipation of rehab needs.    Review of Systems  Unable to perform ROS: Severity of pain  Musculoskeletal: Positive for myalgias.  Neurological: Positive for sensory change.     Past Medical History:  Diagnosis Date  . Anemia    takes Folic Acid daily  . Anxiety   . Aortic stenosis   . Arthritis    "left hand, back" (08/30/2012)  . Asthma   . CAD (coronary artery disease)   . CAD (coronary artery disease) Jan. 2015   Cath: 20% LAD, 50% D1; s/p LIMA-LAD  . Chest pain 03/06/2015  . Chronic back pain   . Chronic bronchitis ([place])    "q yr w/season changes" (08/30/2012)  . Chronic constipation    takes MIralax and Colace daily  . COPD (chronic obstructive pulmonary disease) ([place])   . Depression    takes Cymbalta for "severe" depression  . End stage renal disease on dialysis [facility]) 02/27/2011   Kidneys shut down at time of liver transplant in Sept 2011 at [facility] in [place], [name] has been on HD ever since.  Dialyzes at [facility] HD on TTS schedule.  Had L forearm graft used 10 months then removed Dec 2012 due to suspected infection.  A right upper arm AVG was placed Dec 2012 but [name] developed steal symptoms acutely and it was ligated the same day.  Never had an AV fistula due to small veins.  Now has L thigh  AVG put in Jan 2013, has not clotted to date.    [name] GERD (gastroesophageal reflux disease)    takes Omeprazole daily  . Headache    "at least monthly" (08/30/2012)  . Hepatitis C   . History of blood transfusion    "several" (08/30/2012)  . Hypertension    takes Metoprolol and Lisinopril daily, sees Dr Bea Graff  . Hypothyroidism    takes Synthroid daily  . Migraine    "last migraine was in 2013" (08/30/2012)  . Neuromuscular disorder ([place])    carpal tunnel in right hand  . Obesity   . Peripheral vascular disease (HCC) hands and legs  . Pneumonia    "today and several times before" (08/30/2012)  . 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 SVT (supraventricular tachycardia) ([place]) 06/09/14  . Tobacco abuse   . Type II diabetes mellitus (HCC)    Levemir 2units daily if > 150    Past Surgical History:  Procedure Laterality Date  . AORTIC VALVE REPLACEMENT N/A 03/15/2013   AVR; Surgeon: Ivin Poot, MD;  Location: [facility] OR; Open Heart Surgery;  32mm[employer] mechanical prosthesis, top hat valve  .  ARTERIOVENOUS GRAFT PLACEMENT Left 10/03/10    forearm  . AV FISTULA PLACEMENT  01/29/2011   Procedure: INSERTION OF ARTERIOVENOUS (AV) GORE-TEX GRAFT ARM;  Surgeon: Elam Dutch, MD;  Location: [facility] OR;  Service: Vascular;  Laterality: Right;  . AV FISTULA PLACEMENT  03/10/2011   Procedure: INSERTION OF ARTERIOVENOUS (AV) GORE-TEX GRAFT THIGH;  Surgeon: Elam Dutch, MD;  Location: [place];  Service: Vascular;  Laterality: Left;  . [place] REMOVAL  12/23/2010   Procedure: REMOVAL OF ARTERIOVENOUS GORETEX GRAFT ([place]);  Surgeon: Elam Dutch, MD;  Location: [place];  Service: Vascular;  Laterality: Left;  procedure started @1736 -1852  . CHOLECYSTECTOMY  1993  . CORONARY ARTERY BYPASS GRAFT N/A 03/15/2013   Procedure: CORONARY ARTERY BYPASS GRAFTING (CABG) times  one using left internal mammary artery.;  Surgeon: Ivin Poot, MD;  Location: [place];  Service: Open Heart Surgery;  Laterality: N/A;  POSS CABG X 1  . CYSTOSCOPY  1990's  . INSERTION OF DIALYSIS CATHETER  12/23/2010   Procedure: INSERTION OF DIALYSIS CATHETER;  Surgeon: Elam Dutch, MD;  Location: [place];  Service: Vascular;  Laterality: Right;  Right Internal Jugular 28cm dialysis catheter insertion procedure time 1701-1720   . INSERTION OF DIALYSIS CATHETER Right 03/11/2016   Procedure: INSERTION OF DIALYSIS CATHETER;  Surgeon: Angelia Mould, MD;  Location: [place];  Service: Vascular;  Laterality: Right;  . INTRAOPERATIVE TRANSESOPHAGEAL ECHOCARDIOGRAM N/A 03/15/2013   Procedure: INTRAOPERATIVE TRANSESOPHAGEAL ECHOCARDIOGRAM;  Surgeon: Ivin Poot, MD;  Location: [place];  Service: Open Heart Surgery;  Laterality: N/A;  . LEFT HEART CATHETERIZATION WITH CORONARY ANGIOGRAM N/A 07/29/2012   Procedure: LEFT HEART CATHETERIZATION WITH CORONARY ANGIOGRAM;  Surgeon: Troy Sine, MD;  Location: [facility] CATH LAB;  Service: Cardiovascular;  Laterality: N/A;  . LEFT HEART CATHETERIZATION WITH CORONARY ANGIOGRAM N/A 03/10/2013   Procedure: LEFT HEART CATHETERIZATION WITH CORONARY ANGIOGRAM;  Surgeon: Troy Sine, MD;  Location: [facility] CATH LAB;  Service: Cardiovascular;  Laterality: N/A;  . LEFT HEART CATHETERIZATION WITH CORONARY/GRAFT ANGIOGRAM N/A 12/24/2011   Procedure: LEFT HEART CATHETERIZATION WITH Beatrix Fetters;  Surgeon: [name], MD;  Location: [facility] CATH LAB;  Service: Cardiovascular;  Laterality: N/A;  . LEFT HEART CATHETERIZATION WITH CORONARY/GRAFT ANGIOGRAM N/A 12/16/2013   Procedure: LEFT HEART CATHETERIZATION WITH Beatrix Fetters;  Surgeon: Troy Sine, MD;  Location: [facility] CATH LAB;  Service: Cardiovascular;  Laterality: N/A;  . LIGATION ARTERIOVENOUS GORTEX GRAFT Left 03/11/2016   Procedure: LIGATION THIGH ARTERIOVENOUS GORTEX GRAFT;  Surgeon: Angelia Mould, MD;  Location: [place];  Service: Vascular;  Laterality: Left;  . LIVER TRANSPLANT  10/25/2009   sees Dr Ferol Luz 1 every 6 months, saw last in Dec 2013. [name] Coord [phone]  . PERIPHERAL VASCULAR CATHETERIZATION N/A 11/06/2015   Procedure: Abdominal Aortogram;  Surgeon: Serafina Mitchell, MD;  Location: [place] CV LAB;  Service: Cardiovascular;  Laterality: N/A;  . PERIPHERAL VASCULAR CATHETERIZATION N/A 11/06/2015   Procedure: Lower Extremity Angiography;  Surgeon: Serafina Mitchell, MD;  Location: [place] CV LAB;  Service: Cardiovascular;  Laterality: N/A;  . PERIPHERAL VASCULAR CATHETERIZATION  11/06/2015   Procedure: Peripheral Vascular Atherectomy;  Surgeon: Serafina Mitchell, MD;  Location: [place] CV LAB;  Service: Cardiovascular;;  Left Superficial femoral  . SHUNTOGRAM Left 05/15/2014   Procedure: SHUNTOGRAM;  Surgeon: Conrad Pemberton, MD;  Location: [facility] CATH LAB;  Service: Cardiovascular;  Laterality: Left;  . SMALL INTESTINE SURGERY  90's  .  SPINAL GROWTH RODS  2010   "put 2 metal rods in my back; they had detetriorated" (08/30/2012)  . THROMBECTOMY    . THROMBECTOMY AND REVISION OF ARTERIOVENTOUS (AV) GORETEX  GRAFT Left 03/30/2014  . THROMBECTOMY AND REVISION OF ARTERIOVENTOUS (AV) GORETEX  GRAFT Left 03/30/2014   Procedure: THROMBECTOMY AND REVISION OF ARTERIOVENTOUS (AV) GORETEX  GRAFT;  Surgeon: Conrad Camas, MD;  Location: [place];  Service: Vascular;  Laterality: Left;  . TUBAL LIGATION  1990's    Family History  Problem Relation Age of Onset  . Cancer Mother   . Diabetes Mother   . Hypertension Mother   . Stroke Mother   . Cancer Father   . Anesthesia problems Neg Hx   . Hypotension Neg Hx   . Malignant hyperthermia Neg Hx   . Pseudochol deficiency Neg Hx     Social History:  Lives with daughter who goes to school 2-3 times a week. Daughter can assist as needed. Per reports that [name] has been smoking Cigarettes--1/2 PPD.  [name] has a 20.00 pack-year  smoking history. [name] has never used smokeless tobacco. Per  reports that [name] does not drink alcohol  or use drugs.   Allergies  Allergen Reactions  . Acetaminophen Other (See Comments)    Liver transplant recipient   . Morphine Itching and Other (See Comments)    hallucinate  . Codeine Itching  . Mirtazapine Other (See Comments)    hallucination    Medications Prior to Admission  Medication Sig Dispense Refill  . aspirin EC 81 MG tablet Take 81 mg by mouth daily.    [name] gabapentin (NEURONTIN) 300 MG capsule Take 300 mg by mouth every evening.    [name] HYDROmorphone (DILAUDID) 2 MG tablet Take 2 mg by mouth.    . Insulin Detemir (LEVEMIR FLEXPEN) 100 UNIT/ML Pen Inject 2-6 Units into the skin See admin instructions. Only if cbg is over 200    . levothyroxine (SYNTHROID, LEVOTHROID) 175 MCG tablet TAKE 1 TABLET (175 MCG TOTAL) BY MOUTH DAILY BEFORE BREAKFAST.  6  . metoprolol tartrate (LOPRESSOR) 25 MG tablet Take 25 mg by mouth 2 (two) times daily.    . nitroGLYCERIN (NITROSTAT) 0.4 MG SL tablet Place 1 tablet (0.4 mg total) under the tongue every 5 (five) minutes as needed for chest pain. 30 tablet 0  . ondansetron (ZOFRAN ODT) 4 MG disintegrating tablet Take 1 tablet (4 mg total) by mouth every 8 (eight) hours as needed for nausea. 6 tablet 0  . senna-docusate (SENOKOT-S) 8.6-50 MG tablet Take 1 tablet by mouth daily.    . tacrolimus (PROGRAF) 1 MG capsule Take 2 capsules (2 mg total) by mouth 2 (two) times daily. Take 2 capsules twice daily. 120 capsule 0  . calcium acetate (PHOSLO) 667 MG capsule Take 1 capsule (667 mg total) by mouth 3 (three) times daily with meals. (Patient not taking: Reported on 04/23/2016) 30 capsule 1  . gabapentin (NEURONTIN) 100 MG capsule Take 100 mg by mouth as directed. 1 capsule on dialysis days    . warfarin (COUMADIN) 2 MG tablet Take 1- 2 tablets by mouth daily or as directed by coumadin clinic (Patient taking differently: Take 2 mg by mouth daily at 6 PM.  Take as directed by coumadin clinic) 45 tablet 3    Home: [place] expects to be discharged to:: Private residence Living Arrangements: Children  Functional History:   Functional Status:  Mobility:  ADL:    Cognition: Cognition Orientation Level: Oriented to person, Oriented to time, Oriented to situation    Blood pressure (!) 162/63, pulse 72, temperature 98.5 F (36.9 C), temperature source Oral, resp. rate 16, height 5' 4.5" (1.638 m), weight 57.4 kg (126 lb 8.7 oz), SpO2 100 %. Physical Exam  Nursing note and vitals reviewed. Constitutional: [name] is oriented to person, place, and time. [name] appears well-developed. [name] appears cachectic. [name] has a sickly appearance.  Seen during HD--moaning and crying about pain right foot/heel as well as left groin  HENT:  Head: Atraumatic.  Mouth/Throat: Oropharynx is clear and moist.  edentulous  Eyes: Conjunctivae are normal. Pupils are equal, round, and reactive to light.  Neck: Normal range of motion. Neck supple.  Cardiovascular: Normal rate and regular rhythm.   Respiratory: Effort normal and breath sounds normal. No stridor. No respiratory distress.  GI: Soft. Bowel sounds are normal. [name] exhibits no distension. There is no tenderness.  Musculoskeletal:  L-BKA with compressive dressing--blood draining through the ACE wrap. Kept right knee flexed upright due to pain--spasms with extension. Right forefoot and heel dusky and ischemic appearing. Warm to touch with faint pulse. Heel with mild bogginess.   Neurological: [name] is alert and oriented to person, place, and time.  Skin: Skin is warm and dry. [name] is not diaphoretic.  Psychiatric:  Anxious and tearful due to pain. Can be redirected at times.     Results for orders placed or performed during the hospital encounter of 04/21/16 (from the past 24 hour(s))  Magnesium     Status: None   Collection Time: 04/23/16  7:30 PM  Result Value Ref Range    Magnesium 1.9 1.7 - 2.4 mg/dL  Glucose, capillary     Status: None   Collection Time: 04/23/16  8:55 PM  Result Value Ref Range   Glucose-Capillary 95 65 - 99 mg/dL  Surgical pcr screen     Status: None   Collection Time: 04/23/16 10:40 PM  Result Value Ref Range   MRSA, PCR NEGATIVE NEGATIVE   Staphylococcus aureus NEGATIVE NEGATIVE  Protime-INR     Status: Abnormal   Collection Time: 04/24/16  1:04 AM  Result Value Ref Range   Prothrombin Time 19.9 (H) 11.4 - 15.2 seconds   INR 1.67   CBC     Status: Abnormal   Collection Time: 04/24/16  1:04 AM  Result Value Ref Range   WBC 7.6 4.0 - 10.5 K/uL   RBC 4.06 3.87 - 5.11 MIL/uL   Hemoglobin 11.9 (L) 12.0 - 15.0 g/dL   HCT 35.4 (L) 36.0 - 46.0 %   MCV 87.2 78.0 - 100.0 fL   MCH 29.3 26.0 - 34.0 pg   MCHC 33.6 30.0 - 36.0 g/dL   RDW 16.3 (H) 11.5 - 15.5 %   Platelets 91 (L) 150 - 400 K/uL  Comprehensive metabolic panel     Status: Abnormal   Collection Time: 04/24/16  1:04 AM  Result Value Ref Range   Sodium 133 (L) 135 - 145 mmol/L   Potassium 3.6 3.5 - 5.1 mmol/L   Chloride 99 (L) 101 - 111 mmol/L   CO2 25 22 - 32 mmol/L   Glucose, Bld 95 65 - 99 mg/dL   BUN 6 6 - 20 mg/dL   Creatinine, Ser 3.42 (H) 0.44 - 1.00 mg/dL   Calcium 8.1 (L) 8.9 - 10.3 mg/dL   Total Protein 6.1 (L) 6.5 - 8.1 g/dL   Albumin 1.8 (L)  3.5 - 5.0 g/dL   AST 68 (H) 15 - 41 U/L   ALT 17 14 - 54 U/L   Alkaline Phosphatase 253 (H) 38 - 126 U/L   Total Bilirubin 1.6 (H) 0.3 - 1.2 mg/dL   GFR calc non Af Amer 13 (L) >60 mL/min   GFR calc Af Amer 16 (L) >60 mL/min   Anion gap 9 5 - 15  Glucose, capillary     Status: None   Collection Time: 04/24/16  8:16 AM  Result Value Ref Range   Glucose-Capillary 75 65 - 99 mg/dL   Comment 1 Notify RN   Heparin level (unfractionated)     Status: None   Collection Time: 04/24/16  9:25 AM  Result Value Ref Range   Heparin Unfractionated 0.68 0.30 - 0.70 IU/mL  APTT     Status: Abnormal   Collection Time: 04/24/16   9:25 AM  Result Value Ref Range   aPTT >200 (HH) 24 - 36 seconds  Glucose, capillary     Status: None   Collection Time: 04/24/16 10:09 AM  Result Value Ref Range   Glucose-Capillary 73 65 - 99 mg/dL  Glucose, capillary     Status: None   Collection Time: 04/24/16 11:33 AM  Result Value Ref Range   Glucose-Capillary 72 65 - 99 mg/dL   Comment 1 Notify RN   Glucose, capillary     Status: None   Collection Time: 04/24/16  3:27 PM  Result Value Ref Range   Glucose-Capillary 82 65 - 99 mg/dL   Comment 1 Notify RN    No results found.  Assessment/Plan: Diagnosis: PAD s/p left BKA 1. Does the need for close, 24 hr/day medical supervision in concert with the patient's rehab needs make it unreasonable for this patient to be served in a less intensive setting? Yes 2. Co-Morbidities requiring supervision/potential complications: pain, PAD RLE, ESRD on HD 3. Due to bladder management, bowel management, safety, skin/wound care, disease management, medication administration, pain management and patient education, does the patient require 24 hr/day rehab nursing? Yes 4. Does the patient require coordinated care of a physician, rehab nurse, PT (1-2 hrs/day, 5 days/week) and OT (1-2 hrs/day, 5 days/week) to address physical and functional deficits in the context of the above medical diagnosis(es)? Yes Addressing deficits in the following areas: balance, endurance, locomotion, strength, transferring, bowel/bladder control, bathing, dressing, feeding, grooming, toileting and psychosocial support 5. Can the patient actively participate in an intensive therapy program of at least 3 hrs of therapy per day at least 5 days per week? Yes 6. The potential for patient to make measurable gains while on inpatient rehab is excellent 7. Anticipated functional outcomes upon discharge from inpatient rehab are supervision and min assist  with PT, supervision and min assist with OT, n/a with SLP. 8. Estimated rehab  length of stay to reach the above functional goals is: potentially 11-15 days 9. Does the patient have adequate social supports and living environment to accommodate these discharge functional goals? Yes and Potentially 10. Anticipated D/C setting: Home 11. Anticipated post D/C treatments: HH therapy and Outpatient therapy 12. Overall Rehab/Functional Prognosis: excellent  RECOMMENDATIONS: This patient's condition is appropriate for continued rehabilitative care in the following setting: CIR Patient has agreed to participate in recommended program. Yes Note that insurance prior authorization may be required for reimbursement for recommended care.  Comment: Therapy evals pending. Rehab Admissions Coordinator to follow up.  Thanks,  [name], MD, Jonny Ruiz,  Ivan Anchors, PA-C 04/24/2016

## 2016-04-24 NOTE — H&P (View-Only) (Signed)
   VASCULAR SURGERY ASSESSMENT & PLAN:  INR is even higher today. I have given a small dose of vitamin K (2 mg IV). If we are going to proceed with a left BKA tomorrow, she may need to receive FFP today.   Please try to dialyze today so that it does not interfere with her planned surgery tomorrow.  SUBJECTIVE: Resting comfortably.  PHYSICAL EXAM: Vitals:   04/22/16 1703 04/22/16 2047 04/23/16 0421 04/23/16 0507  BP: (!) 179/61 (!) 177/65 (!) 191/69 (!) 159/60  Pulse: 73 70 72 72  Resp: 18 16 17    Temp: 98.3 F (36.8 C) 99.2 F (37.3 C) 98.6 F (37 C)   TempSrc: Oral     SpO2: 95% 99% 95%   Weight:  127 lb 13.9 oz (58 kg)    Height:       No change in exam.  LABS: Lab Results  Component Value Date   WBC 8.8 04/23/2016   HGB 12.2 04/23/2016   HCT 35.9 (L) 04/23/2016   MCV 86.3 04/23/2016   PLT 96 (L) 04/23/2016   Lab Results  Component Value Date   CREATININE 3.51 (H) 04/21/2016   Lab Results  Component Value Date   INR 3.20 04/23/2016   CBG (last 3)   Recent Labs  04/22/16 1659 04/22/16 2046 04/23/16 0744  GLUCAP 94 100* 76    Principal Problem:   Claudication of left lower extremity (HCC) Active Problems:   Hypothyroidism   Type 2 diabetes mellitus with chronic kidney disease (HCC)   Anemia due to chronic renal failure    End stage renal disease on dialysis (HCC)   CAD (coronary artery disease), native coronary artery   History of prosthetic aortic valve replacement   Chronic combined systolic and diastolic congestive heart failure (HCC)   COPD (chronic obstructive pulmonary disease) (HCC)   Chronic anticoagulation w/ coumadin, goal INR 2.5-3.0 w/ AVR   S/P liver transplant ([place])   Foot pain   Critical lower limb ischemia   Gae Gallop Beeper: [phone] 04/23/2016

## 2016-04-24 NOTE — Interval H&P Note (Signed)
History and Physical Interval Note:  04/24/2016 9:56 AM  [name]  has presented today for surgery, with the diagnosis of Nonviable tissue  The various methods of treatment have been discussed with the patient and family. After consideration of risks, benefits and other options for treatment, the patient has consented to  Procedure(s): AMPUTATION BELOW KNEE (Left) as a surgical intervention .  The patient's history has been reviewed, patient examined, no change in status, stable for surgery.  I have reviewed the patient's chart and labs.  Questions were answered to the patient's satisfaction.     [name]

## 2016-04-24 NOTE — Op Note (Signed)
    NAME: [name]   MRN: [id number] DOB: September 10, 1955    DATE OF OPERATION: 04/24/2016  PREOP DIAGNOSIS: Ischemic left lower extremity  POSTOP DIAGNOSIS: Same  PROCEDURE: Left below the knee amputation  SURGEON: Judeth Cornfield. Scot Dock, MD, FACS  ASSIST: Silva Bandy, [facility]  ANESTHESIA: Gen.   EBL: 100 cc  INDICATIONS: [name] is a 61 y.o. female who presented with ischemic left lower extremity and no further options for revascularization. Left below-the-knee amputation was recommended.  FINDINGS: Good bleeding below the knee.  TECHNIQUE: The patient was taken to the operating room after the heparin was discontinued. The left lower extremity was prepped and draped in usual sterile fashion. The patient had received a block but was still having some discomfort and therefore this was converted to a general anesthetic. The circumference of the limb was measured 10 7 m distal to the tibial tuberosity and two thirds at this distance was used to mark the anterior skin flap. A long posterior flap of equal length was then marked. The leg was exsanguinated with an Esmarch bandage the tourniquet inflated to 300 mmHg. Under tourniquet control the incision was carried down to the skin and subcutaneous tissue muscle and fascia to the tibia and fibula which were dissected free circumferentially. The arteries were markedly calcified and had to be clamped and divided as the tourniquet was not fully effective here. The arteries and veins were individually suture ligated with 2-0 silk ties and then the tourniquet was then released. Additional hemostasis was obtained using electrocautery. The tibia was shortened further. The wound was irrigated. The fascial layer was closed with interrupted 2-0 Vicryl's. The skin was closed with staples. A sterile dressing was applied. The patient tolerated the procedure well and was transferred to the recovery room in stable condition. All needle and sponge counts were  correct.  [name], MD, FACS [facility]  DATE OF DICTATION:   04/24/2016

## 2016-04-24 NOTE — Transfer of Care (Signed)
Immediate Anesthesia Transfer of Care Note  Patient: [name]  Procedure(s) Performed: Procedure(s): AMPUTATION BELOW KNEE (Left)  Patient Location: PACU  Anesthesia Type:GA combined with regional for post-op pain  Level of Consciousness: awake, oriented and patient cooperative  Airway & Oxygen Therapy: Patient Spontanous Breathing and Patient connected to nasal cannula oxygen  Post-op Assessment: Report given to RN and Post -op Vital signs reviewed and stable  Post vital signs: Reviewed and stable  Last Vitals:  Vitals:   04/24/16 0955 04/24/16 1132  BP:  (!) 160/78  Pulse: 78 72  Resp: 15 18  Temp:  36.1 C    Last Pain:  Vitals:   04/24/16 1132  TempSrc:   PainSc: (P) Asleep      Patients Stated Pain Goal: 0 (27/78/24 2353)  Complications: No apparent anesthesia complications

## 2016-04-24 NOTE — Anesthesia Preprocedure Evaluation (Addendum)
Anesthesia Evaluation  Patient identified by MRN, date of birth, ID band Patient awake    Reviewed: Allergy & Precautions, H&P , NPO status , Patient's Chart, lab work & pertinent test results  Airway Mallampati: III  TM Distance: >3 FB Neck ROM: Full    Dental no notable dental hx. (+) Edentulous Upper, Edentulous Lower, Dental Advisory Given   Pulmonary asthma , Current Smoker,    Pulmonary exam normal breath sounds clear to auscultation       Cardiovascular hypertension, Pt. on medications and Pt. on home beta blockers + CAD, + Peripheral Vascular Disease and +CHF   Rhythm:Regular Rate:Normal     Neuro/Psych  Headaches, Anxiety Depression negative psych ROS   GI/Hepatic GERD  ,(+) Hepatitis -S/p liver transplant   Endo/Other  diabetes, Insulin DependentHypothyroidism   Renal/GU ESRF and DialysisRenal disease  negative genitourinary   Musculoskeletal  (+) Arthritis , Osteoarthritis,    Abdominal   Peds  Hematology negative hematology ROS (+) anemia ,   Anesthesia Other Findings   Reproductive/Obstetrics negative OB ROS                            Anesthesia Physical Anesthesia Plan  ASA: III  Anesthesia Plan: General   Post-op Pain Management:  Regional for Post-op pain   Induction: Intravenous  Airway Management Planned: LMA  Additional Equipment:   Intra-op Plan:   Post-operative Plan: Extubation in OR  Informed Consent: I have reviewed the patients History and Physical, chart, labs and discussed the procedure including the risks, benefits and alternatives for the proposed anesthesia with the patient or authorized representative who has indicated his/her understanding and acceptance.   Dental advisory given  Plan Discussed with: CRNA  Anesthesia Plan Comments:         Anesthesia Quick Evaluation

## 2016-04-24 NOTE — Progress Notes (Signed)
PROGRESS NOTE  [name]  OJJ:[id number] DOB: 01/15/1956 DOA: 04/21/2016 PCP: No primary care provider on file.  Outpatient Specialists: [facility], Dr. Scot Dock  Brief Narrative: [name] is a 61 y.o. female with a past medical history significant for ESRD on HD MWF, s/p Liver transplant for hep C followed at [facility], AS s/p AVR and CABG in 2015 on warfarin who presented with acute worsening of chronic foot pain due to PVD. Her vascular surgeon, Dr. Scot Dock, had planned BKA 3/20, though she became unable to control the pain, described as constant, excruciating and not relieved by oral narcotics at home. On presentation, she was afebrile, heart rate 93, respirations 21, BP 210/88, SpO2 96%. INR 3.0, WBC 8.7, hgb 13.8. The patient was admitted for pain control, coumadin held and started on heparin. A stress myoview was ordered for surgical risk stratification. Vascular surgery  consulted and plans on inpatient BKA once INR drops below 2.0. BKA performed 3/15    Assessment & Plan: Principal Problem:   Claudication of left lower extremity ([place]) Active Problems:   Hypothyroidism   Type 2 diabetes mellitus with chronic kidney disease ([place])   Anemia due to chronic renal failure    End stage renal disease on dialysis (HCC)   CAD (coronary artery disease), native coronary artery   History of prosthetic aortic valve replacement   Chronic combined systolic and diastolic congestive heart failure (HCC)   COPD (chronic obstructive pulmonary disease) (HCC)   Chronic anticoagulation w/ coumadin, goal INR 2.5-3.0 w/ AVR   S/P liver transplant ([place])   Foot pain   Critical lower limb ischemia   Acute on chronic peripheral vascular disease with dry gangrene and intractable ischemic pain:    Continue Dilaudid for pain control - Zofran prn nausea - VVS recommended inpatient BKA 3/15, INR 1.67 - CV clearance per cardiology (see note 04/17/16)-NM stress test completed ,Myoview perf was low  risk with anterolateral scar and no ischemia. Cleared by cardiology - Strongly urged to quit smoking  ESRD on HD MWF: Sees Dr. Justin Mend as outpatient. - HD per nephrology, - Continue phoslo  Liver transplant: Followed at [facility] hepatology.  - Continue prograf.    transplant hepatologist on-call who reviewed the records, recommends continue tacrolimus at this dose.   Aortic stenosis s/p mechanical AVR: On warfarin.  Per Dr. Evette Georges note, will need bridging.   - needs  heparin gtt when INR less than 2.5,   since she has a [employer] mechanical valve, needs bridging once her Coumadin is held to reduce the likelihood of prosthetic valve thrombosis.  Status post vitamin K 2 mg IV on 3/14 Follow INR daily until therapeutic post surgery  CAD s/p CABG: Status post Lexiscan Myoview.   - Myoview interpretation per cards-was low risk with anterolateral scar and no ischemia. Deemed low risk to have her BKA  - Continue aspirin, metoprolol  Hypertension:  - Treat pain - Continue metoprolol - HD per nephrology - Hydralazine prn ordered  Hypothyroidism:  - Continue levothyroxine  DVT prophylaxis: Heparin gtt Code Status: DNR Family Communication: None at bedside Disposition Plan:  [place] 3/15  Consultants:   VVS, Dr. Scot Dock  Cardiology  Nephrology  Procedures:   Myoview 3/13    Antimicrobials:  None   Subjective:  denies any chest pain or shortness of breath  Objective: Vitals:   04/23/16 1744 04/23/16 2100 04/24/16 0432 04/24/16 0822  BP: (!) 162/56 (!) 166/50 (!) 165/46 (!) 180/62  Pulse: 68 75  75 76  Resp: 17 18 19 16   Temp: 98.8 F (37.1 C) 98.7 F (37.1 C) 98.6 F (37 C) 99.4 F (37.4 C)  TempSrc: Oral Oral Oral Oral  SpO2: 97% 96% 95% 95%  Weight:  57.4 kg (126 lb 8.7 oz)    Height:        Intake/Output Summary (Last 24 hours) at 04/24/16 0840 Last data filed at 04/24/16 0600  Gross per 24 hour  Intake           875.23 ml  Output             1000 ml  Net           -124.77 ml   Filed Weights   04/23/16 1300 04/23/16 1705 04/23/16 2100  Weight: 58 kg (127 lb 13.9 oz) 57 kg (125 lb 10.6 oz) 57.4 kg (126 lb 8.7 oz)   Examination: General exam: 61 y.o. female in evident pain Respiratory system: Non-labored breathing ambient air. Clear to auscultation bilaterally.  Cardiovascular system: Regular rate and rhythm. No murmur, rub, or gallop. No JVD, and no pedal edema. 2+ radial pulses. Absent DP pulses. Left thigh graft +thrill.  Gastrointestinal system: Abdomen soft, non-tender, non-distended, with normoactive bowel sounds. No organomegaly or masses felt. Central nervous system: Alert and oriented. No focal neurological deficits. Extremities: Warm, no deformities Skin: Left heel ulcer ~3cm x 4cm and dry gangrene at toes. Psychiatry: Judgement and insight appear normal. Mood & affect appropriate.   Data Reviewed: I have personally reviewed following labs and imaging studies  CBC:  Recent Labs Lab 04/21/16 2157 04/22/16 0645 04/23/16 0640 04/24/16 0104  WBC 8.7 7.2 8.8 7.6  NEUTROABS 3.6  --   --   --   HGB 13.8 11.9* 12.2 11.9*  HCT 41.4 36.0 35.9* 35.4*  MCV 88.7 89.1 86.3 87.2  PLT 133* 85* 96* 91*   Basic Metabolic Panel:  Recent Labs Lab 04/21/16 2157 04/23/16 1442 04/23/16 1930 04/24/16 0104  NA 137 138  --  133*  K 3.5 3.1*  --  3.6  CL 97* 97*  --  99*  CO2 29 31  --  25  GLUCOSE 99 112*  --  95  BUN 5* 15  --  6  CREATININE 3.51* 6.21*  --  3.42*  CALCIUM 8.3* 8.2*  --  8.1*  MG  --   --  1.9  --   PHOS  --  5.0*  --   --    GFR: Estimated Creatinine Clearance: 15.2 mL/min (A) (by C-G formula based on SCr of 3.42 mg/dL (H)). Liver Function Tests:  Recent Labs Lab 04/21/16 2157 04/23/16 1442 04/24/16 0104  AST 60*  --  68*  ALT 14  --  17  ALKPHOS 226*  --  253*  BILITOT 1.5*  --  1.6*  PROT 7.5  --  6.1*  ALBUMIN 2.3* 1.8* 1.8*   No results for input(s): LIPASE, AMYLASE in the last 168 hours. No  results for input(s): AMMONIA in the last 168 hours. Coagulation Profile:  Recent Labs Lab 04/21/16 2157 04/22/16 0645 04/22/16 1911 04/23/16 0640 04/24/16 0104  INR 2.76 3.12 3.05 3.20 1.67   Cardiac Enzymes: No results for input(s): CKTOTAL, CKMB, CKMBINDEX, TROPONINI in the last 168 hours. BNP (last 3 results) No results for input(s): PROBNP in the last 8760 hours. HbA1C: No results for input(s): HGBA1C in the last 72 hours. CBG:  Recent Labs Lab 04/23/16 0744 04/23/16 1137 04/23/16 1742  04/23/16 2055 04/24/16 0816  GLUCAP 76 95 81 95 75   Lipid Profile: No results for input(s): CHOL, HDL, LDLCALC, TRIG, CHOLHDL, LDLDIRECT in the last 72 hours. Thyroid Function Tests: No results for input(s): TSH, T4TOTAL, FREET4, T3FREE, THYROIDAB in the last 72 hours. Anemia Panel: No results for input(s): VITAMINB12, FOLATE, FERRITIN, TIBC, IRON, RETICCTPCT in the last 72 hours. Urine analysis:    Component Value Date/Time   COLORURINE YELLOW 11/02/2012 0428   APPEARANCEUR CLOUDY (A) 11/02/2012 0428   LABSPEC 1.011 11/02/2012 0428   PHURINE 8.0 11/02/2012 0428   GLUCOSEU 250 (A) 11/02/2012 0428   HGBUR LARGE (A) 11/02/2012 0428   BILIRUBINUR NEGATIVE 11/02/2012 0428   KETONESUR NEGATIVE 11/02/2012 0428   PROTEINUR >300 (A) 11/02/2012 0428   UROBILINOGEN 0.2 11/02/2012 0428   NITRITE NEGATIVE 11/02/2012 0428   LEUKOCYTESUR NEGATIVE 11/02/2012 0428   Recent Results (from the past 240 hour(s))  Surgical pcr screen     Status: None   Collection Time: 04/23/16 10:40 PM  Result Value Ref Range Status   MRSA, PCR NEGATIVE NEGATIVE Final   Staphylococcus aureus NEGATIVE NEGATIVE Final    Comment:        The Xpert SA Assay (FDA approved for NASAL specimens in patients over 5 years of age), is one component of a comprehensive surveillance program.  Test performance has been validated by [facility] for patients greater than or equal to 12 year old. It is not intended to  diagnose infection nor to guide or monitor treatment.       Radiology Studies: Nm Myocar Multi W/spect W/wall Motion / Ef  Result Date: 04/22/2016 CLINICAL DATA:  Preop foot fracture. EXAM: MYOCARDIAL IMAGING WITH SPECT (REST AND PHARMACOLOGIC-STRESS) GATED LEFT VENTRICULAR WALL MOTION STUDY LEFT VENTRICULAR EJECTION FRACTION TECHNIQUE: Standard myocardial SPECT imaging was performed after resting intravenous injection of 10 mCi Tc-66m tetrofosmin. Subsequently, intravenous infusion of Lexiscan was performed under the supervision of the Cardiology staff. At peak effect of the drug, 30 mCi Tc-75m tetrofosmin was injected intravenously and standard myocardial SPECT imaging was performed. Quantitative gated imaging was also performed to evaluate left ventricular wall motion, and estimate left ventricular ejection fraction. COMPARISON:  None. FINDINGS: Perfusion: There is a fixed defects noted in the lateral wall extending into the anterior and inferior walls compatible with old infarct. No significant reversibility to suggest ischemia. Wall Motion: Mild generalized hypokinesia S, slightly more pronounced in the area of scar Left Ventricular Ejection Fraction: 46 % End diastolic volume 993 ml End systolic volume 79 ml IMPRESSION: 1. 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 No ischemia. 2. Generalized hypokinesia, slightly more pronounced in the area of scar. 3. Left ventricular ejection fraction 46% 4. Non invasive risk stratification*: In hernia *2012 Appropriate Use Criteria for Coronary Revascularization Focused Update: J Am Coll Cardiol. 7169;67(8):938-101. http://content.airportbarriers.com.aspx?articleid=[email or web address] Electronically Signed   By: Rolm Baptise M.D.   On: 04/22/2016 15:54    Scheduled Meds: . aspirin EC  81 mg Oral Daily  . [START ON 04/25/2016] calcitRIOL  0.5 mcg Oral Q M,W,F-HD  . calcium acetate  667 mg Oral TID WC  . gabapentin  100 mg Oral Q  M,W,F-HD  . gabapentin  300 mg Oral QHS  . insulin aspart  0-5 Units Subcutaneous QHS  . insulin aspart  0-9 Units Subcutaneous TID WC  . levothyroxine  175 mcg Oral QAC breakfast  . metoprolol tartrate  25 mg Oral BID  . multivitamin  1  tablet Oral QHS  . senna-docusate  2 tablet Oral QHS  . tacrolimus  2 mg Oral BID   Continuous Infusions: . heparin 1,400 Units/hr (04/24/16 0329)     LOS: 3 days   Time spent: 25 minutes.  Reyne Dumas, MD [facility] Pager [phone]  If 7PM-7AM, please contact night-coverage [email or web address] Password [email or web address] 04/24/2016, 8:40 AM

## 2016-04-24 NOTE — Anesthesia Procedure Notes (Addendum)
Anesthesia Regional Block: Popliteal block   Pre-Anesthetic Checklist: ,, timeout performed, Correct Patient, Correct Site, Correct Laterality, Correct Procedure, Correct Position, site marked, Risks and benefits discussed, pre-op evaluation,  At surgeon's request and post-op pain management  Laterality: Left  Prep: Maximum Sterile Barrier Precautions used, chloraprep       Needles:  Injection technique: Single-shot  Needle Type: Echogenic Stimulator Needle     Needle Length: 9cm  Needle Gauge: 21     Additional Needles:   Procedures: ultrasound guided, nerve stimulator,,,,,,   Nerve Stimulator or Paresthesia:  Response: Peroneal,  Response: Tibial,   Additional Responses:   Narrative:  Start time: 04/24/2016 9:45 AM End time: 04/24/2016 10:00 AM Injection made incrementally with aspirations every 5 mL. Anesthesiologist: Roderic Palau  Additional Notes: 2% Lidocaine skin wheel. Adductor Canal block with 10cc of 0.5% Bupivicaine w/ 1:200k epi.[name]

## 2016-04-24 NOTE — Progress Notes (Signed)
ANTICOAGULATION CONSULT NOTE - Follow Up Consult  Pharmacy Consult for heparin Indication: AVR  Labs:  Recent Labs  04/21/16 2157 04/22/16 0645 04/22/16 1911 04/23/16 0640 04/23/16 1442 04/24/16 0104 04/24/16 0925  HGB 13.8 11.9*  --  12.2  --  11.9*  --   HCT 41.4 36.0  --  35.9*  --  35.4*  --   PLT 133* 85*  --  96*  --  91*  --   APTT  --   --   --   --   --   --  >200*  LABPROT 29.7* 32.8* 32.2* 33.5*  --  19.9*  --   INR 2.76 3.12 3.05 3.20  --  1.67  --   HEPARINUNFRC  --   --   --   --   --   --  0.68  CREATININE 3.51*  --   --   --  6.21* 3.42*  --     Assessment: 60yo female on Coumadin 2mg  daily PTA for h/o mechanical aortic valve. Goal INR 2.5-3.5. Scheduled for LBKA on 3/20, came to ED 3/12 with severe L foot pain. Warf now on hold, to bridge with IV heparin. Last time on heparin she was therapeutic at 1,550 units/hr. Hgb 11.9, plts low at 91.  Goal of Therapy:  Heparin level 0.3-0.7 units/ml   Plan:  Restart heparin gtt at 1,400 units/hr at 1900 tonight Check 8 hr heparin level Monitor daily heparin level, CBC, s/s of bleed  Elenor Quinones, PharmD, BCPS Clinical Pharmacist Pager [phone] 04/24/2016 1:51 PM

## 2016-04-24 NOTE — Progress Notes (Signed)
[facility] Progress Note  Dialysis Orders:  [place] MWF EDW 58kg Time 3hrs 96min 2/2 bath No heparin  Calcitriol 0.58mcg PO q HD Not currently on ESA - Mircera 184mcg IV last dosed 2/228  OP Labs: hgb 11.8 Tsat 32% P 5.3 PTH 573    Assessment/Plan: 1. PVD/L foot gangrene - per primary s/p L BKA 3/15 with Dr. Scot Dock 2.  ESRD -  MWF  - Cont on schedule K+ 3.6 Use 4K bath  3.  Hypertension/volume  - BP slightly elevated on metoprolol /Net UF 1L with HD yesterday  Now under EDW - plan lower EDW  4.  Anemia  - Hgb 11.9  follow post-op  not on esa currently  5.  Metabolic bone disease -  Cont VDRA/binders Ca acetate binder  6.  Nutrition - Renal diet/vitamins/prot supp with low albumin I have changed diet to regular and family/pt aware to avoid the dairy. Pt eating very little, K on low side. Added some Resource (dislikes Nepro) 7. S/P AVR on coumadin - dosing per pharmacy  8. s/p liver transplant - on prograf  9. DM- per primary    [name] PA-C [place] Pager [phone] 04/24/2016,12:49 PM  LOS: 3 days   I have seen and examined this patient and agree with plan and assessment in the above note with renal recommendations/intervention highlighted. s/p L BKA, no pain at present, small amt bloody ooze on wrap, heparin to be restarted later tonight. Eating very little - have liberalized diet to regular, added some Resource (dislikes Nepro). Continue with TDC for HD. Dialysis tomorrow usual schedule.  Kharon Hixon B,MD 04/24/2016 3:50 PM   Subjective: Seen still groggy s/p L BKA this am   Objective Vitals:   04/24/16 1130 04/24/16 1132 04/24/16 1150 04/24/16 1202  BP: (!) 160/78 (!) 160/78 (!) 144/68   Pulse: 72 72 72   Resp: (!) 6 18 17    Temp:  97 F (36.1 C)  97.5 F (36.4 C)  TempSrc:      SpO2: 100% 100% 100%   Weight:      Height:       Physical Exam General: Ill appearing elderly female, groggy from anesthesia oriented  X3, wants food Heart: RRR +valve click  Lungs: CTAB Extremities: L BKA in ace wrap some oozing blood lower part of wrap; non-fx L thigh AVG Dialysis Access: RIJ [facility]    Additional Objective Labs: Basic Metabolic Panel:  Recent Labs Lab 04/21/16 2157 04/23/16 1442 04/24/16 0104  NA 137 138 133*  K 3.5 3.1* 3.6  CL 97* 97* 99*  CO2 29 31 25   GLUCOSE 99 112* 95  BUN 5* 15 6  CREATININE 3.51* 6.21* 3.42*  CALCIUM 8.3* 8.2* 8.1*  PHOS  --  5.0*  --      Recent Labs Lab 04/21/16 2157 04/23/16 1442 04/24/16 0104  AST 60*  --  68*  ALT 14  --  17  ALKPHOS 226*  --  253*  BILITOT 1.5*  --  1.6*  PROT 7.5  --  6.1*  ALBUMIN 2.3* 1.8* 1.8*     Recent Labs Lab 04/21/16 2157 04/22/16 0645 04/23/16 0640 04/24/16 0104  WBC 8.7 7.2 8.8 7.6  NEUTROABS 3.6  --   --   --   HGB 13.8 11.9* 12.2 11.9*  HCT 41.4 36.0 35.9* 35.4*  MCV 88.7 89.1 86.3 87.2  PLT 133* 85* 96* 91*   Blood Culture    Component Value Date/Time  SDES BLOOD RIGHT HAND 03/18/2016 1655   SPECREQUEST BOTTLES DRAWN AEROBIC ONLY 5CC 03/18/2016 1655   CULT NO GROWTH 5 DAYS 03/18/2016 1655   REPTSTATUS 03/23/2016 FINAL 03/18/2016 1655     Recent Labs Lab 04/23/16 1742 04/23/16 2055 04/24/16 0816 04/24/16 1009 04/24/16 1133  GLUCAP 81 95 75 73 72    Lab Results  Component Value Date   INR 1.67 04/24/2016   INR 3.20 04/23/2016   INR 3.05 04/22/2016   Medications: . heparin     . aspirin EC  81 mg Oral Daily  . [START ON 04/25/2016] calcitRIOL  0.5 mcg Oral Q M,W,F-HD  . calcium acetate  667 mg Oral TID WC  . cefUROXime (ZINACEF)  IV  1.5 g Intravenous Q12H  . gabapentin  100 mg Oral Q M,W,F-HD  . gabapentin  300 mg Oral QHS  . insulin aspart  0-5 Units Subcutaneous QHS  . insulin aspart  0-9 Units Subcutaneous TID WC  . levothyroxine  175 mcg Oral QAC breakfast  . metoprolol tartrate  25 mg Oral BID  . multivitamin  1 tablet Oral QHS  . pantoprazole  40 mg Oral Daily  .  senna-docusate  2 tablet Oral QHS  . tacrolimus  2 mg Oral BID

## 2016-04-24 NOTE — Anesthesia Procedure Notes (Signed)
Procedure Name: LMA Insertion Date/Time: 04/24/2016 10:46 AM Performed by: Manuela Schwartz B Pre-anesthesia Checklist: Patient identified, Emergency Drugs available, Suction available, Patient being monitored and Timeout performed Patient Re-evaluated:Patient Re-evaluated prior to inductionOxygen Delivery Method: Circle system utilized Preoxygenation: Pre-oxygenation with 100% oxygen Intubation Type: IV induction LMA: LMA inserted LMA Size: 4.0 Number of attempts: 1 Placement Confirmation: positive ETCO2 and breath sounds checked- equal and bilateral Tube secured with: Tape Dental Injury: Teeth and Oropharynx as per pre-operative assessment

## 2016-04-25 ENCOUNTER — Encounter (HOSPITAL_COMMUNITY): Payer: Self-pay | Admitting: Vascular Surgery

## 2016-04-25 DIAGNOSIS — I739 Peripheral vascular disease, unspecified: Secondary | ICD-10-CM

## 2016-04-25 LAB — CBC
HCT: 30.2 % — ABNORMAL LOW (ref 36.0–46.0)
HCT: 30.1 % — ABNORMAL LOW (ref 36.0–46.0)
HEMOGLOBIN: 10.3 g/dL — AB (ref 12.0–15.0)
Hemoglobin: 10.2 g/dL — ABNORMAL LOW (ref 12.0–15.0)
MCH: 29.3 pg (ref 26.0–34.0)
MCH: 29.1 pg (ref 26.0–34.0)
MCHC: 34.1 g/dL (ref 30.0–36.0)
MCHC: 33.9 g/dL (ref 30.0–36.0)
MCV: 85.8 fL (ref 78.0–100.0)
MCV: 86 fL (ref 78.0–100.0)
PLATELETS: 95 10*3/uL — AB (ref 150–400)
Platelets: 93 10*3/uL — ABNORMAL LOW (ref 150–400)
RBC: 3.52 MIL/uL — ABNORMAL LOW (ref 3.87–5.11)
RBC: 3.5 MIL/uL — ABNORMAL LOW (ref 3.87–5.11)
RDW: 16 % — ABNORMAL HIGH (ref 11.5–15.5)
RDW: 16.4 % — AB (ref 11.5–15.5)
WBC: 11 10*3/uL — ABNORMAL HIGH (ref 4.0–10.5)
WBC: 11.6 10*3/uL — AB (ref 4.0–10.5)

## 2016-04-25 LAB — COMPREHENSIVE METABOLIC PANEL
ALK PHOS: 214 U/L — AB (ref 38–126)
ALT: 16 U/L (ref 14–54)
ANION GAP: 9 (ref 5–15)
AST: 55 U/L — ABNORMAL HIGH (ref 15–41)
Albumin: 1.5 g/dL — ABNORMAL LOW (ref 3.5–5.0)
BUN: 15 mg/dL (ref 6–20)
CALCIUM: 8 mg/dL — AB (ref 8.9–10.3)
CO2: 26 mmol/L (ref 22–32)
Chloride: 99 mmol/L — ABNORMAL LOW (ref 101–111)
Creatinine, Ser: 4.93 mg/dL — ABNORMAL HIGH (ref 0.44–1.00)
GFR calc non Af Amer: 9 mL/min — ABNORMAL LOW (ref >60)
GFR, EST AFRICAN AMERICAN: 10 mL/min — AB (ref >60)
Glucose, Bld: 198 mg/dL — ABNORMAL HIGH (ref 65–99)
Potassium: 4.2 mmol/L (ref 3.5–5.1)
SODIUM: 134 mmol/L — AB (ref 135–145)
TOTAL PROTEIN: 5.3 g/dL — AB (ref 6.5–8.1)
Total Bilirubin: 0.8 mg/dL (ref 0.3–1.2)

## 2016-04-25 LAB — HEPARIN LEVEL (UNFRACTIONATED)
HEPARIN UNFRACTIONATED: 0.94 [IU]/mL — AB (ref 0.30–0.70)
HEPARIN UNFRACTIONATED: 0.49 [IU]/mL (ref 0.30–0.70)
Heparin Unfractionated: 0.32 IU/mL (ref 0.30–0.70)

## 2016-04-25 LAB — GLUCOSE, CAPILLARY
GLUCOSE-CAPILLARY: 80 mg/dL (ref 65–99)
GLUCOSE-CAPILLARY: 180 mg/dL — AB (ref 65–99)
Glucose-Capillary: 115 mg/dL — ABNORMAL HIGH (ref 65–99)

## 2016-04-25 LAB — PROTIME-INR
INR: 1.78
Prothrombin Time: 20.9 seconds — ABNORMAL HIGH (ref 11.4–15.2)

## 2016-04-25 MED ORDER — ALTEPLASE 2 MG IJ SOLR: 2.0000 mg | Freq: Once | INTRAMUSCULAR | Status: DC | PRN

## 2016-04-25 MED ORDER — PENTAFLUOROPROP-TETRAFLUOROETH EX AERO: 1.0000 "application " | INHALATION_SPRAY | CUTANEOUS | Status: DC | PRN

## 2016-04-25 MED ORDER — SODIUM CHLORIDE 0.9 % IV SOLN: 100.0000 mL | INTRAVENOUS | Status: DC | PRN

## 2016-04-25 MED ORDER — OXYCODONE HCL 5 MG PO TABS
ORAL_TABLET | ORAL | Status: AC
  Filled 2016-04-25: qty 1

## 2016-04-25 MED ORDER — HEPARIN SODIUM (PORCINE) 1000 UNIT/ML DIALYSIS: 1000.0000 [IU] | INTRAMUSCULAR | Status: DC | PRN

## 2016-04-25 MED ORDER — LIDOCAINE-PRILOCAINE 2.5-2.5 % EX CREA: 1.0000 "application " | TOPICAL_CREAM | CUTANEOUS | Status: DC | PRN

## 2016-04-25 MED ORDER — POLYETHYLENE GLYCOL 3350 17 G PO PACK: 17.0000 g | PACK | Freq: Every day | ORAL | 0 refills | Status: DC | PRN

## 2016-04-25 MED ORDER — CALCITRIOL 0.5 MCG PO CAPS
ORAL_CAPSULE | ORAL | Status: AC
  Filled 2016-04-25: qty 1

## 2016-04-25 MED ORDER — HYDROMORPHONE HCL 1 MG/ML IJ SOLN
INTRAMUSCULAR | Status: AC
  Filled 2016-04-25: qty 1

## 2016-04-25 MED ORDER — OXYCODONE HCL 5 MG PO TABS: 5.0000 mg | ORAL_TABLET | ORAL | 0 refills | Status: DC | PRN

## 2016-04-25 MED ORDER — BOOST / RESOURCE BREEZE PO LIQD: 1.0000 | Freq: Three times a day (TID) | ORAL | Status: AC

## 2016-04-25 MED ORDER — GUAIFENESIN-DM 100-10 MG/5ML PO SYRP: 15.0000 mL | ORAL_SOLUTION | ORAL | 0 refills | Status: DC | PRN

## 2016-04-25 MED ORDER — PRO-STAT SUGAR FREE PO LIQD: 30.0000 mL | Freq: Two times a day (BID) | ORAL | 0 refills | Status: DC

## 2016-04-25 MED ORDER — LIDOCAINE HCL (PF) 1 % IJ SOLN: 5.0000 mL | INTRAMUSCULAR | Status: DC | PRN

## 2016-04-25 MED ORDER — PANTOPRAZOLE SODIUM 40 MG PO TBEC: 40.0000 mg | DELAYED_RELEASE_TABLET | Freq: Every day | ORAL | 1 refills | Status: DC

## 2016-04-25 MED ORDER — CALCITRIOL 0.5 MCG PO CAPS: 0.5000 ug | ORAL_CAPSULE | ORAL | 1 refills | Status: DC

## 2016-04-25 NOTE — Progress Notes (Signed)
ANTICOAGULATION CONSULT NOTE - Follow Up Consult  Pharmacy Consult for Heparin (warfarin on hold) Indication: AVR  Allergies  Allergen Reactions  . Acetaminophen Other (See Comments)    Liver transplant recipient   . Morphine Itching and Other (See Comments)    hallucinate  . Codeine Itching  . Mirtazapine Other (See Comments)    hallucination   Patient Measurements: Height: 5' 4.5" (163.8 cm) (from 04/17/16) Weight: 119 lb 11.4 oz (54.3 kg) IBW/kg (Calculated) : 55.85  Vital Signs: Temp: 98.2 F (36.8 C) (03/16 1049) Temp Source: Oral (03/16 1049) BP: 119/73 (03/16 1049) Pulse Rate: 81 (03/16 1049)  Assessment: 61 yo F on Coumadin 2mg  daily PTA for h/o mechanical aortic valve. Goal INR 2.5-3.5. Scheduled for LBKA on 3/20, came to ED 3/12 with severe L foot pain. Warf now on hold, to bridge with IV heparin. 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 Last heparin level went up to 0.94. Last INR was low at 1.78. No issues noted. Hgb down to 10.3, plts low but stable at 93.  Goal of Therapy:  Heparin level 0.3-0.7 units/ml Monitor platelets by anticoagulation protocol: Yes   Plan:  Decrease heparin gtt to 1,100 units/hr Check 8 hr heparin level tonight Monitor daily heparin level, CBC, s/s of bleed F/U restart of Coumadin  Elenor Quinones, PharmD, [facility] Clinical Pharmacist Pager [phone] 04/25/2016 12:43 PM

## 2016-04-25 NOTE — Progress Notes (Signed)
   VASCULAR SURGERY ASSESSMENT & PLAN:  1 Day Post-Op s/p: Left BKA  On heparin for H/O AVR. May resume Coumadin slowly starting tomorrow PM.  Left BKA with moderate oozing from heparin.  SUBJECTIVE: Moderate discomfort.  PHYSICAL EXAM: Vitals:   04/25/16 1000 04/25/16 1015 04/25/16 1030 04/25/16 1049  BP: (!) 120/54 133/65 125/61 119/73  Pulse: 76 77 78 81  Resp:    18  Temp:    98.2 F (36.8 C)  TempSrc:    Oral  SpO2:    98%  Weight:    119 lb 11.4 oz (54.3 kg)  Height:       Her dressing had some bloody drainage, so I changed it.  BKA looks fine   LABS: Lab Results  Component Value Date   WBC 11.0 (H) 04/25/2016   HGB 10.3 (L) 04/25/2016   HCT 30.2 (L) 04/25/2016   MCV 85.8 04/25/2016   PLT 93 (L) 04/25/2016   Lab Results  Component Value Date   CREATININE 4.93 (H) 04/25/2016   Lab Results  Component Value Date   INR 1.78 04/25/2016   CBG (last 3)   Recent Labs  04/24/16 1527 04/24/16 2139 04/25/16 1123  GLUCAP 82 149* 80    Principal Problem:   Claudication of left lower extremity (HCC) Active Problems:   Hypothyroidism   Type 2 diabetes mellitus with chronic kidney disease ([place])   Anemia due to chronic renal failure    End stage renal disease on dialysis ([place])   CAD (coronary artery disease), native coronary artery   History of prosthetic aortic valve replacement   Chronic combined systolic and diastolic congestive heart failure (HCC)   COPD (chronic obstructive pulmonary disease) (HCC)   Chronic anticoagulation w/ coumadin, goal INR 2.5-3.0 w/ AVR   S/P liver transplant ([place])   Foot pain   Critical lower limb ischemia    Gae Gallop Beeper: [phone] 04/25/2016

## 2016-04-25 NOTE — Procedures (Signed)
I was present at this dialysis session. I have reviewed the session itself and made appropriate changes.   Filed Weights   04/23/16 2100 04/24/16 2140 04/25/16 0700  Weight: 57.4 kg (126 lb 8.7 oz) 56.9 kg (125 lb 7.1 oz) 56.6 kg (124 lb 12.5 oz)     Recent Labs Lab 04/23/16 1442  04/25/16 0332  NA 138  < > 134*  K 3.1*  < > 4.2  CL 97*  < > 99*  CO2 31  < > 26  GLUCOSE 112*  < > 198*  BUN 15  < > 15  CREATININE 6.21*  < > 4.93*  CALCIUM 8.2*  < > 8.0*  PHOS 5.0*  --   --   < > = values in this interval not displayed.   Recent Labs Lab 04/21/16 2157  04/23/16 0640 04/24/16 0104 04/25/16 0332  WBC 8.7  < > 8.8 7.6 11.0*  NEUTROABS 3.6  --   --   --   --   HGB 13.8  < > 12.2 11.9* 10.3*  HCT 41.4  < > 35.9* 35.4* 30.2*  MCV 88.7  < > 86.3 87.2 85.8  PLT 133*  < > 96* 91* 93*  < > = values in this interval not displayed.  Scheduled Meds: . aspirin EC  81 mg Oral Daily  . calcitRIOL  0.5 mcg Oral Q M,W,F-HD  . calcium acetate  667 mg Oral TID WC  . cefUROXime (ZINACEF)  IV  1.5 g Intravenous Q12H  . feeding supplement  1 Container Oral TID BM  . feeding supplement (PRO-STAT SUGAR FREE 64)  30 mL Oral BID  . gabapentin  100 mg Oral Q M,W,F-HD  . gabapentin  300 mg Oral QHS  . HYDROmorphone      . insulin aspart  0-5 Units Subcutaneous QHS  . insulin aspart  0-9 Units Subcutaneous TID WC  . levothyroxine  175 mcg Oral QAC breakfast  . metoprolol tartrate  25 mg Oral BID  . multivitamin  1 tablet Oral QHS  . pantoprazole  40 mg Oral Daily  . senna-docusate  2 tablet Oral QHS  . tacrolimus  2 mg Oral BID   Continuous Infusions: . heparin 1,400 Units/hr (04/24/16 1909)   PRN Meds:.alum & mag hydroxide-simeth, famotidine, fentaNYL (SUBLIMAZE) injection, guaiFENesin-dextromethorphan, hydrALAZINE, HYDROmorphone (DILAUDID) injection, labetalol, magnesium sulfate 1 - 4 g bolus IVPB, metoprolol, ondansetron **OR** ondansetron (ZOFRAN) IV, oxyCODONE, phenol, polyethylene  glycol, potassium chloride, senna-docusate    Assessment/Plan: 1. PVD with gangrene s/p LBKA 04/24/16 by Dr. Scot Dock 2. ESRD- cont with HD qMWF 3. Disposition- possible admission to CIR.  Currently a little confused and in pain but hopefully will be more alert after anesthesia wears off. [name] Potts,  MD 04/25/2016, 8:46 AM

## 2016-04-25 NOTE — Progress Notes (Signed)
ANTICOAGULATION CONSULT NOTE - Follow Up Consult  Pharmacy Consult for Heparin (warfarin on hold) Indication: AVR  Allergies  Allergen Reactions  . Acetaminophen Other (See Comments)    Liver transplant recipient   . Morphine Itching and Other (See Comments)    hallucinate  . Codeine Itching  . Mirtazapine Other (See Comments)    hallucination   Patient Measurements: Height: 5' 4.5" (163.8 cm) (from 04/17/16) Weight: 125 lb 7.1 oz (56.9 kg) IBW/kg (Calculated) : 55.85  Vital Signs: Temp: 98.3 F (36.8 C) (03/15 2140) Temp Source: Oral (03/15 2140) BP: 178/67 (03/15 2140) Pulse Rate: 75 (03/15 2140)  Labs:  Recent Labs  04/23/16 0640 04/23/16 1442 04/24/16 0104 04/24/16 0925 04/25/16 0332  HGB 12.2  --  11.9*  --  10.3*  HCT 35.9*  --  35.4*  --  30.2*  PLT 96*  --  91*  --  93*  APTT  --   --   --  >200*  --   LABPROT 33.5*  --  19.9*  --  20.9*  INR 3.20  --  1.67  --  1.78  HEPARINUNFRC  --   --   --  0.68 0.32  CREATININE  --  6.21* 3.42*  --  4.93*    Estimated Creatinine Clearance: 10.6 mL/min (A) (by C-G formula based on SCr of 4.93 mg/dL (H)).   Assessment: Heparin while warfarin on hold peri-operatively, initial heparin level after re-start is therapeutic at 0.32  Goal of Therapy:  Heparin level 0.3-0.7 units/ml Monitor platelets by anticoagulation protocol: Yes   Plan:  -Cont heparin at 1400 units/hr -1200 HL  Narda Bonds 04/25/2016,5:07 AM

## 2016-04-25 NOTE — Progress Notes (Signed)
PROGRESS NOTE  [name]  IOE:[id number] DOB: 1956-01-12 DOA: 04/21/2016 PCP: No primary care provider on file.  Outpatient Specialists: [facility] Hepatology [facility], Dr. Scot Dock  Brief Narrative: [name] is a 61 y.o. female with a past medical history significant for ESRD on HD MWF, s/p Liver transplant for hep C followed at [facility], AS s/p AVR and CABG in 2015 on warfarin who presented with acute worsening of chronic foot pain due to PVD. Her vascular surgeon, Dr. Scot Dock, had planned BKA 3/20, though she became unable to control the pain, described as constant, excruciating and not relieved by oral narcotics at home. On presentation, she was afebrile, heart rate 93, respirations 21, BP 210/88, SpO2 96%. INR 3.0, WBC 8.7, hgb 13.8. The patient was admitted for pain control, coumadin held and started on heparin. A stress myoview was ordered for surgical risk stratification. Vascular surgery  consulted and plans on inpatient BKA once INR drops below 2.0. BKA performed 3/15    Assessment & Plan: Principal Problem:   Claudication of left lower extremity ([place]) Active Problems:   Hypothyroidism   Type 2 diabetes mellitus with chronic kidney disease ([place])   Anemia due to chronic renal failure    End stage renal disease on dialysis [facility])   CAD (coronary artery disease), native coronary artery   History of prosthetic aortic valve replacement   Chronic combined systolic and diastolic congestive heart failure (HCC)   COPD (chronic obstructive pulmonary disease) (HCC)   Chronic anticoagulation w/ coumadin, goal INR 2.5-3.0 w/ AVR   S/P liver transplant ([place])   Foot pain   Critical lower limb ischemia   Acute on chronic peripheral vascular disease with dry gangrene and intractable ischemic pain:    Continue Dilaudid for pain control  Zofran prn nausea  VVS 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 Cleared by cardiology - Strongly urged to quit smoking  ESRD on HD MWF: Sees Dr. Justin Mend as outpatient. - HD per nephrology, - Continue phoslo  Liver transplant: Followed at [facility] hepatology.  - Continue prograf.    transplant hepatologist on-call who reviewed the records, recommends continue tacrolimus at this dose.   Aortic stenosis s/p mechanical AVR: On warfarin.  Per Dr. Evette Georges note, will need bridging.   - needs  heparin gtt when INR less than 2.5,   since she has a [employer] mechanical valve, needs bridging once her Coumadin is held to reduce the likelihood of prosthetic valve thrombosis.  Status post vitamin K 2 mg IV on 3/14, INR normal 1.78 Continue heparin drip until INR greater than 2.5  CAD s/p CABG: Status post Lexiscan Myoview.   - Myoview interpretation per cards-was low risk with anterolateral scar and no ischemia. Deemed low risk to have her BKA  - Continue aspirin, metoprolol  Anemia of chronic disease Hemoglobin stable post surgery   Hypertension:  - Treat pain - Continue metoprolol - HD per nephrology - Hydralazine prn ordered  Hypothyroidism:  - Continue levothyroxine  DVT prophylaxis: Heparin gtt Code Status: DNR Family Communication: None at bedside Disposition Plan:  [place] 3/15, pending establishment of therapeutic INR, possible CIR discharge on Monday  Consultants:   VVS, Dr. Scot Dock  Cardiology  Nephrology  Procedures:   Myoview 3/13    Antimicrobials:  None   Subjective:  denies any chest pain or shortness of breath  Objective: Vitals:  04/25/16 0900 04/25/16 0915 04/25/16 0930 04/25/16 0945  BP: 126/63 100/62 (!) 112/58 113/64  Pulse: 78 78 78 80  Resp:      Temp:      TempSrc:      SpO2:      Weight:      Height:        Intake/Output Summary (Last 24 hours) at 04/25/16 0955 Last data filed at 04/25/16 0616  Gross per 24 hour  Intake           1041.9 ml  Output                10 ml  Net           1031.9 ml   Filed Weights   04/23/16 2100 04/24/16 2140 04/25/16 0700  Weight: 57.4 kg (126 lb 8.7 oz) 56.9 kg (125 lb 7.1 oz) 56.6 kg (124 lb 12.5 oz)   Examination: General exam: 61 y.o. female in evident pain Respiratory system: Non-labored breathing ambient air. Clear to auscultation bilaterally.  Cardiovascular system: Regular rate and rhythm. No murmur, rub, or gallop. No JVD, and no pedal edema. 2+ radial pulses. Absent DP pulses. Left thigh graft +thrill.  Gastrointestinal system: Abdomen soft, non-tender, non-distended, with normoactive bowel sounds. No organomegaly or masses felt. Central nervous system: Alert and oriented. No focal neurological deficits. Extremities: Warm, no deformities Skin: Left heel ulcer ~3cm x 4cm and dry gangrene at toes. Psychiatry: Judgement and insight appear normal. Mood & affect appropriate.   Data Reviewed: I have personally reviewed following labs and imaging studies  CBC:  Recent Labs Lab 04/21/16 2157 04/22/16 0645 04/23/16 0640 04/24/16 0104 04/25/16 0332  WBC 8.7 7.2 8.8 7.6 11.0*  NEUTROABS 3.6  --   --   --   --   HGB 13.8 11.9* 12.2 11.9* 10.3*  HCT 41.4 36.0 35.9* 35.4* 30.2*  MCV 88.7 89.1 86.3 87.2 85.8  PLT 133* 85* 96* 91* 93*   Basic Metabolic Panel:  Recent Labs Lab 04/21/16 2157 04/23/16 1442 04/23/16 1930 04/24/16 0104 04/25/16 0332  NA 137 138  --  133* 134*  K 3.5 3.1*  --  3.6 4.2  CL 97* 97*  --  99* 99*  CO2 29 31  --  25 26  GLUCOSE 99 112*  --  95 198*  BUN 5* 15  --  6 15  CREATININE 3.51* 6.21*  --  3.42* 4.93*  CALCIUM 8.3* 8.2*  --  8.1* 8.0*  MG  --   --  1.9  --   --   PHOS  --  5.0*  --   --   --    GFR: Estimated Creatinine Clearance: 10.6 mL/min (A) (by C-G formula based on SCr of 4.93 mg/dL (H)). Liver Function Tests:  Recent Labs Lab 04/21/16 2157 04/23/16 1442 04/24/16 0104 04/25/16 0332  AST 60*  --  68* 55*  ALT 14  --  17 16  ALKPHOS 226*  --  253*  214*  BILITOT 1.5*  --  1.6* 0.8  PROT 7.5  --  6.1* 5.3*  ALBUMIN 2.3* 1.8* 1.8* 1.5*   No results for input(s): LIPASE, AMYLASE in the last 168 hours. No results for input(s): AMMONIA in the last 168 hours. Coagulation Profile:  Recent Labs Lab 04/22/16 0645 04/22/16 1911 04/23/16 0640 04/24/16 0104 04/25/16 0332  INR 3.12 3.05 3.20 1.67 1.78   Cardiac Enzymes: No results for input(s): CKTOTAL, CKMB, CKMBINDEX, TROPONINI in the  last 168 hours. BNP (last 3 results) No results for input(s): PROBNP in the last 8760 hours. HbA1C: No results for input(s): HGBA1C in the last 72 hours. CBG:  Recent Labs Lab 04/24/16 0816 04/24/16 1009 04/24/16 1133 04/24/16 1527 04/24/16 2139  GLUCAP 75 73 72 82 149*   Lipid Profile: No results for input(s): CHOL, HDL, LDLCALC, TRIG, CHOLHDL, LDLDIRECT in the last 72 hours. Thyroid Function Tests: No results for input(s): TSH, T4TOTAL, FREET4, T3FREE, THYROIDAB in the last 72 hours. Anemia Panel: No results for input(s): VITAMINB12, FOLATE, FERRITIN, TIBC, IRON, RETICCTPCT in the last 72 hours. Urine analysis:    Component Value Date/Time   COLORURINE YELLOW 11/02/2012 0428   APPEARANCEUR CLOUDY (A) 11/02/2012 0428   LABSPEC 1.011 11/02/2012 0428   PHURINE 8.0 11/02/2012 0428   GLUCOSEU 250 (A) 11/02/2012 0428   HGBUR LARGE (A) 11/02/2012 0428   BILIRUBINUR NEGATIVE 11/02/2012 0428   KETONESUR NEGATIVE 11/02/2012 0428   PROTEINUR >300 (A) 11/02/2012 0428   UROBILINOGEN 0.2 11/02/2012 0428   NITRITE NEGATIVE 11/02/2012 0428   LEUKOCYTESUR NEGATIVE 11/02/2012 0428   Recent Results (from the past 240 hour(s))  Surgical pcr screen     Status: None   Collection Time: 04/23/16 10:40 PM  Result Value Ref Range Status   MRSA, PCR NEGATIVE NEGATIVE Final   Staphylococcus aureus NEGATIVE NEGATIVE Final    Comment:        The Xpert SA Assay (FDA approved for NASAL specimens in patients over 52 years of age), is one component of a  comprehensive surveillance program.  Test performance has been validated by [facility] for patients greater than or equal to 34 year old. It is not intended to diagnose infection nor to guide or monitor treatment.       Radiology Studies: No results found.  Scheduled Meds: . aspirin EC  81 mg Oral Daily  . calcitRIOL  0.5 mcg Oral Q M,W,F-HD  . calcium acetate  667 mg Oral TID WC  . cefUROXime (ZINACEF)  IV  1.5 g Intravenous Q12H  . feeding supplement  1 Container Oral TID BM  . feeding supplement (PRO-STAT SUGAR FREE 64)  30 mL Oral BID  . gabapentin  100 mg Oral Q M,W,F-HD  . gabapentin  300 mg Oral QHS  . insulin aspart  0-5 Units Subcutaneous QHS  . insulin aspart  0-9 Units Subcutaneous TID WC  . levothyroxine  175 mcg Oral QAC breakfast  . metoprolol tartrate  25 mg Oral BID  . multivitamin  1 tablet Oral QHS  . pantoprazole  40 mg Oral Daily  . senna-docusate  2 tablet Oral QHS  . tacrolimus  2 mg Oral BID   Continuous Infusions: . heparin 1,400 Units/hr (04/24/16 1909)     LOS: 4 days   Time spent: 25 minutes.  Reyne Dumas, MD [facility] Pager [phone]  If 7PM-7AM, please contact night-coverage [email or web address] Password [facility] 04/25/2016, 9:55 AM

## 2016-04-25 NOTE — Progress Notes (Signed)
OT Cancellation    04/25/16 0800  OT Visit Information  Last OT Received On 04/25/16  Reason Eval/Treat Not Completed Patient at procedure or test/ unavailable (HD. Will check later.)   [facility], OTR/L [phone]

## 2016-04-25 NOTE — Evaluation (Signed)
Occupational Therapy Evaluation Patient Details Name: [name] MRN: [id number] DOB: 07-Sep-1955 Today's Date: 04/25/2016    History of Present Illness  [name] is a 61 y.o. 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   Clinical Impression   PTA, lived with her daughter and was independent in ADLs. Currently, pt able to perform UB ADLs and grooming seated and is Min A for LB ADLs. Pt demonstrates good UB and core strength to maintain both dynamic sitting and static standing (with RW) without physical assistance. Pt educated on sensory stimulation for LLE to reduce phantom limb and pain; pt verbalized understanding. Pt would benefit from continued acute OT to increase pt's independence and safety in ADLs. Recommend dc to CIR to increase occupational performance, functional mobility, and safety.    Follow Up Recommendations  CIR    Equipment Recommendations  Other (comment) (Defer to next venue)    Recommendations for Other Services       Precautions / Restrictions Precautions Precautions: Fall      Mobility Bed Mobility Overal bed mobility: Needs Assistance Bed Mobility: Supine to Sit;Sit to Supine     Supine to sit: Min assist Sit to supine: Min assist   General bed mobility comments: One hand to A getting to EOB  Transfers Overall transfer level: Needs assistance Equipment used: Rolling walker (2 wheeled) Transfers: Sit to/from Stand Sit to Stand: Min assist;From elevated surface         General transfer comment: Cues for hand placement and assist to come both forward and up.    Balance Overall balance assessment: Needs assistance Sitting-balance support: No upper extremity supported Sitting balance-Leahy Scale: Fair     Standing balance support: Bilateral  upper extremity supported Standing balance-Leahy Scale: Fair Standing balance comment: Pt able to maintain standing posture with no physical A and using RW for support                            ADL Overall ADL's : Needs assistance/impaired Eating/Feeding: Supervision/ safety;Set up;Sitting Eating/Feeding Details (indicate cue type and reason): Maintained good posture at EOB to eat Grooming: Set up;Sitting   Upper Body Bathing: Minimal assistance;Sitting   Lower Body Bathing: Minimal assistance;Sit to/from stand   Upper Body Dressing : Minimal assistance;Sitting   Lower Body Dressing: Minimal assistance;Sit to/from stand Lower Body Dressing Details (indicate cue type and reason): Pt donned R sock with supervision               General ADL Comments: Pt demonstrated good core strength and UE strength to perform ADLs seated.     Vision         Perception     Praxis      Pertinent Vitals/Pain Pain Assessment: Faces Pain Score: 10-Worst pain ever (trending down to lower than 7 after IV meds) Faces Pain Scale: Hurts even more Pain Location: L LE Pain Descriptors / Indicators: Aching;Guarding;Moaning;Sore Pain Intervention(s): Monitored during session     Hand Dominance Right   Extremity/Trunk Assessment Upper Extremity Assessment Upper Extremity Assessment: Overall WFL for tasks assessed   Lower Extremity Assessment Lower Extremity Assessment: Overall WFL for tasks assessed RLE Deficits / Details: WFL, with mild generalized weakness LLE Deficits / Details: increased pain, aarom to full flexion and -2  to -3 * extension       Communication Communication Communication: No difficulties   Cognition Arousal/Alertness: Awake/alert;Lethargic Behavior During Therapy: WFL for tasks assessed/performed;Anxious Overall Cognitive Status: Within Functional Limits for tasks assessed                     General Comments  Educated pt on sensory input for  L BKA.     Exercises Exercises: Amputee     Shoulder Instructions      Home Living Family/patient expects to be discharged to:: Private residence Living Arrangements: Children Available Help at Discharge: Family;Available PRN/intermittently (daughter works during which pt in alone.) Type of Home: Mobile home Home Access: Stairs to enter Technical brewer of Steps: 5 Entrance Stairs-Rails: Right;Left;Can reach both Home Layout: One level     Bathroom Shower/Tub: Teacher, early years/pre: Handicapped height Bathroom Accessibility: No   Home Equipment: Bedside commode;Walker - 2 wheels   Additional Comments: pt thinks a w/c will be difficult to maneuver in her mobilie home.      Prior Functioning/Environment Level of Independence: Independent        Comments: Pt reports that her daughter has been helpign her more lately due to LE pain        OT Problem List: Decreased strength;Decreased activity tolerance;Impaired balance (sitting and/or standing);Decreased safety awareness;Decreased knowledge of use of DME or AE;Pain      OT Treatment/Interventions: Self-care/ADL training;Therapeutic exercise;Energy conservation;DME and/or AE instruction;Therapeutic activities;Patient/family education    OT Goals(Current goals can be found in the care plan section) Acute Rehab OT Goals Patient Stated Goal: Get stronger and go home OT Goal Formulation: With patient Time For Goal Achievement: 05/09/16 Potential to Achieve Goals: Good ADL Goals Pt Will Perform Grooming: with min guard assist;standing Pt Will Perform Lower Body Dressing: with min guard assist;sit to/from stand Pt Will Transfer to Toilet: with min guard assist;stand pivot transfer;bedside commode  OT Frequency: Min 2X/week   Barriers to D/C:            Co-evaluation              End of Session Equipment Utilized During Treatment: Gait belt;Rolling walker Nurse Communication: Mobility  status  Activity Tolerance: Patient tolerated treatment well;Patient limited by pain Patient left: in bed;with call bell/phone within reach;with bed alarm set;with family/visitor present;Other (comment) (EOB to eat with family)  OT Visit Diagnosis: Unsteadiness on feet (R26.81);Pain Pain - Right/Left: Left Pain - part of body: Leg                ADL either performed or assessed with clinical judgement  Time: 9449-6759 OT Time Calculation (min): 23 min Charges:  OT General Charges $OT Visit: 1 Procedure OT Evaluation $OT Eval Moderate Complexity: 1 Procedure OT Treatments $Self Care/Home Management : 8-22 mins G-Codes:     [employer], OTR/L [place] 04/25/2016, 3:55 PM

## 2016-04-25 NOTE — Evaluation (Signed)
Physical Therapy Evaluation Patient Details Name: [name] MRN: [id number] DOB: 01-08-1956 Today's Date: 04/25/2016   History of Present Illness   [name] is a 61 y.o. 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  Clinical Impression  Pt admitted with/for L LE pain s/p L BKA.  Pt at a min to mod assist level for bed mobility and standing.  Pt currently limited functionally due to the problems listed. ( See problems list.)   Pt will benefit from PT to maximize function and safety in order to get ready for next venue listed below.     Follow Up Recommendations CIR    Equipment Recommendations  3in1 (PT);Wheelchair (measurements PT);Wheelchair cushion (measurements PT)    Recommendations for Other Services Rehab consult     Precautions / Restrictions Precautions Precautions: Fall      Mobility  Bed Mobility Overal bed mobility: Needs Assistance Bed Mobility: Supine to Sit;Sit to Supine     Supine to sit: Min assist;Mod assist Sit to supine: Min assist   General bed mobility comments: cues for direction and to decr anxiety about mobilty.  Assist to come forward and up.  Transfers Overall transfer level: Needs assistance Equipment used: Rolling walker (2 wheeled) Transfers: Sit to/from Stand Sit to Stand: Mod assist         General transfer comment: Cues for hand placement and assist to come both forward and up.  Ambulation/Gait             General Gait Details: NT due to pain and grogginess  Stairs            Wheelchair Mobility    Modified Rankin (Stroke Patients Only)       Balance Overall balance assessment: Needs assistance Sitting-balance support: No upper extremity supported Sitting balance-Leahy Scale: Fair     Standing balance  support: Bilateral upper extremity supported Standing balance-Leahy Scale: Poor Standing balance comment: reliant on the RW                             Pertinent Vitals/Pain Pain Assessment: 0-10 Pain Score: 10-Worst pain ever (trending down to lower than 7 after IV meds) Pain Location: L LE Pain Descriptors / Indicators: Aching;Guarding;Moaning;Sore Pain Intervention(s): Limited activity within patient's tolerance;Monitored during session;Premedicated before session;Repositioned    Home Living Family/patient expects to be discharged to:: Private residence Living Arrangements: Children Available Help at Discharge: Family;Available PRN/intermittently (daughter works during which pt in alone.) Type of Home: Mobile home Home Access: Stairs to enter Entrance Stairs-Rails: Right;Left;Can reach both Technical brewer of Steps: 5 Home Layout: One level Home Equipment: Bedside commode;Walker - 2 wheels Additional Comments: pt thinks a w/c will be difficult to maneuver in her mobilie home.    Prior Function           Comments: pt states her daughter has had to help her much more lately due to L LE pain.     Hand Dominance   Dominant Hand: Right    Extremity/Trunk Assessment   Upper Extremity Assessment Upper Extremity Assessment: Defer to OT evaluation    Lower Extremity Assessment Lower Extremity Assessment: Overall WFL for tasks assessed;RLE deficits/detail;LLE deficits/detail RLE Deficits / Details: WFL, with mild generalized weakness LLE Deficits / Details: increased pain, aarom  to full flexion and -2 to -3 * extension       Communication   Communication: No difficulties  Cognition Arousal/Alertness: Awake/alert;Lethargic Behavior During Therapy: WFL for tasks assessed/performed;Anxious Overall Cognitive Status: Within Functional Limits for tasks assessed                      General Comments General comments (skin integrity, edema, etc.):  Discussed positioning of her leg in sitting and supine    Exercises Amputee Exercises Hip Extension: AAROM;Left;10 reps;Supine Hip Flexion/Marching: AROM;Strengthening;Left;10 reps;Supine   Assessment/Plan    PT Assessment Patient needs continued PT services  PT Problem List Decreased strength;Decreased range of motion;Decreased activity tolerance;Decreased balance;Decreased mobility;Decreased knowledge of use of DME;Decreased knowledge of precautions;Pain       PT Treatment Interventions DME instruction;Gait training;Functional mobility training;Therapeutic activities;Therapeutic exercise;Balance training;Patient/family education    PT Goals (Current goals can be found in the Care Plan section)  Acute Rehab PT Goals Patient Stated Goal: get prosthesis PT Goal Formulation: With patient Time For Goal Achievement: 05/09/16 Potential to Achieve Goals: Good    Frequency Min 3X/week   Barriers to discharge        Co-evaluation               End of Session   Activity Tolerance: Patient tolerated treatment well;No increased pain Patient left: in bed;with call bell/phone within reach;with bed alarm set Nurse Communication: Mobility status PT Visit Diagnosis: Muscle weakness (generalized) (M62.81);Difficulty in walking, not elsewhere classified (R26.2);Pain Pain - Right/Left: Left Pain - part of body: Leg         Time: 1218-1239 PT Time Calculation (min) (ACUTE ONLY): 21 min   Charges:   PT Evaluation $PT Eval Moderate Complexity: 1 Procedure     PT G CodesTessie Fass Rainn Zupko 04/25/2016, 1:03 PM  04/25/2016  [name], [place] [phone]  (pager)

## 2016-04-25 NOTE — Progress Notes (Signed)
ANTICOAGULATION CONSULT NOTE - Follow Up Consult  Pharmacy Consult for Heparin (warfarin on hold) Indication: AVR  Allergies  Allergen Reactions  . Acetaminophen Other (See Comments)    Liver transplant recipient   . Morphine Itching and Other (See Comments)    hallucinate  . Codeine Itching  . Mirtazapine Other (See Comments)    hallucination   Patient Measurements: Height: 5' 4.5" (163.8 cm) (from 04/17/16) Weight: 119 lb 11.4 oz (54.3 kg) IBW/kg (Calculated) : 55.85  Vital Signs: Temp: 98.6 F (37 C) (03/16 1745) Temp Source: Oral (03/16 1745) BP: 178/61 (03/16 1745) Pulse Rate: 74 (03/16 1745)  Assessment: 61 yo F on Coumadin 2mg  daily PTA for h/o mechanical aortic valve. Goal INR 2.5-3.5. Scheduled for LBKA on 3/20, came to ED 3/12 with severe L foot pain. Warf now on hold, to bridge with IV heparin. 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  Hep lvl now therapeutic  Goal of Therapy:  Heparin level 0.3-0.7 units/ml Monitor platelets by anticoagulation protocol: Yes   Plan:  Continue heparin gtt 1,100 units/hr  Monitor daily heparin level, CBC, s/s of bleed F/U restart of Coumadin  Levester Fresh, PharmD, BCPS, BCCCP Clinical Pharmacist 04/25/2016 9:17 PM

## 2016-04-26 DIAGNOSIS — J410 Simple chronic bronchitis: Secondary | ICD-10-CM

## 2016-04-26 DIAGNOSIS — I5042 Chronic combined systolic (congestive) and diastolic (congestive) heart failure: Secondary | ICD-10-CM

## 2016-04-26 LAB — PROTIME-INR
INR: 1.77
Prothrombin Time: 20.8 seconds — ABNORMAL HIGH (ref 11.4–15.2)

## 2016-04-26 LAB — GLUCOSE, CAPILLARY
Glucose-Capillary: 144 mg/dL — ABNORMAL HIGH (ref 65–99)
Glucose-Capillary: 101 mg/dL — ABNORMAL HIGH (ref 65–99)
Glucose-Capillary: 174 mg/dL — ABNORMAL HIGH (ref 65–99)
Glucose-Capillary: 172 mg/dL — ABNORMAL HIGH (ref 65–99)

## 2016-04-26 LAB — BASIC METABOLIC PANEL
Anion gap: 8 (ref 5–15)
BUN: 13 mg/dL (ref 6–20)
CO2: 26 mmol/L (ref 22–32)
CREATININE: 3.56 mg/dL — AB (ref 0.44–1.00)
Calcium: 8.1 mg/dL — ABNORMAL LOW (ref 8.9–10.3)
Chloride: 97 mmol/L — ABNORMAL LOW (ref 101–111)
GFR calc non Af Amer: 13 mL/min — ABNORMAL LOW (ref >60)
GFR, EST AFRICAN AMERICAN: 15 mL/min — AB (ref >60)
Glucose, Bld: 157 mg/dL — ABNORMAL HIGH (ref 65–99)
Potassium: 3.6 mmol/L (ref 3.5–5.1)
Sodium: 131 mmol/L — ABNORMAL LOW (ref 135–145)

## 2016-04-26 LAB — CBC
HCT: 28.2 % — ABNORMAL LOW (ref 36.0–46.0)
HEMOGLOBIN: 9.6 g/dL — AB (ref 12.0–15.0)
MCH: 28.8 pg (ref 26.0–34.0)
MCHC: 34 g/dL (ref 30.0–36.0)
MCV: 84.7 fL (ref 78.0–100.0)
Platelets: 89 10*3/uL — ABNORMAL LOW (ref 150–400)
RBC: 3.33 MIL/uL — ABNORMAL LOW (ref 3.87–5.11)
RDW: 16.5 % — ABNORMAL HIGH (ref 11.5–15.5)
WBC: 14.1 10*3/uL — ABNORMAL HIGH (ref 4.0–10.5)

## 2016-04-26 LAB — HEPARIN LEVEL (UNFRACTIONATED)
HEPARIN UNFRACTIONATED: 0.41 [IU]/mL (ref 0.30–0.70)
Heparin Unfractionated: 0.25 IU/mL — ABNORMAL LOW (ref 0.30–0.70)

## 2016-04-26 MED ORDER — DARBEPOETIN ALFA 100 MCG/0.5ML IJ SOSY
100.0000 ug | PREFILLED_SYRINGE | INTRAMUSCULAR | Status: DC
  Administered 2016-04-28: 100 ug via INTRAVENOUS
  Filled 2016-04-26: qty 0.5

## 2016-04-26 NOTE — Progress Notes (Signed)
PROGRESS NOTE  [name]  EYC:[id number] DOB: [birth year] DOA: 04/21/2016 PCP: No primary care provider on file.  Outpatient Specialists: [facility] Hepatology [facility], Dr. Scot Dock  Brief Narrative: [name] is a 61 y.o. female with a past medical history significant for ESRD on HD MWF, s/p Liver transplant for hep C followed at [facility], [place] s/p AVR and CABG in 2015 on warfarin who presented with acute worsening of chronic foot pain due to PVD. Her vascular surgeon, Dr. Scot Dock, had planned BKA 3/20, though she became unable to control the pain, described as constant, excruciating and not relieved by oral narcotics at home. On presentation, she was afebrile, heart rate 93, respirations 21, BP 210/88, SpO2 96%. INR 3.0, WBC 8.7, hgb 13.8. The patient was admitted for pain control, coumadin held and started on heparin. A stress myoview was ordered for surgical risk stratification. Vascular surgery  consulted and plans on inpatient BKA once INR drops below 2.0. BKA performed 3/15    Assessment & Plan: Principal Problem:   Claudication of left lower extremity ([place]) Active Problems:   Hypothyroidism   Type 2 diabetes mellitus with chronic kidney disease ([place])   Anemia due to chronic renal failure    End stage renal disease on dialysis (HCC)   CAD (coronary artery disease), native coronary artery   History of prosthetic aortic valve replacement   Chronic combined systolic and diastolic congestive heart failure (HCC)   COPD (chronic obstructive pulmonary disease) (HCC)   Chronic anticoagulation w/ coumadin, goal INR 2.5-3.0 w/ AVR   S/P liver transplant ([place])   Foot pain   Critical lower limb ischemia   Acute on chronic peripheral vascular disease with dry gangrene and intractable ischemic pain:  status post BKA    Continue Dilaudid for pain control  Zofran prn nausea  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 Cleared by cardiology  Mild oozing from left BKA but hemoglobin stable  ESRD on HD MWF: Sees Dr. Justin Mend as outpatient. - HD per nephrology, - Continue phoslo  Liver transplant: Followed at [facility] hepatology.  - Continue prograf.    transplant hepatologist on-call who reviewed the records, recommends continue tacrolimus at this dose.   Aortic stenosis s/p mechanical AVR: On warfarin.  Per Dr. Evette Georges note, will need bridging.   - needs  heparin gtt when INR less than 2.5,   since she has a [employer] mechanical valve, needs bridging once her Coumadin is held to reduce the likelihood of prosthetic valve thrombosis.  Status post vitamin K 2 mg IV on 3/14, INR normal 1.78 Continue heparin drip until INR greater than 2.5   CAD s/p CABG: Status post Lexiscan Myoview.   - Myoview  was low risk with anterolateral scar and no ischemia. Deemed low risk to have her BKA  - Continue aspirin, metoprolol  Anemia of chronic disease Hemoglobin has drifted down from 12.2> 9.6 Continue to monitor, transfuse for hemoglobin less than 8.0   Hypertension:  - Treat pain - Continue metoprolol - HD per nephrology - Hydralazine prn ordered  Hypothyroidism:  - Continue levothyroxine  DVT prophylaxis: Heparin gtt Code Status: DNR Family Communication: None at bedside Disposition Plan:  [place] 3/15, pending establishment of therapeutic INR, possible CIR discharge on Monday  Consultants:   VVS, Dr. Scot Dock  Cardiology  Nephrology  Procedures:   [facility] 3/13  Antimicrobials:  None   Subjective: Lot of pain at amputation site   Objective: Vitals:   04/25/16 1049 04/25/16 1745 04/25/16 2136 04/26/16 0546  BP: 119/73 (!) 178/61 (!) 155/59 (!) 132/58  Pulse: 81 74 79 81  Resp: 18 18 19 20   Temp: 98.2 F (36.8 C) 98.6 F (37 C) 98.7 F (37.1 C) 100.3 F (37.9 C)  TempSrc: Oral Oral Oral Oral  SpO2: 98% 99% 96% 95%  Weight: 54.3 kg (119 lb 11.4  oz)  54.4 kg (119 lb 14.9 oz)   Height:        Intake/Output Summary (Last 24 hours) at 04/26/16 0843 Last data filed at 04/26/16 0653  Gross per 24 hour  Intake            316.3 ml  Output             1000 ml  Net           -683.7 ml   Filed Weights   04/25/16 0700 04/25/16 1049 04/25/16 2136  Weight: 56.6 kg (124 lb 12.5 oz) 54.3 kg (119 lb 11.4 oz) 54.4 kg (119 lb 14.9 oz)   Examination: General exam: 61 y.o. female in evident pain Respiratory system: Non-labored breathing ambient air. Clear to auscultation bilaterally.  Cardiovascular system: Regular rate and rhythm. No murmur, rub, or gallop. No JVD, and no pedal edema. 2+ radial pulses. Absent DP pulses. Left thigh graft +thrill.  Gastrointestinal system: Abdomen soft, non-tender, non-distended, with normoactive bowel sounds. No organomegaly or masses felt. Central nervous system: Alert and oriented. No focal neurological deficits. Extremities:  s/p: left BKA Psychiatry: Judgement and insight appear normal. Mood & affect appropriate.   Data Reviewed: I have personally reviewed following labs and imaging studies  CBC:  Recent Labs Lab 04/21/16 2157  04/23/16 0640 04/24/16 0104 04/25/16 0332 04/25/16 2038 04/26/16 0531  WBC 8.7  < > 8.8 7.6 11.0* 11.6* 14.1*  NEUTROABS 3.6  --   --   --   --   --   --   HGB 13.8  < > 12.2 11.9* 10.3* 10.2* 9.6*  HCT 41.4  < > 35.9* 35.4* 30.2* 30.1* 28.2*  MCV 88.7  < > 86.3 87.2 85.8 86.0 84.7  PLT 133*  < > 96* 91* 93* 95* 89*  < > = values in this interval not displayed. Basic Metabolic Panel:  Recent Labs Lab 04/21/16 2157 04/23/16 1442 04/23/16 1930 04/24/16 0104 04/25/16 0332 04/26/16 0531  NA 137 138  --  133* 134* 131*  K 3.5 3.1*  --  3.6 4.2 3.6  CL 97* 97*  --  99* 99* 97*  CO2 29 31  --  25 26 26   GLUCOSE 99 112*  --  95 198* 157*  BUN 5* 15  --  6 15 13   CREATININE 3.51* 6.21*  --  3.42* 4.93* 3.56*  CALCIUM 8.3* 8.2*  --  8.1* 8.0* 8.1*  MG  --   --  1.9   --   --   --   PHOS  --  5.0*  --   --   --   --    GFR: Estimated Creatinine Clearance: 14.3 mL/min (A) (by C-G formula based on SCr of 3.56 mg/dL (H)). Liver Function Tests:  Recent Labs Lab 04/21/16 2157 04/23/16 1442 04/24/16 0104 04/25/16 0332  AST 60*  --  68* 55*  ALT 14  --  17 16  ALKPHOS 226*  --  253*  214*  BILITOT 1.5*  --  1.6* 0.8  PROT 7.5  --  6.1* 5.3*  ALBUMIN 2.3* 1.8* 1.8* 1.5*   No results for input(s): LIPASE, AMYLASE in the last 168 hours. No results for input(s): AMMONIA in the last 168 hours. Coagulation Profile:  Recent Labs Lab 04/22/16 1911 04/23/16 0640 04/24/16 0104 04/25/16 0332 04/26/16 0531  INR 3.05 3.20 1.67 1.78 1.77   Cardiac Enzymes: No results for input(s): CKTOTAL, CKMB, CKMBINDEX, TROPONINI in the last 168 hours. BNP (last 3 results) No results for input(s): PROBNP in the last 8760 hours. HbA1C: No results for input(s): HGBA1C in the last 72 hours. CBG:  Recent Labs Lab 04/24/16 2139 04/25/16 1123 04/25/16 1555 04/25/16 2131 04/26/16 0736  GLUCAP 149* 80 115* 180* 144*   Lipid Profile: No results for input(s): CHOL, HDL, LDLCALC, TRIG, CHOLHDL, LDLDIRECT in the last 72 hours. Thyroid Function Tests: No results for input(s): TSH, T4TOTAL, FREET4, T3FREE, THYROIDAB in the last 72 hours. Anemia Panel: No results for input(s): VITAMINB12, FOLATE, FERRITIN, TIBC, IRON, RETICCTPCT in the last 72 hours. Urine analysis:    Component Value Date/Time   COLORURINE YELLOW 11/02/2012 0428   APPEARANCEUR CLOUDY (A) 11/02/2012 0428   LABSPEC 1.011 11/02/2012 0428   PHURINE 8.0 11/02/2012 0428   GLUCOSEU 250 (A) 11/02/2012 0428   HGBUR LARGE (A) 11/02/2012 0428   BILIRUBINUR NEGATIVE 11/02/2012 0428   KETONESUR NEGATIVE 11/02/2012 0428   PROTEINUR >300 (A) 11/02/2012 0428   UROBILINOGEN 0.2 11/02/2012 0428   NITRITE NEGATIVE 11/02/2012 0428   LEUKOCYTESUR NEGATIVE 11/02/2012 0428   Recent Results (from the past 240  hour(s))  Surgical pcr screen     Status: None   Collection Time: 04/23/16 10:40 PM  Result Value Ref Range Status   MRSA, PCR NEGATIVE NEGATIVE Final   Staphylococcus aureus NEGATIVE NEGATIVE Final    Comment:        The Xpert SA Assay (FDA approved for NASAL specimens in patients over 63 years of age), is one component of a comprehensive surveillance program.  Test performance has been validated by [facility] for patients greater than or equal to 31 year old. It is not intended to diagnose infection nor to guide or monitor treatment.       Radiology Studies: No results found.  Scheduled Meds: . aspirin EC  81 mg Oral Daily  . calcitRIOL  0.5 mcg Oral Q M,W,F-HD  . calcium acetate  667 mg Oral TID WC  . feeding supplement  1 Container Oral TID BM  . feeding supplement (PRO-STAT SUGAR FREE 64)  30 mL Oral BID  . gabapentin  100 mg Oral Q M,W,F-HD  . gabapentin  300 mg Oral QHS  . insulin aspart  0-5 Units Subcutaneous QHS  . insulin aspart  0-9 Units Subcutaneous TID WC  . levothyroxine  175 mcg Oral QAC breakfast  . metoprolol tartrate  25 mg Oral BID  . multivitamin  1 tablet Oral QHS  . pantoprazole  40 mg Oral Daily  . senna-docusate  2 tablet Oral QHS  . tacrolimus  2 mg Oral BID   Continuous Infusions: . heparin 1,200 Units/hr (04/26/16 0743)     LOS: 5 days   Time spent: 25 minutes.  Reyne Dumas, MD [facility] Pager [phone]  If 7PM-7AM, please contact night-coverage [email or web address] Password [facility] 04/26/2016, 8:43 AM

## 2016-04-26 NOTE — Progress Notes (Signed)
ANTICOAGULATION CONSULT NOTE - Follow Up Consult  Pharmacy Consult for Heparin (warfarin on hold) Indication: AVR  Allergies  Allergen Reactions  . Acetaminophen Other (See Comments)    Liver transplant recipient   . Morphine Itching and Other (See Comments)    hallucinate  . Codeine Itching  . Mirtazapine Other (See Comments)    hallucination   Patient Measurements: Height: 5' 4.5" (163.8 cm) (from 04/17/16) Weight: 119 lb 14.9 oz (54.4 kg) IBW/kg (Calculated) : 55.85  Vital Signs: Temp: 100.3 F (37.9 C) (03/17 0546) Temp Source: Oral (03/17 0546) BP: 132/58 (03/17 0546) Pulse Rate: 81 (03/17 0546)  Assessment: 61 yo F on Coumadin 2mg  daily PTA for h/o mechanical aortic valve. Goal INR 2.5-3.5. Scheduled for LBKA on 3/20, came to ED 3/12 with severe L foot pain. Warf now on hold, to bridge with IV heparin. 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  Heparin level this morning was subtherapeutic at 0.25 units/mL. Hemoglobin and platelet count dropped slightly since last night. Moderate oozing from L BKA noted yesterday - will not re-bolus for this reason. RN said this morning that wound dressing was dry and no bleeding noted.   Goal of Therapy:  Heparin level 0.3-0.7 units/ml Monitor platelets by anticoagulation protocol: Yes   Plan:  Increase heparin to 1200 units/hr IV Confirmatory heparin level in 8 hours Monitor daily heparin level, CBC, s/s of bleed F/U restart of Coumadin - per VS note probably [place], PharmD Acute Care Pharmacy Resident  Pager: [phone] 04/26/2016

## 2016-04-26 NOTE — Progress Notes (Signed)
[facility] Progress Note   Dialysis Orders: [facility] MWF EDW 58kg Time 3hrs 7min 2/2 bath No heparin  Calcitriol 0.11mcg PO q HD Not currently on ESA - Mircera 123mcg IV last dosed 2/228  OP Labs: hgb 11.8 Tsat 32% P 5.3 PTH 573   Assessment/Plan: 1. Left BKA 3/15 - post op temp- WBC up to 14 K having some psychological adjustment as well as pain 2. ESRD -MWF - next HD Monday K 3.6 HD yesterday with 4 K bath - next HD Monday 3. Anemia - hgb down post op to 9.6 - resume ESA Monday 4. Secondary hyperparathyroidism - on calcitriol/binders 5. HTN/volume - net UF 1 L on Friday - volume down more on Monday 6. Nutrition - diet liberalized to regular/added Resource - dislikes nepro/vits/prostat alb very low at 1.5- staff feeding 7. Thrombocytopenia - follow trends- she has been on no heparin HD - plts 89K 8. s/p AVR - on coumadin  9.s/p liver tx - prograf- watch LFTs, total bili down to 0.8 10. DM -BS ok   Myriam Jacobson, PA-C [facility] Beeper [phone] 04/26/2016,9:07 AM  LOS: 5 days   Subjective:   c/o pain in BKA - took dressing off herself  Objective Vitals:   04/25/16 1745 04/25/16 2136 04/26/16 0546 04/26/16 0849  BP: (!) 178/61 (!) 155/59 (!) 132/58 (!) 152/61  Pulse: 74 79 81 79  Resp: 18 19 20 20   Temp: 98.6 F (37 C) 98.7 F (37.1 C) 100.3 F (37.9 C) 98.2 F (36.8 C)  TempSrc: Oral Oral Oral Oral  SpO2: 99% 96% 95% 96%  Weight:  54.4 kg (119 lb 14.9 oz)    Height:       Physical Exam being fed breakfast General: tearful Heart: RRR + valve click Lungs: no rales Abdomen: soft NT Extremities:left BKA unwrapped- wounds ok - slightly puffy Dialysis Access: right IJ   Additional Objective Labs: Basic Metabolic Panel:  Recent Labs Lab 04/23/16 1442 04/24/16 0104 04/25/16 0332 04/26/16 0531  NA 138 133* 134* 131*  K 3.1* 3.6 4.2 3.6  CL 97* 99* 99* 97*  CO2 31 25 26 26   GLUCOSE 112* 95 198* 157*  BUN 15 6 15 13    CREATININE 6.21* 3.42* 4.93* 3.56*  CALCIUM 8.2* 8.1* 8.0* 8.1*  PHOS 5.0*  --   --   --    Liver Function Tests:  Recent Labs Lab 04/21/16 2157 04/23/16 1442 04/24/16 0104 04/25/16 0332  AST 60*  --  68* 55*  ALT 14  --  17 16  ALKPHOS 226*  --  253* 214*  BILITOT 1.5*  --  1.6* 0.8  PROT 7.5  --  6.1* 5.3*  ALBUMIN 2.3* 1.8* 1.8* 1.5*   CBC:  Recent Labs Lab 04/21/16 2157  04/23/16 0640 04/24/16 0104 04/25/16 0332 04/25/16 2038 04/26/16 0531  WBC 8.7  < > 8.8 7.6 11.0* 11.6* 14.1*  NEUTROABS 3.6  --   --   --   --   --   --   HGB 13.8  < > 12.2 11.9* 10.3* 10.2* 9.6*  HCT 41.4  < > 35.9* 35.4* 30.2* 30.1* 28.2*  MCV 88.7  < > 86.3 87.2 85.8 86.0 84.7  PLT 133*  < > 96* 91* 93* 95* 89*  < > = values in this interval not displayed. CBG:  Recent Labs Lab 04/24/16 2139 04/25/16 1123 04/25/16 1555 04/25/16 2131 04/26/16 0736  GLUCAP 149* 80 115* 180* 144*  Lab Results  Component Value Date   INR 1.77 04/26/2016   INR 1.78 04/25/2016   INR 1.67 04/24/2016  Medications: . heparin 1,200 Units/hr (04/26/16 0743)   . aspirin EC  81 mg Oral Daily  . calcitRIOL  0.5 mcg Oral Q M,W,F-HD  . calcium acetate  667 mg Oral TID WC  . feeding supplement  1 Container Oral TID BM  . feeding supplement (PRO-STAT SUGAR FREE 64)  30 mL Oral BID  . gabapentin  100 mg Oral Q M,W,F-HD  . gabapentin  300 mg Oral QHS  . insulin aspart  0-5 Units Subcutaneous QHS  . insulin aspart  0-9 Units Subcutaneous TID WC  . levothyroxine  175 mcg Oral QAC breakfast  . metoprolol tartrate  25 mg Oral BID  . multivitamin  1 tablet Oral QHS  . pantoprazole  40 mg Oral Daily  . senna-docusate  2 tablet Oral QHS  . tacrolimus  2 mg Oral BID      I have seen and examined this patient and agree with plan and assessment in the above note with renal recommendations/intervention highlighted. Pain control is main issue at this time.  She is crying for more medication and asking to see Dr.  Collene Leyden A Jolena Kittle,MD 04/26/2016 11:53 AM

## 2016-04-26 NOTE — Progress Notes (Signed)
Physical Therapy Treatment Patient Details Name: LEMMIE STEINHAUS MRN: [id number] DOB: 09-10-1955 Today's Date: 04/26/2016    History of Present Illness  ROSAMOND ANDRESS is a 61 y.o. 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    PT Comments    Patient limited by pain.  Was able to sit EOB and stand.  Instructed patient and daughters on Lt knee extension exercise.  Agree with need for CIR at discharge.   Follow Up Recommendations  CIR     Equipment Recommendations  3in1 (PT);Wheelchair (measurements PT);Wheelchair cushion (measurements PT)    Recommendations for Other Services Rehab consult     Precautions / Restrictions Precautions Precautions: Fall Restrictions Weight Bearing Restrictions: Yes LLE Weight Bearing: Non weight bearing    Mobility  Bed Mobility Overal bed mobility: Needs Assistance Bed Mobility: Supine to Sit     Supine to sit: Mod assist     General bed mobility comments: Patient reporting significant pain, and crying.  Required mod assist to bring trunk to upright sitting position.  In sitting, had patient perform Lt knee extension x5.  Patient able to initiate movement, but could not actively move to full extension.  Encouraged patient and daughters to do this exercise during day.  Transfers Overall transfer level: Needs assistance Equipment used: Rolling walker (2 wheeled) Transfers: Sit to/from Stand Sit to Stand: Mod assist         General transfer comment: Cues for hand placement and assist to come both forward and up.  Patient able to stand only 30 seconds and had to return to sitting due to pain.  Ambulation/Gait             General Gait Details: NT - patient unable due to pain   Stairs            Wheelchair Mobility    Modified  Rankin (Stroke Patients Only)       Balance Overall balance assessment: Needs assistance Sitting-balance support: No upper extremity supported Sitting balance-Leahy Scale: Fair     Standing balance support: Bilateral upper extremity supported Standing balance-Leahy Scale: Poor Standing balance comment: Patient required UE support for balance in standing.                    Cognition Arousal/Alertness: Awake/alert Behavior During Therapy: Anxious;Flat affect Overall Cognitive Status: Within Functional Limits for tasks assessed                      Exercises Amputee Exercises Knee Extension: AROM;Left;5 reps;Seated    General Comments        Pertinent Vitals/Pain Pain Assessment: Faces Faces Pain Scale: Hurts worst Pain Location: L LE Pain Descriptors / Indicators: Burning;Crying;Grimacing;Guarding (Stinging) Pain Intervention(s): Limited activity within patient's tolerance;Monitored during session;Repositioned    Home Living                      Prior Function            PT Goals (current goals can now be found in the care plan section) Acute Rehab PT Goals Patient Stated Goal: Get stronger and go home Progress towards PT goals: Not progressing toward goals - comment (Slow progress due to pain)    Frequency    Min 3X/week  PT Plan Current plan remains appropriate    Co-evaluation             End of Session Equipment Utilized During Treatment: Gait belt Activity Tolerance: Patient limited by pain Patient left: in bed;with call bell/phone within reach;with bed alarm set;with family/visitor present (sitting EOB with alarm on and family in room)   PT Visit Diagnosis: Muscle weakness (generalized) (M62.81);Difficulty in walking, not elsewhere classified (R26.2);Pain Pain - Right/Left: Left Pain - part of body: Leg     Time: 4801-6553 PT Time Calculation (min) (ACUTE ONLY): 10 min  Charges:  $Therapeutic Activity: 8-22  mins                    G Codes:       Despina Pole 2016/05/08, 4:55 PM Carita Pian. Sanjuana Kava, [place] Pager [phone]

## 2016-04-26 NOTE — Progress Notes (Signed)
ANTICOAGULATION CONSULT NOTE - Follow Up Consult  Pharmacy Consult for Heparin (warfarin on hold) Indication: AVR  Allergies  Allergen Reactions  . Acetaminophen Other (See Comments)    Liver transplant recipient   . Morphine Itching and Other (See Comments)    hallucinate  . Codeine Itching  . Mirtazapine Other (See Comments)    hallucination   Patient Measurements: Height: 5' 4.5" (163.8 cm) (from 04/17/16) Weight: 119 lb 14.9 oz (54.4 kg) IBW/kg (Calculated) : 55.85  Vital Signs: Temp: 98.2 F (36.8 C) (03/17 0849) Temp Source: Oral (03/17 0849) BP: 152/61 (03/17 0849) Pulse Rate: 79 (03/17 0849)  Assessment: 61 yo F on Coumadin 2mg  daily PTA for h/o mechanical aortic valve. Goal INR 2.5-3.5. Scheduled for LBKA on 3/20, came to ED 3/12 with severe L foot pain. Warf now on hold, to bridge with IV heparin. 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  Heparin level therapeutic at 0.41 after rate increase to heparin at 1200 units/h. Hemoglobin and platelet count dropped slightly since last night. Moderate oozing from L BKA noted yesterday. No further bleeding noted.  Goal of Therapy:  Heparin level 0.3-0.7 units/ml Monitor platelets by anticoagulation protocol: Yes   Plan:  Heparin at 1200 units/hr IV Confirmatory heparin level in 8 hours Monitor daily heparin level, CBC, s/s of bleed F/U restart of Coumadin - per VS note probably tonight   Elicia Lamp, PharmD, BCPS Clinical Pharmacist 04/26/2016 4:29 PM

## 2016-04-26 NOTE — Progress Notes (Addendum)
  [facility] Progress Note  Subjective  - POD #2  Left BKA painful.   Objective Vitals:   04/26/16 0546 04/26/16 0849  BP: (!) 132/58 (!) 152/61  Pulse: 81 79  Resp: 20 20  Temp: 100.3 F (37.9 C) 98.2 F (36.8 C)    Intake/Output Summary (Last 24 hours) at 04/26/16 1011 Last data filed at 04/26/16 0653  Gross per 24 hour  Intake            316.3 ml  Output             1000 ml  Net           -683.7 ml   Mild active oozing left BKA Sanguinous drainage on dressing Staples intact with viable skin edges.   Assessment/Planning: 61 y.o. female is s/p: left BKA 2 Days Post-Op   Left BKA healing well. Mild oozing on heparin.  Slowly start coumadin tonight.   Alvia Grove 04/26/2016 10:11 AM --  Laboratory CBC    Component Value Date/Time   WBC 14.1 (H) 04/26/2016 0531   HGB 9.6 (L) 04/26/2016 0531   HCT 28.2 (L) 04/26/2016 0531   PLT 89 (L) 04/26/2016 0531    BMET    Component Value Date/Time   NA 131 (L) 04/26/2016 0531   K 3.6 04/26/2016 0531   CL 97 (L) 04/26/2016 0531   CO2 26 04/26/2016 0531   GLUCOSE 157 (H) 04/26/2016 0531   BUN 13 04/26/2016 0531   CREATININE 3.56 (H) 04/26/2016 0531   CREATININE 6.42 (H) 01/17/2016 1233   CALCIUM 8.1 (L) 04/26/2016 0531   GFRNONAA 13 (L) 04/26/2016 0531   GFRAA 15 (L) 04/26/2016 0531    COAG Lab Results  Component Value Date   INR 1.77 04/26/2016   INR 1.78 04/25/2016   INR 1.67 04/24/2016   No results found for: PTT  Antibiotics Anti-infectives    Start     Dose/Rate Route Frequency Ordered Stop   04/24/16 2130  cefUROXime (ZINACEF) 1.5 g in dextrose 5 % 50 mL IVPB     1.5 g 100 mL/hr over 30 Minutes Intravenous Every 12 hours 04/24/16 1236 04/25/16 1154   04/24/16 0913  cefUROXime (ZINACEF) 1.5 g in dextrose 5 % 50 mL IVPB     1.5 g 100 mL/hr over 30 Minutes Intravenous 30 min pre-op 04/24/16 0913 04/24/16 1054       Virgina Jock, PA-C [facility] Office: [phone] Pager: [phone] 04/26/2016 10:11 AM  I agree with the above.  I have seen and evaluated the patient.  She is complaining of pain in her left below-knee amputation stump.  Her dressing was changed today.  The skin appears to be healthy.  [name]

## 2016-04-27 LAB — HEPARIN LEVEL (UNFRACTIONATED)
Heparin Unfractionated: 0.28 IU/mL — ABNORMAL LOW (ref 0.30–0.70)
Heparin Unfractionated: 0.45 IU/mL (ref 0.30–0.70)

## 2016-04-27 LAB — GLUCOSE, CAPILLARY
GLUCOSE-CAPILLARY: 139 mg/dL — AB (ref 65–99)
Glucose-Capillary: 157 mg/dL — ABNORMAL HIGH (ref 65–99)
Glucose-Capillary: 148 mg/dL — ABNORMAL HIGH (ref 65–99)
Glucose-Capillary: 193 mg/dL — ABNORMAL HIGH (ref 65–99)
Glucose-Capillary: 154 mg/dL — ABNORMAL HIGH (ref 65–99)

## 2016-04-27 LAB — BASIC METABOLIC PANEL
Anion gap: 11 (ref 5–15)
BUN: 25 mg/dL — AB (ref 6–20)
CALCIUM: 8.3 mg/dL — AB (ref 8.9–10.3)
CO2: 24 mmol/L (ref 22–32)
Chloride: 97 mmol/L — ABNORMAL LOW (ref 101–111)
Creatinine, Ser: 5.13 mg/dL — ABNORMAL HIGH (ref 0.44–1.00)
GFR calc Af Amer: 10 mL/min — ABNORMAL LOW (ref >60)
GFR, EST NON AFRICAN AMERICAN: 8 mL/min — AB (ref >60)
Glucose, Bld: 144 mg/dL — ABNORMAL HIGH (ref 65–99)
Potassium: 3.8 mmol/L (ref 3.5–5.1)
Sodium: 132 mmol/L — ABNORMAL LOW (ref 135–145)

## 2016-04-27 LAB — CBC
HCT: 27.4 % — ABNORMAL LOW (ref 36.0–46.0)
Hemoglobin: 9.3 g/dL — ABNORMAL LOW (ref 12.0–15.0)
MCH: 29 pg (ref 26.0–34.0)
MCHC: 33.9 g/dL (ref 30.0–36.0)
MCV: 85.4 fL (ref 78.0–100.0)
PLATELETS: 124 10*3/uL — AB (ref 150–400)
RBC: 3.21 MIL/uL — ABNORMAL LOW (ref 3.87–5.11)
RDW: 16.7 % — AB (ref 11.5–15.5)
WBC: 14.9 10*3/uL — AB (ref 4.0–10.5)

## 2016-04-27 LAB — PROTIME-INR
INR: 1.8
Prothrombin Time: 21.1 seconds — ABNORMAL HIGH (ref 11.4–15.2)

## 2016-04-27 MED ORDER — WARFARIN SODIUM 3 MG PO TABS
3.0000 mg | ORAL_TABLET | Freq: Once | ORAL | Status: AC
  Administered 2016-04-27: 3 mg via ORAL
  Filled 2016-04-27 (×3): qty 1

## 2016-04-27 MED ORDER — WARFARIN - PHARMACIST DOSING INPATIENT
Freq: Every day | Status: DC
  Administered 2016-04-29: 18:00:00

## 2016-04-27 MED ORDER — OXYCODONE HCL ER 10 MG PO T12A
10.0000 mg | EXTENDED_RELEASE_TABLET | ORAL | Status: DC
Start: 2016-04-27 — End: 2016-04-28
  Administered 2016-04-27: 10 mg via ORAL
  Filled 2016-04-27 (×2): qty 1

## 2016-04-27 NOTE — Progress Notes (Signed)
Pt called wanting pain medication at 0550. This nurse took pt an Oxycodone and was about to admin it when pt went unresponsive. She said "Oh Jesus" and went limp in the bed. This nurse tried arousing pt and she would not respond. Code and Rapid was called. Pt is a DNR. Vitals: 132/64, P87, O2 93% on RA and temp. 98.8. Pt did have a pulse. Pt spontaneously arouse and took a deep breath and started back c/o pain in leg. Schorr NP made aware. Pt resting comfortably in bed at this time. Will continue to monitor.   Eleanora Neighbor, RN

## 2016-04-27 NOTE — Progress Notes (Addendum)
ANTICOAGULATION CONSULT NOTE - Follow Up Consult  Pharmacy Consult for Heparin bridge to Warfarin Indication: AVR  Allergies  Allergen Reactions  . Acetaminophen Other (See Comments)    Liver transplant recipient   . Morphine Itching and Other (See Comments)    hallucinate  . Codeine Itching  . Mirtazapine Other (See Comments)    hallucination   Patient Measurements: Height: 5' 4.5" (163.8 cm) (from 04/17/16) Weight: 121 lb 4.1 oz (55 kg) IBW/kg (Calculated) : 55.85  Vital Signs: Temp: 99.6 F (37.6 C) (03/18 0900) Temp Source: Oral (03/18 0900) BP: 123/47 (03/18 0900) Pulse Rate: 79 (03/18 0900)  Assessment: 61 yo F on Coumadin 2mg  daily PTA for h/o mechanical aortic valve. Goal INR 2.5-3.5. Patient came to ED 3/12 with severe L foot pain. 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 Patient was changed to heparin on admission, now restarting warfarin with heparin bridge.   Daily/confirmatory heparin level therapeutic at 0.45. Hemoglobin is low/stable and platelet count low but increased. Mild oozing from L BKA noted yesterday. No further bleeding noted.  INR today is subtherapeutic but elevated at 1.8. PTA warfarin dose is 2mg  daily. Will give small boost dose tonight in setting of recent procedure and still elevated INR.   Goal of Therapy:  Heparin level 0.3-0.7 units/ml  INR 2.5-3.5 Monitor platelets by anticoagulation protocol: Yes   Plan:  Heparin at 1300 units/hr IV  Warfarin 3mg  PO x1 tonight Monitor daily heparin level, INR, CBC, s/s of bleed  Demetrius Charity, PharmD Acute Care Pharmacy Resident  Pager: [phone] 04/27/2016

## 2016-04-27 NOTE — Progress Notes (Signed)
[facility] Progress Note   Dialysis Orders: [place] MWF EDW 58kg Time 3hrs 43min 2/2 bath No heparin  Calcitriol 0.15mcg PO q HD Not currently on ESA - Mircera 198mcg IV last dosed 2/228  OP Labs: hgb 11.8 Tsat 32% P 5.3 PTH 573   Assessment/Plan: 1. 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 ESRD -MWF -  K 3.8  Use 4 K bath - next HD Monday 3. Anemia - hgb down post op to 9.3 - resume ESA Monday 4. Secondary hyperparathyroidism - on calcitriol/binders 5. HTN/volume - net UF 1 L on Friday - volume down more on Monday - new EDW for d/c  6. Nutrition - diet liberalized to regular/added Resource - dislikes nepro/vits/prostat alb very low at 1.5- staff feeding at times though she can feed herself - intake poor 7. Thrombocytopenia - follow trends- she has been on no heparin HD - plts 89K up to 124 K Sunday 8. s/p AVR - on coumadin  9.s/p liver tx - prograf- watch LFTs, total bili down to 0.8 10. DM -BS ok   Myriam Jacobson, PA-C [place] [phone] 04/27/2016,8:35 AM  LOS: 6 days   Subjective:   Brief episode of unresponsiveness at 6;38 am -see RN note.  Not eating b/c she doesn't like food.   Objective Vitals:   04/26/16 1724 04/26/16 2027 04/27/16 0407 04/27/16 0600  BP: (!) 135/56 (!) 108/57 (!) 150/63 132/64  Pulse: 80 76 76 87  Resp: 19 15 16    Temp: 98.6 F (37 C) 98.7 F (37.1 C) 98.9 F (37.2 C) 98.8 F (37.1 C)  TempSrc: Oral   Oral  SpO2: 98% 95% 100% 93%  Weight:  55 kg (121 lb 4.1 oz)    Height:       Physical Exam General: looks better today more engaging Heart: RRR + valve click Lungs: no rales Abdomen: active BS soft NT Extremities: left BKA wrapped no RLE edema Dialysis Access:  Right IJ [facility]   Additional Objective Labs: Basic Metabolic Panel:  Recent Labs Lab 04/23/16 1442  04/25/16 0332 04/26/16 0531 04/27/16 0541  NA 138  < > 134* 131* 132*  K 3.1*  < > 4.2 3.6 3.8  CL 97*  < > 99* 97*  97*  CO2 31  < > 26 26 24   GLUCOSE 112*  < > 198* 157* 144*  BUN 15  < > 15 13 25*  CREATININE 6.21*  < > 4.93* 3.56* 5.13*  CALCIUM 8.2*  < > 8.0* 8.1* 8.3*  PHOS 5.0*  --   --   --   --   < > = values in this interval not displayed. Liver Function Tests:  Recent Labs Lab 04/21/16 2157 04/23/16 1442 04/24/16 0104 04/25/16 0332  AST 60*  --  68* 55*  ALT 14  --  17 16  ALKPHOS 226*  --  253* 214*  BILITOT 1.5*  --  1.6* 0.8  PROT 7.5  --  6.1* 5.3*  ALBUMIN 2.3* 1.8* 1.8* 1.5*   No results for input(s): LIPASE, AMYLASE in the last 168 hours. CBC:  Recent Labs Lab 04/21/16 2157  04/24/16 0104 04/25/16 0332 04/25/16 2038 04/26/16 0531 04/27/16 0541  WBC 8.7  < > 7.6 11.0* 11.6* 14.1* 14.9*  NEUTROABS 3.6  --   --   --   --   --   --   HGB 13.8  < > 11.9*  10.3* 10.2* 9.6* 9.3*  HCT 41.4  < > 35.4* 30.2* 30.1* 28.2* 27.4*  MCV 88.7  < > 87.2 85.8 86.0 84.7 85.4  PLT 133*  < > 91* 93* 95* 89* 124*  < > = values in this interval not displayed. Blood Culture    Component Value Date/Time   SDES BLOOD RIGHT HAND 03/18/2016 1655   SPECREQUEST BOTTLES DRAWN AEROBIC ONLY 5CC 03/18/2016 1655   CULT NO GROWTH 5 DAYS 03/18/2016 1655   REPTSTATUS 03/23/2016 FINAL 03/18/2016 1655    Cardiac Enzymes: No results for input(s): CKTOTAL, CKMB, CKMBINDEX, TROPONINI in the last 168 hours. CBG:  Recent Labs Lab 04/26/16 1120 04/26/16 1740 04/26/16 2151 04/27/16 0601 04/27/16 0730  GLUCAP 101* 174* 172* 157* 148*   Iron Studies: No results for input(s): IRON, TIBC, TRANSFERRIN, FERRITIN in the last 72 hours. Lab Results  Component Value Date   INR 1.80 04/27/2016   INR 1.77 04/26/2016   INR 1.78 04/25/2016   Studies/Results: No results found. Medications: . heparin 1,300 Units/hr (04/27/16 0616)   . aspirin EC  81 mg Oral Daily  . calcitRIOL  0.5 mcg Oral Q M,W,F-HD  . calcium acetate  667 mg Oral TID WC  . [START ON 04/28/2016] darbepoetin (ARANESP) injection -  DIALYSIS  100 mcg Intravenous Q Mon-HD  . feeding supplement  1 Container Oral TID BM  . feeding supplement (PRO-STAT SUGAR FREE 64)  30 mL Oral BID  . gabapentin  100 mg Oral Q M,W,F-HD  . gabapentin  300 mg Oral QHS  . insulin aspart  0-5 Units Subcutaneous QHS  . insulin aspart  0-9 Units Subcutaneous TID WC  . levothyroxine  175 mcg Oral QAC breakfast  . metoprolol tartrate  25 mg Oral BID  . multivitamin  1 tablet Oral QHS  . pantoprazole  40 mg Oral Daily  . senna-docusate  2 tablet Oral QHS  . tacrolimus  2 mg Oral BID    I have seen and examined this patient and agree with plan and assessment in the above note with renal recommendations/intervention highlighted. She is feeling much better and in less pain today. [place] John A Jayanth Szczesniak,MD 04/27/2016 10:58 AM

## 2016-04-27 NOTE — Progress Notes (Addendum)
PROGRESS NOTE  [name]  WVP:[id number] DOB: [birth year] DOA: 04/21/2016 PCP: No primary care provider on file.  Outpatient Specialists: [facility], Dr. Scot Dock  Brief Narrative: [name] is a 61 y.o. female with a past medical history significant for ESRD on HD MWF, s/p Liver transplant for hep C followed at [facility], AS s/p AVR and CABG in 2015 on warfarin who presented with acute worsening of chronic foot pain due to PVD. Her vascular surgeon, Dr. Scot Dock, had planned BKA 3/20, though she became unable to control the pain, described as constant, excruciating and not relieved by oral narcotics at home. On presentation, she was afebrile, heart rate 93, respirations 21, BP 210/88, SpO2 96%. INR 3.0, WBC 8.7, hgb 13.8. The patient was admitted for pain control, coumadin held and started on heparin. A stress myoview was ordered for surgical risk stratification. Vascular surgery  consulted and plans on inpatient BKA once INR drops below 2.0. BKA performed 3/15    Assessment & Plan: Principal Problem:   Claudication of left lower extremity ([place]) Active Problems:   Hypothyroidism   Type 2 diabetes mellitus with chronic kidney disease ([place])   Anemia due to chronic renal failure    End stage renal disease on dialysis [facility])   CAD (coronary artery disease), native coronary artery   History of prosthetic aortic valve replacement   Chronic combined systolic and diastolic congestive heart failure (HCC)   COPD (chronic obstructive pulmonary disease) (HCC)   Chronic anticoagulation w/ coumadin, goal INR 2.5-3.0 w/ AVR   S/P liver transplant ([place])   Foot pain   Critical lower limb ischemia   Acute on chronic peripheral vascular disease with dry gangrene and intractable ischemic pain:  status post BKA    Continue prn Dilaudid , oxycodone for pain control  Zofran prn nausea  VVS performed BKA 3/15,   CV clearance per cardiology obtained prior to surgery-NM stress test  ,Myoview perf  was low risk with anterolateral scar and no ischemia. Cleared by cardiology  Mild oozing from left BKA but hemoglobin stable  ESRD on HD MWF: Sees Dr. Justin Mend as outpatient. - HD per nephrology, - Continue phoslo  Liver transplant: Followed at [facility] hepatology.  - Continue prograf.    transplant hepatologist on-call who reviewed the records, recommends continue tacrolimus at this dose.   Aortic stenosis s/p mechanical AVR: On warfarin.  Per Dr. Evette Georges note, will need bridging.   - needs  heparin gtt when INR less than 2.5,   since she has a [employer] mechanical valve, needs bridging once her Coumadin is held to reduce the likelihood of prosthetic valve thrombosis.  Status post vitamin K 2 mg IV on 3/14, INR subtherapeutic 1.78>1.8 Continue heparin drip until INR greater than 2.5   CAD s/p CABG: Status post Lexiscan Myoview.   - Myoview  was low risk with anterolateral scar and no ischemia. Deemed low risk to have her BKA  - Continue aspirin, metoprolol  Anemia of chronic disease Hemoglobin has drifted down from 12.2> 9.6>9.3 Continue to monitor, transfuse for hemoglobin less than 8.0   Hypertension:  - Treat pain - Continue metoprolol - HD per nephrology - Hydralazine prn ordered  Hypothyroidism:  - Continue levothyroxine    DVT prophylaxis: Heparin gtt Code Status: DNR Family Communication: None at bedside Disposition Plan:  [place] 3/15, pending establishment of therapeutic INR, possible CIR discharge on Monday   Consultants:   [facility], Dr. Scot Dock  Cardiology  Nephrology   Procedures:  Myoview 3/13    Antimicrobials:  None    Subjective: Somnolent but still complaining of pain  Objective: Vitals:   04/26/16 2027 04/27/16 0407 04/27/16 0600 04/27/16 0900  BP: (!) 108/57 (!) 150/63 132/64 (!) 123/47  Pulse: 76 76 87 79  Resp: 15 16  18   Temp: 98.7 F (37.1 C) 98.9 F (37.2 C) 98.8 F (37.1 C) 99.6 F (37.6 C)  TempSrc:   Oral Oral  SpO2: 95%  100% 93% 95%  Weight: 55 kg (121 lb 4.1 oz)     Height:        Intake/Output Summary (Last 24 hours) at 04/27/16 0916 Last data filed at 04/27/16 0902  Gross per 24 hour  Intake              340 ml  Output                0 ml  Net              340 ml   Filed Weights   04/25/16 1049 04/25/16 2136 04/26/16 2027  Weight: 54.3 kg (119 lb 11.4 oz) 54.4 kg (119 lb 14.9 oz) 55 kg (121 lb 4.1 oz)   Examination: General exam: 61 y.o. female in evident pain Respiratory system: Non-labored breathing ambient air. Clear to auscultation bilaterally.  Cardiovascular system: Regular rate and rhythm. No murmur, rub, or gallop. No JVD, and no pedal edema. 2+ radial pulses. Absent DP pulses. Left thigh graft +thrill.  Gastrointestinal system: Abdomen soft, non-tender, non-distended, with normoactive bowel sounds. No organomegaly or masses felt. Central nervous system: Alert and oriented. No focal neurological deficits. Extremities:  s/p: left BKA Psychiatry: Judgement and insight appear normal. Mood & affect appropriate.   Data Reviewed: I have personally reviewed following labs and imaging studies  CBC:  Recent Labs Lab 04/21/16 2157  04/24/16 0104 04/25/16 0332 04/25/16 2038 04/26/16 0531 04/27/16 0541  WBC 8.7  < > 7.6 11.0* 11.6* 14.1* 14.9*  NEUTROABS 3.6  --   --   --   --   --   --   HGB 13.8  < > 11.9* 10.3* 10.2* 9.6* 9.3*  HCT 41.4  < > 35.4* 30.2* 30.1* 28.2* 27.4*  MCV 88.7  < > 87.2 85.8 86.0 84.7 85.4  PLT 133*  < > 91* 93* 95* 89* 124*  < > = values in this interval not displayed. Basic Metabolic Panel:  Recent Labs Lab 04/23/16 1442 04/23/16 1930 04/24/16 0104 04/25/16 0332 04/26/16 0531 04/27/16 0541  NA 138  --  133* 134* 131* 132*  K 3.1*  --  3.6 4.2 3.6 3.8  CL 97*  --  99* 99* 97* 97*  CO2 31  --  25 26 26 24   GLUCOSE 112*  --  95 198* 157* 144*  BUN 15  --  6 15 13  25*  CREATININE 6.21*  --  3.42* 4.93* 3.56* 5.13*  CALCIUM 8.2*  --  8.1* 8.0* 8.1*  8.3*  MG  --  1.9  --   --   --   --   PHOS 5.0*  --   --   --   --   --    GFR: Estimated Creatinine Clearance: 10 mL/min (A) (by C-G formula based on SCr of 5.13 mg/dL (H)). Liver Function Tests:  Recent Labs Lab 04/21/16 2157 04/23/16 1442 04/24/16 0104 04/25/16 0332  AST 60*  --  68* 55*  ALT 14  --  17  16  ALKPHOS 226*  --  253* 214*  BILITOT 1.5*  --  1.6* 0.8  PROT 7.5  --  6.1* 5.3*  ALBUMIN 2.3* 1.8* 1.8* 1.5*   No results for input(s): LIPASE, AMYLASE in the last 168 hours. No results for input(s): AMMONIA in the last 168 hours. Coagulation Profile:  Recent Labs Lab 04/23/16 0640 04/24/16 0104 04/25/16 0332 04/26/16 0531 04/27/16 0541  INR 3.20 1.67 1.78 1.77 1.80   Cardiac Enzymes: No results for input(s): CKTOTAL, CKMB, CKMBINDEX, TROPONINI in the last 168 hours. BNP (last 3 results) No results for input(s): PROBNP in the last 8760 hours. HbA1C: No results for input(s): HGBA1C in the last 72 hours. CBG:  Recent Labs Lab 04/26/16 1120 04/26/16 1740 04/26/16 2151 04/27/16 0601 04/27/16 0730  GLUCAP 101* 174* 172* 157* 148*   Lipid Profile: No results for input(s): CHOL, HDL, LDLCALC, TRIG, CHOLHDL, LDLDIRECT in the last 72 hours. Thyroid Function Tests: No results for input(s): TSH, T4TOTAL, FREET4, T3FREE, THYROIDAB in the last 72 hours. Anemia Panel: No results for input(s): VITAMINB12, FOLATE, FERRITIN, TIBC, IRON, RETICCTPCT in the last 72 hours. Urine analysis:    Component Value Date/Time   COLORURINE YELLOW 11/02/2012 0428   APPEARANCEUR CLOUDY (A) 11/02/2012 0428   LABSPEC 1.011 11/02/2012 0428   PHURINE 8.0 11/02/2012 0428   GLUCOSEU 250 (A) 11/02/2012 0428   HGBUR LARGE (A) 11/02/2012 0428   BILIRUBINUR NEGATIVE 11/02/2012 0428   KETONESUR NEGATIVE 11/02/2012 0428   PROTEINUR >300 (A) 11/02/2012 0428   UROBILINOGEN 0.2 11/02/2012 0428   NITRITE NEGATIVE 11/02/2012 0428   LEUKOCYTESUR NEGATIVE 11/02/2012 0428   Recent Results  (from the past 240 hour(s))  Surgical pcr screen     Status: None   Collection Time: 04/23/16 10:40 PM  Result Value Ref Range Status   MRSA, PCR NEGATIVE NEGATIVE Final   Staphylococcus aureus NEGATIVE NEGATIVE Final    Comment:        The Xpert SA Assay (FDA approved for NASAL specimens in patients over 16 years of age), is one component of a comprehensive surveillance program.  Test performance has been validated by [facility] for patients greater than or equal to 64 year old. It is not intended to diagnose infection nor to guide or monitor treatment.       Radiology Studies: No results found.  Scheduled Meds: . aspirin EC  81 mg Oral Daily  . calcitRIOL  0.5 mcg Oral Q M,W,F-HD  . calcium acetate  667 mg Oral TID WC  . [START ON 04/28/2016] darbepoetin (ARANESP) injection - DIALYSIS  100 mcg Intravenous Q Mon-HD  . feeding supplement  1 Container Oral TID BM  . feeding supplement (PRO-STAT SUGAR FREE 64)  30 mL Oral BID  . gabapentin  100 mg Oral Q M,W,F-HD  . gabapentin  300 mg Oral QHS  . insulin aspart  0-5 Units Subcutaneous QHS  . insulin aspart  0-9 Units Subcutaneous TID WC  . levothyroxine  175 mcg Oral QAC breakfast  . metoprolol tartrate  25 mg Oral BID  . multivitamin  1 tablet Oral QHS  . pantoprazole  40 mg Oral Daily  . senna-docusate  2 tablet Oral QHS  . tacrolimus  2 mg Oral BID   Continuous Infusions: . heparin 1,300 Units/hr (04/27/16 0616)     LOS: 6 days   Time spent: 25 minutes.  Reyne Dumas, MD [facility] Pager [phone]  If 7PM-7AM, please contact night-coverage [email or web address] Password [facility] 04/27/2016, 9:16 AM

## 2016-04-27 NOTE — Progress Notes (Signed)
[place] for Heparin  Indication: AVR  Allergies  Allergen Reactions  . Acetaminophen Other (See Comments)    Liver transplant recipient   . Morphine Itching and Other (See Comments)    hallucinate  . Codeine Itching  . Mirtazapine Other (See Comments)    hallucination   Patient Measurements: Height: 5' 4.5" (163.8 cm) (from 04/17/16) Weight: 121 lb 4.1 oz (55 kg) IBW/kg (Calculated) : 55.85  Vital Signs: Temp: 98.7 F (37.1 C) (03/17 2027) Temp Source: Oral (03/17 1724) BP: 108/57 (03/17 2027) Pulse Rate: 76 (03/17 2027)  Labs:  Recent Labs  04/24/16 0104  04/24/16 0925 04/25/16 0332  04/25/16 2038 04/26/16 0531 04/26/16 1551 04/27/16 0004  HGB 11.9*  --   --  10.3*  --  10.2* 9.6*  --   --   HCT 35.4*  --   --  30.2*  --  30.1* 28.2*  --   --   PLT 91*  --   --  93*  --  95* 89*  --   --   APTT  --   --  >200*  --   --   --   --   --   --   LABPROT 19.9*  --   --  20.9*  --   --  20.8*  --   --   INR 1.67  --   --  1.78  --   --  1.77  --   --   HEPARINUNFRC  --   < > 0.68 0.32  < > 0.49 0.25* 0.41 0.28*  CREATININE 3.42*  --   --  4.93*  --   --  3.56*  --   --   < > = values in this interval not displayed.  Estimated Creatinine Clearance: 14.4 mL/min (A) (by C-G formula based on SCr of 3.56 mg/dL (H)).   Assessment: 61 y.o. female with h/o mechanical AVR s/p BKA , Coumadin on hold, for heparin  Goal of Therapy:  Heparin level 0.3-0.7 units/ml Monitor platelets by anticoagulation protocol: Yes   Plan:  Increase Heparin  1300 units/hr  [name], [name] 04/27/2016,12:48 AM

## 2016-04-28 ENCOUNTER — Telehealth: Payer: Self-pay | Admitting: Vascular Surgery

## 2016-04-28 LAB — BASIC METABOLIC PANEL
ANION GAP: 12 (ref 5–15)
BUN: 38 mg/dL — ABNORMAL HIGH (ref 6–20)
CHLORIDE: 96 mmol/L — AB (ref 101–111)
CO2: 24 mmol/L (ref 22–32)
Calcium: 8 mg/dL — ABNORMAL LOW (ref 8.9–10.3)
Creatinine, Ser: 6.6 mg/dL — ABNORMAL HIGH (ref 0.44–1.00)
GFR calc non Af Amer: 6 mL/min — ABNORMAL LOW (ref >60)
GFR, EST AFRICAN AMERICAN: 7 mL/min — AB (ref >60)
Glucose, Bld: 159 mg/dL — ABNORMAL HIGH (ref 65–99)
POTASSIUM: 3.9 mmol/L (ref 3.5–5.1)
Sodium: 132 mmol/L — ABNORMAL LOW (ref 135–145)

## 2016-04-28 LAB — CBC
HEMATOCRIT: 25.7 % — AB (ref 36.0–46.0)
Hemoglobin: 8.7 g/dL — ABNORMAL LOW (ref 12.0–15.0)
MCH: 28.5 pg (ref 26.0–34.0)
MCHC: 33.9 g/dL (ref 30.0–36.0)
MCV: 84.3 fL (ref 78.0–100.0)
Platelets: 142 10*3/uL — ABNORMAL LOW (ref 150–400)
RBC: 3.05 MIL/uL — AB (ref 3.87–5.11)
RDW: 16.7 % — ABNORMAL HIGH (ref 11.5–15.5)
WBC: 15.4 10*3/uL — AB (ref 4.0–10.5)

## 2016-04-28 LAB — GLUCOSE, CAPILLARY
Glucose-Capillary: 112 mg/dL — ABNORMAL HIGH (ref 65–99)
Glucose-Capillary: 115 mg/dL — ABNORMAL HIGH (ref 65–99)
Glucose-Capillary: 114 mg/dL — ABNORMAL HIGH (ref 65–99)

## 2016-04-28 LAB — HEPARIN LEVEL (UNFRACTIONATED): Heparin Unfractionated: 0.47 IU/mL (ref 0.30–0.70)

## 2016-04-28 LAB — PROTIME-INR
INR: 1.72
Prothrombin Time: 20.3 seconds — ABNORMAL HIGH (ref 11.4–15.2)

## 2016-04-28 MED ORDER — SODIUM CHLORIDE 0.9 % IV BOLUS (SEPSIS)
500.0000 mL | Freq: Once | INTRAVENOUS | Status: AC
  Administered 2016-04-28: 500 mL via INTRAVENOUS

## 2016-04-28 MED ORDER — DARBEPOETIN ALFA 100 MCG/0.5ML IJ SOSY
PREFILLED_SYRINGE | INTRAMUSCULAR | Status: AC
  Administered 2016-04-28: 100 ug via INTRAVENOUS
  Filled 2016-04-28: qty 0.5

## 2016-04-28 MED ORDER — HEPARIN SODIUM (PORCINE) 1000 UNIT/ML DIALYSIS: 20.0000 [IU]/kg | INTRAMUSCULAR | Status: DC | PRN

## 2016-04-28 MED ORDER — LIDOCAINE HCL (PF) 1 % IJ SOLN: 5.0000 mL | INTRAMUSCULAR | Status: DC | PRN

## 2016-04-28 MED ORDER — MAGNESIUM OXIDE 400 (241.3 MG) MG PO TABS
400.0000 mg | ORAL_TABLET | Freq: Once | ORAL | Status: AC
Start: 2016-04-28 — End: 2016-04-28
  Administered 2016-04-28: 400 mg via ORAL
  Filled 2016-04-28: qty 1

## 2016-04-28 MED ORDER — ALTEPLASE 2 MG IJ SOLR: 2.0000 mg | Freq: Once | INTRAMUSCULAR | Status: DC | PRN

## 2016-04-28 MED ORDER — LIDOCAINE-PRILOCAINE 2.5-2.5 % EX CREA: 1.0000 "application " | TOPICAL_CREAM | CUTANEOUS | Status: DC | PRN

## 2016-04-28 MED ORDER — HEPARIN SODIUM (PORCINE) 1000 UNIT/ML DIALYSIS: 1000.0000 [IU] | INTRAMUSCULAR | Status: DC | PRN

## 2016-04-28 MED ORDER — KETOROLAC TROMETHAMINE 15 MG/ML IJ SOLN
15.0000 mg | Freq: Once | INTRAMUSCULAR | Status: AC
  Administered 2016-04-28: 15 mg via INTRAVENOUS
  Filled 2016-04-28: qty 1

## 2016-04-28 MED ORDER — CALCITRIOL 0.5 MCG PO CAPS
ORAL_CAPSULE | ORAL | Status: AC
  Administered 2016-04-28: 0.5 ug via ORAL
  Filled 2016-04-28: qty 1

## 2016-04-28 MED ORDER — HYDROMORPHONE HCL 1 MG/ML IJ SOLN
INTRAMUSCULAR | Status: AC
  Filled 2016-04-28: qty 2

## 2016-04-28 MED ORDER — SODIUM CHLORIDE 0.9 % IV SOLN
100.0000 mL | INTRAVENOUS | Status: DC | PRN
Start: 2016-04-28 — End: 2016-04-28

## 2016-04-28 MED ORDER — HYDROMORPHONE HCL 1 MG/ML IJ SOLN
1.0000 mg | Freq: Once | INTRAMUSCULAR | Status: AC
  Administered 2016-04-28: 2 mg via INTRAVENOUS

## 2016-04-28 MED ORDER — WARFARIN SODIUM 4 MG PO TABS
4.0000 mg | ORAL_TABLET | Freq: Once | ORAL | Status: AC
  Administered 2016-04-28: 4 mg via ORAL
  Filled 2016-04-28: qty 1

## 2016-04-28 MED ORDER — TRAMADOL HCL 50 MG PO TABS
50.0000 mg | ORAL_TABLET | Freq: Four times a day (QID) | ORAL | Status: DC | PRN
  Administered 2016-04-28: 50 mg via ORAL
  Filled 2016-04-28: qty 1

## 2016-04-28 MED ORDER — PENTAFLUOROPROP-TETRAFLUOROETH EX AERO: 1.0000 "application " | INHALATION_SPRAY | CUTANEOUS | Status: DC | PRN

## 2016-04-28 MED ORDER — SODIUM CHLORIDE 0.9 % IV SOLN: 100.0000 mL | INTRAVENOUS | Status: DC | PRN

## 2016-04-28 MED ORDER — POTASSIUM CHLORIDE CRYS ER 20 MEQ PO TBCR
20.0000 meq | EXTENDED_RELEASE_TABLET | Freq: Once | ORAL | Status: AC
  Administered 2016-04-28: 20 meq via ORAL
  Filled 2016-04-28: qty 1

## 2016-04-28 NOTE — Progress Notes (Signed)
Patient had a 3 beat run of [facility] and then a 6 beat run of [place]. Patient asymptomatic. MD notified. Will continue to monitor.

## 2016-04-28 NOTE — Telephone Encounter (Signed)
Sched appt 05/28/16 at 11:45. Lm on hm# to inform pt.

## 2016-04-28 NOTE — Progress Notes (Signed)
Occupational Therapy Treatment Patient Details Name: [name] MRN: [id number] DOB: Nov 29, 1955 Today's Date: 04/28/2016    History of present illness  [name] is a 61 y.o. 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   OT comments  Pt teary and upset upon OT entering room but verbalized willingness to participaten in OT. Pt demonstrates decreased activity tolerance with need for Mod-Max A for bed mobility and Max +2 for sit>stand. Pt showed to be disoriented to place, time, and situation. Pt lethargic and confused; RN confirmed that pt has been so since coming back from HD.  Will continue to follow acutely to facilitate progress towards goals and dc. Continue to recommend dc to CIR.    Follow Up Recommendations  CIR    Equipment Recommendations  Other (comment) (Defer to next venue)    Recommendations for Other Services      Precautions / Restrictions Precautions Precautions: Fall Restrictions Weight Bearing Restrictions: Yes LLE Weight Bearing: Non weight bearing       Mobility Bed Mobility Overal bed mobility: Needs Assistance Bed Mobility: Supine to Sit     Supine to sit: Mod assist     General bed mobility comments: Pt required A for trunk and scooting towards EOB. Pt demonstrates lean to left  Transfers Overall transfer level: Needs assistance Equipment used: Rolling walker (2 wheeled) Transfers: Sit to/from Stand Sit to Stand: Max assist;+2 physical assistance              Balance Overall balance assessment: Needs assistance Sitting-balance support: Bilateral upper extremity supported;Feet supported Sitting balance-Leahy Scale: Poor     Standing balance support: Bilateral upper extremity supported;During functional activity Standing balance-Leahy  Scale: Zero Standing balance comment: Pt unable to maintain balance and needed to sit down                   ADL Overall ADL's : Needs assistance/impaired                                       General ADL Comments: Pt unable to partciaption in grooming task due to lethargic behavior and decreased orientation      Vision                     Perception     Praxis      Cognition   Behavior During Therapy: Flat affect;Anxious Overall Cognitive Status: Impaired/Different from baseline Area of Impairment: Orientation;Following commands;Awareness Orientation Level: Disoriented to;Place;Time;Situation      Following Commands: Follows one step commands inconsistently;Follows one step commands with increased time   Awareness: Intellectual          Exercises     Shoulder Instructions       General Comments      Pertinent Vitals/ Pain       Pain Assessment: Faces Faces Pain Scale: Hurts worst Pain Location: L LE Pain Descriptors / Indicators: Burning;Crying;Grimacing;Guarding Pain Intervention(s): Limited activity within patient's tolerance  Home Living  Prior Functioning/Environment              Frequency  Min 2X/week        Progress Toward Goals  OT Goals(current goals can now be found in the care plan section)  Progress towards OT goals: Not progressing toward goals - comment (Pt demonstrated decreased orientation and particiaption poss)  Acute Rehab OT Goals Patient Stated Goal: Get stronger and go home OT Goal Formulation: With patient Time For Goal Achievement: 05/09/16 Potential to Achieve Goals: Good ADL Goals Pt Will Perform Grooming: with min guard assist;standing Pt Will Perform Lower Body Dressing: with min guard assist;sit to/from stand Pt Will Transfer to Toilet: with min guard assist;stand pivot transfer;bedside commode  Plan Discharge plan remains  appropriate    Co-evaluation                 End of Session Equipment Utilized During Treatment: Gait belt;Rolling walker  OT Visit Diagnosis: Unsteadiness on feet (R26.81);Pain Pain - Right/Left: Left Pain - part of body: Leg   Activity Tolerance Patient limited by fatigue;Patient limited by pain;Patient limited by lethargy   Patient Left in bed;with call bell/phone within reach;with bed alarm set;with nursing/sitter in room   Nurse Communication Mobility status        Time: 1287-8676 OT Time Calculation (min): 31 min  Charges: OT General Charges $OT Visit: 1 Procedure OT Treatments $Therapeutic Activity: 23-37 mins  [place], OTR/L [phone]   [place] 04/28/2016, 2:37 PM

## 2016-04-28 NOTE — Progress Notes (Signed)
ANTICOAGULATION CONSULT NOTE - Follow Up Consult  Pharmacy Consult for Heparin bridge to Warfarin Indication: AVR  Allergies  Allergen Reactions  . Acetaminophen Other (See Comments)    Liver transplant recipient   . Morphine Itching and Other (See Comments)    hallucinate  . Codeine Itching  . Mirtazapine Other (See Comments)    hallucination   Patient Measurements: Height: 5' 4.5" (163.8 cm) (from 04/17/16) Weight: 121 lb 4.1 oz (55 kg) IBW/kg (Calculated) : 55.85  Vital Signs: Temp: 100.1 F (37.8 C) (03/19 0542) BP: 146/65 (03/19 0542) Pulse Rate: 80 (03/19 0542)  Assessment: 61 yo F on Coumadin 2mg  daily PTA for h/o mechanical aortic valve. Goal INR 2.5-3.5. Patient came to ED 3/12 with severe L foot pain. 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 Patient was changed to heparin on admission, now restarting warfarin with heparin bridge.   Noted bleeding from L BKA overnight- no changes to medications made, dressing changed. No further bleeding noted.  INR 1.72 (below goal), heparin level in range at 0.47 units/mL.  PTA warfarin dose is 2mg  daily.  Goal of Therapy:  Heparin level 0.3-0.7 units/ml  INR 2.5-3.5 Monitor platelets by anticoagulation protocol: Yes   Plan:  Heparin at 1300 units/hr IV  Warfarin 4mg  PO x1 tonight Monitor daily heparin level, INR, CBC, s/s of bleed  Adanna Zuckerman D. Sarajean Dessert, PharmD, BCPS Clinical Pharmacist Pager: [phone] 04/28/2016 7:37 AM

## 2016-04-28 NOTE — Progress Notes (Signed)
Patient returned from HD disoriented and asking where her daughter was. Per HD nurse, patient was given dilaudid. Patient has scheduled OxyContin 10 mg. MD notified. Allyson Sabal, MD stated to hold OxyContin. Patient is in pain. MD stated that she would place orders. Will continue to monitor.

## 2016-04-28 NOTE — Telephone Encounter (Signed)
-----   Message from Denman George, RN sent at 04/28/2016  9:15 AM EDT ----- Regarding: needs 4 week f/u with Dr. Scot Dock    ----- Message ----- From: Alvia Grove, PA-C Sent: 04/26/2016  10:16 AM To: [facility] Charge Pool  s/p left BKA 04/24/16  f/u in 4 weeks with [facility]  Thanks Maudie Mercury

## 2016-04-28 NOTE — Progress Notes (Signed)
Rehab admissions - I met with patient in HD today.  I then called her daughter.  Dtr would like inpatient rehab admission prior to home with daughter.  Per Dr. Allyson Sabal, patient not yet ready for inpatient rehab.  I will await medical readiness.  Call me for questions.  #[phone]

## 2016-04-28 NOTE — Progress Notes (Signed)
PROGRESS NOTE  [name]  GUY:[id number] DOB: 1955/03/17 DOA: 04/21/2016 PCP: No primary care provider on file.  Outpatient Specialists: [facility], Dr. Scot Dock  Brief Narrative: [name] is a 61 y.o. female with a past medical history significant for ESRD on HD MWF, s/p Liver transplant for hep C followed at [facility], AS s/p AVR and CABG in 2015 on warfarin who presented with acute worsening of chronic foot pain due to PVD. Her vascular surgeon, Dr. Scot Dock, had planned BKA 3/20, though she became unable to control the pain, described as constant, excruciating and not relieved by oral narcotics at home. On presentation, she was afebrile, heart rate 93, respirations 21, BP 210/88, SpO2 96%. INR 3.0, WBC 8.7, hgb 13.8. The patient was admitted for pain control, coumadin held and started on heparin. A stress myoview was ordered for surgical risk stratification. Vascular surgery  consulted and plans on inpatient BKA once INR drops below 2.0. BKA performed 3/15    Assessment & Plan: Principal Problem:   Claudication of left lower extremity ([place]) Active Problems:   Hypothyroidism   Type 2 diabetes mellitus with chronic kidney disease ([place])   Anemia due to chronic renal failure    End stage renal disease on dialysis [facility])   CAD (coronary artery disease), native coronary artery   History of prosthetic aortic valve replacement   Chronic combined systolic and diastolic congestive heart failure (HCC)   COPD (chronic obstructive pulmonary disease) (HCC)   Chronic anticoagulation w/ coumadin, goal INR 2.5-3.0 w/ AVR   S/P liver transplant ([place])   Foot pain   Critical lower limb ischemia   Acute on chronic peripheral vascular disease with dry gangrene and intractable ischemic pain:  status post BKA    Continue prn Dilaudid , oxycodone ,OxyContin for pain control, with close monitoring for oversedation  Zofran prn nausea [facility] performed BKA 3/15,   preop clearance per cardiology  obtained prior to surgery-NM stress test  ,Myoview perf was low risk with anterolateral scar and no ischemia. Cleared by cardiology  hg drifting down   ESRD on HD MWF: Sees Dr. Justin Mend as outpatient. - HD per nephrology, - Continue phoslo  Liver transplant: Followed at [facility] hepatology.  - Continue prograf.    transplant hepatologist on-call who reviewed the records, recommends continue tacrolimus at this dose.   Aortic stenosis s/p mechanical AVR: On warfarin.  Per Dr. Evette Georges note, will need bridging.   - needs  heparin gtt when INR less than 2.5,   since she has a [employer]. [employer]'s mechanical valve, needs bridging once her Coumadin is held to reduce the likelihood of prosthetic valve thrombosis.  Status post vitamin K 2 mg IV on 3/14, INR subtherapeutic 1.78>1.8>1.72 Continue heparin drip until INR greater than 2.5   CAD s/p CABG: Status post Lexiscan Myoview.   - Myoview  was low risk with anterolateral scar and no ischemia. Deemed low risk to have her BKA  - Continue aspirin, metoprolol  Anemia of chronic disease Hemoglobin has drifted down from 12.2> 9.6>9.3>8.7 Continue to monitor, transfuse for hemoglobin less than 8.0   Hypertension:  - Treat pain - Continue metoprolol - HD per nephrology - Hydralazine prn ordered  Hypothyroidism:  - Continue levothyroxine Last TSH 4.8, 03/06/16   NSVT  EF 40-45%, will continue coreg  replete K   DVT prophylaxis: Heparin gtt Code Status: DNR Family Communication: None at bedside Disposition Plan:  [place] 3/15, pending establishment of therapeutic INR,  CIR discharge  when INR >2.5   Consultants:   [facility], Dr. Scot Dock  Cardiology  Nephrology   Procedures:   Myoview 3/13    Antimicrobials:  None    Subjective: Somnolent but still complaining of pain  Objective: Vitals:   04/28/16 0542 04/28/16 0730 04/28/16 0750 04/28/16 0820  BP: (!) 146/65 (!) 157/75 (!) 148/68 128/62  Pulse: 80 81 78 84  Resp: 17 18 17 17   Temp:  100.1 F (37.8 C) 98.9 F (37.2 C) 98.9 F (37.2 C)   TempSrc:  Oral    SpO2: 95% 96%    Weight:  57.8 kg (127 lb 6.8 oz)    Height:        Intake/Output Summary (Last 24 hours) at 04/28/16 0957 Last data filed at 04/28/16 7619  Gross per 24 hour  Intake              360 ml  Output                0 ml  Net              360 ml   Filed Weights   04/25/16 2136 04/26/16 2027 04/28/16 0730  Weight: 54.4 kg (119 lb 14.9 oz) 55 kg (121 lb 4.1 oz) 57.8 kg (127 lb 6.8 oz)   Examination: General exam: 61 y.o. female in evident pain Respiratory system: Non-labored breathing ambient air. Clear to auscultation bilaterally.  Cardiovascular system: Regular rate and rhythm. No murmur, rub, or gallop. No JVD, and no pedal edema. 2+ radial pulses. Absent DP pulses. Left thigh graft +thrill.  Gastrointestinal system: Abdomen soft, non-tender, non-distended, with normoactive bowel sounds. No organomegaly or masses felt. Central nervous system: Alert and oriented. No focal neurological deficits. Extremities:  s/p: left BKA Psychiatry: Judgement and insight appear normal. Mood & affect appropriate.   Data Reviewed: I have personally reviewed following labs and imaging studies  CBC:  Recent Labs Lab 04/21/16 2157  04/25/16 0332 04/25/16 2038 04/26/16 0531 04/27/16 0541 04/28/16 0532  WBC 8.7  < > 11.0* 11.6* 14.1* 14.9* 15.4*  NEUTROABS 3.6  --   --   --   --   --   --   HGB 13.8  < > 10.3* 10.2* 9.6* 9.3* 8.7*  HCT 41.4  < > 30.2* 30.1* 28.2* 27.4* 25.7*  MCV 88.7  < > 85.8 86.0 84.7 85.4 84.3  PLT 133*  < > 93* 95* 89* 124* 142*  < > = values in this interval not displayed. Basic Metabolic Panel:  Recent Labs Lab 04/23/16 1442 04/23/16 1930 04/24/16 0104 04/25/16 0332 04/26/16 0531 04/27/16 0541 04/28/16 0532  NA 138  --  133* 134* 131* 132* 132*  K 3.1*  --  3.6 4.2 3.6 3.8 3.9  CL 97*  --  99* 99* 97* 97* 96*  CO2 31  --  25 26 26 24 24   GLUCOSE 112*  --  95 198* 157*  144* 159*  BUN 15  --  6 15 13  25* 38*  CREATININE 6.21*  --  3.42* 4.93* 3.56* 5.13* 6.60*  CALCIUM 8.2*  --  8.1* 8.0* 8.1* 8.3* 8.0*  MG  --  1.9  --   --   --   --   --   PHOS 5.0*  --   --   --   --   --   --    GFR: Estimated Creatinine Clearance: 7.9 mL/min (A) (by C-G formula based on SCr of  6.6 mg/dL (H)). Liver Function Tests:  Recent Labs Lab 04/21/16 2157 04/23/16 1442 04/24/16 0104 04/25/16 0332  AST 60*  --  68* 55*  ALT 14  --  17 16  ALKPHOS 226*  --  253* 214*  BILITOT 1.5*  --  1.6* 0.8  PROT 7.5  --  6.1* 5.3*  ALBUMIN 2.3* 1.8* 1.8* 1.5*   No results for input(s): LIPASE, AMYLASE in the last 168 hours. No results for input(s): AMMONIA in the last 168 hours. Coagulation Profile:  Recent Labs Lab 04/24/16 0104 04/25/16 0332 04/26/16 0531 04/27/16 0541 04/28/16 0532  INR 1.67 1.78 1.77 1.80 1.72   Cardiac Enzymes: No results for input(s): CKTOTAL, CKMB, CKMBINDEX, TROPONINI in the last 168 hours. BNP (last 3 results) No results for input(s): PROBNP in the last 8760 hours. HbA1C: No results for input(s): HGBA1C in the last 72 hours. CBG:  Recent Labs Lab 04/27/16 0601 04/27/16 0730 04/27/16 1136 04/27/16 1709 04/27/16 2013  GLUCAP 157* 148* 193* 139* 154*   Lipid Profile: No results for input(s): CHOL, HDL, LDLCALC, TRIG, CHOLHDL, LDLDIRECT in the last 72 hours. Thyroid Function Tests: No results for input(s): TSH, T4TOTAL, FREET4, T3FREE, THYROIDAB in the last 72 hours. Anemia Panel: No results for input(s): VITAMINB12, FOLATE, FERRITIN, TIBC, IRON, RETICCTPCT in the last 72 hours. Urine analysis:    Component Value Date/Time   COLORURINE YELLOW 11/02/2012 0428   APPEARANCEUR CLOUDY (A) 11/02/2012 0428   LABSPEC 1.011 11/02/2012 0428   PHURINE 8.0 11/02/2012 0428   GLUCOSEU 250 (A) 11/02/2012 0428   HGBUR LARGE (A) 11/02/2012 0428   BILIRUBINUR NEGATIVE 11/02/2012 0428   KETONESUR NEGATIVE 11/02/2012 0428   PROTEINUR >300 (A)  11/02/2012 0428   UROBILINOGEN 0.2 11/02/2012 0428   NITRITE NEGATIVE 11/02/2012 0428   LEUKOCYTESUR NEGATIVE 11/02/2012 0428   Recent Results (from the past 240 hour(s))  Surgical pcr screen     Status: None   Collection Time: 04/23/16 10:40 PM  Result Value Ref Range Status   MRSA, PCR NEGATIVE NEGATIVE Final   Staphylococcus aureus NEGATIVE NEGATIVE Final    Comment:        The Xpert SA Assay (FDA approved for NASAL specimens in patients over 53 years of age), is one component of a comprehensive surveillance program.  Test performance has been validated by [facility] for patients greater than or equal to 39 year old. It is not intended to diagnose infection nor to guide or monitor treatment.       Radiology Studies: No results found.  Scheduled Meds: . HYDROmorphone      . aspirin EC  81 mg Oral Daily  . calcitRIOL  0.5 mcg Oral Q M,W,F-HD  . calcium acetate  667 mg Oral TID WC  . darbepoetin (ARANESP) injection - DIALYSIS  100 mcg Intravenous Q Mon-HD  . feeding supplement  1 Container Oral TID BM  . feeding supplement (PRO-STAT SUGAR FREE 64)  30 mL Oral BID  . gabapentin  100 mg Oral Q M,W,F-HD  . gabapentin  300 mg Oral QHS  . insulin aspart  0-5 Units Subcutaneous QHS  . insulin aspart  0-9 Units Subcutaneous TID WC  . levothyroxine  175 mcg Oral QAC breakfast  . metoprolol tartrate  25 mg Oral BID  . multivitamin  1 tablet Oral QHS  . oxyCODONE  10 mg Oral Q24H  . pantoprazole  40 mg Oral Daily  . senna-docusate  2 tablet Oral QHS  . tacrolimus  2  mg Oral BID  . warfarin  4 mg Oral ONCE-1800  . Warfarin - Pharmacist Dosing Inpatient   Does not apply q1800   Continuous Infusions: . heparin 1,300 Units/hr (04/28/16 0240)     LOS: 6 days   Time spent: 25 minutes.  Reyne Dumas, MD [facility] Pager [phone]  If 7PM-7AM, please contact night-coverage [email or web address] Password [facility] 04/28/2016, 9:57 AM

## 2016-04-28 NOTE — Progress Notes (Signed)
PT Cancellation Note  Patient Details Name: [name] MRN: [id number] DOB: Mar 13, 1955   Cancelled Treatment:    Reason Eval/Treat Not Completed: Patient at procedure or test/unavailable;Other (comment);Pain limiting ability to participate (returned from HD in tears, nsg asked to hold).  Try later as time and pt allow.   Ramond Dial 04/28/2016, 1:24 PM   Mee Hives, PT MS Acute Rehab Dept. Number: [place]

## 2016-04-28 NOTE — Progress Notes (Signed)
Called and spoke with Dr. Trula Slade with [facility] regarding pts left BKA bleeding through gauze and ace wrap. MD stated to continue the Heparin gtt and change the dressing. Will continue to monitor.   Eleanora Neighbor, RN

## 2016-04-28 NOTE — Procedures (Signed)
  I was present at this dialysis session, have reviewed the session itself and made  appropriate changes [name] MD [place] pager [phone]   04/28/2016, 12:25 PM

## 2016-04-29 LAB — CBC
HEMATOCRIT: 24.8 % — AB (ref 36.0–46.0)
HEMOGLOBIN: 8.3 g/dL — AB (ref 12.0–15.0)
MCH: 28.2 pg (ref 26.0–34.0)
MCHC: 33.5 g/dL (ref 30.0–36.0)
MCV: 84.4 fL (ref 78.0–100.0)
Platelets: 193 10*3/uL (ref 150–400)
RBC: 2.94 MIL/uL — AB (ref 3.87–5.11)
RDW: 17 % — ABNORMAL HIGH (ref 11.5–15.5)
WBC: 15.3 10*3/uL — ABNORMAL HIGH (ref 4.0–10.5)

## 2016-04-29 LAB — PROTIME-INR
INR: 1.9
Prothrombin Time: 22.1 seconds — ABNORMAL HIGH (ref 11.4–15.2)

## 2016-04-29 LAB — GLUCOSE, CAPILLARY
GLUCOSE-CAPILLARY: 106 mg/dL — AB (ref 65–99)
Glucose-Capillary: 169 mg/dL — ABNORMAL HIGH (ref 65–99)
Glucose-Capillary: 146 mg/dL — ABNORMAL HIGH (ref 65–99)
Glucose-Capillary: 124 mg/dL — ABNORMAL HIGH (ref 65–99)

## 2016-04-29 LAB — PREPARE RBC (CROSSMATCH)

## 2016-04-29 LAB — HEPARIN LEVEL (UNFRACTIONATED): Heparin Unfractionated: 0.34 IU/mL (ref 0.30–0.70)

## 2016-04-29 MED ORDER — WARFARIN SODIUM 4 MG PO TABS
4.0000 mg | ORAL_TABLET | Freq: Once | ORAL | Status: AC
  Administered 2016-04-29: 4 mg via ORAL
  Filled 2016-04-29: qty 1

## 2016-04-29 MED ORDER — SODIUM CHLORIDE 0.9 % IV SOLN: Freq: Once | INTRAVENOUS | Status: DC

## 2016-04-29 MED ORDER — KETOROLAC TROMETHAMINE 15 MG/ML IJ SOLN
15.0000 mg | Freq: Four times a day (QID) | INTRAMUSCULAR | Status: DC | PRN
  Administered 2016-04-29 – 2016-04-30 (×3): 15 mg via INTRAVENOUS
  Filled 2016-04-29 (×3): qty 1

## 2016-04-29 NOTE — Progress Notes (Signed)
PROGRESS NOTE  ODETTA FORNESS  EVO:[id number] DOB: 1955-10-22 DOA: 04/21/2016 PCP: No primary care provider on file.  Outpatient Specialists: [facility], Dr. Scot Dock  Brief Narrative: CHRIS NARASIMHAN is a 61 y.o. female with a past medical history significant for ESRD on HD MWF, s/p Liver transplant for hep C followed at [facility], AS s/p AVR and CABG in 2015 on warfarin who presented with acute worsening of chronic foot pain due to PVD. Her vascular surgeon, Dr. Scot Dock, had planned BKA 3/20,  she became unable to control the pain, and presented 3/12. Pain described as constant, excruciating and not relieved by oral narcotics at home. On presentation, she was afebrile, heart rate 93, respirations 21, BP 210/88, SpO2 96%. INR 3.0, WBC 8.7, hgb 13.8. The patient was admitted for pain control, coumadin held and started on heparin. A stress myoview was ordered for surgical risk stratification. Vascular surgery  consulted and now she is status post inpatient BKA  .Left below the knee amputation performed 3/15    Assessment & Plan: Principal Problem:   Claudication of left lower extremity ([place]) Active Problems:   Hypothyroidism   Type 2 diabetes mellitus with chronic kidney disease ([place])   Anemia due to chronic renal failure    End stage renal disease on dialysis [facility])   CAD (coronary artery disease), native coronary artery   History of prosthetic aortic valve replacement   Chronic combined systolic and diastolic congestive heart failure (H[facility])   COPD (chronic obstructive pulmonary disease) (H[facility])   Chronic anticoagulation w/ coumadin, goal INR 2.5-3.0 w/ AVR   S/P liver transplant ([place])   Foot pain   Critical lower limb ischemia   Acute on chronic peripheral vascular disease with dry gangrene and intractable ischemic pain:  status post BKA    Continue tramadol, Toradol , oxycodone , discontinued Dilaudid, OxyContin due to oversedation  Zofran prn nausea [facility] performed BKA 3/15,   preop  clearance per cardiology obtained prior to surgery-NM stress test  ,Myoview perf was low risk with anterolateral scar and no ischemia. Cleared by cardiology hg drifting down    ESRD on HD MWF: Sees Dr. Justin Mend as outpatient. - HD per nephrology, - Continue phoslo  Liver transplant: Followed at [facility] hepatology.  - Continue prograf.    transplant hepatologist on-call who reviewed the records, recommends continue tacrolimus at this dose.   Aortic stenosis s/p mechanical AVR: On warfarin.  Per Dr. Evette Georges note, will need bridging.   Cardiology recommended  heparin gtt when INR less than 2.5,   since she has a [employer]. Jude's mechanical valve, needs bridging once her Coumadin is held to reduce the likelihood of prosthetic valve thrombosis.  Status post vitamin K 2 mg IV on 3/14, INR subtherapeutic for several days now 1.78>1.8>1.72>1.9 Continue heparin drip until INR greater than 2.5   CAD s/p CABG: Status post Lexiscan Myoview.   - Myoview  was low risk with anterolateral scar and no ischemia. Deemed low risk to have her BKA  - Continue aspirin, metoprolol  Anemia of chronic disease Hemoglobin has drifted down from 12.2> 9.6>9.3>8.7>8.3 Transfuse 2 units with hemodialysis tomorrow 3/21 Platelet count is stable    Hypertension:  - Treat pain - Continue metoprolol - HD per nephrology - Hydralazine prn ordered  Hypothyroidism:  - Continue levothyroxine Last TSH 4.8, 03/06/16   NSVT  EF 40-45%, will continue coreg  replete K , magnesium   DVT prophylaxis: Heparin gtt Code Status: DNR Family Communication: None at bedside  Disposition Plan:  BKA 3/15, pending establishment of therapeutic INR,  CIR discharge  when INR >2.5   Consultants:   [facility], Dr. Scot Dock  Cardiology  Nephrology   Procedures:   Myoview 3/13    Antimicrobials:  None    Subjective: Pain in her leg seems to be controlled today  Objective: Vitals:   04/28/16 1939 04/28/16 2035 04/29/16 0319  04/29/16 0449  BP:  (!) 137/47 (!) 116/49 (!) 103/51  Pulse:  73 78 77  Resp:  18 18 18   Temp:  98.4 F (36.9 C) 99.6 F (37.6 C) 98.9 F (37.2 C)  TempSrc:  Oral Oral Oral  SpO2:  98% 98% 100%  Weight: 54.8 kg (120 lb 13 oz)  54.3 kg (119 lb 11.2 oz)   Height:        Intake/Output Summary (Last 24 hours) at 04/29/16 0818 Last data filed at 04/29/16 1245  Gross per 24 hour  Intake          1092.71 ml  Output             2001 ml  Net          -908.29 ml   Filed Weights   04/28/16 1156 04/28/16 1939 04/29/16 0319  Weight: 55.4 kg (122 lb 2.2 oz) 54.8 kg (120 lb 13 oz) 54.3 kg (119 lb 11.2 oz)   Examination: General exam: 61 y.o. female in evident pain Respiratory system: Non-labored breathing ambient air. Clear to auscultation bilaterally.  Cardiovascular system: Regular rate and rhythm. No murmur, rub, or gallop. No JVD, and no pedal edema. 2+ radial pulses. Absent DP pulses. Left thigh graft +thrill.  Gastrointestinal system: Abdomen soft, non-tender, non-distended, with normoactive bowel sounds. No organomegaly or masses felt. Central nervous system: Alert and oriented. No focal neurological deficits. Extremities:  s/p: left BKA Psychiatry: Judgement and insight appear normal. Mood & affect appropriate.   Data Reviewed: I have personally reviewed following labs and imaging studies  CBC:  Recent Labs Lab 04/25/16 2038 04/26/16 0531 04/27/16 0541 04/28/16 0532 04/29/16 0527  WBC 11.6* 14.1* 14.9* 15.4* 15.3*  HGB 10.2* 9.6* 9.3* 8.7* 8.3*  HCT 30.1* 28.2* 27.4* 25.7* 24.8*  MCV 86.0 84.7 85.4 84.3 84.4  PLT 95* 89* 124* 142* 809   Basic Metabolic Panel:  Recent Labs Lab 04/23/16 1442 04/23/16 1930 04/24/16 0104 04/25/16 0332 04/26/16 0531 04/27/16 0541 04/28/16 0532  NA 138  --  133* 134* 131* 132* 132*  K 3.1*  --  3.6 4.2 3.6 3.8 3.9  CL 97*  --  99* 99* 97* 97* 96*  CO2 31  --  25 26 26 24 24   GLUCOSE 112*  --  95 198* 157* 144* 159*  BUN 15  --  6  15 13  25* 38*  CREATININE 6.21*  --  3.42* 4.93* 3.56* 5.13* 6.60*  CALCIUM 8.2*  --  8.1* 8.0* 8.1* 8.3* 8.0*  MG  --  1.9  --   --   --   --   --   PHOS 5.0*  --   --   --   --   --   --    GFR: Estimated Creatinine Clearance: 7.7 mL/min (A) (by C-G formula based on SCr of 6.6 mg/dL (H)). Liver Function Tests:  Recent Labs Lab 04/23/16 1442 04/24/16 0104 04/25/16 0332  AST  --  68* 55*  ALT  --  17 16  ALKPHOS  --  253* 214*  BILITOT  --  1.6* 0.8  PROT  --  6.1* 5.3*  ALBUMIN 1.8* 1.8* 1.5*   No results for input(s): LIPASE, AMYLASE in the last 168 hours. No results for input(s): AMMONIA in the last 168 hours. Coagulation Profile:  Recent Labs Lab 04/25/16 0332 04/26/16 0531 04/27/16 0541 04/28/16 0532 04/29/16 0527  INR 1.78 1.77 1.80 1.72 1.90   Cardiac Enzymes: No results for input(s): CKTOTAL, CKMB, CKMBINDEX, TROPONINI in the last 168 hours. BNP (last 3 results) No results for input(s): PROBNP in the last 8760 hours. HbA1C: No results for input(s): HGBA1C in the last 72 hours. CBG:  Recent Labs Lab 04/27/16 2013 04/28/16 1216 04/28/16 1625 04/28/16 2129 04/29/16 0737  GLUCAP 154* 112* 115* 114* 106*   Lipid Profile: No results for input(s): CHOL, HDL, LDLCALC, TRIG, CHOLHDL, LDLDIRECT in the last 72 hours. Thyroid Function Tests: No results for input(s): TSH, T4TOTAL, FREET4, T3FREE, THYROIDAB in the last 72 hours. Anemia Panel: No results for input(s): VITAMINB12, FOLATE, FERRITIN, TIBC, IRON, RETICCTPCT in the last 72 hours. Urine analysis:    Component Value Date/Time   COLORURINE YELLOW 11/02/2012 0428   APPEARANCEUR CLOUDY (A) 11/02/2012 0428   LABSPEC 1.011 11/02/2012 0428   PHURINE 8.0 11/02/2012 0428   GLUCOSEU 250 (A) 11/02/2012 0428   HGBUR LARGE (A) 11/02/2012 0428   BILIRUBINUR NEGATIVE 11/02/2012 0428   KETONESUR NEGATIVE 11/02/2012 0428   PROTEINUR >300 (A) 11/02/2012 0428   UROBILINOGEN 0.2 11/02/2012 0428   NITRITE  NEGATIVE 11/02/2012 0428   LEUKOCYTESUR NEGATIVE 11/02/2012 0428   Recent Results (from the past 240 hour(s))  Surgical pcr screen     Status: None   Collection Time: 04/23/16 10:40 PM  Result Value Ref Range Status   MRSA, PCR NEGATIVE NEGATIVE Final   Staphylococcus aureus NEGATIVE NEGATIVE Final    Comment:        The Xpert SA Assay (FDA approved for NASAL specimens in patients over 77 years of age), is one component of a comprehensive surveillance program.  Test performance has been validated by [facility] for patients greater than or equal to 40 year old. It is not intended to diagnose infection nor to guide or monitor treatment.       Radiology Studies: No results found.  Scheduled Meds: . aspirin EC  81 mg Oral Daily  . calcitRIOL  0.5 mcg Oral Q M,W,F-HD  . calcium acetate  667 mg Oral TID WC  . darbepoetin (ARANESP) injection - DIALYSIS  100 mcg Intravenous Q Mon-HD  . feeding supplement  1 Container Oral TID BM  . feeding supplement (PRO-STAT SUGAR FREE 64)  30 mL Oral BID  . gabapentin  100 mg Oral Q M,W,F-HD  . gabapentin  300 mg Oral QHS  . insulin aspart  0-5 Units Subcutaneous QHS  . insulin aspart  0-9 Units Subcutaneous TID WC  . levothyroxine  175 mcg Oral QAC breakfast  . metoprolol tartrate  25 mg Oral BID  . multivitamin  1 tablet Oral QHS  . pantoprazole  40 mg Oral Daily  . senna-docusate  2 tablet Oral QHS  . tacrolimus  2 mg Oral BID  . Warfarin - Pharmacist Dosing Inpatient   Does not apply q1800   Continuous Infusions: . heparin 1,300 Units/hr (04/29/16 0338)     LOS: 7 days   Time spent: 25 minutes.  Reyne Dumas, MD [facility] Pager [phone]  If 7PM-7AM, please contact night-coverage [email or web address] Password [facility] 04/29/2016, 8:18 AM

## 2016-04-29 NOTE — Progress Notes (Signed)
Physical Therapy Treatment Patient Details Name: [name] MRN: [id number] DOB: 08-26-1955 Today's Date: 04/29/2016    History of Present Illness  [name] is a 61 y.o. 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    PT Comments    Progressing steadily.  Emphasis on BKA exercise, transfer technique and swing to gait in a RW.  Reinforced positioning and posture as well as stump wrapping.   Follow Up Recommendations  CIR     Equipment Recommendations  3in1 (PT);Wheelchair (measurements PT);Wheelchair cushion (measurements PT)    Recommendations for Other Services Rehab consult     Precautions / Restrictions Precautions Precautions: Fall Restrictions Weight Bearing Restrictions: Yes LLE Weight Bearing: Non weight bearing    Mobility  Bed Mobility Overal bed mobility: Needs Assistance Bed Mobility: Rolling;Sidelying to Sit Rolling: Min guard Sidelying to sit: Min guard (use of the rail)       General bed mobility comments: use of rail to roll and help push up, no assist needed  Transfers Overall transfer level: Needs assistance Equipment used: Rolling walker (2 wheeled) Transfers: Sit to/from Stand Sit to Stand: Mod assist;+2 safety/equipment         General transfer comment: Cues for hand placement and assist to come both forward and up.  Ambulation/Gait Ambulation/Gait assistance: Min assist Ambulation Distance (Feet): 5 Feet (and additional 7 feet) Assistive device: Rolling walker (2 wheeled) Gait Pattern/deviations: Step-to pattern     General Gait Details: worked of technique for standing, safe position in the RW.  Swing to pattern.  Pt can not keep her UE's locked at the elbow for long and therefore does not have a confident swing to.   Stairs             Wheelchair Mobility    Modified Rankin (Stroke Patients Only)       Balance Overall balance assessment: Needs assistance Sitting-balance support: Bilateral upper extremity supported;Feet supported Sitting balance-Leahy Scale: Fair     Standing balance support: Bilateral upper extremity supported;During functional activity Standing balance-Leahy Scale: Poor Standing balance comment: pt still having trouble maintaining balance without external support                    Cognition Arousal/Alertness: Awake/alert Behavior During Therapy: WFL for tasks assessed/performed Overall Cognitive Status: Within Functional Limits for tasks assessed                      Exercises Amputee Exercises Quad Sets: AROM;Strengthening;Both;10 reps;Supine Hip Extension: AROM;Strengthening;Left;10 reps;Seated Hip Flexion/Marching: AROM;Both;10 reps;Seated    General Comments        Pertinent Vitals/Pain Pain Assessment: Faces Faces Pain Scale: Hurts little more Pain Location: L LE Pain Descriptors / Indicators: Grimacing;Guarding Pain Intervention(s): Monitored during session;Repositioned    Home Living                      Prior Function            PT Goals (current goals can now be found in the care plan section) Acute Rehab PT Goals Patient Stated Goal: Get stronger and go home PT Goal Formulation: With patient Time For Goal Achievement: 05/09/16 Potential to Achieve Goals: Good Progress towards PT goals: Progressing toward goals    Frequency    Min  3X/week      PT Plan Current plan remains appropriate    Co-evaluation             End of Session Equipment Utilized During Treatment: Gait belt Activity Tolerance: Patient tolerated treatment well Patient left: in chair;with call bell/phone within reach;with chair alarm set Nurse Communication: Mobility status PT Visit Diagnosis: Unsteadiness on feet (R26.81);Muscle weakness  (generalized) (M62.81);Pain Pain - Right/Left: Left Pain - part of body: Leg     Time: 4332-9518 PT Time Calculation (min) (ACUTE ONLY): 28 min  Charges:  $Gait Training: 8-22 mins $Therapeutic Exercise: 8-22 mins                    G CodesTessie Fass Zi [place] 04/29/2016, 11:14 AM 04/29/2016  [name], [place] [phone]  (pager)

## 2016-04-29 NOTE — Progress Notes (Signed)
Rehab admissions - I spoke with attending MD and she says patient likely ready for inpatient rehab tomorrow.  I will follow up tomorrow.  Call me for questions.  #[phone]

## 2016-04-29 NOTE — Progress Notes (Signed)
   VASCULAR SURGERY ASSESSMENT & PLAN:  5 Days Post-Op s/p: Left BKA  BKA is healing nicely.  Doing better at keeping left knee straight today.  SUBJECTIVE: Pain better controlled  PHYSICAL EXAM: Vitals:   04/28/16 2035 04/29/16 0319 04/29/16 0449 04/29/16 1000  BP: (!) 137/47 (!) 116/49 (!) 103/51 (!) 111/51  Pulse: 73 78 77 75  Resp: 18 18 18 20   Temp: 98.4 F (36.9 C) 99.6 F (37.6 C) 98.9 F (37.2 C) 98.7 F (37.1 C)  TempSrc: Oral Oral Oral Oral  SpO2: 98% 98% 100% 97%  Weight:  119 lb 11.2 oz (54.3 kg)    Height:       Left BKA inspected and looks good. Dressing changed.   LABS: Lab Results  Component Value Date   WBC 15.3 (H) 04/29/2016   HGB 8.3 (L) 04/29/2016   HCT 24.8 (L) 04/29/2016   MCV 84.4 04/29/2016   PLT 193 04/29/2016   Lab Results  Component Value Date   CREATININE 6.60 (H) 04/28/2016   Lab Results  Component Value Date   INR 1.90 04/29/2016   CBG (last 3)   Recent Labs  04/28/16 2129 04/29/16 0737 04/29/16 1211  GLUCAP 114* 106* 169*    Principal Problem:   Claudication of left lower extremity (HCC) Active Problems:   Hypothyroidism   Type 2 diabetes mellitus with chronic kidney disease ([place])   Anemia due to chronic renal failure    End stage renal disease on dialysis ([place])   CAD (coronary artery disease), native coronary artery   History of prosthetic aortic valve replacement   Chronic combined systolic and diastolic congestive heart failure (HCC)   COPD (chronic obstructive pulmonary disease) (HCC)   Chronic anticoagulation w/ coumadin, goal INR 2.5-3.0 w/ AVR   S/P liver transplant ([place])   Foot pain   Critical lower limb ischemia    Gae Gallop Beeper: [phone] 04/29/2016

## 2016-04-29 NOTE — Progress Notes (Signed)
ANTICOAGULATION CONSULT NOTE - Follow Up Consult  Pharmacy Consult for Heparin bridge to Warfarin Indication: AVR  Allergies  Allergen Reactions  . Acetaminophen Other (See Comments)    Liver transplant recipient   . Morphine Itching and Other (See Comments)    hallucinate  . Codeine Itching  . Mirtazapine Other (See Comments)    hallucination   Patient Measurements: Height: 5' 4.5" (163.8 cm) (from 04/17/16) Weight: 119 lb 11.2 oz (54.3 kg) IBW/kg (Calculated) : 55.85  Vital Signs: Temp: 98.9 F (37.2 C) (03/20 0449) Temp Source: Oral (03/20 0449) BP: 103/51 (03/20 0449) Pulse Rate: 77 (03/20 0449)  Assessment: 61 yo F on Coumadin 2mg  daily PTA for h/o mechanical aortic valve. Goal INR 2.5-3.5. Patient came to ED 3/12 with severe L foot pain. 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 Patient was changed to heparin on admission, now restarting warfarin with heparin bridge.   INR 1.9, heparin level in range at 0.34 units/mL.  Hgb 8.3, plts 193- no bleeding noted. Received dose of Aranesp yesterday in dialysis, not on IV iron.  PTA warfarin dose is 2mg  daily.  Goal of Therapy:  Heparin level 0.3-0.7 units/ml  INR 2.5-3.5 Monitor platelets by anticoagulation protocol: Yes   Plan:  Heparin at 1300 units/hr IV  Warfarin 4mg  PO x1 again tonight Monitor daily heparin level, INR, CBC, s/s of bleed  George Haggart D. Travares Nelles, PharmD, BCPS Clinical Pharmacist Pager: [phone] 04/29/2016 10:22 AM

## 2016-04-29 NOTE — Progress Notes (Signed)
Orthopedic Tech Progress Note Patient Details:  [name] [birth year] [id number]  Ortho Devices Type of Ortho Device: Knee Immobilizer Ortho Device/Splint Interventions: Application   [name] 04/29/2016, 12:34 PM

## 2016-04-29 NOTE — Progress Notes (Signed)
[facility] Progress Note   Subjective: no c/o  Vitals:   04/28/16 1939 04/28/16 2035 04/29/16 0319 04/29/16 0449  BP:  (!) 137/47 (!) 116/49 (!) 103/51  Pulse:  73 78 77  Resp:  18 18 18   Temp:  98.4 F (36.9 C) 99.6 F (37.6 C) 98.9 F (37.2 C)  TempSrc:  Oral Oral Oral  SpO2:  98% 98% 100%  Weight: 54.8 kg (120 lb 13 oz)  54.3 kg (119 lb 11.2 oz)   Height:        Inpatient medications: . aspirin EC  81 mg Oral Daily  . calcitRIOL  0.5 mcg Oral Q M,W,F-HD  . calcium acetate  667 mg Oral TID WC  . darbepoetin (ARANESP) injection - DIALYSIS  100 mcg Intravenous Q Mon-HD  . feeding supplement  1 Container Oral TID BM  . feeding supplement (PRO-STAT SUGAR FREE 64)  30 mL Oral BID  . gabapentin  100 mg Oral Q M,W,F-HD  . gabapentin  300 mg Oral QHS  . insulin aspart  0-5 Units Subcutaneous QHS  . insulin aspart  0-9 Units Subcutaneous TID WC  . levothyroxine  175 mcg Oral QAC breakfast  . metoprolol tartrate  25 mg Oral BID  . multivitamin  1 tablet Oral QHS  . pantoprazole  40 mg Oral Daily  . senna-docusate  2 tablet Oral QHS  . tacrolimus  2 mg Oral BID  . Warfarin - Pharmacist Dosing Inpatient   Does not apply q1800   . heparin 1,300 Units/hr (04/29/16 0338)   alum & mag hydroxide-simeth, famotidine, fentaNYL (SUBLIMAZE) injection, guaiFENesin-dextromethorphan, hydrALAZINE, HYDROmorphone (DILAUDID) injection, labetalol, magnesium sulfate 1 - 4 g bolus IVPB, metoprolol, ondansetron **OR** ondansetron (ZOFRAN) IV, oxyCODONE, phenol, polyethylene glycol, senna-docusate, traMADol  Exam: Alert, no distress, calm No jvd Chest clear bilat RRR +valve click Abd soft ntnd L BKA wrapped, no RLE edema R IJ TDC in place NF, ox 3  Dialysis: [name] MWF 3h 81min   2/2 bath  58kg   Hep none   R IJ cath - calcitriol 0.5 ug tiw - no ESA, last Mircera was on 2/22 - labs > last Hb 11.8, tsat 32%, pth 573, P 5      Assessment: 1. L BKA 3/15 - POD #5 2. ESRD MWF  HD 3. Anemia of CKD - got esa 100 ug on 3/19 4. CKD-MBD - cont meds 5. Volume - lower dry wt 3-4kg after BKA 6. Hx AVR on coumadin 7. Hx liver Tx - on prograf only  8. DM on insulin 9. DNR  Plan - HD Gretta Arab MD [facility] pager [phone]   04/29/2016, 6:46 AM    Recent Labs Lab 04/23/16 1442  04/26/16 0531 04/27/16 0541 04/28/16 0532  NA 138  < > 131* 132* 132*  K 3.1*  < > 3.6 3.8 3.9  CL 97*  < > 97* 97* 96*  CO2 31  < > 26 24 24   GLUCOSE 112*  < > 157* 144* 159*  BUN 15  < > 13 25* 38*  CREATININE 6.21*  < > 3.56* 5.13* 6.60*  CALCIUM 8.2*  < > 8.1* 8.3* 8.0*  PHOS 5.0*  --   --   --   --   < > = values in this interval not displayed.  Recent Labs Lab 04/23/16 1442 04/24/16 0104 04/25/16 0332  AST  --  68* 55*  ALT  --  17 16  ALKPHOS  --  253* 214*  BILITOT  --  1.6* 0.8  PROT  --  6.1* 5.3*  ALBUMIN 1.8* 1.8* 1.5*    Recent Labs Lab 04/26/16 0531 04/27/16 0541 04/28/16 0532  WBC 14.1* 14.9* 15.4*  HGB 9.6* 9.3* 8.7*  HCT 28.2* 27.4* 25.7*  MCV 84.7 85.4 84.3  PLT 89* 124* 142*   Iron/TIBC/Ferritin/ %Sat    Component Value Date/Time   IRON 22 (L) 03/12/2016 1308   TIBC 181 (L) 03/12/2016 1308   FERRITIN 1,095 (H) 08/30/2012 1449   IRONPCTSAT 12 03/12/2016 1308

## 2016-04-29 NOTE — Progress Notes (Signed)
Upon entering room to check on patient at [place], I note that patient is in the chair with daughter at bedside. Prior to, patient was in bed with bed alarm on. Patient and daughter educated prior to that PT/OT worked with patient today and was unable to get patient in chair D/T increased weakness and confusion. Daughter verbalized that she understood. When asked who got the patient to the chair, the daughter stated that she did by herself. Daughter then stated that the patient had fallen when the daughter went to the bathroom at approximately 1845. As the daughter was coming out of the bathroom, she stated that she saw the patient slide out of the chair onto her buttocks. The daughter stated that the patient did not hit her head and she assisted the patient back into the chair. Patient is not complaining of pain or any further injuries. Assessment completed, vitals stable, no abnormalities noted from previous assessment. Patient and family member educated on the importance of asking for assistance with transfer to maintain patient safety. Nightshift RN notified of this information.  Dixie Dials, RN

## 2016-04-29 NOTE — Care Management Important Message (Signed)
Important Message  Patient Details  Name: [name] MRN: [id number] Date of Birth: Aug 14, 1955   Medicare Important Message Given:  Yes    Orbie Pyo 04/29/2016, 3:49 PM

## 2016-04-30 ENCOUNTER — Inpatient Hospital Stay (HOSPITAL_COMMUNITY)
Admission: RE | Admit: 2016-04-30 | Discharge: 2016-05-20 | DRG: 559 | Disposition: A | Discharge status: Home Health | Payer: Medicare Other | Source: Intra-hospital | Attending: Physical Medicine & Rehabilitation | Admitting: Physical Medicine & Rehabilitation

## 2016-04-30 ENCOUNTER — Encounter (HOSPITAL_COMMUNITY): Payer: Self-pay | Admitting: Physical Medicine & Rehabilitation

## 2016-04-30 DIAGNOSIS — Z89512 Acquired absence of left leg below knee: Secondary | ICD-10-CM

## 2016-04-30 DIAGNOSIS — E1129 Type 2 diabetes mellitus with other diabetic kidney complication: Secondary | ICD-10-CM | POA: Diagnosis not present

## 2016-04-30 DIAGNOSIS — J42 Unspecified chronic bronchitis: Secondary | ICD-10-CM

## 2016-04-30 DIAGNOSIS — Z794 Long term (current) use of insulin: Secondary | ICD-10-CM

## 2016-04-30 DIAGNOSIS — G8929 Other chronic pain: Secondary | ICD-10-CM | POA: Diagnosis not present

## 2016-04-30 DIAGNOSIS — I10 Essential (primary) hypertension: Secondary | ICD-10-CM | POA: Diagnosis not present

## 2016-04-30 DIAGNOSIS — Z7901 Long term (current) use of anticoagulants: Secondary | ICD-10-CM | POA: Diagnosis not present

## 2016-04-30 DIAGNOSIS — F329 Major depressive disorder, single episode, unspecified: Secondary | ICD-10-CM | POA: Diagnosis not present

## 2016-04-30 DIAGNOSIS — Z66 Do not resuscitate: Secondary | ICD-10-CM

## 2016-04-30 DIAGNOSIS — Z992 Dependence on renal dialysis: Secondary | ICD-10-CM

## 2016-04-30 DIAGNOSIS — E118 Type 2 diabetes mellitus with unspecified complications: Secondary | ICD-10-CM | POA: Diagnosis not present

## 2016-04-30 DIAGNOSIS — S88112A Complete traumatic amputation at level between knee and ankle, left lower leg, initial encounter: Secondary | ICD-10-CM | POA: Diagnosis present

## 2016-04-30 DIAGNOSIS — D72829 Elevated white blood cell count, unspecified: Secondary | ICD-10-CM

## 2016-04-30 DIAGNOSIS — M545 Low back pain: Secondary | ICD-10-CM

## 2016-04-30 DIAGNOSIS — R269 Unspecified abnormalities of gait and mobility: Secondary | ICD-10-CM

## 2016-04-30 DIAGNOSIS — E1140 Type 2 diabetes mellitus with diabetic neuropathy, unspecified: Secondary | ICD-10-CM | POA: Diagnosis not present

## 2016-04-30 DIAGNOSIS — Z952 Presence of prosthetic heart valve: Secondary | ICD-10-CM

## 2016-04-30 DIAGNOSIS — G546 Phantom limb syndrome with pain: Secondary | ICD-10-CM

## 2016-04-30 DIAGNOSIS — E43 Unspecified severe protein-calorie malnutrition: Secondary | ICD-10-CM

## 2016-04-30 DIAGNOSIS — T8640 Unspecified complication of liver transplant: Secondary | ICD-10-CM | POA: Diagnosis not present

## 2016-04-30 DIAGNOSIS — I25810 Atherosclerosis of coronary artery bypass graft(s) without angina pectoris: Secondary | ICD-10-CM

## 2016-04-30 DIAGNOSIS — D638 Anemia in other chronic diseases classified elsewhere: Secondary | ICD-10-CM | POA: Diagnosis not present

## 2016-04-30 DIAGNOSIS — E039 Hypothyroidism, unspecified: Secondary | ICD-10-CM

## 2016-04-30 DIAGNOSIS — N2581 Secondary hyperparathyroidism of renal origin: Secondary | ICD-10-CM | POA: Diagnosis not present

## 2016-04-30 DIAGNOSIS — Z4781 Encounter for orthopedic aftercare following surgical amputation: Secondary | ICD-10-CM | POA: Diagnosis not present

## 2016-04-30 DIAGNOSIS — I120 Hypertensive chronic kidney disease with stage 5 chronic kidney disease or end stage renal disease: Secondary | ICD-10-CM

## 2016-04-30 DIAGNOSIS — M898X9 Other specified disorders of bone, unspecified site: Secondary | ICD-10-CM

## 2016-04-30 DIAGNOSIS — E1122 Type 2 diabetes mellitus with diabetic chronic kidney disease: Secondary | ICD-10-CM | POA: Diagnosis not present

## 2016-04-30 DIAGNOSIS — J449 Chronic obstructive pulmonary disease, unspecified: Secondary | ICD-10-CM | POA: Diagnosis not present

## 2016-04-30 DIAGNOSIS — I70229 Atherosclerosis of native arteries of extremities with rest pain, unspecified extremity: Secondary | ICD-10-CM

## 2016-04-30 DIAGNOSIS — Z7982 Long term (current) use of aspirin: Secondary | ICD-10-CM

## 2016-04-30 DIAGNOSIS — R112 Nausea with vomiting, unspecified: Secondary | ICD-10-CM

## 2016-04-30 DIAGNOSIS — Z944 Liver transplant status: Secondary | ICD-10-CM | POA: Diagnosis not present

## 2016-04-30 DIAGNOSIS — L89619 Pressure ulcer of right heel, unspecified stage: Secondary | ICD-10-CM

## 2016-04-30 DIAGNOSIS — Z6820 Body mass index (BMI) 20.0-20.9, adult: Secondary | ICD-10-CM

## 2016-04-30 DIAGNOSIS — D631 Anemia in chronic kidney disease: Secondary | ICD-10-CM

## 2016-04-30 DIAGNOSIS — M79671 Pain in right foot: Secondary | ICD-10-CM | POA: Diagnosis not present

## 2016-04-30 DIAGNOSIS — F17210 Nicotine dependence, cigarettes, uncomplicated: Secondary | ICD-10-CM | POA: Diagnosis not present

## 2016-04-30 DIAGNOSIS — Z951 Presence of aortocoronary bypass graft: Secondary | ICD-10-CM | POA: Diagnosis not present

## 2016-04-30 DIAGNOSIS — N186 End stage renal disease: Secondary | ICD-10-CM

## 2016-04-30 DIAGNOSIS — E1151 Type 2 diabetes mellitus with diabetic peripheral angiopathy without gangrene: Secondary | ICD-10-CM

## 2016-04-30 DIAGNOSIS — K219 Gastro-esophageal reflux disease without esophagitis: Secondary | ICD-10-CM

## 2016-04-30 DIAGNOSIS — R791 Abnormal coagulation profile: Secondary | ICD-10-CM

## 2016-04-30 DIAGNOSIS — I2510 Atherosclerotic heart disease of native coronary artery without angina pectoris: Secondary | ICD-10-CM

## 2016-04-30 DIAGNOSIS — K5909 Other constipation: Secondary | ICD-10-CM | POA: Diagnosis not present

## 2016-04-30 DIAGNOSIS — L89152 Pressure ulcer of sacral region, stage 2: Secondary | ICD-10-CM

## 2016-04-30 DIAGNOSIS — K5900 Constipation, unspecified: Secondary | ICD-10-CM

## 2016-04-30 DIAGNOSIS — Z79899 Other long term (current) drug therapy: Secondary | ICD-10-CM | POA: Diagnosis not present

## 2016-04-30 DIAGNOSIS — R52 Pain, unspecified: Secondary | ICD-10-CM | POA: Diagnosis not present

## 2016-04-30 DIAGNOSIS — M62838 Other muscle spasm: Secondary | ICD-10-CM

## 2016-04-30 DIAGNOSIS — R5381 Other malaise: Secondary | ICD-10-CM

## 2016-04-30 DIAGNOSIS — F4323 Adjustment disorder with mixed anxiety and depressed mood: Secondary | ICD-10-CM | POA: Diagnosis not present

## 2016-04-30 DIAGNOSIS — Z89511 Acquired absence of right leg below knee: Secondary | ICD-10-CM

## 2016-04-30 DIAGNOSIS — Z8669 Personal history of other diseases of the nervous system and sense organs: Secondary | ICD-10-CM

## 2016-04-30 DIAGNOSIS — D62 Acute posthemorrhagic anemia: Secondary | ICD-10-CM | POA: Diagnosis not present

## 2016-04-30 HISTORY — DX: Acquired absence of left leg below knee: Z89.511

## 2016-04-30 LAB — CBC
HCT: 23.8 % — ABNORMAL LOW (ref 36.0–46.0)
Hemoglobin: 8 g/dL — ABNORMAL LOW (ref 12.0–15.0)
MCH: 29.3 pg (ref 26.0–34.0)
MCHC: 33.6 g/dL (ref 30.0–36.0)
MCV: 87.2 fL (ref 78.0–100.0)
Platelets: 184 10*3/uL (ref 150–400)
RBC: 2.73 MIL/uL — ABNORMAL LOW (ref 3.87–5.11)
RDW: 17.9 % — ABNORMAL HIGH (ref 11.5–15.5)
WBC: 13.9 10*3/uL — ABNORMAL HIGH (ref 4.0–10.5)

## 2016-04-30 LAB — BASIC METABOLIC PANEL
ANION GAP: 12 (ref 5–15)
BUN: 26 mg/dL — ABNORMAL HIGH (ref 6–20)
CO2: 21 mmol/L — ABNORMAL LOW (ref 22–32)
CREATININE: 5.35 mg/dL — AB (ref 0.44–1.00)
Calcium: 8.2 mg/dL — ABNORMAL LOW (ref 8.9–10.3)
Chloride: 99 mmol/L — ABNORMAL LOW (ref 101–111)
GFR, EST AFRICAN AMERICAN: 9 mL/min — AB (ref >60)
GFR, EST NON AFRICAN AMERICAN: 8 mL/min — AB (ref >60)
Glucose, Bld: 120 mg/dL — ABNORMAL HIGH (ref 65–99)
Potassium: 4.1 mmol/L (ref 3.5–5.1)
SODIUM: 132 mmol/L — AB (ref 135–145)

## 2016-04-30 LAB — PROTIME-INR
INR: 2.53
Prothrombin Time: 27.8 seconds — ABNORMAL HIGH (ref 11.4–15.2)

## 2016-04-30 LAB — GLUCOSE, CAPILLARY
GLUCOSE-CAPILLARY: 131 mg/dL — AB (ref 65–99)
GLUCOSE-CAPILLARY: 142 mg/dL — AB (ref 65–99)
Glucose-Capillary: 122 mg/dL — ABNORMAL HIGH (ref 65–99)
Glucose-Capillary: 104 mg/dL — ABNORMAL HIGH (ref 65–99)

## 2016-04-30 LAB — HEPARIN LEVEL (UNFRACTIONATED): Heparin Unfractionated: 0.44 IU/mL (ref 0.30–0.70)

## 2016-04-30 MED ORDER — PROCHLORPERAZINE 25 MG RE SUPP: 12.5000 mg | Freq: Four times a day (QID) | RECTAL | Status: DC | PRN

## 2016-04-30 MED ORDER — TRAMADOL HCL 50 MG PO TABS: 50.0000 mg | ORAL_TABLET | Freq: Four times a day (QID) | ORAL | 0 refills | Status: DC | PRN

## 2016-04-30 MED ORDER — SENNOSIDES-DOCUSATE SODIUM 8.6-50 MG PO TABS
2.0000 | ORAL_TABLET | Freq: Every evening | ORAL | Status: DC | PRN
  Administered 2016-05-03: 2 via ORAL
  Filled 2016-04-30: qty 2

## 2016-04-30 MED ORDER — PENTAFLUOROPROP-TETRAFLUOROETH EX AERO: 1.0000 "application " | INHALATION_SPRAY | CUTANEOUS | Status: DC | PRN

## 2016-04-30 MED ORDER — ONDANSETRON HCL 4 MG/2ML IJ SOLN: 4.0000 mg | Freq: Four times a day (QID) | INTRAMUSCULAR | Status: DC | PRN

## 2016-04-30 MED ORDER — ASPIRIN EC 81 MG PO TBEC
81.0000 mg | DELAYED_RELEASE_TABLET | Freq: Every day | ORAL | Status: DC
  Administered 2016-05-01 – 2016-05-20 (×20): 81 mg via ORAL
  Filled 2016-04-30 (×19): qty 1

## 2016-04-30 MED ORDER — OXYCODONE HCL 5 MG PO TABS
5.0000 mg | ORAL_TABLET | ORAL | Status: DC | PRN
  Administered 2016-04-30 – 2016-05-05 (×14): 5 mg via ORAL
  Filled 2016-04-30 (×14): qty 1

## 2016-04-30 MED ORDER — GABAPENTIN 300 MG PO CAPS
300.0000 mg | ORAL_CAPSULE | Freq: Every day | ORAL | Status: DC
  Administered 2016-04-30 – 2016-05-12 (×12): 300 mg via ORAL
  Filled 2016-04-30 (×13): qty 1

## 2016-04-30 MED ORDER — PRO-STAT SUGAR FREE PO LIQD
30.0000 mL | Freq: Two times a day (BID) | ORAL | Status: DC
  Administered 2016-04-30 – 2016-05-20 (×24): 30 mL via ORAL
  Filled 2016-04-30 (×35): qty 30

## 2016-04-30 MED ORDER — HEPARIN SODIUM (PORCINE) 1000 UNIT/ML DIALYSIS
1000.0000 [IU] | INTRAMUSCULAR | Status: DC | PRN
  Filled 2016-04-30: qty 1

## 2016-04-30 MED ORDER — DIPHENHYDRAMINE HCL 12.5 MG/5ML PO ELIX
12.5000 mg | ORAL_SOLUTION | Freq: Four times a day (QID) | ORAL | Status: DC | PRN
Start: 2016-04-30 — End: 2016-05-20
  Administered 2016-05-03: 25 mg via ORAL
  Filled 2016-04-30: qty 10

## 2016-04-30 MED ORDER — ALUMINUM HYDROXIDE GEL 320 MG/5ML PO SUSP
10.0000 mL | ORAL | Status: DC | PRN
  Administered 2016-05-04: 10 mL via ORAL
  Filled 2016-04-30 (×3): qty 30

## 2016-04-30 MED ORDER — WARFARIN SODIUM 4 MG PO TABS
4.0000 mg | ORAL_TABLET | Freq: Once | ORAL | Status: DC
  Filled 2016-04-30: qty 1

## 2016-04-30 MED ORDER — FAMOTIDINE 20 MG PO TABS: 20.0000 mg | ORAL_TABLET | Freq: Two times a day (BID) | ORAL | Status: DC | PRN

## 2016-04-30 MED ORDER — CALCITRIOL 0.5 MCG PO CAPS
0.5000 ug | ORAL_CAPSULE | ORAL | Status: DC
  Administered 2016-05-02 – 2016-05-19 (×7): 0.5 ug via ORAL
  Filled 2016-04-30 (×9): qty 1

## 2016-04-30 MED ORDER — WARFARIN SODIUM 4 MG PO TABS
4.0000 mg | ORAL_TABLET | Freq: Once | ORAL | Status: AC
  Administered 2016-04-30: 4 mg via ORAL
  Filled 2016-04-30 (×2): qty 1

## 2016-04-30 MED ORDER — TRAZODONE HCL 50 MG PO TABS
25.0000 mg | ORAL_TABLET | Freq: Every evening | ORAL | Status: DC | PRN
Start: 2016-04-30 — End: 2016-05-20
  Administered 2016-04-30 – 2016-05-19 (×9): 50 mg via ORAL
  Filled 2016-04-30 (×9): qty 1

## 2016-04-30 MED ORDER — LEVOTHYROXINE SODIUM 75 MCG PO TABS
175.0000 ug | ORAL_TABLET | Freq: Every day | ORAL | Status: DC
  Administered 2016-05-01 – 2016-05-20 (×20): 175 ug via ORAL
  Filled 2016-04-30 (×20): qty 1

## 2016-04-30 MED ORDER — TRAMADOL HCL 50 MG PO TABS
50.0000 mg | ORAL_TABLET | Freq: Four times a day (QID) | ORAL | Status: DC | PRN
  Administered 2016-04-30 – 2016-05-03 (×6): 50 mg via ORAL
  Filled 2016-04-30 (×9): qty 1

## 2016-04-30 MED ORDER — ALTEPLASE 2 MG IJ SOLR: 2.0000 mg | Freq: Once | INTRAMUSCULAR | Status: DC | PRN

## 2016-04-30 MED ORDER — GUAIFENESIN-DM 100-10 MG/5ML PO SYRP: 15.0000 mL | ORAL_SOLUTION | ORAL | Status: DC | PRN

## 2016-04-30 MED ORDER — LIDOCAINE-PRILOCAINE 2.5-2.5 % EX CREA
1.0000 "application " | TOPICAL_CREAM | CUTANEOUS | Status: DC | PRN
  Filled 2016-04-30: qty 5

## 2016-04-30 MED ORDER — BISACODYL 10 MG RE SUPP
10.0000 mg | Freq: Every day | RECTAL | Status: DC | PRN
  Filled 2016-04-30: qty 1

## 2016-04-30 MED ORDER — RENA-VITE PO TABS
1.0000 | ORAL_TABLET | Freq: Every day | ORAL | Status: DC
  Administered 2016-04-30 – 2016-05-19 (×19): 1 via ORAL
  Filled 2016-04-30 (×20): qty 1

## 2016-04-30 MED ORDER — DARBEPOETIN ALFA 100 MCG/0.5ML IJ SOSY: 100.0000 ug | PREFILLED_SYRINGE | INTRAMUSCULAR | Status: DC

## 2016-04-30 MED ORDER — PROCHLORPERAZINE MALEATE 5 MG PO TABS: 5.0000 mg | ORAL_TABLET | Freq: Four times a day (QID) | ORAL | Status: DC | PRN

## 2016-04-30 MED ORDER — ONDANSETRON HCL 4 MG PO TABS
4.0000 mg | ORAL_TABLET | Freq: Four times a day (QID) | ORAL | Status: DC | PRN
  Administered 2016-05-05 – 2016-05-09 (×4): 4 mg via ORAL
  Filled 2016-04-30 (×4): qty 1

## 2016-04-30 MED ORDER — SODIUM CHLORIDE 0.9 % IV SOLN: 100.0000 mL | INTRAVENOUS | Status: DC | PRN

## 2016-04-30 MED ORDER — GABAPENTIN 100 MG PO CAPS
100.0000 mg | ORAL_CAPSULE | ORAL | Status: DC
  Administered 2016-05-02 – 2016-05-05 (×2): 100 mg via ORAL
  Filled 2016-04-30 (×2): qty 1

## 2016-04-30 MED ORDER — FLEET ENEMA 7-19 GM/118ML RE ENEM: 1.0000 | ENEMA | Freq: Once | RECTAL | Status: DC | PRN

## 2016-04-30 MED ORDER — POLYETHYLENE GLYCOL 3350 17 G PO PACK
17.0000 g | PACK | Freq: Every day | ORAL | Status: DC
  Administered 2016-05-01 – 2016-05-11 (×7): 17 g via ORAL
  Filled 2016-04-30 (×12): qty 1

## 2016-04-30 MED ORDER — GUAIFENESIN-DM 100-10 MG/5ML PO SYRP: 5.0000 mL | ORAL_SOLUTION | Freq: Four times a day (QID) | ORAL | Status: DC | PRN

## 2016-04-30 MED ORDER — INSULIN ASPART 100 UNIT/ML ~~LOC~~ SOLN
0.0000 [IU] | Freq: Three times a day (TID) | SUBCUTANEOUS | Status: DC
  Administered 2016-05-01 – 2016-05-08 (×9): 1 [IU] via SUBCUTANEOUS
  Administered 2016-05-09: 2 [IU] via SUBCUTANEOUS
  Administered 2016-05-09: 3 [IU] via SUBCUTANEOUS
  Administered 2016-05-11: 1 [IU] via SUBCUTANEOUS
  Administered 2016-05-12 – 2016-05-13 (×2): 3 [IU] via SUBCUTANEOUS
  Administered 2016-05-14: 2 [IU] via SUBCUTANEOUS
  Administered 2016-05-15: 1 [IU] via SUBCUTANEOUS
  Administered 2016-05-15: 3 [IU] via SUBCUTANEOUS
  Administered 2016-05-16: 2 [IU] via SUBCUTANEOUS
  Administered 2016-05-17 (×2): 1 [IU] via SUBCUTANEOUS
  Administered 2016-05-18 – 2016-05-19 (×2): 2 [IU] via SUBCUTANEOUS

## 2016-04-30 MED ORDER — OXYCODONE HCL 5 MG PO TABS
ORAL_TABLET | ORAL | Status: AC
  Administered 2016-04-30: 5 mg via ORAL
  Filled 2016-04-30: qty 1

## 2016-04-30 MED ORDER — TACROLIMUS 1 MG PO CAPS
2.0000 mg | ORAL_CAPSULE | Freq: Two times a day (BID) | ORAL | Status: DC
  Administered 2016-04-30 – 2016-05-20 (×40): 2 mg via ORAL
  Filled 2016-04-30 (×44): qty 2

## 2016-04-30 MED ORDER — CALCIUM ACETATE (PHOS BINDER) 667 MG PO CAPS
667.0000 mg | ORAL_CAPSULE | Freq: Three times a day (TID) | ORAL | Status: DC
  Administered 2016-04-30 – 2016-05-04 (×11): 667 mg via ORAL
  Filled 2016-04-30 (×11): qty 1

## 2016-04-30 MED ORDER — PROCHLORPERAZINE EDISYLATE 5 MG/ML IJ SOLN: 5.0000 mg | Freq: Four times a day (QID) | INTRAMUSCULAR | Status: DC | PRN

## 2016-04-30 MED ORDER — PHENOL 1.4 % MT LIQD: 1.0000 | OROMUCOSAL | Status: DC | PRN

## 2016-04-30 MED ORDER — WARFARIN - PHARMACIST DOSING INPATIENT
Freq: Every day | Status: DC
  Administered 2016-04-30 – 2016-05-04 (×3)

## 2016-04-30 MED ORDER — LIDOCAINE HCL (PF) 1 % IJ SOLN
5.0000 mL | INTRAMUSCULAR | Status: DC | PRN
  Filled 2016-04-30: qty 5

## 2016-04-30 MED ORDER — INSULIN ASPART 100 UNIT/ML ~~LOC~~ SOLN
0.0000 [IU] | Freq: Every day | SUBCUTANEOUS | Status: DC
  Administered 2016-05-18: 2 [IU] via SUBCUTANEOUS

## 2016-04-30 MED ORDER — METOPROLOL TARTRATE 25 MG PO TABS
25.0000 mg | ORAL_TABLET | Freq: Two times a day (BID) | ORAL | Status: DC
  Administered 2016-04-30 – 2016-05-20 (×36): 25 mg via ORAL
  Filled 2016-04-30 (×40): qty 1

## 2016-04-30 MED ORDER — PANTOPRAZOLE SODIUM 40 MG PO TBEC
40.0000 mg | DELAYED_RELEASE_TABLET | Freq: Every day | ORAL | Status: DC
  Administered 2016-05-01 – 2016-05-20 (×20): 40 mg via ORAL
  Filled 2016-04-30 (×21): qty 1

## 2016-04-30 MED ORDER — POLYETHYLENE GLYCOL 3350 17 G PO PACK: 17.0000 g | PACK | Freq: Every day | ORAL | Status: DC | PRN

## 2016-04-30 MED ORDER — FENTANYL CITRATE (PF) 100 MCG/2ML IJ SOLN
INTRAMUSCULAR | Status: AC
  Administered 2016-04-30: 50 ug via INTRAVENOUS
  Filled 2016-04-30: qty 2

## 2016-04-30 NOTE — PMR Pre-admission (Signed)
PMR Admission Coordinator Pre-Admission Assessment  Patient: [name] is an 61 y.o., female MRN: [id number] DOB: Nov 17, 1955 Height: 5' 4.5" (163.8 cm) (from 04/17/16) Weight: 55.7 kg (122 lb 12.7 oz)              Insurance Information HMO: No   PPO:       PCP:       IPA:       80/20:       OTHER:   PRIMARY:  Medicare A/B      Policy#: [id number]      Subscriber: [name] CM Name:        Phone#:       Fax#:   Pre-Cert#:        Employer:  Disabled Benefits:  Phone #:       Name: Checked in [place]. Date:  04/11/11     Deduct: $1340      Out of Pocket Max: none      Life Max: unlimited CIR: 100%      SNF: 100 days Outpatient: 80%     Co-Pay: 20% Home Health: 100%      Co-Pay: none DME: 80%     Co-Pay: 20% Providers: patient's choice  SECONDARY:  BCBS supplement      Policy#: [id number]      Subscriber:  [name] CM Name:        Phone#:       Fax#:   Pre-Cert#:        Employer:  Disabled Benefits:  Phone #:  [phone]     Name:   Eff. Date:       Deduct:        Out of Pocket Max:        Life Max:   CIR:        SNF:   Outpatient:       Co-Pay:   Home Health:        Co-Pay:   DME:       Co-Pay:    Medicaid Application Date:        Case Manager:   Disability Application Date:        Case Worker:    Emergency Contact Information Contact Information    Name Relation Home Work Mobile   [place] Daughter   [phone]   Noralee Space Daughter [phone]      Current Medical History  Patient Admitting Diagnosis:  L BKA  History of Present Illness: A 60 y.o.femalewith history of ESRD--HD MWF, Hep C s/p liver transplant, CAD s/p CABG with AVR, chronic pain, PAD with chronic heel ulcer and ischemic toes. She has had progressive pain and was in process of being set for BKA. She was admitted on 04/21/16 with excruciating pain and inability to stand. Chronic coumadin held with heparin bridge till INR normalized. She underwent lexiscan and  was cleared for surgery by cardiology. She underwent L-BKA by Dr. Scot Dock on 04/24/16.  Continues on IV heparin as INR subtherapeutic. Noted to have drop in H/H to 8.0/23.8 and to be transfused with 2 units PRBC. She continues to be limited by lethargy, intermittent confusion and pain affecting ADL tasks and mobility. CIR recommended for follow up therapy.    Past Medical History  Past Medical History:  Diagnosis Date  . Anemia    takes Folic Acid daily  . Anxiety   . Aortic stenosis   . Arthritis    "left hand, back" (08/30/2012)  . Asthma   .  CAD (coronary artery disease)   . CAD (coronary artery disease) Jan. 2015   Cath: 20% LAD, 50% D1; s/p LIMA-LAD  . Chest pain 03/06/2015  . Chronic back pain   . Chronic bronchitis ([place])    "q yr w/season changes" (08/30/2012)  . Chronic constipation    takes MIralax and Colace daily  . COPD (chronic obstructive pulmonary disease) ([place])   . Depression    takes Cymbalta for "severe" depression  . End stage renal disease on dialysis [facility]) 02/27/2011   Kidneys shut down at time of liver transplant in Sept 2011 at [facility] in [place], she has been on HD ever since.  Dialyzes at [facility] HD on TTS schedule.  Had L forearm graft used 10 months then removed Dec 2012 due to suspected infection.  A right upper arm AVG was placed Dec 2012 but she developed steal symptoms acutely and it was ligated the same day.  Never had an AV fistula due to small veins.  Now has L thigh AVG put in Jan 2013, has not clotted to date.    [name] GERD (gastroesophageal reflux disease)    takes Omeprazole daily  . Headache    "at least monthly" (08/30/2012)  . Hepatitis C   . History of blood transfusion    "several" (08/30/2012)  . Hypertension    takes Metoprolol and Lisinopril daily, sees Dr Bea Graff  . Hypothyroidism    takes Synthroid daily  . Migraine    "last migraine was in 2013" (08/30/2012)  . Neuromuscular disorder ([place])    carpal tunnel in right hand  . Obesity   .  Peripheral vascular disease (HCC) hands and legs  . Pneumonia    "today and several times before" (08/30/2012)  . 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 SVT (supraventricular tachycardia) ([place]) 06/09/14  . Tobacco abuse   . Type II diabetes mellitus (HCC)    Levemir 2units daily if > 150   Family History  family history includes Cancer in her father and mother; Diabetes in her mother; Hypertension in her mother; Stroke in her mother.  Prior Rehab/Hospitalizations: Had [facility] PT/OT recently  Has the patient had major surgery during 100 days prior to admission? No  Current Medications   Current Facility-Administered Medications:  .  0.9 %  sodium chloride infusion, , Intravenous, Once, Reyne Dumas, MD .  alum & mag hydroxide-simeth (MAALOX/MYLANTA) 200-200-20 MG/5ML suspension 15-30 mL, 15-30 mL, Oral, Q2H PRN, Alvia Grove, PA-C .  aspirin EC tablet 81 mg, 81 mg, Oral, Daily, Edwin Dada, MD, 81 mg at 04/29/16 1026 .  calcitRIOL (ROCALTROL) capsule 0.5 mcg, 0.5 mcg, Oral, Q M,W,F-HD, Thomos Lemons Ejigiri, PA-C, 0.5 mcg at 04/28/16 0946 .  calcium acetate (PHOSLO) capsule 667 mg, 667 mg, Oral, TID WC, Edwin Dada, MD, 667 mg at 04/29/16 1724 .  Darbepoetin Alfa (ARANESP) injection 100 mcg, 100 mcg, Intravenous, Q Mon-HD, Alric Seton, PA-C, 100 mcg at 04/28/16 0943 .  famotidine (PEPCID) tablet 20 mg, 20 mg, Oral, BID PRN, Gardiner Barefoot, NP, 20 mg at 04/23/16 2105 .  feeding supplement (BOOST / RESOURCE BREEZE) liquid 1 Container, 1 Container, Oral, TID BM, Jamal Maes, MD, 1 Container at 04/29/16 2009 .  feeding supplement (PRO-STAT SUGAR FREE 64) liquid 30 mL, 30 mL, Oral, BID,  Thomos Lemons Ejigiri, PA-C, 30 mL at 04/29/16 2213 .  fentaNYL (SUBLIMAZE) injection 25-50 mcg, 25-50 mcg, Intravenous, Q5 min PRN, Roderic Palau, MD, 50 mcg at 04/30/16 0009 .  gabapentin (NEURONTIN) capsule 100 mg, 100 mg, Oral, Q M,W,F-HD, Edwin Dada, MD, 100 mg at 04/28/16 1237 .  gabapentin (NEURONTIN) capsule 300 mg, 300 mg, Oral, QHS, Edwin Dada, MD, 300 mg at 04/29/16 2216 .  guaiFENesin-dextromethorphan (ROBITUSSIN DM) 100-10 MG/5ML syrup 15 mL, 15 mL, Oral, Q4H PRN, Janalyn Harder Trinh, PA-C .  hydrALAZINE (APRESOLINE) injection 10-20 mg, 10-20 mg, Intravenous, Q2H PRN, Patrecia Pour, MD, 10 mg at 04/23/16 0426 .  insulin aspart (novoLOG) injection 0-5 Units, 0-5 Units, Subcutaneous, QHS, Christopher P Danford, MD .  insulin aspart (novoLOG) injection 0-9 Units, 0-9 Units, Subcutaneous, TID WC, Edwin Dada, MD, 1 Units at 04/29/16 1725 .  ketorolac (TORADOL) 15 MG/ML injection 15 mg, 15 mg, Intravenous, Q6H PRN, Reyne Dumas, MD, 15 mg at 04/30/16 0837 .  labetalol (NORMODYNE,TRANDATE) injection 10 mg, 10 mg, Intravenous, Q10 min PRN, Alvia Grove, PA-C .  levothyroxine (SYNTHROID, LEVOTHROID) tablet 175 mcg, 175 mcg, Oral, QAC breakfast, Edwin Dada, MD, 175 mcg at 04/29/16 [phone]  magnesium sulfate IVPB 2 g 50 mL, 2 g, Intravenous, Daily PRN, Alvia Grove, PA-C .  metoprolol (LOPRESSOR) injection 2-5 mg, 2-5 mg, Intravenous, Q2H PRN, Alvia Grove, PA-C .  metoprolol tartrate (LOPRESSOR) tablet 25 mg, 25 mg, Oral, BID, Edwin Dada, MD, 25 mg at 04/29/16 2217 .  multivitamin (RENA-VIT) tablet 1 tablet, 1 tablet, Oral, QHS, [name], PA-C, 1 tablet at 04/29/16 2217 .  ondansetron (ZOFRAN) tablet 4 mg, 4 mg, Oral, Q6H PRN **OR** ondansetron (ZOFRAN) injection 4 mg, 4 mg, Intravenous, Q6H PRN, Edwin Dada, MD, 4 mg at 04/24/16 1738 .  oxyCODONE (Oxy IR/ROXICODONE) immediate release tablet 5 mg, 5 mg, Oral, Q4H PRN, Edwin Dada, MD, 5 mg at 04/30/16 0940 .  pantoprazole (PROTONIX) EC tablet 40 mg, 40 mg, Oral, Daily, Kimberly A Trinh,  PA-C, 40 mg at 04/29/16 1029 .  phenol (CHLORASEPTIC) mouth spray 1 spray, 1 spray, Mouth/Throat, PRN, Janalyn Harder Trinh, PA-C .  polyethylene glycol (MIRALAX / GLYCOLAX) packet 17 g, 17 g, Oral, Daily PRN, Gardiner Barefoot, NP, 17 g at 04/23/16 1215 .  senna-docusate (Senokot-S) tablet 2 tablet, 2 tablet, Oral, QHS, Gardiner Barefoot, NP, 2 tablet at 04/29/16 2215 .  senna-docusate (Senokot-S) tablet 2 tablet, 2 tablet, Oral, QHS PRN, Gardiner Barefoot, NP .  tacrolimus (PROGRAF) capsule 2 mg, 2 mg, Oral, BID, Edwin Dada, MD, 2 mg at 04/29/16 2217 .  traMADol (ULTRAM) tablet 50 mg, 50 mg, Oral, Q6H PRN, Reyne Dumas, MD, 50 mg at 04/28/16 1554 .  warfarin (COUMADIN) tablet 4 mg, 4 mg, Oral, ONCE-1800, Reyne Dumas, MD .  Warfarin - Pharmacist Dosing Inpatient, , Does not apply, q1800, Delane Ginger, RPH  Patients Current Diet: Diet regular Room service appropriate? Yes; Fluid consistency: Thin; Fluid restriction: 1200 mL Fluid Diet - low sodium heart healthy  Precautions / Restrictions Precautions Precautions: Fall Restrictions Weight Bearing Restrictions: Yes LLE Weight Bearing: Non weight bearing   Has the patient had 2 or more falls or a fall with injury in the past year?Yes.  Reports at least 5 falls.  Prior Activity Level Community (5-7x/wk): Went out 3-4 X a week to HD and to church.  Home Assistive Devices / Equipment  Home Assistive Devices/Equipment: Environmental consultant (specify type), Eyeglasses, Dentures (specify type) Home Equipment: Bedside commode, Walker - 2 wheels  Prior Device Use: Indicate devices/aids used by the patient prior to current illness, exacerbation or injury? Manual wheelchair, Walker and Seated walker.  Did not use any device PTA.  Prior Functional Level Prior Function Level of Independence: Independent Comments: Pt reports that her daughter has been helpign her more lately due to LE pain  Self Care: Did the patient need help bathing,  dressing, using the toilet or eating?  Needed some help  Indoor Mobility: Did the patient need assistance with walking from room to room (with or without device)? Needed some help  Stairs: Did the patient need assistance with internal or external stairs (with or without device)? Dependent  Functional Cognition: Did the patient need help planning regular tasks such as shopping or remembering to take medications? Independent  Current Functional Level Cognition  Overall Cognitive Status: Within Functional Limits for tasks assessed Orientation Level: Oriented X4 Following Commands: Follows one step commands inconsistently, Follows one step commands with increased time    Extremity Assessment (includes Sensation/Coordination)  Upper Extremity Assessment: Overall WFL for tasks assessed  Lower Extremity Assessment: Overall WFL for tasks assessed RLE Deficits / Details: WFL, with mild generalized weakness LLE Deficits / Details: increased pain, aarom to full flexion and -2 to -3 * extension    ADLs  Overall ADL's : Needs assistance/impaired Eating/Feeding: Supervision/ safety, Set up, Sitting Eating/Feeding Details (indicate cue type and reason): Maintained good posture at EOB to eat Grooming: Set up, Sitting Upper Body Bathing: Minimal assistance, Sitting Lower Body Bathing: Minimal assistance, Sit to/from stand Upper Body Dressing : Minimal assistance, Sitting Lower Body Dressing: Minimal assistance, Sit to/from stand Lower Body Dressing Details (indicate cue type and reason): Pt donned R sock with supervision General ADL Comments: Pt unable to partciaption in grooming task due to lethargic behavior and decreased orientation    Mobility  Overal bed mobility: Needs Assistance Bed Mobility: Rolling, Sidelying to Sit Rolling: Min guard Sidelying to sit: Min guard (use of the rail) Supine to sit: Mod assist Sit to supine: Min assist General bed mobility comments: use of rail to roll  and help push up, no assist needed    Transfers  Overall transfer level: Needs assistance Equipment used: Rolling walker (2 wheeled) Transfers: Sit to/from Stand Sit to Stand: Mod assist, +2 safety/equipment General transfer comment: Cues for hand placement and assist to come both forward and up.    Ambulation / Gait / Stairs / Wheelchair Mobility  Ambulation/Gait Ambulation/Gait assistance: Museum/gallery curator (Feet): 5 Feet (and additional 7 feet) Assistive device: Rolling walker (2 wheeled) Gait Pattern/deviations: Step-to pattern General Gait Details: worked of technique for standing, safe position in the RW.  Swing to pattern.  Pt can not keep her UE's locked at the elbow for long and therefore does not have a confident swing to.    Posture / Balance Balance Overall balance assessment: Needs assistance Sitting-balance support: Bilateral upper extremity supported, Feet supported Sitting balance-Leahy Scale: Fair Standing balance support: Bilateral upper extremity supported, During functional activity Standing balance-Leahy Scale: Poor Standing balance comment: pt still having trouble maintaining balance without external support    Special needs/care consideration BiPAP/CPAP No.  Daughter says that patient needs to re-do her steep study. CPM No Continuous Drip IV No Dialysis Yes        Days M-W-F Life Vest No Oxygen No Special Bed No Trach Size No Wound  Vac (area) No      Skin Has new L BKA incision and a sore spot on right foot.  Gets bedsores easily per daughter      Bowel mgmt: Last BM per daughter was 04/29/16 Bladder mgmt: Anuraia Diabetic mgmt Yes, using very little insulin    Previous Home Environment Living Arrangements: Children Available Help at Discharge: Family, Available PRN/intermittently (daughter works during which pt in alone.) Type of Home: Mobile home Home Layout: One level Home Access: Stairs to enter Entrance Stairs-Rails: Right, Left,  Can reach both Entrance Stairs-Number of Steps: 5 Bathroom Shower/Tub: Chiropodist: Handicapped height Bathroom Accessibility: No Home Care Services: No Additional Comments: pt thinks a w/c will be difficult to maneuver in her mobilie home.  Discharge Living Setting Plans for Discharge Living Setting: Lives with (comment), Mobile Home (Lives with daughter, [name].) Type of Home at Discharge: Mobile home (Single wide, extended length mobile home.) Discharge Home Layout: One level Discharge Home Access: Stairs to enter, Ramped entrance Entrance Stairs-Number of Steps: 4 steps, but having ramp built. Does the patient have any problems obtaining your medications?: No  Social/Family/Support Systems Patient Roles: Parent (Has 2 daughters.) Contact Information: Jayme Cloud - daughter.  Note that [name] has medical power of attorney and should be first contact for all decisions. Anticipated Caregiver: Daughter Anticipated Caregiver's Contact Information: [name] - dtr [phone] Ability/Limitations of Caregiver: Dtr goes to school 830 to 130, but plans to have neighbor stay with patient when she is gone.  Another daughter is taking FMLA for 6 weeks.  [name] says  Healther is not much help. Caregiver Availability: Other (Comment) (Daughter aware of need for supervision and assist at home.) Discharge Plan Discussed with Primary Caregiver: Yes Is Caregiver In Agreement with Plan?: Yes Does Caregiver/Family have Issues with Lodging/Transportation while Pt is in Rehab?: No  Goals/Additional Needs Patient/Family Goal for Rehab: PT/OT supervision to min assist goals Expected length of stay: 11-15 days Cultural Considerations: Attends [employer]. Dietary Needs: Regular diet, thin liquids Equipment Needs: TBD Special Service Needs: Goes to HD M-W-F Additional Information: Daughter is concerned and feels patient needs to be seen by  psychiatrist/psychologist.  Patient is ? depressed, ?having adjustment issues, ? has phantom limb pain and needs to talk to someone.  Consider referring to neurospychologist. Pt/Family Agrees to Admission and willing to participate: Yes (I spoke to both daughters by phone.) Program Orientation Provided & Reviewed with Pt/Caregiver Including Roles  & Responsibilities: Yes  Decrease burden of Care through IP rehab admission:  N/A  Possible need for SNF placement upon discharge: Not anticipated  Patient Condition: This patient's medical and functional status has changed since the consult dated: 04/25/16 in which the Rehabilitation Physician determined and documented that the patient's condition is appropriate for intensive rehabilitative care in an inpatient rehabilitation facility. See "History of Present Illness" (above) for medical update. Functional changes are:  Currently requiring mod assist for transfers and min assist to ambulate 5 feet RW. Patient's medical and functional status update has been discussed with the Rehabilitation physician and patient remains appropriate for inpatient rehabilitation. Will admit to inpatient rehab today.  Preadmission Screen Completed By:  Retta Diones, 04/30/2016 11:07 AM ______________________________________________________________________   Discussed status with Dr. Posey Pronto on 04/30/16 at 54 and received telephone approval for admission today.  Admission Coordinator:  Retta Diones, time 1102/Date 04/30/16

## 2016-04-30 NOTE — Progress Notes (Addendum)
ANTICOAGULATION CONSULT NOTE - Follow Up Consult  Pharmacy Consult for Heparin bridge to Warfarin Indication: AVR  Patient Measurements: Height: 5' 4.5" (163.8 cm) (from 04/17/16) Weight: 119 lb 7.8 oz (54.2 kg) IBW/kg (Calculated) : 55.85  Vital Signs: Temp: 98.9 F (37.2 C) (03/21 0522) Temp Source: Oral (03/21 0522) BP: 133/52 (03/21 0522) Pulse Rate: 70 (03/21 0522)  Assessment: 61 yo F on warfarin 2mg  daily PTA for h/o mechanical aortic valve. Goal INR 2.5-3.5. Patient came to ED 3/12 with severe L foot pain. 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 Patient was changed to heparin on admission, restarted warfarin 3/18 and plan to continue heparin while INR < 2.5 per [facility].   INR today is therapeutic at 2.53, as well as heparin level at 0.44. Hgb low stable at 8.0 (2 units of pRBCs ordered today), Plt wnl, no bleeding noted.  Goal of Therapy:  Heparin level 0.3-0.7 units/ml  INR 2.5-3.5 Monitor platelets by anticoagulation protocol: Yes   Plan:  D/c heparin gtt Warfarin 4mg  PO x1 again tonight Daily INR Monitor CBC, s/sx of bleeding   Gwenlyn Perking, PharmD PGY1 Pharmacy Resident Pager: [phone] 04/30/2016 10:18 AM

## 2016-04-30 NOTE — Progress Notes (Signed)
[name] Progress Note   Subjective: no c/o  Vitals:   04/30/16 1030 04/30/16 1100 04/30/16 1130 04/30/16 1200  BP: (!) 147/71 (!) 159/60 (!) 146/72 108/73  Pulse: 73 72 72 63  Resp:      Temp: 98 F (36.7 C) 98 F (36.7 C)    TempSrc:      SpO2:      Weight:      Height:        Inpatient medications: . sodium chloride   Intravenous Once  . aspirin EC  81 mg Oral Daily  . calcitRIOL  0.5 mcg Oral Q M,W,F-HD  . calcium acetate  667 mg Oral TID WC  . darbepoetin (ARANESP) injection - DIALYSIS  100 mcg Intravenous Q Mon-HD  . feeding supplement  1 Container Oral TID BM  . feeding supplement (PRO-STAT SUGAR FREE 64)  30 mL Oral BID  . gabapentin  100 mg Oral Q M,W,F-HD  . gabapentin  300 mg Oral QHS  . insulin aspart  0-5 Units Subcutaneous QHS  . insulin aspart  0-9 Units Subcutaneous TID WC  . levothyroxine  175 mcg Oral QAC breakfast  . metoprolol tartrate  25 mg Oral BID  . multivitamin  1 tablet Oral QHS  . pantoprazole  40 mg Oral Daily  . senna-docusate  2 tablet Oral QHS  . tacrolimus  2 mg Oral BID  . warfarin  4 mg Oral ONCE-1800  . Warfarin - Pharmacist Dosing Inpatient   Does not apply q1800    alum & mag hydroxide-simeth, famotidine, fentaNYL (SUBLIMAZE) injection, guaiFENesin-dextromethorphan, hydrALAZINE, ketorolac, labetalol, magnesium sulfate 1 - 4 g bolus IVPB, metoprolol, ondansetron **OR** ondansetron (ZOFRAN) IV, oxyCODONE, phenol, polyethylene glycol, senna-docusate, traMADol  Exam: Alert, no distress, calm No jvd Chest clear bilat RRR +valve click Abd soft ntnd L BKA wrapped, no RLE edema R IJ TDC in place NF, ox 3  Dialysis: [name] MWF 3h 29min   2/2 bath  58kg   Hep none   R IJ cath - calcitriol 0.5 ug tiw - no ESA, last Mircera was on 2/22 - labs > last Hb 11.8, tsat 32%, pth 573, P 5      Assessment: 1. L BKA 3/15 - POD #6. Being seen by CIR.  2. ESRD MWF HD 3. Anemia of CKD - got esa 100 ug on 3/19 4. CKD-MBD -  cont meds 5. Volume - no vol excess, will need lower dry wt at dc due to amputation 6. Hx AVR on coumadin - INR up 2.5 today 7. Hx liver Tx - on prograf only  8. DM on insulin 9. DNR  Plan - HD today   [name] MD [name] pager [phone]   04/30/2016, 12:33 PM    Recent Labs Lab 04/23/16 1442  04/27/16 0541 04/28/16 0532 04/30/16 0709  NA 138  < > 132* 132* 132*  K 3.1*  < > 3.8 3.9 4.1  CL 97*  < > 97* 96* 99*  CO2 31  < > 24 24 21*  GLUCOSE 112*  < > 144* 159* 120*  BUN 15  < > 25* 38* 26*  CREATININE 6.21*  < > 5.13* 6.60* 5.35*  CALCIUM 8.2*  < > 8.3* 8.0* 8.2*  PHOS 5.0*  --   --   --   --   < > = values in this interval not displayed.  Recent Labs Lab 04/23/16 1442 04/24/16 0104 04/25/16 0332  AST  --  68* 55*  ALT  --  17 16  ALKPHOS  --  253* 214*  BILITOT  --  1.6* 0.8  PROT  --  6.1* 5.3*  ALBUMIN 1.8* 1.8* 1.5*    Recent Labs Lab 04/28/16 0532 04/29/16 0527 04/30/16 0709  WBC 15.4* 15.3* 13.9*  HGB 8.7* 8.3* 8.0*  HCT 25.7* 24.8* 23.8*  MCV 84.3 84.4 87.2  PLT 142* 193 184   Iron/TIBC/Ferritin/ %Sat    Component Value Date/Time   IRON 22 (L) 03/12/2016 1308   TIBC 181 (L) 03/12/2016 1308   FERRITIN 1,095 (H) 08/30/2012 1449   IRONPCTSAT 12 03/12/2016 1308

## 2016-04-30 NOTE — Progress Notes (Signed)
Physical Therapy Treatment Patient Details Name: [name] MRN: [id number] DOB: May 09, 1955 Today's Date: 04/30/2016    History of Present Illness  [name] is a 61 y.o. 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    PT Comments    Progressing well with gait stability and sequencing with RW.  Emphasized amputee exercises and positioning   Follow Up Recommendations  CIR     Equipment Recommendations  3in1 (PT);Wheelchair (measurements PT);Wheelchair cushion (measurements PT)    Recommendations for Other Services Rehab consult     Precautions / Restrictions Precautions Precautions: Fall Restrictions Weight Bearing Restrictions: No    Mobility  Bed Mobility Overal bed mobility: Needs Assistance Bed Mobility: Rolling;Sidelying to Sit Rolling: Min guard Sidelying to sit: Min guard       General bed mobility comments: moderate use of the rail  Transfers Overall transfer level: Needs assistance Equipment used: Rolling walker (2 wheeled) Transfers: Sit to/from Stand Sit to Stand: Min assist         General transfer comment: practiced sit to stand with correct hand placement and technique x3.  Pt still needing assist to come forward enough over her BOS  Ambulation/Gait Ambulation/Gait assistance: Min assist Ambulation Distance (Feet): 15 Feet Assistive device: Rolling walker (2 wheeled) Gait Pattern/deviations: Step-to pattern   Gait velocity interpretation: Below normal speed for age/gender General Gait Details: pt showed safe technique post demo of sit to stand and sequencing in the RW   Stairs            Wheelchair Mobility    Modified Rankin (Stroke Patients Only)       Balance Overall balance assessment: Needs assistance Sitting-balance  support: Feet supported Sitting balance-Leahy Scale: Good     Standing balance support: Bilateral upper extremity supported;During functional activity Standing balance-Leahy Scale: Poor Standing balance comment: Pt able to partcipation in groom at sink using counter top and RW for stability                    Cognition Arousal/Alertness: Awake/alert Behavior During Therapy: WFL for tasks assessed/performed Overall Cognitive Status: Within Functional Limits for tasks assessed                      Exercises Amputee Exercises Hip Extension: AROM;Strengthening;Left;10 reps;Supine Hip Flexion/Marching: AROM;Both;10 reps;Seated Knee Flexion: AROM;AAROM;5 reps;Supine (to 110*) Knee Extension: AROM;Left;5 reps;Seated (to 0*)    General Comments        Pertinent Vitals/Pain Pain Assessment: Faces Faces Pain Scale: Hurts little more Pain Location: L LE Pain Descriptors / Indicators: Discomfort;Constant Pain Intervention(s): Monitored during session;Patient requesting pain meds-RN notified    Home Living                      Prior Function            PT Goals (current goals can now be found in the care plan section) Acute Rehab PT Goals Patient Stated Goal: Get stronger and go home PT Goal Formulation: With patient Time For Goal Achievement: 05/09/16 Potential to Achieve Goals: Good Progress towards PT goals: Progressing toward goals    Frequency    Min 3X/week      PT Plan Current plan remains appropriate    Co-evaluation  End of Session   Activity Tolerance: Patient tolerated treatment well Patient left: in chair;with call bell/phone within reach;with chair alarm set Nurse Communication: Mobility status PT Visit Diagnosis: Muscle weakness (generalized) (M62.81);Other abnormalities of gait and mobility (R26.89) Pain - Right/Left: Left Pain - part of body: Leg     Time: 5784-6962 PT Time Calculation (min) (ACUTE  ONLY): 19 min  Charges:  $Gait Training: 8-22 mins                    G CodesTessie Fass Topaz Raglin 04/30/2016, 4:45 PM  04/30/2016  [name], [place] [phone]  (pager)

## 2016-04-30 NOTE — Progress Notes (Signed)
Patient arrived to unit by bed.  Reviewed treatment plan and this RN agrees with plan.  Report received from bedside RN, Maudie Mercury.  Consent verified.  Patient A & O X 4.   Lung sounds diminished to ausculation in all fields. No edema. Cardiac:  NSR.  Removed caps and cleansed RIJ catheter with chlorhedxidine.  Aspirated ports of heparin and flushed them with saline per protocol.  Connected and secured lines, initiated treatment at 0940.  UF Goal of 2559mL and net fluid removal 1 L.  Will continue to monitor.

## 2016-04-30 NOTE — Progress Notes (Signed)
Rehab admissions - I spoke with Dr. Allyson Sabal and patient will be able to admit to inpatient rehab today.  Bed available on inpatient rehab today.  I spoke with both daughters and they are aware of admit to inpatient rehab today.  Call me for questions.  #[phone]

## 2016-04-30 NOTE — Discharge Summary (Signed)
Physician Discharge Summary  [name] MRN: [id number] DOB/AGE: 03/07/1955 61 y.o.  PCP: No primary care provider on file.   Admit date: 04/21/2016 Discharge date: 04/30/2016  Discharge Diagnoses:    Principal Problem:   Claudication of left lower extremity ([place]) Active Problems:   Hypothyroidism   Type 2 diabetes mellitus with chronic kidney disease ([place])   Anemia due to chronic renal failure    End stage renal disease on dialysis ([place])   CAD (coronary artery disease), native coronary artery   History of prosthetic aortic valve replacement   Chronic combined systolic and diastolic congestive heart failure ([facility])   COPD (chronic obstructive pulmonary disease) ([facility])   Chronic anticoagulation w/ coumadin, goal INR 2.5-3.0 w/ AVR   S/P liver transplant ([place])   Foot pain   Critical lower limb ischemia    Follow-up recommendations Follow-up with PCP in 3-5 days , including all  additional recommended appointments as below Follow-up CBC, CMP in 3-5 days Patient may need follow-up with Angelia Mould, MD, please contact vascular surgery to confirm appointment Follow PT/INR every day, maintain INR between 2.5-3.5 per cardiology Minimize narcotic medications to avoid daytime confusion       Current Discharge Medication List    START taking these medications   Details  Amino Acids-Protein Hydrolys (FEEDING SUPPLEMENT, PRO-STAT SUGAR FREE 64,) LIQD Take 30 mLs by mouth 2 (two) times daily. Qty: 900 mL, Refills: 0    calcitRIOL (ROCALTROL) 0.5 MCG capsule Take 1 capsule (0.5 mcg total) by mouth every Monday, Wednesday, and Friday with hemodialysis. Qty: 30 capsule, Refills: 1    feeding supplement (BOOST / RESOURCE BREEZE) LIQD Take 1 Container by mouth 3 (three) times daily between meals. Qty: 1 Container, Refills: 100    guaiFENesin-dextromethorphan (ROBITUSSIN DM) 100-10 MG/5ML syrup Take 15 mLs by mouth every 4 (four) hours as needed for cough. Qty: 118 mL,  Refills: 0    oxyCODONE (OXY IR/ROXICODONE) 5 MG immediate release tablet Take 1 tablet (5 mg total) by mouth every 4 (four) hours as needed for moderate pain or breakthrough pain. Qty: 10 tablet, Refills: 0    pantoprazole (PROTONIX) 40 MG tablet Take 1 tablet (40 mg total) by mouth daily. Qty: 30 tablet, Refills: 1    polyethylene glycol (MIRALAX / GLYCOLAX) packet Take 17 g by mouth daily as needed for moderate constipation. Qty: 14 each, Refills: 0    traMADol (ULTRAM) 50 MG tablet Take 1 tablet (50 mg total) by mouth every 6 (six) hours as needed for moderate pain. Qty: 30 tablet, Refills: 0      CONTINUE these medications which have NOT CHANGED   Details  aspirin EC 81 MG tablet Take 81 mg by mouth daily.    !! gabapentin (NEURONTIN) 300 MG capsule Take 300 mg by mouth every evening.    HYDROmorphone (DILAUDID) 2 MG tablet Take 2 mg by mouth.    levothyroxine (SYNTHROID, LEVOTHROID) 175 MCG tablet TAKE 1 TABLET (175 MCG TOTAL) BY MOUTH DAILY BEFORE BREAKFAST. Refills: 6    metoprolol tartrate (LOPRESSOR) 25 MG tablet Take 25 mg by mouth 2 (two) times daily.    nitroGLYCERIN (NITROSTAT) 0.4 MG SL tablet Place 1 tablet (0.4 mg total) under the tongue every 5 (five) minutes as needed for chest pain. Qty: 30 tablet, Refills: 0    ondansetron (ZOFRAN ODT) 4 MG disintegrating tablet Take 1 tablet (4 mg total) by mouth every 8 (eight) hours as needed for nausea. Qty: 6 tablet, Refills: 0  senna-docusate (SENOKOT-S) 8.6-50 MG tablet Take 1 tablet by mouth daily.    tacrolimus (PROGRAF) 1 MG capsule Take 2 capsules (2 mg total) by mouth 2 (two) times daily. Take 2 capsules twice daily. Qty: 120 capsule, Refills: 0    calcium acetate (PHOSLO) 667 MG capsule Take 1 capsule (667 mg total) by mouth 3 (three) times daily with meals. Qty: 30 capsule, Refills: 1    !! gabapentin (NEURONTIN) 100 MG capsule Take 100 mg by mouth as directed. 1 capsule on dialysis days    warfarin  (COUMADIN) 2 MG tablet Take 1- 2 tablets by mouth daily or as directed by coumadin clinic Qty: 45 tablet, Refills: 3     !! - Potential duplicate medications found. Please discuss with provider.    STOP taking these medications     Insulin Detemir (LEVEMIR FLEXPEN) 100 UNIT/ML Pen             Discharge Instructions    Diet - low sodium heart healthy    Complete by:  As directed    Increase activity slowly    Complete by:  As directed        Allergies  Allergen Reactions  . Acetaminophen Other (See Comments)    Liver transplant recipient   . Morphine Itching and Other (See Comments)    hallucinate  . Codeine Itching  . Mirtazapine Other (See Comments)    hallucination      Disposition: CIR   Consults: * Nephrology Vascular surgery Cardiology    Significant Diagnostic Studies:  Nm Myocar Multi W/spect W/wall Motion / Ef  Result Date: 04/22/2016 CLINICAL DATA:  Preop foot fracture. EXAM: MYOCARDIAL IMAGING WITH SPECT (REST AND PHARMACOLOGIC-STRESS) GATED LEFT VENTRICULAR WALL MOTION STUDY LEFT VENTRICULAR EJECTION FRACTION TECHNIQUE: Standard myocardial SPECT imaging was performed after resting intravenous injection of 10 mCi Tc-60mtetrofosmin. Subsequently, intravenous infusion of Lexiscan was performed under the supervision of the Cardiology staff. At peak effect of the drug, 30 mCi Tc-925metrofosmin was injected intravenously and standard myocardial SPECT imaging was performed. Quantitative gated imaging was also performed to evaluate left ventricular wall motion, and estimate left ventricular ejection fraction. COMPARISON:  None. FINDINGS: Perfusion: There is a fixed defects noted in the lateral wall extending into the anterior and inferior walls compatible with old infarct. No significant reversibility to suggest ischemia. Wall Motion: Mild generalized hypokinesia S, slightly more pronounced in the area of scar Left Ventricular Ejection Fraction: 46 % End  diastolic volume 12237l End systolic volume 79 ml IMPRESSION: 1. 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 No ischemia. 2. Generalized hypokinesia, slightly more pronounced in the area of scar. 3. Left ventricular ejection fraction 46% 4. Non invasive risk stratification*: In hernia *2012 Appropriate Use Criteria for Coronary Revascularization Focused Update: J Am Coll Cardiol. 206283;15(1):[id number]://[email or web address]?articleid=[email or web address] Electronically Signed   By: KeRolm Baptise.D.   On: 04/22/2016 15:54          Filed Weights   04/29/16 0319 04/29/16 2150 04/30/16 0930  Weight: 54.3 kg (119 lb 11.2 oz) 54.2 kg (119 lb 7.8 oz) 55.7 kg (122 lb 12.7 oz)     Microbiology: Recent Results (from the past 240 hour(s))  Surgical pcr screen     Status: None   Collection Time: 04/23/16 10:40 PM  Result Value Ref Range Status   MRSA, PCR NEGATIVE NEGATIVE Final   Staphylococcus aureus NEGATIVE NEGATIVE Final    Comment:  The Xpert SA Assay (FDA approved for NASAL specimens in patients over 76 years of age), is one component of a comprehensive surveillance program.  Test performance has been validated by [facility] for patients greater than or equal to 72 year old. It is not intended to diagnose infection nor to guide or monitor treatment.        Blood Culture    Component Value Date/Time   SDES BLOOD RIGHT HAND 03/18/2016 1655   SPECREQUEST BOTTLES DRAWN AEROBIC ONLY 5CC 03/18/2016 1655   CULT NO GROWTH 5 DAYS 03/18/2016 1655   REPTSTATUS 03/23/2016 FINAL 03/18/2016 1655      Labs: Results for orders placed or performed during the hospital encounter of 04/21/16 (from the past 48 hour(s))  Glucose, capillary     Status: Abnormal   Collection Time: 04/28/16 12:16 PM  Result Value Ref Range   Glucose-Capillary 112 (H) 65 - 99 mg/dL  Glucose, capillary     Status: Abnormal   Collection Time: 04/28/16  4:25 PM   Result Value Ref Range   Glucose-Capillary 115 (H) 65 - 99 mg/dL   Comment 1 Notify RN   Glucose, capillary     Status: Abnormal   Collection Time: 04/28/16  9:29 PM  Result Value Ref Range   Glucose-Capillary 114 (H) 65 - 99 mg/dL  CBC     Status: Abnormal   Collection Time: 04/29/16  5:27 AM  Result Value Ref Range   WBC 15.3 (H) 4.0 - 10.5 K/uL   RBC 2.94 (L) 3.87 - 5.11 MIL/uL   Hemoglobin 8.3 (L) 12.0 - 15.0 g/dL   HCT 24.8 (L) 36.0 - 46.0 %   MCV 84.4 78.0 - 100.0 fL   MCH 28.2 26.0 - 34.0 pg   MCHC 33.5 30.0 - 36.0 g/dL   RDW 17.0 (H) 11.5 - 15.5 %   Platelets 193 150 - 400 K/uL    Comment: REPEATED TO VERIFY PLATELET COUNT CONFIRMED BY SMEAR   Heparin level (unfractionated)     Status: None   Collection Time: 04/29/16  5:27 AM  Result Value Ref Range   Heparin Unfractionated 0.34 0.30 - 0.70 IU/mL    Comment:        IF HEPARIN RESULTS ARE BELOW EXPECTED VALUES, AND PATIENT DOSAGE HAS BEEN CONFIRMED, SUGGEST FOLLOW UP TESTING OF ANTITHROMBIN III LEVELS.   Protime-INR     Status: Abnormal   Collection Time: 04/29/16  5:27 AM  Result Value Ref Range   Prothrombin Time 22.1 (H) 11.4 - 15.2 seconds   INR 1.90   Glucose, capillary     Status: Abnormal   Collection Time: 04/29/16  7:37 AM  Result Value Ref Range   Glucose-Capillary 106 (H) 65 - 99 mg/dL  Type and screen [place]     Status: None (Preliminary result)   Collection Time: 04/29/16 10:50 AM  Result Value Ref Range   ABO/RH(D) A POS    Antibody Screen NEG    Sample Expiration 05/02/2016    Unit Number [id number]    Blood Component Type RED CELLS,LR    Unit division 00    Status of Unit ISSUED    Transfusion Status OK TO TRANSFUSE    Crossmatch Result Compatible    Unit Number [id number]    Blood Component Type RED CELLS,LR    Unit division 00    Status of Unit ISSUED    Transfusion Status OK TO TRANSFUSE    Crossmatch Result Compatible  Glucose, capillary     Status:  Abnormal   Collection Time: 04/29/16 12:11 PM  Result Value Ref Range   Glucose-Capillary 169 (H) 65 - 99 mg/dL  Glucose, capillary     Status: Abnormal   Collection Time: 04/29/16  4:49 PM  Result Value Ref Range   Glucose-Capillary 146 (H) 65 - 99 mg/dL  Glucose, capillary     Status: Abnormal   Collection Time: 04/29/16  9:47 PM  Result Value Ref Range   Glucose-Capillary 124 (H) 65 - 99 mg/dL  Basic metabolic panel     Status: Abnormal   Collection Time: 04/30/16  7:09 AM  Result Value Ref Range   Sodium 132 (L) 135 - 145 mmol/L   Potassium 4.1 3.5 - 5.1 mmol/L    Comment: SPECIMEN HEMOLYZED. HEMOLYSIS MAY AFFECT INTEGRITY OF RESULTS.   Chloride 99 (L) 101 - 111 mmol/L   CO2 21 (L) 22 - 32 mmol/L   Glucose, Bld 120 (H) 65 - 99 mg/dL   BUN 26 (H) 6 - 20 mg/dL   Creatinine, Ser 5.35 (H) 0.44 - 1.00 mg/dL   Calcium 8.2 (L) 8.9 - 10.3 mg/dL   GFR calc non Af Amer 8 (L) >60 mL/min   GFR calc Af Amer 9 (L) >60 mL/min    Comment: (NOTE) The eGFR has been calculated using the CKD EPI equation. This calculation has not been validated in all clinical situations. eGFR's persistently <60 mL/min signify possible Chronic Kidney Disease.    Anion gap 12 5 - 15  CBC     Status: Abnormal   Collection Time: 04/30/16  7:09 AM  Result Value Ref Range   WBC 13.9 (H) 4.0 - 10.5 K/uL   RBC 2.73 (L) 3.87 - 5.11 MIL/uL   Hemoglobin 8.0 (L) 12.0 - 15.0 g/dL   HCT 23.8 (L) 36.0 - 46.0 %   MCV 87.2 78.0 - 100.0 fL   MCH 29.3 26.0 - 34.0 pg   MCHC 33.6 30.0 - 36.0 g/dL   RDW 17.9 (H) 11.5 - 15.5 %   Platelets 184 150 - 400 K/uL  Glucose, capillary     Status: Abnormal   Collection Time: 04/30/16  8:22 AM  Result Value Ref Range   Glucose-Capillary 122 (H) 65 - 99 mg/dL  Prepare RBC     Status: None   Collection Time: 04/30/16  8:23 AM  Result Value Ref Range   Order Confirmation ORDER PROCESSED BY BLOOD BANK   Heparin level (unfractionated)     Status: None   Collection Time: 04/30/16   8:43 AM  Result Value Ref Range   Heparin Unfractionated 0.44 0.30 - 0.70 IU/mL    Comment:        IF HEPARIN RESULTS ARE BELOW EXPECTED VALUES, AND PATIENT DOSAGE HAS BEEN CONFIRMED, SUGGEST FOLLOW UP TESTING OF ANTITHROMBIN III LEVELS.   Protime-INR     Status: Abnormal   Collection Time: 04/30/16  8:43 AM  Result Value Ref Range   Prothrombin Time 27.8 (H) 11.4 - 15.2 seconds   INR 2.53      Lipid Panel     Component Value Date/Time   CHOL 225 (H) 12/22/2011 1030   TRIG 121 12/22/2011 1030   HDL 97 12/22/2011 1030   CHOLHDL 2.3 12/22/2011 1030   VLDL 24 12/22/2011 1030   LDLCALC 104 (H) 12/22/2011 1030     Lab Results  Component Value Date   HGBA1C 7.5 (H) 03/05/2016   HGBA1C 5.0 10/10/2015  HGBA1C 6.7 (H) 03/30/2014       HPI :  Dejanira [place] [facility] a 61 y.o.femalewith a past medical history significant for ESRD on HD MWF, s/p Liver transplant for hep C followed [facility], [place] s/p AVR and CABG in 2015 on warfarinwho presented with acute worsening of chronic foot pain due to PVD. Her vascular surgeon, Dr. Scot Dock, had planned BKA 3/20,  she became unable to control the pain, and presented 3/12. Pain described as constant, excruciating and not relieved by oral narcotics at home. On presentation, she was afebrile, heart rate 93, respirations 21, BP 210/88, SpO2 96%. INR 3.0, WBC 8.7, hgb 13.8. The patient was admitted for pain control, coumadin held and started on heparin. A stress myoview was ordered for surgical risk stratification. Vascular surgery  consulted and now she is status post inpatient BKA  .Left below the knee amputation performed 3/15  HOSPITAL COURSE:   Acute on chronic peripheral vascular disease with dry gangrene and intractable ischemic pain: status post BKA  Continue tramadol, Toradol , oxycodone , discontinued Dilaudid, OxyContin due to oversedation  Zofran prn nausea VVS performed BKA 3/15,   transfuse 2 units of packed blood cells. preop  clearance per cardiology obtained prior to surgery-NM stress test  ,Myoview perf was low risk with anterolateral scar and no ischemia. Cleared by cardiology     ESRD on HD JSE:GBTD Dr. Justin Mend as outpatient. - HD per nephrology, - Continue phoslo  Liver transplant:Followed at [facility] hepatology.  - Continue prograf.    transplant hepatologist on-call who reviewed the records, recommends continue tacrolimus at this dose.   Aortic stenosis s/p mechanical AVR:On warfarin. Per Dr. Evette Georges note, will need bridging.  Cardiology recommended  heparin gtt when INR less than 2.5,   since she has a [employer] mechanical valve, needs bridging once her Coumadin is held to reduce the likelihood of prosthetic valve thrombosis.  Status post vitamin K 2 mg IV on 3/14, INR subtherapeutic for several days now 1.78>1.8>1.72>1.9>2.53 Monitor INR on a daily basis Keep INR between 2.5-3.5   CAD s/p CABG: Status post Lexiscan Myoview.   - Myoview  was low risk with anterolateral scar and no ischemia. Deemed low risk to have her BKA  - Continue aspirin, metoprolol  Anemia of chronic disease Hemoglobin has drifted down from 12.2> 9.6>9.3>8.7>8.3>8.0 Transfused  2 units with hemodialysis tomorrow 3/21 Platelet count is stable    Hypertension: Blood pressure stable - Continue metoprolol - HD per nephrology - Hydralazine prn ordered  Hypothyroidism:   Continue levothyroxine Last TSH 4.8, 03/06/16   NSVT  EF 40-45%, will continue coreg Keep potassium greater than 4 and magnesium greater than 2       Discharge Exam:  Blood pressure (!) 133/57, pulse 72, temperature 97.9 F (36.6 C), resp. rate (!) 22, height 5' 4.5" (1.638 m), weight 55.7 kg (122 lb 12.7 oz), SpO2 100 %.  General exam: 61 y.o. female in evident pain Respiratory system: Non-labored breathing ambient air. Clear to auscultation bilaterally.  Cardiovascular system: Regular rate and rhythm. No murmur, rub, or gallop.  No JVD, and no pedal edema. 2+ radial pulses. Absent DP pulses. Left thigh graft +thrill.  Gastrointestinal system: Abdomen soft, non-tender, non-distended, with normoactive bowel sounds. No organomegaly or masses felt. Central nervous system: Alert and oriented. No focal neurological deficits. Extremities: s/p: left BKA Psychiatry: Judgement and insight appear normal. Mood & affect appropriate    Follow-up Information    [name], MD Follow up in  4 week(s).   Specialties:  Vascular Surgery, Cardiology Why:  Our office will call you to arrange an appointment  Contact information: [address] [place] [address] [phone]           Signed: Reyne Dumas 04/30/2016, 10:22 AM        Time spent >45 mins

## 2016-04-30 NOTE — Progress Notes (Signed)
Occupational Therapy Treatment Patient Details Name: [name] MRN: [id number] DOB: 04/12/1955 Today's Date: 04/30/2016    History of present illness  [name] is a 61 y.o. 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   OT comments  Pt demonstrated increase activity tolerance throughout session as seen by performing grooming tasks at sink with Min A. Pt required Min A for balance at sink and used RW or counter top for support during bilateral coordination tasks; required one rest break after initial lose of balance but continued task with good balance. Provided pt education on LLE positioning to decreased risk of contractures. Continue to recommend dc to CIR for further intense OT to increase independence in ADLs and functional mobility.     Follow Up Recommendations  CIR    Equipment Recommendations  Other (comment) (Defer to next venue)    Recommendations for Other Services      Precautions / Restrictions Precautions Precautions: Fall Restrictions Weight Bearing Restrictions: No       Mobility Bed Mobility               General bed mobility comments: In recliner upon arrival  Transfers Overall transfer level: Needs assistance Equipment used: Rolling walker (2 wheeled) Transfers: Sit to/from Stand Sit to Stand: Min assist;+2 safety/equipment              Balance Overall balance assessment: Needs assistance Sitting-balance support: Feet supported Sitting balance-Leahy Scale: Fair     Standing balance support: Bilateral upper extremity supported;During functional activity Standing balance-Leahy Scale: Fair Standing balance comment: Pt able to partcipation in groom at sink using counter top and RW for stability                   ADL Overall ADL's :  Needs assistance/impaired     Grooming: Oral care;Wash/dry face;Minimal assistance;Standing Grooming Details (indicate cue type and reason): Pt performed groomign at sink with Min A for standing balance; pt required one sitting break to recliner after lose balance during bilateral coordiantion task but recovered well                               General ADL Comments: Pt demosntrates increased activity tolerance and good motiviation to partcipate in ADLs       Vision                     Perception     Praxis      Cognition   Behavior During Therapy: [facility] for tasks assessed/performed Overall Cognitive Status: Within Functional Limits for tasks assessed                         Exercises     Shoulder Instructions       General Comments      Pertinent Vitals/ Pain       Pain Assessment: Faces Faces Pain Scale: Hurts a little bit Pain Location: L LE Pain Descriptors / Indicators: Discomfort;Constant Pain Intervention(s): Monitored during session  Home Living  Prior Functioning/Environment              Frequency  Min 2X/week        Progress Toward Goals  OT Goals(current goals can now be found in the care plan section)  Progress towards OT goals: Progressing toward goals  Acute Rehab OT Goals Patient Stated Goal: Get stronger and go home OT Goal Formulation: With patient Time For Goal Achievement: 05/09/16 Potential to Achieve Goals: Good ADL Goals Pt Will Perform Grooming: with min guard assist;standing Pt Will Perform Lower Body Dressing: with min guard assist;sit to/from stand Pt Will Transfer to Toilet: with min guard assist;stand pivot transfer;bedside commode  Plan Discharge plan remains appropriate    Co-evaluation                 End of Session Equipment Utilized During Treatment: Gait belt;Rolling walker  OT Visit Diagnosis: Unsteadiness on feet  (R26.81);Pain Pain - Right/Left: Left Pain - part of body: Leg   Activity Tolerance Patient tolerated treatment well   Patient Left in chair;with call bell/phone within reach;with nursing/sitter in room   Nurse Communication Mobility status        Time: 7124-5809 OT Time Calculation (min): 21 min  Charges: OT General Charges $OT Visit: 1 Procedure OT Treatments $Self Care/Home Management : 8-22 mins  Kinder, OTR/L [place] 04/30/2016, 4:02 PM

## 2016-04-30 NOTE — Progress Notes (Signed)
Late entry . Patient received at 1630 via recliner. Patient noted laying on side. Patient alert and oriented but in pain. Patient's daughter in room. Oriented patient and daughter to room and call bell system. Reviewed in depth safety protocol and safety plan. Patient and daughter verbalized understanding of admission process. Continue with plan of care.  [name]

## 2016-04-30 NOTE — H&P (Signed)
Physical Medicine and Rehabilitation Admission H&P    CC: L-BKA due to ischemia   HPI:   [name] is a 61 y.o. female with history of ESRD--HD MWF, Hep C s/p liver transplant, CAD s/p CABG with AVR, chronic pain, PAD with chronic heel ulcer and ischemic toes. She has had progressive pain  and was in process of being set for BKA. She was admitted on 04/21/16 with excruciating pain and inability to stand. Chronic coumadin held with heparin bridge till INR normalized. She underwent lexiscan and was cleared for surgery by cardiology. She underwent L-BKA by Dr. Scot Dock on 04/24/16.  Continues on IV heparin as INR subtherapeutic. Noted to have drop in H/H to 8.0/23.8 and to be transfused with 2 units PRBC. She continues to be limited by lethargy, intermittent confusion and pain affecting ADL tasks and mobility. CIR recommended for follow up therapy.    Review of Systems  Constitutional: Negative for fever.  HENT: Negative for hearing loss and tinnitus.   Eyes: Negative for blurred vision and double vision.  Respiratory: Negative for cough and shortness of breath.   Cardiovascular: Negative for chest pain and palpitations.  Gastrointestinal: Positive for constipation. Negative for heartburn and nausea.  Musculoskeletal: Negative for joint pain and myalgias.  Skin: Negative for itching and rash.  Neurological: Positive for sensory change (right foot pain better--great toe numbness.  Phantom pain LLE. ) and weakness. Negative for dizziness and headaches.  Psychiatric/Behavioral: Negative for memory loss. The patient is nervous/anxious.   All other systems reviewed and are negative.     Past Medical History:  Diagnosis Date  . Anemia    takes Folic Acid daily  . Anxiety   . Aortic stenosis   . Arthritis    "left hand, back" (08/30/2012)  . Asthma   . CAD (coronary artery disease)   . CAD (coronary artery disease) Jan. 2015   Cath: 20% LAD, 50% D1; s/p LIMA-LAD  . Chest pain  03/06/2015  . Chronic back pain   . Chronic bronchitis ([place])    "q yr w/season changes" (08/30/2012)  . Chronic constipation    takes MIralax and Colace daily  . COPD (chronic obstructive pulmonary disease) ([place])   . Depression    takes Cymbalta for "severe" depression  . End stage renal disease on dialysis [facility]) 02/27/2011   Kidneys shut down at time of liver transplant in Sept 2011 at [facility] in [place], she has been on HD ever since.  Dialyzes at [facility] HD on TTS schedule.  Had L forearm graft used 10 months then removed Dec 2012 due to suspected infection.  A right upper arm AVG was placed Dec 2012 but she developed steal symptoms acutely and it was ligated the same day.  Never had an AV fistula due to small veins.  Now has L thigh AVG put in Jan 2013, has not clotted to date.    [name] GERD (gastroesophageal reflux disease)    takes Omeprazole daily  . Headache    "at least monthly" (08/30/2012)  . Hepatitis C   . History of blood transfusion    "several" (08/30/2012)  . Hypertension    takes Metoprolol and Lisinopril daily, sees Dr Bea Graff  . Hypothyroidism    takes Synthroid daily  . Migraine    "last migraine was in 2013" (08/30/2012)  . Neuromuscular disorder ([place])    carpal tunnel in right hand  . Obesity   . Peripheral vascular disease ([place]) hands and  legs  . Pneumonia    "today and several times before" (08/30/2012)  . 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 SVT (supraventricular tachycardia) ([place]) 06/09/14  . Tobacco abuse   . Type II diabetes mellitus ([facility])    Levemir 2units daily if > 150    Past Surgical History:  Procedure Laterality Date  . AMPUTATION Left 04/24/2016   Procedure: AMPUTATION BELOW KNEE;  Surgeon: Angelia Mould, MD;  Location: [place];  Service: Vascular;  Laterality: Left;  . AORTIC VALVE REPLACEMENT N/A 03/15/2013    AVR; Surgeon: Ivin Poot, MD;  Location: [facility] OR; Open Heart Surgery;  37m[employer] mechanical prosthesis, top hat valve  . ARTERIOVENOUS GRAFT PLACEMENT Left 10/03/10    forearm  . AV FISTULA PLACEMENT  01/29/2011   Procedure: INSERTION OF ARTERIOVENOUS (AV) GORE-TEX GRAFT ARM;  Surgeon: CElam Dutch MD;  [facility]  Service: Vascular;  Laterality: Right;  . AV FISTULA PLACEMENT  03/10/2011   Procedure: INSERTION OF ARTERIOVENOUS (AV) GORE-TEX GRAFT THIGH;  Surgeon: CElam Dutch MD;  [place]  Service: Vascular;  Laterality: Left;  . AJeffersonREMOVAL  12/23/2010   Procedure: REMOVAL OF [facility]EX GRAFT [facility]  Surgeon: CElam Dutch MD;  [place]  Service: Vascular;  Laterality: Left;  procedure started @1736 -1852  . CHOLECYSTECTOMY  1993  . CORONARY ARTERY BYPASS GRAFT N/A 03/15/2013   Procedure: CORONARY ARTERY BYPASS GRAFTING (CABG) times one using left internal mammary artery.;  Surgeon: PIvin Poot MD;  [place]  Service: Open Heart Surgery;  Laterality: N/A;  POSS CABG X 1  . CYSTOSCOPY  1990's  . INSERTION OF DIALYSIS CATHETER  12/23/2010   Procedure: INSERTION OF DIALYSIS CATHETER;  Surgeon: CElam Dutch MD;  [place]  Service: Vascular;  Laterality: Right;  Right Internal Jugular 28cm dialysis catheter insertion procedure time 1701-1720   . INSERTION OF DIALYSIS CATHETER Right 03/11/2016   Procedure: INSERTION OF DIALYSIS CATHETER;  Surgeon: CAngelia Mould MD;  [place]  Service: Vascular;  Laterality: Right;  . INTRAOPERATIVE TRANSESOPHAGEAL ECHOCARDIOGRAM N/A 03/15/2013   Procedure: INTRAOPERATIVE TRANSESOPHAGEAL ECHOCARDIOGRAM;  Surgeon: PIvin Poot MD;  [place]  Service: Open Heart Surgery;  Laterality: N/A;  . LEFT HEART CATHETERIZATION WITH CORONARY ANGIOGRAM N/A 07/29/2012   Procedure: LEFT HEART CATHETERIZATION WITH CORONARY ANGIOGRAM;  Surgeon: TTroy Sine MD;  [facility];  Service: Cardiovascular;  Laterality: N/A;  . LEFT HEART CATHETERIZATION WITH CORONARY ANGIOGRAM N/A 03/10/2013   Procedure: LEFT HEART CATHETERIZATION WITH CORONARY ANGIOGRAM;  Surgeon: TTroy Sine MD;  [facility];  Service: Cardiovascular;  Laterality: N/A;  . LEFT HEART CATHETERIZATION WITH CORONARY/GRAFT ANGIOGRAM N/A 12/24/2011   Procedure: LEFT HEART CATHETERIZATION WITH CBeatrix Fetters  Surgeon: JLorretta Harp MD;  [facility];  Service: Cardiovascular;  Laterality: N/A;  . LEFT HEART CATHETERIZATION WITH CORONARY/GRAFT ANGIOGRAM N/A 12/16/2013   Procedure: LEFT HEART CATHETERIZATION WITH CBeatrix Fetters  Surgeon: TTroy Sine MD;  [facility];  Service: Cardiovascular;  Laterality: N/A;  . LIGATION ARTERIOVENOUS GORTEX GRAFT Left 03/11/2016   Procedure: LIGATION THIGH ARTERIOVENOUS GORTEX GRAFT;  Surgeon: CAngelia Mould MD;  [place]  Service: Vascular;  Laterality: Left;  . LIVER TRANSPLANT  10/25/2009   sees Dr Ferol Luz 1 every 6 months, saw last in Dec 2013. [name] Coord [phone]  . PERIPHERAL VASCULAR CATHETERIZATION N/A 11/06/2015   Procedure: Abdominal Aortogram;  Surgeon: Serafina Mitchell, MD;  Location[place] CV LAB;  Service: Cardiovascular;  Laterality: N/A;  . PERIPHERAL VASCULAR CATHETERIZATION N/A 11/06/2015   Procedure: Lower Extremity Angiography;  Surgeon: Serafina Mitchell, MD;  Location: [place] CV LAB;  Service: Cardiovascular;  Laterality: N/A;  . PERIPHERAL VASCULAR CATHETERIZATION  11/06/2015   Procedure: Peripheral Vascular Atherectomy;  Surgeon: Serafina Mitchell, MD;  Location: [place] CV LAB;  Service: Cardiovascular;;  Left Superficial femoral  . SHUNTOGRAM Left 05/15/2014   Procedure: SHUNTOGRAM;  Surgeon: Conrad Rosedale, MD;  [facility];  Service: Cardiovascular;  Laterality: Left;  . SMALL INTESTINE SURGERY  90's  . SPINAL GROWTH RODS  2010   "put 2  metal rods in my back; they had detetriorated" (08/30/2012)  . THROMBECTOMY    . THROMBECTOMY AND REVISION OF ARTERIOVENTOUS (AV) GORETEX  GRAFT Left 03/30/2014  . THROMBECTOMY AND REVISION OF ARTERIOVENTOUS (AV) GORETEX  GRAFT Left 03/30/2014   Procedure: THROMBECTOMY AND REVISION OF ARTERIOVENTOUS (AV) GORETEX  GRAFT;  Surgeon: Conrad Burtrum, MD;  Location: [place];  Service: Vascular;  Laterality: Left;  . TUBAL LIGATION  1990's    Family History  Problem Relation Age of Onset  . Cancer Mother   . Diabetes Mother   . Hypertension Mother   . Stroke Mother   . Cancer Father   . Anesthesia problems Neg Hx   . Hypotension Neg Hx   . Malignant hyperthermia Neg Hx   . Pseudochol deficiency Neg Hx     Social History:  Lives with daughter who goes to school 2-3 times a week. Daughter can assist as needed. Per reports that she has been smoking Cigarettes--1/2 PPD.  She has a 20.00 pack-year smoking history. She has never used smokeless tobacco. Per  reports that she does not drink alcohol  or use drugs   Allergies  Allergen Reactions  . Acetaminophen Other (See Comments)    Liver transplant recipient   . Morphine Itching and Other (See Comments)    hallucinate  . Codeine Itching  . Mirtazapine Other (See Comments)    hallucination    Medications Prior to Admission  Medication Sig Dispense Refill  . aspirin EC 81 MG tablet Take 81 mg by mouth daily.    [name] gabapentin (NEURONTIN) 300 MG capsule Take 300 mg by mouth every evening.    [name] HYDROmorphone (DILAUDID) 2 MG tablet Take 2 mg by mouth.    . Insulin Detemir (LEVEMIR FLEXPEN) 100 UNIT/ML Pen Inject 2-6 Units into the skin See admin instructions. Only if cbg is over 200    . levothyroxine (SYNTHROID, LEVOTHROID) 175 MCG tablet TAKE 1 TABLET (175 MCG TOTAL) BY MOUTH DAILY BEFORE BREAKFAST.  6  . metoprolol tartrate (LOPRESSOR) 25 MG tablet Take 25 mg by mouth 2 (two) times daily.    . nitroGLYCERIN (NITROSTAT) 0.4 MG SL tablet Place 1  tablet (0.4 mg total) under the tongue every 5 (five) minutes as needed for chest pain. 30 tablet 0  . ondansetron (ZOFRAN ODT) 4 MG disintegrating tablet Take 1 tablet (4 mg total) by mouth every 8 (eight) hours as needed for nausea. 6 tablet 0  . senna-docusate (SENOKOT-S) 8.6-50 MG tablet Take 1 tablet by mouth daily.    . tacrolimus (PROGRAF) 1 MG capsule  Take 2 capsules (2 mg total) by mouth 2 (two) times daily. Take 2 capsules twice daily. 120 capsule 0  . calcium acetate (PHOSLO) 667 MG capsule Take 1 capsule (667 mg total) by mouth 3 (three) times daily with meals. (Patient not taking: Reported on 04/23/2016) 30 capsule 1  . gabapentin (NEURONTIN) 100 MG capsule Take 100 mg by mouth as directed. 1 capsule on dialysis days    . warfarin (COUMADIN) 2 MG tablet Take 1- 2 tablets by mouth daily or as directed by coumadin clinic (Patient taking differently: Take 2 mg by mouth daily at 6 PM. Take as directed by coumadin clinic) 45 tablet 3    Home: [place] expects to be discharged to:: Private residence Living Arrangements: Children Available Help at Discharge: Family, Available PRN/intermittently (daughter works during which pt in alone.) Type of Home: Mobile home Home Access: Stairs to enter Technical brewer of Steps: 5 Entrance Stairs-Rails: Right, Left, Can reach both [place]: One level Bathroom Shower/Tub: Chiropodist: Handicapped height Bathroom Accessibility: No Home Equipment: Bedside commode, [place] - 2 wheels Additional Comments: pt thinks a w/c will be difficult to maneuver in her mobilie home.   Functional History: Prior Function Level of Independence: Independent Comments: Pt reports that her daughter has been helpign her more lately due to LE pain  Functional Status:  Mobility: Bed Mobility Overal bed mobility: Needs Assistance Bed Mobility: Rolling, Sidelying to Sit Rolling: Min guard Sidelying to sit: Min guard (use  of the rail) Supine to sit: Mod assist Sit to supine: Min assist General bed mobility comments: use of rail to roll and help push up, no assist needed Transfers Overall transfer level: Needs assistance Equipment used: Rolling walker (2 wheeled) Transfers: Sit to/from Stand Sit to Stand: Mod assist, +2 safety/equipment General transfer comment: Cues for hand placement and assist to come both forward and up. Ambulation/Gait Ambulation/Gait assistance: Min assist Ambulation Distance (Feet): 5 Feet (and additional 7 feet) Assistive device: Rolling walker (2 wheeled) Gait Pattern/deviations: Step-to pattern General Gait Details: worked of technique for standing, safe position in the RW.  Swing to pattern.  Pt can not keep her UE's locked at the elbow for long and therefore does not have a confident swing to.    ADL: ADL Overall ADL's : Needs assistance/impaired Eating/Feeding: Supervision/ safety, Set up, Sitting Eating/Feeding Details (indicate cue type and reason): Maintained good posture at EOB to eat Grooming: Set up, Sitting Upper Body Bathing: Minimal assistance, Sitting Lower Body Bathing: Minimal assistance, Sit to/from stand Upper Body Dressing : Minimal assistance, Sitting Lower Body Dressing: Minimal assistance, Sit to/from stand Lower Body Dressing Details (indicate cue type and reason): Pt donned R sock with supervision General ADL Comments: Pt unable to partciaption in grooming task due to lethargic behavior and decreased orientation  Cognition: Cognition Overall Cognitive Status: Within Functional Limits for tasks assessed Orientation Level: Oriented X4 Cognition Arousal/Alertness: Awake/alert Behavior During Therapy: WFL for tasks assessed/performed Overall Cognitive Status: Within Functional Limits for tasks assessed Area of Impairment: Orientation, Following commands, Awareness Orientation Level: Disoriented to, Place, Time, Situation Following Commands: Follows  one step commands inconsistently, Follows one step commands with increased time Awareness: Intellectual   Blood pressure 134/82, pulse 77, temperature 98 F (36.7 C), resp. rate (!) 22, height 5' 4.5" (1.638 m), weight 54.7 kg (120 lb 9.5 oz), SpO2 100 %. Physical Exam  Nursing note and vitals reviewed. Constitutional: She is oriented to person, place, and time. She appears well-developed  and well-nourished.  HENT:  Head: Normocephalic and atraumatic.  Edentulous   Eyes: Conjunctivae are normal. Pupils are equal, round, and reactive to light.  Neck: Normal range of motion. Neck supple.  Cardiovascular: Normal rate and regular rhythm.   Respiratory: Effort normal and breath sounds normal. No stridor. She has no wheezes. She has no rales.  GI: Soft. Bowel sounds are normal. She exhibits no distension. There is no tenderness.  Musculoskeletal: She exhibits edema and tenderness.  Compressive dressing with KI on L-BKA. Right toes ischemic appearing with boggy heel.   Neurological: She is alert and oriented to person, place, and time.  Motor: B/l UE 4/5 RLE: HF 2/5, KE 3/5, ADF/PF 4/5 LLE: HF 4/5  Skin: Skin is warm and dry.  Dusky extremeties  Psychiatric: She has a normal mood and affect. Her behavior is normal.    Results for orders placed or performed during the hospital encounter of 04/21/16 (from the past 48 hour(s))  Glucose, capillary     Status: Abnormal   Collection Time: 04/28/16  4:25 PM  Result Value Ref Range   Glucose-Capillary 115 (H) 65 - 99 mg/dL   Comment 1 Notify RN   Glucose, capillary     Status: Abnormal   Collection Time: 04/28/16  9:29 PM  Result Value Ref Range   Glucose-Capillary 114 (H) 65 - 99 mg/dL  CBC     Status: Abnormal   Collection Time: 04/29/16  5:27 AM  Result Value Ref Range   WBC 15.3 (H) 4.0 - 10.5 K/uL   RBC 2.94 (L) 3.87 - 5.11 MIL/uL   Hemoglobin 8.3 (L) 12.0 - 15.0 g/dL   HCT 24.8 (L) 36.0 - 46.0 %   MCV 84.4 78.0 - 100.0 fL   MCH  28.2 26.0 - 34.0 pg   MCHC 33.5 30.0 - 36.0 g/dL   RDW 17.0 (H) 11.5 - 15.5 %   Platelets 193 150 - 400 K/uL    Comment: REPEATED TO VERIFY PLATELET COUNT CONFIRMED BY SMEAR   Heparin level (unfractionated)     Status: None   Collection Time: 04/29/16  5:27 AM  Result Value Ref Range   Heparin Unfractionated 0.34 0.30 - 0.70 IU/mL    Comment:        IF HEPARIN RESULTS ARE BELOW EXPECTED VALUES, AND PATIENT DOSAGE HAS BEEN CONFIRMED, SUGGEST FOLLOW UP TESTING OF ANTITHROMBIN III LEVELS.   Protime-INR     Status: Abnormal   Collection Time: 04/29/16  5:27 AM  Result Value Ref Range   Prothrombin Time 22.1 (H) 11.4 - 15.2 seconds   INR 1.90   Glucose, capillary     Status: Abnormal   Collection Time: 04/29/16  7:37 AM  Result Value Ref Range   Glucose-Capillary 106 (H) 65 - 99 mg/dL  Type and screen [place]     Status: None (Preliminary result)   Collection Time: 04/29/16 10:50 AM  Result Value Ref Range   ABO/RH(D) A POS    Antibody Screen NEG    Sample Expiration 05/02/2016    Unit Number [id number]    Blood Component Type RED CELLS,LR    Unit division 00    Status of Unit ISSUED    Transfusion Status OK TO TRANSFUSE    Crossmatch Result Compatible    Unit Number [id number]    Blood Component Type RED CELLS,LR    Unit division 00    Status of Unit ISSUED    Transfusion Status OK TO TRANSFUSE  Crossmatch Result Compatible   Glucose, capillary     Status: Abnormal   Collection Time: 04/29/16 12:11 PM  Result Value Ref Range   Glucose-Capillary 169 (H) 65 - 99 mg/dL  Glucose, capillary     Status: Abnormal   Collection Time: 04/29/16  4:49 PM  Result Value Ref Range   Glucose-Capillary 146 (H) 65 - 99 mg/dL  Glucose, capillary     Status: Abnormal   Collection Time: 04/29/16  9:47 PM  Result Value Ref Range   Glucose-Capillary 124 (H) 65 - 99 mg/dL  Basic metabolic panel     Status: Abnormal   Collection Time: 04/30/16  7:09 AM    Result Value Ref Range   Sodium 132 (L) 135 - 145 mmol/L   Potassium 4.1 3.5 - 5.1 mmol/L    Comment: SPECIMEN HEMOLYZED. HEMOLYSIS MAY AFFECT INTEGRITY OF RESULTS.   Chloride 99 (L) 101 - 111 mmol/L   CO2 21 (L) 22 - 32 mmol/L   Glucose, Bld 120 (H) 65 - 99 mg/dL   BUN 26 (H) 6 - 20 mg/dL   Creatinine, Ser 5.35 (H) 0.44 - 1.00 mg/dL   Calcium 8.2 (L) 8.9 - 10.3 mg/dL   GFR calc non Af Amer 8 (L) >60 mL/min   GFR calc Af Amer 9 (L) >60 mL/min    Comment: (NOTE) The eGFR has been calculated using the CKD EPI equation. This calculation has not been validated in all clinical situations. eGFR's persistently <60 mL/min signify possible Chronic Kidney Disease.    Anion gap 12 5 - 15  CBC     Status: Abnormal   Collection Time: 04/30/16  7:09 AM  Result Value Ref Range   WBC 13.9 (H) 4.0 - 10.5 K/uL   RBC 2.73 (L) 3.87 - 5.11 MIL/uL   Hemoglobin 8.0 (L) 12.0 - 15.0 g/dL   HCT 23.8 (L) 36.0 - 46.0 %   MCV 87.2 78.0 - 100.0 fL   MCH 29.3 26.0 - 34.0 pg   MCHC 33.6 30.0 - 36.0 g/dL   RDW 17.9 (H) 11.5 - 15.5 %   Platelets 184 150 - 400 K/uL  Glucose, capillary     Status: Abnormal   Collection Time: 04/30/16  8:22 AM  Result Value Ref Range   Glucose-Capillary 122 (H) 65 - 99 mg/dL  Prepare RBC     Status: None   Collection Time: 04/30/16  8:23 AM  Result Value Ref Range   Order Confirmation ORDER PROCESSED BY BLOOD BANK   Heparin level (unfractionated)     Status: None   Collection Time: 04/30/16  8:43 AM  Result Value Ref Range   Heparin Unfractionated 0.44 0.30 - 0.70 IU/mL    Comment:        IF HEPARIN RESULTS ARE BELOW EXPECTED VALUES, AND PATIENT DOSAGE HAS BEEN CONFIRMED, SUGGEST FOLLOW UP TESTING OF ANTITHROMBIN III LEVELS.   Protime-INR     Status: Abnormal   Collection Time: 04/30/16  8:43 AM  Result Value Ref Range   Prothrombin Time 27.8 (H) 11.4 - 15.2 seconds   INR 2.53    No results found.     Medical Problem List and Plan: 1.  Gait abnormality,  difficulty with transfers, limitation with self-care secondary to left BKA. 2.  DVT Prophylaxis/Anticoagulation: Pharmaceutical: Coumadin 3. Chronic back pain/Pain Management: On gabapentin for neuropathy. Used dilaudid at home.  Continue oxycodone prn for now.  4. Mood: Improving with decrease in pain. LCSW to follow for evaluation and  support.  5. Neuropsych: This patient is capable of making decisions on her own behalf. 6. Skin/Wound Care: Routine wound care. Monitor stump. 7. Fluids/Electrolytes/Nutrition: Strict I/O. Serial labs with HD to monitor lytes 8. ESRD: Monitor weight daily with strict I/O. Schedule HD late afternoons to help with tolerance of activity during the day.  9. CAD s/p CABG with AVR:  10. 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 T2DM: Monitor BS ac/hs. Po intake poor--used 2 units levemir daily prn BS > 150. Will use SSI for elevated BS until intake is consistent.  12. HTN: Monitor bid. Hypotension should improve with transfusion.  30. H/o Migraines: Managed with prn medications.  14. Anemia of chronic disease: Aranesp weekly with transfusion prn.  15. COPD: Respiratory status stable.  16. Leucocytosis: Resolving. Monitor for trend and temperature curve.    Post Admission Physician Evaluation: 1. Preadmission assessment reviewed and changes made below. 2. Functional deficits secondary  to left BKA. 3. Patient is admitted to receive collaborative, interdisciplinary care between the physiatrist, rehab nursing staff, and therapy team. 4. Patient's level of medical complexity and substantial therapy needs in context of that medical necessity cannot be provided at a lesser intensity of care such as a SNF. 5. Patient has experienced substantial functional loss from his/her baseline which was documented above under the "Functional History" and "Functional Status" headings.  Judging by the patient's diagnosis, physical exam, and functional history, the patient has  potential for functional progress which will result in measurable gains while on inpatient rehab.  These gains will be of substantial and practical use upon discharge  in facilitating mobility and self-care at the household level. 6. Physiatrist will provide 24 hour management of medical needs as well as oversight of the therapy plan/treatment and provide guidance as appropriate regarding the interaction of the two. 7. The Preadmission Screening has been reviewed and patient status is unchanged unless otherwise stated above. 8. 24 hour rehab nursing will assist with safety, skin/wound care, disease management, pain management and patient education  and help integrate therapy concepts, techniques,education, etc. 9. PT will assess and treat for/with: Lower extremity strength, range of motion, stamina, balance, functional mobility, safety, adaptive techniques and equipment, woundcare, coping skills, pain control, pre-prosthetic education.   Goals are: Mod I. 10. OT will assess and treat for/with: ADL's, functional mobility, safety, upper extremity strength, adaptive techniques and equipment, wound mgt, ego support, and community reintegration.   Goals are: Mod I. Therapy may not proceed with showering this patient. 11. Case Management and Social Worker will assess and treat for psychological issues and discharge planning. 12. Team conference will be held weekly to assess progress toward goals and to determine barriers to discharge. 13. Patient will receive at least 3 hours of therapy per day at least 5 days per week. 14. ELOS: 10-15 days.       15. Prognosis:  good  Delice Lesch, MD, [facility] Jamse Arn, MD 04/30/2016

## 2016-04-30 NOTE — Progress Notes (Signed)
Dialysis treatment completed.  2500 mL ultrafiltrated.  1000 mL net fluid removal.  Patient status unchanged. Lung sounds diminished to ausculation in all fields. No edema. Cardiac: NSR.  Cleansed RIJ catheter with chlorhexidine.  Disconnected lines and flushed ports with saline per protocol.  Ports locked with heparin and capped per protocol.    Report given to bedside, RN Maudie Mercury.  2 units PRBC administered intra treatment.

## 2016-04-30 NOTE — Progress Notes (Signed)
OT Cancellation    04/30/16 1100  OT Visit Information  Last OT Received On 04/30/16  Assistance Needed +2  Reason Eval/Treat Not Completed Patient at procedure or test/ unavailable (HD)   [place], OTR/L [phone]

## 2016-04-30 NOTE — Progress Notes (Signed)
Patient transferred to [id number]. Report given. All belongings with patient. Patient left unit in stable condition.  Sheliah Plane RN

## 2016-05-01 ENCOUNTER — Inpatient Hospital Stay (HOSPITAL_COMMUNITY): Payer: Medicare Other | Admitting: Physical Therapy

## 2016-05-01 ENCOUNTER — Inpatient Hospital Stay (HOSPITAL_COMMUNITY): Payer: Medicare Other | Admitting: Occupational Therapy

## 2016-05-01 DIAGNOSIS — Z794 Long term (current) use of insulin: Secondary | ICD-10-CM

## 2016-05-01 DIAGNOSIS — M545 Low back pain: Secondary | ICD-10-CM

## 2016-05-01 DIAGNOSIS — Z89512 Acquired absence of left leg below knee: Secondary | ICD-10-CM

## 2016-05-01 DIAGNOSIS — J449 Chronic obstructive pulmonary disease, unspecified: Secondary | ICD-10-CM

## 2016-05-01 DIAGNOSIS — G8929 Other chronic pain: Secondary | ICD-10-CM

## 2016-05-01 DIAGNOSIS — Z7901 Long term (current) use of anticoagulants: Secondary | ICD-10-CM

## 2016-05-01 DIAGNOSIS — S88112A Complete traumatic amputation at level between knee and ankle, left lower leg, initial encounter: Secondary | ICD-10-CM

## 2016-05-01 DIAGNOSIS — I10 Essential (primary) hypertension: Secondary | ICD-10-CM

## 2016-05-01 DIAGNOSIS — Z992 Dependence on renal dialysis: Secondary | ICD-10-CM

## 2016-05-01 DIAGNOSIS — E118 Type 2 diabetes mellitus with unspecified complications: Secondary | ICD-10-CM

## 2016-05-01 DIAGNOSIS — N186 End stage renal disease: Secondary | ICD-10-CM

## 2016-05-01 LAB — PROTIME-INR
INR: 2.25
Prothrombin Time: 25.2 seconds — ABNORMAL HIGH (ref 11.4–15.2)

## 2016-05-01 LAB — BPAM RBC
BLOOD PRODUCT EXPIRATION DATE: 201804122359
Blood Product Expiration Date: 201804122359
ISSUE DATE / TIME: 201803210950
ISSUE DATE / TIME: 201803210950
UNIT TYPE AND RH: 6200
UNIT TYPE AND RH: 6200

## 2016-05-01 LAB — TYPE AND SCREEN
ABO/RH(D): A POS
Antibody Screen: NEGATIVE
UNIT DIVISION: 0
Unit division: 0

## 2016-05-01 LAB — HEPARIN LEVEL (UNFRACTIONATED): Heparin Unfractionated: <0.1 IU/mL — ABNORMAL LOW (ref 0.30–0.70)

## 2016-05-01 LAB — GLUCOSE, CAPILLARY
GLUCOSE-CAPILLARY: 97 mg/dL (ref 65–99)
GLUCOSE-CAPILLARY: 119 mg/dL — AB (ref 65–99)
Glucose-Capillary: 124 mg/dL — ABNORMAL HIGH (ref 65–99)

## 2016-05-01 MED ORDER — BOOST / RESOURCE BREEZE PO LIQD
1.0000 | Freq: Two times a day (BID) | ORAL | Status: DC
  Administered 2016-05-02 – 2016-05-06 (×7): 1 via ORAL

## 2016-05-01 MED ORDER — HEPARIN (PORCINE) IN NACL 100-0.45 UNIT/ML-% IJ SOLN
1400.0000 [IU]/h | INTRAMUSCULAR | Status: DC
  Administered 2016-05-01: 1300 [IU]/h via INTRAVENOUS
  Administered 2016-05-02: 1400 [IU]/h via INTRAVENOUS
  Filled 2016-05-01 (×2): qty 250

## 2016-05-01 MED ORDER — WARFARIN SODIUM 4 MG PO TABS
4.0000 mg | ORAL_TABLET | Freq: Once | ORAL | Status: AC
  Administered 2016-05-01: 4 mg via ORAL
  Filled 2016-05-01: qty 1

## 2016-05-01 NOTE — Progress Notes (Addendum)
ANTICOAGULATION CONSULT NOTE - Follow Up Consult  Pharmacy Consult for Heparin bridge to Warfarin Indication: AVR  Allergies  Allergen Reactions  . Acetaminophen Other (See Comments)    Liver transplant recipient   . Morphine Itching and Other (See Comments)    hallucinate  . Codeine Itching  . Mirtazapine Other (See Comments)    hallucination    Patient Measurements: Height: 5\' 4"  (162.6 cm) Weight: 119 lb 0.8 oz (54 kg) IBW/kg (Calculated) : 54.7 Heparin Dosing Weight: 54 kg  Vital Signs: Temp: 97.6 F (36.4 C) (03/22 1345) Temp Source: Oral (03/22 1345) BP: 101/43 (03/22 1345) Pulse Rate: 63 (03/22 1345)  Labs:  Recent Labs  04/29/16 0527 04/30/16 0709 04/30/16 0843 05/01/16 0519 05/01/16 1802  HGB 8.3* 8.0*  --   --   --   HCT 24.8* 23.8*  --   --   --   PLT 193 184  --   --   --   LABPROT 22.1*  --  27.8* 25.2*  --   INR 1.90  --  2.53 2.25  --   HEPARINUNFRC 0.34  --  0.44  --  <0.10*  CREATININE  --  5.35*  --   --   --     Estimated Creatinine Clearance: 9.4 mL/min (A) (by C-G formula based on SCr of 5.35 mg/dL (H)).   Medications:  Scheduled:  . aspirin EC  81 mg Oral Daily  . [START ON 05/02/2016] calcitRIOL  0.5 mcg Oral Q M,W,F-HD  . calcium acetate  667 mg Oral TID WC  . [START ON 05/05/2016] darbepoetin (ARANESP) injection - DIALYSIS  100 mcg Intravenous Q Mon-HD  . feeding supplement  1 Container Oral BID BM  . feeding supplement (PRO-STAT SUGAR FREE 64)  30 mL Oral BID  . [START ON 05/02/2016] gabapentin  100 mg Oral Q M,W,F-HD  . gabapentin  300 mg Oral QHS  . insulin aspart  0-5 Units Subcutaneous QHS  . insulin aspart  0-9 Units Subcutaneous TID WC  . levothyroxine  175 mcg Oral QAC breakfast  . metoprolol tartrate  25 mg Oral BID  . multivitamin  1 tablet Oral QHS  . pantoprazole  40 mg Oral Daily  . polyethylene glycol  17 g Oral Daily  . tacrolimus  2 mg Oral BID  . Warfarin - Pharmacist Dosing Inpatient   Does not apply q1800    Infusions:  . heparin 1,300 Units/hr (05/01/16 1343)    Assessment: 61 yo female with AVR is currently on subtherapeutic coumadin.  INR has gone down to 2.25 from 2.53.  VVS's intention is to have patient on heparin when INR is < 2.5.  Patient was therapeutic on heparin at 1300 units/hr previously. Initial level now <0.1 -- no bolus with elevated INR and only 6 heparin level. Prior oozing from BKA site- no bleeding currently reported.   Goal of Therapy:  Heparin level 0.3-0.7 units/ml; INR 2.5-3.5 Monitor platelets by anticoagulation protocol: Yes   Plan:  - Increase heparin at 1400 units/hr. No bolus. - 8hr heparin level - ok to do with am labs   Sloan Leiter, PharmD, BCPS Clinical Pharmacist Clinical Phone 05/01/2016 until 11 PM - #[phone] After hours, please call [phone] 05/01/2016,9:00 PM

## 2016-05-01 NOTE — Evaluation (Signed)
Occupational Therapy Assessment and Plan  Patient Details  Name: [name] MRN: [id number] Date of Birth: [birth year]  OT Diagnosis: abnormal posture, acute pain, muscle weakness (generalized) and pain in joint Rehab Potential: Rehab Potential (ACUTE ONLY): Good ELOS: 10-12 days   Today's Date: 05/01/2016 OT Individual Time: 0800-0905 OT Individual Time Calculation (min): 65 min     Problem List:  Patient Active Problem List   Diagnosis Date Noted  . Chronic midline low back pain without sciatica   . ESRD on dialysis ([place])   . Type 2 diabetes mellitus with complication, with long-term current use of insulin ([place])   . S/P unilateral BKA (below knee amputation), left ([place]) 04/30/2016  . Unilateral complete BKA, left, initial encounter ([place]) 04/30/2016  . Abnormality of gait   . Coronary artery disease involving coronary bypass graft of native heart without angina pectoris   . Benign essential HTN   . History of migraine   . Leukocytosis   . Critical lower limb ischemia 04/22/2016  . Foot pain 04/21/2016  . Fever   . Advance care planning   . Goals of care, counseling/discussion   . Palliative care by specialist   . Sepsis ([place])   . Hypotension   . Claudication of left lower extremity ([place])   . Elevated troponin 03/05/2016  . Pressure injury of skin, stage 2 11/27/2015  . H/O unilateral nephrectomy 07/06/2015  . Paroxysmal SVT (supraventricular tachycardia) ([place]) 06/22/2014  . Complications due to renal dialysis device, implant, and graft 05/11/2014  . Acute blood loss anemia 03/31/2014  . Renal dialysis device, implant, or graft complication 94/50/3888  . Acute hyperkalemia   . S/P liver transplant ([place])   . Chronic hepatitis C without hepatic coma ([place])   . Pulmonary edema 01/03/2014  . Respiratory failure ([place]) 01/03/2014  . Long term current use of anticoagulant therapy 04/22/2013  . Chronic anticoagulation w/ coumadin, goal INR 2.5-3.0 w/ AVR 04/14/2013  .  Tobacco abuse 02/28/2013  . COPD (chronic obstructive pulmonary disease) ([place]) 02/21/2013  . Chronic combined systolic and diastolic congestive heart failure ([place]) 02/06/2013  . Acute respiratory failure ([place]) 11/02/2012  . Acute pulmonary edema ([place]) 11/02/2012  . Acute on chronic systolic CHF (congestive heart failure) ([place]) 11/02/2012  . Dyslipidemia-LDL 104, not on statin with Hx of liver transplant 07/30/2012  . CAD (coronary artery disease), native coronary artery 07/27/2012  . History of prosthetic aortic valve replacement   . End stage renal disease on dialysis ([place]) 02/27/2011  . Hypothyroidism 06/25/2006  . Type 2 diabetes mellitus with chronic kidney disease ([place]) 06/25/2006  . GERD 06/25/2006  . Anemia due to chronic renal failure    . Hypertensive heart disease     Past Medical History:  Past Medical History:  Diagnosis Date  . Anemia    takes Folic Acid daily  . Anxiety   . Aortic stenosis   . Arthritis    "left hand, back" (08/30/2012)  . Asthma   . CAD (coronary artery disease)   . CAD (coronary artery disease) Jan. 2015   Cath: 20% LAD, 50% D1; s/p LIMA-LAD  . Chest pain 03/06/2015  . Chronic back pain   . Chronic bronchitis ([place])    "q yr w/season changes" (08/30/2012)  . Chronic constipation    takes MIralax and Colace daily  . COPD (chronic obstructive pulmonary disease) ([place])   . Depression    takes Cymbalta for "severe" depression  . End stage renal disease on dialysis ([place]) 02/27/2011  Kidneys shut down at time of liver transplant in Sept 2011 at [facility] in [place], she has been on HD ever since.  Dialyzes at [facility] on TTS schedule.  Had L forearm graft used 10 months then removed Dec 2012 due to suspected infection.  A right upper arm AVG was placed Dec 2012 but she developed steal symptoms acutely and it was ligated the same day.  Never had an AV fistula due to small veins.  Now has L thigh AVG put in Jan 2013, has not clotted to date.    [name] GERD  (gastroesophageal reflux disease)    takes Omeprazole daily  . Headache    "at least monthly" (08/30/2012)  . Hepatitis C   . History of blood transfusion    "several" (08/30/2012)  . Hypertension    takes Metoprolol and Lisinopril daily, sees Dr Bea Graff  . Hypothyroidism    takes Synthroid daily  . Migraine    "last migraine was in 2013" (08/30/2012)  . Neuromuscular disorder ([place])    carpal tunnel in right hand  . Obesity   . Peripheral vascular disease (HCC) hands and legs  . Pneumonia    "today and several times before" (08/30/2012)  . 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 S/P unilateral BKA (below knee amputation), left ([place]) 04/30/2016  . SVT (supraventricular tachycardia) ([place]) 06/09/14  . Tobacco abuse   . Type II diabetes mellitus (HCC)    Levemir 2units daily if > 150   Past Surgical History:  Past Surgical History:  Procedure Laterality Date  . AMPUTATION Left 04/24/2016   Procedure: AMPUTATION BELOW KNEE;  Surgeon: Angelia Mould, MD;  Location: [place];  Service: Vascular;  Laterality: Left;  . AORTIC VALVE REPLACEMENT N/A 03/15/2013   AVR; Surgeon: Ivin Poot, MD;  Location: [facility] OR; Open Heart Surgery;  46m[employer] mechanical prosthesis, top hat valve  . ARTERIOVENOUS GRAFT PLACEMENT Left 10/03/10    forearm  . AV FISTULA PLACEMENT  01/29/2011   Procedure: INSERTION OF ARTERIOVENOUS (AV) GORE-TEX GRAFT ARM;  Surgeon: CElam Dutch MD;  [facility]  Service: Vascular;  Laterality: Right;  . AV FISTULA PLACEMENT  03/10/2011   Procedure: INSERTION OF ARTERIOVENOUS (AV) GORE-TEX GRAFT THIGH;  Surgeon: CElam Dutch MD;  Location: [place]  Service: Vascular;  Laterality: Left;  . AWagon WheelREMOVAL  12/23/2010   Procedure: REMOVAL OF ARTERIOVENOUS GORETEX GRAFT ([place];  Surgeon: CElam Dutch MD;  [place]   Service: Vascular;  Laterality: Left;  procedure started @1736 -1852  . CHOLECYSTECTOMY  1993  . CORONARY ARTERY BYPASS GRAFT N/A 03/15/2013   Procedure: CORONARY ARTERY BYPASS GRAFTING (CABG) times one using left internal mammary artery.;  Surgeon: PIvin Poot MD;  [place]  Service: Open Heart Surgery;  Laterality: N/A;  POSS CABG X 1  . CYSTOSCOPY  1990's  . INSERTION OF DIALYSIS CATHETER  12/23/2010   Procedure: INSERTION OF DIALYSIS CATHETER;  Surgeon: CElam Dutch MD;  [place]  Service: Vascular;  Laterality: Right;  Right Internal Jugular 28cm dialysis catheter insertion procedure time 1701-1720   . INSERTION OF DIALYSIS CATHETER Right 03/11/2016   Procedure: INSERTION OF DIALYSIS CATHETER;  Surgeon: CAngelia Mould MD;  Location: [place]  Service: Vascular;  Laterality: Right;  .  INTRAOPERATIVE TRANSESOPHAGEAL ECHOCARDIOGRAM N/A 03/15/2013   Procedure: INTRAOPERATIVE TRANSESOPHAGEAL ECHOCARDIOGRAM;  Surgeon: Ivin Poot, MD;  Location: [place];  Service: Open Heart Surgery;  Laterality: N/A;  . LEFT HEART CATHETERIZATION WITH CORONARY ANGIOGRAM N/A 07/29/2012   Procedure: LEFT HEART CATHETERIZATION WITH CORONARY ANGIOGRAM;  Surgeon: Troy Sine, MD;  Location[facility] LAB;  Service: Cardiovascular;  Laterality: N/A;  . LEFT HEART CATHETERIZATION WITH CORONARY ANGIOGRAM N/A 03/10/2013   Procedure: LEFT HEART CATHETERIZATION WITH CORONARY ANGIOGRAM;  Surgeon: Troy Sine, MD;  Location: [facility] CATH LAB;  Service: Cardiovascular;  Laterality: N/A;  . LEFT HEART CATHETERIZATION WITH CORONARY/GRAFT ANGIOGRAM N/A 12/24/2011   Procedure: LEFT HEART CATHETERIZATION WITH Beatrix Fetters;  Surgeon: [name], MD;  Location: [facility] CATH LAB;  Service: Cardiovascular;  Laterality: N/A;  . LEFT HEART CATHETERIZATION WITH CORONARY/GRAFT ANGIOGRAM N/A 12/16/2013   Procedure: LEFT HEART CATHETERIZATION WITH Beatrix Fetters;  Surgeon: Troy Sine, MD;   Location: [facility] CATH LAB;  Service: Cardiovascular;  Laterality: N/A;  . LIGATION ARTERIOVENOUS GORTEX GRAFT Left 03/11/2016   Procedure: LIGATION THIGH ARTERIOVENOUS GORTEX GRAFT;  Surgeon: Angelia Mould, MD;  Location: [place];  Service: Vascular;  Laterality: Left;  . LIVER TRANSPLANT  10/25/2009   sees Dr Ferol Luz 1 every 6 months, saw last in Dec 2013. [name] Coord [phone]  . PERIPHERAL VASCULAR CATHETERIZATION N/A 11/06/2015   Procedure: Abdominal Aortogram;  Surgeon: Serafina Mitchell, MD;  Location: [place] CV LAB;  Service: Cardiovascular;  Laterality: N/A;  . PERIPHERAL VASCULAR CATHETERIZATION N/A 11/06/2015   Procedure: Lower Extremity Angiography;  Surgeon: Serafina Mitchell, MD;  Location: [place] CV LAB;  Service: Cardiovascular;  Laterality: N/A;  . PERIPHERAL VASCULAR CATHETERIZATION  11/06/2015   Procedure: Peripheral Vascular Atherectomy;  Surgeon: Serafina Mitchell, MD;  Location: [place] CV LAB;  Service: Cardiovascular;;  Left Superficial femoral  . SHUNTOGRAM Left 05/15/2014   Procedure: SHUNTOGRAM;  Surgeon: Conrad [place], MD;  Location[facility] C[facility] LAB;  Service: Cardiovascular;  Laterality: Left;  . SMALL INTESTINE SURGERY  90's  . SPINAL GROWTH RODS  2010   "put 2 metal rods in my back; they had detetriorated" (08/30/2012)  . THROMBECTOMY    . THROMBECTOMY AND REVISION OF ARTERIOVENTOUS (AV) GORETEX  GRAFT Left 03/30/2014  . THROMBECTOMY AND REVISION OF ARTERIOVENTOUS (AV) GORETEX  GRAFT Left 03/30/2014   Procedure: THROMBECTOMY AND REVISION OF ARTERIOVENTOUS (AV) GORETEX  GRAFT;  Surgeon: Conrad [place], MD;  Location: [place];  Service: Vascular;  Laterality: Left;  . TUBAL LIGATION  1990's    Assessment & Plan Clinical Impression: Patient is a 61 y.o. year old female with recent admission to the hospital on 04/21/16 with excruciating pain and inability to stand. Chronic coumadin held with heparin bridge till INR normalized. She underwent lexiscan and was cleared  for surgery by cardiology. She underwent L-BKA by Dr. Scot Dock on 04/24/16. .  Patient transferred to CIR on 04/30/2016 .    Patient currently requires mod with basic self-care skills secondary to muscle weakness and decreased sitting balance, decreased standing balance and decreased balance strategies.  Prior to hospitalization, patient could complete ADLs with independent .  Patient will benefit from skilled intervention to decrease level of assist with basic self-care skills and increase independence with basic self-care skills prior to discharge home with care partner.  Anticipate patient will require intermittent supervision and follow up home health.  OT - End of Session Activity Tolerance: Tolerates 30+ min activity  with multiple rests Endurance Deficit: Yes Endurance Deficit Description: Pt voiced fatigue with completion of ADL tasks.  OT Assessment Rehab Potential (ACUTE ONLY): Good OT Patient demonstrates impairments in the following area(s): Balance;Motor;Endurance;Cognition;Pain OT Basic ADL's Functional Problem(s): Grooming;Bathing;Dressing;Toileting OT Advanced ADL's Functional Problem(s): Simple Meal Preparation;Light Housekeeping OT Transfers Functional Problem(s): Toilet;Tub/Shower OT Plan OT Intensity: Minimum of 1-2 x/day, 45 to 90 minutes OT Frequency: 5 out of 7 days OT Duration/Estimated Length of Stay: 10-12 days OT Treatment/Interventions: Balance/vestibular training;Cognitive remediation/compensation;Community reintegration;Discharge planning;Self Care/advanced ADL retraining;Therapeutic Activities;UE/LE Coordination activities;Patient/family education;Functional mobility training;Neuromuscular re-education;DME/adaptive equipment instruction;UE/LE Strength taining/ROM;Therapeutic Exercise OT Self Feeding Anticipated Outcome(s): independent OT Basic Self-Care Anticipated Outcome(s): supervision OT Toileting Anticipated Outcome(s): supervision OT Bathroom Transfers  Anticipated Outcome(s): supervision OT Recommendation Patient destination: Home Follow Up Recommendations: Home health OT Equipment Recommended: 3 in 1 bedside comode;Rolling walker with 5" wheels   Skilled Therapeutic Intervention Pt worked on selfcare retraining sit to stand on the EOB during session.  Supervision for all UB bathing sitting unsupported.  Mod assist for standing balance when completing peri washing.  No clothing present yet on eval so donned gripper socks and hospital gowns.  Completed wrapping of residual limb on EOB as well.  Increased pain in the LLE noted.  Also applied KI as well and educated pt on the importance of positioning to maintain knee extension for future prosthetic training.  Finished session with pt left in wheelchair with daughter present.  Safety belt in place and call button in reach.    OT Evaluation Precautions/Restrictions  Precautions Precautions: Fall Required Braces or Orthoses: Knee Immobilizer - Left Restrictions Weight Bearing Restrictions: Yes LLE Weight Bearing: Non weight bearing  Vital Signs Therapy Vitals Temp: 97.6 F (36.4 C) Temp Source: Oral Pulse Rate: 63 Resp: 17 BP: (!) 101/43 Patient Position (if appropriate): Sitting Oxygen Therapy SpO2: 98 % O2 Device: Not Delivered Pain Pain Assessment Pain Assessment: 0-10 Faces Pain Scale: Hurts whole lot Pain Type: Acute pain;Surgical pain Pain Location: Leg Pain Orientation: Left Pain Descriptors / Indicators: Sharp;Shooting;Aching Pain Frequency: Constant Pain Onset: On-going Patients Stated Pain Goal: 4 Pain Intervention(s): Medication (See eMAR) Home Living/Prior [place] expects to be discharged to:: Private residence Living Arrangements: Children Available Help at Discharge: Family, Available PRN/intermittently Type of Home: Mobile home Home Access: Stairs to enter Technical brewer of Steps: 5 Entrance Stairs-Rails: Right,  Left, Can reach both Home Layout: One level Bathroom Shower/Tub: Chiropodist: Handicapped height Bathroom Accessibility: Yes Additional Comments: community is building a ramp  Lives With: Family IADL History Homemaking Responsibilities: Yes Meal Prep Responsibility: Secondary Cleaning Responsibility: Secondary Current License: Yes Mode of Transportation: Car Prior Function Level of Independence: Independent with gait, Independent with transfers, Independent with basic ADLs, Independent with homemaking with ambulation  Able to Take Stairs?: Yes Driving: Yes Leisure: Hobbies-yes (Comment) Comments: Liked to sew ADL  See Function Section of chart for details  Vision/Perception  Vision- History Baseline Vision/History: Wears glasses Wears Glasses: At all times Patient Visual Report: No change from baseline Vision- Assessment Vision Assessment?: No apparent visual deficits  Cognition Overall Cognitive Status: Impaired/Different from baseline Arousal/Alertness: Awake/alert Orientation Level: Person;Place;Situation Person: Oriented Place: Oriented Situation: Oriented Year: 2018 Month: March Day of Week: Incorrect Memory: Impaired Memory Impairment: Decreased recall of new information Immediate Memory Recall: Sock;Blue;Bed Memory Recall: Sock;Blue;Bed Memory Recall Sock: Without Cue Memory Recall Blue: Without Cue Memory Recall Bed: Without Cue Attention: Sustained Sustained Attention: Impaired Sustained Attention Impairment: Functional basic Awareness: Impaired  Awareness Impairment: Anticipatory impairment (Pt initially not able to recall why she was in the hospital when asked.) Problem Solving: Impaired Safety/Judgment: Appears intact Sensation Sensation Light Touch: Appears Intact Light Touch Impaired Details: Impaired RLE Stereognosis: Appears Intact Hot/Cold: Appears Intact Proprioception: Appears Intact Additional Comments: sensation  intact in BUEs Coordination Gross Motor Movements are Fluid and Coordinated: Yes Fine Motor Movements are Fluid and Coordinated: Yes Motor  Motor Motor: Within Functional Limits;Abnormal postural alignment and control Motor - Skilled Clinical Observations: Increased posterior lean sitting EOB during ADL. Mobility  Bed Mobility Bed Mobility: Sit to Supine;Supine to Sit Rolling Right: 5: Supervision Supine to Sit: 5: Supervision;With rails;HOB flat Sit to Supine: 5: Supervision;With rail;HOB flat Transfers Transfers: Sit to Stand Sit to Stand: 3: Mod assist;With armrests;From bed;With upper extremity assist Stand to Sit: 3: Mod assist;With armrests;With upper extremity assist  Trunk/Postural Assessment  Cervical Assessment Cervical Assessment: Within Functional Limits Thoracic Assessment Thoracic Assessment: Exceptions to [facility] (thoracic kyphosis in sitting) Lumbar Assessment Lumbar Assessment: Exceptions to [facility] (posterior tilt) Postural Control Postural Control: Deficits on evaluation Protective Responses: impaired s/p BKA  Balance Balance Balance Assessed: Yes Static Sitting Balance Static Sitting - Balance Support: No upper extremity supported Static Sitting - Level of Assistance: 5: Stand by assistance Dynamic Sitting Balance Dynamic Sitting - Balance Support: Feet supported;During functional activity Dynamic Sitting - Level of Assistance: 5: Stand by assistance Static Standing Balance Static Standing - Balance Support: During functional activity;Bilateral upper extremity supported Static Standing - Level of Assistance: 4: Min assist Dynamic Standing Balance Dynamic Standing - Balance Support: Right upper extremity supported;Left upper extremity supported Dynamic Standing - Level of Assistance: 3: Mod assist Extremity/Trunk Assessment RUE Assessment RUE Assessment: Within Functional Limits LUE Assessment LUE Assessment: Within Functional Limits   See Function  Navigator for Current Functional Status.   Refer to Care Plan for Long Term Goals  Recommendations for other services: None    Discharge Criteria: Patient will be discharged from OT if patient refuses treatment 3 consecutive times without medical reason, if treatment goals not met, if there is a change in medical status, if patient makes no progress towards goals or if patient is discharged from hospital.  The above assessment, treatment plan, treatment alternatives and goals were discussed and mutually agreed upon: by patient  Jeffren Dombek OTR/L 05/01/2016, 4:22 PM

## 2016-05-01 NOTE — Evaluation (Signed)
Physical Therapy Assessment and Plan  Patient Details  Name: [name] MRN: [id number] Date of Birth: Jan 17, 1956  PT Diagnosis: Abnormal posture, Cognitive deficits, Difficulty walking, Edema, Impaired sensation, Muscle weakness and Pain in left residual limb Rehab Potential: Good ELOS: 10-12 days   Today's Date: 05/01/2016 PT Individual Time: 1005-1100 PT Individual Time Calculation (min): 55 min    Problem List:  Patient Active Problem List   Diagnosis Date Noted  . Chronic midline low back pain without sciatica   . ESRD on dialysis ([place])   . Type 2 diabetes mellitus with complication, with long-term current use of insulin ([place])   . S/P unilateral BKA (below knee amputation), left ([place]) 04/30/2016  . Unilateral complete BKA, left, initial encounter ([place]) 04/30/2016  . Abnormality of gait   . Coronary artery disease involving coronary bypass graft of native heart without angina pectoris   . Benign essential HTN   . History of migraine   . Leukocytosis   . Critical lower limb ischemia 04/22/2016  . Foot pain 04/21/2016  . Fever   . Advance care planning   . Goals of care, counseling/discussion   . Palliative care by specialist   . Sepsis ([place])   . Hypotension   . Claudication of left lower extremity ([place])   . Elevated troponin 03/05/2016  . Pressure injury of skin, stage 2 11/27/2015  . H/O unilateral nephrectomy 07/06/2015  . Paroxysmal SVT (supraventricular tachycardia) ([place]) 06/22/2014  . Complications due to renal dialysis device, implant, and graft 05/11/2014  . Acute blood loss anemia 03/31/2014  . Renal dialysis device, implant, or graft complication 62/13/0865  . Acute hyperkalemia   . S/P liver transplant ([place])   . Chronic hepatitis C without hepatic coma ([place])   . Pulmonary edema 01/03/2014  . Respiratory failure ([place]) 01/03/2014  . Long term current use of anticoagulant therapy 04/22/2013  . Chronic anticoagulation w/ coumadin, goal INR 2.5-3.0 w/ AVR  04/14/2013  . Tobacco abuse 02/28/2013  . COPD (chronic obstructive pulmonary disease) ([place]) 02/21/2013  . Chronic combined systolic and diastolic congestive heart failure ([place]) 02/06/2013  . Acute respiratory failure ([place]) 11/02/2012  . Acute pulmonary edema ([place]) 11/02/2012  . Acute on chronic systolic CHF (congestive heart failure) ([place]) 11/02/2012  . Dyslipidemia-LDL 104, not on statin with Hx of liver transplant 07/30/2012  . CAD (coronary artery disease), native coronary artery 07/27/2012  . History of prosthetic aortic valve replacement   . End stage renal disease on dialysis ([place]) 02/27/2011  . Hypothyroidism 06/25/2006  . Type 2 diabetes mellitus with chronic kidney disease ([place]) 06/25/2006  . GERD 06/25/2006  . Anemia due to chronic renal failure    . Hypertensive heart disease     Past Medical History:  Past Medical History:  Diagnosis Date  . Anemia    takes Folic Acid daily  . Anxiety   . Aortic stenosis   . Arthritis    "left hand, back" (08/30/2012)  . Asthma   . CAD (coronary artery disease)   . CAD (coronary artery disease) Jan. 2015   Cath: 20% LAD, 50% D1; s/p LIMA-LAD  . Chest pain 03/06/2015  . Chronic back pain   . Chronic bronchitis ([place])    "q yr w/season changes" (08/30/2012)  . Chronic constipation    takes MIralax and Colace daily  . COPD (chronic obstructive pulmonary disease) ([employer])   . Depression    takes Cymbalta for "severe" depression  . End stage renal disease on dialysis [facility])  02/27/2011   Kidneys shut down at time of liver transplant in Sept 2011 at [facility] in [place], she has been on HD ever since.  Dialyzes at [facility] HD on TTS schedule.  Had L forearm graft used 10 months then removed Dec 2012 due to suspected infection.  A right upper arm AVG was placed Dec 2012 but she developed steal symptoms acutely and it was ligated the same day.  Never had an AV fistula due to small veins.  Now has L thigh AVG put in Jan 2013, has not clotted to  date.    [name] GERD (gastroesophageal reflux disease)    takes Omeprazole daily  . Headache    "at least monthly" (08/30/2012)  . Hepatitis C   . History of blood transfusion    "several" (08/30/2012)  . Hypertension    takes Metoprolol and Lisinopril daily, sees Dr Bea Graff  . Hypothyroidism    takes Synthroid daily  . Migraine    "last migraine was in 2013" (08/30/2012)  . Neuromuscular disorder ([place])    carpal tunnel in right hand  . Obesity   . Peripheral vascular disease (HCC) hands and legs  . Pneumonia    "today and several times before" (08/30/2012)  . 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 S/P unilateral BKA (below knee amputation), left ([place]) 04/30/2016  . SVT (supraventricular tachycardia) ([place]) 06/09/14  . Tobacco abuse   . Type II diabetes mellitus (HCC)    Levemir 2units daily if > 150   Past Surgical History:  Past Surgical History:  Procedure Laterality Date  . AMPUTATION Left 04/24/2016   Procedure: AMPUTATION BELOW KNEE;  Surgeon: Angelia Mould, MD;  Location: [place];  Service: Vascular;  Laterality: Left;  . AORTIC VALVE REPLACEMENT N/A 03/15/2013   AVR; Surgeon: Ivin Poot, MD;  Location: [facility] OR; Open Heart Surgery;  11m[employer] mechanical prosthesis, top hat valve  . ARTERIOVENOUS GRAFT PLACEMENT Left 10/03/10    forearm  . AV FISTULA PLACEMENT  01/29/2011   Procedure: INSERTION OF ARTERIOVENOUS (AV) GORE-TEX GRAFT ARM;  Surgeon: CElam Dutch MD;  [facility];  Service: Vascular;  Laterality: Right;  . AV FISTULA PLACEMENT  03/10/2011   Procedure: INSERTION OF ARTERIOVENOUS (AV) GORE-TEX GRAFT THIGH;  Surgeon: CElam Dutch MD;  [place]  Service: Vascular;  Laterality: Left;  . A[place]ratfordREMOVAL  12/23/2010   Procedure: REMOVAL OF ARTERIOVENOUS GORETEX GRAFT ([place];  Surgeon: CElam Dutch MD;   [place]  Service: Vascular;  Laterality: Left;  procedure started _0 -1852  . CHOLECYSTECTOMY  1993  . CORONARY ARTERY BYPASS GRAFT N/A 03/15/2013   Procedure: CORONARY ARTERY BYPASS GRAFTING (CABG) times one using left internal mammary artery.;  Surgeon: PIvin Poot MD;  [place]  Service: Open Heart Surgery;  Laterality: N/A;  POSS CABG X 1  . CYSTOSCOPY  1990's  . INSERTION OF DIALYSIS CATHETER  12/23/2010   Procedure: INSERTION OF DIALYSIS CATHETER;  Surgeon: CElam Dutch MD;  [place]  Service: Vascular;  Laterality: Right;  Right Internal Jugular 28cm dialysis catheter insertion procedure time 1701-1720   . INSERTION OF DIALYSIS CATHETER Right 03/11/2016   Procedure: INSERTION OF DIALYSIS CATHETER;  Surgeon: CAngelia Mould MD;  [place]  Service: Vascular;  Laterality:  Right;  [name] INTRAOPERATIVE TRANSESOPHAGEAL ECHOCARDIOGRAM N/A 03/15/2013   Procedure: INTRAOPERATIVE TRANSESOPHAGEAL ECHOCARDIOGRAM;  Surgeon: Ivin Poot, MD;  Location: [place];  Service: Open Heart Surgery;  Laterality: N/A;  . LEFT HEART CATHETERIZATION WITH CORONARY ANGIOGRAM N/A 07/29/2012   Procedure: LEFT HEART CATHETERIZATION WITH CORONARY ANGIOGRAM;  Surgeon: Troy Sine, MD;  Location: [facility] CATH LAB;  Service: Cardiovascular;  Laterality: N/A;  . LEFT HEART CATHETERIZATION WITH CORONARY ANGIOGRAM N/A 03/10/2013   Procedure: LEFT HEART CATHETERIZATION WITH CORONARY ANGIOGRAM;  Surgeon: Troy Sine, MD;  Location: [facility] CATH LAB;  Service: Cardiovascular;  Laterality: N/A;  . LEFT HEART CATHETERIZATION WITH CORONARY/GRAFT ANGIOGRAM N/A 12/24/2011   Procedure: LEFT HEART CATHETERIZATION WITH Beatrix Fetters;  Surgeon: [name], MD;  Location: [facility] CATH LAB;  Service: Cardiovascular;  Laterality: N/A;  . LEFT HEART CATHETERIZATION WITH CORONARY/GRAFT ANGIOGRAM N/A 12/16/2013   Procedure: LEFT HEART CATHETERIZATION WITH Beatrix Fetters;  Surgeon: Troy Sine, MD;  Location: [facility] CATH LAB;  Service: Cardiovascular;  Laterality: N/A;  . LIGATION ARTERIOVENOUS GORTEX GRAFT Left 03/11/2016   Procedure: LIGATION THIGH ARTERIOVENOUS GORTEX GRAFT;  Surgeon: Angelia Mould, MD;  Location: [place];  Service: Vascular;  Laterality: Left;  . LIVER TRANSPLANT  10/25/2009   sees Dr Ferol Luz 1 every 6 months, saw last in Dec 2013. [name] Coord [phone]  . PERIPHERAL VASCULAR CATHETERIZATION N/A 11/06/2015   Procedure: Abdominal Aortogram;  Surgeon: Serafina Mitchell, MD;  Location: [place] CV LAB;  Service: Cardiovascular;  Laterality: N/A;  . PERIPHERAL VASCULAR CATHETERIZATION N/A 11/06/2015   Procedure: Lower Extremity Angiography;  Surgeon: Serafina Mitchell, MD;  Location: [place] CV LAB;  Service: Cardiovascular;  Laterality: N/A;  . PERIPHERAL VASCULAR CATHETERIZATION  11/06/2015   Procedure: Peripheral Vascular Atherectomy;  Surgeon: Serafina Mitchell, MD;  Location: [place] CV LAB;  Service: Cardiovascular;;  Left Superficial femoral  . SHUNTOGRAM Left 05/15/2014   Procedure: SHUNTOGRAM;  Surgeon: Conrad Rockland, MD;  Location: [facility] CATH LAB;  Service: Cardiovascular;  Laterality: Left;  . SMALL INTESTINE SURGERY  90's  . SPINAL GROWTH RODS  2010   "put 2 metal rods in my back; they had detetriorated" (08/30/2012)  . THROMBECTOMY    . THROMBECTOMY AND REVISION OF ARTERIOVENTOUS (AV) GORETEX  GRAFT Left 03/30/2014  . THROMBECTOMY AND REVISION OF ARTERIOVENTOUS (AV) GORETEX  GRAFT Left 03/30/2014   Procedure: THROMBECTOMY AND REVISION OF ARTERIOVENTOUS (AV) GORETEX  GRAFT;  Surgeon: Conrad Bellevue, MD;  Location: [place];  Service: Vascular;  Laterality: Left;  . TUBAL LIGATION  1990's    Assessment & Plan Clinical Impression: ZLATY ALEXA a 61 y.o.femalewith history of ESRD--HD MWF, Hep C s/p liver transplant, CAD s/p CABG with AVR, chronic pain, PAD with chronic heel ulcer and ischemic toes. She has had progressive pain and was in  process of being set for BKA. She was admitted on 04/21/16 with excruciating pain and inability to stand. Chronic coumadin held with heparin bridge till INR normalized. She underwent lexiscan and was cleared for surgery by cardiology. She underwent L-BKA by Dr. Scot Dock on 04/24/16.  Continues on IV heparin as INR subtherapeutic. Noted to have drop in H/H to 8.0/23.8 and to be transfused with 2 units PRBC. She continues to be limited by lethargy, intermittent confusion and pain affecting ADL tasks and mobility.Patient transferred to CIR on 04/30/2016.   Patient currently requires max with mobility secondary to muscle weakness, muscle joint tightness and pain, decreased cardiorespiratoy  endurance, unbalanced muscle activation, decreased attention, decreased awareness, decreased problem solving, decreased safety awareness, decreased memory and delayed processing and decreased standing balance, decreased postural control and decreased balance strategies.  Prior to hospitalization, patient was modified independent  with mobility and lived with Family in a Mobile home home.  Home access is 5Stairs to enter.  Patient will benefit from skilled PT intervention to maximize safe functional mobility, minimize fall risk and decrease caregiver burden for planned discharge home with 24 hour supervision.  Anticipate patient will benefit from follow up [place] at discharge.  PT - End of Session Activity Tolerance: Decreased this session;Tolerates < 10 min activity, no significant change in vital signs Endurance Deficit: Yes Endurance Deficit Description: requires rest breaks with functional mobility PT Assessment Rehab Potential (ACUTE/IP ONLY): Good Barriers to Discharge: Inaccessible home environment (ramp being built) PT Patient demonstrates impairments in the following area(s): Balance;Edema;Endurance;Motor;Nutrition;Pain;Safety;Sensory;Skin Integrity PT Transfers Functional Problem(s): Bed Mobility;Bed to  Chair;Car;Furniture PT Locomotion Functional Problem(s): Ambulation;Wheelchair Mobility;Stairs PT Plan PT Intensity: Minimum of 1-2 x/day ,45 to 90 minutes PT Frequency: 5 out of 7 days PT Duration Estimated Length of Stay: 10-12 days PT Treatment/Interventions: Ambulation/gait training;Cognitive remediation/compensation;Balance/vestibular training;Community reintegration;Discharge planning;Disease management/prevention;DME/adaptive equipment instruction;Functional mobility training;Neuromuscular re-education;Pain management;Patient/family education;Psychosocial support;Skin care/wound management;Stair training;Therapeutic Activities;Therapeutic Exercise;UE/LE Strength taining/ROM;Wheelchair propulsion/positioning;UE/LE Coordination activities PT Transfers Anticipated Outcome(s): supervision PT Locomotion Anticipated Outcome(s): supervision household PT Recommendation Recommendations for Other Services: Neuropsych consult Follow Up Recommendations: Home health PT;24 hour supervision/assistance Patient destination: Home Equipment Recommended: Wheelchair (measurements);Wheelchair cushion (measurements) Equipment Details: 16 x 16 with amputee support pad, owns RW  Skilled Therapeutic Intervention Skilled therapeutic intervention initiated after completion of evaluation. Discussed with patient and daughter falls risk, safety within room, focus of therapy during stay, possible length of stay, goals, and follow-up therapy. Switched wheelchair out for 16 x 16 wheelchair with apple board and amputee support pad for improved sitting tolerance and wheelchair propulsion efficiency. Patient performed UB/LB dressing with sit <> stand from wheelchair using RW, see function tab. Patient currently requires supervision for wheelchair propulsion, min A bed mobility on therapy mat, mod A for lateral scoot wheelchair <> mat table, max A sit <> stand from wheelchair using RW, and gait x 11 ft using RW with min A and max  verbal cues for safe technique. Patient left sitting in wheelchair with KI donned, chair alarm on, and daughter in room with needs in reach.   PT Evaluation Precautions/Restrictions Precautions Precautions: Fall Required Braces or Orthoses: Knee Immobilizer - Left Knee Immobilizer - Left: Other (comment) (on except in PT) Restrictions Weight Bearing Restrictions: Yes LLE Weight Bearing: Non weight bearing General Chart Reviewed: Yes Family/Caregiver Present: Yes (daughter Nira Conn)  Pain Pain Assessment Pain Assessment: Faces Faces Pain Scale: Hurts even more Pain Type: Acute pain;Surgical pain;Phantom pain Pain Location: Leg Pain Orientation: Left Pain Descriptors / Indicators: Sharp;Shooting;Aching Pain Onset: On-going Pain Intervention(s): Repositioned;Rest;Distraction Home Living/Prior Functioning Home Living Living Arrangements: Children Available Help at Discharge: Family;Available PRN/intermittently (daughter Nira Conn on FMLA, daughter Sharee Pimple goes to school) Type of Home: Mobile home Home Access: Stairs to enter Technical brewer of Steps: 5 Entrance Stairs-Rails: Right;Left;Can reach both Home Layout: One level Bathroom Shower/Tub: Chiropodist: Handicapped height Bathroom Accessibility: Yes Additional Comments: community is building a ramp  Lives With: Family Prior Function Level of Independence: Independent with gait;Requires assistive device for independence;Independent with transfers;Independent with basic ADLs;Independent with homemaking with ambulation  Able to Take Stairs?: Yes (with help) Driving: Yes Comments: Pt reports that her  daughter has been helpign her more lately due to LE pain Vision/Perception   No change from baseline  Sensation Sensation Light Touch: Impaired Detail Light Touch Impaired Details: Impaired RLE Coordination Gross Motor Movements are Fluid and Coordinated: No Fine Motor Movements are Fluid and  Coordinated: Yes Motor  Motor Motor: Within Functional Limits Motor - Skilled Clinical Observations: generalized weakness  Mobility Bed Mobility Bed Mobility: Sit to Supine;Supine to Sit;Rolling Right Rolling Right: 5: Supervision Supine to Sit: 4: Min assist Sit to Supine: 5: Supervision Transfers Transfers: Yes Sit to Stand: 2: Max assist;With upper extremity assist Stand to Sit: 2: Max assist;With upper extremity assist Lateral/Scoot Transfers: 3: Mod assist;With armrests removed Locomotion  Ambulation Ambulation: Yes Ambulation/Gait Assistance: 4: Min assist Ambulation Distance (Feet): 11 Feet Assistive device: Rolling walker Gait Gait: Yes Gait Pattern: Impaired Gait Pattern: Poor foot clearance - right;Trunk flexed (hop-to) Gait velocity: decreased Wheelchair Mobility Wheelchair Mobility: Yes Wheelchair Assistance: 5: Careers information officer: Both upper extremities Wheelchair Parts Management: Needs assistance Distance: 100 ft  Trunk/Postural Assessment  Cervical Assessment Cervical Assessment: Within Functional Limits Thoracic Assessment Thoracic Assessment: Exceptions to [facility] (flexed posture) Lumbar Assessment Lumbar Assessment: Exceptions to [facility] (posterior tilt) Postural Control Postural Control: Deficits on evaluation Protective Responses: impaired s/p BKA  Balance Balance Balance Assessed: Yes Static Standing Balance Static Standing - Balance Support: Bilateral upper extremity supported;During functional activity Static Standing - Level of Assistance: 4: Min assist Extremity Assessment  RLE Assessment RLE Assessment: Exceptions to [facility] RLE Strength RLE Overall Strength: Deficits RLE Overall Strength Comments: 4/5 except hip flexion 3/5, boggy heel LLE Assessment LLE Assessment: Within Functional Limits (hip and knee grossly 4/5, ankle NT due to BKA)   See Function Navigator for Current Functional Status.   Refer to Care Plan for Long  Term Goals  Recommendations for other services: Neuropsych  Discharge Criteria: Patient will be discharged from PT if patient refuses treatment 3 consecutive times without medical reason, if treatment goals not met, if there is a change in medical status, if patient makes no progress towards goals or if patient is discharged from hospital.  The above assessment, treatment plan, treatment alternatives and goals were discussed and mutually agreed upon: by patient and by family  Tayte Mcwherter, Murray Hodgkins 05/01/2016, 12:31 PM

## 2016-05-01 NOTE — Progress Notes (Signed)
ANTICOAGULATION CONSULT NOTE - Follow Up Consult  Pharmacy Consult for Heparin bridge to Warfarin Indication: AVR  Allergies  Allergen Reactions  . Acetaminophen Other (See Comments)    Liver transplant recipient   . Morphine Itching and Other (See Comments)    hallucinate  . Codeine Itching  . Mirtazapine Other (See Comments)    hallucination    Patient Measurements: Height: 5\' 4"  (162.6 cm) Weight: 119 lb 0.8 oz (54 kg) IBW/kg (Calculated) : 54.7 Heparin Dosing Weight: 54 kg  Vital Signs: Temp: 98.7 F (37.1 C) (03/22 0506) Temp Source: Oral (03/22 0506) BP: 127/70 (03/22 0843) Pulse Rate: 81 (03/22 0843)  Labs:  Recent Labs  04/29/16 0527 04/30/16 0709 04/30/16 0843 05/01/16 0519  HGB 8.3* 8.0*  --   --   HCT 24.8* 23.8*  --   --   PLT 193 184  --   --   LABPROT 22.1*  --  27.8* 25.2*  INR 1.90  --  2.53 2.25  HEPARINUNFRC 0.34  --  0.44  --   CREATININE  --  5.35*  --   --     Estimated Creatinine Clearance: 9.4 mL/min (A) (by C-G formula based on SCr of 5.35 mg/dL (H)).   Medications:  Scheduled:  . aspirin EC  81 mg Oral Daily  . [START ON 05/02/2016] calcitRIOL  0.5 mcg Oral Q M,W,F-HD  . calcium acetate  667 mg Oral TID WC  . [START ON 05/05/2016] darbepoetin (ARANESP) injection - DIALYSIS  100 mcg Intravenous Q Mon-HD  . feeding supplement (PRO-STAT SUGAR FREE 64)  30 mL Oral BID  . [START ON 05/02/2016] gabapentin  100 mg Oral Q M,W,F-HD  . gabapentin  300 mg Oral QHS  . insulin aspart  0-5 Units Subcutaneous QHS  . insulin aspart  0-9 Units Subcutaneous TID WC  . levothyroxine  175 mcg Oral QAC breakfast  . metoprolol tartrate  25 mg Oral BID  . multivitamin  1 tablet Oral QHS  . pantoprazole  40 mg Oral Daily  . polyethylene glycol  17 g Oral Daily  . tacrolimus  2 mg Oral BID  . Warfarin - Pharmacist Dosing Inpatient   Does not apply q1800   Infusions:    Assessment: 61 yo female with AVR is currently on subtherapeutic coumadin.  INR  has gone down to 2.25 from 2.53.  VVS's intention is to have patient on heparin when INR is < 2.5.  Patient was therapeutic on heparin at 1300 units/hr previously.  Goal of Therapy:  Heparin level 0.3-0.7 units/ml; INR 2.5-3.5 Monitor platelets by anticoagulation protocol: Yes   Plan:  - restart heparin at 1300 units/hr. 8hr heparin level - daily heparin level and CBC - coumadin 4 mg po x1 - INR in am  Shayla Heming, Tsz-Yin 05/01/2016,9:24 AM

## 2016-05-01 NOTE — Progress Notes (Signed)
   VASCULAR SURGERY ASSESSMENT & PLAN:   Postop day 7 status post left BKA  Doing well in rehabilitation.  Her Coumadin is therapeutic.   SUBJECTIVE:   No complaints.  PHYSICAL EXAM:   Vitals:   04/30/16 2138 05/01/16 0506 05/01/16 0843 05/01/16 1345  BP: 128/70 122/68 127/70 (!) 101/43  Pulse: 80 79 81 63  Resp:  18  17  Temp:  98.7 F (37.1 C)  97.6 F (36.4 C)  TempSrc:  Oral  Oral  SpO2:  99%  98%  Weight:  119 lb 0.8 oz (54 kg)    Height:       Dressing on left BKA is dry. She has on a knee immobilizer.  LABS:   Lab Results  Component Value Date   WBC 13.9 (H) 04/30/2016   HGB 8.0 (L) 04/30/2016   HCT 23.8 (L) 04/30/2016   MCV 87.2 04/30/2016   PLT 184 04/30/2016   Lab Results  Component Value Date   CREATININE 5.35 (H) 04/30/2016   Lab Results  Component Value Date   INR 2.25 05/01/2016   CBG (last 3)   Recent Labs  04/30/16 2143 05/01/16 0700 05/01/16 1124  GLUCAP 142* 97 124*    Active Problems:   S/P unilateral BKA (below knee amputation), left (HCC)   Unilateral complete BKA, left, initial encounter (HCC)   Chronic midline low back pain without sciatica   ESRD on dialysis ([place])   Type 2 diabetes mellitus with complication, with long-term current use of insulin [facility])   Gae Gallop Beeper: [phone] 05/01/2016

## 2016-05-01 NOTE — Progress Notes (Signed)
Social Work Assessment and Plan Social Work Assessment and Plan  Patient Details  Name: [name] MRN: [id number] Date of Birth: Nov 01, 1955  Today's Date: 05/01/2016  Problem List:  Patient Active Problem List   Diagnosis Date Noted  . Chronic midline low back pain without sciatica   . ESRD on dialysis ([place])   . Type 2 diabetes mellitus with complication, with long-term current use of insulin ([place])   . S/P unilateral BKA (below knee amputation), left ([place]) 04/30/2016  . Unilateral complete BKA, left, initial encounter ([place]) 04/30/2016  . Abnormality of gait   . Coronary artery disease involving coronary bypass graft of native heart without angina pectoris   . Benign essential HTN   . History of migraine   . Leukocytosis   . Critical lower limb ischemia 04/22/2016  . Foot pain 04/21/2016  . Fever   . Advance care planning   . Goals of care, counseling/discussion   . Palliative care by specialist   . Sepsis ([place])   . Hypotension   . Claudication of left lower extremity ([place])   . Elevated troponin 03/05/2016  . Pressure injury of skin, stage 2 11/27/2015  . H/O unilateral nephrectomy 07/06/2015  . Paroxysmal SVT (supraventricular tachycardia) ([place]) 06/22/2014  . Complications due to renal dialysis device, implant, and graft 05/11/2014  . Acute blood loss anemia 03/31/2014  . Renal dialysis device, implant, or graft complication 70/02/7492  . Acute hyperkalemia   . S/P liver transplant ([place])   . Chronic hepatitis C without hepatic coma ([place])   . Pulmonary edema 01/03/2014  . Respiratory failure ([place]) 01/03/2014  . Long term current use of anticoagulant therapy 04/22/2013  . Chronic anticoagulation w/ coumadin, goal INR 2.5-3.0 w/ AVR 04/14/2013  . Tobacco abuse 02/28/2013  . COPD (chronic obstructive pulmonary disease) ([place]) 02/21/2013  . Chronic combined systolic and diastolic congestive heart failure ([place]) 02/06/2013  . Acute respiratory failure ([place]) 11/02/2012  .  Acute pulmonary edema ([place]) 11/02/2012  . Acute on chronic systolic CHF (congestive heart failure) ([place]) 11/02/2012  . Dyslipidemia-LDL 104, not on statin with Hx of liver transplant 07/30/2012  . CAD (coronary artery disease), native coronary artery 07/27/2012  . History of prosthetic aortic valve replacement   . End stage renal disease on dialysis ([place]) 02/27/2011  . Hypothyroidism 06/25/2006  . Type 2 diabetes mellitus with chronic kidney disease ([place]) 06/25/2006  . GERD 06/25/2006  . Anemia due to chronic renal failure    . Hypertensive heart disease    Past Medical History:  Past Medical History:  Diagnosis Date  . Anemia    takes Folic Acid daily  . Anxiety   . Aortic stenosis   . Arthritis    "left hand, back" (08/30/2012)  . Asthma   . CAD (coronary artery disease)   . CAD (coronary artery disease) Jan. 2015   Cath: 20% LAD, 50% D1; s/p LIMA-LAD  . Chest pain 03/06/2015  . Chronic back pain   . Chronic bronchitis ([place])    "q yr w/season changes" (08/30/2012)  . Chronic constipation    takes MIralax and Colace daily  . COPD (chronic obstructive pulmonary disease) ([place])   . Depression    takes Cymbalta for "severe" depression  . End stage renal disease on dialysis [facility]) 02/27/2011   Kidneys shut down at time of liver transplant in Sept 2011 at [facility] in [place], she has been on HD ever since.  Dialyzes at [facility] HD on TTS schedule.  Had  L forearm graft used 10 months then removed Dec 2012 due to suspected infection.  A right upper arm AVG was placed Dec 2012 but she developed steal symptoms acutely and it was ligated the same day.  Never had an AV fistula due to small veins.  Now has L thigh AVG put in Jan 2013, has not clotted to date.    [name] GERD (gastroesophageal reflux disease)    takes Omeprazole daily  . Headache    "at least monthly" (08/30/2012)  . Hepatitis C   . History of blood transfusion    "several" (08/30/2012)  . Hypertension    takes Metoprolol and  Lisinopril daily, sees Dr Bea Graff  . Hypothyroidism    takes Synthroid daily  . Migraine    "last migraine was in 2013" (08/30/2012)  . Neuromuscular disorder ([place])    carpal tunnel in right hand  . Obesity   . Peripheral vascular disease (HCC) hands and legs  . Pneumonia    "today and several times before" (08/30/2012)  . 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 S/P unilateral BKA (below knee amputation), left ([place]) 04/30/2016  . SVT (supraventricular tachycardia) ([place]) 06/09/14  . Tobacco abuse   . Type II diabetes mellitus (HCC)    Levemir 2units daily if > 150   Past Surgical History:  Past Surgical History:  Procedure Laterality Date  . AMPUTATION Left 04/24/2016   Procedure: AMPUTATION BELOW KNEE;  Surgeon: Angelia Mould, MD;  Location: [facility];  Service: Vascular;  Laterality: Left;  . AORTIC VALVE REPLACEMENT N/A 03/15/2013   AVR; Surgeon: Ivin Poot, MD;  Location: [facility] OR; Open Heart Surgery;  12mm[employer] mechanical prosthesis, top hat valve  . ARTERIOVENOUS GRAFT PLACEMENT Left 10/03/10    forearm  . AV FISTULA PLACEMENT  01/29/2011   Procedure: INSERTION OF ARTERIOVENOUS (AV) GORE-TEX GRAFT ARM;  Surgeon: Elam Dutch, MD;  Location: [facility] OR;  Service: Vascular;  Laterality: Right;  . AV FISTULA PLACEMENT  03/10/2011   Procedure: INSERTION OF ARTERIOVENOUS (AV) GORE-TEX GRAFT THIGH;  Surgeon: Elam Dutch, MD;  Location: [facility];  Service: Vascular;  Laterality: Left;  . [place] REMOVAL  12/23/2010   Procedure: REMOVAL OF ARTERIOVENOUS GORETEX GRAFT ([place]);  Surgeon: Elam Dutch, MD;  Location: [place];  Service: Vascular;  Laterality: Left;  procedure started @1736 -1852  . CHOLECYSTECTOMY  1993  . CORONARY ARTERY BYPASS GRAFT N/A 03/15/2013   Procedure: CORONARY ARTERY BYPASS GRAFTING (CABG) times one using left internal mammary  artery.;  Surgeon: Ivin Poot, MD;  Location: [facility];  Service: Open Heart Surgery;  Laterality: N/A;  POSS CABG X 1  . CYSTOSCOPY  1990's  . INSERTION OF DIALYSIS CATHETER  12/23/2010   Procedure: INSERTION OF DIALYSIS CATHETER;  Surgeon: Elam Dutch, MD;  Location: [facility];  Service: Vascular;  Laterality: Right;  Right Internal Jugular 28cm dialysis catheter insertion procedure time 1701-1720   . INSERTION OF DIALYSIS CATHETER Right 03/11/2016   Procedure: INSERTION OF DIALYSIS CATHETER;  Surgeon: Angelia Mould, MD;  Location: [facility];  Service: Vascular;  Laterality: Right;  . INTRAOPERATIVE TRANSESOPHAGEAL ECHOCARDIOGRAM N/A 03/15/2013   Procedure: INTRAOPERATIVE TRANSESOPHAGEAL ECHOCARDIOGRAM;  Surgeon: Ivin Poot, MD;  Location: [facility];  Service: Open Heart Surgery;  Laterality: N/A;  . LEFT HEART  CATHETERIZATION WITH CORONARY ANGIOGRAM N/A 07/29/2012   Procedure: LEFT HEART CATHETERIZATION WITH CORONARY ANGIOGRAM;  Surgeon: Troy Sine, MD;  Location: [facility] CATH LAB;  Service: Cardiovascular;  Laterality: N/A;  . LEFT HEART CATHETERIZATION WITH CORONARY ANGIOGRAM N/A 03/10/2013   Procedure: LEFT HEART CATHETERIZATION WITH CORONARY ANGIOGRAM;  Surgeon: Troy Sine, MD;  Location: [facility] CATH LAB;  Service: Cardiovascular;  Laterality: N/A;  . LEFT HEART CATHETERIZATION WITH CORONARY/GRAFT ANGIOGRAM N/A 12/24/2011   Procedure: LEFT HEART CATHETERIZATION WITH Beatrix Fetters;  Surgeon: [name], MD;  Location[facility] CATH LAB;  Service: Cardiovascular;  Laterality: N/A;  . LEFT HEART CATHETERIZATION WITH CORONARY/GRAFT ANGIOGRAM N/A 12/16/2013   Procedure: LEFT HEART CATHETERIZATION WITH Beatrix Fetters;  Surgeon: Troy Sine, MD;  Location: [facility] CATH LAB;  Service: Cardiovascular;  Laterality: N/A;  . LIGATION ARTERIOVENOUS GORTEX GRAFT Left 03/11/2016   Procedure: LIGATION THIGH ARTERIOVENOUS GORTEX GRAFT;  Surgeon: Angelia Mould, MD;  Location: [facility];   Service: Vascular;  Laterality: Left;  . LIVER TRANSPLANT  10/25/2009   sees Dr Ferol Luz 1 every 6 months, saw last in Dec 2013. [name] Coord [phone]  . PERIPHERAL VASCULAR CATHETERIZATION N/A 11/06/2015   Procedure: Abdominal Aortogram;  Surgeon: Serafina Mitchell, MD;  Location: [facility] CV LAB;  Service: Cardiovascular;  Laterality: N/A;  . PERIPHERAL VASCULAR CATHETERIZATION N/A 11/06/2015   Procedure: Lower Extremity Angiography;  Surgeon: Serafina Mitchell, MD;  Location: [place] CV LAB;  Service: Cardiovascular;  Laterality: N/A;  . PERIPHERAL VASCULAR CATHETERIZATION  11/06/2015   Procedure: Peripheral Vascular Atherectomy;  Surgeon: Serafina Mitchell, MD;  Location: [place] CV LAB;  Service: Cardiovascular;;  Left Superficial femoral  . SHUNTOGRAM Left 05/15/2014   Procedure: SHUNTOGRAM;  Surgeon: Conrad [place], MD;  Location: [facility] CATH LAB;  Service: Cardiovascular;  Laterality: Left;  . SMALL INTESTINE SURGERY  90's  . SPINAL GROWTH RODS  2010   "put 2 metal rods in my back; they had detetriorated" (08/30/2012)  . THROMBECTOMY    . THROMBECTOMY AND REVISION OF ARTERIOVENTOUS (AV) GORETEX  GRAFT Left 03/30/2014  . THROMBECTOMY AND REVISION OF ARTERIOVENTOUS (AV) GORETEX  GRAFT Left 03/30/2014   Procedure: THROMBECTOMY AND REVISION OF ARTERIOVENTOUS (AV) GORETEX  GRAFT;  Surgeon: Conrad [place], MD;  Location: [facility];  Service: Vascular;  Laterality: Left;  . TUBAL LIGATION  1990's   Social History:  reports that she has been smoking Cigarettes.  She has a 20.00 pack-year smoking history. She has never used smokeless tobacco. She reports that she does not drink alcohol or use drugs.  Family / Support Systems Marital Status: Divorced Patient Roles: Parent Children: [name] [phone]  Other Supports: [name] [phone] Anticipated Caregiver: Daughter's and neighbor Ability/Limitations of Caregiver: [name] goes to school 8:30-1:30pm Water quality scientist  taking FMLA for six weeks Neighbor will assist while daughter is in school Caregiver Availability: Other (Comment) (Working on 24 hr care plan) Family Dynamics: Close with two daughter's and friend's and neighbors. She relies upon them to get her through difficult times. She is struggling with her pain issues from her BKA  Social History Preferred language: English Religion: Christian Cultural Background: No issues Education: High School Read: Yes Write: Yes Employment Status: Disabled Freight forwarder Issues: No issues Guardian/Conservator: None-according to MD pt is capable of making her own decisions while here. Daughter wants to be involved while here.   Abuse/Neglect Physical Abuse: Denies Verbal Abuse: Denies Sexual Abuse: Denies Exploitation of patient/patient's resources: Denies Self-Neglect:  Denies  Emotional Status [name] affect, behavior adn adjustment status: Pt is motivated and wants to do well here, but is having pain issues and phathom pain which she is having difficulty with this. She becomes tearful discussing this issue. She was mod/i at home prior to this admission and wants to do as much as she can for hersle fby discharge. Recent Psychosocial Issues: other health issues-ESRD M-W-F and history of falling Pyschiatric History: history of depression-anxiety takes medications for this and feels they help. She would benefit from seeing neuro-psych while here for relaxation and will ask RT to see also Substance Abuse History: Tobacco aware needs to quit for her circulation and pain issues. Will discuss with her while here on rehab.   Patient / Family Perceptions, Expectations & Goals Pt/Family understanding of illness & functional limitations: Pt and daughter can explain her amputation and treatment plan. Daughter is encouraging to pt and reports to take each day at a time. They both have spoken with the MD and feel they understand the process. Premorbid pt/family  roles/activities: Mom, retiree, dialysis pt, neighbor, friend, etc Anticipated changes in roles/activities/participation: resume Pt/family expectations/goals: Pt states: " I want to have less pain then I will be able to do things."  [name] states: " I hope each day she has less pain, we will make sure she has the care she needs at home."  [employer]: Other (Comment) (HD-) Premorbid Home Care/DME Agencies: Other (Comment) (has DME and was getting [place]) Transportation available at discharge: Daughter's Resource referrals recommended: Neuropsychology, Support group (specify)  Discharge Planning Living Arrangements: Children Support Systems: Children, Other relatives, Friends/neighbors, Social worker community Type of Residence: Private residence Insurance Resources: [employer], Multimedia programmer (specify) Nurse, mental health) Financial Resources: SSD Financial Screen Referred: No Living Expenses: Education officer, community Management: Patient, Family Does the patient have any problems obtaining your medications?: No Home Management: Daughter does the home management Patient/Family Preliminary Plans: Return home with daughter-[name] who does goes to school 8:30-1:30 pm but will make arrangements for someone-neighbor to stay with her. [name] plans to help also. Will await team conference goals and develop a safe discharge plan. Social Work Anticipated Follow Up Needs: HH/OP, Support Group  Clinical Impression Pleasant female who is motivated but needs to get her pain managed to be able to participate fully in therapies. Daughter-[name] is here to observe Mom in therapies today and provide emotional support. Will await team evaluations and work on a safe discharge plan for her. Would benefit from seeing neuro-psych and RT while here, with history of depression and relaxation techniques.  Elease Hashimoto 05/01/2016, 10:07 AM

## 2016-05-01 NOTE — Progress Notes (Signed)
[name], MD Physician Signed Physical Medicine and Rehabilitation  Consult Note Date of Service: 04/25/2016 8:32 AM  Related encounter: Admission (Discharged) from 04/21/2016 in [place] All Collapse All   [] Hide copied text [] Hover for attribution information      Physical Medicine and Rehabilitation Consult  Reason for Consult: L-BKA due to ischemia Referring Physician: Dr. Scot Dock    HPI: [name] is a 61 y.o. female with history of ESRD--HD MWF, Hep C s/p liver transplant, CAD s/p CABG with AVR, chronic pain, PAD with chronic heel ulcer and ischemic toes. She has had progressive pain  and was in process of being set for BKA. She was admitted on 04/21/16 with excruciating pain and inability to stand. Chronic coumadin held with heparin bridge till INR normalized. She underwent lexiscan and was cleared for surgery by cardiology. She underwent L-BKA by Dr. Scot Dock on 04/24/16. Post op PT/OT evaluations ordered and CIR recommended in anticipation of rehab needs.    Review of Systems  Unable to perform ROS: Severity of pain  Musculoskeletal: Positive for myalgias.  Neurological: Positive for sensory change.         Past Medical History:  Diagnosis Date  . Anemia    takes Folic Acid daily  . Anxiety   . Aortic stenosis   . Arthritis    "left hand, back" (08/30/2012)  . Asthma   . CAD (coronary artery disease)   . CAD (coronary artery disease) Jan. 2015   Cath: 20% LAD, 50% D1; s/p LIMA-LAD  . Chest pain 03/06/2015  . Chronic back pain   . Chronic bronchitis ([place])    "q yr w/season changes" (08/30/2012)  . Chronic constipation    takes MIralax and Colace daily  . COPD (chronic obstructive pulmonary disease) ([place])   . Depression    takes Cymbalta for "severe" depression  . End stage renal disease on dialysis [facility]) 02/27/2011   Kidneys shut down at time of liver transplant in Sept 2011 at  [facility] in [place], she has been on HD ever since.  Dialyzes at [facility] HD on TTS schedule.  Had L forearm graft used 10 months then removed Dec 2012 due to suspected infection.  A right upper arm AVG was placed Dec 2012 but she developed steal symptoms acutely and it was ligated the same day.  Never had an AV fistula due to small veins.  Now has L thigh AVG put in Jan 2013, has not clotted to date.    [name] GERD (gastroesophageal reflux disease)    takes Omeprazole daily  . Headache    "at least monthly" (08/30/2012)  . Hepatitis C   . History of blood transfusion    "several" (08/30/2012)  . Hypertension    takes Metoprolol and Lisinopril daily, sees Dr Bea Graff  . Hypothyroidism    takes Synthroid daily  . Migraine    "last migraine was in 2013" (08/30/2012)  . Neuromuscular disorder ([place])    carpal tunnel in right hand  . Obesity   . Peripheral vascular disease (HCC) hands and legs  . Pneumonia    "today and several times before" (08/30/2012)  . 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 SVT (supraventricular tachycardia) ([place]) 06/09/14  . Tobacco abuse   .  Type II diabetes mellitus (HCC)    Levemir 2units daily if > 150         Past Surgical History:  Procedure Laterality Date  . AORTIC VALVE REPLACEMENT N/A 03/15/2013   AVR; Surgeon: Ivin Poot, MD;  Location: [facility] OR; Open Heart Surgery;  36mm[employer] mechanical prosthesis, top hat valve  . ARTERIOVENOUS GRAFT PLACEMENT Left 10/03/10    forearm  . AV FISTULA PLACEMENT  01/29/2011   Procedure: INSERTION OF ARTERIOVENOUS (AV) GORE-TEX GRAFT ARM;  Surgeon: Elam Dutch, MD;  Location: [facility] OR;  Service: Vascular;  Laterality: Right;  . AV FISTULA PLACEMENT  03/10/2011   Procedure: INSERTION OF ARTERIOVENOUS (AV) GORE-TEX GRAFT THIGH;  Surgeon: Elam Dutch, MD;   Location: [place];  Service: Vascular;  Laterality: Left;  . [place] REMOVAL  12/23/2010   Procedure: REMOVAL OF ARTERIOVENOUS GORETEX GRAFT ([place]);  Surgeon: Elam Dutch, MD;  Location: [place];  Service: Vascular;  Laterality: Left;  procedure started @1736 -7741  . CHOLECYSTECTOMY  1993  . CORONARY ARTERY BYPASS GRAFT N/A 03/15/2013   Procedure: CORONARY ARTERY BYPASS GRAFTING (CABG) times one using left internal mammary artery.;  Surgeon: Ivin Poot, MD;  Location: [place];  Service: Open Heart Surgery;  Laterality: N/A;  POSS CABG X 1  . CYSTOSCOPY  1990's  . INSERTION OF DIALYSIS CATHETER  12/23/2010   Procedure: INSERTION OF DIALYSIS CATHETER;  Surgeon: Elam Dutch, MD;  Location: [place];  Service: Vascular;  Laterality: Right;  Right Internal Jugular 28cm dialysis catheter insertion procedure time 1701-1720   . INSERTION OF DIALYSIS CATHETER Right 03/11/2016   Procedure: INSERTION OF DIALYSIS CATHETER;  Surgeon: Angelia Mould, MD;  Location: [place];  Service: Vascular;  Laterality: Right;  . INTRAOPERATIVE TRANSESOPHAGEAL ECHOCARDIOGRAM N/A 03/15/2013   Procedure: INTRAOPERATIVE TRANSESOPHAGEAL ECHOCARDIOGRAM;  Surgeon: Ivin Poot, MD;  Location: [place];  Service: Open Heart Surgery;  Laterality: N/A;  . LEFT HEART CATHETERIZATION WITH CORONARY ANGIOGRAM N/A 07/29/2012   Procedure: LEFT HEART CATHETERIZATION WITH CORONARY ANGIOGRAM;  Surgeon: Troy Sine, MD;  Location: [facility] CATH LAB;  Service: Cardiovascular;  Laterality: N/A;  . LEFT HEART CATHETERIZATION WITH CORONARY ANGIOGRAM N/A 03/10/2013   Procedure: LEFT HEART CATHETERIZATION WITH CORONARY ANGIOGRAM;  Surgeon: Troy Sine, MD;  Location: [facility] CATH LAB;  Service: Cardiovascular;  Laterality: N/A;  . LEFT HEART CATHETERIZATION WITH CORONARY/GRAFT ANGIOGRAM N/A 12/24/2011   Procedure: LEFT HEART CATHETERIZATION WITH Beatrix Fetters;  Surgeon: [name], MD;  Location: [facility] CATH LAB;  Service:  Cardiovascular;  Laterality: N/A;  . LEFT HEART CATHETERIZATION WITH CORONARY/GRAFT ANGIOGRAM N/A 12/16/2013   Procedure: LEFT HEART CATHETERIZATION WITH Beatrix Fetters;  Surgeon: Troy Sine, MD;  Location: [facility] CATH LAB;  Service: Cardiovascular;  Laterality: N/A;  . LIGATION ARTERIOVENOUS GORTEX GRAFT Left 03/11/2016   Procedure: LIGATION THIGH ARTERIOVENOUS GORTEX GRAFT;  Surgeon: Angelia Mould, MD;  Location: [place];  Service: Vascular;  Laterality: Left;  . LIVER TRANSPLANT  10/25/2009   sees Dr Ferol Luz 1 every 6 months, saw last in Dec 2013. [name] Coord [phone]  . PERIPHERAL VASCULAR CATHETERIZATION N/A 11/06/2015   Procedure: Abdominal Aortogram;  Surgeon: Serafina Mitchell, MD;  Location: [place] CV LAB;  Service: Cardiovascular;  Laterality: N/A;  . PERIPHERAL VASCULAR CATHETERIZATION N/A 11/06/2015   Procedure: Lower Extremity Angiography;  Surgeon: Serafina Mitchell, MD;  Location: [place] CV LAB;  Service: Cardiovascular;  Laterality: N/A;  . PERIPHERAL VASCULAR CATHETERIZATION  11/06/2015   Procedure: Peripheral Vascular Atherectomy;  Surgeon: Serafina Mitchell, MD;  Location: [place] CV LAB;  Service: Cardiovascular;;  Left Superficial femoral  . SHUNTOGRAM Left 05/15/2014   Procedure: SHUNTOGRAM;  Surgeon: Conrad Lakefield, MD;  Location: [facility] CATH LAB;  Service: Cardiovascular;  Laterality: Left;  . SMALL INTESTINE SURGERY  90's  . SPINAL GROWTH RODS  2010   "put 2 metal rods in my back; they had detetriorated" (08/30/2012)  . THROMBECTOMY    . THROMBECTOMY AND REVISION OF ARTERIOVENTOUS (AV) GORETEX  GRAFT Left 03/30/2014  . THROMBECTOMY AND REVISION OF ARTERIOVENTOUS (AV) GORETEX  GRAFT Left 03/30/2014   Procedure: THROMBECTOMY AND REVISION OF ARTERIOVENTOUS (AV) GORETEX  GRAFT;  Surgeon: Conrad Crab Orchard, MD;  Location: [place];  Service: Vascular;  Laterality: Left;  . TUBAL LIGATION  1990's         Family History  Problem Relation Age  of Onset  . Cancer Mother   . Diabetes Mother   . Hypertension Mother   . Stroke Mother   . Cancer Father   . Anesthesia problems Neg Hx   . Hypotension Neg Hx   . Malignant hyperthermia Neg Hx   . Pseudochol deficiency Neg Hx     Social History:  Lives with daughter who goes to school 2-3 times a week. Daughter can assist as needed. Per reports that she has been smoking Cigarettes--1/2 PPD.  She has a 20.00 pack-year smoking history. She has never used smokeless tobacco. Per  reports that she does not drink alcohol  or use drugs.        Allergies  Allergen Reactions  . Acetaminophen Other (See Comments)    Liver transplant recipient   . Morphine Itching and Other (See Comments)    hallucinate  . Codeine Itching  . Mirtazapine Other (See Comments)    hallucination          Medications Prior to Admission  Medication Sig Dispense Refill  . aspirin EC 81 MG tablet Take 81 mg by mouth daily.    [name] gabapentin (NEURONTIN) 300 MG capsule Take 300 mg by mouth every evening.    [name] HYDROmorphone (DILAUDID) 2 MG tablet Take 2 mg by mouth.    . Insulin Detemir (LEVEMIR FLEXPEN) 100 UNIT/ML Pen Inject 2-6 Units into the skin See admin instructions. Only if cbg is over 200    . levothyroxine (SYNTHROID, LEVOTHROID) 175 MCG tablet TAKE 1 TABLET (175 MCG TOTAL) BY MOUTH DAILY BEFORE BREAKFAST.  6  . metoprolol tartrate (LOPRESSOR) 25 MG tablet Take 25 mg by mouth 2 (two) times daily.    . nitroGLYCERIN (NITROSTAT) 0.4 MG SL tablet Place 1 tablet (0.4 mg total) under the tongue every 5 (five) minutes as needed for chest pain. 30 tablet 0  . ondansetron (ZOFRAN ODT) 4 MG disintegrating tablet Take 1 tablet (4 mg total) by mouth every 8 (eight) hours as needed for nausea. 6 tablet 0  . senna-docusate (SENOKOT-S) 8.6-50 MG tablet Take 1 tablet by mouth daily.    . tacrolimus (PROGRAF) 1 MG capsule Take 2 capsules (2 mg total) by mouth 2 (two) times daily. Take 2  capsules twice daily. 120 capsule 0  . calcium acetate (PHOSLO) 667 MG capsule Take 1 capsule (667 mg total) by mouth 3 (three) times daily with meals. (Patient not taking: Reported on 04/23/2016) 30 capsule 1  . gabapentin (NEURONTIN) 100 MG capsule Take 100 mg by mouth as directed. 1 capsule on dialysis days    .  warfarin (COUMADIN) 2 MG tablet Take 1- 2 tablets by mouth daily or as directed by coumadin clinic (Patient taking differently: Take 2 mg by mouth daily at 6 PM. Take as directed by coumadin clinic) 45 tablet 3    Home: [place] expects to be discharged to:: Private residence Living Arrangements: Children  Functional History: Functional Status:  Mobility:  ADL:  Cognition: Cognition Orientation Level: Oriented to person, Oriented to time, Oriented to situation  Blood pressure (!) 162/63, pulse 72, temperature 98.5 F (36.9 C), temperature source Oral, resp. rate 16, height 5' 4.5" (1.638 m), weight 57.4 kg (126 lb 8.7 oz), SpO2 100 %. Physical Exam  Nursing note and vitals reviewed. Constitutional: She is oriented to person, place, and time. She appears well-developed. She appears cachectic. She has a sickly appearance.  Seen during HD--moaning and crying about pain right foot/heel as well as left groin  HENT:  Head: Atraumatic.  Mouth/Throat: Oropharynx is clear and moist.  edentulous  Eyes: Conjunctivae are normal. Pupils are equal, round, and reactive to light.  Neck: Normal range of motion. Neck supple.  Cardiovascular: Normal rate and regular rhythm.   Respiratory: Effort normal and breath sounds normal. No stridor. No respiratory distress.  GI: Soft. Bowel sounds are normal. She exhibits no distension. There is no tenderness.  Musculoskeletal:  L-BKA with compressive dressing--blood draining through the ACE wrap. Kept right knee flexed upright due to pain--spasms with extension. Right forefoot and heel dusky and ischemic appearing. Warm to  touch with faint pulse. Heel with mild bogginess.   Neurological: She is alert and oriented to person, place, and time.  Skin: Skin is warm and dry. She is not diaphoretic.  Psychiatric:  Anxious and tearful due to pain. Can be redirected at times.         Assessment/Plan: Diagnosis: PAD s/p left BKA 1. Does the need for close, 24 hr/day medical supervision in concert with the patient's rehab needs make it unreasonable for this patient to be served in a less intensive setting? Yes 2. Co-Morbidities requiring supervision/potential complications: pain, PAD RLE, ESRD on HD 3. Due to bladder management, bowel management, safety, skin/wound care, disease management, medication administration, pain management and patient education, does the patient require 24 hr/day rehab nursing? Yes 4. Does the patient require coordinated care of a physician, rehab nurse, PT (1-2 hrs/day, 5 days/week) and OT (1-2 hrs/day, 5 days/week) to address physical and functional deficits in the context of the above medical diagnosis(es)? Yes Addressing deficits in the following areas: balance, endurance, locomotion, strength, transferring, bowel/bladder control, bathing, dressing, feeding, grooming, toileting and psychosocial support 5. Can the patient actively participate in an intensive therapy program of at least 3 hrs of therapy per day at least 5 days per week? Yes 6. The potential for patient to make measurable gains while on inpatient rehab is excellent 7. Anticipated functional outcomes upon discharge from inpatient rehab are supervision and min assist  with PT, supervision and min assist with OT, n/a with SLP. 8. Estimated rehab length of stay to reach the above functional goals is: potentially 11-15 days 9. Does the patient have adequate social supports and living environment to accommodate these discharge functional goals? Yes and Potentially 10. Anticipated D/C setting: Home 11. Anticipated post D/C treatments:  HH therapy and Outpatient therapy 12. Overall Rehab/Functional Prognosis: excellent  RECOMMENDATIONS: This patient's condition is appropriate for continued rehabilitative care in the following setting: CIR Patient has agreed to participate in recommended program. Yes Note  that insurance prior authorization may be required for reimbursement for recommended care.  Comment: Therapy evals pending. Rehab Admissions Coordinator to follow up.  Thanks,  [name], MD, Tilford Pillar, PA-C 04/24/2016    Revision History

## 2016-05-01 NOTE — Progress Notes (Signed)
Initial Nutrition Assessment  DOCUMENTATION CODES:   Non-severe (moderate) malnutrition in context of chronic illness  INTERVENTION:  Provide Boost Breeze po BID, each supplement provides 250 kcal and 9 grams of protein.  Continue 30 ml Prostat po BID, each supplement provides 100 kcal and 15 grams of protein.   Encourage adequate PO intake.   NUTRITION DIAGNOSIS:   Malnutrition related to chronic illness as evidenced by moderate depletion of body fat, moderate depletions of muscle mass.  GOAL:   Patient will meet greater than or equal to 90% of their needs  MONITOR:   PO intake, Supplement acceptance, Labs, Weight trends, Skin, I & O's  REASON FOR ASSESSMENT:   Malnutrition Screening Tool    ASSESSMENT:   61 y.o. female with history of ESRD--HD MWF, Hep C s/p liver transplant, CAD s/p CABG with AVR, chronic pain, PAD with chronic heel ulcer and ischemic toes. She has had progressive pain  and was in process of being set for BKA. She was admitted on 04/21/16 with excruciating pain and inability to stand. She underwent L-BKA by Dr. Scot Dock on 04/24/16.   Pt reports having a decreased appetite. Meal completion has been varied from 10-100% with 10% at lunch today. Daughter at bedside. She reports pt has been consuming at least 2-3 meals a day that usually contain a protein, vegetable, and starch with Boost Breeze or Ensure Clear at least 3 times daily. Usual body weight reported to be ~130 lbs which pt reports last  Weighing 1 month ago. Pt with a 8.5% weight loss in 1 month. Weight loss may be related to fluid status and recent BKA. Pt currently has Prostat ordered and has been consuming them. RD to additionally order Boost Breeze to aid in adequate nutrition.   Nutrition-Focused physical exam completed. Findings are moderate fat depletion, moderate muscle depletion, and mild edema.   Labs and mediations reviewed.   Diet Order:  Diet regular Room service appropriate? Yes; Fluid  consistency: Thin; Fluid restriction: 1200 mL Fluid  Skin:  Wound (see comment) (Stg 2 coccyx, stage I L hip, incision on L leg)  Last BM:  3/21  Height:   Ht Readings from Last 1 Encounters:  04/30/16 5\' 4"  (1.626 m)    Weight:   Wt Readings from Last 1 Encounters:  05/01/16 119 lb 0.8 oz (54 kg)    Ideal Body Weight:  51.3 kg (adjusted for L BKA)  BMI:  Body mass index is 20.43 kg/m.  Estimated Nutritional Needs:   Kcal:  1700-1900  Protein:  75-90 grams  Fluid:  1.2 L/day  EDUCATION NEEDS:   No education needs identified at this time  Corrin Parker, MS, RD, LDN Pager # [phone] After hours/ weekend pager # [phone]

## 2016-05-01 NOTE — Progress Notes (Signed)
Retta Diones, RN Rehab Admission Coordinator Signed Physical Medicine and Rehabilitation  PMR Pre-admission Date of Service: 04/30/2016 10:35 AM  Related encounter: Admission (Discharged) from 04/21/2016 in [place]       [] Hide copied text PMR Admission Coordinator Pre-Admission Assessment  Patient: [name] is an 61 y.o., female MRN: [id number] DOB: 1955/11/12 Height: 5' 4.5" (163.8 cm) (from 04/17/16) Weight: 55.7 kg (122 lb 12.7 oz)                                                                                                                                                  Insurance Information HMO: No   PPO:       PCP:       IPA:       80/20:       OTHER:   PRIMARY:  Medicare A/B      Policy#: [id number]      Subscriber: [name] CM Name:        Phone#:       Fax#:   Pre-Cert#:        Employer:  Disabled Benefits:  Phone #:       Name: Checked in [place]. Date:  04/11/11     Deduct: $1340      Out of Pocket Max: none      Life Max: unlimited CIR: 100%      SNF: 100 days Outpatient: 80%     Co-Pay: 20% Home Health: 100%      Co-Pay: none DME: 80%     Co-Pay: 20% Providers: patient's choice  SECONDARY:  BCBS supplement      Policy#: [id number]      Subscriber:  [name] CM Name:        Phone#:       Fax#:   Pre-Cert#:        Employer:  Disabled Benefits:  Phone #:  [phone]     Name:   Eff. Date:       Deduct:        Out of Pocket Max:        Life Max:   CIR:        SNF:   Outpatient:       Co-Pay:   Home Health:        Co-Pay:   DME:       Co-Pay:    Medicaid Application Date:        Case Manager:   Disability Application Date:        Case Worker:    Emergency Contact Information        Contact Information    Name Relation Home Work Mobile   Walls Daughter   [phone]   Noralee Space Daughter [phone]      Current Medical History  Patient  Admitting Diagnosis:   L BKA  History of Present Illness: A 61 y.o.femalewith history of ESRD--HD MWF, Hep C s/p liver transplant, CAD s/p CABG with AVR, chronic pain, PAD with chronic heel ulcer and ischemic toes. She has had progressive pain and was in process of being set for BKA. She was admitted on 04/21/16 with excruciating pain and inability to stand. Chronic coumadin held with heparin bridge till INR normalized. She underwent lexiscan and was cleared for surgery by cardiology. She underwent L-BKA by Dr. Scot Dock on 04/24/16. Continues on IV heparin as INR subtherapeutic. Noted to have drop in H/H to 8.0/23.8 and to be transfused with 2 units PRBC. She continues to be limited by lethargy, intermittent confusion and pain affecting ADL tasks and mobility. CIR recommended for follow up therapy.    Past Medical History      Past Medical History:  Diagnosis Date  . Anemia    takes Folic Acid daily  . Anxiety   . Aortic stenosis   . Arthritis    "left hand, back" (08/30/2012)  . Asthma   . CAD (coronary artery disease)   . CAD (coronary artery disease) Jan. 2015   Cath: 20% LAD, 50% D1; s/p LIMA-LAD  . Chest pain 03/06/2015  . Chronic back pain   . Chronic bronchitis ([place])    "q yr w/season changes" (08/30/2012)  . Chronic constipation    takes MIralax and Colace daily  . COPD (chronic obstructive pulmonary disease) ([place])   . Depression    takes Cymbalta for "severe" depression  . End stage renal disease on dialysis [facility]) 02/27/2011   Kidneys shut down at time of liver transplant in Sept 2011 at [facility] in [place], she has been on HD ever since.  Dialyzes at [facility] HD on TTS schedule.  Had L forearm graft used 10 months then removed Dec 2012 due to suspected infection.  A right upper arm AVG was placed Dec 2012 but she developed steal symptoms acutely and it was ligated the same day.  Never had an AV fistula due to small veins.  Now has L thigh AVG put in Jan 2013, has not clotted to date.     [name] GERD (gastroesophageal reflux disease)    takes Omeprazole daily  . Headache    "at least monthly" (08/30/2012)  . Hepatitis C   . History of blood transfusion    "several" (08/30/2012)  . Hypertension    takes Metoprolol and Lisinopril daily, sees Dr Bea Graff  . Hypothyroidism    takes Synthroid daily  . Migraine    "last migraine was in 2013" (08/30/2012)  . Neuromuscular disorder ([place])    carpal tunnel in right hand  . Obesity   . Peripheral vascular disease (HCC) hands and legs  . Pneumonia    "today and several times before" (08/30/2012)  . 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 SVT (supraventricular tachycardia) ([place]) 06/09/14  . Tobacco abuse   . Type II diabetes mellitus (HCC)    Levemir 2units daily if > 150   Family History  family history includes Cancer in her father and mother; Diabetes in her mother; Hypertension in her mother; Stroke in her mother.  Prior Rehab/Hospitalizations: Had [facility] PT/OT recently  Has the patient had major surgery during 100 days prior to admission? No  Current Medications   Current Facility-Administered Medications:  .  0.9 %  sodium chloride infusion, , Intravenous, Once, [name] Dumas, MD .  alum & mag hydroxide-simeth (MAALOX/MYLANTA) 200-200-20 MG/5ML suspension 15-30 mL, 15-30 mL, Oral, Q2H PRN, Alvia Grove, PA-C .  aspirin EC tablet 81 mg, 81 mg, Oral, Daily, Edwin Dada, MD, 81 mg at 04/29/16 1026 .  calcitRIOL (ROCALTROL) capsule 0.5 mcg, 0.5 mcg, Oral, Q M,W,F-HD, Thomos Lemons Ejigiri, PA-C, 0.5 mcg at 04/28/16 0946 .  calcium acetate (PHOSLO) capsule 667 mg, 667 mg, Oral, TID WC, Edwin Dada, MD, 667 mg at 04/29/16 1724 .  Darbepoetin Alfa (ARANESP) injection 100 mcg, 100 mcg, Intravenous, Q Mon-HD, Alric Seton, PA-C, 100 mcg at 04/28/16  0943 .  famotidine (PEPCID) tablet 20 mg, 20 mg, Oral, BID PRN, Gardiner Barefoot, NP, 20 mg at 04/23/16 2105 .  feeding supplement (BOOST / RESOURCE BREEZE) liquid 1 Container, 1 Container, Oral, TID BM, Jamal Maes, MD, 1 Container at 04/29/16 2009 .  feeding supplement (PRO-STAT SUGAR FREE 64) liquid 30 mL, 30 mL, Oral, BID, Thomos Lemons Ejigiri, PA-C, 30 mL at 04/29/16 2213 .  fentaNYL (SUBLIMAZE) injection 25-50 mcg, 25-50 mcg, Intravenous, Q5 min PRN, Roderic Palau, MD, 50 mcg at 04/30/16 0009 .  gabapentin (NEURONTIN) capsule 100 mg, 100 mg, Oral, Q M,W,F-HD, Edwin Dada, MD, 100 mg at 04/28/16 1237 .  gabapentin (NEURONTIN) capsule 300 mg, 300 mg, Oral, QHS, Edwin Dada, MD, 300 mg at 04/29/16 2216 .  guaiFENesin-dextromethorphan (ROBITUSSIN DM) 100-10 MG/5ML syrup 15 mL, 15 mL, Oral, Q4H PRN, Janalyn Harder Trinh, PA-C .  hydrALAZINE (APRESOLINE) injection 10-20 mg, 10-20 mg, Intravenous, Q2H PRN, Patrecia Pour, MD, 10 mg at 04/23/16 0426 .  insulin aspart (novoLOG) injection 0-5 Units, 0-5 Units, Subcutaneous, QHS, Christopher P Danford, MD .  insulin aspart (novoLOG) injection 0-9 Units, 0-9 Units, Subcutaneous, TID WC, Edwin Dada, MD, 1 Units at 04/29/16 1725 .  ketorolac (TORADOL) 15 MG/ML injection 15 mg, 15 mg, Intravenous, Q6H PRN, [name] Dumas, MD, 15 mg at 04/30/16 0837 .  labetalol (NORMODYNE,TRANDATE) injection 10 mg, 10 mg, Intravenous, Q10 min PRN, Alvia Grove, PA-C .  levothyroxine (SYNTHROID, LEVOTHROID) tablet 175 mcg, 175 mcg, Oral, QAC breakfast, Edwin Dada, MD, 175 mcg at 04/29/16 [phone] .  magnesium sulfate IVPB 2 g 50 mL, 2 g, Intravenous, Daily PRN, Alvia Grove, PA-C .  metoprolol (LOPRESSOR) injection 2-5 mg, 2-5 mg, Intravenous, Q2H PRN, Alvia Grove, PA-C .  metoprolol tartrate (LOPRESSOR) tablet 25 mg, 25 mg, Oral, BID, Edwin Dada, MD, 25 mg at 04/29/16 2217 .  multivitamin (RENA-VIT) tablet 1  tablet, 1 tablet, Oral, QHS, [name], PA-C, 1 tablet at 04/29/16 2217 .  ondansetron (ZOFRAN) tablet 4 mg, 4 mg, Oral, Q6H PRN **OR** ondansetron (ZOFRAN) injection 4 mg, 4 mg, Intravenous, Q6H PRN, Edwin Dada, MD, 4 mg at 04/24/16 1738 .  oxyCODONE (Oxy IR/ROXICODONE) immediate release tablet 5 mg, 5 mg, Oral, Q4H PRN, Edwin Dada, MD, 5 mg at 04/30/16 0940 .  pantoprazole (PROTONIX) EC tablet 40 mg, 40 mg, Oral, Daily, Kimberly A Trinh, PA-C, 40 mg at 04/29/16 1029 .  phenol (CHLORASEPTIC) mouth spray 1 spray, 1 spray, Mouth/Throat, PRN, Janalyn Harder Trinh, PA-C .  polyethylene glycol (MIRALAX / GLYCOLAX) packet 17 g, 17 g, Oral, Daily PRN, Gardiner Barefoot, NP, 17 g at 04/23/16 1215 .  senna-docusate (Senokot-S) tablet 2 tablet, 2 tablet,  Oral, QHS, Gardiner Barefoot, NP, 2 tablet at 04/29/16 2215 .  senna-docusate (Senokot-S) tablet 2 tablet, 2 tablet, Oral, QHS PRN, Gardiner Barefoot, NP .  tacrolimus (PROGRAF) capsule 2 mg, 2 mg, Oral, BID, Edwin Dada, MD, 2 mg at 04/29/16 2217 .  traMADol (ULTRAM) tablet 50 mg, 50 mg, Oral, Q6H PRN, [name] Dumas, MD, 50 mg at 04/28/16 1554 .  warfarin (COUMADIN) tablet 4 mg, 4 mg, Oral, ONCE-1800, [name] Dumas, MD .  Warfarin - Pharmacist Dosing Inpatient, , Does not apply, q1800, Delane Ginger, RPH  Patients Current Diet: Diet regular Room service appropriate? Yes; Fluid consistency: Thin; Fluid restriction: 1200 mL Fluid Diet - low sodium heart healthy  Precautions / Restrictions Precautions Precautions: Fall Restrictions Weight Bearing Restrictions: Yes LLE Weight Bearing: Non weight bearing   Has the patient had 2 or more falls or a fall with injury in the past year?Yes.  Reports at least 5 falls.  Prior Activity Level Community (5-7x/wk): Went out 3-4 X a week to HD and to church.  Home Assistive Devices / Equipment Home Assistive Devices/Equipment: Environmental consultant (specify type),  Eyeglasses, Dentures (specify type) Home Equipment: Bedside commode, Walker - 2 wheels  Prior Device Use: Indicate devices/aids used by the patient prior to current illness, exacerbation or injury? Manual wheelchair, Walker and Seated walker.  Did not use any device PTA.  Prior Functional Level Prior Function Level of Independence: Independent Comments: Pt reports that her daughter has been helpign her more lately due to LE pain  Self Care: Did the patient need help bathing, dressing, using the toilet or eating?  Needed some help  Indoor Mobility: Did the patient need assistance with walking from room to room (with or without device)? Needed some help  Stairs: Did the patient need assistance with internal or external stairs (with or without device)? Dependent  Functional Cognition: Did the patient need help planning regular tasks such as shopping or remembering to take medications? Independent  Current Functional Level Cognition  Overall Cognitive Status: Within Functional Limits for tasks assessed Orientation Level: Oriented X4 Following Commands: Follows one step commands inconsistently, Follows one step commands with increased time    Extremity Assessment (includes Sensation/Coordination)  Upper Extremity Assessment: Overall WFL for tasks assessed  Lower Extremity Assessment: Overall WFL for tasks assessed RLE Deficits / Details: WFL, with mild generalized weakness LLE Deficits / Details: increased pain, aarom to full flexion and -2 to -3 * extension    ADLs  Overall ADL's : Needs assistance/impaired Eating/Feeding: Supervision/ safety, Set up, Sitting Eating/Feeding Details (indicate cue type and reason): Maintained good posture at EOB to eat Grooming: Set up, Sitting Upper Body Bathing: Minimal assistance, Sitting Lower Body Bathing: Minimal assistance, Sit to/from stand Upper Body Dressing : Minimal assistance, Sitting Lower Body Dressing: Minimal assistance,  Sit to/from stand Lower Body Dressing Details (indicate cue type and reason): Pt donned R sock with supervision General ADL Comments: Pt unable to partciaption in grooming task due to lethargic behavior and decreased orientation    Mobility  Overal bed mobility: Needs Assistance Bed Mobility: Rolling, Sidelying to Sit Rolling: Min guard Sidelying to sit: Min guard (use of the rail) Supine to sit: Mod assist Sit to supine: Min assist General bed mobility comments: use of rail to roll and help push up, no assist needed    Transfers  Overall transfer level: Needs assistance Equipment used: Rolling walker (2 wheeled) Transfers: Sit to/from Stand Sit to Stand: Mod assist, +2 safety/equipment General  transfer comment: Cues for hand placement and assist to come both forward and up.    Ambulation / Gait / Stairs / Wheelchair Mobility  Ambulation/Gait Ambulation/Gait assistance: Museum/gallery curator (Feet): 5 Feet (and additional 7 feet) Assistive device: Rolling walker (2 wheeled) Gait Pattern/deviations: Step-to pattern General Gait Details: worked of technique for standing, safe position in the RW.  Swing to pattern.  Pt can not keep her UE's locked at the elbow for long and therefore does not have a confident swing to.    Posture / Balance Balance Overall balance assessment: Needs assistance Sitting-balance support: Bilateral upper extremity supported, Feet supported Sitting balance-Leahy Scale: Fair Standing balance support: Bilateral upper extremity supported, During functional activity Standing balance-Leahy Scale: Poor Standing balance comment: pt still having trouble maintaining balance without external support    Special needs/care consideration BiPAP/CPAP No.  Daughter says that patient needs to re-do her steep study. CPM No Continuous Drip IV No Dialysis Yes        Days M-W-F Life Vest No Oxygen No Special Bed No Trach Size No Wound Vac (area) No        Skin Has new L BKA incision and a sore spot on right foot.  Gets bedsores easily per daughter      Bowel mgmt: Last BM per daughter was 04/29/16 Bladder mgmt: Anuraia Diabetic mgmt Yes, using very little insulin    Previous Home Environment Living Arrangements: Children Available Help at Discharge: Family, Available PRN/intermittently (daughter works during which pt in alone.) Type of Home: Mobile home Home Layout: One level Home Access: Stairs to enter Entrance Stairs-Rails: Right, Left, Can reach both Entrance Stairs-Number of Steps: 5 Bathroom Shower/Tub: Chiropodist: Handicapped height Bathroom Accessibility: No Home Care Services: No Additional Comments: pt thinks a w/c will be difficult to maneuver in her mobilie home.  Discharge Living Setting Plans for Discharge Living Setting: Lives with (comment), Mobile Home (Lives with daughter, [name].) Type of Home at Discharge: Mobile home (Single wide, extended length mobile home.) Discharge Home Layout: One level Discharge Home Access: Stairs to enter, Ramped entrance Entrance Stairs-Number of Steps: 4 steps, but having ramp built. Does the patient have any problems obtaining your medications?: No  Social/Family/Support Systems Patient Roles: Parent (Has 2 daughters.) Contact Information: Jayme Cloud - daughter.  Note that [name] has medical power of attorney and should be first contact for all decisions. Anticipated Caregiver: Daughter Anticipated Caregiver's Contact Information: [name] - dtr [phone] Ability/Limitations of Caregiver: Dtr goes to school 830 to 130, but plans to have neighbor stay with patient when she is gone.  Another daughter is taking FMLA for 6 weeks.  [name] says  Healther is not much help. Caregiver Availability: Other (Comment) (Daughter aware of need for supervision and assist at home.) Discharge Plan Discussed with Primary Caregiver: Yes Is Caregiver In Agreement  with Plan?: Yes Does Caregiver/Family have Issues with Lodging/Transportation while Pt is in Rehab?: No  Goals/Additional Needs Patient/Family Goal for Rehab: PT/OT supervision to min assist goals Expected length of stay: 11-15 days Cultural Considerations: Attends [employer]. Dietary Needs: Regular diet, thin liquids Equipment Needs: TBD Special Service Needs: Goes to HD M-W-F Additional Information: Daughter is concerned and feels patient needs to be seen by psychiatrist/psychologist.  Patient is ? depressed, ?having adjustment issues, ? has phantom limb pain and needs to talk to someone.  Consider referring to neurospychologist. Pt/Family Agrees to Admission and willing to participate: Yes (I spoke to both daughters  by phone.) Program Orientation Provided & Reviewed with Pt/Caregiver Including Roles  & Responsibilities: Yes  Decrease burden of Care through IP rehab admission:  N/A  Possible need for SNF placement upon discharge: Not anticipated  Patient Condition: This patient's medical and functional status has changed since the consult dated: 04/25/16 in which the Rehabilitation Physician determined and documented that the patient's condition is appropriate for intensive rehabilitative care in an inpatient rehabilitation facility. See "History of Present Illness" (above) for medical update. Functional changes are:  Currently requiring mod assist for transfers and min assist to ambulate 5 feet RW. Patient's medical and functional status update has been discussed with the Rehabilitation physician and patient remains appropriate for inpatient rehabilitation. Will admit to inpatient rehab today.  Preadmission Screen Completed By:  Retta Diones, 04/30/2016 11:07 AM ______________________________________________________________________   Discussed status with Dr. Posey Pronto on 04/30/16 at 80 and received telephone approval for admission today.  Admission Coordinator:  Retta Diones, time 1102/Date 04/30/16       Cosigned by: Ankit [name], MD at 04/30/2016 11:10 AM  Revision History

## 2016-05-01 NOTE — Progress Notes (Signed)
[facility] PHYSICAL MEDICINE & REHABILITATION     PROGRESS NOTE  Subjective/Complaints:  Pt seen laying in bed this AM.  She slept fairly overnight, but is ready to begin therapies.   ROS: Denies CP, SOB, N/V/D.  Objective: Vital Signs: Blood pressure 122/68, pulse 79, temperature 98.7 F (37.1 C), temperature source Oral, resp. rate 18, height 5\' 4"  (1.626 m), weight 54 kg (119 lb 0.8 oz), SpO2 99 %. No results found.  Recent Labs  04/29/16 0527 04/30/16 0709  WBC 15.3* 13.9*  HGB 8.3* 8.0*  HCT 24.8* 23.8*  PLT 193 184    Recent Labs  04/30/16 0709  NA 132*  K 4.1  CL 99*  GLUCOSE 120*  BUN 26*  CREATININE 5.35*  CALCIUM 8.2*   CBG (last 3)   Recent Labs  04/30/16 1902 04/30/16 2143 05/01/16 0700  GLUCAP 131* 142* 97    Wt Readings from Last 3 Encounters:  05/01/16 54 kg (119 lb 0.8 oz)  04/30/16 54.7 kg (120 lb 9.5 oz)  04/17/16 58.7 kg (129 lb 6.4 oz)    Physical Exam:  BP 122/68 (BP Location: Left Arm)   Pulse 79   Temp 98.7 F (37.1 C) (Oral)   Resp 18   Ht 5\' 4"  (1.626 m)   Wt 54 kg (119 lb 0.8 oz)   SpO2 99%   BMI 20.43 kg/m  Constitutional: She appears well-developed and well-nourished. NAD. HENT: Normocephalic and atraumatic. Edentulous   Eyes: EOMI. No discharge.  Cardiovascular: Normal rate and regular rhythm.  No JVD. Respiratory: Effort normal and breath sounds normal.  GI: Soft. Bowel sounds are normal.  Musculoskeletal: She exhibits edema and tenderness.  Neurological: She is alert and oriented.  Motor: B/l UE 4/5 RLE: HF 2/5, KE 3/5, ADF/PF 4/5 LLE: HF 4/5  Skin: Skin is warm and dry. Dusky extremities Right toes ischemic appearing with boggy heel.   Stump with staples c/d/i Psychiatric: She has a normal mood and affect. Her behavior is normal.    Assessment/Plan: 1. Functional deficits secondary to left BKA which require 3+ hours per day of interdisciplinary therapy in a comprehensive inpatient rehab  setting. Physiatrist is providing close team supervision and 24 hour management of active medical problems listed below. Physiatrist and rehab team continue to assess barriers to discharge/monitor patient progress toward functional and medical goals.  Function:  Bathing Bathing position      Bathing parts      Bathing assist        Upper Body Dressing/Undressing Upper body dressing                    Upper body assist        Lower Body Dressing/Undressing Lower body dressing                                  Lower body assist        Toileting Toileting          Toileting assist     Transfers Chair/bed transfer   Chair/bed transfer method: Stand pivot Chair/bed transfer assist level: Moderate assist (Pt 50 - 74%/lift or lower)       Locomotion Ambulation           Wheelchair          Cognition Comprehension Comprehension assist level: Follows basic conversation/direction with no assist  Expression Expression assist level: Expresses basic  needs/ideas: With no assist  Social Interaction Social Interaction assist level: Interacts appropriately with others with medication or extra time (anti-anxiety, antidepressant).  Problem Solving Problem solving assist level: Solves basic 75 - 89% of the time/requires cueing 10 - 24% of the time  Memory Memory assist level: Recognizes or recalls 90% of the time/requires cueing < 10% of the time    Medical Problem List and Plan: 1.  Gait abnormality, difficulty with transfers, limitation with self-care secondary to left BKA.  Begin CIR 2.  DVT Prophylaxis/Anticoagulation: Pharmaceutical: Coumadin  INR therapeutic 3/22 3. Chronic back pain/Pain Management: On gabapentin for neuropathy. Used dilaudid at home.  Continue oxycodone prn for now.   Monitor with increased activity 4. Mood: Improving with decrease in pain. LCSW to follow for evaluation and support.  5. Neuropsych: This patient is capable of  making decisions on her own behalf. 6. Skin/Wound Care: Routine wound care. Monitor stump. 7. Fluids/Electrolytes/Nutrition: Strict I/O. Serial labs with HD to monitor lytes 8. ESRD: Monitor weight daily with strict I/O. Schedule HD late afternoons to help with tolerance of activity during the day.  9. CAD s/p CABG with AVR: Cont meds 10. 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 T2DM: Monitor BS ac/hs. Po intake poor--used 2 units levemir daily prn BS > 150. Will use SSI for elevated BS until intake is consistent.   Monitor with increased activity 12. HTN: Monitor bid.   Monitor with increased activity 13. H/o Migraines: Managed with prn medications.  14. Anemia of chronic disease: Aranesp weekly with transfusion prn.   Cont to monitor 15. COPD: Respiratory status stable.  16. Leucocytosis: Resolving. Monitor for trend and temperature curve.   Cont to monitor   LOS (Days) 1 A FACE TO FACE EVALUATION WAS PERFORMED  Ankit [name] 05/01/2016 8:35 AM

## 2016-05-01 NOTE — IPOC Note (Signed)
Overall Plan of Care (IPOC) Patient Details Name: [name] MRN: [id number] DOB: 07-01-1955  Admitting Diagnosis: L BKA  Hospital Problems: Active Problems:   S/P unilateral BKA (below knee amputation), left (HCC)   Unilateral complete BKA, left, initial encounter ([place])   Chronic midline low back pain without sciatica   ESRD on dialysis ([place])   Type 2 diabetes mellitus with complication, with long-term current use of insulin ([place])     Functional Problem List: Nursing Endurance, Motor, Pain, Safety, Skin Integrity, Nutrition  PT Balance, Edema, Endurance, Motor, Nutrition, Pain, Safety, Sensory, Skin Integrity  OT Balance, Motor, Endurance, Cognition, Pain  SLP    TR         Basic ADL's: OT Grooming, Bathing, Dressing, Toileting     Advanced  ADL's: OT Simple Meal Preparation, Light Housekeeping     Transfers: PT Bed Mobility, Bed to Chair, Car, Manufacturing systems engineer, [name]: PT Ambulation, Emergency planning/management officer, Stairs     Additional Impairments: OT    SLP        TR      Anticipated Outcomes Item Anticipated Outcome  Self Feeding independent  Swallowing      Basic self-care  supervision  Toileting  supervision   Bathroom Transfers supervision  Bowel/Bladder  Anuric,Continent to bowel with mod. assist.  Transfers  supervision  Locomotion  supervision household  Communication     Cognition     Pain  Less than 3,on 1 to 10 scale  Safety/Judgment  Free from falls during her stay in rehab   Therapy Plan: PT Intensity: Minimum of 1-2 x/day ,45 to 90 minutes PT Frequency: 5 out of 7 days PT Duration Estimated Length of Stay: 10-12 days OT Intensity: Minimum of 1-2 x/day, 45 to 90 minutes OT Frequency: 5 out of 7 days OT Duration/Estimated Length of Stay: 10-12 days         Team Interventions: Nursing Interventions Patient/Family Education, Pain Management, Skin Care/Wound Management  PT interventions Ambulation/gait  training, Cognitive remediation/compensation, Training and development officer, Community reintegration, Discharge planning, Disease management/prevention, DME/adaptive equipment instruction, Functional mobility training, Neuromuscular re-education, Pain management, Patient/family education, Psychosocial support, Skin care/wound management, Stair training, Therapeutic Activities, Therapeutic Exercise, UE/LE Strength taining/ROM, Wheelchair propulsion/positioning, UE/LE Coordination activities  OT Interventions Training and development officer, Cognitive remediation/compensation, Community reintegration, Discharge planning, Self Care/advanced ADL retraining, Therapeutic Activities, UE/LE Coordination activities, Patient/family education, Functional mobility training, Neuromuscular re-education, DME/adaptive equipment instruction, UE/LE Strength taining/ROM, Therapeutic Exercise  SLP Interventions    TR Interventions    SW/CM Interventions Discharge Planning, Psychosocial Support, Patient/Family Education    Team Discharge Planning: Destination: PT-Home ,OT- Home , SLP-  Projected Follow-up: PT-Home health PT, 24 hour supervision/assistance, OT-  Home health OT, SLP-  Projected Equipment Needs: PT-Wheelchair (measurements), Wheelchair cushion (measurements), OT- 3 in 1 bedside comode, Rolling walker with 5" wheels, SLP-  Equipment Details: PT-16 x 16 with amputee support pad, owns RW, OT-  Patient/family involved in discharge planning: PT- Patient, Family Midwife,  OT-Patient, Family member/caregiver, SLP-   MD ELOS: 10-12 days. Medical Rehab Prognosis:  Good Assessment: 61 y.o.femalewith history of ESRD--HD MWF, Hep C s/p liver transplant, CAD s/p CABG with AVR, chronic pain, PAD with chronic heel ulcer and ischemic toes. She has had progressive pain and was in process of being set for BKA. She was admitted on 04/21/16 with excruciating pain and inability to stand. Chronic coumadin held with heparin  bridge till INR normalized. She underwent lexiscan and was cleared  for surgery by cardiology. She underwent L-BKA by Dr. Scot Dock on 04/24/16.  Continues on IV heparin as INR subtherapeutic. Noted to have drop in H/H to 8.0/23.8 and to be transfused with 2 units PRBC. She continues to be limited by lethargy, intermittent confusion and pain affecting ADL tasks and mobility. Will set goals for supervision with PT/OT.  See Team Conference Notes for weekly updates to the plan of care

## 2016-05-01 NOTE — Progress Notes (Signed)
Physical Therapy Session Note  Patient Details  Name: [name] MRN: [id number] Date of Birth: 04/17/1955  Today's Date: 05/01/2016 PT Individual Time: 1530-1600 PT Individual Time Calculation (min): 30 min   Short Term Goals: Week 1:  PT Short Term Goal 1 (Week 1): Patient will perform transfers with consistent min A.  PT Short Term Goal 2 (Week 1): Patient will ambulate 25 ft using RW with min A. PT Short Term Goal 3 (Week 1): Patient will perform sit <> stand using RW with min A.  Skilled Therapeutic Interventions/Progress Updates:   Pt received sitting in WC and agreeable to PT  Pt transported in WC to rehab gym by PT for energy conservation. PT instructed patient Squat pivot transfer to and from mat table with min=mod assist from PT and moderate verbal instruction for proper setup and head/hips relationship. Car transfer also instructed by PT with mod assist. PT provided moderate cues for improved positioning and UE placement to improve safety with transfer.   WC mobility completed x 150ft with supervision assist from PT with min cues for technique through doorway to prevent hitting door frame on the L.   Patient returned too room and left sitting in WC with call bell in reach and all needs met.        Therapy Documentation Precautions:  Precautions Precautions: Fall Required Braces or Orthoses: Knee Immobilizer - Left Knee Immobilizer - Left: Other (comment) (on except in PT) Restrictions Weight Bearing Restrictions: Yes LLE Weight Bearing: Non weight bearing    Vital Signs: Therapy Vitals Temp: 97.6 F (36.4 C) Temp Source: Oral Pulse Rate: 63 Resp: 17 BP: (!) 101/43 Patient Position (if appropriate): Sitting Oxygen Therapy SpO2: 98 % O2 Device: Not Delivered Pain: 0/10   See Function Navigator for Current Functional Status.   Therapy/Group: Individual Therapy   E  05/01/2016, 5:44 PM  

## 2016-05-01 NOTE — Progress Notes (Signed)
Occupational Therapy Session Note  Patient Details  Name: [name] MRN: [id number] Date of Birth: 1955/12/12  Today's Date: 05/01/2016 OT Individual Time: 1445-1530 OT Individual Time Calculation (min): 45 min    Short Term Goals: Week 1:  OT Short Term Goal 1 (Week 1): Pt will complete all bathing sit to stand with supervision.  OT Short Term Goal 2 (Week 1): Pt will completed toilet transfers and toileting with supervision sit to stand.  OT Short Term Goal 3 (Week 1): Pt will complete LB dressing sit to stand with supervision.  OT Short Term Goal 4 (Week 1): Pt will perform standing at the sink for 2 mins with no more than supervision during completion of grooming tasks.    Skilled Therapeutic Interventions/Progress Updates:  OT treatment session focused on pt/family education, transfer training, and UB there-ex. Bed>drop-arm wc transfer w/ min A and verbal cues for technique. B UE coordination w/ w/c propulsion to therapy apartment. Discussed home bathroom set-up with pt and daughters, pt will likely need extended tub bench to access garden tub, declined practicing tub transfer. W/c pushups 3 sets of 10 with rest breaks in between. Pt able to clear bottom from wc seat with tactile cues for UE positioning. Pt left with daughters present and chair alarm on.    Balance/vestibular training;Cognitive remediation/compensation;Community reintegration;Discharge planning;Self Care/advanced ADL retraining;Therapeutic Activities;UE/LE Coordination activities;Patient/family education;Functional mobility training;Neuromuscular re-education;DME/adaptive equipment instruction;UE/LE Strength taining/ROM;Therapeutic Exercise   Therapy Documentation Precautions:  Precautions Precautions: Fall Required Braces or Orthoses: Knee Immobilizer - Left Knee Immobilizer - Left: Other (comment) (on except in PT) Restrictions Weight Bearing Restrictions: Yes LLE Weight Bearing: Non weight  bearing Pain: Pain Assessment Pain Assessment: 0-10 Faces Pain Scale: Hurts whole lot Pain Type: Acute pain;Surgical pain Pain Location: Leg Pain Orientation: Left Pain Descriptors / Indicators: Sharp;Shooting;Aching Pain Frequency: Constant Pain Onset: On-going Patients Stated Pain Goal: 4 Pain Intervention(s): Repositioned    See Function Navigator for Current Functional Status.   Therapy/Group: Individual Therapy  [name] 05/01/2016, 4:28 PM

## 2016-05-01 NOTE — Care Management Note (Signed)
[facility] Individual Statement of Services  Patient Name:  [name]  Date:  05/01/2016  Welcome to the [facility].  Our goal is to provide you with an individualized program based on your diagnosis and situation, designed to meet your specific needs.  With this comprehensive rehabilitation program, you will be expected to participate in at least 3 hours of rehabilitation therapies Monday-Friday, with modified therapy programming on the weekends.  Your rehabilitation program will include the following services:  Physical Therapy (PT), Occupational Therapy (OT), 24 hour per day rehabilitation nursing, Neuropsychology, Case Management (Social Worker), Rehabilitation Medicine, Nutrition Services and Pharmacy Services  Weekly team conferences will be held on Wednesday to discuss your progress.  Your Social Worker will talk with you frequently to get your input and to update you on team discussions.  Team conferences with you and your family in attendance may also be held.  Expected length of stay: 10-12 days  Overall anticipated outcome: supervision with cueing  Depending on your progress and recovery, your program may change. Your Social Worker will coordinate services and will keep you informed of any changes. Your Social Worker's name and contact numbers are listed  below.  The following services may also be recommended but are not provided by the Campus:    [facility] will be made to provide these services after discharge if needed.  Arrangements include referral to agencies that provide these services.  Your insurance has been verified to be:  [place] Your primary doctor is:  Tabria Steines  Pertinent information will be shared with your doctor and your insurance company.  Social Worker:  Ovidio Kin, [place] or [phone]  Information discussed with and copy given to patient by: Elease Hashimoto, 05/01/2016, 9:16 AM

## 2016-05-02 ENCOUNTER — Inpatient Hospital Stay (HOSPITAL_COMMUNITY): Payer: Medicare Other

## 2016-05-02 ENCOUNTER — Inpatient Hospital Stay (HOSPITAL_COMMUNITY): Payer: Medicare Other | Admitting: Occupational Therapy

## 2016-05-02 ENCOUNTER — Inpatient Hospital Stay (HOSPITAL_COMMUNITY): Payer: Medicare Other | Admitting: Physical Therapy

## 2016-05-02 DIAGNOSIS — D638 Anemia in other chronic diseases classified elsewhere: Secondary | ICD-10-CM

## 2016-05-02 DIAGNOSIS — D72829 Elevated white blood cell count, unspecified: Secondary | ICD-10-CM

## 2016-05-02 DIAGNOSIS — I25810 Atherosclerosis of coronary artery bypass graft(s) without angina pectoris: Secondary | ICD-10-CM

## 2016-05-02 DIAGNOSIS — D62 Acute posthemorrhagic anemia: Secondary | ICD-10-CM

## 2016-05-02 DIAGNOSIS — R791 Abnormal coagulation profile: Secondary | ICD-10-CM | POA: Insufficient documentation

## 2016-05-02 LAB — CBC
HEMATOCRIT: 35.1 % — AB (ref 36.0–46.0)
HEMOGLOBIN: 11.8 g/dL — AB (ref 12.0–15.0)
MCH: 29.6 pg (ref 26.0–34.0)
MCHC: 33.6 g/dL (ref 30.0–36.0)
MCV: 88.2 fL (ref 78.0–100.0)
Platelets: 258 10*3/uL (ref 150–400)
RBC: 3.98 MIL/uL (ref 3.87–5.11)
RDW: 17.3 % — ABNORMAL HIGH (ref 11.5–15.5)
WBC: 11.8 10*3/uL — ABNORMAL HIGH (ref 4.0–10.5)

## 2016-05-02 LAB — GLUCOSE, CAPILLARY
GLUCOSE-CAPILLARY: 101 mg/dL — AB (ref 65–99)
GLUCOSE-CAPILLARY: 102 mg/dL — AB (ref 65–99)
GLUCOSE-CAPILLARY: 105 mg/dL — AB (ref 65–99)
GLUCOSE-CAPILLARY: 137 mg/dL — AB (ref 65–99)

## 2016-05-02 LAB — RENAL FUNCTION PANEL
ALBUMIN: 1.4 g/dL — AB (ref 3.5–5.0)
ANION GAP: 10 (ref 5–15)
BUN: 36 mg/dL — AB (ref 6–20)
CALCIUM: 8.5 mg/dL — AB (ref 8.9–10.3)
CO2: 27 mmol/L (ref 22–32)
Chloride: 97 mmol/L — ABNORMAL LOW (ref 101–111)
Creatinine, Ser: 5.64 mg/dL — ABNORMAL HIGH (ref 0.44–1.00)
GFR calc Af Amer: 9 mL/min — ABNORMAL LOW (ref >60)
GFR calc non Af Amer: 7 mL/min — ABNORMAL LOW (ref >60)
GLUCOSE: 146 mg/dL — AB (ref 65–99)
Phosphorus: 3.7 mg/dL (ref 2.5–4.6)
Potassium: 3.9 mmol/L (ref 3.5–5.1)
Sodium: 134 mmol/L — ABNORMAL LOW (ref 135–145)

## 2016-05-02 LAB — PROTIME-INR
INR: 3.15
Prothrombin Time: 33.1 seconds — ABNORMAL HIGH (ref 11.4–15.2)

## 2016-05-02 LAB — HEPARIN LEVEL (UNFRACTIONATED): HEPARIN UNFRACTIONATED: 0.49 [IU]/mL (ref 0.30–0.70)

## 2016-05-02 MED ORDER — WARFARIN SODIUM 2 MG PO TABS
2.0000 mg | ORAL_TABLET | Freq: Once | ORAL | Status: AC
  Administered 2016-05-03: 2 mg via ORAL
  Filled 2016-05-02: qty 1

## 2016-05-02 MED ORDER — SODIUM CHLORIDE 0.9 % IV SOLN
100.0000 mL | INTRAVENOUS | Status: DC | PRN
Start: 2016-05-02 — End: 2016-05-03

## 2016-05-02 MED ORDER — LIDOCAINE 5 % EX OINT
TOPICAL_OINTMENT | Freq: Two times a day (BID) | CUTANEOUS | Status: DC | PRN
  Administered 2016-05-02: 14:00:00 via TOPICAL
  Filled 2016-05-02 (×2): qty 35.44

## 2016-05-02 MED ORDER — SODIUM CHLORIDE 0.9 % IV SOLN: 100.0000 mL | INTRAVENOUS | Status: DC | PRN

## 2016-05-02 NOTE — Progress Notes (Signed)
Physical Therapy Session Note  Patient Details  Name: [name] MRN: [id number] Date of Birth: [birth year]  Today's Date: 05/02/2016 PT Individual Time: 1035-1200 PT Individual Time Calculation (min): 85 min   Short Term Goals: Week 1:  PT Short Term Goal 1 (Week 1): Patient will perform transfers with consistent min A.  PT Short Term Goal 2 (Week 1): Patient will ambulate 25 ft using RW with min A. PT Short Term Goal 3 (Week 1): Patient will perform sit <> stand using RW with min A.  Skilled Therapeutic Interventions/Progress Updates:   Patient in wheelchair upon arrival, c/o increased LLE pain, RN notified and administered pain medication during session. Recreational therapist present for co-treat. Patient c/o buttock pain sitting in wheelchair, instructed in wheelchair pushups and lateral leans for pressure relief. Patient propelled wheelchair 100 ft using BUE with supervision and assist for WC parts management. Patient required max multimodal cues for hand placement, R LE placement, safety, and overall technique for sit <> stand from wheelchair x 3. Patient ambulated 3 steps using RW with min-mod A for standing balance but terminated due to decreased RLE clearance. Performed squat pivot transfer from wheelchair to and from mat table with mod A overall and verbal/tactile cues for hand placement, anterior weight shift, and head hips relationship. Seated EOM, patient engaged in Wii Dance to challenge cardiovascular endurance with BUE AROM and core strengthening. Performed bed mobility on mat table with supervision-min A and increased time. BLE therex for strengthening and ROM: seated BLE knee flexion/extension, seated BLE hip flexion, sidelying L hip flexion/extension to neutral with max verbal/tactile cues for knee extension and initiation/continuation of task with manual assistance to guide patient through movement to fatigue, BLE SLR x 20, SAQ over bolster x 20 BLE, bridging over bolster 2 x  10. Switched out and fitted patient for Roho wheelchair cushion for improved pressure relief and sitting tolerance. Patient left sitting in wheelchair with quick release belt donned and all needs in reach.   Therapy Documentation Precautions:  Precautions Precautions: Fall Required Braces or Orthoses: Knee Immobilizer - Left Knee Immobilizer - Left: Other (comment) (on except in PT) Restrictions Weight Bearing Restrictions: No LLE Weight Bearing: Non weight bearing Pain: Pain Assessment Pain Assessment: 0-10 Pain Score: 7  Pain Type: Acute pain Pain Location: Knee Pain Orientation: Left;Anterior Pain Descriptors / Indicators: Dull Pain Frequency: Constant Pain Onset: On-going Patients Stated Pain Goal: 4 Pain Intervention(s): RN made aware;Rest  See Function Navigator for Current Functional Status.   Therapy/Group: Individual Therapy  Shyne Lehrke, Murray Hodgkins 05/02/2016, 12:20 PM

## 2016-05-02 NOTE — Progress Notes (Signed)
PHYSICAL MEDICINE & REHABILITATION     PROGRESS NOTE  Subjective/Complaints:  Pt seen laying in bed this AM.  She notes difficulty sleeping due to pain in her pressure sores.    ROS: +decubitus ulcer pain. Denies CP, SOB, N/V/D.  Objective: Vital Signs: Blood pressure (!) 149/70, pulse 71, temperature 98.1 F (36.7 C), temperature source Oral, resp. rate 18, height 5\' 4"  (1.626 m), weight 54.3 kg (119 lb 11.4 oz), SpO2 99 %. No results found.  Recent Labs  04/30/16 0709 05/02/16 0611  WBC 13.9* 11.8*  HGB 8.0* 11.8*  HCT 23.8* 35.1*  PLT 184 258    Recent Labs  04/30/16 0709  NA 132*  K 4.1  CL 99*  GLUCOSE 120*  BUN 26*  CREATININE 5.35*  CALCIUM 8.2*   CBG (last 3)   Recent Labs  05/01/16 1627 05/01/16 2335 05/02/16 0644  GLUCAP 119* 102* 101*    Wt Readings from Last 3 Encounters:  05/02/16 54.3 kg (119 lb 11.4 oz)  04/30/16 54.7 kg (120 lb 9.5 oz)  04/17/16 58.7 kg (129 lb 6.4 oz)    Physical Exam:  BP (!) 149/70   Pulse 71   Temp 98.1 F (36.7 C) (Oral)   Resp 18   Ht 5\' 4"  (1.626 m)   Wt 54.3 kg (119 lb 11.4 oz)   SpO2 99%   BMI 20.55 kg/m  Constitutional: She appears well-developed and well-nourished. NAD. HENT: Normocephalic and atraumatic. Edentulous   Eyes: EOMI. No discharge.  Cardiovascular: RRR. No JVD. Respiratory: Effort normal and breath sounds normal.  GI: Soft. Bowel sounds are normal.  Musculoskeletal: She exhibits edema and tenderness.  Neurological: She is alert and oriented.  Motor: B/l UE 4/5 RLE: HF 2+/5, KE 3/5, ADF/PF 4/5 (improving) LLE: HF 4/5  Skin: Skin is warm and dry. Dusky extremities Right toes ischemic appearing with boggy heel.   Stump with staples c/d/I Decubitus ulcers Psychiatric: She has a normal mood and affect. Her behavior is normal.    Assessment/Plan: 1. Functional deficits secondary to left BKA which require 3+ hours per day of interdisciplinary therapy in a comprehensive  inpatient rehab setting. Physiatrist is providing close team supervision and 24 hour management of active medical problems listed below. Physiatrist and rehab team continue to assess barriers to discharge/monitor patient progress toward functional and medical goals.  Function:  Bathing Bathing position   Position: Sitting EOB  Bathing parts Body parts bathed by patient: Right arm, Left arm, Chest, Abdomen, Front perineal area, Buttocks, Right upper leg, Left upper leg, Right lower leg Body parts bathed by helper: Back  Bathing assist        Upper Body Dressing/Undressing Upper body dressing   What is the patient wearing?: Hospital gown     Pull over shirt/dress - Perfomed by patient: Thread/unthread right sleeve, Thread/unthread left sleeve, Put head through opening, Pull shirt over trunk          Upper body assist Assist Level: Set up, Supervision or verbal cues   Set up : To obtain clothing/put away  Lower Body Dressing/Undressing Lower body dressing   What is the patient wearing?: Underwear, Pants, Non-skid slipper socks Underwear - Performed by patient: Thread/unthread right underwear leg, Thread/unthread left underwear leg Underwear - Performed by helper: Pull underwear up/down Pants- Performed by patient: Thread/unthread left pants leg Pants- Performed by helper: Thread/unthread right pants leg, Pull pants up/down Non-skid slipper socks- Performed by patient: Don/doff right sock (L sock NA)  Lower body assist Assist for lower body dressing: 2 Helpers      Toileting Toileting          Toileting assist     Transfers Chair/bed transfer   Chair/bed transfer method: Squat pivot Chair/bed transfer assist level: Moderate assist (Pt 50 - 74%/lift or lower) Chair/bed transfer assistive device: Armrests     Locomotion Ambulation     Max distance: 11 ft Assist level: Touching or steadying assistance (Pt > 75%)   Wheelchair   Type:  Manual Max wheelchair distance: 150 Assist Level: Supervision or verbal cues  Cognition Comprehension Comprehension assist level: Understands basic 75 - 89% of the time/ requires cueing 10 - 24% of the time  Expression Expression assist level: Expresses basic 75 - 89% of the time/requires cueing 10 - 24% of the time. Needs helper to occlude trach/needs to repeat words.  Social Interaction Social Interaction assist level: Interacts appropriately 90% of the time - Needs monitoring or encouragement for participation or interaction.  Problem Solving Problem solving assist level: Solves basic 75 - 89% of the time/requires cueing 10 - 24% of the time  Memory Memory assist level: Recognizes or recalls 75 - 89% of the time/requires cueing 10 - 24% of the time    Medical Problem List and Plan: 1.  Gait abnormality, difficulty with transfers, limitation with self-care secondary to left BKA.  Cont CIR 2.  DVT Prophylaxis/Anticoagulation: Pharmaceutical: Transition from heparin ggt to Coumadin  INR supratherapeutic 3/23 3. Chronic back pain/Pain Management: On gabapentin for neuropathy. Used dilaudid at home.  Continue oxycodone prn for now.   Monitor with increased activity  Lidocaineointment for pressure sores 4. Mood: Improving with decrease in pain. LCSW to follow for evaluation and support.  5. Neuropsych: This patient is capable of making decisions on her own behalf. 6. Skin/Wound Care: Routine wound care. Monitor stump. 7. Fluids/Electrolytes/Nutrition: Strict I/O. Serial labs with HD to monitor lytes 8. ESRD: Monitor weight daily with strict I/O. Schedule HD late afternoons to help with tolerance of activity during the day.  9. CAD s/p CABG with AVR: Cont meds 10. 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 T2DM: Monitor BS ac/hs. Po intake poor--used 2 units levemir daily prn BS > 150. Will use SSI for elevated BS until intake is consistent.   Overall controlled 3/23 12. HTN: Monitor  bid.   Labile with elevations this AM, monitor in accordance with HD 13. H/o Migraines: Managed with prn medications.  14. Anemia of chronic disease: Aranesp weekly with transfusion prn.   Hb 11.8 on 3/23  Cont to monitor 15. COPD: Respiratory status stable.  16. Leucocytosis: Improving.   WBCs 11.8 on 3/23  Cont to monitor  LOS (Days) 2 A FACE TO FACE EVALUATION WAS PERFORMED  Jaimon Bugaj [name] 05/02/2016 8:56 AM

## 2016-05-02 NOTE — Progress Notes (Signed)
[facility] Progress Note   Subjective: no /co, working with PT  Vitals:   05/01/16 0843 05/01/16 1345 05/02/16 0409 05/02/16 0803  BP: 127/70 (!) 101/43 (!) 153/67 (!) 149/70  Pulse: 81 63 67 71  Resp:  17 18   Temp:  97.6 F (36.4 C) 98.1 F (36.7 C)   TempSrc:  Oral Oral   SpO2:  98% 99%   Weight:   54.3 kg (119 lb 11.4 oz)   Height:        Inpatient medications: . aspirin EC  81 mg Oral Daily  . calcitRIOL  0.5 mcg Oral Q M,W,F-HD  . calcium acetate  667 mg Oral TID WC  . [START ON 05/05/2016] darbepoetin (ARANESP) injection - DIALYSIS  100 mcg Intravenous Q Mon-HD  . feeding supplement  1 Container Oral BID BM  . feeding supplement (PRO-STAT SUGAR FREE 64)  30 mL Oral BID  . gabapentin  100 mg Oral Q M,W,F-HD  . gabapentin  300 mg Oral QHS  . insulin aspart  0-5 Units Subcutaneous QHS  . insulin aspart  0-9 Units Subcutaneous TID WC  . levothyroxine  175 mcg Oral QAC breakfast  . metoprolol tartrate  25 mg Oral BID  . multivitamin  1 tablet Oral QHS  . pantoprazole  40 mg Oral Daily  . polyethylene glycol  17 g Oral Daily  . tacrolimus  2 mg Oral BID  . Warfarin - Pharmacist Dosing Inpatient   Does not apply q1800    sodium chloride, sodium chloride, alteplase, aluminum hydroxide, bisacodyl, diphenhydrAMINE, famotidine, guaiFENesin-dextromethorphan, heparin, lidocaine (PF), lidocaine, lidocaine-prilocaine, ondansetron **OR** ondansetron (ZOFRAN) IV, oxyCODONE, pentafluoroprop-tetrafluoroeth, phenol, polyethylene glycol, prochlorperazine **OR** prochlorperazine **OR** prochlorperazine, senna-docusate, sodium phosphate, traMADol, traZODone  Exam: Alert, no distress, calm No jvd Chest clear bilat RRR +valve click Abd soft ntnd L BKA wrapped, no RLE edema R IJ TDC in place NF, ox 3  Dialysis: [name] MWF 3h 1min   2/2 bath  58kg   Hep none   R IJ cath - calcitriol 0.5 ug tiw - no ESA, last Mircera was on 2/22 - labs > last Hb 11.8, tsat 32%, pth  573, P 5      Assessment: 1. 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 Debility - on CIR now 3. ESRD MWF HD 4. Anemia of CKD - got esa 100 ug on 3/19 5. CKD-MBD - cont meds 6. Volume - no vol excess, will need lower dry wt by 2-4kg typically due to amputation 7. Hx AVR on coumadin - INR up 2.5 today 8. Hx liver Tx - on prograf only  9. DM on insulin 10. DNR  Plan - HD today   [name] MD [facility] pager [phone]   05/02/2016, 12:44 PM    Recent Labs Lab 04/27/16 0541 04/28/16 0532 04/30/16 0709  NA 132* 132* 132*  K 3.8 3.9 4.1  CL 97* 96* 99*  CO2 24 24 21*  GLUCOSE 144* 159* 120*  BUN 25* 38* 26*  CREATININE 5.13* 6.60* 5.35*  CALCIUM 8.3* 8.0* 8.2*   No results for input(s): AST, ALT, ALKPHOS, BILITOT, PROT, ALBUMIN in the last 168 hours.  Recent Labs Lab 04/29/16 0527 04/30/16 0709 05/02/16 0611  WBC 15.3* 13.9* 11.8*  HGB 8.3* 8.0* 11.8*  HCT 24.8* 23.8* 35.1*  MCV 84.4 87.2 88.2  PLT 193 184 258   Iron/TIBC/Ferritin/ %Sat    Component Value Date/Time   IRON 22 (L) 03/12/2016 1308   TIBC 181 (L) 03/12/2016  [place] (H) 08/30/2012 1449   IRONPCTSAT 12 03/12/2016 1308

## 2016-05-02 NOTE — Progress Notes (Signed)
Occupational Therapy Session Note  Patient Details  Name: [name] MRN: [id number] Date of Birth: 07-Aug-1955  Today's Date: 05/02/2016 OT Individual Time: 6837-2902 OT Individual Time Calculation (min): 74 min    Short Term Goals: Week 1:  OT Short Term Goal 1 (Week 1): Pt will complete all bathing sit to stand with supervision.  OT Short Term Goal 2 (Week 1): Pt will completed toilet transfers and toileting with supervision sit to stand.  OT Short Term Goal 3 (Week 1): Pt will complete LB dressing sit to stand with supervision.  OT Short Term Goal 4 (Week 1): Pt will perform standing at the sink for 2 mins with no more than supervision during completion of grooming tasks.    Skilled Therapeutic Interventions/Progress Updates:    Pt completed bathing and dressing sit to stand on the EOB.  Pt with more pain in the LLE this am compared to yesterday.  Supervision for washing all UB and donning pullover shirt.  Mod assist with mod instructional cueing for standing to pull underpants and pants over hips.  Pt completed stand pivot to wheelchair with use of the RW and mod assist as well.  Decreased ability to complete backwards stepping with use of the RW.  Once in the wheelchair pt was rolled to the sink where she completed oral hygiene from wheelchair level.  Finished session with UE exercises using 4 lb dowel rod.  Pt completed 2 sets of 10 repetitions for shoulder flexion with min assist for technique.  She then used the dowel rod to hit a beach ball back to the therapist with BUEs for 3 sets of 10 repetitions.  Pt left in wheelchair at end of session with call button and phone in reach.  Safety belt in place as well.    Therapy Documentation Precautions:  Precautions Precautions: Fall Required Braces or Orthoses: Knee Immobilizer - Left Knee Immobilizer - Left: Other (comment) (on except in PT) Restrictions Weight Bearing Restrictions: Yes LLE Weight Bearing: Non weight  bearing  Pain: Pain Assessment Pain Assessment: No/denies pain Pain Score: 7  Pain Type: Acute pain Pain Location: Knee Pain Orientation: Left;Anterior Pain Descriptors / Indicators: Dull Pain Frequency: Constant Pain Onset: On-going Patients Stated Pain Goal: 4 Pain Intervention(s): RN made aware;Rest ADL: See Function Navigator for Current Functional Status.   Therapy/Group: Individual Therapy  Ryaan Vanwagoner OTR/L 05/02/2016, 12:22 PM

## 2016-05-02 NOTE — Progress Notes (Signed)
ANTICOAGULATION CONSULT NOTE - Follow Up Consult  Pharmacy Consult for Heparin bridge to Warfarin Indication: AVR  Allergies  Allergen Reactions  . Acetaminophen Other (See Comments)    Liver transplant recipient   . Morphine Itching and Other (See Comments)    hallucinate  . Codeine Itching  . Mirtazapine Other (See Comments)    hallucination    Patient Measurements: Height: 5\' 4"  (162.6 cm) Weight: 119 lb 11.4 oz (54.3 kg) IBW/kg (Calculated) : 54.7 Heparin Dosing Weight: 54 kg  Vital Signs: Temp: 98.1 F (36.7 C) (03/23 0409) Temp Source: Oral (03/23 0409) BP: 149/70 (03/23 0803) Pulse Rate: 71 (03/23 0803)  Labs:  Recent Labs  04/30/16 0709 04/30/16 0843 05/01/16 0519 05/01/16 1802 05/02/16 0611  HGB 8.0*  --   --   --  11.8*  HCT 23.8*  --   --   --  35.1*  PLT 184  --   --   --  258  LABPROT  --  27.8* 25.2*  --  33.1*  INR  --  2.53 2.25  --  3.15  HEPARINUNFRC  --  0.44  --  <0.10* 0.49  CREATININE 5.35*  --   --   --   --     Estimated Creatinine Clearance: 9.5 mL/min (A) (by C-G formula based on SCr of 5.35 mg/dL (H)).   Assessment: 61 yo female with AVR is currently on heparin bridge to therapeutic coumadin. INR 3.15, heparin level 0.49, therapeutic this monirng.   Coumadin PTA dose: 2mg  daily (per anticoag clinic note on 3/8)  Goal of Therapy:  INR 2.5-3.5 Monitor platelets by anticoagulation protocol: Yes   Plan:  - coumadin 2mg  po x 1 mg po x1 - INR in am - d/c IV heparin  Maryanna Shape, PharmD, BCPS  Clinical Pharmacist  Pager: [phone]  05/02/2016,1:44 PM

## 2016-05-02 NOTE — Progress Notes (Signed)
   VASCULAR SURGERY ASSESSMENT & PLAN:   Postop day 8 status post left BKA  Doing well in rehabilitation.  Her Coumadin is therapeutic.   SUBJECTIVE:   "Tired"  PHYSICAL EXAM:   Vitals:   05/01/16 1345 05/02/16 0409 05/02/16 0803 05/02/16 1350  BP: (!) 101/43 (!) 153/67 (!) 149/70 103/72  Pulse: 63 67 71 60  Resp: 17 18  17   Temp: 97.6 F (36.4 C) 98.1 F (36.7 C)  98.5 F (36.9 C)  TempSrc: Oral Oral  Oral  SpO2: 98% 99%  99%  Weight:  119 lb 11.4 oz (54.3 kg)    Height:       Dressing on L BKA is dry.  Knee immoblizer is on.   LABS:   Lab Results  Component Value Date   WBC 11.8 (H) 05/02/2016   HGB 11.8 (L) 05/02/2016   HCT 35.1 (L) 05/02/2016   MCV 88.2 05/02/2016   PLT 258 05/02/2016   Lab Results  Component Value Date   CREATININE 5.35 (H) 04/30/2016   Lab Results  Component Value Date   INR 3.15 05/02/2016   CBG (last 3)   Recent Labs  05/01/16 2335 05/02/16 0644 05/02/16 1207  GLUCAP 102* 101* 105*    PROBLEM LIST:    Active Problems:   S/P unilateral BKA (below knee amputation), left (HCC)   Unilateral complete BKA, left, initial encounter (HCC)   Chronic midline low back pain without sciatica   ESRD on dialysis ([place])   Type 2 diabetes mellitus with complication, with long-term current use of insulin (HCC)   Supratherapeutic INR   Anemia of chronic disease   CURRENT MEDS:   . aspirin EC  81 mg Oral Daily  . calcitRIOL  0.5 mcg Oral Q M,W,F-HD  . calcium acetate  667 mg Oral TID WC  . [START ON 05/05/2016] darbepoetin (ARANESP) injection - DIALYSIS  100 mcg Intravenous Q Mon-HD  . feeding supplement  1 Container Oral BID BM  . feeding supplement (PRO-STAT SUGAR FREE 64)  30 mL Oral BID  . gabapentin  100 mg Oral Q M,W,F-HD  . gabapentin  300 mg Oral QHS  . insulin aspart  0-5 Units Subcutaneous QHS  . insulin aspart  0-9 Units Subcutaneous TID WC  . levothyroxine  175 mcg Oral QAC breakfast  . metoprolol tartrate  25 mg  Oral BID  . multivitamin  1 tablet Oral QHS  . pantoprazole  40 mg Oral Daily  . polyethylene glycol  17 g Oral Daily  . tacrolimus  2 mg Oral BID  . warfarin  2 mg Oral ONCE-1800  . Warfarin - Pharmacist Dosing Inpatient   Does not apply [place]: [phone] Office: [phone] 05/02/2016

## 2016-05-02 NOTE — Progress Notes (Signed)
Occupational Therapy Session Note  Patient Details  Name: [name] MRN: [id number] Date of Birth: 1955/11/16  Today's Date: 05/02/2016 OT Individual Time: 1715-1745 OT Individual Time Calculation (min): 30 min    Short Term Goals: Week 1:  OT Short Term Goal 1 (Week 1): Pt will complete all bathing sit to stand with supervision.  OT Short Term Goal 2 (Week 1): Pt will completed toilet transfers and toileting with supervision sit to stand.  OT Short Term Goal 3 (Week 1): Pt will complete LB dressing sit to stand with supervision.  OT Short Term Goal 4 (Week 1): Pt will perform standing at the sink for 2 mins with no more than supervision during completion of grooming tasks.    Skilled Therapeutic Interventions/Progress Updates:    1:1 session. Focus of tub transfers, functional mobility, and DME education. Pt reports fatigue upon entering session and declines opportunity to stand or complete toilet transfer. Pt asks about tub transfers. Educated pt that she is not medically cleared for showering at this time, however pt interested in DME available to when she could go home and shower. Pt lateral scoot with touching assistance EOB>w/c with VC for safety awareness, hand placement and sequencing. In tub room, pt lateral scoot transfer with touching assistance and step by step cueing for LE management and safety awareness w/c<>TTB. While seated on tub bench, educated pt and family member on non slip stickers for in bottom of tub, hand held shower heads, and using lateral leans to wash bottom. Pt simulated lateral leans with supervision. Pt returned to room seated in w/c with QRB donned, call light in reach and family in room.  Therapy Documentation Precautions:  Precautions Precautions: Fall Required Braces or Orthoses: Knee Immobilizer - Left Knee Immobilizer - Left: Other (comment) (on except in PT) Restrictions Weight Bearing Restrictions: Yes LLE Weight Bearing: Non weight  bearing  See Function Navigator for Current Functional Status.   Therapy/Group: Individual Therapy  Tonny Branch 05/02/2016, 5:56 PM

## 2016-05-02 NOTE — Evaluation (Signed)
Recreational Therapy Assessment and Plan  Patient Details  Name: [name] MRN: [id number] Date of Birth: [birth year] Today's Date: 05/02/2016  Rehab Potential: Good ELOS: 10 days   Assessment Clinical Impression:Problem List:      Patient Active Problem List   Diagnosis Date Noted  . Chronic midline low back pain without sciatica   . ESRD on dialysis ([place])   . Type 2 diabetes mellitus with complication, with long-term current use of insulin ([place])   . S/P unilateral BKA (below knee amputation), left ([place]) 04/30/2016  . Unilateral complete BKA, left, initial encounter ([place]) 04/30/2016  . Abnormality of gait   . Coronary artery disease involving coronary bypass graft of native heart without angina pectoris   . Benign essential HTN   . History of migraine   . Leukocytosis   . Critical lower limb ischemia 04/22/2016  . Foot pain 04/21/2016  . Fever   . Advance care planning   . Goals of care, counseling/discussion   . Palliative care by specialist   . Sepsis ([place])   . Hypotension   . Claudication of left lower extremity ([place])   . Elevated troponin 03/05/2016  . Pressure injury of skin, stage 2 11/27/2015  . H/O unilateral nephrectomy 07/06/2015  . Paroxysmal SVT (supraventricular tachycardia) ([place]) 06/22/2014  . Complications due to renal dialysis device, implant, and graft 05/11/2014  . Acute blood loss anemia 03/31/2014  . Renal dialysis device, implant, or graft complication 95/28/4132  . Acute hyperkalemia   . S/P liver transplant ([place])   . Chronic hepatitis C without hepatic coma ([place])   . Pulmonary edema 01/03/2014  . Respiratory failure ([place]) 01/03/2014  . Long term current use of anticoagulant therapy 04/22/2013  . Chronic anticoagulation w/ coumadin, goal INR 2.5-3.0 w/ AVR 04/14/2013  . Tobacco abuse 02/28/2013  . COPD (chronic obstructive pulmonary disease) ([place]) 02/21/2013  . Chronic combined systolic and diastolic congestive heart  failure ([place]) 02/06/2013  . Acute respiratory failure ([place]) 11/02/2012  . Acute pulmonary edema ([place]) 11/02/2012  . Acute on chronic systolic CHF (congestive heart failure) ([place]) 11/02/2012  . Dyslipidemia-LDL 104, not on statin with Hx of liver transplant 07/30/2012  . CAD (coronary artery disease), native coronary artery 07/27/2012  . History of prosthetic aortic valve replacement   . End stage renal disease on dialysis ([place]) 02/27/2011  . Hypothyroidism 06/25/2006  . Type 2 diabetes mellitus with chronic kidney disease ([place]) 06/25/2006  . GERD 06/25/2006  . Anemia due to chronic renal failure    . Hypertensive heart disease     Past Medical History:  Past Medical History:  Diagnosis Date  . Anemia    takes Folic Acid daily  . Anxiety   . Aortic stenosis   . Arthritis    "left hand, back" (08/30/2012)  . Asthma   . CAD (coronary artery disease)   . CAD (coronary artery disease) Jan. 2015   Cath: 20% LAD, 50% D1; s/p LIMA-LAD  . Chest pain 03/06/2015  . Chronic back pain   . Chronic bronchitis ([place])    "q yr w/season changes" (08/30/2012)  . Chronic constipation    takes MIralax and Colace daily  . COPD (chronic obstructive pulmonary disease) ([place])   . Depression    takes Cymbalta for "severe" depression  . End stage renal disease on dialysis [facility]) 02/27/2011   Kidneys shut down at time of liver transplant in Sept 2011 at [facility] in [place], she has been on HD ever since.  Dialyzes at [facility] HD on TTS schedule.  Had L forearm graft used 10 months then removed Dec 2012 due to suspected infection.  A right upper arm AVG was placed Dec 2012 but she developed steal symptoms acutely and it was ligated the same day.  Never had an AV fistula due to small veins.  Now has L thigh AVG put in Jan 2013, has not clotted to date.    [name] GERD (gastroesophageal reflux disease)    takes Omeprazole daily  . Headache    "at least monthly" (08/30/2012)  . Hepatitis C    . History of blood transfusion    "several" (08/30/2012)  . Hypertension    takes Metoprolol and Lisinopril daily, sees Dr Bea Graff  . Hypothyroidism    takes Synthroid daily  . Migraine    "last migraine was in 2013" (08/30/2012)  . Neuromuscular disorder ([place])    carpal tunnel in right hand  . Obesity   . Peripheral vascular disease (HCC) hands and legs  . Pneumonia    "today and several times before" (08/30/2012)  . 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 S/P unilateral BKA (below knee amputation), left ([place]) 04/30/2016  . SVT (supraventricular tachycardia) ([place]) 06/09/14  . Tobacco abuse   . Type II diabetes mellitus (HCC)    Levemir 2units daily if > 150   Past Surgical History:       Past Surgical History:  Procedure Laterality Date  . AMPUTATION Left 04/24/2016   Procedure: AMPUTATION BELOW KNEE;  Surgeon: Angelia Mould, MD;  Location: [place];  Service: Vascular;  Laterality: Left;  . AORTIC VALVE REPLACEMENT N/A 03/15/2013   AVR; Surgeon: Ivin Poot, MD;  Location: [facility] OR; Open Heart Surgery;  34m[employer] mechanical prosthesis, top hat valve  . ARTERIOVENOUS GRAFT PLACEMENT Left 10/03/10    forearm  . AV FISTULA PLACEMENT  01/29/2011   Procedure: INSERTION OF ARTERIOVENOUS (AV) GORE-TEX GRAFT ARM;  Surgeon: CElam Dutch MD;  [facility];  Service: Vascular;  Laterality: Right;  . AV FISTULA PLACEMENT  03/10/2011   Procedure: INSERTION OF ARTERIOVENOUS (AV) GORE-TEX GRAFT THIGH;  Surgeon: CElam Dutch MD;  [place]  Service: Vascular;  Laterality: Left;  . [place]  12/23/2010   Procedure: REMOVAL OF ARTERIOVENOUS GORETEX GRAFT ([place];  Surgeon: CElam Dutch MD;  [place]  Service: Vascular;  Laterality: Left;  procedure started _0 -1852  . CHOLECYSTECTOMY  1993   . CORONARY ARTERY BYPASS GRAFT N/A 03/15/2013   Procedure: CORONARY ARTERY BYPASS GRAFTING (CABG) times one using left internal mammary artery.;  Surgeon: PIvin Poot MD;  [place]  Service: Open Heart Surgery;  Laterality: N/A;  POSS CABG X 1  . CYSTOSCOPY  1990's  . INSERTION OF DIALYSIS CATHETER  12/23/2010   Procedure: INSERTION OF DIALYSIS CATHETER;  Surgeon: CElam Dutch MD;  [place]  Service: Vascular;  Laterality: Right;  Right Internal Jugular 28cm dialysis catheter insertion procedure time 1701-1720   . INSERTION OF DIALYSIS CATHETER Right 03/11/2016   Procedure: INSERTION OF DIALYSIS CATHETER;  Surgeon: CAngelia Mould MD;  [place]  Service: Vascular;  Laterality: Right;  . INTRAOPERATIVE TRANSESOPHAGEAL ECHOCARDIOGRAM N/A 03/15/2013   Procedure: INTRAOPERATIVE TRANSESOPHAGEAL ECHOCARDIOGRAM;  Surgeon: PIvin Poot MD;  Location:  [place] OR;  Service: Open Heart Surgery;  Laterality: N/A;  . LEFT HEART CATHETERIZATION WITH CORONARY ANGIOGRAM N/A 07/29/2012   Procedure: LEFT HEART CATHETERIZATION WITH CORONARY ANGIOGRAM;  Surgeon: Troy Sine, MD;  [facility] LAB;  Service: Cardiovascular;  Laterality: N/A;  . LEFT HEART CATHETERIZATION WITH CORONARY ANGIOGRAM N/A 03/10/2013   Procedure: LEFT HEART CATHETERIZATION WITH CORONARY ANGIOGRAM;  Surgeon: Troy Sine, MD;  [facility] LAB;  Service: Cardiovascular;  Laterality: N/A;  . LEFT HEART CATHETERIZATION WITH CORONARY/GRAFT ANGIOGRAM N/A 12/24/2011   Procedure: LEFT HEART CATHETERIZATION WITH Beatrix Fetters;  Surgeon: [name], MD;  Location: [facility] CATH LAB;  Service: Cardiovascular;  Laterality: N/A;  . LEFT HEART CATHETERIZATION WITH CORONARY/GRAFT ANGIOGRAM N/A 12/16/2013   Procedure: LEFT HEART CATHETERIZATION WITH Beatrix Fetters;  Surgeon: Troy Sine, MD;  Location: [facility] CATH LAB;  Service: Cardiovascular;  Laterality: N/A;  . LIGATION  ARTERIOVENOUS GORTEX GRAFT Left 03/11/2016   Procedure: LIGATION THIGH ARTERIOVENOUS GORTEX GRAFT;  Surgeon: Angelia Mould, MD;  Location: [place];  Service: Vascular;  Laterality: Left;  . LIVER TRANSPLANT  10/25/2009   sees Dr Ferol Luz 1 every 6 months, saw last in Dec 2013. [name] Coord [phone]  . PERIPHERAL VASCULAR CATHETERIZATION N/A 11/06/2015   Procedure: Abdominal Aortogram;  Surgeon: Serafina Mitchell, MD;  Location: [place] CV LAB;  Service: Cardiovascular;  Laterality: N/A;  . PERIPHERAL VASCULAR CATHETERIZATION N/A 11/06/2015   Procedure: Lower Extremity Angiography;  Surgeon: Serafina Mitchell, MD;  Location: [place] CV LAB;  Service: Cardiovascular;  Laterality: N/A;  . PERIPHERAL VASCULAR CATHETERIZATION  11/06/2015   Procedure: Peripheral Vascular Atherectomy;  Surgeon: Serafina Mitchell, MD;  Location: [place] CV LAB;  Service: Cardiovascular;;  Left Superficial femoral  . SHUNTOGRAM Left 05/15/2014   Procedure: SHUNTOGRAM;  Surgeon: Conrad [place], MD;  Location: [facility] CATH LAB;  Service: Cardiovascular;  Laterality: Left;  . SMALL INTESTINE SURGERY  90's  . SPINAL GROWTH RODS  2010   "put 2 metal rods in my back; they had detetriorated" (08/30/2012)  . THROMBECTOMY    . THROMBECTOMY AND REVISION OF ARTERIOVENTOUS (AV) GORETEX  GRAFT Left 03/30/2014  . THROMBECTOMY AND REVISION OF ARTERIOVENTOUS (AV) GORETEX  GRAFT Left 03/30/2014   Procedure: THROMBECTOMY AND REVISION OF ARTERIOVENTOUS (AV) GORETEX  GRAFT;  Surgeon: Conrad [place], MD;  Location: [place];  Service: Vascular;  Laterality: Left;  . TUBAL LIGATION  1990's    Assessment & Plan Clinical Impression: BRIENNE LIGUORI a 61 y.o.femalewith history of ESRD--HD MWF, Hep C s/p liver transplant, CAD s/p CABG with AVR, chronic pain, PAD with chronic heel ulcer and ischemic toes. She has had progressive pain and was in process of being set for BKA. She was admitted on 04/21/16 with excruciating  pain and inability to stand. Chronic coumadin held with heparin bridge till INR normalized. She underwent lexiscan and was cleared for surgery by cardiology. She underwent L-BKA by Dr. Scot Dock on 04/24/16. Continues on IV heparin as INR subtherapeutic. Noted to have drop in H/H to 8.0/23.8 and to be transfused with 2 units PRBC. She continues to be limited by lethargy, intermittent confusion and pain affecting ADL tasks and mobility.Patient transferred to CIR on 04/30/2016.   Pt presents with decreased activity tolerance, decreased functional mobility, decreased balance, decreased cardiorespiratoy endurance, decreased attention, decreased awareness, decreased problem solving, decreased safety awareness, decreased memory and delayed processing Limiting pt's independence with leisure/community pursuits.    Leisure History/Participation  Premorbid leisure interest/current participation: Community - Building control surveyor - Pension scheme manager - Knitting/Crocheting;Crafts - Sewing Leisure Participation Style: With Family/Friends;Alone Awareness of Community Resources: Good-identify 3 post discharge leisure resources Psychosocial / Spiritual Social interaction - Mood/Behavior: Cooperative Academic librarian Appropriate for Education?: Yes Strengths/Weaknesses Patient Strengths/Abilities: Willingness to participate;Active premorbidly Patient weaknesses: Physical limitations;Minimal Premorbid Leisure Activity TR Patient demonstrates impairments in the following area(s): Edema;Endurance;Motor;Pain;Safety;Skin Integrity  Plan Rec Therapy Plan Is patient appropriate for Therapeutic Recreation?: Yes Rehab Potential: Good Treatment times per week: MIn 1 TR group/session >20 minutes during LOS Estimated Length of Stay: 10 days TR Treatment/Interventions: Adaptive equipment instruction;1:1 session;Balance/vestibular training;Functional mobility training;Community reintegration;Group participation  (Comment);Leisure education;Patient/family education;Therapeutic activities;Recreation/leisure participation;Therapeutic exercise;UE/LE Coordination activities;Wheelchair propulsion/positioning Recommendations for other services: Neuropsych  Recommendations for other services: None  Discharge Criteria: Patient will be discharged from TR if patient refuses treatment 3 consecutive times without medical reason.  If treatment goals not met, if there is a change in medical status, if patient makes no progress towards goals or if patient is discharged from hospital.  The above assessment, treatment plan, treatment alternatives and goals were discussed and mutually agreed upon: by patient  [place] 05/02/2016, 3:53 PM

## 2016-05-03 ENCOUNTER — Inpatient Hospital Stay (HOSPITAL_COMMUNITY): Payer: Medicare Other | Admitting: Speech Pathology

## 2016-05-03 ENCOUNTER — Inpatient Hospital Stay (HOSPITAL_COMMUNITY): Payer: Medicare Other | Admitting: Physical Therapy

## 2016-05-03 ENCOUNTER — Inpatient Hospital Stay (HOSPITAL_COMMUNITY): Payer: Medicare Other | Admitting: Occupational Therapy

## 2016-05-03 LAB — CBC
HEMATOCRIT: 34.6 % — AB (ref 36.0–46.0)
HEMOGLOBIN: 11.3 g/dL — AB (ref 12.0–15.0)
MCH: 29 pg (ref 26.0–34.0)
MCHC: 32.7 g/dL (ref 30.0–36.0)
MCV: 88.7 fL (ref 78.0–100.0)
Platelets: 254 10*3/uL (ref 150–400)
RBC: 3.9 MIL/uL (ref 3.87–5.11)
RDW: 17.2 % — AB (ref 11.5–15.5)
WBC: 10.4 10*3/uL (ref 4.0–10.5)

## 2016-05-03 LAB — PROTIME-INR
INR: 2.98
Prothrombin Time: 31.6 seconds — ABNORMAL HIGH (ref 11.4–15.2)

## 2016-05-03 LAB — GLUCOSE, CAPILLARY
GLUCOSE-CAPILLARY: 99 mg/dL (ref 65–99)
GLUCOSE-CAPILLARY: 140 mg/dL — AB (ref 65–99)
GLUCOSE-CAPILLARY: 139 mg/dL — AB (ref 65–99)
Glucose-Capillary: 108 mg/dL — ABNORMAL HIGH (ref 65–99)
Glucose-Capillary: 116 mg/dL — ABNORMAL HIGH (ref 65–99)

## 2016-05-03 MED ORDER — HYDROMORPHONE HCL 2 MG PO TABS
2.0000 mg | ORAL_TABLET | Freq: Once | ORAL | Status: DC
  Filled 2016-05-03 (×4): qty 1

## 2016-05-03 MED ORDER — METHOCARBAMOL 500 MG PO TABS
500.0000 mg | ORAL_TABLET | Freq: Four times a day (QID) | ORAL | Status: DC | PRN
  Administered 2016-05-07 – 2016-05-08 (×3): 500 mg via ORAL
  Filled 2016-05-03: qty 1

## 2016-05-03 MED ORDER — OXYCODONE HCL 5 MG PO TABS
ORAL_TABLET | ORAL | Status: AC
  Filled 2016-05-03: qty 1

## 2016-05-03 MED ORDER — WARFARIN SODIUM 2 MG PO TABS
2.0000 mg | ORAL_TABLET | Freq: Once | ORAL | Status: AC
  Administered 2016-05-03: 2 mg via ORAL
  Filled 2016-05-03: qty 1

## 2016-05-03 MED ORDER — FAMOTIDINE 20 MG PO TABS
20.0000 mg | ORAL_TABLET | Freq: Every day | ORAL | Status: DC | PRN
  Administered 2016-05-04 – 2016-05-11 (×2): 20 mg via ORAL
  Filled 2016-05-03 (×4): qty 1

## 2016-05-03 NOTE — Progress Notes (Signed)
Occupational Therapy Session Note  Patient Details  Name: [name] MRN: [id number] Date of Birth: 21-Apr-1955  Today's Date: 05/03/2016 OT Individual Time: 1100-1200 OT Individual Time Calculation (min): 60 min    Short Term Goals: Week 1:  OT Short Term Goal 1 (Week 1): Pt will complete all bathing sit to stand with supervision.  OT Short Term Goal 2 (Week 1): Pt will completed toilet transfers and toileting with supervision sit to stand.  OT Short Term Goal 3 (Week 1): Pt will complete LB dressing sit to stand with supervision.  OT Short Term Goal 4 (Week 1): Pt will perform standing at the sink for 2 mins with no more than supervision during completion of grooming tasks.    Skilled Therapeutic Interventions/Progress Updates:    Pt lethargic sitting in wheelchair with her sister present to begin session.  Once she was finally aroused had her roll over to the sink for bathing and dressing task.  Pt no oriented to place stating "Where am I at?".  Pt educated on place and time.  She was able to complete UB bathing with supervision except for washing the back.  LB bathing with mod assist and mod demonstrational cueing for sequencing secondary to decreased sustained attention.  She needed 2 attempts to stand in order to complete peri washing, with therapist having to assist for thoroughness as well.  Max assist for threading pants and pulling over hips this session as well as min assist for helping to donn the right sock.  Pt left in wheelchair at end of session with her sister present to finish grooming tasks.    Therapy Documentation Precautions:  Precautions Precautions: Fall Required Braces or Orthoses: Knee Immobilizer - Left Knee Immobilizer - Left: Other (comment) (on except in PT) Restrictions Weight Bearing Restrictions: Yes LLE Weight Bearing: Non weight bearing  Pain: Pain Assessment Pain Assessment: Faces Pain Score: 9  Faces Pain Scale: Hurts a little bit Pain Type:  Acute pain Pain Location: Leg Pain Orientation: Left Pain Intervention(s): Repositioned ADL: See Function Navigator for Current Functional Status.   Therapy/Group: Individual Therapy  Roopa Graver OTR/L 05/03/2016, 12:56 PM

## 2016-05-03 NOTE — Progress Notes (Signed)
[place] PHYSICAL MEDICINE & REHABILITATION     PROGRESS NOTE  Subjective/Complaints:  Pt reports ongoing pain due wounds. Although apparently slept last night  ROS: pt denies nausea, vomiting, diarrhea, cough, shortness of breath or chest pain   Objective: Vital Signs: Blood pressure (!) 158/60, pulse 76, temperature 98.3 F (36.8 C), temperature source Oral, resp. rate 16, height 5\' 4"  (1.626 m), weight 58 kg (127 lb 13.9 oz), SpO2 97 %. No results found.  Recent Labs  05/02/16 0611 05/03/16 0523  WBC 11.8* 10.4  HGB 11.8* 11.3*  HCT 35.1* 34.6*  PLT 258 254    Recent Labs  05/02/16 2031  NA 134*  K 3.9  CL 97*  GLUCOSE 146*  BUN 36*  CREATININE 5.64*  CALCIUM 8.5*   CBG (last 3)   Recent Labs  05/02/16 1705 05/03/16 0142 05/03/16 0618  GLUCAP 137* 108* 99    Wt Readings from Last 3 Encounters:  05/03/16 58 kg (127 lb 13.9 oz)  04/30/16 54.7 kg (120 lb 9.5 oz)  04/17/16 58.7 kg (129 lb 6.4 oz)    Physical Exam:  BP (!) 158/60 (BP Location: Right Arm)   Pulse 76   Temp 98.3 F (36.8 C) (Oral)   Resp 16   Ht 5\' 4"  (1.626 m)   Wt 58 kg (127 lb 13.9 oz)   SpO2 97%   BMI 21.95 kg/m  Constitutional: She appears well-developed and well-nourished. NAD. HENT: Normocephalic and atraumatic. Edentulous   Eyes: EOMI. No discharge.  Cardiovascular: RRR. No murmurs Respiratory: normal effort GI: Soft. Bowel sounds are normal.  Musculoskeletal: She exhibits edema and tenderness.  Neurological: She is alert and oriented.  Motor: B/l UE 4/5 RLE: HF 2+/5, KE 3/5, ADF/PF 4/5 (improving) LLE: HF 4/5  Skin: Skin is warm and dry. Dusky extremities unchanged Right toes ischemic appearing with boggy heel.   Stump with staples c/d/I Decubitus ulcers present Psychiatric: She has a normal mood and affect. Her behavior is normal.    Assessment/Plan: 1. Functional deficits secondary to left BKA which require 3+ hours per day of interdisciplinary therapy in a  comprehensive inpatient rehab setting. Physiatrist is providing close team supervision and 24 hour management of active medical problems listed below. Physiatrist and rehab team continue to assess barriers to discharge/monitor patient progress toward functional and medical goals.  Function:  Bathing Bathing position   Position: Sitting EOB  Bathing parts Body parts bathed by patient: Right arm, Left arm, Chest, Abdomen, Front perineal area, Buttocks, Right upper leg, Left upper leg, Right lower leg Body parts bathed by helper: Back  Bathing assist        Upper Body Dressing/Undressing Upper body dressing   What is the patient wearing?: Hospital gown     Pull over shirt/dress - Perfomed by patient: Thread/unthread right sleeve, Thread/unthread left sleeve, Put head through opening, Pull shirt over trunk          Upper body assist Assist Level: Set up   Set up : To obtain clothing/put away  Lower Body Dressing/Undressing Lower body dressing   What is the patient wearing?: Underwear, Pants, Non-skid slipper socks Underwear - Performed by patient: Thread/unthread right underwear leg, Thread/unthread left underwear leg Underwear - Performed by helper: Pull underwear up/down Pants- Performed by patient: Thread/unthread right pants leg, Thread/unthread left pants leg, Pull pants up/down Pants- Performed by helper: Thread/unthread right pants leg, Pull pants up/down Non-skid slipper socks- Performed by patient: Don/doff right sock  Lower body assist Assist for lower body dressing: Touching or steadying assistance (Pt > 75%)      Toileting Toileting Toileting activity did not occur: No continent bowel/bladder event        Toileting assist     Transfers Chair/bed transfer   Chair/bed transfer method: Lateral scoot Chair/bed transfer assist level: Moderate assist (Pt 50 - 74%/lift or lower) Chair/bed transfer assistive device: Armrests, Environmental health practitioner     Max distance: 2 Assist level: Touching or steadying assistance (Pt > 75%)   Wheelchair   Type: Manual Max wheelchair distance: 100 Assist Level: Supervision or verbal cues  Cognition Comprehension Comprehension assist level: Understands basic 75 - 89% of the time/ requires cueing 10 - 24% of the time  Expression Expression assist level: Expresses basic 75 - 89% of the time/requires cueing 10 - 24% of the time. Needs helper to occlude trach/needs to repeat words.  Social Interaction Social Interaction assist level: Interacts appropriately 75 - 89% of the time - Needs redirection for appropriate language or to initiate interaction.  Problem Solving Problem solving assist level: Solves basic 75 - 89% of the time/requires cueing 10 - 24% of the time  Memory Memory assist level: Recognizes or recalls 75 - 89% of the time/requires cueing 10 - 24% of the time    Medical Problem List and Plan: 1.  Gait abnormality, difficulty with transfers, limitation with self-care secondary to left BKA.  Cont CIR 2.  DVT Prophylaxis/Anticoagulation: Pharmaceutical: Transition from heparin ggt to Coumadin  INR therapeutic today 3. Chronic back pain/Pain Management: On gabapentin for neuropathy. Used dilaudid at home.  Continue oxycodone prn for now.   Monitor with increased activity  Lidocaine ointment for pressure sores  -suspect chronic pain/behavioral component. Did not appear to be in severe pain this am 4. Mood: Improving with decrease in pain. LCSW to follow for evaluation and support.  5. Neuropsych: This patient is capable of making decisions on her own behalf. 6. Skin/Wound Care: Routine wound care. Stump healing  -local care to pressure wounds. 7. Fluids/Electrolytes/Nutrition: Strict I/O. Serial labs with HD to monitor lytes 8. ESRD: Monitor weight daily with strict I/O. Schedule HD late afternoons to help with tolerance of activity during the day.  9. CAD s/p CABG  with AVR: Cont meds 10. 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 T2DM: Monitor BS ac/hs. Po intake poor--used 2 units levemir daily prn BS > 150. Will use SSI for elevated BS until intake is consistent.   Overall controlled 3/23 12. HTN: Monitor bid.   Labile at times, monitor in accordance with HD 13. H/o Migraines: Managed with prn medications.  14. Anemia of chronic disease: Aranesp weekly with transfusion prn.   Hb 11.8 on 3/23  Cont to monitor 15. COPD: Respiratory status stable.  16. Leucocytosis: Improving.   WBCs 11.8 on 3/23  Cont to monitor  LOS (Days) 3 A FACE TO FACE EVALUATION WAS PERFORMED  [name] T 05/03/2016 8:54 AM

## 2016-05-03 NOTE — Progress Notes (Signed)
Physical Therapy Session Note  Patient Details  Name: [name] MRN: [id number] Date of Birth: 1955/08/05  Today's Date: 05/03/2016 PT Individual Time: 0803-0858 PT Individual Time Calculation (min): 55 min   Short Term Goals: Week 1:  PT Short Term Goal 1 (Week 1): Patient will perform transfers with consistent min A.  PT Short Term Goal 2 (Week 1): Patient will ambulate 25 ft using RW with min A. PT Short Term Goal 3 (Week 1): Patient will perform sit <> stand using RW with min A.  Skilled Therapeutic Interventions/Progress Updates:      Pt received supine in bed and agreeable to PT. Supine>sit transfer with max assist and moderate cues for arousal and posture.   Sitting balance with moderate assist EOB. Pt noted to have difficulty maintaining sitting balance with posterior preference.    Transfer training performed x 3 to L and R throughout treatment with with mod assist and max cues for sequencing and proper UE placement. Pt demonstrated decreased ability to process squat pivot transfer technique on this day with increased difficulty maintaining head/hips relationship.   WC mobility 80ft x 2. Supervision assist from PT with max cues for use of LUE to prevent veer to the R.   PT Re-wraped residual limb.   Seated LE therex. LLE ROM for hip and knee flexion. Hip abduction/adduction. x10 BLE. Mod-max assist to maintain sitting balance.  Pt demonstrated Poor  Arousal throughout session and required max cues to remain engaged in activity.    Therapy Documentation Precautions:  Precautions Precautions: Fall Required Braces or Orthoses: Knee Immobilizer - Left Knee Immobilizer - Left: Other (comment) (on except in PT) Restrictions Weight Bearing Restrictions: Yes LLE Weight Bearing: Non weight bearing   Pain: 8/10 LLE at incision site  See Function Navigator for Current Functional Status.   Therapy/Group: Individual Therapy  [name] 05/03/2016, 8:58 AM

## 2016-05-03 NOTE — Progress Notes (Signed)
05/03/16 1230 nursing Patient was crying in pain re: rt amputation. MD notified Dilaudid 2mg  ordered but when RN was about to give med daughter refused med for patient because she was sleepy and going  to therapy. Daughter said they will notify RN if medicine was needed. Patient had no complaints of pain.

## 2016-05-03 NOTE — Progress Notes (Signed)
Occupational Therapy Session Note  Patient Details  Name: TYNESHA FREE MRN: [id number] Date of Birth: 19-May-1955  Today's Date: 05/03/2016 OT Individual Time: 1300-1329 OT Individual Time Calculation (min): 29 min    Short Term Goals: Week 1:  OT Short Term Goal 1 (Week 1): Pt will complete all bathing sit to stand with supervision.  OT Short Term Goal 2 (Week 1): Pt will completed toilet transfers and toileting with supervision sit to stand.  OT Short Term Goal 3 (Week 1): Pt will complete LB dressing sit to stand with supervision.  OT Short Term Goal 4 (Week 1): Pt will perform standing at the sink for 2 mins with no more than supervision during completion of grooming tasks.    Skilled Therapeutic Interventions/Progress Updates:    Pt more responsive and alert this session compared to previous session but still with flat affect.  Family present for session as therapist rolled her to the therapy gym in wheelchair.  Had her complete 2 sets of 4 minute intervals of UE ergonometer with resistance set at level 8.  She was able to complete one interval peddling forward and one in reverse with overall min assist to maintain RPMs 18-20 per minute.  Rest break of 2-3 mins completed in-between sets.  Completed second set before return back to the room with transfer into the bed with mod assist stand pivot.  Discussed pt's current level of attention and confusion today with daughter who feels it is likely related to increased pain medicines.  Pt in bed with call button in reach.  She was able to show therapist correct button to press in order to get nursing if needed.  Bed alarm in place as well.   Therapy Documentation Precautions:  Precautions Precautions: Fall Required Braces or Orthoses: Knee Immobilizer - Left Knee Immobilizer - Left: Other (comment) (on except in PT) Restrictions Weight Bearing Restrictions: Yes LLE Weight Bearing: Non weight bearing  Pain: Pain Assessment Pain  Assessment: Faces Faces Pain Scale: Hurts a little bit Pain Type: Acute pain Pain Location: Leg Pain Orientation: Left Pain Descriptors / Indicators: Discomfort Pain Onset: With Activity Pain Intervention(s): Repositioned ADL: See Function Navigator for Current Functional Status.   Therapy/Group: Individual Therapy  Terricka Onofrio  OTR/L 05/03/2016, 3:19 PM

## 2016-05-03 NOTE — Progress Notes (Signed)
ANTICOAGULATION CONSULT NOTE - Follow Up Consult  Pharmacy Consult for Warfarin Indication: AVR  Allergies  Allergen Reactions  . Acetaminophen Other (See Comments)    Liver transplant recipient   . Morphine Itching and Other (See Comments)    hallucinate  . Codeine Itching  . Mirtazapine Other (See Comments)    hallucination    Patient Measurements: Height: 5\' 4"  (162.6 cm) Weight: 127 lb 13.9 oz (58 kg) IBW/kg (Calculated) : 54.7  Vital Signs: Temp: 98.3 F (36.8 C) (03/24 0454) Temp Source: Oral (03/24 0454) BP: 158/60 (03/24 0454) Pulse Rate: 76 (03/24 0454)  Labs:  Recent Labs  05/01/16 0519 05/01/16 1802 05/02/16 0611 05/02/16 2031 05/03/16 0523  HGB  --   --  11.8*  --  11.3*  HCT  --   --  35.1*  --  34.6*  PLT  --   --  258  --  254  LABPROT 25.2*  --  33.1*  --  31.6*  INR 2.25  --  3.15  --  2.98  HEPARINUNFRC  --  <0.10* 0.49  --   --   CREATININE  --   --   --  5.64*  --     Estimated Creatinine Clearance: 9 mL/min (A) (by C-G formula based on SCr of 5.64 mg/dL (H)).   Assessment: 61 yo female with AVR is currently on therapeutic coumadin. Heparin stopped now that INR has been therapeutic for 48 hours. INR 2.98. CBC stable.  Coumadin PTA dose: 2mg  daily (per anticoag clinic note on 3/8)  Goal of Therapy:  INR 2.5-3.5 Monitor platelets by anticoagulation protocol: Yes   Plan:  - coumadin 2mg  po x 1 tonight - daily INR - Monitor S/Sx bleeding  Myer Peer Grayland Ormond), PharmD  PGY1 Pharmacy Resident Pager: [phone] 05/03/2016 8:50 AM

## 2016-05-04 LAB — CBC
HEMATOCRIT: 34.8 % — AB (ref 36.0–46.0)
HEMOGLOBIN: 11.5 g/dL — AB (ref 12.0–15.0)
MCH: 29.4 pg (ref 26.0–34.0)
MCHC: 33 g/dL (ref 30.0–36.0)
MCV: 89 fL (ref 78.0–100.0)
Platelets: 300 10*3/uL (ref 150–400)
RBC: 3.91 MIL/uL (ref 3.87–5.11)
RDW: 17.5 % — ABNORMAL HIGH (ref 11.5–15.5)
WBC: 12.4 10*3/uL — AB (ref 4.0–10.5)

## 2016-05-04 LAB — GLUCOSE, CAPILLARY
GLUCOSE-CAPILLARY: 119 mg/dL — AB (ref 65–99)
Glucose-Capillary: 109 mg/dL — ABNORMAL HIGH (ref 65–99)
Glucose-Capillary: 142 mg/dL — ABNORMAL HIGH (ref 65–99)
Glucose-Capillary: 79 mg/dL (ref 65–99)

## 2016-05-04 LAB — PROTIME-INR
INR: 3.33
Prothrombin Time: 34.6 seconds — ABNORMAL HIGH (ref 11.4–15.2)

## 2016-05-04 MED ORDER — WARFARIN SODIUM 2 MG PO TABS
2.0000 mg | ORAL_TABLET | Freq: Once | ORAL | Status: AC
  Administered 2016-05-04: 2 mg via ORAL
  Filled 2016-05-04: qty 1

## 2016-05-04 MED ORDER — DARBEPOETIN ALFA 25 MCG/0.42ML IJ SOSY
25.0000 ug | PREFILLED_SYRINGE | INTRAMUSCULAR | Status: DC
  Administered 2016-05-12: 25 ug via INTRAVENOUS
  Filled 2016-05-04 (×2): qty 0.42

## 2016-05-04 NOTE — Progress Notes (Signed)
ANTICOAGULATION CONSULT NOTE - Follow Up Consult  Pharmacy Consult for Warfarin Indication: AVR  Allergies  Allergen Reactions  . Acetaminophen Other (See Comments)    Liver transplant recipient   . Morphine Itching and Other (See Comments)    hallucinate  . Codeine Itching  . Mirtazapine Other (See Comments)    hallucination    Patient Measurements: Height: 5\' 4"  (162.6 cm) Weight: 120 lb 11.2 oz (54.7 kg) IBW/kg (Calculated) : 54.7  Vital Signs: Temp: 98.2 F (36.8 C) (03/25 0500) Temp Source: Oral (03/25 0500) BP: 138/58 (03/25 0500) Pulse Rate: 72 (03/25 0500)  Labs:  Recent Labs  05/01/16 1802  05/02/16 0611 05/02/16 2031 05/03/16 0523 05/04/16 0555  HGB  --   < > 11.8*  --  11.3* 11.5*  HCT  --   --  35.1*  --  34.6* 34.8*  PLT  --   --  258  --  254 300  LABPROT  --   --  33.1*  --  31.6* 34.6*  INR  --   --  3.15  --  2.98 3.33  HEPARINUNFRC <0.10*  --  0.49  --   --   --   CREATININE  --   --   --  5.64*  --   --   < > = values in this interval not displayed.  Estimated Creatinine Clearance: 9 mL/min (A) (by C-G formula based on SCr of 5.64 mg/dL (H)).   Assessment: 61 yo female with mechanical AVR and stated goal is 2.5-3.5, is currently on therapeutic coumadin.  -INR 3.33, therapeutic -CBC stable  Coumadin PTA dose: 2mg  daily (per anticoag clinic note on 3/8)  Goal of Therapy:  INR 2.5-3.5 Monitor platelets by anticoagulation protocol: Yes   Plan:  - coumadin 2mg  po x 1 tonight - daily INR - Monitor S/Sx bleeding  Myer Peer Grayland Ormond), PharmD  PGY1 Pharmacy Resident Pager: [phone] 05/04/2016 8:32 AM

## 2016-05-04 NOTE — Progress Notes (Signed)
[facility] Progress Note   Dialysis Orders: [name] MWF 3h 49min   2/2 bath  58kg   Hep none   R IJ cath - calcitriol 0.5 ug tiw - no ESA, last Mircera was on 2/22 - labs > last Hb 11.8, tsat 32%, pth 573, P 5  Assessment/Plan: 1. 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 Debility - on CIR since 3/21 3. ESRD MWF HD - next HD Monday 4. Anemia of CKD - 100 Aranesp ug on 3/19  hgb 11.5 stable- reduce Aranesp to 25 q Monday- hold if hgb > 11.5 5. CKD-MBD - cont meds- correct Ca 10.5 - hold phoslo for now - keep calcitriol due to ^ ipTH 6. BP/Volume -BP variable UF 1 L 3/21 and kept even 3/23 dialysis with post wt 57 - this is only 1 kg below prior wt and above prior wts during this admission ?? If accurate; would expect new edw to be 2-4 kg below prior EDW at d/c 7. Hx AVR on coumadin - INR 3.33 8. Hx liver Tx - on prograf only  9. DM on insulin BS 100s 10. Severe malnutrition alb 1.4 -regular diet/multivits/prostat - eating very little - none of breakfast today - d/w RN - minimal intake recorded 180 3/23 and 250 3/23- hard to know if depression is contributing to poor intake - consider remeron which can ^ appetite and help with depression  Myriam Jacobson, PA-C [facility] [phone] 05/04/2016,9:31 AM  LOS: 4 days   Pt seen, examined and agree w A/P as above.  [name] MD [name] pager [phone]   05/04/2016, 11:08 AM    Subjective:   Very drowsy.  Objective Vitals:   05/03/16 0956 05/03/16 1644 05/03/16 2117 05/04/16 0500  BP: 107/60 (!) 157/87 (!) 154/75 (!) 138/58  Pulse: 70 68 66 72  Resp: 20 20  20   Temp:  98 F (36.7 C)  98.2 F (36.8 C)  TempSrc:  Oral  Oral  SpO2: 98% 98%  100%  Weight:    54.7 kg (120 lb 11.2 oz)  Height:       Physical Exam General: minimally engaging - recognizes me then nods off Heart: RRR + valve click Lungs: grossly clear poor effot Abdomen: soft active BS Extremities: left BKA wrapped- tender with  movement- no sig RLE edema -  Dialysis Access: right IJ   Additional Objective Labs: Basic Metabolic Panel:  Recent Labs Lab 04/28/16 0532 04/30/16 0709 05/02/16 2031  NA 132* 132* 134*  K 3.9 4.1 3.9  CL 96* 99* 97*  CO2 24 21* 27  GLUCOSE 159* 120* 146*  BUN 38* 26* 36*  CREATININE 6.60* 5.35* 5.64*  CALCIUM 8.0* 8.2* 8.5*  PHOS  --   --  3.7   Liver Function Tests:  Recent Labs Lab 05/02/16 2031  ALBUMIN 1.4*   CBC:  Recent Labs Lab 04/29/16 0527 04/30/16 0709 05/02/16 0611 05/03/16 0523 05/04/16 0555  WBC 15.3* 13.9* 11.8* 10.4 12.4*  HGB 8.3* 8.0* 11.8* 11.3* 11.5*  HCT 24.8* 23.8* 35.1* 34.6* 34.8*  MCV 84.4 87.2 88.2 88.7 89.0  PLT 193 184 258 254 300   CBG:  Recent Labs Lab 05/03/16 0618 05/03/16 1228 05/03/16 1622 05/03/16 2125 05/04/16 0656  GLUCAP 99 140* 116* 139* 109*   Lab Results  Component Value Date   INR 3.33 05/04/2016   INR 2.98 05/03/2016   INR 3.15 05/02/2016  Medications:  . aspirin EC  81 mg Oral Daily  .  calcitRIOL  0.5 mcg Oral Q M,W,F-HD  . calcium acetate  667 mg Oral TID WC  . [START ON 05/05/2016] darbepoetin (ARANESP) injection - DIALYSIS  100 mcg Intravenous Q Mon-HD  . feeding supplement  1 Container Oral BID BM  . feeding supplement (PRO-STAT SUGAR FREE 64)  30 mL Oral BID  . gabapentin  100 mg Oral Q M,W,F-HD  . gabapentin  300 mg Oral QHS  . HYDROmorphone  2 mg Oral Once  . insulin aspart  0-5 Units Subcutaneous QHS  . insulin aspart  0-9 Units Subcutaneous TID WC  . levothyroxine  175 mcg Oral QAC breakfast  . metoprolol tartrate  25 mg Oral BID  . multivitamin  1 tablet Oral QHS  . pantoprazole  40 mg Oral Daily  . polyethylene glycol  17 g Oral Daily  . tacrolimus  2 mg Oral BID  . warfarin  2 mg Oral ONCE-1800  . Warfarin - Pharmacist Dosing Inpatient   Does not apply [phone]

## 2016-05-04 NOTE — Progress Notes (Signed)
05/04/16 1554 nursing Patient had a bowel movement ; no more complaints of chest pain.

## 2016-05-04 NOTE — Progress Notes (Addendum)
[facility] [place] PHYSICAL MEDICINE & REHABILITATION     PROGRESS NOTE  Subjective/Complaints:  After complaining of increased pain to RN yesterday, RN reports the patient eventually "forgot" and never received the one time dose of dilaudid. Pt resting comfortably this morning.  ROS: pt denies nausea, vomiting, diarrhea, cough, shortness of breath or chest pain    Objective: Vital Signs: Blood pressure (!) 138/58, pulse 72, temperature 98.2 F (36.8 C), temperature source Oral, resp. rate 20, height 5\' 4"  (1.626 m), weight 54.7 kg (120 lb 11.2 oz), SpO2 100 %. No results found.  Recent Labs  05/03/16 0523 05/04/16 0555  WBC 10.4 12.4*  HGB 11.3* 11.5*  HCT 34.6* 34.8*  PLT 254 300    Recent Labs  05/02/16 2031  NA 134*  K 3.9  CL 97*  GLUCOSE 146*  BUN 36*  CREATININE 5.64*  CALCIUM 8.5*   CBG (last 3)   Recent Labs  05/03/16 1622 05/03/16 2125 05/04/16 0656  GLUCAP 116* 139* 109*    Wt Readings from Last 3 Encounters:  05/04/16 54.7 kg (120 lb 11.2 oz)  04/30/16 54.7 kg (120 lb 9.5 oz)  04/17/16 58.7 kg (129 lb 6.4 oz)    Physical Exam:  BP (!) 138/58 (BP Location: Right Arm)   Pulse 72   Temp 98.2 F (36.8 C) (Oral)   Resp 20   Ht 5\' 4"  (1.626 m)   Wt 54.7 kg (120 lb 11.2 oz)   SpO2 100%   BMI 20.72 kg/m  Constitutional: She appears well-developed and well-nourished. NAD. HENT: Normocephalic and atraumatic. Edentulous   Eyes: EOMI. No discharge.  Cardiovascular: RRR Respiratory: normal effort GI: Soft. Bowel sounds are normal.  Musculoskeletal: She exhibits edema and tenderness.  Neurological: She is alert and oriented.  Motor: B/l UE 4/5 RLE: HF 2+/5, KE 3/5, ADF/PF 4/5 (improving) LLE: HF 4/5  Skin: Skin is warm and dry. Dusky extremities unchanged Right toes dusky  appearing with boggy heel.   Stump with staples c/d/I Decubitus ulcers present Psychiatric: flat.    Assessment/Plan: 1. Functional deficits secondary to left BKA which  require 3+ hours per day of interdisciplinary therapy in a comprehensive inpatient rehab setting. Physiatrist is providing close team supervision and 24 hour management of active medical problems listed below. Physiatrist and rehab team continue to assess barriers to discharge/monitor patient progress toward functional and medical goals.  Function:  Bathing Bathing position   Position: Wheelchair/chair at sink  Bathing parts Body parts bathed by patient: Right arm, Left arm, Chest, Right upper leg, Left upper leg, Right lower leg, Abdomen Body parts bathed by helper: Front perineal area, Buttocks, Back  Bathing assist        Upper Body Dressing/Undressing Upper body dressing   What is the patient wearing?: Pull over shirt/dress     Pull over shirt/dress - Perfomed by patient: Thread/unthread right sleeve, Thread/unthread left sleeve, Put head through opening, Pull shirt over trunk          Upper body assist Assist Level: Set up   Set up : To obtain clothing/put away  Lower Body Dressing/Undressing Lower body dressing   What is the patient wearing?: Underwear, Pants, Non-skid slipper socks Underwear - Performed by patient: Thread/unthread right underwear leg, Thread/unthread left underwear leg Underwear - Performed by helper: Pull underwear up/down Pants- Performed by patient: Thread/unthread right pants leg, Thread/unthread left pants leg, Pull pants up/down Pants- Performed by helper: Thread/unthread right pants leg, Thread/unthread left pants leg, Pull pants up/down  Non-skid slipper socks- Performed by patient: Don/doff right sock Non-skid slipper socks- Performed by helper: Don/doff right sock (left sock NA)                  Lower body assist Assist for lower body dressing: Touching or steadying assistance (Pt > 75%)      Toileting Toileting Toileting activity did not occur: No continent bowel/bladder event        Toileting assist     Transfers Chair/bed  transfer   Chair/bed transfer method: Stand pivot Chair/bed transfer assist level: Moderate assist (Pt 50 - 74%/lift or lower) Chair/bed transfer assistive device: Armrests     Locomotion Ambulation     Max distance: 2 Assist level: Touching or steadying assistance (Pt > 75%)   Wheelchair   Type: Manual Max wheelchair distance: 100 Assist Level: Supervision or verbal cues  Cognition Comprehension Comprehension assist level: Understands basic 50 - 74% of the time/ requires cueing 25 - 49% of the time  Expression Expression assist level: Expresses basic 50 - 74% of the time/requires cueing 25 - 49% of the time. Needs to repeat parts of sentences.  Social Interaction Social Interaction assist level: Interacts appropriately 50 - 74% of the time - May be physically or verbally inappropriate.  Problem Solving Problem solving assist level: Solves basic 50 - 74% of the time/requires cueing 25 - 49% of the time  Memory Memory assist level: Recognizes or recalls 50 - 74% of the time/requires cueing 25 - 49% of the time    Medical Problem List and Plan: 1.  Gait abnormality, difficulty with transfers, limitation with self-care secondary to left BKA.  Cont CIR 2.  DVT Prophylaxis/Anticoagulation: Pharmaceutical:  Coumadin  INR no supra-therapeutic---adjustment per pharmacy 3. Chronic back pain/Pain Management: On gabapentin for neuropathy. Used dilaudid at home.  Continue oxycodone prn for now.   Monitor with increased activity  Lidocaine ointment for pressure sores  -suspect chronic pain/behavioral component. Needs reassurance/distraction too  -added robaxin for muscle spasms.  4. Mood: Improving with decrease in pain. LCSW to follow for evaluation and support.  5. Neuropsych: This patient is capable of making decisions on her own behalf. 6. Skin/Wound Care: Routine wound care. Stump healing  -local care to pressure wounds. 7. Fluids/Electrolytes/Nutrition: Strict I/O. Serial labs with HD  to monitor lytes 8. ESRD: Monitor weight daily with strict I/O. Schedule HD late afternoons to help with tolerance of activity during the day.  9. CAD s/p CABG with AVR: Cont meds 10. 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 T2DM: Monitor BS ac/hs. Po intake poor--used 2 units levemir daily prn BS > 150. Will use SSI for elevated BS until intake is consistent.   Overall controlled 3/23 12. HTN: Monitor bid.   Labile at times, monitor in accordance with HD 13. H/o Migraines: Managed with prn medications.  14. Anemia of chronic disease: Aranesp weekly with transfusion prn.   Hb 11.8 on 3/23  Cont to monitor 15. COPD: Respiratory status stable.  16. Leucocytosis: no real change. .   WBCs 12.4 today and have been hovering between 10-13k  Cont to monitor clinically  LOS (Days) 4 A FACE TO FACE EVALUATION WAS PERFORMED  Grettel Rames T 05/04/2016 10:00 AM

## 2016-05-04 NOTE — Progress Notes (Signed)
1516 05/04/16 nursing RN was called to room pt c/o chest pain but on assessment patient claims it is more of a gassy pain. LBM 3/21 miralax given this am. Heart burn med given. Vital signs WNL. No signs of distress noted.Continued to monitor.

## 2016-05-04 NOTE — Plan of Care (Signed)
Problem: RH BOWEL ELIMINATION Goal: RH STG MANAGE BOWEL WITH ASSISTANCE STG Manage Bowel with  Min Assistance.  Outcome: Not Progressing Laxatives given ; LBM 3/21; pt has poor appetite

## 2016-05-05 ENCOUNTER — Inpatient Hospital Stay (HOSPITAL_COMMUNITY): Payer: Medicare Other | Admitting: Occupational Therapy

## 2016-05-05 ENCOUNTER — Inpatient Hospital Stay (HOSPITAL_COMMUNITY): Payer: Medicare Other | Admitting: Physical Therapy

## 2016-05-05 LAB — RENAL FUNCTION PANEL
Albumin: 1.5 g/dL — ABNORMAL LOW (ref 3.5–5.0)
Anion gap: 9 (ref 5–15)
BUN: 37 mg/dL — ABNORMAL HIGH (ref 6–20)
CALCIUM: 8.7 mg/dL — AB (ref 8.9–10.3)
CO2: 27 mmol/L (ref 22–32)
CREATININE: 6.13 mg/dL — AB (ref 0.44–1.00)
Chloride: 97 mmol/L — ABNORMAL LOW (ref 101–111)
GFR, EST AFRICAN AMERICAN: 8 mL/min — AB (ref >60)
GFR, EST NON AFRICAN AMERICAN: 7 mL/min — AB (ref >60)
Glucose, Bld: 97 mg/dL (ref 65–99)
Phosphorus: 3.8 mg/dL (ref 2.5–4.6)
Potassium: 3.6 mmol/L (ref 3.5–5.1)
SODIUM: 133 mmol/L — AB (ref 135–145)

## 2016-05-05 LAB — CBC
HEMATOCRIT: 39.9 % (ref 36.0–46.0)
Hemoglobin: 13.3 g/dL (ref 12.0–15.0)
MCH: 30 pg (ref 26.0–34.0)
MCHC: 33.3 g/dL (ref 30.0–36.0)
MCV: 90.1 fL (ref 78.0–100.0)
PLATELETS: 299 10*3/uL (ref 150–400)
RBC: 4.43 MIL/uL (ref 3.87–5.11)
RDW: 17.5 % — ABNORMAL HIGH (ref 11.5–15.5)
WBC: 12.5 10*3/uL — ABNORMAL HIGH (ref 4.0–10.5)

## 2016-05-05 LAB — GLUCOSE, CAPILLARY
GLUCOSE-CAPILLARY: 140 mg/dL — AB (ref 65–99)
Glucose-Capillary: 117 mg/dL — ABNORMAL HIGH (ref 65–99)
Glucose-Capillary: 94 mg/dL (ref 65–99)

## 2016-05-05 LAB — PROTIME-INR
INR: 3.41
Prothrombin Time: 35.2 seconds — ABNORMAL HIGH (ref 11.4–15.2)

## 2016-05-05 MED ORDER — WARFARIN SODIUM 2 MG PO TABS
2.0000 mg | ORAL_TABLET | Freq: Once | ORAL | Status: AC
  Administered 2016-05-05: 2 mg via ORAL
  Filled 2016-05-05: qty 1

## 2016-05-05 MED ORDER — METHOCARBAMOL 500 MG PO TABS
250.0000 mg | ORAL_TABLET | Freq: Three times a day (TID) | ORAL | Status: DC
  Administered 2016-05-05 – 2016-05-12 (×19): 250 mg via ORAL
  Filled 2016-05-05 (×23): qty 1

## 2016-05-05 MED ORDER — TRAMADOL HCL 50 MG PO TABS
25.0000 mg | ORAL_TABLET | Freq: Four times a day (QID) | ORAL | Status: DC | PRN
  Administered 2016-05-06 – 2016-05-20 (×13): 25 mg via ORAL
  Filled 2016-05-05 (×12): qty 1

## 2016-05-05 MED ORDER — HYDROMORPHONE HCL 2 MG PO TABS
2.0000 mg | ORAL_TABLET | Freq: Two times a day (BID) | ORAL | Status: DC | PRN
  Administered 2016-05-05 – 2016-05-20 (×24): 2 mg via ORAL
  Filled 2016-05-05 (×20): qty 1

## 2016-05-05 MED ORDER — HYDROMORPHONE HCL 2 MG PO TABS
ORAL_TABLET | ORAL | Status: AC
  Filled 2016-05-05: qty 1

## 2016-05-05 NOTE — Progress Notes (Signed)
Occupational Therapy Session Note  Patient Details  Name: [name] MRN: [id number] Date of Birth: 1955-07-21  Today's Date: 05/05/2016 OT Individual Time: 2423-5361 OT Individual Time Calculation (min): 71 min    Short Term Goals: Week 1:  OT Short Term Goal 1 (Week 1): Pt will complete all bathing sit to stand with supervision.  OT Short Term Goal 2 (Week 1): Pt will completed toilet transfers and toileting with supervision sit to stand.  OT Short Term Goal 3 (Week 1): Pt will complete LB dressing sit to stand with supervision.  OT Short Term Goal 4 (Week 1): Pt will perform standing at the sink for 2 mins with no more than supervision during completion of grooming tasks.    Skilled Therapeutic Interventions/Progress Updates:    OT treatment session focused on improved sit<>stand, dynamic standing balance, and kitchen access at w/c level. OT discussed home kitchen modifications and w/c positioning during simulated meal prep activity.OT demonstrated appropriate technique to access pantry, counter, and fridge. Pt required mod instructional cues for wc positioning with difficulty turning w/c in small spaces. 6 sit<>stands at counter with posterior LOB upon iniitial stand, requiring max A to descend to wc safely. Max/Mod  A progressing to min A with cues for hand placement and manual facilitation for anterior weight shift. Dynamic reaching activity in standing with overall min guard A + hip support at sink. Seated rest breaks between stands with ~2 minutes at longest standing bout. Pt then reported feeling nauseous and experienced bout of emesis. Returned to room and RN notified of nausea. Squat pivot > R back to bed w/ min A and pt left with needs met and daughter present.   Therapy Documentation Precautions:  Precautions Precautions: Fall Required Braces or Orthoses: Knee Immobilizer - Left Knee Immobilizer - Left: Other (comment) (on except in PT) Restrictions Weight Bearing  Restrictions: Yes LLE Weight Bearing: Non weight bearing Pain: Pain Assessment Pain Assessment: 0-10 Pain Score: 5  Pain Type: Phantom pain Pain Location: Leg Pain Orientation: Left Pain Descriptors / Indicators: Aching Pain Frequency: Intermittent Pain Onset: On-going Pain Intervention(s): Repositioned  See Function Navigator for Current Functional Status.   Therapy/Group: Individual Therapy  [name] 05/05/2016, 2:44 PM

## 2016-05-05 NOTE — Progress Notes (Signed)
Patient and daughter in room--relay that oxycodone ineffective in the past with sedative SE. patient crying and reporting issues with phantom pain additionally. They would like to go back to dilaudid. Discussed desensitization techniques as well as ROM to help with tightness of residual limb.  She has been having involuntary muscle spasms involving whole body.  Tylenol contraindicated with liver transplant Hx. Will resume lower dose dilaudid and monitor.

## 2016-05-05 NOTE — Progress Notes (Signed)
Orthopedic Tech Progress Note Patient Details:  [name] [birth year] [id number]  Patient ID: [name], female   DOB: 1956-01-19, 61 y.o.   MRN: [id number]   [name] 05/05/2016, 9:29 AM Called in advanced brace order; spoke with Carolyne Fiscal

## 2016-05-05 NOTE — Progress Notes (Signed)
Dialysis Orders: [name] MWF 3h 63min 2/2 bath 58kg Hep none R IJ cath Assessment/Plan: 1. 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 Debility - on CIR since 3/21 3. ESRD MWF HD - next HD today 4. BP/Volume -BP acceptable 5. Hx AVR on coumadin -  6. Hx liver Tx - on prograf only    Subjective: Interval History: [place] well with PT  Objective: Vital signs in last 24 hours: Temp:  [97.5 F (36.4 C)-98.5 F (36.9 C)] 98.5 F (36.9 C) (03/26 0900) Pulse Rate:  [62-72] 68 (03/26 0900) Resp:  [14-20] 14 (03/26 0900) BP: (140-157)/(57-62) 155/57 (03/26 0900) SpO2:  [97 %-100 %] 97 % (03/26 0500) Weight:  [54.5 kg (120 lb 3.2 oz)] 54.5 kg (120 lb 3.2 oz) (03/26 0500) Weight change: -0.227 kg (-8 oz)  Intake/Output from previous day: 03/25 0701 - 03/26 0700 In: 200 [P.O.:200] Out: -  Intake/Output this shift: Total I/O In: 240 [P.O.:240] Out: -   General appearance: alert and cooperative Resp: clear to auscultation bilaterally Cardio: regular rate and rhythm, S1, S2 normal, no murmur, click, rub or gallop GI: soft Extremities: left BKA Right IJ cath  Lab Results:  Recent Labs  05/04/16 0555 05/05/16 0702  WBC 12.4* 12.5*  HGB 11.5* 13.3  HCT 34.8* 39.9  PLT 300 299   BMET:  Recent Labs  05/02/16 2031  NA 134*  K 3.9  CL 97*  CO2 27  GLUCOSE 146*  BUN 36*  CREATININE 5.64*  CALCIUM 8.5*   No results for input(s): PTH in the last 72 hours. Iron Studies: No results for input(s): IRON, TIBC, TRANSFERRIN, FERRITIN in the last 72 hours. Studies/Results: No results found.  Scheduled: . aspirin EC  81 mg Oral Daily  . calcitRIOL  0.5 mcg Oral Q M,W,F-HD  . darbepoetin (ARANESP) injection - DIALYSIS  25 mcg Intravenous Q Mon-HD  . feeding supplement  1 Container Oral BID BM  . feeding supplement (PRO-STAT SUGAR FREE 64)  30 mL Oral BID  . gabapentin  100 mg Oral Q M,W,F-HD  . gabapentin  300 mg Oral QHS  . HYDROmorphone  2 mg Oral Once  . insulin aspart  0-5 Units Subcutaneous  QHS  . insulin aspart  0-9 Units Subcutaneous TID WC  . levothyroxine  175 mcg Oral QAC breakfast  . metoprolol tartrate  25 mg Oral BID  . multivitamin  1 tablet Oral QHS  . pantoprazole  40 mg Oral Daily  . polyethylene glycol  17 g Oral Daily  . tacrolimus  2 mg Oral BID  . warfarin  2 mg Oral ONCE-1800  . Warfarin - Pharmacist Dosing Inpatient   Does not apply q1800    LOS: 5 days   Minami Arriaga C 05/05/2016,10:34 AM

## 2016-05-05 NOTE — Progress Notes (Signed)
ANTICOAGULATION CONSULT NOTE - Follow Up Consult  Pharmacy Consult for coumadin Indication: AVR  Allergies  Allergen Reactions  . Acetaminophen Other (See Comments)    Liver transplant recipient   . Morphine Itching and Other (See Comments)    hallucinate  . Codeine Itching  . Mirtazapine Other (See Comments)    hallucination    Patient Measurements: Height: 5\' 4"  (162.6 cm) Weight: 120 lb 3.2 oz (54.5 kg) IBW/kg (Calculated) : 54.7 Heparin Dosing Weight:   Vital Signs: Temp: 97.5 F (36.4 C) (03/26 0500) Temp Source: Oral (03/26 0500) BP: 157/60 (03/26 0500) Pulse Rate: 72 (03/26 0500)  Labs:  Recent Labs  05/02/16 2031  05/03/16 0523 05/04/16 0555 05/05/16 0702  HGB  --   < > 11.3* 11.5* 13.3  HCT  --   --  34.6* 34.8* 39.9  PLT  --   --  254 300 299  LABPROT  --   --  31.6* 34.6* 35.2*  INR  --   --  2.98 3.33 3.41  CREATININE 5.64*  --   --   --   --   < > = values in this interval not displayed.  Estimated Creatinine Clearance: 9 mL/min (A) (by C-G formula based on SCr of 5.64 mg/dL (H)).   Medications:  Scheduled:  . aspirin EC  81 mg Oral Daily  . calcitRIOL  0.5 mcg Oral Q M,W,F-HD  . darbepoetin (ARANESP) injection - DIALYSIS  25 mcg Intravenous Q Mon-HD  . feeding supplement  1 Container Oral BID BM  . feeding supplement (PRO-STAT SUGAR FREE 64)  30 mL Oral BID  . gabapentin  100 mg Oral Q M,W,F-HD  . gabapentin  300 mg Oral QHS  . HYDROmorphone  2 mg Oral Once  . insulin aspart  0-5 Units Subcutaneous QHS  . insulin aspart  0-9 Units Subcutaneous TID WC  . levothyroxine  175 mcg Oral QAC breakfast  . metoprolol tartrate  25 mg Oral BID  . multivitamin  1 tablet Oral QHS  . pantoprazole  40 mg Oral Daily  . polyethylene glycol  17 g Oral Daily  . tacrolimus  2 mg Oral BID  . Warfarin - Pharmacist Dosing Inpatient   Does not apply q1800   Infusions:    Assessment: 61 yo female with AVR is currently on therapeutic coumadin.  INR today is  3.41.  Goal of Therapy:  INR 2.5-3.5 Monitor platelets by anticoagulation protocol: Yes   Plan:  - coumadin 2 mg po x1 - INR in am  Greogry Goodwyn, Tsz-Yin 05/05/2016,8:21 AM

## 2016-05-05 NOTE — Progress Notes (Signed)
[facility] PHYSICAL MEDICINE & REHABILITATION     PROGRESS NOTE  Subjective/Complaints:  Right heel burning, in Prevalon boot has pressure relief mattress, feels better out of boot  ROS: pt denies nausea, vomiting, diarrhea, cough, shortness of breath or chest pain    Objective: Vital Signs: Blood pressure (!) 157/60, pulse 72, temperature 97.5 F (36.4 C), temperature source Oral, resp. rate 18, height 5\' 4"  (1.626 m), weight 54.5 kg (120 lb 3.2 oz), SpO2 97 %. No results found.  Recent Labs  05/03/16 0523 05/04/16 0555  WBC 10.4 12.4*  HGB 11.3* 11.5*  HCT 34.6* 34.8*  PLT 254 300    Recent Labs  05/02/16 2031  NA 134*  K 3.9  CL 97*  GLUCOSE 146*  BUN 36*  CREATININE 5.64*  CALCIUM 8.5*   CBG (last 3)   Recent Labs  05/04/16 1735 05/04/16 2151 05/05/16 0645  GLUCAP 142* 79 117*    Wt Readings from Last 3 Encounters:  05/05/16 54.5 kg (120 lb 3.2 oz)  04/30/16 54.7 kg (120 lb 9.5 oz)  04/17/16 58.7 kg (129 lb 6.4 oz)    Physical Exam:  BP (!) 157/60 (BP Location: Right Arm)   Pulse 72   Temp 97.5 F (36.4 C) (Oral)   Resp 18   Ht 5\' 4"  (1.626 m)   Wt 54.5 kg (120 lb 3.2 oz)   SpO2 97%   BMI 20.63 kg/m  Constitutional: She appears well-developed and well-nourished. NAD. HENT: Normocephalic and atraumatic. Edentulous   Eyes: EOMI. No discharge.  Cardiovascular: RRR Respiratory: normal effort GI: Soft. Bowel sounds are normal.  Musculoskeletal: She exhibits edema and tenderness.  Neurological: She is alert and oriented.  Motor: B/l UE 4/5 RLE: HF 2+/5, KE 3/5, ADF/PF 4/5 (improving) LLE: HF 4/5  Skin: Skin is warm and dry. Dusky extremities unchanged Right toes dusky,with boggy heel.   Stump with staples c/d/I Decubitus ulcers present Psychiatric: flat.    Assessment/Plan: 1. Functional deficits secondary to left BKA which require 3+ hours per day of interdisciplinary therapy in a comprehensive inpatient rehab setting. Physiatrist is  providing close team supervision and 24 hour management of active medical problems listed below. Physiatrist and rehab team continue to assess barriers to discharge/monitor patient progress toward functional and medical goals.  Function:  Bathing Bathing position   Position: Wheelchair/chair at sink  Bathing parts Body parts bathed by patient: Right arm, Left arm, Chest, Right upper leg, Left upper leg, Right lower leg, Abdomen Body parts bathed by helper: Front perineal area, Buttocks, Back  Bathing assist        Upper Body Dressing/Undressing Upper body dressing   What is the patient wearing?: Pull over shirt/dress     Pull over shirt/dress - Perfomed by patient: Thread/unthread right sleeve, Thread/unthread left sleeve, Put head through opening, Pull shirt over trunk          Upper body assist Assist Level: Set up   Set up : To obtain clothing/put away  Lower Body Dressing/Undressing Lower body dressing   What is the patient wearing?: Underwear, Pants, Non-skid slipper socks Underwear - Performed by patient: Thread/unthread right underwear leg, Thread/unthread left underwear leg Underwear - Performed by helper: Pull underwear up/down Pants- Performed by patient: Thread/unthread right pants leg, Thread/unthread left pants leg, Pull pants up/down Pants- Performed by helper: Thread/unthread right pants leg, Thread/unthread left pants leg, Pull pants up/down Non-skid slipper socks- Performed by patient: Don/doff right sock Non-skid slipper socks- Performed by helper: Don/doff  right sock (left sock NA)                  Lower body assist Assist for lower body dressing: Touching or steadying assistance (Pt > 75%)      Toileting Toileting Toileting activity did not occur: No continent bowel/bladder event        Toileting assist     Transfers Chair/bed transfer   Chair/bed transfer method: Stand pivot Chair/bed transfer assist level: Moderate assist (Pt 50 - 74%/lift  or lower) Chair/bed transfer assistive device: Armrests     Locomotion Ambulation     Max distance: 2 Assist level: Touching or steadying assistance (Pt > 75%)   Wheelchair   Type: Manual Max wheelchair distance: 100 Assist Level: Supervision or verbal cues  Cognition Comprehension Comprehension assist level: Understands basic 50 - 74% of the time/ requires cueing 25 - 49% of the time  Expression Expression assist level: Expresses basic 50 - 74% of the time/requires cueing 25 - 49% of the time. Needs to repeat parts of sentences.  Social Interaction Social Interaction assist level: Interacts appropriately 50 - 74% of the time - May be physically or verbally inappropriate.  Problem Solving Problem solving assist level: Solves basic 50 - 74% of the time/requires cueing 25 - 49% of the time  Memory Memory assist level: Recognizes or recalls 50 - 74% of the time/requires cueing 25 - 49% of the time    Medical Problem List and Plan: 1.  Gait abnormality, difficulty with transfers, limitation with self-care secondary to left BKA.  Cont CIR PT, OT 2.  DVT Prophylaxis/Anticoagulation: Pharmaceutical:  Coumadin  INR 3.33 on 3/25---adjustment per pharmacy 3. Chronic back pain/Pain Management: On gabapentin for neuropathy. Used dilaudid at home.  Continue oxycodone prn for now.   Monitor with increased activity  Lidocaine ointment for pressure sores  -suspect chronic pain/behavioral component. Needs reassurance/distraction too  -added robaxin for muscle spasms.  4. Mood: Improving with decrease in pain. LCSW to follow for evaluation and support.  5. Neuropsych: This patient is capable of making decisions on her own behalf. 6. Skin/Wound Care: Routine wound care. Stump healing  -pressure sore RIght heel 7. Fluids/Electrolytes/Nutrition: Strict I/O. Serial labs with HD to monitor lytes 8. ESRD: Monitor weight daily with strict I/O. Schedule HD late afternoons to help with tolerance of  activity during the day.  9. CAD s/p CABG with AVR: Cont meds 10. 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 T2DM: Monitor BS ac/hs. Po intake poor--used 2 units levemir daily prn BS > 150. Will use SSI for elevated BS until intake is consistent.   Overall controlled 3/26 12. HTN: Monitor bid.   Labile at times, monitor in accordance with HD 13. H/o Migraines: Managed with prn medications.  14. Anemia of chronic disease: Aranesp weekly with transfusion prn.   Hb 11.8 on 3/23  Cont to monitor 15. COPD: Respiratory status stable.  16. Leucocytosis: no real change. .   WBCs 12.4 3/25  afebrile  LOS (Days) 5 A FACE TO FACE EVALUATION WAS PERFORMED  Charlett Blake 05/05/2016 7:27 AM

## 2016-05-05 NOTE — Progress Notes (Signed)
Occupational Therapy Session Note  Patient Details  Name: [name] MRN: [id number] Date of Birth: 1955-11-29  Today's Date: 05/05/2016 OT Individual Time: 1100-1200 OT Individual Time Calculation (min): 60 min    Short Term Goals: Week 1:  OT Short Term Goal 1 (Week 1): Pt will complete all bathing sit to stand with supervision.  OT Short Term Goal 2 (Week 1): Pt will completed toilet transfers and toileting with supervision sit to stand.  OT Short Term Goal 3 (Week 1): Pt will complete LB dressing sit to stand with supervision.  OT Short Term Goal 4 (Week 1): Pt will perform standing at the sink for 2 mins with no more than supervision during completion of grooming tasks.       Skilled Therapeutic Interventions/Progress Updates:    Pt seen this session for functional mobility and ADL training with a focus on activity tolerance. Pt received in recliner with daughter in the room. Pt moaning and crying about L limb phantom pain and general distress about limb loss. With much encouragement, pt did participate but needed encouragement throughout the session to continue participating.  She needs max cues to fully push up with B hands on arm rests before reaching for the walker. When she does this she can stand up with steadying A.  Cues to reach back before sitting.  Practiced sit to stand from recliner and then stand pivot with RW to w/c for dressing.  She can advance R foot forward but has great difficulty moving back to move to sitting surface.  After dressing and donning KI, much of the session was focused on practicing moving self forward and back with RW, arm push ups 5x for 3 sets and AROM of UE for strengthening.  Encouraged daughter to have pt practice chair push ups and AROM along with massage of L limb throughout the day.  Pt resting in w/c with daughter in the room with the pt.     Therapy Documentation Precautions:  Precautions Precautions: Fall Required Braces or Orthoses:  Knee Immobilizer - Left Knee Immobilizer - Left: Other (comment) (on except in PT) Restrictions Weight Bearing Restrictions: Yes LLE Weight Bearing: Non weight bearing    Vital Signs: Therapy Vitals Temp: 98.5 F (36.9 C) Temp Source: Oral Pulse Rate: 68 Resp: 14 BP: (!) 155/57 Pain: Pain Assessment Pain Score: 7  in L limb, premedicated before therapy session ADL:    See Function Navigator for Current Functional Status.   Therapy/Group: Individual Therapy  SAGUIER,JULIA 05/05/2016, 12:38 PM

## 2016-05-05 NOTE — Progress Notes (Signed)
Physical Therapy Session Note  Patient Details  Name: [name] MRN: [id number] Date of Birth: [birth year]  Today's Date: 05/05/2016 PT Individual Time: 0900-1000 PT Individual Time Calculation (min): 60 min   Short Term Goals: Week 1:  PT Short Term Goal 1 (Week 1): Patient will perform transfers with consistent min A.  PT Short Term Goal 2 (Week 1): Patient will ambulate 25 ft using RW with min A. PT Short Term Goal 3 (Week 1): Patient will perform sit <> stand using RW with min A.  Skilled Therapeutic Interventions/Progress Updates:    Pt in bed upon arrival and agreeable to therapy. Bed Mobility: supine>sit in bed, min assist with trunk and cues for sequence. Later pt at supervision level from mat table. Transfers: Sit<>stand performed with min assist, cues for hand placement initially performed from w/c and mat table. Gait: ambulating 8 ft X3 using rw. Decreasing Rt LE stability noted prompting sitting rest. W/C: pt propelling w/c at supervision level, needing occasional cues for safety. Reviewing parts management with pt and daughter. Exercise: Lt knee extension stretch with emphasis on keeping knee straight. QS, SAQ, SLR bilateral 1X10.  Pt in w/c in room with daughter present and all needs in reach. Knee immobilizer applied and encouraged knee extension with pt and daughter.   Therapy Documentation Precautions:  Precautions Precautions: Fall Required Braces or Orthoses: Knee Immobilizer - Left Knee Immobilizer - Left: Other (comment) (on except in PT) Restrictions Weight Bearing Restrictions: Yes LLE Weight Bearing: Non weight bearing Pain: Pt reports having occasional shooting phantom pain into LLE as well as low back and rt heel pain. Monitored during session.   See Function Navigator for Current Functional Status.   Therapy/Group: Individual Therapy  Linard Millers, PT 05/05/2016, 12:45 PM

## 2016-05-05 NOTE — Progress Notes (Signed)
Dialysis treatment terminated with 16 minutes remaining d/t phantom pain - AMA form signed.  1400 mL ultrafiltrated.  900 mL net fluid removal.  Patient status unchanged. Lung sounds diminished to ausculation in all fields. Generalized edema. Cardiac: NSR.  Cleansed RIJ catheter with chlorhexidine.  Disconnected lines and flushed ports with saline per protocol.  Ports locked with heparin and capped per protocol.    Report given to bedside, RN Jina.

## 2016-05-06 ENCOUNTER — Inpatient Hospital Stay (HOSPITAL_COMMUNITY): Payer: Medicare Other | Admitting: *Deleted

## 2016-05-06 ENCOUNTER — Inpatient Hospital Stay (HOSPITAL_COMMUNITY): Payer: Medicare Other | Admitting: Occupational Therapy

## 2016-05-06 ENCOUNTER — Inpatient Hospital Stay (HOSPITAL_COMMUNITY): Payer: Medicare Other | Admitting: Physical Therapy

## 2016-05-06 DIAGNOSIS — R52 Pain, unspecified: Secondary | ICD-10-CM

## 2016-05-06 LAB — CBC
HCT: 43.8 % (ref 36.0–46.0)
Hemoglobin: 14.5 g/dL (ref 12.0–15.0)
MCH: 29.8 pg (ref 26.0–34.0)
MCHC: 33.1 g/dL (ref 30.0–36.0)
MCV: 89.9 fL (ref 78.0–100.0)
PLATELETS: 243 10*3/uL (ref 150–400)
RBC: 4.87 MIL/uL (ref 3.87–5.11)
RDW: 17.7 % — ABNORMAL HIGH (ref 11.5–15.5)
WBC: 11.2 10*3/uL — ABNORMAL HIGH (ref 4.0–10.5)

## 2016-05-06 LAB — GLUCOSE, CAPILLARY
GLUCOSE-CAPILLARY: 137 mg/dL — AB (ref 65–99)
GLUCOSE-CAPILLARY: 129 mg/dL — AB (ref 65–99)
Glucose-Capillary: 90 mg/dL (ref 65–99)
Glucose-Capillary: 128 mg/dL — ABNORMAL HIGH (ref 65–99)

## 2016-05-06 LAB — PROTIME-INR
INR: 3.1
PROTHROMBIN TIME: 32.6 s — AB (ref 11.4–15.2)

## 2016-05-06 MED ORDER — WARFARIN SODIUM 2 MG PO TABS
2.0000 mg | ORAL_TABLET | Freq: Once | ORAL | Status: AC
  Administered 2016-05-06: 2 mg via ORAL
  Filled 2016-05-06: qty 1

## 2016-05-06 MED ORDER — PREGABALIN 25 MG PO CAPS
25.0000 mg | ORAL_CAPSULE | Freq: Every day | ORAL | Status: DC
  Administered 2016-05-06 – 2016-05-07 (×2): 25 mg via ORAL
  Filled 2016-05-06 (×2): qty 1

## 2016-05-06 NOTE — Progress Notes (Signed)
Recreational Therapy Session Note  Patient Details  Name: [name] MRN: [id number] Date of Birth: [birth year] Today's Date: 05/06/2016  Pain: no c/o  Skilled Therapeutic Interventions/Progress Updates: Session focused on activity tolerance, w/c mobility using BUE's, dynamic sitting & standing balance while playing Wii Just Dance.  [name] daughter & her husband present & observing session/encouraging pt.  Pt propelled w/c with supervision using BUE's.  Pt performed stand pivot transfers with RW with Min assist.  Pt stood for Wii Just Dance with min-max assist without UE support.  Pt fatigued in standing needing seated rest break.  Seated EOM, pt continue with dance activity with supervision. Therapy/Group: Co-Treatment  Sarika Baldini 05/06/2016, 3:13 PM

## 2016-05-06 NOTE — Progress Notes (Signed)
[place] PHYSICAL MEDICINE & REHABILITATION     PROGRESS NOTE  Subjective/Complaints: Likes PRAFO better than Prevalon  ROS: pt denies nausea, vomiting, diarrhea, cough, shortness of breath or chest pain    Objective: Vital Signs: Blood pressure (!) 170/80, pulse 75, temperature 98.2 F (36.8 C), temperature source Oral, resp. rate 20, height 5\' 4"  (1.626 m), weight 53.1 kg (117 lb), SpO2 92 %. No results found.  Recent Labs  05/04/16 0555 05/05/16 0702  WBC 12.4* 12.5*  HGB 11.5* 13.3  HCT 34.8* 39.9  PLT 300 299    Recent Labs  05/05/16 1549  NA 133*  K 3.6  CL 97*  GLUCOSE 97  BUN 37*  CREATININE 6.13*  CALCIUM 8.7*   CBG (last 3)   Recent Labs  05/05/16 1209 05/05/16 2056 05/06/16 0647  GLUCAP 140* 94 90    Wt Readings from Last 3 Encounters:  05/06/16 53.1 kg (117 lb)  04/30/16 54.7 kg (120 lb 9.5 oz)  04/17/16 58.7 kg (129 lb 6.4 oz)    Physical Exam:  BP (!) 170/80 (BP Location: Right Arm)   Pulse 75   Temp 98.2 F (36.8 C) (Oral)   Resp 20   Ht 5\' 4"  (1.626 m)   Wt 53.1 kg (117 lb)   SpO2 92%   BMI 20.08 kg/m  Constitutional: She appears well-developed and well-nourished. NAD. HENT: Normocephalic and atraumatic. Edentulous   Eyes: EOMI. No discharge.  Cardiovascular: RRR Respiratory: normal effort GI: Soft. Bowel sounds are normal.  Musculoskeletal: She exhibits edema and tenderness.  Neurological: She is alert and oriented.  Motor: B/l UE 4/5 RLE: HF 2+/5, KE 3/5, ADF/PF 4/5 (improving) LLE: HF 4/5  Skin: Skin is warm and dry. Dusky extremities unchanged Right toes dusky,with boggy heel.   Stump with staples c/d/I Decubitus ulcers present Psychiatric: flat.    Assessment/Plan: 1. Functional deficits secondary to left BKA which require 3+ hours per day of interdisciplinary therapy in a comprehensive inpatient rehab setting. Physiatrist is providing close team supervision and 24 hour management of active medical problems  listed below. Physiatrist and rehab team continue to assess barriers to discharge/monitor patient progress toward functional and medical goals.  Function:  Bathing Bathing position   Position: Wheelchair/chair at sink  Bathing parts Body parts bathed by patient: Right arm, Left arm, Chest, Right upper leg, Left upper leg, Right lower leg, Abdomen Body parts bathed by helper: Front perineal area, Buttocks, Back  Bathing assist Assist Level: Touching or steadying assistance(Pt > 75%) (supervision UB, assist with stand for peri area and buttocks)      Upper Body Dressing/Undressing Upper body dressing   What is the patient wearing?: Pull over shirt/dress     Pull over shirt/dress - Perfomed by patient: Thread/unthread right sleeve, Thread/unthread left sleeve, Put head through opening, Pull shirt over trunk          Upper body assist Assist Level: Set up   Set up : To obtain clothing/put away  Lower Body Dressing/Undressing Lower body dressing   What is the patient wearing?: Underwear, Pants Underwear - Performed by patient: Thread/unthread right underwear leg, Thread/unthread left underwear leg, Pull underwear up/down Underwear - Performed by helper: Pull underwear up/down Pants- Performed by patient: Thread/unthread right pants leg, Thread/unthread left pants leg, Pull pants up/down Pants- Performed by helper: Thread/unthread right pants leg, Thread/unthread left pants leg, Pull pants up/down Non-skid slipper socks- Performed by patient: Don/doff right sock Non-skid slipper socks- Performed by helper: Don/doff right  sock (left sock NA)                  Lower body assist Assist for lower body dressing: Touching or steadying assistance (Pt > 75%)      Toileting Toileting Toileting activity did not occur:  (does not void, no bm today)        Toileting assist     Transfers Chair/bed transfer   Chair/bed transfer method: Stand pivot Chair/bed transfer assist level:  Touching or steadying assistance (Pt > 75%) Chair/bed transfer assistive device: Armrests, Medical sales representative     Max distance: 8 ft Assist level: Touching or steadying assistance (Pt > 75%)   Wheelchair   Type: Manual Max wheelchair distance: 100 ft Assist Level: Supervision or verbal cues  Cognition Comprehension Comprehension assist level: Understands basic 50 - 74% of the time/ requires cueing 25 - 49% of the time  Expression Expression assist level: Expresses basic 75 - 89% of the time/requires cueing 10 - 24% of the time. Needs helper to occlude trach/needs to repeat words.  Social Interaction Social Interaction assist level: Interacts appropriately 50 - 74% of the time - May be physically or verbally inappropriate.  Problem Solving Problem solving assist level: Solves basic 50 - 74% of the time/requires cueing 25 - 49% of the time  Memory Memory assist level: Recognizes or recalls 50 - 74% of the time/requires cueing 25 - 49% of the time    Medical Problem List and Plan: 1.  Gait abnormality, difficulty with transfers, limitation with self-care secondary to left BKA.  Cont CIR PT, OT- team conf in am 2.  DVT Prophylaxis/Anticoagulation: Pharmaceutical:  Coumadin  INR 3.33 on 3/25---adjustment per pharmacy 3. Chronic back pain/Pain Management: On gabapentin for neuropathy add Lyrica. Used dilaudid at home. Dilaudid  Monitor with increased activity  Lidocaine ointment for pressure sores  -suspect chronic pain/behavioral component. Neuropsych recommended  -added robaxin for muscle spasms.  4. Mood: Improving with decrease in pain. LCSW to follow for evaluation and support.  5. Neuropsych: This patient is capable of making decisions on her own behalf. 6. Skin/Wound Care: Routine wound care. Stump healing  -pressure sore RIght heel- PRAFO 7. Fluids/Electrolytes/Nutrition: Strict I/O. Serial labs with HD to monitor lytes 8. ESRD: Monitor weight daily with strict  I/O. Schedule HD late afternoons to help with tolerance of activity during the day.  9. CAD s/p CABG with AVR: Cont meds 10. 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 T2DM: Monitor BS ac/hs. Po intake poor--used 2 units levemir daily prn BS > 150. Will use SSI for elevated BS until intake is consistent.   Overall controlled 3/26 12. HTN: Monitor bid.   Labile at times, monitor in accordance with HD 13. H/o Migraines: Managed with prn medications.  14. Anemia of chronic disease: Aranesp weekly with transfusion prn.   Hb 13.3 on 3/26  Improved- Aranesp per nephro 15. COPD: Respiratory status stable.  16. Leucocytosis: no real change. .   WBCs 12.5 3/26  afebrile  LOS (Days) 6 A FACE TO FACE EVALUATION WAS PERFORMED  Charlett Blake 05/06/2016 7:34 AM

## 2016-05-06 NOTE — Progress Notes (Signed)
Dialysis Orders:[name] MWF 3h 32min 2/2 bath 58kg Hep none R IJ cath Assessment/Plan: 1. 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 Debility - on CIR since 3/21 3. ESRD MWF HD - next HD Wed 4. BP/Volume -BP acceptable 5. Hx AVR on coumadin -  6. Hx liver Tx - on prograf only   Subjective: Interval History: Tol HD yesterday, -1400cc  Objective: Vital signs in last 24 hours: Temp:  [97.3 F (36.3 C)-98.2 F (36.8 C)] 98.2 F (36.8 C) (03/27 0505) Pulse Rate:  [61-108] 75 (03/27 0505) Resp:  [16-61] 20 (03/27 0505) BP: (137-188)/(63-80) 170/80 (03/27 0505) SpO2:  [91 %-98 %] 92 % (03/27 0505) Weight:  [50.2 kg (110 lb 10.7 oz)-53.1 kg (117 lb)] 53.1 kg (117 lb) (03/27 0505) Weight change: -3.422 kg (-7 lb 8.7 oz)  Intake/Output from previous day: 03/26 0701 - 03/27 0700 In: 530 [P.O.:530] Out: 900  Intake/Output this shift: Total I/O In: 120 [P.O.:120] Out: -   General appearance: alert and cooperative  Lungs clear Cor RRR Ext no edema;left BKA  Lab Results:  Recent Labs  05/04/16 0555 05/05/16 0702  WBC 12.4* 12.5*  HGB 11.5* 13.3  HCT 34.8* 39.9  PLT 300 299   BMET:  Recent Labs  05/05/16 1549  NA 133*  K 3.6  CL 97*  CO2 27  GLUCOSE 97  BUN 37*  CREATININE 6.13*  CALCIUM 8.7*   No results for input(s): PTH in the last 72 hours. Iron Studies: No results for input(s): IRON, TIBC, TRANSFERRIN, FERRITIN in the last 72 hours. Studies/Results: No results found.  Scheduled: . aspirin EC  81 mg Oral Daily  . calcitRIOL  0.5 mcg Oral Q M,W,F-HD  . darbepoetin (ARANESP) injection - DIALYSIS  25 mcg Intravenous Q Mon-HD  . feeding supplement  1 Container Oral BID BM  . feeding supplement (PRO-STAT SUGAR FREE 64)  30 mL Oral BID  . gabapentin  100 mg Oral Q M,W,F-HD  . gabapentin  300 mg Oral QHS  . HYDROmorphone  2 mg Oral Once  . insulin aspart  0-5 Units Subcutaneous QHS  . insulin aspart  0-9 Units Subcutaneous TID WC  . levothyroxine  175 mcg Oral QAC breakfast  .  methocarbamol  250 mg Oral TID  . metoprolol tartrate  25 mg Oral BID  . multivitamin  1 tablet Oral QHS  . pantoprazole  40 mg Oral Daily  . polyethylene glycol  17 g Oral Daily  . pregabalin  25 mg Oral Daily  . tacrolimus  2 mg Oral BID  . Warfarin - Pharmacist Dosing Inpatient   Does not apply q1800    LOS: 6 days   Randy Whitener C 05/06/2016,9:55 AM

## 2016-05-06 NOTE — Progress Notes (Signed)
ANTICOAGULATION CONSULT NOTE - Follow Up Consult  Pharmacy Consult for coumadin Indication: AVR  Allergies  Allergen Reactions  . Acetaminophen Other (See Comments)    Liver transplant recipient   . Morphine Itching and Other (See Comments)    hallucinate  . Codeine Itching  . Mirtazapine Other (See Comments)    hallucination    Patient Measurements: Height: 5\' 4"  (162.6 cm) Weight: 117 lb (53.1 kg) IBW/kg (Calculated) : 54.7 Heparin Dosing Weight:   Vital Signs: Temp: 98.2 F (36.8 C) (03/27 0505) Temp Source: Oral (03/27 0505) BP: 170/80 (03/27 0505) Pulse Rate: 75 (03/27 0505)  Labs:  Recent Labs  05/04/16 0555 05/05/16 0702 05/05/16 1549  HGB 11.5* 13.3  --   HCT 34.8* 39.9  --   PLT 300 299  --   LABPROT 34.6* 35.2*  --   INR 3.33 3.41  --   CREATININE  --   --  6.13*    Estimated Creatinine Clearance: 8.1 mL/min (A) (by C-G formula based on SCr of 6.13 mg/dL (H)).   Medications:  Scheduled:  . aspirin EC  81 mg Oral Daily  . calcitRIOL  0.5 mcg Oral Q M,W,F-HD  . darbepoetin (ARANESP) injection - DIALYSIS  25 mcg Intravenous Q Mon-HD  . feeding supplement  1 Container Oral BID BM  . feeding supplement (PRO-STAT SUGAR FREE 64)  30 mL Oral BID  . gabapentin  100 mg Oral Q M,W,F-HD  . gabapentin  300 mg Oral QHS  . HYDROmorphone  2 mg Oral Once  . insulin aspart  0-5 Units Subcutaneous QHS  . insulin aspart  0-9 Units Subcutaneous TID WC  . levothyroxine  175 mcg Oral QAC breakfast  . methocarbamol  250 mg Oral TID  . metoprolol tartrate  25 mg Oral BID  . multivitamin  1 tablet Oral QHS  . pantoprazole  40 mg Oral Daily  . polyethylene glycol  17 g Oral Daily  . pregabalin  25 mg Oral Daily  . tacrolimus  2 mg Oral BID  . Warfarin - Pharmacist Dosing Inpatient   Does not apply q1800   Infusions:    Assessment: 61 yo female with AVR is currently on therapeutic coumadin.  INR today is 3.1.  Goal of Therapy:  INR 2.5-3.5 Monitor platelets  by anticoagulation protocol: Yes   Plan:  - coumadin 2 mg po x1 - INR in am  Kista Robb, Tsz-Yin 05/06/2016,8:29 AM

## 2016-05-06 NOTE — Progress Notes (Signed)
Physical Therapy Session Note  Patient Details  Name: [name] MRN: [id number] Date of Birth: October 09, 1955  Today's Date: 05/06/2016 PT Individual Time: 1000-1100 and 1300-1415 PT Individual Time Calculation (min): 60 min and 75 min  Short Term Goals: Week 1:  PT Short Term Goal 1 (Week 1): Patient will perform transfers with consistent min A.  PT Short Term Goal 2 (Week 1): Patient will ambulate 25 ft using RW with min A. PT Short Term Goal 3 (Week 1): Patient will perform sit <> stand using RW with min A.  Skilled Therapeutic Interventions/Progress Updates:   Treatment 1: Patient in bed with L residual limb elevated on pillows, re-educated patient on importance of hip/knee muscle extensibility and keeping hip/knee extended. Patient requesting to don underwear and pants. Doffed brief via rolling using rail and patient threaded underwear and pants in supine with min cues for threading RLE before LLE. Patient sat EOB using rail with HOB flat with supervision. Sit <> stand from EOB with min A and patient pulled underwear/pants over hips with max A for standing balance with single UE support. Performed squat pivot transfer bed > wheelchair <> mat table with min A and verbal cues for hand placement and head hips relationship. Patient propelled wheelchair using BUE to and from gym with supervision and increased time for steering. Patient required max cues for directing wheelchair setup and able to position wheelchair next to mat with increased time and max cues. Sit <> stand transfers using RW with min A and verbal cues for pushing up from surface/reaching back with 1 UE. Patient able to maintain standing with single or no UE support while engaging in Wii dance with min-max A for standing balance, x 5 min + 3 min. As patient fatigued in standing she continued dance activity from seated position for generalized strengthening and activity tolerance. Gait training using RW in straight path x 12 ft with min  A and cues for sequencing and safe use of AD. Patient left sitting in wheelchair to return to room with daughter.   Treatment 2: Patient in wheelchair without KI donned and L knee bent on amputee support pad. Reinforced education regarding importance of maintaining knee extension at rest and need for KI when not in therapy. Patient propelled wheelchair using BUE to and from gym with supervision. Patient required max cues for wheelchair setup and doffing leg rests. LLE therex for strengthening and ROM, x 20 each exercise: supine quad sets with 5 sec hold and glute sets with 5 sec hold, SLR, sidelying hip flexion/extension with max verbal/tactile cues, sidelying hip abduction with max verbal/tactiles cues to prevent compensatory movements. Patient transitioned to prone and able to tolerate prolonged prone hip flexor stretch x 5 min. Instructed in prone R hip extension x 5 before exercise terminated due to pain. Patient able to transfer prone > supine > sitting EOM with increased time and verbal cues for sequencing with supervision. Seated BLE LAQ x 20. After exercises, patient demonstrating improved L knee extension to neutral in supine/sitting. Patient requested to return to bed. Performed squat pivot transfer with min A with verbal/visual cues for head hips relationship. Patient transferred sit > supine with supervision and left semi reclined in bed with KI donned and all needs within reach. Patient intermittently tearful throughout session due to unrated LLE pain, repositioned and provided emotional support throughout treatment.   Therapy Documentation Precautions:  Precautions Precautions: Fall Required Braces or Orthoses: Knee Immobilizer - Left Knee Immobilizer - Left: Other (  comment) (on except in PT) Restrictions Weight Bearing Restrictions: Yes LLE Weight Bearing: Non weight bearing Pain: Pain Assessment Pain Assessment: 0-10 Pain Score: 8  Pain Type: Surgical pain Pain Location: Leg Pain  Orientation: Left Pain Frequency: Intermittent Pain Onset: On-going Pain Intervention(s): Repositioned, rest   See Function Navigator for Current Functional Status.   Therapy/Group: Individual Therapy  Billy Rocco, Murray Hodgkins 05/06/2016, 11:03 AM

## 2016-05-06 NOTE — Progress Notes (Signed)
Occupational Therapy Session Note  Patient Details  Name: [name] MRN: [id number] Date of Birth: 09/06/1955  Today's Date: 05/06/2016 OT Individual Time: 5974-1638 OT Individual Time Calculation (min): 66 min    Short Term Goals: Week 1:  OT Short Term Goal 1 (Week 1): Pt will complete all bathing sit to stand with supervision.  OT Short Term Goal 2 (Week 1): Pt will completed toilet transfers and toileting with supervision sit to stand.  OT Short Term Goal 3 (Week 1): Pt will complete LB dressing sit to stand with supervision.  OT Short Term Goal 4 (Week 1): Pt will perform standing at the sink for 2 mins with no more than supervision during completion of grooming tasks.    Skilled Therapeutic Interventions/Progress Updates:    OT treatment session focused on improved sit<>stand, modified bathing/dressing, dynamic standing balance, and L residual limb wrapping. Pt propelled wc to the sink and required min cues and set-up A  to initiate UB bathing task. Sit<>stand with Max A, and manual facilitation for anterior weight shift. Pt progressing to Mod A sit<> stand with improved technique. Mod A to maintain standing balance with unilateral UE support while reaching to wash buttocks, then pull pants over hips-posterior lean with increased activity demand.  Educated pt on L residual limb wrapping with demonstration. Pt practiced with towel roll with instructional cues and mod A.  Will need continued practice with technique and daughters need to be educated.  Pt left seated in wc at end of session with chair alarm on and needs met.   Therapy Documentation Precautions:  Precautions Precautions: Fall Required Braces or Orthoses: Knee Immobilizer - Left Knee Immobilizer - Left: Other (comment) (on except in PT) Restrictions Weight Bearing Restrictions: Yes LLE Weight Bearing: Non weight bearing Pain: Pain Assessment Pain Assessment: 0-10 Pain Score: 5  Pain Type: Surgical pain Pain  Location: Leg Pain Orientation: Left Pain Frequency: Intermittent Pain Onset: On-going Pain Intervention(s) Repositioned  See Function Navigator for Current Functional Status.   Therapy/Group: Individual Therapy  [name] 05/06/2016, 11:55 AM

## 2016-05-07 ENCOUNTER — Inpatient Hospital Stay (HOSPITAL_COMMUNITY): Payer: Medicare Other | Admitting: Physical Therapy

## 2016-05-07 ENCOUNTER — Encounter (HOSPITAL_COMMUNITY): Payer: Medicare Other | Admitting: Psychology

## 2016-05-07 ENCOUNTER — Inpatient Hospital Stay (HOSPITAL_COMMUNITY): Payer: Medicare Other | Admitting: Occupational Therapy

## 2016-05-07 DIAGNOSIS — F4323 Adjustment disorder with mixed anxiety and depressed mood: Secondary | ICD-10-CM

## 2016-05-07 LAB — RENAL FUNCTION PANEL
ANION GAP: 11 (ref 5–15)
Albumin: 1.5 g/dL — ABNORMAL LOW (ref 3.5–5.0)
BUN: 35 mg/dL — ABNORMAL HIGH (ref 6–20)
CALCIUM: 8.4 mg/dL — AB (ref 8.9–10.3)
CO2: 27 mmol/L (ref 22–32)
CREATININE: 5.67 mg/dL — AB (ref 0.44–1.00)
Chloride: 99 mmol/L — ABNORMAL LOW (ref 101–111)
GFR, EST AFRICAN AMERICAN: 8 mL/min — AB (ref >60)
GFR, EST NON AFRICAN AMERICAN: 7 mL/min — AB (ref >60)
Glucose, Bld: 111 mg/dL — ABNORMAL HIGH (ref 65–99)
PHOSPHORUS: 5 mg/dL — AB (ref 2.5–4.6)
Potassium: 3.2 mmol/L — ABNORMAL LOW (ref 3.5–5.1)
SODIUM: 137 mmol/L (ref 135–145)

## 2016-05-07 LAB — CBC
HCT: 35.3 % — ABNORMAL LOW (ref 36.0–46.0)
HEMATOCRIT: 35.8 % — AB (ref 36.0–46.0)
HEMOGLOBIN: 12 g/dL (ref 12.0–15.0)
Hemoglobin: 11.7 g/dL — ABNORMAL LOW (ref 12.0–15.0)
MCH: 29.7 pg (ref 26.0–34.0)
MCH: 29.6 pg (ref 26.0–34.0)
MCHC: 33.5 g/dL (ref 30.0–36.0)
MCHC: 33.1 g/dL (ref 30.0–36.0)
MCV: 88.6 fL (ref 78.0–100.0)
MCV: 89.4 fL (ref 78.0–100.0)
Platelets: 270 10*3/uL (ref 150–400)
Platelets: 298 10*3/uL (ref 150–400)
RBC: 4.04 MIL/uL (ref 3.87–5.11)
RBC: 3.95 MIL/uL (ref 3.87–5.11)
RDW: 17.8 % — AB (ref 11.5–15.5)
RDW: 17.3 % — ABNORMAL HIGH (ref 11.5–15.5)
WBC: 11.2 10*3/uL — AB (ref 4.0–10.5)
WBC: 12.1 10*3/uL — ABNORMAL HIGH (ref 4.0–10.5)

## 2016-05-07 LAB — GLUCOSE, CAPILLARY
GLUCOSE-CAPILLARY: 130 mg/dL — AB (ref 65–99)
Glucose-Capillary: 122 mg/dL — ABNORMAL HIGH (ref 65–99)

## 2016-05-07 LAB — PROTIME-INR
INR: 4.41
Prothrombin Time: 43.3 seconds — ABNORMAL HIGH (ref 11.4–15.2)

## 2016-05-07 MED ORDER — LIDOCAINE HCL (PF) 1 % IJ SOLN: 5.0000 mL | INTRAMUSCULAR | Status: DC | PRN

## 2016-05-07 MED ORDER — BOOST / RESOURCE BREEZE PO LIQD
1.0000 | Freq: Three times a day (TID) | ORAL | Status: DC
  Administered 2016-05-07 – 2016-05-19 (×23): 1 via ORAL

## 2016-05-07 MED ORDER — LIDOCAINE-PRILOCAINE 2.5-2.5 % EX CREA: 1.0000 "application " | TOPICAL_CREAM | CUTANEOUS | Status: DC | PRN

## 2016-05-07 MED ORDER — HEPARIN SODIUM (PORCINE) 1000 UNIT/ML DIALYSIS: 20.0000 [IU]/kg | INTRAMUSCULAR | Status: DC | PRN

## 2016-05-07 MED ORDER — PENTAFLUOROPROP-TETRAFLUOROETH EX AERO: 1.0000 "application " | INHALATION_SPRAY | CUTANEOUS | Status: DC | PRN

## 2016-05-07 MED ORDER — SODIUM CHLORIDE 0.9 % IV SOLN: 100.0000 mL | INTRAVENOUS | Status: DC | PRN

## 2016-05-07 MED ORDER — HEPARIN SODIUM (PORCINE) 1000 UNIT/ML DIALYSIS: 1000.0000 [IU] | INTRAMUSCULAR | Status: DC | PRN

## 2016-05-07 MED ORDER — ALTEPLASE 2 MG IJ SOLR: 2.0000 mg | Freq: Once | INTRAMUSCULAR | Status: DC | PRN

## 2016-05-07 MED ORDER — PREGABALIN 50 MG PO CAPS
50.0000 mg | ORAL_CAPSULE | Freq: Every day | ORAL | Status: DC
  Administered 2016-05-08 – 2016-05-12 (×5): 50 mg via ORAL
  Filled 2016-05-07 (×5): qty 1

## 2016-05-07 NOTE — Significant Event (Signed)
CRITICAL VALUE ALERT  Critical value received:  PT 43.3 and INR 4.41  Date of notification:  05/07/16  Time of notification:  2233  Critical value read back:Yes.    Nurse who received alert:  Junius Creamer  MD notified (1st page):  Silvestre Mesi, PA  Time of first page:  0800  MD notified (2nd page):  Time of second page:  Responding MD:  Silvestre Mesi, PA  Time MD responded:  0800

## 2016-05-07 NOTE — Progress Notes (Signed)
Occupational Therapy Session Note  Patient Details  Name: [name] MRN: [id number] Date of Birth: January 22, 1956  Today's Date: 05/07/2016 OT Individual Time: 1300-1400 OT Individual Time Calculation (min): 60 min    Short Term Goals: Week 1:  OT Short Term Goal 1 (Week 1): Pt will complete all bathing sit to stand with supervision.  OT Short Term Goal 2 (Week 1): Pt will completed toilet transfers and toileting with supervision sit to stand.  OT Short Term Goal 3 (Week 1): Pt will complete LB dressing sit to stand with supervision.  OT Short Term Goal 4 (Week 1): Pt will perform standing at the sink for 2 mins with no more than supervision during completion of grooming tasks.    Skilled Therapeutic Interventions/Progress Updates:    Upon entering the room, pt seated in wheelchair with family member present in the room. Pt reports extreme fatigue from prior sessions but requesting to wash at sink. Pt performed UB self care from wheelchair with set up A and steady assistance for balance with standing to wash buttocks and peri area. Pt needing steady assistance for standing balance during LB clothing management. Pt transferring from wheelchair >bed with RW and min A for stand pivot transfer. OT provided pt and caregiver with theraband for B UE strengthening exercises. More education to continue as pt required rest from fatigue. Bed rails up and bed alarm activated. Call bell within reach.   Therapy Documentation Precautions:  Precautions Precautions: Fall Required Braces or Orthoses: Knee Immobilizer - Left Knee Immobilizer - Left: Other (comment) (on except in PT) Restrictions Weight Bearing Restrictions: Yes LLE Weight Bearing: Non weight bearing General:   Vital Signs: Therapy Vitals Temp: 98 F (36.7 C) Temp Source: Oral Pulse Rate: 68 Resp: 18 BP: (!) 160/52 Patient Position (if appropriate): Lying Oxygen Therapy SpO2: 95 % O2 Device: Not Delivered Pain: Pain  Assessment Pain Assessment: No/denies pain Pain Score: 0-No pain ADL:   Exercises:   Other Treatments:    See Function Navigator for Current Functional Status.   Therapy/Group: Individual Therapy  [name] 05/07/2016, 8:46 PM

## 2016-05-07 NOTE — Progress Notes (Signed)
Dialysis Orders:[name] MWF 3h 34min 2/2 bath 58kg Hep none R IJ cath Assessment/Plan: 1. 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 Debility - on CIR since 3/21, improved 3. ESRD MWF HD - next HD today 4. BP/Volume -BP acceptable 5. Hx AVR on coumadin -  6. Hx liver Tx - on prograf only   Subjective: Interval History: Working well with therapy  Objective: Vital signs in last 24 hours: Temp:  [98.1 F (36.7 C)-98.3 F (36.8 C)] 98.1 F (36.7 C) (03/28 0453) Pulse Rate:  [64-67] 67 (03/28 0453) Resp:  [16-18] 16 (03/28 0453) BP: (150-153)/(56-62) 150/56 (03/28 0453) SpO2:  [90 %-97 %] 97 % (03/28 0453) Weight:  [53.5 kg (118 lb)] 53.5 kg (118 lb) (03/28 0453) Weight change: 2.424 kg (5 lb 5.5 oz)  Intake/Output from previous day: 03/27 0701 - 03/28 0700 In: 410 [P.O.:410] Out: -  Intake/Output this shift: Total I/O In: 120 [P.O.:120] Out: -   General appearance: alert and cooperative Resp: clear to auscultation bilaterally Cardio: regular rate and rhythm, S1, S2 normal, no murmur, click, rub or gallop Extremities: left BKA  Lab Results:  Recent Labs  05/06/16 1246 05/07/16 0607  WBC 11.2* 11.2*  HGB 14.5 12.0  HCT 43.8 35.8*  PLT 243 270   BMET:  Recent Labs  05/05/16 1549  NA 133*  K 3.6  CL 97*  CO2 27  GLUCOSE 97  BUN 37*  CREATININE 6.13*  CALCIUM 8.7*   No results for input(s): PTH in the last 72 hours. Iron Studies: No results for input(s): IRON, TIBC, TRANSFERRIN, FERRITIN in the last 72 hours. Studies/Results: No results found.  Scheduled: . aspirin EC  81 mg Oral Daily  . calcitRIOL  0.5 mcg Oral Q M,W,F-HD  . darbepoetin (ARANESP) injection - DIALYSIS  25 mcg Intravenous Q Mon-HD  . feeding supplement  1 Container Oral BID BM  . feeding supplement (PRO-STAT SUGAR FREE 64)  30 mL Oral BID  . gabapentin  300 mg Oral QHS  . HYDROmorphone  2 mg Oral Once  . insulin aspart  0-5 Units Subcutaneous QHS  . insulin aspart  0-9 Units Subcutaneous TID WC  .  levothyroxine  175 mcg Oral QAC breakfast  . methocarbamol  250 mg Oral TID  . metoprolol tartrate  25 mg Oral BID  . multivitamin  1 tablet Oral QHS  . pantoprazole  40 mg Oral Daily  . polyethylene glycol  17 g Oral Daily  . [START ON 05/08/2016] pregabalin  50 mg Oral Daily  . tacrolimus  2 mg Oral BID  . Warfarin - Pharmacist Dosing Inpatient   Does not apply q1800    LOS: 7 days   [name] C 05/07/2016,1:33 PM

## 2016-05-07 NOTE — Progress Notes (Signed)
   VASCULAR SURGERY ASSESSMENT & PLAN:   POD 13 L AKA  Doing well in rehab   SUBJECTIVE:   No complaints  PHYSICAL EXAM:   Vitals:   05/05/16 2014 05/06/16 0505 05/06/16 1442 05/07/16 0453  BP: (!) 160/68 (!) 170/80 (!) 153/62 (!) 150/56  Pulse: (!) 108 75 64 67  Resp: 16 20 18 16   Temp: 98.2 F (36.8 C) 98.2 F (36.8 C) 98.3 F (36.8 C) 98.1 F (36.7 C)  TempSrc: Oral Oral Oral Oral  SpO2: 91% 92% 90% 97%  Weight:  117 lb (53.1 kg)  118 lb (53.5 kg)  Height:       Dressing dry Knee immobilizer in place  LABS:   Lab Results  Component Value Date   WBC 11.2 (H) 05/07/2016   HGB 12.0 05/07/2016   HCT 35.8 (L) 05/07/2016   MCV 88.6 05/07/2016   PLT 270 05/07/2016   Lab Results  Component Value Date   INR 4.41 (HH) 05/07/2016   CBG (last 3)   Recent Labs  05/06/16 1641 05/06/16 2159 05/07/16 0625  GLUCAP 137* 129* 122*    PROBLEM LIST:    Active Problems:   S/P unilateral BKA (below knee amputation), left (HCC)   Unilateral complete BKA, left, initial encounter (HCC)   Chronic midline low back pain without sciatica   ESRD on dialysis ([place])   Type 2 diabetes mellitus with complication, with long-term current use of insulin (HCC)   Supratherapeutic INR   Anemia of chronic disease   CURRENT MEDS:   . aspirin EC  81 mg Oral Daily  . calcitRIOL  0.5 mcg Oral Q M,W,F-HD  . darbepoetin (ARANESP) injection - DIALYSIS  25 mcg Intravenous Q Mon-HD  . feeding supplement  1 Container Oral BID BM  . feeding supplement (PRO-STAT SUGAR FREE 64)  30 mL Oral BID  . gabapentin  300 mg Oral QHS  . HYDROmorphone  2 mg Oral Once  . insulin aspart  0-5 Units Subcutaneous QHS  . insulin aspart  0-9 Units Subcutaneous TID WC  . levothyroxine  175 mcg Oral QAC breakfast  . methocarbamol  250 mg Oral TID  . metoprolol tartrate  25 mg Oral BID  . multivitamin  1 tablet Oral QHS  . pantoprazole  40 mg Oral Daily  . polyethylene glycol  17 g Oral Daily  . [START ON  05/08/2016] pregabalin  50 mg Oral Daily  . tacrolimus  2 mg Oral BID  . Warfarin - Pharmacist Dosing Inpatient   Does not apply [place]: [phone] Office: [phone] 05/07/2016

## 2016-05-07 NOTE — Progress Notes (Signed)
Physical Therapy Session Note  Patient Details  Name: [name] MRN: [id number] Date of Birth: Dec 03, 1955  Today's Date: 05/07/2016 PT Individual Time: [phone] PT Individual Time Calculation (min): 60 min   Short Term Goals: Week 1:  PT Short Term Goal 1 (Week 1): Patient will perform transfers with consistent min A.  PT Short Term Goal 2 (Week 1): Patient will ambulate 25 ft using RW with min A. PT Short Term Goal 3 (Week 1): Patient will perform sit <> stand using RW with min A.  Skilled Therapeutic Interventions/Progress Updates:   Patient in bed upon arrival with daughter Sharee Pimple present to observe therapy. Patient with questions regarding discharge home, asking when she would learn to use wheelchair, etc. and required total A for awareness of purpose of physical therapy and goals that she has been using the wheelchair daily to prepare for DC home. Throughout session, patient educated on purpose of transfer training, gait training, wheelchair training, and exercises for pre-prosthetic training. Patient transferred supine > sit EOB using rail with supervision to perform UB dressing with setup and LB dressing with sit > stand min A to pull up pants over hips after threading BLE with min cues for sequencing. Performed stand pivot transfers using RW x 2 from bed to wheelchair and mat table to wheelchair with min A and verbal cues for sequencing AD. Patient requires max faded to supervision cues to recall safe hand placement with sit <> stand transfers. Patient propelled wheelchair to and from gym using BUE with supervision. Daughter provided return demonstration for donning/doffing leg rests with increased time. Gait training using RW x 18 ft including turn to sit on mat with min A overall, WC follow for safety. BLE therex for strengthening/ROM: seated LAQ x 20 each LE, standing using RW L hip flexion x 10 and L hip extension x 10. Discussed home setup, DME needs, f/u HHPT with patient and  daughter and daughter provided with home measurement sheet. Daughter also reports that ramp for home entry to be built at beginning of next week. Patient left sitting in wheelchair with daughter to return to room.    Therapy Documentation Precautions:  Precautions Precautions: Fall Required Braces or Orthoses: Knee Immobilizer - Left Knee Immobilizer - Left: Other (comment) (on except in PT) Restrictions Weight Bearing Restrictions: Yes LLE Weight Bearing: Non weight bearing Pain: Pain Assessment Pain Assessment: Faces Faces Pain Scale: No hurt  See Function Navigator for Current Functional Status.   Therapy/Group: Individual Therapy  Brilyn Tuller, Murray Hodgkins 05/07/2016, 10:26 AM

## 2016-05-07 NOTE — Patient Care Conference (Signed)
Inpatient RehabilitationTeam Conference and Plan of Care Update Date: 05/07/2016   Time: 10:10 AM    Patient Name: [name]      Medical Record Number: [id number]  Date of Birth: [birth year] Sex: Female         Room/Bed: 4W16C/4W16C-01 Payor Info: Payor: MEDICARE / Plan: MEDICARE PART A AND B / Product Type: *No Product type* /    Admitting Diagnosis: L BKA  Admit Date/Time:  04/30/2016  3:50 PM Admission Comments: No comment available   Primary Diagnosis:  <principal problem not specified> Principal Problem: <principal problem not specified>  Patient Active Problem List   Diagnosis Date Noted  . Supratherapeutic INR   . Anemia of chronic disease   . Chronic midline low back pain without sciatica   . ESRD on dialysis ([place])   . Type 2 diabetes mellitus with complication, with long-term current use of insulin ([place])   . S/P unilateral BKA (below knee amputation), left ([place]) 04/30/2016  . Unilateral complete BKA, left, initial encounter ([place]) 04/30/2016  . Abnormality of gait   . Coronary artery disease involving coronary bypass graft of native heart without angina pectoris   . Benign essential HTN   . History of migraine   . Leukocytosis   . Critical lower limb ischemia 04/22/2016  . Foot pain 04/21/2016  . Fever   . Advance care planning   . Goals of care, counseling/discussion   . Palliative care by specialist   . Sepsis ([place])   . Hypotension   . Claudication of left lower extremity ([place])   . Elevated troponin 03/05/2016  . Pressure injury of skin, stage 2 11/27/2015  . H/O unilateral nephrectomy 07/06/2015  . Paroxysmal SVT (supraventricular tachycardia) ([place]) 06/22/2014  . Complications due to renal dialysis device, implant, and graft 05/11/2014  . Acute blood loss anemia 03/31/2014  . Renal dialysis device, implant, or graft complication 30/86/5784  . Acute hyperkalemia   . S/P liver transplant ([place])   . Chronic hepatitis C without hepatic coma ([place])   .  Pulmonary edema 01/03/2014  . Respiratory failure ([place]) 01/03/2014  . Long term current use of anticoagulant therapy 04/22/2013  . Chronic anticoagulation w/ coumadin, goal INR 2.5-3.0 w/ AVR 04/14/2013  . Tobacco abuse 02/28/2013  . COPD (chronic obstructive pulmonary disease) ([place]) 02/21/2013  . Chronic combined systolic and diastolic congestive heart failure ([place]) 02/06/2013  . Acute respiratory failure ([place]) 11/02/2012  . Acute pulmonary edema ([place]) 11/02/2012  . Acute on chronic systolic CHF (congestive heart failure) ([place]) 11/02/2012  . Dyslipidemia-LDL 104, not on statin with Hx of liver transplant 07/30/2012  . CAD (coronary artery disease), native coronary artery 07/27/2012  . History of prosthetic aortic valve replacement   . End stage renal disease on dialysis ([place]) 02/27/2011  . Hypothyroidism 06/25/2006  . Type 2 diabetes mellitus with chronic kidney disease ([place]) 06/25/2006  . GERD 06/25/2006  . Anemia due to chronic renal failure    . Hypertensive heart disease     Expected Discharge Date: Expected Discharge Date: 05/13/16  Team Members Present: Physician leading conference: Ilean Skill, PsyD;Dr. Alysia Penna Social Worker Present: Ovidio Kin, LCSW Nurse Present: Elliot Cousin, RN PT Present: Carney Living, PT OT Present: Willeen Cass, OT PPS Coordinator present : Daiva Nakayama, RN, CRRN     Current Status/Progress Goal Weekly Team Focus  Medical   left BKA, phantom pain an issue,   maintain med stability  pain meds, trial stump shrinker   Bowel/Bladder  Anuric, HD M.W.F.,  continent bowel  BM QD, QOD.  No s/s infection, retention. Laxatives as needed.  Monitor   Swallow/Nutrition/ Hydration             ADL's   Min A overall  Mod I/supervision  pt/family education, functoinal transfers, standing balance, modified bathing/dressing, simple iADL at w/c level   Mobility   min A  supervision-min A  functional transfers, ambulation, standing  balance, LLE strengthening/ROM, pain management, pt/family education   Communication   min A sit to stand and stand pivot with RW, min A for balance with LB dressing and bathing  S toileting, toilet transfer to BSC, standing with RW, bathing, LB dressing; mod I UB dressing, grooming, simple meal prep w/c level, and housekeeping w/c level  ADL training, pain tolerance, pt/family education, psychosocial support   Safety/Cognition/ Behavioral Observations  No unsafe behaviors, calls for assist, anticipates needs.  Increased safety awareness, no falls, injury this admission.  Monitor, bed alarm.   Pain   C/O phantom and incisional pain/discomfort.  Dilaudid 2mg  q12hrs. PRN, scheduled robaxin effective.  Managed at goal 2/10.  Monitor, educate re: phantom pain, relief.   Skin   Stage 2 at coccyx, santyl...Rt. heel, great toe boggy, no open areas, cool to touch. Incision with scant serous drainage.  St. 2 resolving at discharge. No further breakdown, injury. Pt. knowledgable re: pressure release, control.  Continue dressing changes, boosting, education.      *See Care Plan and progress notes for long and short-term goals.  Barriers to Discharge: Debility chronic due to CKD, Liver failure    Possible Resolutions to Barriers:  cont rehab    Discharge Planning/Teaching Needs:  Home with daughter's assisting her, daughter's have been here to observe in therapies. Neuro-psych to see today for coping      Team Discussion:  Goals supervision-min assist level, tends to lose her balance posterioraly. Neuro-psych saw and pt seems to be brighter. Back on home pain meds according to MD. Will order stump shrinker. Daughter here daily participating in therapies with pt. Ramp being built on Tuesday  Revisions to Treatment Plan:  DC 4/3   Continued Need for Acute Rehabilitation Level of Care: The patient requires daily medical management by a physician with specialized training in physical medicine and  rehabilitation for the following conditions: Daily direction of a multidisciplinary physical rehabilitation program to ensure safe treatment while eliciting the highest outcome that is of practical value to the patient.: Yes Daily medical management of patient stability for increased activity during participation in an intensive rehabilitation regime.: Yes Daily analysis of laboratory values and/or radiology reports with any subsequent need for medication adjustment of medical intervention for : Neurological problems;Diabetes problems;Post surgical problems  Syenna Nazir, Gardiner Rhyme 05/09/2016, 10:40 AM

## 2016-05-07 NOTE — Progress Notes (Signed)
Social Work Patient ID: [name], female   DOB: 04/25/1955, 61 y.o.   MRN: [id number]  Met with pt and daughter-[name] who is here to attend therapies with her. Discussed team conference goals supervision-min assist level Level and target discharge date 4/3. Both feel this will allow her to ready her goals. Daughter has school that day so will be her after 1:00 pm to pick her up. Ramp being built on Monday. Will resume [facility] services since pt Had this before. Work on discharge needs.

## 2016-05-07 NOTE — Progress Notes (Signed)
Physical Therapy Session Note  Patient Details  Name: [name] MRN: [id number] Date of Birth: 1955/10/19  Today's Date: 05/07/2016 PT Individual Time: 1034-1204 PT Individual Time Calculation (min): 90 min   Short Term Goals: Week 1:  PT Short Term Goal 1 (Week 1): Patient will perform transfers with consistent min A.  PT Short Term Goal 2 (Week 1): Patient will ambulate 25 ft using RW with min A. PT Short Term Goal 3 (Week 1): Patient will perform sit <> stand using RW with min A.  Skilled Therapeutic Interventions/Progress Updates:    Pt up in w/c upon arrival, reports feeling tired but willing to attempt session. Pt propelling w/c from room to gym with supervision. Cues provided for even push and how to correct for drifting. Reviewed how to remove legrests. Bed Mobility: supine<>sit on mat table with supervision. Transfers: sit<>stand with min assist, cues for hand placement. Reinforcing full turn prior to sitting and not hurrying when fatigued. Gait: ambulating 22 ftX1, 36 ft X1, 20 ft X1 - using rw and min guard assistance. Pt with 1 loss of balance when pushing rw too far forward. Exercise: Rt LE; QS, SAQ, SLR, hip abd/add, hip extension into ball (resistance applied as tolerated, cues for full motion). Balance: static standing in rw, seated reaching at variable angles, beanbag toss/catch with racket. Pt propelling w/c back to room, up in chair with KI on, daughter present, and all needs in reach.    Therapy Documentation Precautions:  Precautions Precautions: Fall Required Braces or Orthoses: Knee Immobilizer - Left Knee Immobilizer - Left: Other (comment) (on except in PT) Restrictions Weight Bearing Restrictions: Yes LLE Weight Bearing: Non weight bearing   Pain: Reports phantom pain, intermittent during session. Monitored during session and modified activity as needed.   See Function Navigator for Current Functional Status.   Therapy/Group: Individual  Therapy  Linard Millers, PT 05/07/2016, 12:16 PM

## 2016-05-07 NOTE — Consult Note (Signed)
Neuropsychological Consultation   Patient:   [name]   DOB:   11/26/1955  MR Number:  [id number]  Location:  [facility] [address] [id number] [place] [address] Dept: [place]: [phone]           Date of Service:   05/07/2016  Start Time:   8 AM End Time:   8:55 AM  Provider/Observer:          Billing Code/Service: [id number]  Chief Complaint:    No chief complaint on file.   Reason for Service:  Mashonda Broski was referred for a neuropsychological consultation due to difficulties adjusting to and dealing with her below knee amputation.  The patient had liver transplant and Hep C sometime around 2011.  She has ESRD as well as diabetes.  She has continued with lethargy, pain and reports of phantom pain sensations.    Current Status:  The patient is having some difficulties with adjusting to Below knee amputation.  She has long history of serious medical issues including Liver Transplant, ESRD, treated Hep C and diabetes.  Her reports of confusion have improved and she was oriented x4 today.    Reliability of Information: Information from medical records, consultation with treating staff and 1 hour clinical interview with patient.    Behavioral Observation: SACOYA MCGOURTY  presents as a 61 y.o.-year-old Right African American Female who appeared her stated age. her dress was Appropriate and she was Well Groomed and her manners were Appropriate to the situation.  her participation was indicative of Appropriate behaviors.  There were physical disabilities noted.  she displayed an appropriate level of cooperation and motivation.     Interactions:    Active Appropriate and Attentive  Attention:   The patient's attention was a little below normal expectations but she was able to stay focused and on task. and attention span appeared shorter than expected for age  Memory:   within normal  limits; recent and remote memory intact  Visuo-spatial:  within normal limits  Speech (Volume):  low  Speech:   normal; normal  Thought Process:  Coherent  Though Content:  WNL; not suicidal  Orientation:   person, place, time/date and situation  Judgment:   Fair  Planning:   Fair  Affect:    Depressed and Flat  Mood:    Depressed  Insight:   Fair  Intelligence:   Lower end of average range  Medical History:   Past Medical History:  Diagnosis Date  . Anemia    takes Folic Acid daily  . Anxiety   . Aortic stenosis   . Arthritis    "left hand, back" (08/30/2012)  . Asthma   . CAD (coronary artery disease)   . CAD (coronary artery disease) Jan. 2015   Cath: 20% LAD, 50% D1; s/p LIMA-LAD  . Chest pain 03/06/2015  . Chronic back pain   . Chronic bronchitis ([place])    "q yr w/season changes" (08/30/2012)  . Chronic constipation    takes MIralax and Colace daily  . COPD (chronic obstructive pulmonary disease) ([place])   . Depression    takes Cymbalta for "severe" depression  . End stage renal disease on dialysis [facility]) 02/27/2011   Kidneys shut down at time of liver transplant in Sept 2011 at [facility] in [place], she has been on HD ever since.  Dialyzes at [facility] HD on TTS schedule.  Had L forearm graft  used 10 months then removed Dec 2012 due to suspected infection.  A right upper arm AVG was placed Dec 2012 but she developed steal symptoms acutely and it was ligated the same day.  Never had an AV fistula due to small veins.  Now has L thigh AVG put in Jan 2013, has not clotted to date.    [name] GERD (gastroesophageal reflux disease)    takes Omeprazole daily  . Headache    "at least monthly" (08/30/2012)  . Hepatitis C   . History of blood transfusion    "several" (08/30/2012)  . Hypertension    takes Metoprolol and Lisinopril daily, sees Dr Bea Graff  . Hypothyroidism    takes Synthroid daily  . Migraine    "last migraine was in 2013" (08/30/2012)  . Neuromuscular disorder  ([place])    carpal tunnel in right hand  . Obesity   . Peripheral vascular disease (HCC) hands and legs  . Pneumonia    "today and several times before" (08/30/2012)  . 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 S/P unilateral BKA (below knee amputation), left ([place]) 04/30/2016  . SVT (supraventricular tachycardia) ([place]) 06/09/14  . Tobacco abuse   . Type II diabetes mellitus (HCC)    Levemir 2units daily if > 150         Sexual History:   History  Sexual Activity  . Sexual activity: No    Family Med/Psych History:  Family History  Problem Relation Age of Onset  . Cancer Mother   . Diabetes Mother   . Hypertension Mother   . Stroke Mother   . Cancer Father   . Anesthesia problems Neg Hx   . Hypotension Neg Hx   . Malignant hyperthermia Neg Hx   . Pseudochol deficiency Neg Hx     Risk of Suicide/Violence: low The patient denies any SI or HI  Impression/DX:  The patient is a 62 year old female recovering from Below the knee amputation. She is also experiencing end-stage renal disease and receiving dialysis 3 days a week.   She has had difficulty dealing with loss of lower leg.  She has been told she has to quite smoking for them to be able to do anything with her kidnies and she has not been smoking or using patch while in hospital and she would like to keep not using a nicotine patch.  She has been dealing with phantom limb pain, which is distressing to her, but we were able to address this issue and work on coping skills around that.  Disposition/Plan:  Will see patient again if she thinks it would be helpful for her.          Electronically Signed   _______________________ Ilean Skill, Psy.D.

## 2016-05-07 NOTE — Progress Notes (Signed)
[place] PHYSICAL MEDICINE & REHABILITATION     PROGRESS NOTE  Subjective/Complaints: slept better last noc, still some phantom pain  ROS: pt denies nausea, vomiting, diarrhea, cough, shortness of breath or chest pain    Objective: Vital Signs: Blood pressure (!) 150/56, pulse 67, temperature 98.1 F (36.7 C), temperature source Oral, resp. rate 16, height 5' 4" (1.626 m), weight 53.5 kg (118 lb), SpO2 97 %. No results found.  Recent Labs  05/06/16 1246 05/07/16 0607  WBC 11.2* 11.2*  HGB 14.5 12.0  HCT 43.8 35.8*  PLT 243 270    Recent Labs  05/05/16 1549  NA 133*  K 3.6  CL 97*  GLUCOSE 97  BUN 37*  CREATININE 6.13*  CALCIUM 8.7*   CBG (last 3)   Recent Labs  05/06/16 1641 05/06/16 2159 05/07/16 0625  GLUCAP 137* 129* 122*    Wt Readings from Last 3 Encounters:  05/07/16 53.5 kg (118 lb)  04/30/16 54.7 kg (120 lb 9.5 oz)  04/17/16 58.7 kg (129 lb 6.4 oz)    Physical Exam:  BP (!) 150/56 (BP Location: Right Arm)   Pulse 67   Temp 98.1 F (36.7 C) (Oral)   Resp 16   Ht 5' 4" (1.626 m)   Wt 53.5 kg (118 lb)   SpO2 97%   BMI 20.25 kg/m  Constitutional: She appears well-developed and well-nourished. NAD. HENT: Normocephalic and atraumatic. Edentulous   Eyes: EOMI. No discharge.  Cardiovascular: RRR Respiratory: normal effort GI: Soft. Bowel sounds are normal.  Musculoskeletal: She exhibits edema and tenderness.  Neurological: She is alert and oriented.  Motor: B/l UE 4/5 RLE: HF 2+/5, KE 3/5, ADF/PF 4/5 (improving) LLE: HF 4/5  Skin: Skin is warm and dry. Dusky extremities unchanged Right toes dusky,with boggy heel.   Stump with staples c/d/I Decubitus ulcers present Psychiatric: flat.    Assessment/Plan: 1. Functional deficits secondary to left BKA which require 3+ hours per day of interdisciplinary therapy in a comprehensive inpatient rehab setting. Physiatrist is providing close team supervision and 24 hour management of active  medical problems listed below. Physiatrist and rehab team continue to assess barriers to discharge/monitor patient progress toward functional and medical goals.  Function:  Bathing Bathing position   Position: Wheelchair/chair at sink  Bathing parts Body parts bathed by patient: Right arm, Left arm, Chest, Right upper leg, Left upper leg, Right lower leg, Abdomen, Front perineal area Body parts bathed by helper: Buttocks  Bathing assist Assist Level: Touching or steadying assistance(Pt > 75%)      Upper Body Dressing/Undressing Upper body dressing   What is the patient wearing?: Pull over shirt/dress     Pull over shirt/dress - Perfomed by patient: Thread/unthread right sleeve, Thread/unthread left sleeve, Put head through opening, Pull shirt over trunk          Upper body assist Assist Level: Set up   Set up : To obtain clothing/put away  Lower Body Dressing/Undressing Lower body dressing   What is the patient wearing?: Underwear, Pants Underwear - Performed by patient: Thread/unthread right underwear leg, Thread/unthread left underwear leg, Pull underwear up/down Underwear - Performed by helper: Pull underwear up/down Pants- Performed by patient: Thread/unthread right pants leg, Thread/unthread left pants leg, Pull pants up/down Pants- Performed by helper: Thread/unthread right pants leg, Thread/unthread left pants leg, Pull pants up/down Non-skid slipper socks- Performed by patient: Don/doff right sock Non-skid slipper socks- Performed by helper: Don/doff right sock (left sock NA)  Lower body assist Assist for lower body dressing: Touching or steadying assistance (Pt > 75%)      Toileting Toileting Toileting activity did not occur:  (does not void, no bm today)        Toileting assist     Transfers Chair/bed transfer   Chair/bed transfer method: Squat pivot Chair/bed transfer assist level: Touching or steadying assistance (Pt >  75%) Chair/bed transfer assistive device: Armrests     Locomotion Ambulation     Max distance: 12 ft Assist level: Touching or steadying assistance (Pt > 75%)   Wheelchair   Type: Manual Max wheelchair distance: 150 ft Assist Level: Supervision or verbal cues  Cognition Comprehension Comprehension assist level: Understands basic 50 - 74% of the time/ requires cueing 25 - 49% of the time  Expression Expression assist level: Expresses basic 90% of the time/requires cueing < 10% of the time.  Social Interaction Social Interaction assist level: Interacts appropriately 75 - 89% of the time - Needs redirection for appropriate language or to initiate interaction.  Problem Solving Problem solving assist level: Solves basic 50 - 74% of the time/requires cueing 25 - 49% of the time  Memory Memory assist level: Recognizes or recalls 50 - 74% of the time/requires cueing 25 - 49% of the time    Medical Problem List and Plan: 1.  Gait abnormality, difficulty with transfers, limitation with self-care secondary to left BKA.  Cont CIR PT, OT-  Team conference today please see physician documentation under team conference tab, met with team face-to-face to discuss problems,progress, and goals. Formulized individual treatment plan based on medical history, underlying problem and comorbidities. 2.  DVT Prophylaxis/Anticoagulation: Pharmaceutical:  Coumadin --adjustment per pharmacy 3. Chronic back pain/Pain Management: reduce  Gabapentin, increase  Lyrica. Used dilaudid at home. Dilaudid  Monitor with increased activity  Lidocaine ointment for pressure sores  -suspect chronic pain/behavioral component. Neuropsych recommended  -added robaxin for muscle spasms.  4. Mood: Improving with decrease in pain. LCSW to follow for evaluation and support.  5. Neuropsych: This patient is capable of making decisions on her own behalf. 6. Skin/Wound Care: Routine wound care. Stump healing  -pressure sore RIght  heel- PRAFO 7. Fluids/Electrolytes/Nutrition: Strict I/O. Serial labs with HD to monitor lytes 8. ESRD: Monitor weight daily with strict I/O. Schedule HD late afternoons to help with tolerance of activity during the day.  9. CAD s/p CABG with AVR: Cont meds 10. 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 T2DM: Monitor BS ac/hs. Po intake poor--used 2 units levemir daily prn BS > 150. Will use SSI for elevated BS until intake is consistent.   Overall controlled 3/28 12. HTN: Monitor bid.   Labile at times, monitor in accordance with HD per nephro 13. H/o Migraines: Managed with prn medications.  14. Anemia of chronic disease: Aranesp weekly with transfusion prn.   Hb 13.3 on 3/26  Improved- Aranesp per nephro 15. COPD: Respiratory status stable.  16. Leucocytosis:.   WBCs 12.5 3/26  afebrile  LOS (Days) 7 A FACE TO FACE EVALUATION WAS PERFORMED  KIRSTEINS,ANDREW E 05/07/2016 8:01 AM     

## 2016-05-07 NOTE — Progress Notes (Signed)
ANTICOAGULATION CONSULT NOTE - Follow Up Consult  Pharmacy Consult for coumadin Indication: AVR  Allergies  Allergen Reactions  . Acetaminophen Other (See Comments)    Liver transplant recipient   . Morphine Itching and Other (See Comments)    hallucinate  . Codeine Itching  . Mirtazapine Other (See Comments)    hallucination    Patient Measurements: Height: 5\' 4"  (162.6 cm) Weight: 118 lb (53.5 kg) IBW/kg (Calculated) : 54.7 Heparin Dosing Weight:   Vital Signs: Temp: 98.1 F (36.7 C) (03/28 0453) Temp Source: Oral (03/28 0453) BP: 150/56 (03/28 0453) Pulse Rate: 67 (03/28 0453)  Labs:  Recent Labs  05/05/16 0702 05/05/16 1549 05/06/16 1246 05/07/16 0607  HGB 13.3  --  14.5 12.0  HCT 39.9  --  43.8 35.8*  PLT 299  --  243 270  LABPROT 35.2*  --  32.6* 43.3*  INR 3.41  --  3.10 4.41*  CREATININE  --  6.13*  --   --     Estimated Creatinine Clearance: 8.1 mL/min (A) (by C-G formula based on SCr of 6.13 mg/dL (H)).   Medications:  Scheduled:  . aspirin EC  81 mg Oral Daily  . calcitRIOL  0.5 mcg Oral Q M,W,F-HD  . darbepoetin (ARANESP) injection - DIALYSIS  25 mcg Intravenous Q Mon-HD  . feeding supplement  1 Container Oral BID BM  . feeding supplement (PRO-STAT SUGAR FREE 64)  30 mL Oral BID  . gabapentin  300 mg Oral QHS  . HYDROmorphone  2 mg Oral Once  . insulin aspart  0-5 Units Subcutaneous QHS  . insulin aspart  0-9 Units Subcutaneous TID WC  . levothyroxine  175 mcg Oral QAC breakfast  . methocarbamol  250 mg Oral TID  . metoprolol tartrate  25 mg Oral BID  . multivitamin  1 tablet Oral QHS  . pantoprazole  40 mg Oral Daily  . polyethylene glycol  17 g Oral Daily  . [START ON 05/08/2016] pregabalin  50 mg Oral Daily  . tacrolimus  2 mg Oral BID  . Warfarin - Pharmacist Dosing Inpatient   Does not apply q1800   Infusions:    Assessment: 61 yo female with AVR, on coumadin, INR supratherapeutic this morning 3.1 > 4.41, likely d/t higher dose  several days ago.  Coumadin PTA dose: 2mg  daily (per anticoag clinic note on 3/8)  Goal of Therapy:  INR 2.5-3.5 Monitor platelets by anticoagulation protocol: Yes   Plan:  - Hold coumadin tonight - INR in am  Maryanna Shape, PharmD, BCPS  Clinical Pharmacist  Pager: [phone]   05/07/2016,9:15 AM

## 2016-05-07 NOTE — Progress Notes (Signed)
Nutrition Follow-up  DOCUMENTATION CODES:   Non-severe (moderate) malnutrition in context of chronic illness  INTERVENTION:  Provide Boost Breeze po TID, each supplement provides 250 kcal and 9 grams of protein.  Provide 30 ml Prostat po BID, each supplement provides 100 kcal and 15 grams of protein.   Encourage adequate PO intake.   NUTRITION DIAGNOSIS:   Malnutrition related to chronic illness as evidenced by moderate depletion of body fat, moderate depletions of muscle mass; ongoing  GOAL:   Patient will meet greater than or equal to 90% of their needs; progressing  MONITOR:   PO intake, Supplement acceptance, Labs, Weight trends, Skin, I & O's  REASON FOR ASSESSMENT:   Malnutrition Screening Tool    ASSESSMENT:   61 y.o. female with history of ESRD--HD MWF, Hep C s/p liver transplant, CAD s/p CABG with AVR, chronic pain, PAD with chronic heel ulcer and ischemic toes. She has had progressive pain  and was in process of being set for BKA. She was admitted on 04/21/16 with excruciating pain and inability to stand. She underwent L-BKA by Dr. Scot Dock on 04/24/16.   Meal completion has been varied from 10-50%. Pt currently has Boost Breeze and Prostat ordered and has been consuming them. RD to increase Boost Breeze to TID to aid in adequate nutrition. Pt encouraged to eat her food at meals.   Labs and medications reviewed.   Diet Order:  Diet regular Room service appropriate? Yes; Fluid consistency: Thin; Fluid restriction: 1200 mL Fluid  Skin:  Wound (see comment) (Stg 2 coccyx, stage I L &R  hip, incision on L leg)  Last BM:  3/27  Height:   Ht Readings from Last 1 Encounters:  04/30/16 5\' 4"  (1.626 m)    Weight:   Wt Readings from Last 1 Encounters:  05/07/16 118 lb (53.5 kg)    Ideal Body Weight:  51.3 kg (adjusted for L BKA)  BMI:  Body mass index is 20.25 kg/m.  Estimated Nutritional Needs:   Kcal:  1700-1900  Protein:  75-90 grams  Fluid:  1.2  L/day  EDUCATION NEEDS:   No education needs identified at this time  Corrin Parker, MS, RD, LDN Pager # [phone] After hours/ weekend pager # [phone]

## 2016-05-08 ENCOUNTER — Inpatient Hospital Stay (HOSPITAL_COMMUNITY): Payer: Medicare Other | Admitting: Occupational Therapy

## 2016-05-08 ENCOUNTER — Inpatient Hospital Stay (HOSPITAL_COMMUNITY): Payer: Medicare Other | Admitting: Physical Therapy

## 2016-05-08 LAB — GLUCOSE, CAPILLARY
GLUCOSE-CAPILLARY: 142 mg/dL — AB (ref 65–99)
Glucose-Capillary: 78 mg/dL (ref 65–99)
Glucose-Capillary: 154 mg/dL — ABNORMAL HIGH (ref 65–99)
Glucose-Capillary: 96 mg/dL (ref 65–99)
Glucose-Capillary: 106 mg/dL — ABNORMAL HIGH (ref 65–99)
Glucose-Capillary: 142 mg/dL — ABNORMAL HIGH (ref 65–99)

## 2016-05-08 LAB — CBC
HEMATOCRIT: 33.9 % — AB (ref 36.0–46.0)
Hemoglobin: 11.3 g/dL — ABNORMAL LOW (ref 12.0–15.0)
MCH: 29.5 pg (ref 26.0–34.0)
MCHC: 33.3 g/dL (ref 30.0–36.0)
MCV: 88.5 fL (ref 78.0–100.0)
Platelets: 248 10*3/uL (ref 150–400)
RBC: 3.83 MIL/uL — ABNORMAL LOW (ref 3.87–5.11)
RDW: 17.4 % — AB (ref 11.5–15.5)
WBC: 11.8 10*3/uL — ABNORMAL HIGH (ref 4.0–10.5)

## 2016-05-08 LAB — PROTIME-INR
INR: 3.92
Prothrombin Time: 39.4 seconds — ABNORMAL HIGH (ref 11.4–15.2)

## 2016-05-08 MED ORDER — WARFARIN 0.5 MG HALF TABLET
0.5000 mg | ORAL_TABLET | Freq: Once | ORAL | Status: AC
  Administered 2016-05-08: 0.5 mg via ORAL
  Filled 2016-05-08: qty 1

## 2016-05-08 MED ORDER — COLLAGENASE 250 UNIT/GM EX OINT
TOPICAL_OINTMENT | Freq: Every day | CUTANEOUS | Status: DC
  Administered 2016-05-08 – 2016-05-17 (×10): via TOPICAL
  Administered 2016-05-18: 1 via TOPICAL
  Administered 2016-05-19: 08:00:00 via TOPICAL
  Filled 2016-05-08 (×2): qty 30

## 2016-05-08 MED ORDER — ZINC SULFATE 220 (50 ZN) MG PO CAPS
220.0000 mg | ORAL_CAPSULE | Freq: Every day | ORAL | Status: DC
  Administered 2016-05-08 – 2016-05-20 (×11): 220 mg via ORAL
  Filled 2016-05-08 (×13): qty 1

## 2016-05-08 MED ORDER — VITAMIN C 500 MG PO TABS
250.0000 mg | ORAL_TABLET | Freq: Two times a day (BID) | ORAL | Status: DC
  Administered 2016-05-08: 250 mg via ORAL
  Filled 2016-05-08: qty 1

## 2016-05-08 NOTE — Progress Notes (Signed)
Physical Therapy Session Note  Patient Details  Name: [name] MRN: [id number] Date of Birth: Jul 10, 1955  Today's Date: 05/08/2016 PT Individual Time: 0805-0900 PT Individual Time Calculation (min): 55 min   Short Term Goals: Week 1:  PT Short Term Goal 1 (Week 1): Patient will perform transfers with consistent min A.  PT Short Term Goal 2 (Week 1): Patient will ambulate 25 ft using RW with min A. PT Short Term Goal 3 (Week 1): Patient will perform sit <> stand using RW with min A.  Skilled Therapeutic Interventions/Progress Updates:   Patient in bed crying upon arrival with L residual limb dressing doffed and R PRAFO off, c/o 10/10 residual limb pain. RN made aware and administered pain medication. Patient performed bed mobility with supervision using hospital bed functions. Performed LB dressing by threading and donning underwear in supine with bridging and donning pants sitting EOB with sit <> stand using RW with mod A and verbal cues to remove only 1 hand from RW at a time to pull pants over hips. UB dressing sitting EOB with setup assist. LLE therex for strengthening and ROM: sidelying knee flexion/extension to tolerance with patient reporting decrease in pain to 8/10, sidelying hip flexion/extension to neutral to tolerance, sidelying hip abduction to tolerance, supine SLR x 20, supine single limb bridging 2 x 5. Supine alternating cross body reaching for core strengthening. Patient required increased time to return to sitting EOB with rail and min A. Performed stand pivot transfer to wheelchair using RW with max verbal cues for sequencing and technique and min A overall. Patient instructed in deep breathing and relaxation techniques throughout session for pain management. Patient left sitting in wheelchair with quick release belt donned, KI donned, and needs in reach.   Therapy Documentation Precautions:  Precautions Precautions: Fall Required Braces or Orthoses: Knee Immobilizer -  Left Knee Immobilizer - Left: Other (comment) (on except in PT) Restrictions Weight Bearing Restrictions: Yes LLE Weight Bearing: Non weight bearing Pain: Pain Assessment Pain Assessment: 0-10 Pain Score: 8  Pain Type: Acute pain Pain Location: Leg Pain Orientation: Left Pain Descriptors / Indicators: Aching;Throbbing Pain Onset: On-going Pain Intervention(s): RN made aware;Repositioned  See Function Navigator for Current Functional Status.   Therapy/Group: Individual Therapy  Nolita Kutter, Murray Hodgkins 05/08/2016, 8:59 AM

## 2016-05-08 NOTE — Progress Notes (Signed)
Dialysis Orders:[name] MWF 3h 59min 2/2 bath 58kg Hep none R IJ cath Assessment/Plan: 1. 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 Debility - on CIR since 3/21, improved 3. ESRD MWF HD - next HD Fri 4. BP/Volume -BP acceptable 5. Hx AVR on coumadin -  6. Hx liver Tx - on prograf only   Subjective: Interval History: Stronger daily  Objective: Vital signs in last 24 hours: Temp:  [97.5 F (36.4 C)-99 F (37.2 C)] 99 F (37.2 C) (03/29 0500) Pulse Rate:  [55-72] 72 (03/29 0500) Resp:  [16-18] 16 (03/29 0500) BP: (77-160)/(45-90) 140/53 (03/29 0500) SpO2:  [95 %-97 %] 95 % (03/29 0500) Weight:  [50 kg (110 lb 3.7 oz)-52.4 kg (115 lb 8.3 oz)] 51.3 kg (113 lb 3.2 oz) (03/29 0500) Weight change: -1.124 kg (-2 lb 7.7 oz)  Intake/Output from previous day: 03/28 0701 - 03/29 0700 In: 240 [P.O.:240] Out: 973  Intake/Output this shift: Total I/O In: 120 [P.O.:120] Out: -   Resp: clear to auscultation bilaterally Chest wall: no tenderness Cardio: regular rate and rhythm, S1, S2 normal, no murmur, click, rub or gallop Extremities: L BKA, no edema  Lab Results:  Recent Labs  05/07/16 1435 05/08/16 0731  WBC 12.1* 11.8*  HGB 11.7* 11.3*  HCT 35.3* 33.9*  PLT 298 248   BMET:  Recent Labs  05/05/16 1549 05/07/16 1435  NA 133* 137  K 3.6 3.2*  CL 97* 99*  CO2 27 27  GLUCOSE 97 111*  BUN 37* 35*  CREATININE 6.13* 5.67*  CALCIUM 8.7* 8.4*   No results for input(s): PTH in the last 72 hours. Iron Studies: No results for input(s): IRON, TIBC, TRANSFERRIN, FERRITIN in the last 72 hours. Studies/Results: No results found.  Scheduled: . aspirin EC  81 mg Oral Daily  . calcitRIOL  0.5 mcg Oral Q M,W,F-HD  . darbepoetin (ARANESP) injection - DIALYSIS  25 mcg Intravenous Q Mon-HD  . feeding supplement  1 Container Oral TID BM  . feeding supplement (PRO-STAT SUGAR FREE 64)  30 mL Oral BID  . gabapentin  300 mg Oral QHS  . HYDROmorphone  2 mg Oral Once  . insulin aspart  0-5 Units  Subcutaneous QHS  . insulin aspart  0-9 Units Subcutaneous TID WC  . levothyroxine  175 mcg Oral QAC breakfast  . methocarbamol  250 mg Oral TID  . metoprolol tartrate  25 mg Oral BID  . multivitamin  1 tablet Oral QHS  . pantoprazole  40 mg Oral Daily  . polyethylene glycol  17 g Oral Daily  . pregabalin  50 mg Oral Daily  . tacrolimus  2 mg Oral BID  . warfarin  0.5 mg Oral ONCE-1800  . Warfarin - Pharmacist Dosing Inpatient   Does not apply q1800     LOS: 8 days   Chon Buhl C 05/08/2016,12:05 PM

## 2016-05-08 NOTE — Progress Notes (Signed)
Social Work Patient ID: [name], female   DOB: 04/12/1955, 61 y.o.   MRN: [id number]  Met with pt to discuss discharge plans. Ramp to be built on Monday and only will take one day, so will be ready To go home Tuesday. Has HD M,W,F and will need to have this in place to be able to get back and forth to dialysis. Will confirm with daughter when here later today.

## 2016-05-08 NOTE — Progress Notes (Signed)
Occupational Therapy Weekly Progress Note  Patient Details  Name: [name] MRN: [id number] Date of Birth: [birth year]  Beginning of progress report period: May 01, 2016 End of progress report period: May 08, 2016  Today's Date: 05/08/2016 OT Individual Time: 3244-0102 OT Individual Time Calculation (min): 74 min    Patient has met 0 of 4 short term goals.  Pt is making progress towards her goals, but is not consistently at a supervision level for sit<>stand or standing when performing ADL. Pt is able to complete functional squat-pivot transfers with overall min A and toilet using leaning method. She is able to thread pants/underwear and don socks, but still requires intermittent min guard A for sit<>stand and standing balance during bathing/dressing. She is able to tolerate 2-3 mins in standing but experiences lateral/posterior LOB at times when reaching to pull pants over hips or wash bottom, requiring min A to correct. Overall, pt is making progress towards functional goals. Continue current plan.  Patient continues to demonstrate the following deficits: muscle weakness and decreased standing balance and decreased balance strategies and therefore will continue to benefit from skilled OT intervention to enhance overall performance with BADL.  Patient progressing toward long term goals..  Continue plan of care.  OT Short Term Goals Week 1:  OT Short Term Goal 1 (Week 1): Pt will complete all bathing sit to stand with supervision.  OT Short Term Goal 1 - Progress (Week 1): Progressing toward goal OT Short Term Goal 2 (Week 1): Pt will completed toilet transfers and toileting with supervision sit to stand.  OT Short Term Goal 2 - Progress (Week 1): Met OT Short Term Goal 3 (Week 1): Pt will complete LB dressing sit to stand with supervision.  OT Short Term Goal 3 - Progress (Week 1): Met OT Short Term Goal 4 (Week 1): Pt will perform standing at the sink for 2 mins with no more than  supervision during completion of grooming tasks.   OT Short Term Goal 4 - Progress (Week 1): Progressing toward goal Week 2:  OT Short Term Goal 1 (Week 2): LTG=STG 2/2 estimated LOS   Skilled Therapeutic Interventions/Progress Updates:    OT treatment session focused on improved sit><stand and dynamic standing balance within modified bathing/dressing tasks. Pt gathered clothing at wc level with education provided for wc positioning to access drawers. UB bathing w/ set-up A. Sit<>stand with Min A and focus on anterior weight shift. Min guard A overall with intermittent supervision to maintain standing balance with alternating unilateral UE support to reach and wash buttocks or pull pants over hips. Instructed pt on hip balance strategies to facilitate forward posture with improved balance. Pt tolerated 3 minutes of standing at longest standing bout. Pt requires assist to tighten knee immobilizer but is able to life L residual limb and put KI in posoition. Pt left seated in wc at end of session with needs met, safety belt on, and call bell in reach.   Therapy Documentation Precautions:  Precautions Precautions: Fall Required Braces or Orthoses: Knee Immobilizer - Left Knee Immobilizer - Left: Other (comment) (on except in PT) Restrictions Weight Bearing Restrictions: Yes LLE Weight Bearing: Non weight bearing Pain: Pain Assessment Pain Assessment: 0-10 Pain Score: 8  Pain Type: Acute pain Pain Location: Leg Pain Orientation: Left Pain Descriptors / Indicators: Aching;Throbbing Pain Onset: On-going Pain Intervention(s): RN made aware;Repositioned  See Function Navigator for Current Functional Status.   Therapy/Group: Individual Therapy  [name] 05/08/2016, 10:05 AM

## 2016-05-08 NOTE — Progress Notes (Signed)
ANTICOAGULATION CONSULT NOTE - Follow Up Consult  Pharmacy Consult for coumadin Indication: AVR  Allergies  Allergen Reactions  . Acetaminophen Other (See Comments)    Liver transplant recipient   . Morphine Itching and Other (See Comments)    hallucinate  . Codeine Itching  . Mirtazapine Other (See Comments)    hallucination    Patient Measurements: Height: 5\' 4"  (162.6 cm) Weight: 113 lb 3.2 oz (51.3 kg) IBW/kg (Calculated) : 54.7 Heparin Dosing Weight:   Vital Signs: Temp: 99 F (37.2 C) (03/29 0500) Temp Source: Oral (03/29 0500) BP: 140/53 (03/29 0500) Pulse Rate: 72 (03/29 0500)  Labs:  Recent Labs  05/05/16 1549  05/06/16 1246 05/07/16 0607 05/07/16 1435 05/08/16 0731  HGB  --   < > 14.5 12.0 11.7* 11.3*  HCT  --   < > 43.8 35.8* 35.3* 33.9*  PLT  --   < > 243 270 298 248  LABPROT  --   --  32.6* 43.3*  --  39.4*  INR  --   --  3.10 4.41*  --  3.92  CREATININE 6.13*  --   --   --  5.67*  --   < > = values in this interval not displayed.  Estimated Creatinine Clearance: 8.4 mL/min (A) (by C-G formula based on SCr of 5.67 mg/dL (H)).   Medications:  Scheduled:  . aspirin EC  81 mg Oral Daily  . calcitRIOL  0.5 mcg Oral Q M,W,F-HD  . darbepoetin (ARANESP) injection - DIALYSIS  25 mcg Intravenous Q Mon-HD  . feeding supplement  1 Container Oral TID BM  . feeding supplement (PRO-STAT SUGAR FREE 64)  30 mL Oral BID  . gabapentin  300 mg Oral QHS  . HYDROmorphone  2 mg Oral Once  . insulin aspart  0-5 Units Subcutaneous QHS  . insulin aspart  0-9 Units Subcutaneous TID WC  . levothyroxine  175 mcg Oral QAC breakfast  . methocarbamol  250 mg Oral TID  . metoprolol tartrate  25 mg Oral BID  . multivitamin  1 tablet Oral QHS  . pantoprazole  40 mg Oral Daily  . polyethylene glycol  17 g Oral Daily  . pregabalin  50 mg Oral Daily  . tacrolimus  2 mg Oral BID  . Warfarin - Pharmacist Dosing Inpatient   Does not apply q1800   Infusions:     Assessment: 61 yo female with AVR, on coumadin, INR supratherapeutic this morning 4.4 > 3.92, likely d/t higher dose several days ago. CBC stable, no bleeding noted per chart.   Coumadin PTA dose: 2mg  daily (per anticoag clinic note on 3/8)  Goal of Therapy:  INR 2.5-3.5 Monitor platelets by anticoagulation protocol: Yes   Plan:  - Coumadin 0.5 mg po x 1 today  - f/u daily PT/INR  Maryanna Shape, PharmD, BCPS  Clinical Pharmacist  Pager: [phone]   05/08/2016,10:24 AM

## 2016-05-08 NOTE — Progress Notes (Signed)
[facility] PHYSICAL MEDICINE & REHABILITATION     PROGRESS NOTE  Subjective/Complaints: No issues overnite  ROS: pt denies nausea, vomiting, diarrhea, cough, shortness of breath or chest pain    Objective: Vital Signs: Blood pressure (!) 140/53, pulse 72, temperature 99 F (37.2 C), temperature source Oral, resp. rate 16, height 5\' 4"  (1.626 m), weight 51.3 kg (113 lb 3.2 oz), SpO2 95 %. No results found.  Recent Labs  05/07/16 0607 05/07/16 1435  WBC 11.2* 12.1*  HGB 12.0 11.7*  HCT 35.8* 35.3*  PLT 270 298    Recent Labs  05/05/16 1549 05/07/16 1435  NA 133* 137  K 3.6 3.2*  CL 97* 99*  GLUCOSE 97 111*  BUN 37* 35*  CREATININE 6.13* 5.67*  CALCIUM 8.7* 8.4*   CBG (last 3)   Recent Labs  05/07/16 1937 05/07/16 2121 05/08/16 0644  GLUCAP 78 154* 96    Wt Readings from Last 3 Encounters:  05/08/16 51.3 kg (113 lb 3.2 oz)  04/30/16 54.7 kg (120 lb 9.5 oz)  04/17/16 58.7 kg (129 lb 6.4 oz)    Physical Exam:  BP (!) 140/53 (BP Location: Right Arm)   Pulse 72   Temp 99 F (37.2 C) (Oral)   Resp 16   Ht 5\' 4"  (1.626 m)   Wt 51.3 kg (113 lb 3.2 oz)   SpO2 95%   BMI 19.43 kg/m  Constitutional: She appears well-developed and well-nourished. NAD. HENT: Normocephalic and atraumatic. Edentulous   Eyes: EOMI. No discharge.  Cardiovascular: RRR Respiratory: normal effort GI: Soft. Bowel sounds are normal.  Musculoskeletal: She exhibits edema and tenderness.  Neurological: She is alert and oriented.  Motor: B/l UE 4/5 RLE: HF 2+/5, KE 3/5, ADF/PF 4/5 (improving) LLE: HF 4/5  Skin: Skin is warm and dry. Dusky extremities unchanged Right toes dusky,with boggy heel.   Stump with staples c/d/I Decubitus ulcers present Psychiatric: flat.    Assessment/Plan: 1. Functional deficits secondary to left BKA which require 3+ hours per day of interdisciplinary therapy in a comprehensive inpatient rehab setting. Physiatrist is providing close team supervision  and 24 hour management of active medical problems listed below. Physiatrist and rehab team continue to assess barriers to discharge/monitor patient progress toward functional and medical goals.  Function:  Bathing Bathing position   Position: Wheelchair/chair at sink  Bathing parts Body parts bathed by patient: Right arm, Left arm, Chest, Right upper leg, Left upper leg, Right lower leg, Abdomen, Front perineal area, Buttocks Body parts bathed by helper: Back  Bathing assist Assist Level: Touching or steadying assistance(Pt > 75%)      Upper Body Dressing/Undressing Upper body dressing   What is the patient wearing?: Pull over shirt/dress     Pull over shirt/dress - Perfomed by patient: Thread/unthread right sleeve, Thread/unthread left sleeve, Put head through opening, Pull shirt over trunk          Upper body assist Assist Level: Set up   Set up : To obtain clothing/put away  Lower Body Dressing/Undressing Lower body dressing   What is the patient wearing?: Pants Underwear - Performed by patient: Thread/unthread right underwear leg, Thread/unthread left underwear leg, Pull underwear up/down Underwear - Performed by helper: Pull underwear up/down Pants- Performed by patient: Thread/unthread right pants leg, Thread/unthread left pants leg, Pull pants up/down Pants- Performed by helper: Thread/unthread right pants leg, Thread/unthread left pants leg, Pull pants up/down Non-skid slipper socks- Performed by patient: Don/doff right sock Non-skid slipper socks- Performed by helper:  Don/doff right sock (left sock NA)                  Lower body assist Assist for lower body dressing: Touching or steadying assistance (Pt > 75%)      Toileting Toileting Toileting activity did not occur:  (does not void, no bm today)        Toileting assist     Transfers Chair/bed transfer   Chair/bed transfer method: Ambulatory Chair/bed transfer assist level: Touching or steadying  assistance (Pt > 75%) Chair/bed transfer assistive device: Armrests, Medical sales representative     Max distance: 36 ft Assist level: Touching or steadying assistance (Pt > 75%)   Wheelchair   Type: Manual Max wheelchair distance: 130 ft Assist Level: Supervision or verbal cues  Cognition Comprehension Comprehension assist level: Understands basic 90% of the time/cues < 10% of the time  Expression Expression assist level: Expresses basic needs/ideas: With no assist  Social Interaction Social Interaction assist level: Interacts appropriately with others with medication or extra time (anti-anxiety, antidepressant).  Problem Solving Problem solving assist level: Solves basic 75 - 89% of the time/requires cueing 10 - 24% of the time  Memory Memory assist level: Recognizes or recalls 75 - 89% of the time/requires cueing 10 - 24% of the time    Medical Problem List and Plan: 1.  Gait abnormality, difficulty with transfers, limitation with self-care secondary to left BKA.  Cont CIR PT, OT-   2.  DVT Prophylaxis/Anticoagulation: Pharmaceutical:  Coumadin --adjustment per pharmacy 3. Chronic back pain/Pain Management: reduce  Gabapentin, increase  Lyrica. Used dilaudid at home. Dilaudid  Monitor with increased activity  Lidocaine ointment for pressure sores  -suspect chronic pain/behavioral component. Neuropsych recommended  -added robaxin for muscle spasms.  4. Mood: Improving with decrease in pain. LCSW to follow for evaluation and support.  5. Neuropsych: This patient is capable of making decisions on her own behalf. 6. Skin/Wound Care: Routine wound care. Stump healing-Appreciate VVS note  -pressure sore RIght heel- PRAFO 7. Fluids/Electrolytes/Nutrition: Strict I/O. Serial labs with HD to monitor lytes 8. ESRD: Monitor weight daily with strict I/O. Schedule HD late afternoons to help with tolerance of activity during the day.  9. CAD s/p CABG with AVR: Cont meds 10. Liver  transplant due to Hep C: On prograf bid 11. T2DM: Monitor BS ac/hs. Po intake poor--used 2 units levemir daily prn BS > 150. Will use SSI for elevated BS until intake is consistent.  controlled   CBG (last 3)   Recent Labs  05/07/16 1937 05/07/16 2121 05/08/16 0644  GLUCAP 78 154* 96    12. HTN: Monitor bid.   Labile at times, monitor in accordance with HD per nephro 13. H/o Migraines: Managed with prn medications.  14. Anemia of chronic disease: Aranesp weekly with transfusion prn.   Hb 13.3 on 3/26  Improved- Aranesp per nephro 15. COPD: Respiratory status stable.  16. Leucocytosis:.   WBCs 12.5 3/26  afebrile  LOS (Days) 8 A FACE TO FACE EVALUATION WAS PERFORMED  Charlett Blake 05/08/2016 7:31 AM

## 2016-05-08 NOTE — Progress Notes (Signed)
Occupational Therapy Session Note  Patient Details  Name: [name] MRN: [id number] Date of Birth: 1955-12-19  Today's Date: 05/08/2016 OT Individual Time: 1300-1358 OT Individual Time Calculation (min): 58 min    Short Term Goals: Week 2:  OT Short Term Goal 1 (Week 2): LTG=STG 2/2 estimated LOS  Skilled Therapeutic Interventions/Progress Updates:    Upon entering the room, pt seated in wheelchair with 10/10 c/o pain on buttocks. Pt set up from pressure relief by leaning onto elbow towards bed and pt reports immediate relief. RN notified for pain medication and skin check. Pt standing with min A and use of RW for RN to inspect skin this session. OT began education regarding pressure relief. Education to continue. Pt propelled wheelchair with bag of dirty clothes to laundry room. Pt performed sit <> stand with steady assistance for balance. Pt needing steady assist while placing clothing items into washing machine and setting appropriately before returning to wheelchair. Pt also managing leg rest this session with demonstration and verbal cues for proper technique. Pt returning to room to rest and positioned on R side for pressure relief. Bed alarm activated and all needs within reach upon exiting the room.  Therapy Documentation Precautions:  Precautions Precautions: Fall Required Braces or Orthoses: Knee Immobilizer - Left Knee Immobilizer - Left: Other (comment) (on except in PT) Restrictions Weight Bearing Restrictions: Yes LLE Weight Bearing: Non weight bearing General:   Vital Signs: Therapy Vitals Temp: 97.7 F (36.5 C) Temp Source: Oral Pulse Rate: 63 Resp: 18 BP: (!) 135/36 Patient Position (if appropriate): Lying Oxygen Therapy SpO2: 97 % O2 Device: Not Delivered Pain:   ADL:   Exercises:   Other Treatments:    See Function Navigator for Current Functional Status.   Therapy/Group: Individual Therapy  [name] 05/08/2016, 3:55 PM

## 2016-05-09 ENCOUNTER — Inpatient Hospital Stay (HOSPITAL_COMMUNITY): Payer: Medicare Other | Admitting: Physical Therapy

## 2016-05-09 ENCOUNTER — Inpatient Hospital Stay (HOSPITAL_COMMUNITY): Payer: Medicare Other

## 2016-05-09 LAB — RENAL FUNCTION PANEL
ALBUMIN: 1.7 g/dL — AB (ref 3.5–5.0)
Anion gap: 13 (ref 5–15)
BUN: 30 mg/dL — AB (ref 6–20)
CO2: 25 mmol/L (ref 22–32)
CREATININE: 5.69 mg/dL — AB (ref 0.44–1.00)
Calcium: 8.5 mg/dL — ABNORMAL LOW (ref 8.9–10.3)
Chloride: 96 mmol/L — ABNORMAL LOW (ref 101–111)
GFR calc Af Amer: 8 mL/min — ABNORMAL LOW (ref >60)
GFR, EST NON AFRICAN AMERICAN: 7 mL/min — AB (ref >60)
Glucose, Bld: 110 mg/dL — ABNORMAL HIGH (ref 65–99)
PHOSPHORUS: 4.9 mg/dL — AB (ref 2.5–4.6)
Potassium: 4.9 mmol/L (ref 3.5–5.1)
Sodium: 134 mmol/L — ABNORMAL LOW (ref 135–145)

## 2016-05-09 LAB — CBC
HCT: 40.6 % (ref 36.0–46.0)
HCT: 36.2 % (ref 36.0–46.0)
Hemoglobin: 13.8 g/dL (ref 12.0–15.0)
Hemoglobin: 12.5 g/dL (ref 12.0–15.0)
MCH: 30.1 pg (ref 26.0–34.0)
MCH: 30 pg (ref 26.0–34.0)
MCHC: 34 g/dL (ref 30.0–36.0)
MCHC: 34.5 g/dL (ref 30.0–36.0)
MCV: 88.5 fL (ref 78.0–100.0)
MCV: 86.8 fL (ref 78.0–100.0)
PLATELETS: 310 10*3/uL (ref 150–400)
PLATELETS: 329 10*3/uL (ref 150–400)
RBC: 4.59 MIL/uL (ref 3.87–5.11)
RBC: 4.17 MIL/uL (ref 3.87–5.11)
RDW: 17.4 % — ABNORMAL HIGH (ref 11.5–15.5)
RDW: 17.4 % — AB (ref 11.5–15.5)
WBC: 18.9 10*3/uL — AB (ref 4.0–10.5)
WBC: 18.2 10*3/uL — AB (ref 4.0–10.5)

## 2016-05-09 LAB — PROTIME-INR
INR: 3.25
Prothrombin Time: 33.9 seconds — ABNORMAL HIGH (ref 11.4–15.2)

## 2016-05-09 LAB — GLUCOSE, CAPILLARY
GLUCOSE-CAPILLARY: 157 mg/dL — AB (ref 65–99)
GLUCOSE-CAPILLARY: 113 mg/dL — AB (ref 65–99)
GLUCOSE-CAPILLARY: 119 mg/dL — AB (ref 65–99)
Glucose-Capillary: 145 mg/dL — ABNORMAL HIGH (ref 65–99)
Glucose-Capillary: 208 mg/dL — ABNORMAL HIGH (ref 65–99)

## 2016-05-09 MED ORDER — PROMETHAZINE HCL 25 MG PO TABS
25.0000 mg | ORAL_TABLET | Freq: Four times a day (QID) | ORAL | Status: DC | PRN
  Administered 2016-05-10 – 2016-05-11 (×2): 25 mg via ORAL
  Filled 2016-05-09 (×5): qty 1

## 2016-05-09 MED ORDER — HEPARIN SODIUM (PORCINE) 1000 UNIT/ML DIALYSIS: 1000.0000 [IU] | INTRAMUSCULAR | Status: DC | PRN

## 2016-05-09 MED ORDER — HEPARIN SODIUM (PORCINE) 1000 UNIT/ML DIALYSIS: 20.0000 [IU]/kg | INTRAMUSCULAR | Status: DC | PRN

## 2016-05-09 MED ORDER — ALTEPLASE 2 MG IJ SOLR: 2.0000 mg | Freq: Once | INTRAMUSCULAR | Status: DC | PRN

## 2016-05-09 MED ORDER — PROMETHAZINE HCL 12.5 MG RE SUPP
12.5000 mg | Freq: Four times a day (QID) | RECTAL | Status: DC | PRN
  Administered 2016-05-09 – 2016-05-10 (×2): 12.5 mg via RECTAL
  Filled 2016-05-09 (×3): qty 1

## 2016-05-09 MED ORDER — SODIUM CHLORIDE 0.9 % IV SOLN: 100.0000 mL | INTRAVENOUS | Status: DC | PRN

## 2016-05-09 MED ORDER — LIDOCAINE HCL (PF) 1 % IJ SOLN: 5.0000 mL | INTRAMUSCULAR | Status: DC | PRN

## 2016-05-09 MED ORDER — PENTAFLUOROPROP-TETRAFLUOROETH EX AERO: 1.0000 "application " | INHALATION_SPRAY | CUTANEOUS | Status: DC | PRN

## 2016-05-09 MED ORDER — WARFARIN SODIUM 1 MG PO TABS
1.0000 mg | ORAL_TABLET | Freq: Once | ORAL | Status: AC
  Administered 2016-05-10: 1 mg via ORAL
  Filled 2016-05-09: qty 1

## 2016-05-09 MED ORDER — LIDOCAINE-PRILOCAINE 2.5-2.5 % EX CREA: 1.0000 "application " | TOPICAL_CREAM | CUTANEOUS | Status: DC | PRN

## 2016-05-09 NOTE — Progress Notes (Signed)
Patient back from hemodialysis at 2200. Patient incontinent of stool with complaints of nausea and vomiting. Patient given 12.5 mg phenergan suppository, and unable to take HS medications. Will continue to monitor patient.

## 2016-05-09 NOTE — Progress Notes (Signed)
Dialysis Orders:[name] MWF 3h 64min 2/2 bath 58kg Hep none R IJ cath Assessment/Plan: 1. 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 Debility, improved 3. ESRD MWF HD - next HD today 4. BP/Volume -BP acceptable 5. Hx AVR on coumadin -  6. Hx liver Tx - on prograf only  7. Leukocytosis 8    Nausea  Subjective: Interval History: Nausea this AM  Objective: Vital signs in last 24 hours: Temp:  [97.7 F (36.5 C)-98.1 F (36.7 C)] 98.1 F (36.7 C) (03/30 0545) Pulse Rate:  [63-80] 80 (03/30 0600) Resp:  [18] 18 (03/30 0545) BP: (135-186)/(36-74) 160/72 (03/30 0600) SpO2:  [95 %-97 %] 95 % (03/30 0545) Weight:  [51.8 kg (114 lb 1.6 oz)] 51.8 kg (114 lb 1.6 oz) (03/30 0545) Weight change: -0.645 kg (-1 lb 6.7 oz)  Intake/Output from previous day: 03/29 0701 - 03/30 0700 In: 342 [P.O.:342] Out: 2 [Stool:2] Intake/Output this shift: Total I/O In: 60 [P.O.:60] Out: -   Ill appearing in fetal postioin  Volume status ok  Lab Results:  Recent Labs  05/08/16 0731 05/09/16 0618  WBC 11.8* 18.9*  HGB 11.3* 13.8  HCT 33.9* 40.6  PLT 248 310   BMET:  Recent Labs  05/07/16 1435  NA 137  K 3.2*  CL 99*  CO2 27  GLUCOSE 111*  BUN 35*  CREATININE 5.67*  CALCIUM 8.4*   No results for input(s): PTH in the last 72 hours. Iron Studies: No results for input(s): IRON, TIBC, TRANSFERRIN, FERRITIN in the last 72 hours. Studies/Results: No results found.  Scheduled: . aspirin EC  81 mg Oral Daily  . calcitRIOL  0.5 mcg Oral Q M,W,F-HD  . collagenase   Topical Daily  . darbepoetin (ARANESP) injection - DIALYSIS  25 mcg Intravenous Q Mon-HD  . feeding supplement  1 Container Oral TID BM  . feeding supplement (PRO-STAT SUGAR FREE 64)  30 mL Oral BID  . gabapentin  300 mg Oral QHS  . HYDROmorphone  2 mg Oral Once  . insulin aspart  0-5 Units Subcutaneous QHS  . insulin aspart  0-9 Units Subcutaneous TID WC  . levothyroxine  175 mcg Oral QAC breakfast  . methocarbamol  250 mg Oral TID  .  metoprolol tartrate  25 mg Oral BID  . multivitamin  1 tablet Oral QHS  . pantoprazole  40 mg Oral Daily  . polyethylene glycol  17 g Oral Daily  . pregabalin  50 mg Oral Daily  . tacrolimus  2 mg Oral BID  . vitamin C  250 mg Oral BID  . warfarin  1 mg Oral ONCE-1800  . Warfarin - Pharmacist Dosing Inpatient   Does not apply q1800  . zinc sulfate  220 mg Oral Daily    LOS: 9 days   [name] C 05/09/2016,11:46 AM

## 2016-05-09 NOTE — Progress Notes (Signed)
Patient arrived to unit by bed.  Reviewed treatment plan and this RN agrees with plan.  Report received from bedside RN, Velta Addison.  Consent verified.  Patient A & O X 4.   Lung sounds clear to ausculation in all fields. Generalized non pitting edema. Cardiac:  Regular R&R.  Removed caps and cleansed RIJ catheter with chlorhedxidine.  Aspirated ports of heparin and flushed them with saline per protocol.  Connected and secured lines, initiated treatment at [place].  UF Goal of 2500 mL and net fluid removal 2 L.  Will continue to monitor.

## 2016-05-09 NOTE — Progress Notes (Signed)
Dialysis treatment completed.  2500 mL ultrafiltrated.  2000 mL net fluid removal.  Patient status unchanged. Lung sounds clear to ausculation in all fields. No edema. Cardiac: NSR.  Cleansed RIj catheter with chlorhexidine.  Disconnected lines and flushed ports with saline per protocol.  Ports locked with heparin and capped per protocol.    Report given to bedside, RN Anissa.

## 2016-05-09 NOTE — Progress Notes (Signed)
Occupational Therapy Session Note  Patient Details  Name: [name] MRN: [id number] Date of Birth: 1955-06-10  Today's Date: 05/09/2016 OT Missed Time:  60 min Missed Time Reason:    Attempted to see patient with daughter at bedside. Patient continues c/o being sick, stating her stomach and head hurt and continued nausea/vomiting. Patient missed 60 min skilled occupational therapy. Will f/u as able.       Short Term Goals: Week 2:  OT Short Term Goal 1 (Week 2): LTG=STG 2/2 estimated LOS   Therapy Documentation Precautions:  Precautions Precautions: Fall Required Braces or Orthoses: Knee Immobilizer - Left Knee Immobilizer - Left: Other (comment) (on except in PT) Restrictions Weight Bearing Restrictions: Yes LLE Weight Bearing: Non weight bearing  See Function Navigator for Current Functional Status.   Therapy/Group: Individual Therapy  Tonny Branch 05/09/2016, 3:08 PM

## 2016-05-09 NOTE — Progress Notes (Signed)
Social Work Elease Hashimoto, LCSW Social Worker Signed   Patient Care Conference Date of Service: 05/07/2016  1:19 PM      Hide copied text Hover for attribution information Inpatient RehabilitationTeam Conference and Plan of Care Update Date: 05/07/2016   Time: 10:10 AM      Patient Name: [name]      Medical Record Number: [id number]  Date of Birth: 1955/07/15 Sex: Female         Room/Bed: 4W16C/4W16C-01 Payor Info: Payor: MEDICARE / Plan: MEDICARE PART A AND B / Product Type: *No Product type* /     Admitting Diagnosis: L BKA  Admit Date/Time:  04/30/2016  3:50 PM Admission Comments: No comment available    Primary Diagnosis:  <principal problem not specified> Principal Problem: <principal problem not specified>       Patient Active Problem List    Diagnosis Date Noted  . Supratherapeutic INR    . Anemia of chronic disease    . Chronic midline low back pain without sciatica    . ESRD on dialysis ([place])    . Type 2 diabetes mellitus with complication, with long-term current use of insulin ([place])    . S/P unilateral BKA (below knee amputation), left ([place]) 04/30/2016  . Unilateral complete BKA, left, initial encounter ([place]) 04/30/2016  . Abnormality of gait    . Coronary artery disease involving coronary bypass graft of native heart without angina pectoris    . Benign essential HTN    . History of migraine    . Leukocytosis    . Critical lower limb ischemia 04/22/2016  . Foot pain 04/21/2016  . Fever    . Advance care planning    . Goals of care, counseling/discussion    . Palliative care by specialist    . Sepsis ([place])    . Hypotension    . Claudication of left lower extremity ([place])    . Elevated troponin 03/05/2016  . Pressure injury of skin, stage 2 11/27/2015  . H/O unilateral nephrectomy 07/06/2015  . Paroxysmal SVT (supraventricular tachycardia) ([place]) 06/22/2014  . Complications due to renal dialysis device, implant, and graft 05/11/2014  . Acute blood loss  anemia 03/31/2014  . Renal dialysis device, implant, or graft complication 24/58/0998  . Acute hyperkalemia    . S/P liver transplant ([place])    . Chronic hepatitis C without hepatic coma ([place])    . Pulmonary edema 01/03/2014  . Respiratory failure ([place]) 01/03/2014  . Long term current use of anticoagulant therapy 04/22/2013  . Chronic anticoagulation w/ coumadin, goal INR 2.5-3.0 w/ AVR 04/14/2013  . Tobacco abuse 02/28/2013  . COPD (chronic obstructive pulmonary disease) ([place]) 02/21/2013  . Chronic combined systolic and diastolic congestive heart failure ([place]) 02/06/2013  . Acute respiratory failure ([place]) 11/02/2012  . Acute pulmonary edema ([place]) 11/02/2012  . Acute on chronic systolic CHF (congestive heart failure) ([place]) 11/02/2012  . Dyslipidemia-LDL 104, not on statin with Hx of liver transplant 07/30/2012  . CAD (coronary artery disease), native coronary artery 07/27/2012  . History of prosthetic aortic valve replacement    . End stage renal disease on dialysis ([place]) 02/27/2011  . Hypothyroidism 06/25/2006  . Type 2 diabetes mellitus with chronic kidney disease ([place]) 06/25/2006  . GERD 06/25/2006  . Anemia due to chronic renal failure     . Hypertensive heart disease        Expected Discharge Date: Expected Discharge Date: 05/13/16   Team Members Present: Physician leading conference: Jenny Reichmann  Sima Matas, PsyD;Dr. Alysia Penna Social Worker Present: Ovidio Kin, LCSW Nurse Present: Elliot Cousin, RN PT Present: Carney Living, PT OT Present: Willeen Cass, OT PPS Coordinator present : Daiva Nakayama, RN, CRRN       Current Status/Progress Goal Weekly Team Focus  Medical     left BKA, phantom pain an issue,   maintain med stability  pain meds, trial stump shrinker   Bowel/Bladder     Anuric, HD M.W.F.,  continent bowel  BM QD, QOD.  No s/s infection, retention. Laxatives as needed.  Monitor   Swallow/Nutrition/ Hydration               ADL's     Min A overall  Mod  I/supervision  pt/family education, functoinal transfers, standing balance, modified bathing/dressing, simple iADL at w/c level   Mobility     min A  supervision-min A  functional transfers, ambulation, standing balance, LLE strengthening/ROM, pain management, pt/family education   Communication     min A sit to stand and stand pivot with RW, min A for balance with LB dressing and bathing  S toileting, toilet transfer to BSC, standing with RW, bathing, LB dressing; mod I UB dressing, grooming, simple meal prep w/c level, and housekeeping w/c level  ADL training, pain tolerance, pt/family education, psychosocial support   Safety/Cognition/ Behavioral Observations   No unsafe behaviors, calls for assist, anticipates needs.  Increased safety awareness, no falls, injury this admission.  Monitor, bed alarm.   Pain     C/O phantom and incisional pain/discomfort.  Dilaudid 2mg  q12hrs. PRN, scheduled robaxin effective.  Managed at goal 2/10.  Monitor, educate re: phantom pain, relief.   Skin     Stage 2 at coccyx, santyl...Rt. heel, great toe boggy, no open areas, cool to touch. Incision with scant serous drainage.  St. 2 resolving at discharge. No further breakdown, injury. Pt. knowledgable re: pressure release, control.  Continue dressing changes, boosting, education.     *See Care Plan and progress notes for long and short-term goals.   Barriers to Discharge: Debility chronic due to CKD, Liver failure     Possible Resolutions to Barriers:  cont rehab     Discharge Planning/Teaching Needs:  Home with daughter's assisting her, daughter's have been here to observe in therapies. Neuro-psych to see today for coping      Team Discussion:  Goals supervision-min assist level, tends to lose her balance posterioraly. Neuro-psych saw and pt seems to be brighter. Back on home pain meds according to MD. Will order stump shrinker. Daughter here daily participating in therapies with pt. Ramp being built on  Tuesday  Revisions to Treatment Plan:  DC 4/3    Continued Need for Acute Rehabilitation Level of Care: The patient requires daily medical management by a physician with specialized training in physical medicine and rehabilitation for the following conditions: Daily direction of a multidisciplinary physical rehabilitation program to ensure safe treatment while eliciting the highest outcome that is of practical value to the patient.: Yes Daily medical management of patient stability for increased activity during participation in an intensive rehabilitation regime.: Yes Daily analysis of laboratory values and/or radiology reports with any subsequent need for medication adjustment of medical intervention for : Neurological problems;Diabetes problems;Post surgical problems   Elease Hashimoto 05/09/2016, 10:40 AM       Patient ID: [name], female   DOB: [birth year], 61 y.o.   MRN: [id number]

## 2016-05-09 NOTE — Progress Notes (Signed)
ANTICOAGULATION CONSULT NOTE - Follow Up Consult  Pharmacy Consult for coumadin Indication: AVR  Allergies  Allergen Reactions  . Acetaminophen Other (See Comments)    Liver transplant recipient   . Morphine Itching and Other (See Comments)    hallucinate  . Codeine Itching  . Mirtazapine Other (See Comments)    hallucination    Patient Measurements: Height: 5\' 4"  (162.6 cm) Weight: 114 lb 1.6 oz (51.8 kg) IBW/kg (Calculated) : 54.7 Heparin Dosing Weight:   Vital Signs: Temp: 98.1 F (36.7 C) (03/30 0545) Temp Source: Oral (03/30 0545) BP: 160/72 (03/30 0600) Pulse Rate: 80 (03/30 0600)  Labs:  Recent Labs  05/07/16 0607 05/07/16 1435 05/08/16 0731 05/09/16 0618  HGB 12.0 11.7* 11.3* 13.8  HCT 35.8* 35.3* 33.9* 40.6  PLT 270 298 248 310  LABPROT 43.3*  --  39.4* 33.9*  INR 4.41*  --  3.92 3.25  CREATININE  --  5.67*  --   --     Estimated Creatinine Clearance: 8.5 mL/min (A) (by C-G formula based on SCr of 5.67 mg/dL (H)).   Medications:  Scheduled:  . aspirin EC  81 mg Oral Daily  . calcitRIOL  0.5 mcg Oral Q M,W,F-HD  . collagenase   Topical Daily  . darbepoetin (ARANESP) injection - DIALYSIS  25 mcg Intravenous Q Mon-HD  . feeding supplement  1 Container Oral TID BM  . feeding supplement (PRO-STAT SUGAR FREE 64)  30 mL Oral BID  . gabapentin  300 mg Oral QHS  . HYDROmorphone  2 mg Oral Once  . insulin aspart  0-5 Units Subcutaneous QHS  . insulin aspart  0-9 Units Subcutaneous TID WC  . levothyroxine  175 mcg Oral QAC breakfast  . methocarbamol  250 mg Oral TID  . metoprolol tartrate  25 mg Oral BID  . multivitamin  1 tablet Oral QHS  . pantoprazole  40 mg Oral Daily  . polyethylene glycol  17 g Oral Daily  . pregabalin  50 mg Oral Daily  . tacrolimus  2 mg Oral BID  . vitamin C  250 mg Oral BID  . Warfarin - Pharmacist Dosing Inpatient   Does not apply q1800  . zinc sulfate  220 mg Oral Daily   Infusions:    Assessment: 61 yo female with  AVR is currently on therapeutic coumadin.  INR is back down to 3.25.  Goal of Therapy:  INR 2.5-3.5 Monitor platelets by anticoagulation protocol: Yes   Plan:  - coumadin 1 mg po x1 - INR in am  Rhylie Stehr, Tsz-Yin 05/09/2016,9:17 AM

## 2016-05-09 NOTE — Progress Notes (Signed)
Physical Therapy Note  Patient Details  Name: GAYLIN OSORIA MRN: [id number] Date of Birth: 05/13/1955 Today's Date: 05/09/2016  Attempted to see patient but patient reports being sick and c/o nausea, RN aware. Patient reports being unable to participate in therapy at this time. Missed 75 min skilled physical therapy. Will f/u as able.    Derhonda Eastlick, Murray Hodgkins 05/09/2016, 10:59 AM

## 2016-05-09 NOTE — Progress Notes (Signed)
Physical Therapy Weekly Progress Note  Patient Details  Name: [name] MRN: [id number] Date of Birth: 1955/09/30  Beginning of progress report period: May 01, 2016 End of progress report period: May 09, 2016  Today's Date: 05/09/2016    Patient has met 3 of 3 short term goals.  Patient making good progress this reporting period but remains inconsistent due to increased residual limb pain and fluctuating confusion, although cognition overall appears improved since beginning of stay. Patient currently requires min A overall but standing balance varies due to posterior lean and patient taking BUE or single UE off RW to complete functional tasks before gaining standing balance. Patient's daughter has been present to observe therapies and plan to initiate hands-on family training this week in preparation for discharge home.   Patient continues to demonstrate the following deficits muscle weakness, muscle joint tightness and pain, decreased cardiorespiratoy endurance and decreased standing balance, decreased postural control and decreased balance strategies and therefore will continue to benefit from skilled PT intervention to increase functional independence with mobility.  Patient progressing toward long term goals.  Continue plan of care.  PT Short Term Goals Week 1:  PT Short Term Goal 1 (Week 1): Patient will perform transfers with consistent min A.  PT Short Term Goal 1 - Progress (Week 1): Met PT Short Term Goal 2 (Week 1): Patient will ambulate 25 ft using RW with min A. PT Short Term Goal 2 - Progress (Week 1): Met PT Short Term Goal 3 (Week 1): Patient will perform sit <> stand using RW with min A. PT Short Term Goal 3 - Progress (Week 1): Met Week 2:  PT Short Term Goal 1 (Week 2): = LTGs due to anticipated LOS   See Function Navigator for Current Functional Status.  Therapy/Group: Individual Therapy  [name], Murray Hodgkins 05/09/2016, 8:13 AM

## 2016-05-09 NOTE — Progress Notes (Signed)
[facility] PHYSICAL MEDICINE & REHABILITATION     PROGRESS NOTE  Subjective/Complaints: Vomited x 2 last noc, no pain in abd, hungry this am, no loose stools, had small formed BM per RN last noc ROS: pt denies nausea,  diarrhea, cough, shortness of breath or chest pain    Objective: Vital Signs: Blood pressure (!) 160/72, pulse 80, temperature 98.1 F (36.7 C), temperature source Oral, resp. rate 18, height 5\' 4"  (1.626 m), weight 51.8 kg (114 lb 1.6 oz), SpO2 95 %. No results found.  Recent Labs  05/08/16 0731 05/09/16 0618  WBC 11.8* 18.9*  HGB 11.3* 13.8  HCT 33.9* 40.6  PLT 248 310    Recent Labs  05/07/16 1435  NA 137  K 3.2*  CL 99*  GLUCOSE 111*  BUN 35*  CREATININE 5.67*  CALCIUM 8.4*   CBG (last 3)   Recent Labs  05/08/16 2126 05/09/16 0341 05/09/16 0707  GLUCAP 142* 145* 208*    Wt Readings from Last 3 Encounters:  05/09/16 51.8 kg (114 lb 1.6 oz)  04/30/16 54.7 kg (120 lb 9.5 oz)  04/17/16 58.7 kg (129 lb 6.4 oz)    Physical Exam:  BP (!) 160/72 (BP Location: Left Arm)   Pulse 80   Temp 98.1 F (36.7 C) (Oral)   Resp 18   Ht 5\' 4"  (1.626 m)   Wt 51.8 kg (114 lb 1.6 oz)   SpO2 95%   BMI 19.59 kg/m  Constitutional: She appears well-developed and well-nourished. NAD. HENT: Normocephalic and atraumatic. Edentulous   Eyes: EOMI. No discharge.  Cardiovascular: RRR Respiratory: normal effort GI: Soft. Bowel sounds are normal.  Musculoskeletal: She exhibits edema and tenderness.  Neurological: She is alert and oriented.  Motor: B/l UE 4/5 RLE: HF 2+/5, KE 3/5, ADF/PF 4/5 (improving) LLE: HF 4/5  Skin: Skin is warm and dry. Dusky extremities unchanged Right toes dusky,with boggy heel.   Stump with staples dried blood medial aspect of incision no active bleeding or drainage  Psychiatric: flat.    Assessment/Plan: 1. Functional deficits secondary to left BKA which require 3+ hours per day of interdisciplinary therapy in a comprehensive  inpatient rehab setting. Physiatrist is providing close team supervision and 24 hour management of active medical problems listed below. Physiatrist and rehab team continue to assess barriers to discharge/monitor patient progress toward functional and medical goals.  Function:  Bathing Bathing position   Position: Wheelchair/chair at sink  Bathing parts Body parts bathed by patient: Right arm, Left arm, Chest, Front perineal area, Abdomen, Buttocks, Right upper leg, Right lower leg, Left upper leg Body parts bathed by helper: Back  Bathing assist Assist Level: Touching or steadying assistance(Pt > 75%)      Upper Body Dressing/Undressing Upper body dressing   What is the patient wearing?: Pull over shirt/dress     Pull over shirt/dress - Perfomed by patient: Thread/unthread right sleeve, Thread/unthread left sleeve          Upper body assist Assist Level: Set up   Set up : To obtain clothing/put away  Lower Body Dressing/Undressing Lower body dressing   What is the patient wearing?: Underwear, Pants Underwear - Performed by patient: Thread/unthread right underwear leg, Thread/unthread left underwear leg, Pull underwear up/down Underwear - Performed by helper: Pull underwear up/down Pants- Performed by patient: Thread/unthread right pants leg, Thread/unthread left pants leg, Pull pants up/down Pants- Performed by helper: Thread/unthread right pants leg, Thread/unthread left pants leg, Pull pants up/down Non-skid slipper socks- Performed  by patient: Don/doff right sock Non-skid slipper socks- Performed by helper: Don/doff right sock                  Lower body assist Assist for lower body dressing: Touching or steadying assistance (Pt > 75%)      Toileting Toileting Toileting activity did not occur:  (does not void, no bm today)        Toileting assist     Transfers Chair/bed transfer   Chair/bed transfer method: Ambulatory Chair/bed transfer assist level:  Touching or steadying assistance (Pt > 75%) Chair/bed transfer assistive device: Armrests, Medical sales representative     Max distance: 36 ft Assist level: Touching or steadying assistance (Pt > 75%)   Wheelchair   Type: Manual Max wheelchair distance: 130 ft Assist Level: Supervision or verbal cues  Cognition Comprehension Comprehension assist level: Understands complex 90% of the time/cues 10% of the time  Expression Expression assist level: Expresses basic needs/ideas: With no assist  Social Interaction Social Interaction assist level: Interacts appropriately 90% of the time - Needs monitoring or encouragement for participation or interaction.  Problem Solving Problem solving assist level: Solves basic 50 - 74% of the time/requires cueing 25 - 49% of the time  Memory Memory assist level: Recognizes or recalls 75 - 89% of the time/requires cueing 10 - 24% of the time    Medical Problem List and Plan: 1.  Gait abnormality, difficulty with transfers, limitation with self-care secondary to left BKA.  Cont CIR PT, OT-   2.  DVT Prophylaxis/Anticoagulation: Pharmaceutical:  Coumadin --adjustment per pharmacy 3. Chronic back pain/Pain Management: reduce  Gabapentin, increase  Lyrica. Used dilaudid at home. Dilaudid  Monitor with increased activity  Lidocaine ointment for pressure sores  -suspect chronic pain/behavioral component. Neuropsych recommended  -added robaxin for muscle spasms.  4. Mood: Improving with decrease in pain. LCSW to follow for evaluation and support.  5. Neuropsych: This patient is capable of making decisions on her own behalf. 6. Skin/Wound Care: Routine wound care. Stump healing-Appreciate VVS note  -pressure sore RIght heel- PRAFO 7. Fluids/Electrolytes/Nutrition: Strict I/O. Serial labs with HD to monitor lytes 8. ESRD: Monitor weight daily with strict I/O. Schedule HD late afternoons to help with tolerance of activity during the day.  9. CAD s/p CABG  with AVR: Cont meds 10. 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 T2DM: Monitor BS ac/hs. Po intake poor--used 2 units levemir daily prn BS > 150. Will use SSI for elevated BS until intake is consistent.  controlled   CBG (last 3)   Recent Labs  05/08/16 2126 05/09/16 0341 05/09/16 0707  GLUCAP 142* 145* 208*    12. HTN: Monitor bid.   Labile at times, monitor in accordance with HD per nephro 13. H/o Migraines: Managed with prn medications.  14. Anemia of chronic disease: Aranesp weekly with transfusion prn.   Hb 13.3 on 3/26  Improved- Aranesp per nephro 15. COPD: Respiratory status stable.  16. Leucocytosis:.   WBCs 12.5 3/26  afebrile 17.  PVD Right toes cool and dusky but no rest pain LOS (Days) 9 A FACE TO FACE EVALUATION WAS PERFORMED  [name] 05/09/2016 8:29 AM

## 2016-05-09 NOTE — Progress Notes (Signed)
Physical Therapy Note  Patient Details  Name: [name] MRN: [id number] Date of Birth: [birth year] Today's Date: 05/09/2016  Attempted to see patient with daughter at bedside. Patient continues c/o being sick, stating her stomach and head hurt and continued nausea/vomiting. Patient missed 60 min skilled physical therapy. Will f/u as able.    [name], Murray Hodgkins 05/09/2016, 2:35 PM

## 2016-05-10 ENCOUNTER — Inpatient Hospital Stay (HOSPITAL_COMMUNITY): Payer: Medicare Other

## 2016-05-10 DIAGNOSIS — N186 End stage renal disease: Secondary | ICD-10-CM | POA: Diagnosis not present

## 2016-05-10 DIAGNOSIS — Z992 Dependence on renal dialysis: Secondary | ICD-10-CM | POA: Diagnosis not present

## 2016-05-10 DIAGNOSIS — T8640 Unspecified complication of liver transplant: Secondary | ICD-10-CM | POA: Diagnosis not present

## 2016-05-10 LAB — CBC
HCT: 39.5 % (ref 36.0–46.0)
HEMOGLOBIN: 13.4 g/dL (ref 12.0–15.0)
MCH: 29.9 pg (ref 26.0–34.0)
MCHC: 33.9 g/dL (ref 30.0–36.0)
MCV: 88.2 fL (ref 78.0–100.0)
PLATELETS: 312 10*3/uL (ref 150–400)
RBC: 4.48 MIL/uL (ref 3.87–5.11)
RDW: 17.6 % — ABNORMAL HIGH (ref 11.5–15.5)
WBC: 16.1 10*3/uL — AB (ref 4.0–10.5)

## 2016-05-10 LAB — GLUCOSE, CAPILLARY
GLUCOSE-CAPILLARY: 139 mg/dL — AB (ref 65–99)
GLUCOSE-CAPILLARY: 113 mg/dL — AB (ref 65–99)
GLUCOSE-CAPILLARY: 117 mg/dL — AB (ref 65–99)
GLUCOSE-CAPILLARY: 110 mg/dL — AB (ref 65–99)
GLUCOSE-CAPILLARY: 131 mg/dL — AB (ref 65–99)

## 2016-05-10 LAB — PROTIME-INR
INR: 2.77
PROTHROMBIN TIME: 29.9 s — AB (ref 11.4–15.2)

## 2016-05-10 MED ORDER — ONDANSETRON HCL 4 MG PO TABS
2.0000 mg | ORAL_TABLET | Freq: Four times a day (QID) | ORAL | Status: DC | PRN
  Administered 2016-05-10: 2 mg via ORAL
  Filled 2016-05-10: qty 1

## 2016-05-10 MED ORDER — WARFARIN SODIUM 2 MG PO TABS
2.0000 mg | ORAL_TABLET | Freq: Once | ORAL | Status: AC
  Administered 2016-05-10: 2 mg via ORAL
  Filled 2016-05-10: qty 1

## 2016-05-10 NOTE — Progress Notes (Signed)
Occupational Therapy Session Note  Patient Details  Name: [name] MRN: [id number] Date of Birth: 12/31/1955  Today's Date: 05/10/2016 OT Individual Time: 9518-8416 OT Individual Time Calculation (min): 53 min    Short Term Goals: Week 2:  OT Short Term Goal 1 (Week 2): LTG=STG 2/2 estimated LOS  Skilled Therapeutic Interventions/Progress Updates:    1:1. Pt agreeable to session with encouragement. Pt c/o nausea. RN aware. Pt squat pivot transfer EOB<>w/c with VC for sequencing and safety awareness. Pt stand pivot transfer w/c <> BSC with MIN A for balance and VC for LE management using grab bar and arm rest. Pt stands with MIN A for balance and advances brief pants hips. With increased time pt voids bowel and completes posterior hygiene. Pt stands and OT provides A to advance pants past hips d/t fatigue. Seated in w/c at sink pt washes hands, face, and UB with wash cloth with supervision and Vc to scan for soap on sink. Pt has episode of dry heaving without producing vomit. Pt brushes teeth in w/c in sitting with supervision and significantly increased time. Pt folds laundry and places into drawers. Nurse tech checks vitals resulting in BP 186/67. RN notified and requests pt take prolonged seated rest break. During break OT educates pt on stress management and pursed lip breathing to calm breath. RN requests pt return to bed and Pt squat pivot transfer back to bed as stated before. Exited session with RN in room and pt supine in bed with call light in reach.   Therapy Documentation Precautions:  Precautions Precautions: Fall Required Braces or Orthoses: Knee Immobilizer - Left Knee Immobilizer - Left: Other (comment) (on except in PT) Restrictions Weight Bearing Restrictions: Yes LLE Weight Bearing: Non weight bearing General:    See Function Navigator for Current Functional Status.   Therapy/Group: Individual Therapy  Tonny Branch 05/10/2016, 3:36 PM

## 2016-05-10 NOTE — Progress Notes (Signed)
After administration of am meds, pt vomit all meds. MD order zofran prn

## 2016-05-10 NOTE — Progress Notes (Signed)
Pt continues to experience nausea. x1 episode of emesis on shift. Have rotated phenergan and zofran  Pt tolerated meds well with ginger ale. Bowel sounds are active. LBM 05/10/16, noted by physical therapy "formed,brown."

## 2016-05-10 NOTE — Progress Notes (Signed)
[facility] PHYSICAL MEDICINE & REHABILITATION     PROGRESS NOTE  Subjective/Complaints: No issues overnite except nausea and diarrhea, pt states she vomited once but not reported by RN ROS: pt denies nausea,  diarrhea, cough, shortness of breath or chest pain    Objective: Vital Signs: Blood pressure (!) 163/57, pulse 84, temperature 98.4 F (36.9 C), temperature source Oral, resp. rate 18, height 5\' 4"  (1.626 m), weight 49.9 kg (110 lb), SpO2 94 %. No results found.  Recent Labs  05/09/16 1856 05/10/16 0502  WBC 18.2* 16.1*  HGB 12.5 13.4  HCT 36.2 39.5  PLT 329 312    Recent Labs  05/07/16 1435 05/09/16 1855  NA 137 134*  K 3.2* 4.9  CL 99* 96*  GLUCOSE 111* 110*  BUN 35* 30*  CREATININE 5.67* 5.69*  CALCIUM 8.4* 8.5*   CBG (last 3)   Recent Labs  05/09/16 2229 05/10/16 0302 05/10/16 0640  GLUCAP 119* 139* 113*    Wt Readings from Last 3 Encounters:  05/10/16 49.9 kg (110 lb)  04/30/16 54.7 kg (120 lb 9.5 oz)  04/17/16 58.7 kg (129 lb 6.4 oz)    Physical Exam:  BP (!) 163/57 (BP Location: Left Arm)   Pulse 84   Temp 98.4 F (36.9 C) (Oral)   Resp 18   Ht 5\' 4"  (1.626 m)   Wt 49.9 kg (110 lb)   SpO2 94%   BMI 18.88 kg/m  Constitutional: She appears well-developed and well-nourished. NAD. HENT: Normocephalic and atraumatic. Edentulous   Eyes: EOMI. No discharge.  Cardiovascular: RRR Respiratory: normal effort GI: Soft. Bowel sounds are normal.  Musculoskeletal: She exhibits edema and tenderness.  Neurological: She is alert and oriented.  Motor: B/l UE 4/5 RLE: HF 2+/5, KE 3/5, ADF/PF 4/5 (improving) LLE: HF 4/5  Skin: Skin is warm and dry. Dusky extremities unchanged Right toes dusky,with boggy heel.   Stump with staples dried blood medial aspect of incision no active bleeding or drainage  Psychiatric: flat.    Assessment/Plan: 1. Functional deficits secondary to left BKA which require 3+ hours per day of interdisciplinary therapy in  a comprehensive inpatient rehab setting. Physiatrist is providing close team supervision and 24 hour management of active medical problems listed below. Physiatrist and rehab team continue to assess barriers to discharge/monitor patient progress toward functional and medical goals.  Function:  Bathing Bathing position   Position: Wheelchair/chair at sink  Bathing parts Body parts bathed by patient: Right arm, Left arm, Chest, Front perineal area, Abdomen, Buttocks, Right upper leg, Right lower leg, Left upper leg Body parts bathed by helper: Back  Bathing assist Assist Level: Touching or steadying assistance(Pt > 75%)      Upper Body Dressing/Undressing Upper body dressing   What is the patient wearing?: Pull over shirt/dress     Pull over shirt/dress - Perfomed by patient: Thread/unthread right sleeve, Thread/unthread left sleeve          Upper body assist Assist Level: Set up   Set up : To obtain clothing/put away  Lower Body Dressing/Undressing Lower body dressing   What is the patient wearing?: Underwear, Pants Underwear - Performed by patient: Thread/unthread right underwear leg, Thread/unthread left underwear leg, Pull underwear up/down Underwear - Performed by helper: Pull underwear up/down Pants- Performed by patient: Thread/unthread right pants leg, Thread/unthread left pants leg, Pull pants up/down Pants- Performed by helper: Thread/unthread right pants leg, Thread/unthread left pants leg, Pull pants up/down Non-skid slipper socks- Performed by patient: Don/doff  right sock Non-skid slipper socks- Performed by helper: Don/doff right sock                  Lower body assist Assist for lower body dressing: Touching or steadying assistance (Pt > 75%)      Toileting Toileting Toileting activity did not occur:  (does not void, no bm today)        Toileting assist     Transfers Chair/bed transfer   Chair/bed transfer method: Ambulatory Chair/bed transfer  assist level: Touching or steadying assistance (Pt > 75%) Chair/bed transfer assistive device: Armrests, Medical sales representative     Max distance: 36 ft Assist level: Touching or steadying assistance (Pt > 75%)   Wheelchair   Type: Manual Max wheelchair distance: 130 ft Assist Level: Supervision or verbal cues  Cognition Comprehension Comprehension assist level: Understands complex 90% of the time/cues 10% of the time  Expression Expression assist level: Expresses basic needs/ideas: With no assist  Social Interaction Social Interaction assist level: Interacts appropriately 90% of the time - Needs monitoring or encouragement for participation or interaction.  Problem Solving Problem solving assist level: Solves basic 50 - 74% of the time/requires cueing 25 - 49% of the time  Memory Memory assist level: Recognizes or recalls 75 - 89% of the time/requires cueing 10 - 24% of the time    Medical Problem List and Plan: 1.  Gait abnormality, difficulty with transfers, limitation with self-care secondary to left BKA.  Cont CIR PT, OT-   2.  DVT Prophylaxis/Anticoagulation: Pharmaceutical:  Coumadin --adjustment per pharmacy 3. Chronic back pain/Pain Management: reduce  Gabapentin, increase  Lyrica. Used dilaudid at home. Dilaudid  Monitor with increased activity  Lidocaine ointment for pressure sores  -suspect chronic pain/behavioral component. Neuropsych recommended  -added robaxin for muscle spasms.  4. Mood: Improving with decrease in pain. LCSW to follow for evaluation and support.  5. Neuropsych: This patient is capable of making decisions on her own behalf. 6. Skin/Wound Care: Routine wound care. Stump healing-Appreciate VVS note  -pressure sore RIght heel- PRAFO 7. Fluids/Electrolytes/Nutrition: Strict I/O. Serial labs with HD to monitor lytes 8. ESRD: Monitor weight daily with strict I/O. Schedule HD late afternoons to help with tolerance of activity during the day.   9. CAD s/p CABG with AVR: Cont meds 10. 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 T2DM: Monitor BS ac/hs. Po intake poor--used 2 units levemir daily prn BS > 150. Will use SSI for elevated BS until intake is consistent.  controlled   CBG (last 3)   Recent Labs  05/09/16 2229 05/10/16 0302 05/10/16 0640  GLUCAP 119* 139* 113*    12. HTN: Monitor bid.   Labile at times, monitor in accordance with HD per nephro 13. H/o Migraines: Managed with prn medications.  14. Anemia of chronic disease: Aranesp weekly with transfusion prn.   Hb 13.3 on 3/26  Improved- Aranesp per nephro 15. COPD: Respiratory status stable.  16. Leucocytosis:.   WBCs 12.5 3/26  afebrile 17.  PVD Right toes cool and dusky but no rest pain 18.  Nausea-also some diarrhea noted may be viral but has hx of both renal and hepatic failure LOS (Days) 10 A FACE TO FACE EVALUATION WAS PERFORMED  Charlett Blake 05/10/2016 8:47 AM

## 2016-05-10 NOTE — Progress Notes (Signed)
  [name] Progress Note   Subjective:  Seen in room. Still with N/V, meds not helping. No CP or dyspnea currently.  Objective Vitals:   05/09/16 2130 05/09/16 2233 05/10/16 0500 05/10/16 0820  BP: (!) 160/76 (!) 158/63 (!) 167/58 (!) 163/57  Pulse: 86 84 85 84  Resp:  16 16 18   Temp:  98.3 F (36.8 C) 98.4 F (36.9 C)   TempSrc:  Oral Oral   SpO2:  98% 94% 94%  Weight:   49.9 kg (110 lb)   Height:       Physical Exam General: Frail female, NAD. Heart: RRR; no murmur Lungs: CTAB Extremities: No stump edema Dialysis Access: TDC in R chest  Additional Objective Labs: Basic Metabolic Panel:  Recent Labs Lab 05/05/16 1549 05/07/16 1435 05/09/16 1855  NA 133* 137 134*  K 3.6 3.2* 4.9  CL 97* 99* 96*  CO2 27 27 25   GLUCOSE 97 111* 110*  BUN 37* 35* 30*  CREATININE 6.13* 5.67* 5.69*  CALCIUM 8.7* 8.4* 8.5*  PHOS 3.8 5.0* 4.9*   Liver Function Tests:  Recent Labs Lab 05/05/16 1549 05/07/16 1435 05/09/16 1855  ALBUMIN 1.5* 1.5* 1.7*   CBC:  Recent Labs Lab 05/07/16 1435 05/08/16 0731 05/09/16 0618 05/09/16 1856 05/10/16 0502  WBC 12.1* 11.8* 18.9* 18.2* 16.1*  HGB 11.7* 11.3* 13.8 12.5 13.4  HCT 35.3* 33.9* 40.6 36.2 39.5  MCV 89.4 88.5 88.5 86.8 88.2  PLT 298 248 310 329 312   CBG:  Recent Labs Lab 05/09/16 1155 05/09/16 1627 05/09/16 2229 05/10/16 0302 05/10/16 0640  GLUCAP 157* 113* 119* 139* 113*   Medications:  . aspirin EC  81 mg Oral Daily  . calcitRIOL  0.5 mcg Oral Q M,W,F-HD  . collagenase   Topical Daily  . darbepoetin (ARANESP) injection - DIALYSIS  25 mcg Intravenous Q Mon-HD  . feeding supplement  1 Container Oral TID BM  . feeding supplement (PRO-STAT SUGAR FREE 64)  30 mL Oral BID  . gabapentin  300 mg Oral QHS  . HYDROmorphone  2 mg Oral Once  . insulin aspart  0-5 Units Subcutaneous QHS  . insulin aspart  0-9 Units Subcutaneous TID WC  . levothyroxine  175 mcg Oral QAC breakfast  . methocarbamol   250 mg Oral TID  . metoprolol tartrate  25 mg Oral BID  . multivitamin  1 tablet Oral QHS  . pantoprazole  40 mg Oral Daily  . polyethylene glycol  17 g Oral Daily  . pregabalin  50 mg Oral Daily  . tacrolimus  2 mg Oral BID  . warfarin  2 mg Oral ONCE-1800  . Warfarin - Pharmacist Dosing Inpatient   Does not apply q1800  . zinc sulfate  220 mg Oral Daily    Dialysis Orders: [name] MWF 3h 81min 2/2 bath 58kg Hep none R IJ cath  Assessment/Plan: 1. 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 Debility, improved 3. ESRD MWF HD: Due for HD 4/2. 4. BP/Volume: BP slightly high, no volume excess. 5. Hx AVR on coumadin 6. Hx liver Tx - on prograf only  7. Leukocytosis 8.   Nausea: Persistent today, says antiemetics aren't helping. Work-up per primary.   [name] Penton, PA-C 05/10/2016, 11:44 AM  [facility] Pager: [phone]   Renal Attending: She is bothered by nausea of uncertain cause.  I agree with the note above. [name] C

## 2016-05-10 NOTE — Progress Notes (Signed)
ANTICOAGULATION CONSULT NOTE - Follow Up Consult  Pharmacy Consult for coumadin Indication: AVR  Allergies  Allergen Reactions  . Acetaminophen Other (See Comments)    Liver transplant recipient   . Morphine Itching and Other (See Comments)    hallucinate  . Codeine Itching  . Mirtazapine Other (See Comments)    hallucination    Patient Measurements: Height: 5\' 4"  (162.6 cm) Weight: 110 lb (49.9 kg) IBW/kg (Calculated) : 54.7 Heparin Dosing Weight:   Vital Signs: Temp: 98.4 F (36.9 C) (03/31 0500) Temp Source: Oral (03/31 0500) BP: 163/57 (03/31 0820) Pulse Rate: 84 (03/31 0820)  Labs:  Recent Labs  05/07/16 1435 05/08/16 0731 05/09/16 0618 05/09/16 1855 05/09/16 1856 05/10/16 0502  HGB 11.7* 11.3* 13.8  --  12.5 13.4  HCT 35.3* 33.9* 40.6  --  36.2 39.5  PLT 298 248 310  --  329 312  LABPROT  --  39.4* 33.9*  --   --  29.9*  INR  --  3.92 3.25  --   --  2.77  CREATININE 5.67*  --   --  5.69*  --   --     Estimated Creatinine Clearance: 8.2 mL/min (A) (by C-G formula based on SCr of 5.69 mg/dL (H)).   Medications:  Scheduled:  . aspirin EC  81 mg Oral Daily  . calcitRIOL  0.5 mcg Oral Q M,W,F-HD  . collagenase   Topical Daily  . darbepoetin (ARANESP) injection - DIALYSIS  25 mcg Intravenous Q Mon-HD  . feeding supplement  1 Container Oral TID BM  . feeding supplement (PRO-STAT SUGAR FREE 64)  30 mL Oral BID  . gabapentin  300 mg Oral QHS  . HYDROmorphone  2 mg Oral Once  . insulin aspart  0-5 Units Subcutaneous QHS  . insulin aspart  0-9 Units Subcutaneous TID WC  . levothyroxine  175 mcg Oral QAC breakfast  . methocarbamol  250 mg Oral TID  . metoprolol tartrate  25 mg Oral BID  . multivitamin  1 tablet Oral QHS  . pantoprazole  40 mg Oral Daily  . polyethylene glycol  17 g Oral Daily  . pregabalin  50 mg Oral Daily  . tacrolimus  2 mg Oral BID  . Warfarin - Pharmacist Dosing Inpatient   Does not apply q1800  . zinc sulfate  220 mg Oral Daily    Infusions:    Assessment: 61 yo female with AVR is currently on therapeutic coumadin.  INR today is down to 2.77.   Goal of Therapy:  INR 2.5-3.5 Monitor platelets by anticoagulation protocol: Yes   Plan:  - coumadin 2 mg po x1 - INR in am  Malaney Mcbean, Tsz-Yin 05/10/2016,11:23 AM

## 2016-05-11 ENCOUNTER — Inpatient Hospital Stay (HOSPITAL_COMMUNITY): Payer: Medicare Other

## 2016-05-11 ENCOUNTER — Inpatient Hospital Stay (HOSPITAL_COMMUNITY): Payer: Medicare Other | Admitting: Physical Therapy

## 2016-05-11 DIAGNOSIS — I509 Heart failure, unspecified: Secondary | ICD-10-CM

## 2016-05-11 HISTORY — DX: Heart failure, unspecified: I50.9

## 2016-05-11 LAB — GLUCOSE, CAPILLARY
GLUCOSE-CAPILLARY: 116 mg/dL — AB (ref 65–99)
GLUCOSE-CAPILLARY: 51 mg/dL — AB (ref 65–99)
GLUCOSE-CAPILLARY: 91 mg/dL (ref 65–99)
Glucose-Capillary: 119 mg/dL — ABNORMAL HIGH (ref 65–99)
Glucose-Capillary: 137 mg/dL — ABNORMAL HIGH (ref 65–99)

## 2016-05-11 LAB — CBC
HCT: 40.9 % (ref 36.0–46.0)
HEMOGLOBIN: 13.5 g/dL (ref 12.0–15.0)
MCH: 29 pg (ref 26.0–34.0)
MCHC: 33 g/dL (ref 30.0–36.0)
MCV: 88 fL (ref 78.0–100.0)
Platelets: 267 10*3/uL (ref 150–400)
RBC: 4.65 MIL/uL (ref 3.87–5.11)
RDW: 17.8 % — ABNORMAL HIGH (ref 11.5–15.5)
WBC: 15.9 10*3/uL — ABNORMAL HIGH (ref 4.0–10.5)

## 2016-05-11 LAB — PROTIME-INR
INR: 3.22
PROTHROMBIN TIME: 33.6 s — AB (ref 11.4–15.2)

## 2016-05-11 MED ORDER — WARFARIN SODIUM 1 MG PO TABS
1.0000 mg | ORAL_TABLET | Freq: Once | ORAL | Status: AC
  Administered 2016-05-11: 1 mg via ORAL
  Filled 2016-05-11: qty 1

## 2016-05-11 NOTE — Progress Notes (Signed)
  [facility] Progress Note   Subjective:  Resting quietly. Says N/V is slightly better. Denies dyspnea or CP currently.   Objective Vitals:   05/10/16 0500 05/10/16 0820 05/10/16 2210 05/11/16 0458  BP: (!) 167/58 (!) 163/57 (!) 178/64 (!) 188/66  Pulse: 85 84 86 (!) 116  Resp: 16 18  20   Temp: 98.4 F (36.9 C)   98.5 F (36.9 C)  TempSrc: Oral   Oral  SpO2: 94% 94%  98%  Weight: 49.9 kg (110 lb)   49.9 kg (110 lb 0.2 oz)  Height:       Physical Exam General: Frail female, NAD. Heart: RRR; no murmur Lungs: CTAB Extremities: No stump edema Dialysis Access: TDC in R chest  Additional Objective Labs: Basic Metabolic Panel:  Recent Labs Lab 05/05/16 1549 05/07/16 1435 05/09/16 1855  NA 133* 137 134*  K 3.6 3.2* 4.9  CL 97* 99* 96*  CO2 27 27 25   GLUCOSE 97 111* 110*  BUN 37* 35* 30*  CREATININE 6.13* 5.67* 5.69*  CALCIUM 8.7* 8.4* 8.5*  PHOS 3.8 5.0* 4.9*   Liver Function Tests:  Recent Labs Lab 05/05/16 1549 05/07/16 1435 05/09/16 1855  ALBUMIN 1.5* 1.5* 1.7*   CBC:  Recent Labs Lab 05/08/16 0731 05/09/16 0618 05/09/16 1856 05/10/16 0502 05/11/16 0545  WBC 11.8* 18.9* 18.2* 16.1* 15.9*  HGB 11.3* 13.8 12.5 13.4 13.5  HCT 33.9* 40.6 36.2 39.5 40.9  MCV 88.5 88.5 86.8 88.2 88.0  PLT 248 310 329 312 267   CBG:  Recent Labs Lab 05/10/16 1149 05/10/16 1634 05/10/16 2034 05/11/16 0638 05/11/16 1136  GLUCAP 117* 110* 131* 116* 119*   Medications:  . aspirin EC  81 mg Oral Daily  . calcitRIOL  0.5 mcg Oral Q M,W,F-HD  . collagenase   Topical Daily  . darbepoetin (ARANESP) injection - DIALYSIS  25 mcg Intravenous Q Mon-HD  . feeding supplement  1 Container Oral TID BM  . feeding supplement (PRO-STAT SUGAR FREE 64)  30 mL Oral BID  . gabapentin  300 mg Oral QHS  . HYDROmorphone  2 mg Oral Once  . insulin aspart  0-5 Units Subcutaneous QHS  . insulin aspart  0-9 Units Subcutaneous TID WC  . levothyroxine  175 mcg Oral QAC  breakfast  . methocarbamol  250 mg Oral TID  . metoprolol tartrate  25 mg Oral BID  . multivitamin  1 tablet Oral QHS  . pantoprazole  40 mg Oral Daily  . polyethylene glycol  17 g Oral Daily  . pregabalin  50 mg Oral Daily  . tacrolimus  2 mg Oral BID  . warfarin  1 mg Oral ONCE-1800  . Warfarin - Pharmacist Dosing Inpatient   Does not apply q1800  . zinc sulfate  220 mg Oral Daily    Dialysis Orders: [name] MWF 3h 25min 2/2 bath 58kg Hep none R IJ cath  Assessment/Plan: 1. 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 Debility, improving slowly 3. ESRD MWF HD: Due for HD on 4/2. 4. BP/Volume: BP slightly high, no volume excess. 5. Hx AVR: on coumadin 6. Hx liver Tx: on prograf only  7. Leukocytosis 8. Nausea: Better today, per primary. 9.   Nutrition: Alb 1.7, continue protein supps.  Veneta Penton, PA-C 05/11/2016, 12:06 PM  [facility] Pager: [phone]   Renal Attending:  I agree with the note above by Ms Stovall. Audri Kozub C

## 2016-05-11 NOTE — Progress Notes (Signed)
Occupational Therapy Session Note  Patient Details  Name: [name] MRN: [id number] Date of Birth: 09-13-1955  Today's Date: 05/11/2016 OT Missed Time:  60  min Missed Time Reason:  refusal/ill/fatigue   Short Term Goals: Week 1:  OT Short Term Goal 1 (Week 1): Pt will complete all bathing sit to stand with supervision.  OT Short Term Goal 1 - Progress (Week 1): Progressing toward goal OT Short Term Goal 2 (Week 1): Pt will completed toilet transfers and toileting with supervision sit to stand.  OT Short Term Goal 2 - Progress (Week 1): Progressing toward goal OT Short Term Goal 3 (Week 1): Pt will complete LB dressing sit to stand with supervision.  OT Short Term Goal 3 - Progress (Week 1): Progressing toward goal OT Short Term Goal 4 (Week 1): Pt will perform standing at the sink for 2 mins with no more than supervision during completion of grooming tasks.   OT Short Term Goal 4 - Progress (Week 1): Progressing toward goal  Skilled Therapeutic Interventions/Progress Updates:   Pt missed 60 min of skilled occupational therapy services d/t fatigue, nausea and pain. Educated pt on importance of participating in therapy. Attempted to engage in therapy/follow up 3x this date, but pt refused each time d/t illness.   Therapy Documentation Precautions:  Precautions Precautions: Fall Required Braces or Orthoses: Knee Immobilizer - Left Knee Immobilizer - Left: Other (comment) (on except in PT) Restrictions Weight Bearing Restrictions: Yes LLE Weight Bearing: Non weight bearing General:   Vital Signs: Therapy Vitals Temp: 98.2 F (36.8 C) Temp Source: Oral Pulse Rate: 68 Resp: 19 BP: (!) 177/62 Patient Position (if appropriate): Lying Oxygen Therapy SpO2: 100 % O2 Device: Not Delivered Pain:    See Function Navigator for Current Functional Status.   Therapy/Group: Individual Therapy  Tonny Branch 05/11/2016, 5:30 PM

## 2016-05-11 NOTE — Progress Notes (Signed)
[facility]     PROGRESS NOTE  Subjective/Complaints: No issues overnite, nausea better phantom pain better      ROS: pt denies nausea,  diarrhea, cough, shortness of breath or chest pain    Objective: Vital Signs: Blood pressure (!) 188/66, pulse (!) 116, temperature 98.5 F (36.9 C), temperature source Oral, resp. rate 20, height 5\' 4"  (1.626 m), weight 49.9 kg (110 lb 0.2 oz), SpO2 98 %. No results found.  Recent Labs  05/10/16 0502 05/11/16 0545  WBC 16.1* 15.9*  HGB 13.4 13.5  HCT 39.5 40.9  PLT 312 267    Recent Labs  05/09/16 1855  NA 134*  K 4.9  CL 96*  GLUCOSE 110*  BUN 30*  CREATININE 5.69*  CALCIUM 8.5*   CBG (last 3)   Recent Labs  05/10/16 1634 05/10/16 2034 05/11/16 0638  GLUCAP 110* 131* 116*    Wt Readings from Last 3 Encounters:  05/11/16 49.9 kg (110 lb 0.2 oz)  04/30/16 54.7 kg (120 lb 9.5 oz)  04/17/16 58.7 kg (129 lb 6.4 oz)    Physical Exam:  BP (!) 188/66 (BP Location: Left Arm)   Pulse (!) 116   Temp 98.5 F (36.9 C) (Oral)   Resp 20   Ht 5\' 4"  (1.626 m)   Wt 49.9 kg (110 lb 0.2 oz)   SpO2 98%   BMI 18.88 kg/m  Constitutional: She appears well-developed and well-nourished. NAD. HENT: Normocephalic and atraumatic. Edentulous   Eyes: EOMI. No discharge.  Cardiovascular: RRR Respiratory: normal effort GI: Soft. Bowel sounds are normal.  Musculoskeletal: She exhibits edema and tenderness.  Neurological: She is alert and oriented.  Motor: B/l UE 4/5 RLE: HF 2+/5, KE 3/5, ADF/PF 4/5 (improving) LLE: HF 4/5  Skin: Skin is warm and dry. Dusky extremities unchanged Right toes dusky,with boggy heel.   Stump with staples dried blood medial aspect of incision no active bleeding or drainage  Psychiatric: flat.    Assessment/Plan: 1. Functional deficits secondary to left BKA which require 3+ hours per day of interdisciplinary therapy in a comprehensive inpatient rehab setting. Physiatrist is  providing close team supervision and 24 hour management of active medical problems listed below. Physiatrist and rehab team continue to assess barriers to discharge/monitor patient progress toward functional and medical goals.  Function:  Bathing Bathing position   Position: Wheelchair/chair at sink  Bathing parts Body parts bathed by patient: Right arm, Left arm, Chest, Abdomen Body parts bathed by helper: Back  Bathing assist Assist Level: Supervision or verbal cues (pt decline LB bathing)      Upper Body Dressing/Undressing Upper body dressing   What is the patient wearing?: Pull over shirt/dress     Pull over shirt/dress - Perfomed by patient: Thread/unthread right sleeve, Thread/unthread left sleeve          Upper body assist Assist Level: Set up   Set up : To obtain clothing/put away  Lower Body Dressing/Undressing Lower body dressing   What is the patient wearing?: Underwear, Pants Underwear - Performed by patient: Thread/unthread right underwear leg, Thread/unthread left underwear leg, Pull underwear up/down Underwear - Performed by helper: Pull underwear up/down Pants- Performed by patient: Thread/unthread right pants leg, Thread/unthread left pants leg, Pull pants up/down Pants- Performed by helper: Thread/unthread right pants leg, Thread/unthread left pants leg, Pull pants up/down Non-skid slipper socks- Performed by patient: Don/doff right sock Non-skid slipper socks- Performed by helper: Don/doff right sock  Lower body assist Assist for lower body dressing: Touching or steadying assistance (Pt > 75%)      Toileting Toileting Toileting activity did not occur:  (does not void, no bm today) Toileting steps completed by patient: Adjust clothing prior to toileting, Performs perineal hygiene Toileting steps completed by helper: Adjust clothing after toileting Toileting Assistive Devices: Grab bar or rail  Toileting assist Assist level:  (MOD  A)   Transfers Chair/bed transfer   Chair/bed transfer method: Squat pivot Chair/bed transfer assist level: Touching or steadying assistance (Pt > 75%) Chair/bed transfer assistive device: Armrests, Medical sales representative     Max distance: 36 ft Assist level: Touching or steadying assistance (Pt > 75%)   Wheelchair   Type: Manual Max wheelchair distance: 130 ft Assist Level: Supervision or verbal cues  Cognition Comprehension Comprehension assist level: Understands complex 90% of the time/cues 10% of the time  Expression Expression assist level: Expresses basic needs/ideas: With no assist  Social Interaction Social Interaction assist level: Interacts appropriately 90% of the time - Needs monitoring or encouragement for participation or interaction.  Problem Solving Problem solving assist level: Solves basic 50 - 74% of the time/requires cueing 25 - 49% of the time  Memory Memory assist level: Recognizes or recalls 75 - 89% of the time/requires cueing 10 - 24% of the time    Medical Problem List and Plan: 1.  Gait abnormality, difficulty with transfers, limitation with self-care secondary to left BKA.  Cont CIR PT, OT-   2.  DVT Prophylaxis/Anticoagulation: Pharmaceutical:  Coumadin --adjustment per pharmacy 3. Chronic back pain/Pain Management: reduce  Gabapentin, increase  Lyrica. Used dilaudid at home. Dilaudid  Monitor with increased activity  Lidocaine ointment for pressure sores  -suspect chronic pain/behavioral component. Neuropsych recommended  -added robaxin for muscle spasms.  4. Mood: Improving with decrease in pain. LCSW to follow for evaluation and support.  5. Neuropsych: This patient is capable of making decisions on her own behalf. 6. Skin/Wound Care: Routine wound care. Stump healing-Appreciate VVS note  -pressure sore RIght heel- PRAFO 7. Fluids/Electrolytes/Nutrition: Strict I/O. Serial labs with HD to monitor lytes 8. ESRD: Monitor weight daily  with strict I/O. Schedule HD late afternoons to help with tolerance of activity during the day.  9. CAD s/p CABG with AVR: Cont meds 10. 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 T2DM: Monitor BS ac/hs. Po intake poor--used 2 units levemir daily prn BS > 150. Will use SSI for elevated BS until intake is consistent.  controlled   CBG (last 3)   Recent Labs  05/10/16 1634 05/10/16 2034 05/11/16 0638  GLUCAP 110* 131* 116*    12. HTN: Monitor bid.   Labile at times, monitor in accordance with HD per nephro, patient has terminated HD AMA at times 13. H/o Migraines: Managed with prn medications.  14. Anemia of chronic disease: Aranesp weekly with transfusion prn.   Hb 13.3 on 3/26  Improved- Aranesp per nephro 15. COPD: Respiratory status stable.  16. Leucocytosis:.   WBCs 12.5 3/26  afebrile 17.  PVD Right toes cool and dusky but no rest pain 18.  Nausea-also some diarrhea noted may be viral but has hx of both renal and hepatic failure LOS (Days) 11 A FACE TO FACE EVALUATION WAS PERFORMED  KIRSTEINS,ANDREW E 05/11/2016 8:27 AM

## 2016-05-11 NOTE — Progress Notes (Addendum)
Physical Therapy Session Note  Patient Details  Name: [name] MRN: [id number] Date of Birth: 10-Jul-1955  Today's Date: 05/11/2016 PT Individual Time: 0904-0920 PT Individual Time Calculation (min): 16 min   Short Term Goals: Week 2:  PT Short Term Goal 1 (Week 2): = LTGs due to anticipated LOS   Skilled Therapeutic Interventions/Progress Updates:  Pt received in bed reporting "I'm sick to my stomach" but reluctantly agreeable to bed level exercises but with minimal engagement. Pt performed the following LLE exercises: quad sets, hip abduction, and hip adduction pillow squeezes all with multimodal cuing for proper technique. Pt with c/o buttocks hurting - pt able to reposition with PT placing pillows underneath pt for pressure relief. Pt declined further participation in tx; pt missed 44 minutes of PT treatment 2/2 pain, fatigue, and unwillingness to participate.   Pt received in bed without KI donned. Prior to therapist's exit, PT donned L KI total assist.  Addendum: At end of session pt left in bed with alarm set & RN entering room.   Therapy Documentation Precautions:  Precautions Precautions: Fall Required Braces or Orthoses: Knee Immobilizer - Left Knee Immobilizer - Left: Other (comment) (on except in PT) Restrictions Weight Bearing Restrictions: Yes LLE Weight Bearing: Non weight bearing   General: PT Amount of Missed Time (min): 44 Minutes PT Missed Treatment Reason:  (pain, fatigue, pt unwilling to participate)  Pain: Stomach & BLE (phantom pain LLE, soreness RLE) - pain meds requested  See Function Navigator for Current Functional Status.   Therapy/Group: Individual Therapy  Waunita Schooner 05/11/2016, 9:31 AM

## 2016-05-11 NOTE — Progress Notes (Signed)
ANTICOAGULATION CONSULT NOTE - Follow Up Consult  Pharmacy Consult for coumadin Indication: AVR  Allergies  Allergen Reactions  . Acetaminophen Other (See Comments)    Liver transplant recipient   . Morphine Itching and Other (See Comments)    hallucinate  . Codeine Itching  . Mirtazapine Other (See Comments)    hallucination    Patient Measurements: Height: 5\' 4"  (162.6 cm) Weight: 110 lb 0.2 oz (49.9 kg) IBW/kg (Calculated) : 54.7 Heparin Dosing Weight:   Vital Signs: Temp: 98.5 F (36.9 C) (04/01 0458) Temp Source: Oral (04/01 0458) BP: 188/66 (04/01 0458) Pulse Rate: 116 (04/01 0458)  Labs:  Recent Labs  05/09/16 0618 05/09/16 1855 05/09/16 1856 05/10/16 0502 05/11/16 0545  HGB 13.8  --  12.5 13.4 13.5  HCT 40.6  --  36.2 39.5 40.9  PLT 310  --  329 312 267  LABPROT 33.9*  --   --  29.9* 33.6*  INR 3.25  --   --  2.77 3.22  CREATININE  --  5.69*  --   --   --     Estimated Creatinine Clearance: 8.2 mL/min (A) (by C-G formula based on SCr of 5.69 mg/dL (H)).   Medications:  Scheduled:  . aspirin EC  81 mg Oral Daily  . calcitRIOL  0.5 mcg Oral Q M,W,F-HD  . collagenase   Topical Daily  . darbepoetin (ARANESP) injection - DIALYSIS  25 mcg Intravenous Q Mon-HD  . feeding supplement  1 Container Oral TID BM  . feeding supplement (PRO-STAT SUGAR FREE 64)  30 mL Oral BID  . gabapentin  300 mg Oral QHS  . HYDROmorphone  2 mg Oral Once  . insulin aspart  0-5 Units Subcutaneous QHS  . insulin aspart  0-9 Units Subcutaneous TID WC  . levothyroxine  175 mcg Oral QAC breakfast  . methocarbamol  250 mg Oral TID  . metoprolol tartrate  25 mg Oral BID  . multivitamin  1 tablet Oral QHS  . pantoprazole  40 mg Oral Daily  . polyethylene glycol  17 g Oral Daily  . pregabalin  50 mg Oral Daily  . tacrolimus  2 mg Oral BID  . Warfarin - Pharmacist Dosing Inpatient   Does not apply q1800  . zinc sulfate  220 mg Oral Daily   Infusions:    Assessment: 61 yo  female with AVR is currently on therapeutic coumadin.  INR today is up to 3.22  Goal of Therapy:  INR 2.5-3.5 Monitor platelets by anticoagulation protocol: Yes   Plan:  - coumadin 1 mg po x1 - INR in am  Niobe Dick, Tsz-Yin 05/11/2016,11:12 AM

## 2016-05-12 ENCOUNTER — Inpatient Hospital Stay (HOSPITAL_COMMUNITY): Payer: Medicare Other | Admitting: Physical Therapy

## 2016-05-12 ENCOUNTER — Inpatient Hospital Stay (HOSPITAL_COMMUNITY): Payer: Medicare Other

## 2016-05-12 LAB — CBC
HEMATOCRIT: 36.7 % (ref 36.0–46.0)
HEMATOCRIT: 36.2 % (ref 36.0–46.0)
HEMOGLOBIN: 12.2 g/dL (ref 12.0–15.0)
HEMOGLOBIN: 12.1 g/dL (ref 12.0–15.0)
MCH: 29.2 pg (ref 26.0–34.0)
MCH: 29.6 pg (ref 26.0–34.0)
MCHC: 33.2 g/dL (ref 30.0–36.0)
MCHC: 33.4 g/dL (ref 30.0–36.0)
MCV: 87.8 fL (ref 78.0–100.0)
MCV: 88.5 fL (ref 78.0–100.0)
Platelets: 277 10*3/uL (ref 150–400)
Platelets: 263 10*3/uL (ref 150–400)
RBC: 4.18 MIL/uL (ref 3.87–5.11)
RBC: 4.09 MIL/uL (ref 3.87–5.11)
RDW: 17.6 % — AB (ref 11.5–15.5)
RDW: 17.3 % — AB (ref 11.5–15.5)
WBC: 14.4 10*3/uL — ABNORMAL HIGH (ref 4.0–10.5)
WBC: 13.5 10*3/uL — AB (ref 4.0–10.5)

## 2016-05-12 LAB — RENAL FUNCTION PANEL
ANION GAP: 12 (ref 5–15)
Albumin: 1.6 g/dL — ABNORMAL LOW (ref 3.5–5.0)
BUN: 38 mg/dL — AB (ref 6–20)
CHLORIDE: 97 mmol/L — AB (ref 101–111)
CO2: 25 mmol/L (ref 22–32)
Calcium: 8 mg/dL — ABNORMAL LOW (ref 8.9–10.3)
Creatinine, Ser: 7.29 mg/dL — ABNORMAL HIGH (ref 0.44–1.00)
GFR calc Af Amer: 6 mL/min — ABNORMAL LOW (ref >60)
GFR, EST NON AFRICAN AMERICAN: 5 mL/min — AB (ref >60)
Glucose, Bld: 118 mg/dL — ABNORMAL HIGH (ref 65–99)
POTASSIUM: 3.7 mmol/L (ref 3.5–5.1)
Phosphorus: 4 mg/dL (ref 2.5–4.6)
Sodium: 134 mmol/L — ABNORMAL LOW (ref 135–145)

## 2016-05-12 LAB — GLUCOSE, CAPILLARY
GLUCOSE-CAPILLARY: 113 mg/dL — AB (ref 65–99)
GLUCOSE-CAPILLARY: 59 mg/dL — AB (ref 65–99)
Glucose-Capillary: 127 mg/dL — ABNORMAL HIGH (ref 65–99)
Glucose-Capillary: 201 mg/dL — ABNORMAL HIGH (ref 65–99)

## 2016-05-12 LAB — PROTIME-INR
INR: 3.92
Prothrombin Time: 39.3 seconds — ABNORMAL HIGH (ref 11.4–15.2)

## 2016-05-12 MED ORDER — METHOCARBAMOL 500 MG PO TABS
250.0000 mg | ORAL_TABLET | Freq: Four times a day (QID) | ORAL | Status: DC | PRN
  Administered 2016-05-13 – 2016-05-19 (×11): 250 mg via ORAL
  Filled 2016-05-12 (×11): qty 1

## 2016-05-12 MED ORDER — HYDROMORPHONE HCL 2 MG PO TABS
ORAL_TABLET | ORAL | Status: AC
  Filled 2016-05-12: qty 1

## 2016-05-12 MED ORDER — DARBEPOETIN ALFA 25 MCG/0.42ML IJ SOSY
PREFILLED_SYRINGE | INTRAMUSCULAR | Status: AC
  Administered 2016-05-12: 25 ug via INTRAVENOUS
  Filled 2016-05-12: qty 0.42

## 2016-05-12 MED ORDER — METOCLOPRAMIDE HCL 5 MG PO TABS
5.0000 mg | ORAL_TABLET | Freq: Two times a day (BID) | ORAL | Status: DC
  Administered 2016-05-12 – 2016-05-20 (×15): 5 mg via ORAL
  Filled 2016-05-12 (×14): qty 1

## 2016-05-12 MED ORDER — WARFARIN SODIUM 1 MG PO TABS
1.0000 mg | ORAL_TABLET | Freq: Once | ORAL | Status: AC
  Administered 2016-05-12: 1 mg via ORAL
  Filled 2016-05-12: qty 1

## 2016-05-12 NOTE — Progress Notes (Signed)
ANTICOAGULATION CONSULT NOTE - Follow Up Consult  Pharmacy Consult for coumadin Indication: AVR  Allergies  Allergen Reactions  . Acetaminophen Other (See Comments)    Liver transplant recipient   . Morphine Itching and Other (See Comments)    hallucinate  . Codeine Itching  . Mirtazapine Other (See Comments)    hallucination    Patient Measurements: Height: 5\' 4"  (162.6 cm) Weight: 109 lb 5.6 oz (49.6 kg) IBW/kg (Calculated) : 54.7 Heparin Dosing Weight:   Vital Signs: Temp: 98 F (36.7 C) (04/02 0622) Temp Source: Oral (04/02 0622) BP: 164/70 (04/02 0622) Pulse Rate: 82 (04/02 0622)  Labs:  Recent Labs  05/09/16 1855  05/10/16 0502 05/11/16 0545 05/12/16 0508  HGB  --   < > 13.4 13.5 12.2  HCT  --   < > 39.5 40.9 36.7  PLT  --   < > 312 267 277  LABPROT  --   --  29.9* 33.6* 39.3*  INR  --   --  2.77 3.22 3.92  CREATININE 5.69*  --   --   --   --   < > = values in this interval not displayed.  Estimated Creatinine Clearance: 8.1 mL/min (A) (by C-G formula based on SCr of 5.69 mg/dL (H)).   Medications:  Scheduled:  . aspirin EC  81 mg Oral Daily  . calcitRIOL  0.5 mcg Oral Q M,W,F-HD  . collagenase   Topical Daily  . darbepoetin (ARANESP) injection - DIALYSIS  25 mcg Intravenous Q Mon-HD  . feeding supplement  1 Container Oral TID BM  . feeding supplement (PRO-STAT SUGAR FREE 64)  30 mL Oral BID  . gabapentin  300 mg Oral QHS  . HYDROmorphone  2 mg Oral Once  . insulin aspart  0-5 Units Subcutaneous QHS  . insulin aspart  0-9 Units Subcutaneous TID WC  . levothyroxine  175 mcg Oral QAC breakfast  . methocarbamol  250 mg Oral TID  . metoprolol tartrate  25 mg Oral BID  . multivitamin  1 tablet Oral QHS  . pantoprazole  40 mg Oral Daily  . polyethylene glycol  17 g Oral Daily  . pregabalin  50 mg Oral Daily  . tacrolimus  2 mg Oral BID  . Warfarin - Pharmacist Dosing Inpatient   Does not apply q1800  . zinc sulfate  220 mg Oral Daily   Infusions:     Assessment: 61 yo female with AVR is currently on supratherapeutic coumadin.  INR today is 3.92.  Patient was on 2 mg po daily prior to admission.  Goal of Therapy:  INR 2.5-3.5 Monitor platelets by anticoagulation protocol: Yes   Plan:  - coumadin 1 mg po x1 - INR in am  Silvia Hightower, Tsz-Yin 05/12/2016,8:16 AM

## 2016-05-12 NOTE — Progress Notes (Signed)
Occupational Therapy Session Note  Patient Details  Name: [name] MRN: [id number] Date of Birth: 1955-12-20  Today's Date: 05/12/2016 OT Individual Time: 0900-1004 OT Individual Time Calculation (min): 64 min    Short Term Goals: Week 2:  OT Short Term Goal 1 (Week 2): LTG=STG 2/2 estimated LOS  Skilled Therapeutic Interventions/Progress Updates:    Upon entering the room, pt seated in wheelchair awaiting therapist. Pt propelled wheelchair with B UE's and increased time 64' to day room with supervision. OT provided pt with paper handout and demonstrated exercises with use of theraband. Pt returned exercises with use of level 1 theraband and mod verbal and tactile cues for proper technique. Pt needing multiple rest breaks secondary to fatigue with exercise. Pt reports concerns over recovery and progress towards discharge. OT educated and discussed pt progress towards OT therapy goals and plan to continue in order to increase independence and safety with tasks. Pt propelled wheelchair back to room in same manner. Pt transferred from wheelchair > bed with min A and use of RW. Pt positioned onto R side for pressure relief with pillows utilized for proper positioning. All bed rails up and call bell within reach upon exiting the room.   Therapy Documentation Precautions:  Precautions Precautions: Fall Required Braces or Orthoses: Knee Immobilizer - Left Knee Immobilizer - Left: Other (comment) (on except in PT) Restrictions Weight Bearing Restrictions: Yes LLE Weight Bearing: Non weight bearing General:   Vital Signs:  Pain: Pain Assessment Pain Score: 2  Pain Type: Surgical pain Pain Location: Leg Pain Orientation: Left Pain Descriptors / Indicators: Aching;Discomfort Pain Intervention(s): Medication (See eMAR) ADL:   Exercises:   Other Treatments:    See Function Navigator for Current Functional Status.   Therapy/Group: Individual Therapy  [name] 05/12/2016,  11:09 AM

## 2016-05-12 NOTE — Progress Notes (Signed)
Physical Therapy Session Note  Patient Details  Name: [name] MRN: [id number] Date of Birth: 1955/09/18  Today's Date: 05/12/2016 PT Individual Time: 1400-1500 PT Individual Time Calculation (min): 60 min   Short Term Goals: Week 2:  PT Short Term Goal 1 (Week 2): = LTGs due to anticipated LOS  Skilled Therapeutic Interventions/Progress Updates: Pt received supine asleep in bed, c/o 7/10 pain in LLE residual limb and agreeable to treatment. Per RN pt not due for pain medication at this time. LLE residual limb re-wrapped with ace wrap. Supine>sit with S. Transfer stand pivot bed>w/c with min guard. W/c propulsion 2x75' with BUEs; second trial performed with w/c gloves to improve grip and efficiency. Gait with RW and min guard/minA x10' before fatigued. Stand pivot transfer w/c >nustep with min guard provided by therapist. Performed nustep x5 min with BUE, RLE on level 4, reduced to level 2 d/t fatigue. [name] daughter arrived at end of session; educated pt's daughter on w/c parts and setup/break down for transport. Daughter provided minA for stand pivot transfer nustep>w/c. Daughter reports concern with high bed height; requested that daughter measure bed height before tomorrow and bed in room can be elevated to that height to practice and daughter agreeable. Pt propelled back to room with S and family present at end of session.      Therapy Documentation Precautions:  Precautions Precautions: Fall Required Braces or Orthoses: Knee Immobilizer - Left Knee Immobilizer - Left: Other (comment) (on except in PT) Restrictions Weight Bearing Restrictions: Yes LLE Weight Bearing: Non weight bearing   See Function Navigator for Current Functional Status.   Therapy/Group: Individual Therapy  Luberta Mutter 05/12/2016, 3:44 PM

## 2016-05-12 NOTE — Progress Notes (Signed)
Occupational Therapy Note  Patient Details  Name: [name] MRN: [id number] Date of Birth: 01/02/1956  Today's Date: 05/12/2016 OT Missed Time: 5 Minutes Missed Time Reason: Patient fatigue  Pt missed 30 mins skilled OT services secondary to fatigue from earlier therapy sessions.    Leotis Shames [facility] 05/12/2016, 12:06 PM

## 2016-05-12 NOTE — Progress Notes (Signed)
Discussed issues with ongoing nausea with renal PA. Patient issues with that on acute and she recommended trial of reglan 5 mg bid ac. Will also change robaxin to qid prn to avoid sedation.

## 2016-05-12 NOTE — Progress Notes (Signed)
[facility] Progress Note   Dialysis Orders: [name] MWF 3h 47min 2/2 bath 58kg Hep none R IJ cath, calcitriol 0.5   Assessment/Plan: 1. 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 ESRD - MWF - for HD today 3. Anemia - hgb 12.2- stop ESA 4. Secondary hyperparathyroidism - Ca/P ok- binders held due to ^ corr Ca continuing calcitriol due to IPTH 5. HTN/volume - BP somewhat high net UF 2 L 3/30 - post bed wt 49.7- about 8 kg below prior EDW 6. Severe malnutrition - alb 1.7 - sig weight loss  7. Hx liver Tx - on prograf 8. Hx AVR on Coumadin -  9. Leukocytosis - improving 10. Sacral decub - stage 2 per RN - poor nutrition not helping  Myriam Jacobson, PA-C [facility] Beeper [phone] 05/12/2016,12:21 PM  LOS: 12 days    Pt seen, examined and agree w A/P as above.  [name] MD [name] pager [phone]   05/12/2016, 1:28 PM     Subjective:   No c/o- per RN was given dilaudid this am for pain and very drowsy ever since- ate "soup" for breakfast - nothing for lunch - too sleepy  Objective Vitals:   05/11/16 0458 05/11/16 1418 05/11/16 2133 05/12/16 0622  BP: (!) 188/66 (!) 177/62 (!) 179/68 (!) 164/70  Pulse: (!) 116 68 85 82  Resp: 20 19  18   Temp: 98.5 F (36.9 C) 98.2 F (36.8 C)  98 F (36.7 C)  TempSrc: Oral Oral  Oral  SpO2: 98% 100%  98%  Weight: 49.9 kg (110 lb 0.2 oz)   49.6 kg (109 lb 5.6 oz)  Height:       Physical Exam General: drowsy - rouses briefly Heart: RRR Lungs: no rales dim BS but very poor effort Abdomen: soft  Extremities: right BKA tender to touch left heel boggy Dialysis Access: right IJ   Additional Objective Labs: Basic Metabolic Panel:  Recent Labs Lab 05/05/16 1549 05/07/16 1435 05/09/16 1855  NA 133* 137 134*  K 3.6 3.2* 4.9  CL 97* 99* 96*  CO2 27 27 25   GLUCOSE 97 111* 110*  BUN 37* 35* 30*  CREATININE 6.13* 5.67* 5.69*  CALCIUM 8.7* 8.4* 8.5*  PHOS 3.8 5.0* 4.9*    Liver Function Tests:  Recent Labs Lab 05/05/16 1549 05/07/16 1435 05/09/16 1855  ALBUMIN 1.5* 1.5* 1.7*  CBC:  Recent Labs Lab 05/09/16 0618 05/09/16 1856 05/10/16 0502 05/11/16 0545 05/12/16 0508  WBC 18.9* 18.2* 16.1* 15.9* 14.4*  HGB 13.8 12.5 13.4 13.5 12.2  HCT 40.6 36.2 39.5 40.9 36.7  MCV 88.5 86.8 88.2 88.0 87.8  PLT 310 329 312 267 277   CBG:  Recent Labs Lab 05/11/16 1656 05/11/16 2042 05/11/16 2125 05/12/16 0635 05/12/16 1122  GLUCAP 137* 51* 91 127* 201*    Lab Results  Component Value Date   INR 3.92 05/12/2016   INR 3.22 05/11/2016   INR 2.77 05/10/2016   Studies/Results: No results found. Medications:  . aspirin EC  81 mg Oral Daily  . calcitRIOL  0.5 mcg Oral Q M,W,F-HD  . collagenase   Topical Daily  . darbepoetin (ARANESP) injection - DIALYSIS  25 mcg Intravenous Q Mon-HD  . feeding supplement  1 Container Oral TID BM  . feeding supplement (PRO-STAT SUGAR FREE 64)  30 mL Oral BID  . gabapentin  300 mg Oral QHS  . HYDROmorphone  2 mg Oral Once  . insulin aspart  0-5 Units Subcutaneous QHS  . insulin aspart  0-9 Units Subcutaneous TID WC  . levothyroxine  175 mcg Oral QAC breakfast  . methocarbamol  250 mg Oral TID  . metoprolol tartrate  25 mg Oral BID  . multivitamin  1 tablet Oral QHS  . pantoprazole  40 mg Oral Daily  . polyethylene glycol  17 g Oral Daily  . pregabalin  50 mg Oral Daily  . tacrolimus  2 mg Oral BID  . warfarin  1 mg Oral ONCE-1800  . Warfarin - Pharmacist Dosing Inpatient   Does not apply q1800  . zinc sulfate  220 mg Oral Daily

## 2016-05-12 NOTE — Significant Event (Signed)
Hypoglycemic Event  CBG: 51  Treatment: 15 GM carbohydrate snack  Symptoms: None  Follow-up CBG: Time: 2125 CBG Result:91  Possible Reasons for Event: Inadequate meal intake  Comments/MD notified: patient responded to treatment will notify in the morning ,patient stable and taking po's    Belva Chimes

## 2016-05-12 NOTE — Progress Notes (Signed)
Occupational Therapy Session Note  Patient Details  Name: [name] MRN: [id number] Date of Birth: 07/22/1955  Today's Date: 05/12/2016 OT Individual Time: 0700-0758 OT Individual Time Calculation (min): 58 min    Short Term Goals: Week 2:  OT Short Term Goal 1 (Week 2): LTG=STG 2/2 estimated LOS  Skilled Therapeutic Interventions/Progress Updates:    Pt resting in bed upon arrival.  Pt stated she felt much better compared to the weekend.  Pt also stated she had called for assistance to use the bathroom but was unable to wait until their assistance.  Pt incontinent of bowel.  Pt sat EOB with supervision and performed squat pivot transfer to w/c with steady A. Pt transferred to toilet to complete toileting tasks.  Pt returned to w/c and completed bathing/dressing tasks with sit<>stand from w/c at sink.  Pt requires steady A/min A for standing balance at sink.  Pt requires more that a reasonable amount of time to complete tasks and fatigues quickly.  Pt remained in w/c with all needs within reach.  Focus on activity tolerance, functional transfers, sit<>stand, standing balance, and safety awareness to increase independence with BADLs.   Therapy Documentation Precautions:  Precautions Precautions: Fall Required Braces or Orthoses: Knee Immobilizer - Left Knee Immobilizer - Left: Other (comment) (on except in PT) Restrictions Weight Bearing Restrictions: (P) Yes LLE Weight Bearing: Non weight bearing (s/p amputation ) Pain:  Pt denied pain  See Function Navigator for Current Functional Status.   Therapy/Group: Individual Therapy  Leroy Libman 05/12/2016, 8:02 AM

## 2016-05-13 ENCOUNTER — Inpatient Hospital Stay (HOSPITAL_COMMUNITY): Payer: Medicare Other | Admitting: Physical Therapy

## 2016-05-13 ENCOUNTER — Inpatient Hospital Stay (HOSPITAL_COMMUNITY): Payer: Medicare Other | Admitting: Occupational Therapy

## 2016-05-13 ENCOUNTER — Encounter (HOSPITAL_COMMUNITY): Payer: Medicare Other | Admitting: Occupational Therapy

## 2016-05-13 DIAGNOSIS — G546 Phantom limb syndrome with pain: Secondary | ICD-10-CM

## 2016-05-13 LAB — PROTIME-INR
INR: 3.29
Prothrombin Time: 34.2 seconds — ABNORMAL HIGH (ref 11.4–15.2)

## 2016-05-13 LAB — CBC
HCT: 35.1 % — ABNORMAL LOW (ref 36.0–46.0)
Hemoglobin: 12 g/dL (ref 12.0–15.0)
MCH: 30 pg (ref 26.0–34.0)
MCHC: 34.2 g/dL (ref 30.0–36.0)
MCV: 87.8 fL (ref 78.0–100.0)
PLATELETS: 196 10*3/uL (ref 150–400)
RBC: 4 MIL/uL (ref 3.87–5.11)
RDW: 17.5 % — AB (ref 11.5–15.5)
WBC: 10.5 10*3/uL (ref 4.0–10.5)

## 2016-05-13 LAB — GLUCOSE, CAPILLARY
GLUCOSE-CAPILLARY: 114 mg/dL — AB (ref 65–99)
Glucose-Capillary: 118 mg/dL — ABNORMAL HIGH (ref 65–99)
Glucose-Capillary: 202 mg/dL — ABNORMAL HIGH (ref 65–99)
Glucose-Capillary: 94 mg/dL (ref 65–99)

## 2016-05-13 MED ORDER — PREGABALIN 75 MG PO CAPS
75.0000 mg | ORAL_CAPSULE | Freq: Every day | ORAL | Status: DC
  Administered 2016-05-13 – 2016-05-20 (×8): 75 mg via ORAL
  Filled 2016-05-13 (×8): qty 1

## 2016-05-13 MED ORDER — WARFARIN SODIUM 1 MG PO TABS
1.5000 mg | ORAL_TABLET | Freq: Once | ORAL | Status: AC
  Administered 2016-05-13: 17:00:00 1.5 mg via ORAL
  Filled 2016-05-13: qty 1

## 2016-05-13 NOTE — Progress Notes (Addendum)
Social Work Patient ID: [name], female   DOB: 1956/01/17, 61 y.o.   MRN: [id number]   Both daughter's here to go through family education with PT & OT. Gentleman who was going to build pt's ramp has Fractured his hip and is not able to build it. Getting a replacement [person to build pt's ramp. All aware pt's discharge has been moved to 4/5, if medically stable. Family to provide 24 hr supervision for a short time Due to eldest daughter on a leave form work. Equipment delivered and home health arranged for discharge Thursday. Pt looks better with daughter's here. Daughter's taught to bump pt up the stairs until ramp is built both did well.

## 2016-05-13 NOTE — Progress Notes (Signed)
Social Work Patient ID: [name], female   DOB: [birth year], 61 y.o.   MRN: [id number]   CSW spoke with pt's therapists 05-12-16 who are concerned that pt will have not met goals by 05-13-16 due to being nauseated over the weekend and missing therapy.  CSW spoke with Dr. Letta Pate who approved change in d/c date to 05-15-16.  CSW updated pt and spoke with her dtrs via telephone in pt's room to inform them.  They were very appreciative of the change and will come for family education from 2pm-4pm on 05-13-16.  CSW will continue to follow and assist as needed.

## 2016-05-13 NOTE — Progress Notes (Signed)
   VASCULAR SURGERY ASSESSMENT & PLAN:   POD 19 s/p Left AKA  Staples out in 11 days.    SUBJECTIVE:   No complaints  PHYSICAL EXAM:   Vitals:   05/12/16 2202 05/12/16 2229 05/13/16 0500 05/13/16 1403  BP: 138/66 138/66 (!) 136/56 (!) 103/42  Pulse: 81 81 77 (!) 116  Resp:   18 18  Temp:  98.5 F (36.9 C) 99.1 F (37.3 C) 98.2 F (36.8 C)  TempSrc:  Oral Oral Oral  SpO2:  100% 96% 90%  Weight:   110 lb (49.9 kg)   Height:       Left AKA inspected and is healing nicely  LABS:   Lab Results  Component Value Date   WBC 10.5 05/13/2016   HGB 12.0 05/13/2016   HCT 35.1 (L) 05/13/2016   MCV 87.8 05/13/2016   PLT 196 05/13/2016   Lab Results  Component Value Date   CREATININE 7.29 (H) 05/12/2016   Lab Results  Component Value Date   INR 3.29 05/13/2016   CBG (last 3)   Recent Labs  05/12/16 2335 05/13/16 0628 05/13/16 1202  GLUCAP 113* 114* 118*    PROBLEM LIST:    Active Problems:   S/P unilateral BKA (below knee amputation), left (HCC)   Unilateral complete BKA, left, initial encounter (HCC)   Chronic midline low back pain without sciatica   ESRD on dialysis ([place])   Type 2 diabetes mellitus with complication, with long-term current use of insulin (HCC)   Supratherapeutic INR   Anemia of chronic disease   CURRENT MEDS:   . aspirin EC  81 mg Oral Daily  . calcitRIOL  0.5 mcg Oral Q M,W,F-HD  . collagenase   Topical Daily  . feeding supplement  1 Container Oral TID BM  . feeding supplement (PRO-STAT SUGAR FREE 64)  30 mL Oral BID  . HYDROmorphone  2 mg Oral Once  . insulin aspart  0-5 Units Subcutaneous QHS  . insulin aspart  0-9 Units Subcutaneous TID WC  . levothyroxine  175 mcg Oral QAC breakfast  . metoCLOPramide  5 mg Oral BID AC  . metoprolol tartrate  25 mg Oral BID  . multivitamin  1 tablet Oral QHS  . pantoprazole  40 mg Oral Daily  . polyethylene glycol  17 g Oral Daily  . pregabalin  75 mg Oral Daily  . tacrolimus  2 mg Oral  BID  . warfarin  1.5 mg Oral ONCE-1800  . Warfarin - Pharmacist Dosing Inpatient   Does not apply q1800  . zinc sulfate  220 mg Oral Daily    Gae Gallop Beeper: [phone] Office: [phone] 05/13/2016

## 2016-05-13 NOTE — Progress Notes (Signed)
ANTICOAGULATION CONSULT NOTE - Follow Up Consult  Pharmacy Consult for coumadin Indication: AVR  Allergies  Allergen Reactions  . Acetaminophen Other (See Comments)    Liver transplant recipient   . Morphine Itching and Other (See Comments)    hallucinate  . Codeine Itching  . Mirtazapine Other (See Comments)    hallucination    Patient Measurements: Height: 5\' 4"  (162.6 cm) Weight: 110 lb (49.9 kg) IBW/kg (Calculated) : 54.7 Heparin Dosing Weight:   Vital Signs: Temp: 99.1 F (37.3 C) (04/03 0500) Temp Source: Oral (04/03 0500) BP: 136/56 (04/03 0500) Pulse Rate: 77 (04/03 0500)  Labs:  Recent Labs  05/11/16 0545 05/12/16 0508 05/12/16 1801 05/12/16 1813 05/13/16 0641  HGB 13.5 12.2  --  12.1 12.0  HCT 40.9 36.7  --  36.2 35.1*  PLT 267 277  --  263 PENDING  LABPROT 33.6* 39.3*  --   --  34.2*  INR 3.22 3.92  --   --  3.29  CREATININE  --   --  7.29*  --   --     Estimated Creatinine Clearance: 6.4 mL/min (A) (by C-G formula based on SCr of 7.29 mg/dL (H)).   Medications:  Scheduled:  . aspirin EC  81 mg Oral Daily  . calcitRIOL  0.5 mcg Oral Q M,W,F-HD  . collagenase   Topical Daily  . feeding supplement  1 Container Oral TID BM  . feeding supplement (PRO-STAT SUGAR FREE 64)  30 mL Oral BID  . HYDROmorphone  2 mg Oral Once  . insulin aspart  0-5 Units Subcutaneous QHS  . insulin aspart  0-9 Units Subcutaneous TID WC  . levothyroxine  175 mcg Oral QAC breakfast  . metoCLOPramide  5 mg Oral BID AC  . metoprolol tartrate  25 mg Oral BID  . multivitamin  1 tablet Oral QHS  . pantoprazole  40 mg Oral Daily  . polyethylene glycol  17 g Oral Daily  . pregabalin  75 mg Oral Daily  . tacrolimus  2 mg Oral BID  . Warfarin - Pharmacist Dosing Inpatient   Does not apply q1800  . zinc sulfate  220 mg Oral Daily   Infusions:    Assessment: 61 yo female with AVR is currently on therapeutic coumadin.  INR is down to 3.29.  Goal of Therapy:  INR  2.5-3.5 Monitor platelets by anticoagulation protocol: Yes   Plan:  - coumadin 1.5 mg po x1 - INR in am  Jhayla Podgorski, Tsz-Yin 05/13/2016,8:16 AM

## 2016-05-13 NOTE — Progress Notes (Signed)
[place] [facility]     PROGRESS NOTE  Subjective/Complaints: Pt with ongoing nausea as per renal PA, trial of Reglan     ROS: pt denies nausea,  diarrhea, cough, shortness of breath or chest pain    Objective: Vital Signs: Blood pressure (!) 136/56, pulse 77, temperature 99.1 F (37.3 C), temperature source Oral, resp. rate 18, height 5\' 4"  (1.626 m), weight 49.9 kg (110 lb), SpO2 96 %. No results found.  Recent Labs  05/12/16 0508 05/12/16 1813  WBC 14.4* 13.5*  HGB 12.2 12.1  HCT 36.7 36.2  PLT 277 263    Recent Labs  05/12/16 1801  NA 134*  K 3.7  CL 97*  GLUCOSE 118*  BUN 38*  CREATININE 7.29*  CALCIUM 8.0*   CBG (last 3)   Recent Labs  05/12/16 2230 05/12/16 2335 05/13/16 0628  GLUCAP 59* 113* 114*    Wt Readings from Last 3 Encounters:  05/13/16 49.9 kg (110 lb)  04/30/16 54.7 kg (120 lb 9.5 oz)  04/17/16 58.7 kg (129 lb 6.4 oz)    Physical Exam:  BP (!) 136/56 (BP Location: Left Arm)   Pulse 77   Temp 99.1 F (37.3 C) (Oral)   Resp 18   Ht 5\' 4"  (1.626 m)   Wt 49.9 kg (110 lb)   SpO2 96%   BMI 18.88 kg/m  Constitutional: She appears well-developed and well-nourished. NAD. HENT: Normocephalic and atraumatic. Edentulous   Eyes: EOMI. No discharge.  Cardiovascular: RRR Respiratory: normal effort GI: Soft. Bowel sounds are normal.  Musculoskeletal: She exhibits edema and tenderness.  Neurological: She is alert and oriented.  Motor: B/l UE 4/5 RLE: HF 2+/5, KE 3/5, ADF/PF 4/5 (improving) LLE: HF 4/5  Skin: Skin is warm and dry. Dusky extremities unchanged Right toes dusky,with boggy heel.   Stump with staples dried blood medial aspect of incision no active bleeding or drainage  Psychiatric: flat.    Assessment/Plan: 1. Functional deficits secondary to left BKA which require 3+ hours per day of interdisciplinary therapy in a comprehensive inpatient rehab setting. Physiatrist is providing close team  supervision and 24 hour management of active medical problems listed below. Physiatrist and rehab team continue to assess barriers to discharge/monitor patient progress toward functional and medical goals.  Function:  Bathing Bathing position   Position: Wheelchair/chair at sink  Bathing parts Body parts bathed by patient: Right arm, Left arm, Chest, Abdomen, Buttocks, Right upper leg, Left upper leg, Right lower leg, Front perineal area Body parts bathed by helper: Back  Bathing assist Assist Level: Touching or steadying assistance(Pt > 75%)      Upper Body Dressing/Undressing Upper body dressing   What is the patient wearing?: Pull over shirt/dress     Pull over shirt/dress - Perfomed by patient: Thread/unthread right sleeve, Thread/unthread left sleeve, Put head through opening          Upper body assist Assist Level: More than reasonable time   Set up : To obtain clothing/put away  Lower Body Dressing/Undressing Lower body dressing   What is the patient wearing?: Underwear, Pants Underwear - Performed by patient: Thread/unthread right underwear leg, Thread/unthread left underwear leg, Pull underwear up/down Underwear - Performed by helper: Pull underwear up/down Pants- Performed by patient: Thread/unthread right pants leg, Thread/unthread left pants leg, Pull pants up/down Pants- Performed by helper: Thread/unthread right pants leg, Thread/unthread left pants leg, Pull pants up/down Non-skid slipper socks- Performed by patient: Don/doff right sock Non-skid slipper socks-  Performed by helper: Don/doff right sock                  Lower body assist Assist for lower body dressing: Touching or steadying assistance (Pt > 75%)      Toileting Toileting Toileting activity did not occur:  (does not void, no bm today) Toileting steps completed by patient: Adjust clothing prior to toileting, Performs perineal hygiene, Adjust clothing after toileting Toileting steps completed  by helper: Adjust clothing after toileting Toileting Assistive Devices: Grab bar or rail  Toileting assist Assist level: Touching or steadying assistance (Pt.75%)   Transfers Chair/bed transfer   Chair/bed transfer method: Stand pivot Chair/bed transfer assist level: Touching or steadying assistance (Pt > 75%) Chair/bed transfer assistive device: Armrests     Locomotion Ambulation     Max distance: 10 Assist level: Touching or steadying assistance (Pt > 75%)   Wheelchair   Type: Manual Max wheelchair distance: 75 Assist Level: Supervision or verbal cues  Cognition Comprehension Comprehension assist level: Follows basic conversation/direction with no assist  Expression Expression assist level: Expresses basic needs/ideas: With no assist  Social Interaction Social Interaction assist level: Interacts appropriately 90% of the time - Needs monitoring or encouragement for participation or interaction.  Problem Solving Problem solving assist level: Solves basic 50 - 74% of the time/requires cueing 25 - 49% of the time  Memory Memory assist level: Recognizes or recalls 75 - 89% of the time/requires cueing 10 - 24% of the time    Medical Problem List and Plan: 1.  Gait abnormality, difficulty with transfers, limitation with self-care secondary to left BKA.  Cont CIR PT, OT- team conf  2.  DVT Prophylaxis/Anticoagulation: Pharmaceutical:  Coumadin --adjustment per pharmacy 3. Pain Management:L phantom limb pain D/C Gabapentin, increase  Lyrica to 75mg . Used dilaudid at home for CLBP. Dilaudid  Monitor with increased activity  Lidocaine ointment for pressure sores  -suspect chronic pain/behavioral component. Neuropsych recommended  -added robaxin for muscle spasms.  4. Mood: Improving with decrease in pain. LCSW to follow for evaluation and support.  5. Neuropsych: This patient is capable of making decisions on her own behalf. 6. Skin/Wound Care: Routine wound care. Stump  healing-Appreciate VVS note  -pressure sore RIght heel- PRAFO 7. Fluids/Electrolytes/Nutrition: Strict I/O. Serial labs with HD to monitor lytes 8. ESRD: Monitor weight daily with strict I/O. Schedule HD late afternoons to help with tolerance of activity during the day.  9. CAD s/p CABG with AVR: Cont meds 10. 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 T2DM: Monitor BS ac/hs. Po intake poor--used 2 units levemir daily prn BS > 150. Will use SSI for elevated BS until intake is consistent.  controlled   CBG (last 3)   Recent Labs  05/12/16 2230 05/12/16 2335 05/13/16 0628  GLUCAP 59* 113* 114*    12. HTN: Monitor bid.   Labile at times, monitor in accordance with HD per nephro, patient has terminated HD AMA at times 13. H/o Migraines: Managed with prn medications.  14. Anemia of chronic disease: Aranesp weekly with transfusion prn.   Hb 13.3 on 3/26  Improved- Aranesp per nephro 15. COPD: Respiratory status stable.  16. Leucocytosis:.   WBCs 12.5 3/26  afebrile 17.  PVD Right toes cool and dusky but no rest pain 18.  Nausea Improved- renal and hepatic failure, no diarrhea, trial reglan LOS (Days) 13 A FACE TO FACE EVALUATION WAS PERFORMED  Hilman Kissling E 05/13/2016 7:09 AM

## 2016-05-13 NOTE — Progress Notes (Signed)
Physical Therapy Session Note  Patient Details  Name: [name] MRN: [id number] Date of Birth: 22-Apr-1955  Today's Date: 05/13/2016 PT Individual Time: 1500-1610 PT Individual Time Calculation (min): 70 min   Short Term Goals: Week 2:  PT Short Term Goal 1 (Week 2): = LTGs due to anticipated LOS  Skilled Therapeutic Interventions/Progress Updates:   Session focused on hands-on family training with patient and patient's 2 daughters and sister present to observe. Focus on simulated car transfer to SUV height via stand pivot using RW, bed <> wheelchair transfers via squat pivot, bed mobility, and wheelchair <> low compliant couch surface via squat pivot x 2. Patient required supervision-min A for all transfers and daughters provided safe and appropriate cues and assistance for wheelchair parts management and setup. Daughters instructed in wheelchair bumping up/down 4 stairs to simulate home entry as ramp not completed as expected. Each daughter practiced being the person in front/back for wheelchair bumping and provided handout. Patient declined ambulation at this time due to R foot pain. Patient propelled wheelchair throughout rehab unit with supervision and daughters demonstrated folding/unfolding WC to place in car. Reinforced need to keep RLE in extension when in bed or sitting in chair, all verbalized understanding. Patient required increased assistance with wheelchair setup and parts management compared to AM session-encouraged pt and family to allow pt to attempt first before assisting to increase functional recall and improve independence. Discussed frequency and technique for pressure relief with patient demonstrating in wheelchair via lateral and forward leans. Patient and family with no further questions or concerns at this time. Patient left sitting in wheelchair with needs in reach and family present.   Therapy Documentation Precautions:  Precautions Precautions: Fall Required Braces  or Orthoses: Knee Immobilizer - Left Knee Immobilizer - Left: Other (comment) (on except in PT) Restrictions Weight Bearing Restrictions: Yes LLE Weight Bearing: Non weight bearing Pain:  6/10 R foot pain, RN aware, provided rest   See Function Navigator for Current Functional Status.   Therapy/Group: Individual Therapy  Astella Desir, Murray Hodgkins 05/13/2016, 4:21 PM

## 2016-05-13 NOTE — Progress Notes (Signed)
Nutrition Follow-up  DOCUMENTATION CODES:   Non-severe (moderate) malnutrition in context of chronic illness  INTERVENTION:  Continue Boost Breeze po TID, each supplement provides 250 kcal and 9 grams of protein.  Continue 30 ml Prostat po BID, each supplement provides 100 kcal and 15 grams of protein.   Encourage adequate PO intake.   NUTRITION DIAGNOSIS:   Malnutrition related to chronic illness as evidenced by moderate depletion of body fat, moderate depletions of muscle mass; ongoing  GOAL:   Patient will meet greater than or equal to 90% of their needs; progressing  MONITOR:   PO intake, Supplement acceptance, Labs, Weight trends, Skin, I & O's  REASON FOR ASSESSMENT:   Malnutrition Screening Tool    ASSESSMENT:   61 y.o. female with history of ESRD--HD MWF, Hep C s/p liver transplant, CAD s/p CABG with AVR, chronic pain, PAD with chronic heel ulcer and ischemic toes. She has had progressive pain  and was in process of being set for BKA. She was admitted on 04/21/16 with excruciating pain and inability to stand. She underwent L-BKA by Dr. Scot Dock on 04/24/16.   Meal completion has been mostly 10-50%. Pt reports appetite and nausea has been improving. Noted reglan has been ordered. Pt currently has Boost Breeze and Prostat ordered and has been consuming them. RD to continue with current orders.   Diet Order:  Diet regular Room service appropriate? Yes; Fluid consistency: Thin; Fluid restriction: 1200 mL Fluid  Skin:  Wound (see comment) (Stg 2 coccyx, stage I L &R  hip, incision on L leg)  Last BM:  4/2  Height:   Ht Readings from Last 1 Encounters:  04/30/16 5\' 4"  (1.626 m)    Weight:   Wt Readings from Last 1 Encounters:  05/13/16 110 lb (49.9 kg)    Ideal Body Weight:  51.3 kg (adjusted for L BKA)  BMI:  Body mass index is 18.88 kg/m.  Estimated Nutritional Needs:   Kcal:  1700-1900  Protein:  75-90 grams  Fluid:  1.2 L/day  EDUCATION NEEDS:    No education needs identified at this time  Corrin Parker, MS, RD, LDN Pager # [phone] After hours/ weekend pager # [phone]

## 2016-05-13 NOTE — Progress Notes (Signed)
Physical Therapy Session Note  Patient Details  Name: [name] MRN: [id number] Date of Birth: 05-07-1955  Today's Date: 05/13/2016 PT Individual Time: 1130-1202 PT Individual Time Calculation (min): 32 min MAKEUP SESSION  Short Term Goals: Week 2:  PT Short Term Goal 1 (Week 2): = LTGs due to anticipated LOS  Skilled Therapeutic Interventions/Progress Updates:   Patient seen for makeup session sitting in wheelchair upon arrival, reporting feeling better. Patient propelled wheelchair to and from gym using BUE with supervision and able to manage wheelchair parts with setup assist. Patient set up wheelchair in preparation for transfer to mat table with verbal cues to remove leg rests and arm rests before transfer. Patient completed squat pivot transfer wheelchair <> mat table with supervision. Bed mobility on flat mat table with supervision. Supine BLE therex for strengthening and ROM: SAQ over bolster x 20 each LE,  LLE SLR 2 x 20, bridging over bolster x 20,  L sidelying hip flexion/extension x 20, and L sidelying hip abduction x 20. Patient left sitting in wheelchair with nurse tech present and notified of patient request for dressing for R heel due to pain.    Therapy Documentation Precautions:  Precautions Precautions: Fall Required Braces or Orthoses: Knee Immobilizer - Left Knee Immobilizer - Left: Other (comment) (on except in PT) Restrictions Weight Bearing Restrictions: Yes LLE Weight Bearing: Non weight bearing Pain: Pain Assessment Pain Assessment: 0-10 Pain Score: 6  Pain Type: Acute pain Pain Location: Foot Pain Orientation: Right Pain Descriptors / Indicators: Throbbing Pain Onset: On-going Pain Intervention(s): Repositioned;Rest   See Function Navigator for Current Functional Status.   Therapy/Group: Individual Therapy  Jinnifer Montejano, Murray Hodgkins 05/13/2016, 12:09 PM

## 2016-05-13 NOTE — Progress Notes (Signed)
Occupational Therapy Session Note  Patient Details  Name: [name] MRN: [id number] Date of Birth: Dec 21, 1955  Today's Date: 05/13/2016  Session 1 OT Individual Time: 1005-1100 OT Individual Time Calculation (min): 55 min   Session 2 OT Individual Time: 5621-3086 OT Individual Time Calculation (min): 56 min    Short Term Goals: Week 2:  OT Short Term Goal 1 (Week 2): LTG=STG 2/2 estimated LOS  Skilled Therapeutic Interventions/Progress Updates:  Session 1   OT treatment session focused on modified bathing/dressing, improved sit<>stand, standing balance, and w/c management. Pt collected clothing from wc level, then propelled wc to the sink for ADL.  Bathing/dressing completed with overall supervision and increased time. S Close supervision sit<>stand with VC for balance strategies. Pt demonstrated good safety awareness and good recall of modified strategies from previous OT sessions. Demonstrated w/c functions of leg rest and amputee support pad-pt demonstrated understanding. Pt left seated in wc at end of session with needs met.   Session 2 OT treatment session focused on pt/family education, home modifications, and transfer training. [name] two daughters and her sister present for family ed.  OT educated on wc positioning for toilet transfers in simulated home environment- demonstrated supervision assist and proper caregiver position for toilet and tub bench transfers. Pt demonstrated dynamic standing activities at the sink using hip support balance strategy while family members provided close supervision and intermittent min guard A.  Discussed home modifications for safe ADL participation. Pt/family report feeling confident regarding dc home Thursday. Pt left in care of family in therapy gym for PT handoff.  Therapy Documentation Precautions:  Precautions Precautions: Fall Required Braces or Orthoses: Knee Immobilizer - Left Knee Immobilizer - Left: Other (comment) (on except in  PT) Restrictions Weight Bearing Restrictions: Yes LLE Weight Bearing: Non weight bearing Pain: Pain Assessment Pain Assessment: 0-10 Pain Score: 7  Pain Type: Acute pain Pain Location: Foot Pain Orientation: Right Pain Descriptors / Indicators: Aching Pain Onset: On-going Patients Stated Pain Goal: 2 Pain Intervention(s): Repositioned  See Function Navigator for Current Functional Status.   Therapy/Group: Individual Therapy  [name] 05/13/2016, 8:19 PM

## 2016-05-13 NOTE — Plan of Care (Signed)
Problem: RH Simple Meal Prep Goal: LTG Patient will perform simple meal prep w/assist (OT) LTG: Patient will perform simple meal prep with assistance, with/without cues (OT).  Outcome: Completed/Met Date Met: 05/13/16 Met- ESD  Problem: RH Light Housekeeping Goal: LTG Patient will perform light housekeeping w/assist (OT) LTG: Patient will perform light housekeeping with assistance, with/without cues (OT).  Outcome: Completed/Met Date Met: 05/13/16 Met-ESD

## 2016-05-14 ENCOUNTER — Inpatient Hospital Stay (HOSPITAL_COMMUNITY): Payer: Medicare Other | Admitting: Physical Therapy

## 2016-05-14 ENCOUNTER — Inpatient Hospital Stay (HOSPITAL_COMMUNITY): Payer: Medicare Other | Admitting: Occupational Therapy

## 2016-05-14 LAB — CBC
HEMATOCRIT: 36.7 % (ref 36.0–46.0)
Hemoglobin: 12 g/dL (ref 12.0–15.0)
MCH: 28.8 pg (ref 26.0–34.0)
MCHC: 32.7 g/dL (ref 30.0–36.0)
MCV: 88 fL (ref 78.0–100.0)
Platelets: 219 10*3/uL (ref 150–400)
RBC: 4.17 MIL/uL (ref 3.87–5.11)
RDW: 17.5 % — AB (ref 11.5–15.5)
WBC: 11.1 10*3/uL — ABNORMAL HIGH (ref 4.0–10.5)

## 2016-05-14 LAB — GLUCOSE, CAPILLARY
Glucose-Capillary: 117 mg/dL — ABNORMAL HIGH (ref 65–99)
Glucose-Capillary: 193 mg/dL — ABNORMAL HIGH (ref 65–99)
Glucose-Capillary: 151 mg/dL — ABNORMAL HIGH (ref 65–99)

## 2016-05-14 LAB — PROTIME-INR
INR: 2.51
Prothrombin Time: 27.6 seconds — ABNORMAL HIGH (ref 11.4–15.2)

## 2016-05-14 MED ORDER — HEPARIN SODIUM (PORCINE) 1000 UNIT/ML DIALYSIS: 1000.0000 [IU] | INTRAMUSCULAR | Status: DC | PRN

## 2016-05-14 MED ORDER — SODIUM CHLORIDE 0.9 % IV SOLN: 100.0000 mL | INTRAVENOUS | Status: DC | PRN

## 2016-05-14 MED ORDER — LIDOCAINE-PRILOCAINE 2.5-2.5 % EX CREA: 1.0000 "application " | TOPICAL_CREAM | CUTANEOUS | Status: DC | PRN

## 2016-05-14 MED ORDER — LIDOCAINE HCL (PF) 1 % IJ SOLN: 5.0000 mL | INTRAMUSCULAR | Status: DC | PRN

## 2016-05-14 MED ORDER — PENTAFLUOROPROP-TETRAFLUOROETH EX AERO: 1.0000 "application " | INHALATION_SPRAY | CUTANEOUS | Status: DC | PRN

## 2016-05-14 MED ORDER — WARFARIN SODIUM 1 MG PO TABS
1.5000 mg | ORAL_TABLET | Freq: Once | ORAL | Status: AC
  Administered 2016-05-14: 1.5 mg via ORAL
  Filled 2016-05-14: qty 1

## 2016-05-14 MED ORDER — ALTEPLASE 2 MG IJ SOLR: 2.0000 mg | Freq: Once | INTRAMUSCULAR | Status: DC | PRN

## 2016-05-14 NOTE — Progress Notes (Signed)
Social Work Patient ID: [name], female   DOB: [birth year], 61 y.o.   MRN: [id number] Met with pt to inform team conference she feels she had her best day today and is ready to go home tomorrow. She feels much better and her daughter's will be there with her 24 hr for a short time. Has equipment and follow up arranged via [facility]. Ready for discharge tomorrow.

## 2016-05-14 NOTE — Progress Notes (Signed)
Occupational Therapy Session Note  Patient Details  Name: [name] MRN: [id number] Date of Birth: [birth year]  Today's Date: 05/14/2016 OT Individual Time: 1100-1200 OT Individual Time Calculation (min): 60 min    Short Term Goals: Week 1:  OT Short Term Goal 1 (Week 1): Pt will complete all bathing sit to stand with supervision.  OT Short Term Goal 1 - Progress (Week 1): Progressing toward goal OT Short Term Goal 2 (Week 1): Pt will completed toilet transfers and toileting with supervision sit to stand.  OT Short Term Goal 2 - Progress (Week 1): Progressing toward goal OT Short Term Goal 3 (Week 1): Pt will complete LB dressing sit to stand with supervision.  OT Short Term Goal 3 - Progress (Week 1): Progressing toward goal OT Short Term Goal 4 (Week 1): Pt will perform standing at the sink for 2 mins with no more than supervision during completion of grooming tasks.   OT Short Term Goal 4 - Progress (Week 1): Progressing toward goal Week 2:  OT Short Term Goal 1 (Week 2): LTG=STG 2/2 estimated LOS     Skilled Therapeutic Interventions/Progress Updates:    Pt seen for ADL retraining of toileting (simulated as pt did not need to have a BM), bathing and dressing from w/c level at sink.  Pt is able to get herself set up and retrieve clothing from her drawers independently.  She needs close S for safety with sit to stand at sink and standing during clothing management.   Daughter arrived and therapist and daughter set up her room to simulate exact set up of her bathroom at home.  Pt practiced a squat pivot w/c >< BSC (as if it was set over toilet) with close S. Return demonstration with pt practicing transfer with daughter with daughter only providing S.  Pt in good spirits today, stated she is feeling well and ready to return home tomorrow.   Therapy Documentation Precautions:  Precautions Precautions: Fall Required Braces or Orthoses: Knee Immobilizer - Left Knee Immobilizer - Left:  Other (comment) Restrictions Weight Bearing Restrictions: Yes LLE Weight Bearing: Non weight bearing   Pain: Pain Assessment Pain Assessment: 0-10 Pain Score: 6  Pain Type: Acute pain Pain Location: Leg Pain Orientation: Left Pain Descriptors / Indicators: Aching Pain Onset: On-going Pain Intervention(s): Repositioned;Rest ADL:   See Function Navigator for Current Functional Status.   Therapy/Group: Individual Therapy  [place] 05/14/2016, 12:11 PM

## 2016-05-14 NOTE — Progress Notes (Signed)
[facility] Progress Note   Dialysis Orders: [name] MWF 3h 21min 2/2 bath 58kg Hep none R IJ cath, calcitriol 0.5   Assessment/Plan: 1. 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 ESRD - MWF - for HD today 3. Anemia - hgb 12.2- stop ESA 4. Secondary hyperparathyroidism - Ca/P ok- binders held due to ^ corr Ca continuing calcitriol due to IPTH 5. HTN/volume - 11kg under prior dry wt.  NO vol excess on exam 6. Severe malnutrition - alb 1.7 - sig weight loss  7. Hx liver Tx - on prograf 8. Hx AVR on Coumadin -  9. Leukocytosis - improving 10. Sacral decub - stage 2 per RN - poor nutrition not helping   [name] MD [facility] pager [phone]   05/14/2016, 1:11 PM     Subjective:   Up working out in the gym  Objective Vitals:   05/13/16 0500 05/13/16 1403 05/14/16 0519 05/14/16 0527  BP: (!) 136/56 (!) 103/42 127/60   Pulse: 77 (!) 116 80   Resp: 18 18 18    Temp: 99.1 F (37.3 C) 98.2 F (36.8 C) 98 F (36.7 C)   TempSrc: Oral Oral Oral   SpO2: 96% 90% 95%   Weight: 49.9 kg (110 lb)   46.7 kg (103 lb)  Height:       Physical Exam General: alert and no distress Heart: RRR Lungs: no rales dim BS but very poor effort Abdomen: soft  Extremities: right BKA tender to touch left heel boggy Dialysis Access: right IJ   Additional Objective Labs: Basic Metabolic Panel:  Recent Labs Lab 05/07/16 1435 05/09/16 1855 05/12/16 1801  NA 137 134* 134*  K 3.2* 4.9 3.7  CL 99* 96* 97*  CO2 27 25 25   GLUCOSE 111* 110* 118*  BUN 35* 30* 38*  CREATININE 5.67* 5.69* 7.29*  CALCIUM 8.4* 8.5* 8.0*  PHOS 5.0* 4.9* 4.0   Liver Function Tests:  Recent Labs Lab 05/07/16 1435 05/09/16 1855 05/12/16 1801  ALBUMIN 1.5* 1.7* 1.6*  CBC:  Recent Labs Lab 05/11/16 0545 05/12/16 0508 05/12/16 1813 05/13/16 0641 05/14/16 0649  WBC 15.9* 14.4* 13.5* 10.5 11.1*  HGB 13.5 12.2 12.1 12.0 12.0  HCT 40.9 36.7 36.2 35.1* 36.7  MCV 88.0  87.8 88.5 87.8 88.0  PLT 267 277 263 196 219   CBG:  Recent Labs Lab 05/13/16 1202 05/13/16 1631 05/13/16 2105 05/14/16 0635 05/14/16 1200  GLUCAP 118* 202* 94 117* 193*    Lab Results  Component Value Date   INR 2.51 05/14/2016   INR 3.29 05/13/2016   INR 3.92 05/12/2016   Studies/Results: No results found. Medications:  . aspirin EC  81 mg Oral Daily  . calcitRIOL  0.5 mcg Oral Q M,W,F-HD  . collagenase   Topical Daily  . feeding supplement  1 Container Oral TID BM  . feeding supplement (PRO-STAT SUGAR FREE 64)  30 mL Oral BID  . HYDROmorphone  2 mg Oral Once  . insulin aspart  0-5 Units Subcutaneous QHS  . insulin aspart  0-9 Units Subcutaneous TID WC  . levothyroxine  175 mcg Oral QAC breakfast  . metoCLOPramide  5 mg Oral BID AC  . metoprolol tartrate  25 mg Oral BID  . multivitamin  1 tablet Oral QHS  . pantoprazole  40 mg Oral Daily  . polyethylene glycol  17 g Oral Daily  . pregabalin  75 mg Oral Daily  . tacrolimus  2 mg Oral BID  .  Warfarin - Pharmacist Dosing Inpatient   Does not apply q1800  . zinc sulfate  220 mg Oral Daily

## 2016-05-14 NOTE — Progress Notes (Signed)
ANTICOAGULATION CONSULT NOTE - Follow Up Consult  Pharmacy Consult for Coumadin Indication: AVR  Allergies  Allergen Reactions  . Acetaminophen Other (See Comments)    Liver transplant recipient   . Morphine Itching and Other (See Comments)    hallucinate  . Codeine Itching  . Mirtazapine Other (See Comments)    hallucination    Patient Measurements: Height: 5\' 4"  (162.6 cm) Weight: 103 lb (46.7 kg) IBW/kg (Calculated) : 54.7  Vital Signs: Temp: 98 F (36.7 C) (04/04 0519) Temp Source: Oral (04/04 0519) BP: 127/60 (04/04 0519) Pulse Rate: 80 (04/04 0519)  Labs:  Recent Labs  05/12/16 0508 05/12/16 1801 05/12/16 1813 05/13/16 0641 05/14/16 0649  HGB 12.2  --  12.1 12.0 12.0  HCT 36.7  --  36.2 35.1* 36.7  PLT 277  --  263 196 219  LABPROT 39.3*  --   --  34.2* 27.6*  INR 3.92  --   --  3.29 2.51  CREATININE  --  7.29*  --   --   --     Estimated Creatinine Clearance: 6 mL/min (A) (by C-G formula based on SCr of 7.29 mg/dL (H)).   Medications:  Scheduled:  . aspirin EC  81 mg Oral Daily  . calcitRIOL  0.5 mcg Oral Q M,W,F-HD  . collagenase   Topical Daily  . feeding supplement  1 Container Oral TID BM  . feeding supplement (PRO-STAT SUGAR FREE 64)  30 mL Oral BID  . HYDROmorphone  2 mg Oral Once  . insulin aspart  0-5 Units Subcutaneous QHS  . insulin aspart  0-9 Units Subcutaneous TID WC  . levothyroxine  175 mcg Oral QAC breakfast  . metoCLOPramide  5 mg Oral BID AC  . metoprolol tartrate  25 mg Oral BID  . multivitamin  1 tablet Oral QHS  . pantoprazole  40 mg Oral Daily  . polyethylene glycol  17 g Oral Daily  . pregabalin  75 mg Oral Daily  . tacrolimus  2 mg Oral BID  . Warfarin - Pharmacist Dosing Inpatient   Does not apply q1800  . zinc sulfate  220 mg Oral Daily    Assessment: 61yo female with AVR on therapeutic Coumadin.  INR is 2.51- decreased but within goal.  No bleeding noted.  Goal of Therapy:  INR 2.5-3.5 Monitor platelets by  anticoagulation protocol: Yes   Plan:  Repeat Couamdin 1.5mg  po today Daily INR  Gracy Bruins, PharmD [facility]

## 2016-05-14 NOTE — Progress Notes (Signed)
Occupational Therapy Discharge Summary  Patient Details  Name: [name] MRN: [id number] Date of Birth: 1956/01/10  Patient has met 12 of 12 long term goals due to improved activity tolerance, improved balance, ability to compensate for deficits and improved attention.  Patient to discharge at overall Supervision level.  Patient's care partner is independent to provide the necessary physical and cognitive assistance at discharge.    Reasons goals not met: n/a  Recommendation:  Patient will benefit from ongoing skilled OT services in home health setting to continue to advance functional skills in the area of BADL and iADL.  Equipment: bedside commode  Reasons for discharge: treatment goals met  Patient/family agrees with progress made and goals achieved: Yes  OT Discharge Precautions/Restrictions  Precautions Precautions: Fall Required Braces or Orthoses: Knee Immobilizer - Left Knee Immobilizer - Left: Other (comment) Restrictions Weight Bearing Restrictions: Yes LLE Weight Bearing: Non weight bearing ADL ADL ADL Comments: supervision overall Vision/Perception  Vision- History Baseline Vision/History: Wears glasses Wears Glasses: At all times Patient Visual Report: No change from baseline  Cognition Overall Cognitive Status: Within Functional Limits for tasks assessed Orientation Level: Oriented X4 Sensation Sensation Light Touch: Impaired Detail Light Touch Impaired Details: Impaired RLE Hot/Cold: Appears Intact Proprioception: Appears Intact Additional Comments: absent sensation to LT in big toe, impaired in other toes Coordination Gross Motor Movements are Fluid and Coordinated: Yes Fine Motor Movements are Fluid and Coordinated: Yes Motor  Motor Motor: Within Functional Limits Mobility  Bed Mobility Bed Mobility: Rolling Right;Rolling Left;Sit to Supine;Supine to Sit Rolling Right: 6: Modified independent (Device/Increase time) Rolling Left: 6:  Modified independent (Device/Increase time) Supine to Sit: 6: Modified independent (Device/Increase time) Sit to Supine: 6: Modified independent (Device/Increase time) Transfers Sit to Stand: 5: Supervision;With armrests Stand to Sit: 5: Supervision;With armrests  Trunk/Postural Assessment  Cervical Assessment Cervical Assessment: Within Functional Limits Thoracic Assessment Thoracic Assessment: Exceptions to [facility] (slightly flexed) Lumbar Assessment Lumbar Assessment: Exceptions to [facility] (posterior pelvic tilt) Postural Control Postural Control: Within Functional Limits  Balance Balance Balance Assessed: Yes Dynamic Standing Balance Dynamic Standing - Balance Support: Right upper extremity supported;During functional activity Dynamic Standing - Level of Assistance: 5: Stand by assistance Dynamic Standing - Comments: pulling up pants Extremity/Trunk Assessment RUE Assessment RUE Assessment: Within Functional Limits LUE Assessment LUE Assessment: Within Functional Limits   See Function Navigator for Current Functional Status.  [place] 05/14/2016, 12:20 PM

## 2016-05-14 NOTE — Progress Notes (Signed)
Social Work  Discharge Note  The overall goal for the admission was met for:   Discharge location: [place].  Length of Stay: Yes-20 DAYS  Discharge activity level: Yes-SUPERVISION-MIN ASSIST LEVEL  Home/community participation: Yes  Services provided included: MD, RD, PT, OT, RN, CM, TR, Pharmacy, Neuropsych and SW  Financial Services: Medicare and Private Insurance: [place]  Follow-up services arranged: Home Health: [place], DME: [facility] and Patient/Family request agency HH: [place], DME: NO PREF  Comments (or additional information):BOTH DAUGHTER;S WERE HERE TO GO THROUGH FAMILY TRAINIING, BOTH DID WELL. PLAN TO BUMP PT UP THE STEPS INTO HOME UNTIL RAMP COMPLETED. DAUGHTER TRANSPORTS PT TO HD-M, W, F. PT WILL HAVE 24 HR SUPERVISION FOR SHORT TIME DUE TO DAUGHTER TOOK LEAVE FROM WORK, OTHER DAUGHTER IN SCHOOL UNTIL 1;00 PM.   Patient/Family verbalized understanding of follow-up arrangements: Yes  Individual responsible for coordination of the follow-up plan: SELF & QUINNA-DAUGHTER  Confirmed correct DME delivered: Elease Hashimoto 05/14/2016    Elease Hashimoto

## 2016-05-14 NOTE — Progress Notes (Signed)
[place] PHYSICAL MEDICINE & REHABILITATION     PROGRESS NOTE  Subjective/Complaints: Pt seen laying in bed this AM.  She states she slept well and is sleeping better overall.    ROS: Denies CP, SOB, N/V/D.  Objective: Vital Signs: Blood pressure 127/60, pulse 80, temperature 98 F (36.7 C), temperature source Oral, resp. rate 18, height 5\' 4"  (1.626 m), weight 46.7 kg (103 lb), SpO2 95 %. No results found.  Recent Labs  05/13/16 0641 05/14/16 0649  WBC 10.5 11.1*  HGB 12.0 12.0  HCT 35.1* 36.7  PLT 196 219    Recent Labs  05/12/16 1801  NA 134*  K 3.7  CL 97*  GLUCOSE 118*  BUN 38*  CREATININE 7.29*  CALCIUM 8.0*   CBG (last 3)   Recent Labs  05/13/16 1631 05/13/16 2105 05/14/16 0635  GLUCAP 202* 94 117*    Wt Readings from Last 3 Encounters:  05/14/16 46.7 kg (103 lb)  04/30/16 54.7 kg (120 lb 9.5 oz)  04/17/16 58.7 kg (129 lb 6.4 oz)    Physical Exam:  BP 127/60 (BP Location: Right Arm)   Pulse 80   Temp 98 F (36.7 C) (Oral)   Resp 18   Ht 5\' 4"  (1.626 m)   Wt 46.7 kg (103 lb)   SpO2 95%   BMI 17.68 kg/m  Constitutional: She appears well-developed and well-nourished. NAD. HENT: Normocephalic and atraumatic. Edentulous   Eyes: EOMI. No discharge.  Cardiovascular: RRR. No JVD. Respiratory: normal effort. Clear GI: Soft. Bowel sounds are normal.  Musculoskeletal: She exhibits edema and tenderness.  Neurological: She is alert and oriented.  Motor: B/l UE 4/5 RLE: HF 3+/5, KE 3+/5, ADF/PF 4/5  LLE: HF 4/5  Skin: Skin is warm and dry. Dusky extremities  Right toes dusky,with boggy heel.   Stump with staples c/d/i Psychiatric: Normal mood and affect  Assessment/Plan: 1. Functional deficits secondary to left BKA which require 3+ hours per day of interdisciplinary therapy in a comprehensive inpatient rehab setting. Physiatrist is providing close team supervision and 24 hour management of active medical problems listed below. Physiatrist  and rehab team continue to assess barriers to discharge/monitor patient progress toward functional and medical goals.  Function:  Bathing Bathing position   Position: Wheelchair/chair at sink  Bathing parts Body parts bathed by patient: Right arm, Left arm, Chest, Abdomen, Buttocks, Right upper leg, Left upper leg, Right lower leg, Front perineal area Body parts bathed by helper: Back  Bathing assist Assist Level: Supervision or verbal cues      Upper Body Dressing/Undressing Upper body dressing   What is the patient wearing?: Pull over shirt/dress     Pull over shirt/dress - Perfomed by patient: Thread/unthread right sleeve, Thread/unthread left sleeve, Put head through opening          Upper body assist Assist Level: More than reasonable time   Set up : To obtain clothing/put away  Lower Body Dressing/Undressing Lower body dressing   What is the patient wearing?: Underwear, Pants Underwear - Performed by patient: Thread/unthread right underwear leg, Thread/unthread left underwear leg, Pull underwear up/down Underwear - Performed by helper: Pull underwear up/down Pants- Performed by patient: Thread/unthread right pants leg, Thread/unthread left pants leg, Pull pants up/down Pants- Performed by helper: Thread/unthread right pants leg, Thread/unthread left pants leg, Pull pants up/down Non-skid slipper socks- Performed by patient: Don/doff right sock Non-skid slipper socks- Performed by helper: Don/doff right sock  Lower body assist Assist for lower body dressing: Supervision or verbal cues      Toileting Toileting Toileting activity did not occur:  (does not void, no bm today) Toileting steps completed by patient: Adjust clothing prior to toileting, Performs perineal hygiene, Adjust clothing after toileting Toileting steps completed by helper: Adjust clothing after toileting Toileting Assistive Devices: Grab bar or rail  Toileting assist Assist level:  Touching or steadying assistance (Pt.75%)   Transfers Chair/bed transfer   Chair/bed transfer method: Squat pivot Chair/bed transfer assist level: Supervision or verbal cues Chair/bed transfer assistive device: Armrests     Locomotion Ambulation     Max distance: 10 Assist level: Touching or steadying assistance (Pt > 75%)   Wheelchair   Type: Manual Max wheelchair distance: 100 ft Assist Level: Supervision or verbal cues  Cognition Comprehension Comprehension assist level: Follows basic conversation/direction with no assist  Expression Expression assist level: Expresses basic needs/ideas: With no assist  Social Interaction Social Interaction assist level: Interacts appropriately 90% of the time - Needs monitoring or encouragement for participation or interaction.  Problem Solving Problem solving assist level: Solves basic 50 - 74% of the time/requires cueing 25 - 49% of the time  Memory Memory assist level: Recognizes or recalls 75 - 89% of the time/requires cueing 10 - 24% of the time    Medical Problem List and Plan: 1.  Gait abnormality, difficulty with transfers, limitation with self-care secondary to left BKA.  Cont CIR 2.  DVT Prophylaxis/Anticoagulation: Pharmaceutical:  Coumadin  --adjustment per pharmacy 3. Pain Management:L phantom limb pain D/Ced Gabapentin, increased  Lyrica to 75mg . Used dilaudid at home for CLBP.  Monitor with increased activity  Lidocaine ointment for pressure sores  -suspect chronic pain/behavioral component. Neuropsych recommended  -added robaxin for muscle spasms.  4. Mood: Improving with decrease in pain. LCSW to follow for evaluation and support.  5. Neuropsych: This patient is capable of making decisions on her own behalf. 6. Skin/Wound Care: Routine wound care. Stump healing-Appreciate VVS note  -pressure sore Right heel 7. Fluids/Electrolytes/Nutrition: Strict I/O. Serial labs with HD to monitor lytes 8. ESRD: Monitor weight daily  with strict I/O. Schedule HD late afternoons to help with tolerance of activity during the day.  9. CAD s/p CABG with AVR: Cont meds 10. 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 T2DM: Monitor BS ac/hs. Po intake poor--used 2 units levemir daily prn BS > 150. Will use SSI for elevated BS until intake is consistent.   Controlled 12. HTN: Monitor bid.   Labile at times, monitor in accordance with HD per nephro, patient has terminated HD AMA at times  Relatively controlled 4/4 13. H/o Migraines: Managed with prn medications.  14. Anemia of chronic disease: Aranesp weekly with transfusion prn.   Hb 12.0 on 4/4  Improved- Aranesp per nephro 15. COPD: Respiratory status stable.  16. Leucocytosis:.   WBCs 11.1 on 4/4 (stable)    afebrile 17.  PVD Right toes cool and dusky but no rest pain 18.  Nausea Improved- renal and hepatic failure, no diarrhea, trial reglan  LOS (Days) 14 A FACE TO FACE EVALUATION WAS PERFORMED  [name] [name] 05/14/2016 9:33 AM

## 2016-05-14 NOTE — Progress Notes (Signed)
Physical Therapy Session Note  Patient Details  Name: [name] MRN: [id number] Date of Birth: [birth year]  Today's Date: 05/14/2016 PT Individual Time: 1305-1405 PT Individual Time Calculation (min): 60 min   Short Term Goals: Week 2:  PT Short Term Goal 1 (Week 2): = LTGs due to anticipated LOS  Skilled Therapeutic Interventions/Progress Updates: Pt presented in w/c agreeable to therapy. Propelled to rehab gym with supervision.  Squat pivot to mat with supervision. Performed sit to supine with supervsion. Reviewed HEP incl, LAQ with 5 sec hold x 20, GS x 20, bridges over bolster x 20, SAQ bilaterally x 20,  sidelying hip abd/add on L x 20, sidelying hip ext (L) x 20. Pt overall demonstrated good understanding of HEP with min tactile cues for technique. Supine to sit with supervision and additional time required. Gait with RW 77f with close supervision.Pt returned to room via w/c propulsion. Pt left in room with family and needs met.      Therapy Documentation Precautions:  Precautions Precautions: Fall Required Braces or Orthoses: Knee Immobilizer - Left Knee Immobilizer - Left: Other (comment) Restrictions Weight Bearing Restrictions: Yes LLE Weight Bearing: Non weight bearing General:   Vital Signs:   Pain: Pain Assessment Pain Assessment: 0-10 Pain Score: 0-No pain Pain Type: Acute pain Pain Location: Leg Pain Orientation: Left Pain Descriptors / Indicators: Aching Pain Onset: On-going Pain Intervention(s): Repositioned;Rest   See Function Navigator for Current Functional Status.   Therapy/Group: Individual Therapy  Tian Davison  Hitoshi Werts, PTA  05/14/2016, 2:09 PM

## 2016-05-14 NOTE — Progress Notes (Signed)
Social Work Elease Hashimoto, LCSW Social Worker Signed   Patient Care Conference Date of Service: 05/14/2016  3:24 PM      Hide copied text Hover for attribution information Inpatient RehabilitationTeam Conference and Plan of Care Update Date: 05/14/2016   Time: 2:00 PM      Patient Name: [name]      Medical Record Number: [id number]  Date of Birth: 1955-09-18 Sex: Female         Room/Bed: 4W16C/4W16C-01 Payor Info: Payor: MEDICARE / Plan: MEDICARE PART A AND B / Product Type: *No Product type* /     Admitting Diagnosis: L BKA  Admit Date/Time:  04/30/2016  3:50 PM Admission Comments: No comment available    Primary Diagnosis:  <principal problem not specified> Principal Problem: <principal problem not specified>       Patient Active Problem List    Diagnosis Date Noted  . Supratherapeutic INR    . Anemia of chronic disease    . Chronic midline low back pain without sciatica    . ESRD on dialysis ([place])    . Type 2 diabetes mellitus with complication, with long-term current use of insulin ([place])    . S/P unilateral BKA (below knee amputation), left ([place]) 04/30/2016  . Unilateral complete BKA, left, initial encounter ([place]) 04/30/2016  . Abnormality of gait    . Coronary artery disease involving coronary bypass graft of native heart without angina pectoris    . Benign essential HTN    . History of migraine    . Leukocytosis    . Critical lower limb ischemia 04/22/2016  . Foot pain 04/21/2016  . Fever    . Advance care planning    . Goals of care, counseling/discussion    . Palliative care by specialist    . Sepsis ([place])    . Hypotension    . Claudication of left lower extremity ([place])    . Elevated troponin 03/05/2016  . Pressure injury of skin, stage 2 11/27/2015  . H/O unilateral nephrectomy 07/06/2015  . Paroxysmal SVT (supraventricular tachycardia) ([place]) 06/22/2014  . Complications due to renal dialysis device, implant, and graft 05/11/2014  . Acute blood loss  anemia 03/31/2014  . Renal dialysis device, implant, or graft complication 51/76/1607  . Acute hyperkalemia    . S/P liver transplant ([place])    . Chronic hepatitis C without hepatic coma ([place])    . Pulmonary edema 01/03/2014  . Respiratory failure ([place]) 01/03/2014  . Long term current use of anticoagulant therapy 04/22/2013  . Chronic anticoagulation w/ coumadin, goal INR 2.5-3.0 w/ AVR 04/14/2013  . Tobacco abuse 02/28/2013  . COPD (chronic obstructive pulmonary disease) ([place]) 02/21/2013  . Chronic combined systolic and diastolic congestive heart failure ([place]) 02/06/2013  . Acute respiratory failure ([place]) 11/02/2012  . Acute pulmonary edema ([place]) 11/02/2012  . Acute on chronic systolic CHF (congestive heart failure) ([place]) 11/02/2012  . Dyslipidemia-LDL 104, not on statin with Hx of liver transplant 07/30/2012  . CAD (coronary artery disease), native coronary artery 07/27/2012  . History of prosthetic aortic valve replacement    . End stage renal disease on dialysis ([place]) 02/27/2011  . Hypothyroidism 06/25/2006  . Type 2 diabetes mellitus with chronic kidney disease ([place]) 06/25/2006  . GERD 06/25/2006  . Anemia due to chronic renal failure     . Hypertensive heart disease        Expected Discharge Date: Expected Discharge Date: 05/15/16   Team Members Present: Physician leading conference: Dr.  Ankit Patel Social Worker Present: Ovidio Kin, LCSW Nurse Present: Dorthula Nettles, RN PT Present: Carney Living, PT OT Present: Willeen Cass, OT SLP Present: Stormy Fabian, SLP PPS Coordinator present : Daiva Nakayama, RN, CRRN       Current Status/Progress Goal Weekly Team Focus  Medical     Gait abnormality, difficulty with transfers, limitation with self-care secondary to left BKA  Improve mobility, transfers, pain  See above   Bowel/Bladder     Anuric. Continent of bowel 4-2  BM QD, QOD.  No s/s infection, retention. Laxatives as needed.  assess bowel patteren     Swallow/Nutrition/ Hydration               ADL's     LTGs met  Mod I/supervision from w/c level  Pt is ready for discharge   Mobility     S-min A  supervision-min A  functional mobility training, LLE therex, DC planning, family training with 2 daughters   Communication               Safety/Cognition/ Behavioral Observations             Pain     phantom pain. 29m diludid BID. Ultram 228mq 6hrs PRN  Managed at goal 3/10.  assess pain and medicate as needed   Skin     stage II coccyx. santyl ordered. Right heel boggy-deep tissue; staples to RBKA stump shinker inpalce  St. 2 resolving at discharge. No further breakdown, injury. Pt. knowledgable re: pressure release, control.  assess skin q shift. chnage dressings as ordered     *See Care Plan and progress notes for long and short-term goals.   Barriers to Discharge: CKD, Liver failure, mobility     Possible Resolutions to Barriers:  Therapies, d/c tomorrow, improving     Discharge Planning/Teaching Needs:  Daughter's were in yesterday for family education and it went well. Preparing for DC Thursday      Team Discussion:  Reaching goals of supervision-min level. Medically stable for discharge tomorrow. Pain controlled and labs stable. Ambulated 50 feet today in PT. Family education completed and ready for DC tomorrow.  Revisions to Treatment Plan:  DC tomorrow    Continued Need for Acute Rehabilitation Level of Care: The patient requires daily medical management by a physician with specialized training in physical medicine and rehabilitation for the following conditions: Daily direction of a multidisciplinary physical rehabilitation program to ensure safe treatment while eliciting the highest outcome that is of practical value to the patient.: Yes Daily medical management of patient stability for increased activity during participation in an intensive rehabilitation regime.: Yes Daily analysis of laboratory values and/or  radiology reports with any subsequent need for medication adjustment of medical intervention for : Diabetes problems;Post surgical problems;Renal problems   DuElease Hashimoto/05/2016, 3:24 PM      ReElease HashimotoLCSW Social Worker Signed   Patient Care Conference Date of Service: 05/07/2016  1:19 PM      Hide copied text Hover for attribution information Inpatient RehabilitationTeam Conference and Plan of Care Update Date: 05/07/2016   Time: 10:10 AM      Patient Name: [name]    Medical Record Number: [id number] of Birth: 7/11-09-1957ex: Female         Room/Bed: 4W16C/4W16C-01 Payor Info: Payor: MEDICARE / Plan: MEDICARE PART A AND B / Product Type: *No Product type* /     Admitting Diagnosis: L BKA  Admit Date/Time:  04/30/2016  3:50 PM Admission Comments: No comment available    Primary Diagnosis:  <principal problem not specified> Principal Problem: <principal problem not specified>       Patient Active Problem List    Diagnosis Date Noted  . Supratherapeutic INR    . Anemia of chronic disease    . Chronic midline low back pain without sciatica    . ESRD on dialysis ([place])    . Type 2 diabetes mellitus with complication, with long-term current use of insulin ([place])    . S/P unilateral BKA (below knee amputation), left ([place]) 04/30/2016  . Unilateral complete BKA, left, initial encounter ([place]) 04/30/2016  . Abnormality of gait    . Coronary artery disease involving coronary bypass graft of native heart without angina pectoris    . Benign essential HTN    . History of migraine    . Leukocytosis    . Critical lower limb ischemia 04/22/2016  . Foot pain 04/21/2016  . Fever    . Advance care planning    . Goals of care, counseling/discussion    . Palliative care by specialist    . Sepsis ([place])    . Hypotension    . Claudication of left lower extremity ([place])    . Elevated troponin 03/05/2016  . Pressure injury of skin, stage 2 11/27/2015  . H/O  unilateral nephrectomy 07/06/2015  . Paroxysmal SVT (supraventricular tachycardia) ([place]) 06/22/2014  . Complications due to renal dialysis device, implant, and graft 05/11/2014  . Acute blood loss anemia 03/31/2014  . Renal dialysis device, implant, or graft complication 81/02/7508  . Acute hyperkalemia    . S/P liver transplant ([place])    . Chronic hepatitis C without hepatic coma ([place])    . Pulmonary edema 01/03/2014  . Respiratory failure ([place]) 01/03/2014  . Long term current use of anticoagulant therapy 04/22/2013  . Chronic anticoagulation w/ coumadin, goal INR 2.5-3.0 w/ AVR 04/14/2013  . Tobacco abuse 02/28/2013  . COPD (chronic obstructive pulmonary disease) ([place]) 02/21/2013  . Chronic combined systolic and diastolic congestive heart failure ([place]) 02/06/2013  . Acute respiratory failure ([place]) 11/02/2012  . Acute pulmonary edema ([place]) 11/02/2012  . Acute on chronic systolic CHF (congestive heart failure) ([place]) 11/02/2012  . Dyslipidemia-LDL 104, not on statin with Hx of liver transplant 07/30/2012  . CAD (coronary artery disease), native coronary artery 07/27/2012  . History of prosthetic aortic valve replacement    . End stage renal disease on dialysis ([place]) 02/27/2011  . Hypothyroidism 06/25/2006  . Type 2 diabetes mellitus with chronic kidney disease ([place]) 06/25/2006  . GERD 06/25/2006  . Anemia due to chronic renal failure     . Hypertensive heart disease        Expected Discharge Date: Expected Discharge Date: 05/13/16   Team Members Present: Physician leading conference: Ilean Skill, PsyD;Dr. Alysia Penna Social Worker Present: Ovidio Kin, LCSW Nurse Present: Elliot Cousin, RN PT Present: Carney Living, PT OT Present: Willeen Cass, OT PPS Coordinator present : Daiva Nakayama, RN, CRRN       Current Status/Progress Goal Weekly Team Focus  Medical     left BKA, phantom pain an issue,   maintain med stability  pain meds, trial stump shrinker     Bowel/Bladder     Anuric, HD M.W.F.,  continent bowel  BM QD, QOD.  No s/s infection, retention. Laxatives as needed.  Monitor   Swallow/Nutrition/ Hydration  ADL's     Min A overall  Mod I/supervision  pt/family education, functoinal transfers, standing balance, modified bathing/dressing, simple iADL at w/c level   Mobility     min A  supervision-min A  functional transfers, ambulation, standing balance, LLE strengthening/ROM, pain management, pt/family education   Communication     min A sit to stand and stand pivot with RW, min A for balance with LB dressing and bathing  S toileting, toilet transfer to BSC, standing with RW, bathing, LB dressing; mod I UB dressing, grooming, simple meal prep w/c level, and housekeeping w/c level  ADL training, pain tolerance, pt/family education, psychosocial support   Safety/Cognition/ Behavioral Observations   No unsafe behaviors, calls for assist, anticipates needs.  Increased safety awareness, no falls, injury this admission.  Monitor, bed alarm.   Pain     C/O phantom and incisional pain/discomfort.  Dilaudid 36m q12hrs. PRN, scheduled robaxin effective.  Managed at goal 2/10.  Monitor, educate re: phantom pain, relief.   Skin     Stage 2 at coccyx, santyl...Rt. heel, great toe boggy, no open areas, cool to touch. Incision with scant serous drainage.  St. 2 resolving at discharge. No further breakdown, injury. Pt. knowledgable re: pressure release, control.  Continue dressing changes, boosting, education.     *See Care Plan and progress notes for long and short-term goals.   Barriers to Discharge: Debility chronic due to CKD, Liver failure     Possible Resolutions to Barriers:  cont rehab     Discharge Planning/Teaching Needs:  Home with daughter's assisting her, daughter's have been here to observe in therapies. Neuro-psych to see today for coping      Team Discussion:  Goals supervision-min assist level, tends to lose her  balance posterioraly. Neuro-psych saw and pt seems to be brighter. Back on home pain meds according to MD. Will order stump shrinker. Daughter here daily participating in therapies with pt. Ramp being built on Tuesday  Revisions to Treatment Plan:  DC 4/3    Continued Need for Acute Rehabilitation Level of Care: The patient requires daily medical management by a physician with specialized training in physical medicine and rehabilitation for the following conditions: Daily direction of a multidisciplinary physical rehabilitation program to ensure safe treatment while eliciting the highest outcome that is of practical value to the patient.: Yes Daily medical management of patient stability for increased activity during participation in an intensive rehabilitation regime.: Yes Daily analysis of laboratory values and/or radiology reports with any subsequent need for medication adjustment of medical intervention for : Neurological problems;Diabetes problems;Post surgical problems   DElease Hashimoto3/30/2018, 10:40 AM       Patient ID: [name] female   DOB: [birth year] 61y.o.   MRN: [id number]

## 2016-05-14 NOTE — Progress Notes (Signed)
Physical Therapy Discharge Summary  Patient Details  Name: [name] MRN: [id number] Date of Birth: 1955-02-20  Today's Date: 05/14/2016 PT Individual Time: 0920-1030 PT Individual Time Calculation (min): 70 min    Patient has met 9 of 9 long term goals due to improved activity tolerance, improved balance, improved postural control, increased strength, increased range of motion, decreased pain, ability to compensate for deficits, improved attention and improved awareness.  Patient to discharge at a wheelchair level Supervision.   Patient's care partner is independent to provide the necessary physical and cognitive assistance at discharge.  Reasons goals not met: NA  Recommendation:  Patient will benefit from ongoing skilled PT services in home health setting to continue to advance safe functional mobility, address ongoing impairments in LE strength, ROM, standing balance, activity tolerance, and minimize fall risk.  Equipment: manual wheelchair with amputee support pad and   Reasons for discharge: treatment goals met and discharge from hospital  Patient/family agrees with progress made and goals achieved: Yes  Skilled Therapeutic Intervention Patient in bed upon arrival. Performed LB dressing with sit <> stand from EOB using RW with supervision to pull pants over hips and donned sock sitting EOB with supervision/setup. Patient performed stand pivot and squat pivot transfers, wheelchair propulsion using BUE, simulated car transfer via stand pivot using RW, and gait using RW x 35 ft with close supervision. Patient continues to require cues and increased time for wheelchair parts management and wheelchair setup in preparation for transfers. Patient propelled wheelchair up ramp with multiple attempts to clear threshold with supervision and down ramp with min A due to difficulty controlling speed. Patient negotiated up/down one 3" step using 2 rails, ascending forward and descending backward  with mod A and cues to push through BUE instead of "jumping" on RLE. Performed bed mobility on flat surface with mod I. Instructed in seated L quad sets with 5 sec hold, seated L LAQ x 20, prone L hip extension x 10, and prone positioning for prolonged L hip flexor stretch x 5 min. Patient provided handout for LLE therex for strengthening and ROM and instructed in prone lying x 30 min daily at home. Patient with no further questions/concerns regarding discharge and family education completed yesterday. Patient left sitting in wheelchair with all needs within reach.   PT Discharge Precautions/Restrictions Precautions Precautions: Fall Required Braces or Orthoses: Knee Immobilizer - Left Knee Immobilizer - Left: Other (comment) Restrictions Weight Bearing Restrictions: Yes LLE Weight Bearing: Non weight bearing Pain Pain Assessment Pain Assessment: 0-10 Pain Score: 6  Pain Type: Acute pain Pain Location: Leg Pain Orientation: Left Pain Descriptors / Indicators: Aching Pain Frequency: Intermittent Pain Onset: On-going Pain Intervention(s): Repositioned;Rest Multiple Pain Sites: No Vision/Perception   No change from baseline  Cognition Overall Cognitive Status: Within Functional Limits for tasks assessed Sensation Sensation Light Touch: Impaired Detail Light Touch Impaired Details: Impaired RLE Hot/Cold: Appears Intact Proprioception: Appears Intact Additional Comments: absent sensation to LT in big toe, impaired in other toes Coordination Gross Motor Movements are Fluid and Coordinated: Yes Fine Motor Movements are Fluid and Coordinated: Yes Motor  Motor Motor: Within Functional Limits  Mobility Bed Mobility Bed Mobility: Rolling Right;Rolling Left;Sit to Supine;Supine to Sit Rolling Right: 6: Modified independent (Device/Increase time) Rolling Left: 6: Modified independent (Device/Increase time) Supine to Sit: 6: Modified independent (Device/Increase time) Sit to Supine:  6: Modified independent (Device/Increase time) Transfers Transfers: Yes Sit to Stand: 5: Supervision;With armrests Stand to Sit: 5: Supervision;With armrests Locomotion  Ambulation  Ambulation: Yes Ambulation/Gait Assistance: 5: Supervision Ambulation Distance (Feet): 35 Feet Assistive device: Rolling walker Gait Gait: Yes Gait Pattern: Impaired Gait Pattern:  (hop-to) Gait velocity: decreased, 10 MWT = 0.07 m/s Stairs / Additional Locomotion Ramp: 5: Supervision (WC) Wheelchair Mobility Wheelchair Mobility: Yes Wheelchair Assistance: 5: Careers information officer: Both upper extremities Wheelchair Parts Management: Supervision/cueing Distance: 150 ft  Trunk/Postural Assessment  Cervical Assessment Cervical Assessment: Within Functional Limits Thoracic Assessment Thoracic Assessment: Exceptions to [facility] (slightly flexed) Lumbar Assessment Lumbar Assessment: Exceptions to [facility] (posterior pelvic tilt) Postural Control Postural Control: Within Functional Limits  Balance Balance Balance Assessed: Yes Dynamic Standing Balance Dynamic Standing - Balance Support: Right upper extremity supported;During functional activity Dynamic Standing - Level of Assistance: 5: Stand by assistance Dynamic Standing - Comments: pulling up pants Extremity Assessment  RUE Assessment RUE Assessment: Within Functional Limits LUE Assessment LUE Assessment: Within Functional Limits RLE Assessment RLE Assessment: Exceptions to [facility] RLE Strength RLE Overall Strength: Deficits RLE Overall Strength Comments: grossly 5/5 except hip flexion 4-/5, boggy heel LLE Assessment LLE Assessment: Within Functional Limits (hip flexion 4/5, knee flexion/extension 5/5, ankle NT due to BKA)   See Function Navigator for Current Functional Status.  Vannah Nadal, [place] Guiles A 05/14/2016, 10:03 AM

## 2016-05-14 NOTE — Patient Care Conference (Signed)
Inpatient RehabilitationTeam Conference and Plan of Care Update Date: 05/14/2016   Time: 2:00 PM    Patient Name: [name]      Medical Record Number: [id number]  Date of Birth: February 21, 1955 Sex: Female         Room/Bed: 4W16C/4W16C-01 Payor Info: Payor: MEDICARE / Plan: MEDICARE PART A AND B / Product Type: *No Product type* /    Admitting Diagnosis: L BKA  Admit Date/Time:  04/30/2016  3:50 PM Admission Comments: No comment available   Primary Diagnosis:  <principal problem not specified> Principal Problem: <principal problem not specified>  Patient Active Problem List   Diagnosis Date Noted  . Supratherapeutic INR   . Anemia of chronic disease   . Chronic midline low back pain without sciatica   . ESRD on dialysis ([place])   . Type 2 diabetes mellitus with complication, with long-term current use of insulin ([place])   . S/P unilateral BKA (below knee amputation), left ([place]) 04/30/2016  . Unilateral complete BKA, left, initial encounter ([place]) 04/30/2016  . Abnormality of gait   . Coronary artery disease involving coronary bypass graft of native heart without angina pectoris   . Benign essential HTN   . History of migraine   . Leukocytosis   . Critical lower limb ischemia 04/22/2016  . Foot pain 04/21/2016  . Fever   . Advance care planning   . Goals of care, counseling/discussion   . Palliative care by specialist   . Sepsis ([place])   . Hypotension   . Claudication of left lower extremity ([place])   . Elevated troponin 03/05/2016  . Pressure injury of skin, stage 2 11/27/2015  . H/O unilateral nephrectomy 07/06/2015  . Paroxysmal SVT (supraventricular tachycardia) ([place]) 06/22/2014  . Complications due to renal dialysis device, implant, and graft 05/11/2014  . Acute blood loss anemia 03/31/2014  . Renal dialysis device, implant, or graft complication 40/98/1191  . Acute hyperkalemia   . S/P liver transplant ([place])   . Chronic hepatitis C without hepatic coma ([place])   . Pulmonary  edema 01/03/2014  . Respiratory failure ([place]) 01/03/2014  . Long term current use of anticoagulant therapy 04/22/2013  . Chronic anticoagulation w/ coumadin, goal INR 2.5-3.0 w/ AVR 04/14/2013  . Tobacco abuse 02/28/2013  . COPD (chronic obstructive pulmonary disease) ([place]) 02/21/2013  . Chronic combined systolic and diastolic congestive heart failure ([place]) 02/06/2013  . Acute respiratory failure ([place]) 11/02/2012  . Acute pulmonary edema ([place]) 11/02/2012  . Acute on chronic systolic CHF (congestive heart failure) ([place]) 11/02/2012  . Dyslipidemia-LDL 104, not on statin with Hx of liver transplant 07/30/2012  . CAD (coronary artery disease), native coronary artery 07/27/2012  . History of prosthetic aortic valve replacement   . End stage renal disease on dialysis ([place]) 02/27/2011  . Hypothyroidism 06/25/2006  . Type 2 diabetes mellitus with chronic kidney disease ([place]) 06/25/2006  . GERD 06/25/2006  . Anemia due to chronic renal failure    . Hypertensive heart disease     Expected Discharge Date: Expected Discharge Date: 05/15/16  Team Members Present: Physician leading conference: Dr. Delice Lesch Social Worker Present: Ovidio Kin, LCSW Nurse Present: Dorthula Nettles, RN PT Present: Carney Living, PT OT Present: Willeen Cass, OT SLP Present: Stormy Fabian, SLP PPS Coordinator present : Daiva Nakayama, RN, CRRN     Current Status/Progress Goal Weekly Team Focus  Medical   Gait abnormality, difficulty with transfers, limitation with self-care secondary to left BKA  Improve mobility, transfers, pain  See  above   Bowel/Bladder   Anuric. Continent of bowel 4-2  BM QD, QOD.  No s/s infection, retention. Laxatives as needed.  assess bowel patteren   Swallow/Nutrition/ Hydration             ADL's   LTGs met  Mod I/supervision from w/c level  Pt is ready for discharge   Mobility   S-min A  supervision-min A  functional mobility training, LLE therex, DC planning, family training  with 2 daughters   Communication             Safety/Cognition/ Behavioral Observations            Pain   phantom pain. 31m diludid BID. Ultram 248mq 6hrs PRN  Managed at goal 3/10.  assess pain and medicate as needed   Skin   stage II coccyx. santyl ordered. Right heel boggy-deep tissue; staples to RBKA stump shinker inpalce  St. 2 resolving at discharge. No further breakdown, injury. Pt. knowledgable re: pressure release, control.  assess skin q shift. chnage dressings as ordered      *See Care Plan and progress notes for long and short-term goals.  Barriers to Discharge: CKD, Liver failure, mobility    Possible Resolutions to Barriers:  Therapies, d/c tomorrow, improving    Discharge Planning/Teaching Needs:  Daughter's were in yesterday for family education and it went well. Preparing for DC Thursday      Team Discussion:  Reaching goals of supervision-min level. Medically stable for discharge tomorrow. Pain controlled and labs stable. Ambulated 50 feet today in PT. Family education completed and ready for DC tomorrow.  Revisions to Treatment Plan:  DC tomorrow   Continued Need for Acute Rehabilitation Level of Care: The patient requires daily medical management by a physician with specialized training in physical medicine and rehabilitation for the following conditions: Daily direction of a multidisciplinary physical rehabilitation program to ensure safe treatment while eliciting the highest outcome that is of practical value to the patient.: Yes Daily medical management of patient stability for increased activity during participation in an intensive rehabilitation regime.: Yes Daily analysis of laboratory values and/or radiology reports with any subsequent need for medication adjustment of medical intervention for : Diabetes problems;Post surgical problems;Renal problems  DuElease Hashimoto/05/2016, 3:24 PM

## 2016-05-15 DIAGNOSIS — G546 Phantom limb syndrome with pain: Secondary | ICD-10-CM

## 2016-05-15 DIAGNOSIS — R791 Abnormal coagulation profile: Secondary | ICD-10-CM | POA: Insufficient documentation

## 2016-05-15 LAB — CBC
HCT: 33.9 % — ABNORMAL LOW (ref 36.0–46.0)
Hemoglobin: 11.2 g/dL — ABNORMAL LOW (ref 12.0–15.0)
MCH: 29.1 pg (ref 26.0–34.0)
MCHC: 33 g/dL (ref 30.0–36.0)
MCV: 88.1 fL (ref 78.0–100.0)
Platelets: 224 10*3/uL (ref 150–400)
RBC: 3.85 MIL/uL — ABNORMAL LOW (ref 3.87–5.11)
RDW: 17.5 % — AB (ref 11.5–15.5)
WBC: 13.4 10*3/uL — ABNORMAL HIGH (ref 4.0–10.5)

## 2016-05-15 LAB — GLUCOSE, CAPILLARY
GLUCOSE-CAPILLARY: 113 mg/dL — AB (ref 65–99)
GLUCOSE-CAPILLARY: 132 mg/dL — AB (ref 65–99)
Glucose-Capillary: 214 mg/dL — ABNORMAL HIGH (ref 65–99)
Glucose-Capillary: 163 mg/dL — ABNORMAL HIGH (ref 65–99)

## 2016-05-15 LAB — PROTIME-INR
INR: 1.89
PROTHROMBIN TIME: 21.9 s — AB (ref 11.4–15.2)

## 2016-05-15 LAB — HEPARIN LEVEL (UNFRACTIONATED): HEPARIN UNFRACTIONATED: 0.55 [IU]/mL (ref 0.30–0.70)

## 2016-05-15 MED ORDER — HYDROCORTISONE ACETATE 25 MG RE SUPP
25.0000 mg | Freq: Two times a day (BID) | RECTAL | Status: DC
  Administered 2016-05-16 – 2016-05-18 (×3): 25 mg via RECTAL
  Filled 2016-05-15 (×8): qty 1

## 2016-05-15 MED ORDER — WARFARIN SODIUM 2.5 MG PO TABS
2.5000 mg | ORAL_TABLET | Freq: Once | ORAL | Status: AC
Start: 2016-05-15 — End: 2016-05-15
  Administered 2016-05-15: 2.5 mg via ORAL
  Filled 2016-05-15: qty 1

## 2016-05-15 MED ORDER — POLYETHYLENE GLYCOL 3350 17 G PO PACK
17.0000 g | PACK | Freq: Two times a day (BID) | ORAL | Status: DC
  Administered 2016-05-15 – 2016-05-17 (×3): 17 g via ORAL
  Filled 2016-05-15 (×8): qty 1

## 2016-05-15 MED ORDER — HEPARIN (PORCINE) IN NACL 100-0.45 UNIT/ML-% IJ SOLN
1100.0000 [IU]/h | INTRAMUSCULAR | Status: DC
  Administered 2016-05-15: 1400 [IU]/h via INTRAVENOUS
  Administered 2016-05-16: 1300 [IU]/h via INTRAVENOUS
  Administered 2016-05-16: 1400 [IU]/h via INTRAVENOUS
  Administered 2016-05-17 – 2016-05-18 (×2): 1100 [IU]/h via INTRAVENOUS
  Filled 2016-05-15 (×6): qty 250

## 2016-05-15 NOTE — Progress Notes (Signed)
ANTICOAGULATION CONSULT NOTE - Follow Up Consult  Pharmacy Consult for heparin and coumadin Indication: AVR  Allergies  Allergen Reactions  . Acetaminophen Other (See Comments)    Liver transplant recipient   . Morphine Itching and Other (See Comments)    hallucinate  . Codeine Itching  . Mirtazapine Other (See Comments)    hallucination    Patient Measurements: Height: 5\' 4"  (162.6 cm) Weight: 95 lb 7.4 oz (43.3 kg) IBW/kg (Calculated) : 54.7 Heparin Dosing Weight: 43.3 kg  Vital Signs: Temp: 98.7 F (37.1 C) (04/05 0441) Temp Source: Oral (04/05 0441) BP: 140/44 (04/05 0441) Pulse Rate: 80 (04/05 0441)  Labs:  Recent Labs  05/12/16 1801  05/13/16 0641 05/14/16 0649 05/15/16 0551  HGB  --   < > 12.0 12.0 11.2*  HCT  --   < > 35.1* 36.7 33.9*  PLT  --   < > 196 219 224  LABPROT  --   --  34.2* 27.6* 21.9*  INR  --   --  3.29 2.51 1.89  CREATININE 7.29*  --   --   --   --   < > = values in this interval not displayed.  Estimated Creatinine Clearance: 5.5 mL/min (A) (by C-G formula based on SCr of 7.29 mg/dL (H)).   Medications:  Scheduled:  . aspirin EC  81 mg Oral Daily  . calcitRIOL  0.5 mcg Oral Q M,W,F-HD  . collagenase   Topical Daily  . feeding supplement  1 Container Oral TID BM  . feeding supplement (PRO-STAT SUGAR FREE 64)  30 mL Oral BID  . HYDROmorphone  2 mg Oral Once  . insulin aspart  0-5 Units Subcutaneous QHS  . insulin aspart  0-9 Units Subcutaneous TID WC  . levothyroxine  175 mcg Oral QAC breakfast  . metoCLOPramide  5 mg Oral BID AC  . metoprolol tartrate  25 mg Oral BID  . multivitamin  1 tablet Oral QHS  . pantoprazole  40 mg Oral Daily  . polyethylene glycol  17 g Oral Daily  . pregabalin  75 mg Oral Daily  . tacrolimus  2 mg Oral BID  . Warfarin - Pharmacist Dosing Inpatient   Does not apply q1800  . zinc sulfate  220 mg Oral Daily   Infusions:    Assessment: 61 yo female with AVR is currently on subtherapeutic coumadin.   INR is down to 1.89.  Per PA patient is eating better which may affect result.  Will restart heparin to bridge.  Goal of Therapy:  Heparin level 0.3-0.7 units/ml; INR 2.5-3.5 Monitor platelets by anticoagulation protocol: Yes   Plan:  -Coumadin 2.5 mg po x1 (per PA patient is eating better) - Restart heparin at 1400 units/hr based on previous level - 8 hr heparin level -Daily INR, heparin level and CBC -Monitor S/Sx bleeding  Kanda Deluna, Tsz-Yin 05/15/2016,8:35 AM

## 2016-05-15 NOTE — Progress Notes (Signed)
ANTICOAGULATION CONSULT NOTE - Follow Up Consult  Pharmacy Consult for heparin and coumadin Indication: AVR  Allergies  Allergen Reactions  . Acetaminophen Other (See Comments)    Liver transplant recipient   . Morphine Itching and Other (See Comments)    hallucinate  . Codeine Itching  . Mirtazapine Other (See Comments)    hallucination    Patient Measurements: Height: 5\' 4"  (162.6 cm) Weight: 95 lb 7.4 oz (43.3 kg) IBW/kg (Calculated) : 54.7 Heparin Dosing Weight: 43.3 kg  Vital Signs: Temp: 98.8 F (37.1 C) (04/05 1436) Temp Source: Oral (04/05 1436) BP: 140/56 (04/05 1436) Pulse Rate: 76 (04/05 1436)  Labs:  Recent Labs  05/13/16 0641 05/14/16 0649 05/15/16 0551 05/15/16 1833  HGB 12.0 12.0 11.2*  --   HCT 35.1* 36.7 33.9*  --   PLT 196 219 224  --   LABPROT 34.2* 27.6* 21.9*  --   INR 3.29 2.51 1.89  --   HEPARINUNFRC  --   --   --  0.55    Estimated Creatinine Clearance: 5.5 mL/min (A) (by C-G formula based on SCr of 7.29 mg/dL (H)).   Medications:  Scheduled:  . aspirin EC  81 mg Oral Daily  . calcitRIOL  0.5 mcg Oral Q M,W,F-HD  . collagenase   Topical Daily  . feeding supplement  1 Container Oral TID BM  . feeding supplement (PRO-STAT SUGAR FREE 64)  30 mL Oral BID  . hydrocortisone  25 mg Rectal BID  . HYDROmorphone  2 mg Oral Once  . insulin aspart  0-5 Units Subcutaneous QHS  . insulin aspart  0-9 Units Subcutaneous TID WC  . levothyroxine  175 mcg Oral QAC breakfast  . metoCLOPramide  5 mg Oral BID AC  . metoprolol tartrate  25 mg Oral BID  . multivitamin  1 tablet Oral QHS  . pantoprazole  40 mg Oral Daily  . polyethylene glycol  17 g Oral BID  . pregabalin  75 mg Oral Daily  . tacrolimus  2 mg Oral BID  . Warfarin - Pharmacist Dosing Inpatient   Does not apply q1800  . zinc sulfate  220 mg Oral Daily   Infusions:  . heparin 1,400 Units/hr (05/15/16 1114)    Assessment: 61 yo female with AVR is currently on subtherapeutic  coumadin. INR is down to 1.89 - received warfarin 2.5mg  dose tonight. Per PA patient is eating better which may affect result. Continuing on heparin bridge.  Heparin level therapeutic (0.55) on 1400 units/h. CBC stable. No bleed documented.  Goal of Therapy:  Heparin level 0.3-0.7 units/ml; INR 2.5-3.5 Monitor platelets by anticoagulation protocol: Yes   Plan:  - Continue heparin at 1400 units/hr - 8 hr heparin level to confirm - Daily INR, heparin level, and CBC - Monitor S/Sx bleeding  Elicia Lamp, PharmD, BCPS Clinical Pharmacist 05/15/2016 7:47 PM

## 2016-05-15 NOTE — Progress Notes (Signed)
[facility]IC[facility] RE[facility]ABILIT[facility]     PROGRESS NOTE  Subjective/Complaints: Pt seen laying in bed this AM.  She slept well overnight.  She notes phantom limb pain. INR noted to be subtherapeutic.   ROS: +Phantom limb pain. Denies CP, SOB, N/V/D.  Objective: Vital Signs: Blood pressure (!) 140/44, pulse 80, temperature 98.7 F (37.1 C), temperature source Oral, resp. rate 16, height 5\' 4"  (1.626 m), weight 43.3 kg (95 lb 7.4 oz), SpO2 99 %. No results found.  Recent Labs  05/14/16 0649 05/15/16 0551  WBC 11.1* 13.4*  HGB 12.0 11.2*  HCT 36.7 33.9*  PLT 219 224    Recent Labs  05/12/16 1801  NA 134*  K 3.7  CL 97*  GLUCOSE 118*  BUN 38*  CREATININE 7.29*  CALCIUM 8.0*   CBG (last 3)   Recent Labs  05/14/16 1200 05/14/16 2120 05/15/16 0627  GLUCAP 193* 151* 113*    Wt Readings from Last 3 Encounters:  05/15/16 43.3 kg (95 lb 7.4 oz)  04/30/16 54.7 kg (120 lb 9.5 oz)  04/17/16 58.7 kg (129 lb 6.4 oz)    Physical Exam:  BP (!) 140/44 (BP Location: Right Arm)   Pulse 80   Temp 98.7 F (37.1 C) (Oral)   Resp 16   Ht 5\' 4"  (1.626 m)   Wt 43.3 kg (95 lb 7.4 oz)   SpO2 99%   BMI 16.39 kg/m  Constitutional: She appears well-developed and well-nourished. NAD. HENT: Normocephalic and atraumatic. Edentulous   Eyes: EOMI. No discharge.  Cardiovascular: RRR. No JVD. Respiratory: normal effort. Clear GI: Soft. Bowel sounds are normal.  Musculoskeletal: She exhibits edema and tenderness.  Neurological: She is alert and oriented.  Motor: B/l UE 4/5 RLE: HF 3+/5, KE 3+/5, ADF/PF 4/5  LLE: HF 4/5 (unchanged) Skin: Skin is warm and dry. Dusky extremities  Right toes dusky,with boggy heel.   Stump with staples c/d/i Psychiatric: Normal mood and affect  Assessment/Plan: 1. Functional deficits secondary to left BKA which require 3+ hours per day of interdisciplinary therapy in a comprehensive inpatient rehab setting. Physiatrist is providing close  team supervision and 24 hour management of active medical problems listed below. Physiatrist and rehab team continue to assess barriers to discharge/monitor patient progress toward functional and medical goals.  Function:  Bathing Bathing position   Position: Wheelchair/chair at sink  Bathing parts Body parts bathed by patient: Right arm, Left arm, Chest, Abdomen, Buttocks, Right upper leg, Left upper leg, Right lower leg, Front perineal area Body parts bathed by helper: Back  Bathing assist Assist Level: Supervision or verbal cues      Upper Body Dressing/Undressing Upper body dressing   What is the patient wearing?: Pull over shirt/dress     Pull over shirt/dress - Perfomed by patient: Thread/unthread right sleeve, Thread/unthread left sleeve, Put head through opening, Pull shirt over trunk          Upper body assist Assist Level: More than reasonable time   Set up : To obtain clothing/put away  Lower Body Dressing/Undressing Lower body dressing   What is the patient wearing?: Pants, Non-skid slipper socks, Underwear Underwear - Performed by patient: Thread/unthread right underwear leg, Thread/unthread left underwear leg, Pull underwear up/down Underwear - Performed by helper: Pull underwear up/down Pants- Performed by patient: Thread/unthread right pants leg, Thread/unthread left pants leg, Pull pants up/down Pants- Performed by helper: Thread/unthread right pants leg, Thread/unthread left pants leg, Pull pants up/down Non-skid slipper socks- Performed by  patient: Don/doff right sock Non-skid slipper socks- Performed by helper: Don/doff right sock                  Lower body assist Assist for lower body dressing: Supervision or verbal cues      Toileting Toileting Toileting activity did not occur:  (does not void, no bm today) Toileting steps completed by patient: Adjust clothing prior to toileting, Performs perineal hygiene, Adjust clothing after  toileting Toileting steps completed by helper: Adjust clothing after toileting Toileting Assistive Devices: Grab bar or rail  Toileting assist Assist level: Supervision or verbal cues   Transfers Chair/bed transfer   Chair/bed transfer method: Stand pivot, Squat pivot Chair/bed transfer assist level: Supervision or verbal cues Chair/bed transfer assistive device: Armrests, Medical sales representative     Max distance: 54 ft Assist level: Supervision or verbal cues   Wheelchair   Type: Manual Max wheelchair distance: 150 ft Assist Level: Supervision or verbal cues  Cognition Comprehension Comprehension assist level: Follows basic conversation/direction with no assist  Expression Expression assist level: Expresses basic needs/ideas: With no assist  Social Interaction Social Interaction assist level: Interacts appropriately with others with medication or extra time (anti-anxiety, antidepressant).  Problem Solving Problem solving assist level: Solves basic 90% of the time/requires cueing < 10% of the time  Memory Memory assist level: Recognizes or recalls 75 - 89% of the time/requires cueing 10 - 24% of the time    Medical Problem List and Plan: 1.  Gait abnormality, difficulty with transfers, limitation with self-care secondary to left BKA.  Cont CIR, plan for d/c today, however, given INR, will hold admission.  Anticipate discharge in 1-2 days.  2.  DVT Prophylaxis/Anticoagulation: Pharmaceutical:  Coumadin  INR subtherapeutic.  Will start Heparin ggt until therapeutic.  3. Pain Management:L phantom limb pain D/Ced Gabapentin, increased  Lyrica to 75mg . Used dilaudid at home for CLBP.  Monitor with increased activity  Lidocaine ointment for pressure sores  Suspect chronic pain/behavioral component.  Neuropsych recommended  -added robaxin for muscle spasms.   Educated on desensitization techniques.  4. Mood: Improving with decrease in pain. LCSW to follow for evaluation and  support.  5. Neuropsych: This patient is capable of making decisions on her own behalf. 6. Skin/Wound Care: Routine wound care. Stump healing-Appreciate VVS note  -pressure sore Right heel 7. Fluids/Electrolytes/Nutrition: Strict I/O. Serial labs with HD to monitor lytes 8. ESRD: Monitor weight daily with strict I/O. Schedule HD late afternoons to help with tolerance of activity during the day.  9. CAD s/p CABG with AVR: Cont meds 10. 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 T2DM: Monitor BS ac/hs. Po intake poor--used 2 units levemir daily prn BS > 150. Will use SSI for elevated BS until intake is consistent.   Overall controlled 4/5 12. HTN: Monitor bid.   Labile at times, monitor in accordance with HD per nephro, patient has terminated HD AMA at times  Relatively controlled 4/5 13. H/o Migraines: Managed with prn medications.  14. Anemia of chronic disease: Aranesp weekly with transfusion prn.   Hb 11.2 on 4/5  Improved- Aranesp per nephro 15. COPD: Respiratory status stable.  16. Leucocytosis:.   WBCs 13.4 on 4/5 (stable)    afebrile 17.  PVD Right toes cool and dusky but no rest pain 18.  Nausea Improved- renal and hepatic failure, no diarrhea, trial reglan  LOS (Days) 15 A FACE TO FACE EVALUATION WAS PERFORMED  Ankit  [name] 05/15/2016 9:04 AM

## 2016-05-15 NOTE — Progress Notes (Signed)
Drop in INR likely due to improvement in intake.  Will need IV heparin for bridge due to AVR. Discussed with PharmD, patient and family. Discharge on hold pending rise in INR to therapeutic level.

## 2016-05-15 NOTE — Progress Notes (Signed)
Recreational Therapy Discharge Summary Patient Details  Name: [name] MRN: [id number] Date of Birth: October 15, 1955 Today's Date: 05/15/2016 Comments on progress toward goals: Pt has made good progress toward goals and is ready for discharge home today with family to provide 24 hours supervision/assistance. Pt educated on activity analysis with potential modifications & energy conservation strategies.     Reasons for discharge: discharge from hospital  Patient/family agrees with progress made and goals achieved: Yes  Ceniyah Thorp 05/15/2016, 8:15 AM

## 2016-05-15 NOTE — Progress Notes (Signed)
Social Work Patient ID: [name], female   DOB: 1955-05-03, 61 y.o.   MRN: [id number]  Spoke with Pam-PA who reports pt is not discharging today due to medical issues. Pt and daughter aware and will await medical clearance.

## 2016-05-16 DIAGNOSIS — Z952 Presence of prosthetic heart valve: Secondary | ICD-10-CM

## 2016-05-16 LAB — RENAL FUNCTION PANEL
Albumin: 1.7 g/dL — ABNORMAL LOW (ref 3.5–5.0)
Anion gap: 10 (ref 5–15)
BUN: 21 mg/dL — ABNORMAL HIGH (ref 6–20)
CHLORIDE: 99 mmol/L — AB (ref 101–111)
CO2: 24 mmol/L (ref 22–32)
Calcium: 8.5 mg/dL — ABNORMAL LOW (ref 8.9–10.3)
Creatinine, Ser: 5.52 mg/dL — ABNORMAL HIGH (ref 0.44–1.00)
GFR, EST AFRICAN AMERICAN: 9 mL/min — AB (ref >60)
GFR, EST NON AFRICAN AMERICAN: 8 mL/min — AB (ref >60)
Glucose, Bld: 217 mg/dL — ABNORMAL HIGH (ref 65–99)
Phosphorus: 4.3 mg/dL (ref 2.5–4.6)
Potassium: 4.6 mmol/L (ref 3.5–5.1)
Sodium: 133 mmol/L — ABNORMAL LOW (ref 135–145)

## 2016-05-16 LAB — HEPARIN LEVEL (UNFRACTIONATED)
HEPARIN UNFRACTIONATED: 0.87 [IU]/mL — AB (ref 0.30–0.70)
Heparin Unfractionated: 0.81 IU/mL — ABNORMAL HIGH (ref 0.30–0.70)

## 2016-05-16 LAB — CBC
HCT: 31.5 % — ABNORMAL LOW (ref 36.0–46.0)
Hemoglobin: 10.5 g/dL — ABNORMAL LOW (ref 12.0–15.0)
MCH: 29.2 pg (ref 26.0–34.0)
MCHC: 33.3 g/dL (ref 30.0–36.0)
MCV: 87.5 fL (ref 78.0–100.0)
PLATELETS: 207 10*3/uL (ref 150–400)
RBC: 3.6 MIL/uL — ABNORMAL LOW (ref 3.87–5.11)
RDW: 17.7 % — ABNORMAL HIGH (ref 11.5–15.5)
WBC: 13.9 10*3/uL — ABNORMAL HIGH (ref 4.0–10.5)

## 2016-05-16 LAB — GLUCOSE, CAPILLARY
GLUCOSE-CAPILLARY: 81 mg/dL (ref 65–99)
Glucose-Capillary: 109 mg/dL — ABNORMAL HIGH (ref 65–99)
Glucose-Capillary: 154 mg/dL — ABNORMAL HIGH (ref 65–99)
Glucose-Capillary: 76 mg/dL (ref 65–99)

## 2016-05-16 LAB — PROTIME-INR
INR: 1.87
Prothrombin Time: 21.8 seconds — ABNORMAL HIGH (ref 11.4–15.2)

## 2016-05-16 MED ORDER — WARFARIN SODIUM 2.5 MG PO TABS
2.5000 mg | ORAL_TABLET | Freq: Once | ORAL | Status: AC
  Administered 2016-05-16: 2.5 mg via ORAL
  Filled 2016-05-16: qty 1

## 2016-05-16 MED ORDER — CALCITRIOL 0.5 MCG PO CAPS
ORAL_CAPSULE | ORAL | Status: AC
  Filled 2016-05-16: qty 1

## 2016-05-16 MED ORDER — LIDOCAINE-PRILOCAINE 2.5-2.5 % EX CREA: 1.0000 "application " | TOPICAL_CREAM | CUTANEOUS | Status: DC | PRN

## 2016-05-16 MED ORDER — HYDROMORPHONE HCL 2 MG PO TABS
ORAL_TABLET | ORAL | Status: AC
  Filled 2016-05-16: qty 1

## 2016-05-16 MED ORDER — SODIUM CHLORIDE 0.9 % IV SOLN: 100.0000 mL | INTRAVENOUS | Status: DC | PRN

## 2016-05-16 MED ORDER — LIDOCAINE HCL (PF) 1 % IJ SOLN: 5.0000 mL | INTRAMUSCULAR | Status: DC | PRN

## 2016-05-16 MED ORDER — PENTAFLUOROPROP-TETRAFLUOROETH EX AERO: 1.0000 "application " | INHALATION_SPRAY | CUTANEOUS | Status: DC | PRN

## 2016-05-16 MED ORDER — ALTEPLASE 2 MG IJ SOLR: 2.0000 mg | Freq: Once | INTRAMUSCULAR | Status: DC | PRN

## 2016-05-16 NOTE — Progress Notes (Signed)
ANTICOAGULATION CONSULT NOTE - Follow Up Consult  Pharmacy Consult for heparin and coumadin Indication: AVR  Allergies  Allergen Reactions  . Acetaminophen Other (See Comments)    Liver transplant recipient   . Morphine Itching and Other (See Comments)    hallucinate  . Codeine Itching  . Mirtazapine Other (See Comments)    hallucination    Patient Measurements: Height: 5\' 4"  (162.6 cm) Weight: 95 lb 3.8 oz (43.2 kg) IBW/kg (Calculated) : 54.7 Heparin Dosing Weight: 43.3 kg  Vital Signs: Temp: 98.3 F (36.8 C) (04/06 1833) Temp Source: Oral (04/06 1833) BP: 150/57 (04/06 2137) Pulse Rate: 79 (04/06 2137)  Labs:  Recent Labs  05/14/16 5597 05/15/16 0551 05/15/16 1833 05/16/16 0638 05/16/16 1257 05/16/16 2048  HGB 12.0 11.2*  --  10.5*  --   --   HCT 36.7 33.9*  --  31.5*  --   --   PLT 219 224  --  207  --   --   LABPROT 27.6* 21.9*  --  21.8*  --   --   INR 2.51 1.89  --  1.87  --   --   HEPARINUNFRC  --   --  0.55 0.81*  --  0.87*  CREATININE  --   --   --   --  5.52*  --     Estimated Creatinine Clearance: 7.3 mL/min (A) (by C-G formula based on SCr of 5.52 mg/dL (H)).   Medications:  Scheduled:  . aspirin EC  81 mg Oral Daily  . calcitRIOL  0.5 mcg Oral Q M,W,F-HD  . collagenase   Topical Daily  . feeding supplement  1 Container Oral TID BM  . feeding supplement (PRO-STAT SUGAR FREE 64)  30 mL Oral BID  . hydrocortisone  25 mg Rectal BID  . HYDROmorphone  2 mg Oral Once  . insulin aspart  0-5 Units Subcutaneous QHS  . insulin aspart  0-9 Units Subcutaneous TID WC  . levothyroxine  175 mcg Oral QAC breakfast  . metoCLOPramide  5 mg Oral BID AC  . metoprolol tartrate  25 mg Oral BID  . multivitamin  1 tablet Oral QHS  . pantoprazole  40 mg Oral Daily  . polyethylene glycol  17 g Oral BID  . pregabalin  75 mg Oral Daily  . tacrolimus  2 mg Oral BID  . Warfarin - Pharmacist Dosing Inpatient   Does not apply q1800  . zinc sulfate  220 mg Oral Daily    Infusions:  . heparin 1,300 Units/hr (05/16/16 2134)    Assessment: 61 yo female with AVR is currently on subtherapeutic coumadin. INR is down to 1.87 - received warfarin 2.5mg  dose tonight. Continuing on heparin bridge.  Heparin level remains supratherapeutic (0.87) after rate decrease to 1300 units/h. CBC stable. No bleed or IV line issues per RN.  Goal of Therapy:  Heparin level 0.3-0.7 units/ml; INR 2.5-3.5 Monitor platelets by anticoagulation protocol: Yes   Plan:  - Decrease heparin to 1200 units/hr - 8 hr heparin level - Daily INR, heparin level, and CBC - Monitor S/Sx bleeding   Elicia Lamp, PharmD, BCPS Clinical Pharmacist 05/16/2016 9:59 PM

## 2016-05-16 NOTE — Progress Notes (Signed)
[name] Progress Note   Dialysis Orders: [name] MWF 3h 95min 2/2 bath 58kg Hep none R IJ cath, calcitriol 0.5   Assessment/Plan: 1. 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 ESRD - MWF - for HD today  Check renal panel today 3. Anemia - hgb 10.5 follow not on esa  4. 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 HTN/volume - 14kg under prior dry wt.  NO vol excess on exam 6. Severe malnutrition - alb 1.7 - sig weight loss on reg diet protein supp   7. Hx liver Tx - on prograf 8. Hx AVR on Coumadin  INR subtherapeutic today - heparin dosing per pharmacy  9. Leukocytosis - improving 10. Sacral decub - stage 2 per RN - poor nutrition not helping  [name] Child PA-C [name] Pager [phone] 05/16/2016,1:38 PM  Pt seen, examined and agree w A/P as above.  [name] MD [name] pager [phone]   05/16/2016, 4:56 PM     Subjective:   Sitting up in wheelchair. Disappointed can't go home yet. No new c/os   Objective Vitals:   05/15/16 1436 05/15/16 2223 05/16/16 0500 05/16/16 0539  BP: (!) 140/56 106/63  (!) 151/60  Pulse: 76 83  80  Resp: 17   17  Temp: 98.8 F (37.1 C)   98.6 F (37 C)  TempSrc: Oral   Oral  SpO2: 98%   100%  Weight:   43.9 kg (96 lb 12.5 oz)   Height:       Physical Exam General: alert and no distress Heart: RRR Lungs: no rales dim BS but very poor effort Abdomen: soft  Extremities: right BKA tender to touch left heel boggy Dialysis Access: right IJ   Additional Objective Labs: Basic Metabolic Panel:  Recent Labs Lab 05/09/16 1855 05/12/16 1801  NA 134* 134*  K 4.9 3.7  CL 96* 97*  CO2 25 25  GLUCOSE 110* 118*  BUN 30* 38*  CREATININE 5.69* 7.29*  CALCIUM 8.5* 8.0*  PHOS 4.9* 4.0   Liver Function Tests:  Recent Labs Lab 05/09/16 1855 05/12/16 1801  ALBUMIN 1.7* 1.6*  CBC:  Recent Labs Lab  05/12/16 1813 05/13/16 0641 05/14/16 0649 05/15/16 0551 05/16/16 0638  WBC 13.5* 10.5 11.1* 13.4* 13.9*  HGB 12.1 12.0 12.0 11.2* 10.5*  HCT 36.2 35.1* 36.7 33.9* 31.5*  MCV 88.5 87.8 88.0 88.1 87.5  PLT 263 196 219 224 207   CBG:  Recent Labs Lab 05/15/16 1133 05/15/16 1641 05/15/16 2045 05/16/16 0640 05/16/16 1152  GLUCAP 132* 214* 163* 109* 154*    Lab Results  Component Value Date   INR 1.87 05/16/2016   INR 1.89 05/15/2016   INR 2.51 05/14/2016   Studies/Results: No results found. Medications: . heparin 1,300 Units/hr (05/16/16 0843)   . aspirin EC  81 mg Oral Daily  . calcitRIOL  0.5 mcg Oral Q M,W,F-HD  . collagenase   Topical Daily  . feeding supplement  1 Container Oral TID BM  . feeding supplement (PRO-STAT SUGAR FREE 64)  30 mL Oral BID  . hydrocortisone  25 mg Rectal BID  . HYDROmorphone  2 mg Oral Once  . insulin aspart  0-5 Units Subcutaneous QHS  . insulin aspart  0-9 Units Subcutaneous TID WC  . levothyroxine  175 mcg Oral QAC breakfast  . metoCLOPramide  5 mg Oral BID AC  . metoprolol  tartrate  25 mg Oral BID  . multivitamin  1 tablet Oral QHS  . pantoprazole  40 mg Oral Daily  . polyethylene glycol  17 g Oral BID  . pregabalin  75 mg Oral Daily  . tacrolimus  2 mg Oral BID  . warfarin  2.5 mg Oral ONCE-1800  . Warfarin - Pharmacist Dosing Inpatient   Does not apply q1800  . zinc sulfate  220 mg Oral Daily

## 2016-05-16 NOTE — Progress Notes (Signed)
[facility] PHYSICAL MEDICINE & REHABILITATION     PROGRESS NOTE  Subjective/Complaints: Pt seen laying in bed this AM.  She slept well.  She would like to go home, but recognizes it would not be safe for her at this point.   ROS: Denies CP, SOB, N/V/D.  Objective: Vital Signs: Blood pressure (!) 151/60, pulse 80, temperature 98.6 F (37 C), temperature source Oral, resp. rate 17, height 5\' 4"  (1.626 m), weight 43.9 kg (96 lb 12.5 oz), SpO2 100 %. No results found.  Recent Labs  05/15/16 0551 05/16/16 0638  WBC 13.4* 13.9*  HGB 11.2* 10.5*  HCT 33.9* 31.5*  PLT 224 207   No results for input(s): NA, K, CL, GLUCOSE, BUN, CREATININE, CALCIUM in the last 72 hours.  Invalid input(s): CO CBG (last 3)   Recent Labs  05/15/16 1641 05/15/16 2045 05/16/16 0640  GLUCAP 214* 163* 109*    Wt Readings from Last 3 Encounters:  05/16/16 43.9 kg (96 lb 12.5 oz)  04/30/16 54.7 kg (120 lb 9.5 oz)  04/17/16 58.7 kg (129 lb 6.4 oz)    Physical Exam:  BP (!) 151/60 (BP Location: Left Arm)   Pulse 80   Temp 98.6 F (37 C) (Oral)   Resp 17   Ht 5\' 4"  (1.626 m)   Wt 43.9 kg (96 lb 12.5 oz)   SpO2 100%   BMI 16.61 kg/m  Constitutional: She appears well-developed and well-nourished. NAD. HENT: Normocephalic and atraumatic. Edentulous   Eyes: EOMI. No discharge.  Cardiovascular: RRR. No JVD. Respiratory: Normal effort. Clear GI: Soft. Bowel sounds are normal.  Musculoskeletal: She exhibits edema and tenderness.  Neurological: She is alert and oriented.  Motor: B/l UE 4/5 RLE: HF 3+/5, KE 3+/5, ADF/PF 4/5  LLE: HF 4/5 (stable) Skin: Skin is warm and dry. Dusky extremities  Right toes dusky,with boggy heel.   Stump with staples c/d/i Psychiatric: Normal mood and affect  Assessment/Plan: 1. Functional deficits secondary to left BKA which require 3+ hours per day of interdisciplinary therapy in a comprehensive inpatient rehab setting. Physiatrist is providing close team  supervision and 24 hour management of active medical problems listed below. Physiatrist and rehab team continue to assess barriers to discharge/monitor patient progress toward functional and medical goals.  Function:  Bathing Bathing position   Position: Wheelchair/chair at sink  Bathing parts Body parts bathed by patient: Right arm, Left arm, Chest, Abdomen, Buttocks, Right upper leg, Left upper leg, Right lower leg, Front perineal area Body parts bathed by helper: Back  Bathing assist Assist Level: Supervision or verbal cues      Upper Body Dressing/Undressing Upper body dressing   What is the patient wearing?: Pull over shirt/dress     Pull over shirt/dress - Perfomed by patient: Thread/unthread right sleeve, Thread/unthread left sleeve, Put head through opening, Pull shirt over trunk          Upper body assist Assist Level: More than reasonable time   Set up : To obtain clothing/put away  Lower Body Dressing/Undressing Lower body dressing   What is the patient wearing?: Pants, Non-skid slipper socks, Underwear Underwear - Performed by patient: Thread/unthread right underwear leg, Thread/unthread left underwear leg, Pull underwear up/down Underwear - Performed by helper: Pull underwear up/down Pants- Performed by patient: Thread/unthread right pants leg, Thread/unthread left pants leg, Pull pants up/down Pants- Performed by helper: Thread/unthread right pants leg, Thread/unthread left pants leg, Pull pants up/down Non-skid slipper socks- Performed by patient: Don/doff right sock Non-skid  slipper socks- Performed by helper: Don/doff right sock                  Lower body assist Assist for lower body dressing: Supervision or verbal cues      Toileting Toileting Toileting activity did not occur:  (does not void, no bm today) Toileting steps completed by patient: Adjust clothing prior to toileting, Performs perineal hygiene, Adjust clothing after toileting Toileting  steps completed by helper: Adjust clothing after toileting Toileting Assistive Devices: Grab bar or rail  Toileting assist Assist level: Supervision or verbal cues   Transfers Chair/bed transfer   Chair/bed transfer method: Stand pivot, Squat pivot Chair/bed transfer assist level: Supervision or verbal cues Chair/bed transfer assistive device: Armrests, Medical sales representative     Max distance: 54 ft Assist level: Supervision or verbal cues   Wheelchair   Type: Manual Max wheelchair distance: 150 ft Assist Level: Supervision or verbal cues  Cognition Comprehension Comprehension assist level: Follows basic conversation/direction with no assist  Expression Expression assist level: Expresses basic needs/ideas: With no assist  Social Interaction Social Interaction assist level: Interacts appropriately with others with medication or extra time (anti-anxiety, antidepressant).  Problem Solving Problem solving assist level: Solves basic 90% of the time/requires cueing < 10% of the time  Memory Memory assist level: Recognizes or recalls 75 - 89% of the time/requires cueing 10 - 24% of the time    Medical Problem List and Plan: 1.  Gait abnormality, difficulty with transfers, limitation with self-care secondary to left BKA.  Will cont to hold discharge due to subtherapeutic INR 2.  Anticoagulation (Mechanical heart valve):    Coumadin  INR remain ssubtherapeutic.  Cont Heparin ggt until INR therapeutic.  3. Pain Management: L phantom limb pain D/Ced Gabapentin, increased  Lyrica to 75mg . Used dilaudid at home for CLBP.  Monitor with increased activity  Lidocaine ointment for pressure sores  Suspect chronic pain/behavioral component.  Neuropsych recommended  -added robaxin for muscle spasms.   Educated on desensitization techniques.  4. Mood: Improving with decrease in pain. LCSW to follow for evaluation and support.  5. Neuropsych: This patient is capable of making decisions  on her own behalf. 6. Skin/Wound Care: Routine wound care. Stump healing-Appreciate VVS note  -pressure sore Right heel 7. Fluids/Electrolytes/Nutrition: Strict I/O. Serial labs with HD to monitor lytes 8. ESRD: Monitor weight daily with strict I/O. Schedule HD late afternoons to help with tolerance of activity during the day.  9. CAD s/p CABG with AVR: Cont meds 10. 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 T2DM: Monitor BS ac/hs. Po intake poor--used 2 units levemir daily prn BS > 150. Will use SSI for elevated BS until intake is consistent.   Overall controlled 4/6 12. HTN: Monitor bid.   Labile at times, monitor in accordance with HD per nephro, patient has terminated HD AMA at times  Relatively controlled 4/6 13. H/o Migraines: Managed with prn medications.  14. Anemia of chronic disease: Aranesp weekly with transfusion prn.   Hb 10.5 on 4/6  Improved- Aranesp per nephro 15. COPD: Respiratory status stable.  16. Leucocytosis:.   WBCs 13.9 on 4/6 (stable)    afebrile 17.  PVD Right toes cool and dusky but no rest pain 18.  Nausea Improved- renal and hepatic failure, no diarrhea, trial reglan  LOS (Days) 16 A FACE TO FACE EVALUATION WAS PERFORMED  [name] [name] 05/16/2016 9:28 AM

## 2016-05-17 LAB — GLUCOSE, CAPILLARY
GLUCOSE-CAPILLARY: 148 mg/dL — AB (ref 65–99)
GLUCOSE-CAPILLARY: 142 mg/dL — AB (ref 65–99)
Glucose-Capillary: 112 mg/dL — ABNORMAL HIGH (ref 65–99)

## 2016-05-17 LAB — CBC
HCT: 32.1 % — ABNORMAL LOW (ref 36.0–46.0)
Hemoglobin: 10.5 g/dL — ABNORMAL LOW (ref 12.0–15.0)
MCH: 28.7 pg (ref 26.0–34.0)
MCHC: 32.7 g/dL (ref 30.0–36.0)
MCV: 87.7 fL (ref 78.0–100.0)
PLATELETS: 230 10*3/uL (ref 150–400)
RBC: 3.66 MIL/uL — ABNORMAL LOW (ref 3.87–5.11)
RDW: 17.5 % — ABNORMAL HIGH (ref 11.5–15.5)
WBC: 12.3 10*3/uL — ABNORMAL HIGH (ref 4.0–10.5)

## 2016-05-17 LAB — HEPARIN LEVEL (UNFRACTIONATED)
Heparin Unfractionated: 0.73 IU/mL — ABNORMAL HIGH (ref 0.30–0.70)
Heparin Unfractionated: 0.59 IU/mL (ref 0.30–0.70)

## 2016-05-17 LAB — PROTIME-INR
INR: 2.04
PROTHROMBIN TIME: 23.4 s — AB (ref 11.4–15.2)

## 2016-05-17 MED ORDER — WARFARIN SODIUM 2.5 MG PO TABS
2.5000 mg | ORAL_TABLET | Freq: Once | ORAL | Status: AC
  Administered 2016-05-17: 2.5 mg via ORAL
  Filled 2016-05-17: qty 1

## 2016-05-17 NOTE — Progress Notes (Signed)
[facility] PHYSICAL MEDICINE & REHABILITATION     PROGRESS NOTE  Subjective/Complaints: Pt seen laying in bed this AM.  She would like to go home.   ROS: Denies CP, SOB, N/V/D.  Objective: Vital Signs: Blood pressure 123/79, pulse 81, temperature 98.7 F (37.1 C), temperature source Oral, resp. rate 16, height 5\' 4"  (1.626 m), weight 44.1 kg (97 lb 3.2 oz), SpO2 98 %. No results found.  Recent Labs  05/16/16 0638 05/17/16 0550  WBC 13.9* 12.3*  HGB 10.5* 10.5*  HCT 31.5* 32.1*  PLT 207 230    Recent Labs  05/16/16 1257  NA 133*  K 4.6  CL 99*  GLUCOSE 217*  BUN 21*  CREATININE 5.52*  CALCIUM 8.5*   CBG (last 3)   Recent Labs  05/16/16 1825 05/16/16 2059 05/17/16 0631  GLUCAP 81 76 112*    Wt Readings from Last 3 Encounters:  05/17/16 44.1 kg (97 lb 3.2 oz)  04/30/16 54.7 kg (120 lb 9.5 oz)  04/17/16 58.7 kg (129 lb 6.4 oz)    Physical Exam:  BP 123/79 (BP Location: Left Arm)   Pulse 81   Temp 98.7 F (37.1 C) (Oral)   Resp 16   Ht 5\' 4"  (1.626 m)   Wt 44.1 kg (97 lb 3.2 oz)   SpO2 98%   BMI 16.68 kg/m  Constitutional: She appears well-developed. Frail. NAD. HENT: Normocephalic and atraumatic. Edentulous   Eyes: EOMI. No discharge.  Cardiovascular: RRR. No JVD. Respiratory: Normal effort. Clear GI: Soft. Bowel sounds are normal.  Musculoskeletal: She exhibits edema and tenderness.  Neurological: She is alert and oriented.  Motor: B/l UE 4/5 RLE: HF 3+/5, KE 3+/5, ADF/PF 4/5  LLE: HF 4/5 (unchanged) Skin: Skin is warm and dry. Dusky extremities  Right toes dusky,with boggy heel.   Stump with staples c/d/i Psychiatric: Normal mood and affect  Assessment/Plan: 1. Functional deficits secondary to left BKA which require 3+ hours per day of interdisciplinary therapy in a comprehensive inpatient rehab setting. Physiatrist is providing close team supervision and 24 hour management of active medical problems listed below. Physiatrist and rehab  team continue to assess barriers to discharge/monitor patient progress toward functional and medical goals.  Function:  Bathing Bathing position   Position: Wheelchair/chair at sink  Bathing parts Body parts bathed by patient: Right arm, Left arm, Chest, Abdomen, Buttocks, Right upper leg, Left upper leg, Right lower leg, Front perineal area Body parts bathed by helper: Back  Bathing assist Assist Level: Supervision or verbal cues      Upper Body Dressing/Undressing Upper body dressing   What is the patient wearing?: Pull over shirt/dress     Pull over shirt/dress - Perfomed by patient: Thread/unthread right sleeve, Thread/unthread left sleeve, Put head through opening, Pull shirt over trunk          Upper body assist Assist Level: More than reasonable time   Set up : To obtain clothing/put away  Lower Body Dressing/Undressing Lower body dressing   What is the patient wearing?: Pants, Non-skid slipper socks, Underwear Underwear - Performed by patient: Thread/unthread right underwear leg, Thread/unthread left underwear leg, Pull underwear up/down Underwear - Performed by helper: Pull underwear up/down Pants- Performed by patient: Thread/unthread right pants leg, Thread/unthread left pants leg, Pull pants up/down Pants- Performed by helper: Thread/unthread right pants leg, Thread/unthread left pants leg, Pull pants up/down Non-skid slipper socks- Performed by patient: Don/doff right sock Non-skid slipper socks- Performed by helper: Don/doff right sock  Lower body assist Assist for lower body dressing: Supervision or verbal cues      Toileting Toileting Toileting activity did not occur:  (does not void, no bm today) Toileting steps completed by patient: Adjust clothing prior to toileting, Performs perineal hygiene, Adjust clothing after toileting Toileting steps completed by helper: Adjust clothing after toileting Toileting Assistive Devices: Grab bar or  rail  Toileting assist Assist level: Supervision or verbal cues   Transfers Chair/bed transfer   Chair/bed transfer method: Stand pivot, Squat pivot Chair/bed transfer assist level: Supervision or verbal cues Chair/bed transfer assistive device: Armrests, Medical sales representative     Max distance: 54 ft Assist level: Supervision or verbal cues   Wheelchair   Type: Manual Max wheelchair distance: 150 ft Assist Level: Supervision or verbal cues  Cognition Comprehension Comprehension assist level: Follows basic conversation/direction with no assist  Expression Expression assist level: Expresses basic needs/ideas: With no assist  Social Interaction Social Interaction assist level: Interacts appropriately with others with medication or extra time (anti-anxiety, antidepressant).  Problem Solving Problem solving assist level: Solves basic 90% of the time/requires cueing < 10% of the time  Memory Memory assist level: Recognizes or recalls 75 - 89% of the time/requires cueing 10 - 24% of the time    Medical Problem List and Plan: 1.  Gait abnormality, difficulty with transfers, limitation with self-care secondary to left BKA.  Will cont to hold discharge due to subtherapeutic INR 2.  Anticoagulation (Mechanical heart valve):     Coumadin  INR remain subtherapeutic.  Cont Heparin ggt until INR therapeutic. Spoke with Pharmacy again, cont to recommend therapeutic level prior to d/c as well. 3. Pain Management: L phantom limb pain D/Ced Gabapentin, increased  Lyrica to 75mg . Used dilaudid at home for CLBP.  Monitor with increased activity  Lidocaine ointment for pressure sores  Suspect chronic pain/behavioral component.  Neuropsych recommended  -added robaxin for muscle spasms.   Educated on desensitization techniques.  4. Mood: Improving with decrease in pain. LCSW to follow for evaluation and support.  5. Neuropsych: This patient is capable of making decisions on her own  behalf. 6. Skin/Wound Care: Routine wound care. Stump healing-Appreciate VVS note  -pressure sore Right heel 7. Fluids/Electrolytes/Nutrition: Strict I/O. Serial labs with HD to monitor lytes 8. ESRD: Monitor weight daily with strict I/O. Schedule HD late afternoons to help with tolerance of activity during the day.  9. CAD s/p CABG with AVR: Cont meds 10. 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 T2DM: Monitor BS ac/hs. Po intake poor--used 2 units levemir daily prn BS > 150. Will use SSI for elevated BS until intake is consistent.   Overall controlled 4/7 12. HTN: Monitor bid.   Labile at times, monitor in accordance with HD per nephro, patient has terminated HD AMA at times  Relatively controlled 4/7 13. H/o Migraines: Managed with prn medications.  14. Anemia of chronic disease: Aranesp weekly with transfusion prn.   Hb 10.5 on 4/7  Improved- Aranesp per nephro 15. COPD: Respiratory status stable.  16. Leucocytosis:.   WBCs 12.3 on 4/7  afebrile 17.  PVD Right toes cool and dusky but no rest pain 18.  Nausea Improved- renal and hepatic failure, no diarrhea, trial reglan  LOS (Days) 17 A FACE TO FACE EVALUATION WAS PERFORMED  [name] [name] 05/17/2016 7:52 AM

## 2016-05-17 NOTE — Progress Notes (Signed)
ANTICOAGULATION CONSULT NOTE - Follow Up Consult  Pharmacy Consult for heparin and coumadin Indication: AVR  Allergies  Allergen Reactions  . Acetaminophen Other (See Comments)    Liver transplant recipient   . Morphine Itching and Other (See Comments)    hallucinate  . Codeine Itching  . Mirtazapine Other (See Comments)    hallucination    Patient Measurements: Height: 5\' 4"  (162.6 cm) Weight: 97 lb 3.2 oz (44.1 kg) IBW/kg (Calculated) : 54.7 Heparin Dosing Weight: 43.3 kg  Vital Signs: Temp: 98.7 F (37.1 C) (04/07 0500) Temp Source: Oral (04/07 0500) BP: 123/79 (04/07 0500) Pulse Rate: 81 (04/07 0500)  Labs:  Recent Labs  05/14/16 0649 05/15/16 0551  05/16/16 0638 05/16/16 1257 05/16/16 2048 05/17/16 0550  HGB 12.0 11.2*  --  10.5*  --   --  10.5*  HCT 36.7 33.9*  --  31.5*  --   --  32.1*  PLT 219 224  --  207  --   --  230  LABPROT 27.6* 21.9*  --  21.8*  --   --   --   INR 2.51 1.89  --  1.87  --   --   --   HEPARINUNFRC  --   --   < > 0.81*  --  0.87* 0.73*  CREATININE  --   --   --   --  5.52*  --   --   < > = values in this interval not displayed.  Estimated Creatinine Clearance: 7.5 mL/min (A) (by C-G formula based on SCr of 5.52 mg/dL (H)).  Assessment: 61 yo female with AVR is currently on subtherapeutic coumadin and heparin bridge. Heparin level still slightly supratherapeutic (0.73) on gtt at 1200 units/hr.  Goal of Therapy:  Heparin level 0.3-0.7 units/ml; INR 2.5-3.5 Monitor platelets by anticoagulation protocol: Yes   Plan:  - Decrease heparin to 1100 units/hr - 8 hr heparin level  Sherlon Handing, PharmD, BCPS Clinical pharmacist, pager [phone] 05/17/2016 6:23 AM

## 2016-05-17 NOTE — Progress Notes (Signed)
ANTICOAGULATION CONSULT NOTE - Follow Up Consult  Pharmacy Consult for heparin and coumadin Indication: AVR  Allergies  Allergen Reactions  . Acetaminophen Other (See Comments)    Liver transplant recipient   . Morphine Itching and Other (See Comments)    hallucinate  . Codeine Itching  . Mirtazapine Other (See Comments)    hallucination    Patient Measurements: Height: 5\' 4"  (162.6 cm) Weight: 97 lb 3.2 oz (44.1 kg) IBW/kg (Calculated) : 54.7 Heparin Dosing Weight: 43.3 kg  Vital Signs: Temp: 98.3 F (36.8 C) (04/07 1338) Temp Source: Oral (04/07 1338) BP: 141/64 (04/07 1338) Pulse Rate: 41 (04/07 1338)  Labs:  Recent Labs  05/15/16 0551  05/16/16 7672 05/16/16 1257 05/16/16 2048 05/17/16 0550 05/17/16 1343  HGB 11.2*  --  10.5*  --   --  10.5*  --   HCT 33.9*  --  31.5*  --   --  32.1*  --   PLT 224  --  207  --   --  230  --   LABPROT 21.9*  --  21.8*  --   --  23.4*  --   INR 1.89  --  1.87  --   --  2.04  --   HEPARINUNFRC  --   < > 0.81*  --  0.87* 0.73* 0.59  CREATININE  --   --   --  5.52*  --   --   --   < > = values in this interval not displayed.  Estimated Creatinine Clearance: 7.5 mL/min (A) (by C-G formula based on SCr of 5.52 mg/dL (H)).  Assessment: 61 yo female with AVR is currently on subtherapeutic coumadin and heparin bridge. Heparin level therapeutic (0.59) on 1100 units/hr.  Goal of Therapy:  Heparin level 0.3-0.7 units/ml; INR 2.5-3.5 Monitor platelets by anticoagulation protocol: Yes   Plan:  - Continue heparin at 1100 units/hr - Warfarin 2.5 mg PO x1 tonight  - Daily INR, heparin level and CBC - Monitor for s/s bleeding   Argie Ramming, PharmD Pharmacy Resident  Pager [phone] 05/17/16 3:02 PM

## 2016-05-18 DIAGNOSIS — M79671 Pain in right foot: Secondary | ICD-10-CM

## 2016-05-18 LAB — CBC
HCT: 30.1 % — ABNORMAL LOW (ref 36.0–46.0)
HEMOGLOBIN: 9.9 g/dL — AB (ref 12.0–15.0)
MCH: 28.6 pg (ref 26.0–34.0)
MCHC: 32.9 g/dL (ref 30.0–36.0)
MCV: 87 fL (ref 78.0–100.0)
Platelets: 217 10*3/uL (ref 150–400)
RBC: 3.46 MIL/uL — ABNORMAL LOW (ref 3.87–5.11)
RDW: 17.7 % — ABNORMAL HIGH (ref 11.5–15.5)
WBC: 10.3 10*3/uL (ref 4.0–10.5)

## 2016-05-18 LAB — HEPARIN LEVEL (UNFRACTIONATED): Heparin Unfractionated: 0.49 IU/mL (ref 0.30–0.70)

## 2016-05-18 LAB — PROTIME-INR
INR: 2.15
Prothrombin Time: 24.3 seconds — ABNORMAL HIGH (ref 11.4–15.2)

## 2016-05-18 LAB — GLUCOSE, CAPILLARY
GLUCOSE-CAPILLARY: 105 mg/dL — AB (ref 65–99)
Glucose-Capillary: 156 mg/dL — ABNORMAL HIGH (ref 65–99)
Glucose-Capillary: 119 mg/dL — ABNORMAL HIGH (ref 65–99)
Glucose-Capillary: 215 mg/dL — ABNORMAL HIGH (ref 65–99)

## 2016-05-18 MED ORDER — WARFARIN SODIUM 2.5 MG PO TABS
2.5000 mg | ORAL_TABLET | Freq: Once | ORAL | Status: AC
  Administered 2016-05-18: 2.5 mg via ORAL
  Filled 2016-05-18: qty 1

## 2016-05-18 MED ORDER — OXYCODONE HCL 5 MG PO TABS
5.0000 mg | ORAL_TABLET | Freq: Two times a day (BID) | ORAL | Status: DC | PRN
  Administered 2016-05-18 – 2016-05-20 (×4): 5 mg via ORAL
  Filled 2016-05-18 (×4): qty 1

## 2016-05-18 NOTE — Progress Notes (Signed)
ANTICOAGULATION CONSULT NOTE - Follow Up Consult  Pharmacy Consult for heparin and coumadin Indication: AVR  Allergies  Allergen Reactions  . Acetaminophen Other (See Comments)    Liver transplant recipient   . Morphine Itching and Other (See Comments)    hallucinate  . Codeine Itching  . Mirtazapine Other (See Comments)    hallucination    Patient Measurements: Height: 5\' 4"  (162.6 cm) Weight: 100 lb 4.8 oz (45.5 kg) IBW/kg (Calculated) : 54.7 Heparin Dosing Weight: 43.3 kg  Vital Signs: Temp: 98.8 F (37.1 C) (04/08 0442) Temp Source: Oral (04/08 0442) BP: 105/38 (04/08 0442) Pulse Rate: 68 (04/08 0442)  Labs:  Recent Labs  05/16/16 0638 05/16/16 1257  05/17/16 0550 05/17/16 1343 05/18/16 0523  HGB 10.5*  --   --  10.5*  --  9.9*  HCT 31.5*  --   --  32.1*  --  30.1*  PLT 207  --   --  230  --  217  LABPROT 21.8*  --   --  23.4*  --  24.3*  INR 1.87  --   --  2.04  --  2.15  HEPARINUNFRC 0.81*  --   < > 0.73* 0.59 0.49  CREATININE  --  5.52*  --   --   --   --   < > = values in this interval not displayed.  Estimated Creatinine Clearance: 7.7 mL/min (A) (by C-G formula based on SCr of 5.52 mg/dL (H)).  Assessment: 61 yo female with AVR is currently on subtherapeutic coumadin (INR 2.15) and heparin bridge. Heparin level therapeutic (0.49) on 1100 units/hr.  Home Dose: coumadin 2mg  PO daily    Goal of Therapy:  Heparin level 0.3-0.7 units/ml; INR 2.5-3.5 Monitor platelets by anticoagulation protocol: Yes   Plan:  - Continue heparin at 1100 units/hr - Warfarin 2.5 mg PO x1 tonight  - Daily INR, heparin level and CBC - Monitor for s/s bleeding   Argie Ramming, PharmD Pharmacy Resident  Pager [phone] 05/18/16 6:58 AM

## 2016-05-18 NOTE — Progress Notes (Signed)
[facility] PHYSICAL MEDICINE & REHABILITATION     PROGRESS NOTE  Subjective/Complaints: Pt seen laying in bed this AM.  She had difficulty sleeping due to right foot pain.    ROS: +Right foot pain. Denies CP, SOB, N/V/D.  Objective: Vital Signs: Blood pressure (!) 120/58, pulse 60, temperature 98.8 F (37.1 C), temperature source Oral, resp. rate 16, height 5\' 4"  (1.626 m), weight 45.5 kg (100 lb 4.8 oz), SpO2 98 %. No results found.  Recent Labs  05/17/16 0550 05/18/16 0523  WBC 12.3* 10.3  HGB 10.5* 9.9*  HCT 32.1* 30.1*  PLT 230 217    Recent Labs  05/16/16 1257  NA 133*  K 4.6  CL 99*  GLUCOSE 217*  BUN 21*  CREATININE 5.52*  CALCIUM 8.5*   CBG (last 3)   Recent Labs  05/17/16 1143 05/17/16 1625 05/18/16 0632  GLUCAP 148* 142* 105*    Wt Readings from Last 3 Encounters:  05/18/16 45.5 kg (100 lb 4.8 oz)  04/30/16 54.7 kg (120 lb 9.5 oz)  04/17/16 58.7 kg (129 lb 6.4 oz)    Physical Exam:  BP (!) 120/58   Pulse 60   Temp 98.8 F (37.1 C) (Oral)   Resp 16   Ht 5\' 4"  (1.626 m)   Wt 45.5 kg (100 lb 4.8 oz)   SpO2 98%   BMI 17.22 kg/m  Constitutional: She appears well-developed. Frail. NAD. HENT: Normocephalic and atraumatic. Edentulous   Eyes: EOMI. No discharge.  Cardiovascular: RRR. No JVD. Respiratory: Normal effort. Clear GI: Soft. Bowel sounds are normal.  Musculoskeletal: She exhibits edema and tenderness.  Neurological: She is alert and oriented.  Motor: B/l UE 4/5 RLE: HF 3+/5, KE 3+/5, ADF/PF 4/5  LLE: HF 4/5 (unchanged) Skin: Skin is warm and dry. Dusky extremities  Right toes dusky,with boggy heel.   Stump with staples c/d/i Psychiatric: Normal mood and affect  Assessment/Plan: 1. Functional deficits secondary to left BKA which require 3+ hours per day of interdisciplinary therapy in a comprehensive inpatient rehab setting. Physiatrist is providing close team supervision and 24 hour management of active medical problems listed  below. Physiatrist and rehab team continue to assess barriers to discharge/monitor patient progress toward functional and medical goals.  Function:  Bathing Bathing position   Position: Wheelchair/chair at sink  Bathing parts Body parts bathed by patient: Right arm, Left arm, Chest, Abdomen, Buttocks, Right upper leg, Left upper leg, Right lower leg, Front perineal area Body parts bathed by helper: Back  Bathing assist Assist Level: Supervision or verbal cues      Upper Body Dressing/Undressing Upper body dressing   What is the patient wearing?: Pull over shirt/dress     Pull over shirt/dress - Perfomed by patient: Thread/unthread right sleeve, Thread/unthread left sleeve, Put head through opening, Pull shirt over trunk          Upper body assist Assist Level: More than reasonable time   Set up : To obtain clothing/put away  Lower Body Dressing/Undressing Lower body dressing   What is the patient wearing?: Pants, Non-skid slipper socks, Underwear Underwear - Performed by patient: Thread/unthread right underwear leg, Thread/unthread left underwear leg, Pull underwear up/down Underwear - Performed by helper: Pull underwear up/down Pants- Performed by patient: Thread/unthread right pants leg, Thread/unthread left pants leg, Pull pants up/down Pants- Performed by helper: Thread/unthread right pants leg, Thread/unthread left pants leg, Pull pants up/down Non-skid slipper socks- Performed by patient: Don/doff right sock Non-skid slipper socks- Performed by helper:  Don/doff right sock                  Lower body assist Assist for lower body dressing: Supervision or verbal cues      Toileting Toileting Toileting activity did not occur: No continent bowel/bladder event Toileting steps completed by patient: Adjust clothing prior to toileting, Performs perineal hygiene, Adjust clothing after toileting Toileting steps completed by helper: Adjust clothing after  toileting Toileting Assistive Devices: Grab bar or rail  Toileting assist Assist level: Supervision or verbal cues   Transfers Chair/bed transfer   Chair/bed transfer method: Stand pivot, Squat pivot Chair/bed transfer assist level: Supervision or verbal cues Chair/bed transfer assistive device: Armrests, Medical sales representative     Max distance: 54 ft Assist level: Supervision or verbal cues   Wheelchair   Type: Manual Max wheelchair distance: 150 ft Assist Level: Supervision or verbal cues  Cognition Comprehension Comprehension assist level: Follows basic conversation/direction with no assist  Expression Expression assist level: Expresses basic needs/ideas: With no assist  Social Interaction Social Interaction assist level: Interacts appropriately with others with medication or extra time (anti-anxiety, antidepressant).  Problem Solving Problem solving assist level: Solves basic 90% of the time/requires cueing < 10% of the time  Memory Memory assist level: Recognizes or recalls 75 - 89% of the time/requires cueing 10 - 24% of the time    Medical Problem List and Plan: 1.  Gait abnormality, difficulty with transfers, limitation with self-care secondary to left BKA.  Will cont to hold discharge due to subtherapeutic INR 2.  Anticoagulation (Mechanical heart valve):     Coumadin  INR remain subtherapeutic.  Cont Heparin ggt until INR therapeutic. Spoke with Pharmacy again, cont to recommend therapeutic level prior to d/c as well. 3. Pain Management: L phantom limb pain D/Ced Gabapentin, increased  Lyrica to 75mg . Used dilaudid at home for CLBP.  Monitor with increased activity  Lidocaine ointment for pressure sores  Suspect chronic pain/behavioral component.  Neuropsych recommended  -added robaxin for muscle spasms.   Educated on desensitization techniques.   Roxi 5 BID PRN added 4/8 4. Mood: Improving with decrease in pain. LCSW to follow for evaluation and support.   5. Neuropsych: This patient is capable of making decisions on her own behalf. 6. Skin/Wound Care: Routine wound care. Stump healing-Appreciate VVS note  -pressure sore Right heel 7. Fluids/Electrolytes/Nutrition: Strict I/O. Serial labs with HD to monitor lytes 8. ESRD: Monitor weight daily with strict I/O. Schedule HD late afternoons to help with tolerance of activity during the day.  9. CAD s/p CABG with AVR: Cont meds 10. 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 T2DM: Monitor BS ac/hs. Po intake poor--used 2 units levemir daily prn BS > 150. Will use SSI for elevated BS until intake is consistent.   Overall controlled 4/8 12. HTN: Monitor bid.   Labile at times, monitor in accordance with HD per nephro, patient has terminated HD AMA at times  Relatively controlled 4/8 13. H/o Migraines: Managed with prn medications.  14. Anemia of chronic disease: Aranesp weekly with transfusion prn.   Hb 9.9 on 4/8  Improved- Aranesp per nephro 15. COPD: Respiratory status stable.  16. Leucocytosis:.   WBCs 10.3 on 4/8  afebrile 17.  PVD Right toes cool and dusky with pain  Will consider Vasc consult tomorrow 18.  Nausea Improved- renal and hepatic failure, no diarrhea, trial reglan  LOS (Days) 18 A FACE TO FACE EVALUATION  WAS PERFORMED  Shloime Keilman [name] 05/18/2016 8:32 AM

## 2016-05-18 NOTE — Plan of Care (Signed)
Problem: RH PAIN MANAGEMENT Goal: RH STG PAIN MANAGED AT OR BELOW [name] PAIN GOAL Less than 5  Outcome: Not Progressing Continuously rates pain > 5

## 2016-05-19 ENCOUNTER — Encounter: Payer: Self-pay | Admitting: Vascular Surgery

## 2016-05-19 LAB — RENAL FUNCTION PANEL
Albumin: 1.6 g/dL — ABNORMAL LOW (ref 3.5–5.0)
Anion gap: 9 (ref 5–15)
BUN: 30 mg/dL — AB (ref 6–20)
CALCIUM: 8.3 mg/dL — AB (ref 8.9–10.3)
CHLORIDE: 100 mmol/L — AB (ref 101–111)
CO2: 25 mmol/L (ref 22–32)
CREATININE: 7.08 mg/dL — AB (ref 0.44–1.00)
GFR calc non Af Amer: 6 mL/min — ABNORMAL LOW (ref >60)
GFR, EST AFRICAN AMERICAN: 6 mL/min — AB (ref >60)
Glucose, Bld: 202 mg/dL — ABNORMAL HIGH (ref 65–99)
Phosphorus: 5.5 mg/dL — ABNORMAL HIGH (ref 2.5–4.6)
Potassium: 3.8 mmol/L (ref 3.5–5.1)
SODIUM: 134 mmol/L — AB (ref 135–145)

## 2016-05-19 LAB — GLUCOSE, CAPILLARY
GLUCOSE-CAPILLARY: 99 mg/dL (ref 65–99)
GLUCOSE-CAPILLARY: 152 mg/dL — AB (ref 65–99)
GLUCOSE-CAPILLARY: 103 mg/dL — AB (ref 65–99)
GLUCOSE-CAPILLARY: 170 mg/dL — AB (ref 65–99)
Glucose-Capillary: 172 mg/dL — ABNORMAL HIGH (ref 65–99)

## 2016-05-19 LAB — HEPARIN LEVEL (UNFRACTIONATED): Heparin Unfractionated: 0.46 IU/mL (ref 0.30–0.70)

## 2016-05-19 LAB — CBC
HEMATOCRIT: 31.6 % — AB (ref 36.0–46.0)
HEMOGLOBIN: 10.6 g/dL — AB (ref 12.0–15.0)
MCH: 29.2 pg (ref 26.0–34.0)
MCHC: 33.5 g/dL (ref 30.0–36.0)
MCV: 87.1 fL (ref 78.0–100.0)
Platelets: 223 10*3/uL (ref 150–400)
RBC: 3.63 MIL/uL — ABNORMAL LOW (ref 3.87–5.11)
RDW: 17.9 % — ABNORMAL HIGH (ref 11.5–15.5)
WBC: 10.7 10*3/uL — ABNORMAL HIGH (ref 4.0–10.5)

## 2016-05-19 LAB — HEPATITIS B SURFACE ANTIGEN: Hepatitis B Surface Ag: NEGATIVE

## 2016-05-19 LAB — PROTIME-INR
INR: 2.69
Prothrombin Time: 29.2 seconds — ABNORMAL HIGH (ref 11.4–15.2)

## 2016-05-19 MED ORDER — LIDOCAINE HCL (PF) 1 % IJ SOLN: 5.0000 mL | INTRAMUSCULAR | Status: DC | PRN

## 2016-05-19 MED ORDER — SODIUM CHLORIDE 0.9 % IV SOLN: 100.0000 mL | INTRAVENOUS | Status: DC | PRN

## 2016-05-19 MED ORDER — HEPARIN SODIUM (PORCINE) 1000 UNIT/ML DIALYSIS: 1000.0000 [IU] | INTRAMUSCULAR | Status: DC | PRN

## 2016-05-19 MED ORDER — OXYCODONE HCL 5 MG PO TABS
ORAL_TABLET | ORAL | Status: AC
  Filled 2016-05-19: qty 1

## 2016-05-19 MED ORDER — PENTAFLUOROPROP-TETRAFLUOROETH EX AERO: 1.0000 "application " | INHALATION_SPRAY | CUTANEOUS | Status: DC | PRN

## 2016-05-19 MED ORDER — LIDOCAINE-PRILOCAINE 2.5-2.5 % EX CREA: 1.0000 "application " | TOPICAL_CREAM | CUTANEOUS | Status: DC | PRN

## 2016-05-19 MED ORDER — ALTEPLASE 2 MG IJ SOLR: 2.0000 mg | Freq: Once | INTRAMUSCULAR | Status: DC | PRN

## 2016-05-19 MED ORDER — WARFARIN SODIUM 2.5 MG PO TABS
2.5000 mg | ORAL_TABLET | Freq: Once | ORAL | Status: AC
  Administered 2016-05-19: 2.5 mg via ORAL
  Filled 2016-05-19: qty 1

## 2016-05-19 MED ORDER — TRAMADOL HCL 50 MG PO TABS
ORAL_TABLET | ORAL | Status: AC
  Filled 2016-05-19: qty 1

## 2016-05-19 NOTE — Progress Notes (Addendum)
VASCULAR SURGERY ASSESSMENT & PLAN:   POD 25 Left AKA. Staples out in 5 days.  Has rest pain right foot. I can follow this as an outpt. I reviewed her arteriogram from September 2017. On the right side, the right common femoral and deep femoral artery are patent. The right superficial femoral artery has a mild stenosis at the adductor canal. The popliteal artery and the right is patent. The anterior tibial artery is the dominant runoff vessel although a there are 3 tibial vessels patent down to the ankle. The peroneal and posterior tibial arteries are very diminutive. At that time circulation looked reasonable. It has been 7 months since her arteriogram.   INR = 2.7  I can follow her rest pain of the right foot as an outpatient once she is discharged.   SUBJECTIVE:   Complains of rest pain right foot.   PHYSICAL EXAM:   Vitals:   05/18/16 0803 05/18/16 1527 05/18/16 2022 05/19/16 0700  BP: (!) 120/58 132/64 (!) 155/60 (!) 139/55  Pulse: 60 71 75 78  Resp:  17  16  Temp:  99.1 F (37.3 C)  98.4 F (36.9 C)  TempSrc:  Oral  Oral  SpO2:    99%  Weight:    113 lb 12.1 oz (51.6 kg)  Height:       Palpable right femoral pulse Stump shrinker on left BKA.    LABS:   Lab Results  Component Value Date   WBC 10.7 (H) 05/19/2016   HGB 10.6 (L) 05/19/2016   HCT 31.6 (L) 05/19/2016   MCV 87.1 05/19/2016   PLT 223 05/19/2016   Lab Results  Component Value Date   CREATININE 5.52 (H) 05/16/2016   Lab Results  Component Value Date   INR 2.69 05/19/2016   CBG (last 3)   Recent Labs  05/18/16 1638 05/18/16 2101 05/19/16 0620  GLUCAP 119* 215* 99    PROBLEM LIST:    Active Problems:   S/P unilateral BKA (below knee amputation), left (HCC)   Unilateral complete BKA, left, initial encounter (HCC)   Chronic midline low back pain without sciatica   ESRD on dialysis ([place])   Type 2 diabetes mellitus with complication, with long-term current use of insulin (HCC)  Supratherapeutic INR   Anemia of chronic disease   Phantom limb pain (HCC)   Subtherapeutic international normalized ratio (INR)   H/O heart valve replacement with mechanical valve   CURRENT MEDS:   . aspirin EC  81 mg Oral Daily  . calcitRIOL  0.5 mcg Oral Q M,W,F-HD  . collagenase   Topical Daily  . feeding supplement  1 Container Oral TID BM  . feeding supplement (PRO-STAT SUGAR FREE 64)  30 mL Oral BID  . hydrocortisone  25 mg Rectal BID  . HYDROmorphone  2 mg Oral Once  . insulin aspart  0-5 Units Subcutaneous QHS  . insulin aspart  0-9 Units Subcutaneous TID WC  . levothyroxine  175 mcg Oral QAC breakfast  . metoCLOPramide  5 mg Oral BID AC  . metoprolol tartrate  25 mg Oral BID  . multivitamin  1 tablet Oral QHS  . pantoprazole  40 mg Oral Daily  . polyethylene glycol  17 g Oral BID  . pregabalin  75 mg Oral Daily  . tacrolimus  2 mg Oral BID  . Warfarin - Pharmacist Dosing Inpatient   Does not apply q1800  . zinc sulfate  220 mg Oral Daily    Gerald Stabs  Karleen Hampshire: [phone] Office: [phone] 05/19/2016

## 2016-05-19 NOTE — Plan of Care (Signed)
Problem: RH BOWEL ELIMINATION Goal: RH STG MANAGE BOWEL W/MEDICATION W/ASSISTANCE STG Manage Bowel with Medication with  [facility].  Outcome: Not Progressing No BM in 3 days.

## 2016-05-19 NOTE — Progress Notes (Signed)
Social Work Patient ID: [name], female   DOB: 03-May-1955, 61 y.o.   MRN: [id number]  Spoke with Pam-PA and Jeanna-RN who report pt to be discharged tomorrow. Currently in HD. Will let [facility] know of discharge tomorrow.

## 2016-05-19 NOTE — Progress Notes (Signed)
ANTICOAGULATION CONSULT NOTE - Follow Up Consult  Pharmacy Consult for heparin and coumadin Indication: AVR  Allergies  Allergen Reactions  . Acetaminophen Other (See Comments)    Liver transplant recipient   . Morphine Itching and Other (See Comments)    hallucinate  . Codeine Itching  . Mirtazapine Other (See Comments)    hallucination    Patient Measurements: Height: 5\' 4"  (162.6 cm) Weight: 113 lb 12.1 oz (51.6 kg) IBW/kg (Calculated) : 54.7 Heparin Dosing Weight: 43.3 kg  Vital Signs: Temp: 98.4 F (36.9 C) (04/09 0700) Temp Source: Oral (04/09 0700) BP: 139/55 (04/09 0700) Pulse Rate: 78 (04/09 0700)  Labs:  Recent Labs  05/16/16 1257  05/17/16 0550 05/17/16 1343 05/18/16 0523 05/19/16 0632  HGB  --   < > 10.5*  --  9.9* 10.6*  HCT  --   --  32.1*  --  30.1* 31.6*  PLT  --   --  230  --  217 223  LABPROT  --   --  23.4*  --  24.3* 29.2*  INR  --   --  2.04  --  2.15 2.69  HEPARINUNFRC  --   < > 0.73* 0.59 0.49 0.46  CREATININE 5.52*  --   --   --   --   --   < > = values in this interval not displayed.  Estimated Creatinine Clearance: 8.7 mL/min (A) (by C-G formula based on SCr of 5.52 mg/dL (H)).   Medications:  Scheduled:  . aspirin EC  81 mg Oral Daily  . calcitRIOL  0.5 mcg Oral Q M,W,F-HD  . collagenase   Topical Daily  . feeding supplement  1 Container Oral TID BM  . feeding supplement (PRO-STAT SUGAR FREE 64)  30 mL Oral BID  . hydrocortisone  25 mg Rectal BID  . HYDROmorphone  2 mg Oral Once  . insulin aspart  0-5 Units Subcutaneous QHS  . insulin aspart  0-9 Units Subcutaneous TID WC  . levothyroxine  175 mcg Oral QAC breakfast  . metoCLOPramide  5 mg Oral BID AC  . metoprolol tartrate  25 mg Oral BID  . multivitamin  1 tablet Oral QHS  . pantoprazole  40 mg Oral Daily  . polyethylene glycol  17 g Oral BID  . pregabalin  75 mg Oral Daily  . tacrolimus  2 mg Oral BID  . Warfarin - Pharmacist Dosing Inpatient   Does not apply q1800  .  zinc sulfate  220 mg Oral Daily   Infusions:  . heparin 1,100 Units/hr (05/18/16 1528)    Assessment: 61 yo female with AVR is currently on therapeutic coumadin bridging with therapeutic heparin. Heparin level is 0.46, INR up to 2.69 and Hgb/Plt are stable.  Goal of Therapy:  Heparin level 0.3-0.7 units/ml; INR 2.5-3.5 Monitor platelets by anticoagulation protocol: Yes   Plan:  - d/c heparin - coumadin 2.5 mg po x1 - INR in am if not discharged  Deegan Valentino, Tsz-Yin 05/19/2016,8:24 AM

## 2016-05-19 NOTE — Progress Notes (Signed)
S:She says she is going home today after HD O:BP (!) 139/55 (BP Location: Left Arm)   Pulse 78   Temp 98.4 F (36.9 C) (Oral)   Resp 16   Ht 5\' 4"  (1.626 m)   Wt 51.6 kg (113 lb 12.1 oz)   SpO2 99%   BMI 19.53 kg/m   Intake/Output Summary (Last 24 hours) at 05/19/16 1007 Last data filed at 05/19/16 0836  Gross per 24 hour  Intake              600 ml  Output                1 ml  Net              599 ml   Weight change: 6.104 kg (13 lb 7.3 oz) JQB:HALPF and alert CVS:RRR Resp:clear Abd:+ BS NTND Ext:Lt BKA  No edema on Rt NEURO:CNI Ox3 no asterixis Rt PC   . aspirin EC  81 mg Oral Daily  . calcitRIOL  0.5 mcg Oral Q M,W,F-HD  . collagenase   Topical Daily  . feeding supplement  1 Container Oral TID BM  . feeding supplement (PRO-STAT SUGAR FREE 64)  30 mL Oral BID  . hydrocortisone  25 mg Rectal BID  . HYDROmorphone  2 mg Oral Once  . insulin aspart  0-5 Units Subcutaneous QHS  . insulin aspart  0-9 Units Subcutaneous TID WC  . levothyroxine  175 mcg Oral QAC breakfast  . metoCLOPramide  5 mg Oral BID AC  . metoprolol tartrate  25 mg Oral BID  . multivitamin  1 tablet Oral QHS  . pantoprazole  40 mg Oral Daily  . polyethylene glycol  17 g Oral BID  . pregabalin  75 mg Oral Daily  . tacrolimus  2 mg Oral BID  . warfarin  2.5 mg Oral ONCE-1800  . Warfarin - Pharmacist Dosing Inpatient   Does not apply q1800  . zinc sulfate  220 mg Oral Daily   No results found. BMET    Component Value Date/Time   NA 133 (L) 05/16/2016 1257   K 4.6 05/16/2016 1257   CL 99 (L) 05/16/2016 1257   CO2 24 05/16/2016 1257   GLUCOSE 217 (H) 05/16/2016 1257   BUN 21 (H) 05/16/2016 1257   CREATININE 5.52 (H) 05/16/2016 1257   CREATININE 6.42 (H) 01/17/2016 1233   CALCIUM 8.5 (L) 05/16/2016 1257   GFRNONAA 8 (L) 05/16/2016 1257   GFRAA 9 (L) 05/16/2016 1257   CBC    Component Value Date/Time   WBC 10.7 (H) 05/19/2016 0632   RBC 3.63 (L) 05/19/2016 0632   HGB 10.6 (L) 05/19/2016  0632   HCT 31.6 (L) 05/19/2016 0632   PLT 223 05/19/2016 0632   MCV 87.1 05/19/2016 0632   MCH 29.2 05/19/2016 0632   MCHC 33.5 05/19/2016 0632   RDW 17.9 (H) 05/19/2016 0632   LYMPHSABS 3.3 04/21/2016 2157   MONOABS 1.6 (H) 04/21/2016 2157   EOSABS 0.2 04/21/2016 2157   BASOSABS 0.0 04/21/2016 2157     Assessment: 1. Sp Lt BKA 2. ESRD MWF [name] 3. Anemia 4. 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 Liver Tx 6. AVR on coumadin 7. Sacral decub  Plan: 1. Plan HD today.  Will confirm with nurse if she to be DC'd   Kaleem Sartwell T

## 2016-05-19 NOTE — Progress Notes (Signed)
[place] PHYSICAL MEDICINE & REHABILITATION     PROGRESS NOTE  Subjective/Complaints: Pt seen laying in bed this AM.  She slept well overnight. She wants to go home.  Will need to coordinate with HD and speak with vascular.   ROS: +Right foot pain. Denies CP, SOB, N/V/D.  Objective: Vital Signs: Blood pressure (!) 139/55, pulse 78, temperature 98.4 F (36.9 C), temperature source Oral, resp. rate 16, height 5\' 4"  (1.626 m), weight 51.6 kg (113 lb 12.1 oz), SpO2 99 %. No results found.  Recent Labs  05/18/16 0523 05/19/16 0632  WBC 10.3 10.7*  HGB 9.9* 10.6*  HCT 30.1* 31.6*  PLT 217 223    Recent Labs  05/16/16 1257  NA 133*  K 4.6  CL 99*  GLUCOSE 217*  BUN 21*  CREATININE 5.52*  CALCIUM 8.5*   CBG (last 3)   Recent Labs  05/18/16 1638 05/18/16 2101 05/19/16 0620  GLUCAP 119* 215* 99    Wt Readings from Last 3 Encounters:  05/19/16 51.6 kg (113 lb 12.1 oz)  04/30/16 54.7 kg (120 lb 9.5 oz)  04/17/16 58.7 kg (129 lb 6.4 oz)    Physical Exam:  BP (!) 139/55 (BP Location: Left Arm)   Pulse 78   Temp 98.4 F (36.9 C) (Oral)   Resp 16   Ht 5\' 4"  (1.626 m)   Wt 51.6 kg (113 lb 12.1 oz)   SpO2 99%   BMI 19.53 kg/m  Constitutional: She appears well-developed. Frail. NAD. HENT: Normocephalic and atraumatic. Edentulous   Eyes: EOMI. No discharge.  Cardiovascular: RRR. No JVD. Respiratory: Normal effort. Clear GI: Soft. Bowel sounds are normal.  Musculoskeletal: She exhibits edema and tenderness.  Neurological: She is alert and oriented.  Motor: B/l UE 4/5 RLE: HF 4-/5, KE 4-/5, ADF/PF 4/5 (pain inhibition) LLE: HF 4+/5  Skin: Skin is warm and dry. Dusky extremities  Right toes dusky,with boggy heel.   Stump with staples c/d/i Psychiatric: Normal mood and affect  Assessment/Plan: 1. Functional deficits secondary to left BKA which require 3+ hours per day of interdisciplinary therapy in a comprehensive inpatient rehab setting. Physiatrist is  providing close team supervision and 24 hour management of active medical problems listed below. Physiatrist and rehab team continue to assess barriers to discharge/monitor patient progress toward functional and medical goals.  Function:  Bathing Bathing position   Position: Wheelchair/chair at sink  Bathing parts Body parts bathed by patient: Right arm, Left arm, Chest, Abdomen, Buttocks, Right upper leg, Left upper leg, Right lower leg, Front perineal area Body parts bathed by helper: Back  Bathing assist Assist Level: Supervision or verbal cues      Upper Body Dressing/Undressing Upper body dressing   What is the patient wearing?: Pull over shirt/dress     Pull over shirt/dress - Perfomed by patient: Thread/unthread right sleeve, Thread/unthread left sleeve, Put head through opening, Pull shirt over trunk          Upper body assist Assist Level: More than reasonable time   Set up : To obtain clothing/put away  Lower Body Dressing/Undressing Lower body dressing   What is the patient wearing?: Pants, Non-skid slipper socks, Underwear Underwear - Performed by patient: Thread/unthread right underwear leg, Thread/unthread left underwear leg, Pull underwear up/down Underwear - Performed by helper: Pull underwear up/down Pants- Performed by patient: Thread/unthread right pants leg, Thread/unthread left pants leg, Pull pants up/down Pants- Performed by helper: Thread/unthread right pants leg, Thread/unthread left pants leg, Pull pants up/down  Non-skid slipper socks- Performed by patient: Don/doff right sock Non-skid slipper socks- Performed by helper: Don/doff right sock                  Lower body assist Assist for lower body dressing: Supervision or verbal cues      Toileting Toileting Toileting activity did not occur: No continent bowel/bladder event Toileting steps completed by patient: Adjust clothing prior to toileting, Performs perineal hygiene, Adjust clothing after  toileting Toileting steps completed by helper: Adjust clothing after toileting Toileting Assistive Devices: Grab bar or rail  Toileting assist Assist level: Supervision or verbal cues   Transfers Chair/bed transfer   Chair/bed transfer method: Stand pivot, Squat pivot Chair/bed transfer assist level: Supervision or verbal cues Chair/bed transfer assistive device: Armrests, Medical sales representative     Max distance: 54 ft Assist level: Supervision or verbal cues   Wheelchair   Type: Manual Max wheelchair distance: 150 ft Assist Level: Supervision or verbal cues  Cognition Comprehension Comprehension assist level: Follows basic conversation/direction with no assist  Expression Expression assist level: Expresses basic needs/ideas: With no assist  Social Interaction Social Interaction assist level: Interacts appropriately with others with medication or extra time (anti-anxiety, antidepressant).  Problem Solving Problem solving assist level: Solves basic 90% of the time/requires cueing < 10% of the time  Memory Memory assist level: Recognizes or recalls 75 - 89% of the time/requires cueing 10 - 24% of the time    Medical Problem List and Plan: 1.  Gait abnormality, difficulty with transfers, limitation with self-care secondary to left BKA.  Plan to d/c today.  Will coordinate with HD as well as speak to Vascular regarding RLE. 2.  Anticoagulation (Mechanical heart valve):     Coumadin 3. Pain Management: L phantom limb pain D/Ced Gabapentin, increased  Lyrica to 75mg . Used dilaudid at home for CLBP.  Monitor with increased activity  Lidocaine ointment for pressure sores  Suspect chronic pain/behavioral component.  Neuropsych recommended  -added robaxin for muscle spasms.   Educated on desensitization techniques.   Roxi 5 BID PRN added 4/8 4. Mood: Improving with decrease in pain. LCSW to follow for evaluation and support.  5. Neuropsych: This patient is capable of making  decisions on her own behalf. 6. Skin/Wound Care: Routine wound care. Stump healing-Appreciate VVS note  -pressure sore Right heel 7. Fluids/Electrolytes/Nutrition: Strict I/O. Serial labs with HD to monitor lytes 8. ESRD: Monitor weight daily with strict I/O. Schedule HD late afternoons to help with tolerance of activity during the day.  9. CAD s/p CABG with AVR: Cont meds 10. 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 T2DM: Monitor BS ac/hs. Po intake poor--used 2 units levemir daily prn BS > 150. Will use SSI for elevated BS until intake is consistent.   Overall controlled 4/9 12. HTN: Monitor bid.   Labile at times, monitor in accordance with HD per nephro, patient has terminated HD AMA at times  Relatively controlled 4/9 13. H/o Migraines: Managed with prn medications.  14. Anemia of chronic disease: Aranesp weekly with transfusion prn.   Hb 10.6 on 4/9  Improved- Aranesp per nephro 15. COPD: Respiratory status stable.  16. Leucocytosis:.   WBCs 10.7 on 4/9  afebrile 17.  PVD Right toes cool and dusky with pain  Will speak with Vascular due to development of rest pain 18.  Nausea Improved- renal and hepatic failure, no diarrhea, trial reglan  LOS (Days) 19 A FACE  TO FACE EVALUATION WAS PERFORMED  [name] 05/19/2016 9:25 AM

## 2016-05-20 DIAGNOSIS — I70229 Atherosclerosis of native arteries of extremities with rest pain, unspecified extremity: Secondary | ICD-10-CM

## 2016-05-20 LAB — GLUCOSE, CAPILLARY
Glucose-Capillary: 138 mg/dL — ABNORMAL HIGH (ref 65–99)
Glucose-Capillary: 135 mg/dL — ABNORMAL HIGH (ref 65–99)

## 2016-05-20 LAB — PROTIME-INR
INR: 2.62
PROTHROMBIN TIME: 28.5 s — AB (ref 11.4–15.2)

## 2016-05-20 MED ORDER — METOPROLOL TARTRATE 25 MG PO TABS
25.0000 mg | ORAL_TABLET | Freq: Two times a day (BID) | ORAL | 0 refills | Status: DC
Start: 2016-05-20 — End: 2016-07-24

## 2016-05-20 MED ORDER — HYDROMORPHONE HCL 2 MG PO TABS: 2.0000 mg | ORAL_TABLET | Freq: Two times a day (BID) | ORAL | 0 refills | Status: DC | PRN

## 2016-05-20 MED ORDER — POLYETHYLENE GLYCOL 3350 17 G PO PACK: 17.0000 g | PACK | Freq: Two times a day (BID) | ORAL | 0 refills | Status: DC

## 2016-05-20 MED ORDER — PANTOPRAZOLE SODIUM 40 MG PO TBEC: 40.0000 mg | DELAYED_RELEASE_TABLET | Freq: Every day | ORAL | 1 refills | Status: DC

## 2016-05-20 MED ORDER — OXYCODONE HCL 5 MG PO TABS: 5.0000 mg | ORAL_TABLET | Freq: Two times a day (BID) | ORAL | 0 refills | Status: DC | PRN

## 2016-05-20 MED ORDER — RENA-VITE PO TABS: 1.0000 | ORAL_TABLET | Freq: Every day | ORAL | 0 refills | Status: AC

## 2016-05-20 MED ORDER — HYDROMORPHONE HCL 2 MG PO TABS: 2.0000 mg | ORAL_TABLET | Freq: Every evening | ORAL | 0 refills | Status: DC | PRN

## 2016-05-20 MED ORDER — PREGABALIN 75 MG PO CAPS: 75.0000 mg | ORAL_CAPSULE | Freq: Every day | ORAL | 0 refills | Status: DC

## 2016-05-20 MED ORDER — ONDANSETRON 4 MG PO TBDP: 4.0000 mg | ORAL_TABLET | Freq: Three times a day (TID) | ORAL | 0 refills | Status: DC | PRN

## 2016-05-20 MED ORDER — WARFARIN SODIUM 2.5 MG PO TABS: 2.5000 mg | ORAL_TABLET | Freq: Every day | ORAL | 0 refills | Status: DC

## 2016-05-20 MED ORDER — METOCLOPRAMIDE HCL 5 MG PO TABS: 5.0000 mg | ORAL_TABLET | Freq: Two times a day (BID) | ORAL | 0 refills | Status: DC

## 2016-05-20 MED ORDER — METHOCARBAMOL 500 MG PO TABS: 250.0000 mg | ORAL_TABLET | Freq: Four times a day (QID) | ORAL | 0 refills | Status: DC | PRN

## 2016-05-20 MED ORDER — WARFARIN SODIUM 2.5 MG PO TABS: 2.5000 mg | ORAL_TABLET | Freq: Once | ORAL | Status: DC

## 2016-05-20 MED ORDER — TRAMADOL HCL 50 MG PO TABS: 50.0000 mg | ORAL_TABLET | Freq: Four times a day (QID) | ORAL | 0 refills | Status: DC | PRN

## 2016-05-20 MED ORDER — COLLAGENASE 250 UNIT/GM EX OINT: TOPICAL_OINTMENT | CUTANEOUS | 1 refills | Status: DC

## 2016-05-20 NOTE — Discharge Instructions (Addendum)
Inpatient Rehab Discharge Instructions  [name] Discharge date and time:  05/20/16  Activities/Precautions/ Functional Status: Activity: no lifting, driving, or strenuous exercise for till cleared by MD.  Diet: renal diet Wound Care: Wash with soap and water. Pat dry and apply dry compressive dressing. Contact MD if you develop any problems with your incision/wound--redness, swelling, increase in pain, drainage or if you develop fever or chills.                     --to area on buttock: Cleanse with soap and water. Pat dry. Apply santyl and cover with damp to dry dressing.    Functional status:  ___ No restrictions     ___ Walk up steps independently _X__ 24/7 supervision/assistance   ___ Walk up steps with assistance ___ Intermittent supervision/assistance  ___ Bathe/dress independently ___ Walk with walker     _X__ Bathe/dress with assistance ___ Walk Independently    ___ Shower independently ___ Walk with assistance    ___ Shower with assistance _X__ No alcohol     ___ Return to work/school ________   Special Instructions: 1.  Santyl with wet to dry dressing to sacral decub. Change daily.  2. Home Health Nurse to draw protime on 4/12 with results to [place] coumadin clinic.   COMMUNITY REFERRALS UPON DISCHARGE:    Home Health:   PT, OT, RN    [place] [phone]   Date of last service:05/13/2016  Medical Equipment/Items Ordered:WHEELCHAIR & [place]   [phone]   GENERAL COMMUNITY RESOURCES FOR PATIENT/FAMILY: Support Groups:[facility] SUPPORT GROUP EVERY FIRST Tuesday @ 7:00 AT [place] [phone]  My questions have been answered and I understand these instructions. I will adhere to these goals and the provided educational materials after my discharge from the hospital.  Patient/Caregiver Signature _______________________________ Date __________  Clinician  Signature _______________________________________ Date __________  Please bring this form and your medication list with you to all your follow-up doctor's appointments.

## 2016-05-20 NOTE — Discharge Summary (Signed)
Physician Discharge Summary  Patient ID: [name] MRN: [id number] DOB/AGE: 1955/03/14 61 y.o.  Admit date: 04/30/2016 Discharge date: 05/20/2016  Discharge Diagnoses:  Principal Problem:   Unilateral complete BKA, left, initial encounter [facility]) Active Problems:   S/P liver transplant ([place])   S/P unilateral BKA (below knee amputation), left (HCC)   Chronic midline low back pain without sciatica   ESRD on dialysis ([place])   Type 2 diabetes mellitus with complication, with long-term current use of insulin (HCC)   Anemia of chronic disease   Phantom limb pain (HCC)   H/O heart valve replacement with mechanical valve   Extremity atherosclerosis with resting pain (HCC)   Discharged Condition:  Stable   Significant Diagnostic Studies: Nm Myocar Multi W/spect W/wall Motion / Ef  Result Date: 04/22/2016 CLINICAL DATA:  Preop foot fracture. EXAM: MYOCARDIAL IMAGING WITH SPECT (REST AND PHARMACOLOGIC-STRESS) GATED LEFT VENTRICULAR WALL MOTION STUDY LEFT VENTRICULAR EJECTION FRACTION TECHNIQUE: Standard myocardial SPECT imaging was performed after resting intravenous injection of 10 mCi Tc-80m tetrofosmin. Subsequently, intravenous infusion of Lexiscan was performed under the supervision of the Cardiology staff. At peak effect of the drug, 30 mCi Tc-74m tetrofosmin was injected intravenously and standard myocardial SPECT imaging was performed. Quantitative gated imaging was also performed to evaluate left ventricular wall motion, and estimate left ventricular ejection fraction. COMPARISON:  None. FINDINGS: Perfusion: There is a fixed defects noted in the lateral wall extending into the anterior and inferior walls compatible with old infarct. No significant reversibility to suggest ischemia. Wall Motion: Mild generalized hypokinesia S, slightly more pronounced in the area of scar Left Ventricular Ejection Fraction: 46 % End diastolic volume 086 ml End systolic volume 79 ml IMPRESSION: 1. 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 No ischemia. 2. Generalized hypokinesia, slightly more pronounced in the area of scar. 3. Left ventricular ejection fraction 46% 4. Non invasive risk stratification*: In hernia *2012 Appropriate Use Criteria for Coronary Revascularization Focused Update: J Am Coll Cardiol. 5784;69(6):295-284. http://content.airportbarriers.com.aspx?articleid=1201161 Electronically Signed   By: Rolm Baptise M.D.   On: 04/22/2016 15:54    Labs:  Basic Metabolic Panel:  Recent Labs Lab 05/16/16 1257 05/19/16 1400  NA 133* 134*  K 4.6 3.8  CL 99* 100*  CO2 24 25  GLUCOSE 217* 202*  BUN 21* 30*  CREATININE 5.52* 7.08*  CALCIUM 8.5* 8.3*  PHOS 4.3 5.5*    CBC:  Recent Labs Lab 05/17/16 0550 05/18/16 0523 05/19/16 0632  WBC 12.3* 10.3 10.7*  HGB 10.5* 9.9* 10.6*  HCT 32.1* 30.1* 31.6*  MCV 87.7 87.0 87.1  PLT 230 217 223    CBG:  Recent Labs Lab 05/19/16 1148 05/19/16 1829 05/19/16 2122 05/20/16 0646 05/20/16 1202  GLUCAP 152* 103* 170* 138* 135*    Brief HPI:   [name] Blecha [name] a 61 y.o.femalewith history of ESRD--HD MWF, Hep C s/p liver transplant, CAD s/p CABG with AVR, chronic pain, PAD with chronic heel ulcer and ischemic toes. She has had progressive pain and was in process of being set for BKA. She was admitted on 04/21/16 with excruciating pain and inability to stand. Chronic coumadin held with heparin bridge till INR normalized. She underwent lexiscan and was cleared for surgery by cardiology. She underwent L-BKA by Dr. Scot Dock on 04/24/16.  Continues on IV heparin as INR subtherapeutic. Noted to have drop in H/H to 8.0/23.8 and to be transfused with 2 units PRBC. She continues to be limited by lethargy, intermittent confusion  and pain affecting ADL tasks and mobility. CIR recommended for follow up therapy   Hospital Course: [name] was admitted to rehab 04/30/2016 for inpatient therapies to consist of  PT and OT at least three hours five days a week. Past admission physiatrist, therapy team and rehab RN have worked together to provide customized collaborative inpatient rehab. Mentation has slowly improved with increase in activity tolerance. Blood pressures have been stable on low dose BB. She had issue with nausea with poor po intake therefore diet was liberalized. She was started on reglan bid ac with resolution of nausea.  HD has been ongoing on MWF after therapy sessions to help with tolerance of activity. She is continent of bowel and constipation is managed with senna at bedtime.  She did have drop in INR and was cross covered with heparin till INR rose to therapeutic level. INR is 2.62 and she was discharged on coumadin 2.5 mg daily.   ABLA is slowly resolving and H/H is up to 10.6. L-BKA incision is C/D/I with staples in place. Right toes continue to be ischemic appearing and she has developed rest pain right foot in the past few days. No breakdown noted but pain continues to be severe at nights. She has been weaned to one dilaudid at nights prn with oxycodone bid prn during the day. She is has made good progress and is at supervision level. Her discharge was extended due to subtherapeutic INR as well as need for ramp and safe discharge to home. She will continue to receive follow up [place], [place], [place] and [place] aide by [facility] after discharge.    Rehab course: During patient's stay in rehab weekly team conferences were held to monitor patient's progress, set goals and discuss barriers to discharge. At admission, patient required max assist with mobility and mod assist with basic self care tasks. She  has had improvement in activity tolerance, balance, postural control, as well as ability to compensate for deficits.  She is able to perform ADL tasks with supervision. She is able to perform transfers with supervision. She is able to ambulate 36' with RW and close supervision.      Disposition: Home  Diet: Renal/Diabetic diet.  Special Instructions: 1. HHRN to draw protime on 4/12 with results to [place] coumadin clinic.  2. Cover sacral decub with santyl and damp to dry dressing. Change daily.   Discharge Instructions    Ambulatory referral to Physical Medicine Rehab    Complete by:  As directed    1-2 weeks transitional care appt    2. Check blood sugars before meals and at bedtime. Resume lantus per home protocol.    Allergies as of 05/20/2016      Reactions   Acetaminophen Other (See Comments)   Liver transplant recipient    Morphine Itching, Other (See Comments)   hallucinate   Codeine Itching   Mirtazapine Other (See Comments)   hallucination      Medication List    STOP taking these medications   feeding supplement (PRO-STAT SUGAR FREE 64) Liqd   gabapentin 100 MG capsule Commonly known as:  NEURONTIN   gabapentin 300 MG capsule Commonly known as:  NEURONTIN     TAKE these medications   aspirin EC 81 MG tablet Take 81 mg by mouth daily.   calcitRIOL 0.5 MCG capsule Commonly known as:  ROCALTROL Take 1 capsule (0.5 mcg total) by mouth every Monday, Wednesday, and Friday with hemodialysis.   collagenase ointment  Commonly known as:  SANTYL Cleanse area daily. Then apply santyl and cover cover with damp to dry dressing.   feeding supplement Liqd Take 1 Container by mouth 3 (three) times daily between meals.   guaiFENesin-dextromethorphan 100-10 MG/5ML syrup Commonly known as:  ROBITUSSIN DM Take 15 mLs by mouth every 4 (four) hours as needed for cough.   HYDROmorphone 2 MG tablet--Rx # 15 pills  Commonly known as:  DILAUDID Take 1 tablet (2 mg total) by mouth at bedtime as needed for severe pain. What changed:  when to take this  reasons to take this   levothyroxine 175 MCG tablet Commonly known as:  SYNTHROID, LEVOTHROID TAKE 1 TABLET (175 MCG TOTAL) BY MOUTH DAILY BEFORE BREAKFAST.   methocarbamol 500 MG  tablet Commonly known as:  ROBAXIN Take 0.5 tablets (250 mg total) by mouth every 6 (six) hours as needed for muscle spasms.   metoCLOPramide 5 MG tablet Commonly known as:  REGLAN Take 1 tablet (5 mg total) by mouth 2 (two) times daily before a meal. Decrease to once a day before breakfast after a couple of weeks.   metoprolol tartrate 25 MG tablet Commonly known as:  LOPRESSOR Take 1 tablet (25 mg total) by mouth 2 (two) times daily.   multivitamin Tabs tablet Take 1 tablet by mouth at bedtime.   nitroGLYCERIN 0.4 MG SL tablet Commonly known as:  NITROSTAT Place 1 tablet (0.4 mg total) under the tongue every 5 (five) minutes as needed for chest pain.   ondansetron 4 MG disintegrating tablet Commonly known as:  ZOFRAN ODT Take 1 tablet (4 mg total) by mouth every 8 (eight) hours as needed for nausea.   oxyCODONE 5 MG immediate release tablet--Rx # 30 pills  Commonly known as:  Oxy IR/ROXICODONE Take 1 tablet (5 mg total) by mouth 2 (two) times daily as needed for severe pain or breakthrough pain. What changed:  when to take this  reasons to take this Notes to patient:  May use in morning and afternoon if needed.   pantoprazole 40 MG tablet Commonly known as:  PROTONIX Take 1 tablet (40 mg total) by mouth daily.   polyethylene glycol packet Commonly known as:  MIRALAX / GLYCOLAX Take 17 g by mouth 2 (two) times daily. What changed:  when to take this  reasons to take this   pregabalin 75 MG capsule Commonly known as:  LYRICA Take 1 capsule (75 mg total) by mouth daily. Start taking on:  05/21/2016   senna-docusate 8.6-50 MG tablet Commonly known as:  Senokot-S Take 1 tablet by mouth daily.   tacrolimus 1 MG capsule Commonly known as:  PROGRAF Take 2 capsules (2 mg total) by mouth 2 (two) times daily. Take 2 capsules twice daily.   traMADol 50 MG tablet--Rx # 60 pills  Commonly known as:  ULTRAM Take 1 tablet (50 mg total) by mouth every 6 (six) hours as  needed for moderate pain.   warfarin 2.5 MG tablet Commonly known as:  COUMADIN Take 1 tablet (2.5 mg total) by mouth daily at 6 PM. What changed:  medication strength  how much to take  how to take this  when to take this  additional instructions      Follow-up Information    Ankit [name], MD Follow up.   Specialty:  Physical Medicine and Rehabilitation Why:  office will call you with follow up appointment Contact information: [address] [place] [address] [phone]  [name], MD Follow up.   Specialties:  Vascular Surgery, Cardiology Contact information: [place] [address] [phone]        [name], MD Follow up on 05/15/2016.   Specialty:  Family Medicine Why:  NEEDS TO PAY BALANCE ON ACCOUNT BEFORE CAN MAKE FOLLOW UP APPOINTMENT Contact information: [place]        Shelva Majestic, MD Follow up.   Specialty:  Cardiology Contact information: [address] [place] [address] [phone]           Signed: Bary Leriche 05/20/2016, 5:17 PM

## 2016-05-20 NOTE — Progress Notes (Signed)
[facility] PHYSICAL MEDICINE & REHABILITATION     PROGRESS NOTE  Subjective/Complaints: Pt seen laying in bed this AM.  She slept well overnight. She was scheduled to be d/ced yesterday, but HD was late and no ramp at home.   ROS: Denies CP, SOB, N/V/D.  Objective: Vital Signs: Blood pressure (!) 144/56, pulse 77, temperature 98.4 F (36.9 C), temperature source Oral, resp. rate 16, height 5\' 4"  (1.626 m), weight 54.4 kg (120 lb), SpO2 97 %. No results found.  Recent Labs  05/18/16 0523 05/19/16 0632  WBC 10.3 10.7*  HGB 9.9* 10.6*  HCT 30.1* 31.6*  PLT 217 223    Recent Labs  05/19/16 1400  NA 134*  K 3.8  CL 100*  GLUCOSE 202*  BUN 30*  CREATININE 7.08*  CALCIUM 8.3*   CBG (last 3)   Recent Labs  05/19/16 1829 05/19/16 2122 05/20/16 0646  GLUCAP 103* 170* 138*    Wt Readings from Last 3 Encounters:  05/20/16 54.4 kg (120 lb)  04/30/16 54.7 kg (120 lb 9.5 oz)  04/17/16 58.7 kg (129 lb 6.4 oz)    Physical Exam:  BP (!) 144/56 (BP Location: Left Arm)   Pulse 77   Temp 98.4 F (36.9 C) (Oral)   Resp 16   Ht 5\' 4"  (1.626 m)   Wt 54.4 kg (120 lb)   SpO2 97%   BMI 20.60 kg/m  Constitutional: She appears well-developed. Frail. NAD. HENT: Normocephalic and atraumatic. Edentulous   Eyes: EOMI. No discharge.  Cardiovascular: RRR. No JVD. Respiratory: Normal effort. Clear GI: Soft. Bowel sounds are normal.  Musculoskeletal: She exhibits edema and tenderness.  Neurological: She is alert and oriented.  Motor: B/l UE 4/5 RLE: HF 3+/5, KE 3+/5, ADF/PF 4/5  LLE: HF 4/5 (stable) Skin: Skin is warm and dry. Dusky extremities  Right toes dusky,with boggy heel.   Stump with staples c/d/i Psychiatric: Normal mood and affect  Assessment/Plan: 1. Functional deficits secondary to left BKA which require 3+ hours per day of interdisciplinary therapy in a comprehensive inpatient rehab setting. Physiatrist is providing close team supervision and 24 hour management  of active medical problems listed below. Physiatrist and rehab team continue to assess barriers to discharge/monitor patient progress toward functional and medical goals.  Function:  Bathing Bathing position   Position: Wheelchair/chair at sink  Bathing parts Body parts bathed by patient: Right arm, Left arm, Chest, Abdomen, Buttocks, Right upper leg, Left upper leg, Right lower leg, Front perineal area Body parts bathed by helper: Back  Bathing assist Assist Level: Supervision or verbal cues      Upper Body Dressing/Undressing Upper body dressing   What is the patient wearing?: Pull over shirt/dress     Pull over shirt/dress - Perfomed by patient: Thread/unthread right sleeve, Thread/unthread left sleeve, Put head through opening, Pull shirt over trunk          Upper body assist Assist Level: More than reasonable time   Set up : To obtain clothing/put away  Lower Body Dressing/Undressing Lower body dressing   What is the patient wearing?: Pants, Non-skid slipper socks, Underwear Underwear - Performed by patient: Thread/unthread right underwear leg, Thread/unthread left underwear leg, Pull underwear up/down Underwear - Performed by helper: Pull underwear up/down Pants- Performed by patient: Thread/unthread right pants leg, Thread/unthread left pants leg, Pull pants up/down Pants- Performed by helper: Thread/unthread right pants leg, Thread/unthread left pants leg, Pull pants up/down Non-skid slipper socks- Performed by patient: Don/doff right sock Non-skid  slipper socks- Performed by helper: Don/doff right sock                  Lower body assist Assist for lower body dressing: Supervision or verbal cues      Toileting Toileting Toileting activity did not occur: No continent bowel/bladder event Toileting steps completed by patient: Adjust clothing prior to toileting, Performs perineal hygiene, Adjust clothing after toileting Toileting steps completed by helper: Adjust  clothing after toileting Toileting Assistive Devices: Grab bar or rail  Toileting assist Assist level: Supervision or verbal cues   Transfers Chair/bed transfer   Chair/bed transfer method: Stand pivot, Squat pivot Chair/bed transfer assist level: Supervision or verbal cues Chair/bed transfer assistive device: Armrests, Medical sales representative     Max distance: 54 ft Assist level: Supervision or verbal cues   Wheelchair   Type: Manual Max wheelchair distance: 150 ft Assist Level: Supervision or verbal cues  Cognition Comprehension Comprehension assist level: Follows basic conversation/direction with no assist  Expression Expression assist level: Expresses basic needs/ideas: With no assist  Social Interaction Social Interaction assist level: Interacts appropriately with others with medication or extra time (anti-anxiety, antidepressant).  Problem Solving Problem solving assist level: Solves basic 90% of the time/requires cueing < 10% of the time  Memory Memory assist level: Recognizes or recalls 75 - 89% of the time/requires cueing 10 - 24% of the time    Medical Problem List and Plan: 1.  Gait abnormality, difficulty with transfers, limitation with self-care secondary to left BKA.  D/c today  Will see patient for transitional care management in 1-2 weeks 2.  Anticoagulation (Mechanical heart valve):     Coumadin, INR therapeutic 3. Pain Management: L phantom limb pain D/Ced Gabapentin, increased  Lyrica to 75mg .   Used dilaudid at home for CLBP.  Monitor with increased activity  Lidocaine ointment for pressure sores  Suspect chronic pain/behavioral component.  Neuropsych recommended  -added robaxin for muscle spasms.   Educated on desensitization techniques.   Roxi 5 BID PRN added 4/8 4. Mood: Improving with decrease in pain. LCSW to follow for evaluation and support.  5. Neuropsych: This patient is capable of making decisions on her own behalf. 6. Skin/Wound  Care: Routine wound care. Stump healing-Appreciate VVS note  -pressure sore Right heel 7. Fluids/Electrolytes/Nutrition: Strict I/O. Serial labs with HD to monitor lytes 8. ESRD: Monitor weight daily with strict I/O. Schedule HD late afternoons to help with tolerance of activity during the day.  9. CAD s/p CABG with AVR: Cont meds 10. 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 T2DM: Monitor BS ac/hs. Po intake poor--used 2 units levemir daily prn BS > 150. Will use SSI for elevated BS until intake is consistent.   Overall controlled 4/10 12. HTN: Monitor bid.   Labile at times, monitor in accordance with HD per nephro, patient has terminated HD AMA at times  Relatively controlled 4/10 13. H/o Migraines: Managed with prn medications.  14. Anemia of chronic disease: Aranesp weekly with transfusion prn.   Hb 10.6 on 4/9  Improved- Aranesp per nephro 15. COPD: Respiratory status stable.  16. Leucocytosis:.   WBCs 10.7 on 4/9  afebrile 17.  PVD Right toes cool and dusky with rest pain  Vascular to follow as outpt 18.  Nausea Improved- renal and hepatic failure, no diarrhea, trial reglan  LOS (Days) 20 A FACE TO FACE EVALUATION WAS PERFORMED  [name] [name] 05/20/2016 9:03 AM

## 2016-05-20 NOTE — Progress Notes (Signed)
ANTICOAGULATION CONSULT NOTE - Follow Up Consult  Pharmacy Consult for coumadin Indication: AVR  Allergies  Allergen Reactions  . Acetaminophen Other (See Comments)    Liver transplant recipient   . Morphine Itching and Other (See Comments)    hallucinate  . Codeine Itching  . Mirtazapine Other (See Comments)    hallucination    Patient Measurements: Height: 5\' 4"  (162.6 cm) Weight: 120 lb (54.4 kg) IBW/kg (Calculated) : 54.7 Heparin Dosing Weight:   Vital Signs: Temp: 98.4 F (36.9 C) (04/10 0500) Temp Source: Oral (04/10 0500) BP: 144/56 (04/10 0500) Pulse Rate: 77 (04/10 0500)  Labs:  Recent Labs  05/17/16 1343 05/18/16 0523 05/19/16 0632 05/19/16 1400  HGB  --  9.9* 10.6*  --   HCT  --  30.1* 31.6*  --   PLT  --  217 223  --   LABPROT  --  24.3* 29.2*  --   INR  --  2.15 2.69  --   HEPARINUNFRC 0.59 0.49 0.46  --   CREATININE  --   --   --  7.08*    Estimated Creatinine Clearance: 7.2 mL/min (A) (by C-G formula based on SCr of 7.08 mg/dL (H)).   Medications:  Scheduled:  . aspirin EC  81 mg Oral Daily  . calcitRIOL  0.5 mcg Oral Q M,W,F-HD  . collagenase   Topical Daily  . feeding supplement  1 Container Oral TID BM  . feeding supplement (PRO-STAT SUGAR FREE 64)  30 mL Oral BID  . hydrocortisone  25 mg Rectal BID  . HYDROmorphone  2 mg Oral Once  . insulin aspart  0-5 Units Subcutaneous QHS  . insulin aspart  0-9 Units Subcutaneous TID WC  . levothyroxine  175 mcg Oral QAC breakfast  . metoCLOPramide  5 mg Oral BID AC  . metoprolol tartrate  25 mg Oral BID  . multivitamin  1 tablet Oral QHS  . pantoprazole  40 mg Oral Daily  . polyethylene glycol  17 g Oral BID  . pregabalin  75 mg Oral Daily  . tacrolimus  2 mg Oral BID  . Warfarin - Pharmacist Dosing Inpatient   Does not apply q1800  . zinc sulfate  220 mg Oral Daily   Infusions:    Assessment: 61 yo female with AVR is currently on therapeutic coumadin.  INR today is 2.62.  Goal of  Therapy:  INR 2.5-3.5 Monitor platelets by anticoagulation protocol: Yes   Plan:  - coumadin 2.5 mg po x1 - INR in am if not discharged  Mariaguadalupe Fialkowski, Tsz-Yin 05/20/2016,8:19 AM

## 2016-05-21 ENCOUNTER — Telehealth: Payer: Self-pay

## 2016-05-21 DIAGNOSIS — E118 Type 2 diabetes mellitus with unspecified complications: Secondary | ICD-10-CM | POA: Diagnosis not present

## 2016-05-21 DIAGNOSIS — N186 End stage renal disease: Secondary | ICD-10-CM | POA: Diagnosis not present

## 2016-05-21 DIAGNOSIS — Z23 Encounter for immunization: Secondary | ICD-10-CM | POA: Diagnosis not present

## 2016-05-21 DIAGNOSIS — I359 Nonrheumatic aortic valve disorder, unspecified: Secondary | ICD-10-CM | POA: Diagnosis not present

## 2016-05-21 DIAGNOSIS — E876 Hypokalemia: Secondary | ICD-10-CM | POA: Diagnosis not present

## 2016-05-21 DIAGNOSIS — N2581 Secondary hyperparathyroidism of renal origin: Secondary | ICD-10-CM | POA: Diagnosis not present

## 2016-05-21 NOTE — Telephone Encounter (Signed)
Called multiple numbers on patients list, no answer on any, left voicemail to call back  Patient name: [name], Mago DOB: 10/01/1955 Appointment Date/time: 05-29-2016/1140am, arrive time: 11:20am and who it is with here Dr. Posey Pronto

## 2016-05-22 ENCOUNTER — Ambulatory Visit (INDEPENDENT_AMBULATORY_CARE_PROVIDER_SITE_OTHER): Payer: Medicare Other | Admitting: Pharmacist

## 2016-05-22 DIAGNOSIS — I5042 Chronic combined systolic (congestive) and diastolic (congestive) heart failure: Secondary | ICD-10-CM | POA: Diagnosis not present

## 2016-05-22 DIAGNOSIS — E1122 Type 2 diabetes mellitus with diabetic chronic kidney disease: Secondary | ICD-10-CM | POA: Diagnosis not present

## 2016-05-22 DIAGNOSIS — E1142 Type 2 diabetes mellitus with diabetic polyneuropathy: Secondary | ICD-10-CM | POA: Diagnosis not present

## 2016-05-22 DIAGNOSIS — Z955 Presence of coronary angioplasty implant and graft: Secondary | ICD-10-CM | POA: Diagnosis not present

## 2016-05-22 DIAGNOSIS — G546 Phantom limb syndrome with pain: Secondary | ICD-10-CM | POA: Diagnosis not present

## 2016-05-22 DIAGNOSIS — Z794 Long term (current) use of insulin: Secondary | ICD-10-CM | POA: Diagnosis not present

## 2016-05-22 DIAGNOSIS — Z5181 Encounter for therapeutic drug level monitoring: Secondary | ICD-10-CM | POA: Diagnosis not present

## 2016-05-22 DIAGNOSIS — Z993 Dependence on wheelchair: Secondary | ICD-10-CM | POA: Diagnosis not present

## 2016-05-22 DIAGNOSIS — F329 Major depressive disorder, single episode, unspecified: Secondary | ICD-10-CM | POA: Diagnosis not present

## 2016-05-22 DIAGNOSIS — I2510 Atherosclerotic heart disease of native coronary artery without angina pectoris: Secondary | ICD-10-CM | POA: Diagnosis not present

## 2016-05-22 DIAGNOSIS — Z7901 Long term (current) use of anticoagulants: Secondary | ICD-10-CM | POA: Diagnosis not present

## 2016-05-22 DIAGNOSIS — Z944 Liver transplant status: Secondary | ICD-10-CM | POA: Diagnosis not present

## 2016-05-22 DIAGNOSIS — F419 Anxiety disorder, unspecified: Secondary | ICD-10-CM | POA: Diagnosis not present

## 2016-05-22 DIAGNOSIS — E11622 Type 2 diabetes mellitus with other skin ulcer: Secondary | ICD-10-CM | POA: Diagnosis not present

## 2016-05-22 DIAGNOSIS — N186 End stage renal disease: Secondary | ICD-10-CM | POA: Diagnosis not present

## 2016-05-22 DIAGNOSIS — F17210 Nicotine dependence, cigarettes, uncomplicated: Secondary | ICD-10-CM | POA: Diagnosis not present

## 2016-05-22 DIAGNOSIS — I10 Essential (primary) hypertension: Secondary | ICD-10-CM | POA: Diagnosis not present

## 2016-05-22 DIAGNOSIS — Z7982 Long term (current) use of aspirin: Secondary | ICD-10-CM | POA: Diagnosis not present

## 2016-05-22 DIAGNOSIS — Z4781 Encounter for orthopedic aftercare following surgical amputation: Secondary | ICD-10-CM | POA: Diagnosis not present

## 2016-05-22 DIAGNOSIS — D631 Anemia in chronic kidney disease: Secondary | ICD-10-CM | POA: Diagnosis not present

## 2016-05-22 DIAGNOSIS — M1990 Unspecified osteoarthritis, unspecified site: Secondary | ICD-10-CM | POA: Diagnosis not present

## 2016-05-22 DIAGNOSIS — J449 Chronic obstructive pulmonary disease, unspecified: Secondary | ICD-10-CM | POA: Diagnosis not present

## 2016-05-22 DIAGNOSIS — I132 Hypertensive heart and chronic kidney disease with heart failure and with stage 5 chronic kidney disease, or end stage renal disease: Secondary | ICD-10-CM | POA: Diagnosis not present

## 2016-05-22 DIAGNOSIS — E1151 Type 2 diabetes mellitus with diabetic peripheral angiopathy without gangrene: Secondary | ICD-10-CM | POA: Diagnosis not present

## 2016-05-22 DIAGNOSIS — K219 Gastro-esophageal reflux disease without esophagitis: Secondary | ICD-10-CM | POA: Diagnosis not present

## 2016-05-22 DIAGNOSIS — Z992 Dependence on renal dialysis: Secondary | ICD-10-CM | POA: Diagnosis not present

## 2016-05-22 LAB — POCT INR: INR: 3.4

## 2016-05-23 DIAGNOSIS — E118 Type 2 diabetes mellitus with unspecified complications: Secondary | ICD-10-CM | POA: Diagnosis not present

## 2016-05-23 DIAGNOSIS — N2581 Secondary hyperparathyroidism of renal origin: Secondary | ICD-10-CM | POA: Diagnosis not present

## 2016-05-23 DIAGNOSIS — Z23 Encounter for immunization: Secondary | ICD-10-CM | POA: Diagnosis not present

## 2016-05-23 DIAGNOSIS — E876 Hypokalemia: Secondary | ICD-10-CM | POA: Diagnosis not present

## 2016-05-23 DIAGNOSIS — N186 End stage renal disease: Secondary | ICD-10-CM | POA: Diagnosis not present

## 2016-05-26 DIAGNOSIS — E876 Hypokalemia: Secondary | ICD-10-CM | POA: Diagnosis not present

## 2016-05-26 DIAGNOSIS — N186 End stage renal disease: Secondary | ICD-10-CM | POA: Diagnosis not present

## 2016-05-26 DIAGNOSIS — Z23 Encounter for immunization: Secondary | ICD-10-CM | POA: Diagnosis not present

## 2016-05-26 DIAGNOSIS — N2581 Secondary hyperparathyroidism of renal origin: Secondary | ICD-10-CM | POA: Diagnosis not present

## 2016-05-26 DIAGNOSIS — E118 Type 2 diabetes mellitus with unspecified complications: Secondary | ICD-10-CM | POA: Diagnosis not present

## 2016-05-27 ENCOUNTER — Telehealth: Payer: Self-pay

## 2016-05-27 ENCOUNTER — Inpatient Hospital Stay (HOSPITAL_COMMUNITY)
Admission: AD | Admit: 2016-05-27 | Discharge: 2016-05-28 | DRG: 291 | Disposition: A | Discharge status: Home / Self Care | Payer: Medicare Other | Source: Other Acute Inpatient Hospital | Attending: Internal Medicine | Admitting: Internal Medicine

## 2016-05-27 ENCOUNTER — Encounter (HOSPITAL_COMMUNITY): Payer: Self-pay | Admitting: General Practice

## 2016-05-27 DIAGNOSIS — Z905 Acquired absence of kidney: Secondary | ICD-10-CM | POA: Diagnosis not present

## 2016-05-27 DIAGNOSIS — L89619 Pressure ulcer of right heel, unspecified stage: Secondary | ICD-10-CM | POA: Diagnosis present

## 2016-05-27 DIAGNOSIS — R069 Unspecified abnormalities of breathing: Secondary | ICD-10-CM | POA: Diagnosis not present

## 2016-05-27 DIAGNOSIS — R0602 Shortness of breath: Secondary | ICD-10-CM | POA: Diagnosis not present

## 2016-05-27 DIAGNOSIS — Z886 Allergy status to analgesic agent status: Secondary | ICD-10-CM | POA: Diagnosis not present

## 2016-05-27 DIAGNOSIS — I2510 Atherosclerotic heart disease of native coronary artery without angina pectoris: Secondary | ICD-10-CM | POA: Diagnosis present

## 2016-05-27 DIAGNOSIS — B182 Chronic viral hepatitis C: Secondary | ICD-10-CM | POA: Diagnosis present

## 2016-05-27 DIAGNOSIS — J9601 Acute respiratory failure with hypoxia: Secondary | ICD-10-CM | POA: Diagnosis not present

## 2016-05-27 DIAGNOSIS — J449 Chronic obstructive pulmonary disease, unspecified: Secondary | ICD-10-CM | POA: Diagnosis present

## 2016-05-27 DIAGNOSIS — Z720 Tobacco use: Secondary | ICD-10-CM | POA: Diagnosis present

## 2016-05-27 DIAGNOSIS — I132 Hypertensive heart and chronic kidney disease with heart failure and with stage 5 chronic kidney disease, or end stage renal disease: Principal | ICD-10-CM | POA: Diagnosis present

## 2016-05-27 DIAGNOSIS — Z951 Presence of aortocoronary bypass graft: Secondary | ICD-10-CM

## 2016-05-27 DIAGNOSIS — R0600 Dyspnea, unspecified: Secondary | ICD-10-CM | POA: Diagnosis not present

## 2016-05-27 DIAGNOSIS — Z885 Allergy status to narcotic agent status: Secondary | ICD-10-CM

## 2016-05-27 DIAGNOSIS — I119 Hypertensive heart disease without heart failure: Secondary | ICD-10-CM | POA: Diagnosis present

## 2016-05-27 DIAGNOSIS — N185 Chronic kidney disease, stage 5: Secondary | ICD-10-CM

## 2016-05-27 DIAGNOSIS — Z7901 Long term (current) use of anticoagulants: Secondary | ICD-10-CM

## 2016-05-27 DIAGNOSIS — Z992 Dependence on renal dialysis: Secondary | ICD-10-CM

## 2016-05-27 DIAGNOSIS — D631 Anemia in chronic kidney disease: Secondary | ICD-10-CM | POA: Diagnosis not present

## 2016-05-27 DIAGNOSIS — Z79899 Other long term (current) drug therapy: Secondary | ICD-10-CM | POA: Diagnosis not present

## 2016-05-27 DIAGNOSIS — J9690 Respiratory failure, unspecified, unspecified whether with hypoxia or hypercapnia: Secondary | ICD-10-CM | POA: Diagnosis present

## 2016-05-27 DIAGNOSIS — K219 Gastro-esophageal reflux disease without esophagitis: Secondary | ICD-10-CM | POA: Diagnosis present

## 2016-05-27 DIAGNOSIS — N189 Chronic kidney disease, unspecified: Secondary | ICD-10-CM

## 2016-05-27 DIAGNOSIS — Z833 Family history of diabetes mellitus: Secondary | ICD-10-CM

## 2016-05-27 DIAGNOSIS — E1122 Type 2 diabetes mellitus with diabetic chronic kidney disease: Secondary | ICD-10-CM | POA: Diagnosis present

## 2016-05-27 DIAGNOSIS — I350 Nonrheumatic aortic (valve) stenosis: Secondary | ICD-10-CM | POA: Diagnosis present

## 2016-05-27 DIAGNOSIS — I5023 Acute on chronic systolic (congestive) heart failure: Secondary | ICD-10-CM | POA: Diagnosis present

## 2016-05-27 DIAGNOSIS — Z888 Allergy status to other drugs, medicaments and biological substances status: Secondary | ICD-10-CM

## 2016-05-27 DIAGNOSIS — F329 Major depressive disorder, single episode, unspecified: Secondary | ICD-10-CM | POA: Diagnosis present

## 2016-05-27 DIAGNOSIS — Z8669 Personal history of other diseases of the nervous system and sense organs: Secondary | ICD-10-CM

## 2016-05-27 DIAGNOSIS — Z79891 Long term (current) use of opiate analgesic: Secondary | ICD-10-CM | POA: Diagnosis not present

## 2016-05-27 DIAGNOSIS — E785 Hyperlipidemia, unspecified: Secondary | ICD-10-CM | POA: Diagnosis present

## 2016-05-27 DIAGNOSIS — Z952 Presence of prosthetic heart valve: Secondary | ICD-10-CM | POA: Diagnosis not present

## 2016-05-27 DIAGNOSIS — N186 End stage renal disease: Secondary | ICD-10-CM | POA: Diagnosis not present

## 2016-05-27 DIAGNOSIS — Z7982 Long term (current) use of aspirin: Secondary | ICD-10-CM

## 2016-05-27 DIAGNOSIS — N2581 Secondary hyperparathyroidism of renal origin: Secondary | ICD-10-CM | POA: Diagnosis present

## 2016-05-27 DIAGNOSIS — I509 Heart failure, unspecified: Secondary | ICD-10-CM | POA: Diagnosis not present

## 2016-05-27 DIAGNOSIS — Z944 Liver transplant status: Secondary | ICD-10-CM | POA: Diagnosis not present

## 2016-05-27 DIAGNOSIS — I110 Hypertensive heart disease with heart failure: Secondary | ICD-10-CM | POA: Diagnosis not present

## 2016-05-27 DIAGNOSIS — I5043 Acute on chronic combined systolic (congestive) and diastolic (congestive) heart failure: Secondary | ICD-10-CM

## 2016-05-27 DIAGNOSIS — F17210 Nicotine dependence, cigarettes, uncomplicated: Secondary | ICD-10-CM | POA: Diagnosis present

## 2016-05-27 DIAGNOSIS — Z89512 Acquired absence of left leg below knee: Secondary | ICD-10-CM | POA: Diagnosis not present

## 2016-05-27 DIAGNOSIS — E1129 Type 2 diabetes mellitus with other diabetic kidney complication: Secondary | ICD-10-CM | POA: Diagnosis not present

## 2016-05-27 DIAGNOSIS — J9621 Acute and chronic respiratory failure with hypoxia: Secondary | ICD-10-CM | POA: Diagnosis present

## 2016-05-27 DIAGNOSIS — E8770 Fluid overload, unspecified: Secondary | ICD-10-CM | POA: Diagnosis present

## 2016-05-27 DIAGNOSIS — I252 Old myocardial infarction: Secondary | ICD-10-CM | POA: Diagnosis not present

## 2016-05-27 DIAGNOSIS — E1151 Type 2 diabetes mellitus with diabetic peripheral angiopathy without gangrene: Secondary | ICD-10-CM | POA: Diagnosis present

## 2016-05-27 DIAGNOSIS — E039 Hypothyroidism, unspecified: Secondary | ICD-10-CM | POA: Diagnosis present

## 2016-05-27 DIAGNOSIS — I5042 Chronic combined systolic (congestive) and diastolic (congestive) heart failure: Secondary | ICD-10-CM | POA: Diagnosis present

## 2016-05-27 DIAGNOSIS — L8990 Pressure ulcer of unspecified site, unspecified stage: Secondary | ICD-10-CM | POA: Insufficient documentation

## 2016-05-27 DIAGNOSIS — I5040 Unspecified combined systolic (congestive) and diastolic (congestive) heart failure: Secondary | ICD-10-CM | POA: Diagnosis not present

## 2016-05-27 DIAGNOSIS — M898X9 Other specified disorders of bone, unspecified site: Secondary | ICD-10-CM | POA: Diagnosis present

## 2016-05-27 HISTORY — DX: Heart failure, unspecified: I50.9

## 2016-05-27 LAB — GLUCOSE, CAPILLARY
GLUCOSE-CAPILLARY: 156 mg/dL — AB (ref 65–99)
GLUCOSE-CAPILLARY: 210 mg/dL — AB (ref 65–99)
Glucose-Capillary: 180 mg/dL — ABNORMAL HIGH (ref 65–99)

## 2016-05-27 LAB — PROTIME-INR
INR: 2.68
PROTHROMBIN TIME: 29.1 s — AB (ref 11.4–15.2)

## 2016-05-27 LAB — CBC
HEMATOCRIT: 31.4 % — AB (ref 36.0–46.0)
Hemoglobin: 10.4 g/dL — ABNORMAL LOW (ref 12.0–15.0)
MCH: 29.1 pg (ref 26.0–34.0)
MCHC: 33.1 g/dL (ref 30.0–36.0)
MCV: 88 fL (ref 78.0–100.0)
PLATELETS: 220 10*3/uL (ref 150–400)
RBC: 3.57 MIL/uL — ABNORMAL LOW (ref 3.87–5.11)
RDW: 17.7 % — AB (ref 11.5–15.5)
WBC: 9.4 10*3/uL (ref 4.0–10.5)

## 2016-05-27 LAB — TROPONIN I
Troponin I: 0.16 ng/mL (ref <0.03)
Troponin I: 0.14 ng/mL (ref <0.03)

## 2016-05-27 LAB — COMPREHENSIVE METABOLIC PANEL
ALBUMIN: 1.7 g/dL — AB (ref 3.5–5.0)
ALT: 13 U/L — ABNORMAL LOW (ref 14–54)
ANION GAP: 10 (ref 5–15)
AST: 30 U/L (ref 15–41)
Alkaline Phosphatase: 153 U/L — ABNORMAL HIGH (ref 38–126)
BILIRUBIN TOTAL: 0.9 mg/dL (ref 0.3–1.2)
BUN: 22 mg/dL — AB (ref 6–20)
CHLORIDE: 98 mmol/L — AB (ref 101–111)
CO2: 28 mmol/L (ref 22–32)
Calcium: 8.6 mg/dL — ABNORMAL LOW (ref 8.9–10.3)
Creatinine, Ser: 4.59 mg/dL — ABNORMAL HIGH (ref 0.44–1.00)
GFR calc Af Amer: 11 mL/min — ABNORMAL LOW (ref >60)
GFR calc non Af Amer: 9 mL/min — ABNORMAL LOW (ref >60)
GLUCOSE: 302 mg/dL — AB (ref 65–99)
POTASSIUM: 3.9 mmol/L (ref 3.5–5.1)
SODIUM: 136 mmol/L (ref 135–145)
TOTAL PROTEIN: 6.6 g/dL (ref 6.5–8.1)

## 2016-05-27 LAB — MRSA PCR SCREENING: MRSA by PCR: NEGATIVE

## 2016-05-27 MED ORDER — GUAIFENESIN-DM 100-10 MG/5ML PO SYRP: 15.0000 mL | ORAL_SOLUTION | ORAL | Status: DC | PRN

## 2016-05-27 MED ORDER — RENA-VITE PO TABS
1.0000 | ORAL_TABLET | Freq: Every day | ORAL | Status: DC
  Administered 2016-05-27: 1 via ORAL
  Filled 2016-05-27: qty 1

## 2016-05-27 MED ORDER — INSULIN ASPART 100 UNIT/ML ~~LOC~~ SOLN
0.0000 [IU] | Freq: Three times a day (TID) | SUBCUTANEOUS | Status: DC
  Administered 2016-05-28: 2 [IU] via SUBCUTANEOUS
  Administered 2016-05-28: 1 [IU] via SUBCUTANEOUS

## 2016-05-27 MED ORDER — TACROLIMUS 1 MG PO CAPS
2.0000 mg | ORAL_CAPSULE | Freq: Two times a day (BID) | ORAL | Status: DC
  Administered 2016-05-27 – 2016-05-28 (×3): 2 mg via ORAL
  Filled 2016-05-27 (×4): qty 2

## 2016-05-27 MED ORDER — PANTOPRAZOLE SODIUM 40 MG PO TBEC
40.0000 mg | DELAYED_RELEASE_TABLET | Freq: Every day | ORAL | Status: DC
  Administered 2016-05-27 – 2016-05-28 (×2): 40 mg via ORAL
  Filled 2016-05-27 (×2): qty 1

## 2016-05-27 MED ORDER — WARFARIN - PHARMACIST DOSING INPATIENT: Freq: Every day | Status: DC

## 2016-05-27 MED ORDER — LEVOTHYROXINE SODIUM 50 MCG PO TABS
175.0000 ug | ORAL_TABLET | Freq: Every day | ORAL | Status: DC
  Administered 2016-05-28: 175 ug via ORAL
  Filled 2016-05-27: qty 1

## 2016-05-27 MED ORDER — NITROGLYCERIN 0.4 MG SL SUBL: 0.4000 mg | SUBLINGUAL_TABLET | SUBLINGUAL | Status: DC | PRN

## 2016-05-27 MED ORDER — DARBEPOETIN ALFA 40 MCG/0.4ML IJ SOSY
40.0000 ug | PREFILLED_SYRINGE | INTRAMUSCULAR | Status: DC
  Administered 2016-05-27 – 2016-05-28 (×2): 40 ug via INTRAVENOUS
  Filled 2016-05-27: qty 0.4

## 2016-05-27 MED ORDER — IPRATROPIUM-ALBUTEROL 0.5-2.5 (3) MG/3ML IN SOLN: 3.0000 mL | RESPIRATORY_TRACT | Status: DC | PRN

## 2016-05-27 MED ORDER — CALCITRIOL 0.5 MCG PO CAPS: 0.5000 ug | ORAL_CAPSULE | ORAL | Status: DC

## 2016-05-27 MED ORDER — SODIUM CHLORIDE 0.9 % IV SOLN: 250.0000 mL | INTRAVENOUS | Status: DC | PRN

## 2016-05-27 MED ORDER — METOCLOPRAMIDE HCL 5 MG PO TABS
5.0000 mg | ORAL_TABLET | Freq: Two times a day (BID) | ORAL | Status: DC
  Administered 2016-05-27 – 2016-05-28 (×2): 5 mg via ORAL
  Filled 2016-05-27 (×2): qty 1

## 2016-05-27 MED ORDER — SODIUM CHLORIDE 0.9% FLUSH
3.0000 mL | Freq: Two times a day (BID) | INTRAVENOUS | Status: DC
Start: 2016-05-27 — End: 2016-05-28
  Administered 2016-05-27 (×2): 3 mL via INTRAVENOUS

## 2016-05-27 MED ORDER — DARBEPOETIN ALFA 40 MCG/0.4ML IJ SOSY
PREFILLED_SYRINGE | INTRAMUSCULAR | Status: AC
  Administered 2016-05-27: 40 ug via INTRAVENOUS
  Filled 2016-05-27: qty 0.4

## 2016-05-27 MED ORDER — ONDANSETRON HCL 4 MG/2ML IJ SOLN: 4.0000 mg | Freq: Four times a day (QID) | INTRAMUSCULAR | Status: DC | PRN

## 2016-05-27 MED ORDER — SENNOSIDES-DOCUSATE SODIUM 8.6-50 MG PO TABS
1.0000 | ORAL_TABLET | Freq: Every day | ORAL | Status: DC
  Administered 2016-05-27 – 2016-05-28 (×2): 1 via ORAL
  Filled 2016-05-27 (×2): qty 1

## 2016-05-27 MED ORDER — OXYCODONE HCL 5 MG PO TABS
5.0000 mg | ORAL_TABLET | Freq: Four times a day (QID) | ORAL | Status: DC | PRN
  Administered 2016-05-27: 5 mg via ORAL
  Administered 2016-05-28: 10 mg via ORAL
  Filled 2016-05-27: qty 2
  Filled 2016-05-27: qty 1

## 2016-05-27 MED ORDER — PREGABALIN 75 MG PO CAPS
75.0000 mg | ORAL_CAPSULE | Freq: Every day | ORAL | Status: DC
  Administered 2016-05-27 – 2016-05-28 (×2): 75 mg via ORAL
  Filled 2016-05-27 (×2): qty 1

## 2016-05-27 MED ORDER — BACITRACIN ZINC 500 UNIT/GM EX OINT
TOPICAL_OINTMENT | Freq: Every day | CUTANEOUS | Status: DC
  Administered 2016-05-28: 06:00:00 via TOPICAL
  Filled 2016-05-27: qty 28.35

## 2016-05-27 MED ORDER — WARFARIN - PHYSICIAN DOSING INPATIENT: Freq: Every day | Status: DC

## 2016-05-27 MED ORDER — BOOST / RESOURCE BREEZE PO LIQD
1.0000 | Freq: Three times a day (TID) | ORAL | Status: DC
  Administered 2016-05-27: 1 via ORAL

## 2016-05-27 MED ORDER — WARFARIN SODIUM 2.5 MG PO TABS: 2.5000 mg | ORAL_TABLET | Freq: Every day | ORAL | Status: DC

## 2016-05-27 MED ORDER — METOPROLOL TARTRATE 25 MG PO TABS: 25.0000 mg | ORAL_TABLET | Freq: Two times a day (BID) | ORAL | Status: DC

## 2016-05-27 MED ORDER — METOPROLOL TARTRATE 25 MG PO TABS
25.0000 mg | ORAL_TABLET | Freq: Two times a day (BID) | ORAL | Status: DC
  Administered 2016-05-28: 25 mg via ORAL
  Filled 2016-05-27: qty 1

## 2016-05-27 MED ORDER — WARFARIN SODIUM 2.5 MG PO TABS
2.5000 mg | ORAL_TABLET | Freq: Once | ORAL | Status: AC
  Administered 2016-05-27: 2.5 mg via ORAL
  Filled 2016-05-27: qty 1

## 2016-05-27 MED ORDER — ASPIRIN EC 81 MG PO TBEC
81.0000 mg | DELAYED_RELEASE_TABLET | Freq: Every day | ORAL | Status: DC
  Administered 2016-05-27 – 2016-05-28 (×2): 81 mg via ORAL
  Filled 2016-05-27 (×2): qty 1

## 2016-05-27 MED ORDER — POLYETHYLENE GLYCOL 3350 17 G PO PACK
17.0000 g | PACK | Freq: Two times a day (BID) | ORAL | Status: DC
  Administered 2016-05-27 – 2016-05-28 (×2): 17 g via ORAL
  Filled 2016-05-27 (×2): qty 1

## 2016-05-27 MED ORDER — CALCITRIOL 0.5 MCG PO CAPS
0.5000 ug | ORAL_CAPSULE | ORAL | Status: DC
  Administered 2016-05-28: 0.5 ug via ORAL
  Filled 2016-05-27: qty 1

## 2016-05-27 NOTE — Telephone Encounter (Signed)
Pts daughter called upset, stating that she had to take her mother to the ED and that she needed to cancel her apt for tomorrow. Daughter also asked that I contact Dr Scot Dock and ask him to come by and check on her mother. I sent Dr. Scot Dock a staff message.

## 2016-05-27 NOTE — Consult Note (Addendum)
[facility] Nurse wound consult note Reason for Consult: Consult requested for left stump and right heel.  Pt had surgery with the vascular service recently.  Wound type: Left stump with staples in place, well approximated without fluctuance or drainage. Left outer stump with tightly adhered eschar; 5X1cm. Right heel with deep tissue injury which was noted during a previous admission; 9X6cm darker-colored skin, some loose peeling skin over the top; very tender to the touch. Pressure Injury POA: Yes Dressing procedure/placement/frequency: It is best practice to leave dry stable eschar intact to left stump; please consult vascular surgeon who performed surgery for further assessment and plan of care.  Dry dressing to protect from further injury.  Float right heel to avoid pressure to deep tissue injury.  No topical treatment is indicated at this time for right heel. Please re-consult if further assistance is needed.  Thank-you,  Julien Girt MSN, [place], [place], [place], [place]

## 2016-05-27 NOTE — Evaluation (Signed)
Physical Therapy Evaluation Patient Details Name: [name] MRN: [id number] DOB: Feb 01, 1956 Today's Date: 05/27/2016   History of Present Illness  61 y.o. female with SOB and low O2, placed on O2 and admitted to hosp.  PMHx:  ESRD, CAD, LIMA, AVR, liver transplant, Hep C, COPD, DM, HTN, L BK with staples in.  Clinical Impression  Pt is willing to work with PT despite having just arrived, very motivated and sat up in chair for lunch which was on the way.  Her plan is to follow acutely and progress to CIR when able, as she is home with no gait having been started yet and will potentially need 2 person assist.  Her plan acutely is to work to initiate gait when able and increase strength as tolerated to BLE's along with using care to protect healing L stump.    Follow Up Recommendations CIR    Equipment Recommendations  None recommended by PT (await rehab disposition)    Recommendations for Other Services Rehab consult     Precautions / Restrictions Precautions Precautions: Fall Restrictions Weight Bearing Restrictions: Yes LLE Weight Bearing: Non weight bearing      Mobility  Bed Mobility Overal bed mobility: Needs Assistance Bed Mobility: Supine to Sit     Supine to sit: Min assist     General bed mobility comments: Assisted under her trunk to sit up and then could balance with minor help  Transfers Overall transfer level: Needs assistance Equipment used: 1 person hand held assist Transfers: Sit to/from Stand;Stand Pivot Transfers Sit to Stand: Mod assist Stand pivot transfers: Mod assist       General transfer comment: pt refused walker to pivot to chair and has not stepped yet since her amputation  Ambulation/Gait             General Gait Details: unable  Holiday representative Wheelchair mobility:  (this is her PLOF)  Modified Rankin (Stroke Patients Only)       Balance Overall balance assessment: Needs  assistance;History of Falls (fell with daughter transferring in [place] last week)   Sitting balance-Leahy Scale: Fair   Postural control: Posterior lean Standing balance support: Bilateral upper extremity supported Standing balance-Leahy Scale: Poor                               Pertinent Vitals/Pain Pain Assessment: Faces Faces Pain Scale: Hurts little more Pain Location: L BK amputation Pain Descriptors / Indicators: Sore Pain Intervention(s): Limited activity within patient's tolerance;Monitored during session;Repositioned    Home Living Family/patient expects to be discharged to:: Private residence Living Arrangements: Children Available Help at Discharge: Family;Available PRN/intermittently Type of Home: Mobile home Home Access: Stairs to enter Entrance Stairs-Rails: Right;Left;Can reach both Entrance Stairs-Number of Steps: 5 Home Layout: One level Home Equipment: Bedside commode;Walker - 2 wheels Additional Comments: community is building a ramp    Prior Function Level of Independence: Needs assistance   Gait / Transfers Assistance Needed: used WC to transfer to other rooms then pivoted to the next surface  ADL's / Homemaking Assistance Needed: has daughter to help her        Hand Dominance   Dominant Hand: Right    Extremity/Trunk Assessment   Upper Extremity Assessment Upper Extremity Assessment: Overall WFL for tasks assessed    Lower Extremity Assessment Lower Extremity Assessment: Generalized weakness  Cervical / Trunk Assessment Cervical / Trunk Assessment: Normal  Communication   Communication: No difficulties  Cognition Arousal/Alertness: Awake/alert Behavior During Therapy: WFL for tasks assessed/performed Overall Cognitive Status: Within Functional Limits for tasks assessed                                        General Comments General comments (skin integrity, edema, etc.): Pt is still with staples for LLE as  she has not been back to surgeon yet    Exercises     Assessment/Plan    PT Assessment Patient needs continued PT services  PT Problem List Decreased strength;Decreased range of motion;Decreased activity tolerance;Decreased balance;Decreased mobility;Decreased coordination;Decreased knowledge of use of DME;Decreased safety awareness;Decreased knowledge of precautions;Pain;Decreased skin integrity       PT Treatment Interventions DME instruction;Gait training;Stair training;Functional mobility training;Therapeutic activities;Therapeutic exercise;Balance training;Neuromuscular re-education;Patient/family education    PT Goals (Current goals can be found in the Care Plan section)  Acute Rehab PT Goals Patient Stated Goal: to get stronger  PT Goal Formulation: With patient Time For Goal Achievement: 06/10/16 Potential to Achieve Goals: Good    Frequency Min 3X/week   Barriers to discharge Inaccessible home environment;Decreased caregiver support has a plan for a ramp but not aware of this being there now    Co-evaluation               End of Session Equipment Utilized During Treatment: Gait belt;Oxygen Activity Tolerance: Patient tolerated treatment well;Patient limited by fatigue Patient left: in chair;with call bell/phone within reach;with chair alarm set;with nursing/sitter in room Nurse Communication: Mobility status PT Visit Diagnosis: Muscle weakness (generalized) (M62.81);History of falling (Z91.81)    Time: 4098-1191 PT Time Calculation (min) (ACUTE ONLY): 32 min   Charges:   PT Evaluation $PT Eval Moderate Complexity: 1 Procedure PT Treatments $Therapeutic Activity: 8-22 mins   PT G Codes:   PT G-Codes **NOT FOR INPATIENT CLASS** Functional Assessment Tool Used: AM-PAC 6 Clicks Basic Mobility;Clinical judgement Functional Limitation: Mobility: Walking and moving around Mobility: Walking and Moving Around Current Status (Y7829): At least 60 percent but less  than 80 percent impaired, limited or restricted Mobility: Walking and Moving Around Goal Status [phone]): At least 1 percent but less than 20 percent impaired, limited or restricted    Ramond Dial 05/27/2016, 12:13 PM   Mee Hives, PT MS Acute Rehab Dept. Number: [place]

## 2016-05-27 NOTE — Progress Notes (Signed)
CRITICAL VALUE ALERT  Critical value received:  troponin  Date of notification:  05/27/16  Time of notification:  1500  Critical value read back:yes  Nurse who received alert:  Honor Loh RN  MD notified (1st page):  Text page  Dr Marily Memos   Time of first page:  [phone]

## 2016-05-27 NOTE — H&P (Signed)
History and Physical    [name] XBM:[id number] DOB: [birth year] DOA: 05/27/2016   PCP: [name], MD   Patient coming from:  Home    Chief Complaint: SHortness of breath   HPI: [name] is a 61 y.o. female with extensive medical history including HTN, ESRD on HD MWF (Dr. Justin Mend) with last dialysis on 05/26/2016 without any complications, DM not on insulin, Anemia of chronic disease, recent LBKA amputation due to limb ischemia  D/c from hospital on 4/10 with phantom limb pain, S/p Liver transplant,  CHF with recent 2-D echo in February 2018 showing EF 40 to 02%, grade 3 diastolic dysfunction, COPD not on home O2, s/p AVR on chronic coumadin, presenting to [facility] with acute episode of shortness of breath, without chest pain. On presentation, her saturations were in the 80s. She was placed on 2 L of oxygen, be at nasal cannula, with improvement of her symptoms. The patient was afebrile, with stable vitals. She was transferred to [facility] to a telemetry bed for the management of her symptoms,Denies fevers, chills, night sweats, vision changes Denies lower extremity swelling. Denies nausea, heartburn or change in bowel habits. Denies abdominal pain. Appetite is normal. Denies any bleeding issues such as epistaxis, hematemesis,or hematochezia. Ambulating  With some difficulty due to recent amputation   ED Course:  There were no vitals taken for this visit.   Her pro-BNP was 109,000, troponin 0.12 chest x-ray cardiomegaly, with vascular congestion, and small effusions. Labs are pending including CBC, CMET   Review of Systems: As per HPI otherwise 10 point review of systems negative.   Past Medical History:  Diagnosis Date  . Anemia    takes Folic Acid daily  . Anxiety   . Aortic stenosis   . Arthritis    "left hand, back" (08/30/2012)  . Asthma   . CAD (coronary artery disease)   . CAD (coronary artery disease) Jan. 2015   Cath: 20% LAD, 50% D1; s/p LIMA-LAD  .  Chest pain 03/06/2015  . Chronic back pain   . Chronic bronchitis ([place])    "q yr w/season changes" (08/30/2012)  . Chronic constipation    takes MIralax and Colace daily  . COPD (chronic obstructive pulmonary disease) ([place])   . Depression    takes Cymbalta for "severe" depression  . End stage renal disease on dialysis [facility]) 02/27/2011   Kidneys shut down at time of liver transplant in Sept 2011 at [facility] in [place], she has been on HD ever since.  Dialyzes at [facility] HD on TTS schedule.  Had L forearm graft used 10 months then removed Dec 2012 due to suspected infection.  A right upper arm AVG was placed Dec 2012 but she developed steal symptoms acutely and it was ligated the same day.  Never had an AV fistula due to small veins.  Now has L thigh AVG put in Jan 2013, has not clotted to date.    [name] GERD (gastroesophageal reflux disease)    takes Omeprazole daily  . Headache    "at least monthly" (08/30/2012)  . Hepatitis C   . History of blood transfusion    "several" (08/30/2012)  . Hypertension    takes Metoprolol and Lisinopril daily, sees Dr Bea Graff  . Hypothyroidism    takes Synthroid daily  . Migraine    "last migraine was in 2013" (08/30/2012)  . Neuromuscular disorder ([place])    carpal tunnel in right hand  . Obesity   .  Peripheral vascular disease (HCC) hands and legs  . Pneumonia    "today and several times before" (08/30/2012)  . 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 S/P unilateral BKA (below knee amputation), left ([place]) 04/30/2016  . SVT (supraventricular tachycardia) ([place]) 06/09/14  . Tobacco abuse   . Type II diabetes mellitus (HCC)    Levemir 2units daily if > 150    Past Surgical History:  Procedure Laterality Date  . AMPUTATION Left 04/24/2016   Procedure: AMPUTATION BELOW KNEE;  Surgeon: Angelia Mould, MD;  Location: [place];   Service: Vascular;  Laterality: Left;  . AORTIC VALVE REPLACEMENT N/A 03/15/2013   AVR; Surgeon: Ivin Poot, MD;  Location: [facility] OR; Open Heart Surgery;  37mm[employer] mechanical prosthesis, top hat valve  . ARTERIOVENOUS GRAFT PLACEMENT Left 10/03/10    forearm  . AV FISTULA PLACEMENT  01/29/2011   Procedure: INSERTION OF ARTERIOVENOUS (AV) GORE-TEX GRAFT ARM;  Surgeon: Elam Dutch, MD;  Location: [facility] OR;  Service: Vascular;  Laterality: Right;  . AV FISTULA PLACEMENT  03/10/2011   Procedure: INSERTION OF ARTERIOVENOUS (AV) GORE-TEX GRAFT THIGH;  Surgeon: Elam Dutch, MD;  Location: [place];  Service: Vascular;  Laterality: Left;  . [place] REMOVAL  12/23/2010   Procedure: REMOVAL OF ARTERIOVENOUS GORETEX GRAFT ([place]);  Surgeon: Elam Dutch, MD;  Location: [place];  Service: Vascular;  Laterality: Left;  procedure started @1736 -1852  . CHOLECYSTECTOMY  1993  . CORONARY ARTERY BYPASS GRAFT N/A 03/15/2013   Procedure: CORONARY ARTERY BYPASS GRAFTING (CABG) times one using left internal mammary artery.;  Surgeon: Ivin Poot, MD;  Location: [place];  Service: Open Heart Surgery;  Laterality: N/A;  POSS CABG X 1  . CYSTOSCOPY  1990's  . INSERTION OF DIALYSIS CATHETER  12/23/2010   Procedure: INSERTION OF DIALYSIS CATHETER;  Surgeon: Elam Dutch, MD;  Location: [place];  Service: Vascular;  Laterality: Right;  Right Internal Jugular 28cm dialysis catheter insertion procedure time 1701-1720   . INSERTION OF DIALYSIS CATHETER Right 03/11/2016   Procedure: INSERTION OF DIALYSIS CATHETER;  Surgeon: Angelia Mould, MD;  Location: [place];  Service: Vascular;  Laterality: Right;  . INTRAOPERATIVE TRANSESOPHAGEAL ECHOCARDIOGRAM N/A 03/15/2013   Procedure: INTRAOPERATIVE TRANSESOPHAGEAL ECHOCARDIOGRAM;  Surgeon: Ivin Poot, MD;  Location: [place];  Service: Open Heart Surgery;  Laterality: N/A;  . LEFT HEART CATHETERIZATION WITH CORONARY ANGIOGRAM N/A 07/29/2012   Procedure: LEFT HEART  CATHETERIZATION WITH CORONARY ANGIOGRAM;  Surgeon: Troy Sine, MD;  Location: [facility] CATH LAB;  Service: Cardiovascular;  Laterality: N/A;  . LEFT HEART CATHETERIZATION WITH CORONARY ANGIOGRAM N/A 03/10/2013   Procedure: LEFT HEART CATHETERIZATION WITH CORONARY ANGIOGRAM;  Surgeon: Troy Sine, MD;  Location: [facility] CATH LAB;  Service: Cardiovascular;  Laterality: N/A;  . LEFT HEART CATHETERIZATION WITH CORONARY/GRAFT ANGIOGRAM N/A 12/24/2011   Procedure: LEFT HEART CATHETERIZATION WITH Beatrix Fetters;  Surgeon: [name], MD;  Location: [facility] CATH LAB;  Service: Cardiovascular;  Laterality: N/A;  . LEFT HEART CATHETERIZATION WITH CORONARY/GRAFT ANGIOGRAM N/A 12/16/2013   Procedure: LEFT HEART CATHETERIZATION WITH Beatrix Fetters;  Surgeon: Troy Sine, MD;  Location: [facility] CATH LAB;  Service: Cardiovascular;  Laterality: N/A;  . LIGATION ARTERIOVENOUS GORTEX GRAFT Left 03/11/2016   Procedure: LIGATION THIGH ARTERIOVENOUS GORTEX GRAFT;  Surgeon: Harrell Gave  Nicole Cella, MD;  Location: [place];  Service: Vascular;  Laterality: Left;  . LIVER TRANSPLANT  10/25/2009   sees Dr Ferol Luz 1 every 6 months, saw last in Dec 2013. [name] Coord [phone]  . PERIPHERAL VASCULAR CATHETERIZATION N/A 11/06/2015   Procedure: Abdominal Aortogram;  Surgeon: Serafina Mitchell, MD;  Location: [place] CV LAB;  Service: Cardiovascular;  Laterality: N/A;  . PERIPHERAL VASCULAR CATHETERIZATION N/A 11/06/2015   Procedure: Lower Extremity Angiography;  Surgeon: Serafina Mitchell, MD;  Location: [place] CV LAB;  Service: Cardiovascular;  Laterality: N/A;  . PERIPHERAL VASCULAR CATHETERIZATION  11/06/2015   Procedure: Peripheral Vascular Atherectomy;  Surgeon: Serafina Mitchell, MD;  Location: [place] CV LAB;  Service: Cardiovascular;;  Left Superficial femoral  . SHUNTOGRAM Left 05/15/2014   Procedure: SHUNTOGRAM;  Surgeon: Conrad Riverview, MD;  Location: [facility] CATH LAB;  Service: Cardiovascular;   Laterality: Left;  . SMALL INTESTINE SURGERY  90's  . SPINAL GROWTH RODS  2010   "put 2 metal rods in my back; they had detetriorated" (08/30/2012)  . THROMBECTOMY    . THROMBECTOMY AND REVISION OF ARTERIOVENTOUS (AV) GORETEX  GRAFT Left 03/30/2014  . THROMBECTOMY AND REVISION OF ARTERIOVENTOUS (AV) GORETEX  GRAFT Left 03/30/2014   Procedure: THROMBECTOMY AND REVISION OF ARTERIOVENTOUS (AV) GORETEX  GRAFT;  Surgeon: Conrad Kimball, MD;  Location: [place];  Service: Vascular;  Laterality: Left;  . TUBAL LIGATION  1990's    Social History Social History   Social History  . Marital status: Divorced    Spouse name: N/A  . Number of children: N/A  . Years of education: N/A   Occupational History  . Not on file.   Social History Main Topics  . Smoking status: Current Every Day Smoker    Packs/day: 0.50    Years: 40.00    Types: Cigarettes  . Smokeless tobacco: Never Used     Comment: Almost 1 pk per day  . Alcohol use No  . Drug use: No  . Sexual activity: No   Other Topics Concern  . Not on file   Social History Narrative  . No narrative on file     Allergies  Allergen Reactions  . Acetaminophen Other (See Comments)    Liver transplant recipient   . Morphine Itching and Other (See Comments)    hallucinate  . Codeine Itching  . Mirtazapine Other (See Comments)    hallucination    Family History  Problem Relation Age of Onset  . Cancer Mother   . Diabetes Mother   . Hypertension Mother   . Stroke Mother   . Cancer Father   . Anesthesia problems Neg Hx   . Hypotension Neg Hx   . Malignant hyperthermia Neg Hx   . Pseudochol deficiency Neg Hx       Prior to Admission medications   Medication Sig Start Date End Date Taking? Authorizing Provider  aspirin EC 81 MG tablet Take 81 mg by mouth daily.    Historical Provider, MD  calcitRIOL (ROCALTROL) 0.5 MCG capsule Take 1 capsule (0.5 mcg total) by mouth every Monday, Wednesday, and Friday with hemodialysis. 04/25/16    Reyne Dumas, MD  collagenase (SANTYL) ointment Cleanse area daily. Then apply santyl and cover cover with damp to dry dressing. 05/20/16   Bary Leriche, PA-C  feeding supplement (BOOST / RESOURCE BREEZE) LIQD Take 1 Container by mouth 3 (three) times daily between meals. 04/25/16   Reyne Dumas, MD  guaiFENesin-dextromethorphan (ROBITUSSIN DM) 100-10 MG/5ML syrup Take 15 mLs by mouth every 4 (four) hours as needed for cough. 04/25/16   Reyne Dumas, MD  HYDROmorphone (DILAUDID) 2 MG tablet Take 1 tablet (2 mg total) by mouth at bedtime as needed for severe pain. 05/20/16   Bary Leriche, PA-C  levothyroxine (SYNTHROID, LEVOTHROID) 175 MCG tablet TAKE 1 TABLET (175 MCG TOTAL) BY MOUTH DAILY BEFORE BREAKFAST. 04/01/16   Historical Provider, MD  methocarbamol (ROBAXIN) 500 MG tablet Take 0.5 tablets (250 mg total) by mouth every 6 (six) hours as needed for muscle spasms. 05/20/16   Bary Leriche, PA-C  metoCLOPramide (REGLAN) 5 MG tablet Take 1 tablet (5 mg total) by mouth 2 (two) times daily before a meal. Decrease to once a day before breakfast after a couple of weeks. 05/20/16   Bary Leriche, PA-C  metoprolol tartrate (LOPRESSOR) 25 MG tablet Take 1 tablet (25 mg total) by mouth 2 (two) times daily. 05/20/16   Bary Leriche, PA-C  multivitamin (RENA-VIT) TABS tablet Take 1 tablet by mouth at bedtime. 05/20/16   Bary Leriche, PA-C  nitroGLYCERIN (NITROSTAT) 0.4 MG SL tablet Place 1 tablet (0.4 mg total) under the tongue every 5 (five) minutes as needed for chest pain. 03/20/14   Silver Huguenin Elgergawy, MD  ondansetron (ZOFRAN ODT) 4 MG disintegrating tablet Take 1 tablet (4 mg total) by mouth every 8 (eight) hours as needed for nausea. 05/20/16   Bary Leriche, PA-C  oxyCODONE (OXY IR/ROXICODONE) 5 MG immediate release tablet Take 1 tablet (5 mg total) by mouth 2 (two) times daily as needed for severe pain or breakthrough pain. 05/20/16   Ivan Anchors Love, PA-C  pantoprazole (PROTONIX) 40 MG tablet Take 1 tablet (40 mg  total) by mouth daily. 05/20/16   Ivan Anchors Love, PA-C  polyethylene glycol (MIRALAX / GLYCOLAX) packet Take 17 g by mouth 2 (two) times daily. 05/20/16   Bary Leriche, PA-C  pregabalin (LYRICA) 75 MG capsule Take 1 capsule (75 mg total) by mouth daily. 05/21/16   Ivan Anchors Love, PA-C  senna-docusate (SENOKOT-S) 8.6-50 MG tablet Take 1 tablet by mouth daily.    Historical Provider, MD  tacrolimus (PROGRAF) 1 MG capsule Take 2 capsules (2 mg total) by mouth 2 (two) times daily. Take 2 capsules twice daily. 11/29/15   Maryann Mikhail, DO  traMADol (ULTRAM) 50 MG tablet Take 1 tablet (50 mg total) by mouth every 6 (six) hours as needed for moderate pain. 05/20/16   Bary Leriche, PA-C  warfarin (COUMADIN) 2.5 MG tablet Take 1 tablet (2.5 mg total) by mouth daily at 6 PM. 05/20/16   Bary Leriche, PA-C    Physical Exam: BP (!) 121/59 (BP Location: Right Arm)   Pulse 72   Temp 98.1 F (36.7 C) (Oral)   Resp 16   Wt 56.7 kg (125 lb 1.6 oz)   SpO2 100%   BMI 21.47 kg/m   Constitutional: NAD, calm, comfortable  Eyes: PERRL, lids and conjunctivae normal ENMT: Mucous membranes are moist, no  lesions  Neck: normal, supple, no masses, no thyromegaly Respiratory decreased breath sounds at the bases, L>R, no wheezing, no crackles. Normal respiratory effort Wearing 2 L O2 [place]  Cardiovascular: Regular rate and rhythm, no murmurs, rubs or gallops. No extremity edema. 2+ pedal pulses. No carotid bruits.  Abdomen: Soft, non tender, No hepatosplenomegaly. Bowel sounds positive.  Musculoskeletal: no clubbing / cyanosis. L BKA site tender but without drainage or  odor. Staples present  Skin: no jaundice, staples at [facility] as above. R foot with some areas or desquamation, but no open lesion. Known sacral wound  Neurologic: Sensation intact  Strength equal in all extremities   Labs on Admission: I have personally reviewed following labs and imaging studies  CBC: No results for input(s): WBC, NEUTROABS, HGB, HCT,  MCV, PLT in the last 168 hours.  Basic Metabolic Panel: No results for input(s): NA, K, CL, CO2, GLUCOSE, BUN, CREATININE, CALCIUM, MG, PHOS in the last 168 hours.  GFR: Estimated Creatinine Clearance: 7.2 mL/min (A) (by C-G formula based on SCr of 7.08 mg/dL (H)).  Liver Function Tests: No results for input(s): AST, ALT, ALKPHOS, BILITOT, PROT, ALBUMIN in the last 168 hours. No results for input(s): LIPASE, AMYLASE in the last 168 hours. No results for input(s): AMMONIA in the last 168 hours.  Coagulation Profile:  Recent Labs Lab 05/22/16  INR 3.4    Cardiac Enzymes: No results for input(s): CKTOTAL, CKMB, CKMBINDEX, TROPONINI in the last 168 hours.  BNP (last 3 results) No results for input(s): PROBNP in the last 8760 hours.  HbA1C: No results for input(s): HGBA1C in the last 72 hours.  CBG:  Recent Labs Lab 05/20/16 1202  GLUCAP 135*    Lipid Profile: No results for input(s): CHOL, HDL, LDLCALC, TRIG, CHOLHDL, LDLDIRECT in the last 72 hours.  Thyroid Function Tests: No results for input(s): TSH, T4TOTAL, FREET4, T3FREE, THYROIDAB in the last 72 hours.  Anemia Panel: No results for input(s): VITAMINB12, FOLATE, FERRITIN, TIBC, IRON, RETICCTPCT in the last 72 hours.  Urine analysis:    Component Value Date/Time   COLORURINE YELLOW 11/02/2012 0428   APPEARANCEUR CLOUDY (A) 11/02/2012 0428   LABSPEC 1.011 11/02/2012 0428   PHURINE 8.0 11/02/2012 0428   GLUCOSEU 250 (A) 11/02/2012 0428   HGBUR LARGE (A) 11/02/2012 0428   BILIRUBINUR NEGATIVE 11/02/2012 0428   KETONESUR NEGATIVE 11/02/2012 0428   PROTEINUR >300 (A) 11/02/2012 0428   UROBILINOGEN 0.2 11/02/2012 0428   NITRITE NEGATIVE 11/02/2012 0428   LEUKOCYTESUR NEGATIVE 11/02/2012 0428    Sepsis Labs: @LABRCNTIP (procalcitonin:4,lacticidven:4) )No results found for this or any previous visit (from the past 240 hour(s)).   Radiological Exams on Admission: No results found.  EKG: Independently  reviewed.  Assessment/Plan Active Problems:   Acute respiratory failure (HCC)   Acute on chronic systolic CHF (congestive heart failure) (HCC)   Hypothyroidism   Type 2 diabetes mellitus with chronic kidney disease (HCC)   Anemia due to chronic renal failure    Hypertensive heart disease   GERD   End stage renal disease on dialysis (HCC)   CAD (coronary artery disease), native coronary artery   History of prosthetic aortic valve replacement   Dyslipidemia-LDL 104, not on statin with Hx of liver transplant   Chronic combined systolic and diastolic congestive heart failure (HCC)   COPD (chronic obstructive pulmonary disease) (HCC)   Tobacco abuse   Chronic anticoagulation w/ coumadin, goal INR 2.5-3.0 w/ AVR   Respiratory failure (HCC)   Chronic hepatitis C without hepatic coma (HCC)   H/O unilateral nephrectomy   History of migraine   ESRD on dialysis ([place])    Acute hypoxic respiratory failure with hypoxia  likely secondary to acute on chronic systolic and diastolic CHF exacerbation, less likely due to COPD .CXR  cardiomegaly, with vascular congestion, and small effusions.  Pro BNP 109,000 . Tn was 0.12 in the setting of oxygen demand Last 2 D echo  Osats now in the 90s on 2 L and patient in no acute respiratory distress   VSS  Weight 125 lbs    Last 2-D echo in February 2018 showing EF 40 to 33%, grade 3 diastolic dysfunction, -  Telemetry CHForder set O2   Cycle troponins/ EKG   Daily weights and strict I/O Continue CHF meds  Will defer to Nephrology diuresis/fluid overload issues, as she may undergo dialysis   History of COPD  with admission for hypoxic respiratory failure unlikely due to exacerbation of same / History of asthma not on inhalers. Patient not on O2 at home   WBC pending. No acute distress  Afebrile   VSS   Currently on O2 at 2l as above  Duonebs  Neb prn   Mucinex O2 prn CBC in am Incentive spirometry CXR in am     ESRD on HD MWF, last on 4/16. Primary  MD Dr. Justin Mend Patient s/p nephrectomy   , Cr  Baseline 5 . New CMET pending  Nephrology involved, Dr Jonnie Finner. notified.  Renal Diet. Continue Rocaltrol . Renavit and Prograf  Other plans as per Nephrology Check CMET in am   Hypertension BP   121/59   Pulse 72   Controlled Hold  home anti-hypertensive medications in view patient may undergo dialysis today.    s/p AVR on Chronic Coumadin, goal 2.5-3.5, current INR 3.4  Continue Coumadin per Pharmacy   Chronic hep C, s/p Liver transplant  Continue Prograf   GERD, no acute symptoms Continue PPI  Type II Diabetes Current blood sugar level is 135. Patjent diet controlled  Lab Results  Component Value Date   HGBA1C 7.5 (H) 03/05/2016  SSI Heart healthy carb modified diet.  Anemia of chronic disease Hemoglobin on admission pending  . Baseline Hb 9-10    Repeat CBC in am No transfusion indicated at this time   Hypothyroidism: Continue home Synthroid  S/p  LBKA amputation due to limb ischemia  D/c from hospital on 4/10 with phamtom limb pain  Continue pain meds, and Lyrica   PT/OT consult   Known sacral wound wound care by nursing   DVT prophylaxis:   Coumadin   Code Status:    Partial (DNI)  Family Communication:  Discussed with patient Disposition Plan: Expect patient to be discharged to home after condition improves Consults called:   Renal, Dr. Jonnie Finner  Admission status:Tele Obs   Sharene Butters E, PA-C [facility]   05/27/2016, 9:53 AM

## 2016-05-27 NOTE — Progress Notes (Signed)
Rehab Admissions Coordinator Note:  Patient was screened by Cleatrice Burke for appropriateness for an Inpatient Acute Rehab Consult per PT recommendation . Noted pt admitted today and observation status. At this time, we are recommending await further progress and workup before determining rehab venue options.Cleatrice Burke 05/27/2016, 12:51 PM  I can be reached at [phone].

## 2016-05-27 NOTE — Progress Notes (Signed)
VASCULAR SURGERY ASSESSMENT & PLAN:   The patient was admitted with shortness of breath and I was notified and asked to evaluate the patient given her recent left BKA.  POD 33 L BKA. I removed most of her staples although she was having some discomfort so she asked me to stop. The remaining staples can also be removed.  She continues to have some rest pain in the right foot.  I did review her arteriogram from September 2017. At that time, on the right side the common femoral and deep femoral artery are patent. The superficial femoral artery had a mild stenosis at the adductor canal. The popliteal artery was patent. The anterior tibial was the dominant runoff vessel although all 3 tibial vessels were patent down to the ankle. The peroneal and posterior tibial arteries were diminutive in size. I would not expect to be either be a big change in her findings on arteriography as it is only been 6 months. She has a slight crack on her heel if this progresses however we would need to consider repeating her arteriogram. At that time nor really no options for revascularization that would significantly impact the distal perfusion.   SUBJECTIVE:   Continues to have some rest pain of the right foot.  PHYSICAL EXAM:   Vitals:   05/27/16 1403 05/27/16 1409 05/27/16 1430 05/27/16 1500  BP: (!) 162/77 (!) 170/73 (!) 169/79 (!) 154/84  Pulse: 75 74 71 68  Resp: 12     Temp: 98.1 F (36.7 C)     TempSrc: Oral     SpO2: 99%     Weight: 121 lb 4.1 oz (55 kg)      I removed most of the staples from the left BKA. There is some slight eschar on the lateral aspect of the incision but no drainage or erythema to suggest infection. The patient has a fairly brisk posterior tibial and dorsalis pedis signal with the Doppler in the right foot. There is a slight crack in the right heel.  LABS:   Lab Results  Component Value Date   WBC 9.4 05/27/2016   HGB 10.4 (L) 05/27/2016   HCT 31.4 (L) 05/27/2016   MCV  88.0 05/27/2016   PLT 220 05/27/2016   Lab Results  Component Value Date   CREATININE 4.59 (H) 05/27/2016   Lab Results  Component Value Date   INR 3.4 05/22/2016   CBG (last 3)   Recent Labs  05/27/16 0955 05/27/16 1221  GLUCAP 156* 210*    PROBLEM LIST:    Active Problems:   Hypothyroidism   Type 2 diabetes mellitus with chronic kidney disease ([employer])   Anemia due to chronic renal failure    Hypertensive heart disease   GERD   End stage renal disease on dialysis (HCC)   CAD (coronary artery disease), native coronary artery   History of prosthetic aortic valve replacement   Dyslipidemia-LDL 104, not on statin with Hx of liver transplant   Acute respiratory failure (HCC)   Acute on chronic systolic CHF (congestive heart failure) (HCC)   Chronic combined systolic and diastolic congestive heart failure (HCC)   COPD (chronic obstructive pulmonary disease) (HCC)   Tobacco abuse   Chronic anticoagulation w/ coumadin, goal INR 2.5-3.0 w/ AVR   Respiratory failure (HCC)   Chronic hepatitis C without hepatic coma (HCC)   H/O unilateral nephrectomy   History of migraine   ESRD on dialysis ([place])   Acute exacerbation of CHF (congestive heart failure) (  [place])   CURRENT MEDS:   . aspirin EC  81 mg Oral Daily  . [START ON 05/28/2016] calcitRIOL  0.5 mcg Oral Q M,W,F-HD  . darbepoetin (ARANESP) injection - DIALYSIS  40 mcg Intravenous Q Wed-HD  . feeding supplement  1 Container Oral TID BM  . insulin aspart  0-9 Units Subcutaneous TID WC  . [START ON 05/28/2016] levothyroxine  175 mcg Oral QAC breakfast  . metoCLOPramide  5 mg Oral BID AC  . [START ON 05/28/2016] metoprolol tartrate  25 mg Oral BID  . multivitamin  1 tablet Oral QHS  . pantoprazole  40 mg Oral Daily  . polyethylene glycol  17 g Oral BID  . pregabalin  75 mg Oral Daily  . senna-docusate  1 tablet Oral Daily  . sodium chloride flush  3 mL Intravenous Q12H  . tacrolimus  2 mg Oral BID  . warfarin  2.5 mg Oral  q1800  . Warfarin - Physician Dosing Inpatient   Does not apply [place]: [phone] Office: [phone] 05/27/2016

## 2016-05-27 NOTE — Progress Notes (Signed)
ANTICOAGULATION CONSULT NOTE - Initial Consult  Pharmacy Consult for warfarin Indication: mechanical AVR  Allergies  Allergen Reactions  . Acetaminophen Other (See Comments)    Liver transplant recipient   . Morphine Itching and Other (See Comments)    hallucinate  . Codeine Itching  . Mirtazapine Other (See Comments)    hallucination    Patient Measurements: Weight: 113 lb 5.1 oz (51.4 kg) (stood to scale )   Vital Signs: Temp: 98.1 F (36.7 C) (04/17 1845) Temp Source: Oral (04/17 1845) BP: 118/58 (04/17 1845) Pulse Rate: 79 (04/17 1845)  Labs:  Recent Labs  05/27/16 1400 05/27/16 1837  HGB 10.4*  --   HCT 31.4*  --   PLT 220  --   LABPROT  --  29.1*  INR  --  2.68  CREATININE 4.59*  --   TROPONINI 0.16*  --     Estimated Creatinine Clearance: 10.4 mL/min (A) (by C-G formula based on SCr of 4.59 mg/dL (H)).   Assessment: 29 YOF with mechanical AVR. Recent anticoagulant clinic note from 4/12 had an INR of 3.4 on dose of 2.5mg  daily (recently increased from 2mg  daily it appears).  INR drawn this evening in range at 2.68. Hgb 10.4, plts 220- no bleeding noted. Patient received a dose of Aranesp 11mcg IV today in HD, she is not on iron supplementation.  Goal of Therapy:  INR 2.5-3.5 Monitor platelets by anticoagulation protocol: Yes   Plan:  -warfarin 2.5mg  po x1 tonight -daily INR -follow for s/s bleeding  Kwamane Whack D. Raeli Wiens, PharmD, BCPS Clinical Pharmacist Pager: [phone] 05/27/2016 7:37 PM

## 2016-05-27 NOTE — Progress Notes (Signed)
[facility] admissions informed of patient arrival to 6E21 spoke with Jeani Hawking RN

## 2016-05-27 NOTE — Consult Note (Signed)
[facility] Renal Consultation Note  Indication for Consultation:  Management of ESRD/hemodialysis; anemia, hypertension/volume and secondary hyperparathyroidism  HPI: [name] is a 61 y.o. female  [place] ESRD on Chronic HD MWF ( [facility]) HD start 9/2011after Liver Transplant  ([facility])Sec Hep C on prograf/AVR and CABG x 03/15/13 ,hosp [facility] 04/2015 perirenal bleed rq embolization- had to continue coumadin due to mechanical heart valve , hosp [facility] in may 2017 w/ resection of kidney for bleeding./ [facility] 7/13-7/26/17 Spleenectomy sec Infarct/ ,1/24-03/20/16 Sepsis/HCAP, staph coag neg bacteremia, ISCH LLE - ligation of AVG, SVT, recent long admit 04/24/16 LBKA  With CIR  Dc 05/20/16  To home. Now admitted with SOB  Hypoxic (o2 sats 80s) resp. Failure with bilat pl effusion /vascular congestion on cxr. She had complete OP hd yest leaving below edw past 2 txs .(noted at dc was 14kgs below edw pre BKA) She reports awoken with SOB last night . Denies CP, Fevers, Chills,abd pain , DC from BKA ("suppose to see Dr. Scot Dock for Vedia Coffer out this Friday")Currently in Room with Tanaina O2 and sob improved. We will plan for HD UF fluid today and keep on schedule in Am.     Past Medical History:  Diagnosis Date  . Anemia    takes Folic Acid daily  . Anxiety   . Aortic stenosis   . Arthritis    "left hand, back" (08/30/2012)  . Asthma   . CAD (coronary artery disease)   . CAD (coronary artery disease) Jan. 2015   Cath: 20% LAD, 50% D1; s/p LIMA-LAD  . Chest pain 03/06/2015  . Chronic back pain   . Chronic bronchitis ([place])    "q yr w/season changes" (08/30/2012)  . Chronic constipation    takes MIralax and Colace daily  . COPD (chronic obstructive pulmonary disease) ([place])   . Depression    takes Cymbalta for "severe" depression  . End stage renal disease on dialysis [facility]) 02/27/2011   Kidneys shut down at time of liver transplant in Sept 2011 at [facility] in [place], she has been on HD ever since.  Dialyzes at  [facility] HD on TTS schedule.  Had L forearm graft used 10 months then removed Dec 2012 due to suspected infection.  A right upper arm AVG was placed Dec 2012 but she developed steal symptoms acutely and it was ligated the same day.  Never had an AV fistula due to small veins.  Now has L thigh AVG put in Jan 2013, has not clotted to date.    [name] GERD (gastroesophageal reflux disease)    takes Omeprazole daily  . Headache    "at least monthly" (08/30/2012)  . Hepatitis C   . History of blood transfusion    "several" (08/30/2012)  . Hypertension    takes Metoprolol and Lisinopril daily, sees Dr Bea Graff  . Hypothyroidism    takes Synthroid daily  . Migraine    "last migraine was in 2013" (08/30/2012)  . Neuromuscular disorder ([place])    carpal tunnel in right hand  . Obesity   . Peripheral vascular disease (HCC) hands and legs  . Pneumonia    "today and several times before" (08/30/2012)  . 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 S/P unilateral BKA (below knee amputation), left ([place]) 04/30/2016  . SVT (supraventricular tachycardia) (  [place]) 06/09/14  . Tobacco abuse   . Type II diabetes mellitus (HCC)    Levemir 2units daily if > 150    Past Surgical History:  Procedure Laterality Date  . AMPUTATION Left 04/24/2016   Procedure: AMPUTATION BELOW KNEE;  Surgeon: Angelia Mould, MD;  Location: [place];  Service: Vascular;  Laterality: Left;  . AORTIC VALVE REPLACEMENT N/A 03/15/2013   AVR; Surgeon: Ivin Poot, MD;  Location: [facility] OR; Open Heart Surgery;  42mm[employer] mechanical prosthesis, top hat valve  . ARTERIOVENOUS GRAFT PLACEMENT Left 10/03/10    forearm  . AV FISTULA PLACEMENT  01/29/2011   Procedure: INSERTION OF ARTERIOVENOUS (AV) GORE-TEX GRAFT ARM;  Surgeon: Elam Dutch, MD;  Location: [facility] OR;  Service: Vascular;  Laterality: Right;  . AV  FISTULA PLACEMENT  03/10/2011   Procedure: INSERTION OF ARTERIOVENOUS (AV) GORE-TEX GRAFT THIGH;  Surgeon: Elam Dutch, MD;  Location: [place];  Service: Vascular;  Laterality: Left;  . [place] REMOVAL  12/23/2010   Procedure: REMOVAL OF ARTERIOVENOUS GORETEX GRAFT ([place]);  Surgeon: Elam Dutch, MD;  Location: [place];  Service: Vascular;  Laterality: Left;  procedure started @1736 -1852  . CHOLECYSTECTOMY  1993  . CORONARY ARTERY BYPASS GRAFT N/A 03/15/2013   Procedure: CORONARY ARTERY BYPASS GRAFTING (CABG) times one using left internal mammary artery.;  Surgeon: Ivin Poot, MD;  Location: [place];  Service: Open Heart Surgery;  Laterality: N/A;  POSS CABG X 1  . CYSTOSCOPY  1990's  . INSERTION OF DIALYSIS CATHETER  12/23/2010   Procedure: INSERTION OF DIALYSIS CATHETER;  Surgeon: Elam Dutch, MD;  Location: [place];  Service: Vascular;  Laterality: Right;  Right Internal Jugular 28cm dialysis catheter insertion procedure time 1701-1720   . INSERTION OF DIALYSIS CATHETER Right 03/11/2016   Procedure: INSERTION OF DIALYSIS CATHETER;  Surgeon: Angelia Mould, MD;  Location: [place];  Service: Vascular;  Laterality: Right;  . INTRAOPERATIVE TRANSESOPHAGEAL ECHOCARDIOGRAM N/A 03/15/2013   Procedure: INTRAOPERATIVE TRANSESOPHAGEAL ECHOCARDIOGRAM;  Surgeon: Ivin Poot, MD;  Location: [place];  Service: Open Heart Surgery;  Laterality: N/A;  . LEFT HEART CATHETERIZATION WITH CORONARY ANGIOGRAM N/A 07/29/2012   Procedure: LEFT HEART CATHETERIZATION WITH CORONARY ANGIOGRAM;  Surgeon: Troy Sine, MD;  Location: [facility] CATH LAB;  Service: Cardiovascular;  Laterality: N/A;  . LEFT HEART CATHETERIZATION WITH CORONARY ANGIOGRAM N/A 03/10/2013   Procedure: LEFT HEART CATHETERIZATION WITH CORONARY ANGIOGRAM;  Surgeon: Troy Sine, MD;  Location: [facility] CATH LAB;  Service: Cardiovascular;  Laterality: N/A;  . LEFT HEART CATHETERIZATION WITH CORONARY/GRAFT ANGIOGRAM N/A 12/24/2011   Procedure: LEFT HEART  CATHETERIZATION WITH Beatrix Fetters;  Surgeon: [name], MD;  Location: [facility] CATH LAB;  Service: Cardiovascular;  Laterality: N/A;  . LEFT HEART CATHETERIZATION WITH CORONARY/GRAFT ANGIOGRAM N/A 12/16/2013   Procedure: LEFT HEART CATHETERIZATION WITH Beatrix Fetters;  Surgeon: Troy Sine, MD;  Location: [facility] CATH LAB;  Service: Cardiovascular;  Laterality: N/A;  . LIGATION ARTERIOVENOUS GORTEX GRAFT Left 03/11/2016   Procedure: LIGATION THIGH ARTERIOVENOUS GORTEX GRAFT;  Surgeon: Angelia Mould, MD;  Location: [place];  Service: Vascular;  Laterality: Left;  . LIVER TRANSPLANT  10/25/2009   sees Dr Ferol Luz 1 every 6 months, saw last in Dec 2013. [name] Coord [phone]  . PERIPHERAL VASCULAR CATHETERIZATION N/A 11/06/2015   Procedure: Abdominal Aortogram;  Surgeon: Serafina Mitchell, MD;  Location: [place] CV LAB;  Service: Cardiovascular;  Laterality: N/A;  . PERIPHERAL VASCULAR CATHETERIZATION N/A  11/06/2015   Procedure: Lower Extremity Angiography;  Surgeon: Serafina Mitchell, MD;  Location: [place] CV LAB;  Service: Cardiovascular;  Laterality: N/A;  . PERIPHERAL VASCULAR CATHETERIZATION  11/06/2015   Procedure: Peripheral Vascular Atherectomy;  Surgeon: Serafina Mitchell, MD;  Location: [place] CV LAB;  Service: Cardiovascular;;  Left Superficial femoral  . SHUNTOGRAM Left 05/15/2014   Procedure: SHUNTOGRAM;  Surgeon: Conrad Harmon, MD;  Location: [facility] CATH LAB;  Service: Cardiovascular;  Laterality: Left;  . SMALL INTESTINE SURGERY  90's  . SPINAL GROWTH RODS  2010   "put 2 metal rods in my back; they had detetriorated" (08/30/2012)  . THROMBECTOMY    . THROMBECTOMY AND REVISION OF ARTERIOVENTOUS (AV) GORETEX  GRAFT Left 03/30/2014  . THROMBECTOMY AND REVISION OF ARTERIOVENTOUS (AV) GORETEX  GRAFT Left 03/30/2014   Procedure: THROMBECTOMY AND REVISION OF ARTERIOVENTOUS (AV) GORETEX  GRAFT;  Surgeon: Conrad Southport, MD;  Location: [place];  Service: Vascular;   Laterality: Left;  . TUBAL LIGATION  1990's      Family History  Problem Relation Age of Onset  . Cancer Mother   . Diabetes Mother   . Hypertension Mother   . Stroke Mother   . Cancer Father   . Anesthesia problems Neg Hx   . Hypotension Neg Hx   . Malignant hyperthermia Neg Hx   . Pseudochol deficiency Neg Hx       reports that she has been smoking Cigarettes.  She has a 20.00 pack-year smoking history. She has never used smokeless tobacco. She reports that she does not drink alcohol or use drugs.   Allergies  Allergen Reactions  . Acetaminophen Other (See Comments)    Liver transplant recipient   . Morphine Itching and Other (See Comments)    hallucinate  . Codeine Itching  . Mirtazapine Other (See Comments)    hallucination    Prior to Admission medications   Medication Sig Start Date End Date Taking? Authorizing Provider  allopurinol (ZYLOPRIM) 100 MG tablet Take 100 mg by mouth daily. 04/16/16  Yes Historical Provider, MD  aspirin EC 81 MG tablet Take 81 mg by mouth daily.   Yes Historical Provider, MD  calcitRIOL (ROCALTROL) 0.5 MCG capsule Take 1 capsule (0.5 mcg total) by mouth every Monday, Wednesday, and Friday with hemodialysis. 04/25/16  Yes Reyne Dumas, MD  HYDROmorphone (DILAUDID) 2 MG tablet Take 1 tablet (2 mg total) by mouth at bedtime as needed for severe pain. 05/20/16  Yes Ivan Anchors Love, PA-C  levothyroxine (SYNTHROID, LEVOTHROID) 175 MCG tablet TAKE 1 TABLET (175 MCG TOTAL) BY MOUTH DAILY BEFORE BREAKFAST. 04/01/16  Yes Historical Provider, MD  methocarbamol (ROBAXIN) 500 MG tablet Take 0.5 tablets (250 mg total) by mouth every 6 (six) hours as needed for muscle spasms. 05/20/16  Yes Ivan Anchors Love, PA-C  metoCLOPramide (REGLAN) 5 MG tablet Take 1 tablet (5 mg total) by mouth 2 (two) times daily before a meal. Decrease to once a day before breakfast after a couple of weeks. 05/20/16  Yes Ivan Anchors Love, PA-C  metoprolol tartrate (LOPRESSOR) 25 MG tablet Take 1  tablet (25 mg total) by mouth 2 (two) times daily. 05/20/16  Yes Ivan Anchors Love, PA-C  multivitamin (RENA-VIT) TABS tablet Take 1 tablet by mouth at bedtime. 05/20/16  Yes Ivan Anchors Love, PA-C  ondansetron (ZOFRAN ODT) 4 MG disintegrating tablet Take 1 tablet (4 mg total) by mouth every 8 (eight) hours as needed for nausea. 05/20/16  Yes Olin Hauser  S Love, PA-C  oxyCODONE (OXY IR/ROXICODONE) 5 MG immediate release tablet Take 1 tablet (5 mg total) by mouth 2 (two) times daily as needed for severe pain or breakthrough pain. 05/20/16  Yes Ivan Anchors Love, PA-C  pantoprazole (PROTONIX) 40 MG tablet Take 1 tablet (40 mg total) by mouth daily. 05/20/16  Yes Ivan Anchors Love, PA-C  polyethylene glycol (MIRALAX / GLYCOLAX) packet Take 17 g by mouth 2 (two) times daily. 05/20/16  Yes Ivan Anchors Love, PA-C  pregabalin (LYRICA) 75 MG capsule Take 1 capsule (75 mg total) by mouth daily. 05/21/16  Yes Ivan Anchors Love, PA-C  tacrolimus (PROGRAF) 1 MG capsule Take 2 capsules (2 mg total) by mouth 2 (two) times daily. Take 2 capsules twice daily. Patient taking differently: Take 1-2 mg by mouth See admin instructions. Take 2 mg by mouth in the morning and take 1 mg by mouth at bedtime 11/29/15  Yes Maryann Mikhail, DO  traMADol (ULTRAM) 50 MG tablet Take 1 tablet (50 mg total) by mouth every 6 (six) hours as needed for moderate pain. 05/20/16  Yes Ivan Anchors Love, PA-C  warfarin (COUMADIN) 2.5 MG tablet Take 1 tablet (2.5 mg total) by mouth daily at 6 PM. 05/20/16  Yes Ivan Anchors Love, PA-C  collagenase (SANTYL) ointment Cleanse area daily. Then apply santyl and cover cover with damp to dry dressing. Patient not taking: Reported on 05/27/2016 05/20/16   Ivan Anchors Love, PA-C  feeding supplement (BOOST / RESOURCE BREEZE) LIQD Take 1 Container by mouth 3 (three) times daily between meals. 04/25/16   Reyne Dumas, MD  guaiFENesin-dextromethorphan (ROBITUSSIN DM) 100-10 MG/5ML syrup Take 15 mLs by mouth every 4 (four) hours as needed for cough. Patient  not taking: Reported on 05/27/2016 04/25/16   Reyne Dumas, MD  nitroGLYCERIN (NITROSTAT) 0.4 MG SL tablet Place 1 tablet (0.4 mg total) under the tongue every 5 (five) minutes as needed for chest pain. 03/20/14   Silver Huguenin Elgergawy, MD  senna-docusate (SENOKOT-S) 8.6-50 MG tablet Take 1 tablet by mouth daily.    Historical Provider, MD    SEG:BTDVVO chloride, ipratropium-albuterol, nitroGLYCERIN, ondansetron (ZOFRAN) IV  Results for orders placed or performed during the hospital encounter of 05/27/16 (from the past 48 hour(s))  Glucose, capillary     Status: Abnormal   Collection Time: 05/27/16  9:55 AM  Result Value Ref Range   Glucose-Capillary 156 (H) 65 - 99 mg/dL  Glucose, capillary     Status: Abnormal   Collection Time: 05/27/16 12:21 PM  Result Value Ref Range   Glucose-Capillary 210 (H) 65 - 99 mg/dL   .  ROS: see hpi as above   Physical Exam: Vitals:   05/27/16 0957  BP: (!) 121/59  Pulse: 72  Resp: 16  Temp: 98.1 F (36.7 C)     General: alert chronically ill  Appearing  Elderly AAF NAD HEENT: [place], MMM EOMI Neck: supple / pos jvd Heart: RRR , 2/6 sem prostheic v click  Lungs:  decr BS bases/faint rales Abdomen: Bs pos soft NT, ND Extremities:  LBKA with surg stables present,no dc no edema of stump or right lower extrem Skin: no overt rash , warm dry/ no dc from LBKA  Recent surg site Neuro: alert. OX3 ,moves all extrem., No acute focal deficits noted  Dialysis Access: Merwyn Katos cath   Dialysis Orders: Center:  [facility] on MWF . EDW 55(was decr to 54kg after yest hd leaving below edw) HD Bath 2k, 2ca  Time 3hrs  63min Heparin None. Access RIJ p Cath      Calcitriol 0.5 mcg po/HD   Mircera 93mcg  q 2wks (to begin 05/28/16)    Assessment/Plan 1. SOB / Pulmonary edema / acute / chronic systolic/diastolic HF= HD  Today attempt 3 -3.5 l uf as bp allows needs lower edw further  2. ESRD -  HD MWF normal schedule , Check labs pre hd today , HD  Today  For vol as above and in am  for normal schedule  3. Hypertension/volume  - Uf Vol as above/ bp stable pre hd/ on Metoprolol 25 mg  q hs  4. Anemia of ESRD - hgb pending  On Mircera q 2ks due today /no fe  5. Metabolic bone disease -  PO Calcitriol on hd / no binders listed fu ca/phos 6.  s/p AVR on Chronic Coumadin- per admit /pharmacy 7. 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 DM type 2- per admit  9. Chronic hep C, s/p Liver transplant -Continue Prograf    Ernest Haber, PA-C [place] [phone] 05/27/2016, 12:23 PM   Pt seen, examined and agree w A/P as above. ESRD pt here with SOB and sig pulm edema/ R pleural effusion.  She was here recently and dc'd from CIR after BKA, lost a lot of body weight in the hospital, but not sure that we communicated this well to the outpt HD unit since today she is coming here 10-12 kg over her dc weight from 1 week ago (44- 46kg).   Will investigate tomorrow.  For now needs serial HD, get volume down again.  [name] MD [name] pager [phone]   05/27/2016, 4:33 PM

## 2016-05-27 NOTE — Procedures (Signed)
  I was present at this dialysis session, have reviewed the session itself and made  appropriate changes [name] MD [place] pager [phone]   05/27/2016, 4:36 PM

## 2016-05-28 ENCOUNTER — Encounter: Payer: Medicare Other | Admitting: Vascular Surgery

## 2016-05-28 ENCOUNTER — Observation Stay (HOSPITAL_COMMUNITY): Payer: Medicare Other

## 2016-05-28 DIAGNOSIS — Z992 Dependence on renal dialysis: Secondary | ICD-10-CM | POA: Diagnosis not present

## 2016-05-28 DIAGNOSIS — E1122 Type 2 diabetes mellitus with diabetic chronic kidney disease: Secondary | ICD-10-CM

## 2016-05-28 DIAGNOSIS — I5043 Acute on chronic combined systolic (congestive) and diastolic (congestive) heart failure: Secondary | ICD-10-CM | POA: Diagnosis not present

## 2016-05-28 DIAGNOSIS — Z885 Allergy status to narcotic agent status: Secondary | ICD-10-CM | POA: Diagnosis not present

## 2016-05-28 DIAGNOSIS — E1129 Type 2 diabetes mellitus with other diabetic kidney complication: Secondary | ICD-10-CM | POA: Diagnosis not present

## 2016-05-28 DIAGNOSIS — F329 Major depressive disorder, single episode, unspecified: Secondary | ICD-10-CM | POA: Diagnosis present

## 2016-05-28 DIAGNOSIS — N186 End stage renal disease: Secondary | ICD-10-CM | POA: Diagnosis present

## 2016-05-28 DIAGNOSIS — Z89512 Acquired absence of left leg below knee: Secondary | ICD-10-CM | POA: Diagnosis not present

## 2016-05-28 DIAGNOSIS — R0602 Shortness of breath: Secondary | ICD-10-CM | POA: Diagnosis not present

## 2016-05-28 DIAGNOSIS — Z886 Allergy status to analgesic agent status: Secondary | ICD-10-CM | POA: Diagnosis not present

## 2016-05-28 DIAGNOSIS — J9601 Acute respiratory failure with hypoxia: Secondary | ICD-10-CM | POA: Diagnosis present

## 2016-05-28 DIAGNOSIS — Z794 Long term (current) use of insulin: Secondary | ICD-10-CM

## 2016-05-28 DIAGNOSIS — Z888 Allergy status to other drugs, medicaments and biological substances status: Secondary | ICD-10-CM | POA: Diagnosis not present

## 2016-05-28 DIAGNOSIS — F17210 Nicotine dependence, cigarettes, uncomplicated: Secondary | ICD-10-CM | POA: Diagnosis present

## 2016-05-28 DIAGNOSIS — N2581 Secondary hyperparathyroidism of renal origin: Secondary | ICD-10-CM | POA: Diagnosis present

## 2016-05-28 DIAGNOSIS — L8990 Pressure ulcer of unspecified site, unspecified stage: Secondary | ICD-10-CM | POA: Insufficient documentation

## 2016-05-28 DIAGNOSIS — D631 Anemia in chronic kidney disease: Secondary | ICD-10-CM | POA: Diagnosis not present

## 2016-05-28 DIAGNOSIS — Z905 Acquired absence of kidney: Secondary | ICD-10-CM | POA: Diagnosis not present

## 2016-05-28 DIAGNOSIS — Z944 Liver transplant status: Secondary | ICD-10-CM | POA: Diagnosis not present

## 2016-05-28 DIAGNOSIS — Z952 Presence of prosthetic heart valve: Secondary | ICD-10-CM | POA: Diagnosis not present

## 2016-05-28 DIAGNOSIS — Z7901 Long term (current) use of anticoagulants: Secondary | ICD-10-CM | POA: Diagnosis not present

## 2016-05-28 DIAGNOSIS — N185 Chronic kidney disease, stage 5: Secondary | ICD-10-CM | POA: Diagnosis not present

## 2016-05-28 DIAGNOSIS — I2510 Atherosclerotic heart disease of native coronary artery without angina pectoris: Secondary | ICD-10-CM | POA: Diagnosis present

## 2016-05-28 DIAGNOSIS — K219 Gastro-esophageal reflux disease without esophagitis: Secondary | ICD-10-CM | POA: Diagnosis present

## 2016-05-28 DIAGNOSIS — Z951 Presence of aortocoronary bypass graft: Secondary | ICD-10-CM | POA: Diagnosis not present

## 2016-05-28 DIAGNOSIS — I132 Hypertensive heart and chronic kidney disease with heart failure and with stage 5 chronic kidney disease, or end stage renal disease: Secondary | ICD-10-CM | POA: Diagnosis present

## 2016-05-28 DIAGNOSIS — Z79899 Other long term (current) drug therapy: Secondary | ICD-10-CM | POA: Diagnosis not present

## 2016-05-28 DIAGNOSIS — E039 Hypothyroidism, unspecified: Secondary | ICD-10-CM | POA: Diagnosis present

## 2016-05-28 DIAGNOSIS — J449 Chronic obstructive pulmonary disease, unspecified: Secondary | ICD-10-CM | POA: Diagnosis present

## 2016-05-28 DIAGNOSIS — I5040 Unspecified combined systolic (congestive) and diastolic (congestive) heart failure: Secondary | ICD-10-CM | POA: Diagnosis not present

## 2016-05-28 DIAGNOSIS — Z7982 Long term (current) use of aspirin: Secondary | ICD-10-CM | POA: Diagnosis not present

## 2016-05-28 DIAGNOSIS — E8770 Fluid overload, unspecified: Secondary | ICD-10-CM | POA: Diagnosis present

## 2016-05-28 LAB — CBC
HCT: 31.4 % — ABNORMAL LOW (ref 36.0–46.0)
Hemoglobin: 10.4 g/dL — ABNORMAL LOW (ref 12.0–15.0)
MCH: 28.7 pg (ref 26.0–34.0)
MCHC: 33.1 g/dL (ref 30.0–36.0)
MCV: 86.7 fL (ref 78.0–100.0)
Platelets: 198 10*3/uL (ref 150–400)
RBC: 3.62 MIL/uL — AB (ref 3.87–5.11)
RDW: 17.8 % — AB (ref 11.5–15.5)
WBC: 13.5 10*3/uL — AB (ref 4.0–10.5)

## 2016-05-28 LAB — COMPREHENSIVE METABOLIC PANEL
ALBUMIN: 1.7 g/dL — AB (ref 3.5–5.0)
ALT: 13 U/L — AB (ref 14–54)
AST: 24 U/L (ref 15–41)
Alkaline Phosphatase: 143 U/L — ABNORMAL HIGH (ref 38–126)
Anion gap: 8 (ref 5–15)
BUN: 13 mg/dL (ref 6–20)
CHLORIDE: 99 mmol/L — AB (ref 101–111)
CO2: 28 mmol/L (ref 22–32)
CREATININE: 3.03 mg/dL — AB (ref 0.44–1.00)
Calcium: 8.8 mg/dL — ABNORMAL LOW (ref 8.9–10.3)
GFR calc Af Amer: 18 mL/min — ABNORMAL LOW (ref >60)
GFR, EST NON AFRICAN AMERICAN: 16 mL/min — AB (ref >60)
Glucose, Bld: 163 mg/dL — ABNORMAL HIGH (ref 65–99)
POTASSIUM: 3.9 mmol/L (ref 3.5–5.1)
SODIUM: 135 mmol/L (ref 135–145)
Total Bilirubin: 1.1 mg/dL (ref 0.3–1.2)
Total Protein: 6.1 g/dL — ABNORMAL LOW (ref 6.5–8.1)

## 2016-05-28 LAB — GLUCOSE, CAPILLARY
GLUCOSE-CAPILLARY: 156 mg/dL — AB (ref 65–99)
Glucose-Capillary: 140 mg/dL — ABNORMAL HIGH (ref 65–99)

## 2016-05-28 LAB — TROPONIN I: Troponin I: 0.13 ng/mL (ref <0.03)

## 2016-05-28 LAB — HEMOGLOBIN A1C
HEMOGLOBIN A1C: 5.6 % (ref 4.8–5.6)
Mean Plasma Glucose: 114 mg/dL

## 2016-05-28 LAB — PROTIME-INR
INR: 3.08
Prothrombin Time: 32.5 seconds — ABNORMAL HIGH (ref 11.4–15.2)

## 2016-05-28 MED ORDER — PRO-STAT SUGAR FREE PO LIQD
30.0000 mL | Freq: Every day | ORAL | Status: DC
  Administered 2016-05-28: 30 mL via ORAL
  Filled 2016-05-28: qty 30

## 2016-05-28 MED ORDER — WARFARIN SODIUM 2 MG PO TABS
2.0000 mg | ORAL_TABLET | Freq: Once | ORAL | Status: AC
  Administered 2016-05-28: 2 mg via ORAL
  Filled 2016-05-28: qty 1

## 2016-05-28 MED ORDER — DARBEPOETIN ALFA 40 MCG/0.4ML IJ SOSY
PREFILLED_SYRINGE | INTRAMUSCULAR | Status: AC
  Administered 2016-05-28: 40 ug via INTRAVENOUS
  Filled 2016-05-28: qty 0.4

## 2016-05-28 NOTE — Progress Notes (Signed)
Patient states that she lives at home with family and has help 24/7, has all necessary equipment and wants to go home refusing SNF

## 2016-05-28 NOTE — Progress Notes (Signed)
Patient discharged to home, IV and tele were discontinued, AVS was reviewed, patient confirmed they had all belongings and left floor via wheelchair escorted by family members

## 2016-05-28 NOTE — Progress Notes (Signed)
ANTICOAGULATION CONSULT NOTE - Follow Up Consult  Pharmacy Consult for Coumadin Indication: mechanical AVR  Allergies  Allergen Reactions  . Acetaminophen Other (See Comments)    Liver transplant recipient   . Morphine Itching and Other (See Comments)    hallucinate  . Codeine Itching  . Mirtazapine Other (See Comments)    hallucination    Patient Measurements: Height: 5\' 4"  (162.6 cm) Weight: 108 lb 3.9 oz (49.1 kg) IBW/kg (Calculated) : 54.7   Vital Signs: Temp: 98.1 F (36.7 C) (04/18 1430) Temp Source: Oral (04/18 1430) BP: 128/55 (04/18 1430) Pulse Rate: 82 (04/18 1430)  Labs:  Recent Labs  05/27/16 1400 05/27/16 1837 05/27/16 2001 05/28/16 0214  HGB 10.4*  --   --  10.4*  HCT 31.4*  --   --  31.4*  PLT 220  --   --  198  LABPROT  --  29.1*  --  32.5*  INR  --  2.68  --  3.08  CREATININE 4.59*  --   --  3.03*  TROPONINI 0.16*  --  0.14* 0.13*    Estimated Creatinine Clearance: 15.1 mL/min (A) (by C-G formula based on SCr of 3.03 mg/dL (H)).  Assessment:  59 YOF with mechanical AVR. Recent anticoagulant clinic note from 4/12 had an INR of 3.4 on dose of 2.5mg  daily.    INR 3.08. Therapeutic, but up from 2.68 yesterday.  Goal of Therapy:  INR 2.5-3.5 Monitor platelets by anticoagulation protocol: Yes   Plan:   Coumadin 2 mg today.  Daily PT/INR.  Discussed with patient.  Arty Baumgartner,  Pager: [phone] 05/28/2016,4:22 PM

## 2016-05-28 NOTE — Progress Notes (Signed)
PROGRESS NOTE    [name]  PZW:[id number] DOB: 1955-06-13 DOA: 05/27/2016 PCP: [name], MD   Outpatient Specialists:     Brief Narrative:  [name] is a 61 y.o. female who presents with acute respiratory failure likely secondary to fluid overload from CHF exacerbation in the setting of ESRD/dialysis.    Assessment & Plan:   Active Problems:   Hypothyroidism   Type 2 diabetes mellitus with chronic kidney disease (HCC)   Anemia due to chronic renal failure    Hypertensive heart disease   GERD   End stage renal disease on dialysis (HCC)   CAD (coronary artery disease), native coronary artery   History of prosthetic aortic valve replacement   Dyslipidemia-LDL 104, not on statin with Hx of liver transplant   Acute respiratory failure (HCC)   Acute on chronic systolic CHF (congestive heart failure) (HCC)   Chronic combined systolic and diastolic congestive heart failure (HCC)   COPD (chronic obstructive pulmonary disease) (HCC)   Tobacco abuse   Chronic anticoagulation w/ coumadin, goal INR 2.5-3.0 w/ AVR   Respiratory failure (HCC)   Chronic hepatitis C without hepatic coma (HCC)   H/O unilateral nephrectomy   History of migraine   ESRD on dialysis (HCC)   Acute exacerbation of CHF (congestive heart failure) (HCC)   Pressure injury of skin   Volume overload   Acute hypoxic respiratory failure with hypoxia  likely secondary to acute on chronic systolic and diastolic CHF exacerbation, less likely due to COPD .CXR  cardiomegaly, with vascular congestion, and small effusions.  Pro BNP 109,000 . Tn was 0.12 in the setting of oxygen demand Last 2 D echo    Osats now in the 90s on 2 L and patient in no acute respiratory distress   VSS  Weight 125 lbs    Last 2-D echo in February 2018 showing EF 40 to 42%, grade 3 diastolic dysfunction, -  Telemetry CHForder set O2   Cycle troponins/ EKG   Daily weights and strict I/O Continue CHF meds  Will defer to Nephrology  diuresis/fluid overload issues, as she may undergo dialysis   History of COPD  with admission for hypoxic respiratory failure unlikely due to exacerbation of same / History of asthma not on inhalers. Patient not on O2 at home   WBC pending. No acute distress  Afebrile   VSS   Currently on O2 at 2l as above  Duonebs  Neb prn   Mucinex O2 prn CBC in am Incentive spirometry CXR in am     ESRD on HD MWF, last on 4/16. Primary MD Dr. 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  D/c from hospital on 4/10 with phamtom limb pain  Continue pain meds, and Lyrica   PT/OT consult - CIR vs SNF  Known sacral wound wound care by nursing     DVT prophylaxis:  Fully anticoagulated  Code Status: Partial Code  Family Communication: patient  Disposition Plan:  TBD- PT to see in AM  Consultants:   renal    Subjective: In dialysis-- no complaints  Objective: Vitals:   05/28/16 1100 05/28/16 1130 05/28/16 1143 05/28/16 1430  BP: 109/67 (!) 142/69 120/64 (!) 128/55  Pulse: 81 91 87 82  Resp:   14 16  Temp:   97.9 F (36.6 C) 98.1 F (36.7 C)  TempSrc:   Axillary Oral  SpO2:   99% 97%  Weight:   49.1 kg (108 lb 3.9 oz)     Intake/Output Summary (Last 24 hours) at 05/28/16 1613 Last data filed at 05/28/16 1527  Gross per 24 hour  Intake              600 ml  Output             5763 ml  Net            -5163 ml   Filed Weights   05/28/16 0408 05/28/16 0746 05/28/16 1143  Weight: 51.4 kg (113 lb 5.1 oz) 51.6 kg (113 lb 12.1 oz)  49.1 kg (108 lb 3.9 oz)    Examination:  General exam: Appears calm and comfortable  Respiratory system: Clear to auscultation. Respiratory effort normal. Cardiovascular system: S1 & S2 heard, RRR. No JVD, murmurs, rubs, gallops or clicks. No pedal edema. Gastrointestinal system: Abdomen is nondistended, soft and nontender. No organomegaly or masses felt. Normal bowel sounds heard. Central nervous system: Alert and oriented Extremities: L BKA     Data Reviewed: I have personally reviewed following labs and imaging studies  CBC:  Recent Labs Lab 05/27/16 1400 05/28/16 0214  WBC 9.4 13.5*  HGB 10.4* 10.4*  HCT 31.4* 31.4*  MCV 88.0 86.7  PLT 220 220   Basic Metabolic Panel:  Recent Labs Lab 05/27/16 1400 05/28/16 0214  NA 136 135  K 3.9 3.9  CL 98* 99*  CO2 28 28  GLUCOSE 302* 163*  BUN 22* 13  CREATININE 4.59* 3.03*  CALCIUM 8.6* 8.8*   GFR: Estimated Creatinine Clearance: 15.1 mL/min (A) (by C-G formula based on SCr of 3.03 mg/dL (H)). Liver Function Tests:  Recent Labs Lab 05/27/16 1400 05/28/16 0214  AST 30 24  ALT 13* 13*  ALKPHOS 153* 143*  BILITOT 0.9 1.1  PROT 6.6 6.1*  ALBUMIN 1.7* 1.7*   No results for input(s): LIPASE, AMYLASE in the last 168 hours. No results for input(s): AMMONIA in the last 168 hours. Coagulation Profile:  Recent Labs Lab 05/22/16 05/27/16 1837 05/28/16 0214  INR 3.4 2.68 3.08   Cardiac Enzymes:  Recent Labs Lab 05/27/16 1400 05/27/16 2001 05/28/16 0214  TROPONINI 0.16* 0.14* 0.13*   BNP (last 3 results) No results for input(s): PROBNP in the last 8760 hours. HbA1C:  Recent Labs  05/27/16 1034  HGBA1C 5.6   CBG:  Recent Labs Lab 05/27/16 0955 05/27/16 1221 05/27/16 2049 05/28/16 1222  GLUCAP 156* 210* 180* 156*   Lipid Profile: No results for input(s): CHOL, HDL, LDLCALC, TRIG, CHOLHDL, LDLDIRECT in the last 72 hours. Thyroid Function Tests: No results for input(s): TSH, T4TOTAL, FREET4,  T3FREE, THYROIDAB in the last 72 hours. Anemia Panel: No results for input(s): VITAMINB12, FOLATE, FERRITIN, TIBC, IRON, RETICCTPCT in the last 72 hours. Urine analysis:    Component Value Date/Time   COLORURINE YELLOW 11/02/2012 0428   APPEARANCEUR CLOUDY (A) 11/02/2012 0428   LABSPEC 1.011 11/02/2012 0428   PHURINE 8.0 11/02/2012 0428   GLUCOSEU 250 (A) 11/02/2012 0428   HGBUR LARGE (A) 11/02/2012 0428   BILIRUBINUR NEGATIVE  11/02/2012 0428   KETONESUR NEGATIVE 11/02/2012 0428   PROTEINUR >300 (A) 11/02/2012 0428   UROBILINOGEN 0.2 11/02/2012 0428   NITRITE NEGATIVE 11/02/2012 0428   LEUKOCYTESUR NEGATIVE 11/02/2012 0428     ) Recent Results (from the past 240 hour(s))  MRSA PCR Screening     Status: None   Collection Time: 05/27/16  7:47 PM  Result Value Ref Range Status   MRSA by PCR NEGATIVE NEGATIVE Final    Comment:        The GeneXpert MRSA Assay (FDA approved for NASAL specimens only), is one component of a comprehensive MRSA colonization surveillance program. It is not intended to diagnose MRSA infection nor to guide or monitor treatment for MRSA infections.       Anti-infectives    None       Radiology Studies: No results found.      Scheduled Meds: . aspirin EC  81 mg Oral Daily  . bacitracin   Topical Daily  . calcitRIOL  0.5 mcg Oral Q M,W,F-HD  . darbepoetin (ARANESP) injection - DIALYSIS  40 mcg Intravenous Q Wed-HD  . feeding supplement  1 Container Oral TID BM  . feeding supplement (PRO-STAT SUGAR FREE 64)  30 mL Oral Q1500  . insulin aspart  0-9 Units Subcutaneous TID WC  . levothyroxine  175 mcg Oral QAC breakfast  . metoCLOPramide  5 mg Oral BID AC  . metoprolol tartrate  25 mg Oral BID  . multivitamin  1 tablet Oral QHS  . pantoprazole  40 mg Oral Daily  . polyethylene glycol  17 g Oral BID  . pregabalin  75 mg Oral Daily  . senna-docusate  1 tablet Oral Daily  . sodium chloride flush  3 mL Intravenous Q12H  . tacrolimus  2  mg Oral BID  . Warfarin - Pharmacist Dosing Inpatient   Does not apply q1800   Continuous Infusions: . sodium chloride       LOS: 0 days    Time spent: 25 min    [place], DO [facility] Pager [phone]  If 7PM-7AM, please contact night-coverage [email or web address] Password [facility] 05/28/2016, 4:13 PM

## 2016-05-28 NOTE — Discharge Summary (Signed)
Physician Discharge Summary  DEMAYA [name] OZD:[id number] DOB: 11/19/1955 DOA: 05/27/2016  PCP: [name], MD  Admit date: 05/27/2016 Discharge date: 05/28/2016   Recommendations for Outpatient Follow-Up:   1. Patient refused CIR/SNF- has 24 hour care at home and wishes to return there 2. Resume HD with lower dry weight due to weight loss   Discharge Diagnosis:   Active Problems:   Hypothyroidism   Type 2 diabetes mellitus with chronic kidney disease (HCC)   Anemia due to chronic renal failure    Hypertensive heart disease   GERD   End stage renal disease on dialysis (HCC)   CAD (coronary artery disease), native coronary artery   History of prosthetic aortic valve replacement   Dyslipidemia-LDL 104, not on statin with Hx of liver transplant   Acute respiratory failure (HCC)   Acute on chronic systolic CHF (congestive heart failure) (HCC)   Chronic combined systolic and diastolic congestive heart failure (HCC)   COPD (chronic obstructive pulmonary disease) (HCC)   Tobacco abuse   Chronic anticoagulation w/ coumadin, goal INR 2.5-3.0 w/ AVR   Respiratory failure (HCC)   Chronic hepatitis C without hepatic coma (HCC)   H/O unilateral nephrectomy   History of migraine   ESRD on dialysis ([place])   Acute exacerbation of CHF (congestive heart failure) (HCC)   Pressure injury of skin   Volume overload   Discharge disposition:  Home.   Discharge Condition: Improved.  Diet recommendation: renal/carb mod diet  Wound care: None.   History of Present Illness:   [name] is a 61 y.o. female who presents with acute respiratory failure likely secondary to fluid overload from CHF exacerbation in the setting of ESRD/dialysis. Nephrology following and appreciate their assistance as this is likely patient's only avenue for improvement in her acute condition.   Hospital Course by Problem:   Acute hypoxic respiratory failure with hypoxia likely secondary to acute on  chronic systolic and diastolic CHFexacerbation, less likely due to COPD .CXR cardiomegaly, with vascular congestion, and small effusions. Pro BNP 109,000 . Tn was 0.12 in the setting of oxygen demand  -dialysis x2 and weaned off O2  History of COPD with admission for hypoxic respiratory failure unlikely due to exacerbation of same / History of asthma not on inhalers.  -stable  ESRD on HD MWF, last on 4/16. Primary MD Dr. 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 D/c from hospital on 4/10 with phamtom limb pain  Continue pain meds, and Lyrica  Patient to return home with 24 hour care by family  Known sacral wound continue wound care  Intially was thought that patinet would need another dialysis in the hospital but patient suprisingly improved after 2 so was d/c'd home   Medical Consultants:    renal   Discharge Exam:   Vitals:   05/28/16 1143 05/28/16 1430  BP: 120/64 (!) 128/55  Pulse: 87 82  Resp: 14 16  Temp: 97.9 F (36.6 C) 98.1 F (36.7 C)   Vitals:   05/28/16 1130  05/28/16 1143 05/28/16 1430 05/28/16 1600  BP: (!) 142/69 120/64 (!) 128/55   Pulse: 91 87 82   Resp:  14 16   Temp:  97.9 F (36.6 C) 98.1 F (36.7 C)   TempSrc:  Axillary Oral   SpO2:  99% 97%   Weight:  49.1 kg (108 lb 3.9 oz)    Height:    5\' 4"  (1.626 m)      The results of significant diagnostics from this hospitalization (including imaging, microbiology, ancillary and laboratory) are listed below for  reference.     Procedures and Diagnostic Studies:   Dg Chest 2 View  Result Date: 05/28/2016 CLINICAL DATA:  Shortness of breath, CHF, history of coronary artery disease, dialysis dependent renal failure, liver transplant. EXAM: CHEST  2 VIEW COMPARISON:  Portable chest x-ray of May 27, 2016 FINDINGS: The lungs are well-expanded. Bilateral pleural effusions persist. Bibasilar densities persist. The cardiac silhouette is top-normal in size. The central pulmonary vascularity is prominent. There is calcification in the wall of the aortic arch. The patient has undergone previous aortic valve replacement. The dual-lumen dialysis catheter tip projects at the cavoatrial junction. The observed bony thorax exhibits no acute abnormality. IMPRESSION: CHF with small to moderate-sized bilateral pleural effusions, stable. Bibasilar densities may reflect atelectasis or pneumonia Electronically Signed   By: David  Martinique M.D.   On: 05/28/2016 08:04     Labs:   Basic Metabolic Panel:  Recent Labs Lab 05/27/16 1400 05/28/16 0214  NA 136 135  K 3.9 3.9  CL 98* 99*  CO2 28 28  GLUCOSE 302* 163*  BUN 22* 13  CREATININE 4.59* 3.03*  CALCIUM 8.6* 8.8*   GFR Estimated Creatinine Clearance: 15.1 mL/min (A) (by C-G formula based on SCr of 3.03 mg/dL (H)). Liver Function Tests:  Recent Labs Lab 05/27/16 1400 05/28/16 0214  AST 30 24  ALT 13* 13*  ALKPHOS 153* 143*  BILITOT 0.9 1.1  PROT 6.6 6.1*  ALBUMIN 1.7* 1.7*   No results for input(s): LIPASE, AMYLASE in the last 168 hours. No results for input(s): AMMONIA in the last 168 hours. Coagulation profile  Recent Labs Lab 05/22/16 05/27/16 1837 05/28/16 0214  INR 3.4 2.68 3.08    CBC:  Recent Labs Lab 05/27/16 1400 05/28/16 0214  WBC 9.4 13.5*  HGB 10.4* 10.4*  HCT 31.4* 31.4*  MCV 88.0 86.7  PLT 220 198   Cardiac Enzymes:  Recent Labs Lab 05/27/16 1400 05/27/16 2001 05/28/16 0214  TROPONINI 0.16* 0.14* 0.13*    BNP: Invalid input(s): POCBNP CBG:  Recent Labs Lab 05/27/16 0955 05/27/16 1221 05/27/16 2049 05/28/16 1222 05/28/16 1645  GLUCAP 156* 210* 180* 156* 140*   D-Dimer No results for input(s): DDIMER in the last 72 hours. Hgb A1c  Recent Labs  05/27/16 1034  HGBA1C 5.6   Lipid Profile No results for input(s): CHOL, HDL, LDLCALC, TRIG, CHOLHDL, LDLDIRECT in the last 72 hours. Thyroid function studies No results for input(s): TSH, T4TOTAL, T3FREE, THYROIDAB in the last 72 hours.  Invalid input(s): FREET3 Anemia work up No results for input(s): VITAMINB12, FOLATE, FERRITIN, TIBC, IRON, RETICCTPCT in the last 72 hours. Microbiology Recent Results (from the past 240 hour(s))  MRSA PCR Screening     Status: None   Collection Time: 05/27/16  7:47 PM  Result Value Ref Range Status   MRSA by PCR NEGATIVE NEGATIVE Final    Comment:        The GeneXpert MRSA Assay (FDA approved for NASAL  specimens only), is one component of a comprehensive MRSA colonization surveillance program. It is not intended to diagnose MRSA infection nor to guide or monitor treatment for MRSA infections.      Discharge Instructions:   Discharge Instructions    Discharge instructions    Complete by:  As directed    Continue 24 hour care at home Renal/carb mod   Increase activity slowly    Complete by:  As directed      Allergies as of 05/28/2016      Reactions   Acetaminophen Other (See Comments)   Liver transplant recipient    Morphine Itching, Other (See Comments)   hallucinate   Codeine Itching   Mirtazapine Other (See Comments)   hallucination      Medication List    STOP taking these medications   collagenase ointment Commonly known as:  SANTYL   guaiFENesin-dextromethorphan 100-10 MG/5ML syrup Commonly known as:  ROBITUSSIN DM     TAKE these medications   allopurinol 100 MG tablet Commonly known as:  ZYLOPRIM Take 100 mg by mouth daily.   aspirin EC 81 MG  tablet Take 81 mg by mouth daily.   calcitRIOL 0.5 MCG capsule Commonly known as:  ROCALTROL Take 1 capsule (0.5 mcg total) by mouth every Monday, Wednesday, and Friday with hemodialysis.   feeding supplement Liqd Take 1 Container by mouth 3 (three) times daily between meals.   HYDROmorphone 2 MG tablet Commonly known as:  DILAUDID Take 1 tablet (2 mg total) by mouth at bedtime as needed for severe pain.   levothyroxine 175 MCG tablet Commonly known as:  SYNTHROID, LEVOTHROID TAKE 1 TABLET (175 MCG TOTAL) BY MOUTH DAILY BEFORE BREAKFAST.   methocarbamol 500 MG tablet Commonly known as:  ROBAXIN Take 0.5 tablets (250 mg total) by mouth every 6 (six) hours as needed for muscle spasms.   metoCLOPramide 5 MG tablet Commonly known as:  REGLAN Take 1 tablet (5 mg total) by mouth 2 (two) times daily before a meal. Decrease to once a day before breakfast after a couple of weeks.   metoprolol tartrate 25 MG tablet Commonly known as:  LOPRESSOR Take 1 tablet (25 mg total) by mouth 2 (two) times daily.   multivitamin Tabs tablet Take 1 tablet by mouth at bedtime.   nitroGLYCERIN 0.4 MG SL tablet Commonly known as:  NITROSTAT Place 1 tablet (0.4 mg total) under the tongue every 5 (five) minutes as needed for chest pain.   ondansetron 4 MG disintegrating tablet Commonly known as:  ZOFRAN ODT Take 1 tablet (4 mg total) by mouth every 8 (eight) hours as needed for nausea.   oxyCODONE 5 MG immediate release tablet Commonly known as:  Oxy IR/ROXICODONE Take 1 tablet (5 mg total) by mouth 2 (two) times daily as needed for severe pain or breakthrough pain.   pantoprazole 40 MG tablet Commonly known as:  PROTONIX Take 1 tablet (40 mg total) by mouth daily.   polyethylene glycol packet Commonly known as:  MIRALAX / GLYCOLAX Take 17 g by mouth 2 (two) times daily.   pregabalin 75 MG capsule Commonly known as:  LYRICA Take 1 capsule (75 mg total) by mouth daily.   senna-docusate  8.6-50 MG tablet Commonly known as:  Senokot-S Take 1 tablet by mouth daily.   tacrolimus 1 MG capsule Commonly known as:  PROGRAF Take 2 capsules (2 mg total) by mouth 2 (two) times daily. Take 2 capsules twice daily. What changed:  how much to take  when to take this  additional instructions   traMADol 50 MG tablet Commonly known as:  ULTRAM Take 1 tablet (50 mg total) by mouth every 6 (six) hours as needed for moderate pain.   warfarin 2.5 MG tablet Commonly known as:  COUMADIN Take 1 tablet (2.5 mg total) by mouth daily at 6 PM.      Follow-up Information    HOLT,LYNLEY S, MD Follow up.   Specialty:  Family Medicine Contact information: [address] [place] [phone]        resume HD as prior Follow up.            Time coordinating discharge: 35 min  Signed:  Shariq Puig U Declin Rajan   [facility] 05/28/2016, 6:04 PM

## 2016-05-28 NOTE — Discharge Instructions (Signed)
Information on my medicine - Coumadin   (Warfarin)  This medication education was reviewed with me or my healthcare representative as part of my discharge preparation.  The pharmacist that spoke with me during my hospital stay was:  Arty Baumgartner, [facility]  Why was Coumadin prescribed for you? Coumadin was prescribed for you because you have a blood clot or a medical condition that can cause an increased risk of forming blood clots. Blood clots can cause serious health problems by blocking the flow of blood to the heart, lung, or brain. Coumadin can prevent harmful blood clots from forming. As a reminder your indication for Coumadin is:   Blood Clot Prevention After Heart Pump Surgery  What test will check on my response to Coumadin? While on Coumadin (warfarin) you will need to have an INR test regularly to ensure that your dose is keeping you in the desired range. The INR (international normalized ratio) number is calculated from the result of the laboratory test called prothrombin time (PT).  If an INR APPOINTMENT HAS NOT ALREADY BEEN MADE FOR YOU please schedule an appointment to have this lab work done by your health care provider within 7 days. Your INR goal is usually a number between:  2.5 to 3.5 or your provider may give you a more narrow range like 2.5-3.0.  Ask your health care provider during an office visit what your goal INR is.  What  do you need to  know  About  COUMADIN? Take Coumadin (warfarin) exactly as prescribed by your healthcare provider about the same time each day.  DO NOT stop taking without talking to the doctor who prescribed the medication.  Stopping without other blood clot prevention medication to take the place of Coumadin may increase your risk of developing a new clot or stroke.  Get refills before you run out.  What do you do if you miss a dose? If you miss a dose, take it as soon as you remember on the same day then continue your regularly scheduled regimen  the next day.  Do not take two doses of Coumadin at the same time.  Important Safety Information A possible side effect of Coumadin (Warfarin) is an increased risk of bleeding. You should call your healthcare provider right away if you experience any of the following: ? Bleeding from an injury or your nose that does not stop. ? Unusual colored urine (red or dark brown) or unusual colored stools (red or black). ? Unusual bruising for unknown reasons. ? A serious fall or if you hit your head (even if there is no bleeding).  Some foods or medicines interact with Coumadin (warfarin) and might alter your response to warfarin. To help avoid this: ? Eat a balanced diet, maintaining a consistent amount of Vitamin K. ? Notify your provider about major diet changes you plan to make. ? Avoid alcohol or limit your intake to 1 drink for women and 2 drinks for men per day. (1 drink is 5 oz. wine, 12 oz. beer, or 1.5 oz. liquor.)  Make sure that ANY health care provider who prescribes medication for you knows that you are taking Coumadin (warfarin).  Also make sure the healthcare provider who is monitoring your Coumadin knows when you have started a new medication including herbals and non-prescription products.  Coumadin (Warfarin)  Major Drug Interactions  Increased Warfarin Effect Decreased Warfarin Effect  Alcohol (large quantities) Antibiotics (esp. Septra/Bactrim, Flagyl, Cipro) Amiodarone (Cordarone) Aspirin (ASA) Cimetidine (Tagamet) Megestrol (Megace) NSAIDs (  ibuprofen, naproxen, etc.) °Piroxicam (Feldene) °Propafenone (Rythmol SR) °Propranolol (Inderal) °Isoniazid (INH) °Posaconazole (Noxafil) Barbiturates (Phenobarbital) °Carbamazepine (Tegretol) °Chlordiazepoxide (Librium) °Cholestyramine (Questran) °Griseofulvin °Oral Contraceptives °Rifampin °Sucralfate (Carafate) °Vitamin K  ° °Coumadin® (Warfarin) Major Herbal Interactions  °Increased Warfarin Effect Decreased Warfarin Effect   °Garlic °Ginseng °Ginkgo biloba Coenzyme Q10 °Green tea °St. John’s wort   ° °Coumadin® (Warfarin) FOOD Interactions  °Eat a consistent number of servings per week of foods HIGH in Vitamin K °(1 serving = ½ cup)  °Collards (cooked, or boiled & drained) °Kale (cooked, or boiled & drained) °Mustard greens (cooked, or boiled & drained) °Parsley *serving size only = ¼ cup °Spinach (cooked, or boiled & drained) °Swiss chard (cooked, or boiled & drained) °Turnip greens (cooked, or boiled & drained)  °Eat a consistent number of servings per week of foods MEDIUM-HIGH in Vitamin K °(1 serving = 1 cup)  °Asparagus (cooked, or boiled & drained) °Broccoli (cooked, boiled & drained, or raw & chopped) °Brussel sprouts (cooked, or boiled & drained) *serving size only = ½ cup °Lettuce, raw (green leaf, endive, romaine) °Spinach, raw °Turnip greens, raw & chopped  ° °These websites have more information on Coumadin (warfarin):  www.coumadin.com; °www.ahrq.gov/consumer/coumadin.htm; ° ° °

## 2016-05-28 NOTE — Care Management Note (Signed)
Case Management Note  Patient Details  Name: [name] MRN: [id number] Date of Birth: [birth year]  Subjective/Objective:                 Patient admitted 3/12 to 3/21. Had LBKA 3/15. DC'd to CIR. Stayed in CIR 3/21 to 4/10. Returned to hospital in obs status 4/17 for acute respiratory failure likely secondary to fluid overload from CHF exacerbation in the setting of ESRD/dialysis. PT note rec CIR. CIR deferred at this time, patient in obs and awaiting further progress. CM referral placed for CSW to assess for SNF as backup plan for discharge. Patient has 3 midnight stay as inpatient within last 30 days as required by Medicare. Discussed in progression.    Action/Plan:  Anticipate DC to SNF when medically stable. CM will continue to follow.  Expected Discharge Date:                  Expected Discharge Plan:  Skilled Nursing Facility  In-House Referral:  Clinical Social Work  Discharge planning Services  CM Consult  Post Acute Care Choice:    Choice offered to:     DME Arranged:    DME Agency:     HH Arranged:    [place] Agency:     Status of Service:  In process, will continue to follow  If discussed at Long Length of Stay Meetings, dates discussed:    Additional Comments:  Carles Collet, RN 05/28/2016, 9:51 AM

## 2016-05-28 NOTE — Procedures (Signed)
Feeling much better, "I slept great".  Weights down to 51kg, still prob has 5-7kg on.  Lungs clear today.  Plan UF 4 kg with HD today and she can be dc'd after HD home.  Will lower dry wt to 45-46 kg at her OP HD unit.     I was present at this dialysis session, have reviewed the session itself and made  appropriate changes [name] MD [place] pager [phone]   05/28/2016, 12:10 PM

## 2016-05-28 NOTE — Progress Notes (Signed)
Discussed with Dr. Eliseo Squires. We await therapy follow up to determine if pt can d/c home when medically ready or needs to be reevaluated for a possible readmission to inpt rehab or SNF. I will follow. [phone]

## 2016-05-28 NOTE — Progress Notes (Signed)
OT Cancellation    05/28/16 1000  OT Visit Information  Last OT Received On 05/28/16  Reason Eval/Treat Not Completed Patient at procedure or test/ unavailable (HD. Will check back as schedule allows.)   [facility], OTR/L [phone]

## 2016-05-28 NOTE — Progress Notes (Signed)
Patient is not interested in CIR/SNF_ has 24 hour care and wants to return home.

## 2016-05-28 NOTE — Progress Notes (Signed)
Initial Nutrition Assessment  DOCUMENTATION CODES:   Not applicable  INTERVENTION:  Continue Boost Breeze po TID, each supplement provides 250 kcal and 9 grams of protein.  Provide 30 ml Prostat po once daily, each supplement provides 100 kcal and 15 grams of protein.   Encourage adequate PO intake.  NUTRITION DIAGNOSIS:   Increased nutrient needs related to chronic illness as evidenced by estimated needs.  GOAL:   Patient will meet greater than or equal to 90% of their needs  MONITOR:   PO intake, Supplement acceptance, Labs, Weight trends, Skin, I & O's  REASON FOR ASSESSMENT:   Consult Poor PO  ASSESSMENT:   61 y.o. female with extensive medical history including HTN, ESRD on HD MWF (Dr. 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 She was transferred to [facility] to a telemetry bed for the management of her symptoms Presents with acute respiratory failure likely secondary to fluid overload from CHF exacerbation in the setting of ESRD/dialysis.   Pt was unavailable during time of visit. RD familiar to this patient from recent CIR admission. Meal completion was 75% at lunch today. Per MD note, appetite is normal. Pt with weight loss since admission which is related to fluid status. Pt currently has Boost Breeze ordered. RD to additionally order Prostat to aid in adequate protein intake.   Unable to complete Nutrition-Focused physical exam at this time.   Labs and medications reviewed.   Diet Order:  Diet renal/carb modified with fluid restriction Diet-HS  Snack? Nothing; Room service appropriate? Yes; Fluid consistency: Thin  Skin:  Wound (see comment) (Stage 2 to coccyx, stage 1 to hips, incision L leg)  Last BM:  4/16  Height:   Ht Readings from Last 1 Encounters:  04/30/16 5\' 4"  (1.626 m)    Weight:   Wt Readings from Last 1 Encounters:  05/28/16 108 lb 3.9 oz (49.1 kg)    Ideal Body Weight:  51 kg (adjusted for L BKA)  BMI:  Body mass index is 18.58 kg/m.  Estimated Nutritional Needs:   Kcal:  1700-1900  Protein:  75-90 grams  Fluid:  Per MD  EDUCATION NEEDS:   No education needs identified at this time  Corrin Parker, MS, RD, LDN Pager # [phone] After hours/ weekend pager # [phone]

## 2016-05-28 NOTE — Progress Notes (Signed)
PT Cancellation Note  Patient Details Name: [name] MRN: [id number] DOB: 1955-09-21   Cancelled Treatment:    Reason Eval/Treat Not Completed: Patient at procedure or test/unavailable   Currently in HD;  Will follow up later today as time allows;  Otherwise, will follow up for PT tomorrow;   Thank you,  Roney Marion, PT  Acute Rehabilitation Services Pager [phone] Office [phone]     Colletta [place] 05/28/2016, 10:47 AM

## 2016-05-29 ENCOUNTER — Encounter: Payer: Medicare Other | Admitting: Physical Medicine & Rehabilitation

## 2016-05-30 DIAGNOSIS — N2581 Secondary hyperparathyroidism of renal origin: Secondary | ICD-10-CM | POA: Diagnosis not present

## 2016-05-30 DIAGNOSIS — E118 Type 2 diabetes mellitus with unspecified complications: Secondary | ICD-10-CM | POA: Diagnosis not present

## 2016-05-30 DIAGNOSIS — N186 End stage renal disease: Secondary | ICD-10-CM | POA: Diagnosis not present

## 2016-05-30 DIAGNOSIS — E876 Hypokalemia: Secondary | ICD-10-CM | POA: Diagnosis not present

## 2016-05-30 DIAGNOSIS — Z23 Encounter for immunization: Secondary | ICD-10-CM | POA: Diagnosis not present

## 2016-06-02 ENCOUNTER — Telehealth: Payer: Self-pay

## 2016-06-02 DIAGNOSIS — Z23 Encounter for immunization: Secondary | ICD-10-CM | POA: Diagnosis not present

## 2016-06-02 DIAGNOSIS — N2581 Secondary hyperparathyroidism of renal origin: Secondary | ICD-10-CM | POA: Diagnosis not present

## 2016-06-02 DIAGNOSIS — E118 Type 2 diabetes mellitus with unspecified complications: Secondary | ICD-10-CM | POA: Diagnosis not present

## 2016-06-02 DIAGNOSIS — E876 Hypokalemia: Secondary | ICD-10-CM | POA: Diagnosis not present

## 2016-06-02 DIAGNOSIS — N186 End stage renal disease: Secondary | ICD-10-CM | POA: Diagnosis not present

## 2016-06-02 NOTE — Telephone Encounter (Signed)
[name]  from [facility] would like verbal orders to resume home health. [name] has been D/c from hospital. Verbal orders has been given. [name] can be reached at [phone] if there are any question or concerns.

## 2016-06-03 DIAGNOSIS — Z4781 Encounter for orthopedic aftercare following surgical amputation: Secondary | ICD-10-CM | POA: Diagnosis not present

## 2016-06-03 DIAGNOSIS — I132 Hypertensive heart and chronic kidney disease with heart failure and with stage 5 chronic kidney disease, or end stage renal disease: Secondary | ICD-10-CM | POA: Diagnosis not present

## 2016-06-03 DIAGNOSIS — I5042 Chronic combined systolic (congestive) and diastolic (congestive) heart failure: Secondary | ICD-10-CM | POA: Diagnosis not present

## 2016-06-03 DIAGNOSIS — E1151 Type 2 diabetes mellitus with diabetic peripheral angiopathy without gangrene: Secondary | ICD-10-CM | POA: Diagnosis not present

## 2016-06-03 DIAGNOSIS — E1122 Type 2 diabetes mellitus with diabetic chronic kidney disease: Secondary | ICD-10-CM | POA: Diagnosis not present

## 2016-06-03 DIAGNOSIS — N186 End stage renal disease: Secondary | ICD-10-CM | POA: Diagnosis not present

## 2016-06-04 DIAGNOSIS — E039 Hypothyroidism, unspecified: Secondary | ICD-10-CM | POA: Diagnosis not present

## 2016-06-04 DIAGNOSIS — Z23 Encounter for immunization: Secondary | ICD-10-CM | POA: Diagnosis not present

## 2016-06-04 DIAGNOSIS — E876 Hypokalemia: Secondary | ICD-10-CM | POA: Diagnosis not present

## 2016-06-04 DIAGNOSIS — E118 Type 2 diabetes mellitus with unspecified complications: Secondary | ICD-10-CM | POA: Diagnosis not present

## 2016-06-04 DIAGNOSIS — N186 End stage renal disease: Secondary | ICD-10-CM | POA: Diagnosis not present

## 2016-06-04 DIAGNOSIS — N2581 Secondary hyperparathyroidism of renal origin: Secondary | ICD-10-CM | POA: Diagnosis not present

## 2016-06-05 ENCOUNTER — Ambulatory Visit (INDEPENDENT_AMBULATORY_CARE_PROVIDER_SITE_OTHER): Payer: Medicare Other | Admitting: Pharmacist Clinician (PhC)/ Clinical Pharmacy Specialist

## 2016-06-05 DIAGNOSIS — E11622 Type 2 diabetes mellitus with other skin ulcer: Secondary | ICD-10-CM | POA: Diagnosis not present

## 2016-06-05 DIAGNOSIS — E1151 Type 2 diabetes mellitus with diabetic peripheral angiopathy without gangrene: Secondary | ICD-10-CM | POA: Diagnosis not present

## 2016-06-05 DIAGNOSIS — N186 End stage renal disease: Secondary | ICD-10-CM | POA: Diagnosis not present

## 2016-06-05 DIAGNOSIS — E1122 Type 2 diabetes mellitus with diabetic chronic kidney disease: Secondary | ICD-10-CM | POA: Diagnosis not present

## 2016-06-05 DIAGNOSIS — Z4781 Encounter for orthopedic aftercare following surgical amputation: Secondary | ICD-10-CM | POA: Diagnosis not present

## 2016-06-05 DIAGNOSIS — I96 Gangrene, not elsewhere classified: Secondary | ICD-10-CM | POA: Diagnosis not present

## 2016-06-05 DIAGNOSIS — I10 Essential (primary) hypertension: Secondary | ICD-10-CM | POA: Diagnosis not present

## 2016-06-05 DIAGNOSIS — I739 Peripheral vascular disease, unspecified: Secondary | ICD-10-CM | POA: Diagnosis not present

## 2016-06-05 DIAGNOSIS — I132 Hypertensive heart and chronic kidney disease with heart failure and with stage 5 chronic kidney disease, or end stage renal disease: Secondary | ICD-10-CM | POA: Diagnosis not present

## 2016-06-05 DIAGNOSIS — Z7901 Long term (current) use of anticoagulants: Secondary | ICD-10-CM

## 2016-06-05 DIAGNOSIS — I5042 Chronic combined systolic (congestive) and diastolic (congestive) heart failure: Secondary | ICD-10-CM | POA: Diagnosis not present

## 2016-06-05 LAB — POCT INR: INR: 4.5

## 2016-06-06 ENCOUNTER — Encounter: Payer: Medicare Other | Attending: Physical Medicine & Rehabilitation | Admitting: Physical Medicine & Rehabilitation

## 2016-06-06 DIAGNOSIS — N2581 Secondary hyperparathyroidism of renal origin: Secondary | ICD-10-CM | POA: Diagnosis not present

## 2016-06-06 DIAGNOSIS — E876 Hypokalemia: Secondary | ICD-10-CM | POA: Diagnosis not present

## 2016-06-06 DIAGNOSIS — N186 End stage renal disease: Secondary | ICD-10-CM | POA: Diagnosis not present

## 2016-06-06 DIAGNOSIS — E118 Type 2 diabetes mellitus with unspecified complications: Secondary | ICD-10-CM | POA: Diagnosis not present

## 2016-06-06 DIAGNOSIS — Z23 Encounter for immunization: Secondary | ICD-10-CM | POA: Diagnosis not present

## 2016-06-09 DIAGNOSIS — Z992 Dependence on renal dialysis: Secondary | ICD-10-CM | POA: Diagnosis not present

## 2016-06-09 DIAGNOSIS — E876 Hypokalemia: Secondary | ICD-10-CM | POA: Diagnosis not present

## 2016-06-09 DIAGNOSIS — N2581 Secondary hyperparathyroidism of renal origin: Secondary | ICD-10-CM | POA: Diagnosis not present

## 2016-06-09 DIAGNOSIS — Z23 Encounter for immunization: Secondary | ICD-10-CM | POA: Diagnosis not present

## 2016-06-09 DIAGNOSIS — E118 Type 2 diabetes mellitus with unspecified complications: Secondary | ICD-10-CM | POA: Diagnosis not present

## 2016-06-09 DIAGNOSIS — N186 End stage renal disease: Secondary | ICD-10-CM | POA: Diagnosis not present

## 2016-06-09 DIAGNOSIS — T8640 Unspecified complication of liver transplant: Secondary | ICD-10-CM | POA: Diagnosis not present

## 2016-06-10 ENCOUNTER — Ambulatory Visit (INDEPENDENT_AMBULATORY_CARE_PROVIDER_SITE_OTHER): Payer: Medicare Other | Admitting: Pharmacist Clinician (PhC)/ Clinical Pharmacy Specialist

## 2016-06-10 DIAGNOSIS — E1122 Type 2 diabetes mellitus with diabetic chronic kidney disease: Secondary | ICD-10-CM | POA: Diagnosis not present

## 2016-06-10 DIAGNOSIS — Z4781 Encounter for orthopedic aftercare following surgical amputation: Secondary | ICD-10-CM | POA: Diagnosis not present

## 2016-06-10 DIAGNOSIS — N186 End stage renal disease: Secondary | ICD-10-CM | POA: Diagnosis not present

## 2016-06-10 DIAGNOSIS — Z7901 Long term (current) use of anticoagulants: Secondary | ICD-10-CM

## 2016-06-10 DIAGNOSIS — I132 Hypertensive heart and chronic kidney disease with heart failure and with stage 5 chronic kidney disease, or end stage renal disease: Secondary | ICD-10-CM | POA: Diagnosis not present

## 2016-06-10 DIAGNOSIS — I5042 Chronic combined systolic (congestive) and diastolic (congestive) heart failure: Secondary | ICD-10-CM | POA: Diagnosis not present

## 2016-06-10 DIAGNOSIS — E1151 Type 2 diabetes mellitus with diabetic peripheral angiopathy without gangrene: Secondary | ICD-10-CM | POA: Diagnosis not present

## 2016-06-10 LAB — POCT INR: INR: 2.3

## 2016-06-11 DIAGNOSIS — E1122 Type 2 diabetes mellitus with diabetic chronic kidney disease: Secondary | ICD-10-CM | POA: Diagnosis not present

## 2016-06-11 DIAGNOSIS — I5042 Chronic combined systolic (congestive) and diastolic (congestive) heart failure: Secondary | ICD-10-CM | POA: Diagnosis not present

## 2016-06-11 DIAGNOSIS — E11621 Type 2 diabetes mellitus with foot ulcer: Secondary | ICD-10-CM | POA: Diagnosis not present

## 2016-06-11 DIAGNOSIS — L97519 Non-pressure chronic ulcer of other part of right foot with unspecified severity: Secondary | ICD-10-CM | POA: Diagnosis not present

## 2016-06-11 DIAGNOSIS — E876 Hypokalemia: Secondary | ICD-10-CM | POA: Diagnosis not present

## 2016-06-11 DIAGNOSIS — Z4781 Encounter for orthopedic aftercare following surgical amputation: Secondary | ICD-10-CM | POA: Diagnosis not present

## 2016-06-11 DIAGNOSIS — N186 End stage renal disease: Secondary | ICD-10-CM | POA: Diagnosis not present

## 2016-06-11 DIAGNOSIS — N2581 Secondary hyperparathyroidism of renal origin: Secondary | ICD-10-CM | POA: Diagnosis not present

## 2016-06-11 DIAGNOSIS — E1151 Type 2 diabetes mellitus with diabetic peripheral angiopathy without gangrene: Secondary | ICD-10-CM | POA: Diagnosis not present

## 2016-06-11 DIAGNOSIS — D631 Anemia in chronic kidney disease: Secondary | ICD-10-CM | POA: Diagnosis not present

## 2016-06-11 DIAGNOSIS — I132 Hypertensive heart and chronic kidney disease with heart failure and with stage 5 chronic kidney disease, or end stage renal disease: Secondary | ICD-10-CM | POA: Diagnosis not present

## 2016-06-11 DIAGNOSIS — L97909 Non-pressure chronic ulcer of unspecified part of unspecified lower leg with unspecified severity: Secondary | ICD-10-CM | POA: Diagnosis not present

## 2016-06-13 DIAGNOSIS — N186 End stage renal disease: Secondary | ICD-10-CM | POA: Diagnosis not present

## 2016-06-13 DIAGNOSIS — D631 Anemia in chronic kidney disease: Secondary | ICD-10-CM | POA: Diagnosis not present

## 2016-06-13 DIAGNOSIS — N2581 Secondary hyperparathyroidism of renal origin: Secondary | ICD-10-CM | POA: Diagnosis not present

## 2016-06-13 DIAGNOSIS — E876 Hypokalemia: Secondary | ICD-10-CM | POA: Diagnosis not present

## 2016-06-16 DIAGNOSIS — D631 Anemia in chronic kidney disease: Secondary | ICD-10-CM | POA: Diagnosis not present

## 2016-06-16 DIAGNOSIS — N2581 Secondary hyperparathyroidism of renal origin: Secondary | ICD-10-CM | POA: Diagnosis not present

## 2016-06-16 DIAGNOSIS — E876 Hypokalemia: Secondary | ICD-10-CM | POA: Diagnosis not present

## 2016-06-16 DIAGNOSIS — N186 End stage renal disease: Secondary | ICD-10-CM | POA: Diagnosis not present

## 2016-06-17 DIAGNOSIS — G629 Polyneuropathy, unspecified: Secondary | ICD-10-CM | POA: Diagnosis not present

## 2016-06-17 DIAGNOSIS — I739 Peripheral vascular disease, unspecified: Secondary | ICD-10-CM | POA: Diagnosis not present

## 2016-06-17 DIAGNOSIS — Z794 Long term (current) use of insulin: Secondary | ICD-10-CM | POA: Diagnosis not present

## 2016-06-17 DIAGNOSIS — I132 Hypertensive heart and chronic kidney disease with heart failure and with stage 5 chronic kidney disease, or end stage renal disease: Secondary | ICD-10-CM | POA: Diagnosis not present

## 2016-06-17 DIAGNOSIS — Z8619 Personal history of other infectious and parasitic diseases: Secondary | ICD-10-CM | POA: Diagnosis not present

## 2016-06-17 DIAGNOSIS — Z992 Dependence on renal dialysis: Secondary | ICD-10-CM | POA: Diagnosis not present

## 2016-06-17 DIAGNOSIS — L97412 Non-pressure chronic ulcer of right heel and midfoot with fat layer exposed: Secondary | ICD-10-CM | POA: Diagnosis not present

## 2016-06-17 DIAGNOSIS — L97411 Non-pressure chronic ulcer of right heel and midfoot limited to breakdown of skin: Secondary | ICD-10-CM | POA: Diagnosis not present

## 2016-06-17 DIAGNOSIS — E1151 Type 2 diabetes mellitus with diabetic peripheral angiopathy without gangrene: Secondary | ICD-10-CM | POA: Diagnosis not present

## 2016-06-17 DIAGNOSIS — J449 Chronic obstructive pulmonary disease, unspecified: Secondary | ICD-10-CM | POA: Diagnosis not present

## 2016-06-17 DIAGNOSIS — N186 End stage renal disease: Secondary | ICD-10-CM | POA: Diagnosis not present

## 2016-06-17 DIAGNOSIS — I5042 Chronic combined systolic (congestive) and diastolic (congestive) heart failure: Secondary | ICD-10-CM | POA: Diagnosis not present

## 2016-06-17 DIAGNOSIS — E1122 Type 2 diabetes mellitus with diabetic chronic kidney disease: Secondary | ICD-10-CM | POA: Diagnosis not present

## 2016-06-17 DIAGNOSIS — Z89512 Acquired absence of left leg below knee: Secondary | ICD-10-CM | POA: Diagnosis not present

## 2016-06-17 DIAGNOSIS — F17200 Nicotine dependence, unspecified, uncomplicated: Secondary | ICD-10-CM | POA: Diagnosis not present

## 2016-06-17 DIAGNOSIS — Z4781 Encounter for orthopedic aftercare following surgical amputation: Secondary | ICD-10-CM | POA: Diagnosis not present

## 2016-06-17 DIAGNOSIS — M1990 Unspecified osteoarthritis, unspecified site: Secondary | ICD-10-CM | POA: Diagnosis not present

## 2016-06-17 DIAGNOSIS — I120 Hypertensive chronic kidney disease with stage 5 chronic kidney disease or end stage renal disease: Secondary | ICD-10-CM | POA: Diagnosis not present

## 2016-06-17 DIAGNOSIS — E11621 Type 2 diabetes mellitus with foot ulcer: Secondary | ICD-10-CM | POA: Diagnosis not present

## 2016-06-18 DIAGNOSIS — E876 Hypokalemia: Secondary | ICD-10-CM | POA: Diagnosis not present

## 2016-06-18 DIAGNOSIS — D631 Anemia in chronic kidney disease: Secondary | ICD-10-CM | POA: Diagnosis not present

## 2016-06-18 DIAGNOSIS — N186 End stage renal disease: Secondary | ICD-10-CM | POA: Diagnosis not present

## 2016-06-18 DIAGNOSIS — N2581 Secondary hyperparathyroidism of renal origin: Secondary | ICD-10-CM | POA: Diagnosis not present

## 2016-06-19 DIAGNOSIS — I5042 Chronic combined systolic (congestive) and diastolic (congestive) heart failure: Secondary | ICD-10-CM | POA: Diagnosis not present

## 2016-06-19 DIAGNOSIS — E1151 Type 2 diabetes mellitus with diabetic peripheral angiopathy without gangrene: Secondary | ICD-10-CM | POA: Diagnosis not present

## 2016-06-19 DIAGNOSIS — N186 End stage renal disease: Secondary | ICD-10-CM | POA: Diagnosis not present

## 2016-06-19 DIAGNOSIS — Z4781 Encounter for orthopedic aftercare following surgical amputation: Secondary | ICD-10-CM | POA: Diagnosis not present

## 2016-06-19 DIAGNOSIS — I132 Hypertensive heart and chronic kidney disease with heart failure and with stage 5 chronic kidney disease, or end stage renal disease: Secondary | ICD-10-CM | POA: Diagnosis not present

## 2016-06-19 DIAGNOSIS — E1122 Type 2 diabetes mellitus with diabetic chronic kidney disease: Secondary | ICD-10-CM | POA: Diagnosis not present

## 2016-06-20 DIAGNOSIS — N186 End stage renal disease: Secondary | ICD-10-CM | POA: Diagnosis not present

## 2016-06-20 DIAGNOSIS — E876 Hypokalemia: Secondary | ICD-10-CM | POA: Diagnosis not present

## 2016-06-20 DIAGNOSIS — N2581 Secondary hyperparathyroidism of renal origin: Secondary | ICD-10-CM | POA: Diagnosis not present

## 2016-06-20 DIAGNOSIS — D631 Anemia in chronic kidney disease: Secondary | ICD-10-CM | POA: Diagnosis not present

## 2016-06-23 DIAGNOSIS — N186 End stage renal disease: Secondary | ICD-10-CM | POA: Diagnosis not present

## 2016-06-23 DIAGNOSIS — D631 Anemia in chronic kidney disease: Secondary | ICD-10-CM | POA: Diagnosis not present

## 2016-06-23 DIAGNOSIS — E876 Hypokalemia: Secondary | ICD-10-CM | POA: Diagnosis not present

## 2016-06-23 DIAGNOSIS — N2581 Secondary hyperparathyroidism of renal origin: Secondary | ICD-10-CM | POA: Diagnosis not present

## 2016-06-24 DIAGNOSIS — E1151 Type 2 diabetes mellitus with diabetic peripheral angiopathy without gangrene: Secondary | ICD-10-CM | POA: Diagnosis not present

## 2016-06-24 DIAGNOSIS — N186 End stage renal disease: Secondary | ICD-10-CM | POA: Diagnosis not present

## 2016-06-24 DIAGNOSIS — E1122 Type 2 diabetes mellitus with diabetic chronic kidney disease: Secondary | ICD-10-CM | POA: Diagnosis not present

## 2016-06-24 DIAGNOSIS — I132 Hypertensive heart and chronic kidney disease with heart failure and with stage 5 chronic kidney disease, or end stage renal disease: Secondary | ICD-10-CM | POA: Diagnosis not present

## 2016-06-24 DIAGNOSIS — I5042 Chronic combined systolic (congestive) and diastolic (congestive) heart failure: Secondary | ICD-10-CM | POA: Diagnosis not present

## 2016-06-24 DIAGNOSIS — Z4781 Encounter for orthopedic aftercare following surgical amputation: Secondary | ICD-10-CM | POA: Diagnosis not present

## 2016-06-25 DIAGNOSIS — E876 Hypokalemia: Secondary | ICD-10-CM | POA: Diagnosis not present

## 2016-06-25 DIAGNOSIS — L97411 Non-pressure chronic ulcer of right heel and midfoot limited to breakdown of skin: Secondary | ICD-10-CM | POA: Diagnosis not present

## 2016-06-25 DIAGNOSIS — N2581 Secondary hyperparathyroidism of renal origin: Secondary | ICD-10-CM | POA: Diagnosis not present

## 2016-06-25 DIAGNOSIS — D631 Anemia in chronic kidney disease: Secondary | ICD-10-CM | POA: Diagnosis not present

## 2016-06-25 DIAGNOSIS — N186 End stage renal disease: Secondary | ICD-10-CM | POA: Diagnosis not present

## 2016-06-25 DIAGNOSIS — S81802A Unspecified open wound, left lower leg, initial encounter: Secondary | ICD-10-CM | POA: Diagnosis not present

## 2016-06-25 DIAGNOSIS — T8789 Other complications of amputation stump: Secondary | ICD-10-CM | POA: Diagnosis not present

## 2016-06-25 DIAGNOSIS — L97412 Non-pressure chronic ulcer of right heel and midfoot with fat layer exposed: Secondary | ICD-10-CM | POA: Diagnosis not present

## 2016-06-25 DIAGNOSIS — I739 Peripheral vascular disease, unspecified: Secondary | ICD-10-CM | POA: Diagnosis not present

## 2016-06-25 DIAGNOSIS — E11621 Type 2 diabetes mellitus with foot ulcer: Secondary | ICD-10-CM | POA: Diagnosis not present

## 2016-06-25 DIAGNOSIS — Z89512 Acquired absence of left leg below knee: Secondary | ICD-10-CM | POA: Diagnosis not present

## 2016-06-26 DIAGNOSIS — E1122 Type 2 diabetes mellitus with diabetic chronic kidney disease: Secondary | ICD-10-CM | POA: Diagnosis not present

## 2016-06-26 DIAGNOSIS — I132 Hypertensive heart and chronic kidney disease with heart failure and with stage 5 chronic kidney disease, or end stage renal disease: Secondary | ICD-10-CM | POA: Diagnosis not present

## 2016-06-26 DIAGNOSIS — E1151 Type 2 diabetes mellitus with diabetic peripheral angiopathy without gangrene: Secondary | ICD-10-CM | POA: Diagnosis not present

## 2016-06-26 DIAGNOSIS — I5042 Chronic combined systolic (congestive) and diastolic (congestive) heart failure: Secondary | ICD-10-CM | POA: Diagnosis not present

## 2016-06-26 DIAGNOSIS — N186 End stage renal disease: Secondary | ICD-10-CM | POA: Diagnosis not present

## 2016-06-26 DIAGNOSIS — Z4781 Encounter for orthopedic aftercare following surgical amputation: Secondary | ICD-10-CM | POA: Diagnosis not present

## 2016-06-27 ENCOUNTER — Ambulatory Visit (INDEPENDENT_AMBULATORY_CARE_PROVIDER_SITE_OTHER): Payer: Medicare Other | Admitting: Pharmacist Clinician (PhC)/ Clinical Pharmacy Specialist

## 2016-06-27 DIAGNOSIS — N2581 Secondary hyperparathyroidism of renal origin: Secondary | ICD-10-CM | POA: Diagnosis not present

## 2016-06-27 DIAGNOSIS — I5042 Chronic combined systolic (congestive) and diastolic (congestive) heart failure: Secondary | ICD-10-CM | POA: Diagnosis not present

## 2016-06-27 DIAGNOSIS — I132 Hypertensive heart and chronic kidney disease with heart failure and with stage 5 chronic kidney disease, or end stage renal disease: Secondary | ICD-10-CM | POA: Diagnosis not present

## 2016-06-27 DIAGNOSIS — Z7901 Long term (current) use of anticoagulants: Secondary | ICD-10-CM

## 2016-06-27 DIAGNOSIS — D631 Anemia in chronic kidney disease: Secondary | ICD-10-CM | POA: Diagnosis not present

## 2016-06-27 DIAGNOSIS — N186 End stage renal disease: Secondary | ICD-10-CM | POA: Diagnosis not present

## 2016-06-27 DIAGNOSIS — E876 Hypokalemia: Secondary | ICD-10-CM | POA: Diagnosis not present

## 2016-06-27 DIAGNOSIS — E1151 Type 2 diabetes mellitus with diabetic peripheral angiopathy without gangrene: Secondary | ICD-10-CM | POA: Diagnosis not present

## 2016-06-27 DIAGNOSIS — E1122 Type 2 diabetes mellitus with diabetic chronic kidney disease: Secondary | ICD-10-CM | POA: Diagnosis not present

## 2016-06-27 DIAGNOSIS — Z4781 Encounter for orthopedic aftercare following surgical amputation: Secondary | ICD-10-CM | POA: Diagnosis not present

## 2016-06-27 LAB — POCT INR: INR: 4.4

## 2016-06-30 ENCOUNTER — Emergency Department (HOSPITAL_COMMUNITY): Payer: Medicare Other

## 2016-06-30 ENCOUNTER — Encounter (HOSPITAL_COMMUNITY): Payer: Self-pay | Admitting: Emergency Medicine

## 2016-06-30 ENCOUNTER — Inpatient Hospital Stay (HOSPITAL_COMMUNITY)
Admission: EM | Admit: 2016-06-30 | Discharge: 2016-07-10 | DRG: 239 | Disposition: A | Discharge status: Rehabilitation Facility | Payer: Medicare Other | Attending: Family Medicine | Admitting: Family Medicine

## 2016-06-30 DIAGNOSIS — Z7982 Long term (current) use of aspirin: Secondary | ICD-10-CM

## 2016-06-30 DIAGNOSIS — Z7901 Long term (current) use of anticoagulants: Secondary | ICD-10-CM | POA: Diagnosis not present

## 2016-06-30 DIAGNOSIS — L0390 Cellulitis, unspecified: Secondary | ICD-10-CM | POA: Diagnosis present

## 2016-06-30 DIAGNOSIS — Z951 Presence of aortocoronary bypass graft: Secondary | ICD-10-CM

## 2016-06-30 DIAGNOSIS — D638 Anemia in other chronic diseases classified elsewhere: Secondary | ICD-10-CM | POA: Diagnosis not present

## 2016-06-30 DIAGNOSIS — K219 Gastro-esophageal reflux disease without esophagitis: Secondary | ICD-10-CM | POA: Diagnosis present

## 2016-06-30 DIAGNOSIS — N2581 Secondary hyperparathyroidism of renal origin: Secondary | ICD-10-CM | POA: Diagnosis present

## 2016-06-30 DIAGNOSIS — E11621 Type 2 diabetes mellitus with foot ulcer: Secondary | ICD-10-CM | POA: Diagnosis present

## 2016-06-30 DIAGNOSIS — I739 Peripheral vascular disease, unspecified: Secondary | ICD-10-CM | POA: Diagnosis not present

## 2016-06-30 DIAGNOSIS — Z888 Allergy status to other drugs, medicaments and biological substances status: Secondary | ICD-10-CM | POA: Diagnosis not present

## 2016-06-30 DIAGNOSIS — S91301A Unspecified open wound, right foot, initial encounter: Secondary | ICD-10-CM | POA: Diagnosis not present

## 2016-06-30 DIAGNOSIS — E876 Hypokalemia: Secondary | ICD-10-CM | POA: Diagnosis not present

## 2016-06-30 DIAGNOSIS — K5900 Constipation, unspecified: Secondary | ICD-10-CM | POA: Diagnosis present

## 2016-06-30 DIAGNOSIS — E118 Type 2 diabetes mellitus with unspecified complications: Secondary | ICD-10-CM | POA: Diagnosis not present

## 2016-06-30 DIAGNOSIS — G8929 Other chronic pain: Secondary | ICD-10-CM | POA: Diagnosis present

## 2016-06-30 DIAGNOSIS — E43 Unspecified severe protein-calorie malnutrition: Secondary | ICD-10-CM | POA: Diagnosis present

## 2016-06-30 DIAGNOSIS — I5042 Chronic combined systolic (congestive) and diastolic (congestive) heart failure: Secondary | ICD-10-CM | POA: Diagnosis present

## 2016-06-30 DIAGNOSIS — Z794 Long term (current) use of insulin: Secondary | ICD-10-CM | POA: Diagnosis not present

## 2016-06-30 DIAGNOSIS — I70261 Atherosclerosis of native arteries of extremities with gangrene, right leg: Secondary | ICD-10-CM | POA: Diagnosis not present

## 2016-06-30 DIAGNOSIS — E11649 Type 2 diabetes mellitus with hypoglycemia without coma: Secondary | ICD-10-CM | POA: Diagnosis present

## 2016-06-30 DIAGNOSIS — T814XXA Infection following a procedure, initial encounter: Secondary | ICD-10-CM | POA: Diagnosis not present

## 2016-06-30 DIAGNOSIS — I2510 Atherosclerotic heart disease of native coronary artery without angina pectoris: Secondary | ICD-10-CM | POA: Diagnosis present

## 2016-06-30 DIAGNOSIS — I132 Hypertensive heart and chronic kidney disease with heart failure and with stage 5 chronic kidney disease, or end stage renal disease: Secondary | ICD-10-CM | POA: Diagnosis present

## 2016-06-30 DIAGNOSIS — E440 Moderate protein-calorie malnutrition: Secondary | ICD-10-CM | POA: Diagnosis not present

## 2016-06-30 DIAGNOSIS — Z885 Allergy status to narcotic agent status: Secondary | ICD-10-CM | POA: Diagnosis not present

## 2016-06-30 DIAGNOSIS — L03115 Cellulitis of right lower limb: Secondary | ICD-10-CM

## 2016-06-30 DIAGNOSIS — E1152 Type 2 diabetes mellitus with diabetic peripheral angiopathy with gangrene: Principal | ICD-10-CM | POA: Diagnosis present

## 2016-06-30 DIAGNOSIS — E1142 Type 2 diabetes mellitus with diabetic polyneuropathy: Secondary | ICD-10-CM | POA: Diagnosis not present

## 2016-06-30 DIAGNOSIS — D72828 Other elevated white blood cell count: Secondary | ICD-10-CM | POA: Diagnosis not present

## 2016-06-30 DIAGNOSIS — D72829 Elevated white blood cell count, unspecified: Secondary | ICD-10-CM | POA: Diagnosis not present

## 2016-06-30 DIAGNOSIS — E1122 Type 2 diabetes mellitus with diabetic chronic kidney disease: Secondary | ICD-10-CM | POA: Diagnosis present

## 2016-06-30 DIAGNOSIS — Z993 Dependence on wheelchair: Secondary | ICD-10-CM

## 2016-06-30 DIAGNOSIS — J42 Unspecified chronic bronchitis: Secondary | ICD-10-CM | POA: Diagnosis not present

## 2016-06-30 DIAGNOSIS — I70209 Unspecified atherosclerosis of native arteries of extremities, unspecified extremity: Secondary | ICD-10-CM | POA: Diagnosis not present

## 2016-06-30 DIAGNOSIS — Z952 Presence of prosthetic heart valve: Secondary | ICD-10-CM | POA: Diagnosis not present

## 2016-06-30 DIAGNOSIS — N185 Chronic kidney disease, stage 5: Secondary | ICD-10-CM | POA: Diagnosis not present

## 2016-06-30 DIAGNOSIS — Z4781 Encounter for orthopedic aftercare following surgical amputation: Secondary | ICD-10-CM | POA: Diagnosis present

## 2016-06-30 DIAGNOSIS — R0602 Shortness of breath: Secondary | ICD-10-CM | POA: Diagnosis not present

## 2016-06-30 DIAGNOSIS — L089 Local infection of the skin and subcutaneous tissue, unspecified: Secondary | ICD-10-CM

## 2016-06-30 DIAGNOSIS — S88111D Complete traumatic amputation at level between knee and ankle, right lower leg, subsequent encounter: Secondary | ICD-10-CM | POA: Diagnosis not present

## 2016-06-30 DIAGNOSIS — I120 Hypertensive chronic kidney disease with stage 5 chronic kidney disease or end stage renal disease: Secondary | ICD-10-CM | POA: Diagnosis not present

## 2016-06-30 DIAGNOSIS — E1129 Type 2 diabetes mellitus with other diabetic kidney complication: Secondary | ICD-10-CM | POA: Diagnosis not present

## 2016-06-30 DIAGNOSIS — N186 End stage renal disease: Secondary | ICD-10-CM | POA: Diagnosis present

## 2016-06-30 DIAGNOSIS — Z944 Liver transplant status: Secondary | ICD-10-CM | POA: Diagnosis not present

## 2016-06-30 DIAGNOSIS — I10 Essential (primary) hypertension: Secondary | ICD-10-CM | POA: Diagnosis not present

## 2016-06-30 DIAGNOSIS — G8918 Other acute postprocedural pain: Secondary | ICD-10-CM | POA: Diagnosis not present

## 2016-06-30 DIAGNOSIS — Z992 Dependence on renal dialysis: Secondary | ICD-10-CM | POA: Diagnosis not present

## 2016-06-30 DIAGNOSIS — E039 Hypothyroidism, unspecified: Secondary | ICD-10-CM | POA: Diagnosis present

## 2016-06-30 DIAGNOSIS — I998 Other disorder of circulatory system: Secondary | ICD-10-CM | POA: Diagnosis not present

## 2016-06-30 DIAGNOSIS — F17210 Nicotine dependence, cigarettes, uncomplicated: Secondary | ICD-10-CM | POA: Diagnosis present

## 2016-06-30 DIAGNOSIS — T8789 Other complications of amputation stump: Secondary | ICD-10-CM | POA: Diagnosis present

## 2016-06-30 DIAGNOSIS — Z681 Body mass index (BMI) 19 or less, adult: Secondary | ICD-10-CM | POA: Diagnosis not present

## 2016-06-30 DIAGNOSIS — Z89511 Acquired absence of right leg below knee: Secondary | ICD-10-CM

## 2016-06-30 DIAGNOSIS — F419 Anxiety disorder, unspecified: Secondary | ICD-10-CM | POA: Diagnosis present

## 2016-06-30 DIAGNOSIS — Z886 Allergy status to analgesic agent status: Secondary | ICD-10-CM | POA: Diagnosis not present

## 2016-06-30 DIAGNOSIS — D62 Acute posthemorrhagic anemia: Secondary | ICD-10-CM | POA: Diagnosis not present

## 2016-06-30 DIAGNOSIS — F329 Major depressive disorder, single episode, unspecified: Secondary | ICD-10-CM | POA: Diagnosis present

## 2016-06-30 DIAGNOSIS — S88912A Complete traumatic amputation of left lower leg, level unspecified, initial encounter: Secondary | ICD-10-CM | POA: Diagnosis not present

## 2016-06-30 DIAGNOSIS — D631 Anemia in chronic kidney disease: Secondary | ICD-10-CM | POA: Diagnosis not present

## 2016-06-30 DIAGNOSIS — I700 Atherosclerosis of aorta: Secondary | ICD-10-CM | POA: Diagnosis present

## 2016-06-30 DIAGNOSIS — L89619 Pressure ulcer of right heel, unspecified stage: Secondary | ICD-10-CM | POA: Diagnosis present

## 2016-06-30 DIAGNOSIS — Z79899 Other long term (current) drug therapy: Secondary | ICD-10-CM

## 2016-06-30 DIAGNOSIS — E11628 Type 2 diabetes mellitus with other skin complications: Secondary | ICD-10-CM | POA: Diagnosis present

## 2016-06-30 DIAGNOSIS — J449 Chronic obstructive pulmonary disease, unspecified: Secondary | ICD-10-CM | POA: Diagnosis present

## 2016-06-30 DIAGNOSIS — Z89512 Acquired absence of left leg below knee: Secondary | ICD-10-CM

## 2016-06-30 DIAGNOSIS — M545 Low back pain, unspecified: Secondary | ICD-10-CM | POA: Diagnosis present

## 2016-06-30 DIAGNOSIS — T8640 Unspecified complication of liver transplant: Secondary | ICD-10-CM | POA: Diagnosis not present

## 2016-06-30 DIAGNOSIS — L97419 Non-pressure chronic ulcer of right heel and midfoot with unspecified severity: Secondary | ICD-10-CM | POA: Diagnosis not present

## 2016-06-30 DIAGNOSIS — I509 Heart failure, unspecified: Secondary | ICD-10-CM | POA: Diagnosis present

## 2016-06-30 DIAGNOSIS — T148XXA Other injury of unspecified body region, initial encounter: Secondary | ICD-10-CM

## 2016-06-30 LAB — CBC WITH DIFFERENTIAL/PLATELET
BASOS ABS: 0 10*3/uL (ref 0.0–0.1)
Basophils Relative: 0 %
EOS ABS: 1.2 10*3/uL — AB (ref 0.0–0.7)
Eosinophils Relative: 12 %
HCT: 35.1 % — ABNORMAL LOW (ref 36.0–46.0)
Hemoglobin: 11.7 g/dL — ABNORMAL LOW (ref 12.0–15.0)
Lymphocytes Relative: 23 %
Lymphs Abs: 2.3 10*3/uL (ref 0.7–4.0)
MCH: 29 pg (ref 26.0–34.0)
MCHC: 33.3 g/dL (ref 30.0–36.0)
MCV: 87.1 fL (ref 78.0–100.0)
MONO ABS: 1.2 10*3/uL — AB (ref 0.1–1.0)
MONOS PCT: 12 %
Neutro Abs: 5.4 10*3/uL (ref 1.7–7.7)
Neutrophils Relative %: 53 %
PLATELETS: 172 10*3/uL (ref 150–400)
RBC: 4.03 MIL/uL (ref 3.87–5.11)
RDW: 18.3 % — ABNORMAL HIGH (ref 11.5–15.5)
WBC: 10.1 10*3/uL (ref 4.0–10.5)

## 2016-06-30 LAB — COMPREHENSIVE METABOLIC PANEL
ALBUMIN: 2 g/dL — AB (ref 3.5–5.0)
ALT: 38 U/L (ref 14–54)
AST: 90 U/L — AB (ref 15–41)
Alkaline Phosphatase: 243 U/L — ABNORMAL HIGH (ref 38–126)
Anion gap: 10 (ref 5–15)
BILIRUBIN TOTAL: 1 mg/dL (ref 0.3–1.2)
BUN: 11 mg/dL (ref 6–20)
CHLORIDE: 97 mmol/L — AB (ref 101–111)
CO2: 28 mmol/L (ref 22–32)
CREATININE: 3.54 mg/dL — AB (ref 0.44–1.00)
Calcium: 8.1 mg/dL — ABNORMAL LOW (ref 8.9–10.3)
GFR calc Af Amer: 15 mL/min — ABNORMAL LOW (ref >60)
GFR, EST NON AFRICAN AMERICAN: 13 mL/min — AB (ref >60)
GLUCOSE: 127 mg/dL — AB (ref 65–99)
POTASSIUM: 4 mmol/L (ref 3.5–5.1)
Sodium: 135 mmol/L (ref 135–145)
TOTAL PROTEIN: 6.8 g/dL (ref 6.5–8.1)

## 2016-06-30 LAB — GLUCOSE, CAPILLARY: GLUCOSE-CAPILLARY: 97 mg/dL (ref 65–99)

## 2016-06-30 LAB — PROTIME-INR
INR: 2.61
PROTHROMBIN TIME: 28.4 s — AB (ref 11.4–15.2)

## 2016-06-30 MED ORDER — METHOCARBAMOL 500 MG PO TABS
250.0000 mg | ORAL_TABLET | Freq: Four times a day (QID) | ORAL | Status: DC | PRN
  Administered 2016-07-03 – 2016-07-10 (×3): 250 mg via ORAL
  Filled 2016-06-30 (×3): qty 1

## 2016-06-30 MED ORDER — LEVOTHYROXINE SODIUM 50 MCG PO TABS
175.0000 ug | ORAL_TABLET | Freq: Every day | ORAL | Status: DC
  Administered 2016-07-01 – 2016-07-10 (×9): 175 ug via ORAL
  Filled 2016-06-30 (×9): qty 1

## 2016-06-30 MED ORDER — PREGABALIN 75 MG PO CAPS
75.0000 mg | ORAL_CAPSULE | Freq: Every day | ORAL | Status: DC
  Administered 2016-07-01 – 2016-07-10 (×9): 75 mg via ORAL
  Filled 2016-06-30 (×2): qty 1
  Filled 2016-06-30: qty 3
  Filled 2016-06-30 (×6): qty 1

## 2016-06-30 MED ORDER — MORPHINE SULFATE (PF) 4 MG/ML IV SOLN: 4.0000 mg | Freq: Once | INTRAVENOUS | Status: DC

## 2016-06-30 MED ORDER — OXYCODONE HCL 5 MG PO TABS
5.0000 mg | ORAL_TABLET | Freq: Four times a day (QID) | ORAL | Status: DC | PRN
  Administered 2016-07-01 – 2016-07-08 (×6): 5 mg via ORAL
  Filled 2016-06-30 (×6): qty 1

## 2016-06-30 MED ORDER — ONDANSETRON HCL 4 MG/2ML IJ SOLN
4.0000 mg | Freq: Four times a day (QID) | INTRAMUSCULAR | Status: DC | PRN
  Administered 2016-07-03 – 2016-07-08 (×3): 4 mg via INTRAVENOUS
  Filled 2016-06-30 (×3): qty 2

## 2016-06-30 MED ORDER — SENNOSIDES-DOCUSATE SODIUM 8.6-50 MG PO TABS
1.0000 | ORAL_TABLET | Freq: Every day | ORAL | Status: DC
  Administered 2016-07-01 – 2016-07-03 (×3): 1 via ORAL
  Filled 2016-06-30 (×3): qty 1

## 2016-06-30 MED ORDER — ACETAMINOPHEN 325 MG PO TABS
650.0000 mg | ORAL_TABLET | Freq: Four times a day (QID) | ORAL | Status: DC | PRN
  Filled 2016-06-30: qty 2

## 2016-06-30 MED ORDER — CALCITRIOL 0.5 MCG PO CAPS
0.5000 ug | ORAL_CAPSULE | ORAL | Status: DC
  Administered 2016-07-02 – 2016-07-09 (×3): 0.5 ug via ORAL

## 2016-06-30 MED ORDER — RENA-VITE PO TABS
1.0000 | ORAL_TABLET | Freq: Every day | ORAL | Status: DC
  Administered 2016-07-01 – 2016-07-09 (×9): 1 via ORAL
  Filled 2016-06-30 (×11): qty 1

## 2016-06-30 MED ORDER — WARFARIN SODIUM 2.5 MG PO TABS
1.2500 mg | ORAL_TABLET | Freq: Once | ORAL | Status: AC
  Administered 2016-06-30: 1.25 mg via ORAL
  Filled 2016-06-30: qty 0.5

## 2016-06-30 MED ORDER — WARFARIN - PHARMACIST DOSING INPATIENT: Freq: Every day | Status: DC

## 2016-06-30 MED ORDER — ACETAMINOPHEN 650 MG RE SUPP: 650.0000 mg | Freq: Four times a day (QID) | RECTAL | Status: DC | PRN

## 2016-06-30 MED ORDER — INSULIN ASPART 100 UNIT/ML ~~LOC~~ SOLN: 0.0000 [IU] | Freq: Every day | SUBCUTANEOUS | Status: DC

## 2016-06-30 MED ORDER — ASPIRIN EC 81 MG PO TBEC
81.0000 mg | DELAYED_RELEASE_TABLET | Freq: Every day | ORAL | Status: DC
  Administered 2016-07-01 – 2016-07-10 (×8): 81 mg via ORAL
  Filled 2016-06-30 (×8): qty 1

## 2016-06-30 MED ORDER — SODIUM CHLORIDE 0.9 % IV SOLN: 250.0000 mL | INTRAVENOUS | Status: DC | PRN

## 2016-06-30 MED ORDER — BOOST / RESOURCE BREEZE PO LIQD
1.0000 | Freq: Three times a day (TID) | ORAL | Status: DC
  Administered 2016-06-30 – 2016-07-01 (×3): 1 via ORAL

## 2016-06-30 MED ORDER — POLYETHYLENE GLYCOL 3350 17 G PO PACK
17.0000 g | PACK | Freq: Two times a day (BID) | ORAL | Status: DC
  Administered 2016-06-30 – 2016-07-10 (×14): 17 g via ORAL
  Filled 2016-06-30 (×18): qty 1

## 2016-06-30 MED ORDER — TACROLIMUS 1 MG PO CAPS
2.0000 mg | ORAL_CAPSULE | Freq: Two times a day (BID) | ORAL | Status: DC
  Administered 2016-07-01 – 2016-07-10 (×18): 2 mg via ORAL
  Filled 2016-06-30 (×20): qty 2

## 2016-06-30 MED ORDER — METOPROLOL TARTRATE 25 MG PO TABS
25.0000 mg | ORAL_TABLET | Freq: Two times a day (BID) | ORAL | Status: DC
  Administered 2016-06-30 – 2016-07-10 (×19): 25 mg via ORAL
  Filled 2016-06-30 (×19): qty 1

## 2016-06-30 MED ORDER — MORPHINE SULFATE (PF) 4 MG/ML IV SOLN
4.0000 mg | Freq: Once | INTRAVENOUS | Status: AC
  Administered 2016-06-30: 4 mg via INTRAVENOUS
  Filled 2016-06-30: qty 1

## 2016-06-30 MED ORDER — METOCLOPRAMIDE HCL 5 MG PO TABS
5.0000 mg | ORAL_TABLET | Freq: Two times a day (BID) | ORAL | Status: DC
  Administered 2016-07-01 – 2016-07-10 (×17): 5 mg via ORAL
  Filled 2016-06-30 (×19): qty 1

## 2016-06-30 MED ORDER — PIPERACILLIN-TAZOBACTAM 3.375 G IVPB
3.3750 g | Freq: Two times a day (BID) | INTRAVENOUS | Status: DC
  Administered 2016-06-30: 3.375 g via INTRAVENOUS
  Filled 2016-06-30: qty 50

## 2016-06-30 MED ORDER — INSULIN ASPART 100 UNIT/ML ~~LOC~~ SOLN: 0.0000 [IU] | Freq: Three times a day (TID) | SUBCUTANEOUS | Status: DC

## 2016-06-30 MED ORDER — ALLOPURINOL 100 MG PO TABS
100.0000 mg | ORAL_TABLET | Freq: Every day | ORAL | Status: DC
  Filled 2016-06-30: qty 1

## 2016-06-30 MED ORDER — PANTOPRAZOLE SODIUM 40 MG PO TBEC
40.0000 mg | DELAYED_RELEASE_TABLET | Freq: Every day | ORAL | Status: DC
  Filled 2016-06-30 (×2): qty 1

## 2016-06-30 MED ORDER — DIPHENHYDRAMINE HCL 50 MG/ML IJ SOLN: 25.0000 mg | Freq: Once | INTRAMUSCULAR | Status: DC

## 2016-06-30 MED ORDER — METRONIDAZOLE IN NACL 5-0.79 MG/ML-% IV SOLN
500.0000 mg | Freq: Three times a day (TID) | INTRAVENOUS | Status: DC
  Administered 2016-07-01 – 2016-07-09 (×22): 500 mg via INTRAVENOUS
  Filled 2016-06-30 (×26): qty 100

## 2016-06-30 MED ORDER — HYDROMORPHONE HCL 2 MG PO TABS
2.0000 mg | ORAL_TABLET | Freq: Four times a day (QID) | ORAL | Status: DC | PRN
  Administered 2016-06-30 – 2016-07-09 (×18): 2 mg via ORAL
  Filled 2016-06-30 (×19): qty 1

## 2016-06-30 MED ORDER — SODIUM CHLORIDE 0.9% FLUSH: 3.0000 mL | INTRAVENOUS | Status: DC | PRN

## 2016-06-30 MED ORDER — ONDANSETRON HCL 4 MG PO TABS
4.0000 mg | ORAL_TABLET | Freq: Four times a day (QID) | ORAL | Status: DC | PRN
  Administered 2016-07-01: 4 mg via ORAL
  Filled 2016-06-30: qty 1

## 2016-06-30 MED ORDER — DEXTROSE 5 % IV SOLN
2.0000 g | INTRAVENOUS | Status: DC
  Administered 2016-07-01 – 2016-07-10 (×9): 2 g via INTRAVENOUS
  Filled 2016-06-30 (×11): qty 2

## 2016-06-30 MED ORDER — SODIUM CHLORIDE 0.9% FLUSH
3.0000 mL | Freq: Two times a day (BID) | INTRAVENOUS | Status: DC
  Administered 2016-06-30 – 2016-07-01 (×3): 3 mL via INTRAVENOUS

## 2016-06-30 NOTE — ED Triage Notes (Signed)
Pt here for wound infection to left great toe with purulent drainage and odor; pt sts pain in toe; pt had full dialysis treatment today

## 2016-06-30 NOTE — ED Notes (Signed)
Patient transported to X-ray 

## 2016-06-30 NOTE — H&P (Signed)
History and Physical    [name] YQM:[id number] DOB: 28-Sep-1955 DOA: 06/30/2016  PCP: [name], MD   Patient coming from: Home  Chief Complaint: Right foot and left BKA stump pain and swelling   HPI: [name] is a 61 y.o. female with medical history significant for hypertension, diabetes, coronary artery disease, chronic back pain, depression, end-stage renal disease on HD, mechanical aortic valve on warfarin, and history of liver transplantation who presents to the emergency department for evaluation of wounds on the right foot and left BKA stump. Patient reports that she been in her usual state of health and had been receiving wound care as an outpatient, but was sent to the emergency department after dialysis today when her nurse noted the wounds to appear much worse than they had recently. Patient describes a swollen right foot with wounds at the heel and great toe, severe associated pain, and recent purulent drainage from the right great toe. She denies fevers or chills. She describes her pain as severe, sharp, constant, localized to the right foot, and with no alleviating or exacerbating factors identified. She also notes some increased pain and swelling at her left BKA stump, but no heat or drainage from the site. She has not attempted any interventions for her symptoms. She underwent dialysis today and reports completing the full session without incident.  ED Course: Upon arrival to the ED, patient is found to be afebrile, saturating well on room air, mildly hypertensive, and with vitals otherwise stable. Radiographs of the right foot and left BKA stump are negative. Chemistry panel is notable for a chronic elevation in AST and CBC features a stable normocytic anemia with hemoglobin of 11.7. INR is within the therapeutic range at 2.61. Patient was treated with Zosyn in the ED and morphine. She remained hemodynamically stable and in no apparent respiratory distress and will be  admitted to the medical-surgical unit for ongoing evaluation and management of diabetic right foot wound with infection.  Review of Systems:  All other systems reviewed and apart from HPI, are negative.  Past Medical History:  Diagnosis Date  . Anemia    takes Folic Acid daily  . Anxiety   . Aortic stenosis   . Arthritis    "left hand, back" (08/30/2012)  . Asthma   . CAD (coronary artery disease)   . CAD (coronary artery disease) Jan. 2015   Cath: 20% LAD, 50% D1; s/p LIMA-LAD  . Chest pain 03/06/2015  . CHF (congestive heart failure) ([place]) 05/2016  . Chronic back pain   . Chronic bronchitis ([place])    "q yr w/season changes" (08/30/2012)  . Chronic constipation    takes MIralax and Colace daily  . COPD (chronic obstructive pulmonary disease) ([place])   . Depression    takes Cymbalta for "severe" depression  . End stage renal disease on dialysis [facility]) 02/27/2011   Kidneys shut down at time of liver transplant in Sept 2011 at [facility] in [place], she has been on HD ever since.  Dialyzes at [facility] HD on TTS schedule.  Had L forearm graft used 10 months then removed Dec 2012 due to suspected infection.  A right upper arm AVG was placed Dec 2012 but she developed steal symptoms acutely and it was ligated the same day.  Never had an AV fistula due to small veins.  Now has L thigh AVG put in Jan 2013, has not clotted to date.    [name] GERD (gastroesophageal reflux disease)  takes Omeprazole daily  . Headache    "at least monthly" (08/30/2012)  . Hepatitis C   . History of blood transfusion    "several" (08/30/2012)  . Hypertension    takes Metoprolol and Lisinopril daily, sees Dr Bea Graff  . Hypothyroidism    takes Synthroid daily  . Migraine    "last migraine was in 2013" (08/30/2012)  . Neuromuscular disorder ([place])    carpal tunnel in right hand  . Obesity   . Peripheral vascular disease (HCC) hands and legs  . Pneumonia    "today and several times before" (08/30/2012)  . S/P aortic  valve replacement 03/15/13   Mechanical   . 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 S/P unilateral BKA (below knee amputation), left ([place]) 04/30/2016  . SVT (supraventricular tachycardia) ([place]) 06/09/14  . Tobacco abuse   . Type II diabetes mellitus (HCC)    Levemir 2units daily if > 150    Past Surgical History:  Procedure Laterality Date  . AMPUTATION Left 04/24/2016   Procedure: AMPUTATION BELOW KNEE;  Surgeon: Angelia Mould, MD;  Location: [place];  Service: Vascular;  Laterality: Left;  . AORTIC VALVE REPLACEMENT N/A 03/15/2013   AVR; Surgeon: Ivin Poot, MD;  Location: [facility] OR; Open Heart Surgery;  30mm[employer] mechanical prosthesis, top hat valve  . ARTERIOVENOUS GRAFT PLACEMENT Left 10/03/10    forearm  . AV FISTULA PLACEMENT  01/29/2011   Procedure: INSERTION OF ARTERIOVENOUS (AV) GORE-TEX GRAFT ARM;  Surgeon: Elam Dutch, MD;  Location[facility] OR;  Service: Vascular;  Laterality: Right;  . AV FISTULA PLACEMENT  03/10/2011   Procedure: INSERTION OF ARTERIOVENOUS (AV) GORE-TEX GRAFT THIGH;  Surgeon: Elam Dutch, MD;  Location: [place];  Service: Vascular;  Laterality: Left;  . [place] REMOVAL  12/23/2010   Procedure: REMOVAL OF ARTERIOVENOUS GORETEX GRAFT ([place]);  Surgeon: Elam Dutch, MD;  Location: [place];  Service: Vascular;  Laterality: Left;  procedure started @1736 -8032  . CHOLECYSTECTOMY  1993  . CORONARY ARTERY BYPASS GRAFT N/A 03/15/2013   Procedure: CORONARY ARTERY BYPASS GRAFTING (CABG) times one using left internal mammary artery.;  Surgeon: Ivin Poot, MD;  Location: [place];  Service: Open Heart Surgery;  Laterality: N/A;  POSS CABG X 1  . CYSTOSCOPY  1990's  . INSERTION OF DIALYSIS CATHETER  12/23/2010   Procedure: INSERTION OF DIALYSIS CATHETER;  Surgeon: Elam Dutch, MD;  Location: [place];  Service: Vascular;  Laterality: Right;  Right Internal Jugular 28cm dialysis  catheter insertion procedure time 1701-1720   . INSERTION OF DIALYSIS CATHETER Right 03/11/2016   Procedure: INSERTION OF DIALYSIS CATHETER;  Surgeon: Angelia Mould, MD;  Location: [place];  Service: Vascular;  Laterality: Right;  . INTRAOPERATIVE TRANSESOPHAGEAL ECHOCARDIOGRAM N/A 03/15/2013   Procedure: INTRAOPERATIVE TRANSESOPHAGEAL ECHOCARDIOGRAM;  Surgeon: Ivin Poot, MD;  Location: [place];  Service: Open Heart Surgery;  Laterality: N/A;  . LEFT HEART CATHETERIZATION WITH CORONARY ANGIOGRAM N/A 07/29/2012   Procedure: LEFT HEART CATHETERIZATION WITH CORONARY ANGIOGRAM;  Surgeon: Troy Sine, MD;  Location: [facility] C[facility] LAB;  Service: Cardiovascular;  Laterality: N/A;  . LEFT HEART CATHETERIZATION WITH CORONARY ANGIOGRAM N/A 03/10/2013   Procedure: LEFT HEART CATHETERIZATION WITH CORONARY ANGIOGRAM;  Surgeon: Troy Sine, MD;  Location: [facility] C[facility] LAB;  Service: Cardiovascular;  Laterality: N/A;  . LEFT HEART CATHETERIZATION WITH CORONARY/GRAFT ANGIOGRAM  N/A 12/24/2011   Procedure: LEFT HEART CATHETERIZATION WITH Beatrix Fetters;  Surgeon: [name], MD;  Location: [facility] CATH LAB;  Service: Cardiovascular;  Laterality: N/A;  . LEFT HEART CATHETERIZATION WITH CORONARY/GRAFT ANGIOGRAM N/A 12/16/2013   Procedure: LEFT HEART CATHETERIZATION WITH Beatrix Fetters;  Surgeon: Troy Sine, MD;  Location: [facility] CATH LAB;  Service: Cardiovascular;  Laterality: N/A;  . LIGATION ARTERIOVENOUS GORTEX GRAFT Left 03/11/2016   Procedure: LIGATION THIGH ARTERIOVENOUS GORTEX GRAFT;  Surgeon: Angelia Mould, MD;  Location: [place];  Service: Vascular;  Laterality: Left;  . LIVER TRANSPLANT  10/25/2009   sees Dr Ferol Luz 1 every 6 months, saw last in Dec 2013. [name] Coord [phone]  . PERIPHERAL VASCULAR CATHETERIZATION N/A 11/06/2015   Procedure: Abdominal Aortogram;  Surgeon: Serafina Mitchell, MD;  Location: [place] CV LAB;  Service: Cardiovascular;  Laterality: N/A;  .  PERIPHERAL VASCULAR CATHETERIZATION N/A 11/06/2015   Procedure: Lower Extremity Angiography;  Surgeon: Serafina Mitchell, MD;  Location: [place] CV LAB;  Service: Cardiovascular;  Laterality: N/A;  . PERIPHERAL VASCULAR CATHETERIZATION  11/06/2015   Procedure: Peripheral Vascular Atherectomy;  Surgeon: Serafina Mitchell, MD;  Location: [place] CV LAB;  Service: Cardiovascular;;  Left Superficial femoral  . SHUNTOGRAM Left 05/15/2014   Procedure: SHUNTOGRAM;  Surgeon: Conrad Lueders, MD;  Location: [facility] CATH LAB;  Service: Cardiovascular;  Laterality: Left;  . SMALL INTESTINE SURGERY  90's  . SPINAL GROWTH RODS  2010   "put 2 metal rods in my back; they had detetriorated" (08/30/2012)  . THROMBECTOMY    . THROMBECTOMY AND REVISION OF ARTERIOVENTOUS (AV) GORETEX  GRAFT Left 03/30/2014  . THROMBECTOMY AND REVISION OF ARTERIOVENTOUS (AV) GORETEX  GRAFT Left 03/30/2014   Procedure: THROMBECTOMY AND REVISION OF ARTERIOVENTOUS (AV) GORETEX  GRAFT;  Surgeon: Conrad [place], MD;  Location: [place];  Service: Vascular;  Laterality: Left;  . TUBAL LIGATION  1990's     reports that she has been smoking Cigarettes.  She has a 20.00 pack-year smoking history. She has never used smokeless tobacco. She reports that she does not drink alcohol or use drugs.  Allergies  Allergen Reactions  . Acetaminophen Other (See Comments)    Liver transplant recipient   . Morphine Itching and Other (See Comments)    hallucinate  . Codeine Itching  . Mirtazapine Other (See Comments)    hallucination    Family History  Problem Relation Age of Onset  . Cancer Mother   . Diabetes Mother   . Hypertension Mother   . Stroke Mother   . Cancer Father   . Anesthesia problems Neg Hx   . Hypotension Neg Hx   . Malignant hyperthermia Neg Hx   . Pseudochol deficiency Neg Hx      Prior to Admission medications   Medication Sig Start Date End Date Taking? Authorizing Provider  allopurinol (ZYLOPRIM) 100 MG tablet Take 100 mg by  mouth daily. 04/16/16   [provider]  aspirin EC 81 MG tablet Take 81 mg by mouth daily.    [provider]  calcitRIOL (ROCALTROL) 0.5 MCG capsule Take 1 capsule (0.5 mcg total) by mouth every Monday, Wednesday, and Friday with hemodialysis. 04/25/16   Reyne Dumas, MD  feeding supplement (BOOST / RESOURCE BREEZE) LIQD Take 1 Container by mouth 3 (three) times daily between meals. 04/25/16   Reyne Dumas, MD  HYDROmorphone (DILAUDID) 2 MG tablet Take 1 tablet (2 mg total) by mouth at bedtime as  needed for severe pain. 05/20/16   Love, Ivan Anchors, PA-C  levothyroxine (SYNTHROID, LEVOTHROID) 175 MCG tablet TAKE 1 TABLET (175 MCG TOTAL) BY MOUTH DAILY BEFORE BREAKFAST. 04/01/16   [provider]  methocarbamol (ROBAXIN) 500 MG tablet Take 0.5 tablets (250 mg total) by mouth every 6 (six) hours as needed for muscle spasms. 05/20/16   Love, Ivan Anchors, PA-C  metoCLOPramide (REGLAN) 5 MG tablet Take 1 tablet (5 mg total) by mouth 2 (two) times daily before a meal. Decrease to once a day before breakfast after a couple of weeks. 05/20/16   Love, Ivan Anchors, PA-C  metoprolol tartrate (LOPRESSOR) 25 MG tablet Take 1 tablet (25 mg total) by mouth 2 (two) times daily. 05/20/16   Love, Ivan Anchors, PA-C  multivitamin (RENA-VIT) TABS tablet Take 1 tablet by mouth at bedtime. 05/20/16   Love, Ivan Anchors, PA-C  nitroGLYCERIN (NITROSTAT) 0.4 MG SL tablet Place 1 tablet (0.4 mg total) under the tongue every 5 (five) minutes as needed for chest pain. 03/20/14   Elgergawy, Silver Huguenin, MD  ondansetron (ZOFRAN ODT) 4 MG disintegrating tablet Take 1 tablet (4 mg total) by mouth every 8 (eight) hours as needed for nausea. 05/20/16   Love, Ivan Anchors, PA-C  oxyCODONE (OXY IR/ROXICODONE) 5 MG immediate release tablet Take 1 tablet (5 mg total) by mouth 2 (two) times daily as needed for severe pain or breakthrough pain. 05/20/16   Love, Ivan Anchors, PA-C  pantoprazole (PROTONIX) 40 MG tablet Take 1 tablet (40 mg total) by mouth  daily. 05/20/16   Love, Ivan Anchors, PA-C  polyethylene glycol (MIRALAX / GLYCOLAX) packet Take 17 g by mouth 2 (two) times daily. 05/20/16   Love, Ivan Anchors, PA-C  pregabalin (LYRICA) 75 MG capsule Take 1 capsule (75 mg total) by mouth daily. 05/21/16   Love, Ivan Anchors, PA-C  senna-docusate (SENOKOT-S) 8.6-50 MG tablet Take 1 tablet by mouth daily.    [provider]  tacrolimus (PROGRAF) 1 MG capsule Take 2 capsules (2 mg total) by mouth 2 (two) times daily. Take 2 capsules twice daily. Patient taking differently: Take 1-2 mg by mouth See admin instructions. Take 2 mg by mouth in the morning and take 1 mg by mouth at bedtime 11/29/15   Cristal Ford, DO  traMADol (ULTRAM) 50 MG tablet Take 1 tablet (50 mg total) by mouth every 6 (six) hours as needed for moderate pain. 05/20/16   Bary Leriche, PA-C  warfarin (COUMADIN) 2.5 MG tablet Take 1 tablet (2.5 mg total) by mouth daily at 6 PM. 05/20/16   Bary Leriche, PA-C    Physical Exam: Vitals:   06/30/16 1555 06/30/16 1853 06/30/16 1900 06/30/16 1917  BP: (!) 121/57 131/65 134/60   Pulse: 79   81  Resp: 18     Temp:      TempSrc:      SpO2: 100%         Constitutional: NAD, calm, appears chronically-ill and older than stated age Eyes: PERTLA, lids and conjunctivae normal ENMT: Mucous membranes are moist. Posterior pharynx clear of any exudate or lesions.   Neck: normal, supple, no masses, no thyromegaly Respiratory: clear to auscultation bilaterally, no wheezing, no crackles. Normal respiratory effort.   Cardiovascular: S1 & S2 heard, regular rate and rhythm. No significant JVD. Abdomen: No distension, no tenderness, no masses palpated. Bowel sounds active.  Musculoskeletal: Status-post left BKA. No other gross deformity.  Skin: Right foot swollen and tender with ulcerations at heel and  plantar 1st toe and foul odor. Left BKA stump slightly edematous and tender. Warm, dry, well-perfused. Neurologic: CN 2-12 grossly intact.  Sensation intact, DTR normal. Strength 5/5 in all 4 limbs.  Psychiatric: Alert and oriented x 3. Calm and cooperative.     Labs on Admission: I have personally reviewed following labs and imaging studies  CBC:  Recent Labs Lab 06/30/16 1845  WBC 10.1  NEUTROABS 5.4  HGB 11.7*  HCT 35.1*  MCV 87.1  PLT 937   Basic Metabolic Panel:  Recent Labs Lab 06/30/16 1845  NA 135  K 4.0  CL 97*  CO2 28  GLUCOSE 127*  BUN 11  CREATININE 3.54*  CALCIUM 8.1*   GFR: CrCl cannot be calculated (Unknown ideal weight.). Liver Function Tests:  Recent Labs Lab 06/30/16 1845  AST 90*  ALT 38  ALKPHOS 243*  BILITOT 1.0  PROT 6.8  ALBUMIN 2.0*   No results for input(s): LIPASE, AMYLASE in the last 168 hours. No results for input(s): AMMONIA in the last 168 hours. Coagulation Profile:  Recent Labs Lab 06/27/16 06/30/16 1901  INR 4.4 2.61   Cardiac Enzymes: No results for input(s): CKTOTAL, CKMB, CKMBINDEX, TROPONINI in the last 168 hours. BNP (last 3 results) No results for input(s): PROBNP in the last 8760 hours. HbA1C: No results for input(s): HGBA1C in the last 72 hours. CBG: No results for input(s): GLUCAP in the last 168 hours. Lipid Profile: No results for input(s): CHOL, HDL, LDLCALC, TRIG, CHOLHDL, LDLDIRECT in the last 72 hours. Thyroid Function Tests: No results for input(s): TSH, T4TOTAL, FREET4, T3FREE, THYROIDAB in the last 72 hours. Anemia Panel: No results for input(s): VITAMINB12, FOLATE, FERRITIN, TIBC, IRON, RETICCTPCT in the last 72 hours. Urine analysis:    Component Value Date/Time   COLORURINE YELLOW 11/02/2012 0428   APPEARANCEUR CLOUDY (A) 11/02/2012 0428   LABSPEC 1.011 11/02/2012 0428   PHURINE 8.0 11/02/2012 0428   GLUCOSEU 250 (A) 11/02/2012 0428   HGBUR LARGE (A) 11/02/2012 0428   BILIRUBINUR NEGATIVE 11/02/2012 0428   KETONESUR NEGATIVE 11/02/2012 0428   PROTEINUR >300 (A) 11/02/2012 0428   UROBILINOGEN 0.2 11/02/2012 0428    NITRITE NEGATIVE 11/02/2012 0428   LEUKOCYTESUR NEGATIVE 11/02/2012 0428   Sepsis Labs: @LABRCNTIP (procalcitonin:4,lacticidven:4) )No results found for this or any previous visit (from the past 240 hour(s)).   Radiological Exams on Admission: Dg Tibia/fibula Left  Result Date: 06/30/2016 CLINICAL DATA:  Amputation wound, check for bony destruction EXAM: LEFT TIBIA AND FIBULA - 2 VIEW COMPARISON:  None. FINDINGS: Changes consistent with a below the knee amputation are seen. Mild osteopenia is noted. No bony erosion to suggest osteomyelitis is seen. IMPRESSION: No evidence of osteomyelitis. Electronically Signed   By: Inez Catalina M.D.   On: 06/30/2016 18:48   Dg Foot Complete Right  Result Date: 06/30/2016 CLINICAL DATA:  Open wound that will not heal since April. Wounds of the first digit and heel. EXAM: RIGHT FOOT COMPLETE - 3+ VIEW COMPARISON:  None. FINDINGS: The patient's heel wound is subtly visible. No soft tissue emphysema or opaque foreign body. No erosion to suggest osteomyelitis. Osteopenia and diffuse arterially calcification. IMPRESSION: No evidence of osteomyelitis or opaque foreign body. Electronically Signed   By: Monte Fantasia M.D.   On: 06/30/2016 13:41    EKG: Not performed.   Assessment/Plan  1. Diabetic foot wound with infection - Pt presents with worsening wounds to right heel and right great toe, also concerned for wound at left BKA stump  -  There is no fever or leukocytosis present; suspect PAD is contributing to ulcer with poor-healing, complicated by secondary infection  - Radiographs negative  - She was treated with Zosyn in ED  - Plan to check ABI, continue empiric abx with Rocephin and Flagyl    2. ESRD  - Pt reports completing HD on 5/21 without incident  - She is dialyzed via catheter in right chest  - Potassium is normal, no acidosis or volume issues, no uremia  - If remains in hospital 5/23, will be due for maintenance HD  3. CAD - No anginal  complaints  - Continue Lopressor, ASA   4. Hx of mechanical aortic valve - Continue warfarin with pharmacy assistance  - INR 2.61 on admission, goal 2.5-3.5    5. Hypertension  - BP at goal  - Continue Lopressor    6. Chronic pain - Pt with stable chronic back pain  - Continue home regimen with Robaxin, oxycodone, and oral Dilaudid prn    7. Hx of liver transplantation - Stable  - Continue Prograf    DVT prophylaxis: warfarin  Code Status: Partial, no intubation  Family Communication: Discussed with patient Disposition Plan: Admit to med-surg Consults called: None Admission status: Inpatient    Vianne Bulls, MD [facility] Pager [phone]  If 7PM-7AM, please contact night-coverage [email or web address] Password [facility]  06/30/2016, 8:27 PM

## 2016-06-30 NOTE — ED Notes (Signed)
ED Provider at bedside. 

## 2016-06-30 NOTE — ED Provider Notes (Signed)
[facility] DEPT Provider Note   CSN: [id number] Arrival date & time: 06/30/16  1253     History   Chief Complaint Chief Complaint  Patient presents with  . Wound Check    HPI [name] is a 61 y.o. female.  HPI    [name] is a 61 y.o. female, with a history of ESRD on dialysis, CAD, CHF, asthma, HTN, and DM, presenting to the ED with worsening pain surrounding multiple wounds beginning two days ago. Patient states she receives wound care for wounds to her right great toe, right heel, and to her left BKA site. She went to dialysis today and the dialysis nurse told the patient that the wounds "didn't look good." Pain is now severe. States her BKA wound doesn't usually have discharge from it. Denies fever/chills, nausea/vomiting, recent trauma, acute neuro deficits, or any other complaints.   Left BKA performed by Dr. Scot Dock, vascular surgery, on 04/24/2016. Patient's BKA site was reevaluated by Dr. Scot Dock on April 17 during an admission for respiratory failure. Patient's dialysis schedule is Monday, Wednesday, and Friday.    Past Medical History:  Diagnosis Date  . Anemia    takes Folic Acid daily  . Anxiety   . Aortic stenosis   . Arthritis    "left hand, back" (08/30/2012)  . Asthma   . CAD (coronary artery disease)   . CAD (coronary artery disease) Jan. 2015   Cath: 20% LAD, 50% D1; s/p LIMA-LAD  . Chest pain 03/06/2015  . CHF (congestive heart failure) ([place]) 05/2016  . Chronic back pain   . Chronic bronchitis ([place])    "q yr w/season changes" (08/30/2012)  . Chronic constipation    takes MIralax and Colace daily  . COPD (chronic obstructive pulmonary disease) ([place])   . Depression    takes Cymbalta for "severe" depression  . End stage renal disease on dialysis [facility]) 02/27/2011   Kidneys shut down at time of liver transplant in Sept 2011 at [facility] in [place], she has been on HD ever since.  Dialyzes at [facility] HD on TTS schedule.  Had L forearm  graft used 10 months then removed Dec 2012 due to suspected infection.  A right upper arm AVG was placed Dec 2012 but she developed steal symptoms acutely and it was ligated the same day.  Never had an AV fistula due to small veins.  Now has L thigh AVG put in Jan 2013, has not clotted to date.    [name] GERD (gastroesophageal reflux disease)    takes Omeprazole daily  . Headache    "at least monthly" (08/30/2012)  . Hepatitis C   . History of blood transfusion    "several" (08/30/2012)  . Hypertension    takes Metoprolol and Lisinopril daily, sees Dr Bea Graff  . Hypothyroidism    takes Synthroid daily  . Migraine    "last migraine was in 2013" (08/30/2012)  . Neuromuscular disorder ([place])    carpal tunnel in right hand  . Obesity   . Peripheral vascular disease (HCC) hands and legs  . Pneumonia    "today and several times before" (08/30/2012)  . 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 S/P unilateral BKA (below knee amputation), left ([place]) 04/30/2016  . SVT (supraventricular tachycardia) ([place]) 06/09/14  . Tobacco abuse   .  Type II diabetes mellitus (HCC)    Levemir 2units daily if > 150    Patient Active Problem List   Diagnosis Date Noted  . Cellulitis 06/30/2016  . PAD (peripheral artery disease) ([place]) 06/30/2016  . Pressure injury of skin 05/28/2016  . Volume overload 05/28/2016  . Acute exacerbation of CHF (congestive heart failure) ([place]) 05/27/2016  . Extremity atherosclerosis with resting pain ([place])   . H/O heart valve replacement with mechanical valve   . Phantom limb pain ([place])   . Subtherapeutic international normalized ratio (INR)   . Supratherapeutic INR   . Anemia of chronic disease   . Chronic midline low back pain without sciatica   . ESRD on dialysis ([place])   . Type 2 diabetes mellitus with complication, with long-term current use of  insulin ([place])   . S/P unilateral BKA (below knee amputation), left ([place]) 04/30/2016  . Unilateral complete BKA, left, initial encounter ([place]) 04/30/2016  . Abnormality of gait   . Coronary artery disease involving coronary bypass graft of native heart without angina pectoris   . Benign essential HTN   . History of migraine   . Leukocytosis   . Critical lower limb ischemia 04/22/2016  . Foot pain 04/21/2016  . Fever   . Advance care planning   . Goals of care, counseling/discussion   . Palliative care by specialist   . Sepsis ([place])   . Hypotension   . Claudication of left lower extremity ([place])   . Elevated troponin 03/05/2016  . Pressure injury of skin, stage 2 11/27/2015  . H/O unilateral nephrectomy 07/06/2015  . Paroxysmal SVT (supraventricular tachycardia) ([place]) 06/22/2014  . Complications due to renal dialysis device, implant, and graft 05/11/2014  . Acute blood loss anemia 03/31/2014  . Renal dialysis device, implant, or graft complication 51/88/4166  . Acute hyperkalemia   . S/P liver transplant ([place])   . Chronic hepatitis C without hepatic coma ([place])   . Pulmonary edema 01/03/2014  . Respiratory failure ([place]) 01/03/2014  . Long term current use of anticoagulant therapy 04/22/2013  . Chronic anticoagulation w/ coumadin, goal INR 2.5-3.0 w/ AVR 04/14/2013  . Tobacco abuse 02/28/2013  . COPD (chronic obstructive pulmonary disease) ([place]) 02/21/2013  . Chronic combined systolic and diastolic congestive heart failure ([place]) 02/06/2013  . Acute respiratory failure ([place]) 11/02/2012  . Acute pulmonary edema ([place]) 11/02/2012  . Acute on chronic systolic CHF (congestive heart failure) ([place]) 11/02/2012  . Dyslipidemia-LDL 104, not on statin with Hx of liver transplant 07/30/2012  . CAD (coronary artery disease), native coronary artery 07/27/2012  . History of prosthetic aortic valve replacement   . End stage renal disease on dialysis ([place]) 02/27/2011  . Hypothyroidism 06/25/2006    . Type 2 diabetes mellitus with chronic kidney disease ([place]) 06/25/2006  . GERD 06/25/2006  . Anemia due to chronic renal failure    . Hypertensive heart disease     Past Surgical History:  Procedure Laterality Date  . AMPUTATION Left 04/24/2016   Procedure: AMPUTATION BELOW KNEE;  Surgeon: Angelia Mould, MD;  Location: [facility];  Service: Vascular;  Laterality: Left;  . AORTIC VALVE REPLACEMENT N/A 03/15/2013   AVR; Surgeon: Ivin Poot, MD;  Location: [facility] OR; Open Heart Surgery;  2mm[employer] mechanical prosthesis, top hat valve  . ARTERIOVENOUS GRAFT PLACEMENT Left 10/03/10    forearm  . AV FISTULA PLACEMENT  01/29/2011   Procedure: INSERTION OF ARTERIOVENOUS (AV) GORE-TEX GRAFT ARM;  Surgeon: Elam Dutch, MD;  Location:  [facility] OR;  Service: Vascular;  Laterality: Right;  . AV FISTULA PLACEMENT  03/10/2011   Procedure: INSERTION OF ARTERIOVENOUS (AV) GORE-TEX GRAFT THIGH;  Surgeon: Elam Dutch, MD;  Location: [facility];  Service: Vascular;  Laterality: Left;  . [place] REMOVAL  12/23/2010   Procedure: REMOVAL OF ARTERIOVENOUS GORETEX GRAFT ([place]);  Surgeon: Elam Dutch, MD;  Location: [place];  Service: Vascular;  Laterality: Left;  procedure started @1736 -1852  . CHOLECYSTECTOMY  1993  . CORONARY ARTERY BYPASS GRAFT N/A 03/15/2013   Procedure: CORONARY ARTERY BYPASS GRAFTING (CABG) times one using left internal mammary artery.;  Surgeon: Ivin Poot, MD;  Location: [place];  Service: Open Heart Surgery;  Laterality: N/A;  POSS CABG X 1  . CYSTOSCOPY  1990's  . INSERTION OF DIALYSIS CATHETER  12/23/2010   Procedure: INSERTION OF DIALYSIS CATHETER;  Surgeon: Elam Dutch, MD;  Location: [facility];  Service: Vascular;  Laterality: Right;  Right Internal Jugular 28cm dialysis catheter insertion procedure time 1701-1720   . INSERTION OF DIALYSIS CATHETER Right 03/11/2016   Procedure: INSERTION OF DIALYSIS CATHETER;  Surgeon: Angelia Mould, MD;  Location: [facility];  Service:  Vascular;  Laterality: Right;  . INTRAOPERATIVE TRANSESOPHAGEAL ECHOCARDIOGRAM N/A 03/15/2013   Procedure: INTRAOPERATIVE TRANSESOPHAGEAL ECHOCARDIOGRAM;  Surgeon: Ivin Poot, MD;  Location: [place];  Service: Open Heart Surgery;  Laterality: N/A;  . LEFT HEART CATHETERIZATION WITH CORONARY ANGIOGRAM N/A 07/29/2012   Procedure: LEFT HEART CATHETERIZATION WITH CORONARY ANGIOGRAM;  Surgeon: [place] Sine, MD;  Location: [facility] CATH LAB;  Service: Cardiovascular;  Laterality: N/A;  . LEFT HEART CATHETERIZATION WITH CORONARY ANGIOGRAM N/A 03/10/2013   Procedure: LEFT HEART CATHETERIZATION WITH CORONARY ANGIOGRAM;  Surgeon: [place] Sine, MD;  Location: [facility] CATH LAB;  Service: Cardiovascular;  Laterality: N/A;  . LEFT HEART CATHETERIZATION WITH CORONARY/GRAFT ANGIOGRAM N/A 12/24/2011   Procedure: LEFT HEART CATHETERIZATION WITH Beatrix Fetters;  Surgeon: [name], MD;  Location: [facility] CATH LAB;  Service: Cardiovascular;  Laterality: N/A;  . LEFT HEART CATHETERIZATION WITH CORONARY/GRAFT ANGIOGRAM N/A 12/16/2013   Procedure: LEFT HEART CATHETERIZATION WITH Beatrix Fetters;  Surgeon: [place] Sine, MD;  Location: [facility] CATH LAB;  Service: Cardiovascular;  Laterality: N/A;  . LIGATION ARTERIOVENOUS GORTEX GRAFT Left 03/11/2016   Procedure: LIGATION THIGH ARTERIOVENOUS GORTEX GRAFT;  Surgeon: Angelia Mould, MD;  Location: [place] [place];  Service: Vascular;  Laterality: Left;  . LIVER TRANSPLANT  10/25/2009   sees Dr Ferol Luz 1 every 6 months, saw last in Dec 2013. [name] Coord [phone]  . PERIPHERAL VASCULAR CATHETERIZATION N/A 11/06/2015   Procedure: Abdominal Aortogram;  Surgeon: Serafina Mitchell, MD;  Location: [place] CV LAB;  Service: Cardiovascular;  Laterality: N/A;  . PERIPHERAL VASCULAR CATHETERIZATION N/A 11/06/2015   Procedure: Lower Extremity Angiography;  Surgeon: Serafina Mitchell, MD;  Location: [place] CV LAB;  Service: Cardiovascular;  Laterality: N/A;  .  PERIPHERAL VASCULAR CATHETERIZATION  11/06/2015   Procedure: Peripheral Vascular Atherectomy;  Surgeon: Serafina Mitchell, MD;  Location: [place] CV LAB;  Service: Cardiovascular;;  Left Superficial femoral  . SHUNTOGRAM Left 05/15/2014   Procedure: SHUNTOGRAM;  Surgeon: [place] Watkins, MD;  Location: [facility] CATH LAB;  Service: Cardiovascular;  Laterality: Left;  . SMALL INTESTINE SURGERY  90's  . SPINAL GROWTH RODS  2010   "put 2 metal rods in my back; they had detetriorated" (08/30/2012)  . THROMBECTOMY    . THROMBECTOMY AND REVISION OF ARTERIOVENTOUS (AV) GORETEX  GRAFT Left 03/30/2014  . THROMBECTOMY AND REVISION OF ARTERIOVENTOUS (AV) GORETEX  GRAFT Left 03/30/2014   Procedure: THROMBECTOMY AND REVISION OF ARTERIOVENTOUS (AV) GORETEX  GRAFT;  Surgeon: [place] Makanda, MD;  Location: [place];  Service: Vascular;  Laterality: Left;  . TUBAL LIGATION  1990's    OB History    No data available       Home Medications    Prior to Admission medications   Medication Sig Start Date End Date Taking? Authorizing Provider  allopurinol (ZYLOPRIM) 100 MG tablet Take 100 mg by mouth daily. 04/16/16   [provider]  aspirin EC 81 MG tablet Take 81 mg by mouth daily.    [provider]  calcitRIOL (ROCALTROL) 0.5 MCG capsule Take 1 capsule (0.5 mcg total) by mouth every Monday, Wednesday, and Friday with hemodialysis. 04/25/16   Reyne Dumas, MD  feeding supplement (BOOST / RESOURCE BREEZE) LIQD Take 1 Container by mouth 3 (three) times daily between meals. 04/25/16   Reyne Dumas, MD  HYDROmorphone (DILAUDID) 2 MG tablet Take 1 tablet (2 mg total) by mouth at bedtime as needed for severe pain. 05/20/16   Love, Ivan Anchors, PA-C  levothyroxine (SYNTHROID, LEVOTHROID) 175 MCG tablet TAKE 1 TABLET (175 MCG TOTAL) BY MOUTH DAILY BEFORE BREAKFAST. 04/01/16   [provider]  methocarbamol (ROBAXIN) 500 MG tablet Take 0.5 tablets (250 mg total) by mouth every 6 (six) hours as needed for muscle  spasms. 05/20/16   Love, Ivan Anchors, PA-C  metoCLOPramide (REGLAN) 5 MG tablet Take 1 tablet (5 mg total) by mouth 2 (two) times daily before a meal. Decrease to once a day before breakfast after a couple of weeks. 05/20/16   Love, Ivan Anchors, PA-C  metoprolol tartrate (LOPRESSOR) 25 MG tablet Take 1 tablet (25 mg total) by mouth 2 (two) times daily. 05/20/16   Love, Ivan Anchors, PA-C  multivitamin (RENA-VIT) TABS tablet Take 1 tablet by mouth at bedtime. 05/20/16   Love, Ivan Anchors, PA-C  nitroGLYCERIN (NITROSTAT) 0.4 MG SL tablet Place 1 tablet (0.4 mg total) under the tongue every 5 (five) minutes as needed for chest pain. 03/20/14   Elgergawy, Silver Huguenin, MD  ondansetron (ZOFRAN ODT) 4 MG disintegrating tablet Take 1 tablet (4 mg total) by mouth every 8 (eight) hours as needed for nausea. 05/20/16   Love, Ivan Anchors, PA-C  oxyCODONE (OXY IR/ROXICODONE) 5 MG immediate release tablet Take 1 tablet (5 mg total) by mouth 2 (two) times daily as needed for severe pain or breakthrough pain. 05/20/16   Love, Ivan Anchors, PA-C  pantoprazole (PROTONIX) 40 MG tablet Take 1 tablet (40 mg total) by mouth daily. 05/20/16   Love, Ivan Anchors, PA-C  polyethylene glycol (MIRALAX / GLYCOLAX) packet Take 17 g by mouth 2 (two) times daily. 05/20/16   Love, Ivan Anchors, PA-C  pregabalin (LYRICA) 75 MG capsule Take 1 capsule (75 mg total) by mouth daily. 05/21/16   Love, Ivan Anchors, PA-C  senna-docusate (SENOKOT-S) 8.6-50 MG tablet Take 1 tablet by mouth daily.    [provider]  tacrolimus (PROGRAF) 1 MG capsule Take 2 capsules (2 mg total) by mouth 2 (two) times daily. Take 2 capsules twice daily. Patient taking differently: Take 1-2 mg by mouth See admin instructions. Take 2 mg by mouth in the morning and take 1 mg by mouth at bedtime 11/29/15   Cristal Ford, DO  traMADol (ULTRAM) 50 MG tablet Take 1 tablet (50 mg total) by mouth every 6 (six)  hours as needed for moderate pain. 05/20/16   Love, Ivan Anchors, PA-C  warfarin (COUMADIN) 2.5 MG  tablet Take 1 tablet (2.5 mg total) by mouth daily at 6 PM. 05/20/16   Love, Ivan Anchors, PA-C    Family History Family History  Problem Relation Age of Onset  . Cancer Mother   . Diabetes Mother   . Hypertension Mother   . Stroke Mother   . Cancer Father   . Anesthesia problems Neg Hx   . Hypotension Neg Hx   . Malignant hyperthermia Neg Hx   . Pseudochol deficiency Neg Hx     Social History Social History  Substance Use Topics  . Smoking status: Current Every Day Smoker    Packs/day: 0.50    Years: 40.00    Types: Cigarettes  . Smokeless tobacco: Never Used  . Alcohol use No     Allergies   Acetaminophen; Morphine; Codeine; and Mirtazapine   Review of Systems Review of Systems  Constitutional: Negative for chills, diaphoresis and fever.  Gastrointestinal: Negative for nausea and vomiting.  Skin: Positive for color change and wound.  All other systems reviewed and are negative.    Physical Exam Updated Vital Signs BP (!) 121/57 (BP Location: Left Arm)   Pulse 79   Temp 97.9 F (36.6 C) (Oral)   Resp 18   SpO2 100%   Physical Exam  Constitutional: She appears well-developed and well-nourished. No distress.  HENT:  Head: Normocephalic and atraumatic.  Eyes: Conjunctivae are normal.  Neck: Neck supple.  Cardiovascular: Normal rate, regular rhythm, normal heart sounds and intact distal pulses.   Pulmonary/Chest: Effort normal and breath sounds normal. No respiratory distress.  Abdominal: Soft. There is no tenderness. There is no guarding.  Musculoskeletal: She exhibits no edema.  Left BKA  Feet:  Left Foot: amputated Lymphadenopathy:    She has no cervical adenopathy.  Neurological: She is alert.  Skin: Skin is warm and dry. She is not diaphoretic.  Blackened and cold to the touch area to right great toe and right heel. Patient states that these areas are usually warm to the touch.  Purulent open, draining wound to the location of left BKA.  Foul odor  from all three areas.   Psychiatric: She has a normal mood and affect. Her behavior is normal.  Nursing note and vitals reviewed.              ED Treatments / Results  Labs (all labs ordered are listed, but only abnormal results are displayed) Labs Reviewed  COMPREHENSIVE METABOLIC PANEL - Abnormal; Notable for the following:       Result Value   Chloride 97 (*)    Glucose, Bld 127 (*)    Creatinine, Ser 3.54 (*)    Calcium 8.1 (*)    Albumin 2.0 (*)    AST 90 (*)    Alkaline Phosphatase 243 (*)    GFR calc non Af Amer 13 (*)    GFR calc Af Amer 15 (*)    All other components within normal limits  CBC WITH DIFFERENTIAL/PLATELET - Abnormal; Notable for the following:    Hemoglobin 11.7 (*)    HCT 35.1 (*)    RDW 18.3 (*)    Monocytes Absolute 1.2 (*)    Eosinophils Absolute 1.2 (*)    All other components within normal limits  PROTIME-INR - Abnormal; Notable for the following:    Prothrombin Time 28.4 (*)    All other components within  normal limits  AEROBIC CULTURE (SUPERFICIAL SPECIMEN)    EKG  EKG Interpretation None       Radiology Dg Tibia/fibula Left  Result Date: 06/30/2016 CLINICAL DATA:  Amputation wound, check for bony destruction EXAM: LEFT TIBIA AND FIBULA - 2 VIEW COMPARISON:  None. FINDINGS: Changes consistent with a below the knee amputation are seen. Mild osteopenia is noted. No bony erosion to suggest osteomyelitis is seen. IMPRESSION: No evidence of osteomyelitis. Electronically Signed   By: Inez Catalina M.D.   On: 06/30/2016 18:48   Dg Foot Complete Right  Result Date: 06/30/2016 CLINICAL DATA:  Open wound that will not heal since April. Wounds of the first digit and heel. EXAM: RIGHT FOOT COMPLETE - 3+ VIEW COMPARISON:  None. FINDINGS: The patient's heel wound is subtly visible. No soft tissue emphysema or opaque foreign body. No erosion to suggest osteomyelitis. Osteopenia and diffuse arterially calcification. IMPRESSION: No evidence of  osteomyelitis or opaque foreign body. Electronically Signed   By: Monte Fantasia M.D.   On: 06/30/2016 13:41    Procedures Procedures (including critical care time)  Medications Ordered in ED Medications  diphenhydrAMINE (BENADRYL) injection 25 mg (not administered)  piperacillin-tazobactam (ZOSYN) IVPB 3.375 g (3.375 g Intravenous New Bag/Given 06/30/16 1939)  morphine 4 MG/ML injection 4 mg (4 mg Intravenous Given 06/30/16 1901)     Initial Impression / Assessment and Plan / ED Course  I have reviewed the triage vital signs and the nursing notes.  Pertinent labs & imaging results that were available during my care of the patient were reviewed by me and considered in my medical decision making (see chart for details).  Clinical Course as of Jun 30 2016  Mon Jun 30, 2016  7062 Spoke with Dr. Myna Hidalgo, who agreed to admit the patient.  [SJ]    Clinical Course User Index [SJ] Hazle Ogburn C, PA-C    Patient presents with wound infection. Antibiotic therapy initiated. Admission for IV antibiotics and further wound care and support.    Patient states she does ok with morphine because it really only gives her itching, but otherwise does ok with it.   Findings and plan of care discussed with Quintella Reichert, MD. Dr. Ralene Bathe personally evaluated and examined this patient.   Vitals:   06/30/16 1555 06/30/16 1853 06/30/16 1900 06/30/16 1917  BP: (!) 121/57 131/65 134/60   Pulse: 79   81  Resp: 18     Temp:      TempSrc:      SpO2: 100%         Final Clinical Impressions(s) / ED Diagnoses   Final diagnoses:  Wound infection    New Prescriptions New Prescriptions   No medications on file     Layla Maw 06/30/16 2019    Quintella Reichert, MD 07/04/16 [phone]

## 2016-06-30 NOTE — Progress Notes (Signed)
[place] for warfarin Indication: mechanical valve  Allergies  Allergen Reactions  . Acetaminophen Other (See Comments)    Liver transplant recipient   . Morphine Itching and Other (See Comments)    hallucinate  . Codeine Itching  . Mirtazapine Other (See Comments)    hallucination    Patient Measurements:    Vital Signs: Temp: 97.9 F (36.6 C) (05/21 1300) Temp Source: Oral (05/21 1300) BP: 134/60 (05/21 1900) Pulse Rate: 81 (05/21 1917)  Labs:  Recent Labs  06/30/16 1845 06/30/16 1901  HGB 11.7*  --   HCT 35.1*  --   PLT 172  --   LABPROT  --  28.4*  INR  --  2.61  CREATININE 3.54*  --     CrCl cannot be calculated (Unknown ideal weight.).  Assessment: 61yo F on PTA warfarin for mechanical aortic valve. INR on admission therapeutic at 2.61. Hgb 11.7, Plt wnl, no bleeding noted  PTA regimen: 1.25 mg daily except 2.5 mg on Sundays, Tuesdays, Thursdays  Goal of Therapy:  INR 2.5-3.5   Plan:  Warfarin 1.25 mg PO x1 Daily INR Monitor CBC, s/sx of bleeding   Gwenlyn Perking, PharmD PGY1 Pharmacy Resident Rx ED [phone] 06/30/2016 9:42 PM

## 2016-06-30 NOTE — ED Notes (Signed)
Unsuccessful attempt to start line x 1. Pt began screaming and stated "take it out please, now! Just do it in my hand." Janett Billow, RN at the bedside to attempt to start line and draw labs.

## 2016-06-30 NOTE — ED Notes (Signed)
IV team at bedside 

## 2016-06-30 NOTE — ED Notes (Signed)
Pt returned from XR. Dr. Ralene Bathe at the bedside.

## 2016-06-30 NOTE — Progress Notes (Signed)
Pharmacy Antibiotic Note  [name] is a 61 y.o. female admitted on 06/30/2016 with wound infection.  Pharmacy has been consulted for Zosyn dosing. Patient is afebrile, WBC 10.1, ESRD on HD-MWF (had full session today)  Plan: Zosyn 3.375g IV q12h (4 hour infusion) F/u cultures, length of therapy   Temp (24hrs), Avg:97.9 F (36.6 C), Min:97.9 F (36.6 C), Max:97.9 F (36.6 C)   Recent Labs Lab 06/30/16 1845  WBC 10.1  CREATININE 3.54*    CrCl cannot be calculated (Unknown ideal weight.).    Allergies  Allergen Reactions  . Acetaminophen Other (See Comments)    Liver transplant recipient   . Morphine Itching and Other (See Comments)    hallucinate  . Codeine Itching  . Mirtazapine Other (See Comments)    hallucination    Antimicrobials this admission: 5/21 Zosyn>>  Microbiology results: 5/21 WCx:  Thank you for allowing pharmacy to be a part of this patient's care.    Gwenlyn Perking, PharmD PGY1 Pharmacy Resident Rx ED [phone] 06/30/2016 7:27 PM

## 2016-07-01 ENCOUNTER — Inpatient Hospital Stay (HOSPITAL_COMMUNITY): Payer: Medicare Other

## 2016-07-01 DIAGNOSIS — I5042 Chronic combined systolic (congestive) and diastolic (congestive) heart failure: Secondary | ICD-10-CM

## 2016-07-01 DIAGNOSIS — Z992 Dependence on renal dialysis: Secondary | ICD-10-CM

## 2016-07-01 DIAGNOSIS — I70261 Atherosclerosis of native arteries of extremities with gangrene, right leg: Secondary | ICD-10-CM

## 2016-07-01 DIAGNOSIS — E440 Moderate protein-calorie malnutrition: Secondary | ICD-10-CM | POA: Insufficient documentation

## 2016-07-01 DIAGNOSIS — I70209 Unspecified atherosclerosis of native arteries of extremities, unspecified extremity: Secondary | ICD-10-CM

## 2016-07-01 DIAGNOSIS — N186 End stage renal disease: Secondary | ICD-10-CM

## 2016-07-01 LAB — BASIC METABOLIC PANEL
ANION GAP: 8 (ref 5–15)
BUN: 16 mg/dL (ref 6–20)
CHLORIDE: 100 mmol/L — AB (ref 101–111)
CO2: 30 mmol/L (ref 22–32)
Calcium: 8.4 mg/dL — ABNORMAL LOW (ref 8.9–10.3)
Creatinine, Ser: 4.49 mg/dL — ABNORMAL HIGH (ref 0.44–1.00)
GFR, EST AFRICAN AMERICAN: 11 mL/min — AB (ref >60)
GFR, EST NON AFRICAN AMERICAN: 10 mL/min — AB (ref >60)
Glucose, Bld: 91 mg/dL (ref 65–99)
Potassium: 3.8 mmol/L (ref 3.5–5.1)
SODIUM: 138 mmol/L (ref 135–145)

## 2016-07-01 LAB — CBC WITH DIFFERENTIAL/PLATELET
Basophils Absolute: 0 10*3/uL (ref 0.0–0.1)
Basophils Relative: 0 %
EOS ABS: 1.3 10*3/uL — AB (ref 0.0–0.7)
Eosinophils Relative: 14 %
HCT: 33.3 % — ABNORMAL LOW (ref 36.0–46.0)
HEMOGLOBIN: 11 g/dL — AB (ref 12.0–15.0)
LYMPHS PCT: 25 %
Lymphs Abs: 2.4 10*3/uL (ref 0.7–4.0)
MCH: 28.9 pg (ref 26.0–34.0)
MCHC: 33 g/dL (ref 30.0–36.0)
MCV: 87.4 fL (ref 78.0–100.0)
MONO ABS: 1.1 10*3/uL — AB (ref 0.1–1.0)
Monocytes Relative: 12 %
NEUTROS PCT: 49 %
Neutro Abs: 4.6 10*3/uL (ref 1.7–7.7)
PLATELETS: 169 10*3/uL (ref 150–400)
RBC: 3.81 MIL/uL — AB (ref 3.87–5.11)
RDW: 18.7 % — ABNORMAL HIGH (ref 11.5–15.5)
WBC: 9.4 10*3/uL (ref 4.0–10.5)

## 2016-07-01 LAB — PROTIME-INR
INR: 2.54
INR: 2.46
Prothrombin Time: 27.8 seconds — ABNORMAL HIGH (ref 11.4–15.2)
Prothrombin Time: 27.2 seconds — ABNORMAL HIGH (ref 11.4–15.2)

## 2016-07-01 LAB — GLUCOSE, CAPILLARY
GLUCOSE-CAPILLARY: 77 mg/dL (ref 65–99)
GLUCOSE-CAPILLARY: 113 mg/dL — AB (ref 65–99)
Glucose-Capillary: 63 mg/dL — ABNORMAL LOW (ref 65–99)
Glucose-Capillary: 49 mg/dL — ABNORMAL LOW (ref 65–99)
Glucose-Capillary: 62 mg/dL — ABNORMAL LOW (ref 65–99)
Glucose-Capillary: 95 mg/dL (ref 65–99)
Glucose-Capillary: 98 mg/dL (ref 65–99)

## 2016-07-01 LAB — MRSA PCR SCREENING: MRSA BY PCR: INVALID — AB

## 2016-07-01 LAB — HIV ANTIBODY (ROUTINE TESTING W REFLEX): HIV Screen 4th Generation wRfx: NONREACTIVE

## 2016-07-01 LAB — C-REACTIVE PROTEIN: CRP: 4.9 mg/dL — AB (ref <1.0)

## 2016-07-01 LAB — PREALBUMIN: PREALBUMIN: 8.9 mg/dL — AB (ref 18–38)

## 2016-07-01 LAB — SEDIMENTATION RATE: SED RATE: 48 mm/h — AB (ref 0–22)

## 2016-07-01 MED ORDER — CALCIUM ACETATE (PHOS BINDER) 667 MG PO CAPS
667.0000 mg | ORAL_CAPSULE | Freq: Three times a day (TID) | ORAL | Status: DC
  Administered 2016-07-01 – 2016-07-10 (×19): 667 mg via ORAL
  Filled 2016-07-01 (×20): qty 1

## 2016-07-01 MED ORDER — BOOST / RESOURCE BREEZE PO LIQD
1.0000 | Freq: Two times a day (BID) | ORAL | Status: DC
  Administered 2016-07-02 – 2016-07-06 (×3): 1 via ORAL
  Administered 2016-07-06: 15:00:00 via ORAL
  Administered 2016-07-07 – 2016-07-09 (×2): 1 via ORAL

## 2016-07-01 MED ORDER — PRO-STAT SUGAR FREE PO LIQD
30.0000 mL | Freq: Two times a day (BID) | ORAL | Status: DC
  Administered 2016-07-01 – 2016-07-08 (×4): 30 mL via ORAL
  Filled 2016-07-01 (×12): qty 30

## 2016-07-01 MED ORDER — HEPARIN (PORCINE) IN NACL 100-0.45 UNIT/ML-% IJ SOLN
850.0000 [IU]/h | INTRAMUSCULAR | Status: DC
  Administered 2016-07-01: 700 [IU]/h via INTRAVENOUS
  Administered 2016-07-03 – 2016-07-04 (×2): 850 [IU]/h via INTRAVENOUS
  Filled 2016-07-01 (×3): qty 250

## 2016-07-01 NOTE — Progress Notes (Signed)
VASCULAR LAB PRELIMINARY  ARTERIAL  ABI completed:    RIGHT    LEFT    PRESSURE WAVEFORM  PRESSURE WAVEFORM  BRACHIAL 157 Triphasic BRACHIAL 151 Triphasic  DP >254 Severeky dampened monophasic DP BKA   PT 76 Monophasic PT BKA     RIGHT LEFT  ABI N/C    ABIs were not ascertained in the right dorsalis pedis artery due to non compressible artery. The ABI in the right posterior tibial artery indicate a severe reduction in arterial flow at rest. Unable to obtain a great toe pressure due to the texture of the skin.  Bascom Biel, RVS 07/01/2016, 4:16 PM

## 2016-07-01 NOTE — Progress Notes (Addendum)
Hypoglycemic Event  CBG: 49  Treatment:161ml of apple juice, pt encouraged to eat lunch.  Symptoms: none  Follow-up CBG: Time: 1353 CBG Result: 62 Follow-up CBG: Time:  1453   CBG Result: 113  Possible Reasons for Event: poor oral intake  MD notified, will re-check BS again in 30-60min.     [name]

## 2016-07-01 NOTE — Progress Notes (Addendum)
PROGRESS NOTE    [name]  IOM:[id number] DOB: 08/31/1955 DOA: 06/30/2016 PCP: [name], MD  Brief Narrative:[name] is a 61 y.o. female with medical history significant for hypertension, diabetes, coronary artery disease, chronic back pain, depression, end-stage renal disease on HD, mechanical aortic valve on warfarin, and history of liver transplantation, H/o PAD s/p L BKA in 3/18 who presents to the emergency department for evaluation of wounds on the right foot and left BKA stump   Assessment & Plan:   R leg PAD with gangrenous changes and ischemic ulcers -risk factors: DM, ongoing Tobacco use -ABI done results pending, s/p L BKA 3 months ago -VVS consult -suspect she may need another amputation -continue empiric Abx-on rocephin/flagyl, do not suspect active infection, low threshold to stop ABx  S/p Mechanical AVR -on warfarin -will hold this -start IV heparin once INR < 2.5, suspect will need procedures this admission, VVS consulted  ESRD  -s/p last HD 5/21 -Renal consulted for HD, has catheter in chest  CAD -stable, continue ASA/lopressor  Hypertension  - Continue Lopressor   Chronic pain  - Pt with stable chronic back pain  - Continue home regimen with Robaxin, oxycodone, and oral Dilaudid prn   Hx of liver transplantation  - Stable  - Continue Prograf   DM -no longer on meds -check hba1c  DVT prophylaxis: warfarin, hold now and start heparin when INR drops to below 2.5 Code Status: Partial, no intubation  Family Communication: None at bedside Disposition Plan:to be determined, may need SNF  Consultants:   VVS  Renal  Antimicrobials:  Rocephin/flagyl  Subjective: Having severe pain in R foot/leg for months  Objective: Vitals:   06/30/16 2030 06/30/16 2205 06/30/16 2221 07/01/16 0517  BP: (!) 160/91  (!) 134/49 (!) 141/53  Pulse: 85  76 70  Resp: 18  18 18   Temp:    97.8 F (36.6 C)  TempSrc:    Tympanic  SpO2: 95%  100%  98%  Weight:  52.6 kg (115 lb 14.4 oz) 52.6 kg (115 lb 14.4 oz)   Height:  5\' 4"  (1.626 m) 5\' 4"  (1.626 m)     Intake/Output Summary (Last 24 hours) at 07/01/16 1011 Last data filed at 07/01/16 0521  Gross per 24 hour  Intake                0 ml  Output                0 ml  Net                0 ml   Filed Weights   06/30/16 2205 06/30/16 2221  Weight: 52.6 kg (115 lb 14.4 oz) 52.6 kg (115 lb 14.4 oz)    Examination:  General exam: frail, chronically ill, appears much older than stated age Respiratory system: CTAB Cardiovascular system: S1 & S2 heard, RRR.metallic click Gastrointestinal system: Abdomen is nondistended, soft and nontender. Normal bowel sounds heard. Central nervous system: Alert and oriented. No focal neurological deficits. Extremities: L BKA with 2 small healing ulcers, R leg with hyperpigmentation, swelling, ulcers at heel and under great toe Skin: as above Psychiatry: Judgement and insight appear normal. Mood & affect appropriate.     Data Reviewed:   CBC:  Recent Labs Lab 06/30/16 1845 07/01/16 0806  WBC 10.1 9.4  NEUTROABS 5.4 PENDING  HGB 11.7* 11.0*  HCT 35.1* 33.3*  MCV 87.1 87.4  PLT 172 169   Basic  Metabolic Panel:  Recent Labs Lab 06/30/16 1845 07/01/16 0806  NA 135 138  K 4.0 3.8  CL 97* 100*  CO2 28 30  GLUCOSE 127* 91  BUN 11 16  CREATININE 3.54* 4.49*  CALCIUM 8.1* 8.4*   GFR: Estimated Creatinine Clearance: 10.9 mL/min (A) (by C-G formula based on SCr of 4.49 mg/dL (H)). Liver Function Tests:  Recent Labs Lab 06/30/16 1845  AST 90*  ALT 38  ALKPHOS 243*  BILITOT 1.0  PROT 6.8  ALBUMIN 2.0*   No results for input(s): LIPASE, AMYLASE in the last 168 hours. No results for input(s): AMMONIA in the last 168 hours. Coagulation Profile:  Recent Labs Lab 06/27/16 06/30/16 1901 07/01/16 0806  INR 4.4 2.61 2.54   Cardiac Enzymes: No results for input(s): CKTOTAL, CKMB, CKMBINDEX, TROPONINI in the last 168  hours. BNP (last 3 results) No results for input(s): PROBNP in the last 8760 hours. HbA1C: No results for input(s): HGBA1C in the last 72 hours. CBG:  Recent Labs Lab 06/30/16 2224 07/01/16 0753  GLUCAP 97 77   Lipid Profile: No results for input(s): CHOL, HDL, LDLCALC, TRIG, CHOLHDL, LDLDIRECT in the last 72 hours. Thyroid Function Tests: No results for input(s): TSH, T4TOTAL, FREET4, T3FREE, THYROIDAB in the last 72 hours. Anemia Panel: No results for input(s): VITAMINB12, FOLATE, FERRITIN, TIBC, IRON, RETICCTPCT in the last 72 hours. Urine analysis:    Component Value Date/Time   COLORURINE YELLOW 11/02/2012 0428   APPEARANCEUR CLOUDY (A) 11/02/2012 0428   LABSPEC 1.011 11/02/2012 0428   PHURINE 8.0 11/02/2012 0428   GLUCOSEU 250 (A) 11/02/2012 0428   HGBUR LARGE (A) 11/02/2012 0428   BILIRUBINUR NEGATIVE 11/02/2012 0428   KETONESUR NEGATIVE 11/02/2012 0428   PROTEINUR >300 (A) 11/02/2012 0428   UROBILINOGEN 0.2 11/02/2012 0428   NITRITE NEGATIVE 11/02/2012 0428   LEUKOCYTESUR NEGATIVE 11/02/2012 0428   Sepsis Labs: @LABRCNTIP (procalcitonin:4,lacticidven:4)  ) Recent Results (from the past 240 hour(s))  MRSA PCR Screening     Status: Abnormal   Collection Time: 06/30/16 10:15 PM  Result Value Ref Range Status   MRSA by PCR INVALID RESULTS, SPECIMEN SENT FOR CULTURE (A) NEGATIVE Final    Comment: Results Called toChapman Moss RN, AT [phone] 07/01/16 BY D.VANHOOK   Blood Cultures x 2 sites     Status: None (Preliminary result)   Collection Time: 06/30/16 10:39 PM  Result Value Ref Range Status   Specimen Description BLOOD LEFT HAND  Final   Special Requests   Final    BOTTLES DRAWN AEROBIC AND ANAEROBIC Blood Culture adequate volume   Culture PENDING  Incomplete   Report Status PENDING  Incomplete  Blood Cultures x 2 sites     Status: None (Preliminary result)   Collection Time: 06/30/16 10:47 PM  Result Value Ref Range Status   Specimen Description BLOOD RIGHT HAND   Final   Special Requests   Final    BOTTLES DRAWN AEROBIC AND ANAEROBIC Blood Culture adequate volume   Culture PENDING  Incomplete   Report Status PENDING  Incomplete         Radiology Studies: Dg Tibia/fibula Left  Result Date: 06/30/2016 CLINICAL DATA:  Amputation wound, check for bony destruction EXAM: LEFT TIBIA AND FIBULA - 2 VIEW COMPARISON:  None. FINDINGS: Changes consistent with a below the knee amputation are seen. Mild osteopenia is noted. No bony erosion to suggest osteomyelitis is seen. IMPRESSION: No evidence of osteomyelitis. Electronically Signed   By: Linus Mako.D.  On: 06/30/2016 18:48   Dg Foot Complete Right  Result Date: 06/30/2016 CLINICAL DATA:  Open wound that will not heal since April. Wounds of the first digit and heel. EXAM: RIGHT FOOT COMPLETE - 3+ VIEW COMPARISON:  None. FINDINGS: The patient's heel wound is subtly visible. No soft tissue emphysema or opaque foreign body. No erosion to suggest osteomyelitis. Osteopenia and diffuse arterially calcification. IMPRESSION: No evidence of osteomyelitis or opaque foreign body. Electronically Signed   By: Monte Fantasia M.D.   On: 06/30/2016 13:41        Scheduled Meds: . allopurinol  100 mg Oral Daily  . aspirin EC  81 mg Oral Daily  . [START ON 07/02/2016] calcitRIOL  0.5 mcg Oral Q M,W,F-HD  . diphenhydrAMINE  25 mg Intravenous Once  . feeding supplement  1 Container Oral TID BM  . insulin aspart  0-5 Units Subcutaneous QHS  . insulin aspart  0-9 Units Subcutaneous TID WC  . levothyroxine  175 mcg Oral QAC breakfast  . metoCLOPramide  5 mg Oral BID AC  . metoprolol tartrate  25 mg Oral BID  . multivitamin  1 tablet Oral QHS  . pantoprazole  40 mg Oral Daily  . polyethylene glycol  17 g Oral BID  . pregabalin  75 mg Oral Daily  . senna-docusate  1 tablet Oral Daily  . sodium chloride flush  3 mL Intravenous Q12H  . tacrolimus  2 mg Oral BID  . Warfarin - Pharmacist Dosing Inpatient   Does not  apply q1800   Continuous Infusions: . sodium chloride    . cefTRIAXone (ROCEPHIN)  IV 2 g (07/01/16 0745)   And  . metronidazole 500 mg (07/01/16 0759)     LOS: 1 day    Time spent: 74min    Domenic Polite, MD [facility] Pager [phone]  If 7PM-7AM, please contact night-coverage [email or web address] Password [facility] 07/01/2016, 10:11 AM

## 2016-07-01 NOTE — Consult Note (Signed)
[facility] Renal Consultation Note    Indication for Consultation:  Management of ESRD/hemodialysis; anemia, hypertension/volume and secondary hyperparathyroidism  HPI: [name] is a 61 y.o. female with ESRD on HD, HTN, DM, CAD, chronic back pain, mechanical AVF on Coumadin, h/o liver transplant,  PAD s/p L BKA 04/24/16 with Dr. Scot Dock. Admitted yesterday for further evaluation and treatment of right foot gangrene secondary to her PAD. Seen by Dr. Scot Dock and planning arteriogram. Labs stable: K 3.8 BUN/Cr 11/3.54 Glucose 91 WBC 9.4  Hgb 11.0 INR 2.54   Seen at bedside with daughter present. She reports worsening right foot pain and swelling over the last month with foul smelling drainage Right toe has turned black with heel ulcer. Also having pain and swelling L BKA. Denies fever, chills, dizziness, CP, SOB.   Dialyzes at [facility] MWF. Last HD Monday 5/21 and completed full treatment. Compliant with HD treatments.   Past Medical History:  Diagnosis Date  . Anemia    takes Folic Acid daily  . Anxiety   . Aortic stenosis   . Arthritis    "left hand, back" (08/30/2012)  . Asthma   . CAD (coronary artery disease)   . CAD (coronary artery disease) Jan. 2015   Cath: 20% LAD, 50% D1; s/p LIMA-LAD  . Chest pain 03/06/2015  . CHF (congestive heart failure) ([place]) 05/2016  . Chronic back pain   . Chronic bronchitis ([place])    "q yr w/season changes" (08/30/2012)  . Chronic constipation    takes MIralax and Colace daily  . COPD (chronic obstructive pulmonary disease) ([place])   . Depression    takes Cymbalta for "severe" depression  . End stage renal disease on dialysis [facility]) 02/27/2011   Kidneys shut down at time of liver transplant in Sept 2011 at [facility] in [place], she has been on HD ever since.  Dialyzes at [facility] HD on TTS schedule.  Had L forearm graft used 10 months then removed Dec 2012 due to suspected infection.  A right upper arm AVG was placed Dec  2012 but she developed steal symptoms acutely and it was ligated the same day.  Never had an AV fistula due to small veins.  Now has L thigh AVG put in Jan 2013, has not clotted to date.    [name] GERD (gastroesophageal reflux disease)    takes Omeprazole daily  . Headache    "at least monthly" (08/30/2012)  . Hepatitis C   . History of blood transfusion    "several" (08/30/2012)  . Hypertension    takes Metoprolol and Lisinopril daily, sees Dr Bea Graff  . Hypothyroidism    takes Synthroid daily  . Migraine    "last migraine was in 2013" (08/30/2012)  . Neuromuscular disorder ([place])    carpal tunnel in right hand  . Obesity   . Peripheral vascular disease (HCC) hands and legs  . Pneumonia    "today and several times before" (08/30/2012)  . 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 S/P unilateral BKA (below knee amputation), left ([place]) 04/30/2016  . SVT (supraventricular tachycardia) ([place]) 06/09/14  . Tobacco abuse   . Type II diabetes mellitus (HCC)    Levemir 2units daily if > 150   Past Surgical History:  Procedure Laterality Date  . AMPUTATION Left 04/24/2016  Procedure: AMPUTATION BELOW KNEE;  Surgeon: Angelia Mould, MD;  Location: [place];  Service: Vascular;  Laterality: Left;  . AORTIC VALVE REPLACEMENT N/A 03/15/2013   AVR; Surgeon: Ivin Poot, MD;  Location: [facility] OR; Open Heart Surgery;  73mm[employer] mechanical prosthesis, top hat valve  . ARTERIOVENOUS GRAFT PLACEMENT Left 10/03/10    forearm  . AV FISTULA PLACEMENT  01/29/2011   Procedure: INSERTION OF ARTERIOVENOUS (AV) GORE-TEX GRAFT ARM;  Surgeon: Elam Dutch, MD;  Location: [facility] OR;  Service: Vascular;  Laterality: Right;  . AV FISTULA PLACEMENT  03/10/2011   Procedure: INSERTION OF ARTERIOVENOUS (AV) GORE-TEX GRAFT THIGH;  Surgeon: Elam Dutch, MD;  Location: [place];  Service:  Vascular;  Laterality: Left;  . Erin REMOVAL  12/23/2010   Procedure: REMOVAL OF ARTERIOVENOUS GORETEX GRAFT ([place]);  Surgeon: Elam Dutch, MD;  Location: [place];  Service: Vascular;  Laterality: Left;  procedure started @1736 -2951  . CHOLECYSTECTOMY  1993  . CORONARY ARTERY BYPASS GRAFT N/A 03/15/2013   Procedure: CORONARY ARTERY BYPASS GRAFTING (CABG) times one using left internal mammary artery.;  Surgeon: Ivin Poot, MD;  Location: [place];  Service: Open Heart Surgery;  Laterality: N/A;  POSS CABG X 1  . CYSTOSCOPY  1990's  . INSERTION OF DIALYSIS CATHETER  12/23/2010   Procedure: INSERTION OF DIALYSIS CATHETER;  Surgeon: Elam Dutch, MD;  Location: [place];  Service: Vascular;  Laterality: Right;  Right Internal Jugular 28cm dialysis catheter insertion procedure time 1701-1720   . INSERTION OF DIALYSIS CATHETER Right 03/11/2016   Procedure: INSERTION OF DIALYSIS CATHETER;  Surgeon: Angelia Mould, MD;  Location: [place];  Service: Vascular;  Laterality: Right;  . INTRAOPERATIVE TRANSESOPHAGEAL ECHOCARDIOGRAM N/A 03/15/2013   Procedure: INTRAOPERATIVE TRANSESOPHAGEAL ECHOCARDIOGRAM;  Surgeon: Ivin Poot, MD;  Location: [place];  Service: Open Heart Surgery;  Laterality: N/A;  . LEFT HEART CATHETERIZATION WITH CORONARY ANGIOGRAM N/A 07/29/2012   Procedure: LEFT HEART CATHETERIZATION WITH CORONARY ANGIOGRAM;  Surgeon: Troy Sine, MD;  Location: [facility] CATH LAB;  Service: Cardiovascular;  Laterality: N/A;  . LEFT HEART CATHETERIZATION WITH CORONARY ANGIOGRAM N/A 03/10/2013   Procedure: LEFT HEART CATHETERIZATION WITH CORONARY ANGIOGRAM;  Surgeon: Troy Sine, MD;  Location: [facility] CATH LAB;  Service: Cardiovascular;  Laterality: N/A;  . LEFT HEART CATHETERIZATION WITH CORONARY/GRAFT ANGIOGRAM N/A 12/24/2011   Procedure: LEFT HEART CATHETERIZATION WITH Beatrix Fetters;  Surgeon: [name], MD;  Location: [facility] CATH LAB;  Service: Cardiovascular;  Laterality: N/A;  . LEFT  HEART CATHETERIZATION WITH CORONARY/GRAFT ANGIOGRAM N/A 12/16/2013   Procedure: LEFT HEART CATHETERIZATION WITH Beatrix Fetters;  Surgeon: Troy Sine, MD;  Location: [facility] CATH LAB;  Service: Cardiovascular;  Laterality: N/A;  . LIGATION ARTERIOVENOUS GORTEX GRAFT Left 03/11/2016   Procedure: LIGATION THIGH ARTERIOVENOUS GORTEX GRAFT;  Surgeon: Angelia Mould, MD;  Location: [place];  Service: Vascular;  Laterality: Left;  . LIVER TRANSPLANT  10/25/2009   sees Dr Ferol Luz 1 every 6 months, saw last in Dec 2013. [name] Coord [phone]  . PERIPHERAL VASCULAR CATHETERIZATION N/A 11/06/2015   Procedure: Abdominal Aortogram;  Surgeon: Serafina Mitchell, MD;  Location: [place] CV LAB;  Service: Cardiovascular;  Laterality: N/A;  . PERIPHERAL VASCULAR CATHETERIZATION N/A 11/06/2015   Procedure: Lower Extremity Angiography;  Surgeon: Serafina Mitchell, MD;  Location: [place] CV LAB;  Service: Cardiovascular;  Laterality: N/A;  . PERIPHERAL VASCULAR CATHETERIZATION  11/06/2015   Procedure: Peripheral Vascular Atherectomy;  Surgeon:  Serafina Mitchell, MD;  Location: [place] CV LAB;  Service: Cardiovascular;;  Left Superficial femoral  . SHUNTOGRAM Left 05/15/2014   Procedure: SHUNTOGRAM;  Surgeon: Conrad Willow Creek, MD;  Location: [facility] CATH LAB;  Service: Cardiovascular;  Laterality: Left;  . SMALL INTESTINE SURGERY  90's  . SPINAL GROWTH RODS  2010   "put 2 metal rods in my back; they had detetriorated" (08/30/2012)  . THROMBECTOMY    . THROMBECTOMY AND REVISION OF ARTERIOVENTOUS (AV) GORETEX  GRAFT Left 03/30/2014  . THROMBECTOMY AND REVISION OF ARTERIOVENTOUS (AV) GORETEX  GRAFT Left 03/30/2014   Procedure: THROMBECTOMY AND REVISION OF ARTERIOVENTOUS (AV) GORETEX  GRAFT;  Surgeon: Conrad [place], MD;  Location: [place];  Service: Vascular;  Laterality: Left;  . TUBAL LIGATION  1990's   Family History  Problem Relation Age of Onset  . Cancer Mother   . Diabetes Mother   . Hypertension  Mother   . Stroke Mother   . Cancer Father   . Anesthesia problems Neg Hx   . Hypotension Neg Hx   . Malignant hyperthermia Neg Hx   . Pseudochol deficiency Neg Hx    Social History:  reports that she has been smoking Cigarettes.  She has a 20.00 pack-year smoking history. She has never used smokeless tobacco. She reports that she does not drink alcohol or use drugs. Allergies  Allergen Reactions  . Acetaminophen Other (See Comments)    Liver transplant recipient   . Codeine Itching  . Mirtazapine Other (See Comments)    hallucination   Prior to Admission medications   Medication Sig Start Date End Date Taking? Authorizing Provider  aspirin EC 81 MG tablet Take 81 mg by mouth daily at 12 noon.    Yes [provider]  Calcium Carbonate Antacid (TUMS PO) Take 1 tablet by mouth daily with lunch.   Yes [provider]  clindamycin (CLEOCIN) 150 MG capsule Take 150 mg by mouth 3 (three) times daily. 7 day course started 06/29/16 pm 06/29/16  Yes [provider]  collagenase (SANTYL) ointment Apply 1 application topically daily.   Yes [provider]  levothyroxine (SYNTHROID, LEVOTHROID) 175 MCG tablet Take 175 mcg by mouth daily at 12 noon.   Yes [provider]  methocarbamol (ROBAXIN) 500 MG tablet Take 0.5 tablets (250 mg total) by mouth every 6 (six) hours as needed for muscle spasms. 05/20/16  Yes Love, Ivan Anchors, PA-C  metoCLOPramide (REGLAN) 5 MG tablet Take 1 tablet (5 mg total) by mouth 2 (two) times daily before a meal. Decrease to once a day before breakfast after a couple of weeks. Patient taking differently: Take 5 mg by mouth daily before lunch.  05/20/16  Yes Love, Ivan Anchors, PA-C  metoprolol tartrate (LOPRESSOR) 25 MG tablet Take 1 tablet (25 mg total) by mouth 2 (two) times daily. Patient taking differently: Take 25 mg by mouth See admin instructions. Take 1 tablet (25 mg) by mouth twice daily - 11am and 11pm 05/20/16  Yes Love, Ivan Anchors, PA-C  multivitamin (RENA-VIT) TABS tablet Take 1 tablet by mouth at bedtime. 05/20/16  Yes Love, Ivan Anchors, PA-C  nitroGLYCERIN (NITROSTAT) 0.4 MG SL tablet Place 1 tablet (0.4 mg total) under the tongue every 5 (five) minutes as needed for chest pain. 03/20/14  Yes Elgergawy, Silver Huguenin, MD  ondansetron (ZOFRAN ODT) 4 MG disintegrating tablet Take 1 tablet (4 mg total) by mouth every 8 (eight) hours as needed for nausea. 05/20/16  Yes  Love, Pamela S, PA-C  oxyCODONE (OXY IR/ROXICODONE) 5 MG immediate release tablet Take 1 tablet (5 mg total) by mouth 2 (two) times daily as needed for severe pain or breakthrough pain. Patient taking differently: Take 5 mg by mouth every 8 (eight) hours as needed for severe pain or breakthrough pain.  05/20/16  Yes Love, Ivan Anchors, PA-C  polyethylene glycol (MIRALAX / GLYCOLAX) packet Take 17 g by mouth 2 (two) times daily. Patient taking differently: Take 17 g by mouth 2 (two) times daily as needed (constipation). Mix in 8 oz liquid and drink 05/20/16  Yes Love, Ivan Anchors, PA-C  pregabalin (LYRICA) 75 MG capsule Take 1 capsule (75 mg total) by mouth daily. Patient taking differently: Take 75 mg by mouth 3 (three) times daily.  05/21/16  Yes Love, Ivan Anchors, PA-C  senna-docusate (SENOKOT-S) 8.6-50 MG tablet Take 1 tablet by mouth daily at 12 noon.    Yes [provider]  tacrolimus (PROGRAF) 1 MG capsule Take 2 capsules (2 mg total) by mouth 2 (two) times daily. Take 2 capsules twice daily. Patient taking differently: Take 2 mg by mouth 2 (two) times daily.  11/29/15  Yes Mikhail, Velta Addison, DO  calcitRIOL (ROCALTROL) 0.5 MCG capsule Take 1 capsule (0.5 mcg total) by mouth every Monday, Wednesday, and Friday with hemodialysis. 04/25/16   Reyne Dumas, MD  feeding supplement (BOOST / RESOURCE BREEZE) LIQD Take 1 Container by mouth 3 (three) times daily between meals. Patient not taking: Reported on 06/30/2016 04/25/16   Reyne Dumas, MD  HYDROmorphone (DILAUDID) 2 MG  tablet Take 1 tablet (2 mg total) by mouth at bedtime as needed for severe pain. Patient not taking: Reported on 06/30/2016 05/20/16   Love, Ivan Anchors, PA-C  pantoprazole (PROTONIX) 40 MG tablet Take 1 tablet (40 mg total) by mouth daily. Patient not taking: Reported on 06/30/2016 05/20/16   Love, Ivan Anchors, PA-C  traMADol (ULTRAM) 50 MG tablet Take 1 tablet (50 mg total) by mouth every 6 (six) hours as needed for moderate pain. Patient not taking: Reported on 06/30/2016 05/20/16   Love, Ivan Anchors, PA-C  warfarin (COUMADIN) 2.5 MG tablet Take 1 tablet (2.5 mg total) by mouth daily at 6 PM. Patient taking differently: Take 1.25-2.5 mg by mouth See admin instructions. Take 1 tablet (2.5 mg) by mouth on Tuesday, Thursday and Saturday at bedtime, take 1/2 tablet (1.25 mg) on Monday, Wednesday and Friday at bedtime 05/20/16   Love, Ivan Anchors, PA-C   Current Facility-Administered Medications  Medication Dose Route Frequency Provider Last Rate Last Dose  . 0.9 %  sodium chloride infusion  250 mL Intravenous PRN Opyd, Ilene Qua, MD      . acetaminophen (TYLENOL) tablet 650 mg  650 mg Oral Q6H PRN Opyd, Ilene Qua, MD       Or  . acetaminophen (TYLENOL) suppository 650 mg  650 mg Rectal Q6H PRN Opyd, Ilene Qua, MD      . aspirin EC tablet 81 mg  81 mg Oral Daily Opyd, Ilene Qua, MD   81 mg at 07/01/16 1028  . [START ON 07/02/2016] calcitRIOL (ROCALTROL) capsule 0.5 mcg  0.5 mcg Oral Q M,W,F-HD Opyd, Ilene Qua, MD      . cefTRIAXone (ROCEPHIN) 2 g in dextrose 5 % 50 mL IVPB  2 g Intravenous Q24H Opyd, Ilene Qua, MD   Stopped at 07/01/16 0815   And  . metroNIDAZOLE (FLAGYL) IVPB 500 mg  500 mg Intravenous Q8H Opyd, Ilene Qua, MD 100  mL/hr at 07/01/16 1512 500 mg at 07/01/16 1512  . feeding supplement (BOOST / RESOURCE BREEZE) liquid 1 Container  1 Container Oral TID BM Opyd, Ilene Qua, MD   1 Container at 07/01/16 1344  . HYDROmorphone (DILAUDID) tablet 2 mg  2 mg Oral Q6H PRN Opyd, Ilene Qua, MD   2 mg at 07/01/16  0615  . insulin aspart (novoLOG) injection 0-5 Units  0-5 Units Subcutaneous QHS Opyd, Timothy S, MD      . insulin aspart (novoLOG) injection 0-9 Units  0-9 Units Subcutaneous TID WC Opyd, Ilene Qua, MD      . levothyroxine (SYNTHROID, LEVOTHROID) tablet 175 mcg  175 mcg Oral QAC breakfast Opyd, Ilene Qua, MD   175 mcg at 07/01/16 0758  . methocarbamol (ROBAXIN) tablet 250 mg  250 mg Oral Q6H PRN Opyd, Ilene Qua, MD      . metoCLOPramide (REGLAN) tablet 5 mg  5 mg Oral BID AC Opyd, Ilene Qua, MD   5 mg at 07/01/16 0759  . metoprolol tartrate (LOPRESSOR) tablet 25 mg  25 mg Oral BID Opyd, Ilene Qua, MD   25 mg at 07/01/16 1028  . multivitamin (RENA-VIT) tablet 1 tablet  1 tablet Oral QHS Opyd, Ilene Qua, MD      . ondansetron (ZOFRAN) tablet 4 mg  4 mg Oral Q6H PRN Opyd, Ilene Qua, MD   4 mg at 07/01/16 6269   Or  . ondansetron (ZOFRAN) injection 4 mg  4 mg Intravenous Q6H PRN Opyd, Ilene Qua, MD      . oxyCODONE (Oxy IR/ROXICODONE) immediate release tablet 5 mg  5 mg Oral Q6H PRN Opyd, Ilene Qua, MD   5 mg at 07/01/16 1028  . polyethylene glycol (MIRALAX / GLYCOLAX) packet 17 g  17 g Oral BID Opyd, Ilene Qua, MD   17 g at 07/01/16 1028  . pregabalin (LYRICA) capsule 75 mg  75 mg Oral Daily Opyd, Ilene Qua, MD   75 mg at 07/01/16 1028  . senna-docusate (Senokot-S) tablet 1 tablet  1 tablet Oral Daily Opyd, Ilene Qua, MD   1 tablet at 07/01/16 1028  . sodium chloride flush (NS) 0.9 % injection 3 mL  3 mL Intravenous Q12H Opyd, Ilene Qua, MD   3 mL at 07/01/16 1037  . sodium chloride flush (NS) 0.9 % injection 3 mL  3 mL Intravenous PRN Opyd, Ilene Qua, MD      . tacrolimus (PROGRAF) capsule 2 mg  2 mg Oral BID Opyd, Ilene Qua, MD   2 mg at 07/01/16 1035    ROS: As per HPI otherwise negative.  Physical Exam: Vitals:   06/30/16 2205 06/30/16 2221 07/01/16 0517 07/01/16 1034  BP:  (!) 134/49 (!) 141/53 (!) 138/43  Pulse:  76 70 60  Resp:  18 18 16   Temp:   97.8 F (36.6 C) 98.2 F (36.8 C)   TempSrc:   Tympanic Oral  SpO2:  100% 98% 98%  Weight: 52.6 kg (115 lb 14.4 oz) 52.6 kg (115 lb 14.4 oz)    Height: 5\' 4"  (1.626 m) 5\' 4"  (1.626 m)       General:  Elderly female,chroniclly ill appearing NAD Head: NCAT sclera not icteric MMM Neck: Supple. No JVD No masses Lungs: CTA bilaterally without wheezes, rales, or rhonchi. Breathing is unlabored. Heart: RRR with mechanical click  Abdomen: soft NT + BS Lower extremities: L BKA with trace edema/ R foot with black great toe with ulceration/ heel ulceration tender  to palpation  Neuro: A & O  X 3. Moves all extremities spontaneously. Psych:  Responds to questions appropriately with a normal affect. Dialysis Access: R IJ TDC dsg intact    Recent Labs Lab 06/30/16 1845 07/01/16 0806  NA 135 138  K 4.0 3.8  CL 97* 100*  CO2 28 30  GLUCOSE 127* 91  BUN 11 16  CREATININE 3.54* 4.49*  CALCIUM 8.1* 8.4*     Recent Labs Lab 06/30/16 1845  AST 90*  ALT 38  ALKPHOS 243*  BILITOT 1.0  PROT 6.8  ALBUMIN 2.0*    Recent Labs Lab 06/30/16 1845 07/01/16 0806  WBC 10.1 9.4  NEUTROABS 5.4 4.6  HGB 11.7* 11.0*  HCT 35.1* 33.3*  MCV 87.1 87.4  PLT 172 169    Recent Labs Lab 07/01/16 0753 07/01/16 1217 07/01/16 1336 07/01/16 1356 07/01/16 1453  GLUCAP 77 63* 49* 62* 113*   Studies/Results: Dg Tibia/fibula Left  Result Date: 06/30/2016 CLINICAL DATA:  Amputation wound, check for bony destruction EXAM: LEFT TIBIA AND FIBULA - 2 VIEW COMPARISON:  None. FINDINGS: Changes consistent with a below the knee amputation are seen. Mild osteopenia is noted. No bony erosion to suggest osteomyelitis is seen. IMPRESSION: No evidence of osteomyelitis. Electronically Signed   By: Inez Catalina M.D.   On: 06/30/2016 18:48   Dg Foot Complete Right  Result Date: 06/30/2016 CLINICAL DATA:  Open wound that will not heal since April. Wounds of the first digit and heel. EXAM: RIGHT FOOT COMPLETE - 3+ VIEW COMPARISON:  None. FINDINGS: The  patient's heel wound is subtly visible. No soft tissue emphysema or opaque foreign body. No erosion to suggest osteomyelitis. Osteopenia and diffuse arterially calcification. IMPRESSION: No evidence of osteomyelitis or opaque foreign body. Electronically Signed   By: Monte Fantasia M.D.   On: 06/30/2016 13:41   Dialysis Orders:  [facility] MWF 3.5h 180F BFR 450/A1.5x 2K/2Ca Na Linear EDW 50 kg No Heparin -Calcitriol 0.5 mcg PO q HD -Mircera 50 mcg IV q 4 weeks     Assessment/Plan:  1.  PAD/R foot gangrene - empiric abx per primary/VVS following planning arteriogram Friday  2.  ESRD -  K 3.8 4K bath Usually MWF - HD tomorrow on schedule then off schedule next HD -  VVS requesting to reschedule HD Friday in anticipation of arteriogram  3.  Hypertension/volume  - BP controlled/volume stable  4.  Anemia  - Hgb 11.0 No need for  5.  Metabolic bone disease -  Cont VDRA/Ca acetate binder  6.  Nutrition - Albumin 2.0 renal diet/vitamins Add prostat  7. S/p mechanical AVR - holding Coumadin for procedure 8. H/o liver transplant - on Prograf 9. DM - per primary   [name] PA-C [place] Pager [phone] 07/01/2016, 3:29 PM   I have seen and examined this patient and agree with plan and assessment in the above note with renal recommendations/intervention highlighted. For arteriogram on Friday. Holding coumadin. HD tomorrow, then again on Thursday, to facilitate agram on Friday.   Rylei Masella B,MD 07/01/2016 6:05 PM

## 2016-07-01 NOTE — Progress Notes (Signed)
Patient's warfarin to be held in anticipation of needing procedures during admission.  INR this morning 2.54. Patient did receive warfarin 1.25mg  last evening.   Plan: -INR at 1800 tonight -heparin when INR <2.5- consult for heparin per pharmacy placed with clear instructions  [name], PharmD, BCPS Clinical Pharmacist Pager: [phone] Clinical Phone for 07/01/2016 until 3:30pm: [phone] If after 3:30pm, please call main pharmacy at [phone] 07/01/2016 11:04 AM

## 2016-07-01 NOTE — Progress Notes (Addendum)
Inpatient Diabetes Program Recommendations  AACE/ADA: New Consensus Statement on Inpatient Glycemic Control (2015)  Target Ranges:  Prepandial:   less than 140 mg/dL      Peak postprandial:   less than 180 mg/dL (1-2 hours)      Critically ill patients:  140 - 180 mg/dL   Results for [name], [name] (MRN [id number]) as of 07/01/2016 11:46  Ref. Range 06/30/2016 22:24 07/01/2016 07:53  Glucose-Capillary Latest Ref Range: 65 - 99 mg/dL 97 77   Review of Glycemic Control  Diabetes history: DM2 Outpatient Diabetes medications: none Current orders for Inpatient glycemic control: Novolog 0-9 units TID with meals, Novolog 0-5 units QHS  Inpatient Diabetes Program Recommendations: HgbA1C: A1C 5.6% on 05/27/16 indicating an average glucose of 114 mg/dl.  Note: Noted consult for Diabetes Coordinator per Foot Ulcer protocol. Per chart review, noted patient has a history of DM2 and does not take any DM medications as an outpatient. Agree with current orders. Will continue to follow.  Thanks, Barnie Alderman, RN, MSN, CDE Diabetes Coordinator Inpatient Diabetes Program [phone] (Team Pager from 8am to 5pm)

## 2016-07-01 NOTE — Progress Notes (Signed)
ANTICOAGULATION CONSULT NOTE - Initial Consult  Pharmacy Consult for heparin when INR <2.5 Indication: mechanical AVR   Allergies  Allergen Reactions  . Acetaminophen Other (See Comments)    Liver transplant recipient   . Codeine Itching  . Mirtazapine Other (See Comments)    hallucination    Patient Measurements: Height: 5\' 4"  (162.6 cm) Weight: 115 lb 14.4 oz (52.6 kg) IBW/kg (Calculated) : 54.7 Heparin Dosing Weight: 52.6 kg  Vital Signs: Temp: 97.5 F (36.4 C) (05/22 1729) Temp Source: Oral (05/22 1729) BP: 155/71 (05/22 1729) Pulse Rate: 66 (05/22 1729)  Labs:  Recent Labs  06/30/16 1845 06/30/16 1901 07/01/16 0806 07/01/16 1826  HGB 11.7*  --  11.0*  --   HCT 35.1*  --  33.3*  --   PLT 172  --  169  --   LABPROT  --  28.4* 27.8* 27.2*  INR  --  2.61 2.54 2.46  CREATININE 3.54*  --  4.49*  --     Estimated Creatinine Clearance: 10.9 mL/min (A) (by C-G formula based on SCr of 4.49 mg/dL (H)).   Medical History: Past Medical History:  Diagnosis Date  . Anemia    takes Folic Acid daily  . Anxiety   . Aortic stenosis   . Arthritis    "left hand, back" (08/30/2012)  . Asthma   . CAD (coronary artery disease)   . CAD (coronary artery disease) Jan. 2015   Cath: 20% LAD, 50% D1; s/p LIMA-LAD  . Chest pain 03/06/2015  . CHF (congestive heart failure) ([place]) 05/2016  . Chronic back pain   . Chronic bronchitis ([place])    "q yr w/season changes" (08/30/2012)  . Chronic constipation    takes MIralax and Colace daily  . COPD (chronic obstructive pulmonary disease) ([place])   . Depression    takes Cymbalta for "severe" depression  . End stage renal disease on dialysis [facility]) 02/27/2011   Kidneys shut down at time of liver transplant in Sept 2011 at [facility] in [place], she has been on HD ever since.  Dialyzes at [facility] HD on TTS schedule.  Had L forearm graft used 10 months then removed Dec 2012 due to suspected infection.  A right upper arm AVG was placed Dec 2012  but she developed steal symptoms acutely and it was ligated the same day.  Never had an AV fistula due to small veins.  Now has L thigh AVG put in Jan 2013, has not clotted to date.    [name] GERD (gastroesophageal reflux disease)    takes Omeprazole daily  . Headache    "at least monthly" (08/30/2012)  . Hepatitis C   . History of blood transfusion    "several" (08/30/2012)  . Hypertension    takes Metoprolol and Lisinopril daily, sees Dr Bea Graff  . Hypothyroidism    takes Synthroid daily  . Migraine    "last migraine was in 2013" (08/30/2012)  . Neuromuscular disorder ([place])    carpal tunnel in right hand  . Obesity   . Peripheral vascular disease (HCC) hands and legs  . Pneumonia    "today and several times before" (08/30/2012)  . 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 S/P unilateral BKA (below knee amputation), left ([place]) 04/30/2016  . SVT (supraventricular tachycardia) ([place]) 06/09/14  . Tobacco  abuse   . Type II diabetes mellitus ([place])    Levemir 2units daily if > 150    Medications:  Scheduled:  . aspirin EC  81 mg Oral Daily  . [START ON 07/02/2016] calcitRIOL  0.5 mcg Oral Q M,W,F-HD  . calcium acetate  667 mg Oral TID WC  . [START ON 07/02/2016] feeding supplement  1 Container Oral BID BM  . feeding supplement (PRO-STAT SUGAR FREE 64)  30 mL Oral BID  . insulin aspart  0-5 Units Subcutaneous QHS  . insulin aspart  0-9 Units Subcutaneous TID WC  . levothyroxine  175 mcg Oral QAC breakfast  . metoCLOPramide  5 mg Oral BID AC  . metoprolol tartrate  25 mg Oral BID  . multivitamin  1 tablet Oral QHS  . polyethylene glycol  17 g Oral BID  . pregabalin  75 mg Oral Daily  . senna-docusate  1 tablet Oral Daily  . sodium chloride flush  3 mL Intravenous Q12H  . tacrolimus  2 mg Oral BID   Infusions:  . sodium chloride    . cefTRIAXone (ROCEPHIN)   IV Stopped (07/01/16 0815)   And  . metronidazole Stopped (07/01/16 1612)    Assessment: 61 yo female with mechanical AVR will be started on heparin.  INR is now 2.46.  Patient has renal dysfunction.  Goal of Therapy:  Heparin level 0.3-0.7 units/ml Monitor platelets by anticoagulation protocol: Yes   Plan:  - Start heparin at 700 units/hr. No bolus - 8hr heparin level - daily heparin level and CBC  Kanye Depree, Tsz-Yin 07/01/2016,7:04 PM

## 2016-07-01 NOTE — Consult Note (Signed)
Patient name: [name] MRN: [id number] DOB: 1955-03-22 Sex: female   REASON FOR CONSULT:    Gangrene of the right foot. Consult is from Dr. Broadus John.  HPI:   [name] is a 61 y.o. female, who is well known to me. She had presented with a nonhealing wound on the left foot and I ligated her left thigh AV graft. I subsequently saw her in consultation on 04/22/2016 with a chronic left heel wound. She ultimately required a left below the knee amputation which was performed on 04/24/2016.  I last saw her in the office on 05/27/2016. We removed her staples from her left BKA. She was having rest pain in the right foot. I reviewed her arteriogram from September 2017. At that time, on the right side, the right common femoral artery and deep femoral artery were patent. The superficial femoral artery was patent but had a mild stenosis at the adductor canal. The popliteal artery was patent. The anterior tibial artery was the dominant runoff vessel although there were 3 tibial vessels patent down to the ankle. The peroneal and posterior tibial arteries were very small in size. I felt that it was not likely that she had a significant change in her arteriographic findings in 6 months but that if she developed progressive pain in the right foot we would need to reconsider arteriography to see if anything had changed. At the time of her arteriogram in September it was not anything that can be done with significantly impact her distal circulation.   She was admitted yesterday by Dr. Broadus John with rest pain in the right foot and also some left BKA stump pain and swelling. She describes significant rest pain in her right foot which is aggravated by elevation and relieved with dependency. She currently is nonambulatory. She also describes a wound on her right fifth toe and right heel.  She dialyzes on Monday Wednesdays and Fridays using a right IJ tunneled dialysis catheter.   Past Medical History:    Diagnosis Date  . Anemia    takes Folic Acid daily  . Anxiety   . Aortic stenosis   . Arthritis    "left hand, back" (08/30/2012)  . Asthma   . CAD (coronary artery disease)   . CAD (coronary artery disease) Jan. 2015   Cath: 20% LAD, 50% D1; s/p LIMA-LAD  . Chest pain 03/06/2015  . CHF (congestive heart failure) ([place]) 05/2016  . Chronic back pain   . Chronic bronchitis ([place])    "q yr w/season changes" (08/30/2012)  . Chronic constipation    takes MIralax and Colace daily  . COPD (chronic obstructive pulmonary disease) ([place])   . Depression    takes Cymbalta for "severe" depression  . End stage renal disease on dialysis [facility]) 02/27/2011   Kidneys shut down at time of liver transplant in Sept 2011 at [facility] in [place], she has been on HD ever since.  Dialyzes at [facility] HD on TTS schedule.  Had L forearm graft used 10 months then removed Dec 2012 due to suspected infection.  A right upper arm AVG was placed Dec 2012 but she developed steal symptoms acutely and it was ligated the same day.  Never had an AV fistula due to small veins.  Now has L thigh AVG put in Jan 2013, has not clotted to date.    [name] GERD (gastroesophageal reflux disease)    takes Omeprazole daily  . Headache    "at least monthly" (  08/30/2012)  . Hepatitis C   . History of blood transfusion    "several" (08/30/2012)  . Hypertension    takes Metoprolol and Lisinopril daily, sees Dr Bea Graff  . Hypothyroidism    takes Synthroid daily  . Migraine    "last migraine was in 2013" (08/30/2012)  . Neuromuscular disorder ([place])    carpal tunnel in right hand  . Obesity   . Peripheral vascular disease (HCC) hands and legs  . Pneumonia    "today and several times before" (08/30/2012)  . 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 S/P liver transplant (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 S/P unilateral BKA (below knee amputation), left ([place]) 04/30/2016   . SVT (supraventricular tachycardia) ([place]) 06/09/14  . Tobacco abuse   . Type II diabetes mellitus (HCC)    Levemir 2units daily if > 150    Family History  Problem Relation Age of Onset  . Cancer Mother   . Diabetes Mother   . Hypertension Mother   . Stroke Mother   . Cancer Father   . Anesthesia problems Neg Hx   . Hypotension Neg Hx   . Malignant hyperthermia Neg Hx   . Pseudochol deficiency Neg Hx     SOCIAL HISTORY: Social History   Social History  . Marital status: Divorced    Spouse name: N/A  . Number of children: N/A  . Years of education: N/A   Occupational History  . Not on file.   Social History Main Topics  . Smoking status: Current Every Day Smoker    Packs/day: 0.50    Years: 40.00    Types: Cigarettes  . Smokeless tobacco: Never Used  . Alcohol use No  . Drug use: No  . Sexual activity: No   Other Topics Concern  . Not on file   Social History Narrative  . No narrative on file    Allergies  Allergen Reactions  . Acetaminophen Other (See Comments)    Liver transplant recipient   . Codeine Itching  . Mirtazapine Other (See Comments)    hallucination    Current Facility-Administered Medications  Medication Dose Route Frequency Provider Last Rate Last Dose  . 0.9 %  sodium chloride infusion  250 mL Intravenous PRN Opyd, Ilene Qua, MD      . acetaminophen (TYLENOL) tablet 650 mg  650 mg Oral Q6H PRN Opyd, Ilene Qua, MD       Or  . acetaminophen (TYLENOL) suppository 650 mg  650 mg Rectal Q6H PRN Opyd, Ilene Qua, MD      . aspirin EC tablet 81 mg  81 mg Oral Daily Opyd, Ilene Qua, MD   81 mg at 07/01/16 1028  . [START ON 07/02/2016] calcitRIOL (ROCALTROL) capsule 0.5 mcg  0.5 mcg Oral Q M,W,F-HD Opyd, Ilene Qua, MD      . cefTRIAXone (ROCEPHIN) 2 g in dextrose 5 % 50 mL IVPB  2 g Intravenous Q24H Opyd, Ilene Qua, MD   Stopped at 07/01/16 0815   And  . metroNIDAZOLE (FLAGYL) IVPB 500 mg  500 mg Intravenous Q8H Opyd, Ilene Qua, MD   Stopped  at 07/01/16 0859  . feeding supplement (BOOST / RESOURCE BREEZE) liquid 1 Container  1 Container Oral TID BM Opyd, Ilene Qua, MD   1 Container at 07/01/16 1344  . HYDROmorphone (DILAUDID) tablet 2 mg  2 mg  Oral Q6H PRN Vianne Bulls, MD   2 mg at 07/01/16 0615  . insulin aspart (novoLOG) injection 0-5 Units  0-5 Units Subcutaneous QHS Opyd, Timothy S, MD      . insulin aspart (novoLOG) injection 0-9 Units  0-9 Units Subcutaneous TID WC Opyd, Ilene Qua, MD      . levothyroxine (SYNTHROID, LEVOTHROID) tablet 175 mcg  175 mcg Oral QAC breakfast Opyd, Ilene Qua, MD   175 mcg at 07/01/16 0758  . methocarbamol (ROBAXIN) tablet 250 mg  250 mg Oral Q6H PRN Opyd, Ilene Qua, MD      . metoCLOPramide (REGLAN) tablet 5 mg  5 mg Oral BID AC Opyd, Ilene Qua, MD   5 mg at 07/01/16 0759  . metoprolol tartrate (LOPRESSOR) tablet 25 mg  25 mg Oral BID Opyd, Ilene Qua, MD   25 mg at 07/01/16 1028  . multivitamin (RENA-VIT) tablet 1 tablet  1 tablet Oral QHS Opyd, Ilene Qua, MD      . ondansetron (ZOFRAN) tablet 4 mg  4 mg Oral Q6H PRN Opyd, Ilene Qua, MD   4 mg at 07/01/16 6010   Or  . ondansetron (ZOFRAN) injection 4 mg  4 mg Intravenous Q6H PRN Opyd, Ilene Qua, MD      . oxyCODONE (Oxy IR/ROXICODONE) immediate release tablet 5 mg  5 mg Oral Q6H PRN Opyd, Ilene Qua, MD   5 mg at 07/01/16 1028  . polyethylene glycol (MIRALAX / GLYCOLAX) packet 17 g  17 g Oral BID Opyd, Ilene Qua, MD   17 g at 07/01/16 1028  . pregabalin (LYRICA) capsule 75 mg  75 mg Oral Daily Opyd, Ilene Qua, MD   75 mg at 07/01/16 1028  . senna-docusate (Senokot-S) tablet 1 tablet  1 tablet Oral Daily Opyd, Ilene Qua, MD   1 tablet at 07/01/16 1028  . sodium chloride flush (NS) 0.9 % injection 3 mL  3 mL Intravenous Q12H Opyd, Ilene Qua, MD   3 mL at 07/01/16 1037  . sodium chloride flush (NS) 0.9 % injection 3 mL  3 mL Intravenous PRN Opyd, Ilene Qua, MD      . tacrolimus (PROGRAF) capsule 2 mg  2 mg Oral BID Opyd, Ilene Qua, MD   2 mg at  07/01/16 1035    REVIEW OF SYSTEMS:  [X]  denotes positive finding, [ ]  denotes negative finding Cardiac  Comments:  Chest pain or chest pressure:    Shortness of breath upon exertion:    Short of breath when lying flat:    Irregular heart rhythm:        Vascular    Pain in calf, thigh, or hip brought on by ambulation:    Pain in feet at night that wakes you up from your sleep:  X Rest pain in the right foot.   Blood clot in your veins:    Leg swelling:         Pulmonary    Oxygen at home:    Productive cough:     Wheezing:         Neurologic    Sudden weakness in arms or legs:     Sudden numbness in arms or legs:     Sudden onset of difficulty speaking or slurred speech:    Temporary loss of vision in one eye:     Problems with dizziness:         Gastrointestinal    Blood in stool:     Vomited blood:  Genitourinary    Burning when urinating:     Blood in urine:        Psychiatric    Major depression:         Hematologic    Bleeding problems:    Problems with blood clotting too easily:        Skin    Rashes or ulcers:        Constitutional    Fever or chills:     PHYSICAL EXAM:   Vitals:   06/30/16 2205 06/30/16 2221 07/01/16 0517 07/01/16 1034  BP:  (!) 134/49 (!) 141/53 (!) 138/43  Pulse:  76 70 60  Resp:  18 18 16   Temp:   97.8 F (36.6 C) 98.2 F (36.8 C)  TempSrc:   Tympanic Oral  SpO2:  100% 98% 98%  Weight: 115 lb 14.4 oz (52.6 kg) 115 lb 14.4 oz (52.6 kg)    Height: 5\' 4"  (1.626 m) 5\' 4"  (1.626 m)      GENERAL: The patient is a well-nourished female, in no acute distress. The vital signs are documented above. CARDIAC: There is a regular rate and rhythm.  VASCULAR: I do not detect carotid bruits.  She has palpable femoral pulses bilaterally.  She has a monophasic posterior tibial signal on the right and a fairly brisk anterior tibial and dorsalis pedis signal on the right with the Doppler.  She has no significant right lower  extremity swelling.  PULMONARY: There is good air exchange bilaterally without wheezing or rales. ABDOMEN: Soft and non-tender with normal pitched bowel sounds.  MUSCULOSKELETAL: She has a left below the knee amputation.  NEUROLOGIC: No focal weakness or paresthesias are detected. SKIN: She has dry gangrene of her right fifth toe and also a gangrenous dry wound on her heel.  PSYCHIATRIC: The patient has a normal affect.  DATA:    Potassium is 3.8.  WBC is 9.4. Hemoglobin 11.0. Her INR is 2.54.  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 6 months ago her arteriogram did not show any real options for revascularization that would significantly impact her distal circulation. I have recommended that we proceed with arteriography to determine if anything has changed and we will try to schedule this later this week. She dialyzes on Monday Wednesdays and Fridays and most likely we will try to do this Friday. I will make further recommendations pending the results of her arteriogram. I will also obtain a vein map on the right. This is clearly a limb threatening situation given that she has severe rest pain, gangrene of the foot with infrainguinal arterial occlusive disease, and end-stage renal disease.   I do not think she is a great candidate for bypass given her multiple Medical comorbidities including a history of coronary artery disease, end-stage renal disease, aortic stenosis, and a history of congestive heart failure. If she has any options from an endovascular approach for revascularization on the right this would be ideal. I have explained to her that clearly this is a limb threatening situation. In addition not had multiple discussions with her about the importance of tobacco cessation.   We will have to hold her Coumadin in anticipation of arteriography on Friday.    RENAL: Given that she will have her arteriogram on Friday and we please try to arrange for dialysis on Thursday and Saturday instead of Friday. Thank you.  [name] [facility] [phone]

## 2016-07-01 NOTE — Consult Note (Signed)
[place] Nurse wound consult note Reason for Consult: LE ulcer right foot, history of amputation on the left.  Patient with history of BKA per Dr. Oneida Alar a few months ago, wound has not healed fully Patient continues to smoke, counseled by Dr. Broadus John Patient reports new onset of limb pain in the right foot Wound type: Arterial ulceration: right great toe Mixed etiology arterial/pressure: right heel Non healing surgical site left BKA Pressure Injury POA: Yes Measurement: Right heel: 3cm x 3cm x0.1cm; 90% black eschar, not fluctuant, 10% pink at wound edges; no drainage  Right great toe: 3cm x 2cm x 0cm: 100% eschar, intact, no drainage Left stump site: 1.0cm x 3.5cm x 0.3; 50% scabbed/50%yellow; minimal drainage, no odor   Periwound: intact, edema noted right foot, pulse not palpable, foot cool but not cold. Dressing procedure/placement/frequency:  ABIs pending Betadine to the right foot wounds to keep dry and stable until decision on need for surgical intervention Hydrogel to the stump wound, to add moisture for wound healing.  Discussed POC with patient and bedside nurse.  Re consult if needed, will not follow at this time. Thanks  Drae Mitzel [employer], RN,CWOCN, CNS [phone])

## 2016-07-01 NOTE — Progress Notes (Signed)
Initial Nutrition Assessment  DOCUMENTATION CODES:   Non-severe (moderate) malnutrition in context of chronic illness  INTERVENTION:   -Boost Breeze po BID, each supplement provides 250 kcal and 9 grams of protein -30 ml Prostat BID, each supplement provides 100 kcals and 15 grams protein  NUTRITION DIAGNOSIS:   Malnutrition (mild) related to chronic illness (PAD) as evidenced by energy intake < 75% for > or equal to 1 month, mild depletion of body fat, moderate depletion of body fat, mild depletion of muscle mass, moderate depletions of muscle mass.  GOAL:   Patient will meet greater than or equal to 90% of their needs  MONITOR:   PO intake, Supplement acceptance, Weight trends, Labs, Skin, I & O's  REASON FOR ASSESSMENT:   Consult Wound healing  ASSESSMENT:   [name] is a 61 y.o. female with medical history significant for hypertension, diabetes, coronary artery disease, chronic back pain, depression, end-stage renal disease on HD, mechanical aortic valve on warfarin, and history of liver transplantation, H/o PAD s/p L BKA in 3/18 who presents to the emergency department for evaluation of wounds on the right foot and left BKA stump  Pt admitted with rt DM foot ulcer.   Spoke with pt, who had a flat affect. She reports poor appetite over the past 3 weeks. She generally consumes 2 meals per day, which consists of foods such as salads. Noted pt consumed less than 25% of chef salad meal. She shares she tries to consume one Boost supplement daily.   Pt endorses weight loss. She estimates she has lost at least 6# over the past two weeks. Reviewed wt hx; noted pt has experienced a 12.8% wt loss over the past 3 months, which is significant for time frame. However, due to pt with lt BKA done on 04/24/16, suspect some wt loss is related to amputation.   Nutrition-Focused physical exam completed. Findings are mild to moderate fat depletion, mild to moderate muscle depletion, and no  edema.   Pt amenable to continue supplements. Discussed importance of good meal and supplement intake to promote healing, especially in the presence of wounds.   Labs reviewed: CBGS: 49-113.   Diet Order:  Diet regular Room service appropriate? Yes; Fluid consistency: Thin  Skin:  Wound (see comment) (nonhealing wound lt BKA stump, st II coccyx, UN rt heel, rt )  Last BM:  06/28/16  Height:   Ht Readings from Last 1 Encounters:  06/30/16 5\' 4"  (1.626 m)    Weight:   Wt Readings from Last 1 Encounters:  06/30/16 115 lb 14.4 oz (52.6 kg)    Ideal Body Weight:  51 kg (adjusted for lt BKA)  BMI:  Body mass index is 19.89 kg/m.  Estimated Nutritional Needs:   Kcal:  1600-1800  Protein:  80-95 grams  Fluid:  per MD  EDUCATION NEEDS:   Education needs addressed  Alessia Gonsalez A. Jimmye Norman, RD, LDN, CDE Pager: [phone] After hours Pager: [phone]

## 2016-07-02 LAB — PROTIME-INR
INR: 2.98
INR: 2.34
Prothrombin Time: 31.6 seconds — ABNORMAL HIGH (ref 11.4–15.2)
Prothrombin Time: 26 seconds — ABNORMAL HIGH (ref 11.4–15.2)

## 2016-07-02 LAB — HEPARIN LEVEL (UNFRACTIONATED)
Heparin Unfractionated: 0.21 IU/mL — ABNORMAL LOW (ref 0.30–0.70)
Heparin Unfractionated: 0.49 IU/mL (ref 0.30–0.70)
Heparin Unfractionated: 0.5 IU/mL (ref 0.30–0.70)

## 2016-07-02 LAB — HEMOGLOBIN A1C
HEMOGLOBIN A1C: 5.7 % — AB (ref 4.8–5.6)
Hgb A1c MFr Bld: 5.7 % — ABNORMAL HIGH (ref 4.8–5.6)
Mean Plasma Glucose: 117 mg/dL
Mean Plasma Glucose: 117 mg/dL

## 2016-07-02 LAB — RENAL FUNCTION PANEL
ALBUMIN: 1.8 g/dL — AB (ref 3.5–5.0)
Albumin: 1.7 g/dL — ABNORMAL LOW (ref 3.5–5.0)
Anion gap: 12 (ref 5–15)
Anion gap: 9 (ref 5–15)
BUN: 22 mg/dL — ABNORMAL HIGH (ref 6–20)
BUN: <5 mg/dL — ABNORMAL LOW (ref 6–20)
CALCIUM: 7.4 mg/dL — AB (ref 8.9–10.3)
CHLORIDE: 97 mmol/L — AB (ref 101–111)
CO2: 27 mmol/L (ref 22–32)
CO2: 28 mmol/L (ref 22–32)
CREATININE: 2.5 mg/dL — AB (ref 0.44–1.00)
Calcium: 8 mg/dL — ABNORMAL LOW (ref 8.9–10.3)
Chloride: 100 mmol/L — ABNORMAL LOW (ref 101–111)
Creatinine, Ser: 5.96 mg/dL — ABNORMAL HIGH (ref 0.44–1.00)
GFR calc Af Amer: 8 mL/min — ABNORMAL LOW (ref >60)
GFR calc Af Amer: 23 mL/min — ABNORMAL LOW (ref >60)
GFR calc non Af Amer: 7 mL/min — ABNORMAL LOW (ref >60)
GFR, EST NON AFRICAN AMERICAN: 20 mL/min — AB (ref >60)
GLUCOSE: 128 mg/dL — AB (ref 65–99)
Glucose, Bld: 110 mg/dL — ABNORMAL HIGH (ref 65–99)
Phosphorus: 6.5 mg/dL — ABNORMAL HIGH (ref 2.5–4.6)
Phosphorus: 2.8 mg/dL (ref 2.5–4.6)
Potassium: 3.6 mmol/L (ref 3.5–5.1)
Potassium: 3.1 mmol/L — ABNORMAL LOW (ref 3.5–5.1)
SODIUM: 134 mmol/L — AB (ref 135–145)
Sodium: 139 mmol/L (ref 135–145)

## 2016-07-02 LAB — GLUCOSE, CAPILLARY
Glucose-Capillary: 69 mg/dL (ref 65–99)
Glucose-Capillary: 81 mg/dL (ref 65–99)
Glucose-Capillary: 129 mg/dL — ABNORMAL HIGH (ref 65–99)
Glucose-Capillary: 154 mg/dL — ABNORMAL HIGH (ref 65–99)

## 2016-07-02 LAB — CBC
HCT: 31.2 % — ABNORMAL LOW (ref 36.0–46.0)
HEMATOCRIT: 31.5 % — AB (ref 36.0–46.0)
HEMOGLOBIN: 10.3 g/dL — AB (ref 12.0–15.0)
Hemoglobin: 10.4 g/dL — ABNORMAL LOW (ref 12.0–15.0)
MCH: 28.1 pg (ref 26.0–34.0)
MCH: 28.6 pg (ref 26.0–34.0)
MCHC: 32.7 g/dL (ref 30.0–36.0)
MCHC: 33.3 g/dL (ref 30.0–36.0)
MCV: 86.1 fL (ref 78.0–100.0)
MCV: 85.7 fL (ref 78.0–100.0)
Platelets: 163 10*3/uL (ref 150–400)
Platelets: 173 10*3/uL (ref 150–400)
RBC: 3.66 MIL/uL — AB (ref 3.87–5.11)
RBC: 3.64 MIL/uL — ABNORMAL LOW (ref 3.87–5.11)
RDW: 18 % — ABNORMAL HIGH (ref 11.5–15.5)
RDW: 18.2 % — ABNORMAL HIGH (ref 11.5–15.5)
WBC: 9.6 10*3/uL (ref 4.0–10.5)
WBC: 10.6 10*3/uL — ABNORMAL HIGH (ref 4.0–10.5)

## 2016-07-02 MED ORDER — ALTEPLASE 2 MG IJ SOLR: 2.0000 mg | Freq: Once | INTRAMUSCULAR | Status: DC | PRN

## 2016-07-02 MED ORDER — LIDOCAINE HCL (PF) 1 % IJ SOLN: 5.0000 mL | INTRAMUSCULAR | Status: DC | PRN

## 2016-07-02 MED ORDER — SODIUM CHLORIDE 0.9 % IV SOLN: 100.0000 mL | INTRAVENOUS | Status: DC | PRN

## 2016-07-02 MED ORDER — HEPARIN SODIUM (PORCINE) 1000 UNIT/ML DIALYSIS: 1000.0000 [IU] | INTRAMUSCULAR | Status: DC | PRN

## 2016-07-02 MED ORDER — LIDOCAINE-PRILOCAINE 2.5-2.5 % EX CREA: 1.0000 "application " | TOPICAL_CREAM | CUTANEOUS | Status: DC | PRN

## 2016-07-02 MED ORDER — CALCITRIOL 0.5 MCG PO CAPS
ORAL_CAPSULE | ORAL | Status: AC
  Administered 2016-07-02: 0.5 ug via ORAL
  Filled 2016-07-02: qty 1

## 2016-07-02 MED ORDER — PENTAFLUOROPROP-TETRAFLUOROETH EX AERO: 1.0000 "application " | INHALATION_SPRAY | CUTANEOUS | Status: DC | PRN

## 2016-07-02 NOTE — Progress Notes (Signed)
ANTICOAGULATION CONSULT NOTE - Follow Up Consult  Pharmacy Consult for heparin Indication: AVR  Labs:  Recent Labs  06/30/16 1845 06/30/16 1901 07/01/16 0806 07/01/16 1826 07/02/16 0354  HGB 11.7*  --  11.0*  --  10.3*  HCT 35.1*  --  33.3*  --  31.5*  PLT 172  --  169  --  163  LABPROT  --  28.4* 27.8* 27.2*  --   INR  --  2.61 2.54 2.46  --   HEPARINUNFRC  --   --   --   --  0.21*  CREATININE 3.54*  --  4.49*  --   --     Assessment: 60yo female subtherapeutic on heparin with initial dosing while Coumadin on hold; of note pt had higher heparin requirement (1100 units/hr) in April.  Goal of Therapy:  Heparin level 0.3-0.7 units/ml   Plan:  Will increase heparin gtt by 3 units/kg/hr to 850 units/hr and check level in [place], PharmD, BCPS  07/02/2016,5:34 AM

## 2016-07-02 NOTE — Progress Notes (Signed)
[facility] Rounding/Procedure Notes  I have personally attended this patient's dialysis session.  [place] [address]  UF goal 2L Labs all pending   Jamal Maes, MD [facility] [phone] Pager 07/02/2016, 8:57 AM  Rounding Note  Subjective/Interval History:  Still sig foot pain Afebrile on   Objective Vital signs in last 24 hours: Vitals:   07/01/16 1034 07/01/16 1729 07/01/16 2140 07/02/16 0410  BP: (!) 138/43 (!) 155/71 (!) 142/52 (!) 98/45  Pulse: 60 66 73 68  Resp: 16 16 14 16   Temp: 98.2 F (36.8 C) 97.5 F (36.4 C) 98.3 F (36.8 C) 98.3 F (36.8 C)  TempSrc: Oral Oral    SpO2: 98%   100%  Weight:   52.2 kg (115 lb)   Height:       Weight change: -0.408 kg (-14.4 oz)  Intake/Output Summary (Last 24 hours) at 07/02/16 0844 Last data filed at 07/02/16 0600  Gross per 24 hour  Intake              420 ml  Output                0 ml  Net              420 ml   Physical Exam:  Blood pressure (!) 98/45, pulse 68, temperature 98.3 F (36.8 C), resp. rate 16, height 5\' 4"  (1.626 m), weight 52.2 kg (115 lb), SpO2 100 %.  Very nice older AAF NAD Seen in HD No JVD Lungs clear Healed sternotomy scar Regular rhythm S1S2 No S3 Mech valve click. 1/6 murmur LSB Abd no focal tenderness Scars from liver transplant surgery w/extensive keloid formation R BKA with unhealed area L foot wrapped/toes dark/cool to touch, some odor   Recent Labs Lab 06/30/16 1845 07/01/16 0806  NA 135 138  K 4.0 3.8  CL 97* 100*  CO2 28 30  GLUCOSE 127* 91  BUN 11 16  CREATININE 3.54* 4.49*  CALCIUM 8.1* 8.4*    Recent Labs Lab 06/30/16 1845  AST 90*  ALT 38  ALKPHOS 243*  BILITOT 1.0  PROT 6.8  ALBUMIN 2.0*    Recent Labs Lab 06/30/16 1845 07/01/16 0806 07/02/16 0354  WBC 10.1 9.4 9.6  NEUTROABS 5.4 4.6  --   HGB 11.7* 11.0* 10.3*  HCT 35.1* 33.3* 31.5*  MCV 87.1 87.4 86.1  PLT 172 169 163    Recent Labs Lab 07/01/16 1356 07/01/16 1453 07/01/16 1610  07/01/16 2206 07/02/16 0732  GLUCAP 62* 113* 95 98 69    Studies/Results: Dg Tibia/fibula Left  Result Date: 06/30/2016 CLINICAL DATA:  Amputation wound, check for bony destruction EXAM: LEFT TIBIA AND FIBULA - 2 VIEW COMPARISON:  None. FINDINGS: Changes consistent with a below the knee amputation are seen. Mild osteopenia is noted. No bony erosion to suggest osteomyelitis is seen. IMPRESSION: No evidence of osteomyelitis. Electronically Signed   By: Inez Catalina M.D.   On: 06/30/2016 18:48   Dg Foot Complete Right  Result Date: 06/30/2016 CLINICAL DATA:  Open wound that will not heal since April. Wounds of the first digit and heel. EXAM: RIGHT FOOT COMPLETE - 3+ VIEW COMPARISON:  None. FINDINGS: The patient's heel wound is subtly visible. No soft tissue emphysema or opaque foreign body. No erosion to suggest osteomyelitis. Osteopenia and diffuse arterially calcification. IMPRESSION: No evidence of osteomyelitis or opaque foreign body. Electronically Signed   By: Monte Fantasia M.D.   On: 06/30/2016 13:41   Medications: . sodium  chloride    . cefTRIAXone (ROCEPHIN)  IV Stopped (07/01/16 0815)   And  . metronidazole Stopped (07/02/16 0322)  . heparin 850 Units/hr (07/02/16 0535)   . aspirin EC  81 mg Oral Daily  . calcitRIOL  0.5 mcg Oral Q M,W,F-HD  . calcium acetate  667 mg Oral TID WC  . feeding supplement  1 Container Oral BID BM  . feeding supplement (PRO-STAT SUGAR FREE 64)  30 mL Oral BID  . insulin aspart  0-5 Units Subcutaneous QHS  . insulin aspart  0-9 Units Subcutaneous TID WC  . levothyroxine  175 mcg Oral QAC breakfast  . metoCLOPramide  5 mg Oral BID AC  . metoprolol tartrate  25 mg Oral BID  . multivitamin  1 tablet Oral QHS  . polyethylene glycol  17 g Oral BID  . pregabalin  75 mg Oral Daily  . senna-docusate  1 tablet Oral Daily  . sodium chloride flush  3 mL Intravenous Q12H  . tacrolimus  2 mg Oral BID   Background: 61 y.o. female with ESRD on HD, HTN, DM,  CAD, chronic back pain, 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 Scot Dock. 5/22 for further evaluation and treatment of right foot gangrene secondary to her PAD. Seen by Dr. Scot Dock and planning arteriogram. We were asked to provide HD.   Dialysis Orders:  [place] MWF 3.5h 180F  BFR 450/A1.5x  2K/2Ca  Na Linear  EDW 50 kg No Heparin -Calcitriol 0.5 mcg PO q HD -Mircera 50 mcg IV q 4 weeks    Assessment/Recommendations  1.  PAD/R foot gangrene/recent L BKA (not healed) - empiric abx (Rocephn and flagyl) per primary/VVS following planning arteriogram Friday to assess vascular adequacy for healing.  2.  ESRD -  MWF [place]. We can do HD late on Friday after a'gram done so as not to interfere.   3.  Anemia  - Hgb slow decline 11.7->10.3. Will give dose of Aranesp with Friday tmt if trend continues 4.  Metabolic bone disease -  Cont VDRA/Ca acetate binder  5.  Nutrition - Albumin 2.0 renal diet/vitamins Add prostat  6. S/p mechanical AVR - holding Coumadin for procedure. Pharmacy managing anticoagulation 7. H/o liver transplant - on Prograf 8. DM - per primary    Jamal Maes, MD [facility] [phone] pager 07/02/2016, 8:44 AM

## 2016-07-02 NOTE — Progress Notes (Signed)
[facility]                                                                              Patient Demographics  [name], is a 61 y.o. female, DOB - 09/29/1955, WCH:[id number]  Admit date - 06/30/2016   Admitting Physician [name] Bulls, MD  Outpatient Primary MD for the patient is [name] Hipps, MD  Outpatient specialists:   LOS - 2  days    Chief Complaint  Patient presents with  . Wound Check       Brief summary   [name] Hovsepian [name] a 61 y.o.femalewith medical history significant for hypertension, diabetes, coronary artery disease, chronic back pain, depression, end-stage renal disease on HD, mechanical aortic valve on warfarin, and history of liver transplantation, H/o PAD s/p L BKA in 3/18 who presents to the emergency department for evaluation of wounds on the right foot and left BKA stump   Assessment & Plan   Right foot nonhealing ulcers with gangrenous changes with underlying PVD - Underlying risk factors including diabetes, ongoing tobacco abuse, ESRD - ABI right could not be ascertained due to noncompressible vessels in the dorsalis pedis artery, from posterior tibial artery indicates severe reduction in the arterial flow. Left could not be obtained due to BKA - Status post left BKA 3 months ago - Per vascular surgery repeat arteriogram on Friday 5/25, currently on IV heparin - Continue ceftriaxone, Flagyl  Active problems Pressure ulcers Right heel: 3cm x 3cm x0.1cm; no drainage  Right great toe: 3cm x 2cm x 0cm:  intact, no drainage Left stump site: 1.0cm x 3.5cm x 0.3; minimal drainage, no odor Wound care following, Vas surgery following  Mechanical AVR - Holding Coumadin for procedure - Continue IV heparin  ESRD on hemodialysis MWF - Nephrology following closely, hemodialysis per schedule  Coronary artery disease - Currently stable, no chest pain, continue aspirin, beta blocker   Diabetes mellitus with hypoglycemia - Not on  any meds, and I change to renal diet - DC sliding scale  History of liver transplantation - Continue Prograf  Hypertension - Currently stable, continue Lopressor  Code Status:Partial  DVT Prophylaxis: Heparin drip, Coumadin on hold  Family Communication: Discussed in detail with the patient, all imaging results, lab results explained to the patient   Disposition Plan   Time Spent in minutes  25 minutes  Procedures:  ABI Right - ABI could not be ascertained due to non compressible   vessel in the dorsalis pedis artery. The ABI calculated from the   posterior tibial artery indicate s a severe reduction in arterial   flow at res. - Left - ABI cannot be obtained due to below the knee amputation.  Consultants:   Nephrology Vascular surgery  Antimicrobials:   IV ceftriaxone 5/21>  IV Flagyl 5/21>   Medications  Scheduled Meds: . aspirin EC  81 mg Oral Daily  . calcitRIOL  0.5 mcg Oral Q M,W,F-HD  . calcium acetate  667 mg Oral TID WC  . feeding supplement  1 Container Oral BID BM  . feeding supplement (PRO-STAT SUGAR FREE 64)  30 mL Oral BID  .  levothyroxine  175 mcg Oral QAC breakfast  . metoCLOPramide  5 mg Oral BID AC  . metoprolol tartrate  25 mg Oral BID  . multivitamin  1 tablet Oral QHS  . polyethylene glycol  17 g Oral BID  . pregabalin  75 mg Oral Daily  . senna-docusate  1 tablet Oral Daily  . tacrolimus  2 mg Oral BID   Continuous Infusions: . cefTRIAXone (ROCEPHIN)  IV Stopped (07/02/16 1323)   And  . metronidazole Stopped (07/02/16 1356)  . heparin 850 Units/hr (07/02/16 0535)   PRN Meds:.acetaminophen **OR** acetaminophen, HYDROmorphone, methocarbamol, ondansetron **OR** ondansetron (ZOFRAN) IV, oxyCODONE   Antibiotics   Anti-infectives    Start     Dose/Rate Route Frequency Ordered Stop   07/01/16 0800  cefTRIAXone (ROCEPHIN) 2 g in dextrose 5 % 50 mL IVPB     2 g 100 mL/hr over 30 Minutes Intravenous Every 24 hours 06/30/16 2027      07/01/16 0800  metroNIDAZOLE (FLAGYL) IVPB 500 mg     500 mg 100 mL/hr over 60 Minutes Intravenous Every 8 hours 06/30/16 2027     06/30/16 2000  piperacillin-tazobactam (ZOSYN) IVPB 3.375 g  Status:  Discontinued     3.375 g 12.5 mL/hr over 240 Minutes Intravenous Every 12 hours 06/30/16 1925 06/30/16 2027        Subjective:   [name] was seen and examined todayIn hemodialysis. Just not feeling too well, pain in the right foot bothering 8/10 in intensity sharp and throbbing.  Patient denies dizziness, chest pain, shortness of breath, abdominal pain, N/V/D/C, new weakness, numbess, tingling. No acute events overnight.    Objective:   Vitals:   07/02/16 1115 07/02/16 1145 07/02/16 1215 07/02/16 1254  BP: (!) 113/56 (!) 91/50 (!) 101/45 (!) 122/45  Pulse: 78 74 74 70  Resp:  18 18 18   Temp:   97.4 F (36.3 C) 97.7 F (36.5 C)  TempSrc:   Oral Oral  SpO2:   99% 97%  Weight:      Height:        Intake/Output Summary (Last 24 hours) at 07/02/16 1436 Last data filed at 07/02/16 1215  Gross per 24 hour  Intake              180 ml  Output            -2100 ml  Net             2280 ml     Wt Readings from Last 3 Encounters:  07/02/16 53.2 kg (117 lb 4.6 oz)  05/28/16 49.1 kg (108 lb 3.9 oz)  05/20/16 54.4 kg (120 lb)     Exam  General: Alert and oriented x 3, NAD  HEENT:  PERRLA, EOMI, Anicteric Sclera, mucous membranes moist.   Neck: Supple, no JVD,   Cardiovascular: S1 S2 auscultated,Mechanical valve click   Respiratory: Clear to auscultation bilaterally, no wheezing, rales or rhonchi  Gastrointestinal: Soft, nontender, nondistended, + bowel sounds  Ext: no cyanosis clubbing. Dry gangrene on the right great toe and right heel   Neuro: AAOx3, Cr N's II- XII. Strength 5/5 upper and lower extremities bilaterally  Skin:Dry gangrene on the right great toe and right heel   Psych: Normal affect and demeanor, alert and oriented x3    Data Reviewed:  I have  personally reviewed following labs and imaging studies  Micro Results Recent Results (from the past 240 hour(s))  MRSA PCR Screening  Status: Abnormal   Collection Time: 06/30/16 10:15 PM  Result Value Ref Range Status   MRSA by PCR INVALID RESULTS, SPECIMEN SENT FOR CULTURE (A) NEGATIVE Final    Comment: Results Called toChapman Moss RN, AT [phone] 07/01/16 BY D.VANHOOK   MRSA culture     Status: None (Preliminary result)   Collection Time: 06/30/16 10:15 PM  Result Value Ref Range Status   Specimen Description NASAL SWAB  Final   Special Requests NONE  Final   Culture CULTURE REINCUBATED FOR BETTER GROWTH  Final   Report Status PENDING  Incomplete  Blood Cultures x 2 sites     Status: None (Preliminary result)   Collection Time: 06/30/16 10:39 PM  Result Value Ref Range Status   Specimen Description BLOOD LEFT HAND  Final   Special Requests   Final    BOTTLES DRAWN AEROBIC AND ANAEROBIC Blood Culture adequate volume   Culture PENDING  Incomplete   Report Status PENDING  Incomplete  Blood Cultures x 2 sites     Status: None (Preliminary result)   Collection Time: 06/30/16 10:47 PM  Result Value Ref Range Status   Specimen Description BLOOD RIGHT HAND  Final   Special Requests   Final    BOTTLES DRAWN AEROBIC AND ANAEROBIC Blood Culture adequate volume   Culture PENDING  Incomplete   Report Status PENDING  Incomplete    Radiology Reports Dg Tibia/fibula Left  Result Date: 06/30/2016 CLINICAL DATA:  Amputation wound, check for bony destruction EXAM: LEFT TIBIA AND FIBULA - 2 VIEW COMPARISON:  None. FINDINGS: Changes consistent with a below the knee amputation are seen. Mild osteopenia is noted. No bony erosion to suggest osteomyelitis is seen. IMPRESSION: No evidence of osteomyelitis. Electronically Signed   By: Inez Catalina M.D.   On: 06/30/2016 18:48   Dg Foot Complete Right  Result Date: 06/30/2016 CLINICAL DATA:  Open wound that will not heal since April. Wounds of the first  digit and heel. EXAM: RIGHT FOOT COMPLETE - 3+ VIEW COMPARISON:  None. FINDINGS: The patient's heel wound is subtly visible. No soft tissue emphysema or opaque foreign body. No erosion to suggest osteomyelitis. Osteopenia and diffuse arterially calcification. IMPRESSION: No evidence of osteomyelitis or opaque foreign body. Electronically Signed   By: Monte Fantasia M.D.   On: 06/30/2016 13:41    Lab Data:  CBC:  Recent Labs Lab 06/30/16 1845 07/01/16 0806 07/02/16 0354 07/02/16 0858  WBC 10.1 9.4 9.6 10.6*  NEUTROABS 5.4 4.6  --   --   HGB 11.7* 11.0* 10.3* 10.4*  HCT 35.1* 33.3* 31.5* 31.2*  MCV 87.1 87.4 86.1 85.7  PLT 172 169 163 623   Basic Metabolic Panel:  Recent Labs Lab 06/30/16 1845 07/01/16 0806 07/02/16 0858  NA 135 138 139  K 4.0 3.8 3.6  CL 97* 100* 100*  CO2 28 30 27   GLUCOSE 127* 91 110*  BUN 11 16 22*  CREATININE 3.54* 4.49* 5.96*  CALCIUM 8.1* 8.4* 8.0*  PHOS  --   --  6.5*   GFR: Estimated Creatinine Clearance: 8.3 mL/min (A) (by C-G formula based on SCr of 5.96 mg/dL (H)). Liver Function Tests:  Recent Labs Lab 06/30/16 1845 07/02/16 0858  AST 90*  --   ALT 38  --   ALKPHOS 243*  --   BILITOT 1.0  --   PROT 6.8  --   ALBUMIN 2.0* 1.7*   No results for input(s): LIPASE, AMYLASE in the last 168 hours. No  results for input(s): AMMONIA in the last 168 hours. Coagulation Profile:  Recent Labs Lab 06/27/16 06/30/16 1901 07/01/16 0806 07/01/16 1826 07/02/16 0354  INR 4.4 2.61 2.54 2.46 2.98   Cardiac Enzymes: No results for input(s): CKTOTAL, CKMB, CKMBINDEX, TROPONINI in the last 168 hours. BNP (last 3 results) No results for input(s): PROBNP in the last 8760 hours. HbA1C:  Recent Labs  06/30/16 2239 07/01/16 1100  HGBA1C 5.7* 5.7*   CBG:  Recent Labs Lab 07/01/16 1453 07/01/16 1610 07/01/16 2206 07/02/16 0732 07/02/16 1251  GLUCAP 113* 95 98 69 81   Lipid Profile: No results for input(s): CHOL, HDL, LDLCALC, TRIG,  CHOLHDL, LDLDIRECT in the last 72 hours. Thyroid Function Tests: No results for input(s): TSH, T4TOTAL, FREET4, T3FREE, THYROIDAB in the last 72 hours. Anemia Panel: No results for input(s): VITAMINB12, FOLATE, FERRITIN, TIBC, IRON, RETICCTPCT in the last 72 hours. Urine analysis:    Component Value Date/Time   COLORURINE YELLOW 11/02/2012 0428   APPEARANCEUR CLOUDY (A) 11/02/2012 0428   LABSPEC 1.011 11/02/2012 0428   PHURINE 8.0 11/02/2012 0428   GLUCOSEU 250 (A) 11/02/2012 0428   HGBUR LARGE (A) 11/02/2012 0428   BILIRUBINUR NEGATIVE 11/02/2012 0428   KETONESUR NEGATIVE 11/02/2012 0428   PROTEINUR >300 (A) 11/02/2012 0428   UROBILINOGEN 0.2 11/02/2012 0428   NITRITE NEGATIVE 11/02/2012 0428   LEUKOCYTESUR NEGATIVE 11/02/2012 0428     Lilliauna Van M.D. [facility] 07/02/2016, 2:36 PM  Pager: [phone] Between 7am to 7pm - call Pager - [phone]  After 7pm go to [email or web address] - password [email or web address]  Call night coverage person covering after 7pm

## 2016-07-02 NOTE — Progress Notes (Signed)
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 Her only finding 6 months ago was a mild stenosis in the superficial femoral artery. I have recommended a repeat arteriogram to see what has changed. Hopefully she is a candidate for endovascular approach to revascularization. Think she would be a poor candidate for a tibial bypass given the small size of her arteries and multiple medical comorbidities including coronary artery disease, congestive heart failure, and end-stage renal disease.   COUMADIN: Currently on heparin IV as Coumadin is being held in anticipation of arteriogram on Friday.   END-STAGE RENAL DISEASE: Nephrology, please arrange for hemodialysis late Friday (or on TH/Sat) as the patient is scheduled for an arteriogram on Friday morning.   SUBJECTIVE:   Rest pain right foot.  PHYSICAL EXAM:   Vitals:   07/01/16 1034 07/01/16 1729 07/01/16 2140 07/02/16 0410  BP: (!) 138/43 (!) 155/71 (!) 142/52 (!) 98/45  Pulse: 60 66 73 68  Resp: 16 16 14 16   Temp: 98.2 F (36.8 C) 97.5 F (36.4 C) 98.3 F (36.8 C) 98.3 F (36.8 C)  TempSrc: Oral Oral    SpO2: 98%   100%  Weight:   115 lb (52.2 kg)   Height:       No change and dry gangrene involving right great toe and right heel. Palpable femoral pulses.  LABS:   Lab Results  Component Value Date   WBC 9.6 07/02/2016   HGB 10.3 (L) 07/02/2016   HCT 31.5 (L) 07/02/2016   MCV 86.1 07/02/2016   PLT 163 07/02/2016   Lab Results  Component Value Date   CREATININE 4.49 (H) 07/01/2016   Lab Results  Component Value Date   INR 2.98 07/02/2016   CBG (last 3)   Recent Labs  07/01/16 1453 07/01/16 1610 07/01/16 2206  GLUCAP 113* 95 98    PROBLEM LIST:    Principal Problem:   Cellulitis Active Problems:   Hypothyroidism   CAD (coronary artery disease), native coronary  artery   Chronic combined systolic and diastolic congestive heart failure (HCC)   COPD (chronic obstructive pulmonary disease) (HCC)   Chronic anticoagulation w/ coumadin, goal INR 2.5-3.0 w/ AVR   Chronic midline low back pain without sciatica   ESRD on dialysis ([place])   H/O heart valve replacement with mechanical valve   PAD (peripheral artery disease) (HCC)   Diabetic foot infection (HCC)   Malnutrition of moderate degree   CURRENT MEDS:   . aspirin EC  81 mg Oral Daily  . calcitRIOL  0.5 mcg Oral Q M,W,F-HD  . calcium acetate  667 mg Oral TID WC  . feeding supplement  1 Container Oral BID BM  . feeding supplement (PRO-STAT SUGAR FREE 64)  30 mL Oral BID  . insulin aspart  0-5 Units Subcutaneous QHS  . insulin aspart  0-9 Units Subcutaneous TID WC  . levothyroxine  175 mcg Oral QAC breakfast  . metoCLOPramide  5 mg Oral BID AC  . metoprolol tartrate  25 mg Oral BID  . multivitamin  1 tablet Oral QHS  . polyethylene glycol  17 g Oral BID  . pregabalin  75 mg Oral Daily  . senna-docusate  1 tablet Oral Daily  . sodium chloride flush  3 mL Intravenous Q12H  . tacrolimus  2 mg Oral BID    Gae Gallop Beeper: [phone]  Office: [phone] 07/02/2016

## 2016-07-02 NOTE — Progress Notes (Signed)
ANTICOAGULATION CONSULT NOTE - Follow Up Consult  Pharmacy Consult for heparin Indication: mechanical AVR   Allergies  Allergen Reactions  . Acetaminophen Other (See Comments)    Liver transplant recipient   . Codeine Itching  . Mirtazapine Other (See Comments)    hallucination    Patient Measurements: Height: 5\' 4"  (162.6 cm) Weight: 117 lb 4.6 oz (53.2 kg) IBW/kg (Calculated) : 54.7 Heparin Dosing Weight: 53.2 kg  Vital Signs: Temp: 98.4 F (36.9 C) (05/23 1645) Temp Source: Oral (05/23 1645) BP: 111/51 (05/23 1645) Pulse Rate: 68 (05/23 1645)  Labs:  Recent Labs  07/01/16 0806 07/01/16 1826 07/02/16 0354 07/02/16 0858 07/02/16 1430 07/02/16 2038  HGB 11.0*  --  10.3* 10.4*  --   --   HCT 33.3*  --  31.5* 31.2*  --   --   PLT 169  --  163 173  --   --   LABPROT 27.8* 27.2* 31.6*  --   --  26.0*  INR 2.54 2.46 2.98  --   --  2.34  HEPARINUNFRC  --   --  0.21*  --  0.49 0.50  CREATININE 4.49*  --   --  5.96* 2.50*  --     Estimated Creatinine Clearance: 19.8 mL/min (A) (by C-G formula based on SCr of 2.5 mg/dL (H)).   Medications:  Scheduled:  . aspirin EC  81 mg Oral Daily  . calcitRIOL  0.5 mcg Oral Q M,W,F-HD  . calcium acetate  667 mg Oral TID WC  . feeding supplement  1 Container Oral BID BM  . feeding supplement (PRO-STAT SUGAR FREE 64)  30 mL Oral BID  . levothyroxine  175 mcg Oral QAC breakfast  . metoCLOPramide  5 mg Oral BID AC  . metoprolol tartrate  25 mg Oral BID  . multivitamin  1 tablet Oral QHS  . polyethylene glycol  17 g Oral BID  . pregabalin  75 mg Oral Daily  . senna-docusate  1 tablet Oral Daily  . tacrolimus  2 mg Oral BID   Infusions:  . cefTRIAXone (ROCEPHIN)  IV Stopped (07/02/16 1323)   And  . metronidazole Stopped (07/02/16 2112)  . heparin 850 Units/hr (07/02/16 0535)    Assessment: 61 yo female with mechanical AVR is currently on therapeutic heparin.  Heparin level is 0.5 and INR is 2.34 (heparin when INR  <2.5)  Goal of Therapy:  Heparin level 0.3-0.7 units/ml Monitor platelets by anticoagulation protocol: Yes   Plan:  - continue heparin at 850 units/hr - heparin level in am  Ruhee Enck, Tsz-Yin 07/02/2016,9:21 PM

## 2016-07-02 NOTE — Progress Notes (Signed)
[place] for heparin Indication: mechanical AVR   Allergies  Allergen Reactions  . Acetaminophen Other (See Comments)    Liver transplant recipient   . Codeine Itching  . Mirtazapine Other (See Comments)    hallucination    Patient Measurements: Height: 5\' 4"  (162.6 cm) Weight: 117 lb 4.6 oz (53.2 kg) IBW/kg (Calculated) : 54.7 Heparin Dosing Weight: 52.6 kg  Vital Signs: Temp: 97.7 F (36.5 C) (05/23 1254) Temp Source: Oral (05/23 1254) BP: 122/45 (05/23 1254) Pulse Rate: 70 (05/23 1254)  Labs:  Recent Labs  06/30/16 1845  07/01/16 0806 07/01/16 1826 07/02/16 0354 07/02/16 0858 07/02/16 1430  HGB 11.7*  --  11.0*  --  10.3* 10.4*  --   HCT 35.1*  --  33.3*  --  31.5* 31.2*  --   PLT 172  --  169  --  163 173  --   LABPROT  --   < > 27.8* 27.2* 31.6*  --   --   INR  --   < > 2.54 2.46 2.98  --   --   HEPARINUNFRC  --   --   --   --  0.21*  --  0.49  CREATININE 3.54*  --  4.49*  --   --  5.96*  --   < > = values in this interval not displayed.  Estimated Creatinine Clearance: 8.3 mL/min (A) (by C-G formula based on SCr of 5.96 mg/dL (H)).   Medications:  Infusions:  . cefTRIAXone (ROCEPHIN)  IV Stopped (07/02/16 1323)   And  . metronidazole Stopped (07/02/16 1356)  . heparin 850 Units/hr (07/02/16 0535)    Assessment: 61 yo female with mechanical AVR on warfarin PTA which is being held for an arteriogram on Friday. Pharmacy consulted to begin heparin drip for INR <2.5.  Heparin level is therapeutic at 0.49 on 850 units/hr. Despite holding warfarin, INR rose to 2.98 today. Will consider holding heparin drip if INR continues to trend up. No bleeding noted, CBC is stable.  Goal of Therapy:  Heparin level 0.3-0.7 units/ml Monitor platelets by anticoagulation protocol: Yes   Plan:  - Continue heparin drip at 850 units/hr - 6 hr confirmatory heparin level and INR - Daily heparin level and CBC - Monitor for s/sx of  bleeding   Renold Genta, PharmD, BCPS Clinical Pharmacist Phone for today - [place] - [phone] 07/02/2016 3:31 PM

## 2016-07-02 NOTE — Progress Notes (Signed)
Inpatient Diabetes Program Recommendations  AACE/ADA: New Consensus Statement on Inpatient Glycemic Control (2015)  Target Ranges:  Prepandial:   less than 140 mg/dL      Peak postprandial:   less than 180 mg/dL (1-2 hours)      Critically ill patients:  140 - 180 mg/dL  Results for SHRISTI, SCHEIB (MRN [id number]) as of 07/02/2016 11:29  Ref. Range 07/01/2016 07:53 07/01/2016 12:17 07/01/2016 13:36 07/01/2016 13:56 07/01/2016 14:53 07/01/2016 16:10 07/01/2016 22:06 07/02/2016 07:32  Glucose-Capillary Latest Ref Range: 65 - 99 mg/dL 77 63 (L) 49 (L) 62 (L) 113 (H) 95 98 69    Review of Glycemic Control  Diabetes history: DM2 Outpatient Diabetes medications: none Current orders for Inpatient glycemic control: Novolog 0-9 units TID with meals, Novolog 0-5 units QHS  Inpatient Diabetes Program Recommendations: Correction (SSI): Noted no insulin has been given since admitted and CBGs are trending low. Please consider discontinuing Novolog correction scale. HgbA1C: A1C 5.6% on 05/27/16 indicating an average glucose of 114 mg/dl over the past 2-3 months.  Thanks, Barnie Alderman, RN, MSN, CDE Diabetes Coordinator Inpatient Diabetes Program [phone] (Team Pager from 8am to 5pm)

## 2016-07-03 DIAGNOSIS — J42 Unspecified chronic bronchitis: Secondary | ICD-10-CM

## 2016-07-03 LAB — CBC
HCT: 33.1 % — ABNORMAL LOW (ref 36.0–46.0)
HEMOGLOBIN: 11 g/dL — AB (ref 12.0–15.0)
MCH: 28.7 pg (ref 26.0–34.0)
MCHC: 33.2 g/dL (ref 30.0–36.0)
MCV: 86.4 fL (ref 78.0–100.0)
Platelets: 159 10*3/uL (ref 150–400)
RBC: 3.83 MIL/uL — ABNORMAL LOW (ref 3.87–5.11)
RDW: 18.2 % — ABNORMAL HIGH (ref 11.5–15.5)
WBC: 10.4 10*3/uL (ref 4.0–10.5)

## 2016-07-03 LAB — MRSA CULTURE: CULTURE: NOT DETECTED

## 2016-07-03 LAB — GLUCOSE, CAPILLARY
GLUCOSE-CAPILLARY: 70 mg/dL (ref 65–99)
GLUCOSE-CAPILLARY: 75 mg/dL (ref 65–99)
Glucose-Capillary: 83 mg/dL (ref 65–99)
Glucose-Capillary: 111 mg/dL — ABNORMAL HIGH (ref 65–99)

## 2016-07-03 LAB — PROTIME-INR
INR: 2.08
PROTHROMBIN TIME: 23.7 s — AB (ref 11.4–15.2)

## 2016-07-03 LAB — HEPARIN LEVEL (UNFRACTIONATED): HEPARIN UNFRACTIONATED: 0.37 [IU]/mL (ref 0.30–0.70)

## 2016-07-03 MED ORDER — MORPHINE SULFATE (PF) 2 MG/ML IV SOLN
1.0000 mg | INTRAVENOUS | Status: DC | PRN
  Administered 2016-07-03 (×2): 2 mg via INTRAVENOUS
  Administered 2016-07-04: 1 mg via INTRAVENOUS
  Filled 2016-07-03 (×3): qty 1

## 2016-07-03 MED ORDER — MORPHINE SULFATE (PF) 2 MG/ML IV SOLN
2.0000 mg | Freq: Once | INTRAVENOUS | Status: AC
  Administered 2016-07-03: 2 mg via INTRAVENOUS
  Filled 2016-07-03: qty 1

## 2016-07-03 MED ORDER — SENNOSIDES-DOCUSATE SODIUM 8.6-50 MG PO TABS
1.0000 | ORAL_TABLET | Freq: Two times a day (BID) | ORAL | Status: DC
  Administered 2016-07-03 – 2016-07-10 (×8): 1 via ORAL
  Filled 2016-07-03 (×12): qty 1

## 2016-07-03 NOTE — Progress Notes (Signed)
[facility]                                                                              Patient Demographics  [name], is a 61 y.o. female, DOB - [birth year], UXL:[id number]  Admit date - 06/30/2016   Admitting Physician Vianne Bulls, MD  Outpatient Primary MD for the patient is [name], MD  Outpatient specialists:   LOS - 3  days    Chief Complaint  Patient presents with  . Wound Check       Brief summary   [name] Kosa [name] a 61 y.o.femalewith medical history significant for hypertension, diabetes, coronary artery disease, chronic back pain, depression, end-stage renal disease on HD, mechanical aortic valve on warfarin, and history of liver transplantation, H/o PAD s/p L BKA in 3/18 who presents to the emergency department for evaluation of wounds on the right foot and left BKA stump   Assessment & Plan   Right foot nonhealing ulcers with gangrenous changes with underlying PVD - Underlying risk factors including diabetes, ongoing tobacco abuse, ESRD - ABI right could not be ascertained due to noncompressible vessels in the dorsalis pedis artery, from posterior tibial artery indicates severe reduction in the arterial flow. Left could not be obtained due to BKA - Status post left BKA 3 months ago - having lot of pain in right foot, added morphine prn IV and cont oral dilaudid PRN   - Continue Flagyl, ceftriaxone - Per vascular surgery, arteriogram on Friday 5/25, on IV heparin    Active problems Pressure ulcers Right heel: 3cm x 3cm x0.1cm; no drainage  Right great toe: 3cm x 2cm x 0cm:  intact, no drainage Left stump site: 1.0cm x 3.5cm x 0.3; minimal drainage, no odor - Wound care following, Vascular  surgery following  Mechanical AVR - Holding Coumadin for procedure - Continue IV heparin  ESRD on hemodialysis MWF - Nephrology following closely, hemodialysis per schedule  Coronary artery disease - Currently stable, no chest pain,  continue aspirin, beta blocker  Diabetes mellitus with hypoglycemia - Not on any meds, CBG stable  - DC sliding scale  History of liver transplantation - Continue Prograf  Hypertension - Currently stable, continue Lopressor  Code Status:Partial  DVT Prophylaxis: Heparin drip, Coumadin on hold  Family Communication: Discussed in detail with the patient, all imaging results, lab results explained to the patient   Disposition Plan   Time Spent in minutes  25 minutes  Procedures:  ABI Right - ABI could not be ascertained due to non compressible   vessel in the dorsalis pedis artery. The ABI calculated from the   posterior tibial artery indicate s a severe reduction in arterial   flow at res. - Left - ABI cannot be obtained due to below the knee amputation.  Consultants:   Nephrology Vascular surgery  Antimicrobials:   IV ceftriaxone 5/21>  IV Flagyl 5/21>   Medications  Scheduled Meds: . aspirin EC  81 mg Oral Daily  . calcitRIOL  0.5 mcg Oral Q M,W,F-HD  . calcium acetate  667 mg Oral TID WC  . feeding supplement  1 Container  Oral BID BM  . feeding supplement (PRO-STAT SUGAR FREE 64)  30 mL Oral BID  . levothyroxine  175 mcg Oral QAC breakfast  . metoCLOPramide  5 mg Oral BID AC  . metoprolol tartrate  25 mg Oral BID  . multivitamin  1 tablet Oral QHS  . polyethylene glycol  17 g Oral BID  . pregabalin  75 mg Oral Daily  . senna-docusate  1 tablet Oral BID  . tacrolimus  2 mg Oral BID   Continuous Infusions: . cefTRIAXone (ROCEPHIN)  IV 2 g (07/03/16 0830)   And  . metronidazole Stopped (07/03/16 2035)  . heparin 850 Units/hr (07/03/16 0018)   PRN Meds:.acetaminophen **OR** acetaminophen, HYDROmorphone, methocarbamol, morphine injection, ondansetron **OR** ondansetron (ZOFRAN) IV, oxyCODONE   Antibiotics   Anti-infectives    Start     Dose/Rate Route Frequency Ordered Stop   07/01/16 0800  cefTRIAXone (ROCEPHIN) 2 g in dextrose 5 % 50 mL IVPB     2  g 100 mL/hr over 30 Minutes Intravenous Every 24 hours 06/30/16 2027     07/01/16 0800  metroNIDAZOLE (FLAGYL) IVPB 500 mg     500 mg 100 mL/hr over 60 Minutes Intravenous Every 8 hours 06/30/16 2027     06/30/16 2000  piperacillin-tazobactam (ZOSYN) IVPB 3.375 g  Status:  Discontinued     3.375 g 12.5 mL/hr over 240 Minutes Intravenous Every 12 hours 06/30/16 1925 06/30/16 2027        Subjective:   [name] was seen and examined today. Overall uncomfortable with pain in the right foot, sharp and throbbing pain, 10/10.  Patient denies dizziness, chest pain, shortness of breath, abdominal pain, N/V/D/C, new weakness, numbess, tingling. No acute events overnight except pain in the foot.    Objective:   Vitals:   07/02/16 1645 07/02/16 2121 07/03/16 0531 07/03/16 1022  BP: (!) 111/51 (!) 135/44 (!) 141/51 (!) 143/52  Pulse: 68 71 67 66  Resp: 18 16 14 16   Temp: 98.4 F (36.9 C) 98.6 F (37 C) 98.2 F (36.8 C) 97.7 F (36.5 C)  TempSrc: Oral   Oral  SpO2: 98% 99% 100% 95%  Weight:   52.9 kg (116 lb 10 oz)   Height:        Intake/Output Summary (Last 24 hours) at 07/03/16 1048 Last data filed at 07/03/16 1023  Gross per 24 hour  Intake          1160.48 ml  Output            -2100 ml  Net          3260.48 ml     Wt Readings from Last 3 Encounters:  07/03/16 52.9 kg (116 lb 10 oz)  05/28/16 49.1 kg (108 lb 3.9 oz)  05/20/16 54.4 kg (120 lb)     Exam  General:Alert and oriented 3, tearful with pain in her right foot  HEENT:  PERRLA, EOMI  Neck: Supple, no JVD  Cardiovascular: S1 and S2 clear, mechanical valve click  Respiratory: CTAP, no wheezing, rales or rhonchi  Gastrointestinal: Soft, ND, NT, NBS  Ext: no cyanosis clubbing. Dry gangrene on the right great toe and right heel , no drainage  Neuro: no new deficits  Skin: Dry gangrene on the right great toe and right heel   Psych:  tearful and anxious   Data Reviewed:  I have personally reviewed  following labs and imaging studies  Micro Results Recent Results (from the past 240 hour(s))  MRSA PCR Screening     Status: Abnormal   Collection Time: 06/30/16 10:15 PM  Result Value Ref Range Status   MRSA by PCR INVALID RESULTS, SPECIMEN SENT FOR CULTURE (A) NEGATIVE Final    Comment: Results Called toChapman Moss RN, AT [phone] 07/01/16 BY D.VANHOOK   MRSA culture     Status: None (Preliminary result)   Collection Time: 06/30/16 10:15 PM  Result Value Ref Range Status   Specimen Description NASAL SWAB  Final   Special Requests NONE  Final   Culture CULTURE REINCUBATED FOR BETTER GROWTH  Final   Report Status PENDING  Incomplete  Blood Cultures x 2 sites     Status: None (Preliminary result)   Collection Time: 06/30/16 10:39 PM  Result Value Ref Range Status   Specimen Description BLOOD LEFT HAND  Final   Special Requests   Final    BOTTLES DRAWN AEROBIC AND ANAEROBIC Blood Culture adequate volume   Culture PENDING  Incomplete   Report Status PENDING  Incomplete  Blood Cultures x 2 sites     Status: None (Preliminary result)   Collection Time: 06/30/16 10:47 PM  Result Value Ref Range Status   Specimen Description BLOOD RIGHT HAND  Final   Special Requests   Final    BOTTLES DRAWN AEROBIC AND ANAEROBIC Blood Culture adequate volume   Culture PENDING  Incomplete   Report Status PENDING  Incomplete    Radiology Reports Dg Tibia/fibula Left  Result Date: 06/30/2016 CLINICAL DATA:  Amputation wound, check for bony destruction EXAM: LEFT TIBIA AND FIBULA - 2 VIEW COMPARISON:  None. FINDINGS: Changes consistent with a below the knee amputation are seen. Mild osteopenia is noted. No bony erosion to suggest osteomyelitis is seen. IMPRESSION: No evidence of osteomyelitis. Electronically Signed   By: Inez Catalina M.D.   On: 06/30/2016 18:48   Dg Foot Complete Right  Result Date: 06/30/2016 CLINICAL DATA:  Open wound that will not heal since April. Wounds of the first digit and heel.  EXAM: RIGHT FOOT COMPLETE - 3+ VIEW COMPARISON:  None. FINDINGS: The patient's heel wound is subtly visible. No soft tissue emphysema or opaque foreign body. No erosion to suggest osteomyelitis. Osteopenia and diffuse arterially calcification. IMPRESSION: No evidence of osteomyelitis or opaque foreign body. Electronically Signed   By: Monte Fantasia M.D.   On: 06/30/2016 13:41    Lab Data:  CBC:  Recent Labs Lab 06/30/16 1845 07/01/16 0806 07/02/16 0354 07/02/16 0858 07/03/16 0416  WBC 10.1 9.4 9.6 10.6* 10.4  NEUTROABS 5.4 4.6  --   --   --   HGB 11.7* 11.0* 10.3* 10.4* 11.0*  HCT 35.1* 33.3* 31.5* 31.2* 33.1*  MCV 87.1 87.4 86.1 85.7 86.4  PLT 172 169 163 173 357   Basic Metabolic Panel:  Recent Labs Lab 06/30/16 1845 07/01/16 0806 07/02/16 0858 07/02/16 1430  NA 135 138 139 134*  K 4.0 3.8 3.6 3.1*  CL 97* 100* 100* 97*  CO2 28 30 27 28   GLUCOSE 127* 91 110* 128*  BUN 11 16 22* <5*  CREATININE 3.54* 4.49* 5.96* 2.50*  CALCIUM 8.1* 8.4* 8.0* 7.4*  PHOS  --   --  6.5* 2.8   GFR: Estimated Creatinine Clearance: 19.7 mL/min (A) (by C-G formula based on SCr of 2.5 mg/dL (H)). Liver Function Tests:  Recent Labs Lab 06/30/16 1845 07/02/16 0858 07/02/16 1430  AST 90*  --   --   ALT 38  --   --  ALKPHOS 243*  --   --   BILITOT 1.0  --   --   PROT 6.8  --   --   ALBUMIN 2.0* 1.7* 1.8*   No results for input(s): LIPASE, AMYLASE in the last 168 hours. No results for input(s): AMMONIA in the last 168 hours. Coagulation Profile:  Recent Labs Lab 07/01/16 0806 07/01/16 1826 07/02/16 0354 07/02/16 2038 07/03/16 0416  INR 2.54 2.46 2.98 2.34 2.08   Cardiac Enzymes: No results for input(s): CKTOTAL, CKMB, CKMBINDEX, TROPONINI in the last 168 hours. BNP (last 3 results) No results for input(s): PROBNP in the last 8760 hours. HbA1C:  Recent Labs  06/30/16 2239 07/01/16 1100  HGBA1C 5.7* 5.7*   CBG:  Recent Labs Lab 07/02/16 0732 07/02/16 1251  07/02/16 1646 07/02/16 2120 07/03/16 0809  GLUCAP 69 81 129* 154* 83   Lipid Profile: No results for input(s): CHOL, HDL, LDLCALC, TRIG, CHOLHDL, LDLDIRECT in the last 72 hours. Thyroid Function Tests: No results for input(s): TSH, T4TOTAL, FREET4, T3FREE, THYROIDAB in the last 72 hours. Anemia Panel: No results for input(s): VITAMINB12, FOLATE, FERRITIN, TIBC, IRON, RETICCTPCT in the last 72 hours. Urine analysis:    Component Value Date/Time   COLORURINE YELLOW 11/02/2012 0428   APPEARANCEUR CLOUDY (A) 11/02/2012 0428   LABSPEC 1.011 11/02/2012 0428   PHURINE 8.0 11/02/2012 0428   GLUCOSEU 250 (A) 11/02/2012 0428   HGBUR LARGE (A) 11/02/2012 0428   BILIRUBINUR NEGATIVE 11/02/2012 0428   KETONESUR NEGATIVE 11/02/2012 0428   PROTEINUR >300 (A) 11/02/2012 0428   UROBILINOGEN 0.2 11/02/2012 0428   NITRITE NEGATIVE 11/02/2012 0428   LEUKOCYTESUR NEGATIVE 11/02/2012 0428     Elantra Caprara M.D. [facility] 07/03/2016, 10:48 AM  Pager: [phone] Between 7am to 7pm - call Pager - [phone]  After 7pm go to [email or web address] - password [email or web address]  Call night coverage person covering after 7pm

## 2016-07-03 NOTE — Progress Notes (Signed)
[facility] Rounding/Procedure Notes  Rounding Note  Subjective/Interval History:  Having a lot of foot pain  For arteriogram tomorrow  HD yesterday no issues   Objective Vital signs in last 24 hours: Vitals:   07/02/16 1254 07/02/16 1645 07/02/16 2121 07/03/16 0531  BP: (!) 122/45 (!) 111/51 (!) 135/44 (!) 141/51  Pulse: 70 68 71 67  Resp: 18 18 16 14   Temp: 97.7 F (36.5 C) 98.4 F (36.9 C) 98.6 F (37 C) 98.2 F (36.8 C)  TempSrc: Oral Oral    SpO2: 97% 98% 99% 100%  Weight:    52.9 kg (116 lb 10 oz)  Height:       Weight change: 1.036 kg (2 lb 4.6 oz)  Intake/Output Summary (Last 24 hours) at 07/03/16 0945 Last data filed at 07/03/16 0600  Gross per 24 hour  Intake           920.48 ml  Output            -2100 ml  Net          3020.48 ml   Physical Exam:  Blood pressure (!) 141/51, pulse 67, temperature 98.2 F (36.8 C), resp. rate 14, height 5\' 4"  (1.626 m), weight 52.9 kg (116 lb 10 oz), SpO2 100 %.  Very nice older AAF NAD No JVD Lungs clear Healed sternotomy scar Regular rhythm S1S2 No S3 Mech valve click. 1/6 murmur LSB Abd no focal tenderness Scars from liver transplant surgery w/extensive keloid formation R BKA with unhealed area L foot wrapped/toes dark/cool to touch, some odor R IJ TDC with dsg intact    Recent Labs Lab 06/30/16 1845 07/01/16 0806 07/02/16 0858 07/02/16 1430  NA 135 138 139 134*  K 4.0 3.8 3.6 3.1*  CL 97* 100* 100* 97*  CO2 28 30 27 28   GLUCOSE 127* 91 110* 128*  BUN 11 16 22* <5*  CREATININE 3.54* 4.49* 5.96* 2.50*  CALCIUM 8.1* 8.4* 8.0* 7.4*  PHOS  --   --  6.5* 2.8    Recent Labs Lab 06/30/16 1845 07/02/16 0858 07/02/16 1430  AST 90*  --   --   ALT 38  --   --   ALKPHOS 243*  --   --   BILITOT 1.0  --   --   PROT 6.8  --   --   ALBUMIN 2.0* 1.7* 1.8*    Recent Labs Lab 06/30/16 1845 07/01/16 0806 07/02/16 0354 07/02/16 0858 07/03/16 0416  WBC 10.1 9.4 9.6 10.6* 10.4  NEUTROABS 5.4 4.6  --   --   --    HGB 11.7* 11.0* 10.3* 10.4* 11.0*  HCT 35.1* 33.3* 31.5* 31.2* 33.1*  MCV 87.1 87.4 86.1 85.7 86.4  PLT 172 169 163 173 159    Recent Labs Lab 07/02/16 0732 07/02/16 1251 07/02/16 1646 07/02/16 2120 07/03/16 0809  GLUCAP 69 81 129* 154* 83   Medications: . cefTRIAXone (ROCEPHIN)  IV 2 g (07/03/16 0830)   And  . metronidazole Stopped (07/03/16 4081)  . heparin 850 Units/hr (07/03/16 0018)   . aspirin EC  81 mg Oral Daily  . calcitRIOL  0.5 mcg Oral Q M,W,F-HD  . calcium acetate  667 mg Oral TID WC  . feeding supplement  1 Container Oral BID BM  . feeding supplement (PRO-STAT SUGAR FREE 64)  30 mL Oral BID  . levothyroxine  175 mcg Oral QAC breakfast  . metoCLOPramide  5 mg Oral BID AC  . metoprolol tartrate  25 mg Oral BID  . multivitamin  1 tablet Oral QHS  . polyethylene glycol  17 g Oral BID  . pregabalin  75 mg Oral Daily  . senna-docusate  1 tablet Oral Daily  . tacrolimus  2 mg Oral BID   Background: 61 y.o. female with ESRD on HD, HTN, DM, CAD, chronic back pain, 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 Scot Dock. 5/22 for further evaluation and treatment of right foot gangrene secondary to her PAD. Seen by Dr. Scot Dock and planning arteriogram. We were asked to provide HD.   Dialysis Orders:  [facility] MWF 3.5h 180F  BFR 450/A1.5x  2K/2Ca  Na Linear  EDW 50 kg No Heparin -Calcitriol 0.5 mcg PO q HD -Mircera 50 mcg IV q 4 weeks    Assessment/Recommendations  1.  PAD/R foot gangrene/recent L BKA (not healed) - empiric abx (Rocephn and flagyl) per primary/VVS following planning arteriogram Friday to assess vascular adequacy for healing.  2.  ESRD -  MWF . We can do HD late on Friday after a'gram done so as not to interfere.  Use 4K bath K 3.1  3.  Anemia  - Hgb 11.0 today  Follow  4.  Metabolic bone disease -  Cont VDRA/Ca acetate binder  5.  Nutrition - Albumin 2.0 renal diet/vitamins Add prostat  6. S/p  mechanical AVR - holding Coumadin for procedure. Pharmacy managing anticoagulation 7. H/o liver transplant - on Prograf 8. DM - per primary    [name] PA-C [place] Pager [phone] 07/03/2016,9:45 AM   I have seen and examined this patient and agree with plan and assessment in the above note with renal recommendations/intervention highlighted. Arteriogram tomorrow, HD to follow.   Shauntea Lok B,MD 07/03/2016 12:47 PM

## 2016-07-03 NOTE — Progress Notes (Signed)
   VASCULAR SURGERY ASSESSMENT & PLAN:   The patient is scheduled for an aortogram with right lower extremity runoff and possible angioplasty and stenting of the right lower extremity tomorrow by Dr. Ruta Hinds. Her noninvasive studies show dampened monophasic signals in the right foot. Given the dry gangrene of her right heel and right great toe this is clearly a limb threatening situation. She is not a good candidate for bypass given her cardiac history and end-stage renal disease. If she has an endovascular option and certainly this would be ideal. If not, we'll make further recommendations pending the results of her arteriogram.  Her Coumadin is being held. Her INR today is 2.08. We will stop her heparin on call to the procedure. I will give her a small dose of vitamin K.   SUBJECTIVE:   Still with rest pain of the right foot.  PHYSICAL EXAM:   Vitals:   07/02/16 1645 07/02/16 2121 07/03/16 0531 07/03/16 1022  BP: (!) 111/51 (!) 135/44 (!) 141/51 (!) 143/52  Pulse: 68 71 67 66  Resp: 18 16 14 16   Temp: 98.4 F (36.9 C) 98.6 F (37 C) 98.2 F (36.8 C) 97.7 F (36.5 C)  TempSrc: Oral   Oral  SpO2: 98% 99% 100% 95%  Weight:   116 lb 10 oz (52.9 kg)   Height:       No change in dry gangrene of right heel and right great toe.  LABS:   Lab Results  Component Value Date   WBC 10.4 07/03/2016   HGB 11.0 (L) 07/03/2016   HCT 33.1 (L) 07/03/2016   MCV 86.4 07/03/2016   PLT 159 07/03/2016   Lab Results  Component Value Date   CREATININE 2.50 (H) 07/02/2016   Lab Results  Component Value Date   INR 2.08 07/03/2016   CBG (last 3)   Recent Labs  07/02/16 2120 07/03/16 0809 07/03/16 1241  GLUCAP 154* 83 111*    PROBLEM LIST:    Principal Problem:   Cellulitis Active Problems:   Hypothyroidism   CAD (coronary artery disease), native coronary artery   Chronic combined systolic and diastolic congestive heart failure (HCC)   COPD (chronic obstructive pulmonary  disease) (HCC)   Chronic anticoagulation w/ coumadin, goal INR 2.5-3.0 w/ AVR   Chronic midline low back pain without sciatica   ESRD on dialysis ([facility])   H/O heart valve replacement with mechanical valve   PAD (peripheral artery disease) (HCC)   Diabetic foot infection (HCC)   Malnutrition of moderate degree   CURRENT MEDS:   . aspirin EC  81 mg Oral Daily  . calcitRIOL  0.5 mcg Oral Q M,W,F-HD  . calcium acetate  667 mg Oral TID WC  . feeding supplement  1 Container Oral BID BM  . feeding supplement (PRO-STAT SUGAR FREE 64)  30 mL Oral BID  . levothyroxine  175 mcg Oral QAC breakfast  . metoCLOPramide  5 mg Oral BID AC  . metoprolol tartrate  25 mg Oral BID  . multivitamin  1 tablet Oral QHS  . polyethylene glycol  17 g Oral BID  . pregabalin  75 mg Oral Daily  . senna-docusate  1 tablet Oral BID  . tacrolimus  2 mg Oral BID    Gae Gallop Beeper: [phone] Office: [phone] 07/03/2016

## 2016-07-03 NOTE — Progress Notes (Signed)
[place] for heparin Indication: mechanical AVR   Allergies  Allergen Reactions  . Acetaminophen Other (See Comments)    Liver transplant recipient   . Codeine Itching  . Mirtazapine Other (See Comments)    hallucination    Patient Measurements: Height: 5\' 4"  (162.6 cm) Weight: 116 lb 10 oz (52.9 kg) IBW/kg (Calculated) : 54.7 Heparin Dosing Weight: 52.6 kg  Vital Signs: Temp: 98.2 F (36.8 C) (05/24 0531) BP: 141/51 (05/24 0531) Pulse Rate: 67 (05/24 0531)  Labs:  Recent Labs  07/01/16 0806  07/02/16 0354 07/02/16 0858 07/02/16 1430 07/02/16 2038 07/03/16 0416  HGB 11.0*  --  10.3* 10.4*  --   --  11.0*  HCT 33.3*  --  31.5* 31.2*  --   --  33.1*  PLT 169  --  163 173  --   --  159  LABPROT 27.8*  < > 31.6*  --   --  26.0* 23.7*  INR 2.54  < > 2.98  --   --  2.34 2.08  HEPARINUNFRC  --   < > 0.21*  --  0.49 0.50 0.37  CREATININE 4.49*  --   --  5.96* 2.50*  --   --   < > = values in this interval not displayed.  Estimated Creatinine Clearance: 19.7 mL/min (A) (by C-G formula based on SCr of 2.5 mg/dL (H)).   Medications:  Infusions:  . cefTRIAXone (ROCEPHIN)  IV 2 g (07/03/16 0830)   And  . metronidazole Stopped (07/03/16 3614)  . heparin 850 Units/hr (07/03/16 0018)    Assessment: 61 yo female with mechanical AVR on warfarin PTA which is being held for an arteriogram on Friday. Pharmacy consulted to begin heparin drip for INR <2.5.  Heparin level is therapeutic at 0.37 on 850 units/hr. INR down to 2.08. No bleeding noted, CBC is stable.  Goal of Therapy:  Heparin level 0.3-0.7 units/ml Monitor platelets by anticoagulation protocol: Yes   Plan:  - Continue heparin drip at 850 units/hr - Daily heparin level and CBC - Monitor for s/sx of bleeding - F/u resuming warfarin as able   Renold Genta, PharmD, BCPS Clinical Pharmacist Phone for today - [place] - [phone] 07/03/2016 8:40 AM

## 2016-07-04 ENCOUNTER — Encounter (HOSPITAL_COMMUNITY)
Admission: EM | Disposition: A | Discharge status: Rehabilitation Facility | Payer: Self-pay | Source: Home / Self Care | Attending: Internal Medicine

## 2016-07-04 ENCOUNTER — Encounter (HOSPITAL_COMMUNITY): Payer: Self-pay | Admitting: Vascular Surgery

## 2016-07-04 HISTORY — PX: ABDOMINAL AORTOGRAM W/LOWER EXTREMITY: CATH118223

## 2016-07-04 LAB — CBC
HEMATOCRIT: 31.5 % — AB (ref 36.0–46.0)
HEMOGLOBIN: 10.7 g/dL — AB (ref 12.0–15.0)
MCH: 28.8 pg (ref 26.0–34.0)
MCHC: 34 g/dL (ref 30.0–36.0)
MCV: 84.7 fL (ref 78.0–100.0)
Platelets: 163 10*3/uL (ref 150–400)
RBC: 3.72 MIL/uL — ABNORMAL LOW (ref 3.87–5.11)
RDW: 17.9 % — ABNORMAL HIGH (ref 11.5–15.5)
WBC: 9.4 10*3/uL (ref 4.0–10.5)

## 2016-07-04 LAB — BASIC METABOLIC PANEL
Anion gap: 13 (ref 5–15)
BUN: 16 mg/dL (ref 6–20)
CHLORIDE: 96 mmol/L — AB (ref 101–111)
CO2: 25 mmol/L (ref 22–32)
Calcium: 8.8 mg/dL — ABNORMAL LOW (ref 8.9–10.3)
Creatinine, Ser: 5.26 mg/dL — ABNORMAL HIGH (ref 0.44–1.00)
GFR calc Af Amer: 9 mL/min — ABNORMAL LOW (ref >60)
GFR calc non Af Amer: 8 mL/min — ABNORMAL LOW (ref >60)
GLUCOSE: 156 mg/dL — AB (ref 65–99)
POTASSIUM: 3.3 mmol/L — AB (ref 3.5–5.1)
Sodium: 134 mmol/L — ABNORMAL LOW (ref 135–145)

## 2016-07-04 LAB — GLUCOSE, CAPILLARY
GLUCOSE-CAPILLARY: 85 mg/dL (ref 65–99)
GLUCOSE-CAPILLARY: 53 mg/dL — AB (ref 65–99)
Glucose-Capillary: 60 mg/dL — ABNORMAL LOW (ref 65–99)
Glucose-Capillary: 29 mg/dL — CL (ref 65–99)
Glucose-Capillary: 101 mg/dL — ABNORMAL HIGH (ref 65–99)

## 2016-07-04 LAB — HEPARIN LEVEL (UNFRACTIONATED): Heparin Unfractionated: 0.29 IU/mL — ABNORMAL LOW (ref 0.30–0.70)

## 2016-07-04 LAB — PROTIME-INR
INR: 2
INR: 1.35
PROTHROMBIN TIME: 16.8 s — AB (ref 11.4–15.2)
Prothrombin Time: 23 seconds — ABNORMAL HIGH (ref 11.4–15.2)

## 2016-07-04 SURGERY — ABDOMINAL AORTOGRAM W/LOWER EXTREMITY
Anesthesia: LOCAL

## 2016-07-04 MED ORDER — GI COCKTAIL ~~LOC~~
30.0000 mL | Freq: Three times a day (TID) | ORAL | Status: DC | PRN
  Administered 2016-07-04: 30 mL via ORAL
  Filled 2016-07-04: qty 30

## 2016-07-04 MED ORDER — LABETALOL HCL 5 MG/ML IV SOLN: 10.0000 mg | INTRAVENOUS | Status: DC | PRN

## 2016-07-04 MED ORDER — HEPARIN SODIUM (PORCINE) 1000 UNIT/ML DIALYSIS: 1000.0000 [IU] | INTRAMUSCULAR | Status: DC | PRN

## 2016-07-04 MED ORDER — HYDRALAZINE HCL 20 MG/ML IJ SOLN: 5.0000 mg | INTRAMUSCULAR | Status: DC | PRN

## 2016-07-04 MED ORDER — ALTEPLASE 2 MG IJ SOLR: 2.0000 mg | Freq: Once | INTRAMUSCULAR | Status: DC | PRN

## 2016-07-04 MED ORDER — METOPROLOL TARTRATE 5 MG/5ML IV SOLN: 2.0000 mg | INTRAVENOUS | Status: DC | PRN

## 2016-07-04 MED ORDER — SODIUM CHLORIDE 0.9 % IV SOLN: 100.0000 mL | INTRAVENOUS | Status: DC | PRN

## 2016-07-04 MED ORDER — VITAMIN K1 10 MG/ML IJ SOLN: 2.0000 mg | Freq: Once | INTRAMUSCULAR | Status: DC

## 2016-07-04 MED ORDER — SODIUM CHLORIDE 0.9 % IV SOLN
62.5000 mg | INTRAVENOUS | Status: DC
  Administered 2016-07-05: 62.5 mg via INTRAVENOUS
  Filled 2016-07-04: qty 5

## 2016-07-04 MED ORDER — MORPHINE SULFATE (PF) 4 MG/ML IV SOLN
1.0000 mg | INTRAVENOUS | Status: DC | PRN
  Administered 2016-07-04 – 2016-07-07 (×5): 2 mg via INTRAVENOUS
  Administered 2016-07-08: 0.5 mg via INTRAVENOUS
  Administered 2016-07-08 – 2016-07-09 (×5): 2 mg via INTRAVENOUS
  Filled 2016-07-04 (×11): qty 1

## 2016-07-04 MED ORDER — DOCUSATE SODIUM 100 MG PO CAPS
100.0000 mg | ORAL_CAPSULE | Freq: Every day | ORAL | Status: DC
  Administered 2016-07-05 – 2016-07-10 (×4): 100 mg via ORAL
  Filled 2016-07-04 (×5): qty 1

## 2016-07-04 MED ORDER — HEPARIN (PORCINE) IN NACL 2-0.9 UNIT/ML-% IJ SOLN
INTRAMUSCULAR | Status: AC
  Filled 2016-07-04: qty 1000

## 2016-07-04 MED ORDER — SODIUM CHLORIDE 0.9 % IV SOLN
Freq: Once | INTRAVENOUS | Status: AC
  Administered 2016-07-04: 08:00:00 via INTRAVENOUS

## 2016-07-04 MED ORDER — LIDOCAINE HCL 1 % IJ SOLN
INTRAMUSCULAR | Status: AC
  Filled 2016-07-04: qty 20

## 2016-07-04 MED ORDER — HEPARIN (PORCINE) IN NACL 2-0.9 UNIT/ML-% IJ SOLN
INTRAMUSCULAR | Status: AC | PRN
  Administered 2016-07-04: 1000 mL

## 2016-07-04 MED ORDER — VITAMIN K1 10 MG/ML IJ SOLN
2.0000 mg | Freq: Once | INTRAVENOUS | Status: AC
  Administered 2016-07-04: 2 mg via INTRAVENOUS
  Filled 2016-07-04: qty 0.2

## 2016-07-04 MED ORDER — HEPARIN (PORCINE) IN NACL 100-0.45 UNIT/ML-% IJ SOLN: 850.0000 [IU]/h | INTRAMUSCULAR | Status: DC

## 2016-07-04 MED ORDER — LIDOCAINE HCL (PF) 1 % IJ SOLN
INTRAMUSCULAR | Status: DC | PRN
  Administered 2016-07-04: 15 mL

## 2016-07-04 MED ORDER — DEXTROSE 50 % IV SOLN
INTRAVENOUS | Status: AC
  Administered 2016-07-04: 25 mL
  Filled 2016-07-04: qty 50

## 2016-07-04 MED ORDER — IODIXANOL 320 MG/ML IV SOLN
INTRAVENOUS | Status: DC | PRN
  Administered 2016-07-04: 175 mL via INTRA_ARTERIAL

## 2016-07-04 SURGICAL SUPPLY — 19 items
CATH ANGIO 5F PIGTAIL 65CM (CATHETERS) ×2 IMPLANT
CATH CROSS OVER TEMPO 5F (CATHETERS) ×4 IMPLANT
CATH SOFT-VU 4F 65 STRAIGHT (CATHETERS) ×1 IMPLANT
CATH SOFT-VU STRAIGHT 4F 65CM (CATHETERS) ×1
CATH STRAIGHT 5FR 65CM (CATHETERS) ×2 IMPLANT
COVER PRB 48X5XTLSCP FOLD TPE (BAG) ×1 IMPLANT
COVER PROBE 5X48 (BAG) ×1
DEVICE CLOSURE PERCLS PRGLD 6F (VASCULAR PRODUCTS) ×1 IMPLANT
DEVICE TORQUE .025-.038 (MISCELLANEOUS) ×2 IMPLANT
GUIDEWIRE ANGLED .035X150CM (WIRE) ×2 IMPLANT
KIT MICROINTRODUCER STIFF 5F (SHEATH) ×2 IMPLANT
KIT PV (KITS) ×2 IMPLANT
PERCLOSE PROGLIDE 6F (VASCULAR PRODUCTS) ×2
SHEATH PINNACLE 5F 10CM (SHEATH) ×2 IMPLANT
SHEATH PINNACLE 7F 10CM (SHEATH) ×2 IMPLANT
SYR MEDRAD MARK V 150ML (SYRINGE) ×2 IMPLANT
TRANSDUCER W/STOPCOCK (MISCELLANEOUS) ×2 IMPLANT
TRAY PV CATH (CUSTOM PROCEDURE TRAY) ×2 IMPLANT
WIRE HITORQ VERSACORE ST 145CM (WIRE) ×2 IMPLANT

## 2016-07-04 NOTE — Care Management Important Message (Signed)
Important Message  Patient Details  Name: [name] MRN: [id number] Date of Birth: February 15, 1955   Medicare Important Message Given:  Yes    Orbie Pyo 07/04/2016, 1:37 PM

## 2016-07-04 NOTE — Op Note (Signed)
Procedure: Abdominal aortogram with right lower extremity runoff  Preoperative diagnosis: Gangrene right foot postoperative diagnosis: Same  Anesthesia: Local  Operative findings: #1 in-line flow right foot one-vessel runoff anterior tibial dorsalis pedis artery, occluded posterior tibial and peroneal arteries, no significant aortoiliac occlusive disease  Operative details: After obtaining informed consent, the patient was taken to the [facility] lab. The patient was placed in supine position the Angio table. Both groins were prepped and draped in usual sterile fashion. Local anesthesia was infiltrated over the left common femoral artery. Ultrasound was used to identify the left common femoral artery. An introducer needle was used to cannulate the left common femoral artery. This took several attempts and I eventually switched over to a micropuncture needle. I was able to cannulate the artery with this needle and advanced the micropuncture wire and the left iliac system. The micropuncture sheath was placed over this. The dilator was removed from the like picture sheath and an 035 versacore wire threaded up the abdominal aorta under fluoroscopic guidance. Next a 5 French sheath placed over the guidewire and the left common femoral artery. A 5 French pig catheter was then placed over the guidewire into the abdominal aorta and abdominal aortogram was obtained in AP projection. There is diffuse calcification of the abdominal aorta but no flow-limiting lesion. Left and right common external and internal iliac arteries are patent. Left and right oblique views of the pelvis were performed pulling the pigtail catheter down just above the aortic bifurcation due to overlying orthopedic hardware. This again confirmed the above findings. At this point a right lower extremity runoff view was obtained through the pigtail catheter. The right common femoral profunda femoris artery is patent. The right superficial femoral artery is  patent. In the distal portion of the right superficial femoral artery there is a 40% calcified stenosis. Otherwise the artery is widely patent. The popliteal artery is patent. The anterior tibial artery is small but patent all the way to the level of the foot and gives off a dorsalis pedis artery. The digital vessels did not fill well. The peroneal and posterior tibial arteries are occluded in the distal leg. They are very small. At this point the pectoral catheter was removed over guidewire and the right common iliac artery was selectively catheterized with a crossover catheter and an 035 angled Glidewire advanced down into the distal right external iliac artery and a 4 French straight catheter advanced over this into the proximal right external iliac artery. Additional right lower extremity runoff views were performed to confirm the above findings. At this point the 4 French catheter was removed. An oblique view of the left groin showed that the stick site was slightly high at the external iliac common femoral junction. Therefore I decided to attempt to close this with a Proglide device. The patient had some scar tissue from previous thigh graft and some resistance to the Proglide although I was able to advance it over the guidewire. I deployed the Proglide suture but I had difficulty pushing the knot down due to the patient's groin scar tissue. At this point we trimmed the strings back and it still was not very good hemostasis. Hemostasis at this point was obtained with direct pressure over the femoral artery. The patient tolerated procedure well and there were no complications. The patient was taken to the holding area in stable condition.  Operative findings: In-line flow to the right leg with distal occlusions of the peroneal and posterior tibial arteries. However, the anterior  tibial artery and dorsalis pedis artery does fill into the foot. Most likely the patient will need a below-knee amputation as there  are really no suitable endovascular or operative interventions that would improve flow to the right foot.  Ruta Hinds, MD [facility] Office: [phone] Pager: [phone]

## 2016-07-04 NOTE — H&P (View-Only) (Signed)
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 Her only finding 6 months ago was a mild stenosis in the superficial femoral artery. I have recommended a repeat arteriogram to see what has changed. Hopefully she is a candidate for endovascular approach to revascularization. Think she would be a poor candidate for a tibial bypass given the small size of her arteries and multiple medical comorbidities including coronary artery disease, congestive heart failure, and end-stage renal disease.   COUMADIN: Currently on heparin IV as Coumadin is being held in anticipation of arteriogram on Friday.   END-STAGE RENAL DISEASE: Nephrology, please arrange for hemodialysis late Friday (or on TH/Sat) as the patient is scheduled for an arteriogram on Friday morning.   SUBJECTIVE:   Rest pain right foot.  PHYSICAL EXAM:   Vitals:   07/01/16 1034 07/01/16 1729 07/01/16 2140 07/02/16 0410  BP: (!) 138/43 (!) 155/71 (!) 142/52 (!) 98/45  Pulse: 60 66 73 68  Resp: 16 16 14 16   Temp: 98.2 F (36.8 C) 97.5 F (36.4 C) 98.3 F (36.8 C) 98.3 F (36.8 C)  TempSrc: Oral Oral    SpO2: 98%   100%  Weight:   115 lb (52.2 kg)   Height:       No change and dry gangrene involving right great toe and right heel. Palpable femoral pulses.  LABS:   Lab Results  Component Value Date   WBC 9.6 07/02/2016   HGB 10.3 (L) 07/02/2016   HCT 31.5 (L) 07/02/2016   MCV 86.1 07/02/2016   PLT 163 07/02/2016   Lab Results  Component Value Date   CREATININE 4.49 (H) 07/01/2016   Lab Results  Component Value Date   INR 2.98 07/02/2016   CBG (last 3)   Recent Labs  07/01/16 1453 07/01/16 1610 07/01/16 2206  GLUCAP 113* 95 98    PROBLEM LIST:    Principal Problem:   Cellulitis Active Problems:   Hypothyroidism   CAD (coronary artery disease), native coronary  artery   Chronic combined systolic and diastolic congestive heart failure (HCC)   COPD (chronic obstructive pulmonary disease) (HCC)   Chronic anticoagulation w/ coumadin, goal INR 2.5-3.0 w/ AVR   Chronic midline low back pain without sciatica   ESRD on dialysis ([place])   H/O heart valve replacement with mechanical valve   PAD (peripheral artery disease) (HCC)   Diabetic foot infection (HCC)   Malnutrition of moderate degree   CURRENT MEDS:   . aspirin EC  81 mg Oral Daily  . calcitRIOL  0.5 mcg Oral Q M,W,F-HD  . calcium acetate  667 mg Oral TID WC  . feeding supplement  1 Container Oral BID BM  . feeding supplement (PRO-STAT SUGAR FREE 64)  30 mL Oral BID  . insulin aspart  0-5 Units Subcutaneous QHS  . insulin aspart  0-9 Units Subcutaneous TID WC  . levothyroxine  175 mcg Oral QAC breakfast  . metoCLOPramide  5 mg Oral BID AC  . metoprolol tartrate  25 mg Oral BID  . multivitamin  1 tablet Oral QHS  . polyethylene glycol  17 g Oral BID  . pregabalin  75 mg Oral Daily  . senna-docusate  1 tablet Oral Daily  . sodium chloride flush  3 mL Intravenous Q12H  . tacrolimus  2 mg Oral BID    Gae Gallop Beeper: [phone]  Office: [phone] 07/02/2016

## 2016-07-04 NOTE — Interval H&P Note (Signed)
History and Physical Interval Note:  07/04/2016 12:17 PM  [name]  has presented today for surgery, with the diagnosis of grangren  The various methods of treatment have been discussed with the patient and family. After consideration of risks, benefits and other options for treatment, the patient has consented to  Procedure(s): Abdominal Aortogram w/Lower Extremity (N/A) as a surgical intervention .  The patient's history has been reviewed, patient examined, no change in status, stable for surgery.  I have reviewed the patient's chart and labs.  Questions were answered to the patient's satisfaction.     Ruta Hinds

## 2016-07-04 NOTE — Progress Notes (Signed)
Hypoglycemic Event  CBG: 29  Treatment: D50 IV 25 mL  Symptoms: Hungry  Follow-up CBG: Time:21:44 CBG Result:101  Possible Reasons for Event: Inadequate meal intake  Comments/MD notified:Per hypoglycemia protocol    Ugonna Keirsey Joselita

## 2016-07-04 NOTE — Progress Notes (Signed)
Transport in to take pt to HD. Per Dr. Tana Coast, pt to go back to 6E s/p HD. HD nurse notified that pt is on strict bedrest until 1745 and to monitor L groin site frequently.  At time of transfer pt stable, L groin site level 0.

## 2016-07-04 NOTE — Progress Notes (Signed)
PLEASE  NO HEPARIN WITH DIALYSIS TODAY.  HIGH RISK OF BLEEDING POST CATH!  May resume heparin tomorrow if no evidence of bleeding  Dr Scot Dock will review angio to make decision on whether or not pt will require amputation of leg.  Ruta Hinds, MD [facility] Office: [phone] Pager: [phone]

## 2016-07-04 NOTE — Progress Notes (Signed)
Heparin drip stopped at 0715.

## 2016-07-04 NOTE — Progress Notes (Signed)
[facility] Progress Note  Subjective:     Back because of foot issues.  States BKA not healing right.  Objective Vitals:   07/04/16 0800 07/04/16 0829 07/04/16 0930 07/04/16 1000  BP: (!) 139/50 (!) 130/51 (!) 159/55 139/61  Pulse: 64 (!) 58 61 (!) 59  Resp: 12 12 13 15   Temp: 97.5 F (36.4 C) 97.2 F (36.2 C) 97.5 F (36.4 C) 97.4 F (36.3 C)  TempSrc: Oral Oral Oral Oral  SpO2: 98% 99% 100% 100%  Weight:      Height:       Physical Exam General: NAD in bed Heart: RRR + valve click Lungs: no rales Abdomen: soft + BS Extremities:left BKA - with dressing   Right  foot unwrapped; great toe black, heel ulcer Dialysis Access: right IJ   Recent Labs Lab 07/02/16 0858 07/02/16 1430 07/04/16 0425  NA 139 134* 134*  K 3.6 3.1* 3.3*  CL 100* 97* 96*  CO2 27 28 25   GLUCOSE 110* 128* 156*  BUN 22* <5* 16  CREATININE 5.96* 2.50* 5.26*  CALCIUM 8.0* 7.4* 8.8*  PHOS 6.5* 2.8  --      Recent Labs Lab 06/30/16 1845 07/02/16 0858 07/02/16 1430  AST 90*  --   --   ALT 38  --   --   ALKPHOS 243*  --   --   BILITOT 1.0  --   --   PROT 6.8  --   --   ALBUMIN 2.0* 1.7* 1.8*     Recent Labs Lab 06/30/16 1845 07/01/16 0806 07/02/16 0354 07/02/16 0858 07/03/16 0416 07/04/16 0425  WBC 10.1 9.4 9.6 10.6* 10.4 9.4  NEUTROABS 5.4 4.6  --   --   --   --   HGB 11.7* 11.0* 10.3* 10.4* 11.0* 10.7*  HCT 35.1* 33.3* 31.5* 31.2* 33.1* 31.5*  MCV 87.1 87.4 86.1 85.7 86.4 84.7  PLT 172 169 163 173 159 163   Blood Culture    Component Value Date/Time   SDES BLOOD RIGHT HAND 06/30/2016 2247   SPECREQUEST  06/30/2016 2247    BOTTLES DRAWN AEROBIC AND ANAEROBIC Blood Culture adequate volume   CULT NO GROWTH 2 DAYS 06/30/2016 2247   REPTSTATUS PENDING 06/30/2016 2247    Recent Labs Lab 07/03/16 0809 07/03/16 1241 07/03/16 1651 07/03/16 2145 07/04/16 0817  GLUCAP 83 111* 70 75 85   Iron Studies: No results for input(s): IRON, TIBC, TRANSFERRIN,  FERRITIN in the last 72 hours. Lab Results  Component Value Date   INR 2.00 07/04/2016   INR 2.08 07/03/2016   INR 2.34 07/02/2016    Medications: . cefTRIAXone (ROCEPHIN)  IV Stopped (07/03/16 0900)   And  . metronidazole Stopped (07/04/16 0650)   . aspirin EC  81 mg Oral Daily  . calcitRIOL  0.5 mcg Oral Q M,W,F-HD  . calcium acetate  667 mg Oral TID WC  . feeding supplement  1 Container Oral BID BM  . feeding supplement (PRO-STAT SUGAR FREE 64)  30 mL Oral BID  . levothyroxine  175 mcg Oral QAC breakfast  . metoCLOPramide  5 mg Oral BID AC  . metoprolol tartrate  25 mg Oral BID  . multivitamin  1 tablet Oral QHS  . polyethylene glycol  17 g Oral BID  . pregabalin  75 mg Oral Daily  . senna-docusate  1 tablet Oral BID  . 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 Scot Dock. 5/22 for further evaluation and treatment of right foot gangrene secondary to her PAD. Seen by Dr. Scot Dock and planning arteriogram. We were asked to provide HD.   Dialysis Orders:  [facility] MWF - right IJ 3.5h 180F  BFR 450/A1.5x  2K/2Ca  Na Linear  EDW 50 kg No Heparin -Calcitriol 0.5 mcg PO q HD -Mircera 50 mcg IV q 4 weeks   Assessment/Recommendations  1.   PAD/R foot gangrene/recent L BKA (not healed) - empiric abx (Rocephn and flagyl) per primary/VVS following planning arteriogram today  to assess vascular adequacy for healing- would surprised if anything has changed since her last study plus given the fact that left BKA is not healing well does not bode well for her Based on agram result looks like will prob need BKA 2.   ESRD - MWF . HD later today Use 4K bath K 3.3 3.   Anemia - Hgb 10.7; last Mircera dose was 5/9 - off prior to that time; last tsat 24% but no Fe given because ferritin 1200 - which I suspect is inflammatory- start weekly Fe on  5/26 4.   Metabolic bone disease - Cont VDRA/Ca acetate binder  5.   Nutrition - Albumin 2.0 renal diet/vitamins/prostat/Boost  6.   S/p mechanical AVR - holding Coumadin for procedure. Pharmacy managing anticoagulation 7.   H/o liver transplant - on Prograf 8.   DM - per primary    Myriam Jacobson, PA-C [place] [phone] 07/04/2016,10:29 AM  LOS: 4 days   I have seen and examined this patient and agree with plan and assessment in the above note with renal recommendations/intervention highlighted. Based on agram appears likely will need to have another BKA. HD later today.  Mckenna Boruff B,MD 07/04/2016 1:54 PM

## 2016-07-04 NOTE — Progress Notes (Signed)
[facility]                                                                              Patient Demographics  [name], is a 61 y.o. female, DOB - [birth year], GLO:[id number]  Admit date - 06/30/2016   Admitting Physician Vianne Bulls, MD  Outpatient Primary MD for the patient is [name], MD  Outpatient specialists:   LOS - 4  days    Chief Complaint  Patient presents with  . Wound Check       Brief summary   [name] Henkin [name] a 61 y.o.femalewith medical history significant for hypertension, diabetes, coronary artery disease, chronic back pain, depression, end-stage renal disease on HD, mechanical aortic valve on warfarin, and history of liver transplantation, H/o PAD s/p L BKA in 3/18 who presents to the emergency department for evaluation of wounds on the right foot and left BKA stump   Assessment & Plan   Right foot nonhealing ulcers with gangrenous changes with underlying PVD - Underlying risk factors including diabetes, ongoing tobacco abuse, ESRD - ABI right could not be ascertained due to noncompressible vessels in the dorsalis pedis artery, from posterior tibial artery indicates severe reduction in the arterial flow. Left could not be obtained due to BKA - Status post left BKA 3 months ago - Pain better controlled after adding morphine for breakthrough pain.  - Continue Flagyl, ceftriaxone, on IV heparin - Arteriogram planned today by vascular surgery  Active problems Pressure ulcers Right heel: 3cm x 3cm x0.1cm; no drainage  Right great toe: 3cm x 2cm x 0cm:  intact, no drainage Left stump site: 1.0cm x 3.5cm x 0.3; minimal drainage, no odor - Wound care following, Vascular  surgery following  Mechanical AVR - Holding Coumadin for procedure - Continue IV heparin  ESRD on hemodialysis MWF - Nephrology following closely, hemodialysis per schedule  Coronary artery disease - Currently stable, no chest pain, continue aspirin, beta  blocker  Diabetes mellitus with hypoglycemia - Not on any meds, CBG stable  - DC sliding scale  History of liver transplantation - Continue Prograf  Hypertension - Currently stable, continue Lopressor  Code Status:Partial  DVT Prophylaxis: Heparin drip, Coumadin on hold  Family Communication: Discussed in detail with the patient, all imaging results, lab results explained to the patient   Disposition Plan   Time Spent in minutes  25 minutes  Procedures:  ABI Right - ABI could not be ascertained due to non compressible   vessel in the dorsalis pedis artery. The ABI calculated from the   posterior tibial artery indicate s a severe reduction in arterial   flow at res. - Left - ABI cannot be obtained due to below the knee amputation.  Consultants:   Nephrology Vascular surgery  Antimicrobials:   IV ceftriaxone 5/21>  IV Flagyl 5/21>   Medications  Scheduled Meds: . [MAR Hold] aspirin EC  81 mg Oral Daily  . [MAR Hold] calcitRIOL  0.5 mcg Oral Q M,W,F-HD  . [MAR Hold] calcium acetate  667 mg Oral TID WC  . [MAR Hold] feeding supplement  1 Container Oral BID BM  . [  MAR Hold] feeding supplement (PRO-STAT SUGAR FREE 64)  30 mL Oral BID  . [MAR Hold] levothyroxine  175 mcg Oral QAC breakfast  . [MAR Hold] metoCLOPramide  5 mg Oral BID AC  . [MAR Hold] metoprolol tartrate  25 mg Oral BID  . [MAR Hold] multivitamin  1 tablet Oral QHS  . [MAR Hold] polyethylene glycol  17 g Oral BID  . [MAR Hold] pregabalin  75 mg Oral Daily  . [MAR Hold] senna-docusate  1 tablet Oral BID  . [MAR Hold] tacrolimus  2 mg Oral BID   Continuous Infusions: . [MAR Hold] cefTRIAXone (ROCEPHIN)  IV Stopped (07/03/16 0900)   And  . [MAR Hold] metronidazole Stopped (07/04/16 0650)  . [MAR Hold] ferric gluconate (FERRLECIT/NULECIT) IV    . heparin     PRN Meds:.[MAR Hold] acetaminophen **OR** [MAR Hold] acetaminophen, heparin, [MAR Hold] HYDROmorphone, iodixanol, lidocaine (PF), [MAR Hold]  methocarbamol, [MAR Hold]  morphine injection, [MAR Hold] ondansetron **OR** [MAR Hold] ondansetron (ZOFRAN) IV, [MAR Hold] oxyCODONE   Antibiotics   Anti-infectives    Start     Dose/Rate Route Frequency Ordered Stop   07/01/16 0800  [MAR Hold]  cefTRIAXone (ROCEPHIN) 2 g in dextrose 5 % 50 mL IVPB     (MAR Hold since 07/04/16 1218)   2 g 100 mL/hr over 30 Minutes Intravenous Every 24 hours 06/30/16 2027     07/01/16 0800  [MAR Hold]  metroNIDAZOLE (FLAGYL) IVPB 500 mg     (MAR Hold since 07/04/16 1218)   500 mg 100 mL/hr over 60 Minutes Intravenous Every 8 hours 06/30/16 2027     06/30/16 2000  piperacillin-tazobactam (ZOSYN) IVPB 3.375 g  Status:  Discontinued     3.375 g 12.5 mL/hr over 240 Minutes Intravenous Every 12 hours 06/30/16 1925 06/30/16 2027        Subjective:   [name] was seen and examined today. Feeling a lot better with the pain today, controlled, 5/10. Slept better last night.  Patient denies dizziness, chest pain, shortness of breath, abdominal pain, N/V/D/C, new weakness, numbess, tingling.  Objective:   Vitals:   07/04/16 1309 07/04/16 1314 07/04/16 1319 07/04/16 1323  BP: (!) 160/78 (!) 160/74 (!) 154/75 (!) 157/78  Pulse: 81 78 79 84  Resp: 10 11 (!) 21 (!) 0  Temp:      TempSrc:      SpO2: (!) 0% (!) 0% (!) 0% (!) 0%  Weight:      Height:        Intake/Output Summary (Last 24 hours) at 07/04/16 1340 Last data filed at 07/04/16 1100  Gross per 24 hour  Intake             1537 ml  Output                0 ml  Net             1537 ml     Wt Readings from Last 3 Encounters:  07/03/16 53.2 kg (117 lb 4.6 oz)  05/28/16 49.1 kg (108 lb 3.9 oz)  05/20/16 54.4 kg (120 lb)     Exam  General: Alert and oriented 3, NAD  HEENT:  PERRLA, EOMI  Neck: Supple, no JVD  Cardiovascular: S1 and S2 clear, mechanical valve click  Respiratory: CTA B, no wheezing   Gastrointestinal: Soft, non distended, nontender, NBS  Ext: no cyanosis  clubbing. Dry gangrene on the Rt great toe, right heel, no drainage  Neuro:  no new deficits  Skin: Dry gangrene on the right great toe and right heel   Psych: alert and oriented    Data Reviewed:  I have personally reviewed following labs and imaging studies  Micro Results Recent Results (from the past 240 hour(s))  MRSA PCR Screening     Status: Abnormal   Collection Time: 06/30/16 10:15 PM  Result Value Ref Range Status   MRSA by PCR INVALID RESULTS, SPECIMEN SENT FOR CULTURE (A) NEGATIVE Final    Comment: Results Called toChapman Moss RN, AT [phone] 07/01/16 BY D.VANHOOK   MRSA culture     Status: None   Collection Time: 06/30/16 10:15 PM  Result Value Ref Range Status   Specimen Description NASAL SWAB  Final   Special Requests NONE  Final   Culture NO MRSA DETECTED  Final   Report Status 07/03/2016 FINAL  Final  Blood Cultures x 2 sites     Status: None (Preliminary result)   Collection Time: 06/30/16 10:39 PM  Result Value Ref Range Status   Specimen Description BLOOD LEFT HAND  Final   Special Requests   Final    BOTTLES DRAWN AEROBIC AND ANAEROBIC Blood Culture adequate volume   Culture NO GROWTH 2 DAYS  Final   Report Status PENDING  Incomplete  Blood Cultures x 2 sites     Status: None (Preliminary result)   Collection Time: 06/30/16 10:47 PM  Result Value Ref Range Status   Specimen Description BLOOD RIGHT HAND  Final   Special Requests   Final    BOTTLES DRAWN AEROBIC AND ANAEROBIC Blood Culture adequate volume   Culture NO GROWTH 2 DAYS  Final   Report Status PENDING  Incomplete    Radiology Reports Dg Tibia/fibula Left  Result Date: 06/30/2016 CLINICAL DATA:  Amputation wound, check for bony destruction EXAM: LEFT TIBIA AND FIBULA - 2 VIEW COMPARISON:  None. FINDINGS: Changes consistent with a below the knee amputation are seen. Mild osteopenia is noted. No bony erosion to suggest osteomyelitis is seen. IMPRESSION: No evidence of osteomyelitis. Electronically  Signed   By: Inez Catalina M.D.   On: 06/30/2016 18:48   Dg Foot Complete Right  Result Date: 06/30/2016 CLINICAL DATA:  Open wound that will not heal since April. Wounds of the first digit and heel. EXAM: RIGHT FOOT COMPLETE - 3+ VIEW COMPARISON:  None. FINDINGS: The patient's heel wound is subtly visible. No soft tissue emphysema or opaque foreign body. No erosion to suggest osteomyelitis. Osteopenia and diffuse arterially calcification. IMPRESSION: No evidence of osteomyelitis or opaque foreign body. Electronically Signed   By: Monte Fantasia M.D.   On: 06/30/2016 13:41    Lab Data:  CBC:  Recent Labs Lab 06/30/16 1845 07/01/16 2831 07/02/16 0354 07/02/16 0858 07/03/16 0416 07/04/16 0425  WBC 10.1 9.4 9.6 10.6* 10.4 9.4  NEUTROABS 5.4 4.6  --   --   --   --   HGB 11.7* 11.0* 10.3* 10.4* 11.0* 10.7*  HCT 35.1* 33.3* 31.5* 31.2* 33.1* 31.5*  MCV 87.1 87.4 86.1 85.7 86.4 84.7  PLT 172 169 163 173 159 517   Basic Metabolic Panel:  Recent Labs Lab 06/30/16 1845 07/01/16 0806 07/02/16 0858 07/02/16 1430 07/04/16 0425  NA 135 138 139 134* 134*  K 4.0 3.8 3.6 3.1* 3.3*  CL 97* 100* 100* 97* 96*  CO2 28 30 27 28 25   GLUCOSE 127* 91 110* 128* 156*  BUN 11 16 22* <5* 16  CREATININE 3.54* 4.49*  5.96* 2.50* 5.26*  CALCIUM 8.1* 8.4* 8.0* 7.4* 8.8*  PHOS  --   --  6.5* 2.8  --    GFR: Estimated Creatinine Clearance: 9.4 mL/min (A) (by C-G formula based on SCr of 5.26 mg/dL (H)). Liver Function Tests:  Recent Labs Lab 06/30/16 1845 07/02/16 0858 07/02/16 1430  AST 90*  --   --   ALT 38  --   --   ALKPHOS 243*  --   --   BILITOT 1.0  --   --   PROT 6.8  --   --   ALBUMIN 2.0* 1.7* 1.8*   No results for input(s): LIPASE, AMYLASE in the last 168 hours. No results for input(s): AMMONIA in the last 168 hours. Coagulation Profile:  Recent Labs Lab 07/02/16 0354 07/02/16 2038 07/03/16 0416 07/04/16 0425 07/04/16 1054  INR 2.98 2.34 2.08 2.00 1.35   Cardiac  Enzymes: No results for input(s): CKTOTAL, CKMB, CKMBINDEX, TROPONINI in the last 168 hours. BNP (last 3 results) No results for input(s): PROBNP in the last 8760 hours. HbA1C: No results for input(s): HGBA1C in the last 72 hours. CBG:  Recent Labs Lab 07/03/16 0809 07/03/16 1241 07/03/16 1651 07/03/16 2145 07/04/16 0817  GLUCAP 83 111* 70 75 85   Lipid Profile: No results for input(s): CHOL, HDL, LDLCALC, TRIG, CHOLHDL, LDLDIRECT in the last 72 hours. Thyroid Function Tests: No results for input(s): TSH, T4TOTAL, FREET4, T3FREE, THYROIDAB in the last 72 hours. Anemia Panel: No results for input(s): VITAMINB12, FOLATE, FERRITIN, TIBC, IRON, RETICCTPCT in the last 72 hours. Urine analysis:    Component Value Date/Time   COLORURINE YELLOW 11/02/2012 0428   APPEARANCEUR CLOUDY (A) 11/02/2012 0428   LABSPEC 1.011 11/02/2012 0428   PHURINE 8.0 11/02/2012 0428   GLUCOSEU 250 (A) 11/02/2012 0428   HGBUR LARGE (A) 11/02/2012 0428   BILIRUBINUR NEGATIVE 11/02/2012 0428   KETONESUR NEGATIVE 11/02/2012 0428   PROTEINUR >300 (A) 11/02/2012 0428   UROBILINOGEN 0.2 11/02/2012 0428   NITRITE NEGATIVE 11/02/2012 0428   LEUKOCYTESUR NEGATIVE 11/02/2012 0428     Benino Korinek M.D. [facility] 07/04/2016, 1:40 PM  Pager: [phone] Between 7am to 7pm - call Pager - [phone]  After 7pm go to [email or web address] - password [email or web address]  Call night coverage person covering after 7pm

## 2016-07-05 LAB — CBC
HCT: 32.1 % — ABNORMAL LOW (ref 36.0–46.0)
Hemoglobin: 10.9 g/dL — ABNORMAL LOW (ref 12.0–15.0)
MCH: 28.6 pg (ref 26.0–34.0)
MCHC: 34 g/dL (ref 30.0–36.0)
MCV: 84.3 fL (ref 78.0–100.0)
PLATELETS: 135 10*3/uL — AB (ref 150–400)
RBC: 3.81 MIL/uL — ABNORMAL LOW (ref 3.87–5.11)
RDW: 17.7 % — AB (ref 11.5–15.5)
WBC: 11.1 10*3/uL — AB (ref 4.0–10.5)

## 2016-07-05 LAB — BPAM FFP
Blood Product Expiration Date: 201805282359
Blood Product Expiration Date: 201805282359
Blood Product Expiration Date: 201805282359
ISSUE DATE / TIME: 201805250805
ISSUE DATE / TIME: 201805250931
ISSUE DATE / TIME: 201805250932
UNIT TYPE AND RH: 6200
Unit Type and Rh: 600
Unit Type and Rh: 6200

## 2016-07-05 LAB — PREPARE FRESH FROZEN PLASMA
UNIT DIVISION: 0
UNIT DIVISION: 0
Unit division: 0

## 2016-07-05 LAB — HEPATITIS B SURFACE ANTIGEN: HEP B S AG: NEGATIVE

## 2016-07-05 LAB — GLUCOSE, CAPILLARY
GLUCOSE-CAPILLARY: 84 mg/dL (ref 65–99)
GLUCOSE-CAPILLARY: 104 mg/dL — AB (ref 65–99)
Glucose-Capillary: 151 mg/dL — ABNORMAL HIGH (ref 65–99)

## 2016-07-05 LAB — PROTIME-INR
INR: 1.44
PROTHROMBIN TIME: 17.7 s — AB (ref 11.4–15.2)

## 2016-07-05 LAB — HEPARIN LEVEL (UNFRACTIONATED): Heparin Unfractionated: 0.35 IU/mL (ref 0.30–0.70)

## 2016-07-05 MED ORDER — SORBITOL 70 % SOLN
960.0000 mL | TOPICAL_OIL | Freq: Once | ORAL | Status: DC
  Filled 2016-07-05: qty 240

## 2016-07-05 MED ORDER — DARBEPOETIN ALFA 60 MCG/0.3ML IJ SOSY
60.0000 ug | PREFILLED_SYRINGE | INTRAMUSCULAR | Status: DC
  Administered 2016-07-07: 60 ug via INTRAVENOUS
  Filled 2016-07-05: qty 0.3

## 2016-07-05 MED ORDER — HEPARIN (PORCINE) IN NACL 100-0.45 UNIT/ML-% IJ SOLN
950.0000 [IU]/h | INTRAMUSCULAR | Status: DC
  Administered 2016-07-05 – 2016-07-07 (×3): 850 [IU]/h via INTRAVENOUS
  Filled 2016-07-05 (×3): qty 250

## 2016-07-05 MED ORDER — MAGNESIUM CITRATE PO SOLN: 1.0000 | Freq: Once | ORAL | Status: DC

## 2016-07-05 MED ORDER — HEPARIN BOLUS VIA INFUSION
2000.0000 [IU] | Freq: Once | INTRAVENOUS | Status: AC
  Administered 2016-07-05: 2000 [IU] via INTRAVENOUS
  Filled 2016-07-05: qty 2000

## 2016-07-05 MED ORDER — FLEET ENEMA 7-19 GM/118ML RE ENEM
1.0000 | ENEMA | Freq: Once | RECTAL | Status: DC
  Filled 2016-07-05: qty 1

## 2016-07-05 NOTE — Progress Notes (Signed)
Patient had a good big bowel movement this morning post Miralax given early in the morning.

## 2016-07-05 NOTE — Progress Notes (Signed)
Patient ID: [name], female   DOB: [birth year], 61 y.o.   MRN: [id number] Patient is sitting up in the bed. Reports severe pain in her right foot. Has dry gangrenous changes on her right great toe and heel. Explained the only option would be below-knee amputation and she wishes to proceed with this. This will be scheduled with Dr. Scot Dock on Tuesday, 07/08/2016 patient understands.

## 2016-07-05 NOTE — Progress Notes (Signed)
[facility]                                                                              Patient Demographics  [name], is a 61 y.o. female, DOB - 16-Jan-1956, GGE:[id number]  Admit date - 06/30/2016   Admitting Physician Vianne Bulls, MD  Outpatient Primary MD for the patient is [name], MD  Outpatient specialists:   LOS - 5  days    Chief Complaint  Patient presents with  . Wound Check       Brief summary   [name] a 61 y.o.femalewith medical history significant for hypertension, diabetes, coronary artery disease, chronic back pain, depression, end-stage renal disease on HD, mechanical aortic valve on warfarin, and history of liver transplantation, H/o PAD s/p L BKA in 3/18 who presents to the emergency department for evaluation of wounds on the right foot and left BKA stump   Assessment & Plan   Right foot nonhealing ulcers with gangrenous changes with underlying PVD - Underlying risk factors including diabetes, ongoing tobacco abuse, ESRD - ABI right could not be ascertained due to noncompressible vessels in the dorsalis pedis artery, from posterior tibial artery indicates severe reduction in the arterial flow. Left could not be obtained due to BKA - Status post left BKA 3 months ago - Pain better controlled after adding morphine for breakthrough pain.  - Continue Flagyl, ceftriaxone, on IV heparin - Per angiogram by vascular surgery, patient will need a below-knee amputation as there really are no suitable endovascular or operative interventions that would improve flow to the right foot - Patient overwhelmed with the situation as she recently had left BKA. She understands that her pain otherwise net will not improve and she is thinking of agreeing to the [facility] and then subsequently go to the inpatient rehabilitation.  Active problems Pressure ulcers Right heel: 3cm x 3cm x0.1cm; no drainage  Right great toe: 3cm x 2cm x 0cm:   intact, no drainage Left stump site: 1.0cm x 3.5cm x 0.3; minimal drainage, no odor - Wound care following, Vascular  surgery following  Mechanical AVR - Holding Coumadin for procedure - Restart IV heparin with bolus, INR subtherapeutic   ESRD on hemodialysis MWF - Nephrology following closely, hemodialysis per schedule  Coronary artery disease - Currently stable, no chest pain, continue aspirin, beta blocker  Diabetes mellitus with hypoglycemia - Not on any meds, CBG stable  - DC sliding scale  History of liver transplantation - Continue Prograf  Hypertension - Currently stable, continue Lopressor  Code Status:Partial  DVT Prophylaxis: Heparin drip, Coumadin on hold  Family Communication: Discussed in detail with the patient, all imaging results, lab results explained to the patient   Disposition Plan   Time Spent in minutes  25 minutes  Procedures:  ABI Right - ABI could not be ascertained due to non compressible   vessel in the dorsalis pedis artery. The ABI calculated from the   posterior tibial artery indicate s a severe reduction in arterial   flow at res. - Left - ABI cannot be obtained due to below the knee amputation.  07/04/16 Procedure: Abdominal aortogram with right lower extremity runoff  Consultants:   Nephrology Vascular surgery  Antimicrobials:   IV ceftriaxone 5/21>  IV Flagyl 5/21>   Medications  Scheduled Meds: . aspirin EC  81 mg Oral Daily  . calcitRIOL  0.5 mcg Oral Q M,W,F-HD  . calcium acetate  667 mg Oral TID WC  . [START ON 07/07/2016] darbepoetin (ARANESP) injection - DIALYSIS  60 mcg Intravenous Q Mon-HD  . docusate sodium  100 mg Oral Daily  . feeding supplement  1 Container Oral BID BM  . feeding supplement (PRO-STAT SUGAR FREE 64)  30 mL Oral BID  . levothyroxine  175 mcg Oral QAC breakfast  . metoCLOPramide  5 mg Oral BID AC  . metoprolol tartrate  25 mg Oral BID  . multivitamin  1 tablet Oral QHS  . polyethylene glycol   17 g Oral BID  . pregabalin  75 mg Oral Daily  . senna-docusate  1 tablet Oral BID  . tacrolimus  2 mg Oral BID   Continuous Infusions: . sodium chloride    . sodium chloride    . cefTRIAXone (ROCEPHIN)  IV 2 g (07/05/16 1023)   And  . metronidazole Stopped (07/05/16 0530)  . ferric gluconate (FERRLECIT/NULECIT) IV Stopped (07/05/16 1214)  . heparin 850 Units/hr (07/05/16 1225)   PRN Meds:.sodium chloride, sodium chloride, acetaminophen **OR** acetaminophen, alteplase, gi cocktail, heparin, hydrALAZINE, HYDROmorphone, labetalol, methocarbamol, metoprolol tartrate, morphine injection, ondansetron **OR** ondansetron (ZOFRAN) IV, oxyCODONE   Antibiotics   Anti-infectives    Start     Dose/Rate Route Frequency Ordered Stop   07/01/16 0800  cefTRIAXone (ROCEPHIN) 2 g in dextrose 5 % 50 mL IVPB     2 g 100 mL/hr over 30 Minutes Intravenous Every 24 hours 06/30/16 2027     07/01/16 0800  metroNIDAZOLE (FLAGYL) IVPB 500 mg     500 mg 100 mL/hr over 60 Minutes Intravenous Every 8 hours 06/30/16 2027     06/30/16 2000  piperacillin-tazobactam (ZOSYN) IVPB 3.375 g  Status:  Discontinued     3.375 g 12.5 mL/hr over 240 Minutes Intravenous Every 12 hours 06/30/16 1925 06/30/16 2027        Subjective:   Truly Stankiewicz was seen and examined today. Very overwhelmed with the situation, will need another BKA on the right, tearful. Constipated. Patient denies dizziness, chest pain, shortness of breath, abdominal pain, new weakness, numbess, tingling.  Objective:   Vitals:   07/04/16 1900 07/04/16 1933 07/04/16 2053 07/05/16 0500  BP: 95/72 (!) 126/56 129/82 (!) 130/51  Pulse: 73 80 80 73  Resp:  18 18 18   Temp:  98.6 F (37 C) 98.3 F (36.8 C) 98.2 F (36.8 C)  TempSrc:  Oral Oral Oral  SpO2:  100% 100% 98%  Weight:  54 kg (119 lb 0.8 oz) 52.4 kg (115 lb 8.3 oz)   Height:        Intake/Output Summary (Last 24 hours) at 07/05/16 1424 Last data filed at 07/05/16 0502  Gross per 24  hour  Intake              370 ml  Output             1008 ml  Net             -638 ml     Wt Readings from Last 3 Encounters:  07/04/16 52.4 kg (115 lb 8.3 oz)  05/28/16 49.1 kg (108 lb 3.9 oz)  05/20/16  54.4 kg (120 lb)     Exam  General: Alert and oriented 3, Tearful  HEENT:  PERRLA, EOMI  Neck: Supple, no JVD  Cardiovascular: S1 and S2 clear, mechanical valve click  Respiratory: Clear to auscultation bilaterally  Gastrointestinal: Soft, NT, ND, NBS  Ext: no cyanosis clubbing. Right great toe, right heel dry gangrene    Neuro: no new deficits  Skin: Right great toe and heel dry gangrene  Psych: alert and oriented    Data Reviewed:  I have personally reviewed following labs and imaging studies  Micro Results Recent Results (from the past 240 hour(s))  MRSA PCR Screening     Status: Abnormal   Collection Time: 06/30/16 10:15 PM  Result Value Ref Range Status   MRSA by PCR INVALID RESULTS, SPECIMEN SENT FOR CULTURE (A) NEGATIVE Final    Comment: Results Called toChapman Moss RN, AT [phone] 07/01/16 BY D.VANHOOK   MRSA culture     Status: None   Collection Time: 06/30/16 10:15 PM  Result Value Ref Range Status   Specimen Description NASAL SWAB  Final   Special Requests NONE  Final   Culture NO MRSA DETECTED  Final   Report Status 07/03/2016 FINAL  Final  Blood Cultures x 2 sites     Status: None (Preliminary result)   Collection Time: 06/30/16 10:39 PM  Result Value Ref Range Status   Specimen Description BLOOD LEFT HAND  Final   Special Requests   Final    BOTTLES DRAWN AEROBIC AND ANAEROBIC Blood Culture adequate volume   Culture NO GROWTH 4 DAYS  Final   Report Status PENDING  Incomplete  Blood Cultures x 2 sites     Status: None (Preliminary result)   Collection Time: 06/30/16 10:47 PM  Result Value Ref Range Status   Specimen Description BLOOD RIGHT HAND  Final   Special Requests   Final    BOTTLES DRAWN AEROBIC AND ANAEROBIC Blood Culture adequate  volume   Culture NO GROWTH 4 DAYS  Final   Report Status PENDING  Incomplete    Radiology Reports Dg Tibia/fibula Left  Result Date: 06/30/2016 CLINICAL DATA:  Amputation wound, check for bony destruction EXAM: LEFT TIBIA AND FIBULA - 2 VIEW COMPARISON:  None. FINDINGS: Changes consistent with a below the knee amputation are seen. Mild osteopenia is noted. No bony erosion to suggest osteomyelitis is seen. IMPRESSION: No evidence of osteomyelitis. Electronically Signed   By: Inez Catalina M.D.   On: 06/30/2016 18:48   Dg Foot Complete Right  Result Date: 06/30/2016 CLINICAL DATA:  Open wound that will not heal since April. Wounds of the first digit and heel. EXAM: RIGHT FOOT COMPLETE - 3+ VIEW COMPARISON:  None. FINDINGS: The patient's heel wound is subtly visible. No soft tissue emphysema or opaque foreign body. No erosion to suggest osteomyelitis. Osteopenia and diffuse arterially calcification. IMPRESSION: No evidence of osteomyelitis or opaque foreign body. Electronically Signed   By: Monte Fantasia M.D.   On: 06/30/2016 13:41    Lab Data:  CBC:  Recent Labs Lab 06/30/16 1845 07/01/16 6269 07/02/16 0354 07/02/16 4854 07/03/16 0416 07/04/16 0425 07/05/16 0433  WBC 10.1 9.4 9.6 10.6* 10.4 9.4 11.1*  NEUTROABS 5.4 4.6  --   --   --   --   --   HGB 11.7* 11.0* 10.3* 10.4* 11.0* 10.7* 10.9*  HCT 35.1* 33.3* 31.5* 31.2* 33.1* 31.5* 32.1*  MCV 87.1 87.4 86.1 85.7 86.4 84.7 84.3  PLT 172 169 163  173 159 163 416*   Basic Metabolic Panel:  Recent Labs Lab 06/30/16 1845 07/01/16 0806 07/02/16 0858 07/02/16 1430 07/04/16 0425  NA 135 138 139 134* 134*  K 4.0 3.8 3.6 3.1* 3.3*  CL 97* 100* 100* 97* 96*  CO2 28 30 27 28 25   GLUCOSE 127* 91 110* 128* 156*  BUN 11 16 22* <5* 16  CREATININE 3.54* 4.49* 5.96* 2.50* 5.26*  CALCIUM 8.1* 8.4* 8.0* 7.4* 8.8*  PHOS  --   --  6.5* 2.8  --    GFR: Estimated Creatinine Clearance: 9.3 mL/min (A) (by C-G formula based on SCr of 5.26  mg/dL (H)). Liver Function Tests:  Recent Labs Lab 06/30/16 1845 07/02/16 0858 07/02/16 1430  AST 90*  --   --   ALT 38  --   --   ALKPHOS 243*  --   --   BILITOT 1.0  --   --   PROT 6.8  --   --   ALBUMIN 2.0* 1.7* 1.8*   No results for input(s): LIPASE, AMYLASE in the last 168 hours. No results for input(s): AMMONIA in the last 168 hours. Coagulation Profile:  Recent Labs Lab 07/02/16 2038 07/03/16 0416 07/04/16 0425 07/04/16 1054 07/05/16 0433  INR 2.34 2.08 2.00 1.35 1.44   Cardiac Enzymes: No results for input(s): CKTOTAL, CKMB, CKMBINDEX, TROPONINI in the last 168 hours. BNP (last 3 results) No results for input(s): PROBNP in the last 8760 hours. HbA1C: No results for input(s): HGBA1C in the last 72 hours. CBG:  Recent Labs Lab 07/04/16 1530 07/04/16 2050 07/04/16 2144 07/05/16 0822 07/05/16 1156  GLUCAP 60* 29* 101* 84 104*   Lipid Profile: No results for input(s): CHOL, HDL, LDLCALC, TRIG, CHOLHDL, LDLDIRECT in the last 72 hours. Thyroid Function Tests: No results for input(s): TSH, T4TOTAL, FREET4, T3FREE, THYROIDAB in the last 72 hours. Anemia Panel: No results for input(s): VITAMINB12, FOLATE, FERRITIN, TIBC, IRON, RETICCTPCT in the last 72 hours. Urine analysis:    Component Value Date/Time   COLORURINE YELLOW 11/02/2012 0428   APPEARANCEUR CLOUDY (A) 11/02/2012 0428   LABSPEC 1.011 11/02/2012 0428   PHURINE 8.0 11/02/2012 0428   GLUCOSEU 250 (A) 11/02/2012 0428   HGBUR LARGE (A) 11/02/2012 0428   BILIRUBINUR NEGATIVE 11/02/2012 0428   KETONESUR NEGATIVE 11/02/2012 0428   PROTEINUR >300 (A) 11/02/2012 0428   UROBILINOGEN 0.2 11/02/2012 0428   NITRITE NEGATIVE 11/02/2012 0428   LEUKOCYTESUR NEGATIVE 11/02/2012 0428     Jaeanna Mccomber M.D. [facility] 07/05/2016, 2:24 PM  Pager: [phone] Between 7am to 7pm - call Pager - [phone]  After 7pm go to [email or web address] - password [email or web address]  Call night coverage person covering after  7pm

## 2016-07-05 NOTE — Progress Notes (Signed)
[facility] Progress Note  Subjective:    No problems with dialysis yesterday.  Needs to have BM.  Family wants her to have a BKA.   She just wants pain to go away so will probably agree to BKA.  Objective Vitals:   07/04/16 1900 07/04/16 1933 07/04/16 2053 07/05/16 0500  BP: 95/72 (!) 126/56 129/82 (!) 130/51  Pulse: 73 80 80 73  Resp:  18 18 18   Temp:  98.6 F (37 C) 98.3 F (36.8 C) 98.2 F (36.8 C)  TempSrc:  Oral Oral Oral  SpO2:  100% 100% 98%  Weight:  54 kg (119 lb 0.8 oz) 52.4 kg (115 lb 8.3 oz)   Height:       Physical Exam General: NAD Heart: RRR + valve click Lungs: no rales Abdomen: soft + S Extremities: left BKA with dressing; right foot great toe - black and dry heel ulcer Dialysis Access: right IJ   Additional Objective Labs: Lab Results  Component Value Date   INR 1.44 07/05/2016   INR 1.35 07/04/2016   INR 2.00 07/04/2016    Recent Labs Lab 07/02/16 0858 07/02/16 1430 07/04/16 0425  NA 139 134* 134*  K 3.6 3.1* 3.3*  CL 100* 97* 96*  CO2 27 28 25   GLUCOSE 110* 128* 156*  BUN 22* <5* 16  CREATININE 5.96* 2.50* 5.26*  CALCIUM 8.0* 7.4* 8.8*  PHOS 6.5* 2.8  --      Recent Labs Lab 06/30/16 1845 07/02/16 0858 07/02/16 1430  AST 90*  --   --   ALT 38  --   --   ALKPHOS 243*  --   --   BILITOT 1.0  --   --   PROT 6.8  --   --   ALBUMIN 2.0* 1.7* 1.8*     Recent Labs Lab 06/30/16 1845 07/01/16 0806 07/02/16 0354 07/02/16 0858 07/03/16 0416 07/04/16 0425 07/05/16 0433  WBC 10.1 9.4 9.6 10.6* 10.4 9.4 11.1*  NEUTROABS 5.4 4.6  --   --   --   --   --   HGB 11.7* 11.0* 10.3* 10.4* 11.0* 10.7* 10.9*  HCT 35.1* 33.3* 31.5* 31.2* 33.1* 31.5* 32.1*  MCV 87.1 87.4 86.1 85.7 86.4 84.7 84.3  PLT 172 169 163 173 159 163 135*   Blood Culture    Component Value Date/Time   SDES BLOOD RIGHT HAND 06/30/2016 2247   SPECREQUEST  06/30/2016 2247    BOTTLES DRAWN AEROBIC AND ANAEROBIC Blood Culture adequate volume    CULT NO GROWTH 3 DAYS 06/30/2016 2247   REPTSTATUS PENDING 06/30/2016 2247     Recent Labs Lab 07/04/16 1432 07/04/16 1530 07/04/16 2050 07/04/16 2144 07/05/16 0822  GLUCAP 53* 60* 29* 101* 84   Medications: . sodium chloride    . sodium chloride    . cefTRIAXone (ROCEPHIN)  IV Stopped (07/03/16 0900)   And  . metronidazole Stopped (07/05/16 0530)  . ferric gluconate (FERRLECIT/NULECIT) IV     . aspirin EC  81 mg Oral Daily  . calcitRIOL  0.5 mcg Oral Q M,W,F-HD  . calcium acetate  667 mg Oral TID WC  . docusate sodium  100 mg Oral Daily  . feeding supplement  1 Container Oral BID BM  . feeding supplement (PRO-STAT SUGAR FREE 64)  30 mL Oral BID  . levothyroxine  175 mcg Oral QAC breakfast  . metoCLOPramide  5 mg Oral BID AC  . metoprolol tartrate  25 mg Oral BID  .  multivitamin  1 tablet Oral QHS  . polyethylene glycol  17 g Oral BID  . pregabalin  75 mg Oral Daily  . senna-docusate  1 tablet Oral BID  . sodium phosphate  1 enema Rectal Once  . tacrolimus  2 mg Oral BID   Background: 61 y.o.femalewith ESRD on HD, HTN, DM, CAD, chronic back pain, 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 Scot Dock. 5/22 for further evaluation and treatment of right foot gangrene secondary to her PAD. Seen by Dr. Scot Dock and planning arteriogram. We were asked to provide HD.   Dialysis Orders:  [facility] MWF - right IJ 3.5h 180F  BFR 450/A1.5x  2K/2Ca  Na Linear  EDW 50 kg No Heparin -Calcitriol 0.5 mcg PO q HD -Mircera 50 mcg IV q 4 weeks   Assessment/Recommendations 1.   PAD/R foot gangrene/recent L BKA (not healed) - empiric abx (Rocephn and flagyl)  Per angio results, likely will have BKA. 2.   ESRD - MWF . Next HD Monday 3.   Anemia - Hgb 10.9; last Mircera dose was 5/9 - off prior to that time; last tsat 24% but no Fe given because ferritin 1200 - which I suspect is inflammatory- start weekly Fe on 5/26; redose ESA next week -  hgb will drop with surgery 4.   Metabolic bone disease - Cont VDRA/Ca acetate binder  5.   Nutrition - Albumin 2.0 renal diet/vitamins/prostat/Boost - on regular diet to promote intake 6.   S/p mechanical AVR -  INR 1.44; need to resume heparin discussed with primary 7.   H/o liver transplant - on Prograf 8.   DM - per primary   Myriam Jacobson, PA-C [place] [phone] 07/05/2016,10:02 AM  LOS: 5 days   I have seen and examined this patient and agree with plan and assessment in the above note with renal recommendations/intervention highlighted. Waiting on final recommendation from [facility] but anticipate BKA and she would agree with proceeding.   Lamon Rotundo B,MD 07/05/2016 12:57 PM

## 2016-07-05 NOTE — Progress Notes (Signed)
[place] for heparin Indication: mechanical AVR   Allergies  Allergen Reactions  . Acetaminophen Other (See Comments)    Liver transplant recipient   . Codeine Itching  . Mirtazapine Other (See Comments)    hallucination    Patient Measurements: Height: 5\' 4"  (162.6 cm) Weight: 115 lb 8.3 oz (52.4 kg) IBW/kg (Calculated) : 54.7 Heparin Dosing Weight: 52.6 kg  Vital Signs: Temp: 98.3 F (36.8 C) (05/26 1730) Temp Source: Oral (05/26 1730) BP: 110/59 (05/26 1730) Pulse Rate: 59 (05/26 1730)  Labs:  Recent Labs  07/03/16 0416 07/04/16 0425 07/04/16 1054 07/05/16 0433 07/05/16 1922  HGB 11.0* 10.7*  --  10.9*  --   HCT 33.1* 31.5*  --  32.1*  --   PLT 159 163  --  135*  --   LABPROT 23.7* 23.0* 16.8* 17.7*  --   INR 2.08 2.00 1.35 1.44  --   HEPARINUNFRC 0.37 0.29*  --   --  0.35  CREATININE  --  5.26*  --   --   --     Estimated Creatinine Clearance: 9.3 mL/min (A) (by C-G formula based on SCr of 5.26 mg/dL (H)).   Medications:  Infusions:  . sodium chloride    . sodium chloride    . cefTRIAXone (ROCEPHIN)  IV Stopped (07/05/16 1053)   And  . metronidazole Stopped (07/05/16 1537)  . ferric gluconate (FERRLECIT/NULECIT) IV Stopped (07/05/16 1214)  . heparin 850 Units/hr (07/05/16 1225)    Assessment: 61yo F with mechanical AVR on warfarin PTA which is being held for possible BKA. Pharmacy consulted to restart heparin- was held for arteriogram 5/25 AM. CBC low stable, no bleeding noted. Heparin level is at goal.   Goal of Therapy:  Heparin level 0.3-0.7 units/ml Monitor platelets by anticoagulation protocol: Yes   Plan:  Continue heparin drip at 850 units/hr Daily heparin level and CBC F/u resuming warfarin as able  Salome Arnt, PharmD, BCPS Pager # [phone] 07/05/2016 8:26 PM

## 2016-07-05 NOTE — Progress Notes (Signed)
[place] for heparin Indication: mechanical AVR   Allergies  Allergen Reactions  . Acetaminophen Other (See Comments)    Liver transplant recipient   . Codeine Itching  . Mirtazapine Other (See Comments)    hallucination    Patient Measurements: Height: 5\' 4"  (162.6 cm) Weight: 115 lb 8.3 oz (52.4 kg) IBW/kg (Calculated) : 54.7 Heparin Dosing Weight: 52.6 kg  Vital Signs: Temp: 98.2 F (36.8 C) (05/26 0500) Temp Source: Oral (05/26 0500) BP: 130/51 (05/26 0500) Pulse Rate: 73 (05/26 0500)  Labs:  Recent Labs  07/02/16 1430  07/02/16 2038  07/03/16 0416 07/04/16 0425 07/04/16 1054 07/05/16 0433  HGB  --   --   --   < > 11.0* 10.7*  --  10.9*  HCT  --   --   --   --  33.1* 31.5*  --  32.1*  PLT  --   --   --   --  159 163  --  135*  LABPROT  --   < > 26.0*  --  23.7* 23.0* 16.8* 17.7*  INR  --   < > 2.34  --  2.08 2.00 1.35 1.44  HEPARINUNFRC 0.49  --  0.50  --  0.37 0.29*  --   --   CREATININE 2.50*  --   --   --   --  5.26*  --   --   < > = values in this interval not displayed.  Estimated Creatinine Clearance: 9.3 mL/min (A) (by C-G formula based on SCr of 5.26 mg/dL (H)).   Medications:  Infusions:  . sodium chloride    . sodium chloride    . cefTRIAXone (ROCEPHIN)  IV 2 g (07/05/16 1023)   And  . metronidazole Stopped (07/05/16 0530)  . ferric gluconate (FERRLECIT/NULECIT) IV    . heparin      Assessment: 61yo F with mechanical AVR on warfarin PTA which is being held for possible BKA. Pharmacy consulted to restart heparin- was held for arteriogram 5/25 AM. CBC low stable, no bleeding ntoed  Goal of Therapy:  Heparin level 0.3-0.7 units/ml Monitor platelets by anticoagulation protocol: Yes   Plan:  - Bolus 2000 units - Restart heparin drip at 850 units/hr - Obtain heparin level in 8 hours - Daily heparin level and CBC - Monitor for s/sx of bleeding - F/u resuming warfarin as able   Gwenlyn Perking,  PharmD PGY1 Pharmacy Resident [phone] 07/05/2016 10:38 AM

## 2016-07-06 LAB — CULTURE, BLOOD (ROUTINE X 2)
Culture: NO GROWTH
Culture: NO GROWTH
Special Requests: ADEQUATE
Special Requests: ADEQUATE

## 2016-07-06 LAB — CBC
HEMATOCRIT: 27.5 % — AB (ref 36.0–46.0)
HEMOGLOBIN: 9.7 g/dL — AB (ref 12.0–15.0)
MCH: 29.2 pg (ref 26.0–34.0)
MCHC: 35.3 g/dL (ref 30.0–36.0)
MCV: 82.8 fL (ref 78.0–100.0)
Platelets: 128 10*3/uL — ABNORMAL LOW (ref 150–400)
RBC: 3.32 MIL/uL — AB (ref 3.87–5.11)
RDW: 18 % — ABNORMAL HIGH (ref 11.5–15.5)
WBC: 12.6 10*3/uL — ABNORMAL HIGH (ref 4.0–10.5)

## 2016-07-06 LAB — PROTIME-INR
INR: 1.62
Prothrombin Time: 19.5 seconds — ABNORMAL HIGH (ref 11.4–15.2)

## 2016-07-06 LAB — GLUCOSE, CAPILLARY
GLUCOSE-CAPILLARY: 64 mg/dL — AB (ref 65–99)
GLUCOSE-CAPILLARY: 32 mg/dL — AB (ref 65–99)
GLUCOSE-CAPILLARY: 80 mg/dL (ref 65–99)
GLUCOSE-CAPILLARY: 210 mg/dL — AB (ref 65–99)
Glucose-Capillary: 173 mg/dL — ABNORMAL HIGH (ref 65–99)
Glucose-Capillary: 168 mg/dL — ABNORMAL HIGH (ref 65–99)

## 2016-07-06 LAB — HEPARIN LEVEL (UNFRACTIONATED): Heparin Unfractionated: 0.34 IU/mL (ref 0.30–0.70)

## 2016-07-06 MED ORDER — DEXTROSE 50 % IV SOLN
INTRAVENOUS | Status: AC
  Administered 2016-07-06: 50 mL via INTRAVENOUS
  Filled 2016-07-06: qty 50

## 2016-07-06 NOTE — Progress Notes (Signed)
[facility]                                                                              Patient Demographics  [name], is a 61 y.o. female, DOB - [birth year], TOI:[id number]  Admit date - 06/30/2016   Admitting Physician Vianne Bulls, MD  Outpatient Primary MD for the patient is [name], MD  Outpatient specialists:   LOS - 6  days    Chief Complaint  Patient presents with  . Wound Check       Brief summary   [name] Deady [name] a 61 y.o.femalewith medical history significant for hypertension, diabetes, coronary artery disease, chronic back pain, depression, end-stage renal disease on HD, mechanical aortic valve on warfarin, and history of liver transplantation, H/o PAD s/p L BKA in 3/18 who presents to the emergency department for evaluation of wounds on the right foot and left BKA stump   Assessment & Plan   Right foot nonhealing ulcers with gangrenous changes with underlying PVD - Underlying risk factors including diabetes, ongoing tobacco abuse, ESRD - ABI right could not be ascertained due to noncompressible vessels in the dorsalis pedis artery, from posterior tibial artery indicates severe reduction in the arterial flow. Left could not be obtained due to BKA - Status post left BKA 3 months ago - Continue Flagyl, ceftriaxone, on IV heparin - Per angiogram by vascular surgery, patient will need a below-knee amputation as there really are no suitable endovascular or operative interventions that would improve flow to the right foot - Plan for right BKA on Tuesday 5/29 by vascular surgery  Active problems Pressure ulcers Right heel: 3cm x 3cm x0.1cm; no drainage  Right great toe: 3cm x 2cm x 0cm:  intact, no drainage Left stump site: 1.0cm x 3.5cm x 0.3; minimal drainage, no odor - Wound care following, Vascular surgery following, plan for right BKA on 5/29  Mechanical AVR - Holding Coumadin for procedure - Restart IV heparin with bolus, INR  subtherapeutic   ESRD on hemodialysis MWF - Nephrology following closely, hemodialysis per schedule  Coronary artery disease - Currently stable, no chest pain, continue aspirin, beta blocker  Diabetes mellitus with hypoglycemia - Not on any meds, poor oral intake - Not on any sliding scale   History of liver transplantation - Continue Prograf  Hypertension - Currently stable, continue Lopressor  Constipation Resolved  Code Status:Partial  DVT Prophylaxis: Heparin drip, Coumadin on hold  Family Communication: Discussed in detail with the patient, all imaging results, lab results explained to the patient   Disposition Plan  plan for surgery on 5/29, subsequently patient wants to go to inpatient rehabilitation   Time Spent in minutes 15 minutes  Procedures:  ABI Right - ABI could not be ascertained due to non compressible   vessel in the dorsalis pedis artery. The ABI calculated from the   posterior tibial artery indicate s a severe reduction in arterial   flow at res. - Left - ABI cannot be obtained due to below the knee amputation.   07/04/16 Procedure: Abdominal aortogram with right lower extremity runoff  Consultants:   Nephrology Vascular  surgery  Antimicrobials:   IV ceftriaxone 5/21>  IV Flagyl 5/21>   Medications  Scheduled Meds: . aspirin EC  81 mg Oral Daily  . calcitRIOL  0.5 mcg Oral Q M,W,F-HD  . calcium acetate  667 mg Oral TID WC  . [START ON 07/07/2016] darbepoetin (ARANESP) injection - DIALYSIS  60 mcg Intravenous Q Mon-HD  . docusate sodium  100 mg Oral Daily  . feeding supplement  1 Container Oral BID BM  . feeding supplement (PRO-STAT SUGAR FREE 64)  30 mL Oral BID  . levothyroxine  175 mcg Oral QAC breakfast  . metoCLOPramide  5 mg Oral BID AC  . metoprolol tartrate  25 mg Oral BID  . multivitamin  1 tablet Oral QHS  . polyethylene glycol  17 g Oral BID  . pregabalin  75 mg Oral Daily  . senna-docusate  1 tablet Oral BID  . tacrolimus   2 mg Oral BID   Continuous Infusions: . sodium chloride    . sodium chloride    . cefTRIAXone (ROCEPHIN)  IV Stopped (07/06/16 0354)   And  . metronidazole 500 mg (07/06/16 1317)  . ferric gluconate (FERRLECIT/NULECIT) IV Stopped (07/05/16 1214)  . heparin 850 Units/hr (07/05/16 1225)   PRN Meds:.sodium chloride, sodium chloride, acetaminophen **OR** acetaminophen, hydrALAZINE, HYDROmorphone, labetalol, methocarbamol, metoprolol tartrate, morphine injection, ondansetron **OR** ondansetron (ZOFRAN) IV, oxyCODONE   Antibiotics   Anti-infectives    Start     Dose/Rate Route Frequency Ordered Stop   07/01/16 0800  cefTRIAXone (ROCEPHIN) 2 g in dextrose 5 % 50 mL IVPB     2 g 100 mL/hr over 30 Minutes Intravenous Every 24 hours 06/30/16 2027     07/01/16 0800  metroNIDAZOLE (FLAGYL) IVPB 500 mg     500 mg 100 mL/hr over 60 Minutes Intravenous Every 8 hours 06/30/16 2027     06/30/16 2000  piperacillin-tazobactam (ZOSYN) IVPB 3.375 g  Status:  Discontinued     3.375 g 12.5 mL/hr over 240 Minutes Intravenous Every 12 hours 06/30/16 1925 06/30/16 2027        Subjective:   [name] was seen and examined today. Denies any specific complaints today. Feels a lot better today after having bowel movement yesterday. Patient denies dizziness, chest pain, shortness of breath, abdominal pain, new weakness, numbess, tingling.  Objective:   Vitals:   07/05/16 1730 07/05/16 2042 07/06/16 0437 07/06/16 1011  BP: (!) 110/59 (!) 130/54 (!) 110/51 (!) 143/50  Pulse: (!) 59 75 73 62  Resp: 14 16 16 18   Temp: 98.3 F (36.8 C) 98.1 F (36.7 C) 98.2 F (36.8 C) 97.5 F (36.4 C)  TempSrc: Oral Oral Oral Oral  SpO2: 100% 100% 99% 97%  Weight:  53 kg (116 lb 14.4 oz)    Height:        Intake/Output Summary (Last 24 hours) at 07/06/16 1354 Last data filed at 07/06/16 0900  Gross per 24 hour  Intake           804.46 ml  Output                0 ml  Net           804.46 ml     Wt  Readings from Last 3 Encounters:  07/05/16 53 kg (116 lb 14.4 oz)  05/28/16 49.1 kg (108 lb 3.9 oz)  05/20/16 54.4 kg (120 lb)     Exam  General: Alert and oriented 3, NAD   HEENT:  PERRLA, EOMI  Neck: Supple, no JVD  Cardiovascular: S1 and S2 clear, mechanical valve click  Respiratory: CTA B  Gastrointestinal: Soft, NT, ND, NBS  Ext: no cyanosis clubbing. Right great toe, right heel dry gangrene    Neuro: no new deficits  Skin: Right great toe and heel dry gangrene  Psych:  alert and oriented 3, normal affect    Data Reviewed:  I have personally reviewed following labs and imaging studies  Micro Results Recent Results (from the past 240 hour(s))  MRSA PCR Screening     Status: Abnormal   Collection Time: 06/30/16 10:15 PM  Result Value Ref Range Status   MRSA by PCR INVALID RESULTS, SPECIMEN SENT FOR CULTURE (A) NEGATIVE Final    Comment: Results Called toChapman Moss RN, AT [phone] 07/01/16 BY D.VANHOOK   MRSA culture     Status: None   Collection Time: 06/30/16 10:15 PM  Result Value Ref Range Status   Specimen Description NASAL SWAB  Final   Special Requests NONE  Final   Culture NO MRSA DETECTED  Final   Report Status 07/03/2016 FINAL  Final  Blood Cultures x 2 sites     Status: None   Collection Time: 06/30/16 10:39 PM  Result Value Ref Range Status   Specimen Description BLOOD LEFT HAND  Final   Special Requests   Final    BOTTLES DRAWN AEROBIC AND ANAEROBIC Blood Culture adequate volume   Culture NO GROWTH 5 DAYS  Final   Report Status 07/06/2016 FINAL  Final  Blood Cultures x 2 sites     Status: None   Collection Time: 06/30/16 10:47 PM  Result Value Ref Range Status   Specimen Description BLOOD RIGHT HAND  Final   Special Requests   Final    BOTTLES DRAWN AEROBIC AND ANAEROBIC Blood Culture adequate volume   Culture NO GROWTH 5 DAYS  Final   Report Status 07/06/2016 FINAL  Final    Radiology Reports Dg Tibia/fibula Left  Result Date:  06/30/2016 CLINICAL DATA:  Amputation wound, check for bony destruction EXAM: LEFT TIBIA AND FIBULA - 2 VIEW COMPARISON:  None. FINDINGS: Changes consistent with a below the knee amputation are seen. Mild osteopenia is noted. No bony erosion to suggest osteomyelitis is seen. IMPRESSION: No evidence of osteomyelitis. Electronically Signed   By: Inez Catalina M.D.   On: 06/30/2016 18:48   Dg Foot Complete Right  Result Date: 06/30/2016 CLINICAL DATA:  Open wound that will not heal since April. Wounds of the first digit and heel. EXAM: RIGHT FOOT COMPLETE - 3+ VIEW COMPARISON:  None. FINDINGS: The patient's heel wound is subtly visible. No soft tissue emphysema or opaque foreign body. No erosion to suggest osteomyelitis. Osteopenia and diffuse arterially calcification. IMPRESSION: No evidence of osteomyelitis or opaque foreign body. Electronically Signed   By: Monte Fantasia M.D.   On: 06/30/2016 13:41    Lab Data:  CBC:  Recent Labs Lab 06/30/16 1845 07/01/16 2585  07/02/16 2778 07/03/16 0416 07/04/16 0425 07/05/16 0433 07/06/16 0624  WBC 10.1 9.4  < > 10.6* 10.4 9.4 11.1* 12.6*  NEUTROABS 5.4 4.6  --   --   --   --   --   --   HGB 11.7* 11.0*  < > 10.4* 11.0* 10.7* 10.9* 9.7*  HCT 35.1* 33.3*  < > 31.2* 33.1* 31.5* 32.1* 27.5*  MCV 87.1 87.4  < > 85.7 86.4 84.7 84.3 82.8  PLT 172 169  < > 173  159 163 135* 128*  < > = values in this interval not displayed. Basic Metabolic Panel:  Recent Labs Lab 06/30/16 1845 07/01/16 0806 07/02/16 0858 07/02/16 1430 07/04/16 0425  NA 135 138 139 134* 134*  K 4.0 3.8 3.6 3.1* 3.3*  CL 97* 100* 100* 97* 96*  CO2 28 30 27 28 25   GLUCOSE 127* 91 110* 128* 156*  BUN 11 16 22* <5* 16  CREATININE 3.54* 4.49* 5.96* 2.50* 5.26*  CALCIUM 8.1* 8.4* 8.0* 7.4* 8.8*  PHOS  --   --  6.5* 2.8  --    GFR: Estimated Creatinine Clearance: 9.4 mL/min (A) (by C-G formula based on SCr of 5.26 mg/dL (H)). Liver Function Tests:  Recent Labs Lab  06/30/16 1845 07/02/16 0858 07/02/16 1430  AST 90*  --   --   ALT 38  --   --   ALKPHOS 243*  --   --   BILITOT 1.0  --   --   PROT 6.8  --   --   ALBUMIN 2.0* 1.7* 1.8*   No results for input(s): LIPASE, AMYLASE in the last 168 hours. No results for input(s): AMMONIA in the last 168 hours. Coagulation Profile:  Recent Labs Lab 07/03/16 0416 07/04/16 0425 07/04/16 1054 07/05/16 0433 07/06/16 0624  INR 2.08 2.00 1.35 1.44 1.62   Cardiac Enzymes: No results for input(s): CKTOTAL, CKMB, CKMBINDEX, TROPONINI in the last 168 hours. BNP (last 3 results) No results for input(s): PROBNP in the last 8760 hours. HbA1C: No results for input(s): HGBA1C in the last 72 hours. CBG:  Recent Labs Lab 07/05/16 2045 07/06/16 0819 07/06/16 0933 07/06/16 1008 07/06/16 1251  GLUCAP 151* 64* 32* 80 173*   Lipid Profile: No results for input(s): CHOL, HDL, LDLCALC, TRIG, CHOLHDL, LDLDIRECT in the last 72 hours. Thyroid Function Tests: No results for input(s): TSH, T4TOTAL, FREET4, T3FREE, THYROIDAB in the last 72 hours. Anemia Panel: No results for input(s): VITAMINB12, FOLATE, FERRITIN, TIBC, IRON, RETICCTPCT in the last 72 hours. Urine analysis:    Component Value Date/Time   COLORURINE YELLOW 11/02/2012 0428   APPEARANCEUR CLOUDY (A) 11/02/2012 0428   LABSPEC 1.011 11/02/2012 0428   PHURINE 8.0 11/02/2012 0428   GLUCOSEU 250 (A) 11/02/2012 0428   HGBUR LARGE (A) 11/02/2012 0428   BILIRUBINUR NEGATIVE 11/02/2012 0428   KETONESUR NEGATIVE 11/02/2012 0428   PROTEINUR >300 (A) 11/02/2012 0428   UROBILINOGEN 0.2 11/02/2012 0428   NITRITE NEGATIVE 11/02/2012 0428   LEUKOCYTESUR NEGATIVE 11/02/2012 0428     Siddhi Dornbush M.D. [facility] 07/06/2016, 1:54 PM  Pager: [phone] Between 7am to 7pm - call Pager - [phone]  After 7pm go to [email or web address] - password [email or web address]  Call night coverage person covering after 7pm

## 2016-07-06 NOTE — Progress Notes (Signed)
[facility] Progress Note   Subjective:   Surgery is planned for Tuesday   Objective Vitals:   07/05/16 1130 07/05/16 1730 07/05/16 2042 07/06/16 0437  BP: (!) 126/50 (!) 110/59 (!) 130/54 (!) 110/51  Pulse: 70 (!) 59 75 73  Resp: 16 14 16 16   Temp: 98 F (36.7 C) 98.3 F (36.8 C) 98.1 F (36.7 C) 98.2 F (36.8 C)  TempSrc: Oral Oral Oral Oral  SpO2: 100% 100% 100% 99%  Weight:   53 kg (116 lb 14.4 oz)   Height:       Physical Exam General: NAD Heart: RRR Lungs: no rales Abdomen: soft NT. Scars from abd surgeries/liver xpl Extremities: left BKA dressing on poorly healing stump; right foot dark, mild edema, heel ulcer great toe black Dialysis Access: R IJ TTDC   Recent Labs Lab 07/02/16 0858 07/02/16 1430 07/04/16 0425  NA 139 134* 134*  K 3.6 3.1* 3.3*  CL 100* 97* 96*  CO2 27 28 25   GLUCOSE 110* 128* 156*  BUN 22* <5* 16  CREATININE 5.96* 2.50* 5.26*  CALCIUM 8.0* 7.4* 8.8*  PHOS 6.5* 2.8  --      Recent Labs Lab 06/30/16 1845 07/02/16 0858 07/02/16 1430  AST 90*  --   --   ALT 38  --   --   ALKPHOS 243*  --   --   BILITOT 1.0  --   --   PROT 6.8  --   --   ALBUMIN 2.0* 1.7* 1.8*     Recent Labs Lab 06/30/16 1845 07/01/16 0806  07/02/16 0858 07/03/16 0416 07/04/16 0425 07/05/16 0433 07/06/16 0624  WBC 10.1 9.4  < > 10.6* 10.4 9.4 11.1* 12.6*  NEUTROABS 5.4 4.6  --   --   --   --   --   --   HGB 11.7* 11.0*  < > 10.4* 11.0* 10.7* 10.9* 9.7*  HCT 35.1* 33.3*  < > 31.2* 33.1* 31.5* 32.1* 27.5*  MCV 87.1 87.4  < > 85.7 86.4 84.7 84.3 82.8  PLT 172 169  < > 173 159 163 135* 128*  < > = values in this interval not displayed.   Blood Culture    Component Value Date/Time   SDES BLOOD RIGHT HAND 06/30/2016 2247   SPECREQUEST  06/30/2016 2247    BOTTLES DRAWN AEROBIC AND ANAEROBIC Blood Culture adequate volume   CULT NO GROWTH 4 DAYS 06/30/2016 2247   REPTSTATUS PENDING 06/30/2016 2247     Recent Labs Lab 07/04/16 2144  07/05/16 0822 07/05/16 1156 07/05/16 2045 07/06/16 0819  GLUCAP 101* 84 104* 151* 64*    Lab Results  Component Value Date   INR 1.62 07/06/2016   INR 1.44 07/05/2016   INR 1.35 07/04/2016   Medications: . sodium chloride    . sodium chloride    . cefTRIAXone (ROCEPHIN)  IV 2 g (07/06/16 9417)   And  . metronidazole Stopped (07/06/16 0530)  . ferric gluconate (FERRLECIT/NULECIT) IV Stopped (07/05/16 1214)  . heparin 850 Units/hr (07/05/16 1225)   . aspirin EC  81 mg Oral Daily  . calcitRIOL  0.5 mcg Oral Q M,W,F-HD  . calcium acetate  667 mg Oral TID WC  . [START ON 07/07/2016] darbepoetin (ARANESP) injection - DIALYSIS  60 mcg Intravenous Q Mon-HD  . docusate sodium  100 mg Oral Daily  . feeding supplement  1 Container Oral BID BM  . feeding supplement (PRO-STAT SUGAR FREE 64)  30 mL  Oral BID  . levothyroxine  175 mcg Oral QAC breakfast  . metoCLOPramide  5 mg Oral BID AC  . metoprolol tartrate  25 mg Oral BID  . multivitamin  1 tablet Oral QHS  . polyethylene glycol  17 g Oral BID  . pregabalin  75 mg Oral Daily  . senna-docusate  1 tablet Oral BID  . tacrolimus  2 mg Oral BID    Dialysis Orders:  [facility] MWF - right IJ 3.5h 180F  BFR 450/A1.5x  2K/2Ca  Na Linear  EDW 50 kg No Heparin -Calcitriol 0.5 mcg PO q HD -Mircera 50 mcg IV q 4 weeks   Background: 61 y.o.femalewith ESRD on HD, HTN, DM, CAD, chronic back pain, 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 Scot Dock. 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 Arteriogram no surgically correctable dx. For amputation 06/15/27.    Assessment/Recommendations 1. PAD/R foot gangrene/recent L BKA (not healed) - empiric abx (Rocephn and flagyl)  Per angio results, likely will have BKA 5.29 2. ESRD - MWF . Next HD Monday K low - use 4 K bath - on regular diet 3. Anemia - Hgb 10.9 >9.7; last Mircera dose was 5/9 - off  prior to that time; last tsat 24% but no Fe given because ferritin 1200 - which I suspect is inflammatory- start weekly Fe on 5/26; redose Aranesp 60 5/27 - hgb will drop with surgery 4. Metabolic bone disease - Cont VDRA/Ca acetate binder  5. Severe protein malnutrition - Albumin 1.8 renal diet/vitamins/prostat/Boost - on regular diet to promote intake- intake poor 6. S/p mechanical AVR -  INR 1.6; anticoagulation per pharmacy 7. H/o liver transplant - on Prograf 8. DM - per primary  9.   BP/volume - above edw - cannot do standing wts - UF 2 L Wed and 1 L Friday - will do 1.5 - 2 L for Monday; on MTP 25 bid   Myriam Jacobson, PA-C [facility] Beeper [phone] 07/06/2016,9:28 AM  LOS: 6 days   I have seen and examined this patient and agree with plan and assessment in the above note with renal recommendations/intervention highlighted. Anticipate R BKA on Tuesday. L BKA not healing. MWF HD.   Dwain Huhn B,MD 07/06/2016 12:50 PM

## 2016-07-06 NOTE — Progress Notes (Signed)
[facility] for heparin Indication: mechanical AVR   Allergies  Allergen Reactions  . Acetaminophen Other (See Comments)    Liver transplant recipient   . Codeine Itching  . Mirtazapine Other (See Comments)    hallucination    Patient Measurements: Height: 5\' 4"  (162.6 cm) Weight: 116 lb 14.4 oz (53 kg) IBW/kg (Calculated) : 54.7 Heparin Dosing Weight: 52.6 kg  Vital Signs: Temp: 98.2 F (36.8 C) (05/27 0437) Temp Source: Oral (05/27 0437) BP: 110/51 (05/27 0437) Pulse Rate: 73 (05/27 0437)  Labs:  Recent Labs  07/04/16 0425 07/04/16 1054 07/05/16 0433 07/05/16 1922 07/06/16 0624  HGB 10.7*  --  10.9*  --   --   HCT 31.5*  --  32.1*  --   --   PLT 163  --  135*  --   --   LABPROT 23.0* 16.8* 17.7*  --   --   INR 2.00 1.35 1.44  --   --   HEPARINUNFRC 0.29*  --   --  0.35 0.34  CREATININE 5.26*  --   --   --   --     Estimated Creatinine Clearance: 9.4 mL/min (A) (by C-G formula based on SCr of 5.26 mg/dL (H)).   Assessment: 61yo F with mechanical AVR on warfarin PTA which is being held for possible BKA, to continue on IV heparin. Heparin level remains therapeutic today at 0.34. CBC in process, no bleeding noted.   Goal of Therapy:  Heparin level 0.3-0.7 units/ml Monitor platelets by anticoagulation protocol: Yes   Plan:  Continue heparin drip at 850 units/hr Daily heparin level and CBC Monitor for s/sx of bleeding   Gwenlyn Perking, PharmD PGY1 Pharmacy Resident [phone] 07/06/2016 8:25 AM

## 2016-07-07 LAB — CBC
HCT: 25.2 % — ABNORMAL LOW (ref 36.0–46.0)
HEMATOCRIT: 27.3 % — AB (ref 36.0–46.0)
HEMOGLOBIN: 9.7 g/dL — AB (ref 12.0–15.0)
Hemoglobin: 8.9 g/dL — ABNORMAL LOW (ref 12.0–15.0)
MCH: 29.5 pg (ref 26.0–34.0)
MCH: 29.2 pg (ref 26.0–34.0)
MCHC: 35.5 g/dL (ref 30.0–36.0)
MCHC: 35.3 g/dL (ref 30.0–36.0)
MCV: 83 fL (ref 78.0–100.0)
MCV: 82.6 fL (ref 78.0–100.0)
Platelets: 129 10*3/uL — ABNORMAL LOW (ref 150–400)
Platelets: 130 10*3/uL — ABNORMAL LOW (ref 150–400)
RBC: 3.29 MIL/uL — AB (ref 3.87–5.11)
RBC: 3.05 MIL/uL — ABNORMAL LOW (ref 3.87–5.11)
RDW: 18 % — ABNORMAL HIGH (ref 11.5–15.5)
RDW: 18.1 % — ABNORMAL HIGH (ref 11.5–15.5)
WBC: 12 10*3/uL — AB (ref 4.0–10.5)
WBC: 12.6 10*3/uL — ABNORMAL HIGH (ref 4.0–10.5)

## 2016-07-07 LAB — PROTIME-INR
INR: 1.8
PROTHROMBIN TIME: 21.1 s — AB (ref 11.4–15.2)

## 2016-07-07 LAB — GLUCOSE, CAPILLARY
GLUCOSE-CAPILLARY: 22 mg/dL — AB (ref 65–99)
GLUCOSE-CAPILLARY: 142 mg/dL — AB (ref 65–99)
GLUCOSE-CAPILLARY: 252 mg/dL — AB (ref 65–99)
GLUCOSE-CAPILLARY: 189 mg/dL — AB (ref 65–99)
Glucose-Capillary: 76 mg/dL (ref 65–99)
Glucose-Capillary: 156 mg/dL — ABNORMAL HIGH (ref 65–99)
Glucose-Capillary: 34 mg/dL — CL (ref 65–99)

## 2016-07-07 LAB — RENAL FUNCTION PANEL
Albumin: 1.7 g/dL — ABNORMAL LOW (ref 3.5–5.0)
Anion gap: 12 (ref 5–15)
BUN: 15 mg/dL (ref 6–20)
CO2: 24 mmol/L (ref 22–32)
Calcium: 9 mg/dL (ref 8.9–10.3)
Chloride: 97 mmol/L — ABNORMAL LOW (ref 101–111)
Creatinine, Ser: 6.43 mg/dL — ABNORMAL HIGH (ref 0.44–1.00)
GFR calc Af Amer: 7 mL/min — ABNORMAL LOW (ref >60)
GFR calc non Af Amer: 6 mL/min — ABNORMAL LOW (ref >60)
Glucose, Bld: 127 mg/dL — ABNORMAL HIGH (ref 65–99)
Phosphorus: 5.8 mg/dL — ABNORMAL HIGH (ref 2.5–4.6)
Potassium: 3.5 mmol/L (ref 3.5–5.1)
Sodium: 133 mmol/L — ABNORMAL LOW (ref 135–145)

## 2016-07-07 LAB — HEPARIN LEVEL (UNFRACTIONATED): Heparin Unfractionated: 0.37 IU/mL (ref 0.30–0.70)

## 2016-07-07 MED ORDER — MORPHINE SULFATE (PF) 4 MG/ML IV SOLN
INTRAVENOUS | Status: AC
  Filled 2016-07-07: qty 1

## 2016-07-07 MED ORDER — LIDOCAINE-PRILOCAINE 2.5-2.5 % EX CREA: 1.0000 "application " | TOPICAL_CREAM | CUTANEOUS | Status: DC | PRN

## 2016-07-07 MED ORDER — SODIUM CHLORIDE 0.9 % IV SOLN: 100.0000 mL | INTRAVENOUS | Status: DC | PRN

## 2016-07-07 MED ORDER — CALCITRIOL 0.5 MCG PO CAPS
ORAL_CAPSULE | ORAL | Status: AC
  Filled 2016-07-07: qty 1

## 2016-07-07 MED ORDER — ALTEPLASE 2 MG IJ SOLR: 2.0000 mg | Freq: Once | INTRAMUSCULAR | Status: DC | PRN

## 2016-07-07 MED ORDER — HEPARIN SODIUM (PORCINE) 1000 UNIT/ML DIALYSIS: 1000.0000 [IU] | INTRAMUSCULAR | Status: DC | PRN

## 2016-07-07 MED ORDER — LIDOCAINE HCL (PF) 1 % IJ SOLN: 5.0000 mL | INTRAMUSCULAR | Status: DC | PRN

## 2016-07-07 MED ORDER — CEFAZOLIN SODIUM-DEXTROSE 1-4 GM/50ML-% IV SOLN
1.0000 g | INTRAVENOUS | Status: DC
  Filled 2016-07-07: qty 50

## 2016-07-07 MED ORDER — DARBEPOETIN ALFA 60 MCG/0.3ML IJ SOSY
PREFILLED_SYRINGE | INTRAMUSCULAR | Status: AC
  Filled 2016-07-07: qty 0.3

## 2016-07-07 MED ORDER — PENTAFLUOROPROP-TETRAFLUOROETH EX AERO: 1.0000 "application " | INHALATION_SPRAY | CUTANEOUS | Status: DC | PRN

## 2016-07-07 MED ORDER — DEXTROSE 50 % IV SOLN
50.0000 mL | Freq: Once | INTRAVENOUS | Status: AC
  Administered 2016-07-06: 50 mL via INTRAVENOUS

## 2016-07-07 MED ORDER — DEXTROSE 50 % IV SOLN
INTRAVENOUS | Status: AC
  Administered 2016-07-07: 50 mL via INTRAVENOUS
  Filled 2016-07-07: qty 50

## 2016-07-07 NOTE — Procedures (Signed)
Tol HD.[name] Worried about amputation, but understands significance. K 3.5, Hgb *.9 Ashlee Bewley C

## 2016-07-07 NOTE — Progress Notes (Signed)
[facility]                                                                              Patient Demographics  [name], is a 61 y.o. female, DOB - 1955/04/30, RJJ:[id number]  Admit date - 06/30/2016   Admitting Physician Vianne Bulls, MD  Outpatient Primary MD for the patient is [name], MD  Outpatient specialists:   LOS - 7  days    Chief Complaint  Patient presents with  . Wound Check       Brief summary   [name] Blanchet [name] a 61 y.o.femalewith medical history significant for hypertension, diabetes, coronary artery disease, chronic back pain, depression, end-stage renal disease on HD, mechanical aortic valve on warfarin, and history of liver transplantation, H/o PAD s/p L BKA in 3/18 who presents to the emergency department for evaluation of wounds on the right foot and left BKA stump   Assessment & Plan   Right foot nonhealing ulcers with gangrenous changes with underlying PVD - Underlying risk factors including diabetes, ongoing tobacco abuse, ESRD - ABI right could not be ascertained due to noncompressible vessels in the dorsalis pedis artery, from posterior tibial artery indicates severe reduction in the arterial flow. Left could not be obtained due to BKA - Status post left BKA 3 months ago - Continue Flagyl, ceftriaxone, on IV heparin - Per angiogram by vascular surgery, patient will need a below-knee amputation as there really are no suitable endovascular or operative interventions that would improve flow to the right foot - No complaints today awaiting surgery tomorrow, planned for right BKA  Active problems Pressure ulcers Right heel: 3cm x 3cm x0.1cm; no drainage  Right great toe: 3cm x 2cm x 0cm:  intact, no drainage Left stump site: 1.0cm x 3.5cm x 0.3; minimal drainage, no odor - Wound care following, Vascular surgery following, plan for right BKA on 5/29  Mechanical AVR - Holding Coumadin for procedure -Continue IV  heparin  ESRD on hemodialysis MWF - Nephrology following closely, hemodialysis per schedule  Coronary artery disease - Currently stable, no chest pain, continue aspirin, beta blocker  Diabetes mellitus with hypoglycemia - Not on any meds, poor oral intake - Not on any sliding scale   History of liver transplantation - Continue Prograf  Hypertension - Currently stable, continue Lopressor  Constipation Resolved  Code Status:Partial  DVT Prophylaxis: Heparin drip, Coumadin on hold  Family Communication: Discussed in detail with the patient, all imaging results, lab results explained to the patient   Disposition Plan  plan for surgery on 5/29, subsequently patient wants to go to inpatient rehabilitation   Time Spent in minutes 15 minutes  Procedures:  ABI Right - ABI could not be ascertained due to non compressible   vessel in the dorsalis pedis artery. The ABI calculated from the   posterior tibial artery indicate s a severe reduction in arterial   flow at res. - Left - ABI cannot be obtained due to below the knee amputation.   07/04/16 Procedure: Abdominal aortogram with right lower extremity runoff  Consultants:   Nephrology Vascular surgery  Antimicrobials:   IV  ceftriaxone 5/21>  IV Flagyl 5/21>   Medications  Scheduled Meds: . aspirin EC  81 mg Oral Daily  . calcitRIOL  0.5 mcg Oral Q M,W,F-HD  . calcium acetate  667 mg Oral TID WC  . darbepoetin (ARANESP) injection - DIALYSIS  60 mcg Intravenous Q Mon-HD  . docusate sodium  100 mg Oral Daily  . feeding supplement  1 Container Oral BID BM  . feeding supplement (PRO-STAT SUGAR FREE 64)  30 mL Oral BID  . levothyroxine  175 mcg Oral QAC breakfast  . metoCLOPramide  5 mg Oral BID AC  . metoprolol tartrate  25 mg Oral BID  . multivitamin  1 tablet Oral QHS  . polyethylene glycol  17 g Oral BID  . pregabalin  75 mg Oral Daily  . senna-docusate  1 tablet Oral BID  . tacrolimus  2 mg Oral BID    Continuous Infusions: . [START ON 07/08/2016]  ceFAZolin (ANCEF) IV    . cefTRIAXone (ROCEPHIN)  IV 2 g (07/07/16 1132)   And  . metronidazole 500 mg (07/07/16 0459)  . ferric gluconate (FERRLECIT/NULECIT) IV Stopped (07/05/16 1214)  . heparin 850 Units/hr (07/07/16 1206)   PRN Meds:.acetaminophen **OR** acetaminophen, hydrALAZINE, HYDROmorphone, labetalol, methocarbamol, metoprolol tartrate, morphine injection, ondansetron **OR** ondansetron (ZOFRAN) IV, oxyCODONE   Antibiotics   Anti-infectives    Start     Dose/Rate Route Frequency Ordered Stop   07/08/16 0000  ceFAZolin (ANCEF) IVPB 1 g/50 mL premix    Comments:  Send with pt to OR   1 g 100 mL/hr over 30 Minutes Intravenous On call 07/07/16 0859 07/09/16 0000   07/01/16 0800  cefTRIAXone (ROCEPHIN) 2 g in dextrose 5 % 50 mL IVPB     2 g 100 mL/hr over 30 Minutes Intravenous Every 24 hours 06/30/16 2027     07/01/16 0800  metroNIDAZOLE (FLAGYL) IVPB 500 mg     500 mg 100 mL/hr over 60 Minutes Intravenous Every 8 hours 06/30/16 2027     06/30/16 2000  piperacillin-tazobactam (ZOSYN) IVPB 3.375 g  Status:  Discontinued     3.375 g 12.5 mL/hr over 240 Minutes Intravenous Every 12 hours 06/30/16 1925 06/30/16 2027        Subjective:   [name] was seen and examined today. No complaints, patient is agreeable for surgery. Patient denies dizziness, chest pain, shortness of breath, abdominal pain, new weakness, numbess, tingling.  Objective:   Vitals:   07/07/16 1000 07/07/16 1030 07/07/16 1110 07/07/16 1122  BP: (!) 119/50 (!) 106/53 (!) 129/52 (!) 129/52  Pulse: 70 70 75 75  Resp:  20 19   Temp:  98 F (36.7 C) 98.3 F (36.8 C)   TempSrc:  Oral Oral   SpO2:  96% 95%   Weight:  55 kg (121 lb 4.1 oz)    Height:        Intake/Output Summary (Last 24 hours) at 07/07/16 1325 Last data filed at 07/07/16 1111  Gross per 24 hour  Intake              804 ml  Output              500 ml  Net              304 ml      Wt Readings from Last 3 Encounters:  07/07/16 55 kg (121 lb 4.1 oz)  05/28/16 49.1 kg (108 lb 3.9 oz)  05/20/16 54.4 kg (120 lb)  Exam  General: Alert and oriented 3, NAD   HEENT:  Perla, EOMI  Neck: Supple, no JVD  Cardiovascular: S1 and S2 clear, mechanical valve click   Respiratory: Clear to auscultation bilaterally  Gastrointestinal: Soft, nontender, nondistended, normal bowel sounds  Ext: no cyanosis clubbing. Right great toe, right heel dry gangrene    Neuro: no new deficits  Skin: Right great toe and heel dry gangrene  Psych:  alert and oriented 3, normal affect    Data Reviewed:  I have personally reviewed following labs and imaging studies  Micro Results Recent Results (from the past 240 hour(s))  MRSA PCR Screening     Status: Abnormal   Collection Time: 06/30/16 10:15 PM  Result Value Ref Range Status   MRSA by PCR INVALID RESULTS, SPECIMEN SENT FOR CULTURE (A) NEGATIVE Final    Comment: Results Called toChapman Moss RN, AT [phone] 07/01/16 BY D.VANHOOK   MRSA culture     Status: None   Collection Time: 06/30/16 10:15 PM  Result Value Ref Range Status   Specimen Description NASAL SWAB  Final   Special Requests NONE  Final   Culture NO MRSA DETECTED  Final   Report Status 07/03/2016 FINAL  Final  Blood Cultures x 2 sites     Status: None   Collection Time: 06/30/16 10:39 PM  Result Value Ref Range Status   Specimen Description BLOOD LEFT HAND  Final   Special Requests   Final    BOTTLES DRAWN AEROBIC AND ANAEROBIC Blood Culture adequate volume   Culture NO GROWTH 5 DAYS  Final   Report Status 07/06/2016 FINAL  Final  Blood Cultures x 2 sites     Status: None   Collection Time: 06/30/16 10:47 PM  Result Value Ref Range Status   Specimen Description BLOOD RIGHT HAND  Final   Special Requests   Final    BOTTLES DRAWN AEROBIC AND ANAEROBIC Blood Culture adequate volume   Culture NO GROWTH 5 DAYS  Final   Report Status 07/06/2016 FINAL  Final     Radiology Reports Dg Tibia/fibula Left  Result Date: 06/30/2016 CLINICAL DATA:  Amputation wound, check for bony destruction EXAM: LEFT TIBIA AND FIBULA - 2 VIEW COMPARISON:  None. FINDINGS: Changes consistent with a below the knee amputation are seen. Mild osteopenia is noted. No bony erosion to suggest osteomyelitis is seen. IMPRESSION: No evidence of osteomyelitis. Electronically Signed   By: Inez Catalina M.D.   On: 06/30/2016 18:48   Dg Foot Complete Right  Result Date: 06/30/2016 CLINICAL DATA:  Open wound that will not heal since April. Wounds of the first digit and heel. EXAM: RIGHT FOOT COMPLETE - 3+ VIEW COMPARISON:  None. FINDINGS: The patient's heel wound is subtly visible. No soft tissue emphysema or opaque foreign body. No erosion to suggest osteomyelitis. Osteopenia and diffuse arterially calcification. IMPRESSION: No evidence of osteomyelitis or opaque foreign body. Electronically Signed   By: Monte Fantasia M.D.   On: 06/30/2016 13:41    Lab Data:  CBC:  Recent Labs Lab 06/30/16 1845 07/01/16 8588  07/04/16 0425 07/05/16 5027 07/06/16 7412 07/07/16 0402 07/07/16 0709  WBC 10.1 9.4  < > 9.4 11.1* 12.6* 12.0* 12.6*  NEUTROABS 5.4 4.6  --   --   --   --   --   --   HGB 11.7* 11.0*  < > 10.7* 10.9* 9.7* 9.7* 8.9*  HCT 35.1* 33.3*  < > 31.5* 32.1* 27.5* 27.3* 25.2*  MCV 87.1  87.4  < > 84.7 84.3 82.8 83.0 82.6  PLT 172 169  < > 163 135* 128* 129* 130*  < > = values in this interval not displayed. Basic Metabolic Panel:  Recent Labs Lab 07/01/16 0806 07/02/16 0858 07/02/16 1430 07/04/16 0425 07/07/16 0708  NA 138 139 134* 134* 133*  K 3.8 3.6 3.1* 3.3* 3.5  CL 100* 100* 97* 96* 97*  CO2 30 27 28 25 24   GLUCOSE 91 110* 128* 156* 127*  BUN 16 22* <5* 16 15  CREATININE 4.49* 5.96* 2.50* 5.26* 6.43*  CALCIUM 8.4* 8.0* 7.4* 8.8* 9.0  PHOS  --  6.5* 2.8  --  5.8*   GFR: Estimated Creatinine Clearance: 7.9 mL/min (A) (by C-G formula based on SCr of 6.43  mg/dL (H)). Liver Function Tests:  Recent Labs Lab 06/30/16 1845 07/02/16 0858 07/02/16 1430 07/07/16 0708  AST 90*  --   --   --   ALT 38  --   --   --   ALKPHOS 243*  --   --   --   BILITOT 1.0  --   --   --   PROT 6.8  --   --   --   ALBUMIN 2.0* 1.7* 1.8* 1.7*   No results for input(s): LIPASE, AMYLASE in the last 168 hours. No results for input(s): AMMONIA in the last 168 hours. Coagulation Profile:  Recent Labs Lab 07/04/16 0425 07/04/16 1054 07/05/16 0433 07/06/16 0624 07/07/16 0402  INR 2.00 1.35 1.44 1.62 1.80   Cardiac Enzymes: No results for input(s): CKTOTAL, CKMB, CKMBINDEX, TROPONINI in the last 168 hours. BNP (last 3 results) No results for input(s): PROBNP in the last 8760 hours. HbA1C: No results for input(s): HGBA1C in the last 72 hours. CBG:  Recent Labs Lab 07/06/16 1008 07/06/16 1251 07/06/16 1657 07/06/16 2226 07/07/16 1110  GLUCAP 80 173* 168* 210* 76   Lipid Profile: No results for input(s): CHOL, HDL, LDLCALC, TRIG, CHOLHDL, LDLDIRECT in the last 72 hours. Thyroid Function Tests: No results for input(s): TSH, T4TOTAL, FREET4, T3FREE, THYROIDAB in the last 72 hours. Anemia Panel: No results for input(s): VITAMINB12, FOLATE, FERRITIN, TIBC, IRON, RETICCTPCT in the last 72 hours. Urine analysis:    Component Value Date/Time   COLORURINE YELLOW 11/02/2012 0428   APPEARANCEUR CLOUDY (A) 11/02/2012 0428   LABSPEC 1.011 11/02/2012 0428   PHURINE 8.0 11/02/2012 0428   GLUCOSEU 250 (A) 11/02/2012 0428   HGBUR LARGE (A) 11/02/2012 0428   BILIRUBINUR NEGATIVE 11/02/2012 0428   KETONESUR NEGATIVE 11/02/2012 0428   PROTEINUR >300 (A) 11/02/2012 0428   UROBILINOGEN 0.2 11/02/2012 0428   NITRITE NEGATIVE 11/02/2012 0428   LEUKOCYTESUR NEGATIVE 11/02/2012 0428     Cantrell Larouche M.D. [facility] 07/07/2016, 1:25 PM  Pager: [phone] Between 7am to 7pm - call Pager - [phone]  After 7pm go to [email or web address] - password  [email or web address]  Call night coverage person covering after 7pm

## 2016-07-07 NOTE — Progress Notes (Signed)
[place] for heparin Indication: mechanical AVR   Allergies  Allergen Reactions  . Acetaminophen Other (See Comments)    Liver transplant recipient   . Codeine Itching  . Mirtazapine Other (See Comments)    hallucination    Patient Measurements: Height: 5\' 4"  (162.6 cm) Weight: 121 lb 14.4 oz (55.3 kg) IBW/kg (Calculated) : 54.7 Heparin Dosing Weight: 52.6 kg  Vital Signs: Temp: 98 F (36.7 C) (05/28 0700) Temp Source: Oral (05/28 0357) BP: 117/58 (05/28 0357) Pulse Rate: 66 (05/28 0357)  Labs:  Recent Labs  07/05/16 0433 07/05/16 1922 07/06/16 0624 07/07/16 0402  HGB 10.9*  --  9.7* 9.7*  HCT 32.1*  --  27.5* 27.3*  PLT 135*  --  128* 129*  LABPROT 17.7*  --  19.5* 21.1*  INR 1.44  --  1.62 1.80  HEPARINUNFRC  --  0.35 0.34 0.37    Estimated Creatinine Clearance: 9.7 mL/min (A) (by C-G formula based on SCr of 5.26 mg/dL (H)).   Assessment: 61yo F with mechanical AVR on warfarin PTA which is being held for possible BKA Tuesday 5/29, to continue on IV heparin. Heparin level remains therapeutic today at 0.37.CBC low, but stable. No active bleeding per notes.   Goal of Therapy:  Heparin level 0.3-0.7 units/ml Monitor platelets by anticoagulation protocol: Yes   Plan:  Continue heparin drip at 850 units/hr Daily heparin level and CBC Monitor for s/sx of bleeding  Argie Ramming, PharmD Pharmacy Resident  Pager [phone] 07/07/16 7:27 AM

## 2016-07-07 NOTE — Progress Notes (Signed)
Consent for right below knee amputation by Dr. Scot Dock tomorrow.  Npo after MN; consent  Dc heparin on call to the OR.   Leontine Locket, PAC 07/07/2016 9:00 AM

## 2016-07-07 NOTE — Progress Notes (Signed)
Subjective: Interval History: none.. Currently on hemodialysis. Still with rest pain in her foot.  Objective: Vital signs in last 24 hours: Temp:  [97.5 F (36.4 C)-98.7 F (37.1 C)] 98 F (36.7 C) (05/28 0700) Pulse Rate:  [62-71] 70 (05/28 0800) Resp:  [17-18] 18 (05/28 0700) BP: (77-148)/(38-66) 77/39 (05/28 0800) SpO2:  [96 %-100 %] 98 % (05/28 0700) Weight:  [121 lb 14.4 oz (55.3 kg)-122 lb 2.2 oz (55.4 kg)] 122 lb 2.2 oz (55.4 kg) (05/28 0700)  Intake/Output from previous day: 05/27 0701 - 05/28 0700 In: 1044 [P.O.:540; I.V.:204; IV Piggyback:300] Out: 0  Intake/Output this shift: No intake/output data recorded.  No change in physical exam with dry gangrene  Lab Results:  Recent Labs  07/07/16 0402 07/07/16 0709  WBC 12.0* PENDING  HGB 9.7* 8.9*  HCT 27.3* 25.2*  PLT 129* PENDING   BMET  Recent Labs  07/07/16 0708  NA 133*  K 3.5  CL 97*  CO2 24  GLUCOSE 127*  BUN 15  CREATININE 6.43*  CALCIUM 9.0    Studies/Results: Dg Tibia/fibula Left  Result Date: 06/30/2016 CLINICAL DATA:  Amputation wound, check for bony destruction EXAM: LEFT TIBIA AND FIBULA - 2 VIEW COMPARISON:  None. FINDINGS: Changes consistent with a below the knee amputation are seen. Mild osteopenia is noted. No bony erosion to suggest osteomyelitis is seen. IMPRESSION: No evidence of osteomyelitis. Electronically Signed   By: Inez Catalina M.D.   On: 06/30/2016 18:48   Dg Foot Complete Right  Result Date: 06/30/2016 CLINICAL DATA:  Open wound that will not heal since April. Wounds of the first digit and heel. EXAM: RIGHT FOOT COMPLETE - 3+ VIEW COMPARISON:  None. FINDINGS: The patient's heel wound is subtly visible. No soft tissue emphysema or opaque foreign body. No erosion to suggest osteomyelitis. Osteopenia and diffuse arterially calcification. IMPRESSION: No evidence of osteomyelitis or opaque foreign body. Electronically Signed   By: Monte Fantasia M.D.   On: 06/30/2016 13:41    Anti-infectives: Anti-infectives    Start     Dose/Rate Route Frequency Ordered Stop   07/01/16 0800  cefTRIAXone (ROCEPHIN) 2 g in dextrose 5 % 50 mL IVPB     2 g 100 mL/hr over 30 Minutes Intravenous Every 24 hours 06/30/16 2027     07/01/16 0800  metroNIDAZOLE (FLAGYL) IVPB 500 mg     500 mg 100 mL/hr over 60 Minutes Intravenous Every 8 hours 06/30/16 2027     06/30/16 2000  piperacillin-tazobactam (ZOSYN) IVPB 3.375 g  Status:  Discontinued     3.375 g 12.5 mL/hr over 240 Minutes Intravenous Every 12 hours 06/30/16 1925 06/30/16 2027      Assessment/Plan: s/p Procedure(s): Abdominal Aortogram w/Lower Extremity (N/A) 4 right below-knee amputation tomorrow with Dr. Scot Dock. Patient understands land to proceed.   LOS: 7 days   Curt Jews 07/07/2016, 8:47 AM

## 2016-07-08 ENCOUNTER — Telehealth: Payer: Self-pay | Admitting: Vascular Surgery

## 2016-07-08 ENCOUNTER — Inpatient Hospital Stay (HOSPITAL_COMMUNITY): Payer: Medicare Other | Admitting: Certified Registered"

## 2016-07-08 ENCOUNTER — Encounter (HOSPITAL_COMMUNITY)
Admission: EM | Disposition: A | Discharge status: Rehabilitation Facility | Payer: Self-pay | Source: Home / Self Care | Attending: Internal Medicine

## 2016-07-08 HISTORY — PX: AMPUTATION: SHX166

## 2016-07-08 LAB — GLUCOSE, CAPILLARY
GLUCOSE-CAPILLARY: 97 mg/dL (ref 65–99)
GLUCOSE-CAPILLARY: 57 mg/dL — AB (ref 65–99)
GLUCOSE-CAPILLARY: 164 mg/dL — AB (ref 65–99)
GLUCOSE-CAPILLARY: 129 mg/dL — AB (ref 65–99)
GLUCOSE-CAPILLARY: 101 mg/dL — AB (ref 65–99)
Glucose-Capillary: 106 mg/dL — ABNORMAL HIGH (ref 65–99)
Glucose-Capillary: 185 mg/dL — ABNORMAL HIGH (ref 65–99)

## 2016-07-08 LAB — BASIC METABOLIC PANEL
Anion gap: 9 (ref 5–15)
BUN: 7 mg/dL (ref 6–20)
CHLORIDE: 98 mmol/L — AB (ref 101–111)
CO2: 23 mmol/L (ref 22–32)
Calcium: 9.1 mg/dL (ref 8.9–10.3)
Creatinine, Ser: 4.01 mg/dL — ABNORMAL HIGH (ref 0.44–1.00)
GFR calc Af Amer: 13 mL/min — ABNORMAL LOW (ref >60)
GFR calc non Af Amer: 11 mL/min — ABNORMAL LOW (ref >60)
Glucose, Bld: 104 mg/dL — ABNORMAL HIGH (ref 65–99)
POTASSIUM: 4.4 mmol/L (ref 3.5–5.1)
SODIUM: 130 mmol/L — AB (ref 135–145)

## 2016-07-08 LAB — CBC
HEMATOCRIT: 29.6 % — AB (ref 36.0–46.0)
HEMOGLOBIN: 10.3 g/dL — AB (ref 12.0–15.0)
MCH: 28.6 pg (ref 26.0–34.0)
MCHC: 34.8 g/dL (ref 30.0–36.0)
MCV: 82.2 fL (ref 78.0–100.0)
Platelets: 140 10*3/uL — ABNORMAL LOW (ref 150–400)
RBC: 3.6 MIL/uL — ABNORMAL LOW (ref 3.87–5.11)
RDW: 18 % — ABNORMAL HIGH (ref 11.5–15.5)
WBC: 16.5 10*3/uL — ABNORMAL HIGH (ref 4.0–10.5)

## 2016-07-08 LAB — SURGICAL PCR SCREEN
MRSA, PCR: NEGATIVE
STAPHYLOCOCCUS AUREUS: POSITIVE — AB

## 2016-07-08 LAB — PROTIME-INR
INR: 1.5
Prothrombin Time: 18.3 seconds — ABNORMAL HIGH (ref 11.4–15.2)

## 2016-07-08 LAB — HEPARIN LEVEL (UNFRACTIONATED): Heparin Unfractionated: 0.27 IU/mL — ABNORMAL LOW (ref 0.30–0.70)

## 2016-07-08 SURGERY — AMPUTATION BELOW KNEE
Anesthesia: General | Site: Leg Lower | Laterality: Right

## 2016-07-08 MED ORDER — SODIUM CHLORIDE 0.9 % IV SOLN
INTRAVENOUS | Status: DC
  Administered 2016-07-08 (×2): via INTRAVENOUS

## 2016-07-08 MED ORDER — LIDOCAINE HCL (CARDIAC) 20 MG/ML IV SOLN
INTRAVENOUS | Status: DC | PRN
  Administered 2016-07-08: 50 mg via INTRAVENOUS

## 2016-07-08 MED ORDER — FENTANYL CITRATE (PF) 250 MCG/5ML IJ SOLN
INTRAMUSCULAR | Status: AC
  Filled 2016-07-08: qty 5

## 2016-07-08 MED ORDER — HEPARIN (PORCINE) IN NACL 100-0.45 UNIT/ML-% IJ SOLN
1050.0000 [IU]/h | INTRAMUSCULAR | Status: DC
  Administered 2016-07-08: 950 [IU]/h via INTRAVENOUS
  Administered 2016-07-09 – 2016-07-10 (×2): 1050 [IU]/h via INTRAVENOUS
  Filled 2016-07-08 (×3): qty 250

## 2016-07-08 MED ORDER — CEFAZOLIN SODIUM-DEXTROSE 2-4 GM/100ML-% IV SOLN
INTRAVENOUS | Status: AC
  Filled 2016-07-08: qty 100

## 2016-07-08 MED ORDER — MUPIROCIN 2 % EX OINT
1.0000 "application " | TOPICAL_OINTMENT | Freq: Two times a day (BID) | CUTANEOUS | Status: DC
  Administered 2016-07-08 – 2016-07-09 (×4): 1 via NASAL
  Filled 2016-07-08: qty 22

## 2016-07-08 MED ORDER — 0.9 % SODIUM CHLORIDE (POUR BTL) OPTIME
TOPICAL | Status: DC | PRN
  Administered 2016-07-08: 1000 mL

## 2016-07-08 MED ORDER — DEXTROSE 50 % IV SOLN
50.0000 mL | Freq: Once | INTRAVENOUS | Status: AC
  Administered 2016-07-07 – 2016-07-08 (×2): 50 mL via INTRAVENOUS

## 2016-07-08 MED ORDER — PROPOFOL 10 MG/ML IV BOLUS
INTRAVENOUS | Status: DC | PRN
  Administered 2016-07-08: 150 mg via INTRAVENOUS

## 2016-07-08 MED ORDER — CEFAZOLIN SODIUM-DEXTROSE 2-3 GM-% IV SOLR
INTRAVENOUS | Status: DC | PRN
  Administered 2016-07-08: 2 g via INTRAVENOUS

## 2016-07-08 MED ORDER — PHENYLEPHRINE HCL 10 MG/ML IJ SOLN
INTRAMUSCULAR | Status: DC | PRN
  Administered 2016-07-08 (×2): 80 ug via INTRAVENOUS

## 2016-07-08 MED ORDER — BUPIVACAINE-EPINEPHRINE (PF) 0.5% -1:200000 IJ SOLN
INTRAMUSCULAR | Status: DC | PRN
  Administered 2016-07-08: 25 mL via PERINEURAL

## 2016-07-08 MED ORDER — EPHEDRINE SULFATE 50 MG/ML IJ SOLN
INTRAMUSCULAR | Status: DC | PRN
  Administered 2016-07-08 (×2): 10 mg via INTRAVENOUS

## 2016-07-08 MED ORDER — CEFAZOLIN SODIUM-DEXTROSE 2-4 GM/100ML-% IV SOLN: 2.0000 g | Freq: Once | INTRAVENOUS | Status: DC

## 2016-07-08 MED ORDER — FENTANYL CITRATE (PF) 100 MCG/2ML IJ SOLN: 25.0000 ug | INTRAMUSCULAR | Status: DC | PRN

## 2016-07-08 MED ORDER — ALBUMIN HUMAN 5 % IV SOLN
INTRAVENOUS | Status: DC | PRN
  Administered 2016-07-08: 14:00:00 via INTRAVENOUS

## 2016-07-08 MED ORDER — FENTANYL CITRATE (PF) 100 MCG/2ML IJ SOLN
INTRAMUSCULAR | Status: DC | PRN
  Administered 2016-07-08: 50 ug via INTRAVENOUS

## 2016-07-08 MED ORDER — PHENYLEPHRINE HCL 10 MG/ML IJ SOLN
INTRAMUSCULAR | Status: DC | PRN
  Administered 2016-07-08: 50 ug/min via INTRAVENOUS

## 2016-07-08 MED ORDER — OXYCODONE HCL 5 MG PO TABS
5.0000 mg | ORAL_TABLET | ORAL | Status: DC | PRN
  Administered 2016-07-08 – 2016-07-10 (×4): 5 mg via ORAL
  Filled 2016-07-08 (×3): qty 1

## 2016-07-08 MED ORDER — CHLORHEXIDINE GLUCONATE CLOTH 2 % EX PADS
6.0000 | MEDICATED_PAD | Freq: Every day | CUTANEOUS | Status: DC
  Administered 2016-07-08: 6 via TOPICAL

## 2016-07-08 MED ORDER — BACITRACIN ZINC 500 UNIT/GM EX OINT
TOPICAL_OINTMENT | CUTANEOUS | Status: AC
  Filled 2016-07-08: qty 28.35

## 2016-07-08 MED ORDER — GI COCKTAIL ~~LOC~~
30.0000 mL | Freq: Two times a day (BID) | ORAL | Status: DC | PRN
  Administered 2016-07-08: 30 mL via ORAL
  Filled 2016-07-08: qty 30

## 2016-07-08 MED ORDER — BACITRACIN ZINC 500 UNIT/GM EX OINT
TOPICAL_OINTMENT | CUTANEOUS | Status: DC | PRN
  Administered 2016-07-08: 1 via TOPICAL

## 2016-07-08 MED ORDER — DEXTROSE 50 % IV SOLN
INTRAVENOUS | Status: AC
  Filled 2016-07-08: qty 50

## 2016-07-08 SURGICAL SUPPLY — 50 items
BANDAGE ACE 4X5 VEL STRL LF (GAUZE/BANDAGES/DRESSINGS) ×6 IMPLANT
BANDAGE ELASTIC 4 VELCRO ST LF (GAUZE/BANDAGES/DRESSINGS) ×3 IMPLANT
BANDAGE ESMARK 6X9 LF (GAUZE/BANDAGES/DRESSINGS) ×1 IMPLANT
BLADE SAW RECIP 87.9 MT (BLADE) ×3 IMPLANT
BNDG COHESIVE 6X5 TAN STRL LF (GAUZE/BANDAGES/DRESSINGS) ×3 IMPLANT
BNDG ESMARK 6X9 LF (GAUZE/BANDAGES/DRESSINGS) ×3
BNDG GAUZE ELAST 4 BULKY (GAUZE/BANDAGES/DRESSINGS) ×3 IMPLANT
CANISTER SUCT 3000ML PPV (MISCELLANEOUS) ×6 IMPLANT
CLIP TI MEDIUM 6 (CLIP) IMPLANT
COVER SURGICAL LIGHT HANDLE (MISCELLANEOUS) ×3 IMPLANT
CUFF TOURNIQUET SINGLE 24IN (TOURNIQUET CUFF) ×3 IMPLANT
CUFF TOURNIQUET SINGLE 34IN LL (TOURNIQUET CUFF) IMPLANT
CUFF TOURNIQUET SINGLE 44IN (TOURNIQUET CUFF) IMPLANT
DRAIN CHANNEL 19F RND (DRAIN) IMPLANT
DRAPE HALF SHEET 40X57 (DRAPES) ×3 IMPLANT
DRAPE ORTHO SPLIT 77X108 STRL (DRAPES) ×4
DRAPE SURG ORHT 6 SPLT 77X108 (DRAPES) ×2 IMPLANT
DRAPE U-SHAPE 47X51 STRL (DRAPES) ×3 IMPLANT
DRSG ADAPTIC 3X8 NADH LF (GAUZE/BANDAGES/DRESSINGS) ×3 IMPLANT
ELECT REM PT RETURN 9FT ADLT (ELECTROSURGICAL) ×3
ELECTRODE REM PT RTRN 9FT ADLT (ELECTROSURGICAL) ×1 IMPLANT
EVACUATOR SILICONE 100CC (DRAIN) IMPLANT
GAUZE SPONGE 4X4 12PLY STRL (GAUZE/BANDAGES/DRESSINGS) ×6 IMPLANT
GAUZE SPONGE 4X4 12PLY STRL LF (GAUZE/BANDAGES/DRESSINGS) ×3 IMPLANT
GLOVE BIO SURGEON STRL SZ7.5 (GLOVE) ×6 IMPLANT
GLOVE BIOGEL PI IND STRL 6.5 (GLOVE) ×2 IMPLANT
GLOVE BIOGEL PI IND STRL 8 (GLOVE) ×1 IMPLANT
GLOVE BIOGEL PI INDICATOR 6.5 (GLOVE) ×4
GLOVE BIOGEL PI INDICATOR 8 (GLOVE) ×2
GLOVE ECLIPSE 6.5 STRL STRAW (GLOVE) ×6 IMPLANT
GOWN STRL REUS W/ TWL LRG LVL3 (GOWN DISPOSABLE) ×3 IMPLANT
GOWN STRL REUS W/TWL LRG LVL3 (GOWN DISPOSABLE) ×6
KIT BASIN OR (CUSTOM PROCEDURE TRAY) ×3 IMPLANT
KIT ROOM TURNOVER OR (KITS) ×3 IMPLANT
NS IRRIG 1000ML POUR BTL (IV SOLUTION) ×3 IMPLANT
PACK GENERAL/GYN (CUSTOM PROCEDURE TRAY) ×3 IMPLANT
PAD ARMBOARD 7.5X6 YLW CONV (MISCELLANEOUS) ×6 IMPLANT
STAPLER VISISTAT (STAPLE) ×3 IMPLANT
STOCKINETTE IMPERVIOUS LG (DRAPES) ×3 IMPLANT
SUT ETHILON 3 0 PS 1 (SUTURE) IMPLANT
SUT SILK 0 TIES 10X30 (SUTURE) ×3 IMPLANT
SUT SILK 2 0 (SUTURE) ×2
SUT SILK 2 0 SH CR/8 (SUTURE) ×3 IMPLANT
SUT SILK 2-0 18XBRD TIE 12 (SUTURE) ×1 IMPLANT
SUT SILK 3 0 (SUTURE) ×2
SUT SILK 3-0 18XBRD TIE 12 (SUTURE) ×1 IMPLANT
SUT VIC AB 2-0 CT1 18 (SUTURE) ×6 IMPLANT
TOWEL GREEN STERILE (TOWEL DISPOSABLE) ×3 IMPLANT
UNDERPAD 30X30 (UNDERPADS AND DIAPERS) ×3 IMPLANT
WATER STERILE IRR 1000ML POUR (IV SOLUTION) ×3 IMPLANT

## 2016-07-08 NOTE — Progress Notes (Signed)
[facility]                                                                              Patient Demographics  [name], is a 61 y.o. female, DOB - Dec 18, 1955, WNI:[id number]  Admit date - 06/30/2016   Admitting Physician Vianne Bulls, MD  Outpatient Primary MD for the patient is [name], MD  Outpatient specialists:   LOS - 8  days    Chief Complaint  Patient presents with  . Wound Check       Brief summary   [name] Bodenheimer [name] a 61 y.o.femalewith medical history significant for hypertension, diabetes, coronary artery disease, chronic back pain, depression, end-stage renal disease on HD, mechanical aortic valve on warfarin, and history of liver transplantation, H/o PAD s/p L BKA in 3/18 who presents to the emergency department for evaluation of wounds on the right foot and left BKA stump   Assessment & Plan   Right foot nonhealing ulcers with gangrenous changes with underlying PVD - Underlying risk factors including diabetes, ongoing tobacco abuse, ESRD - ABI right could not be ascertained due to noncompressible vessels in the dorsalis pedis artery, from posterior tibial artery indicates severe reduction in the arterial flow. Left could not be obtained due to BKA - Status post left BKA 3 months ago - Continue Flagyl, ceftriaxone, on IV heparin - Patient underwent arteriogram on 5/25 by vascular surgery, no suitable endovascular or operative interventions that would improve flow to the right foot, hence will need BKA - Scheduled for right BKA today   Active problems Pressure ulcers Right heel: 3cm x 3cm x0.1cm; no drainage  Right great toe: 3cm x 2cm x 0cm:  intact, no drainage Left stump site: 1.0cm x 3.5cm x 0.3; minimal drainage, no odor - Wound care following, Vascular surgery following, plan for right BKA today  Mechanical AVR - Holding Coumadin for procedure -Continue IV heparin  ESRD on hemodialysis MWF - Nephrology following  closely, hemodialysis per schedule  Coronary artery disease - Currently stable, no chest pain, continue aspirin, beta blocker  Diabetes mellitus with hypoglycemia - Not on any meds, poor oral intake - Not on any sliding scale   History of liver transplantation - Continue Prograf  Hypertension - Currently stable, continue Lopressor  Constipation Resolved  Code Status:Partial  DVT Prophylaxis: Heparin drip, Coumadin on hold  Family Communication: Discussed in detail with the patient, all imaging results, lab results explained to the patient   Disposition Plan: Surgery for right BKA on 5/29 today, subsequently patient wants to go to inpatient rehabilitation   Time Spent in minutes 25 minutes  Procedures:  ABI Right - ABI could not be ascertained due to non compressible   vessel in the dorsalis pedis artery. The ABI calculated from the   posterior tibial artery indicate s a severe reduction in arterial   flow at res. - Left - ABI cannot be obtained due to below the knee amputation.   07/04/16 Procedure: Abdominal aortogram with right lower extremity runoff  Consultants:   Nephrology Vascular surgery  Antimicrobials:   IV ceftriaxone 5/21>  IV Flagyl 5/21>  Medications  Scheduled Meds: . [MAR Hold] aspirin EC  81 mg Oral Daily  . [MAR Hold] calcitRIOL  0.5 mcg Oral Q M,W,F-HD  . [MAR Hold] calcium acetate  667 mg Oral TID WC  . [MAR Hold] Chlorhexidine Gluconate Cloth  6 each Topical Daily  . [MAR Hold] darbepoetin (ARANESP) injection - DIALYSIS  60 mcg Intravenous Q Mon-HD  . [MAR Hold] docusate sodium  100 mg Oral Daily  . [MAR Hold] feeding supplement  1 Container Oral BID BM  . [MAR Hold] feeding supplement (PRO-STAT SUGAR FREE 64)  30 mL Oral BID  . [MAR Hold] levothyroxine  175 mcg Oral QAC breakfast  . [MAR Hold] metoCLOPramide  5 mg Oral BID AC  . [MAR Hold] metoprolol tartrate  25 mg Oral BID  . [MAR Hold] multivitamin  1 tablet Oral QHS  . [MAR Hold]  mupirocin ointment  1 application Nasal BID  . [MAR Hold] polyethylene glycol  17 g Oral BID  . [MAR Hold] pregabalin  75 mg Oral Daily  . [MAR Hold] senna-docusate  1 tablet Oral BID  . [MAR Hold] tacrolimus  2 mg Oral BID   Continuous Infusions: . sodium chloride Stopped (07/08/16 1359)  . ceFAZolin    .  ceFAZolin (ANCEF) IV    . [MAR Hold] cefTRIAXone (ROCEPHIN)  IV Stopped (07/08/16 0850)   And  . [MAR Hold] metronidazole 500 mg (07/08/16 0557)  . [MAR Hold] ferric gluconate (FERRLECIT/NULECIT) IV Stopped (07/05/16 1214)   PRN Meds:.0.9 % irrigation (POUR BTL), [MAR Hold] acetaminophen **OR** [MAR Hold] acetaminophen, bacitracin, [MAR Hold] gi cocktail, [MAR Hold] hydrALAZINE, [MAR Hold] HYDROmorphone, [MAR Hold] labetalol, [MAR Hold] methocarbamol, [MAR Hold] metoprolol tartrate, [MAR Hold]  morphine injection, [MAR Hold] ondansetron **OR** [MAR Hold] ondansetron (ZOFRAN) IV, [MAR Hold] oxyCODONE   Antibiotics   Anti-infectives    Start     Dose/Rate Route Frequency Ordered Stop   07/08/16 1315  ceFAZolin (ANCEF) IVPB 2g/100 mL premix     2 g 200 mL/hr over 30 Minutes Intravenous  Once 07/08/16 1308     07/08/16 1254  ceFAZolin (ANCEF) 2-4 GM/100ML-% IVPB    Comments:  Forte, Lindsi   : cabinet override      07/08/16 1254 07/09/16 0059   07/08/16 0000  ceFAZolin (ANCEF) IVPB 1 g/50 mL premix  Status:  Discontinued    Comments:  Send with pt to OR   1 g 100 mL/hr over 30 Minutes Intravenous On call 07/07/16 0859 07/08/16 1307   07/01/16 0800  [MAR Hold]  cefTRIAXone (ROCEPHIN) 2 g in dextrose 5 % 50 mL IVPB     (MAR Hold since 07/08/16 1202)   2 g 100 mL/hr over 30 Minutes Intravenous Every 24 hours 06/30/16 2027     07/01/16 0800  [MAR Hold]  metroNIDAZOLE (FLAGYL) IVPB 500 mg     (MAR Hold since 07/08/16 1202)   500 mg 100 mL/hr over 60 Minutes Intravenous Every 8 hours 06/30/16 2027     06/30/16 2000  piperacillin-tazobactam (ZOSYN) IVPB 3.375 g  Status:  Discontinued       3.375 g 12.5 mL/hr over 240 Minutes Intravenous Every 12 hours 06/30/16 1925 06/30/16 2027        Subjective:   [name] was seen and examined today. No complaints, patient is agreeable for surgery. Patient denies dizziness, chest pain, shortness of breath, abdominal pain, new weakness, numbess, tingling.  Objective:   Vitals:   07/07/16 1734 07/07/16 2052 07/08/16 0528 07/08/16  1023  BP: (!) 128/54 (!) 126/53 (!) 126/58 (!) 139/58  Pulse: 75 78 77 73  Resp: 16 18 16 18   Temp: 97.8 F (36.6 C) 99.3 F (37.4 C) 98.1 F (36.7 C) 98.1 F (36.7 C)  TempSrc: Oral Oral Oral Oral  SpO2:  99% 91% 99%  Weight:  56.3 kg (124 lb 1.9 oz)    Height:        Intake/Output Summary (Last 24 hours) at 07/08/16 1425 Last data filed at 07/08/16 1359  Gross per 24 hour  Intake          1591.06 ml  Output                0 ml  Net          1591.06 ml     Wt Readings from Last 3 Encounters:  07/07/16 56.3 kg (124 lb 1.9 oz)  05/28/16 49.1 kg (108 lb 3.9 oz)  05/20/16 54.4 kg (120 lb)     Exam  General: Alert and oriented 3, NAD   HEENT:    Neck: Supple, no JVD  Cardiovascular: S1 and S2 clear, mechanical valve click   Respiratory: CTAB  Gastrointestinal: Soft, NT, ND, NBS   Ext: no cyanosis clubbing. Right great toe, right heel dry gangrene    Neuro: no new deficits  Skin: Right great toe and heel dry gangrene  Psych:  alert and oriented 3, normal affect    Data Reviewed:  I have personally reviewed following labs and imaging studies  Micro Results Recent Results (from the past 240 hour(s))  MRSA PCR Screening     Status: Abnormal   Collection Time: 06/30/16 10:15 PM  Result Value Ref Range Status   MRSA by PCR INVALID RESULTS, SPECIMEN SENT FOR CULTURE (A) NEGATIVE Final    Comment: Results Called toChapman Moss RN, AT [phone] 07/01/16 BY D.VANHOOK   MRSA culture     Status: None   Collection Time: 06/30/16 10:15 PM  Result Value Ref Range Status   Specimen  Description NASAL SWAB  Final   Special Requests NONE  Final   Culture NO MRSA DETECTED  Final   Report Status 07/03/2016 FINAL  Final  Blood Cultures x 2 sites     Status: None   Collection Time: 06/30/16 10:39 PM  Result Value Ref Range Status   Specimen Description BLOOD LEFT HAND  Final   Special Requests   Final    BOTTLES DRAWN AEROBIC AND ANAEROBIC Blood Culture adequate volume   Culture NO GROWTH 5 DAYS  Final   Report Status 07/06/2016 FINAL  Final  Blood Cultures x 2 sites     Status: None   Collection Time: 06/30/16 10:47 PM  Result Value Ref Range Status   Specimen Description BLOOD RIGHT HAND  Final   Special Requests   Final    BOTTLES DRAWN AEROBIC AND ANAEROBIC Blood Culture adequate volume   Culture NO GROWTH 5 DAYS  Final   Report Status 07/06/2016 FINAL  Final  Surgical pcr screen     Status: Abnormal   Collection Time: 07/07/16  8:15 PM  Result Value Ref Range Status   MRSA, PCR NEGATIVE NEGATIVE Final   Staphylococcus aureus POSITIVE (A) NEGATIVE Final    Comment:        The Xpert SA Assay (FDA approved for NASAL specimens in patients over 38 years of age), is one component of a comprehensive surveillance program.  Test performance has been validated  by [facility] for patients greater than or equal to 14 year old. It is not intended to diagnose infection nor to guide or monitor treatment.     Radiology Reports Dg Tibia/fibula Left  Result Date: 06/30/2016 CLINICAL DATA:  Amputation wound, check for bony destruction EXAM: LEFT TIBIA AND FIBULA - 2 VIEW COMPARISON:  None. FINDINGS: Changes consistent with a below the knee amputation are seen. Mild osteopenia is noted. No bony erosion to suggest osteomyelitis is seen. IMPRESSION: No evidence of osteomyelitis. Electronically Signed   By: Inez Catalina M.D.   On: 06/30/2016 18:48   Dg Foot Complete Right  Result Date: 06/30/2016 CLINICAL DATA:  Open wound that will not heal since April. Wounds of the  first digit and heel. EXAM: RIGHT FOOT COMPLETE - 3+ VIEW COMPARISON:  None. FINDINGS: The patient's heel wound is subtly visible. No soft tissue emphysema or opaque foreign body. No erosion to suggest osteomyelitis. Osteopenia and diffuse arterially calcification. IMPRESSION: No evidence of osteomyelitis or opaque foreign body. Electronically Signed   By: Monte Fantasia M.D.   On: 06/30/2016 13:41    Lab Data:  CBC:  Recent Labs Lab 07/05/16 0433 07/06/16 4540 07/07/16 0402 07/07/16 0709 07/08/16 0739  WBC 11.1* 12.6* 12.0* 12.6* 16.5*  HGB 10.9* 9.7* 9.7* 8.9* 10.3*  HCT 32.1* 27.5* 27.3* 25.2* 29.6*  MCV 84.3 82.8 83.0 82.6 82.2  PLT 135* 128* 129* 130* 981*   Basic Metabolic Panel:  Recent Labs Lab 07/02/16 0858 07/02/16 1430 07/04/16 0425 07/07/16 0708 07/08/16 0739  NA 139 134* 134* 133* 130*  K 3.6 3.1* 3.3* 3.5 4.4  CL 100* 97* 96* 97* 98*  CO2 27 28 25 24 23   GLUCOSE 110* 128* 156* 127* 104*  BUN 22* <5* 16 15 7   CREATININE 5.96* 2.50* 5.26* 6.43* 4.01*  CALCIUM 8.0* 7.4* 8.8* 9.0 9.1  PHOS 6.5* 2.8  --  5.8*  --    GFR: Estimated Creatinine Clearance: 12.7 mL/min (A) (by C-G formula based on SCr of 4.01 mg/dL (H)). Liver Function Tests:  Recent Labs Lab 07/02/16 0858 07/02/16 1430 07/07/16 0708  ALBUMIN 1.7* 1.8* 1.7*   No results for input(s): LIPASE, AMYLASE in the last 168 hours. No results for input(s): AMMONIA in the last 168 hours. Coagulation Profile:  Recent Labs Lab 07/04/16 1054 07/05/16 0433 07/06/16 0624 07/07/16 0402 07/08/16 0739  INR 1.35 1.44 1.62 1.80 1.50   Cardiac Enzymes: No results for input(s): CKTOTAL, CKMB, CKMBINDEX, TROPONINI in the last 168 hours. BNP (last 3 results) No results for input(s): PROBNP in the last 8760 hours. HbA1C: No results for input(s): HGBA1C in the last 72 hours. CBG:  Recent Labs Lab 07/07/16 1736 07/07/16 2051 07/08/16 0805 07/08/16 1135 07/08/16 1249  GLUCAP 252* 189* 106* 57*  164*   Lipid Profile: No results for input(s): CHOL, HDL, LDLCALC, TRIG, CHOLHDL, LDLDIRECT in the last 72 hours. Thyroid Function Tests: No results for input(s): TSH, T4TOTAL, FREET4, T3FREE, THYROIDAB in the last 72 hours. Anemia Panel: No results for input(s): VITAMINB12, FOLATE, FERRITIN, TIBC, IRON, RETICCTPCT in the last 72 hours. Urine analysis:    Component Value Date/Time   COLORURINE YELLOW 11/02/2012 0428   APPEARANCEUR CLOUDY (A) 11/02/2012 0428   LABSPEC 1.011 11/02/2012 0428   PHURINE 8.0 11/02/2012 0428   GLUCOSEU 250 (A) 11/02/2012 0428   HGBUR LARGE (A) 11/02/2012 0428   BILIRUBINUR NEGATIVE 11/02/2012 0428   KETONESUR NEGATIVE 11/02/2012 0428   PROTEINUR >300 (A) 11/02/2012 1914  UROBILINOGEN 0.2 11/02/2012 0428   NITRITE NEGATIVE 11/02/2012 0428   LEUKOCYTESUR NEGATIVE 11/02/2012 0428     Jaleel Allen M.D. [facility] 07/08/2016, 2:24 PM  Pager: [phone] Between 7am to 7pm - call Pager - [phone]  After 7pm go to [email or web address] - password [email or web address]  Call night coverage person covering after 7pm

## 2016-07-08 NOTE — Anesthesia Postprocedure Evaluation (Signed)
Anesthesia Post Note  Patient: [name]  Procedure(s) Performed: Procedure(s) (LRB): AMPUTATION BELOW KNEE (Right)  Patient location during evaluation: PACU Anesthesia Type: General Level of consciousness: awake Pain management: pain level controlled Vital Signs Assessment: post-procedure vital signs reviewed and stable Respiratory status: spontaneous breathing Cardiovascular status: stable Anesthetic complications: no       Last Vitals:  Vitals:   07/08/16 1515 07/08/16 1534  BP: 100/61 (!) 105/52  Pulse:  63  Resp: 12 14  Temp: (!) 36.1 C 36.4 C    Last Pain:  Vitals:   07/08/16 1534  TempSrc: Oral  PainSc:                  [name]

## 2016-07-08 NOTE — Op Note (Signed)
    NAME: [name]    MRN: [id number] DOB: 1955-11-11    DATE OF OPERATION: 07/08/2016  PREOP DIAGNOSIS:    Ischemic right lower extremity  POSTOP DIAGNOSIS:    Same  PROCEDURE:    RIGHT BELOW THE KNEE AMPUTATION   SURGEON: Judeth Cornfield. Scot Dock, MD, FACS  ASSIST: Silva Bandy, [facility]  ANESTHESIA: Regional anesthesia   EBL: Minimal  INDICATIONS:    [name] is a 61 y.o. female with rest pain of the right leg and no options for revascularization.  FINDINGS:    The muscle appeared well perfused and contracted well. There were no signs of infection.  TECHNIQUE:    The patient was taken to the operating room and a block had been placed by anesthesia. The right lower extremity was prepped and draped in usual sterile fashion. The circumference of the limb was measured 10 cm distal to the tibial tuberosity. Two thirds of this distance was used to mark the anterior skin incision. A long posterior incision of equal length was marked. The leg was exsanguinated with an Esmarch bandage and the tourniquet inflated to 300 mmHg. Under tourniquet control, the incision was carried down to the skin, subcutaneous tissue, fascia, muscle to the tibia and fibula which were dissected free circumferentially. The periosteum was elevated proximal to the level of skin division. The bone was divided. The anterior aspect of the tibia was beveled. The arteries were markedly calcified in the tourniquet was not occlusive. These were suture ligated with 2-0 silk ties. The tourniquet was then released. Additional hemostasis was obtained using 2-0 silk sutures and electrocautery. The edges of the bone were rasped. The wound was irrigated with cups amounts of saline. The fascial layer was closed with interrupted 2-0 Vicryl's. The skin was closed with staples. Sterile dressing was applied. The patient tolerated the procedure well and was transferred to the recovery room in stable condition. All needle and sponge  counts were correct.  [name], MD, FACS [facility]  DATE OF DICTATION:   07/08/2016

## 2016-07-08 NOTE — Telephone Encounter (Signed)
-----   Message from Mena Goes, RN sent at 07/08/2016  3:08 PM EDT ----- Regarding: 1 month appt    ----- Message ----- From: Angelia Mould, MD Sent: 07/08/2016   2:37 PM To: Vvs Charge Pool Subject: charge                                         This patient had a right below the knee amputation. Assistant was Silva Bandy, [facility] The patient will need a follow up visit in 1 month for staple removal. Thank you. CD

## 2016-07-08 NOTE — Anesthesia Procedure Notes (Addendum)
Anesthesia Regional Block: Popliteal block   Pre-Anesthetic Checklist: ,, timeout performed, Correct Patient, Correct Site, Correct Laterality, Correct Procedure, Correct Position, site marked, Risks and benefits discussed,  Surgical consent,  Pre-op evaluation,  At surgeon's request and post-op pain management  Laterality: Right and Lower  Prep: chloraprep       Needles:   Needle Type: Echogenic Stimulator Needle     Needle Length: 9cm  Needle Gauge: 21     Additional Needles:   Procedures: ultrasound guided,,,,,,,,  Narrative:  Start time: 07/08/2016 1:12 PM End time: 07/08/2016 1:38 PM Injection made incrementally with aspirations every 5 mL.  Performed by: Personally  Anesthesiologist: Merna Baldi

## 2016-07-08 NOTE — Progress Notes (Signed)
ANTICOAGULATION CONSULT NOTE - Follow-Up  Pharmacy Consult for heparin Indication: mechanical AVR   Allergies  Allergen Reactions  . Acetaminophen Other (See Comments)    Liver transplant recipient   . Codeine Itching  . Mirtazapine Other (See Comments)    hallucination    Patient Measurements: Height: 5\' 4"  (162.6 cm) Weight: 124 lb 1.9 oz (56.3 kg) IBW/kg (Calculated) : 54.7 Heparin Dosing Weight: 52.6 kg  Vital Signs: Temp: 97.6 F (36.4 C) (05/29 1534) Temp Source: Oral (05/29 1534) BP: 105/52 (05/29 1534) Pulse Rate: 63 (05/29 1534)  Labs:  Recent Labs  07/06/16 5701 07/07/16 0402 07/07/16 0708 07/07/16 0709 07/08/16 0739  HGB 9.7* 9.7*  --  8.9* 10.3*  HCT 27.5* 27.3*  --  25.2* 29.6*  PLT 128* 129*  --  130* 140*  LABPROT 19.5* 21.1*  --   --  18.3*  INR 1.62 1.80  --   --  1.50  HEPARINUNFRC 0.34 0.37  --   --  0.27*  CREATININE  --   --  6.43*  --  4.01*    Estimated Creatinine Clearance: 12.7 mL/min (A) (by C-G formula based on SCr of 4.01 mg/dL (H)).   Assessment: 61 yo F with mechanical AVR on warfarin PTA which was held for right BKA earlier today. RN spoke with Pablo Ledger, [place] with Vascular to confirm ok to restart heparin post-op.  Will resume at previous rate of 950 units/hr.   Goal of Therapy:  Heparin level 0.3-0.7 units/ml Monitor platelets by anticoagulation protocol: Yes   Plan:  Heparin drip at 950 units/hr Daily heparin level and CBC Monitor for s/sx of bleeding Please advise pharmacy when safe to resume warfarin post-op   [employer], Pharm.D., BCPS Clinical Pharmacist Pager: [phone] 07/08/2016 4:10 PM

## 2016-07-08 NOTE — H&P (View-Only) (Signed)
Subjective: Interval History: none.. Currently on hemodialysis. Still with rest pain in her foot.  Objective: Vital signs in last 24 hours: Temp:  [97.5 F (36.4 C)-98.7 F (37.1 C)] 98 F (36.7 C) (05/28 0700) Pulse Rate:  [62-71] 70 (05/28 0800) Resp:  [17-18] 18 (05/28 0700) BP: (77-148)/(38-66) 77/39 (05/28 0800) SpO2:  [96 %-100 %] 98 % (05/28 0700) Weight:  [121 lb 14.4 oz (55.3 kg)-122 lb 2.2 oz (55.4 kg)] 122 lb 2.2 oz (55.4 kg) (05/28 0700)  Intake/Output from previous day: 05/27 0701 - 05/28 0700 In: 1044 [P.O.:540; I.V.:204; IV Piggyback:300] Out: 0  Intake/Output this shift: No intake/output data recorded.  No change in physical exam with dry gangrene  Lab Results:  Recent Labs  07/07/16 0402 07/07/16 0709  WBC 12.0* PENDING  HGB 9.7* 8.9*  HCT 27.3* 25.2*  PLT 129* PENDING   BMET  Recent Labs  07/07/16 0708  NA 133*  K 3.5  CL 97*  CO2 24  GLUCOSE 127*  BUN 15  CREATININE 6.43*  CALCIUM 9.0    Studies/Results: Dg Tibia/fibula Left  Result Date: 06/30/2016 CLINICAL DATA:  Amputation wound, check for bony destruction EXAM: LEFT TIBIA AND FIBULA - 2 VIEW COMPARISON:  None. FINDINGS: Changes consistent with a below the knee amputation are seen. Mild osteopenia is noted. No bony erosion to suggest osteomyelitis is seen. IMPRESSION: No evidence of osteomyelitis. Electronically Signed   By: Inez Catalina M.D.   On: 06/30/2016 18:48   Dg Foot Complete Right  Result Date: 06/30/2016 CLINICAL DATA:  Open wound that will not heal since April. Wounds of the first digit and heel. EXAM: RIGHT FOOT COMPLETE - 3+ VIEW COMPARISON:  None. FINDINGS: The patient's heel wound is subtly visible. No soft tissue emphysema or opaque foreign body. No erosion to suggest osteomyelitis. Osteopenia and diffuse arterially calcification. IMPRESSION: No evidence of osteomyelitis or opaque foreign body. Electronically Signed   By: Monte Fantasia M.D.   On: 06/30/2016 13:41    Anti-infectives: Anti-infectives    Start     Dose/Rate Route Frequency Ordered Stop   07/01/16 0800  cefTRIAXone (ROCEPHIN) 2 g in dextrose 5 % 50 mL IVPB     2 g 100 mL/hr over 30 Minutes Intravenous Every 24 hours 06/30/16 2027     07/01/16 0800  metroNIDAZOLE (FLAGYL) IVPB 500 mg     500 mg 100 mL/hr over 60 Minutes Intravenous Every 8 hours 06/30/16 2027     06/30/16 2000  piperacillin-tazobactam (ZOSYN) IVPB 3.375 g  Status:  Discontinued     3.375 g 12.5 mL/hr over 240 Minutes Intravenous Every 12 hours 06/30/16 1925 06/30/16 2027      Assessment/Plan: s/p Procedure(s): Abdominal Aortogram w/Lower Extremity (N/A) 4 right below-knee amputation tomorrow with Dr. Scot Dock. Patient understands land to proceed.   LOS: 7 days   Curt Jews 07/07/2016, 8:47 AM

## 2016-07-08 NOTE — Progress Notes (Signed)
Nutrition Follow-up  DOCUMENTATION CODES:   Non-severe (moderate) malnutrition in context of chronic illness  INTERVENTION:   -Continue Pro-Stat BID -Continue Boost Breeze TID -Recommend continuing Regular diet post surgery -Continue Rena-Vit  NUTRITION DIAGNOSIS:   Malnutrition (mild) related to chronic illness (PAD) as evidenced by energy intake < 75% for > or equal to 1 month, mild depletion of body fat, moderate depletion of body fat, mild depletion of muscle mass, moderate depletions of muscle mass.   GOAL:   Patient will meet greater than or equal to 90% of their needs  MONITOR:   PO intake, Supplement acceptance, Weight trends, Labs, Skin, I & O's  REASON FOR ASSESSMENT:   Consult Wound healing  ASSESSMENT:   [name] is a 61 y.o. female with medical history significant for hypertension, diabetes, coronary artery disease, chronic back pain, depression, end-stage renal disease on HD, mechanical aortic valve on warfarin, and history of liver transplantation, H/o PAD s/p L BKA in 3/18 who presents to the emergency department for evaluation of wounds on the right foot and left BKA stump  Pt to OR today with R BKA. Pt in surgery on visit today  Noted episodes of hypoglycemia  Recorded po intake 50-100% of meals. Pt drinking multiple Boost Breeze per day per [facility] but does not appear that taking Prostat  Labs: CBGs 57-252, sodium 130, phosphorus 5.8, albumin 1.7, corrected calcium 10.84 (on low calcium bath) Meds: reglan, phoslo, calcitriol, Rena-Vit  Diet Order:  Diet renal/carb modified with fluid restriction Diet-HS Snack? Nothing; Room service appropriate? Yes; Fluid consistency: Thin  Skin:  Wound (see comment) (nonhealing wound lt BKA stump, st II coccyx, UN rt heel, rt )  Last BM:  07/07/16  Height:   Ht Readings from Last 1 Encounters:  06/30/16 5\' 4"  (1.626 m)    Weight:   Wt Readings from Last 1 Encounters:  07/07/16 124 lb 1.9 oz (56.3 kg)     Ideal Body Weight:  51 kg (adjusted for lt BKA)  BMI:  Body mass index is 21.3 kg/m.  Estimated Nutritional Needs:   Kcal:  1600-1800  Protein:  80-95 grams  Fluid:  per MD  EDUCATION NEEDS:   Education needs addressed  Kerman Passey MS, RD, LDN [phone] Pager  [phone] Weekend/On-Call Pager

## 2016-07-08 NOTE — Telephone Encounter (Signed)
Sched appt 08/06/16 at 11:15. Lm on hm# for pt to confirm appt.

## 2016-07-08 NOTE — Progress Notes (Signed)
[facility] Progress Note   Subjective:    Anxious about surgery today HD yesterday with no issues but minimal UF    Objective Vitals:   07/07/16 1122 07/07/16 1734 07/07/16 2052 07/08/16 0528  BP: (!) 129/52 (!) 128/54 (!) 126/53 (!) 126/58  Pulse: 75 75 78 77  Resp:  16 18 16   Temp:  97.8 F (36.6 C) 99.3 F (37.4 C) 98.1 F (36.7 C)  TempSrc:  Oral Oral Oral  SpO2:   99% 91%  Weight:   56.3 kg (124 lb 1.9 oz)   Height:       Physical Exam General: Sitting up in bed, nervous but NAD Heart: RRR Lungs: no rales Abdomen: soft NT. Scars from abd surgeries/liver xpl Extremities: left BKA dressing on poorly healing stump; right foot dark, mild edema, heel ulcer great toe black Dialysis Access: R IJ TTDC with dsg intact    Recent Labs Lab 07/02/16 0858 07/02/16 1430 07/04/16 0425 07/07/16 0708 07/08/16 0739  NA 139 134* 134* 133* 130*  K 3.6 3.1* 3.3* 3.5 4.4  CL 100* 97* 96* 97* 98*  CO2 27 28 25 24 23   GLUCOSE 110* 128* 156* 127* 104*  BUN 22* <5* 16 15 7   CREATININE 5.96* 2.50* 5.26* 6.43* 4.01*  CALCIUM 8.0* 7.4* 8.8* 9.0 9.1  PHOS 6.5* 2.8  --  5.8*  --      Recent Labs Lab 07/02/16 0858 07/02/16 1430 07/07/16 0708  ALBUMIN 1.7* 1.8* 1.7*     Recent Labs Lab 07/05/16 0433 07/06/16 0624 07/07/16 0402 07/07/16 0709 07/08/16 0739  WBC 11.1* 12.6* 12.0* 12.6* 16.5*  HGB 10.9* 9.7* 9.7* 8.9* 10.3*  HCT 32.1* 27.5* 27.3* 25.2* 29.6*  MCV 84.3 82.8 83.0 82.6 82.2  PLT 135* 128* 129* 130* 140*     Blood Culture    Component Value Date/Time   SDES BLOOD RIGHT HAND 06/30/2016 2247   SPECREQUEST  06/30/2016 2247    BOTTLES DRAWN AEROBIC AND ANAEROBIC Blood Culture adequate volume   CULT NO GROWTH 5 DAYS 06/30/2016 2247   REPTSTATUS 07/06/2016 FINAL 06/30/2016 2247     Recent Labs Lab 07/07/16 1425 07/07/16 1542 07/07/16 1736 07/07/16 2051 07/08/16 0805  GLUCAP 156* 142* 252* 189* 106*    Lab Results  Component Value  Date   INR 1.50 07/08/2016   INR 1.80 07/07/2016   INR 1.62 07/06/2016   Medications: .  ceFAZolin (ANCEF) IV    . cefTRIAXone (ROCEPHIN)  IV 2 g (07/08/16 0820)   And  . metronidazole 500 mg (07/08/16 0557)  . ferric gluconate (FERRLECIT/NULECIT) IV Stopped (07/05/16 1214)  . heparin 850 Units/hr (07/07/16 2301)   . aspirin EC  81 mg Oral Daily  . calcitRIOL  0.5 mcg Oral Q M,W,F-HD  . calcium acetate  667 mg Oral TID WC  . Chlorhexidine Gluconate Cloth  6 each Topical Daily  . darbepoetin (ARANESP) injection - DIALYSIS  60 mcg Intravenous Q Mon-HD  . docusate sodium  100 mg Oral Daily  . feeding supplement  1 Container Oral BID BM  . feeding supplement (PRO-STAT SUGAR FREE 64)  30 mL Oral BID  . levothyroxine  175 mcg Oral QAC breakfast  . metoCLOPramide  5 mg Oral BID AC  . metoprolol tartrate  25 mg Oral BID  . multivitamin  1 tablet Oral QHS  . mupirocin ointment  1 application Nasal BID  . polyethylene glycol  17 g Oral BID  . pregabalin  75 mg  Oral Daily  . senna-docusate  1 tablet Oral BID  . tacrolimus  2 mg Oral BID    Dialysis Orders:  [facility] MWF - right IJ 3.5h 180F  BFR 450/A1.5x  2K/2Ca  Na Linear  EDW 50 kg No Heparin -Calcitriol 0.5 mcg PO q HD -Mircera 50 mcg IV q 4 weeks   Background: 61 y.o.femalewith ESRD on HD, HTN, DM, CAD, chronic back pain, 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 Scot Dock. 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 Arteriogram no surgically correctable dx. For amputation 5/29.    Assessment/Recommendations 1. PAD/R foot gangrene/recent L BKA (not healed) - empiric abx (Rocephn and flagyl) per primary For R BKA today  2. ESRD - MWF  Use 3K bath  3. Anemia - Hgb 10.3 - start weekly Fe on 5/26; redose Aranesp 60 5/27 - hgb will drop with surgery 4. Metabolic bone disease - Cont VDRA/Ca acetate binder  5. Severe protein  malnutrition - Albumin 1.8 renal diet/vitamins/prostat/Boost - on regular diet to promote intake- intake poor 6. S/p mechanical AVR -  anticoagulation per pharmacy 7. H/o liver transplant - on Prograf 8. DM - per primary  9.   BP/volume - BP ok above edw - cannot do standing wts - unclear why minimal UF with HD yesterday - attempt 1.5-2L next HD anticipate lower EDW s/p BKA   [name] PA-C [facility] Pager [phone] 07/08/2016,9:21 AM   Renal Attending:  Going for amputation today which will hopefully improve co morbidities. Rolene Andrades C

## 2016-07-08 NOTE — Transfer of Care (Signed)
Immediate Anesthesia Transfer of Care Note  Patient: [name]  Procedure(s) Performed: Procedure(s): AMPUTATION BELOW KNEE (Right)  Patient Location: PACU  Anesthesia Type:General  Level of Consciousness: sedated  Airway & Oxygen Therapy: Patient connected to nasal cannula oxygen  Post-op Assessment: Post -op Vital signs reviewed and stable  Post vital signs: stable  Last Vitals:  Vitals:   07/08/16 0528 07/08/16 1023  BP: (!) 126/58 (!) 139/58  Pulse: 77 73  Resp: 16 18  Temp: 36.7 C 36.7 C    Last Pain:  Vitals:   07/08/16 1030  TempSrc:   PainSc: 5       Patients Stated Pain Goal: 0 (55/21/74 7159)  Complications: No apparent anesthesia complications

## 2016-07-08 NOTE — Anesthesia Preprocedure Evaluation (Signed)
Anesthesia Evaluation  Patient identified by MRN, date of birth, ID band Patient awake    Reviewed: Allergy & Precautions, NPO status , Patient's Chart, lab work & pertinent test results  Airway Mallampati: II  TM Distance: >3 FB     Dental   Pulmonary asthma , pneumonia, COPD, Current Smoker,    breath sounds clear to auscultation       Cardiovascular hypertension, + CAD, + Peripheral Vascular Disease and +CHF   Rhythm:Regular Rate:Normal     Neuro/Psych  Headaches,    GI/Hepatic GERD  ,(+) Hepatitis -  Endo/Other  diabetesHypothyroidism   Renal/GU Renal disease     Musculoskeletal   Abdominal   Peds  Hematology   Anesthesia Other Findings   Reproductive/Obstetrics                             Anesthesia Physical Anesthesia Plan  ASA: III  Anesthesia Plan: General   Post-op Pain Management:    Induction: Intravenous  Airway Management Planned: LMA  Additional Equipment:   Intra-op Plan:   Post-operative Plan: Extubation in OR  Informed Consent: I have reviewed the patients History and Physical, chart, labs and discussed the procedure including the risks, benefits and alternatives for the proposed anesthesia with the patient or authorized representative who has indicated his/her understanding and acceptance.   Dental advisory given  Plan Discussed with: Anesthesiologist and CRNA  Anesthesia Plan Comments:         Anesthesia Quick Evaluation

## 2016-07-08 NOTE — Anesthesia Procedure Notes (Signed)
Procedure Name: LMA Insertion Date/Time: 07/08/2016 2:01 PM Performed by: Lavell Luster Pre-anesthesia Checklist: Patient identified, Emergency Drugs available, Suction available, Patient being monitored and Timeout performed Patient Re-evaluated:Patient Re-evaluated prior to inductionOxygen Delivery Method: Circle system utilized Preoxygenation: Pre-oxygenation with 100% oxygen Intubation Type: IV induction Ventilation: Mask ventilation without difficulty LMA: LMA inserted LMA Size: 4.0 Number of attempts: 1 Placement Confirmation: positive ETCO2 and breath sounds checked- equal and bilateral Tube secured with: Tape Dental Injury: Teeth and Oropharynx as per pre-operative assessment

## 2016-07-08 NOTE — Interval H&P Note (Signed)
History and Physical Interval Note:  07/08/2016 12:20 PM  [name]  has presented today for surgery, with the diagnosis of right foot ishemic  The various methods of treatment have been discussed with the patient and family. After consideration of risks, benefits and other options for treatment, the patient has consented to  Procedure(s): AMPUTATION BELOW KNEE (Right) as a surgical intervention .  The patient's history has been reviewed, patient examined, no change in status, stable for surgery.  I have reviewed the patient's chart and labs.  Questions were answered to the patient's satisfaction.     [name]

## 2016-07-08 NOTE — Progress Notes (Signed)
[facility] for heparin Indication: mechanical AVR   Allergies  Allergen Reactions  . Acetaminophen Other (See Comments)    Liver transplant recipient   . Codeine Itching  . Mirtazapine Other (See Comments)    hallucination    Patient Measurements: Height: 5\' 4"  (162.6 cm) Weight: 124 lb 1.9 oz (56.3 kg) IBW/kg (Calculated) : 54.7 Heparin Dosing Weight: 52.6 kg  Vital Signs: Temp: 98.1 F (36.7 C) (05/29 1023) Temp Source: Oral (05/29 1023) BP: 139/58 (05/29 1023) Pulse Rate: 73 (05/29 1023)  Labs:  Recent Labs  07/06/16 3845 07/07/16 0402 07/07/16 0708 07/07/16 0709 07/08/16 0739  HGB 9.7* 9.7*  --  8.9* 10.3*  HCT 27.5* 27.3*  --  25.2* 29.6*  PLT 128* 129*  --  130* 140*  LABPROT 19.5* 21.1*  --   --  18.3*  INR 1.62 1.80  --   --  1.50  HEPARINUNFRC 0.34 0.37  --   --  0.27*  CREATININE  --   --  6.43*  --  4.01*    Estimated Creatinine Clearance: 12.7 mL/min (A) (by C-G formula based on SCr of 4.01 mg/dL (H)).   Assessment: 61yo F with mechanical AVR on warfarin PTA which is being held for right BKA today. She continues on IV heparin. Heparin level is subtherapeutic today at 0.27 on 850 units/hr. Spoke with RN and no problems with infusion. CBC low, but stable. No active bleeding per notes.   Goal of Therapy:  Heparin level 0.3-0.7 units/ml Monitor platelets by anticoagulation protocol: Yes   Plan:  Increase heparin drip to 950 units/hr Daily heparin level and CBC Monitor for s/sx of bleeding F/U after OR today Please advise pharmacy when safe to resume warfarin post-op   Renold Genta, PharmD, BCPS Clinical Pharmacist Phone for today - [place] - [phone] 07/08/2016 10:50 AM

## 2016-07-09 ENCOUNTER — Encounter (HOSPITAL_COMMUNITY): Payer: Self-pay | Admitting: Vascular Surgery

## 2016-07-09 DIAGNOSIS — I10 Essential (primary) hypertension: Secondary | ICD-10-CM

## 2016-07-09 DIAGNOSIS — Z944 Liver transplant status: Secondary | ICD-10-CM

## 2016-07-09 DIAGNOSIS — S88111D Complete traumatic amputation at level between knee and ankle, right lower leg, subsequent encounter: Secondary | ICD-10-CM

## 2016-07-09 DIAGNOSIS — Z89511 Acquired absence of right leg below knee: Secondary | ICD-10-CM

## 2016-07-09 DIAGNOSIS — Z89512 Acquired absence of left leg below knee: Secondary | ICD-10-CM

## 2016-07-09 DIAGNOSIS — G8918 Other acute postprocedural pain: Secondary | ICD-10-CM

## 2016-07-09 LAB — CBC
HCT: 26.2 % — ABNORMAL LOW (ref 36.0–46.0)
HCT: 24.1 % — ABNORMAL LOW (ref 36.0–46.0)
Hemoglobin: 9.2 g/dL — ABNORMAL LOW (ref 12.0–15.0)
Hemoglobin: 8.4 g/dL — ABNORMAL LOW (ref 12.0–15.0)
MCH: 29.2 pg (ref 26.0–34.0)
MCH: 28.6 pg (ref 26.0–34.0)
MCHC: 35.1 g/dL (ref 30.0–36.0)
MCHC: 34.9 g/dL (ref 30.0–36.0)
MCV: 83.2 fL (ref 78.0–100.0)
MCV: 82 fL (ref 78.0–100.0)
PLATELETS: 154 10*3/uL (ref 150–400)
PLATELETS: 135 10*3/uL — AB (ref 150–400)
RBC: 3.15 MIL/uL — AB (ref 3.87–5.11)
RBC: 2.94 MIL/uL — ABNORMAL LOW (ref 3.87–5.11)
RDW: 19 % — ABNORMAL HIGH (ref 11.5–15.5)
RDW: 18.2 % — ABNORMAL HIGH (ref 11.5–15.5)
WBC: 16.1 10*3/uL — AB (ref 4.0–10.5)
WBC: 20.6 10*3/uL — ABNORMAL HIGH (ref 4.0–10.5)

## 2016-07-09 LAB — RENAL FUNCTION PANEL
Albumin: 1.8 g/dL — ABNORMAL LOW (ref 3.5–5.0)
Anion gap: 9 (ref 5–15)
BUN: 12 mg/dL (ref 6–20)
CHLORIDE: 97 mmol/L — AB (ref 101–111)
CO2: 24 mmol/L (ref 22–32)
CREATININE: 5.28 mg/dL — AB (ref 0.44–1.00)
Calcium: 9 mg/dL (ref 8.9–10.3)
GFR calc non Af Amer: 8 mL/min — ABNORMAL LOW (ref >60)
GFR, EST AFRICAN AMERICAN: 9 mL/min — AB (ref >60)
Glucose, Bld: 151 mg/dL — ABNORMAL HIGH (ref 65–99)
POTASSIUM: 4.2 mmol/L (ref 3.5–5.1)
Phosphorus: 5.1 mg/dL — ABNORMAL HIGH (ref 2.5–4.6)
Sodium: 130 mmol/L — ABNORMAL LOW (ref 135–145)

## 2016-07-09 LAB — GLUCOSE, CAPILLARY
GLUCOSE-CAPILLARY: 162 mg/dL — AB (ref 65–99)
Glucose-Capillary: 92 mg/dL (ref 65–99)
Glucose-Capillary: 140 mg/dL — ABNORMAL HIGH (ref 65–99)
Glucose-Capillary: 240 mg/dL — ABNORMAL HIGH (ref 65–99)
Glucose-Capillary: 234 mg/dL — ABNORMAL HIGH (ref 65–99)

## 2016-07-09 LAB — HEPARIN LEVEL (UNFRACTIONATED)
HEPARIN UNFRACTIONATED: 0.49 [IU]/mL (ref 0.30–0.70)
Heparin Unfractionated: 0.2 IU/mL — ABNORMAL LOW (ref 0.30–0.70)

## 2016-07-09 LAB — PROTIME-INR
INR: 1.6
Prothrombin Time: 19.3 seconds — ABNORMAL HIGH (ref 11.4–15.2)

## 2016-07-09 MED ORDER — WARFARIN - PHARMACIST DOSING INPATIENT: Freq: Every day | Status: DC

## 2016-07-09 MED ORDER — WARFARIN SODIUM 2.5 MG PO TABS
2.5000 mg | ORAL_TABLET | Freq: Once | ORAL | Status: AC
Start: 2016-07-09 — End: 2016-07-09
  Administered 2016-07-09: 2.5 mg via ORAL
  Filled 2016-07-09: qty 1

## 2016-07-09 MED ORDER — CALCITRIOL 0.5 MCG PO CAPS
ORAL_CAPSULE | ORAL | Status: AC
  Administered 2016-07-09: 0.5 ug via ORAL
  Filled 2016-07-09: qty 1

## 2016-07-09 MED ORDER — MORPHINE SULFATE (PF) 4 MG/ML IV SOLN
INTRAVENOUS | Status: AC
  Filled 2016-07-09: qty 1

## 2016-07-09 MED ORDER — HYDROMORPHONE HCL 2 MG PO TABS
2.0000 mg | ORAL_TABLET | Freq: Four times a day (QID) | ORAL | Status: DC | PRN
  Administered 2016-07-09: 2 mg via ORAL
  Administered 2016-07-10 (×2): 4 mg via ORAL
  Filled 2016-07-09 (×2): qty 2
  Filled 2016-07-09: qty 1
  Filled 2016-07-09: qty 2

## 2016-07-09 MED ORDER — OXYCODONE HCL 5 MG PO TABS
ORAL_TABLET | ORAL | Status: AC
  Administered 2016-07-09: 5 mg via ORAL
  Filled 2016-07-09: qty 1

## 2016-07-09 MED ORDER — BOOST / RESOURCE BREEZE PO LIQD
1.0000 | Freq: Three times a day (TID) | ORAL | Status: DC
  Administered 2016-07-09 – 2016-07-10 (×3): 1 via ORAL

## 2016-07-09 NOTE — Evaluation (Signed)
Occupational Therapy Evaluation Patient Details Name: [name] MRN: [id number] DOB: December 02, 1955 Today's Date: 07/09/2016    History of Present Illness [name] a 61 y.o.femalewith medical history significant for hypertension, diabetes, coronary artery disease, chronic back pain, depression, end-stage renal disease on HD, mechanical aortic valve on warfarin, and history of liver transplantation, H/o PAD s/p L BKA in 3/18who presents to the emergency department for evaluation of wounds on the right foot and left BKA stump; now s/p R BKA   Clinical Impression   Pt reports her daughter was assisting some with ADL PTA but pt was able to perform stand pivot transfers to [facility] and toilet without physical assist. Currently pt requiring max-total assist +2 with AP transfer to [facility], min assist for UB ADL in sitting, and total assist for LB ADL at bed level. Pt presenting with impaired balance, generalized weakness, and pain impacting her independence and safety with ADL and functional mobility. Recommending CIR level therapies to maximize independence and safety with ADL and functional mobility prior to return home. Pt would benefit from continued skilled OT to address established goals.    Follow Up Recommendations  CIR;Supervision/Assistance - 24 hour    Equipment Recommendations  Tub/shower bench    Recommendations for Other Services Rehab consult     Precautions / Restrictions Precautions Precautions: Fall Restrictions Weight Bearing Restrictions: Yes RLE Weight Bearing: Non weight bearing LLE Weight Bearing: Non weight bearing      Mobility Bed Mobility Overal bed mobility: Needs Assistance Bed Mobility: Supine to Sit;Sit to Supine     Supine to sit: Max assist Sit to supine: Mod assist;HOB elevated   General bed mobility comments: Assist bring hips around to EOB and for trunk elevation to sitting. Pt using bed rail to assist with pulling self up into  sitting  Transfers Overall transfer level: Needs assistance   Transfers: Comptroller transfers: Max assist;Total assist;+2 physical assistance   General transfer comment: Max assist +2 for transfer to [facility], total assist +2 for return to bed due to fatigue. Cues throughout for weight shift and technique    Balance Overall balance assessment: Needs assistance Sitting-balance support: No upper extremity supported;Feet unsupported Sitting balance-Leahy Scale: Good Sitting balance - Comments: Pt able to perform RLE desentisization with bil UEs sitting EOB Postural control: Posterior lean                                 ADL either performed or assessed with clinical judgement   ADL Overall ADL's : Needs assistance/impaired Eating/Feeding: Set up;Bed level   Grooming: Min guard;Sitting   Upper Body Bathing: Minimal assistance;Sitting   Lower Body Bathing: Total assistance;Bed level   Upper Body Dressing : Minimal assistance;Sitting   Lower Body Dressing: Total assistance;Bed level   Toilet Transfer: Maximal assistance;Total assistance;+2 for physical assistance;Anterior/posterior;[employer] Armed forces technical officer Details (indicate cue type and reason): Max assist to [facility], total assist for return to bed         Functional mobility during ADLs: Maximal assistance;Total assistance;+2 for physical assistance (for AP transfer)       Vision         Perception     Praxis      Pertinent Vitals/Pain Pain Assessment: 0-10 Pain Score: 10-Worst pain ever Pain Location: RLE Pain Descriptors / Indicators: Aching;Sore Pain Intervention(s): Monitored during session;Repositioned;Premedicated before session     Hand Dominance  Right   Extremity/Trunk Assessment Upper Extremity Assessment Upper Extremity Assessment: Generalized weakness   Lower Extremity Assessment Lower Extremity Assessment: Defer to PT evaluation    Cervical /  Trunk Assessment Cervical / Trunk Assessment: Normal   Communication Communication Communication: No difficulties   Cognition Arousal/Alertness: Awake/alert Behavior During Therapy: WFL for tasks assessed/performed Overall Cognitive Status: Within Functional Limits for tasks assessed                                    General Comments       Exercises     Shoulder Instructions      Home Living Family/patient expects to be discharged to:: Inpatient rehab Living Arrangements: Children Available Help at Discharge: Family;Available PRN/intermittently Type of Home: Mobile home Home Access: Ramped entrance     Home Layout: One level     Bathroom Shower/Tub: Teacher, early years/pre: Handicapped height Bathroom Accessibility: Yes How Accessible: Accessible via wheelchair Home Equipment: Bedside commode;Walker - 2 wheels;Wheelchair - manual   Additional Comments: A ramp has been built for her to enter home      Prior Functioning/Environment Level of Independence: Needs assistance  Gait / Transfers Assistance Needed: w/c for mobility. daughter assisting with pivoting to w/c and toilet ADL's / Homemaking Assistance Needed: daughter assisting with ADL. only sponge bathing at this time            OT Problem List: Decreased strength;Decreased activity tolerance;Impaired balance (sitting and/or standing);Decreased knowledge of use of DME or AE;Decreased knowledge of precautions;Pain      OT Treatment/Interventions: Self-care/ADL training;Therapeutic exercise;Energy conservation;DME and/or AE instruction;Therapeutic activities;Patient/family education;Balance training    OT Goals(Current goals can be found in the care plan section) Acute Rehab OT Goals Patient Stated Goal: to decrease pain OT Goal Formulation: With patient Time For Goal Achievement: 07/23/16 Potential to Achieve Goals: Good ADL Goals Pt Will Perform Lower Body Bathing: with min  assist;sitting/lateral leans Pt Will Perform Lower Body Dressing: with min assist;sitting/lateral leans Pt Will Transfer to Toilet: with min assist;with transfer board;anterior/posterior transfer;bedside commode Pt Will Perform Toileting - Clothing Manipulation and hygiene: with min assist;sitting/lateral leans Additional ADL Goal #1: Pt will perform bed mobility with min assist as precursor to ADL and functional mobility.  OT Frequency: Min 2X/week   Barriers to D/C:            Co-evaluation PT/OT/SLP Co-Evaluation/Treatment: Yes Reason for Co-Treatment: Complexity of the patient's impairments (multi-system involvement);For patient/therapist safety;To address functional/ADL transfers PT goals addressed during session: Mobility/safety with mobility;Strengthening/ROM OT goals addressed during session: ADL's and self-care      AM-PAC PT "6 Clicks" Daily Activity     Outcome Measure Help from another person eating meals?: None Help from another person taking care of personal grooming?: A Little Help from another person toileting, which includes using toliet, bedpan, or urinal?: A Lot Help from another person bathing (including washing, rinsing, drying)?: A Lot Help from another person to put on and taking off regular upper body clothing?: A Little Help from another person to put on and taking off regular lower body clothing?: Total 6 Click Score: 15   End of Session    Activity Tolerance: Patient tolerated treatment well;Patient limited by pain Patient left: in bed;with call bell/phone within reach;with family/visitor present  OT Visit Diagnosis: Other abnormalities of gait and mobility (R26.89);Pain Pain - Right/Left: Right Pain - part of body: Leg  Time: 8250-5397 OT Time Calculation (min): 41 min Charges:  OT General Charges $OT Visit: 1 Procedure OT Evaluation $OT Eval Moderate Complexity: 1 Procedure G-Codes:     Afton Lavalle A. Ulice Brilliant, M.S., OTR/L Pager:  [place] 07/09/2016, 5:19 PM

## 2016-07-09 NOTE — Progress Notes (Signed)
ANTICOAGULATION CONSULT NOTE - Follow-Up  Pharmacy Consult for Heparin + Warfarin Indication: mechanical AVR   Allergies  Allergen Reactions  . Acetaminophen Other (See Comments)    Liver transplant recipient   . Codeine Itching  . Mirtazapine Other (See Comments)    hallucination    Patient Measurements: Height: 5\' 4"  (162.6 cm) Weight: 123 lb 3.8 oz (55.9 kg) IBW/kg (Calculated) : 54.7 Heparin Dosing Weight: 52.6 kg  Vital Signs: Temp: 98.5 F (36.9 C) (05/30 1831) Temp Source: Oral (05/30 1831) BP: 117/61 (05/30 1831) Pulse Rate: 85 (05/30 1831)  Labs:  Recent Labs  07/07/16 0402 07/07/16 0708  07/08/16 0739 07/09/16 0338 07/09/16 0938 07/09/16 1815  HGB 9.7*  --   < > 10.3* 9.2* 8.4*  --   HCT 27.3*  --   < > 29.6* 26.2* 24.1*  --   PLT 129*  --   < > 140* 154 135*  --   LABPROT 21.1*  --   --  18.3* 19.3*  --   --   INR 1.80  --   --  1.50 1.60  --   --   HEPARINUNFRC 0.37  --   --  0.27* 0.20*  --  0.49  CREATININE  --  6.43*  --  4.01*  --  5.28*  --   < > = values in this interval not displayed.  Estimated Creatinine Clearance: 9.7 mL/min (A) (by C-G formula based on SCr of 5.28 mg/dL (H)).   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 Heparin resumed post-op on 5/29 .  Warfarin restarted today 07/09/16.  Post-op ABLA noted with Hgb drop to 8.4 << 9.2 << 10.3. Will continue to watch.  Heparin level tonight is 0.49, therapeutic on heparin drip rate 1050 units/hr.   Goal of Therapy:  Heparin level 0.3-0.7 units/ml INR 2.5-3.5 Monitor platelets by anticoagulation protocol: Yes   Plan:  Continue Heparin at 1050 units/hr (10.5 ml/hr)  Daily PT/INR, HL, CBC q72h Will continue to monitor for any signs/symptoms of bleeding.  Thank you for allowing pharmacy to be a part of this patient's care.  Nicole Cella, RPh Clinical Pharmacist Pager: [phone] Clinical phone for 07/09/2016 from 4p-1030p: [phone] If after  10:30p, please call main pharmacy at: [phone] 07/09/2016 8:22 PM

## 2016-07-09 NOTE — Procedures (Signed)
Resting.  Tol HD currently post op.  Will need a new EDW due to limb loss. She has some SOM so will challenge some. Abhiraj Dozal C

## 2016-07-09 NOTE — Progress Notes (Signed)
ANTICOAGULATION CONSULT NOTE - Follow-Up  Pharmacy Consult for heparin Indication: mechanical AVR   Allergies  Allergen Reactions  . Acetaminophen Other (See Comments)    Liver transplant recipient   . Codeine Itching  . Mirtazapine Other (See Comments)    hallucination    Patient Measurements: Height: 5\' 4"  (162.6 cm) Weight: 125 lb (56.7 kg) IBW/kg (Calculated) : 54.7 Heparin Dosing Weight: 52.6 kg  Vital Signs: Temp: 98.7 F (37.1 C) (05/30 0504) Temp Source: Oral (05/30 0504) BP: 120/55 (05/30 0504) Pulse Rate: 85 (05/30 0504)  Labs:  Recent Labs  07/07/16 0402 07/07/16 0708 07/07/16 0709 07/08/16 0739 07/09/16 0338  HGB 9.7*  --  8.9* 10.3* 9.2*  HCT 27.3*  --  25.2* 29.6* 26.2*  PLT 129*  --  130* 140* 154  LABPROT 21.1*  --   --  18.3* 19.3*  INR 1.80  --   --  1.50 1.60  HEPARINUNFRC 0.37  --   --  0.27* 0.20*  CREATININE  --  6.43*  --  4.01*  --     Estimated Creatinine Clearance: 12.7 mL/min (A) (by C-G formula based on SCr of 4.01 mg/dL (H)).   Assessment: 61 yo F with mechanical AVR on warfarin PTA which was held for right BKA 5/29 - resumed heparin post-op. Heparin level subtherapeutic (0.2) on gtt at 950 units/hr. Hgb low but stable. No issues with line or bleeding reported per RN.   Goal of Therapy:  Heparin level 0.3-0.7 units/ml Monitor platelets by anticoagulation protocol: Yes   Plan:  Increase heparin drip to 1050 units/hr Will f/u 8 hr heparin level  Sherlon Handing, PharmD, BCPS Clinical pharmacist, pager [phone] 07/09/2016 5:28 AM

## 2016-07-09 NOTE — Consult Note (Signed)
Physical Medicine and Rehabilitation Consult Reason for Consult: Decreased functional mobility Referring Physician: [facility]   HPI: [name] is a 61 y.o. 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 She was discharged to home with home health therapies. Per chart review patient lives with daughter. Essentially wheelchair bound prior to admission. She has yet to receive a prosthesis as prior BKA is healing. One level home with rehabilitation. Presented 06/30/2016 with gangrenous right heel and right great toe with findings of infrainguinal arterial occlusive disease. No change with conservative care and underwent right below knee amputation 07/08/2016 per Dr. [name]. Hospital course pain management. Acute on chronic anemia 8.4 and monitor. Leukocytosis 20,600. Presently on intravenous heparin and await plan to resume chronic Coumadin. Hemodialysis ongoing as per renal services. Therapy evaluations pending. M.D. has requested physical medicine rehabilitation consult.   Review of Systems  Constitutional: Negative for chills and fever.  HENT: Negative for hearing loss.   Eyes: Negative for blurred vision and double vision.  Respiratory: Positive for cough and shortness of breath.   Cardiovascular: Positive for palpitations and leg swelling. Negative for chest pain.  Gastrointestinal: Positive for constipation. Negative for nausea and vomiting.       GERD  Musculoskeletal: Positive for back pain and myalgias.  Skin: Negative for rash.  Neurological: Positive for weakness and headaches. Negative for seizures.  Psychiatric/Behavioral:       Anxiety  All other systems reviewed and are negative.  Past Medical History:  Diagnosis Date  . Anemia      takes Folic Acid daily  . Anxiety   . Aortic stenosis   . Arthritis    "left hand, back" (08/30/2012)  . Asthma   . CAD (coronary artery disease)   . CAD (coronary artery disease) Jan. 2015   Cath: 20% LAD, 50% D1; s/p LIMA-LAD  . Chest pain 03/06/2015  . CHF (congestive heart failure) ([place]) 05/2016  . Chronic back pain   . Chronic bronchitis ([place])    "q yr w/season changes" (08/30/2012)  . Chronic constipation    takes MIralax and Colace daily  . COPD (chronic obstructive pulmonary disease) ([place])   . Depression    takes Cymbalta for "severe" depression  . End stage renal disease on dialysis [facility]) 02/27/2011   Kidneys shut down at time of liver transplant in Sept 2011 at [facility] in [place], she has been on HD ever since.  Dialyzes at [facility] on TTS schedule.  Had L forearm graft used 10 months then removed Dec 2012 due to suspected infection.  A right upper arm AVG was placed Dec 2012 but she developed steal symptoms acutely and it was ligated the same day.  Never had an AV fistula due to small veins.  Now has L thigh AVG put in Jan 2013, has not clotted to date.    [name] GERD (gastroesophageal reflux disease)    takes Omeprazole daily  . Headache    "at least monthly" (08/30/2012)  . Hepatitis C   . History of blood transfusion    "several" (08/30/2012)  . Hypertension    takes Metoprolol and Lisinopril daily, sees Dr Bea Graff  . Hypothyroidism    takes Synthroid daily  . Migraine    "last migraine was in 2013" (08/30/2012)  . Neuromuscular disorder (  [place])    carpal tunnel in right hand  . Obesity   . Peripheral vascular disease (HCC) hands and legs  . Pneumonia    "today and several times before" (08/30/2012)  . 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 S/P unilateral BKA (below knee amputation), left ([place]) 04/30/2016  . SVT (supraventricular  tachycardia) ([place]) 06/09/14  . Tobacco abuse   . Type II diabetes mellitus (HCC)    Levemir 2units daily if > 150   Past Surgical History:  Procedure Laterality Date  . ABDOMINAL AORTOGRAM W/LOWER EXTREMITY N/A 07/04/2016   Procedure: Abdominal Aortogram w/Lower Extremity;  Surgeon: Elam Dutch, MD;  Location: [place] [facility];  Service: Cardiovascular;  Laterality: N/A;  . AMPUTATION Left 04/24/2016   Procedure: AMPUTATION BELOW KNEE;  Surgeon: Angelia Mould, MD;  Location: [place];  Service: Vascular;  Laterality: Left;  . AMPUTATION Right 07/08/2016   Procedure: AMPUTATION BELOW KNEE;  Surgeon: Angelia Mould, MD;  Location: [place];  Service: Vascular;  Laterality: Right;  . AORTIC VALVE REPLACEMENT N/A 03/15/2013   AVR; Surgeon: Ivin Poot, MD;  Location: [facility] OR; Open Heart Surgery;  20mm[employer] mechanical prosthesis, top hat valve  . ARTERIOVENOUS GRAFT PLACEMENT Left 10/03/10    forearm  . AV FISTULA PLACEMENT  01/29/2011   Procedure: INSERTION OF ARTERIOVENOUS (AV) GORE-TEX GRAFT ARM;  Surgeon: Elam Dutch, MD;  [facility];  Service: Vascular;  Laterality: Right;  . AV FISTULA PLACEMENT  03/10/2011   Procedure: INSERTION OF ARTERIOVENOUS (AV) GORE-TEX GRAFT THIGH;  Surgeon: Elam Dutch, MD;  Location: [place];  Service: Vascular;  Laterality: Left;  . [place] REMOVAL  12/23/2010   Procedure: REMOVAL OF ARTERIOVENOUS GORETEX GRAFT ([place]);  Surgeon: Elam Dutch, MD;  Location: [place];  Service: Vascular;  Laterality: Left;  procedure started @1736 -1852  . CHOLECYSTECTOMY  1993  . CORONARY ARTERY BYPASS GRAFT N/A 03/15/2013   Procedure: CORONARY ARTERY BYPASS GRAFTING (CABG) times one using left internal mammary artery.;  Surgeon: Ivin Poot, MD;  Location: [place];  Service: Open Heart Surgery;  Laterality: N/A;  POSS CABG X 1  . CYSTOSCOPY  1990's  . INSERTION OF DIALYSIS CATHETER  12/23/2010   Procedure: INSERTION OF DIALYSIS CATHETER;   Surgeon: Elam Dutch, MD;  Location: [place];  Service: Vascular;  Laterality: Right;  Right Internal Jugular 28cm dialysis catheter insertion procedure time 1701-1720   . INSERTION OF DIALYSIS CATHETER Right 03/11/2016   Procedure: INSERTION OF DIALYSIS CATHETER;  Surgeon: Angelia Mould, MD;  Location: [place];  Service: Vascular;  Laterality: Right;  . INTRAOPERATIVE TRANSESOPHAGEAL ECHOCARDIOGRAM N/A 03/15/2013   Procedure: INTRAOPERATIVE TRANSESOPHAGEAL ECHOCARDIOGRAM;  Surgeon: Ivin Poot, MD;  Location: [place];  Service: Open Heart Surgery;  Laterality: N/A;  . LEFT HEART CATHETERIZATION WITH CORONARY ANGIOGRAM N/A 07/29/2012   Procedure: LEFT HEART CATHETERIZATION WITH CORONARY ANGIOGRAM;  Surgeon: Troy Sine, MD;  Location[facility];  Service: Cardiovascular;  Laterality: N/A;  . LEFT HEART CATHETERIZATION WITH CORONARY ANGIOGRAM N/A 03/10/2013   Procedure: LEFT HEART CATHETERIZATION WITH CORONARY ANGIOGRAM;  Surgeon: Troy Sine, MD;  Location[facility] CATH LAB;  Service: Cardiovascular;  Laterality: N/A;  . LEFT HEART CATHETERIZATION WITH CORONARY/GRAFT ANGIOGRAM N/A 12/24/2011   Procedure: LEFT HEART CATHETERIZATION WITH  Beatrix Fetters;  Surgeon: [name], MD;  Location: [facility] CATH LAB;  Service: Cardiovascular;  Laterality: N/A;  . LEFT HEART CATHETERIZATION WITH CORONARY/GRAFT ANGIOGRAM N/A 12/16/2013   Procedure: LEFT HEART CATHETERIZATION WITH Beatrix Fetters;  Surgeon: Troy Sine, MD;  Location: [facility] CATH LAB;  Service: Cardiovascular;  Laterality: N/A;  . LIGATION ARTERIOVENOUS GORTEX GRAFT Left 03/11/2016   Procedure: LIGATION THIGH ARTERIOVENOUS GORTEX GRAFT;  Surgeon: Angelia Mould, MD;  Location: [place];  Service: Vascular;  Laterality: Left;  . LIVER TRANSPLANT  10/25/2009   sees Dr Ferol Luz 1 every 6 months, saw last in Dec 2013. [name] Coord [phone]  . PERIPHERAL VASCULAR CATHETERIZATION N/A 11/06/2015   Procedure:  Abdominal Aortogram;  Surgeon: Serafina Mitchell, MD;  Location: [place] [facility];  Service: Cardiovascular;  Laterality: N/A;  . PERIPHERAL VASCULAR CATHETERIZATION N/A 11/06/2015   Procedure: Lower Extremity Angiography;  Surgeon: Serafina Mitchell, MD;  Location: [place] [facility];  Service: Cardiovascular;  Laterality: N/A;  . PERIPHERAL VASCULAR CATHETERIZATION  11/06/2015   Procedure: Peripheral Vascular Atherectomy;  Surgeon: Serafina Mitchell, MD;  Location: [place] CV LAB;  Service: Cardiovascular;;  Left Superficial femoral  . SHUNTOGRAM Left 05/15/2014   Procedure: SHUNTOGRAM;  Surgeon: Conrad Minturn, MD;  Location: [facility] CATH LAB;  Service: Cardiovascular;  Laterality: Left;  . SMALL INTESTINE SURGERY  90's  . SPINAL GROWTH RODS  2010   "put 2 metal rods in my back; they had detetriorated" (08/30/2012)  . THROMBECTOMY    . THROMBECTOMY AND REVISION OF ARTERIOVENTOUS (AV) GORETEX  GRAFT Left 03/30/2014  . THROMBECTOMY AND REVISION OF ARTERIOVENTOUS (AV) GORETEX  GRAFT Left 03/30/2014   Procedure: THROMBECTOMY AND REVISION OF ARTERIOVENTOUS (AV) GORETEX  GRAFT;  Surgeon: Conrad Churchill, MD;  Location: [place];  Service: Vascular;  Laterality: Left;  . TUBAL LIGATION  1990's   Family History  Problem Relation Age of Onset  . Cancer Mother   . Diabetes Mother   . Hypertension Mother   . Stroke Mother   . Cancer Father   . Anesthesia problems Neg Hx   . Hypotension Neg Hx   . Malignant hyperthermia Neg Hx   . Pseudochol deficiency Neg Hx    Social History:  reports that she has been smoking Cigarettes.  She has a 20.00 pack-year smoking history. She has never used smokeless tobacco. She reports that she does not drink alcohol or use drugs. Allergies:  Allergies  Allergen Reactions  . Acetaminophen Other (See Comments)    Liver transplant recipient   . Codeine Itching  . Mirtazapine Other (See Comments)    hallucination   Medications Prior to Admission  Medication Sig Dispense Refill    . aspirin EC 81 MG tablet Take 81 mg by mouth daily at 12 noon.     . Calcium Carbonate Antacid (TUMS PO) Take 1 tablet by mouth daily with lunch.    . clindamycin (CLEOCIN) 150 MG capsule Take 150 mg by mouth 3 (three) times daily. 7 day course started 06/29/16 pm    . collagenase (SANTYL) ointment Apply 1 application topically daily.    [name] levothyroxine (SYNTHROID, LEVOTHROID) 175 MCG tablet Take 175 mcg by mouth daily at 12 noon.    . methocarbamol (ROBAXIN) 500 MG tablet Take 0.5 tablets (250 mg total) by mouth every 6 (six) hours as needed for muscle spasms. 30 tablet 0  . metoCLOPramide (REGLAN) 5 MG tablet Take 1 tablet (5 mg total) by  mouth 2 (two) times daily before a meal. Decrease to once a day before breakfast after a couple of weeks. (Patient taking differently: Take 5 mg by mouth daily before lunch. ) 30 tablet 0  . metoprolol tartrate (LOPRESSOR) 25 MG tablet Take 1 tablet (25 mg total) by mouth 2 (two) times daily. (Patient taking differently: Take 25 mg by mouth See admin instructions. Take 1 tablet (25 mg) by mouth twice daily - 11am and 11pm) 60 tablet 0  . multivitamin (RENA-VIT) TABS tablet Take 1 tablet by mouth at bedtime. 30 tablet 0  . nitroGLYCERIN (NITROSTAT) 0.4 MG SL tablet Place 1 tablet (0.4 mg total) under the tongue every 5 (five) minutes as needed for chest pain. 30 tablet 0  . ondansetron (ZOFRAN ODT) 4 MG disintegrating tablet Take 1 tablet (4 mg total) by mouth every 8 (eight) hours as needed for nausea. 30 tablet 0  . oxyCODONE (OXY IR/ROXICODONE) 5 MG immediate release tablet Take 1 tablet (5 mg total) by mouth 2 (two) times daily as needed for severe pain or breakthrough pain. (Patient taking differently: Take 5 mg by mouth every 8 (eight) hours as needed for severe pain or breakthrough pain. ) 30 tablet 0  . polyethylene glycol (MIRALAX / GLYCOLAX) packet Take 17 g by mouth 2 (two) times daily. (Patient taking differently: Take 17 g by mouth 2 (two) times daily as  needed (constipation). Mix in 8 oz liquid and drink) 60 each 0  . pregabalin (LYRICA) 75 MG capsule Take 1 capsule (75 mg total) by mouth daily. (Patient taking differently: Take 75 mg by mouth 3 (three) times daily. ) 30 capsule 0  . senna-docusate (SENOKOT-S) 8.6-50 MG tablet Take 1 tablet by mouth daily at 12 noon.     . tacrolimus (PROGRAF) 1 MG capsule Take 2 capsules (2 mg total) by mouth 2 (two) times daily. Take 2 capsules twice daily. (Patient taking differently: Take 2 mg by mouth 2 (two) times daily. ) 120 capsule 0  . calcitRIOL (ROCALTROL) 0.5 MCG capsule Take 1 capsule (0.5 mcg total) by mouth every Monday, Wednesday, and Friday with hemodialysis. 30 capsule 1  . feeding supplement (BOOST / RESOURCE BREEZE) LIQD Take 1 Container by mouth 3 (three) times daily between meals. (Patient not taking: Reported on 06/30/2016) 1 Container 100  . HYDROmorphone (DILAUDID) 2 MG tablet Take 1 tablet (2 mg total) by mouth at bedtime as needed for severe pain. (Patient not taking: Reported on 06/30/2016) 15 tablet 0  . pantoprazole (PROTONIX) 40 MG tablet Take 1 tablet (40 mg total) by mouth daily. (Patient not taking: Reported on 06/30/2016) 30 tablet 1  . traMADol (ULTRAM) 50 MG tablet Take 1 tablet (50 mg total) by mouth every 6 (six) hours as needed for moderate pain. (Patient not taking: Reported on 06/30/2016) 60 tablet 0  . warfarin (COUMADIN) 2.5 MG tablet Take 1 tablet (2.5 mg total) by mouth daily at 6 PM. (Patient taking differently: Take 1.25-2.5 mg by mouth See admin instructions. Take 1 tablet (2.5 mg) by mouth on Tuesday, Thursday and Saturday at bedtime, take 1/2 tablet (1.25 mg) on Monday, Wednesday and Friday at bedtime) 30 tablet 0    Home: Home Living Family/patient expects to be discharged to:: Private residence Living Arrangements: Children  Functional History:   Functional Status:  Mobility:          ADL:    Cognition: Cognition Orientation Level: Oriented to person,  Oriented to place, Oriented to situation  Blood pressure (!) 111/59, pulse 84, temperature (P) 98.6 F (37 C), temperature source (P) Oral, resp. rate (!) 29, height 5\' 4"  (1.626 m), weight (P) 55.9 kg (123 lb 3.8 oz), SpO2 (P) 94 %. Physical Exam  Vitals reviewed. Constitutional: She appears well-developed and well-nourished. She appears distressed.  HENT:  Head: Normocephalic and atraumatic.  Eyes: EOM are normal. Right eye exhibits no discharge. Left eye exhibits no discharge.  Neck: Normal range of motion. Neck supple. No thyromegaly present.  Cardiovascular: Normal rate and regular rhythm.   Respiratory: Effort normal and breath sounds normal. No respiratory distress.  +[place]  GI: Soft. Bowel sounds are normal. She exhibits no distension.  Musculoskeletal: She exhibits edema and tenderness.  Neurological: She is alert.  Alert.  Makes eye contact with examiner.  Multiple complaints of pain.  Follows simple commands. Motor: (limited by pain): B/l UE 4+/5 proximal to distal LLE: HF 4-/5 RLE: HF 2/5  Skin:  Right BKA is dressed and appropriately tender. Recent left BKA still with some drainage at incision site.  Psychiatric:  Assessment limited by pain    Results for orders placed or performed during the hospital encounter of 06/30/16 (from the past 24 hour(s))  Glucose, capillary     Status: Abnormal   Collection Time: 07/08/16 11:35 AM  Result Value Ref Range   Glucose-Capillary 57 (L) 65 - 99 mg/dL  Glucose, capillary     Status: Abnormal   Collection Time: 07/08/16 12:49 PM  Result Value Ref Range   Glucose-Capillary 164 (H) 65 - 99 mg/dL  Glucose, capillary     Status: Abnormal   Collection Time: 07/08/16  2:42 PM  Result Value Ref Range   Glucose-Capillary 129 (H) 65 - 99 mg/dL   Comment 1 Notify RN    Comment 2 Document in Chart   Glucose, capillary     Status: None   Collection Time: 07/08/16  5:28 PM  Result Value Ref Range   Glucose-Capillary 92 65 - 99  mg/dL  Glucose, capillary     Status: Abnormal   Collection Time: 07/08/16  5:54 PM  Result Value Ref Range   Glucose-Capillary 101 (H) 65 - 99 mg/dL  Glucose, capillary     Status: Abnormal   Collection Time: 07/08/16  9:08 PM  Result Value Ref Range   Glucose-Capillary 185 (H) 65 - 99 mg/dL  CBC     Status: Abnormal   Collection Time: 07/09/16  3:38 AM  Result Value Ref Range   WBC 16.1 (H) 4.0 - 10.5 K/uL   RBC 3.15 (L) 3.87 - 5.11 MIL/uL   Hemoglobin 9.2 (L) 12.0 - 15.0 g/dL   HCT 26.2 (L) 36.0 - 46.0 %   MCV 83.2 78.0 - 100.0 fL   MCH 29.2 26.0 - 34.0 pg   MCHC 35.1 30.0 - 36.0 g/dL   RDW 19.0 (H) 11.5 - 15.5 %   Platelets 154 150 - 400 K/uL  Heparin level (unfractionated)     Status: Abnormal   Collection Time: 07/09/16  3:38 AM  Result Value Ref Range   Heparin Unfractionated 0.20 (L) 0.30 - 0.70 IU/mL  Protime-INR     Status: Abnormal   Collection Time: 07/09/16  3:38 AM  Result Value Ref Range   Prothrombin Time 19.3 (H) 11.4 - 15.2 seconds   INR 1.60   Glucose, capillary     Status: Abnormal   Collection Time: 07/09/16  7:55 AM  Result Value Ref Range   Glucose-Capillary 140 (  H) 65 - 99 mg/dL  CBC     Status: Abnormal   Collection Time: 07/09/16  9:38 AM  Result Value Ref Range   WBC 20.6 (H) 4.0 - 10.5 K/uL   RBC 2.94 (L) 3.87 - 5.11 MIL/uL   Hemoglobin 8.4 (L) 12.0 - 15.0 g/dL   HCT 24.1 (L) 36.0 - 46.0 %   MCV 82.0 78.0 - 100.0 fL   MCH 28.6 26.0 - 34.0 pg   MCHC 34.9 30.0 - 36.0 g/dL   RDW 18.2 (H) 11.5 - 15.5 %   Platelets 135 (L) 150 - 400 K/uL  Renal function panel     Status: Abnormal   Collection Time: 07/09/16  9:38 AM  Result Value Ref Range   Sodium 130 (L) 135 - 145 mmol/L   Potassium 4.2 3.5 - 5.1 mmol/L   Chloride 97 (L) 101 - 111 mmol/L   CO2 24 22 - 32 mmol/L   Glucose, Bld 151 (H) 65 - 99 mg/dL   BUN 12 6 - 20 mg/dL   Creatinine, Ser 5.28 (H) 0.44 - 1.00 mg/dL   Calcium 9.0 8.9 - 10.3 mg/dL   Phosphorus 5.1 (H) 2.5 - 4.6 mg/dL    Albumin 1.8 (L) 3.5 - 5.0 g/dL   GFR calc non Af Amer 8 (L) >60 mL/min   GFR calc Af Amer 9 (L) >60 mL/min   Anion gap 9 5 - 15   No results found.  Assessment/Plan: Diagnosis: Right BKA with history of left BKA Labs independently reviewed.  Records reviewed and summated above. Clean amputation daily with soap and water Monitor incision site for signs of infection or impending skin breakdown. Staples to remain in place for 3-4 weeks Stump shrinker, for edema control  Scar mobilization massaging to prevent soft tissue adherence Stump protector during therapies Prevent flexion contractures by implementing the following:   Encourage prone lying for 20-30 mins per day BID to avoid hip flexion  Contractures if medically appropriate;  Avoid pillow under knees when patient is lying in bed in order to prevent both  knee and hip flexion contractures;  Avoid prolonged sitting Post surgical pain control with oral medication Phantom limb pain control with physical modalities including desensitization techniques (gentle self massage to the residual stump,hot packs if sensation intact, [place]) and mirror therapy, TENS. If ineffective, consider pharmacological treatment for neuropathic pain (e.g gabapentin, pregabalin, amytriptalyine, duloxetine).  When using wheelchair, patient should have knee on amputated side fully extended with board under the seat cushion.  1. Does the need for close, 24 hr/day medical supervision in concert with the patient's rehab needs make it unreasonable for this patient to be served in a less intensive setting? Yes 2. Co-Morbidities requiring supervision/potential complications: HTN (monitor and provide prns in accordance with increased physical exertion and pain), diabetes mellitus (Monitor in accordance with exercise and adjust meds as necessary), CAD, chronic back pain, end-stage renal disease with hemodialysis (recs per [place]), mechanical aortic valve (cont meds), history of  liver transplant, left BKA, post-op pain (Biofeedback training with therapies to help reduce reliance on opiate pain medications, particularly IV morphine and IV dilaudid, monitor pain control during therapies, and sedation at rest and titrate to maximum efficacy to ensure participation and gains in therapies) 3. Due to safety, skin/wound care, disease management, pain management and patient education, does the patient require 24 hr/day rehab nursing? Yes 4. Does the patient require coordinated care of a physician, rehab nurse, PT (1-2 hrs/day, 5 days/week) and OT (  1-2 hrs/day, 5 days/week) to address physical and functional deficits in the context of the above medical diagnosis(es)? Yes Addressing deficits in the following areas: balance, endurance, locomotion, strength, transferring, bathing, dressing, toileting and psychosocial support 5. Can the patient actively participate in an intensive therapy program of at least 3 hrs of therapy per day at least 5 days per week? Potentially 6. The potential for patient to make measurable gains while on inpatient rehab is excellent 7. Anticipated functional outcomes upon discharge from inpatient rehab are TBD  with PT, TBD with OT, n/a with SLP. 8. Estimated rehab length of stay to reach the above functional goals is: TBD. 9. Anticipated D/C setting: Home 10. Anticipated post D/C treatments: HH therapy and Home excercise program 11. Overall Rehab/Functional Prognosis: good  RECOMMENDATIONS: This patient's condition is appropriate for continued rehabilitative care in the following setting: Possibly CIR when patient able to tolerate 3 hours of therapy/day and if anticipated to make functional progress.  Will await therapy evals as well.  Patient has agreed to participate in recommended program. Potentially Note that insurance prior authorization may be required for reimbursement for recommended care.  Comment: Rehab Admissions Coordinator to follow  up.  Delice Lesch, MD, Mellody Drown Cathlyn Parsons., PA-C 07/09/2016

## 2016-07-09 NOTE — Progress Notes (Signed)
   VASCULAR SURGERY ASSESSMENT & PLAN:   1 Day Post-Op s/p: Right BKA  Pain adequately controlled.  Dressing change tomorrow.  PT/ CIR consult  On heparin.   SUBJECTIVE:   Some incisional pain  PHYSICAL EXAM:   Vitals:   07/08/16 1515 07/08/16 1534 07/08/16 2105 07/09/16 0504  BP: 100/61 (!) 105/52 (!) 133/59 (!) 120/55  Pulse:  63 73 85  Resp: 12 14 14 14   Temp: (!) 96.9 F (36.1 C) 97.6 F (36.4 C) 97.4 F (36.3 C) 98.7 F (37.1 C)  TempSrc:  Oral Oral Oral  SpO2: 100% 90% 97% 90%  Weight:   125 lb (56.7 kg)   Height:       Dressing dry.  LABS:   Lab Results  Component Value Date   WBC 16.1 (H) 07/09/2016   HGB 9.2 (L) 07/09/2016   HCT 26.2 (L) 07/09/2016   MCV 83.2 07/09/2016   PLT 154 07/09/2016   Lab Results  Component Value Date   CREATININE 4.01 (H) 07/08/2016   Lab Results  Component Value Date   INR 1.60 07/09/2016   CBG (last 3)   Recent Labs  07/08/16 1442 07/08/16 1754 07/08/16 2108  GLUCAP 129* 101* 185*    PROBLEM LIST:    Principal Problem:   Cellulitis Active Problems:   Hypothyroidism   CAD (coronary artery disease), native coronary artery   Chronic combined systolic and diastolic congestive heart failure (HCC)   COPD (chronic obstructive pulmonary disease) (HCC)   Chronic anticoagulation w/ coumadin, goal INR 2.5-3.0 w/ AVR   Chronic midline low back pain without sciatica   ESRD on dialysis ([place])   H/O heart valve replacement with mechanical valve   PAD (peripheral artery disease) (HCC)   Diabetic foot infection (HCC)   Malnutrition of moderate degree   CURRENT MEDS:   . aspirin EC  81 mg Oral Daily  . calcitRIOL  0.5 mcg Oral Q M,W,F-HD  . calcium acetate  667 mg Oral TID WC  . Chlorhexidine Gluconate Cloth  6 each Topical Daily  . darbepoetin (ARANESP) injection - DIALYSIS  60 mcg Intravenous Q Mon-HD  . docusate sodium  100 mg Oral Daily  . feeding supplement  1 Container Oral BID BM  . feeding supplement  (PRO-STAT SUGAR FREE 64)  30 mL Oral BID  . levothyroxine  175 mcg Oral QAC breakfast  . metoCLOPramide  5 mg Oral BID AC  . metoprolol tartrate  25 mg Oral BID  . multivitamin  1 tablet Oral QHS  . mupirocin ointment  1 application Nasal BID  . polyethylene glycol  17 g Oral BID  . pregabalin  75 mg Oral Daily  . senna-docusate  1 tablet Oral BID  . tacrolimus  2 mg Oral BID    Gae Gallop Beeper: [phone] Office: [phone] 07/09/2016

## 2016-07-09 NOTE — Progress Notes (Signed)
ANTICOAGULATION CONSULT NOTE - Follow-Up  Pharmacy Consult for Heparin + Warfarin Indication: mechanical AVR   Allergies  Allergen Reactions  . Acetaminophen Other (See Comments)    Liver transplant recipient   . Codeine Itching  . Mirtazapine Other (See Comments)    hallucination    Patient Measurements: Height: 5\' 4"  (162.6 cm) Weight: 123 lb 3.8 oz (55.9 kg) IBW/kg (Calculated) : 54.7 Heparin Dosing Weight: 52.6 kg  Vital Signs: Temp: 98.6 F (37 C) (05/30 0852) Temp Source: Oral (05/30 0852) BP: 110/61 (05/30 1230) Pulse Rate: 80 (05/30 1230)  Labs:  Recent Labs  07/07/16 0402 07/07/16 0708  07/08/16 0739 07/09/16 0338 07/09/16 0938  HGB 9.7*  --   < > 10.3* 9.2* 8.4*  HCT 27.3*  --   < > 29.6* 26.2* 24.1*  PLT 129*  --   < > 140* 154 135*  LABPROT 21.1*  --   --  18.3* 19.3*  --   INR 1.80  --   --  1.50 1.60  --   HEPARINUNFRC 0.37  --   --  0.27* 0.20*  --   CREATININE  --  6.43*  --  4.01*  --  5.28*  < > = values in this interval not displayed.  Estimated Creatinine Clearance: 9.7 mL/min (A) (by C-G formula based on SCr of 5.28 mg/dL (H)).   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 Heparin resumed post-op on 5/29 and now plans are to restart warfarin as well.   INR today is SUBtherapeutic at 1.6 (goal of 2.5-3.5). The patient's last warfarin dose was 5/21. The patient received Vit K 2 mg + FFP on 5/25. PTA dose was 1.25 mg daily EXCEPT for 2.5 mg on Tues/Thurs/Sun. The patient has been receiving metronidazole however this is now being discontinued.  Heparin level this afternoon has yet to be drawn. Will have this followed-up by the next shift. Post-op ABLA noted with Hgb drop to 8.4 << 9.2 << 10.3. Will continue to watch.   Goal of Therapy:  Heparin level 0.3-0.7 units/ml INR 2.5-3.5 Monitor platelets by anticoagulation protocol: Yes   Plan:  1. Continue Heparin at 1050 units/hr (10.5 ml/hr) until  the afternoon HL is drawn and accessed 2. Warfarin 2.5 mg x 1 dose at 1800 today 3. Daily PT/INR, HL, CBC q72h 4. Will continue to monitor for any signs/symptoms of bleeding and will follow up with heparin level and PT/INR   Thank you for allowing pharmacy to be a part of this patient's care.  Alycia Rossetti, PharmD, BCPS Clinical Pharmacist Pager: [phone] Clinical phone for 07/09/2016 from 7a-3:30p: [phone] If after 3:30p, please call main pharmacy at: [phone] 07/09/2016 1:19 PM

## 2016-07-09 NOTE — Evaluation (Signed)
Physical Therapy Evaluation Patient Details Name: [name] MRN: [id number] DOB: 04-19-1955 Today's Date: 07/09/2016   History of Present Illness  [name] Deliz [name] a 61 y.o.femalewith medical history significant for hypertension, diabetes, coronary artery disease, chronic back pain, depression, end-stage renal disease on HD, mechanical aortic valve on warfarin, and history of liver transplantation, H/o PAD s/p L BKA in 3/18who presents to the emergency department for evaluation of wounds on the right foot and left BKA stump; now s/p R BKA  Clinical Impression  Due to diagnosis written above, the pt presented with the following impairments listed below. Opted for a co-treat with OT due to pt experiencing an extreme amount of pain (10/10 on a numeric scale) despite receiving pain medications ~1.5 hours earlier. Despite this, pt was willing to participate and complete an anterior-posterior transfer to use the 3in1. Pt was also educated on how to desensitize B BKA's in order to decrease pain. Pt has previously rehabed at [facility] with good outcomes. Very willing to work with therapies despite pain and daughter can assist once she d/c home. Pt would benefit from CIR to maximize independence and safety in order to return home.     Follow Up Recommendations CIR    Equipment Recommendations  Other (comment) (to be determined at next location)    Recommendations for Other Services       Precautions / Restrictions Precautions Precautions: None Restrictions Weight Bearing Restrictions: Yes RLE Weight Bearing: Non weight bearing LLE Weight Bearing: Non weight bearing      Mobility  Bed Mobility Overal bed mobility: Needs Assistance Bed Mobility: Supine to Sit;Sit to Supine     Supine to sit: Mod assist Sit to supine: Min assist (HOB raised to meet pt)   General bed mobility comments: Pt required heavy mod A at trunk in order to transfer from supine to sit.   Transfers Overall transfer  level: Needs assistance   Transfers: Comptroller transfers: Max assist;Total assist   General transfer comment: Pt was able to transfer posteriorly with max A and lift herself 3x on the 3in1 using mod A. By the 3rd lift, pt required total assist due to fatigue. Pt then required max A to transfer anteriorly as well as scooting to [facility]. Noted pt was able to support herself and organize her trunk with weight shifting during transfer  Ambulation/Gait                Stairs            Wheelchair Mobility    Modified Rankin (Stroke Patients Only)       Balance Overall balance assessment: Needs assistance Sitting-balance support: Bilateral upper extremity supported;Feet unsupported Sitting balance-Leahy Scale: Fair Sitting balance - Comments: Pt had slight posterior lean and required BUE support. However, pt exhibited good awareness of COM  Postural control: Posterior lean                                   Pertinent Vitals/Pain Pain Assessment: 0-10 Pain Score: 10-Worst pain ever Pain Location: RLE Pain Descriptors / Indicators: Aching;Sore Pain Intervention(s): Monitored during session;Premedicated before session;Repositioned;Limited activity within patient's tolerance    Home Living Family/patient expects to be discharged to:: Private residence Living Arrangements: Children Available Help at Discharge: Family;Available PRN/intermittently Type of Home: Mobile home Home Access: Ramped entrance     Home Layout: One level Home  Equipment: Bedside commode;Walker - 2 wheels;Wheelchair - manual Additional Comments: A ramp has been built for her to enter home    Prior Function Level of Independence: Needs assistance   Gait / Transfers Assistance Needed: w/c for mobility. daughter assisting with pivoting to w/c and toilet  ADL's / Homemaking Assistance Needed: daughter assisting with ADL         Hand  Dominance   Dominant Hand: Right    Extremity/Trunk Assessment   Upper Extremity Assessment Upper Extremity Assessment: Defer to OT evaluation    Lower Extremity Assessment Lower Extremity Assessment: Generalized weakness;RLE deficits/detail;LLE deficits/detail RLE Deficits / Details: dec ROM and extremely sensitive to pain. Did our best to educate on necessity of extending B knees RLE: Unable to fully assess due to pain LLE: Unable to fully assess due to pain       Communication   Communication: No difficulties  Cognition Arousal/Alertness: Awake/alert Behavior During Therapy: WFL for tasks assessed/performed Overall Cognitive Status: Within Functional Limits for tasks assessed                                 General Comments: Pt was in a lot of pain from R BKA and a "sore on her bottom" RN notified      General Comments      Exercises     Assessment/Plan    PT Assessment Patient needs continued PT services  PT Problem List Decreased strength;Decreased range of motion;Decreased activity tolerance;Decreased balance;Decreased mobility;Pain;Decreased skin integrity       PT Treatment Interventions DME instruction;Functional mobility training;Therapeutic activities;Therapeutic exercise;Balance training;Neuromuscular re-education;Patient/family education;Wheelchair mobility training    PT Goals (Current goals can be found in the Care Plan section)  Acute Rehab PT Goals Patient Stated Goal: to decrease pain PT Goal Formulation: With patient Time For Goal Achievement: 07/23/16 Potential to Achieve Goals: Fair    Frequency Min 3X/week   Barriers to discharge        Co-evaluation PT/OT/SLP Co-Evaluation/Treatment: Yes Reason for Co-Treatment: Complexity of the patient's impairments (multi-system involvement);To address functional/ADL transfers PT goals addressed during session: Mobility/safety with mobility;Strengthening/ROM OT goals addressed during  session: ADL's and self-care       AM-PAC PT "6 Clicks" Daily Activity  Outcome Measure Difficulty turning over in bed (including adjusting bedclothes, sheets and blankets)?: Total Difficulty moving from lying on back to sitting on the side of the bed? : Total Difficulty sitting down on and standing up from a chair with arms (e.g., wheelchair, bedside commode, etc,.)?: Total Help needed moving to and from a bed to chair (including a wheelchair)?: Total Help needed walking in hospital room?: Total Help needed climbing 3-5 steps with a railing? : Total 6 Click Score: 2    End of Session   Activity Tolerance: Patient limited by pain Patient left: in bed;with call bell/phone within reach;with bed alarm set;with family/visitor present Nurse Communication: Mobility status;Other (comment) (to turn pt towards L side after 30 minutes) PT Visit Diagnosis: Other abnormalities of gait and mobility (R26.89);Muscle weakness (generalized) (M62.81);Pain Pain - Right/Left: Right Pain - part of body: Leg    Time: 3893-7342 PT Time Calculation (min) (ACUTE ONLY): 41 min   Charges:   PT Evaluation $PT Eval Moderate Complexity: 1 Procedure PT Treatments $Therapeutic Activity: 8-22 mins   PT G CodesMeda Coffee, SPT [phone]   Eliezer Lofts Lenis Nettleton 07/09/2016, 5:04 PM

## 2016-07-09 NOTE — Progress Notes (Signed)
[facility]                                                                              Patient Demographics  [name], is a 61 y.o. female, DOB - 1955/07/09, HGD:[id number]  Admit date - 06/30/2016   Admitting Physician Vianne Bulls, MD  Outpatient Primary MD for the patient is [name], MD  Outpatient specialists:   LOS - 9  days    Chief Complaint  Patient presents with  . Wound Check       Brief summary   [name] Eager [name] a 61 y.o.femalewith medical history significant for hypertension, diabetes, coronary artery disease, chronic back pain, depression, end-stage renal disease on HD, mechanical aortic valve on warfarin, and history of liver transplantation, H/o PAD s/p L BKA in 3/18 who presented to the emergency department for evaluation of wounds on the right foot and left BKA stump. She is s/p right BKA 5/29 by Dr. Scot Dock.   Assessment & Plan   Right foot nonhealing ulcers with gangrenous changes with underlying PVD: Underlying risk factors including diabetes, ongoing tobacco abuse, ESRD. Patient underwent arteriogram on 5/25 by vascular surgery, no suitable endovascular or operative interventions that would improve flow to the right foot. Left could not be obtained due to BKA - S/p left BKA 3 months ago, s/p right BKA 5/29. - Continue flagyl, ceftriaxone for 24 hours after procedure - Augment dilaudid dose to 4mg  prn. DC morphine given ESRD.  Pressure ulcers: Right heel and great toe resolved s/p BKA. Left stump site: 1.0cm x 3.5cm x 0.3; minimal drainage, no odor - Wound care following, Vascular surgery following  Mechanical AVR - Continue IV heparin bridge back to coumadin.   ESRD on hemodialysis MWF - Nephrology following, on schedule.  Coronary artery disease - Currently stable, no chest pain, continue aspirin, beta blocker  Diabetes mellitus with hypoglycemia - Not on any meds, poor oral intake - Not on any sliding scale    History of liver transplantation - Continue prograf  Hypertension - Currently stable, continue Lopressor  Constipation: Resolved  Code Status: Partial DO NOT INTUBATE DVT Prophylaxis: Heparin + coumadin restarting today. Family Communication: None at bedside Disposition Plan: Anticipate DC to CIR if acceptable or SNF in next 24 hours.   Time Spent: 25 minutes  Procedures:  ABI Right - ABI could not be ascertained due to non compressible   vessel in the dorsalis pedis artery. The ABI calculated from the   posterior tibial artery indicate s a severe reduction in arterial   flow at res. - Left - ABI cannot be obtained due to below the knee amputation.  07/04/16 Procedure: Abdominal aortogram with right lower extremity runoff  Consultants:   Nephrology Vascular surgery  Antimicrobials:   IV ceftriaxone 5/21 > 6/1  IV flagyl 5/21 > 5/30  Medications  Scheduled Meds: . aspirin EC  81 mg Oral Daily  . calcitRIOL  0.5 mcg Oral Q M,W,F-HD  . calcium acetate  667 mg Oral TID WC  . Chlorhexidine Gluconate Cloth  6 each Topical Daily  . darbepoetin (ARANESP) injection - DIALYSIS  60  mcg Intravenous Q Mon-HD  . docusate sodium  100 mg Oral Daily  . feeding supplement  1 Container Oral BID BM  . feeding supplement (PRO-STAT SUGAR FREE 64)  30 mL Oral BID  . levothyroxine  175 mcg Oral QAC breakfast  . metoCLOPramide  5 mg Oral BID AC  . metoprolol tartrate  25 mg Oral BID  . multivitamin  1 tablet Oral QHS  . mupirocin ointment  1 application Nasal BID  . polyethylene glycol  17 g Oral BID  . pregabalin  75 mg Oral Daily  . senna-docusate  1 tablet Oral BID  . tacrolimus  2 mg Oral BID   Continuous Infusions: . sodium chloride Stopped (07/08/16 1359)  . cefTRIAXone (ROCEPHIN)  IV Stopped (07/08/16 0850)  . ferric gluconate (FERRLECIT/NULECIT) IV Stopped (07/05/16 1214)  . heparin 1,050 Units/hr (07/09/16 1037)   PRN Meds:.acetaminophen **OR** acetaminophen, gi  cocktail, hydrALAZINE, HYDROmorphone, labetalol, methocarbamol, metoprolol tartrate, morphine injection, ondansetron **OR** ondansetron (ZOFRAN) IV, oxyCODONE  Antibiotics   Anti-infectives    Start     Dose/Rate Route Frequency Ordered Stop   07/08/16 1315  ceFAZolin (ANCEF) IVPB 2g/100 mL premix  Status:  Discontinued     2 g 200 mL/hr over 30 Minutes Intravenous  Once 07/08/16 1308 07/08/16 1530   07/08/16 1254  ceFAZolin (ANCEF) 2-4 GM/100ML-% IVPB    Comments:  Forte, Lindsi   : cabinet override      07/08/16 1254 07/09/16 0059   07/08/16 0000  ceFAZolin (ANCEF) IVPB 1 g/50 mL premix  Status:  Discontinued    Comments:  Send with pt to OR   1 g 100 mL/hr over 30 Minutes Intravenous On call 07/07/16 0859 07/08/16 1307   07/01/16 0800  cefTRIAXone (ROCEPHIN) 2 g in dextrose 5 % 50 mL IVPB     2 g 100 mL/hr over 30 Minutes Intravenous Every 24 hours 06/30/16 2027     07/01/16 0800  metroNIDAZOLE (FLAGYL) IVPB 500 mg  Status:  Discontinued     500 mg 100 mL/hr over 60 Minutes Intravenous Every 8 hours 06/30/16 2027 07/09/16 1341   06/30/16 2000  piperacillin-tazobactam (ZOSYN) IVPB 3.375 g  Status:  Discontinued     3.375 g 12.5 mL/hr over 240 Minutes Intravenous Every 12 hours 06/30/16 1925 06/30/16 2027     Subjective:   Right leg pain not controlled with current medications, dose not adequate. Has not worked with PT yet, but wants to go to CIR. No fevers, chills, shortness of breath, chest pain.   Objective:   Vitals:   07/09/16 1100 07/09/16 1130 07/09/16 1230 07/09/16 1248  BP: (!) 92/56 100/60 110/61 (P) 125/81  Pulse: 83 83 80 (P) 77  Resp:    (P) 16  Temp:    (P) 98.4 F (36.9 C)  TempSrc:    (P) Oral  SpO2:      Weight:      Height:        Intake/Output Summary (Last 24 hours) at 07/09/16 1348 Last data filed at 07/09/16 1248  Gross per 24 hour  Intake          1161.99 ml  Output             2550 ml  Net         -1388.01 ml   Wt Readings from Last 3  Encounters:  07/09/16 55.9 kg (123 lb 3.8 oz)  05/28/16 49.1 kg (108 lb 3.9 oz)  05/20/16 54.4 kg (120  lb)   Exam  General: Alert and oriented 3, NAD   HEENT:  MMM, poor dentition  Neck: Supple, no JVD  Cardiovascular: S1 and S2 clear, mechanical valve click.    Respiratory: Nonlabored, CTAB  Gastrointestinal: Soft, NT, ND, NBS   Ext: Left BKA stump normal with 1.0cm x 3.5cm x 0.3. Minimal drainage, no odor. Right BKA dressing c/d/i. Reports pain in proximal leg without overlying erythema, swelling, deformity.   Neuro: No focal deficits.  Skin: As above  Psych:  alert and oriented 3, normal affect   Data Reviewed:  I have personally reviewed following labs and imaging studies  Micro Results Recent Results (from the past 240 hour(s))  MRSA PCR Screening     Status: Abnormal   Collection Time: 06/30/16 10:15 PM  Result Value Ref Range Status   MRSA by PCR INVALID RESULTS, SPECIMEN SENT FOR CULTURE (A) NEGATIVE Final    Comment: Results Called toChapman Moss RN, AT [phone] 07/01/16 BY D.VANHOOK   MRSA culture     Status: None   Collection Time: 06/30/16 10:15 PM  Result Value Ref Range Status   Specimen Description NASAL SWAB  Final   Special Requests NONE  Final   Culture NO MRSA DETECTED  Final   Report Status 07/03/2016 FINAL  Final  Blood Cultures x 2 sites     Status: None   Collection Time: 06/30/16 10:39 PM  Result Value Ref Range Status   Specimen Description BLOOD LEFT HAND  Final   Special Requests   Final    BOTTLES DRAWN AEROBIC AND ANAEROBIC Blood Culture adequate volume   Culture NO GROWTH 5 DAYS  Final   Report Status 07/06/2016 FINAL  Final  Blood Cultures x 2 sites     Status: None   Collection Time: 06/30/16 10:47 PM  Result Value Ref Range Status   Specimen Description BLOOD RIGHT HAND  Final   Special Requests   Final    BOTTLES DRAWN AEROBIC AND ANAEROBIC Blood Culture adequate volume   Culture NO GROWTH 5 DAYS  Final   Report Status 07/06/2016  FINAL  Final  Surgical pcr screen     Status: Abnormal   Collection Time: 07/07/16  8:15 PM  Result Value Ref Range Status   MRSA, PCR NEGATIVE NEGATIVE Final   Staphylococcus aureus POSITIVE (A) NEGATIVE Final    Comment:        The Xpert SA Assay (FDA approved for NASAL specimens in patients over 35 years of age), is one component of a comprehensive surveillance program.  Test performance has been validated by [facility] for patients greater than or equal to 79 year old. It is not intended to diagnose infection nor to guide or monitor treatment.     Radiology Reports Dg Tibia/fibula Left  Result Date: 06/30/2016 CLINICAL DATA:  Amputation wound, check for bony destruction EXAM: LEFT TIBIA AND FIBULA - 2 VIEW COMPARISON:  None. FINDINGS: Changes consistent with a below the knee amputation are seen. Mild osteopenia is noted. No bony erosion to suggest osteomyelitis is seen. IMPRESSION: No evidence of osteomyelitis. Electronically Signed   By: Inez Catalina M.D.   On: 06/30/2016 18:48   Dg Foot Complete Right  Result Date: 06/30/2016 CLINICAL DATA:  Open wound that will not heal since April. Wounds of the first digit and heel. EXAM: RIGHT FOOT COMPLETE - 3+ VIEW COMPARISON:  None. FINDINGS: The patient's heel wound is subtly visible. No soft tissue emphysema or opaque foreign body.  No erosion to suggest osteomyelitis. Osteopenia and diffuse arterially calcification. IMPRESSION: No evidence of osteomyelitis or opaque foreign body. Electronically Signed   By: Monte Fantasia M.D.   On: 06/30/2016 13:41   Lab Data:  CBC:  Recent Labs Lab 07/07/16 0402 07/07/16 0709 07/08/16 0739 07/09/16 0338 07/09/16 0938  WBC 12.0* 12.6* 16.5* 16.1* 20.6*  HGB 9.7* 8.9* 10.3* 9.2* 8.4*  HCT 27.3* 25.2* 29.6* 26.2* 24.1*  MCV 83.0 82.6 82.2 83.2 82.0  PLT 129* 130* 140* 154 643*   Basic Metabolic Panel:  Recent Labs Lab 07/02/16 1430 07/04/16 0425 07/07/16 0708 07/08/16 0739  07/09/16 0938  NA 134* 134* 133* 130* 130*  K 3.1* 3.3* 3.5 4.4 4.2  CL 97* 96* 97* 98* 97*  CO2 28 25 24 23 24   GLUCOSE 128* 156* 127* 104* 151*  BUN <5* 16 15 7 12   CREATININE 2.50* 5.26* 6.43* 4.01* 5.28*  CALCIUM 7.4* 8.8* 9.0 9.1 9.0  PHOS 2.8  --  5.8*  --  5.1*   GFR: Estimated Creatinine Clearance: 9.7 mL/min (A) (by C-G formula based on SCr of 5.28 mg/dL (H)). Liver Function Tests:  Recent Labs Lab 07/02/16 1430 07/07/16 0708 07/09/16 0938  ALBUMIN 1.8* 1.7* 1.8*   Coagulation Profile:  Recent Labs Lab 07/05/16 0433 07/06/16 0624 07/07/16 0402 07/08/16 0739 07/09/16 0338  INR 1.44 1.62 1.80 1.50 1.60   CBG:  Recent Labs Lab 07/08/16 1442 07/08/16 1728 07/08/16 1754 07/08/16 2108 07/09/16 0755  GLUCAP 129* 92 101* 185* 140*    Vance Gather M.D. [facility] 07/09/2016, 1:48 PM  Pager: [phone]  After 7pm go to [email or web address] - password [email or web address] Call night coverage person covering after 7pm

## 2016-07-09 NOTE — Progress Notes (Signed)
PT Cancellation Note  Patient Details Name: [name] MRN: [id number] DOB: 05/23/1955   Cancelled Treatment:    Reason Eval/Treat Not Completed: Patient at procedure or test/unavailable   Currently in HD;  Will follow up later today as time allows;  Otherwise, will follow up for PT tomorrow;   Thank you,  Roney Marion, PT  Acute Rehabilitation Services Pager [phone] Office [place] 07/09/2016, 8:52 AM

## 2016-07-09 NOTE — Progress Notes (Addendum)
Nutrition Follow-up  DOCUMENTATION CODES:   Non-severe (moderate) malnutrition in context of chronic illness  INTERVENTION:   -Increase Boost Breeze to TID with meals -Diet had previously been liberalized to Regular by MD due to poor po intake. Recommend changing diet back to Regular. If intra-dialytic weight gains are high, may need to reinstate fluid restriction -Discontinue Pro-stat as pt not taking -Adding small high protein snacks between meals as well   NUTRITION DIAGNOSIS:   Malnutrition (mild) related to chronic illness (PAD) as evidenced by energy intake < 75% for > or equal to 1 month, mild depletion of body fat, moderate depletion of body fat, mild depletion of muscle mass, moderate depletions of muscle mass.  Continues but being addressed via liberalized diet, snacks, supplements  GOAL:   Patient will meet greater than or equal to 90% of their needs  Progressing  MONITOR:   PO intake, Supplement acceptance, Weight trends, Labs, Skin, I & O's  REASON FOR ASSESSMENT:   Consult Wound healing  ASSESSMENT:   [name] is a 61 y.o. female with medical history significant for hypertension, diabetes, coronary artery disease, chronic back pain, depression, end-stage renal disease on HD, mechanical aortic valve on warfarin, and history of liver transplantation, H/o PAD s/p L BKA in 3/18 who presents to the emergency department for evaluation of wounds on the right foot and left BKA stump  Pt seen during dialysis today. Pt crying in pain on visit. Pt reports appetite remains poor. Pt inquiring why her diet was changed back to Renal/Carb, previously liberalized to Regular due to poor po. Pt requesting diet to be changed back to Regular. Pt reports she is drinking Boost Breeze  but not eating well at meal times. Pt not taking Pro-stat. Per [facility] pt has been refusing.   Labs: sodium 130, phosphorus 5.1 (acceptable for HD) Meds: phoslo, calcitriol, reglan, Rena-Vit,  Prograf  Diet Order:  Renal/Carb Modified Diet  Skin:  Wound (see comment) (nonhealing wound lt BKA stump, st II coccyx, UN rt heel, rt )  Last BM:  07/07/16  Height:   Ht Readings from Last 1 Encounters:  06/30/16 5\' 4"  (1.626 m)    Weight:   Wt Readings from Last 1 Encounters:  07/09/16 123 lb 3.8 oz (55.9 kg)   Filed Weights   07/07/16 2052 07/08/16 2105 07/09/16 0852  Weight: 124 lb 1.9 oz (56.3 kg) 125 lb (56.7 kg) 123 lb 3.8 oz (55.9 kg)    Ideal Body Weight:  48 kg (adusted for B/L BKA)  BMI:  Body mass index is 21.15 kg/m.  Estimated Nutritional Needs:   Kcal:  1700-1900 kcals  Protein:  80-95 grams  Fluid:  per MD  EDUCATION NEEDS:   Education needs addressed  Kerman Passey MS, RD, LDN [phone] Pager  [phone] Weekend/On-Call Pager

## 2016-07-09 NOTE — Consult Note (Signed)
   [facility] Inpatient Consult   07/09/2016  [name] 10-11-1955 [id number]   Patient was assessed for multiple admissions with the Medicare [facility] ACO. Patient underwent a Right Below the knee amputation on 07/08/16.  Patient is a Left BKA.  Chart review reveals that the patient is being considered to return to inpatient rehabilitation. Came by to speak with the patient but she was in session with therapies.  Will continue to follow.  For questions, please contact:  Natividad Brood, RN BSN [facility] Liaison  [phone] business mobile phone Toll free office [phone]

## 2016-07-09 NOTE — Progress Notes (Signed)
I await therapy evaluations to assist in planning rehab venue options. Pt previously at [facility] 04/2016. I will follow up tomorrow. [phone]

## 2016-07-10 ENCOUNTER — Encounter (HOSPITAL_COMMUNITY): Payer: Self-pay | Admitting: *Deleted

## 2016-07-10 ENCOUNTER — Inpatient Hospital Stay (HOSPITAL_COMMUNITY)
Admission: RE | Admit: 2016-07-10 | Discharge: 2016-07-24 | DRG: 981 | Disposition: A | Discharge status: Home Health | Payer: Medicare Other | Source: Intra-hospital | Attending: Physical Medicine & Rehabilitation | Admitting: Physical Medicine & Rehabilitation

## 2016-07-10 DIAGNOSIS — I2510 Atherosclerotic heart disease of native coronary artery without angina pectoris: Secondary | ICD-10-CM | POA: Diagnosis present

## 2016-07-10 DIAGNOSIS — E43 Unspecified severe protein-calorie malnutrition: Secondary | ICD-10-CM | POA: Diagnosis present

## 2016-07-10 DIAGNOSIS — D638 Anemia in other chronic diseases classified elsewhere: Secondary | ICD-10-CM | POA: Diagnosis not present

## 2016-07-10 DIAGNOSIS — Z886 Allergy status to analgesic agent status: Secondary | ICD-10-CM

## 2016-07-10 DIAGNOSIS — Z992 Dependence on renal dialysis: Secondary | ICD-10-CM

## 2016-07-10 DIAGNOSIS — Z7901 Long term (current) use of anticoagulants: Secondary | ICD-10-CM

## 2016-07-10 DIAGNOSIS — Z89512 Acquired absence of left leg below knee: Secondary | ICD-10-CM

## 2016-07-10 DIAGNOSIS — T8640 Unspecified complication of liver transplant: Secondary | ICD-10-CM | POA: Diagnosis not present

## 2016-07-10 DIAGNOSIS — E1152 Type 2 diabetes mellitus with diabetic peripheral angiopathy with gangrene: Secondary | ICD-10-CM | POA: Diagnosis present

## 2016-07-10 DIAGNOSIS — E1129 Type 2 diabetes mellitus with other diabetic kidney complication: Secondary | ICD-10-CM | POA: Diagnosis not present

## 2016-07-10 DIAGNOSIS — F17210 Nicotine dependence, cigarettes, uncomplicated: Secondary | ICD-10-CM | POA: Diagnosis present

## 2016-07-10 DIAGNOSIS — R0602 Shortness of breath: Secondary | ICD-10-CM

## 2016-07-10 DIAGNOSIS — Z89511 Acquired absence of right leg below knee: Secondary | ICD-10-CM

## 2016-07-10 DIAGNOSIS — E118 Type 2 diabetes mellitus with unspecified complications: Secondary | ICD-10-CM

## 2016-07-10 DIAGNOSIS — Z681 Body mass index (BMI) 19 or less, adult: Secondary | ICD-10-CM

## 2016-07-10 DIAGNOSIS — Z952 Presence of prosthetic heart valve: Secondary | ICD-10-CM | POA: Diagnosis not present

## 2016-07-10 DIAGNOSIS — Z993 Dependence on wheelchair: Secondary | ICD-10-CM | POA: Diagnosis not present

## 2016-07-10 DIAGNOSIS — Z79899 Other long term (current) drug therapy: Secondary | ICD-10-CM

## 2016-07-10 DIAGNOSIS — N185 Chronic kidney disease, stage 5: Secondary | ICD-10-CM | POA: Diagnosis not present

## 2016-07-10 DIAGNOSIS — N186 End stage renal disease: Secondary | ICD-10-CM | POA: Diagnosis present

## 2016-07-10 DIAGNOSIS — Z4781 Encounter for orthopedic aftercare following surgical amputation: Principal | ICD-10-CM

## 2016-07-10 DIAGNOSIS — Z833 Family history of diabetes mellitus: Secondary | ICD-10-CM

## 2016-07-10 DIAGNOSIS — D72829 Elevated white blood cell count, unspecified: Secondary | ICD-10-CM | POA: Diagnosis not present

## 2016-07-10 DIAGNOSIS — I509 Heart failure, unspecified: Secondary | ICD-10-CM | POA: Diagnosis present

## 2016-07-10 DIAGNOSIS — I5023 Acute on chronic systolic (congestive) heart failure: Secondary | ICD-10-CM | POA: Diagnosis not present

## 2016-07-10 DIAGNOSIS — I120 Hypertensive chronic kidney disease with stage 5 chronic kidney disease or end stage renal disease: Secondary | ICD-10-CM | POA: Diagnosis not present

## 2016-07-10 DIAGNOSIS — E039 Hypothyroidism, unspecified: Secondary | ICD-10-CM | POA: Diagnosis present

## 2016-07-10 DIAGNOSIS — J449 Chronic obstructive pulmonary disease, unspecified: Secondary | ICD-10-CM | POA: Diagnosis present

## 2016-07-10 DIAGNOSIS — Z944 Liver transplant status: Secondary | ICD-10-CM

## 2016-07-10 DIAGNOSIS — M898X9 Other specified disorders of bone, unspecified site: Secondary | ICD-10-CM | POA: Diagnosis present

## 2016-07-10 DIAGNOSIS — K5900 Constipation, unspecified: Secondary | ICD-10-CM | POA: Diagnosis present

## 2016-07-10 DIAGNOSIS — K219 Gastro-esophageal reflux disease without esophagitis: Secondary | ICD-10-CM | POA: Diagnosis present

## 2016-07-10 DIAGNOSIS — I132 Hypertensive heart and chronic kidney disease with heart failure and with stage 5 chronic kidney disease, or end stage renal disease: Secondary | ICD-10-CM | POA: Diagnosis present

## 2016-07-10 DIAGNOSIS — Z888 Allergy status to other drugs, medicaments and biological substances status: Secondary | ICD-10-CM

## 2016-07-10 DIAGNOSIS — E440 Moderate protein-calorie malnutrition: Secondary | ICD-10-CM

## 2016-07-10 DIAGNOSIS — E1142 Type 2 diabetes mellitus with diabetic polyneuropathy: Secondary | ICD-10-CM | POA: Diagnosis not present

## 2016-07-10 DIAGNOSIS — G546 Phantom limb syndrome with pain: Secondary | ICD-10-CM | POA: Diagnosis present

## 2016-07-10 DIAGNOSIS — E1122 Type 2 diabetes mellitus with diabetic chronic kidney disease: Secondary | ICD-10-CM | POA: Diagnosis present

## 2016-07-10 DIAGNOSIS — Z7982 Long term (current) use of aspirin: Secondary | ICD-10-CM | POA: Diagnosis not present

## 2016-07-10 DIAGNOSIS — N2581 Secondary hyperparathyroidism of renal origin: Secondary | ICD-10-CM | POA: Diagnosis present

## 2016-07-10 DIAGNOSIS — F329 Major depressive disorder, single episode, unspecified: Secondary | ICD-10-CM | POA: Diagnosis present

## 2016-07-10 DIAGNOSIS — D631 Anemia in chronic kidney disease: Secondary | ICD-10-CM | POA: Diagnosis not present

## 2016-07-10 DIAGNOSIS — I10 Essential (primary) hypertension: Secondary | ICD-10-CM | POA: Diagnosis not present

## 2016-07-10 DIAGNOSIS — D62 Acute posthemorrhagic anemia: Secondary | ICD-10-CM | POA: Diagnosis not present

## 2016-07-10 DIAGNOSIS — Z794 Long term (current) use of insulin: Secondary | ICD-10-CM

## 2016-07-10 DIAGNOSIS — K319 Disease of stomach and duodenum, unspecified: Secondary | ICD-10-CM | POA: Diagnosis present

## 2016-07-10 DIAGNOSIS — B1920 Unspecified viral hepatitis C without hepatic coma: Secondary | ICD-10-CM | POA: Diagnosis present

## 2016-07-10 DIAGNOSIS — S88111A Complete traumatic amputation at level between knee and ankle, right lower leg, initial encounter: Secondary | ICD-10-CM

## 2016-07-10 DIAGNOSIS — E11649 Type 2 diabetes mellitus with hypoglycemia without coma: Secondary | ICD-10-CM | POA: Diagnosis present

## 2016-07-10 DIAGNOSIS — D72828 Other elevated white blood cell count: Secondary | ICD-10-CM | POA: Diagnosis not present

## 2016-07-10 DIAGNOSIS — D649 Anemia, unspecified: Secondary | ICD-10-CM | POA: Diagnosis present

## 2016-07-10 LAB — CBC
HEMATOCRIT: 26.9 % — AB (ref 36.0–46.0)
Hemoglobin: 9.4 g/dL — ABNORMAL LOW (ref 12.0–15.0)
MCH: 29 pg (ref 26.0–34.0)
MCHC: 34.9 g/dL (ref 30.0–36.0)
MCV: 83 fL (ref 78.0–100.0)
PLATELETS: 137 10*3/uL — AB (ref 150–400)
RBC: 3.24 MIL/uL — ABNORMAL LOW (ref 3.87–5.11)
RDW: 19.1 % — ABNORMAL HIGH (ref 11.5–15.5)
WBC: 23.5 10*3/uL — ABNORMAL HIGH (ref 4.0–10.5)

## 2016-07-10 LAB — RENAL FUNCTION PANEL
ALBUMIN: 1.8 g/dL — AB (ref 3.5–5.0)
Anion gap: 11 (ref 5–15)
BUN: 6 mg/dL (ref 6–20)
CALCIUM: 8.9 mg/dL (ref 8.9–10.3)
CO2: 23 mmol/L (ref 22–32)
Chloride: 96 mmol/L — ABNORMAL LOW (ref 101–111)
Creatinine, Ser: 3.39 mg/dL — ABNORMAL HIGH (ref 0.44–1.00)
GFR calc Af Amer: 16 mL/min — ABNORMAL LOW (ref >60)
GFR, EST NON AFRICAN AMERICAN: 14 mL/min — AB (ref >60)
Glucose, Bld: 166 mg/dL — ABNORMAL HIGH (ref 65–99)
PHOSPHORUS: 4.3 mg/dL (ref 2.5–4.6)
POTASSIUM: 3.9 mmol/L (ref 3.5–5.1)
SODIUM: 130 mmol/L — AB (ref 135–145)

## 2016-07-10 LAB — GLUCOSE, CAPILLARY
GLUCOSE-CAPILLARY: 257 mg/dL — AB (ref 65–99)
GLUCOSE-CAPILLARY: 318 mg/dL — AB (ref 65–99)
Glucose-Capillary: 174 mg/dL — ABNORMAL HIGH (ref 65–99)
Glucose-Capillary: 140 mg/dL — ABNORMAL HIGH (ref 65–99)
Glucose-Capillary: 194 mg/dL — ABNORMAL HIGH (ref 65–99)

## 2016-07-10 LAB — HEPARIN LEVEL (UNFRACTIONATED)
HEPARIN UNFRACTIONATED: 0.28 [IU]/mL — AB (ref 0.30–0.70)
Heparin Unfractionated: 0.4 IU/mL (ref 0.30–0.70)

## 2016-07-10 LAB — PROTIME-INR
INR: 1.78
PROTHROMBIN TIME: 20.9 s — AB (ref 11.4–15.2)

## 2016-07-10 MED ORDER — SORBITOL 70 % SOLN
30.0000 mL | Freq: Every day | Status: DC | PRN
  Administered 2016-07-23: 30 mL via ORAL
  Filled 2016-07-10 (×2): qty 30

## 2016-07-10 MED ORDER — POLYETHYLENE GLYCOL 3350 17 G PO PACK
17.0000 g | PACK | Freq: Two times a day (BID) | ORAL | Status: DC
  Administered 2016-07-10 – 2016-07-24 (×15): 17 g via ORAL
  Filled 2016-07-10 (×23): qty 1

## 2016-07-10 MED ORDER — CALCITRIOL 0.5 MCG PO CAPS
0.5000 ug | ORAL_CAPSULE | ORAL | Status: DC
  Administered 2016-07-11 – 2016-07-23 (×5): 0.5 ug via ORAL
  Filled 2016-07-10 (×6): qty 1

## 2016-07-10 MED ORDER — WARFARIN - PHARMACIST DOSING INPATIENT: Freq: Every day | Status: DC

## 2016-07-10 MED ORDER — ACETAMINOPHEN 650 MG RE SUPP: 650.0000 mg | Freq: Four times a day (QID) | RECTAL | Status: DC | PRN

## 2016-07-10 MED ORDER — DARBEPOETIN ALFA 60 MCG/0.3ML IJ SOSY: 60.0000 ug | PREFILLED_SYRINGE | INTRAMUSCULAR | Status: DC

## 2016-07-10 MED ORDER — ASPIRIN EC 81 MG PO TBEC
81.0000 mg | DELAYED_RELEASE_TABLET | Freq: Every day | ORAL | Status: DC
  Administered 2016-07-11 – 2016-07-24 (×12): 81 mg via ORAL
  Filled 2016-07-10 (×12): qty 1

## 2016-07-10 MED ORDER — SODIUM CHLORIDE 0.9 % IV SOLN
62.5000 mg | INTRAVENOUS | Status: DC
  Filled 2016-07-10 (×2): qty 5

## 2016-07-10 MED ORDER — OXYCODONE HCL 5 MG PO TABS
5.0000 mg | ORAL_TABLET | ORAL | Status: DC | PRN
  Administered 2016-07-10 – 2016-07-18 (×25): 5 mg via ORAL
  Filled 2016-07-10 (×24): qty 1

## 2016-07-10 MED ORDER — WARFARIN SODIUM 3 MG PO TABS
3.0000 mg | ORAL_TABLET | Freq: Once | ORAL | Status: AC
  Administered 2016-07-10: 3 mg via ORAL
  Filled 2016-07-10 (×2): qty 1

## 2016-07-10 MED ORDER — SENNOSIDES-DOCUSATE SODIUM 8.6-50 MG PO TABS
1.0000 | ORAL_TABLET | Freq: Two times a day (BID) | ORAL | Status: DC
  Administered 2016-07-10 – 2016-07-24 (×22): 1 via ORAL
  Filled 2016-07-10 (×24): qty 1

## 2016-07-10 MED ORDER — LEVOTHYROXINE SODIUM 75 MCG PO TABS
175.0000 ug | ORAL_TABLET | Freq: Every day | ORAL | Status: DC
  Administered 2016-07-11 – 2016-07-24 (×14): 175 ug via ORAL
  Filled 2016-07-10 (×14): qty 1

## 2016-07-10 MED ORDER — MUPIROCIN 2 % EX OINT
1.0000 "application " | TOPICAL_OINTMENT | Freq: Two times a day (BID) | CUTANEOUS | Status: AC
  Administered 2016-07-10 – 2016-07-12 (×5): 1 via NASAL
  Filled 2016-07-10: qty 22

## 2016-07-10 MED ORDER — DOCUSATE SODIUM 100 MG PO CAPS
100.0000 mg | ORAL_CAPSULE | Freq: Every day | ORAL | Status: DC
  Administered 2016-07-11 – 2016-07-24 (×12): 100 mg via ORAL
  Filled 2016-07-10 (×12): qty 1

## 2016-07-10 MED ORDER — ONDANSETRON HCL 4 MG PO TABS
4.0000 mg | ORAL_TABLET | Freq: Four times a day (QID) | ORAL | Status: DC | PRN
  Administered 2016-07-11 – 2016-07-23 (×3): 4 mg via ORAL
  Filled 2016-07-10 (×3): qty 1

## 2016-07-10 MED ORDER — ONDANSETRON HCL 4 MG/2ML IJ SOLN: 4.0000 mg | Freq: Four times a day (QID) | INTRAMUSCULAR | Status: DC | PRN

## 2016-07-10 MED ORDER — METHOCARBAMOL 500 MG PO TABS
250.0000 mg | ORAL_TABLET | Freq: Four times a day (QID) | ORAL | Status: DC | PRN
  Administered 2016-07-10 – 2016-07-18 (×10): 250 mg via ORAL
  Filled 2016-07-10 (×12): qty 1

## 2016-07-10 MED ORDER — HEPARIN (PORCINE) IN NACL 100-0.45 UNIT/ML-% IJ SOLN
1150.0000 [IU]/h | INTRAMUSCULAR | Status: DC
  Administered 2016-07-10: 1050 [IU]/h via INTRAVENOUS
  Administered 2016-07-12: 1150 [IU]/h via INTRAVENOUS
  Filled 2016-07-10 (×2): qty 250

## 2016-07-10 MED ORDER — PREGABALIN 75 MG PO CAPS
75.0000 mg | ORAL_CAPSULE | Freq: Every day | ORAL | Status: DC
  Administered 2016-07-11 – 2016-07-15 (×5): 75 mg via ORAL
  Filled 2016-07-10 (×5): qty 1

## 2016-07-10 MED ORDER — HYDROMORPHONE HCL 2 MG PO TABS: 2.0000 mg | ORAL_TABLET | Freq: Four times a day (QID) | ORAL | 0 refills | Status: DC | PRN

## 2016-07-10 MED ORDER — METOPROLOL TARTRATE 25 MG PO TABS
25.0000 mg | ORAL_TABLET | Freq: Two times a day (BID) | ORAL | Status: DC
  Administered 2016-07-10 – 2016-07-11 (×3): 25 mg via ORAL
  Filled 2016-07-10 (×3): qty 1

## 2016-07-10 MED ORDER — TACROLIMUS 1 MG PO CAPS
2.0000 mg | ORAL_CAPSULE | Freq: Two times a day (BID) | ORAL | Status: DC
  Administered 2016-07-10 – 2016-07-24 (×27): 2 mg via ORAL
  Filled 2016-07-10 (×29): qty 2

## 2016-07-10 MED ORDER — BOOST / RESOURCE BREEZE PO LIQD
1.0000 | Freq: Three times a day (TID) | ORAL | Status: DC
  Administered 2016-07-10 – 2016-07-24 (×33): 1 via ORAL

## 2016-07-10 MED ORDER — RENA-VITE PO TABS
1.0000 | ORAL_TABLET | Freq: Every day | ORAL | Status: DC
  Administered 2016-07-10 – 2016-07-23 (×14): 1 via ORAL
  Filled 2016-07-10 (×14): qty 1

## 2016-07-10 MED ORDER — ACETAMINOPHEN 325 MG PO TABS: 650.0000 mg | ORAL_TABLET | Freq: Four times a day (QID) | ORAL | Status: DC | PRN

## 2016-07-10 MED ORDER — GI COCKTAIL ~~LOC~~
30.0000 mL | Freq: Two times a day (BID) | ORAL | Status: DC | PRN
  Administered 2016-07-13 – 2016-07-23 (×3): 30 mL via ORAL
  Filled 2016-07-10 (×5): qty 30

## 2016-07-10 MED ORDER — WARFARIN SODIUM 3 MG PO TABS
3.0000 mg | ORAL_TABLET | Freq: Once | ORAL | Status: DC
  Filled 2016-07-10: qty 1

## 2016-07-10 MED ORDER — METOCLOPRAMIDE HCL 5 MG PO TABS
5.0000 mg | ORAL_TABLET | Freq: Two times a day (BID) | ORAL | Status: DC
  Administered 2016-07-10 – 2016-07-24 (×26): 5 mg via ORAL
  Filled 2016-07-10 (×26): qty 1

## 2016-07-10 MED ORDER — CALCIUM ACETATE (PHOS BINDER) 667 MG PO CAPS
667.0000 mg | ORAL_CAPSULE | Freq: Three times a day (TID) | ORAL | Status: DC
  Administered 2016-07-10 – 2016-07-24 (×37): 667 mg via ORAL
  Filled 2016-07-10 (×37): qty 1

## 2016-07-10 NOTE — Progress Notes (Signed)
[name] Gong, RN Rehab Admission Coordinator Signed Physical Medicine and Rehabilitation  Progress Notes Date of Service: 07/10/2016 3:06 PM  Related encounter: ED to Hosp-Admission (Current) from 06/30/2016 in [facility]       [] Hide copied text           [name] Gong, RN Rehab Admission Coordinator Signed Physical Medicine and Rehabilitation  PMR Pre-admission Date of Service: 07/10/2016 10:53 AM        [] Hide copied text PMR Admission Coordinator Pre-Admission Assessment  Patient:[name] F [name] an 61 y.o., female CHY:[id number] DOB:27-Jul-1955 Height:5\' 4"  (162.6 cm) Weight:53.5 kg (117 lb 15.1 oz)  Insurance Information HMO: PPO: PCP: IPA:80/20: yesOTHER: no HMO PRIMARY: Medicare a and bPolicy#: [id number] aSubscriber: pt Benefits: Phone #: passport one onlineName: 07/09/16 Eff. Date: 3/1/2013Deduct: $1340Out of Pocket Max: noneLife Max: none CIR: 100%SNF: 20 full days Outpatient:80%Co-Pay: 20% Home Health: 100%Co-Pay: none DME: 80%Co-Pay: 20% Providers: pt choice  SECONDARY: BCBS of [place] supplement Policy#: [id number] Subscriber: pt  Medicaid Application Date:Case Manager: Disability Application Date:Case Worker:  Emergency [place]   Name [place] Daughter   [phone]   Noralee Space Daughter [phone]      Current Medical History Patient Admitting Diagnosis: right BKA with history of L BKA  History of Present Illness:   : HPI: [name] a 61 y.o.right handed femalewith history of hypertension, diabetes mellitus, CAD, chronic back pain, end-stage renal disease with hemodialysis, mechanical aortic valve on chronic Coumadin, history of liver transplant, left BKA March 2018 and received  inpatient rehabilitation services. She was discharged to home with home health therapies. Essentially wheelchair bound prior to admission. She has yet to receive a prosthesis as prior BKA is healing. Presented 06/30/2016 with gangrenous right heel and right great toe with findings of infrainguinal arterial occlusive disease. No change with conservative care and underwent right below knee amputation 07/08/2016 per Dr. [name]. Hospital course pain management.Teacher, music for fitting of Limb guard.Acute on chronic anemia 9.4and monitor. Leukocytosis 20,600-23,000. Presently on intravenous heparin and chronic Coumadin resumed. Hemodialysis ongoing as per renal services.  Past Medical History     Past Medical History:  Diagnosis Date  . Anemia    takes Folic Acid daily  . Anxiety   . Aortic stenosis   . Arthritis    "left hand, back" (08/30/2012)  . Asthma   . CAD (coronary artery disease)   . CAD (coronary artery disease) Jan. 2015   Cath: 20% LAD, 50% D1; s/p LIMA-LAD  . Chest pain 03/06/2015  . CHF (congestive heart failure) ([place]) 05/2016  . Chronic back pain   . Chronic bronchitis ([place])    "q yr w/season changes" (08/30/2012)  . Chronic constipation    takes MIralax and Colace daily  . COPD (chronic obstructive pulmonary disease) ([place])   . Depression    takes Cymbalta for "severe" depression  . End stage renal disease on dialysis [facility]) 02/27/2011   Kidneys shut down at time of liver transplant in Sept 2011 at [facility] in [place], she has been on HD ever since. Dialyzes at [facility] HD on TTS schedule. Had L forearm graft used 10 months then removed Dec 2012 due to suspected infection. A right upper arm AVG was placed Dec 2012 but she developed steal symptoms acutely and it was ligated the same day. Never had an AV fistula due to small veins. Now has L  thigh AVG put in Jan 2013, has not clotted to date.   [name] GERD (gastroesophageal reflux disease)     takes Omeprazole daily  . Headache    "at least monthly" (08/30/2012)  . Hepatitis C   . History of blood transfusion    "several" (08/30/2012)  . Hypertension    takes Metoprolol and Lisinopril daily, sees Dr Bea Graff  . Hypothyroidism    takes Synthroid daily  . Migraine    "last migraine was in 2013" (08/30/2012)  . Neuromuscular disorder ([place])    carpal tunnel in right hand  . Obesity   . Peripheral vascular disease (HCC) hands and legs  . Pneumonia    "today and several times before" (08/30/2012)  . 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 S/P unilateral BKA (below knee amputation), left ([place]) 04/30/2016  . SVT (supraventricular tachycardia) ([place]) 06/09/14  . Tobacco abuse   . Type II diabetes mellitus (HCC)    Levemir 2units daily if > 150    Family History family history includes Cancer in her father and mother; Diabetes in her mother; Hypertension in her mother; Stroke in her mother.  Prior Rehab/Hospitalizations: Has the patient had major surgery during 100 days prior to admission? Yes  Left BKA 04/2016 and received CIR admission with discharge home with family and home health.  Current Medications   Current Facility-Administered Medications:  . 0.9 % sodium chloride infusion, , Intravenous, Continuous, Belinda Block, MD, Stopped at 07/08/16 1359 . acetaminophen (TYLENOL) tablet 650 mg, 650 mg, Oral, Q6H PRN **OR** acetaminophen (TYLENOL) suppository 650 mg, 650 mg, Rectal, Q6H PRN, Opyd, Timothy S, MD . aspirin EC tablet 81 mg, 81 mg, Oral, Daily, Opyd, Ilene Qua, MD, 81 mg at 07/10/16 0954 . calcitRIOL (ROCALTROL) capsule 0.5 mcg, 0.5 mcg, Oral, Q M,W,F-HD, Opyd, Ilene Qua, MD, 0.5 mcg at 07/09/16 1250 . calcium acetate (PHOSLO) capsule 667 mg, 667 mg, Oral, TID WC, [name], PA-C,  667 mg at 07/10/16 1258 . cefTRIAXone (ROCEPHIN) 2 g in dextrose 5 % 50 mL IVPB, 2 g, Intravenous, Q24H, Stopped at 07/10/16 0814 **AND** [DISCONTINUED] metroNIDAZOLE (FLAGYL) IVPB 500 mg, 500 mg, Intravenous, Q8H, Opyd, Ilene Qua, MD, Stopped at 07/09/16 0510 . Chlorhexidine Gluconate Cloth 2 % PADS 6 each, 6 each, Topical, Daily, Rai, Ripudeep K, MD, 6 each at 07/08/16 1000 . Darbepoetin Alfa (ARANESP) injection 60 mcg, 60 mcg, Intravenous, Q Mon-HD, Alric Seton, PA-C, 60 mcg at 07/07/16 0847 . docusate sodium (COLACE) capsule 100 mg, 100 mg, Oral, Daily, Elam Dutch, MD, 100 mg at 07/10/16 0954 . feeding supplement (BOOST / RESOURCE BREEZE) liquid 1 Container, 1 Container, Oral, TID BM, Patrecia Pour, MD, 1 Container at 07/10/16 1400 . ferric gluconate (NULECIT) 62.5 mg in sodium chloride 0.9 % 100 mL IVPB, 62.5 mg, Intravenous, Q Sat-HD, Alric Seton, PA-C, Stopped at 07/05/16 1214 . gi cocktail (Maalox,Lidocaine,Donnatal), 30 mL, Oral, BID PRN, Rai, Ripudeep K, MD, 30 mL at 07/08/16 0818 . heparin ADULT infusion 100 units/mL (25000 units/238mL sodium chloride 0.45%), 1,050 Units/hr, Intravenous, Continuous, Franky Macho, RPH, Last Rate: 10.5 mL/hr at 07/09/16 1037, 1,050 Units/hr at 07/09/16 1037 . hydrALAZINE (APRESOLINE) injection 5 mg, 5 mg, Intravenous, Q20 Min PRN, Fields, Charles E, MD . HYDROmorphone (DILAUDID) tablet 2-4 mg, 2-4 mg, Oral, Q6H PRN, Vance Gather  B, MD, 4 mg at 07/10/16 0740 . labetalol (NORMODYNE,TRANDATE) injection 10 mg, 10 mg, Intravenous, Q10 min PRN, Fields, Charles E, MD . levothyroxine (SYNTHROID, LEVOTHROID) tablet 175 mcg, 175 mcg, Oral, QAC breakfast, Opyd, Ilene Qua, MD, 175 mcg at 07/10/16 0740 . methocarbamol (ROBAXIN) tablet 250 mg, 250 mg, Oral, Q6H PRN, Opyd, Ilene Qua, MD, 250 mg at 07/10/16 0955 . metoCLOPramide (REGLAN) tablet 5 mg, 5 mg, Oral, BID AC, Opyd, Ilene Qua, MD, 5 mg at 07/10/16 0741 . metoprolol tartrate  (LOPRESSOR) injection 2-5 mg, 2-5 mg, Intravenous, Q2H PRN, Fields, Charles E, MD . metoprolol tartrate (LOPRESSOR) tablet 25 mg, 25 mg, Oral, BID, Opyd, Ilene Qua, MD, 25 mg at 07/10/16 0954 . multivitamin (RENA-VIT) tablet 1 tablet, 1 tablet, Oral, QHS, Opyd, Ilene Qua, MD, 1 tablet at 07/09/16 2147 . mupirocin ointment (BACTROBAN) 2 % 1 application, 1 application, Nasal, BID, Rai, Ripudeep K, MD, 1 application at 72/53/66 2148 . ondansetron (ZOFRAN) tablet 4 mg, 4 mg, Oral, Q6H PRN, 4 mg at 07/01/16 0838 **OR** ondansetron (ZOFRAN) injection 4 mg, 4 mg, Intravenous, Q6H PRN, Opyd, Ilene Qua, MD, 4 mg at 07/08/16 1356 . oxyCODONE (Oxy IR/ROXICODONE) immediate release tablet 5 mg, 5 mg, Oral, Q4H PRN, Pablo Ledger, Kimberly A, PA-C, 5 mg at 07/10/16 1258 . polyethylene glycol (MIRALAX / GLYCOLAX) packet 17 g, 17 g, Oral, BID, Opyd, Ilene Qua, MD, 17 g at 07/10/16 0955 . pregabalin (LYRICA) capsule 75 mg, 75 mg, Oral, Daily, Opyd, Ilene Qua, MD, 75 mg at 07/10/16 0954 . senna-docusate (Senokot-S) tablet 1 tablet, 1 tablet, Oral, BID, Rai, Ripudeep K, MD, 1 tablet at 07/10/16 0954 . tacrolimus (PROGRAF) capsule 2 mg, 2 mg, Oral, BID, Opyd, Ilene Qua, MD, 2 mg at 07/10/16 0955 . warfarin (COUMADIN) tablet 3 mg, 3 mg, Oral, ONCE-1800, Rolla Flatten, [facility] . Warfarin - Pharmacist Dosing Inpatient, , Does not apply, q1800, Rolla Flatten, [facility]  Patients Current Diet:Diet regular Room service appropriate? Yes; Fluid consistency: Thin  Precautions / Restrictions Precautions Precautions: Fall Restrictions Weight Bearing Restrictions: Yes RLE Weight Bearing: Non weight bearing LLE Weight Bearing: Non weight bearing  Has the patient had 2 or more falls or a fall with injury in the past year?No  Prior Activity Level Limited Community (1-2x/wk): goes to hemodialyiss 3 times per week and to [employer] / [place] Devices/Equipment:  Wheelchair Home Equipment: Bedside commode, Environmental consultant - 2 wheels, Wheelchair - manual  Prior Device Use: Indicate devices/aids used by the patient prior to current illness, exacerbation or injury? wheelchair level  Prior Functional Level Prior Function Level of Independence: Needs assistance Gait / Transfers Assistance Needed: w/c for mobility. daughter assisting with pivoting to w/c and toilet ADL's / Homemaking Assistance Needed: daughter assisting with ADL. only sponge bathing at this time  Self Care: Did the patient need help bathing, dressing, using the toilet or eating? Needed some help  Indoor Mobility: Did the patient need assistance with walking from room to room (with or without device)? Needed some help  Stairs: Did the patient need assistance with internal or external stairs (with or without device)? Needed some help  Functional Cognition: Did the patient need help planning regular tasks such as shopping or remembering to take medications? Needed some help  Current Functional Level Cognition  Overall Cognitive Status: Within Functional Limits for tasks assessed Orientation Level: Oriented X4 General Comments: Pt was in a lot of pain from R BKA and a "sore on her bottom"  Extremity Assessment (includes Sensation/Coordination)  Upper Extremity Assessment: Generalized weakness Lower Extremity Assessment: Defer to PT evaluation RLE Deficits / Details: dec ROM and extremely sensitive to pain RLE: Unable to fully assess due to pain LLE: Unable to fully assess due to pain   ADLs  Overall ADL's : Needs assistance/impaired Eating/Feeding: Set up, Bed level Grooming: Min guard, Sitting Upper Body Bathing: Minimal assistance, Sitting Lower Body Bathing: Total assistance, Bed level Upper Body Dressing : Minimal assistance, Sitting Lower Body Dressing: Total assistance, Bed level Toilet Transfer: Maximal assistance, Total assistance, +2 for physical assistance,  Anterior/posterior, BSC Toilet Transfer Details (indicate cue type and reason): Max assist to [facility], total assist for return to bed Functional mobility during ADLs: Maximal assistance, Total assistance, +2 for physical assistance (for AP transfer)   Mobility  Overal bed mobility: Needs Assistance Bed Mobility: Supine to Sit Supine to sit: Max assist Sit to supine: Mod assist, HOB elevated General bed mobility comments: Max A to sit upright in bed   Transfers  Overall transfer level: Needs assistance Equipment used: 2 person hand held assist Transfers: Lateral/Scoot Transfers Anterior-Posterior transfers: Max assist, Total assist, +2 physical assistance Lateral/Scoot Transfers: Max assist, +2 physical assistance General transfer comment: Max A lateral slide transfer from bed to recliner. Pt is more concerned about the pain and protecting her RLE than assistance with transfers.    Ambulation / Gait / Stairs / Scientist, clinical (histocompatibility and immunogenetics) / Balance Dynamic Sitting Balance Sitting balance - Comments: Pt able to perform RLE desentisization with bil UEs sitting EOB Balance Overall balance assessment: Needs assistance Sitting-balance support: Bilateral upper extremity supported Sitting balance-Leahy Scale: Fair Sitting balance - Comments: Pt able to perform RLE desentisization with bil UEs sitting EOB Postural control: Posterior lean   Special needs/care consideration BiPAP/CPAP N/a CPM N/a Continuous Drip IV heparin IV bridge to coumadin Dialysis M W F at [place] dialysis. Daughter transports her Life Vest N/a Oxygen N/a Special BedTrach Size N/a Wound Vac N/a Skin Stage II coccyx wound with foam dressing noted; Dressing changed by Dr. Scot Dock 07/10/16 to R BKA surgical site. [employer] to fit pt for a limb guard To right BKA. WOC :07/01/2016   WOC  Consult Note Date of Service: 07/01/2016 10:24 AM     [] Hide copied text [] Hover for  attribution information [place] Nurse wound consult note Reason for Consult: LE ulcer right foot, history of amputation on the left.  Patient with history of BKA per Dr. Oneida Alar a few months ago, wound has not healed fully Patient continues to smoke, counseled by Dr. Broadus John Patient reports new onset of limb pain in the right foot Wound type: Arterial ulceration: right great toe Mixed etiology arterial/pressure: right heel Non healing surgical site left BKA Pressure Injury POA: Yes Measurement: Right heel: 3cm x 3cm x0.1cm; 90% black eschar, not fluctuant, 10% pink at wound edges; no drainage  Right great toe: 3cm x 2cm x 0cm: 100% eschar, intact, no drainage Left stump site: 1.0cm x 3.5cm x 0.3; 50% scabbed/50%yellow; minimal drainage, no odor  Peri wound: intact, edema noted right foot, pulse not palpable, foot cool but not cold. Dressing procedure/placement/frequency:  ABIs pending Betadine to the right foot wounds to keep dry and stable until decision on need for surgical intervention Hydrogel to the stump wound, to add moisture for wound healing.  Discussed POC with patient and bedside nurse.  Re consult if needed, will not follow at this time. Thanks Mel[employer], RN,CWOCN,  CNS [phone])          Bowel mgmt: continent LBM 07/08/16 Bladder mgmt: anuric Diabetic mgmt Yes Hgb A1c 5.7 Out patient hemodialysis  MWF   Previous Home Environment Living Arrangements: Children Lives With: Family Available Help at Discharge: Family, Available PRN/intermittently Type of Home: Mobile home Home Layout: One level Home Access: Ramped entrance Bathroom Shower/Tub: Chiropodist: Handicapped height Bathroom Accessibility: Yes How Accessible: Accessible via wheelchair [place]: Yes Type of [place]: [place] (if known): [facility] Additional Comments: A ramp has been built for her to enter  home  Discharge Living Setting Plans for Discharge Living Setting: Patient's home, Lives with (comment) (daughter, [name]) Type of Home at Discharge: Mobile home Discharge Home Layout: One level Discharge Home Access: [place] entrance Discharge Bathroom Shower/Tub: [place] unit Discharge Bathroom Toilet: Handicapped height Discharge Bathroom Accessibility: Yes How Accessible: Accessible via wheelchair Does the patient have any problems obtaining your medications?: Yes (Describe)  Social/Family/Support Systems Patient Roles: Parent Contact Information: [name], daughter and medical POA Anticipated Caregiver: [name] and Water quality scientist, two daughters Anticipated Caregiver's Contact Information: see above Discharge Plan Discussed with Primary Caregiver: Yes Is Caregiver In Agreement with Plan?: Yes Does Caregiver/Family have Issues with Lodging/Transportation while Pt is in Rehab?: No  [name] in school, but her sister, Nira Conn or her boyfriend stay with pt when she is in school. They provided 24/7 supervision after d/c from CIR last time for about 3 weeks.  Goals/Additional Needs Patient/Family Goal for Rehab: supervision to min assist wheelchair level for PT and OT Expected length of stay: ELOS 10- 14 days Special Service Needs: Hemodialysis three times per week Pt/Family Agrees to Admission and willing to participate: Yes Program Orientation Provided &Reviewed with Pt/Caregiver Including Roles &Responsibilities: Yes  Decrease burden of Care through IP rehab admission: n/a  Possible need for SNF placement upon discharge: not anticipated  Patient Condition:This patient's condition remains as documented in the consult dated 07/09/2016, in which the Rehabilitation Physician determined and documented that the patient's condition is appropriate for intensive rehabilitative care in an inpatient rehabilitation facility. Will admit to inpatient rehab today.  Preadmission Screen  Completed By: Cleatrice Burke, 5/31/20181:59 PM ______________________________________________________________________  Discussed status with Dr. Naaman Plummer on 07/10/2016 at 1341and received telephone approval for admission today.  Admission Coordinator: Cleatrice Burke, time 1341 Date 07/10/2016.       Cosigned by: [name], MD at 07/10/2016 2:27 PM   Revision History

## 2016-07-10 NOTE — Progress Notes (Signed)
Report given to Stacy at 4W.

## 2016-07-10 NOTE — Progress Notes (Signed)
I met with pt at bedside and she is in agreement to an inpt rehab admission when MDs feels medically ready. I discussed with Dr. Bonner Puna and await Dr. Naaman Plummer to assess pt for possible admit today. Noted WBC 23.5 today and afebrile. I will follow up today. [phone]

## 2016-07-10 NOTE — Discharge Summary (Signed)
Physician Discharge Summary  [name] LZJ:[id number] DOB: September 01, 1955 DOA: 06/30/2016  PCP: [name], MD  Admit date: 06/30/2016 Discharge date: 07/10/2016  Admitted From: Home Disposition: CIR   Recommendations for Outpatient Follow-up:  - Monitor fever curve. Leukocytosis has continued s/p BKA, suspected to be reactive with no fevers. Plan to discontinue antibiotics now that 48 hours postop. - Continue heparin bridge to coumadin for mechanical valve. - Follow up with PCP in 1-2 weeks following CIR discharge.  Discharge Condition: Stable CODE STATUS: Partial: Wants all measures except intubation. Diet recommendation: As tolerated  Brief/Interim Summary: [name] Eischeid [name] a 61 y.o.femalewith medical history significant for hypertension, diabetes, coronary artery disease, chronic back pain, depression, end-stage renal disease on HD, mechanical aortic valve on warfarin, and history of liver transplantation, H/o PAD s/p L BKA in 3/18who presented to the emergency department for evaluation of wounds on the right foot and left BKA stump. She is s/p right BKA 5/29 by Dr. Scot Dock. Following the procedure, antibiotics were continued for 48 hours, though leukocytosis persisted, she's remained afebrile and generally improving. Heparin and coumadin were restarted following the procedure as well. She will [facility] at [facility].   Discharge Diagnoses:  Principal Problem:   Cellulitis Active Problems:   Hypothyroidism   CAD (coronary artery disease), native coronary artery   Chronic combined systolic and diastolic congestive heart failure (HCC)   COPD (chronic obstructive pulmonary disease) (HCC)   Chronic anticoagulation w/ coumadin, goal INR 2.5-3.0 w/ AVR   Chronic midline low back pain without sciatica   ESRD on dialysis ([place])   H/O heart valve replacement with mechanical valve   PAD (peripheral artery disease) (HCC)   Diabetic foot infection (HCC)   Malnutrition of  moderate degree   History of liver transplant ([place])   Post-operative pain   Unilateral complete BKA, right, subsequent encounter ([place])   S/P bilateral BKA (below knee amputation) ([place])  Right foot nonhealing ulcers with gangrenous changes with underlying PVD: Underlying risk factors including diabetes, ongoing tobacco abuse, ESRD. Patient underwent arteriogram on 5/25 by vascular surgery, no suitable endovascular or operative interventions that would improve flow to the right foot. Left could not be obtained due to BKA - S/p left BKA 3 months ago, s/p right BKA 5/29. - Augmented dilaudid dose to 4mg  prn with good effect, will continue ongoing titration down to oral analgesics. - Monitor leukocytosis and fever curve. Stopped ceftriaxone and flagyl post operatively.   Pressure ulcers: Right heel and great toe resolved s/p BKA. Left stump site: 1.0cm x 3.5cm x 0.3; minimal drainage, no odor - Continue wound care and offloading.  Mechanical AVR - Continue IV heparin bridge back to coumadin  Lab Results  Component Value Date   INR 1.78 07/10/2016   INR 1.60 07/09/2016   INR 1.50 07/08/2016   ESRD on hemodialysis MWF - Nephrology following, on schedule.  Coronary artery disease - Currently stable, no chest pain, continue aspirin, beta blocker  Diabetes mellitus with hypoglycemia: A1c is 5.7% - Monitor off medications.  History of liver transplantation - Continue prograf  Hypertension - Currently stable, continue Lopressor  Constipation: Resolved  Discharge Instructions  Allergies as of 07/10/2016      Reactions   Acetaminophen Other (See Comments)   Liver transplant recipient    Codeine Itching   Mirtazapine Other (See Comments)   hallucination      Medication List    STOP taking these medications   clindamycin 150 MG capsule Commonly known  as:  CLEOCIN   oxyCODONE 5 MG immediate release tablet Commonly known as:  Oxy IR/ROXICODONE   traMADol 50 MG  tablet Commonly known as:  ULTRAM     TAKE these medications   aspirin EC 81 MG tablet Take 81 mg by mouth daily at 12 noon.   calcitRIOL 0.5 MCG capsule Commonly known as:  ROCALTROL Take 1 capsule (0.5 mcg total) by mouth every Monday, Wednesday, and Friday with hemodialysis.   collagenase ointment Commonly known as:  SANTYL Apply 1 application topically daily.   feeding supplement Liqd Take 1 Container by mouth 3 (three) times daily between meals.   HYDROmorphone 2 MG tablet Commonly known as:  DILAUDID Take 1-2 tablets (2-4 mg total) by mouth every 6 (six) hours as needed for severe pain. What changed:  how much to take  when to take this   levothyroxine 175 MCG tablet Commonly known as:  SYNTHROID, LEVOTHROID Take 175 mcg by mouth daily at 12 noon.   methocarbamol 500 MG tablet Commonly known as:  ROBAXIN Take 0.5 tablets (250 mg total) by mouth every 6 (six) hours as needed for muscle spasms.   metoCLOPramide 5 MG tablet Commonly known as:  REGLAN Take 1 tablet (5 mg total) by mouth 2 (two) times daily before a meal. Decrease to once a day before breakfast after a couple of weeks. What changed:  when to take this  additional instructions   metoprolol tartrate 25 MG tablet Commonly known as:  LOPRESSOR Take 1 tablet (25 mg total) by mouth 2 (two) times daily. What changed:  when to take this  additional instructions   multivitamin Tabs tablet Take 1 tablet by mouth at bedtime.   nitroGLYCERIN 0.4 MG SL tablet Commonly known as:  NITROSTAT Place 1 tablet (0.4 mg total) under the tongue every 5 (five) minutes as needed for chest pain.   ondansetron 4 MG disintegrating tablet Commonly known as:  ZOFRAN ODT Take 1 tablet (4 mg total) by mouth every 8 (eight) hours as needed for nausea.   pantoprazole 40 MG tablet Commonly known as:  PROTONIX Take 1 tablet (40 mg total) by mouth daily.   polyethylene glycol packet Commonly known as:  MIRALAX /  GLYCOLAX Take 17 g by mouth 2 (two) times daily. What changed:  when to take this  reasons to take this  additional instructions   pregabalin 75 MG capsule Commonly known as:  LYRICA Take 1 capsule (75 mg total) by mouth daily. What changed:  when to take this   senna-docusate 8.6-50 MG tablet Commonly known as:  Senokot-S Take 1 tablet by mouth daily at 12 noon.   tacrolimus 1 MG capsule Commonly known as:  PROGRAF Take 2 capsules (2 mg total) by mouth 2 (two) times daily. Take 2 capsules twice daily. What changed:  additional instructions   TUMS PO Take 1 tablet by mouth daily with lunch.   warfarin 2.5 MG tablet Commonly known as:  COUMADIN Take 1 tablet (2.5 mg total) by mouth daily at 6 PM. What changed:  how much to take  when to take this  additional instructions       Allergies  Allergen Reactions  . Acetaminophen Other (See Comments)    Liver transplant recipient   . Codeine Itching  . Mirtazapine Other (See Comments)    hallucination    Consultations:  Vascular surgery  Orthopedic surgery  Nephrology  Procedures/Studies: Dg Tibia/fibula Left  Result Date: 06/30/2016 CLINICAL DATA:  Amputation wound,  check for bony destruction EXAM: LEFT TIBIA AND FIBULA - 2 VIEW COMPARISON:  None. FINDINGS: Changes consistent with a below the knee amputation are seen. Mild osteopenia is noted. No bony erosion to suggest osteomyelitis is seen. IMPRESSION: No evidence of osteomyelitis. Electronically Signed   By: Inez Catalina M.D.   On: 06/30/2016 18:48   Dg Foot Complete Right  Result Date: 06/30/2016 CLINICAL DATA:  Open wound that will not heal since April. Wounds of the first digit and heel. EXAM: RIGHT FOOT COMPLETE - 3+ VIEW COMPARISON:  None. FINDINGS: The patient's heel wound is subtly visible. No soft tissue emphysema or opaque foreign body. No erosion to suggest osteomyelitis. Osteopenia and diffuse arterially calcification. IMPRESSION: No evidence of  osteomyelitis or opaque foreign body. Electronically Signed   By: Monte Fantasia M.D.   On: 06/30/2016 13:41   ABI Right - ABI could not be ascertained due to non compressible vessel in the dorsalis pedis artery. The ABI calculated from the posterior tibial artery indicate s a severe reduction in arterial flow at res. - Left - ABI cannot be obtained due to below the knee amputation.  07/04/16: Abdominal aortogram with right lower extremity runoff  07/08/2016: Right BKA  Subjective: Pain much better controlled. Moving bowels. Eating. Ready to go to CIR. No fevers, chills, night sweats.  Discharge Exam: BP (!) 120/48 (BP Location: Left Arm)   Pulse 79   Temp 98.3 F (36.8 C) (Oral)   Resp 18   Ht 5\' 4"  (1.626 m)   Wt 53.5 kg (117 lb 15.1 oz)   SpO2 94%   BMI 20.25 kg/m   General: Pt is alert, awake, not in acute distress Cardiovascular: RRR, S1/S2 +, no rubs, no gallops Respiratory: CTA bilaterally, no wheezing, no rhonchi Abdominal: Soft, NT, ND, bowel sounds + Extremities: Left BKA stump normal with 1.0cm x 3.5cm x 0.3. Minimal drainage, no odor. Right BKA dressing c/d/i. Reports pain in proximal leg without overlying erythema, swelling, deformity.   Labs: Basic Metabolic Panel:  Recent Labs Lab 07/04/16 0425 07/07/16 0708 07/08/16 0739 07/09/16 0938 07/10/16 0629  NA 134* 133* 130* 130* 130*  K 3.3* 3.5 4.4 4.2 3.9  CL 96* 97* 98* 97* 96*  CO2 25 24 23 24 23   GLUCOSE 156* 127* 104* 151* 166*  BUN 16 15 7 12 6   CREATININE 5.26* 6.43* 4.01* 5.28* 3.39*  CALCIUM 8.8* 9.0 9.1 9.0 8.9  PHOS  --  5.8*  --  5.1* 4.3   Liver Function Tests:  Recent Labs Lab 07/07/16 0708 07/09/16 0938 07/10/16 0629  ALBUMIN 1.7* 1.8* 1.8*   CBC:  Recent Labs Lab 07/07/16 0709 07/08/16 0739 07/09/16 0338 07/09/16 0938 07/10/16 0629  WBC 12.6* 16.5* 16.1* 20.6* 23.5*  HGB 8.9* 10.3* 9.2* 8.4* 9.4*  HCT 25.2* 29.6* 26.2* 24.1* 26.9*  MCV 82.6 82.2 83.2 82.0 83.0   PLT 130* 140* 154 135* 137*   Urinalysis    Component Value Date/Time   COLORURINE YELLOW 11/02/2012 0428   APPEARANCEUR CLOUDY (A) 11/02/2012 0428   LABSPEC 1.011 11/02/2012 0428   PHURINE 8.0 11/02/2012 0428   GLUCOSEU 250 (A) 11/02/2012 0428   HGBUR LARGE (A) 11/02/2012 0428   BILIRUBINUR NEGATIVE 11/02/2012 0428   KETONESUR NEGATIVE 11/02/2012 0428   PROTEINUR >300 (A) 11/02/2012 0428   UROBILINOGEN 0.2 11/02/2012 0428   NITRITE NEGATIVE 11/02/2012 0428   LEUKOCYTESUR NEGATIVE 11/02/2012 0428    Microbiology Recent Results (from the past 240 hour(s))  MRSA PCR  Screening     Status: Abnormal   Collection Time: 06/30/16 10:15 PM  Result Value Ref Range Status   MRSA by PCR INVALID RESULTS, SPECIMEN SENT FOR CULTURE (A) NEGATIVE Final    Comment: Results Called toChapman Moss RN, AT [phone] 07/01/16 BY D.VANHOOK   MRSA culture     Status: None   Collection Time: 06/30/16 10:15 PM  Result Value Ref Range Status   Specimen Description NASAL SWAB  Final   Special Requests NONE  Final   Culture NO MRSA DETECTED  Final   Report Status 07/03/2016 FINAL  Final  Blood Cultures x 2 sites     Status: None   Collection Time: 06/30/16 10:39 PM  Result Value Ref Range Status   Specimen Description BLOOD LEFT HAND  Final   Special Requests   Final    BOTTLES DRAWN AEROBIC AND ANAEROBIC Blood Culture adequate volume   Culture NO GROWTH 5 DAYS  Final   Report Status 07/06/2016 FINAL  Final  Blood Cultures x 2 sites     Status: None   Collection Time: 06/30/16 10:47 PM  Result Value Ref Range Status   Specimen Description BLOOD RIGHT HAND  Final   Special Requests   Final    BOTTLES DRAWN AEROBIC AND ANAEROBIC Blood Culture adequate volume   Culture NO GROWTH 5 DAYS  Final   Report Status 07/06/2016 FINAL  Final  Surgical pcr screen     Status: Abnormal   Collection Time: 07/07/16  8:15 PM  Result Value Ref Range Status   MRSA, PCR NEGATIVE NEGATIVE Final   Staphylococcus aureus  POSITIVE (A) NEGATIVE Final    Comment:        The Xpert SA Assay (FDA approved for NASAL specimens in patients over 27 years of age), is one component of a comprehensive surveillance program.  Test performance has been validated by [facility] for patients greater than or equal to 62 year old. It is not intended to diagnose infection nor to guide or monitor treatment.     Time coordinating discharge: Approximately 40 minutes  Vance Gather, MD  [facility] 07/10/2016, 1:47 PM Pager [phone]

## 2016-07-10 NOTE — Progress Notes (Signed)
Patient ID: [name], female   DOB: [birth year], 61 y.o.   MRN: [id number] Patient arrived to unit with RN, family, and belongings. Patient oriented to room, nurse call system, health resource notebook, fall prevention plan, rehab safety plan, and rehab schedule. Patient sitting in recliner with dinner tray delivered to her.

## 2016-07-10 NOTE — Progress Notes (Deleted)
Jamse Arn, MD Physician Signed Physical Medicine and Rehabilitation  Consult Note Date of Service: 07/09/2016 10:33 AM        Expand All Collapse All              Hide copied text    Hover for attribution information                                                                                                                              untitled image              Physical Medicine and Rehabilitation Consult  Reason for Consult: Decreased functional mobility  Referring Physician: [facility]        HPI: [name] is a 61 y.o. 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 She was discharged to home with home health therapies. Per chart review patient lives with daughter. Essentially wheelchair bound prior to admission. She has yet to receive a prosthesis as prior BKA is healing. One level home with rehabilitation. Presented 06/30/2016 with gangrenous right heel and right great toe with findings of infrainguinal arterial occlusive disease. No change with conservative care and underwent right below knee amputation 07/08/2016 per Dr. [name]. Hospital course pain management. Acute on chronic anemia 8.4 and monitor. Leukocytosis 20,600. Presently on intravenous heparin and await plan to resume chronic Coumadin. Hemodialysis ongoing as per renal services. Therapy evaluations pending. M.D. has requested physical medicine rehabilitation consult.        Review of Systems   Constitutional: Negative for chills and fever.   HENT: Negative for hearing loss.    Eyes: Negative  for blurred vision and double vision.   Respiratory: Positive for cough and shortness of breath.    Cardiovascular: Positive for palpitations and leg swelling. Negative for chest pain.   Gastrointestinal: Positive for constipation. Negative for nausea and vomiting.        GERD   Musculoskeletal: Positive for back pain and myalgias.   Skin: Negative for rash.   Neurological: Positive for weakness and headaches. Negative for seizures.   Psychiatric/Behavioral:        Anxiety   All other systems reviewed and are negative.          Past Medical History:    Diagnosis   Date    .   Anemia            takes Folic Acid daily    .   Anxiety        .   Aortic stenosis        .   Arthritis            "left hand, back" (08/30/2012)    .  Asthma        .   CAD (coronary artery disease)        .   CAD (coronary artery disease)   Jan. 2015        Cath: 20% LAD, 50% D1; s/p LIMA-LAD    .   Chest pain   03/06/2015    .   CHF (congestive heart failure) ([place])   05/2016    .   Chronic back pain        .   Chronic bronchitis ([place])            "q yr w/season changes" (08/30/2012)    .   Chronic constipation            takes MIralax and Colace daily    .   COPD (chronic obstructive pulmonary disease) ([place])        .   Depression            takes Cymbalta for "severe" depression    .   End stage renal disease on dialysis [facility])   02/27/2011        Kidneys shut down at time of liver transplant in Sept 2011 at [facility] in [place], she has been on HD ever since.  Dialyzes at [facility] HD on TTS schedule.  Had L forearm graft used 10 months then removed Dec 2012 due to suspected infection.  A right upper arm AVG was placed Dec 2012 but she developed steal symptoms acutely and it was ligated the same day.  Never had an AV fistula due to small veins.  Now has L thigh AVG put in Jan  2013, has not clotted to date.      [name]   GERD (gastroesophageal reflux disease)            takes Omeprazole daily    .   Headache            "at least monthly" (08/30/2012)    .   Hepatitis C        .   History of blood transfusion            "several" (08/30/2012)    .   Hypertension            takes Metoprolol and Lisinopril daily, sees Dr Bea Graff    .   Hypothyroidism            takes Synthroid daily    .   Migraine            "last migraine was in 2013" (08/30/2012)    .   Neuromuscular disorder ([place])            carpal tunnel in right hand    .   Obesity        .   Peripheral vascular disease (HCC)   hands and legs    .   Pneumonia            "today and several times before" (08/30/2012)    .   S/P aortic valve replacement   03/15/13        Mechanical     .   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   S/P unilateral BKA (below knee amputation), left ([place])   04/30/2016    .  SVT (supraventricular tachycardia) ([place])   06/09/14    .   Tobacco abuse        .   Type II diabetes mellitus (HCC)            Levemir 2units daily if > 150             Past Surgical History:    Procedure   Laterality   Date    .   ABDOMINAL AORTOGRAM W/LOWER EXTREMITY   N/A   07/04/2016        Procedure: Abdominal Aortogram w/Lower Extremity;  Surgeon: Elam Dutch, MD;  Location: [place] CV LAB;  Service: Cardiovascular;  Laterality: N/A;    .   AMPUTATION   Left   04/24/2016        Procedure: AMPUTATION BELOW KNEE;  Surgeon: Angelia Mould, MD;  Location: [place];  Service: Vascular;  Laterality: Left;    .   AMPUTATION   Right   07/08/2016        Procedure: AMPUTATION BELOW KNEE;  Surgeon: Angelia Mould, MD;   Location: [place];  Service: Vascular;  Laterality: Right;    .   AORTIC VALVE REPLACEMENT   N/A   03/15/2013        AVR; Surgeon: Ivin Poot, MD;  Location: [facility] OR; Open Heart Surgery;  53mm[employer] mechanical prosthesis, top hat valve    .   ARTERIOVENOUS GRAFT PLACEMENT   Left   10/03/10         forearm    .   AV FISTULA PLACEMENT       01/29/2011        Procedure: INSERTION OF ARTERIOVENOUS (AV) GORE-TEX GRAFT ARM;  Surgeon: Elam Dutch, MD;  Location: [place] OR;  Service: Vascular;  Laterality: Right;    .   AV FISTULA PLACEMENT       03/10/2011        Procedure: INSERTION OF ARTERIOVENOUS (AV) GORE-TEX GRAFT THIGH;  Surgeon: Elam Dutch, MD;  Location: [place];  Service: Vascular;  Laterality: Left;    .   [place] REMOVAL       12/23/2010        Procedure: REMOVAL OF ARTERIOVENOUS GORETEX GRAFT ([place]);  Surgeon: Elam Dutch, MD;  Location: [place];  Service: Vascular;  Laterality: Left;  procedure started @1736 -1852    .   CHOLECYSTECTOMY       1993    .   CORONARY ARTERY BYPASS GRAFT   N/A   03/15/2013        Procedure: CORONARY ARTERY BYPASS GRAFTING (CABG) times one using left internal mammary artery.;  Surgeon: Ivin Poot, MD;  Location: [place];  Service: Open Heart Surgery;  Laterality: N/A;  POSS CABG X 1    .   CYSTOSCOPY       1990's    .   INSERTION OF DIALYSIS CATHETER       12/23/2010        Procedure: INSERTION OF DIALYSIS CATHETER;  Surgeon: Elam Dutch, MD;  Location: [place];  Service: Vascular;  Laterality: Right;  Right Internal Jugular 28cm dialysis catheter insertion procedure time 1701-1720     .   INSERTION OF DIALYSIS CATHETER   Right   03/11/2016        Procedure: INSERTION OF DIALYSIS CATHETER;  Surgeon: Angelia Mould, MD;  Location: [place];  Service: Vascular;  Laterality: Right;    .  INTRAOPERATIVE TRANSESOPHAGEAL ECHOCARDIOGRAM    N/A   03/15/2013        Procedure: INTRAOPERATIVE TRANSESOPHAGEAL ECHOCARDIOGRAM;  Surgeon: Ivin Poot, MD;  Location: [place];  Service: Open Heart Surgery;  Laterality: N/A;    .   LEFT HEART CATHETERIZATION WITH CORONARY ANGIOGRAM   N/A   07/29/2012        Procedure: LEFT HEART CATHETERIZATION WITH CORONARY ANGIOGRAM;  Surgeon: Troy Sine, MD;  Location[facility] C[facility] LAB;  Service: Cardiovascular;  Laterality: N/A;    .   LEFT HEART CATHETERIZATION WITH CORONARY ANGIOGRAM   N/A   03/10/2013        Procedure: LEFT HEART CATHETERIZATION WITH CORONARY ANGIOGRAM;  Surgeon: Troy Sine, MD;  Location: [facility] C[facility] LAB;  Service: Cardiovascular;  Laterality: N/A;    .   LEFT HEART CATHETERIZATION WITH CORONARY/GRAFT ANGIOGRAM   N/A   12/24/2011        Procedure: LEFT HEART CATHETERIZATION WITH Beatrix Fetters;  Surgeon: [name], MD;  Location: [facility] CATH LAB;  Service: Cardiovascular;  Laterality: N/A;    .   LEFT HEART CATHETERIZATION WITH CORONARY/GRAFT ANGIOGRAM   N/A   12/16/2013        Procedure: LEFT HEART CATHETERIZATION WITH Beatrix Fetters;  Surgeon: Troy Sine, MD;  Location: [facility] CATH LAB;  Service: Cardiovascular;  Laterality: N/A;    .   LIGATION ARTERIOVENOUS GORTEX GRAFT   Left   03/11/2016        Procedure: LIGATION THIGH ARTERIOVENOUS GORTEX GRAFT;  Surgeon: Angelia Mould, MD;  Location: [place];  Service: Vascular;  Laterality: Left;    .   LIVER TRANSPLANT       10/25/2009        sees Dr Ferol Luz 1 every 6 months, saw last in Dec 2013. [name] Coord [phone]    .   PERIPHERAL VASCULAR CATHETERIZATION   N/A   11/06/2015        Procedure: Abdominal Aortogram;  Surgeon: Serafina Mitchell, MD;  Location: [place] CV LAB;  Service: Cardiovascular;  Laterality: N/A;    .   PERIPHERAL VASCULAR CATHETERIZATION   N/A   11/06/2015        Procedure: Lower  Extremity Angiography;  Surgeon: Serafina Mitchell, MD;  Location: [place] CV LAB;  Service: Cardiovascular;  Laterality: N/A;    .   PERIPHERAL VASCULAR CATHETERIZATION       11/06/2015        Procedure: Peripheral Vascular Atherectomy;  Surgeon: Serafina Mitchell, MD;  Location: [place] CV LAB;  Service: Cardiovascular;;  Left Superficial femoral    .   SHUNTOGRAM   Left   05/15/2014        Procedure: SHUNTOGRAM;  Surgeon: Conrad [place] Washington, MD;  Location: [facility] CATH LAB;  Service: Cardiovascular;  Laterality: Left;    .   SMALL INTESTINE SURGERY       90's    .   SPINAL GROWTH RODS       2010        "put 2 metal rods in my back; they had detetriorated" (08/30/2012)    .   THROMBECTOMY            .   THROMBECTOMY AND REVISION OF ARTERIOVENTOUS (AV) GORETEX  GRAFT   Left   03/30/2014    .   THROMBECTOMY AND REVISION OF ARTERIOVENTOUS (AV) GORETEX  GRAFT   Left   03/30/2014  Procedure: THROMBECTOMY AND REVISION OF ARTERIOVENTOUS (AV) GORETEX  GRAFT;  Surgeon: Conrad Pesotum, MD;  Location: [place];  Service: Vascular;  Laterality: Left;    .   TUBAL LIGATION       1990's             Family History    Problem   Relation   Age of Onset    .   Cancer   Mother        .   Diabetes   Mother        .   Hypertension   Mother        .   Stroke   Mother        .   Cancer   Father        .   Anesthesia problems   Neg Hx        .   Hypotension   Neg Hx        .   Malignant hyperthermia   Neg Hx        .   Pseudochol deficiency   Neg Hx           Social History:  reports that she has been smoking Cigarettes.  She has a 20.00 pack-year smoking history. She has never used smokeless tobacco. She reports that she does not drink alcohol or use drugs.  Allergies:         Allergies    Allergen   Reactions    .   Acetaminophen   Other  (See Comments)            Liver transplant recipient     .   Codeine   Itching    .   Mirtazapine   Other (See Comments)            hallucination              Medications Prior to Admission    Medication   Sig   Dispense   Refill    .   aspirin EC 81 MG tablet   Take 81 mg by mouth daily at 12 noon.             .   Calcium Carbonate Antacid (TUMS PO)   Take 1 tablet by mouth daily with lunch.            .   clindamycin (CLEOCIN) 150 MG capsule   Take 150 mg by mouth 3 (three) times daily. 7 day course started 06/29/16 pm            .   collagenase (SANTYL) ointment   Apply 1 application topically daily.            [name]   levothyroxine (SYNTHROID, LEVOTHROID) 175 MCG tablet   Take 175 mcg by mouth daily at 12 noon.            .   methocarbamol (ROBAXIN) 500 MG tablet   Take 0.5 tablets (250 mg total) by mouth every 6 (six) hours as needed for muscle spasms.   30 tablet   0    .   metoCLOPramide (REGLAN) 5 MG tablet   Take 1 tablet (5 mg total) by mouth 2 (two) times daily before a meal. Decrease to once a day before breakfast after a couple of weeks. (Patient taking differently: Take 5 mg by mouth daily before lunch. )   30 tablet   0    .  metoprolol tartrate (LOPRESSOR) 25 MG tablet   Take 1 tablet (25 mg total) by mouth 2 (two) times daily. (Patient taking differently: Take 25 mg by mouth See admin instructions. Take 1 tablet (25 mg) by mouth twice daily - 11am and 11pm)   60 tablet   0    .   multivitamin (RENA-VIT) TABS tablet   Take 1 tablet by mouth at bedtime.   30 tablet   0    .   nitroGLYCERIN (NITROSTAT) 0.4 MG SL tablet   Place 1 tablet (0.4 mg total) under the tongue every 5 (five) minutes as needed for chest pain.   30 tablet   0    .   ondansetron (ZOFRAN ODT) 4 MG disintegrating tablet   Take 1 tablet (4 mg total) by mouth every 8 (eight) hours  as needed for nausea.   30 tablet   0    .   oxyCODONE (OXY IR/ROXICODONE) 5 MG immediate release tablet   Take 1 tablet (5 mg total) by mouth 2 (two) times daily as needed for severe pain or breakthrough pain. (Patient taking differently: Take 5 mg by mouth every 8 (eight) hours as needed for severe pain or breakthrough pain. )   30 tablet   0    .   polyethylene glycol (MIRALAX / GLYCOLAX) packet   Take 17 g by mouth 2 (two) times daily. (Patient taking differently: Take 17 g by mouth 2 (two) times daily as needed (constipation). Mix in 8 oz liquid and drink)   60 each   0    .   pregabalin (LYRICA) 75 MG capsule   Take 1 capsule (75 mg total) by mouth daily. (Patient taking differently: Take 75 mg by mouth 3 (three) times daily. )   30 capsule   0    .   senna-docusate (SENOKOT-S) 8.6-50 MG tablet   Take 1 tablet by mouth daily at 12 noon.             .   tacrolimus (PROGRAF) 1 MG capsule   Take 2 capsules (2 mg total) by mouth 2 (two) times daily. Take 2 capsules twice daily. (Patient taking differently: Take 2 mg by mouth 2 (two) times daily. )   120 capsule   0    .   calcitRIOL (ROCALTROL) 0.5 MCG capsule   Take 1 capsule (0.5 mcg total) by mouth every Monday, Wednesday, and Friday with hemodialysis.   30 capsule   1    .   feeding supplement (BOOST / RESOURCE BREEZE) LIQD   Take 1 Container by mouth 3 (three) times daily between meals. (Patient not taking: Reported on 06/30/2016)   1 Container   100    .   HYDROmorphone (DILAUDID) 2 MG tablet   Take 1 tablet (2 mg total) by mouth at bedtime as needed for severe pain. (Patient not taking: Reported on 06/30/2016)   15 tablet   0    .   pantoprazole (PROTONIX) 40 MG tablet   Take 1 tablet (40 mg total) by mouth daily. (Patient not taking: Reported on 06/30/2016)   30 tablet   1    .   traMADol (ULTRAM) 50 MG tablet   Take 1 tablet (50 mg total) by mouth  every 6 (six) hours as needed for moderate pain. (Patient not taking: Reported on 06/30/2016)   60 tablet   0    .   warfarin (COUMADIN) 2.5 MG tablet  Take 1 tablet (2.5 mg total) by mouth daily at 6 PM. (Patient taking differently: Take 1.25-2.5 mg by mouth See admin instructions. Take 1 tablet (2.5 mg) by mouth on Tuesday, Thursday and Saturday at bedtime, take 1/2 tablet (1.25 mg) on Monday, Wednesday and Friday at bedtime)   30 tablet   0          Home:  Home Living  Family/patient expects to be discharged to:: Private residence  Living Arrangements: Children   Functional History:    Functional Status:   Mobility:             ADL:       Cognition:  Cognition  Orientation Level: Oriented to person, Oriented to place, Oriented to situation       Blood pressure (!) 111/59, pulse 84, temperature (P) 98.6 F (37 C), temperature source (P) Oral, resp. rate (!) 29, height 5\' 4"  (1.626 m), weight (P) 55.9 kg (123 lb 3.8 oz), SpO2 (P) 94 %.  Physical Exam   Vitals reviewed.  Constitutional: She appears well-developed and well-nourished. She appears distressed.   HENT:   Head: Normocephalic and atraumatic.   Eyes: EOM are normal. Right eye exhibits no discharge. Left eye exhibits no discharge.   Neck: Normal range of motion. Neck supple. No thyromegaly present.   Cardiovascular: Normal rate and regular rhythm.    Respiratory: Effort normal and breath sounds normal. No respiratory distress.  +[place]   GI: Soft. Bowel sounds are normal. She exhibits no distension.  Musculoskeletal: She exhibits edema and tenderness.  Neurological: She is alert.  Alert.  Makes eye contact with examiner.  Multiple complaints of pain.  Follows simple commands. Motor: (limited by pain): B/l UE 4+/5 proximal to distal LLE: HF 4-/5 RLE: HF 2/5   Skin:  Right BKA is dressed and appropriately tender. Recent left BKA still with some drainage at incision  site.  Psychiatric:  Assessment limited by pain         Lab Results Last 24 Hours  Imaging Results (Last 48 hours)          Assessment/Plan:  Diagnosis: Right BKA with history of left BKA  Labs independently reviewed.  Records reviewed and summated above.  Clean amputation daily with soap and water  Monitor incision site for signs of infection or impending skin breakdown.  Staples to remain in place for 3-4 weeks  Stump shrinker, for edema control   Scar mobilization massaging to prevent soft tissue  adherence Stump protector during therapies  Prevent flexion contractures by implementing the following:               Encourage prone lying for 20-30 mins per day BID to avoid hip flexion       Contractures if medically appropriate;              Avoid pillow under knees when patient is lying in bed in order to prevent both        knee and hip flexion contractures;              Avoid prolonged sitting  Post surgical pain control with oral medication  Phantom limb pain control with physical modalities including desensitization techniques (gentle self massage to the residual stump,hot packs if sensation intact, [place]) and mirror therapy, TENS. If ineffective, consider pharmacological treatment for neuropathic pain (e.g gabapentin, pregabalin, amytriptalyine, duloxetine).   When using wheelchair, patient should have knee on amputated side fully extended with board under the seat cushion.     1.Does the need for close, 24 hr/day medical supervision in concert with the patient's rehab needs make it unreasonable for this patient to be served in a less intensive setting? Yes   2.Co-Morbidities requiring supervision/potential complications: HTN (monitor and provide prns in accordance with increased physical exertion and pain), diabetes mellitus (Monitor in accordance with exercise and adjust meds as necessary), CAD, chronic back pain, end-stage renal disease with hemodialysis (recs per [place]), mechanical aortic valve (cont meds), history of liver transplant, left BKA, post-op pain (Biofeedback training with therapies to help reduce reliance on opiate pain medications, particularly IV morphine and IV dilaudid, monitor pain control during therapies, and sedation at rest and titrate to maximum efficacy to ensure participation and gains in therapies)   3.Due to safety, skin/wound care, disease management, pain management and patient education, does the patient require 24 hr/day rehab nursing?  Yes   4.Does the patient require coordinated care of a physician, rehab nurse, PT (1-2 hrs/day, 5 days/week) and OT (1-2 hrs/day, 5 days/week) to address physical and functional deficits in the context of the above medical diagnosis(es)? Yes Addressing deficits in the following areas: balance, endurance, locomotion, strength, transferring, bathing, dressing, toileting and psychosocial support   5.Can the patient actively participate in an intensive therapy program of at least 3 hrs of therapy per day at least 5 days per week? Potentially   6.The potential for patient to make measurable gains while on inpatient rehab is excellent   7.Anticipated functional outcomes upon discharge from inpatient rehab are TBD  with PT, TBD with OT, n/a with SLP.   8.Estimated rehab length of stay to reach the above functional goals is: TBD.   9.Anticipated D/C setting: Home   10.Anticipated post D/C treatments: HH therapy and Home excercise program   11.Overall Rehab/Functional Prognosis: good      RECOMMENDATIONS:  This patient's condition is appropriate for continued rehabilitative care in the following setting: Possibly CIR when patient able to tolerate 3 hours of therapy/day and if  anticipated to make functional progress.  Will await therapy evals as well.   Patient has agreed to participate in recommended program. Potentially  Note that insurance prior authorization may be required for reimbursement for recommended care.     Comment: Rehab Admissions Coordinator to follow up.     [name] Lesch, MD, Mellody Drown  Cathlyn Parsons., PA-C  07/09/2016         Revision History                                   Routing History

## 2016-07-10 NOTE — Progress Notes (Signed)
Dr. Naaman Plummer is in agreement to admit pt to CIR today. Daughter, [name] contacted and is in agreement. I will make the arrangement to admit today. [phone]

## 2016-07-10 NOTE — Care Management Important Message (Signed)
Important Message  Patient Details  Name: [name] MRN: [id number] Date of Birth: 08/20/1955   Medicare Important Message Given:  Yes    Kelvis Berger Montine Circle 07/10/2016, 10:52 AM

## 2016-07-10 NOTE — H&P (Signed)
Physical Medicine and Rehabilitation Admission H&P     Chief Complaint  Patient presents with  . Wound Check  :  HPI: [name] is a 61 y.o. 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 She was discharged to home with home health therapies. Per chart review patient lives with daughter. Essentially wheelchair bound prior to admission. She has yet to receive a prosthesis as prior BKA is healing. One level home with rehabilitation. Presented 06/30/2016 with gangrenous right heel and right great toe with findings of infrainguinal arterial occlusive disease. No change with conservative care and underwent right below knee amputation 07/08/2016 per Dr. [name]. Hospital course pain management.Teacher, music for fitting of Limb guard. Acute on chronic anemia 9.4 and monitor. Leukocytosis 20,600-23,000. Presently on intravenous heparin and chronic Coumadin resumed. Hemodialysis ongoing as per renal services. Therapy evaluations pending. M.D. has requested physical medicine rehabilitation consult.Patient was admitted for a comprehensive rehabilitation program  Review of Systems  Constitutional: Negative for chills and fever.  HENT: Negative for hearing loss.  Eyes: Negative for blurred vision and double vision.  Respiratory: Positive for cough and shortness of breath.  Cardiovascular: Positive for palpitations and leg swelling. Negative for chest pain.  Gastrointestinal: Positive for constipation. Negative for nausea.  GERD  Genitourinary: Negative for flank pain.  Musculoskeletal: Positive for back pain and myalgias.  Skin: Negative for rash.  Neurological: Positive for weakness and headaches. Negative for seizures.  Psychiatric/Behavioral:  Anxiety    All other systems reviewed and are negative.       Past Medical History:  Diagnosis Date  . Anemia    takes Folic Acid daily  . Anxiety   . Aortic stenosis   . Arthritis    "left hand, back" (08/30/2012)  . Asthma   . CAD (coronary artery disease)   . CAD (coronary artery disease) Jan. 2015   Cath: 20% LAD, 50% D1; s/p LIMA-LAD  . Chest pain 03/06/2015  . CHF (congestive heart failure) ([place]) 05/2016  . Chronic back pain   . Chronic bronchitis ([place])    "q yr w/season changes" (08/30/2012)  . Chronic constipation    takes MIralax and Colace daily  . COPD (chronic obstructive pulmonary disease) ([place])   . Depression    takes Cymbalta for "severe" depression  . End stage renal disease on dialysis [facility]) 02/27/2011   Kidneys shut down at time of liver transplant in Sept 2011 at [facility] in [place], she has been on HD ever since. Dialyzes at [facility] HD on TTS schedule. Had L forearm graft used 10 months then removed Dec 2012 due to suspected infection. A right upper arm AVG was placed Dec 2012 but she developed steal symptoms acutely and it was ligated the same day. Never had an AV fistula due to small veins. Now has L thigh AVG put in Jan 2013, has not clotted to date.   [name] GERD (gastroesophageal reflux disease)    takes Omeprazole daily  . Headache    "at least monthly" (08/30/2012)  . Hepatitis C   . History of blood transfusion    "several" (08/30/2012)  . Hypertension    takes Metoprolol and Lisinopril daily, sees Dr Bea Graff  . Hypothyroidism    takes Synthroid daily  . Migraine    "last migraine was in 2013" (08/30/2012)  .  Neuromuscular disorder ([place])    carpal tunnel in right hand  . Obesity   . Peripheral vascular disease (HCC) hands and legs  . Pneumonia    "today and several times before" (08/30/2012)  . 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 S/P unilateral BKA (below knee amputation), left ([place]) 04/30/2016  . SVT (supraventricular tachycardia) ([place]) 06/09/14  . Tobacco abuse   . Type II diabetes mellitus (HCC)    Levemir 2units daily if > 150        Past Surgical History:  Procedure Laterality Date  . ABDOMINAL AORTOGRAM W/LOWER EXTREMITY N/A 07/04/2016   Procedure: Abdominal Aortogram w/Lower Extremity; Surgeon: Elam Dutch, MD; Location: [place] [facility]; Service: Cardiovascular; Laterality: N/A;  . AMPUTATION Left 04/24/2016   Procedure: AMPUTATION BELOW KNEE; Surgeon: Angelia Mould, MD; Location: [place]; Service: Vascular; Laterality: Left;  . AMPUTATION Right 07/08/2016   Procedure: AMPUTATION BELOW KNEE; Surgeon: Angelia Mould, MD; Location: [place]; Service: Vascular; Laterality: Right;  . AORTIC VALVE REPLACEMENT N/A 03/15/2013   AVR; Surgeon: Ivin Poot, MD; Location: [facility] OR; Open Heart Surgery; 22mm[employer] mechanical prosthesis, top hat valve  . ARTERIOVENOUS GRAFT PLACEMENT Left 10/03/10   forearm  . AV FISTULA PLACEMENT  01/29/2011   Procedure: INSERTION OF ARTERIOVENOUS (AV) GORE-TEX GRAFT ARM; Surgeon: Elam Dutch, MD; Location: [facility] OR; Service: Vascular; Laterality: Right;  . AV FISTULA PLACEMENT  03/10/2011   Procedure: INSERTION OF ARTERIOVENOUS (AV) GORE-TEX GRAFT THIGH; Surgeon: Elam Dutch, MD; Location: [place]; Service: Vascular; Laterality: Left;  . [place] REMOVAL  12/23/2010   Procedure: REMOVAL OF ARTERIOVENOUS GORETEX GRAFT ([place]); Surgeon: Elam Dutch, MD; Location: [place]; Service: Vascular; Laterality: Left; procedure started @1736 -1852  . CHOLECYSTECTOMY  1993  . CORONARY ARTERY BYPASS GRAFT N/A 03/15/2013   Procedure: CORONARY ARTERY BYPASS GRAFTING (CABG) times one using left internal mammary artery.; Surgeon: Ivin Poot, MD; Location: [place]; Service: Open Heart Surgery; Laterality: N/A; POSS CABG X 1  . CYSTOSCOPY  1990's  . INSERTION OF DIALYSIS  CATHETER  12/23/2010   Procedure: INSERTION OF DIALYSIS CATHETER; Surgeon: Elam Dutch, MD; Location: [place]; Service: Vascular; Laterality: Right; Right Internal Jugular 28cm dialysis catheter insertion procedure time 1701-1720   . INSERTION OF DIALYSIS CATHETER Right 03/11/2016   Procedure: INSERTION OF DIALYSIS CATHETER; Surgeon: Angelia Mould, MD; Location: [place]; Service: Vascular; Laterality: Right;  . INTRAOPERATIVE TRANSESOPHAGEAL ECHOCARDIOGRAM N/A 03/15/2013   Procedure: INTRAOPERATIVE TRANSESOPHAGEAL ECHOCARDIOGRAM; Surgeon: Ivin Poot, MD; Location: [place]; Service: Open Heart Surgery; Laterality: N/A;  . LEFT HEART CATHETERIZATION WITH CORONARY ANGIOGRAM N/A 07/29/2012   Procedure: LEFT HEART CATHETERIZATION WITH CORONARY ANGIOGRAM; Surgeon: Troy Sine, MD; Location: [facility] CATH LAB; Service: Cardiovascular; Laterality: N/A;  . LEFT HEART CATHETERIZATION WITH CORONARY ANGIOGRAM N/A 03/10/2013   Procedure: LEFT HEART CATHETERIZATION WITH CORONARY ANGIOGRAM; Surgeon: Troy Sine, MD; Location: [facility] C[facility] LAB; Service: Cardiovascular; Laterality: N/A;  . LEFT HEART CATHETERIZATION WITH CORONARY/GRAFT ANGIOGRAM N/A 12/24/2011   Procedure: LEFT HEART CATHETERIZATION WITH Beatrix Fetters; Surgeon: [name], MD; Location: [facility] CATH LAB; Service: Cardiovascular; Laterality: N/A;  . LEFT HEART CATHETERIZATION WITH CORONARY/GRAFT ANGIOGRAM N/A 12/16/2013   Procedure: LEFT HEART CATHETERIZATION WITH Beatrix Fetters; Surgeon: Troy Sine, MD; Location: [facility] CATH LAB; Service: Cardiovascular; Laterality: N/A;  .  LIGATION ARTERIOVENOUS GORTEX GRAFT Left 03/11/2016   Procedure: LIGATION THIGH ARTERIOVENOUS GORTEX GRAFT; Surgeon: Angelia Mould, MD; Location: [place]; Service: Vascular; Laterality: Left;  . LIVER TRANSPLANT  10/25/2009   sees Dr Ferol Luz 1 every 6 months, saw last in Dec 2013. [name] Coord [phone]  . PERIPHERAL VASCULAR CATHETERIZATION  N/A 11/06/2015   Procedure: Abdominal Aortogram; Surgeon: Serafina Mitchell, MD; Location: [place] [facility]; Service: Cardiovascular; Laterality: N/A;  . PERIPHERAL VASCULAR CATHETERIZATION N/A 11/06/2015   Procedure: Lower Extremity Angiography; Surgeon: Serafina Mitchell, MD; Location: [place] [facility]; Service: Cardiovascular; Laterality: N/A;  . PERIPHERAL VASCULAR CATHETERIZATION  11/06/2015   Procedure: Peripheral Vascular Atherectomy; Surgeon: Serafina Mitchell, MD; Location: [place] [facility]; Service: Cardiovascular;; Left Superficial femoral  . SHUNTOGRAM Left 05/15/2014   Procedure: SHUNTOGRAM; Surgeon: Conrad Frankfort, MD; Location: [facility] CATH LAB; Service: Cardiovascular; Laterality: Left;  . SMALL INTESTINE SURGERY  90's  . SPINAL GROWTH RODS  2010   "put 2 metal rods in my back; they had detetriorated" (08/30/2012)  . THROMBECTOMY    . THROMBECTOMY AND REVISION OF ARTERIOVENTOUS (AV) GORETEX GRAFT Left 03/30/2014  . THROMBECTOMY AND REVISION OF ARTERIOVENTOUS (AV) GORETEX GRAFT Left 03/30/2014   Procedure: THROMBECTOMY AND REVISION OF ARTERIOVENTOUS (AV) GORETEX GRAFT; Surgeon: Conrad , MD; Location: [place]; Service: Vascular; Laterality: Left;  . TUBAL LIGATION  1990's        Family History  Problem Relation Age of Onset  . Cancer Mother   . Diabetes Mother   . Hypertension Mother   . Stroke Mother   . Cancer Father   . Anesthesia problems Neg Hx   . Hypotension Neg Hx   . Malignant hyperthermia Neg Hx   . Pseudochol deficiency Neg Hx    Social History: reports that she has been smoking Cigarettes. She has a 20.00 pack-year smoking history. She has never used smokeless tobacco. She reports that she does not drink alcohol or use drugs.  Allergies:       Allergies  Allergen Reactions  . Acetaminophen Other (See Comments)    Liver transplant recipient   . Codeine Itching  . Mirtazapine Other (See Comments)    hallucination         Medications Prior to Admission   Medication Sig Dispense Refill  . aspirin EC 81 MG tablet Take 81 mg by mouth daily at 12 noon.     . Calcium Carbonate Antacid (TUMS PO) Take 1 tablet by mouth daily with lunch.    . clindamycin (CLEOCIN) 150 MG capsule Take 150 mg by mouth 3 (three) times daily. 7 day course started 06/29/16 pm    . collagenase (SANTYL) ointment Apply 1 application topically daily.    [name] levothyroxine (SYNTHROID, LEVOTHROID) 175 MCG tablet Take 175 mcg by mouth daily at 12 noon.    . methocarbamol (ROBAXIN) 500 MG tablet Take 0.5 tablets (250 mg total) by mouth every 6 (six) hours as needed for muscle spasms. 30 tablet 0  . metoCLOPramide (REGLAN) 5 MG tablet Take 1 tablet (5 mg total) by mouth 2 (two) times daily before a meal. Decrease to once a day before breakfast after a couple of weeks. (Patient taking differently: Take 5 mg by mouth daily before lunch. ) 30 tablet 0  . metoprolol tartrate (LOPRESSOR) 25 MG tablet Take 1 tablet (25 mg total) by mouth 2 (two) times daily. (Patient taking differently: Take 25 mg by mouth See admin instructions. Take 1 tablet (25  mg) by mouth twice daily - 11am and 11pm) 60 tablet 0  . multivitamin (RENA-VIT) TABS tablet Take 1 tablet by mouth at bedtime. 30 tablet 0  . nitroGLYCERIN (NITROSTAT) 0.4 MG SL tablet Place 1 tablet (0.4 mg total) under the tongue every 5 (five) minutes as needed for chest pain. 30 tablet 0  . ondansetron (ZOFRAN ODT) 4 MG disintegrating tablet Take 1 tablet (4 mg total) by mouth every 8 (eight) hours as needed for nausea. 30 tablet 0  . oxyCODONE (OXY IR/ROXICODONE) 5 MG immediate release tablet Take 1 tablet (5 mg total) by mouth 2 (two) times daily as needed for severe pain or breakthrough pain. (Patient taking differently: Take 5 mg by mouth every 8 (eight) hours as needed for severe pain or breakthrough pain. ) 30 tablet 0  . polyethylene glycol (MIRALAX / GLYCOLAX) packet Take 17 g by mouth 2 (two) times daily. (Patient taking differently: Take 17  g by mouth 2 (two) times daily as needed (constipation). Mix in 8 oz liquid and drink) 60 each 0  . pregabalin (LYRICA) 75 MG capsule Take 1 capsule (75 mg total) by mouth daily. (Patient taking differently: Take 75 mg by mouth 3 (three) times daily. ) 30 capsule 0  . senna-docusate (SENOKOT-S) 8.6-50 MG tablet Take 1 tablet by mouth daily at 12 noon.     . tacrolimus (PROGRAF) 1 MG capsule Take 2 capsules (2 mg total) by mouth 2 (two) times daily. Take 2 capsules twice daily. (Patient taking differently: Take 2 mg by mouth 2 (two) times daily. ) 120 capsule 0  . calcitRIOL (ROCALTROL) 0.5 MCG capsule Take 1 capsule (0.5 mcg total) by mouth every Monday, Wednesday, and Friday with hemodialysis. 30 capsule 1  . feeding supplement (BOOST / RESOURCE BREEZE) LIQD Take 1 Container by mouth 3 (three) times daily between meals. (Patient not taking: Reported on 06/30/2016) 1 Container 100  . HYDROmorphone (DILAUDID) 2 MG tablet Take 1 tablet (2 mg total) by mouth at bedtime as needed for severe pain. (Patient not taking: Reported on 06/30/2016) 15 tablet 0  . pantoprazole (PROTONIX) 40 MG tablet Take 1 tablet (40 mg total) by mouth daily. (Patient not taking: Reported on 06/30/2016) 30 tablet 1  . traMADol (ULTRAM) 50 MG tablet Take 1 tablet (50 mg total) by mouth every 6 (six) hours as needed for moderate pain. (Patient not taking: Reported on 06/30/2016) 60 tablet 0  . warfarin (COUMADIN) 2.5 MG tablet Take 1 tablet (2.5 mg total) by mouth daily at 6 PM. (Patient taking differently: Take 1.25-2.5 mg by mouth See admin instructions. Take 1 tablet (2.5 mg) by mouth on Tuesday, Thursday and Saturday at bedtime, take 1/2 tablet (1.25 mg) on Monday, Wednesday and Friday at bedtime) 30 tablet 0   Home:  Home Living  Family/patient expects to be discharged to:: Inpatient rehab  Living Arrangements: Children  Available Help at Discharge: Family, Available PRN/intermittently  Type of Home: Mobile home  Home Access:  Ramped entrance  Home Layout: One level  Bathroom Shower/Tub: Administrator, [employer] Service: Handicapped height  Bathroom Accessibility: Yes  Home Equipment: Bedside commode, Environmental consultant - 2 wheels, Wheelchair - manual  Additional Comments: A ramp has been built for her to enter home  Functional History:  Prior Function  Level of Independence: Needs assistance  Gait / Transfers Assistance Needed: w/c for mobility. daughter assisting with pivoting to w/c and toilet  ADL's / Homemaking Assistance Needed: daughter assisting with ADL. only sponge bathing  at this time  Functional Status:  Mobility:  Bed Mobility  Overal bed mobility: Needs Assistance  Bed Mobility: Supine to Sit, Sit to Supine  Supine to sit: Max assist  Sit to supine: Mod assist, HOB elevated  General bed mobility comments: Assist bring hips around to EOB and for trunk elevation to sitting. Pt using bed rail to assist with pulling self up into sitting  Transfers  Overall transfer level: Needs assistance  Transfers: Therapist, art transfers: Max assist, Total assist, +2 physical assistance  General transfer comment: Max assist +2 for transfer to [facility], total assist +2 for return to bed due to fatigue. Cues throughout for weight shift and technique    ADL:  ADL  Overall ADL's : Needs assistance/impaired  Eating/Feeding: Set up, Bed level  Grooming: Min guard, Sitting  Upper Body Bathing: Minimal assistance, Sitting  Lower Body Bathing: Total assistance, Bed level  Upper Body Dressing : Minimal assistance, Sitting  Lower Body Dressing: Total assistance, Bed level  Toilet Transfer: Maximal assistance, Total assistance, +2 for physical assistance, Anterior/posterior, BSC  Toilet Transfer Details (indicate cue type and reason): Max assist to [facility], total assist for return to bed  Functional mobility during ADLs: Maximal assistance, Total assistance, +2 for physical assistance (for AP transfer)   Cognition:  Cognition  Overall Cognitive Status: Within Functional Limits for tasks assessed  Orientation Level: Oriented X4  Cognition  Arousal/Alertness: Awake/alert  Behavior During Therapy: WFL for tasks assessed/performed  Overall Cognitive Status: Within Functional Limits for tasks assessed  General Comments: Pt was in a lot of pain from R BKA and a "sore on her bottom"  Physical Exam:  Blood pressure (!) 114/54, pulse 78, temperature 98.4 F (36.9 C), temperature source Oral, resp. rate 18, height 5\' 4"  (1.626 m), weight 53.5 kg (117 lb 15.1 oz), SpO2 91 %.  Physical Exam  Vitals reviewed.  Constitutional:  61 year old right handed frail female  HENT:  Head: Normocephalic.  Right Ear: External ear normal.  Left Ear: External ear normal.  Eyes: EOM are normal. Pupils are equal, round, and reactive to light.  Neck: Normal range of motion. Neck supple. No tracheal deviation present. No thyromegaly present.  Cardiovascular: Normal rate, regular rhythm and normal heart sounds.  Respiratory: Effort normal and breath sounds normal. No respiratory distress. She has no wheezes. She has no rales.  GI: Soft. Bowel sounds are normal. She exhibits no distension.  Skin: Skin is warm.  Skin. Right BKA is dressed and appropriately tender, well-approximated. Recent left BKA still a small amount of drainage at incision line along lateral aspect---two small openings only  Neurological: .  A little drowsy from just having received pain medication. Oriented to place/person/date. Motor: (limited by pain): BL UE 4+/5 proximal to distal LLE: HF 4-/5 without inhibition RLE: HF 2/5 and limited by pain  Psych: pleasant and appropriate.  Lab Results Last 48 Hours  Imaging Results (Last 48 hours)     Medical Problem List and Plan:  1. 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  -admit to inpatient rehab  -needs KI for right leg, apparently biotech consulted on acute  - Awaiting plan for left prosthesis as stump heals  2. DVT Prophylaxis/Anticoagulation: Chronic Coumadin therapy for history of mechanical aortic valve  3. Pain Management: Lyrica 75 mg daily, oxycodone and Robaxin as needed  -limit as possible given obvious sedation4. Mood: Provide emotional support  5. Neuropsych: This patient is capable of making decisions on her own behalf.  6. Skin/Wound Care: Routine skin checks  7. Fluids/Electrolytes/Nutrition: Routine I&O with follow-up chemistries  8. ESRD. Continue dialysis as directed. HD to occur at the end of the day to allow for participation in rehab activities  9. Leukocytosis. Follow-up labs  10. Acute on chronic anemia. Continue Aranesp. Follow-up  CBC  11. Hypertension. Lopressor 25 mg twice a day. Monitor with increased mobility  12. CAD/AVR. Continue chronic Coumadin  13. History of liver transplant. Continue Prograf  14. Diabetes mellitus of peripheral neuropathy. Hemoglobin A1c 5.7.Bouts of hypoglycemia with sliding scale discontinued  15. Hypothyroidism. Synthroid  16. Constipation. Laxative assistance    Post Admission Physician Evaluation:  1. Functional deficits secondary to new right BKA and recent left BKA. 2. Patient is admitted to receive collaborative, interdisciplinary care between the physiatrist, rehab nursing staff, and therapy team. 3. Patient's level of medical complexity and substantial therapy needs in context of that medical necessity cannot be provided at a lesser intensity of care such as a SNF. 4. Patient has experienced substantial functional loss from his/her baseline which was documented above under the "Functional History" and "Functional Status" headings. Judging by the patient's diagnosis, physical exam, and functional history, the patient has potential for functional progress which will result in measurable gains while on inpatient rehab. These gains will be of substantial and practical use upon discharge in facilitating mobility and self-care at the household level. 5. Physiatrist will provide 24 hour management of medical needs as well as oversight of the therapy plan/treatment and provide guidance as appropriate regarding the interaction of the two. 6. The Preadmission Screening has been reviewed and patient status is unchanged unless otherwise stated above. 7. 24 hour rehab nursing will assist with bladder management, bowel management, safety, skin/wound care, disease management, medication administration, pain management and patient education and help integrate therapy concepts, techniques,education, etc. 8. PT will assess and treat for/with: Lower extremity strength, range of motion, stamina, balance,  functional mobility, safety, adaptive techniques and equipment, pre-prosthetic education/stump mgt, pain control. Goals are: supervision to min assist at w/c level. 9. OT will assess and treat for/with: ADL's, functional mobility, safety, upper extremity strength, adaptive techniques and equipment, pain mgt, family edu. Goals are: supervision to min assist. Therapy may not yet proceed with showering this patient. 10. SLP will assess and treat for/with: n/a. Goals are: n/a. 11. Case Management and Social Worker will assess and treat for psychological issues and discharge planning. 12. Team conference will be held weekly to assess progress toward goals and to determine barriers to discharge. 13. Patient will receive at least 3 hours of therapy per day at least 5 days per week. 14. ELOS: 10-14 days  15. Prognosis: excellent [name], MD, [place] [facility]  07/10/2016  Cathlyn Parsons., PA-C  07/10/2016

## 2016-07-10 NOTE — Progress Notes (Signed)
Physical Therapy Treatment Patient Details Name: [name] MRN: [id number] DOB: [birth year] Today's Date: 07/10/2016    History of Present Illness [name] Mcquaig [name] a 61 y.o.femalewith medical history significant for hypertension, diabetes, coronary artery disease, chronic back pain, depression, end-stage renal disease on HD, mechanical aortic valve on warfarin, and history of liver transplantation, H/o PAD s/p L BKA in 3/18who presents to the emergency department for evaluation of wounds on the right foot and left BKA stump; now s/p R BKA    PT Comments    Pt is able to tolerate a lateral scooting transfer this session with decreased c/o pain. Pt does not assist much with transfer and is almost totally transferred by clinicians. Attempted some knee extension exercises on RLE in order to assist with restoring full ROM.     Follow Up Recommendations  CIR     Equipment Recommendations  Other (comment) (defer to next venue)    Recommendations for Other Services       Precautions / Restrictions Precautions Precautions: Fall Restrictions Weight Bearing Restrictions: Yes RLE Weight Bearing: Non weight bearing LLE Weight Bearing: Non weight bearing    Mobility  Bed Mobility Overal bed mobility: Needs Assistance Bed Mobility: Supine to Sit     Supine to sit: Max assist     General bed mobility comments: Max A to sit upright in bed  Transfers Overall transfer level: Needs assistance Equipment used: 2 person hand held assist Transfers: Lateral/Scoot Transfers          Lateral/Scoot Transfers: Max assist;+2 physical assistance General transfer comment: Max A lateral slide transfer from bed to recliner. Pt is more concerned about the pain and protecting her RLE than assistance with transfers.   Ambulation/Gait                 Stairs            Wheelchair Mobility    Modified Rankin (Stroke Patients Only)       Balance Overall balance  assessment: Needs assistance Sitting-balance support: Bilateral upper extremity supported Sitting balance-Leahy Scale: Fair                                      Cognition Arousal/Alertness: Awake/alert Behavior During Therapy: WFL for tasks assessed/performed Overall Cognitive Status: Within Functional Limits for tasks assessed                                        Exercises      General Comments        Pertinent Vitals/Pain Pain Assessment: Faces Faces Pain Scale: Hurts whole lot Pain Location: RLE Pain Descriptors / Indicators: Aching;Sore Pain Intervention(s): Monitored during session;Repositioned    Home Living                 Additional Comments: A ramp has been built for her to enter home    Prior Function            PT Goals (current goals can now be found in the care plan section) Acute Rehab PT Goals Patient Stated Goal: to decrease pain Progress towards PT goals: Progressing toward goals    Frequency    Min 3X/week      PT Plan Current plan remains appropriate    Co-evaluation  AM-PAC PT "6 Clicks" Daily Activity  Outcome Measure  Difficulty turning over in bed (including adjusting bedclothes, sheets and blankets)?: Total Difficulty moving from lying on back to sitting on the side of the bed? : Total Difficulty sitting down on and standing up from a chair with arms (e.g., wheelchair, bedside commode, etc,.)?: Total Help needed moving to and from a bed to chair (including a wheelchair)?: Total Help needed walking in hospital room?: Total Help needed climbing 3-5 steps with a railing? : Total 6 Click Score: 6    End of Session   Activity Tolerance: Patient limited by pain Patient left: in chair;with call bell/phone within reach;with chair alarm set Nurse Communication: Mobility status PT Visit Diagnosis: Other abnormalities of gait and mobility (R26.89);Muscle weakness (generalized)  (M62.81);Pain Pain - Right/Left: Right Pain - part of body: Leg     Time: 3846-6599 PT Time Calculation (min) (ACUTE ONLY): 9 min  Charges:  $Therapeutic Activity: 8-22 mins                    G Codes:       Scheryl Marten PT, DPT  [phone]    Shanon Rosser 07/10/2016, 1:17 PM

## 2016-07-10 NOTE — Progress Notes (Signed)
ANTICOAGULATION CONSULT NOTE - Follow-Up  Pharmacy Consult for Heparin + Warfarin Indication: mechanical AVR   Allergies  Allergen Reactions  . Acetaminophen Other (See Comments)    Liver transplant recipient   . Codeine Itching  . Mirtazapine Other (See Comments)    hallucination    Patient Measurements: Height: 5\' 4"  (162.6 cm) Weight: 117 lb 15.1 oz (53.5 kg) IBW/kg (Calculated) : 54.7 Heparin Dosing Weight: 52.6 kg  Vital Signs: Temp: 98.3 F (36.8 C) (05/31 1005) Temp Source: Oral (05/31 1005) BP: 120/48 (05/31 1005) Pulse Rate: 79 (05/31 1005)  Labs:  Recent Labs  07/08/16 0739 07/09/16 0338 07/09/16 0938 07/09/16 1815 07/10/16 0629  HGB 10.3* 9.2* 8.4*  --  9.4*  HCT 29.6* 26.2* 24.1*  --  26.9*  PLT 140* 154 135*  --  137*  LABPROT 18.3* 19.3*  --   --  20.9*  INR 1.50 1.60  --   --  1.78  HEPARINUNFRC 0.27* 0.20*  --  0.49 0.40  CREATININE 4.01*  --  5.28*  --  3.39*    Estimated Creatinine Clearance: 14.7 mL/min (A) (by C-G formula based on SCr of 3.39 mg/dL (H)).   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 Heparin resumed post-op on 5/29 and Warfarin resumed 5/30. PTA warfarin dose was 1.25 mg daily EXCEPT for 2.5 mg on Tues/Thurs/Sun.   Heparin level this morning is therapeutic (HL 0.4 << 0.49, goal of 0.3-0.7). Post-op ABLA noted however CBC now stable. No overt bleeding noted.   INR today remains SUBtherapeutic (INR 1.78 << 1.6, goal of 2.5-3.5). The patient received Vit K 2 mg + FFP on 5/25. Will give 1.5x home dose.  Goal of Therapy:  Heparin level 0.3-0.7 units/ml INR 2.5-3.5 Monitor platelets by anticoagulation protocol: Yes   Plan:  1. Continue Heparin at 1050 units/hr (10.5 ml/hr) until the afternoon HL is drawn and accessed 2. Warfarin 3 mg x 1 dose at 1800 today 3. Daily PT/INR, HL, CBC q72h 4. Will continue to monitor for any signs/symptoms of bleeding and will follow up with  heparin level and PT/INR in the AM  Thank you for allowing pharmacy to be a part of this patient's care.  Alycia Rossetti, PharmD, BCPS Clinical Pharmacist Pager: [phone] Clinical phone for 07/10/2016 from 7a-3:30p: [phone] If after 3:30p, please call main pharmacy at: [phone] 07/10/2016 10:13 AM

## 2016-07-10 NOTE — Progress Notes (Signed)
ANTICOAGULATION CONSULT NOTE - Follow-Up  Pharmacy Consult for Heparin + Warfarin Indication: mechanical AVR   Allergies  Allergen Reactions  . Acetaminophen Other (See Comments)    Liver transplant recipient   . Codeine Itching  . Mirtazapine Other (See Comments)    hallucination    Patient Measurements: Height: 5\' 4"  (162.6 cm) IBW/kg (Calculated) : 54.7 Heparin Dosing Weight: 52.6 kg  Vital Signs: Temp: 98.4 F (36.9 C) (05/31 1708) Temp Source: Oral (05/31 1708) BP: 116/59 (05/31 1708) Pulse Rate: 77 (05/31 1708)  Labs:  Recent Labs  07/08/16 0739 07/09/16 0338 07/09/16 0938 07/09/16 1815 07/10/16 0629 07/10/16 1946  HGB 10.3* 9.2* 8.4*  --  9.4*  --   HCT 29.6* 26.2* 24.1*  --  26.9*  --   PLT 140* 154 135*  --  137*  --   LABPROT 18.3* 19.3*  --   --  20.9*  --   INR 1.50 1.60  --   --  1.78  --   HEPARINUNFRC 0.27* 0.20*  --  0.49 0.40 0.28*  CREATININE 4.01*  --  5.28*  --  3.39*  --     Estimated Creatinine Clearance: 14.7 mL/min (A) (by C-G formula based on SCr of 3.39 mg/dL (H)).   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 Heparin resumed post-op on 5/29 and Warfarin resumed 5/30. PTA warfarin dose was 1.25 mg daily EXCEPT for 2.5 mg on Tues/Thurs/Sun.   Heparin level this evening is subtherapeutic at 0.28 on heparin 1050 units/hr. Nurse reports no issues with infusion or bleeding.  Goal of Therapy:  Heparin level 0.3-0.7 units/ml INR 2.5-3.5 Monitor platelets by anticoagulation protocol: Yes   Plan:  Increase heparin to 1150 units/hr Warfarin 3 mg tonight x1 Daily PT/INR, HL, CBC q72h  Andrey Cota. Diona Foley, PharmD, BCPS Clinical Pharmacist [phone] 07/10/2016 9:04 PM

## 2016-07-10 NOTE — Progress Notes (Signed)
[facility] Progress Note   Subjective:    Afebrile overnight. Feeling better today. Pain controlled  Appetite returning Plan for CIR    Objective Vitals:   07/09/16 1831 07/09/16 2150 07/10/16 0559 07/10/16 1005  BP: 117/61 (!) 117/52 (!) 114/54 (!) 120/48  Pulse: 85 87 78 79  Resp: 18 19 18 18   Temp: 98.5 F (36.9 C) 98.9 F (37.2 C) 98.4 F (36.9 C) 98.3 F (36.8 C)  TempSrc: Oral Oral Oral Oral  SpO2: 96% 96% 91% 94%  Weight:      Height:       Physical Exam General: Sitting up in bed NAD Heart: RRR Lungs: no rales Abdomen: soft NT. Scars from abd surgeries/liver xpl Extremities: R BKA on pillow wrapped/ L BKA dressing on poorly healing stump;  Dialysis Access: R IJ TTDC with dsg intact    Recent Labs Lab 07/07/16 0708 07/08/16 0739 07/09/16 0938 07/10/16 0629  NA 133* 130* 130* 130*  K 3.5 4.4 4.2 3.9  CL 97* 98* 97* 96*  CO2 24 23 24 23   GLUCOSE 127* 104* 151* 166*  BUN 15 7 12 6   CREATININE 6.43* 4.01* 5.28* 3.39*  CALCIUM 9.0 9.1 9.0 8.9  PHOS 5.8*  --  5.1* 4.3     Recent Labs Lab 07/07/16 0708 07/09/16 0938 07/10/16 0629  ALBUMIN 1.7* 1.8* 1.8*     Recent Labs Lab 07/07/16 0709 07/08/16 0739 07/09/16 0338 07/09/16 0938 07/10/16 0629  WBC 12.6* 16.5* 16.1* 20.6* 23.5*  HGB 8.9* 10.3* 9.2* 8.4* 9.4*  HCT 25.2* 29.6* 26.2* 24.1* 26.9*  MCV 82.6 82.2 83.2 82.0 83.0  PLT 130* 140* 154 135* 137*     Blood Culture    Component Value Date/Time   SDES BLOOD RIGHT HAND 06/30/2016 2247   SPECREQUEST  06/30/2016 2247    BOTTLES DRAWN AEROBIC AND ANAEROBIC Blood Culture adequate volume   CULT NO GROWTH 5 DAYS 06/30/2016 2247   REPTSTATUS 07/06/2016 FINAL 06/30/2016 2247     Recent Labs Lab 07/09/16 1731 07/09/16 2014 07/09/16 2234 07/10/16 0604 07/10/16 0759  GLUCAP 162* 240* 234* 174* 140*    Lab Results  Component Value Date   INR 1.78 07/10/2016   INR 1.60 07/09/2016   INR 1.50 07/08/2016    Medications: . sodium chloride Stopped (07/08/16 1359)  . cefTRIAXone (ROCEPHIN)  IV Stopped (07/10/16 4098)  . ferric gluconate (FERRLECIT/NULECIT) IV Stopped (07/05/16 1214)  . heparin 1,050 Units/hr (07/09/16 1037)   . aspirin EC  81 mg Oral Daily  . calcitRIOL  0.5 mcg Oral Q M,W,F-HD  . calcium acetate  667 mg Oral TID WC  . Chlorhexidine Gluconate Cloth  6 each Topical Daily  . darbepoetin (ARANESP) injection - DIALYSIS  60 mcg Intravenous Q Mon-HD  . docusate sodium  100 mg Oral Daily  . feeding supplement  1 Container Oral TID BM  . levothyroxine  175 mcg Oral QAC breakfast  . metoCLOPramide  5 mg Oral BID AC  . metoprolol tartrate  25 mg Oral BID  . multivitamin  1 tablet Oral QHS  . mupirocin ointment  1 application Nasal BID  . polyethylene glycol  17 g Oral BID  . pregabalin  75 mg Oral Daily  . senna-docusate  1 tablet Oral BID  . tacrolimus  2 mg Oral BID  . warfarin  3 mg Oral ONCE-1800  . Warfarin - Pharmacist Dosing Inpatient   Does not apply q1800    Dialysis Orders:  [place]  MWF - right IJ 3.5h 180F  BFR 450/A1.5x  2K/2Ca  Na Linear  EDW 50 kg No Heparin -Calcitriol 0.5 mcg PO q HD -Mircera 50 mcg IV q 4 weeks   Background: 61 y.o.femalewith ESRD on HD, HTN, DM, CAD, chronic back pain, 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 Scot Dock. 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 Arteriogram no surgically correctable dx.     Assessment/Recommendations 1. PAD/R foot gangrene/recent L BKA (not healed) - s/p R BKA 5/29  per primary - plan CIR admission  2. ESRD - MWF  Using 3K bath  3. Anemia - Hgb 9.4 on  start weekly Fe ; redose Aranesp 60 5/27 - hgb will drop with surgery 4. Metabolic bone disease - Cont VDRA/Ca acetate binder  5. Severe protein malnutrition - Albumin 1.8 renal diet/vitamins/prostat/Boost - on regular diet to promote intake-  intake poor 6. S/p mechanical AVR -  anticoagulation per pharmacy  7. H/o liver transplant - on Prograf 8. DM - per primary  9.   BP/volume - BP ok / still above EDW by bed weights post HD wt 53.5kg anticipate lower EDW s/p BKA   [name] PA-C [facility] Pager [phone] 07/10/2016,11:29 AM   Renal Attending; I agree with note as articulated above.  She is appropriate for inpt rehab. Vicky Mccanless C  Suzie Vandam C

## 2016-07-10 NOTE — Progress Notes (Addendum)
  Progress Note VASCULAR SURGERY ASSESSMENT & PLAN:    Dressing changed today on right BKA. Incision looks fine.   She is having a hard time keeping her knee straight. I have contacted [employer] and asked them to fit her for a limb guard.   On heparin until Coumadin therapeutic.   PTx/ CIR  [name], MD, FACS Beeper [phone] Office: [phone]    2 Days Post-Op  Subjective:  No complaints  afebrile Vitals:   07/09/16 2150 07/10/16 0559  BP: (!) 117/52 (!) 114/54  Pulse: 87 78  Resp: 19 18  Temp: 98.9 F (37.2 C) 98.4 F (36.9 C)    Physical Exam: Extremities:  Bandage in place on right BKA stump (just changed by Dr. Scot Dock per pt)  CBC    Component Value Date/Time   WBC 23.5 (H) 07/10/2016 0629   RBC 3.24 (L) 07/10/2016 0629   HGB 9.4 (L) 07/10/2016 0629   HCT 26.9 (L) 07/10/2016 0629   PLT 137 (L) 07/10/2016 0629   MCV 83.0 07/10/2016 0629   MCH 29.0 07/10/2016 0629   MCHC 34.9 07/10/2016 0629   RDW 19.1 (H) 07/10/2016 0629   LYMPHSABS 2.4 07/01/2016 0806   MONOABS 1.1 (H) 07/01/2016 0806   EOSABS 1.3 (H) 07/01/2016 0806   BASOSABS 0.0 07/01/2016 0806    BMET    Component Value Date/Time   NA 130 (L) 07/10/2016 0629   K 3.9 07/10/2016 0629   CL 96 (L) 07/10/2016 0629   CO2 23 07/10/2016 0629   GLUCOSE 166 (H) 07/10/2016 0629   BUN 6 07/10/2016 0629   CREATININE 3.39 (H) 07/10/2016 0629   CREATININE 6.42 (H) 01/17/2016 1233   CALCIUM 8.9 07/10/2016 0629   GFRNONAA 14 (L) 07/10/2016 0629   GFRAA 16 (L) 07/10/2016 0629    INR    Component Value Date/Time   INR 1.78 07/10/2016 0629   INR 4.0 03/31/2016     Intake/Output Summary (Last 24 hours) at 07/10/16 0835 Last data filed at 07/09/16 2200  Gross per 24 hour  Intake           556.45 ml  Output             2550 ml  Net         -1993.55 ml     Assessment/Plan:  61 y.o. female is s/p right below knee amputation  2 Days Post-Op  -dressing not removed as pt stated Dr.  Scot Dock had just changed it and said the incision looks good. -hopefully pt can go to CIR -heparin/coumadin bridge for mechanical mitral valve per primary team.  INR today is 1.78 -leukocytosis-pt is afebrile.  Check labs tomorrow.  Continue to monitor.   Leontine Locket, PA-C [facility] [phone] 07/10/2016 8:35 AM

## 2016-07-10 NOTE — H&P (Signed)
Physical Medicine and Rehabilitation Admission H&P    Chief Complaint  Patient presents with  . Wound Check  : HPI: [name] is a 61 y.o. 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 She was discharged to home with home health therapies. Per chart review patient lives with daughter. Essentially wheelchair bound prior to admission. She has yet to receive a prosthesis as prior BKA is healing. One level home with rehabilitation. Presented 06/30/2016 with gangrenous right heel and right great toe with findings of infrainguinal arterial occlusive disease. No change with conservative care and underwent right below knee amputation 07/08/2016 per Dr. [name]. Hospital course pain management.Teacher, music for fitting of Limb guard. Acute on chronic anemia 9.4 and monitor. Leukocytosis 20,600-23,000. Presently on intravenous heparin and chronic Coumadin resumed. Hemodialysis ongoing as per renal services. Therapy evaluations pending. M.D. has requested physical medicine rehabilitation consult.Patient was admitted for a comprehensive rehabilitation program  Review of Systems  Constitutional: Negative for chills and fever.  HENT: Negative for hearing loss.   Eyes: Negative for blurred vision and double vision.  Respiratory: Positive for cough and shortness of breath.   Cardiovascular: Positive for palpitations and leg swelling. Negative for chest pain.  Gastrointestinal: Positive for constipation. Negative for nausea.       GERD  Genitourinary: Negative for flank pain.  Musculoskeletal: Positive for back pain and myalgias.  Skin: Negative for rash.  Neurological: Positive for weakness and headaches. Negative for seizures.  Psychiatric/Behavioral:      Anxiety  All other systems reviewed and are negative.  Past Medical History:  Diagnosis Date  . Anemia    takes Folic Acid daily  . Anxiety   . Aortic stenosis   . Arthritis    "left hand, back" (08/30/2012)  . Asthma   . CAD (coronary artery disease)   . CAD (coronary artery disease) Jan. 2015   Cath: 20% LAD, 50% D1; s/p LIMA-LAD  . Chest pain 03/06/2015  . CHF (congestive heart failure) ([place]) 05/2016  . Chronic back pain   . Chronic bronchitis ([place])    "q yr w/season changes" (08/30/2012)  . Chronic constipation    takes MIralax and Colace daily  . COPD (chronic obstructive pulmonary disease) ([place])   . Depression    takes Cymbalta for "severe" depression  . End stage renal disease on dialysis [facility]) 02/27/2011   Kidneys shut down at time of liver transplant in Sept 2011 at [facility] in [place], she has been on HD ever since.  Dialyzes at [facility] HD on TTS schedule.  Had L forearm graft used 10 months then removed Dec 2012 due to suspected infection.  A right upper arm AVG was placed Dec 2012 but she developed steal symptoms acutely and it was ligated the same day.  Never had an AV fistula due to small veins.  Now has L thigh AVG put in Jan 2013, has not clotted to date.    [name] GERD (gastroesophageal reflux disease)    takes Omeprazole daily  . Headache    "at least monthly" (08/30/2012)  . Hepatitis C   . History of blood transfusion    "several" (08/30/2012)  . Hypertension    takes Metoprolol and Lisinopril daily, sees Dr Bea Graff  . Hypothyroidism    takes Synthroid daily  . Migraine    "  last migraine was in 2013" (08/30/2012)  . Neuromuscular disorder ([place])    carpal tunnel in right hand  . Obesity   . Peripheral vascular disease (HCC) hands and legs  . Pneumonia    "today and several times before" (08/30/2012)  . 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 S/P unilateral BKA (below knee amputation), left ([place]) 04/30/2016  . SVT (supraventricular tachycardia) ([place]) 06/09/14  . Tobacco abuse   . Type II diabetes mellitus (HCC)    Levemir 2units daily if > 150   Past Surgical History:  Procedure Laterality Date  . ABDOMINAL AORTOGRAM W/LOWER EXTREMITY N/A 07/04/2016   Procedure: Abdominal Aortogram w/Lower Extremity;  Surgeon: Elam Dutch, MD;  Location: [place] CV LAB;  Service: Cardiovascular;  Laterality: N/A;  . AMPUTATION Left 04/24/2016   Procedure: AMPUTATION BELOW KNEE;  Surgeon: Angelia Mould, MD;  Location: [place];  Service: Vascular;  Laterality: Left;  . AMPUTATION Right 07/08/2016   Procedure: AMPUTATION BELOW KNEE;  Surgeon: Angelia Mould, MD;  Location: [place];  Service: Vascular;  Laterality: Right;  . AORTIC VALVE REPLACEMENT N/A 03/15/2013   AVR; Surgeon: Ivin Poot, MD;  Location: [facility] OR; Open Heart Surgery;  72m[employer] mechanical prosthesis, top hat valve  . ARTERIOVENOUS GRAFT PLACEMENT Left 10/03/10    forearm  . AV FISTULA PLACEMENT  01/29/2011   Procedure: INSERTION OF ARTERIOVENOUS (AV) GORE-TEX GRAFT ARM;  Surgeon: CElam Dutch MD;  [facility];  Service: Vascular;  Laterality: Right;  . AV FISTULA PLACEMENT  03/10/2011   Procedure: INSERTION OF ARTERIOVENOUS (AV) GORE-TEX GRAFT THIGH;  Surgeon: CElam Dutch MD;  [place]  Service: Vascular;  Laterality: Left;  . [place]  12/23/2010   Procedure: REMOVAL OF ARTERIOVENOUS GORETEX GRAFT (ABrookings;  Surgeon: CElam Dutch MD;  [place]  Service: Vascular;  Laterality: Left;  procedure started _0 -1852  . CHOLECYSTECTOMY  1993  . CORONARY ARTERY BYPASS GRAFT N/A 03/15/2013   Procedure: CORONARY ARTERY BYPASS GRAFTING (CABG) times one using left internal mammary artery.;  Surgeon: PIvin Poot MD;  [place]  Service: Open Heart Surgery;  Laterality: N/A;  POSS CABG X 1  .  CYSTOSCOPY  1990's  . INSERTION OF DIALYSIS CATHETER  12/23/2010   Procedure: INSERTION OF DIALYSIS CATHETER;  Surgeon: CElam Dutch MD;  [place]  Service: Vascular;  Laterality: Right;  Right Internal Jugular 28cm dialysis catheter insertion procedure time 1701-1720   . INSERTION OF DIALYSIS CATHETER Right 03/11/2016   Procedure: INSERTION OF DIALYSIS CATHETER;  Surgeon: CAngelia Mould MD;  [place]  Service: Vascular;  Laterality: Right;  . INTRAOPERATIVE TRANSESOPHAGEAL ECHOCARDIOGRAM N/A 03/15/2013   Procedure: INTRAOPERATIVE TRANSESOPHAGEAL ECHOCARDIOGRAM;  Surgeon: PIvin Poot MD;  [place]  Service: Open Heart Surgery;  Laterality: N/A;  . LEFT HEART CATHETERIZATION WITH CORONARY ANGIOGRAM N/A 07/29/2012   Procedure: LEFT HEART CATHETERIZATION WITH CORONARY ANGIOGRAM;  Surgeon: TTroy Sine MD;  [facility];  Service: Cardiovascular;  Laterality: N/A;  . LEFT HEART CATHETERIZATION WITH CORONARY ANGIOGRAM N/A 03/10/2013   Procedure: LEFT HEART CATHETERIZATION WITH CORONARY ANGIOGRAM;  Surgeon: TTroy Sine MD;  [facility];  Service: Cardiovascular;  Laterality: N/A;  . LEFT HEART CATHETERIZATION WITH CORONARY/GRAFT  ANGIOGRAM N/A 12/24/2011   Procedure: LEFT HEART CATHETERIZATION WITH Beatrix Fetters;  Surgeon: [name], MD;  Location: [facility] CATH LAB;  Service: Cardiovascular;  Laterality: N/A;  . LEFT HEART CATHETERIZATION WITH CORONARY/GRAFT ANGIOGRAM N/A 12/16/2013   Procedure: LEFT HEART CATHETERIZATION WITH Beatrix Fetters;  Surgeon: Troy Sine, MD;  Location: [facility] CATH LAB;  Service: Cardiovascular;  Laterality: N/A;  . LIGATION ARTERIOVENOUS GORTEX GRAFT Left 03/11/2016   Procedure: LIGATION THIGH ARTERIOVENOUS GORTEX GRAFT;  Surgeon: Angelia Mould, MD;  Location: [place];  Service: Vascular;  Laterality: Left;  . LIVER TRANSPLANT  10/25/2009   sees Dr Ferol Luz 1 every 6 months, saw last in Dec  2013. [name] Coord [phone]  . PERIPHERAL VASCULAR CATHETERIZATION N/A 11/06/2015   Procedure: Abdominal Aortogram;  Surgeon: Serafina Mitchell, MD;  Location: [place] CV LAB;  Service: Cardiovascular;  Laterality: N/A;  . PERIPHERAL VASCULAR CATHETERIZATION N/A 11/06/2015   Procedure: Lower Extremity Angiography;  Surgeon: Serafina Mitchell, MD;  Location: [place] CV LAB;  Service: Cardiovascular;  Laterality: N/A;  . PERIPHERAL VASCULAR CATHETERIZATION  11/06/2015   Procedure: Peripheral Vascular Atherectomy;  Surgeon: Serafina Mitchell, MD;  Location: [place] CV LAB;  Service: Cardiovascular;;  Left Superficial femoral  . SHUNTOGRAM Left 05/15/2014   Procedure: SHUNTOGRAM;  Surgeon: Conrad [place], MD;  Location: [facility] CATH LAB;  Service: Cardiovascular;  Laterality: Left;  . SMALL INTESTINE SURGERY  90's  . SPINAL GROWTH RODS  2010   "put 2 metal rods in my back; they had detetriorated" (08/30/2012)  . THROMBECTOMY    . THROMBECTOMY AND REVISION OF ARTERIOVENTOUS (AV) GORETEX  GRAFT Left 03/30/2014  . THROMBECTOMY AND REVISION OF ARTERIOVENTOUS (AV) GORETEX  GRAFT Left 03/30/2014   Procedure: THROMBECTOMY AND REVISION OF ARTERIOVENTOUS (AV) GORETEX  GRAFT;  Surgeon: Conrad [place], MD;  Location: [place];  Service: Vascular;  Laterality: Left;  . TUBAL LIGATION  1990's   Family History  Problem Relation Age of Onset  . Cancer Mother   . Diabetes Mother   . Hypertension Mother   . Stroke Mother   . Cancer Father   . Anesthesia problems Neg Hx   . Hypotension Neg Hx   . Malignant hyperthermia Neg Hx   . Pseudochol deficiency Neg Hx    Social History:  reports that she has been smoking Cigarettes.  She has a 20.00 pack-year smoking history. She has never used smokeless tobacco. She reports that she does not drink alcohol or use drugs. Allergies:  Allergies  Allergen Reactions  . Acetaminophen Other (See Comments)    Liver transplant recipient   . Codeine Itching  . Mirtazapine  Other (See Comments)    hallucination   Medications Prior to Admission  Medication Sig Dispense Refill  . aspirin EC 81 MG tablet Take 81 mg by mouth daily at 12 noon.     . Calcium Carbonate Antacid (TUMS PO) Take 1 tablet by mouth daily with lunch.    . clindamycin (CLEOCIN) 150 MG capsule Take 150 mg by mouth 3 (three) times daily. 7 day course started 06/29/16 pm    . collagenase (SANTYL) ointment Apply 1 application topically daily.    [name] levothyroxine (SYNTHROID, LEVOTHROID) 175 MCG tablet Take 175 mcg by mouth daily at 12 noon.    . methocarbamol (ROBAXIN) 500 MG tablet Take 0.5 tablets (250 mg total) by mouth every 6 (six) hours as needed for muscle spasms. 30 tablet 0  . metoCLOPramide (REGLAN) 5  MG tablet Take 1 tablet (5 mg total) by mouth 2 (two) times daily before a meal. Decrease to once a day before breakfast after a couple of weeks. (Patient taking differently: Take 5 mg by mouth daily before lunch. ) 30 tablet 0  . metoprolol tartrate (LOPRESSOR) 25 MG tablet Take 1 tablet (25 mg total) by mouth 2 (two) times daily. (Patient taking differently: Take 25 mg by mouth See admin instructions. Take 1 tablet (25 mg) by mouth twice daily - 11am and 11pm) 60 tablet 0  . multivitamin (RENA-VIT) TABS tablet Take 1 tablet by mouth at bedtime. 30 tablet 0  . nitroGLYCERIN (NITROSTAT) 0.4 MG SL tablet Place 1 tablet (0.4 mg total) under the tongue every 5 (five) minutes as needed for chest pain. 30 tablet 0  . ondansetron (ZOFRAN ODT) 4 MG disintegrating tablet Take 1 tablet (4 mg total) by mouth every 8 (eight) hours as needed for nausea. 30 tablet 0  . oxyCODONE (OXY IR/ROXICODONE) 5 MG immediate release tablet Take 1 tablet (5 mg total) by mouth 2 (two) times daily as needed for severe pain or breakthrough pain. (Patient taking differently: Take 5 mg by mouth every 8 (eight) hours as needed for severe pain or breakthrough pain. ) 30 tablet 0  . polyethylene glycol (MIRALAX / GLYCOLAX) packet  Take 17 g by mouth 2 (two) times daily. (Patient taking differently: Take 17 g by mouth 2 (two) times daily as needed (constipation). Mix in 8 oz liquid and drink) 60 each 0  . pregabalin (LYRICA) 75 MG capsule Take 1 capsule (75 mg total) by mouth daily. (Patient taking differently: Take 75 mg by mouth 3 (three) times daily. ) 30 capsule 0  . senna-docusate (SENOKOT-S) 8.6-50 MG tablet Take 1 tablet by mouth daily at 12 noon.     . tacrolimus (PROGRAF) 1 MG capsule Take 2 capsules (2 mg total) by mouth 2 (two) times daily. Take 2 capsules twice daily. (Patient taking differently: Take 2 mg by mouth 2 (two) times daily. ) 120 capsule 0  . calcitRIOL (ROCALTROL) 0.5 MCG capsule Take 1 capsule (0.5 mcg total) by mouth every Monday, Wednesday, and Friday with hemodialysis. 30 capsule 1  . feeding supplement (BOOST / RESOURCE BREEZE) LIQD Take 1 Container by mouth 3 (three) times daily between meals. (Patient not taking: Reported on 06/30/2016) 1 Container 100  . HYDROmorphone (DILAUDID) 2 MG tablet Take 1 tablet (2 mg total) by mouth at bedtime as needed for severe pain. (Patient not taking: Reported on 06/30/2016) 15 tablet 0  . pantoprazole (PROTONIX) 40 MG tablet Take 1 tablet (40 mg total) by mouth daily. (Patient not taking: Reported on 06/30/2016) 30 tablet 1  . traMADol (ULTRAM) 50 MG tablet Take 1 tablet (50 mg total) by mouth every 6 (six) hours as needed for moderate pain. (Patient not taking: Reported on 06/30/2016) 60 tablet 0  . warfarin (COUMADIN) 2.5 MG tablet Take 1 tablet (2.5 mg total) by mouth daily at 6 PM. (Patient taking differently: Take 1.25-2.5 mg by mouth See admin instructions. Take 1 tablet (2.5 mg) by mouth on Tuesday, Thursday and Saturday at bedtime, take 1/2 tablet (1.25 mg) on Monday, Wednesday and Friday at bedtime) 30 tablet 0    Home: Home Living Family/patient expects to be discharged to:: Inpatient rehab Living Arrangements: Children Available Help at Discharge: Family,  Available PRN/intermittently Type of Home: Mobile home Home Access: Ramped entrance Home Layout: One level Bathroom Shower/Tub: Chiropodist: Handicapped height  Bathroom Accessibility: Yes Home Equipment: Bedside commode, Walker - 2 wheels, Wheelchair - manual Additional Comments: A ramp has been built for her to enter home   Functional History: Prior Function Level of Independence: Needs assistance Gait / Transfers Assistance Needed: w/c for mobility. daughter assisting with pivoting to w/c and toilet ADL's / Homemaking Assistance Needed: daughter assisting with ADL. only sponge bathing at this time  Functional Status:  Mobility: Bed Mobility Overal bed mobility: Needs Assistance Bed Mobility: Supine to Sit, Sit to Supine Supine to sit: Max assist Sit to supine: Mod assist, HOB elevated General bed mobility comments: Assist bring hips around to EOB and for trunk elevation to sitting. Pt using bed rail to assist with pulling self up into sitting Transfers Overall transfer level: Needs assistance Transfers: Government social research officer transfers: Max assist, Total assist, +2 physical assistance General transfer comment: Max assist +2 for transfer to [facility], total assist +2 for return to bed due to fatigue. Cues throughout for weight shift and technique      ADL: ADL Overall ADL's : Needs assistance/impaired Eating/Feeding: Set up, Bed level Grooming: Min guard, Sitting Upper Body Bathing: Minimal assistance, Sitting Lower Body Bathing: Total assistance, Bed level Upper Body Dressing : Minimal assistance, Sitting Lower Body Dressing: Total assistance, Bed level Toilet Transfer: Maximal assistance, Total assistance, +2 for physical assistance, Anterior/posterior, BSC Toilet Transfer Details (indicate cue type and reason): Max assist to [facility], total assist for return to bed Functional mobility during ADLs: Maximal assistance, Total assistance, +2  for physical assistance (for AP transfer)  Cognition: Cognition Overall Cognitive Status: Within Functional Limits for tasks assessed Orientation Level: Oriented X4 Cognition Arousal/Alertness: Awake/alert Behavior During Therapy: WFL for tasks assessed/performed Overall Cognitive Status: Within Functional Limits for tasks assessed General Comments: Pt was in a lot of pain from R BKA and a "sore on her bottom"  Physical Exam: Blood pressure (!) 114/54, pulse 78, temperature 98.4 F (36.9 C), temperature source Oral, resp. rate 18, height _0  (1.626 m), weight 53.5 kg (117 lb 15.1 oz), SpO2 91 %. Physical Exam  Vitals reviewed. Constitutional:  61 year old right handed frail female  HENT:  Head: Normocephalic.  Right Ear: External ear normal.  Left Ear: External ear normal.  Eyes: EOM are normal. Pupils are equal, round, and reactive to light.  Neck: Normal range of motion. Neck supple. No tracheal deviation present. No thyromegaly present.  Cardiovascular: Normal rate, regular rhythm and normal heart sounds.   Respiratory: Effort normal and breath sounds normal. No respiratory distress. She has no wheezes. She has no rales.  GI: Soft. Bowel sounds are normal. She exhibits no distension.  Skin: Skin is warm.  Skin. Right BKA is dressed and appropriately tender, well-approximated. Recent left BKA still a small amount of drainage at incision line along lateral aspect---two small openings only Neurological:  .  A little drowsy from just having received pain medication. Oriented to place/person/date. Motor: (limited by pain): BL UE 4+/5 proximal to distal LLE: HF 4-/5 without inhibition RLE: HF 2/5 and limited by pain Psych: pleasant and appropriate.    Results for orders placed or performed during the hospital encounter of 06/30/16 (from the past 48 hour(s))  Protime-INR     Status: Abnormal   Collection Time: 07/08/16  7:39 AM  Result Value Ref Range   Prothrombin Time 18.3  (H) 11.4 - 15.2 seconds   INR 1.50   CBC     Status: Abnormal   Collection Time: 07/08/16  7:39  AM  Result Value Ref Range   WBC 16.5 (H) 4.0 - 10.5 K/uL   RBC 3.60 (L) 3.87 - 5.11 MIL/uL   Hemoglobin 10.3 (L) 12.0 - 15.0 g/dL   HCT 29.6 (L) 36.0 - 46.0 %   MCV 82.2 78.0 - 100.0 fL   MCH 28.6 26.0 - 34.0 pg   MCHC 34.8 30.0 - 36.0 g/dL   RDW 18.0 (H) 11.5 - 15.5 %   Platelets 140 (L) 150 - 400 K/uL  Heparin level (unfractionated)     Status: Abnormal   Collection Time: 07/08/16  7:39 AM  Result Value Ref Range   Heparin Unfractionated 0.27 (L) 0.30 - 0.70 IU/mL    Comment:        IF HEPARIN RESULTS ARE BELOW EXPECTED VALUES, AND PATIENT DOSAGE HAS BEEN CONFIRMED, SUGGEST FOLLOW UP TESTING OF ANTITHROMBIN III LEVELS.   Basic metabolic panel     Status: Abnormal   Collection Time: 07/08/16  7:39 AM  Result Value Ref Range   Sodium 130 (L) 135 - 145 mmol/L   Potassium 4.4 3.5 - 5.1 mmol/L    Comment: NO VISIBLE HEMOLYSIS   Chloride 98 (L) 101 - 111 mmol/L   CO2 23 22 - 32 mmol/L   Glucose, Bld 104 (H) 65 - 99 mg/dL   BUN 7 6 - 20 mg/dL   Creatinine, Ser 4.01 (H) 0.44 - 1.00 mg/dL    Comment: DELTA CHECK NOTED   Calcium 9.1 8.9 - 10.3 mg/dL   GFR calc non Af Amer 11 (L) >60 mL/min   GFR calc Af Amer 13 (L) >60 mL/min    Comment: (NOTE) The eGFR has been calculated using the CKD EPI equation. This calculation has not been validated in all clinical situations. eGFR's persistently <60 mL/min signify possible Chronic Kidney Disease.    Anion gap 9 5 - 15  Glucose, capillary     Status: Abnormal   Collection Time: 07/08/16  8:05 AM  Result Value Ref Range   Glucose-Capillary 106 (H) 65 - 99 mg/dL  Glucose, capillary     Status: Abnormal   Collection Time: 07/08/16 11:35 AM  Result Value Ref Range   Glucose-Capillary 57 (L) 65 - 99 mg/dL  Glucose, capillary     Status: Abnormal   Collection Time: 07/08/16 12:49 PM  Result Value Ref Range   Glucose-Capillary 164 (H) 65  - 99 mg/dL  Glucose, capillary     Status: Abnormal   Collection Time: 07/08/16  2:42 PM  Result Value Ref Range   Glucose-Capillary 129 (H) 65 - 99 mg/dL   Comment 1 Notify RN    Comment 2 Document in Chart   Glucose, capillary     Status: None   Collection Time: 07/08/16  5:28 PM  Result Value Ref Range   Glucose-Capillary 92 65 - 99 mg/dL  Glucose, capillary     Status: Abnormal   Collection Time: 07/08/16  5:54 PM  Result Value Ref Range   Glucose-Capillary 101 (H) 65 - 99 mg/dL  Glucose, capillary     Status: Abnormal   Collection Time: 07/08/16  9:08 PM  Result Value Ref Range   Glucose-Capillary 185 (H) 65 - 99 mg/dL  CBC     Status: Abnormal   Collection Time: 07/09/16  3:38 AM  Result Value Ref Range   WBC 16.1 (H) 4.0 - 10.5 K/uL    Comment: WHITE COUNT CONFIRMED ON SMEAR   RBC 3.15 (L) 3.87 - 5.11 MIL/uL  Hemoglobin 9.2 (L) 12.0 - 15.0 g/dL   HCT 26.2 (L) 36.0 - 46.0 %   MCV 83.2 78.0 - 100.0 fL   MCH 29.2 26.0 - 34.0 pg   MCHC 35.1 30.0 - 36.0 g/dL   RDW 19.0 (H) 11.5 - 15.5 %   Platelets 154 150 - 400 K/uL    Comment: PLATELET COUNT CONFIRMED BY SMEAR  Heparin level (unfractionated)     Status: Abnormal   Collection Time: 07/09/16  3:38 AM  Result Value Ref Range   Heparin Unfractionated 0.20 (L) 0.30 - 0.70 IU/mL    Comment:        IF HEPARIN RESULTS ARE BELOW EXPECTED VALUES, AND PATIENT DOSAGE HAS BEEN CONFIRMED, SUGGEST FOLLOW UP TESTING OF ANTITHROMBIN III LEVELS.   Protime-INR     Status: Abnormal   Collection Time: 07/09/16  3:38 AM  Result Value Ref Range   Prothrombin Time 19.3 (H) 11.4 - 15.2 seconds   INR 1.60   Glucose, capillary     Status: Abnormal   Collection Time: 07/09/16  7:55 AM  Result Value Ref Range   Glucose-Capillary 140 (H) 65 - 99 mg/dL  CBC     Status: Abnormal   Collection Time: 07/09/16  9:38 AM  Result Value Ref Range   WBC 20.6 (H) 4.0 - 10.5 K/uL    Comment: REPEATED TO VERIFY WHITE COUNT CONFIRMED ON SMEAR     RBC 2.94 (L) 3.87 - 5.11 MIL/uL   Hemoglobin 8.4 (L) 12.0 - 15.0 g/dL   HCT 24.1 (L) 36.0 - 46.0 %   MCV 82.0 78.0 - 100.0 fL   MCH 28.6 26.0 - 34.0 pg   MCHC 34.9 30.0 - 36.0 g/dL   RDW 18.2 (H) 11.5 - 15.5 %   Platelets 135 (L) 150 - 400 K/uL    Comment: REPEATED TO VERIFY PLATELET COUNT CONFIRMED BY SMEAR   Renal function panel     Status: Abnormal   Collection Time: 07/09/16  9:38 AM  Result Value Ref Range   Sodium 130 (L) 135 - 145 mmol/L   Potassium 4.2 3.5 - 5.1 mmol/L   Chloride 97 (L) 101 - 111 mmol/L   CO2 24 22 - 32 mmol/L   Glucose, Bld 151 (H) 65 - 99 mg/dL   BUN 12 6 - 20 mg/dL   Creatinine, Ser 5.28 (H) 0.44 - 1.00 mg/dL   Calcium 9.0 8.9 - 10.3 mg/dL   Phosphorus 5.1 (H) 2.5 - 4.6 mg/dL   Albumin 1.8 (L) 3.5 - 5.0 g/dL   GFR calc non Af Amer 8 (L) >60 mL/min   GFR calc Af Amer 9 (L) >60 mL/min    Comment: (NOTE) The eGFR has been calculated using the CKD EPI equation. This calculation has not been validated in all clinical situations. eGFR's persistently <60 mL/min signify possible Chronic Kidney Disease.    Anion gap 9 5 - 15  Glucose, capillary     Status: Abnormal   Collection Time: 07/09/16  5:31 PM  Result Value Ref Range   Glucose-Capillary 162 (H) 65 - 99 mg/dL  Heparin level (unfractionated)     Status: None   Collection Time: 07/09/16  6:15 PM  Result Value Ref Range   Heparin Unfractionated 0.49 0.30 - 0.70 IU/mL    Comment:        IF HEPARIN RESULTS ARE BELOW EXPECTED VALUES, AND PATIENT DOSAGE HAS BEEN CONFIRMED, SUGGEST FOLLOW UP TESTING OF ANTITHROMBIN III LEVELS.   Glucose, capillary  Status: Abnormal   Collection Time: 07/09/16  8:14 PM  Result Value Ref Range   Glucose-Capillary 240 (H) 65 - 99 mg/dL  Glucose, capillary     Status: Abnormal   Collection Time: 07/09/16 10:34 PM  Result Value Ref Range   Glucose-Capillary 234 (H) 65 - 99 mg/dL  Glucose, capillary     Status: Abnormal   Collection Time: 07/10/16  6:04 AM    Result Value Ref Range   Glucose-Capillary 174 (H) 65 - 99 mg/dL   Comment 1 Notify RN    Comment 2 Document in Chart   Heparin level (unfractionated)     Status: None   Collection Time: 07/10/16  6:29 AM  Result Value Ref Range   Heparin Unfractionated 0.40 0.30 - 0.70 IU/mL    Comment:        IF HEPARIN RESULTS ARE BELOW EXPECTED VALUES, AND PATIENT DOSAGE HAS BEEN CONFIRMED, SUGGEST FOLLOW UP TESTING OF ANTITHROMBIN III LEVELS.   Renal function panel     Status: Abnormal   Collection Time: 07/10/16  6:29 AM  Result Value Ref Range   Sodium 130 (L) 135 - 145 mmol/L   Potassium 3.9 3.5 - 5.1 mmol/L   Chloride 96 (L) 101 - 111 mmol/L   CO2 23 22 - 32 mmol/L   Glucose, Bld 166 (H) 65 - 99 mg/dL   BUN 6 6 - 20 mg/dL   Creatinine, Ser 3.39 (H) 0.44 - 1.00 mg/dL    Comment: DELTA CHECK NOTED   Calcium 8.9 8.9 - 10.3 mg/dL   Phosphorus 4.3 2.5 - 4.6 mg/dL   Albumin 1.8 (L) 3.5 - 5.0 g/dL   GFR calc non Af Amer 14 (L) >60 mL/min   GFR calc Af Amer 16 (L) >60 mL/min    Comment: (NOTE) The eGFR has been calculated using the CKD EPI equation. This calculation has not been validated in all clinical situations. eGFR's persistently <60 mL/min signify possible Chronic Kidney Disease.    Anion gap 11 5 - 15  CBC     Status: Abnormal   Collection Time: 07/10/16  6:29 AM  Result Value Ref Range   WBC 23.5 (H) 4.0 - 10.5 K/uL   RBC 3.24 (L) 3.87 - 5.11 MIL/uL   Hemoglobin 9.4 (L) 12.0 - 15.0 g/dL   HCT 26.9 (L) 36.0 - 46.0 %   MCV 83.0 78.0 - 100.0 fL   MCH 29.0 26.0 - 34.0 pg   MCHC 34.9 30.0 - 36.0 g/dL   RDW 19.1 (H) 11.5 - 15.5 %   Platelets 137 (L) 150 - 400 K/uL  Protime-INR     Status: Abnormal   Collection Time: 07/10/16  6:29 AM  Result Value Ref Range   Prothrombin Time 20.9 (H) 11.4 - 15.2 seconds   INR 1.78    No results found.     Medical Problem List and Plan: 1.  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  -admit to inpatient rehab  -needs KI for right leg, apparently biotech consulted on acute  - Awaiting plan for left prosthesis as stump heals 2.  DVT Prophylaxis/Anticoagulation: Chronic Coumadin therapy for history of mechanical aortic valve 3. Pain Management: Lyrica 75 mg daily, oxycodone and Robaxin as needed   -limit as possible given obvious sedation4. Mood: Provide emotional support 5. Neuropsych: This patient is capable of making decisions on her own behalf. 6. Skin/Wound Care: Routine skin checks 7.  Fluids/Electrolytes/Nutrition: Routine I&O with follow-up chemistries 8. ESRD. Continue dialysis as directed. HD to occur at the end of the day to allow for participation in rehab activities 9. Leukocytosis. Follow-up labs 10. Acute on chronic anemia. Continue Aranesp. Follow-up CBC 11. Hypertension. Lopressor 25 mg twice a day. Monitor with increased mobility 12. CAD/AVR. Continue chronic Coumadin 13. History of liver transplant. Continue Prograf 14. Diabetes mellitus of peripheral neuropathy. Hemoglobin A1c 5.7.Bouts of hypoglycemia with sliding scale discontinued 15. Hypothyroidism. Synthroid 16. Constipation. Laxative assistance  Post Admission Physician Evaluation: 1. Functional deficits secondary  to new right BKA and recent left BKA. 2. Patient is admitted to receive collaborative, interdisciplinary care between the physiatrist, rehab nursing staff, and therapy team. 3. Patient's level of medical complexity and substantial therapy needs in context of that medical necessity cannot be provided at a lesser intensity of care such as a SNF. 4. Patient has experienced substantial functional loss from his/her baseline which was documented above under the "Functional History" and "Functional Status" headings.  Judging by the patient's diagnosis, physical exam, and functional history, the patient has potential for functional progress which will result in measurable gains  while on inpatient rehab.  These gains will be of substantial and practical use upon discharge  in facilitating mobility and self-care at the household level. 5. Physiatrist will provide 24 hour management of medical needs as well as oversight of the therapy plan/treatment and provide guidance as appropriate regarding the interaction of the two. 6. The Preadmission Screening has been reviewed and patient status is unchanged unless otherwise stated above. 7. 24 hour rehab nursing will assist with bladder management, bowel management, safety, skin/wound care, disease management, medication administration, pain management and patient education  and help integrate therapy concepts, techniques,education, etc. 8. PT will assess and treat for/with: Lower extremity strength, range of motion, stamina, balance, functional mobility, safety, adaptive techniques and equipment, pre-prosthetic education/stump mgt, pain control.   Goals are: supervision to min assist at w/c level. 9. OT will assess and treat for/with: ADL's, functional mobility, safety, upper extremity strength, adaptive techniques and equipment, pain mgt, family edu.   Goals are: supervision to min assist. Therapy may not yet proceed with showering this patient. 10. SLP will assess and treat for/with: n/a.  Goals are: n/a. 11. Case Management and Social Worker will assess and treat for psychological issues and discharge planning. 12. Team conference will be held weekly to assess progress toward goals and to determine barriers to discharge. 13. Patient will receive at least 3 hours of therapy per day at least 5 days per week. 14. ELOS: 10-14 days       15. Prognosis:  excellent     [name], MD, [facility] 07/10/2016  Cathlyn Parsons., PA-C 07/10/2016

## 2016-07-10 NOTE — PMR Pre-admission (Signed)
PMR Admission Coordinator Pre-Admission Assessment  Patient: [name] is an 61 y.o., female MRN: [id number] DOB: April 25, 1955 Height: 5\' 4"  (162.6 cm) Weight: 53.5 kg (117 lb 15.1 oz)              Insurance Information HMO:     PPO:      PCP:      IPA:      80/20: yes     OTHER: no HMO PRIMARY: Medicare a and b      Policy#: [id number]      Subscriber: pt Benefits:  Phone #: passport one online     Name: 07/09/16 Eff. Date: 04/11/2011     Deduct: $1340      Out of Pocket Max: none      Life Max: none CIR: 100%      SNF: 20 full days Outpatient: 80%     Co-Pay: 20% Home Health: 100%      Co-Pay: none DME: 80%     Co-Pay: 20% Providers: pt choice  SECONDARY: BC[employer] of [place] supplement   Policy#: [id number]      Subscriber: pt  Medicaid Application Date:       Case Manager:  Disability Application Date:       Case Worker:   Emergency Contact Information Contact Information    Name Relation Home Work Chicken Daughter   [phone]   [name] Daughter [phone]      Current Medical History  Patient Admitting Diagnosis: right BKA with history of L BKA  History of Present Illness:   : HPI: [name] a 61 y.o.right handed femalewith history of hypertension, diabetes mellitus, CAD, chronic back pain, end-stage renal disease with hemodialysis, mechanical aortic valve on chronic Coumadin, history of liver transplant, left BKA March 2018 and received inpatient rehabilitation services. She was discharged to home with home health therapies. Essentially wheelchair bound prior to admission. She has yet to receive a prosthesis as prior BKA is healing. Presented 06/30/2016 with gangrenous right heel and right great toe with findings of infrainguinal arterial occlusive disease. No change with conservative care and underwent right below knee amputation 07/08/2016 per Dr. [name]. Hospital course pain management.Teacher, music for  fitting of Limb guard. Acute on chronic anemia 9.4 and monitor. Leukocytosis 20,600-23,000. Presently on intravenous heparin and chronic Coumadin resumed. Hemodialysis ongoing as per renal services.  Past Medical History  Past Medical History:  Diagnosis Date  . Anemia    takes Folic Acid daily  . Anxiety   . Aortic stenosis   . Arthritis    "left hand, back" (08/30/2012)  . Asthma   . CAD (coronary artery disease)   . CAD (coronary artery disease) Jan. 2015   Cath: 20% LAD, 50% D1; s/p LIMA-LAD  . Chest pain 03/06/2015  . CHF (congestive heart failure) ([place]) 05/2016  . Chronic back pain   . Chronic bronchitis ([place])    "q yr w/season changes" (08/30/2012)  . Chronic constipation    takes MIralax and Colace daily  . COPD (chronic obstructive pulmonary disease) ([place])   . Depression    takes Cymbalta for "severe" depression  . End stage renal disease on dialysis [facility]) 02/27/2011   Kidneys shut down at time of liver transplant in Sept 2011 at [facility] in [place], she has been on HD ever since.  Dialyzes at [facility] HD on TTS schedule.  Had L forearm graft used 10 months then removed Dec 2012 due to suspected infection.  A right upper arm AVG was placed Dec 2012 but she developed steal symptoms acutely and it was ligated the same day.  Never had an AV fistula due to small veins.  Now has L thigh AVG put in Jan 2013, has not clotted to date.    [name] GERD (gastroesophageal reflux disease)    takes Omeprazole daily  . Headache    "at least monthly" (08/30/2012)  . Hepatitis C   . History of blood transfusion    "several" (08/30/2012)  . Hypertension    takes Metoprolol and Lisinopril daily, sees Dr Bea Graff  . Hypothyroidism    takes Synthroid daily  . Migraine    "last migraine was in 2013" (08/30/2012)  . Neuromuscular disorder ([place])    carpal tunnel in right hand  . Obesity   . Peripheral vascular disease (HCC) hands and legs  . Pneumonia    "today and several times before" (08/30/2012)   . 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 S/P unilateral BKA (below knee amputation), left ([place]) 04/30/2016  . SVT (supraventricular tachycardia) ([place]) 06/09/14  . Tobacco abuse   . Type II diabetes mellitus (HCC)    Levemir 2units daily if > 150    Family History  family history includes Cancer in her father and mother; Diabetes in her mother; Hypertension in her mother; Stroke in her mother.  Prior Rehab/Hospitalizations:  Has the patient had major surgery during 100 days prior to admission? Yes   Left BKA 04/2016 and received CIR admission with discharge home with family and home health.  Current Medications   Current Facility-Administered Medications:  .  0.9 %  sodium chloride infusion, , Intravenous, Continuous, Belinda Block, MD, Stopped at 07/08/16 1359 .  acetaminophen (TYLENOL) tablet 650 mg, 650 mg, Oral, Q6H PRN **OR** acetaminophen (TYLENOL) suppository 650 mg, 650 mg, Rectal, Q6H PRN, Opyd, Timothy S, MD .  aspirin EC tablet 81 mg, 81 mg, Oral, Daily, Opyd, Ilene Qua, MD, 81 mg at 07/10/16 0954 .  calcitRIOL (ROCALTROL) capsule 0.5 mcg, 0.5 mcg, Oral, Q M,W,F-HD, Opyd, Ilene Qua, MD, 0.5 mcg at 07/09/16 1250 .  calcium acetate (PHOSLO) capsule 667 mg, 667 mg, Oral, TID WC, [name], PA-C, 667 mg at 07/10/16 1258 .  cefTRIAXone (ROCEPHIN) 2 g in dextrose 5 % 50 mL IVPB, 2 g, Intravenous, Q24H, Stopped at 07/10/16 0814 **AND** [DISCONTINUED] metroNIDAZOLE (FLAGYL) IVPB 500 mg, 500 mg, Intravenous, Q8H, Opyd, Ilene Qua, MD, Stopped at 07/09/16 0510 .  Chlorhexidine Gluconate Cloth 2 % PADS 6 each, 6 each, Topical, Daily, Rai, Ripudeep K, MD, 6 each at 07/08/16 1000 .  Darbepoetin Alfa (ARANESP) injection 60 mcg, 60 mcg, Intravenous, Q Mon-HD, Alric Seton, PA-C, 60 mcg at 07/07/16 0847 .  docusate sodium (COLACE) capsule 100  mg, 100 mg, Oral, Daily, Elam Dutch, MD, 100 mg at 07/10/16 0954 .  feeding supplement (BOOST / RESOURCE BREEZE) liquid 1 Container, 1 Container, Oral, TID BM, Patrecia Pour, MD, 1 Container at 07/10/16 1400 .  ferric gluconate (NULECIT) 62.5 mg in sodium chloride 0.9 % 100 mL IVPB, 62.5 mg, Intravenous, Q Sat-HD, Alric Seton, PA-C, Stopped at 07/05/16 1214 .  gi cocktail (Maalox,Lidocaine,Donnatal), 30 mL, Oral, BID PRN, Rai, Ripudeep K, MD, 30 mL at 07/08/16 0818 .  heparin ADULT infusion 100 units/mL (  25000 units/254mL sodium chloride 0.45%), 1,050 Units/hr, Intravenous, Continuous, Franky Macho, RPH, Last Rate: 10.5 mL/hr at 07/09/16 1037, 1,050 Units/hr at 07/09/16 1037 .  hydrALAZINE (APRESOLINE) injection 5 mg, 5 mg, Intravenous, Q20 Min PRN, Fields, Charles E, MD .  HYDROmorphone (DILAUDID) tablet 2-4 mg, 2-4 mg, Oral, Q6H PRN, Patrecia Pour, MD, 4 mg at 07/10/16 0740 .  labetalol (NORMODYNE,TRANDATE) injection 10 mg, 10 mg, Intravenous, Q10 min PRN, Fields, Charles E, MD .  levothyroxine (SYNTHROID, LEVOTHROID) tablet 175 mcg, 175 mcg, Oral, QAC breakfast, Opyd, Ilene Qua, MD, 175 mcg at 07/10/16 0740 .  methocarbamol (ROBAXIN) tablet 250 mg, 250 mg, Oral, Q6H PRN, Opyd, Ilene Qua, MD, 250 mg at 07/10/16 0955 .  metoCLOPramide (REGLAN) tablet 5 mg, 5 mg, Oral, BID AC, Opyd, Ilene Qua, MD, 5 mg at 07/10/16 0741 .  metoprolol tartrate (LOPRESSOR) injection 2-5 mg, 2-5 mg, Intravenous, Q2H PRN, Fields, Charles E, MD .  metoprolol tartrate (LOPRESSOR) tablet 25 mg, 25 mg, Oral, BID, Opyd, Ilene Qua, MD, 25 mg at 07/10/16 0954 .  multivitamin (RENA-VIT) tablet 1 tablet, 1 tablet, Oral, QHS, Opyd, Ilene Qua, MD, 1 tablet at 07/09/16 2147 .  mupirocin ointment (BACTROBAN) 2 % 1 application, 1 application, Nasal, BID, Rai, Ripudeep K, MD, 1 application at 23/76/28 2148 .  ondansetron (ZOFRAN) tablet 4 mg, 4 mg, Oral, Q6H PRN, 4 mg at 07/01/16 0838 **OR** ondansetron (ZOFRAN) injection 4  mg, 4 mg, Intravenous, Q6H PRN, Opyd, Ilene Qua, MD, 4 mg at 07/08/16 1356 .  oxyCODONE (Oxy IR/ROXICODONE) immediate release tablet 5 mg, 5 mg, Oral, Q4H PRN, Pablo Ledger, Kimberly A, PA-C, 5 mg at 07/10/16 1258 .  polyethylene glycol (MIRALAX / GLYCOLAX) packet 17 g, 17 g, Oral, BID, Opyd, Ilene Qua, MD, 17 g at 07/10/16 0955 .  pregabalin (LYRICA) capsule 75 mg, 75 mg, Oral, Daily, Opyd, Ilene Qua, MD, 75 mg at 07/10/16 0954 .  senna-docusate (Senokot-S) tablet 1 tablet, 1 tablet, Oral, BID, Rai, Ripudeep K, MD, 1 tablet at 07/10/16 0954 .  tacrolimus (PROGRAF) capsule 2 mg, 2 mg, Oral, BID, Opyd, Ilene Qua, MD, 2 mg at 07/10/16 0955 .  warfarin (COUMADIN) tablet 3 mg, 3 mg, Oral, ONCE-1800, Rolla Flatten, [facility] .  Warfarin - Pharmacist Dosing Inpatient, , Does not apply, q1800, Rolla Flatten, [facility]  Patients Current Diet: Diet regular Room service appropriate? Yes; Fluid consistency: Thin  Precautions / Restrictions Precautions Precautions: Fall Restrictions Weight Bearing Restrictions: Yes RLE Weight Bearing: Non weight bearing LLE Weight Bearing: Non weight bearing   Has the patient had 2 or more falls or a fall with injury in the past year?No  Prior Activity Level Limited Community (1-2x/wk): goes to hemodialyiss 3 times per week and to [employer] / [place] Devices/Equipment: Wheelchair Home Equipment: Bedside commode, Environmental consultant - 2 wheels, Wheelchair - manual  Prior Device Use: Indicate devices/aids used by the patient prior to current illness, exacerbation or injury? wheelchair level  Prior Functional Level Prior Function Level of Independence: Needs assistance Gait / Transfers Assistance Needed: w/c for mobility. daughter assisting with pivoting to w/c and toilet ADL's / Homemaking Assistance Needed: daughter assisting with ADL. only sponge bathing at this time  Self Care: Did the patient need help bathing, dressing, using the  toilet or eating?  Needed some help  Indoor Mobility: Did the patient need assistance with walking from room to room (with or without device)? Needed some help  Stairs: Did  the patient need assistance with internal or external stairs (with or without device)? Needed some help  Functional Cognition: Did the patient need help planning regular tasks such as shopping or remembering to take medications? Needed some help  Current Functional Level Cognition  Overall Cognitive Status: Within Functional Limits for tasks assessed Orientation Level: Oriented X4 General Comments: Pt was in a lot of pain from R BKA and a "sore on her bottom"    Extremity Assessment (includes Sensation/Coordination)  Upper Extremity Assessment: Generalized weakness  Lower Extremity Assessment: Defer to PT evaluation RLE Deficits / Details: dec ROM and extremely sensitive to pain RLE: Unable to fully assess due to pain LLE: Unable to fully assess due to pain    ADLs  Overall ADL's : Needs assistance/impaired Eating/Feeding: Set up, Bed level Grooming: Min guard, Sitting Upper Body Bathing: Minimal assistance, Sitting Lower Body Bathing: Total assistance, Bed level Upper Body Dressing : Minimal assistance, Sitting Lower Body Dressing: Total assistance, Bed level Toilet Transfer: Maximal assistance, Total assistance, +2 for physical assistance, Anterior/posterior, BSC Toilet Transfer Details (indicate cue type and reason): Max assist to [facility], total assist for return to bed Functional mobility during ADLs: Maximal assistance, Total assistance, +2 for physical assistance (for AP transfer)    Mobility  Overal bed mobility: Needs Assistance Bed Mobility: Supine to Sit Supine to sit: Max assist Sit to supine: Mod assist, HOB elevated General bed mobility comments: Max A to sit upright in bed    Transfers  Overall transfer level: Needs assistance Equipment used: 2 person hand held assist Transfers: Lateral/Scoot  Transfers Anterior-Posterior transfers: Max assist, Total assist, +2 physical assistance  Lateral/Scoot Transfers: Max assist, +2 physical assistance General transfer comment: Max A lateral slide transfer from bed to recliner. Pt is more concerned about the pain and protecting her RLE than assistance with transfers.     Ambulation / Gait / Stairs / Office manager / Balance Dynamic Sitting Balance Sitting balance - Comments: Pt able to perform RLE desentisization with bil UEs sitting EOB Balance Overall balance assessment: Needs assistance Sitting-balance support: Bilateral upper extremity supported Sitting balance-Leahy Scale: Fair Sitting balance - Comments: Pt able to perform RLE desentisization with bil UEs sitting EOB Postural control: Posterior lean    Special needs/care consideration BiPAP/CPAP  N/a CPM  N/a Continuous Drip IV heparin IV bridge to coumadin Dialysis M W F at [place] dialysis. Daughter transports her Life Vest  N/a Oxygen  N/a Special BedTrach Size  N/a Wound Vac   N/a Skin Stage II coccyx wound with foam dressing noted; Dressing changed by Dr. Scot Dock 07/10/16 to R BKA surgical site. [employer] to fit pt for a limb guard  To right BKA.        WOC :07/01/2016        WOC  Consult Note Date of Service: 07/01/2016 10:24 AM      [] Hide copied text [] Hover for attribution information [place] Nurse wound consult note Reason for Consult: LE ulcer right foot, history of amputation on the left.  Patient with history of BKA per Dr. Oneida Alar a few months ago, wound has not healed fully Patient continues to smoke, counseled by Dr. Broadus John Patient reports new onset of limb pain in the right foot Wound type: Arterial ulceration: right great toe Mixed etiology arterial/pressure: right heel Non healing surgical site left BKA Pressure Injury POA: Yes Measurement: Right heel: 3cm x 3cm x0.1cm; 90% black eschar, not fluctuant, 10% pink  at wound edges; no  drainage  Right great toe: 3cm x 2cm x 0cm: 100% eschar, intact, no drainage Left stump site: 1.0cm x 3.5cm x 0.3; 50% scabbed/50%yellow; minimal drainage, no odor   Peri wound: intact, edema noted right foot, pulse not palpable, foot cool but not cold. Dressing procedure/placement/frequency:  ABIs pending Betadine to the right foot wounds to keep dry and stable until decision on need for surgical intervention Hydrogel to the stump wound, to add moisture for wound healing.  Discussed POC with patient and bedside nurse.  Re consult if needed, will not follow at this time. Thanks  Melody [employer] MSN, RN,CWOCN, CNS [phone])                    Bowel mgmt: continent LBM 07/08/16 Bladder mgmt: anuric Diabetic mgmt  Yes Hgb A1c 5.7 Out patient hemodialysis [place] MWF   Previous Home Environment Living Arrangements: Children  Lives With: Family Available Help at Discharge: Family, Available PRN/intermittently Type of Home: Mobile home Home Layout: One level Home Access: Ramped entrance Bathroom Shower/Tub: Chiropodist: Handicapped height Bathroom Accessibility: Yes How Accessible: Accessible via wheelchair [place]: Yes Type of [place]: [place] (if known): [facility] Additional Comments: A ramp has been built for her to enter home  Discharge Living Setting Plans for Discharge Living Setting: Patient's home, Lives with (comment) (daughter, [name]) Type of Home at Discharge: Mobile home Discharge Home Layout: One level Discharge Home Access: [place] entrance Discharge Bathroom Shower/Tub: [place] unit Discharge Bathroom Toilet: Handicapped height Discharge Bathroom Accessibility: Yes How Accessible: Accessible via wheelchair Does the patient have any problems obtaining your medications?: Yes (Describe)  Social/Family/Support Systems Patient Roles: Parent Contact Information: [name], daughter  and medical POA Anticipated Caregiver: [name] and Water quality scientist, two daughters Anticipated Caregiver's Contact Information: see above Discharge Plan Discussed with Primary Caregiver: Yes Is Caregiver In Agreement with Plan?: Yes Does Caregiver/Family have Issues with Lodging/Transportation while Pt is in Rehab?: No   [name] in school, but her sister, Nira Conn or her boyfriend stay with pt when she is in school. They provided 24/7 supervision after d/c from CIR last time for about 3 weeks.  Goals/Additional Needs Patient/Family Goal for Rehab: supervision to min assist wheelchair level for PT and OT Expected length of stay: ELOS 10- 14 days Special Service Needs: Hemodialysis three times per week Pt/Family Agrees to Admission and willing to participate: Yes Program Orientation Provided & Reviewed with Pt/Caregiver Including Roles  & Responsibilities: Yes  Decrease burden of Care through IP rehab admission: n/a  Possible need for SNF placement upon discharge: not anticipated  Patient Condition: This patient's condition remains as documented in the consult dated 07/09/2016, in which the Rehabilitation Physician determined and documented that the patient's condition is appropriate for intensive rehabilitative care in an inpatient rehabilitation facility. Will admit to inpatient rehab today.  Preadmission Screen Completed By:  Cleatrice Burke, 07/10/2016 1:59 PM ______________________________________________________________________   Discussed status with Dr. Naaman Plummer on 07/10/2016 at  1341 and received telephone approval for admission today.  Admission Coordinator:  Cleatrice Burke, time 1341 Date 07/10/2016.

## 2016-07-10 NOTE — Progress Notes (Signed)
[name], [name] Rehab Admission Coordinator Signed Physical Medicine and Rehabilitation  PMR Pre-admission Date of Service: 07/10/2016 10:53 AM      [] Hide copied text PMR Admission Coordinator Pre-Admission Assessment  Patient: [name] is an 61 y.o., female MRN: [id number] DOB: [birth year] Height: 5\' 4"  (162.6 cm) Weight: 53.5 kg (117 lb 15.1 oz)                                                                                                                                                  Insurance Information HMO:     PPO:      PCP:      IPA:      80/20: yes     OTHER: no HMO PRIMARY: Medicare a and b      Policy#: [id number]      Subscriber: pt Benefits:  Phone #: passport one online     Name: 07/09/16 Eff. Date: 04/11/2011     Deduct: $1340      Out of Pocket Max: none      Life Max: none CIR: 100%      SNF: 20 full days Outpatient: 80%     Co-Pay: 20% Home Health: 100%      Co-Pay: none DME: 80%     Co-Pay: 20% Providers: pt choice  SECONDARY: [employer] of [place] supplement   Policy#: [id number]      Subscriber: pt  Medicaid Application Date:       Case Manager:  Disability Application Date:       Case Worker:   Emergency Contact Information        Contact Information    Name Relation Home Work [name] Daughter   [phone]   Noralee Space Daughter [phone]      Current Medical History  Patient Admitting Diagnosis: right BKA with history of L BKA  History of Present Illness:   : HPI: [name] a 61 y.o.right handed femalewith history of hypertension, diabetes mellitus, CAD, chronic back pain, end-stage renal disease with hemodialysis, mechanical aortic valve on chronic Coumadin, history of liver transplant, left BKA March 2018 and received inpatient rehabilitation services. She was discharged to home with home health therapies. Essentially wheelchair bound prior to admission. She has yet to receive a  prosthesis as prior BKA is healing. Presented 06/30/2016 with gangrenous right heel and right great toe with findings of infrainguinal arterial occlusive disease. No change with conservative care and underwent right below knee amputation 07/08/2016 per [name]. Hospital course pain management.Teacher, music for fitting of Limb guard.Acute on chronic anemia 9.4and monitor. Leukocytosis 20,600-23,000. Presently on intravenous heparin and chronic Coumadin resumed. Hemodialysis ongoing as per renal services.  Past Medical History  Past Medical History:  Diagnosis Date  . Anemia    takes Folic Acid daily  . Anxiety   .  Aortic stenosis   . Arthritis    "left hand, back" (08/30/2012)  . Asthma   . CAD (coronary artery disease)   . CAD (coronary artery disease) Jan. 2015   Cath: 20% LAD, 50% D1; s/p LIMA-LAD  . Chest pain 03/06/2015  . CHF (congestive heart failure) ([place]) 05/2016  . Chronic back pain   . Chronic bronchitis ([place])    "q yr w/season changes" (08/30/2012)  . Chronic constipation    takes MIralax and Colace daily  . COPD (chronic obstructive pulmonary disease) ([place])   . Depression    takes Cymbalta for "severe" depression  . End stage renal disease on dialysis [facility]) 02/27/2011   Kidneys shut down at time of liver transplant in Sept 2011 at [facility] in [place], she has been on HD ever since.  Dialyzes at [facility] HD on TTS schedule.  Had L forearm graft used 10 months then removed Dec 2012 due to suspected infection.  A right upper arm AVG was placed Dec 2012 but she developed steal symptoms acutely and it was ligated the same day.  Never had an AV fistula due to small veins.  Now has L thigh AVG put in Jan 2013, has not clotted to date.    [name] GERD (gastroesophageal reflux disease)    takes Omeprazole daily  . Headache    "at least monthly" (08/30/2012)  . Hepatitis C   . History of blood transfusion    "several" (08/30/2012)  .  Hypertension    takes Metoprolol and Lisinopril daily, sees Dr Bea Graff  . Hypothyroidism    takes Synthroid daily  . Migraine    "last migraine was in 2013" (08/30/2012)  . Neuromuscular disorder ([place])    carpal tunnel in right hand  . Obesity   . Peripheral vascular disease (HCC) hands and legs  . Pneumonia    "today and several times before" (08/30/2012)  . 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 S/P unilateral BKA (below knee amputation), left ([place]) 04/30/2016  . SVT (supraventricular tachycardia) ([place]) 06/09/14  . Tobacco abuse   . Type II diabetes mellitus (HCC)    Levemir 2units daily if > 150    Family History  family history includes Cancer in her father and mother; Diabetes in her mother; Hypertension in her mother; Stroke in her mother.  Prior Rehab/Hospitalizations:  Has the patient had major surgery during 100 days prior to admission? Yes   Left BKA 04/2016 and received CIR admission with discharge home with family and home health.  Current Medications   Current Facility-Administered Medications:  .  0.9 %  sodium chloride infusion, , Intravenous, Continuous, [name] Block, MD, Stopped at 07/08/16 1359 .  acetaminophen (TYLENOL) tablet 650 mg, 650 mg, Oral, Q6H PRN **OR** acetaminophen (TYLENOL) suppository 650 mg, 650 mg, Rectal, Q6H PRN, Opyd, Timothy S, MD .  aspirin EC tablet 81 mg, 81 mg, Oral, Daily, Opyd, Ilene Qua, MD, 81 mg at 07/10/16 0954 .  calcitRIOL (ROCALTROL) capsule 0.5 mcg, 0.5 mcg, Oral, Q M,W,F-HD, Opyd, Ilene Qua, MD, 0.5 mcg at 07/09/16 1250 .  calcium acetate (PHOSLO) capsule 667 mg, 667 mg, Oral, TID WC, [name], PA-C, 667 mg at 07/10/16 1258 .  cefTRIAXone (ROCEPHIN) 2 g in dextrose 5 % 50 mL IVPB, 2 g, Intravenous, Q24H, Stopped at  07/10/16 0814 **AND** [DISCONTINUED]  metroNIDAZOLE (FLAGYL) IVPB 500 mg, 500 mg, Intravenous, Q8H, Opyd, Ilene Qua, MD, Stopped at 07/09/16 0510 .  Chlorhexidine Gluconate Cloth 2 % PADS 6 each, 6 each, Topical, Daily, Rai, Ripudeep K, MD, 6 each at 07/08/16 1000 .  Darbepoetin Alfa (ARANESP) injection 60 mcg, 60 mcg, Intravenous, Q Mon-HD, Alric Seton, PA-C, 60 mcg at 07/07/16 0847 .  docusate sodium (COLACE) capsule 100 mg, 100 mg, Oral, Daily, Elam Dutch, MD, 100 mg at 07/10/16 0954 .  feeding supplement (BOOST / RESOURCE BREEZE) liquid 1 Container, 1 Container, Oral, TID BM, Patrecia Pour, MD, 1 Container at 07/10/16 1400 .  ferric gluconate (NULECIT) 62.5 mg in sodium chloride 0.9 % 100 mL IVPB, 62.5 mg, Intravenous, Q Sat-HD, Alric Seton, PA-C, Stopped at 07/05/16 1214 .  gi cocktail (Maalox,Lidocaine,Donnatal), 30 mL, Oral, BID PRN, Rai, Ripudeep K, MD, 30 mL at 07/08/16 0818 .  heparin ADULT infusion 100 units/mL (25000 units/219mL sodium chloride 0.45%), 1,050 Units/hr, Intravenous, Continuous, Franky Macho, RPH, Last Rate: 10.5 mL/hr at 07/09/16 1037, 1,050 Units/hr at 07/09/16 1037 .  hydrALAZINE (APRESOLINE) injection 5 mg, 5 mg, Intravenous, Q20 Min PRN, Fields, Charles E, MD .  HYDROmorphone (DILAUDID) tablet 2-4 mg, 2-4 mg, Oral, Q6H PRN, Patrecia Pour, MD, 4 mg at 07/10/16 0740 .  labetalol (NORMODYNE,TRANDATE) injection 10 mg, 10 mg, Intravenous, Q10 min PRN, Fields, Charles E, MD .  levothyroxine (SYNTHROID, LEVOTHROID) tablet 175 mcg, 175 mcg, Oral, QAC breakfast, Opyd, Ilene Qua, MD, 175 mcg at 07/10/16 0740 .  methocarbamol (ROBAXIN) tablet 250 mg, 250 mg, Oral, Q6H PRN, Opyd, Ilene Qua, MD, 250 mg at 07/10/16 0955 .  metoCLOPramide (REGLAN) tablet 5 mg, 5 mg, Oral, BID AC, Opyd, Ilene Qua, MD, 5 mg at 07/10/16 0741 .  metoprolol tartrate (LOPRESSOR) injection 2-5 mg, 2-5 mg, Intravenous, Q2H PRN, Fields, Charles E, MD .  metoprolol tartrate (LOPRESSOR) tablet 25 mg, 25 mg, Oral, BID, Opyd, Ilene Qua, MD, 25 mg at 07/10/16 0954 .  multivitamin (RENA-VIT) tablet 1 tablet, 1 tablet, Oral, QHS, Opyd, Ilene Qua, MD, 1 tablet at 07/09/16 2147 .  mupirocin ointment (BACTROBAN) 2 % 1 application, 1 application, Nasal, BID, Rai, Ripudeep K, MD, 1 application at 40/81/44 2148 .  ondansetron (ZOFRAN) tablet 4 mg, 4 mg, Oral, Q6H PRN, 4 mg at 07/01/16 0838 **OR** ondansetron (ZOFRAN) injection 4 mg, 4 mg, Intravenous, Q6H PRN, Opyd, Ilene Qua, MD, 4 mg at 07/08/16 1356 .  oxyCODONE (Oxy IR/ROXICODONE) immediate release tablet 5 mg, 5 mg, Oral, Q4H PRN, Pablo Ledger, Kimberly A, PA-C, 5 mg at 07/10/16 1258 .  polyethylene glycol (MIRALAX / GLYCOLAX) packet 17 g, 17 g, Oral, BID, Opyd, Ilene Qua, MD, 17 g at 07/10/16 0955 .  pregabalin (LYRICA) capsule 75 mg, 75 mg, Oral, Daily, Opyd, Ilene Qua, MD, 75 mg at 07/10/16 0954 .  senna-docusate (Senokot-S) tablet 1 tablet, 1 tablet, Oral, BID, Rai, Ripudeep K, MD, 1 tablet at 07/10/16 0954 .  tacrolimus (PROGRAF) capsule 2 mg, 2 mg, Oral, BID, Opyd, Ilene Qua, MD, 2 mg at 07/10/16 0955 .  warfarin (COUMADIN) tablet 3 mg, 3 mg, Oral, ONCE-1800, Rolla Flatten, [facility] .  Warfarin - Pharmacist Dosing Inpatient, , Does not apply, q1800, Rolla Flatten, [facility]  Patients Current Diet: Diet regular Room service appropriate? Yes; Fluid consistency: Thin  Precautions / Restrictions Precautions Precautions: Fall Restrictions Weight Bearing Restrictions: Yes RLE Weight Bearing: Non weight bearing LLE Weight Bearing: Non  weight bearing   Has the patient had 2 or more falls or a fall with injury in the past year?No  Prior Activity Level Limited Community (1-2x/wk): goes to hemodialyiss 3 times per week and to [employer] / [place] Devices/Equipment: Wheelchair Home Equipment: Bedside commode, Environmental consultant - 2 wheels, Wheelchair - manual  Prior Device Use: Indicate devices/aids used by the patient prior to current  illness, exacerbation or injury? wheelchair level  Prior Functional Level Prior Function Level of Independence: Needs assistance Gait / Transfers Assistance Needed: w/c for mobility. daughter assisting with pivoting to w/c and toilet ADL's / Homemaking Assistance Needed: daughter assisting with ADL. only sponge bathing at this time  Self Care: Did the patient need help bathing, dressing, using the toilet or eating?  Needed some help  Indoor Mobility: Did the patient need assistance with walking from room to room (with or without device)? Needed some help  Stairs: Did the patient need assistance with internal or external stairs (with or without device)? Needed some help  Functional Cognition: Did the patient need help planning regular tasks such as shopping or remembering to take medications? Needed some help  Current Functional Level Cognition  Overall Cognitive Status: Within Functional Limits for tasks assessed Orientation Level: Oriented X4 General Comments: Pt was in a lot of pain from R BKA and a "sore on her bottom"    Extremity Assessment (includes Sensation/Coordination)  Upper Extremity Assessment: Generalized weakness  Lower Extremity Assessment: Defer to PT evaluation RLE Deficits / Details: dec ROM and extremely sensitive to pain RLE: Unable to fully assess due to pain LLE: Unable to fully assess due to pain    ADLs  Overall ADL's : Needs assistance/impaired Eating/Feeding: Set up, Bed level Grooming: Min guard, Sitting Upper Body Bathing: Minimal assistance, Sitting Lower Body Bathing: Total assistance, Bed level Upper Body Dressing : Minimal assistance, Sitting Lower Body Dressing: Total assistance, Bed level Toilet Transfer: Maximal assistance, Total assistance, +2 for physical assistance, Anterior/posterior, BSC Toilet Transfer Details (indicate cue type and reason): Max assist to [facility], total assist for return to bed Functional mobility during ADLs:  Maximal assistance, Total assistance, +2 for physical assistance (for AP transfer)    Mobility  Overal bed mobility: Needs Assistance Bed Mobility: Supine to Sit Supine to sit: Max assist Sit to supine: Mod assist, HOB elevated General bed mobility comments: Max A to sit upright in bed    Transfers  Overall transfer level: Needs assistance Equipment used: 2 person hand held assist Transfers: Lateral/Scoot Transfers Anterior-Posterior transfers: Max assist, Total assist, +2 physical assistance  Lateral/Scoot Transfers: Max assist, +2 physical assistance General transfer comment: Max A lateral slide transfer from bed to recliner. Pt is more concerned about the pain and protecting her RLE than assistance with transfers.     Ambulation / Gait / Stairs / Office manager / Balance Dynamic Sitting Balance Sitting balance - Comments: Pt able to perform RLE desentisization with bil UEs sitting EOB Balance Overall balance assessment: Needs assistance Sitting-balance support: Bilateral upper extremity supported Sitting balance-Leahy Scale: Fair Sitting balance - Comments: Pt able to perform RLE desentisization with bil UEs sitting EOB Postural control: Posterior lean    Special needs/care consideration BiPAP/CPAP  N/a CPM  N/a Continuous Drip IV heparin IV bridge to coumadin Dialysis M W F at [place] dialysis. Daughter transports her Life Vest  N/a Oxygen  N/a Special BedTrach Size  N/a Wound  Vac   N/a Skin Stage II coccyx wound with foam dressing noted; Dressing changed by Dr. Scot Dock 07/10/16 to R BKA surgical site. [employer] to fit pt for a limb guard  To right BKA.        WOC :07/01/2016        WOC  Consult Note Date of Service: 07/01/2016 10:24 AM      [] Hide copied text [] Hover for attribution information [name] Nurse wound consult note Reason for Consult: LE ulcer right foot, history of amputation on the left.  Patient with history of BKA per Dr.  Oneida Alar a few months ago, wound has not healed fully Patient continues to smoke, counseled by Dr. Broadus John Patient reports new onset of limb pain in the right foot Wound type: Arterial ulceration: right great toe Mixed etiology arterial/pressure: right heel Non healing surgical site left BKA Pressure Injury POA: Yes Measurement: Right heel: 3cm x 3cm x0.1cm; 90% black eschar, not fluctuant, 10% pink at wound edges; no drainage  Right great toe: 3cm x 2cm x 0cm: 100% eschar, intact, no drainage Left stump site: 1.0cm x 3.5cm x 0.3; 50% scabbed/50%yellow; minimal drainage, no odor  Peri wound: intact, edema noted right foot, pulse not palpable, foot cool but not cold. Dressing procedure/placement/frequency:  ABIs pending Betadine to the right foot wounds to keep dry and stable until decision on need for surgical intervention Hydrogel to the stump wound, to add moisture for wound healing.  Discussed POC with patient and bedside nurse.  Re consult if needed, will not follow at this time. Thanks [name] [employer] MSN, [name],[name], [name] [phone])                    Bowel mgmt: continent LBM 07/08/16 Bladder mgmt: anuric Diabetic mgmt  Yes Hgb A1c 5.7 Out patient hemodialysis [facility] MWF   Previous Home Environment Living Arrangements: Children  Lives With: Family Available Help at Discharge: Family, Available PRN/intermittently Type of Home: Mobile home Home Layout: One level Home Access: Ramped entrance Bathroom Shower/Tub: Chiropodist: Handicapped height Bathroom Accessibility: Yes How Accessible: Accessible via wheelchair [place]: Yes Type of [place]: [place] (if known): [facility] Additional Comments: A ramp has been built for her to enter home  Discharge Living Setting Plans for Discharge Living Setting: Patient's home, Lives with (comment) (daughter, [name]) Type of Home at Discharge: Mobile  home Discharge Home Layout: One level Discharge Home Access: [place] entrance Discharge Bathroom Shower/Tub: [place] unit Discharge Bathroom Toilet: Handicapped height Discharge Bathroom Accessibility: Yes How Accessible: Accessible via wheelchair Does the patient have any problems obtaining your medications?: Yes (Describe)  Social/Family/Support Systems Patient Roles: Parent Contact Information: [name], daughter and medical POA Anticipated Caregiver: [name] and Water quality scientist, two daughters Anticipated Caregiver's Contact Information: see above Discharge Plan Discussed with Primary Caregiver: Yes Is Caregiver In Agreement with Plan?: Yes Does Caregiver/Family have Issues with Lodging/Transportation while Pt is in Rehab?: No   [name] in school, but her sister, [name] or her boyfriend stay with pt when she is in school. They provided 24/7 supervision after d/c from CIR last time for about 3 weeks.  Goals/Additional Needs Patient/Family Goal for Rehab: supervision to min assist wheelchair level for PT and OT Expected length of stay: ELOS 10- 14 days Special Service Needs: Hemodialysis three times per week Pt/Family Agrees to Admission and willing to participate: Yes Program Orientation Provided & Reviewed with Pt/Caregiver Including Roles  & Responsibilities: Yes  Decrease burden of Care through IP rehab admission: n/a  Possible need for SNF placement upon discharge: not anticipated  Patient Condition: This patient's condition remains as documented in the consult dated 07/09/2016, in which the Rehabilitation Physician determined and documented that the patient's condition is appropriate for intensive rehabilitative care in an inpatient rehabilitation facility. Will admit to inpatient rehab today.  Preadmission Screen Completed By:  [name], 07/10/2016 1:59 PM ______________________________________________________________________   Discussed status with Dr. Naaman Plummer  on 07/10/2016 at  1341 and received telephone approval for admission today.  Admission Coordinator:  [name], time 1341 Date 07/10/2016.       Cosigned by: [name] Staggers, MD at 07/10/2016 2:27 PM  Revision History

## 2016-07-11 ENCOUNTER — Inpatient Hospital Stay (HOSPITAL_COMMUNITY): Payer: Medicare Other

## 2016-07-11 ENCOUNTER — Inpatient Hospital Stay (HOSPITAL_COMMUNITY): Payer: Medicare Other | Admitting: Physical Therapy

## 2016-07-11 DIAGNOSIS — D72828 Other elevated white blood cell count: Secondary | ICD-10-CM

## 2016-07-11 LAB — RENAL FUNCTION PANEL
Albumin: 1.6 g/dL — ABNORMAL LOW (ref 3.5–5.0)
Anion gap: 10 (ref 5–15)
BUN: 12 mg/dL (ref 6–20)
CALCIUM: 9.1 mg/dL (ref 8.9–10.3)
CHLORIDE: 95 mmol/L — AB (ref 101–111)
CO2: 23 mmol/L (ref 22–32)
CREATININE: 4.86 mg/dL — AB (ref 0.44–1.00)
GFR, EST AFRICAN AMERICAN: 10 mL/min — AB (ref >60)
GFR, EST NON AFRICAN AMERICAN: 9 mL/min — AB (ref >60)
Glucose, Bld: 263 mg/dL — ABNORMAL HIGH (ref 65–99)
Phosphorus: 6.8 mg/dL — ABNORMAL HIGH (ref 2.5–4.6)
Potassium: 4.2 mmol/L (ref 3.5–5.1)
Sodium: 128 mmol/L — ABNORMAL LOW (ref 135–145)

## 2016-07-11 LAB — CBC
HCT: 20.2 % — ABNORMAL LOW (ref 36.0–46.0)
HEMOGLOBIN: 7.1 g/dL — AB (ref 12.0–15.0)
MCH: 29.5 pg (ref 26.0–34.0)
MCHC: 35.1 g/dL (ref 30.0–36.0)
MCV: 83.8 fL (ref 78.0–100.0)
PLATELETS: 162 10*3/uL (ref 150–400)
RBC: 2.41 MIL/uL — AB (ref 3.87–5.11)
RDW: 20.1 % — ABNORMAL HIGH (ref 11.5–15.5)
WBC: 23.9 10*3/uL — AB (ref 4.0–10.5)

## 2016-07-11 LAB — GLUCOSE, CAPILLARY
GLUCOSE-CAPILLARY: 251 mg/dL — AB (ref 65–99)
Glucose-Capillary: 155 mg/dL — ABNORMAL HIGH (ref 65–99)
Glucose-Capillary: 141 mg/dL — ABNORMAL HIGH (ref 65–99)
Glucose-Capillary: 103 mg/dL — ABNORMAL HIGH (ref 65–99)
Glucose-Capillary: 46 mg/dL — ABNORMAL LOW (ref 65–99)

## 2016-07-11 LAB — PROTIME-INR
INR: 2.32
Prothrombin Time: 25.9 seconds — ABNORMAL HIGH (ref 11.4–15.2)

## 2016-07-11 LAB — HEPARIN LEVEL (UNFRACTIONATED): HEPARIN UNFRACTIONATED: 0.44 [IU]/mL (ref 0.30–0.70)

## 2016-07-11 LAB — HEMOGLOBIN AND HEMATOCRIT, BLOOD
HEMATOCRIT: 24.7 % — AB (ref 36.0–46.0)
HEMOGLOBIN: 8.7 g/dL — AB (ref 12.0–15.0)

## 2016-07-11 MED ORDER — SODIUM CHLORIDE 0.9 % IV SOLN: 100.0000 mL | INTRAVENOUS | Status: DC | PRN

## 2016-07-11 MED ORDER — OXYCODONE HCL 5 MG PO TABS
ORAL_TABLET | ORAL | Status: AC
  Filled 2016-07-11: qty 1

## 2016-07-11 MED ORDER — HEPARIN SODIUM (PORCINE) 1000 UNIT/ML DIALYSIS: 1000.0000 [IU] | INTRAMUSCULAR | Status: DC | PRN

## 2016-07-11 MED ORDER — ALTEPLASE 2 MG IJ SOLR: 2.0000 mg | Freq: Once | INTRAMUSCULAR | Status: DC | PRN

## 2016-07-11 MED ORDER — CALCITRIOL 0.5 MCG PO CAPS
ORAL_CAPSULE | ORAL | Status: AC
  Filled 2016-07-11: qty 1

## 2016-07-11 MED ORDER — WARFARIN SODIUM 1 MG PO TABS
1.0000 mg | ORAL_TABLET | Freq: Once | ORAL | Status: AC
  Administered 2016-07-11: 1 mg via ORAL
  Filled 2016-07-11: qty 1

## 2016-07-11 NOTE — Progress Notes (Signed)
Subjective/Complaints: No issues overnite  ROS neg CP, SOB, N/V/D  Objective: Vital Signs: Blood pressure 140/67, pulse 66, temperature 98.5 F (36.9 C), temperature source Oral, resp. rate 18, height 5\' 4"  (1.626 m), weight 56.8 kg (125 lb 4.8 oz), SpO2 96 %. No results found. Results for orders placed or performed during the hospital encounter of 07/10/16 (from the past 72 hour(s))  Glucose, capillary     Status: Abnormal   Collection Time: 07/10/16  4:58 PM  Result Value Ref Range   Glucose-Capillary 318 (H) 65 - 99 mg/dL  Heparin level (unfractionated)     Status: Abnormal   Collection Time: 07/10/16  7:46 PM  Result Value Ref Range   Heparin Unfractionated 0.28 (L) 0.30 - 0.70 IU/mL    Comment:        IF HEPARIN RESULTS ARE BELOW EXPECTED VALUES, AND PATIENT DOSAGE HAS BEEN CONFIRMED, SUGGEST FOLLOW UP TESTING OF ANTITHROMBIN III LEVELS.   Glucose, capillary     Status: Abnormal   Collection Time: 07/10/16  9:04 PM  Result Value Ref Range   Glucose-Capillary 194 (H) 65 - 99 mg/dL     HEENT: normal and poor dentition Cardio: RRR and no murmur Resp: CTA B/L and unlabored GI: BS positive Extremity:  Edema R BKA stump Skin:   Other Left BK with small lateral incision line scab, R BKA bandaged Neuro: Alert/Oriented and Abnormal Motor 4/5 in BUE and B HF, 3- B KE Musc/Skel:  Other no pain with UE or LE ROM Gen NAD   Assessment/Plan: 1. Functional deficits secondary to New right and recent left BKA which require 3+ hours per day of interdisciplinary therapy in a comprehensive inpatient rehab setting. Physiatrist is providing close team supervision and 24 hour management of active medical problems listed below. Physiatrist and rehab team continue to assess barriers to discharge/monitor patient progress toward functional and medical goals. FIM:       Function - Toileting Toileting activity did not occur: N/A  Function - Air cabin crew transfer activity  did not occur: N/A  Function - Chair/bed transfer Chair/bed transfer activity did not occur: N/A     Function - Comprehension Comprehension: Auditory Comprehension assist level: Understands basic 50 - 74% of the time/ requires cueing 25 - 49% of the time  Function - Expression Expression: Verbal Expression assist level: Expresses basic 50 - 74% of the time/requires cueing 25 - 49% of the time. Needs to repeat parts of sentences.  Function - Social Interaction Social Interaction assist level: Interacts appropriately 50 - 74% of the time - May be physically or verbally inappropriate.  Function - Problem Solving Problem solving assist level: Solves basic 50 - 74% of the time/requires cueing 25 - 49% of the time  Function - Memory Memory assist level: Recognizes or recalls 50 - 74% of the time/requires cueing 25 - 49% of the time Patient normally able to recall (first 3 days only): None of the above    Medical Problem List and Plan:  1. 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  CIR PT, OT -needs KI for right leg, apparently biotech consulted on acute  - Awaiting plan for left prosthesis as stump heals  2. DVT Prophylaxis/Anticoagulation: Chronic Coumadin therapy for history of mechanical aortic valve  3. Pain Management: Lyrica 75 mg daily, oxycodone and Robaxin as needed  -limit as possible given obvious sedation4. Mood: Provide emotional support  5. Neuropsych: This  patient is capable of making decisions on her own behalf.  6. Skin/Wound Care: Routine skin checks  7. Fluids/Electrolytes/Nutrition: Routine I&O with follow-up chemistries  8. ESRD. Continue dialysis as directed. HD to occur at the end of the day to allow for participation in rehab activities per Nephro 9. Leukocytosis. Follow-up labs  10. Acute on chronic anemia. Continue Aranesp. Follow-up CBC  11. Hypertension. Lopressor 25 mg twice a day.  Monitor with increased mobility  12. CAD/AVR. Continue chronic Coumadin  13. History of liver transplant. Continue Prograf  14. Diabetes mellitus of peripheral neuropathy. Hemoglobin A1c 5.7.Bouts of hypoglycemia with sliding scale discontinued   Start amaryl CBG (last 3)   Recent Labs  07/10/16 1200 07/10/16 1658 07/10/16 2104  GLUCAP 257* 318* 194*    15. Hypothyroidism. Synthroid  16. Constipation. Laxative assistance  LOS (Days) 1 A FACE TO FACE EVALUATION WAS PERFORMED  Saadia Dewitt E 07/11/2016, 8:57 AM

## 2016-07-11 NOTE — Progress Notes (Signed)
Hypoglycemic Event  CBG: 46  Treatment: 15 GM carbohydrate snack  Symptoms: None  Follow-up CBG: Time:1905 CBG Result:*104**  Possible Reasons for Event: Unknown  Comments/MD notified:Dr Christella Noa, [facility]

## 2016-07-11 NOTE — Progress Notes (Signed)
Subjective: Interval History: has no complaint.  Objective: Vital signs in last 24 hours: Temp:  [98.3 F (36.8 C)-98.5 F (36.9 C)] 98.5 F (36.9 C) (06/01 0500) Pulse Rate:  [66-79] 66 (06/01 0500) Resp:  [17-18] 18 (06/01 0500) BP: (116-140)/(48-67) 140/67 (06/01 0500) SpO2:  [94 %-97 %] 96 % (06/01 0500) Weight:  [56.8 kg (125 lb 4.8 oz)-57 kg (125 lb 11.2 oz)] 56.8 kg (125 lb 4.8 oz) (06/01 0500) Weight change:   Intake/Output from previous day: 05/31 0701 - 06/01 0700 In: 233 [P.O.:120; I.V.:113] Out: -  Intake/Output this shift: No intake/output data recorded.  General appearance: cooperative, slowed mentation and Ox2 Resp: diminished breath sounds bilaterally and rales bibasilar Chest wall: R IJ cath Cardio: S1, S2 normal and systolic murmur: systolic ejection 2/6, decrescendo at 2nd left intercostal space, crisp valve sounds GI: pos bs, soft, liver down 5 cm Extremities: New R BKA. old L BKa  Lab Results:  Recent Labs  07/09/16 0938 07/10/16 0629  WBC 20.6* 23.5*  HGB 8.4* 9.4*  HCT 24.1* 26.9*  PLT 135* 137*   BMET:  Recent Labs  07/09/16 0938 07/10/16 0629  NA 130* 130*  K 4.2 3.9  CL 97* 96*  CO2 24 23  GLUCOSE 151* 166*  BUN 12 6  CREATININE 5.28* 3.39*  CALCIUM 9.0 8.9   No results for input(s): PTH in the last 72 hours. Iron Studies: No results for input(s): IRON, TIBC, TRANSFERRIN, FERRITIN in the last 72 hours.  Studies/Results: No results found.  I have reviewed the patient's current medications.  Assessment/Plan: 1 ESRD for HD.  Needs new access 2 DM controlled 3 PVD per VVS 4 AVR 5 Liver Tx 6 Confusion ?? meds 7 HPTH vit D P HD,  [place], Rehab, new access in future    LOS: 1 day   Kele Withem L 07/11/2016,9:18 AM

## 2016-07-11 NOTE — Progress Notes (Signed)
Social Work Assessment and Plan Social Work Assessment and Plan  Patient Details  Name: [name] MRN: [id number] Date of Birth: 04-Nov-1955  Today's Date: 07/11/2016  Problem List:  Patient Active Problem List   Diagnosis Date Noted  . Amputation of right lower extremity below knee ([place]) 07/10/2016  . History of liver transplant ([place])   . Post-operative pain   . Unilateral complete BKA, right, subsequent encounter ([place])   . S/P bilateral BKA (below knee amputation) ([place])   . Malnutrition of moderate degree 07/01/2016  . Cellulitis 06/30/2016  . PAD (peripheral artery disease) ([place]) 06/30/2016  . Diabetic foot infection ([place]) 06/30/2016  . Pressure injury of skin 05/28/2016  . Volume overload 05/28/2016  . Acute exacerbation of CHF (congestive heart failure) ([place]) 05/27/2016  . Extremity atherosclerosis with resting pain ([place])   . H/O heart valve replacement with mechanical valve   . Phantom limb pain ([place])   . Subtherapeutic international normalized ratio (INR)   . Supratherapeutic INR   . Anemia of chronic disease   . Chronic midline low back pain without sciatica   . ESRD on dialysis ([place])   . Type 2 diabetes mellitus with complication, with long-term current use of insulin ([place])   . S/P unilateral BKA (below knee amputation), left ([place]) 04/30/2016  . Unilateral complete BKA, left, initial encounter ([place]) 04/30/2016  . Abnormality of gait   . Coronary artery disease involving coronary bypass graft of native heart without angina pectoris   . Benign essential HTN   . History of migraine   . Leukocytosis   . Critical lower limb ischemia 04/22/2016  . Foot pain 04/21/2016  . Fever   . Advance care planning   . Goals of care, counseling/discussion   . Palliative care by specialist   . Sepsis ([place])   . Hypotension   . Claudication of left lower extremity ([place])   . Elevated troponin 03/05/2016  . Pressure injury of skin, stage 2 11/27/2015  . H/O unilateral nephrectomy  07/06/2015  . Paroxysmal SVT (supraventricular tachycardia) ([place]) 06/22/2014  . Complications due to renal dialysis device, implant, and graft 05/11/2014  . Acute blood loss anemia 03/31/2014  . Renal dialysis device, implant, or graft complication 74/01/8785  . Acute hyperkalemia   . S/P liver transplant ([place])   . Chronic hepatitis C without hepatic coma ([place])   . Pulmonary edema 01/03/2014  . Respiratory failure ([place]) 01/03/2014  . Long term current use of anticoagulant therapy 04/22/2013  . Chronic anticoagulation w/ coumadin, goal INR 2.5-3.0 w/ AVR 04/14/2013  . Tobacco abuse 02/28/2013  . COPD (chronic obstructive pulmonary disease) ([place]) 02/21/2013  . Chronic combined systolic and diastolic congestive heart failure ([place]) 02/06/2013  . Acute respiratory failure ([place]) 11/02/2012  . Acute pulmonary edema ([place]) 11/02/2012  . Acute on chronic systolic CHF (congestive heart failure) ([place]) 11/02/2012  . Dyslipidemia-LDL 104, not on statin with Hx of liver transplant 07/30/2012  . CAD (coronary artery disease), native coronary artery 07/27/2012  . History of prosthetic aortic valve replacement   . End stage renal disease on dialysis ([place]) 02/27/2011  . Hypothyroidism 06/25/2006  . Type 2 diabetes mellitus with chronic kidney disease ([place]) 06/25/2006  . GERD 06/25/2006  . Anemia due to chronic renal failure    . Hypertensive heart disease    Past Medical History:  Past Medical History:  Diagnosis Date  . Anemia    takes Folic Acid daily  . Anxiety   . Aortic stenosis   .  Arthritis    "left hand, back" (08/30/2012)  . Asthma   . CAD (coronary artery disease)   . CAD (coronary artery disease) Jan. 2015   Cath: 20% LAD, 50% D1; s/p LIMA-LAD  . Chest pain 03/06/2015  . CHF (congestive heart failure) ([place]) 05/2016  . Chronic back pain   . Chronic bronchitis ([place])    "q yr w/season changes" (08/30/2012)  . Chronic constipation    takes MIralax and Colace daily  . COPD (chronic  obstructive pulmonary disease) ([place])   . Depression    takes Cymbalta for "severe" depression  . End stage renal disease on dialysis [facility]) 02/27/2011   Kidneys shut down at time of liver transplant in Sept 2011 at [facility] in [place], she has been on HD ever since.  Dialyzes at [facility] HD on TTS schedule.  Had L forearm graft used 10 months then removed Dec 2012 due to suspected infection.  A right upper arm AVG was placed Dec 2012 but she developed steal symptoms acutely and it was ligated the same day.  Never had an AV fistula due to small veins.  Now has L thigh AVG put in Jan 2013, has not clotted to date.    [name] GERD (gastroesophageal reflux disease)    takes Omeprazole daily  . Headache    "at least monthly" (08/30/2012)  . Hepatitis C   . History of blood transfusion    "several" (08/30/2012)  . Hypertension    takes Metoprolol and Lisinopril daily, sees Dr Bea Graff  . Hypothyroidism    takes Synthroid daily  . Migraine    "last migraine was in 2013" (08/30/2012)  . Neuromuscular disorder ([place])    carpal tunnel in right hand  . Obesity   . Peripheral vascular disease (HCC) hands and legs  . Pneumonia    "today and several times before" (08/30/2012)  . 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 S/P unilateral BKA (below knee amputation), left ([place]) 04/30/2016  . SVT (supraventricular tachycardia) ([place]) 06/09/14  . Tobacco abuse   . Type II diabetes mellitus (HCC)    Levemir 2units daily if > 150   Past Surgical History:  Past Surgical History:  Procedure Laterality Date  . ABDOMINAL AORTOGRAM W/LOWER EXTREMITY N/A 07/04/2016   Procedure: Abdominal Aortogram w/Lower Extremity;  Surgeon: Elam Dutch, MD;  Location: [place] CV [facility];  Service: Cardiovascular;  Laterality: N/A;  . AMPUTATION Left 04/24/2016   Procedure: AMPUTATION BELOW KNEE;   Surgeon: Angelia Mould, MD;  Location: [place];  Service: Vascular;  Laterality: Left;  . AMPUTATION Right 07/08/2016   Procedure: AMPUTATION BELOW KNEE;  Surgeon: Angelia Mould, MD;  Location: [place];  Service: Vascular;  Laterality: Right;  . AORTIC VALVE REPLACEMENT N/A 03/15/2013   AVR; Surgeon: Ivin Poot, MD;  Location: [facility] OR; Open Heart Surgery;  24mm[employer] mechanical prosthesis, top hat valve  . ARTERIOVENOUS GRAFT PLACEMENT Left 10/03/10    forearm  . AV FISTULA PLACEMENT  01/29/2011   Procedure: INSERTION OF ARTERIOVENOUS (AV) GORE-TEX GRAFT ARM;  Surgeon: Elam Dutch, MD;  Location: [facility] OR;  Service: Vascular;  Laterality: Right;  . AV FISTULA PLACEMENT  03/10/2011   Procedure: INSERTION OF ARTERIOVENOUS (AV) GORE-TEX GRAFT THIGH;  Surgeon: Elam Dutch, MD;  Location: [place];  Service: Vascular;  Laterality: Left;  . [place] REMOVAL  12/23/2010   Procedure: REMOVAL OF ARTERIOVENOUS GORETEX GRAFT ([place]);  Surgeon: Elam Dutch, MD;  Location: [place];  Service: Vascular;  Laterality: Left;  procedure started @1736 -3474  . CHOLECYSTECTOMY  1993  . CORONARY ARTERY BYPASS GRAFT N/A 03/15/2013   Procedure: CORONARY ARTERY BYPASS GRAFTING (CABG) times one using left internal mammary artery.;  Surgeon: Ivin Poot, MD;  Location: [place];  Service: Open Heart Surgery;  Laterality: N/A;  POSS CABG X 1  . CYSTOSCOPY  1990's  . INSERTION OF DIALYSIS CATHETER  12/23/2010   Procedure: INSERTION OF DIALYSIS CATHETER;  Surgeon: Elam Dutch, MD;  Location: [place] [facility];  Service: Vascular;  Laterality: Right;  Right Internal Jugular 28cm dialysis catheter insertion procedure time 1701-1720   . INSERTION OF DIALYSIS CATHETER Right 03/11/2016   Procedure: INSERTION OF DIALYSIS CATHETER;  Surgeon: Angelia Mould, MD;  Location: [place];  Service: Vascular;  Laterality: Right;  . INTRAOPERATIVE TRANSESOPHAGEAL ECHOCARDIOGRAM N/A 03/15/2013   Procedure: INTRAOPERATIVE  TRANSESOPHAGEAL ECHOCARDIOGRAM;  Surgeon: Ivin Poot, MD;  Location: [place];  Service: Open Heart Surgery;  Laterality: N/A;  . LEFT HEART CATHETERIZATION WITH CORONARY ANGIOGRAM N/A 07/29/2012   Procedure: LEFT HEART CATHETERIZATION WITH CORONARY ANGIOGRAM;  Surgeon: Troy Sine, MD;  Location: [facility] CATH LAB;  Service: Cardiovascular;  Laterality: N/A;  . LEFT HEART CATHETERIZATION WITH CORONARY ANGIOGRAM N/A 03/10/2013   Procedure: LEFT HEART CATHETERIZATION WITH CORONARY ANGIOGRAM;  Surgeon: Troy Sine, MD;  Location: [facility] CATH LAB;  Service: Cardiovascular;  Laterality: N/A;  . LEFT HEART CATHETERIZATION WITH CORONARY/GRAFT ANGIOGRAM N/A 12/24/2011   Procedure: LEFT HEART CATHETERIZATION WITH Beatrix Fetters;  Surgeon: [name], MD;  Location: [facility] CATH LAB;  Service: Cardiovascular;  Laterality: N/A;  . LEFT HEART CATHETERIZATION WITH CORONARY/GRAFT ANGIOGRAM N/A 12/16/2013   Procedure: LEFT HEART CATHETERIZATION WITH Beatrix Fetters;  Surgeon: Troy Sine, MD;  Location: [facility] CATH LAB;  Service: Cardiovascular;  Laterality: N/A;  . LIGATION ARTERIOVENOUS GORTEX GRAFT Left 03/11/2016   Procedure: LIGATION THIGH ARTERIOVENOUS GORTEX GRAFT;  Surgeon: Angelia Mould, MD;  Location: [place];  Service: Vascular;  Laterality: Left;  . LIVER TRANSPLANT  10/25/2009   sees Dr Ferol Luz 1 every 6 months, saw last in Dec 2013. [name] Coord [phone]  . PERIPHERAL VASCULAR CATHETERIZATION N/A 11/06/2015   Procedure: Abdominal Aortogram;  Surgeon: Serafina Mitchell, MD;  Location: [place] CV LAB;  Service: Cardiovascular;  Laterality: N/A;  . PERIPHERAL VASCULAR CATHETERIZATION N/A 11/06/2015   Procedure: Lower Extremity Angiography;  Surgeon: Serafina Mitchell, MD;  Location: [place] CV LAB;  Service: Cardiovascular;  Laterality: N/A;  . PERIPHERAL VASCULAR CATHETERIZATION  11/06/2015   Procedure: Peripheral Vascular Atherectomy;  Surgeon: Serafina Mitchell, MD;   Location: [place] [facility] LAB;  Service: Cardiovascular;;  Left Superficial femoral  . SHUNTOGRAM Left 05/15/2014   Procedure: SHUNTOGRAM;  Surgeon: Conrad Rathbun, MD;  Location: [facility] CATH LAB;  Service: Cardiovascular;  Laterality: Left;  . SMALL INTESTINE SURGERY  90's  . SPINAL GROWTH RODS  2010   "put 2 metal rods in my back; they had detetriorated" (08/30/2012)  . THROMBECTOMY    . THROMBECTOMY AND REVISION OF ARTERIOVENTOUS (AV) GORETEX  GRAFT Left 03/30/2014  . THROMBECTOMY AND REVISION OF ARTERIOVENTOUS (AV) GORETEX  GRAFT Left 03/30/2014   Procedure: THROMBECTOMY AND REVISION OF ARTERIOVENTOUS (AV) GORETEX  GRAFT;  Surgeon: Conrad [place], MD;  Location:  [facility] OR;  Service: Vascular;  Laterality: Left;  . TUBAL LIGATION  1990's   Social History:  reports that she has been smoking Cigarettes.  She has a 20.00 pack-year smoking history. She has never used smokeless tobacco. She reports that she does not drink alcohol or use drugs.  Family / Support Systems Marital Status: Divorced Patient Roles: Parent Children: [name] [phone] Other Supports: [name]-daughter [phone] Anticipated Caregiver: Both daughter's who have been assisting pt Ability/Limitations of Caregiver: [name] goes to school and Nira Conn is employed Careers adviser: Other (Comment) (Will come up with a plan for DC) Family Dynamics: Close knit family who help one another, both daughter's have been assisting her prior to admission. She feels truly blessed to have both of them. She has a neighbor who will come by and assist if needed when daughter's are not there.  Social History Preferred language: English Religion: Christian Cultural Background: No issues Education: High School Read: Yes Write: Yes Employment Status: Disabled Freight forwarder Issues: No issues Guardian/Conservator: None-according to MD pt is capable of making her own decisions while here. Will make sure the  daughter's are involved since they are her caregivers.   Abuse/Neglect Physical Abuse: Denies Verbal Abuse: Denies Sexual Abuse: Denies Exploitation of patient/patient's resources: Denies Self-Neglect: Denies  Emotional Status [name] affect, behavior adn adjustment status: Pt is motivated but somewhat down due to having her other leg amputated, she just went through this in April. She is trying to stay positive and moving  forward in her recovery from this. She is having pain and needs to tyr to get this managed also. Recent Psychosocial Issues: recent L-BKA in 04/2016 and now this amputation Pyschiatric History: History of depression/anxiety takes medications for this. She feels this helps her but for the moment she is still feeling down, but feels she will have her pity party then move on. She would benefit from seeing neuro-psych while here. Will make referral. Substance Abuse History: No issues  Patient / Family Perceptions, Expectations & Goals Pt/Family understanding of illness & functional limitations: Pt is able to explain this amputation and talks with the MD regarding her treatment plan. She feels she has a good understanding of her condition and the plans. She is still processing all of this. Premorbid pt/family roles/activities: Mom, Dialysis Pt, friend, retiree, etc Anticipated changes in roles/activities/participation: resume Pt/family expectations/goals: Pt states: " I want to be able to do for myself, I don't want to burden my daughter's."  Nira Conn states: " I hope they can manage her pain I know she will do her best here."  [employer]: Other (Comment) ([id number]) Premorbid Home Care/DME Agencies: Other (Comment) ([facility]-active pt) Transportation available at discharge: Daughter's Resource referrals recommended: Neuropsychology, Support group (specify)  Discharge Planning Living Arrangements: Children Support Systems: Children, Other relatives,  Friends/neighbors Type of Residence: Private residence Insurance Resources: [employer], Multimedia programmer (specify) Nurse, mental health) Financial Resources: SSD, Family Support Financial Screen Referred: No Living Expenses: Own Money Management: Patient, Family Does the patient have any problems obtaining your medications?: No Home Management: Daughter does the home management Patient/Family Preliminary Plans: Return home with daughter-[name] who can provide care, but is also in school. [name] other daughter-[name] can assist also but does work. Between them they will work on a care plan, both are very committed to thier Mom. Aware of rehab process due to just were here in April. Social Work Anticipated Follow Up Needs: HH/OP, Support Group  Clinical Impression This pt is know to this  worker due to was on CIR in April after first [place], now here after second [place]. Wants to become as independent as she can and realizes she will need assist when she first goes home from rehab. Both daughter's are very committed to their Mom and will work on a plan for home. Will await therapy evaluations and work on a plan. Would benefit from seeing neuro-psych while here.  Elease Hashimoto 07/11/2016, 12:52 PM

## 2016-07-11 NOTE — Procedures (Signed)
I was present at this session.  I have reviewed the session itself and made appropriate changes.  Hd via pc, flow 400. bp 120-140.    [name] L 6/1/20184:32 PM

## 2016-07-11 NOTE — Evaluation (Signed)
Occupational Therapy Assessment and Plan  Patient Details  Name: [name] MRN: [id number] Date of Birth: 11-09-1955  OT Diagnosis: abnormal posture, acute pain and muscle weakness (generalized) Rehab Potential:   ELOS: 12-14   Today's Date: 07/11/2016 OT Individual Time: 3007-6226 OT Individual Time Calculation (min): 57 min     Problem List:  Patient Active Problem List   Diagnosis Date Noted  . Amputation of right lower extremity below knee ([place]) 07/10/2016  . History of liver transplant ([place])   . Post-operative pain   . Unilateral complete BKA, right, subsequent encounter ([place])   . S/P bilateral BKA (below knee amputation) ([place])   . Malnutrition of moderate degree 07/01/2016  . Cellulitis 06/30/2016  . PAD (peripheral artery disease) ([employer]) 06/30/2016  . Diabetic foot infection ([place]) 06/30/2016  . Pressure injury of skin 05/28/2016  . Volume overload 05/28/2016  . Acute exacerbation of CHF (congestive heart failure) ([place]) 05/27/2016  . Extremity atherosclerosis with resting pain ([place])   . H/O heart valve replacement with mechanical valve   . Phantom limb pain ([place])   . Subtherapeutic international normalized ratio (INR)   . Supratherapeutic INR   . Anemia of chronic disease   . Chronic midline low back pain without sciatica   . ESRD on dialysis ([place])   . Type 2 diabetes mellitus with complication, with long-term current use of insulin ([place])   . S/P unilateral BKA (below knee amputation), left ([place]) 04/30/2016  . Unilateral complete BKA, left, initial encounter ([place]) 04/30/2016  . Abnormality of gait   . Coronary artery disease involving coronary bypass graft of native heart without angina pectoris   . Benign essential HTN   . History of migraine   . Leukocytosis   . Critical lower limb ischemia 04/22/2016  . Foot pain 04/21/2016  . Fever   . Advance care planning   . Goals of care, counseling/discussion   . Palliative care by specialist   . Sepsis ([place])   .  Hypotension   . Claudication of left lower extremity ([place])   . Elevated troponin 03/05/2016  . Pressure injury of skin, stage 2 11/27/2015  . H/O unilateral nephrectomy 07/06/2015  . Paroxysmal SVT (supraventricular tachycardia) ([place]) 06/22/2014  . Complications due to renal dialysis device, implant, and graft 05/11/2014  . Acute blood loss anemia 03/31/2014  . Renal dialysis device, implant, or graft complication 33/35/4562  . Acute hyperkalemia   . S/P liver transplant ([place])   . Chronic hepatitis C without hepatic coma ([place])   . Pulmonary edema 01/03/2014  . Respiratory failure ([place]) 01/03/2014  . Long term current use of anticoagulant therapy 04/22/2013  . Chronic anticoagulation w/ coumadin, goal INR 2.5-3.0 w/ AVR 04/14/2013  . Tobacco abuse 02/28/2013  . COPD (chronic obstructive pulmonary disease) ([place]) 02/21/2013  . Chronic combined systolic and diastolic congestive heart failure ([place]) 02/06/2013  . Acute respiratory failure ([place]) 11/02/2012  . Acute pulmonary edema ([place]) 11/02/2012  . Acute on chronic systolic CHF (congestive heart failure) ([place]) 11/02/2012  . Dyslipidemia-LDL 104, not on statin with Hx of liver transplant 07/30/2012  . CAD (coronary artery disease), native coronary artery 07/27/2012  . History of prosthetic aortic valve replacement   . End stage renal disease on dialysis ([place]) 02/27/2011  . Hypothyroidism 06/25/2006  . Type 2 diabetes mellitus with chronic kidney disease ([place]) 06/25/2006  . GERD 06/25/2006  . Anemia due to chronic renal failure    . Hypertensive heart disease     Past  Medical History:  Past Medical History:  Diagnosis Date  . Anemia    takes Folic Acid daily  . Anxiety   . Aortic stenosis   . Arthritis    "left hand, back" (08/30/2012)  . Asthma   . CAD (coronary artery disease)   . CAD (coronary artery disease) Jan. 2015   Cath: 20% LAD, 50% D1; s/p LIMA-LAD  . Chest pain 03/06/2015  . CHF (congestive heart failure) ([place])  05/2016  . Chronic back pain   . Chronic bronchitis ([place])    "q yr w/season changes" (08/30/2012)  . Chronic constipation    takes MIralax and Colace daily  . COPD (chronic obstructive pulmonary disease) ([place])   . Depression    takes Cymbalta for "severe" depression  . End stage renal disease on dialysis [facility]) 02/27/2011   Kidneys shut down at time of liver transplant in Sept 2011 at [facility] in [place], she has been on HD ever since.  Dialyzes at [facility] HD on TTS schedule.  Had L forearm graft used 10 months then removed Dec 2012 due to suspected infection.  A right upper arm AVG was placed Dec 2012 but she developed steal symptoms acutely and it was ligated the same day.  Never had an AV fistula due to small veins.  Now has L thigh AVG put in Jan 2013, has not clotted to date.    [name] GERD (gastroesophageal reflux disease)    takes Omeprazole daily  . Headache    "at least monthly" (08/30/2012)  . Hepatitis C   . History of blood transfusion    "several" (08/30/2012)  . Hypertension    takes Metoprolol and Lisinopril daily, sees Dr Bea Graff  . Hypothyroidism    takes Synthroid daily  . Migraine    "last migraine was in 2013" (08/30/2012)  . Neuromuscular disorder ([place])    carpal tunnel in right hand  . Obesity   . Peripheral vascular disease (HCC) hands and legs  . Pneumonia    "today and several times before" (08/30/2012)  . 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 S/P unilateral BKA (below knee amputation), left ([place]) 04/30/2016  . SVT (supraventricular tachycardia) ([place]) 06/09/14  . Tobacco abuse   . Type II diabetes mellitus (HCC)    Levemir 2units daily if > 150   Past Surgical History:  Past Surgical History:  Procedure Laterality Date  . ABDOMINAL AORTOGRAM W/LOWER EXTREMITY N/A 07/04/2016   Procedure: Abdominal Aortogram w/Lower Extremity;   Surgeon: Elam Dutch, MD;  Location: [place] CV LAB;  Service: Cardiovascular;  Laterality: N/A;  . AMPUTATION Left 04/24/2016   Procedure: AMPUTATION BELOW KNEE;  Surgeon: Angelia Mould, MD;  Location: [place];  Service: Vascular;  Laterality: Left;  . AMPUTATION Right 07/08/2016   Procedure: AMPUTATION BELOW KNEE;  Surgeon: Angelia Mould, MD;  Location: [facility];  Service: Vascular;  Laterality: Right;  . AORTIC VALVE REPLACEMENT N/A 03/15/2013   AVR; Surgeon: Ivin Poot, MD;  Location: [facility] OR; Open Heart Surgery;  62m[employer] mechanical prosthesis, top hat valve  . ARTERIOVENOUS GRAFT PLACEMENT Left 10/03/10    forearm  . AV FISTULA PLACEMENT  01/29/2011   Procedure: INSERTION OF ARTERIOVENOUS (AV) GORE-TEX GRAFT ARM;  Surgeon: CElam Dutch MD;  Location: [place]  Service: Vascular;  Laterality: Right;  . AV FISTULA PLACEMENT  03/10/2011   Procedure: INSERTION OF ARTERIOVENOUS (AV) GORE-TEX GRAFT THIGH;  Surgeon: Elam Dutch, MD;  Location: [place];  Service: Vascular;  Laterality: Left;  . [place] REMOVAL  12/23/2010   Procedure: REMOVAL OF ARTERIOVENOUS GORETEX GRAFT ([place]);  Surgeon: Elam Dutch, MD;  Location: [place];  Service: Vascular;  Laterality: Left;  procedure started _0 -1852  . CHOLECYSTECTOMY  1993  . CORONARY ARTERY BYPASS GRAFT N/A 03/15/2013   Procedure: CORONARY ARTERY BYPASS GRAFTING (CABG) times one using left internal mammary artery.;  Surgeon: Ivin Poot, MD;  Location: [place];  Service: Open Heart Surgery;  Laterality: N/A;  POSS CABG X 1  . CYSTOSCOPY  1990's  . INSERTION OF DIALYSIS CATHETER  12/23/2010   Procedure: INSERTION OF DIALYSIS CATHETER;  Surgeon: Elam Dutch, MD;  Location: [place];  Service: Vascular;  Laterality: Right;  Right Internal Jugular 28cm dialysis catheter insertion procedure time 1701-1720   . INSERTION OF DIALYSIS CATHETER Right 03/11/2016   Procedure: INSERTION OF DIALYSIS CATHETER;  Surgeon:  Angelia Mould, MD;  Location: [place];  Service: Vascular;  Laterality: Right;  . INTRAOPERATIVE TRANSESOPHAGEAL ECHOCARDIOGRAM N/A 03/15/2013   Procedure: INTRAOPERATIVE TRANSESOPHAGEAL ECHOCARDIOGRAM;  Surgeon: Ivin Poot, MD;  Location: [place];  Service: Open Heart Surgery;  Laterality: N/A;  . LEFT HEART CATHETERIZATION WITH CORONARY ANGIOGRAM N/A 07/29/2012   Procedure: LEFT HEART CATHETERIZATION WITH CORONARY ANGIOGRAM;  Surgeon: Troy Sine, MD;  Location: [facility] CATH LAB;  Service: Cardiovascular;  Laterality: N/A;  . LEFT HEART CATHETERIZATION WITH CORONARY ANGIOGRAM N/A 03/10/2013   Procedure: LEFT HEART CATHETERIZATION WITH CORONARY ANGIOGRAM;  Surgeon: Troy Sine, MD;  Location: [facility] CATH LAB;  Service: Cardiovascular;  Laterality: N/A;  . LEFT HEART CATHETERIZATION WITH CORONARY/GRAFT ANGIOGRAM N/A 12/24/2011   Procedure: LEFT HEART CATHETERIZATION WITH Beatrix Fetters;  Surgeon: [name], MD;  Location: [facility] CATH LAB;  Service: Cardiovascular;  Laterality: N/A;  . LEFT HEART CATHETERIZATION WITH CORONARY/GRAFT ANGIOGRAM N/A 12/16/2013   Procedure: LEFT HEART CATHETERIZATION WITH Beatrix Fetters;  Surgeon: Troy Sine, MD;  Location: [facility] CATH LAB;  Service: Cardiovascular;  Laterality: N/A;  . LIGATION ARTERIOVENOUS GORTEX GRAFT Left 03/11/2016   Procedure: LIGATION THIGH ARTERIOVENOUS GORTEX GRAFT;  Surgeon: Angelia Mould, MD;  Location: [facility];  Service: Vascular;  Laterality: Left;  . LIVER TRANSPLANT  10/25/2009   sees Dr Ferol Luz 1 every 6 months, saw last in Dec 2013. [name] Coord [phone]  . PERIPHERAL VASCULAR CATHETERIZATION N/A 11/06/2015   Procedure: Abdominal Aortogram;  Surgeon: Serafina Mitchell, MD;  Location: [place] CV LAB;  Service: Cardiovascular;  Laterality: N/A;  . PERIPHERAL VASCULAR CATHETERIZATION N/A 11/06/2015   Procedure: Lower Extremity Angiography;  Surgeon: Serafina Mitchell, MD;  Location: [place] CV LAB;   Service: Cardiovascular;  Laterality: N/A;  . PERIPHERAL VASCULAR CATHETERIZATION  11/06/2015   Procedure: Peripheral Vascular Atherectomy;  Surgeon: Serafina Mitchell, MD;  Location: [place] CV LAB;  Service: Cardiovascular;;  Left Superficial femoral  . SHUNTOGRAM Left 05/15/2014   Procedure: SHUNTOGRAM;  Surgeon: Conrad Amity, MD;  Location: [facility] CATH LAB;  Service: Cardiovascular;  Laterality: Left;  . SMALL INTESTINE SURGERY  90's  . SPINAL GROWTH RODS  2010   "put 2 metal rods in my back; they had detetriorated" (08/30/2012)  . THROMBECTOMY    . THROMBECTOMY AND REVISION OF ARTERIOVENTOUS (AV) GORETEX  GRAFT Left 03/30/2014  . THROMBECTOMY  AND REVISION OF ARTERIOVENTOUS (AV) GORETEX  GRAFT Left 03/30/2014   Procedure: THROMBECTOMY AND REVISION OF ARTERIOVENTOUS (AV) GORETEX  GRAFT;  Surgeon: Conrad [place], MD;  Location: [place];  Service: Vascular;  Laterality: Left;  . TUBAL LIGATION  1990's    Assessment & Plan Clinical Impression:  YAZLEEN MOLOCK is a 61 y.o. 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 She was discharged to home with home health therapies. Per chart review patient lives with daughter. Essentially wheelchair bound prior to admission. She has yet to receive a prosthesis as prior BKA is healing. One level home with rehabilitation. Presented 06/30/2016 with gangrenous right heel and right great toe with findings of infrainguinal arterial occlusive disease. No change with conservative care and underwent right below knee amputation 07/08/2016 per Dr. [name]. Hospital course pain management.Teacher, music for fitting of Limb guard. Acute on chronic anemia 9.4 and monitor. Leukocytosis 20,600-23,000. Presently on intravenous heparin and  chronic Coumadin resumed. Hemodialysis ongoing as per renal services. Therapy evaluations pending. M.D. has requested physical medicine rehabilitation consult.Patient was admitted for a comprehensive rehabilitation program .    Patient currently requires S- mod with basic self-care skills secondary to muscle weakness, decreased cardiorespiratoy endurance and decreased awareness, decreased problem solving, decreased safety awareness, decreased memory and delayed processing .  Prior to hospitalization, patient could complete BADL with S- min.  Patient will benefit from skilled intervention to decrease level of assist with basic self-care skills prior to discharge home with care partner.  Anticipate patient will require minimal physical assistance and follow up home health.  OT - End of Session Endurance Deficit: Yes OT Assessment Barriers to Discharge: Decreased caregiver support Barriers to Discharge Comments: daughter may not be able to provide 24 hour supervision OT Patient demonstrates impairments in the following area(s): Balance;Cognition;Endurance;Pain;Safety;Skin Integrity OT Basic ADL's Functional Problem(s): Grooming;Bathing;Dressing;Toileting OT Transfers Functional Problem(s): Toilet;Tub/Shower OT Plan OT Intensity: Minimum of 1-2 x/day, 45 to 90 minutes OT Frequency: 5 out of 7 days OT Duration/Estimated Length of Stay: 12-14 OT Treatment/Interventions: Balance/vestibular training;Cognitive remediation/compensation;Discharge planning;Disease mangement/prevention;DME/adaptive equipment instruction;Functional mobility training;Pain management;Patient/family education;Psychosocial support;Self Care/advanced ADL retraining;Skin care/wound managment;Therapeutic Activities;Therapeutic Exercise;UE/LE Strength taining/ROM;UE/LE Coordination activities;Wheelchair propulsion/positioning OT Self Feeding Anticipated Outcome(s): MOD I OT Basic Self-Care Anticipated Outcome(s): S-MIN A OT Toileting  Anticipated Outcome(s): S OT Bathroom Transfers Anticipated Outcome(s): MIN A OT Recommendation Patient destination: Home Follow Up Recommendations: Home health OT Equipment Recommended: To be determined   Skilled Therapeutic Intervention 1:1. Pt educated on OT role/purpose, CIR, POC, and ELOS. Pt supine HOB elevated>sitting EOB with supervision using hand rails. Pt completes bathing seated EOB bathing 8/8 body parts with VC for lateral leans to wash peri area/buttocks. Pt dons underwear advancing pants past hips with lateral leans and dons hospital gown 2/2 continuous IV running. Pt slide board transfers with MOD A for lifting EOB<>w/c<>BSC with VC for sequencing, safety awareness, and head hip relationship. Pt completes oral care at sink. Exited session with pt seated in w/c with call light in reach and all needs met.   Session 2: Pt reporting pain in surgical sight. RN aware. Focus of session on slide board transfers to mat and BSC, toileting and cognition. Pt completes slide board transfers throughout session w/c<>mat (even surface) with supervision and A for placing board and w/c<>BSC>EOB with MOD A for  lifting going uphill. Pt requires Vc for head hip relationship and safety awareness. While seated on BSC pt requires A for clothing management however pt able to complete hygiene. In gym, pt learns rules of connect 4. Pt reaches in mod-max ranges outside BOS to retrieve pieces and place on board. With question cues pt able to strategize and anticipate where opponent would move next. Exited session with pt seated EOB with call light in reach, bed exit alarm on, and friend visiting.  OT Evaluation Precautions/Restrictions  Precautions Precautions: Fall Restrictions Weight Bearing Restrictions: Yes RLE Weight Bearing: Non weight bearing LLE Weight Bearing: Non weight bearing General Chart Reviewed: Yes Vital Signs   Pain Pain Assessment Pain Score: 0-No pain Home Living/Prior  Functioning Home Living Family/patient expects to be discharged to:: Private residence Living Arrangements: Children Available Help at Discharge: Family, Available PRN/intermittently Type of Home: Mobile home Home Access: Ramped entrance Home Layout: One level Bathroom Shower/Tub: Chiropodist: Handicapped height Bathroom Accessibility: Yes Additional Comments: A ramp has been built for her to enter home  Lives With: Family IADL History Homemaking Responsibilities: No Current License: No Mode of Transportation: Family Prior Function  Able to Take Stairs?: No Driving: No Comments: Liked to sew; watching grandchildren ADL   Vision Patient Visual Report: No change from baseline Perception  Perception: Within Functional Limits Praxis Praxis: Intact Cognition Overall Cognitive Status: Impaired/Different from baseline Arousal/Alertness: Awake/alert Orientation Level: Person;Place;Situation Person: Oriented Place: Oriented Situation: Oriented Year: 2018 Month: June Day of Week: Incorrect Memory: Impaired Memory Impairment: Retrieval deficit;Decreased recall of new information Immediate Memory Recall: Sock;Blue;Bed Memory Recall: Sock;Blue;Bed Memory Recall Sock: Without Cue Memory Recall Blue: Without Cue Memory Recall Bed: Without Cue Awareness: Impaired Awareness Impairment: Anticipatory impairment Problem Solving: Impaired Problem Solving Impairment: Functional complex;Verbal complex Safety/Judgment: Impaired Comments: pt with baseline cognitive deficits.  Sensation Sensation Light Touch: Appears Intact Proprioception: Appears Intact Coordination Gross Motor Movements are Fluid and Coordinated: Yes Fine Motor Movements are Fluid and Coordinated: Yes Motor  Motor Motor: Other (comment) Motor - Skilled Clinical Observations: generalized weakness Mobility  Transfers Transfers: Not assessed (safety concerns)  Trunk/Postural Assessment   Cervical Assessment Cervical Assessment: Within Functional Limits Thoracic Assessment Thoracic Assessment: Exceptions to [facility] (rounded shoulders) Lumbar Assessment Lumbar Assessment: Exceptions to [facility] (posterior pelvic tilt) Postural Control Postural Control: Within Functional Limits  Balance Balance Balance Assessed: Yes Dynamic Sitting Balance Sitting balance - Comments: Pt able to don underwear with unsupported sitting Extremity/Trunk Assessment RUE Assessment RUE Assessment: Exceptions to [facility] (generalized weakness) LUE Assessment LUE Assessment: Exceptions to [facility] (generalized weakness)   See Function Navigator for Current Functional Status.   Refer to Care Plan for Long Term Goals  Recommendations for other services: None    Discharge Criteria: Patient will be discharged from OT if patient refuses treatment 3 consecutive times without medical reason, if treatment goals not met, if there is a change in medical status, if patient makes no progress towards goals or if patient is discharged from hospital.  The above assessment, treatment plan, treatment alternatives and goals were discussed and mutually agreed upon: by patient  Tonny Branch 07/11/2016, 10:25 AM

## 2016-07-11 NOTE — Progress Notes (Signed)
Patient information reviewed and entered into eRehab system by Salisa Broz, RN, CRRN, PPS Coordinator.  Information including medical coding and functional independence measure will be reviewed and updated through discharge.     Per nursing patient was given "Data Collection Information Summary for Patients in Inpatient Rehabilitation Facilities with attached "Privacy Act Statement-Health Care Records" upon admission.  

## 2016-07-11 NOTE — Progress Notes (Signed)
ANTICOAGULATION CONSULT NOTE - Follow-Up  Pharmacy Consult for Heparin + Warfarin Indication: mechanical AVR   Allergies  Allergen Reactions  . Acetaminophen Other (See Comments)    Liver transplant recipient   . Codeine Itching  . Mirtazapine Other (See Comments)    hallucination    Patient Measurements: Height: 5\' 4"  (162.6 cm) Weight: 125 lb 4.8 oz (56.8 kg) IBW/kg (Calculated) : 54.7 Heparin Dosing Weight: 52.6 kg  Vital Signs: Temp: 98.5 F (36.9 C) (06/01 0500) Temp Source: Oral (06/01 0500) BP: 140/67 (06/01 0500) Pulse Rate: 66 (06/01 0500)  Labs:  Recent Labs  07/09/16 0338 07/09/16 5427  07/10/16 0629 07/10/16 1946 07/11/16 1006  HGB 9.2* 8.4*  --  9.4*  --   --   HCT 26.2* 24.1*  --  26.9*  --   --   PLT 154 135*  --  137*  --   --   LABPROT 19.3*  --   --  20.9*  --  25.9*  INR 1.60  --   --  1.78  --  2.32  HEPARINUNFRC 0.20*  --   < > 0.40 0.28* 0.44  CREATININE  --  5.28*  --  3.39*  --   --   < > = values in this interval not displayed.  Estimated Creatinine Clearance: 15 mL/min (A) (by C-G formula based on SCr of 3.39 mg/dL (H)).   Assessment: 30 YOF on warfarin PTA for mechanical AVR (INR goal 2.5-3.5) held for procedures, restarted coumadin 5/30, with heparin bridge until INR is therapeutic. INR 1.78 > 2.32, heparin level 0.44 on 1150 units/hr. Hgb 9.4, pltc 9.4, low stable  - Recent course of flagyl (5/22 > 5/30) - ESRD  * PTA warf regimen: 1.25 mg daily except 2.5 mg on Sundays, Tuesdays, Thursdays  Goal of Therapy:  Heparin level 0.3-0.7 units/ml INR 2.5-3.5 Monitor platelets by anticoagulation protocol: Yes   Plan:  - Continue heparin to 1150 units/hr - Warf 1 mg x 1 today - Watch CBGs - Watch Ca with phos binder - consider switch  Maryanna Shape, PharmD, BCPS  Clinical Pharmacist  Pager: [phone]   07/11/2016 1:49 PM

## 2016-07-11 NOTE — Care Management Note (Signed)
[facility] Individual Statement of Services  Patient Name:  [name]  Date:  07/11/2016  Welcome to the [facility].  Our goal is to provide you with an individualized program based on your diagnosis and situation, designed to meet your specific needs.  With this comprehensive rehabilitation program, you will be expected to participate in at least 3 hours of rehabilitation therapies Monday-Friday, with modified therapy programming on the weekends.  Your rehabilitation program will include the following services:  Physical Therapy (PT), Occupational Therapy (OT), 24 hour per day rehabilitation nursing, Case Management (Social Worker), Rehabilitation Medicine, Nutrition Services and Pharmacy Services  Weekly team conferences will be held on Wednesday to discuss your progress.  Your Social Worker will talk with you frequently to get your input and to update you on team discussions.  Team conferences with you and your family in attendance may also be held.  Expected length of stay: 7-10 days Overall anticipated outcome: min assist wheelchair level  Depending on your progress and recovery, your program may change. Your Social Worker will coordinate services and will keep you informed of any changes. Your Social Worker's name and contact numbers are listed  below.  The following services may also be recommended but are not provided by the [facility]:    [place] will be made to provide these services after discharge if needed.  Arrangements include referral to agencies that provide these services.  Your insurance has been verified to be:  [employer] Your primary doctor is:  Lavone Orn  Pertinent information will be shared with your doctor and your insurance company.  Social Worker:  Ovidio Kin, [place] or [phone]  Information  discussed with and copy given to patient by: Elease Hashimoto, 07/11/2016, 12:27 PM

## 2016-07-11 NOTE — Evaluation (Signed)
Physical Therapy Assessment and Plan  Patient Details  Name: [name] MRN: [id number] Date of Birth: March 28, 1955  PT Diagnosis: Contracture of joint: R knee, Difficulty walking, Muscle weakness and Pain in R residual limb Rehab Potential: Good ELOS: 13-16days    Today's Date: 07/11/2016 PT Individual Time: 0905-1000 PT Individual Time Calculation (min): 55 min    Problem List:  Patient Active Problem List   Diagnosis Date Noted  . Amputation of right lower extremity below knee ([place]) 07/10/2016  . History of liver transplant ([place])   . Post-operative pain   . Unilateral complete BKA, right, subsequent encounter ([place])   . S/P bilateral BKA (below knee amputation) ([place])   . Malnutrition of moderate degree 07/01/2016  . Cellulitis 06/30/2016  . PAD (peripheral artery disease) ([place]) 06/30/2016  . Diabetic foot infection ([place]) 06/30/2016  . Pressure injury of skin 05/28/2016  . Volume overload 05/28/2016  . Acute exacerbation of CHF (congestive heart failure) ([place]) 05/27/2016  . Extremity atherosclerosis with resting pain ([place])   . H/O heart valve replacement with mechanical valve   . Phantom limb pain ([place])   . Subtherapeutic international normalized ratio (INR)   . Supratherapeutic INR   . Anemia of chronic disease   . Chronic midline low back pain without sciatica   . ESRD on dialysis ([place])   . Type 2 diabetes mellitus with complication, with long-term current use of insulin ([place])   . S/P unilateral BKA (below knee amputation), left ([place]) 04/30/2016  . Unilateral complete BKA, left, initial encounter ([place]) 04/30/2016  . Abnormality of gait   . Coronary artery disease involving coronary bypass graft of native heart without angina pectoris   . Benign essential HTN   . History of migraine   . Leukocytosis   . Critical lower limb ischemia 04/22/2016  . Foot pain 04/21/2016  . Fever   . Advance care planning   . Goals of care, counseling/discussion   . Palliative care by  specialist   . Sepsis ([place])   . Hypotension   . Claudication of left lower extremity ([place])   . Elevated troponin 03/05/2016  . Pressure injury of skin, stage 2 11/27/2015  . H/O unilateral nephrectomy 07/06/2015  . Paroxysmal SVT (supraventricular tachycardia) ([place]) 06/22/2014  . Complications due to renal dialysis device, implant, and graft 05/11/2014  . Acute blood loss anemia 03/31/2014  . Renal dialysis device, implant, or graft complication 08/06/9483  . Acute hyperkalemia   . S/P liver transplant ([place])   . Chronic hepatitis C without hepatic coma ([place])   . Pulmonary edema 01/03/2014  . Respiratory failure ([place]) 01/03/2014  . Long term current use of anticoagulant therapy 04/22/2013  . Chronic anticoagulation w/ coumadin, goal INR 2.5-3.0 w/ AVR 04/14/2013  . Tobacco abuse 02/28/2013  . COPD (chronic obstructive pulmonary disease) ([place]) 02/21/2013  . Chronic combined systolic and diastolic congestive heart failure ([place]) 02/06/2013  . Acute respiratory failure ([place]) 11/02/2012  . Acute pulmonary edema ([place]) 11/02/2012  . Acute on chronic systolic CHF (congestive heart failure) ([place]) 11/02/2012  . Dyslipidemia-LDL 104, not on statin with Hx of liver transplant 07/30/2012  . CAD (coronary artery disease), native coronary artery 07/27/2012  . History of prosthetic aortic valve replacement   . End stage renal disease on dialysis ([place]) 02/27/2011  . Hypothyroidism 06/25/2006  . Type 2 diabetes mellitus with chronic kidney disease ([place]) 06/25/2006  . GERD 06/25/2006  . Anemia due to chronic renal failure    . Hypertensive heart  disease     Past Medical History:  Past Medical History:  Diagnosis Date  . Anemia    takes Folic Acid daily  . Anxiety   . Aortic stenosis   . Arthritis    "left hand, back" (08/30/2012)  . Asthma   . CAD (coronary artery disease)   . CAD (coronary artery disease) Jan. 2015   Cath: 20% LAD, 50% D1; s/p LIMA-LAD  . Chest pain 03/06/2015  . CHF  (congestive heart failure) ([place]) 05/2016  . Chronic back pain   . Chronic bronchitis ([place])    "q yr w/season changes" (08/30/2012)  . Chronic constipation    takes MIralax and Colace daily  . COPD (chronic obstructive pulmonary disease) ([place])   . Depression    takes Cymbalta for "severe" depression  . End stage renal disease on dialysis [facility]) 02/27/2011   Kidneys shut down at time of liver transplant in Sept 2011 at [facility] in [place], she has been on HD ever since.  [facility]yzes [facility] on TTS schedule.  Had L forearm graft used 10 months then removed Dec 2012 due to suspected infection.  A right upper arm AVG was placed Dec 2012 but she developed steal symptoms acutely and it was ligated the same day.  Never had an AV fistula due to small veins.  Now has L thigh AVG put in Jan 2013, has not clotted to date.    [name] GERD (gastroesophageal reflux disease)    takes Omeprazole daily  . Headache    "at least monthly" (08/30/2012)  . Hepatitis C   . History of blood transfusion    "several" (08/30/2012)  . Hypertension    takes Metoprolol and Lisinopril daily, sees Dr Bea Graff  . Hypothyroidism    takes Synthroid daily  . Migraine    "last migraine was in 2013" (08/30/2012)  . Neuromuscular disorder ([place])    carpal tunnel in right hand  . Obesity   . Peripheral vascular disease (HCC) hands and legs  . Pneumonia    "today and several times before" (08/30/2012)  . 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 S/P unilateral BKA (below knee amputation), left ([place]) 04/30/2016  . SVT (supraventricular tachycardia) ([place]) 06/09/14  . Tobacco abuse   . Type II diabetes mellitus (HCC)    Levemir 2units daily if > 150   Past Surgical History:  Past Surgical History:  Procedure Laterality Date  . ABDOMINAL AORTOGRAM W/LOWER EXTREMITY N/A 07/04/2016   Procedure: Abdominal  Aortogram w/Lower Extremity;  Surgeon: Elam Dutch, MD;  Location:  CV LAB;  Service: Cardiovascular;  Laterality: N/A;  . AMPUTATION Left 04/24/2016   Procedure: AMPUTATION BELOW KNEE;  Surgeon: Angelia Mould, MD;  Location: [place];  Service: Vascular;  Laterality: Left;  . AMPUTATION Right 07/08/2016   Procedure: AMPUTATION BELOW KNEE;  Surgeon: Angelia Mould, MD;  Location: [place];  Service: Vascular;  Laterality: Right;  . AORTIC VALVE REPLACEMENT N/A 03/15/2013   AVR; Surgeon: Ivin Poot, MD;  Location[facility] OR; Open Heart Surgery;  31m[employer] mechanical prosthesis, top hat valve  . ARTERIOVENOUS GRAFT PLACEMENT Left 10/03/10    forearm  . AV FISTULA PLACEMENT  01/29/2011   Procedure: INSERTION OF ARTERIOVENOUS (AV) GORE-TEX GRAFT ARM;  Surgeon: CElam Dutch MD;  [facility]:  [facility] OR;  Service: Vascular;  Laterality: Right;  . AV FISTULA PLACEMENT  03/10/2011   Procedure: INSERTION OF ARTERIOVENOUS (AV) GORE-TEX GRAFT THIGH;  Surgeon: Elam Dutch, MD;  Location: [place];  Service: Vascular;  Laterality: Left;  . [place] REMOVAL  12/23/2010   Procedure: REMOVAL OF ARTERIOVENOUS GORETEX GRAFT ([place]);  Surgeon: Elam Dutch, MD;  Location: [place];  Service: Vascular;  Laterality: Left;  procedure started _0 -1852  . CHOLECYSTECTOMY  1993  . CORONARY ARTERY BYPASS GRAFT N/A 03/15/2013   Procedure: CORONARY ARTERY BYPASS GRAFTING (CABG) times one using left internal mammary artery.;  Surgeon: Ivin Poot, MD;  Location: [place];  Service: Open Heart Surgery;  Laterality: N/A;  POSS CABG X 1  . CYSTOSCOPY  1990's  . INSERTION OF DIALYSIS CATHETER  12/23/2010   Procedure: INSERTION OF DIALYSIS CATHETER;  Surgeon: Elam Dutch, MD;  Location: [place];  Service: Vascular;  Laterality: Right;  Right Internal Jugular 28cm dialysis catheter insertion procedure time 1701-1720   . INSERTION OF DIALYSIS CATHETER Right 03/11/2016   Procedure: INSERTION OF DIALYSIS  CATHETER;  Surgeon: Angelia Mould, MD;  Location: [place];  Service: Vascular;  Laterality: Right;  . INTRAOPERATIVE TRANSESOPHAGEAL ECHOCARDIOGRAM N/A 03/15/2013   Procedure: INTRAOPERATIVE TRANSESOPHAGEAL ECHOCARDIOGRAM;  Surgeon: Ivin Poot, MD;  Location: [place];  Service: Open Heart Surgery;  Laterality: N/A;  . LEFT HEART CATHETERIZATION WITH CORONARY ANGIOGRAM N/A 07/29/2012   Procedure: LEFT HEART CATHETERIZATION WITH CORONARY ANGIOGRAM;  Surgeon: Troy Sine, MD;  Location: [facility] CATH LAB;  Service: Cardiovascular;  Laterality: N/A;  . LEFT HEART CATHETERIZATION WITH CORONARY ANGIOGRAM N/A 03/10/2013   Procedure: LEFT HEART CATHETERIZATION WITH CORONARY ANGIOGRAM;  Surgeon: Troy Sine, MD;  Location: [facility] CATH LAB;  Service: Cardiovascular;  Laterality: N/A;  . LEFT HEART CATHETERIZATION WITH CORONARY/GRAFT ANGIOGRAM N/A 12/24/2011   Procedure: LEFT HEART CATHETERIZATION WITH Beatrix Fetters;  Surgeon: [name], MD;  Location: [facility] CATH LAB;  Service: Cardiovascular;  Laterality: N/A;  . LEFT HEART CATHETERIZATION WITH CORONARY/GRAFT ANGIOGRAM N/A 12/16/2013   Procedure: LEFT HEART CATHETERIZATION WITH Beatrix Fetters;  Surgeon: Troy Sine, MD;  Location: [facility] CATH LAB;  Service: Cardiovascular;  Laterality: N/A;  . LIGATION ARTERIOVENOUS GORTEX GRAFT Left 03/11/2016   Procedure: LIGATION THIGH ARTERIOVENOUS GORTEX GRAFT;  Surgeon: Angelia Mould, MD;  Location: [place];  Service: Vascular;  Laterality: Left;  . LIVER TRANSPLANT  10/25/2009   sees Dr Ferol Luz 1 every 6 months, saw last in Dec 2013. [name] Coord [phone]  . PERIPHERAL VASCULAR CATHETERIZATION N/A 11/06/2015   Procedure: Abdominal Aortogram;  Surgeon: Serafina Mitchell, MD;  Location: [place] CV LAB;  Service: Cardiovascular;  Laterality: N/A;  . PERIPHERAL VASCULAR CATHETERIZATION N/A 11/06/2015   Procedure: Lower Extremity Angiography;  Surgeon: Serafina Mitchell, MD;  Location:  [place] CV LAB;  Service: Cardiovascular;  Laterality: N/A;  . PERIPHERAL VASCULAR CATHETERIZATION  11/06/2015   Procedure: Peripheral Vascular Atherectomy;  Surgeon: Serafina Mitchell, MD;  Location: [place] CV LAB;  Service: Cardiovascular;;  Left Superficial femoral  . SHUNTOGRAM Left 05/15/2014   Procedure: SHUNTOGRAM;  Surgeon: Conrad [place], MD;  Location: [facility] CATH LAB;  Service: Cardiovascular;  Laterality: Left;  . SMALL INTESTINE SURGERY  90's  . SPINAL GROWTH RODS  2010   "put 2 metal rods in my back; they had detetriorated" (08/30/2012)  . THROMBECTOMY    . THROMBECTOMY AND REVISION OF ARTERIOVENTOUS (AV) GORETEX  GRAFT Left 03/30/2014  . THROMBECTOMY AND REVISION OF ARTERIOVENTOUS (AV) GORETEX  GRAFT Left 03/30/2014   Procedure: THROMBECTOMY AND REVISION OF ARTERIOVENTOUS (AV) GORETEX  GRAFT;  Surgeon: Conrad [place], MD;  Location: [place];  Service: Vascular;  Laterality: Left;  . TUBAL LIGATION  1990's    Assessment & Plan Clinical Impression: Patient is a 61 y.o. 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 She was discharged to home with home health therapies. Per chart review patient lives with daughter. Essentially wheelchair bound prior to admission. She has yet to receive a prosthesis as prior BKA is healing. One level home with rehabilitation. Presented 06/30/2016 with gangrenous right heel and right great toe with findings of infrainguinal arterial occlusive disease. No change with conservative care and underwent right below knee amputation 07/08/2016 per Dr. [name]. Hospital course pain management.Teacher, music for fitting of Limb guard. Acute on chronic anemia 9.4 and monitor. Leukocytosis 20,600-23,000. Presently on intravenous  heparin and chronic Coumadin resumed.   Patient transferred to CIR on 07/10/2016 .   Patient currently requires max with mobility secondary to muscle weakness and muscle joint tightness, decreased cardiorespiratoy endurance, decreased awareness, decreased problem solving, decreased safety awareness, decreased memory and delayed processing and decreased sitting balance, decreased balance strategies and difficulty maintaining precautions.  Prior to hospitalization, patient was supervision with mobility and lived with Family in a Mobile home home.  Home access is  Ramped entrance.  Patient will benefit from skilled PT intervention to maximize safe functional mobility, minimize fall risk and decrease caregiver burden for planned discharge home with 24 hour assist.  Anticipate patient will benefit from follow up [facility] at discharge.  PT - End of Session Activity Tolerance: Tolerates 10 - 20 min activity with multiple rests Endurance Deficit: Yes PT Assessment Rehab Potential (ACUTE/IP ONLY): Good Barriers to Discharge: Decreased caregiver support;Other (comment);Inaccessible home environment (multiple limb loss, ) PT Patient demonstrates impairments in the following area(s): Balance;Behavior;Endurance;Motor;Pain;Safety;Sensory;Skin Integrity PT Transfers Functional Problem(s): Bed Mobility;Bed to Chair;Car;Furniture;Floor PT Locomotion Functional Problem(s): Wheelchair Mobility;Stairs PT Plan PT Intensity: Minimum of 1-2 x/day ,45 to 90 minutes PT Frequency: 5 out of 7 days PT Duration Estimated Length of Stay: 13-16days  PT Treatment/Interventions: Balance/vestibular training;Cognitive remediation/compensation;Community reintegration;Discharge planning;Disease management/prevention;DME/adaptive equipment instruction;Functional electrical stimulation;Functional mobility training;Neuromuscular re-education;Pain management;Patient/family education;Skin care/wound management;Psychosocial  support;Splinting/orthotics;Therapeutic Activities;Therapeutic Exercise;UE/LE Strength taining/ROM;UE/LE Coordination activities;Wheelchair propulsion/positioning PT Transfers Anticipated Outcome(s): Min assist with LRAD  PT Locomotion Anticipated Outcome(s): Supervision level with WC  PT Recommendation Recommendations for Other Services: Neuropsych consult;Therapeutic Recreation consult Therapeutic Recreation Interventions: Kitchen group;Stress management Follow Up Recommendations: Home health PT Patient destination: Home Equipment Recommended: To be determined;Sliding board Equipment Details: Has WC   Skilled Therapeutic Intervention PT instructed patient in PT Evaluation and initiated treatment intervention; see below for results. PT educated patient in [place], rehab potential, rehab goals, and discharge recommendations. PT noted excessive wound drainage at incision site. RN notified and performed dressing change. PT instructed pt in bed mobility, transfers, and WC mobility as listed below. PT fit pt for 16x16 WC to reduce energy expenditure with WC propulsion as well as improve positioning of BLE on amputee support pads. Patient returned too room and left sitting in [facility] with call bell in reach and all needs met.      PT Evaluation Precautions/Restrictions   General  Vital Signs  Pain Pain Assessment Pain Assessment: No/denies pain Home Living/Prior Functioning Home Living Living Arrangements: Children Vision/Perception  Perception Perception: Within Functional Limits Praxis Praxis: Intact  Cognition Overall Cognitive Status: Impaired/Different from baseline Arousal/Alertness: Awake/alert Orientation Level: Oriented X4 Memory: Impaired Memory Impairment: Retrieval deficit;Decreased recall of new information Awareness: Impaired Awareness Impairment: Anticipatory impairment Problem Solving: Impaired Problem Solving Impairment: Functional complex;Verbal complex Safety/Judgment:  Impaired Comments: pt with baseline cognitive deficits.  Sensation Sensation Light Touch: Appears Intact Proprioception: Appears Intact Coordination Gross Motor Movements are Fluid and Coordinated: Yes Fine Motor Movements are Fluid and Coordinated: Yes Motor  Motor Motor: Other (comment) Motor - Skilled Clinical Observations: generalized weakness  Mobility Bed Mobility Bed Mobility: Rolling Right;Rolling Left Rolling Right: 4: Min assist Rolling Right Details: Verbal cues for technique;Verbal cues for precautions/safety Rolling Left: 4: Min assist Rolling Left Details: Verbal cues for technique;Verbal cues for precautions/safety Transfers Transfers: Yes Lateral/Scoot Transfers: 2: Max assist Lateral/Scoot Transfer Details: Verbal cues for technique;Verbal cues for precautions/safety;Manual facilitation for placement;Manual facilitation for weight shifting;Manual facilitation for weight bearing;Tactile cues for posture Locomotion  Ambulation Ambulation: No Gait Gait: No Stairs / Additional Locomotion Stairs: No Wheelchair Mobility Wheelchair Mobility: Yes Wheelchair Assistance: 4: Min assist (for [employer]) Wheelchair Assistance Details: Verbal cues for technique;Verbal cues for safe use of DME/AE Wheelchair Propulsion: Both upper extremities Wheelchair Parts Management: Needs assistance Distance: 150  Trunk/Postural Assessment  Cervical Assessment Cervical Assessment: Within Functional Limits Thoracic Assessment Thoracic Assessment: Exceptions to [facility] (rounded shoulders) Lumbar Assessment Lumbar Assessment: Exceptions to [facility] (posterior pelvic tilt) Postural Control Postural Control: Within Functional Limits  Balance Balance Balance Assessed: Yes Dynamic Sitting Balance Sitting balance - Comments: Pt able to don underwear with unsupported sitting Extremity Assessment  RUE Assessment RUE Assessment: Exceptions to [facility] (generalized weakness) LUE  Assessment LUE Assessment: Exceptions to [facility] (generalized weakness) RLE Assessment RLE Assessment: Exceptions to [facility] RLE AROM (degrees) RLE Overall AROM Comments: limited knee extension (see below). all others WFL Right Knee Extension: -35 RLE Strength RLE Overall Strength Comments: 4/5 hip and 3/5 knee due to pain LLE Assessment LLE Assessment: Within Functional Limits (hip and knee. BKA)   See Function Navigator for Current Functional Status.   Refer to Care Plan for Long Term Goals  Recommendations for other services: Neuropsych and Therapeutic Recreation  Kitchen group and Stress management  Discharge Criteria: Patient will be discharged from PT if patient refuses treatment 3 consecutive times without medical reason, if treatment goals not met, if there is a change in medical status, if patient makes no progress towards goals or if patient is discharged from hospital.  The above assessment, treatment plan, treatment alternatives and goals were discussed and mutually agreed upon: by patient  [name] 07/11/2016, 12:59 PM

## 2016-07-11 NOTE — Progress Notes (Signed)
   VASCULAR SURGERY ASSESSMENT & PLAN:   POD 3 S/P right BKA.  Continue daily dressing changes  Knee immobilizer to prevent knee contracture  SUBJECTIVE:   Pain much better controlled.  PHYSICAL EXAM:   Vitals:   07/10/16 1708 07/10/16 2300 07/11/16 0500  BP: (!) 116/59  140/67  Pulse: 77  66  Resp: 17  18  Temp: 98.4 F (36.9 C)  98.5 F (36.9 C)  TempSrc: Oral  Oral  SpO2: 97%  96%  Weight:  125 lb 11.2 oz (57 kg) 125 lb 4.8 oz (56.8 kg)  Height: 5\' 4"  (1.626 m)     Dressing right BKA is clean and dry.  LABS:   Lab Results  Component Value Date   WBC 23.5 (H) 07/10/2016   HGB 9.4 (L) 07/10/2016   HCT 26.9 (L) 07/10/2016   MCV 83.0 07/10/2016   PLT 137 (L) 07/10/2016   Lab Results  Component Value Date   CREATININE 3.39 (H) 07/10/2016   Lab Results  Component Value Date   INR 2.32 07/11/2016   CBG (last 3)   Recent Labs  07/10/16 2104 07/11/16 0620 07/11/16 1152  GLUCAP 194* 155* 141*    PROBLEM LIST:    Principal Problem:   Amputation of right lower extremity below knee (HCC) Active Problems:   End stage renal disease on dialysis ([place])   S/P liver transplant ([place])   Benign essential HTN   Leukocytosis   Type 2 diabetes mellitus with complication, with long-term current use of insulin (HCC)   CURRENT MEDS:   . aspirin EC  81 mg Oral Daily  . calcitRIOL  0.5 mcg Oral Q M,W,F-HD  . calcium acetate  667 mg Oral TID WC  . [START ON 07/14/2016] darbepoetin (ARANESP) injection - DIALYSIS  60 mcg Intravenous Q Mon-HD  . docusate sodium  100 mg Oral Daily  . feeding supplement  1 Container Oral TID BM  . levothyroxine  175 mcg Oral QAC breakfast  . metoCLOPramide  5 mg Oral BID AC  . metoprolol tartrate  25 mg Oral BID  . multivitamin  1 tablet Oral QHS  . mupirocin ointment  1 application Nasal BID  . polyethylene glycol  17 g Oral BID  . pregabalin  75 mg Oral Daily  . senna-docusate  1 tablet Oral BID  . tacrolimus  2 mg Oral BID  .  warfarin  1 mg Oral ONCE-1800  . Warfarin - Pharmacist Dosing Inpatient   Does not apply [place]: [phone] Office: [phone] 07/11/2016

## 2016-07-12 ENCOUNTER — Inpatient Hospital Stay (HOSPITAL_COMMUNITY): Payer: Medicare Other | Admitting: Physical Therapy

## 2016-07-12 ENCOUNTER — Inpatient Hospital Stay (HOSPITAL_COMMUNITY): Payer: Medicare Other

## 2016-07-12 LAB — PROTIME-INR
INR: 2.57
PROTHROMBIN TIME: 28.1 s — AB (ref 11.4–15.2)

## 2016-07-12 LAB — GLUCOSE, CAPILLARY
GLUCOSE-CAPILLARY: 175 mg/dL — AB (ref 65–99)
GLUCOSE-CAPILLARY: 204 mg/dL — AB (ref 65–99)
Glucose-Capillary: 181 mg/dL — ABNORMAL HIGH (ref 65–99)
Glucose-Capillary: 196 mg/dL — ABNORMAL HIGH (ref 65–99)

## 2016-07-12 LAB — HEPARIN LEVEL (UNFRACTIONATED): HEPARIN UNFRACTIONATED: 0.34 [IU]/mL (ref 0.30–0.70)

## 2016-07-12 MED ORDER — METOPROLOL TARTRATE 12.5 MG HALF TABLET
12.5000 mg | ORAL_TABLET | Freq: Two times a day (BID) | ORAL | Status: DC
  Administered 2016-07-12 – 2016-07-24 (×22): 12.5 mg via ORAL
  Filled 2016-07-12 (×24): qty 1

## 2016-07-12 MED ORDER — WARFARIN SODIUM 1 MG PO TABS
1.0000 mg | ORAL_TABLET | Freq: Once | ORAL | Status: AC
  Administered 2016-07-12: 1 mg via ORAL
  Filled 2016-07-12: qty 1

## 2016-07-12 NOTE — Progress Notes (Signed)
ANTICOAGULATION CONSULT NOTE - Follow-Up  Pharmacy Consult for Heparin + Warfarin Indication: mechanical AVR   Allergies  Allergen Reactions  . Acetaminophen Other (See Comments)    Liver transplant recipient   . Codeine Itching  . Mirtazapine Other (See Comments)    hallucination    Patient Measurements: Height: 5\' 4"  (162.6 cm) Weight: 121 lb 9.6 oz (55.2 kg) IBW/kg (Calculated) : 54.7 Heparin Dosing Weight: 52.6 kg  Vital Signs: Temp: 99.3 F (37.4 C) (06/02 0512) Temp Source: Oral (06/02 0512) BP: 121/64 (06/02 0512) Pulse Rate: 95 (06/02 0512)  Labs:  Recent Labs  07/09/16 1740  07/10/16 0629 07/10/16 1946 07/11/16 1006 07/11/16 1449 07/11/16 1450 07/11/16 1619 07/12/16 0641  HGB 8.4*  --  9.4*  --   --  7.1*  --  8.7*  --   HCT 24.1*  --  26.9*  --   --  20.2*  --  24.7*  --   PLT 135*  --  137*  --   --  162  --   --   --   LABPROT  --   --  20.9*  --  25.9*  --   --   --  28.1*  INR  --   --  1.78  --  2.32  --   --   --  2.57  HEPARINUNFRC  --   < > 0.40 0.28* 0.44  --   --   --  0.34  CREATININE 5.28*  --  3.39*  --   --   --  4.86*  --   --   < > = values in this interval not displayed.  Estimated Creatinine Clearance: 10.5 mL/min (A) (by C-G formula based on SCr of 4.86 mg/dL (H)).   Assessment: 51 YOF on warfarin PTA for mechanical AVR (INR goal 2.5-3.5) held for procedures, restarted coumadin 5/30, with heparin bridge until INR is therapeutic. INR borderline therapeutic today, heparin level down slightly, but still therapeutic on current rate. Hgb down slightly, pltc 162, low stable. Would continue heparin 1 more day since only borderline therapeutic.   - Recent course of flagyl (5/22 > 5/30) - ESRD  * PTA warf regimen: 1.25 mg daily except 2.5 mg on Sundays, Tuesdays, Thursdays  Goal of Therapy:  Heparin level 0.3-0.7 units/ml INR 2.5-3.5 Monitor platelets by anticoagulation protocol: Yes   Plan:  - Continue heparin to 1150 units/hr -  Warf 1 mg x 1 today - Monitor s/sx of bleeding  Dierdre Harness, [place], PharmD Clinical Pharmacy Resident [phone] (Pager) 07/12/2016 8:25 AM

## 2016-07-12 NOTE — Progress Notes (Signed)
Physical Therapy Session Note  Patient Details  Name: [name] MRN: [id number] Date of Birth: Jan 15, 1956  Today's Date: 07/12/2016 PT Individual Time: 1300-1415 PT Individual Time Calculation (min): 75 min   Short Term Goals: Week 1:    STG =LTG due to ELOS.   Skilled Therapeutic Interventions/Progress Updates:   Pt received sitting in WC and agreeable to PT  Pt instructed by PT in WC mobility to rehab gym x 150ft with supervision assist for proper posture and improved ROM to allow increased efficiency  To WC propulsion.  Pt performed slight uphill SB transfer with min assist from PT including set up. Pt demonstrated improved head/hips relationship on this day compared to previous session, but still required min cues to maintain anterior weight shift.  Sit>supine on mat table with min assist from PT through roll to R sidelying. Pt demonstrated difficulty maintaining balance with one near LOB posteriorly prior to coming to supine position. BLE knee extension from supported position on bolster 2 x 10. glute/quad set to encourage knee extension while LE supported on pillow. Pt attempted to roll to prone but unable due to pain in L residual limb.  Supine>long sitting with min-mod assist from PT. Pt instructed in A/P transfer to [facility] with posterior technique. Min assist for balance and moderate cues for technique and positioning of BLE.  BUE endurance training 5 min forward, 5 minutes backward. Pt declined to attempt car transfer due to fatigue and request returning to bed. PT transported pt to room in [facility]. Lateral scoot transfer with SB and min assist from PT. Moderate cues for positioning on SB to prevent anterior LOB.  Pt left sitting EOB with call bell in reach and bed alarm activated.       Therapy Documentation Precautions:  Precautions Precautions: Fall Restrictions Weight Bearing Restrictions: Yes RLE Weight Bearing: Non weight bearing LLE Weight Bearing: Non weight  bearing Pain: Pain Assessment Pain Assessment: 0-10 Pain Score: 8  Pain Type: Acute pain Pain Location: Leg Pain Orientation: Right Pain Descriptors / Indicators: Aching Pain Frequency: Constant Pain Onset: On-going Pain Intervention(s): Medication (See eMAR)  See Function Navigator for Current Functional Status.   Therapy/Group: Individual Therapy  [name] 07/12/2016, 2:21 PM

## 2016-07-12 NOTE — Progress Notes (Signed)
Occupational Therapy Session Note  Patient Details  Name: [name] MRN: [id number] Date of Birth: [birth year]  Today's Date: 07/12/2016 OT Individual Time: 6190-1222 OT Individual Time Calculation (min): 72 min    Short Term Goals: Week 1:  OT Short Term Goal 1 (Week 1): Pt will complete clothing managment prior to toileting with supervision OT Short Term Goal 2 (Week 1): Pt will complete lower body dressing with MIN A OT Short Term Goal 3 (Week 1): Pt will maintain dynamic sitting balance for 5 min with set up OT Short Term Goal 4 (Week 1): Pt will consistantly complete slide board transfers with MOD A (or less)  Skilled Therapeutic Interventions/Progress Updates:    1:1. Pt asleep upon arrival but easily arousend. RN quickly changes dressing on RLE. Pt bathes seated EOB with supervision and Vc to lean laterally to wash buttocks and A for washing back. Pt dons underwear and pants with significantly increased time to push up to midline and tactile cues for hand placement on waisteband to pull up pants. Pt slide board transfer EOB>w/c with A to place board. In w/c pt completes oral care, hair care, and shaves at sink. Exited session with pt seated in w/c call light in reach and all needs met.   Therapy Documentation Precautions:  Precautions Precautions: Fall Restrictions Weight Bearing Restrictions: Yes RLE Weight Bearing: Non weight bearing LLE Weight Bearing: Non weight bearing  See Function Navigator for Current Functional Status.   Therapy/Group: Individual Therapy  Tonny Branch 07/12/2016, 12:12 PM

## 2016-07-12 NOTE — Progress Notes (Signed)
Occupational Therapy Session Note  Patient Details  Name: [name] MRN: [id number] Date of Birth: [birth year]  Today's Date: 07/12/2016 OT Individual Time: 1615-1700 OT Individual Time Calculation (min): 45 min    Short Term Goals: Week 1:  OT Short Term Goal 1 (Week 1): Pt will complete clothing managment prior to toileting with supervision OT Short Term Goal 2 (Week 1): Pt will complete lower body dressing with MIN A OT Short Term Goal 3 (Week 1): Pt will maintain dynamic sitting balance for 5 min with set up OT Short Term Goal 4 (Week 1): Pt will consistantly complete slide board transfers with MOD A (or less)  Skilled Therapeutic Interventions/Progress Updates:    1:1. RN administering medication and preparing IV as OT enters room. Pt lethargic dring beginning of session with VC and tactile cues to maintain arousal. Pt slide board transfers EOB<>w/c with touching A to place board and VC for head hip relationship. Pt slide board transfer w/c<>BSC with up to MOD A for lifting for uphill transfer to get onto [facility].[name] Pt leans laterally to complete clothing management prior to toileting and require A to advance pants up hips after having BM. Pt daughter enters part way through session and OT begins overview of slide board transfer training, explaining technique and demonstrating placement of board. Daughter will need continued practice. Exited session with pt semi reclined in bed with call light in reach and all needs met.   Therapy Documentation Precautions:  Precautions Precautions: Fall Restrictions Weight Bearing Restrictions: Yes RLE Weight Bearing: Non weight bearing LLE Weight Bearing: Non weight bearing See Function Navigator for Current Functional Status.   Therapy/Group: Individual Therapy  Tonny Branch 07/12/2016, 5:50 PM

## 2016-07-12 NOTE — Progress Notes (Signed)
Subjective: Interval History: has complaints pain in stump.  Objective: Vital signs in last 24 hours: Temp:  [97.5 F (36.4 C)-99.3 F (37.4 C)] 99.3 F (37.4 C) (06/02 0512) Pulse Rate:  [74-95] 95 (06/02 0512) Resp:  [17-20] 18 (06/02 0512) BP: (87-145)/(47-115) 121/64 (06/02 0512) SpO2:  [95 %-96 %] 95 % (06/02 0512) Weight:  [53.6 kg (118 lb 2.7 oz)-58.5 kg (128 lb 15.5 oz)] 55.2 kg (121 lb 9.6 oz) (06/02 0512) Weight change: -1.817 kg (-4 lb 0.1 oz)  Intake/Output from previous day: 06/01 0701 - 06/02 0700 In: 340 [P.O.:340] Out: 1600  Intake/Output this shift: No intake/output data recorded.  General appearance: cooperative, no distress and slowed mentation Resp: diminished breath sounds bilaterally Chest wall: Ij PC Cardio: S1, S2 normal, systolic murmur: systolic ejection 2/6, decrescendo at 2nd left intercostal space and crisp valve sounds GI: pos bs, soft, liver down 5 cm Extremities: New R BKA, L BKA  Lab Results:  Recent Labs  07/10/16 0629 07/11/16 1449 07/11/16 1619  WBC 23.5* 23.9*  --   HGB 9.4* 7.1* 8.7*  HCT 26.9* 20.2* 24.7*  PLT 137* 162  --    BMET:  Recent Labs  07/10/16 0629 07/11/16 1450  NA 130* 128*  K 3.9 4.2  CL 96* 95*  CO2 23 23  GLUCOSE 166* 263*  BUN 6 12  CREATININE 3.39* 4.86*  CALCIUM 8.9 9.1   No results for input(s): PTH in the last 72 hours. Iron Studies: No results for input(s): IRON, TIBC, TRANSFERRIN, FERRITIN in the last 72 hours.  Studies/Results: No results found.  I have reviewed the patient's current medications.  Assessment/Plan: 1 ESRD stable had HD yest.  NEed to eval for new access 2 Anemia esa, erratic values 3 BKA /PVD per VVS 4 HPTH 5 Liver Tx check Prograf 6 Rehab P HD, esa, rehab, check Prograf    LOS: 2 days   Faylene Allerton L 07/12/2016,7:57 AM

## 2016-07-12 NOTE — Progress Notes (Signed)
Subjective/Complaints: Having a lot of pain in right leg (left leg too to a lesser extent)  ROS: pt denies nausea, vomiting, diarrhea, cough, shortness of breath or chest pain   Objective: Vital Signs: Blood pressure (!) 106/54, pulse 82, temperature 99.3 F (37.4 C), temperature source Oral, resp. rate 18, height _0  (1.626 m), weight 55.2 kg (121 lb 9.6 oz), SpO2 95 %. No results found. Results for orders placed or performed during the hospital encounter of 07/10/16 (from the past 72 hour(s))  Glucose, capillary     Status: Abnormal   Collection Time: 07/10/16  4:58 PM  Result Value Ref Range   Glucose-Capillary 318 (H) 65 - 99 mg/dL  Heparin level (unfractionated)     Status: Abnormal   Collection Time: 07/10/16  7:46 PM  Result Value Ref Range   Heparin Unfractionated 0.28 (L) 0.30 - 0.70 IU/mL    Comment:        IF HEPARIN RESULTS ARE BELOW EXPECTED VALUES, AND PATIENT DOSAGE HAS BEEN CONFIRMED, SUGGEST FOLLOW UP TESTING OF ANTITHROMBIN III LEVELS.   Glucose, capillary     Status: Abnormal   Collection Time: 07/10/16  9:04 PM  Result Value Ref Range   Glucose-Capillary 194 (H) 65 - 99 mg/dL  Glucose, capillary     Status: Abnormal   Collection Time: 07/11/16  6:20 AM  Result Value Ref Range   Glucose-Capillary 155 (H) 65 - 99 mg/dL  Heparin level (unfractionated)     Status: None   Collection Time: 07/11/16 10:06 AM  Result Value Ref Range   Heparin Unfractionated 0.44 0.30 - 0.70 IU/mL    Comment:        IF HEPARIN RESULTS ARE BELOW EXPECTED VALUES, AND PATIENT DOSAGE HAS BEEN CONFIRMED, SUGGEST FOLLOW UP TESTING OF ANTITHROMBIN III LEVELS.   Protime-INR     Status: Abnormal   Collection Time: 07/11/16 10:06 AM  Result Value Ref Range   Prothrombin Time 25.9 (H) 11.4 - 15.2 seconds   INR 2.32   Glucose, capillary     Status: Abnormal   Collection Time: 07/11/16 11:52 AM  Result Value Ref Range   Glucose-Capillary 141 (H) 65 - 99 mg/dL  CBC      Status: Abnormal   Collection Time: 07/11/16  2:49 PM  Result Value Ref Range   WBC 23.9 (H) 4.0 - 10.5 K/uL    Comment: WHITE COUNT CONFIRMED ON SMEAR   RBC 2.41 (L) 3.87 - 5.11 MIL/uL   Hemoglobin 7.1 (L) 12.0 - 15.0 g/dL    Comment: REPEATED TO VERIFY   HCT 20.2 (L) 36.0 - 46.0 %   MCV 83.8 78.0 - 100.0 fL   MCH 29.5 26.0 - 34.0 pg   MCHC 35.1 30.0 - 36.0 g/dL   RDW 20.1 (H) 11.5 - 15.5 %   Platelets 162 150 - 400 K/uL    Comment: PLATELET COUNT CONFIRMED BY SMEAR  Renal function panel     Status: Abnormal   Collection Time: 07/11/16  2:50 PM  Result Value Ref Range   Sodium 128 (L) 135 - 145 mmol/L   Potassium 4.2 3.5 - 5.1 mmol/L   Chloride 95 (L) 101 - 111 mmol/L   CO2 23 22 - 32 mmol/L   Glucose, Bld 263 (H) 65 - 99 mg/dL   BUN 12 6 - 20 mg/dL   Creatinine, Ser 4.86 (H) 0.44 - 1.00 mg/dL   Calcium 9.1 8.9 - 10.3 mg/dL   Phosphorus 6.8 (H) 2.5 -  4.6 mg/dL   Albumin 1.6 (L) 3.5 - 5.0 g/dL   GFR calc non Af Amer 9 (L) >60 mL/min   GFR calc Af Amer 10 (L) >60 mL/min    Comment: (NOTE) The eGFR has been calculated using the CKD EPI equation. This calculation has not been validated in all clinical situations. eGFR's persistently <60 mL/min signify possible Chronic Kidney Disease.    Anion gap 10 5 - 15  Hemoglobin and hematocrit, blood     Status: Abnormal   Collection Time: 07/11/16  4:19 PM  Result Value Ref Range   Hemoglobin 8.7 (L) 12.0 - 15.0 g/dL   HCT 24.7 (L) 36.0 - 46.0 %  Glucose, capillary     Status: Abnormal   Collection Time: 07/11/16  7:03 PM  Result Value Ref Range   Glucose-Capillary 46 (L) 65 - 99 mg/dL  Glucose, capillary     Status: Abnormal   Collection Time: 07/11/16  7:25 PM  Result Value Ref Range   Glucose-Capillary 103 (H) 65 - 99 mg/dL  Glucose, capillary     Status: Abnormal   Collection Time: 07/11/16 10:30 PM  Result Value Ref Range   Glucose-Capillary 251 (H) 65 - 99 mg/dL  Heparin level (unfractionated)     Status: None    Collection Time: 07/12/16  6:41 AM  Result Value Ref Range   Heparin Unfractionated 0.34 0.30 - 0.70 IU/mL    Comment:        IF HEPARIN RESULTS ARE BELOW EXPECTED VALUES, AND PATIENT DOSAGE HAS BEEN CONFIRMED, SUGGEST FOLLOW UP TESTING OF ANTITHROMBIN III LEVELS.   Protime-INR     Status: Abnormal   Collection Time: 07/12/16  6:41 AM  Result Value Ref Range   Prothrombin Time 28.1 (H) 11.4 - 15.2 seconds   INR 2.57   Glucose, capillary     Status: Abnormal   Collection Time: 07/12/16  6:53 AM  Result Value Ref Range   Glucose-Capillary 181 (H) 65 - 99 mg/dL     HEENT: normal and poor dentition Cardio:RRR Resp:CTA B GI: BS positive Extremity:  Edema R BKA stump Skin:   Other Left BK with small lateral incision line scab which is coming loose, R BKA bandaged--intact Neuro: Alert/Oriented and Abnormal Motor 4+/5 in BUE and L HF, R HF 3,  3- B KE Musc/Skel:  Both limbs are sens to touch/ROM Gen NAD   Assessment/Plan: 1. Functional deficits secondary to New right and recent left BKA which require 3+ hours per day of interdisciplinary therapy in a comprehensive inpatient rehab setting. Physiatrist is providing close team supervision and 24 hour management of active medical problems listed below. Physiatrist and rehab team continue to assess barriers to discharge/monitor patient progress toward functional and medical goals. FIM: Function - Bathing Position: Sitting EOB Body parts bathed by patient: Right arm, Left arm, Chest, Abdomen, Front perineal area, Buttocks, Right upper leg, Left upper leg Body parts bathed by helper: Back Bathing not applicable: Left lower leg, Right lower leg Assist Level: Supervision or verbal cues  Function- Upper Body Dressing/Undressing What is the patient wearing?: Hospital gown (IV continuous) Function - Lower Body Dressing/Undressing What is the patient wearing?: Underwear Position: Sitting EOB Underwear - Performed by patient:  Thread/unthread right underwear leg, Thread/unthread left underwear leg, Pull underwear up/down Assist for lower body dressing: Supervision or verbal cues  Function - Toileting Toileting activity did not occur: N/A Toileting steps completed by patient: Performs perineal hygiene Toileting steps completed by helper: Adjust clothing  prior to toileting, Adjust clothing after toileting Assist level:  (MAX A)  Function - Toilet Transfers Toilet transfer activity did not occur: N/A Toilet transfer assistive device: Elevated toilet seat/BSC over toilet Assist level to toilet: Moderate assist (Pt 50 - 74%/lift or lower) Assist level from toilet: Moderate assist (Pt 50 - 74%/lift or lower)  Function - Chair/bed transfer Chair/bed transfer activity did not occur: N/A Chair/bed transfer method: Lateral scoot Chair/bed transfer assist level: Maximal assist (Pt 25 - 49%/lift and lower) Chair/bed transfer assistive device: Sliding board, Armrests  Function - Locomotion: Wheelchair Type: Manual Max wheelchair distance: 145f  Assist Level: Touching or steadying assistance (Pt > 75%) Assist Level: Supervision or verbal cues Assist Level: Touching or steadying assistance (Pt > 75%) Turns around,maneuvers to table,bed, and toilet,negotiates 3% grade,maneuvers on rugs and over doorsills: No Function - Locomotion: Ambulation Ambulation activity did not occur: Safety/medical concerns Walk 10 feet activity did not occur: Safety/medical concerns Walk 50 feet with 2 turns activity did not occur: Safety/medical concerns Walk 150 feet activity did not occur: Safety/medical concerns Walk 10 feet on uneven surfaces activity did not occur: Safety/medical concerns  Function - Comprehension Comprehension: Auditory Comprehension assist level: Understands basic 75 - 89% of the time/ requires cueing 10 - 24% of the time  Function - Expression Expression: Verbal Expression assist level: Expresses basic 50 - 74%  of the time/requires cueing 25 - 49% of the time. Needs to repeat parts of sentences.  Function - Social Interaction Social Interaction assist level: Interacts appropriately 50 - 74% of the time - May be physically or verbally inappropriate.  Function - Problem Solving Problem solving assist level: Solves basic 50 - 74% of the time/requires cueing 25 - 49% of the time  Function - Memory Memory assist level: Recognizes or recalls 50 - 74% of the time/requires cueing 25 - 49% of the time Patient normally able to recall (first 3 days only): Current season, That he or she is in a hospital    Medical Problem List and Plan:  1. 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  CIR PT, OT -pt has KI for RLE. Needs to use  -  2. DVT Prophylaxis/Anticoagulation: Chronic Coumadin therapy for history of mechanical aortic valve  3. Pain Management: Lyrica 75 mg daily, oxycodone and Robaxin as needed  -limit as possible given obvious sedation---encouraged massage/sensory feedback 4. Mood: Provide emotional support  5. Neuropsych: This patient is capable of making decisions on her own behalf.  6. Skin/Wound Care: Routine skin checks--local care to Right BK/ dressing to left BK  7. Fluids/Electrolytes/Nutrition: Routine I&O with follow-up chemistries  8. ESRD. Continue dialysis as directed. HD to occur at the end of the day to allow for participation in rehab activities per Nephro 9. Leukocytosis. 23.9 on 6/1---follow up with HD 10. Acute on chronic anemia. Continue Aranesp. Follow-up CBC  11. Hypertension. Lopressor 25 mg twice a day. Monitor with increased mobility  12. CAD/AVR. Continue chronic Coumadin  13. History of liver transplant. Continue Prograf  14. Diabetes mellitus of peripheral neuropathy. Hemoglobin A1c 5.7.Bouts of hypoglycemia with sliding scale discontinued   Started amaryl--observe today CBG (last 3)   Recent Labs   07/11/16 1925 07/11/16 2230 07/12/16 0653  GLUCAP 103* 251* 181*    15. Hypothyroidism. Synthroid  16. Constipation. Laxative assistance    LOS (Days) 2 A FACE TO FACE EVALUATION WAS PERFORMED  SWARTZ,ZACHARY T 07/12/2016, 9:25 AM

## 2016-07-13 LAB — COMPREHENSIVE METABOLIC PANEL
ALK PHOS: 352 U/L — AB (ref 38–126)
ALT: 21 U/L (ref 14–54)
ANION GAP: 12 (ref 5–15)
AST: 79 U/L — ABNORMAL HIGH (ref 15–41)
Albumin: 1.6 g/dL — ABNORMAL LOW (ref 3.5–5.0)
BUN: 10 mg/dL (ref 6–20)
CALCIUM: 8.9 mg/dL (ref 8.9–10.3)
CO2: 23 mmol/L (ref 22–32)
Chloride: 95 mmol/L — ABNORMAL LOW (ref 101–111)
Creatinine, Ser: 4.69 mg/dL — ABNORMAL HIGH (ref 0.44–1.00)
GFR calc non Af Amer: 9 mL/min — ABNORMAL LOW (ref >60)
GFR, EST AFRICAN AMERICAN: 11 mL/min — AB (ref >60)
GLUCOSE: 125 mg/dL — AB (ref 65–99)
POTASSIUM: 3.6 mmol/L (ref 3.5–5.1)
SODIUM: 130 mmol/L — AB (ref 135–145)
Total Bilirubin: 4 mg/dL — ABNORMAL HIGH (ref 0.3–1.2)
Total Protein: 6.4 g/dL — ABNORMAL LOW (ref 6.5–8.1)

## 2016-07-13 LAB — GLUCOSE, CAPILLARY
GLUCOSE-CAPILLARY: 196 mg/dL — AB (ref 65–99)
Glucose-Capillary: 116 mg/dL — ABNORMAL HIGH (ref 65–99)
Glucose-Capillary: 109 mg/dL — ABNORMAL HIGH (ref 65–99)
Glucose-Capillary: 175 mg/dL — ABNORMAL HIGH (ref 65–99)

## 2016-07-13 LAB — CBC
HCT: 22.7 % — ABNORMAL LOW (ref 36.0–46.0)
HEMOGLOBIN: 7.9 g/dL — AB (ref 12.0–15.0)
MCH: 28.9 pg (ref 26.0–34.0)
MCHC: 34.8 g/dL (ref 30.0–36.0)
MCV: 83.2 fL (ref 78.0–100.0)
PLATELETS: 152 10*3/uL (ref 150–400)
RBC: 2.73 MIL/uL — AB (ref 3.87–5.11)
RDW: 21.3 % — ABNORMAL HIGH (ref 11.5–15.5)
WBC: 12.9 10*3/uL — AB (ref 4.0–10.5)

## 2016-07-13 LAB — PROTIME-INR
INR: 3.14
PROTHROMBIN TIME: 33 s — AB (ref 11.4–15.2)

## 2016-07-13 LAB — PHOSPHORUS: Phosphorus: 6.1 mg/dL — ABNORMAL HIGH (ref 2.5–4.6)

## 2016-07-13 LAB — HEPARIN LEVEL (UNFRACTIONATED): HEPARIN UNFRACTIONATED: 0.47 [IU]/mL (ref 0.30–0.70)

## 2016-07-13 MED ORDER — DARBEPOETIN ALFA 200 MCG/0.4ML IJ SOSY
200.0000 ug | PREFILLED_SYRINGE | INTRAMUSCULAR | Status: DC
  Administered 2016-07-14: 200 ug via INTRAVENOUS
  Filled 2016-07-13 (×2): qty 0.4

## 2016-07-13 MED ORDER — WARFARIN SODIUM 1 MG PO TABS
1.0000 mg | ORAL_TABLET | Freq: Once | ORAL | Status: AC
  Administered 2016-07-13: 1 mg via ORAL
  Filled 2016-07-13: qty 1

## 2016-07-13 NOTE — Progress Notes (Signed)
Subjective: Interval History: has no complaint , much clearer with thinking.  Objective: Vital signs in last 24 hours: Temp:  [97.6 F (36.4 C)-98.5 F (36.9 C)] 98.5 F (36.9 C) (06/03 0524) Pulse Rate:  [80-83] 83 (06/03 0524) Resp:  [18] 18 (06/03 0524) BP: (106-130)/(53-63) 122/63 (06/03 0524) SpO2:  [100 %] 100 % (06/03 0524) Weight:  [54.2 kg (119 lb 8 oz)] 54.2 kg (119 lb 8 oz) (06/03 0524) Weight change: -0.995 kg (-2 lb 3.1 oz)  Intake/Output from previous day: No intake/output data recorded. Intake/Output this shift: No intake/output data recorded.  General appearance: alert, cooperative and no distress Resp: diminished breath sounds bilaterally Chest wall: IJ cath Cardio: S1, S2 normal, systolic murmur: systolic ejection 2/6, decrescendo at 2nd left intercostal space and crisp valve sounds GI: at 2nd left intercostal space Extremities: bilat BKA, new on R  Lab Results:  Recent Labs  07/11/16 1449 07/11/16 1619  WBC 23.9*  --   HGB 7.1* 8.7*  HCT 20.2* 24.7*  PLT 162  --    BMET:  Recent Labs  07/11/16 1450  NA 128*  K 4.2  CL 95*  CO2 23  GLUCOSE 263*  BUN 12  CREATININE 4.86*  CALCIUM 9.1   No results for input(s): PTH in the last 72 hours. Iron Studies: No results for input(s): IRON, TIBC, TRANSFERRIN, FERRITIN in the last 72 hours.  Studies/Results: No results found.  I have reviewed the patient's current medications.  Assessment/Plan: 1 ESRD for HD in am.  Access in future 2 DM controlled 3 Anemia esa 4 PVD stump per VVS and Rehab 5 AVR 6 Liver TX prograf Pending P HD tomorrow, esa, Prograf level     LOS: 3 days   Rock Sobol L 07/13/2016,7:45 AM

## 2016-07-13 NOTE — Progress Notes (Signed)
Patient called for pain medicine 1.5 hours before next dose available. Provided emotional support, changed dressing and offered patient music. Patient selected gospel channel and listened for 1 hour. Patient stated pain level decreased from 10 to 7 "with the music" Verbalized appreciation for this intervention. Pain medication given when available.

## 2016-07-13 NOTE — Progress Notes (Signed)
Subjective/Complaints: Right leg still painful. Had questions about heparin and whether this could be stopped.   ROS: pt denies nausea, vomiting, diarrhea, cough, shortness of breath or chest pain   Objective: Vital Signs: Blood pressure (!) 115/55, pulse 83, temperature 98.5 F (36.9 C), temperature source Oral, resp. rate 18, height _0  (1.626 m), weight 54.2 kg (119 lb 8 oz), SpO2 100 %. No results found. Results for orders placed or performed during the hospital encounter of 07/10/16 (from the past 72 hour(s))  Glucose, capillary     Status: Abnormal   Collection Time: 07/10/16  4:58 PM  Result Value Ref Range   Glucose-Capillary 318 (H) 65 - 99 mg/dL  Heparin level (unfractionated)     Status: Abnormal   Collection Time: 07/10/16  7:46 PM  Result Value Ref Range   Heparin Unfractionated 0.28 (L) 0.30 - 0.70 IU/mL    Comment:        IF HEPARIN RESULTS ARE BELOW EXPECTED VALUES, AND PATIENT DOSAGE HAS BEEN CONFIRMED, SUGGEST FOLLOW UP TESTING OF ANTITHROMBIN III LEVELS.   Glucose, capillary     Status: Abnormal   Collection Time: 07/10/16  9:04 PM  Result Value Ref Range   Glucose-Capillary 194 (H) 65 - 99 mg/dL  Glucose, capillary     Status: Abnormal   Collection Time: 07/11/16  6:20 AM  Result Value Ref Range   Glucose-Capillary 155 (H) 65 - 99 mg/dL  Heparin level (unfractionated)     Status: None   Collection Time: 07/11/16 10:06 AM  Result Value Ref Range   Heparin Unfractionated 0.44 0.30 - 0.70 IU/mL    Comment:        IF HEPARIN RESULTS ARE BELOW EXPECTED VALUES, AND PATIENT DOSAGE HAS BEEN CONFIRMED, SUGGEST FOLLOW UP TESTING OF ANTITHROMBIN III LEVELS.   Protime-INR     Status: Abnormal   Collection Time: 07/11/16 10:06 AM  Result Value Ref Range   Prothrombin Time 25.9 (H) 11.4 - 15.2 seconds   INR 2.32   Glucose, capillary     Status: Abnormal   Collection Time: 07/11/16 11:52 AM  Result Value Ref Range   Glucose-Capillary 141 (H) 65 - 99  mg/dL  CBC     Status: Abnormal   Collection Time: 07/11/16  2:49 PM  Result Value Ref Range   WBC 23.9 (H) 4.0 - 10.5 K/uL    Comment: WHITE COUNT CONFIRMED ON SMEAR   RBC 2.41 (L) 3.87 - 5.11 MIL/uL   Hemoglobin 7.1 (L) 12.0 - 15.0 g/dL    Comment: REPEATED TO VERIFY   HCT 20.2 (L) 36.0 - 46.0 %   MCV 83.8 78.0 - 100.0 fL   MCH 29.5 26.0 - 34.0 pg   MCHC 35.1 30.0 - 36.0 g/dL   RDW 20.1 (H) 11.5 - 15.5 %   Platelets 162 150 - 400 K/uL    Comment: PLATELET COUNT CONFIRMED BY SMEAR  Renal function panel     Status: Abnormal   Collection Time: 07/11/16  2:50 PM  Result Value Ref Range   Sodium 128 (L) 135 - 145 mmol/L   Potassium 4.2 3.5 - 5.1 mmol/L   Chloride 95 (L) 101 - 111 mmol/L   CO2 23 22 - 32 mmol/L   Glucose, Bld 263 (H) 65 - 99 mg/dL   BUN 12 6 - 20 mg/dL   Creatinine, Ser 4.86 (H) 0.44 - 1.00 mg/dL   Calcium 9.1 8.9 - 10.3 mg/dL   Phosphorus 6.8 (H) 2.5 -  4.6 mg/dL   Albumin 1.6 (L) 3.5 - 5.0 g/dL   GFR calc non Af Amer 9 (L) >60 mL/min   GFR calc Af Amer 10 (L) >60 mL/min    Comment: (NOTE) The eGFR has been calculated using the CKD EPI equation. This calculation has not been validated in all clinical situations. eGFR's persistently <60 mL/min signify possible Chronic Kidney Disease.    Anion gap 10 5 - 15  Hemoglobin and hematocrit, blood     Status: Abnormal   Collection Time: 07/11/16  4:19 PM  Result Value Ref Range   Hemoglobin 8.7 (L) 12.0 - 15.0 g/dL   HCT 24.7 (L) 36.0 - 46.0 %  Glucose, capillary     Status: Abnormal   Collection Time: 07/11/16  7:03 PM  Result Value Ref Range   Glucose-Capillary 46 (L) 65 - 99 mg/dL  Glucose, capillary     Status: Abnormal   Collection Time: 07/11/16  7:25 PM  Result Value Ref Range   Glucose-Capillary 103 (H) 65 - 99 mg/dL  Glucose, capillary     Status: Abnormal   Collection Time: 07/11/16 10:30 PM  Result Value Ref Range   Glucose-Capillary 251 (H) 65 - 99 mg/dL  Heparin level (unfractionated)      Status: None   Collection Time: 07/12/16  6:41 AM  Result Value Ref Range   Heparin Unfractionated 0.34 0.30 - 0.70 IU/mL    Comment:        IF HEPARIN RESULTS ARE BELOW EXPECTED VALUES, AND PATIENT DOSAGE HAS BEEN CONFIRMED, SUGGEST FOLLOW UP TESTING OF ANTITHROMBIN III LEVELS.   Protime-INR     Status: Abnormal   Collection Time: 07/12/16  6:41 AM  Result Value Ref Range   Prothrombin Time 28.1 (H) 11.4 - 15.2 seconds   INR 2.57   Glucose, capillary     Status: Abnormal   Collection Time: 07/12/16  6:53 AM  Result Value Ref Range   Glucose-Capillary 181 (H) 65 - 99 mg/dL  Glucose, capillary     Status: Abnormal   Collection Time: 07/12/16 12:24 PM  Result Value Ref Range   Glucose-Capillary 196 (H) 65 - 99 mg/dL  Glucose, capillary     Status: Abnormal   Collection Time: 07/12/16  5:06 PM  Result Value Ref Range   Glucose-Capillary 175 (H) 65 - 99 mg/dL  Glucose, capillary     Status: Abnormal   Collection Time: 07/12/16  9:03 PM  Result Value Ref Range   Glucose-Capillary 204 (H) 65 - 99 mg/dL  Glucose, capillary     Status: Abnormal   Collection Time: 07/13/16  6:25 AM  Result Value Ref Range   Glucose-Capillary 116 (H) 65 - 99 mg/dL   Comment 1 Notify RN   Protime-INR     Status: Abnormal   Collection Time: 07/13/16  6:54 AM  Result Value Ref Range   Prothrombin Time 33.0 (H) 11.4 - 15.2 seconds   INR 3.14   Comprehensive metabolic panel     Status: Abnormal   Collection Time: 07/13/16  6:54 AM  Result Value Ref Range   Sodium 130 (L) 135 - 145 mmol/L   Potassium 3.6 3.5 - 5.1 mmol/L   Chloride 95 (L) 101 - 111 mmol/L   CO2 23 22 - 32 mmol/L   Glucose, Bld 125 (H) 65 - 99 mg/dL   BUN 10 6 - 20 mg/dL   Creatinine, Ser 4.69 (H) 0.44 - 1.00 mg/dL   Calcium 8.9 8.9 - 10.3  mg/dL   Total Protein 6.4 (L) 6.5 - 8.1 g/dL   Albumin 1.6 (L) 3.5 - 5.0 g/dL   AST 79 (H) 15 - 41 U/L   ALT 21 14 - 54 U/L   Alkaline Phosphatase 352 (H) 38 - 126 U/L   Total Bilirubin 4.0  (H) 0.3 - 1.2 mg/dL   GFR calc non Af Amer 9 (L) >60 mL/min   GFR calc Af Amer 11 (L) >60 mL/min    Comment: (NOTE) The eGFR has been calculated using the CKD EPI equation. This calculation has not been validated in all clinical situations. eGFR's persistently <60 mL/min signify possible Chronic Kidney Disease.    Anion gap 12 5 - 15  Phosphorus     Status: Abnormal   Collection Time: 07/13/16  6:54 AM  Result Value Ref Range   Phosphorus 6.1 (H) 2.5 - 4.6 mg/dL  CBC     Status: Abnormal   Collection Time: 07/13/16  6:54 AM  Result Value Ref Range   WBC 12.9 (H) 4.0 - 10.5 K/uL    Comment: WHITE COUNT CONFIRMED ON SMEAR   RBC 2.73 (L) 3.87 - 5.11 MIL/uL   Hemoglobin 7.9 (L) 12.0 - 15.0 g/dL   HCT 22.7 (L) 36.0 - 46.0 %   MCV 83.2 78.0 - 100.0 fL   MCH 28.9 26.0 - 34.0 pg   MCHC 34.8 30.0 - 36.0 g/dL   RDW 21.3 (H) 11.5 - 15.5 %   Platelets 152 150 - 400 K/uL    Comment: REPEATED TO VERIFY PLATELET COUNT CONFIRMED BY SMEAR   Heparin level (unfractionated)     Status: None   Collection Time: 07/13/16  6:54 AM  Result Value Ref Range   Heparin Unfractionated 0.47 0.30 - 0.70 IU/mL    Comment:        IF HEPARIN RESULTS ARE BELOW EXPECTED VALUES, AND PATIENT DOSAGE HAS BEEN CONFIRMED, SUGGEST FOLLOW UP TESTING OF ANTITHROMBIN III LEVELS.      HEENT: normal and poor dentition Cardio:RRR Resp:CTA B GI: BS positive Extremity:  Edema R BKA stump improved Skin:   Other Left BK with small lateral incision line scab which is almost loose, R BKA incision clean and intact with minimal serosanginous drainage Neuro: Alert/Oriented and Abnormal Motor 4+/5 in BUE and L HF, R HF 3,  3- B KE Musc/Skel:  Both limbs are sens to touch/ROM Gen NAD   Assessment/Plan: 1. Functional deficits secondary to New right and recent left BKA which require 3+ hours per day of interdisciplinary therapy in a comprehensive inpatient rehab setting. Physiatrist is providing close team supervision and  24 hour management of active medical problems listed below. Physiatrist and rehab team continue to assess barriers to discharge/monitor patient progress toward functional and medical goals. FIM: Function - Bathing Position: Sitting EOB Body parts bathed by patient: Right arm, Left arm, Chest, Abdomen, Front perineal area, Buttocks, Right upper leg, Left upper leg Body parts bathed by helper: Back Bathing not applicable: Left lower leg, Right lower leg Assist Level: Supervision or verbal cues  Function- Upper Body Dressing/Undressing What is the patient wearing?: Hospital gown (IV continuous) Function - Lower Body Dressing/Undressing What is the patient wearing?: Underwear, Pants Position: Sitting EOB Underwear - Performed by patient: Thread/unthread right underwear leg, Thread/unthread left underwear leg, Pull underwear up/down Pants- Performed by patient: Thread/unthread right pants leg, Thread/unthread left pants leg, Pull pants up/down Assist for lower body dressing: Supervision or verbal cues  Function - Toileting Toileting activity  did not occur: N/A Toileting steps completed by patient: Adjust clothing prior to toileting, Performs perineal hygiene Toileting steps completed by helper: Adjust clothing after toileting Assist level:  (MAX A)  Function - Air cabin crew transfer activity did not occur: N/A Toilet transfer assistive device: Elevated toilet seat/BSC over toilet Assist level to toilet: Moderate assist (Pt 50 - 74%/lift or lower) Assist level from toilet: Touching or steadying assistance (Pt > 75%)  Function - Chair/bed transfer Chair/bed transfer activity did not occur: N/A Chair/bed transfer method: Lateral scoot Chair/bed transfer assist level: Touching or steadying assistance (Pt > 75%) Chair/bed transfer assistive device: Armrests, Sliding board  Function - Locomotion: Wheelchair Type: Manual Max wheelchair distance: 180f  Assist Level: Supervision  or verbal cues Assist Level: Supervision or verbal cues Assist Level: Supervision or verbal cues Turns around,maneuvers to table,bed, and toilet,negotiates 3% grade,maneuvers on rugs and over doorsills: No Function - Locomotion: Ambulation Ambulation activity did not occur: Safety/medical concerns Walk 10 feet activity did not occur: Safety/medical concerns Walk 50 feet with 2 turns activity did not occur: Safety/medical concerns Walk 150 feet activity did not occur: Safety/medical concerns Walk 10 feet on uneven surfaces activity did not occur: Safety/medical concerns  Function - Comprehension Comprehension: Auditory Comprehension assist level: Understands basic 90% of the time/cues < 10% of the time  Function - Expression Expression: Verbal Expression assist level: Expresses basic 50 - 74% of the time/requires cueing 25 - 49% of the time. Needs to repeat parts of sentences.  Function - Social Interaction Social Interaction assist level: Interacts appropriately 50 - 74% of the time - May be physically or verbally inappropriate.  Function - Problem Solving Problem solving assist level: Solves basic 50 - 74% of the time/requires cueing 25 - 49% of the time  Function - Memory Memory assist level: Recognizes or recalls 50 - 74% of the time/requires cueing 25 - 49% of the time Patient normally able to recall (first 3 days only): Current season, That he or she is in a hospital    Medical Problem List and Plan:  1. 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  CIR PT, OT -pt has KI for RLE. Needs to use---doing better with right knee extension  -  2. DVT Prophylaxis/Anticoagulation: Chronic Coumadin therapy for history of mechanical aortic valve  3. Pain Management: Lyrica 75 mg daily, oxycodone and Robaxin as needed  -limit as possible given obvious sedation---encouraged massage/sensory feedback 4. Mood: Provide  emotional support  5. Neuropsych: This patient is capable of making decisions on her own behalf.  6. Skin/Wound Care: Dry dressing/ACE to right BK . Drainage decreasing. Local care to left BKA---almost healed  7. Fluids/Electrolytes/Nutrition: Routine I&O with follow-up chemistries  8. ESRD. Continue dialysis as directed. HD to occur at the end of the day to allow for participation in rehab activities per Nephro 9. Leukocytosis. 23.9 on 6/1---down to 12.9 today, no clinical signs of infection 10. Acute on chronic anemia. Continue Aranesp. Follow-up CBC  11. Hypertension. Lopressor 25 mg twice a day. Monitor with increased mobility  12. CAD/AVR. Continue chronic Coumadin. Can dc IV heparin today as INR 3.14 today after being 2.57 yesterday  -bleeding decreased from stump incision 13. History of liver transplant. Continue Prograf  14. Diabetes mellitus of peripheral neuropathy. Hemoglobin A1c 5.7.Bouts of hypoglycemia with sliding scale discontinued   Started amaryl--some improvement--follow for patterns CBG (last 3)   Recent Labs  07/12/16 1706  07/12/16 2103 07/13/16 0625  GLUCAP 175* 204* 116*    15. Hypothyroidism. Synthroid  16. Constipation. Laxative assistance    LOS (Days) 3 A FACE TO FACE EVALUATION WAS PERFORMED  SWARTZ,ZACHARY T 07/13/2016, 9:34 AM

## 2016-07-13 NOTE — IPOC Note (Signed)
Overall Plan of Care [facility]) Patient Details Name: [name] MRN: [id number] DOB: 1956/01/08  Admitting Diagnosis: BKA ESRD  Hospital Problems: Principal Problem:   Amputation of right lower extremity below knee ([place]) Active Problems:   End stage renal disease on dialysis ([name])   S/P liver transplant ([name])   Benign essential HTN   Leukocytosis   Type 2 diabetes mellitus with complication, with long-term current use of insulin ([name])     Functional Problem List: Nursing Edema, Endurance, Medication Management, Nutrition, Pain, Safety, Skin Integrity  PT Balance, Behavior, Endurance, Motor, Pain, Safety, Sensory, Skin Integrity  OT Balance, Cognition, Endurance, Pain, Safety, Skin Integrity  SLP    TR         Basic ADL's: OT Grooming, Bathing, Dressing, Toileting     Advanced  ADL's: OT       Transfers: PT Bed Mobility, Bed to Chair, Car, [name], [name], [name]: PT Emergency planning/management officer, Stairs     Additional Impairments: OT    SLP        TR      Anticipated Outcomes Item Anticipated Outcome  Self Feeding MOD I  Swallowing      Basic self-care  S-MIN A  Toileting  S   Bathroom Transfers MIN A  Bowel/Bladder  Supervision, HD-MWF  Transfers  Min assist with LRAD   Locomotion  Supervision level with WC   Communication     Cognition     Pain  <4 on a 0-10 pain scale  Safety/Judgment  Min-mod assist with transfers   Therapy Plan: PT Intensity: Minimum of 1-2 x/day ,45 to 90 minutes PT Frequency: 5 out of 7 days PT Duration Estimated Length of Stay: 13-16days  OT Intensity: Minimum of 1-2 x/day, 45 to 90 minutes OT Frequency: 5 out of 7 days OT Duration/Estimated Length of Stay: 12-14         Team Interventions: Nursing Interventions Patient/Family Education, Disease Management/Prevention, Pain Management, Medication Management, Skin Care/Wound Management, Discharge Planning, Psychosocial Support  PT  interventions Balance/vestibular training, Cognitive remediation/compensation, Community reintegration, Discharge planning, Disease management/prevention, DME/adaptive equipment instruction, Functional electrical stimulation, Functional mobility training, Neuromuscular re-education, Pain management, Patient/family education, Skin care/wound management, Psychosocial support, Splinting/orthotics, Therapeutic Activities, Therapeutic Exercise, UE/LE Strength taining/ROM, UE/LE Coordination activities, Wheelchair propulsion/positioning  OT Interventions Training and development officer, Cognitive remediation/compensation, Discharge planning, Disease mangement/prevention, DME/adaptive equipment instruction, Functional mobility training, Pain management, Patient/family education, Psychosocial support, Self Care/advanced ADL retraining, Skin care/wound managment, Therapeutic Activities, Therapeutic Exercise, UE/LE Strength taining/ROM, UE/LE Coordination activities, Wheelchair propulsion/positioning  SLP Interventions    TR Interventions    SW/CM Interventions Discharge Planning, Psychosocial Support, Patient/Family Education    Team Discharge Planning: Destination: PT-Home ,OT- Home , SLP-  Projected Follow-up: PT-Home health PT, OT-  Home health OT, SLP-  Projected Equipment Needs: PT-To be determined, Sliding board, OT- To be determined, SLP-  Equipment Details: PT-Has WC , OT-  Patient/family involved in discharge planning: PT- Patient,  OT-Patient, SLP-   MD ELOS: 12-14 days Medical Rehab Prognosis:  Excellent Assessment: The patient has been admitted for CIR therapies with the diagnosis of right bka (previous left bka). The team will be addressing functional mobility, strength, stamina, balance, safety, adaptive techniques and equipment, self-care, bowel and bladder mgt, patient and caregiver education, sliding board transfers, w/c mobility, pain control, stump preparation, community reintegration. Goals  have been set at supervision to min assist for mobility and self-care tasks and ADLs. Right knee pain/contracture  an issue at present   [name] Staggers, MD, [facility]      See Team Conference Notes for weekly updates to the plan of care

## 2016-07-13 NOTE — Progress Notes (Signed)
ANTICOAGULATION CONSULT NOTE - Follow-Up  Pharmacy Consult for Heparin + Warfarin Indication: mechanical AVR   Allergies  Allergen Reactions  . Acetaminophen Other (See Comments)    Liver transplant recipient   . Codeine Itching  . Mirtazapine Other (See Comments)    hallucination    Patient Measurements: Height: 5\' 4"  (162.6 cm) Weight: 119 lb 8 oz (54.2 kg) IBW/kg (Calculated) : 54.7 Heparin Dosing Weight: 52.6 kg  Vital Signs: Temp: 98.5 F (36.9 C) (06/03 0524) Temp Source: Oral (06/03 0524) BP: 115/55 (06/03 0756) Pulse Rate: 83 (06/03 0756)  Labs:  Recent Labs  07/11/16 1006  07/11/16 1449 07/11/16 1450 07/11/16 1619 07/12/16 0641 07/13/16 0654  HGB  --   < > 7.1*  --  8.7*  --  7.9*  HCT  --   --  20.2*  --  24.7*  --  22.7*  PLT  --   --  162  --   --   --  152  LABPROT 25.9*  --   --   --   --  28.1* 33.0*  INR 2.32  --   --   --   --  2.57 3.14  HEPARINUNFRC 0.44  --   --   --   --  0.34 0.47  CREATININE  --   --   --  4.86*  --   --  4.69*  < > = values in this interval not displayed.  Estimated Creatinine Clearance: 10.8 mL/min (A) (by C-G formula based on SCr of 4.69 mg/dL (H)).   Assessment: 88 YOF on warfarin PTA for mechanical AVR (INR goal 2.5-3.5) held for procedures, restarted coumadin 5/30, with heparin bridge until INR is therapeutic. INR up today, but therapeutic. Heparin bridge has been discontinued. Bleeding from BKA site is noted to have improved. CBC stable from yesterday. No other bleeding noted.   - Recent course of flagyl (5/22 > 5/30) - ESRD  * PTA warf regimen: 1.25 mg daily except 2.5 mg on Sundays, Tuesdays, Thursdays  Goal of Therapy:  Heparin level 0.3-0.7 units/ml INR 2.5-3.5 Monitor platelets by anticoagulation protocol: Yes   Plan:  - Warf 1 mg x 1 today - Monitor s/sx of bleeding  Dierdre Harness, [place], PharmD Clinical Pharmacy Resident [phone] (Pager) 07/13/2016 9:55 AM

## 2016-07-14 ENCOUNTER — Inpatient Hospital Stay (HOSPITAL_COMMUNITY): Payer: Medicare Other | Admitting: Occupational Therapy

## 2016-07-14 ENCOUNTER — Inpatient Hospital Stay (HOSPITAL_COMMUNITY): Payer: Medicare Other | Admitting: Physical Therapy

## 2016-07-14 DIAGNOSIS — Z992 Dependence on renal dialysis: Secondary | ICD-10-CM

## 2016-07-14 DIAGNOSIS — N186 End stage renal disease: Secondary | ICD-10-CM

## 2016-07-14 LAB — RENAL FUNCTION PANEL
ANION GAP: 11 (ref 5–15)
Albumin: 1.7 g/dL — ABNORMAL LOW (ref 3.5–5.0)
BUN: 20 mg/dL (ref 6–20)
CALCIUM: 8.9 mg/dL (ref 8.9–10.3)
CHLORIDE: 93 mmol/L — AB (ref 101–111)
CO2: 23 mmol/L (ref 22–32)
Creatinine, Ser: 6.35 mg/dL — ABNORMAL HIGH (ref 0.44–1.00)
GFR, EST AFRICAN AMERICAN: 7 mL/min — AB (ref >60)
GFR, EST NON AFRICAN AMERICAN: 6 mL/min — AB (ref >60)
Glucose, Bld: 207 mg/dL — ABNORMAL HIGH (ref 65–99)
Phosphorus: 7.1 mg/dL — ABNORMAL HIGH (ref 2.5–4.6)
Potassium: 3.8 mmol/L (ref 3.5–5.1)
Sodium: 127 mmol/L — ABNORMAL LOW (ref 135–145)

## 2016-07-14 LAB — CBC
HEMATOCRIT: 22.4 % — AB (ref 36.0–46.0)
HEMOGLOBIN: 7.8 g/dL — AB (ref 12.0–15.0)
MCH: 29.7 pg (ref 26.0–34.0)
MCHC: 34.8 g/dL (ref 30.0–36.0)
MCV: 85.2 fL (ref 78.0–100.0)
Platelets: 119 10*3/uL — ABNORMAL LOW (ref 150–400)
RBC: 2.63 MIL/uL — ABNORMAL LOW (ref 3.87–5.11)
RDW: 21.9 % — AB (ref 11.5–15.5)
WBC: 11.1 10*3/uL — ABNORMAL HIGH (ref 4.0–10.5)

## 2016-07-14 LAB — GLUCOSE, CAPILLARY
GLUCOSE-CAPILLARY: 84 mg/dL (ref 65–99)
GLUCOSE-CAPILLARY: 48 mg/dL — AB (ref 65–99)
GLUCOSE-CAPILLARY: 57 mg/dL — AB (ref 65–99)
GLUCOSE-CAPILLARY: 206 mg/dL — AB (ref 65–99)
Glucose-Capillary: 90 mg/dL (ref 65–99)

## 2016-07-14 LAB — PROTIME-INR
INR: 2.63
Prothrombin Time: 28.6 seconds — ABNORMAL HIGH (ref 11.4–15.2)

## 2016-07-14 MED ORDER — HEPARIN SODIUM (PORCINE) 1000 UNIT/ML DIALYSIS: 1000.0000 [IU] | INTRAMUSCULAR | Status: DC | PRN

## 2016-07-14 MED ORDER — ALTEPLASE 2 MG IJ SOLR: 2.0000 mg | Freq: Once | INTRAMUSCULAR | Status: DC | PRN

## 2016-07-14 MED ORDER — SODIUM CHLORIDE 0.9 % IV SOLN: 100.0000 mL | INTRAVENOUS | Status: DC | PRN

## 2016-07-14 MED ORDER — OXYCODONE HCL 5 MG PO TABS
ORAL_TABLET | ORAL | Status: AC
  Filled 2016-07-14: qty 1

## 2016-07-14 MED ORDER — HEPARIN SODIUM (PORCINE) 1000 UNIT/ML DIALYSIS
100.0000 [IU]/kg | INTRAMUSCULAR | Status: DC | PRN
  Filled 2016-07-14: qty 6

## 2016-07-14 MED ORDER — LIDOCAINE HCL (PF) 1 % IJ SOLN: 5.0000 mL | INTRAMUSCULAR | Status: DC | PRN

## 2016-07-14 MED ORDER — NORTRIPTYLINE HCL 10 MG PO CAPS
10.0000 mg | ORAL_CAPSULE | Freq: Every day | ORAL | Status: DC
  Administered 2016-07-14: 10 mg via ORAL
  Filled 2016-07-14: qty 1

## 2016-07-14 MED ORDER — CALCITRIOL 0.25 MCG PO CAPS
ORAL_CAPSULE | ORAL | Status: AC
  Filled 2016-07-14: qty 2

## 2016-07-14 MED ORDER — DARBEPOETIN ALFA 200 MCG/0.4ML IJ SOSY
PREFILLED_SYRINGE | INTRAMUSCULAR | Status: AC
  Filled 2016-07-14: qty 0.4

## 2016-07-14 MED ORDER — LIDOCAINE-PRILOCAINE 2.5-2.5 % EX CREA: 1.0000 "application " | TOPICAL_CREAM | CUTANEOUS | Status: DC | PRN

## 2016-07-14 MED ORDER — WARFARIN SODIUM 2 MG PO TABS
2.0000 mg | ORAL_TABLET | Freq: Once | ORAL | Status: AC
  Administered 2016-07-14: 2 mg via ORAL
  Filled 2016-07-14: qty 1

## 2016-07-14 MED ORDER — PENTAFLUOROPROP-TETRAFLUOROETH EX AERO: 1.0000 "application " | INHALATION_SPRAY | CUTANEOUS | Status: DC | PRN

## 2016-07-14 NOTE — Progress Notes (Signed)
Patient ID: [name], female   DOB: [birth year], 61 y.o.   MRN: [id number] Consult to do discuss access for hemodialysis. Currently on dialysis via a right IJ catheter. To our service from recent right leg amputation with Dr. Scot Dock.  She is very complicated past dialysis access history. She had a left forearm loop graft removed for infection. Had a right arm access ligated for severe steal. She had a left femoral loop graft that was ligated due to ulcerations and ischemia to her left foot. Had a recent right below-knee amputation is continue to have difficulty with severe pain associated with this.  On physical exam she does not have a palpable left radial pulse. Does have a palpable brachial pulse.  Fillet her best option would be a left arm AV graft. I do feel that she would be at significant risk for steal for this. I would not recommend right femoral loop placement with her recent below-knee amputation. She understands we will coordinate this on a nondialysis day. We will plan left arm AV graft on Thursday, 07/17/2016

## 2016-07-14 NOTE — Progress Notes (Signed)
ANTICOAGULATION CONSULT NOTE - Follow-Up  Pharmacy Consult for Heparin + Warfarin Indication: mechanical AVR   Allergies  Allergen Reactions  . Acetaminophen Other (See Comments)    Liver transplant recipient   . Codeine Itching  . Mirtazapine Other (See Comments)    hallucination    Patient Measurements: Height: 5\' 4"  (162.6 cm) Weight: 119 lb 12.8 oz (54.3 kg) IBW/kg (Calculated) : 54.7 Heparin Dosing Weight: 52.6 kg  Vital Signs: Temp: 98.7 F (37.1 C) (06/04 0433) Temp Source: Oral (06/04 0433) BP: 111/55 (06/04 0433) Pulse Rate: 79 (06/04 0433)  Labs:  Recent Labs  07/11/16 1449 07/11/16 1450 07/11/16 1619 07/12/16 0641 07/13/16 0654 07/14/16 0707  HGB 7.1*  --  8.7*  --  7.9* 7.8*  HCT 20.2*  --  24.7*  --  22.7* 22.4*  PLT 162  --   --   --  152 119*  LABPROT  --   --   --  28.1* 33.0* 28.6*  INR  --   --   --  2.57 3.14 2.63  HEPARINUNFRC  --   --   --  0.34 0.47  --   CREATININE  --  4.86*  --   --  4.69*  --     Estimated Creatinine Clearance: 10.8 mL/min (A) (by C-G formula based on SCr of 4.69 mg/dL (H)).   Assessment: 63 YOF on warfarin PTA for mechanical AVR (INR goal 2.5-3.5) held for procedures, restarted coumadin 5/30, INR 2.63 today. Hgb 7.8, pltc 119K, both are slightly trending down. No active bleeding noted per chart.  - Recent course of flagyl (5/22 > 5/30) - ESRD - PO intake improving  * PTA warf regimen: 1.25 mg daily except 2.5 mg on Sundays, Tuesdays, Thursdays  Goal of Therapy:  Heparin level 0.3-0.7 units/ml INR 2.5-3.5 Monitor platelets by anticoagulation protocol: Yes   Plan:  - Warfarin 2 mg x 1 today - Daily PT/INR  Maryanna Shape, PharmD, BCPS  Clinical Pharmacist  Pager: [phone]   07/14/2016 2:03 PM

## 2016-07-14 NOTE — Progress Notes (Signed)
Subjective/Complaints: No issues overnite except Phantom pain  ROS: pt denies nausea, vomiting, diarrhea, cough, shortness of breath or chest pain   Objective: Vital Signs: Blood pressure (!) 111/55, pulse 79, temperature 98.7 F (37.1 C), temperature source Oral, resp. rate 18, height 5' 4"  (1.626 m), weight 54.3 kg (119 lb 12.8 oz), SpO2 97 %. No results found. Results for orders placed or performed during the hospital encounter of 07/10/16 (from the past 72 hour(s))  Heparin level (unfractionated)     Status: None   Collection Time: 07/11/16 10:06 AM  Result Value Ref Range   Heparin Unfractionated 0.44 0.30 - 0.70 IU/mL    Comment:        IF HEPARIN RESULTS ARE BELOW EXPECTED VALUES, AND PATIENT DOSAGE HAS BEEN CONFIRMED, SUGGEST FOLLOW UP TESTING OF ANTITHROMBIN III LEVELS.   Protime-INR     Status: Abnormal   Collection Time: 07/11/16 10:06 AM  Result Value Ref Range   Prothrombin Time 25.9 (H) 11.4 - 15.2 seconds   INR 2.32   Glucose, capillary     Status: Abnormal   Collection Time: 07/11/16 11:52 AM  Result Value Ref Range   Glucose-Capillary 141 (H) 65 - 99 mg/dL  CBC     Status: Abnormal   Collection Time: 07/11/16  2:49 PM  Result Value Ref Range   WBC 23.9 (H) 4.0 - 10.5 K/uL    Comment: WHITE COUNT CONFIRMED ON SMEAR   RBC 2.41 (L) 3.87 - 5.11 MIL/uL   Hemoglobin 7.1 (L) 12.0 - 15.0 g/dL    Comment: REPEATED TO VERIFY   HCT 20.2 (L) 36.0 - 46.0 %   MCV 83.8 78.0 - 100.0 fL   MCH 29.5 26.0 - 34.0 pg   MCHC 35.1 30.0 - 36.0 g/dL   RDW 20.1 (H) 11.5 - 15.5 %   Platelets 162 150 - 400 K/uL    Comment: PLATELET COUNT CONFIRMED BY SMEAR  Renal function panel     Status: Abnormal   Collection Time: 07/11/16  2:50 PM  Result Value Ref Range   Sodium 128 (L) 135 - 145 mmol/L   Potassium 4.2 3.5 - 5.1 mmol/L   Chloride 95 (L) 101 - 111 mmol/L   CO2 23 22 - 32 mmol/L   Glucose, Bld 263 (H) 65 - 99 mg/dL   BUN 12 6 - 20 mg/dL   Creatinine, Ser 4.86 (H)  0.44 - 1.00 mg/dL   Calcium 9.1 8.9 - 10.3 mg/dL   Phosphorus 6.8 (H) 2.5 - 4.6 mg/dL   Albumin 1.6 (L) 3.5 - 5.0 g/dL   GFR calc non Af Amer 9 (L) >60 mL/min   GFR calc Af Amer 10 (L) >60 mL/min    Comment: (NOTE) The eGFR has been calculated using the CKD EPI equation. This calculation has not been validated in all clinical situations. eGFR's persistently <60 mL/min signify possible Chronic Kidney Disease.    Anion gap 10 5 - 15  Hemoglobin and hematocrit, blood     Status: Abnormal   Collection Time: 07/11/16  4:19 PM  Result Value Ref Range   Hemoglobin 8.7 (L) 12.0 - 15.0 g/dL   HCT 24.7 (L) 36.0 - 46.0 %  Glucose, capillary     Status: Abnormal   Collection Time: 07/11/16  7:03 PM  Result Value Ref Range   Glucose-Capillary 46 (L) 65 - 99 mg/dL  Glucose, capillary     Status: Abnormal   Collection Time: 07/11/16  7:25 PM  Result Value  Ref Range   Glucose-Capillary 103 (H) 65 - 99 mg/dL  Glucose, capillary     Status: Abnormal   Collection Time: 07/11/16 10:30 PM  Result Value Ref Range   Glucose-Capillary 251 (H) 65 - 99 mg/dL  Heparin level (unfractionated)     Status: None   Collection Time: 07/12/16  6:41 AM  Result Value Ref Range   Heparin Unfractionated 0.34 0.30 - 0.70 IU/mL    Comment:        IF HEPARIN RESULTS ARE BELOW EXPECTED VALUES, AND PATIENT DOSAGE HAS BEEN CONFIRMED, SUGGEST FOLLOW UP TESTING OF ANTITHROMBIN III LEVELS.   Protime-INR     Status: Abnormal   Collection Time: 07/12/16  6:41 AM  Result Value Ref Range   Prothrombin Time 28.1 (H) 11.4 - 15.2 seconds   INR 2.57   Glucose, capillary     Status: Abnormal   Collection Time: 07/12/16  6:53 AM  Result Value Ref Range   Glucose-Capillary 181 (H) 65 - 99 mg/dL  Glucose, capillary     Status: Abnormal   Collection Time: 07/12/16 12:24 PM  Result Value Ref Range   Glucose-Capillary 196 (H) 65 - 99 mg/dL  Glucose, capillary     Status: Abnormal   Collection Time: 07/12/16  5:06 PM   Result Value Ref Range   Glucose-Capillary 175 (H) 65 - 99 mg/dL  Glucose, capillary     Status: Abnormal   Collection Time: 07/12/16  9:03 PM  Result Value Ref Range   Glucose-Capillary 204 (H) 65 - 99 mg/dL  Glucose, capillary     Status: Abnormal   Collection Time: 07/13/16  6:25 AM  Result Value Ref Range   Glucose-Capillary 116 (H) 65 - 99 mg/dL   Comment 1 Notify RN   Protime-INR     Status: Abnormal   Collection Time: 07/13/16  6:54 AM  Result Value Ref Range   Prothrombin Time 33.0 (H) 11.4 - 15.2 seconds   INR 3.14   Comprehensive metabolic panel     Status: Abnormal   Collection Time: 07/13/16  6:54 AM  Result Value Ref Range   Sodium 130 (L) 135 - 145 mmol/L   Potassium 3.6 3.5 - 5.1 mmol/L   Chloride 95 (L) 101 - 111 mmol/L   CO2 23 22 - 32 mmol/L   Glucose, Bld 125 (H) 65 - 99 mg/dL   BUN 10 6 - 20 mg/dL   Creatinine, Ser 4.69 (H) 0.44 - 1.00 mg/dL   Calcium 8.9 8.9 - 10.3 mg/dL   Total Protein 6.4 (L) 6.5 - 8.1 g/dL   Albumin 1.6 (L) 3.5 - 5.0 g/dL   AST 79 (H) 15 - 41 U/L   ALT 21 14 - 54 U/L   Alkaline Phosphatase 352 (H) 38 - 126 U/L   Total Bilirubin 4.0 (H) 0.3 - 1.2 mg/dL   GFR calc non Af Amer 9 (L) >60 mL/min   GFR calc Af Amer 11 (L) >60 mL/min    Comment: (NOTE) The eGFR has been calculated using the CKD EPI equation. This calculation has not been validated in all clinical situations. eGFR's persistently <60 mL/min signify possible Chronic Kidney Disease.    Anion gap 12 5 - 15  Phosphorus     Status: Abnormal   Collection Time: 07/13/16  6:54 AM  Result Value Ref Range   Phosphorus 6.1 (H) 2.5 - 4.6 mg/dL  CBC     Status: Abnormal   Collection Time: 07/13/16  6:54 AM  Result Value Ref Range   WBC 12.9 (H) 4.0 - 10.5 K/uL    Comment: WHITE COUNT CONFIRMED ON SMEAR   RBC 2.73 (L) 3.87 - 5.11 MIL/uL   Hemoglobin 7.9 (L) 12.0 - 15.0 g/dL   HCT 22.7 (L) 36.0 - 46.0 %   MCV 83.2 78.0 - 100.0 fL   MCH 28.9 26.0 - 34.0 pg   MCHC 34.8 30.0 -  36.0 g/dL   RDW 21.3 (H) 11.5 - 15.5 %   Platelets 152 150 - 400 K/uL    Comment: REPEATED TO VERIFY PLATELET COUNT CONFIRMED BY SMEAR   Heparin level (unfractionated)     Status: None   Collection Time: 07/13/16  6:54 AM  Result Value Ref Range   Heparin Unfractionated 0.47 0.30 - 0.70 IU/mL    Comment:        IF HEPARIN RESULTS ARE BELOW EXPECTED VALUES, AND PATIENT DOSAGE HAS BEEN CONFIRMED, SUGGEST FOLLOW UP TESTING OF ANTITHROMBIN III LEVELS.   Glucose, capillary     Status: Abnormal   Collection Time: 07/13/16 11:57 AM  Result Value Ref Range   Glucose-Capillary 109 (H) 65 - 99 mg/dL   Comment 1 Notify RN   Glucose, capillary     Status: Abnormal   Collection Time: 07/13/16  5:06 PM  Result Value Ref Range   Glucose-Capillary 196 (H) 65 - 99 mg/dL   Comment 1 Notify RN   Glucose, capillary     Status: Abnormal   Collection Time: 07/13/16  9:10 PM  Result Value Ref Range   Glucose-Capillary 175 (H) 65 - 99 mg/dL  Protime-INR     Status: Abnormal   Collection Time: 07/14/16  7:07 AM  Result Value Ref Range   Prothrombin Time 28.6 (H) 11.4 - 15.2 seconds   INR 2.63   CBC     Status: Abnormal (Preliminary result)   Collection Time: 07/14/16  7:07 AM  Result Value Ref Range   WBC PENDING 4.0 - 10.5 K/uL   RBC 2.63 (L) 3.87 - 5.11 MIL/uL   Hemoglobin 7.8 (L) 12.0 - 15.0 g/dL   HCT 22.4 (L) 36.0 - 46.0 %   MCV 85.2 78.0 - 100.0 fL   MCH 29.7 26.0 - 34.0 pg   MCHC 34.8 30.0 - 36.0 g/dL   RDW 21.9 (H) 11.5 - 15.5 %   Platelets PENDING 150 - 400 K/uL  Glucose, capillary     Status: None   Collection Time: 07/14/16  7:11 AM  Result Value Ref Range   Glucose-Capillary 90 65 - 99 mg/dL     HEENT: normal and poor dentition Cardio:RRR Resp:CTA B GI: BS positive Extremity:  Edema R BKA stump improved Skin:   Other R BKA incision clean and intact with minimal serosanginous drainage on pad no active Neuro: Alert/Oriented and Abnormal Motor 4+/5 in BUE and L HF, R HF 3,   3- B KE Musc/Skel:  Both limbs are sens to touch/ROM Gen NAD   Assessment/Plan: 1. Functional deficits secondary to New right and recent left BKA which require 3+ hours per day of interdisciplinary therapy in a comprehensive inpatient rehab setting. Physiatrist is providing close team supervision and 24 hour management of active medical problems listed below. Physiatrist and rehab team continue to assess barriers to discharge/monitor patient progress toward functional and medical goals. FIM: Function - Bathing Position: Sitting EOB Body parts bathed by patient: Right arm, Left arm, Chest, Abdomen, Front perineal area, Buttocks, Right upper leg, Left upper leg Body  parts bathed by helper: Back Bathing not applicable: Left lower leg, Right lower leg Assist Level: Supervision or verbal cues  Function- Upper Body Dressing/Undressing What is the patient wearing?: Hospital gown (IV continuous) Function - Lower Body Dressing/Undressing What is the patient wearing?: Underwear, Pants Position: Sitting EOB Underwear - Performed by patient: Thread/unthread right underwear leg, Thread/unthread left underwear leg, Pull underwear up/down Pants- Performed by patient: Thread/unthread right pants leg, Thread/unthread left pants leg, Pull pants up/down Assist for lower body dressing: Supervision or verbal cues  Function - Toileting Toileting activity did not occur: No continent bowel/bladder event Toileting steps completed by patient: Adjust clothing prior to toileting, Performs perineal hygiene Toileting steps completed by helper: Adjust clothing after toileting Assist level:  (MAX A)  Function - Air cabin crew transfer activity did not occur:  (not attempted) Toilet transfer assistive device: Elevated toilet seat/BSC over toilet Assist level to toilet: Moderate assist (Pt 50 - 74%/lift or lower) Assist level from toilet: Touching or steadying assistance (Pt > 75%)  Function -  Chair/bed transfer Chair/bed transfer activity did not occur: N/A Chair/bed transfer method: Lateral scoot Chair/bed transfer assist level: Touching or steadying assistance (Pt > 75%) Chair/bed transfer assistive device: Armrests, Sliding board  Function - Locomotion: Wheelchair Type: Manual Max wheelchair distance: 138f  Assist Level: Supervision or verbal cues Assist Level: Supervision or verbal cues Assist Level: Supervision or verbal cues Turns around,maneuvers to table,bed, and toilet,negotiates 3% grade,maneuvers on rugs and over doorsills: No Function - Locomotion: Ambulation Ambulation activity did not occur: Safety/medical concerns Walk 10 feet activity did not occur: Safety/medical concerns Walk 50 feet with 2 turns activity did not occur: Safety/medical concerns Walk 150 feet activity did not occur: Safety/medical concerns Walk 10 feet on uneven surfaces activity did not occur: Safety/medical concerns  Function - Comprehension Comprehension: Auditory Comprehension assist level: Understands basic 90% of the time/cues < 10% of the time  Function - Expression Expression: Verbal Expression assist level: Expresses basic 90% of the time/requires cueing < 10% of the time.  Function - Social Interaction Social Interaction assist level: Interacts appropriately 75 - 89% of the time - Needs redirection for appropriate language or to initiate interaction.  Function - Problem Solving Problem solving assist level: Solves basic 90% of the time/requires cueing < 10% of the time  Function - Memory Memory assist level: Recognizes or recalls 75 - 89% of the time/requires cueing 10 - 24% of the time Patient normally able to recall (first 3 days only): Current season, That he or she is in a hospital    Medical Problem List and Plan:  1. 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  CIR PT, OT -pt has KI for RLE. Needs to  use---doing better with right knee extension  -  2. DVT Prophylaxis/Anticoagulation: Chronic Coumadin therapy for history of mechanical aortic valve  3. Pain Management: Lyrica 75 mg daily, oxycodone and Robaxin as needed  -limit as possible given obvious sedation---encouraged massage/sensory feedback 4. Mood: Provide emotional support  5. Neuropsych: This patient is capable of making decisions on her own behalf.  6. Skin/Wound Care: Dry dressing/ACE to right BK . Drainage decreasing. Local care to left BKA---almost healed  7. Fluids/Electrolytes/Nutrition: Routine I&O , reduced intake 25-50% meals, fluid intake >10067m8. ESRD. Continue dialysis as directed. HD to occur at the end of the day to allow for participation in rehab activities per Nephro 9. Leukocytosis. 23.9 on 6/1---down  to 12.9 today, no clinical signs of infection 10. Acute on chronic anemia. Continue Aranesp. Follow-up CBC Hgb stable 11. Hypertension. Lopressor 25 mg twice a day. Monitor with increased mobility  12. CAD/AVR. Continue chronic Coumadin per pharm protocol Small spots on abd pad 13. History of liver transplant. Continue Prograf  14. Diabetes mellitus of peripheral neuropathy. Hemoglobin A1c 5.7.Bouts of hypoglycemia with sliding scale discontinued   Started amaryl--some improvement--follow for patterns CBG (last 3)   Recent Labs  07/13/16 1706 07/13/16 2110 07/14/16 0711  GLUCAP 196* 175* 90    15. Hypothyroidism. Synthroid  16. Constipation. Laxative assistance    LOS (Days) 4 A FACE TO FACE EVALUATION WAS PERFORMED  KIRSTEINS,ANDREW E 07/14/2016, 8:16 AM

## 2016-07-14 NOTE — Progress Notes (Signed)
Physical Therapy Session Note  Patient Details  Name: [name] MRN: [id number] Date of Birth: [birth year]  Today's Date: 07/14/2016 PT Individual Time: 0800-0915 PT Individual Time Calculation (min): 75 min   Short Term Goals: Week 1:  PT Short Term Goal 1 (Week 1): STG=LTG to to estimated length of stay   Skilled Therapeutic Interventions/Progress Updates:  Pt presented in bed with nsg present to administer meds. Pt stating feeling weak and fatigued. Required modA with use of features supine to sit at EOB. Encouraged pt to eat a little breaksfast sitting at EOB as had not eaten anything. Pt with x 2 LOB leaning to R while sitting up eating breakfast. MD entered room for assessment and checking incision. After MD departure pt with significantly increased pain due to residual limb being unwrapped. PTA provided music intervention while awaiting nsg to re-dress wound. Pt more relaxed with music on and able to re-direct from pain. Once residual limb re-dressed pt performed supine to sit modA for trunk control. Pt performed a-p transfer per request due to fatigue and required modA due to decreased UE strength. Once positioned in chair pt educated on increasing RLE knee ext and pt performed qs x 10 with PTA providing manual overpressure x 10 sec. Pt encouraged to maintain extension until following session (30 min). Pt in w/c with call bell within reach and needs met.      Therapy Documentation Precautions:  Precautions Precautions: Fall Restrictions Weight Bearing Restrictions: Yes RLE Weight Bearing: Non weight bearing LLE Weight Bearing: Non weight bearing General:   Vital Signs:  Pain: Pain Assessment Pain Assessment: 0-10 Pain Score: 8  Pain Type: Acute pain;Neuropathic pain;Phantom pain Pain Location: Leg Pain Orientation: Right Pain Descriptors / Indicators: Burning Pain Frequency: Constant Pain Onset: On-going Pain Intervention(s): Medication (See eMAR)   See Function  Navigator for Current Functional Status.   Therapy/Group: Individual Therapy  [name] Mcmonigle  [name], PTA  07/14/2016, 11:14 AM

## 2016-07-14 NOTE — Significant Event (Signed)
Hypoglycemic Event  CBG: 57  Treatment: 15 GM carbohydrate snack  Symptoms: Hungry  Follow-up CBG: Time:1903 CBG Result:48  Possible Reasons for Event: Inadequate meal intake  Comments/MD notified:Dan Angiulli     Barrett Shell

## 2016-07-14 NOTE — Progress Notes (Signed)
Occupational Therapy Session Note  Patient Details  Name: [name] MRN: [id number] Date of Birth: 04/07/1955  Today's Date: 07/14/2016 OT Individual Time: 0945-1100 and 1300-1400 OT Individual Time Calculation (min): 75 min and 60 min   Short Term Goals: Week 1:  OT Short Term Goal 1 (Week 1): Pt will complete clothing managment prior to toileting with supervision OT Short Term Goal 2 (Week 1): Pt will complete lower body dressing with MIN A OT Short Term Goal 3 (Week 1): Pt will maintain dynamic sitting balance for 5 min with set up OT Short Term Goal 4 (Week 1): Pt will consistantly complete slide board transfers with MOD A (or less)  Skilled Therapeutic Interventions/Progress Updates:    Session One: Pt seen for OT session focusing on ADL re-training and functional transfers. Pt sitting up in w/c upon arrival, voicing increased phantom limb pain but agreeable to tx session. [name] RN made aware of pain and medication administered at end of session. She completed UB bathing/dressing at sink with set-up assist.  She completed A/P transfer to bariatric BSC, required max A with increased time for anterior transfer onto slightly uphill BSC with rest breaks required due to fatigue. She completed lateral leans for clothing management and hygiene. Posterior transfer completed back into w/c with steadying assist.  Following rest break, pt completed UE strengthening exercises utilizing level II (orange) theraband. Completed x10 chest expansion, diagonal up, and diagonal down. Rest breaks required throughout. Will provide more exercises and hand out at next tx session. Pt left seated in w/c at end of session, all needs in reach.   Session Two: Pt seen for OT session focusing on functional transfers. Pt asleep in w/c upon arrival, easily awoken though had slept through delivery of lunch and wanted to eat half of lunch. While pt eating, discussed d/c planning and home accessibility. She self  propelled w/c ~40 ft with supervision and VCs for effect propulsion technique. Pt quickly to fatigue and requesting assist for energy conservation. Completed sliding board transfer w/c <> low soft surface couch in simulation of home environment. Following assist to place board, pt required min A with VCs for head/hip relationship for transfer to couch as slightly declined transfer. Following rest, pt required mod A to return to w/c with VCs for hand placement throughout transfer.  [name] daughter present for session, discussed d/c planning including plans for toileting as pt and pt's daughter want her to use standard toilet vs BSC. Discussed pros/cons with this idea and need to plan for good and bad days and have different options depending on pt's fatigue and pain level. Will cont to practice standard toilet transfers and transfers to standard [facility]. Pt returned to room at end of session, completed min A sliding board transfer to EOB. Pt set up with meal tray and sitting EOB at end of session. NT made aware of pt's position and pt to call for nursing assist when lunch is completed. All needs in reach.   Therapy Documentation Precautions:  Precautions Precautions: Fall Restrictions Weight Bearing Restrictions: Yes RLE Weight Bearing: Non weight bearing LLE Weight Bearing: Non weight bearing Pain: Pain Assessment Pain Assessment: 0-10 Pain Score: 8  Pain Type: Acute pain;Surgical pain Pain Location: Leg Pain Orientation: Right Pain Descriptors / Indicators: Aching Pain Frequency: Constant Pain Onset: On-going Patients Stated Pain Goal: 2 Pain Intervention(s): Medication (See eMAR), RN aware, repositioned  See Function Navigator for Current Functional Status.   Therapy/Group: Individual Therapy  Lewis, Karnisha Lefebre C 07/14/2016,  7:15 AM

## 2016-07-14 NOTE — Progress Notes (Signed)
Subjective:  Co some phantom pain / for HD today   Objective Vital signs in last 24 hours: Vitals:   07/13/16 1423 07/13/16 2148 07/14/16 0433 07/14/16 0627  BP: (!) 110/55 (!) 125/57 (!) 111/55   Pulse: 90 82 79   Resp: 18  18   Temp: 97.5 F (36.4 C)  98.7 F (37.1 C)   TempSrc: Oral  Oral   SpO2: 95%  97%   Weight:    54.3 kg (119 lb 12.8 oz)  Height:       Weight change: 0.136 kg (4.8 oz)  Physical Exam: General: alert in wheelchair in Room Heart: RRR  1/6 sem lsb , no rub/ crisp valve sounds   Lungs: CTA Abdomen: soft nt, nd Extremities:Bilat BKA , New on R clean dry bandage   Dialysis Acces-R IJ Perm cath   Dialysis Orders:  [place] MWF - right IJ 3.5h 180F  BFR 450/A1.5x  2K/2Ca  Na Linear  EDW 50 kg No Heparin -Calcitriol 0.5 mcg PO q HD -Mircera 50 mcg IV q 4 weeks   Background: 61 y.o.femalewith ESRD on HD, HTN, DM, CAD, chronic back pain, 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 Scot Dock. 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 Arteriogram no surgically correctable dx.     Problem/Plan: 1. PAD/R foot gangrene/recent L BKA (not healed) - s/p R BKA 5/29  per primary - plan CIR admission  2. ESRD - MWF / Reck labs/ Using 3K bath  (3.6 yest ) 3. Anemia - Hgb 7.8 on  on  weekly Fe ; now max  Aranesp 200/ 5/27 - hgb  dropped  with surgery 4. Metabolic bone disease - Cont VDRA/Ca acetate binder  5. Severe protein malnutrition - Albumin 1.6 renal diet/vitamins/ supplements - on regular diet to promote intake- intake poor 6. S/p mechanical AVR - anticoagulation per pharmacy  7. H/o liver transplant - on Prograf 8. DM - per primary  9.   BP/volume - BP ok / still above EDW by bed weight /today wt 55.5kg anticipate lower EDW s/p BKA   [name] Haber, PA-C [facility] [phone] 07/14/2016,11:38 AM  LOS: 4 days   Labs: Basic  Metabolic Panel:  Recent Labs Lab 07/10/16 0629 07/11/16 1450 07/13/16 0654  NA 130* 128* 130*  K 3.9 4.2 3.6  CL 96* 95* 95*  CO2 23 23 23   GLUCOSE 166* 263* 125*  BUN 6 12 10   CREATININE 3.39* 4.86* 4.69*  CALCIUM 8.9 9.1 8.9  PHOS 4.3 6.8* 6.1*   Liver Function Tests:  Recent Labs Lab 07/10/16 0629 07/11/16 1450 07/13/16 0654  AST  --   --  79*  ALT  --   --  21  ALKPHOS  --   --  352*  BILITOT  --   --  4.0*  PROT  --   --  6.4*  ALBUMIN 1.8* 1.6* 1.6*   No results for input(s): LIPASE, AMYLASE in the last 168 hours. No results for input(s): AMMONIA in the last 168 hours. CBC:  Recent Labs Lab 07/09/16 0938 07/10/16 0629 07/11/16 1449 07/11/16 1619 07/13/16 0654 07/14/16 0707  WBC 20.6* 23.5* 23.9*  --  12.9* 11.1*  HGB 8.4* 9.4* 7.1* 8.7* 7.9* 7.8*  HCT 24.1* 26.9* 20.2* 24.7* 22.7* 22.4*  MCV 82.0 83.0 83.8  --  83.2 85.2  PLT 135* 137* 162  --  152 119*  Cardiac Enzymes: No results for input(s): CKTOTAL, CKMB, CKMBINDEX, TROPONINI in the last 168 hours. CBG:  Recent Labs Lab 07/13/16 0625 07/13/16 1157 07/13/16 1706 07/13/16 2110 07/14/16 0711  GLUCAP 116* 109* 196* 175* 90    Studies/Results: No results found. Medications: . ferric gluconate (FERRLECIT/NULECIT) IV     . aspirin EC  81 mg Oral Daily  . calcitRIOL  0.5 mcg Oral Q M,W,F-HD  . calcium acetate  667 mg Oral TID WC  . darbepoetin (ARANESP) injection - DIALYSIS  200 mcg Intravenous Q Mon-HD  . docusate sodium  100 mg Oral Daily  . feeding supplement  1 Container Oral TID BM  . levothyroxine  175 mcg Oral QAC breakfast  . metoCLOPramide  5 mg Oral BID AC  . metoprolol tartrate  12.5 mg Oral BID  . multivitamin  1 tablet Oral QHS  . nortriptyline  10 mg Oral QHS  . polyethylene glycol  17 g Oral BID  . pregabalin  75 mg Oral Daily  . senna-docusate  1 tablet Oral BID  . tacrolimus  2 mg Oral BID  . Warfarin - Pharmacist Dosing Inpatient   Does not apply [phone]

## 2016-07-15 ENCOUNTER — Inpatient Hospital Stay (HOSPITAL_COMMUNITY): Payer: Medicare Other | Admitting: Occupational Therapy

## 2016-07-15 ENCOUNTER — Inpatient Hospital Stay (HOSPITAL_COMMUNITY): Payer: Medicare Other | Admitting: Physical Therapy

## 2016-07-15 LAB — CBC
HEMATOCRIT: 22.8 % — AB (ref 36.0–46.0)
HEMOGLOBIN: 7.8 g/dL — AB (ref 12.0–15.0)
MCH: 29.4 pg (ref 26.0–34.0)
MCHC: 34.2 g/dL (ref 30.0–36.0)
MCV: 86 fL (ref 78.0–100.0)
Platelets: 154 10*3/uL (ref 150–400)
RBC: 2.65 MIL/uL — ABNORMAL LOW (ref 3.87–5.11)
RDW: 22 % — ABNORMAL HIGH (ref 11.5–15.5)
WBC: 11.7 10*3/uL — ABNORMAL HIGH (ref 4.0–10.5)

## 2016-07-15 LAB — PROTIME-INR
INR: 1.9
PROTHROMBIN TIME: 22.1 s — AB (ref 11.4–15.2)

## 2016-07-15 LAB — GLUCOSE, CAPILLARY
GLUCOSE-CAPILLARY: 167 mg/dL — AB (ref 65–99)
GLUCOSE-CAPILLARY: 193 mg/dL — AB (ref 65–99)
GLUCOSE-CAPILLARY: 193 mg/dL — AB (ref 65–99)

## 2016-07-15 LAB — TACROLIMUS LEVEL: TACROLIMUS (FK506) - LABCORP: 3.8 ng/mL (ref 2.0–20.0)

## 2016-07-15 MED ORDER — WARFARIN SODIUM 3 MG PO TABS
3.0000 mg | ORAL_TABLET | Freq: Once | ORAL | Status: AC
  Administered 2016-07-15: 3 mg via ORAL
  Filled 2016-07-15: qty 1

## 2016-07-15 MED ORDER — GABAPENTIN 600 MG PO TABS: 300.0000 mg | ORAL_TABLET | Freq: Every day | ORAL | Status: DC

## 2016-07-15 MED ORDER — GABAPENTIN 300 MG PO CAPS
300.0000 mg | ORAL_CAPSULE | Freq: Every day | ORAL | Status: DC
  Administered 2016-07-15 – 2016-07-23 (×9): 300 mg via ORAL
  Filled 2016-07-15 (×9): qty 1

## 2016-07-15 NOTE — Progress Notes (Signed)
Subjective:   Cont to have pain in RLE Stump, gabapentin added today HD yesterday, 1.9L UF, post weight 54.1kg bed weight Seen by Dr. Donnetta Hutching, limited access options, plan for LUE AVF 6/7 Has mech heart valve and TDC  Objective Vital signs in last 24 hours: Vitals:   07/14/16 1945 07/14/16 2130 07/15/16 0540 07/15/16 0837  BP: (!) 117/59 (!) 107/53 123/61 (!) 110/58  Pulse: (!) 58 96 74 72  Resp: 20  (!) 24   Temp: 97.9 F (36.6 C)  98.4 F (36.9 C)   TempSrc: Axillary  Oral   SpO2: (!) 88%     Weight:   54.1 kg (119 lb 4.3 oz)   Height:       Weight change: 0.559 kg (1 lb 3.7 oz)  Physical Exam: General: alert in wheelchair in Room Heart: RRR  1/6 sem lsb , no rub/ crisp valve sounds   Lungs: CTA Abdomen: soft nt, nd Extremities:Bilat BKA , New on R clean dry bandage   Dialysis Acces-R IJ Perm cath   Dialysis Orders:  [place] MWF - right IJ 3.5h 180F  BFR 450/A1.5x  2K/2Ca  Na Linear  EDW 50 kg No Heparin -Calcitriol 0.5 mcg PO q HD -Mircera 50 mcg IV q 4 weeks   Background: 61 y.o.femalewith ESRD on HD, HTN, DM, CAD, chronic back pain, 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 Scot Dock. 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 Arteriogram no surgically correctable dx.     Problem/Plan: 1. PAD/R foot gangrene/recent L BKA (not healed) - s/p R BKA 5/29  in CIR 2. ESRD - MWF via [place] for AVG LUE 6/7; HD tomorrow 3K, 2L 3. Anemia - max ESA, monitor 4. Metabolic bone disease - Cont VDRA/Ca acetate binder  5. Severe protein malnutrition - Renal diet/vitamins/ supplements - on regular diet to promote intake- intake poor 6. S/p mechanical AVR and TDC -- appreciate quick help of VVS- anticoagulation per pharmacy  7. H/o liver transplant - on Prograf 8. DM - per primary  9.   BP/volume - BP ok / still above EDW by bed weight cont to try to lower post  weights  R Corabelle Spackman MD 07/15/2016,9:31 AM  LOS: 5 days   Labs: Basic Metabolic Panel:  Recent Labs Lab 07/11/16 1450 07/13/16 0654 07/14/16 1530  NA 128* 130* 127*  K 4.2 3.6 3.8  CL 95* 95* 93*  CO2 23 23 23   GLUCOSE 263* 125* 207*  BUN 12 10 20   CREATININE 4.86* 4.69* 6.35*  CALCIUM 9.1 8.9 8.9  PHOS 6.8* 6.1* 7.1*   Liver Function Tests:  Recent Labs Lab 07/11/16 1450 07/13/16 0654 07/14/16 1530  AST  --  79*  --   ALT  --  21  --   ALKPHOS  --  352*  --   BILITOT  --  4.0*  --   PROT  --  6.4*  --   ALBUMIN 1.6* 1.6* 1.7*   No results for input(s): LIPASE, AMYLASE in the last 168 hours. No results for input(s): AMMONIA in the last 168 hours. CBC:  Recent Labs Lab 07/10/16 0629 07/11/16 1449  07/13/16 0654 07/14/16 0707 07/15/16 0508  WBC 23.5* 23.9*  --  12.9* 11.1* 11.7*  HGB 9.4* 7.1*  < > 7.9* 7.8* 7.8*  HCT 26.9* 20.2*  < > 22.7* 22.4* 22.8*  MCV 83.0 83.8  --  83.2 85.2  86.0  PLT 137* 162  --  152 119* 154  < > = values in this interval not displayed. Cardiac Enzymes: No results for input(s): CKTOTAL, CKMB, CKMBINDEX, TROPONINI in the last 168 hours. CBG:  Recent Labs Lab 07/14/16 0711 07/14/16 1137 07/14/16 1844 07/14/16 1903 07/14/16 1948  GLUCAP 90 84 57* 48* 206*    Studies/Results: No results found. Medications: . ferric gluconate (FERRLECIT/NULECIT) IV     . aspirin EC  81 mg Oral Daily  . calcitRIOL  0.5 mcg Oral Q M,W,F-HD  . calcium acetate  667 mg Oral TID WC  . darbepoetin (ARANESP) injection - DIALYSIS  200 mcg Intravenous Q Mon-HD  . docusate sodium  100 mg Oral Daily  . feeding supplement  1 Container Oral TID BM  . gabapentin  300 mg Oral QHS  . levothyroxine  175 mcg Oral QAC breakfast  . metoCLOPramide  5 mg Oral BID AC  . metoprolol tartrate  12.5 mg Oral BID  . multivitamin  1 tablet Oral QHS  . polyethylene glycol  17 g Oral BID  . senna-docusate  1 tablet Oral BID  . tacrolimus  2 mg Oral BID  .  Warfarin - Pharmacist Dosing Inpatient   Does not apply [phone]

## 2016-07-15 NOTE — Progress Notes (Signed)
   VASCULAR SURGERY ASSESSMENT & PLAN:   POD 7 s/p right BKA. This is healing nicely.  The small wound on the lateral aspect of the left BKA is also healing.  The patient was seen in consultation yesterday by Dr. Sherren Mocha Early for placement of new hemodialysis access. She is apparently scheduled for placement of a left arm graft on Thursday.   SUBJECTIVE:   No complaints.  PHYSICAL EXAM:   Vitals:   07/14/16 2130 07/15/16 0540 07/15/16 0837 07/15/16 1411  BP: (!) 107/53 123/61 (!) 110/58 (!) 102/57  Pulse: 96 74 72 79  Resp:  (!) 24  18  Temp:  98.4 F (36.9 C)  97.7 F (36.5 C)  TempSrc:  Oral  Oral  SpO2:    94%  Weight:  119 lb 4.3 oz (54.1 kg)    Height:       Right BKA inspected and is healing nicely. The small wound on the lateral aspect of her left BKA is healing also.  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 aspirin EC  81 mg Oral Daily  . calcitRIOL  0.5 mcg Oral Q M,W,F-HD  . calcium acetate  667 mg Oral TID WC  . darbepoetin (ARANESP) injection - DIALYSIS  200 mcg Intravenous Q Mon-HD  . docusate sodium  100 mg Oral Daily  . feeding supplement  1 Container Oral TID BM  . gabapentin  300 mg Oral QHS  . levothyroxine  175 mcg Oral QAC breakfast  . metoCLOPramide  5 mg  Oral BID AC  . metoprolol tartrate  12.5 mg Oral BID  . multivitamin  1 tablet Oral QHS  . polyethylene glycol  17 g Oral BID  . senna-docusate  1 tablet Oral BID  . tacrolimus  2 mg Oral BID  . warfarin  3 mg Oral ONCE-1800  . Warfarin - Pharmacist Dosing Inpatient   Does not apply [place]: [phone] Office: [phone] 07/15/2016

## 2016-07-15 NOTE — Progress Notes (Signed)
Occupational Therapy Session Note  Patient Details  Name: [name] MRN: [id number] Date of Birth: [birth year]  Today's Date: 07/15/2016 OT Individual Time: 1300-1400 OT Individual Time Calculation (min): 60 min  and Today's Date: 07/15/2016 OT Missed Time: 30 Minutes Missed Time Reason: Patient fatigue   Short Term Goals: Week 1:  OT Short Term Goal 1 (Week 1): Pt will complete clothing managment prior to toileting with supervision OT Short Term Goal 2 (Week 1): Pt will complete lower body dressing with MIN A OT Short Term Goal 3 (Week 1): Pt will maintain dynamic sitting balance for 5 min with set up OT Short Term Goal 4 (Week 1): Pt will consistantly complete slide board transfers with MOD A (or less)  Skilled Therapeutic Interventions/Progress Updates:    OT arrived at 11:00 for scheduled session. Pt asleep in w/c, difficult to arouse and unable to maintain open eyes when conversing. Pt requesting therapist to return later due to fatigue. Declined wanting to transfer to bed.  Therapist returned at 1300, pt awake and eating lunch from w/c, agreeable to tx session. She completed bathing/dressing tasks from w/c level at sink, able to complete lateral leans for clothing management from w/c level to pull pants down. She required increased assist for pulling pants back up. She was able to complete w/c push up for therapist to pull underwear up. She was positioned next to bed in w/c and able to complete lateral lean onto bed in order to pull pants up with min A from therapist. Grooming tasks completed mod I at sink.  Pt left seated in w/c at end of session, all needs in reach.  Discussed d/c planning throughout session. Pt voicing wanting to get more comfortable with sliding board transfers. Also discussed DME. PT received standard BSC on last admission, will now require drop arm BSC, and recommending bariatric BSC due to completing sliding board transfers to [facility]. Made CSW aware off issue.    Therapy Documentation Precautions:  Precautions Precautions: Fall Restrictions Weight Bearing Restrictions: Yes RLE Weight Bearing: Non weight bearing LLE Weight Bearing: Non weight bearing Pain: Pain Assessment Pain Assessment: 0-10 Pain Score: 5 Pain Type: Acute pain Pain Location: Leg Pain Orientation: Right Pain Descriptors / Indicators: Burning Pain Frequency: Constant Pain Onset: Gradual Pain Intervention(s):Repositioned, distraction, increased activity  See Function Navigator for Current Functional Status.   Therapy/Group: Individual Therapy  Lewis, Langston [place] C 07/15/2016, 7:08 AM

## 2016-07-15 NOTE — Progress Notes (Signed)
Subjective/Complaints: No issues overnite except Phantom pain which did not improve after nortriptyline  ROS: pt denies nausea, vomiting, diarrhea, cough, shortness of breath or chest pain   Objective: Vital Signs: Blood pressure (!) 110/58, pulse 72, temperature 98.4 F (36.9 C), temperature source Oral, resp. rate (!) 24, height _0  (1.626 m), weight 54.1 kg (119 lb 4.3 oz), SpO2 (!) 88 %. No results found. Results for orders placed or performed during the hospital encounter of 07/10/16 (from the past 72 hour(s))  Glucose, capillary     Status: Abnormal   Collection Time: 07/12/16 12:24 PM  Result Value Ref Range   Glucose-Capillary 196 (H) 65 - 99 mg/dL  Glucose, capillary     Status: Abnormal   Collection Time: 07/12/16  5:06 PM  Result Value Ref Range   Glucose-Capillary 175 (H) 65 - 99 mg/dL  Glucose, capillary     Status: Abnormal   Collection Time: 07/12/16  9:03 PM  Result Value Ref Range   Glucose-Capillary 204 (H) 65 - 99 mg/dL  Glucose, capillary     Status: Abnormal   Collection Time: 07/13/16  6:25 AM  Result Value Ref Range   Glucose-Capillary 116 (H) 65 - 99 mg/dL   Comment 1 Notify RN   Protime-INR     Status: Abnormal   Collection Time: 07/13/16  6:54 AM  Result Value Ref Range   Prothrombin Time 33.0 (H) 11.4 - 15.2 seconds   INR 3.14   Comprehensive metabolic panel     Status: Abnormal   Collection Time: 07/13/16  6:54 AM  Result Value Ref Range   Sodium 130 (L) 135 - 145 mmol/L   Potassium 3.6 3.5 - 5.1 mmol/L   Chloride 95 (L) 101 - 111 mmol/L   CO2 23 22 - 32 mmol/L   Glucose, Bld 125 (H) 65 - 99 mg/dL   BUN 10 6 - 20 mg/dL   Creatinine, Ser 4.69 (H) 0.44 - 1.00 mg/dL   Calcium 8.9 8.9 - 10.3 mg/dL   Total Protein 6.4 (L) 6.5 - 8.1 g/dL   Albumin 1.6 (L) 3.5 - 5.0 g/dL   AST 79 (H) 15 - 41 U/L   ALT 21 14 - 54 U/L   Alkaline Phosphatase 352 (H) 38 - 126 U/L   Total Bilirubin 4.0 (H) 0.3 - 1.2 mg/dL   GFR calc non Af Amer 9 (L) >60 mL/min    GFR calc Af Amer 11 (L) >60 mL/min    Comment: (NOTE) The eGFR has been calculated using the CKD EPI equation. This calculation has not been validated in all clinical situations. eGFR's persistently <60 mL/min signify possible Chronic Kidney Disease.    Anion gap 12 5 - 15  Phosphorus     Status: Abnormal   Collection Time: 07/13/16  6:54 AM  Result Value Ref Range   Phosphorus 6.1 (H) 2.5 - 4.6 mg/dL  CBC     Status: Abnormal   Collection Time: 07/13/16  6:54 AM  Result Value Ref Range   WBC 12.9 (H) 4.0 - 10.5 K/uL    Comment: WHITE COUNT CONFIRMED ON SMEAR   RBC 2.73 (L) 3.87 - 5.11 MIL/uL   Hemoglobin 7.9 (L) 12.0 - 15.0 g/dL   HCT 22.7 (L) 36.0 - 46.0 %   MCV 83.2 78.0 - 100.0 fL   MCH 28.9 26.0 - 34.0 pg   MCHC 34.8 30.0 - 36.0 g/dL   RDW 21.3 (H) 11.5 - 15.5 %   Platelets 152  150 - 400 K/uL    Comment: REPEATED TO VERIFY PLATELET COUNT CONFIRMED BY SMEAR   Heparin level (unfractionated)     Status: None   Collection Time: 07/13/16  6:54 AM  Result Value Ref Range   Heparin Unfractionated 0.47 0.30 - 0.70 IU/mL    Comment:        IF HEPARIN RESULTS ARE BELOW EXPECTED VALUES, AND PATIENT DOSAGE HAS BEEN CONFIRMED, SUGGEST FOLLOW UP TESTING OF ANTITHROMBIN III LEVELS.   Glucose, capillary     Status: Abnormal   Collection Time: 07/13/16 11:57 AM  Result Value Ref Range   Glucose-Capillary 109 (H) 65 - 99 mg/dL   Comment 1 Notify RN   Glucose, capillary     Status: Abnormal   Collection Time: 07/13/16  5:06 PM  Result Value Ref Range   Glucose-Capillary 196 (H) 65 - 99 mg/dL   Comment 1 Notify RN   Glucose, capillary     Status: Abnormal   Collection Time: 07/13/16  9:10 PM  Result Value Ref Range   Glucose-Capillary 175 (H) 65 - 99 mg/dL  Protime-INR     Status: Abnormal   Collection Time: 07/14/16  7:07 AM  Result Value Ref Range   Prothrombin Time 28.6 (H) 11.4 - 15.2 seconds   INR 2.63   CBC     Status: Abnormal   Collection Time: 07/14/16  7:07 AM   Result Value Ref Range   WBC 11.1 (H) 4.0 - 10.5 K/uL    Comment: WHITE COUNT CONFIRMED ON SMEAR   RBC 2.63 (L) 3.87 - 5.11 MIL/uL   Hemoglobin 7.8 (L) 12.0 - 15.0 g/dL   HCT 22.4 (L) 36.0 - 46.0 %   MCV 85.2 78.0 - 100.0 fL   MCH 29.7 26.0 - 34.0 pg   MCHC 34.8 30.0 - 36.0 g/dL   RDW 21.9 (H) 11.5 - 15.5 %   Platelets 119 (L) 150 - 400 K/uL    Comment: SPECIMEN CHECKED FOR CLOTS PLATELET COUNT CONFIRMED BY SMEAR   Glucose, capillary     Status: None   Collection Time: 07/14/16  7:11 AM  Result Value Ref Range   Glucose-Capillary 90 65 - 99 mg/dL  Glucose, capillary     Status: None   Collection Time: 07/14/16 11:37 AM  Result Value Ref Range   Glucose-Capillary 84 65 - 99 mg/dL  Renal function panel     Status: Abnormal   Collection Time: 07/14/16  3:30 PM  Result Value Ref Range   Sodium 127 (L) 135 - 145 mmol/L   Potassium 3.8 3.5 - 5.1 mmol/L   Chloride 93 (L) 101 - 111 mmol/L   CO2 23 22 - 32 mmol/L   Glucose, Bld 207 (H) 65 - 99 mg/dL   BUN 20 6 - 20 mg/dL   Creatinine, Ser 6.35 (H) 0.44 - 1.00 mg/dL   Calcium 8.9 8.9 - 10.3 mg/dL   Phosphorus 7.1 (H) 2.5 - 4.6 mg/dL   Albumin 1.7 (L) 3.5 - 5.0 g/dL   GFR calc non Af Amer 6 (L) >60 mL/min   GFR calc Af Amer 7 (L) >60 mL/min    Comment: (NOTE) The eGFR has been calculated using the CKD EPI equation. This calculation has not been validated in all clinical situations. eGFR's persistently <60 mL/min signify possible Chronic Kidney Disease.    Anion gap 11 5 - 15  Glucose, capillary     Status: Abnormal   Collection Time: 07/14/16  6:44 PM  Result  Value Ref Range   Glucose-Capillary 57 (L) 65 - 99 mg/dL  Glucose, capillary     Status: Abnormal   Collection Time: 07/14/16  7:03 PM  Result Value Ref Range   Glucose-Capillary 48 (L) 65 - 99 mg/dL  Glucose, capillary     Status: Abnormal   Collection Time: 07/14/16  7:48 PM  Result Value Ref Range   Glucose-Capillary 206 (H) 65 - 99 mg/dL  Protime-INR      Status: Abnormal   Collection Time: 07/15/16  5:08 AM  Result Value Ref Range   Prothrombin Time 22.1 (H) 11.4 - 15.2 seconds   INR 1.90   CBC     Status: Abnormal   Collection Time: 07/15/16  5:08 AM  Result Value Ref Range   WBC 11.7 (H) 4.0 - 10.5 K/uL   RBC 2.65 (L) 3.87 - 5.11 MIL/uL   Hemoglobin 7.8 (L) 12.0 - 15.0 g/dL   HCT 22.8 (L) 36.0 - 46.0 %   MCV 86.0 78.0 - 100.0 fL   MCH 29.4 26.0 - 34.0 pg   MCHC 34.2 30.0 - 36.0 g/dL   RDW 22.0 (H) 11.5 - 15.5 %   Platelets 154 150 - 400 K/uL     HEENT: normal and poor dentition Cardio:tachycardia no murmur Resp:CTA B GI: BS positive Extremity:  Edema R BKA stump improved Skin:   Other R BKA incision clean and intact with minimal serosanginous drainage on pad no active Neuro: Alert/Oriented and Abnormal Motor 4+/5 in BUE and L HF, R HF 3,  3- B KE Musc/Skel:  Both limbs are sens to touch/ROM Gen NAD   Assessment/Plan: 1. Functional deficits secondary to New right and recent left BKA which require 3+ hours per day of interdisciplinary therapy in a comprehensive inpatient rehab setting. Physiatrist is providing close team supervision and 24 hour management of active medical problems listed below. Physiatrist and rehab team continue to assess barriers to discharge/monitor patient progress toward functional and medical goals. FIM: Function - Bathing Position: Sitting EOB Body parts bathed by patient: Right arm, Left arm, Chest, Abdomen, Front perineal area, Buttocks, Right upper leg, Left upper leg Body parts bathed by helper: Back Bathing not applicable: Left lower leg, Right lower leg Assist Level: Supervision or verbal cues  Function- Upper Body Dressing/Undressing What is the patient wearing?: Hospital gown (IV continuous) Function - Lower Body Dressing/Undressing What is the patient wearing?: Underwear, Pants Position: Sitting EOB Underwear - Performed by patient: Thread/unthread right underwear leg, Thread/unthread  left underwear leg, Pull underwear up/down Pants- Performed by patient: Thread/unthread right pants leg, Thread/unthread left pants leg, Pull pants up/down Assist for lower body dressing: Supervision or verbal cues  Function - Toileting Toileting activity did not occur: No continent bowel/bladder event Toileting steps completed by patient: Adjust clothing prior to toileting, Performs perineal hygiene, Adjust clothing after toileting Toileting steps completed by helper: Adjust clothing after toileting Assist level: Touching or steadying assistance (Pt.75%)  Function - Air cabin crew transfer activity did not occur:  (not attempted) Toilet transfer assistive device: Elevated toilet seat/BSC over toilet Assist level to toilet: Maximal assist (Pt 25 - 49%/lift and lower) Assist level from toilet: Touching or steadying assistance (Pt > 75%)  Function - Chair/bed transfer Chair/bed transfer activity did not occur: N/A Chair/bed transfer method: Lateral scoot Chair/bed transfer assist level: Touching or steadying assistance (Pt > 75%) Chair/bed transfer assistive device: Armrests, Sliding board  Function - Locomotion: Wheelchair Type: Manual Max wheelchair distance: 138f  Assist  Level: Supervision or verbal cues Assist Level: Supervision or verbal cues Assist Level: Supervision or verbal cues Turns around,maneuvers to table,bed, and toilet,negotiates 3% grade,maneuvers on rugs and over doorsills: No Function - Locomotion: Ambulation Ambulation activity did not occur: Safety/medical concerns Walk 10 feet activity did not occur: Safety/medical concerns Walk 50 feet with 2 turns activity did not occur: Safety/medical concerns Walk 150 feet activity did not occur: Safety/medical concerns Walk 10 feet on uneven surfaces activity did not occur: Safety/medical concerns  Function - Comprehension Comprehension: Auditory Comprehension assist level: Understands basic 90% of the  time/cues < 10% of the time  Function - Expression Expression: Verbal Expression assist level: Expresses basic 90% of the time/requires cueing < 10% of the time.  Function - Social Interaction Social Interaction assist level: Interacts appropriately 90% of the time - Needs monitoring or encouragement for participation or interaction.  Function - Problem Solving Problem solving assist level: Solves basic 90% of the time/requires cueing < 10% of the time  Function - Memory Memory assist level: Recognizes or recalls 90% of the time/requires cueing < 10% of the time Patient normally able to recall (first 3 days only): Current season, That he or she is in a hospital    Medical Problem List and Plan:  1. 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  CIR PT, OT -pt has KI for RLE. Needs to use---doing better with right knee extension  -  2. DVT Prophylaxis/Anticoagulation: Chronic Coumadin therapy for history of mechanical aortic valve  3. Pain Management: Lyrica 75 mg daily, oxycodone and Robaxin as needed d/c nortriptyline may be contributing to tachycardia and did not help phantom pain, ,    encouraged massage/sensory feedback 4. Mood: Provide emotional support  5. Neuropsych: This patient is capable of making decisions on her own behalf.  6. Skin/Wound Care: Dry dressing/ACE to right BK . Drainage decreasing. Local care to left BKA---almost healed  7. Fluids/Electrolytes/Nutrition: Routine I&O , reduced intake 25-50% meals, fluid intake >1061m 8. ESRD. Continue dialysis as directed. HD to occur at the end of the day to allow for participation in rehab activities per Nephro 9. Leukocytosis. 23.9 on 6/1---down to 12.9 today, no clinical signs of infection 10. Acute on chronic anemia. Continue Aranesp. Follow-up CBC Hgb stable 11. Hypertension. Lopressor 25 mg twice a day. Monitor with increased mobility  Vitals:   07/15/16  0540 07/15/16 0837  BP: 123/61 (!) 110/58  Pulse: 74 72  Resp: (!) 24   Temp: 98.4 F (36.9 C)    12. CAD/AVR. Continue chronic Coumadin per pharm protocol, if tach persists despite d/c nortriptyline may need to increase lopressor if BP allows 14. Diabetes mellitus of peripheral neuropathy. Hemoglobin A1c 5.7.Bouts of hypoglycemia with sliding scale discontinued   Started amaryl--some improvement--hypoglycemia d/ced CBG (last 3)   Recent Labs  07/14/16 1844 07/14/16 1903 07/14/16 1948  GLUCAP 57* 48* 206*    15. Hypothyroidism. Synthroid  16. Constipation. Laxative assistance    LOS (Days) 5 A FACE TO FACE EVALUATION WAS PERFORMED  Brailyn Killion E 07/15/2016, 8:41 AM

## 2016-07-15 NOTE — Progress Notes (Signed)
ANTICOAGULATION CONSULT NOTE - Follow-Up  Pharmacy Consult for Heparin + Warfarin Indication: mechanical AVR   Allergies  Allergen Reactions  . Acetaminophen Other (See Comments)    Liver transplant recipient   . Codeine Itching  . Mirtazapine Other (See Comments)    hallucination    Patient Measurements: Height: 5\' 4"  (162.6 cm) Weight: 119 lb 4.3 oz (54.1 kg) IBW/kg (Calculated) : 54.7 Heparin Dosing Weight: 52.6 kg  Vital Signs: Temp: 98.4 F (36.9 C) (06/05 0540) Temp Source: Oral (06/05 0540) BP: 110/58 (06/05 0837) Pulse Rate: 72 (06/05 0837)  Labs:  Recent Labs  07/13/16 0654 07/14/16 0707 07/14/16 1530 07/15/16 0508  HGB 7.9* 7.8*  --  7.8*  HCT 22.7* 22.4*  --  22.8*  PLT 152 119*  --  154  LABPROT 33.0* 28.6*  --  22.1*  INR 3.14 2.63  --  1.90  HEPARINUNFRC 0.47  --   --   --   CREATININE 4.69*  --  6.35*  --     Estimated Creatinine Clearance: 7.9 mL/min (A) (by C-G formula based on SCr of 6.35 mg/dL (H)).   Assessment: 57 YOF on warfarin PTA for mechanical AVR (INR goal 2.5-3.5) held for procedures, restarted coumadin 5/30, INR 1.9 today. Hgb 7.8, stable, pltc 154K, improving  - Recent course of flagyl (5/22 > 5/30) - ESRD - PO intake improving  * PTA warf regimen: 1.25 mg daily except 2.5 mg on Sundays, Tuesdays, Thursdays  Goal of Therapy:  Heparin level 0.3-0.7 units/ml INR 2.5-3.5 Monitor platelets by anticoagulation protocol: Yes   Plan:  - Warfarin 3 mg x 1 today - Daily PT/INR - If INR continue trending down, will consider adding heparin back  Maryanna Shape, PharmD, BCPS  Clinical Pharmacist  Pager: [phone]   07/15/2016 1:09 PM

## 2016-07-15 NOTE — Progress Notes (Signed)
Physical Therapy Session Note  Patient Details  Name: [name] MRN: [id number] Date of Birth: 06/11/1955  Today's Date: 07/15/2016 PT Individual Time: [phone] 0964-3838 PT Individual Time Calculation (min): 65 min and 30 min  Short Term Goals: Week 1:  PT Short Term Goal 1 (Week 1): STG=LTG to to estimated length of stay   Skilled Therapeutic Interventions/Progress Updates: Tx1: Pt presented in bed with breakfast agreeable to therapy. Required modA supine to sit with use of features. Cues for lateral wt shifting to facilitate scooting to EOB. Nsg/MD present for assessment and meds. Encouraged pt to eat some protein prior to transfer to w/c. PTA threaded pants modA for time management. Encouraged desensitization techniques as pt moderately guarded RLE with all movements. Pt performed SB transfer to w/c with minA and mod verbal cues for head/hips relationship. Pt c/o increased soreness in upper trap, neck and periscapular area. Relief with STM to these area and provided edu on DOMS and fatigue due to increased use of these ms.  Transported pt to rehab gym and performed QS x 10 with manual overpressure to encourage extension. Pt able to achieve -15 degrees ext. Pt propelled 113f back to room with x 2 breaks due to fatigue. Pt agreeable to remain in w/c after session with call bell within reach and needs met.   Tx2:  Pt presented in w/c agreeable to therapy. Pt propelled to rehab gym approx 1727fwith supervision and requiring x 2 rest breaks due to fatigue. Pt performed SB transfer to L with modA and cues for hand placement and head/hips relationship. Demonstrated improved technique returning to w/c however continued to require modA. Pt required breaks between transfers due to increased R residual limb pain and fatigue. Returned pt to room and pt request to remain in w/c at end of session with call bell within reach and needs met.      Therapy Documentation Precautions:   Precautions Precautions: Fall Restrictions Weight Bearing Restrictions: Yes RLE Weight Bearing: Non weight bearing LLE Weight Bearing: Non weight bearing General:   Vital Signs: Therapy Vitals Pulse Rate: 72 BP: (!) 110/58     See Function Navigator for Current Functional Status.   Therapy/Group: Individual Therapy  Alantra Popoca  Laresha Bacorn, PTA  07/15/2016, 9:55 AM

## 2016-07-16 ENCOUNTER — Inpatient Hospital Stay (HOSPITAL_COMMUNITY): Payer: Medicare Other | Admitting: Occupational Therapy

## 2016-07-16 ENCOUNTER — Inpatient Hospital Stay (HOSPITAL_COMMUNITY): Payer: Medicare Other | Admitting: Physical Therapy

## 2016-07-16 LAB — CBC
HEMATOCRIT: 22.6 % — AB (ref 36.0–46.0)
HEMATOCRIT: 23 % — AB (ref 36.0–46.0)
Hemoglobin: 7.7 g/dL — ABNORMAL LOW (ref 12.0–15.0)
Hemoglobin: 7.6 g/dL — ABNORMAL LOW (ref 12.0–15.0)
MCH: 30.1 pg (ref 26.0–34.0)
MCH: 29.3 pg (ref 26.0–34.0)
MCHC: 34.1 g/dL (ref 30.0–36.0)
MCHC: 33 g/dL (ref 30.0–36.0)
MCV: 88.3 fL (ref 78.0–100.0)
MCV: 88.8 fL (ref 78.0–100.0)
PLATELETS: 178 10*3/uL (ref 150–400)
PLATELETS: 212 10*3/uL (ref 150–400)
RBC: 2.56 MIL/uL — AB (ref 3.87–5.11)
RBC: 2.59 MIL/uL — AB (ref 3.87–5.11)
RDW: 22.8 % — AB (ref 11.5–15.5)
RDW: 22 % — ABNORMAL HIGH (ref 11.5–15.5)
WBC: 11.8 10*3/uL — ABNORMAL HIGH (ref 4.0–10.5)
WBC: 11.9 10*3/uL — ABNORMAL HIGH (ref 4.0–10.5)

## 2016-07-16 LAB — PROTIME-INR
INR: 2.07
Prothrombin Time: 23.7 seconds — ABNORMAL HIGH (ref 11.4–15.2)

## 2016-07-16 LAB — RENAL FUNCTION PANEL
Albumin: 1.7 g/dL — ABNORMAL LOW (ref 3.5–5.0)
Anion gap: 11 (ref 5–15)
BUN: 17 mg/dL (ref 6–20)
CHLORIDE: 93 mmol/L — AB (ref 101–111)
CO2: 24 mmol/L (ref 22–32)
CREATININE: 5.56 mg/dL — AB (ref 0.44–1.00)
Calcium: 9 mg/dL (ref 8.9–10.3)
GFR, EST AFRICAN AMERICAN: 9 mL/min — AB (ref >60)
GFR, EST NON AFRICAN AMERICAN: 7 mL/min — AB (ref >60)
Glucose, Bld: 288 mg/dL — ABNORMAL HIGH (ref 65–99)
POTASSIUM: 3.5 mmol/L (ref 3.5–5.1)
Phosphorus: 6.5 mg/dL — ABNORMAL HIGH (ref 2.5–4.6)
Sodium: 128 mmol/L — ABNORMAL LOW (ref 135–145)

## 2016-07-16 LAB — GLUCOSE, CAPILLARY
Glucose-Capillary: 113 mg/dL — ABNORMAL HIGH (ref 65–99)
Glucose-Capillary: 103 mg/dL — ABNORMAL HIGH (ref 65–99)
Glucose-Capillary: 87 mg/dL (ref 65–99)
Glucose-Capillary: 128 mg/dL — ABNORMAL HIGH (ref 65–99)

## 2016-07-16 MED ORDER — WARFARIN SODIUM 3 MG PO TABS: 3.0000 mg | ORAL_TABLET | Freq: Once | ORAL | Status: DC

## 2016-07-16 MED ORDER — CEFAZOLIN SODIUM-DEXTROSE 1-4 GM/50ML-% IV SOLN
1.0000 g | INTRAVENOUS | Status: AC
  Filled 2016-07-16: qty 50

## 2016-07-16 NOTE — Progress Notes (Signed)
Occupational Therapy Session Note  Patient Details  Name: [name] MRN: [id number] Date of Birth: 16-Oct-1955  Today's Date: 07/16/2016 OT Individual Time: 3419-6222 and 1330-1430 OT Individual Time Calculation (min): 45 min and 30 min   Short Term Goals: Week 1:  OT Short Term Goal 1 (Week 1): Pt will complete clothing managment prior to toileting with supervision OT Short Term Goal 2 (Week 1): Pt will complete lower body dressing with MIN A OT Short Term Goal 3 (Week 1): Pt will maintain dynamic sitting balance for 5 min with set up OT Short Term Goal 4 (Week 1): Pt will consistantly complete slide board transfers with MOD A (or less)  Skilled Therapeutic Interventions/Progress Updates:    Session one: Pt seen for OT session focusing on ADL re-training and w/c parts management. Pt asleep in supine upon arrival, easily awoken and agreeable to tx session. She transferred to EOB with min A using hospital bed functions. Seated EOB, pt ate yogurt while discussing d/c plans with therapist.  She dressed seated EOB, used lateral leans to pull pants up. Also discussed alternative way of rolling in bed to pull pants up. She demonstrated ability to pull pants up in the manner as well.  She completed sliding board transfer into w/c with min A. Required VCs and assist for proper set-up of sliding board.  Focused on w/c part management placing and removing leg rests, demonstration and hand over hand assist provided, required min A to place, able to remove with VCs for technique.  Pt left seated in w/c with RN present and set-up at sink with grooming tasks.   Session Two: Pt seen for OT session focusing on functional transfers and toileting task. Pt asleep in w/c upon arrival, easily awoken though had slept through lunch. Encouraged eating and importance of proper nourishment for healing and energy for tx sessions.  Pt agreeable to tx session focusing on toilet transfers. Pt with standard BSC at home,  unable to have insurance cover second [facility] therefore will need to complete A/P transfer w/c <> BSC. Completed A/P transfer to [facility] with supervision and assist to steady equipment with significantly increased time as rest breaks required throughout. She completed lateral leans on BSC for clothing management and then supervision posterior transfer back into w/c in very reasonable amount of time. Pt liking this transfer method for toileting and believes this will work at home.  Rest breaks required throughout transfers and clothing management due to fatigue, however, pt very motivated to return to independent level at d/c. She completed sliding board transfer w/c > EOB with assist to place board and close supervision. Pt left seated EOB at end of session awaiting transfer to Dialysis, RN made aware of pt's position.   Therapy Documentation Precautions:  Precautions Precautions: Fall Restrictions Weight Bearing Restrictions: Yes RLE Weight Bearing: Non weight bearing LLE Weight Bearing: Non weight bearing  See Function Navigator for Current Functional Status.   Therapy/Group: Individual Therapy  Lewis, Legacy Carrender C 07/16/2016, 7:07 AM

## 2016-07-16 NOTE — Progress Notes (Signed)
Pt for left arm dialysis access placement tomorrow. Npo after MN, consent.  Will need to hold coumadin for dialysis access tomorrow.  May get delayed if INR is too high.  Check labs in am.   [name], [facility] 07/16/2016 7:57 AM

## 2016-07-16 NOTE — Progress Notes (Signed)
Physical Therapy Session Note  Patient Details  Name: [name] MRN: [id number] Date of Birth: [birth year]  Today's Date: 07/16/2016 PT Individual Time: 1005-1030 AND 1100-1200 PT Individual Time Calculation (min): 25 min AND 60 min  Short Term Goals: Week 1:  PT Short Term Goal 1 (Week 1): STG=LTG to to estimated length of stay   Skilled Therapeutic Interventions/Progress Updates:   Session 1  Pt received sitting in [facility] and agreeable to PT  PT transported to Rehab gym with total assist for time management. A/P transfer with min assist anteriorly and supervision assist posteriorly. Significantly increased time due to pain and fatigue. Moderate cues for technique.   Session 2. Pt received sitting in WC and agreeable to PT  WC mobility through hall with supervision assist from PT. Min cues for doorway management and to maintain straight trajectory in hall.   Car transfer with SB. Min assist from PT for set up and stability on SB in transfer. Min cues for head/hips relationship.   WC parts management training prior to each transfer to manage amputee pads and wheel lock controlto allow improved positioning of WC for transfer.   SB transfer to and from mat table with Min assist for set up and proper SB placemnet.   Seated UE therex with 4 lb bar weight: shoulder flexion, Tricep extension, chest press, shoulder press. All completed x 8BUE.   Patient returned too room and left sitting in [facility] with call bell in reach and all needs met.        Therapy Documentation Precautions:  Precautions Precautions: Fall Restrictions Weight Bearing Restrictions: Yes RLE Weight Bearing: Non weight bearing LLE Weight Bearing: Non weight bearing   Pain: Pain Assessment Pain Assessment: 0-10 Pain Score: 3  Pain Type: Surgical pain Pain Location: Leg Pain Orientation: Right Pain Intervention(s): Medication (See eMAR)   See Function Navigator for Current Functional  Status.   Therapy/Group: Individual Therapy  [name] 07/16/2016, 12:07 PM

## 2016-07-16 NOTE — Progress Notes (Signed)
Subjective/Complaints:  No issues overnite, pt denies phantom pain and slept well  ROS: pt denies nausea, vomiting, diarrhea, cough, shortness of breath or chest pain   Objective: Vital Signs: Blood pressure (!) 102/55, pulse 63, temperature 99.1 F (37.3 C), temperature source Oral, resp. rate 16, height 5' 4"  (1.626 m), weight 54.3 kg (119 lb 11.2 oz), SpO2 94 %. No results found. Results for orders placed or performed during the hospital encounter of 07/10/16 (from the past 72 hour(s))  Glucose, capillary     Status: Abnormal   Collection Time: 07/13/16 11:57 AM  Result Value Ref Range   Glucose-Capillary 109 (H) 65 - 99 mg/dL   Comment 1 Notify RN   Glucose, capillary     Status: Abnormal   Collection Time: 07/13/16  5:06 PM  Result Value Ref Range   Glucose-Capillary 196 (H) 65 - 99 mg/dL   Comment 1 Notify RN   Glucose, capillary     Status: Abnormal   Collection Time: 07/13/16  9:10 PM  Result Value Ref Range   Glucose-Capillary 175 (H) 65 - 99 mg/dL  Protime-INR     Status: Abnormal   Collection Time: 07/14/16  7:07 AM  Result Value Ref Range   Prothrombin Time 28.6 (H) 11.4 - 15.2 seconds   INR 2.63   CBC     Status: Abnormal   Collection Time: 07/14/16  7:07 AM  Result Value Ref Range   WBC 11.1 (H) 4.0 - 10.5 K/uL    Comment: WHITE COUNT CONFIRMED ON SMEAR   RBC 2.63 (L) 3.87 - 5.11 MIL/uL   Hemoglobin 7.8 (L) 12.0 - 15.0 g/dL   HCT 22.4 (L) 36.0 - 46.0 %   MCV 85.2 78.0 - 100.0 fL   MCH 29.7 26.0 - 34.0 pg   MCHC 34.8 30.0 - 36.0 g/dL   RDW 21.9 (H) 11.5 - 15.5 %   Platelets 119 (L) 150 - 400 K/uL    Comment: SPECIMEN CHECKED FOR CLOTS PLATELET COUNT CONFIRMED BY SMEAR   Glucose, capillary     Status: None   Collection Time: 07/14/16  7:11 AM  Result Value Ref Range   Glucose-Capillary 90 65 - 99 mg/dL  Glucose, capillary     Status: None   Collection Time: 07/14/16 11:37 AM  Result Value Ref Range   Glucose-Capillary 84 65 - 99 mg/dL  Renal  function panel     Status: Abnormal   Collection Time: 07/14/16  3:30 PM  Result Value Ref Range   Sodium 127 (L) 135 - 145 mmol/L   Potassium 3.8 3.5 - 5.1 mmol/L   Chloride 93 (L) 101 - 111 mmol/L   CO2 23 22 - 32 mmol/L   Glucose, Bld 207 (H) 65 - 99 mg/dL   BUN 20 6 - 20 mg/dL   Creatinine, Ser 6.35 (H) 0.44 - 1.00 mg/dL   Calcium 8.9 8.9 - 10.3 mg/dL   Phosphorus 7.1 (H) 2.5 - 4.6 mg/dL   Albumin 1.7 (L) 3.5 - 5.0 g/dL   GFR calc non Af Amer 6 (L) >60 mL/min   GFR calc Af Amer 7 (L) >60 mL/min    Comment: (NOTE) The eGFR has been calculated using the CKD EPI equation. This calculation has not been validated in all clinical situations. eGFR's persistently <60 mL/min signify possible Chronic Kidney Disease.    Anion gap 11 5 - 15  Glucose, capillary     Status: Abnormal   Collection Time: 07/14/16  6:44 PM  Result Value Ref Range   Glucose-Capillary 57 (L) 65 - 99 mg/dL  Glucose, capillary     Status: Abnormal   Collection Time: 07/14/16  7:03 PM  Result Value Ref Range   Glucose-Capillary 48 (L) 65 - 99 mg/dL  Glucose, capillary     Status: Abnormal   Collection Time: 07/14/16  7:48 PM  Result Value Ref Range   Glucose-Capillary 206 (H) 65 - 99 mg/dL  Protime-INR     Status: Abnormal   Collection Time: 07/15/16  5:08 AM  Result Value Ref Range   Prothrombin Time 22.1 (H) 11.4 - 15.2 seconds   INR 1.90   CBC     Status: Abnormal   Collection Time: 07/15/16  5:08 AM  Result Value Ref Range   WBC 11.7 (H) 4.0 - 10.5 K/uL   RBC 2.65 (L) 3.87 - 5.11 MIL/uL   Hemoglobin 7.8 (L) 12.0 - 15.0 g/dL   HCT 22.8 (L) 36.0 - 46.0 %   MCV 86.0 78.0 - 100.0 fL   MCH 29.4 26.0 - 34.0 pg   MCHC 34.2 30.0 - 36.0 g/dL   RDW 22.0 (H) 11.5 - 15.5 %   Platelets 154 150 - 400 K/uL  Glucose, capillary     Status: Abnormal   Collection Time: 07/15/16 11:42 AM  Result Value Ref Range   Glucose-Capillary 167 (H) 65 - 99 mg/dL  Glucose, capillary     Status: Abnormal   Collection Time:  07/15/16  4:40 PM  Result Value Ref Range   Glucose-Capillary 193 (H) 65 - 99 mg/dL  Glucose, capillary     Status: Abnormal   Collection Time: 07/15/16  8:53 PM  Result Value Ref Range   Glucose-Capillary 193 (H) 65 - 99 mg/dL  Protime-INR     Status: Abnormal   Collection Time: 07/16/16  5:31 AM  Result Value Ref Range   Prothrombin Time 23.7 (H) 11.4 - 15.2 seconds   INR 2.07   CBC     Status: Abnormal   Collection Time: 07/16/16  5:31 AM  Result Value Ref Range   WBC 11.8 (H) 4.0 - 10.5 K/uL    Comment: WHITE COUNT CONFIRMED ON SMEAR   RBC 2.56 (L) 3.87 - 5.11 MIL/uL   Hemoglobin 7.7 (L) 12.0 - 15.0 g/dL    Comment: CONSISTENT WITH PREVIOUS RESULT   HCT 22.6 (L) 36.0 - 46.0 %   MCV 88.3 78.0 - 100.0 fL   MCH 30.1 26.0 - 34.0 pg   MCHC 34.1 30.0 - 36.0 g/dL   RDW 22.8 (H) 11.5 - 15.5 %   Platelets 178 150 - 400 K/uL    Comment: REPEATED TO VERIFY PLATELET COUNT CONFIRMED BY SMEAR   Glucose, capillary     Status: Abnormal   Collection Time: 07/16/16  6:45 AM  Result Value Ref Range   Glucose-Capillary 113 (H) 65 - 99 mg/dL     HEENT: normal and poor dentition Cardio:tachycardia no murmur Resp:CTA B GI: BS positive Extremity:  Edema R BKA stump improved Skin:   Other R BKA incision clean and intact with minimal serous drainage on pad no active Neuro: Alert/Oriented and Abnormal Motor 4+/5 in BUE and L HF, R HF 3,  3- B KE Musc/Skel: no hypersensitivity to touch on R distal stump Gen NAD   Assessment/Plan: 1. Functional deficits secondary to New right and recent left BKA which require 3+ hours per day of interdisciplinary therapy in a comprehensive inpatient rehab setting. Physiatrist is providing  close team supervision and 24 hour management of active medical problems listed below. Physiatrist and rehab team continue to assess barriers to discharge/monitor patient progress toward functional and medical goals. FIM: Function - Bathing Position: Sitting EOB Body  parts bathed by patient: Right arm, Left arm, Chest, Abdomen, Front perineal area, Buttocks, Right upper leg, Left upper leg Body parts bathed by helper: Back Bathing not applicable: Left lower leg, Right lower leg Assist Level: Supervision or verbal cues  Function- Upper Body Dressing/Undressing What is the patient wearing?: Hospital gown (IV continuous, patient prefers gown for now) Function - Lower Body Dressing/Undressing What is the patient wearing?: Underwear, Pants Position: Sitting EOB Underwear - Performed by patient: Thread/unthread right underwear leg, Thread/unthread left underwear leg, Pull underwear up/down Pants- Performed by patient: Thread/unthread right pants leg, Thread/unthread left pants leg, Pull pants up/down Assist for lower body dressing: Supervision or verbal cues  Function - Toileting Toileting activity did not occur: No continent bowel/bladder event Toileting steps completed by patient: Performs perineal hygiene Toileting steps completed by helper: Adjust clothing prior to toileting, Adjust clothing after toileting Toileting Assistive Devices: Grab bar or rail Assist level: Touching or steadying assistance (Pt.75%)  Function - Air cabin crew transfer activity did not occur:  (not attempted) Toilet transfer assistive device: Elevated toilet seat/BSC over toilet, Grab bar Assist level to toilet: Maximal assist (Pt 25 - 49%/lift and lower) Assist level from toilet: Moderate assist (Pt 50 - 74%/lift or lower)  Function - Chair/bed transfer Chair/bed transfer activity did not occur: N/A Chair/bed transfer method: Lateral scoot Chair/bed transfer assist level: Touching or steadying assistance (Pt > 75%) Chair/bed transfer assistive device: Armrests, Sliding board  Function - Locomotion: Wheelchair Type: Manual Max wheelchair distance: 124f  Assist Level: Supervision or verbal cues Assist Level: Supervision or verbal cues Assist Level: Supervision  or verbal cues Turns around,maneuvers to table,bed, and toilet,negotiates 3% grade,maneuvers on rugs and over doorsills: No Function - Locomotion: Ambulation Ambulation activity did not occur: Safety/medical concerns Walk 10 feet activity did not occur: Safety/medical concerns Walk 50 feet with 2 turns activity did not occur: Safety/medical concerns Walk 150 feet activity did not occur: Safety/medical concerns Walk 10 feet on uneven surfaces activity did not occur: Safety/medical concerns  Function - Comprehension Comprehension: Auditory Comprehension assist level: Understands basic 90% of the time/cues < 10% of the time  Function - Expression Expression: Verbal Expression assist level: Expresses basic 90% of the time/requires cueing < 10% of the time.  Function - Social Interaction Social Interaction assist level: Interacts appropriately 90% of the time - Needs monitoring or encouragement for participation or interaction.  Function - Problem Solving Problem solving assist level: Solves basic 90% of the time/requires cueing < 10% of the time  Function - Memory Memory assist level: Recognizes or recalls 90% of the time/requires cueing < 10% of the time Patient normally able to recall (first 3 days only): Current season, That he or she is in a hospital    Medical Problem List and Plan:  1. 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  CIR PT, OT, Team conference today please see physician documentation under team conference tab, met with team face-to-face to discuss problems,progress, and goals. Formulized individual treatment plan based on medical history, underlying problem and comorbidities. -pt has KI for RLE. Needs to use---doing better with right knee extension  -  2. DVT Prophylaxis/Anticoagulation: Chronic Coumadin therapy for history of mechanical aortic  valve  3. Pain Management: Lyrica 75 mg daily, oxycodone  and Robaxin as needed d/c nortriptyline may be contributing to tachycardia and did not help phantom pain, ,    encouraged massage/sensory feedback 4. Mood: Provide emotional support  5. Neuropsych: This patient is capable of making decisions on her own behalf.  6. Skin/Wound Care: Dry dressing/ACE to right BK . Drainage decreasing. Local care to left BKA---almost healed  7. Fluids/Electrolytes/Nutrition: Routine I&O , reduced intake 25-50% meals, fluid intake >1058m, Doesn't like renal diet- I told pt to discuss this with Nephro 8. ESRD. Continue dialysis as directed. HD to occur at the end of the day to allow for participation in rehab activities per Nephro 9. Leukocytosis. 23.9 on 6/1---down to 12.9 today, no clinical signs of infection 10. Acute on chronic anemia. Continue Aranesp. Follow-up CBC Hgb stable 11. Hypertension. Lopressor 25 mg twice a day. Monitor with increased mobility  Vitals:   07/15/16 2031 07/16/16 0547  BP: 117/61 (!) 102/55  Pulse: 74 63  Resp:  16  Temp:  99.1 F (37.3 C)   12. CAD/AVR. Continue chronic Coumadin per pharm protocol,tachy resolved after d/c nortriptyline                   14. Diabetes mellitus of peripheral neuropathy. Hemoglobin A1c 5.7.Bouts of hypoglycemia with sliding scale discontinued  contol is fair, became hypoglycemic on glimipride CBG (last 3)   Recent Labs  07/15/16 1640 07/15/16 2053 07/16/16 0645  GLUCAP 193* 193* 113*    15. Hypothyroidism. Synthroid  16. Constipation. Laxative assistance    LOS (Days) 6 A FACE TO FACE EVALUATION WAS PERFORMED  KIRSTEINS,ANDREW E 07/16/2016, 9:35 AM

## 2016-07-16 NOTE — Progress Notes (Signed)
Subjective:   No new events LUE AVG tentative for tomorrow For HD today  Objective Vital signs in last 24 hours: Vitals:   07/15/16 0837 07/15/16 1411 07/15/16 2031 07/16/16 0547  BP: (!) 110/58 (!) 102/57 117/61 (!) 102/55  Pulse: 72 79 74 63  Resp:  18  16  Temp:  97.7 F (36.5 C)  99.1 F (37.3 C)  TempSrc:  Oral  Oral  SpO2:  94%  94%  Weight:    54.3 kg (119 lb 11.2 oz)  Height:       Weight change: -0.604 kg (-1 lb 5.3 oz)  Physical Exam: General: alert in wheelchair in Room Heart: RRR  1/6 sem lsb , no rub/ crisp valve sounds   Lungs: CTA Abdomen: soft nt, nd Extremities:Bilat BKA , New on R clean dry bandage   Dialysis Acces-R IJ Perm cath   Dialysis Orders:  [facility] MWF - right IJ 3.5h 180F  BFR 450/A1.5x  2K/2Ca  Na Linear  EDW 50 kg No Heparin -Calcitriol 0.5 mcg PO q HD -Mircera 50 mcg IV q 4 weeks   Background: 61 y.o.femalewith ESRD on HD, HTN, DM, CAD, chronic back pain, 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 Scot Dock. 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 Arteriogram no surgically correctable dx.     Problem/Plan: 1. PAD/R foot gangrene/recent L BKA (not healed) - s/p R BKA 5/29  in CIR 2. ESRD - MWF via [place] for AVG LUE 6/7; HD today 3K, 2L 3. Anemia - max ESA, monitor 4. Metabolic bone disease - Cont VDRA/Ca acetate binder  5. Severe protein malnutrition - Renal diet/vitamins/ supplements - on regular diet to promote intake- intake poor 6. S/p mechanical AVR and TDC -- appreciate quick help of VVS- anticoagulation per pharmacy  7. H/o liver transplant - on Prograf 8. DM - per primary  9.   BP/volume - BP ok / still above EDW by bed weight cont to try to lower post weights  R Bari Leib MD 07/16/2016,12:30 PM  LOS: 6 days   Labs: Basic Metabolic Panel:  Recent Labs Lab 07/11/16 1450 07/13/16 0654 07/14/16 1530  NA 128*  130* 127*  K 4.2 3.6 3.8  CL 95* 95* 93*  CO2 23 23 23   GLUCOSE 263* 125* 207*  BUN 12 10 20   CREATININE 4.86* 4.69* 6.35*  CALCIUM 9.1 8.9 8.9  PHOS 6.8* 6.1* 7.1*   Liver Function Tests:  Recent Labs Lab 07/11/16 1450 07/13/16 0654 07/14/16 1530  AST  --  79*  --   ALT  --  21  --   ALKPHOS  --  352*  --   BILITOT  --  4.0*  --   PROT  --  6.4*  --   ALBUMIN 1.6* 1.6* 1.7*   No results for input(s): LIPASE, AMYLASE in the last 168 hours. No results for input(s): AMMONIA in the last 168 hours. CBC:  Recent Labs Lab 07/11/16 1449  07/13/16 0654 07/14/16 0707 07/15/16 0508 07/16/16 0531  WBC 23.9*  --  12.9* 11.1* 11.7* 11.8*  HGB 7.1*  < > 7.9* 7.8* 7.8* 7.7*  HCT 20.2*  < > 22.7* 22.4* 22.8* 22.6*  MCV 83.8  --  83.2 85.2 86.0 88.3  PLT 162  --  152 119* 154 178  < > = values in this interval not displayed. Cardiac Enzymes: No results for input(s): CKTOTAL, CKMB,  CKMBINDEX, TROPONINI in the last 168 hours. CBG:  Recent Labs Lab 07/15/16 1142 07/15/16 1640 07/15/16 2053 07/16/16 0645 07/16/16 1209  GLUCAP 167* 193* 193* 113* 103*    Studies/Results: No results found. Medications: . [START ON 07/17/2016]  ceFAZolin (ANCEF) IV    . ferric gluconate (FERRLECIT/NULECIT) IV     . aspirin EC  81 mg Oral Daily  . calcitRIOL  0.5 mcg Oral Q M,W,F-HD  . calcium acetate  667 mg Oral TID WC  . darbepoetin (ARANESP) injection - DIALYSIS  200 mcg Intravenous Q Mon-HD  . docusate sodium  100 mg Oral Daily  . feeding supplement  1 Container Oral TID BM  . gabapentin  300 mg Oral QHS  . levothyroxine  175 mcg Oral QAC breakfast  . metoCLOPramide  5 mg Oral BID AC  . metoprolol tartrate  12.5 mg Oral BID  . multivitamin  1 tablet Oral QHS  . polyethylene glycol  17 g Oral BID  . senna-docusate  1 tablet Oral BID  . tacrolimus  2 mg Oral BID  . Warfarin - Pharmacist Dosing Inpatient   Does not apply [phone]

## 2016-07-16 NOTE — Plan of Care (Signed)
Problem: RH Toilet Transfers Goal: LTG Patient will perform toilet transfers w/assist (OT) LTG: Patient will perform toilet transfers with assist, with/without cues using equipment (OT)  Upgraded due to pt progress. Napoleon Form, OTR/L

## 2016-07-16 NOTE — Progress Notes (Signed)
ANTICOAGULATION CONSULT NOTE - Follow Up Consult  Pharmacy Consult for coumadin Indication: mechanical AVR  Allergies  Allergen Reactions  . Acetaminophen Other (See Comments)    Liver transplant recipient   . Codeine Itching  . Mirtazapine Other (See Comments)    hallucination    Patient Measurements: Height: 5\' 4"  (162.6 cm) Weight: 119 lb 11.2 oz (54.3 kg) IBW/kg (Calculated) : 54.7 Heparin Dosing Weight:   Vital Signs: Temp: 99.1 F (37.3 C) (06/06 0547) Temp Source: Oral (06/06 0547) BP: 102/55 (06/06 0547) Pulse Rate: 63 (06/06 0547)  Labs:  Recent Labs  07/14/16 0707 07/14/16 1530 07/15/16 0508 07/16/16 0531  HGB 7.8*  --  7.8* 7.7*  HCT 22.4*  --  22.8* 22.6*  PLT 119*  --  154 178  LABPROT 28.6*  --  22.1* 23.7*  INR 2.63  --  1.90 2.07  CREATININE  --  6.35*  --   --     Estimated Creatinine Clearance: 8 mL/min (A) (by C-G formula based on SCr of 6.35 mg/dL (H)).   Medications:  Scheduled:  . aspirin EC  81 mg Oral Daily  . calcitRIOL  0.5 mcg Oral Q M,W,F-HD  . calcium acetate  667 mg Oral TID WC  . darbepoetin (ARANESP) injection - DIALYSIS  200 mcg Intravenous Q Mon-HD  . docusate sodium  100 mg Oral Daily  . feeding supplement  1 Container Oral TID BM  . gabapentin  300 mg Oral QHS  . levothyroxine  175 mcg Oral QAC breakfast  . metoCLOPramide  5 mg Oral BID AC  . metoprolol tartrate  12.5 mg Oral BID  . multivitamin  1 tablet Oral QHS  . polyethylene glycol  17 g Oral BID  . senna-docusate  1 tablet Oral BID  . tacrolimus  2 mg Oral BID  . Warfarin - Pharmacist Dosing Inpatient   Does not apply q1800   Infusions:  . [START ON 07/17/2016]  ceFAZolin (ANCEF) IV    . ferric gluconate (FERRLECIT/NULECIT) IV      Assessment: 61 yo female with mechanical AVR is currently on subtherapeutic coumadin but INR is rising with it up to 2.07.  * PTA warf regimen: 1.25 mg daily except 2.5 mg on Sundays, Tuesdays, Thursdays  Goal of Therapy:  INR  2.5-3.5 Monitor platelets by anticoagulation protocol: Yes   Plan:  - holding coumadin tonight per MD's request due to obtaining dialysis access tomorrow.  Will not start heparin for now 'cause expecting INR to go up even more tomorrow due to higher dose received the past few days compared to home dose - f/u plan to restart anticoagulation after obtaining HD access tomorrow.  Kylia Grajales, Tsz-Yin 07/16/2016,8:43 AM

## 2016-07-16 NOTE — Patient Care Conference (Signed)
Inpatient RehabilitationTeam Conference and Plan of Care Update Date: 07/16/2016   Time: 10:45 AM    Patient Name: [name]      Medical Record Number: [id number]  Date of Birth: [birth year] Sex: Female         Room/Bed: 4W23C/4W23C-01 Payor Info: Payor: MEDICARE / Plan: MEDICARE PART A AND B / Product Type: *No Product type* /    Admitting Diagnosis: BKA ESRD  Admit Date/Time:  07/10/2016  4:27 PM Admission Comments: No comment available   Primary Diagnosis:  Amputation of right lower extremity below knee [facility]) Principal Problem: Amputation of right lower extremity below knee [facility])  Patient Active Problem List   Diagnosis Date Noted  . Amputation of right lower extremity below knee ([place]) 07/10/2016  . History of liver transplant ([place])   . Post-operative pain   . Unilateral complete BKA, right, subsequent encounter ([place])   . S/P bilateral BKA (below knee amputation) ([place])   . Malnutrition of moderate degree 07/01/2016  . Cellulitis 06/30/2016  . PAD (peripheral artery disease) ([place]) 06/30/2016  . Diabetic foot infection ([place]) 06/30/2016  . Pressure injury of skin 05/28/2016  . Volume overload 05/28/2016  . Acute exacerbation of CHF (congestive heart failure) ([place]) 05/27/2016  . Extremity atherosclerosis with resting pain ([place])   . H/O heart valve replacement with mechanical valve   . Phantom limb pain ([place])   . Subtherapeutic international normalized ratio (INR)   . Supratherapeutic INR   . Anemia of chronic disease   . Chronic midline low back pain without sciatica   . ESRD on dialysis ([place])   . Type 2 diabetes mellitus with complication, with long-term current use of insulin ([place])   . S/P unilateral BKA (below knee amputation), left ([place]) 04/30/2016  . Unilateral complete BKA, left, initial encounter ([place]) 04/30/2016  . Abnormality of gait   . Coronary artery disease involving coronary bypass graft of native heart without angina pectoris   . Benign essential HTN   .  History of migraine   . Leukocytosis   . Critical lower limb ischemia 04/22/2016  . Foot pain 04/21/2016  . Fever   . Advance care planning   . Goals of care, counseling/discussion   . Palliative care by specialist   . Sepsis ([place])   . Hypotension   . Claudication of left lower extremity ([place])   . Elevated troponin 03/05/2016  . Pressure injury of skin, stage 2 11/27/2015  . H/O unilateral nephrectomy 07/06/2015  . Paroxysmal SVT (supraventricular tachycardia) ([place]) 06/22/2014  . Complications due to renal dialysis device, implant, and graft 05/11/2014  . Acute blood loss anemia 03/31/2014  . Renal dialysis device, implant, or graft complication 86/76/7209  . Acute hyperkalemia   . S/P liver transplant ([place])   . Chronic hepatitis C without hepatic coma ([place])   . Pulmonary edema 01/03/2014  . Respiratory failure ([place]) 01/03/2014  . Long term current use of anticoagulant therapy 04/22/2013  . Chronic anticoagulation w/ coumadin, goal INR 2.5-3.0 w/ AVR 04/14/2013  . Tobacco abuse 02/28/2013  . COPD (chronic obstructive pulmonary disease) ([place]) 02/21/2013  . Chronic combined systolic and diastolic congestive heart failure ([place]) 02/06/2013  . Acute respiratory failure ([place]) 11/02/2012  . Acute pulmonary edema ([place]) 11/02/2012  . Acute on chronic systolic CHF (congestive heart failure) ([place]) 11/02/2012  . Dyslipidemia-LDL 104, not on statin with Hx of liver transplant 07/30/2012  . CAD (coronary artery disease), native coronary artery 07/27/2012  . History of prosthetic aortic valve  replacement   . End stage renal disease on dialysis ([place]) 02/27/2011  . Hypothyroidism 06/25/2006  . Type 2 diabetes mellitus with chronic kidney disease ([place]) 06/25/2006  . GERD 06/25/2006  . Anemia due to chronic renal failure    . Hypertensive heart disease     Expected Discharge Date: Expected Discharge Date: 07/23/16  Team Members Present: Physician leading conference: Dr. Alysia Penna Social Worker Present: Ovidio Kin, LCSW Nurse Present: Other (comment) Leslee Home Evans-RN) PT Present: Barrie Folk, PT OT Present: Napoleon Form, OT SLP Present: Gunnar Fusi, SLP PPS Coordinator present : Daiva Nakayama, RN, CRRN     Current Status/Progress Goal Weekly Team Focus  Medical   Phantom pain improved, vascular access issues for HD  manage pain meds,   vascular access, AV graft placement   Bowel/Bladder   HD Patient / Oliguric/ Continent of bowel  with loose stool at this time. (refusing colace and mirlax)  remain continent of bowel and bladder when able  assess bowel and bladder q shift   Swallow/Nutrition/ Hydration             ADL's   Supervision- min A transfers and bathing/dressing, Can be mod A functional transfers depending on fatigue. Completing sliding board and A/P transfers  Supervision overall  activity tolerance, functional transfers, UE strengthening/ endurance, family training, d/c planning.    Mobility   supervision w/c mobilty, min to modA SB transfer pending fatigue, decreased endurance  min assist with transfers  sitting balance, endurance, SB transfers   Communication             Safety/Cognition/ Behavioral Observations  no unsafe behavior/calls appropriately  min assist  assess safety q shift   Pain   Rates pain to right stump 6-8/Oxy 5mg   q 4hrs and Robaxin 500mg   q 6 hrs  pain less than or equal to 2  assess pain q shift   Skin   Dry skin/ staples to right stump-staples,4x4's, kerlix, acewrap/hydrogel to left BKA  no new skin breakdown this admission  assess skin q shift      *See Care Plan and progress notes for long and short-term goals.  Barriers to Discharge: LUE may be limited after AVG placement    Possible Resolutions to Barriers:  see above    Discharge Planning/Teaching Needs:  HOme with daughter's assisting with her care as prior to admission. pt was here last month knows the process.      Team Discussion:  Goals of  min assist wheelchair level. Decreased endurance and pain issues. AV graft tomorrow if INR ok according to MD. Pain improved with adjustment of pain meds. Sleeping better. Will need family education with daughter's soon.  Revisions to Treatment Plan:  DC 6/13   Continued Need for Acute Rehabilitation Level of Care: The patient requires daily medical management by a physician with specialized training in physical medicine and rehabilitation for the following conditions: Daily direction of a multidisciplinary physical rehabilitation program to ensure safe treatment while eliciting the highest outcome that is of practical value to the patient.: Yes Daily medical management of patient stability for increased activity during participation in an intensive rehabilitation regime.: Yes Daily analysis of laboratory values and/or radiology reports with any subsequent need for medication adjustment of medical intervention for : Neurological problems;Other;Wound care problems  Elease Hashimoto 07/17/2016, 9:54 AM

## 2016-07-17 ENCOUNTER — Inpatient Hospital Stay (HOSPITAL_COMMUNITY): Payer: Medicare Other | Admitting: Occupational Therapy

## 2016-07-17 ENCOUNTER — Encounter (HOSPITAL_COMMUNITY): Admission: RE | Payer: Self-pay | Source: Ambulatory Visit

## 2016-07-17 ENCOUNTER — Inpatient Hospital Stay (HOSPITAL_COMMUNITY): Payer: Medicare Other

## 2016-07-17 ENCOUNTER — Ambulatory Visit (HOSPITAL_COMMUNITY): Admission: RE | Admit: 2016-07-17 | Payer: Medicare Other | Source: Ambulatory Visit | Admitting: Surgery

## 2016-07-17 LAB — CBC
HEMATOCRIT: 22.8 % — AB (ref 36.0–46.0)
Hemoglobin: 7.4 g/dL — ABNORMAL LOW (ref 12.0–15.0)
MCH: 29 pg (ref 26.0–34.0)
MCHC: 32.5 g/dL (ref 30.0–36.0)
MCV: 89.4 fL (ref 78.0–100.0)
PLATELETS: 230 10*3/uL (ref 150–400)
RBC: 2.55 MIL/uL — ABNORMAL LOW (ref 3.87–5.11)
RDW: 22.4 % — AB (ref 11.5–15.5)
WBC: 11.9 10*3/uL — ABNORMAL HIGH (ref 4.0–10.5)

## 2016-07-17 LAB — BASIC METABOLIC PANEL
Anion gap: 10 (ref 5–15)
BUN: 7 mg/dL (ref 6–20)
CALCIUM: 8.6 mg/dL — AB (ref 8.9–10.3)
CO2: 26 mmol/L (ref 22–32)
CREATININE: 3.1 mg/dL — AB (ref 0.44–1.00)
Chloride: 96 mmol/L — ABNORMAL LOW (ref 101–111)
GFR, EST AFRICAN AMERICAN: 18 mL/min — AB (ref >60)
GFR, EST NON AFRICAN AMERICAN: 15 mL/min — AB (ref >60)
GLUCOSE: 134 mg/dL — AB (ref 65–99)
Potassium: 3.5 mmol/L (ref 3.5–5.1)
Sodium: 132 mmol/L — ABNORMAL LOW (ref 135–145)

## 2016-07-17 LAB — GLUCOSE, CAPILLARY
GLUCOSE-CAPILLARY: 109 mg/dL — AB (ref 65–99)
GLUCOSE-CAPILLARY: 164 mg/dL — AB (ref 65–99)
GLUCOSE-CAPILLARY: 100 mg/dL — AB (ref 65–99)

## 2016-07-17 LAB — PROTIME-INR
INR: 2
PROTHROMBIN TIME: 23 s — AB (ref 11.4–15.2)

## 2016-07-17 LAB — HEPARIN LEVEL (UNFRACTIONATED): Heparin Unfractionated: <0.1 IU/mL — ABNORMAL LOW (ref 0.30–0.70)

## 2016-07-17 SURGERY — INSERTION OF ARTERIOVENOUS (AV) GORE-TEX GRAFT ARM
Anesthesia: Monitor Anesthesia Care | Site: Arm Upper | Laterality: Left

## 2016-07-17 MED ORDER — HEPARIN (PORCINE) IN NACL 100-0.45 UNIT/ML-% IJ SOLN
1400.0000 [IU]/h | INTRAMUSCULAR | Status: DC
  Administered 2016-07-17: 1050 [IU]/h via INTRAVENOUS
  Administered 2016-07-18 – 2016-07-20 (×3): 1400 [IU]/h via INTRAVENOUS
  Filled 2016-07-17 (×4): qty 250

## 2016-07-17 NOTE — Progress Notes (Signed)
Subjective:   No new events HD yesterday:   Objective Vital signs in last 24 hours: Vitals:   07/16/16 1917 07/16/16 2023 07/16/16 2024 07/17/16 0441  BP: (!) 105/59 (!) 126/56  (!) 95/53  Pulse:  88  88  Resp: 20  20 18   Temp: 98 F (36.7 C)  98.8 F (37.1 C) 99.8 F (37.7 C)  TempSrc: Oral  Oral Oral  SpO2: 96%   99%  Weight: 54.4 kg (119 lb 14.9 oz)   57 kg (125 lb 10.6 oz)  Height:       Weight change: 0.805 kg (1 lb 12.4 oz)  Physical Exam: General: alert in wheelchair in Room Heart: RRR   Lungs: CTA Abdomen: soft nt, nd Extremities:Bilat BKA , New on R clean dry bandage   Dialysis Acces-R IJ Perm cath   Dialysis Orders:  [facility] MWF - right IJ 3.5h 180F  BFR 450/A1.5x  2K/2Ca  Na Linear  EDW 50 kg No Heparin -Calcitriol 0.5 mcg PO q HD -Mircera 50 mcg IV q 4 weeks   Background: 61 y.o.femalewith ESRD on HD, HTN, DM, CAD, chronic back pain, 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 Scot Dock. 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 Arteriogram no surgically correctable dx.     Problem/Plan: 1. PAD/R foot gangrene/recent L BKA (not healed) - s/p R BKA 5/29  in CIR 2. ESRD - MWF via [facility] for AVG LUE today 6/7; HD on MWF schedule; 3K no heparin 3. Anemia - max ESA, monitor 4. Metabolic bone disease - Cont VDRA/Ca acetate binder  5. Severe protein malnutrition - Renal diet/vitamins/ supplements - on regular diet to promote intake- intake poor 6. S/p mechanical AVR and TDC -- for AVG LUE today 7. H/o liver transplant - on Prograf 8. DM - per primary  9.   BP/volume - Cont to probe for new lower EDW  R Makynzi Eastland MD 07/17/2016,9:33 AM  LOS: 7 days   Labs: Basic Metabolic Panel:  Recent Labs Lab 07/13/16 0654 07/14/16 1530 07/16/16 1553 07/17/16 0603  NA 130* 127* 128* 132*  K 3.6 3.8 3.5 3.5  CL 95* 93* 93* 96*  CO2 23 23 24 26   GLUCOSE 125*  207* 288* 134*  BUN 10 20 17 7   CREATININE 4.69* 6.35* 5.56* 3.10*  CALCIUM 8.9 8.9 9.0 8.6*  PHOS 6.1* 7.1* 6.5*  --    Liver Function Tests:  Recent Labs Lab 07/13/16 0654 07/14/16 1530 07/16/16 1553  AST 79*  --   --   ALT 21  --   --   ALKPHOS 352*  --   --   BILITOT 4.0*  --   --   PROT 6.4*  --   --   ALBUMIN 1.6* 1.7* 1.7*   No results for input(s): LIPASE, AMYLASE in the last 168 hours. No results for input(s): AMMONIA in the last 168 hours. CBC:  Recent Labs Lab 07/14/16 0707 07/15/16 0508 07/16/16 0531 07/16/16 1553 07/17/16 0603  WBC 11.1* 11.7* 11.8* 11.9* 11.9*  HGB 7.8* 7.8* 7.7* 7.6* 7.4*  HCT 22.4* 22.8* 22.6* 23.0* 22.8*  MCV 85.2 86.0 88.3 88.8 89.4  PLT 119* 154 178 212 230   Cardiac Enzymes: No results for input(s): CKTOTAL, CKMB, CKMBINDEX, TROPONINI in the last 168 hours. CBG:  Recent Labs Lab 07/16/16 0645 07/16/16 1209 07/16/16 2001 07/16/16 2135 07/17/16 0718  GLUCAP 113* 103* 87 128*  109*    Studies/Results: No results found. Medications: .  ceFAZolin (ANCEF) IV    . ferric gluconate (FERRLECIT/NULECIT) IV     . aspirin EC  81 mg Oral Daily  . calcitRIOL  0.5 mcg Oral Q M,W,F-HD  . calcium acetate  667 mg Oral TID WC  . darbepoetin (ARANESP) injection - DIALYSIS  200 mcg Intravenous Q Mon-HD  . docusate sodium  100 mg Oral Daily  . feeding supplement  1 Container Oral TID BM  . gabapentin  300 mg Oral QHS  . levothyroxine  175 mcg Oral QAC breakfast  . metoCLOPramide  5 mg Oral BID AC  . metoprolol tartrate  12.5 mg Oral BID  . multivitamin  1 tablet Oral QHS  . polyethylene glycol  17 g Oral BID  . senna-docusate  1 tablet Oral BID  . tacrolimus  2 mg Oral BID  . Warfarin - Pharmacist Dosing Inpatient   Does not apply [phone]

## 2016-07-17 NOTE — Progress Notes (Addendum)
Subjective/Complaints:  Phantom pain recurrent last noc, some feeling of SOB, OT notes no resp distress during therapy just fatigued today, poor sleep last noc ROS: pt denies nausea, vomiting, diarrhea, cough, shortness of breath or chest pain   Objective: Vital Signs: Blood pressure (!) 95/53, pulse 88, temperature 99.8 F (37.7 C), temperature source Oral, resp. rate 18, height 5' 4"  (1.626 m), weight 57 kg (125 lb 10.6 oz), SpO2 99 %. No results found. Results for orders placed or performed during the hospital encounter of 07/10/16 (from the past 72 hour(s))  Glucose, capillary     Status: None   Collection Time: 07/14/16 11:37 AM  Result Value Ref Range   Glucose-Capillary 84 65 - 99 mg/dL  Renal function panel     Status: Abnormal   Collection Time: 07/14/16  3:30 PM  Result Value Ref Range   Sodium 127 (L) 135 - 145 mmol/L   Potassium 3.8 3.5 - 5.1 mmol/L   Chloride 93 (L) 101 - 111 mmol/L   CO2 23 22 - 32 mmol/L   Glucose, Bld 207 (H) 65 - 99 mg/dL   BUN 20 6 - 20 mg/dL   Creatinine, Ser 6.35 (H) 0.44 - 1.00 mg/dL   Calcium 8.9 8.9 - 10.3 mg/dL   Phosphorus 7.1 (H) 2.5 - 4.6 mg/dL   Albumin 1.7 (L) 3.5 - 5.0 g/dL   GFR calc non Af Amer 6 (L) >60 mL/min   GFR calc Af Amer 7 (L) >60 mL/min    Comment: (NOTE) The eGFR has been calculated using the CKD EPI equation. This calculation has not been validated in all clinical situations. eGFR's persistently <60 mL/min signify possible Chronic Kidney Disease.    Anion gap 11 5 - 15  Glucose, capillary     Status: Abnormal   Collection Time: 07/14/16  6:44 PM  Result Value Ref Range   Glucose-Capillary 57 (L) 65 - 99 mg/dL  Glucose, capillary     Status: Abnormal   Collection Time: 07/14/16  7:03 PM  Result Value Ref Range   Glucose-Capillary 48 (L) 65 - 99 mg/dL  Glucose, capillary     Status: Abnormal   Collection Time: 07/14/16  7:48 PM  Result Value Ref Range   Glucose-Capillary 206 (H) 65 - 99 mg/dL  Protime-INR      Status: Abnormal   Collection Time: 07/15/16  5:08 AM  Result Value Ref Range   Prothrombin Time 22.1 (H) 11.4 - 15.2 seconds   INR 1.90   CBC     Status: Abnormal   Collection Time: 07/15/16  5:08 AM  Result Value Ref Range   WBC 11.7 (H) 4.0 - 10.5 K/uL   RBC 2.65 (L) 3.87 - 5.11 MIL/uL   Hemoglobin 7.8 (L) 12.0 - 15.0 g/dL   HCT 22.8 (L) 36.0 - 46.0 %   MCV 86.0 78.0 - 100.0 fL   MCH 29.4 26.0 - 34.0 pg   MCHC 34.2 30.0 - 36.0 g/dL   RDW 22.0 (H) 11.5 - 15.5 %   Platelets 154 150 - 400 K/uL  Glucose, capillary     Status: Abnormal   Collection Time: 07/15/16 11:42 AM  Result Value Ref Range   Glucose-Capillary 167 (H) 65 - 99 mg/dL  Glucose, capillary     Status: Abnormal   Collection Time: 07/15/16  4:40 PM  Result Value Ref Range   Glucose-Capillary 193 (H) 65 - 99 mg/dL  Glucose, capillary     Status: Abnormal  Collection Time: 07/15/16  8:53 PM  Result Value Ref Range   Glucose-Capillary 193 (H) 65 - 99 mg/dL  Protime-INR     Status: Abnormal   Collection Time: 07/16/16  5:31 AM  Result Value Ref Range   Prothrombin Time 23.7 (H) 11.4 - 15.2 seconds   INR 2.07   CBC     Status: Abnormal   Collection Time: 07/16/16  5:31 AM  Result Value Ref Range   WBC 11.8 (H) 4.0 - 10.5 K/uL    Comment: WHITE COUNT CONFIRMED ON SMEAR   RBC 2.56 (L) 3.87 - 5.11 MIL/uL   Hemoglobin 7.7 (L) 12.0 - 15.0 g/dL    Comment: CONSISTENT WITH PREVIOUS RESULT   HCT 22.6 (L) 36.0 - 46.0 %   MCV 88.3 78.0 - 100.0 fL   MCH 30.1 26.0 - 34.0 pg   MCHC 34.1 30.0 - 36.0 g/dL   RDW 22.8 (H) 11.5 - 15.5 %   Platelets 178 150 - 400 K/uL    Comment: REPEATED TO VERIFY PLATELET COUNT CONFIRMED BY SMEAR   Glucose, capillary     Status: Abnormal   Collection Time: 07/16/16  6:45 AM  Result Value Ref Range   Glucose-Capillary 113 (H) 65 - 99 mg/dL  Glucose, capillary     Status: Abnormal   Collection Time: 07/16/16 12:09 PM  Result Value Ref Range   Glucose-Capillary 103 (H) 65 - 99 mg/dL   CBC     Status: Abnormal   Collection Time: 07/16/16  3:53 PM  Result Value Ref Range   WBC 11.9 (H) 4.0 - 10.5 K/uL    Comment: WHITE COUNT CONFIRMED ON SMEAR   RBC 2.59 (L) 3.87 - 5.11 MIL/uL   Hemoglobin 7.6 (L) 12.0 - 15.0 g/dL   HCT 23.0 (L) 36.0 - 46.0 %   MCV 88.8 78.0 - 100.0 fL   MCH 29.3 26.0 - 34.0 pg   MCHC 33.0 30.0 - 36.0 g/dL   RDW 22.0 (H) 11.5 - 15.5 %   Platelets 212 150 - 400 K/uL    Comment: PLATELET COUNT CONFIRMED BY SMEAR  Renal function panel     Status: Abnormal   Collection Time: 07/16/16  3:53 PM  Result Value Ref Range   Sodium 128 (L) 135 - 145 mmol/L   Potassium 3.5 3.5 - 5.1 mmol/L   Chloride 93 (L) 101 - 111 mmol/L   CO2 24 22 - 32 mmol/L   Glucose, Bld 288 (H) 65 - 99 mg/dL   BUN 17 6 - 20 mg/dL   Creatinine, Ser 5.56 (H) 0.44 - 1.00 mg/dL   Calcium 9.0 8.9 - 10.3 mg/dL   Phosphorus 6.5 (H) 2.5 - 4.6 mg/dL   Albumin 1.7 (L) 3.5 - 5.0 g/dL   GFR calc non Af Amer 7 (L) >60 mL/min   GFR calc Af Amer 9 (L) >60 mL/min    Comment: (NOTE) The eGFR has been calculated using the CKD EPI equation. This calculation has not been validated in all clinical situations. eGFR's persistently <60 mL/min signify possible Chronic Kidney Disease.    Anion gap 11 5 - 15  Glucose, capillary     Status: None   Collection Time: 07/16/16  8:01 PM  Result Value Ref Range   Glucose-Capillary 87 65 - 99 mg/dL  Glucose, capillary     Status: Abnormal   Collection Time: 07/16/16  9:35 PM  Result Value Ref Range   Glucose-Capillary 128 (H) 65 - 99 mg/dL  Protime-INR  Status: Abnormal   Collection Time: 07/17/16  6:03 AM  Result Value Ref Range   Prothrombin Time 23.0 (H) 11.4 - 15.2 seconds   INR 2.00   CBC     Status: Abnormal   Collection Time: 07/17/16  6:03 AM  Result Value Ref Range   WBC 11.9 (H) 4.0 - 10.5 K/uL    Comment: WHITE COUNT CONFIRMED ON SMEAR   RBC 2.55 (L) 3.87 - 5.11 MIL/uL   Hemoglobin 7.4 (L) 12.0 - 15.0 g/dL   HCT 22.8 (L) 36.0 -  46.0 %   MCV 89.4 78.0 - 100.0 fL   MCH 29.0 26.0 - 34.0 pg   MCHC 32.5 30.0 - 36.0 g/dL   RDW 22.4 (H) 11.5 - 15.5 %   Platelets 230 150 - 400 K/uL    Comment: PLATELET COUNT CONFIRMED BY SMEAR  Basic metabolic panel     Status: Abnormal   Collection Time: 07/17/16  6:03 AM  Result Value Ref Range   Sodium 132 (L) 135 - 145 mmol/L   Potassium 3.5 3.5 - 5.1 mmol/L   Chloride 96 (L) 101 - 111 mmol/L   CO2 26 22 - 32 mmol/L   Glucose, Bld 134 (H) 65 - 99 mg/dL   BUN 7 6 - 20 mg/dL   Creatinine, Ser 3.10 (H) 0.44 - 1.00 mg/dL    Comment: DELTA CHECK NOTED   Calcium 8.6 (L) 8.9 - 10.3 mg/dL   GFR calc non Af Amer 15 (L) >60 mL/min   GFR calc Af Amer 18 (L) >60 mL/min    Comment: (NOTE) The eGFR has been calculated using the CKD EPI equation. This calculation has not been validated in all clinical situations. eGFR's persistently <60 mL/min signify possible Chronic Kidney Disease.    Anion gap 10 5 - 15  Glucose, capillary     Status: Abnormal   Collection Time: 07/17/16  7:18 AM  Result Value Ref Range   Glucose-Capillary 109 (H) 65 - 99 mg/dL     HEENT: normal and poor dentition Cardio:tachycardia no murmur Resp:CTA B GI: BS positive Extremity:  Edema R BKA stump improved Skin:   Other R BKA incision clean and intact with minimal serous drainage on pad no active Neuro: Alert/Oriented and Abnormal Motor 4+/5 in BUE and L HF, R HF 3,  3- B KE Musc/Skel: no hypersensitivity to touch on R distal stump Gen NAD   Assessment/Plan: 1. Functional deficits secondary to New right and recent left BKA which require 3+ hours per day of interdisciplinary therapy in a comprehensive inpatient rehab setting. Physiatrist is providing close team supervision and 24 hour management of active medical problems listed below. Physiatrist and rehab team continue to assess barriers to discharge/monitor patient progress toward functional and medical goals. FIM: Function - Bathing Position: Sitting  EOB Body parts bathed by patient: Right arm, Left arm, Chest, Abdomen, Front perineal area, Buttocks, Right upper leg, Left upper leg Body parts bathed by helper: Back Bathing not applicable: Left lower leg, Right lower leg Assist Level: Supervision or verbal cues  Function- Upper Body Dressing/Undressing What is the patient wearing?: Pull over shirt/dress Pull over shirt/dress - Perfomed by patient: Thread/unthread right sleeve, Thread/unthread left sleeve, Put head through opening, Pull shirt over trunk Assist Level: Set up Set up : To obtain clothing/put away Function - Lower Body Dressing/Undressing What is the patient wearing?: Underwear, Pants Position: Bed Underwear - Performed by patient: Thread/unthread right underwear leg, Thread/unthread left underwear leg, Pull underwear  up/down Pants- Performed by patient: Thread/unthread right pants leg, Thread/unthread left pants leg, Pull pants up/down Assist for lower body dressing: Touching or steadying assistance (Pt > 75%)  Function - Toileting Toileting activity did not occur: No continent bowel/bladder event Toileting steps completed by patient: Adjust clothing prior to toileting, Performs perineal hygiene, Adjust clothing after toileting Toileting steps completed by helper: Adjust clothing prior to toileting, Adjust clothing after toileting Toileting Assistive Devices: Grab bar or rail Assist level: Supervision or verbal cues  Function - Air cabin crew transfer activity did not occur:  (not attempted) Toilet transfer assistive device: Bedside commode Assist level to toilet: Maximal assist (Pt 25 - 49%/lift and lower) Assist level from toilet: Moderate assist (Pt 50 - 74%/lift or lower) Assist level to bedside commode (at bedside): Moderate assist (Pt 50 - 74%/lift or lower) Assist level from bedside commode (at bedside): Supervision or verbal cues  Function - Chair/bed transfer Chair/bed transfer activity did not  occur: N/A Chair/bed transfer method: Lateral scoot Chair/bed transfer assist level: Touching or steadying assistance (Pt > 75%) Chair/bed transfer assistive device: Sliding board  Function - Locomotion: Wheelchair Type: Manual Max wheelchair distance: 165f  Assist Level: Supervision or verbal cues Assist Level: Supervision or verbal cues Assist Level: Supervision or verbal cues Turns around,maneuvers to table,bed, and toilet,negotiates 3% grade,maneuvers on rugs and over doorsills: No Function - Locomotion: Ambulation Ambulation activity did not occur: Safety/medical concerns Walk 10 feet activity did not occur: Safety/medical concerns Walk 50 feet with 2 turns activity did not occur: Safety/medical concerns Walk 150 feet activity did not occur: Safety/medical concerns Walk 10 feet on uneven surfaces activity did not occur: Safety/medical concerns  Function - Comprehension Comprehension: Auditory Comprehension assist level: Understands basic 90% of the time/cues < 10% of the time  Function - Expression Expression: Verbal Expression assist level: Expresses basic 90% of the time/requires cueing < 10% of the time.  Function - Social Interaction Social Interaction assist level: Interacts appropriately 90% of the time - Needs monitoring or encouragement for participation or interaction.  Function - Problem Solving Problem solving assist level: Solves basic 90% of the time/requires cueing < 10% of the time  Function - Memory Memory assist level: Recognizes or recalls 90% of the time/requires cueing < 10% of the time Patient normally able to recall (first 3 days only): Current season, That he or she is in a hospital    Medical Problem List and Plan:  1. 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  CIR PT, OT, -pt has KI for RLE. Needs to use---doing better with right knee extension  -  2. DVT  Prophylaxis/Anticoagulation: Chronic Coumadin therapy for history of mechanical aortic valve  3. Pain Management: Gabapentin for phantom pain, not as effective last noc Trial TENs in therapy   4. Mood: Provide emotional support  5. Neuropsych: This patient is capable of making decisions on her own behalf.  6. Skin/Wound Care: Dry dressing/ACE to right BK . Drainage decreasing. Local care to left BKA---almost healed  7. Fluids/Electrolytes/Nutrition: Routine I&O , reduced intake 25-50% meals, fluid intake >10052m Doesn't like renal diet- I told pt to discuss this with Nephro 8. ESRD. Continue dialysis as directed. HD to occur at the end of the day to allow for participation in rehab activities per Nephro 9. Leukocytosis. 23.9 on 6/1---down to 12.9 today, no clinical signs of infection 10. Acute on chronic anemia. Continue Aranesp. Follow-up CBC Hgb  stable 11. Hypertension. Lopressor 25 mg twice a day. Monitor with increased mobility  Vitals:   07/16/16 2024 07/17/16 0441  BP:  (!) 95/53  Pulse:  88  Resp: 20 18  Temp: 98.8 F (37.1 C) 99.8 F (37.7 C)   12. CAD/AVR. Continue chronic Coumadin per pharm protocol,INR 2.0                   14. Diabetes mellitus of peripheral neuropathy. Hemoglobin A1c 5.7.Bouts of hypoglycemia with sliding scale discontinued  contol is fair, became hypoglycemic on glimipride CBG (last 3) controlled 6/7  Recent Labs  07/16/16 2001 07/16/16 2135 07/17/16 0718  GLUCAP 87 128* 109*    15. Hypothyroidism. Synthroid  16. Mild dyspnea but no resp distress, vitals stable check CXR may have some atelectasis   LOS (Days) 7 A FACE TO FACE EVALUATION WAS PERFORMED  KIRSTEINS,ANDREW E 07/17/2016, 8:23 AM

## 2016-07-17 NOTE — Progress Notes (Addendum)
[name] INR is 2.0 today.  Continue to hold coumadin and may start heparin gtt when appropriate.  Will plan on access for Monday, 07/21/16.     Leontine Locket, [facility] 07/17/2016 9:14 AM   ADDENDUM (late entry):  Spoke with pt to explain to her that her surgery will be delayed due to elevated INR and that she will be posted for Monday.  She expressed understanding.  Leontine Locket, PAC. 07/17/2016 3:13 PM

## 2016-07-17 NOTE — Progress Notes (Signed)
  Pt found to be lethargic at lunch time and pt noted to be hypotensive at 80/50 manual bp. PA notified of this. Will continue to monitor.

## 2016-07-17 NOTE — Progress Notes (Signed)
Occupational Therapy Session Note  Patient Details  Name: [name] MRN: [id number] Date of Birth: 12-05-1955  Today's Date: 07/17/2016 OT Individual Time: 2549-8264 OT Individual Time Calculation (min): 75 min    Short Term Goals: Week 1:  OT Short Term Goal 1 (Week 1): Pt will complete clothing managment prior to toileting with supervision OT Short Term Goal 2 (Week 1): Pt will complete lower body dressing with MIN A OT Short Term Goal 3 (Week 1): Pt will maintain dynamic sitting balance for 5 min with set up OT Short Term Goal 4 (Week 1): Pt will consistantly complete slide board transfers with MOD A (or less)  Skilled Therapeutic Interventions/Progress Updates:    Pt seen for OT ADL bathing/dressing session. Pt asleep in supine upon arrival and with slightly increased time, able to be awaken and agreeable to tx session.  She transitioned to EOB with increased time using hospital bed functions. Bathing completed seated EOB with set-up and supervision.  She voiced need for toileting task, Completed anterior transfer onto [facility] requiring mod A and hospital bed elevated in order to allow for downhill sloped approach onto [facility]. Pt with increased fatigue and soreness in LEs this morning, making transfers more difficult for her this morning compared to yesterday's session. RN made aware of pt's discomfort and pain medications administered during session. She dressed seated on toilet with set-up assist, returning to supine on bed to roll to pull pants up with assist from therapist to have pants pulled completely over hips due to pt's fatigue from rolling. PT cont to use hospital bed functions to assist with bed mobility during dressing task. She completed min A sliding board transfer into w/c with assist for set-up of sliding board. Still requires min A for w/c part management.  Grooming tasks completed from w/c level at sink mod I.  Pt left seated in w/c at end of session, all needs in reach.   Pt required significantly increased time and rest breaks this session due to decreased activity tolerance and pain  Therapy Documentation Precautions:  Precautions Precautions: Fall Restrictions Weight Bearing Restrictions: Yes RLE Weight Bearing: Non weight bearing LLE Weight Bearing: Non weight bearing Pain: Pain Assessment Pain Assessment: 0-10 Pain Score: 6  Pain Type: Acute pain;Surgical pain Pain Location: Leg Pain Orientation: Right Pain Descriptors / Indicators: Aching Pain Frequency: Intermittent Pain Onset: On-going Pain Intervention(s): Medication (See eMAR), RN aware, repositioned  See Function Navigator for Current Functional Status.   Therapy/Group: Individual Therapy  Lewis, Hazel Leveille C 07/17/2016, 7:02 AM

## 2016-07-17 NOTE — Progress Notes (Signed)
Social Work Ryanne Morand, Eliezer Champagne Social Worker Signed   Patient Care Conference Date of Service: 07/16/2016  1:56 PM      Hide copied text Hover for attribution information Inpatient RehabilitationTeam Conference and Plan of Care Update Date: 07/16/2016   Time: 10:45 AM      Patient Name: [name]      Medical Record Number: [id number]  Date of Birth: [birth year] Sex: Female         Room/Bed: 4W23C/4W23C-01 Payor Info: Payor: MEDICARE / Plan: MEDICARE PART A AND B / Product Type: *No Product type* /     Admitting Diagnosis: BKA ESRD  Admit Date/Time:  07/10/2016  4:27 PM Admission Comments: No comment available    Primary Diagnosis:  Amputation of right lower extremity below knee [facility]) Principal Problem: Amputation of right lower extremity below knee [facility])       Patient Active Problem List    Diagnosis Date Noted  . Amputation of right lower extremity below knee ([place]) 07/10/2016  . History of liver transplant ([place])    . Post-operative pain    . Unilateral complete BKA, right, subsequent encounter ([place])    . S/P bilateral BKA (below knee amputation) ([place])    . Malnutrition of moderate degree 07/01/2016  . Cellulitis 06/30/2016  . PAD (peripheral artery disease) ([place]) 06/30/2016  . Diabetic foot infection ([place]) 06/30/2016  . Pressure injury of skin 05/28/2016  . Volume overload 05/28/2016  . Acute exacerbation of CHF (congestive heart failure) ([place]) 05/27/2016  . Extremity atherosclerosis with resting pain ([place])    . H/O heart valve replacement with mechanical valve    . Phantom limb pain ([place])    . Subtherapeutic international normalized ratio (INR)    . Supratherapeutic INR    . Anemia of chronic disease    . Chronic midline low back pain without sciatica    . ESRD on dialysis ([place])    . Type 2 diabetes mellitus with complication, with long-term current use of insulin ([place])    . S/P unilateral BKA (below knee amputation), left ([place]) 04/30/2016  . Unilateral complete  BKA, left, initial encounter ([place]) 04/30/2016  . Abnormality of gait    . Coronary artery disease involving coronary bypass graft of native heart without angina pectoris    . Benign essential HTN    . History of migraine    . Leukocytosis    . Critical lower limb ischemia 04/22/2016  . Foot pain 04/21/2016  . Fever    . Advance care planning    . Goals of care, counseling/discussion    . Palliative care by specialist    . Sepsis ([place])    . Hypotension    . Claudication of left lower extremity ([place])    . Elevated troponin 03/05/2016  . Pressure injury of skin, stage 2 11/27/2015  . H/O unilateral nephrectomy 07/06/2015  . Paroxysmal SVT (supraventricular tachycardia) ([place]) 06/22/2014  . Complications due to renal dialysis device, implant, and graft 05/11/2014  . Acute blood loss anemia 03/31/2014  . Renal dialysis device, implant, or graft complication 65/68/1275  . Acute hyperkalemia    . S/P liver transplant ([place])    . Chronic hepatitis C without hepatic coma ([place])    . Pulmonary edema 01/03/2014  . Respiratory failure ([place]) 01/03/2014  . Long term current use of anticoagulant therapy 04/22/2013  . Chronic anticoagulation w/ coumadin, goal INR 2.5-3.0 w/ AVR 04/14/2013  . Tobacco abuse 02/28/2013  . COPD (chronic obstructive pulmonary  disease) ([place]) 02/21/2013  . Chronic combined systolic and diastolic congestive heart failure ([place]) 02/06/2013  . Acute respiratory failure ([place]) 11/02/2012  . Acute pulmonary edema ([place]) 11/02/2012  . Acute on chronic systolic CHF (congestive heart failure) ([place]) 11/02/2012  . Dyslipidemia-LDL 104, not on statin with Hx of liver transplant 07/30/2012  . CAD (coronary artery disease), native coronary artery 07/27/2012  . History of prosthetic aortic valve replacement    . End stage renal disease on dialysis ([place]) 02/27/2011  . Hypothyroidism 06/25/2006  . Type 2 diabetes mellitus with chronic kidney disease ([place]) 06/25/2006  . GERD 06/25/2006    . Anemia due to chronic renal failure     . Hypertensive heart disease        Expected Discharge Date: Expected Discharge Date: 07/23/16   Team Members Present: Physician leading conference: Dr. Alysia Penna Social Worker Present: Ovidio Kin, LCSW Nurse Present: Other (comment) Leslee Home Evans-RN) PT Present: Barrie Folk, PT OT Present: Napoleon Form, OT SLP Present: Gunnar Fusi, SLP PPS Coordinator present : Daiva Nakayama, RN, CRRN       Current Status/Progress Goal Weekly Team Focus  Medical     Phantom pain improved, vascular access issues for HD  manage pain meds,   vascular access, AV graft placement   Bowel/Bladder     HD Patient / Oliguric/ Continent of bowel  with loose stool at this time. (refusing colace and mirlax)  remain continent of bowel and bladder when able  assess bowel and bladder q shift   Swallow/Nutrition/ Hydration               ADL's     Supervision- min A transfers and bathing/dressing, Can be mod A functional transfers depending on fatigue. Completing sliding board and A/P transfers  Supervision overall  activity tolerance, functional transfers, UE strengthening/ endurance, family training, d/c planning.    Mobility     supervision w/c mobilty, min to modA SB transfer pending fatigue, decreased endurance  min assist with transfers  sitting balance, endurance, SB transfers   Communication               Safety/Cognition/ Behavioral Observations   no unsafe behavior/calls appropriately  min assist  assess safety q shift   Pain     Rates pain to right stump 6-8/Oxy 46m  q 4hrs and Robaxin 5062m q 6 hrs  pain less than or equal to 2  assess pain q shift   Skin     Dry skin/ staples to right stump-staples,4x4's, kerlix, acewrap/hydrogel to left BKA  no new skin breakdown this admission  assess skin q shift     *See Care Plan and progress notes for long and short-term goals.   Barriers to Discharge: LUE may be limited after AVG placement      Possible Resolutions to Barriers:  see above     Discharge Planning/Teaching Needs:  HOme with daughter's assisting with her care as prior to admission. pt was here last month knows the process.      Team Discussion:  Goals of min assist wheelchair level. Decreased endurance and pain issues. AV graft tomorrow if INR ok according to MD. Pain improved with adjustment of pain meds. Sleeping better. Will need family education with daughter's soon.  Revisions to Treatment Plan:  DC 6/13    Continued Need for Acute Rehabilitation Level of Care: The patient requires daily medical management by a physician with specialized training in physical medicine and rehabilitation for the following  conditions: Daily direction of a multidisciplinary physical rehabilitation program to ensure safe treatment while eliciting the highest outcome that is of practical value to the patient.: Yes Daily medical management of patient stability for increased activity during participation in an intensive rehabilitation regime.: Yes Daily analysis of laboratory values and/or radiology reports with any subsequent need for medication adjustment of medical intervention for : Neurological problems;Other;Wound care problems   Elease Hashimoto 07/17/2016, 9:54 AM      Elease Hashimoto, LCSW Social Worker Signed   Patient Care Conference Date of Service: 05/14/2016  3:24 PM      Hide copied text Hover for attribution information Inpatient RehabilitationTeam Conference and Plan of Care Update Date: 05/14/2016   Time: 2:00 PM      Patient Name: [name]      Medical Record Number: [id number]  Date of Birth: 05-29-1955 Sex: Female         Room/Bed: 4W16C/4W16C-01 Payor Info: Payor: MEDICARE / Plan: MEDICARE PART A AND B / Product Type: *No Product type* /     Admitting Diagnosis: L BKA  Admit Date/Time:  04/30/2016  3:50 PM Admission Comments: No comment available    Primary Diagnosis:  <principal problem not  specified> Principal Problem: <principal problem not specified>       Patient Active Problem List    Diagnosis Date Noted  . Supratherapeutic INR    . Anemia of chronic disease    . Chronic midline low back pain without sciatica    . ESRD on dialysis ([place])    . Type 2 diabetes mellitus with complication, with long-term current use of insulin ([place])    . S/P unilateral BKA (below knee amputation), left ([place]) 04/30/2016  . Unilateral complete BKA, left, initial encounter ([place]) 04/30/2016  . Abnormality of gait    . Coronary artery disease involving coronary bypass graft of native heart without angina pectoris    . Benign essential HTN    . History of migraine    . Leukocytosis    . Critical lower limb ischemia 04/22/2016  . Foot pain 04/21/2016  . Fever    . Advance care planning    . Goals of care, counseling/discussion    . Palliative care by specialist    . Sepsis ([place])    . Hypotension    . Claudication of left lower extremity ([place])    . Elevated troponin 03/05/2016  . Pressure injury of skin, stage 2 11/27/2015  . H/O unilateral nephrectomy 07/06/2015  . Paroxysmal SVT (supraventricular tachycardia) ([place]) 06/22/2014  . Complications due to renal dialysis device, implant, and graft 05/11/2014  . Acute blood loss anemia 03/31/2014  . Renal dialysis device, implant, or graft complication 23/55/7322  . Acute hyperkalemia    . S/P liver transplant ([place])    . Chronic hepatitis C without hepatic coma ([place])    . Pulmonary edema 01/03/2014  . Respiratory failure ([place]) 01/03/2014  . Long term current use of anticoagulant therapy 04/22/2013  . Chronic anticoagulation w/ coumadin, goal INR 2.5-3.0 w/ AVR 04/14/2013  . Tobacco abuse 02/28/2013  . COPD (chronic obstructive pulmonary disease) ([place]) 02/21/2013  . Chronic combined systolic and diastolic congestive heart failure ([place]) 02/06/2013  . Acute respiratory failure ([place]) 11/02/2012  . Acute pulmonary edema ([place]) 11/02/2012  .  Acute on chronic systolic CHF (congestive heart failure) ([place]) 11/02/2012  . Dyslipidemia-LDL 104, not on statin with Hx of liver transplant 07/30/2012  . CAD (coronary artery disease), native coronary artery  07/27/2012  . History of prosthetic aortic valve replacement    . End stage renal disease on dialysis ([place]) 02/27/2011  . Hypothyroidism 06/25/2006  . Type 2 diabetes mellitus with chronic kidney disease ([place]) 06/25/2006  . GERD 06/25/2006  . Anemia due to chronic renal failure     . Hypertensive heart disease        Expected Discharge Date: Expected Discharge Date: 05/15/16   Team Members Present: Physician leading conference: Dr. Delice Lesch Social Worker Present: Ovidio Kin, LCSW Nurse Present: Dorthula Nettles, RN PT Present: Carney Living, PT OT Present: Willeen Cass, OT SLP Present: Stormy Fabian, SLP PPS Coordinator present : Daiva Nakayama, RN, CRRN       Current Status/Progress Goal Weekly Team Focus  Medical     Gait abnormality, difficulty with transfers, limitation with self-care secondary to left BKA  Improve mobility, transfers, pain  See above   Bowel/Bladder     Anuric. Continent of bowel 4-2  BM QD, QOD.  No s/s infection, retention. Laxatives as needed.  assess bowel patteren   Swallow/Nutrition/ Hydration               ADL's     LTGs met  Mod I/supervision from w/c level  Pt is ready for discharge   Mobility     S-min A  supervision-min A  functional mobility training, LLE therex, DC planning, family training with 2 daughters   Communication               Safety/Cognition/ Behavioral Observations             Pain     phantom pain. 27m diludid BID. Ultram 259mq 6hrs PRN  Managed at goal 3/10.  assess pain and medicate as needed   Skin     stage II coccyx. santyl ordered. Right heel boggy-deep tissue; staples to RBKA stump shinker inpalce  St. 2 resolving at discharge. No further breakdown, injury. Pt. knowledgable re: pressure release, control.   assess skin q shift. chnage dressings as ordered     *See Care Plan and progress notes for long and short-term goals.   Barriers to Discharge: CKD, Liver failure, mobility     Possible Resolutions to Barriers:  Therapies, d/c tomorrow, improving     Discharge Planning/Teaching Needs:  Daughter's were in yesterday for family education and it went well. Preparing for DC Thursday      Team Discussion:  Reaching goals of supervision-min level. Medically stable for discharge tomorrow. Pain controlled and labs stable. Ambulated 50 feet today in PT. Family education completed and ready for DC tomorrow.  Revisions to Treatment Plan:  DC tomorrow    Continued Need for Acute Rehabilitation Level of Care: The patient requires daily medical management by a physician with specialized training in physical medicine and rehabilitation for the following conditions: Daily direction of a multidisciplinary physical rehabilitation program to ensure safe treatment while eliciting the highest outcome that is of practical value to the patient.: Yes Daily medical management of patient stability for increased activity during participation in an intensive rehabilitation regime.: Yes Daily analysis of laboratory values and/or radiology reports with any subsequent need for medication adjustment of medical intervention for : Diabetes problems;Post surgical problems;Renal problems   DuElease Hashimoto/05/2016, 3:24 PM      ReElease HashimotoLCSW Social Worker Signed   Patient Care Conference Date of Service: 05/07/2016  1:19 PM      Hide copied text Hover for attribution information  Inpatient RehabilitationTeam Conference and Plan of Care Update Date: 05/07/2016   Time: 10:10 AM      Patient Name: [name]      Medical Record Number: [id number]  Date of Birth: 03-Jan-1956 Sex: Female         Room/Bed: 4W16C/4W16C-01 Payor Info: Payor: MEDICARE / Plan: MEDICARE PART A AND B / Product Type: *No Product  type* /     Admitting Diagnosis: L BKA  Admit Date/Time:  04/30/2016  3:50 PM Admission Comments: No comment available    Primary Diagnosis:  <principal problem not specified> Principal Problem: <principal problem not specified>       Patient Active Problem List    Diagnosis Date Noted  . Supratherapeutic INR    . Anemia of chronic disease    . Chronic midline low back pain without sciatica    . ESRD on dialysis ([place])    . Type 2 diabetes mellitus with complication, with long-term current use of insulin ([place])    . S/P unilateral BKA (below knee amputation), left ([place]) 04/30/2016  . Unilateral complete BKA, left, initial encounter ([place]) 04/30/2016  . Abnormality of gait    . Coronary artery disease involving coronary bypass graft of native heart without angina pectoris    . Benign essential HTN    . History of migraine    . Leukocytosis    . Critical lower limb ischemia 04/22/2016  . Foot pain 04/21/2016  . Fever    . Advance care planning    . Goals of care, counseling/discussion    . Palliative care by specialist    . Sepsis ([place])    . Hypotension    . Claudication of left lower extremity ([place])    . Elevated troponin 03/05/2016  . Pressure injury of skin, stage 2 11/27/2015  . H/O unilateral nephrectomy 07/06/2015  . Paroxysmal SVT (supraventricular tachycardia) ([place]) 06/22/2014  . Complications due to renal dialysis device, implant, and graft 05/11/2014  . Acute blood loss anemia 03/31/2014  . Renal dialysis device, implant, or graft complication 84/66/5993  . Acute hyperkalemia    . S/P liver transplant ([place])    . Chronic hepatitis C without hepatic coma ([place])    . Pulmonary edema 01/03/2014  . Respiratory failure ([place]) 01/03/2014  . Long term current use of anticoagulant therapy 04/22/2013  . Chronic anticoagulation w/ coumadin, goal INR 2.5-3.0 w/ AVR 04/14/2013  . Tobacco abuse 02/28/2013  . COPD (chronic obstructive pulmonary disease) ([place]) 02/21/2013  . Chronic  combined systolic and diastolic congestive heart failure ([place]) 02/06/2013  . Acute respiratory failure ([place]) 11/02/2012  . Acute pulmonary edema ([place]) 11/02/2012  . Acute on chronic systolic CHF (congestive heart failure) ([place]) 11/02/2012  . Dyslipidemia-LDL 104, not on statin with Hx of liver transplant 07/30/2012  . CAD (coronary artery disease), native coronary artery 07/27/2012  . History of prosthetic aortic valve replacement    . End stage renal disease on dialysis ([place]) 02/27/2011  . Hypothyroidism 06/25/2006  . Type 2 diabetes mellitus with chronic kidney disease ([place]) 06/25/2006  . GERD 06/25/2006  . Anemia due to chronic renal failure     . Hypertensive heart disease        Expected Discharge Date: Expected Discharge Date: 05/13/16   Team Members Present: Physician leading conference: Ilean Skill, PsyD;Dr. Alysia Penna Social Worker Present: Ovidio Kin, LCSW Nurse Present: Elliot Cousin, RN PT Present: Carney Living, PT OT Present: Willeen Cass, OT PPS Coordinator present : Daiva Nakayama, RN,  CRRN       Current Status/Progress Goal Weekly Team Focus  Medical     left BKA, phantom pain an issue,   maintain med stability  pain meds, trial stump shrinker   Bowel/Bladder     Anuric, HD M.W.F.,  continent bowel  BM QD, QOD.  No s/s infection, retention. Laxatives as needed.  Monitor   Swallow/Nutrition/ Hydration               ADL's     Min A overall  Mod I/supervision  pt/family education, functoinal transfers, standing balance, modified bathing/dressing, simple iADL at w/c level   Mobility     min A  supervision-min A  functional transfers, ambulation, standing balance, LLE strengthening/ROM, pain management, pt/family education   Communication     min A sit to stand and stand pivot with RW, min A for balance with LB dressing and bathing  S toileting, toilet transfer to BSC, standing with RW, bathing, LB dressing; mod I UB dressing, grooming, simple meal  prep w/c level, and housekeeping w/c level  ADL training, pain tolerance, pt/family education, psychosocial support   Safety/Cognition/ Behavioral Observations   No unsafe behaviors, calls for assist, anticipates needs.  Increased safety awareness, no falls, injury this admission.  Monitor, bed alarm.   Pain     C/O phantom and incisional pain/discomfort.  Dilaudid 85m q12hrs. PRN, scheduled robaxin effective.  Managed at goal 2/10.  Monitor, educate re: phantom pain, relief.   Skin     Stage 2 at coccyx, santyl...Rt. heel, great toe boggy, no open areas, cool to touch. Incision with scant serous drainage.  St. 2 resolving at discharge. No further breakdown, injury. Pt. knowledgable re: pressure release, control.  Continue dressing changes, boosting, education.     *See Care Plan and progress notes for long and short-term goals.   Barriers to Discharge: Debility chronic due to CKD, Liver failure     Possible Resolutions to Barriers:  cont rehab     Discharge Planning/Teaching Needs:  Home with daughter's assisting her, daughter's have been here to observe in therapies. Neuro-psych to see today for coping      Team Discussion:  Goals supervision-min assist level, tends to lose her balance posterioraly. Neuro-psych saw and pt seems to be brighter. Back on home pain meds according to MD. Will order stump shrinker. Daughter here daily participating in therapies with pt. Ramp being built on Tuesday  Revisions to Treatment Plan:  DC 4/3    Continued Need for Acute Rehabilitation Level of Care: The patient requires daily medical management by a physician with specialized training in physical medicine and rehabilitation for the following conditions: Daily direction of a multidisciplinary physical rehabilitation program to ensure safe treatment while eliciting the highest outcome that is of practical value to the patient.: Yes Daily medical management of patient stability for increased activity  during participation in an intensive rehabilitation regime.: Yes Daily analysis of laboratory values and/or radiology reports with any subsequent need for medication adjustment of medical intervention for : Neurological problems;Diabetes problems;Post surgical problems   DElease Hashimoto3/30/2018, 10:40 AM       Patient ID: LGlenda [name] female   DOB: 7Oct 10, 1957 61y.o.   MRN: [id number]

## 2016-07-17 NOTE — Progress Notes (Signed)
ANTICOAGULATION CONSULT NOTE - Follow Up Consult  Pharmacy Consult for heparin Indication: mechanical heart valve  Allergies  Allergen Reactions  . Acetaminophen Other (See Comments)    Liver transplant recipient   . Codeine Itching  . Mirtazapine Other (See Comments)    hallucination    Patient Measurements: Height: 5\' 4"  (162.6 cm) Weight: 125 lb 10.6 oz (57 kg) IBW/kg (Calculated) : 54.7 Heparin Dosing Weight:   Vital Signs: Temp: 99.8 F (37.7 C) (06/07 0441) Temp Source: Oral (06/07 0441) BP: 81/56 (06/07 1258) Pulse Rate: 88 (06/07 0441)  Labs:  Recent Labs  07/14/16 1530  07/15/16 0508 07/16/16 0531 07/16/16 1553 07/17/16 0603  HGB  --   < > 7.8* 7.7* 7.6* 7.4*  HCT  --   < > 22.8* 22.6* 23.0* 22.8*  PLT  --   < > 154 178 212 230  LABPROT  --   --  22.1* 23.7*  --  23.0*  INR  --   --  1.90 2.07  --  2.00  CREATININE 6.35*  --   --   --  5.56* 3.10*  < > = values in this interval not displayed.  Estimated Creatinine Clearance: 16.5 mL/min (A) (by C-G formula based on SCr of 3.1 mg/dL (H)).   Medications:  Scheduled:  . aspirin EC  81 mg Oral Daily  . calcitRIOL  0.5 mcg Oral Q M,W,F-HD  . calcium acetate  667 mg Oral TID WC  . darbepoetin (ARANESP) injection - DIALYSIS  200 mcg Intravenous Q Mon-HD  . docusate sodium  100 mg Oral Daily  . feeding supplement  1 Container Oral TID BM  . gabapentin  300 mg Oral QHS  . levothyroxine  175 mcg Oral QAC breakfast  . metoCLOPramide  5 mg Oral BID AC  . metoprolol tartrate  12.5 mg Oral BID  . multivitamin  1 tablet Oral QHS  . polyethylene glycol  17 g Oral BID  . senna-docusate  1 tablet Oral BID  . tacrolimus  2 mg Oral BID  . Warfarin - Pharmacist Dosing Inpatient   Does not apply q1800   Infusions:  .  ceFAZolin (ANCEF) IV    . ferric gluconate (FERRLECIT/NULECIT) IV    . heparin      Assessment: 61 yo female with mechanical heart valve is going to be transitioned to heparin pending HD access  procedure on 07/21/16.  INR today is 2 (goal INR while on coumadin was 2.5-3.5).   Goal of Therapy:  Heparin level 0.3-0.7 units/ml Monitor platelets by anticoagulation protocol: Yes   Plan:  - restart heparin at 1050 units/hr (previous therapeutic rate) - 8hr heparin level  - daily heparin level and CBC  Adriane Gabbert, Tsz-Yin 07/17/2016,1:24 PM

## 2016-07-17 NOTE — Progress Notes (Signed)
Occupational Therapy Note  Patient Details  Name: [name] MRN: [id number] Date of Birth: 1955-06-08  Therapist returned at 75 to attempt make-up time. Pt off unit for procedure. Therapist returned at 1300, pt asleep in supine, difficult to arouse. Offered lunch and pt stating "yes, I need to eat for energy" upon therapist return pt back asleep. Left meal tray in reach. Will attempt to make up as able, cont. [place], Loraine C 07/17/2016, 3:08 PM

## 2016-07-18 ENCOUNTER — Inpatient Hospital Stay (HOSPITAL_COMMUNITY): Payer: Medicare Other | Admitting: Physical Therapy

## 2016-07-18 ENCOUNTER — Inpatient Hospital Stay (HOSPITAL_COMMUNITY): Payer: Medicare Other

## 2016-07-18 ENCOUNTER — Inpatient Hospital Stay (HOSPITAL_COMMUNITY): Payer: Medicare Other | Admitting: Occupational Therapy

## 2016-07-18 LAB — CBC
HCT: 22.1 % — ABNORMAL LOW (ref 36.0–46.0)
HEMATOCRIT: 25.9 % — AB (ref 36.0–46.0)
HEMOGLOBIN: 8.3 g/dL — AB (ref 12.0–15.0)
Hemoglobin: 7.1 g/dL — ABNORMAL LOW (ref 12.0–15.0)
MCH: 29.5 pg (ref 26.0–34.0)
MCH: 29.3 pg (ref 26.0–34.0)
MCHC: 32 g/dL (ref 30.0–36.0)
MCHC: 32.1 g/dL (ref 30.0–36.0)
MCV: 92.2 fL (ref 78.0–100.0)
MCV: 91.3 fL (ref 78.0–100.0)
PLATELETS: 259 10*3/uL (ref 150–400)
Platelets: 282 10*3/uL (ref 150–400)
RBC: 2.81 MIL/uL — ABNORMAL LOW (ref 3.87–5.11)
RBC: 2.42 MIL/uL — ABNORMAL LOW (ref 3.87–5.11)
RDW: 23.8 % — ABNORMAL HIGH (ref 11.5–15.5)
RDW: 23.4 % — ABNORMAL HIGH (ref 11.5–15.5)
WBC: 10.7 10*3/uL — ABNORMAL HIGH (ref 4.0–10.5)
WBC: 10.9 10*3/uL — ABNORMAL HIGH (ref 4.0–10.5)

## 2016-07-18 LAB — HEPARIN LEVEL (UNFRACTIONATED)
HEPARIN UNFRACTIONATED: 0.14 [IU]/mL — AB (ref 0.30–0.70)
HEPARIN UNFRACTIONATED: 0.65 [IU]/mL (ref 0.30–0.70)

## 2016-07-18 LAB — GLUCOSE, CAPILLARY
GLUCOSE-CAPILLARY: 127 mg/dL — AB (ref 65–99)
Glucose-Capillary: 183 mg/dL — ABNORMAL HIGH (ref 65–99)
Glucose-Capillary: 112 mg/dL — ABNORMAL HIGH (ref 65–99)

## 2016-07-18 LAB — RENAL FUNCTION PANEL
ANION GAP: 10 (ref 5–15)
Albumin: 1.6 g/dL — ABNORMAL LOW (ref 3.5–5.0)
BUN: 14 mg/dL (ref 6–20)
CALCIUM: 8.5 mg/dL — AB (ref 8.9–10.3)
CO2: 26 mmol/L (ref 22–32)
CREATININE: 4.61 mg/dL — AB (ref 0.44–1.00)
Chloride: 96 mmol/L — ABNORMAL LOW (ref 101–111)
GFR calc Af Amer: 11 mL/min — ABNORMAL LOW (ref >60)
GFR calc non Af Amer: 9 mL/min — ABNORMAL LOW (ref >60)
GLUCOSE: 262 mg/dL — AB (ref 65–99)
Phosphorus: 5.9 mg/dL — ABNORMAL HIGH (ref 2.5–4.6)
Potassium: 3.4 mmol/L — ABNORMAL LOW (ref 3.5–5.1)
SODIUM: 132 mmol/L — AB (ref 135–145)

## 2016-07-18 LAB — PROTIME-INR
INR: 1.67
Prothrombin Time: 19.9 seconds — ABNORMAL HIGH (ref 11.4–15.2)

## 2016-07-18 MED ORDER — OXYCODONE HCL 5 MG PO TABS
5.0000 mg | ORAL_TABLET | Freq: Once | ORAL | Status: AC
  Administered 2016-07-18: 5 mg via ORAL
  Filled 2016-07-18: qty 1

## 2016-07-18 MED ORDER — OXYCODONE HCL 5 MG PO TABS
10.0000 mg | ORAL_TABLET | ORAL | Status: DC | PRN
  Administered 2016-07-18 – 2016-07-24 (×21): 10 mg via ORAL
  Filled 2016-07-18 (×21): qty 2

## 2016-07-18 NOTE — Progress Notes (Signed)
ANTICOAGULATION CONSULT NOTE - Follow Up Consult  Pharmacy Consult for Heparin Indication: mechanical AVR  Allergies  Allergen Reactions  . Acetaminophen Other (See Comments)    Liver transplant recipient   . Codeine Itching  . Mirtazapine Other (See Comments)    hallucination    Patient Measurements: Height: 5\' 4"  (162.6 cm) Weight: 117 lb 1 oz (53.1 kg) IBW/kg (Calculated) : 54.7 Heparin Dosing Weight: 54.9 kg  Vital Signs: Temp: 98.4 F (36.9 C) (06/08 1934) Temp Source: Oral (06/08 1934) BP: 116/76 (06/08 1934) Pulse Rate: 86 (06/08 1934)  Labs:  Recent Labs  07/16/16 0531 07/16/16 1553 07/17/16 0603 07/17/16 2200 07/18/16 0742 07/18/16 1630 07/18/16 1930  HGB 7.7* 7.6* 7.4*  --  8.3* 7.1*  --   HCT 22.6* 23.0* 22.8*  --  25.9* 22.1*  --   PLT 178 212 230  --  282 259  --   LABPROT 23.7*  --  23.0*  --  19.9*  --   --   INR 2.07  --  2.00  --  1.67  --   --   HEPARINUNFRC  --   --   --  <0.10* 0.14*  --  0.65  CREATININE  --  5.56* 3.10*  --   --  4.61*  --     Estimated Creatinine Clearance: 10.7 mL/min (A) (by C-G formula based on SCr of 4.61 mg/dL (H)).   Medications:  Infusions:  . ferric gluconate (FERRLECIT/NULECIT) IV    . heparin 1,400 Units/hr (07/18/16 1357)    Assessment: 61 yo female with mechanical AVR on warfarin PTA which is being held for a procedure on 6/11. She is currently on IV heparin. Heparin level is therapeutic at 0.65. No bleeding noted.  Goal of Therapy:  Heparin level 0.3-0.7 units/ml Monitor platelets by anticoagulation protocol: Yes   Plan:  - Continue heparin drip at 1400 units/hr - Confirmatory heparin level with am labs - Monitor for s/sx of bleeding   Renold Genta, PharmD, BCPS Clinical Pharmacist Phone for tonight - [place] - [phone] 07/18/2016 8:10 PM

## 2016-07-18 NOTE — Progress Notes (Signed)
Social Work Patient ID: [name], female   DOB: 28-Nov-1955, 61 y.o.   MRN: [id number]  Met with pt to discuss team conference goals min assist level and the need for her daughter;s to come in and go through therapies with Her prior to discharge 6/13, if medically stable. She reports her daughter's have been assisting her prior to this, but will have them come in for therapies. She hopes to have the procedure Monday and does go home Wed. Last time when she was here her INR was not at a level she could. Will see how Monday procedure goes.

## 2016-07-18 NOTE — Progress Notes (Signed)
Subjective:   No new events AVG postponed 2/2 INR = 2 For HD later today  Objective Vital signs in last 24 hours: Vitals:   07/17/16 1530 07/17/16 2140 07/18/16 0500 07/18/16 0725  BP: (!) 85/60 120/64  103/60  Pulse: 76 78  81  Resp: 18   20  Temp: 98.6 F (37 C)   99.3 F (37.4 C)  TempSrc: Oral   Oral  SpO2: 100%   (!) 86%  Weight:   53.3 kg (117 lb 8.1 oz) 54.9 kg (121 lb 0.5 oz)  Height:       Weight change: -1.8 kg (-3 lb 15.5 oz)  Physical Exam: General: alert in wheelchair in Room Heart: RRR   Lungs: CTA Abdomen: soft nt, nd Extremities:Bilat BKA , New on R clean dry bandage   Dialysis Acces-R IJ Perm cath   Dialysis Orders:  [facility] MWF - right IJ 3.5h 180F  BFR 450/A1.5x  2K/2Ca  Na Linear  EDW 50 kg No Heparin -Calcitriol 0.5 mcg PO q HD -Mircera 50 mcg IV q 4 weeks   Background: 61 y.o.femalewith ESRD on HD, HTN, DM, CAD, chronic back pain, 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 Scot Dock. 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 Arteriogram no surgically correctable dx.     Problem/Plan: 1. PAD/R foot gangrene/recent L BKA (not healed) - s/p R BKA 5/29  in CIR 2. ESRD - MWF via [facility] for AVG LUE today 6/11; HD on MWF schedule; 3K no heparin 3. Anemia - max ESA, monitor 4. Metabolic bone disease - Cont VDRA/Ca acetate binder  5. Severe protein malnutrition - Renal diet/vitamins/ supplements - on regular diet to promote intake- intake poor 6. S/p mechanical AVR and TDC -- for AVG LUE 6/11 7. H/o liver transplant - on Prograf 8. DM - per primary  9.   BP/volume - Cont to probe for new lower EDW  R Deforrest Bogle MD 07/18/2016,9:08 AM  LOS: 8 days   Labs: Basic Metabolic Panel:  Recent Labs Lab 07/13/16 0654 07/14/16 1530 07/16/16 1553 07/17/16 0603  NA 130* 127* 128* 132*  K 3.6 3.8 3.5 3.5  CL 95* 93* 93* 96*  CO2 23 23 24 26    GLUCOSE 125* 207* 288* 134*  BUN 10 20 17 7   CREATININE 4.69* 6.35* 5.56* 3.10*  CALCIUM 8.9 8.9 9.0 8.6*  PHOS 6.1* 7.1* 6.5*  --    Liver Function Tests:  Recent Labs Lab 07/13/16 0654 07/14/16 1530 07/16/16 1553  AST 79*  --   --   ALT 21  --   --   ALKPHOS 352*  --   --   BILITOT 4.0*  --   --   PROT 6.4*  --   --   ALBUMIN 1.6* 1.7* 1.7*   No results for input(s): LIPASE, AMYLASE in the last 168 hours. No results for input(s): AMMONIA in the last 168 hours. CBC:  Recent Labs Lab 07/15/16 0508 07/16/16 0531 07/16/16 1553 07/17/16 0603 07/18/16 0742  WBC 11.7* 11.8* 11.9* 11.9* 10.7*  HGB 7.8* 7.7* 7.6* 7.4* 8.3*  HCT 22.8* 22.6* 23.0* 22.8* 25.9*  MCV 86.0 88.3 88.8 89.4 92.2  PLT 154 178 212 230 282   Cardiac Enzymes: No results for input(s): CKTOTAL, CKMB, CKMBINDEX, TROPONINI in the last 168 hours. CBG:  Recent Labs Lab 07/16/16 2135 07/17/16 0718 07/17/16 1146 07/17/16 1632 07/18/16 0754  GLUCAP 128*  109* 164* 100* 127*    Studies/Results: Dg Chest 2 View  Result Date: 07/17/2016 CLINICAL DATA:  Shortness of breath. History of liver transplant, coronary artery disease, valvular heart disease, dialysis dependent renal failure. EXAM: CHEST  2 VIEW COMPARISON:  Chest x-ray of May 28, 2016 FINDINGS: The left lung is adequately inflated. On the right there is mild volume loss slightly more conspicuous today. A moderate size right pleural effusion is present and has increased slightly since the previous study. The heart is mildly enlarged. There is a prosthetic aortic valve. The sternal wires are intact. There is calcification in the wall of the aortic arch. The central pulmonary vascularity is prominent. The dialysis catheter tip projects at the cavoatrial junction. IMPRESSION: Slight interval increase in the size of the right pleural effusion such that it is moderate in size. A small left pleural effusion is stable. There is mild interstitial prominence  bilaterally compatible with edema. Stable mild cardiomegaly with central pulmonary vascular congestion. Electronically Signed   By: David  Martinique M.D.   On: 07/17/2016 10:10   Medications: . ferric gluconate (FERRLECIT/NULECIT) IV    . heparin 1,250 Units/hr (07/18/16 0010)   . aspirin EC  81 mg Oral Daily  . calcitRIOL  0.5 mcg Oral Q M,W,F-HD  . calcium acetate  667 mg Oral TID WC  . darbepoetin (ARANESP) injection - DIALYSIS  200 mcg Intravenous Q Mon-HD  . docusate sodium  100 mg Oral Daily  . feeding supplement  1 Container Oral TID BM  . gabapentin  300 mg Oral QHS  . levothyroxine  175 mcg Oral QAC breakfast  . metoCLOPramide  5 mg Oral BID AC  . metoprolol tartrate  12.5 mg Oral BID  . multivitamin  1 tablet Oral QHS  . polyethylene glycol  17 g Oral BID  . senna-docusate  1 tablet Oral BID  . tacrolimus  2 mg Oral BID

## 2016-07-18 NOTE — Progress Notes (Signed)
Occupational Therapy Note  Patient Details  Name: [name] MRN: [id number] Date of Birth: 29-Aug-1955  Today's Date: 07/18/2016 OT Individual Time: 1000-1055 OT Individual Time Calculation (min): 55 min   Pt c/o 8/10 pain in RLE; RN aware and meds admin during session Individual Therapy  Pt resting in w/c upon arrival.  Pt stated she was trying to take a nap but agreeable to therapy.  Initial discussion with pt regarding her goals and where she needs to improve.  Pt stated that she needed to work on transfers w/c<>bed and bed <>BSC.  Pt also stated that she needed to work on her UB strengthening and endurance.  Pt stated that phantom pain was so intense she was hesitant to practice transfers. Pt engaged in BUE therex with 1kg ball and 2# dumbbells.  Pt attempted w/c pushups but was unsuccessful.  Pt remained in w/c with family present.    Leotis Shames [facility] 07/18/2016, 12:07 PM

## 2016-07-18 NOTE — Progress Notes (Signed)
Physical Therapy Weekly Progress Note  Patient Details  Name: [name] MRN: [id number] Date of Birth: Jul 08, 1955  Beginning of progress report period: July 11, 2016 End of progress report period: July 18, 2016  Today's Date: 07/18/2016 PT Individual Time: 0802-0900 AND 1430-1500 PT Individual Time Calculation (min): 58 min AND 30 min   Patient has met 1 of 1 short term goals. Pt LOS longer than PT anticipated due to placement of fistula in the RUE, and potential affect on functional status. Pt demonstrates steady progress towards PT goals. Pt has demonstrated intermittent min-supervision assist with bed mobility. Min assist required for SB transfers for set up and min-mod cues for safety and sequencing. Supervision assist with WC use to improve safety with doorway management.   Patient continues to demonstrate the following deficits muscle weakness and muscle joint tightness, decreased cardiorespiratoy endurance, decreased problem solving, decreased safety awareness, decreased memory and delayed processing and decreased sitting balance and decreased balance strategies and therefore will continue to benefit from skilled PT intervention to increase functional independence with mobility.  Patient progressing toward long term goals..  Continue plan of care.  PT Short Term Goals Week 1:  PT Short Term Goal 1 (Week 1): STG=LTG to to estimated length of stay  PT Short Term Goal 1 - Progress (Week 1): Progressing toward goal Week 2:  PT Short Term Goal 1 (Week 2): STG=LTG due to ELOS  Skilled Therapeutic Interventions/Progress Updates:   Pt received supine in bed and agreeable to PT. Supine>sit transfer with min assist and  Max cues for technique.  SB transfer to [facility] with min assist for set up and WC parts management. WC mobility performed multiple times throughout treatment with distantant supervision assist and min cues for doorway management and proper WC positioning for transfer set up. SB  transfer to mat table with min assist for set up. Sit<>supine completed from firm surface with supervisoin assist and mod cues for proper UE placement.  Supine therex:  SAQ x 12 BLE,  Hip abduction/adduction x 12 BLE SLR x 12 BLE. Bridge with press through thighs on bolster x 8 Rolling R and L x 4 Bilaterally with supervision assist with mod cue progressing to min cues to sequencing.   PT attempted to set up Tens to decrease phantom pain. Tens Unit malfunctioned after 1 minute on thigh. Pt reports no change in phantom sensation in R residual limb.   Patient returned too room and left sitting in [facility] with call bell in reach and all needs met.    Session 2.  Pt received supine in bed and agreeable to PT. Supine UE and LE therex.  Chest press with 4lb bar x 15 Chest press with 1kg ball.  tricep extension with level 2 tband.  Lat pull down with level 2 tband.   Hip flexion extension x 12 BLE>  SAQ with BLE on towel roll.  Hip abduction with liht manual resistance from PT.   Pt left supine in bed with call bell in reach with all needs met.       Therapy Documentation Precautions:  Precautions Precautions: Fall Restrictions Weight Bearing Restrictions: Yes RLE Weight Bearing: Non weight bearing LLE Weight Bearing: Non weight bearing General:   Vital Signs: Therapy Vitals Temp: 99.3 F (37.4 C) Temp Source: Oral Pulse Rate: 81 Resp: 20 BP: 103/60 Patient Position (if appropriate): Lying Oxygen Therapy SpO2: (!) 86 % (rn notified) Pain: Pain Assessment Pain Assessment: 0-10 Pain Score: 8  Pain Type:  Phantom pain Pain Location: Leg Pain Orientation: Right Pain Descriptors / Indicators: Aching Patients Stated Pain Goal: 4 Pain Intervention(s): Medication (See eMAR)   See Function Navigator for Current Functional Status.  Therapy/Group: Individual Therapy  [name] 07/18/2016, 11:01 AM

## 2016-07-18 NOTE — Progress Notes (Addendum)
Subjective/Complaints: No issues overnite, CXR results reviewed no SOB, is propelling WC with help for IV pole Off warfarin  and on IV heparin in preparation for AV graft ROS: pt denies nausea, vomiting, diarrhea, cough, shortness of breath or chest pain   Objective: Vital Signs: Blood pressure 103/60, pulse 81, temperature 99.3 F (37.4 C), temperature source Oral, resp. rate 20, height _0  (1.626 m), weight 54.9 kg (121 lb 0.5 oz), SpO2 (!) 86 %. Dg Chest 2 View  Result Date: 07/17/2016 CLINICAL DATA:  Shortness of breath. History of liver transplant, coronary artery disease, valvular heart disease, dialysis dependent renal failure. EXAM: CHEST  2 VIEW COMPARISON:  Chest x-ray of May 28, 2016 FINDINGS: The left lung is adequately inflated. On the right there is mild volume loss slightly more conspicuous today. A moderate size right pleural effusion is present and has increased slightly since the previous study. The heart is mildly enlarged. There is a prosthetic aortic valve. The sternal wires are intact. There is calcification in the wall of the aortic arch. The central pulmonary vascularity is prominent. The dialysis catheter tip projects at the cavoatrial junction. IMPRESSION: Slight interval increase in the size of the right pleural effusion such that it is moderate in size. A small left pleural effusion is stable. There is mild interstitial prominence bilaterally compatible with edema. Stable mild cardiomegaly with central pulmonary vascular congestion. Electronically Signed   By: David  Martinique M.D.   On: 07/17/2016 10:10   Results for orders placed or performed during the hospital encounter of 07/10/16 (from the past 72 hour(s))  Glucose, capillary     Status: Abnormal   Collection Time: 07/15/16 11:42 AM  Result Value Ref Range   Glucose-Capillary 167 (H) 65 - 99 mg/dL  Glucose, capillary     Status: Abnormal   Collection Time: 07/15/16  4:40 PM  Result Value Ref Range    Glucose-Capillary 193 (H) 65 - 99 mg/dL  Glucose, capillary     Status: Abnormal   Collection Time: 07/15/16  8:53 PM  Result Value Ref Range   Glucose-Capillary 193 (H) 65 - 99 mg/dL  Protime-INR     Status: Abnormal   Collection Time: 07/16/16  5:31 AM  Result Value Ref Range   Prothrombin Time 23.7 (H) 11.4 - 15.2 seconds   INR 2.07   CBC     Status: Abnormal   Collection Time: 07/16/16  5:31 AM  Result Value Ref Range   WBC 11.8 (H) 4.0 - 10.5 K/uL    Comment: WHITE COUNT CONFIRMED ON SMEAR   RBC 2.56 (L) 3.87 - 5.11 MIL/uL   Hemoglobin 7.7 (L) 12.0 - 15.0 g/dL    Comment: CONSISTENT WITH PREVIOUS RESULT   HCT 22.6 (L) 36.0 - 46.0 %   MCV 88.3 78.0 - 100.0 fL   MCH 30.1 26.0 - 34.0 pg   MCHC 34.1 30.0 - 36.0 g/dL   RDW 22.8 (H) 11.5 - 15.5 %   Platelets 178 150 - 400 K/uL    Comment: REPEATED TO VERIFY PLATELET COUNT CONFIRMED BY SMEAR   Glucose, capillary     Status: Abnormal   Collection Time: 07/16/16  6:45 AM  Result Value Ref Range   Glucose-Capillary 113 (H) 65 - 99 mg/dL  Glucose, capillary     Status: Abnormal   Collection Time: 07/16/16 12:09 PM  Result Value Ref Range   Glucose-Capillary 103 (H) 65 - 99 mg/dL  CBC     Status: Abnormal  Collection Time: 07/16/16  3:53 PM  Result Value Ref Range   WBC 11.9 (H) 4.0 - 10.5 K/uL    Comment: WHITE COUNT CONFIRMED ON SMEAR   RBC 2.59 (L) 3.87 - 5.11 MIL/uL   Hemoglobin 7.6 (L) 12.0 - 15.0 g/dL   HCT 23.0 (L) 36.0 - 46.0 %   MCV 88.8 78.0 - 100.0 fL   MCH 29.3 26.0 - 34.0 pg   MCHC 33.0 30.0 - 36.0 g/dL   RDW 22.0 (H) 11.5 - 15.5 %   Platelets 212 150 - 400 K/uL    Comment: PLATELET COUNT CONFIRMED BY SMEAR  Renal function panel     Status: Abnormal   Collection Time: 07/16/16  3:53 PM  Result Value Ref Range   Sodium 128 (L) 135 - 145 mmol/L   Potassium 3.5 3.5 - 5.1 mmol/L   Chloride 93 (L) 101 - 111 mmol/L   CO2 24 22 - 32 mmol/L   Glucose, Bld 288 (H) 65 - 99 mg/dL   BUN 17 6 - 20 mg/dL    Creatinine, Ser 5.56 (H) 0.44 - 1.00 mg/dL   Calcium 9.0 8.9 - 10.3 mg/dL   Phosphorus 6.5 (H) 2.5 - 4.6 mg/dL   Albumin 1.7 (L) 3.5 - 5.0 g/dL   GFR calc non Af Amer 7 (L) >60 mL/min   GFR calc Af Amer 9 (L) >60 mL/min    Comment: (NOTE) The eGFR has been calculated using the CKD EPI equation. This calculation has not been validated in all clinical situations. eGFR's persistently <60 mL/min signify possible Chronic Kidney Disease.    Anion gap 11 5 - 15  Glucose, capillary     Status: None   Collection Time: 07/16/16  8:01 PM  Result Value Ref Range   Glucose-Capillary 87 65 - 99 mg/dL  Glucose, capillary     Status: Abnormal   Collection Time: 07/16/16  9:35 PM  Result Value Ref Range   Glucose-Capillary 128 (H) 65 - 99 mg/dL  Protime-INR     Status: Abnormal   Collection Time: 07/17/16  6:03 AM  Result Value Ref Range   Prothrombin Time 23.0 (H) 11.4 - 15.2 seconds   INR 2.00   CBC     Status: Abnormal   Collection Time: 07/17/16  6:03 AM  Result Value Ref Range   WBC 11.9 (H) 4.0 - 10.5 K/uL    Comment: WHITE COUNT CONFIRMED ON SMEAR   RBC 2.55 (L) 3.87 - 5.11 MIL/uL   Hemoglobin 7.4 (L) 12.0 - 15.0 g/dL   HCT 22.8 (L) 36.0 - 46.0 %   MCV 89.4 78.0 - 100.0 fL   MCH 29.0 26.0 - 34.0 pg   MCHC 32.5 30.0 - 36.0 g/dL   RDW 22.4 (H) 11.5 - 15.5 %   Platelets 230 150 - 400 K/uL    Comment: PLATELET COUNT CONFIRMED BY SMEAR  Basic metabolic panel     Status: Abnormal   Collection Time: 07/17/16  6:03 AM  Result Value Ref Range   Sodium 132 (L) 135 - 145 mmol/L   Potassium 3.5 3.5 - 5.1 mmol/L   Chloride 96 (L) 101 - 111 mmol/L   CO2 26 22 - 32 mmol/L   Glucose, Bld 134 (H) 65 - 99 mg/dL   BUN 7 6 - 20 mg/dL   Creatinine, Ser 3.10 (H) 0.44 - 1.00 mg/dL    Comment: DELTA CHECK NOTED   Calcium 8.6 (L) 8.9 - 10.3 mg/dL   GFR calc  non Af Amer 15 (L) >60 mL/min   GFR calc Af Amer 18 (L) >60 mL/min    Comment: (NOTE) The eGFR has been calculated using the CKD EPI  equation. This calculation has not been validated in all clinical situations. eGFR's persistently <60 mL/min signify possible Chronic Kidney Disease.    Anion gap 10 5 - 15  Glucose, capillary     Status: Abnormal   Collection Time: 07/17/16  7:18 AM  Result Value Ref Range   Glucose-Capillary 109 (H) 65 - 99 mg/dL  Glucose, capillary     Status: Abnormal   Collection Time: 07/17/16 11:46 AM  Result Value Ref Range   Glucose-Capillary 164 (H) 65 - 99 mg/dL  Glucose, capillary     Status: Abnormal   Collection Time: 07/17/16  4:32 PM  Result Value Ref Range   Glucose-Capillary 100 (H) 65 - 99 mg/dL  Heparin level (unfractionated)     Status: Abnormal   Collection Time: 07/17/16 10:00 PM  Result Value Ref Range   Heparin Unfractionated <0.10 (L) 0.30 - 0.70 IU/mL    Comment:        IF HEPARIN RESULTS ARE BELOW EXPECTED VALUES, AND PATIENT DOSAGE HAS BEEN CONFIRMED, SUGGEST FOLLOW UP TESTING OF ANTITHROMBIN III LEVELS. REPEATED TO VERIFY   Glucose, capillary     Status: Abnormal   Collection Time: 07/18/16  7:54 AM  Result Value Ref Range   Glucose-Capillary 127 (H) 65 - 99 mg/dL     HEENT: normal and poor dentition Cardio:tachycardia no murmur Resp:CTA B GI: BS positive Extremity:  Edema R BKA stump improved Skin:   Other R BKA incision clean and intact with minimal serous drainage on pad no active Neuro: Alert/Oriented and Abnormal Motor 4+/5 in BUE and L HF, R HF 3,  3- B KE Musc/Skel: no hypersensitivity to touch on R distal stump Gen NAD   Assessment/Plan: 1. Functional deficits secondary to New right and recent left BKA which require 3+ hours per day of interdisciplinary therapy in a comprehensive inpatient rehab setting. Physiatrist is providing close team supervision and 24 hour management of active medical problems listed below. Physiatrist and rehab team continue to assess barriers to discharge/monitor patient progress toward functional and medical  goals. FIM: Function - Bathing Position: Sitting EOB Body parts bathed by patient: Right arm, Left arm, Chest, Abdomen, Front perineal area, Buttocks, Right upper leg, Left upper leg Body parts bathed by helper: Back Bathing not applicable: Left lower leg, Right lower leg Assist Level: Supervision or verbal cues  Function- Upper Body Dressing/Undressing What is the patient wearing?: Pull over shirt/dress Pull over shirt/dress - Perfomed by patient: Thread/unthread right sleeve, Thread/unthread left sleeve, Put head through opening, Pull shirt over trunk Assist Level: Set up Set up : To obtain clothing/put away Function - Lower Body Dressing/Undressing What is the patient wearing?: Underwear, Pants Position: Bed Underwear - Performed by patient: Thread/unthread right underwear leg, Thread/unthread left underwear leg, Pull underwear up/down Pants- Performed by patient: Thread/unthread right pants leg, Thread/unthread left pants leg Pants- Performed by helper: Pull pants up/down Assist for lower body dressing: Touching or steadying assistance (Pt > 75%)  Function - Toileting Toileting activity did not occur: No continent bowel/bladder event Toileting steps completed by patient: Adjust clothing prior to toileting, Performs perineal hygiene, Adjust clothing after toileting Toileting steps completed by helper: Adjust clothing prior to toileting, Adjust clothing after toileting Toileting Assistive Devices: Grab bar or rail Assist level: Supervision or verbal cues  Function -  Air cabin crew transfer activity did not occur:  (not attempted) Toilet transfer assistive device: Bedside commode Assist level to toilet: Maximal assist (Pt 25 - 49%/lift and lower) Assist level from toilet: Moderate assist (Pt 50 - 74%/lift or lower) Assist level to bedside commode (at bedside): Moderate assist (Pt 50 - 74%/lift or lower) Assist level from bedside commode (at bedside): Supervision or verbal  cues  Function - Chair/bed transfer Chair/bed transfer activity did not occur: N/A Chair/bed transfer method: Lateral scoot Chair/bed transfer assist level: Touching or steadying assistance (Pt > 75%) Chair/bed transfer assistive device: Sliding board, Armrests  Function - Locomotion: Wheelchair Type: Manual Max wheelchair distance: 156f  Assist Level: Supervision or verbal cues Assist Level: Supervision or verbal cues Assist Level: Supervision or verbal cues Turns around,maneuvers to table,bed, and toilet,negotiates 3% grade,maneuvers on rugs and over doorsills: No Function - Locomotion: Ambulation Ambulation activity did not occur: Safety/medical concerns Walk 10 feet activity did not occur: Safety/medical concerns Walk 50 feet with 2 turns activity did not occur: Safety/medical concerns Walk 150 feet activity did not occur: Safety/medical concerns Walk 10 feet on uneven surfaces activity did not occur: Safety/medical concerns  Function - Comprehension Comprehension: Auditory Comprehension assist level: Understands complex 90% of the time/cues 10% of the time  Function - Expression Expression: Verbal Expression assist level: Expresses complex 90% of the time/cues < 10% of the time  Function - Social Interaction Social Interaction assist level: Interacts appropriately 90% of the time - Needs monitoring or encouragement for participation or interaction.  Function - Problem Solving Problem solving assist level: Solves basic 90% of the time/requires cueing < 10% of the time  Function - Memory Memory assist level: Recognizes or recalls 90% of the time/requires cueing < 10% of the time Patient normally able to recall (first 3 days only): Current season, That he or she is in a hospital    Medical Problem List and Plan:  1. 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  CIR PT, OT, -pt has KI for RLE. Needs to  use---doing better with right knee extension   Need access for HD, on IV heparin pending AV graft for Monday 6/11 -  2. DVT Prophylaxis/Anticoagulation: Chronic Coumadin therapy for history of mechanical aortic valve  3. Pain Management: Gabapentin for phantom pain, not as effective last noc Trial TENs in therapy   4. Mood: Provide emotional support  5. Neuropsych: This patient is capable of making decisions on her own behalf.  6. Skin/Wound Care: Dry dressing/ACE to right BK . Drainage decreasing. Local care to left BKA---almost healed  7. Fluids/Electrolytes/Nutrition: Routine I&O , reduced intake 25-50% meals, fluid intake >10065m Doesn't like renal diet- I told pt to discuss this with Nephro 8. ESRD. Continue dialysis as directed. HD to occur at the end of the day to allow for participation in rehab activities per Nephro 9. Leukocytosis. 23.9 on 6/1---down to 12.9 today, no clinical signs of infection 10. Acute on chronic anemia. Continue Aranesp. Follow-up CBC Hgb stable 11. Hypertension. Lopressor 25 mg twice a day. Monitor with increased mobility  Vitals:   07/17/16 2140 07/18/16 0725  BP: 120/64 103/60  Pulse: 78 81  Resp:  20  Temp:  99.3 F (37.4 C)   12. CAD/AVR. Continue chronic Coumadin per pharm protocol,INR 2.0                   14. Diabetes mellitus of peripheral neuropathy.  Hemoglobin A1c 5.7.Bouts of hypoglycemia with sliding scale discontinued  contol is fair, became hypoglycemic on glimipride CBG (last 3) controlled 6/7  Recent Labs  07/17/16 1146 07/17/16 1632 07/18/16 0754  GLUCAP 164* 100* 127*    15. Hypothyroidism. Synthroid  16. Mild dyspnea but no resp distress, some evidence of pum edema, will defer to renal to manage fluid balance with HD , currently asymptomatic  LOS (Days) 8 A FACE TO FACE EVALUATION WAS PERFORMED  Sherlynn Tourville E 07/18/2016, 8:19 AM

## 2016-07-18 NOTE — Progress Notes (Signed)
Occupational Therapy Weekly Progress Note  Patient Details  Name: [name] MRN: [id number] Date of Birth: 10/08/1955  Beginning of progress report period: July 11, 2016 End of progress report period: July 18, 2016  Today's Date: 07/18/2016 OT Individual Time: 1300-1400 OT Individual Time Calculation (min): 60 min    Patient has met 4 of 4 short term goals.  Pt making steady progress towards OT goals. She can fluctuate from mod- supervision assist for functional transfers and ADLs depending on fatigue and pain levels. Awaiting fistula placement on Monday (6/11) to determine functional implications and mobility in prep for planned d/c home next week. Pt remains very motivated despite health challenges and is eager to return to most independent level possible.   Patient continues to demonstrate the following deficits: muscle weakness, decreased cardiorespiratoy endurance and decreased sitting balance, decreased postural control, decreased balance strategies and difficulty maintaining precautions and therefore will continue to benefit from skilled OT intervention to enhance overall performance with BADL and Reduce care partner burden.  Patient progressing toward long term goals..  Continue plan of care. Toilet transfer goal updated during reporting period to supervision due to pt progress.   OT Short Term Goals Week 1:  OT Short Term Goal 1 (Week 1): Pt will complete clothing managment prior to toileting with supervision OT Short Term Goal 1 - Progress (Week 1): Met OT Short Term Goal 2 (Week 1): Pt will complete lower body dressing with MIN A OT Short Term Goal 2 - Progress (Week 1): Met OT Short Term Goal 3 (Week 1): Pt will maintain dynamic sitting balance for 5 min with set up OT Short Term Goal 3 - Progress (Week 1): Met OT Short Term Goal 4 (Week 1): Pt will consistantly complete slide board transfers with MOD A (or less) OT Short Term Goal 4 - Progress (Week 1): Met Week 2:  OT Short  Term Goal 1 (Week 2): STG=LTG due to LOS  Skilled Therapeutic Interventions/Progress Updates:    Pt seen for OT session focusing on functional transfers, mobility, and education. Pt in supine upon arrival, agreeable to tx session. Received pain medication prior to tx session and then received scheduled pain meds at beginning of session.  Throughout session, pt completed sliding board transfers at min- supervision level. She initially required assist for placement of board, however, last two transfers of session pt able to correctly place board and complete transfer with supervision only. She required min questioning cues for proper set-up of w/c prior to transfers throughout session. In therapy gym, transferred to supine on mat, required VCs and min A for technique supine> sitting EOM. Supine on mat, completed x10 hip flexion and hip ABduction on R/L with VCs for proper technique. Rest breaks required btwn exercises.  Education and demonstration provided regarding figure eight wrapping for residual R LE. Pt return demonstrated with min A. Provided with inspection mirror and educated regarding purpose and importance of skin inspection/ integrity. Pt returned to room at end of session, returned to supine with all needs in reach and RN present. RN made aware of small amount of drainage from R LE incision.   Therapy Documentation Precautions:  Precautions Precautions: Fall Restrictions Weight Bearing Restrictions: Yes RLE Weight Bearing: Non weight bearing LLE Weight Bearing: Non weight bearing Pain: Pain Assessment Pain Assessment: 0-10 Pain Score: 4  Faces Pain Scale: Hurts a little bit Pain Type: Phantom pain Pain Location: Leg Pain Orientation: Right Pain Descriptors / Indicators: Aching Pain Frequency: Constant Pain  Intervention(s): Medication (See eMAR), RN aware, increased activity, repositioned, distraction  See Function Navigator for Current Functional Status.   Therapy/Group:  Individual Therapy  Lewis, Baylen Dea C 07/18/2016, 7:09 AM

## 2016-07-18 NOTE — Progress Notes (Signed)
ANTICOAGULATION CONSULT NOTE - Follow Up Consult  Pharmacy Consult for Heparin (while INR is sub-therapeutic) Indication: Mechanical AVR  Allergies  Allergen Reactions  . Acetaminophen Other (See Comments)    Liver transplant recipient   . Codeine Itching  . Mirtazapine Other (See Comments)    hallucination    Patient Measurements: Height: 5\' 4"  (162.6 cm) Weight: 125 lb 10.6 oz (57 kg) IBW/kg (Calculated) : 54.7 Vital Signs: Temp: 98.6 F (37 C) (06/07 1530) Temp Source: Oral (06/07 1530) BP: 120/64 (06/07 2140) Pulse Rate: 78 (06/07 2140)  Labs:  Recent Labs  07/15/16 0508 07/16/16 0531 07/16/16 1553 07/17/16 0603 07/17/16 2200  HGB 7.8* 7.7* 7.6* 7.4*  --   HCT 22.8* 22.6* 23.0* 22.8*  --   PLT 154 178 212 230  --   LABPROT 22.1* 23.7*  --  23.0*  --   INR 1.90 2.07  --  2.00  --   HEPARINUNFRC  --   --   --   --  <0.10*  CREATININE  --   --  5.56* 3.10*  --     Estimated Creatinine Clearance: 16.5 mL/min (A) (by C-G formula based on SCr of 3.1 mg/dL (H)).   Assessment: Heparin while INR is sub-therapeutic (goal 2.5-3.5). Initial heparin level is sub-therapeutic. No issues per RN.   Goal of Therapy:  Heparin level 0.3-0.7 units/ml Monitor platelets by anticoagulation protocol: Yes   Plan:  -Inc heparin to 1250 units/hr -0800 HL   Narda Bonds 07/18/2016,12:07 AM

## 2016-07-18 NOTE — Progress Notes (Addendum)
ANTICOAGULATION CONSULT NOTE - Follow Up Consult  Pharmacy Consult for Heparin (while INR is sub-therapeutic) Indication: mechanical AVR  Allergies  Allergen Reactions  . Acetaminophen Other (See Comments)    Liver transplant recipient   . Codeine Itching  . Mirtazapine Other (See Comments)    hallucination    Patient Measurements: Height: 5\' 4"  (162.6 cm) Weight: 121 lb 0.5 oz (54.9 kg) IBW/kg (Calculated) : 54.7 Heparin Dosing Weight: 54.9 kg  Vital Signs: Temp: 99.3 F (37.4 C) (06/08 0725) Temp Source: Oral (06/08 0725) BP: 103/60 (06/08 0725) Pulse Rate: 81 (06/08 0725)  Labs:  Recent Labs  07/16/16 0531 07/16/16 1553 07/17/16 0603 07/17/16 2200 07/18/16 0742  HGB 7.7* 7.6* 7.4*  --  8.3*  HCT 22.6* 23.0* 22.8*  --  25.9*  PLT 178 212 230  --  PENDING  LABPROT 23.7*  --  23.0*  --   --   INR 2.07  --  2.00  --   --   HEPARINUNFRC  --   --   --  <0.10*  --   CREATININE  --  5.56* 3.10*  --   --     Estimated Creatinine Clearance: 16.5 mL/min (A) (by C-G formula based on SCr of 3.1 mg/dL (H)).   Medications:  Scheduled:  . aspirin EC  81 mg Oral Daily  . calcitRIOL  0.5 mcg Oral Q M,W,F-HD  . calcium acetate  667 mg Oral TID WC  . darbepoetin (ARANESP) injection - DIALYSIS  200 mcg Intravenous Q Mon-HD  . docusate sodium  100 mg Oral Daily  . feeding supplement  1 Container Oral TID BM  . gabapentin  300 mg Oral QHS  . levothyroxine  175 mcg Oral QAC breakfast  . metoCLOPramide  5 mg Oral BID AC  . metoprolol tartrate  12.5 mg Oral BID  . multivitamin  1 tablet Oral QHS  . polyethylene glycol  17 g Oral BID  . senna-docusate  1 tablet Oral BID  . tacrolimus  2 mg Oral BID   Infusions:  .  ceFAZolin (ANCEF) IV    . ferric gluconate (FERRLECIT/NULECIT) IV    . heparin 1,250 Units/hr (07/18/16 0010)    Assessment: 61 yo female with mechanical AVR is currently on subtherapeutic heparin.  Heparin level is 0.14.  Hgb and Plt are overall stable.   INR 1.67. No problem with infusion per RN  Goal of Therapy:  Heparin level 0.3-0.7 units/ml Monitor platelets by anticoagulation protocol: Yes   Plan:  - increase heparin to 1400 units/hr - 8hr heparin level  - f/u restart coumadin after HD access on 07/21/16  Beverlie Kurihara, Tsz-Yin 07/18/2016,8:31 AM

## 2016-07-19 ENCOUNTER — Inpatient Hospital Stay (HOSPITAL_COMMUNITY): Payer: Medicare Other

## 2016-07-19 DIAGNOSIS — Z952 Presence of prosthetic heart valve: Secondary | ICD-10-CM

## 2016-07-19 DIAGNOSIS — R0602 Shortness of breath: Secondary | ICD-10-CM

## 2016-07-19 DIAGNOSIS — D638 Anemia in other chronic diseases classified elsewhere: Secondary | ICD-10-CM

## 2016-07-19 DIAGNOSIS — D62 Acute posthemorrhagic anemia: Secondary | ICD-10-CM

## 2016-07-19 DIAGNOSIS — E1142 Type 2 diabetes mellitus with diabetic polyneuropathy: Secondary | ICD-10-CM

## 2016-07-19 LAB — CBC
HCT: 26 % — ABNORMAL LOW (ref 36.0–46.0)
Hemoglobin: 8.4 g/dL — ABNORMAL LOW (ref 12.0–15.0)
MCH: 30 pg (ref 26.0–34.0)
MCHC: 32.3 g/dL (ref 30.0–36.0)
MCV: 92.9 fL (ref 78.0–100.0)
PLATELETS: 294 10*3/uL (ref 150–400)
RBC: 2.8 MIL/uL — ABNORMAL LOW (ref 3.87–5.11)
RDW: 24.1 % — ABNORMAL HIGH (ref 11.5–15.5)
WBC: 11.7 10*3/uL — ABNORMAL HIGH (ref 4.0–10.5)

## 2016-07-19 LAB — PROTIME-INR
INR: 1.38
PROTHROMBIN TIME: 17.1 s — AB (ref 11.4–15.2)

## 2016-07-19 LAB — HEPARIN LEVEL (UNFRACTIONATED): HEPARIN UNFRACTIONATED: 0.64 [IU]/mL (ref 0.30–0.70)

## 2016-07-19 LAB — GLUCOSE, CAPILLARY
GLUCOSE-CAPILLARY: 112 mg/dL — AB (ref 65–99)
GLUCOSE-CAPILLARY: 156 mg/dL — AB (ref 65–99)
Glucose-Capillary: 164 mg/dL — ABNORMAL HIGH (ref 65–99)
Glucose-Capillary: 82 mg/dL (ref 65–99)

## 2016-07-19 MED ORDER — LIDOCAINE HCL (PF) 1 % IJ SOLN: 5.0000 mL | INTRAMUSCULAR | Status: DC | PRN

## 2016-07-19 MED ORDER — SODIUM CHLORIDE 0.9 % IV SOLN: 100.0000 mL | INTRAVENOUS | Status: DC | PRN

## 2016-07-19 MED ORDER — SODIUM CHLORIDE 0.9 % IV SOLN
62.5000 mg | INTRAVENOUS | Status: DC
  Filled 2016-07-19: qty 5

## 2016-07-19 MED ORDER — LIDOCAINE HCL (PF) 1 % IJ SOLN
5.0000 mL | INTRAMUSCULAR | Status: DC | PRN
  Filled 2016-07-19: qty 5

## 2016-07-19 MED ORDER — LIDOCAINE-PRILOCAINE 2.5-2.5 % EX CREA
1.0000 "application " | TOPICAL_CREAM | CUTANEOUS | Status: DC | PRN
  Filled 2016-07-19: qty 5

## 2016-07-19 MED ORDER — HEPARIN SODIUM (PORCINE) 1000 UNIT/ML DIALYSIS
1000.0000 [IU] | INTRAMUSCULAR | Status: DC | PRN
  Filled 2016-07-19: qty 1

## 2016-07-19 MED ORDER — PENTAFLUOROPROP-TETRAFLUOROETH EX AERO: 1.0000 "application " | INHALATION_SPRAY | CUTANEOUS | Status: DC | PRN

## 2016-07-19 MED ORDER — HEPARIN SODIUM (PORCINE) 1000 UNIT/ML DIALYSIS: 1000.0000 [IU] | INTRAMUSCULAR | Status: DC | PRN

## 2016-07-19 MED ORDER — ALTEPLASE 2 MG IJ SOLR: 2.0000 mg | Freq: Once | INTRAMUSCULAR | Status: DC | PRN

## 2016-07-19 MED ORDER — LIDOCAINE-PRILOCAINE 2.5-2.5 % EX CREA: 1.0000 "application " | TOPICAL_CREAM | CUTANEOUS | Status: DC | PRN

## 2016-07-19 NOTE — Progress Notes (Signed)
Subjective:   C/o SOB today, " need more fluid off"  Objective Vital signs in last 24 hours: Vitals:   07/18/16 2025 07/19/16 0505 07/19/16 0850 07/19/16 1103  BP: 124/76 120/77 129/60   Pulse: 92 86 80   Resp: (!) 22     Temp: 98.9 F (37.2 C) 98.7 F (37.1 C)    TempSrc: Oral Oral    SpO2:  90%  100%  Weight:  52.8 kg (116 lb 6.5 oz)    Height:       Weight change: 1.6 kg (3 lb 8.4 oz)  Physical Exam: General: alert , slightly dyspneic at rest Heart: RRR   Lungs: R base dec'd, L clear Abdomen: soft nt, nd Extremities:Bilat BKA , New on R clean dry bandage   Dialysis Acces-R IJ Perm cath   Dialysis:  [facility] MWF 3.5h  R IJ cath    50kg   Hep none  2/2 bath   -Calcitriol 0.5 mcg PO q HD -Mircera 50 mcg IV q 4 weeks   Background: 61 y.o.femalewith ESRD on HD, HTN, DM, CAD, chronic back pain, 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 Scot Dock. 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 Arteriogram no surgically correctable dx.     Assessment: 1. 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  Dyspnea - enlarging R effusion on CXR today w/ assoc vol overload, needs sig vol removal and lowering of dry wt post amputation 2. ESRD - MWF HD via [place] 3. Anemia - max ESA, monitor 4.Metabolic bone disease - Cont VDRA/Ca acetate binder  5.Severe protein malnutrition - Renal diet/vitamins/ supplements - on regular diet 6. S/p mechanical AVR and TDC -- for AVG LUE 6/11 7. H/o liver transplant - on Prograf 8. DM - per primary  9.   BP/volume - as above   P - extra HD today, get vol down, possible HD tomorrow   Labs: Basic Metabolic Panel:  Recent Labs Lab 07/14/16 1530 07/16/16 1553 07/17/16 0603 07/18/16 1630  NA 127* 128* 132* 132*  K 3.8 3.5 3.5 3.4*  CL 93* 93* 96* 96*  CO2 23 24 26 26   GLUCOSE 207* 288* 134* 262*  BUN 20 17 7 14    CREATININE 6.35* 5.56* 3.10* 4.61*  CALCIUM 8.9 9.0 8.6* 8.5*  PHOS 7.1* 6.5*  --  5.9*   Liver Function Tests:  Recent Labs Lab 07/13/16 0654 07/14/16 1530 07/16/16 1553 07/18/16 1630  AST 79*  --   --   --   ALT 21  --   --   --   ALKPHOS 352*  --   --   --   BILITOT 4.0*  --   --   --   PROT 6.4*  --   --   --   ALBUMIN 1.6* 1.7* 1.7* 1.6*   No results for input(s): LIPASE, AMYLASE in the last 168 hours. No results for input(s): AMMONIA in the last 168 hours. CBC:  Recent Labs Lab 07/16/16 1553 07/17/16 0603 07/18/16 0742 07/18/16 1630 07/19/16 0458  WBC 11.9* 11.9* 10.7* 10.9* 11.7*  HGB 7.6* 7.4* 8.3* 7.1* 8.4*  HCT 23.0* 22.8* 25.9* 22.1* 26.0*  MCV 88.8 89.4 92.2 91.3 92.9  PLT 212 230 282 259 294   Cardiac Enzymes: No results for input(s): CKTOTAL, CKMB, CKMBINDEX, TROPONINI in the last 168 hours. CBG:  Recent Labs Lab 07/18/16 0754 07/18/16 1203 07/18/16 2016 07/19/16 0749 07/19/16 1145  GLUCAP 127* 183* 112* 112* 164*    Studies/Results: Dg Chest 2 View  Result Date: 07/19/2016 CLINICAL DATA:  Shortness of breath. EXAM: CHEST  2 VIEW COMPARISON:  07/17/2016 and prior radiographs FINDINGS: Cardiomegaly, aortic valve replacement and right IJ central venous catheter with tip overlying the superior cavoatrial junction again noted. A moderate to large right pleural effusion appears slightly increased in size. A small left pleural effusion is again noted. Mild interstitial opacities are again identified likely representing mild interstitial edema. Bilateral lower lung atelectasis again noted. There is no evidence of pneumothorax. IMPRESSION: Moderate to large right pleural effusion which appears slightly increased since 07/17/2016. No other significant changes. Small left pleural effusion, probable mild interstitial pulmonary edema and bilateral lower lobe atelectasis again noted. Electronically Signed   By: Margarette Canada M.D.   On: 07/19/2016 10:30    Medications: . [START ON 07/21/2016] ferric gluconate (FERRLECIT/NULECIT) IV    . heparin 1,400 Units/hr (07/19/16 1103)   . aspirin EC  81 mg Oral Daily  . calcitRIOL  0.5 mcg Oral Q M,W,F-HD  . calcium acetate  667 mg Oral TID WC  . darbepoetin (ARANESP) injection - DIALYSIS  200 mcg Intravenous Q Mon-HD  . docusate sodium  100 mg Oral Daily  . feeding supplement  1 Container Oral TID BM  . gabapentin  300 mg Oral QHS  . levothyroxine  175 mcg Oral QAC breakfast  . metoCLOPramide  5 mg Oral BID AC  . metoprolol tartrate  12.5 mg Oral BID  . multivitamin  1 tablet Oral QHS  . polyethylene glycol  17 g Oral BID  . senna-docusate  1 tablet Oral BID  . tacrolimus  2 mg Oral BID

## 2016-07-19 NOTE — Progress Notes (Signed)
Subjective/Complaints: Pt seen laying in bed this AM.  She slept well overnight.  Later informed by nursing of increased WOB and disorientation.   ROS: Denies CP, SOB, N/V/D.   Objective: Vital Signs: Blood pressure 129/60, pulse 80, temperature 98.7 F (37.1 C), temperature source Oral, resp. rate (!) 22, height 5' 4"  (1.626 m), weight 52.8 kg (116 lb 6.5 oz), SpO2 90 %. Dg Chest 2 View  Result Date: 07/17/2016 CLINICAL DATA:  Shortness of breath. History of liver transplant, coronary artery disease, valvular heart disease, dialysis dependent renal failure. EXAM: CHEST  2 VIEW COMPARISON:  Chest x-ray of May 28, 2016 FINDINGS: The left lung is adequately inflated. On the right there is mild volume loss slightly more conspicuous today. A moderate size right pleural effusion is present and has increased slightly since the previous study. The heart is mildly enlarged. There is a prosthetic aortic valve. The sternal wires are intact. There is calcification in the wall of the aortic arch. The central pulmonary vascularity is prominent. The dialysis catheter tip projects at the cavoatrial junction. IMPRESSION: Slight interval increase in the size of the right pleural effusion such that it is moderate in size. A small left pleural effusion is stable. There is mild interstitial prominence bilaterally compatible with edema. Stable mild cardiomegaly with central pulmonary vascular congestion. Electronically Signed   By: David  Martinique M.D.   On: 07/17/2016 10:10   Results for orders placed or performed during the hospital encounter of 07/10/16 (from the past 72 hour(s))  Glucose, capillary     Status: Abnormal   Collection Time: 07/16/16 12:09 PM  Result Value Ref Range   Glucose-Capillary 103 (H) 65 - 99 mg/dL  CBC     Status: Abnormal   Collection Time: 07/16/16  3:53 PM  Result Value Ref Range   WBC 11.9 (H) 4.0 - 10.5 K/uL    Comment: WHITE COUNT CONFIRMED ON SMEAR   RBC 2.59 (L) 3.87 - 5.11  MIL/uL   Hemoglobin 7.6 (L) 12.0 - 15.0 g/dL   HCT 23.0 (L) 36.0 - 46.0 %   MCV 88.8 78.0 - 100.0 fL   MCH 29.3 26.0 - 34.0 pg   MCHC 33.0 30.0 - 36.0 g/dL   RDW 22.0 (H) 11.5 - 15.5 %   Platelets 212 150 - 400 K/uL    Comment: PLATELET COUNT CONFIRMED BY SMEAR  Renal function panel     Status: Abnormal   Collection Time: 07/16/16  3:53 PM  Result Value Ref Range   Sodium 128 (L) 135 - 145 mmol/L   Potassium 3.5 3.5 - 5.1 mmol/L   Chloride 93 (L) 101 - 111 mmol/L   CO2 24 22 - 32 mmol/L   Glucose, Bld 288 (H) 65 - 99 mg/dL   BUN 17 6 - 20 mg/dL   Creatinine, Ser 5.56 (H) 0.44 - 1.00 mg/dL   Calcium 9.0 8.9 - 10.3 mg/dL   Phosphorus 6.5 (H) 2.5 - 4.6 mg/dL   Albumin 1.7 (L) 3.5 - 5.0 g/dL   GFR calc non Af Amer 7 (L) >60 mL/min   GFR calc Af Amer 9 (L) >60 mL/min    Comment: (NOTE) The eGFR has been calculated using the CKD EPI equation. This calculation has not been validated in all clinical situations. eGFR's persistently <60 mL/min signify possible Chronic Kidney Disease.    Anion gap 11 5 - 15  Glucose, capillary     Status: None   Collection Time: 07/16/16  8:01 PM  Result Value Ref Range   Glucose-Capillary 87 65 - 99 mg/dL  Glucose, capillary     Status: Abnormal   Collection Time: 07/16/16  9:35 PM  Result Value Ref Range   Glucose-Capillary 128 (H) 65 - 99 mg/dL  Protime-INR     Status: Abnormal   Collection Time: 07/17/16  6:03 AM  Result Value Ref Range   Prothrombin Time 23.0 (H) 11.4 - 15.2 seconds   INR 2.00   CBC     Status: Abnormal   Collection Time: 07/17/16  6:03 AM  Result Value Ref Range   WBC 11.9 (H) 4.0 - 10.5 K/uL    Comment: WHITE COUNT CONFIRMED ON SMEAR   RBC 2.55 (L) 3.87 - 5.11 MIL/uL   Hemoglobin 7.4 (L) 12.0 - 15.0 g/dL   HCT 22.8 (L) 36.0 - 46.0 %   MCV 89.4 78.0 - 100.0 fL   MCH 29.0 26.0 - 34.0 pg   MCHC 32.5 30.0 - 36.0 g/dL   RDW 22.4 (H) 11.5 - 15.5 %   Platelets 230 150 - 400 K/uL    Comment: PLATELET COUNT CONFIRMED BY  SMEAR  Basic metabolic panel     Status: Abnormal   Collection Time: 07/17/16  6:03 AM  Result Value Ref Range   Sodium 132 (L) 135 - 145 mmol/L   Potassium 3.5 3.5 - 5.1 mmol/L   Chloride 96 (L) 101 - 111 mmol/L   CO2 26 22 - 32 mmol/L   Glucose, Bld 134 (H) 65 - 99 mg/dL   BUN 7 6 - 20 mg/dL   Creatinine, Ser 3.10 (H) 0.44 - 1.00 mg/dL    Comment: DELTA CHECK NOTED   Calcium 8.6 (L) 8.9 - 10.3 mg/dL   GFR calc non Af Amer 15 (L) >60 mL/min   GFR calc Af Amer 18 (L) >60 mL/min    Comment: (NOTE) The eGFR has been calculated using the CKD EPI equation. This calculation has not been validated in all clinical situations. eGFR's persistently <60 mL/min signify possible Chronic Kidney Disease.    Anion gap 10 5 - 15  Glucose, capillary     Status: Abnormal   Collection Time: 07/17/16  7:18 AM  Result Value Ref Range   Glucose-Capillary 109 (H) 65 - 99 mg/dL  Glucose, capillary     Status: Abnormal   Collection Time: 07/17/16 11:46 AM  Result Value Ref Range   Glucose-Capillary 164 (H) 65 - 99 mg/dL  Glucose, capillary     Status: Abnormal   Collection Time: 07/17/16  4:32 PM  Result Value Ref Range   Glucose-Capillary 100 (H) 65 - 99 mg/dL  Heparin level (unfractionated)     Status: Abnormal   Collection Time: 07/17/16 10:00 PM  Result Value Ref Range   Heparin Unfractionated <0.10 (L) 0.30 - 0.70 IU/mL    Comment:        IF HEPARIN RESULTS ARE BELOW EXPECTED VALUES, AND PATIENT DOSAGE HAS BEEN CONFIRMED, SUGGEST FOLLOW UP TESTING OF ANTITHROMBIN III LEVELS. REPEATED TO VERIFY   Protime-INR     Status: Abnormal   Collection Time: 07/18/16  7:42 AM  Result Value Ref Range   Prothrombin Time 19.9 (H) 11.4 - 15.2 seconds   INR 1.67   CBC     Status: Abnormal   Collection Time: 07/18/16  7:42 AM  Result Value Ref Range   WBC 10.7 (H) 4.0 - 10.5 K/uL    Comment: WHITE COUNT CONFIRMED ON SMEAR   RBC 2.81 (  L) 3.87 - 5.11 MIL/uL   Hemoglobin 8.3 (L) 12.0 - 15.0 g/dL    HCT 25.9 (L) 36.0 - 46.0 %   MCV 92.2 78.0 - 100.0 fL   MCH 29.5 26.0 - 34.0 pg   MCHC 32.0 30.0 - 36.0 g/dL   RDW 23.8 (H) 11.5 - 15.5 %   Platelets 282 150 - 400 K/uL    Comment: PLATELET COUNT CONFIRMED BY SMEAR  Heparin level (unfractionated)     Status: Abnormal   Collection Time: 07/18/16  7:42 AM  Result Value Ref Range   Heparin Unfractionated 0.14 (L) 0.30 - 0.70 IU/mL    Comment:        IF HEPARIN RESULTS ARE BELOW EXPECTED VALUES, AND PATIENT DOSAGE HAS BEEN CONFIRMED, SUGGEST FOLLOW UP TESTING OF ANTITHROMBIN III LEVELS.   Glucose, capillary     Status: Abnormal   Collection Time: 07/18/16  7:54 AM  Result Value Ref Range   Glucose-Capillary 127 (H) 65 - 99 mg/dL  Glucose, capillary     Status: Abnormal   Collection Time: 07/18/16 12:03 PM  Result Value Ref Range   Glucose-Capillary 183 (H) 65 - 99 mg/dL  CBC     Status: Abnormal   Collection Time: 07/18/16  4:30 PM  Result Value Ref Range   WBC 10.9 (H) 4.0 - 10.5 K/uL   RBC 2.42 (L) 3.87 - 5.11 MIL/uL   Hemoglobin 7.1 (L) 12.0 - 15.0 g/dL   HCT 22.1 (L) 36.0 - 46.0 %   MCV 91.3 78.0 - 100.0 fL   MCH 29.3 26.0 - 34.0 pg   MCHC 32.1 30.0 - 36.0 g/dL   RDW 23.4 (H) 11.5 - 15.5 %   Platelets 259 150 - 400 K/uL    Comment: PLATELET COUNT CONFIRMED BY SMEAR  Renal function panel     Status: Abnormal   Collection Time: 07/18/16  4:30 PM  Result Value Ref Range   Sodium 132 (L) 135 - 145 mmol/L   Potassium 3.4 (L) 3.5 - 5.1 mmol/L   Chloride 96 (L) 101 - 111 mmol/L   CO2 26 22 - 32 mmol/L   Glucose, Bld 262 (H) 65 - 99 mg/dL   BUN 14 6 - 20 mg/dL   Creatinine, Ser 4.61 (H) 0.44 - 1.00 mg/dL   Calcium 8.5 (L) 8.9 - 10.3 mg/dL   Phosphorus 5.9 (H) 2.5 - 4.6 mg/dL   Albumin 1.6 (L) 3.5 - 5.0 g/dL   GFR calc non Af Amer 9 (L) >60 mL/min   GFR calc Af Amer 11 (L) >60 mL/min    Comment: (NOTE) The eGFR has been calculated using the CKD EPI equation. This calculation has not been validated in all clinical  situations. eGFR's persistently <60 mL/min signify possible Chronic Kidney Disease.    Anion gap 10 5 - 15  Heparin level (unfractionated)     Status: None   Collection Time: 07/18/16  7:30 PM  Result Value Ref Range   Heparin Unfractionated 0.65 0.30 - 0.70 IU/mL    Comment:        IF HEPARIN RESULTS ARE BELOW EXPECTED VALUES, AND PATIENT DOSAGE HAS BEEN CONFIRMED, SUGGEST FOLLOW UP TESTING OF ANTITHROMBIN III LEVELS.   Glucose, capillary     Status: Abnormal   Collection Time: 07/18/16  8:16 PM  Result Value Ref Range   Glucose-Capillary 112 (H) 65 - 99 mg/dL  Protime-INR     Status: Abnormal   Collection Time: 07/19/16  4:58 AM  Result  Value Ref Range   Prothrombin Time 17.1 (H) 11.4 - 15.2 seconds   INR 1.38   CBC     Status: Abnormal   Collection Time: 07/19/16  4:58 AM  Result Value Ref Range   WBC 11.7 (H) 4.0 - 10.5 K/uL   RBC 2.80 (L) 3.87 - 5.11 MIL/uL   Hemoglobin 8.4 (L) 12.0 - 15.0 g/dL   HCT 26.0 (L) 36.0 - 46.0 %   MCV 92.9 78.0 - 100.0 fL   MCH 30.0 26.0 - 34.0 pg   MCHC 32.3 30.0 - 36.0 g/dL   RDW 24.1 (H) 11.5 - 15.5 %   Platelets 294 150 - 400 K/uL  Heparin level (unfractionated)     Status: None   Collection Time: 07/19/16  4:58 AM  Result Value Ref Range   Heparin Unfractionated 0.64 0.30 - 0.70 IU/mL    Comment:        IF HEPARIN RESULTS ARE BELOW EXPECTED VALUES, AND PATIENT DOSAGE HAS BEEN CONFIRMED, SUGGEST FOLLOW UP TESTING OF ANTITHROMBIN III LEVELS.   Glucose, capillary     Status: Abnormal   Collection Time: 07/19/16  7:49 AM  Result Value Ref Range   Glucose-Capillary 112 (H) 65 - 99 mg/dL   Comment 1 Notify RN      HEENT: normocephalic and atraumatic.  Oral: poor dentition Cardio: RRR. No JVD. Resp:CTA B. Unlabored. GI: BS positive, nondistended Musc:  Edema R BKA with tenderness. Healed left BKA Neuro: Alert/Oriented Motor 4+/5 in BUE  LLE: HF, KE 4/5 RLE: HF 3/5, KE 3-/5 (pain inhibition) Musc/Skel: no hypersensitivity  to touch on R distal stump Gen NAD. Well developed Skin:   R BKA incision with dressing c/d/i  Assessment/Plan: 1. Functional deficits secondary to New right and recent left BKA which require 3+ hours per day of interdisciplinary therapy in a comprehensive inpatient rehab setting. Physiatrist is providing close team supervision and 24 hour management of active medical problems listed below. Physiatrist and rehab team continue to assess barriers to discharge/monitor patient progress toward functional and medical goals. FIM: Function - Bathing Position: Sitting EOB Body parts bathed by patient: Right arm, Left arm, Chest, Abdomen, Front perineal area, Buttocks, Right upper leg, Left upper leg Body parts bathed by helper: Back Bathing not applicable: Left lower leg, Right lower leg Assist Level: Supervision or verbal cues  Function- Upper Body Dressing/Undressing What is the patient wearing?: Pull over shirt/dress Pull over shirt/dress - Perfomed by patient: Thread/unthread right sleeve, Thread/unthread left sleeve, Put head through opening, Pull shirt over trunk Assist Level: Set up Set up : To obtain clothing/put away Function - Lower Body Dressing/Undressing What is the patient wearing?: Underwear, Pants Position: Bed Underwear - Performed by patient: Thread/unthread right underwear leg, Thread/unthread left underwear leg, Pull underwear up/down Pants- Performed by patient: Thread/unthread right pants leg, Thread/unthread left pants leg Pants- Performed by helper: Pull pants up/down Assist for lower body dressing: Touching or steadying assistance (Pt > 75%)  Function - Toileting Toileting activity did not occur: No continent bowel/bladder event Toileting steps completed by patient: Adjust clothing prior to toileting, Performs perineal hygiene, Adjust clothing after toileting Toileting steps completed by helper: Adjust clothing prior to toileting, Adjust clothing after  toileting Toileting Assistive Devices: Grab bar or rail Assist level: Supervision or verbal cues  Function - Air cabin crew transfer activity did not occur:  (not attempted) Toilet transfer assistive device: Bedside commode Assist level to toilet: Maximal assist (Pt 25 - 49%/lift and lower) Assist  level from toilet: Moderate assist (Pt 50 - 74%/lift or lower) Assist level to bedside commode (at bedside): Moderate assist (Pt 50 - 74%/lift or lower) Assist level from bedside commode (at bedside): Supervision or verbal cues  Function - Chair/bed transfer Chair/bed transfer activity did not occur: N/A Chair/bed transfer method: Lateral scoot Chair/bed transfer assist level: Touching or steadying assistance (Pt > 75%) Chair/bed transfer assistive device: Sliding board, Armrests  Function - Locomotion: Wheelchair Type: Manual Max wheelchair distance: 173f  Assist Level: Supervision or verbal cues Assist Level: Supervision or verbal cues Assist Level: Supervision or verbal cues Turns around,maneuvers to table,bed, and toilet,negotiates 3% grade,maneuvers on rugs and over doorsills: No Function - Locomotion: Ambulation Ambulation activity did not occur: Safety/medical concerns Walk 10 feet activity did not occur: Safety/medical concerns Walk 50 feet with 2 turns activity did not occur: Safety/medical concerns Walk 150 feet activity did not occur: Safety/medical concerns Walk 10 feet on uneven surfaces activity did not occur: Safety/medical concerns  Function - Comprehension Comprehension: Auditory Comprehension assist level: Understands complex 90% of the time/cues 10% of the time  Function - Expression Expression: Verbal Expression assist level: Expresses complex 90% of the time/cues < 10% of the time  Function - Social Interaction Social Interaction assist level: Interacts appropriately 90% of the time - Needs monitoring or encouragement for participation or  interaction.  Function - Problem Solving Problem solving assist level: Solves basic 90% of the time/requires cueing < 10% of the time  Function - Memory Memory assist level: Recognizes or recalls 90% of the time/requires cueing < 10% of the time Patient normally able to recall (first 3 days only): Current season, That he or she is in a hospital, Staff names and faces    Medical Problem List and Plan:  1. 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   Cont CIR   Pt needs to use KI for RLE.   Need access for HD, on IV heparin pending AV graft for Monday 6/11 2. DVT Prophylaxis/Anticoagulation: Chronic Coumadin therapy for history of mechanical aortic valve  3. Pain Management: Gabapentin/TENS for phantom pain,  4. Mood: Provide emotional support  5. Neuropsych: This patient is capable of making decisions on her own behalf.  6. Skin/Wound Care: Dry dressing/ACE to right BK . Drainage decreasing. Local care to left BKA---almost healed  7. Fluids/Electrolytes/Nutrition: Routine I&Os  8. ESRD. Continue dialysis as directed. HD to occur at the end of the day to allow for participation in rehab activities per Nephro 9. Leukocytosis.   WBCs 11.7 on 6/9  Cont to monitor 10. Acute on chronic anemia. Continue Aranesp.   Hb 8.4 on 6/9  Cont to monitor 11. Hypertension. Lopressor 25 mg twice a day. Monitor with increased mobility   Controlled 6/9 12. CAD/AVR.   Coumadin currently on hold for procedure 14. Diabetes mellitus of peripheral neuropathy. Hemoglobin A1c 5.7.Bouts of hypoglycemia with sliding scale discontinued  contol is fair, became hypoglycemic on glimipride  Relatively controlled 6/9 15. Hypothyroidism. Synthroid  17. SOB with ?confusion  Stat CXR ordered  Frequent checks, will consider further workup if symptoms due not improve  LOS (Days) 9 A FACE TO FACE EVALUATION WAS PERFORMED  Ankit ALorie Phenix6/10/2016,  9:25 AM

## 2016-07-19 NOTE — Progress Notes (Signed)
ANTICOAGULATION CONSULT NOTE - Follow Up Consult  Pharmacy Consult for Heparin Indication: mechanical AVR  Allergies  Allergen Reactions  . Acetaminophen Other (See Comments)    Liver transplant recipient   . Codeine Itching  . Mirtazapine Other (See Comments)    hallucination    Patient Measurements: Height: 5\' 4"  (162.6 cm) Weight: 116 lb 6.5 oz (52.8 kg) IBW/kg (Calculated) : 54.7 Heparin Dosing Weight: 54.9 kg  Vital Signs: Temp: 98.7 F (37.1 C) (06/09 0505) Temp Source: Oral (06/09 0505) BP: 120/77 (06/09 0505) Pulse Rate: 86 (06/09 0505)  Labs:  Recent Labs  07/16/16 1553 07/17/16 0603  07/18/16 0742 07/18/16 1630 07/18/16 1930 07/19/16 0458  HGB 7.6* 7.4*  --  8.3* 7.1*  --  8.4*  HCT 23.0* 22.8*  --  25.9* 22.1*  --  26.0*  PLT 212 230  --  282 259  --  294  LABPROT  --  23.0*  --  19.9*  --   --  17.1*  INR  --  2.00  --  1.67  --   --  1.38  HEPARINUNFRC  --   --   < > 0.14*  --  0.65 0.64  CREATININE 5.56* 3.10*  --   --  4.61*  --   --   < > = values in this interval not displayed.  Estimated Creatinine Clearance: 10.7 mL/min (A) (by C-G formula based on SCr of 4.61 mg/dL (H)).   Medications:  Infusions:  . ferric gluconate (FERRLECIT/NULECIT) IV    . heparin 1,400 Units/hr (07/18/16 1357)    Assessment: 12 yoF with mechanical AVR on warfarin PTA which is being held for HD access procedure planned on 6/11. Pt currently bridged on IV heparin with heparin level therapeutic x2. Hgb low but stable, no S/Sx bleeding documented.  Goal of Therapy:  Heparin level 0.3-0.7 units/ml Monitor platelets by anticoagulation protocol: Yes   Plan:  -Continue heparin drip at 1400 units/hr -Daily heparin level, CBC, S/Sx bleeding -F/U resuming warfarin post-procedure on 6/11  Arrie Senate, PharmD PGY-1 Pharmacy Resident Pager: [phone] 07/19/2016

## 2016-07-19 NOTE — Progress Notes (Signed)
Occupational Therapy Note  Patient Details  Name: [name] MRN: [id number] Date of Birth: 1955-12-05  Today's Date: 07/19/2016 OT Missed Time: 18 Minutes Missed Time Reason: Patient fatigue;Patient ill (comment)  Pt resting in bed upon arrival.  RN notified OT that pt went down for chest xray earlier 2/2 difficulty breathing.  RN stated pt was feeling better now but fatigued.  Pt stated she was still not feeling well and needed to rest.  Pt missed 60 mins skilled OT services.   Leotis Shames [facility] 07/19/2016, 11:35 AM

## 2016-07-20 ENCOUNTER — Inpatient Hospital Stay (HOSPITAL_COMMUNITY): Payer: Medicare Other

## 2016-07-20 LAB — CBC
HEMATOCRIT: 22.5 % — AB (ref 36.0–46.0)
HEMOGLOBIN: 7 g/dL — AB (ref 12.0–15.0)
MCH: 28.8 pg (ref 26.0–34.0)
MCHC: 31.1 g/dL (ref 30.0–36.0)
MCV: 92.6 fL (ref 78.0–100.0)
Platelets: 284 10*3/uL (ref 150–400)
RBC: 2.43 MIL/uL — ABNORMAL LOW (ref 3.87–5.11)
RDW: 23.6 % — ABNORMAL HIGH (ref 11.5–15.5)
WBC: 10.7 10*3/uL — AB (ref 4.0–10.5)

## 2016-07-20 LAB — RENAL FUNCTION PANEL
ANION GAP: 9 (ref 5–15)
Albumin: 1.7 g/dL — ABNORMAL LOW (ref 3.5–5.0)
BUN: 5 mg/dL — AB (ref 6–20)
CALCIUM: 8.8 mg/dL — AB (ref 8.9–10.3)
CO2: 26 mmol/L (ref 22–32)
CREATININE: 2.86 mg/dL — AB (ref 0.44–1.00)
Chloride: 100 mmol/L — ABNORMAL LOW (ref 101–111)
GFR calc Af Amer: 19 mL/min — ABNORMAL LOW (ref >60)
GFR calc non Af Amer: 17 mL/min — ABNORMAL LOW (ref >60)
GLUCOSE: 163 mg/dL — AB (ref 65–99)
Phosphorus: 3.9 mg/dL (ref 2.5–4.6)
Potassium: 3.5 mmol/L (ref 3.5–5.1)
SODIUM: 135 mmol/L (ref 135–145)

## 2016-07-20 LAB — PREPARE RBC (CROSSMATCH)

## 2016-07-20 LAB — PROTIME-INR
INR: 1.71
Prothrombin Time: 20.3 seconds — ABNORMAL HIGH (ref 11.4–15.2)

## 2016-07-20 LAB — HEPARIN LEVEL (UNFRACTIONATED)
HEPARIN UNFRACTIONATED: 0.53 [IU]/mL (ref 0.30–0.70)
Heparin Unfractionated: >2.2 IU/mL — ABNORMAL HIGH (ref 0.30–0.70)
Heparin Unfractionated: 0.57 IU/mL (ref 0.30–0.70)

## 2016-07-20 LAB — GLUCOSE, CAPILLARY
GLUCOSE-CAPILLARY: 106 mg/dL — AB (ref 65–99)
GLUCOSE-CAPILLARY: 145 mg/dL — AB (ref 65–99)
GLUCOSE-CAPILLARY: 188 mg/dL — AB (ref 65–99)
Glucose-Capillary: 113 mg/dL — ABNORMAL HIGH (ref 65–99)

## 2016-07-20 MED ORDER — SODIUM CHLORIDE 0.9 % IV SOLN
Freq: Once | INTRAVENOUS | Status: AC
  Administered 2016-07-21: 07:00:00 via INTRAVENOUS

## 2016-07-20 MED ORDER — HEPARIN (PORCINE) IN NACL 100-0.45 UNIT/ML-% IJ SOLN
1200.0000 [IU]/h | INTRAMUSCULAR | Status: DC
  Administered 2016-07-20 (×2): 1200 [IU]/h via INTRAVENOUS
  Filled 2016-07-20: qty 250

## 2016-07-20 NOTE — Progress Notes (Signed)
Occupational Therapy Session Note  Patient Details  Name: [name] MRN: [id number] Date of Birth: 02/20/1955  Today's Date: 07/20/2016 OT Individual Time: 1120-1200 OT Individual Time Calculation (min): 40 min     Skilled Therapeutic Interventions/Progress Updates:    1:1. Pt requesting to bathe and no pain reported. Pt sits EOB to bathe with supervision/set up washing all body parts except back with increased time to lean laterally to wash buttocks. Pt dons pull over shirt with set up and underwear/pants with supervision and Vc to pull pants up in back while leaning laterally. Pt completes slide board transfer with A to remove board, but pt able to place board this date with VC. Pt brushes teeth seated in w/c. Exited session with pt seated in w/c, call light in reach and all needs met.  Therapy Documentation Precautions:  Precautions Precautions: Fall Restrictions Weight Bearing Restrictions: Yes RLE Weight Bearing: Non weight bearing LLE Weight Bearing: Non weight bearing  See Function Navigator for Current Functional Status.   Therapy/Group: Individual Therapy  Tonny Branch 07/20/2016, 12:03 PM

## 2016-07-20 NOTE — Plan of Care (Signed)
Problem: Consults Goal: Skin Care Protocol Initiated - if Braden Score 18 or less If consults are not indicated, leave blank or document N/A  Outcome: Not Progressing [place] to sacrum with pink foam dry and intact  Problem: RH SKIN INTEGRITY Goal: RH STG SKIN FREE OF INFECTION/BREAKDOWN Skin to remain free from infection and breakdown while on rehab with min assist from staff.  Outcome: Not Progressing No signs and symptoms of infection noted  Problem: RH SAFETY Goal: RH STG ADHERE TO SAFETY PRECAUTIONS W/ASSISTANCE/DEVICE STG Adhere to Safety Precautions With moderate Assistance and appropriate assistive Device.   Outcome: Progressing Safety precautions maintained  Problem: RH PAIN MANAGEMENT Goal: RH STG PAIN MANAGED AT OR BELOW [name] PAIN GOAL <4 on a 0-10 pain scale  Outcome: Not Progressing Complained of neuropathic pain level of 10 to right stump incision

## 2016-07-20 NOTE — Progress Notes (Signed)
[facility] for Heparin Indication: mechanical AVR  Allergies  Allergen Reactions  . Acetaminophen Other (See Comments)    Liver transplant recipient   . Codeine Itching  . Mirtazapine Other (See Comments)    hallucination    Patient Measurements: Height: 5\' 4"  (162.6 cm) Weight: 104 lb 11.5 oz (47.5 kg) IBW/kg (Calculated) : 54.7 Heparin Dosing Weight: 54.9 kg  Vital Signs: Temp: 98.5 F (36.9 C) (06/10 1905) Temp Source: Oral (06/10 1905) BP: 129/73 (06/10 1940) Pulse Rate: 89 (06/10 1940)  Labs:  Recent Labs  07/18/16 0742 07/18/16 1630  07/19/16 0458 07/20/16 0507 07/20/16 0721 07/20/16 1441 07/20/16 2206  HGB 8.3* 7.1*  --  8.4* 7.0*  --   --   --   HCT 25.9* 22.1*  --  26.0* 22.5*  --   --   --   PLT 282 259  --  294 284  --   --   --   LABPROT 19.9*  --   --  17.1* 20.3*  --   --   --   INR 1.67  --   --  1.38 1.71  --   --   --   HEPARINUNFRC 0.14*  --   < > 0.64 >2.20* 0.53  --  0.57  CREATININE  --  4.61*  --   --   --   --  2.86*  --   < > = values in this interval not displayed.  Estimated Creatinine Clearance: 15.5 mL/min (A) (by C-G formula based on SCr of 2.86 mg/dL (H)).   Medications:  Infusions:  . sodium chloride    . [START ON 07/21/2016] ferric gluconate (FERRLECIT/NULECIT) IV    . heparin 1,200 Units/hr (07/20/16 2115)    Assessment: [name] with mechanical AVR for heparin Goal of Therapy:  Heparin level 0.3-0.7 units/ml Monitor platelets by anticoagulation protocol: Yes   Plan:  Continue Heparin at current rate  Phillis Knack, PharmD, BCPS  07/20/2016

## 2016-07-20 NOTE — Progress Notes (Addendum)
Subjective:   C/o SOB today, "need more fluid off"  Objective Vital signs in last 24 hours: Vitals:   07/19/16 1649 07/19/16 1700 07/19/16 2156 07/20/16 0442  BP: 120/66 (!) 98/55 (!) 110/49 118/62  Pulse: 78  (!) 57 93  Resp: 18 18  18   Temp: 98 F (36.7 C)   99.3 F (37.4 C)  TempSrc: Oral   Oral  SpO2:      Weight: 49.3 kg (108 lb 11 oz)     Height:       Weight change: -2.8 kg (-6 lb 2.8 oz)  Physical Exam: General: alert , slightly dyspneic at rest Heart: RRR   Lungs: R base dec'd, L clear Abdomen: soft nt, nd Extremities:Bilat BKA , New on R clean dry bandage   Dialysis Acces-R IJ Perm cath   Dialysis:  [facility] MWF 3.5h  R IJ cath    50kg   Hep none  2/2 bath   -Calcitriol 0.5 mcg PO q HD -Mircera 50 mcg IV q 4 weeks   Background: 61 y.o.femalewith ESRD on HD, HTN, DM, CAD, chronic back pain, 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 Scot Dock. 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 Arteriogram no surgically correctable dx.     Assessment: 1. 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  Dyspnea / pleural effusions - due to vol overload, better after HD yest. Plan HD again today  3.  ESRD - usual MWF HD via [place]. For new L arm AVG Monday 6/11.  3. Anemia - max ESA, Hb 7, will need blood today 4.Metabolic bone disease - Cont VDRA/Ca acetate binder  5.Severe protein malnutrition - Renal diet/vitamins/ supplements - on regular diet 6. S/p mechanical AVR on coumadin 7. H/o liver transplant - on Prograf 8. DM - per primary  9.   HTN - on low dose MTP only, stable 10. Volume - is at dry wt now but needs further lowering due to amputation   P - HD again today, give 2u prbc and UF 2-3 L, get new dry wt ~ 46 kg   [name] MD [name] pgr [phone]   07/20/2016, 11:13 AM       Labs: Basic Metabolic  Panel:  Recent Labs Lab 07/14/16 1530 07/16/16 1553 07/17/16 0603 07/18/16 1630  NA 127* 128* 132* 132*  K 3.8 3.5 3.5 3.4*  CL 93* 93* 96* 96*  CO2 23 24 26 26   GLUCOSE 207* 288* 134* 262*  BUN 20 17 7 14   CREATININE 6.35* 5.56* 3.10* 4.61*  CALCIUM 8.9 9.0 8.6* 8.5*  PHOS 7.1* 6.5*  --  5.9*   Liver Function Tests:  Recent Labs Lab 07/14/16 1530 07/16/16 1553 07/18/16 1630  ALBUMIN 1.7* 1.7* 1.6*   No results for input(s): LIPASE, AMYLASE in the last 168 hours. No results for input(s): AMMONIA in the last 168 hours. CBC:  Recent Labs Lab 07/17/16 0603 07/18/16 0742 07/18/16 1630 07/19/16 0458 07/20/16 0507  WBC 11.9* 10.7* 10.9* 11.7* 10.7*  HGB 7.4* 8.3* 7.1* 8.4* 7.0*  HCT 22.8* 25.9* 22.1* 26.0* 22.5*  MCV 89.4 92.2 91.3 92.9 92.6  PLT 230 282 259 294 284   Cardiac Enzymes: No results for input(s): CKTOTAL, CKMB, CKMBINDEX, TROPONINI in the last 168 hours. CBG:  Recent Labs Lab 07/19/16 0749 07/19/16 1145 07/19/16 1805 07/19/16  2132 07/20/16 0645  GLUCAP 112* 164* 82 156* 106*    Studies/Results: Dg Chest 2 View  Result Date: 07/19/2016 CLINICAL DATA:  Shortness of breath. EXAM: CHEST  2 VIEW COMPARISON:  07/17/2016 and prior radiographs FINDINGS: Cardiomegaly, aortic valve replacement and right IJ central venous catheter with tip overlying the superior cavoatrial junction again noted. A moderate to large right pleural effusion appears slightly increased in size. A small left pleural effusion is again noted. Mild interstitial opacities are again identified likely representing mild interstitial edema. Bilateral lower lung atelectasis again noted. There is no evidence of pneumothorax. IMPRESSION: Moderate to large right pleural effusion which appears slightly increased since 07/17/2016. No other significant changes. Small left pleural effusion, probable mild interstitial pulmonary edema and bilateral lower lobe atelectasis again noted. Electronically  Signed   By: Margarette Canada M.D.   On: 07/19/2016 10:30   Medications: . sodium chloride    . sodium chloride    . sodium chloride    . sodium chloride    . [START ON 07/21/2016] ferric gluconate (FERRLECIT/NULECIT) IV    . heparin 1,200 Units/hr (07/20/16 0901)   . aspirin EC  81 mg Oral Daily  . calcitRIOL  0.5 mcg Oral Q M,W,F-HD  . calcium acetate  667 mg Oral TID WC  . darbepoetin (ARANESP) injection - DIALYSIS  200 mcg Intravenous Q Mon-HD  . docusate sodium  100 mg Oral Daily  . feeding supplement  1 Container Oral TID BM  . gabapentin  300 mg Oral QHS  . levothyroxine  175 mcg Oral QAC breakfast  . metoCLOPramide  5 mg Oral BID AC  . metoprolol tartrate  12.5 mg Oral BID  . multivitamin  1 tablet Oral QHS  . polyethylene glycol  17 g Oral BID  . senna-docusate  1 tablet Oral BID  . tacrolimus  2 mg Oral BID

## 2016-07-20 NOTE — Progress Notes (Addendum)
ANTICOAGULATION CONSULT NOTE - Follow Up Consult  Pharmacy Consult for Heparin Indication: mechanical AVR  Allergies  Allergen Reactions  . Acetaminophen Other (See Comments)    Liver transplant recipient   . Codeine Itching  . Mirtazapine Other (See Comments)    hallucination    Patient Measurements: Height: 5\' 4"  (162.6 cm) Weight: 108 lb 11 oz (49.3 kg) IBW/kg (Calculated) : 54.7 Heparin Dosing Weight: 54.9 kg  Vital Signs: Temp: 99.3 F (37.4 C) (06/10 0442) Temp Source: Oral (06/10 0442) BP: 118/62 (06/10 0442) Pulse Rate: 93 (06/10 0442)  Labs:  Recent Labs  07/18/16 0742 07/18/16 1630  07/19/16 0458 07/20/16 0507 07/20/16 0721  HGB 8.3* 7.1*  --  8.4* 7.0*  --   HCT 25.9* 22.1*  --  26.0* 22.5*  --   PLT 282 259  --  294 284  --   LABPROT 19.9*  --   --  17.1* 20.3*  --   INR 1.67  --   --  1.38 1.71  --   HEPARINUNFRC 0.14*  --   < > 0.64 >2.20* 0.53  CREATININE  --  4.61*  --   --   --   --   < > = values in this interval not displayed.  Estimated Creatinine Clearance: 10 mL/min (A) (by C-G formula based on SCr of 4.61 mg/dL (H)).   Medications:  Infusions:  . sodium chloride    . sodium chloride    . sodium chloride    . sodium chloride    . [START ON 07/21/2016] ferric gluconate (FERRLECIT/NULECIT) IV      Assessment: 51 yoF with mechanical AVR on warfarin PTA which is being held for HD access procedure planned on 6/11 and bridged with IV heparin. Heparin level supratherapeutic at >2.20 this morning with no issues or changes in line per RN despite being therapeutic for the past few days. Held infusion x1 hour and rechecked heparin level and it is therapeutic at 0.53 (level drawn only ~20 minutes after infusion stopped). Hgb low at 7.0 this morning, no overt S/Sx bleeding noted per RN. Will resume infusion at slightly reduced dose and recheck heparin level.  Goal of Therapy:  Heparin level 0.3-0.7 units/ml Monitor platelets by anticoagulation  protocol: Yes   Plan:  -Resume heparin drip at 1200 units/hr -Check 6-hr heparin level -Daily heparin level, CBC, S/Sx bleeding -F/U resuming warfarin post-procedure on 6/11  Arrie Senate, PharmD PGY-1 Pharmacy Resident Pager: [phone] 07/20/2016

## 2016-07-20 NOTE — Progress Notes (Signed)
Patient's hemoglobin 7.0, she is alert, oriented x4, BP 118/62 this am, pulse 75, respiration 18, o O2 at 2 liters via [place], unable to obtain  O2 sats. Reading. No acute distress noted, denies discomfort, Dr. Posey Pronto aware

## 2016-07-20 NOTE — Progress Notes (Signed)
Call received from pharmacy to stop Heparin drip until further notice due to PTT level of 20.3

## 2016-07-20 NOTE — Plan of Care (Signed)
Problem: RH SKIN INTEGRITY Goal: RH STG SKIN FREE OF INFECTION/BREAKDOWN Skin to remain free from infection and breakdown while on rehab with min assist from staff.  Outcome: Not Progressing Sacral wound

## 2016-07-20 NOTE — Progress Notes (Signed)
Subjective/Complaints: Pt seen laying in bed this AM.  She slept well overnight.  She states she is tired from HD yesterday.  Hb also noted to be low per nursing.   ROS: Denies CP, SOB, N/V/D.   Objective: Vital Signs: Blood pressure 118/62, pulse 93, temperature 99.3 F (37.4 C), temperature source Oral, resp. rate 18, height 5' 4" (1.626 m), weight 49.3 kg (108 lb 11 oz), SpO2 100 %. Dg Chest 2 View  Result Date: 07/19/2016 CLINICAL DATA:  Shortness of breath. EXAM: CHEST  2 VIEW COMPARISON:  07/17/2016 and prior radiographs FINDINGS: Cardiomegaly, aortic valve replacement and right IJ central venous catheter with tip overlying the superior cavoatrial junction again noted. A moderate to large right pleural effusion appears slightly increased in size. A small left pleural effusion is again noted. Mild interstitial opacities are again identified likely representing mild interstitial edema. Bilateral lower lung atelectasis again noted. There is no evidence of pneumothorax. IMPRESSION: Moderate to large right pleural effusion which appears slightly increased since 07/17/2016. No other significant changes. Small left pleural effusion, probable mild interstitial pulmonary edema and bilateral lower lobe atelectasis again noted. Electronically Signed   By: Margarette Canada M.D.   On: 07/19/2016 10:30   Results for orders placed or performed during the hospital encounter of 07/10/16 (from the past 72 hour(s))  Glucose, capillary     Status: Abnormal   Collection Time: 07/17/16  4:32 PM  Result Value Ref Range   Glucose-Capillary 100 (H) 65 - 99 mg/dL  Heparin level (unfractionated)     Status: Abnormal   Collection Time: 07/17/16 10:00 PM  Result Value Ref Range   Heparin Unfractionated <0.10 (L) 0.30 - 0.70 IU/mL    Comment:        IF HEPARIN RESULTS ARE BELOW EXPECTED VALUES, AND PATIENT DOSAGE HAS BEEN CONFIRMED, SUGGEST FOLLOW UP TESTING OF ANTITHROMBIN III LEVELS. REPEATED TO VERIFY    Protime-INR     Status: Abnormal   Collection Time: 07/18/16  7:42 AM  Result Value Ref Range   Prothrombin Time 19.9 (H) 11.4 - 15.2 seconds   INR 1.67   CBC     Status: Abnormal   Collection Time: 07/18/16  7:42 AM  Result Value Ref Range   WBC 10.7 (H) 4.0 - 10.5 K/uL    Comment: WHITE COUNT CONFIRMED ON SMEAR   RBC 2.81 (L) 3.87 - 5.11 MIL/uL   Hemoglobin 8.3 (L) 12.0 - 15.0 g/dL   HCT 25.9 (L) 36.0 - 46.0 %   MCV 92.2 78.0 - 100.0 fL   MCH 29.5 26.0 - 34.0 pg   MCHC 32.0 30.0 - 36.0 g/dL   RDW 23.8 (H) 11.5 - 15.5 %   Platelets 282 150 - 400 K/uL    Comment: PLATELET COUNT CONFIRMED BY SMEAR  Heparin level (unfractionated)     Status: Abnormal   Collection Time: 07/18/16  7:42 AM  Result Value Ref Range   Heparin Unfractionated 0.14 (L) 0.30 - 0.70 IU/mL    Comment:        IF HEPARIN RESULTS ARE BELOW EXPECTED VALUES, AND PATIENT DOSAGE HAS BEEN CONFIRMED, SUGGEST FOLLOW UP TESTING OF ANTITHROMBIN III LEVELS.   Glucose, capillary     Status: Abnormal   Collection Time: 07/18/16  7:54 AM  Result Value Ref Range   Glucose-Capillary 127 (H) 65 - 99 mg/dL  Glucose, capillary     Status: Abnormal   Collection Time: 07/18/16 12:03 PM  Result Value Ref Range  Glucose-Capillary 183 (H) 65 - 99 mg/dL  CBC     Status: Abnormal   Collection Time: 07/18/16  4:30 PM  Result Value Ref Range   WBC 10.9 (H) 4.0 - 10.5 K/uL   RBC 2.42 (L) 3.87 - 5.11 MIL/uL   Hemoglobin 7.1 (L) 12.0 - 15.0 g/dL   HCT 22.1 (L) 36.0 - 46.0 %   MCV 91.3 78.0 - 100.0 fL   MCH 29.3 26.0 - 34.0 pg   MCHC 32.1 30.0 - 36.0 g/dL   RDW 23.4 (H) 11.5 - 15.5 %   Platelets 259 150 - 400 K/uL    Comment: PLATELET COUNT CONFIRMED BY SMEAR  Renal function panel     Status: Abnormal   Collection Time: 07/18/16  4:30 PM  Result Value Ref Range   Sodium 132 (L) 135 - 145 mmol/L   Potassium 3.4 (L) 3.5 - 5.1 mmol/L   Chloride 96 (L) 101 - 111 mmol/L   CO2 26 22 - 32 mmol/L   Glucose, Bld 262 (H) 65 - 99  mg/dL   BUN 14 6 - 20 mg/dL   Creatinine, Ser 4.61 (H) 0.44 - 1.00 mg/dL   Calcium 8.5 (L) 8.9 - 10.3 mg/dL   Phosphorus 5.9 (H) 2.5 - 4.6 mg/dL   Albumin 1.6 (L) 3.5 - 5.0 g/dL   GFR calc non Af Amer 9 (L) >60 mL/min   GFR calc Af Amer 11 (L) >60 mL/min    Comment: (NOTE) The eGFR has been calculated using the CKD EPI equation. This calculation has not been validated in all clinical situations. eGFR's persistently <60 mL/min signify possible Chronic Kidney Disease.    Anion gap 10 5 - 15  Heparin level (unfractionated)     Status: None   Collection Time: 07/18/16  7:30 PM  Result Value Ref Range   Heparin Unfractionated 0.65 0.30 - 0.70 IU/mL    Comment:        IF HEPARIN RESULTS ARE BELOW EXPECTED VALUES, AND PATIENT DOSAGE HAS BEEN CONFIRMED, SUGGEST FOLLOW UP TESTING OF ANTITHROMBIN III LEVELS.   Glucose, capillary     Status: Abnormal   Collection Time: 07/18/16  8:16 PM  Result Value Ref Range   Glucose-Capillary 112 (H) 65 - 99 mg/dL  Protime-INR     Status: Abnormal   Collection Time: 07/19/16  4:58 AM  Result Value Ref Range   Prothrombin Time 17.1 (H) 11.4 - 15.2 seconds   INR 1.38   CBC     Status: Abnormal   Collection Time: 07/19/16  4:58 AM  Result Value Ref Range   WBC 11.7 (H) 4.0 - 10.5 K/uL   RBC 2.80 (L) 3.87 - 5.11 MIL/uL   Hemoglobin 8.4 (L) 12.0 - 15.0 g/dL   HCT 26.0 (L) 36.0 - 46.0 %   MCV 92.9 78.0 - 100.0 fL   MCH 30.0 26.0 - 34.0 pg   MCHC 32.3 30.0 - 36.0 g/dL   RDW 24.1 (H) 11.5 - 15.5 %   Platelets 294 150 - 400 K/uL  Heparin level (unfractionated)     Status: None   Collection Time: 07/19/16  4:58 AM  Result Value Ref Range   Heparin Unfractionated 0.64 0.30 - 0.70 IU/mL    Comment:        IF HEPARIN RESULTS ARE BELOW EXPECTED VALUES, AND PATIENT DOSAGE HAS BEEN CONFIRMED, SUGGEST FOLLOW UP TESTING OF ANTITHROMBIN III LEVELS.   Glucose, capillary     Status: Abnormal   Collection Time:  07/19/16  7:49 AM  Result Value Ref Range    Glucose-Capillary 112 (H) 65 - 99 mg/dL   Comment 1 Notify RN   Glucose, capillary     Status: Abnormal   Collection Time: 07/19/16 11:45 AM  Result Value Ref Range   Glucose-Capillary 164 (H) 65 - 99 mg/dL  Glucose, capillary     Status: None   Collection Time: 07/19/16  6:05 PM  Result Value Ref Range   Glucose-Capillary 82 65 - 99 mg/dL  Glucose, capillary     Status: Abnormal   Collection Time: 07/19/16  9:32 PM  Result Value Ref Range   Glucose-Capillary 156 (H) 65 - 99 mg/dL  Protime-INR     Status: Abnormal   Collection Time: 07/20/16  5:07 AM  Result Value Ref Range   Prothrombin Time 20.3 (H) 11.4 - 15.2 seconds   INR 1.71   CBC     Status: Abnormal   Collection Time: 07/20/16  5:07 AM  Result Value Ref Range   WBC 10.7 (H) 4.0 - 10.5 K/uL    Comment: WHITE COUNT CONFIRMED ON SMEAR   RBC 2.43 (L) 3.87 - 5.11 MIL/uL   Hemoglobin 7.0 (L) 12.0 - 15.0 g/dL   HCT 22.5 (L) 36.0 - 46.0 %   MCV 92.6 78.0 - 100.0 fL   MCH 28.8 26.0 - 34.0 pg   MCHC 31.1 30.0 - 36.0 g/dL   RDW 23.6 (H) 11.5 - 15.5 %   Platelets 284 150 - 400 K/uL    Comment: SPECIMEN CHECKED FOR CLOTS PLATELET COUNT CONFIRMED BY SMEAR   Heparin level (unfractionated)     Status: Abnormal   Collection Time: 07/20/16  5:07 AM  Result Value Ref Range   Heparin Unfractionated >2.20 (H) 0.30 - 0.70 IU/mL    Comment: REPEATED TO VERIFY RESULTS CONFIRMED BY MANUAL DILUTION        IF HEPARIN RESULTS ARE BELOW EXPECTED VALUES, AND PATIENT DOSAGE HAS BEEN CONFIRMED, SUGGEST FOLLOW UP TESTING OF ANTITHROMBIN III LEVELS.   Glucose, capillary     Status: Abnormal   Collection Time: 07/20/16  6:45 AM  Result Value Ref Range   Glucose-Capillary 106 (H) 65 - 99 mg/dL  Heparin level (unfractionated)     Status: None   Collection Time: 07/20/16  7:21 AM  Result Value Ref Range   Heparin Unfractionated 0.53 0.30 - 0.70 IU/mL    Comment:        IF HEPARIN RESULTS ARE BELOW EXPECTED VALUES, AND PATIENT DOSAGE  HAS BEEN CONFIRMED, SUGGEST FOLLOW UP TESTING OF ANTITHROMBIN III LEVELS.   Glucose, capillary     Status: Abnormal   Collection Time: 07/20/16 12:01 PM  Result Value Ref Range   Glucose-Capillary 145 (H) 65 - 99 mg/dL  Prepare RBC     Status: None   Collection Time: 07/20/16 12:43 PM  Result Value Ref Range   Order Confirmation ORDER PROCESSED BY BLOOD BANK   Type and screen [place]     Status: None (Preliminary result)   Collection Time: 07/20/16 12:43 PM  Result Value Ref Range   ABO/RH(D) A POS    Antibody Screen NEG    Sample Expiration 07/23/2016    Unit Number [id number]    Blood Component Type RED CELLS,LR    Unit division 00    Status of Unit ALLOCATED    Transfusion Status OK TO TRANSFUSE    Crossmatch Result Compatible    Unit Number [id number]  Blood Component Type RED CELLS,LR    Unit division 00    Status of Unit ALLOCATED    Transfusion Status OK TO TRANSFUSE    Crossmatch Result Compatible      HEENT: normocephalic and atraumatic.  Oral: poor dentition Cardio: RRR. No JVD. Resp:CTA B. Unlabored. GI: BS positive, nondistended Musc:  Edema R BKA with tenderness. Healed left BKA Neuro: Alert/Oriented Motor 4+/5 in BUE  LLE: HF, KE 4/5 RLE: HF 3/5, KE 3/5 (pain inhibition) Musc/Skel: no hypersensitivity to touch on R distal stump Gen NAD. Well developed Skin:   R BKA incision with dressing c/d/i  Assessment/Plan: 1. Functional deficits secondary to New right and recent left BKA which require 3+ hours per day of interdisciplinary therapy in a comprehensive inpatient rehab setting. Physiatrist is providing close team supervision and 24 hour management of active medical problems listed below. Physiatrist and rehab team continue to assess barriers to discharge/monitor patient progress toward functional and medical goals. FIM: Function - Bathing Position: Sitting EOB Body parts bathed by patient: Right arm, Left arm, Chest,  Abdomen, Front perineal area, Buttocks, Right upper leg, Left upper leg Body parts bathed by helper: Back Bathing not applicable: Left lower leg, Right lower leg Assist Level: Supervision or verbal cues  Function- Upper Body Dressing/Undressing What is the patient wearing?: Pull over shirt/dress Pull over shirt/dress - Perfomed by patient: Thread/unthread right sleeve, Thread/unthread left sleeve, Put head through opening, Pull shirt over trunk Assist Level: Set up Set up : To obtain clothing/put away Function - Lower Body Dressing/Undressing What is the patient wearing?: Underwear, Pants Position: Sitting EOB Underwear - Performed by patient: Thread/unthread right underwear leg, Thread/unthread left underwear leg, Pull underwear up/down Pants- Performed by patient: Thread/unthread right pants leg, Thread/unthread left pants leg, Pull pants up/down Pants- Performed by helper: Pull pants up/down Assist for lower body dressing: Supervision or verbal cues  Function - Toileting Toileting activity did not occur: No continent bowel/bladder event Toileting steps completed by patient: Adjust clothing prior to toileting, Performs perineal hygiene, Adjust clothing after toileting Toileting steps completed by helper: Adjust clothing prior to toileting, Adjust clothing after toileting Toileting Assistive Devices: Grab bar or rail Assist level: Supervision or verbal cues  Function - Air cabin crew transfer activity did not occur:  (not attempted) Toilet transfer assistive device: Bedside commode Assist level to toilet: Maximal assist (Pt 25 - 49%/lift and lower) Assist level from toilet: Moderate assist (Pt 50 - 74%/lift or lower) Assist level to bedside commode (at bedside): Moderate assist (Pt 50 - 74%/lift or lower) Assist level from bedside commode (at bedside): Supervision or verbal cues  Function - Chair/bed transfer Chair/bed transfer activity did not occur: N/A Chair/bed  transfer method: Lateral scoot Chair/bed transfer assist level: Touching or steadying assistance (Pt > 75%) Chair/bed transfer assistive device: Sliding board, Armrests  Function - Locomotion: Wheelchair Type: Manual Max wheelchair distance: 168f  Assist Level: Supervision or verbal cues Assist Level: Supervision or verbal cues Assist Level: Supervision or verbal cues Turns around,maneuvers to table,bed, and toilet,negotiates 3% grade,maneuvers on rugs and over doorsills: No Function - Locomotion: Ambulation Ambulation activity did not occur: Safety/medical concerns Walk 10 feet activity did not occur: Safety/medical concerns Walk 50 feet with 2 turns activity did not occur: Safety/medical concerns Walk 150 feet activity did not occur: Safety/medical concerns Walk 10 feet on uneven surfaces activity did not occur: Safety/medical concerns  Function - Comprehension Comprehension: Auditory Comprehension assist level: Understands complex 90% of the time/cues 10%  of the time  Function - Expression Expression: Verbal Expression assist level: Expresses complex 90% of the time/cues < 10% of the time  Function - Social Interaction Social Interaction assist level: Interacts appropriately 90% of the time - Needs monitoring or encouragement for participation or interaction.  Function - Problem Solving Problem solving assist level: Solves basic 90% of the time/requires cueing < 10% of the time  Function - Memory Memory assist level: Recognizes or recalls 90% of the time/requires cueing < 10% of the time Patient normally able to recall (first 3 days only): Current season, That he or she is in a hospital, Staff names and faces    Medical Problem List and Plan:  1. 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   Cont CIR   Pt needs to use KI for RLE.   Need access for HD, on IV heparin pending AV graft for Monday 6/11 2. DVT  Prophylaxis/Anticoagulation: Chronic Coumadin therapy for history of mechanical aortic valve  3. Pain Management: Gabapentin/TENS for phantom pain,  4. Mood: Provide emotional support  5. Neuropsych: This patient is capable of making decisions on her own behalf.  6. Skin/Wound Care: Dry dressing/ACE to right BK . Drainage decreasing. Local care to left BKA---almost healed  7. Fluids/Electrolytes/Nutrition: Routine I&Os  8. ESRD. Continue dialysis as directed. HD to occur at the end of the day to allow for participation in rehab activities per Nephro. Needed HD again today due to complains of SOB with fluid overload 9. Leukocytosis.   WBCs 10.7 on 6/10  Cont to monitor 10. Acute on chronic anemia. Continue Aranesp.   Hb 7.0 on 6/10, transfused to 2units PRBC per Nephro  Cont to monitor 11. Hypertension. Lopressor 25 mg twice a day. Monitor with increased mobility   Controlled 6/9 12. CAD/AVR.   Coumadin currently on hold for procedure  Cont heparin ggt 14. Diabetes mellitus of peripheral neuropathy. Hemoglobin A1c 5.7.Bouts of hypoglycemia with sliding scale discontinued  contol is fair, became hypoglycemic on glimipride  Relatively controlled 6/10 15. Hypothyroidism. Synthroid  17. SOB   CXR reviewed, showing pleural effusion  HD today  LOS (Days) 10 A FACE TO FACE EVALUATION WAS PERFORMED  Ankit [name] 07/20/2016, 2:24 PM

## 2016-07-21 ENCOUNTER — Encounter (HOSPITAL_COMMUNITY): Payer: Self-pay | Admitting: Anesthesiology

## 2016-07-21 ENCOUNTER — Encounter (HOSPITAL_COMMUNITY)
Admission: RE | Disposition: A | Discharge status: Home Health | Payer: Self-pay | Source: Intra-hospital | Attending: Physical Medicine & Rehabilitation

## 2016-07-21 ENCOUNTER — Inpatient Hospital Stay (HOSPITAL_COMMUNITY): Payer: Medicare Other | Admitting: Anesthesiology

## 2016-07-21 ENCOUNTER — Ambulatory Visit (HOSPITAL_COMMUNITY): Admission: RE | Admit: 2016-07-21 | Payer: Medicare Other | Source: Ambulatory Visit | Admitting: Vascular Surgery

## 2016-07-21 DIAGNOSIS — I5023 Acute on chronic systolic (congestive) heart failure: Secondary | ICD-10-CM | POA: Diagnosis not present

## 2016-07-21 DIAGNOSIS — N185 Chronic kidney disease, stage 5: Secondary | ICD-10-CM

## 2016-07-21 DIAGNOSIS — N186 End stage renal disease: Secondary | ICD-10-CM | POA: Diagnosis not present

## 2016-07-21 DIAGNOSIS — E1122 Type 2 diabetes mellitus with diabetic chronic kidney disease: Secondary | ICD-10-CM | POA: Diagnosis not present

## 2016-07-21 DIAGNOSIS — I120 Hypertensive chronic kidney disease with stage 5 chronic kidney disease or end stage renal disease: Secondary | ICD-10-CM | POA: Diagnosis not present

## 2016-07-21 HISTORY — PX: AV FISTULA PLACEMENT: SHX1204

## 2016-07-21 LAB — CBC
HCT: 34.7 % — ABNORMAL LOW (ref 36.0–46.0)
Hemoglobin: 11.5 g/dL — ABNORMAL LOW (ref 12.0–15.0)
MCH: 29.1 pg (ref 26.0–34.0)
MCHC: 32.9 g/dL (ref 30.0–36.0)
MCV: 88.5 fL (ref 78.0–100.0)
Platelets: 245 10*3/uL (ref 150–400)
RBC: 3.92 MIL/uL (ref 3.87–5.11)
RDW: 20.9 % — AB (ref 11.5–15.5)
WBC: 13.1 10*3/uL — AB (ref 4.0–10.5)

## 2016-07-21 LAB — TYPE AND SCREEN
ABO/RH(D): A POS
Antibody Screen: NEGATIVE
UNIT DIVISION: 0
UNIT DIVISION: 0

## 2016-07-21 LAB — BPAM RBC
Blood Product Expiration Date: 201806222359
Blood Product Expiration Date: 201806232359
ISSUE DATE / TIME: 201806101458
ISSUE DATE / TIME: 201806101458
UNIT TYPE AND RH: 6200
Unit Type and Rh: 6200

## 2016-07-21 LAB — PROTIME-INR
INR: 1.2
PROTHROMBIN TIME: 15.2 s (ref 11.4–15.2)

## 2016-07-21 LAB — GLUCOSE, CAPILLARY
GLUCOSE-CAPILLARY: 175 mg/dL — AB (ref 65–99)
Glucose-Capillary: 120 mg/dL — ABNORMAL HIGH (ref 65–99)
Glucose-Capillary: 96 mg/dL (ref 65–99)
Glucose-Capillary: 69 mg/dL (ref 65–99)
Glucose-Capillary: 162 mg/dL — ABNORMAL HIGH (ref 65–99)

## 2016-07-21 LAB — HEPARIN LEVEL (UNFRACTIONATED)
HEPARIN UNFRACTIONATED: 0.42 [IU]/mL (ref 0.30–0.70)
Heparin Unfractionated: 0.3 IU/mL (ref 0.30–0.70)

## 2016-07-21 SURGERY — INSERTION OF ARTERIOVENOUS (AV) GORE-TEX GRAFT ARM
Anesthesia: General | Site: Arm Upper | Laterality: Left

## 2016-07-21 MED ORDER — LIDOCAINE 2% (20 MG/ML) 5 ML SYRINGE
INTRAMUSCULAR | Status: AC
  Filled 2016-07-21: qty 5

## 2016-07-21 MED ORDER — LIDOCAINE HCL (CARDIAC) 20 MG/ML IV SOLN
INTRAVENOUS | Status: DC | PRN
  Administered 2016-07-21: 60 mg via INTRAVENOUS

## 2016-07-21 MED ORDER — PHENYLEPHRINE 40 MCG/ML (10ML) SYRINGE FOR IV PUSH (FOR BLOOD PRESSURE SUPPORT)
PREFILLED_SYRINGE | INTRAVENOUS | Status: AC
  Filled 2016-07-21: qty 10

## 2016-07-21 MED ORDER — FENTANYL CITRATE (PF) 250 MCG/5ML IJ SOLN
INTRAMUSCULAR | Status: AC
  Filled 2016-07-21: qty 5

## 2016-07-21 MED ORDER — CEFAZOLIN SODIUM-DEXTROSE 2-3 GM-% IV SOLR
2.0000 g | Freq: Once | INTRAVENOUS | Status: AC
  Administered 2016-07-21: 2 g via INTRAVENOUS

## 2016-07-21 MED ORDER — ONDANSETRON HCL 4 MG/2ML IJ SOLN
INTRAMUSCULAR | Status: AC
  Filled 2016-07-21: qty 2

## 2016-07-21 MED ORDER — CEFAZOLIN SODIUM-DEXTROSE 2-4 GM/100ML-% IV SOLN
INTRAVENOUS | Status: AC
  Filled 2016-07-21: qty 100

## 2016-07-21 MED ORDER — PRO-STAT SUGAR FREE PO LIQD
30.0000 mL | Freq: Two times a day (BID) | ORAL | Status: DC
  Administered 2016-07-22 – 2016-07-24 (×3): 30 mL via ORAL
  Filled 2016-07-21 (×6): qty 30

## 2016-07-21 MED ORDER — LIDOCAINE HCL (PF) 1 % IJ SOLN
INTRAMUSCULAR | Status: AC
  Filled 2016-07-21: qty 30

## 2016-07-21 MED ORDER — PROPOFOL 10 MG/ML IV BOLUS
INTRAVENOUS | Status: AC
  Filled 2016-07-21: qty 20

## 2016-07-21 MED ORDER — PROPOFOL 10 MG/ML IV BOLUS
INTRAVENOUS | Status: DC | PRN
  Administered 2016-07-21: 80 mg via INTRAVENOUS

## 2016-07-21 MED ORDER — LIDOCAINE HCL (PF) 1 % IJ SOLN
INTRAMUSCULAR | Status: DC | PRN
  Administered 2016-07-21: 30 mL

## 2016-07-21 MED ORDER — FENTANYL CITRATE (PF) 100 MCG/2ML IJ SOLN
INTRAMUSCULAR | Status: DC | PRN
  Administered 2016-07-21 (×2): 50 ug via INTRAVENOUS

## 2016-07-21 MED ORDER — PHENYLEPHRINE HCL 10 MG/ML IJ SOLN
INTRAMUSCULAR | Status: DC | PRN
  Administered 2016-07-21 (×5): 80 ug via INTRAVENOUS

## 2016-07-21 MED ORDER — WARFARIN - PHARMACIST DOSING INPATIENT: Freq: Every day | Status: DC

## 2016-07-21 MED ORDER — HEPARIN (PORCINE) IN NACL 100-0.45 UNIT/ML-% IJ SOLN
1200.0000 [IU]/h | INTRAMUSCULAR | Status: DC
  Administered 2016-07-21 – 2016-07-22 (×2): 1200 [IU]/h via INTRAVENOUS
  Filled 2016-07-21 (×2): qty 250

## 2016-07-21 MED ORDER — WARFARIN SODIUM 2 MG PO TABS
2.0000 mg | ORAL_TABLET | Freq: Once | ORAL | Status: AC
  Administered 2016-07-21: 2 mg via ORAL
  Filled 2016-07-21: qty 1

## 2016-07-21 MED ORDER — HEPARIN SODIUM (PORCINE) 1000 UNIT/ML IJ SOLN
INTRAMUSCULAR | Status: AC
  Filled 2016-07-21: qty 1

## 2016-07-21 MED ORDER — MIDAZOLAM HCL 2 MG/2ML IJ SOLN
INTRAMUSCULAR | Status: AC
  Filled 2016-07-21: qty 2

## 2016-07-21 MED ORDER — 0.9 % SODIUM CHLORIDE (POUR BTL) OPTIME
TOPICAL | Status: DC | PRN
  Administered 2016-07-21: 1000 mL

## 2016-07-21 MED ORDER — DEXTROSE 5 % IV SOLN
INTRAVENOUS | Status: DC | PRN
  Administered 2016-07-21: 50 ug/min via INTRAVENOUS

## 2016-07-21 MED ORDER — PROTAMINE SULFATE 10 MG/ML IV SOLN
INTRAVENOUS | Status: AC
  Filled 2016-07-21: qty 25

## 2016-07-21 MED ORDER — SODIUM CHLORIDE 0.9 % IV SOLN
INTRAVENOUS | Status: DC | PRN
  Administered 2016-07-21: 500 mL

## 2016-07-21 MED ORDER — ONDANSETRON HCL 4 MG/2ML IJ SOLN
INTRAMUSCULAR | Status: DC | PRN
  Administered 2016-07-21: 4 mg via INTRAVENOUS

## 2016-07-21 SURGICAL SUPPLY — 36 items
ARMBAND PINK RESTRICT EXTREMIT (MISCELLANEOUS) ×6 IMPLANT
CANISTER SUCT 3000ML PPV (MISCELLANEOUS) ×3 IMPLANT
CLIP TI MEDIUM 6 (CLIP) ×3 IMPLANT
CLIP TI WIDE RED SMALL 6 (CLIP) ×3 IMPLANT
COVER PROBE W GEL 5X96 (DRAPES) ×3 IMPLANT
DECANTER SPIKE VIAL GLASS SM (MISCELLANEOUS) ×3 IMPLANT
DERMABOND ADVANCED (GAUZE/BANDAGES/DRESSINGS) ×2
DERMABOND ADVANCED .7 DNX12 (GAUZE/BANDAGES/DRESSINGS) ×1 IMPLANT
ELECT REM PT RETURN 9FT ADLT (ELECTROSURGICAL) ×3
ELECTRODE REM PT RTRN 9FT ADLT (ELECTROSURGICAL) ×1 IMPLANT
GLOVE BIO SURGEON STRL SZ7 (GLOVE) ×3 IMPLANT
GLOVE BIOGEL PI IND STRL 6.5 (GLOVE) ×1 IMPLANT
GLOVE BIOGEL PI IND STRL 7.5 (GLOVE) ×1 IMPLANT
GLOVE BIOGEL PI INDICATOR 6.5 (GLOVE) ×2
GLOVE BIOGEL PI INDICATOR 7.5 (GLOVE) ×2
GLOVE ECLIPSE 7.5 STRL STRAW (GLOVE) ×3 IMPLANT
GLOVE SURG SS PI 7.0 STRL IVOR (GLOVE) ×6 IMPLANT
GOWN SPEC L4 XLG W/TWL (GOWN DISPOSABLE) ×3 IMPLANT
GOWN STRL REUS W/ TWL LRG LVL3 (GOWN DISPOSABLE) ×3 IMPLANT
GOWN STRL REUS W/TWL LRG LVL3 (GOWN DISPOSABLE) ×6
HEMOSTAT SPONGE AVITENE ULTRA (HEMOSTASIS) IMPLANT
KIT BASIN OR (CUSTOM PROCEDURE TRAY) ×3 IMPLANT
KIT ROOM TURNOVER OR (KITS) ×3 IMPLANT
NS IRRIG 1000ML POUR BTL (IV SOLUTION) ×3 IMPLANT
PACK CV ACCESS (CUSTOM PROCEDURE TRAY) ×3 IMPLANT
PAD ARMBOARD 7.5X6 YLW CONV (MISCELLANEOUS) ×6 IMPLANT
SUT GORETEX 6.0 TT13 (SUTURE) IMPLANT
SUT MNCRL AB 4-0 PS2 18 (SUTURE) ×3 IMPLANT
SUT PROLENE 6 0 BV (SUTURE) ×3 IMPLANT
SUT PROLENE 7 0 BV 1 (SUTURE) ×3 IMPLANT
SUT SILK 2 0 FS (SUTURE) IMPLANT
SUT VIC AB 3-0 SH 27 (SUTURE) ×4
SUT VIC AB 3-0 SH 27X BRD (SUTURE) ×2 IMPLANT
SYR TOOMEY 50ML (SYRINGE) IMPLANT
UNDERPAD 30X30 (UNDERPADS AND DIAPERS) ×3 IMPLANT
WATER STERILE IRR 1000ML POUR (IV SOLUTION) ×3 IMPLANT

## 2016-07-21 NOTE — Progress Notes (Signed)
[place] for Heparin Indication: mechanical AVR  Allergies  Allergen Reactions  . Acetaminophen Other (See Comments)    Liver transplant recipient   . Codeine Itching  . Mirtazapine Other (See Comments)    hallucination    Patient Measurements: Height: 5\' 4"  (162.6 cm) Weight: 109 lb (49.4 kg) IBW/kg (Calculated) : 54.7 Heparin Dosing Weight: 54.9 kg  Vital Signs: Temp: 98.9 F (37.2 C) (06/11 1500) Temp Source: Oral (06/11 1500) BP: 97/60 (06/11 1500) Pulse Rate: 83 (06/11 1500)  Labs:  Recent Labs  07/19/16 0458 07/20/16 0507  07/20/16 1441 07/20/16 2206 07/21/16 0533 07/21/16 2235  HGB 8.4* 7.0*  --   --   --  11.5*  --   HCT 26.0* 22.5*  --   --   --  34.7*  --   PLT 294 284  --   --   --  245  --   LABPROT 17.1* 20.3*  --   --   --  15.2  --   INR 1.38 1.71  --   --   --  1.20  --   HEPARINUNFRC 0.64 >2.20*  < >  --  0.57 0.42 0.30  CREATININE  --   --   --  2.86*  --   --   --   < > = values in this interval not displayed.  Estimated Creatinine Clearance: 16.1 mL/min (A) (by C-G formula based on SCr of 2.86 mg/dL (H)).  Assessment: [name] s/p AVF, h/o mechanical AVR for heparin Goal of Therapy:  Heparin level 0.3-0.7 units/ml Monitor platelets by anticoagulation protocol: Yes   Plan:  Continue Heparin at current rate  Phillis Knack, PharmD, BCPS  07/21/2016

## 2016-07-21 NOTE — Anesthesia Preprocedure Evaluation (Addendum)
Anesthesia Evaluation  Patient identified by MRN, date of birth, ID band Patient awake    Reviewed: Allergy & Precautions, NPO status , Patient's Chart, lab work & pertinent test results  Airway Mallampati: II  TM Distance: >3 FB Neck ROM: Full    Dental no notable dental hx. 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 per pt    Endo/Other  diabetesHypothyroidism   Renal/GU DialysisRenal diseaseLast HD 07/20/16  negative genitourinary   Musculoskeletal negative musculoskeletal ROS (+)   Abdominal   Peds negative pediatric ROS (+)  Hematology negative hematology ROS (+)   Anesthesia Other Findings   Reproductive/Obstetrics negative OB ROS                            Anesthesia Physical Anesthesia Plan  ASA: IV  Anesthesia Plan: General   Post-op Pain Management:    Induction: Intravenous  PONV Risk Score and Plan: 2 and Ondansetron and Dexamethasone  Airway Management Planned: LMA  Additional Equipment:   Intra-op Plan:   Post-operative Plan: Extubation in OR  Informed Consent: I have reviewed the patients History and Physical, chart, labs and discussed the procedure including the risks, benefits and alternatives for the proposed anesthesia with the patient or authorized representative who has indicated his/her understanding and acceptance.   Dental advisory given  Plan Discussed with: CRNA and Surgeon  Anesthesia Plan Comments:          Anesthesia Quick Evaluation

## 2016-07-21 NOTE — Discharge Instructions (Signed)
Inpatient Rehab Discharge Instructions  [name] Discharge date and time: No discharge date for patient encounter.   Activities/Precautions/ Functional Status: Activity: activity as tolerated Diet: Diabetic renal diet Wound Care: Keep wound clean and dry Functional status:  ___ No restrictions     ___ Walk up steps independently ___ 24/7 supervision/assistance   ___ Walk up steps with assistance ___ Intermittent supervision/assistance  ___ Bathe/dress independently ___ Walk with walker     _x__ Bathe/dress with assistance ___ Walk Independently    ___ Shower independently ___ Walk with assistance    ___ Shower with assistance ___ No alcohol     ___ Return to work/school ________    COMMUNITY REFERRALS UPON DISCHARGE:    Home Health:   PT     OT     RN                     Agency:  [facility] Phone: [phone]   Medical Equipment/Items Ordered: (right) amputee support pad for wheelchair, transfer board                                                      Agency/Supplier:  [facility] @ [phone]   GENERAL COMMUNITY RESOURCES FOR PATIENT/FAMILY:  Support Groups:  Amputee Support Group      Special Instructions: Continue hemodialysis as directed   Home health nurse to check INR 07/28/2016 results to [facility] [phone] fax number [phone]  My questions have been answered and I understand these instructions. I will adhere to these goals and the provided educational materials after my discharge from the hospital.  Patient/Caregiver Signature _______________________________ Date __________  Clinician Signature _______________________________________ Date __________  Please bring this form and your medication list with you to all your follow-up doctor's appointments.

## 2016-07-21 NOTE — Anesthesia Postprocedure Evaluation (Signed)
Anesthesia Post Note  Patient: [name]  Procedure(s) Performed: Procedure(s) (LRB): CREATION OF LEFT UPPER ARM ARTERIOVENOUS (AV) FISTULA (Left)     Patient location during evaluation: PACU Anesthesia Type: General Level of consciousness: awake and alert Pain management: pain level controlled Vital Signs Assessment: post-procedure vital signs reviewed and stable Respiratory status: spontaneous breathing, nonlabored ventilation, respiratory function stable and patient connected to nasal cannula oxygen Cardiovascular status: blood pressure returned to baseline and stable Postop Assessment: no signs of nausea or vomiting Anesthetic complications: no    Last Vitals:  Vitals:   07/21/16 0910 07/21/16 0915  BP:  107/62  Pulse: 76 66  Resp: 16 14  Temp:      Last Pain:  Vitals:   07/21/16 0858  TempSrc:   PainSc: 0-No pain                 Sibel Khurana S

## 2016-07-21 NOTE — Op Note (Signed)
OPERATIVE NOTE   PROCEDURE: 1. left first stage brachial vein transposition (brachiobrachial arteriovenous fistula) placement  PRE-OPERATIVE DIAGNOSIS: end stage renal disease   POST-OPERATIVE DIAGNOSIS: same as above   SURGEON: Adele Barthel, MD  ASSISTANT(S): Surgical tech  ANESTHESIA: general, local   ESTIMATED BLOOD LOSS: 50 cc  FINDING(S): 1. Adequate brachial vein and artery for attempt at brachial vein transposition  2. Weak brachial pulse 3. Dopplerable radial signal at end of case.  No radial pulse at beginning of case 4. Faintly palpable thrill  SPECIMEN(S):  none  INDICATIONS:   [name] is a 61 y.o. female who presents with end stage renal disease.  The patient is scheduled for left arm graft.  I also discussed with the patient proceeding with first stage brachial vein transposition if her vein was adequate given she was already hypotensive, which would make any arteriovenous graft at risk for thrombosing.  The patient is aware the risks include but are not limited to: bleeding, infection, steal syndrome, nerve damage, ischemic monomelic neuropathy, failure to mature, and need for additional procedures.  I explicitedly discussed that this was a STAGED procedure so a second operation might be needed.  The patient is aware of the risks of the procedure and elects to proceed forward.   DESCRIPTION: After full informed written consent was obtained from the patient, the patient was brought back to the operating room and placed supine upon the operating table.  Prior to induction, the patient received IV antibiotics.   After obtaining adequate anesthesia, the patient was then prepped and draped in the standard fashion for a left arm access procedure.   I turned my attention first to identifying the patient's brachial vein and brachial artery.  Using SonoSite guidance, the location of these vessels were marked out on the skin.   The vein and artery looked big enough for  use for a fistula construction.  At this point, I injected local anesthetic to obtain a field block of the antecubitum.  In total, I injected about 5 mL of 1% lidocaine without epinephrine.  I made a longitudinal incision at the level of the antecubitum and dissected through the subcutaneous tissue and fascia to gain exposure of the brachial artery.  This was noted to be 3 mm in diameter externally.  This was dissected out proximally and distally and controlled with vessel loops .  I then dissected out the brachial vein.  This was noted to be 2.5-3.0 mm in diameter externally.  The distal segment of the vein was ligated with a  2-0 silk, and the vein was transected.  The proximal segment was interrogated with serial dilators.  The vein accepted up to a 3.5 mm dilator without any difficulty.  I then instilled the heparinized saline into the vein and clamped it.  At this point, I reset my exposure of the brachial artery and placed the artery under tension proximally and distally.  I made an arteriotomy with a #11 blade, and then I extended the arteriotomy with a Potts scissor.  I injected heparinized saline proximal and distal to this arteriotomy.  The vein was then sewn to the artery in an end-to-side configuration with a running stitch of 7-0 Prolene.  Prior to completing this anastomosis, I allowed the vein and artery to backbleed.  There was no evidence of clot from any vessels.    I completed the anastomosis in the usual fashion and then released all vessel loops and clamps.  There was  a faintly palpable thrill in the venous outflow, and there was a dopplerable radial signal with no dopplerable ulnar signal.    At this point, I irrigated out the surgical wound.  There was no further active bleeding.  The subcutaneous tissue was reapproximated with a running stitch of 3-0 Vicryl.  The skin was then reapproximated with a running subcuticular stitch of 4-0 Vicryl.  The skin was then cleaned, dried, and  reinforced with Liquidband.  The patient tolerated this procedure well.    COMPLICATIONS: none  CONDITION: stable   Adele Barthel, MD, [facility] [place] Office: [phone] Pager: [phone]  07/21/2016, 8:46 AM

## 2016-07-21 NOTE — Transfer of Care (Signed)
Immediate Anesthesia Transfer of Care Note  Patient: [name]  Procedure(s) Performed: Procedure(s): CREATION OF LEFT UPPER ARM ARTERIOVENOUS (AV) FISTULA (Left)  Patient Location: PACU  Anesthesia Type:General  Level of Consciousness: awake, oriented and patient cooperative  Airway & Oxygen Therapy: Patient Spontanous Breathing and Patient connected to face mask oxygen  Post-op Assessment: Report given to RN and Post -op Vital signs reviewed and stable  Post vital signs: Reviewed  Last Vitals:  Vitals:   07/20/16 1940 07/21/16 0512  BP: 129/73 112/60  Pulse: 89 79  Resp:  20  Temp:  37.3 C    Last Pain:  Vitals:   07/21/16 0512  TempSrc: Oral  PainSc:       Patients Stated Pain Goal: 0 (45/91/36 8599)  Complications: No apparent anesthesia complications

## 2016-07-21 NOTE — Progress Notes (Signed)
Subjective/Complaints: Going down for AVG placement  ROS: pt denies nausea, vomiting, diarrhea, cough, shortness of breath or chest pain   Objective: Vital Signs: Blood pressure (!) 79/50, pulse 81, temperature 98.4 F (36.9 C), temperature source Oral, resp. rate 16, height 5' 4"  (1.626 m), weight 51.3 kg (113 lb), SpO2 100 %. No results found. Results for orders placed or performed during the hospital encounter of 07/10/16 (from the past 72 hour(s))  Glucose, capillary     Status: Abnormal   Collection Time: 07/18/16 12:03 PM  Result Value Ref Range   Glucose-Capillary 183 (H) 65 - 99 mg/dL  CBC     Status: Abnormal   Collection Time: 07/18/16  4:30 PM  Result Value Ref Range   WBC 10.9 (H) 4.0 - 10.5 K/uL   RBC 2.42 (L) 3.87 - 5.11 MIL/uL   Hemoglobin 7.1 (L) 12.0 - 15.0 g/dL   HCT 22.1 (L) 36.0 - 46.0 %   MCV 91.3 78.0 - 100.0 fL   MCH 29.3 26.0 - 34.0 pg   MCHC 32.1 30.0 - 36.0 g/dL   RDW 23.4 (H) 11.5 - 15.5 %   Platelets 259 150 - 400 K/uL    Comment: PLATELET COUNT CONFIRMED BY SMEAR  Renal function panel     Status: Abnormal   Collection Time: 07/18/16  4:30 PM  Result Value Ref Range   Sodium 132 (L) 135 - 145 mmol/L   Potassium 3.4 (L) 3.5 - 5.1 mmol/L   Chloride 96 (L) 101 - 111 mmol/L   CO2 26 22 - 32 mmol/L   Glucose, Bld 262 (H) 65 - 99 mg/dL   BUN 14 6 - 20 mg/dL   Creatinine, Ser 4.61 (H) 0.44 - 1.00 mg/dL   Calcium 8.5 (L) 8.9 - 10.3 mg/dL   Phosphorus 5.9 (H) 2.5 - 4.6 mg/dL   Albumin 1.6 (L) 3.5 - 5.0 g/dL   GFR calc non Af Amer 9 (L) >60 mL/min   GFR calc Af Amer 11 (L) >60 mL/min    Comment: (NOTE) The eGFR has been calculated using the CKD EPI equation. This calculation has not been validated in all clinical situations. eGFR's persistently <60 mL/min signify possible Chronic Kidney Disease.    Anion gap 10 5 - 15  Heparin level (unfractionated)     Status: None   Collection Time: 07/18/16  7:30 PM  Result Value Ref Range   Heparin  Unfractionated 0.65 0.30 - 0.70 IU/mL    Comment:        IF HEPARIN RESULTS ARE BELOW EXPECTED VALUES, AND PATIENT DOSAGE HAS BEEN CONFIRMED, SUGGEST FOLLOW UP TESTING OF ANTITHROMBIN III LEVELS.   Glucose, capillary     Status: Abnormal   Collection Time: 07/18/16  8:16 PM  Result Value Ref Range   Glucose-Capillary 112 (H) 65 - 99 mg/dL  Protime-INR     Status: Abnormal   Collection Time: 07/19/16  4:58 AM  Result Value Ref Range   Prothrombin Time 17.1 (H) 11.4 - 15.2 seconds   INR 1.38   CBC     Status: Abnormal   Collection Time: 07/19/16  4:58 AM  Result Value Ref Range   WBC 11.7 (H) 4.0 - 10.5 K/uL   RBC 2.80 (L) 3.87 - 5.11 MIL/uL   Hemoglobin 8.4 (L) 12.0 - 15.0 g/dL   HCT 26.0 (L) 36.0 - 46.0 %   MCV 92.9 78.0 - 100.0 fL   MCH 30.0 26.0 - 34.0 pg   MCHC 32.3  30.0 - 36.0 g/dL   RDW 24.1 (H) 11.5 - 15.5 %   Platelets 294 150 - 400 K/uL  Heparin level (unfractionated)     Status: None   Collection Time: 07/19/16  4:58 AM  Result Value Ref Range   Heparin Unfractionated 0.64 0.30 - 0.70 IU/mL    Comment:        IF HEPARIN RESULTS ARE BELOW EXPECTED VALUES, AND PATIENT DOSAGE HAS BEEN CONFIRMED, SUGGEST FOLLOW UP TESTING OF ANTITHROMBIN III LEVELS.   Glucose, capillary     Status: Abnormal   Collection Time: 07/19/16  7:49 AM  Result Value Ref Range   Glucose-Capillary 112 (H) 65 - 99 mg/dL   Comment 1 Notify RN   Glucose, capillary     Status: Abnormal   Collection Time: 07/19/16 11:45 AM  Result Value Ref Range   Glucose-Capillary 164 (H) 65 - 99 mg/dL  Glucose, capillary     Status: None   Collection Time: 07/19/16  6:05 PM  Result Value Ref Range   Glucose-Capillary 82 65 - 99 mg/dL  Glucose, capillary     Status: Abnormal   Collection Time: 07/19/16  9:32 PM  Result Value Ref Range   Glucose-Capillary 156 (H) 65 - 99 mg/dL  Protime-INR     Status: Abnormal   Collection Time: 07/20/16  5:07 AM  Result Value Ref Range   Prothrombin Time 20.3 (H)  11.4 - 15.2 seconds   INR 1.71   CBC     Status: Abnormal   Collection Time: 07/20/16  5:07 AM  Result Value Ref Range   WBC 10.7 (H) 4.0 - 10.5 K/uL    Comment: WHITE COUNT CONFIRMED ON SMEAR   RBC 2.43 (L) 3.87 - 5.11 MIL/uL   Hemoglobin 7.0 (L) 12.0 - 15.0 g/dL   HCT 22.5 (L) 36.0 - 46.0 %   MCV 92.6 78.0 - 100.0 fL   MCH 28.8 26.0 - 34.0 pg   MCHC 31.1 30.0 - 36.0 g/dL   RDW 23.6 (H) 11.5 - 15.5 %   Platelets 284 150 - 400 K/uL    Comment: SPECIMEN CHECKED FOR CLOTS PLATELET COUNT CONFIRMED BY SMEAR   Heparin level (unfractionated)     Status: Abnormal   Collection Time: 07/20/16  5:07 AM  Result Value Ref Range   Heparin Unfractionated >2.20 (H) 0.30 - 0.70 IU/mL    Comment: REPEATED TO VERIFY RESULTS CONFIRMED BY MANUAL DILUTION        IF HEPARIN RESULTS ARE BELOW EXPECTED VALUES, AND PATIENT DOSAGE HAS BEEN CONFIRMED, SUGGEST FOLLOW UP TESTING OF ANTITHROMBIN III LEVELS.   Glucose, capillary     Status: Abnormal   Collection Time: 07/20/16  6:45 AM  Result Value Ref Range   Glucose-Capillary 106 (H) 65 - 99 mg/dL  Heparin level (unfractionated)     Status: None   Collection Time: 07/20/16  7:21 AM  Result Value Ref Range   Heparin Unfractionated 0.53 0.30 - 0.70 IU/mL    Comment:        IF HEPARIN RESULTS ARE BELOW EXPECTED VALUES, AND PATIENT DOSAGE HAS BEEN CONFIRMED, SUGGEST FOLLOW UP TESTING OF ANTITHROMBIN III LEVELS.   Glucose, capillary     Status: Abnormal   Collection Time: 07/20/16 12:01 PM  Result Value Ref Range   Glucose-Capillary 145 (H) 65 - 99 mg/dL  Prepare RBC     Status: None   Collection Time: 07/20/16 12:43 PM  Result Value Ref Range   Order Confirmation ORDER PROCESSED  BY BLOOD BANK   Type and screen [place]     Status: None   Collection Time: 07/20/16 12:43 PM  Result Value Ref Range   ABO/RH(D) A POS    Antibody Screen NEG    Sample Expiration 07/23/2016    Unit Number [id number]    Blood Component  Type RED CELLS,LR    Unit division 00    Status of Unit ISSUED,FINAL    Transfusion Status OK TO TRANSFUSE    Crossmatch Result Compatible    Unit Number [id number]    Blood Component Type RED CELLS,LR    Unit division 00    Status of Unit ISSUED,FINAL    Transfusion Status OK TO TRANSFUSE    Crossmatch Result Compatible   Renal function panel     Status: Abnormal   Collection Time: 07/20/16  2:41 PM  Result Value Ref Range   Sodium 135 135 - 145 mmol/L   Potassium 3.5 3.5 - 5.1 mmol/L   Chloride 100 (L) 101 - 111 mmol/L   CO2 26 22 - 32 mmol/L   Glucose, Bld 163 (H) 65 - 99 mg/dL   BUN 5 (L) 6 - 20 mg/dL   Creatinine, Ser 2.86 (H) 0.44 - 1.00 mg/dL    Comment: DELTA CHECK NOTED   Calcium 8.8 (L) 8.9 - 10.3 mg/dL   Phosphorus 3.9 2.5 - 4.6 mg/dL   Albumin 1.7 (L) 3.5 - 5.0 g/dL   GFR calc non Af Amer 17 (L) >60 mL/min   GFR calc Af Amer 19 (L) >60 mL/min    Comment: (NOTE) The eGFR has been calculated using the CKD EPI equation. This calculation has not been validated in all clinical situations. eGFR's persistently <60 mL/min signify possible Chronic Kidney Disease.    Anion gap 9 5 - 15  Glucose, capillary     Status: Abnormal   Collection Time: 07/20/16  7:01 PM  Result Value Ref Range   Glucose-Capillary 113 (H) 65 - 99 mg/dL  Heparin level (unfractionated)     Status: None   Collection Time: 07/20/16 10:06 PM  Result Value Ref Range   Heparin Unfractionated 0.57 0.30 - 0.70 IU/mL    Comment:        IF HEPARIN RESULTS ARE BELOW EXPECTED VALUES, AND PATIENT DOSAGE HAS BEEN CONFIRMED, SUGGEST FOLLOW UP TESTING OF ANTITHROMBIN III LEVELS.   Glucose, capillary     Status: Abnormal   Collection Time: 07/20/16 10:21 PM  Result Value Ref Range   Glucose-Capillary 188 (H) 65 - 99 mg/dL  Glucose, capillary     Status: Abnormal   Collection Time: 07/21/16  5:19 AM  Result Value Ref Range   Glucose-Capillary 120 (H) 65 - 99 mg/dL  CBC     Status: Abnormal    Collection Time: 07/21/16  5:33 AM  Result Value Ref Range   WBC 13.1 (H) 4.0 - 10.5 K/uL   RBC 3.92 3.87 - 5.11 MIL/uL   Hemoglobin 11.5 (L) 12.0 - 15.0 g/dL    Comment: DELTA CHECK NOTED REPEATED TO VERIFY POST TRANSFUSION SPECIMEN    HCT 34.7 (L) 36.0 - 46.0 %   MCV 88.5 78.0 - 100.0 fL   MCH 29.1 26.0 - 34.0 pg   MCHC 32.9 30.0 - 36.0 g/dL   RDW 20.9 (H) 11.5 - 15.5 %   Platelets 245 150 - 400 K/uL  Heparin level (unfractionated)     Status: None   Collection Time: 07/21/16  5:33 AM  Result Value Ref Range   Heparin Unfractionated 0.42 0.30 - 0.70 IU/mL    Comment:        IF HEPARIN RESULTS ARE BELOW EXPECTED VALUES, AND PATIENT DOSAGE HAS BEEN CONFIRMED, SUGGEST FOLLOW UP TESTING OF ANTITHROMBIN III LEVELS.   Protime-INR     Status: None   Collection Time: 07/21/16  5:33 AM  Result Value Ref Range   Prothrombin Time 15.2 11.4 - 15.2 seconds   INR 1.20   Glucose, capillary     Status: None   Collection Time: 07/21/16  9:10 AM  Result Value Ref Range   Glucose-Capillary 96 65 - 99 mg/dL     HEENT: normocephalic and atraumatic.  Oral: poor dentition Cardio: RRR without murmur. No JVD. Resp:CTA B. Unlabored. GI: BS positive, nondistended Musc:  Edema R BKA with tenderness. Healed left BKA Neuro: Alert/Oriented Motor 4+/5 in BUE  LLE: HF, KE 4/5 RLE: HF 3/5, KE 3/5 (pain inhibition) Musc/Skel: no hypersensitivity to touch on R distal stump Gen NAD. Well developed Skin:   R BKA incision with dressing c/d/i  Assessment/Plan: 1. Functional deficits secondary to New right and recent left BKA which require 3+ hours per day of interdisciplinary therapy in a comprehensive inpatient rehab setting. Physiatrist is providing close team supervision and 24 hour management of active medical problems listed below. Physiatrist and rehab team continue to assess barriers to discharge/monitor patient progress toward functional and medical goals. FIM: Function - Bathing Position:  Sitting EOB Body parts bathed by patient: Right arm, Left arm, Chest, Abdomen, Front perineal area, Buttocks, Right upper leg, Left upper leg Body parts bathed by helper: Back Bathing not applicable: Left lower leg, Right lower leg Assist Level: Supervision or verbal cues  Function- Upper Body Dressing/Undressing What is the patient wearing?: Pull over shirt/dress Pull over shirt/dress - Perfomed by patient: Thread/unthread right sleeve, Thread/unthread left sleeve, Put head through opening, Pull shirt over trunk Assist Level: Set up Set up : To obtain clothing/put away Function - Lower Body Dressing/Undressing What is the patient wearing?: Underwear, Pants Position: Sitting EOB Underwear - Performed by patient: Thread/unthread right underwear leg, Thread/unthread left underwear leg, Pull underwear up/down Pants- Performed by patient: Thread/unthread right pants leg, Thread/unthread left pants leg, Pull pants up/down Pants- Performed by helper: Pull pants up/down Assist for lower body dressing: Supervision or verbal cues  Function - Toileting Toileting activity did not occur: No continent bowel/bladder event Toileting steps completed by patient: Adjust clothing prior to toileting, Performs perineal hygiene, Adjust clothing after toileting Toileting steps completed by helper: Adjust clothing prior to toileting, Adjust clothing after toileting Toileting Assistive Devices: Grab bar or rail Assist level: Supervision or verbal cues  Function - Air cabin crew transfer activity did not occur:  (not attempted) Toilet transfer assistive device: Bedside commode Assist level to toilet: Maximal assist (Pt 25 - 49%/lift and lower) Assist level from toilet: Moderate assist (Pt 50 - 74%/lift or lower) Assist level to bedside commode (at bedside): Moderate assist (Pt 50 - 74%/lift or lower) Assist level from bedside commode (at bedside): Supervision or verbal cues  Function - Chair/bed  transfer Chair/bed transfer activity did not occur: N/A Chair/bed transfer method: Lateral scoot Chair/bed transfer assist level: Touching or steadying assistance (Pt > 75%) Chair/bed transfer assistive device: Sliding board, Armrests  Function - Locomotion: Wheelchair Type: Manual Max wheelchair distance: 130f  Assist Level: Supervision or verbal cues Assist Level: Supervision or verbal cues Assist Level: Supervision or verbal cues Turns around,maneuvers to  table,bed, and toilet,negotiates 3% grade,maneuvers on rugs and over doorsills: No Function - Locomotion: Ambulation Ambulation activity did not occur: Safety/medical concerns Walk 10 feet activity did not occur: Safety/medical concerns Walk 50 feet with 2 turns activity did not occur: Safety/medical concerns Walk 150 feet activity did not occur: Safety/medical concerns Walk 10 feet on uneven surfaces activity did not occur: Safety/medical concerns  Function - Comprehension Comprehension: Auditory Comprehension assist level: Understands complex 90% of the time/cues 10% of the time  Function - Expression Expression: Verbal Expression assist level: Expresses complex 90% of the time/cues < 10% of the time  Function - Social Interaction Social Interaction assist level: Interacts appropriately 90% of the time - Needs monitoring or encouragement for participation or interaction.  Function - Problem Solving Problem solving assist level: Solves basic 90% of the time/requires cueing < 10% of the time  Function - Memory Memory assist level: Recognizes or recalls 90% of the time/requires cueing < 10% of the time Patient normally able to recall (first 3 days only): Current season, That he or she is in a hospital, Staff names and faces    Medical Problem List and Plan:  1. 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   Cont CIR   Pt needs to use KI for RLE.   Pt s/p AV  graft today 2. DVT Prophylaxis/Anticoagulation: Chronic Coumadin therapy for history of mechanical aortic valve  3. Pain Management: Gabapentin/TENS for phantom pain,  4. Mood: Provide emotional support  5. Neuropsych: This patient is capable of making decisions on her own behalf.  6. Skin/Wound Care: Dry dressing/ACE to right BK . Drainage decreasing. Local care to left BKA---almost healed  7. Fluids/Electrolytes/Nutrition: Routine I&Os  8. ESRD. Continue dialysis as directed. HD to occur at the end of the day to allow for participation in rehab activities per Nephro. Needed HD again today due to complains of SOB with fluid overload 9. Leukocytosis.   WBCs 10.7 on 6/10  Cont to monitor 10. Acute on chronic anemia. Continue Aranesp.   Hb 7.0 on 6/10, transfused to 2units PRBC per Nephro---11.5 today 6/11  Cont to monitor 11. Hypertension. Lopressor 25 mg twice a day. Monitor with increased mobility   Controlled   12. CAD/AVR.   Coumadin/heparin prior to procedure 14. Diabetes mellitus of peripheral neuropathy. Hemoglobin A1c 5.7.Bouts of hypoglycemia with sliding scale discontinued  contol is fair, became hypoglycemic on glimipride  Fair control 15. Hypothyroidism. Synthroid  17. SOB   CXR reviewed, showing pleural effusion  Volume mgt per renal  LOS (Days) 11 A FACE TO FACE EVALUATION WAS PERFORMED  Arlene Genova T 07/21/2016, 10:45 AM

## 2016-07-21 NOTE — Progress Notes (Signed)
ANTICOAGULATION CONSULT NOTE - Follow Up Consult  Pharmacy Consult for heparin and coumadin Indication: hx of mechanical AVR  Allergies  Allergen Reactions  . Acetaminophen Other (See Comments)    Liver transplant recipient   . Codeine Itching  . Mirtazapine Other (See Comments)    hallucination    Patient Measurements: Height: 5\' 4"  (162.6 cm) Weight: 113 lb (51.3 kg) IBW/kg (Calculated) : 54.7 Heparin Dosing Weight:   Vital Signs: Temp: 98.4 F (36.9 C) (06/11 1009) Temp Source: Oral (06/11 1009) BP: 79/50 (06/11 1009) Pulse Rate: 81 (06/11 1009)  Labs:  Recent Labs  07/18/16 1630  07/19/16 0458 07/20/16 0507 07/20/16 0721 07/20/16 1441 07/20/16 2206 07/21/16 0533  HGB 7.1*  --  8.4* 7.0*  --   --   --  11.5*  HCT 22.1*  --  26.0* 22.5*  --   --   --  34.7*  PLT 259  --  294 284  --   --   --  245  LABPROT  --   --  17.1* 20.3*  --   --   --  15.2  INR  --   --  1.38 1.71  --   --   --  1.20  HEPARINUNFRC  --   < > 0.64 >2.20* 0.53  --  0.57 0.42  CREATININE 4.61*  --   --   --   --  2.86*  --   --   < > = values in this interval not displayed.  Estimated Creatinine Clearance: 16.7 mL/min (A) (by C-G formula based on SCr of 2.86 mg/dL (H)).   Medications:  Scheduled:  . aspirin EC  81 mg Oral Daily  . calcitRIOL  0.5 mcg Oral Q M,W,F-HD  . calcium acetate  667 mg Oral TID WC  . darbepoetin (ARANESP) injection - DIALYSIS  200 mcg Intravenous Q Mon-HD  . docusate sodium  100 mg Oral Daily  . feeding supplement  1 Container Oral TID BM  . gabapentin  300 mg Oral QHS  . levothyroxine  175 mcg Oral QAC breakfast  . metoCLOPramide  5 mg Oral BID AC  . metoprolol tartrate  12.5 mg Oral BID  . multivitamin  1 tablet Oral QHS  . polyethylene glycol  17 g Oral BID  . senna-docusate  1 tablet Oral BID  . tacrolimus  2 mg Oral BID   Infusions:  . ceFAZolin    . ferric gluconate (FERRLECIT/NULECIT) IV    . heparin 1,200 Units/hr (07/21/16 0600)     Assessment: 61 yo female with hx of mechanical heart valve will be resume on heparin 6 hours post vascular procedure.  MD is also ok restarting coumadin tonight.  Patient was previously therapeutic on heparin at 1200 units/hr.  Last Hgb 11.5 and Plt 245 K; INR was 1.2.  AET was at 0902 today.  Goal of Therapy:  Heparin level 0.3-0.7 units/ml; INR 2.5-3.5 Monitor platelets by anticoagulation protocol: Yes   Plan:  - Restart heparin at 1200 units/hr at 1500 today. No bolus - coumadin 2 mg po x1 - heparin level in 8hr post start of infusion - daily heparin level, CBC and INR  Neyah Ellerman, Tsz-Yin 07/21/2016,10:57 AM

## 2016-07-21 NOTE — H&P (Signed)
Brief History and Physical  History of Present Illness  [name] is a 61 y.o. female who presents with chief complaint: ESRD.  The patient presents today for LUA AVG.  Pt has been told she is at increased risk of steal syndrome.    Past Medical History:  Diagnosis Date  . Anemia    takes Folic Acid daily  . Anxiety   . Aortic stenosis   . Arthritis    "left hand, back" (08/30/2012)  . Asthma   . CAD (coronary artery disease)   . CAD (coronary artery disease) Jan. 2015   Cath: 20% LAD, 50% D1; s/p LIMA-LAD  . Chest pain 03/06/2015  . CHF (congestive heart failure) ([place]) 05/2016  . Chronic back pain   . Chronic bronchitis ([place])    "q yr w/season changes" (08/30/2012)  . Chronic constipation    takes MIralax and Colace daily  . COPD (chronic obstructive pulmonary disease) ([place])   . Depression    takes Cymbalta for "severe" depression  . End stage renal disease on dialysis [facility]) 02/27/2011   Kidneys shut down at time of liver transplant in Sept 2011 at [facility] in [place], she has been on HD ever since.  Dialyzes at [facility] HD on TTS schedule.  Had L forearm graft used 10 months then removed Dec 2012 due to suspected infection.  A right upper arm AVG was placed Dec 2012 but she developed steal symptoms acutely and it was ligated the same day.  Never had an AV fistula due to small veins.  Now has L thigh AVG put in Jan 2013, has not clotted to date.    [name] GERD (gastroesophageal reflux disease)    takes Omeprazole daily  . Headache    "at least monthly" (08/30/2012)  . Hepatitis C   . History of blood transfusion    "several" (08/30/2012)  . Hypertension    takes Metoprolol and Lisinopril daily, sees Dr Bea Graff  . Hypothyroidism    takes Synthroid daily  . Migraine    "last migraine was in 2013" (08/30/2012)  . Neuromuscular disorder ([place])    carpal tunnel in right hand  . Obesity   . Peripheral vascular disease (HCC) hands and legs  . Pneumonia    "today and several  times before" (08/30/2012)  . 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 S/P unilateral BKA (below knee amputation), left ([place]) 04/30/2016  . SVT (supraventricular tachycardia) ([place]) 06/09/14  . Tobacco abuse   . Type II diabetes mellitus (HCC)    Levemir 2units daily if > 150    Past Surgical History:  Procedure Laterality Date  . ABDOMINAL AORTOGRAM W/LOWER EXTREMITY N/A 07/04/2016   Procedure: Abdominal Aortogram w/Lower Extremity;  Surgeon: Elam Dutch, MD;  Location: [place] CV [facility];  Service: Cardiovascular;  Laterality: N/A;  . AMPUTATION Left 04/24/2016   Procedure: AMPUTATION BELOW KNEE;  Surgeon: Angelia Mould, MD;  Location: [place];  Service: Vascular;  Laterality: Left;  . AMPUTATION Right 07/08/2016   Procedure: AMPUTATION BELOW KNEE;  Surgeon: Angelia Mould, MD;  Location: [place];  Service: Vascular;  Laterality: Right;  . AORTIC VALVE REPLACEMENT N/A 03/15/2013   AVR; Surgeon: Ivin Poot, MD;  Location: [facility] OR; Open Heart Surgery;  20mm[employer] mechanical prosthesis, top hat  valve  . ARTERIOVENOUS GRAFT PLACEMENT Left 10/03/10    forearm  . AV FISTULA PLACEMENT  01/29/2011   Procedure: INSERTION OF ARTERIOVENOUS (AV) GORE-TEX GRAFT ARM;  Surgeon: Elam Dutch, MD;  Location: [facility] OR;  Service: Vascular;  Laterality: Right;  . AV FISTULA PLACEMENT  03/10/2011   Procedure: INSERTION OF ARTERIOVENOUS (AV) GORE-TEX GRAFT THIGH;  Surgeon: Elam Dutch, MD;  Location: [place];  Service: Vascular;  Laterality: Left;  . [place] REMOVAL  12/23/2010   Procedure: REMOVAL OF ARTERIOVENOUS GORETEX GRAFT ([place]);  Surgeon: Elam Dutch, MD;  Location: [place];  Service: Vascular;  Laterality: Left;  procedure started @1736 -4097  . CHOLECYSTECTOMY  1993  . CORONARY ARTERY BYPASS GRAFT N/A 03/15/2013   Procedure: CORONARY ARTERY  BYPASS GRAFTING (CABG) times one using left internal mammary artery.;  Surgeon: Ivin Poot, MD;  Location: [place];  Service: Open Heart Surgery;  Laterality: N/A;  POSS CABG X 1  . CYSTOSCOPY  1990's  . INSERTION OF DIALYSIS CATHETER  12/23/2010   Procedure: INSERTION OF DIALYSIS CATHETER;  Surgeon: Elam Dutch, MD;  Location: [place];  Service: Vascular;  Laterality: Right;  Right Internal Jugular 28cm dialysis catheter insertion procedure time 1701-1720   . INSERTION OF DIALYSIS CATHETER Right 03/11/2016   Procedure: INSERTION OF DIALYSIS CATHETER;  Surgeon: Angelia Mould, MD;  Location: [place];  Service: Vascular;  Laterality: Right;  . INTRAOPERATIVE TRANSESOPHAGEAL ECHOCARDIOGRAM N/A 03/15/2013   Procedure: INTRAOPERATIVE TRANSESOPHAGEAL ECHOCARDIOGRAM;  Surgeon: Ivin Poot, MD;  Location: [place];  Service: Open Heart Surgery;  Laterality: N/A;  . LEFT HEART CATHETERIZATION WITH CORONARY ANGIOGRAM N/A 07/29/2012   Procedure: LEFT HEART CATHETERIZATION WITH CORONARY ANGIOGRAM;  Surgeon: Troy Sine, MD;  Location: [facility] CATH LAB;  Service: Cardiovascular;  Laterality: N/A;  . LEFT HEART CATHETERIZATION WITH CORONARY ANGIOGRAM N/A 03/10/2013   Procedure: LEFT HEART CATHETERIZATION WITH CORONARY ANGIOGRAM;  Surgeon: Troy Sine, MD;  Location: [facility] CATH LAB;  Service: Cardiovascular;  Laterality: N/A;  . LEFT HEART CATHETERIZATION WITH CORONARY/GRAFT ANGIOGRAM N/A 12/24/2011   Procedure: LEFT HEART CATHETERIZATION WITH Beatrix Fetters;  Surgeon: [name], MD;  Location: [facility] CATH LAB;  Service: Cardiovascular;  Laterality: N/A;  . LEFT HEART CATHETERIZATION WITH CORONARY/GRAFT ANGIOGRAM N/A 12/16/2013   Procedure: LEFT HEART CATHETERIZATION WITH Beatrix Fetters;  Surgeon: Troy Sine, MD;  Location: [facility] CATH LAB;  Service: Cardiovascular;  Laterality: N/A;  . LIGATION ARTERIOVENOUS GORTEX GRAFT Left 03/11/2016   Procedure: LIGATION THIGH ARTERIOVENOUS GORTEX  GRAFT;  Surgeon: Angelia Mould, MD;  Location: [place];  Service: Vascular;  Laterality: Left;  . LIVER TRANSPLANT  10/25/2009   sees Dr Ferol Luz 1 every 6 months, saw last in Dec 2013. [name] Coord [phone]  . PERIPHERAL VASCULAR CATHETERIZATION N/A 11/06/2015   Procedure: Abdominal Aortogram;  Surgeon: Serafina Mitchell, MD;  Location: [place] [facility];  Service: Cardiovascular;  Laterality: N/A;  . PERIPHERAL VASCULAR CATHETERIZATION N/A 11/06/2015   Procedure: Lower Extremity Angiography;  Surgeon: Serafina Mitchell, MD;  Location: [place] CV LAB;  Service: Cardiovascular;  Laterality: N/A;  . PERIPHERAL VASCULAR CATHETERIZATION  11/06/2015   Procedure: Peripheral Vascular Atherectomy;  Surgeon: Serafina Mitchell, MD;  Location: [place] [facility];  Service: Cardiovascular;;  Left Superficial femoral  . SHUNTOGRAM Left 05/15/2014   Procedure: SHUNTOGRAM;  Surgeon: Conrad Rudd, MD;  Location: [facility] CATH LAB;  Service: Cardiovascular;  Laterality: Left;  . SMALL INTESTINE SURGERY  90's  . SPINAL GROWTH RODS  2010   "put 2 metal rods in my back; they had detetriorated" (08/30/2012)  . THROMBECTOMY    . THROMBECTOMY AND REVISION OF ARTERIOVENTOUS (AV) GORETEX  GRAFT Left 03/30/2014  . THROMBECTOMY AND REVISION OF ARTERIOVENTOUS (AV) GORETEX  GRAFT Left 03/30/2014   Procedure: THROMBECTOMY AND REVISION OF ARTERIOVENTOUS (AV) GORETEX  GRAFT;  Surgeon: Conrad L[place], MD;  Location: [place];  Service: Vascular;  Laterality: Left;  . TUBAL LIGATION  1990's    Social History   Social History  . Marital status: Divorced    Spouse name: N/A  . Number of children: N/A  . Years of education: N/A   Occupational History  . Not on file.   Social History Main Topics  . Smoking status: Current Every Day Smoker    Packs/day: 0.50    Years: 40.00    Types: Cigarettes  . Smokeless tobacco: Never Used  . Alcohol use No  . Drug use: No  . Sexual activity: No   Other Topics Concern  . Not on  file   Social History Narrative  . No narrative on file    Family History  Problem Relation Age of Onset  . Cancer Mother   . Diabetes Mother   . Hypertension Mother   . Stroke Mother   . Cancer Father   . Anesthesia problems Neg Hx   . Hypotension Neg Hx   . Malignant hyperthermia Neg Hx   . Pseudochol deficiency Neg Hx     No current facility-administered medications on file prior to encounter.    Current Outpatient Prescriptions on File Prior to Encounter  Medication Sig Dispense Refill  . aspirin EC 81 MG tablet Take 81 mg by mouth daily at 12 noon.     . calcitRIOL (ROCALTROL) 0.5 MCG capsule Take 1 capsule (0.5 mcg total) by mouth every Monday, Wednesday, and Friday with hemodialysis. 30 capsule 1  . Calcium Carbonate Antacid (TUMS PO) Take 1 tablet by mouth daily with lunch.    . collagenase (SANTYL) ointment Apply 1 application topically daily.    . feeding supplement (BOOST / RESOURCE BREEZE) LIQD Take 1 Container by mouth 3 (three) times daily between meals. (Patient not taking: Reported on 06/30/2016) 1 Container 100  . HYDROmorphone (DILAUDID) 2 MG tablet Take 1-2 tablets (2-4 mg total) by mouth every 6 (six) hours as needed for severe pain.  0  . levothyroxine (SYNTHROID, LEVOTHROID) 175 MCG tablet Take 175 mcg by mouth daily at 12 noon.    . methocarbamol (ROBAXIN) 500 MG tablet Take 0.5 tablets (250 mg total) by mouth every 6 (six) hours as needed for muscle spasms. 30 tablet 0  . metoCLOPramide (REGLAN) 5 MG tablet Take 1 tablet (5 mg total) by mouth 2 (two) times daily before a meal. Decrease to once a day before breakfast after a couple of weeks. (Patient taking differently: Take 5 mg by mouth daily before lunch. ) 30 tablet 0  . metoprolol tartrate (LOPRESSOR) 25 MG tablet Take 1 tablet (25 mg total) by mouth 2 (two) times daily. (Patient taking differently: Take 25 mg by mouth See admin instructions. Take 1 tablet (25 mg) by mouth twice daily - 11am and 11pm) 60  tablet 0  . multivitamin (RENA-VIT) TABS tablet Take 1 tablet by mouth at bedtime. 30 tablet 0  . nitroGLYCERIN (NITROSTAT) 0.4 MG SL tablet Place 1 tablet (0.4 mg total) under the tongue every 5 (five) minutes as needed  for chest pain. 30 tablet 0  . ondansetron (ZOFRAN ODT) 4 MG disintegrating tablet Take 1 tablet (4 mg total) by mouth every 8 (eight) hours as needed for nausea. 30 tablet 0  . pantoprazole (PROTONIX) 40 MG tablet Take 1 tablet (40 mg total) by mouth daily. (Patient not taking: Reported on 06/30/2016) 30 tablet 1  . polyethylene glycol (MIRALAX / GLYCOLAX) packet Take 17 g by mouth 2 (two) times daily. (Patient taking differently: Take 17 g by mouth 2 (two) times daily as needed (constipation). Mix in 8 oz liquid and drink) 60 each 0  . pregabalin (LYRICA) 75 MG capsule Take 1 capsule (75 mg total) by mouth daily. (Patient taking differently: Take 75 mg by mouth 3 (three) times daily. ) 30 capsule 0  . senna-docusate (SENOKOT-S) 8.6-50 MG tablet Take 1 tablet by mouth daily at 12 noon.     . tacrolimus (PROGRAF) 1 MG capsule Take 2 capsules (2 mg total) by mouth 2 (two) times daily. Take 2 capsules twice daily. (Patient taking differently: Take 2 mg by mouth 2 (two) times daily. ) 120 capsule 0  . warfarin (COUMADIN) 2.5 MG tablet Take 1 tablet (2.5 mg total) by mouth daily at 6 PM. (Patient taking differently: Take 1.25-2.5 mg by mouth See admin instructions. Take 1 tablet (2.5 mg) by mouth on Tuesday, Thursday and Saturday at bedtime, take 1/2 tablet (1.25 mg) on Monday, Wednesday and Friday at bedtime) 30 tablet 0    Allergies  Allergen Reactions  . Acetaminophen Other (See Comments)    Liver transplant recipient   . Codeine Itching  . Mirtazapine Other (See Comments)    hallucination    Review of Systems: As listed above, otherwise negative.   Physical Examination  Vitals:   07/20/16 1905 07/20/16 1940 07/21/16 0512 07/21/16 0551  BP: (!) 90/49 129/73 112/60   Pulse:  87 89 79   Resp:   20   Temp: 98.5 F (36.9 C)  99.2 F (37.3 C)   TempSrc: Oral  Oral   SpO2:   100%   Weight:    113 lb (51.3 kg)  Height:        General: A&O x 3, WDWN  Pulmonary: Sym exp, good air movt, CTAB, no rales, rhonchi, & wheezing  Cardiac: RRR, Nl S1, S2, no Murmurs, rubs or gallops  Musculoskeletal: findings c/w prior L FA AVG  Laboratory   CBC CBC Latest Ref Rng & Units 07/21/2016 07/20/2016 07/19/2016  WBC 4.0 - 10.5 K/uL 13.1(H) 10.7(H) 11.7(H)  Hemoglobin 12.0 - 15.0 g/dL 11.5(L) 7.0(L) 8.4(L)  Hematocrit 36.0 - 46.0 % 34.7(L) 22.5(L) 26.0(L)  Platelets 150 - 400 K/uL 245 284 294    BMP BMP Latest Ref Rng & Units 07/20/2016 07/18/2016 07/17/2016  Glucose 65 - 99 mg/dL 163(H) 262(H) 134(H)  BUN 6 - 20 mg/dL 5(L) 14 7  Creatinine 0.44 - 1.00 mg/dL 2.86(H) 4.61(H) 3.10(H)  Sodium 135 - 145 mmol/L 135 132(L) 132(L)  Potassium 3.5 - 5.1 mmol/L 3.5 3.4(L) 3.5  Chloride 101 - 111 mmol/L 100(L) 96(L) 96(L)  CO2 22 - 32 mmol/L 26 26 26   Calcium 8.9 - 10.3 mg/dL 8.8(L) 8.5(L) 8.6(L)    Coagulation Lab Results  Component Value Date   INR 1.20 07/21/2016   INR 1.71 07/20/2016   INR 1.38 07/19/2016   No results found for: PTT  Lipids    Component Value Date/Time   CHOL 225 (H) 12/22/2011 1030   TRIG 121 12/22/2011 1030  HDL 97 12/22/2011 1030   CHOLHDL 2.3 12/22/2011 1030   VLDL 24 12/22/2011 1030   LDLCALC 104 (H) 12/22/2011 1030    Medical Decision Making  [name] is a 61 y.o. female who presents with: ESRD.   The patient is scheduled for: LUA AVG.  I have discussed proceed with a staged brachial vein transposition if her vein is adequate.  Risk, benefits, and alternatives to access surgery were discussed.  The patient is aware the risks include but are not limited to: bleeding, infection, steal syndrome, nerve damage, ischemic monomelic neuropathy, failure to mature, and need for additional procedures.  The patient is at increased risk for  steal syndrome.  She is aware of such.  The patient is aware of the risks and agrees to proceed.  Adele Barthel, MD, FACS [facility] Office: [phone] Pager: [phone]  07/21/2016, 7:04 AM

## 2016-07-21 NOTE — Anesthesia Procedure Notes (Signed)
Procedure Name: LMA Insertion Date/Time: 07/21/2016 7:43 AM Performed by: Luciana Axe K Pre-anesthesia Checklist: Patient identified, Emergency Drugs available, Suction available and Patient being monitored Patient Re-evaluated:Patient Re-evaluated prior to inductionOxygen Delivery Method: Circle System Utilized Preoxygenation: Pre-oxygenation with 100% oxygen Intubation Type: IV induction Ventilation: Mask ventilation without difficulty LMA: LMA inserted LMA Size: 4.0 Number of attempts: 1 Airway Equipment and Method: Bite block Placement Confirmation: positive ETCO2 and breath sounds checked- equal and bilateral Tube secured with: Tape Dental Injury: Teeth and Oropharynx as per pre-operative assessment

## 2016-07-21 NOTE — Progress Notes (Signed)
02 sat. Monitor tried on forehead, nose, bilateral fingers, ear lobes to no avail. Per [phone] RN, it is very hard to obtain 02 sat. on patient. Wave form comes and goes away. Patient alert, oriented, denies any pain as of this time.

## 2016-07-21 NOTE — Progress Notes (Signed)
Patient received at approximately 1009 from PACU. Patient received by H. Renee Rival, RN. Patient noted with + bruit and thrill right upper extremity. B/P noted to be low at 79/50. Continue to monitor. Continue with plan of care. [name]

## 2016-07-21 NOTE — Progress Notes (Signed)
Late entry. Patient returned from PACU and new weight required due to patient being weighed on overlay mattress bed which caused increase in weight result. Patient weighed on stand up scale in wheelchair and weight of wheelchair subtracted from weight due to patient with bilateral BKA's. . Weight of patient in wheelchair 147.2 lbs . Wheelchair weight 38.2 lbs. Final weight 49.442kg. Continue with plan of care. Mliss Sax

## 2016-07-21 NOTE — Progress Notes (Signed)
[facility] Progress Note   Subjective:     Objective Vitals:   07/21/16 0915 07/21/16 0930 07/21/16 0948 07/21/16 1009  BP: 107/62 (!) 99/58  (!) 79/50  Pulse: 66 66 78 81  Resp: 14 12 16 16   Temp:  97.3 F (36.3 C)  98.4 F (36.9 C)  TempSrc:    Oral  SpO2:  92% 100% 100%  Weight:      Height:       Physical Exam General: Heart: Lungs: Abdomen: Extremities: Dialysis Access:   Dialysis Orders:  Additional Objective Labs: Basic Metabolic Panel:  Recent Labs Lab 07/16/16 1553 07/17/16 0603 07/18/16 1630 07/20/16 1441  NA 128* 132* 132* 135  K 3.5 3.5 3.4* 3.5  CL 93* 96* 96* 100*  CO2 24 26 26 26   GLUCOSE 288* 134* 262* 163*  BUN 17 7 14  5*  CREATININE 5.56* 3.10* 4.61* 2.86*  CALCIUM 9.0 8.6* 8.5* 8.8*  PHOS 6.5*  --  5.9* 3.9   Liver Function Tests:  Recent Labs Lab 07/16/16 1553 07/18/16 1630 07/20/16 1441  ALBUMIN 1.7* 1.6* 1.7*   No results for input(s): LIPASE, AMYLASE in the last 168 hours. CBC:  Recent Labs Lab 07/18/16 0742 07/18/16 1630 07/19/16 0458 07/20/16 0507 07/21/16 0533  WBC 10.7* 10.9* 11.7* 10.7* 13.1*  HGB 8.3* 7.1* 8.4* 7.0* 11.5*  HCT 25.9* 22.1* 26.0* 22.5* 34.7*  MCV 92.2 91.3 92.9 92.6 88.5  PLT 282 259 294 284 245   Blood Culture    Component Value Date/Time   SDES BLOOD RIGHT HAND 06/30/2016 2247   SPECREQUEST  06/30/2016 2247    BOTTLES DRAWN AEROBIC AND ANAEROBIC Blood Culture adequate volume   CULT NO GROWTH 5 DAYS 06/30/2016 2247   REPTSTATUS 07/06/2016 FINAL 06/30/2016 2247    Cardiac Enzymes: No results for input(s): CKTOTAL, CKMB, CKMBINDEX, TROPONINI in the last 168 hours. CBG:  Recent Labs Lab 07/20/16 1201 07/20/16 1901 07/20/16 2221 07/21/16 0519 07/21/16 0910  GLUCAP 145* 113* 188* 120* 96   Iron Studies: No results for input(s): IRON, TIBC, TRANSFERRIN, FERRITIN in the last 72 hours. @lablastinr3 @ Studies/Results: No results found. Medications: . ceFAZolin     . ferric gluconate (FERRLECIT/NULECIT) IV    . heparin     . aspirin EC  81 mg Oral Daily  . calcitRIOL  0.5 mcg Oral Q M,W,F-HD  . calcium acetate  667 mg Oral TID WC  . darbepoetin (ARANESP) injection - DIALYSIS  200 mcg Intravenous Q Mon-HD  . docusate sodium  100 mg Oral Daily  . feeding supplement  1 Container Oral TID BM  . gabapentin  300 mg Oral QHS  . levothyroxine  175 mcg Oral QAC breakfast  . metoCLOPramide  5 mg Oral BID AC  . metoprolol tartrate  12.5 mg Oral BID  . multivitamin  1 tablet Oral QHS  . polyethylene glycol  17 g Oral BID  . senna-docusate  1 tablet Oral BID  . tacrolimus  2 mg Oral BID  . warfarin  2 mg Oral ONCE-1800  . Warfarin - Pharmacist Dosing Inpatient   Does not apply q1800   Dialysis:  [name] MWF 3.5h  R IJ cath    50kg   Hep none  2/2 bath   -Calcitriol 0.5 mcg PO q HD -Mircera 50 mcg IV q 4 weeks   Assessment/Plan: 1. Gangrene R foot/S/P R BKA : On on rehab.  2. Dyspnea/Pleural effusions: Had HD yesterday. O2 sats 99% on RA. Denies SOB.  2. ESRD - HD off schedule 06/10. Will dialyze off schedule tomorrow and then back on schedule Wednesday. K+ 3.5 Use 4.0 K bath.  3. Anemia - HGB down to 7.0 rec'd 2 units PRBCs 07/20/16. HGB 11.5 today. follow HGB. ESA ordered.  4. Secondary hyperparathyroidism - continue binders/VDRA. Phos 3.9 Ca 8.8 5. HTN/volume - BP soft. May need to lower metoprolol dose. HD yesterday-pre wt 51 kg Net UF 3500 Post wt 47.5 kg. Wt back up to 51 kgs today??? Re-weight pt. Bed changed since last wt. No evidence of volume overload.  6. Nutrition - Albumin 1.7. Severe PCM. Renal diet, boost/ add prostat.  7. 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  8. H/O liver transplant. Cont immunosuppressant therapy.  9. DM; Per primary  [name] NP-C 07/21/2016, 11:18 AM  [facility] [phone]  Pt seen, examined and agree w A/P as above. Extra HD x 2 over over the w/e for vol overlaod and SOB.  For new AV  graft today.  [name] MD [name] pager [phone]   07/21/2016, 12:17 PM

## 2016-07-22 ENCOUNTER — Inpatient Hospital Stay (HOSPITAL_COMMUNITY): Payer: Medicare Other | Admitting: Occupational Therapy

## 2016-07-22 ENCOUNTER — Inpatient Hospital Stay (HOSPITAL_COMMUNITY): Payer: Medicare Other | Admitting: Physical Therapy

## 2016-07-22 ENCOUNTER — Encounter (HOSPITAL_COMMUNITY): Payer: Self-pay | Admitting: Vascular Surgery

## 2016-07-22 DIAGNOSIS — I10 Essential (primary) hypertension: Secondary | ICD-10-CM | POA: Diagnosis not present

## 2016-07-22 LAB — HEPARIN LEVEL (UNFRACTIONATED): Heparin Unfractionated: 0.44 IU/mL (ref 0.30–0.70)

## 2016-07-22 LAB — CBC
HCT: 50.8 % — ABNORMAL HIGH (ref 36.0–46.0)
HCT: 37.8 % (ref 36.0–46.0)
HEMOGLOBIN: 17.2 g/dL — AB (ref 12.0–15.0)
HEMOGLOBIN: 12.5 g/dL (ref 12.0–15.0)
MCH: 30.6 pg (ref 26.0–34.0)
MCH: 30.1 pg (ref 26.0–34.0)
MCHC: 33.9 g/dL (ref 30.0–36.0)
MCHC: 33.1 g/dL (ref 30.0–36.0)
MCV: 90.2 fL (ref 78.0–100.0)
MCV: 91.1 fL (ref 78.0–100.0)
PLATELETS: 189 10*3/uL (ref 150–400)
Platelets: 88 10*3/uL — ABNORMAL LOW (ref 150–400)
RBC: 5.63 MIL/uL — AB (ref 3.87–5.11)
RBC: 4.15 MIL/uL (ref 3.87–5.11)
RDW: 21.4 % — ABNORMAL HIGH (ref 11.5–15.5)
RDW: 20.9 % — ABNORMAL HIGH (ref 11.5–15.5)
WBC: 7.3 10*3/uL (ref 4.0–10.5)
WBC: 11.7 10*3/uL — AB (ref 4.0–10.5)

## 2016-07-22 LAB — GLUCOSE, CAPILLARY
GLUCOSE-CAPILLARY: 114 mg/dL — AB (ref 65–99)
GLUCOSE-CAPILLARY: 122 mg/dL — AB (ref 65–99)
GLUCOSE-CAPILLARY: 151 mg/dL — AB (ref 65–99)
GLUCOSE-CAPILLARY: 213 mg/dL — AB (ref 65–99)

## 2016-07-22 LAB — PROTIME-INR
INR: 1.18
Prothrombin Time: 15 seconds (ref 11.4–15.2)

## 2016-07-22 MED ORDER — WARFARIN SODIUM 2.5 MG PO TABS
2.5000 mg | ORAL_TABLET | Freq: Once | ORAL | Status: AC
  Administered 2016-07-22: 2.5 mg via ORAL
  Filled 2016-07-22: qty 1

## 2016-07-22 NOTE — Progress Notes (Signed)
Occupational Therapy Session Note  Patient Details  Name: [name] MRN: [id number] Date of Birth: 11-Feb-1955  Today's Date: 07/22/2016 OT Individual Time: 1035-1200 and 1400-1500 OT Individual Time Calculation (min): 85 min and 60 min   Short Term Goals: Week 2:  OT Short Term Goal 1 (Week 2): STG=LTG due to LOS  Skilled Therapeutic Interventions/Progress Updates:    Session One: Pt seen for OT ADL bathing/dressing session. Pt sitting up in w/c upon arrival, agreeable to tx session and desiring to bathe/ dress. Completed bathing/dressing from w/c level at sink with set-up assist. VCs for problem solving technique for LB clothing management and buttock hygiene. Chair placed next to w/c and pt able to complete lateral lean into chair in order to complete buttock/ pericare hygiene. She required min A for pulling pants over hips due to fatigue with lateral leans and increasing pain/ tightness in R knee. RN aware of complaints of pain and medication administered during session. Played gospel music per pt request for pain management/ distraction during transfer practice.  Worked with pt on directing caregiver for how to assist with dressing and transfers throughout session. She completed A/P transfer to [facility] PT unable to recall technique for transferring to [facility] (has standard BSC at home, not drop arm, so unable to complete sliding board transfer to [facility]). With assist for recalling technique, pt completed A/P transfer with supervision and lateral leans for clothing management. With increased time and rest breaks, pt able to complete clothing management independently.  She returned to w/c and left sitting up in w/c with all needs in reach.  Pt with complaints of tingling feeling in L fingertips. Finger tips felt colder L>R. RN made aware.   Session Two: Pt seen for OT session focusing on functional transfers. Pt sitting up in w/c upon arrival, agreeable to tx session, pain better managed this session.  She self propelled w/c throughout unit for UE strengthening/ endurance. Competed sliding board transfer w/c <> low soft surface couch. Completed at overall supervision level requiring min questioning cues for proper set-up of w/c in prep for transfer. While seated on couch, pt with emesis episode, however, declined feeling ill and agreeable to cont with therapy. [name] RN made aware of episode. In therapy gym, completed A/P transfer w/c> supine on mat. Completed with overall supervision. Throughout session required min A and VCs for w/c part management. She returned to room at end of session, left sitting up in w/c with all needs in reach. Throughout session educated regarding DME, importance of proper nutrition for healing, and d/c planning.   Therapy Documentation Precautions:  Precautions Precautions: Fall Restrictions Weight Bearing Restrictions: Yes RLE Weight Bearing: Non weight bearing LLE Weight Bearing: Non weight bearing Pain: Pain Assessment Pain Score: 3   See Function Navigator for Current Functional Status.   Therapy/Group: Individual Therapy  Lewis, Dathan Attia C 07/22/2016, 7:15 AM

## 2016-07-22 NOTE — Progress Notes (Signed)
Subjective/Complaints: Lying in bed. Left arm feels surprisingly well. Still having pain in right stump however.   ROS: pt denies nausea, vomiting, diarrhea, cough, shortness of breath or chest pain   Objective: Vital Signs: Blood pressure 120/60, pulse 76, temperature 98.5 F (36.9 C), temperature source Oral, resp. rate 16, height _0  (1.626 m), weight 52.6 kg (116 lb), SpO2 99 %. No results found. Results for orders placed or performed during the hospital encounter of 07/10/16 (from the past 72 hour(s))  Glucose, capillary     Status: Abnormal   Collection Time: 07/19/16 11:45 AM  Result Value Ref Range   Glucose-Capillary 164 (H) 65 - 99 mg/dL  Glucose, capillary     Status: None   Collection Time: 07/19/16  6:05 PM  Result Value Ref Range   Glucose-Capillary 82 65 - 99 mg/dL  Glucose, capillary     Status: Abnormal   Collection Time: 07/19/16  9:32 PM  Result Value Ref Range   Glucose-Capillary 156 (H) 65 - 99 mg/dL  Protime-INR     Status: Abnormal   Collection Time: 07/20/16  5:07 AM  Result Value Ref Range   Prothrombin Time 20.3 (H) 11.4 - 15.2 seconds   INR 1.71   CBC     Status: Abnormal   Collection Time: 07/20/16  5:07 AM  Result Value Ref Range   WBC 10.7 (H) 4.0 - 10.5 K/uL    Comment: WHITE COUNT CONFIRMED ON SMEAR   RBC 2.43 (L) 3.87 - 5.11 MIL/uL   Hemoglobin 7.0 (L) 12.0 - 15.0 g/dL   HCT 22.5 (L) 36.0 - 46.0 %   MCV 92.6 78.0 - 100.0 fL   MCH 28.8 26.0 - 34.0 pg   MCHC 31.1 30.0 - 36.0 g/dL   RDW 23.6 (H) 11.5 - 15.5 %   Platelets 284 150 - 400 K/uL    Comment: SPECIMEN CHECKED FOR CLOTS PLATELET COUNT CONFIRMED BY SMEAR   Heparin level (unfractionated)     Status: Abnormal   Collection Time: 07/20/16  5:07 AM  Result Value Ref Range   Heparin Unfractionated >2.20 (H) 0.30 - 0.70 IU/mL    Comment: REPEATED TO VERIFY RESULTS CONFIRMED BY MANUAL DILUTION        IF HEPARIN RESULTS ARE BELOW EXPECTED VALUES, AND PATIENT DOSAGE HAS BEEN  CONFIRMED, SUGGEST FOLLOW UP TESTING OF ANTITHROMBIN III LEVELS.   Glucose, capillary     Status: Abnormal   Collection Time: 07/20/16  6:45 AM  Result Value Ref Range   Glucose-Capillary 106 (H) 65 - 99 mg/dL  Heparin level (unfractionated)     Status: None   Collection Time: 07/20/16  7:21 AM  Result Value Ref Range   Heparin Unfractionated 0.53 0.30 - 0.70 IU/mL    Comment:        IF HEPARIN RESULTS ARE BELOW EXPECTED VALUES, AND PATIENT DOSAGE HAS BEEN CONFIRMED, SUGGEST FOLLOW UP TESTING OF ANTITHROMBIN III LEVELS.   Glucose, capillary     Status: Abnormal   Collection Time: 07/20/16 12:01 PM  Result Value Ref Range   Glucose-Capillary 145 (H) 65 - 99 mg/dL  Prepare RBC     Status: None   Collection Time: 07/20/16 12:43 PM  Result Value Ref Range   Order Confirmation ORDER PROCESSED BY BLOOD BANK   Type and screen [place]     Status: None   Collection Time: 07/20/16 12:43 PM  Result Value Ref Range   ABO/RH(D) A POS  Antibody Screen NEG    Sample Expiration 07/23/2016    Unit Number [id number]    Blood Component Type RED CELLS,LR    Unit division 00    Status of Unit ISSUED,FINAL    Transfusion Status OK TO TRANSFUSE    Crossmatch Result Compatible    Unit Number [id number]    Blood Component Type RED CELLS,LR    Unit division 00    Status of Unit ISSUED,FINAL    Transfusion Status OK TO TRANSFUSE    Crossmatch Result Compatible   Renal function panel     Status: Abnormal   Collection Time: 07/20/16  2:41 PM  Result Value Ref Range   Sodium 135 135 - 145 mmol/L   Potassium 3.5 3.5 - 5.1 mmol/L   Chloride 100 (L) 101 - 111 mmol/L   CO2 26 22 - 32 mmol/L   Glucose, Bld 163 (H) 65 - 99 mg/dL   BUN 5 (L) 6 - 20 mg/dL   Creatinine, Ser 2.86 (H) 0.44 - 1.00 mg/dL    Comment: DELTA CHECK NOTED   Calcium 8.8 (L) 8.9 - 10.3 mg/dL   Phosphorus 3.9 2.5 - 4.6 mg/dL   Albumin 1.7 (L) 3.5 - 5.0 g/dL   GFR calc non Af Amer 17 (L) >60  mL/min   GFR calc Af Amer 19 (L) >60 mL/min    Comment: (NOTE) The eGFR has been calculated using the CKD EPI equation. This calculation has not been validated in all clinical situations. eGFR's persistently <60 mL/min signify possible Chronic Kidney Disease.    Anion gap 9 5 - 15  Glucose, capillary     Status: Abnormal   Collection Time: 07/20/16  7:01 PM  Result Value Ref Range   Glucose-Capillary 113 (H) 65 - 99 mg/dL  Heparin level (unfractionated)     Status: None   Collection Time: 07/20/16 10:06 PM  Result Value Ref Range   Heparin Unfractionated 0.57 0.30 - 0.70 IU/mL    Comment:        IF HEPARIN RESULTS ARE BELOW EXPECTED VALUES, AND PATIENT DOSAGE HAS BEEN CONFIRMED, SUGGEST FOLLOW UP TESTING OF ANTITHROMBIN III LEVELS.   Glucose, capillary     Status: Abnormal   Collection Time: 07/20/16 10:21 PM  Result Value Ref Range   Glucose-Capillary 188 (H) 65 - 99 mg/dL  Glucose, capillary     Status: Abnormal   Collection Time: 07/21/16  5:19 AM  Result Value Ref Range   Glucose-Capillary 120 (H) 65 - 99 mg/dL  CBC     Status: Abnormal   Collection Time: 07/21/16  5:33 AM  Result Value Ref Range   WBC 13.1 (H) 4.0 - 10.5 K/uL   RBC 3.92 3.87 - 5.11 MIL/uL   Hemoglobin 11.5 (L) 12.0 - 15.0 g/dL    Comment: DELTA CHECK NOTED REPEATED TO VERIFY POST TRANSFUSION SPECIMEN    HCT 34.7 (L) 36.0 - 46.0 %   MCV 88.5 78.0 - 100.0 fL   MCH 29.1 26.0 - 34.0 pg   MCHC 32.9 30.0 - 36.0 g/dL   RDW 20.9 (H) 11.5 - 15.5 %   Platelets 245 150 - 400 K/uL  Heparin level (unfractionated)     Status: None   Collection Time: 07/21/16  5:33 AM  Result Value Ref Range   Heparin Unfractionated 0.42 0.30 - 0.70 IU/mL    Comment:        IF HEPARIN RESULTS ARE BELOW EXPECTED VALUES, AND PATIENT DOSAGE HAS BEEN CONFIRMED, SUGGEST FOLLOW  UP TESTING OF ANTITHROMBIN III LEVELS.   Protime-INR     Status: None   Collection Time: 07/21/16  5:33 AM  Result Value Ref Range   Prothrombin  Time 15.2 11.4 - 15.2 seconds   INR 1.20   Glucose, capillary     Status: None   Collection Time: 07/21/16  9:10 AM  Result Value Ref Range   Glucose-Capillary 96 65 - 99 mg/dL  Glucose, capillary     Status: None   Collection Time: 07/21/16 12:23 PM  Result Value Ref Range   Glucose-Capillary 69 65 - 99 mg/dL  Glucose, capillary     Status: Abnormal   Collection Time: 07/21/16  4:16 PM  Result Value Ref Range   Glucose-Capillary 175 (H) 65 - 99 mg/dL  Glucose, capillary     Status: Abnormal   Collection Time: 07/21/16  9:13 PM  Result Value Ref Range   Glucose-Capillary 162 (H) 65 - 99 mg/dL   Comment 1 Notify RN   Heparin level (unfractionated)     Status: None   Collection Time: 07/21/16 10:35 PM  Result Value Ref Range   Heparin Unfractionated 0.30 0.30 - 0.70 IU/mL    Comment:        IF HEPARIN RESULTS ARE BELOW EXPECTED VALUES, AND PATIENT DOSAGE HAS BEEN CONFIRMED, SUGGEST FOLLOW UP TESTING OF ANTITHROMBIN III LEVELS.   Protime-INR     Status: None   Collection Time: 07/22/16  4:42 AM  Result Value Ref Range   Prothrombin Time 15.0 11.4 - 15.2 seconds   INR 1.18   CBC     Status: Abnormal   Collection Time: 07/22/16  4:42 AM  Result Value Ref Range   WBC 7.3 4.0 - 10.5 K/uL   RBC 5.63 (H) 3.87 - 5.11 MIL/uL   Hemoglobin 17.2 (H) 12.0 - 15.0 g/dL    Comment: DELTA CHECK NOTED REPEATED TO VERIFY    HCT 50.8 (H) 36.0 - 46.0 %   MCV 90.2 78.0 - 100.0 fL   MCH 30.6 26.0 - 34.0 pg   MCHC 33.9 30.0 - 36.0 g/dL   RDW 21.4 (H) 11.5 - 15.5 %   Platelets 88 (L) 150 - 400 K/uL    Comment: DELTA CHECK NOTED SPECIMEN CHECKED FOR CLOTS REPEATED TO VERIFY PLATELET COUNT CONFIRMED BY SMEAR   Heparin level (unfractionated)     Status: None   Collection Time: 07/22/16  4:42 AM  Result Value Ref Range   Heparin Unfractionated 0.44 0.30 - 0.70 IU/mL    Comment:        IF HEPARIN RESULTS ARE BELOW EXPECTED VALUES, AND PATIENT DOSAGE HAS BEEN CONFIRMED, SUGGEST FOLLOW  UP TESTING OF ANTITHROMBIN III LEVELS.   Glucose, capillary     Status: Abnormal   Collection Time: 07/22/16  6:23 AM  Result Value Ref Range   Glucose-Capillary 114 (H) 65 - 99 mg/dL   Comment 1 Notify RN      HEENT: normocephalic and atraumatic.  Oral: poor dentition Cardio: RRR without murmur. No JVD . Resp:CTA Bilaterally without wheezes or rales. Normal effort . GI: BS positive, nondistended Musc:  Edema R BKA with tenderness still, well shaped. Healed left BKA. Can extend right knee fully Neuro: Alert/Oriented Motor 4+/5 in BUE  LLE: HF, KE 4/5 RLE: HF 3/5, KE 3/5 (continued pain inhibition)  Gen NAD. Well developed Skin:   R BKA incision with dressing c/d/i---left AVG site clean and intact also  Assessment/Plan: 1. Functional deficits secondary to [place]  right and recent left BKA which require 3+ hours per day of interdisciplinary therapy in a comprehensive inpatient rehab setting. Physiatrist is providing close team supervision and 24 hour management of active medical problems listed below. Physiatrist and rehab team continue to assess barriers to discharge/monitor patient progress toward functional and medical goals. FIM: Function - Bathing Position: Sitting EOB Body parts bathed by patient: Right arm, Left arm, Chest, Abdomen, Front perineal area, Buttocks, Right upper leg, Left upper leg Body parts bathed by helper: Back Bathing not applicable: Left lower leg, Right lower leg Assist Level: Supervision or verbal cues  Function- Upper Body Dressing/Undressing What is the patient wearing?: Pull over shirt/dress Pull over shirt/dress - Perfomed by patient: Thread/unthread right sleeve, Thread/unthread left sleeve, Put head through opening, Pull shirt over trunk Assist Level: Set up Set up : To obtain clothing/put away Function - Lower Body Dressing/Undressing What is the patient wearing?: Underwear, Pants Position: Sitting EOB Underwear - Performed by patient:  Thread/unthread right underwear leg, Thread/unthread left underwear leg, Pull underwear up/down Pants- Performed by patient: Thread/unthread right pants leg, Thread/unthread left pants leg, Pull pants up/down Pants- Performed by helper: Pull pants up/down Assist for lower body dressing: Supervision or verbal cues  Function - Toileting Toileting activity did not occur: No continent bowel/bladder event Toileting steps completed by patient: Adjust clothing prior to toileting, Performs perineal hygiene, Adjust clothing after toileting Toileting steps completed by helper: Adjust clothing prior to toileting, Adjust clothing after toileting Toileting Assistive Devices: Grab bar or rail Assist level: Supervision or verbal cues  Function - Air cabin crew transfer activity did not occur:  (not attempted) Toilet transfer assistive device: Bedside commode Assist level to toilet: Maximal assist (Pt 25 - 49%/lift and lower) Assist level from toilet: Moderate assist (Pt 50 - 74%/lift or lower) Assist level to bedside commode (at bedside): Moderate assist (Pt 50 - 74%/lift or lower) Assist level from bedside commode (at bedside): Supervision or verbal cues  Function - Chair/bed transfer Chair/bed transfer activity did not occur: N/A Chair/bed transfer method: Lateral scoot Chair/bed transfer assist level: Touching or steadying assistance (Pt > 75%) Chair/bed transfer assistive device: Sliding board, Armrests  Function - Locomotion: Wheelchair Type: Manual Max wheelchair distance: 180f  Assist Level: Supervision or verbal cues Assist Level: Supervision or verbal cues Assist Level: Supervision or verbal cues Turns around,maneuvers to table,bed, and toilet,negotiates 3% grade,maneuvers on rugs and over doorsills: No Function - Locomotion: Ambulation Ambulation activity did not occur: Safety/medical concerns Walk 10 feet activity did not occur: Safety/medical concerns Walk 50 feet with 2  turns activity did not occur: Safety/medical concerns Walk 150 feet activity did not occur: Safety/medical concerns Walk 10 feet on uneven surfaces activity did not occur: Safety/medical concerns  Function - Comprehension Comprehension: Auditory Comprehension assist level: Understands complex 90% of the time/cues 10% of the time  Function - Expression Expression: Verbal Expression assist level: Expresses complex 90% of the time/cues < 10% of the time  Function - Social Interaction Social Interaction assist level: Interacts appropriately 90% of the time - Needs monitoring or encouragement for participation or interaction.  Function - Problem Solving Problem solving assist level: Solves basic 90% of the time/requires cueing < 10% of the time  Function - Memory Memory assist level: Recognizes or recalls 90% of the time/requires cueing < 10% of the time Patient normally able to recall (first 3 days only): Current season, That he or she is in a hospital, Staff names and faces  Medical Problem List and Plan:  1. 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   Cont CIR   Pt needs to use KI for RLE.   Pt s/p AV graft without issues 2. DVT Prophylaxis/Anticoagulation: heparin to Coumadin therapy for history of mechanical aortic valve  3. Pain Management: Gabapentin/TENS for phantom pain,   -discussed proper wrapping of limb, massage and desensitization also 4. Mood: Provide emotional support  5. Neuropsych: This patient is capable of making decisions on her own behalf.  6. Skin/Wound Care: Dry dressing/ACE to right BK . Drainage decreasing. Local care to left BKA---almost healed  7. Fluids/Electrolytes/Nutrition: Routine I&Os  8. ESRD. Continue dialysis as directed. HD to occur at the end of the day to allow for participation in rehab activities per Nephro. Needed HD again today due to complains of SOB with fluid overload 9.  Leukocytosis.   WBCs 10.7 on 6/10  Cont to monitor 10. Acute on chronic anemia. Continue Aranesp.   Hb 7.0 on 6/10, transfused to 2units PRBC per Nephro---17.2 today??  Cont to monitor 11. Hypertension. Lopressor 25 mg twice a day. Monitor with increased mobility   Controlled   12. CAD/AVR.   Coumadin/heparin prior to procedure 14. Diabetes mellitus of peripheral neuropathy. Hemoglobin A1c 5.7.Bouts of hypoglycemia with sliding scale discontinued  contol is fair, became hypoglycemic on glimipride  Fair control 15. Hypothyroidism. Synthroid  17. SOB   CXR reviewed, showing pleural effusion  Volume mgt per renal  LOS (Days) 12 A FACE TO FACE EVALUATION WAS PERFORMED  SWARTZ,ZACHARY T 07/22/2016, 8:58 AM

## 2016-07-22 NOTE — Plan of Care (Signed)
Problem: RH SKIN INTEGRITY Goal: RH STG SKIN FREE OF INFECTION/BREAKDOWN Skin to remain free from infection and breakdown while on rehab with min assist from staff.  Outcome: Not Progressing Buttocks area increased breakdown requires allevyn foam dressing  Goal: RH STG ABLE TO PERFORM INCISION/WOUND CARE W/ASSISTANCE Patient and family will demonstrate how To Perform Incisional, Wound, and stump care With moderate Assistance from rehab staff.   Outcome: Progressing Encouraged patient;s daughter to participate

## 2016-07-22 NOTE — Discharge Summary (Signed)
[name], [name]                ACCOUNT NO.:  [id number]  MEDICAL RECORD NO.:  [id number]  LOCATION:  4W23C                        FACILITY:  [facility]  PHYSICIAN:  Lauraine Rinne, P.A.  DATE OF BIRTH:  09-03-1955  DATE OF ADMISSION:  07/10/2016 DATE OF DISCHARGE:  07/24/2016                              DISCHARGE SUMMARY   DISCHARGE DIAGNOSES: 1. 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 2. Chronic Coumadin for history of mechanical aortic valve, pain     management. 3. End-stage renal disease with hemodialysis, status post AV graft     placement on July 21, 2016. 4. Acute on chronic anemia. 5. Hypertension. 6. Coronary artery disease with aortic valve replacement. 7. Diabetes mellitus. 8. Peripheral neuropathy. 9. Hypothyroidism.  HISTORY OF PRESENT ILLNESS:  This is a 61 year old right-handed female with hypertension, diabetes mellitus, mechanical aortic valve on chronic Coumadin, history of transplant as well as left BKA in March, 2018 of which she received inpatient rehab services for.  She lives with her daughter, essentially wheelchair-bound prior to admission.  She had not received a prosthesis yet for prior BKA.  Presented on Jun 30, 2016, with gangrenous right heel right great toe, findings of arterial occlusive disease.  No change with conservative care and underwent right BKA on Jul 08, 2016, per Dr. [name].  Hospital course, pain management.  [employer] consulted for fitting of limb guard.  Acute on chronic anemia 9.4 and monitored.  Leukocytosis 20,600-23,000. Maintained on intravenous heparin, chronic Coumadin resume, hemodialysis as per Renal Services.  Physical and occupational therapy ongoing.  The patient was admitted for a comprehensive rehab program.  PAST MEDICAL HISTORY:  See discharge diagnoses.  SOCIAL HISTORY:  She lives with daughter, 1 level home.  FUNCTIONAL  STATUS:  Upon admission to rehab services was max assist supine-to-sit, moderate assist sit-to-supine, total assist anterior- posterior transfers, max total assist activities of daily living.  PHYSICAL EXAMINATION:  VITAL SIGNS:  Blood pressure 114/54, pulse 78, temperature 98, and respirations 18. GENERAL:  This was an alert female, in no acute distress. HEENT:  EOMs intact. NECK:  Supple.  Nontender.  No JVD. CARDIAC:  Rate controlled. ABDOMEN:  Soft and nontender.  Good bowel sounds. LUNGS:  Clear to auscultation without wheeze. EXTREMITIES:  Right BKA site was dressed, appropriately tender, well approximated, recent left BKA with a small amount of drainage at the incision line.  No odor.  REHABILITATION HOSPITAL COURSE:  The patient was admitted to inpatient rehab services with therapies initiated on a 3-hour daily basis, consisting of physical therapy, occupational therapy, and rehabilitation nursing.  The following issues were addressed during the patient's rehabilitation stay.  Pertaining to [name]' right BKA on Jul 08, 2016, surgical site healing nicely.  She would follow up with Vascular Surgery as well as noted history of left BKA in March, 2018.  She remained on chronic Coumadin for history of valve replacement with Lovenox bridging until INR therapeutic at 2.5-3.5.  She would be followed by [facility].  Hemodialysis ongoing as per Renal Services.  Placement of AV graft  on July 21, 2016.  Acute on chronic anemia.  Remained on iron supplement.  Latest hemoglobin 11.5.  Recently transfused.  Blood pressures monitored, no orthostatic changes and she remained on Lopressor.  Hormone supplement for hypothyroidism.  Pain management, the use of Neurontin as well as oxycodone as needed.  The patient received weekly collaborative interdisciplinary team conferences to discuss estimated length of stay, family teaching, any barriers to her discharge.   Working with energy conservation techniques, demonstrates steady progress with therapy goals.  Intermittent minimal supervision assist with bed mobility, minimal assist required for sliding board transfers for sit up, some cuing.  Close monitoring of safety awareness needing some extra time to complete tasks.  Activities of daily living and homemaking needing assistance for lower body dressing.  Working with transfers wheelchair to bed and bed to bedside commode.  Full family teaching was completed and planned discharge to home.  DISCHARGE MEDICATIONS: 1. Aspirin 81 mg p.o. daily. 2. Rocaltrol 0.5 mcg p.o. on Monday, Wednesday, and Friday. 3. PhosLo 667 mg p.o. t.i.d. 4. Aranesp weekly with dialysis. 5. Colace 100 mg p.o. daily. 6. Neurontin 300 mg p.o. at bedtime. 7. Synthroid 175 mcg p.o. breakfast. 8. Reglan 5 mg p.o. b.i.d. 9. Lopressor 12.5 mg p.o. b.i.d. 10.Rena-Vite 1 tab at bedtime. 11.MiraLAX twice daily. 12.Prograf 2 mg p.o. b.i.d. 13.Coumadin as directed with latest dose of 2.5 mg, adjusted accordingly     for INR of 2.5-3.5. 14.Oxycodone 10 mg every 4 hours as needed, pain. 15.Robaxin 250 mg p.o. every 6 hours as needed, muscle spasms. 16. Lovenox 50 mg daily until INR therapeutic   FOLLOWUP:  The patient would follow up with: 1. Dr. Alysia Penna at the outpatient rehab center as directed. 2. Dr. [name], call for appointment. 3. Dr. Shelva Majestic in 2 weeks, call for appointment. 4. Dr. Kennith Maes, [place], [facility].  SPECIAL INSTRUCTIONS:  Continue dialysis as directed.  Home health nurse to check INR on July 28, 2016, results to [facility], [facility], [phone], fax number [phone].     Lauraine Rinne, P.A.     DA/MEDQ  D:  07/22/2016  T:  07/22/2016  Job:  [id number]  cc:   Charlett Blake, M.D. Shelva Majestic, M.D. Judeth Cornfield. Scot Dock, M.D. Kennith Maes, M.D.

## 2016-07-22 NOTE — Progress Notes (Addendum)
ANTICOAGULATION CONSULT NOTE - Follow Up Consult  Pharmacy Consult for heparin and coumadin Indication: hx of mechanical AVR  Allergies  Allergen Reactions  . Acetaminophen Other (See Comments)    Liver transplant recipient   . Codeine Itching  . Mirtazapine Other (See Comments)    hallucination    Patient Measurements: Height: 5\' 4"  (162.6 cm) Weight: 116 lb (52.6 kg) IBW/kg (Calculated) : 54.7 Heparin Dosing Weight:   Vital Signs: Temp: 98.5 F (36.9 C) (06/12 0445) Temp Source: Oral (06/12 0445) BP: 120/60 (06/12 0822) Pulse Rate: 76 (06/12 0822)  Labs:  Recent Labs  07/20/16 0507  07/20/16 1441  07/21/16 0533 07/21/16 2235 07/22/16 0442  HGB 7.0*  --   --   --  11.5*  --  17.2*  HCT 22.5*  --   --   --  34.7*  --  50.8*  PLT 284  --   --   --  245  --  88*  LABPROT 20.3*  --   --   --  15.2  --  15.0  INR 1.71  --   --   --  1.20  --  1.18  HEPARINUNFRC >2.20*  < >  --   < > 0.42 0.30 0.44  CREATININE  --   --  2.86*  --   --   --   --   < > = values in this interval not displayed.  Estimated Creatinine Clearance: 17.2 mL/min (A) (by C-G formula based on SCr of 2.86 mg/dL (H)).   Medications:  Scheduled:  . aspirin EC  81 mg Oral Daily  . calcitRIOL  0.5 mcg Oral Q M,W,F-HD  . calcium acetate  667 mg Oral TID WC  . darbepoetin (ARANESP) injection - DIALYSIS  200 mcg Intravenous Q Mon-HD  . docusate sodium  100 mg Oral Daily  . feeding supplement  1 Container Oral TID BM  . feeding supplement (PRO-STAT SUGAR FREE 64)  30 mL Oral BID  . gabapentin  300 mg Oral QHS  . levothyroxine  175 mcg Oral QAC breakfast  . metoCLOPramide  5 mg Oral BID AC  . metoprolol tartrate  12.5 mg Oral BID  . multivitamin  1 tablet Oral QHS  . polyethylene glycol  17 g Oral BID  . senna-docusate  1 tablet Oral BID  . tacrolimus  2 mg Oral BID  . warfarin  2.5 mg Oral ONCE-1800  . Warfarin - Pharmacist Dosing Inpatient   Does not apply q1800   Infusions:  . ferric  gluconate (FERRLECIT/NULECIT) IV    . heparin 1,200 Units/hr (07/22/16 0300)    Assessment: 61 yo female with hx of mechanical AVR is currently on therapeutic heparin and subtherapeutic coumadin.  Heparin level is 0.44 today and INR is 1.18.  Hgb 17.2 but somehow Plt went down to 88 K??  Repeat Hgb 12.5 and Plt 189 K Lanijah Warzecha suspect that the 1st CBC was erroneous  Goal of Therapy:  Heparin level 0.3-0.7 units/ml; INR 2.5 - 3.5 Monitor platelets by anticoagulation protocol: Yes   Plan:  - Continue heparin at 1200 units/hr - coumadin 2.5 mg po x1 - INR in am; daily heparin level and CBC  Shuree Brossart, Tsz-Yin 07/22/2016,8:25 AM

## 2016-07-22 NOTE — Progress Notes (Signed)
Late entry. Patient reported left fingers numb and tingling. Left hand and fingers cool to touch . Warmed left hand and patient reported numbness and tingling gone. + briut /thrill left arm. Weak radial pulse.D. [place].PA aware. Continue with plan of care. Mliss Sax

## 2016-07-22 NOTE — Discharge Summary (Signed)
Discharge summary job [id number]

## 2016-07-22 NOTE — Progress Notes (Signed)
[facility] Progress Note   Subjective:   Stabel, no further SOB.  Had new L UA AVG placed yesterday.  INR still low.   Objective Vitals:   07/21/16 1150 07/21/16 1500 07/22/16 0445 07/22/16 0822  BP:  97/60 (!) 114/59 120/60  Pulse:  83 74 76  Resp:  16 16   Temp:  98.9 F (37.2 C) 98.5 F (36.9 C)   TempSrc:  Oral Oral   SpO2:  96% 99%   Weight: 49.4 kg (109 lb)  52.6 kg (116 lb)   Height:       Physical Exam General: Heart: Lungs: Abdomen: Extremities: Dialysis Access:   Dialysis Orders:  Additional Objective Labs: Basic Metabolic Panel:  Recent Labs Lab 07/16/16 1553 07/17/16 0603 07/18/16 1630 07/20/16 1441  NA 128* 132* 132* 135  K 3.5 3.5 3.4* 3.5  CL 93* 96* 96* 100*  CO2 24 26 26 26   GLUCOSE 288* 134* 262* 163*  BUN 17 7 14  5*  CREATININE 5.56* 3.10* 4.61* 2.86*  CALCIUM 9.0 8.6* 8.5* 8.8*  PHOS 6.5*  --  5.9* 3.9   Liver Function Tests:  Recent Labs Lab 07/16/16 1553 07/18/16 1630 07/20/16 1441  ALBUMIN 1.7* 1.6* 1.7*   No results for input(s): LIPASE, AMYLASE in the last 168 hours. CBC:  Recent Labs Lab 07/19/16 0458 07/20/16 0507 07/21/16 0533 07/22/16 0442 07/22/16 0940  WBC 11.7* 10.7* 13.1* 7.3 11.7*  HGB 8.4* 7.0* 11.5* 17.2* 12.5  HCT 26.0* 22.5* 34.7* 50.8* 37.8  MCV 92.9 92.6 88.5 90.2 91.1  PLT 294 284 245 88* 189   Blood Culture    Component Value Date/Time   SDES BLOOD RIGHT HAND 06/30/2016 2247   SPECREQUEST  06/30/2016 2247    BOTTLES DRAWN AEROBIC AND ANAEROBIC Blood Culture adequate volume   CULT NO GROWTH 5 DAYS 06/30/2016 2247   REPTSTATUS 07/06/2016 FINAL 06/30/2016 2247    Cardiac Enzymes: No results for input(s): CKTOTAL, CKMB, CKMBINDEX, TROPONINI in the last 168 hours. CBG:  Recent Labs Lab 07/21/16 1223 07/21/16 1616 07/21/16 2113 07/22/16 0623 07/22/16 1204  GLUCAP 69 175* 162* 114* 122*   Iron Studies: No results for input(s): IRON, TIBC, TRANSFERRIN, FERRITIN in the  last 72 hours. @lablastinr3 @ Studies/Results: No results found. Medications: . ferric gluconate (FERRLECIT/NULECIT) IV    . heparin 1,200 Units/hr (07/22/16 0300)   . aspirin EC  81 mg Oral Daily  . calcitRIOL  0.5 mcg Oral Q M,W,F-HD  . calcium acetate  667 mg Oral TID WC  . darbepoetin (ARANESP) injection - DIALYSIS  200 mcg Intravenous Q Mon-HD  . docusate sodium  100 mg Oral Daily  . feeding supplement  1 Container Oral TID BM  . feeding supplement (PRO-STAT SUGAR FREE 64)  30 mL Oral BID  . gabapentin  300 mg Oral QHS  . levothyroxine  175 mcg Oral QAC breakfast  . metoCLOPramide  5 mg Oral BID AC  . metoprolol tartrate  12.5 mg Oral BID  . multivitamin  1 tablet Oral QHS  . polyethylene glycol  17 g Oral BID  . senna-docusate  1 tablet Oral BID  . tacrolimus  2 mg Oral BID  . warfarin  2.5 mg Oral ONCE-1800  . Warfarin - Pharmacist Dosing Inpatient   Does not apply q1800   Dialysis:  [name] MWF 3.5h  R IJ cath    50kg   Hep none  2/2 bath   -Calcitriol 0.5 mcg PO q HD -Mircera 50 mcg  IV q 4 weeks   Assess: 1. Gangrene R foot/S/P R BKA : On on rehab.  2. Dyspnea/ vol overload: improved w extra HD 3. ESRD - next HD tomorrow, MWF 4. Anemia - HGB down to 7.0 rec'd 2 units PRBCs 07/20/16. HGB 11.5 today. follow HGB. ESA ordered.  5. Secondary hyperparathyroidism - continue binders/VDRA. Phos 3.9 Ca 8.8 6. HTN/volume - BP soft. May need to lower metoprolol dose 7. Nutrition - Albumin 1.7. Severe PCM. Renal diet, boost/ add prostat.  8. 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  9. H/O liver transplant. Cont immunosuppressant therapy.  10. DM; Per primary  P - as above  [name] MD [name] pgr [phone]   07/22/2016, 12:34 PM

## 2016-07-22 NOTE — Progress Notes (Signed)
   VASCULAR SURGERY ASSESSMENT & PLAN:   POD 14 S/P Right BKA. This is healing nicely.  Staples out in 2 weeks.  I have encouraged her to keep the knee straight so that she does not get a contracture.   SUBJECTIVE:   No complaints.   PHYSICAL EXAM:   Vitals:   07/21/16 1500 07/22/16 0445 07/22/16 0822 07/22/16 1507  BP: 97/60 (!) 114/59 120/60 (!) 106/50  Pulse: 83 74 76 66  Resp: 16 16  17   Temp: 98.9 F (37.2 C) 98.5 F (36.9 C)  97.4 F (36.3 C)  TempSrc: Oral Oral  Oral  SpO2: 96% 99%  94%  Weight:  116 lb (52.6 kg)    Height:       Right BKA inspected and looks good. The wound on the old left AKA has healed.  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 aspirin EC  81 mg Oral Daily  . calcitRIOL  0.5 mcg Oral Q M,W,F-HD  . calcium acetate  667 mg Oral TID WC  . darbepoetin (ARANESP) injection - DIALYSIS  200 mcg Intravenous Q Mon-HD  . docusate sodium  100 mg Oral Daily  . feeding supplement  1 Container Oral TID BM  . feeding supplement (PRO-STAT SUGAR FREE 64)  30 mL Oral BID  . gabapentin  300 mg Oral QHS  . levothyroxine  175 mcg  Oral QAC breakfast  . metoCLOPramide  5 mg Oral BID AC  . metoprolol tartrate  12.5 mg Oral BID  . multivitamin  1 tablet Oral QHS  . polyethylene glycol  17 g Oral BID  . senna-docusate  1 tablet Oral BID  . tacrolimus  2 mg Oral BID  . warfarin  2.5 mg Oral ONCE-1800  . Warfarin - Pharmacist Dosing Inpatient   Does not apply [place]: [phone] Office: [phone] 07/22/2016

## 2016-07-22 NOTE — Progress Notes (Signed)
Patient reported she vomited during therapy session. Patient reports she vomited last night as well. Patient reports she feels like she has heartburn and then vomits. Denied need for zofran at this time. Continue to monitor.  LBM 6-9.  Mliss Sax

## 2016-07-22 NOTE — Progress Notes (Signed)
Physical Therapy Session Note  Patient Details  Name: [name] MRN: [id number] Date of Birth: 1955-05-10  Today's Date: 07/22/2016 PT Individual Time: 0820-0918 PT Individual Time Calculation (min): 58 min    Skilled Therapeutic Interventions/Progress Updates:    Session initiated with pt lying supine in bed.  Pt rating pain as 8/10.  Nursing present to issue meds.  Session focused on education and improving independence with functional mobility for hopeful D/C home.  Pt transferred bed to chair with min A and sliding board, performed rolling and supine to sit with supervision.  Pt propelled w/c over level surfaces only with supervision.  Per [name], PA-C, pt not to propel up ramp currently due to recent shunt placement.  Pt  Transferred to car with sliding board with min assist with v/c for NWB with L LE.  Following session, pt left up in chair with needs met and call bell in reach.  Therapy Documentation Precautions:  Precautions Precautions: Fall Restrictions Weight Bearing Restrictions: Yes RLE Weight Bearing: Non weight bearing LLE Weight Bearing: Non weight bearing   See Function Navigator for Current Functional Status.   Therapy/Group: Individual Therapy  [name] 07/22/2016, 11:11 AM

## 2016-07-23 ENCOUNTER — Inpatient Hospital Stay (HOSPITAL_COMMUNITY): Payer: Medicare Other | Admitting: Physical Therapy

## 2016-07-23 ENCOUNTER — Inpatient Hospital Stay (HOSPITAL_COMMUNITY): Payer: Medicare Other | Admitting: Occupational Therapy

## 2016-07-23 ENCOUNTER — Telehealth: Payer: Self-pay | Admitting: Vascular Surgery

## 2016-07-23 LAB — CBC
HEMATOCRIT: 31.9 % — AB (ref 36.0–46.0)
HEMOGLOBIN: 10.3 g/dL — AB (ref 12.0–15.0)
MCH: 28.9 pg (ref 26.0–34.0)
MCHC: 32.3 g/dL (ref 30.0–36.0)
MCV: 89.4 fL (ref 78.0–100.0)
PLATELETS: 248 10*3/uL (ref 150–400)
RBC: 3.57 MIL/uL — AB (ref 3.87–5.11)
RDW: 20.1 % — ABNORMAL HIGH (ref 11.5–15.5)
WBC: 11.4 10*3/uL — ABNORMAL HIGH (ref 4.0–10.5)

## 2016-07-23 LAB — PROTIME-INR
INR: 1.26
Prothrombin Time: 15.8 seconds — ABNORMAL HIGH (ref 11.4–15.2)

## 2016-07-23 LAB — RENAL FUNCTION PANEL
ALBUMIN: 2.1 g/dL — AB (ref 3.5–5.0)
Anion gap: 11 (ref 5–15)
BUN: 9 mg/dL (ref 6–20)
CALCIUM: 8.9 mg/dL (ref 8.9–10.3)
CO2: 26 mmol/L (ref 22–32)
CREATININE: 3.08 mg/dL — AB (ref 0.44–1.00)
Chloride: 97 mmol/L — ABNORMAL LOW (ref 101–111)
GFR calc Af Amer: 18 mL/min — ABNORMAL LOW (ref >60)
GFR, EST NON AFRICAN AMERICAN: 15 mL/min — AB (ref >60)
GLUCOSE: 138 mg/dL — AB (ref 65–99)
PHOSPHORUS: 3.5 mg/dL (ref 2.5–4.6)
Potassium: 4 mmol/L (ref 3.5–5.1)
SODIUM: 134 mmol/L — AB (ref 135–145)

## 2016-07-23 LAB — GLUCOSE, CAPILLARY
GLUCOSE-CAPILLARY: 127 mg/dL — AB (ref 65–99)
GLUCOSE-CAPILLARY: 175 mg/dL — AB (ref 65–99)
Glucose-Capillary: 120 mg/dL — ABNORMAL HIGH (ref 65–99)

## 2016-07-23 LAB — HEPARIN LEVEL (UNFRACTIONATED): Heparin Unfractionated: 0.49 [IU]/mL (ref 0.30–0.70)

## 2016-07-23 MED ORDER — OXYCODONE HCL 5 MG PO TABS
ORAL_TABLET | ORAL | Status: AC
  Administered 2016-07-23: 10 mg via ORAL
  Filled 2016-07-23: qty 2

## 2016-07-23 MED ORDER — CALCITRIOL 0.5 MCG PO CAPS
ORAL_CAPSULE | ORAL | Status: AC
Start: 2016-07-23 — End: 2016-07-24
  Filled 2016-07-23: qty 1

## 2016-07-23 MED ORDER — LIDOCAINE-PRILOCAINE 2.5-2.5 % EX CREA: 1.0000 "application " | TOPICAL_CREAM | CUTANEOUS | Status: DC | PRN

## 2016-07-23 MED ORDER — ENOXAPARIN SODIUM 60 MG/0.6ML ~~LOC~~ SOLN
1.0000 mg/kg | SUBCUTANEOUS | Status: DC
  Administered 2016-07-23 – 2016-07-24 (×2): 50 mg via SUBCUTANEOUS
  Filled 2016-07-23 (×2): qty 0.6

## 2016-07-23 MED ORDER — LIDOCAINE HCL (PF) 1 % IJ SOLN: 5.0000 mL | INTRAMUSCULAR | Status: DC | PRN

## 2016-07-23 MED ORDER — WARFARIN SODIUM 2.5 MG PO TABS
2.5000 mg | ORAL_TABLET | Freq: Once | ORAL | Status: AC
  Administered 2016-07-23: 2.5 mg via ORAL
  Filled 2016-07-23: qty 1

## 2016-07-23 MED ORDER — SODIUM CHLORIDE 0.9 % IV SOLN: 100.0000 mL | INTRAVENOUS | Status: DC | PRN

## 2016-07-23 MED ORDER — ALTEPLASE 2 MG IJ SOLR: 2.0000 mg | Freq: Once | INTRAMUSCULAR | Status: DC | PRN

## 2016-07-23 MED ORDER — PENTAFLUOROPROP-TETRAFLUOROETH EX AERO: 1.0000 "application " | INHALATION_SPRAY | CUTANEOUS | Status: DC | PRN

## 2016-07-23 MED ORDER — HEPARIN SODIUM (PORCINE) 1000 UNIT/ML DIALYSIS: 1000.0000 [IU] | INTRAMUSCULAR | Status: DC | PRN

## 2016-07-23 NOTE — Progress Notes (Signed)
Physical Therapy Discharge Summary  Patient Details  Name: [name] MRN: [id number] Date of Birth: 08-Aug-1955  Today's Date: 07/23/2016 PT Individual Time: 6712-4580    Patient has met 7 of 8 long term goals due to improved activity tolerance, improved balance, improved postural control, increased strength, increased range of motion, decreased pain and ability to compensate for deficits.  Patient to discharge at a wheelchair level Supervision.   Patient's care partner is independent to provide the necessary physical assistance at discharge.  Reasons goals not met: Pt requires supervision assist for bed mobility due to poor problem solving.   Recommendation:  Patient will benefit from ongoing skilled PT services in home health setting to continue to advance safe functional mobility, address ongoing impairments in balance, strength, safety, and minimize fall risk.  Equipment: Slide board   Reasons for discharge: treatment goals met and discharge from hospital  Patient/family agrees with progress made and goals achieved: Yes  PT Discharge Precautions/Restrictions Precautions Precautions: Fall Restrictions Weight Bearing Restrictions: Yes RLE Weight Bearing: Non weight bearing LLE Weight Bearing: Non weight bearing Vital Signs Therapy Vitals Temp: 98.2 F (36.8 C) Temp Source: Oral Pulse Rate: 70 Resp: 15 BP: (!) 90/45 Patient Position (if appropriate): Lying Oxygen Therapy SpO2: 98 % O2 Device: Not Delivered Pain Pain Assessment Pain Assessment: No/denies pain Pain Score: 0-No pain Faces Pain Scale: No hurt Vision/Perception  Perception Perception: Within Functional Limits Praxis Praxis: Intact  Cognition Overall Cognitive Status: Impaired/Different from baseline Arousal/Alertness: Awake/alert Orientation Level: Oriented X4 Memory: Impaired Memory Impairment: Retrieval deficit;Decreased recall of new information Awareness: Impaired Awareness Impairment:  Anticipatory impairment Problem Solving: Impaired Problem Solving Impairment: Functional complex;Verbal complex Safety/Judgment: Appears intact Sensation Sensation Light Touch: Appears Intact Hot/Cold: Appears Intact Proprioception: Appears Intact Coordination Gross Motor Movements are Fluid and Coordinated: Yes Fine Motor Movements are Fluid and Coordinated: Yes Motor  Motor Motor: Other (comment) Motor - Discharge Observations: generalized weakness. improved since admission  Mobility Bed Mobility Bed Mobility: Rolling Right;Rolling Left;Supine to Sit;Sit to Supine Rolling Right: 5: Supervision Rolling Left: 5: Supervision Rolling Left Details: Verbal cues for precautions/safety Supine to Sit: 5: Supervision Supine to Sit Details: Verbal cues for precautions/safety Sit to Supine: 5: Supervision Transfers Transfers: Yes Lateral/Scoot Transfers: 5: Supervision Locomotion  Ambulation Ambulation: No Gait Gait: No Stairs / Additional Locomotion Ramp: 4: Min Chemical engineer: Yes Wheelchair Assistance: 5: Investment banker, operational Details: Verbal cues for precautions/safety;Verbal cues for safe use of DME/AE Wheelchair Propulsion: Both upper extremities Wheelchair Parts Management: Supervision/cueing  Trunk/Postural Assessment  Cervical Assessment Cervical Assessment: Within Scientist, physiological Assessment: Exceptions to [facility] (Kyphotic) Lumbar Assessment Lumbar Assessment: Exceptions to [facility] (Posterior pelvic tilt) Postural Control Postural Control: Within Functional Limits  Balance Balance Balance Assessed: Yes Dynamic Sitting Balance Dynamic Sitting - Balance Support: During functional activity Dynamic Sitting - Level of Assistance: 6: Modified independent (Device/Increase time) Extremity Assessment      RLE Assessment RLE Assessment: Within Functional Limits RLE AROM (degrees) RLE Overall AROM  Comments: WFL Right Knee Extension: -3 RLE Strength RLE Overall Strength Comments: 4/5 to 4+/5 hip and knee LLE Assessment LLE Assessment: Within Functional Limits (hip and knee. BKA)   See Function Navigator for Current Functional Status.  [name] 07/23/2016, 5:59 PM

## 2016-07-23 NOTE — Telephone Encounter (Signed)
-----   Message from Mena Goes, RN sent at 07/21/2016  9:22 AM EDT ----- Regarding: 6 weeks   ----- Message ----- From: Conrad Ross, MD Sent: 07/21/2016   8:51 AM To: [facility] [address]  KAYDIN LABO [id number] 15-Sep-1955  PROCEDURE: left first stage brachial vein transposition (brachiobrachial arteriovenous fistula) placement  Follow-up: 6 weeks in office

## 2016-07-23 NOTE — Progress Notes (Addendum)
ANTICOAGULATION CONSULT NOTE - Follow Up Consult  Pharmacy Consult for heparin and coumadin Indication: hx of mechanical AVR  Allergies  Allergen Reactions  . Acetaminophen Other (See Comments)    Liver transplant recipient   . Codeine Itching  . Mirtazapine Other (See Comments)    hallucination    Patient Measurements: Height: 5\' 4"  (162.6 cm) Weight: 111 lb 1.8 oz (50.4 kg) IBW/kg (Calculated) : 54.7 Heparin Dosing Weight:   Vital Signs: Temp: 98.4 F (36.9 C) (06/13 0355) Temp Source: Oral (06/13 0355) BP: 104/57 (06/13 0355) Pulse Rate: 52 (06/13 0355)  Labs:  Recent Labs  07/20/16 1441  07/21/16 0533 07/21/16 2235 07/22/16 0442 07/22/16 0940 07/23/16 0536  HGB  --   < > 11.5*  --  17.2* 12.5 10.3*  HCT  --   < > 34.7*  --  50.8* 37.8 31.9*  PLT  --   < > 245  --  88* 189 248  LABPROT  --   --  15.2  --  15.0  --  15.8*  INR  --   --  1.20  --  1.18  --  1.26  HEPARINUNFRC  --   < > 0.42 0.30 0.44  --  0.49  CREATININE 2.86*  --   --   --   --   --   --   < > = values in this interval not displayed.  Estimated Creatinine Clearance: 16.4 mL/min (A) (by C-G formula based on SCr of 2.86 mg/dL (H)).   Medications:  Scheduled:  . aspirin EC  81 mg Oral Daily  . calcitRIOL  0.5 mcg Oral Q M,W,F-HD  . calcium acetate  667 mg Oral TID WC  . darbepoetin (ARANESP) injection - DIALYSIS  200 mcg Intravenous Q Mon-HD  . docusate sodium  100 mg Oral Daily  . feeding supplement  1 Container Oral TID BM  . feeding supplement (PRO-STAT SUGAR FREE 64)  30 mL Oral BID  . gabapentin  300 mg Oral QHS  . levothyroxine  175 mcg Oral QAC breakfast  . metoCLOPramide  5 mg Oral BID AC  . metoprolol tartrate  12.5 mg Oral BID  . multivitamin  1 tablet Oral QHS  . polyethylene glycol  17 g Oral BID  . senna-docusate  1 tablet Oral BID  . tacrolimus  2 mg Oral BID  . Warfarin - Pharmacist Dosing Inpatient   Does not apply q1800   Infusions:  . ferric gluconate  (FERRLECIT/NULECIT) IV    . heparin 1,200 Units/hr (07/22/16 1311)    Assessment: 61 yo female with hx of mechanical AVR is currently on therapeutic coumadin heparin and subtherapeutic coumadin.  INR is rising to 1.26.  Goal of Therapy:  Heparin level 0.3-0.7 units/ml; INR 2.5-3.5 Monitor platelets by anticoagulation protocol: Yes   Plan:  - Continue heparin at 1200 units/hr - coumadin 2.5 mg po x1 - INR in am; daily heparin level and CBC  Addendum:  Switching from heparin to lovenox.    Plan: - stop heparin drip now - start lovenox 50 mg sq q24h 1 hour after heparin is stopped - CBC every 72 hours  Taesha Goodell, Tsz-Yin 07/23/2016,8:33 AM

## 2016-07-23 NOTE — Progress Notes (Signed)
Subjective/Complaints: Overall feeling well. Anxious to get home. No arm pain. Right leg remains sore. Wearing ACE  ROS: pt denies nausea, vomiting, diarrhea, cough, shortness of breath or chest pain   Objective: Vital Signs: Blood pressure (!) 104/57, pulse (!) 52, temperature 98.4 F (36.9 C), temperature source Oral, resp. rate 18, height 5' 4"  (1.626 m), weight 50.4 kg (111 lb 1.8 oz), SpO2 96 %. No results found. Results for orders placed or performed during the hospital encounter of 07/10/16 (from the past 72 hour(s))  Glucose, capillary     Status: Abnormal   Collection Time: 07/20/16 12:01 PM  Result Value Ref Range   Glucose-Capillary 145 (H) 65 - 99 mg/dL  Prepare RBC     Status: None   Collection Time: 07/20/16 12:43 PM  Result Value Ref Range   Order Confirmation ORDER PROCESSED BY BLOOD BANK   Type and screen [place]     Status: None   Collection Time: 07/20/16 12:43 PM  Result Value Ref Range   ABO/RH(D) A POS    Antibody Screen NEG    Sample Expiration 07/23/2016    Unit Number [id number]    Blood Component Type RED CELLS,LR    Unit division 00    Status of Unit ISSUED,FINAL    Transfusion Status OK TO TRANSFUSE    Crossmatch Result Compatible    Unit Number [id number]    Blood Component Type RED CELLS,LR    Unit division 00    Status of Unit ISSUED,FINAL    Transfusion Status OK TO TRANSFUSE    Crossmatch Result Compatible   Renal function panel     Status: Abnormal   Collection Time: 07/20/16  2:41 PM  Result Value Ref Range   Sodium 135 135 - 145 mmol/L   Potassium 3.5 3.5 - 5.1 mmol/L   Chloride 100 (L) 101 - 111 mmol/L   CO2 26 22 - 32 mmol/L   Glucose, Bld 163 (H) 65 - 99 mg/dL   BUN 5 (L) 6 - 20 mg/dL   Creatinine, Ser 2.86 (H) 0.44 - 1.00 mg/dL    Comment: DELTA CHECK NOTED   Calcium 8.8 (L) 8.9 - 10.3 mg/dL   Phosphorus 3.9 2.5 - 4.6 mg/dL   Albumin 1.7 (L) 3.5 - 5.0 g/dL   GFR calc non Af Amer 17 (L) >60  mL/min   GFR calc Af Amer 19 (L) >60 mL/min    Comment: (NOTE) The eGFR has been calculated using the CKD EPI equation. This calculation has not been validated in all clinical situations. eGFR's persistently <60 mL/min signify possible Chronic Kidney Disease.    Anion gap 9 5 - 15  Glucose, capillary     Status: Abnormal   Collection Time: 07/20/16  7:01 PM  Result Value Ref Range   Glucose-Capillary 113 (H) 65 - 99 mg/dL  Heparin level (unfractionated)     Status: None   Collection Time: 07/20/16 10:06 PM  Result Value Ref Range   Heparin Unfractionated 0.57 0.30 - 0.70 IU/mL    Comment:        IF HEPARIN RESULTS ARE BELOW EXPECTED VALUES, AND PATIENT DOSAGE HAS BEEN CONFIRMED, SUGGEST FOLLOW UP TESTING OF ANTITHROMBIN III LEVELS.   Glucose, capillary     Status: Abnormal   Collection Time: 07/20/16 10:21 PM  Result Value Ref Range   Glucose-Capillary 188 (H) 65 - 99 mg/dL  Glucose, capillary     Status: Abnormal   Collection Time: 07/21/16  5:19 AM  Result Value Ref Range   Glucose-Capillary 120 (H) 65 - 99 mg/dL  CBC     Status: Abnormal   Collection Time: 07/21/16  5:33 AM  Result Value Ref Range   WBC 13.1 (H) 4.0 - 10.5 K/uL   RBC 3.92 3.87 - 5.11 MIL/uL   Hemoglobin 11.5 (L) 12.0 - 15.0 g/dL    Comment: DELTA CHECK NOTED REPEATED TO VERIFY POST TRANSFUSION SPECIMEN    HCT 34.7 (L) 36.0 - 46.0 %   MCV 88.5 78.0 - 100.0 fL   MCH 29.1 26.0 - 34.0 pg   MCHC 32.9 30.0 - 36.0 g/dL   RDW 20.9 (H) 11.5 - 15.5 %   Platelets 245 150 - 400 K/uL  Heparin level (unfractionated)     Status: None   Collection Time: 07/21/16  5:33 AM  Result Value Ref Range   Heparin Unfractionated 0.42 0.30 - 0.70 IU/mL    Comment:        IF HEPARIN RESULTS ARE BELOW EXPECTED VALUES, AND PATIENT DOSAGE HAS BEEN CONFIRMED, SUGGEST FOLLOW UP TESTING OF ANTITHROMBIN III LEVELS.   Protime-INR     Status: None   Collection Time: 07/21/16  5:33 AM  Result Value Ref Range   Prothrombin  Time 15.2 11.4 - 15.2 seconds   INR 1.20   Glucose, capillary     Status: None   Collection Time: 07/21/16  9:10 AM  Result Value Ref Range   Glucose-Capillary 96 65 - 99 mg/dL  Glucose, capillary     Status: None   Collection Time: 07/21/16 12:23 PM  Result Value Ref Range   Glucose-Capillary 69 65 - 99 mg/dL  Glucose, capillary     Status: Abnormal   Collection Time: 07/21/16  4:16 PM  Result Value Ref Range   Glucose-Capillary 175 (H) 65 - 99 mg/dL  Glucose, capillary     Status: Abnormal   Collection Time: 07/21/16  9:13 PM  Result Value Ref Range   Glucose-Capillary 162 (H) 65 - 99 mg/dL   Comment 1 Notify RN   Heparin level (unfractionated)     Status: None   Collection Time: 07/21/16 10:35 PM  Result Value Ref Range   Heparin Unfractionated 0.30 0.30 - 0.70 IU/mL    Comment:        IF HEPARIN RESULTS ARE BELOW EXPECTED VALUES, AND PATIENT DOSAGE HAS BEEN CONFIRMED, SUGGEST FOLLOW UP TESTING OF ANTITHROMBIN III LEVELS.   Protime-INR     Status: None   Collection Time: 07/22/16  4:42 AM  Result Value Ref Range   Prothrombin Time 15.0 11.4 - 15.2 seconds   INR 1.18   CBC     Status: Abnormal   Collection Time: 07/22/16  4:42 AM  Result Value Ref Range   WBC 7.3 4.0 - 10.5 K/uL   RBC 5.63 (H) 3.87 - 5.11 MIL/uL   Hemoglobin 17.2 (H) 12.0 - 15.0 g/dL    Comment: DELTA CHECK NOTED REPEATED TO VERIFY    HCT 50.8 (H) 36.0 - 46.0 %   MCV 90.2 78.0 - 100.0 fL   MCH 30.6 26.0 - 34.0 pg   MCHC 33.9 30.0 - 36.0 g/dL   RDW 21.4 (H) 11.5 - 15.5 %   Platelets 88 (L) 150 - 400 K/uL    Comment: DELTA CHECK NOTED SPECIMEN CHECKED FOR CLOTS REPEATED TO VERIFY PLATELET COUNT CONFIRMED BY SMEAR   Heparin level (unfractionated)     Status: None   Collection Time: 07/22/16  4:42 AM  Result Value Ref Range   Heparin Unfractionated 0.44 0.30 - 0.70 IU/mL    Comment:        IF HEPARIN RESULTS ARE BELOW EXPECTED VALUES, AND PATIENT DOSAGE HAS BEEN CONFIRMED, SUGGEST FOLLOW  UP TESTING OF ANTITHROMBIN III LEVELS.   Glucose, capillary     Status: Abnormal   Collection Time: 07/22/16  6:23 AM  Result Value Ref Range   Glucose-Capillary 114 (H) 65 - 99 mg/dL   Comment 1 Notify RN   CBC     Status: Abnormal   Collection Time: 07/22/16  9:40 AM  Result Value Ref Range   WBC 11.7 (H) 4.0 - 10.5 K/uL    Comment: RESULTS VERIFIED VIA RECOLLECT REPEATED TO VERIFY    RBC 4.15 3.87 - 5.11 MIL/uL   Hemoglobin 12.5 12.0 - 15.0 g/dL    Comment: RESULTS VERIFIED VIA RECOLLECT REPEATED TO VERIFY    HCT 37.8 36.0 - 46.0 %   MCV 91.1 78.0 - 100.0 fL   MCH 30.1 26.0 - 34.0 pg   MCHC 33.1 30.0 - 36.0 g/dL   RDW 20.9 (H) 11.5 - 15.5 %   Platelets 189 150 - 400 K/uL    Comment: RESULTS VERIFIED VIA RECOLLECT REPEATED TO VERIFY   Glucose, capillary     Status: Abnormal   Collection Time: 07/22/16 12:04 PM  Result Value Ref Range   Glucose-Capillary 122 (H) 65 - 99 mg/dL  Glucose, capillary     Status: Abnormal   Collection Time: 07/22/16  4:52 PM  Result Value Ref Range   Glucose-Capillary 151 (H) 65 - 99 mg/dL  Glucose, capillary     Status: Abnormal   Collection Time: 07/22/16  9:16 PM  Result Value Ref Range   Glucose-Capillary 213 (H) 65 - 99 mg/dL  Heparin level (unfractionated)     Status: None   Collection Time: 07/23/16  5:36 AM  Result Value Ref Range   Heparin Unfractionated 0.49 0.30 - 0.70 IU/mL    Comment:        IF HEPARIN RESULTS ARE BELOW EXPECTED VALUES, AND PATIENT DOSAGE HAS BEEN CONFIRMED, SUGGEST FOLLOW UP TESTING OF ANTITHROMBIN III LEVELS.   CBC     Status: Abnormal   Collection Time: 07/23/16  5:36 AM  Result Value Ref Range   WBC 11.4 (H) 4.0 - 10.5 K/uL   RBC 3.57 (L) 3.87 - 5.11 MIL/uL   Hemoglobin 10.3 (L) 12.0 - 15.0 g/dL   HCT 31.9 (L) 36.0 - 46.0 %   MCV 89.4 78.0 - 100.0 fL   MCH 28.9 26.0 - 34.0 pg   MCHC 32.3 30.0 - 36.0 g/dL   RDW 20.1 (H) 11.5 - 15.5 %   Platelets 248 150 - 400 K/uL  Protime-INR     Status:  Abnormal   Collection Time: 07/23/16  5:36 AM  Result Value Ref Range   Prothrombin Time 15.8 (H) 11.4 - 15.2 seconds   INR 1.26   Glucose, capillary     Status: Abnormal   Collection Time: 07/23/16  6:51 AM  Result Value Ref Range   Glucose-Capillary 127 (H) 65 - 99 mg/dL     HEENT: normocephalic and atraumatic.  Oral: poor dentition Cardio: RRR without murmur. No JVD  Resp:CTA Bilaterally without wheezes or rales. Normal effort  Musc:  Edema R BKA with tenderness still, well shaped. Healed left BKA. Can extend right knee fully Neuro: Alert/Oriented Motor 4+/5 in BUE  LLE: HF, KE 4/5 RLE:  HF 3/5, KE 3+/5    Gen NAD. Well developed Skin:   R BKA incision with dressing c/d/i---left AVG site clean and intact also with minimal swelling  Assessment/Plan: 1. Functional deficits secondary to New right and recent left BKA which require 3+ hours per day of interdisciplinary therapy in a comprehensive inpatient rehab setting. Physiatrist is providing close team supervision and 24 hour management of active medical problems listed below. Physiatrist and rehab team continue to assess barriers to discharge/monitor patient progress toward functional and medical goals. FIM: Function - Bathing Position: Wheelchair/chair at sink Body parts bathed by patient: Right arm, Left arm, Chest, Abdomen, Front perineal area, Buttocks, Right upper leg, Left upper leg Body parts bathed by helper: Back Bathing not applicable: Left lower leg, Right lower leg Assist Level: Set up  Function- Upper Body Dressing/Undressing What is the patient wearing?: Pull over shirt/dress Pull over shirt/dress - Perfomed by patient: Thread/unthread right sleeve, Thread/unthread left sleeve, Put head through opening, Pull shirt over trunk Assist Level: More than reasonable time Set up : To obtain clothing/put away Function - Lower Body Dressing/Undressing What is the patient wearing?: Pants Position: Wheelchair/chair at  sink Underwear - Performed by patient: Thread/unthread right underwear leg, Thread/unthread left underwear leg, Pull underwear up/down Pants- Performed by patient: Thread/unthread right pants leg, Thread/unthread left pants leg Pants- Performed by helper: Pull pants up/down Assist for lower body dressing: Supervision or verbal cues  Function - Toileting Toileting activity did not occur: No continent bowel/bladder event Toileting steps completed by patient: Adjust clothing prior to toileting, Performs perineal hygiene, Adjust clothing after toileting Toileting steps completed by helper: Adjust clothing prior to toileting, Adjust clothing after toileting Toileting Assistive Devices: Grab bar or rail Assist level: Supervision or verbal cues  Function - Air cabin crew transfer activity did not occur:  (not attempted) Toilet transfer assistive device: Bedside commode Assist level to toilet: Maximal assist (Pt 25 - 49%/lift and lower) Assist level from toilet: Moderate assist (Pt 50 - 74%/lift or lower) Assist level to bedside commode (at bedside): Supervision or verbal cues Assist level from bedside commode (at bedside): Supervision or verbal cues  Function - Chair/bed transfer Chair/bed transfer activity did not occur: N/A Chair/bed transfer method: Lateral scoot Chair/bed transfer assist level: Supervision or verbal cues Chair/bed transfer assistive device: Armrests, Sliding board Chair/bed transfer details: Verbal cues for technique, Verbal cues for precautions/safety  Function - Locomotion: Wheelchair Will patient use wheelchair at discharge?: Yes Type: Manual Max wheelchair distance: 123f  Assist Level: Supervision or verbal cues Assist Level: Supervision or verbal cues Assist Level: Supervision or verbal cues Turns around,maneuvers to table,bed, and toilet,negotiates 3% grade,maneuvers on rugs and over doorsills: No Function - Locomotion: Ambulation Ambulation activity  did not occur: Safety/medical concerns Walk 10 feet activity did not occur: Safety/medical concerns Walk 50 feet with 2 turns activity did not occur: Safety/medical concerns Walk 150 feet activity did not occur: Safety/medical concerns Walk 10 feet on uneven surfaces activity did not occur: Safety/medical concerns  Function - Comprehension Comprehension: Auditory Comprehension assist level: Understands complex 90% of the time/cues 10% of the time  Function - Expression Expression: Verbal Expression assist level: Expresses complex 90% of the time/cues < 10% of the time  Function - Social Interaction Social Interaction assist level: Interacts appropriately 90% of the time - Needs monitoring or encouragement for participation or interaction.  Function - Problem Solving Problem solving assist level: Solves basic 90% of the time/requires cueing < 10% of the time  Function - Memory Memory assist level: Recognizes or recalls 90% of the time/requires cueing < 10% of the time Patient normally able to recall (first 3 days only): Current season, That he or she is in a hospital, Staff names and faces, Location of own room    Medical Problem List and Plan:  1. 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   Can go home tomorrow morning since HD tonight.   -HH follow up for lovenox/labs/coumadin 2. DVT Prophylaxis/Anticoagulation: heparin to Coumadin therapy for history of mechanical aortic valve   -change to treatment dose lovenox and dc home- 3. Pain Management: Gabapentin/TENS for phantom pain,   -discussed proper wrapping of limb, massage and desensitization also 4. Mood: Provide emotional support  5. Neuropsych: This patient is capable of making decisions on her own behalf.  6. Skin/Wound Care: Dry dressing/ACE to right BK . Drainage decreasing. Local care to left BKA---almost healed  7. Fluids/Electrolytes/Nutrition: Routine  I&Os  8. ESRD. Continue dialysis as directed. HD to occur at the end of the day to allow for participation in rehab activities per Nephro. Needed HD again today due to complains of SOB with fluid overload 9. Leukocytosis.   WBCs 10.7 on 6/10  Cont to monitor 10. Acute on chronic anemia. Continue Aranesp.   Hb 7.0 on 6/10, transfused to 2units PRBC per Nephro---17.2 today??  Cont to monitor 11. Hypertension. Lopressor 25 mg twice a day. Monitor with increased mobility   Controlled   12. CAD/AVR.   Coumadin/heparin prior to procedure 14. Diabetes mellitus of peripheral neuropathy. Hemoglobin A1c 5.7.Bouts of hypoglycemia with sliding scale discontinued  contol is fair, became hypoglycemic on glimipride  Fair control 15. Hypothyroidism. Synthroid  17. SOB   CXR reviewed, showing pleural effusion  Volume mgt per renal  LOS (Days) 13 A FACE TO FACE EVALUATION WAS PERFORMED  [name] T 07/23/2016, 9:15 AM

## 2016-07-23 NOTE — Progress Notes (Signed)
Physical Therapy Session Note  Patient Details  Name: [name] MRN: [id number] Date of Birth: 06-01-1955  Today's Date: 07/23/2016 PT Individual Time: 0800-0900 AND 1330-1445 PT Individual Time Calculation (min): 60 min   Short Term Goals: Week 1:  PT Short Term Goal 1 (Week 1): STG=LTG to to estimated length of stay  PT Short Term Goal 1 - Progress (Week 1): Progressing toward goal Week 2:  PT Short Term Goal 1 (Week 2): STG=LTG due to ELOS  Skilled Therapeutic Interventions/Progress Updates:    Pt received supine in bed and agreeable to PT. Supine>sit transfer with supervision assist and  Min cues. Family present for entire treatment for education and transfer training.   Lateral scoot transfer training completed to and from various surfaces and heights with SB x 8 throughout treatment. Supervision assist over all for bed<>transfers. Can transfer completed x 2 with supervision assist on first attempt and min assist on second. Moderate cues to pt and family for proper SB placement and head hips relationship when using SB.  PT educated family on AP transfer for [facility], but unable to practice due to time constrainsts.   PT instructed pt in in bed mobility including rolling R and L as well as sit<>supine. Min cues for proper LE positioning and use of BUE.   WC mobility through hall 178f x 2 and 1260f PT provided supervision assist for safety and improved turning control.   Education to family for proper pressure relief to prevent increase pressure ulcers.   [facility] too [facility] and [facility] reach and all needs met.    Session 2.   Pt received sitting in WC and agreeable to PT  WC mobility in various environments including in hall of rehab unit x [id number] concrete sidewalk at hospital entrance x [id number] up down ramp to parking garage. Supervision assist on level and unlevel surface with min cues for safety on slight downhill grade. Min assist  on ramp to prevent loss of progress as well as cues proper trunk position to prevent tipping posteriorly  PT instructed pt in grad day assessment to measure progress towards goals. See discharge note for details.   Pt instructed by PT in home Exercise program including SAQ, LAQ, glute sets, quad sets and hip extension. All completed x 6 BLE with cues for proper ROM and speed of movement.  Patient returned too room and [facility] reach and all needs met.           Therapy Documentation Precautions:  Precautions Precautions: Fall Restrictions Weight Bearing Restrictions: Yes RLE Weight Bearing: Non weight bearing LLE Weight Bearing: Non weight bearing Pain:   0/10  See Function Navigator for Current Functional Status.   Therapy/Group: Individual Therapy  AustLorie Phenix3/2018, 9:01 AM

## 2016-07-23 NOTE — Progress Notes (Signed)
[name] Progress Note   Subjective: Getting bath. No C/Os. Excited about going home. Explained to patient that she will have to take lovenox on regular basis. Has new 1st stage brachial vein transposition which was placed 07/21/16.  Objective Vitals:   07/22/16 1507 07/22/16 2045 07/23/16 0355 07/23/16 0913  BP: (!) 106/50 118/62 (!) 104/57 (!) 102/48  Pulse: 66 68 (!) 52   Resp: 17  18   Temp: 97.4 F (36.3 C)  98.4 F (36.9 C)   TempSrc: Oral  Oral   SpO2: 94%  96%   Weight:   50.4 kg (111 lb 1.8 oz)   Height:       Physical Exam General: chronically ill appearing female in NAD Heart: S1, S2, RRR Lungs: CTAB A/P Abdomen: Active BS Extremities: Bilateral BKA. Ace wrap around R BKA, trace R stump edema Dialysis Access: LUA AVF maturing-strong bruit. RIJ TDC drsg CDI.    Additional Objective Labs: Basic Metabolic Panel:  Recent Labs Lab 07/16/16 1553 07/17/16 0603 07/18/16 1630 07/20/16 1441  NA 128* 132* 132* 135  K 3.5 3.5 3.4* 3.5  CL 93* 96* 96* 100*  CO2 24 26 26 26   GLUCOSE 288* 134* 262* 163*  BUN 17 7 14  5*  CREATININE 5.56* 3.10* 4.61* 2.86*  CALCIUM 9.0 8.6* 8.5* 8.8*  PHOS 6.5*  --  5.9* 3.9   Liver Function Tests:  Recent Labs Lab 07/16/16 1553 07/18/16 1630 07/20/16 1441  ALBUMIN 1.7* 1.6* 1.7*   No results for input(s): LIPASE, AMYLASE in the last 168 hours. CBC:  Recent Labs Lab 07/20/16 0507 07/21/16 0533 07/22/16 0442 07/22/16 0940 07/23/16 0536  WBC 10.7* 13.1* 7.3 11.7* 11.4*  HGB 7.0* 11.5* 17.2* 12.5 10.3*  HCT 22.5* 34.7* 50.8* 37.8 31.9*  MCV 92.6 88.5 90.2 91.1 89.4  PLT 284 245 88* 189 248   Blood Culture    Component Value Date/Time   SDES BLOOD RIGHT HAND 06/30/2016 2247   SPECREQUEST  06/30/2016 2247    BOTTLES DRAWN AEROBIC AND ANAEROBIC Blood Culture adequate volume   CULT NO GROWTH 5 DAYS 06/30/2016 2247   REPTSTATUS 07/06/2016 FINAL 06/30/2016 2247    Cardiac Enzymes: No results for  input(s): CKTOTAL, CKMB, CKMBINDEX, TROPONINI in the last 168 hours. CBG:  Recent Labs Lab 07/22/16 0623 07/22/16 1204 07/22/16 1652 07/22/16 2116 07/23/16 0651  GLUCAP 114* 122* 151* 213* 127*   Iron Studies: No results for input(s): IRON, TIBC, TRANSFERRIN, FERRITIN in the last 72 hours. @lablastinr3 @ Studies/Results: No results found. Medications: . ferric gluconate (FERRLECIT/NULECIT) IV     . aspirin EC  81 mg Oral Daily  . calcitRIOL  0.5 mcg Oral Q M,W,F-HD  . calcium acetate  667 mg Oral TID WC  . darbepoetin (ARANESP) injection - DIALYSIS  200 mcg Intravenous Q Mon-HD  . docusate sodium  100 mg Oral Daily  . enoxaparin (LOVENOX) injection  1 mg/kg Subcutaneous Q24H  . feeding supplement  1 Container Oral TID BM  . feeding supplement (PRO-STAT SUGAR FREE 64)  30 mL Oral BID  . gabapentin  300 mg Oral QHS  . levothyroxine  175 mcg Oral QAC breakfast  . metoCLOPramide  5 mg Oral BID AC  . metoprolol tartrate  12.5 mg Oral BID  . multivitamin  1 tablet Oral QHS  . polyethylene glycol  17 g Oral BID  . senna-docusate  1 tablet Oral BID  . tacrolimus  2 mg Oral BID  . warfarin  2.5 mg  Oral ONCE-1800  . Warfarin - Pharmacist Dosing Inpatient   Does not apply q1800   Dialysis Orders:  [name] MWF 3.5h R IJ cath 50kg Hep none 2/2 bath  -Calcitriol 0.5 mcg PO q HD -Mircera 50 mcg IV q 4 weeks   Assessment/Plan: 1. Gangrene R foot/sp R BKA : On on rehab. Plan for DC home tomorrow  2. Dyspnea/ vol overload: Improved, continue lowering EDW as tolerated.  3. ESRD - next HD today back on MWF schedule. 4. Anemia - HGB down to 7.0 rec'd 2 units PRBCs 07/20/16. HGB 10.3 today. follow HGB. ESA ordered.  5. Secondary hyperparathyroidism - continue binders/VDRA. Renal profile with HD today.  6. HTN/volume - BP soft. May need to lower metoprolol dose 7. Nutrition - Albumin 1.7. Severe PCM. Renal diet, boost/ add prostat.  8. Mechanical AVR on coumadin: INR 1.26 today.  Will go home on lovenox bridging to therapeutic INR on coumadin.  9. H/O liver transplant. Cont immunosuppressant therapy.  10. DM; Per primary  Disposition: Home tomorrow.   [name] NP-C 07/23/2016, 12:15 PM  [facility] [phone]  Pt seen, examined and agree w A/P as above.  [name] MD [name] pager [phone]   07/23/2016, 12:57 PM

## 2016-07-23 NOTE — Progress Notes (Signed)
Occupational Therapy Session Note  Patient Details  Name: [name] MRN: [id number] Date of Birth: Jun 04, 1955  Today's Date: 07/23/2016 OT Individual Time: 0930-1030 OT Individual Time Calculation (min): 60 min    Short Term Goals: Week 2: STG=LTG due to LOS  Skilled Therapeutic Interventions/Progress Updates:    Pt seen for OT session focusing on functional transfers, education, and UE strengthening. Pt sitting up in w/c upon arrival with daughter's present. Pt agreeable to tx session. [name] caregivers voiced having completed family education with PT and declining need to practice more transfers. Encouraged them to observe A/P transfer to [facility] as this will be different than her sliding board transfers and how she transferred PTA. Pt completed transfer at supervision level, requiring VCs for proper set-up of w/c and sequencing of task.  Discussed with pt and caregiver shower transfer at home. On admission pt stated she would sponge bathe and was not interested in completing showering task at d/c, therefore had not been addressed at this point. Educated regarding tub transfer bench  And DME as well as need to cover port and residual limb. Encouraged family to wait until [facility] can assess and order needed DME prior to attempting shower, they voiced understanding. [name] family then left and therapist taken to therapy gym. She completed supervision slide board to therapy may with set-up/ supervision. Completed supine UE strengthening exercises including x10 chest press and x5 overhead raises using #5 dowel rod. She then completed x10 hip flexion and x10 hip ABduction on R/L. Returned to w/c via posterior scoot with supervision. Pt returned to room and left set-up completing grooming tasks at sink, call bell in reach. Educated throughout session regarding [place], DME, importance of maintaining skin integrity, and d/c planning. [name] daughters declined practicing figure 8 wrapping of residual limb, stating  they assisted with this during previous amputation.   Therapy Documentation Precautions:  Precautions Precautions: Fall Restrictions Weight Bearing Restrictions: Yes RLE Weight Bearing: Non weight bearing LLE Weight Bearing: Non weight bearing Pain:   No/ denies pain  See Function Navigator for Current Functional Status.   Therapy/Group: Individual Therapy  Lewis, Sherel Fennell C 07/23/2016, 10:49 AM

## 2016-07-23 NOTE — Progress Notes (Signed)
Occupational Therapy Discharge Summary  Patient Details  Name: [name] MRN: [id number] Date of Birth: 10/20/1955   Patient has met 9 of 9 long term goals due to improved activity tolerance, improved balance, postural control and improved coordination.  Patient to discharge at overall Supervision level.  Patient's care partner is independent to provide the necessary physical and cognitive assistance at discharge.  Pt has standard BSC and must complete A/P transfer onto [facility]. She is able to complete with supervision. Completed family education with pt's daughter, voiced feeling comfortable and confident to provide needed assist at d/[name]. Have not completed shower transfer with pt as pt not wanting to shower given recent procedure. She is also remodeling bathroom and is unsure of set-up/ what DME will be required. Recommended to pt and pt's family to wait for [facility] assist prior to attempting showering task. Recommended sponge bathing at this time, pt able to complete at set-up/ supervision level.    Recommendation:  Patient will benefit from ongoing skilled OT services in home health setting to continue to advance functional skills in the area of BADL and Reduce care partner burden.  Equipment: sliding board and amputee support pad  Reasons for discharge: treatment goals met and discharge from hospital  Patient/family agrees with progress made and goals achieved: Yes  OT Discharge Precautions/Restrictions  Precautions Precautions: Fall Restrictions Weight Bearing Restrictions: Yes RLE Weight Bearing: Non weight bearing LLE Weight Bearing: Non weight bearing General Chart Reviewed: Yes Vision Baseline Vision/History: Wears glasses Wears Glasses: Reading only Patient Visual Report: No change from baseline Vision Assessment?: No apparent visual deficits Perception  Perception: Within Functional Limits Praxis Praxis: Intact Cognition Overall Cognitive Status: Impaired/Different  from baseline Arousal/Alertness: Awake/alert Orientation Level: Oriented X4 Memory: Impaired Memory Impairment: Retrieval deficit;Decreased recall of new information Awareness: Impaired Awareness Impairment: Anticipatory impairment Problem Solving: Impaired Problem Solving Impairment: Functional complex;Verbal complex Safety/Judgment: Appears intact Sensation Sensation Light Touch: Appears Intact Proprioception: Appears Intact Coordination Gross Motor Movements are Fluid and Coordinated: Yes Fine Motor Movements are Fluid and Coordinated: Yes Motor  Motor Motor: Within Functional Limits Motor - Discharge Observations: Generalized weakness though improved since admission Trunk/Postural Assessment  Cervical Assessment Cervical Assessment: Within Functional Limits Thoracic Assessment Thoracic Assessment: Exceptions to [facility] (Kyphotic) Lumbar Assessment Lumbar Assessment: Exceptions to [facility] (Posterior pelvic tilt) Postural Control Postural Control: Within Functional Limits  Balance Balance Balance Assessed: Yes Dynamic Sitting Balance Dynamic Sitting - Balance Support: During functional activity Dynamic Sitting - Level of Assistance: 6: Modified independent (Device/Increase time) Extremity/Trunk Assessment RUE Assessment RUE Assessment: Within Functional Limits LUE Assessment LUE Assessment: Within Functional Limits   See Function Navigator for Current Functional Status.  [name], [name] 07/23/2016, 12:45 PM

## 2016-07-24 LAB — GLUCOSE, CAPILLARY
GLUCOSE-CAPILLARY: 113 mg/dL — AB (ref 65–99)
GLUCOSE-CAPILLARY: 223 mg/dL — AB (ref 65–99)

## 2016-07-24 LAB — PROTIME-INR
INR: 1.35
PROTHROMBIN TIME: 16.8 s — AB (ref 11.4–15.2)

## 2016-07-24 MED ORDER — CALCITRIOL 0.5 MCG PO CAPS: 0.5000 ug | ORAL_CAPSULE | ORAL | 1 refills | Status: AC

## 2016-07-24 MED ORDER — ENOXAPARIN SODIUM 60 MG/0.6ML ~~LOC~~ SOLN: 1.0000 mg/kg | SUBCUTANEOUS | 0 refills | Status: DC

## 2016-07-24 MED ORDER — GABAPENTIN 300 MG PO CAPS: 300.0000 mg | ORAL_CAPSULE | Freq: Every day | ORAL | 1 refills | Status: DC

## 2016-07-24 MED ORDER — OXYCODONE HCL 10 MG PO TABS: 10.0000 mg | ORAL_TABLET | ORAL | 0 refills | Status: DC | PRN

## 2016-07-24 MED ORDER — WARFARIN SODIUM 2.5 MG PO TABS: 2.5000 mg | ORAL_TABLET | Freq: Every day | ORAL | 0 refills | Status: DC

## 2016-07-24 MED ORDER — LEVOTHYROXINE SODIUM 175 MCG PO TABS: 175.0000 ug | ORAL_TABLET | Freq: Every day | ORAL | 1 refills | Status: DC

## 2016-07-24 MED ORDER — METHOCARBAMOL 500 MG PO TABS
250.0000 mg | ORAL_TABLET | Freq: Four times a day (QID) | ORAL | 0 refills | Status: AC | PRN
Start: 2016-07-24 — End: ?

## 2016-07-24 MED ORDER — CALCIUM ACETATE (PHOS BINDER) 667 MG PO CAPS: 667.0000 mg | ORAL_CAPSULE | Freq: Three times a day (TID) | ORAL | 1 refills | Status: AC

## 2016-07-24 MED ORDER — METOCLOPRAMIDE HCL 5 MG PO TABS: 5.0000 mg | ORAL_TABLET | Freq: Two times a day (BID) | ORAL | 0 refills | Status: AC

## 2016-07-24 MED ORDER — METOPROLOL TARTRATE 25 MG PO TABS: 12.5000 mg | ORAL_TABLET | Freq: Two times a day (BID) | ORAL | 1 refills | Status: DC

## 2016-07-24 NOTE — Progress Notes (Signed)
Subjective/Complaints: In good spirits. Ready to go home. Pain minimal  ROS: pt denies nausea, vomiting, diarrhea, cough, shortness of breath or chest pain   Objective: Vital Signs: Blood pressure (!) 103/57, pulse 77, temperature 98.8 F (37.1 C), temperature source Oral, resp. rate 16, height _0  (1.626 m), weight 49.9 kg (110 lb), SpO2 98 %. No results found. Results for orders placed or performed during the hospital encounter of 07/10/16 (from the past 72 hour(s))  Glucose, capillary     Status: None   Collection Time: 07/21/16 12:23 PM  Result Value Ref Range   Glucose-Capillary 69 65 - 99 mg/dL  Glucose, capillary     Status: Abnormal   Collection Time: 07/21/16  4:16 PM  Result Value Ref Range   Glucose-Capillary 175 (H) 65 - 99 mg/dL  Glucose, capillary     Status: Abnormal   Collection Time: 07/21/16  9:13 PM  Result Value Ref Range   Glucose-Capillary 162 (H) 65 - 99 mg/dL   Comment 1 Notify RN   Heparin level (unfractionated)     Status: None   Collection Time: 07/21/16 10:35 PM  Result Value Ref Range   Heparin Unfractionated 0.30 0.30 - 0.70 IU/mL    Comment:        IF HEPARIN RESULTS ARE BELOW EXPECTED VALUES, AND PATIENT DOSAGE HAS BEEN CONFIRMED, SUGGEST FOLLOW UP TESTING OF ANTITHROMBIN III LEVELS.   Protime-INR     Status: None   Collection Time: 07/22/16  4:42 AM  Result Value Ref Range   Prothrombin Time 15.0 11.4 - 15.2 seconds   INR 1.18   CBC     Status: Abnormal   Collection Time: 07/22/16  4:42 AM  Result Value Ref Range   WBC 7.3 4.0 - 10.5 K/uL   RBC 5.63 (H) 3.87 - 5.11 MIL/uL   Hemoglobin 17.2 (H) 12.0 - 15.0 g/dL    Comment: DELTA CHECK NOTED REPEATED TO VERIFY    HCT 50.8 (H) 36.0 - 46.0 %   MCV 90.2 78.0 - 100.0 fL   MCH 30.6 26.0 - 34.0 pg   MCHC 33.9 30.0 - 36.0 g/dL   RDW 21.4 (H) 11.5 - 15.5 %   Platelets 88 (L) 150 - 400 K/uL    Comment: DELTA CHECK NOTED SPECIMEN CHECKED FOR CLOTS REPEATED TO VERIFY PLATELET COUNT  CONFIRMED BY SMEAR   Heparin level (unfractionated)     Status: None   Collection Time: 07/22/16  4:42 AM  Result Value Ref Range   Heparin Unfractionated 0.44 0.30 - 0.70 IU/mL    Comment:        IF HEPARIN RESULTS ARE BELOW EXPECTED VALUES, AND PATIENT DOSAGE HAS BEEN CONFIRMED, SUGGEST FOLLOW UP TESTING OF ANTITHROMBIN III LEVELS.   Glucose, capillary     Status: Abnormal   Collection Time: 07/22/16  6:23 AM  Result Value Ref Range   Glucose-Capillary 114 (H) 65 - 99 mg/dL   Comment 1 Notify RN   CBC     Status: Abnormal   Collection Time: 07/22/16  9:40 AM  Result Value Ref Range   WBC 11.7 (H) 4.0 - 10.5 K/uL    Comment: RESULTS VERIFIED VIA RECOLLECT REPEATED TO VERIFY    RBC 4.15 3.87 - 5.11 MIL/uL   Hemoglobin 12.5 12.0 - 15.0 g/dL    Comment: RESULTS VERIFIED VIA RECOLLECT REPEATED TO VERIFY    HCT 37.8 36.0 - 46.0 %   MCV 91.1 78.0 - 100.0 fL   MCH 30.1  26.0 - 34.0 pg   MCHC 33.1 30.0 - 36.0 g/dL   RDW 20.9 (H) 11.5 - 15.5 %   Platelets 189 150 - 400 K/uL    Comment: RESULTS VERIFIED VIA RECOLLECT REPEATED TO VERIFY   Glucose, capillary     Status: Abnormal   Collection Time: 07/22/16 12:04 PM  Result Value Ref Range   Glucose-Capillary 122 (H) 65 - 99 mg/dL  Glucose, capillary     Status: Abnormal   Collection Time: 07/22/16  4:52 PM  Result Value Ref Range   Glucose-Capillary 151 (H) 65 - 99 mg/dL  Glucose, capillary     Status: Abnormal   Collection Time: 07/22/16  9:16 PM  Result Value Ref Range   Glucose-Capillary 213 (H) 65 - 99 mg/dL  Heparin level (unfractionated)     Status: None   Collection Time: 07/23/16  5:36 AM  Result Value Ref Range   Heparin Unfractionated 0.49 0.30 - 0.70 IU/mL    Comment:        IF HEPARIN RESULTS ARE BELOW EXPECTED VALUES, AND PATIENT DOSAGE HAS BEEN CONFIRMED, SUGGEST FOLLOW UP TESTING OF ANTITHROMBIN III LEVELS.   CBC     Status: Abnormal   Collection Time: 07/23/16  5:36 AM  Result Value Ref Range   WBC  11.4 (H) 4.0 - 10.5 K/uL   RBC 3.57 (L) 3.87 - 5.11 MIL/uL   Hemoglobin 10.3 (L) 12.0 - 15.0 g/dL   HCT 31.9 (L) 36.0 - 46.0 %   MCV 89.4 78.0 - 100.0 fL   MCH 28.9 26.0 - 34.0 pg   MCHC 32.3 30.0 - 36.0 g/dL   RDW 20.1 (H) 11.5 - 15.5 %   Platelets 248 150 - 400 K/uL  Protime-INR     Status: Abnormal   Collection Time: 07/23/16  5:36 AM  Result Value Ref Range   Prothrombin Time 15.8 (H) 11.4 - 15.2 seconds   INR 1.26   Glucose, capillary     Status: Abnormal   Collection Time: 07/23/16  6:51 AM  Result Value Ref Range   Glucose-Capillary 127 (H) 65 - 99 mg/dL  Glucose, capillary     Status: Abnormal   Collection Time: 07/23/16 12:50 PM  Result Value Ref Range   Glucose-Capillary 175 (H) 65 - 99 mg/dL  Renal function panel     Status: Abnormal   Collection Time: 07/23/16  8:45 PM  Result Value Ref Range   Sodium 134 (L) 135 - 145 mmol/L   Potassium 4.0 3.5 - 5.1 mmol/L   Chloride 97 (L) 101 - 111 mmol/L   CO2 26 22 - 32 mmol/L   Glucose, Bld 138 (H) 65 - 99 mg/dL   BUN 9 6 - 20 mg/dL   Creatinine, Ser 3.08 (H) 0.44 - 1.00 mg/dL   Calcium 8.9 8.9 - 10.3 mg/dL   Phosphorus 3.5 2.5 - 4.6 mg/dL   Albumin 2.1 (L) 3.5 - 5.0 g/dL   GFR calc non Af Amer 15 (L) >60 mL/min   GFR calc Af Amer 18 (L) >60 mL/min    Comment: (NOTE) The eGFR has been calculated using the CKD EPI equation. This calculation has not been validated in all clinical situations. eGFR's persistently <60 mL/min signify possible Chronic Kidney Disease.    Anion gap 11 5 - 15  Glucose, capillary     Status: Abnormal   Collection Time: 07/23/16  9:07 PM  Result Value Ref Range   Glucose-Capillary 120 (H) 65 - 99 mg/dL  Protime-INR     Status: Abnormal   Collection Time: 07/24/16  5:01 AM  Result Value Ref Range   Prothrombin Time 16.8 (H) 11.4 - 15.2 seconds   INR 1.35   Glucose, capillary     Status: Abnormal   Collection Time: 07/24/16  6:32 AM  Result Value Ref Range   Glucose-Capillary 113 (H) 65 -  99 mg/dL     HEENT: normocephalic and atraumatic.  Oral: poor dentition Cardio: RRR  Resp:CTA Bilaterally without wheezes or rales. Normal effort  Musc:  Edema R BKA with tenderness still, well shaped. Healed left BKA. Can extend right knee fully Neuro: Alert/Oriented Motor 4+/5 in BUE  LLE: HF, KE 4/5 RLE: HF 3/5, KE 3+/5    Gen NAD. Well developed Skin:   R BKA incision with dressing c/d/i. Left BK with mild abrasion--left AVG site clean and intact also with minimal swelling  Assessment/Plan: 1. Functional deficits secondary to New right and recent left BKA which require 3+ hours per day of interdisciplinary therapy in a comprehensive inpatient rehab setting. Physiatrist is providing close team supervision and 24 hour management of active medical problems listed below. Physiatrist and rehab team continue to assess barriers to discharge/monitor patient progress toward functional and medical goals. FIM: Function - Bathing Position: Wheelchair/chair at sink Body parts bathed by patient: Right arm, Left arm, Chest, Abdomen, Front perineal area, Buttocks, Right upper leg, Left upper leg Body parts bathed by helper: Back Bathing not applicable: Left lower leg, Right lower leg Assist Level: Set up  Function- Upper Body Dressing/Undressing What is the patient wearing?: Pull over shirt/dress Pull over shirt/dress - Perfomed by patient: Thread/unthread right sleeve, Thread/unthread left sleeve, Put head through opening, Pull shirt over trunk Assist Level: More than reasonable time Set up : To obtain clothing/put away Function - Lower Body Dressing/Undressing What is the patient wearing?: Pants Position: Wheelchair/chair at sink Underwear - Performed by patient: Thread/unthread right underwear leg, Thread/unthread left underwear leg, Pull underwear up/down Pants- Performed by patient: Thread/unthread right pants leg, Thread/unthread left pants leg Pants- Performed by helper: Pull pants  up/down Assist for lower body dressing: Supervision or verbal cues  Function - Toileting Toileting activity did not occur: No continent bowel/bladder event Toileting steps completed by patient: Adjust clothing prior to toileting Toileting steps completed by helper: Performs perineal hygiene, Adjust clothing after toileting Toileting Assistive Devices: Grab bar or rail Assist level: Supervision or verbal cues  Function - Air cabin crew transfer activity did not occur:  (not attempted) Toilet transfer assistive device: Bedside commode Assist level to toilet: Maximal assist (Pt 25 - 49%/lift and lower) Assist level from toilet: Moderate assist (Pt 50 - 74%/lift or lower) Assist level to bedside commode (at bedside): Supervision or verbal cues Assist level from bedside commode (at bedside): Supervision or verbal cues  Function - Chair/bed transfer Chair/bed transfer activity did not occur: N/A Chair/bed transfer method: Lateral scoot Chair/bed transfer assist level: Supervision or verbal cues Chair/bed transfer assistive device: Armrests, Sliding board Chair/bed transfer details: Verbal cues for technique, Verbal cues for precautions/safety  Function - Locomotion: Wheelchair Will patient use wheelchair at discharge?: Yes Type: Manual Max wheelchair distance: 13f  Assist Level: Supervision or verbal cues Assist Level: Supervision or verbal cues Assist Level: Supervision or verbal cues Turns around,maneuvers to table,bed, and toilet,negotiates 3% grade,maneuvers on rugs and over doorsills: No Function - Locomotion: Ambulation Ambulation activity did not occur: Safety/medical concerns Walk 10 feet activity did not occur: Safety/medical concerns Walk  50 feet with 2 turns activity did not occur: Safety/medical concerns Walk 150 feet activity did not occur: Safety/medical concerns Walk 10 feet on uneven surfaces activity did not occur: Safety/medical concerns  Function -  Comprehension Comprehension: Auditory Comprehension assist level: Understands complex 90% of the time/cues 10% of the time  Function - Expression Expression: Verbal Expression assist level: Expresses complex 90% of the time/cues < 10% of the time  Function - Social Interaction Social Interaction assist level: Interacts appropriately 90% of the time - Needs monitoring or encouragement for participation or interaction.  Function - Problem Solving Problem solving assist level: Solves basic 90% of the time/requires cueing < 10% of the time  Function - Memory Memory assist level: Recognizes or recalls 90% of the time/requires cueing < 10% of the time Patient normally able to recall (first 3 days only): Current season, Location of own room, Staff names and faces, That he or she is in a hospital    Medical Problem List and Plan:  1. 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   -home health follow up  -[place] follow up for lovenox/labs/coumadin  -Patient to see Rehab MD in the office for transitional care encounter in 1-2 weeks. 2. DVT Prophylaxis/Anticoagulation:  - treatment dose lovenox until INR therapeutic  3. Pain Management: Gabapentin/TENS for phantom pain,   -discussed proper wrapping of limb, massage and desensitization also 4. Mood: Provide emotional support  5. Neuropsych: This patient is capable of making decisions on her own behalf.  6. Skin/Wound Care: Dry dressing/ACE to right BK . Drainage decreasing. Local care to left BKA---almost healed  7. Fluids/Electrolytes/Nutrition: Routine I&Os  8. ESRD. Continue dialysis as directed. HD to occur at the end of the day to allow for participation in rehab activities per Nephro.  9. Leukocytosis.   WBCs 10.7 on 6/10  Cont to monitor 10. Acute on chronic anemia. Continue Aranesp.   Hb 7.0 on 6/10, transfused to 2units PRBC per Nephro---17.2 today??  Cont to monitor 11.  Hypertension. Lopressor 25 mg twice a day. Monitor with increased mobility   Controlled   12. CAD/AVR.   Coumadin/heparin prior to procedure 14. Diabetes mellitus of peripheral neuropathy. Hemoglobin A1c 5.7.Bouts of hypoglycemia with sliding scale discontinued  contol is fair, became hypoglycemic on glimipride  Fair control 15. Hypothyroidism. Synthroid  17. SOB   CXR reviewed, showing pleural effusion  Volume mgt per renal  LOS (Days) 14 A FACE TO FACE EVALUATION WAS PERFORMED  SWARTZ,ZACHARY T 07/24/2016, 10:23 AM

## 2016-07-24 NOTE — Progress Notes (Signed)
Social Work  Discharge Note  The overall goal for the admission was met for:   Discharge location: Yes - home with daughter able to provide 24/7 assistance  Length of Stay: Yes - 14 days  Discharge activity level: Yes - supervision to min assist  Home/community participation: Yes  Services provided included: MD, RD, PT, OT, RN, TR, Pharmacy and [place]: Medicare and Private Insurance: [place]  Follow-up services arranged: Home Health: Therapist, sports, PT, OT via [place], DME: Right amputee support pad for w/c, 30" transfer board via [place] and Patient/Family has no preference for HH/DME agencies  Comments (or additional information):  Patient/Family verbalized understanding of follow-up arrangements: Yes  Individual responsible for coordination of the follow-up plan: pt  Confirmed correct DME delivered: Deloy Archey 07/24/2016    Laquenta Whitsell

## 2016-07-24 NOTE — Progress Notes (Signed)
Discharged to home accompanied by daughter. D/C instructions give per Alcide Goodness, P.A. No questions noted. [name]

## 2016-07-24 NOTE — Progress Notes (Signed)
ANTICOAGULATION CONSULT NOTE - Follow Up Consult  Pharmacy Consult for coumadin and lovenox  Indication: hx of mechanical AVR  Allergies  Allergen Reactions  . Acetaminophen Other (See Comments)    Liver transplant recipient   . Codeine Itching  . Mirtazapine Other (See Comments)    hallucination    Patient Measurements: Height: 5\' 4"  (162.6 cm) Weight: 110 lb (49.9 kg) IBW/kg (Calculated) : 54.7 Heparin Dosing Weight:   Vital Signs: Temp: 98.8 F (37.1 C) (06/14 0439) Temp Source: Oral (06/14 0439) BP: 103/57 (06/14 0439) Pulse Rate: 77 (06/14 0439)  Labs:  Recent Labs  07/21/16 2235  07/22/16 0442 07/22/16 0940 07/23/16 0536 07/23/16 2045 07/24/16 0501  HGB  --   < > 17.2* 12.5 10.3*  --   --   HCT  --   --  50.8* 37.8 31.9*  --   --   PLT  --   --  88* 189 248  --   --   LABPROT  --   --  15.0  --  15.8*  --  16.8*  INR  --   --  1.18  --  1.26  --  1.35  HEPARINUNFRC 0.30  --  0.44  --  0.49  --   --   CREATININE  --   --   --   --   --  3.08*  --   < > = values in this interval not displayed.  Estimated Creatinine Clearance: 15.1 mL/min (A) (by C-G formula based on SCr of 3.08 mg/dL (H)).   Medications:  Scheduled:  . aspirin EC  81 mg Oral Daily  . calcitRIOL  0.5 mcg Oral Q M,W,F-HD  . calcium acetate  667 mg Oral TID WC  . darbepoetin (ARANESP) injection - DIALYSIS  200 mcg Intravenous Q Mon-HD  . docusate sodium  100 mg Oral Daily  . enoxaparin (LOVENOX) injection  1 mg/kg Subcutaneous Q24H  . feeding supplement  1 Container Oral TID BM  . feeding supplement (PRO-STAT SUGAR FREE 64)  30 mL Oral BID  . gabapentin  300 mg Oral QHS  . levothyroxine  175 mcg Oral QAC breakfast  . metoCLOPramide  5 mg Oral BID AC  . metoprolol tartrate  12.5 mg Oral BID  . multivitamin  1 tablet Oral QHS  . polyethylene glycol  17 g Oral BID  . senna-docusate  1 tablet Oral BID  . tacrolimus  2 mg Oral BID  . Warfarin - Pharmacist Dosing Inpatient   Does not  apply q1800   Infusions:  . sodium chloride    . sodium chloride    . ferric gluconate (FERRLECIT/NULECIT) IV      Assessment: 61 yo female with hx of mechanical AVR is currently on subtherapeutic coumadin bridging with treatment dose of lovenox.  INR today is 1.35.  Goal of Therapy:  Anti-Xa level 0.6-1 units/ml 4hrs after LMWH dose given; INR 2.5-3.5 Monitor platelets by anticoagulation protocol: Yes   Plan:  - continue lovenox 50 mg sq q24h until INR >/= 2.5 - f/u CBC q72 hours - coumadin 2.5 mg po x1 - INR daily  Takota Cahalan, Tsz-Yin 07/24/2016,8:40 AM

## 2016-07-24 NOTE — Progress Notes (Signed)
Social Work Patient ID: [name], female   DOB: 01/31/1956, 61 y.o.   MRN: [id number]  Rexene Alberts Social Worker Signed   Patient Care Conference Date of Service: 07/24/2016  8:43 AM      Hide copied text Hover for attribution information Inpatient RehabilitationTeam Conference and Plan of Care Update Date: 07/23/2016   Time: 10:45 AM      Patient Name: [name]      Medical Record Number: [id number]  Date of Birth: 1956/01/16 Sex: Female         Room/Bed: 4W23C/4W23C-01 Payor Info: Payor: MEDICARE / Plan: MEDICARE PART A AND B / Product Type: *No Product type* /     Admitting Diagnosis: BKA ESRD End Stage Renal Disease N18.6  Admit Date/Time:  07/10/2016  4:27 PM Admission Comments: No comment available    Primary Diagnosis:  Amputation of right lower extremity below knee [facility]) Principal Problem: Amputation of right lower extremity below knee [facility])       Patient Active Problem List    Diagnosis Date Noted  . SOB (shortness of breath)    . Type 2 diabetes mellitus with peripheral neuropathy (HCC)    . H/O mitral valve replacement with mechanical valve    . Amputation of right lower extremity below knee ([place]) 07/10/2016  . History of liver transplant ([place])    . Post-operative pain    . Unilateral complete BKA, right, subsequent encounter ([place])    . S/P bilateral BKA (below knee amputation) ([place])    . Malnutrition of moderate degree 07/01/2016  . Cellulitis 06/30/2016  . PAD (peripheral artery disease) ([place]) 06/30/2016  . Diabetic foot infection ([place]) 06/30/2016  . Pressure injury of skin 05/28/2016  . Volume overload 05/28/2016  . Acute exacerbation of CHF (congestive heart failure) ([place]) 05/27/2016  . Extremity atherosclerosis with resting pain ([place])    . H/O heart valve replacement with mechanical valve    . Phantom limb pain ([place])    . Subtherapeutic international normalized ratio (INR)    . Supratherapeutic INR    . Anemia of chronic disease    .  Chronic midline low back pain without sciatica    . ESRD on dialysis ([place])    . Type 2 diabetes mellitus with complication, with long-term current use of insulin ([place])    . S/P unilateral BKA (below knee amputation), left ([place]) 04/30/2016  . Unilateral complete BKA, left, initial encounter ([place]) 04/30/2016  . Abnormality of gait    . Coronary artery disease involving coronary bypass graft of native heart without angina pectoris    . Benign essential HTN    . History of migraine    . Leukocytosis    . Critical lower limb ischemia 04/22/2016  . Foot pain 04/21/2016  . Fever    . Advance care planning    . Goals of care, counseling/discussion    . Palliative care by specialist    . Sepsis ([place])    . Hypotension    . Claudication of left lower extremity ([place])    . Elevated troponin 03/05/2016  . Pressure injury of skin, stage 2 11/27/2015  . H/O unilateral nephrectomy 07/06/2015  . Paroxysmal SVT (supraventricular tachycardia) ([place]) 06/22/2014  . Complications due to renal dialysis device, implant, and graft 05/11/2014  . Acute blood loss anemia 03/31/2014  . Renal dialysis device, implant, or graft complication 23/55/7322  . Acute hyperkalemia    . S/P liver transplant ([place])    .  Chronic hepatitis C without hepatic coma ([place])    . Pulmonary edema 01/03/2014  . Respiratory failure ([place]) 01/03/2014  . Long term current use of anticoagulant therapy 04/22/2013  . Chronic anticoagulation w/ coumadin, goal INR 2.5-3.0 w/ AVR 04/14/2013  . Tobacco abuse 02/28/2013  . COPD (chronic obstructive pulmonary disease) ([place]) 02/21/2013  . Chronic combined systolic and diastolic congestive heart failure ([place]) 02/06/2013  . Acute respiratory failure ([place]) 11/02/2012  . Acute pulmonary edema ([place]) 11/02/2012  . Acute on chronic systolic CHF (congestive heart failure) ([place]) 11/02/2012  . Dyslipidemia-LDL 104, not on statin with Hx of liver transplant 07/30/2012  . CAD (coronary artery disease),  native coronary artery 07/27/2012  . History of prosthetic aortic valve replacement    . End stage renal disease on dialysis ([place]) 02/27/2011  . Hypothyroidism 06/25/2006  . Type 2 diabetes mellitus with chronic kidney disease ([place]) 06/25/2006  . GERD 06/25/2006  . Anemia due to chronic renal failure     . Hypertensive heart disease        Expected Discharge Date: Expected Discharge Date: 07/24/16   Team Members Present: Physician leading conference: Dr. Delice Lesch Social Worker Present: Lennart Pall, LCSW Nurse Present: Dorien Chihuahua, RN PT Present: Barrie Folk, PT OT Present: Napoleon Form, OT SLP Present: Stormy Fabian, SLP PPS Coordinator present : Daiva Nakayama, RN, CRRN       Current Status/Progress Goal Weekly Team Focus  Medical     home pending therapeutic INR  prepare medically for discharge  graft placement, hep/coumadin   Bowel/Bladder     Patient HD, Anuric, Continent of Bowel. LBM 07/19/16  Patient to remain continent of bowel while on IPR  Continue to assess bowel function q shift and PRN   Swallow/Nutrition/ Hydration               ADL's     Supervision overall, occasional min A when fatigued/ pain  Supervision overall  family education, d/c planning   Mobility     Supervsion assist WC mobility, bed mobility, and transfers.   Mod I bed mobility, min-supervision assist from transfers, supervision assist WC mobility in home envirnment.   dynamic balance, improved safety and independence with transfers and WC mobility in various environments.    Communication               Safety/Cognition/ Behavioral Observations             Pain     Patient c/o pain to R BKA site 7-8/10.  Patient received oxy IR q 4 hours prn.  Patient to have pain score <3 while on IPR  Assess pain q 4-6 hours and PRN   Skin     Patient with staples to RLE stump site. Patient w/ dressing to LLE stump w/ hydrogel in place.  Patient to not have any new skin breakdown while on IPR  Continue to  assess skin q shift and PRN.     Rehab Goals Patient on target to meet rehab goals: Yes *See Care Plan and progress notes for long and short-term goals.   Barriers to Discharge: awaiting therapeutic INR     Possible Resolutions to Barriers:  see above     Discharge Planning/Teaching Needs:  HOme with daughter's assisting with her care as prior to admission. pt was here last month knows the process.      Team Discussion:  Pt ready for d/c tomorrow.  Daughters have completed family ed and are able to do  dsg changes and Lovenox inj.    Revisions to Treatment Plan:  None    Continued Need for Acute Rehabilitation Level of Care: The patient requires daily medical management by a physician with specialized training in physical medicine and rehabilitation for the following conditions: Daily direction of a multidisciplinary physical rehabilitation program to ensure safe treatment while eliciting the highest outcome that is of practical value to the patient.: Yes Daily medical management of patient stability for increased activity during participation in an intensive rehabilitation regime.: Yes Daily analysis of laboratory values and/or radiology reports with any subsequent need for medication adjustment of medical intervention for : Post surgical problems;Neurological problems   Kena Limon 07/24/2016, 10:17 AM

## 2016-07-24 NOTE — Patient Care Conference (Signed)
Inpatient RehabilitationTeam Conference and Plan of Care Update Date: 07/23/2016   Time: 10:45 AM    Patient Name: [name]      Medical Record Number: [id number]  Date of Birth: 12/17/1955 Sex: Female         Room/Bed: 4W23C/4W23C-01 Payor Info: Payor: MEDICARE / Plan: MEDICARE PART A AND B / Product Type: *No Product type* /    Admitting Diagnosis: BKA ESRD End Stage Renal Disease N18.6  Admit Date/Time:  07/10/2016  4:27 PM Admission Comments: No comment available   Primary Diagnosis:  Amputation of right lower extremity below knee [facility]) Principal Problem: Amputation of right lower extremity below knee [facility])  Patient Active Problem List   Diagnosis Date Noted  . SOB (shortness of breath)   . Type 2 diabetes mellitus with peripheral neuropathy (HCC)   . H/O mitral valve replacement with mechanical valve   . Amputation of right lower extremity below knee ([place]) 07/10/2016  . History of liver transplant ([place])   . Post-operative pain   . Unilateral complete BKA, right, subsequent encounter ([place])   . S/P bilateral BKA (below knee amputation) ([place])   . Malnutrition of moderate degree 07/01/2016  . Cellulitis 06/30/2016  . PAD (peripheral artery disease) ([place]) 06/30/2016  . Diabetic foot infection ([place]) 06/30/2016  . Pressure injury of skin 05/28/2016  . Volume overload 05/28/2016  . Acute exacerbation of CHF (congestive heart failure) ([place]) 05/27/2016  . Extremity atherosclerosis with resting pain ([place])   . H/O heart valve replacement with mechanical valve   . Phantom limb pain ([place])   . Subtherapeutic international normalized ratio (INR)   . Supratherapeutic INR   . Anemia of chronic disease   . Chronic midline low back pain without sciatica   . ESRD on dialysis ([place])   . Type 2 diabetes mellitus with complication, with long-term current use of insulin ([place])   . S/P unilateral BKA (below knee amputation), left ([place]) 04/30/2016  . Unilateral complete BKA, left, initial  encounter ([place]) 04/30/2016  . Abnormality of gait   . Coronary artery disease involving coronary bypass graft of native heart without angina pectoris   . Benign essential HTN   . History of migraine   . Leukocytosis   . Critical lower limb ischemia 04/22/2016  . Foot pain 04/21/2016  . Fever   . Advance care planning   . Goals of care, counseling/discussion   . Palliative care by specialist   . Sepsis ([place])   . Hypotension   . Claudication of left lower extremity ([place])   . Elevated troponin 03/05/2016  . Pressure injury of skin, stage 2 11/27/2015  . H/O unilateral nephrectomy 07/06/2015  . Paroxysmal SVT (supraventricular tachycardia) ([place]) 06/22/2014  . Complications due to renal dialysis device, implant, and graft 05/11/2014  . Acute blood loss anemia 03/31/2014  . Renal dialysis device, implant, or graft complication 46/27/0350  . Acute hyperkalemia   . S/P liver transplant ([place])   . Chronic hepatitis C without hepatic coma ([place])   . Pulmonary edema 01/03/2014  . Respiratory failure ([place]) 01/03/2014  . Long term current use of anticoagulant therapy 04/22/2013  . Chronic anticoagulation w/ coumadin, goal INR 2.5-3.0 w/ AVR 04/14/2013  . Tobacco abuse 02/28/2013  . COPD (chronic obstructive pulmonary disease) ([place]) 02/21/2013  . Chronic combined systolic and diastolic congestive heart failure ([place]) 02/06/2013  . Acute respiratory failure ([place]) 11/02/2012  . Acute pulmonary edema ([place]) 11/02/2012  . Acute on chronic systolic CHF (congestive heart  failure) ([place]) 11/02/2012  . Dyslipidemia-LDL 104, not on statin with Hx of liver transplant 07/30/2012  . CAD (coronary artery disease), native coronary artery 07/27/2012  . History of prosthetic aortic valve replacement   . End stage renal disease on dialysis ([place]) 02/27/2011  . Hypothyroidism 06/25/2006  . Type 2 diabetes mellitus with chronic kidney disease ([place]) 06/25/2006  . GERD 06/25/2006  . Anemia due to chronic renal  failure    . Hypertensive heart disease     Expected Discharge Date: Expected Discharge Date: 07/24/16  Team Members Present: Physician leading conference: Dr. Delice Lesch Social Worker Present: Lennart Pall, LCSW Nurse Present: Dorien Chihuahua, RN PT Present: Barrie Folk, PT OT Present: Napoleon Form, OT SLP Present: Stormy Fabian, SLP PPS Coordinator present : Daiva Nakayama, RN, CRRN     Current Status/Progress Goal Weekly Team Focus  Medical   home pending therapeutic INR  prepare medically for discharge  graft placement, hep/coumadin   Bowel/Bladder   Patient HD, Anuric, Continent of Bowel. LBM 07/19/16  Patient to remain continent of bowel while on IPR  Continue to assess bowel function q shift and PRN   Swallow/Nutrition/ Hydration             ADL's   Supervision overall, occasional min A when fatigued/ pain  Supervision overall  family education, d/c planning   Mobility   Supervsion assist WC mobility, bed mobility, and transfers.   Mod I bed mobility, min-supervision assist from transfers, supervision assist WC mobility in home envirnment.   dynamic balance, improved safety and independence with transfers and WC mobility in various environments.    Communication             Safety/Cognition/ Behavioral Observations            Pain   Patient c/o pain to R BKA site 7-8/10.  Patient received oxy IR q 4 hours prn.  Patient to have pain score <3 while on IPR  Assess pain q 4-6 hours and PRN   Skin   Patient with staples to RLE stump site. Patient w/ dressing to LLE stump w/ hydrogel in place.  Patient to not have any new skin breakdown while on IPR  Continue to assess skin q shift and PRN.    Rehab Goals Patient on target to meet rehab goals: Yes *See Care Plan and progress notes for long and short-term goals.  Barriers to Discharge: awaiting therapeutic INR    Possible Resolutions to Barriers:  see above    Discharge Planning/Teaching Needs:  HOme with daughter's  assisting with her care as prior to admission. pt was here last month knows the process.      Team Discussion:  Pt ready for d/c tomorrow.  Daughters have completed family ed and are able to do dsg changes and Lovenox inj.    Revisions to Treatment Plan:  None   Continued Need for Acute Rehabilitation Level of Care: The patient requires daily medical management by a physician with specialized training in physical medicine and rehabilitation for the following conditions: Daily direction of a multidisciplinary physical rehabilitation program to ensure safe treatment while eliciting the highest outcome that is of practical value to the patient.: Yes Daily medical management of patient stability for increased activity during participation in an intensive rehabilitation regime.: Yes Daily analysis of laboratory values and/or radiology reports with any subsequent need for medication adjustment of medical intervention for : Post surgical problems;Neurological problems  Bryna Razavi 07/24/2016, 10:17 AM

## 2016-07-25 ENCOUNTER — Encounter: Payer: Self-pay | Admitting: Family

## 2016-07-25 DIAGNOSIS — N186 End stage renal disease: Secondary | ICD-10-CM | POA: Diagnosis not present

## 2016-07-25 DIAGNOSIS — E118 Type 2 diabetes mellitus with unspecified complications: Secondary | ICD-10-CM | POA: Diagnosis not present

## 2016-07-25 DIAGNOSIS — N2581 Secondary hyperparathyroidism of renal origin: Secondary | ICD-10-CM | POA: Diagnosis not present

## 2016-07-25 DIAGNOSIS — E876 Hypokalemia: Secondary | ICD-10-CM | POA: Diagnosis not present

## 2016-07-25 DIAGNOSIS — D631 Anemia in chronic kidney disease: Secondary | ICD-10-CM | POA: Diagnosis not present

## 2016-07-27 DIAGNOSIS — Z89512 Acquired absence of left leg below knee: Secondary | ICD-10-CM | POA: Diagnosis not present

## 2016-07-27 DIAGNOSIS — Z4781 Encounter for orthopedic aftercare following surgical amputation: Secondary | ICD-10-CM | POA: Diagnosis not present

## 2016-07-27 DIAGNOSIS — N186 End stage renal disease: Secondary | ICD-10-CM | POA: Diagnosis not present

## 2016-07-27 DIAGNOSIS — F419 Anxiety disorder, unspecified: Secondary | ICD-10-CM | POA: Diagnosis not present

## 2016-07-27 DIAGNOSIS — F329 Major depressive disorder, single episode, unspecified: Secondary | ICD-10-CM | POA: Diagnosis not present

## 2016-07-27 DIAGNOSIS — G546 Phantom limb syndrome with pain: Secondary | ICD-10-CM | POA: Diagnosis not present

## 2016-07-27 DIAGNOSIS — Z89511 Acquired absence of right leg below knee: Secondary | ICD-10-CM | POA: Diagnosis not present

## 2016-07-27 DIAGNOSIS — E1122 Type 2 diabetes mellitus with diabetic chronic kidney disease: Secondary | ICD-10-CM | POA: Diagnosis not present

## 2016-07-27 DIAGNOSIS — J449 Chronic obstructive pulmonary disease, unspecified: Secondary | ICD-10-CM | POA: Diagnosis not present

## 2016-07-27 DIAGNOSIS — Z7982 Long term (current) use of aspirin: Secondary | ICD-10-CM | POA: Diagnosis not present

## 2016-07-27 DIAGNOSIS — Z794 Long term (current) use of insulin: Secondary | ICD-10-CM | POA: Diagnosis not present

## 2016-07-27 DIAGNOSIS — Z7901 Long term (current) use of anticoagulants: Secondary | ICD-10-CM | POA: Diagnosis not present

## 2016-07-27 DIAGNOSIS — D631 Anemia in chronic kidney disease: Secondary | ICD-10-CM | POA: Diagnosis not present

## 2016-07-27 DIAGNOSIS — E1151 Type 2 diabetes mellitus with diabetic peripheral angiopathy without gangrene: Secondary | ICD-10-CM | POA: Diagnosis not present

## 2016-07-27 DIAGNOSIS — Z944 Liver transplant status: Secondary | ICD-10-CM | POA: Diagnosis not present

## 2016-07-27 DIAGNOSIS — I132 Hypertensive heart and chronic kidney disease with heart failure and with stage 5 chronic kidney disease, or end stage renal disease: Secondary | ICD-10-CM | POA: Diagnosis not present

## 2016-07-27 DIAGNOSIS — Z952 Presence of prosthetic heart valve: Secondary | ICD-10-CM | POA: Diagnosis not present

## 2016-07-27 DIAGNOSIS — I2510 Atherosclerotic heart disease of native coronary artery without angina pectoris: Secondary | ICD-10-CM | POA: Diagnosis not present

## 2016-07-27 DIAGNOSIS — M19042 Primary osteoarthritis, left hand: Secondary | ICD-10-CM | POA: Diagnosis not present

## 2016-07-27 DIAGNOSIS — E1142 Type 2 diabetes mellitus with diabetic polyneuropathy: Secondary | ICD-10-CM | POA: Diagnosis not present

## 2016-07-27 DIAGNOSIS — Z992 Dependence on renal dialysis: Secondary | ICD-10-CM | POA: Diagnosis not present

## 2016-07-27 DIAGNOSIS — Z993 Dependence on wheelchair: Secondary | ICD-10-CM | POA: Diagnosis not present

## 2016-07-27 DIAGNOSIS — I5042 Chronic combined systolic (congestive) and diastolic (congestive) heart failure: Secondary | ICD-10-CM | POA: Diagnosis not present

## 2016-07-27 DIAGNOSIS — F17210 Nicotine dependence, cigarettes, uncomplicated: Secondary | ICD-10-CM | POA: Diagnosis not present

## 2016-07-27 DIAGNOSIS — L89152 Pressure ulcer of sacral region, stage 2: Secondary | ICD-10-CM | POA: Diagnosis not present

## 2016-07-28 DIAGNOSIS — E876 Hypokalemia: Secondary | ICD-10-CM | POA: Diagnosis not present

## 2016-07-28 DIAGNOSIS — I132 Hypertensive heart and chronic kidney disease with heart failure and with stage 5 chronic kidney disease, or end stage renal disease: Secondary | ICD-10-CM | POA: Diagnosis not present

## 2016-07-28 DIAGNOSIS — N2581 Secondary hyperparathyroidism of renal origin: Secondary | ICD-10-CM | POA: Diagnosis not present

## 2016-07-28 DIAGNOSIS — N186 End stage renal disease: Secondary | ICD-10-CM | POA: Diagnosis not present

## 2016-07-28 DIAGNOSIS — L89152 Pressure ulcer of sacral region, stage 2: Secondary | ICD-10-CM | POA: Diagnosis not present

## 2016-07-28 DIAGNOSIS — I5042 Chronic combined systolic (congestive) and diastolic (congestive) heart failure: Secondary | ICD-10-CM | POA: Diagnosis not present

## 2016-07-28 DIAGNOSIS — D631 Anemia in chronic kidney disease: Secondary | ICD-10-CM | POA: Diagnosis not present

## 2016-07-28 DIAGNOSIS — Z4781 Encounter for orthopedic aftercare following surgical amputation: Secondary | ICD-10-CM | POA: Diagnosis not present

## 2016-07-28 DIAGNOSIS — E118 Type 2 diabetes mellitus with unspecified complications: Secondary | ICD-10-CM | POA: Diagnosis not present

## 2016-07-28 DIAGNOSIS — E1151 Type 2 diabetes mellitus with diabetic peripheral angiopathy without gangrene: Secondary | ICD-10-CM | POA: Diagnosis not present

## 2016-07-28 DIAGNOSIS — E1122 Type 2 diabetes mellitus with diabetic chronic kidney disease: Secondary | ICD-10-CM | POA: Diagnosis not present

## 2016-07-29 DIAGNOSIS — I5042 Chronic combined systolic (congestive) and diastolic (congestive) heart failure: Secondary | ICD-10-CM | POA: Diagnosis not present

## 2016-07-29 DIAGNOSIS — I132 Hypertensive heart and chronic kidney disease with heart failure and with stage 5 chronic kidney disease, or end stage renal disease: Secondary | ICD-10-CM | POA: Diagnosis not present

## 2016-07-29 DIAGNOSIS — E1122 Type 2 diabetes mellitus with diabetic chronic kidney disease: Secondary | ICD-10-CM | POA: Diagnosis not present

## 2016-07-29 DIAGNOSIS — Z4781 Encounter for orthopedic aftercare following surgical amputation: Secondary | ICD-10-CM | POA: Diagnosis not present

## 2016-07-29 DIAGNOSIS — L89152 Pressure ulcer of sacral region, stage 2: Secondary | ICD-10-CM | POA: Diagnosis not present

## 2016-07-29 DIAGNOSIS — E1151 Type 2 diabetes mellitus with diabetic peripheral angiopathy without gangrene: Secondary | ICD-10-CM | POA: Diagnosis not present

## 2016-07-29 LAB — PROTIME-INR: INR: 3.5 — AB (ref <=1.1)

## 2016-07-30 DIAGNOSIS — E118 Type 2 diabetes mellitus with unspecified complications: Secondary | ICD-10-CM | POA: Diagnosis not present

## 2016-07-30 DIAGNOSIS — N2581 Secondary hyperparathyroidism of renal origin: Secondary | ICD-10-CM | POA: Diagnosis not present

## 2016-07-30 DIAGNOSIS — N186 End stage renal disease: Secondary | ICD-10-CM | POA: Diagnosis not present

## 2016-07-30 DIAGNOSIS — D631 Anemia in chronic kidney disease: Secondary | ICD-10-CM | POA: Diagnosis not present

## 2016-07-30 DIAGNOSIS — E876 Hypokalemia: Secondary | ICD-10-CM | POA: Diagnosis not present

## 2016-07-31 ENCOUNTER — Ambulatory Visit (INDEPENDENT_AMBULATORY_CARE_PROVIDER_SITE_OTHER): Payer: Medicare Other | Admitting: Pharmacist

## 2016-07-31 ENCOUNTER — Inpatient Hospital Stay (HOSPITAL_COMMUNITY)
Admission: AD | Admit: 2016-07-31 | Discharge: 2016-08-02 | DRG: 640 | Disposition: A | Discharge status: Home Health | Payer: Medicare Other | Source: Other Acute Inpatient Hospital | Attending: Internal Medicine | Admitting: Internal Medicine

## 2016-07-31 ENCOUNTER — Encounter (HOSPITAL_COMMUNITY): Payer: Self-pay | Admitting: *Deleted

## 2016-07-31 DIAGNOSIS — Z992 Dependence on renal dialysis: Secondary | ICD-10-CM

## 2016-07-31 DIAGNOSIS — Z944 Liver transplant status: Secondary | ICD-10-CM | POA: Diagnosis not present

## 2016-07-31 DIAGNOSIS — Z89511 Acquired absence of right leg below knee: Secondary | ICD-10-CM | POA: Diagnosis not present

## 2016-07-31 DIAGNOSIS — E1122 Type 2 diabetes mellitus with diabetic chronic kidney disease: Secondary | ICD-10-CM

## 2016-07-31 DIAGNOSIS — I5042 Chronic combined systolic (congestive) and diastolic (congestive) heart failure: Secondary | ICD-10-CM | POA: Diagnosis not present

## 2016-07-31 DIAGNOSIS — I2510 Atherosclerotic heart disease of native coronary artery without angina pectoris: Secondary | ICD-10-CM | POA: Diagnosis not present

## 2016-07-31 DIAGNOSIS — M549 Dorsalgia, unspecified: Secondary | ICD-10-CM | POA: Diagnosis not present

## 2016-07-31 DIAGNOSIS — Z952 Presence of prosthetic heart valve: Secondary | ICD-10-CM | POA: Diagnosis not present

## 2016-07-31 DIAGNOSIS — Z7901 Long term (current) use of anticoagulants: Secondary | ICD-10-CM

## 2016-07-31 DIAGNOSIS — Z4781 Encounter for orthopedic aftercare following surgical amputation: Secondary | ICD-10-CM | POA: Diagnosis not present

## 2016-07-31 DIAGNOSIS — L89152 Pressure ulcer of sacral region, stage 2: Secondary | ICD-10-CM | POA: Diagnosis not present

## 2016-07-31 DIAGNOSIS — Z794 Long term (current) use of insulin: Secondary | ICD-10-CM

## 2016-07-31 DIAGNOSIS — E039 Hypothyroidism, unspecified: Secondary | ICD-10-CM | POA: Diagnosis not present

## 2016-07-31 DIAGNOSIS — N186 End stage renal disease: Secondary | ICD-10-CM | POA: Diagnosis not present

## 2016-07-31 DIAGNOSIS — I5023 Acute on chronic systolic (congestive) heart failure: Secondary | ICD-10-CM | POA: Diagnosis not present

## 2016-07-31 DIAGNOSIS — J449 Chronic obstructive pulmonary disease, unspecified: Secondary | ICD-10-CM | POA: Diagnosis not present

## 2016-07-31 DIAGNOSIS — I5043 Acute on chronic combined systolic (congestive) and diastolic (congestive) heart failure: Secondary | ICD-10-CM | POA: Diagnosis not present

## 2016-07-31 DIAGNOSIS — E8770 Fluid overload, unspecified: Secondary | ICD-10-CM | POA: Diagnosis not present

## 2016-07-31 DIAGNOSIS — J9601 Acute respiratory failure with hypoxia: Secondary | ICD-10-CM | POA: Diagnosis not present

## 2016-07-31 DIAGNOSIS — E8889 Other specified metabolic disorders: Secondary | ICD-10-CM | POA: Diagnosis not present

## 2016-07-31 DIAGNOSIS — I132 Hypertensive heart and chronic kidney disease with heart failure and with stage 5 chronic kidney disease, or end stage renal disease: Secondary | ICD-10-CM | POA: Diagnosis not present

## 2016-07-31 DIAGNOSIS — Z89512 Acquired absence of left leg below knee: Secondary | ICD-10-CM | POA: Diagnosis not present

## 2016-07-31 DIAGNOSIS — D631 Anemia in chronic kidney disease: Secondary | ICD-10-CM | POA: Diagnosis not present

## 2016-07-31 DIAGNOSIS — R0602 Shortness of breath: Secondary | ICD-10-CM | POA: Diagnosis not present

## 2016-07-31 DIAGNOSIS — J9621 Acute and chronic respiratory failure with hypoxia: Secondary | ICD-10-CM | POA: Diagnosis present

## 2016-07-31 DIAGNOSIS — E1121 Type 2 diabetes mellitus with diabetic nephropathy: Secondary | ICD-10-CM | POA: Diagnosis not present

## 2016-07-31 DIAGNOSIS — J810 Acute pulmonary edema: Secondary | ICD-10-CM | POA: Diagnosis not present

## 2016-07-31 DIAGNOSIS — G8929 Other chronic pain: Secondary | ICD-10-CM | POA: Diagnosis present

## 2016-07-31 DIAGNOSIS — I248 Other forms of acute ischemic heart disease: Secondary | ICD-10-CM | POA: Diagnosis not present

## 2016-07-31 DIAGNOSIS — N2581 Secondary hyperparathyroidism of renal origin: Secondary | ICD-10-CM | POA: Diagnosis not present

## 2016-07-31 DIAGNOSIS — E1151 Type 2 diabetes mellitus with diabetic peripheral angiopathy without gangrene: Secondary | ICD-10-CM | POA: Diagnosis present

## 2016-07-31 LAB — GLUCOSE, CAPILLARY: GLUCOSE-CAPILLARY: 113 mg/dL — AB (ref 65–99)

## 2016-07-31 MED ORDER — IPRATROPIUM-ALBUTEROL 0.5-2.5 (3) MG/3ML IN SOLN: 3.0000 mL | RESPIRATORY_TRACT | Status: DC | PRN

## 2016-07-31 MED ORDER — CALCIUM ACETATE (PHOS BINDER) 667 MG PO CAPS
667.0000 mg | ORAL_CAPSULE | Freq: Three times a day (TID) | ORAL | Status: DC
Start: 2016-08-01 — End: 2016-08-02
  Administered 2016-08-01 – 2016-08-02 (×4): 667 mg via ORAL
  Filled 2016-07-31 (×5): qty 1

## 2016-07-31 MED ORDER — ONDANSETRON HCL 4 MG/2ML IJ SOLN
4.0000 mg | Freq: Four times a day (QID) | INTRAMUSCULAR | Status: DC | PRN
  Administered 2016-08-01: 4 mg via INTRAVENOUS
  Filled 2016-07-31: qty 2

## 2016-07-31 MED ORDER — METOPROLOL TARTRATE 12.5 MG HALF TABLET
12.5000 mg | ORAL_TABLET | Freq: Two times a day (BID) | ORAL | Status: DC
  Administered 2016-08-01 – 2016-08-02 (×3): 12.5 mg via ORAL
  Filled 2016-07-31 (×3): qty 1

## 2016-07-31 MED ORDER — CALCITRIOL 0.5 MCG PO CAPS
0.5000 ug | ORAL_CAPSULE | ORAL | Status: DC
  Administered 2016-08-02: 0.5 ug via ORAL

## 2016-07-31 MED ORDER — LEVOTHYROXINE SODIUM 50 MCG PO TABS
175.0000 ug | ORAL_TABLET | Freq: Every day | ORAL | Status: DC
  Administered 2016-08-01 – 2016-08-02 (×2): 175 ug via ORAL
  Filled 2016-07-31 (×2): qty 1

## 2016-07-31 MED ORDER — PANTOPRAZOLE SODIUM 40 MG PO TBEC
40.0000 mg | DELAYED_RELEASE_TABLET | Freq: Every day | ORAL | Status: DC
  Administered 2016-08-01 – 2016-08-02 (×2): 40 mg via ORAL
  Filled 2016-07-31 (×2): qty 1

## 2016-07-31 MED ORDER — GABAPENTIN 300 MG PO CAPS
300.0000 mg | ORAL_CAPSULE | Freq: Every day | ORAL | Status: DC
  Administered 2016-08-01: 300 mg via ORAL
  Filled 2016-07-31: qty 1

## 2016-07-31 MED ORDER — CALCIUM CARBONATE ANTACID 500 MG PO CHEW
1.0000 | CHEWABLE_TABLET | Freq: Every day | ORAL | Status: DC
  Administered 2016-08-01 – 2016-08-02 (×2): 200 mg via ORAL
  Filled 2016-07-31 (×2): qty 1

## 2016-07-31 MED ORDER — NITROGLYCERIN 0.4 MG SL SUBL: 0.4000 mg | SUBLINGUAL_TABLET | SUBLINGUAL | Status: DC | PRN

## 2016-07-31 MED ORDER — ONDANSETRON HCL 4 MG PO TABS: 4.0000 mg | ORAL_TABLET | Freq: Four times a day (QID) | ORAL | Status: DC | PRN

## 2016-07-31 MED ORDER — OXYCODONE HCL 5 MG PO TABS
10.0000 mg | ORAL_TABLET | ORAL | Status: DC | PRN
  Administered 2016-08-01 – 2016-08-02 (×7): 10 mg via ORAL
  Filled 2016-07-31 (×8): qty 2

## 2016-07-31 MED ORDER — ORAL CARE MOUTH RINSE: 15.0000 mL | Freq: Two times a day (BID) | OROMUCOSAL | Status: DC

## 2016-07-31 MED ORDER — TACROLIMUS 1 MG PO CAPS
2.0000 mg | ORAL_CAPSULE | Freq: Two times a day (BID) | ORAL | Status: DC
  Administered 2016-08-01 – 2016-08-02 (×3): 2 mg via ORAL
  Filled 2016-07-31 (×3): qty 2

## 2016-07-31 MED ORDER — RENA-VITE PO TABS
1.0000 | ORAL_TABLET | Freq: Every day | ORAL | Status: DC
  Administered 2016-08-01: 1 via ORAL
  Filled 2016-07-31: qty 1

## 2016-07-31 MED ORDER — POLYETHYLENE GLYCOL 3350 17 G PO PACK: 17.0000 g | PACK | Freq: Two times a day (BID) | ORAL | Status: DC | PRN

## 2016-07-31 MED ORDER — ASPIRIN EC 81 MG PO TBEC
81.0000 mg | DELAYED_RELEASE_TABLET | Freq: Every day | ORAL | Status: DC
  Administered 2016-08-01 – 2016-08-02 (×2): 81 mg via ORAL
  Filled 2016-07-31 (×2): qty 1

## 2016-07-31 NOTE — Consult Note (Signed)
Reason for Consult: Volume overload/respiratory distress in patient with end-stage renal disease Referring Physician: Myrene Buddy M.D. [facility])  HPI:  61 year old African-American woman with multiple medical problems including hypertension, diabetes mellitus, coronary artery disease, aortic valve replacement with mechanical valve on chronic warfarin anticoagulation, history of liver transplant, peripheral vascular disease status post bilateral below knee amputation and end-stage renal disease on hemodialysis on a Monday/Wednesday/Friday schedule. Only recently discharged from the hospital after admission for cellulitis/right foot wound requiring BKA and a stay at CIR thereafter. She states that she went to her regular dialysis treatment yesterday without problems. Later this afternoon, developed increasing shortness of breath prompting presentation to the emergency room where she was found to have clinical and radiological evidence of pulmonary edema/respiratory distress/hypoxia and was transferred to [facility] for emergent hemodialysis. She denies any fever/chills/productive cough. She reports that she has been losing weight since her admission to the hospital and has been struggling with appetite/weight gain. She recently had the first stage stage of left brachiobasilic fistula creation.  Dialysis prescription: Monday/Wednesday/Friday, [facility], 3-1/2 hours, 180 dialyzer, BFR 450/dialysate flow AF 1.5, 3K/2.25 calcium, no UF profile, linear sodium modeling, Mircera 75 g every 2 weeks (last dose 6/15), calcitriol 0.5 g 3 days a week @ HD   Past Medical History:  Diagnosis Date  . Anemia    takes Folic Acid daily  . Anxiety   . Aortic stenosis   . Arthritis    "left hand, back" (08/30/2012)  . Asthma   . CAD (coronary artery disease)   . CAD (coronary artery disease) Jan. 2015   Cath: 20% LAD, 50% D1; s/p LIMA-LAD  . Chest pain 03/06/2015  . CHF (congestive heart failure)  ([place]) 05/2016  . Chronic back pain   . Chronic bronchitis ([place])    "q yr w/season changes" (08/30/2012)  . Chronic constipation    takes MIralax and Colace daily  . COPD (chronic obstructive pulmonary disease) ([place])   . Depression    takes Cymbalta for "severe" depression  . End stage renal disease on dialysis [facility]) 02/27/2011   Kidneys shut down at time of liver transplant in Sept 2011 at [facility] in [place], she has been on HD ever since.  Dialyzes at [facility] on TTS schedule.  Had L forearm graft used 10 months then removed Dec 2012 due to suspected infection.  A right upper arm AVG was placed Dec 2012 but she developed steal symptoms acutely and it was ligated the same day.  Never had an AV fistula due to small veins.  Now has L thigh AVG put in Jan 2013, has not clotted to date.    [name] GERD (gastroesophageal reflux disease)    takes Omeprazole daily  . Headache    "at least monthly" (08/30/2012)  . Hepatitis C   . History of blood transfusion    "several" (08/30/2012)  . Hypertension    takes Metoprolol and Lisinopril daily, sees Dr Bea Graff  . Hypothyroidism    takes Synthroid daily  . Migraine    "last migraine was in 2013" (08/30/2012)  . Neuromuscular disorder ([place])    carpal tunnel in right hand  . Obesity   . Peripheral vascular disease (HCC) hands and legs  . Pneumonia    "today and several times before" (08/30/2012)  . 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 S/P unilateral BKA (below knee amputation), left ([place]) 04/30/2016  . SVT (supraventricular tachycardia) ([place]) 06/09/14  . Tobacco abuse   . Type II diabetes mellitus (HCC)    Levemir 2units daily if > 150    Past Surgical History:  Procedure Laterality Date  . ABDOMINAL AORTOGRAM W/LOWER EXTREMITY N/A 07/04/2016   Procedure: Abdominal Aortogram w/Lower Extremity;  Surgeon: Elam Dutch, MD;  Location: [place] CV LAB;  Service: Cardiovascular;  Laterality: N/A;  . AMPUTATION Left 04/24/2016   Procedure: AMPUTATION BELOW KNEE;  Surgeon: Angelia Mould, MD;  Location: [place];  Service: Vascular;  Laterality: Left;  . AMPUTATION Right 07/08/2016   Procedure: AMPUTATION BELOW KNEE;  Surgeon: Angelia Mould, MD;  Location: [place];  Service: Vascular;  Laterality: Right;  . AORTIC VALVE REPLACEMENT N/A 03/15/2013   AVR; Surgeon: Ivin Poot, MD;  Location: [facility] OR; Open Heart Surgery;  61mm[employer] mechanical prosthesis, top hat valve  . ARTERIOVENOUS GRAFT PLACEMENT Left 10/03/10    forearm  . AV FISTULA PLACEMENT  01/29/2011   Procedure: INSERTION OF ARTERIOVENOUS (AV) GORE-TEX GRAFT ARM;  Surgeon: Elam Dutch, MD;  Location: [facility] OR;  Service: Vascular;  Laterality: Right;  . AV FISTULA PLACEMENT  03/10/2011   Procedure: INSERTION OF ARTERIOVENOUS (AV) GORE-TEX GRAFT THIGH;  Surgeon: Elam Dutch, MD;  Location: [place];  Service: Vascular;  Laterality: Left;  . AV FISTULA PLACEMENT Left 07/21/2016   Procedure: CREATION OF LEFT UPPER ARM ARTERIOVENOUS (AV) FISTULA;  Surgeon: Conrad McKinnon, MD;  Location: [place];  Service: Vascular;  Laterality: Left;  . [place] REMOVAL  12/23/2010   Procedure: REMOVAL OF ARTERIOVENOUS GORETEX GRAFT ([place]);  Surgeon: Elam Dutch, MD;  Location: [place];  Service: Vascular;  Laterality: Left;  procedure started @1736 -7673  . CHOLECYSTECTOMY  1993  . CORONARY ARTERY BYPASS GRAFT N/A 03/15/2013   Procedure: CORONARY ARTERY BYPASS GRAFTING (CABG) times one using left internal mammary artery.;  Surgeon: Ivin Poot, MD;  Location: [place];  Service: Open Heart Surgery;  Laterality: N/A;  POSS CABG X 1  . CYSTOSCOPY  1990's  . INSERTION OF DIALYSIS CATHETER  12/23/2010   Procedure: INSERTION OF DIALYSIS CATHETER;  Surgeon: Elam Dutch, MD;  Location: [place];  Service: Vascular;  Laterality: Right;  Right Internal  Jugular 28cm dialysis catheter insertion procedure time 1701-1720   . INSERTION OF DIALYSIS CATHETER Right 03/11/2016   Procedure: INSERTION OF DIALYSIS CATHETER;  Surgeon: Angelia Mould, MD;  Location: [place];  Service: Vascular;  Laterality: Right;  . INTRAOPERATIVE TRANSESOPHAGEAL ECHOCARDIOGRAM N/A 03/15/2013   Procedure: INTRAOPERATIVE TRANSESOPHAGEAL ECHOCARDIOGRAM;  Surgeon: Ivin Poot, MD;  Location: [place];  Service: Open Heart Surgery;  Laterality: N/A;  . LEFT HEART CATHETERIZATION WITH CORONARY ANGIOGRAM N/A 07/29/2012   Procedure: LEFT HEART CATHETERIZATION WITH CORONARY ANGIOGRAM;  Surgeon: Troy Sine, MD;  Location: [facility] CATH LAB;  Service: Cardiovascular;  Laterality: N/A;  . LEFT HEART CATHETERIZATION WITH CORONARY ANGIOGRAM N/A 03/10/2013   Procedure: LEFT HEART CATHETERIZATION WITH CORONARY ANGIOGRAM;  Surgeon: Troy Sine, MD;  Location: [facility] C[facility] LAB;  Service: Cardiovascular;  Laterality: N/A;  . LEFT HEART CATHETERIZATION WITH CORONARY/GRAFT ANGIOGRAM N/A 12/24/2011   Procedure: LEFT HEART CATHETERIZATION WITH Beatrix Fetters;  Surgeon: [name], MD;  Location: [facility] CATH LAB;  Service: Cardiovascular;  Laterality: N/A;  . LEFT HEART CATHETERIZATION WITH CORONARY/GRAFT ANGIOGRAM N/A 12/16/2013  Procedure: LEFT HEART CATHETERIZATION WITH Beatrix Fetters;  Surgeon: Troy Sine, MD;  Location: [facility] CATH LAB;  Service: Cardiovascular;  Laterality: N/A;  . LIGATION ARTERIOVENOUS GORTEX GRAFT Left 03/11/2016   Procedure: LIGATION THIGH ARTERIOVENOUS GORTEX GRAFT;  Surgeon: Angelia Mould, MD;  Location: [place];  Service: Vascular;  Laterality: Left;  . LIVER TRANSPLANT  10/25/2009   sees Dr Ferol Luz 1 every 6 months, saw last in Dec 2013. [name] Coord [phone]  . PERIPHERAL VASCULAR CATHETERIZATION N/A 11/06/2015   Procedure: Abdominal Aortogram;  Surgeon: Serafina Mitchell, MD;  Location: [place] CV LAB;  Service: Cardiovascular;   Laterality: N/A;  . PERIPHERAL VASCULAR CATHETERIZATION N/A 11/06/2015   Procedure: Lower Extremity Angiography;  Surgeon: Serafina Mitchell, MD;  Location: [place] CV LAB;  Service: Cardiovascular;  Laterality: N/A;  . PERIPHERAL VASCULAR CATHETERIZATION  11/06/2015   Procedure: Peripheral Vascular Atherectomy;  Surgeon: Serafina Mitchell, MD;  Location: [place] CV LAB;  Service: Cardiovascular;;  Left Superficial femoral  . SHUNTOGRAM Left 05/15/2014   Procedure: SHUNTOGRAM;  Surgeon: Conrad Sheppton, MD;  Location: [facility] CATH LAB;  Service: Cardiovascular;  Laterality: Left;  . SMALL INTESTINE SURGERY  90's  . SPINAL GROWTH RODS  2010   "put 2 metal rods in my back; they had detetriorated" (08/30/2012)  . THROMBECTOMY    . THROMBECTOMY AND REVISION OF ARTERIOVENTOUS (AV) GORETEX  GRAFT Left 03/30/2014  . THROMBECTOMY AND REVISION OF ARTERIOVENTOUS (AV) GORETEX  GRAFT Left 03/30/2014   Procedure: THROMBECTOMY AND REVISION OF ARTERIOVENTOUS (AV) GORETEX  GRAFT;  Surgeon: Conrad Piedmont, MD;  Location: [place];  Service: Vascular;  Laterality: Left;  . TUBAL LIGATION  1990's    Family History  Problem Relation Age of Onset  . Cancer Mother   . Diabetes Mother   . Hypertension Mother   . Stroke Mother   . Cancer Father   . Anesthesia problems Neg Hx   . Hypotension Neg Hx   . Malignant hyperthermia Neg Hx   . Pseudochol deficiency Neg Hx     Social History:  reports that she has been smoking Cigarettes.  She has a 20.00 pack-year smoking history. She has never used smokeless tobacco. She reports that she does not drink alcohol or use drugs.  Allergies:  Allergies  Allergen Reactions  . Acetaminophen Other (See Comments)    Liver transplant recipient   . Codeine Itching  . Mirtazapine Other (See Comments)    hallucination    Medications: Reviewed   BMP Latest Ref Rng & Units 07/23/2016 07/20/2016 07/18/2016  Glucose 65 - 99 mg/dL 138(H) 163(H) 262(H)  BUN 6 - 20 mg/dL 9 5(L) 14   Creatinine 0.44 - 1.00 mg/dL 3.08(H) 2.86(H) 4.61(H)  Sodium 135 - 145 mmol/L 134(L) 135 132(L)  Potassium 3.5 - 5.1 mmol/L 4.0 3.5 3.4(L)  Chloride 101 - 111 mmol/L 97(L) 100(L) 96(L)  CO2 22 - 32 mmol/L 26 26 26   Calcium 8.9 - 10.3 mg/dL 8.9 8.8(L) 8.5(L)   CBC Latest Ref Rng & Units 07/23/2016 07/22/2016 07/22/2016  WBC 4.0 - 10.5 K/uL 11.4(H) 11.7(H) 7.3  Hemoglobin 12.0 - 15.0 g/dL 10.3(L) 12.5 17.2(H)  Hematocrit 36.0 - 46.0 % 31.9(L) 37.8 50.8(H)  Platelets 150 - 400 K/uL 248 189 88(L)     No results found.  Review of Systems  Constitutional: Positive for malaise/fatigue and weight loss. Negative for chills and fever.  HENT: Negative.   Eyes: Negative.   Respiratory: Positive for shortness  of breath. Negative for cough, sputum production and wheezing.   Cardiovascular: Positive for orthopnea. Negative for chest pain and palpitations.  Gastrointestinal: Negative.   Genitourinary: Negative.   Musculoskeletal: Negative.   Skin: Negative.    Blood pressure (!) 113/47, pulse 91, temperature 99 F (37.2 C), temperature source Oral, resp. rate 17, weight 49.7 kg (109 lb 9.6 oz), SpO2 100 %. Physical Exam  Nursing note and vitals reviewed. Constitutional: She is oriented to person, place, and time.  Appears uncomfortable and restless  HENT:  Head: Normocephalic and atraumatic.  Mouth/Throat: Oropharynx is clear and moist.  Eyes: Conjunctivae and EOM are normal. Pupils are equal, round, and reactive to light. No scleral icterus.  Neck: Normal range of motion. Neck supple. JVD present.  8-10 cm JVP  Cardiovascular: Normal rate and regular rhythm.   Murmur heard. Respiratory: Effort normal. She has rales.  Fine rales left base  GI: Soft. Bowel sounds are normal. She exhibits no distension. There is no tenderness. There is no rebound.  Musculoskeletal: She exhibits no edema.  Status post bilateral below-knee amputation, first stage left BVT aVF good thrill  Neurological: She  is alert and oriented to person, place, and time.  Skin: Skin is warm and dry. No rash noted.    Assessment/Plan: 1. Volume overload/pulmonary edema: X-ray report reviewed from [facility] and patient examined to corroborate findings. I suspect that with her recent weight loss, estimated dry weight will require to be lowered a little more aggressively. Plan for emergent hemodialysis tonight and then again tomorrow to get her back on her usual outpatient schedule depending on symptomatic assessment. 2. End-stage renal disease: Usually on a Monday/Wednesday/Friday dialysis schedule-hemodialysis today for volume unloading. To follow-up with vascular surgery and undergo evaluation of first stage brachiobasilic fistula for possible transposition in 6 weeks. 3. Anemia of chronic kidney disease: Last hemoglobin noted from dialysis center was 10.7, resume Mircera per protocol. 4. Metabolic bone disease: Resume calcitriol with dialysis, resume phosphorus binder/low phosphorus diet. 5. Peripheral vascular disease status post bilateral BKA  6. Protein calorie malnutrition with weight loss: Continue to address overall nutritional supplementation, encouraging proteins.  7. Status post orthotopic liver transplant: Resume immunosuppressive regimen   [name] K. 07/31/2016, 10:52 PM

## 2016-07-31 NOTE — Progress Notes (Signed)
New Admission Note:  Arrival Method: EMS Stretcher  Mental Orientation: Alert and oriented x 4  Telemetry:N/A Assessment: Completed Skin: sacral decub, B BKA IV: NSL  Pain: Denies  Tubes: N/A  Safety Measures: Safety Fall Prevention Plan was given, discussed and signed. Admission: Completed [address] [place] Orientation: Patient has been orientated to the room, unit and the staff. Family: None  Orders have been reviewed and implemented. Will continue to monitor the patient. Call light has been placed within reach and bed alarm has been activated.   Sima Matas BSN, RN  Phone Number: [phone]

## 2016-07-31 NOTE — H&P (Addendum)
History and Physical    [name] CBJ:[id number] DOB: [birth year] DOA: 07/31/2016  Referring MD/NP/PA: Dr. Myrene Buddy PCP: [name], MD  Patient coming from: [facility]  Chief Complaint: Shortness of breath  HPI: [name] is a 61 y.o. 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 She complained of chest discomfort because of the shortness of breath symptoms. Patient reports that she went to her normally scheduled hemodialysis appointment. She reports not being normally on oxygen at baseline. Patient went to [facility] initially and was found to have O2 saturations as low as 85% on room air. CXR showing pulmonary edema with pleural effusions. Patient was placed on 2 L of nasal cannula oxygen with improvement of O2 saturations. Labs at the outside facility revealed WBC 9.7, hemoglobin 12.1, platelet count 286, INR 3.2, BUN 18, creatinine 4.3.  ABG pH 7.49, PCO2 37, and PCO2 53. Dr. Posey Pronto of nephrology was consulted and patient is to receive dialysis tonight. Transferring patient to MedSurg bed as observation.  ED Course: As seen above   Review of Systems: As per HPI otherwise 10 point review of systems negative.   Past Medical History:  Diagnosis Date  . Anemia    takes Folic Acid daily  . Anxiety   . Aortic stenosis   . Arthritis    "left hand, back" (08/30/2012)  . Asthma   . CAD (coronary artery disease)   . CAD (coronary artery disease) Jan. 2015   Cath: 20% LAD, 50% D1; s/p LIMA-LAD  . Chest pain 03/06/2015  . CHF (congestive heart failure) ([place]) 05/2016  . Chronic back pain   . Chronic bronchitis ([place])    "q yr w/season changes" (08/30/2012)  . Chronic constipation    takes MIralax and Colace daily  . COPD (chronic obstructive pulmonary disease) ([place])   . Depression    takes Cymbalta for "severe" depression  . End stage renal disease on dialysis [facility]) 02/27/2011   Kidneys shut down at time of liver transplant in Sept 2011 at [facility] in [place], she has been on HD ever since.  Dialyzes at [facility] HD on TTS schedule.  Had L forearm graft used 10 months then removed Dec 2012 due to suspected infection.  A right upper arm AVG was placed Dec 2012 but she developed steal symptoms acutely and it was ligated the same day.  Never had an AV fistula due to small veins.  Now has L thigh AVG put in Jan 2013, has not clotted to date.    [name] GERD (gastroesophageal reflux disease)    takes Omeprazole daily  . Headache    "at least monthly" (08/30/2012)  . Hepatitis C   . History of blood transfusion    "several" (08/30/2012)  . Hypertension    takes Metoprolol and Lisinopril daily, sees Dr Bea Graff  . Hypothyroidism    takes Synthroid daily  . Migraine    "last migraine was in 2013" (08/30/2012)  . Neuromuscular disorder ([place])    carpal tunnel in right hand  . Obesity   . Peripheral vascular disease (HCC) hands and legs  . Pneumonia    "today and several times before" (08/30/2012)  . 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 S/P unilateral BKA (below knee amputation), left ([place]) 04/30/2016  . SVT (supraventricular tachycardia) ([place]) 06/09/14  . Tobacco abuse   . Type II diabetes mellitus (HCC)    Levemir 2units daily if > 150    Past Surgical History:  Procedure Laterality Date  . ABDOMINAL AORTOGRAM W/LOWER EXTREMITY N/A 07/04/2016   Procedure: Abdominal Aortogram w/Lower Extremity;  Surgeon: Elam Dutch, MD;  Location: [place] CV LAB;  Service: Cardiovascular;  Laterality: N/A;  . AMPUTATION Left 04/24/2016   Procedure:  AMPUTATION BELOW KNEE;  Surgeon: Angelia Mould, MD;  Location: [place];  Service: Vascular;  Laterality: Left;  . AMPUTATION Right 07/08/2016   Procedure: AMPUTATION BELOW KNEE;  Surgeon: Angelia Mould, MD;  Location: [place];  Service: Vascular;  Laterality: Right;  . AORTIC VALVE REPLACEMENT N/A 03/15/2013   AVR; Surgeon: Ivin Poot, MD;  Location: [facility] OR; Open Heart Surgery;  65mm[employer] mechanical prosthesis, top hat valve  . ARTERIOVENOUS GRAFT PLACEMENT Left 10/03/10    forearm  . AV FISTULA PLACEMENT  01/29/2011   Procedure: INSERTION OF ARTERIOVENOUS (AV) GORE-TEX GRAFT ARM;  Surgeon: Elam Dutch, MD;  Location: [facility] OR;  Service: Vascular;  Laterality: Right;  . AV FISTULA PLACEMENT  03/10/2011   Procedure: INSERTION OF ARTERIOVENOUS (AV) GORE-TEX GRAFT THIGH;  Surgeon: Elam Dutch, MD;  Location: [place];  Service: Vascular;  Laterality: Left;  . AV FISTULA PLACEMENT Left 07/21/2016   Procedure: CREATION OF LEFT UPPER ARM ARTERIOVENOUS (AV) FISTULA;  Surgeon: Conrad Canal [place], MD;  Location: [place];  Service: Vascular;  Laterality: Left;  . [place] REMOVAL  12/23/2010   Procedure: REMOVAL OF ARTERIOVENOUS GORETEX GRAFT ([place]);  Surgeon: Elam Dutch, MD;  Location: [place];  Service: Vascular;  Laterality: Left;  procedure started @1736 -1852  . CHOLECYSTECTOMY  1993  . CORONARY ARTERY BYPASS GRAFT N/A 03/15/2013   Procedure: CORONARY ARTERY BYPASS GRAFTING (CABG) times one using left internal mammary artery.;  Surgeon: Ivin Poot, MD;  Location: [place];  Service: Open Heart Surgery;  Laterality: N/A;  POSS CABG X 1  . CYSTOSCOPY  1990's  . INSERTION OF DIALYSIS CATHETER  12/23/2010   Procedure: INSERTION OF DIALYSIS CATHETER;  Surgeon: Elam Dutch, MD;  Location: [place];  Service: Vascular;  Laterality: Right;  Right Internal Jugular 28cm dialysis catheter insertion procedure time 1701-1720   . INSERTION OF DIALYSIS CATHETER Right 03/11/2016   Procedure:  INSERTION OF DIALYSIS CATHETER;  Surgeon: Angelia Mould, MD;  Location: [place];  Service: Vascular;  Laterality: Right;  . INTRAOPERATIVE TRANSESOPHAGEAL ECHOCARDIOGRAM N/A 03/15/2013   Procedure: INTRAOPERATIVE TRANSESOPHAGEAL ECHOCARDIOGRAM;  Surgeon: Ivin Poot, MD;  Location: [place];  Service: Open Heart Surgery;  Laterality: N/A;  . LEFT HEART CATHETERIZATION WITH CORONARY ANGIOGRAM N/A 07/29/2012   Procedure: LEFT HEART CATHETERIZATION WITH CORONARY ANGIOGRAM;  Surgeon: Troy Sine, MD;  Location: [facility] CATH LAB;  Service: Cardiovascular;  Laterality: N/A;  . LEFT HEART CATHETERIZATION WITH CORONARY ANGIOGRAM N/A 03/10/2013   Procedure: LEFT HEART CATHETERIZATION WITH CORONARY ANGIOGRAM;  Surgeon: Troy Sine, MD;  Location: [facility] CATH LAB;  Service: Cardiovascular;  Laterality: N/A;  . LEFT HEART CATHETERIZATION WITH CORONARY/GRAFT ANGIOGRAM N/A 12/24/2011   Procedure: LEFT HEART CATHETERIZATION WITH Beatrix Fetters;  Surgeon: [name], MD;  Location: [facility] CATH  LAB;  Service: Cardiovascular;  Laterality: N/A;  . LEFT HEART CATHETERIZATION WITH CORONARY/GRAFT ANGIOGRAM N/A 12/16/2013   Procedure: LEFT HEART CATHETERIZATION WITH Beatrix Fetters;  Surgeon: Troy Sine, MD;  Location: [facility] CATH LAB;  Service: Cardiovascular;  Laterality: N/A;  . LIGATION ARTERIOVENOUS GORTEX GRAFT Left 03/11/2016   Procedure: LIGATION THIGH ARTERIOVENOUS GORTEX GRAFT;  Surgeon: Angelia Mould, MD;  Location: [place];  Service: Vascular;  Laterality: Left;  . LIVER TRANSPLANT  10/25/2009   sees Dr Ferol Luz 1 every 6 months, saw last in Dec 2013. [name] Coord [phone]  . PERIPHERAL VASCULAR CATHETERIZATION N/A 11/06/2015   Procedure: Abdominal Aortogram;  Surgeon: Serafina Mitchell, MD;  Location: [place] CV LAB;  Service: Cardiovascular;  Laterality: N/A;  . PERIPHERAL VASCULAR CATHETERIZATION N/A 11/06/2015   Procedure: Lower Extremity Angiography;  Surgeon: Serafina Mitchell, MD;  Location: [place] CV LAB;  Service: Cardiovascular;  Laterality: N/A;  . PERIPHERAL VASCULAR CATHETERIZATION  11/06/2015   Procedure: Peripheral Vascular Atherectomy;  Surgeon: Serafina Mitchell, MD;  Location: [place] CV LAB;  Service: Cardiovascular;;  Left Superficial femoral  . SHUNTOGRAM Left 05/15/2014   Procedure: SHUNTOGRAM;  Surgeon: Conrad Grayhawk, MD;  Location: [facility] CATH LAB;  Service: Cardiovascular;  Laterality: Left;  . SMALL INTESTINE SURGERY  90's  . SPINAL GROWTH RODS  2010   "put 2 metal rods in my back; they had detetriorated" (08/30/2012)  . THROMBECTOMY    . THROMBECTOMY AND REVISION OF ARTERIOVENTOUS (AV) GORETEX  GRAFT Left 03/30/2014  . THROMBECTOMY AND REVISION OF ARTERIOVENTOUS (AV) GORETEX  GRAFT Left 03/30/2014   Procedure: THROMBECTOMY AND REVISION OF ARTERIOVENTOUS (AV) GORETEX  GRAFT;  Surgeon: Conrad Browns, MD;  Location: [place];  Service: Vascular;  Laterality: Left;  . TUBAL LIGATION  1990's     reports that she has been smoking Cigarettes.  She has a 20.00 pack-year smoking history. She has never used smokeless tobacco. She reports that she does not drink alcohol or use drugs.  Allergies  Allergen Reactions  . Acetaminophen Other (See Comments)    Liver transplant recipient   . Codeine Itching  . Mirtazapine Other (See Comments)    hallucination    Family History  Problem Relation Age of Onset  . Cancer Mother   . Diabetes Mother   . Hypertension Mother   . Stroke Mother   . Cancer Father   . Anesthesia problems Neg Hx   . Hypotension Neg Hx   . Malignant hyperthermia Neg Hx   . Pseudochol deficiency Neg Hx     Prior to Admission medications   Medication Sig Start Date End Date Taking? Authorizing Provider  aspirin EC 81 MG tablet Take 81 mg by mouth daily at 12 noon.     [provider]  calcitRIOL (ROCALTROL) 0.5 MCG capsule Take 1 capsule (0.5 mcg total) by mouth every Monday, Wednesday, and Friday with hemodialysis.  07/25/16   Angiulli, Lavon Paganini, PA-C  calcium acetate (PHOSLO) 667 MG capsule Take 1 capsule (667 mg total) by mouth 3 (three) times daily with meals. 07/24/16   Angiulli, Lavon Paganini, PA-C  Calcium Carbonate Antacid (TUMS PO) Take 1 tablet by mouth daily with lunch.    [provider]  enoxaparin (LOVENOX) 60 MG/0.6ML injection Inject 0.5 mLs (50 mg total) into the skin daily. 07/25/16   Angiulli, Lavon Paganini, PA-C  feeding supplement (BOOST / RESOURCE BREEZE) LIQD Take 1 Container by mouth 3 (three) times daily between  meals. Patient not taking: Reported on 06/30/2016 04/25/16   Reyne Dumas, MD  gabapentin (NEURONTIN) 300 MG capsule Take 1 capsule (300 mg total) by mouth at bedtime. 07/24/16   Angiulli, Lavon Paganini, PA-C  levothyroxine (SYNTHROID, LEVOTHROID) 175 MCG tablet Take 1 tablet (175 mcg total) by mouth daily at 12 noon. 07/24/16   Angiulli, Lavon Paganini, PA-C  methocarbamol (ROBAXIN) 500 MG tablet Take 0.5 tablets (250 mg total) by mouth every 6 (six) hours as needed for muscle spasms. 07/24/16   Angiulli, Lavon Paganini, PA-C  metoCLOPramide (REGLAN) 5 MG tablet Take 1 tablet (5 mg total) by mouth 2 (two) times daily before a meal. Decrease to once a day before breakfast after a couple of weeks. 07/24/16   Angiulli, Lavon Paganini, PA-C  metoprolol tartrate (LOPRESSOR) 25 MG tablet Take 0.5 tablets (12.5 mg total) by mouth 2 (two) times daily. 07/24/16   Angiulli, Lavon Paganini, PA-C  multivitamin (RENA-VIT) TABS tablet Take 1 tablet by mouth at bedtime. 05/20/16   Love, Ivan Anchors, PA-C  nitroGLYCERIN (NITROSTAT) 0.4 MG SL tablet Place 1 tablet (0.4 mg total) under the tongue every 5 (five) minutes as needed for chest pain. 03/20/14   Elgergawy, Silver Huguenin, MD  oxyCODONE 10 MG TABS Take 1 tablet (10 mg total) by mouth every 4 (four) hours as needed for moderate pain. 07/24/16   Angiulli, Lavon Paganini, PA-C  pantoprazole (PROTONIX) 40 MG tablet Take 1 tablet (40 mg total) by mouth daily. Patient not taking: Reported on 06/30/2016  05/20/16   Love, Ivan Anchors, PA-C  polyethylene glycol [facility] / GLYCOLAX) packet Take 17 g by mouth 2 (two) times daily. Patient taking differently: Take 17 g by mouth 2 (two) times daily as needed (constipation). Mix in 8 oz liquid and drink 05/20/16   Love, Ivan Anchors, PA-C  tacrolimus (PROGRAF) 1 MG capsule Take 2 capsules (2 mg total) by mouth 2 (two) times daily. Take 2 capsules twice daily. Patient taking differently: Take 2 mg by mouth 2 (two) times daily.  11/29/15   Cristal Ford, DO  warfarin (COUMADIN) 2.5 MG tablet Take 1 tablet (2.5 mg total) by mouth daily at 6 PM. 07/24/16   Angiulli, Lavon Paganini, PA-C    Physical Exam: Constitutional: Ill-appearing female who is in NAD, calm, comfortable Vitals:   07/31/16 2243  BP: (!) 113/47  Pulse: 91  Resp: 17  Temp: 99 F (37.2 C)  TempSrc: Oral  SpO2: 100%  Weight: 49.7 kg (109 lb 9.6 oz)   Eyes: PERRL, lids and conjunctivae normal ENMT: Mucous membranes are normal. Posterior pharynx clear of any exudate or lesions.  Neck: normal, supple, no masses, no thyromegaly Respiratory:  On 2 L of [place] with normal respiratory effort. Positive crackles appreciated in both lung fields with decreased aeration at the bases. Cardiovascular: Regular rate and rhythm Positive murmur with click appreciated. No extremity edema. No carotid bruits.  Abdomen: no tenderness, no masses palpated. No hepatosplenomegaly. Bowel sounds positive.  Musculoskeletal: no clubbing / cyanosis.  B/L BKA Skin: no rashes, lesions, ulcers. No induration Neurologic: CN 2-12 grossly intact. Sensation intact, DTR normal. Strength 5/5 in all 4.  Psychiatric: Normal judgment and insight. Alert and oriented x 3. Normal mood.     Labs on Admission: I have personally reviewed following labs and imaging studies  CBC: No results for input(s): WBC, NEUTROABS, HGB, HCT, MCV, PLT in the last 168 hours. Basic Metabolic Panel: No results for input(s): NA, K, CL, CO2, GLUCOSE, BUN,  CREATININE,  CALCIUM, MG, PHOS in the last 168 hours. GFR: Estimated Creatinine Clearance: 15 mL/min (A) (by C-G formula based on SCr of 3.08 mg/dL (H)). Liver Function Tests: No results for input(s): AST, ALT, ALKPHOS, BILITOT, PROT, ALBUMIN in the last 168 hours. No results for input(s): LIPASE, AMYLASE in the last 168 hours. No results for input(s): AMMONIA in the last 168 hours. Coagulation Profile:  Recent Labs Lab 07/29/16  INR 3.5*   Cardiac Enzymes: No results for input(s): CKTOTAL, CKMB, CKMBINDEX, TROPONINI in the last 168 hours. BNP (last 3 results) No results for input(s): PROBNP in the last 8760 hours. HbA1C: No results for input(s): HGBA1C in the last 72 hours. CBG:  Recent Labs Lab 07/31/16 2239  GLUCAP 113*   Lipid Profile: No results for input(s): CHOL, HDL, LDLCALC, TRIG, CHOLHDL, LDLDIRECT in the last 72 hours. Thyroid Function Tests: No results for input(s): TSH, T4TOTAL, FREET4, T3FREE, THYROIDAB in the last 72 hours. Anemia Panel: No results for input(s): VITAMINB12, FOLATE, FERRITIN, TIBC, IRON, RETICCTPCT in the last 72 hours. Urine analysis:    Component Value Date/Time   COLORURINE YELLOW 11/02/2012 0428   APPEARANCEUR CLOUDY (A) 11/02/2012 0428   LABSPEC 1.011 11/02/2012 0428   PHURINE 8.0 11/02/2012 0428   GLUCOSEU 250 (A) 11/02/2012 0428   HGBUR LARGE (A) 11/02/2012 0428   BILIRUBINUR NEGATIVE 11/02/2012 0428   KETONESUR NEGATIVE 11/02/2012 0428   PROTEINUR >300 (A) 11/02/2012 0428   UROBILINOGEN 0.2 11/02/2012 0428   NITRITE NEGATIVE 11/02/2012 0428   LEUKOCYTESUR NEGATIVE 11/02/2012 0428   Sepsis Labs: No results found for this or any previous visit (from the past 240 hour(s)).   Radiological Exams on Admission: No results found.    Assessment/Plan Acute respiratory failure with hypoxia  2/2Fluid overload with ESRD on HD, systolic and diastolic CHF: Acute. Patient found to be acutely hypoxic on room air down to 85% on admission to  [facility]. Patient given nasal cannula oxygen with improvement of O2 saturations. [facility] chest x-ray shows pulmonary edema with bilateral pleural effusions. Patient's last EF noted to be 40-45% with grade 3 dFx. - Admit to MedSurg bed - Renal/cardiac 5 diet with fluid restriction - Continuous pulse oximetry with nasal cannula oxygen overnight to keep O2 saturations greater than 92% - DuoNeb's when necessary shortness of breath/wheezing - Continue PhosLo, Rocaltrol,   - HD per nephrology  Essential hypertension - Continue metoprolol   Diabetes mellitus type 2 - Hypoglycemic protocol - CBGs every before meals with sensitive sliding scale insulin  Mechanical aortic valve on chronic anticoagulation: INR 3.5 - Pharmacy to dose Coumadin  Status post liver transplant - Continue Prograf   Hypothyroidism - Continue levothyroxine   PAD - continue ASA   B/L BKA   DVT prophylaxis: On Coumadin   Code Status: Partial code Family Communication: No family present at bedside  Disposition Plan: Likely discharge in 1-2 days  Consults called: Nephrology  Admission status: Observation  Norval Morton MD [facility] Pager [phone]  If 7PM-7AM, please contact night-coverage [email or web address] Password [facility]  07/31/2016, 11:02 PM

## 2016-08-01 DIAGNOSIS — Z992 Dependence on renal dialysis: Secondary | ICD-10-CM

## 2016-08-01 DIAGNOSIS — I248 Other forms of acute ischemic heart disease: Secondary | ICD-10-CM | POA: Diagnosis present

## 2016-08-01 DIAGNOSIS — Z89511 Acquired absence of right leg below knee: Secondary | ICD-10-CM | POA: Diagnosis not present

## 2016-08-01 DIAGNOSIS — E1122 Type 2 diabetes mellitus with diabetic chronic kidney disease: Secondary | ICD-10-CM | POA: Diagnosis present

## 2016-08-01 DIAGNOSIS — I2510 Atherosclerotic heart disease of native coronary artery without angina pectoris: Secondary | ICD-10-CM | POA: Diagnosis present

## 2016-08-01 DIAGNOSIS — E1151 Type 2 diabetes mellitus with diabetic peripheral angiopathy without gangrene: Secondary | ICD-10-CM | POA: Diagnosis present

## 2016-08-01 DIAGNOSIS — I5023 Acute on chronic systolic (congestive) heart failure: Secondary | ICD-10-CM

## 2016-08-01 DIAGNOSIS — N186 End stage renal disease: Secondary | ICD-10-CM

## 2016-08-01 DIAGNOSIS — I132 Hypertensive heart and chronic kidney disease with heart failure and with stage 5 chronic kidney disease, or end stage renal disease: Secondary | ICD-10-CM | POA: Diagnosis present

## 2016-08-01 DIAGNOSIS — Z7901 Long term (current) use of anticoagulants: Secondary | ICD-10-CM

## 2016-08-01 DIAGNOSIS — N2581 Secondary hyperparathyroidism of renal origin: Secondary | ICD-10-CM | POA: Diagnosis not present

## 2016-08-01 DIAGNOSIS — M549 Dorsalgia, unspecified: Secondary | ICD-10-CM | POA: Diagnosis present

## 2016-08-01 DIAGNOSIS — I5043 Acute on chronic combined systolic (congestive) and diastolic (congestive) heart failure: Secondary | ICD-10-CM | POA: Diagnosis present

## 2016-08-01 DIAGNOSIS — J9621 Acute and chronic respiratory failure with hypoxia: Secondary | ICD-10-CM | POA: Diagnosis not present

## 2016-08-01 DIAGNOSIS — E8889 Other specified metabolic disorders: Secondary | ICD-10-CM | POA: Diagnosis present

## 2016-08-01 DIAGNOSIS — Z89512 Acquired absence of left leg below knee: Secondary | ICD-10-CM | POA: Diagnosis not present

## 2016-08-01 DIAGNOSIS — E1121 Type 2 diabetes mellitus with diabetic nephropathy: Secondary | ICD-10-CM | POA: Diagnosis present

## 2016-08-01 DIAGNOSIS — E8770 Fluid overload, unspecified: Principal | ICD-10-CM

## 2016-08-01 DIAGNOSIS — Z952 Presence of prosthetic heart valve: Secondary | ICD-10-CM | POA: Diagnosis not present

## 2016-08-01 DIAGNOSIS — D631 Anemia in chronic kidney disease: Secondary | ICD-10-CM | POA: Diagnosis present

## 2016-08-01 DIAGNOSIS — E039 Hypothyroidism, unspecified: Secondary | ICD-10-CM | POA: Diagnosis present

## 2016-08-01 DIAGNOSIS — R0602 Shortness of breath: Secondary | ICD-10-CM | POA: Diagnosis not present

## 2016-08-01 DIAGNOSIS — G8929 Other chronic pain: Secondary | ICD-10-CM | POA: Diagnosis present

## 2016-08-01 DIAGNOSIS — Z944 Liver transplant status: Secondary | ICD-10-CM | POA: Diagnosis not present

## 2016-08-01 LAB — RENAL FUNCTION PANEL
ANION GAP: 5 (ref 5–15)
Albumin: 1.7 g/dL — ABNORMAL LOW (ref 3.5–5.0)
Albumin: 1.7 g/dL — ABNORMAL LOW (ref 3.5–5.0)
Anion gap: 6 (ref 5–15)
BUN: 10 mg/dL (ref 6–20)
BUN: <5 mg/dL — ABNORMAL LOW (ref 6–20)
CHLORIDE: 98 mmol/L — AB (ref 101–111)
CHLORIDE: 99 mmol/L — AB (ref 101–111)
CO2: 29 mmol/L (ref 22–32)
CO2: 30 mmol/L (ref 22–32)
Calcium: 8.2 mg/dL — ABNORMAL LOW (ref 8.9–10.3)
Calcium: 8.1 mg/dL — ABNORMAL LOW (ref 8.9–10.3)
Creatinine, Ser: 3.24 mg/dL — ABNORMAL HIGH (ref 0.44–1.00)
Creatinine, Ser: 2.22 mg/dL — ABNORMAL HIGH (ref 0.44–1.00)
GFR calc Af Amer: 17 mL/min — ABNORMAL LOW (ref >60)
GFR, EST AFRICAN AMERICAN: 26 mL/min — AB (ref >60)
GFR, EST NON AFRICAN AMERICAN: 14 mL/min — AB (ref >60)
GFR, EST NON AFRICAN AMERICAN: 23 mL/min — AB (ref >60)
GLUCOSE: 94 mg/dL (ref 65–99)
Glucose, Bld: 78 mg/dL (ref 65–99)
POTASSIUM: 3.5 mmol/L (ref 3.5–5.1)
POTASSIUM: 3.8 mmol/L (ref 3.5–5.1)
Phosphorus: 4.1 mg/dL (ref 2.5–4.6)
Phosphorus: 3 mg/dL (ref 2.5–4.6)
Sodium: 133 mmol/L — ABNORMAL LOW (ref 135–145)
Sodium: 134 mmol/L — ABNORMAL LOW (ref 135–145)

## 2016-08-01 LAB — CBC
HEMATOCRIT: 32.3 % — AB (ref 36.0–46.0)
HEMOGLOBIN: 10.6 g/dL — AB (ref 12.0–15.0)
MCH: 29.8 pg (ref 26.0–34.0)
MCHC: 32.8 g/dL (ref 30.0–36.0)
MCV: 90.7 fL (ref 78.0–100.0)
Platelets: 287 10*3/uL (ref 150–400)
RBC: 3.56 MIL/uL — AB (ref 3.87–5.11)
RDW: 17.7 % — ABNORMAL HIGH (ref 11.5–15.5)
WBC: 9.2 10*3/uL (ref 4.0–10.5)

## 2016-08-01 LAB — PROTIME-INR
INR: 2.36
Prothrombin Time: 26.3 seconds — ABNORMAL HIGH (ref 11.4–15.2)

## 2016-08-01 LAB — GLUCOSE, CAPILLARY
GLUCOSE-CAPILLARY: 105 mg/dL — AB (ref 65–99)
GLUCOSE-CAPILLARY: 172 mg/dL — AB (ref 65–99)
Glucose-Capillary: 80 mg/dL (ref 65–99)
Glucose-Capillary: 130 mg/dL — ABNORMAL HIGH (ref 65–99)

## 2016-08-01 LAB — TROPONIN I
Troponin I: 0.38 ng/mL (ref <0.03)
Troponin I: 0.39 ng/mL (ref <0.03)

## 2016-08-01 MED ORDER — SODIUM CHLORIDE 0.9 % IV SOLN: 100.0000 mL | INTRAVENOUS | Status: DC | PRN

## 2016-08-01 MED ORDER — PRO-STAT SUGAR FREE PO LIQD
30.0000 mL | Freq: Two times a day (BID) | ORAL | Status: DC
  Administered 2016-08-01: 30 mL via ORAL
  Filled 2016-08-01 (×2): qty 30

## 2016-08-01 MED ORDER — LIDOCAINE HCL (PF) 1 % IJ SOLN: 5.0000 mL | INTRAMUSCULAR | Status: DC | PRN

## 2016-08-01 MED ORDER — WARFARIN - PHARMACIST DOSING INPATIENT: Freq: Every day | Status: DC

## 2016-08-01 MED ORDER — HEPARIN SODIUM (PORCINE) 1000 UNIT/ML DIALYSIS: 1000.0000 [IU] | INTRAMUSCULAR | Status: DC | PRN

## 2016-08-01 MED ORDER — PENTAFLUOROPROP-TETRAFLUOROETH EX AERO: 1.0000 "application " | INHALATION_SPRAY | CUTANEOUS | Status: DC | PRN

## 2016-08-01 MED ORDER — INSULIN ASPART 100 UNIT/ML ~~LOC~~ SOLN
0.0000 [IU] | Freq: Three times a day (TID) | SUBCUTANEOUS | Status: DC
  Administered 2016-08-01: 2 [IU] via SUBCUTANEOUS

## 2016-08-01 MED ORDER — LIDOCAINE-PRILOCAINE 2.5-2.5 % EX CREA: 1.0000 "application " | TOPICAL_CREAM | CUTANEOUS | Status: DC | PRN

## 2016-08-01 MED ORDER — WARFARIN SODIUM 3 MG PO TABS
3.0000 mg | ORAL_TABLET | ORAL | Status: AC
  Administered 2016-08-01: 3 mg via ORAL
  Filled 2016-08-01: qty 1

## 2016-08-01 MED ORDER — NEPRO/CARBSTEADY PO LIQD: 237.0000 mL | Freq: Two times a day (BID) | ORAL | Status: DC

## 2016-08-01 MED ORDER — ALTEPLASE 2 MG IJ SOLR: 2.0000 mg | Freq: Once | INTRAMUSCULAR | Status: DC | PRN

## 2016-08-01 NOTE — Evaluation (Signed)
Physical Therapy Evaluation Patient Details Name: [name] MRN: [id number] DOB: [birth year] Today's Date: 08/01/2016   History of Present Illness  [name] Sonier [name] a 61 y.o.femalewith medical history significant for hypertension, diabetes, coronary artery disease, chronic back pain, depression, end-stage renal disease on HD, mechanical aortic valve on warfarin, and history of liver transplantation, H/o PAD s/p Bil BKA's in 2018.   Patient admitted 07/31/16 with SOB and chest discomfort.  Patient with acute on chronic resp failure and volume overload.  Clinical Impression  Patient presents with problems listed below.  Will benefit from acute PT to maximize functional mobility prior to return to home with family.  Patient receiving HHPT pta - recommend that these services continue at d/c.    Follow Up Recommendations Home health PT;Supervision for mobility/OOB (Had HHPT pta)    Equipment Recommendations  None recommended by PT    Recommendations for Other Services       Precautions / Restrictions Precautions Precautions: Fall Restrictions Weight Bearing Restrictions: No (No orders)      Mobility  Bed Mobility               General bed mobility comments: Patient in recliner - nsg used lift to move patient to recliner with fixed armrests.  Transfers                 General transfer comment: NT - patient uses lateral transfer  Ambulation/Gait                Stairs            Wheelchair Mobility    Modified Rankin (Stroke Patients Only)       Balance Overall balance assessment: Needs assistance Sitting-balance support: Single extremity supported Sitting balance-Leahy Scale: Fair Sitting balance - Comments: Patient able to lean forward in recliner and maintain sitting balance with single UE support.                                     Pertinent Vitals/Pain Pain Assessment: 0-10 Pain Score: 8  Pain Location: RLE Pain  Descriptors / Indicators: Aching;Sore Pain Intervention(s): Monitored during session;Repositioned    Home Living Family/patient expects to be discharged to:: Private residence Living Arrangements: Children (Daughter) Available Help at Discharge: Family;Available 24 hours/day (2 daughters alternate for 24/7 assist) Type of Home: Mobile home Home Access: Ramped entrance     Home Layout: One level Home Equipment: Bedside commode;Walker - 2 wheels;Wheelchair - manual      Prior Function Level of Independence: Needs assistance   Gait / Transfers Assistance Needed: Patient reports she is able to transfer into/out of w/c on her own, and propel w/c.  Daughter assists for car transfers, and if patient is fatigued.  ADL's / Homemaking Assistance Needed: Patient reports she is independent with bathing/dressing.  Daughters assist with meals, housekeeping, and transportation.        Hand Dominance   Dominant Hand: Right    Extremity/Trunk Assessment   Upper Extremity Assessment Upper Extremity Assessment: Overall WFL for tasks assessed    Lower Extremity Assessment Lower Extremity Assessment: Generalized weakness (Able to move BLE's against gravity)       Communication   Communication: No difficulties  Cognition Arousal/Alertness: Awake/alert Behavior During Therapy: WFL for tasks assessed/performed Overall Cognitive Status: No family/caregiver present to determine baseline cognitive functioning  General Comments: Follows commands.  Able to provide answers to questions.      General Comments General comments (skin integrity, edema, etc.): Noted small area on Lt LE incision.  Rt incision staples in place.    Exercises Amputee Exercises Quad Sets: AROM;Both;10 reps;Seated Straight Leg Raises: AROM;Both;5 reps;Seated   Assessment/Plan    PT Assessment Patient needs continued PT services  PT Problem List Decreased  strength;Decreased activity tolerance;Decreased balance;Decreased mobility;Decreased knowledge of use of DME;Cardiopulmonary status limiting activity;Decreased skin integrity;Pain       PT Treatment Interventions DME instruction;Functional mobility training;Therapeutic activities;Therapeutic exercise;Balance training;Patient/family education    PT Goals (Current goals can be found in the Care Plan section)  Acute Rehab PT Goals Patient Stated Goal: To increase mobility PT Goal Formulation: With patient Time For Goal Achievement: 08/08/16 Potential to Achieve Goals: Good    Frequency Min 3X/week   Barriers to discharge        Co-evaluation               AM-PAC PT "6 Clicks" Daily Activity  Outcome Measure Difficulty turning over in bed (including adjusting bedclothes, sheets and blankets)?: A Little Difficulty moving from lying on back to sitting on the side of the bed? : A Little Difficulty sitting down on and standing up from a chair with arms (e.g., wheelchair, bedside commode, etc,.)?: Total Help needed moving to and from a bed to chair (including a wheelchair)?: A Little Help needed walking in hospital room?: Total Help needed climbing 3-5 steps with a railing? : Total 6 Click Score: 12    End of Session Equipment Utilized During Treatment: Oxygen Activity Tolerance: Patient limited by pain Patient left: in chair;with call bell/phone within reach   PT Visit Diagnosis: Muscle weakness (generalized) (M62.81);Pain Pain - Right/Left: Right Pain - part of body: Leg    Time: 2027-2040 PT Time Calculation (min) (ACUTE ONLY): 13 min   Charges:   PT Evaluation $PT Eval Moderate Complexity: 1 Procedure     PT G Codes:        Carita Pian. Sanjuana Kava, [facility] Acute Rehab Services Pager [phone]   Despina Pole 08/01/2016, 9:28 PM

## 2016-08-01 NOTE — Progress Notes (Signed)
[address] for coumadin  Indication: history of mechanical AVR  Allergies  Allergen Reactions  . Acetaminophen Other (See Comments)    Liver transplant recipient   . Codeine Itching  . Mirtazapine Other (See Comments)    hallucination    Patient Measurements: Height: 5\' 4"  (162.6 cm) Weight: 108 lb 0.4 oz (49 kg) IBW/kg (Calculated) : 54.7  Vital Signs: Temp: 97.5 F (36.4 C) (06/22 1829) Temp Source: Oral (06/22 1829) BP: 117/51 (06/22 1829) Pulse Rate: 69 (06/22 1829)  Labs:  Recent Labs  08/01/16 0214 08/01/16 0540 08/01/16 0803 08/01/16 1825  HGB 10.6*  --   --   --   HCT 32.3*  --   --   --   PLT 287  --   --   --   LABPROT  --   --   --  26.3*  INR  --   --   --  2.36  CREATININE 3.24* 2.22*  --   --   TROPONINI 0.38*  --  0.39*  --     Assessment 61 yo female on coumadin prior to arrival for history of mechanical AVR. INR this evening is subtherapeutic at 2.36 (goal 2.5 -3.5). CBC stable. Prior to arrival coumadin dose is 2.5 mg on Sunday, Tuesday and Thursday and 1.25 mg all other days. Recent dose decrease by coumadin clinic on 6/21.  Goal of Therapy:  INR 2.5 - 3.5 Monitor platelets by anticoagulation protocol: Yes   Plan:  1. Coumadin 3 mg x 1 this evening  2. Daily INR 3. If INR remains subtherapeutic or lower in am may need to consider using heparin infusion until INR therapeutic  Vincenza Hews, PharmD, BCPS 08/01/2016, 7:55 PM

## 2016-08-01 NOTE — Care Management Note (Signed)
Case Management Note  Patient Details  Name: [name] MRN: [id number] Date of Birth: October 05, 1955  Subjective/Objective:          CM following for progression and d/c planning.           Action/Plan: 08/01/2016 Notified by [facility] rep that this pt is active with that agency for [facility] services. Will follow for d/c planning and assist with any HH or DME needs. Pt has freq admission 7 in the past 6 months will discuss possible option with this pt when she is more stable.   Expected Discharge Date:  08/04/16               Expected Discharge Plan:  [place]  In-House Referral:  Clinical Social Work  Discharge planning Services  CM Consult  Post Acute Care Choice:  Home Health Choice offered to:  Patient  DME Arranged:    DME Agency:     HH Arranged:  RN, PT [place] Agency:  [facility]  Status of Service:  In process, will continue to follow  If discussed at Long Length of Stay Meetings, dates discussed:    Additional Comments:  Adron Bene, RN 08/01/2016, 2:15 PM

## 2016-08-01 NOTE — Progress Notes (Addendum)
Patient ID: [name], female   DOB: 21-Nov-1955, 61 y.o.   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 ED with shortness of breath and chest discomfort. Patient presented to [facility] initially and was found to have oxygen saturation 85% on RA. This was thought to be due to volume overload and pt was transferred to [facility] for HD.   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, neuropathy  - keep on SSI for now, adjust regimen based on CBG control   Active Problems:   Hypothyroidism - keep on synthroid     Acute on chronic systolic CHF (congestive heart failure) ([place]), elevated troponin, demand ischemia  - volume control with HD  - no need for trop cycling as pt with no chest pain  - EDW 49 kg     Chronic anticoagulation w/ coumadin, goal INR 2.5-3.0 w/ AVR - on coumadin     S/P liver transplant ([place]) - continue Prograf     HTN, essential - continue Metoprolol     S/P bilateral BKA (below knee amputation) ([place]) - continue on ASA - PT eval   DVT prophylaxis: Coumadin  Code Status: No Intubation but OK to initiate code and use medications per ACLS Family Communication: Patient at bedside    Disposition Plan: to be determined   Consultants:   Nephrology   Procedures:   None  Antimicrobials:   None  Subjective: Pt reports feeling tired but says she is better this AM then yesterday.   Objective: Vitals:   08/01/16 0430 08/01/16 0436 08/01/16 0516 08/01/16 0919  BP: (!) 96/50 (!) 113/56 (!) 109/50 (!) 104/54  Pulse: 84 86 82 76  Resp:  18 18 16   Temp:  98.5 F (36.9 C) 98.3 F (36.8 C) 98.6 F (37 C)  TempSrc:  Oral Oral Oral  SpO2:  100% 90% 100%  Weight:  49 kg (108 lb 0.4 oz)    Height:        Intake/Output Summary (Last 24 hours) at 08/01/16 1658 Last data filed at 08/01/16 1300  Gross per 24 hour  Intake              240 ml  Output             2000 ml  Net            -1760 ml   Filed Weights   07/31/16 2300 08/01/16 0130 08/01/16 0436  Weight: 49.5 kg (109 lb 2 oz) 51 kg (112 lb 7 oz) 49 kg (108 lb 0.4 oz)    Examination:  General exam: Appears calm and comfortable  Respiratory system: Diminished air movement at bases  Cardiovascular system: S1 & S2 heard, RRR. 2/6 SEM, no rubs, gallops or clicks. No stump edema Gastrointestinal system: Abdomen is nondistended, soft and nontender. No organomegaly or masses felt.  Central nervous system: Alert and oriented. No focal neurological deficits. Extremities: b/l BKA, Left 1st BVT present   Data Reviewed: I have personally reviewed following labs and imaging studies  CBC:  Recent Labs Lab 08/01/16 0214  WBC 9.2  HGB 10.6*  HCT 32.3*  MCV 90.7  PLT 431   Basic Metabolic Panel:  Recent Labs Lab 08/01/16 0214 08/01/16 0540  NA 133* 134*  K 3.5 3.8  CL 98* 99*  CO2 29 30  GLUCOSE 94 78  BUN 10 <5*  CREATININE 3.24* 2.22*  CALCIUM 8.2* 8.1*  PHOS 4.1 3.0   Liver Function Tests:  Recent Labs Lab 08/01/16 0214 08/01/16 0540  ALBUMIN 1.7* 1.7*   Coagulation Profile:  Recent Labs Lab 07/29/16  INR 3.5*   Cardiac Enzymes:  Recent Labs Lab 08/01/16 0214 08/01/16 0803   TROPONINI 0.38* 0.39*   CBG:  Recent Labs Lab 07/31/16 2239 08/01/16 0813 08/01/16 1139 08/01/16 1651  GLUCAP 113* 80 105* 172*   Urine analysis:    Component Value Date/Time   COLORURINE YELLOW 11/02/2012 0428   APPEARANCEUR CLOUDY (A) 11/02/2012 0428   LABSPEC 1.011 11/02/2012 0428   PHURINE 8.0 11/02/2012 0428   GLUCOSEU 250 (A) 11/02/2012 0428   HGBUR LARGE (A) 11/02/2012 0428   BILIRUBINUR NEGATIVE 11/02/2012 0428   KETONESUR NEGATIVE 11/02/2012 0428   PROTEINUR >300 (A) 11/02/2012 0428   UROBILINOGEN 0.2 11/02/2012 0428   NITRITE NEGATIVE 11/02/2012 0428   LEUKOCYTESUR NEGATIVE 11/02/2012 0428   Radiology Studies: No results found.  Scheduled Meds: . aspirin EC  81 mg Oral Q1200  . calcitRIOL  0.5 mcg Oral Q M,W,F-HD  . calcium acetate  667 mg Oral TID WC  . calcium carbonate  1 tablet Oral Q lunch  . feeding supplement (NEPRO CARB STEADY)  237 mL Oral BID BM  . feeding supplement (PRO-STAT SUGAR FREE 64)  30 mL Oral BID  . gabapentin  300 mg Oral QHS  . insulin aspart  0-9 Units Subcutaneous TID WC  . levothyroxine  175 mcg Oral Q1200  . mouth rinse  15 mL Mouth Rinse BID  . metoprolol tartrate  12.5 mg Oral BID  . multivitamin  1 tablet Oral QHS  . pantoprazole  40 mg Oral Daily  . tacrolimus  2 mg Oral BID   Continuous Infusions:   LOS: 0 days   Time spent: 35 minutes   Faye Ramsay, MD [facility] Pager [phone]  If 7PM-7AM, please contact night-coverage [email or web address] Password [facility] 08/01/2016, 4:58 PM

## 2016-08-01 NOTE — Consult Note (Signed)
[place] Nurse wound consult note Reason for Consult: coccyx wound, stage III Wound type:pressure Pressure Injury POA: Yes Measurement: 4cm x 3cm x 0.2cm  Wound bed:80% white 20% pale pink Drainage (amount, consistency, odor) no drainage or odor noted Periwound: intact Dressing procedure/placement/frequency: I have provided nurses with orders for NS moist to dry dressings to coccyx wound. I have ordered a pressure redistribution chair pad for pt to use here and for home use.  [name] sacral area is intact, she does not lie on her back but she does sit in the chair a lot given that she is a bilateral LE amputee.  Pt encouraged to use chair pad everytime she is up in hospital as well as home. We will not follow, but will remain available to this patient, to nursing, and the medical and/or surgical teams.  Please re-consult if we need to assist further.   Fara Olden, RN-C, WTA-C Wound Treatment Associate

## 2016-08-01 NOTE — Progress Notes (Signed)
CRITICAL VALUE ALERT             Critical Value:  0.38 Troponin  Date & Time Notied:  08/01/16 0344  Provider Notified: yes   Orders Received/Actions taken: none

## 2016-08-01 NOTE — Progress Notes (Signed)
[facility] Progress Note   Subjective:  Seen in room. Very tired today, admitted and dialyzed overnight, finished HD around 4am. Denies CP or dyspnea now, still on oxygen 2L/min. Very distressed "I have not been able to eat since I went home last time"  "they had my dry weight wrong"  "I cant eat this food"  Nothing touched on her lunch tray   Objective Vitals:   08/01/16 0430 08/01/16 0436 08/01/16 0516 08/01/16 0919  BP: (!) 96/50 (!) 113/56 (!) 109/50 (!) 104/54  Pulse: 84 86 82 76  Resp:  18 18 16   Temp:  98.5 F (36.9 C) 98.3 F (36.8 C) 98.6 F (37 C)  TempSrc:  Oral Oral Oral  SpO2:  100% 90% 100%  Weight:  49 kg (108 lb 0.4 oz)    Height:       Physical Exam General: Frail, sleepy female. NAD. On nasal oxygen 2L/min. Heart: RRR; 2/6 murmur Lungs: CTAB but poor inspiratory effort Extremities: No stump edema Dialysis Access: TDC, L 1st BVT present.  Additional Objective Labs: Basic Metabolic Panel:  Recent Labs Lab 08/01/16 0214 08/01/16 0540  NA 133* 134*  K 3.5 3.8  CL 98* 99*  CO2 29 30  GLUCOSE 94 78  BUN 10 <5*  CREATININE 3.24* 2.22*  CALCIUM 8.2* 8.1*  PHOS 4.1 3.0   Liver Function Tests:  Recent Labs Lab 08/01/16 0214 08/01/16 0540  ALBUMIN 1.7* 1.7*   CBC:  Recent Labs Lab 08/01/16 0214  WBC 9.2  HGB 10.6*  HCT 32.3*  MCV 90.7  PLT 287   Cardiac Enzymes:  Recent Labs Lab 08/01/16 0214 08/01/16 0803  TROPONINI 0.38* 0.39*   CBG:  Recent Labs Lab 07/31/16 2239 08/01/16 0813  GLUCAP 113* 80   Medications:  . aspirin EC  81 mg Oral Q1200  . calcitRIOL  0.5 mcg Oral Q M,W,F-HD  . calcium acetate  667 mg Oral TID WC  . calcium carbonate  1 tablet Oral Q lunch  . gabapentin  300 mg Oral QHS  . insulin aspart  0-9 Units Subcutaneous TID WC  . levothyroxine  175 mcg Oral Q1200  . mouth rinse  15 mL Mouth Rinse BID  . metoprolol tartrate  12.5 mg Oral BID  . multivitamin  1 tablet Oral QHS  .  pantoprazole  40 mg Oral Daily  . tacrolimus  2 mg Oral BID   Dialysis Orders: Monday/Wednesday/Friday, [facility], 3-1/2 hours, 180 dialyzer, BFR 450/dialysate flow AF 1.5, 3K/2.25 calcium, no UF profile, linear sodium modeling, Mircera 75 g every 2 weeks (last dose 6/15), calcitriol 0.5 g 3 days a week @ HD, EDW 49kg  Assessment/Plan: 1. Volume overload/pulmonary edema: X-ray report reviewed from [facility] and patient examined to corroborate findings.+ recent weight loss, estimated dry weight will require to be lowered a little more aggressively. S/p emergent hemodialysis overnight, will plan for another HD tomorrow (Sat) , then back to usual schedule. 2. End-stage renal disease: MWF schedule. S/p HD overnight last night to EDW (49kg), will plan another tomorrow, then back to MWF schedule. To follow-up with vascular surgery and undergo evaluation of first stage brachiobasilic fistula for possible transposition in 6 weeks. 3. Anemia of chronic kidney disease: Hgb 10.6; not due for ESA yet. 4. Metabolic bone disease: Ca/Phos ok. Continue Phoslo with meals/Calcitriol. 5. Peripheral vascular disease status post bilateral BKA  6. Protein calorie malnutrition with weight loss: Alb remains very low. Adding supps. 7. Status post  orthotopic liver transplant: Continue immunosuppressive regimen    Veneta Penton, PA-C 08/01/2016, 10:30 AM  [facility] Pager: [phone]  Patient seen and examined, agree with above note with above modifications. Removed 2000 with HD last night- soft BP- only on low dose lopressor.  Plan for HD again tomorrow- 2-3 liters to lower EDW.  Slightly positive troponin- to trend.  If this is only issue possibly could be discharged tomorrow after HD Corliss Parish, MD 08/01/2016

## 2016-08-02 LAB — GLUCOSE, CAPILLARY
Glucose-Capillary: 115 mg/dL — ABNORMAL HIGH (ref 65–99)
Glucose-Capillary: 171 mg/dL — ABNORMAL HIGH (ref 65–99)

## 2016-08-02 LAB — CBC
HCT: 33.8 % — ABNORMAL LOW (ref 36.0–46.0)
HEMOGLOBIN: 10.7 g/dL — AB (ref 12.0–15.0)
MCH: 29.3 pg (ref 26.0–34.0)
MCHC: 31.7 g/dL (ref 30.0–36.0)
MCV: 92.6 fL (ref 78.0–100.0)
Platelets: 282 10*3/uL (ref 150–400)
RBC: 3.65 MIL/uL — ABNORMAL LOW (ref 3.87–5.11)
RDW: 17.8 % — AB (ref 11.5–15.5)
WBC: 7.7 10*3/uL (ref 4.0–10.5)

## 2016-08-02 LAB — RENAL FUNCTION PANEL
ALBUMIN: 1.7 g/dL — AB (ref 3.5–5.0)
ANION GAP: 9 (ref 5–15)
BUN: 13 mg/dL (ref 6–20)
CALCIUM: 8.8 mg/dL — AB (ref 8.9–10.3)
CO2: 28 mmol/L (ref 22–32)
Chloride: 98 mmol/L — ABNORMAL LOW (ref 101–111)
Creatinine, Ser: 3.94 mg/dL — ABNORMAL HIGH (ref 0.44–1.00)
GFR calc Af Amer: 13 mL/min — ABNORMAL LOW (ref >60)
GFR calc non Af Amer: 11 mL/min — ABNORMAL LOW (ref >60)
GLUCOSE: 112 mg/dL — AB (ref 65–99)
PHOSPHORUS: 4.8 mg/dL — AB (ref 2.5–4.6)
Potassium: 3.8 mmol/L (ref 3.5–5.1)
SODIUM: 135 mmol/L (ref 135–145)

## 2016-08-02 LAB — PROTIME-INR
INR: 2.34
Prothrombin Time: 26.1 seconds — ABNORMAL HIGH (ref 11.4–15.2)

## 2016-08-02 MED ORDER — WARFARIN SODIUM 2 MG PO TABS
2.0000 mg | ORAL_TABLET | Freq: Once | ORAL | Status: DC
  Filled 2016-08-02: qty 1

## 2016-08-02 MED ORDER — HEPARIN SODIUM (PORCINE) 1000 UNIT/ML DIALYSIS: 1000.0000 [IU] | INTRAMUSCULAR | Status: DC | PRN

## 2016-08-02 MED ORDER — SODIUM CHLORIDE 0.9 % IV SOLN: 100.0000 mL | INTRAVENOUS | Status: DC | PRN

## 2016-08-02 MED ORDER — CALCITRIOL 0.5 MCG PO CAPS
ORAL_CAPSULE | ORAL | Status: AC
  Filled 2016-08-02: qty 1

## 2016-08-02 MED ORDER — LIDOCAINE HCL (PF) 1 % IJ SOLN: 5.0000 mL | INTRAMUSCULAR | Status: DC | PRN

## 2016-08-02 MED ORDER — ALTEPLASE 2 MG IJ SOLR: 2.0000 mg | Freq: Once | INTRAMUSCULAR | Status: DC | PRN

## 2016-08-02 MED ORDER — LIDOCAINE-PRILOCAINE 2.5-2.5 % EX CREA: 1.0000 "application " | TOPICAL_CREAM | CUTANEOUS | Status: DC | PRN

## 2016-08-02 MED ORDER — PENTAFLUOROPROP-TETRAFLUOROETH EX AERO: 1.0000 "application " | INHALATION_SPRAY | CUTANEOUS | Status: DC | PRN

## 2016-08-02 NOTE — Progress Notes (Signed)
PT Cancellation Note  Patient Details Name: [name] MRN: [id number] DOB: 08-Jul-1955   Cancelled Treatment:    Reason Eval/Treat Not Completed: Patient at procedure or test/unavailable.  Patient in HD.  Will return at later date.   Despina Pole 08/02/2016, 12:51 PM Carita Pian. Sanjuana Kava, [place] Pager [phone]

## 2016-08-02 NOTE — Progress Notes (Signed)
[place] for coumadin  Indication: history of mechanical AVR  Allergies  Allergen Reactions  . Acetaminophen Other (See Comments)    Liver transplant recipient   . Codeine Itching  . Mirtazapine Other (See Comments)    hallucination    Patient Measurements: Height: 5\' 4"  (162.6 cm) Weight: 108 lb 0.4 oz (49 kg) IBW/kg (Calculated) : 54.7  Vital Signs: Temp: 98.8 F (37.1 C) (06/23 0853) Temp Source: Oral (06/23 0853) BP: 109/52 (06/23 0853) Pulse Rate: 67 (06/23 0853)  Labs:  Recent Labs  08/01/16 0214 08/01/16 0540 08/01/16 0803 08/01/16 1825 08/02/16 0356  HGB 10.6*  --   --   --  10.7*  HCT 32.3*  --   --   --  33.8*  PLT 287  --   --   --  282  LABPROT  --   --   --  26.3* 26.1*  INR  --   --   --  2.36 2.34  CREATININE 3.24* 2.22*  --   --  3.94*  TROPONINI 0.38*  --  0.39*  --   --     Assessment 61 yo female on coumadin prior to arrival for history of mechanical AVR. INR this AM is subtherapeutic at 2.34 (goal 2.5 -3.5). CBC stable. Prior to arrival coumadin dose is 2.5 mg on Sunday, Tuesday and Thursday and 1.25 mg all other days. Recent dose decrease by coumadin clinic on 6/21.  Goal of Therapy:  INR 2.5 - 3.5 Monitor platelets by anticoagulation protocol: Yes   Plan:  1. Coumadin 2 mg x 1 this evening  2. Daily INR  Myer Peer Grayland Ormond), PharmD  PGY1 Pharmacy Resident Pager: [phone] 08/02/2016 9:29 AM

## 2016-08-02 NOTE — Progress Notes (Signed)
Discharge instructions and medications discussed with patient.  All questions answered.  

## 2016-08-02 NOTE — Discharge Instructions (Signed)
End-Stage Kidney Disease °End-stage kidney disease occurs when the kidneys are so damaged that they cannot do their job. The kidneys are two organs that do many important jobs in the body, which include: °· Removing wastes and extra fluids from the blood. °· Making hormones that maintain the amount of fluid in your tissues and blood vessels. °· Maintaining the right amount of fluids and chemicals in the body. ° °When the kidneys are damaged and cannot do their job, life-threatening problems occur. Without the help of the kidneys, toxins build up in the blood. In end-stage kidney disease, the kidneys cannot get better. °What are the causes? °End-stage kidney disease usually occurs when a long-lasting (chronic) kidney disease gets worse. It may also occur after the kidneys are suddenly damaged (acute kidney injury). °What increases the risk? °This condition is more likely to develop in people who are: °· Older than age 60. °· Female. °· Of African-American descent. °· Current smokers or former smokers. °· Obese. ° °You may also have an increased risk for end-stage kidney disease if you: °· Have a family history of chronic kidney disease (CKD). °· Have had kidney disease for many years. °· Have other longstanding medical conditions that affect the kidneys, such as: °? Cardiovascular disease including high blood pressure. °? Diabetes. °? Certain diseases that affect the immune system. ° °What are the signs or symptoms? °· Swelling (edema) of the face, legs, ankles, or feet. °· Numbness, tingling, or loss of feeling (sensation) in your hands or feet. °· Tiredness (lethargy). °· Nausea or vomiting. °· Confusion, trouble concentrating, or loss of consciousness. °· Chest pain. °· Shortness of breath. °· Little to no urine production. °· Muscle twitches and cramps, especially in the legs. °· Constant itchiness. °· Loss of appetite. °· Pale skin and tissue lining your eyelids (conjunctiva). °· Headaches. °· Abnormally dark or  light skin. °· Decrease in muscle size (muscle wasting). °· Easy bruising. °· Frequent hiccups. °· Stopping of menstruation in women. °· Seizures. °How is this diagnosed? °Your health care provider will measure your blood pressure and do some tests. These may include: °· Urine tests. °· Blood tests. °· Imaging tests. °· A test in which a sample of tissue is removed from the kidneys to be looked at under a microscope (kidney biopsy). ° °How is this treated? °There are two treatments for end-stage kidney disease: °· A procedure that removes toxic wastes from the body (dialysis). Depending on the type of dialysis you choose, it may be performed more than one time a day (peritoneal dialysis) or several times a week (hemodialysis). °· Surgery to receive a new kidney (kidney transplant). ° °In addition to having dialysis or a kidney transplant, you may need to take medicines: °· To control high blood pressure (hypertension). °· To control cholesterol. °· To maintain healthy electrolyte levels in your blood. ° °You may also be given a specific diet to follow that includes requirements or limits for: °· Salt (sodium). °· Protein. °· Phosphorous. °· Potassium. °· Calcium. ° °Follow these instructions at home: °· Follow your prescribed diet. °· Take over-the-counter and prescription medicines only as told by your health care provider. °? Do not take any new medicines unless approved by your health care provider. Many medicines can worsen your kidney damage. °? Do not take any vitamin and mineral supplements unless approved by your health care provider. Many nutritional supplements can worsen your kidney damage. °? The dose of some medicines that you take may need to be   adjusted. °· Do not use any tobacco products, such as cigarettes, chewing tobacco, and e-cigarettes. If you need help quitting, ask your health care provider. °· Keep all follow-up visits as told by your health care provider. This is important. °· Keep track of  your blood pressure. Report changes in your blood pressure as told by your health care provider. °· Achieve and maintain a healthy weight. If you need help with this, ask your health care provider. °· Start or continue an exercise plan. Try to exercise at least 30 minutes a day, 5 days a week. °· Stay current with immunizations as told by your health care provider. °Where to find more information: °· American Association of Kidney Patients: www.aakp.org °· National Kidney Foundation: www.kidney.org °· American Kidney Fund: www.akfinc.org °· [employer] [facility]: www.lifeoptions.org and www.kidneyschool.org °Contact a health care provider if: °· Your symptoms get worse. °· You develop new symptoms. °Get help right away if: °· You have weakness in an arm or leg on one side of your body. °· You have difficulty speaking or you are slurring your speech. °· You have a sudden change in your vision. °· You have a sudden, severe headache. °· You have a sudden weight increase. °· You have difficulty breathing. °· Your symptoms suddenly get worse. °This information is not intended to replace advice given to you by your health care provider. Make sure you discuss any questions you have with your health care provider. °Document Released: 04/19/2003 Document Revised: 07/05/2015 Document Reviewed: 09/26/2011 °Elsevier Interactive Patient Education © 2017 [employer] ° °

## 2016-08-02 NOTE — Discharge Summary (Signed)
Physician Discharge Summary  FRANCELIA MCLAREN OIN:[id number] DOB: August 20, 1955 DOA: 07/31/2016  PCP: [name], MD  Admit date: 07/31/2016 Discharge date: 08/02/2016  Recommendations for Outpatient Follow-up:  1. Pt will need to follow up with PCP in 1-2 weeks post discharge  Discharge Diagnoses:  Principal Problem:   Acute on chronic respiratory failure with hypoxia ([place]) Active Problems:   Hypothyroidism   Type 2 diabetes mellitus with chronic kidney disease (HCC)   Acute on chronic systolic CHF (congestive heart failure) (HCC)   Chronic anticoagulation w/ coumadin, goal INR 2.5-3.0 w/ AVR   Long term current use of anticoagulant therapy   S/P liver transplant ([place])   ESRD on dialysis ([place])   S/P bilateral BKA (below knee amputation) (HCC)   Fluid overload  Discharge Condition: Stable  Diet recommendation: Renal diet   History of present illness:  Pt is 61 yo female with known ESRD on HD MWF, mechanical aortic valve on chronic AC, combined CHF EF 70-96%, grade 3 diastolic CHF (28/3662), B/L BKA, presented to [facility] ED with shortness of breath and chest discomfort. Patient presented to [facility] initially and was found to have oxygen saturation 85% on RA. This was thought to be due to volume overload and pt was transferred to [facility] for HD.   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, neuropathy  - resume home regimen    Active Problems:   Hypothyroidism - keep on synthroid     Acute on chronic  systolic CHF (congestive heart failure) ([place]), elevated troponin, demand ischemia  - volume control with HD  - no chest pain  - EDW 49 kg     Chronic anticoagulation w/ coumadin, goal INR 2.5-3.0 w/ AVR - resume coumadin     S/P liver transplant ([place]) - continue Prograf     HTN, essential - continue Metoprolol     S/P bilateral BKA (below knee amputation) ([place]) - continue on ASA  DVT prophylaxis: Coumadin  Code Status: No Intubation but OK to initiate code and use medications per ACLS Family Communication: daughter at bedside  Disposition Plan: home   Consultants:   Nephrology   Procedures:   None  Antimicrobials:   None Procedures/Studies: Dg Chest 2 View  Result Date: 07/19/2016 CLINICAL DATA:  Shortness of breath. EXAM: CHEST  2 VIEW COMPARISON:  07/17/2016 and prior radiographs FINDINGS: Cardiomegaly, aortic valve replacement and right IJ central venous catheter with tip overlying the superior cavoatrial junction again noted. A moderate to large right pleural effusion appears slightly increased in size. A small left pleural effusion is again noted. Mild interstitial opacities are again identified likely representing mild interstitial edema. Bilateral lower lung atelectasis again noted. There is no evidence of pneumothorax. IMPRESSION: Moderate to large right pleural effusion which appears slightly increased since 07/17/2016. No other significant changes. Small left pleural effusion, probable mild interstitial pulmonary edema and bilateral lower lobe atelectasis again noted. Electronically Signed   By: Dellis Filbert  Hu M.D.   On: 07/19/2016 10:30   Dg Chest 2 View  Result Date: 07/17/2016 CLINICAL DATA:  Shortness of breath. History of liver transplant, coronary artery disease, valvular heart disease, dialysis dependent renal failure. EXAM: CHEST  2 VIEW COMPARISON:  Chest x-ray of May 28, 2016 FINDINGS: The left lung is adequately inflated. On the right there is mild  volume loss slightly more conspicuous today. A moderate size right pleural effusion is present and has increased slightly since the previous study. The heart is mildly enlarged. There is a prosthetic aortic valve. The sternal wires are intact. There is calcification in the wall of the aortic arch. The central pulmonary vascularity is prominent. The dialysis catheter tip projects at the cavoatrial junction. IMPRESSION: Slight interval increase in the size of the right pleural effusion such that it is moderate in size. A small left pleural effusion is stable. There is mild interstitial prominence bilaterally compatible with edema. Stable mild cardiomegaly with central pulmonary vascular congestion. Electronically Signed   By: David  Martinique M.D.   On: 07/17/2016 10:10    Discharge Exam: Vitals:   08/02/16 1212 08/02/16 1217  BP: (!) 114/58 134/67  Pulse: 69 73  Resp:    Temp:     Vitals:   08/02/16 0853 08/02/16 1205 08/02/16 1212 08/02/16 1217  BP: (!) 109/52 (!) 115/52 (!) 114/58 134/67  Pulse: 67 70 69 73  Resp: 18 16    Temp: 98.8 F (37.1 C) 98.1 F (36.7 C)    TempSrc: Oral Oral    SpO2: 100% 97%    Weight:  48.9 kg (107 lb 12.9 oz)    Height:        General: Pt is alert, follows commands appropriately, not in acute distress Cardiovascular: Regular rate and rhythm, S1/S2 +, no murmurs, no rubs, no gallops Respiratory: Clear to auscultation bilaterally, no wheezing, no crackles, no rhonchi Abdominal: Soft, non tender, non distended, bowel sounds +, no guarding   Discharge Instructions   Allergies as of 08/02/2016      Reactions   Acetaminophen Other (See Comments)   Liver transplant recipient    Codeine Itching   Mirtazapine Other (See Comments)   hallucination      Medication List    TAKE these medications   aspirin EC 81 MG tablet Take 81 mg by mouth daily at 12 noon.   calcitRIOL 0.5 MCG capsule Commonly known as:  ROCALTROL Take 1 capsule (0.5 mcg total) by  mouth every Monday, Wednesday, and Friday with hemodialysis.   calcium acetate 667 MG capsule Commonly known as:  PHOSLO Take 1 capsule (667 mg total) by mouth 3 (three) times daily with meals.   enoxaparin 60 MG/0.6ML injection Commonly known as:  LOVENOX Inject 0.5 mLs (50 mg total) into the skin daily.   feeding supplement Liqd Take 1 Container by mouth 3 (three) times daily between meals.   gabapentin 300 MG capsule Commonly known as:  NEURONTIN Take 1 capsule (300 mg total) by mouth at bedtime.   levothyroxine 175 MCG tablet Commonly known as:  SYNTHROID, LEVOTHROID Take 1 tablet (175 mcg total) by mouth daily at 12 noon.   methocarbamol 500 MG tablet Commonly known as:  ROBAXIN Take 0.5 tablets (250 mg total) by mouth every 6 (six) hours as needed for muscle spasms.   metoCLOPramide 5 MG tablet Commonly known as:  REGLAN Take 1 tablet (5 mg total) by mouth 2 (two) times daily before a meal. Decrease to once a  day before breakfast after a couple of weeks.   metoprolol tartrate 25 MG tablet Commonly known as:  LOPRESSOR Take 0.5 tablets (12.5 mg total) by mouth 2 (two) times daily.   multivitamin Tabs tablet Take 1 tablet by mouth at bedtime.   nitroGLYCERIN 0.4 MG SL tablet Commonly known as:  NITROSTAT Place 1 tablet (0.4 mg total) under the tongue every 5 (five) minutes as needed for chest pain.   Oxycodone HCl 10 MG Tabs Take 1 tablet (10 mg total) by mouth every 4 (four) hours as needed for moderate pain.   polyethylene glycol packet Commonly known as:  MIRALAX / GLYCOLAX Take 17 g by mouth 2 (two) times daily. What changed:  when to take this  reasons to take this  additional instructions   tacrolimus 1 MG capsule Commonly known as:  PROGRAF Take 2 capsules (2 mg total) by mouth 2 (two) times daily. Take 2 capsules twice daily. What changed:  additional instructions   TUMS PO Take 1 tablet by mouth daily with lunch.   warfarin 2.5 MG  tablet Commonly known as:  COUMADIN Take 1 tablet (2.5 mg total) by mouth daily at 6 PM.      Follow-up Information    [name], MD Follow up.   Specialty:  Family Medicine Contact information: [address] [place] [phone]            The results of significant diagnostics from this hospitalization (including imaging, microbiology, ancillary and laboratory) are listed below for reference.     Microbiology: No results found for this or any previous visit (from the past 240 hour(s)).   Labs: Basic Metabolic Panel:  Recent Labs Lab 08/01/16 0214 08/01/16 0540 08/02/16 0356  NA 133* 134* 135  K 3.5 3.8 3.8  CL 98* 99* 98*  CO2 29 30 28   GLUCOSE 94 78 112*  BUN 10 <5* 13  CREATININE 3.24* 2.22* 3.94*  CALCIUM 8.2* 8.1* 8.8*  PHOS 4.1 3.0 4.8*   Liver Function Tests:  Recent Labs Lab 08/01/16 0214 08/01/16 0540 08/02/16 0356  ALBUMIN 1.7* 1.7* 1.7*   CBC:  Recent Labs Lab 08/01/16 0214 08/02/16 0356  WBC 9.2 7.7  HGB 10.6* 10.7*  HCT 32.3* 33.8*  MCV 90.7 92.6  PLT 287 282   Cardiac Enzymes:  Recent Labs Lab 08/01/16 0214 08/01/16 0803  TROPONINI 0.38* 0.39*   BNP: BNP (last 3 results) No results for input(s): BNP in the last 8760 hours.  ProBNP (last 3 results) No results for input(s): PROBNP in the last 8760 hours.  CBG:  Recent Labs Lab 08/01/16 1139 08/01/16 1651 08/01/16 2132 08/02/16 0756 08/02/16 1133  GLUCAP 105* 172* 130* 115* 171*    SIGNED: Time coordinating discharge:  30 minutes  Faye Ramsay, MD  [facility] 08/02/2016, 1:26 PM Pager [phone]  If 7PM-7AM, please contact night-coverage [email or web address] Password [email or web address]

## 2016-08-02 NOTE — Progress Notes (Signed)
[facility] Progress Note   Subjective:  Feels ok today, no dyspnea. Off oxygen. Didn't sleep well last night due to phantom pains. Hopes to be discharged today after HD.  Objective Vitals:   08/01/16 2133 08/02/16 0523 08/02/16 0633 08/02/16 0853  BP: 95/66 (!) 85/37 (!) 123/45 (!) 109/52  Pulse: 73 74  67  Resp: 18 16  18   Temp: 98.4 F (36.9 C) 99 F (37.2 C)  98.8 F (37.1 C)  TempSrc: Oral Oral  Oral  SpO2: 97% 100% (!) 75% 100%  Weight:      Height:       Physical Exam General: Frail female, NAD Heart: RRR; 2/6 murmur Lungs: CTAB Abdomen: soft, non-tender Extremities: No stump edema Dialysis Access: TDC, L 1st BVT present + thrill  Additional Objective Labs: Basic Metabolic Panel:  Recent Labs Lab 08/01/16 0214 08/01/16 0540 08/02/16 0356  NA 133* 134* 135  K 3.5 3.8 3.8  CL 98* 99* 98*  CO2 29 30 28   GLUCOSE 94 78 112*  BUN 10 <5* 13  CREATININE 3.24* 2.22* 3.94*  CALCIUM 8.2* 8.1* 8.8*  PHOS 4.1 3.0 4.8*   Liver Function Tests:  Recent Labs Lab 08/01/16 0214 08/01/16 0540 08/02/16 0356  ALBUMIN 1.7* 1.7* 1.7*   CBC:  Recent Labs Lab 08/01/16 0214 08/02/16 0356  WBC 9.2 7.7  HGB 10.6* 10.7*  HCT 32.3* 33.8*  MCV 90.7 92.6  PLT 287 282   Cardiac Enzymes:  Recent Labs Lab 08/01/16 0214 08/01/16 0803  TROPONINI 0.38* 0.39*   CBG:  Recent Labs Lab 08/01/16 0813 08/01/16 1139 08/01/16 1651 08/01/16 2132 08/02/16 0756  GLUCAP 80 105* 172* 130* 115*   Medications:  . aspirin EC  81 mg Oral Q1200  . calcitRIOL  0.5 mcg Oral Q M,W,F-HD  . calcium acetate  667 mg Oral TID WC  . calcium carbonate  1 tablet Oral Q lunch  . feeding supplement (NEPRO CARB STEADY)  237 mL Oral BID BM  . feeding supplement (PRO-STAT SUGAR FREE 64)  30 mL Oral BID  . gabapentin  300 mg Oral QHS  . insulin aspart  0-9 Units Subcutaneous TID WC  . levothyroxine  175 mcg Oral Q1200  . metoprolol tartrate  12.5 mg Oral BID  .  multivitamin  1 tablet Oral QHS  . pantoprazole  40 mg Oral Daily  . tacrolimus  2 mg Oral BID  . Warfarin - Pharmacist Dosing Inpatient   Does not apply q1800    Dialysis Orders: Monday/Wednesday/Friday, [facility], 3-1/2 hours, 180 dialyzer, BFR 450/dialysate flow AF 1.5, 3K/2.25 calcium, no UF profile, linear sodium modeling, Mircera75 g every 2 weeks (last dose 6/15), calcitriol 0.5 g 3 days a week @HD , EDW 49kg  Assessment/Plan: 1. Volume overload/pulmonary edema: X-ray report reviewed from [facility] and patient examined to corroborate findings.+ recent weight loss, estimated dry weight will require to be lowered: trying for 48kg today. S/p emergent hemodialysis overnight 6/21, planning for HD today, then back to usual schedule. 2. End-stage renal disease: MWF schedule. S/p HD overnight 6/21 to EDW (49kg), will plan another today, then back to MWF schedule. Tofollow-up with vascular surgery and undergo evaluation of first stage brachiobasilic fistula for possible transposition in 6 weeks. 3. Anemia of chronic kidney disease: Hgb 10.7; not due for ESA yet. 4. Metabolic bone disease:Ca/Phos ok. Continue Phoslo with meals/Calcitriol. 5. Peripheral vascular disease status post bilateral BKA 6. Protein calorie malnutrition with weight loss: Alb remains very  low. Continue supps. 7. Status post orthotopic liver transplant:Continue immunosuppressive regimen.  Veneta Penton, PA-C 08/02/2016, 9:01 AM  [facility] Pager: [phone]  Patient seen and examined, agree with above note with above modifications. Feels better today- asking if she can go home after HD- would be OK by [place] Corliss Parish, MD 08/02/2016

## 2016-08-04 DIAGNOSIS — D631 Anemia in chronic kidney disease: Secondary | ICD-10-CM | POA: Diagnosis not present

## 2016-08-04 DIAGNOSIS — N186 End stage renal disease: Secondary | ICD-10-CM | POA: Diagnosis not present

## 2016-08-04 DIAGNOSIS — E876 Hypokalemia: Secondary | ICD-10-CM | POA: Diagnosis not present

## 2016-08-04 DIAGNOSIS — N2581 Secondary hyperparathyroidism of renal origin: Secondary | ICD-10-CM | POA: Diagnosis not present

## 2016-08-04 DIAGNOSIS — E118 Type 2 diabetes mellitus with unspecified complications: Secondary | ICD-10-CM | POA: Diagnosis not present

## 2016-08-06 ENCOUNTER — Ambulatory Visit (INDEPENDENT_AMBULATORY_CARE_PROVIDER_SITE_OTHER): Payer: Self-pay | Admitting: Vascular Surgery

## 2016-08-06 ENCOUNTER — Encounter: Payer: Self-pay | Admitting: Vascular Surgery

## 2016-08-06 VITALS — BP 111/64 | HR 70 | Temp 97.0°F | Resp 16 | Ht 64.0 in | Wt 101.0 lb

## 2016-08-06 DIAGNOSIS — E118 Type 2 diabetes mellitus with unspecified complications: Secondary | ICD-10-CM | POA: Diagnosis not present

## 2016-08-06 DIAGNOSIS — N186 End stage renal disease: Secondary | ICD-10-CM | POA: Diagnosis not present

## 2016-08-06 DIAGNOSIS — E876 Hypokalemia: Secondary | ICD-10-CM | POA: Diagnosis not present

## 2016-08-06 DIAGNOSIS — N2581 Secondary hyperparathyroidism of renal origin: Secondary | ICD-10-CM | POA: Diagnosis not present

## 2016-08-06 DIAGNOSIS — D631 Anemia in chronic kidney disease: Secondary | ICD-10-CM | POA: Diagnosis not present

## 2016-08-06 DIAGNOSIS — Z48812 Encounter for surgical aftercare following surgery on the circulatory system: Secondary | ICD-10-CM

## 2016-08-06 NOTE — Progress Notes (Signed)
Patient name: [name] MRN: [id number] DOB: 10-12-1955 Sex: female  REASON FOR VISIT:    Follow up after right below the knee amputation.  HPI:   [name] is a pleasant 61 y.o. female who is status post previous left below the knee amputation. She presented with rest pain of the right foot with no options for revascularization. On 07/08/2016 she underwent a right below the knee amputation. She returns for a one-month follow up visit and for staple removal.  Of note, during that admission while she was in rehabilitation she did have a left first stage brachial vein fistula performed by Dr. Adele Barthel. This was on 07/21/2016. She dialyzes with a tunneled dialysis catheter. She does note some tingling in her fingertips but no significant pain otherwise.  Current Outpatient Prescriptions  Medication Sig Dispense Refill  . aspirin EC 81 MG tablet Take 81 mg by mouth daily at 12 noon.     . calcitRIOL (ROCALTROL) 0.5 MCG capsule Take 1 capsule (0.5 mcg total) by mouth every Monday, Wednesday, and Friday with hemodialysis. 30 capsule 1  . calcium acetate (PHOSLO) 667 MG capsule Take 1 capsule (667 mg total) by mouth 3 (three) times daily with meals. 90 capsule 1  . Calcium Carbonate Antacid (TUMS PO) Take 1 tablet by mouth daily with lunch.    . enoxaparin (LOVENOX) 60 MG/0.6ML injection Inject 0.5 mLs (50 mg total) into the skin daily. 15 Syringe 0  . feeding supplement (BOOST / RESOURCE BREEZE) LIQD Take 1 Container by mouth 3 (three) times daily between meals. 1 Container 100  . gabapentin (NEURONTIN) 300 MG capsule Take 1 capsule (300 mg total) by mouth at bedtime. 30 capsule 1  . levothyroxine (SYNTHROID, LEVOTHROID) 175 MCG tablet Take 1 tablet (175 mcg total) by mouth daily at 12 noon. 30 tablet 1  . methocarbamol (ROBAXIN) 500 MG tablet Take 0.5 tablets (250 mg total) by mouth every 6 (six) hours as needed for muscle spasms. 60 tablet 0  . metoCLOPramide (REGLAN) 5 MG tablet  Take 1 tablet (5 mg total) by mouth 2 (two) times daily before a meal. Decrease to once a day before breakfast after a couple of weeks. 60 tablet 0  . metoprolol tartrate (LOPRESSOR) 25 MG tablet Take 0.5 tablets (12.5 mg total) by mouth 2 (two) times daily. 60 tablet 1  . multivitamin (RENA-VIT) TABS tablet Take 1 tablet by mouth at bedtime. 30 tablet 0  . nitroGLYCERIN (NITROSTAT) 0.4 MG SL tablet Place 1 tablet (0.4 mg total) under the tongue every 5 (five) minutes as needed for chest pain. 30 tablet 0  . oxyCODONE 10 MG TABS Take 1 tablet (10 mg total) by mouth every 4 (four) hours as needed for moderate pain. 30 tablet 0  . polyethylene glycol (MIRALAX / GLYCOLAX) packet Take 17 g by mouth 2 (two) times daily. (Patient taking differently: Take 17 g by mouth 2 (two) times daily as needed (constipation). Mix in 8 oz liquid and drink) 60 each 0  . tacrolimus (PROGRAF) 1 MG capsule Take 2 capsules (2 mg total) by mouth 2 (two) times daily. Take 2 capsules twice daily. (Patient taking differently: Take 2 mg by mouth 2 (two) times daily. ) 120 capsule 0  . warfarin (COUMADIN) 2.5 MG tablet Take 1 tablet (2.5 mg total) by mouth daily at 6 PM. 30 tablet 0   No current facility-administered medications for this visit.     REVIEW OF SYSTEMS:  [X]  denotes positive  finding, [ ]  denotes negative finding Cardiac  Comments:  Chest pain or chest pressure:    Shortness of breath upon exertion:    Short of breath when lying flat:    Irregular heart rhythm:    Constitutional    Fever or chills:     PHYSICAL EXAM:   Vitals:   08/06/16 1534  BP: 111/64  Pulse: 70  Resp: 16  Temp: 97 F (36.1 C)  TempSrc: Oral  SpO2: 97%  Weight: 101 lb (45.8 kg)  Height: 5\' 4"  (1.626 m)    GENERAL: The patient is a well-nourished female, in no acute distress. The vital signs are documented above. CARDIOVASCULAR: There is a regular rate and rhythm. PULMONARY: There is good air exchange bilaterally without  wheezing or rales. EXTREMITIES: Her right below the knee amputation site is healing nicely and her staples were removed in the office today. She did have a small wound on the lateral aspect of her left below the knee amputation and this has almost completely healed. I cannot palpate a left radial pulse. She has a good thrill in her fistula.  DATA:   No new data.  MEDICAL ISSUES:   STATUS POST RIGHT BELOW THE KNEE AMPUTATION: The patient is doing well status post a right below the knee amputation. Incision is healing nicely. I have given her a prescription for bilateral below the knee prostheses. I will see her back as needed.  STATUS POST FIRST STAGE BRACHIAL BRACHIAL TRANSPOSITION: The fistula has a good thrill. She will keep her regularly scheduled appointment with Dr. Adele Barthel to determine when the second stage can be performed. I've encouraged her to keep a close eye on her symptoms in the left hand. If her symptoms progress she could be developing some early steal symptoms.  [name] [facility] [phone]

## 2016-08-07 ENCOUNTER — Telehealth: Payer: Self-pay

## 2016-08-07 NOTE — Telephone Encounter (Signed)
Patients daughter [name]) has called stating mom has had her staples removed mom is an a lot of pain and her pain medication Oxycodone has ran out. Patient appointment isn't schedule until 08/22/2016. Please give [place] call.

## 2016-08-07 NOTE — Telephone Encounter (Signed)
May call in tramadol 50mg  1 QID #60 no refill  She saw her surgeon yesterday, I want meds to come from surgery at their staple removal visit

## 2016-08-07 NOTE — Telephone Encounter (Signed)
Patient discharged from [facility] 07/24/2016. Please advise on refill.  Patient's hospital f/u is not until 08/22/2016

## 2016-08-08 ENCOUNTER — Ambulatory Visit (INDEPENDENT_AMBULATORY_CARE_PROVIDER_SITE_OTHER): Payer: Medicare Other | Admitting: Pharmacist

## 2016-08-08 DIAGNOSIS — Z4781 Encounter for orthopedic aftercare following surgical amputation: Secondary | ICD-10-CM | POA: Diagnosis not present

## 2016-08-08 DIAGNOSIS — R05 Cough: Secondary | ICD-10-CM | POA: Diagnosis not present

## 2016-08-08 DIAGNOSIS — L89152 Pressure ulcer of sacral region, stage 2: Secondary | ICD-10-CM | POA: Diagnosis not present

## 2016-08-08 DIAGNOSIS — N2581 Secondary hyperparathyroidism of renal origin: Secondary | ICD-10-CM | POA: Diagnosis not present

## 2016-08-08 DIAGNOSIS — E1151 Type 2 diabetes mellitus with diabetic peripheral angiopathy without gangrene: Secondary | ICD-10-CM | POA: Diagnosis not present

## 2016-08-08 DIAGNOSIS — I249 Acute ischemic heart disease, unspecified: Secondary | ICD-10-CM | POA: Diagnosis not present

## 2016-08-08 DIAGNOSIS — I5042 Chronic combined systolic (congestive) and diastolic (congestive) heart failure: Secondary | ICD-10-CM | POA: Diagnosis not present

## 2016-08-08 DIAGNOSIS — D631 Anemia in chronic kidney disease: Secondary | ICD-10-CM | POA: Diagnosis not present

## 2016-08-08 DIAGNOSIS — N186 End stage renal disease: Secondary | ICD-10-CM | POA: Diagnosis not present

## 2016-08-08 DIAGNOSIS — R0600 Dyspnea, unspecified: Secondary | ICD-10-CM | POA: Diagnosis not present

## 2016-08-08 DIAGNOSIS — I132 Hypertensive heart and chronic kidney disease with heart failure and with stage 5 chronic kidney disease, or end stage renal disease: Secondary | ICD-10-CM | POA: Diagnosis not present

## 2016-08-08 DIAGNOSIS — Z7901 Long term (current) use of anticoagulants: Secondary | ICD-10-CM

## 2016-08-08 DIAGNOSIS — R112 Nausea with vomiting, unspecified: Secondary | ICD-10-CM | POA: Diagnosis not present

## 2016-08-08 DIAGNOSIS — E1122 Type 2 diabetes mellitus with diabetic chronic kidney disease: Secondary | ICD-10-CM | POA: Diagnosis not present

## 2016-08-08 DIAGNOSIS — R9431 Abnormal electrocardiogram [ECG] [EKG]: Secondary | ICD-10-CM | POA: Diagnosis not present

## 2016-08-08 DIAGNOSIS — J690 Pneumonitis due to inhalation of food and vomit: Secondary | ICD-10-CM | POA: Diagnosis not present

## 2016-08-08 DIAGNOSIS — E876 Hypokalemia: Secondary | ICD-10-CM | POA: Diagnosis not present

## 2016-08-08 DIAGNOSIS — E118 Type 2 diabetes mellitus with unspecified complications: Secondary | ICD-10-CM | POA: Diagnosis not present

## 2016-08-08 LAB — PROTIME-INR: INR: 5.3 — AB (ref <=1.1)

## 2016-08-08 MED ORDER — TRAMADOL HCL 50 MG PO TABS: 50.0000 mg | ORAL_TABLET | Freq: Four times a day (QID) | ORAL | 0 refills | Status: DC

## 2016-08-08 NOTE — Telephone Encounter (Signed)
Meds ordered and called into pharmacy, on informing patient of actions taken, she told me that the surgeon told her that they will NOT be ordering any meds for her as they do not prescribe at all and that WE WILL be taking care of all her pain medications.

## 2016-08-09 ENCOUNTER — Inpatient Hospital Stay (HOSPITAL_COMMUNITY)
Admission: AD | Admit: 2016-08-09 | Discharge: 2016-08-14 | DRG: 981 | Disposition: A | Discharge status: Home / Self Care | Payer: Medicare Other | Source: Other Acute Inpatient Hospital | Attending: Internal Medicine | Admitting: Internal Medicine

## 2016-08-09 ENCOUNTER — Inpatient Hospital Stay (HOSPITAL_COMMUNITY): Payer: Medicare Other

## 2016-08-09 ENCOUNTER — Encounter (HOSPITAL_COMMUNITY): Payer: Self-pay | Admitting: Family Medicine

## 2016-08-09 DIAGNOSIS — T82898A Other specified complication of vascular prosthetic devices, implants and grafts, initial encounter: Secondary | ICD-10-CM | POA: Diagnosis not present

## 2016-08-09 DIAGNOSIS — D649 Anemia, unspecified: Secondary | ICD-10-CM | POA: Diagnosis present

## 2016-08-09 DIAGNOSIS — I340 Nonrheumatic mitral (valve) insufficiency: Secondary | ICD-10-CM | POA: Diagnosis not present

## 2016-08-09 DIAGNOSIS — R0602 Shortness of breath: Secondary | ICD-10-CM | POA: Diagnosis not present

## 2016-08-09 DIAGNOSIS — I509 Heart failure, unspecified: Secondary | ICD-10-CM

## 2016-08-09 DIAGNOSIS — I5043 Acute on chronic combined systolic (congestive) and diastolic (congestive) heart failure: Secondary | ICD-10-CM | POA: Diagnosis not present

## 2016-08-09 DIAGNOSIS — T8640 Unspecified complication of liver transplant: Secondary | ICD-10-CM | POA: Diagnosis not present

## 2016-08-09 DIAGNOSIS — Z4781 Encounter for orthopedic aftercare following surgical amputation: Secondary | ICD-10-CM | POA: Diagnosis not present

## 2016-08-09 DIAGNOSIS — J810 Acute pulmonary edema: Secondary | ICD-10-CM | POA: Diagnosis not present

## 2016-08-09 DIAGNOSIS — E1121 Type 2 diabetes mellitus with diabetic nephropathy: Secondary | ICD-10-CM | POA: Diagnosis present

## 2016-08-09 DIAGNOSIS — K219 Gastro-esophageal reflux disease without esophagitis: Secondary | ICD-10-CM | POA: Diagnosis present

## 2016-08-09 DIAGNOSIS — I120 Hypertensive chronic kidney disease with stage 5 chronic kidney disease or end stage renal disease: Secondary | ICD-10-CM

## 2016-08-09 DIAGNOSIS — Z89511 Acquired absence of right leg below knee: Secondary | ICD-10-CM

## 2016-08-09 DIAGNOSIS — I5021 Acute systolic (congestive) heart failure: Secondary | ICD-10-CM | POA: Diagnosis not present

## 2016-08-09 DIAGNOSIS — Z7901 Long term (current) use of anticoagulants: Secondary | ICD-10-CM

## 2016-08-09 DIAGNOSIS — Z954 Presence of other heart-valve replacement: Secondary | ICD-10-CM | POA: Diagnosis not present

## 2016-08-09 DIAGNOSIS — I10 Essential (primary) hypertension: Secondary | ICD-10-CM

## 2016-08-09 DIAGNOSIS — R079 Chest pain, unspecified: Secondary | ICD-10-CM | POA: Diagnosis not present

## 2016-08-09 DIAGNOSIS — I25119 Atherosclerotic heart disease of native coronary artery with unspecified angina pectoris: Secondary | ICD-10-CM | POA: Diagnosis not present

## 2016-08-09 DIAGNOSIS — E1122 Type 2 diabetes mellitus with diabetic chronic kidney disease: Secondary | ICD-10-CM | POA: Diagnosis present

## 2016-08-09 DIAGNOSIS — Z944 Liver transplant status: Secondary | ICD-10-CM | POA: Diagnosis not present

## 2016-08-09 DIAGNOSIS — Z952 Presence of prosthetic heart valve: Secondary | ICD-10-CM | POA: Diagnosis not present

## 2016-08-09 DIAGNOSIS — J9621 Acute and chronic respiratory failure with hypoxia: Secondary | ICD-10-CM | POA: Diagnosis not present

## 2016-08-09 DIAGNOSIS — G8929 Other chronic pain: Secondary | ICD-10-CM | POA: Diagnosis present

## 2016-08-09 DIAGNOSIS — R791 Abnormal coagulation profile: Secondary | ICD-10-CM

## 2016-08-09 DIAGNOSIS — E1151 Type 2 diabetes mellitus with diabetic peripheral angiopathy without gangrene: Secondary | ICD-10-CM | POA: Diagnosis present

## 2016-08-09 DIAGNOSIS — Z7982 Long term (current) use of aspirin: Secondary | ICD-10-CM

## 2016-08-09 DIAGNOSIS — Z79899 Other long term (current) drug therapy: Secondary | ICD-10-CM

## 2016-08-09 DIAGNOSIS — Z89512 Acquired absence of left leg below knee: Secondary | ICD-10-CM | POA: Diagnosis not present

## 2016-08-09 DIAGNOSIS — R9431 Abnormal electrocardiogram [ECG] [EKG]: Secondary | ICD-10-CM | POA: Diagnosis not present

## 2016-08-09 DIAGNOSIS — J449 Chronic obstructive pulmonary disease, unspecified: Secondary | ICD-10-CM | POA: Diagnosis present

## 2016-08-09 DIAGNOSIS — E1152 Type 2 diabetes mellitus with diabetic peripheral angiopathy with gangrene: Secondary | ICD-10-CM | POA: Diagnosis not present

## 2016-08-09 DIAGNOSIS — M549 Dorsalgia, unspecified: Secondary | ICD-10-CM | POA: Diagnosis present

## 2016-08-09 DIAGNOSIS — I5041 Acute combined systolic (congestive) and diastolic (congestive) heart failure: Secondary | ICD-10-CM

## 2016-08-09 DIAGNOSIS — E669 Obesity, unspecified: Secondary | ICD-10-CM | POA: Diagnosis present

## 2016-08-09 DIAGNOSIS — E43 Unspecified severe protein-calorie malnutrition: Secondary | ICD-10-CM | POA: Diagnosis present

## 2016-08-09 DIAGNOSIS — I2510 Atherosclerotic heart disease of native coronary artery without angina pectoris: Secondary | ICD-10-CM | POA: Diagnosis present

## 2016-08-09 DIAGNOSIS — E1140 Type 2 diabetes mellitus with diabetic neuropathy, unspecified: Secondary | ICD-10-CM | POA: Diagnosis present

## 2016-08-09 DIAGNOSIS — E039 Hypothyroidism, unspecified: Secondary | ICD-10-CM | POA: Diagnosis not present

## 2016-08-09 DIAGNOSIS — N186 End stage renal disease: Secondary | ICD-10-CM | POA: Diagnosis not present

## 2016-08-09 DIAGNOSIS — D62 Acute posthemorrhagic anemia: Secondary | ICD-10-CM | POA: Diagnosis not present

## 2016-08-09 DIAGNOSIS — T82898S Other specified complication of vascular prosthetic devices, implants and grafts, sequela: Secondary | ICD-10-CM | POA: Diagnosis not present

## 2016-08-09 DIAGNOSIS — J690 Pneumonitis due to inhalation of food and vomit: Secondary | ICD-10-CM | POA: Diagnosis not present

## 2016-08-09 DIAGNOSIS — Z951 Presence of aortocoronary bypass graft: Secondary | ICD-10-CM

## 2016-08-09 DIAGNOSIS — Z992 Dependence on renal dialysis: Secondary | ICD-10-CM

## 2016-08-09 DIAGNOSIS — T82898D Other specified complication of vascular prosthetic devices, implants and grafts, subsequent encounter: Secondary | ICD-10-CM | POA: Diagnosis not present

## 2016-08-09 DIAGNOSIS — Y829 Unspecified medical devices associated with adverse incidents: Secondary | ICD-10-CM | POA: Diagnosis present

## 2016-08-09 DIAGNOSIS — I132 Hypertensive heart and chronic kidney disease with heart failure and with stage 5 chronic kidney disease, or end stage renal disease: Secondary | ICD-10-CM | POA: Diagnosis present

## 2016-08-09 HISTORY — DX: End stage renal disease: N18.6

## 2016-08-09 HISTORY — DX: End stage renal disease: Z99.2

## 2016-08-09 LAB — RENAL FUNCTION PANEL
ALBUMIN: 1.8 g/dL — AB (ref 3.5–5.0)
Anion gap: 11 (ref 5–15)
BUN: 18 mg/dL (ref 6–20)
CO2: 29 mmol/L (ref 22–32)
Calcium: 8.7 mg/dL — ABNORMAL LOW (ref 8.9–10.3)
Chloride: 93 mmol/L — ABNORMAL LOW (ref 101–111)
Creatinine, Ser: 4.33 mg/dL — ABNORMAL HIGH (ref 0.44–1.00)
GFR calc Af Amer: 12 mL/min — ABNORMAL LOW (ref >60)
GFR calc non Af Amer: 10 mL/min — ABNORMAL LOW (ref >60)
GLUCOSE: 153 mg/dL — AB (ref 65–99)
PHOSPHORUS: 6.7 mg/dL — AB (ref 2.5–4.6)
POTASSIUM: 5.7 mmol/L — AB (ref 3.5–5.1)
Sodium: 133 mmol/L — ABNORMAL LOW (ref 135–145)

## 2016-08-09 LAB — COMPREHENSIVE METABOLIC PANEL
ALK PHOS: 294 U/L — AB (ref 38–126)
ALT: 31 U/L (ref 14–54)
AST: 62 U/L — ABNORMAL HIGH (ref 15–41)
Albumin: 1.8 g/dL — ABNORMAL LOW (ref 3.5–5.0)
Anion gap: 11 (ref 5–15)
BILIRUBIN TOTAL: 1.8 mg/dL — AB (ref 0.3–1.2)
BUN: 17 mg/dL (ref 6–20)
CALCIUM: 8.8 mg/dL — AB (ref 8.9–10.3)
CO2: 27 mmol/L (ref 22–32)
CREATININE: 4.35 mg/dL — AB (ref 0.44–1.00)
Chloride: 95 mmol/L — ABNORMAL LOW (ref 101–111)
GFR, EST AFRICAN AMERICAN: 12 mL/min — AB (ref >60)
GFR, EST NON AFRICAN AMERICAN: 10 mL/min — AB (ref >60)
Glucose, Bld: 158 mg/dL — ABNORMAL HIGH (ref 65–99)
Potassium: 5.7 mmol/L — ABNORMAL HIGH (ref 3.5–5.1)
SODIUM: 133 mmol/L — AB (ref 135–145)
Total Protein: 7.8 g/dL (ref 6.5–8.1)

## 2016-08-09 LAB — GLUCOSE, CAPILLARY
Glucose-Capillary: 143 mg/dL — ABNORMAL HIGH (ref 65–99)
Glucose-Capillary: 145 mg/dL — ABNORMAL HIGH (ref 65–99)

## 2016-08-09 LAB — CBC
HCT: 37.5 % (ref 36.0–46.0)
Hemoglobin: 12.4 g/dL (ref 12.0–15.0)
MCH: 29.4 pg (ref 26.0–34.0)
MCHC: 33.1 g/dL (ref 30.0–36.0)
MCV: 88.9 fL (ref 78.0–100.0)
PLATELETS: 179 10*3/uL (ref 150–400)
RBC: 4.22 MIL/uL (ref 3.87–5.11)
RDW: 16.8 % — ABNORMAL HIGH (ref 11.5–15.5)
WBC: 10.3 10*3/uL (ref 4.0–10.5)

## 2016-08-09 LAB — MRSA PCR SCREENING: MRSA BY PCR: NEGATIVE

## 2016-08-09 LAB — TROPONIN I: TROPONIN I: 0.48 ng/mL — AB (ref <0.03)

## 2016-08-09 LAB — BRAIN NATRIURETIC PEPTIDE

## 2016-08-09 MED ORDER — POLYETHYLENE GLYCOL 3350 17 G PO PACK
17.0000 g | PACK | Freq: Two times a day (BID) | ORAL | Status: DC
  Administered 2016-08-10 – 2016-08-14 (×6): 17 g via ORAL
  Filled 2016-08-09 (×8): qty 1

## 2016-08-09 MED ORDER — SODIUM CHLORIDE 0.9 % IV SOLN: 100.0000 mL | INTRAVENOUS | Status: DC | PRN

## 2016-08-09 MED ORDER — TRAMADOL HCL 50 MG PO TABS
50.0000 mg | ORAL_TABLET | Freq: Four times a day (QID) | ORAL | Status: DC
  Administered 2016-08-10 – 2016-08-13 (×8): 50 mg via ORAL
  Filled 2016-08-09 (×8): qty 1

## 2016-08-09 MED ORDER — ALTEPLASE 2 MG IJ SOLR: 2.0000 mg | Freq: Once | INTRAMUSCULAR | Status: DC | PRN

## 2016-08-09 MED ORDER — BOOST / RESOURCE BREEZE PO LIQD
1.0000 | Freq: Three times a day (TID) | ORAL | Status: DC
  Administered 2016-08-10 – 2016-08-13 (×7): 1 via ORAL

## 2016-08-09 MED ORDER — CALCIUM ACETATE (PHOS BINDER) 667 MG PO CAPS
667.0000 mg | ORAL_CAPSULE | Freq: Three times a day (TID) | ORAL | Status: DC
  Administered 2016-08-10 – 2016-08-14 (×9): 667 mg via ORAL
  Filled 2016-08-09 (×9): qty 1

## 2016-08-09 MED ORDER — LEVOTHYROXINE SODIUM 75 MCG PO TABS
175.0000 ug | ORAL_TABLET | Freq: Every day | ORAL | Status: DC
  Administered 2016-08-10 – 2016-08-14 (×5): 175 ug via ORAL
  Filled 2016-08-09 (×6): qty 1

## 2016-08-09 MED ORDER — METOCLOPRAMIDE HCL 5 MG PO TABS
5.0000 mg | ORAL_TABLET | Freq: Two times a day (BID) | ORAL | Status: DC
  Administered 2016-08-10 – 2016-08-13 (×4): 5 mg via ORAL
  Filled 2016-08-09 (×4): qty 1

## 2016-08-09 MED ORDER — METOPROLOL TARTRATE 12.5 MG HALF TABLET
12.5000 mg | ORAL_TABLET | Freq: Two times a day (BID) | ORAL | Status: DC
  Administered 2016-08-10: 12.5 mg via ORAL
  Filled 2016-08-09: qty 1

## 2016-08-09 MED ORDER — LIDOCAINE-PRILOCAINE 2.5-2.5 % EX CREA
1.0000 | TOPICAL_CREAM | CUTANEOUS | Status: DC | PRN
Start: 2016-08-09 — End: 2016-08-10
  Filled 2016-08-09: qty 5

## 2016-08-09 MED ORDER — PENTAFLUOROPROP-TETRAFLUOROETH EX AERO: 1.0000 "application " | INHALATION_SPRAY | CUTANEOUS | Status: DC | PRN

## 2016-08-09 MED ORDER — GABAPENTIN 300 MG PO CAPS
300.0000 mg | ORAL_CAPSULE | Freq: Every day | ORAL | Status: DC
  Administered 2016-08-12 – 2016-08-13 (×2): 300 mg via ORAL
  Filled 2016-08-09 (×4): qty 1

## 2016-08-09 MED ORDER — NITROGLYCERIN 0.4 MG SL SUBL: 0.4000 mg | SUBLINGUAL_TABLET | SUBLINGUAL | Status: DC | PRN

## 2016-08-09 MED ORDER — LIDOCAINE HCL (PF) 1 % IJ SOLN: 5.0000 mL | INTRAMUSCULAR | Status: DC | PRN

## 2016-08-09 MED ORDER — HEPARIN SODIUM (PORCINE) 1000 UNIT/ML DIALYSIS
1000.0000 [IU] | INTRAMUSCULAR | Status: DC | PRN
  Administered 2016-08-10: 1000 [IU] via INTRAVENOUS_CENTRAL
  Filled 2016-08-09 (×2): qty 1

## 2016-08-09 MED ORDER — CALCITRIOL 0.5 MCG PO CAPS
0.5000 ug | ORAL_CAPSULE | ORAL | Status: DC
  Administered 2016-08-11 – 2016-08-13 (×3): 0.5 ug via ORAL
  Filled 2016-08-09 (×2): qty 1

## 2016-08-09 MED ORDER — METHOCARBAMOL 500 MG PO TABS: 250.0000 mg | ORAL_TABLET | Freq: Four times a day (QID) | ORAL | Status: DC | PRN

## 2016-08-09 MED ORDER — ASPIRIN 81 MG PO CHEW
81.0000 mg | CHEWABLE_TABLET | Freq: Every day | ORAL | Status: DC
  Administered 2016-08-10 – 2016-08-14 (×4): 81 mg via ORAL
  Filled 2016-08-09 (×4): qty 1

## 2016-08-09 NOTE — Procedures (Signed)
Pt placed on BiPAP and is tolerating well.

## 2016-08-09 NOTE — Progress Notes (Signed)
Patient transported to dialysis on bipap. 12/6 50%.  Patient tolerating.  Wean off bipap as able during or after dialysis per MD Naaman Plummer.  Vitals stable throughout.

## 2016-08-09 NOTE — H&P (Signed)
History and Physical    [name] LDJ:[id number] DOB: 1955-11-26  DOA: 08/09/2016 PCP: System, Pcp Not In  Patient coming from: [facility] hospital  Chief Complaint: SOB and chest pain   HPI: [name] is a 61 y.o. 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 Patient was found to be hypoxic with mild elevated troponin and some EKG changed and was transferred to [facility] for further evaluation.   On my evaluation patient is in severe respiratory distress. She is huffing and puffing. She has difficulty speaking in full sentences, but is able to tell me that she is not having CP at this moment, is "just hard to breath"   ED Course at [facility]: records reviewed - Patient was treated with abx for possible PNA, was given Nitro and ASA with improvement of chest pain. Labs notable for mild elevated troponin 0.50 x 3, Elevate lactic acid and INR of 4.1. Respiratory alkalosis on ABG. CXR with increase in vascular congestion.   Per chart review patient was in a Picnic the day PTA, and suddenly became SOB with CP. Patient had dialysis on 08/08/16 where she was dialyzed below her dry weight.    Review of Systems: Unable to obtain due to severe respiratory distress  Past Medical History:  Diagnosis Date  . Anemia    takes Folic Acid daily  . Anxiety   . Aortic stenosis   . Arthritis    "left hand, back" (08/30/2012)  . Asthma   . CAD (coronary artery disease)   . CAD (coronary artery disease) Jan. 2015   Cath: 20% LAD, 50% D1; s/p LIMA-LAD  . Chest pain 03/06/2015  . CHF (congestive heart failure) ([place]) 05/2016  . Chronic back pain   . Chronic bronchitis ([place])    "q yr w/season changes" (08/30/2012)  . Chronic constipation    takes MIralax and Colace daily  . COPD (chronic obstructive pulmonary disease) ([place])   . Depression     takes Cymbalta for "severe" depression  . End stage renal disease on dialysis [facility]) 02/27/2011   Kidneys shut down at time of liver transplant in Sept 2011 at [facility] in [place], she has been on HD ever since.  Dialyzes at [facility] HD on TTS schedule.  Had L forearm graft used 10 months then removed Dec 2012 due to suspected infection.  A right upper arm AVG was placed Dec 2012 but she developed steal symptoms acutely and it was ligated the same day.  Never had an AV fistula due to small veins.  Now has L thigh AVG put in Jan 2013, has not clotted to date.    [name] GERD (gastroesophageal reflux disease)    takes Omeprazole daily  . Headache    "at least monthly" (08/30/2012)  . Hepatitis C   . History of blood transfusion    "several" (08/30/2012)  . Hypertension    takes Metoprolol and Lisinopril daily, sees Dr Bea Graff  . Hypothyroidism    takes Synthroid daily  . Migraine    "last migraine was in 2013" (08/30/2012)  . Neuromuscular disorder ([place])    carpal tunnel in right hand  . Obesity   . Peripheral vascular disease (HCC) hands and legs  . Pneumonia    "today and several times before" (08/30/2012)  . S/P aortic valve replacement 03/15/13  Mechanical   . 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 S/P unilateral BKA (below knee amputation), left ([place]) 04/30/2016  . SVT (supraventricular tachycardia) ([place]) 06/09/14  . Tobacco abuse   . Type II diabetes mellitus (HCC)    Levemir 2units daily if > 150    Past Surgical History:  Procedure Laterality Date  . ABDOMINAL AORTOGRAM W/LOWER EXTREMITY N/A 07/04/2016   Procedure: Abdominal Aortogram w/Lower Extremity;  Surgeon: Elam Dutch, MD;  Location: [place] CV LAB;  Service: Cardiovascular;  Laterality: N/A;  . AMPUTATION Left 04/24/2016   Procedure: AMPUTATION BELOW KNEE;  Surgeon: Angelia Mould, MD;  Location: [place];  Service: Vascular;   Laterality: Left;  . AMPUTATION Right 07/08/2016   Procedure: AMPUTATION BELOW KNEE;  Surgeon: Angelia Mould, MD;  Location: [place];  Service: Vascular;  Laterality: Right;  . AORTIC VALVE REPLACEMENT N/A 03/15/2013   AVR; Surgeon: Ivin Poot, MD;  Location: [facility] OR; Open Heart Surgery;  58mm[employer] mechanical prosthesis, top hat valve  . ARTERIOVENOUS GRAFT PLACEMENT Left 10/03/10    forearm  . AV FISTULA PLACEMENT  01/29/2011   Procedure: INSERTION OF ARTERIOVENOUS (AV) GORE-TEX GRAFT ARM;  Surgeon: Elam Dutch, MD;  Location: [facility] OR;  Service: Vascular;  Laterality: Right;  . AV FISTULA PLACEMENT  03/10/2011   Procedure: INSERTION OF ARTERIOVENOUS (AV) GORE-TEX GRAFT THIGH;  Surgeon: Elam Dutch, MD;  Location: [place];  Service: Vascular;  Laterality: Left;  . AV FISTULA PLACEMENT Left 07/21/2016   Procedure: CREATION OF LEFT UPPER ARM ARTERIOVENOUS (AV) FISTULA;  Surgeon: Conrad Glasco, MD;  Location: [place];  Service: Vascular;  Laterality: Left;  . [place] REMOVAL  12/23/2010   Procedure: REMOVAL OF ARTERIOVENOUS GORETEX GRAFT ([place]);  Surgeon: Elam Dutch, MD;  Location: [place];  Service: Vascular;  Laterality: Left;  procedure started @1736 -1852  . CHOLECYSTECTOMY  1993  . CORONARY ARTERY BYPASS GRAFT N/A 03/15/2013   Procedure: CORONARY ARTERY BYPASS GRAFTING (CABG) times one using left internal mammary artery.;  Surgeon: Ivin Poot, MD;  Location: [place];  Service: Open Heart Surgery;  Laterality: N/A;  POSS CABG X 1  . CYSTOSCOPY  1990's  . INSERTION OF DIALYSIS CATHETER  12/23/2010   Procedure: INSERTION OF DIALYSIS CATHETER;  Surgeon: Elam Dutch, MD;  Location: [place];  Service: Vascular;  Laterality: Right;  Right Internal Jugular 28cm dialysis catheter insertion procedure time 1701-1720   . INSERTION OF DIALYSIS CATHETER Right 03/11/2016   Procedure: INSERTION OF DIALYSIS CATHETER;  Surgeon: Angelia Mould, MD;  Location: [place];  Service: Vascular;   Laterality: Right;  . INTRAOPERATIVE TRANSESOPHAGEAL ECHOCARDIOGRAM N/A 03/15/2013   Procedure: INTRAOPERATIVE TRANSESOPHAGEAL ECHOCARDIOGRAM;  Surgeon: Ivin Poot, MD;  Location: [place];  Service: Open Heart Surgery;  Laterality: N/A;  . LEFT HEART CATHETERIZATION WITH CORONARY ANGIOGRAM N/A 07/29/2012   Procedure: LEFT HEART CATHETERIZATION WITH CORONARY ANGIOGRAM;  Surgeon: Troy Sine, MD;  Location: [facility] CATH LAB;  Service: Cardiovascular;  Laterality: N/A;  . LEFT HEART CATHETERIZATION WITH CORONARY ANGIOGRAM N/A 03/10/2013   Procedure: LEFT HEART CATHETERIZATION WITH CORONARY ANGIOGRAM;  Surgeon: Troy Sine, MD;  Location: [facility] CATH LAB;  Service: Cardiovascular;  Laterality: N/A;  . LEFT HEART CATHETERIZATION WITH CORONARY/GRAFT ANGIOGRAM N/A 12/24/2011   Procedure: LEFT HEART CATHETERIZATION WITH Beatrix Fetters;  Surgeon: [name], MD;  Location: [place] CATH LAB;  Service: Cardiovascular;  Laterality: N/A;  . LEFT HEART CATHETERIZATION WITH CORONARY/GRAFT ANGIOGRAM N/A 12/16/2013   Procedure: LEFT HEART CATHETERIZATION WITH Beatrix Fetters;  Surgeon: Troy Sine, MD;  Location: [facility] CATH LAB;  Service: Cardiovascular;  Laterality: N/A;  . LIGATION ARTERIOVENOUS GORTEX GRAFT Left 03/11/2016   Procedure: LIGATION THIGH ARTERIOVENOUS GORTEX GRAFT;  Surgeon: Angelia Mould, MD;  Location: [place];  Service: Vascular;  Laterality: Left;  . LIVER TRANSPLANT  10/25/2009   sees Dr Ferol Luz 1 every 6 months, saw last in Dec 2013. [name] Coord [phone]  . PERIPHERAL VASCULAR CATHETERIZATION N/A 11/06/2015   Procedure: Abdominal Aortogram;  Surgeon: Serafina Mitchell, MD;  Location: [place] CV LAB;  Service: Cardiovascular;  Laterality: N/A;  . PERIPHERAL VASCULAR CATHETERIZATION N/A 11/06/2015   Procedure: Lower Extremity Angiography;  Surgeon: Serafina Mitchell, MD;  Location: [place] CV LAB;  Service: Cardiovascular;  Laterality: N/A;  . PERIPHERAL  VASCULAR CATHETERIZATION  11/06/2015   Procedure: Peripheral Vascular Atherectomy;  Surgeon: Serafina Mitchell, MD;  Location: [place] CV LAB;  Service: Cardiovascular;;  Left Superficial femoral  . SHUNTOGRAM Left 05/15/2014   Procedure: SHUNTOGRAM;  Surgeon: Conrad [place], MD;  Location: [facility] CATH LAB;  Service: Cardiovascular;  Laterality: Left;  . SMALL INTESTINE SURGERY  90's  . SPINAL GROWTH RODS  2010   "put 2 metal rods in my back; they had detetriorated" (08/30/2012)  . THROMBECTOMY    . THROMBECTOMY AND REVISION OF ARTERIOVENTOUS (AV) GORETEX  GRAFT Left 03/30/2014  . THROMBECTOMY AND REVISION OF ARTERIOVENTOUS (AV) GORETEX  GRAFT Left 03/30/2014   Procedure: THROMBECTOMY AND REVISION OF ARTERIOVENTOUS (AV) GORETEX  GRAFT;  Surgeon: Conrad [place], MD;  Location: [place];  Service: Vascular;  Laterality: Left;  . TUBAL LIGATION  1990's     reports that she has been smoking Cigarettes.  She has a 20.00 pack-year smoking history. She has never used smokeless tobacco. She reports that she does not drink alcohol or use drugs.  Allergies  Allergen Reactions  . Acetaminophen Other (See Comments)    Liver transplant recipient   . Codeine Itching  . Mirtazapine Other (See Comments)    hallucination    Family History  Problem Relation Age of Onset  . Cancer Mother   . Diabetes Mother   . Hypertension Mother   . Stroke Mother   . Cancer Father   . Anesthesia problems Neg Hx   . Hypotension Neg Hx   . Malignant hyperthermia Neg Hx   . Pseudochol deficiency Neg Hx     Prior to Admission medications   Medication Sig Start Date End Date Taking? Authorizing Provider  aspirin EC 81 MG tablet Take 81 mg by mouth daily at 12 noon.     [provider]  calcitRIOL (ROCALTROL) 0.5 MCG capsule Take 1 capsule (0.5 mcg total) by mouth every Monday, Wednesday, and Friday with hemodialysis. 07/25/16   Angiulli, Lavon Paganini, PA-C  calcium acetate (PHOSLO) 667 MG capsule Take 1 capsule (667 mg  total) by mouth 3 (three) times daily with meals. 07/24/16   Angiulli, Lavon Paganini, PA-C  Calcium Carbonate Antacid (TUMS PO) Take 1 tablet by mouth daily with lunch.    [provider]  enoxaparin (LOVENOX) 60 MG/0.6ML injection Inject 0.5 mLs (50 mg total) into the skin daily. 07/25/16   Angiulli, Lavon Paganini, PA-C  feeding supplement (BOOST / RESOURCE BREEZE) LIQD Take 1 Container by mouth 3 (three)  times daily between meals. 04/25/16   Reyne Dumas, MD  gabapentin (NEURONTIN) 300 MG capsule Take 1 capsule (300 mg total) by mouth at bedtime. 07/24/16   Angiulli, Lavon Paganini, PA-C  levothyroxine (SYNTHROID, LEVOTHROID) 175 MCG tablet Take 1 tablet (175 mcg total) by mouth daily at 12 noon. 07/24/16   Angiulli, Lavon Paganini, PA-C  methocarbamol (ROBAXIN) 500 MG tablet Take 0.5 tablets (250 mg total) by mouth every 6 (six) hours as needed for muscle spasms. 07/24/16   Angiulli, Lavon Paganini, PA-C  metoCLOPramide (REGLAN) 5 MG tablet Take 1 tablet (5 mg total) by mouth 2 (two) times daily before a meal. Decrease to once a day before breakfast after a couple of weeks. 07/24/16   Angiulli, Lavon Paganini, PA-C  metoprolol tartrate (LOPRESSOR) 25 MG tablet Take 0.5 tablets (12.5 mg total) by mouth 2 (two) times daily. 07/24/16   Angiulli, Lavon Paganini, PA-C  multivitamin (RENA-VIT) TABS tablet Take 1 tablet by mouth at bedtime. 05/20/16   Love, Ivan Anchors, PA-C  nitroGLYCERIN (NITROSTAT) 0.4 MG SL tablet Place 1 tablet (0.4 mg total) under the tongue every 5 (five) minutes as needed for chest pain. 03/20/14   Elgergawy, Silver Hug[place]enin, MD  oxyCODONE 10 MG TABS Take 1 tablet (10 mg total) by mouth every 4 (four) hours as needed for moderate pain. 07/24/16   Angiulli, Lavon Paganini, PA-C  polyethylene glycol (MIRALAX / GLYCOLAX) packet Take 17 g by mouth 2 (two) times daily. Patient taking differently: Take 17 g by mouth 2 (two) times daily as needed (constipation). Mix in 8 oz liquid and drink 05/20/16   Love, Ivan Anchors, PA-C  tacrolimus (PROGRAF) 1  MG capsule Take 2 capsules (2 mg total) by mouth 2 (two) times daily. Take 2 capsules twice daily. Patient taking differently: Take 2 mg by mouth 2 (two) times daily.  11/29/15   Mikhail, Velta Addison, DO  traMADol (ULTRAM) 50 MG tablet Take 1 tablet (50 mg total) by mouth 4 (four) times daily. 08/08/16   Kirsteins, Luanna Salk, MD  warfarin (COUMADIN) 2.5 MG tablet Take 1 tablet (2.5 mg total) by mouth daily at 6 PM. 07/24/16   Angiulli, Lavon Paganini, PA-C    Physical Exam: Vitals:   08/09/16 1724  BP: (!) 159/70  Resp: 16  Temp: 97.7 F (36.5 C)  TempSrc: Oral  SpO2: 99%  Weight: 49.9 kg (110 lb)  Height: 5\' 4"  (1.626 m)     Constitutional: Severe respiratory distress, on 6L [place]  Eyes: PERRL ENMT: Mucous membranes are moist.  Neck: normal, supple, no masses, no thyromegaly, R IJ Respiratory: Breath sound diminished b/l crackles up to 1/3 b/l, use of accessory muscles, tachypnea  Cardiovascular: RRR, systolic murmur with metallic click. No JVD noted   Abdomen: no tenderness, no masses palpated. No hepatosplenomegaly. Bowel sounds positive.  Musculoskeletal: B/l BKA, no edema  Skin: no rashes, lesions, ulcers. No induration Neurologic: CN 2-12 grossly intact.  Psychiatric:  Alert and oriented x 3.  Labs on Admission: I have personally reviewed following labs and imaging studies  CBC: No results for input(s): WBC, NEUTROABS, HGB, HCT, MCV, PLT in the last 168 hours. Basic Metabolic Panel: No results for input(s): NA, K, CL, CO2, GLUCOSE, BUN, CREATININE, CALCIUM, MG, PHOS in the last 168 hours. GFR: Estimated Creatinine Clearance: 11.8 mL/min (A) (by C-G formula based on SCr of 3.94 mg/dL (H)). Liver Function Tests: No results for input(s): AST, ALT, ALKPHOS, BILITOT, PROT, ALBUMIN in the last 168 hours. No results for input(s):  LIPASE, AMYLASE in the last 168 hours. No results for input(s): AMMONIA in the last 168 hours. Coagulation Profile:  Recent Labs Lab 08/08/16  INR 5.3*    Cardiac Enzymes: No results for input(s): CKTOTAL, CKMB, CKMBINDEX, TROPONINI in the last 168 hours. BNP (last 3 results) No results for input(s): PROBNP in the last 8760 hours. HbA1C: No results for input(s): HGBA1C in the last 72 hours. CBG: No results for input(s): GLUCAP in the last 168 hours. Lipid Profile: No results for input(s): CHOL, HDL, LDLCALC, TRIG, CHOLHDL, LDLDIRECT in the last 72 hours. Thyroid Function Tests: No results for input(s): TSH, T4TOTAL, FREET4, T3FREE, THYROIDAB in the last 72 hours. Anemia Panel: No results for input(s): VITAMINB12, FOLATE, FERRITIN, TIBC, IRON, RETICCTPCT in the last 72 hours. Urine analysis:    Component Value Date/Time   COLORURINE YELLOW 11/02/2012 0428   APPEARANCEUR CLOUDY (A) 11/02/2012 0428   LABSPEC 1.011 11/02/2012 0428   PHURINE 8.0 11/02/2012 0428   GLUCOSEU 250 (A) 11/02/2012 0428   HGBUR LARGE (A) 11/02/2012 0428   BILIRUBINUR NEGATIVE 11/02/2012 0428   KETONESUR NEGATIVE 11/02/2012 0428   PROTEINUR >300 (A) 11/02/2012 0428   UROBILINOGEN 0.2 11/02/2012 0428   NITRITE NEGATIVE 11/02/2012 0428   LEUKOCYTESUR NEGATIVE 11/02/2012 0428   Sepsis Labs: !!!!!!!!!!!!!!!!!!!!!!!!!!!!!!!!!!!!!!!!!!!! @LABRCNTIP (procalcitonin:4,lacticidven:4) )No results found for this or any previous visit (from the past 240 hour(s)).   Radiological Exams on Admission: No results found.  EKG from [facility] Independently reviewed. Normal sinus rhythm with T- wave inversion on Lead I, II and AVL, and increase in ST depression on V5-V6.   Assessment/Plan Acute on chronic respiratory failure with hypoxia - likely due Pulmonary edema ? Contributing factor ACS vs non compliance with diet vs progressive CHF. Initial presentation substernal chest pain, SOB, palpitations and SOB. She was recently discharge 6/23 after being admitted with fluid overload.  Clinical picture consistent with pulmonary edema given, elevated blood pressure, chest  x-ray with worsening edema and severe shortness of breath. 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 Family Communication: None at bedside attempted to call family but no answer  Disposition Plan: Anticipate discharge to previous home environment.  Consults called: Cardiology Fellow, Nephrology [place] Admission status: Inpatient    Chipper Oman MD [facility] Pager: Text Page via [email or web address]  [phone]  If 7PM-7AM, please contact night-coverage [email or web address] Password [facility]  08/09/2016, 6:52 PM

## 2016-08-09 NOTE — Progress Notes (Signed)
HD tx initiated via HD cath w/o problem, pull/push/flush equal, slightly sluggish pulls, VSS, will cont to monitor while on HD tx

## 2016-08-09 NOTE — Progress Notes (Signed)
Pt taken off bipap and placed on 3L [place] at this time. Pt denies, sob, no increased WOB, VS within normal limits. RN aware. RT will continue to monitor.

## 2016-08-09 NOTE — Consult Note (Signed)
Renal Service Consult Note [facility]  [name] 08/09/2016 Roney Jaffe D Requesting Physician:  Dr Elon Jester  Reason for Consult:  ESRD pt with SOB HPI: The patient is a 61 y.o. year-old with history of DM, CHF, s/p liver transplant, ESRD on HD, bilat BKA who presented to ED with SOB and CP.  CXR showed pulm edema. We are asked to see for possible dialysis.    Recent admit from 6/21 to 6/23 for pulm edema and lean body wt loss, SOB improved w serial HD  Patient can't provide any history due to resp distress. No CP at this time.  Very hard to breath, no fevers or prod cough.    Dialysis Orders:  [name] MWF 3.5h R IJ cath 3K/2.25 bath48kg Hep none   - Calcitriol 0.5 mcg PO q HD   ROS  denies CP  no joint pain   no HA  no blurry vision  no rash  no diarrhea  no nausea/ vomiting  no dysuria  no difficulty voiding  no change in urine color    Past Medical History  Past Medical History:  Diagnosis Date  . Anemia    takes Folic Acid daily  . Anxiety   . Aortic stenosis   . Arthritis    "left hand, back" (08/30/2012)  . Asthma   . CAD (coronary artery disease)   . CAD (coronary artery disease) Jan. 2015   Cath: 20% LAD, 50% D1; s/p LIMA-LAD  . Chest pain 03/06/2015  . CHF (congestive heart failure) ([place]) 05/2016  . Chronic back pain   . Chronic bronchitis ([place])    "q yr w/season changes" (08/30/2012)  . Chronic constipation    takes MIralax and Colace daily  . COPD (chronic obstructive pulmonary disease) ([place])   . Depression    takes Cymbalta for "severe" depression  . End stage renal disease on dialysis [facility]) 02/27/2011   Kidneys shut down at time of liver transplant in Sept 2011 at [facility] in [place], she has been on HD ever since.  Dialyzes at [facility] HD on TTS schedule.  Had L forearm graft used 10 months then removed Dec 2012 due to suspected infection.  A right upper arm AVG was placed Dec 2012 but she developed steal symptoms acutely  and it was ligated the same day.  Never had an AV fistula due to small veins.  Now has L thigh AVG put in Jan 2013, has not clotted to date.    [name] GERD (gastroesophageal reflux disease)    takes Omeprazole daily  . Headache    "at least monthly" (08/30/2012)  . Hepatitis C   . History of blood transfusion    "several" (08/30/2012)  . Hypertension    takes Metoprolol and Lisinopril daily, sees Dr Bea Graff  . Hypothyroidism    takes Synthroid daily  . Migraine    "last migraine was in 2013" (08/30/2012)  . Neuromuscular disorder ([place])    carpal tunnel in right hand  . Obesity   . Peripheral vascular disease (HCC) hands and legs  . Pneumonia    "today and several times before" (08/30/2012)  . 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 S/P unilateral BKA (below knee amputation), left ([place]) 04/30/2016  . SVT (supraventricular tachycardia) ([place]) 06/09/14  . Tobacco abuse   .  Type II diabetes mellitus (HCC)    Levemir 2units daily if > 150   Past Surgical History  Past Surgical History:  Procedure Laterality Date  . ABDOMINAL AORTOGRAM W/LOWER EXTREMITY N/A 07/04/2016   Procedure: Abdominal Aortogram w/Lower Extremity;  Surgeon: Elam Dutch, MD;  Location: [place] CV LAB;  Service: Cardiovascular;  Laterality: N/A;  . AMPUTATION Left 04/24/2016   Procedure: AMPUTATION BELOW KNEE;  Surgeon: Angelia Mould, MD;  Location: [place];  Service: Vascular;  Laterality: Left;  . AMPUTATION Right 07/08/2016   Procedure: AMPUTATION BELOW KNEE;  Surgeon: Angelia Mould, MD;  Location: [place];  Service: Vascular;  Laterality: Right;  . AORTIC VALVE REPLACEMENT N/A 03/15/2013   AVR; Surgeon: Ivin Poot, MD;  Location: [facility] OR; Open Heart Surgery;  79mm[employer] mechanical prosthesis, top hat valve  . ARTERIOVENOUS GRAFT PLACEMENT Left 10/03/10     forearm  . AV FISTULA PLACEMENT  01/29/2011   Procedure: INSERTION OF ARTERIOVENOUS (AV) GORE-TEX GRAFT ARM;  Surgeon: Elam Dutch, MD;  Location: [facility] OR;  Service: Vascular;  Laterality: Right;  . AV FISTULA PLACEMENT  03/10/2011   Procedure: INSERTION OF ARTERIOVENOUS (AV) GORE-TEX GRAFT THIGH;  Surgeon: Elam Dutch, MD;  Location: [place];  Service: Vascular;  Laterality: Left;  . AV FISTULA PLACEMENT Left 07/21/2016   Procedure: CREATION OF LEFT UPPER ARM ARTERIOVENOUS (AV) FISTULA;  Surgeon: Conrad , MD;  Location: [place];  Service: Vascular;  Laterality: Left;  . [place] REMOVAL  12/23/2010   Procedure: REMOVAL OF ARTERIOVENOUS GORETEX GRAFT ([place]);  Surgeon: Elam Dutch, MD;  Location: [place];  Service: Vascular;  Laterality: Left;  procedure started @1736 -1852  . CHOLECYSTECTOMY  1993  . CORONARY ARTERY BYPASS GRAFT N/A 03/15/2013   Procedure: CORONARY ARTERY BYPASS GRAFTING (CABG) times one using left internal mammary artery.;  Surgeon: Ivin Poot, MD;  Location: [place];  Service: Open Heart Surgery;  Laterality: N/A;  POSS CABG X 1  . CYSTOSCOPY  1990's  . INSERTION OF DIALYSIS CATHETER  12/23/2010   Procedure: INSERTION OF DIALYSIS CATHETER;  Surgeon: Elam Dutch, MD;  Location: [place];  Service: Vascular;  Laterality: Right;  Right Internal Jugular 28cm dialysis catheter insertion procedure time 1701-1720   . INSERTION OF DIALYSIS CATHETER Right 03/11/2016   Procedure: INSERTION OF DIALYSIS CATHETER;  Surgeon: Angelia Mould, MD;  Location: [place];  Service: Vascular;  Laterality: Right;  . INTRAOPERATIVE TRANSESOPHAGEAL ECHOCARDIOGRAM N/A 03/15/2013   Procedure: INTRAOPERATIVE TRANSESOPHAGEAL ECHOCARDIOGRAM;  Surgeon: Ivin Poot, MD;  Location: [place];  Service: Open Heart Surgery;  Laterality: N/A;  . LEFT HEART CATHETERIZATION WITH CORONARY ANGIOGRAM N/A 07/29/2012   Procedure: LEFT HEART CATHETERIZATION WITH CORONARY ANGIOGRAM;  Surgeon: Troy Sine, MD;   Location: [facility] CATH LAB;  Service: Cardiovascular;  Laterality: N/A;  . LEFT HEART CATHETERIZATION WITH CORONARY ANGIOGRAM N/A 03/10/2013   Procedure: LEFT HEART CATHETERIZATION WITH CORONARY ANGIOGRAM;  Surgeon: Troy Sine, MD;  Location: [facility] CATH LAB;  Service: Cardiovascular;  Laterality: N/A;  . LEFT HEART CATHETERIZATION WITH CORONARY/GRAFT ANGIOGRAM N/A 12/24/2011   Procedure: LEFT HEART CATHETERIZATION WITH Beatrix Fetters;  Surgeon: [name], MD;  Location: [facility] CATH LAB;  Service: Cardiovascular;  Laterality: N/A;  . LEFT HEART CATHETERIZATION WITH CORONARY/GRAFT ANGIOGRAM N/A 12/16/2013   Procedure: LEFT HEART CATHETERIZATION WITH Beatrix Fetters;  Surgeon: Troy Sine, MD;  Location: [facility] CATH LAB;  Service: Cardiovascular;  Laterality: N/A;  . LIGATION ARTERIOVENOUS  GORTEX GRAFT Left 03/11/2016   Procedure: LIGATION THIGH ARTERIOVENOUS GORTEX GRAFT;  Surgeon: Angelia Mould, MD;  Location: [place];  Service: Vascular;  Laterality: Left;  . LIVER TRANSPLANT  10/25/2009   sees Dr Ferol Luz 1 every 6 months, saw last in Dec 2013. [name] Coord [phone]  . PERIPHERAL VASCULAR CATHETERIZATION N/A 11/06/2015   Procedure: Abdominal Aortogram;  Surgeon: Serafina Mitchell, MD;  Location: [place] CV LAB;  Service: Cardiovascular;  Laterality: N/A;  . PERIPHERAL VASCULAR CATHETERIZATION N/A 11/06/2015   Procedure: Lower Extremity Angiography;  Surgeon: Serafina Mitchell, MD;  Location: [place] CV LAB;  Service: Cardiovascular;  Laterality: N/A;  . PERIPHERAL VASCULAR CATHETERIZATION  11/06/2015   Procedure: Peripheral Vascular Atherectomy;  Surgeon: Serafina Mitchell, MD;  Location: [place] CV LAB;  Service: Cardiovascular;;  Left Superficial femoral  . SHUNTOGRAM Left 05/15/2014   Procedure: SHUNTOGRAM;  Surgeon: Conrad [place], MD;  Location: [facility] CATH LAB;  Service: Cardiovascular;  Laterality: Left;  . SMALL INTESTINE SURGERY  90's  . SPINAL GROWTH RODS  2010    "put 2 metal rods in my back; they had detetriorated" (08/30/2012)  . THROMBECTOMY    . THROMBECTOMY AND REVISION OF ARTERIOVENTOUS (AV) GORETEX  GRAFT Left 03/30/2014  . THROMBECTOMY AND REVISION OF ARTERIOVENTOUS (AV) GORETEX  GRAFT Left 03/30/2014   Procedure: THROMBECTOMY AND REVISION OF ARTERIOVENTOUS (AV) GORETEX  GRAFT;  Surgeon: Conrad Roxana, MD;  Location: [place];  Service: Vascular;  Laterality: Left;  . TUBAL LIGATION  1990's   Family History  Family History  Problem Relation Age of Onset  . Cancer Mother   . Diabetes Mother   . Hypertension Mother   . Stroke Mother   . Cancer Father   . Anesthesia problems Neg Hx   . Hypotension Neg Hx   . Malignant hyperthermia Neg Hx   . Pseudochol deficiency Neg Hx    Social History  reports that she has been smoking Cigarettes.  She has a 20.00 pack-year smoking history. She has never used smokeless tobacco. She reports that she does not drink alcohol or use drugs. Allergies  Allergies  Allergen Reactions  . Acetaminophen Other (See Comments)    Liver transplant recipient   . Codeine Itching  . Mirtazapine Other (See Comments)    hallucination   Home medications Prior to Admission medications   Medication Sig Start Date End Date Taking? Authorizing Provider  aspirin EC 81 MG tablet Take 81 mg by mouth daily at 12 noon.     [provider]  calcitRIOL (ROCALTROL) 0.5 MCG capsule Take 1 capsule (0.5 mcg total) by mouth every Monday, Wednesday, and Friday with hemodialysis. 07/25/16   Angiulli, Lavon Paganini, PA-C  calcium acetate (PHOSLO) 667 MG capsule Take 1 capsule (667 mg total) by mouth 3 (three) times daily with meals. 07/24/16   Angiulli, Lavon Paganini, PA-C  Calcium Carbonate Antacid (TUMS PO) Take 1 tablet by mouth daily with lunch.    [provider]  enoxaparin (LOVENOX) 60 MG/0.6ML injection Inject 0.5 mLs (50 mg total) into the skin daily. 07/25/16   Angiulli, Lavon Paganini, PA-C  feeding supplement (BOOST / RESOURCE  BREEZE) LIQD Take 1 Container by mouth 3 (three) times daily between meals. 04/25/16   Reyne Dumas, MD  gabapentin (NEURONTIN) 300 MG capsule Take 1 capsule (300 mg total) by mouth at bedtime. 07/24/16   Angiulli, Lavon Paganini, PA-C  levothyroxine (SYNTHROID, LEVOTHROID) 175 MCG tablet Take 1 tablet (175 mcg  total) by mouth daily at 12 noon. 07/24/16   Angiulli, Lavon Paganini, PA-C  methocarbamol (ROBAXIN) 500 MG tablet Take 0.5 tablets (250 mg total) by mouth every 6 (six) hours as needed for muscle spasms. 07/24/16   Angiulli, Lavon Paganini, PA-C  metoCLOPramide (REGLAN) 5 MG tablet Take 1 tablet (5 mg total) by mouth 2 (two) times daily before a meal. Decrease to once a day before breakfast after a couple of weeks. 07/24/16   Angiulli, Lavon Paganini, PA-C  metoprolol tartrate (LOPRESSOR) 25 MG tablet Take 0.5 tablets (12.5 mg total) by mouth 2 (two) times daily. 07/24/16   Angiulli, Lavon Paganini, PA-C  multivitamin (RENA-VIT) TABS tablet Take 1 tablet by mouth at bedtime. 05/20/16   Love, Ivan Anchors, PA-C  nitroGLYCERIN (NITROSTAT) 0.4 MG SL tablet Place 1 tablet (0.4 mg total) under the tongue every 5 (five) minutes as needed for chest pain. 03/20/14   Elgergawy, Silver Huguenin, MD  oxyCODONE 10 MG TABS Take 1 tablet (10 mg total) by mouth every 4 (four) hours as needed for moderate pain. 07/24/16   Angiulli, Lavon Paganini, PA-C  polyethylene glycol (MIRALAX / GLYCOLAX) packet Take 17 g by mouth 2 (two) times daily. Patient taking differently: Take 17 g by mouth 2 (two) times daily as needed (constipation). Mix in 8 oz liquid and drink 05/20/16   Love, Ivan Anchors, PA-C  tacrolimus (PROGRAF) 1 MG capsule Take 2 capsules (2 mg total) by mouth 2 (two) times daily. Take 2 capsules twice daily. Patient taking differently: Take 2 mg by mouth 2 (two) times daily.  11/29/15   Mikhail, Velta Addison, DO  traMADol (ULTRAM) 50 MG tablet Take 1 tablet (50 mg total) by mouth 4 (four) times daily. 08/08/16   Kirsteins, Luanna Salk, MD  warfarin (COUMADIN) 2.5 MG  tablet Take 1 tablet (2.5 mg total) by mouth daily at 6 PM. 07/24/16   Angiulli, Lavon Paganini, PA-C   Liver Function Tests No results for input(s): AST, ALT, ALKPHOS, BILITOT, PROT, ALBUMIN in the last 168 hours. No results for input(s): LIPASE, AMYLASE in the last 168 hours. CBC No results for input(s): WBC, NEUTROABS, HGB, HCT, MCV, PLT in the last 168 hours. Basic Metabolic Panel No results for input(s): NA, K, CL, CO2, GLUCOSE, BUN, CREATININE, CALCIUM, PHOS in the last 168 hours. Iron/TIBC/Ferritin/ %Sat    Component Value Date/Time   IRON 22 (L) 03/12/2016 1308   TIBC 181 (L) 03/12/2016 1308   FERRITIN 1,095 (H) 08/30/2012 1449   IRONPCTSAT 12 03/12/2016 1308    Vitals:   08/09/16 1724  BP: (!) 159/70  Resp: 16  Temp: 97.7 F (36.5 C)  TempSrc: Oral  SpO2: 99%  Weight: 49.9 kg (110 lb)  Height: 5\' 4"  (1.626 m)   Exam Gen in distress, tearful, short rapid breathing w/ ^WOB No rash, cyanosis or gangrene Sclera anicteric, throat clear No jvd or bruits Chest bibasilar crackles 1/4 up RRR no MRG Abd soft ntnd no mass or ascites +bs GU deferred MS no joint effusions or deformity, bilat BKA No LE or UE edema / no wounds or ulcers Neuro is alert, Ox 3 , nonfocal, gen'd weak R IJ TDC  Dialysis: [name] MWF 3.5h R IJ cath 3K/2.25 bath48kg Hep none   - Calcitriol 0.5 mcg PO q HD  CXR bilat pulm edema, R >> L  Assess: 1  Dyspnea/ acute resp failure/ pulm edema - suspect this is vol overload from persistent lean body wt loss.  Was here recently w/ similar  presentation and we lowered dry weight then. Other possibility is ischemic/ anginal episode w/ secondary pulm edema. EKG is pending.   2  ESRD usual HD is MWF. Had 1st stage BVT done on 07/21/16, seen [facility] office for f/u 6/27 and AVF doing well. 2nd stage per DR Bridgett Larsson.  3  Liver transplant - cont prograf 2mg  bid 4  CABG '15 / ICM EF 45% / sp [employer] AVR - on coumadin 5  PVD bilat LE amp   Plan - HD tonight ASAP, bipap  in the meantime ordered  [name] MD [place] pager [phone]   08/09/2016, 7:28 PM

## 2016-08-10 ENCOUNTER — Encounter (HOSPITAL_COMMUNITY): Payer: Self-pay | Admitting: Cardiology

## 2016-08-10 LAB — CBC WITH DIFFERENTIAL/PLATELET
BASOS ABS: 0 10*3/uL (ref 0.0–0.1)
Basophils Relative: 1 %
Eosinophils Absolute: 0 10*3/uL (ref 0.0–0.7)
Eosinophils Relative: 0 %
HEMATOCRIT: 43.1 % (ref 36.0–46.0)
HEMOGLOBIN: 14.2 g/dL (ref 12.0–15.0)
LYMPHS PCT: 14 %
Lymphs Abs: 1.2 10*3/uL (ref 0.7–4.0)
MCH: 29.5 pg (ref 26.0–34.0)
MCHC: 32.9 g/dL (ref 30.0–36.0)
MCV: 89.4 fL (ref 78.0–100.0)
Monocytes Absolute: 1.4 10*3/uL — ABNORMAL HIGH (ref 0.1–1.0)
Monocytes Relative: 17 %
NEUTROS ABS: 5.7 10*3/uL (ref 1.7–7.7)
NEUTROS PCT: 68 %
Platelets: 125 10*3/uL — ABNORMAL LOW (ref 150–400)
RBC: 4.82 MIL/uL (ref 3.87–5.11)
RDW: 17 % — ABNORMAL HIGH (ref 11.5–15.5)
WBC: 8.4 10*3/uL (ref 4.0–10.5)

## 2016-08-10 LAB — BASIC METABOLIC PANEL
ANION GAP: 12 (ref 5–15)
BUN: 14 mg/dL (ref 6–20)
CALCIUM: 9.1 mg/dL (ref 8.9–10.3)
CO2: 25 mmol/L (ref 22–32)
Chloride: 92 mmol/L — ABNORMAL LOW (ref 101–111)
Creatinine, Ser: 3.32 mg/dL — ABNORMAL HIGH (ref 0.44–1.00)
GFR, EST AFRICAN AMERICAN: 16 mL/min — AB (ref >60)
GFR, EST NON AFRICAN AMERICAN: 14 mL/min — AB (ref >60)
GLUCOSE: 155 mg/dL — AB (ref 65–99)
Potassium: 6.5 mmol/L (ref 3.5–5.1)
SODIUM: 129 mmol/L — AB (ref 135–145)

## 2016-08-10 LAB — TROPONIN I
TROPONIN I: 0.44 ng/mL — AB (ref <0.03)
Troponin I: 0.47 ng/mL (ref <0.03)

## 2016-08-10 LAB — GLUCOSE, CAPILLARY
GLUCOSE-CAPILLARY: 63 mg/dL — AB (ref 65–99)
GLUCOSE-CAPILLARY: 64 mg/dL — AB (ref 65–99)
GLUCOSE-CAPILLARY: 70 mg/dL (ref 65–99)
GLUCOSE-CAPILLARY: 108 mg/dL — AB (ref 65–99)

## 2016-08-10 LAB — PROTIME-INR
INR: 2.58
Prothrombin Time: 28.2 seconds — ABNORMAL HIGH (ref 11.4–15.2)

## 2016-08-10 MED ORDER — DIPHENHYDRAMINE HCL 50 MG/ML IJ SOLN
25.0000 mg | Freq: Four times a day (QID) | INTRAMUSCULAR | Status: DC | PRN
  Administered 2016-08-10: 25 mg via INTRAVENOUS
  Filled 2016-08-10: qty 1

## 2016-08-10 MED ORDER — DEXTROSE 50 % IV SOLN
1.0000 | Freq: Once | INTRAVENOUS | Status: AC
Start: 2016-08-10 — End: 2016-08-10
  Administered 2016-08-10: 50 mL via INTRAVENOUS
  Filled 2016-08-10: qty 50

## 2016-08-10 MED ORDER — ONDANSETRON 4 MG PO TBDP
4.0000 mg | ORAL_TABLET | Freq: Three times a day (TID) | ORAL | Status: DC | PRN
  Administered 2016-08-10: 4 mg via ORAL
  Filled 2016-08-10 (×2): qty 1

## 2016-08-10 MED ORDER — TACROLIMUS 1 MG PO CAPS
2.0000 mg | ORAL_CAPSULE | Freq: Two times a day (BID) | ORAL | Status: DC
  Administered 2016-08-10 – 2016-08-14 (×9): 2 mg via ORAL
  Filled 2016-08-10 (×9): qty 2

## 2016-08-10 MED ORDER — WARFARIN SODIUM 2.5 MG PO TABS
2.5000 mg | ORAL_TABLET | Freq: Once | ORAL | Status: AC
  Administered 2016-08-10: 2.5 mg via ORAL
  Filled 2016-08-10: qty 1

## 2016-08-10 MED ORDER — ONDANSETRON HCL 4 MG/2ML IJ SOLN
4.0000 mg | Freq: Four times a day (QID) | INTRAMUSCULAR | Status: DC | PRN
  Administered 2016-08-10: 4 mg via INTRAVENOUS
  Filled 2016-08-10: qty 2

## 2016-08-10 MED ORDER — SODIUM POLYSTYRENE SULFONATE 15 GM/60ML PO SUSP
30.0000 g | Freq: Once | ORAL | Status: AC
  Administered 2016-08-10: 30 g via ORAL
  Filled 2016-08-10: qty 120

## 2016-08-10 MED ORDER — WARFARIN - PHARMACIST DOSING INPATIENT: Freq: Every day | Status: DC

## 2016-08-10 MED ORDER — INSULIN ASPART 100 UNIT/ML ~~LOC~~ SOLN
10.0000 [IU] | Freq: Once | SUBCUTANEOUS | Status: AC
  Administered 2016-08-10: 10 [IU] via SUBCUTANEOUS

## 2016-08-10 MED ORDER — DEXTROSE 10 % IV SOLN
INTRAVENOUS | Status: DC
  Administered 2016-08-10: via INTRAVENOUS

## 2016-08-10 MED ORDER — METOPROLOL TARTRATE 25 MG PO TABS
25.0000 mg | ORAL_TABLET | Freq: Two times a day (BID) | ORAL | Status: DC
  Administered 2016-08-10: 25 mg via ORAL
  Filled 2016-08-10: qty 1

## 2016-08-10 MED ORDER — MORPHINE SULFATE (PF) 2 MG/ML IV SOLN
1.0000 mg | INTRAVENOUS | Status: DC | PRN
  Administered 2016-08-10: 2 mg via INTRAVENOUS
  Administered 2016-08-11: 3 mg via INTRAVENOUS
  Administered 2016-08-11: 2 mg via INTRAVENOUS
  Filled 2016-08-10 (×2): qty 1

## 2016-08-10 NOTE — Progress Notes (Signed)
ANTICOAGULATION CONSULT NOTE - Initial Consult  Pharmacy Consult for Warfarin Indication: Mechanical AVR  Allergies  Allergen Reactions  . Acetaminophen Other (See Comments)    Liver transplant recipient   . Codeine Itching  . Mirtazapine Other (See Comments)    hallucination    Patient Measurements: Height: 5\' 4"  (162.6 cm) (prior to bilat BKA) Weight: 100 lb 12 oz (45.7 kg) IBW/kg (Calculated) : 54.7  Vital Signs: Temp: 98.3 F (36.8 C) (07/01 1138) Temp Source: Oral (07/01 1138) BP: 156/68 (07/01 1138) Pulse Rate: 80 (07/01 1138)  Labs:  Recent Labs  08/08/16 08/09/16 2044 08/09/16 2251 08/10/16 0415 08/10/16 0745  HGB  --  12.4  --   --  14.2  HCT  --  37.5  --   --  43.1  PLT  --  179  --   --  125*  LABPROT  --   --   --   --  28.2*  INR 5.3*  --   --   --  2.58  CREATININE  --  4.35* 4.33*  --   --   TROPONINI  --  0.48*  --  0.47* 0.44*    Estimated Creatinine Clearance: 9.8 mL/min (A) (by C-G formula based on SCr of 4.33 mg/dL (H)).   Medical History: Past Medical History:  Diagnosis Date  . Anemia    takes Folic Acid daily  . Anxiety   . Aortic stenosis   . Arthritis    "left hand, back" (08/30/2012)  . Asthma   . CAD (coronary artery disease)   . CAD (coronary artery disease) Jan. 2015   Cath: 20% LAD, 50% D1; s/p LIMA-LAD  . Chest pain 03/06/2015  . CHF (congestive heart failure) ([place]) 05/2016  . Chronic back pain   . Chronic bronchitis ([place])    "q yr w/season changes" (08/30/2012)  . Chronic constipation    takes MIralax and Colace daily  . COPD (chronic obstructive pulmonary disease) ([place])   . Depression    takes Cymbalta for "severe" depression  . End stage renal disease on dialysis [facility]) 02/27/2011   Kidneys shut down at time of liver transplant in Sept 2011 at [facility] in [place], she has been on HD ever since.  Dialyzes at [facility] HD on TTS schedule.  Had L forearm graft used 10 months then removed Dec 2012 due to suspected  infection.  A right upper arm AVG was placed Dec 2012 but she developed steal symptoms acutely and it was ligated the same day.  Never had an AV fistula due to small veins.  Now has L thigh AVG put in Jan 2013, has not clotted to date.    [name] GERD (gastroesophageal reflux disease)    takes Omeprazole daily  . Headache    "at least monthly" (08/30/2012)  . Hepatitis C   . History of blood transfusion    "several" (08/30/2012)  . Hypertension    takes Metoprolol and Lisinopril daily, sees Dr Bea Graff  . Hypothyroidism    takes Synthroid daily  . Migraine    "last migraine was in 2013" (08/30/2012)  . Neuromuscular disorder ([place])    carpal tunnel in right hand  . Obesity   . Peripheral vascular disease (HCC) hands and legs  . Pneumonia    "today and several times before" (08/30/2012)  . 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 S/P unilateral BKA (below knee amputation), left ([place]) 04/30/2016  . SVT (supraventricular tachycardia) ([place]) 06/09/14  . Tobacco abuse   . Type II diabetes mellitus (HCC)    Levemir 2units daily if > 150    Medications:  Scheduled:  . aspirin  81 mg Oral Daily  . [START ON 08/11/2016] calcitRIOL  0.5 mcg Oral Q M,W,F-HD  . calcium acetate  667 mg Oral TID WC  . feeding supplement  1 Container Oral TID BM  . gabapentin  300 mg Oral QHS  . levothyroxine  175 mcg Oral QAC breakfast  . metoCLOPramide  5 mg Oral BID AC  . metoprolol tartrate  12.5 mg Oral BID  . polyethylene glycol  17 g Oral BID  . tacrolimus  2 mg Oral BID  . traMADol  50 mg Oral QID   Infusions:  . sodium chloride    . sodium chloride      Assessment: 61 yo F with ESRD on HD MWF and h/o liver tx admitted on 08/09/2016 with SOB and CP. Dietary non-compliance requiring emergent HD. On warfarin PTA for mechanical AVR. INR on admission was SUPRAtherapeutic at 5.3.    INR now trended down to goal at 2.58 after 2 held doses. CBC stable with no reported s/s bleeding.  Home dose: 1.25 mg daily except 2.5 mg on T/T/Sun (last dose 6/28 - holding with elevated INR)  Goal of Therapy:  INR 2.5-3.5 Monitor platelets by anticoagulation protocol: Yes   Plan:  - Restart Coumadin 2.5 mg x 1 tonight - Daily INR, CBC  Linell Shawn K. Velva Harman, PharmD, BCPS, CPP Clinical Pharmacist Pager: [phone] Phone: [phone] 08/10/2016 11:51 AM

## 2016-08-10 NOTE — Procedures (Signed)
    I was present at this dialysis session, have reviewed the session itself and made  appropriate changes [name] MD [place] pager [phone]   08/09/2016, 9:25 PM

## 2016-08-10 NOTE — Progress Notes (Signed)
HD tx completed @ 0008 w/ bp issues at about an hour left in tx, UF goal still obtained, blood rinsed back, report given to Andreas Blower, RN

## 2016-08-10 NOTE — Progress Notes (Addendum)
PROGRESS NOTE [facility]   [name]   LOV:[id number] DOB: 08/03/1955  DOA: 08/09/2016 PCP: System, Pcp Not In   Brief Narrative:  [name] is a 61 y.o. 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 Patient was found to be hypoxic with mild elevated troponin and some EKG changed and was transferred to [facility] for further evaluation. Patient was found to be on pulmonary edema was placed on BiPAP and had emergent  Dialysis.   Subjective: Patient seen and examined. We much better after dialysis last night. Now on 2 L of oxygen nasal cannula. No further complaints.  Assessment & Plan: Acute on chronic respiratory failure with hypoxia - likely due Pulmonary edema ? Contributing factor ACS vs non compliance with diet vs progressive CHF. Initial presentation substernal chest pain, SOB, palpitations and SOB. She was recently discharge 6/23 after being admitted with fluid overload. Had emergency dialysis, now breathing  Cardiology recommendations appreciated, they don't feel that is ACS at this point, although pending echocardiogram and they will decide on further workup. We'll go for dialysis in the morning Wean O2 as tolerated keep O2 saturation above 92%  Acute on chronic combined systolic and diastolic CHF Elevated troponin Hx of CABG, high risk for ACS, cardiology consulted  Echocardiogram on January 2018 EF of 40-45% with grade 3 diastolic dysfunction and general hypokinesis   Troponin flat, EKG with some repolarization abnormalities Echocardiogram pending  Fluid removal with HD   Hyperkalemia Kayexalate 30 g x 1  Insulin 10 units with the amp of dextrose  Will hold on Cal gluconate for now, as telemetry monitor and EKG does not show any changes  Repeat BMP @ 10:00 PM   Essential hypertension May need better  control of hypertension, maybe causing progression of CHF Increase metoprolol Monitor BP  End-stage renal disease HD per nephrology   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 Not on hypoglycemic agents  A1C in May 2018 5.7 Continue to monitor  Chronic anticoagulation due to mechanical aortic valve  On coumadin - Initially INR supratherapeutic 4.1 trending down from 5.3 Will resume Warfarin - target INR 2.5-3.5  Pharmacy consulted   Hx of Bilateral BKA's Phantom pain  Continue gabapentin TID and Tramadol PRN   DVT prophylaxis: Warfarin  Code Status: Partial  Family Communication: None at bedside  Disposition Plan: Home when medically stable   Consultants:   Cardiology   Nephrology   Procedures:   HD 08/09/16  Antimicrobials: Anti-infectives    None       Objective: Vitals:   08/10/16 0015 08/10/16 0054 08/10/16 0228 08/10/16 0428  BP: 118/69 (!) 90/53 122/73 114/69  Pulse: 85  85 79  Resp: (!) 24   16  Temp: 97.3 F (36.3 C)   97.8 F (36.6 C)  TempSrc: Oral   Oral  SpO2: 100%  100% 99%  Weight: 45.7 kg (100 lb 12 oz)     Height:        Intake/Output Summary (Last 24 hours) at 08/10/16 0927 Last data filed at 08/10/16 0600  Gross per 24 hour  Intake                0 ml  Output             3004 ml  Net            -  3004 ml   Filed Weights   08/09/16 1724 08/09/16 2027 08/10/16 0015  Weight: 49.9 kg (110 lb) 48.7 kg (107 lb 5.8 oz) 45.7 kg (100 lb 12 oz)    Examination:  General: Pt is alert, awake, not in acute distress, 2L [place]  Cardiovascular: RRR, S1/S2 + 2/6 SM, no rubs, no gallops Respiratory: Decreased breath sounds bibasilar, no crackles appreciated Abdomen: Soft, nontender nondistended positive bowel sounds Extremities: bilateral BKA, no cyanosis Skin: No rashes or lesions   Data Reviewed: I have personally reviewed following labs and imaging studies  CBC:  Recent Labs Lab 08/09/16 2044  08/10/16 0745  WBC 10.3 8.4  NEUTROABS  --  5.7  HGB 12.4 14.2  HCT 37.5 43.1  MCV 88.9 89.4  PLT 179 301*   Basic Metabolic Panel:  Recent Labs Lab 08/09/16 2044 08/09/16 2251  NA 133* 133*  K 5.7* 5.7*  CL 95* 93*  CO2 27 29  GLUCOSE 158* 153*  BUN 17 18  CREATININE 4.35* 4.33*  CALCIUM 8.8* 8.7*  PHOS  --  6.7*   GFR: Estimated Creatinine Clearance: 9.8 mL/min (A) (by C-G formula based on SCr of 4.33 mg/dL (H)). Liver Function Tests:  Recent Labs Lab 08/09/16 2044 08/09/16 2251  AST 62*  --   ALT 31  --   ALKPHOS 294*  --   BILITOT 1.8*  --   PROT 7.8  --   ALBUMIN 1.8* 1.8*   No results for input(s): LIPASE, AMYLASE in the last 168 hours. No results for input(s): AMMONIA in the last 168 hours. Coagulation Profile:  Recent Labs Lab 08/08/16 08/10/16 0745  INR 5.3* 2.58   Cardiac Enzymes:  Recent Labs Lab 08/09/16 2044 08/10/16 0415  TROPONINI 0.48* 0.47*   BNP (last 3 results) No results for input(s): PROBNP in the last 8760 hours. HbA1C: No results for input(s): HGBA1C in the last 72 hours. CBG:  Recent Labs Lab 08/09/16 1741 08/09/16 1950  GLUCAP 143* 145*   Lipid Profile: No results for input(s): CHOL, HDL, LDLCALC, TRIG, CHOLHDL, LDLDIRECT in the last 72 hours. Thyroid Function Tests: No results for input(s): TSH, T4TOTAL, FREET4, T3FREE, THYROIDAB in the last 72 hours. Anemia Panel: No results for input(s): VITAMINB12, FOLATE, FERRITIN, TIBC, IRON, RETICCTPCT in the last 72 hours. Sepsis Labs: No results for input(s): PROCALCITON, LATICACIDVEN in the last 168 hours.  Recent Results (from the past 240 hour(s))  MRSA PCR Screening     Status: None   Collection Time: 08/09/16  5:39 PM  Result Value Ref Range Status   MRSA by PCR NEGATIVE NEGATIVE Final    Comment:        The GeneXpert MRSA Assay (FDA approved for NASAL specimens only), is one component of a comprehensive MRSA colonization surveillance program. It is  not intended to diagnose MRSA infection nor to guide or monitor treatment for MRSA infections.          Radiology Studies: Portable Chest 1 View  Result Date: 08/09/2016 CLINICAL DATA:  Shortness of breath. EXAM: PORTABLE CHEST 1 VIEW COMPARISON:  August 08, 2016 FINDINGS: Stable PermCath. No pneumothorax. The cardiomediastinal silhouette is unchanged. There is a tiny left effusion with underlying atelectasis. There is a moderate right effusion which is larger in the interval. Probable asymmetric edema, right greater than left. IMPRESSION: Bilateral pleural effusions, right greater than left with probable asymmetric edema, right greater than left. The findings have worsened in the interval. Electronically Signed   By: Dorise Bullion  III M.D   On: 08/09/2016 18:55      Scheduled Meds: . aspirin  81 mg Oral Daily  . [START ON 08/11/2016] calcitRIOL  0.5 mcg Oral Q M,W,F-HD  . calcium acetate  667 mg Oral TID WC  . feeding supplement  1 Container Oral TID BM  . gabapentin  300 mg Oral QHS  . levothyroxine  175 mcg Oral QAC breakfast  . metoCLOPramide  5 mg Oral BID AC  . metoprolol tartrate  12.5 mg Oral BID  . polyethylene glycol  17 g Oral BID  . traMADol  50 mg Oral QID   Continuous Infusions: . sodium chloride    . sodium chloride       LOS: 1 day    Time spent: Total of 25 minutes spent with pt, greater than 50% of which was spent in discussion of  treatment, counseling and coordination of care    Chipper Oman, MD Pager: Text Page via [email or web address]  [phone]  If 7PM-7AM, please contact night-coverage [email or web address] Password [email or web address] 08/10/2016, 9:27 AM

## 2016-08-10 NOTE — Progress Notes (Signed)
[facility] Progress Note  Subjective: breathing better, 3kg off w / HD last night. In HD BP's dropped into the 60's.  BP's stable today.   Vitals:   08/10/16 0228 08/10/16 0428 08/10/16 0817 08/10/16 1138  BP: 122/73 114/69 (!) 146/73 (!) 156/68  Pulse: 85 79 80 80  Resp:  16  18  Temp:  97.8 F (36.6 C) 97.6 F (36.4 C) 98.3 F (36.8 C)  TempSrc:  Oral Oral Oral  SpO2: 100% 99%    Weight:      Height:        Inpatient medications: . aspirin  81 mg Oral Daily  . [START ON 08/11/2016] calcitRIOL  0.5 mcg Oral Q M,W,F-HD  . calcium acetate  667 mg Oral TID WC  . feeding supplement  1 Container Oral TID BM  . gabapentin  300 mg Oral QHS  . levothyroxine  175 mcg Oral QAC breakfast  . metoCLOPramide  5 mg Oral BID AC  . metoprolol tartrate  25 mg Oral BID  . polyethylene glycol  17 g Oral BID  . tacrolimus  2 mg Oral BID  . traMADol  50 mg Oral QID  . warfarin  2.5 mg Oral ONCE-1800  . Warfarin - Pharmacist Dosing Inpatient   Does not apply q1800   . sodium chloride    . sodium chloride     sodium chloride, sodium chloride, diphenhydrAMINE, methocarbamol, morphine injection, nitroGLYCERIN, ondansetron (ZOFRAN) IV, ondansetron  Exam: NAD, alert, nasal O2 No jvd Chest dec'd at bases, o/w clear RRR 2/6 SEM Abd soft ntnd no ascites Ext bilat LE amp, no edema NF, Ox 3 R IJ TDC  Dialysis: [name] MWF 3.5h  R IJ Cath   3K/2.25 bath 48kg  Hep none - Calcitriol 0.5 ug       Assess: 1  Resp distress / pulm edema - due to vol overload, not sure but may still be losing body wt. Was here recently w/ same thing and dry wt was lowered. Doing better after HD last night.  2  [facility] MWF HD.  Had 1st stage BVT done on 07/21/16, seen [facility] office for f/u 6/27 and AVF doing well, plan 2nd stage per DR Bridgett Larsson.  3  Liver transplant - on prograf 2mg  bid 4  CABG '15 / ICM EF 45% / sp [employer] AVR - on coumadin 5  PVD bilat LE amp  Plan - HD Monday , UF 2-2.5 L   [name]  MD [facility] pager [phone]   08/10/2016, 3:06 PM    Recent Labs Lab 08/09/16 2044 08/09/16 2251  NA 133* 133*  K 5.7* 5.7*  CL 95* 93*  CO2 27 29  GLUCOSE 158* 153*  BUN 17 18  CREATININE 4.35* 4.33*  CALCIUM 8.8* 8.7*  PHOS  --  6.7*    Recent Labs Lab 08/09/16 2044 08/09/16 2251  AST 62*  --   ALT 31  --   ALKPHOS 294*  --   BILITOT 1.8*  --   PROT 7.8  --   ALBUMIN 1.8* 1.8*    Recent Labs Lab 08/09/16 2044 08/10/16 0745  WBC 10.3 8.4  NEUTROABS  --  5.7  HGB 12.4 14.2  HCT 37.5 43.1  MCV 88.9 89.4  PLT 179 125*   Iron/TIBC/Ferritin/ %Sat    Component Value Date/Time   IRON 22 (L) 03/12/2016 1308   TIBC 181 (L) 03/12/2016 1308   FERRITIN 1,095 (H) 08/30/2012 1449  IRONPCTSAT 12 03/12/2016 1308

## 2016-08-10 NOTE — Consult Note (Signed)
Cardiology Consultation:   Patient ID: [name]; [id number]; 11/19/1955   Admit date: 08/09/2016 Date of Consult: 08/10/2016  Primary Care Provider: System, Pcp Not In Primary Cardiologist: Dr. Corky Downs  Patient Profile:   [name] is a 61 y.o. female with a hx of ESRD ON HD, liver transplant- for hepatitis C, dobutamine echo suggest severe AS with EF of 45% in 2015, CAD, with CABG and AVR with mechanical aortic valve who is being seen today for the evaluation of CHF at the request of Dr Quincy Simmonds.  History of Present Illness:   [name] has a hx of ESRD-M,W,F on HD, liver transplant for hepatitis C, dobutamine Echo with severe AS  Cardiac catheterization revealed mild CAD with 50% narrowing in moderately severe aortic stenosis with aortic valve area in the range of 0.8-1.2.  She ultimately underwent aortic valve replacement with mechanical aortic valve and single vessel LIMA to LAD CABG revascularization surgery on 03/15/2013 by Dr. [name].  Last cath 12/2013 with single vessel disease focal 70% mid LAD stenosis after the takeoff of the second diagonal vessel; 20% smooth narrowing in a marginal branch of the circumflex vessel; normal dominant right coronary artery and patent LIMA.  Other hx of Bil BKA.   Last echo 2/18 with EF 40-45%, G3 DD,  mild aortic regurg on Echo, Mild to moderate Mitral regurg. LA severely dilated.   Other hx see below.+OSA.  PA pk pressure 53 mm Hg. TR mild to mod regurg.  Pt admitted 08/09/16 with severe resp. Distress,  Thought perhaps to diet non compliance ( had been to picnic and ate hot dog and chips  - + pulmonary edema also with chest pain and palpitations.  She had been discharged with volume overload on 08/02/16.[name]  Underwent urgent  HD on admit and was on BiPap until fluid removed..     ECG  SR with lateral ischemia but similar to EKG 04/17/16  I personally reviewed..   BNP >4,500, troponin 0.48 and 0.47. K+ 5.7 CR elevated.  T bili 1.8, alk phos  294 Na 133.  INR 2.58.  CXR with mod rt pl effusion and small Lt pl. Effusion.    Currently.feels much better.  No further chest pain, when asked she said she hurt all over and she could not breathe.  Prior to the occurrence she had no chest pain or any complaints.  But very ill yesterday.     Past Medical History:  Diagnosis Date  . Anemia    takes Folic Acid daily  . Anxiety   . Aortic stenosis   . Arthritis    "left hand, back" (08/30/2012)  . Asthma   . CAD (coronary artery disease)   . CAD (coronary artery disease) Jan. 2015   Cath: 20% LAD, 50% D1; s/p LIMA-LAD  . Chest pain 03/06/2015  . CHF (congestive heart failure) ([place]) 05/2016  . Chronic back pain   . Chronic bronchitis ([place])    "q yr w/season changes" (08/30/2012)  . Chronic constipation    takes MIralax and Colace daily  . COPD (chronic obstructive pulmonary disease) ([place])   . Depression    takes Cymbalta for "severe" depression  . End stage renal disease on dialysis [facility]) 02/27/2011   Kidneys shut down at time of liver transplant in Sept 2011 at [facility] in [place], she has been on HD ever since.  Dialyzes at [facility] HD on TTS schedule.  Had L forearm graft used 10 months then  removed Dec 2012 due to suspected infection.  A right upper arm AVG was placed Dec 2012 but she developed steal symptoms acutely and it was ligated the same day.  Never had an AV fistula due to small veins.  Now has L thigh AVG put in Jan 2013, has not clotted to date.    [name] GERD (gastroesophageal reflux disease)    takes Omeprazole daily  . Headache    "at least monthly" (08/30/2012)  . Hepatitis C   . History of blood transfusion    "several" (08/30/2012)  . Hypertension    takes Metoprolol and Lisinopril daily, sees Dr Bea Graff  . Hypothyroidism    takes Synthroid daily  . Migraine    "last migraine was in 2013" (08/30/2012)  . Neuromuscular disorder ([place])    carpal tunnel in right hand  . Obesity   . Peripheral vascular disease (HCC) hands  and legs  . Pneumonia    "today and several times before" (08/30/2012)  . 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 S/P unilateral BKA (below knee amputation), left ([place]) 04/30/2016  . SVT (supraventricular tachycardia) ([place]) 06/09/14  . Tobacco abuse   . Type II diabetes mellitus (HCC)    Levemir 2units daily if > 150    Past Surgical History:  Procedure Laterality Date  . ABDOMINAL AORTOGRAM W/LOWER EXTREMITY N/A 07/04/2016   Procedure: Abdominal Aortogram w/Lower Extremity;  Surgeon: Elam Dutch, MD;  Location: [place] CV LAB;  Service: Cardiovascular;  Laterality: N/A;  . AMPUTATION Left 04/24/2016   Procedure: AMPUTATION BELOW KNEE;  Surgeon: Angelia Mould, MD;  Location: [place];  Service: Vascular;  Laterality: Left;  . AMPUTATION Right 07/08/2016   Procedure: AMPUTATION BELOW KNEE;  Surgeon: Angelia Mould, MD;  Location: [place];  Service: Vascular;  Laterality: Right;  . AORTIC VALVE REPLACEMENT N/A 03/15/2013   AVR; Surgeon: Ivin Poot, MD;  Location[facility] OR; Open Heart Surgery;  14m[employer] mechanical prosthesis, top hat valve  . ARTERIOVENOUS GRAFT PLACEMENT Left 10/03/10    forearm  . AV FISTULA PLACEMENT  01/29/2011   Procedure: INSERTION OF ARTERIOVENOUS (AV) GORE-TEX GRAFT ARM;  Surgeon: CElam Dutch MD;  [facility];  Service: Vascular;  Laterality: Right;  . AV FISTULA PLACEMENT  03/10/2011   Procedure: INSERTION OF ARTERIOVENOUS (AV) GORE-TEX GRAFT THIGH;  Surgeon: CElam Dutch MD;  [place]  Service: Vascular;  Laterality: Left;  . AV FISTULA PLACEMENT Left 07/21/2016   Procedure: CREATION OF LEFT UPPER ARM ARTERIOVENOUS (AV) FISTULA;  Surgeon: CConrad Braxton MD;  [place]  Service: Vascular;  Laterality: Left;  . AWampumREMOVAL  12/23/2010   Procedure: REMOVAL OF ARTERIOVENOUS GORETEX  GRAFT ([place];  Surgeon: CElam Dutch MD;  [place]  Service: Vascular;  Laterality: Left;  procedure started _0 -1852  . CHOLECYSTECTOMY  1993  . CORONARY ARTERY BYPASS GRAFT N/A 03/15/2013   Procedure: CORONARY ARTERY BYPASS GRAFTING (CABG) times one using left internal mammary artery.;  Surgeon: PIvin Poot MD;  [place]  Service: Open Heart Surgery;  Laterality: N/A;  POSS CABG X 1  . CYSTOSCOPY  1990's  . INSERTION OF DIALYSIS CATHETER  12/23/2010   Procedure: INSERTION OF DIALYSIS CATHETER;  Surgeon: CElam Dutch MD;  [place]  Service: Vascular;  Laterality: Right;  Right Internal Jugular 28cm dialysis catheter insertion procedure time 1701-1720   . INSERTION OF DIALYSIS CATHETER Right 03/11/2016   Procedure: INSERTION OF DIALYSIS CATHETER;  Surgeon: Angelia Mould, MD;  Location: [place];  Service: Vascular;  Laterality: Right;  . INTRAOPERATIVE TRANSESOPHAGEAL ECHOCARDIOGRAM N/A 03/15/2013   Procedure: INTRAOPERATIVE TRANSESOPHAGEAL ECHOCARDIOGRAM;  Surgeon: Ivin Poot, MD;  Location: [place];  Service: Open Heart Surgery;  Laterality: N/A;  . LEFT HEART CATHETERIZATION WITH CORONARY ANGIOGRAM N/A 07/29/2012   Procedure: LEFT HEART CATHETERIZATION WITH CORONARY ANGIOGRAM;  Surgeon: Troy Sine, MD;  Location: [facility] CATH LAB;  Service: Cardiovascular;  Laterality: N/A;  . LEFT HEART CATHETERIZATION WITH CORONARY ANGIOGRAM N/A 03/10/2013   Procedure: LEFT HEART CATHETERIZATION WITH CORONARY ANGIOGRAM;  Surgeon: Troy Sine, MD;  Location: [facility] CATH LAB;  Service: Cardiovascular;  Laterality: N/A;  . LEFT HEART CATHETERIZATION WITH CORONARY/GRAFT ANGIOGRAM N/A 12/24/2011   Procedure: LEFT HEART CATHETERIZATION WITH Beatrix Fetters;  Surgeon: [name], MD;  Location: [facility] CATH LAB;  Service: Cardiovascular;  Laterality: N/A;  . LEFT HEART CATHETERIZATION WITH CORONARY/GRAFT ANGIOGRAM N/A 12/16/2013   Procedure: LEFT HEART CATHETERIZATION  WITH Beatrix Fetters;  Surgeon: Troy Sine, MD;  Location: [facility] CATH LAB;  Service: Cardiovascular;  Laterality: N/A;  . LIGATION ARTERIOVENOUS GORTEX GRAFT Left 03/11/2016   Procedure: LIGATION THIGH ARTERIOVENOUS GORTEX GRAFT;  Surgeon: Angelia Mould, MD;  Location: [place];  Service: Vascular;  Laterality: Left;  . LIVER TRANSPLANT  10/25/2009   sees Dr Ferol Luz 1 every 6 months, saw last in Dec 2013. [name] Coord [phone]  . PERIPHERAL VASCULAR CATHETERIZATION N/A 11/06/2015   Procedure: Abdominal Aortogram;  Surgeon: Serafina Mitchell, MD;  Location: [place] CV LAB;  Service: Cardiovascular;  Laterality: N/A;  . PERIPHERAL VASCULAR CATHETERIZATION N/A 11/06/2015   Procedure: Lower Extremity Angiography;  Surgeon: Serafina Mitchell, MD;  Location: [place] CV LAB;  Service: Cardiovascular;  Laterality: N/A;  . PERIPHERAL VASCULAR CATHETERIZATION  11/06/2015   Procedure: Peripheral Vascular Atherectomy;  Surgeon: Serafina Mitchell, MD;  Location: [place] CV LAB;  Service: Cardiovascular;;  Left Superficial femoral  . SHUNTOGRAM Left 05/15/2014   Procedure: SHUNTOGRAM;  Surgeon: Conrad Brodhead, MD;  Location: [facility] C[facility] LAB;  Service: Cardiovascular;  Laterality: Left;  . SMALL INTESTINE SURGERY  90's  . SPINAL GROWTH RODS  2010   "put 2 metal rods in my back; they had detetriorated" (08/30/2012)  . THROMBECTOMY    . THROMBECTOMY AND REVISION OF ARTERIOVENTOUS (AV) GORETEX  GRAFT Left 03/30/2014  . THROMBECTOMY AND REVISION OF ARTERIOVENTOUS (AV) GORETEX  GRAFT Left 03/30/2014   Procedure: THROMBECTOMY AND REVISION OF ARTERIOVENTOUS (AV) GORETEX  GRAFT;  Surgeon: Conrad Rosebud, MD;  Location: [place];  Service: Vascular;  Laterality: Left;  . TUBAL LIGATION  1990's     Inpatient Medications: Scheduled Meds: . aspirin  81 mg Oral Daily  . [START ON 08/11/2016] calcitRIOL  0.5 mcg Oral Q M,W,F-HD  . calcium acetate  667 mg Oral TID WC  . feeding supplement  1 Container Oral  TID BM  . gabapentin  300 mg Oral QHS  . levothyroxine  175 mcg Oral QAC breakfast  . metoCLOPramide  5 mg Oral BID AC  . metoprolol tartrate  12.5 mg Oral BID  . polyethylene glycol  17 g Oral BID  . traMADol  50 mg Oral QID   Continuous Infusions: . sodium chloride    .  sodium chloride     PRN Meds: sodium chloride, sodium chloride, alteplase, diphenhydrAMINE, heparin, lidocaine (PF), lidocaine-prilocaine, methocarbamol, morphine injection, nitroGLYCERIN, pentafluoroprop-tetrafluoroeth  Allergies:    Allergies  Allergen Reactions  . Acetaminophen Other (See Comments)    Liver transplant recipient   . Codeine Itching  . Mirtazapine Other (See Comments)    hallucination    Social History:   Social History   Social History  . Marital status: Divorced    Spouse name: N/A  . Number of children: N/A  . Years of education: N/A   Occupational History  . Not on file.   Social History Main Topics  . Smoking status: Current Every Day Smoker    Packs/day: 0.50    Years: 40.00    Types: Cigarettes  . Smokeless tobacco: Never Used  . Alcohol use No  . Drug use: No  . Sexual activity: No   Other Topics Concern  . Not on file   Social History Narrative  . No narrative on file    Family History:   The patient's family history includes Cancer in her father and mother; Diabetes in her mother; Hypertension in her mother; Stroke in her mother. There is no history of Anesthesia problems, Hypotension, Malignant hyperthermia, or Pseudochol deficiency.  ROS:  Please see the history of present illness.  ROS  General:no colds or fevers, no weight changes Skin:no rashes or ulcers HEENT:no blurred vision, no congestion CV:see HPI PUL:see HPI GI:no diarrhea constipation or melena, no indigestion- liver is followed at [place] OV:FIEP not void MS:no joint pain, no claudication, bil BKA Neuro:no syncope, no lightheadedness Endo:+ diabetes, + thyroid disease    Physical  Exam/Data:   Vitals:   08/10/16 0015 08/10/16 0054 08/10/16 0228 08/10/16 0428  BP: 118/69 (!) 90/53 122/73 114/69  Pulse: 85  85 79  Resp: (!) 24   16  Temp: 97.3 F (36.3 C)   97.8 F (36.6 C)  TempSrc: Oral   Oral  SpO2: 100%  100% 99%  Weight: 100 lb 12 oz (45.7 kg)     Height:        Intake/Output Summary (Last 24 hours) at 08/10/16 0927 Last data filed at 08/10/16 0600  Gross per 24 hour  Intake                0 ml  Output             3004 ml  Net            -3004 ml   Filed Weights   08/09/16 1724 08/09/16 2027 08/10/16 0015  Weight: 110 lb (49.9 kg) 107 lb 5.8 oz (48.7 kg) 100 lb 12 oz (45.7 kg)   Body mass index is 17.29 kg/m.  General:  Thin female in no acute distress HEENT: normal Lymph: no adenopathy Neck: no JVD Endocrine:  No thryomegaly Vascular: No carotid bruits;  Cardiac:  normal S1, S2; RRR; no murmur + click of mechanical valve Lungs:  breath sounds to auscultation bilaterally, no wheezing, + rhonchi no rales  Abd: soft, nontender, no hepatomegaly  Ext: no edema of Bil. Stumps. Musculoskeletal:  No deformities, BUE  strength normal and equal Skin: warm and dry  Neuro:  Alert and oriented X 3 MAE, follows commands + facial symmetry.  Psych:  Normal affect   EKG:  The EKG was personally reviewed and demonstrates:  See above Telemetry:  Telemetry was personally reviewed and demonstrates:  SR  Relevant CV Studies: PRIOR  TO ADMIT  ECHO 02/2016 Study Conclusions  - Left ventricle: The cavity size was normal. There was moderate   concentric hypertrophy. Systolic function was mildly reduced. The   estimated ejection fraction was in the range of 40% to 45%. Mild   hypokinesis of the anteroseptal myocardium. Doppler parameters   are consistent with a reversible restrictive pattern, indicative   of decreased left ventricular diastolic compliance and/or   increased left atrial pressure (grade 3 diastolic dysfunction). - Aortic valve: A [employer]. [employer]  Medical mechanical prosthesis was   present. There was mild regurgitation. - Mitral valve: There was mild to moderate regurgitation. - Left atrium: The atrium was moderately dilated. - Tricuspid valve: There was mild-moderate regurgitation. - Pulmonary arteries: Systolic pressure was moderately increased.   PA peak pressure: 52 mm Hg (S).  Laboratory Data:  Chemistry Recent Labs Lab 08/09/16 2044 08/09/16 2251  NA 133* 133*  K 5.7* 5.7*  CL 95* 93*  CO2 27 29  GLUCOSE 158* 153*  BUN 17 18  CREATININE 4.35* 4.33*  CALCIUM 8.8* 8.7*  GFRNONAA 10* 10*  GFRAA 12* 12*  ANIONGAP 11 11     Recent Labs Lab 08/09/16 2044 08/09/16 2251  PROT 7.8  --   ALBUMIN 1.8* 1.8*  AST 62*  --   ALT 31  --   ALKPHOS 294*  --   BILITOT 1.8*  --    Hematology Recent Labs Lab 08/09/16 2044 08/10/16 0745  WBC 10.3 8.4  RBC 4.22 4.82  HGB 12.4 14.2  HCT 37.5 43.1  MCV 88.9 89.4  MCH 29.4 29.5  MCHC 33.1 32.9  RDW 16.8* 17.0*  PLT 179 125*   Cardiac Enzymes Recent Labs Lab 08/09/16 2044 08/10/16 0415  TROPONINI 0.48* 0.47*   No results for input(s): TROPIPOC in the last 168 hours.  BNP Recent Labs Lab 08/09/16 1930  BNP >4,500.0*    DDimer No results for input(s): DDIMER in the last 168 hours.  Radiology/Studies:  Portable Chest 1 View  Result Date: 08/09/2016 CLINICAL DATA:  Shortness of breath. EXAM: PORTABLE CHEST 1 VIEW COMPARISON:  August 08, 2016 FINDINGS: Stable PermCath. No pneumothorax. The cardiomediastinal silhouette is unchanged. There is a tiny left effusion with underlying atelectasis. There is a moderate right effusion which is larger in the interval. Probable asymmetric edema, right greater than left. IMPRESSION: Bilateral pleural effusions, right greater than left with probable asymmetric edema, right greater than left. The findings have worsened in the interval. Electronically Signed   By: Dorise Bullion III M.D   On: 08/09/2016 18:55    Assessment and  Plan:   1. Acute pulmonary edema with respiratory failure requiring BiPAP and emergent HD.  Now without SOB.  She does have G3DD on Echo from Jan.  With salt intake yesterday she may have developed pul. Edema.   Dr. Debara Pickett to see  2. Chest pain with elevated troponin but minimal and not unexpected with acute pul. Edema.  Last cath in 2015 and patent LIMA. And other vessels with minimal disease.  Elevation due to demand ischemia.  3. Diastolic HF mild systolic with G3 DD - EF 54-09%   4.  AVR with mechanical valve only mild regurg on echo in Jan.    5. Liver transplant, now with elevated bilirubin followed by IM and Gaspar Cola was to have biopsy soon.    6. Bilateral BKA, for PAD.    7. ESRD on HS MW,F.  8.  DM per IM  Signed, Cecilie Kicks, NP  08/10/2016 9:27 AM   Attending note:  Patient seen and examined. Reviewed records and discussed the case with Ms. Ingold NP. Ms. Hammad presents with fairly acute shortness of breath, recent salt indiscretion, found to have pulmonary edema - improved with BIPAP and urgent hemodialysis. Had admission recently for fluid overload as well. She states that she has been compliant with her medications. Today feels much better, no dyspnea or chest pain.  On examination she appears comfortable, SBP 130-150 range, HR 70-80 in sinus rhythm. Lungs exhibit decreased breath sounds at the bases, cardiac exam with RRR and 2/6 systolic murmur. Status post bilateral BKA. Lab work reveals creatinine 4.3, potassium 5.7, peak troponin I 0.48, BNP greater than 4500, hemoglobin 14.2. I personally reviewed her tracing from 08/10/2016 which shows sinus rhythm with IVCD and repolarization abnormalities. Chest x-ray shows bilateral pleural effusions.  Patient presents with evidence of pulmonary edema and pleural effusions, acute on chronic combined heart failure. Last echocardiogram back in February revealed LVEF 40-45% with grade 3 diastolic dysfunction. Mechanical aortic  prosthesis stable at that point. Mild, flat elevation in troponin I not overly suggestive of ACS, suspect related to heart failure in the setting of renal disease. Patient's volume status management is via hemodialysis as she does not make urine and is not on a diuretic. She may need to have more fluid pulled off with hemodialysis sessions at least in the short-term. We will arrange an echocardiogram to follow up on cardiac structure and function including status of aortic prosthesis. Not clear that further ischemic testing is necessary as yet.  Satira Sark, M.D., F.A.C.C.

## 2016-08-10 NOTE — Progress Notes (Signed)
Lab was unable to draw 2nd troponin. Pt currently resting comfortably. Will continue to monitor.

## 2016-08-10 NOTE — Progress Notes (Signed)
Pt and family member stated that she is a complete DNR, Pt also had episode of symptomatic hypoglycemia 63. Administered juice X2, repeat BG 70. Pt now feels better. Notified MD to address. Orders placed. Will continue to monitor.

## 2016-08-11 ENCOUNTER — Inpatient Hospital Stay (HOSPITAL_COMMUNITY): Payer: Medicare Other | Admitting: Anesthesiology

## 2016-08-11 ENCOUNTER — Inpatient Hospital Stay (HOSPITAL_COMMUNITY): Payer: Medicare Other

## 2016-08-11 ENCOUNTER — Encounter (HOSPITAL_COMMUNITY)
Admission: AD | Disposition: A | Discharge status: Home / Self Care | Payer: Self-pay | Source: Other Acute Inpatient Hospital | Attending: Family Medicine

## 2016-08-11 DIAGNOSIS — I340 Nonrheumatic mitral (valve) insufficiency: Secondary | ICD-10-CM

## 2016-08-11 DIAGNOSIS — I5043 Acute on chronic combined systolic (congestive) and diastolic (congestive) heart failure: Secondary | ICD-10-CM

## 2016-08-11 DIAGNOSIS — T82898A Other specified complication of vascular prosthetic devices, implants and grafts, initial encounter: Secondary | ICD-10-CM

## 2016-08-11 DIAGNOSIS — T82898D Other specified complication of vascular prosthetic devices, implants and grafts, subsequent encounter: Secondary | ICD-10-CM

## 2016-08-11 HISTORY — PX: LIGATION OF ARTERIOVENOUS  FISTULA: SHX5948

## 2016-08-11 LAB — CBC
HCT: 37.4 % (ref 36.0–46.0)
Hemoglobin: 12.5 g/dL (ref 12.0–15.0)
MCH: 29.8 pg (ref 26.0–34.0)
MCHC: 33.4 g/dL (ref 30.0–36.0)
MCV: 89.3 fL (ref 78.0–100.0)
PLATELETS: 168 10*3/uL (ref 150–400)
RBC: 4.19 MIL/uL (ref 3.87–5.11)
RDW: 16.7 % — ABNORMAL HIGH (ref 11.5–15.5)
WBC: 7.5 10*3/uL (ref 4.0–10.5)

## 2016-08-11 LAB — ECHOCARDIOGRAM COMPLETE
Height: 64 in
Weight: 1661.39 oz

## 2016-08-11 LAB — GLUCOSE, CAPILLARY
GLUCOSE-CAPILLARY: 87 mg/dL (ref 65–99)
GLUCOSE-CAPILLARY: 78 mg/dL (ref 65–99)
GLUCOSE-CAPILLARY: 100 mg/dL — AB (ref 65–99)
Glucose-Capillary: 44 mg/dL — CL (ref 65–99)
Glucose-Capillary: 81 mg/dL (ref 65–99)

## 2016-08-11 LAB — PROTIME-INR
INR: 2.55
PROTHROMBIN TIME: 27.9 s — AB (ref 11.4–15.2)

## 2016-08-11 LAB — BASIC METABOLIC PANEL
Anion gap: 10 (ref 5–15)
BUN: 20 mg/dL (ref 6–20)
CO2: 26 mmol/L (ref 22–32)
Calcium: 8.5 mg/dL — ABNORMAL LOW (ref 8.9–10.3)
Chloride: 96 mmol/L — ABNORMAL LOW (ref 101–111)
Creatinine, Ser: 3.89 mg/dL — ABNORMAL HIGH (ref 0.44–1.00)
GFR calc Af Amer: 13 mL/min — ABNORMAL LOW (ref >60)
GFR, EST NON AFRICAN AMERICAN: 12 mL/min — AB (ref >60)
Glucose, Bld: 109 mg/dL — ABNORMAL HIGH (ref 65–99)
POTASSIUM: 4.1 mmol/L (ref 3.5–5.1)
SODIUM: 132 mmol/L — AB (ref 135–145)

## 2016-08-11 SURGERY — LIGATION OF ARTERIOVENOUS  FISTULA
Anesthesia: Monitor Anesthesia Care | Site: Arm Upper | Laterality: Left

## 2016-08-11 MED ORDER — LIDOCAINE HCL (PF) 1 % IJ SOLN
INTRAMUSCULAR | Status: AC
  Filled 2016-08-11: qty 30

## 2016-08-11 MED ORDER — CEFAZOLIN SODIUM 1 G IJ SOLR
INTRAMUSCULAR | Status: DC | PRN
  Administered 2016-08-11: 1 g via INTRAMUSCULAR

## 2016-08-11 MED ORDER — 0.9 % SODIUM CHLORIDE (POUR BTL) OPTIME
TOPICAL | Status: DC | PRN
  Administered 2016-08-11: 1000 mL

## 2016-08-11 MED ORDER — WARFARIN SODIUM 2.5 MG PO TABS: 1.2500 mg | ORAL_TABLET | Freq: Once | ORAL | Status: DC

## 2016-08-11 MED ORDER — LIDOCAINE HCL (PF) 1 % IJ SOLN
INTRAMUSCULAR | Status: DC | PRN
  Administered 2016-08-11: 8 mL

## 2016-08-11 MED ORDER — ROCURONIUM BROMIDE 10 MG/ML (PF) SYRINGE
PREFILLED_SYRINGE | INTRAVENOUS | Status: AC
  Filled 2016-08-11: qty 5

## 2016-08-11 MED ORDER — WARFARIN - PHARMACIST DOSING INPATIENT: Freq: Every day | Status: DC

## 2016-08-11 MED ORDER — HEPARIN SODIUM (PORCINE) 1000 UNIT/ML DIALYSIS: 1000.0000 [IU] | INTRAMUSCULAR | Status: DC | PRN

## 2016-08-11 MED ORDER — ONDANSETRON HCL 4 MG/2ML IJ SOLN
INTRAMUSCULAR | Status: DC | PRN
  Administered 2016-08-11: 4 mg via INTRAVENOUS

## 2016-08-11 MED ORDER — MORPHINE SULFATE (PF) 4 MG/ML IV SOLN
INTRAVENOUS | Status: AC
  Filled 2016-08-11: qty 1

## 2016-08-11 MED ORDER — SODIUM CHLORIDE 0.9 % IV SOLN: 100.0000 mL | INTRAVENOUS | Status: DC | PRN

## 2016-08-11 MED ORDER — MIDAZOLAM HCL 2 MG/2ML IJ SOLN
INTRAMUSCULAR | Status: AC
  Filled 2016-08-11: qty 2

## 2016-08-11 MED ORDER — LIDOCAINE-PRILOCAINE 2.5-2.5 % EX CREA: 1.0000 "application " | TOPICAL_CREAM | CUTANEOUS | Status: DC | PRN

## 2016-08-11 MED ORDER — SODIUM CHLORIDE 0.9 % IV SOLN
INTRAVENOUS | Status: DC
  Administered 2016-08-11: 16:00:00 via INTRAVENOUS

## 2016-08-11 MED ORDER — FENTANYL CITRATE (PF) 250 MCG/5ML IJ SOLN
INTRAMUSCULAR | Status: AC
  Filled 2016-08-11: qty 5

## 2016-08-11 MED ORDER — SODIUM CHLORIDE 0.9 % IV SOLN
Freq: Once | INTRAVENOUS | Status: AC
  Administered 2016-08-11: 14:00:00 via INTRAVENOUS

## 2016-08-11 MED ORDER — ALTEPLASE 2 MG IJ SOLR: 2.0000 mg | Freq: Once | INTRAMUSCULAR | Status: DC | PRN

## 2016-08-11 MED ORDER — DEXTROSE 50 % IV SOLN
1.0000 | Freq: Once | INTRAVENOUS | Status: AC
  Administered 2016-08-11: 50 mL via INTRAVENOUS

## 2016-08-11 MED ORDER — FENTANYL CITRATE (PF) 100 MCG/2ML IJ SOLN
INTRAMUSCULAR | Status: DC | PRN
  Administered 2016-08-11: 50 ug via INTRAVENOUS

## 2016-08-11 MED ORDER — EPHEDRINE 5 MG/ML INJ
INTRAVENOUS | Status: AC
  Filled 2016-08-11: qty 20

## 2016-08-11 MED ORDER — MIDAZOLAM HCL 5 MG/5ML IJ SOLN
INTRAMUSCULAR | Status: DC | PRN
  Administered 2016-08-11 (×2): 1 mg via INTRAVENOUS

## 2016-08-11 MED ORDER — PHENYLEPHRINE 40 MCG/ML (10ML) SYRINGE FOR IV PUSH (FOR BLOOD PRESSURE SUPPORT)
PREFILLED_SYRINGE | INTRAVENOUS | Status: AC
  Filled 2016-08-11: qty 40

## 2016-08-11 MED ORDER — METOPROLOL TARTRATE 12.5 MG HALF TABLET
12.5000 mg | ORAL_TABLET | Freq: Two times a day (BID) | ORAL | Status: DC
  Administered 2016-08-12: 12.5 mg via ORAL
  Filled 2016-08-11 (×2): qty 1

## 2016-08-11 MED ORDER — CALCITRIOL 0.5 MCG PO CAPS
ORAL_CAPSULE | ORAL | Status: AC
  Filled 2016-08-11: qty 1

## 2016-08-11 MED ORDER — PROMETHAZINE HCL 25 MG/ML IJ SOLN: 6.2500 mg | INTRAMUSCULAR | Status: DC | PRN

## 2016-08-11 MED ORDER — EPHEDRINE SULFATE 50 MG/ML IJ SOLN
INTRAMUSCULAR | Status: DC | PRN
  Administered 2016-08-11: 5 mg via INTRAVENOUS

## 2016-08-11 MED ORDER — LIDOCAINE HCL (PF) 1 % IJ SOLN: 5.0000 mL | INTRAMUSCULAR | Status: DC | PRN

## 2016-08-11 MED ORDER — PROPOFOL 500 MG/50ML IV EMUL
INTRAVENOUS | Status: DC | PRN
  Administered 2016-08-11: 50 ug/kg/min via INTRAVENOUS

## 2016-08-11 MED ORDER — PHENYLEPHRINE HCL 10 MG/ML IJ SOLN
INTRAMUSCULAR | Status: DC | PRN
  Administered 2016-08-11 (×2): 80 ug via INTRAVENOUS

## 2016-08-11 MED ORDER — PENTAFLUOROPROP-TETRAFLUOROETH EX AERO: 1.0000 "application " | INHALATION_SPRAY | CUTANEOUS | Status: DC | PRN

## 2016-08-11 MED ORDER — FENTANYL CITRATE (PF) 100 MCG/2ML IJ SOLN: 25.0000 ug | INTRAMUSCULAR | Status: DC | PRN

## 2016-08-11 MED ORDER — TRAMADOL HCL 50 MG PO TABS
ORAL_TABLET | ORAL | Status: AC
  Filled 2016-08-11: qty 1

## 2016-08-11 MED ORDER — DEXTROSE 50 % IV SOLN
INTRAVENOUS | Status: AC
  Filled 2016-08-11: qty 50

## 2016-08-11 SURGICAL SUPPLY — 22 items
CANISTER SUCT 3000ML PPV (MISCELLANEOUS) ×2 IMPLANT
DERMABOND ADVANCED (GAUZE/BANDAGES/DRESSINGS) ×1
DERMABOND ADVANCED .7 DNX12 (GAUZE/BANDAGES/DRESSINGS) ×1 IMPLANT
ELECT REM PT RETURN 9FT ADLT (ELECTROSURGICAL) ×2
ELECTRODE REM PT RTRN 9FT ADLT (ELECTROSURGICAL) ×1 IMPLANT
GLOVE BIO SURGEON STRL SZ7.5 (GLOVE) ×2 IMPLANT
GOWN STRL REUS W/ TWL LRG LVL3 (GOWN DISPOSABLE) ×3 IMPLANT
GOWN STRL REUS W/TWL LRG LVL3 (GOWN DISPOSABLE) ×3
HEMOSTAT SPONGE AVITENE ULTRA (HEMOSTASIS) IMPLANT
KIT BASIN OR (CUSTOM PROCEDURE TRAY) ×2 IMPLANT
KIT ROOM TURNOVER OR (KITS) ×2 IMPLANT
LOOP VESSEL MINI RED (MISCELLANEOUS) IMPLANT
NS IRRIG 1000ML POUR BTL (IV SOLUTION) ×2 IMPLANT
PACK CV ACCESS (CUSTOM PROCEDURE TRAY) ×2 IMPLANT
PAD ARMBOARD 7.5X6 YLW CONV (MISCELLANEOUS) ×4 IMPLANT
SUT PROLENE 6 0 CC (SUTURE) IMPLANT
SUT SILK 0 (SUTURE) IMPLANT
SUT VIC AB 3-0 SH 27 (SUTURE) ×1
SUT VIC AB 3-0 SH 27X BRD (SUTURE) ×1 IMPLANT
SUT VICRYL 4-0 PS2 18IN ABS (SUTURE) ×2 IMPLANT
UNDERPAD 30X30 (UNDERPADS AND DIAPERS) ×2 IMPLANT
WATER STERILE IRR 1000ML POUR (IV SOLUTION) ×2 IMPLANT

## 2016-08-11 NOTE — Anesthesia Postprocedure Evaluation (Signed)
Anesthesia Post Note  Patient: [name]  Procedure(s) Performed: Procedure(s) (LRB): LIGATION OF ARTERIOVENOUS  FISTULA (Left)     Patient location during evaluation: PACU Anesthesia Type: MAC Level of consciousness: awake and alert Pain management: pain level controlled Vital Signs Assessment: post-procedure vital signs reviewed and stable Respiratory status: spontaneous breathing, nonlabored ventilation, respiratory function stable and patient connected to nasal cannula oxygen Cardiovascular status: stable and blood pressure returned to baseline Anesthetic complications: no    Last Vitals:  Vitals:   08/11/16 1514 08/11/16 1730  BP: 124/66 119/62  Pulse: 66 63  Resp: 16 12  Temp: 36.6 C     Last Pain:  Vitals:   08/11/16 1514  TempSrc: Oral  PainSc:                  Oasis Goehring S

## 2016-08-11 NOTE — Progress Notes (Signed)
[facility] Progress Note  Subjective: Seen on HD today- crying - stump hurts and also c/o steal sxms in left hand   Vitals:   08/11/16 0730 08/11/16 0800 08/11/16 0830 08/11/16 0900  BP: (!) 114/51 127/64 (!) 108/56 (!) 96/57  Pulse: 64 67 71 68  Resp:      Temp:      TempSrc:      SpO2:      Weight:      Height:        Inpatient medications: . aspirin  81 mg Oral Daily  . calcitRIOL  0.5 mcg Oral Q M,W,F-HD  . calcium acetate  667 mg Oral TID WC  . feeding supplement  1 Container Oral TID BM  . gabapentin  300 mg Oral QHS  . levothyroxine  175 mcg Oral QAC breakfast  . metoCLOPramide  5 mg Oral BID AC  . metoprolol tartrate  12.5 mg Oral BID  . polyethylene glycol  17 g Oral BID  . tacrolimus  2 mg Oral BID  . traMADol  50 mg Oral QID  . Warfarin - Pharmacist Dosing Inpatient   Does not apply q1800   . sodium chloride    . sodium chloride    . sodium chloride    . sodium chloride    . dextrose 25 mL/hr at 08/10/16 2349   sodium chloride, sodium chloride, sodium chloride, sodium chloride, alteplase, diphenhydrAMINE, heparin, lidocaine (PF), lidocaine-prilocaine, methocarbamol, morphine injection, nitroGLYCERIN, ondansetron (ZOFRAN) IV, ondansetron, pentafluoroprop-tetrafluoroeth  Exam: NAD, alert, nasal O2 No jvd Chest dec'd at bases, o/w clear RRR 2/6 SEM Abd soft ntnd no ascites Ext bilat LE amp, no edema NF, Ox 3 R IJ TDC  Dialysis: [name] MWF 3.5h  R IJ Cath   3K/2.25 bath 48kg  Hep none - Calcitriol 0.5 ug       Assess: 1  Resp distress / pulm edema - due to vol overload, not sure but may still be losing body wt. Was here recently w/ same thing and dry wt was lowered. Doing better after emergent HD Sat night- on HD again today on schedule.  I think is such a small lady gets into trouble with relatively low volume on  2  ESRD MWF HD- doing today.  Had 1st stage BVT done on 07/21/16, seen [facility] office for f/u 6/27 and AVF doing well, plan 2nd  stage per DR Bridgett Larsson.  Now c/o possible steal sxms- hand slightly cool- will have [facility] to eval while here  3  Liver transplant - on prograf 2mg  bid 4  CABG '15 / ICM EF 45% / sp St Jude AVR - on coumadin 5  PVD bilat LE amp 6. Bones- phoslo and calcitriol  7. Anemia- hgb over 12- no ESA needed at this time  Deshanta Lady A  08/11/2016, 9:23 AM    Recent Labs Lab 08/09/16 2251 08/10/16 1459 08/11/16 0026  NA 133* 129* 132*  K 5.7* 6.5* 4.1  CL 93* 92* 96*  CO2 29 25 26   GLUCOSE 153* 155* 109*  BUN 18 14 20   CREATININE 4.33* 3.32* 3.89*  CALCIUM 8.7* 9.1 8.5*  PHOS 6.7*  --   --     Recent Labs Lab 08/09/16 2044 08/09/16 2251  AST 62*  --   ALT 31  --   ALKPHOS 294*  --   BILITOT 1.8*  --   PROT 7.8  --   ALBUMIN 1.8* 1.8*    Recent Labs Lab 08/09/16 2044  08/10/16 0745 08/11/16 0026  WBC 10.3 8.4 7.5  NEUTROABS  --  5.7  --   HGB 12.4 14.2 12.5  HCT 37.5 43.1 37.4  MCV 88.9 89.4 89.3  PLT 179 125* 168   Iron/TIBC/Ferritin/ %Sat    Component Value Date/Time   IRON 22 (L) 03/12/2016 1308   TIBC 181 (L) 03/12/2016 1308   FERRITIN 1,095 (H) 08/30/2012 1449   IRONPCTSAT 12 03/12/2016 1308

## 2016-08-11 NOTE — Transfer of Care (Signed)
Immediate Anesthesia Transfer of Care Note  Patient: [name]  Procedure(s) Performed: Procedure(s): LIGATION OF ARTERIOVENOUS  FISTULA (Left)  Patient Location: PACU  Anesthesia Type:MAC  Level of Consciousness: awake, alert , oriented and patient cooperative  Airway & Oxygen Therapy: Patient Spontanous Breathing and Patient connected to face mask oxygen  Post-op Assessment: Report given to RN and Post -op Vital signs reviewed and stable  Post vital signs: Reviewed and stable  Last Vitals:  Vitals:   08/11/16 1430 08/11/16 1514  BP:  124/66  Pulse: 65 66  Resp:  16  Temp:  36.6 C    Last Pain:  Vitals:   08/11/16 1514  TempSrc: Oral  PainSc:       Patients Stated Pain Goal: 0 (91/91/66 0600)  Complications: No apparent anesthesia complications

## 2016-08-11 NOTE — Progress Notes (Signed)
Patient returned to room from hemodialysis and transport in room to take her to Echo

## 2016-08-11 NOTE — Anesthesia Preprocedure Evaluation (Addendum)
Anesthesia Evaluation  Patient identified by MRN, date of birth, ID band Patient awake    Reviewed: Allergy & Precautions, NPO status , Patient's Chart, lab work & pertinent test results  Airway Mallampati: II  TM Distance: >3 FB Neck ROM: Full    Dental no notable dental hx.    Pulmonary COPD, Current Smoker,    Pulmonary exam normal breath sounds clear to auscultation       Cardiovascular hypertension, Pt. on medications and Pt. on home beta blockers + Peripheral Vascular Disease  + Valvular Problems/Murmurs MR  Rhythm:Regular Rate:Normal + Systolic murmurs S/P aortic valve replacement 03/15/13   Left ventricle: The cavity size was normal. There was moderate   concentric hypertrophy. Systolic function was mildly reduced. The   estimated ejection fraction was in the range of 40% to 45%. Mild   hypokinesis of the anteroseptal myocardium. Doppler parameters   are consistent with a reversible restrictive pattern, indicative   of decreased left ventricular diastolic compliance and/or   increased left atrial pressure (grade 3 diastolic dysfunction). - Aortic valve: A [employer] mechanical prosthesis was   present. There was mild regurgitation. - Mitral valve: There was mild to moderate regurgitation. - Left atrium: The atrium was moderately dilated. - Tricuspid valve: There was mild-moderate regurgitation. - Pulmonary arteries: Systolic pressure was moderately increased.   PA peak pressure: 52 mm Hg (S).    Neuro/Psych negative neurological ROS  negative psych ROS   GI/Hepatic negative GI ROS, (+) Hepatitis -, C  Endo/Other  diabetes  Renal/GU DialysisRenal disease  negative genitourinary   Musculoskeletal negative musculoskeletal ROS (+)   Abdominal   Peds negative pediatric ROS (+)  Hematology negative hematology ROS (+)   Anesthesia Other Findings   Reproductive/Obstetrics negative OB ROS                              Anesthesia Physical Anesthesia Plan  ASA: IV  Anesthesia Plan: MAC   Post-op Pain Management:    Induction: Intravenous  PONV Risk Score and Plan: 1 and Ondansetron, Dexamethasone and Treatment may vary due to age or medical condition  Airway Management Planned: Simple Face Mask  Additional Equipment:   Intra-op Plan:   Post-operative Plan:   Informed Consent: I have reviewed the patients History and Physical, chart, labs and discussed the procedure including the risks, benefits and alternatives for the proposed anesthesia with the patient or authorized representative who has indicated his/her understanding and acceptance.   Dental advisory given  Plan Discussed with: CRNA and Surgeon  Anesthesia Plan Comments:        Anesthesia Quick Evaluation

## 2016-08-11 NOTE — Interval H&P Note (Signed)
History and Physical Interval Note:  08/11/2016 3:57 PM  [name]  has presented today for surgery, with the diagnosis of Basilic vein   The various methods of treatment have been discussed with the patient and family. After consideration of risks, benefits and other options for treatment, the patient has consented to  Procedure(s): LIGATION OF ARTERIOVENOUS  FISTULA (Left) as a surgical intervention .  The patient's history has been reviewed, patient examined, no change in status, stable for surgery.  I have reviewed the patient's chart and labs.  Questions were answered to the patient's satisfaction.     Ruta Hinds

## 2016-08-11 NOTE — Procedures (Signed)
Patient was seen on dialysis and the procedure was supervised.  BFR 350  Via PC BP is  96/57.   Patient appears to be tolerating treatment well  Lorisa Scheid A 08/11/2016

## 2016-08-11 NOTE — Progress Notes (Signed)
Patient and daughter informed of surgery for Ligation of graft this afternoon per Dr Oneida Alar. Patient understands NPO status.

## 2016-08-11 NOTE — Progress Notes (Signed)
Progress Note  Patient Name: [name] Date of Encounter: 08/11/2016  Primary Cardiologist: Dr [name]   Subjective   Dyspnea has resolved. No chest pain. Complains of lower extremity pain.  Inpatient Medications    Scheduled Meds: . aspirin  81 mg Oral Daily  . calcitRIOL  0.5 mcg Oral Q M,W,F-HD  . calcium acetate  667 mg Oral TID WC  . feeding supplement  1 Container Oral TID BM  . gabapentin  300 mg Oral QHS  . levothyroxine  175 mcg Oral QAC breakfast  . metoCLOPramide  5 mg Oral BID AC  . metoprolol tartrate  12.5 mg Oral BID  . polyethylene glycol  17 g Oral BID  . tacrolimus  2 mg Oral BID  . traMADol  50 mg Oral QID  . Warfarin - Pharmacist Dosing Inpatient   Does not apply q1800   Continuous Infusions: . sodium chloride    . sodium chloride    . sodium chloride    . sodium chloride    . dextrose 25 mL/hr at 08/10/16 2349   PRN Meds: sodium chloride, sodium chloride, sodium chloride, sodium chloride, alteplase, diphenhydrAMINE, heparin, lidocaine (PF), lidocaine-prilocaine, methocarbamol, morphine injection, nitroGLYCERIN, ondansetron (ZOFRAN) IV, ondansetron, pentafluoroprop-tetrafluoroeth   Vital Signs    Vitals:   08/11/16 0707 08/11/16 0711 08/11/16 0730 08/11/16 0800  BP: (!) 95/46 (!) 90/50 (!) 114/51 127/64  Pulse: 68 64 64 67  Resp:      Temp:      TempSrc:      SpO2:      Weight:      Height:        Intake/Output Summary (Last 24 hours) at 08/11/16 0837 Last data filed at 08/11/16 0300  Gross per 24 hour  Intake           679.58 ml  Output                0 ml  Net           679.58 ml   Filed Weights   08/10/16 0015 08/11/16 0539 08/11/16 0706  Weight: 45.7 kg (100 lb 12 oz) 47.2 kg (104 lb 0.9 oz) 47.6 kg (104 lb 15 oz)    Physical Exam   GEN: No acute distress.  Chronically ill appearing Neck: No JVD Cardiac: RRR, crisp mechanical valve Respiratory: Clear to auscultation bilaterally. GI: Soft, nontender, non-distended  MS:  Bilateral BKA Neuro:  Nonfocal  Psych: Normal affect   Labs    Chemistry Recent Labs Lab 08/09/16 2044 08/09/16 2251 08/10/16 1459 08/11/16 0026  NA 133* 133* 129* 132*  K 5.7* 5.7* 6.5* 4.1  CL 95* 93* 92* 96*  CO2 27 29 25 26   GLUCOSE 158* 153* 155* 109*  BUN 17 18 14 20   CREATININE 4.35* 4.33* 3.32* 3.89*  CALCIUM 8.8* 8.7* 9.1 8.5*  PROT 7.8  --   --   --   ALBUMIN 1.8* 1.8*  --   --   AST 62*  --   --   --   ALT 31  --   --   --   ALKPHOS 294*  --   --   --   BILITOT 1.8*  --   --   --   GFRNONAA 10* 10* 14* 12*  GFRAA 12* 12* 16* 13*  ANIONGAP 11 11 12 10      Hematology Recent Labs Lab 08/09/16 2044 08/10/16 0745 08/11/16 0026  WBC 10.3 8.4 7.5  RBC 4.22 4.82 4.19  HGB 12.4 14.2 12.5  HCT 37.5 43.1 37.4  MCV 88.9 89.4 89.3  MCH 29.4 29.5 29.8  MCHC 33.1 32.9 33.4  RDW 16.8* 17.0* 16.7*  PLT 179 125* 168    Cardiac Enzymes Recent Labs Lab 08/09/16 2044 08/10/16 0415 08/10/16 0745  TROPONINI 0.48* 0.47* 0.44*    BNP Recent Labs Lab 08/09/16 1930  BNP >4,500.0*     Radiology    Portable Chest 1 View  Result Date: 08/09/2016 CLINICAL DATA:  Shortness of breath. EXAM: PORTABLE CHEST 1 VIEW COMPARISON:  August 08, 2016 FINDINGS: Stable PermCath. No pneumothorax. The cardiomediastinal silhouette is unchanged. There is a tiny left effusion with underlying atelectasis. There is a moderate right effusion which is larger in the interval. Probable asymmetric edema, right greater than left. IMPRESSION: Bilateral pleural effusions, right greater than left with probable asymmetric edema, right greater than left. The findings have worsened in the interval. Electronically Signed   By: Dorise Bullion III M.D   On: 08/09/2016 18:55    Patient Profile     61 y.o. female with past medical history of coronary artery disease status post coronary artery bypass graft, mechanical aortic valve replacement, end-stage renal disease dialysis dependent, prior liver  transplant admitted with acute dyspnea and pleuritic chest pain. Troponin minimally elevated. Cardiology asked to evaluate.  Assessment & Plan    1 elevated troponin-troponin is minimally elevated with no clear trend. Prior septal infarct cannot be excluded, lateral T-wave inversion. He is unchanged compared to March 2018. Her mild elevation in troponin is likely secondary to volume excess/renal insufficiency. Her symptoms have improved with dialysis. Will await echocardiogram. If unchanged compared to previous with plan medical therapy. Follow-up with Dr. [name] and possible nuclear study as outpatient.  2 acute on chronic combined systolic/diastolic congestive heart failure-volume excess is felt possibly secondary to dietary noncompliance. Her symptoms are much improved following dialysis. Her dry weight may need to be decreased.  3 end-stage renal disease-dialysis per nephrology.  4 Aortic valve replacement-continue Coumadin and aspirin.  5 prior liver transplant   Signed, Kirk Ruths, MD  08/11/2016, 8:37 AM

## 2016-08-11 NOTE — Progress Notes (Signed)
  Echocardiogram 2D Echocardiogram has been performed.  Jennette Dubin 08/11/2016, 12:03 PM

## 2016-08-11 NOTE — Op Note (Signed)
Procedure: ligattion left arm AV fistula  Preop: ischemic steal left hand  Postop: same  Anesthesia: local with sedation  Findings fistula ligated doubly 2 0 silk.  Faint audible radial ulnar doppler at end of case  Details: After obtaining informed consent, the patient was taken the operating room. The patient was placed in supine position operating table. After adequate sedation, the patient's entire left upper extremity was prepped and draped in usual sterile fashion. Local anesthesia was entered at a pre-existing scar just above the antecubital crease. The incision was reopened longitudinally carried down through the subcutaneous tissues down to level of the fistula. The fistula was patent with palpable thrill within it. It was dissected free circumferentially just adjacent to the arterial anastomosis. It was then doubly ligated with 2 separate 2-0 silk ties. Doppler was then used to evaluate the brachial artery. This had biphasic to triphasic flow below the anastomosis. There was a monophasic radial and ulnar Doppler signal adjacent to the wrist. Hemostasis was obtained with direct pressure. The subcutaneous tissues were reapproximated using a running 3-0 Vicryl suture. The skin was closed with a 4 Vicryl subcuticular stitch. The patient tolerated procedure well and there were no complications. Instrument sponge and needle count was correct at the end of the case. The patient was taken to the recovery room in stable condition.  Ruta Hinds, MD [facility] Office: [phone] Pager: [phone]

## 2016-08-11 NOTE — Progress Notes (Signed)
Received patient back from PACU alert and vss

## 2016-08-11 NOTE — Progress Notes (Addendum)
PROGRESS NOTE [facility]   [name]   ZJQ:[id number] DOB: 02/16/1955  DOA: 08/09/2016 PCP: System, Pcp Not In   Brief Narrative:  [name] is a 61 y.o. female with medical history significant of ESRD on HD MWF, Combined CHF, Mechanical Aortic valve on a/c, b/l BKA, Type 2 DM and CAD, presented to [facility] c/o chest pain and SOB. Patient was found to be hypoxic with mild elevated troponin and some EKG changed and was transferred to [facility] for further evaluation. Patient was found to be on pulmonary edema was placed on BiPAP and had emergent  Dialysis.   Subjective: Patient was seen on HD she was doing well, had no complaints. Apparently during dialysis she developed symptoms of steal sx. Vascular was consulted.   Assessment & Plan: Acute on chronic respiratory failure with hypoxia - Resolved - likely due Pulmonary edema ? Contributing factor ACS vs non compliance with diet vs progressive CHF. Initial presentation substernal chest pain, SOB, palpitations and SOB. She was recently discharge 6/23 after being admitted with fluid overload. Had emergency dialysis on 08/09/16 Cardiology recommendations appreciated, they don't feel that is ACS at this point, ECHO pending if no changes plan for further workup as outpatient  Wean O2 as tolerated keep O2 saturation above 92%  Acute on chronic combined systolic and diastolic CHF Elevated troponin Hx of CABG, high risk for ACS, cardiology consulted  Echocardiogram on January 2018 EF of 40-45% with grade 3 diastolic dysfunction and general hypokinesis   Troponin flat, EKG with some repolarization abnormalities Fluid removal with HD  ECHO pending   Steal syndrome  Vascular surgery consulted  For possible ligation tonight   Hyperkalemia - resolved  Treated with Kayexalate, Insulin and dextrose   Essential hypertension May need better control of hypertension, maybe causing progression of CHF Increase  metoprolol Monitor BP  End-stage renal disease HD per nephrology - Dialysis today   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 Not on hypoglycemic agents  A1C in May 2018 5.7 Continue to monitor  Chronic anticoagulation due to mechanical aortic valve  On coumadin - Initially INR supratherapeutic 4.1 trending down from 5.3 Target INR 2.5-3.5  Hold Warfarin in view of possible surgery  FFP were give  Hx of Bilateral BKA's Phantom pain  Continue gabapentin TID and Tramadol PRN   DVT prophylaxis: Warfarin  Code Status: DNR  Family Communication: None at bedside  Disposition Plan: Home when medically stable   Consultants:   Cardiology   Nephrology   Procedures:   HD 08/09/16  Antimicrobials: Anti-infectives    None      Objective: Vitals:   08/11/16 1200 08/11/16 1339 08/11/16 1345 08/11/16 1407  BP: (!) 119/53 (!) 126/59    Pulse: 71 78 78   Resp: 18 16  12   Temp: 97.5 F (36.4 C) 97.7 F (36.5 C)  97.8 F (36.6 C)  TempSrc: Oral Oral  Axillary  SpO2: 100% 97%    Weight:      Height:        Intake/Output Summary (Last 24 hours) at 08/11/16 1424 Last data filed at 08/11/16 1100  Gross per 24 hour  Intake           279.58 ml  Output              500 ml  Net          -220.42 ml   Filed Weights   08/11/16 0539 08/11/16  2725 08/11/16 1036  Weight: 47.2 kg (104 lb 0.9 oz) 47.6 kg (104 lb 15 oz) 47.1 kg (103 lb 13.4 oz)    Examination:  General: NAD, ill appearing Cardiovascular: RRR, metallic click  Respiratory: CTA bilaterally, no wheezing, no rhonchi Abdominal: Soft, NT, ND, bowel sounds + Extremities: b/l BKA, no cyanosis  Data Reviewed: I have personally reviewed following labs and imaging studies  CBC:  Recent Labs Lab 08/09/16 2044 08/10/16 0745 08/11/16 0026  WBC 10.3 8.4 7.5  NEUTROABS  --  5.7  --   HGB 12.4 14.2 12.5  HCT 37.5 43.1 37.4  MCV 88.9 89.4 89.3  PLT 179 125* 366   Basic Metabolic  Panel:  Recent Labs Lab 08/09/16 2044 08/09/16 2251 08/10/16 1459 08/11/16 0026  NA 133* 133* 129* 132*  K 5.7* 5.7* 6.5* 4.1  CL 95* 93* 92* 96*  CO2 27 29 25 26   GLUCOSE 158* 153* 155* 109*  BUN 17 18 14 20   CREATININE 4.35* 4.33* 3.32* 3.89*  CALCIUM 8.8* 8.7* 9.1 8.5*  PHOS  --  6.7*  --   --    GFR: Estimated Creatinine Clearance: 11.3 mL/min (A) (by C-G formula based on SCr of 3.89 mg/dL (H)). Liver Function Tests:  Recent Labs Lab 08/09/16 2044 08/09/16 2251  AST 62*  --   ALT 31  --   ALKPHOS 294*  --   BILITOT 1.8*  --   PROT 7.8  --   ALBUMIN 1.8* 1.8*   No results for input(s): LIPASE, AMYLASE in the last 168 hours. No results for input(s): AMMONIA in the last 168 hours. Coagulation Profile:  Recent Labs Lab 08/08/16 08/10/16 0745 08/11/16 0026  INR 5.3* 2.58 2.55   Cardiac Enzymes:  Recent Labs Lab 08/09/16 2044 08/10/16 0415 08/10/16 0745  TROPONINI 0.48* 0.47* 0.44*   BNP (last 3 results) No results for input(s): PROBNP in the last 8760 hours. HbA1C: No results for input(s): HGBA1C in the last 72 hours. CBG:  Recent Labs Lab 08/10/16 2109 08/10/16 2203 08/10/16 2324 08/11/16 0555 08/11/16 0921  GLUCAP 64* 70 108* 87 78   Lipid Profile: No results for input(s): CHOL, HDL, LDLCALC, TRIG, CHOLHDL, LDLDIRECT in the last 72 hours. Thyroid Function Tests: No results for input(s): TSH, T4TOTAL, FREET4, T3FREE, THYROIDAB in the last 72 hours. Anemia Panel: No results for input(s): VITAMINB12, FOLATE, FERRITIN, TIBC, IRON, RETICCTPCT in the last 72 hours. Sepsis Labs: No results for input(s): PROCALCITON, LATICACIDVEN in the last 168 hours.  Recent Results (from the past 240 hour(s))  MRSA PCR Screening     Status: None   Collection Time: 08/09/16  5:39 PM  Result Value Ref Range Status   MRSA by PCR NEGATIVE NEGATIVE Final    Comment:        The GeneXpert MRSA Assay (FDA approved for NASAL specimens only), is one component of  a comprehensive MRSA colonization surveillance program. It is not intended to diagnose MRSA infection nor to guide or monitor treatment for MRSA infections.       Radiology Studies: Portable Chest 1 View  Result Date: 08/09/2016 CLINICAL DATA:  Shortness of breath. EXAM: PORTABLE CHEST 1 VIEW COMPARISON:  August 08, 2016 FINDINGS: Stable PermCath. No pneumothorax. The cardiomediastinal silhouette is unchanged. There is a tiny left effusion with underlying atelectasis. There is a moderate right effusion which is larger in the interval. Probable asymmetric edema, right greater than left. IMPRESSION: Bilateral pleural effusions, right greater than left with probable asymmetric edema,  right greater than left. The findings have worsened in the interval. Electronically Signed   By: Dorise Bullion III M.D   On: 08/09/2016 18:55    Scheduled Meds: . aspirin  81 mg Oral Daily  . calcitRIOL  0.5 mcg Oral Q M,W,F-HD  . calcium acetate  667 mg Oral TID WC  . feeding supplement  1 Container Oral TID BM  . gabapentin  300 mg Oral QHS  . levothyroxine  175 mcg Oral QAC breakfast  . metoCLOPramide  5 mg Oral BID AC  . metoprolol tartrate  12.5 mg Oral BID  . polyethylene glycol  17 g Oral BID  . tacrolimus  2 mg Oral BID  . traMADol  50 mg Oral QID  . warfarin  1.25 mg Oral ONCE-1800  . Warfarin - Pharmacist Dosing Inpatient   Does not apply q1800   Continuous Infusions: . dextrose 25 mL/hr at 08/10/16 2349     LOS: 2 days    Time spent: Total of 25 minutes spent with pt, greater than 50% of which was spent in discussion of  treatment, counseling and coordination of care    Chipper Oman, MD Pager: Text Page via [email or web address]  [phone]  If 7PM-7AM, please contact night-coverage [email or web address] Password [facility] 08/11/2016, 2:24 PM

## 2016-08-11 NOTE — Progress Notes (Signed)
Asked to see pt for possible steal sx in left hand.    She is s/p left 1st stage BVT on 07/21/16 by Dr. Bridgett Larsson.    She states that when she had a graft placed in the right arm, her entire arm was painful and numb and the graft was ligated.  Earlier this year, she had a non healing wound on her left foot and she underwent ligation of her left thigh AVG at that time with [facility] placement.  Pt saw Dr. Scot Dock last week and had some complaints of numbness in her fingertips.  She was admitted a couple of days ago with shortness of breath.  She states that since she saw Dr. Scot Dock last week, the pain in her hand has worsened and her fingers are numb.  She states it feels better to hand her hand down.    Her radial/ulnar pulses are not palpable.  She does have a left ulnar doppler signal that slightly augments with fistula compression.  Her radial is absent and there is no palmar arch signal.  When asked to close her eyes, she can feel me touching her thumb and 4th and 5th fingers, but not the 1st and 2nd fingers.  Her grip is 3-4/5 on the left and 4/5 on the right.  Fistula has excellent thrill.   Sensation is in tact in the right hand.    Pt is at high risk for steal sx given her past hx with the right arm.  She may need to have her fistula ligated.  She does have hx of ligation of left thigh graft as well.     Dr. Oneida Alar to see pt.  Pt has not eaten today.  Will d/w Dr. Cecile Hearing, [facility] 08/11/2016 12:38 PM

## 2016-08-11 NOTE — Plan of Care (Signed)
Problem: Physical Regulation: Goal: Ability to maintain clinical measurements within normal limits will improve Outcome: Progressing VSS throughout shift. Pt had one episode of symptomatic hypoglycemia at start of shift. Juice given. Other BG have been stable. Repeat potassium WNL. Continue to monitor.

## 2016-08-11 NOTE — Progress Notes (Addendum)
ANTICOAGULATION CONSULT NOTE - Follow Up Consult  Pharmacy Consult for Warfarin Indication: Mechanical AVR  Allergies  Allergen Reactions  . Acetaminophen Other (See Comments)    Liver transplant recipient   . Codeine Itching  . Mirtazapine Other (See Comments)    hallucination    Patient Measurements: Height: 5\' 4"  (162.6 cm) (prior to bilat BKA) Weight: 103 lb 13.4 oz (47.1 kg) IBW/kg (Calculated) : 54.7  Vital Signs: Temp: 97.8 F (36.6 C) (07/02 0706) Temp Source: Oral (07/02 0706) BP: 126/56 (07/02 1036) Pulse Rate: 70 (07/02 1036)  Labs:  Recent Labs  08/09/16 2044 08/09/16 2251 08/10/16 0415 08/10/16 0745 08/10/16 1459 08/11/16 0026  HGB 12.4  --   --  14.2  --  12.5  HCT 37.5  --   --  43.1  --  37.4  PLT 179  --   --  125*  --  168  LABPROT  --   --   --  28.2*  --  27.9*  INR  --   --   --  2.58  --  2.55  CREATININE 4.35* 4.33*  --   --  3.32* 3.89*  TROPONINI 0.48*  --  0.47* 0.44*  --   --     Estimated Creatinine Clearance: 11.3 mL/min (A) (by C-G formula based on SCr of 3.89 mg/dL (H)).   Medical History: Past Medical History:  Diagnosis Date  . Anemia    takes Folic Acid daily  . Anxiety   . Aortic stenosis   . Arthritis    "left hand, back" (08/30/2012)  . Asthma   . CAD (coronary artery disease)   . CAD (coronary artery disease) Jan. 2015   Cath: 20% LAD, 50% D1; s/p LIMA-LAD  . Chest pain 03/06/2015  . CHF (congestive heart failure) ([place]) 05/2016  . Chronic back pain   . Chronic bronchitis ([place])    "q yr w/season changes" (08/30/2012)  . Chronic constipation    takes MIralax and Colace daily  . COPD (chronic obstructive pulmonary disease) ([place])   . Depression    takes Cymbalta for "severe" depression  . End stage renal disease on dialysis [facility]) 02/27/2011   Kidneys shut down at time of liver transplant in Sept 2011 at [facility] in [place], she has been on HD ever since.  Dialyzes at [facility] HD on TTS schedule.  Had L forearm graft  used 10 months then removed Dec 2012 due to suspected infection.  A right upper arm AVG was placed Dec 2012 but she developed steal symptoms acutely and it was ligated the same day.  Never had an AV fistula due to small veins.  Now has L thigh AVG put in Jan 2013, has not clotted to date.    [name] GERD (gastroesophageal reflux disease)    takes Omeprazole daily  . Headache    "at least monthly" (08/30/2012)  . Hepatitis C   . History of blood transfusion    "several" (08/30/2012)  . Hypertension    takes Metoprolol and Lisinopril daily, sees Dr Bea Graff  . Hypothyroidism    takes Synthroid daily  . Migraine    "last migraine was in 2013" (08/30/2012)  . Neuromuscular disorder ([place])    carpal tunnel in right hand  . Obesity   . Peripheral vascular disease (HCC) hands and legs  . Pneumonia    "today and several times before" (08/30/2012)  . 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 S/P liver  transplant ([place])    2011 at 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 S/P unilateral BKA (below knee amputation), left ([place]) 04/30/2016  . SVT (supraventricular tachycardia) ([place]) 06/09/14  . Tobacco abuse   . Type II diabetes mellitus (HCC)    Levemir 2units daily if > 150    Medications:  Scheduled:  . aspirin  81 mg Oral Daily  . calcitRIOL      . calcitRIOL  0.5 mcg Oral Q M,W,F-HD  . calcium acetate  667 mg Oral TID WC  . feeding supplement  1 Container Oral TID BM  . gabapentin  300 mg Oral QHS  . levothyroxine  175 mcg Oral QAC breakfast  . metoCLOPramide  5 mg Oral BID AC  . metoprolol tartrate  12.5 mg Oral BID  . polyethylene glycol  17 g Oral BID  . tacrolimus  2 mg Oral BID  . traMADol  50 mg Oral QID  . Warfarin - Pharmacist Dosing Inpatient   Does not apply q1800   Infusions:  . dextrose 25 mL/hr at 08/10/16 2349    Assessment: 61 yo F with ESRD on HD MWF and h/o liver tx admitted on 08/09/2016 with SOB, CP in the setting of  recent dietary non-compliance requiring emergent HD. On warfarin PTA for mechanical AVR. INR on admission was SUPRAtherapeutic at 5.3.   INR now trended down to goal at 2.55 after 2 held doses, restart 7/1. Hgb/PLTC stable wnl. No s/sx bleeding noted. No new DDI noted. Pt with poor PO intake.  Home dose: 1.25 mg daily except 2.5 mg on T/T/Sun (last dose 6/28 - held x2 days with elevated INR)  Goal of Therapy:  INR 2.5-3.5 Monitor platelets by anticoagulation protocol: Yes   Plan:  - Restart Coumadin 1.25 mg x 1 tonight (home dose) - Daily INR, CBC - Monitor for s/sx bleeding, PO intake, DDI, clinical picture  Carlean Jews, Pharm.D. 7/2/201811:05 AM

## 2016-08-11 NOTE — Consult Note (Signed)
   [facility] Inpatient Consult   08/11/2016  TEQUISHA MAAHS 22-Dec-1955 [id number]    Patient screened for [facility] program for multiple hospitalizations. Chart reviewed. Noted Primary Care MD not listed.   Went to bedside to confirm patient's Primary Care Provider. Ms. Zehring indicates she goes to a traveling doctor for Primary Care in [facility]. She was unsure of the spelling. States his name is Dr. Jacki Cones?  Will make inpatient RNCM aware [facility] unable to follow due to patient not having a [facility] Provider.  [name], MSN-Ed, RN,BSN [facility] [phone]

## 2016-08-11 NOTE — H&P (View-Only) (Signed)
Asked to see pt for possible steal sx in left hand.    She is s/p left 1st stage BVT on 07/21/16 by Dr. Bridgett Larsson.    She states that when she had a graft placed in the right arm, her entire arm was painful and numb and the graft was ligated.  Earlier this year, she had a non healing wound on her left foot and she underwent ligation of her left thigh AVG at that time with [facility] placement.  Pt saw Dr. Scot Dock last week and had some complaints of numbness in her fingertips.  She was admitted a couple of days ago with shortness of breath.  She states that since she saw Dr. Scot Dock last week, the pain in her hand has worsened and her fingers are numb.  She states it feels better to hand her hand down.    Her radial/ulnar pulses are not palpable.  She does have a left ulnar doppler signal that slightly augments with fistula compression.  Her radial is absent and there is no palmar arch signal.  When asked to close her eyes, she can feel me touching her thumb and 4th and 5th fingers, but not the 1st and 2nd fingers.  Her grip is 3-4/5 on the left and 4/5 on the right.  Fistula has excellent thrill.   Sensation is in tact in the right hand.    Pt is at high risk for steal sx given her past hx with the right arm.  She may need to have her fistula ligated.  She does have hx of ligation of left thigh graft as well.     Dr. Oneida Alar to see pt.  Pt has not eaten today.  Will d/w Dr. Cecile Hearing, [facility] 08/11/2016 12:38 PM

## 2016-08-12 ENCOUNTER — Encounter (HOSPITAL_COMMUNITY): Payer: Self-pay | Admitting: Vascular Surgery

## 2016-08-12 ENCOUNTER — Encounter (HOSPITAL_COMMUNITY)
Admission: AD | Disposition: A | Discharge status: Home / Self Care | Payer: Self-pay | Source: Other Acute Inpatient Hospital | Attending: Family Medicine

## 2016-08-12 DIAGNOSIS — T82898S Other specified complication of vascular prosthetic devices, implants and grafts, sequela: Secondary | ICD-10-CM

## 2016-08-12 DIAGNOSIS — I25119 Atherosclerotic heart disease of native coronary artery with unspecified angina pectoris: Secondary | ICD-10-CM

## 2016-08-12 HISTORY — PX: CORONARY ANGIOGRAPHY: CATH118303

## 2016-08-12 LAB — CBC
HCT: 36.1 % (ref 36.0–46.0)
Hemoglobin: 11.5 g/dL — ABNORMAL LOW (ref 12.0–15.0)
MCH: 28.4 pg (ref 26.0–34.0)
MCHC: 31.9 g/dL (ref 30.0–36.0)
MCV: 89.1 fL (ref 78.0–100.0)
PLATELETS: 148 10*3/uL — AB (ref 150–400)
RBC: 4.05 MIL/uL (ref 3.87–5.11)
RDW: 16.5 % — AB (ref 11.5–15.5)
WBC: 5.6 10*3/uL (ref 4.0–10.5)

## 2016-08-12 LAB — PROTIME-INR
INR: 1.97
INR: 1.87
PROTHROMBIN TIME: 22.7 s — AB (ref 11.4–15.2)
Prothrombin Time: 21.8 seconds — ABNORMAL HIGH (ref 11.4–15.2)

## 2016-08-12 LAB — BASIC METABOLIC PANEL
Anion gap: 13 (ref 5–15)
BUN: 16 mg/dL (ref 6–20)
CO2: 21 mmol/L — ABNORMAL LOW (ref 22–32)
Calcium: 9 mg/dL (ref 8.9–10.3)
Chloride: 96 mmol/L — ABNORMAL LOW (ref 101–111)
Creatinine, Ser: 3.55 mg/dL — ABNORMAL HIGH (ref 0.44–1.00)
GFR calc Af Amer: 15 mL/min — ABNORMAL LOW (ref >60)
GFR, EST NON AFRICAN AMERICAN: 13 mL/min — AB (ref >60)
Glucose, Bld: 83 mg/dL (ref 65–99)
POTASSIUM: 5.1 mmol/L (ref 3.5–5.1)
SODIUM: 130 mmol/L — AB (ref 135–145)

## 2016-08-12 LAB — GLUCOSE, CAPILLARY
GLUCOSE-CAPILLARY: 77 mg/dL (ref 65–99)
GLUCOSE-CAPILLARY: 183 mg/dL — AB (ref 65–99)

## 2016-08-12 SURGERY — CORONARY ANGIOGRAPHY (CATH LAB)
Anesthesia: LOCAL

## 2016-08-12 MED ORDER — SODIUM CHLORIDE 0.9% FLUSH: 3.0000 mL | INTRAVENOUS | Status: DC | PRN

## 2016-08-12 MED ORDER — FENTANYL CITRATE (PF) 100 MCG/2ML IJ SOLN
INTRAMUSCULAR | Status: AC
  Filled 2016-08-12: qty 2

## 2016-08-12 MED ORDER — WARFARIN SODIUM 5 MG PO TABS: 5.0000 mg | ORAL_TABLET | Freq: Every day | ORAL | Status: DC

## 2016-08-12 MED ORDER — IOPAMIDOL (ISOVUE-370) INJECTION 76%
INTRAVENOUS | Status: AC
  Filled 2016-08-12: qty 200

## 2016-08-12 MED ORDER — CARVEDILOL 3.125 MG PO TABS
3.1250 mg | ORAL_TABLET | Freq: Two times a day (BID) | ORAL | Status: DC
  Administered 2016-08-13 – 2016-08-14 (×3): 3.125 mg via ORAL
  Filled 2016-08-12 (×3): qty 1

## 2016-08-12 MED ORDER — SODIUM CHLORIDE 0.9 % IV SOLN: INTRAVENOUS | Status: DC

## 2016-08-12 MED ORDER — WARFARIN - PHARMACIST DOSING INPATIENT: Freq: Every day | Status: DC

## 2016-08-12 MED ORDER — SODIUM CHLORIDE 0.9% FLUSH
3.0000 mL | Freq: Two times a day (BID) | INTRAVENOUS | Status: DC
  Administered 2016-08-13: 3 mL via INTRAVENOUS

## 2016-08-12 MED ORDER — IOPAMIDOL (ISOVUE-370) INJECTION 76%
INTRAVENOUS | Status: AC
  Filled 2016-08-12: qty 100

## 2016-08-12 MED ORDER — HEPARIN (PORCINE) IN NACL 100-0.45 UNIT/ML-% IJ SOLN
800.0000 [IU]/h | INTRAMUSCULAR | Status: DC
  Administered 2016-08-13: 600 [IU]/h via INTRAVENOUS
  Filled 2016-08-12: qty 250

## 2016-08-12 MED ORDER — MIDAZOLAM HCL 2 MG/2ML IJ SOLN
INTRAMUSCULAR | Status: AC
  Filled 2016-08-12: qty 2

## 2016-08-12 MED ORDER — SODIUM CHLORIDE 0.9 % IV SOLN: 250.0000 mL | INTRAVENOUS | Status: DC | PRN

## 2016-08-12 MED ORDER — HEPARIN (PORCINE) IN NACL 2-0.9 UNIT/ML-% IJ SOLN
INTRAMUSCULAR | Status: AC
  Filled 2016-08-12: qty 500

## 2016-08-12 MED ORDER — LIDOCAINE HCL (PF) 1 % IJ SOLN
INTRAMUSCULAR | Status: AC
  Filled 2016-08-12: qty 30

## 2016-08-12 MED ORDER — LIDOCAINE HCL (PF) 1 % IJ SOLN
INTRAMUSCULAR | Status: DC | PRN
  Administered 2016-08-12: 20 mL

## 2016-08-12 MED ORDER — HEPARIN (PORCINE) IN NACL 2-0.9 UNIT/ML-% IJ SOLN
INTRAMUSCULAR | Status: AC | PRN
  Administered 2016-08-12: 1000 mL

## 2016-08-12 MED ORDER — SODIUM CHLORIDE 0.9% FLUSH
3.0000 mL | Freq: Two times a day (BID) | INTRAVENOUS | Status: DC
  Administered 2016-08-12: 3 mL via INTRAVENOUS

## 2016-08-12 MED ORDER — ASPIRIN 81 MG PO CHEW: 81.0000 mg | CHEWABLE_TABLET | ORAL | Status: DC

## 2016-08-12 MED ORDER — FENTANYL CITRATE (PF) 100 MCG/2ML IJ SOLN
INTRAMUSCULAR | Status: DC | PRN
  Administered 2016-08-12: 25 ug via INTRAVENOUS

## 2016-08-12 MED ORDER — WARFARIN SODIUM 2.5 MG PO TABS
2.5000 mg | ORAL_TABLET | Freq: Once | ORAL | Status: AC
  Administered 2016-08-12: 2.5 mg via ORAL
  Filled 2016-08-12: qty 1

## 2016-08-12 MED ORDER — IOPAMIDOL (ISOVUE-370) INJECTION 76%
INTRAVENOUS | Status: DC | PRN
  Administered 2016-08-12: 80 mL via INTRA_ARTERIAL

## 2016-08-12 MED ORDER — MIDAZOLAM HCL 2 MG/2ML IJ SOLN
INTRAMUSCULAR | Status: DC | PRN
  Administered 2016-08-12: 1 mg via INTRAVENOUS

## 2016-08-12 SURGICAL SUPPLY — 13 items
CATH INFINITI 5 FR IM (CATHETERS) ×2 IMPLANT
CATH INFINITI 5FR JL4 (CATHETERS) ×2 IMPLANT
CATH INFINITI JR4 5F (CATHETERS) ×2 IMPLANT
COVER PRB 48X5XTLSCP FOLD TPE (BAG) ×1 IMPLANT
COVER PROBE 5X48 (BAG) ×1
KIT HEART LEFT (KITS) ×2 IMPLANT
KIT MICROINTRODUCER STIFF 5F (SHEATH) ×2 IMPLANT
PACK CARDIAC CATHETERIZATION (CUSTOM PROCEDURE TRAY) ×2 IMPLANT
SHEATH PINNACLE 5F 10CM (SHEATH) ×2 IMPLANT
TRANSDUCER W/STOPCOCK (MISCELLANEOUS) ×2 IMPLANT
TUBING CIL FLEX 10 FLL-RA (TUBING) ×2 IMPLANT
WIRE EMERALD 3MM-J .035X150CM (WIRE) ×2 IMPLANT
WIRE EMERALD 3MM-J .035X260CM (WIRE) ×2 IMPLANT

## 2016-08-12 NOTE — Progress Notes (Addendum)
  Progress Note    08/12/2016 7:50 AM 1 Day Post-Op  Subjective:  "I feel wonderful".  Says her hand is much better.  Afebrile VSS 100% RA  Vitals:   08/12/16 0539 08/12/16 0600  BP: 123/76   Pulse:    Resp:  (!) 28  Temp:      Physical Exam: Lungs:  Non labored Incisions:  Clean and dry Extremities:  Bilateral hand grips are now equal   CBC    Component Value Date/Time   WBC 5.6 08/12/2016 0339   RBC 4.05 08/12/2016 0339   HGB 11.5 (L) 08/12/2016 0339   HCT 36.1 08/12/2016 0339   PLT 148 (L) 08/12/2016 0339   MCV 89.1 08/12/2016 0339   MCH 28.4 08/12/2016 0339   MCHC 31.9 08/12/2016 0339   RDW 16.5 (H) 08/12/2016 0339   LYMPHSABS 1.2 08/10/2016 0745   MONOABS 1.4 (H) 08/10/2016 0745   EOSABS 0.0 08/10/2016 0745   BASOSABS 0.0 08/10/2016 0745    BMET    Component Value Date/Time   NA 132 (L) 08/11/2016 0026   K 4.1 08/11/2016 0026   CL 96 (L) 08/11/2016 0026   CO2 26 08/11/2016 0026   GLUCOSE 109 (H) 08/11/2016 0026   BUN 20 08/11/2016 0026   CREATININE 3.89 (H) 08/11/2016 0026   CREATININE 6.42 (H) 01/17/2016 1233   CALCIUM 8.5 (L) 08/11/2016 0026   GFRNONAA 12 (L) 08/11/2016 0026   GFRAA 13 (L) 08/11/2016 0026    INR    Component Value Date/Time   INR 1.97 08/12/2016 0339   INR 4.0 03/31/2016     Intake/Output Summary (Last 24 hours) at 08/12/16 0750 Last data filed at 08/11/16 2000  Gross per 24 hour  Intake              701 ml  Output              510 ml  Net              191 ml     Assessment:  61 y.o. female is s/p:  ligation left arm AV fistula  1 Day Post-Op  Plan: -pt doing much better this morning.  Grip in left hand is improved and pain is improved. -given her extensive steal sx with each access, she will most likely require [facility] permanently. -may resume coumadin/heparin    Leontine Locket, PA-C [facility] [phone] 08/12/2016 7:50 AM   Agree will sign off.  Ruta Hinds, MD [facility] [place] Office: [phone] Pager: [phone]

## 2016-08-12 NOTE — Progress Notes (Signed)
Pharmacist Heart Failure Core Measure Documentation  Assessment: [name] has an EF documented as 30% on 08/11/16 ECHO  Rationale: Heart failure patients with left ventricular systolic dysfunction (LVSD) and an EF < 40% should be prescribed an angiotensin converting enzyme inhibitor (ACEI) or angiotensin receptor blocker (ARB) at discharge unless a contraindication is documented in the medical record.  This patient is not currently on an ACEI or ARB for HF.  This note is being placed in the record in order to provide documentation that a contraindication to the use of these agents is present for this encounter.  ACE Inhibitor or Angiotensin Receptor Blocker is contraindicated (specify all that apply)  []   ACEI allergy AND ARB allergy []   Angioedema []   Moderate or severe aortic stenosis []   Hyperkalemia [x]   Hypotension []   Renal artery stenosis [x]   Worsening renal function, preexisting renal disease or dysfunction   Carlean Jews 08/12/2016 10:00 AM

## 2016-08-12 NOTE — Care Management Note (Signed)
Case Management Note  Patient Details  Name: [name] MRN: [id number] Date of Birth: Dec 05, 1955  Subjective/Objective:  Pt presented for SOB and CP-Plan for cardiac cath 08-12-16. Hx ESRD- HD MWF schedule.  Pt is from home with family support. Pt has had several admissions- would benefit from SNF. CSW did speak with patient and she is declining SNF at this time.                   Action/Plan: Pt is currently active with [facility]. Liaison is aware that pt is hospitalized. [facility] did follow up with patient as well. No further needs at this time.   Expected Discharge Date:                  Expected Discharge Plan:  Skilled Nursing Facility  In-House Referral:  Clinical Social Work (Pt declining SNF)  Discharge planning Services  CM Consult  Post Acute Care Choice:  Home Health, Resumption of Svcs/PTA Provider Choice offered to:  Patient  DME Arranged:    DME Agency:     HH Arranged:  RN, PT [place] Agency:  [facility]  Status of Service:  In process, will continue to follow  If discussed at Long Length of Stay Meetings, dates discussed:    Additional Comments:  Bethena Roys, RN 08/12/2016, 6:06 PM

## 2016-08-12 NOTE — Interval H&P Note (Signed)
History and Physical Interval Note:  08/12/2016 4:48 PM  [name]  has presented today for surgery, with the diagnosis of ? ACS - acute CHF with ? NSTEMI. Known CAD. The various methods of treatment have been discussed with the patient and family. After consideration of risks, benefits and other options for treatment, the patient has consented to  Procedure(s): Left Heart Cath and Coronary Angiography (N/A) with possible Percutaneous Coronary Intervention as a surgical intervention .  The patient's history has been reviewed, patient examined, no change in status, stable for surgery.  I have reviewed the patient's chart and labs.  Questions were answered to the patient's satisfaction.    Cath Lab Visit (complete for each Cath Lab visit)  Clinical Evaluation Leading to the Procedure:   ACS: Yes.    Non-ACS:    Anginal Classification: CCS III  Anti-ischemic medical therapy: Minimal Therapy (1 class of medications)  Non-Invasive Test Results: No non-invasive testing performed  Prior CABG: Previous CABG    Glenetta Hew

## 2016-08-12 NOTE — Plan of Care (Signed)
Problem: Skin Integrity: Goal: Risk for impaired skin integrity will decrease Outcome: Not Met (add Reason) Pt with pressure ulcer to coccyx, present on admission.

## 2016-08-12 NOTE — Progress Notes (Signed)
PROGRESS NOTE [facility]   [name]   TZG:[id number] DOB: 10-13-1955  DOA: 08/09/2016 PCP: System, Pcp Not In   Brief Narrative:  [name] is a 60 y.o. female with medical history significant of ESRD on HD MWF, Combined CHF, Mechanical Aortic valve on a/c, b/l BKA, Type 2 DM and CAD, presented to [facility] c/o chest pain and SOB. Patient was found to be hypoxic with mild elevated troponin and some EKG changed and was transferred to [facility] for further evaluation. Patient was found to be on pulmonary edema was placed on BiPAP and had emergent  Dialysis. Cardiac work up revealed EKG changes and ECHO with new ischemic changes. Cardiology was consulted. She is scheduled for cardiac cath on 08/13/16 if INR 1.8. During HD on 08/11/16 she has symptoms of steal syndrome during dialysis and she underwent AVF ligation.   Subjective: Patient seen and examined, feeling great, has no complaints this am. Yesterday after dialysis she had sx of steal syndrome, she went under AVF ligation.   Assessment & Plan: Acute on chronic respiratory failure with hypoxia - Resolved - likely due Pulmonary edema ? Contributing factor ACS vs non compliance with diet vs progressive CHF. Initial presentation substernal chest pain, SOB, palpitations and SOB. She was recently discharge 6/23 after being admitted with fluid overload. Had emergency dialysis on 08/09/16 Etiology likely CAD - ECHO with lateral wall akinesis of mid/distal inferoseptal septal wall.   Cardiology taking patient for cardiac cath in AM if INR 1.8 Wean O2 as tolerated keep O2 saturation above 92%  Acute on chronic combined systolic and diastolic CHF Elevated troponin Hx of CABG, high risk for ACS, cardiology consulted  Echocardiogram on January 2018 EF of 40-45% with grade 3 diastolic dysfunction and general hypokinesis. - new ECHO worsen,  EF 30% - cardiology adjusting medications  Troponin flat Fluid removal with HD.     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 Not on hypoglycemic agents  A1C in May 2018 5.7 Continue to monitor  Chronic anticoagulation due to mechanical aortic valve  On coumadin - Initially INR supratherapeutic 4.1 trending down from 5.3 Target INR 2.5-3.5  Hold Warfarin in view of cardiac cath  FFP were given  Hx of Bilateral BKA's Phantom pain  Continue gabapentin TID and Tramadol PRN   DVT prophylaxis: Warfarin  Code Status: DNR  Family Communication: None at bedside  Disposition Plan: Home when medically stable   Consultants:   Cardiology   Nephrology   Procedures:   HD 08/09/16  Antimicrobials: Anti-infectives    None      Objective: Vitals:   08/12/16 0400 08/12/16 0500 08/12/16 0539 08/12/16 0600  BP:   123/76   Pulse: 73 65    Resp: 18 12  (!) 28  Temp:      TempSrc:      SpO2: 100% 98%    Weight:      Height:        Intake/Output Summary (Last 24 hours) at 08/12/16 0804 Last data filed at 08/11/16 2000  Gross per 24 hour  Intake  701 ml  Output              510 ml  Net              191 ml   Filed Weights   08/11/16 0539 08/11/16 0706 08/11/16 1036  Weight: 47.2 kg (104 lb 0.9 oz) 47.6 kg (104 lb 15 oz) 47.1 kg (103 lb 13.4 oz)    Examination: No changes on physical exam from 08/11/2016  General: NAD, ill appearing Cardiovascular: RRR, metallic click  Respiratory: CTA bilaterally, no wheezing, no rhonchi Abdominal: Soft, NT, ND, bowel sounds + Extremities: b/l BKA, no cyanosis  Data Reviewed: I have personally reviewed following labs and imaging  studies  CBC:  Recent Labs Lab 08/09/16 2044 08/10/16 0745 08/11/16 0026 08/12/16 0339  WBC 10.3 8.4 7.5 5.6  NEUTROABS  --  5.7  --   --   HGB 12.4 14.2 12.5 11.5*  HCT 37.5 43.1 37.4 36.1  MCV 88.9 89.4 89.3 89.1  PLT 179 125* 168 856*   Basic Metabolic Panel:  Recent Labs Lab 08/09/16 2044 08/09/16 2251 08/10/16 1459 08/11/16 0026  NA 133* 133* 129* 132*  K 5.7* 5.7* 6.5* 4.1  CL 95* 93* 92* 96*  CO2 27 29 25 26   GLUCOSE 158* 153* 155* 109*  BUN 17 18 14 20   CREATININE 4.35* 4.33* 3.32* 3.89*  CALCIUM 8.8* 8.7* 9.1 8.5*  PHOS  --  6.7*  --   --    GFR: Estimated Creatinine Clearance: 11.3 mL/min (A) (by C-G formula based on SCr of 3.89 mg/dL (H)). Liver Function Tests:  Recent Labs Lab 08/09/16 2044 08/09/16 2251  AST 62*  --   ALT 31  --   ALKPHOS 294*  --   BILITOT 1.8*  --   PROT 7.8  --   ALBUMIN 1.8* 1.8*   No results for input(s): LIPASE, AMYLASE in the last 168 hours. No results for input(s): AMMONIA in the last 168 hours. Coagulation Profile:  Recent Labs Lab 08/08/16 08/10/16 0745 08/11/16 0026 08/12/16 0339  INR 5.3* 2.58 2.55 1.97   Cardiac Enzymes:  Recent Labs Lab 08/09/16 2044 08/10/16 0415 08/10/16 0745  TROPONINI 0.48* 0.47* 0.44*   BNP (last 3 results) No results for input(s): PROBNP in the last 8760 hours. HbA1C: No results for input(s): HGBA1C in the last 72 hours. CBG:  Recent Labs Lab 08/11/16 0555 08/11/16 0921 08/11/16 1457 08/11/16 1529 08/11/16 1748  GLUCAP 87 78 44* 100* 81   Lipid Profile: No results for input(s): CHOL, HDL, LDLCALC, TRIG, CHOLHDL, LDLDIRECT in the last 72 hours. Thyroid Function Tests: No results for input(s): TSH, T4TOTAL, FREET4, T3FREE, THYROIDAB in the last 72 hours. Anemia Panel: No results for input(s): VITAMINB12, FOLATE, FERRITIN, TIBC, IRON, RETICCTPCT in the last 72 hours. Sepsis Labs: No results for input(s): PROCALCITON, LATICACIDVEN in the last 168 hours.  Recent  Results (from the past 240 hour(s))  MRSA PCR Screening     Status: None   Collection Time: 08/09/16  5:39 PM  Result Value Ref Range Status   MRSA by PCR NEGATIVE NEGATIVE Final    Comment:        The GeneXpert MRSA Assay (FDA approved for NASAL specimens only), is one component of a comprehensive MRSA colonization surveillance program. It is not intended to diagnose MRSA infection nor to guide or monitor treatment for MRSA infections.       Radiology Studies: No results found.  Scheduled Meds: . aspirin  81 mg Oral Daily  . calcitRIOL  0.5 mcg Oral Q M,W,F-HD  . calcium acetate  667 mg Oral TID WC  . feeding supplement  1 Container Oral TID BM  . gabapentin  300 mg Oral QHS  . levothyroxine  175 mcg Oral QAC breakfast  . metoCLOPramide  5 mg Oral BID AC  . metoprolol tartrate  12.5 mg Oral BID  . polyethylene glycol  17 g Oral BID  . tacrolimus  2 mg Oral BID  . traMADol  50 mg Oral QID  . warfarin  1.25 mg Oral ONCE-1800  . Warfarin - Pharmacist Dosing Inpatient   Does not apply q1800   Continuous Infusions: . sodium chloride 10 mL/hr at 08/11/16 1603  . dextrose 25 mL/hr at 08/10/16 2349     LOS: 3 days    Time spent: Total of 15 minutes spent with pt, greater than 50% of which was spent in discussion of  treatment, counseling and coordination of care    Chipper Oman, MD Pager: Text Page via [email or web address]  [phone]  If 7PM-7AM, please contact night-coverage [email or web address] Password [email or web address] 08/12/2016, 8:04 AM

## 2016-08-12 NOTE — H&P (View-Only) (Signed)
Progress Note  Patient Name: [name] Date of Encounter: 08/12/2016  Primary Cardiologist: Dr [name]   Subjective   Pt denies CP or dyspnea  Inpatient Medications    Scheduled Meds: . aspirin  81 mg Oral Daily  . calcitRIOL  0.5 mcg Oral Q M,W,F-HD  . calcium acetate  667 mg Oral TID WC  . feeding supplement  1 Container Oral TID BM  . gabapentin  300 mg Oral QHS  . levothyroxine  175 mcg Oral QAC breakfast  . metoCLOPramide  5 mg Oral BID AC  . metoprolol tartrate  12.5 mg Oral BID  . polyethylene glycol  17 g Oral BID  . tacrolimus  2 mg Oral BID  . traMADol  50 mg Oral QID  . warfarin  1.25 mg Oral ONCE-1800  . Warfarin - Pharmacist Dosing Inpatient   Does not apply q1800   Continuous Infusions: . sodium chloride 10 mL/hr at 08/11/16 1603  . dextrose 25 mL/hr at 08/10/16 2349   PRN Meds: diphenhydrAMINE, methocarbamol, morphine injection, nitroGLYCERIN, ondansetron (ZOFRAN) IV, ondansetron   Vital Signs    Vitals:   08/12/16 0400 08/12/16 0500 08/12/16 0539 08/12/16 0600  BP:   123/76   Pulse: 73 65    Resp: 18 12  (!) 28  Temp:      TempSrc:      SpO2: 100% 98%    Weight:      Height:        Intake/Output Summary (Last 24 hours) at 08/12/16 0908 Last data filed at 08/11/16 2000  Gross per 24 hour  Intake              701 ml  Output              510 ml  Net              191 ml   Filed Weights   08/11/16 0539 08/11/16 0706 08/11/16 1036  Weight: 47.2 kg (104 lb 0.9 oz) 47.6 kg (104 lb 15 oz) 47.1 kg (103 lb 13.4 oz)    Physical Exam   GEN: WD, chronically ill appearing in no acute distress.   Neck: supple Cardiac: RRR, crisp mechanical valve; no murmur Respiratory: Clear to auscultation bilaterally. No wheeze GI: Soft, nontender, non-distended, no masses  MS: s/p Bilateral BKA Neuro:  grossly intact   Labs    Chemistry  Recent Labs Lab 08/09/16 2044 08/09/16 2251 08/10/16 1459 08/11/16 0026  NA 133* 133* 129* 132*  K 5.7*  5.7* 6.5* 4.1  CL 95* 93* 92* 96*  CO2 27 29 25 26   GLUCOSE 158* 153* 155* 109*  BUN 17 18 14 20   CREATININE 4.35* 4.33* 3.32* 3.89*  CALCIUM 8.8* 8.7* 9.1 8.5*  PROT 7.8  --   --   --   ALBUMIN 1.8* 1.8*  --   --   AST 62*  --   --   --   ALT 31  --   --   --   ALKPHOS 294*  --   --   --   BILITOT 1.8*  --   --   --   GFRNONAA 10* 10* 14* 12*  GFRAA 12* 12* 16* 13*  ANIONGAP 11 11 12 10      Hematology  Recent Labs Lab 08/10/16 0745 08/11/16 0026 08/12/16 0339  WBC 8.4 7.5 5.6  RBC 4.82 4.19 4.05  HGB 14.2 12.5 11.5*  HCT 43.1 37.4 36.1  MCV  89.4 89.3 89.1  MCH 29.5 29.8 28.4  MCHC 32.9 33.4 31.9  RDW 17.0* 16.7* 16.5*  PLT 125* 168 148*    Cardiac Enzymes  Recent Labs Lab 08/09/16 2044 08/10/16 0415 08/10/16 0745  TROPONINI 0.48* 0.47* 0.44*    BNP  Recent Labs Lab 08/09/16 1930  BNP >4,500.0*      Patient Profile     61 y.o. female with past medical history of coronary artery disease status post coronary artery bypass graft, mechanical aortic valve replacement, end-stage renal disease dialysis dependent, prior liver transplant admitted with acute dyspnea and pleuritic chest pain. Troponin minimally elevated. Cardiology asked to evaluate.  Assessment & Plan    1 elevated troponin-Patient ruled in for a non-ST elevation myocardial infarction. Some of elevation may be related to volume excess/renal insufficiency. However I have personally reviewed his echocardiogram and LV function is newly reduced compared to previous as well. I feel definitive evaluation of coronaries is indicated. We will plan cardiac catheterization tomorrow if INR < 1.8. The risks and benefits including myocardial infarction, CVA and death were discussed and she agrees to proceed.  2 acute on chronic combined systolic/diastolic congestive heart failure-initial evaluation with thoughts that volume excess possibly secondary to dietary noncompliance. However LV function is newly  reduced. Ischemia evaluation as outlined above. Her symptoms are much improved following dialysis. Her dry weight may need to be decreased.  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 Catheterization as outlined above. Discontinue metoprolol. Treat with carvedilol 3.125 mg twice a day. I will not add an ARB as blood pressure likely will not allow particularly on dialysis days.  4 end-stage renal disease-dialysis per nephrology.  4 Aortic valve replacement-hold Coumadin for possible catheterization tomorrow. Resume after procedure. Continue aspirin.  5 prior liver transplant   Signed, Kirk Ruths, MD  08/12/2016, 9:08 AM

## 2016-08-12 NOTE — Progress Notes (Signed)
Progress Note  Patient Name: [name] Date of Encounter: 08/12/2016  Primary Cardiologist: Dr [name]   Subjective   Pt denies CP or dyspnea  Inpatient Medications    Scheduled Meds: . aspirin  81 mg Oral Daily  . calcitRIOL  0.5 mcg Oral Q M,W,F-HD  . calcium acetate  667 mg Oral TID WC  . feeding supplement  1 Container Oral TID BM  . gabapentin  300 mg Oral QHS  . levothyroxine  175 mcg Oral QAC breakfast  . metoCLOPramide  5 mg Oral BID AC  . metoprolol tartrate  12.5 mg Oral BID  . polyethylene glycol  17 g Oral BID  . tacrolimus  2 mg Oral BID  . traMADol  50 mg Oral QID  . warfarin  1.25 mg Oral ONCE-1800  . Warfarin - Pharmacist Dosing Inpatient   Does not apply q1800   Continuous Infusions: . sodium chloride 10 mL/hr at 08/11/16 1603  . dextrose 25 mL/hr at 08/10/16 2349   PRN Meds: diphenhydrAMINE, methocarbamol, morphine injection, nitroGLYCERIN, ondansetron (ZOFRAN) IV, ondansetron   Vital Signs    Vitals:   08/12/16 0400 08/12/16 0500 08/12/16 0539 08/12/16 0600  BP:   123/76   Pulse: 73 65    Resp: 18 12  (!) 28  Temp:      TempSrc:      SpO2: 100% 98%    Weight:      Height:        Intake/Output Summary (Last 24 hours) at 08/12/16 0908 Last data filed at 08/11/16 2000  Gross per 24 hour  Intake              701 ml  Output              510 ml  Net              191 ml   Filed Weights   08/11/16 0539 08/11/16 0706 08/11/16 1036  Weight: 47.2 kg (104 lb 0.9 oz) 47.6 kg (104 lb 15 oz) 47.1 kg (103 lb 13.4 oz)    Physical Exam   GEN: WD, chronically ill appearing in no acute distress.   Neck: supple Cardiac: RRR, crisp mechanical valve; no murmur Respiratory: Clear to auscultation bilaterally. No wheeze GI: Soft, nontender, non-distended, no masses  MS: s/p Bilateral BKA Neuro:  grossly intact   Labs    Chemistry  Recent Labs Lab 08/09/16 2044 08/09/16 2251 08/10/16 1459 08/11/16 0026  NA 133* 133* 129* 132*  K 5.7*  5.7* 6.5* 4.1  CL 95* 93* 92* 96*  CO2 27 29 25 26   GLUCOSE 158* 153* 155* 109*  BUN 17 18 14 20   CREATININE 4.35* 4.33* 3.32* 3.89*  CALCIUM 8.8* 8.7* 9.1 8.5*  PROT 7.8  --   --   --   ALBUMIN 1.8* 1.8*  --   --   AST 62*  --   --   --   ALT 31  --   --   --   ALKPHOS 294*  --   --   --   BILITOT 1.8*  --   --   --   GFRNONAA 10* 10* 14* 12*  GFRAA 12* 12* 16* 13*  ANIONGAP 11 11 12 10      Hematology  Recent Labs Lab 08/10/16 0745 08/11/16 0026 08/12/16 0339  WBC 8.4 7.5 5.6  RBC 4.82 4.19 4.05  HGB 14.2 12.5 11.5*  HCT 43.1 37.4 36.1  MCV  89.4 89.3 89.1  MCH 29.5 29.8 28.4  MCHC 32.9 33.4 31.9  RDW 17.0* 16.7* 16.5*  PLT 125* 168 148*    Cardiac Enzymes  Recent Labs Lab 08/09/16 2044 08/10/16 0415 08/10/16 0745  TROPONINI 0.48* 0.47* 0.44*    BNP  Recent Labs Lab 08/09/16 1930  BNP >4,500.0*      Patient Profile     61 y.o. female with past medical history of coronary artery disease status post coronary artery bypass graft, mechanical aortic valve replacement, end-stage renal disease dialysis dependent, prior liver transplant admitted with acute dyspnea and pleuritic chest pain. Troponin minimally elevated. Cardiology asked to evaluate.  Assessment & Plan    1 elevated troponin-Patient ruled in for a non-ST elevation myocardial infarction. Some of elevation may be related to volume excess/renal insufficiency. However I have personally reviewed his echocardiogram and LV function is newly reduced compared to previous as well. I feel definitive evaluation of coronaries is indicated. We will plan cardiac catheterization tomorrow if INR < 1.8. The risks and benefits including myocardial infarction, CVA and death were discussed and she agrees to proceed.  2 acute on chronic combined systolic/diastolic congestive heart failure-initial evaluation with thoughts that volume excess possibly secondary to dietary noncompliance. However LV function is newly  reduced. Ischemia evaluation as outlined above. Her symptoms are much improved following dialysis. Her dry weight may need to be decreased.  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 Catheterization as outlined above. Discontinue metoprolol. Treat with carvedilol 3.125 mg twice a day. I will not add an ARB as blood pressure likely will not allow particularly on dialysis days.  4 end-stage renal disease-dialysis per nephrology.  4 Aortic valve replacement-hold Coumadin for possible catheterization tomorrow. Resume after procedure. Continue aspirin.  5 prior liver transplant   Signed, Kirk Ruths, MD  08/12/2016, 9:08 AM

## 2016-08-12 NOTE — Progress Notes (Signed)
[facility] Progress Note  Subjective: Appreciate [facility] prompt assist, they ligated her AVF- feels better - no SOB- removed only 500 due to I think mostly pain- but got to 47 (1 kg under EDW).  Cards is now saying that she may need cardiac cath   Vitals:   08/12/16 0400 08/12/16 0500 08/12/16 0539 08/12/16 0600  BP:   123/76   Pulse: 73 65    Resp: 18 12  (!) 28  Temp:      TempSrc:      SpO2: 100% 98%    Weight:      Height:        Inpatient medications: . aspirin  81 mg Oral Daily  . calcitRIOL  0.5 mcg Oral Q M,W,F-HD  . calcium acetate  667 mg Oral TID WC  . feeding supplement  1 Container Oral TID BM  . gabapentin  300 mg Oral QHS  . levothyroxine  175 mcg Oral QAC breakfast  . metoCLOPramide  5 mg Oral BID AC  . metoprolol tartrate  12.5 mg Oral BID  . polyethylene glycol  17 g Oral BID  . tacrolimus  2 mg Oral BID  . traMADol  50 mg Oral QID  . warfarin  1.25 mg Oral ONCE-1800  . Warfarin - Pharmacist Dosing Inpatient   Does not apply q1800   . sodium chloride 10 mL/hr at 08/11/16 1603  . dextrose 25 mL/hr at 08/10/16 2349   diphenhydrAMINE, methocarbamol, morphine injection, nitroGLYCERIN, ondansetron (ZOFRAN) IV, ondansetron  Exam: NAD, alert, nasal O2 No jvd Chest dec'd at bases, o/w clear RRR 2/6 SEM Abd soft ntnd no ascites Ext bilat LE amp, no edema NF, Ox 3 R IJ TDC  Dialysis: [name] MWF 3.5h  R IJ Cath   3K/2.25 bath 48kg  Hep none - Calcitriol 0.5 ug       Assess: 1  Resp distress / pulm edema - due to vol overload,  losing body wt. Was here recently w/ same thing and dry wt was lowered. Doing better after emergent HD Sat night- on HD again Monday on schedule.  I think is such a small lady gets into trouble with relatively low volume on - also now with lower EF- ischemia could be contributing- see below 2  [facility] MWF HD- due tomorrow.  Had 1st stage BVT done on 07/21/16, -  steal sxms- s/p avf ligation- I agree with likely be [facility]  dependent 3  Liver transplant - on prograf 2mg  bid 4  CABG '15 / ICM EF 45% / sp St Jude AVR - on coumadin- EF lower- 30%- cards considering cardiac cath- will coordinate with them if to be done tomorrow  5  PVD bilat LE amp 6. Bones- phoslo and calcitriol  7. Anemia- hgb over 11- no ESA needed at this time  Lashonda Sonneborn A  08/12/2016, 9:11 AM    Recent Labs Lab 08/09/16 2251 08/10/16 1459 08/11/16 0026  NA 133* 129* 132*  K 5.7* 6.5* 4.1  CL 93* 92* 96*  CO2 29 25 26   GLUCOSE 153* 155* 109*  BUN 18 14 20   CREATININE 4.33* 3.32* 3.89*  CALCIUM 8.7* 9.1 8.5*  PHOS 6.7*  --   --     Recent Labs Lab 08/09/16 2044 08/09/16 2251  AST 62*  --   ALT 31  --   ALKPHOS 294*  --   BILITOT 1.8*  --   PROT 7.8  --   ALBUMIN 1.8* 1.8*  Recent Labs Lab 08/10/16 0745 08/11/16 0026 08/12/16 0339  WBC 8.4 7.5 5.6  NEUTROABS 5.7  --   --   HGB 14.2 12.5 11.5*  HCT 43.1 37.4 36.1  MCV 89.4 89.3 89.1  PLT 125* 168 148*   Iron/TIBC/Ferritin/ %Sat    Component Value Date/Time   IRON 22 (L) 03/12/2016 1308   TIBC 181 (L) 03/12/2016 1308   FERRITIN 1,095 (H) 08/30/2012 1449   IRONPCTSAT 12 03/12/2016 1308

## 2016-08-12 NOTE — Progress Notes (Signed)
Site area: rt groin fa sheath Site Prior to Removal:  Level 0 Pressure Applied For: 20 minutes Manual:   yes Patient Status During Pull:  stable Post Pull Site:  Level 0 Post Pull Instructions Given:  yes Post Pull Pulses Present: bilateral BKA Dressing Applied:  Gauze and tegaderm Bedrest begins @ 1840 Comments:

## 2016-08-12 NOTE — Progress Notes (Signed)
ANTICOAGULATION CONSULT NOTE - Follow Up Consult  Pharmacy Consult for Heparin and Coumadin Indication: mechanical AVR  Allergies  Allergen Reactions  . Acetaminophen Other (See Comments)    Liver transplant recipient   . Codeine Itching  . Mirtazapine Other (See Comments)    hallucination    Patient Measurements: Height: 5\' 4"  (162.6 cm) (prior to bilat BKA) Weight: 103 lb 13.4 oz (47.1 kg) IBW/kg (Calculated) : 54.7 Heparin Dosing Weight: 47 kg  Vital Signs: Temp: 98 F (36.7 C) (07/03 1858) Temp Source: Oral (07/03 1858) BP: 144/64 (07/03 1858) Pulse Rate: 68 (07/03 1835)  Labs:  Recent Labs  08/09/16 2044  08/10/16 0415  08/10/16 0745 08/10/16 1459 08/11/16 0026 08/12/16 0339 08/12/16 1536  HGB 12.4  --   --   --  14.2  --  12.5 11.5*  --   HCT 37.5  --   --   --  43.1  --  37.4 36.1  --   PLT 179  --   --   --  125*  --  168 148*  --   LABPROT  --   --   --   < > 28.2*  --  27.9* 22.7* 21.8*  INR  --   --   --   < > 2.58  --  2.55 1.97 1.87  CREATININE 4.35*  < >  --   --   --  3.32* 3.89*  --  3.55*  TROPONINI 0.48*  --  0.47*  --  0.44*  --   --   --   --   < > = values in this interval not displayed.  Estimated Creatinine Clearance: 12.4 mL/min (A) (by C-G formula based on SCr of 3.55 mg/dL (H)).   Medications:  Scheduled:  . aspirin  81 mg Oral Daily  . calcitRIOL  0.5 mcg Oral Q M,W,F-HD  . calcium acetate  667 mg Oral TID WC  . carvedilol  3.125 mg Oral BID WC  . feeding supplement  1 Container Oral TID BM  . gabapentin  300 mg Oral QHS  . levothyroxine  175 mcg Oral QAC breakfast  . metoCLOPramide  5 mg Oral BID AC  . polyethylene glycol  17 g Oral BID  . sodium chloride flush  3 mL Intravenous Q12H  . tacrolimus  2 mg Oral BID  . traMADol  50 mg Oral QID   Infusions:  . sodium chloride 10 mL/hr at 08/11/16 1603  . sodium chloride    . dextrose 25 mL/hr at 08/10/16 2349    Assessment: 61 yo F s/p cath with plans for medical  management.  Pt on Coumadin PTA.  To restart heparin 8hr after sheath pull and bridge until therapeutic INR.  Sheath pulled at [place].  INR 1.87  Goal of Therapy:  INR 2.5-3.5 Heparin level 0.3-0.7 units/ml Monitor platelets by anticoagulation protocol: Yes   Plan:  Coumadin 2.5mg  PO x 1 tonight. Heparin at 600 units/hr - start 7/4 at 0240 Heparin level 8 hours after start (1100 7/4) Heparin level, CBC, and INR daily  [employer], Pharm.D., BCPS Clinical Pharmacist Pager: [phone] 08/12/2016 7:20 PM

## 2016-08-13 DIAGNOSIS — I5021 Acute systolic (congestive) heart failure: Secondary | ICD-10-CM

## 2016-08-13 DIAGNOSIS — E43 Unspecified severe protein-calorie malnutrition: Secondary | ICD-10-CM | POA: Insufficient documentation

## 2016-08-13 DIAGNOSIS — R0602 Shortness of breath: Secondary | ICD-10-CM

## 2016-08-13 LAB — RENAL FUNCTION PANEL
ANION GAP: 11 (ref 5–15)
Albumin: 1.9 g/dL — ABNORMAL LOW (ref 3.5–5.0)
BUN: 21 mg/dL — AB (ref 6–20)
CALCIUM: 8.3 mg/dL — AB (ref 8.9–10.3)
CO2: 24 mmol/L (ref 22–32)
Chloride: 97 mmol/L — ABNORMAL LOW (ref 101–111)
Creatinine, Ser: 4.88 mg/dL — ABNORMAL HIGH (ref 0.44–1.00)
GFR calc Af Amer: 10 mL/min — ABNORMAL LOW (ref >60)
GFR calc non Af Amer: 9 mL/min — ABNORMAL LOW (ref >60)
GLUCOSE: 117 mg/dL — AB (ref 65–99)
Phosphorus: 7.3 mg/dL — ABNORMAL HIGH (ref 2.5–4.6)
Potassium: 4.4 mmol/L (ref 3.5–5.1)
SODIUM: 132 mmol/L — AB (ref 135–145)

## 2016-08-13 LAB — PROTIME-INR
INR: 1.95
PROTHROMBIN TIME: 22.6 s — AB (ref 11.4–15.2)

## 2016-08-13 LAB — CBC
HCT: 39.7 % (ref 36.0–46.0)
Hemoglobin: 13.1 g/dL (ref 12.0–15.0)
MCH: 29.5 pg (ref 26.0–34.0)
MCHC: 33 g/dL (ref 30.0–36.0)
MCV: 89.4 fL (ref 78.0–100.0)
PLATELETS: 130 10*3/uL — AB (ref 150–400)
RBC: 4.44 MIL/uL (ref 3.87–5.11)
RDW: 16.6 % — ABNORMAL HIGH (ref 11.5–15.5)
WBC: 5.6 10*3/uL (ref 4.0–10.5)

## 2016-08-13 LAB — HEPARIN LEVEL (UNFRACTIONATED): Heparin Unfractionated: 0.37 IU/mL (ref 0.30–0.70)

## 2016-08-13 LAB — GLUCOSE, CAPILLARY
Glucose-Capillary: 77 mg/dL (ref 65–99)
Glucose-Capillary: 130 mg/dL — ABNORMAL HIGH (ref 65–99)

## 2016-08-13 MED ORDER — WARFARIN SODIUM 2.5 MG PO TABS
2.5000 mg | ORAL_TABLET | Freq: Once | ORAL | Status: AC
  Administered 2016-08-13: 2.5 mg via ORAL
  Filled 2016-08-13: qty 1

## 2016-08-13 MED ORDER — ALBUMIN HUMAN 25 % IV SOLN
INTRAVENOUS | Status: AC
  Filled 2016-08-13: qty 100

## 2016-08-13 MED ORDER — ALBUMIN HUMAN 25 % IV SOLN
25.0000 g | Freq: Once | INTRAVENOUS | Status: AC
  Administered 2016-08-13: 25 g via INTRAVENOUS

## 2016-08-13 MED ORDER — ONDANSETRON 4 MG PO TBDP: 4.0000 mg | ORAL_TABLET | Freq: Three times a day (TID) | ORAL | 0 refills | Status: DC | PRN

## 2016-08-13 MED ORDER — CARVEDILOL 3.125 MG PO TABS: 3.1250 mg | ORAL_TABLET | Freq: Two times a day (BID) | ORAL | 3 refills | Status: DC

## 2016-08-13 NOTE — Progress Notes (Signed)
[facility] Progress Note  Subjective: Had cardiac cath- CTO of OM2 and OM3 as well as80-90% lesions in rPDA andrPL2- recommending medical management.  Symptomatically improved.  INR 1.87. Due for HD later today   Vitals:   08/13/16 0444 08/13/16 0700 08/13/16 0845 08/13/16 1057  BP: (!) 102/52 122/65    Pulse: 70 70    Resp: 17 18 (!) 7 13  Temp: 97.9 F (36.6 C) 98.1 F (36.7 C)    TempSrc: Oral Oral    SpO2: 100% 98%    Weight: 48.2 kg (106 lb 4.8 oz)     Height:        Inpatient medications: . aspirin  81 mg Oral Daily  . calcitRIOL  0.5 mcg Oral Q M,W,F-HD  . calcium acetate  667 mg Oral TID WC  . carvedilol  3.125 mg Oral BID WC  . feeding supplement  1 Container Oral TID BM  . gabapentin  300 mg Oral QHS  . levothyroxine  175 mcg Oral QAC breakfast  . metoCLOPramide  5 mg Oral BID AC  . polyethylene glycol  17 g Oral BID  . sodium chloride flush  3 mL Intravenous Q12H  . tacrolimus  2 mg Oral BID  . traMADol  50 mg Oral QID  . Warfarin - Pharmacist Dosing Inpatient   Does not apply q1800   . sodium chloride 10 mL/hr at 08/13/16 0800  . sodium chloride    . dextrose 25 mL/hr at 08/10/16 2349  . heparin 600 Units/hr (08/13/16 0800)   sodium chloride, diphenhydrAMINE, methocarbamol, morphine injection, nitroGLYCERIN, ondansetron (ZOFRAN) IV, ondansetron, sodium chloride flush  Exam: NAD, alert, nasal O2 No jvd Chest dec'd at bases, o/w clear RRR 2/6 SEM Abd soft ntnd no ascites Ext bilat LE amp, no edema NF, Ox 3 R IJ TDC  Dialysis: [name] MWF 3.5h  R IJ Cath   3K/2.25 bath 48kg  Hep none - Calcitriol 0.5 ug       Assess: 1  Resp distress / pulm edema - due to vol overload,  losing body wt. Was here recently w/ same thing and dry wt was lowered. Doing better after emergent HD Sat night- on HD again Monday on schedule.  I think is such a small lady gets into trouble with relatively low volume on - also now with lower EF- ischemia could be  contributing- see below 2  ESRD MWF HD- due today.  Had 1st stage BVT done on 07/21/16, -  steal sxms- s/p avf ligation- I agree with likely be [facility] dependent 3  Liver transplant - on prograf 2mg  bid 4  CABG '15 / ICM EF 45% / sp St Jude AVR - on coumadin- EF lower- 30%- cards did cardiac cath- diffuse disease, medical management 5  PVD bilat LE amp 6. Bones- phoslo and calcitriol  7. Anemia- hgb over 11- no ESA needed at this time 8. Dispo- possibly soon ? INR 1.87 likely needs to be over 2 prior to discharge also needs HD later today.  Possible discharge tomorrow ?   [name] A  08/13/2016, 11:14 AM    Recent Labs Lab 08/09/16 2251 08/10/16 1459 08/11/16 0026 08/12/16 1536  NA 133* 129* 132* 130*  K 5.7* 6.5* 4.1 5.1  CL 93* 92* 96* 96*  CO2 29 25 26  21*  GLUCOSE 153* 155* 109* 83  BUN 18 14 20 16   CREATININE 4.33* 3.32* 3.89* 3.55*  CALCIUM 8.7* 9.1 8.5* 9.0  PHOS 6.7*  --   --   --  Recent Labs Lab 08/09/16 2044 08/09/16 2251  AST 62*  --   ALT 31  --   ALKPHOS 294*  --   BILITOT 1.8*  --   PROT 7.8  --   ALBUMIN 1.8* 1.8*    Recent Labs Lab 08/10/16 0745 08/11/16 0026 08/12/16 0339  WBC 8.4 7.5 5.6  NEUTROABS 5.7  --   --   HGB 14.2 12.5 11.5*  HCT 43.1 37.4 36.1  MCV 89.4 89.3 89.1  PLT 125* 168 148*   Iron/TIBC/Ferritin/ %Sat    Component Value Date/Time   IRON 22 (L) 03/12/2016 1308   TIBC 181 (L) 03/12/2016 1308   FERRITIN 1,095 (H) 08/30/2012 1449   IRONPCTSAT 12 03/12/2016 1308

## 2016-08-13 NOTE — Progress Notes (Signed)
Per Dr. Jacalyn Lefevre request, I have sent a message to our scheduler requesting a follow-up appointment, and our office will call the patient with this information - f/u with Dr. [name] in 4-6 weeks and Coumadin clinic on Monday.  Kameran Mcneese PA-C

## 2016-08-13 NOTE — Progress Notes (Signed)
Progress Note  Patient Name: [name] Date of Encounter: 08/13/2016  Primary Cardiologist: Dr [name]   Subjective   Pt has had no further CP; no dyspnea  Inpatient Medications    Scheduled Meds: . aspirin  81 mg Oral Daily  . calcitRIOL  0.5 mcg Oral Q M,W,F-HD  . calcium acetate  667 mg Oral TID WC  . carvedilol  3.125 mg Oral BID WC  . feeding supplement  1 Container Oral TID BM  . gabapentin  300 mg Oral QHS  . levothyroxine  175 mcg Oral QAC breakfast  . metoCLOPramide  5 mg Oral BID AC  . polyethylene glycol  17 g Oral BID  . sodium chloride flush  3 mL Intravenous Q12H  . tacrolimus  2 mg Oral BID  . traMADol  50 mg Oral QID  . Warfarin - Pharmacist Dosing Inpatient   Does not apply q1800   Continuous Infusions: . sodium chloride 10 mL/hr at 08/11/16 1603  . sodium chloride    . dextrose 25 mL/hr at 08/10/16 2349  . heparin 600 Units/hr (08/13/16 0243)   PRN Meds: sodium chloride, diphenhydrAMINE, methocarbamol, morphine injection, nitroGLYCERIN, ondansetron (ZOFRAN) IV, ondansetron, sodium chloride flush   Vital Signs    Vitals:   08/12/16 2129 08/12/16 2159 08/13/16 0003 08/13/16 0444  BP: (!) 105/49 (!) 122/58 (!) 93/52 (!) 102/52  Pulse:   67 70  Resp: 17 17 15 17   Temp:   97.8 F (36.6 C) 97.9 F (36.6 C)  TempSrc:   Oral Oral  SpO2:   100% 100%  Weight:    48.2 kg (106 lb 4.8 oz)  Height:        Intake/Output Summary (Last 24 hours) at 08/13/16 0740 Last data filed at 08/13/16 0533  Gross per 24 hour  Intake          1339.24 ml  Output                0 ml  Net          1339.24 ml   Filed Weights   08/11/16 0706 08/11/16 1036 08/13/16 0444  Weight: 47.6 kg (104 lb 15 oz) 47.1 kg (103 lb 13.4 oz) 48.2 kg (106 lb 4.8 oz)    Physical Exam   GEN: WD, frail, chronically ill appearing in no acute distress.   Neck: No JVD Cardiac: RRR, mechanical valve; no new murmur Respiratory: CTA GI: Soft, nontender, non-distended MS: s/p  Bilateral BKA; right groin with no hematoma and no bruit Neuro:  No focal findings   Labs    Chemistry  Recent Labs Lab 08/09/16 2044 08/09/16 2251 08/10/16 1459 08/11/16 0026 08/12/16 1536  NA 133* 133* 129* 132* 130*  K 5.7* 5.7* 6.5* 4.1 5.1  CL 95* 93* 92* 96* 96*  CO2 27 29 25 26  21*  GLUCOSE 158* 153* 155* 109* 83  BUN 17 18 14 20 16   CREATININE 4.35* 4.33* 3.32* 3.89* 3.55*  CALCIUM 8.8* 8.7* 9.1 8.5* 9.0  PROT 7.8  --   --   --   --   ALBUMIN 1.8* 1.8*  --   --   --   AST 62*  --   --   --   --   ALT 31  --   --   --   --   ALKPHOS 294*  --   --   --   --   BILITOT 1.8*  --   --   --   --  GFRNONAA 10* 10* 14* 12* 13*  GFRAA 12* 12* 16* 13* 15*  ANIONGAP 11 11 12 10 13      Hematology  Recent Labs Lab 08/10/16 0745 08/11/16 0026 08/12/16 0339  WBC 8.4 7.5 5.6  RBC 4.82 4.19 4.05  HGB 14.2 12.5 11.5*  HCT 43.1 37.4 36.1  MCV 89.4 89.3 89.1  MCH 29.5 29.8 28.4  MCHC 32.9 33.4 31.9  RDW 17.0* 16.7* 16.5*  PLT 125* 168 148*    Cardiac Enzymes  Recent Labs Lab 08/09/16 2044 08/10/16 0415 08/10/16 0745  TROPONINI 0.48* 0.47* 0.44*    BNP  Recent Labs Lab 08/09/16 1930  BNP >4,500.0*      Patient Profile     61 y.o. female with past medical history of coronary artery disease status post coronary artery bypass graft, mechanical aortic valve replacement, end-stage renal disease dialysis dependent, prior liver transplant admitted with acute dyspnea and pleuritic chest pain. Troponin minimally elevated. Cardiology asked to evaluate.  Assessment & Plan    1 elevated troponin-Patient ruled in for a non-ST elevation myocardial infarction. Cardiac catheterization results noted. Pt has new CTO of OM2 & OM3 as well as ostial-proximal 80-90% lesions in both the rPDA & rPL2. Per Dr Ellyn Hack and Dr Irish Lack, medical therapy best option. Continue aspirin and low-dose carvedilol. Her blood pressure is borderline and she is dialysis dependent and therefore  will be difficult to advance cardiac medications. Would review with hepatologist prior to initiating statin given h/o liver transplant.  2 acute on chronic combined systolic/diastolic congestive heart failure-volume status much improved. This will be managed by dialysis.  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 Catheterization results noted. Plan medical therapy. Continue low-dose carvedilol. Her blood pressure will not allow an ARB or hydralazine/nitrates. Given end-stage renal disease and dialysis dependence she would not be a candidate for ICD.  4 end-stage renal disease-dialysis per nephrology.  4 Aortic valve replacement-resume home coumadin dose; fu coumadin clinic Monday 7/9; continue ASA.  5 prior liver transplant   Patient can be discharged from a cardiac standpoint. She will need follow-up with Dr. [name] in 4-6 weeks. Please call with questions.  Signed, Kirk Ruths, MD  08/13/2016, 7:40 AM

## 2016-08-13 NOTE — Progress Notes (Signed)
ANTICOAGULATION CONSULT NOTE - Follow Up Consult  Pharmacy Consult for Heparin (while INR is sub-therapeutic) Indication: mechanical AVR  Allergies  Allergen Reactions  . Acetaminophen Other (See Comments)    Liver transplant recipient   . Codeine Itching  . Mirtazapine Other (See Comments)    hallucination   Patient Measurements: Height: 5\' 4"  (162.6 cm) (prior to bilat BKA) Weight: 101 lb 13.6 oz (46.2 kg) IBW/kg (Calculated) : 54.7  Vital Signs: Temp: 98.5 F (36.9 C) (07/04 2023) Temp Source: Oral (07/04 2023) BP: 102/57 (07/04 2023) Pulse Rate: 71 (07/04 2023)  Labs:  Recent Labs  08/11/16 0026 08/12/16 0339 08/12/16 1536 08/13/16 1111 08/13/16 2057  HGB 12.5 11.5*  --  13.1  --   HCT 37.4 36.1  --  39.7  --   PLT 168 148*  --  130*  --   LABPROT 27.9* 22.7* 21.8* 22.6*  --   INR 2.55 1.97 1.87 1.95  --   HEPARINUNFRC  --   --   --  <0.10* 0.37  CREATININE 3.89*  --  3.55* 4.88*  --     Estimated Creatinine Clearance: 8.8 mL/min (A) (by C-G formula based on SCr of 4.88 mg/dL (H)).   Assessment: Pt s/p cath with plans for medical management, on heparin bridge while INR is sub-therapeutic, heparin level therapeutic x 1 after rate increase  Goal of Therapy:  Heparin level 0.3-0.7 units/ml Monitor platelets by anticoagulation protocol: Yes   Plan:  -Cont heparin at 800 units/hr -Confirmatory HL with AM labs  Narda Bonds 08/13/2016,10:18 PM

## 2016-08-13 NOTE — Care Management Important Message (Signed)
Important Message  Patient Details  Name: [name] MRN: [id number] Date of Birth: [birth year]   Medicare Important Message Given:  Yes    Val Farnam Abena 08/13/2016, 9:30 AM

## 2016-08-13 NOTE — Progress Notes (Addendum)
[facility]                                                                              Patient Demographics  Sarrah Fiorenza, is a 61 y.o. female, DOB - 15-Apr-1955, ION:[id number]  Admit date - 08/09/2016   Admitting Physician Etta Quill, DO  Outpatient Primary MD for the patient is System, Pcp Not In  Outpatient specialists:   LOS - 4  days   Medical records reviewed and are as summarized below:    No chief complaint on file.      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 Patient was found to be hypoxic with mild elevated troponin and some EKG changed and was transferred to [facility] for further evaluation. Patient was found to be on pulmonary edema was placed on BiPAP and had emergent  Dialysis. Cardiac work up revealed EKG changes and ECHO with new ischemic changes. Cardiology was consulted. Patient underwent cardiac catheterization on 7/4, medical management was recommended. During hemodialysis on 7/2 she had symptoms of steal syndrome during dialysis and underwent AVF ligation.    Assessment & Plan    Acute on chronic hypoxic respiratory failure - Improved, likely due to pulmonary edema, progressive CHF - Patient initially presented with substernal chest pain, dyspnea, palpitations. She was recently discharged on 6/23 after admitted with fluid overload. She had emergent dialysis on 6/30.  - 2-D echo showed lateral wall akinesis of the mid/distal inferior septal wall - Patient underwent cardiac cath. 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 The CTO vessels and likely not acute, the one potential PCI target would be rPDA, but with distal RCA calcified lesions this will be  complicated, and medical management was recommended. - Wean O2 as tolerated  Acute on chronic combined systolic and diastolic CHF, elevated troponins - Cardiology was consulted, history of CABG. - Patient underwent cardiac cath on 7/3, report as above. 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 once INR therapeutic  Time Spent in minutes   25 minutes  Procedures:  HD  Consultants:   Nephrology Vascular surgery Cardiology  Antimicrobials:      Medications  Scheduled Meds: . aspirin  81 mg Oral Daily  . calcitRIOL  0.5 mcg Oral Q M,W,F-HD  . calcium acetate  667 mg Oral TID WC  . carvedilol  3.125 mg Oral BID WC  . feeding supplement  1 Container Oral TID BM  . gabapentin  300 mg Oral QHS  . levothyroxine  175 mcg Oral QAC breakfast  . metoCLOPramide  5 mg Oral BID AC  . polyethylene glycol  17 g Oral BID  . sodium chloride flush  3 mL Intravenous Q12H  . tacrolimus  2 mg Oral BID  . traMADol  50 mg Oral QID  . warfarin  2.5 mg Oral ONCE-1800  . Warfarin - Pharmacist Dosing Inpatient   Does  not apply q1800   Continuous Infusions: . sodium chloride 10 mL/hr at 08/13/16 0800  . sodium chloride    . dextrose 25 mL/hr at 08/10/16 2349  . heparin 800 Units/hr (08/13/16 1225)   PRN Meds:.sodium chloride, diphenhydrAMINE, methocarbamol, morphine injection, nitroGLYCERIN, ondansetron (ZOFRAN) IV, ondansetron, sodium chloride flush   Antibiotics   Anti-infectives    None        Subjective:   Kandie Keiper was seen and examined today. Wants to go home today. Denies any specific complaints. Patient denies dizziness, chest pain, shortness of breath, abdominal pain, N/V/D/C, new weakness, numbess, tingling. No acute events overnight.    Objective:   Vitals:   08/13/16 1311 08/13/16 1320 08/13/16 1325 08/13/16 1400  BP: (!) 111/49 (!) 109/56 (!) 114/46 (!) 92/38  Pulse: 73 67 68 65  Resp:  17 15 16   Temp: 98 F (36.7 C)     TempSrc: Oral     SpO2: 95%     Weight: 48.2 kg (106 lb 4.2 oz)     Height:        Intake/Output Summary (Last 24 hours) at 08/13/16 1432 Last data filed at 08/13/16 1300  Gross per 24 hour  Intake          1459.24 ml  Output                0 ml  Net          1459.24 ml     Wt Readings from Last 3 Encounters:  08/13/16 48.2 kg (106 lb 4.2 oz)  08/06/16 45.8 kg (101 lb)  08/02/16 48.9 kg (107 lb 12.9 oz)     Exam  General: Alert and oriented x 3, NAD  Eyes: PERRLA, EOMI, Anicteric Sclera,  HEENT:  Atraumatic, normocephalic, normal oropharynx  Cardiovascular: S1 S2 auscultated, no rubs, murmurs or gallops. Regular rate and rhythm.  Respiratory: Clear to auscultation bilaterally, no wheezing, rales or rhonchi  Gastrointestinal: Soft, nontender, nondistended, + bowel sounds  Ext: Bilateral BKA  Neuro: Nonfocal  Musculoskeletal: No digital cyanosis, clubbing  Skin: No rashes  Psych: Normal affect and demeanor, alert and oriented x3    Data Reviewed:  I have personally reviewed following labs and imaging studies  Micro  Results Recent Results (from the past 240 hour(s))  MRSA PCR Screening     Status: None   Collection Time: 08/09/16  5:39 PM  Result Value Ref Range Status   MRSA by PCR NEGATIVE NEGATIVE Final    Comment:        The GeneXpert MRSA Assay (FDA approved for NASAL specimens only), is one component of a comprehensive MRSA colonization  surveillance program. It is not intended to diagnose MRSA infection nor to guide or monitor treatment for MRSA infections.     Radiology Reports Dg Chest 2 View  Result Date: 07/19/2016 CLINICAL DATA:  Shortness of breath. EXAM: CHEST  2 VIEW COMPARISON:  07/17/2016 and prior radiographs FINDINGS: Cardiomegaly, aortic valve replacement and right IJ central venous catheter with tip overlying the superior cavoatrial junction again noted. A moderate to large right pleural effusion appears slightly increased in size. A small left pleural effusion is again noted. Mild interstitial opacities are again identified likely representing mild interstitial edema. Bilateral lower lung atelectasis again noted. There is no evidence of pneumothorax. IMPRESSION: Moderate to large right pleural effusion which appears slightly increased since 07/17/2016. No other significant changes. Small left pleural effusion, probable mild interstitial pulmonary edema and bilateral lower lobe atelectasis again noted. Electronically Signed   By: Margarette Canada M.D.   On: 07/19/2016 10:30   Dg Chest 2 View  Result Date: 07/17/2016 CLINICAL DATA:  Shortness of breath. History of liver transplant, coronary artery disease, valvular heart disease, dialysis dependent renal failure. EXAM: CHEST  2 VIEW COMPARISON:  Chest x-ray of May 28, 2016 FINDINGS: The left lung is adequately inflated. On the right there is mild volume loss slightly more conspicuous today. A moderate size right pleural effusion is present and has increased slightly since the previous study. The heart is mildly enlarged. There is a  prosthetic aortic valve. The sternal wires are intact. There is calcification in the wall of the aortic arch. The central pulmonary vascularity is prominent. The dialysis catheter tip projects at the cavoatrial junction. IMPRESSION: Slight interval increase in the size of the right pleural effusion such that it is moderate in size. A small left pleural effusion is stable. There is mild interstitial prominence bilaterally compatible with edema. Stable mild cardiomegaly with central pulmonary vascular congestion. Electronically Signed   By: David  Martinique M.D.   On: 07/17/2016 10:10   Portable Chest 1 View  Result Date: 08/09/2016 CLINICAL DATA:  Shortness of breath. EXAM: PORTABLE CHEST 1 VIEW COMPARISON:  August 08, 2016 FINDINGS: Stable PermCath. No pneumothorax. The cardiomediastinal silhouette is unchanged. There is a tiny left effusion with underlying atelectasis. There is a moderate right effusion which is larger in the interval. Probable asymmetric edema, right greater than left. IMPRESSION: Bilateral pleural effusions, right greater than left with probable asymmetric edema, right greater than left. The findings have worsened in the interval. Electronically Signed   By: Dorise Bullion III M.D   On: 08/09/2016 18:55    Lab Data:  CBC:  Recent Labs Lab 08/09/16 2044 08/10/16 0745 08/11/16 0026 08/12/16 0339 08/13/16 1111  WBC 10.3 8.4 7.5 5.6 5.6  NEUTROABS  --  5.7  --   --   --   HGB 12.4 14.2 12.5 11.5* 13.1  HCT 37.5 43.1 37.4 36.1 39.7  MCV 88.9 89.4 89.3 89.1 89.4  PLT 179 125* 168 148* 032*   Basic Metabolic Panel:  Recent Labs Lab 08/09/16 2251 08/10/16 1459 08/11/16 0026 08/12/16 1536 08/13/16 1111  NA 133* 129* 132* 130* 132*  K 5.7* 6.5* 4.1 5.1 4.4  CL 93* 92* 96* 96* 97*  CO2 29 25 26  21* 24  GLUCOSE 153* 155* 109* 83 117*  BUN 18 14 20 16  21*  CREATININE 4.33* 3.32* 3.89* 3.55* 4.88*  CALCIUM 8.7* 9.1 8.5* 9.0 8.3*  PHOS 6.7*  --   --   --  7.3*  GFR: Estimated Creatinine Clearance: 9.2 mL/min (A) (by C-G formula based on SCr of 4.88 mg/dL (H)). Liver Function Tests:  Recent Labs Lab 08/09/16 2044 08/09/16 2251 08/13/16 1111  AST 62*  --   --   ALT 31  --   --   ALKPHOS 294*  --   --   BILITOT 1.8*  --   --   PROT 7.8  --   --   ALBUMIN 1.8* 1.8* 1.9*   No results for input(s): LIPASE, AMYLASE in the last 168 hours. No results for input(s): AMMONIA in the last 168 hours. Coagulation Profile:  Recent Labs Lab 08/10/16 0745 08/11/16 0026 08/12/16 0339 08/12/16 1536 08/13/16 1111  INR 2.58 2.55 1.97 1.87 1.95   Cardiac Enzymes:  Recent Labs Lab 08/09/16 2044 08/10/16 0415 08/10/16 0745  TROPONINI 0.48* 0.47* 0.44*   BNP (last 3 results) No results for input(s): PROBNP in the last 8760 hours. HbA1C: No results for input(s): HGBA1C in the last 72 hours. CBG:  Recent Labs Lab 08/11/16 1748 08/12/16 1815 08/12/16 2305 08/13/16 0758 08/13/16 1208  GLUCAP 81 77 183* 77 130*   Lipid Profile: No results for input(s): CHOL, HDL, LDLCALC, TRIG, CHOLHDL, LDLDIRECT in the last 72 hours. Thyroid Function Tests: No results for input(s): TSH, T4TOTAL, FREET4, T3FREE, THYROIDAB in the last 72 hours. Anemia Panel: No results for input(s): VITAMINB12, FOLATE, FERRITIN, TIBC, IRON, RETICCTPCT in the last 72 hours. Urine analysis:    Component Value Date/Time   COLORURINE YELLOW 11/02/2012 0428   APPEARANCEUR CLOUDY (A) 11/02/2012 0428   LABSPEC 1.011 11/02/2012 0428   PHURINE 8.0 11/02/2012 0428   GLUCOSEU 250 (A) 11/02/2012 0428   HGBUR LARGE (A) 11/02/2012 0428   BILIRUBINUR NEGATIVE 11/02/2012 0428   KETONESUR NEGATIVE 11/02/2012 0428   PROTEINUR >300 (A) 11/02/2012 0428   UROBILINOGEN 0.2 11/02/2012 0428   NITRITE NEGATIVE 11/02/2012 0428   LEUKOCYTESUR NEGATIVE 11/02/2012 0428     Ripudeep Rai M.D. [facility] 08/13/2016, 2:32 PM  Pager: [phone] Between 7am to 7pm - call Pager -  [phone]  After 7pm go to [email or web address] - password [email or web address]  Call night coverage person covering after 7pm

## 2016-08-13 NOTE — Progress Notes (Signed)
ANTICOAGULATION CONSULT NOTE - Follow Up Consult  Pharmacy Consult for Heparin and Coumadin Indication: mechanical AVR  Allergies  Allergen Reactions  . Acetaminophen Other (See Comments)    Liver transplant recipient   . Codeine Itching  . Mirtazapine Other (See Comments)    hallucination    Patient Measurements: Height: 5\' 4"  (162.6 cm) (prior to bilat BKA) Weight: 106 lb 4.8 oz (48.2 kg) IBW/kg (Calculated) : 54.7 Heparin Dosing Weight: 47 kg  Vital Signs: Temp: 98.1 F (36.7 C) (07/04 0700) Temp Source: Oral (07/04 0700) BP: 122/65 (07/04 0700) Pulse Rate: 70 (07/04 0700)  Labs:  Recent Labs  08/11/16 0026 08/12/16 0339 08/12/16 1536 08/13/16 1111  HGB 12.5 11.5*  --  13.1  HCT 37.4 36.1  --  39.7  PLT 168 148*  --  PENDING  LABPROT 27.9* 22.7* 21.8* 22.6*  INR 2.55 1.97 1.87 1.95  HEPARINUNFRC  --   --   --  <0.10*  CREATININE 3.89*  --  3.55* 4.88*    Estimated Creatinine Clearance: 9.2 mL/min (A) (by C-G formula based on SCr of 4.88 mg/dL (H)).   Medications:  Scheduled:  . aspirin  81 mg Oral Daily  . calcitRIOL  0.5 mcg Oral Q M,W,F-HD  . calcium acetate  667 mg Oral TID WC  . carvedilol  3.125 mg Oral BID WC  . feeding supplement  1 Container Oral TID BM  . gabapentin  300 mg Oral QHS  . levothyroxine  175 mcg Oral QAC breakfast  . metoCLOPramide  5 mg Oral BID AC  . polyethylene glycol  17 g Oral BID  . sodium chloride flush  3 mL Intravenous Q12H  . tacrolimus  2 mg Oral BID  . traMADol  50 mg Oral QID  . Warfarin - Pharmacist Dosing Inpatient   Does not apply q1800   Infusions:  . sodium chloride 10 mL/hr at 08/13/16 0800  . sodium chloride    . dextrose 25 mL/hr at 08/10/16 2349  . heparin 600 Units/hr (08/13/16 0800)    Assessment: 67 yoF s/p cath with plans for medical management. Pt on Coumadin PTA for Mechanical AVR (goal 2.5-3.5) to resume with heparin bridge. INR remains subtherapeutic but trending up to 1.95, heparin level  undetectable, Hgb wnl. No issues noted per RN, will avoid bolus given INR close to 2.0.  Home Coumadin Dose: 1.25 mg daily except 2.5 mg on T/T/Sun  Goal of Therapy:  INR 2.5-3.5 Heparin level 0.3-0.7 units/ml Monitor platelets by anticoagulation protocol: Yes   Plan:  -Coumadin 2.5mg  PO x 1 again tonight -Increase heparin to 800 units/hr -Check 8-hr heparin level -Monitor daily heparin level, CBC, INR, S/Sx bleeding  Arrie Senate, PharmD PGY-2 Cardiology Pharmacy Resident Pager: [phone] 08/13/2016

## 2016-08-13 NOTE — Procedures (Signed)
Patient was seen on dialysis and the procedure was supervised.  BFR 350  Via PC BP is  123/69.   Patient appears to be tolerating treatment well- some low BP- giving albumin   Argus Caraher A 08/13/2016

## 2016-08-13 NOTE — Progress Notes (Signed)
Initial Nutrition Assessment  DOCUMENTATION CODES:   Severe malnutrition in context of chronic illness  INTERVENTION:  Continue Boost Breeze po TID, each supplement provides 250 kcal and 9 grams of protein.  Encourage adequate PO intake.   NUTRITION DIAGNOSIS:   Malnutrition (severe) related to chronic illness (ESRD, CHF) as evidenced by severe depletion of body fat, severe depletion of muscle mass.  GOAL:   Patient will meet greater than or equal to 90% of their needs  MONITOR:   PO intake, Supplement acceptance, Labs, Weight trends, Skin, I & O's  REASON FOR ASSESSMENT:   Malnutrition Screening Tool    ASSESSMENT:   61 y.o. 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 Patient was found to be on pulmonary edema  EKG changes and ECHO with new ischemic changes  Pt underwent cardiac cath yesterday. Meal completion has been varied from 25-100%. Pt reports appetite has been improving. Pt reports consuming at least 3 meals a day at home PTA with additional consumption of Boost Breeze 2 times daily. Pt currently has Boost Breeze ordered and has been consuming them. RD to continue with current orders. Pt with weight loss since admission, however likely related to fluid status as pt fluid overloaded. Pt encouraged to eat her food at meals and to drink her supplements.   Nutrition-Focused physical exam completed. Findings are severe fat depletion, severe muscle depletion, and mild edema.   Labs and medications reviewed.   Diet Order:  Diet renal with fluid restriction Fluid restriction: 1200 mL Fluid; Room service appropriate? Yes; Fluid consistency: Thin  Skin:  Wound (see comment) (Stage III to coccyx)  Last BM:  6/30  Height:   Ht Readings from Last 1 Encounters:  08/09/16 5\' 4"  (1.626 m)    Weight:   Wt Readings from Last 1  Encounters:  08/13/16 106 lb 4.8 oz (48.2 kg)    Ideal Body Weight:  47.5 kg (adjusted for bilateral BKA)  BMI:  Body mass index is 18.25 kg/m.  Estimated Nutritional Needs:   Kcal:  1700-1900  Protein:  75-90 grams  Fluid:  1.2 L/day  EDUCATION NEEDS:   Education needs addressed  Corrin Parker, MS, RD, LDN Pager # [phone] After hours/ weekend pager # [phone]

## 2016-08-14 ENCOUNTER — Encounter (HOSPITAL_COMMUNITY): Payer: Self-pay | Admitting: Cardiology

## 2016-08-14 LAB — PREPARE FRESH FROZEN PLASMA
Unit division: 0
Unit division: 0

## 2016-08-14 LAB — CBC
HEMATOCRIT: 31.8 % — AB (ref 36.0–46.0)
HEMOGLOBIN: 10.5 g/dL — AB (ref 12.0–15.0)
MCH: 28.9 pg (ref 26.0–34.0)
MCHC: 33 g/dL (ref 30.0–36.0)
MCV: 87.6 fL (ref 78.0–100.0)
Platelets: 114 10*3/uL — ABNORMAL LOW (ref 150–400)
RBC: 3.63 MIL/uL — ABNORMAL LOW (ref 3.87–5.11)
RDW: 16.2 % — AB (ref 11.5–15.5)
WBC: 7.3 10*3/uL (ref 4.0–10.5)

## 2016-08-14 LAB — BPAM FFP
BLOOD PRODUCT EXPIRATION DATE: 201807072359
Blood Product Expiration Date: 201807052359
ISSUE DATE / TIME: 201807021338
UNIT TYPE AND RH: 6200
Unit Type and Rh: 6200

## 2016-08-14 LAB — PROTIME-INR
INR: 1.99
Prothrombin Time: 22.9 seconds — ABNORMAL HIGH (ref 11.4–15.2)

## 2016-08-14 LAB — HEPARIN LEVEL (UNFRACTIONATED): Heparin Unfractionated: 0.27 IU/mL — ABNORMAL LOW (ref 0.30–0.70)

## 2016-08-14 MED ORDER — ENOXAPARIN SODIUM 60 MG/0.6ML ~~LOC~~ SOLN
50.0000 mg | SUBCUTANEOUS | Status: DC
  Administered 2016-08-14: 09:00:00 50 mg via SUBCUTANEOUS
  Filled 2016-08-14: qty 0.6

## 2016-08-14 MED ORDER — WARFARIN SODIUM 2.5 MG PO TABS: 2.5000 mg | ORAL_TABLET | Freq: Once | ORAL | Status: DC

## 2016-08-14 MED ORDER — ENOXAPARIN SODIUM 60 MG/0.6ML ~~LOC~~ SOLN: 1.0000 mg/kg | SUBCUTANEOUS | 0 refills | Status: DC

## 2016-08-14 NOTE — Progress Notes (Signed)
[facility] Progress Note  Subjective:  Says going home with a Lovenox bridge "I've done it before" Says RN comes to her home, gets INR, calls to Dr. Evette Georges office for coumadin adjustment Had HD yesterday on schedule Post HD weight was 46.2 kg  Vitals:   08/14/16 0502 08/14/16 0737 08/14/16 0845 08/14/16 1104  BP: 101/62 (!) 110/51  (!) 124/54  Pulse: 77 70 72 68  Resp: 16 15 16 14   Temp: 98.3 F (36.8 C) 98.2 F (36.8 C)  98.1 F (36.7 C)  TempSrc: Oral Oral  Oral  SpO2: 100% 100% 100% 100%  Weight: 48.8 kg (107 lb 8 oz)     Height:       Exam: BP (!) 124/54 (BP Location: Right Arm)   Pulse 68   Temp 98.1 F (36.7 C) (Oral)   Resp 14   Ht 5\' 4"  (1.626 m) Comment: prior to bilat BKA  Wt 48.8 kg (107 lb 8 oz)   SpO2 100%   BMI 18.45 kg/m   NAD, alert, nasal O2 Lying in bed  "I'm going home" "be sure to get my dry weight right" No jvd Chest dec'd at bases, o/w clear Regular rhythm S1S2 with mechanical HV sounds No S3 Abd soft ntnd no ascites Ext bilat LE amp, no edema stumps R IJ [facility]   Inpatient medications: . aspirin  81 mg Oral Daily  . calcitRIOL  0.5 mcg Oral Q M,W,F-HD  . calcium acetate  667 mg Oral TID WC  . carvedilol  3.125 mg Oral BID WC  . enoxaparin (LOVENOX) injection  50 mg Subcutaneous Q24H  . feeding supplement  1 Container Oral TID BM  . gabapentin  300 mg Oral QHS  . levothyroxine  175 mcg Oral QAC breakfast  . metoCLOPramide  5 mg Oral BID AC  . polyethylene glycol  17 g Oral BID  . sodium chloride flush  3 mL Intravenous Q12H  . tacrolimus  2 mg Oral BID  . traMADol  50 mg Oral QID  . warfarin  2.5 mg Oral ONCE-1800  . Warfarin - Pharmacist Dosing Inpatient   Does not apply q1800   . sodium chloride 10 mL/hr at 08/13/16 0800  . sodium chloride    . dextrose 25 mL/hr at 08/10/16 2349   sodium chloride, diphenhydrAMINE, methocarbamol, morphine injection, nitroGLYCERIN, ondansetron (ZOFRAN) IV, ondansetron, sodium  chloride flush   Recent Labs Lab 08/09/16 2251  08/11/16 0026 08/12/16 1536 08/13/16 1111  NA 133*  < > 132* 130* 132*  K 5.7*  < > 4.1 5.1 4.4  CL 93*  < > 96* 96* 97*  CO2 29  < > 26 21* 24  GLUCOSE 153*  < > 109* 83 117*  BUN 18  < > 20 16 21*  CREATININE 4.33*  < > 3.89* 3.55* 4.88*  CALCIUM 8.7*  < > 8.5* 9.0 8.3*  PHOS 6.7*  --   --   --  7.3*  < > = values in this interval not displayed.  Recent Labs Lab 08/09/16 2044 08/09/16 2251 08/13/16 1111  AST 62*  --   --   ALT 31  --   --   ALKPHOS 294*  --   --   BILITOT 1.8*  --   --   PROT 7.8  --   --   ALBUMIN 1.8* 1.8* 1.9*    Recent Labs Lab 08/10/16 0745  08/12/16 0339 08/13/16 1111 08/14/16 0632  WBC 8.4  < >  5.6 5.6 7.3  NEUTROABS 5.7  --   --   --   --   HGB 14.2  < > 11.5* 13.1 10.5*  HCT 43.1  < > 36.1 39.7 31.8*  MCV 89.4  < > 89.1 89.4 87.6  PLT 125*  < > 148* 130* 114*  < > = values in this interval not displayed.  Dialysis:  [name] MWF 3.5h   R IJ Cath    3K/2.25 bath  48kg  Post HD weight 46.2 yesterday so new EDW ~46.5 kg Hep none Calcitriol 0.5 ug   Assess:  1. Resp distress / pulm edema - due to vol overload,  losing body wt. Was here recently w/ same thing and dry wt was lowered. Lower EDW again as above 2. ESRD MWF HD- had HD yesterday. Orders written for tomorrow in case still here. 3. Access issues  Had 1st stage BVT done on 07/21/16-->steal--> s/p avf ligation. Will likely be TDC dependent 4. Liver transplant - on prograf 2mg  bid 5. CABG '15 / ICM EF 45% / sp [employer] AVR - on coumadin- EF lower- 30%- cards did cardiac cath- diffuse disease, medical management. Anticoagulation proceeding - appears plan is to change to lovenox bridge so can be discharged. 6. PVD bilat LE amp 7. Bones- phoslo and calcitriol  8. Anemia- Last Hb down 10.5. Recheck at outpt unit for ESA need.  9. Dispo- possibly today? (says going home w/lovenox bridge). Pharmacy has made lovenox recs/coumadin rec for  today    Jamal Maes, MD [place] [phone] Pager 08/14/2016, 11:41 AM

## 2016-08-14 NOTE — Progress Notes (Addendum)
ANTICOAGULATION CONSULT NOTE - Follow up [place] for Lovenox + warfarin Indication: AVR  Allergies  Allergen Reactions  . Acetaminophen Other (See Comments)    Liver transplant recipient   . Codeine Itching  . Mirtazapine Other (See Comments)    hallucination    Patient Measurements: Height: 5\' 4"  (162.6 cm) (prior to bilat BKA) Weight: 107 lb 8 oz (48.8 kg) IBW/kg (Calculated) : 54.7  Vital Signs: Temp: 98.2 F (36.8 C) (07/05 0737) Temp Source: Oral (07/05 0737) BP: 110/51 (07/05 0737) Pulse Rate: 70 (07/05 0737)  Labs:  Recent Labs  08/12/16 0339 08/12/16 1536 08/13/16 1111 08/13/16 2057 08/14/16 0632  HGB 11.5*  --  13.1  --  10.5*  HCT 36.1  --  39.7  --  31.8*  PLT 148*  --  130*  --  PENDING  LABPROT 22.7* 21.8* 22.6*  --  22.9*  INR 1.97 1.87 1.95  --  1.99  HEPARINUNFRC  --   --  <0.10* 0.37 0.27*  CREATININE  --  3.55* 4.88*  --   --     Estimated Creatinine Clearance: 9.3 mL/min (A) (by C-G formula based on SCr of 4.88 mg/dL (H)).   Medical History: Past Medical History:  Diagnosis Date  . Anemia    takes Folic Acid daily  . Anxiety   . Aortic stenosis   . Arthritis    "left hand, back" (08/30/2012)  . Asthma   . CAD (coronary artery disease)   . CAD (coronary artery disease) Jan. 2015   Cath: 20% LAD, 50% D1; s/p LIMA-LAD  . Chest pain 03/06/2015  . CHF (congestive heart failure) ([place]) 05/2016  . Chronic back pain   . Chronic bronchitis ([place])    "q yr w/season changes" (08/30/2012)  . Chronic constipation    takes MIralax and Colace daily  . COPD (chronic obstructive pulmonary disease) ([place])   . Depression    takes Cymbalta for "severe" depression  . End stage renal disease on dialysis [facility]) 02/27/2011   Kidneys shut down at time of liver transplant in Sept 2011 at [facility] in [place], she has been on HD ever since.  Dialyzes at [facility] HD on TTS schedule.  Had L forearm graft used 10 months then removed Dec 2012 due to  suspected infection.  A right upper arm AVG was placed Dec 2012 but she developed steal symptoms acutely and it was ligated the same day.  Never had an AV fistula due to small veins.  Now has L thigh AVG put in Jan 2013, has not clotted to date.    [name] ESRD on dialysis [facility])    "MWF; [facility]" (08/12/2016)  . GERD (gastroesophageal reflux disease)    takes Omeprazole daily  . Headache    "at least monthly" (08/30/2012)  . Hepatitis C   . History of blood transfusion    "several" (08/30/2012)  . Hypertension    takes Metoprolol and Lisinopril daily, sees Dr Bea Graff  . Hypothyroidism    takes Synthroid daily  . Migraine    "last migraine was in 2013" (08/30/2012)  . Neuromuscular disorder ([place])    carpal tunnel in right hand  . Obesity   . Peripheral vascular disease (HCC) hands and legs  . Pneumonia    "today and several times before" (08/30/2012)  . 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 S/P unilateral BKA (below knee amputation), left ([place]) 04/30/2016  . SVT (supraventricular tachycardia) ([place]) 06/09/14  . Tobacco abuse   . Type II diabetes mellitus (HCC)    Levemir 2units daily if > 150    Medications:  Prescriptions Prior to Admission  Medication Sig Dispense Refill Last Dose  . Calcium Carbonate Antacid (TUMS PO) Take 1 tablet by mouth daily with lunch.   Past Week at Unknown time  . gabapentin (NEURONTIN) 300 MG capsule Take 1 capsule (300 mg total) by mouth at bedtime. 30 capsule 1 Past Month at Unknown time  . methocarbamol (ROBAXIN) 500 MG tablet Take 0.5 tablets (250 mg total) by mouth every 6 (six) hours as needed for muscle spasms. 60 tablet 0 Past Week at Unknown time  . metoCLOPramide (REGLAN) 5 MG tablet Take 1 tablet (5 mg total) by mouth 2 (two) times daily before a meal. Decrease to once a day before breakfast after a couple of weeks. 60  tablet 0 Past Week at Unknown time  . metoprolol tartrate (LOPRESSOR) 25 MG tablet Take 0.5 tablets (12.5 mg total) by mouth 2 (two) times daily. 60 tablet 1 08/09/2016 at 8a  . multivitamin (RENA-VIT) TABS tablet Take 1 tablet by mouth at bedtime. 30 tablet 0 08/09/2016 at Unknown time  . polyethylene glycol (MIRALAX / GLYCOLAX) packet Take 17 g by mouth 2 (two) times daily. (Patient taking differently: Take 17 g by mouth 2 (two) times daily as needed (constipation). Mix in 8 oz liquid and drink) 60 each 0 Past Week at Unknown time  . tacrolimus (PROGRAF) 1 MG capsule Take 2 mg by mouth 2 (two) times daily.   08/09/2016  . traMADol (ULTRAM) 50 MG tablet Take 1 tablet (50 mg total) by mouth 4 (four) times daily. 60 tablet 0 Past Month at Unknown time  . warfarin (COUMADIN) 2.5 MG tablet Take 1.25-2.5 mg by mouth. Take 1.25 mg (1/2 tablet) daily except 2.5 mg (1 tablet) on Tues, Thurs, Sun   08/07/2016  . aspirin EC 81 MG tablet Take 81 mg by mouth daily at 12 noon.    08/08/2016  . calcitRIOL (ROCALTROL) 0.5 MCG capsule Take 1 capsule (0.5 mcg total) by mouth every Monday, Wednesday, and Friday with hemodialysis. 30 capsule 1 08/08/2016  . calcium acetate (PHOSLO) 667 MG capsule Take 1 capsule (667 mg total) by mouth 3 (three) times daily with meals. 90 capsule 1 08-08-16  . enoxaparin (LOVENOX) 60 MG/0.6ML injection Inject 0.5 mLs (50 mg total) into the skin daily. (Patient not taking: Reported on 08/10/2016) 15 Syringe 0 Not Taking at Unknown time  . feeding supplement (BOOST / RESOURCE BREEZE) LIQD Take 1 Container by mouth 3 (three) times daily between meals. 1 Container 100 unk  . levothyroxine (SYNTHROID, LEVOTHROID) 175 MCG tablet Take 1 tablet (175 mcg total) by mouth daily at 12 noon. 30 tablet 1 08-08-16  . nitroGLYCERIN (NITROSTAT) 0.4 MG SL tablet Place 1 tablet (0.4 mg total) under the tongue every 5 (five) minutes as needed for chest pain. 30 tablet 0 unk  . oxyCODONE 10 MG TABS Take 1 tablet (10  mg total) by mouth every 4 (four) hours as needed for moderate pain. 30 tablet 0 08/08/2016   Scheduled:  . aspirin  81 mg Oral Daily  . calcitRIOL  0.5 mcg Oral Q M,W,F-HD  . calcium acetate  667 mg Oral TID WC  . carvedilol  3.125 mg  Oral BID WC  . enoxaparin (LOVENOX) injection  50 mg Subcutaneous Q24H  . feeding supplement  1 Container Oral TID BM  . gabapentin  300 mg Oral QHS  . levothyroxine  175 mcg Oral QAC breakfast  . metoCLOPramide  5 mg Oral BID AC  . polyethylene glycol  17 g Oral BID  . sodium chloride flush  3 mL Intravenous Q12H  . tacrolimus  2 mg Oral BID  . traMADol  50 mg Oral QID  . Warfarin - Pharmacist Dosing Inpatient   Does not apply q1800   Infusions:  . sodium chloride 10 mL/hr at 08/13/16 0800  . sodium chloride    . dextrose 25 mL/hr at 08/10/16 2349    Assessment: 74 yoF s/p cath with plans for medical management. Pt on Coumadin PTA for Mechanical AVR (goal 2.5-3.5) to resume with heparin bridge initially, now changed to lovenox bridge. Of note, patient has some lovenox syringes at home from last admission.    INR remains subtherapeutic but trending up to 1.99. Hgb down to 10.5 but close to baseline. PLTC low stable. No bleeding noted.   Home Coumadin Dose: 1.25 mg daily except 2.5 mg on T/T/Sun  Goal of Therapy:  INR 2.5-3.5  Anti-Xa level 0.6-1 units/ml 4hrs after LMWH dose given Monitor platelets by anticoagulation protocol: Yes   Plan:  -Lovenox 50mg  SQ Q24H as previous -Warfarin 2.5 mg PO x1 tonight -Daily INR, CBC -Monitor for s/sx of bleeding, PO intake, new DDI, clinical picture  Carlean Jews, Pharm.D. Pharmacy Resident 08/14/2016 9:01 AM Main Pharmacy: [phone]

## 2016-08-14 NOTE — Care Management Note (Signed)
Case Management Note  Patient Details  Name: CRISSA SOWDER MRN: [id number] Date of Birth: [birth year]  Subjective/Objective:  Pt presented for SOB and CP-Plan for cardiac cath 08-12-16. Hx ESRD- HD MWF schedule.  Pt is from home with family support. Pt has had several admissions- would benefit from SNF. CSW did speak with patient and she is declining SNF at this time.                   Action/Plan: Pt is currently active with [facility]. Liaison is aware that pt is hospitalized. [facility] did follow up with patient as well. No further needs at this time.   Expected Discharge Date:  08/14/16               Expected Discharge Plan:  Skilled Nursing Facility  In-House Referral:  Clinical Social Work (Pt declining SNF)  Discharge planning Services  CM Consult  Post Acute Care Choice:  Home Health, Resumption of Svcs/PTA Provider Choice offered to:  Patient  DME Arranged:    DME Agency:     HH Arranged:  RN, PT [place] Agency:  [facility]  Status of Service:  In process, will continue to follow  If discussed at Long Length of Stay Meetings, dates discussed:    Additional Comments: 08/14/2016 Pt to discharge home today with lovenox - pt was on PTA and has supply.  CM contacted [facility] and informed that pt will need PT/INR checked beginning tomorrow  - agency will arrange to have these lab values checked and screened tomorrow and 7/9 and fax results as specified on discharge summary. Maryclare Labrador, RN 08/14/2016, 1:36 PM

## 2016-08-14 NOTE — Consult Note (Addendum)
[place] Nurse wound consult note Reason for Consult: Consult requested for coccyx wound; pt is familiar to the [facility] team from a recent admission, refer to consult on 6/22.  Wound appearance has greatly improved since that time. Wound type: Healing stage 3 pressure injury Pressure Injury POA: Yes Measurement: 2cm x .3cm x .1cm  Wound bed: 80% white scar tissue 20% pale pink shallow open wound in the center Drainage (amount, consistency, odor) no drainage or odor noted Periwound: intact Dressing procedure/placement/frequency: Foam dressing to protect from shear and promote healing.  Air mattress to reduce pressure.  Discussed plan of care with patient and she verbalized understanding. Please re-consult if further assistance is needed.  Thank-you,  Julien Girt MSN, [place], [place], [place], [place]

## 2016-08-14 NOTE — Discharge Summary (Signed)
Physician Discharge Summary   Patient ID: [name] MRN: [id number] DOB/AGE: 62-Sep-1957 62 y.o.  Admit date: 08/09/2016 Discharge date: 08/14/2016  Primary Care Physician:  System, Pcp Not In  Discharge Diagnoses:   Acute on chronic hypoxic respiratory failure Acute on chronic combined systolic and diastolic CHF exacerbation Elevated troponin Acute steal syndrome Mechanical aortic valve on chronic anticoagulation Type 2 diabetes mellitus with renal complications ESRD on hemodialysis MWF Essential hypertension Severe protein calorie malnutrition Bilateral BKA  Consults:   Nephrology Vascular surgery Cardiology   Recommendations for Outpatient Follow-up:  1. INR is 1.99 at the time of discharge. Patient still has Lovenox 50 mg syringes, 6 of them stay left from the previous admission, last month. Patient will continue Lovenox until INR consistently above 2. 2. Home health RN will check PT/INR tomorrow 7/6 and 7/9, fax both results to [facility] heart care Coumadin clinic 3. Please repeat CBC/BMET at next visit   DIET: Renal diet    Allergies:   Allergies  Allergen Reactions  . Acetaminophen Other (See Comments)    Liver transplant recipient   . Codeine Itching  . Mirtazapine Other (See Comments)    hallucination     DISCHARGE MEDICATIONS: Current Discharge Medication List    START taking these medications   Details  carvedilol (COREG) 3.125 MG tablet Take 1 tablet (3.125 mg total) by mouth 2 (two) times daily with a meal. Qty: 60 tablet, Refills: 3    ondansetron (ZOFRAN-ODT) 4 MG disintegrating tablet Take 1 tablet (4 mg total) by mouth every 8 (eight) hours as needed for nausea or vomiting. Qty: 20 tablet, Refills: 0      CONTINUE these medications which have CHANGED   Details  enoxaparin (LOVENOX) 60 MG/0.6ML injection Inject 0.5 mLs (50 mg total) into the skin daily. Please continue until INR stable above 2 Qty: 15 Syringe, Refills: 0      CONTINUE  these medications which have NOT CHANGED   Details  Calcium Carbonate Antacid (TUMS PO) Take 1 tablet by mouth daily with lunch.    gabapentin (NEURONTIN) 300 MG capsule Take 1 capsule (300 mg total) by mouth at bedtime. Qty: 30 capsule, Refills: 1    methocarbamol (ROBAXIN) 500 MG tablet Take 0.5 tablets (250 mg total) by mouth every 6 (six) hours as needed for muscle spasms. Qty: 60 tablet, Refills: 0    metoCLOPramide (REGLAN) 5 MG tablet Take 1 tablet (5 mg total) by mouth 2 (two) times daily before a meal. Decrease to once a day before breakfast after a couple of weeks. Qty: 60 tablet, Refills: 0    multivitamin (RENA-VIT) TABS tablet Take 1 tablet by mouth at bedtime. Qty: 30 tablet, Refills: 0    polyethylene glycol (MIRALAX / GLYCOLAX) packet Take 17 g by mouth 2 (two) times daily. Qty: 60 each, Refills: 0    tacrolimus (PROGRAF) 1 MG capsule Take 2 mg by mouth 2 (two) times daily.    traMADol (ULTRAM) 50 MG tablet Take 1 tablet (50 mg total) by mouth 4 (four) times daily. Qty: 60 tablet, Refills: 0    warfarin (COUMADIN) 2.5 MG tablet Take 1.25-2.5 mg by mouth. Take 1.25 mg (1/2 tablet) daily except 2.5 mg (1 tablet) on Tues, Thurs, Sun    aspirin EC 81 MG tablet Take 81 mg by mouth daily at 12 noon.     calcitRIOL (ROCALTROL) 0.5 MCG capsule Take 1 capsule (0.5 mcg total) by mouth every Monday, Wednesday, and Friday with hemodialysis. Qty: 30  capsule, Refills: 1    calcium acetate (PHOSLO) 667 MG capsule Take 1 capsule (667 mg total) by mouth 3 (three) times daily with meals. Qty: 90 capsule, Refills: 1    feeding supplement (BOOST / RESOURCE BREEZE) LIQD Take 1 Container by mouth 3 (three) times daily between meals. Qty: 1 Container, Refills: 100    levothyroxine (SYNTHROID, LEVOTHROID) 175 MCG tablet Take 1 tablet (175 mcg total) by mouth daily at 12 noon. Qty: 30 tablet, Refills: 1    nitroGLYCERIN (NITROSTAT) 0.4 MG SL tablet Place 1 tablet (0.4 mg total) under  the tongue every 5 (five) minutes as needed for chest pain. Qty: 30 tablet, Refills: 0    oxyCODONE 10 MG TABS Take 1 tablet (10 mg total) by mouth every 4 (four) hours as needed for moderate pain. Qty: 30 tablet, Refills: 0      STOP taking these medications     metoprolol tartrate (LOPRESSOR) 25 MG tablet          Brief H and P: For complete details please refer to admission H and P, but in brief [name] Maldonado [name] a 61 y.o.femalewith medical history significant of ESRD on HD MWF, Combined CHF, Mechanical Aortic valve on a/c, b/l BKA, Type 2 DM and CAD, presented to [facility] c/o chest pain and SOB. Patient was found to be hypoxic with mild elevated troponin and some EKG changed and was transferred to [facility] for further evaluation. Patient was found to be on pulmonary edema was placed on BiPAP and had emergent Dialysis. Cardiac work up revealed EKG changes and ECHO with new ischemic changes. Cardiology was consulted. Patient underwent cardiac catheterization on 7/4, medical management was recommended. During hemodialysis on 7/2 she had symptoms of steal syndrome during dialysis and underwent AVF ligation.    Hospital Course:   Acute on chronic hypoxic respiratory failure - Currently improved, patient presented with acute on chronic hypoxic respiratory failure likely due to pulmonary edema, progressive CHF. Patient initially presented with substernal chest pain, dyspnea, palpitations. She was recently discharged on 6/23 after admitted with fluid overload. She underwent emergent dialysis on 6/30 due to fluid overload.  - 2-D echo showed lateral wall akinesis of the mid/distal inferior septal wall - Due to concern for possible ischemia, patient underwent cardiac cath. 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 The CTO vessels and likely not acute, the one potential PCI target would be  rPDA, but with distal RCA calcified lesions this will be complicated, and medical management was recommended.  Acute on chronic combined systolic and diastolic CHF, elevated troponins - Cardiology was consulted, history of CABG. - Patient underwent cardiac cath on 7/3, report as above. Medical management was recommended - Fluid management with hemodialysis  Steal syndrome - Vascular surgery was consulted, status post fistula ligation on 7/2 - Per vascular surgery, given her extensive steal symptoms with each access, she will most likely require TDC permanently  Mechanical aortic valve, on chronic anticoagulation -INR still subtherapeutic, 1.99. Patient was on IV heparin and Coumadin - She still has 6 syringes off Lovenox 50 mg left from previous admission last month. Patient is fairly comfortable taking Lovenox. Hence she was given 1 dose of therapeutic Lovenox before discharge, she will resume Lovenox as a bridge with Coumadin. Home health RN will check PT/INR tomorrow 7/6 and on 7/9. If INR has been consistently above  2, patient can discontinue Lovenox. - Heparin drip has been discontinued.   Essential hypertension - Currently stable, continue Coreg  ESRD on hemodialysis - Continue hemodialysis MWF, nephrology was consulted. Patient underwent emergent dialysis on admission. 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 HEENT: anicteric sclera, pupils reactive to light and accommodation CVS: S1-S2 clear no murmur rubs or gallops, metallic click Chest: clear to auscultation bilaterally, no wheezing rales or rhonchi Abdomen: soft nontender, nondistended, normal bowel sounds Extremities: Bilateral BKA Neuro: Cranial nerves II-XII intact, no focal neurological deficits   The results of significant diagnostics from this hospitalization (including imaging, microbiology, ancillary and laboratory) are listed below for reference.    LAB RESULTS: Basic Metabolic Panel:  Recent Labs Lab 08/12/16 1536 08/13/16 1111  NA 130* 132*  K 5.1 4.4  CL 96* 97*  CO2 21* 24  GLUCOSE 83 117*  BUN 16 21*  CREATININE 3.55* 4.88*  CALCIUM 9.0 8.3*  PHOS  --  7.3*   Liver Function Tests:  Recent Labs Lab 08/09/16 2044 08/09/16 2251 08/13/16 1111  AST 62*  --   --   ALT 31  --   --   ALKPHOS 294*  --   --   BILITOT 1.8*  --   --   PROT 7.8  --   --   ALBUMIN 1.8* 1.8* 1.9*   No results for input(s): LIPASE, AMYLASE in the last 168 hours. No results for input(s): AMMONIA in the last 168 hours. CBC:  Recent Labs Lab 08/10/16 0745  08/13/16 1111 08/14/16 0632  WBC 8.4  < > 5.6 7.3  NEUTROABS 5.7  --   --   --   HGB 14.2  < > 13.1 10.5*  HCT 43.1  < > 39.7 31.8*  MCV 89.4  < > 89.4 87.6  PLT 125*  < > 130* 114*  < > = values in this interval not displayed. Cardiac Enzymes:  Recent Labs Lab 08/10/16 0415 08/10/16 0745  TROPONINI 0.47* 0.44*   BNP: Invalid input(s): POCBNP CBG:  Recent Labs Lab 08/13/16 0758 08/13/16 1208  GLUCAP 77 130*    Significant Diagnostic Studies:  Portable Chest 1 View  Result Date: 08/09/2016 CLINICAL DATA:  Shortness of breath. EXAM: PORTABLE CHEST 1 VIEW COMPARISON:  August 08, 2016 FINDINGS: Stable PermCath. No pneumothorax. The cardiomediastinal silhouette is unchanged. There is a tiny left effusion with underlying atelectasis. There is a moderate  right effusion which is larger in the interval. Probable asymmetric edema, right greater than left. IMPRESSION: Bilateral pleural effusions, right greater than left with probable asymmetric edema, right greater than left. The findings have worsened in the interval. Electronically Signed   By: Dorise Bullion III M.D   On: 08/09/2016 18:55    2D ECHO: Study Conclusions  - Left ventricle: LVEF is depressed at approximately  30% with   hypokdinesis of the anterior, apical, lateral wallsl Akinesis of   the mid/distal inferior/inferoseptal walls.   These findings are new compared to echo from Jan 2018 The cavity   size was normal. Wall thickness was normal. - Aortic valve: AV prosthesis is present Peak and mean gradients   through the valve are 19 and 11 mm Hg respectively . There was   mild regurgitation. - Mitral valve: Calcified annulus. There was mild regurgitation. - Left atrium: The atrium was mildly dilated. - Right ventricle: Systolic function was mildly reduced.  Disposition and Follow-up: Discharge Instructions    Discharge instructions    Complete by:  As directed    Please continue Lovenox 50 mg daily subcutaneous until INR above 2. Please have home health RN check the INR tomorrow and fax it to the Coumadin clinic. Repeat the PT/INR level on Monday 7/9.   Increase activity slowly    Complete by:  As directed        DISPOSITION: home    [place]    Conrad Lookout, MD Follow up in 2 week(s).   Specialties:  Vascular Surgery, Cardiology Contact information: [place] [address] [phone]        [place] [facility] Follow up on 08/18/2016.   Specialty:  Cardiology Why:  please come to the office to check PT/INR   Contact information: [address] [place]       Troy Sine, MD Follow up.   Specialty:  Cardiology Why:  Office will call you for  your follow-up appointment with Dr. [name] in 4-6 weeks as well as Coumadin clinic check on Monday. Call the office on Friday if you have not heard back. Contact information: [address] [place] [address] [phone]            Time spent on Discharge: 10mins   Signed:   Estill Cotta M.D. [facility] 08/14/2016, 12:06 PM Pager: [phone]

## 2016-08-14 NOTE — Progress Notes (Signed)
Patient received discharge instructions with daughter at the bedside. Peripheral IV removed. Received new orders for lovenox injections daily. Educated patient and teach back was done. She was able to self-administer her own lovenox injection with no difficulty. Stated that she had done this in the past. Received education on new medication and changes. Patient had no further questions and verbalized understanding. Wheeled via family member, denied staff to wheel out.

## 2016-08-15 ENCOUNTER — Ambulatory Visit (INDEPENDENT_AMBULATORY_CARE_PROVIDER_SITE_OTHER): Payer: Medicare Other | Admitting: Pharmacist

## 2016-08-15 DIAGNOSIS — I132 Hypertensive heart and chronic kidney disease with heart failure and with stage 5 chronic kidney disease, or end stage renal disease: Secondary | ICD-10-CM | POA: Diagnosis not present

## 2016-08-15 DIAGNOSIS — D631 Anemia in chronic kidney disease: Secondary | ICD-10-CM | POA: Diagnosis not present

## 2016-08-15 DIAGNOSIS — E1151 Type 2 diabetes mellitus with diabetic peripheral angiopathy without gangrene: Secondary | ICD-10-CM | POA: Diagnosis not present

## 2016-08-15 DIAGNOSIS — E1122 Type 2 diabetes mellitus with diabetic chronic kidney disease: Secondary | ICD-10-CM | POA: Diagnosis not present

## 2016-08-15 DIAGNOSIS — N2581 Secondary hyperparathyroidism of renal origin: Secondary | ICD-10-CM | POA: Diagnosis not present

## 2016-08-15 DIAGNOSIS — E876 Hypokalemia: Secondary | ICD-10-CM | POA: Diagnosis not present

## 2016-08-15 DIAGNOSIS — I5042 Chronic combined systolic (congestive) and diastolic (congestive) heart failure: Secondary | ICD-10-CM | POA: Diagnosis not present

## 2016-08-15 DIAGNOSIS — N186 End stage renal disease: Secondary | ICD-10-CM | POA: Diagnosis not present

## 2016-08-15 DIAGNOSIS — Z4781 Encounter for orthopedic aftercare following surgical amputation: Secondary | ICD-10-CM | POA: Diagnosis not present

## 2016-08-15 DIAGNOSIS — L89152 Pressure ulcer of sacral region, stage 2: Secondary | ICD-10-CM | POA: Diagnosis not present

## 2016-08-15 DIAGNOSIS — Z7901 Long term (current) use of anticoagulants: Secondary | ICD-10-CM

## 2016-08-15 DIAGNOSIS — E118 Type 2 diabetes mellitus with unspecified complications: Secondary | ICD-10-CM | POA: Diagnosis not present

## 2016-08-15 LAB — PROTIME-INR: INR: 2.3 — AB (ref <=1.1)

## 2016-08-16 DIAGNOSIS — I509 Heart failure, unspecified: Secondary | ICD-10-CM | POA: Diagnosis not present

## 2016-08-16 DIAGNOSIS — I10 Essential (primary) hypertension: Secondary | ICD-10-CM | POA: Diagnosis not present

## 2016-08-16 DIAGNOSIS — E11622 Type 2 diabetes mellitus with other skin ulcer: Secondary | ICD-10-CM | POA: Diagnosis not present

## 2016-08-16 DIAGNOSIS — Z7901 Long term (current) use of anticoagulants: Secondary | ICD-10-CM | POA: Diagnosis not present

## 2016-08-16 DIAGNOSIS — I739 Peripheral vascular disease, unspecified: Secondary | ICD-10-CM | POA: Diagnosis not present

## 2016-08-18 ENCOUNTER — Ambulatory Visit (INDEPENDENT_AMBULATORY_CARE_PROVIDER_SITE_OTHER): Payer: Medicare Other | Admitting: Pharmacist

## 2016-08-18 DIAGNOSIS — Z7901 Long term (current) use of anticoagulants: Secondary | ICD-10-CM

## 2016-08-18 DIAGNOSIS — D631 Anemia in chronic kidney disease: Secondary | ICD-10-CM | POA: Diagnosis not present

## 2016-08-18 DIAGNOSIS — Z4781 Encounter for orthopedic aftercare following surgical amputation: Secondary | ICD-10-CM | POA: Diagnosis not present

## 2016-08-18 DIAGNOSIS — E876 Hypokalemia: Secondary | ICD-10-CM | POA: Diagnosis not present

## 2016-08-18 DIAGNOSIS — I5042 Chronic combined systolic (congestive) and diastolic (congestive) heart failure: Secondary | ICD-10-CM | POA: Diagnosis not present

## 2016-08-18 DIAGNOSIS — N2581 Secondary hyperparathyroidism of renal origin: Secondary | ICD-10-CM | POA: Diagnosis not present

## 2016-08-18 DIAGNOSIS — L89152 Pressure ulcer of sacral region, stage 2: Secondary | ICD-10-CM | POA: Diagnosis not present

## 2016-08-18 DIAGNOSIS — N186 End stage renal disease: Secondary | ICD-10-CM | POA: Diagnosis not present

## 2016-08-18 DIAGNOSIS — E1122 Type 2 diabetes mellitus with diabetic chronic kidney disease: Secondary | ICD-10-CM | POA: Diagnosis not present

## 2016-08-18 DIAGNOSIS — E1151 Type 2 diabetes mellitus with diabetic peripheral angiopathy without gangrene: Secondary | ICD-10-CM | POA: Diagnosis not present

## 2016-08-18 DIAGNOSIS — E118 Type 2 diabetes mellitus with unspecified complications: Secondary | ICD-10-CM | POA: Diagnosis not present

## 2016-08-18 DIAGNOSIS — I132 Hypertensive heart and chronic kidney disease with heart failure and with stage 5 chronic kidney disease, or end stage renal disease: Secondary | ICD-10-CM | POA: Diagnosis not present

## 2016-08-18 LAB — PROTIME-INR: INR: 2 — AB (ref <=1.1)

## 2016-08-19 DIAGNOSIS — I5042 Chronic combined systolic (congestive) and diastolic (congestive) heart failure: Secondary | ICD-10-CM | POA: Diagnosis not present

## 2016-08-19 DIAGNOSIS — I132 Hypertensive heart and chronic kidney disease with heart failure and with stage 5 chronic kidney disease, or end stage renal disease: Secondary | ICD-10-CM | POA: Diagnosis not present

## 2016-08-19 DIAGNOSIS — E1151 Type 2 diabetes mellitus with diabetic peripheral angiopathy without gangrene: Secondary | ICD-10-CM | POA: Diagnosis not present

## 2016-08-19 DIAGNOSIS — Z4781 Encounter for orthopedic aftercare following surgical amputation: Secondary | ICD-10-CM | POA: Diagnosis not present

## 2016-08-19 DIAGNOSIS — E1122 Type 2 diabetes mellitus with diabetic chronic kidney disease: Secondary | ICD-10-CM | POA: Diagnosis not present

## 2016-08-19 DIAGNOSIS — L89152 Pressure ulcer of sacral region, stage 2: Secondary | ICD-10-CM | POA: Diagnosis not present

## 2016-08-20 DIAGNOSIS — D631 Anemia in chronic kidney disease: Secondary | ICD-10-CM | POA: Diagnosis not present

## 2016-08-20 DIAGNOSIS — E876 Hypokalemia: Secondary | ICD-10-CM | POA: Diagnosis not present

## 2016-08-20 DIAGNOSIS — N186 End stage renal disease: Secondary | ICD-10-CM | POA: Diagnosis not present

## 2016-08-20 DIAGNOSIS — N2581 Secondary hyperparathyroidism of renal origin: Secondary | ICD-10-CM | POA: Diagnosis not present

## 2016-08-20 DIAGNOSIS — E118 Type 2 diabetes mellitus with unspecified complications: Secondary | ICD-10-CM | POA: Diagnosis not present

## 2016-08-22 ENCOUNTER — Encounter: Payer: Self-pay | Admitting: Physical Medicine & Rehabilitation

## 2016-08-22 ENCOUNTER — Ambulatory Visit (HOSPITAL_BASED_OUTPATIENT_CLINIC_OR_DEPARTMENT_OTHER): Payer: Medicare Other | Admitting: Physical Medicine & Rehabilitation

## 2016-08-22 ENCOUNTER — Encounter: Payer: Medicare Other | Attending: Physical Medicine & Rehabilitation

## 2016-08-22 VITALS — BP 103/61 | HR 79

## 2016-08-22 DIAGNOSIS — Z944 Liver transplant status: Secondary | ICD-10-CM | POA: Insufficient documentation

## 2016-08-22 DIAGNOSIS — J449 Chronic obstructive pulmonary disease, unspecified: Secondary | ICD-10-CM | POA: Diagnosis not present

## 2016-08-22 DIAGNOSIS — Z8701 Personal history of pneumonia (recurrent): Secondary | ICD-10-CM | POA: Insufficient documentation

## 2016-08-22 DIAGNOSIS — K219 Gastro-esophageal reflux disease without esophagitis: Secondary | ICD-10-CM | POA: Insufficient documentation

## 2016-08-22 DIAGNOSIS — D649 Anemia, unspecified: Secondary | ICD-10-CM | POA: Insufficient documentation

## 2016-08-22 DIAGNOSIS — F17210 Nicotine dependence, cigarettes, uncomplicated: Secondary | ICD-10-CM | POA: Diagnosis not present

## 2016-08-22 DIAGNOSIS — Z89512 Acquired absence of left leg below knee: Secondary | ICD-10-CM | POA: Insufficient documentation

## 2016-08-22 DIAGNOSIS — D631 Anemia in chronic kidney disease: Secondary | ICD-10-CM | POA: Diagnosis not present

## 2016-08-22 DIAGNOSIS — Z992 Dependence on renal dialysis: Secondary | ICD-10-CM | POA: Diagnosis not present

## 2016-08-22 DIAGNOSIS — Z809 Family history of malignant neoplasm, unspecified: Secondary | ICD-10-CM | POA: Insufficient documentation

## 2016-08-22 DIAGNOSIS — E039 Hypothyroidism, unspecified: Secondary | ICD-10-CM | POA: Insufficient documentation

## 2016-08-22 DIAGNOSIS — Z9889 Other specified postprocedural states: Secondary | ICD-10-CM | POA: Diagnosis not present

## 2016-08-22 DIAGNOSIS — I129 Hypertensive chronic kidney disease with stage 1 through stage 4 chronic kidney disease, or unspecified chronic kidney disease: Secondary | ICD-10-CM | POA: Diagnosis not present

## 2016-08-22 DIAGNOSIS — Z8249 Family history of ischemic heart disease and other diseases of the circulatory system: Secondary | ICD-10-CM | POA: Diagnosis not present

## 2016-08-22 DIAGNOSIS — Z833 Family history of diabetes mellitus: Secondary | ICD-10-CM | POA: Insufficient documentation

## 2016-08-22 DIAGNOSIS — Z955 Presence of coronary angioplasty implant and graft: Secondary | ICD-10-CM | POA: Diagnosis not present

## 2016-08-22 DIAGNOSIS — E118 Type 2 diabetes mellitus with unspecified complications: Secondary | ICD-10-CM | POA: Diagnosis not present

## 2016-08-22 DIAGNOSIS — R51 Headache: Secondary | ICD-10-CM | POA: Insufficient documentation

## 2016-08-22 DIAGNOSIS — Z89511 Acquired absence of right leg below knee: Secondary | ICD-10-CM | POA: Insufficient documentation

## 2016-08-22 DIAGNOSIS — E1122 Type 2 diabetes mellitus with diabetic chronic kidney disease: Secondary | ICD-10-CM | POA: Insufficient documentation

## 2016-08-22 DIAGNOSIS — N186 End stage renal disease: Secondary | ICD-10-CM | POA: Diagnosis not present

## 2016-08-22 DIAGNOSIS — Z823 Family history of stroke: Secondary | ICD-10-CM | POA: Insufficient documentation

## 2016-08-22 DIAGNOSIS — I2510 Atherosclerotic heart disease of native coronary artery without angina pectoris: Secondary | ICD-10-CM | POA: Diagnosis not present

## 2016-08-22 DIAGNOSIS — N189 Chronic kidney disease, unspecified: Secondary | ICD-10-CM | POA: Insufficient documentation

## 2016-08-22 DIAGNOSIS — E876 Hypokalemia: Secondary | ICD-10-CM | POA: Diagnosis not present

## 2016-08-22 DIAGNOSIS — Z993 Dependence on wheelchair: Secondary | ICD-10-CM | POA: Insufficient documentation

## 2016-08-22 DIAGNOSIS — Z7902 Long term (current) use of antithrombotics/antiplatelets: Secondary | ICD-10-CM | POA: Insufficient documentation

## 2016-08-22 DIAGNOSIS — Z9049 Acquired absence of other specified parts of digestive tract: Secondary | ICD-10-CM | POA: Diagnosis not present

## 2016-08-22 DIAGNOSIS — E669 Obesity, unspecified: Secondary | ICD-10-CM | POA: Diagnosis not present

## 2016-08-22 DIAGNOSIS — Z9851 Tubal ligation status: Secondary | ICD-10-CM | POA: Insufficient documentation

## 2016-08-22 DIAGNOSIS — N2581 Secondary hyperparathyroidism of renal origin: Secondary | ICD-10-CM | POA: Diagnosis not present

## 2016-08-22 NOTE — Patient Instructions (Signed)
Please take 2 tramadol and 1. Tylenol before you go to bed at night  Follow-up with Dr. Oneida Alar. He will determine when you are ready to be fit for your prosthetics.  Finish out your home health therapy and start the outpatient therapy after your prostheses are finished

## 2016-08-22 NOTE — Progress Notes (Signed)
Subjective:    Patient ID: [name], female    DOB: [birth year], 61 y.o.   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  She lives with her daughter, essentially wheelchair-bound prior to admission.  She had not received a prosthesis yet for prior BKA.  Presented on Jun 30, 2016, with gangrenous right heel right great toe, findings of arterial occlusive disease.  No change with conservative care and underwent right BKA on Jul 08, 2016, per Dr. [name].  Hospital course, pain management.  [employer] consulted for fitting of limb guard.  Acute on chronic anemia 9.4 and monitored.  Leukocytosis 20,600-23,000. Maintained on intravenous heparin, chronic Coumadin resume, hemodialysis as per [facility].  Physical and occupational therapy ongoing.  The patient was admitted for a comprehensive rehab program.   DATE OF ADMISSION:  07/10/2016 DATE OF DISCHARGE:  07/24/2016  HPI Patient rehospitalized 07/31/2016-08/02/2016 for fluid overload, acute on chronic CHF. Patient states that her dry weight was not calculated correctly with her hemodialysis as an outpatient.  Patient rehospitalized for acute on chronic respiratory failure, acute on chronic combined systolic and diastolic CHF exacerbation.Admit date: 08/09/2016 Discharge date: 08/14/2016  Patient has not had any home health therapy after rehospitalization, but somebody is supposed to come out next week Tuesday Doing outpatient dialysis Monday, Wednesday, Friday Patient had a home visit from her physician/PCP in [facility]  Patient is able to do wheelchair to bed and wheelchair to car transfers.  Patient does not have a prosthesis from her left below-knee amputation performed in March. Waiting to have both prosthetics made at the  same time, once the right below-knee amputation, heels  Pain Inventory Average Pain 8 Pain Right Now 6 My pain is intermittent, sharp, stabbing, tingling and aching  In the last 24 hours, has pain interfered with the following? General activity 7 Relation with others 7 Enjoyment of life 8 What TIME of day is your pain at its worst? night Sleep (in general) Poor  Pain is worse with: sitting Pain improves with: medication Relief from Meds: 4  Mobility ability to climb steps?  no do you drive?  no use a wheelchair needs help with transfers  Function disabled: date disabled 2009 I need assistance with the following:  dressing, bathing, toileting, meal prep, household duties and shopping  Neuro/Psych bladder control problems weakness numbness tingling depression anxiety  Prior Studies Any changes since last visit?  no  Physicians involved in your care Any changes since last visit?  no   Family History  Problem Relation Age of Onset  . Cancer Mother   . Diabetes Mother   . Hypertension Mother   . Stroke Mother   . Cancer Father   . Anesthesia problems Neg Hx   . Hypotension Neg Hx   . Malignant hyperthermia Neg Hx   . Pseudochol deficiency Neg Hx    Social History   Social History  . Marital status: Divorced    Spouse name: N/A  . Number of children: N/A  . Years of education: N/A   Social History Main Topics  . Smoking status: Current Every Day Smoker    Packs/day: 0.33    Years: 43.00    Types: Cigarettes  . Smokeless tobacco: Never Used  . Alcohol use No  . Drug use: No  . Sexual activity: No  Other Topics Concern  . Not on file   Social History Narrative  . No narrative on file   Past Surgical History:  Procedure Laterality Date  . ABDOMINAL AORTOGRAM W/LOWER EXTREMITY N/A 07/04/2016   Procedure: Abdominal Aortogram w/Lower Extremity;  Surgeon: Elam Dutch, MD;  Location: [place] [facility];  Service: Cardiovascular;  Laterality:  N/A;  . AMPUTATION Left 04/24/2016   Procedure: AMPUTATION BELOW KNEE;  Surgeon: Angelia Mould, MD;  Location: [place];  Service: Vascular;  Laterality: Left;  . AMPUTATION Right 07/08/2016   Procedure: AMPUTATION BELOW KNEE;  Surgeon: Angelia Mould, MD;  Location: [place];  Service: Vascular;  Laterality: Right;  . AORTIC VALVE REPLACEMENT N/A 03/15/2013   AVR; Surgeon: Ivin Poot, MD;  Location: [facility] OR; Open Heart Surgery;  53mm[employer] mechanical prosthesis, top hat valve  . ARTERIOVENOUS GRAFT PLACEMENT Left 10/03/10    forearm  . AV FISTULA PLACEMENT  01/29/2011   Procedure: INSERTION OF ARTERIOVENOUS (AV) GORE-TEX GRAFT ARM;  Surgeon: Elam Dutch, MD;  Location: [facility] OR;  Service: Vascular;  Laterality: Right;  . AV FISTULA PLACEMENT  03/10/2011   Procedure: INSERTION OF ARTERIOVENOUS (AV) GORE-TEX GRAFT THIGH;  Surgeon: Elam Dutch, MD;  Location: [place];  Service: Vascular;  Laterality: Left;  . AV FISTULA PLACEMENT Left 07/21/2016   Procedure: CREATION OF LEFT UPPER ARM ARTERIOVENOUS (AV) FISTULA;  Surgeon: Conrad New Ringgold, MD;  Location: [place];  Service: Vascular;  Laterality: Left;  . [place] REMOVAL  12/23/2010   Procedure: REMOVAL OF ARTERIOVENOUS GORETEX GRAFT ([place]);  Surgeon: Elam Dutch, MD;  Location: [place];  Service: Vascular;  Laterality: Left;  procedure started @1736 -0160  . CHOLECYSTECTOMY  1993  . CORONARY ANGIOGRAPHY N/A 08/12/2016   Procedure: CORONARY ANGIOGRAPHY (CATH LAB);  Surgeon: Leonie Man, MD;  Location: [place] CV [facility];  Service: Cardiovascular;  Laterality: N/A;  . CORONARY ARTERY BYPASS GRAFT N/A 03/15/2013   Procedure: CORONARY ARTERY BYPASS GRAFTING (CABG) times one using left internal mammary artery.;  Surgeon: Ivin Poot, MD;  Location: [place];  Service: Open Heart Surgery;  Laterality: N/A;  POSS CABG X 1  . CYSTOSCOPY  1990's  . INSERTION OF DIALYSIS CATHETER  12/23/2010   Procedure: INSERTION OF DIALYSIS CATHETER;   Surgeon: Elam Dutch, MD;  Location: [place];  Service: Vascular;  Laterality: Right;  Right Internal Jugular 28cm dialysis catheter insertion procedure time 1701-1720   . INSERTION OF DIALYSIS CATHETER Right 03/11/2016   Procedure: INSERTION OF DIALYSIS CATHETER;  Surgeon: Angelia Mould, MD;  Location: [place];  Service: Vascular;  Laterality: Right;  . INTRAOPERATIVE TRANSESOPHAGEAL ECHOCARDIOGRAM N/A 03/15/2013   Procedure: INTRAOPERATIVE TRANSESOPHAGEAL ECHOCARDIOGRAM;  Surgeon: Ivin Poot, MD;  Location: [place];  Service: Open Heart Surgery;  Laterality: N/A;  . LEFT HEART CATHETERIZATION WITH CORONARY ANGIOGRAM N/A 07/29/2012   Procedure: LEFT HEART CATHETERIZATION WITH CORONARY ANGIOGRAM;  Surgeon: Troy Sine, MD;  Location: [facility] CATH LAB;  Service: Cardiovascular;  Laterality: N/A;  . LEFT HEART CATHETERIZATION WITH CORONARY ANGIOGRAM N/A 03/10/2013   Procedure: LEFT HEART CATHETERIZATION WITH CORONARY ANGIOGRAM;  Surgeon: Troy Sine, MD;  Location: [facility] CATH LAB;  Service: Cardiovascular;  Laterality: N/A;  . LEFT HEART CATHETERIZATION WITH CORONARY/GRAFT ANGIOGRAM N/A 12/24/2011   Procedure: LEFT HEART CATHETERIZATION WITH Beatrix Fetters;  Surgeon: [name], MD;  Location: [facility] CATH LAB;  Service: Cardiovascular;  Laterality: N/A;  . LEFT HEART CATHETERIZATION WITH CORONARY/GRAFT ANGIOGRAM  N/A 12/16/2013   Procedure: LEFT HEART CATHETERIZATION WITH Beatrix Fetters;  Surgeon: Troy Sine, MD;  Location: [facility] CATH LAB;  Service: Cardiovascular;  Laterality: N/A;  . LIGATION ARTERIOVENOUS GORTEX GRAFT Left 03/11/2016   Procedure: LIGATION THIGH ARTERIOVENOUS GORTEX GRAFT;  Surgeon: Angelia Mould, MD;  Location: [place];  Service: Vascular;  Laterality: Left;  . LIGATION OF ARTERIOVENOUS  FISTULA Left 08/11/2016   Procedure: LIGATION OF ARTERIOVENOUS  FISTULA;  Surgeon: Elam Dutch, MD;  Location: [place];  Service: Vascular;  Laterality: Left;  .  LIVER TRANSPLANT  10/25/2009   sees Dr Ferol Luz 1 every 6 months, saw last in Dec 2013. [name] Coord [phone]  . PERIPHERAL VASCULAR CATHETERIZATION N/A 11/06/2015   Procedure: Abdominal Aortogram;  Surgeon: Serafina Mitchell, MD;  Location: [place] [facility];  Service: Cardiovascular;  Laterality: N/A;  . PERIPHERAL VASCULAR CATHETERIZATION N/A 11/06/2015   Procedure: Lower Extremity Angiography;  Surgeon: Serafina Mitchell, MD;  Location: [place] [facility];  Service: Cardiovascular;  Laterality: N/A;  . PERIPHERAL VASCULAR CATHETERIZATION  11/06/2015   Procedure: Peripheral Vascular Atherectomy;  Surgeon: Serafina Mitchell, MD;  Location: [place] CV LAB;  Service: Cardiovascular;;  Left Superficial femoral  . SHUNTOGRAM Left 05/15/2014   Procedure: SHUNTOGRAM;  Surgeon: Conrad Garrochales, MD;  Location: [facility] CATH LAB;  Service: Cardiovascular;  Laterality: Left;  . SMALL INTESTINE SURGERY  90's  . SPINAL GROWTH RODS  2010   "put 2 metal rods in my back; they had detetriorated" (08/30/2012)  . THROMBECTOMY    . THROMBECTOMY AND REVISION OF ARTERIOVENTOUS (AV) GORETEX  GRAFT Left 03/30/2014  . THROMBECTOMY AND REVISION OF ARTERIOVENTOUS (AV) GORETEX  GRAFT Left 03/30/2014   Procedure: THROMBECTOMY AND REVISION OF ARTERIOVENTOUS (AV) GORETEX  GRAFT;  Surgeon: Conrad , MD;  Location: [place];  Service: Vascular;  Laterality: Left;  . TUBAL LIGATION  1990's   Past Medical History:  Diagnosis Date  . Anemia    takes Folic Acid daily  . Anxiety   . Aortic stenosis   . Arthritis    "left hand, back" (08/30/2012)  . Asthma   . CAD (coronary artery disease)   . CAD (coronary artery disease) Jan. 2015   Cath: 20% LAD, 50% D1; s/p LIMA-LAD  . Chest pain 03/06/2015  . CHF (congestive heart failure) ([place]) 05/2016  . Chronic back pain   . Chronic bronchitis ([place])    "q yr w/season changes" (08/30/2012)  . Chronic constipation    takes MIralax and Colace daily  . COPD (chronic obstructive  pulmonary disease) ([place])   . Depression    takes Cymbalta for "severe" depression  . End stage renal disease on dialysis [facility]) 02/27/2011   Kidneys shut down at time of liver transplant in Sept 2011 at [facility] in [place], she has been on HD ever since.  Dialyzes at [facility] HD on TTS schedule.  Had L forearm graft used 10 months then removed Dec 2012 due to suspected infection.  A right upper arm AVG was placed Dec 2012 but she developed steal symptoms acutely and it was ligated the same day.  Never had an AV fistula due to small veins.  Now has L thigh AVG put in Jan 2013, has not clotted to date.    [name] ESRD on dialysis [facility])    "MWF; [facility]" (08/12/2016)  . GERD (gastroesophageal reflux disease)    takes Omeprazole daily  . Headache    "at least  monthly" (08/30/2012)  . Hepatitis C   . History of blood transfusion    "several" (08/30/2012)  . Hypertension    takes Metoprolol and Lisinopril daily, sees Dr Bea Graff  . Hypothyroidism    takes Synthroid daily  . Migraine    "last migraine was in 2013" (08/30/2012)  . Neuromuscular disorder ([place])    carpal tunnel in right hand  . Obesity   . Peripheral vascular disease (HCC) hands and legs  . Pneumonia    "today and several times before" (08/30/2012)  . 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 S/P unilateral BKA (below knee amputation), left ([place]) 04/30/2016  . SVT (supraventricular tachycardia) ([place]) 06/09/14  . Tobacco abuse   . Type II diabetes mellitus (HCC)    Levemir 2units daily if > 150   There were no vitals taken for this visit.  Opioid Risk Score:   Fall Risk Score:  `1  Depression screen PHQ 2/9  Depression screen PHQ 2/9 09/18/2014  Decreased Interest 1  Down, Depressed, Hopeless 2  PHQ - 2 Score 3  Altered sleeping 2  Tired, decreased energy 3  Change in appetite 1  Feeling bad or  failure about yourself  0  Trouble concentrating 0  Moving slowly or fidgety/restless 1  Suicidal thoughts 0  PHQ-9 Score 10  Difficult doing work/chores Not difficult at all  Some recent data might be hidden     Review of Systems  Constitutional: Positive for unexpected weight change.  HENT: Negative.   Eyes: Negative.   Respiratory: Negative.   Cardiovascular: Negative.   Gastrointestinal: Positive for constipation, nausea and vomiting.  Endocrine: Negative.   Genitourinary: Negative.   Musculoskeletal: Negative.   Skin: Negative.   Allergic/Immunologic: Negative.   Neurological: Negative.   Hematological: Bruises/bleeds easily.  Psychiatric/Behavioral: Negative.   All other systems reviewed and are negative.      Objective:   Physical Exam  Constitutional: She is oriented to person, place, and time. She appears well-developed and well-nourished.  HENT:  Head: Normocephalic and atraumatic.  Eyes: Pupils are equal, round, and reactive to light. Conjunctivae are normal.  Cardiovascular: Normal rate, regular rhythm and normal heart sounds.  Exam reveals no friction rub.   No murmur heard. Pulmonary/Chest: Effort normal and breath sounds normal. No respiratory distress. She has no wheezes.  Abdominal: Soft. Bowel sounds are normal. She exhibits no distension. There is no tenderness.  Neurological: She is alert and oriented to person, place, and time.  Psychiatric: She has a normal mood and affect.  Nursing note and vitals reviewed. Left below-knee amputation is well-healed. There is no significant edema. Right lower extremity has hypersensitivity at the midpoint. However, the incision is closed. There is minimal edema. Motor strength is 5/5 bilateral deltoid, biceps, triceps, grip 4/5 bilateral hip flexors, right lateral hip adductor's and bilateral hip abductor's. Knee extension is to minus 5 on the right and to neutral on the left. Knee flexion is to 130  bilaterally        Assessment & Plan:  1. Bilateral BKA, left side performed in March 2018, right side performed in May 2018 Starting home health therapy, recommend completing this and then waiting until she receives her prosthetics before she starts outpatient therapy. Prognostic indicators for independent ambulation are poor. However, she may use the  prosthetics for independent transfers.  Physical medicine and rehabilitation follow-up on a when necessary basis  Vascular surgery will be following up on prosthetics  Primary care is prescribing pain medications, patient is wondering about any changes. Her pain is mainly at night in the right stump. Recommend trying to tramadol at night with one Tylenol. If this is helpful. Ask her primary care to continue prescribing in this manner.

## 2016-08-25 DIAGNOSIS — E1151 Type 2 diabetes mellitus with diabetic peripheral angiopathy without gangrene: Secondary | ICD-10-CM | POA: Diagnosis not present

## 2016-08-25 DIAGNOSIS — E1122 Type 2 diabetes mellitus with diabetic chronic kidney disease: Secondary | ICD-10-CM | POA: Diagnosis not present

## 2016-08-25 DIAGNOSIS — Z4781 Encounter for orthopedic aftercare following surgical amputation: Secondary | ICD-10-CM | POA: Diagnosis not present

## 2016-08-25 DIAGNOSIS — I5042 Chronic combined systolic (congestive) and diastolic (congestive) heart failure: Secondary | ICD-10-CM | POA: Diagnosis not present

## 2016-08-25 DIAGNOSIS — E876 Hypokalemia: Secondary | ICD-10-CM | POA: Diagnosis not present

## 2016-08-25 DIAGNOSIS — N2581 Secondary hyperparathyroidism of renal origin: Secondary | ICD-10-CM | POA: Diagnosis not present

## 2016-08-25 DIAGNOSIS — D631 Anemia in chronic kidney disease: Secondary | ICD-10-CM | POA: Diagnosis not present

## 2016-08-25 DIAGNOSIS — N186 End stage renal disease: Secondary | ICD-10-CM | POA: Diagnosis not present

## 2016-08-25 DIAGNOSIS — I132 Hypertensive heart and chronic kidney disease with heart failure and with stage 5 chronic kidney disease, or end stage renal disease: Secondary | ICD-10-CM | POA: Diagnosis not present

## 2016-08-25 DIAGNOSIS — L89152 Pressure ulcer of sacral region, stage 2: Secondary | ICD-10-CM | POA: Diagnosis not present

## 2016-08-25 DIAGNOSIS — E118 Type 2 diabetes mellitus with unspecified complications: Secondary | ICD-10-CM | POA: Diagnosis not present

## 2016-08-26 DIAGNOSIS — I5042 Chronic combined systolic (congestive) and diastolic (congestive) heart failure: Secondary | ICD-10-CM | POA: Diagnosis not present

## 2016-08-26 DIAGNOSIS — L89152 Pressure ulcer of sacral region, stage 2: Secondary | ICD-10-CM | POA: Diagnosis not present

## 2016-08-26 DIAGNOSIS — Z4781 Encounter for orthopedic aftercare following surgical amputation: Secondary | ICD-10-CM | POA: Diagnosis not present

## 2016-08-26 DIAGNOSIS — E1151 Type 2 diabetes mellitus with diabetic peripheral angiopathy without gangrene: Secondary | ICD-10-CM | POA: Diagnosis not present

## 2016-08-26 DIAGNOSIS — E1122 Type 2 diabetes mellitus with diabetic chronic kidney disease: Secondary | ICD-10-CM | POA: Diagnosis not present

## 2016-08-26 DIAGNOSIS — I132 Hypertensive heart and chronic kidney disease with heart failure and with stage 5 chronic kidney disease, or end stage renal disease: Secondary | ICD-10-CM | POA: Diagnosis not present

## 2016-08-27 ENCOUNTER — Emergency Department (HOSPITAL_COMMUNITY): Payer: Medicare Other

## 2016-08-27 ENCOUNTER — Encounter (HOSPITAL_COMMUNITY): Payer: Self-pay | Admitting: Emergency Medicine

## 2016-08-27 ENCOUNTER — Inpatient Hospital Stay (HOSPITAL_COMMUNITY)
Admission: EM | Admit: 2016-08-27 | Discharge: 2016-08-29 | DRG: 291 | Discharge status: Against Medical Advice | Payer: Medicare Other | Attending: Internal Medicine | Admitting: Internal Medicine

## 2016-08-27 DIAGNOSIS — J90 Pleural effusion, not elsewhere classified: Secondary | ICD-10-CM | POA: Diagnosis not present

## 2016-08-27 DIAGNOSIS — Z89511 Acquired absence of right leg below knee: Secondary | ICD-10-CM

## 2016-08-27 DIAGNOSIS — D631 Anemia in chronic kidney disease: Secondary | ICD-10-CM | POA: Diagnosis not present

## 2016-08-27 DIAGNOSIS — Z944 Liver transplant status: Secondary | ICD-10-CM

## 2016-08-27 DIAGNOSIS — Z823 Family history of stroke: Secondary | ICD-10-CM

## 2016-08-27 DIAGNOSIS — Z809 Family history of malignant neoplasm, unspecified: Secondary | ICD-10-CM

## 2016-08-27 DIAGNOSIS — Z952 Presence of prosthetic heart valve: Secondary | ICD-10-CM | POA: Diagnosis not present

## 2016-08-27 DIAGNOSIS — J810 Acute pulmonary edema: Secondary | ICD-10-CM | POA: Diagnosis not present

## 2016-08-27 DIAGNOSIS — I120 Hypertensive chronic kidney disease with stage 5 chronic kidney disease or end stage renal disease: Secondary | ICD-10-CM | POA: Diagnosis not present

## 2016-08-27 DIAGNOSIS — R0902 Hypoxemia: Secondary | ICD-10-CM | POA: Diagnosis present

## 2016-08-27 DIAGNOSIS — Z833 Family history of diabetes mellitus: Secondary | ICD-10-CM

## 2016-08-27 DIAGNOSIS — J9601 Acute respiratory failure with hypoxia: Secondary | ICD-10-CM | POA: Diagnosis not present

## 2016-08-27 DIAGNOSIS — Z681 Body mass index (BMI) 19 or less, adult: Secondary | ICD-10-CM

## 2016-08-27 DIAGNOSIS — M1990 Unspecified osteoarthritis, unspecified site: Secondary | ICD-10-CM | POA: Diagnosis present

## 2016-08-27 DIAGNOSIS — R791 Abnormal coagulation profile: Secondary | ICD-10-CM | POA: Diagnosis present

## 2016-08-27 DIAGNOSIS — Z951 Presence of aortocoronary bypass graft: Secondary | ICD-10-CM

## 2016-08-27 DIAGNOSIS — Z7901 Long term (current) use of anticoagulants: Secondary | ICD-10-CM

## 2016-08-27 DIAGNOSIS — J449 Chronic obstructive pulmonary disease, unspecified: Secondary | ICD-10-CM | POA: Diagnosis present

## 2016-08-27 DIAGNOSIS — R069 Unspecified abnormalities of breathing: Secondary | ICD-10-CM | POA: Diagnosis not present

## 2016-08-27 DIAGNOSIS — E1140 Type 2 diabetes mellitus with diabetic neuropathy, unspecified: Secondary | ICD-10-CM | POA: Diagnosis present

## 2016-08-27 DIAGNOSIS — Z8249 Family history of ischemic heart disease and other diseases of the circulatory system: Secondary | ICD-10-CM

## 2016-08-27 DIAGNOSIS — I5033 Acute on chronic diastolic (congestive) heart failure: Secondary | ICD-10-CM | POA: Diagnosis not present

## 2016-08-27 DIAGNOSIS — F17210 Nicotine dependence, cigarettes, uncomplicated: Secondary | ICD-10-CM | POA: Diagnosis present

## 2016-08-27 DIAGNOSIS — M549 Dorsalgia, unspecified: Secondary | ICD-10-CM | POA: Diagnosis present

## 2016-08-27 DIAGNOSIS — Z9049 Acquired absence of other specified parts of digestive tract: Secondary | ICD-10-CM

## 2016-08-27 DIAGNOSIS — N186 End stage renal disease: Secondary | ICD-10-CM | POA: Diagnosis not present

## 2016-08-27 DIAGNOSIS — E1122 Type 2 diabetes mellitus with diabetic chronic kidney disease: Secondary | ICD-10-CM | POA: Diagnosis present

## 2016-08-27 DIAGNOSIS — I2510 Atherosclerotic heart disease of native coronary artery without angina pectoris: Secondary | ICD-10-CM | POA: Diagnosis present

## 2016-08-27 DIAGNOSIS — N2581 Secondary hyperparathyroidism of renal origin: Secondary | ICD-10-CM | POA: Diagnosis not present

## 2016-08-27 DIAGNOSIS — E039 Hypothyroidism, unspecified: Secondary | ICD-10-CM | POA: Diagnosis present

## 2016-08-27 DIAGNOSIS — Z992 Dependence on renal dialysis: Secondary | ICD-10-CM

## 2016-08-27 DIAGNOSIS — Z89512 Acquired absence of left leg below knee: Secondary | ICD-10-CM

## 2016-08-27 DIAGNOSIS — E8889 Other specified metabolic disorders: Secondary | ICD-10-CM | POA: Diagnosis present

## 2016-08-27 DIAGNOSIS — E669 Obesity, unspecified: Secondary | ICD-10-CM | POA: Diagnosis present

## 2016-08-27 DIAGNOSIS — I132 Hypertensive heart and chronic kidney disease with heart failure and with stage 5 chronic kidney disease, or end stage renal disease: Principal | ICD-10-CM | POA: Diagnosis present

## 2016-08-27 DIAGNOSIS — G8929 Other chronic pain: Secondary | ICD-10-CM | POA: Diagnosis present

## 2016-08-27 DIAGNOSIS — K219 Gastro-esophageal reflux disease without esophagitis: Secondary | ICD-10-CM | POA: Diagnosis present

## 2016-08-27 LAB — COMPREHENSIVE METABOLIC PANEL
ALBUMIN: 2.8 g/dL — AB (ref 3.5–5.0)
ALT: 24 U/L (ref 14–54)
AST: 44 U/L — AB (ref 15–41)
Alkaline Phosphatase: 314 U/L — ABNORMAL HIGH (ref 38–126)
Anion gap: 12 (ref 5–15)
BUN: 31 mg/dL — ABNORMAL HIGH (ref 6–20)
CHLORIDE: 92 mmol/L — AB (ref 101–111)
CO2: 27 mmol/L (ref 22–32)
CREATININE: 6.22 mg/dL — AB (ref 0.44–1.00)
Calcium: 10 mg/dL (ref 8.9–10.3)
GFR calc Af Amer: 8 mL/min — ABNORMAL LOW (ref >60)
GFR calc non Af Amer: 7 mL/min — ABNORMAL LOW (ref >60)
Glucose, Bld: 165 mg/dL — ABNORMAL HIGH (ref 65–99)
Potassium: 6.1 mmol/L — ABNORMAL HIGH (ref 3.5–5.1)
SODIUM: 131 mmol/L — AB (ref 135–145)
Total Bilirubin: 2.2 mg/dL — ABNORMAL HIGH (ref 0.3–1.2)
Total Protein: 9.8 g/dL — ABNORMAL HIGH (ref 6.5–8.1)

## 2016-08-27 LAB — BRAIN NATRIURETIC PEPTIDE

## 2016-08-27 LAB — CBC WITH DIFFERENTIAL/PLATELET
BASOS ABS: 0 10*3/uL (ref 0.0–0.1)
BASOS PCT: 1 %
EOS ABS: 0.2 10*3/uL (ref 0.0–0.7)
EOS PCT: 3 %
HCT: 38.5 % (ref 36.0–46.0)
Hemoglobin: 12.9 g/dL (ref 12.0–15.0)
Lymphocytes Relative: 40 %
Lymphs Abs: 2.6 10*3/uL (ref 0.7–4.0)
MCH: 29.2 pg (ref 26.0–34.0)
MCHC: 33.5 g/dL (ref 30.0–36.0)
MCV: 87.1 fL (ref 78.0–100.0)
Monocytes Absolute: 0.5 10*3/uL (ref 0.1–1.0)
Monocytes Relative: 7 %
Neutro Abs: 3.2 10*3/uL (ref 1.7–7.7)
Neutrophils Relative %: 49 %
PLATELETS: 193 10*3/uL (ref 150–400)
RBC: 4.42 MIL/uL (ref 3.87–5.11)
RDW: 16.8 % — ABNORMAL HIGH (ref 11.5–15.5)
WBC: 6.5 10*3/uL (ref 4.0–10.5)

## 2016-08-27 LAB — PROTIME-INR
INR: 1.74
Prothrombin Time: 20.5 seconds — ABNORMAL HIGH (ref 11.4–15.2)

## 2016-08-27 LAB — TROPONIN I: Troponin I: 0.16 ng/mL (ref <0.03)

## 2016-08-27 MED ORDER — LIDOCAINE HCL (PF) 1 % IJ SOLN: 5.0000 mL | INTRAMUSCULAR | Status: DC | PRN

## 2016-08-27 MED ORDER — CALCITRIOL 0.25 MCG PO CAPS
ORAL_CAPSULE | ORAL | Status: AC
  Administered 2016-08-27: 0.5 ug via ORAL
  Filled 2016-08-27: qty 2

## 2016-08-27 MED ORDER — CALCITRIOL 0.5 MCG PO CAPS
0.5000 ug | ORAL_CAPSULE | ORAL | Status: DC
  Administered 2016-08-29: 0.5 ug via ORAL

## 2016-08-27 MED ORDER — SODIUM CHLORIDE 0.9 % IV SOLN
250.0000 mL | INTRAVENOUS | Status: DC | PRN
  Administered 2016-08-28: 250 mL via INTRAVENOUS

## 2016-08-27 MED ORDER — DARBEPOETIN ALFA 60 MCG/0.3ML IJ SOSY
PREFILLED_SYRINGE | INTRAMUSCULAR | Status: AC
  Administered 2016-08-27: 60 ug via INTRAVENOUS
  Filled 2016-08-27: qty 0.3

## 2016-08-27 MED ORDER — IPRATROPIUM-ALBUTEROL 0.5-2.5 (3) MG/3ML IN SOLN: 3.0000 mL | RESPIRATORY_TRACT | Status: DC | PRN

## 2016-08-27 MED ORDER — RENA-VITE PO TABS
1.0000 | ORAL_TABLET | Freq: Every day | ORAL | Status: DC
  Administered 2016-08-27 – 2016-08-28 (×2): 1 via ORAL
  Filled 2016-08-27 (×2): qty 1

## 2016-08-27 MED ORDER — BOOST / RESOURCE BREEZE PO LIQD
1.0000 | Freq: Three times a day (TID) | ORAL | Status: DC
  Administered 2016-08-27 – 2016-08-29 (×6): 1 via ORAL

## 2016-08-27 MED ORDER — HEPARIN SODIUM (PORCINE) 1000 UNIT/ML DIALYSIS
1000.0000 [IU] | INTRAMUSCULAR | Status: DC | PRN
  Filled 2016-08-27: qty 1

## 2016-08-27 MED ORDER — ONDANSETRON 4 MG PO TBDP: 4.0000 mg | ORAL_TABLET | Freq: Three times a day (TID) | ORAL | Status: DC | PRN

## 2016-08-27 MED ORDER — WARFARIN SODIUM 2.5 MG PO TABS
2.5000 mg | ORAL_TABLET | ORAL | Status: DC
  Administered 2016-08-27: 2.5 mg via ORAL
  Filled 2016-08-27: qty 1

## 2016-08-27 MED ORDER — CARVEDILOL 3.125 MG PO TABS
3.1250 mg | ORAL_TABLET | Freq: Two times a day (BID) | ORAL | Status: DC
  Administered 2016-08-27 – 2016-08-29 (×2): 3.125 mg via ORAL
  Filled 2016-08-27 (×2): qty 1

## 2016-08-27 MED ORDER — CALCIUM ACETATE (PHOS BINDER) 667 MG PO CAPS
667.0000 mg | ORAL_CAPSULE | Freq: Three times a day (TID) | ORAL | Status: DC
  Administered 2016-08-27 – 2016-08-29 (×4): 667 mg via ORAL
  Filled 2016-08-27 (×5): qty 1

## 2016-08-27 MED ORDER — NITROGLYCERIN 0.4 MG SL SUBL: 0.4000 mg | SUBLINGUAL_TABLET | SUBLINGUAL | Status: DC | PRN

## 2016-08-27 MED ORDER — TACROLIMUS 1 MG PO CAPS
2.0000 mg | ORAL_CAPSULE | Freq: Two times a day (BID) | ORAL | Status: DC
  Administered 2016-08-27 – 2016-08-29 (×4): 2 mg via ORAL
  Filled 2016-08-27 (×5): qty 2

## 2016-08-27 MED ORDER — WARFARIN SODIUM 2.5 MG PO TABS: 1.2500 mg | ORAL_TABLET | ORAL | Status: DC

## 2016-08-27 MED ORDER — CALCITRIOL 0.5 MCG PO CAPS
0.5000 ug | ORAL_CAPSULE | ORAL | Status: DC
  Administered 2016-08-27: 0.5 ug via ORAL
  Filled 2016-08-27 (×2): qty 1

## 2016-08-27 MED ORDER — WARFARIN 1.25 MG HALF TABLET: 1.2500 mg | ORAL_TABLET | Freq: Once | ORAL | Status: DC

## 2016-08-27 MED ORDER — FENTANYL CITRATE (PF) 100 MCG/2ML IJ SOLN
50.0000 ug | Freq: Once | INTRAMUSCULAR | Status: AC
  Administered 2016-08-27: 50 ug via INTRAVENOUS
  Filled 2016-08-27: qty 2

## 2016-08-27 MED ORDER — PENTAFLUOROPROP-TETRAFLUOROETH EX AERO
1.0000 "application " | INHALATION_SPRAY | CUTANEOUS | Status: DC | PRN
  Filled 2016-08-27: qty 30

## 2016-08-27 MED ORDER — METHOCARBAMOL 500 MG PO TABS: 250.0000 mg | ORAL_TABLET | Freq: Four times a day (QID) | ORAL | Status: DC | PRN

## 2016-08-27 MED ORDER — VANCOMYCIN HCL IN DEXTROSE 1-5 GM/200ML-% IV SOLN
1000.0000 mg | Freq: Once | INTRAVENOUS | Status: AC
  Administered 2016-08-27: 1000 mg via INTRAVENOUS

## 2016-08-27 MED ORDER — SODIUM CHLORIDE 0.9% FLUSH
3.0000 mL | Freq: Two times a day (BID) | INTRAVENOUS | Status: DC
  Administered 2016-08-27: 3 mL via INTRAVENOUS

## 2016-08-27 MED ORDER — DARBEPOETIN ALFA 60 MCG/0.3ML IJ SOSY
60.0000 ug | PREFILLED_SYRINGE | INTRAMUSCULAR | Status: DC
  Administered 2016-08-27: 60 ug via INTRAVENOUS

## 2016-08-27 MED ORDER — SODIUM CHLORIDE 0.9 % IV SOLN
100.0000 mL | INTRAVENOUS | Status: DC | PRN
  Administered 2016-08-28: 100 mL via INTRAVENOUS

## 2016-08-27 MED ORDER — OXYCODONE HCL 5 MG PO TABS
5.0000 mg | ORAL_TABLET | ORAL | Status: DC | PRN
  Administered 2016-08-27 – 2016-08-29 (×7): 5 mg via ORAL
  Filled 2016-08-27 (×6): qty 1

## 2016-08-27 MED ORDER — SODIUM CHLORIDE 0.9% FLUSH
3.0000 mL | INTRAVENOUS | Status: DC | PRN
  Administered 2016-08-28: 3 mL via INTRAVENOUS
  Filled 2016-08-27: qty 3

## 2016-08-27 MED ORDER — PIPERACILLIN-TAZOBACTAM 3.375 G IVPB
3.3750 g | Freq: Once | INTRAVENOUS | Status: AC
  Administered 2016-08-27: 3.375 g via INTRAVENOUS
  Filled 2016-08-27: qty 50

## 2016-08-27 MED ORDER — INSULIN ASPART 100 UNIT/ML ~~LOC~~ SOLN: 0.0000 [IU] | Freq: Every day | SUBCUTANEOUS | Status: DC

## 2016-08-27 MED ORDER — ALTEPLASE 2 MG IJ SOLR: 2.0000 mg | Freq: Once | INTRAMUSCULAR | Status: DC | PRN

## 2016-08-27 MED ORDER — LORAZEPAM 2 MG/ML IJ SOLN
0.5000 mg | Freq: Once | INTRAMUSCULAR | Status: AC
  Administered 2016-08-27: 0.5 mg via INTRAVENOUS
  Filled 2016-08-27: qty 1

## 2016-08-27 MED ORDER — INSULIN ASPART 100 UNIT/ML ~~LOC~~ SOLN
0.0000 [IU] | Freq: Three times a day (TID) | SUBCUTANEOUS | Status: DC
  Administered 2016-08-28: 3 [IU] via SUBCUTANEOUS

## 2016-08-27 MED ORDER — LEVOTHYROXINE SODIUM 125 MCG PO TABS
175.0000 ug | ORAL_TABLET | Freq: Every day | ORAL | Status: DC
  Administered 2016-08-28 – 2016-08-29 (×2): 175 ug via ORAL
  Filled 2016-08-27 (×4): qty 1

## 2016-08-27 MED ORDER — METOCLOPRAMIDE HCL 5 MG PO TABS
5.0000 mg | ORAL_TABLET | Freq: Two times a day (BID) | ORAL | Status: DC
  Administered 2016-08-27 – 2016-08-29 (×4): 5 mg via ORAL
  Filled 2016-08-27 (×4): qty 1

## 2016-08-27 MED ORDER — SENNOSIDES-DOCUSATE SODIUM 8.6-50 MG PO TABS: 1.0000 | ORAL_TABLET | Freq: Every evening | ORAL | Status: DC | PRN

## 2016-08-27 MED ORDER — SODIUM CHLORIDE 0.9% FLUSH
3.0000 mL | Freq: Two times a day (BID) | INTRAVENOUS | Status: DC
  Administered 2016-08-27 – 2016-08-28 (×2): 3 mL via INTRAVENOUS

## 2016-08-27 MED ORDER — PRO-STAT SUGAR FREE PO LIQD
30.0000 mL | Freq: Two times a day (BID) | ORAL | Status: DC
  Filled 2016-08-27 (×3): qty 30

## 2016-08-27 MED ORDER — IPRATROPIUM-ALBUTEROL 0.5-2.5 (3) MG/3ML IN SOLN
3.0000 mL | RESPIRATORY_TRACT | Status: DC
  Administered 2016-08-27 (×2): 3 mL via RESPIRATORY_TRACT
  Filled 2016-08-27 (×2): qty 3

## 2016-08-27 MED ORDER — ASPIRIN EC 81 MG PO TBEC
81.0000 mg | DELAYED_RELEASE_TABLET | Freq: Every day | ORAL | Status: DC
  Administered 2016-08-27 – 2016-08-29 (×3): 81 mg via ORAL
  Filled 2016-08-27 (×3): qty 1

## 2016-08-27 MED ORDER — WARFARIN - PHYSICIAN DOSING INPATIENT
Freq: Every day | Status: DC
  Administered 2016-08-27: 18:00:00

## 2016-08-27 MED ORDER — SODIUM CHLORIDE 0.9 % IV SOLN: 100.0000 mL | INTRAVENOUS | Status: DC | PRN

## 2016-08-27 MED ORDER — VANCOMYCIN HCL IN DEXTROSE 1-5 GM/200ML-% IV SOLN
INTRAVENOUS | Status: AC
  Administered 2016-08-27: 1000 mg via INTRAVENOUS
  Filled 2016-08-27: qty 200

## 2016-08-27 MED ORDER — LIDOCAINE-PRILOCAINE 2.5-2.5 % EX CREA
1.0000 "application " | TOPICAL_CREAM | CUTANEOUS | Status: DC | PRN
  Filled 2016-08-27: qty 5

## 2016-08-27 NOTE — H&P (Signed)
History and Physical    [name] TKW:[id number] DOB: 09-Dec-1955 DOA: 08/27/2016  PCP:  Hollie Salk Patient coming from home  Chief Complaint: Shortness of Breath, Renal Failure  HPI: [name] is a 61 y.o. female with medical history significant of ESRF on Dialysis MWF in [place], [place], mechanical aortic valve, type 2 diabtes, CAD and bil BKA  Presented to the Emergency department today sith acute shortness of breath.  Pt reports she had dialyisis on Monday but did not get to her dry weight.  Pt reports they miscalculated the weight of her wheelchair. Pt was seen by Dr. Hollie Salk with Nephrology and placed on dialysis.  Pt reports she is breathing better now.   Pt denies cough or congestion.  No fever or chills    ED Course: Pt evaluated in ED and sent for emergent hemodialysis   Review of Systems: As per HPI otherwise all other systems reviewed and are negative   Ambulatory Status: wheelchair bound.  Past Medical History:  Diagnosis Date  . Anemia    takes Folic Acid daily  . Anxiety   . Aortic stenosis   . Arthritis    "left hand, back" (08/30/2012)  . Asthma   . CAD (coronary artery disease)   . CAD (coronary artery disease) Jan. 2015   Cath: 20% LAD, 50% D1; s/p LIMA-LAD  . Chest pain 03/06/2015  . CHF (congestive heart failure) ([place]) 05/2016  . Chronic back pain   . Chronic bronchitis ([place])    "q yr w/season changes" (08/30/2012)  . Chronic constipation    takes MIralax and Colace daily  . COPD (chronic obstructive pulmonary disease) ([place])   . Depression    takes Cymbalta for "severe" depression  . End stage renal disease on dialysis [facility]) 02/27/2011   Kidneys shut down at time of liver transplant in Sept 2011 at [facility] in [place], she has been on HD ever since.  Dialyzes at [facility] HD on TTS schedule.  Had L forearm graft used 10 months then removed Dec 2012 due to suspected infection.  A right upper arm AVG was placed Dec 2012 but she developed steal symptoms acutely  and it was ligated the same day.  Never had an AV fistula due to small veins.  Now has L thigh AVG put in Jan 2013, has not clotted to date.    [name] ESRD on dialysis [facility])    "MWF; [facility]" (08/12/2016)  . GERD (gastroesophageal reflux disease)    takes Omeprazole daily  . Headache    "at least monthly" (08/30/2012)  . Hepatitis C   . History of blood transfusion    "several" (08/30/2012)  . Hypertension    takes Metoprolol and Lisinopril daily, sees Dr Bea Graff  . Hypothyroidism    takes Synthroid daily  . Migraine    "last migraine was in 2013" (08/30/2012)  . Neuromuscular disorder ([place])    carpal tunnel in right hand  . Obesity   . Peripheral vascular disease (HCC) hands and legs  . Pneumonia    "today and several times before" (08/30/2012)  . 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 S/P unilateral BKA (below knee amputation), left ([place]) 04/30/2016  . SVT (supraventricular tachycardia) ([place]) 06/09/14  . Tobacco abuse   . Type II diabetes mellitus ([place])  Levemir 2units daily if > 150    Past Surgical History:  Procedure Laterality Date  . ABDOMINAL AORTOGRAM W/LOWER EXTREMITY N/A 07/04/2016   Procedure: Abdominal Aortogram w/Lower Extremity;  Surgeon: Elam Dutch, MD;  Location: [place] CV LAB;  Service: Cardiovascular;  Laterality: N/A;  . AMPUTATION Left 04/24/2016   Procedure: AMPUTATION BELOW KNEE;  Surgeon: Angelia Mould, MD;  Location: [place];  Service: Vascular;  Laterality: Left;  . AMPUTATION Right 07/08/2016   Procedure: AMPUTATION BELOW KNEE;  Surgeon: Angelia Mould, MD;  Location: [place];  Service: Vascular;  Laterality: Right;  . AORTIC VALVE REPLACEMENT N/A 03/15/2013   AVR; Surgeon: Ivin Poot, MD;  Location: [facility] OR; Open Heart Surgery;  19mm[employer] mechanical prosthesis, top hat valve  . ARTERIOVENOUS  GRAFT PLACEMENT Left 10/03/10    forearm  . AV FISTULA PLACEMENT  01/29/2011   Procedure: INSERTION OF ARTERIOVENOUS (AV) GORE-TEX GRAFT ARM;  Surgeon: Elam Dutch, MD;  Location: [facility] OR;  Service: Vascular;  Laterality: Right;  . AV FISTULA PLACEMENT  03/10/2011   Procedure: INSERTION OF ARTERIOVENOUS (AV) GORE-TEX GRAFT THIGH;  Surgeon: Elam Dutch, MD;  Location: [place];  Service: Vascular;  Laterality: Left;  . AV FISTULA PLACEMENT Left 07/21/2016   Procedure: CREATION OF LEFT UPPER ARM ARTERIOVENOUS (AV) FISTULA;  Surgeon: Conrad [place]cher[place], MD;  Location: [place];  Service: Vascular;  Laterality: Left;  . [place] REMOVAL  12/23/2010   Procedure: REMOVAL OF ARTERIOVENOUS GORETEX GRAFT ([place]);  Surgeon: Elam Dutch, MD;  Location: [place];  Service: Vascular;  Laterality: Left;  procedure started @1736 -5573  . CHOLECYSTECTOMY  1993  . CORONARY ANGIOGRAPHY N/A 08/12/2016   Procedure: CORONARY ANGIOGRAPHY (CATH LAB);  Surgeon: Leonie Man, MD;  Location: [place] CV LAB;  Service: Cardiovascular;  Laterality: N/A;  . CORONARY ARTERY BYPASS GRAFT N/A 03/15/2013   Procedure: CORONARY ARTERY BYPASS GRAFTING (CABG) times one using left internal mammary artery.;  Surgeon: Ivin Poot, MD;  Location: [place];  Service: Open Heart Surgery;  Laterality: N/A;  POSS CABG X 1  . CYSTOSCOPY  1990's  . INSERTION OF DIALYSIS CATHETER  12/23/2010   Procedure: INSERTION OF DIALYSIS CATHETER;  Surgeon: Elam Dutch, MD;  Location: [place];  Service: Vascular;  Laterality: Right;  Right Internal Jugular 28cm dialysis catheter insertion procedure time 1701-1720   . INSERTION OF DIALYSIS CATHETER Right 03/11/2016   Procedure: INSERTION OF DIALYSIS CATHETER;  Surgeon: Angelia Mould, MD;  Location: [place];  Service: Vascular;  Laterality: Right;  . INTRAOPERATIVE TRANSESOPHAGEAL ECHOCARDIOGRAM N/A 03/15/2013   Procedure: INTRAOPERATIVE TRANSESOPHAGEAL ECHOCARDIOGRAM;  Surgeon: Ivin Poot, MD;   Location: [place];  Service: Open Heart Surgery;  Laterality: N/A;  . LEFT HEART CATHETERIZATION WITH CORONARY ANGIOGRAM N/A 07/29/2012   Procedure: LEFT HEART CATHETERIZATION WITH CORONARY ANGIOGRAM;  Surgeon: Troy Sine, MD;  Location: [facility] CATH LAB;  Service: Cardiovascular;  Laterality: N/A;  . LEFT HEART CATHETERIZATION WITH CORONARY ANGIOGRAM N/A 03/10/2013   Procedure: LEFT HEART CATHETERIZATION WITH CORONARY ANGIOGRAM;  Surgeon: Troy Sine, MD;  Location: [facility] CATH LAB;  Service: Cardiovascular;  Laterality: N/A;  . LEFT HEART CATHETERIZATION WITH CORONARY/GRAFT ANGIOGRAM N/A 12/24/2011   Procedure: LEFT HEART CATHETERIZATION WITH Beatrix Fetters;  Surgeon: [name], MD;  Location: [facility] CATH LAB;  Service: Cardiovascular;  Laterality: N/A;  . LEFT HEART CATHETERIZATION WITH CORONARY/GRAFT ANGIOGRAM N/A 12/16/2013   Procedure: LEFT HEART CATHETERIZATION WITH CORONARY/GRAFT ANGIOGRAM;  Surgeon: Troy Sine, MD;  Location: [facility] CATH LAB;  Service: Cardiovascular;  Laterality: N/A;  . LIGATION ARTERIOVENOUS GORTEX GRAFT Left 03/11/2016   Procedure: LIGATION THIGH ARTERIOVENOUS GORTEX GRAFT;  Surgeon: Angelia Mould, MD;  Location: [place];  Service: Vascular;  Laterality: Left;  . LIGATION OF ARTERIOVENOUS  FISTULA Left 08/11/2016   Procedure: LIGATION OF ARTERIOVENOUS  FISTULA;  Surgeon: Elam Dutch, MD;  Location: [place];  Service: Vascular;  Laterality: Left;  . LIVER TRANSPLANT  10/25/2009   sees Dr Ferol Luz 1 every 6 months, saw last in Dec 2013. [name] Coord [phone]  . PERIPHERAL VASCULAR CATHETERIZATION N/A 11/06/2015   Procedure: Abdominal Aortogram;  Surgeon: Serafina Mitchell, MD;  Location: [place] CV LAB;  Service: Cardiovascular;  Laterality: N/A;  . PERIPHERAL VASCULAR CATHETERIZATION N/A 11/06/2015   Procedure: Lower Extremity Angiography;  Surgeon: Serafina Mitchell, MD;  Location: [facility] CV LAB;  Service: Cardiovascular;  Laterality: N/A;  .  PERIPHERAL VASCULAR CATHETERIZATION  11/06/2015   Procedure: Peripheral Vascular Atherectomy;  Surgeon: Serafina Mitchell, MD;  Location: [place] CV LAB;  Service: Cardiovascular;;  Left Superficial femoral  . SHUNTOGRAM Left 05/15/2014   Procedure: SHUNTOGRAM;  Surgeon: Conrad [place], MD;  Location: [facility] CATH LAB;  Service: Cardiovascular;  Laterality: Left;  . SMALL INTESTINE SURGERY  90's  . SPINAL GROWTH RODS  2010   "put 2 metal rods in my back; they had detetriorated" (08/30/2012)  . THROMBECTOMY    . THROMBECTOMY AND REVISION OF ARTERIOVENTOUS (AV) GORETEX  GRAFT Left 03/30/2014  . THROMBECTOMY AND REVISION OF ARTERIOVENTOUS (AV) GORETEX  GRAFT Left 03/30/2014   Procedure: THROMBECTOMY AND REVISION OF ARTERIOVENTOUS (AV) GORETEX  GRAFT;  Surgeon: Conrad Dotsero, MD;  Location: [place];  Service: Vascular;  Laterality: Left;  . TUBAL LIGATION  1990's    Social History   Social History  . Marital status: Divorced    Spouse name: N/A  . Number of children: N/A  . Years of education: N/A   Occupational History  . Not on file.   Social History Main Topics  . Smoking status: Current Every Day Smoker    Packs/day: 0.33    Years: 43.00    Types: Cigarettes  . Smokeless tobacco: Never Used  . Alcohol use No  . Drug use: No  . Sexual activity: No   Other Topics Concern  . Not on file   Social History Narrative  . No narrative on file    Allergies  Allergen Reactions  . Acetaminophen Other (See Comments)    Liver transplant recipient   . Codeine Itching  . Mirtazapine Other (See Comments)    hallucination    Family History  Problem Relation Age of Onset  . Cancer Mother   . Diabetes Mother   . Hypertension Mother   . Stroke Mother   . Cancer Father   . Anesthesia problems Neg Hx   . Hypotension Neg Hx   . Malignant hyperthermia Neg Hx   . Pseudochol deficiency Neg Hx     Prior to Admission medications   Medication Sig Start Date End Date Taking? Authorizing Provider    aspirin EC 81 MG tablet Take 81 mg by mouth daily at 12 noon.     [provider]  calcitRIOL (ROCALTROL) 0.5 MCG capsule Take 1 capsule (0.5 mcg total) by mouth every Monday, Wednesday, and Friday with hemodialysis. 07/25/16   Angiulli, Lavon Paganini, PA-C  calcium acetate (PHOSLO)  667 MG capsule Take 1 capsule (667 mg total) by mouth 3 (three) times daily with meals. 07/24/16   Angiulli, Lavon Paganini, PA-C  carvedilol (COREG) 3.125 MG tablet Take 1 tablet (3.125 mg total) by mouth 2 (two) times daily with a meal. 08/13/16   Rai, Ripudeep K, MD  feeding supplement (BOOST / RESOURCE BREEZE) LIQD Take 1 Container by mouth 3 (three) times daily between meals. 04/25/16   Reyne Dumas, MD  levothyroxine (SYNTHROID, LEVOTHROID) 175 MCG tablet Take 1 tablet (175 mcg total) by mouth daily at 12 noon. 07/24/16   Angiulli, Lavon Paganini, PA-C  methocarbamol (ROBAXIN) 500 MG tablet Take 0.5 tablets (250 mg total) by mouth every 6 (six) hours as needed for muscle spasms. 07/24/16   Angiulli, Lavon Paganini, PA-C  metoCLOPramide (REGLAN) 5 MG tablet Take 1 tablet (5 mg total) by mouth 2 (two) times daily before a meal. Decrease to once a day before breakfast after a couple of weeks. 07/24/16   Angiulli, Lavon Paganini, PA-C  multivitamin (RENA-VIT) TABS tablet Take 1 tablet by mouth at bedtime. 05/20/16   Love, Ivan Anchors, PA-C  nitroGLYCERIN (NITROSTAT) 0.4 MG SL tablet Place 1 tablet (0.4 mg total) under the tongue every 5 (five) minutes as needed for chest pain. 03/20/14   Elgergawy, Silver Huguenin, MD  ondansetron (ZOFRAN-ODT) 4 MG disintegrating tablet Take 1 tablet (4 mg total) by mouth every 8 (eight) hours as needed for nausea or vomiting. 08/13/16   Rai, Ripudeep K, MD  tacrolimus (PROGRAF) 1 MG capsule Take 2 mg by mouth 2 (two) times daily.    [provider]  warfarin (COUMADIN) 2.5 MG tablet Take 1.25-2.5 mg by mouth. Take 1.25 mg (1/2 tablet) daily except 2.5 mg (1 tablet) on Tues, Thurs, Sun    [provider]     Physical Exam: Vitals:   08/27/16 0820 08/27/16 0830 08/27/16 0900 08/27/16 0930  BP: (!) 154/97 (!) 171/92 (!) 179/102 (!) 159/88  Pulse: 88 77 87 87  Resp: (!) 8 15 20  (!) 26  Temp: 98.9 F (37.2 C)     TempSrc: Oral        General:  Appears calm and comfortable Eyes: PERRL, EOMI, normal lids, iris ENT:  grossly normal hearing, lips & tongue, mmm Neck:  no LAD, masses or thyromegaly Cardiovascular:  RRR, valve  Respiratory:  Normal respiratory effort.(post dialysis) Abdomen:  soft, ntnd, NABS Skin:  no rash or induration seen on limited exam Musculoskeletal: bilat bka's Psychiatric:  grossly normal mood and affect, speech fluent and appropriate, AOx3 Neurologic: CN 2-12 grossly intact,bilat bka's Labs on Admission: I have personally reviewed following labs and imaging studies  CBC:  Recent Labs Lab 08/27/16 0738  WBC 6.5  NEUTROABS 3.2  HGB 12.9  HCT 38.5  MCV 87.1  PLT 938   Basic Metabolic Panel:  Recent Labs Lab 08/27/16 0738  NA 131*  K 6.1*  CL 92*  CO2 27  GLUCOSE 165*  BUN 31*  CREATININE 6.22*  CALCIUM 10.0   GFR: Estimated Creatinine Clearance: 7.3 mL/min (A) (by C-G formula based on SCr of 6.22 mg/dL (H)). Liver Function Tests:  Recent Labs Lab 08/27/16 0738  AST 44*  ALT 24  ALKPHOS 314*  BILITOT 2.2*  PROT 9.8*  ALBUMIN 2.8*   No results for input(s): LIPASE, AMYLASE in the last 168 hours. No results for input(s): AMMONIA in the last 168 hours. Coagulation Profile:  Recent Labs Lab 08/27/16 0738  INR 1.74   Cardiac  Enzymes:  Recent Labs Lab 08/27/16 0738  TROPONINI 0.16*   BNP (last 3 results) No results for input(s): PROBNP in the last 8760 hours. HbA1C: No results for input(s): HGBA1C in the last 72 hours. CBG: No results for input(s): GLUCAP in the last 168 hours. Lipid Profile: No results for input(s): CHOL, HDL, LDLCALC, TRIG, CHOLHDL, LDLDIRECT in the last 72 hours. Thyroid Function Tests: No results  for input(s): TSH, T4TOTAL, FREET4, T3FREE, THYROIDAB in the last 72 hours. Anemia Panel: No results for input(s): VITAMINB12, FOLATE, FERRITIN, TIBC, IRON, RETICCTPCT in the last 72 hours. Urine analysis:    Component Value Date/Time   COLORURINE YELLOW 11/02/2012 0428   APPEARANCEUR CLOUDY (A) 11/02/2012 0428   LABSPEC 1.011 11/02/2012 0428   PHURINE 8.0 11/02/2012 0428   GLUCOSEU 250 (A) 11/02/2012 0428   HGBUR LARGE (A) 11/02/2012 0428   BILIRUBINUR NEGATIVE 11/02/2012 0428   KETONESUR NEGATIVE 11/02/2012 0428   PROTEINUR >300 (A) 11/02/2012 0428   UROBILINOGEN 0.2 11/02/2012 0428   NITRITE NEGATIVE 11/02/2012 0428   LEUKOCYTESUR NEGATIVE 11/02/2012 0428    Creatinine Clearance: Estimated Creatinine Clearance: 7.3 mL/min (A) (by C-G formula based on SCr of 6.22 mg/dL (H)).  Sepsis Labs: @LABRCNTIP (procalcitonin:4,lacticidven:4) )  Radiological Exams on Admission: Dg Chest 2 View  Result Date: 08/27/2016 CLINICAL DATA:  Dyspnea EXAM: CHEST  2 VIEW COMPARISON:  08/09/2016 FINDINGS: Pulmonary edema and pleural effusions that are small volume. Right pleural effusion is likely partially loculated laterally, also seen previously. Status post aortic valve replacement. Dialysis catheter on the right with tip at the right atrium. No pneumothorax. EKG leads create artifact over the chest. IMPRESSION: Pulmonary edema and small pleural effusions. Electronically Signed   By: Monte Fantasia M.D.   On: 08/27/2016 08:16    EKG: Independently reviewed.   Assessment/Plan Active Problems:   ESRF (end stage renal failure) (HCC)  Pt had emergent dialysis,  Nephrology will continue to follow for serial hemodialysis   Type 2 Diabetes Pt is not currently on any medications  Mechanical Valve replacement Continue coumadin  Hypertension Hold metoprolol      DVT prophylaxis: on coumadin Code Status: no code Family Communication:  Disposition Plan: admit to observation  Consults  called: Nephrology  Dr. Hollie Salk   Admission status: Observation   Alyse Low PA-C [facility] [phone] If 7PM-7AM, please contact night-coverage [email or web address] Password [email or web address]  08/27/2016, 1:07 PM

## 2016-08-27 NOTE — ED Provider Notes (Signed)
[facility] DEPT Provider Note   CSN: [id number] Arrival date & time: 08/27/16  0708     History   Chief Complaint Chief Complaint  Patient presents with  . Shortness of Breath    HPI [name] is a 61 y.o. female.   Shortness of Breath  This is a new problem. The average episode lasts 1 day. The problem occurs continuously.The current episode started 12 to 24 hours ago. The problem has not changed since onset.Pertinent negatives include no fever and no ear pain. She has tried nothing for the symptoms. She has had no prior hospitalizations. She has had no prior ED visits.    Past Medical History:  Diagnosis Date  . Anemia    takes Folic Acid daily  . Anxiety   . Aortic stenosis   . Arthritis    "left hand, back" (08/30/2012)  . Asthma   . CAD (coronary artery disease)   . CAD (coronary artery disease) Jan. 2015   Cath: 20% LAD, 50% D1; s/p LIMA-LAD  . Chest pain 03/06/2015  . CHF (congestive heart failure) ([place]) 05/2016  . Chronic back pain   . Chronic bronchitis ([place])    "q yr w/season changes" (08/30/2012)  . Chronic constipation    takes MIralax and Colace daily  . COPD (chronic obstructive pulmonary disease) ([place])   . Depression    takes Cymbalta for "severe" depression  . End stage renal disease on dialysis [facility]) 02/27/2011   Kidneys shut down at time of liver transplant in Sept 2011 at [facility] in [place], she has been on HD ever since.  Dialyzes at [facility] HD on TTS schedule.  Had L forearm graft used 10 months then removed Dec 2012 due to suspected infection.  A right upper arm AVG was placed Dec 2012 but she developed steal symptoms acutely and it was ligated the same day.  Never had an AV fistula due to small veins.  Now has L thigh AVG put in Jan 2013, has not clotted to date.    [name] ESRD on dialysis [facility])    "MWF; [facility]" (08/12/2016)  . GERD (gastroesophageal reflux disease)    takes Omeprazole daily  . Headache    "at least monthly" (08/30/2012)    . Hepatitis C   . History of blood transfusion    "several" (08/30/2012)  . Hypertension    takes Metoprolol and Lisinopril daily, sees Dr Bea Graff  . Hypothyroidism    takes Synthroid daily  . Migraine    "last migraine was in 2013" (08/30/2012)  . Neuromuscular disorder ([place])    carpal tunnel in right hand  . Obesity   . Peripheral vascular disease (HCC) hands and legs  . Pneumonia    "today and several times before" (08/30/2012)  . 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 S/P unilateral BKA (below knee amputation), left ([place]) 04/30/2016  . SVT (supraventricular tachycardia) ([place]) 06/09/14  . Tobacco abuse   . Type II diabetes mellitus (HCC)    Levemir 2units daily if > 150    Patient Active Problem List   Diagnosis Date Noted  . ESRF (end stage renal failure) ([place]) 08/27/2016  . Protein-calorie malnutrition, severe 08/13/2016  . Acute CHF (congestive heart failure) ([place]) 08/09/2016  . Fluid overload 07/31/2016  . SOB (shortness of breath)   .  Type 2 diabetes mellitus with peripheral neuropathy (HCC)   . H/O mitral valve replacement with mechanical valve   . Amputation of right lower extremity below knee ([place]) 07/10/2016  . History of liver transplant ([place])   . Post-operative pain   . Unilateral complete BKA, right, subsequent encounter ([place])   . S/P bilateral BKA (below knee amputation) ([place])   . Malnutrition of moderate degree 07/01/2016  . Cellulitis 06/30/2016  . PAD (peripheral artery disease) ([place]) 06/30/2016  . Diabetic foot infection ([place]) 06/30/2016  . Pressure injury of skin 05/28/2016  . Volume overload 05/28/2016  . Acute exacerbation of CHF (congestive heart failure) ([place]) 05/27/2016  . Extremity atherosclerosis with resting pain ([place])   . H/O heart valve replacement with mechanical valve   . Phantom limb pain ([place])   .  Subtherapeutic international normalized ratio (INR)   . Supratherapeutic INR   . Anemia of chronic disease   . Chronic midline low back pain without sciatica   . ESRD on dialysis ([place])   . Type 2 diabetes mellitus with complication, with long-term current use of insulin ([place])   . S/P unilateral BKA (below knee amputation), left ([place]) 04/30/2016  . Unilateral complete BKA, left, initial encounter ([place]) 04/30/2016  . Abnormality of gait   . Coronary artery disease involving coronary bypass graft of native heart without angina pectoris   . Benign essential HTN   . History of migraine   . Leukocytosis   . Critical lower limb ischemia 04/22/2016  . Foot pain 04/21/2016  . Fever   . Advance care planning   . Goals of care, counseling/discussion   . Palliative care by specialist   . Sepsis ([place])   . Hypotension   . Claudication of left lower extremity ([place])   . Elevated troponin 03/05/2016  . Pressure injury of skin, stage 2 11/27/2015  . H/O unilateral nephrectomy 07/06/2015  . Paroxysmal SVT (supraventricular tachycardia) ([place]) 06/22/2014  . Complications due to renal dialysis device, implant, and graft 05/11/2014  . Acute blood loss anemia 03/31/2014  . Renal dialysis device, implant, or graft complication 83/15/1761  . Acute hyperkalemia   . S/P liver transplant ([place])   . Chronic hepatitis C without hepatic coma ([place])   . Pulmonary edema 01/03/2014  . Respiratory failure ([place]) 01/03/2014  . Long term current use of anticoagulant therapy 04/22/2013  . Chronic anticoagulation w/ coumadin, goal INR 2.5-3.0 w/ AVR 04/14/2013  . Tobacco abuse 02/28/2013  . COPD (chronic obstructive pulmonary disease) ([place]) 02/21/2013  . Chronic combined systolic and diastolic congestive heart failure ([place]) 02/06/2013  . Acute on chronic respiratory failure with hypoxia ([place]) 11/02/2012  . Acute pulmonary edema ([place]) 11/02/2012  . Acute on chronic systolic CHF (congestive heart failure) ([place])  11/02/2012  . Dyslipidemia-LDL 104, not on statin with Hx of liver transplant 07/30/2012  . CAD (coronary artery disease), native coronary artery 07/27/2012  . History of prosthetic aortic valve replacement   . End stage renal disease on dialysis ([place]) 02/27/2011  . Hypothyroidism 06/25/2006  . Type 2 diabetes mellitus with chronic kidney disease ([place]) 06/25/2006  . GERD 06/25/2006  . Anemia due to chronic renal failure    . Hypertensive heart disease     Past Surgical History:  Procedure Laterality Date  . ABDOMINAL AORTOGRAM W/LOWER EXTREMITY N/A 07/04/2016   Procedure: Abdominal Aortogram w/Lower Extremity;  Surgeon: Elam Dutch, MD;  Location: [place] CV LAB;  Service: Cardiovascular;  Laterality: N/A;  . AMPUTATION Left 04/24/2016  Procedure: AMPUTATION BELOW KNEE;  Surgeon: Angelia Mould, MD;  Location: [place];  Service: Vascular;  Laterality: Left;  . AMPUTATION Right 07/08/2016   Procedure: AMPUTATION BELOW KNEE;  Surgeon: Angelia Mould, MD;  Location: [place];  Service: Vascular;  Laterality: Right;  . AORTIC VALVE REPLACEMENT N/A 03/15/2013   AVR; Surgeon: Ivin Poot, MD;  Location: [facility] OR; Open Heart Surgery;  55mm[employer] mechanical prosthesis, top hat valve  . ARTERIOVENOUS GRAFT PLACEMENT Left 10/03/10    forearm  . AV FISTULA PLACEMENT  01/29/2011   Procedure: INSERTION OF ARTERIOVENOUS (AV) GORE-TEX GRAFT ARM;  Surgeon: Elam Dutch, MD;  Location: [facility] OR;  Service: Vascular;  Laterality: Right;  . AV FISTULA PLACEMENT  03/10/2011   Procedure: INSERTION OF ARTERIOVENOUS (AV) GORE-TEX GRAFT THIGH;  Surgeon: Elam Dutch, MD;  Location: [place];  Service: Vascular;  Laterality: Left;  . AV FISTULA PLACEMENT Left 07/21/2016   Procedure: CREATION OF LEFT UPPER ARM ARTERIOVENOUS (AV) FISTULA;  Surgeon: Conrad Rincon, MD;  Location: [place];  Service: Vascular;  Laterality: Left;  . [place] REMOVAL  12/23/2010   Procedure: REMOVAL OF ARTERIOVENOUS  GORETEX GRAFT ([place]);  Surgeon: Elam Dutch, MD;  Location: [place];  Service: Vascular;  Laterality: Left;  procedure started @1736 -1852  . CHOLECYSTECTOMY  1993  . CORONARY ANGIOGRAPHY N/A 08/12/2016   Procedure: CORONARY ANGIOGRAPHY (CATH LAB);  Surgeon: Leonie Man, MD;  Location: [place] CV LAB;  Service: Cardiovascular;  Laterality: N/A;  . CORONARY ARTERY BYPASS GRAFT N/A 03/15/2013   Procedure: CORONARY ARTERY BYPASS GRAFTING (CABG) times one using left internal mammary artery.;  Surgeon: Ivin Poot, MD;  Location: [facility];  Service: Open Heart Surgery;  Laterality: N/A;  POSS CABG X 1  . CYSTOSCOPY  1990's  . INSERTION OF DIALYSIS CATHETER  12/23/2010   Procedure: INSERTION OF DIALYSIS CATHETER;  Surgeon: Elam Dutch, MD;  Location: [employer];  Service: Vascular;  Laterality: Right;  Right Internal Jugular 28cm dialysis catheter insertion procedure time 1701-1720   . INSERTION OF DIALYSIS CATHETER Right 03/11/2016   Procedure: INSERTION OF DIALYSIS CATHETER;  Surgeon: Angelia Mould, MD;  Location: [place];  Service: Vascular;  Laterality: Right;  . INTRAOPERATIVE TRANSESOPHAGEAL ECHOCARDIOGRAM N/A 03/15/2013   Procedure: INTRAOPERATIVE TRANSESOPHAGEAL ECHOCARDIOGRAM;  Surgeon: Ivin Poot, MD;  Location: [place];  Service: Open Heart Surgery;  Laterality: N/A;  . LEFT HEART CATHETERIZATION WITH CORONARY ANGIOGRAM N/A 07/29/2012   Procedure: LEFT HEART CATHETERIZATION WITH CORONARY ANGIOGRAM;  Surgeon: Troy Sine, MD;  Location: [facility] CATH LAB;  Service: Cardiovascular;  Laterality: N/A;  . LEFT HEART CATHETERIZATION WITH CORONARY ANGIOGRAM N/A 03/10/2013   Procedure: LEFT HEART CATHETERIZATION WITH CORONARY ANGIOGRAM;  Surgeon: Troy Sine, MD;  Location: [facility] CATH LAB;  Service: Cardiovascular;  Laterality: N/A;  . LEFT HEART CATHETERIZATION WITH CORONARY/GRAFT ANGIOGRAM N/A 12/24/2011   Procedure: LEFT HEART CATHETERIZATION WITH Beatrix Fetters;  Surgeon:  [name], MD;  Location: [facility] CATH LAB;  Service: Cardiovascular;  Laterality: N/A;  . LEFT HEART CATHETERIZATION WITH CORONARY/GRAFT ANGIOGRAM N/A 12/16/2013   Procedure: LEFT HEART CATHETERIZATION WITH Beatrix Fetters;  Surgeon: Troy Sine, MD;  Location: [facility] CATH LAB;  Service: Cardiovascular;  Laterality: N/A;  . LIGATION ARTERIOVENOUS GORTEX GRAFT Left 03/11/2016   Procedure: LIGATION THIGH ARTERIOVENOUS GORTEX GRAFT;  Surgeon: Angelia Mould, MD;  Location: [place];  Service: Vascular;  Laterality: Left;  . LIGATION OF ARTERIOVENOUS  FISTULA Left  08/11/2016   Procedure: LIGATION OF ARTERIOVENOUS  FISTULA;  Surgeon: Elam Dutch, MD;  Location: [place];  Service: Vascular;  Laterality: Left;  . LIVER TRANSPLANT  10/25/2009   sees Dr Ferol Luz 1 every 6 months, saw last in Dec 2013. [name] Coord [phone]  . PERIPHERAL VASCULAR CATHETERIZATION N/A 11/06/2015   Procedure: Abdominal Aortogram;  Surgeon: Serafina Mitchell, MD;  Location: [place] CV LAB;  Service: Cardiovascular;  Laterality: N/A;  . PERIPHERAL VASCULAR CATHETERIZATION N/A 11/06/2015   Procedure: Lower Extremity Angiography;  Surgeon: Serafina Mitchell, MD;  Location: [place] CV LAB;  Service: Cardiovascular;  Laterality: N/A;  . PERIPHERAL VASCULAR CATHETERIZATION  11/06/2015   Procedure: Peripheral Vascular Atherectomy;  Surgeon: Serafina Mitchell, MD;  Location: [place] CV LAB;  Service: Cardiovascular;;  Left Superficial femoral  . SHUNTOGRAM Left 05/15/2014   Procedure: SHUNTOGRAM;  Surgeon: Conrad Oglesby, MD;  Location: [facility] CATH LAB;  Service: Cardiovascular;  Laterality: Left;  . SMALL INTESTINE SURGERY  90's  . SPINAL GROWTH RODS  2010   "put 2 metal rods in my back; they had detetriorated" (08/30/2012)  . THROMBECTOMY    . THROMBECTOMY AND REVISION OF ARTERIOVENTOUS (AV) GORETEX  GRAFT Left 03/30/2014  . THROMBECTOMY AND REVISION OF ARTERIOVENTOUS (AV) GORETEX  GRAFT Left 03/30/2014    Procedure: THROMBECTOMY AND REVISION OF ARTERIOVENTOUS (AV) GORETEX  GRAFT;  Surgeon: Conrad Belleview, MD;  Location: [facility];  Service: Vascular;  Laterality: Left;  . TUBAL LIGATION  1990's    OB History    No data available       Home Medications    Prior to Admission medications   Medication Sig Start Date End Date Taking? Authorizing Provider  aspirin EC 81 MG tablet Take 81 mg by mouth daily at 12 noon.     [provider]  calcitRIOL (ROCALTROL) 0.5 MCG capsule Take 1 capsule (0.5 mcg total) by mouth every Monday, Wednesday, and Friday with hemodialysis. 07/25/16   Angiulli, Lavon Paganini, PA-C  calcium acetate (PHOSLO) 667 MG capsule Take 1 capsule (667 mg total) by mouth 3 (three) times daily with meals. 07/24/16   Angiulli, Lavon Paganini, PA-C  carvedilol (COREG) 3.125 MG tablet Take 1 tablet (3.125 mg total) by mouth 2 (two) times daily with a meal. 08/13/16   Rai, Ripudeep K, MD  feeding supplement (BOOST / RESOURCE BREEZE) LIQD Take 1 Container by mouth 3 (three) times daily between meals. 04/25/16   Reyne Dumas, MD  levothyroxine (SYNTHROID, LEVOTHROID) 175 MCG tablet Take 1 tablet (175 mcg total) by mouth daily at 12 noon. 07/24/16   Angiulli, Lavon Paganini, PA-C  methocarbamol (ROBAXIN) 500 MG tablet Take 0.5 tablets (250 mg total) by mouth every 6 (six) hours as needed for muscle spasms. 07/24/16   Angiulli, Lavon Paganini, PA-C  metoCLOPramide (REGLAN) 5 MG tablet Take 1 tablet (5 mg total) by mouth 2 (two) times daily before a meal. Decrease to once a day before breakfast after a couple of weeks. 07/24/16   Angiulli, Lavon Paganini, PA-C  multivitamin (RENA-VIT) TABS tablet Take 1 tablet by mouth at bedtime. 05/20/16   Love, Ivan Anchors, PA-C  nitroGLYCERIN (NITROSTAT) 0.4 MG SL tablet Place 1 tablet (0.4 mg total) under the tongue every 5 (five) minutes as needed for chest pain. 03/20/14   Elgergawy, Silver Huguenin, MD  ondansetron (ZOFRAN-ODT) 4 MG disintegrating tablet Take 1 tablet (4 mg total) by mouth every  8 (eight) hours as needed for nausea  or vomiting. 08/13/16   Rai, Ripudeep K, MD  tacrolimus (PROGRAF) 1 MG capsule Take 2 mg by mouth 2 (two) times daily.    [provider]  warfarin (COUMADIN) 2.5 MG tablet Take 1.25-2.5 mg by mouth. Take 1.25 mg (1/2 tablet) daily except 2.5 mg (1 tablet) on Tues, Thurs, Sun    [provider]    Family History Family History  Problem Relation Age of Onset  . Cancer Mother   . Diabetes Mother   . Hypertension Mother   . Stroke Mother   . Cancer Father   . Anesthesia problems Neg Hx   . Hypotension Neg Hx   . Malignant hyperthermia Neg Hx   . Pseudochol deficiency Neg Hx     Social History Social History  Substance Use Topics  . Smoking status: Current Every Day Smoker    Packs/day: 0.33    Years: 43.00    Types: Cigarettes  . Smokeless tobacco: Never Used  . Alcohol use No     Allergies   Acetaminophen; Codeine; and Mirtazapine   Review of Systems Review of Systems  Constitutional: Negative for fever.  HENT: Negative for ear pain.   Respiratory: Positive for shortness of breath.   All other systems reviewed and are negative.    Physical Exam Updated Vital Signs BP (!) 153/63 (BP Location: Right Arm)   Pulse 87   Temp 97.6 F (36.4 C) (Oral)   Resp 18   Ht 5\' 4"  (1.626 m) Comment: prior to BKA   Wt 45.5 kg (100 lb 3.2 oz)   SpO2 94%   BMI 17.20 kg/m   Physical Exam  Constitutional: She is oriented to person, place, and time. She appears well-developed and well-nourished.  HENT:  Head: Normocephalic and atraumatic.  Eyes: Conjunctivae and EOM are normal.  Neck: Normal range of motion.  Cardiovascular: Normal rate and regular rhythm.   Pulmonary/Chest: Effort normal and breath sounds normal. No stridor. No respiratory distress.  Abdominal: Soft. She exhibits no distension.  Musculoskeletal: Normal range of motion. She exhibits no edema or deformity.  Neurological: She is alert and oriented to person,  place, and time. No cranial nerve deficit.  Skin: Skin is warm and dry.  Nursing note and vitals reviewed.    ED Treatments / Results  Labs (all labs ordered are listed, but only abnormal results are displayed) Labs Reviewed  CBC WITH DIFFERENTIAL/PLATELET - Abnormal; Notable for the following:       Result Value   RDW 16.8 (*)    All other components within normal limits  COMPREHENSIVE METABOLIC PANEL - Abnormal; Notable for the following:    Sodium 131 (*)    Potassium 6.1 (*)    Chloride 92 (*)    Glucose, Bld 165 (*)    BUN 31 (*)    Creatinine, Ser 6.22 (*)    Total Protein 9.8 (*)    Albumin 2.8 (*)    AST 44 (*)    Alkaline Phosphatase 314 (*)    Total Bilirubin 2.2 (*)    GFR calc non Af Amer 7 (*)    GFR calc Af Amer 8 (*)    All other components within normal limits  TROPONIN I - Abnormal; Notable for the following:    Troponin I 0.16 (*)    All other components within normal limits  BRAIN NATRIURETIC PEPTIDE - Abnormal; Notable for the following:    B Natriuretic Peptide >4,500.0 (*)    All other components  within normal limits  PROTIME-INR - Abnormal; Notable for the following:    Prothrombin Time 20.5 (*)    All other components within normal limits  CULTURE, BLOOD (ROUTINE X 2)  CULTURE, BLOOD (ROUTINE X 2)  PROTIME-INR  CBC  BASIC METABOLIC PANEL    EKG  EKG Interpretation  Date/Time:  Wednesday August 27 2016 07:21:39 EDT Ventricular Rate:  78 PR Interval:    QRS Duration: 112 QT Interval:  434 QTC Calculation: 495 R Axis:   -37 Text Interpretation:  Sinus rhythm Borderline IVCD with LAD Probable anteroseptal infarct, recent Lateral leads are also involved No significant change since last tracing Confirmed by Merrily Pew [phone]) on 08/27/2016 11:57:44 AM       Radiology Dg Chest 2 View  Result Date: 08/27/2016 CLINICAL DATA:  Dyspnea EXAM: CHEST  2 VIEW COMPARISON:  08/09/2016 FINDINGS: Pulmonary edema and pleural effusions that are small  volume. Right pleural effusion is likely partially loculated laterally, also seen previously. Status post aortic valve replacement. Dialysis catheter on the right with tip at the right atrium. No pneumothorax. EKG leads create artifact over the chest. IMPRESSION: Pulmonary edema and small pleural effusions. Electronically Signed   By: Monte Fantasia M.D.   On: 08/27/2016 08:16    Procedures Procedures (including critical care time)  Medications Ordered in ED Medications  pentafluoroprop-tetrafluoroeth (GEBAUERS) aerosol 1 application (not administered)  lidocaine (PF) (XYLOCAINE) 1 % injection 5 mL (not administered)  lidocaine-prilocaine (EMLA) cream 1 application (not administered)  0.9 %  sodium chloride infusion (not administered)  0.9 %  sodium chloride infusion (not administered)  heparin injection 1,000 Units (not administered)  alteplase (CATHFLO ACTIVASE) injection 2 mg (not administered)  calcitRIOL (ROCALTROL) capsule 0.5 mcg (0.5 mcg Oral Given 08/27/16 1050)  ipratropium-albuterol (DUONEB) 0.5-2.5 (3) MG/3ML nebulizer solution 3 mL (3 mLs Nebulization Given 08/27/16 1643)  feeding supplement (PRO-STAT SUGAR FREE 64) liquid 30 mL (30 mLs Oral Not Given 08/27/16 1000)  sodium chloride flush (NS) 0.9 % injection 3 mL (3 mLs Intravenous Given 08/27/16 1245)  sodium chloride flush (NS) 0.9 % injection 3 mL (0 mLs Intravenous Duplicate 2/97/98 9211)  sodium chloride flush (NS) 0.9 % injection 3 mL (not administered)  0.9 %  sodium chloride infusion (not administered)  oxyCODONE (Oxy IR/ROXICODONE) immediate release tablet 5 mg (5 mg Oral Given 08/27/16 1610)  senna-docusate (Senokot-S) tablet 1 tablet (not administered)  aspirin EC tablet 81 mg (81 mg Oral Given 08/27/16 1610)  calcitRIOL (ROCALTROL) capsule 0.5 mcg (not administered)  calcium acetate (PHOSLO) capsule 667 mg (667 mg Oral Given 08/27/16 1610)  carvedilol (COREG) tablet 3.125 mg (3.125 mg Oral Given 08/27/16 1610)  feeding  supplement (BOOST / RESOURCE BREEZE) liquid 1 Container (1 Container Oral Given 08/27/16 1618)  levothyroxine (SYNTHROID, LEVOTHROID) tablet 175 mcg (not administered)  methocarbamol (ROBAXIN) tablet 250 mg (not administered)  metoCLOPramide (REGLAN) tablet 5 mg (5 mg Oral Given 08/27/16 1610)  multivitamin (RENA-VIT) tablet 1 tablet (not administered)  nitroGLYCERIN (NITROSTAT) SL tablet 0.4 mg (not administered)  ondansetron (ZOFRAN-ODT) disintegrating tablet 4 mg (not administered)  tacrolimus (PROGRAF) capsule 2 mg (not administered)  warfarin (COUMADIN) tablet 2.5 mg (2.5 mg Oral Given 08/27/16 1618)  warfarin (COUMADIN) tablet 1.25 mg (not administered)  Warfarin - Physician Dosing Inpatient ( Does not apply Given 08/27/16 1800)  fentaNYL (SUBLIMAZE) injection 50 mcg (50 mcg Intravenous Given 08/27/16 0745)  LORazepam (ATIVAN) injection 0.5 mg (0.5 mg Intravenous Given 08/27/16 0745)  vancomycin (VANCOCIN) IVPB 1000 mg/200  mL premix (1,000 mg Intravenous New Bag/Given 08/27/16 1051)  piperacillin-tazobactam (ZOSYN) IVPB 3.375 g (3.375 g Intravenous New Bag/Given 08/27/16 1049)     Initial Impression / Assessment and Plan / ED Course  I have reviewed the triage vital signs and the nursing notes.  Pertinent labs & imaging results that were available during my care of the patient were reviewed by me and considered in my medical decision making (see chart for details).     Fluid overload, needs dialysis, will admit for same.   Final Clinical Impressions(s) / ED Diagnoses   Final diagnoses:  Acute pulmonary edema ([place])      Maurion Walkowiak, Corene Cornea, MD 08/27/16 [phone]

## 2016-08-27 NOTE — Consult Note (Signed)
Reason for Consult: To manage dialysis and dialysis related needs Referring Physician: Dr. Iona Hansen is an 61 y.o. female.  HPI: Pt is a 28F with a PMH significant for HTN, HLD, DM II, PAD s/p bilateral BKAs, liver transplant 2011, and aortic valve replacement who is now seen in consultation at the request of Dr. Dayna Barker for management of ESRD and provision of HD.    Pt was sent from the [facility] ED for n/v and SOB.  Pt has just received Fentanyl and Ativan and can't really provide a lot of history.  According to OP records, she hasn't been getting to EDW.  EKG doesn't show acute changes and CXR with some vol excess vs pneumonia.  Troponin is pending.  Dialyzes at [place] 3 hr 30 min  EDW 44 kg HD MWF Bath 3K 2.25 Ca, Dialyzer  F180 Heparin none. Access R IJ TDC Calcitriol 0.5 mcg q treatment mircera 30 mcg q 4 weeks, to start 7/18.  Past Medical History:  Diagnosis Date  . Anemia    takes Folic Acid daily  . Anxiety   . Aortic stenosis   . Arthritis    "left hand, back" (08/30/2012)  . Asthma   . CAD (coronary artery disease)   . CAD (coronary artery disease) Jan. 2015   Cath: 20% LAD, 50% D1; s/p LIMA-LAD  . Chest pain 03/06/2015  . CHF (congestive heart failure) ([place]) 05/2016  . Chronic back pain   . Chronic bronchitis ([place])    "q yr w/season changes" (08/30/2012)  . Chronic constipation    takes MIralax and Colace daily  . COPD (chronic obstructive pulmonary disease) ([place])   . Depression    takes Cymbalta for "severe" depression  . End stage renal disease on dialysis [facility]) 02/27/2011   Kidneys shut down at time of liver transplant in Sept 2011 at [facility] in [place], she has been on HD ever since.  Dialyzes at [facility] HD on TTS schedule.  Had L forearm graft used 10 months then removed Dec 2012 due to suspected infection.  A right upper arm AVG was placed Dec 2012 but she developed steal symptoms acutely and it was ligated the same day.  Never had an AV fistula  due to small veins.  Now has L thigh AVG put in Jan 2013, has not clotted to date.    [name] ESRD on dialysis [facility])    "MWF; [facility]" (08/12/2016)  . GERD (gastroesophageal reflux disease)    takes Omeprazole daily  . Headache    "at least monthly" (08/30/2012)  . Hepatitis C   . History of blood transfusion    "several" (08/30/2012)  . Hypertension    takes Metoprolol and Lisinopril daily, sees Dr Bea Graff  . Hypothyroidism    takes Synthroid daily  . Migraine    "last migraine was in 2013" (08/30/2012)  . Neuromuscular disorder ([place])    carpal tunnel in right hand  . Obesity   . Peripheral vascular disease (HCC) hands and legs  . Pneumonia    "today and several times before" (08/30/2012)  . 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 S/P unilateral BKA (below knee amputation), left ([place]) 04/30/2016  . SVT (supraventricular tachycardia) ([place]) 06/09/14  . Tobacco abuse   . Type II diabetes mellitus ([place])  Levemir 2units daily if > 150    Past Surgical History:  Procedure Laterality Date  . ABDOMINAL AORTOGRAM W/LOWER EXTREMITY N/A 07/04/2016   Procedure: Abdominal Aortogram w/Lower Extremity;  Surgeon: Elam Dutch, MD;  Location: [place] CV LAB;  Service: Cardiovascular;  Laterality: N/A;  . AMPUTATION Left 04/24/2016   Procedure: AMPUTATION BELOW KNEE;  Surgeon: Angelia Mould, MD;  Location: [place];  Service: Vascular;  Laterality: Left;  . AMPUTATION Right 07/08/2016   Procedure: AMPUTATION BELOW KNEE;  Surgeon: Angelia Mould, MD;  Location: [place];  Service: Vascular;  Laterality: Right;  . AORTIC VALVE REPLACEMENT N/A 03/15/2013   AVR; Surgeon: Ivin Poot, MD;  Location: [facility] OR; Open Heart Surgery;  15mm[employer] mechanical prosthesis, top hat valve  . ARTERIOVENOUS GRAFT PLACEMENT Left 10/03/10    forearm  . AV FISTULA  PLACEMENT  01/29/2011   Procedure: INSERTION OF ARTERIOVENOUS (AV) GORE-TEX GRAFT ARM;  Surgeon: Elam Dutch, MD;  Location: [facility] OR;  Service: Vascular;  Laterality: Right;  . AV FISTULA PLACEMENT  03/10/2011   Procedure: INSERTION OF ARTERIOVENOUS (AV) GORE-TEX GRAFT THIGH;  Surgeon: Elam Dutch, MD;  Location: [place];  Service: Vascular;  Laterality: Left;  . AV FISTULA PLACEMENT Left 07/21/2016   Procedure: CREATION OF LEFT UPPER ARM ARTERIOVENOUS (AV) FISTULA;  Surgeon: Conrad , MD;  Location: [place];  Service: Vascular;  Laterality: Left;  . [place] REMOVAL  12/23/2010   Procedure: REMOVAL OF ARTERIOVENOUS GORETEX GRAFT ([place]);  Surgeon: Elam Dutch, MD;  Location: [place];  Service: Vascular;  Laterality: Left;  procedure started @1736 -5631  . CHOLECYSTECTOMY  1993  . CORONARY ANGIOGRAPHY N/A 08/12/2016   Procedure: CORONARY ANGIOGRAPHY (CATH LAB);  Surgeon: Leonie Man, MD;  Location: [place] CV LAB;  Service: Cardiovascular;  Laterality: N/A;  . CORONARY ARTERY BYPASS GRAFT N/A 03/15/2013   Procedure: CORONARY ARTERY BYPASS GRAFTING (CABG) times one using left internal mammary artery.;  Surgeon: Ivin Poot, MD;  Location: [place];  Service: Open Heart Surgery;  Laterality: N/A;  POSS CABG X 1  . CYSTOSCOPY  1990's  . INSERTION OF DIALYSIS CATHETER  12/23/2010   Procedure: INSERTION OF DIALYSIS CATHETER;  Surgeon: Elam Dutch, MD;  Location: [place];  Service: Vascular;  Laterality: Right;  Right Internal Jugular 28cm dialysis catheter insertion procedure time 1701-1720   . INSERTION OF DIALYSIS CATHETER Right 03/11/2016   Procedure: INSERTION OF DIALYSIS CATHETER;  Surgeon: Angelia Mould, MD;  Location: [place];  Service: Vascular;  Laterality: Right;  . INTRAOPERATIVE TRANSESOPHAGEAL ECHOCARDIOGRAM N/A 03/15/2013   Procedure: INTRAOPERATIVE TRANSESOPHAGEAL ECHOCARDIOGRAM;  Surgeon: Ivin Poot, MD;  Location: [place];  Service: Open Heart Surgery;  Laterality:  N/A;  . LEFT HEART CATHETERIZATION WITH CORONARY ANGIOGRAM N/A 07/29/2012   Procedure: LEFT HEART CATHETERIZATION WITH CORONARY ANGIOGRAM;  Surgeon: Troy Sine, MD;  Location: [facility] CATH LAB;  Service: Cardiovascular;  Laterality: N/A;  . LEFT HEART CATHETERIZATION WITH CORONARY ANGIOGRAM N/A 03/10/2013   Procedure: LEFT HEART CATHETERIZATION WITH CORONARY ANGIOGRAM;  Surgeon: Troy Sine, MD;  Location: [facility] CATH LAB;  Service: Cardiovascular;  Laterality: N/A;  . LEFT HEART CATHETERIZATION WITH CORONARY/GRAFT ANGIOGRAM N/A 12/24/2011   Procedure: LEFT HEART CATHETERIZATION WITH Beatrix Fetters;  Surgeon: [name], MD;  Location: [facility] CATH LAB;  Service: Cardiovascular;  Laterality: N/A;  . LEFT HEART CATHETERIZATION WITH CORONARY/GRAFT ANGIOGRAM N/A 12/16/2013   Procedure: LEFT HEART CATHETERIZATION WITH CORONARY/GRAFT ANGIOGRAM;  Surgeon: Troy Sine, MD;  Location: [facility] CATH LAB;  Service: Cardiovascular;  Laterality: N/A;  . LIGATION ARTERIOVENOUS GORTEX GRAFT Left 03/11/2016   Procedure: LIGATION THIGH ARTERIOVENOUS GORTEX GRAFT;  Surgeon: Angelia Mould, MD;  Location: [place];  Service: Vascular;  Laterality: Left;  . LIGATION OF ARTERIOVENOUS  FISTULA Left 08/11/2016   Procedure: LIGATION OF ARTERIOVENOUS  FISTULA;  Surgeon: Elam Dutch, MD;  Location: [place];  Service: Vascular;  Laterality: Left;  . LIVER TRANSPLANT  10/25/2009   sees Dr Ferol Luz 1 every 6 months, saw last in Dec 2013. [name] Coord [phone]  . PERIPHERAL VASCULAR CATHETERIZATION N/A 11/06/2015   Procedure: Abdominal Aortogram;  Surgeon: Serafina Mitchell, MD;  Location: [place] CV LAB;  Service: Cardiovascular;  Laterality: N/A;  . PERIPHERAL VASCULAR CATHETERIZATION N/A 11/06/2015   Procedure: Lower Extremity Angiography;  Surgeon: Serafina Mitchell, MD;  Location: [place] CV LAB;  Service: Cardiovascular;  Laterality: N/A;  . PERIPHERAL VASCULAR CATHETERIZATION  11/06/2015    Procedure: Peripheral Vascular Atherectomy;  Surgeon: Serafina Mitchell, MD;  Location: [place] CV LAB;  Service: Cardiovascular;;  Left Superficial femoral  . SHUNTOGRAM Left 05/15/2014   Procedure: SHUNTOGRAM;  Surgeon: Conrad Englevale, MD;  Location: [facility] CATH LAB;  Service: Cardiovascular;  Laterality: Left;  . SMALL INTESTINE SURGERY  90's  . SPINAL GROWTH RODS  2010   "put 2 metal rods in my back; they had detetriorated" (08/30/2012)  . THROMBECTOMY    . THROMBECTOMY AND REVISION OF ARTERIOVENTOUS (AV) GORETEX  GRAFT Left 03/30/2014  . THROMBECTOMY AND REVISION OF ARTERIOVENTOUS (AV) GORETEX  GRAFT Left 03/30/2014   Procedure: THROMBECTOMY AND REVISION OF ARTERIOVENTOUS (AV) GORETEX  GRAFT;  Surgeon: Conrad McIntosh, MD;  Location: [place];  Service: Vascular;  Laterality: Left;  . TUBAL LIGATION  1990's    Family History  Problem Relation Age of Onset  . Cancer Mother   . Diabetes Mother   . Hypertension Mother   . Stroke Mother   . Cancer Father   . Anesthesia problems Neg Hx   . Hypotension Neg Hx   . Malignant hyperthermia Neg Hx   . Pseudochol deficiency Neg Hx     Social History:  reports that she has been smoking Cigarettes.  She has a 14.19 pack-year smoking history. She has never used smokeless tobacco. She reports that she does not drink alcohol or use drugs.  Allergies:  Allergies  Allergen Reactions  . Acetaminophen Other (See Comments)    Liver transplant recipient   . Codeine Itching  . Mirtazapine Other (See Comments)    hallucination    Medications: I have reviewed the patient's current medications.   Results for orders placed or performed during the hospital encounter of 08/27/16 (from the past 48 hour(s))  CBC with Differential     Status: Abnormal   Collection Time: 08/27/16  7:38 AM  Result Value Ref Range   WBC 6.5 4.0 - 10.5 K/uL   RBC 4.42 3.87 - 5.11 MIL/uL   Hemoglobin 12.9 12.0 - 15.0 g/dL   HCT 38.5 36.0 - 46.0 %   MCV 87.1 78.0 - 100.0 fL   MCH  29.2 26.0 - 34.0 pg   MCHC 33.5 30.0 - 36.0 g/dL   RDW 16.8 (H) 11.5 - 15.5 %   Platelets 193 150 - 400 K/uL   Neutrophils Relative % 49 %   Neutro Abs 3.2 1.7 - 7.7 K/uL   Lymphocytes Relative 40 %  Lymphs Abs 2.6 0.7 - 4.0 K/uL   Monocytes Relative 7 %   Monocytes Absolute 0.5 0.1 - 1.0 K/uL   Eosinophils Relative 3 %   Eosinophils Absolute 0.2 0.0 - 0.7 K/uL   Basophils Relative 1 %   Basophils Absolute 0.0 0.0 - 0.1 K/uL  Protime-INR     Status: Abnormal   Collection Time: 08/27/16  7:38 AM  Result Value Ref Range   Prothrombin Time 20.5 (H) 11.4 - 15.2 seconds   INR 1.74     Dg Chest 2 View  Result Date: 08/27/2016 CLINICAL DATA:  Dyspnea EXAM: CHEST  2 VIEW COMPARISON:  08/09/2016 FINDINGS: Pulmonary edema and pleural effusions that are small volume. Right pleural effusion is likely partially loculated laterally, also seen previously. Status post aortic valve replacement. Dialysis catheter on the right with tip at the right atrium. No pneumothorax. EKG leads create artifact over the chest. IMPRESSION: Pulmonary edema and small pleural effusions. Electronically Signed   By: Monte Fantasia M.D.   On: 08/27/2016 08:16    ROS: unobtainable due to mental status Blood pressure (!) 171/90, temperature 97.6 F (36.4 C), temperature source Oral, resp. rate (!) 32. [name]  GEN somnolent, responsive to sternal rub and appropriate but slow to arouse HEENT opens eyes, sclerae anicteric NECK no JVD PULM coarse rhonchi bilaterally CV RRR loud mechanical click  ABD soft, thin, nontender EXT s/p bilateral BKAs NEURO arousable to sternal rub SKIN no rashes ACCESS: R IJ TDC  Assessment/Plan: 1 SOB: possible PNA vs fluid overload: dialysis, drew blood cultures, giving vanc/ Zosyn in HD.  Trop 0.16. Trend. Rest per primary. 2.  AMS- expect to improve with HD and dialyzing off meds, monitor.  If no improvement then would recommend head CT 3 ESRD: MWF HD, has not been getting to EDW, may need  serial treatments.  3K bath, no heparin 4 Hypertension: Hypertensive, expect to improve with UF 5. Anemia of ESRD: mircera 30 mcg q 4 weeks to start today, Hgb 12.9, hold and follow 6. Metabolic Bone Disease: continue binders and VDRA 7.  Nutrition: appetite has been a problem recently, albumin 2.8, prostat  Wilmont Olund 08/27/2016, 8:53 AM

## 2016-08-27 NOTE — Procedures (Signed)
Patient seen and examined on Hemodialysis. QB 400 R IJ TDC, UF goal 1.5L  MS and breathing improving already with dialysis.  Treatment adjusted as needed.  Madelon Lips MD [facility] pgr [phone] 9:31 AM

## 2016-08-27 NOTE — ED Triage Notes (Signed)
Per EMS: pt from dialysis c/o SOB and N/V; pt sts cough and pain with cough; pt with bilateral amputations

## 2016-08-28 DIAGNOSIS — E039 Hypothyroidism, unspecified: Secondary | ICD-10-CM | POA: Diagnosis present

## 2016-08-28 DIAGNOSIS — G8929 Other chronic pain: Secondary | ICD-10-CM | POA: Diagnosis present

## 2016-08-28 DIAGNOSIS — E1122 Type 2 diabetes mellitus with diabetic chronic kidney disease: Secondary | ICD-10-CM | POA: Diagnosis present

## 2016-08-28 DIAGNOSIS — I132 Hypertensive heart and chronic kidney disease with heart failure and with stage 5 chronic kidney disease, or end stage renal disease: Secondary | ICD-10-CM | POA: Diagnosis present

## 2016-08-28 DIAGNOSIS — Z7901 Long term (current) use of anticoagulants: Secondary | ICD-10-CM | POA: Diagnosis not present

## 2016-08-28 DIAGNOSIS — I5033 Acute on chronic diastolic (congestive) heart failure: Secondary | ICD-10-CM | POA: Diagnosis present

## 2016-08-28 DIAGNOSIS — Z952 Presence of prosthetic heart valve: Secondary | ICD-10-CM | POA: Diagnosis not present

## 2016-08-28 DIAGNOSIS — N186 End stage renal disease: Secondary | ICD-10-CM | POA: Diagnosis present

## 2016-08-28 DIAGNOSIS — Z89512 Acquired absence of left leg below knee: Secondary | ICD-10-CM | POA: Diagnosis not present

## 2016-08-28 DIAGNOSIS — Z944 Liver transplant status: Secondary | ICD-10-CM | POA: Diagnosis not present

## 2016-08-28 DIAGNOSIS — M549 Dorsalgia, unspecified: Secondary | ICD-10-CM | POA: Diagnosis present

## 2016-08-28 DIAGNOSIS — Z681 Body mass index (BMI) 19 or less, adult: Secondary | ICD-10-CM | POA: Diagnosis not present

## 2016-08-28 DIAGNOSIS — Z992 Dependence on renal dialysis: Secondary | ICD-10-CM | POA: Diagnosis not present

## 2016-08-28 DIAGNOSIS — E8889 Other specified metabolic disorders: Secondary | ICD-10-CM | POA: Diagnosis present

## 2016-08-28 DIAGNOSIS — I2510 Atherosclerotic heart disease of native coronary artery without angina pectoris: Secondary | ICD-10-CM | POA: Diagnosis present

## 2016-08-28 DIAGNOSIS — E669 Obesity, unspecified: Secondary | ICD-10-CM | POA: Diagnosis present

## 2016-08-28 DIAGNOSIS — R0902 Hypoxemia: Secondary | ICD-10-CM | POA: Diagnosis present

## 2016-08-28 DIAGNOSIS — N2581 Secondary hyperparathyroidism of renal origin: Secondary | ICD-10-CM | POA: Diagnosis not present

## 2016-08-28 DIAGNOSIS — K219 Gastro-esophageal reflux disease without esophagitis: Secondary | ICD-10-CM | POA: Diagnosis present

## 2016-08-28 DIAGNOSIS — R791 Abnormal coagulation profile: Secondary | ICD-10-CM | POA: Diagnosis present

## 2016-08-28 DIAGNOSIS — J810 Acute pulmonary edema: Secondary | ICD-10-CM

## 2016-08-28 DIAGNOSIS — Z89511 Acquired absence of right leg below knee: Secondary | ICD-10-CM | POA: Diagnosis not present

## 2016-08-28 DIAGNOSIS — E1140 Type 2 diabetes mellitus with diabetic neuropathy, unspecified: Secondary | ICD-10-CM | POA: Diagnosis present

## 2016-08-28 DIAGNOSIS — I120 Hypertensive chronic kidney disease with stage 5 chronic kidney disease or end stage renal disease: Secondary | ICD-10-CM | POA: Diagnosis not present

## 2016-08-28 DIAGNOSIS — M1990 Unspecified osteoarthritis, unspecified site: Secondary | ICD-10-CM | POA: Diagnosis present

## 2016-08-28 DIAGNOSIS — D631 Anemia in chronic kidney disease: Secondary | ICD-10-CM | POA: Diagnosis present

## 2016-08-28 DIAGNOSIS — J449 Chronic obstructive pulmonary disease, unspecified: Secondary | ICD-10-CM | POA: Diagnosis present

## 2016-08-28 LAB — GLUCOSE, CAPILLARY
GLUCOSE-CAPILLARY: 210 mg/dL — AB (ref 65–99)
GLUCOSE-CAPILLARY: 94 mg/dL (ref 65–99)
Glucose-Capillary: 94 mg/dL (ref 65–99)

## 2016-08-28 LAB — CBC
HEMATOCRIT: 33.3 % — AB (ref 36.0–46.0)
HEMOGLOBIN: 10.8 g/dL — AB (ref 12.0–15.0)
MCH: 28.6 pg (ref 26.0–34.0)
MCHC: 32.4 g/dL (ref 30.0–36.0)
MCV: 88.1 fL (ref 78.0–100.0)
Platelets: 156 10*3/uL (ref 150–400)
RBC: 3.78 MIL/uL — AB (ref 3.87–5.11)
RDW: 16.7 % — AB (ref 11.5–15.5)
WBC: 6.5 10*3/uL (ref 4.0–10.5)

## 2016-08-28 LAB — BASIC METABOLIC PANEL
ANION GAP: 8 (ref 5–15)
BUN: 16 mg/dL (ref 6–20)
CALCIUM: 8.8 mg/dL — AB (ref 8.9–10.3)
CHLORIDE: 97 mmol/L — AB (ref 101–111)
CO2: 28 mmol/L (ref 22–32)
Creatinine, Ser: 3.62 mg/dL — ABNORMAL HIGH (ref 0.44–1.00)
GFR calc non Af Amer: 13 mL/min — ABNORMAL LOW (ref >60)
GFR, EST AFRICAN AMERICAN: 15 mL/min — AB (ref >60)
Glucose, Bld: 191 mg/dL — ABNORMAL HIGH (ref 65–99)
POTASSIUM: 4.1 mmol/L (ref 3.5–5.1)
Sodium: 133 mmol/L — ABNORMAL LOW (ref 135–145)

## 2016-08-28 LAB — BLOOD CULTURE ID PANEL (REFLEXED)
Acinetobacter baumannii: NOT DETECTED
CANDIDA GLABRATA: NOT DETECTED
CANDIDA KRUSEI: NOT DETECTED
CANDIDA PARAPSILOSIS: NOT DETECTED
Candida albicans: NOT DETECTED
Candida tropicalis: NOT DETECTED
ENTEROBACTER CLOACAE COMPLEX: NOT DETECTED
ESCHERICHIA COLI: NOT DETECTED
Enterobacteriaceae species: NOT DETECTED
Enterococcus species: NOT DETECTED
Haemophilus influenzae: NOT DETECTED
KLEBSIELLA OXYTOCA: NOT DETECTED
KLEBSIELLA PNEUMONIAE: NOT DETECTED
Listeria monocytogenes: NOT DETECTED
Neisseria meningitidis: NOT DETECTED
PROTEUS SPECIES: NOT DETECTED
PSEUDOMONAS AERUGINOSA: NOT DETECTED
SERRATIA MARCESCENS: NOT DETECTED
STAPHYLOCOCCUS AUREUS BCID: NOT DETECTED
STAPHYLOCOCCUS SPECIES: NOT DETECTED
STREPTOCOCCUS PNEUMONIAE: NOT DETECTED
Streptococcus agalactiae: NOT DETECTED
Streptococcus pyogenes: NOT DETECTED
Streptococcus species: NOT DETECTED

## 2016-08-28 LAB — PROTIME-INR
INR: 1.97
Prothrombin Time: 22.7 seconds — ABNORMAL HIGH (ref 11.4–15.2)

## 2016-08-28 MED ORDER — HEPARIN (PORCINE) IN NACL 100-0.45 UNIT/ML-% IJ SOLN
900.0000 [IU]/h | INTRAMUSCULAR | Status: DC
  Administered 2016-08-28: 800 [IU]/h via INTRAVENOUS
  Filled 2016-08-28: qty 250

## 2016-08-28 MED ORDER — OXYCODONE HCL 5 MG PO TABS
ORAL_TABLET | ORAL | Status: AC
  Administered 2016-08-28: 5 mg via ORAL
  Filled 2016-08-28: qty 1

## 2016-08-28 MED ORDER — PRO-STAT SUGAR FREE PO LIQD: 30.0000 mL | Freq: Two times a day (BID) | ORAL | 0 refills | Status: AC

## 2016-08-28 MED ORDER — WARFARIN SODIUM 2.5 MG PO TABS
2.5000 mg | ORAL_TABLET | Freq: Once | ORAL | Status: AC
  Administered 2016-08-28: 2.5 mg via ORAL
  Filled 2016-08-28: qty 1

## 2016-08-28 NOTE — Progress Notes (Signed)
[facility] Progress Note  Patient: [name] JSH:[id number]   PCP: System, Pcp Not In DOB: 11-10-1955   DOA: 08/27/2016   DOS: 08/28/2016   Date of Service: the patient was seen and examined on 08/28/2016  Subjective: Denies any complaint. Feeling better. No nausea no vomiting. No shortness of breath. No cough no chest pain. No abdominal pain.  Brief hospital course: Pt. with PMH of ESRD on HD, CHF, mechanical aortic valve, type II DM, CAD, bilateral BKA; admitted on 08/27/2016, presented with complaint of fatigue as well as shortness of breath, was found to have acute hypoxic restriction failure due to acute on chronic diastolic CHF. Currently further plan is continue hemodialysis.  Assessment and Plan: 1. Acute on chronic diastolic dysfunction  Acute hypoxic procedure failure. ESRD on hemodialysis. Appreciate nephrology input. Basic problem is that the patient has not been getting to her EDW. Nephrology is requesting the patient to extend the duration of HD, patient currently is agreeing to it. In the hospital the patient is receiving daily DHD now, will monitor recommendation from nephrology. Continue home regimen.  2. Mechanical aortic valve. Subtherapeutic INR. Patient will need to be bridged with heparin. Pharmacy consulted. Continue Coumadin.  3. Bacteremia. Patient has 1 out of 2 blood cultures positive for gram-positive cocci. BCID is unable to identify the bacteria. Patient does not have any evidence of sepsis or infection. Although the patient is on immunosuppressive medication and may not mount typical inflammatory response. We will currently monitor off of antibiotic, repeat culture and monitor.  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  Advance goals of care discussion: DNR DNI  Family Communication: no family was present at bedside, at the time of interview.  Disposition:  Discharge to BE DETERMINED.[name]  Consultants: nephrology Procedures: HS  Antibiotics: Anti-infectives    Start     Dose/Rate Route Frequency Ordered Stop   08/27/16 1045  piperacillin-tazobactam (ZOSYN) IVPB 3.375 g     3.375 g 100 mL/hr over 30 Minutes Intravenous  Once 08/27/16 0821 08/27/16 1119   08/27/16 0830  vancomycin (VANCOCIN) IVPB 1000 mg/200 mL premix     1,000 mg 200 mL/hr over 60 Minutes Intravenous  Once 08/27/16 0821 08/27/16 1151       Objective: Physical Exam: Vitals:   08/28/16 1630 08/28/16 1700 08/28/16 1730 08/28/16 1800  BP: 128/69 (!) 148/81 (!) 144/77 129/68  Pulse: 85 86 100 88  Resp:      Temp:      TempSrc:      SpO2:      Weight:      Height:        Intake/Output Summary (Last 24 hours) at 08/28/16 1821 Last data filed at 08/28/16 1501  Gross per 24 hour  Intake              360 ml  Output                0 ml  Net              360 ml   Filed Weights   08/27/16 1500 08/27/16 2219 08/28/16 1523  Weight: 45.5 kg (100 lb 3.2 oz) 45.4 kg (100 lb) 45.3 kg (99 lb 13.9 oz)  General: Alert, Awake and Oriented to Time, Place and Person. Appear in mild distress, affect appropriate Eyes: PERRL, Conjunctiva normal ENT: Oral Mucosa clear moist. Neck: no JVD, no Abnormal Mass Or lumps Cardiovascular: S1 and S2 Present, aortic systolic Murmur, Peripheral Pulses Present Respiratory: normal respiratory effort, Bilateral Air entry equal and Decreased, no use of accessory muscle, Clear to Auscultation, no Crackles, no wheezes Abdomen: Bowel Sound [present, Soft and no tenderness, no hernia Skin: no redness, no Rash, no induration Extremities: bilateral BKA Neurologic: Grossly no focal neuro deficit. Bilaterally Equal motor  strength  Data Reviewed: CBC:  Recent Labs Lab 08/27/16 0738 08/28/16 0526  WBC 6.5 6.5  NEUTROABS 3.2  --   HGB 12.9 10.8*  HCT 38.5 33.3*  MCV 87.1 88.1  PLT 193 882   Basic Metabolic Panel:  Recent Labs Lab 08/27/16 0738 08/28/16 0526  NA 131* 133*  K 6.1* 4.1  CL 92* 97*  CO2 27 28  GLUCOSE 165* 191*  BUN 31* 16  CREATININE 6.22* 3.62*  CALCIUM 10.0 8.8*    Liver Function Tests:  Recent Labs Lab 08/27/16 0738  AST 44*  ALT 24  ALKPHOS 314*  BILITOT 2.2*  PROT 9.8*  ALBUMIN 2.8*   No results for input(s): LIPASE, AMYLASE in the last 168 hours. No results for input(s): AMMONIA in the last 168 hours. Coagulation Profile:  Recent Labs Lab 08/27/16 0738 08/28/16 0526  INR 1.74 1.97   Cardiac Enzymes:  Recent Labs Lab 08/27/16 0738  TROPONINI 0.16*   BNP (last 3 results) No results for input(s): PROBNP in the last 8760 hours. CBG:  Recent Labs Lab 08/28/16 0754 08/28/16 1156  GLUCAP 94 210*   Studies: No results found.  Scheduled Meds: . aspirin EC  81 mg Oral Q1200  . calcitRIOL  0.5 mcg Oral Q M,W,F-HD  . [START ON 08/29/2016] calcitRIOL  0.5 mcg Oral Q M,W,F-HD  . calcium acetate  667 mg Oral TID WC  . carvedilol  3.125 mg Oral BID WC  . feeding supplement  1 Container Oral TID BM  . feeding supplement (PRO-STAT SUGAR FREE 64)  30 mL Oral BID  . insulin aspart  0-5 Units Subcutaneous QHS  . insulin aspart  0-9 Units Subcutaneous TID WC  . levothyroxine  175 mcg Oral QAC breakfast  . metoCLOPramide  5 mg Oral BID AC  . multivitamin  1 tablet Oral QHS  . sodium chloride flush  3 mL Intravenous Q12H  . sodium chloride flush  3 mL Intravenous Q12H  . tacrolimus  2 mg Oral BID  . warfarin  1.25 mg Oral Q T,Th,S,Su-1800  . warfarin  2.5 mg Oral Q M,W,F-1800  . Warfarin - Physician Dosing Inpatient   Does not apply q1800   Continuous Infusions: . sodium chloride    . sodium chloride    . sodium chloride     PRN Meds: sodium  chloride, sodium chloride, sodium chloride, alteplase, heparin, ipratropium-albuterol, lidocaine (PF), lidocaine-prilocaine, methocarbamol, nitroGLYCERIN, ondansetron, oxyCODONE, pentafluoroprop-tetrafluoroeth, senna-docusate, sodium chloride flush  Time spent: 35 minutes  Author: Berle Mull, MD [facility] Pager: [phone] 08/28/2016 6:21 PM  If 7PM-7AM, please contact night-coverage at [email or web address], password [facility]

## 2016-08-28 NOTE — Progress Notes (Signed)
PHARMACY - PHYSICIAN COMMUNICATION CRITICAL VALUE ALERT - BLOOD CULTURE IDENTIFICATION (BCID)  Results for orders placed or performed during the hospital encounter of 08/27/16  Blood Culture ID Panel (Reflexed) (Collected: 08/27/2016  8:05 AM)  Result Value Ref Range   Enterococcus species NOT DETECTED NOT DETECTED   Listeria monocytogenes NOT DETECTED NOT DETECTED   Staphylococcus species NOT DETECTED NOT DETECTED   Staphylococcus aureus NOT DETECTED NOT DETECTED   Streptococcus species NOT DETECTED NOT DETECTED   Streptococcus agalactiae NOT DETECTED NOT DETECTED   Streptococcus pneumoniae NOT DETECTED NOT DETECTED   Streptococcus pyogenes NOT DETECTED NOT DETECTED   Acinetobacter baumannii NOT DETECTED NOT DETECTED   Enterobacteriaceae species NOT DETECTED NOT DETECTED   Enterobacter cloacae complex NOT DETECTED NOT DETECTED   Escherichia coli NOT DETECTED NOT DETECTED   Klebsiella oxytoca NOT DETECTED NOT DETECTED   Klebsiella pneumoniae NOT DETECTED NOT DETECTED   Proteus species NOT DETECTED NOT DETECTED   Serratia marcescens NOT DETECTED NOT DETECTED   Haemophilus influenzae NOT DETECTED NOT DETECTED   Neisseria meningitidis NOT DETECTED NOT DETECTED   Pseudomonas aeruginosa NOT DETECTED NOT DETECTED   Candida albicans NOT DETECTED NOT DETECTED   Candida glabrata NOT DETECTED NOT DETECTED   Candida krusei NOT DETECTED NOT DETECTED   Candida parapsilosis NOT DETECTED NOT DETECTED   Candida tropicalis NOT DETECTED NOT DETECTED   Blood cultures show: GPC 1/2. BCID 'no detected'. No current antibiotics  Name of physician (or Provider) Contacted: Dr. Posey Pronto  Changes to prescribed antibiotics required: No changes. To repeat cultures  Hildred Laser, Pharm D 08/28/2016 5:01 PM

## 2016-08-28 NOTE — Procedures (Signed)
I was present at this session.  I have reviewed the session itself and made appropriate changes.  HD via RIJ PC.  bp in 120-140 range.  tol well.  Flow 375  [name] L 7/19/20186:58 PM

## 2016-08-28 NOTE — Care Management Obs Status (Signed)
[place] NOTIFICATION   Patient Details  Name: [name] MRN: [id number] Date of Birth: [birth year]   Medicare Observation Status Notification Given:  Yes    Nesanel Aguila, Rory Percy, RN 08/28/2016, 2:19 PM

## 2016-08-28 NOTE — Progress Notes (Signed)
Patient arrived to unit by bed.  Reviewed treatment plan and this RN agrees with plan.  Report received from bedside RN, Teodoro Spray.  Consent verified.  Patient A & O X 4.   Lung sounds diminished to ausculation in all fields. No edema. Cardiac:  Trigem.  Removed caps and cleansed RIJ catheter with chlorhedxidine.  Aspirated ports of heparin and flushed them with saline per protocol.  Connected and secured lines, initiated treatment at 1528.  UF Goal of 1500 mL and net fluid removal 1 L.  Will continue to monitor.

## 2016-08-28 NOTE — Care Management Note (Addendum)
Case Management Note  Patient Details  Name: [name] MRN: [id number] Date of Birth: 1955/08/05  Subjective/Objective:      CM following for progression and d/c planning.               Action/Plan: 08/28/2016 Notified by Jackquline Denmark that this pt no longer qualifies for [facility] services, they have also notified this pt. No HH or DME  Needs identified a this time.  08/29/2016 Met with pt and arranged [facility] for [facility], [place] and [facility] aide, they are unable to provide a Education officer, museum. Services will begin the first of next week, they will contact this pt to arrange [facility] visits.   Expected Discharge Date:   08/29/2016               Expected Discharge Plan:  Home/Self Care  In-House Referral:  NA  Discharge planning Services  CM Consult  Post Acute Care Choice:  NA Choice offered to:  NA  DME Arranged:  N/A DME Agency:  NA  HH Arranged:  NA HH Agency:  NA  Status of Service:  Completed, signed off  If discussed at [place] of Stay Meetings, dates discussed:    Additional Comments:  Adron Bene, RN 08/28/2016, 2:20 PM

## 2016-08-28 NOTE — Progress Notes (Signed)
ANTICOAGULATION CONSULT NOTE - Initial Consult  Pharmacy Consult for heparin  Indication: mechanical AVR   Allergies  Allergen Reactions  . Acetaminophen Other (See Comments)    Liver transplant recipient   . Codeine Itching  . Mirtazapine Other (See Comments)    hallucination    Patient Measurements: Height: 5\' 4"  (162.6 cm) (prior to BKA ) Weight: 99 lb 13.9 oz (45.3 kg) IBW/kg (Calculated) : 54.7   Vital Signs: Temp: 98.6 F (37 C) (07/19 1523) Temp Source: Oral (07/19 0838) BP: 129/68 (07/19 1800) Pulse Rate: 88 (07/19 1800)  Labs:  Recent Labs  08/27/16 0738 08/28/16 0526  HGB 12.9 10.8*  HCT 38.5 33.3*  PLT 193 156  LABPROT 20.5* 22.7*  INR 1.74 1.97  CREATININE 6.22* 3.62*  TROPONINI 0.16*  --     Estimated Creatinine Clearance: 11.7 mL/min (A) (by C-G formula based on SCr of 3.62 mg/dL (H)).   Medical History: Past Medical History:  Diagnosis Date  . Anemia    takes Folic Acid daily  . Anxiety   . Aortic stenosis   . Arthritis    "left hand, back" (08/30/2012)  . Asthma   . CAD (coronary artery disease)   . CAD (coronary artery disease) Jan. 2015   Cath: 20% LAD, 50% D1; s/p LIMA-LAD  . Chest pain 03/06/2015  . CHF (congestive heart failure) ([place]) 05/2016  . Chronic back pain   . Chronic bronchitis ([place])    "q yr w/season changes" (08/30/2012)  . Chronic constipation    takes MIralax and Colace daily  . COPD (chronic obstructive pulmonary disease) ([place])   . Depression    takes Cymbalta for "severe" depression  . End stage renal disease on dialysis [facility]) 02/27/2011   Kidneys shut down at time of liver transplant in Sept 2011 at [facility] in [place], she has been on HD ever since.  Dialyzes at [facility] HD on TTS schedule.  Had L forearm graft used 10 months then removed Dec 2012 due to suspected infection.  A right upper arm AVG was placed Dec 2012 but she developed steal symptoms acutely and it was ligated the same day.  Never had an AV fistula  due to small veins.  Now has L thigh AVG put in Jan 2013, has not clotted to date.    [name] ESRD on dialysis [facility])    "MWF; [facility]" (08/12/2016)  . GERD (gastroesophageal reflux disease)    takes Omeprazole daily  . Headache    "at least monthly" (08/30/2012)  . Hepatitis C   . History of blood transfusion    "several" (08/30/2012)  . Hypertension    takes Metoprolol and Lisinopril daily, sees Dr Bea Graff  . Hypothyroidism    takes Synthroid daily  . Migraine    "last migraine was in 2013" (08/30/2012)  . Neuromuscular disorder ([place])    carpal tunnel in right hand  . Obesity   . Peripheral vascular disease (HCC) hands and legs  . Pneumonia    "today and several times before" (08/30/2012)  . 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 S/P unilateral BKA (below knee amputation), left ([place]) 04/30/2016  . SVT (supraventricular tachycardia) ([place]) 06/09/14  . Tobacco abuse   . Type II diabetes mellitus (HCC)    Levemir 2units daily if > 150  Medications:  Prescriptions Prior to Admission  Medication Sig Dispense Refill Last Dose  . ALPRAZolam (XANAX) 0.25 MG tablet Take 0.25 mg by mouth 3 (three) times daily as needed for anxiety.   08/26/2016 at Unknown time  . aspirin EC 81 MG tablet Take 81 mg by mouth daily at 12 noon.    08/26/2016 at Unknown time  . calcitRIOL (ROCALTROL) 0.5 MCG capsule Take 1 capsule (0.5 mcg total) by mouth every Monday, Wednesday, and Friday with hemodialysis. 30 capsule 1 Past Week at Unknown time  . calcium acetate (PHOSLO) 667 MG capsule Take 1 capsule (667 mg total) by mouth 3 (three) times daily with meals. 90 capsule 1 08/26/2016 at Unknown time  . carvedilol (COREG) 3.125 MG tablet Take 1 tablet (3.125 mg total) by mouth 2 (two) times daily with a meal. 60 tablet 3 08/26/2016 at pm  . feeding supplement (BOOST / RESOURCE BREEZE)  LIQD Take 1 Container by mouth 3 (three) times daily between meals. 1 Container 100 08/26/2016 at Unknown time  . levothyroxine (SYNTHROID, LEVOTHROID) 175 MCG tablet Take 1 tablet (175 mcg total) by mouth daily at 12 noon. 30 tablet 1 08/26/2016 at Unknown time  . methocarbamol (ROBAXIN) 500 MG tablet Take 0.5 tablets (250 mg total) by mouth every 6 (six) hours as needed for muscle spasms. 60 tablet 0 08/26/2016 at Unknown time  . metoCLOPramide (REGLAN) 5 MG tablet Take 1 tablet (5 mg total) by mouth 2 (two) times daily before a meal. Decrease to once a day before breakfast after a couple of weeks. 60 tablet 0 08/26/2016 at Unknown time  . mirtazapine (REMERON) 15 MG tablet Take 15 mg by mouth daily.  2 08/26/2016 at Unknown time  . ondansetron (ZOFRAN-ODT) 4 MG disintegrating tablet Take 1 tablet (4 mg total) by mouth every 8 (eight) hours as needed for nausea or vomiting. 20 tablet 0 08/26/2016 at Unknown time  . tacrolimus (PROGRAF) 1 MG capsule Take 2 mg by mouth 2 (two) times daily.   08/26/2016 at Unknown time  . warfarin (COUMADIN) 2.5 MG tablet Take 1.25-2.5 mg by mouth See admin instructions. Take 1.25 mg (1/2 tablet) Sat Sun Tues Thur and 2.5mg  (whole tablet) Mon Wed Fri.   08/26/2016 at 1800  . multivitamin (RENA-VIT) TABS tablet Take 1 tablet by mouth at bedtime. (Patient not taking: Reported on 08/27/2016) 30 tablet 0 Not Taking at Unknown time  . nitroGLYCERIN (NITROSTAT) 0.4 MG SL tablet Place 1 tablet (0.4 mg total) under the tongue every 5 (five) minutes as needed for chest pain. (Patient not taking: Reported on 08/27/2016) 30 tablet 0 Not Taking at Unknown time    Assessment: 61 yo female here with SOB. She has a mechanical AVR and is on coumadin PTA. INR= 1.97 (goal 2.5-3.5) and pharmacy consulted for heparin and coumadin. She is noted with ESRD on HD.   Home coumadin dose 1.25mg  STTS, 2.5mg  MWF (last clinic visit 7/9; INR was 2.0)  Goal of Therapy:  INR= 2.5-3.5 Monitor platelets by  anticoagulation protocol: Yes   Plan:  -No heparin bolus due to elevated INR -start heparin at 800 units/hr  -Heparin level in 6 hours and daily wth CBC daily -Coumadin 2.5mg  po today -Daily PT/INR  Hildred Laser, Pharm D 08/28/2016 6:38 PM

## 2016-08-28 NOTE — Plan of Care (Signed)
Problem: Acute Rehab PT Goals(only PT should resolve) Goal: Pt Will Verbalize and Adhere to Precautions While PT Will Verbalize and Adhere to Precautions While Performing Mobility Avoiding WB on BLE stumps

## 2016-08-28 NOTE — Progress Notes (Signed)
Dialysis treatment completed.  1500 mL ultrafiltrated.  1000 mL net fluid removal.  Patient status unchanged. Lung sounds diminished to ausculation in all fields. No edema. Cardiac: NSR.  Cleansed RIJ catheter with chlorhexidine.  Disconnected lines and flushed ports with saline per protocol.  Ports locked with heparin and capped per protocol.    Report given to bedside, RN Teodoro Spray.

## 2016-08-28 NOTE — Progress Notes (Signed)
[facility] Progress Note  Dialysis Orders:  [name] 3 hr 30 min  EDW 44 kg HD MWF Bath 3K 2.25 Ca, Dialyzer  F180 Heparin none. Access R IJ TDC Calcitriol 0.5 mcg q treatment mircera 30 mcg q 4 weeks, to start 7/18.  Assessment/Plan: 1 SOB: possible PNA vs fluid overload: Seems more likely volume overload. Breathing significantly improved with 1L UF on HD yesterday. Plan HD again today. Blood cultures NGTD  Loading dose vanc/ Zosyn given yesteray  Trop 0.16. Trend. Rest per primary. 2.  AMS- improved following HD, Back to baseline  3 ESRD: MWF HD, has not been getting to EDW, may need serial treatments.  3K bath, no heparin 4 Hypertension: Hypertensive, improved with HD. UF to EDW today  5. Anemia of ESRD: mircera 30 mcg q 4 weeks to start today, Hgb 12.9, hold and follow 6. Metabolic Bone Disease: continue binders and VDRA 7.  Nutrition: appetite has been a problem recently, albumin 2.8, prostat 8. S/p AVR on Coumadin  9. DM Dispo - difficult situation as seems to go into pulm edema/resp distress with relatively small fluid gains. Hypotension often limits UF at outpatient center and patient not agreeable to extending treatment time    Subjective:  Feeling much better today. Slept well last night.  Hopes to go home tomorrow for birthday   Objective Vitals:   08/27/16 2057 08/27/16 2219 08/28/16 0554 08/28/16 0838  BP:  (!) 127/43 (!) 90/44 (!) 103/48  Pulse: 86 (!) 101 94 68  Resp: 18 18 18 18   Temp:  99.5 F (37.5 C) 98.5 F (36.9 C) 98.5 F (36.9 C)  TempSrc:  Oral Oral Oral  SpO2:  99% 95% 98%  Weight:  45.4 kg (100 lb)    Height:       Physical Exam General: Frail elderly AAF NAD  Heart: RRR mechanical click Lungs: Breathing unlabored CTAB  Abdomen: soft ND  Extremities: bilat BKA Dialysis Access: R IJ TDC    [name] PA-C [facility] Pager [phone] 08/28/2016,9:48 AM  LOS: 0 days   Additional  Objective Labs: Basic Metabolic Panel:  Recent Labs Lab 08/27/16 0738 08/28/16 0526  NA 131* 133*  K 6.1* 4.1  CL 92* 97*  CO2 27 28  GLUCOSE 165* 191*  BUN 31* 16  CREATININE 6.22* 3.62*  CALCIUM 10.0 8.8*   Liver Function Tests:  Recent Labs Lab 08/27/16 0738  AST 44*  ALT 24  ALKPHOS 314*  BILITOT 2.2*  PROT 9.8*  ALBUMIN 2.8*   No results for input(s): LIPASE, AMYLASE in the last 168 hours. CBC:  Recent Labs Lab 08/27/16 0738 08/28/16 0526  WBC 6.5 6.5  NEUTROABS 3.2  --   HGB 12.9 10.8*  HCT 38.5 33.3*  MCV 87.1 88.1  PLT 193 156   Blood Culture    Component Value Date/Time   SDES BLOOD HEMODIALYSIS CATHETER 08/27/2016 0820   SPECREQUEST  08/27/2016 0820    BOTTLES DRAWN AEROBIC AND ANAEROBIC Blood Culture adequate volume   CULT NO GROWTH 1 DAY 08/27/2016 0820   REPTSTATUS PENDING 08/27/2016 0820    Cardiac Enzymes:  Recent Labs Lab 08/27/16 0738  TROPONINI 0.16*   CBG:  Recent Labs Lab 08/28/16 0754  GLUCAP 94   Iron Studies: No results for input(s): IRON, TIBC, TRANSFERRIN, FERRITIN in the last 72 hours. Lab Results  Component Value Date   INR 1.97 08/28/2016   INR 1.74 08/27/2016   INR 2.0 (A) 08/18/2016  Medications: . sodium chloride    . sodium chloride    . sodium chloride     . aspirin EC  81 mg Oral Q1200  . calcitRIOL  0.5 mcg Oral Q M,W,F-HD  . [START ON 08/29/2016] calcitRIOL  0.5 mcg Oral Q M,W,F-HD  . calcium acetate  667 mg Oral TID WC  . carvedilol  3.125 mg Oral BID WC  . feeding supplement  1 Container Oral TID BM  . feeding supplement (PRO-STAT SUGAR FREE 64)  30 mL Oral BID  . insulin aspart  0-5 Units Subcutaneous QHS  . insulin aspart  0-9 Units Subcutaneous TID WC  . levothyroxine  175 mcg Oral QAC breakfast  . metoCLOPramide  5 mg Oral BID AC  . multivitamin  1 tablet Oral QHS  . sodium chloride flush  3 mL Intravenous Q12H  . sodium chloride flush  3 mL Intravenous Q12H  . tacrolimus  2 mg Oral  BID  . warfarin  1.25 mg Oral Q T,Th,S,Su-1800  . warfarin  2.5 mg Oral Q M,W,F-1800  . Warfarin - Physician Dosing Inpatient   Does not apply [phone]

## 2016-08-28 NOTE — Evaluation (Signed)
Physical Therapy Evaluation Patient Details Name: [name] MRN: [id number] DOB: Nov 16, 1955 Today's Date: 08/28/2016   History of Present Illness  61 y.o.female with volume overload, renal failure, pulm edema and recent NSTEMI. Was dialyzed incorrectly, now admitted with SOB. Has B BK amputations from 2018, not yet fitted prostheses.  PMHx:  HTN, DM, CAD, back pain, depression, ESRD wiht HD, mechanical aortic valve, liver transplant, PAD.  Clinical Impression  Pt is up to chair with PT assisting her, able to position knees fully extended and avoiding pressure on stumps due to incomplete healing of surgery sites on BK amputations.  Her plan is to get home and has appt with surgeon to see what time table for prosthetics will be.  Will recommend HHPT and family to continue to assist her as needed, and will anticipate her need for help ongoing due to the lack of prosthetic limbs until healing complete.  Follow acutely for strengthening and transfers to chair.      Follow Up Recommendations Home health PT;Supervision/Assistance - 24 hour    Equipment Recommendations  None recommended by PT    Recommendations for Other Services       Precautions / Restrictions Precautions Precautions: Fall (telemtry) Restrictions Weight Bearing Restrictions: Yes RLE Weight Bearing: Non weight bearing LLE Weight Bearing: Non weight bearing      Mobility  Bed Mobility Overal bed mobility: Modified Independent             General bed mobility comments: sits up in bed without assistance  Transfers Overall transfer level: Needs assistance Equipment used: 1 person hand held assist (to scoot into chair) Transfers: Comptroller transfers: Min assist   General transfer comment: assisted with set up and minor help to scoot back to chair and position her  Ambulation/Gait             General Gait Details: not fitted with legs yet  Stairs             Wheelchair Mobility    Modified Rankin (Stroke Patients Only)       Balance Overall balance assessment: Needs assistance Sitting-balance support: No upper extremity supported Sitting balance-Leahy Scale: Fair                                       Pertinent Vitals/Pain Pain Assessment: No/denies pain    Home Living Family/patient expects to be discharged to:: Private residence Living Arrangements: Children Available Help at Discharge: Family;Available 24 hours/day Type of Home: Mobile home Home Access: Ramped entrance     Home Layout: One level Home Equipment: Bedside commode;Walker - 2 wheels;Wheelchair - manual Additional Comments: A ramp has been built for her to enter home    Prior Function Level of Independence: Needs assistance   Gait / Transfers Assistance Needed: can transition to wc without assistance at home  ADL's / Homemaking Assistance Needed: pt can do her own bathing and dressing, family help with cooking and cleaning        Hand Dominance   Dominant Hand: Right    Extremity/Trunk Assessment   Upper Extremity Assessment Upper Extremity Assessment: Overall WFL for tasks assessed    Lower Extremity Assessment Lower Extremity Assessment: Generalized weakness    Cervical / Trunk Assessment Cervical / Trunk Assessment: Normal  Communication      Cognition Arousal/Alertness: Awake/alert Behavior During Therapy: [facility]  for tasks assessed/performed Overall Cognitive Status: Within Functional Limits for tasks assessed                                        General Comments      Exercises     Assessment/Plan    PT Assessment Patient needs continued PT services  PT Problem List Decreased strength;Decreased range of motion;Decreased activity tolerance;Decreased balance;Decreased mobility;Decreased coordination;Cardiopulmonary status limiting activity;Decreased skin integrity       PT Treatment  Interventions DME instruction;Functional mobility training;Therapeutic activities;Therapeutic exercise;Balance training;Neuromuscular re-education;Patient/family education    PT Goals (Current goals can be found in the Care Plan section)  Acute Rehab PT Goals Patient Stated Goal: to get stronger and fitted with LE's PT Goal Formulation: With patient Time For Goal Achievement: 09/11/16 Potential to Achieve Goals: Good    Frequency Min 3X/week   Barriers to discharge Other (comment) (requires wc mobility at home) works on all daily activities from [employer] level    Co-evaluation               AM-PAC PT "6 Clicks" Daily Activity  Outcome Measure Difficulty turning over in bed (including adjusting bedclothes, sheets and blankets)?: A Little Difficulty moving from lying on back to sitting on the side of the bed? : A Little Difficulty sitting down on and standing up from a chair with arms (e.g., wheelchair, bedside commode, etc,.)?: Total Help needed moving to and from a bed to chair (including a wheelchair)?: A Little Help needed walking in hospital room?: Total Help needed climbing 3-5 steps with a railing? : Total 6 Click Score: 12    End of Session   Activity Tolerance: Patient tolerated treatment well Patient left: in chair;with call bell/phone within reach;with chair alarm set Nurse Communication: Mobility status PT Visit Diagnosis: Muscle weakness (generalized) (M62.81);Difficulty in walking, not elsewhere classified (R26.2)    Time: 1021-1173 PT Time Calculation (min) (ACUTE ONLY): 23 min   Charges:   PT Evaluation $PT Eval Low Complexity: 1 Procedure PT Treatments $Therapeutic Activity: 8-22 mins   PT G Codes:   PT G-Codes **NOT FOR INPATIENT CLASS** Functional Assessment Tool Used: AM-PAC 6 Clicks Basic Mobility;Clinical judgement Functional Limitation: Changing and maintaining body position Changing and Maintaining Body Position Current Status (V6701): At least 20  percent but less than 40 percent impaired, limited or restricted Changing and Maintaining Body Position Goal Status (I1030): At least 1 percent but less than 20 percent impaired, limited or restricted    Ramond Dial 08/28/2016, 1:00 PM   Mee Hives, PT MS Acute Rehab Dept. Number: [place] and [place]

## 2016-08-29 LAB — CBC
HCT: 33.4 % — ABNORMAL LOW (ref 36.0–46.0)
HEMOGLOBIN: 10.9 g/dL — AB (ref 12.0–15.0)
MCH: 28.5 pg (ref 26.0–34.0)
MCHC: 32.6 g/dL (ref 30.0–36.0)
MCV: 87.4 fL (ref 78.0–100.0)
Platelets: 165 10*3/uL (ref 150–400)
RBC: 3.82 MIL/uL — AB (ref 3.87–5.11)
RDW: 16.6 % — ABNORMAL HIGH (ref 11.5–15.5)
WBC: 6.9 10*3/uL (ref 4.0–10.5)

## 2016-08-29 LAB — GLUCOSE, CAPILLARY
GLUCOSE-CAPILLARY: 90 mg/dL (ref 65–99)
GLUCOSE-CAPILLARY: 72 mg/dL (ref 65–99)
Glucose-Capillary: 172 mg/dL — ABNORMAL HIGH (ref 65–99)

## 2016-08-29 LAB — HEPARIN LEVEL (UNFRACTIONATED)
Heparin Unfractionated: 0.12 IU/mL — ABNORMAL LOW (ref 0.30–0.70)
Heparin Unfractionated: 0.17 IU/mL — ABNORMAL LOW (ref 0.30–0.70)

## 2016-08-29 LAB — PROTIME-INR
INR: 1.91
PROTHROMBIN TIME: 22.2 s — AB (ref 11.4–15.2)

## 2016-08-29 LAB — BASIC METABOLIC PANEL
Anion gap: 8 (ref 5–15)
BUN: 7 mg/dL (ref 6–20)
CHLORIDE: 95 mmol/L — AB (ref 101–111)
CO2: 27 mmol/L (ref 22–32)
Calcium: 8.7 mg/dL — ABNORMAL LOW (ref 8.9–10.3)
Creatinine, Ser: 2.41 mg/dL — ABNORMAL HIGH (ref 0.44–1.00)
GFR calc Af Amer: 24 mL/min — ABNORMAL LOW (ref >60)
GFR calc non Af Amer: 20 mL/min — ABNORMAL LOW (ref >60)
GLUCOSE: 130 mg/dL — AB (ref 65–99)
Potassium: 3.4 mmol/L — ABNORMAL LOW (ref 3.5–5.1)
Sodium: 130 mmol/L — ABNORMAL LOW (ref 135–145)

## 2016-08-29 LAB — HEMOGLOBIN A1C
HEMOGLOBIN A1C: 5.9 % — AB (ref 4.8–5.6)
Mean Plasma Glucose: 123 mg/dL

## 2016-08-29 MED ORDER — WARFARIN SODIUM 2.5 MG PO TABS: 2.5000 mg | ORAL_TABLET | Freq: Once | ORAL | Status: DC

## 2016-08-29 MED ORDER — WARFARIN - PHARMACIST DOSING INPATIENT: Freq: Every day | Status: DC

## 2016-08-29 MED ORDER — HEPARIN (PORCINE) IN NACL 100-0.45 UNIT/ML-% IJ SOLN: 1050.0000 [IU]/h | INTRAMUSCULAR | Status: DC

## 2016-08-29 MED ORDER — ENOXAPARIN SODIUM 40 MG/0.4ML ~~LOC~~ SOLN: 40.0000 mg | SUBCUTANEOUS | 0 refills | Status: DC

## 2016-08-29 NOTE — Progress Notes (Addendum)
ANTICOAGULATION CONSULT NOTE - Follow Up Consult  Pharmacy Consult for Heparin and Coumadin Indication: Mechanical AVR  Allergies  Allergen Reactions  . Acetaminophen Other (See Comments)    Liver transplant recipient   . Codeine Itching  . Mirtazapine Other (See Comments)    hallucination    Patient Measurements: Height: 5\' 4"  (162.6 cm) (prior to BKA ) Weight: 97 lb 10.6 oz (44.3 kg) IBW/kg (Calculated) : 54.7  Vital Signs: Temp: 97.6 F (36.4 C) (07/20 1239) Temp Source: Oral (07/20 1239) BP: 144/60 (07/20 1239) Pulse Rate: 91 (07/20 1239)  Labs:  Recent Labs  08/27/16 0738 08/28/16 0526 08/29/16 0511  HGB 12.9 10.8* 10.9*  HCT 38.5 33.3* 33.4*  PLT 193 156 165  LABPROT 20.5* 22.7* 22.2*  INR 1.74 1.97 1.91  HEPARINUNFRC  --   --  0.12*  CREATININE 6.22* 3.62* 2.41*  TROPONINI 0.16*  --   --     Estimated Creatinine Clearance: 16.9 mL/min (A) (by C-G formula based on SCr of 2.41 mg/dL (H)).  Assessment: Heparin bridge while INR is sub-therapeutic, initial heparin level is low at 0.12, no issues per RN.  Heparin drip rate increased this AM to 900 units/hr.  Awaiting 8 hr Heparin level check today due now at 14:00.  INR was subtherapeutic on admit date 08/27/16.  Today INR 1.91, slight decreased.  Yesterday's dose was increased to 2.5 mg instead of her ususal dose of 1.25mg . On warfarin PTA for mechanical AVR (goal INR = 2.5-3.5). Home dose 2.5 mg on MWF and 1.25 mg AOD's per coumadin clinic note on 08/18/16.  Last pta dose taken on 08/26/16 pta.   Goal of Therapy:  INR = 2.5-3.5 per MD outpt  Heparin level 0.3-0.7 units/ml Monitor platelets by anticoagulation protocol: Yes   Plan:  heparin increased this AM to 900 units/hr--- see ADDENDUM below -f/u 8 h HL, pending -daily HL, INR, CBC  -Coumadin 2.5mg  po today x1.    Thank you for allowing pharmacy to be part of this patients care team. Nicole Cella, RPh Clinical Pharmacist Pager: [phone] 8A-4P  [phone] 4P-10P [phone] [place] [phone] 08/29/2016,2:30 PM   ADDENDUM:   8hour heparin level is 0.17 on 900 units/hr.  HL has increased slightly, however remains subtherapeutic. RN confirms heparin drip is infusing at 900 units/hr although not currently noted at this rate on the [facility].  No bleeding reported.   PLAN:   Increase heparin drip rate to 1050 units/hr F/u 6 hour heparin level tonight.   Nicole Cella, RPh Clinical Pharmacist Pager: [phone] 8A-4P [phone] 4P-10P [place] [phone]

## 2016-08-29 NOTE — Progress Notes (Signed)
[facility] Progress Note  Dialysis Orders:  Zavala 3 hr 30 min  EDW 44 kg HD MWF Bath 3K 2.25 Ca, Dialyzer  F180 Heparin none. Access R IJ TDC Calcitriol 0.5 mcg q treatment mircera 30 mcg q 4 weeks, to start 7/18.  Assessment/Plan: 1 SOB: Seems more likely volume overload. Breathing significantly improved with UF.  Blood cultures 7/18 now with 1/2 bottles growing Gm + cocci, identification pending.  Blood cultures re-drawn and these are pending.  Pt greatly improved after UF and has no other infectious signs/ symptoms-->agree with monitoring off abx.  Loading dose vanc/ Zosyn given 7/18.  Trop 0.16. Rest per primary. 2.  AMS- improved following HD, Back to baseline  3 ESRD: MWF HD, has not been getting to EDW, she needs an increased treatment time upon discharge to at least 3 hr 45 min 4 Hypertension: Hypertensive, improved with HD. UF to EDW today   5. Anemia of ESRD: mircera 30 mcg q 4 weeks to have started 7/18, will give Aranesp today 6. Metabolic Bone Disease: continue binders and VDRA 7.  Nutrition: appetite has been a problem recently, albumin 2.8, prostat 8. S/p AVR on Coumadin-- on hep gtt while INR subtherapeutic 9. DM 10: s/p liver transplant: on Prograf 11: Dispo: awaiting INR   Subjective:   Today is her birthday! Feeling well. Objective Vitals:   08/28/16 1858 08/28/16 1900 08/28/16 1955 08/29/16 0527  BP: 123/61 (!) 144/74 (!) 126/49 (!) 111/51  Pulse: 94 86 90 79  Resp: (!) 21  18 17   Temp: 98.4 F (36.9 C)  98.3 F (36.8 C) 98.4 F (36.9 C)  TempSrc:   Oral Oral  SpO2:   100% 98%  Weight: 44.3 kg (97 lb 10.6 oz)  44.3 kg (97 lb 10.6 oz)   Height:       Physical Exam General: Frail elderly AAF NAD  Heart: RRR mechanical click Lungs: Breathing unlabored CTAB  Abdomen: soft ND  Extremities: bilat BKA Dialysis Access: R IJ TDC    Madelon Lips MD [facility] pgr [phone] 08/29/2016,8:43 AM  LOS: 1 day    Additional Objective Labs: Basic Metabolic Panel:  Recent Labs Lab 08/27/16 0738 08/28/16 0526 08/29/16 0511  NA 131* 133* 130*  K 6.1* 4.1 3.4*  CL 92* 97* 95*  CO2 27 28 27   GLUCOSE 165* 191* 130*  BUN 31* 16 7  CREATININE 6.22* 3.62* 2.41*  CALCIUM 10.0 8.8* 8.7*   Liver Function Tests:  Recent Labs Lab 08/27/16 0738  AST 44*  ALT 24  ALKPHOS 314*  BILITOT 2.2*  PROT 9.8*  ALBUMIN 2.8*   No results for input(s): LIPASE, AMYLASE in the last 168 hours. CBC:  Recent Labs Lab 08/27/16 0738 08/28/16 0526 08/29/16 0511  WBC 6.5 6.5 6.9  NEUTROABS 3.2  --   --   HGB 12.9 10.8* 10.9*  HCT 38.5 33.3* 33.4*  MCV 87.1 88.1 87.4  PLT 193 156 165   Blood Culture    Component Value Date/Time   SDES BLOOD RIGHT FOREARM 08/28/2016 2041   SPECREQUEST  08/28/2016 2041    BOTTLES DRAWN AEROBIC ONLY Blood Culture adequate volume   CULT NO GROWTH < 12 HOURS 08/28/2016 2041   REPTSTATUS PENDING 08/28/2016 2041    Cardiac Enzymes:  Recent Labs Lab 08/27/16 0738  TROPONINI 0.16*   CBG:  Recent Labs Lab 08/28/16 0754 08/28/16 1156 08/28/16 1952 08/29/16 0746  GLUCAP 94 210* 94 90   Iron Studies: No  results for input(s): IRON, TIBC, TRANSFERRIN, FERRITIN in the last 72 hours. Lab Results  Component Value Date   INR 1.91 08/29/2016   INR 1.97 08/28/2016   INR 1.74 08/27/2016   Medications: . sodium chloride    . sodium chloride 100 mL (08/28/16 2127)  . sodium chloride 250 mL (08/28/16 2127)  . heparin 800 Units/hr (08/28/16 2127)   . aspirin EC  81 mg Oral Q1200  . calcitRIOL  0.5 mcg Oral Q M,W,F-HD  . calcitRIOL  0.5 mcg Oral Q M,W,F-HD  . calcium acetate  667 mg Oral TID WC  . carvedilol  3.125 mg Oral BID WC  . feeding supplement  1 Container Oral TID BM  . feeding supplement (PRO-STAT SUGAR FREE 64)  30 mL Oral BID  . insulin aspart  0-5 Units Subcutaneous QHS  . insulin aspart  0-9 Units Subcutaneous TID WC  . levothyroxine  175 mcg  Oral QAC breakfast  . metoCLOPramide  5 mg Oral BID AC  . multivitamin  1 tablet Oral QHS  . sodium chloride flush  3 mL Intravenous Q12H  . sodium chloride flush  3 mL Intravenous Q12H  . tacrolimus  2 mg Oral BID  . Warfarin - Physician Dosing Inpatient   Does not apply [phone]

## 2016-08-29 NOTE — Progress Notes (Signed)
Patient stated she is leaving AMA around 5:30 pm when her daughter gets off work. MD notified.

## 2016-08-29 NOTE — Care Management Note (Signed)
Case Management Note  Patient Details  Name: DERENDA GIDDINGS MRN: [id number] Date of Birth: [birth year]  Subjective/Objective:  CM following for progression and d/c planning.                   Action/Plan: 08/29/2016  Pt for d/c to home with [facility] services. Will change [place] agencies as [facility] is no longer able to provide [facility] services to this pt as they believe that the pt needs the services of a SNF. Pt has a very supportive family , who provide excellent care however pt would benefit for 24hr care. As this is a pt and family decision, this CM is arranging [facility] services with [facility].   Expected Discharge Date:                  Expected Discharge Plan:  Home/Self Care  In-House Referral:  NA  Discharge planning Services  CM Consult  Post Acute Care Choice:  NA Choice offered to:  NA  DME Arranged:  N/A DME Agency:  NA  HH Arranged:  RN, PT, Nurse's Aide, Social Work [employer] Agency:  NA  Status of Service:  Completed, signed off  If discussed at [employer], dates discussed:    Additional Comments:  Adron Bene, RN 08/29/2016, 1:38 PM

## 2016-08-29 NOTE — Discharge Summary (Signed)
[facility] Discharge Summary   Patient: [name] GYI:[id number]   PCP: System, Pcp Not In DOB: 1955/03/26   Date of admission: 08/27/2016   Date of discharge: 08/29/2016    Discharge Diagnoses:  Active Problems:   ESRF (end stage renal failure) (HCC)   ESRD (end stage renal disease) ([place])   Discharge Condition: stable  History of present illness: As per the H and P dictated on admission, " [name] is a 61 y.o. female with medical history significant of ESRF on Dialysis MWF in [place], [place], mechanical aortic valve, type 2 diabtes, CAD and bil BKA  Presented to the Emergency department today sith acute shortness of breath.  Pt reports she had dialyisis on Monday but did not get to her dry weight.  Pt reports they miscalculated the weight of her wheelchair. Pt was seen by Dr. Hollie Salk with Nephrology and placed on dialysis.  Pt reports she is breathing better now.   Pt denies cough or congestion.  No fever or chills"  Hospital Course:  1. Acute on chronic diastolic dysfunction  Acute hypoxic procedure failure. ESRD on hemodialysis. Appreciate nephrology input. Basic problem is that the patient has not been getting to her EDW. Nephrology is requesting the patient to extend the duration of HD, patient currently is agreeing to it. In the hospital the patient is receiving daily HD now  2. Mechanical aortic valve. Subtherapeutic INR. Patient will need to be bridged with heparin. Pharmacy consulted. Continue Coumadin. Patient mentions that she would like to go home with Lovenox as she has been given this medication in the past. On chart review back in March 2016 on the patient received Lovenox patient had severe bleeding from her fistula and required hospitalization. Patient was given Lovenox back in November 2017 as well as June 2018 and was recently discharged back on Lovenox and beginning of July 2018. Lovenox has not been approved for DVT or mechanical valve in hemodialysis  patient, family was informed that this is not recommended use and can lead to life-threatening bleeding. Discussed with patient's daughter on phone as well as patient. Patient in her sound mind understood all the recommendation, has threatened to leave AMA.  To ensure that the patient does have some sort of anticoagulation  coverage 4 doses of Lovenox has been provided. Her dose based on her 44 kg weight, would ideally be 40 mg every 24 hours. Patient recommended to follow-up with Coumadin clinic.  3. Bacteremia. Patient has 1 out of 2 blood cultures positive for gram-positive cocci. BCID is unable to identify the bacteria. Patient does not have any evidence of sepsis or infection. Discussed with infectious disease on-call, dr comer, organism has been identified as Micrococcus. Repeat cultures are negative. Patient still does not have any evidence of infection. Based on this patient does not require any medications or antibiotics at present. We will currently monitor off of antibiotic,  Essential hypertension Continue home regimen.  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  Procedures and Results:  hd   Consultations:  Nephrology  The results of significant diagnostics from this  hospitalization (including imaging, microbiology, ancillary and laboratory) are listed below for reference.    Significant Diagnostic Studies: Dg Chest 2 View  Result Date: 08/27/2016 CLINICAL DATA:  Dyspnea EXAM: CHEST  2 VIEW COMPARISON:  08/09/2016 FINDINGS: Pulmonary edema and pleural effusions that are small volume. Right pleural  effusion is likely partially loculated laterally, also seen previously. Status post aortic valve replacement. Dialysis catheter on the right with tip at the right atrium. No pneumothorax. EKG leads create artifact over the chest. IMPRESSION: Pulmonary edema and small pleural effusions. Electronically Signed   By: Monte Fantasia M.D.   On: 08/27/2016 08:16   Portable Chest 1 View  Result Date: 08/09/2016 CLINICAL DATA:  Shortness of breath. EXAM: PORTABLE CHEST 1 VIEW COMPARISON:  August 08, 2016 FINDINGS: Stable PermCath. No pneumothorax. The cardiomediastinal silhouette is unchanged. There is a tiny left effusion with underlying atelectasis. There is a moderate right effusion which is larger in the interval. Probable asymmetric edema, right greater than left. IMPRESSION: Bilateral pleural effusions, right greater than left with probable asymmetric edema, right greater than left. The findings have worsened in the interval. Electronically Signed   By: Dorise Bullion III M.D   On: 08/09/2016 18:55    Microbiology: Recent Results (from the past 240 hour(s))  Culture, blood (routine x 2)     Status: Abnormal (Preliminary result)   Collection Time: 08/27/16  8:05 AM  Result Value Ref Range Status   Specimen Description BLOOD HEMODIALYSIS CATHETER  Final   Special Requests   Final    BOTTLES DRAWN AEROBIC AND ANAEROBIC Blood Culture adequate volume   Culture  Setup Time   Final    GRAM POSITIVE COCCI AEROBIC BOTTLE ONLY CRITICAL RESULT CALLED TO, READ BACK BY AND VERIFIED WITH: ABjorn Loser Pharm.D.  16:40 08/28/16 ([email or web address])    Culture (A)  Final    MICROCOCCUS SPECIES Standardized susceptibility testing for this organism is not available.    Report Status PENDING  Incomplete  Blood Culture ID Panel (Reflexed)     Status: None   Collection Time: 08/27/16  8:05 AM  Result Value Ref Range Status   Enterococcus species NOT DETECTED NOT DETECTED Final   Listeria monocytogenes NOT DETECTED NOT DETECTED  Final   Staphylococcus species NOT DETECTED NOT DETECTED Final   Staphylococcus aureus NOT DETECTED NOT DETECTED Final   Streptococcus species NOT DETECTED NOT DETECTED Final   Streptococcus agalactiae NOT DETECTED NOT DETECTED Final   Streptococcus pneumoniae NOT DETECTED NOT DETECTED Final   Streptococcus pyogenes NOT DETECTED NOT DETECTED Final   Acinetobacter baumannii NOT DETECTED NOT DETECTED Final   Enterobacteriaceae species NOT DETECTED NOT DETECTED Final   Enterobacter cloacae complex NOT DETECTED NOT DETECTED Final   Escherichia coli NOT DETECTED NOT DETECTED Final   Klebsiella oxytoca NOT DETECTED NOT DETECTED Final   Klebsiella pneumoniae NOT DETECTED NOT DETECTED Final   Proteus species NOT DETECTED NOT DETECTED Final   Serratia marcescens NOT DETECTED NOT DETECTED Final   Haemophilus influenzae NOT DETECTED NOT DETECTED Final   Neisseria meningitidis NOT DETECTED NOT DETECTED Final   Pseudomonas aeruginosa NOT DETECTED NOT DETECTED Final   Candida albicans NOT DETECTED NOT DETECTED Final   Candida glabrata NOT DETECTED NOT DETECTED Final   Candida krusei NOT DETECTED NOT DETECTED Final   Candida parapsilosis NOT DETECTED NOT DETECTED Final   Candida tropicalis NOT DETECTED NOT DETECTED Final  Culture, blood (routine x 2)     Status: None (Preliminary result)   Collection  Time: 08/27/16  8:20 AM  Result Value Ref Range Status   Specimen Description BLOOD HEMODIALYSIS CATHETER  Final   Special Requests   Final    BOTTLES DRAWN AEROBIC AND ANAEROBIC Blood Culture adequate volume   Culture NO GROWTH 2 DAYS  Final   Report Status PENDING  Incomplete  Culture, blood (routine x 2)     Status: None (Preliminary result)   Collection Time: 08/28/16  8:41 PM  Result Value Ref Range Status   Specimen Description BLOOD RIGHT FOREARM  Final   Special Requests   Final    BOTTLES DRAWN AEROBIC ONLY Blood Culture adequate volume   Culture NO GROWTH < 12 HOURS  Final   Report  Status PENDING  Incomplete     Labs: CBC:  Recent Labs Lab 08/27/16 0738 08/28/16 0526 08/29/16 0511  WBC 6.5 6.5 6.9  NEUTROABS 3.2  --   --   HGB 12.9 10.8* 10.9*  HCT 38.5 33.3* 33.4*  MCV 87.1 88.1 87.4  PLT 193 156 992   Basic Metabolic Panel:  Recent Labs Lab 08/27/16 0738 08/28/16 0526 08/29/16 0511  NA 131* 133* 130*  K 6.1* 4.1 3.4*  CL 92* 97* 95*  CO2 27 28 27   GLUCOSE 165* 191* 130*  BUN 31* 16 7  CREATININE 6.22* 3.62* 2.41*  CALCIUM 10.0 8.8* 8.7*   Liver Function Tests:  Recent Labs Lab 08/27/16 0738  AST 44*  ALT 24  ALKPHOS 314*  BILITOT 2.2*  PROT 9.8*  ALBUMIN 2.8*   No results for input(s): LIPASE, AMYLASE in the last 168 hours. No results for input(s): AMMONIA in the last 168 hours. Cardiac Enzymes:  Recent Labs Lab 08/27/16 0738  TROPONINI 0.16*   BNP (last 3 results)  Recent Labs  08/09/16 1930 08/27/16 0738  BNP >4,500.0* >4,500.0*   CBG:  Recent Labs Lab 08/28/16 1156 08/28/16 1952 08/29/16 0746 08/29/16 1243 08/29/16 1658  GLUCAP 210* 94 90 72 172*   Time spent: 35 minutes  Signed:  Sabriel Borromeo  [facility] 08/29/2016, 5:36 PM

## 2016-08-29 NOTE — Progress Notes (Signed)
ANTICOAGULATION CONSULT NOTE - Follow Up Consult  Pharmacy Consult for Heparin  Indication: Mechanical AVR  Allergies  Allergen Reactions  . Acetaminophen Other (See Comments)    Liver transplant recipient   . Codeine Itching  . Mirtazapine Other (See Comments)    hallucination    Patient Measurements: Height: 5\' 4"  (162.6 cm) (prior to BKA ) Weight: 97 lb 10.6 oz (44.3 kg) IBW/kg (Calculated) : 54.7  Vital Signs: Temp: 98.4 F (36.9 C) (07/20 0527) Temp Source: Oral (07/20 0527) BP: 111/51 (07/20 0527) Pulse Rate: 79 (07/20 0527)  Labs:  Recent Labs  08/27/16 0738 08/28/16 0526 08/29/16 0511  HGB 12.9 10.8* 10.9*  HCT 38.5 33.3* 33.4*  PLT 193 156 165  LABPROT 20.5* 22.7* 22.2*  INR 1.74 1.97 1.91  HEPARINUNFRC  --   --  0.12*  CREATININE 6.22* 3.62*  --   TROPONINI 0.16*  --   --     Estimated Creatinine Clearance: 11.3 mL/min (A) (by C-G formula based on SCr of 3.62 mg/dL (H)).  Assessment: Heparin bridge while INR is sub-therapeutic, initial heparin level is low at 0.12, no issues per RN.   Goal of Therapy:  Heparin level 0.3-0.7 units/ml Monitor platelets by anticoagulation protocol: Yes   Plan:  -Inc heparin to 900 units/hr -1400 HL  [name] 08/29/2016,6:00 AM

## 2016-08-29 NOTE — Procedures (Signed)
Patient seen and examined on Hemodialysis. QB 350 R IJ TDC UF goal 0.5L  Treatment adjusted as needed.  Madelon Lips MD [facility] pgr [phone] 8:52 AM

## 2016-08-29 NOTE — Progress Notes (Signed)
Patient signed [employer] Advice form.  Form placed in chart.

## 2016-08-29 NOTE — Progress Notes (Signed)
[facility] Progress Note  Patient: [name] RFF:[id number]   PCP: System, Pcp Not In DOB: 1955-09-09   DOA: 08/27/2016   DOS: 08/29/2016   Date of Service: the patient was seen and examined on 08/29/2016  Subjective: No acute complaint. No nausea no vomiting.  Brief hospital course: Pt. with PMH of ESRD on HD, CHF, mechanical aortic valve, type II DM, CAD, bilateral BKA; admitted on 08/27/2016, presented with complaint of fatigue as well as shortness of breath, was found to have acute hypoxic restriction failure due to acute on chronic diastolic CHF. Currently further plan is continue hemodialysis.  Assessment and Plan: 1. Acute on chronic diastolic dysfunction  Acute hypoxic procedure failure. ESRD on hemodialysis. Appreciate nephrology input. Basic problem is that the patient has not been getting to her EDW. Nephrology is requesting the patient to extend the duration of HD, patient currently is agreeing to it. In the hospital the patient is receiving daily HD now  2. Mechanical aortic valve. Subtherapeutic INR. Patient will need to be bridged with heparin. Pharmacy consulted. Continue Coumadin. Patient mentions that she would like to go home with Lovenox as she has been given this medication in the past. On chart review back in March 2016 on the patient received Lovenox patient had severe bleeding from her fistula and required hospitalization. Patient was given Lovenox back in November 2017 as well as June 2018 and was recently discharged back on Lovenox and beginning of July 2018. Lovenox has not been approved for DVT or mechanical valve in hemodialysis patient, family was informed that this is not recommended use and can lead to life-threatening bleeding. Discussed with patient's daughter on phone as well as patient. Patient in her sound mind understood all the recommendation, has threatened to leave AMA.  3. Bacteremia. Patient has 1 out of 2 blood cultures positive for  gram-positive cocci. BCID is unable to identify the bacteria. Patient does not have any evidence of sepsis or infection. Discussed with infectious disease on-call, dr comer, organism has been identified as Micrococcus. Repeat cultures are negative. Patient still does not have any evidence of infection. Based on this patient does not require any medications or antibiotics at present. We will currently monitor off of antibiotic,  Essential hypertension Continue home regimen.  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  Advance goals of care discussion: DNR DNI  Family Communication: no family was present at bedside, at the time of interview.  Disposition:  Discharge to BE DETERMINED.[name]  Consultants: nephrology Procedures: HS  Antibiotics: Anti-infectives    Start     Dose/Rate Route Frequency Ordered Stop   08/27/16 1045  piperacillin-tazobactam (ZOSYN) IVPB 3.375 g     3.375 g 100 mL/hr over 30 Minutes Intravenous  Once 08/27/16 0821 08/27/16 1119   08/27/16 0830  vancomycin (VANCOCIN) IVPB 1000 mg/200 mL premix     1,000 mg 200 mL/hr over 60 Minutes Intravenous  Once 08/27/16 0821 08/27/16 1151       Objective: Physical Exam: Vitals:   08/29/16 1130 08/29/16 1200 08/29/16 1213 08/29/16 1239  BP: 137/73 (!) 154/55 (!) 149/69 (!) 144/60  Pulse: 83 93 76 91  Resp: 18 16 16 20   Temp:   97.9 F (36.6 C) 97.6 F (36.4 C)  TempSrc:    Oral  SpO2:  Weight:      Height:        Intake/Output Summary (Last 24 hours) at 08/29/16 1712 Last data filed at 08/29/16 1545  Gross per 24 hour  Intake          7688.85 ml  Output             2000 ml  Net          5688.85 ml   Filed Weights   08/28/16 1523 08/28/16 1858 08/28/16 1955  Weight: 45.3 kg (99 lb 13.9 oz) 44.3 kg (97 lb  10.6 oz) 44.3 kg (97 lb 10.6 oz)   General: Alert, Awake and Oriented to Time, Place and Person. Appear in mild distress, affect appropriate Eyes: PERRL, Conjunctiva normal ENT: Oral Mucosa clear moist. Neck: no JVD, no Abnormal Mass Or lumps Cardiovascular: S1 and S2 Present, aortic systolic Murmur, Peripheral Pulses Present Respiratory: normal respiratory effort, Bilateral Air entry equal and Decreased, no use of accessory muscle, Clear to Auscultation, no Crackles, no wheezes Abdomen: Bowel Sound [present, Soft and no tenderness, no hernia Skin: no redness, no Rash, no induration Extremities: bilateral BKA Neurologic: Grossly no focal neuro deficit. Bilaterally Equal motor strength  Data Reviewed: CBC:  Recent Labs Lab 08/27/16 0738 08/28/16 0526 08/29/16 0511  WBC 6.5 6.5 6.9  NEUTROABS 3.2  --   --   HGB 12.9 10.8* 10.9*  HCT 38.5 33.3* 33.4*  MCV 87.1 88.1 87.4  PLT 193 156 008   Basic Metabolic Panel:  Recent Labs Lab 08/27/16 0738 08/28/16 0526 08/29/16 0511  NA 131* 133* 130*  K 6.1* 4.1 3.4*  CL 92* 97* 95*  CO2 27 28 27   GLUCOSE 165* 191* 130*  BUN 31* 16 7  CREATININE 6.22* 3.62* 2.41*  CALCIUM 10.0 8.8* 8.7*    Liver Function Tests:  Recent Labs Lab 08/27/16 0738  AST 44*  ALT 24  ALKPHOS 314*  BILITOT 2.2*  PROT 9.8*  ALBUMIN 2.8*   No results for input(s): LIPASE, AMYLASE in the last 168 hours. No results for input(s): AMMONIA in the last 168 hours. Coagulation Profile:  Recent Labs Lab 08/27/16 0738 08/28/16 0526 08/29/16 0511  INR 1.74 1.97 1.91   Cardiac Enzymes:  Recent Labs Lab 08/27/16 0738  TROPONINI 0.16*   BNP (last 3 results) No results for input(s): PROBNP in the last 8760 hours. CBG:  Recent Labs Lab 08/28/16 1156 08/28/16 1952 08/29/16 0746 08/29/16 1243 08/29/16 1658  GLUCAP 210* 94 90 72 172*   Studies: No results found.  Scheduled Meds: . aspirin EC  81 mg Oral Q1200  . calcitRIOL  0.5 mcg Oral  Q M,W,F-HD  . calcitRIOL  0.5 mcg Oral Q M,W,F-HD  . calcium acetate  667 mg Oral TID WC  . carvedilol  3.125 mg Oral BID WC  . feeding supplement  1 Container Oral TID BM  . feeding supplement (PRO-STAT SUGAR FREE 64)  30 mL Oral BID  . insulin aspart  0-5 Units Subcutaneous QHS  . insulin aspart  0-9 Units Subcutaneous TID WC  . levothyroxine  175 mcg Oral QAC breakfast  . metoCLOPramide  5 mg Oral BID AC  . multivitamin  1 tablet Oral QHS  . sodium chloride flush  3 mL Intravenous Q12H  . sodium chloride flush  3 mL Intravenous Q12H  . tacrolimus  2 mg Oral BID  . warfarin  2.5 mg Oral ONCE-1800  . Warfarin - Pharmacist Dosing Inpatient   Does not  apply q1800   Continuous Infusions: . sodium chloride    . sodium chloride 100 mL (08/28/16 2127)  . sodium chloride 250 mL (08/28/16 2127)  . heparin 1,050 Units/hr (08/29/16 1545)   PRN Meds: sodium chloride, sodium chloride, sodium chloride, alteplase, heparin, ipratropium-albuterol, lidocaine (PF), lidocaine-prilocaine, methocarbamol, nitroGLYCERIN, ondansetron, oxyCODONE, pentafluoroprop-tetrafluoroeth, senna-docusate, sodium chloride flush  Time spent: 40 minutes  Author: Berle Mull, MD [facility] Pager: [phone] 08/29/2016 5:12 PM  If 7PM-7AM, please contact night-coverage at [email or web address], password [facility]

## 2016-08-30 ENCOUNTER — Telehealth (HOSPITAL_BASED_OUTPATIENT_CLINIC_OR_DEPARTMENT_OTHER): Payer: Self-pay | Admitting: *Deleted

## 2016-08-30 LAB — BLOOD CULTURE ID PANEL (REFLEXED)
Acinetobacter baumannii: NOT DETECTED
CANDIDA KRUSEI: NOT DETECTED
CANDIDA PARAPSILOSIS: NOT DETECTED
Candida albicans: NOT DETECTED
Candida glabrata: NOT DETECTED
Candida tropicalis: NOT DETECTED
ESCHERICHIA COLI: NOT DETECTED
Enterobacter cloacae complex: NOT DETECTED
Enterobacteriaceae species: NOT DETECTED
Enterococcus species: NOT DETECTED
Haemophilus influenzae: NOT DETECTED
KLEBSIELLA OXYTOCA: NOT DETECTED
Klebsiella pneumoniae: NOT DETECTED
Listeria monocytogenes: NOT DETECTED
Methicillin resistance: DETECTED — AB
Neisseria meningitidis: NOT DETECTED
PROTEUS SPECIES: NOT DETECTED
PSEUDOMONAS AERUGINOSA: NOT DETECTED
SERRATIA MARCESCENS: NOT DETECTED
STAPHYLOCOCCUS AUREUS BCID: NOT DETECTED
STAPHYLOCOCCUS SPECIES: DETECTED — AB
STREPTOCOCCUS PNEUMONIAE: NOT DETECTED
Streptococcus agalactiae: NOT DETECTED
Streptococcus pyogenes: NOT DETECTED
Streptococcus species: NOT DETECTED

## 2016-08-30 LAB — CULTURE, BLOOD (ROUTINE X 2): Special Requests: ADEQUATE

## 2016-08-31 LAB — CULTURE, BLOOD (ROUTINE X 2): Special Requests: ADEQUATE

## 2016-09-01 DIAGNOSIS — D631 Anemia in chronic kidney disease: Secondary | ICD-10-CM | POA: Diagnosis not present

## 2016-09-01 DIAGNOSIS — E876 Hypokalemia: Secondary | ICD-10-CM | POA: Diagnosis not present

## 2016-09-01 DIAGNOSIS — N186 End stage renal disease: Secondary | ICD-10-CM | POA: Diagnosis not present

## 2016-09-01 DIAGNOSIS — N2581 Secondary hyperparathyroidism of renal origin: Secondary | ICD-10-CM | POA: Diagnosis not present

## 2016-09-01 DIAGNOSIS — E118 Type 2 diabetes mellitus with unspecified complications: Secondary | ICD-10-CM | POA: Diagnosis not present

## 2016-09-01 LAB — CULTURE, BLOOD (ROUTINE X 2)
Culture: NO GROWTH
SPECIAL REQUESTS: ADEQUATE

## 2016-09-02 ENCOUNTER — Encounter: Payer: Self-pay | Admitting: Vascular Surgery

## 2016-09-03 DIAGNOSIS — N186 End stage renal disease: Secondary | ICD-10-CM | POA: Diagnosis not present

## 2016-09-03 DIAGNOSIS — N2581 Secondary hyperparathyroidism of renal origin: Secondary | ICD-10-CM | POA: Diagnosis not present

## 2016-09-03 DIAGNOSIS — E039 Hypothyroidism, unspecified: Secondary | ICD-10-CM | POA: Diagnosis not present

## 2016-09-03 DIAGNOSIS — E118 Type 2 diabetes mellitus with unspecified complications: Secondary | ICD-10-CM | POA: Diagnosis not present

## 2016-09-03 DIAGNOSIS — E876 Hypokalemia: Secondary | ICD-10-CM | POA: Diagnosis not present

## 2016-09-03 DIAGNOSIS — D631 Anemia in chronic kidney disease: Secondary | ICD-10-CM | POA: Diagnosis not present

## 2016-09-03 LAB — CULTURE, BLOOD (ROUTINE X 2)
Culture: NO GROWTH
Special Requests: ADEQUATE

## 2016-09-05 DIAGNOSIS — D631 Anemia in chronic kidney disease: Secondary | ICD-10-CM | POA: Diagnosis not present

## 2016-09-05 DIAGNOSIS — E118 Type 2 diabetes mellitus with unspecified complications: Secondary | ICD-10-CM | POA: Diagnosis not present

## 2016-09-05 DIAGNOSIS — N186 End stage renal disease: Secondary | ICD-10-CM | POA: Diagnosis not present

## 2016-09-05 DIAGNOSIS — E876 Hypokalemia: Secondary | ICD-10-CM | POA: Diagnosis not present

## 2016-09-05 DIAGNOSIS — N2581 Secondary hyperparathyroidism of renal origin: Secondary | ICD-10-CM | POA: Diagnosis not present

## 2016-09-08 DIAGNOSIS — E118 Type 2 diabetes mellitus with unspecified complications: Secondary | ICD-10-CM | POA: Diagnosis not present

## 2016-09-08 DIAGNOSIS — E876 Hypokalemia: Secondary | ICD-10-CM | POA: Diagnosis not present

## 2016-09-08 DIAGNOSIS — I359 Nonrheumatic aortic valve disorder, unspecified: Secondary | ICD-10-CM | POA: Diagnosis not present

## 2016-09-08 DIAGNOSIS — Z7901 Long term (current) use of anticoagulants: Secondary | ICD-10-CM | POA: Diagnosis not present

## 2016-09-08 DIAGNOSIS — N186 End stage renal disease: Secondary | ICD-10-CM | POA: Diagnosis not present

## 2016-09-08 DIAGNOSIS — N2581 Secondary hyperparathyroidism of renal origin: Secondary | ICD-10-CM | POA: Diagnosis not present

## 2016-09-08 DIAGNOSIS — D631 Anemia in chronic kidney disease: Secondary | ICD-10-CM | POA: Diagnosis not present

## 2016-09-08 LAB — PROTIME-INR: INR: 5.2 — AB (ref <=1.1)

## 2016-09-09 DIAGNOSIS — Z992 Dependence on renal dialysis: Secondary | ICD-10-CM | POA: Diagnosis not present

## 2016-09-09 DIAGNOSIS — Z993 Dependence on wheelchair: Secondary | ICD-10-CM

## 2016-09-09 DIAGNOSIS — N186 End stage renal disease: Secondary | ICD-10-CM | POA: Diagnosis not present

## 2016-09-09 DIAGNOSIS — F17210 Nicotine dependence, cigarettes, uncomplicated: Secondary | ICD-10-CM

## 2016-09-09 DIAGNOSIS — E1142 Type 2 diabetes mellitus with diabetic polyneuropathy: Secondary | ICD-10-CM | POA: Diagnosis not present

## 2016-09-09 DIAGNOSIS — Z89511 Acquired absence of right leg below knee: Secondary | ICD-10-CM

## 2016-09-09 DIAGNOSIS — I2510 Atherosclerotic heart disease of native coronary artery without angina pectoris: Secondary | ICD-10-CM | POA: Diagnosis not present

## 2016-09-09 DIAGNOSIS — M19042 Primary osteoarthritis, left hand: Secondary | ICD-10-CM

## 2016-09-09 DIAGNOSIS — J449 Chronic obstructive pulmonary disease, unspecified: Secondary | ICD-10-CM | POA: Diagnosis not present

## 2016-09-09 DIAGNOSIS — D631 Anemia in chronic kidney disease: Secondary | ICD-10-CM | POA: Diagnosis not present

## 2016-09-09 DIAGNOSIS — F419 Anxiety disorder, unspecified: Secondary | ICD-10-CM

## 2016-09-09 DIAGNOSIS — Z7982 Long term (current) use of aspirin: Secondary | ICD-10-CM

## 2016-09-09 DIAGNOSIS — G546 Phantom limb syndrome with pain: Secondary | ICD-10-CM | POA: Diagnosis not present

## 2016-09-09 DIAGNOSIS — Z7901 Long term (current) use of anticoagulants: Secondary | ICD-10-CM

## 2016-09-09 DIAGNOSIS — Z944 Liver transplant status: Secondary | ICD-10-CM

## 2016-09-09 DIAGNOSIS — I132 Hypertensive heart and chronic kidney disease with heart failure and with stage 5 chronic kidney disease, or end stage renal disease: Secondary | ICD-10-CM | POA: Diagnosis not present

## 2016-09-09 DIAGNOSIS — F329 Major depressive disorder, single episode, unspecified: Secondary | ICD-10-CM

## 2016-09-09 DIAGNOSIS — Z794 Long term (current) use of insulin: Secondary | ICD-10-CM

## 2016-09-09 DIAGNOSIS — Z952 Presence of prosthetic heart valve: Secondary | ICD-10-CM

## 2016-09-09 DIAGNOSIS — E1151 Type 2 diabetes mellitus with diabetic peripheral angiopathy without gangrene: Secondary | ICD-10-CM | POA: Diagnosis not present

## 2016-09-09 DIAGNOSIS — L89152 Pressure ulcer of sacral region, stage 2: Secondary | ICD-10-CM | POA: Diagnosis not present

## 2016-09-09 DIAGNOSIS — T8640 Unspecified complication of liver transplant: Secondary | ICD-10-CM | POA: Diagnosis not present

## 2016-09-09 DIAGNOSIS — E1122 Type 2 diabetes mellitus with diabetic chronic kidney disease: Secondary | ICD-10-CM | POA: Diagnosis not present

## 2016-09-09 DIAGNOSIS — Z89512 Acquired absence of left leg below knee: Secondary | ICD-10-CM

## 2016-09-09 DIAGNOSIS — Z4781 Encounter for orthopedic aftercare following surgical amputation: Secondary | ICD-10-CM | POA: Diagnosis not present

## 2016-09-09 DIAGNOSIS — I5042 Chronic combined systolic (congestive) and diastolic (congestive) heart failure: Secondary | ICD-10-CM | POA: Diagnosis not present

## 2016-09-10 ENCOUNTER — Ambulatory Visit (INDEPENDENT_AMBULATORY_CARE_PROVIDER_SITE_OTHER): Payer: Medicare Other | Admitting: Pharmacist

## 2016-09-10 DIAGNOSIS — E876 Hypokalemia: Secondary | ICD-10-CM | POA: Diagnosis not present

## 2016-09-10 DIAGNOSIS — Z7901 Long term (current) use of anticoagulants: Secondary | ICD-10-CM

## 2016-09-10 DIAGNOSIS — E118 Type 2 diabetes mellitus with unspecified complications: Secondary | ICD-10-CM | POA: Diagnosis not present

## 2016-09-10 DIAGNOSIS — D631 Anemia in chronic kidney disease: Secondary | ICD-10-CM | POA: Diagnosis not present

## 2016-09-10 DIAGNOSIS — N2581 Secondary hyperparathyroidism of renal origin: Secondary | ICD-10-CM | POA: Diagnosis not present

## 2016-09-10 DIAGNOSIS — N186 End stage renal disease: Secondary | ICD-10-CM | POA: Diagnosis not present

## 2016-09-12 ENCOUNTER — Other Ambulatory Visit: Payer: Self-pay | Admitting: Pharmacist Clinician (PhC)/ Clinical Pharmacy Specialist

## 2016-09-12 DIAGNOSIS — D631 Anemia in chronic kidney disease: Secondary | ICD-10-CM | POA: Diagnosis not present

## 2016-09-12 DIAGNOSIS — N2581 Secondary hyperparathyroidism of renal origin: Secondary | ICD-10-CM | POA: Diagnosis not present

## 2016-09-12 DIAGNOSIS — E876 Hypokalemia: Secondary | ICD-10-CM | POA: Diagnosis not present

## 2016-09-12 DIAGNOSIS — N186 End stage renal disease: Secondary | ICD-10-CM | POA: Diagnosis not present

## 2016-09-12 DIAGNOSIS — E118 Type 2 diabetes mellitus with unspecified complications: Secondary | ICD-10-CM | POA: Diagnosis not present

## 2016-09-12 MED ORDER — WARFARIN SODIUM 2 MG PO TABS: ORAL_TABLET | ORAL | 1 refills | Status: DC

## 2016-09-15 DIAGNOSIS — N2581 Secondary hyperparathyroidism of renal origin: Secondary | ICD-10-CM | POA: Diagnosis not present

## 2016-09-15 DIAGNOSIS — E118 Type 2 diabetes mellitus with unspecified complications: Secondary | ICD-10-CM | POA: Diagnosis not present

## 2016-09-15 DIAGNOSIS — Z7901 Long term (current) use of anticoagulants: Secondary | ICD-10-CM | POA: Diagnosis not present

## 2016-09-15 DIAGNOSIS — E876 Hypokalemia: Secondary | ICD-10-CM | POA: Diagnosis not present

## 2016-09-15 DIAGNOSIS — N186 End stage renal disease: Secondary | ICD-10-CM | POA: Diagnosis not present

## 2016-09-15 DIAGNOSIS — I359 Nonrheumatic aortic valve disorder, unspecified: Secondary | ICD-10-CM | POA: Diagnosis not present

## 2016-09-15 DIAGNOSIS — D631 Anemia in chronic kidney disease: Secondary | ICD-10-CM | POA: Diagnosis not present

## 2016-09-15 LAB — PROTIME-INR: INR: 2.4 — AB (ref 0.9–1.1)

## 2016-09-17 ENCOUNTER — Emergency Department (HOSPITAL_COMMUNITY)
Admission: EM | Admit: 2016-09-17 | Discharge: 2016-09-18 | Disposition: A | Discharge status: Home / Self Care | Payer: Medicare Other | Attending: Emergency Medicine | Admitting: Emergency Medicine

## 2016-09-17 ENCOUNTER — Ambulatory Visit (INDEPENDENT_AMBULATORY_CARE_PROVIDER_SITE_OTHER): Payer: Medicare Other | Admitting: Pharmacist

## 2016-09-17 DIAGNOSIS — I132 Hypertensive heart and chronic kidney disease with heart failure and with stage 5 chronic kidney disease, or end stage renal disease: Secondary | ICD-10-CM | POA: Insufficient documentation

## 2016-09-17 DIAGNOSIS — J45909 Unspecified asthma, uncomplicated: Secondary | ICD-10-CM | POA: Insufficient documentation

## 2016-09-17 DIAGNOSIS — I2510 Atherosclerotic heart disease of native coronary artery without angina pectoris: Secondary | ICD-10-CM | POA: Diagnosis not present

## 2016-09-17 DIAGNOSIS — E039 Hypothyroidism, unspecified: Secondary | ICD-10-CM | POA: Insufficient documentation

## 2016-09-17 DIAGNOSIS — R110 Nausea: Secondary | ICD-10-CM

## 2016-09-17 DIAGNOSIS — J449 Chronic obstructive pulmonary disease, unspecified: Secondary | ICD-10-CM | POA: Insufficient documentation

## 2016-09-17 DIAGNOSIS — D631 Anemia in chronic kidney disease: Secondary | ICD-10-CM | POA: Diagnosis not present

## 2016-09-17 DIAGNOSIS — E118 Type 2 diabetes mellitus with unspecified complications: Secondary | ICD-10-CM | POA: Diagnosis not present

## 2016-09-17 DIAGNOSIS — I509 Heart failure, unspecified: Secondary | ICD-10-CM | POA: Insufficient documentation

## 2016-09-17 DIAGNOSIS — N186 End stage renal disease: Secondary | ICD-10-CM | POA: Diagnosis not present

## 2016-09-17 DIAGNOSIS — Z79899 Other long term (current) drug therapy: Secondary | ICD-10-CM | POA: Insufficient documentation

## 2016-09-17 DIAGNOSIS — E1140 Type 2 diabetes mellitus with diabetic neuropathy, unspecified: Secondary | ICD-10-CM | POA: Diagnosis not present

## 2016-09-17 DIAGNOSIS — E1122 Type 2 diabetes mellitus with diabetic chronic kidney disease: Secondary | ICD-10-CM | POA: Insufficient documentation

## 2016-09-17 DIAGNOSIS — Z7901 Long term (current) use of anticoagulants: Secondary | ICD-10-CM

## 2016-09-17 DIAGNOSIS — Z951 Presence of aortocoronary bypass graft: Secondary | ICD-10-CM | POA: Diagnosis not present

## 2016-09-17 DIAGNOSIS — F17210 Nicotine dependence, cigarettes, uncomplicated: Secondary | ICD-10-CM | POA: Diagnosis not present

## 2016-09-17 DIAGNOSIS — E876 Hypokalemia: Secondary | ICD-10-CM | POA: Diagnosis not present

## 2016-09-17 DIAGNOSIS — Z7982 Long term (current) use of aspirin: Secondary | ICD-10-CM | POA: Diagnosis not present

## 2016-09-17 DIAGNOSIS — R404 Transient alteration of awareness: Secondary | ICD-10-CM | POA: Diagnosis not present

## 2016-09-17 DIAGNOSIS — Z992 Dependence on renal dialysis: Secondary | ICD-10-CM | POA: Insufficient documentation

## 2016-09-17 DIAGNOSIS — R531 Weakness: Secondary | ICD-10-CM | POA: Diagnosis not present

## 2016-09-17 DIAGNOSIS — N2581 Secondary hyperparathyroidism of renal origin: Secondary | ICD-10-CM | POA: Diagnosis not present

## 2016-09-17 DIAGNOSIS — R112 Nausea with vomiting, unspecified: Secondary | ICD-10-CM | POA: Diagnosis present

## 2016-09-17 DIAGNOSIS — I120 Hypertensive chronic kidney disease with stage 5 chronic kidney disease or end stage renal disease: Secondary | ICD-10-CM | POA: Diagnosis not present

## 2016-09-17 MED ORDER — MORPHINE SULFATE (PF) 4 MG/ML IV SOLN
4.0000 mg | Freq: Once | INTRAVENOUS | Status: AC
  Administered 2016-09-17: 4 mg via INTRAVENOUS
  Filled 2016-09-17: qty 1

## 2016-09-17 MED ORDER — PROMETHAZINE HCL 25 MG/ML IJ SOLN
12.5000 mg | Freq: Once | INTRAMUSCULAR | Status: AC
  Administered 2016-09-17: 12.5 mg via INTRAVENOUS
  Filled 2016-09-17: qty 1

## 2016-09-17 NOTE — ED Provider Notes (Signed)
[place] DEPT Provider Note   CSN: [id number] Arrival date & time: 09/17/16  2230     History   Chief Complaint Chief Complaint  Patient presents with  . Nausea    HPI [name] is a 61 y.o. female.  Patient is a 61 year old female with past medical history of liver transplant, end-stage renal disease on hemodialysis, CAD, CHF presenting for evaluation of nausea. She had her dialysis session earlier today and since has been unable to keep anything down. She reports multiple episodes of vomiting. How has been nonbloody. She denies any fevers, chills, or diarrhea. She denies any ill contacts. She was told by her doctor to "double up" on her Zofran, however this has not helped either.   The history is provided by the patient.  Emesis   This is a new problem. The current episode started yesterday. The problem occurs continuously. The problem has not changed since onset.The emesis has an appearance of stomach contents. There has been no fever. Pertinent negatives include no abdominal pain, no chills, no diarrhea and no fever.    Past Medical History:  Diagnosis Date  . Anemia    takes Folic Acid daily  . Anxiety   . Aortic stenosis   . Arthritis    "left hand, back" (08/30/2012)  . Asthma   . CAD (coronary artery disease)   . CAD (coronary artery disease) Jan. 2015   Cath: 20% LAD, 50% D1; s/p LIMA-LAD  . Chest pain 03/06/2015  . CHF (congestive heart failure) ([place]) 05/2016  . Chronic back pain   . Chronic bronchitis ([place])    "q yr w/season changes" (08/30/2012)  . Chronic constipation    takes MIralax and Colace daily  . COPD (chronic obstructive pulmonary disease) ([place])   . Depression    takes Cymbalta for "severe" depression  . End stage renal disease on dialysis [facility]) 02/27/2011   Kidneys shut down at time of liver transplant in Sept 2011 at [facility] in [place], she has been on HD ever since.  Dialyzes at [facility] on TTS schedule.  Had L forearm graft used 10  months then removed Dec 2012 due to suspected infection.  A right upper arm AVG was placed Dec 2012 but she developed steal symptoms acutely and it was ligated the same day.  Never had an AV fistula due to small veins.  Now has L thigh AVG put in Jan 2013, has not clotted to date.    [name] ESRD on dialysis [facility])    "MWF; [facility]" (08/12/2016)  . GERD (gastroesophageal reflux disease)    takes Omeprazole daily  . Headache    "at least monthly" (08/30/2012)  . Hepatitis C   . History of blood transfusion    "several" (08/30/2012)  . Hypertension    takes Metoprolol and Lisinopril daily, sees Dr Bea Graff  . Hypothyroidism    takes Synthroid daily  . Migraine    "last migraine was in 2013" (08/30/2012)  . Neuromuscular disorder ([place])    carpal tunnel in right hand  . Obesity   . Peripheral vascular disease (HCC) hands and legs  . Pneumonia    "today and several times before" (08/30/2012)  . 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 S/P unilateral BKA (below knee amputation), left ([place]) 04/30/2016  . SVT (  supraventricular tachycardia) ([place]) 06/09/14  . Tobacco abuse   . Type II diabetes mellitus (HCC)    Levemir 2units daily if > 150    Patient Active Problem List   Diagnosis Date Noted  . ESRD (end stage renal disease) ([place]) 08/28/2016  . ESRF (end stage renal failure) ([place]) 08/27/2016  . Protein-calorie malnutrition, severe 08/13/2016  . Acute CHF (congestive heart failure) ([place]) 08/09/2016  . Fluid overload 07/31/2016  . SOB (shortness of breath)   . Type 2 diabetes mellitus with peripheral neuropathy (HCC)   . H/O mitral valve replacement with mechanical valve   . Amputation of right lower extremity below knee ([place]) 07/10/2016  . History of liver transplant ([place])   . Post-operative pain   . Unilateral complete BKA, right, subsequent encounter ([place])   .  S/P bilateral BKA (below knee amputation) ([place])   . Malnutrition of moderate degree 07/01/2016  . Cellulitis 06/30/2016  . PAD (peripheral artery disease) ([place]) 06/30/2016  . Diabetic foot infection ([place]) 06/30/2016  . Pressure injury of skin 05/28/2016  . Volume overload 05/28/2016  . Acute exacerbation of CHF (congestive heart failure) ([place]) 05/27/2016  . Extremity atherosclerosis with resting pain ([place])   . H/O heart valve replacement with mechanical valve   . Phantom limb pain ([place])   . Subtherapeutic international normalized ratio (INR)   . Supratherapeutic INR   . Anemia of chronic disease   . Chronic midline low back pain without sciatica   . ESRD on dialysis ([place])   . Type 2 diabetes mellitus with complication, with long-term current use of insulin ([place])   . S/P unilateral BKA (below knee amputation), left ([place]) 04/30/2016  . Unilateral complete BKA, left, initial encounter ([place]) 04/30/2016  . Abnormality of gait   . Coronary artery disease involving coronary bypass graft of native heart without angina pectoris   . Benign essential HTN   . History of migraine   . Leukocytosis   . Critical lower limb ischemia 04/22/2016  . Foot pain 04/21/2016  . Fever   . Advance care planning   . Goals of care, counseling/discussion   . Palliative care by specialist   . Sepsis ([place])   . Hypotension   . Claudication of left lower extremity ([place])   . Elevated troponin 03/05/2016  . Pressure injury of skin, stage 2 11/27/2015  . H/O unilateral nephrectomy 07/06/2015  . Paroxysmal SVT (supraventricular tachycardia) ([place]) 06/22/2014  . Complications due to renal dialysis device, implant, and graft 05/11/2014  . Acute blood loss anemia 03/31/2014  . Renal dialysis device, implant, or graft complication 10/62/6948  . Acute hyperkalemia   . S/P liver transplant ([place])   . Chronic hepatitis C without hepatic coma ([place])   . Pulmonary edema 01/03/2014  . Respiratory failure ([place]) 01/03/2014    . Long term current use of anticoagulant therapy 04/22/2013  . Chronic anticoagulation w/ coumadin, goal INR 2.5-3.0 w/ AVR 04/14/2013  . Tobacco abuse 02/28/2013  . COPD (chronic obstructive pulmonary disease) ([place]) 02/21/2013  . Chronic combined systolic and diastolic congestive heart failure ([place]) 02/06/2013  . Acute on chronic respiratory failure with hypoxia ([place]) 11/02/2012  . Acute pulmonary edema ([place]) 11/02/2012  . Acute on chronic systolic CHF (congestive heart failure) ([place]) 11/02/2012  . Dyslipidemia-LDL 104, not on statin with Hx of liver transplant 07/30/2012  . CAD (coronary artery disease), native coronary artery 07/27/2012  . History of prosthetic aortic valve replacement   . End stage renal disease on dialysis [facility])  02/27/2011  . Hypothyroidism 06/25/2006  . Type 2 diabetes mellitus with chronic kidney disease ([place]) 06/25/2006  . GERD 06/25/2006  . Anemia due to chronic renal failure    . Hypertensive heart disease     Past Surgical History:  Procedure Laterality Date  . ABDOMINAL AORTOGRAM W/LOWER EXTREMITY N/A 07/04/2016   Procedure: Abdominal Aortogram w/Lower Extremity;  Surgeon: Elam Dutch, MD;  Location: [place] CV LAB;  Service: Cardiovascular;  Laterality: N/A;  . AMPUTATION Left 04/24/2016   Procedure: AMPUTATION BELOW KNEE;  Surgeon: Angelia Mould, MD;  Location: [place];  Service: Vascular;  Laterality: Left;  . AMPUTATION Right 07/08/2016   Procedure: AMPUTATION BELOW KNEE;  Surgeon: Angelia Mould, MD;  Location: [place];  Service: Vascular;  Laterality: Right;  . AORTIC VALVE REPLACEMENT N/A 03/15/2013   AVR; Surgeon: Ivin Poot, MD;  Location: [facility] OR; Open Heart Surgery;  47mm[employer] mechanical prosthesis, top hat valve  . ARTERIOVENOUS GRAFT PLACEMENT Left 10/03/10    forearm  . AV FISTULA PLACEMENT  01/29/2011   Procedure: INSERTION OF ARTERIOVENOUS (AV) GORE-TEX GRAFT ARM;  Surgeon: Elam Dutch, MD;  Location: [facility] OR;   Service: Vascular;  Laterality: Right;  . AV FISTULA PLACEMENT  03/10/2011   Procedure: INSERTION OF ARTERIOVENOUS (AV) GORE-TEX GRAFT THIGH;  Surgeon: Elam Dutch, MD;  Location: [place];  Service: Vascular;  Laterality: Left;  . AV FISTULA PLACEMENT Left 07/21/2016   Procedure: CREATION OF LEFT UPPER ARM ARTERIOVENOUS (AV) FISTULA;  Surgeon: Conrad Lafayette, MD;  Location: [place];  Service: Vascular;  Laterality: Left;  . [place] REMOVAL  12/23/2010   Procedure: REMOVAL OF ARTERIOVENOUS GORETEX GRAFT ([place]);  Surgeon: Elam Dutch, MD;  Location: [place];  Service: Vascular;  Laterality: Left;  procedure started @1736 -1852  . CHOLECYSTECTOMY  1993  . CORONARY ANGIOGRAPHY N/A 08/12/2016   Procedure: CORONARY ANGIOGRAPHY (CATH LAB);  Surgeon: Leonie Man, MD;  Location: [place] CV LAB;  Service: Cardiovascular;  Laterality: N/A;  . CORONARY ARTERY BYPASS GRAFT N/A 03/15/2013   Procedure: CORONARY ARTERY BYPASS GRAFTING (CABG) times one using left internal mammary artery.;  Surgeon: Ivin Poot, MD;  Location: [place];  Service: Open Heart Surgery;  Laterality: N/A;  POSS CABG X 1  . CYSTOSCOPY  1990's  . INSERTION OF DIALYSIS CATHETER  12/23/2010   Procedure: INSERTION OF DIALYSIS CATHETER;  Surgeon: Elam Dutch, MD;  Location: [place];  Service: Vascular;  Laterality: Right;  Right Internal Jugular 28cm dialysis catheter insertion procedure time 1701-1720   . INSERTION OF DIALYSIS CATHETER Right 03/11/2016   Procedure: INSERTION OF DIALYSIS CATHETER;  Surgeon: Angelia Mould, MD;  Location: [place];  Service: Vascular;  Laterality: Right;  . INTRAOPERATIVE TRANSESOPHAGEAL ECHOCARDIOGRAM N/A 03/15/2013   Procedure: INTRAOPERATIVE TRANSESOPHAGEAL ECHOCARDIOGRAM;  Surgeon: Ivin Poot, MD;  Location: [place];  Service: Open Heart Surgery;  Laterality: N/A;  . LEFT HEART CATHETERIZATION WITH CORONARY ANGIOGRAM N/A 07/29/2012   Procedure: LEFT HEART CATHETERIZATION WITH CORONARY ANGIOGRAM;   Surgeon: Troy Sine, MD;  Location[facility] CATH LAB;  Service: Cardiovascular;  Laterality: N/A;  . LEFT HEART CATHETERIZATION WITH CORONARY ANGIOGRAM N/A 03/10/2013   Procedure: LEFT HEART CATHETERIZATION WITH CORONARY ANGIOGRAM;  Surgeon: Troy Sine, MD;  Location: [facility] CATH LAB;  Service: Cardiovascular;  Laterality: N/A;  . LEFT HEART CATHETERIZATION WITH CORONARY/GRAFT ANGIOGRAM N/A 12/24/2011   Procedure: LEFT HEART CATHETERIZATION WITH Beatrix Fetters;  Surgeon: [name], MD;  Location: [place] CATH LAB;  Service: Cardiovascular;  Laterality: N/A;  . LEFT HEART CATHETERIZATION WITH CORONARY/GRAFT ANGIOGRAM N/A 12/16/2013   Procedure: LEFT HEART CATHETERIZATION WITH Beatrix Fetters;  Surgeon: Troy Sine, MD;  Location: [facility] C[facility] LAB;  Service: Cardiovascular;  Laterality: N/A;  . LIGATION ARTERIOVENOUS GORTEX GRAFT Left 03/11/2016   Procedure: LIGATION THIGH ARTERIOVENOUS GORTEX GRAFT;  Surgeon: Angelia Mould, MD;  Location: [place];  Service: Vascular;  Laterality: Left;  . LIGATION OF ARTERIOVENOUS  FISTULA Left 08/11/2016   Procedure: LIGATION OF ARTERIOVENOUS  FISTULA;  Surgeon: Elam Dutch, MD;  Location: [place];  Service: Vascular;  Laterality: Left;  . LIVER TRANSPLANT  10/25/2009   sees Dr Ferol Luz 1 every 6 months, saw last in Dec 2013. [name] Coord [phone]  . PERIPHERAL VASCULAR CATHETERIZATION N/A 11/06/2015   Procedure: Abdominal Aortogram;  Surgeon: Serafina Mitchell, MD;  Location: [place] CV LAB;  Service: Cardiovascular;  Laterality: N/A;  . PERIPHERAL VASCULAR CATHETERIZATION N/A 11/06/2015   Procedure: Lower Extremity Angiography;  Surgeon: Serafina Mitchell, MD;  Location: [place] CV LAB;  Service: Cardiovascular;  Laterality: N/A;  . PERIPHERAL VASCULAR CATHETERIZATION  11/06/2015   Procedure: Peripheral Vascular Atherectomy;  Surgeon: Serafina Mitchell, MD;  Location: [place] CV LAB;  Service: Cardiovascular;;  Left  Superficial femoral  . SHUNTOGRAM Left 05/15/2014   Procedure: SHUNTOGRAM;  Surgeon: Conrad [place], MD;  Location: [facility] CATH LAB;  Service: Cardiovascular;  Laterality: Left;  . SMALL INTESTINE SURGERY  90's  . SPINAL GROWTH RODS  2010   "put 2 metal rods in my back; they had detetriorated" (08/30/2012)  . THROMBECTOMY    . THROMBECTOMY AND REVISION OF ARTERIOVENTOUS (AV) GORETEX  GRAFT Left 03/30/2014  . THROMBECTOMY AND REVISION OF ARTERIOVENTOUS (AV) GORETEX  GRAFT Left 03/30/2014   Procedure: THROMBECTOMY AND REVISION OF ARTERIOVENTOUS (AV) GORETEX  GRAFT;  Surgeon: Conrad [place], MD;  Location: [place];  Service: Vascular;  Laterality: Left;  . TUBAL LIGATION  1990's    OB History    No data available       Home Medications    Prior to Admission medications   Medication Sig Start Date End Date Taking? Authorizing Provider  ALPRAZolam (XANAX) 0.25 MG tablet Take 0.25 mg by mouth 3 (three) times daily as needed for anxiety. 08/26/16   [provider]  Amino Acids-Protein Hydrolys (FEEDING SUPPLEMENT, PRO-STAT SUGAR FREE 64,) LIQD Take 30 mLs by mouth 2 (two) times daily. 08/28/16   Lavina Hamman, MD  aspirin EC 81 MG tablet Take 81 mg by mouth daily at 12 noon.     [provider]  calcitRIOL (ROCALTROL) 0.5 MCG capsule Take 1 capsule (0.5 mcg total) by mouth every Monday, Wednesday, and Friday with hemodialysis. 07/25/16   Angiulli, Lavon Paganini, PA-C  calcium acetate (PHOSLO) 667 MG capsule Take 1 capsule (667 mg total) by mouth 3 (three) times daily with meals. 07/24/16   Angiulli, Lavon Paganini, PA-C  carvedilol (COREG) 3.125 MG tablet Take 1 tablet (3.125 mg total) by mouth 2 (two) times daily with a meal. 08/13/16   Rai, Ripudeep K, MD  enoxaparin (LOVENOX) 40 MG/0.4ML injection Inject 0.4 mLs (40 mg total) into the skin daily. 08/29/16 08/29/17  Lavina Hamman, MD  feeding supplement (BOOST / RESOURCE BREEZE) LIQD Take 1 Container by mouth 3 (three) times daily between meals. 04/25/16    Reyne Dumas, MD  levothyroxine (SYNTHROID, LEVOTHROID) 175 MCG tablet Take 1  tablet (175 mcg total) by mouth daily at 12 noon. 07/24/16   Angiulli, Lavon Paganini, PA-C  methocarbamol (ROBAXIN) 500 MG tablet Take 0.5 tablets (250 mg total) by mouth every 6 (six) hours as needed for muscle spasms. 07/24/16   Angiulli, Lavon Paganini, PA-C  metoCLOPramide (REGLAN) 5 MG tablet Take 1 tablet (5 mg total) by mouth 2 (two) times daily before a meal. Decrease to once a day before breakfast after a couple of weeks. 07/24/16   Angiulli, Lavon Paganini, PA-C  mirtazapine (REMERON) 15 MG tablet Take 15 mg by mouth daily. 08/17/16   [provider]  multivitamin (RENA-VIT) TABS tablet Take 1 tablet by mouth at bedtime. Patient not taking: Reported on 08/27/2016 05/20/16   Love, Ivan Anchors, PA-C  nitroGLYCERIN (NITROSTAT) 0.4 MG SL tablet Place 1 tablet (0.4 mg total) under the tongue every 5 (five) minutes as needed for chest pain. Patient not taking: Reported on 08/27/2016 03/20/14   Elgergawy, Silver Huguenin, MD  ondansetron (ZOFRAN-ODT) 4 MG disintegrating tablet Take 1 tablet (4 mg total) by mouth every 8 (eight) hours as needed for nausea or vomiting. 08/13/16   Rai, Ripudeep K, MD  tacrolimus (PROGRAF) 1 MG capsule Take 2 mg by mouth 2 (two) times daily.    [provider]  warfarin (COUMADIN) 2 MG tablet Take 1-2 tablets by mouth daily as directed by coumadin clinic 09/12/16   Troy Sine, MD    Family History Family History  Problem Relation Age of Onset  . Cancer Mother   . Diabetes Mother   . Hypertension Mother   . Stroke Mother   . Cancer Father   . Anesthesia problems Neg Hx   . Hypotension Neg Hx   . Malignant hyperthermia Neg Hx   . Pseudochol deficiency Neg Hx     Social History Social History  Substance Use Topics  . Smoking status: Current Every Day Smoker    Packs/day: 0.33    Years: 43.00    Types: Cigarettes  . Smokeless tobacco: Never Used  . Alcohol use No     Allergies    Acetaminophen; Codeine; and Mirtazapine   Review of Systems Review of Systems  Constitutional: Negative for chills and fever.  Gastrointestinal: Positive for vomiting. Negative for abdominal pain and diarrhea.  All other systems reviewed and are negative.    Physical Exam Updated Vital Signs BP 135/75 (BP Location: Right Arm)   Pulse 84   Temp 97.8 F (36.6 C) (Oral)   Resp 15   Wt 43.8 kg (96 lb 8 oz)   SpO2 94%   BMI 16.56 kg/m   Physical Exam  Constitutional: She is oriented to person, place, and time. No distress.  Patient is a chronically ill-appearing patient. She appears uncomfortable.  HENT:  Head: Normocephalic and atraumatic.  Mouth/Throat: Oropharynx is clear and moist.  Neck: Normal range of motion. Neck supple.  Cardiovascular: Normal rate and regular rhythm.  Exam reveals no gallop and no friction rub.   No murmur heard. Pulmonary/Chest: Effort normal and breath sounds normal. No respiratory distress. She has no wheezes.  Abdominal: Soft. Bowel sounds are normal. She exhibits no distension. There is no tenderness.  Musculoskeletal: Normal range of motion.  She is status post bilateral BKA.  Neurological: She is alert and oriented to person, place, and time.  Skin: Skin is warm and dry. She is not diaphoretic.  Nursing note and vitals reviewed.    ED Treatments / Results  Labs (all labs  ordered are listed, but only abnormal results are displayed) Labs Reviewed  COMPREHENSIVE METABOLIC PANEL  CBC WITH DIFFERENTIAL/PLATELET  LIPASE, BLOOD    EKG  EKG Interpretation None       Radiology No results found.  Procedures Procedures (including critical care time)  Medications Ordered in ED Medications  morphine 4 MG/ML injection 4 mg (not administered)  promethazine (PHENERGAN) injection 12.5 mg (not administered)     Initial Impression / Assessment and Plan / ED Course  I have reviewed the triage vital signs and the nursing  notes.  Pertinent labs & imaging results that were available during my care of the patient were reviewed by me and considered in my medical decision making (see chart for details).  Laboratory studies are reassuring and the patient is feeling better after medications given in the ED. I see no indication for further workup. Patient may have had some sort of adverse reaction to dialysis or possibly some GI issue, however her abdomen is benign and I see no indication for further treatment. She will be discharged, to return as needed for any problems.  Final Clinical Impressions(s) / ED Diagnoses   Final diagnoses:  None    New Prescriptions New Prescriptions   No medications on file     Veryl Speak, MD 09/18/16 [phone]

## 2016-09-17 NOTE — ED Triage Notes (Signed)
Per [facility] pt is from home and had c/o N/V and weakness for 24hrs. Pt was dialyzed today with 2L removed and had N/V during tx. Pt was instructed to "go home and take double dose of zofran." Pt reports medications have not helped.

## 2016-09-17 NOTE — Progress Notes (Deleted)
    Postoperative Access Visit   History of Present Illness   [name] is a 61 y.o. year old female who presents for postoperative follow-up for: left single stage brachial vein transposition (Date: 07/21/16).  The patient's wounds are *** healed.  The patient notes *** steal symptoms.  The patient is *** able to complete their activities of daily living.  The patient's current symptoms are: ***.   Physical Examination   ***There were no vitals filed for this visit.  LUE: Incision is *** healed, skin feels ***, hand grip is ***/5, sensation in digits is *** intact, ***palpable thrill, bruit can *** be auscultated    Medical Decision Making   [name] is a 61 y.o. year old female who presents s/p left first stage brachial vein transposition   The patient's access is *** ready for use.  ***The patient's tunneled dialysis catheter can be removed after two successful cannulations and completed dialysis treatments.  Thank you for allowing [place] to participate in this patient's care.   Adele Barthel, MD, FACS [facility] Office: [phone] Pager: [phone]

## 2016-09-18 LAB — CBC WITH DIFFERENTIAL/PLATELET
BASOS ABS: 0 10*3/uL (ref 0.0–0.1)
Basophils Relative: 0 %
Eosinophils Absolute: 0.4 10*3/uL (ref 0.0–0.7)
Eosinophils Relative: 4 %
HEMATOCRIT: 36.1 % (ref 36.0–46.0)
Hemoglobin: 12.7 g/dL (ref 12.0–15.0)
LYMPHS ABS: 2.1 10*3/uL (ref 0.7–4.0)
LYMPHS PCT: 26 %
MCH: 30.7 pg (ref 26.0–34.0)
MCHC: 35.2 g/dL (ref 30.0–36.0)
MCV: 87.2 fL (ref 78.0–100.0)
Monocytes Absolute: 0.8 10*3/uL (ref 0.1–1.0)
Monocytes Relative: 10 %
NEUTROS ABS: 4.9 10*3/uL (ref 1.7–7.7)
Neutrophils Relative %: 60 %
Platelets: 149 10*3/uL — ABNORMAL LOW (ref 150–400)
RBC: 4.14 MIL/uL (ref 3.87–5.11)
RDW: 17.3 % — ABNORMAL HIGH (ref 11.5–15.5)
WBC: 8.2 10*3/uL (ref 4.0–10.5)

## 2016-09-18 LAB — COMPREHENSIVE METABOLIC PANEL
ALT: 22 U/L (ref 14–54)
AST: 65 U/L — ABNORMAL HIGH (ref 15–41)
Albumin: 2.4 g/dL — ABNORMAL LOW (ref 3.5–5.0)
Alkaline Phosphatase: 191 U/L — ABNORMAL HIGH (ref 38–126)
Anion gap: 13 (ref 5–15)
BUN: 17 mg/dL (ref 6–20)
CO2: 26 mmol/L (ref 22–32)
Calcium: 9.1 mg/dL (ref 8.9–10.3)
Chloride: 97 mmol/L — ABNORMAL LOW (ref 101–111)
Creatinine, Ser: 3.62 mg/dL — ABNORMAL HIGH (ref 0.44–1.00)
GFR calc Af Amer: 14 mL/min — ABNORMAL LOW (ref >60)
GFR calc non Af Amer: 12 mL/min — ABNORMAL LOW (ref >60)
Glucose, Bld: 124 mg/dL — ABNORMAL HIGH (ref 65–99)
Potassium: 4.8 mmol/L (ref 3.5–5.1)
Sodium: 136 mmol/L (ref 135–145)
Total Bilirubin: 1.3 mg/dL — ABNORMAL HIGH (ref 0.3–1.2)
Total Protein: 7.8 g/dL (ref 6.5–8.1)

## 2016-09-18 LAB — LIPASE, BLOOD: Lipase: 19 U/L (ref 11–51)

## 2016-09-18 MED ORDER — ONDANSETRON 8 MG PO TBDP: ORAL_TABLET | ORAL | 0 refills | Status: AC

## 2016-09-18 MED ORDER — OXYCODONE HCL 5 MG PO TABS: 5.0000 mg | ORAL_TABLET | ORAL | 0 refills | Status: DC | PRN

## 2016-09-18 NOTE — ED Notes (Signed)
Pt calling her daughter to pick her up

## 2016-09-18 NOTE — Discharge Instructions (Signed)
Zofran as prescribed as needed for nausea.  Oxycodone as prescribed as needed for pain.  Help with your dialysis as scheduled, and return to the emergency department of symptoms significant only worsen or change.

## 2016-09-19 ENCOUNTER — Encounter: Payer: Medicare Other | Admitting: Vascular Surgery

## 2016-09-19 DIAGNOSIS — N186 End stage renal disease: Secondary | ICD-10-CM | POA: Diagnosis not present

## 2016-09-19 DIAGNOSIS — D631 Anemia in chronic kidney disease: Secondary | ICD-10-CM | POA: Diagnosis not present

## 2016-09-19 DIAGNOSIS — N2581 Secondary hyperparathyroidism of renal origin: Secondary | ICD-10-CM | POA: Diagnosis not present

## 2016-09-19 DIAGNOSIS — E118 Type 2 diabetes mellitus with unspecified complications: Secondary | ICD-10-CM | POA: Diagnosis not present

## 2016-09-19 DIAGNOSIS — E876 Hypokalemia: Secondary | ICD-10-CM | POA: Diagnosis not present

## 2016-09-20 DIAGNOSIS — R112 Nausea with vomiting, unspecified: Secondary | ICD-10-CM | POA: Diagnosis not present

## 2016-09-20 DIAGNOSIS — F419 Anxiety disorder, unspecified: Secondary | ICD-10-CM | POA: Diagnosis not present

## 2016-09-20 DIAGNOSIS — J449 Chronic obstructive pulmonary disease, unspecified: Secondary | ICD-10-CM | POA: Diagnosis not present

## 2016-09-20 DIAGNOSIS — M6281 Muscle weakness (generalized): Secondary | ICD-10-CM | POA: Diagnosis not present

## 2016-09-20 DIAGNOSIS — Z7901 Long term (current) use of anticoagulants: Secondary | ICD-10-CM | POA: Diagnosis not present

## 2016-09-20 DIAGNOSIS — I509 Heart failure, unspecified: Secondary | ICD-10-CM | POA: Diagnosis not present

## 2016-09-22 DIAGNOSIS — N2581 Secondary hyperparathyroidism of renal origin: Secondary | ICD-10-CM | POA: Diagnosis not present

## 2016-09-22 DIAGNOSIS — E876 Hypokalemia: Secondary | ICD-10-CM | POA: Diagnosis not present

## 2016-09-22 DIAGNOSIS — E118 Type 2 diabetes mellitus with unspecified complications: Secondary | ICD-10-CM | POA: Diagnosis not present

## 2016-09-22 DIAGNOSIS — Z7901 Long term (current) use of anticoagulants: Secondary | ICD-10-CM | POA: Diagnosis not present

## 2016-09-22 DIAGNOSIS — D631 Anemia in chronic kidney disease: Secondary | ICD-10-CM | POA: Diagnosis not present

## 2016-09-22 DIAGNOSIS — N186 End stage renal disease: Secondary | ICD-10-CM | POA: Diagnosis not present

## 2016-09-22 DIAGNOSIS — I359 Nonrheumatic aortic valve disorder, unspecified: Secondary | ICD-10-CM | POA: Diagnosis not present

## 2016-09-22 LAB — PROTIME-INR: INR: 5.2 — AB (ref 0.9–1.1)

## 2016-09-23 ENCOUNTER — Encounter: Payer: Self-pay | Admitting: Vascular Surgery

## 2016-09-23 ENCOUNTER — Ambulatory Visit (INDEPENDENT_AMBULATORY_CARE_PROVIDER_SITE_OTHER): Payer: Medicare Other | Admitting: Pharmacist Clinician (PhC)/ Clinical Pharmacy Specialist

## 2016-09-23 DIAGNOSIS — Z7901 Long term (current) use of anticoagulants: Secondary | ICD-10-CM

## 2016-09-24 DIAGNOSIS — E876 Hypokalemia: Secondary | ICD-10-CM | POA: Diagnosis not present

## 2016-09-24 DIAGNOSIS — N186 End stage renal disease: Secondary | ICD-10-CM | POA: Diagnosis not present

## 2016-09-24 DIAGNOSIS — D631 Anemia in chronic kidney disease: Secondary | ICD-10-CM | POA: Diagnosis not present

## 2016-09-24 DIAGNOSIS — N2581 Secondary hyperparathyroidism of renal origin: Secondary | ICD-10-CM | POA: Diagnosis not present

## 2016-09-24 DIAGNOSIS — E118 Type 2 diabetes mellitus with unspecified complications: Secondary | ICD-10-CM | POA: Diagnosis not present

## 2016-09-26 DIAGNOSIS — E876 Hypokalemia: Secondary | ICD-10-CM | POA: Diagnosis not present

## 2016-09-26 DIAGNOSIS — E118 Type 2 diabetes mellitus with unspecified complications: Secondary | ICD-10-CM | POA: Diagnosis not present

## 2016-09-26 DIAGNOSIS — N186 End stage renal disease: Secondary | ICD-10-CM | POA: Diagnosis not present

## 2016-09-26 DIAGNOSIS — D631 Anemia in chronic kidney disease: Secondary | ICD-10-CM | POA: Diagnosis not present

## 2016-09-26 DIAGNOSIS — N2581 Secondary hyperparathyroidism of renal origin: Secondary | ICD-10-CM | POA: Diagnosis not present

## 2016-09-26 NOTE — Progress Notes (Deleted)
    Postoperative Access Visit   History of Present Illness   [name] is a 61 y.o. year old female who presents for postoperative follow-up for: left first stage brachial vein transposition (Date: 07/21/16).  The patient's wounds are *** healed.  The patient notes *** steal symptoms.  The patient is *** able to complete their activities of daily living.  The patient's current symptoms are: ***.   Physical Examination   ***There were no vitals filed for this visit.  LUE: Incision is *** healed, skin feels ***, hand grip is ***/5, sensation in digits is *** intact, ***palpable thrill, bruit can *** be auscultated    Medical Decision Making   [name] is a 61 y.o. year old female who presents s/p left first stage brachial vein transposition   The patient's access is *** ready for use.  ***The patient's tunneled dialysis catheter can be removed after two successful cannulations and completed dialysis treatments.  Thank you for allowing [place] to participate in this patient's care.   Adele Barthel, MD, FACS [facility] [place] Office: [phone] Pager: [phone]

## 2016-09-28 ENCOUNTER — Inpatient Hospital Stay (HOSPITAL_COMMUNITY)
Admission: AD | Admit: 2016-09-28 | Discharge: 2016-10-02 | DRG: 280 | Disposition: A | Discharge status: Home Health | Payer: Medicare Other | Source: Other Acute Inpatient Hospital | Attending: Internal Medicine | Admitting: Internal Medicine

## 2016-09-28 ENCOUNTER — Encounter (HOSPITAL_COMMUNITY): Payer: Self-pay | Admitting: Internal Medicine

## 2016-09-28 DIAGNOSIS — F419 Anxiety disorder, unspecified: Secondary | ICD-10-CM | POA: Diagnosis present

## 2016-09-28 DIAGNOSIS — Z823 Family history of stroke: Secondary | ICD-10-CM

## 2016-09-28 DIAGNOSIS — I10 Essential (primary) hypertension: Secondary | ICD-10-CM | POA: Diagnosis not present

## 2016-09-28 DIAGNOSIS — Z951 Presence of aortocoronary bypass graft: Secondary | ICD-10-CM

## 2016-09-28 DIAGNOSIS — I5042 Chronic combined systolic (congestive) and diastolic (congestive) heart failure: Secondary | ICD-10-CM | POA: Diagnosis present

## 2016-09-28 DIAGNOSIS — Z992 Dependence on renal dialysis: Secondary | ICD-10-CM | POA: Diagnosis not present

## 2016-09-28 DIAGNOSIS — F329 Major depressive disorder, single episode, unspecified: Secondary | ICD-10-CM | POA: Diagnosis present

## 2016-09-28 DIAGNOSIS — R42 Dizziness and giddiness: Secondary | ICD-10-CM | POA: Diagnosis not present

## 2016-09-28 DIAGNOSIS — Z7982 Long term (current) use of aspirin: Secondary | ICD-10-CM

## 2016-09-28 DIAGNOSIS — D638 Anemia in other chronic diseases classified elsewhere: Secondary | ICD-10-CM | POA: Diagnosis present

## 2016-09-28 DIAGNOSIS — N2581 Secondary hyperparathyroidism of renal origin: Secondary | ICD-10-CM | POA: Diagnosis present

## 2016-09-28 DIAGNOSIS — M479 Spondylosis, unspecified: Secondary | ICD-10-CM | POA: Diagnosis present

## 2016-09-28 DIAGNOSIS — Z7901 Long term (current) use of anticoagulants: Secondary | ICD-10-CM

## 2016-09-28 DIAGNOSIS — N189 Chronic kidney disease, unspecified: Secondary | ICD-10-CM | POA: Diagnosis not present

## 2016-09-28 DIAGNOSIS — K219 Gastro-esophageal reflux disease without esophagitis: Secondary | ICD-10-CM | POA: Diagnosis present

## 2016-09-28 DIAGNOSIS — R55 Syncope and collapse: Secondary | ICD-10-CM | POA: Diagnosis not present

## 2016-09-28 DIAGNOSIS — R778 Other specified abnormalities of plasma proteins: Secondary | ICD-10-CM | POA: Diagnosis present

## 2016-09-28 DIAGNOSIS — I2510 Atherosclerotic heart disease of native coronary artery without angina pectoris: Secondary | ICD-10-CM | POA: Diagnosis present

## 2016-09-28 DIAGNOSIS — Z794 Long term (current) use of insulin: Secondary | ICD-10-CM

## 2016-09-28 DIAGNOSIS — I959 Hypotension, unspecified: Secondary | ICD-10-CM | POA: Diagnosis not present

## 2016-09-28 DIAGNOSIS — I252 Old myocardial infarction: Secondary | ICD-10-CM

## 2016-09-28 DIAGNOSIS — R627 Adult failure to thrive: Secondary | ICD-10-CM | POA: Diagnosis present

## 2016-09-28 DIAGNOSIS — R531 Weakness: Secondary | ICD-10-CM | POA: Diagnosis not present

## 2016-09-28 DIAGNOSIS — I21A1 Myocardial infarction type 2: Secondary | ICD-10-CM | POA: Diagnosis not present

## 2016-09-28 DIAGNOSIS — E1122 Type 2 diabetes mellitus with diabetic chronic kidney disease: Secondary | ICD-10-CM | POA: Diagnosis not present

## 2016-09-28 DIAGNOSIS — Z833 Family history of diabetes mellitus: Secondary | ICD-10-CM

## 2016-09-28 DIAGNOSIS — Z66 Do not resuscitate: Secondary | ICD-10-CM | POA: Diagnosis present

## 2016-09-28 DIAGNOSIS — R0602 Shortness of breath: Secondary | ICD-10-CM | POA: Diagnosis not present

## 2016-09-28 DIAGNOSIS — Z888 Allergy status to other drugs, medicaments and biological substances status: Secondary | ICD-10-CM

## 2016-09-28 DIAGNOSIS — Z89512 Acquired absence of left leg below knee: Secondary | ICD-10-CM

## 2016-09-28 DIAGNOSIS — Z885 Allergy status to narcotic agent status: Secondary | ICD-10-CM

## 2016-09-28 DIAGNOSIS — Z452 Encounter for adjustment and management of vascular access device: Secondary | ICD-10-CM | POA: Diagnosis not present

## 2016-09-28 DIAGNOSIS — M19042 Primary osteoarthritis, left hand: Secondary | ICD-10-CM | POA: Diagnosis present

## 2016-09-28 DIAGNOSIS — I132 Hypertensive heart and chronic kidney disease with heart failure and with stage 5 chronic kidney disease, or end stage renal disease: Secondary | ICD-10-CM | POA: Diagnosis present

## 2016-09-28 DIAGNOSIS — G8929 Other chronic pain: Secondary | ICD-10-CM | POA: Diagnosis present

## 2016-09-28 DIAGNOSIS — K5909 Other constipation: Secondary | ICD-10-CM | POA: Diagnosis present

## 2016-09-28 DIAGNOSIS — E1151 Type 2 diabetes mellitus with diabetic peripheral angiopathy without gangrene: Secondary | ICD-10-CM | POA: Diagnosis present

## 2016-09-28 DIAGNOSIS — D631 Anemia in chronic kidney disease: Secondary | ICD-10-CM | POA: Diagnosis not present

## 2016-09-28 DIAGNOSIS — I4891 Unspecified atrial fibrillation: Secondary | ICD-10-CM | POA: Diagnosis present

## 2016-09-28 DIAGNOSIS — I420 Dilated cardiomyopathy: Secondary | ICD-10-CM | POA: Diagnosis present

## 2016-09-28 DIAGNOSIS — N186 End stage renal disease: Secondary | ICD-10-CM | POA: Diagnosis present

## 2016-09-28 DIAGNOSIS — E785 Hyperlipidemia, unspecified: Secondary | ICD-10-CM

## 2016-09-28 DIAGNOSIS — M549 Dorsalgia, unspecified: Secondary | ICD-10-CM | POA: Diagnosis present

## 2016-09-28 DIAGNOSIS — Z952 Presence of prosthetic heart valve: Secondary | ICD-10-CM

## 2016-09-28 DIAGNOSIS — Z7902 Long term (current) use of antithrombotics/antiplatelets: Secondary | ICD-10-CM

## 2016-09-28 DIAGNOSIS — M898X9 Other specified disorders of bone, unspecified site: Secondary | ICD-10-CM | POA: Diagnosis present

## 2016-09-28 DIAGNOSIS — Z809 Family history of malignant neoplasm, unspecified: Secondary | ICD-10-CM

## 2016-09-28 DIAGNOSIS — I342 Nonrheumatic mitral (valve) stenosis: Secondary | ICD-10-CM | POA: Diagnosis not present

## 2016-09-28 DIAGNOSIS — J449 Chronic obstructive pulmonary disease, unspecified: Secondary | ICD-10-CM | POA: Diagnosis present

## 2016-09-28 DIAGNOSIS — E039 Hypothyroidism, unspecified: Secondary | ICD-10-CM | POA: Diagnosis present

## 2016-09-28 DIAGNOSIS — Z681 Body mass index (BMI) 19 or less, adult: Secondary | ICD-10-CM

## 2016-09-28 DIAGNOSIS — Z9049 Acquired absence of other specified parts of digestive tract: Secondary | ICD-10-CM

## 2016-09-28 DIAGNOSIS — Z944 Liver transplant status: Secondary | ICD-10-CM | POA: Diagnosis not present

## 2016-09-28 DIAGNOSIS — G9341 Metabolic encephalopathy: Secondary | ICD-10-CM | POA: Diagnosis present

## 2016-09-28 DIAGNOSIS — R404 Transient alteration of awareness: Secondary | ICD-10-CM | POA: Diagnosis not present

## 2016-09-28 DIAGNOSIS — E440 Moderate protein-calorie malnutrition: Secondary | ICD-10-CM | POA: Diagnosis present

## 2016-09-28 DIAGNOSIS — Z89511 Acquired absence of right leg below knee: Secondary | ICD-10-CM | POA: Diagnosis not present

## 2016-09-28 DIAGNOSIS — I214 Non-ST elevation (NSTEMI) myocardial infarction: Secondary | ICD-10-CM

## 2016-09-28 DIAGNOSIS — F17210 Nicotine dependence, cigarettes, uncomplicated: Secondary | ICD-10-CM | POA: Diagnosis present

## 2016-09-28 DIAGNOSIS — R748 Abnormal levels of other serum enzymes: Secondary | ICD-10-CM | POA: Diagnosis not present

## 2016-09-28 DIAGNOSIS — R34 Anuria and oliguria: Secondary | ICD-10-CM | POA: Diagnosis present

## 2016-09-28 DIAGNOSIS — R4182 Altered mental status, unspecified: Secondary | ICD-10-CM | POA: Diagnosis not present

## 2016-09-28 DIAGNOSIS — R7989 Other specified abnormal findings of blood chemistry: Secondary | ICD-10-CM

## 2016-09-28 DIAGNOSIS — Z8249 Family history of ischemic heart disease and other diseases of the circulatory system: Secondary | ICD-10-CM

## 2016-09-28 DIAGNOSIS — D696 Thrombocytopenia, unspecified: Secondary | ICD-10-CM | POA: Diagnosis present

## 2016-09-28 DIAGNOSIS — G9340 Encephalopathy, unspecified: Secondary | ICD-10-CM | POA: Diagnosis not present

## 2016-09-28 DIAGNOSIS — Z886 Allergy status to analgesic agent status: Secondary | ICD-10-CM

## 2016-09-28 HISTORY — DX: Acquired absence of right leg below knee: Z89.511

## 2016-09-28 HISTORY — DX: Acquired absence of left leg below knee: Z89.512

## 2016-09-28 LAB — PROTIME-INR
INR: 3.48
Prothrombin Time: 35.8 seconds — ABNORMAL HIGH (ref 11.4–15.2)

## 2016-09-28 MED ORDER — METHOCARBAMOL 500 MG PO TABS
250.0000 mg | ORAL_TABLET | Freq: Four times a day (QID) | ORAL | Status: DC | PRN
Start: 2016-09-28 — End: 2016-10-02

## 2016-09-28 MED ORDER — NEPRO/CARBSTEADY PO LIQD
237.0000 mL | Freq: Three times a day (TID) | ORAL | Status: DC | PRN
  Filled 2016-09-28: qty 237

## 2016-09-28 MED ORDER — MIRTAZAPINE 7.5 MG PO TABS
15.0000 mg | ORAL_TABLET | Freq: Every day | ORAL | Status: DC
  Administered 2016-09-29 – 2016-10-02 (×4): 15 mg via ORAL
  Filled 2016-09-28 (×4): qty 2

## 2016-09-28 MED ORDER — SODIUM CHLORIDE 0.9% FLUSH
3.0000 mL | Freq: Two times a day (BID) | INTRAVENOUS | Status: DC
  Administered 2016-09-29 – 2016-10-02 (×7): 3 mL via INTRAVENOUS

## 2016-09-28 MED ORDER — METOCLOPRAMIDE HCL 5 MG PO TABS
5.0000 mg | ORAL_TABLET | Freq: Two times a day (BID) | ORAL | Status: DC
  Administered 2016-09-29 – 2016-10-01 (×5): 5 mg via ORAL
  Filled 2016-09-28 (×7): qty 1

## 2016-09-28 MED ORDER — ONDANSETRON HCL 4 MG PO TABS: 4.0000 mg | ORAL_TABLET | Freq: Four times a day (QID) | ORAL | Status: DC | PRN

## 2016-09-28 MED ORDER — DOCUSATE SODIUM 283 MG RE ENEM
1.0000 | ENEMA | RECTAL | Status: DC | PRN
  Filled 2016-09-28: qty 1

## 2016-09-28 MED ORDER — HYDROXYZINE HCL 25 MG PO TABS: 25.0000 mg | ORAL_TABLET | Freq: Three times a day (TID) | ORAL | Status: DC | PRN

## 2016-09-28 MED ORDER — ONDANSETRON HCL 4 MG/2ML IJ SOLN
4.0000 mg | Freq: Four times a day (QID) | INTRAMUSCULAR | Status: DC | PRN
  Administered 2016-09-29: 4 mg via INTRAVENOUS
  Filled 2016-09-28: qty 2

## 2016-09-28 MED ORDER — ASPIRIN EC 81 MG PO TBEC
81.0000 mg | DELAYED_RELEASE_TABLET | Freq: Every day | ORAL | Status: DC
  Administered 2016-09-29 – 2016-10-02 (×4): 81 mg via ORAL
  Filled 2016-09-28 (×4): qty 1

## 2016-09-28 MED ORDER — TACROLIMUS 1 MG PO CAPS
2.0000 mg | ORAL_CAPSULE | Freq: Two times a day (BID) | ORAL | Status: DC
  Administered 2016-09-29 – 2016-10-02 (×8): 2 mg via ORAL
  Filled 2016-09-28 (×9): qty 2

## 2016-09-28 MED ORDER — RENA-VITE PO TABS
1.0000 | ORAL_TABLET | Freq: Every day | ORAL | Status: DC
  Administered 2016-09-29 – 2016-10-01 (×4): 1 via ORAL
  Filled 2016-09-28 (×4): qty 1

## 2016-09-28 MED ORDER — CALCIUM CARBONATE ANTACID 1250 MG/5ML PO SUSP
500.0000 mg | Freq: Four times a day (QID) | ORAL | Status: DC | PRN
  Filled 2016-09-28: qty 5

## 2016-09-28 MED ORDER — LEVOTHYROXINE SODIUM 75 MCG PO TABS: 175.0000 ug | ORAL_TABLET | Freq: Every day | ORAL | Status: DC

## 2016-09-28 MED ORDER — SORBITOL 70 % SOLN: 30.0000 mL | Status: DC | PRN

## 2016-09-28 MED ORDER — CALCIUM ACETATE (PHOS BINDER) 667 MG PO CAPS
667.0000 mg | ORAL_CAPSULE | Freq: Three times a day (TID) | ORAL | Status: DC
  Administered 2016-09-29: 667 mg via ORAL
  Filled 2016-09-28 (×2): qty 1

## 2016-09-28 MED ORDER — BISACODYL 5 MG PO TBEC: 5.0000 mg | DELAYED_RELEASE_TABLET | Freq: Every day | ORAL | Status: DC | PRN

## 2016-09-28 MED ORDER — CAMPHOR-MENTHOL 0.5-0.5 % EX LOTN
1.0000 "application " | TOPICAL_LOTION | Freq: Three times a day (TID) | CUTANEOUS | Status: DC | PRN
  Filled 2016-09-28: qty 222

## 2016-09-28 MED ORDER — MORPHINE SULFATE (PF) 2 MG/ML IV SOLN: 2.0000 mg | INTRAVENOUS | Status: DC | PRN

## 2016-09-28 MED ORDER — POLYETHYLENE GLYCOL 3350 17 G PO PACK: 17.0000 g | PACK | Freq: Every day | ORAL | Status: DC | PRN

## 2016-09-28 MED ORDER — ALPRAZOLAM 0.25 MG PO TABS
0.2500 mg | ORAL_TABLET | Freq: Three times a day (TID) | ORAL | Status: DC | PRN
  Administered 2016-09-29: 0.25 mg via ORAL
  Filled 2016-09-28: qty 1

## 2016-09-28 MED ORDER — OXYCODONE HCL 5 MG PO TABS
5.0000 mg | ORAL_TABLET | ORAL | Status: DC | PRN
  Administered 2016-09-29 (×2): 5 mg via ORAL
  Filled 2016-09-28 (×2): qty 1

## 2016-09-28 MED ORDER — CALCITRIOL 0.5 MCG PO CAPS: 0.5000 ug | ORAL_CAPSULE | ORAL | Status: DC

## 2016-09-28 MED ORDER — DOCUSATE SODIUM 100 MG PO CAPS
100.0000 mg | ORAL_CAPSULE | Freq: Two times a day (BID) | ORAL | Status: DC
  Administered 2016-09-29 – 2016-10-02 (×8): 100 mg via ORAL
  Filled 2016-09-28 (×8): qty 1

## 2016-09-28 MED ORDER — CARVEDILOL 3.125 MG PO TABS: 3.1250 mg | ORAL_TABLET | Freq: Two times a day (BID) | ORAL | Status: DC

## 2016-09-28 MED ORDER — ZOLPIDEM TARTRATE 5 MG PO TABS: 5.0000 mg | ORAL_TABLET | Freq: Every evening | ORAL | Status: DC | PRN

## 2016-09-28 NOTE — H&P (Addendum)
History and Physical    [name] UKG:[id number] DOB: [birth year] DOA: 09/28/2016  PCP: Benetta Spar Consultants:  [name] - cardiology; Justin Mend - nephrology; [facility] transplant team (liver); Scot Dock - vascular Patient coming from:  Home - lives with daughter; Donald Prose: daughter, [phone]  Chief Complaint: Syncope  HPI: [name] is a 61 y.o. 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  According to her daughter, the patient was fine this AM.  Got herself bathed and dressed but she did seem a little weak.  While at church she was "nodding in and out" - her daughter thought maybe her glucose was dropping but candy didn't help.  They left church early about 1130 and were on the way to the hospital when she passed out and "kind of stopped breathing."  Her daughter called 911 and they sent EMS; they were unable to find a pulse but they didn't start CPR since she has a DNR.  She got to the hospital and the doctors at [facility] were worried about the troponin of 5.  She just started complaining of chest pain when they loaded her in the ambulance to come to [facility] from [place] (although in speaking with the patient, she reports intermittent chest pain over the last week).  Did not c/o SOB.  She was dizzy but not confused.  She has been very lethargic and is not answering her daughter's questions.  +h/o CAD, COPD, CHF, afib with RVR.  The patient was quite somnolent but awoke and answered all the questions I posed to her before drifting back to sleep.  She was admitted:  -7/18-20 for acute on chronic diastolic dysfunction thought to be related to insufficient volume being drawn off at HD, leading to chronic volume overload.  She also had bacteremia and a subtherapeutic INR.  She left AMA. -6/30-7/5  for respiratory failure associated with volume overload.  Echo showed lateral wall akinesis and a marked change in EF.  She underwent cardiac cath which showed probable new disease but the decision was made for medical management. -6/21-23 for respiratory failure associated with volume overload. -5/21-31 (with rehab stay through 6/12) for R BKA (5/29), following L BKA 3 months prior  Review of Systems: As per HPI; otherwise review of systems reviewed and negative.  It is not clear how accurate this is since the patient was very lethargic.  Ambulatory Status:  S/p B AKA, non-ambulatory  Past Medical History:  Diagnosis Date  . Anemia    takes Folic Acid daily  . Anxiety   . Arthritis    "left hand, back" (08/30/2012)  . Asthma   . CAD (coronary artery disease) Jan. 2015   Cath: 20% LAD, 50% D1; s/p LIMA-LAD  . Chest pain 03/06/2015  . CHF (congestive heart failure) ([place]) 05/2016  . Chronic back pain   . Chronic constipation    takes MIralax and Colace daily  . COPD (chronic obstructive pulmonary disease) ([place])   . Depression    takes Cymbalta for "severe" depression  . ESRD on dialysis [facility])    "MWF; [facility]" (08/12/2016)  . GERD (gastroesophageal reflux disease)    takes Omeprazole daily  . Headache    "at least monthly" (08/30/2012)  . Hepatitis C   . History of blood transfusion    "  several" (08/30/2012)  . Hypertension    takes Metoprolol and Lisinopril daily, sees Dr Bea Graff  . Hypothyroidism    takes Synthroid daily  . Migraine    "last migraine was in 2013" (08/30/2012)  . Neuromuscular disorder ([place])    carpal tunnel in right hand  . Obesity   . Peripheral vascular disease (HCC) hands and legs  . Pneumonia    "today and several times before" (08/30/2012)  . 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 S/P bilateral BKA (below knee amputation) ([place]) 04/30/2016  . S/P liver transplant ([place])    2011 at [facility] (cirrhosis due to hep C, got hep C from blood  transfuion in 1980's per pt))  . SVT (supraventricular tachycardia) ([place]) 06/09/14  . Tobacco abuse   . Type II diabetes mellitus (HCC)    Levemir 2units daily if > 150    Past Surgical History:  Procedure Laterality Date  . ABDOMINAL AORTOGRAM W/LOWER EXTREMITY N/A 07/04/2016   Procedure: Abdominal Aortogram w/Lower Extremity;  Surgeon: Elam Dutch, MD;  Location: [place] CV LAB;  Service: Cardiovascular;  Laterality: N/A;  . AMPUTATION Left 04/24/2016   Procedure: AMPUTATION BELOW KNEE;  Surgeon: Angelia Mould, MD;  Location: [place];  Service: Vascular;  Laterality: Left;  . AMPUTATION Right 07/08/2016   Procedure: AMPUTATION BELOW KNEE;  Surgeon: Angelia Mould, MD;  Location: [place];  Service: Vascular;  Laterality: Right;  . AORTIC VALVE REPLACEMENT N/A 03/15/2013   AVR; Surgeon: Ivin Poot, MD;  Location: [facility] OR; Open Heart Surgery;  32mm[employer] mechanical prosthesis, top hat valve  . ARTERIOVENOUS GRAFT PLACEMENT Left 10/03/10    forearm  . AV FISTULA PLACEMENT  01/29/2011   Procedure: INSERTION OF ARTERIOVENOUS (AV) GORE-TEX GRAFT ARM;  Surgeon: Elam Dutch, MD;  Location: [facility] OR;  Service: Vascular;  Laterality: Right;  . AV FISTULA PLACEMENT  03/10/2011   Procedure: INSERTION OF ARTERIOVENOUS (AV) GORE-TEX GRAFT THIGH;  Surgeon: Elam Dutch, MD;  Location: [place];  Service: Vascular;  Laterality: Left;  . AV FISTULA PLACEMENT Left 07/21/2016   Procedure: CREATION OF LEFT UPPER ARM ARTERIOVENOUS (AV) FISTULA;  Surgeon: Conrad Bloomfield, MD;  Location: [place];  Service: Vascular;  Laterality: Left;  . [place] REMOVAL  12/23/2010   Procedure: REMOVAL OF ARTERIOVENOUS GORETEX GRAFT ([place]);  Surgeon: Elam Dutch, MD;  Location: [place];  Service: Vascular;  Laterality: Left;  procedure started @1736 -1852  . CHOLECYSTECTOMY  1993  . CORONARY ANGIOGRAPHY N/A 08/12/2016   Procedure: CORONARY ANGIOGRAPHY (CATH LAB);  Surgeon: Leonie Man, MD;  Location:  [place] CV LAB;  Service: Cardiovascular;  Laterality: N/A;  . CORONARY ARTERY BYPASS GRAFT N/A 03/15/2013   Procedure: CORONARY ARTERY BYPASS GRAFTING (CABG) times one using left internal mammary artery.;  Surgeon: Ivin Poot, MD;  Location: [place];  Service: Open Heart Surgery;  Laterality: N/A;  POSS CABG X 1  . CYSTOSCOPY  1990's  . INSERTION OF DIALYSIS CATHETER  12/23/2010   Procedure: INSERTION OF DIALYSIS CATHETER;  Surgeon: Elam Dutch, MD;  Location: [place];  Service: Vascular;  Laterality: Right;  Right Internal Jugular 28cm dialysis catheter insertion procedure time 1701-1720   . INSERTION OF DIALYSIS CATHETER Right 03/11/2016   Procedure: INSERTION OF DIALYSIS CATHETER;  Surgeon: Angelia Mould, MD;  Location: [place];  Service: Vascular;  Laterality: Right;  . INTRAOPERATIVE TRANSESOPHAGEAL ECHOCARDIOGRAM N/A 03/15/2013   Procedure: INTRAOPERATIVE TRANSESOPHAGEAL ECHOCARDIOGRAM;  Surgeon: Ivin Poot, MD;  Location: [place];  Service: Open Heart Surgery;  Laterality: N/A;  . LEFT HEART CATHETERIZATION WITH CORONARY ANGIOGRAM N/A 07/29/2012   Procedure: LEFT HEART CATHETERIZATION WITH CORONARY ANGIOGRAM;  Surgeon: Troy Sine, MD;  Location: [facility] CATH LAB;  Service: Cardiovascular;  Laterality: N/A;  . LEFT HEART CATHETERIZATION WITH CORONARY ANGIOGRAM N/A 03/10/2013   Procedure: LEFT HEART CATHETERIZATION WITH CORONARY ANGIOGRAM;  Surgeon: Troy Sine, MD;  Location: [facility] CATH LAB;  Service: Cardiovascular;  Laterality: N/A;  . LEFT HEART CATHETERIZATION WITH CORONARY/GRAFT ANGIOGRAM N/A 12/24/2011   Procedure: LEFT HEART CATHETERIZATION WITH Beatrix Fetters;  Surgeon: [name], MD;  Location: [facility] CATH LAB;  Service: Cardiovascular;  Laterality: N/A;  . LEFT HEART CATHETERIZATION WITH CORONARY/GRAFT ANGIOGRAM N/A 12/16/2013   Procedure: LEFT HEART CATHETERIZATION WITH Beatrix Fetters;  Surgeon: Troy Sine, MD;  Location: [facility] CATH LAB;  Service:  Cardiovascular;  Laterality: N/A;  . LIGATION ARTERIOVENOUS GORTEX GRAFT Left 03/11/2016   Procedure: LIGATION THIGH ARTERIOVENOUS GORTEX GRAFT;  Surgeon: Angelia Mould, MD;  Location: [place];  Service: Vascular;  Laterality: Left;  . LIGATION OF ARTERIOVENOUS  FISTULA Left 08/11/2016   Procedure: LIGATION OF ARTERIOVENOUS  FISTULA;  Surgeon: Elam Dutch, MD;  Location: [place];  Service: Vascular;  Laterality: Left;  . LIVER TRANSPLANT  10/25/2009   sees Dr Ferol Luz 1 every 6 months, saw last in Dec 2013. [name] Coord [phone]  . PERIPHERAL VASCULAR CATHETERIZATION N/A 11/06/2015   Procedure: Abdominal Aortogram;  Surgeon: Serafina Mitchell, MD;  Location: [place] CV LAB;  Service: Cardiovascular;  Laterality: N/A;  . PERIPHERAL VASCULAR CATHETERIZATION N/A 11/06/2015   Procedure: Lower Extremity Angiography;  Surgeon: Serafina Mitchell, MD;  Location: [place] CV LAB;  Service: Cardiovascular;  Laterality: N/A;  . PERIPHERAL VASCULAR CATHETERIZATION  11/06/2015   Procedure: Peripheral Vascular Atherectomy;  Surgeon: Serafina Mitchell, MD;  Location: [place] CV LAB;  Service: Cardiovascular;;  Left Superficial femoral  . SHUNTOGRAM Left 05/15/2014   Procedure: SHUNTOGRAM;  Surgeon: Conrad Glouster, MD;  Location: [facility] CATH LAB;  Service: Cardiovascular;  Laterality: Left;  . SMALL INTESTINE SURGERY  90's  . SPINAL GROWTH RODS  2010   "put 2 metal rods in my back; they had detetriorated" (08/30/2012)  . THROMBECTOMY    . THROMBECTOMY AND REVISION OF ARTERIOVENTOUS (AV) GORETEX  GRAFT Left 03/30/2014  . THROMBECTOMY AND REVISION OF ARTERIOVENTOUS (AV) GORETEX  GRAFT Left 03/30/2014   Procedure: THROMBECTOMY AND REVISION OF ARTERIOVENTOUS (AV) GORETEX  GRAFT;  Surgeon: Conrad Chama, MD;  Location: [place];  Service: Vascular;  Laterality: Left;  . TUBAL LIGATION  1990's    Social History   Social History  . Marital status: Divorced    Spouse name: N/A  . Number of children: N/A    . Years of education: N/A   Occupational History  . disabled    Social History Main Topics  . Smoking status: Current Every Day Smoker    Packs/day: 1.50    Years: 43.00    Types: Cigarettes  . Smokeless tobacco: Never Used     Comment: 8/18 - down to <0.5 ppd  . Alcohol use No  . Drug use: No  . Sexual activity: No   Other Topics Concern  . Not on file   Social History Narrative  . No narrative on file    Allergies  Allergen Reactions  . Acetaminophen Other (See Comments)  Liver transplant recipient   . Codeine Itching  . Mirtazapine Other (See Comments)    hallucination    Family History  Problem Relation Age of Onset  . Cancer Mother   . Diabetes Mother   . Hypertension Mother   . Stroke Mother   . Cancer Father   . Anesthesia problems Neg Hx   . Hypotension Neg Hx   . Malignant hyperthermia Neg Hx   . Pseudochol deficiency Neg Hx     Prior to Admission medications   Medication Sig Start Date End Date Taking? Authorizing Provider  ALPRAZolam (XANAX) 0.25 MG tablet Take 0.25 mg by mouth 3 (three) times daily as needed for anxiety. 08/26/16   [provider]  Amino Acids-Protein Hydrolys (FEEDING SUPPLEMENT, PRO-STAT SUGAR FREE 64,) LIQD Take 30 mLs by mouth 2 (two) times daily. Patient not taking: Reported on 09/18/2016 08/28/16   Lavina Hamman, MD  aspirin EC 81 MG tablet Take 81 mg by mouth daily at 12 noon.     [provider]  calcitRIOL (ROCALTROL) 0.5 MCG capsule Take 1 capsule (0.5 mcg total) by mouth every Monday, Wednesday, and Friday with hemodialysis. 07/25/16   Angiulli, Lavon Paganini, PA-C  calcium acetate (PHOSLO) 667 MG capsule Take 1 capsule (667 mg total) by mouth 3 (three) times daily with meals. 07/24/16   Angiulli, Lavon Paganini, PA-C  carvedilol (COREG) 3.125 MG tablet Take 1 tablet (3.125 mg total) by mouth 2 (two) times daily with a meal. 08/13/16   Rai, Ripudeep K, MD  enoxaparin (LOVENOX) 40 MG/0.4ML injection Inject 0.4 mLs (40  mg total) into the skin daily. Patient not taking: Reported on 09/18/2016 08/29/16 08/29/17  Lavina Hamman, MD  feeding supplement (BOOST / RESOURCE BREEZE) LIQD Take 1 Container by mouth 3 (three) times daily between meals. 04/25/16   Reyne Dumas, MD  levothyroxine (SYNTHROID, LEVOTHROID) 175 MCG tablet Take 1 tablet (175 mcg total) by mouth daily at 12 noon. 07/24/16   Angiulli, Lavon Paganini, PA-C  methocarbamol (ROBAXIN) 500 MG tablet Take 0.5 tablets (250 mg total) by mouth every 6 (six) hours as needed for muscle spasms. 07/24/16   Angiulli, Lavon Paganini, PA-C  metoCLOPramide (REGLAN) 5 MG tablet Take 1 tablet (5 mg total) by mouth 2 (two) times daily before a meal. Decrease to once a day before breakfast after a couple of weeks. 07/24/16   Angiulli, Lavon Paganini, PA-C  mirtazapine (REMERON) 15 MG tablet Take 15 mg by mouth daily. 08/17/16   [provider]  multivitamin (RENA-VIT) TABS tablet Take 1 tablet by mouth at bedtime. Patient not taking: Reported on 08/27/2016 05/20/16   Love, Ivan Anchors, PA-C  nitroGLYCERIN (NITROSTAT) 0.4 MG SL tablet Place 1 tablet (0.4 mg total) under the tongue every 5 (five) minutes as needed for chest pain. 03/20/14   Elgergawy, Silver Huguenin, MD  ondansetron (ZOFRAN ODT) 8 MG disintegrating tablet 8mg  ODT q4 hours prn nausea 09/18/16   Veryl Speak, MD  oxyCODONE (OXY IR/ROXICODONE) 5 MG immediate release tablet Take 1 tablet (5 mg total) by mouth every 4 (four) hours as needed for severe pain. 09/18/16   Veryl Speak, MD  tacrolimus (PROGRAF) 1 MG capsule Take 2 mg by mouth 2 (two) times daily.    [provider]  warfarin (COUMADIN) 2 MG tablet Take 1-2 tablets by mouth daily as directed by coumadin clinic 09/12/16   Troy Sine, MD    Physical Exam: Vitals:   09/28/16 2034 09/29/16 0016  BP:  122/81 109/70  Pulse: 86 88  Resp: 15 19  Temp: 97.7 F (36.5 C) 97.8 F (36.6 C)  TempSrc: Axillary Axillary  SpO2: 95% 100%  Weight: 46.9 kg (103 lb 8 oz)       General:  Appears calm and comfortable and is NAD, very somnolent Eyes:  EOMI, normal lids, iris ENT:  grossly normal hearing, lips & tongue, mmm Neck:  no LAD, masses or thyromegaly; no carotid bruits Cardiovascular:  RRR, +click, no r/g.   Respiratory:   CTA bilaterally with no wheezes/rales/rhonchi.  Normal respiratory effort. Abdomen:  soft, midepigastric TTP, ND, NABS Skin:  no rash or induration seen on limited exam Musculoskeletal: B BKA stumps are well healed Psychiatric: somnolent with blunted mood and affect, speech slurred mildly and slow but otherwise appropriate, AOx3 Neurologic:  CN 2-12 grossly intact, moves all extremities in coordinated fashion, sensation intact    Radiological Exams on Admission: No results found.  EKG: Independently reviewed.  Afib with rate 88; nonspecific ST changes with no evidence of acute ischemia   Labs on Admission: I have personally reviewed the available labs and imaging studies at the time of the admission.  Pertinent labs:   Troponin 5.41, 4.47, 2.76 INR 3.48 TSH 16.599 MRSA negative Creatinine 5 Pro-BNP (at [place]) [phone] - repeat here is pending  Assessment/Plan Principal Problem:   Syncope Active Problems:   End stage renal disease on dialysis (HCC)   Chronic combined systolic and diastolic congestive heart failure (HCC)   Chronic anticoagulation w/ coumadin, goal INR 2.5-3.0 w/ AVR   Elevated troponin   Benign essential HTN   History of liver transplant ([place])   Acute encephalopathy   -Patient with multiple medical problems admitted for possible syncopal episode today, now with apparent encephalopathy  Neuro/Psych:  Patient is able to wake up and answer questions appropriately but is quite somnolent.  She also had a possible syncopal episode earlier today (daughter reports that patient stopped breathing and lost her pulse, but this is not verified).   -Etiology is not clear. The differential diagnosis is broad,  including vasovagal syncope, seizure, TIA/stroke given right facial droop, arrhythmia, ACS, alcohol intoxication, drug abuse, orthostatic status, carotid artery stenosis -Will monitor on telemetry -Trending troponins (see below) -MRI-brain -2d echo repeat (see below) -Neuro checks  -check TSH -Continue ASA  CV:  Patient with known CAD and recent cath, currently being treated medically. -7/2 Echo was markedly different from prior, with new EF 30% (prior normal) -Subsequent cath 7/3 showed possible new ischemia with possible PCI target of the ostial rPDA but the decision was made to treat medically in the absence of ongoing, active chest pain. -The patient is now complaining of chest pain and her troponin is significantly elevated compared to last hospitalization. -Additionally, she had a markedly elevated BNP - although I am told that [facility] uses a different test and so this may not be as alarming as initially thought. -Both her BNP and troponin may be impacted by her ESRD.  -She is also a poor candidate for intervention -Patient was discussed with Dr. Wynonia Lawman; cardiology will see her in the AM -A substantial part of her current cardiac issue may be related to volume overload, as this has been a recurrent issue for her, resulting in multiple recent hospitalizations.  She is due for HD tomorrow (see below).  Pulm:  She does have a reported h/o COPD but does not appear to be in respiratory distress at this time. -She does not appear  to be taking nay medications for lung-related issues.  GI:  Continue Reglan.  No current complaints.   -She is s/p liver transplant and is taking Tacrolimus.  GU:  Anuric due to ESRD.  Renal:  ESRD.  Receives HD on MWF.  There is an ongoing question about the ability to pull of more fluid since she does not yet appear to be at her dry weight and since she continues to be admitted with volume overload. -Some of her other issues may improve following  dialysis. -Continue Renavite, Phoslo, and Calcitriol. -Message left on maintenance dialysis line to be seen tomorrow.  FEN:  Patient was given 400 cc bolus of IVF en route to [facility] due to hypotension; will not continue at this time if possible. -Patient was also started on a bicarbonate drip; this was presumably for mild hyperkalemia but does not appear to be necessary at this time.  Endo:  TSH is markedly abnormal.  Prior TSH was normal in 1/18.  Will increase current dose of Synthroid to 200 mcg daily.  She will need recheck in 4-6 weeks. -Patient does not have known h/o DM despite multiple checks of A1c. -She is on Remeron.  Heme/Onc:  Patient has an artificial valve and afib and is on Coumadin chronically.  She has had difficulty finding the correct dose, with subtherapeutic level on prior hospitalization requiring Lovenox bridge.  She is currently supratherapeutic.  Will request pharmacy assistance in dosing.  ID:  No current concern for infection.  MSK:  S/p B BKA.  Non-ambulatory.  Continue Robaxin and Oxy IR prn.  Derm:  No apparent issues.     DVT prophylaxis: Coumadin Code Status:  DNR - confirmed with patient/family Family Communication: Daughter present throughout evaluation Disposition Plan:  Home once clinically improved Consults called: Nephrology; cardiology  Admission status: Admit - It is my clinical opinion that admission to INPATIENT is reasonable and necessary because this patient will require at least 2 midnights in the hospital to treat this condition based on the medical complexity of the problems presented.  Given the aforementioned information, the predictability of an adverse outcome is felt to be significant.   Total critical care time: 65 minutes Critical care time was exclusive of separately billable procedures and treating other patients. Critical care was necessary to treat or prevent imminent or life-threatening deterioration. Critical care was time  spent personally by me on the following activities: development of treatment plan with patient and/or surrogate as well as nursing, discussions with consultants, evaluation of patient's response to treatment, examination of patient, obtaining history from patient or surrogate, ordering and performing treatments and interventions, ordering and review of laboratory studies, ordering and review of radiographic studies, pulse oximetry and re-evaluation of patient's condition.  Karmen Bongo MD [facility]  If note is complete, please contact covering daytime or nighttime physician. [email or web address] Password [email or web address]  09/29/2016, 1:33 AM

## 2016-09-29 ENCOUNTER — Encounter (HOSPITAL_COMMUNITY): Payer: Self-pay | Admitting: General Practice

## 2016-09-29 DIAGNOSIS — G9340 Encephalopathy, unspecified: Secondary | ICD-10-CM | POA: Diagnosis present

## 2016-09-29 DIAGNOSIS — I21A1 Myocardial infarction type 2: Principal | ICD-10-CM

## 2016-09-29 DIAGNOSIS — I5042 Chronic combined systolic (congestive) and diastolic (congestive) heart failure: Secondary | ICD-10-CM

## 2016-09-29 DIAGNOSIS — Z7901 Long term (current) use of anticoagulants: Secondary | ICD-10-CM

## 2016-09-29 LAB — BASIC METABOLIC PANEL
Anion gap: 6 (ref 5–15)
BUN: 23 mg/dL — ABNORMAL HIGH (ref 6–20)
CHLORIDE: 98 mmol/L — AB (ref 101–111)
CO2: 29 mmol/L (ref 22–32)
Calcium: 7.9 mg/dL — ABNORMAL LOW (ref 8.9–10.3)
Creatinine, Ser: 6 mg/dL — ABNORMAL HIGH (ref 0.44–1.00)
GFR calc non Af Amer: 7 mL/min — ABNORMAL LOW (ref >60)
GFR, EST AFRICAN AMERICAN: 8 mL/min — AB (ref >60)
Glucose, Bld: 103 mg/dL — ABNORMAL HIGH (ref 65–99)
POTASSIUM: 5.2 mmol/L — AB (ref 3.5–5.1)
Sodium: 133 mmol/L — ABNORMAL LOW (ref 135–145)

## 2016-09-29 LAB — CBC
HCT: 34 % — ABNORMAL LOW (ref 36.0–46.0)
HEMATOCRIT: 31.7 % — AB (ref 36.0–46.0)
HEMOGLOBIN: 10.7 g/dL — AB (ref 12.0–15.0)
Hemoglobin: 11.4 g/dL — ABNORMAL LOW (ref 12.0–15.0)
MCH: 29.4 pg (ref 26.0–34.0)
MCH: 29.1 pg (ref 26.0–34.0)
MCHC: 33.8 g/dL (ref 30.0–36.0)
MCHC: 33.5 g/dL (ref 30.0–36.0)
MCV: 87.1 fL (ref 78.0–100.0)
MCV: 86.7 fL (ref 78.0–100.0)
Platelets: 103 10*3/uL — ABNORMAL LOW (ref 150–400)
Platelets: 95 K/uL — ABNORMAL LOW (ref 150–400)
RBC: 3.64 MIL/uL — AB (ref 3.87–5.11)
RBC: 3.92 MIL/uL (ref 3.87–5.11)
RDW: 17.8 % — ABNORMAL HIGH (ref 11.5–15.5)
RDW: 17.4 % — ABNORMAL HIGH (ref 11.5–15.5)
WBC: 11.8 10*3/uL — AB (ref 4.0–10.5)
WBC: 11.9 10*3/uL — ABNORMAL HIGH (ref 4.0–10.5)

## 2016-09-29 LAB — RENAL FUNCTION PANEL
Albumin: 1.8 g/dL — ABNORMAL LOW (ref 3.5–5.0)
Anion gap: 8 (ref 5–15)
BUN: 7 mg/dL (ref 6–20)
CO2: 27 mmol/L (ref 22–32)
Calcium: 7.8 mg/dL — ABNORMAL LOW (ref 8.9–10.3)
Chloride: 102 mmol/L (ref 101–111)
Creatinine, Ser: 2.22 mg/dL — ABNORMAL HIGH (ref 0.44–1.00)
GFR calc Af Amer: 26 mL/min — ABNORMAL LOW (ref >60)
GFR calc non Af Amer: 23 mL/min — ABNORMAL LOW (ref >60)
Glucose, Bld: 91 mg/dL (ref 65–99)
Phosphorus: 2.2 mg/dL — ABNORMAL LOW (ref 2.5–4.6)
Potassium: 3.3 mmol/L — ABNORMAL LOW (ref 3.5–5.1)
Sodium: 137 mmol/L (ref 135–145)

## 2016-09-29 LAB — TROPONIN I
TROPONIN I: 2.8 ng/mL — AB (ref <0.03)
Troponin I: 2.47 ng/mL (ref <0.03)
Troponin I: 2.76 ng/mL (ref <0.03)

## 2016-09-29 LAB — GLUCOSE, CAPILLARY
GLUCOSE-CAPILLARY: 83 mg/dL (ref 65–99)
GLUCOSE-CAPILLARY: 42 mg/dL — AB (ref 65–99)
Glucose-Capillary: 56 mg/dL — ABNORMAL LOW (ref 65–99)
Glucose-Capillary: 85 mg/dL (ref 65–99)
Glucose-Capillary: 159 mg/dL — ABNORMAL HIGH (ref 65–99)

## 2016-09-29 LAB — TSH: TSH: 16.599 u[IU]/mL — ABNORMAL HIGH (ref 0.350–4.500)

## 2016-09-29 LAB — BRAIN NATRIURETIC PEPTIDE: B Natriuretic Peptide: 2546.5 pg/mL — ABNORMAL HIGH (ref 0.0–100.0)

## 2016-09-29 LAB — MRSA PCR SCREENING: MRSA BY PCR: NEGATIVE

## 2016-09-29 MED ORDER — HEPARIN SODIUM (PORCINE) 1000 UNIT/ML DIALYSIS
1000.0000 [IU] | INTRAMUSCULAR | Status: DC | PRN
  Filled 2016-09-29: qty 1

## 2016-09-29 MED ORDER — LIDOCAINE-PRILOCAINE 2.5-2.5 % EX CREA
1.0000 "application " | TOPICAL_CREAM | CUTANEOUS | Status: DC | PRN
  Filled 2016-09-29: qty 5

## 2016-09-29 MED ORDER — SODIUM CHLORIDE 0.9 % IV SOLN
100.0000 mL | INTRAVENOUS | Status: DC | PRN
  Administered 2016-09-29: 100 mL via INTRAVENOUS

## 2016-09-29 MED ORDER — CALCIUM ACETATE (PHOS BINDER) 667 MG PO CAPS
1334.0000 mg | ORAL_CAPSULE | Freq: Three times a day (TID) | ORAL | Status: DC
  Administered 2016-09-29 – 2016-10-02 (×8): 1334 mg via ORAL
  Filled 2016-09-29 (×8): qty 2

## 2016-09-29 MED ORDER — SODIUM CHLORIDE 0.9 % IV BOLUS (SEPSIS)
500.0000 mL | Freq: Once | INTRAVENOUS | Status: AC
  Administered 2016-09-29: 500 mL via INTRAVENOUS

## 2016-09-29 MED ORDER — PRO-STAT SUGAR FREE PO LIQD
30.0000 mL | Freq: Two times a day (BID) | ORAL | Status: DC
  Administered 2016-09-29 – 2016-09-30 (×3): 30 mL via ORAL
  Filled 2016-09-29 (×6): qty 30

## 2016-09-29 MED ORDER — NITROGLYCERIN 0.4 MG SL SUBL: 0.4000 mg | SUBLINGUAL_TABLET | SUBLINGUAL | Status: DC | PRN

## 2016-09-29 MED ORDER — PNEUMOCOCCAL VAC POLYVALENT 25 MCG/0.5ML IJ INJ: 0.5000 mL | INJECTION | INTRAMUSCULAR | Status: DC | PRN

## 2016-09-29 MED ORDER — ALTEPLASE 2 MG IJ SOLR: 2.0000 mg | Freq: Once | INTRAMUSCULAR | Status: DC | PRN

## 2016-09-29 MED ORDER — WARFARIN SODIUM 2 MG PO TABS
2.0000 mg | ORAL_TABLET | Freq: Every day | ORAL | Status: DC
  Administered 2016-09-29: 2 mg via ORAL
  Filled 2016-09-29: qty 1

## 2016-09-29 MED ORDER — CARVEDILOL 3.125 MG PO TABS
3.1250 mg | ORAL_TABLET | Freq: Two times a day (BID) | ORAL | Status: DC
  Administered 2016-09-30 – 2016-10-02 (×4): 3.125 mg via ORAL
  Filled 2016-09-29 (×4): qty 1

## 2016-09-29 MED ORDER — PENTAFLUOROPROP-TETRAFLUOROETH EX AERO: 1.0000 "application " | INHALATION_SPRAY | CUTANEOUS | Status: DC | PRN

## 2016-09-29 MED ORDER — WARFARIN - PHARMACIST DOSING INPATIENT: Freq: Every day | Status: DC

## 2016-09-29 MED ORDER — DOXERCALCIFEROL 0.5 MCG PO CAPS
0.5000 ug | ORAL_CAPSULE | ORAL | Status: DC
  Administered 2016-09-29 – 2016-10-01 (×2): 0.5 ug via ORAL
  Filled 2016-09-29 (×2): qty 1

## 2016-09-29 MED ORDER — LIDOCAINE HCL (PF) 1 % IJ SOLN: 5.0000 mL | INTRAMUSCULAR | Status: DC | PRN

## 2016-09-29 MED ORDER — MIDODRINE HCL 5 MG PO TABS
5.0000 mg | ORAL_TABLET | Freq: Two times a day (BID) | ORAL | Status: DC
  Administered 2016-09-29 – 2016-09-30 (×2): 5 mg via ORAL
  Filled 2016-09-29 (×2): qty 1

## 2016-09-29 MED ORDER — SODIUM CHLORIDE 0.9 % IV SOLN: 100.0000 mL | INTRAVENOUS | Status: DC | PRN

## 2016-09-29 MED ORDER — LEVOTHYROXINE SODIUM 100 MCG PO TABS
200.0000 ug | ORAL_TABLET | Freq: Every day | ORAL | Status: DC
  Administered 2016-09-29 – 2016-10-02 (×3): 200 ug via ORAL
  Filled 2016-09-29 (×4): qty 2

## 2016-09-29 NOTE — Progress Notes (Signed)
ANTICOAGULATION CONSULT NOTE - Initial Consult  Pharmacy Consult for Warfarin  Indication: Mechanical AVR  Allergies  Allergen Reactions  . Acetaminophen Other (See Comments)    Liver transplant recipient   . Codeine Itching  . Mirtazapine Other (See Comments)    hallucination    Patient Measurements: Weight: 103 lb 8 oz (46.9 kg)  Vital Signs: Temp: 97.7 F (36.5 C) (08/19 2034) Temp Source: Axillary (08/19 2034) BP: 122/81 (08/19 2034) Pulse Rate: 86 (08/19 2034)  Labs:  Recent Labs  09/28/16 2330  LABPROT 35.8*  INR 3.48    Estimated Creatinine Clearance: 11.9 mL/min (A) (by C-G formula based on SCr of 3.62 mg/dL (H)).   Medical History: Past Medical History:  Diagnosis Date  . Anemia    takes Folic Acid daily  . Anxiety   . Arthritis    "left hand, back" (08/30/2012)  . Asthma   . CAD (coronary artery disease) Jan. 2015   Cath: 20% LAD, 50% D1; s/p LIMA-LAD  . Chest pain 03/06/2015  . CHF (congestive heart failure) ([place]) 05/2016  . Chronic back pain   . Chronic constipation    takes MIralax and Colace daily  . COPD (chronic obstructive pulmonary disease) ([place])   . Depression    takes Cymbalta for "severe" depression  . ESRD on dialysis [facility])    "MWF; [facility]" (08/12/2016)  . GERD (gastroesophageal reflux disease)    takes Omeprazole daily  . Headache    "at least monthly" (08/30/2012)  . Hepatitis C   . History of blood transfusion    "several" (08/30/2012)  . Hypertension    takes Metoprolol and Lisinopril daily, sees Dr Bea Graff  . Hypothyroidism    takes Synthroid daily  . Migraine    "last migraine was in 2013" (08/30/2012)  . Neuromuscular disorder ([place])    carpal tunnel in right hand  . Obesity   . Peripheral vascular disease (HCC) hands and legs  . Pneumonia    "today and several times before" (08/30/2012)  . 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 S/P bilateral BKA (below knee amputation) ([place]) 04/30/2016  . S/P liver  transplant ([place])    2011 at [facility] (cirrhosis due to hep C, got hep C from blood transfuion in 1980's per pt))  . SVT (supraventricular tachycardia) ([place]) 06/09/14  . Tobacco abuse   . Type II diabetes mellitus (HCC)    Levemir 2units daily if > 150    Assessment: 61 y/o F transfer from [place] with dizziness/chest pain, on warfarin PTA for mechanical AVR, INR is therapeutic at 3.48 (goal 2.5-3.5).   Goal of Therapy:  INR 2.5-3.5 Monitor platelets by anticoagulation protocol: Yes   Plan:  -Warfarin 2 mg daily at 1800 -Daily PT/INR -Monitor for bleeding  Narda Bonds 09/29/2016,12:15 AM

## 2016-09-29 NOTE — Progress Notes (Signed)
Pt returned from dialysis, BP low, MD paged voa Amion. Pt alert, answers questions appropriately, collows commands.

## 2016-09-29 NOTE — Progress Notes (Signed)
Pt still has low BP;  C/o blurred vision.  Blood sugar checked, 42;  Juice and snack given.

## 2016-09-29 NOTE — Progress Notes (Addendum)
Patient ID: [name], female   DOB: Oct 08, 1955, 61 y.o.   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 Daughter provided most of the history on admission as pt was lethargic. Pt was transferred from [facility] for further evaluation and concern with troponin ~5.   Per recent record review, pt was admitted: -7/18-20 for acute on chronic diastolic dysfunction thought to be related to insufficient volume being drawn off at HD, leading to chronic volume overload.  She also had bacteremia and a subtherapeutic INR.  She left AMA. -6/30-7/5 for respiratory failure associated with volume overload.  Echo showed lateral wall akinesis and a marked change in EF.  She underwent cardiac cath which showed probable new disease but the decision was made for medical management. -6/21-23 for respiratory failure associated with volume overload. -5/21-31 (with rehab stay through 6/12) for R BKA (5/29), following L BKA 3 months prior  Assessment & Plan:   Acute metabolic encephalopathy, syncope - suspect this is multifactorial in the setting of chronic medical issues, ? Cardiac ischemia, hypotension  - TSH also elevated at 16, ? Medical compliance  - still somnolent this am but easy to awake - pt is scheduled for HD today, nephrology team notified - cardiology also consulted for assistance  - monitor mental status   Elevated troponin, Hypotension  - in pt with known CAD, EF 30% - pt has some chest pain concerns yesterday but too somnolent to verbalize this am - cardiology has been consulted - appreciated assistance   COPD - resp status stable this AM  ESRD, hyperkalemia  - nephrology consulted  - Continue Renavite, Phoslo, and Calcitriol. -  address K with HD  Hypothyroidism - TSH elevated - dose of Synthroid increased to 200 mcg daily   S/P B BKA  Anemia of chronic disease, thrombocytopenia - monitor counts   Underweight  - Body mass index is 17.75 kg/m.   DVT prophylaxis: Coumadin Code Status: DNR Family Communication: Patient and daughter at bedside  Disposition Plan: to be determined   Consultants:   Cardiology  Nephrology   Procedures:   None  Antimicrobials:   None  Subjective: Somnolent this AM.  Objective: Vitals:   09/29/16 1100 09/29/16 1105 09/29/16 1130 09/29/16 1200  BP: (!) 82/40 (!) 93/28 (!) 98/24 (!) 99/47  Pulse: 90 96 100 98  Resp:      Temp:      TempSrc:      SpO2:      Weight:        Intake/Output Summary (Last 24 hours) at 09/29/16 1237 Last data filed at 09/28/16 2345  Gross per 24 hour  Intake              120 ml  Output                0 ml  Net              120 ml   Filed Weights   09/28/16 2034 09/29/16 0654 09/29/16 1022  Weight: 46.9 kg (103 lb 8 oz) 42.9 kg (94 lb 9.6 oz) 46.9 kg (103 lb 6.3 oz)    Examination:  General exam: Appears somnolent  Respiratory system: Respiratory effort normal. Cardiovascular system: S1 & S2 heard, RRR. No rubs, gallops or clicks.  Gastrointestinal system: Abdomen is nondistended, soft and nontender. No organomegaly or masses felt.   Data Reviewed: I have personally reviewed following labs and imaging studies  CBC:  Recent Labs Lab 09/29/16 0510  WBC 11.8*  HGB 10.7*  HCT 31.7*  MCV 87.1  PLT 121*   Basic Metabolic Panel:  Recent Labs Lab 09/29/16 0510  NA 133*  K 5.2*  CL 98*  CO2 29  GLUCOSE 103*  BUN 23*  CREATININE 6.00*  CALCIUM 7.9*   Coagulation Profile:  Recent Labs Lab 09/28/16 2330  INR 3.48   Cardiac Enzymes:  Recent Labs Lab 09/28/16 2330 09/29/16 0510 09/29/16 0935  TROPONINI 2.76* 2.47* 2.80*   CBG:  Recent Labs Lab 09/29/16 0754  GLUCAP 83   Thyroid Function  Tests:  Recent Labs  09/28/16 2330  TSH 16.599*   Urine analysis:    Component Value Date/Time   COLORURINE YELLOW 11/02/2012 0428   APPEARANCEUR CLOUDY (A) 11/02/2012 0428   LABSPEC 1.011 11/02/2012 0428   PHURINE 8.0 11/02/2012 0428   GLUCOSEU 250 (A) 11/02/2012 0428   HGBUR LARGE (A) 11/02/2012 0428   BILIRUBINUR NEGATIVE 11/02/2012 0428   KETONESUR NEGATIVE 11/02/2012 0428   PROTEINUR >300 (A) 11/02/2012 0428   UROBILINOGEN 0.2 11/02/2012 0428   NITRITE NEGATIVE 11/02/2012 0428   LEUKOCYTESUR NEGATIVE 11/02/2012 0428   Recent Results (from the past 240 hour(s))  MRSA PCR Screening     Status: None   Collection Time: 09/28/16 11:43 PM  Result Value Ref Range Status   MRSA by PCR NEGATIVE NEGATIVE Final    Comment:        The GeneXpert MRSA Assay (FDA approved for NASAL specimens only), is one component of a comprehensive MRSA colonization surveillance program. It is not intended to diagnose MRSA infection nor to guide or monitor treatment for MRSA infections.     Radiology Studies: No results found.  Scheduled Meds: . aspirin EC  81 mg Oral Q1200  . calcium acetate  667 mg Oral TID WC  . carvedilol  3.125 mg Oral BID WC  . docusate sodium  100 mg Oral BID  . doxercalciferol  0.5 mcg Oral Q M,W,F-HD  . levothyroxine  200 mcg Oral QAC breakfast  . metoCLOPramide  5 mg Oral BID AC  . mirtazapine  15 mg Oral Daily  . multivitamin  1 tablet Oral QHS  . sodium chloride flush  3 mL Intravenous Q12H  . tacrolimus  2 mg Oral BID  . warfarin  2 mg Oral q1800  . Warfarin - Pharmacist Dosing Inpatient   Does not apply q1800   Continuous Infusions: . sodium chloride    . sodium chloride       LOS: 1 day   Time spent: 35 minutes   Faye Ramsay, MD [facility] Pager [phone]  If 7PM-7AM, please contact night-coverage [email or web address] Password [facility] 09/29/2016, 12:37 PM

## 2016-09-29 NOTE — Consult Note (Signed)
Cardiology Consultation:   Patient ID: [name]; [id number]; 05/20/1955   Admit date: 09/28/2016 Date of Consult: 09/29/2016  Primary Care Provider: Clovia Cuff, MD Primary Cardiologist: Wynonia Lawman Primary Electrophysiologist:  none   Patient Profile:   [name] is a 61 y.o. female with a hx of ESRD who is being seen today for the evaluation of NSTEMI at the request of Dr. Doyle Askew.  History of Present Illness:   [name] is a 61 year old female with known coronary artery disease status post bypass, LIMA to LAD, mechanical aortic valve, bilateral BKA, multiple failed HD access, history of hepatitis C with liver transplant in 2011, COPD, below-knee amputations as well, depression, end-stage renal disease Monday Wednesday Friday was transferred from [facility] with elevated troponin. Troponin at last check was 2.8.  On Sunday was brought from the emergency department at [facility] to [facility] after a syncopal episode/altered mental status and was found to have a troponin of 5. Per records she started to complain of some chest discomfort. In review of nephrology consultation earlier today, history was different in that she stated that she had nausea and vomiting and became "so weak that she doesn't remember all that happened "but her daughter says that a similar thing happened 2 weeks ago when she was seen at [facility]. She did not have any complaints of chest pain, shortness of breath, fevers. She did have nausea and vomiting previously and some dizziness. She currently is feeling better. Does not seem to have any altered mental status currently.  She takes aspirin, carvedilol low-dose, warfarin.  Her daughter is very skilled in her mgt/care and awareness of her medical issues.     Past Medical History:  Diagnosis Date  . Anemia    takes Folic Acid daily  . Anxiety   . Arthritis    "left hand, back" (08/30/2012)  . Asthma   . CAD (coronary artery disease) Jan.  2015   Cath: 20% LAD, 50% D1; s/p LIMA-LAD  . Chest pain 03/06/2015  . CHF (congestive heart failure) ([place]) 05/2016  . Chronic back pain   . Chronic constipation    takes MIralax and Colace daily  . COPD (chronic obstructive pulmonary disease) ([place])   . Depression    takes Cymbalta for "severe" depression  . ESRD on dialysis [facility])    "MWF; [facility]" (08/12/2016)  . GERD (gastroesophageal reflux disease)    takes Omeprazole daily  . Headache    "at least monthly" (08/30/2012)  . Hepatitis C   . History of blood transfusion    "several" (08/30/2012)  . Hypertension    takes Metoprolol and Lisinopril daily, sees Dr Bea Graff  . Hypothyroidism    takes Synthroid daily  . Migraine    "last migraine was in 2013" (08/30/2012)  . Neuromuscular disorder ([place])    carpal tunnel in right hand  . Obesity   . Peripheral vascular disease (HCC) hands and legs  . Pneumonia    "today and several times before" (08/30/2012)  . 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 S/P bilateral BKA (below knee amputation) ([place]) 04/30/2016  . 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 SVT (supraventricular tachycardia) ([place]) 06/09/14  . Tobacco abuse   . Type II diabetes mellitus (HCC)    Levemir 2units daily if > 150    Past Surgical History:  Procedure Laterality Date  . ABDOMINAL AORTOGRAM W/LOWER EXTREMITY N/A 07/04/2016   Procedure: Abdominal Aortogram w/Lower Extremity;  Surgeon: Elam Dutch, MD;  Location: [place] CV LAB;  Service: Cardiovascular;  Laterality: N/A;  . AMPUTATION Left 04/24/2016   Procedure: AMPUTATION BELOW KNEE;  Surgeon: Angelia Mould, MD;  Location: [place];  Service: Vascular;  Laterality: Left;  . AMPUTATION Right 07/08/2016   Procedure: AMPUTATION BELOW KNEE;  Surgeon: Angelia Mould, MD;  Location: [place];  Service: Vascular;  Laterality: Right;  . AORTIC VALVE  REPLACEMENT N/A 03/15/2013   AVR; Surgeon: Ivin Poot, MD;  Location: [facility] OR; Open Heart Surgery;  51mm[employer] mechanical prosthesis, top hat valve  . ARTERIOVENOUS GRAFT PLACEMENT Left 10/03/10    forearm  . AV FISTULA PLACEMENT  01/29/2011   Procedure: INSERTION OF ARTERIOVENOUS (AV) GORE-TEX GRAFT ARM;  Surgeon: Elam Dutch, MD;  Location: [facility] OR;  Service: Vascular;  Laterality: Right;  . AV FISTULA PLACEMENT  03/10/2011   Procedure: INSERTION OF ARTERIOVENOUS (AV) GORE-TEX GRAFT THIGH;  Surgeon: Elam Dutch, MD;  Location: [place];  Service: Vascular;  Laterality: Left;  . AV FISTULA PLACEMENT Left 07/21/2016   Procedure: CREATION OF LEFT UPPER ARM ARTERIOVENOUS (AV) FISTULA;  Surgeon: Conrad North Irwin, MD;  Location: [place];  Service: Vascular;  Laterality: Left;  . [place] REMOVAL  12/23/2010   Procedure: REMOVAL OF ARTERIOVENOUS GORETEX GRAFT ([place]);  Surgeon: Elam Dutch, MD;  Location: [place];  Service: Vascular;  Laterality: Left;  procedure started @1736 -1852  . CHOLECYSTECTOMY  1993  . CORONARY ANGIOGRAPHY N/A 08/12/2016   Procedure: CORONARY ANGIOGRAPHY (CATH LAB);  Surgeon: Leonie Man, MD;  Location: [place] CV LAB;  Service: Cardiovascular;  Laterality: N/A;  . CORONARY ARTERY BYPASS GRAFT N/A 03/15/2013   Procedure: CORONARY ARTERY BYPASS GRAFTING (CABG) times one using left internal mammary artery.;  Surgeon: Ivin Poot, MD;  Location: [place];  Service: Open Heart Surgery;  Laterality: N/A;  POSS CABG X 1  . CYSTOSCOPY  1990's  . INSERTION OF DIALYSIS CATHETER  12/23/2010   Procedure: INSERTION OF DIALYSIS CATHETER;  Surgeon: Elam Dutch, MD;  Location: [place];  Service: Vascular;  Laterality: Right;  Right Internal Jugular 28cm dialysis catheter insertion procedure time 1701-1720   . INSERTION OF DIALYSIS CATHETER Right 03/11/2016   Procedure: INSERTION OF DIALYSIS CATHETER;  Surgeon: Angelia Mould, MD;  Location: [place];  Service: Vascular;   Laterality: Right;  . INTRAOPERATIVE TRANSESOPHAGEAL ECHOCARDIOGRAM N/A 03/15/2013   Procedure: INTRAOPERATIVE TRANSESOPHAGEAL ECHOCARDIOGRAM;  Surgeon: Ivin Poot, MD;  Location: [place];  Service: Open Heart Surgery;  Laterality: N/A;  . LEFT HEART CATHETERIZATION WITH CORONARY ANGIOGRAM N/A 07/29/2012   Procedure: LEFT HEART CATHETERIZATION WITH CORONARY ANGIOGRAM;  Surgeon: Troy Sine, MD;  Location: [facility] C[facility] LAB;  Service: Cardiovascular;  Laterality: N/A;  . LEFT HEART CATHETERIZATION WITH CORONARY ANGIOGRAM N/A 03/10/2013   Procedure: LEFT HEART CATHETERIZATION WITH CORONARY ANGIOGRAM;  Surgeon: Troy Sine, MD;  Location: [facility] CATH LAB;  Service: Cardiovascular;  Laterality: N/A;  . LEFT HEART CATHETERIZATION WITH CORONARY/GRAFT ANGIOGRAM N/A 12/24/2011   Procedure: LEFT HEART CATHETERIZATION WITH Beatrix Fetters;  Surgeon: [name], MD;  Location: [facility] CATH LAB;  Service: Cardiovascular;  Laterality: N/A;  . LEFT HEART CATHETERIZATION WITH CORONARY/GRAFT ANGIOGRAM N/A 12/16/2013   Procedure: LEFT HEART CATHETERIZATION WITH Beatrix Fetters;  Surgeon: Troy Sine, MD;  Location: [facility] CATH LAB;  Service: Cardiovascular;  Laterality: N/A;  . LIGATION ARTERIOVENOUS GORTEX GRAFT Left 03/11/2016   Procedure: LIGATION THIGH ARTERIOVENOUS GORTEX GRAFT;  Surgeon: Angelia Mould, MD;  Location: [place];  Service: Vascular;  Laterality: Left;  . LIGATION OF ARTERIOVENOUS  FISTULA Left 08/11/2016   Procedure: LIGATION OF ARTERIOVENOUS  FISTULA;  Surgeon: Elam Dutch, MD;  Location: [place];  Service: Vascular;  Laterality: Left;  . LIVER TRANSPLANT  10/25/2009   sees Dr Ferol Luz 1 every 6 months, saw last in Dec 2013. [name] Coord [phone]  . PERIPHERAL VASCULAR CATHETERIZATION N/A 11/06/2015   Procedure: Abdominal Aortogram;  Surgeon: Serafina Mitchell, MD;  Location: [place] CV LAB;  Service: Cardiovascular;  Laterality: N/A;  . PERIPHERAL VASCULAR  CATHETERIZATION N/A 11/06/2015   Procedure: Lower Extremity Angiography;  Surgeon: Serafina Mitchell, MD;  Location: [place] CV LAB;  Service: Cardiovascular;  Laterality: N/A;  . PERIPHERAL VASCULAR CATHETERIZATION  11/06/2015   Procedure: Peripheral Vascular Atherectomy;  Surgeon: Serafina Mitchell, MD;  Location: [place] CV LAB;  Service: Cardiovascular;;  Left Superficial femoral  . SHUNTOGRAM Left 05/15/2014   Procedure: SHUNTOGRAM;  Surgeon: Conrad Spencer, MD;  Location: [facility] CATH LAB;  Service: Cardiovascular;  Laterality: Left;  . SMALL INTESTINE SURGERY  90's  . SPINAL GROWTH RODS  2010   "put 2 metal rods in my back; they had detetriorated" (08/30/2012)  . THROMBECTOMY    . THROMBECTOMY AND REVISION OF ARTERIOVENTOUS (AV) GORETEX  GRAFT Left 03/30/2014  . THROMBECTOMY AND REVISION OF ARTERIOVENTOUS (AV) GORETEX  GRAFT Left 03/30/2014   Procedure: THROMBECTOMY AND REVISION OF ARTERIOVENTOUS (AV) GORETEX  GRAFT;  Surgeon: Conrad Satsuma, MD;  Location: [place];  Service: Vascular;  Laterality: Left;  . TUBAL LIGATION  1990's     Home Medications:  Prior to Admission medications   Medication Sig Start Date End Date Taking? Authorizing Provider  ALPRAZolam Duanne Moron) 0.5 MG tablet Take 0.5 mg by mouth 3 (three) times daily as needed for anxiety.  08/26/16  Yes [provider]  aspirin EC 81 MG tablet Take 81 mg by mouth daily at 12 noon.    Yes [provider]  carvedilol (COREG) 3.125 MG tablet Take 1 tablet (3.125 mg total) by mouth 2 (two) times daily with a meal. 08/13/16  Yes Rai, Ripudeep K, MD  levothyroxine (SYNTHROID, LEVOTHROID) 175 MCG tablet Take 1 tablet (175 mcg total) by mouth daily at 12 noon. 07/24/16  Yes Angiulli, Lavon Paganini, PA-C  metoCLOPramide (REGLAN) 5 MG tablet Take 1 tablet (5 mg total) by mouth 2 (two) times daily before a meal. Decrease to once a day before breakfast after a couple of weeks. 07/24/16  Yes Angiulli, Lavon Paganini, PA-C  mirtazapine (REMERON) 15 MG  tablet Take 15 mg by mouth daily. 08/17/16  Yes [provider]  nitroGLYCERIN (NITROSTAT) 0.4 MG SL tablet Place 1 tablet (0.4 mg total) under the tongue every 5 (five) minutes as needed for chest pain. 03/20/14  Yes Elgergawy, Silver Huguenin, MD  ondansetron (ZOFRAN ODT) 8 MG disintegrating tablet 8mg  ODT q4 hours prn nausea Patient taking differently: Take 8 mg by mouth every 4 (four) hours as needed for nausea or vomiting.  09/18/16  Yes Delo, Nathaneil Canary, MD  oxyCODONE (OXY IR/ROXICODONE) 5 MG immediate release tablet Take 1 tablet (5 mg total) by mouth every 4 (four) hours as needed for severe pain. 09/18/16  Yes Delo, Nathaneil Canary, MD  tacrolimus (PROGRAF) 1 MG capsule Take 2 mg by mouth  2 (two) times daily.    Yes [provider]  traZODone (DESYREL) 50 MG tablet Take 50 mg by mouth at bedtime.   Yes [provider]  warfarin (COUMADIN) 2 MG tablet Take 1-2 tablets by mouth daily as directed by coumadin clinic Patient taking differently: Take 2 mg by mouth at bedtime.  09/12/16  Yes Troy Sine, MD  Amino Acids-Protein Hydrolys (FEEDING SUPPLEMENT, PRO-STAT SUGAR FREE 64,) LIQD Take 30 mLs by mouth 2 (two) times daily. Patient not taking: Reported on 09/18/2016 08/28/16   Lavina Hamman, MD  calcitRIOL (ROCALTROL) 0.5 MCG capsule Take 1 capsule (0.5 mcg total) by mouth every Monday, Wednesday, and Friday with hemodialysis. Patient not taking: Reported on 09/29/2016 07/25/16   Angiulli, Lavon Paganini, PA-C  calcium acetate (PHOSLO) 667 MG capsule Take 1 capsule (667 mg total) by mouth 3 (three) times daily with meals. Patient not taking: Reported on 09/29/2016 07/24/16   Angiulli, Lavon Paganini, PA-C  feeding supplement (BOOST / RESOURCE BREEZE) LIQD Take 1 Container by mouth 3 (three) times daily between meals. 04/25/16   Reyne Dumas, MD  methocarbamol (ROBAXIN) 500 MG tablet Take 0.5 tablets (250 mg total) by mouth every 6 (six) hours as needed for muscle spasms. 07/24/16   Angiulli, Lavon Paganini, PA-C    multivitamin (RENA-VIT) TABS tablet Take 1 tablet by mouth at bedtime. Patient not taking: Reported on 08/27/2016 05/20/16   Bary Leriche, PA-C    Inpatient Medications: Scheduled Meds: . aspirin EC  81 mg Oral Q1200  . calcium acetate  1,334 mg Oral TID WC  . carvedilol  3.125 mg Oral BID WC  . docusate sodium  100 mg Oral BID  . doxercalciferol  0.5 mcg Oral Q M,W,F-HD  . feeding supplement (PRO-STAT SUGAR FREE 64)  30 mL Oral BID  . levothyroxine  200 mcg Oral QAC breakfast  . metoCLOPramide  5 mg Oral BID AC  . midodrine  5 mg Oral BID WC  . mirtazapine  15 mg Oral Daily  . multivitamin  1 tablet Oral QHS  . sodium chloride flush  3 mL Intravenous Q12H  . tacrolimus  2 mg Oral BID  . warfarin  2 mg Oral q1800  . Warfarin - Pharmacist Dosing Inpatient   Does not apply q1800   Continuous Infusions: . sodium chloride    . sodium chloride 100 mL (09/29/16 1700)   PRN Meds: sodium chloride, sodium chloride, ALPRAZolam, alteplase, bisacodyl, calcium carbonate (dosed in mg elemental calcium), camphor-menthol **AND** hydrOXYzine, docusate sodium, feeding supplement (NEPRO CARB STEADY), heparin, lidocaine (PF), lidocaine-prilocaine, methocarbamol, morphine injection, nitroGLYCERIN, ondansetron **OR** ondansetron (ZOFRAN) IV, oxyCODONE, pentafluoroprop-tetrafluoroeth, pneumococcal 23 valent vaccine, polyethylene glycol, sorbitol, zolpidem  Allergies:    Allergies  Allergen Reactions  . Acetaminophen Other (See Comments)    Liver transplant recipient   . Codeine Itching  . Mirtazapine Other (See Comments)    hallucination    Social History:   Social History   Social History  . Marital status: Divorced    Spouse name: N/A  . Number of children: N/A  . Years of education: N/A   Occupational History  . disabled    Social History Main Topics  . Smoking status: Current Every Day Smoker    Packs/day: 1.50    Years: 43.00    Types: Cigarettes  . Smokeless tobacco: Never  Used     Comment: 8/18 - down to <0.5 ppd  . Alcohol use No  . Drug use: No  .  Sexual activity: No   Other Topics Concern  . Not on file   Social History Narrative  . No narrative on file    Family History:    Family History  Problem Relation Age of Onset  . Cancer Mother   . Diabetes Mother   . Hypertension Mother   . Stroke Mother   . Cancer Father   . Anesthesia problems Neg Hx   . Hypotension Neg Hx   . Malignant hyperthermia Neg Hx   . Pseudochol deficiency Neg Hx      ROS:  Please see the history of present illness.  ROS  All other ROS reviewed and negative.     Physical Exam/Data:   Vitals:   09/29/16 1448 09/29/16 1617 09/29/16 1622 09/29/16 1641  BP: (!) 99/26 (!) 72/53 (!) 113/36 91/63  Pulse: 91 92 92 91  Resp: 13 16 (!) 8 15  Temp:      TempSrc:      SpO2: 99% 100% 100% 100%  Weight:        Intake/Output Summary (Last 24 hours) at 09/29/16 1732 Last data filed at 09/29/16 1407  Gross per 24 hour  Intake              120 ml  Output             1540 ml  Net            -1420 ml   Filed Weights   09/29/16 0654 09/29/16 1022 09/29/16 1407  Weight: 94 lb 9.6 oz (42.9 kg) 103 lb 6.3 oz (46.9 kg) 100 lb 1.4 oz (45.4 kg)   Body mass index is 17.18 kg/m.  General:  Elderly in no acute distress HEENT: normal Lymph: no adenopathy Neck: no JVD Endocrine:  No thryomegaly Vascular: No carotid bruits  Cardiac:  normal S1, Mechanical S2; RRR; no murmur  Lungs:  clear to auscultation bilaterally, no wheezing, rhonchi or rales  Abd: soft, nontender, no hepatomegaly  Ext: no edema Musculoskeletal:  Amputation noted  Skin: warm and dry  Neuro:  CNs 2-12 intact, no focal abnormalities noted Psych:  Normal affect   EKG:  The EKG was personally reviewed and demonstrates:  Sinus rhythm with anteroseptal infarct pattern, nonspecific ST-T wave changes  Telemetry:  Telemetry was personally reviewed and demonstrates:  No adverse arrhythmias, occasional  PVCs  Relevant CV Studies:  Echocardiogram 08/11/16: - Left ventricle: LVEF is depressed at approximately 30% with   hypokdinesis of the anterior, apical, lateral wallsl Akinesis of   the mid/distal inferior/inferoseptal walls.   These findings are new compared to echo from Jan 2018 The cavity   size was normal. Wall thickness was normal. - Aortic valve: AV prosthesis is present Peak and mean gradients   through the valve are 19 and 11 mm Hg respectively . There was   mild regurgitation. - Mitral valve: Calcified annulus. There was mild regurgitation. - Left atrium: The atrium was mildly dilated. - Right ventricle: Systolic function was mildly reduced.  Cardiac catheterization 08/12/16 by Dr. Beverlyn Roux LAD lesion, 50 %stenosed. Mid LAD lesion, 70 %stenosed.  Dist LAD lesion, 99 %stenosed at Graft Insertion Site.  1st Diag lesion, 90 %stenosed.  LIMA-dLAD graft was visualized by angiography and is normal in caliber and anatomically normal with faint competetive flow  Ost Cx lesion, 55 %stenosed.  2nd Mrg lesion, 100 %stenosed. Lat 2nd Mrg lesion, 90 %stenosed.  Ost 3rd Mrg lesion, 100 %stenosed.  Ost RCA lesion,  30 %stenosed.  Dist RCA lesion, 30 %stenosed.  Ost RPDA lesion, 90 %stenosed.  2nd RPLB lesion, 80 %stenosed.   The patient has new CTO of OM2 & OM3 as well as ostial-proximal 80-90% lesions in both the rPDA & rPL2. The CTO vessels are likely not acute as the Troponin levels are not significantly elevated. The one potential PCI target would be ostial rPDA, but with the distal RCA calcified lesion, this would be complicated & would require jailing of the major Right Post-AV Groove Branch with 3 RPL branches.  In the absence of ongoing, active chest pain, reviewing images with Dr. Irish Lack, we both agreed that the best option is to optimize medical management and volume control as she is essentially immobilized in a wheelchair. If symptoms warrant, with ongoing  active chest pain, we could consider difficult PCI of the RPDA. This would be a relatively small caliber stent  Plan: Return to nursing unit for ongoing care. Restart IV heparin and warfarin. Optimize medical management and volume control.  Laboratory Data:  Chemistry Recent Labs Lab 09/29/16 0510 09/29/16 1235  NA 133* 137  K 5.2* 3.3*  CL 98* 102  CO2 29 27  GLUCOSE 103* 91  BUN 23* 7  CREATININE 6.00* 2.22*  CALCIUM 7.9* 7.8*  GFRNONAA 7* 23*  GFRAA 8* 26*  ANIONGAP 6 8     Recent Labs Lab 09/29/16 1235  ALBUMIN 1.8*   Hematology Recent Labs Lab 09/29/16 0510  WBC 11.8*  RBC 3.64*  HGB 10.7*  HCT 31.7*  MCV 87.1  MCH 29.4  MCHC 33.8  RDW 17.8*  PLT 103*   Cardiac Enzymes Recent Labs Lab 09/28/16 2330 09/29/16 0510 09/29/16 0935  TROPONINI 2.76* 2.47* 2.80*   No results for input(s): TROPIPOC in the last 168 hours.  BNP Recent Labs Lab 09/29/16 0510  BNP 2,546.5*    DDimer No results for input(s): DDIMER in the last 168 hours.  Radiology/Studies:  No results found.  Assessment and Plan:   61 year old female with multiple medical issues including end-stage renal disease on hemodialysis, Ab2 patient, coronary artery disease status post bypass, mechanical aortic valve on Coumadin, with non-ST elevation myocardial infarction in the setting of acute mental status changes, profound weakness and dizziness. Currently does not recall having chest pain.  Non-ST elevation myocardial infarction- likely type 2 infarction from weakness  - Troponin currently 2.7-2.8 down from 5.  - Based upon prior cardiac catheterization as described above, I would continue with medical management. She is on coumadin so no heparin needed.  Since she is not having any active anginal symptoms, I would forego invasive therapies.  - Multiple comorbidities increase likelihood of location during invasive procedure.  - Of course, given her non-ST elevation myocardial infarction,  this does put her at increased risk for cardiovascular morbidity/mortality.   Chronic systolic heart failure  - EF 30% on most recent echocardiogram proximal leg 1 month ago.  - Continue with dialysis for volume control.  - Low-dose carvedilol. May need to hold if BP too low.  - Reduced BP and EF are poor prognostic signs. Daughter aware.   End-stage renal disease  - Per primary nephrology team.   we will follow along.   Signed, Candee Furbish, MD  09/29/2016 5:32 PM

## 2016-09-29 NOTE — Consult Note (Signed)
[facility] Renal Consultation Note    Indication for Consultation:  Management of ESRD/hemodialysis; anemia, hypertension/volume and secondary hyperparathyroidism PCP:  HPI: [name] is a 61 y.o. female with ESRD on hemodialysis MWF at [facility]. PMH significant for Hep C with cirrhosis, liver transplant in 2011, HTN, DM, COPD, CHF, CAD, PAD with bilateral BKA, S/P mechanical AVR on coumadin, CABG 2015, Hx of multiple failed HD accesses. Last HD 09/26/16 ran full treatment, left slightly above EDW.   Patient apparently had syncopal episode on Sunday with altered mental status. She was brought to [facility] for evaluation after being seen at [facility] ED where she was found to have troponin of 5 and started C/O chest pain. She is being worked up by primary for syncope. Cardiology has been consulted.    Patient is currently being seen on hemodialysis. She is awake, alert, oriented X 3. Her story is different. Patient says that she has nausea and vomiting and became so weak, she doesn't remember all that has happened. However after speaking with daughter, daughter says this happened 2 weeks ago and she was actually seen at [facility] long ED. She has no C/O chest pain, SOB. Denies fever, chills, + nausea + vomiting no diarrhea, denies abdominal pain + C/O dizziness.  Says she feels fine now. Says she did have some dizziness but this has passed. She is a baseline mentally. Vital signs are stable and she is tolerating HD well.   Past Medical History:  Diagnosis Date  . Anemia    takes Folic Acid daily  . Anxiety   . Arthritis    "left hand, back" (08/30/2012)  . Asthma   . CAD (coronary artery disease) Jan. 2015   Cath: 20% LAD, 50% D1; s/p LIMA-LAD  . Chest pain 03/06/2015  . CHF (congestive heart failure) ([place]) 05/2016  . Chronic back pain   . Chronic constipation    takes MIralax and Colace daily  . COPD (chronic obstructive pulmonary disease) ([place])   . Depression    takes Cymbalta for "severe" depression  . ESRD on dialysis [facility])    "MWF; [facility]" (08/12/2016)  . GERD (gastroesophageal reflux disease)    takes Omeprazole daily  . Headache    "at least monthly" (08/30/2012)  . Hepatitis C   . History of blood transfusion    "several" (08/30/2012)  . Hypertension    takes Metoprolol and Lisinopril daily, sees Dr Bea Graff  . Hypothyroidism    takes Synthroid daily  . Migraine    "last migraine was in 2013" (08/30/2012)  . Neuromuscular disorder ([place])    carpal tunnel in right hand  . Obesity   . Peripheral vascular disease (HCC) hands and legs  . Pneumonia    "today and several times before" (08/30/2012)  . 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 S/P bilateral BKA (below knee amputation) ([place]) 04/30/2016  . S/P liver transplant ([place])    2011 at [facility] (cirrhosis due to hep C, got hep C from blood transfuion in 1980's per pt))  . SVT (supraventricular tachycardia) ([place]) 06/09/14  . Tobacco abuse   . Type II diabetes mellitus (HCC)    Levemir 2units daily if > 150   Past Surgical History:  Procedure Laterality Date  . ABDOMINAL AORTOGRAM W/LOWER EXTREMITY N/A 07/04/2016   Procedure: Abdominal Aortogram w/Lower Extremity;  Surgeon: Elam Dutch, MD;  Location: [place] CV LAB;  Service: Cardiovascular;  Laterality: N/A;  . AMPUTATION Left  04/24/2016   Procedure: AMPUTATION BELOW KNEE;  Surgeon: Angelia Mould, MD;  Location: [place];  Service: Vascular;  Laterality: Left;  . AMPUTATION Right 07/08/2016   Procedure: AMPUTATION BELOW KNEE;  Surgeon: Angelia Mould, MD;  Location: [place];  Service: Vascular;  Laterality: Right;  . AORTIC VALVE REPLACEMENT N/A 03/15/2013   AVR; Surgeon: Ivin Poot, MD;  Location: [facility] OR; Open Heart Surgery;  42mm[employer] mechanical prosthesis, top hat valve  . ARTERIOVENOUS GRAFT PLACEMENT Left 10/03/10    forearm  . AV FISTULA PLACEMENT  01/29/2011   Procedure: INSERTION OF  ARTERIOVENOUS (AV) GORE-TEX GRAFT ARM;  Surgeon: Elam Dutch, MD;  Location: [facility] OR;  Service: Vascular;  Laterality: Right;  . AV FISTULA PLACEMENT  03/10/2011   Procedure: INSERTION OF ARTERIOVENOUS (AV) GORE-TEX GRAFT THIGH;  Surgeon: Elam Dutch, MD;  Location: [place];  Service: Vascular;  Laterality: Left;  . AV FISTULA PLACEMENT Left 07/21/2016   Procedure: CREATION OF LEFT UPPER ARM ARTERIOVENOUS (AV) FISTULA;  Surgeon: Conrad Gouldsboro, MD;  Location: [place];  Service: Vascular;  Laterality: Left;  . [place] REMOVAL  12/23/2010   Procedure: REMOVAL OF ARTERIOVENOUS GORETEX GRAFT ([place]);  Surgeon: Elam Dutch, MD;  Location: [place];  Service: Vascular;  Laterality: Left;  procedure started @1736 -1852  . CHOLECYSTECTOMY  1993  . CORONARY ANGIOGRAPHY N/A 08/12/2016   Procedure: CORONARY ANGIOGRAPHY (CATH LAB);  Surgeon: Leonie Man, MD;  Location: [place] CV LAB;  Service: Cardiovascular;  Laterality: N/A;  . CORONARY ARTERY BYPASS GRAFT N/A 03/15/2013   Procedure: CORONARY ARTERY BYPASS GRAFTING (CABG) times one using left internal mammary artery.;  Surgeon: Ivin Poot, MD;  Location: [place];  Service: Open Heart Surgery;  Laterality: N/A;  POSS CABG X 1  . CYSTOSCOPY  1990's  . INSERTION OF DIALYSIS CATHETER  12/23/2010   Procedure: INSERTION OF DIALYSIS CATHETER;  Surgeon: Elam Dutch, MD;  Location: [place];  Service: Vascular;  Laterality: Right;  Right Internal Jugular 28cm dialysis catheter insertion procedure time 1701-1720   . INSERTION OF DIALYSIS CATHETER Right 03/11/2016   Procedure: INSERTION OF DIALYSIS CATHETER;  Surgeon: Angelia Mould, MD;  Location: [place];  Service: Vascular;  Laterality: Right;  . INTRAOPERATIVE TRANSESOPHAGEAL ECHOCARDIOGRAM N/A 03/15/2013   Procedure: INTRAOPERATIVE TRANSESOPHAGEAL ECHOCARDIOGRAM;  Surgeon: Ivin Poot, MD;  Location: [place];  Service: Open Heart Surgery;  Laterality: N/A;  . LEFT HEART CATHETERIZATION WITH CORONARY  ANGIOGRAM N/A 07/29/2012   Procedure: LEFT HEART CATHETERIZATION WITH CORONARY ANGIOGRAM;  Surgeon: Troy Sine, MD;  Location: [facility] CATH LAB;  Service: Cardiovascular;  Laterality: N/A;  . LEFT HEART CATHETERIZATION WITH CORONARY ANGIOGRAM N/A 03/10/2013   Procedure: LEFT HEART CATHETERIZATION WITH CORONARY ANGIOGRAM;  Surgeon: Troy Sine, MD;  Location: [facility] CATH LAB;  Service: Cardiovascular;  Laterality: N/A;  . LEFT HEART CATHETERIZATION WITH CORONARY/GRAFT ANGIOGRAM N/A 12/24/2011   Procedure: LEFT HEART CATHETERIZATION WITH Beatrix Fetters;  Surgeon: [name], MD;  Location: [facility] CATH LAB;  Service: Cardiovascular;  Laterality: N/A;  . LEFT HEART CATHETERIZATION WITH CORONARY/GRAFT ANGIOGRAM N/A 12/16/2013   Procedure: LEFT HEART CATHETERIZATION WITH Beatrix Fetters;  Surgeon: Troy Sine, MD;  Location: [facility] CATH LAB;  Service: Cardiovascular;  Laterality: N/A;  . LIGATION ARTERIOVENOUS GORTEX GRAFT Left 03/11/2016   Procedure: LIGATION THIGH ARTERIOVENOUS GORTEX GRAFT;  Surgeon: Angelia Mould, MD;  Location: [place];  Service: Vascular;  Laterality: Left;  . LIGATION OF ARTERIOVENOUS  FISTULA Left 08/11/2016   Procedure: LIGATION OF ARTERIOVENOUS  FISTULA;  Surgeon: Elam Dutch, MD;  Location: [place];  Service: Vascular;  Laterality: Left;  . LIVER TRANSPLANT  10/25/2009   sees Dr Ferol Luz 1 every 6 months, saw last in Dec 2013. [name] Coord [phone]  . PERIPHERAL VASCULAR CATHETERIZATION N/A 11/06/2015   Procedure: Abdominal Aortogram;  Surgeon: Serafina Mitchell, MD;  Location: [place] CV LAB;  Service: Cardiovascular;  Laterality: N/A;  . PERIPHERAL VASCULAR CATHETERIZATION N/A 11/06/2015   Procedure: Lower Extremity Angiography;  Surgeon: Serafina Mitchell, MD;  Location: [place] CV LAB;  Service: Cardiovascular;  Laterality: N/A;  . PERIPHERAL VASCULAR CATHETERIZATION  11/06/2015   Procedure: Peripheral Vascular Atherectomy;  Surgeon: Serafina Mitchell, MD;  Location: [place] CV LAB;  Service: Cardiovascular;;  Left Superficial femoral  . SHUNTOGRAM Left 05/15/2014   Procedure: SHUNTOGRAM;  Surgeon: Conrad Marmaduke, MD;  Location: [facility] CATH LAB;  Service: Cardiovascular;  Laterality: Left;  . SMALL INTESTINE SURGERY  90's  . SPINAL GROWTH RODS  2010   "put 2 metal rods in my back; they had detetriorated" (08/30/2012)  . THROMBECTOMY    . THROMBECTOMY AND REVISION OF ARTERIOVENTOUS (AV) GORETEX  GRAFT Left 03/30/2014  . THROMBECTOMY AND REVISION OF ARTERIOVENTOUS (AV) GORETEX  GRAFT Left 03/30/2014   Procedure: THROMBECTOMY AND REVISION OF ARTERIOVENTOUS (AV) GORETEX  GRAFT;  Surgeon: Conrad Lakeland, MD;  Location: [place];  Service: Vascular;  Laterality: Left;  . TUBAL LIGATION  1990's   Family History  Problem Relation Age of Onset  . Cancer Mother   . Diabetes Mother   . Hypertension Mother   . Stroke Mother   . Cancer Father   . Anesthesia problems Neg Hx   . Hypotension Neg Hx   . Malignant hyperthermia Neg Hx   . Pseudochol deficiency Neg Hx    Social History:  reports that she has been smoking Cigarettes.  She has a 64.50 pack-year smoking history. She has never used smokeless tobacco. She reports that she does not drink alcohol or use drugs. Allergies  Allergen Reactions  . Acetaminophen Other (See Comments)    Liver transplant recipient   . Codeine Itching  . Mirtazapine Other (See Comments)    hallucination   Prior to Admission medications   Medication Sig Start Date End Date Taking? Authorizing Provider  ALPRAZolam Duanne Moron) 0.5 MG tablet Take 0.5 mg by mouth 3 (three) times daily as needed for anxiety.  08/26/16  Yes [provider]  aspirin EC 81 MG tablet Take 81 mg by mouth daily at 12 noon.    Yes [provider]  carvedilol (COREG) 3.125 MG tablet Take 1 tablet (3.125 mg total) by mouth 2 (two) times daily with a meal. 08/13/16  Yes Rai, Ripudeep K, MD  levothyroxine (SYNTHROID, LEVOTHROID) 175  MCG tablet Take 1 tablet (175 mcg total) by mouth daily at 12 noon. 07/24/16  Yes Angiulli, Lavon Paganini, PA-C  metoCLOPramide (REGLAN) 5 MG tablet Take 1 tablet (5 mg total) by mouth 2 (two) times daily before a meal. Decrease to once a day before breakfast after a couple of weeks. 07/24/16  Yes Angiulli, Lavon Paganini, PA-C  mirtazapine (REMERON) 15 MG tablet Take 15 mg by mouth daily. 08/17/16  Yes [provider]  nitroGLYCERIN (NITROSTAT) 0.4 MG SL tablet Place 1 tablet (0.4 mg total) under the tongue every 5 (five) minutes as needed for chest pain. 03/20/14  Yes Elgergawy,  Silver Huguenin, MD  ondansetron (ZOFRAN ODT) 8 MG disintegrating tablet 8mg  ODT q4 hours prn nausea Patient taking differently: Take 8 mg by mouth every 4 (four) hours as needed for nausea or vomiting.  09/18/16  Yes Delo, Nathaneil Canary, MD  oxyCODONE (OXY IR/ROXICODONE) 5 MG immediate release tablet Take 1 tablet (5 mg total) by mouth every 4 (four) hours as needed for severe pain. 09/18/16  Yes Delo, Nathaneil Canary, MD  tacrolimus (PROGRAF) 1 MG capsule Take 2 mg by mouth 2 (two) times daily.    Yes [provider]  traZODone (DESYREL) 50 MG tablet Take 50 mg by mouth at bedtime.   Yes [provider]  warfarin (COUMADIN) 2 MG tablet Take 1-2 tablets by mouth daily as directed by coumadin clinic Patient taking differently: Take 2 mg by mouth at bedtime.  09/12/16  Yes Troy Sine, MD  Amino Acids-Protein Hydrolys (FEEDING SUPPLEMENT, PRO-STAT SUGAR FREE 64,) LIQD Take 30 mLs by mouth 2 (two) times daily. Patient not taking: Reported on 09/18/2016 08/28/16   Lavina Hamman, MD  calcitRIOL (ROCALTROL) 0.5 MCG capsule Take 1 capsule (0.5 mcg total) by mouth every Monday, Wednesday, and Friday with hemodialysis. Patient not taking: Reported on 09/29/2016 07/25/16   Angiulli, Lavon Paganini, PA-C  calcium acetate (PHOSLO) 667 MG capsule Take 1 capsule (667 mg total) by mouth 3 (three) times daily with meals. Patient not taking: Reported on  09/29/2016 07/24/16   Angiulli, Lavon Paganini, PA-C  feeding supplement (BOOST / RESOURCE BREEZE) LIQD Take 1 Container by mouth 3 (three) times daily between meals. 04/25/16   Reyne Dumas, MD  methocarbamol (ROBAXIN) 500 MG tablet Take 0.5 tablets (250 mg total) by mouth every 6 (six) hours as needed for muscle spasms. 07/24/16   Angiulli, Lavon Paganini, PA-C  multivitamin (RENA-VIT) TABS tablet Take 1 tablet by mouth at bedtime. Patient not taking: Reported on 08/27/2016 05/20/16   Bary Leriche, PA-C   Current Facility-Administered Medications  Medication Dose Route Frequency Provider Last Rate Last Dose  . 0.9 %  sodium chloride infusion  100 mL Intravenous PRN Valentina Gu, NP      . 0.9 %  sodium chloride infusion  100 mL Intravenous PRN Valentina Gu, NP      . ALPRAZolam Duanne Moron) tablet 0.25 mg  0.25 mg Oral TID PRN Karmen Bongo, MD      . alteplase (CATHFLO ACTIVASE) injection 2 mg  2 mg Intracatheter Once PRN Valentina Gu, NP      . aspirin EC tablet 81 mg  81 mg Oral Q1200 Karmen Bongo, MD      . bisacodyl (DULCOLAX) EC tablet 5 mg  5 mg Oral Daily PRN Karmen Bongo, MD      . calcium acetate (PHOSLO) capsule 667 mg  667 mg Oral TID WC Karmen Bongo, MD   667 mg at 09/29/16 4098  . calcium carbonate (dosed in mg elemental calcium) suspension 500 mg of elemental calcium  500 mg of elemental calcium Oral Q6H PRN Karmen Bongo, MD      . camphor-menthol [facility]) lotion 1 application  1 application Topical J1B PRN Karmen Bongo, MD       And  . hydrOXYzine (ATARAX/VISTARIL) tablet 25 mg  25 mg Oral Q8H PRN Karmen Bongo, MD      . carvedilol (COREG) tablet 3.125 mg  3.125 mg Oral BID WC Valentina Gu, NP      . docusate sodium (COLACE) capsule 100 mg  100  mg Oral BID Karmen Bongo, MD   100 mg at 09/29/16 7793  . docusate sodium (ENEMEEZ) enema 283 mg  1 enema Rectal PRN Karmen Bongo, MD      . doxercalciferol (HECTOROL) capsule 0.5 mcg  0.5 mcg Oral Q  M,W,F-HD Valentina Gu, NP      . feeding supplement (NEPRO CARB STEADY) liquid 237 mL  237 mL Oral TID PRN Karmen Bongo, MD      . heparin injection 1,000 Units  1,000 Units Dialysis PRN Valentina Gu, NP      . levothyroxine (SYNTHROID, LEVOTHROID) tablet 200 mcg  200 mcg Oral QAC breakfast Karmen Bongo, MD   200 mcg at 09/29/16 0831  . lidocaine (PF) (XYLOCAINE) 1 % injection 5 mL  5 mL Intradermal PRN Valentina Gu, NP      . lidocaine-prilocaine (EMLA) cream 1 application  1 application Topical PRN Valentina Gu, NP      . methocarbamol (ROBAXIN) tablet 250 mg  250 mg Oral Q6H PRN Karmen Bongo, MD      . metoCLOPramide (REGLAN) tablet 5 mg  5 mg Oral BID Meliton Rattan, MD   5 mg at 09/29/16 9030  . mirtazapine (REMERON) tablet 15 mg  15 mg Oral Daily Karmen Bongo, MD   15 mg at 09/29/16 0831  . morphine 2 MG/ML injection 2 mg  2 mg Intravenous Q2H PRN Karmen Bongo, MD      . multivitamin (RENA-VIT) tablet 1 tablet  1 tablet Oral Ivery Quale, MD   1 tablet at 09/29/16 0005  . ondansetron (ZOFRAN) tablet 4 mg  4 mg Oral Q6H PRN Karmen Bongo, MD       Or  . ondansetron [facility]) injection 4 mg  4 mg Intravenous Q6H PRN Karmen Bongo, MD   4 mg at 09/29/16 0019  . oxyCODONE (Oxy IR/ROXICODONE) immediate release tablet 5 mg  5 mg Oral Q4H PRN Karmen Bongo, MD   5 mg at 09/29/16 0019  . pentafluoroprop-tetrafluoroeth (GEBAUERS) aerosol 1 application  1 application Topical PRN Valentina Gu, NP      . pneumococcal 23 valent vaccine (PNU-IMMUNE) injection 0.5 mL  0.5 mL Intramuscular Prior to discharge Theodis Blaze, MD      . polyethylene glycol (MIRALAX / Floria Raveling) packet 17 g  17 g Oral Daily PRN Karmen Bongo, MD      . sodium chloride flush (NS) 0.9 % injection 3 mL  3 mL Intravenous Q12H Karmen Bongo, MD   3 mL at 09/29/16 0923  . sorbitol 70 % solution 30 mL  30 mL Oral PRN Karmen Bongo, MD      . tacrolimus  (PROGRAF) capsule 2 mg  2 mg Oral BID Karmen Bongo, MD   2 mg at 09/29/16 0943  . warfarin (COUMADIN) tablet 2 mg  2 mg Oral q1800 Erenest Blank, RPH      . Warfarin - Pharmacist Dosing Inpatient   Does not apply q1800 Erenest Blank, RPH      . zolpidem (AMBIEN) tablet 5 mg  5 mg Oral QHS PRN Karmen Bongo, MD       Labs: Basic Metabolic Panel:  Recent Labs Lab 09/29/16 0510  NA 133*  K 5.2*  CL 98*  CO2 29  GLUCOSE 103*  BUN 23*  CREATININE 6.00*  CALCIUM 7.9*    CBC:  Recent Labs Lab 09/29/16 0510  WBC 11.8*  HGB 10.7*  HCT 31.7*  MCV 87.1  PLT 103*   Cardiac Enzymes:  Recent Labs Lab 09/28/16 2330 09/29/16 0510 09/29/16 0935  TROPONINI 2.76* 2.47* 2.80*   CBG:  Recent Labs Lab 09/29/16 0754  GLUCAP 83   I ROS: As per HPI otherwise negative.   Physical Exam: Vitals:   09/29/16 1105 09/29/16 1130 09/29/16 1200 09/29/16 1230  BP: (!) 93/28 (!) 98/24 (!) 99/47 (!) 87/37  Pulse: 96 100 98 94  Resp:      Temp:      TempSrc:      SpO2:      Weight:         General: Chronically ill appearing AA female in NAD.  Head: Normocephalic, atraumatic, sclera non-icteric, mucus membranes are moist Neck: Supple. JVD not elevated. Lungs: Clear bilaterally to auscultation without wheezes, rales, or rhonchi. Breathing is unlabored. Heart: RRR with S1 S2. No murmurs, rubs, or gallops appreciated. Abdomen: Soft, non-tender, non-distended with normoactive bowel sounds. No rebound/guarding. No obvious abdominal masses. M-S:  Strength and tone appear normal for age. Lower extremities:Bilateral BKA, no stump edema.  Neuro: Alert and oriented X 3. Moves all extremities spontaneously. Psych:  Responds to questions appropriately with a normal affect. Dialysis Access: RIJ TDC blood lines connected.   Dialysis Orders: [place] MWF 3 hr 45 min 180 NRe 450/1.5 44 kg 3.0 K/ 2.25 Ca Linear Na RIJ TDC -No Heparin -No ESA/Fe -Calcitriol 0.5 mcg PO TIW (Last PTH  342)   BMD meds: Calcium acetate 667 mg 2 caps PO TID AC   Assessment/Plan: 1.  Acute metabolic encephalopathy/Syncope: Appears at baseline mentally however confused about sequence of events that brought her to hospital. Per primary. MRI pending.  2.  ESRD -  MWF. HD today on schedule. K+ 5.2. Usually uses 3.0 K bath but will use 2.0 K bath today. No heparin.  3.  Hypertension/volume  - BP on soft side but stable. UFG 2.5 liters. Cont Coreg 3.125 mg PO BID. Hold prior to HD.  4.  Anemia  - HGB 10.7. No OP ESA. Follow HGB.  5.  Metabolic bone disease -  Continue binders, VDRA.  6.  Nutrition -Albumin 1.8 Renal Carb mod diet. Add prostat, renal vits 7. DM: per primary 8. H/O mechanical AVR: Cont Coumadin per pharmacy 9. Hypothyroidism: per primary 10. H/O liver transplant. Continue tacrolimus per primary.  11. FTT in adult - 10th admission this year 63. DNR  Jimmye Norman. Owens Shark, NP-C 09/29/2016, 1:08 PM  [employer] [phone]  Pt seen, examined and agree w A/P as above. Pt w/ multiple chronic medical conditions and this is her 10th admission this year.  [name] MD [name] pager [phone]   09/29/2016, 3:36 PM

## 2016-09-29 NOTE — Consult Note (Signed)
   [facility] Inpatient Consult   09/29/2016  [name] 1955-03-20 [id number]    Patient screened for [facility] Management services.  Spoke with patient in the recent past who confirms she no longer goes to a [facility] Provider.   Chart reviewed and noted Primary Care MD listed, Dr. Clovia Cuff, who is not at [facility] Provider.  Went to bedside x2 to confirm patient's Primary Care MD. However, she was off unit. Will come back at later time to confirm Primary Care MD.  [name], MSN-Ed, RN,BSN [facility] Liaison [phone]

## 2016-09-29 NOTE — Plan of Care (Signed)
Problem: Safety: Goal: Ability to remain free from injury will improve Outcome: Progressing Reviewed safety with family and patient and also reviewed bed alarm and the importance of using the call bell or white phone and board with RN/NT phone numbers and family verbalized understanding.

## 2016-09-30 ENCOUNTER — Inpatient Hospital Stay (HOSPITAL_COMMUNITY): Payer: Medicare Other

## 2016-09-30 DIAGNOSIS — I342 Nonrheumatic mitral (valve) stenosis: Secondary | ICD-10-CM

## 2016-09-30 LAB — RENAL FUNCTION PANEL
ALBUMIN: 1.5 g/dL — AB (ref 3.5–5.0)
Anion gap: 6 (ref 5–15)
BUN: 13 mg/dL (ref 6–20)
CALCIUM: 8.1 mg/dL — AB (ref 8.9–10.3)
CO2: 29 mmol/L (ref 22–32)
CREATININE: 3.6 mg/dL — AB (ref 0.44–1.00)
Chloride: 98 mmol/L — ABNORMAL LOW (ref 101–111)
GFR, EST AFRICAN AMERICAN: 15 mL/min — AB (ref >60)
GFR, EST NON AFRICAN AMERICAN: 13 mL/min — AB (ref >60)
Glucose, Bld: 158 mg/dL — ABNORMAL HIGH (ref 65–99)
PHOSPHORUS: 4.4 mg/dL (ref 2.5–4.6)
Potassium: 3.9 mmol/L (ref 3.5–5.1)
SODIUM: 133 mmol/L — AB (ref 135–145)

## 2016-09-30 LAB — ECHOCARDIOGRAM COMPLETE: WEIGHTICAEL: 1516.76 [oz_av]

## 2016-09-30 LAB — GLUCOSE, CAPILLARY
GLUCOSE-CAPILLARY: 119 mg/dL — AB (ref 65–99)
GLUCOSE-CAPILLARY: 165 mg/dL — AB (ref 65–99)
Glucose-Capillary: 214 mg/dL — ABNORMAL HIGH (ref 65–99)
Glucose-Capillary: 163 mg/dL — ABNORMAL HIGH (ref 65–99)

## 2016-09-30 LAB — PROTIME-INR
INR: 5.25
Prothrombin Time: 49.7 seconds — ABNORMAL HIGH (ref 11.4–15.2)

## 2016-09-30 MED ORDER — MIDODRINE HCL 5 MG PO TABS
10.0000 mg | ORAL_TABLET | Freq: Three times a day (TID) | ORAL | Status: DC
  Administered 2016-09-30 – 2016-10-02 (×6): 10 mg via ORAL
  Filled 2016-09-30 (×5): qty 2

## 2016-09-30 MED ORDER — BOOST / RESOURCE BREEZE PO LIQD
1.0000 | Freq: Three times a day (TID) | ORAL | Status: DC
  Administered 2016-09-30 – 2016-10-02 (×6): 1 via ORAL

## 2016-09-30 NOTE — Consult Note (Addendum)
   [facility] Inpatient Consult   09/30/2016  [name] 16-Jun-1955 [id number]   [facility] Care Management follow up.  Was able to speak with Ms. Vertell Limber and daughter at bedside to confirm Primary Care Provider.   Ms. Christensen' daughter confirms that Dr. Clovia Cuff is patient's Primary Care Provider. States Dr. Daphene Jaeger does home visits.   Discussed that Dr. Daphene Jaeger is not a [facility] Provider. Daughter and patient express understanding.  Will make [place] Management office aware of above.  Will make inpatient RNCM aware.  [name], MSN-Ed, RN,BSN [facility] Liaison [phone]

## 2016-09-30 NOTE — Consult Note (Addendum)
   [facility] Inpatient Consult   09/30/2016  [name] 1955-09-14 [id number]    Received call from [place] Management Assistant Director stating patient is indeed eligible for [facility] Care Management due to [name] going to [facility]. Referral was received from [facility] for [facility] Management services.   Went back to patient's room. Spoke with patient and daughter [name]). Explained referral was received. Explained [place] Management program services. Written consent obtained for [place] Management services.  Confirmed that [name] should be contacted for post hospital discharge call. [name]'s number is [phone]. Confirmed Dr. Justin Mend is her Nephrologist.   [place] Management packet provided. Explained that they will receive post discharge calls and will be evaluated for monthly home visits.   Daughter lives with [name]. Denies having concerns with medications or with transportation. States her mother comes into the hospital so frequently because of "fluid".  Left voicemail for covering inpatient RNCM to make aware that [facility] will follow after all.   [name], MSN-Ed, RN,BSN [facility] Liaison [phone]

## 2016-09-30 NOTE — Progress Notes (Signed)
Text paged [facility] re: elevated INR of 5.25 and spoke with Dr Myna Hidalgo, will hold her Coumadin and monitor her VS and watch for bleeding, no other orders received.

## 2016-09-30 NOTE — Progress Notes (Signed)
Initial Nutrition Assessment  DOCUMENTATION CODES:   Severe malnutrition in context of chronic illness  INTERVENTION:   -Boost Breeze po TID, each supplement provides 250 kcal and 9 grams of protein -Continue rena-vit daily -Continue 30 ml Prostat BID, each supplement provides 100 kcals an 15 grams protein -Liberalized diet to regular; took verbal order from Dr. Doyle Askew  NUTRITION DIAGNOSIS:   Malnutrition (Severe) related to chronic illness (ESRD on HD) as evidenced by percent weight loss, moderate depletion of body fat, moderate depletions of muscle mass, severe depletion of muscle mass, energy intake < 75% for > or equal to 1 month.  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 Daughter provided most of the history on admission as pt was lethargic. Pt was transferred from [facility] for further evaluation and concern with troponin ~5.   Pt admitted with AMS.   Pt with flat affect; per pt daughter, pt is agitated because she was to get up despite being on bedrest. Pt shares her appetite is very poor- she typically consumes 1 meal daily (either mashed potatoes with gravy or a hot dog). Pt reports she "hasn't eaten anything" for two weeks. Pt daughter adds that pt often will consume an Ensure Clear supplement BID as a meal replacement (reported intake approximately 385 kcals and 22 grams protein- 30% of estimate kcal needs and 31% of estimated protein needs). Pt is a very choosy eater; per pt daughter, pt craves sweets- pt consumed 50% of a blueberry muffin and a few bites of cake today. She has not been eating her meal trays related to  lack of appetite and frustrations regarding limited options on renal/carb modified diet.   Wt hx reviewed; noted pt has experienced a 19.6% wt loss over the past 3 months, which is significant for time frame.   Nutrition-Focused physical exam completed. Findings are moderate fat depletion, moderate to severe muscle depletion, and no edema.   Pt requesting diet to be liberalized to a regular diet; she believes that she will eat more due to increased food selections. Given pt's malnutrition an prolonged poor oral intake, this RD feels that this is a reasonable request. Text paged Dr. Olen Pel and received verbal order to liberalize diet to regular. Discussed with pt importance of good meal and supplement intake to promote healin  Labs reviewed: Na: 133, CBGS: 119-165.   Diet Order:  Diet regular Room service appropriate? Yes; Fluid consistency: Thin  Skin:  Reviewed, no issues  Last BM:  09/29/16  Height:   Ht Readings from Last 1 Encounters:  08/27/16 5\' 4"  (1.626 m)    Weight:   Wt Readings from Last 1 Encounters:  09/30/16 94 lb 12.8 oz (43 kg)    Ideal Body Weight:  47.5 kg  BMI:  Body mass index is 16.27 kg/m. Adjusted BMI: 18.7  Estimated Nutritional Needs:   Kcal:  1300-1500  Protein:  70-85 grams  Fluid:  1000 ml + UOP  EDUCATION NEEDS:   Education needs addressed  Crawford Tamura A. Jimmye Norman, RD, LDN, CDE Pager: [phone] After hours Pager: [phone]

## 2016-09-30 NOTE — Progress Notes (Addendum)
ANTICOAGULATION CONSULT NOTE  Pharmacy Consult for Warfarin  Indication: Mechanical AVR  Allergies  Allergen Reactions  . Acetaminophen Other (See Comments)    Liver transplant recipient   . Codeine Itching  . Mirtazapine Other (See Comments)    hallucination    Patient Measurements: Height:  (does not know new height since bilateral BKA's) Weight: 94 lb 12.8 oz (43 kg)  Vital Signs: Temp: 98.6 F (37 C) (08/21 0755) Temp Source: Oral (08/21 0755) BP: 108/68 (08/21 0755) Pulse Rate: 88 (08/21 0755)  Labs:  Recent Labs  09/28/16 2330 09/29/16 0510 09/29/16 0935 09/29/16 1235 09/29/16 1753 09/30/16 0430  HGB  --  10.7*  --   --  11.4*  --   HCT  --  31.7*  --   --  34.0*  --   PLT  --  103*  --   --  95*  --   LABPROT 35.8*  --   --   --   --  49.7*  INR 3.48  --   --   --   --  5.25*  CREATININE  --  6.00*  --  2.22*  --  3.60*  TROPONINI 2.76* 2.47* 2.80*  --   --   --     Estimated Creatinine Clearance: 11 mL/min (A) (by C-G formula based on SCr of 3.6 mg/dL (H)).   Medical History: Past Medical History:  Diagnosis Date  . Anemia    takes Folic Acid daily  . Anxiety   . Arthritis    "left hand, back" (08/30/2012)  . Asthma   . CAD (coronary artery disease) Jan. 2015   Cath: 20% LAD, 50% D1; s/p LIMA-LAD  . Chest pain 03/06/2015  . CHF (congestive heart failure) ([place]) 05/2016  . Chronic back pain   . Chronic constipation    takes MIralax and Colace daily  . COPD (chronic obstructive pulmonary disease) ([place])   . Depression    takes Cymbalta for "severe" depression  . ESRD on dialysis [facility])    "MWF; [facility]" (08/12/2016)  . GERD (gastroesophageal reflux disease)    takes Omeprazole daily  . Headache    "at least monthly" (08/30/2012)  . Hepatitis C   . History of blood transfusion    "several" (08/30/2012)  . Hypertension    takes Metoprolol and Lisinopril daily, sees Dr Bea Graff  . Hypothyroidism    takes Synthroid daily  . Migraine    "last  migraine was in 2013" (08/30/2012)  . Neuromuscular disorder ([place])    carpal tunnel in right hand  . Obesity   . Peripheral vascular disease (HCC) hands and legs  . Pneumonia    "today and several times before" (08/30/2012)  . 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 S/P bilateral BKA (below knee amputation) ([place]) 04/30/2016  . S/P liver transplant ([place])    2011 at [facility] (cirrhosis due to hep C, got hep C from blood transfuion in 1980's per pt))  . SVT (supraventricular tachycardia) ([place]) 06/09/14  . Tobacco abuse   . Type II diabetes mellitus (HCC)    Levemir 2units daily if > 150    Assessment: 61 y/o F transfer from [facility] with dizziness/chest pain, on warfarin PTA for mechanical AVR, INR therapeutic on admit at 3.48 (goal 2.5-3.5). Pharmacy consulted to dose inpatient, and home dose of 2mg  daily was resumed. [name] INR jumped from 3.48 to 5.25 after 1 dose last night. Hg low stable, plt decreased to 95.  No bleed documented.  PTA dose: 2mg  daily (last outpt [facility] clinic note 09/23/16) - last dose pta "past week"  Noted, INR has been high at last 2 visits at [facility] clinic but asked to hold x 2 days and resume 2mg  daily both times - may require lower dose at d/c.  Goal of Therapy:  INR 2.5-3.5 Monitor platelets by anticoagulation protocol: Yes   Plan:  Hold warfarin tonight with large INR jump Daily INR Monitor CBC, s/sx bleeding  Elicia Lamp, PharmD, BCPS Clinical Pharmacist Rx Phone # for today: [phone] After 3:30PM, please call Main Rx: [phone] 09/30/2016 8:49 AM

## 2016-09-30 NOTE — Progress Notes (Signed)
Progress Note  Patient Name: [name] Date of Encounter: 09/30/2016  Primary Cardiologist: Dr. [name]  Subjective   [name] says she is feeling better today than she was feeling yesterday. Her energy is improved. No pain anywhere or feelings of SOB or palptiations.   Inpatient Medications    Scheduled Meds: . aspirin EC  81 mg Oral Q1200  . calcium acetate  1,334 mg Oral TID WC  . carvedilol  3.125 mg Oral BID WC  . docusate sodium  100 mg Oral BID  . doxercalciferol  0.5 mcg Oral Q M,W,F-HD  . feeding supplement (PRO-STAT SUGAR FREE 64)  30 mL Oral BID  . levothyroxine  200 mcg Oral QAC breakfast  . metoCLOPramide  5 mg Oral BID AC  . midodrine  5 mg Oral BID WC  . mirtazapine  15 mg Oral Daily  . multivitamin  1 tablet Oral QHS  . sodium chloride flush  3 mL Intravenous Q12H  . tacrolimus  2 mg Oral BID  . warfarin  2 mg Oral q1800  . Warfarin - Pharmacist Dosing Inpatient   Does not apply q1800   Continuous Infusions: . sodium chloride    . sodium chloride 100 mL (09/29/16 1930)   PRN Meds: sodium chloride, sodium chloride, ALPRAZolam, alteplase, bisacodyl, calcium carbonate (dosed in mg elemental calcium), camphor-menthol **AND** hydrOXYzine, docusate sodium, feeding supplement (NEPRO CARB STEADY), heparin, lidocaine (PF), lidocaine-prilocaine, methocarbamol, morphine injection, nitroGLYCERIN, ondansetron **OR** ondansetron (ZOFRAN) IV, oxyCODONE, pentafluoroprop-tetrafluoroeth, pneumococcal 23 valent vaccine, polyethylene glycol, sorbitol, zolpidem   Vital Signs    Vitals:   09/30/16 0250 09/30/16 0350 09/30/16 0400 09/30/16 0755  BP: 118/68 109/68 106/65 108/68  Pulse: 91 89 88 88  Resp: 16 19 15 18   Temp:   99.1 F (37.3 C) 98.6 F (37 C)  TempSrc:   Oral Oral  SpO2: 100% 100% 100% 100%  Weight:   94 lb 12.8 oz (43 kg)     Intake/Output Summary (Last 24 hours) at 09/30/16 0829 Last data filed at 09/30/16 0400  Gross per 24 hour  Intake               570 ml  Output             1540 ml  Net             -970 ml   Filed Weights   09/29/16 1022 09/29/16 1407 09/30/16 0400  Weight: 103 lb 6.3 oz (46.9 kg) 100 lb 1.4 oz (45.4 kg) 94 lb 12.8 oz (43 kg)    Telemetry    Sinus rhythm with occasional PVCs - Personally Reviewed   Physical Exam   GEN: elderly and frail female HEENT: normal  Neck: no JVD, carotid bruits, or masses Cardiac: RRR. Mechanical S2, no murmurs Respiratory: clear to auscultation bilaterally, normal work of breathing GI: soft, nontender, nondistended, + BS MS: bilateral BTK amputations Skin: warm and dry, no rash Neuro: Alert and Oriented x 3, Strength and sensation are intact Psych:   Full affect  Labs    Chemistry Recent Labs Lab 09/29/16 0510 09/29/16 1235 09/30/16 0430  NA 133* 137 133*  K 5.2* 3.3* 3.9  CL 98* 102 98*  CO2 29 27 29   GLUCOSE 103* 91 158*  BUN 23* 7 13  CREATININE 6.00* 2.22* 3.60*  CALCIUM 7.9* 7.8* 8.1*  ALBUMIN  --  1.8* 1.5*  GFRNONAA 7* 23* 13*  GFRAA 8* 26* 15*  ANIONGAP 6 8 6  Hematology Recent Labs Lab 09/29/16 0510 09/29/16 1753  WBC 11.8* 11.9*  RBC 3.64* 3.92  HGB 10.7* 11.4*  HCT 31.7* 34.0*  MCV 87.1 86.7  MCH 29.4 29.1  MCHC 33.8 33.5  RDW 17.8* 17.4*  PLT 103* 95*    Cardiac Enzymes Recent Labs Lab 09/28/16 2330 09/29/16 0510 09/29/16 0935  TROPONINI 2.76* 2.47* 2.80*   No results for input(s): TROPIPOC in the last 168 hours.   BNP Recent Labs Lab 09/29/16 0510  BNP 2,546.5*     DDimer No results for input(s): DDIMER in the last 168 hours.   Radiology    No results found.  Cardiac Studies   Echocardiogram 08/11/16: - Left ventricle: LVEF is depressed at approximately 30% with hypokdinesis of the anterior, apical, lateral wallsl Akinesis of the mid/distal inferior/inferoseptal walls. These findings are new compared to echo from Jan 2018 The cavity size was normal. Wall thickness was normal. - Aortic valve: AV  prosthesis is present Peak and mean gradients through the valve are 19 and 11 mm Hg respectively . There was mild regurgitation. - Mitral valve: Calcified annulus. There was mild regurgitation. - Left atrium: The atrium was mildly dilated. - Right ventricle: Systolic function was mildly reduced.  Cardiac catheterization 08/12/16 by Dr. Beverlyn Roux LAD lesion, 50 %stenosed. Mid LAD lesion, 70 %stenosed.  Dist LAD lesion, 99 %stenosed at Graft Insertion Site.  1st Diag lesion, 90 %stenosed.  LIMA-dLAD graft was visualized by angiography and is normal in caliber and anatomically normal with faint competetive flow  Ost Cx lesion, 55 %stenosed.  2nd Mrg lesion, 100 %stenosed. Lat 2nd Mrg lesion, 90 %stenosed.  Ost 3rd Mrg lesion, 100 %stenosed.  Ost RCA lesion, 30 %stenosed.  Dist RCA lesion, 30 %stenosed.  Ost RPDA lesion, 90 %stenosed.  2nd RPLB lesion, 80 %stenosed.  The patient has new CTO of OM2 & OM3 as well as ostial-proximal 80-90% lesions in both the rPDA & rPL2. The CTO vessels are likely not acute as the Troponin levels are not significantly elevated. The one potential PCI target would be ostial rPDA, but with the distal RCA calcified lesion, this would be complicated & would require jailing of the major Right Post-AV Groove Branch with 3 RPL branches.  In the absence of ongoing, active chest pain, reviewing images with Dr. Irish Lack, we both agreed that the best option is to optimize medical management and volume control as she is essentially immobilized in a wheelchair. If symptoms warrant, with ongoing active chest pain, we could consider difficult PCI of the RPDA. This would be a relatively small caliber stent  Plan: Return to nursing unit for ongoing care. Restart IV heparin and warfarin. Optimize medical management and volume control  Patient Profile   [name] is a 61 y.o. female with a hx of ESRD who is being seen for the evaluation of NSTEMI at  the request of Dr. Doyle Askew.  Assessment & Plan     Non-T elevation myocardial infarction likely type 2 infarction from weakness. - Troponin has tended down from the peak of 5, 2.7-2.8 (8/20) - Recommendation based on prior catheterization is for medical management.  - On Coumadin, has not had any recurrence of chest pains, AMS or acute weakness. - She is at an increased risk for cardiovascular morbidity and mortality given her recent NSTEMI - Multiple co morbidities  Chronic systolic heart failure - EF 30% on echo from 1 month ago -  Fluid volume to be controlled with dialysis -  She has been on low dose carvedilol, blood pressure readings from yesterday evening were very low so this was not given yesterday but she has received a dose this AM.  - Reduced BP and EF, poor prognostic signs  Supertherapeutic INR - INR 5.25, holding Coumadin  ESRD - per nephrology  Signed, Linus Mako, PA-C  09/30/2016, 8:29 AM    Personally seen and examined. Agree with above.  Feels better, no angina, no SOB Alert, amputation noted, CTAB, RRR, sharp S2 click  Type 2 MI  - continue with med mgt as above  - coumadin  - trying to use Bb but BP may preclude use.   Mechanical AV  - INR 2-3  - holding because INR was elevated 5  ESRD  - HD   Dilated cardiomyopathy  - EF 30%  - fluid control via HD  Candee Furbish, MD

## 2016-09-30 NOTE — Progress Notes (Signed)
Updated report received in patient's room via [place] RN using SBAR format, reviewed events of the day and any new orders, assumed care of patient.

## 2016-09-30 NOTE — Progress Notes (Addendum)
[facility] Progress Note  Subjective: feeling much better, lots of questions  Vitals:   09/30/16 0400 09/30/16 0755 09/30/16 1123 09/30/16 1303  BP: 106/65 108/68 114/67 (!) 95/57  Pulse: 88 88 82 79  Resp: 15 18 16 14   Temp: 99.1 F (37.3 C) 98.6 F (37 C) 98 F (36.7 C)   TempSrc: Oral Oral Oral   SpO2: 100% 100% 100% 100%  Weight: 43 kg (94 lb 12.8 oz)       Inpatient medications: . aspirin EC  81 mg Oral Q1200  . calcium acetate  1,334 mg Oral TID WC  . carvedilol  3.125 mg Oral BID WC  . docusate sodium  100 mg Oral BID  . doxercalciferol  0.5 mcg Oral Q M,W,F-HD  . feeding supplement  1 Container Oral TID BM  . feeding supplement (PRO-STAT SUGAR FREE 64)  30 mL Oral BID  . levothyroxine  200 mcg Oral QAC breakfast  . metoCLOPramide  5 mg Oral BID AC  . midodrine  5 mg Oral BID WC  . mirtazapine  15 mg Oral Daily  . multivitamin  1 tablet Oral QHS  . sodium chloride flush  3 mL Intravenous Q12H  . tacrolimus  2 mg Oral BID  . Warfarin - Pharmacist Dosing Inpatient   Does not apply q1800   . sodium chloride    . sodium chloride 100 mL (09/29/16 1930)   sodium chloride, sodium chloride, ALPRAZolam, alteplase, bisacodyl, calcium carbonate (dosed in mg elemental calcium), camphor-menthol **AND** hydrOXYzine, docusate sodium, feeding supplement (NEPRO CARB STEADY), heparin, lidocaine (PF), lidocaine-prilocaine, methocarbamol, morphine injection, nitroGLYCERIN, ondansetron **OR** ondansetron (ZOFRAN) IV, oxyCODONE, pentafluoroprop-tetrafluoroeth, pneumococcal 23 valent vaccine, polyethylene glycol, sorbitol, zolpidem  Exam: Alert, no distress, chron ill appearing No jvd Chest clear bilat RRR no mrg Abd soft ntnd Ext bilat BKA, no edema NF, Ox 3 R IJ TDC, no drainage  CXR not done  Dialysis: MWF [name] 3h 22min  44kg  3K/2.25 bath  R IJ TDC  Hep none -No ESA/Fe -Calcitriol 0.5 mcg PO TIW (Last PTH 342)   BMD meds: Calcium acetate 667 mg 2 caps PO  TID AC      Impression: 1  Presyncope/ hypotension - prob due to sig heart failure issues. No sig vol overload or vol depletion, at her dry wt.  AMS resolved and BP's soft. Will add midodrine for chronic hypotension at 10 mg bid.  2  ESRD on HD MWF 3  Vol is at dry wt 4  Anemia Hb 10.7, no esa 5  MBD / CKD - no chgs for now 6  Liver transplant - cont meds 7  FTT in adult - 10 th admission this year 8  DNR 9  Hx mech AVR 2015 10  CAD/ hx CABG 2015- severe w subsequent CAD/ MI recently , heart cath done w/o intervention, cardiology planning medical Rx only.  Per cards on July admit cont low dose coreg and asa. BP's too low for ARB or hydral/ nitrate combination. Not ICD candidate given ESRD.   11  Chron syst CHF - EF 30%  Plan - HD tomorrow, UF to dry wt   [name] MD [facility] pager [phone]   09/30/2016, 2:12 PM    Recent Labs Lab 09/29/16 0510 09/29/16 1235 09/30/16 0430  NA 133* 137 133*  K 5.2* 3.3* 3.9  CL 98* 102 98*  CO2 29 27 29   GLUCOSE 103* 91 158*  BUN 23* 7 13  CREATININE 6.00* 2.22* 3.60*  CALCIUM 7.9* 7.8* 8.1*  PHOS  --  2.2* 4.4    Recent Labs Lab 09/29/16 1235 09/30/16 0430  ALBUMIN 1.8* 1.5*    Recent Labs Lab 09/29/16 0510 09/29/16 1753  WBC 11.8* 11.9*  HGB 10.7* 11.4*  HCT 31.7* 34.0*  MCV 87.1 86.7  PLT 103* 95*   Iron/TIBC/Ferritin/ %Sat    Component Value Date/Time   IRON 22 (L) 03/12/2016 1308   TIBC 181 (L) 03/12/2016 1308   FERRITIN 1,095 (H) 08/30/2012 1449   IRONPCTSAT 12 03/12/2016 1308

## 2016-09-30 NOTE — Progress Notes (Signed)
  Echocardiogram 2D Echocardiogram has been performed.  [name] 09/30/2016, 12:30 PM

## 2016-09-30 NOTE — Progress Notes (Signed)
Patient ID: [name], female   DOB: November 06, 1955, 61 y.o.   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 Daughter provided most of the history on admission as pt was lethargic. Pt was transferred from [facility] for further evaluation and concern with troponin ~5.   Per recent record review, pt was admitted: -7/18-20 for acute on chronic diastolic dysfunction thought to be related to insufficient volume being drawn off at HD, leading to chronic volume overload.  She also had bacteremia and a subtherapeutic INR.  She left AMA. -6/30-7/5 for respiratory failure associated with volume overload.  Echo showed lateral wall akinesis and a marked change in EF.  She underwent cardiac cath which showed probable new disease but the decision was made for medical management. -6/21-23 for respiratory failure associated with volume overload. -5/21-31 (with rehab stay through 6/12) for R BKA (5/29), following L BKA 3 months prior  Assessment & Plan:   Acute metabolic encephalopathy, syncope - suspect this is multifactorial in the setting of chronic medical issues, ? Cardiac ischemia, hypotension  - TSH also elevated at 16, ? Medical compliance  - pt is more alert this am and says she feels much better  - will need PT/OT eval  - BP is still soft but better overall, pt was started on low dose midodrine and will see if that helps with BP  Elevated troponin, Hypotension, Non T elevation MI - in pt with known CAD, EF 30% - troponin has trended down - per cardiology, continue medical management with no plan of new interventions  - continue Coumadin but will need to hold today as INR 6.96  Chronic systolic heart failure - EF 30% on recent ECHO one month ago    - continue volume control with HD - pt has been on Coreg but dose titration limited due to low BP  Supertherapeutic INR - this AM 5.5, holding Coumadin   COPD - resp status stable this AM   ESRD, hyperkalemia, hyponatremia  - per nephrology  - Continue Renavite, Phoslo, and Calcitriol. - K is stable and WNL this AM   Hypothyroidism - TSH elevated - dose of Synthroid already increased to 200 mcg daily   S/P B BKA  Anemia of chronic disease, thrombocytopenia - Plt down overnight but Hg overall stable - no evidence of active bleeding  - CBC in AM  Underweight. Moderate calorie malnutrition   - Body mass index is 17.75 kg/m.  - nutritionist consulted   DVT prophylaxis: Coumadin Code Status: DNR Family Communication: pt and daughter at bedside  Disposition Plan: to be determined   Consultants:   Cardiology  Nephrology   Procedures:   None  Antimicrobials:   None  Subjective: More alert this AM, says she feels better.   Objective: Vitals:   09/30/16 0250 09/30/16 0350 09/30/16 0400 09/30/16 0755  BP: 118/68 109/68 106/65 108/68  Pulse: 91 89 88 88  Resp: 16 19 15 18   Temp:   99.1 F (37.3 C) 98.6 F (37 C)  TempSrc:   Oral Oral  SpO2: 100% 100% 100% 100%  Weight:   43 kg (94 lb 12.8 oz)     Intake/Output Summary (Last 24 hours) at 09/30/16  [place] filed at 09/30/16 0400  Gross per 24 hour  Intake              570 ml  Output             1540 ml  Net             -970 ml   Filed Weights   09/29/16 1022 09/29/16 1407 09/30/16 0400  Weight: 46.9 kg (103 lb 6.3 oz) 45.4 kg (100 lb 1.4 oz) 43 kg (94 lb 12.8 oz)    Physical Exam  Constitutional: Appears calm, NAD CVS: RRR, S1/S2 +, no gallops, no carotid bruit.  Pulmonary: Effort and breath sounds normal, no stridor, rhonchi, wheezes, rales.  Abdominal: Soft. BS +,  no distension, tenderness, rebound or guarding.  Musculoskeletal: B BKA  Data Reviewed: I have personally reviewed  following labs and imaging studies  CBC:  Recent Labs Lab 09/29/16 0510 09/29/16 1753  WBC 11.8* 11.9*  HGB 10.7* 11.4*  HCT 31.7* 34.0*  MCV 87.1 86.7  PLT 103* 95*   Basic Metabolic Panel:  Recent Labs Lab 09/29/16 0510 09/29/16 1235 09/30/16 0430  NA 133* 137 133*  K 5.2* 3.3* 3.9  CL 98* 102 98*  CO2 29 27 29   GLUCOSE 103* 91 158*  BUN 23* 7 13  CREATININE 6.00* 2.22* 3.60*  CALCIUM 7.9* 7.8* 8.1*  PHOS  --  2.2* 4.4   Coagulation Profile:  Recent Labs Lab 09/28/16 2330 09/30/16 0430  INR 3.48 5.25*   Cardiac Enzymes:  Recent Labs Lab 09/28/16 2330 09/29/16 0510 09/29/16 0935  TROPONINI 2.76* 2.47* 2.80*   CBG:  Recent Labs Lab 09/29/16 1640 09/29/16 1704 09/29/16 1744 09/29/16 2148 09/30/16 0757  GLUCAP 42* 56* 85 159* 119*   Thyroid Function Tests:  Recent Labs  09/28/16 2330  TSH 16.599*   Urine analysis:    Component Value Date/Time   COLORURINE YELLOW 11/02/2012 0428   APPEARANCEUR CLOUDY (A) 11/02/2012 0428   LABSPEC 1.011 11/02/2012 0428   PHURINE 8.0 11/02/2012 0428   GLUCOSEU 250 (A) 11/02/2012 0428   HGBUR LARGE (A) 11/02/2012 0428   BILIRUBINUR NEGATIVE 11/02/2012 0428   KETONESUR NEGATIVE 11/02/2012 0428   PROTEINUR >300 (A) 11/02/2012 0428   UROBILINOGEN 0.2 11/02/2012 0428   NITRITE NEGATIVE 11/02/2012 0428   LEUKOCYTESUR NEGATIVE 11/02/2012 0428   Recent Results (from the past 240 hour(s))  MRSA PCR Screening     Status: None   Collection Time: 09/28/16 11:43 PM  Result Value Ref Range Status   MRSA by PCR NEGATIVE NEGATIVE Final    Comment:        The GeneXpert MRSA Assay (FDA approved for NASAL specimens only), is one component of a comprehensive MRSA colonization surveillance program. It is not intended to diagnose MRSA infection nor to guide or monitor treatment for MRSA infections.     Radiology Studies: No results found.  Scheduled Meds: . aspirin EC  81 mg Oral Q1200  . calcium acetate   1,334 mg Oral TID WC  . carvedilol  3.125 mg Oral BID WC  . docusate sodium  100 mg Oral BID  . doxercalciferol  0.5 mcg Oral Q M,W,F-HD  . feeding supplement (PRO-STAT SUGAR FREE 64)  30 mL Oral BID  . levothyroxine  200 mcg Oral QAC breakfast  . metoCLOPramide  5 mg Oral BID AC  . midodrine  5 mg Oral BID WC  . mirtazapine  15 mg Oral Daily  .  multivitamin  1 tablet Oral QHS  . sodium chloride flush  3 mL Intravenous Q12H  . tacrolimus  2 mg Oral BID  . Warfarin - Pharmacist Dosing Inpatient   Does not apply q1800   Continuous Infusions: . sodium chloride    . sodium chloride 100 mL (09/29/16 1930)     LOS: 2 days   Time spent: 35 minutes  Faye Ramsay, MD [facility] Pager [phone]  If 7PM-7AM, please contact night-coverage [email or web address] Password [facility] 09/30/2016, 9:52 AM

## 2016-10-01 ENCOUNTER — Encounter: Payer: Medicare Other | Admitting: Vascular Surgery

## 2016-10-01 DIAGNOSIS — I959 Hypotension, unspecified: Secondary | ICD-10-CM

## 2016-10-01 DIAGNOSIS — D638 Anemia in other chronic diseases classified elsewhere: Secondary | ICD-10-CM

## 2016-10-01 LAB — RENAL FUNCTION PANEL
ALBUMIN: 1.5 g/dL — AB (ref 3.5–5.0)
ANION GAP: 9 (ref 5–15)
BUN: 28 mg/dL — AB (ref 6–20)
CHLORIDE: 97 mmol/L — AB (ref 101–111)
CO2: 26 mmol/L (ref 22–32)
Calcium: 8.1 mg/dL — ABNORMAL LOW (ref 8.9–10.3)
Creatinine, Ser: 5.11 mg/dL — ABNORMAL HIGH (ref 0.44–1.00)
GFR calc Af Amer: 10 mL/min — ABNORMAL LOW (ref >60)
GFR, EST NON AFRICAN AMERICAN: 8 mL/min — AB (ref >60)
Glucose, Bld: 209 mg/dL — ABNORMAL HIGH (ref 65–99)
PHOSPHORUS: 5.5 mg/dL — AB (ref 2.5–4.6)
POTASSIUM: 3.7 mmol/L (ref 3.5–5.1)
Sodium: 132 mmol/L — ABNORMAL LOW (ref 135–145)

## 2016-10-01 LAB — CBC
HEMATOCRIT: 30.1 % — AB (ref 36.0–46.0)
HEMOGLOBIN: 10.2 g/dL — AB (ref 12.0–15.0)
MCH: 28.9 pg (ref 26.0–34.0)
MCHC: 33.9 g/dL (ref 30.0–36.0)
MCV: 85.3 fL (ref 78.0–100.0)
Platelets: 97 10*3/uL — ABNORMAL LOW (ref 150–400)
RBC: 3.53 MIL/uL — ABNORMAL LOW (ref 3.87–5.11)
RDW: 17.3 % — AB (ref 11.5–15.5)
WBC: 8.4 10*3/uL (ref 4.0–10.5)

## 2016-10-01 LAB — GLUCOSE, CAPILLARY
GLUCOSE-CAPILLARY: 65 mg/dL (ref 65–99)
GLUCOSE-CAPILLARY: 277 mg/dL — AB (ref 65–99)

## 2016-10-01 LAB — PROTIME-INR
INR: >10
INR: 4.89 — AB
PROTHROMBIN TIME: 88.3 s — AB (ref 11.4–15.2)
Prothrombin Time: 47 seconds — ABNORMAL HIGH (ref 11.4–15.2)

## 2016-10-01 MED ORDER — LIDOCAINE-PRILOCAINE 2.5-2.5 % EX CREA: 1.0000 "application " | TOPICAL_CREAM | CUTANEOUS | Status: DC | PRN

## 2016-10-01 MED ORDER — LIDOCAINE HCL (PF) 1 % IJ SOLN: 5.0000 mL | INTRAMUSCULAR | Status: DC | PRN

## 2016-10-01 MED ORDER — ALTEPLASE 2 MG IJ SOLR
2.0000 mg | Freq: Once | INTRAMUSCULAR | Status: DC | PRN
Start: 2016-10-01 — End: 2016-10-01

## 2016-10-01 MED ORDER — HEPARIN SODIUM (PORCINE) 1000 UNIT/ML DIALYSIS
1000.0000 [IU] | INTRAMUSCULAR | Status: DC | PRN
Start: 2016-10-01 — End: 2016-10-01

## 2016-10-01 MED ORDER — SODIUM CHLORIDE 0.9 % IV SOLN: 100.0000 mL | INTRAVENOUS | Status: DC | PRN

## 2016-10-01 MED ORDER — PENTAFLUOROPROP-TETRAFLUOROETH EX AERO: 1.0000 "application " | INHALATION_SPRAY | CUTANEOUS | Status: DC | PRN

## 2016-10-01 MED ORDER — MIDODRINE HCL 5 MG PO TABS
ORAL_TABLET | ORAL | Status: AC
  Administered 2016-10-01: 10 mg via ORAL
  Filled 2016-10-01: qty 2

## 2016-10-01 NOTE — Progress Notes (Signed)
PT Cancellation Note  Patient Details Name: [name] MRN: [id number] DOB: [birth year]   Cancelled Treatment:    Reason Eval/Treat Not Completed: Patient at procedure or test/unavailable. Pt is at HD. Pt to return as able to perform PT eval.   Vanesha Athens M Elek Holderness 10/01/2016, 8:12 AM  Kittie Plater, PT, DPT Pager #: [phone] Office #: [phone]

## 2016-10-01 NOTE — Progress Notes (Signed)
Patient ID: [name], female   DOB: 1955-04-24, 61 y.o.   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 Daughter provided most of the history on admission as pt was lethargic. Pt was transferred from [facility] for further evaluation and concern with troponin ~5. Currently CP free and no complaining of SOB.   Per recent record review, pt was admitted: -7/18-20 for acute on chronic diastolic dysfunction thought to be related to insufficient volume being drawn off at HD, leading to chronic volume overload.  She also had bacteremia and a subtherapeutic INR.  She left AMA. -6/30-7/5 for respiratory failure associated with volume overload.  Echo showed lateral wall akinesis and a marked change in EF.  She underwent cardiac cath which showed probable new disease but the decision was made for medical management. -6/21-23 for respiratory failure associated with volume overload. -5/21-31 (with rehab stay through 6/12) for R BKA (5/29), following L BKA 3 months prior  Assessment & Plan: Acute metabolic encephalopathy, syncope - suspect this is multifactorial in the setting of chronic medical issues, ? Cardiac ischemia, hypotension and abnormal thyroid profile. - AAOX3 in no acute disf -will follow PT/OT evaluation to assist on discharge plans -BP has remained soft and to certain degree impairing potential meds usage. -per renal started on midodrine -will follow clinical response  Elevated troponin, Hypotension, Non ST elevation MI - in pt with known CAD, EF 30% - per cardiology, continue medical management with no plan of new interventions  - no CP or SOB currently   Chronic systolic heart failure - EF 30% on recent ECHO one month ago  -  condition overall compensated  -will continue volume control with HD -continue daily weights and I's and O's. -no ACE or ARB due to renal failure; currently holding B-blocker due to hypotension   Supertherapeutic INR -INR even higher today in the morning (>10); levels repeat and down to 4.89 -will continue holding coumadin for now -pharmacy assisting with dosing  COPD -stable and currently no wheezing  ESRD, hyperkalemia, hyponatremia  -management per nephrology; will follow rec's -continue HD -will continue phoslo and calditriolper nephrology   Hypothyroidism -will need thyroid panel reassessment in 3-4 weeks -continue synthroid at a adjusted dose   S/P Bilateral BKA -will assess with PT mobility and transfer -no open wounds on stumps  Anemia of chronic disease, thrombocytopenia - no acute bleeding appreciated -will monitor trend -IV iron and aranesp as per renal service rec's  Underweight. Moderate calorie malnutrition   -Body mass index is 17.71 kg/m. -will follow nutritional service recommendations for feeding supplements  DVT prophylaxis: Coumadin (currently on hold due to elevated INR) Code Status: DNR Family Communication: no family at bedside  Disposition Plan: to be determine; remain inpatient for now. Follow BP with initiation of midodrine. Cardiology has sign off. Continue medical management.   Consultants:   Cardiology  Nephrology   Procedures:   None   Antimicrobials:   None  Subjective: AAOX3, in no acute distress. Denies CP, SOB, no nausea, no vomiting. Feeling better and tolerating HD treatment.   Objective: Vitals:   10/01/16 0930 10/01/16 1000 10/01/16 1030 10/01/16 1046  BP: 115/81 (!) 98/45 98/68 107/61  Pulse: 92 80  77 79  Resp: 16 12 11 10   Temp:    97.6 F (36.4 C)  TempSrc:      SpO2: 100% 100% 100% 100%  Weight:        Intake/Output Summary (Last 24 hours) at 10/01/16 1442 Last data filed at 10/01/16 1046  Gross per 24  hour  Intake                0 ml  Output                0 ml  Net                0 ml   Filed Weights   09/29/16 1407 09/30/16 0400 10/01/16 0659  Weight: 45.4 kg (100 lb 1.4 oz) 43 kg (94 lb 12.8 oz) 46.8 kg (103 lb 2.8 oz)    Physical Exam  Constitutional: seen in HD; no acute distress, denies CP and SOB. No nausea or vomiting.  CVS: RRR, no rubs, no gallops, no murmurs.  Pulmonary: normal resp effort, no wheezing, no crackles. Good air movement.  Abdominal: soft, NT, ND, positive BS.  Musculoskeletal: bilateral BKD, no open wounds on her stumps.  Data Reviewed: I have personally reviewed following labs and imaging studies  CBC:  Recent Labs Lab 09/29/16 0510 09/29/16 1753 10/01/16 0718  WBC 11.8* 11.9* 8.4  HGB 10.7* 11.4* 10.2*  HCT 31.7* 34.0* 30.1*  MCV 87.1 86.7 85.3  PLT 103* 95* 97*   Basic Metabolic Panel:  Recent Labs Lab 09/29/16 0510 09/29/16 1235 09/30/16 0430 10/01/16 0719  NA 133* 137 133* 132*  K 5.2* 3.3* 3.9 3.7  CL 98* 102 98* 97*  CO2 29 27 29 26   GLUCOSE 103* 91 158* 209*  BUN 23* 7 13 28*  CREATININE 6.00* 2.22* 3.60* 5.11*  CALCIUM 7.9* 7.8* 8.1* 8.1*  PHOS  --  2.2* 4.4 5.5*   Coagulation Profile:  Recent Labs Lab 09/28/16 2330 09/30/16 0430 10/01/16 0718 10/01/16 1226  INR 3.48 5.25* >10.00* 4.89*   Cardiac Enzymes:  Recent Labs Lab 09/28/16 2330 09/29/16 0510 09/29/16 0935  TROPONINI 2.76* 2.47* 2.80*   CBG:  Recent Labs Lab 09/30/16 0757 09/30/16 1124 09/30/16 1649 09/30/16 2120 10/01/16 1137  GLUCAP 119* 165* 214* 163* 65   Thyroid Function Tests:  Recent Labs  09/28/16 2330  TSH 16.599*   Urine analysis:    Component Value Date/Time   COLORURINE YELLOW 11/02/2012 0428   APPEARANCEUR CLOUDY (A) 11/02/2012 0428   LABSPEC 1.011 11/02/2012 0428   PHURINE 8.0 11/02/2012 0428   GLUCOSEU 250 (A) 11/02/2012 0428   HGBUR LARGE (A) 11/02/2012 0428   BILIRUBINUR NEGATIVE 11/02/2012 0428   KETONESUR  NEGATIVE 11/02/2012 0428   PROTEINUR >300 (A) 11/02/2012 0428   UROBILINOGEN 0.2 11/02/2012 0428   NITRITE NEGATIVE 11/02/2012 0428   LEUKOCYTESUR NEGATIVE 11/02/2012 0428   Recent Results (from the past 240 hour(s))  MRSA PCR Screening     Status: None   Collection Time: 09/28/16 11:43 PM  Result Value Ref Range Status   MRSA by PCR NEGATIVE NEGATIVE Final    Comment:        The GeneXpert MRSA Assay (FDA approved for NASAL specimens only), is one component of a comprehensive MRSA colonization surveillance program. It is not intended to diagnose MRSA infection nor to guide or monitor treatment for MRSA infections.     Radiology Studies: No results found.  Scheduled Meds: . aspirin EC  81 mg  Oral Q1200  . calcium acetate  1,334 mg Oral TID WC  . carvedilol  3.125 mg Oral BID WC  . docusate sodium  100 mg Oral BID  . doxercalciferol  0.5 mcg Oral Q M,W,F-HD  . feeding supplement  1 Container Oral TID BM  . feeding supplement (PRO-STAT SUGAR FREE 64)  30 mL Oral BID  . levothyroxine  200 mcg Oral QAC breakfast  . metoCLOPramide  5 mg Oral BID AC  . midodrine  10 mg Oral TID WC  . mirtazapine  15 mg Oral Daily  . multivitamin  1 tablet Oral QHS  . sodium chloride flush  3 mL Intravenous Q12H  . tacrolimus  2 mg Oral BID  . Warfarin - Pharmacist Dosing Inpatient   Does not apply q1800   Continuous Infusions:    LOS: 3 days   Time spent: 25 minutes  Barton Dubois, MD [facility] Pager [phone]  If 7PM-7AM, please contact night-coverage [email or web address] Password [email or web address] 10/01/2016, 2:42 PM

## 2016-10-01 NOTE — Progress Notes (Addendum)
ANTICOAGULATION CONSULT NOTE  Pharmacy Consult for Warfarin  Indication: Mechanical AVR  Allergies  Allergen Reactions  . Acetaminophen Other (See Comments)    Liver transplant recipient   . Codeine Itching  . Mirtazapine Other (See Comments)    hallucination    Patient Measurements: Height:  (does not know new height since bilateral BKA's) Weight: 103 lb 2.8 oz (46.8 kg)  Vital Signs: Temp: 97.6 F (36.4 C) (08/22 1046) Temp Source: Oral (08/22 0659) BP: 107/61 (08/22 1046) Pulse Rate: 79 (08/22 1046)  Labs:  Recent Labs  09/28/16 2330  09/29/16 0510 09/29/16 0935 09/29/16 1235 09/29/16 1753 09/30/16 0430 10/01/16 0718 10/01/16 0719  HGB  --   < > 10.7*  --   --  11.4*  --  10.2*  --   HCT  --   --  31.7*  --   --  34.0*  --  30.1*  --   PLT  --   --  103*  --   --  95*  --  97*  --   LABPROT 35.8*  --   --   --   --   --  49.7* 88.3*  --   INR 3.48  --   --   --   --   --  5.25* >10.00*  --   CREATININE  --   < > 6.00*  --  2.22*  --  3.60*  --  5.11*  TROPONINI 2.76*  --  2.47* 2.80*  --   --   --   --   --   < > = values in this interval not displayed.  Estimated Creatinine Clearance: 8.4 mL/min (A) (by C-G formula based on SCr of 5.11 mg/dL (H)).   Medical History: Past Medical History:  Diagnosis Date  . Anemia    takes Folic Acid daily  . Anxiety   . Arthritis    "left hand, back" (08/30/2012)  . Asthma   . CAD (coronary artery disease) Jan. 2015   Cath: 20% LAD, 50% D1; s/p LIMA-LAD  . Chest pain 03/06/2015  . CHF (congestive heart failure) ([place]) 05/2016  . Chronic back pain   . Chronic constipation    takes MIralax and Colace daily  . COPD (chronic obstructive pulmonary disease) ([place])   . Depression    takes Cymbalta for "severe" depression  . ESRD on dialysis [facility])    "MWF; [facility]" (08/12/2016)  . GERD (gastroesophageal reflux disease)    takes Omeprazole daily  . Headache    "at least monthly" (08/30/2012)  . Hepatitis C   . History  of blood transfusion    "several" (08/30/2012)  . Hypertension    takes Metoprolol and Lisinopril daily, sees Dr Bea Graff  . Hypothyroidism    takes Synthroid daily  . Migraine    "last migraine was in 2013" (08/30/2012)  . Neuromuscular disorder ([place])    carpal tunnel in right hand  . Obesity   . Peripheral vascular disease (HCC) hands and legs  . Pneumonia    "today and several times before" (08/30/2012)  . 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 S/P bilateral BKA (below knee amputation) ([place]) 04/30/2016  . S/P liver transplant ([place])    2011 at [facility] (cirrhosis due to hep C, got hep C from blood transfuion in 1980's per pt))  . SVT (supraventricular tachycardia) ([place]) 06/09/14  . Tobacco abuse   . Type II diabetes mellitus (HCC)    Levemir  2units daily if > 150    Assessment: 61 y/o F on warfarin PTA for mechanical AVR. INR therapeutic on admit at 3.48 (goal 2.5-3.5). Pharmacy consulted to dose inpatient. INR jumped from 3.48 to 5.25 after 2mg  x 1 (home dose), and warfarin held 8/21.  INR this AM is >10 (drawn from heparinized HD cath). Repeat INR ordered to confirm per Nephrologist. Hg down to 10.2, plt low stable 97. No bleed documented.  PTA dose: 2mg  daily (last outpt [facility] clinic note 09/23/16) - last dose pta "past week"  Noted, INR has been high at last 2 visits at [facility] clinic but asked to hold x 2 days and resume 2mg  daily both times - may require lower dose at d/c.  Goal of Therapy:  INR 2.5-3.5 Monitor platelets by anticoagulation protocol: Yes   Plan:  F/u repeat INR to confirm Daily INR Monitor CBC, s/sx bleeding  Elicia Lamp, PharmD, BCPS Clinical Pharmacist Rx Phone # for today: [phone] After 3:30PM, please call Main Rx: [phone] 10/01/2016 12:55 PM    ADDENDUM:  Stat repeat INR 4.89, remains supratherapeutic.  Plan: Continue to hold warfarin Daily INR Monitor CBC, s/sx bleeding   Elicia Lamp, PharmD, BCPS Clinical Pharmacist Rx Phone # for  today: [phone] After 3:30PM, please call Main Rx: [phone] 10/01/2016 1:51 PM

## 2016-10-01 NOTE — Progress Notes (Signed)
Progress Note  Patient Name: [name] Date of Encounter: 10/01/2016  Primary Cardiologist: Dr. [name]  Subjective   No CP no SOB , in HD   Inpatient Medications    Scheduled Meds: . aspirin EC  81 mg Oral Q1200  . calcium acetate  1,334 mg Oral TID WC  . carvedilol  3.125 mg Oral BID WC  . docusate sodium  100 mg Oral BID  . doxercalciferol  0.5 mcg Oral Q M,W,F-HD  . feeding supplement  1 Container Oral TID BM  . feeding supplement (PRO-STAT SUGAR FREE 64)  30 mL Oral BID  . levothyroxine  200 mcg Oral QAC breakfast  . metoCLOPramide  5 mg Oral BID AC  . midodrine  10 mg Oral TID WC  . mirtazapine  15 mg Oral Daily  . multivitamin  1 tablet Oral QHS  . sodium chloride flush  3 mL Intravenous Q12H  . tacrolimus  2 mg Oral BID  . Warfarin - Pharmacist Dosing Inpatient   Does not apply q1800   Continuous Infusions: . sodium chloride    . sodium chloride     PRN Meds: sodium chloride, sodium chloride, ALPRAZolam, alteplase, bisacodyl, calcium carbonate (dosed in mg elemental calcium), camphor-menthol **AND** hydrOXYzine, docusate sodium, feeding supplement (NEPRO CARB STEADY), heparin, lidocaine (PF), lidocaine-prilocaine, methocarbamol, morphine injection, nitroGLYCERIN, ondansetron **OR** ondansetron (ZOFRAN) IV, oxyCODONE, pentafluoroprop-tetrafluoroeth, pneumococcal 23 valent vaccine, polyethylene glycol, sorbitol, zolpidem   Vital Signs    Vitals:   10/01/16 0800 10/01/16 0830 10/01/16 0900 10/01/16 0930  BP: 100/62 110/66 100/64 115/81  Pulse: 73 74 79 92  Resp: 14   16  Temp:      TempSrc:      SpO2:      Weight:       No intake or output data in the 24 hours ending 10/01/16 0958 Filed Weights   09/29/16 1407 09/30/16 0400 10/01/16 0659  Weight: 100 lb 1.4 oz (45.4 kg) 94 lb 12.8 oz (43 kg) 103 lb 2.8 oz (46.8 kg)    Telemetry    Sinus rhythm with occasional PVCs - Personally Reviewed   Physical Exam   GEN: Well nourished, in no acute  distress Frail HEENT: normal  Neck: no JVD, carotid bruits, or masses Cardiac: RRR, sharp s2; no murmurs, rubs, or gallops,no edema  Respiratory:  clear to auscultation bilaterally, normal work of breathing GI: soft, nontender, nondistended, + BS MS: amputations Skin: warm and dry, no rash Neuro:  Alert and Oriented x 3, Strength and sensation are intact Psych: euthymic mood, full affect   Labs    Chemistry  Recent Labs Lab 09/29/16 1235 09/30/16 0430 10/01/16 0719  NA 137 133* 132*  K 3.3* 3.9 3.7  CL 102 98* 97*  CO2 27 29 26   GLUCOSE 91 158* 209*  BUN 7 13 28*  CREATININE 2.22* 3.60* 5.11*  CALCIUM 7.8* 8.1* 8.1*  ALBUMIN 1.8* 1.5* 1.5*  GFRNONAA 23* 13* 8*  GFRAA 26* 15* 10*  ANIONGAP 8 6 9      Hematology  Recent Labs Lab 09/29/16 0510 09/29/16 1753 10/01/16 0718  WBC 11.8* 11.9* 8.4  RBC 3.64* 3.92 3.53*  HGB 10.7* 11.4* 10.2*  HCT 31.7* 34.0* 30.1*  MCV 87.1 86.7 85.3  MCH 29.4 29.1 28.9  MCHC 33.8 33.5 33.9  RDW 17.8* 17.4* 17.3*  PLT 103* 95* 97*    Cardiac Enzymes  Recent Labs Lab 09/28/16 2330 09/29/16 0510 09/29/16 0935  TROPONINI 2.76* 2.47* 2.80*  No results for input(s): TROPIPOC in the last 168 hours.   BNP  Recent Labs Lab 09/29/16 0510  BNP 2,546.5*     DDimer No results for input(s): DDIMER in the last 168 hours.   Radiology    No results found.  Cardiac Studies   Echocardiogram 08/11/16: - Left ventricle: LVEF is depressed at approximately 30% with hypokdinesis of the anterior, apical, lateral wallsl Akinesis of the mid/distal inferior/inferoseptal walls. These findings are new compared to echo from Jan 2018 The cavity size was normal. Wall thickness was normal. - Aortic valve: AV prosthesis is present Peak and mean gradients through the valve are 19 and 11 mm Hg respectively . There was mild regurgitation. - Mitral valve: Calcified annulus. There was mild regurgitation. - Left atrium: The atrium  was mildly dilated. - Right ventricle: Systolic function was mildly reduced.  Cardiac catheterization 08/12/16 by Dr. Beverlyn Roux LAD lesion, 50 %stenosed. Mid LAD lesion, 70 %stenosed.  Dist LAD lesion, 99 %stenosed at Graft Insertion Site.  1st Diag lesion, 90 %stenosed.  LIMA-dLAD graft was visualized by angiography and is normal in caliber and anatomically normal with faint competetive flow  Ost Cx lesion, 55 %stenosed.  2nd Mrg lesion, 100 %stenosed. Lat 2nd Mrg lesion, 90 %stenosed.  Ost 3rd Mrg lesion, 100 %stenosed.  Ost RCA lesion, 30 %stenosed.  Dist RCA lesion, 30 %stenosed.  Ost RPDA lesion, 90 %stenosed.  2nd RPLB lesion, 80 %stenosed.  The patient has new CTO of OM2 & OM3 as well as ostial-proximal 80-90% lesions in both the rPDA & rPL2. The CTO vessels are likely not acute as the Troponin levels are not significantly elevated. The one potential PCI target would be ostial rPDA, but with the distal RCA calcified lesion, this would be complicated & would require jailing of the major Right Post-AV Groove Branch with 3 RPL branches.  In the absence of ongoing, active chest pain, reviewing images with Dr. Irish Lack, we both agreed that the best option is to optimize medical management and volume control as she is essentially immobilized in a wheelchair. If symptoms warrant, with ongoing active chest pain, we could consider difficult PCI of the RPDA. This would be a relatively small caliber stent  Plan: Return to nursing unit for ongoing care. Restart IV heparin and warfarin. Optimize medical management and volume control  Patient Profile   [name] is a 61 y.o. female with a hx of ESRD who is being seen for the evaluation of NSTEMI at the request of Dr. Doyle Askew.  Assessment & Plan     Type 2 MI/ demand ischemia  - continue with med mgt as above  - coumadin   - trying to use Bb but BP may preclude use.   - no invasive workup. Recent cath reviewed as  above. Med mgt.   - understands increased mortality risks. Daughter (CNA) understands and is very close to her care.   Mechanical AV  - INR 2-3  - holding because INR was elevated 5, now 10??. Pharmacy directing  ESRD  - HD   Dilated cardiomyopathy  - EF 30%  - looks comfortable  - fluid control via HD  - no ACE-I because of hypotension and ESRD  - may need to hold Bb because of hypotension as well  No new recommendations at this time. Awaiting correction of INR.  Will sign off. Has follow up in clinic.   Signed, Candee Furbish, MD  10/01/2016, 9:58 AM

## 2016-10-01 NOTE — Progress Notes (Signed)
[facility] Progress Note  Subjective: no new c/o's, on HD this am..  INR > 10, sample taken from tunneled HD cath, suspect heparin contamination. Repeat is 4.89.    Vitals:   10/01/16 0930 10/01/16 1000 10/01/16 1030 10/01/16 1046  BP: 115/81 (!) 98/45 98/68 107/61  Pulse: 92 80 77 79  Resp: 16 12 11 10   Temp:    97.6 F (36.4 C)  TempSrc:      SpO2: 100% 100% 100% 100%  Weight:        Inpatient medications: . aspirin EC  81 mg Oral Q1200  . calcium acetate  1,334 mg Oral TID WC  . carvedilol  3.125 mg Oral BID WC  . docusate sodium  100 mg Oral BID  . doxercalciferol  0.5 mcg Oral Q M,W,F-HD  . feeding supplement  1 Container Oral TID BM  . feeding supplement (PRO-STAT SUGAR FREE 64)  30 mL Oral BID  . levothyroxine  200 mcg Oral QAC breakfast  . metoCLOPramide  5 mg Oral BID AC  . midodrine  10 mg Oral TID WC  . mirtazapine  15 mg Oral Daily  . multivitamin  1 tablet Oral QHS  . sodium chloride flush  3 mL Intravenous Q12H  . tacrolimus  2 mg Oral BID  . Warfarin - Pharmacist Dosing Inpatient   Does not apply q1800    ALPRAZolam, bisacodyl, calcium carbonate (dosed in mg elemental calcium), camphor-menthol **AND** hydrOXYzine, docusate sodium, feeding supplement (NEPRO CARB STEADY), methocarbamol, morphine injection, nitroGLYCERIN, ondansetron **OR** ondansetron (ZOFRAN) IV, oxyCODONE, pneumococcal 23 valent vaccine, polyethylene glycol, sorbitol, zolpidem  Exam: Alert, no distress, chron ill appearing No jvd Chest clear bilat RRR no mrg Abd soft ntnd Ext bilat BKA, no edema NF, Ox 3 R IJ TDC, no drainage  CXR not done  Dialysis: MWF [name] 3h 90min  44kg  3K/2.25 bath  R IJ TDC  Hep none -No ESA/Fe -Calcitriol 0.5 mcg PO TIW (Last PTH 342)   BMD meds: Calcium acetate 667 mg 2 caps PO TID AC    Impression: 1  Presyncope/ hypotension - prob due to advanced heart failure and assoc low BPs.  started on midodrine for chronic hypotension at 10 mg  tid.  2  ESRD on HD MWF 3  Vol is up 2-3kg today 4  Anemia Hb 10.7, no esa 5  MBD / CKD - no chgs for now 6  Liver transplant - cont meds 7  FTT in adult - 10 th admission this year 8  DNR 9  Hx AVR 2015 - on coumadin/ INR > 10 today is heparin contamination from drawing thru HD cath 10  CAD/ hx CABG 2015- w type 2 MI/ demand ischemia. Per cards on July admit > cont low dose coreg and asa. BP's too low for ARB or hydral/ nitrate combination. Not ICD candidate given ESRD.  Medical rx of CAD.   11  Chron syst CHF - EF 30%  Plan - HD today    [name] MD [facility] pager [phone]   10/01/2016, 2:54 PM    Recent Labs Lab 09/29/16 1235 09/30/16 0430 10/01/16 0719  NA 137 133* 132*  K 3.3* 3.9 3.7  CL 102 98* 97*  CO2 27 29 26   GLUCOSE 91 158* 209*  BUN 7 13 28*  CREATININE 2.22* 3.60* 5.11*  CALCIUM 7.8* 8.1* 8.1*  PHOS 2.2* 4.4 5.5*    Recent Labs Lab 09/29/16 1235 09/30/16 0430 10/01/16 0719  ALBUMIN 1.8* 1.5* 1.5*    Recent Labs Lab 09/29/16 0510 09/29/16 1753 10/01/16 0718  WBC 11.8* 11.9* 8.4  HGB 10.7* 11.4* 10.2*  HCT 31.7* 34.0* 30.1*  MCV 87.1 86.7 85.3  PLT 103* 95* 97*   Iron/TIBC/Ferritin/ %Sat    Component Value Date/Time   IRON 22 (L) 03/12/2016 1308   TIBC 181 (L) 03/12/2016 1308   FERRITIN 1,095 (H) 08/30/2012 1449   IRONPCTSAT 12 03/12/2016 1308

## 2016-10-01 NOTE — Progress Notes (Signed)
Pharmacist Heart Failure Core Measure Documentation  Assessment: [name] has an EF documented as 30-35% on 8/21 by Echo.  Rationale: Heart failure patients with left ventricular systolic dysfunction (LVSD) and an EF < 40% should be prescribed an angiotensin converting enzyme inhibitor (ACEI) or angiotensin receptor blocker (ARB) at discharge unless a contraindication is documented in the medical record.  This patient is not currently on an ACEI or ARB for HF.  This note is being placed in the record in order to provide documentation that a contraindication to the use of these agents is present for this encounter.  ACE Inhibitor or Angiotensin Receptor Blocker is contraindicated (specify all that apply)  []   ACEI allergy AND ARB allergy []   Angioedema []   Moderate or severe aortic stenosis []   Hyperkalemia [x]   Hypotension []   Renal artery stenosis [x]   Worsening renal function, preexisting renal disease or dysfunction   [name] 10/01/2016 8:20 AM

## 2016-10-02 DIAGNOSIS — Z794 Long term (current) use of insulin: Secondary | ICD-10-CM

## 2016-10-02 DIAGNOSIS — I10 Essential (primary) hypertension: Secondary | ICD-10-CM

## 2016-10-02 DIAGNOSIS — E1122 Type 2 diabetes mellitus with diabetic chronic kidney disease: Secondary | ICD-10-CM

## 2016-10-02 DIAGNOSIS — I214 Non-ST elevation (NSTEMI) myocardial infarction: Secondary | ICD-10-CM

## 2016-10-02 LAB — CBC
HCT: 27.4 % — ABNORMAL LOW (ref 36.0–46.0)
Hemoglobin: 9.3 g/dL — ABNORMAL LOW (ref 12.0–15.0)
MCH: 29.3 pg (ref 26.0–34.0)
MCHC: 33.9 g/dL (ref 30.0–36.0)
MCV: 86.4 fL (ref 78.0–100.0)
PLATELETS: 77 10*3/uL — AB (ref 150–400)
RBC: 3.17 MIL/uL — ABNORMAL LOW (ref 3.87–5.11)
RDW: 17.7 % — AB (ref 11.5–15.5)
WBC: 7.1 10*3/uL (ref 4.0–10.5)

## 2016-10-02 LAB — PROTIME-INR
INR: 3.79
Prothrombin Time: 38.4 seconds — ABNORMAL HIGH (ref 11.4–15.2)

## 2016-10-02 LAB — GLUCOSE, CAPILLARY
GLUCOSE-CAPILLARY: 221 mg/dL — AB (ref 65–99)
Glucose-Capillary: 218 mg/dL — ABNORMAL HIGH (ref 65–99)
Glucose-Capillary: 107 mg/dL — ABNORMAL HIGH (ref 65–99)

## 2016-10-02 MED ORDER — WARFARIN SODIUM 2 MG PO TABS: ORAL_TABLET | ORAL | 1 refills | Status: DC

## 2016-10-02 MED ORDER — MIDODRINE HCL 10 MG PO TABS: 10.0000 mg | ORAL_TABLET | Freq: Three times a day (TID) | ORAL | 1 refills | Status: AC

## 2016-10-02 MED ORDER — LEVOTHYROXINE SODIUM 200 MCG PO TABS: 200.0000 ug | ORAL_TABLET | Freq: Every day | ORAL | 1 refills | Status: AC

## 2016-10-02 MED ORDER — WARFARIN SODIUM 1 MG PO TABS: 0.5000 mg | ORAL_TABLET | Freq: Once | ORAL | Status: DC

## 2016-10-02 NOTE — Discharge Summary (Signed)
Physician Discharge Summary  [name] OVF:[id number] DOB: Mar 19, 1955 DOA: 09/28/2016  PCP: Clovia Cuff, MD  Admit date: 09/28/2016 Discharge date: 10/02/2016  Time spent: 35 minutes  Recommendations for Outpatient Follow-up:  Repeat thyroid panel in 3-4 weeks; adjust levothyroxine regimen as needed  Repeat CBC to follow Hgb trend  Reassess BP and adjust medications as needed (if possible adjust up her b-blocker; if low and/or imparing HD, discontinue b-blocker) Repeat BMET to follow electrolytes   Discharge Diagnoses:  Principal Problem:   Syncope Active Problems:   End stage renal disease on dialysis (HCC)   Chronic combined systolic and diastolic congestive heart failure (HCC)   Chronic anticoagulation w/ coumadin, goal INR 2.5-3.0 w/ AVR   Elevated troponin   Benign essential HTN   History of liver transplant ([place])   Acute encephalopathy   NSTEMI (non-ST elevated myocardial infarction) [facility])   Discharge Condition: stable and improved. Discharge home with [facility] services and care by daughter. Patient instructed to follow up with PCP in 10 days.  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 Daughter provided most of the history on admission as pt was lethargic. Pt was transferred from [facility] for further evaluation and concern with troponin ~5. Currently CP free and no complaining of SOB.   Hospital Course:  Acute metabolic encephalopathy, syncope, physical deconditioning  - suspect this is multifactorial in the setting of chronic medical issues, ? Cardiac ischemia, hypotension and abnormal thyroid profile. - AAOX3 in no acute distress  -will follow  PT/OT recommendations and arranged for [facility] services  -BP has remained soft and to certain degree impairing potential meds usage. -per renal started on midodrine -follow up VS and adjust meds further as needed; especially follow for any symptoms or drops during HD.  Elevated troponin, Hypotension, Non ST elevation MI - in a pt with known CAD, EF 25-30% - per cardiology, continue medical management with no plan of new interventions  - no CP or SOB currently  -continue ASA and b-blocker   Chronic systolic heart failure -EF 30% on recent ECHO one month ago  -condition overall compensated  -will continue volume control with HD -continue daily weights and low sodium diet  -no ACE or ARB due to renal failure -low BP limiting use of B-blockers and treatment overall for her heart failure. -after initiating midodrine by renal service; low dose b-blocker has been resumed.  Supertherapeutic INR -INR at discharge 3.79 -goal is for 2.5 3.5 -no signs of acute bleeding -will resume coumadin on 8/24 -needs outpatient follow up with coumadin clinic.   Hx of Liver Transplant -will continue tacrolimus  COPD -stable and currently no wheezing -continue home medication regimen   ESRD, hyperkalemia, hyponatremia  -management per nephrology -continue HD -will continue phoslo and calcitriol    Hypothyroidism -will need thyroid panel reassessment in 3-4 weeks -continue synthroid at a adjusted dose   S/P Bilateral BKA -will assess with PT mobility and transfer -no open wounds on stumps  Anemia of chronic disease, thrombocytopenia -no acute bleeding appreciated -will monitor trend -IV iron and aranesp as per renal service rec's  Underweight. Moderate calorie malnutrition   -Body mass index is 17.71 kg/m. -will follow nutritional service recommendations for feeding supplements -patient encourage to maintain good PO intake  Procedures:  None   Consultations:  Renal service    Cardiology   Discharge Exam: Vitals:   10/02/16 0823 10/02/16 1100  BP: 119/69 128/74  Pulse: 71 75  Resp:  (!) 27  Temp: 98.1 F (36.7 C) 98.2 F (36.8 C)  SpO2:  100%   Constitutional: afebrile, in no distress. Denies CP and SOB. Patient w/o any further syncope event.   CVS: RRR, no rubs, no gallops, no murmurs.  Pulmonary: normal resp effort, no wheezing, no crackles. Good air movement.  Abdominal: soft, NT, ND, positive BS.  Musculoskeletal: bilateral BKD, no open wounds on her stumps.  Discharge Instructions   Discharge Instructions    AMB Referral to [place] Management    Complete by:  As directed    Please assign to [place]. Currently in [place]. Written consent signed. Daughter- [name] primary contact person [phone]. Please see liaison notes on 09/30/16. Thanks. [name], [place], RN,BSN-THN [facility] [phone]   Reason for consult:  Please assign to [facility] RNCM   Diagnoses of:  Kidney Failure   Expected date of contact:  1-3 days (reserved for hospital discharges)   Diet - low sodium heart healthy    Complete by:  As directed    Discharge instructions    Complete by:  As directed    Follow daily weight and heart healthy diet  Please take medications as prescribed  Maintain adequate hydration  Check your BP at home twice a day and create and log Follow up with PCP in 10 days     Current Discharge Medication List    START taking these medications   Details  midodrine (PROAMATINE) 10 MG tablet Take 1 tablet (10 mg total) by mouth 3 (three) times daily with meals. Qty: 90 tablet, Refills: 1      CONTINUE these medications which have CHANGED   Details  levothyroxine (SYNTHROID, LEVOTHROID) 200 MCG tablet Take 1 tablet (200 mcg total) by mouth daily before breakfast. Qty: 30 tablet, Refills: 1    warfarin (COUMADIN) 2 MG tablet Take 1-2 tablets by mouth daily as directed by coumadin clinic Qty:  90 tablet, Refills: 1      CONTINUE these medications which have NOT CHANGED   Details  ALPRAZolam (XANAX) 0.5 MG tablet Take 0.5 mg by mouth 3 (three) times daily as needed for anxiety.     aspirin EC 81 MG tablet Take 81 mg by mouth daily at 12 noon.     carvedilol (COREG) 3.125 MG tablet Take 1 tablet (3.125 mg total) by mouth 2 (two) times daily with a meal. Qty: 60 tablet, Refills: 3    metoCLOPramide (REGLAN) 5 MG tablet Take 1 tablet (5 mg total) by mouth 2 (two) times daily before a meal. Decrease to once a day before breakfast after a couple of weeks. Qty: 60 tablet, Refills: 0    mirtazapine (REMERON) 15 MG tablet Take 15 mg by mouth daily. Refills: 2    nitroGLYCERIN (NITROSTAT) 0.4 MG SL tablet Place 1 tablet (0.4 mg total) under the tongue every 5 (five) minutes as needed for chest pain. Qty: 30 tablet, Refills: 0    ondansetron (ZOFRAN ODT) 8 MG disintegrating tablet 8mg  ODT q4 hours prn nausea Qty: 6 tablet, Refills: 0    oxyCODONE (OXY IR/ROXICODONE) 5  MG immediate release tablet Take 1 tablet (5 mg total) by mouth every 4 (four) hours as needed for severe pain. Qty: 12 tablet, Refills: 0    tacrolimus (PROGRAF) 1 MG capsule Take 2 mg by mouth 2 (two) times daily.     traZODone (DESYREL) 50 MG tablet Take 50 mg by mouth at bedtime.    Amino Acids-Protein Hydrolys (FEEDING SUPPLEMENT, PRO-STAT SUGAR FREE 64,) LIQD Take 30 mLs by mouth 2 (two) times daily. Qty: 900 mL, Refills: 0    calcitRIOL (ROCALTROL) 0.5 MCG capsule Take 1 capsule (0.5 mcg total) by mouth every Monday, Wednesday, and Friday with hemodialysis. Qty: 30 capsule, Refills: 1    calcium acetate (PHOSLO) 667 MG capsule Take 1 capsule (667 mg total) by mouth 3 (three) times daily with meals. Qty: 90 capsule, Refills: 1    feeding supplement (BOOST / RESOURCE BREEZE) LIQD Take 1 Container by mouth 3 (three) times daily between meals. Qty: 1 Container, Refills: 100    methocarbamol (ROBAXIN) 500 MG  tablet Take 0.5 tablets (250 mg total) by mouth every 6 (six) hours as needed for muscle spasms. Qty: 60 tablet, Refills: 0    multivitamin (RENA-VIT) TABS tablet Take 1 tablet by mouth at bedtime. Qty: 30 tablet, Refills: 0       Allergies  Allergen Reactions  . Acetaminophen Other (See Comments)    Liver transplant recipient   . Codeine Itching  . Mirtazapine Other (See Comments)    hallucination   Follow-up Information    Clovia Cuff, MD. Schedule an appointment as soon as possible for a visit in 10 day(s).   Specialty:  Internal Medicine Contact information: [address] [place] [address] [phone]           The results of significant diagnostics from this hospitalization (including imaging, microbiology, ancillary and laboratory) are listed below for reference.    Significant Diagnostic Studies: No results found.  Microbiology: Recent Results (from the past 240 hour(s))  MRSA PCR Screening     Status: None   Collection Time: 09/28/16 11:43 PM  Result Value Ref Range Status   MRSA by PCR NEGATIVE NEGATIVE Final    Comment:        The GeneXpert MRSA Assay (FDA approved for NASAL specimens only), is one component of a comprehensive MRSA colonization surveillance program. It is not intended to diagnose MRSA infection nor to guide or monitor treatment for MRSA infections.      Labs: Basic Metabolic Panel:  Recent Labs Lab 09/29/16 0510 09/29/16 1235 09/30/16 0430 10/01/16 0719  NA 133* 137 133* 132*  K 5.2* 3.3* 3.9 3.7  CL 98* 102 98* 97*  CO2 29 27 29 26   GLUCOSE 103* 91 158* 209*  BUN 23* 7 13 28*  CREATININE 6.00* 2.22* 3.60* 5.11*  CALCIUM 7.9* 7.8* 8.1* 8.1*  PHOS  --  2.2* 4.4 5.5*   Liver Function Tests:  Recent Labs Lab 09/29/16 1235 09/30/16 0430 10/01/16 0719  ALBUMIN 1.8* 1.5* 1.5*   CBC:  Recent Labs Lab 09/29/16 0510 09/29/16 1753 10/01/16 0718 10/02/16 0244  WBC 11.8* 11.9* 8.4  7.1  HGB 10.7* 11.4* 10.2* 9.3*  HCT 31.7* 34.0* 30.1* 27.4*  MCV 87.1 86.7 85.3 86.4  PLT 103* 95* 97* 77*   Cardiac Enzymes:  Recent Labs Lab 09/28/16 2330 09/29/16 0510 09/29/16 0935  TROPONINI 2.76* 2.47* 2.80*   BNP: BNP (last 3 results)  Recent Labs  08/09/16 1930 08/27/16 0738 09/29/16  0510  BNP >4,500.0* >4,500.0* 2,546.5*   CBG:  Recent Labs Lab 10/01/16 1137 10/01/16 1649 10/01/16 2051 10/02/16 0749 10/02/16 1156  GLUCAP 65 277* 218* 107* 221*    Signed:  Barton Dubois MD.  [facility] 10/02/2016, 2:47 PM

## 2016-10-02 NOTE — Progress Notes (Signed)
Patient received discharge information and acknowledged understanding of it. Patient IV was removed.  

## 2016-10-02 NOTE — Evaluation (Signed)
Physical Therapy Evaluation Patient Details Name: [name] MRN: [id number] DOB: 1955/07/09 Today's Date: 10/02/2016   History of Present Illness  Pt adm with syncopal episode and NSTEMI. PMH - Bil BKA, ESRD on HD, htn, dm, cad, back pain, NSTEMI, liver transplant  Clinical Impression  Pt doing well with mobility. Pt reports she has begun to have bil wrist pain. Explained to pt this is likely due to repetitive use due to using UE's for all mobility. Suggested pt use ice for pain relief. Also reviewed ways to lessen pressure on wrist with mobility. Asked pt about follow up for prosthetic fitting and pt reported she hadn't seen anyone since she was in the hospital soon after her surgery. Pt said it was [employer] who saw her at that time. Gave pt the phone number to call [employer] at [place] and set up appt. [name] lt residual limb looks well healed. Rt residual limb has some eschar still present at incision. Pt agreeable to the above and also agreeable to HHPT.    Follow Up Recommendations Home health PT;Other (comment) (call orthotist)    Equipment Recommendations  None recommended by PT    Recommendations for Other Services       Precautions / Restrictions Precautions Precautions: None Restrictions Weight Bearing Restrictions: No      Mobility  Bed Mobility Overal bed mobility: Modified Independent                Transfers Overall transfer level: Modified independent Equipment used: None             General transfer comment: Pt laterally scooted on side of bed to demonstrate transfer ability  Ambulation/Gait                Stairs            Wheelchair Mobility    Modified Rankin (Stroke Patients Only)       Balance Overall balance assessment: Needs assistance Sitting-balance support: No upper extremity supported;Feet unsupported Sitting balance-Leahy Scale: Good                                       Pertinent Vitals/Pain Pain  Assessment: Faces Faces Pain Scale: Hurts little more Pain Location: bil wrist Pain Descriptors / Indicators: Sore Pain Intervention(s): Repositioned;Limited activity within patient's tolerance    Home Living Family/patient expects to be discharged to:: Private residence Living Arrangements: Children Available Help at Discharge: Family;Available 24 hours/day Type of Home: Mobile home Home Access: Ramped entrance     Home Layout: One level Home Equipment: Bedside commode;Walker - 2 wheels;Wheelchair - manual      Prior Function Level of Independence: Needs assistance   Gait / Transfers Assistance Needed: Pt able to transfer modified independent to/from w/c with lateral scoot. Uses sliding board if height differential present.           Hand Dominance   Dominant Hand: Right    Extremity/Trunk Assessment   Upper Extremity Assessment Upper Extremity Assessment: Overall WFL for tasks assessed    Lower Extremity Assessment Lower Extremity Assessment: RLE deficits/detail;LLE deficits/detail RLE Deficits / Details: BKA LLE Deficits / Details: BKA       Communication   Communication: No difficulties  Cognition Arousal/Alertness: Awake/alert Behavior During Therapy: WFL for tasks assessed/performed Overall Cognitive Status: Within Functional Limits for tasks assessed  General Comments      Exercises     Assessment/Plan    PT Assessment All further PT needs can be met in the next venue of care  PT Problem List Decreased mobility;Decreased balance       PT Treatment Interventions      PT Goals (Current goals can be found in the Care Plan section)  Acute Rehab PT Goals PT Goal Formulation: All assessment and education complete, DC therapy    Frequency     Barriers to discharge        Co-evaluation               AM-PAC PT "6 Clicks" Daily Activity  Outcome Measure Difficulty turning over in  bed (including adjusting bedclothes, sheets and blankets)?: None Difficulty moving from lying on back to sitting on the side of the bed? : None Difficulty sitting down on and standing up from a chair with arms (e.g., wheelchair, bedside commode, etc,.)?: Unable Help needed moving to and from a bed to chair (including a wheelchair)?: None Help needed walking in hospital room?: Total Help needed climbing 3-5 steps with a railing? : Total 6 Click Score: 15    End of Session   Activity Tolerance: Patient tolerated treatment well Patient left: in bed;with call bell/phone within reach;with family/visitor present (sitting EOB) Nurse Communication: Mobility status PT Visit Diagnosis: Other abnormalities of gait and mobility (R26.89)    Time: 1053-1110 PT Time Calculation (min) (ACUTE ONLY): 17 min   Charges:   PT Evaluation $PT Eval Low Complexity: 1 Low     PT G CodesMarland Kitchen        [facility] PT [place] 10/02/2016, 11:21 AM

## 2016-10-02 NOTE — Care Management Note (Signed)
Case Management Note  Patient Details  Name: [name] MRN: [id number] Date of Birth: 03-Sep-1955  Subjective/Objective:   Pt presented for Syncope. Bilateral BKA- has an appointment with [employer] 10-07-16 @ 9:30- Family to f/u with Rx via Vascular MD. Hx ESRD on HD MWF. Pt is from home with support of daughter. Pt was previously active with [facility] and was d/c from the agency. Pt now in need of [place] list provided and pt chose [facility].                 Action/Plan: Referral sent to [facility] with [name]. SOC to begin within 24-48 hours post d/c. Daughter to provide transportation home. No further needs from CM at this time.   Expected Discharge Date:  10/02/16               Expected Discharge Plan:  [place]  In-House Referral:  NA  Discharge planning Services  CM Consult  Post Acute Care Choice:  Home Health Choice offered to:  Patient  DME Arranged:  N/A DME Agency:  NA  HH Arranged:  RN, PT, OT HH Agency:  [place]  Status of Service:  Completed, signed off  If discussed at [place] of Stay Meetings, dates discussed:    Additional Comments:  Bethena Roys, RN 10/02/2016, 3:05 PM

## 2016-10-02 NOTE — Progress Notes (Signed)
[facility] Progress Note  Subjective: no new c/o's, doing well Vitals:   10/02/16 0402 10/02/16 0558 10/02/16 0823 10/02/16 1100  BP: 106/63 101/61 119/69 128/74  Pulse:  67 71 75  Resp:  11  (!) 27  Temp:  98 F (36.7 C) 98.1 F (36.7 C) 98.2 F (36.8 C)  TempSrc:  Oral Oral Oral  SpO2:  100%  100%  Weight:  45.9 kg (101 lb 3.1 oz)      Inpatient medications: . aspirin EC  81 mg Oral Q1200  . calcium acetate  1,334 mg Oral TID WC  . carvedilol  3.125 mg Oral BID WC  . docusate sodium  100 mg Oral BID  . doxercalciferol  0.5 mcg Oral Q M,W,F-HD  . feeding supplement  1 Container Oral TID BM  . feeding supplement (PRO-STAT SUGAR FREE 64)  30 mL Oral BID  . levothyroxine  200 mcg Oral QAC breakfast  . metoCLOPramide  5 mg Oral BID AC  . midodrine  10 mg Oral TID WC  . mirtazapine  15 mg Oral Daily  . multivitamin  1 tablet Oral QHS  . sodium chloride flush  3 mL Intravenous Q12H  . tacrolimus  2 mg Oral BID  . warfarin  0.5 mg Oral ONCE-1800  . Warfarin - Pharmacist Dosing Inpatient   Does not apply q1800    ALPRAZolam, bisacodyl, calcium carbonate (dosed in mg elemental calcium), camphor-menthol **AND** hydrOXYzine, docusate sodium, feeding supplement (NEPRO CARB STEADY), methocarbamol, morphine injection, nitroGLYCERIN, ondansetron **OR** ondansetron (ZOFRAN) IV, oxyCODONE, pneumococcal 23 valent vaccine, polyethylene glycol, sorbitol, zolpidem  Exam: Alert, no distress, chron ill appearing No jvd Chest clear bilat RRR no mrg Abd soft ntnd Ext bilat BKA, no edema NF, Ox 3 R IJ TDC, no drainage  CXR not done  Dialysis: MWF [name] 3h 64min  44kg  3K/2.25 bath  R IJ TDC  Hep none -No ESA/Fe -Calcitriol 0.5 mcg PO TIW (Last PTH 342)   BMD meds: Calcium acetate 667 mg 2 caps PO TID AC    Impression: 1  Presyncope/ hypotension - due to worsening heart failure and worsening hypothyroidism.  T4 dosing increased , and cardiology saw pt for advancing  CHF.  Has normal EF last Oct but has been steadily declining since, now down to 20-25%.  On low dose coreg for syst HF.  Told pt that coreg is not for HTN anymore but just for systolic CHF, as BP's will allow.  Started midodrine here 10 mg tid which seems to have helped.   2  ESRD on HD MWF 3  Vol - no excess on exam, up 1-2kg by wts 4  Anemia Hb 10.7, no esa 5  MBD / CKD - no chgs for now 6  Liver transplant - cont meds 7  FTT in adult - 10 th admission this year 8  DNR 9  Hx AVR 2015 - on coumadin, INR better 10  CAD/ hx CABG 2015- w type 2 MI/ demand ischemia. Per cards on July admit > cont low dose coreg and asa. BP's too low for ARB or hydral/ nitrate combination. Not ICD candidate given ESRD.  Medical rx of CAD.   11  Chron syst CHF - EF 25% now  Plan - ok for dc    [name] MD [place] pager [phone]   10/02/2016, 1:47 PM    Recent Labs Lab 09/29/16 1235 09/30/16 0430 10/01/16 0719  NA 137 133* 132*  K  3.3* 3.9 3.7  CL 102 98* 97*  CO2 27 29 26   GLUCOSE 91 158* 209*  BUN 7 13 28*  CREATININE 2.22* 3.60* 5.11*  CALCIUM 7.8* 8.1* 8.1*  PHOS 2.2* 4.4 5.5*    Recent Labs Lab 09/29/16 1235 09/30/16 0430 10/01/16 0719  ALBUMIN 1.8* 1.5* 1.5*    Recent Labs Lab 09/29/16 1753 10/01/16 0718 10/02/16 0244  WBC 11.9* 8.4 7.1  HGB 11.4* 10.2* 9.3*  HCT 34.0* 30.1* 27.4*  MCV 86.7 85.3 86.4  PLT 95* 97* 77*   Iron/TIBC/Ferritin/ %Sat    Component Value Date/Time   IRON 22 (L) 03/12/2016 1308   TIBC 181 (L) 03/12/2016 1308   FERRITIN 1,095 (H) 08/30/2012 1449   IRONPCTSAT 12 03/12/2016 1308

## 2016-10-02 NOTE — Care Management Important Message (Signed)
Important Message  Patient Details  Name: [name] MRN: [id number] Date of Birth: 1955-10-13   Medicare Important Message Given:  Yes    [name] Montine Circle 10/02/2016, 11:24 AM

## 2016-10-02 NOTE — Progress Notes (Signed)
ANTICOAGULATION CONSULT NOTE  Pharmacy Consult for Warfarin  Indication: Mechanical AVR  Allergies  Allergen Reactions  . Acetaminophen Other (See Comments)    Liver transplant recipient   . Codeine Itching  . Mirtazapine Other (See Comments)    hallucination    Patient Measurements: Height:  (UTA, pt unable to tell since bil. BKA) Weight: 101 lb 3.1 oz (45.9 kg)  Vital Signs: Temp: 98.1 F (36.7 C) (08/23 0823) Temp Source: Oral (08/23 0823) BP: 119/69 (08/23 0823) Pulse Rate: 71 (08/23 0823)  Labs:  Recent Labs  09/29/16 1235  09/29/16 1753  09/30/16 0430 10/01/16 0718 10/01/16 0719 10/01/16 1226 10/02/16 0244  HGB  --   < > 11.4*  --   --  10.2*  --   --  9.3*  HCT  --   --  34.0*  --   --  30.1*  --   --  27.4*  PLT  --   --  95*  --   --  97*  --   --  77*  LABPROT  --   --   --   < > 49.7* 88.3*  --  47.0* 38.4*  INR  --   --   --   < > 5.25* >10.00*  --  4.89* 3.79  CREATININE 2.22*  --   --   --  3.60*  --  5.11*  --   --   < > = values in this interval not displayed.  Estimated Creatinine Clearance: 8.3 mL/min (A) (by C-G formula based on SCr of 5.11 mg/dL (H)).   Medical History: Past Medical History:  Diagnosis Date  . Anemia    takes Folic Acid daily  . Anxiety   . Arthritis    "left hand, back" (08/30/2012)  . Asthma   . CAD (coronary artery disease) Jan. 2015   Cath: 20% LAD, 50% D1; s/p LIMA-LAD  . Chest pain 03/06/2015  . CHF (congestive heart failure) ([place]) 05/2016  . Chronic back pain   . Chronic constipation    takes MIralax and Colace daily  . COPD (chronic obstructive pulmonary disease) ([place])   . Depression    takes Cymbalta for "severe" depression  . ESRD on dialysis [facility])    "MWF; [facility]" (08/12/2016)  . GERD (gastroesophageal reflux disease)    takes Omeprazole daily  . Headache    "at least monthly" (08/30/2012)  . Hepatitis C   . History of blood transfusion    "several" (08/30/2012)  . Hypertension    takes  Metoprolol and Lisinopril daily, sees Dr Bea Graff  . Hypothyroidism    takes Synthroid daily  . Migraine    "last migraine was in 2013" (08/30/2012)  . Neuromuscular disorder ([place])    carpal tunnel in right hand  . Obesity   . Peripheral vascular disease (HCC) hands and legs  . Pneumonia    "today and several times before" (08/30/2012)  . 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 S/P bilateral BKA (below knee amputation) ([place]) 04/30/2016  . 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 SVT (supraventricular tachycardia) ([place]) 06/09/14  . Tobacco abuse   . Type II diabetes mellitus (HCC)    Levemir 2units daily if > 150    Assessment: 61 y/o F on warfarin PTA for mechanical AVR. INR therapeutic on admit at 3.48 (goal 2.5-3.5).  Pharmacy consulted to dose inpatient. INR jumped from 3.48 to 5.25 after 2mg  x 1 (home dose), and warfarin held 8/21-22.  INR down to 3.79 this AM, remains slightly supratherapeutic. CBC trend down - Hg 9.3, plt 77. No bleed documented.  PTA dose: 2mg  daily (last outpt [facility] clinic note 09/23/16) - last dose pta "past week"  Noted, INR has been high at last 2 visits at [facility] clinic but asked to hold x 2 days and resume 2mg  daily both times - likely will require lower dose on d/c.  Goal of Therapy:  INR 2.5-3.5 Monitor platelets by anticoagulation protocol: Yes   Plan:  Warfarin 0.5mg  PO x 1 to prevent INR from dropping too low tomorrow Daily INR Monitor CBC, s/sx bleeding  Elicia Lamp, PharmD, BCPS Clinical Pharmacist Rx Phone # for today: [phone] After 3:30PM, please call Main Rx: #[id number] 10/02/2016 11:39 AM

## 2016-10-03 ENCOUNTER — Other Ambulatory Visit: Payer: Self-pay

## 2016-10-03 DIAGNOSIS — E118 Type 2 diabetes mellitus with unspecified complications: Secondary | ICD-10-CM | POA: Diagnosis not present

## 2016-10-03 DIAGNOSIS — N2581 Secondary hyperparathyroidism of renal origin: Secondary | ICD-10-CM | POA: Diagnosis not present

## 2016-10-03 DIAGNOSIS — N186 End stage renal disease: Secondary | ICD-10-CM | POA: Diagnosis not present

## 2016-10-03 DIAGNOSIS — E876 Hypokalemia: Secondary | ICD-10-CM | POA: Diagnosis not present

## 2016-10-03 DIAGNOSIS — D631 Anemia in chronic kidney disease: Secondary | ICD-10-CM | POA: Diagnosis not present

## 2016-10-03 NOTE — Patient Outreach (Signed)
Transition of care:  Placed call to patient and spoke with daughter who is on consent. Explained reason for call.  Daughter is managing her medications and reports that patient has all her medications. Daughter is a Financial trader.  Daughter is not able to review medications via phone.  Patient is currently in dialysis.  Daughter reports that patient had a panic attack last night.  Daughter reports that patient wants to be independent but is not able to due to be a double amputee at this time.    Reviewed home health orders with daughter who states that she has to call [name] and will do that today.   Offered transition of care visit to assess needs on Tuesday 8/28 and daughter has accepted. Confirmed address and provided my contact information.  [name], RN, BSN, CEN [facility] [employer] [phone]

## 2016-10-06 ENCOUNTER — Inpatient Hospital Stay (HOSPITAL_COMMUNITY)
Admission: AD | Admit: 2016-10-06 | Discharge: 2016-10-09 | DRG: 280 | Disposition: A | Discharge status: Home / Self Care | Payer: Medicare Other | Source: Other Acute Inpatient Hospital | Attending: Internal Medicine | Admitting: Internal Medicine

## 2016-10-06 DIAGNOSIS — I472 Ventricular tachycardia: Secondary | ICD-10-CM | POA: Diagnosis not present

## 2016-10-06 DIAGNOSIS — K219 Gastro-esophageal reflux disease without esophagitis: Secondary | ICD-10-CM | POA: Diagnosis present

## 2016-10-06 DIAGNOSIS — M549 Dorsalgia, unspecified: Secondary | ICD-10-CM | POA: Diagnosis present

## 2016-10-06 DIAGNOSIS — Z993 Dependence on wheelchair: Secondary | ICD-10-CM | POA: Diagnosis not present

## 2016-10-06 DIAGNOSIS — E876 Hypokalemia: Secondary | ICD-10-CM | POA: Diagnosis not present

## 2016-10-06 DIAGNOSIS — Z952 Presence of prosthetic heart valve: Secondary | ICD-10-CM

## 2016-10-06 DIAGNOSIS — I214 Non-ST elevation (NSTEMI) myocardial infarction: Principal | ICD-10-CM | POA: Diagnosis present

## 2016-10-06 DIAGNOSIS — Z992 Dependence on renal dialysis: Secondary | ICD-10-CM

## 2016-10-06 DIAGNOSIS — N186 End stage renal disease: Secondary | ICD-10-CM

## 2016-10-06 DIAGNOSIS — I25119 Atherosclerotic heart disease of native coronary artery with unspecified angina pectoris: Secondary | ICD-10-CM | POA: Diagnosis present

## 2016-10-06 DIAGNOSIS — F419 Anxiety disorder, unspecified: Secondary | ICD-10-CM | POA: Diagnosis present

## 2016-10-06 DIAGNOSIS — I959 Hypotension, unspecified: Secondary | ICD-10-CM | POA: Diagnosis present

## 2016-10-06 DIAGNOSIS — Z886 Allergy status to analgesic agent status: Secondary | ICD-10-CM

## 2016-10-06 DIAGNOSIS — E039 Hypothyroidism, unspecified: Secondary | ICD-10-CM | POA: Diagnosis present

## 2016-10-06 DIAGNOSIS — I2510 Atherosclerotic heart disease of native coronary artery without angina pectoris: Secondary | ICD-10-CM | POA: Diagnosis present

## 2016-10-06 DIAGNOSIS — K5909 Other constipation: Secondary | ICD-10-CM | POA: Diagnosis present

## 2016-10-06 DIAGNOSIS — Z7901 Long term (current) use of anticoagulants: Secondary | ICD-10-CM | POA: Diagnosis not present

## 2016-10-06 DIAGNOSIS — Z794 Long term (current) use of insulin: Secondary | ICD-10-CM | POA: Diagnosis not present

## 2016-10-06 DIAGNOSIS — E871 Hypo-osmolality and hyponatremia: Secondary | ICD-10-CM | POA: Diagnosis not present

## 2016-10-06 DIAGNOSIS — Z89512 Acquired absence of left leg below knee: Secondary | ICD-10-CM

## 2016-10-06 DIAGNOSIS — E1151 Type 2 diabetes mellitus with diabetic peripheral angiopathy without gangrene: Secondary | ICD-10-CM | POA: Diagnosis present

## 2016-10-06 DIAGNOSIS — J449 Chronic obstructive pulmonary disease, unspecified: Secondary | ICD-10-CM | POA: Diagnosis present

## 2016-10-06 DIAGNOSIS — Z8249 Family history of ischemic heart disease and other diseases of the circulatory system: Secondary | ICD-10-CM | POA: Diagnosis not present

## 2016-10-06 DIAGNOSIS — D649 Anemia, unspecified: Secondary | ICD-10-CM | POA: Diagnosis present

## 2016-10-06 DIAGNOSIS — Z951 Presence of aortocoronary bypass graft: Secondary | ICD-10-CM

## 2016-10-06 DIAGNOSIS — Z944 Liver transplant status: Secondary | ICD-10-CM

## 2016-10-06 DIAGNOSIS — Z6820 Body mass index (BMI) 20.0-20.9, adult: Secondary | ICD-10-CM

## 2016-10-06 DIAGNOSIS — Z89511 Acquired absence of right leg below knee: Secondary | ICD-10-CM | POA: Diagnosis not present

## 2016-10-06 DIAGNOSIS — R079 Chest pain, unspecified: Secondary | ICD-10-CM | POA: Diagnosis not present

## 2016-10-06 DIAGNOSIS — R627 Adult failure to thrive: Secondary | ICD-10-CM | POA: Diagnosis present

## 2016-10-06 DIAGNOSIS — N185 Chronic kidney disease, stage 5: Secondary | ICD-10-CM | POA: Diagnosis not present

## 2016-10-06 DIAGNOSIS — I132 Hypertensive heart and chronic kidney disease with heart failure and with stage 5 chronic kidney disease, or end stage renal disease: Secondary | ICD-10-CM | POA: Diagnosis present

## 2016-10-06 DIAGNOSIS — J42 Unspecified chronic bronchitis: Secondary | ICD-10-CM | POA: Diagnosis not present

## 2016-10-06 DIAGNOSIS — N2581 Secondary hyperparathyroidism of renal origin: Secondary | ICD-10-CM | POA: Diagnosis present

## 2016-10-06 DIAGNOSIS — I25118 Atherosclerotic heart disease of native coronary artery with other forms of angina pectoris: Secondary | ICD-10-CM | POA: Diagnosis not present

## 2016-10-06 DIAGNOSIS — I5042 Chronic combined systolic (congestive) and diastolic (congestive) heart failure: Secondary | ICD-10-CM | POA: Diagnosis present

## 2016-10-06 DIAGNOSIS — Z833 Family history of diabetes mellitus: Secondary | ICD-10-CM

## 2016-10-06 DIAGNOSIS — Z823 Family history of stroke: Secondary | ICD-10-CM

## 2016-10-06 DIAGNOSIS — G8929 Other chronic pain: Secondary | ICD-10-CM | POA: Diagnosis present

## 2016-10-06 DIAGNOSIS — F329 Major depressive disorder, single episode, unspecified: Secondary | ICD-10-CM | POA: Diagnosis present

## 2016-10-06 DIAGNOSIS — E1122 Type 2 diabetes mellitus with diabetic chronic kidney disease: Secondary | ICD-10-CM | POA: Diagnosis present

## 2016-10-06 DIAGNOSIS — D631 Anemia in chronic kidney disease: Secondary | ICD-10-CM | POA: Diagnosis not present

## 2016-10-06 DIAGNOSIS — N189 Chronic kidney disease, unspecified: Secondary | ICD-10-CM

## 2016-10-06 DIAGNOSIS — I4729 Other ventricular tachycardia: Secondary | ICD-10-CM

## 2016-10-06 DIAGNOSIS — E118 Type 2 diabetes mellitus with unspecified complications: Secondary | ICD-10-CM | POA: Diagnosis not present

## 2016-10-06 DIAGNOSIS — Z7982 Long term (current) use of aspirin: Secondary | ICD-10-CM

## 2016-10-06 DIAGNOSIS — I359 Nonrheumatic aortic valve disorder, unspecified: Secondary | ICD-10-CM | POA: Diagnosis not present

## 2016-10-06 DIAGNOSIS — Z888 Allergy status to other drugs, medicaments and biological substances status: Secondary | ICD-10-CM

## 2016-10-06 DIAGNOSIS — Z809 Family history of malignant neoplasm, unspecified: Secondary | ICD-10-CM

## 2016-10-06 DIAGNOSIS — Z885 Allergy status to narcotic agent status: Secondary | ICD-10-CM

## 2016-10-06 DIAGNOSIS — Z66 Do not resuscitate: Secondary | ICD-10-CM | POA: Diagnosis present

## 2016-10-06 DIAGNOSIS — Z9049 Acquired absence of other specified parts of digestive tract: Secondary | ICD-10-CM

## 2016-10-07 ENCOUNTER — Encounter (HOSPITAL_COMMUNITY): Payer: Self-pay | Admitting: Family Medicine

## 2016-10-07 ENCOUNTER — Ambulatory Visit: Payer: Self-pay

## 2016-10-07 DIAGNOSIS — I214 Non-ST elevation (NSTEMI) myocardial infarction: Principal | ICD-10-CM

## 2016-10-07 DIAGNOSIS — N186 End stage renal disease: Secondary | ICD-10-CM

## 2016-10-07 DIAGNOSIS — E039 Hypothyroidism, unspecified: Secondary | ICD-10-CM

## 2016-10-07 DIAGNOSIS — I25118 Atherosclerotic heart disease of native coronary artery with other forms of angina pectoris: Secondary | ICD-10-CM

## 2016-10-07 DIAGNOSIS — Z944 Liver transplant status: Secondary | ICD-10-CM

## 2016-10-07 DIAGNOSIS — Z992 Dependence on renal dialysis: Secondary | ICD-10-CM

## 2016-10-07 DIAGNOSIS — I2510 Atherosclerotic heart disease of native coronary artery without angina pectoris: Secondary | ICD-10-CM

## 2016-10-07 DIAGNOSIS — I5042 Chronic combined systolic (congestive) and diastolic (congestive) heart failure: Secondary | ICD-10-CM

## 2016-10-07 DIAGNOSIS — E871 Hypo-osmolality and hyponatremia: Secondary | ICD-10-CM | POA: Diagnosis present

## 2016-10-07 LAB — CBC
HEMATOCRIT: 26.9 % — AB (ref 36.0–46.0)
Hemoglobin: 9.1 g/dL — ABNORMAL LOW (ref 12.0–15.0)
MCH: 29.4 pg (ref 26.0–34.0)
MCHC: 33.8 g/dL (ref 30.0–36.0)
MCV: 87.1 fL (ref 78.0–100.0)
PLATELETS: 152 10*3/uL (ref 150–400)
RBC: 3.09 MIL/uL — ABNORMAL LOW (ref 3.87–5.11)
RDW: 17.8 % — AB (ref 11.5–15.5)
WBC: 6.8 10*3/uL (ref 4.0–10.5)

## 2016-10-07 LAB — BASIC METABOLIC PANEL
ANION GAP: 11 (ref 5–15)
BUN: 12 mg/dL (ref 6–20)
CALCIUM: 8.2 mg/dL — AB (ref 8.9–10.3)
CO2: 22 mmol/L (ref 22–32)
Chloride: 100 mmol/L — ABNORMAL LOW (ref 101–111)
Creatinine, Ser: 3.97 mg/dL — ABNORMAL HIGH (ref 0.44–1.00)
GFR calc Af Amer: 13 mL/min — ABNORMAL LOW (ref >60)
GFR calc non Af Amer: 11 mL/min — ABNORMAL LOW (ref >60)
GLUCOSE: 86 mg/dL (ref 65–99)
Potassium: 4.3 mmol/L (ref 3.5–5.1)
Sodium: 133 mmol/L — ABNORMAL LOW (ref 135–145)

## 2016-10-07 LAB — LIPID PANEL
Cholesterol: 169 mg/dL (ref 0–200)
HDL: 33 mg/dL — AB (ref >40)
LDL CALC: 119 mg/dL — AB (ref 0–99)
Total CHOL/HDL Ratio: 5.1 RATIO
Triglycerides: 86 mg/dL (ref <150)
VLDL: 17 mg/dL (ref 0–40)

## 2016-10-07 LAB — PROTIME-INR
INR: 2.31
PROTHROMBIN TIME: 25.8 s — AB (ref 11.4–15.2)

## 2016-10-07 LAB — TROPONIN I
TROPONIN I: 1.95 ng/mL — AB (ref <0.03)
Troponin I: 2.21 ng/mL (ref <0.03)

## 2016-10-07 LAB — MRSA PCR SCREENING: MRSA by PCR: NEGATIVE

## 2016-10-07 MED ORDER — BOOST / RESOURCE BREEZE PO LIQD
1.0000 | Freq: Three times a day (TID) | ORAL | Status: DC
  Administered 2016-10-07 – 2016-10-09 (×6): 1 via ORAL

## 2016-10-07 MED ORDER — NITROGLYCERIN 0.4 MG SL SUBL: 0.4000 mg | SUBLINGUAL_TABLET | SUBLINGUAL | Status: DC | PRN

## 2016-10-07 MED ORDER — TACROLIMUS 1 MG PO CAPS
2.0000 mg | ORAL_CAPSULE | Freq: Two times a day (BID) | ORAL | Status: DC
  Administered 2016-10-07 – 2016-10-09 (×6): 2 mg via ORAL
  Filled 2016-10-07 (×7): qty 2

## 2016-10-07 MED ORDER — CALCITRIOL 0.25 MCG PO CAPS
0.5000 ug | ORAL_CAPSULE | ORAL | Status: DC
  Administered 2016-10-08: 0.5 ug via ORAL
  Filled 2016-10-07: qty 2

## 2016-10-07 MED ORDER — ONDANSETRON HCL 4 MG/2ML IJ SOLN: 4.0000 mg | Freq: Four times a day (QID) | INTRAMUSCULAR | Status: DC | PRN

## 2016-10-07 MED ORDER — OXYCODONE HCL 5 MG PO TABS
5.0000 mg | ORAL_TABLET | ORAL | Status: DC | PRN
  Administered 2016-10-07 – 2016-10-08 (×3): 5 mg via ORAL
  Filled 2016-10-07 (×3): qty 1

## 2016-10-07 MED ORDER — ATORVASTATIN CALCIUM 40 MG PO TABS: 40.0000 mg | ORAL_TABLET | Freq: Every day | ORAL | Status: DC

## 2016-10-07 MED ORDER — TRAZODONE HCL 50 MG PO TABS
50.0000 mg | ORAL_TABLET | Freq: Every day | ORAL | Status: DC
  Administered 2016-10-07 – 2016-10-08 (×2): 50 mg via ORAL
  Filled 2016-10-07 (×2): qty 1

## 2016-10-07 MED ORDER — METOPROLOL TARTRATE 12.5 MG HALF TABLET: 12.5000 mg | ORAL_TABLET | Freq: Two times a day (BID) | ORAL | Status: DC

## 2016-10-07 MED ORDER — WARFARIN - PHARMACIST DOSING INPATIENT: Freq: Every day | Status: DC

## 2016-10-07 MED ORDER — ALPRAZOLAM 0.5 MG PO TABS: 0.5000 mg | ORAL_TABLET | Freq: Three times a day (TID) | ORAL | Status: DC | PRN

## 2016-10-07 MED ORDER — WARFARIN SODIUM 4 MG PO TABS
4.0000 mg | ORAL_TABLET | Freq: Once | ORAL | Status: AC
  Administered 2016-10-07: 4 mg via ORAL
  Filled 2016-10-07: qty 1

## 2016-10-07 MED ORDER — ASPIRIN EC 81 MG PO TBEC
81.0000 mg | DELAYED_RELEASE_TABLET | Freq: Every day | ORAL | Status: DC
  Administered 2016-10-07 – 2016-10-09 (×3): 81 mg via ORAL
  Filled 2016-10-07 (×3): qty 1

## 2016-10-07 MED ORDER — CARVEDILOL 3.125 MG PO TABS
3.1250 mg | ORAL_TABLET | Freq: Two times a day (BID) | ORAL | Status: DC
  Administered 2016-10-07: 3.125 mg via ORAL
  Filled 2016-10-07: qty 1

## 2016-10-07 MED ORDER — MIRTAZAPINE 7.5 MG PO TABS
15.0000 mg | ORAL_TABLET | Freq: Every day | ORAL | Status: DC
  Administered 2016-10-07 – 2016-10-09 (×3): 15 mg via ORAL
  Filled 2016-10-07 (×3): qty 2

## 2016-10-07 MED ORDER — METOPROLOL TARTRATE 12.5 MG HALF TABLET
12.5000 mg | ORAL_TABLET | Freq: Two times a day (BID) | ORAL | Status: DC
  Administered 2016-10-07 – 2016-10-08 (×2): 12.5 mg via ORAL
  Filled 2016-10-07 (×2): qty 1

## 2016-10-07 MED ORDER — LEVOTHYROXINE SODIUM 100 MCG PO TABS
200.0000 ug | ORAL_TABLET | Freq: Every day | ORAL | Status: DC
  Administered 2016-10-07 – 2016-10-09 (×3): 200 ug via ORAL
  Filled 2016-10-07 (×3): qty 2

## 2016-10-07 MED ORDER — CALCIUM ACETATE (PHOS BINDER) 667 MG PO CAPS
667.0000 mg | ORAL_CAPSULE | Freq: Three times a day (TID) | ORAL | Status: DC
  Administered 2016-10-07 – 2016-10-09 (×6): 667 mg via ORAL
  Filled 2016-10-07 (×8): qty 1

## 2016-10-07 MED ORDER — MIDODRINE HCL 5 MG PO TABS
10.0000 mg | ORAL_TABLET | Freq: Three times a day (TID) | ORAL | Status: DC
  Administered 2016-10-07 – 2016-10-09 (×8): 10 mg via ORAL
  Filled 2016-10-07 (×8): qty 2

## 2016-10-07 NOTE — Discharge Instructions (Signed)

## 2016-10-07 NOTE — H&P (Signed)
History and Physical  Patient Name: [name]     GLO:[id number]    DOB: [birth year]    DOA: 10/06/2016 PCP: Clovia Cuff, MD  Patient coming from: [facility]  Chief Complaint: Chest pain      HPI: [name] is a 61 y.o. 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  The patient was in her usual state of health until last night when she had spontaneous onset of central chest discomfort, mild to moderate in intensity, vague in character. This persisted overnight and through dialysis today, and when it did not go away after dialysis she went to the emergency room. She had no nausea or diaphoresis. She is wheelchair-bound, the pain was not exertional. She claims that she has had pain intermittently over the last 3 weeks, mostly when she ruminates about her daughter, although this is not noted in her Cardiology consult note from 1 week ago.  ED course: -Afebrile, heart rate and respirations normal, BP normal -Na 138, K 3.8, Cr 3.2, WBC 6K, Hgb 12.3 -INR 2.5 -Troponin 2.4 -CXR showed no new opacity -ECG showed early repolarization, no change from previous -She was given aspirin 325 mg and the case was discussed with Cardiology who recommended hospitalist admission for NSTEMI      ROS: Review of Systems  Cardiovascular: Positive for chest pain.  All other systems reviewed and are negative.         Past Medical History:  Diagnosis Date  . Anemia    takes Folic Acid daily  . Anxiety   . Arthritis    "left hand, back" (08/30/2012)  . Asthma   . CAD (coronary artery disease) Jan. 2015   Cath: 20% LAD, 50% D1; s/p LIMA-LAD  . Chest pain 03/06/2015  . CHF (congestive heart failure) ([place]) 05/2016  . Chronic back pain   . Chronic constipation    takes MIralax and Colace daily  . COPD (chronic obstructive pulmonary disease) ([place])   .  Depression    takes Cymbalta for "severe" depression  . ESRD on dialysis [facility])    "MWF; [facility]" (08/12/2016)  . GERD (gastroesophageal reflux disease)    takes Omeprazole daily  . Headache    "at least monthly" (08/30/2012)  . Hepatitis C   . History of blood transfusion    "several" (08/30/2012)  . Hypertension    takes Metoprolol and Lisinopril daily, sees Dr Bea Graff  . Hypothyroidism    takes Synthroid daily  . Migraine    "last migraine was in 2013" (08/30/2012)  . Neuromuscular disorder ([place])    carpal tunnel in right hand  . Obesity   . Peripheral vascular disease (HCC) hands and legs  . Pneumonia    "today and several times before" (08/30/2012)  . 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 S/P bilateral BKA (below knee amputation) ([place]) 04/30/2016  . S/P liver transplant ([place])    2011 at [facility] (cirrhosis due to hep C, got hep C from blood transfuion in 1980's per pt))  . SVT (supraventricular tachycardia) ([place]) 06/09/14  . Tobacco abuse   . Type II diabetes mellitus (HCC)    Levemir 2units daily if > 150    Past Surgical History:  Procedure Laterality Date  . ABDOMINAL AORTOGRAM W/LOWER EXTREMITY N/A 07/04/2016   Procedure: Abdominal Aortogram w/Lower Extremity;  Surgeon:  Elam Dutch, MD;  Location: [place] CV LAB;  Service: Cardiovascular;  Laterality: N/A;  . AMPUTATION Left 04/24/2016   Procedure: AMPUTATION BELOW KNEE;  Surgeon: Angelia Mould, MD;  Location: [place];  Service: Vascular;  Laterality: Left;  . AMPUTATION Right 07/08/2016   Procedure: AMPUTATION BELOW KNEE;  Surgeon: Angelia Mould, MD;  Location: [place];  Service: Vascular;  Laterality: Right;  . AORTIC VALVE REPLACEMENT N/A 03/15/2013   AVR; Surgeon: Ivin Poot, MD;  Location: [facility] OR; Open Heart Surgery;  25mm[employer] mechanical prosthesis, top hat valve  . ARTERIOVENOUS GRAFT PLACEMENT Left 10/03/10    forearm  . AV FISTULA PLACEMENT  01/29/2011   Procedure:  INSERTION OF ARTERIOVENOUS (AV) GORE-TEX GRAFT ARM;  Surgeon: Elam Dutch, MD;  Location: [facility] OR;  Service: Vascular;  Laterality: Right;  . AV FISTULA PLACEMENT  03/10/2011   Procedure: INSERTION OF ARTERIOVENOUS (AV) GORE-TEX GRAFT THIGH;  Surgeon: Elam Dutch, MD;  Location: [place];  Service: Vascular;  Laterality: Left;  . AV FISTULA PLACEMENT Left 07/21/2016   Procedure: CREATION OF LEFT UPPER ARM ARTERIOVENOUS (AV) FISTULA;  Surgeon: Conrad Tuckahoe, MD;  Location: [place];  Service: Vascular;  Laterality: Left;  . [place] REMOVAL  12/23/2010   Procedure: REMOVAL OF ARTERIOVENOUS GORETEX GRAFT ([place]);  Surgeon: Elam Dutch, MD;  Location: [place];  Service: Vascular;  Laterality: Left;  procedure started @1736 -6045  . CHOLECYSTECTOMY  1993  . CORONARY ANGIOGRAPHY N/A 08/12/2016   Procedure: CORONARY ANGIOGRAPHY (CATH LAB);  Surgeon: Leonie Man, MD;  Location: [place] CV LAB;  Service: Cardiovascular;  Laterality: N/A;  . CORONARY ARTERY BYPASS GRAFT N/A 03/15/2013   Procedure: CORONARY ARTERY BYPASS GRAFTING (CABG) times one using left internal mammary artery.;  Surgeon: Ivin Poot, MD;  Location: [place];  Service: Open Heart Surgery;  Laterality: N/A;  POSS CABG X 1  . CYSTOSCOPY  1990's  . INSERTION OF DIALYSIS CATHETER  12/23/2010   Procedure: INSERTION OF DIALYSIS CATHETER;  Surgeon: Elam Dutch, MD;  Location: [place];  Service: Vascular;  Laterality: Right;  Right Internal Jugular 28cm dialysis catheter insertion procedure time 1701-1720   . INSERTION OF DIALYSIS CATHETER Right 03/11/2016   Procedure: INSERTION OF DIALYSIS CATHETER;  Surgeon: Angelia Mould, MD;  Location: [place];  Service: Vascular;  Laterality: Right;  . INTRAOPERATIVE TRANSESOPHAGEAL ECHOCARDIOGRAM N/A 03/15/2013   Procedure: INTRAOPERATIVE TRANSESOPHAGEAL ECHOCARDIOGRAM;  Surgeon: Ivin Poot, MD;  Location: [place];  Service: Open Heart Surgery;  Laterality: N/A;  . LEFT HEART CATHETERIZATION  WITH CORONARY ANGIOGRAM N/A 07/29/2012   Procedure: LEFT HEART CATHETERIZATION WITH CORONARY ANGIOGRAM;  Surgeon: Troy Sine, MD;  Location: [facility] CATH LAB;  Service: Cardiovascular;  Laterality: N/A;  . LEFT HEART CATHETERIZATION WITH CORONARY ANGIOGRAM N/A 03/10/2013   Procedure: LEFT HEART CATHETERIZATION WITH CORONARY ANGIOGRAM;  Surgeon: Troy Sine, MD;  Location: [facility] CATH LAB;  Service: Cardiovascular;  Laterality: N/A;  . LEFT HEART CATHETERIZATION WITH CORONARY/GRAFT ANGIOGRAM N/A 12/24/2011   Procedure: LEFT HEART CATHETERIZATION WITH Beatrix Fetters;  Surgeon: [name], MD;  Location: [facility] CATH LAB;  Service: Cardiovascular;  Laterality: N/A;  . LEFT HEART CATHETERIZATION WITH CORONARY/GRAFT ANGIOGRAM N/A 12/16/2013   Procedure: LEFT HEART CATHETERIZATION WITH Beatrix Fetters;  Surgeon: Troy Sine, MD;  Location: [facility] CATH LAB;  Service: Cardiovascular;  Laterality: N/A;  . LIGATION ARTERIOVENOUS GORTEX GRAFT Left 03/11/2016   Procedure: LIGATION THIGH ARTERIOVENOUS GORTEX GRAFT;  Surgeon: Angelia Mould, MD;  Location: [place];  Service: Vascular;  Laterality: Left;  . LIGATION OF ARTERIOVENOUS  FISTULA Left 08/11/2016   Procedure: LIGATION OF ARTERIOVENOUS  FISTULA;  Surgeon: Elam Dutch, MD;  Location: [place];  Service: Vascular;  Laterality: Left;  . LIVER TRANSPLANT  10/25/2009   sees Dr Ferol Luz 1 every 6 months, saw last in Dec 2013. [name] Coord [phone]  . PERIPHERAL VASCULAR CATHETERIZATION N/A 11/06/2015   Procedure: Abdominal Aortogram;  Surgeon: Serafina Mitchell, MD;  Location: [place] CV LAB;  Service: Cardiovascular;  Laterality: N/A;  . PERIPHERAL VASCULAR CATHETERIZATION N/A 11/06/2015   Procedure: Lower Extremity Angiography;  Surgeon: Serafina Mitchell, MD;  Location: [place] CV LAB;  Service: Cardiovascular;  Laterality: N/A;  . PERIPHERAL VASCULAR CATHETERIZATION  11/06/2015   Procedure: Peripheral Vascular Atherectomy;   Surgeon: Serafina Mitchell, MD;  Location: [place] CV LAB;  Service: Cardiovascular;;  Left Superficial femoral  . SHUNTOGRAM Left 05/15/2014   Procedure: SHUNTOGRAM;  Surgeon: Conrad Mathiston, MD;  Location: [facility] CATH LAB;  Service: Cardiovascular;  Laterality: Left;  . SMALL INTESTINE SURGERY  90's  . SPINAL GROWTH RODS  2010   "put 2 metal rods in my back; they had detetriorated" (08/30/2012)  . THROMBECTOMY    . THROMBECTOMY AND REVISION OF ARTERIOVENTOUS (AV) GORETEX  GRAFT Left 03/30/2014  . THROMBECTOMY AND REVISION OF ARTERIOVENTOUS (AV) GORETEX  GRAFT Left 03/30/2014   Procedure: THROMBECTOMY AND REVISION OF ARTERIOVENTOUS (AV) GORETEX  GRAFT;  Surgeon: Conrad [place], MD;  Location: [place];  Service: Vascular;  Laterality: Left;  . TUBAL LIGATION  1990's    Social History: Patient lives with her daughter.  The patient is wheelchair bound.  Smoker.  Allergies  Allergen Reactions  . Acetaminophen Other (See Comments)    Liver transplant recipient   . Codeine Itching  . Mirtazapine Other (See Comments)    hallucination    Family history: family history includes Cancer in her father and mother; Diabetes in her mother; Hypertension in her mother; Stroke in her mother.  Prior to Admission medications   Medication Sig Start Date End Date Taking? Authorizing Provider  ALPRAZolam Duanne Moron) 0.5 MG tablet Take 0.5 mg by mouth 3 (three) times daily as needed for anxiety.  08/26/16   [provider]  Amino Acids-Protein Hydrolys (FEEDING SUPPLEMENT, PRO-STAT SUGAR FREE 64,) LIQD Take 30 mLs by mouth 2 (two) times daily. Patient not taking: Reported on 09/18/2016 08/28/16   Lavina Hamman, MD  aspirin EC 81 MG tablet Take 81 mg by mouth daily at 12 noon.     [provider]  calcitRIOL (ROCALTROL) 0.5 MCG capsule Take 1 capsule (0.5 mcg total) by mouth every Monday, Wednesday, and Friday with hemodialysis. Patient not taking: Reported on 09/29/2016 07/25/16   Angiulli, Lavon Paganini, PA-C    calcium acetate (PHOSLO) 667 MG capsule Take 1 capsule (667 mg total) by mouth 3 (three) times daily with meals. Patient not taking: Reported on 09/29/2016 07/24/16   Angiulli, Lavon Paganini, PA-C  carvedilol (COREG) 3.125 MG tablet Take 1 tablet (3.125 mg total) by mouth 2 (two) times daily with a meal. 08/13/16   Rai, Ripudeep K, MD  feeding supplement (BOOST / RESOURCE BREEZE) LIQD Take 1 Container by mouth 3 (three) times daily between meals. 04/25/16   Reyne Dumas, MD  levothyroxine (SYNTHROID, LEVOTHROID) 200 MCG tablet Take 1 tablet (200 mcg total) by mouth daily before breakfast. 10/03/16  Barton Dubois, MD  methocarbamol (ROBAXIN) 500 MG tablet Take 0.5 tablets (250 mg total) by mouth every 6 (six) hours as needed for muscle spasms. 07/24/16   Angiulli, Lavon Paganini, PA-C  metoCLOPramide (REGLAN) 5 MG tablet Take 1 tablet (5 mg total) by mouth 2 (two) times daily before a meal. Decrease to once a day before breakfast after a couple of weeks. 07/24/16   Angiulli, Lavon Paganini, PA-C  midodrine (PROAMATINE) 10 MG tablet Take 1 tablet (10 mg total) by mouth 3 (three) times daily with meals. 10/02/16   Barton Dubois, MD  mirtazapine (REMERON) 15 MG tablet Take 15 mg by mouth daily. 08/17/16   [provider]  multivitamin (RENA-VIT) TABS tablet Take 1 tablet by mouth at bedtime. Patient not taking: Reported on 08/27/2016 05/20/16   Love, Ivan Anchors, PA-C  nitroGLYCERIN (NITROSTAT) 0.4 MG SL tablet Place 1 tablet (0.4 mg total) under the tongue every 5 (five) minutes as needed for chest pain. 03/20/14   Elgergawy, Silver Huguenin, MD  ondansetron (ZOFRAN ODT) 8 MG disintegrating tablet 8mg  ODT q4 hours prn nausea Patient taking differently: Take 8 mg by mouth every 4 (four) hours as needed for nausea or vomiting.  09/18/16   Veryl Speak, MD  oxyCODONE (OXY IR/ROXICODONE) 5 MG immediate release tablet Take 1 tablet (5 mg total) by mouth every 4 (four) hours as needed for severe pain. 09/18/16   Veryl Speak, MD   tacrolimus (PROGRAF) 1 MG capsule Take 2 mg by mouth 2 (two) times daily.     [provider]  traZODone (DESYREL) 50 MG tablet Take 50 mg by mouth at bedtime.    [provider]  warfarin (COUMADIN) 2 MG tablet Take 1-2 tablets by mouth daily as directed by coumadin clinic 10/03/16   Barton Dubois, MD       Physical Exam: BP 117/62 (BP Location: Left Arm)   Pulse 69   Temp 97.6 F (36.4 C) (Oral)   Resp 15   SpO2 100%  General appearance: Thin elderly adult female, sleeping but easily roused, in no actue distress.   Eyes: Anicteric, conjunctiva pink, lids and lashes normal. PERRL.    ENT: No nasal deformity, discharge, epistaxis.  Hearing normal. OP moist without lesions.  Edentulous. Neck: No neck masses.  Trachea midline.  No thyromegaly/tenderness. Lymph: No cervical or supraclavicular lymphadenopathy. Skin: Warm and dry.  No suspicious rashes or lesions. Cardiac: RRR, nl S1-S2, S2 loud, no murmurs appreciated.  Capillary refill is brisk.  No JVD.  No stump edema.  Radial pulses 2+ and symmetric. Respiratory: Normal respiratory rate and rhythm.  CTAB without rales or wheezes. Abdomen: Abdomen soft.  No TTP. No ascites, distension, hepatosplenomegaly.   MSK: No deformities or effusions.  No cyanosis or clubbing. Neuro: Cranial nerves normal.  Sensation intact to light touch. Speech is fluent.  Muscle strength normal.    Psych: Sensorium intact and responding to questions, attention diminisehd because of sleepiness at this hour.  Behavior appropriate.  Affect blunted.  Judgment and insight appear normal.     Labs on Admission:  I have personally reviewed following labs and imaging studies that were reported above.    Radiological Exams on Admission: Personally reviewed CXR report which details small effusions, no airspace disease.  EKG: Independently reviewed. Rate 86, QTc 457, elevated Jpoint in anterior leads, no change from previous.  Echocardiogram  Aug 2018: Report reviewed EF 30-35% Grade 2 DD Mechanical AV        Assessment/Plan  1. NSTEMI:  Mild pain at present.     -Trend troponins -NPO for possible cath -Consult to Cardiology -Aspirin given -Discussed with pharmacy, high-intensity statins can increase concentrations of Prograf   2. CHF:  Chronic systolic and diastolic.  EF 09% this summer.  No active overload at present.  Dialyzed last today. -HD per Nephrology -Continue BB  3. Hypothyroidism:  -Continue levothyroxine  4. Liver transplant:  -Continue tacrolimus  5. ESRD on HD MWF:  -Continue calcitriol and Phoslo -HD per Nephrology -Continue midodrine  6. Mechanical AV:  -Continue warfarin  7. Diabetes:  Not currently on medications. -Daily glucose check, start SSI if hyperglycemic  8. Other medications: -Continue trazodone and mirtazapine -Continue Xanax           DVT prophylaxis: Warfarin  Code Status: DO NOT RESUSCITATE  Family Communication: None present  Disposition Plan: Anticipate Cardiology consultation and further disposition per Cardiology. Consults called: Cardiology Admission status: INPAITNET   Medical decision making: Patient seen at 12:40 AM on 10/07/2016.  What exists of the patient's chart was reviewed in depth and summarized above.  Clinical condition: stable.        Edwin Dada [facility] Pager [phone]

## 2016-10-07 NOTE — Care Management Note (Addendum)
Case Management Note  Patient Details  Name: INDEA DEARMAN MRN: [id number] Date of Birth: 18-Jun-1955  Subjective/Objective:    Pt admitted with NSTEMI                  Action/Plan:   PTA from home with daughter - bil BKA.  Pt is active with [name] - and per [facility] pt has PCP that makes house visits.  Pt recently initiated [facility] services with RN, PT, and OT - agency is aware of admit.  Pt has had multiple admits but not a candidate for Home 1st program due to ongoing outpt HD.  CM will request [facility] resumption orders.  CM will continue to follow for discharge needs   Expected Discharge Date:                  Expected Discharge Plan:  [place]  In-House Referral:     Discharge planning Services  CM Consult  Post Acute Care Choice:    Choice offered to:     DME Arranged:    DME Agency:     HH Arranged:    HH Agency:     Status of Service:     If discussed at [employer], dates discussed:    Additional Comments:   CM contacted by pts daughter - daughter states that the readmits were not preventable and that she feels that she has all the support in the home that she needs - not interested in SNF.  CM will continue to follow for discharge needs Maryclare Labrador, RN 10/07/2016, 2:12 PM

## 2016-10-07 NOTE — Consult Note (Signed)
   [facility] Inpatient Consult   10/07/2016  [name] 1955/06/14 [id number]  Patient is currently active with [facility] for chronic disease management services. Chart review reveals the patient is admitted with a  Non-STEMI with a HX of s/p liver transplant for Hep C/cirhosis, and HX of systolic and diastolic HF, with bilateral BKA,, ESRD on HD MWF followed by [facility] ([facility]). Patient's primary Care Provider is Clovia Cuff, who provides home visits.  Patient has been engaged by a [employer].  Our community based plan of care has focused on disease management and community resource support. Met with the patient [resting but alert], a visitor, and [name] at the bedside. Patient gave permission to speak while family in the room. Daughter states the patient also has had follow up with [facility] for her therapy.  They consent to ongoing [facility] follow up.  Patient will receive a post discharge transition of care call and will be evaluated for monthly home visits for assessments and disease process education.  Made Inpatient Case Manager, Aldona Bar, aware that [facility] following. Of note, [facility] Care Management services does not replace or interfere with any services that are needed or arranged by inpatient case management or social work.  For additional questions or referrals please contact:  Natividad Brood, RN BSN [facility] Liaison  [phone] business mobile phone Toll free office [phone]

## 2016-10-07 NOTE — Progress Notes (Signed)
ANTICOAGULATION CONSULT NOTE - Follow Up Consult  Pharmacy Consult for coumadin  Indication: AVR  Allergies  Allergen Reactions  . Acetaminophen Other (See Comments)    Liver transplant recipient   . Morphine And Related Itching  . Codeine Itching  . Mirtazapine Other (See Comments)    hallucination    Patient Measurements: Height:  (Bilat BKA unable to assess) Weight: 102 lb 1.2 oz (46.3 kg)  Vital Signs: Temp: 98.2 F (36.8 C) (08/28 0753) Temp Source: Oral (08/28 0753) BP: 118/64 (08/28 0753) Pulse Rate: 78 (08/28 0753)  Labs:  Recent Labs  10/07/16 0300 10/07/16 0514  HGB  --  9.1*  HCT  --  26.9*  PLT  --  152  LABPROT  --  25.8*  INR  --  2.31  CREATININE  --  3.97*  TROPONINI 2.21* 1.95*    Estimated Creatinine Clearance: 10.7 mL/min (A) (by C-G formula based on SCr of 3.97 mg/dL (H)).  Assessment: 61yo female admitted for further w/u of CP, to continue Coumadin for AVR.  INR  Just below goal this morning at 2.3. No bleeding issues noted overnight. Will follow along with cardiology for possible cath as warfarin would need to be held.   Note that at last outpatient visit patient's INR was high at 5.2, it appears that she did not follow up with 1 week appointment. 2mg  daily dose was continued at that time.  Goal of Therapy:  INR 2.5-3.5 Monitor platelets by anticoagulation protocol: Yes   Plan:  Warfarin 4mg  tonight Follow up plans for cath Daily INR for now  Erin Hearing PharmD., BCPS Clinical Pharmacist Pager [phone] 10/07/2016 8:52 AM

## 2016-10-07 NOTE — Consult Note (Signed)
Consult Note  Patient Name: [name] Date of Encounter: 10/07/2016  Primary Cardiologist: [name]  Subjective   61 yo female with ESRD mwf dialysis, CAD s/p bypass LIMA to LAD, mechanical aortic valve, bilateral BKA, difficulty with access for dialysis, Hep C with liver transplant in 2011, COPD, depression.  She was transferred here from [facility] on the 19th of August for elevated troponin after a syncopal episode that occurred while the pt was in church.  She additionally said that she has been having intermittent chest pain for the last few weeks.  Dr. Marlou [name] was consulted on her and given her clinical picture felt that pt had an NSTEMI likely type 2, but given that the patient was not having active anginal symptoms and the potential harm for an invasive procedure he recommended medical management.     10/07/16 patient resting comfortably before interview, denies any active chest pain.  She had some chest pain yesterday that was a little worse than usual and lasted about an hour so she decided to come in..  States she has about 2-3 episodes per day of chest pain described as pressure w/pain in her epigastric region left side greater than right, the pain is non radiating, is worse with exertion, better with rest and nitroglycerin.  She denies diaphoreses or pain radiating beyond epigastric region.  She does report nausea and occasional vomiting but could not pinpoint this to be related to the pain.  Since her syncopal episode about 1 week ago she has not had any further syncope.  She had dialysis yesterday and said her bp kept dropping and she had mild chest pain during.  Inpatient Medications    Scheduled Meds: . aspirin EC  81 mg Oral Q1200  . [START ON 10/08/2016] calcitRIOL  0.5 mcg Oral Q M,W,F-HD  . calcium acetate  667 mg Oral TID WC  . carvedilol  3.125 mg Oral BID WC  . feeding supplement  1 Container Oral TID BM  . levothyroxine  200 mcg Oral QAC breakfast  .  midodrine  10 mg Oral TID WC  . mirtazapine  15 mg Oral Daily  . tacrolimus  2 mg Oral BID  . traZODone  50 mg Oral QHS  . warfarin  4 mg Oral ONCE-1800  . Warfarin - Pharmacist Dosing Inpatient   Does not apply q1800   Continuous Infusions:  PRN Meds: ALPRAZolam, nitroGLYCERIN, ondansetron (ZOFRAN) IV, oxyCODONE   Vital Signs    Vitals:   10/07/16 0000 10/07/16 0447 10/07/16 0753  BP: 117/62 (!) 104/49 118/64  Pulse: 69 64 78  Resp: 15 (!) 22 (!) 26  Temp: 97.6 F (36.4 C) 97.9 F (36.6 C) 98.2 F (36.8 C)  TempSrc: Oral Oral Oral  SpO2: 100% 99%   Weight:  46.3 kg (102 lb 1.2 oz)     Intake/Output Summary (Last 24 hours) at 10/07/16 0924 Last data filed at 10/07/16 0919  Gross per 24 hour  Intake                0 ml  Output                0 ml  Net                0 ml   Filed Weights   10/07/16 0447  Weight: 46.3 kg (102 lb 1.2 oz)    Telemetry    Sinus rhythm, occassional pvcs - Personally Reviewed  ECG  Sinus rhythm, Left axis deviation, anterior infarct likely old, nonspecific ST changes minimally changed from last ECG on 09/29/16 - Personally Reviewed  Physical Exam   GEN: No acute distress.   Neck:JVD 2cm above clavicle Cardiac: RRR, mechanical S2 , no rubs, no murmurs Respiratory: Clear to auscultation bilaterally. GI: Soft, nontender, non-distended  MS: No edema; bilateral bka. Neuro:  Nonfocal  Psych: Normal affect   Labs    Chemistry Recent Labs Lab 10/01/16 0719 10/07/16 0514  NA 132* 133*  K 3.7 4.3  CL 97* 100*  CO2 26 22  GLUCOSE 209* 86  BUN 28* 12  CREATININE 5.11* 3.97*  CALCIUM 8.1* 8.2*  ALBUMIN 1.5*  --   GFRNONAA 8* 11*  GFRAA 10* 13*  ANIONGAP 9 11     Hematology Recent Labs Lab 10/01/16 0718 10/02/16 0244 10/07/16 0514  WBC 8.4 7.1 6.8  RBC 3.53* 3.17* 3.09*  HGB 10.2* 9.3* 9.1*  HCT 30.1* 27.4* 26.9*  MCV 85.3 86.4 87.1  MCH 28.9 29.3 29.4  MCHC 33.9 33.9 33.8  RDW 17.3* 17.7* 17.8*  PLT 97* 77*  152    Cardiac Enzymes Recent Labs Lab 10/07/16 0300 10/07/16 0514  TROPONINI 2.21* 1.95*   No results for input(s): TROPIPOC in the last 168 hours.   BNPNo results for input(s): BNP, PROBNP in the last 168 hours.   DDimer No results for input(s): DDIMER in the last 168 hours.   Radiology    No results found.  Cardiac Studies   TTE 09/30/16 - Normal LV size with EF 30-35%. Wall motion abnormalities   suggestive of multivessel CAD. Moderate diastolic dysfunction.   normal RV size with mildly decreased systolic function.   Mechanical aortic valve with mild perivalvular regurgitation.   Mild mitral stenosis and mitral regurgitation. .   Patient Profile     61 y.o. female w/ ESRD mwf dialysis, CAD s/p bypass LIMA to LAD, mechanical aortic valve, bilateral BKA, difficulty with access for dialysis, Hep C with liver transplant in 2011, COPD, depression. Presents with intermittent chest pain occurring for the last few weeks with "a bad episode yesterday" causing pt to return.  Was admitted one week ago for NSTEMI and recent syncopal episode.     Assessment & Plan    Unstable angina: Intermittent, longest episode has been one hour at home and pt is out of nitroglycerin.  I believe the pt is having ischemic episodes that are causing her pain but I am not convinced it is due to thrombosis, it is more likely related to the patients existing coronary disease allowing for poor reserve in the setting of hypotensive episodes (pts daughter tells me that the pt has been having very low blood pressures at home sometimes 70/40), and her poor cardiac function.  Troponins still elevated but pt is ESRD and had NSTEMI event only one week ago, with likely ongoing troponin leak.    -With the patients difficult coronary anatomy and existing comorbidities I am unsure that invasive intervention to open up her RPDA would be worth the risk  -Recommend continued medical management and important to refill pts  SL nitroglycerin prescription upon discharge. -Will switch carvedilol to low dose metoprolol due to hypotensive episodes at home will begin with metoprolol 12.5mg  BID and see if BP will tolerate.    Chronic HFrEF: EF 30-35% on last admission 1 wk ago  -will try and switch pt to 12.5mg  metoprolol BID as tolerated, holding before dialysis on dialysis days.  ESRD: MWF  dialysis  -hold metoprolol for dialysis -pt has some JVD on exam, no Rales, unsure of how much fluid they were able to remove yesterday due to pt having hypotension.   -Continue as tolerated  Signed, [name] Muff, MD  10/07/2016, 9:24 AM    Patient seen and examined. Agree with assessment and plan.  [name] is a 61 year old African American female who is well-known to me.  She has end-stage renal disease and is on chronic hemodialysis.  After undergoing liver transplantation [facility] for hepatitis C in January 2015, she was found to have severe aortic stenosis and LAD stenosis and underwent mechanical aortic valve replacement and single-vessel LIMA to LAD CABG revascularization surgery by Dr. [name] in February 2015.  The last several years she has had issues with AV graft difficulty, status post left kidney nephrectomy in 2017, and  developed progressive CAD and PVD.  She is now status post bilateral below the knee amputations..  At last catheterization in July 2018 she was found to have a patent LIMA to LAD, but had significant LAD and diagonal disease  proximal to the graft insertion, new occlusion of her OM2 and OM3 branches of her circumflex, and distal RCA disease involving the PDA and inferior LV branch of 90 and 80%.  This has been treated medically.  She has continued to have difficulty with hypotension, particularly on dialysis days and as result, has not been able to be titrated with medical therapy for ischemia.  Following her July catheterization, her metoprolol was changed to carvedilol.  However, she  was admitted last week following a syncopal spell and was found to be hypotensive.  She is not active and remains sedentary.  Her ECG does not show acute ST segment changes, but shows sinus rhythm at 77 bpm, with QS complex in V1 and V2, and previously noted T-wave abnormality in leads 1 and L, and minimally increased T-wave changes in V4 through V6 compared to previously.  Presently, she is not having chest pain.  She had undergone her last dialysis yesterday.  Her blood pressure today is stable and she is not hypotensive.  Will attempt to reinstitute metoprolol tartrate at 12.5 mg twice a on nondialysis days.  She has been on Midodrin for low blood pressure and at present does not appear to be a candidate for initiation of amlodipine and or daily nitrate therapy.  With her end-stage renal disease, she is not be a candidate for ranolazine.  Agree with continued medical therapy approach rather than intervention.  Will follow.  [name] Sine, MD, [facility] 10/07/2016 3:29 PM

## 2016-10-07 NOTE — Progress Notes (Signed)
ANTICOAGULATION CONSULT NOTE - Initial Consult  Pharmacy Consult for Coumadin Indication: AVR  Allergies  Allergen Reactions  . Acetaminophen Other (See Comments)    Liver transplant recipient   . Codeine Itching  . Mirtazapine Other (See Comments)    hallucination    Vital Signs: Temp: 97.9 F (36.6 C) (08/28 0447) Temp Source: Oral (08/28 0447) BP: 104/49 (08/28 0447) Pulse Rate: 64 (08/28 0447)   Medical History: Past Medical History:  Diagnosis Date  . Anemia    takes Folic Acid daily  . Anxiety   . Arthritis    "left hand, back" (08/30/2012)  . Asthma   . CAD (coronary artery disease) Jan. 2015   Cath: 20% LAD, 50% D1; s/p LIMA-LAD  . Chest pain 03/06/2015  . CHF (congestive heart failure) ([place]) 05/2016  . Chronic back pain   . Chronic constipation    takes MIralax and Colace daily  . COPD (chronic obstructive pulmonary disease) ([place])   . Depression    takes Cymbalta for "severe" depression  . ESRD on dialysis [facility])    "MWF; [facility]" (08/12/2016)  . GERD (gastroesophageal reflux disease)    takes Omeprazole daily  . Headache    "at least monthly" (08/30/2012)  . Hepatitis C   . History of blood transfusion    "several" (08/30/2012)  . Hypertension    takes Metoprolol and Lisinopril daily, sees Dr Bea Graff  . Hypothyroidism    takes Synthroid daily  . Migraine    "last migraine was in 2013" (08/30/2012)  . Neuromuscular disorder ([place])    carpal tunnel in right hand  . Obesity   . Peripheral vascular disease (HCC) hands and legs  . Pneumonia    "today and several times before" (08/30/2012)  . 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 S/P bilateral BKA (below knee amputation) ([place]) 04/30/2016  . 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 SVT (supraventricular tachycardia) ([place]) 06/09/14  . Tobacco abuse   . Type II diabetes mellitus (HCC)    Levemir 2units daily if  > 150    Medications:  Prescriptions Prior to Admission  Medication Sig Dispense Refill Last Dose  . ALPRAZolam (XANAX) 0.5 MG tablet Take 0.5 mg by mouth 3 (three) times daily as needed for anxiety.    09/28/2016 at Unknown time  . Amino Acids-Protein Hydrolys (FEEDING SUPPLEMENT, PRO-STAT SUGAR FREE 64,) LIQD Take 30 mLs by mouth 2 (two) times daily. (Patient not taking: Reported on 09/18/2016) 900 mL 0 Not Taking at Unknown time  . aspirin EC 81 MG tablet Take 81 mg by mouth daily at 12 noon.    09/28/2016 at Unknown time  . calcitRIOL (ROCALTROL) 0.5 MCG capsule Take 1 capsule (0.5 mcg total) by mouth every Monday, Wednesday, and Friday with hemodialysis. (Patient not taking: Reported on 09/29/2016) 30 capsule 1 Not Taking at Unknown time  . calcium acetate (PHOSLO) 667 MG capsule Take 1 capsule (667 mg total) by mouth 3 (three) times daily with meals. (Patient not taking: Reported on 09/29/2016) 90 capsule 1 Not Taking at Unknown time  . carvedilol (COREG) 3.125 MG tablet Take 1 tablet (3.125 mg total) by mouth 2 (two) times daily with a meal. 60 tablet 3 09/28/2016 at 1030  . feeding supplement (BOOST / RESOURCE BREEZE) LIQD Take 1 Container by mouth 3 (three) times daily between meals. 1 Container  100 Unknown at Unknown  . levothyroxine (SYNTHROID, LEVOTHROID) 200 MCG tablet Take 1 tablet (200 mcg total) by mouth daily before breakfast. 30 tablet 1   . methocarbamol (ROBAXIN) 500 MG tablet Take 0.5 tablets (250 mg total) by mouth every 6 (six) hours as needed for muscle spasms. 60 tablet 0 Past Week at Unknown time  . metoCLOPramide (REGLAN) 5 MG tablet Take 1 tablet (5 mg total) by mouth 2 (two) times daily before a meal. Decrease to once a day before breakfast after a couple of weeks. 60 tablet 0 09/28/2016 at Unknown time  . midodrine (PROAMATINE) 10 MG tablet Take 1 tablet (10 mg total) by mouth 3 (three) times daily with meals. 90 tablet 1   . mirtazapine (REMERON) 15 MG tablet Take 15 mg by  mouth daily.  2 09/28/2016 at Unknown time  . multivitamin (RENA-VIT) TABS tablet Take 1 tablet by mouth at bedtime. (Patient not taking: Reported on 08/27/2016) 30 tablet 0 Not Taking at Unknown time  . nitroGLYCERIN (NITROSTAT) 0.4 MG SL tablet Place 1 tablet (0.4 mg total) under the tongue every 5 (five) minutes as needed for chest pain. 30 tablet 0 PRN  . ondansetron (ZOFRAN ODT) 8 MG disintegrating tablet 8mg  ODT q4 hours prn nausea (Patient taking differently: Take 8 mg by mouth every 4 (four) hours as needed for nausea or vomiting. ) 6 tablet 0 PRN  . oxyCODONE (OXY IR/ROXICODONE) 5 MG immediate release tablet Take 1 tablet (5 mg total) by mouth every 4 (four) hours as needed for severe pain. 12 tablet 0 PRN  . tacrolimus (PROGRAF) 1 MG capsule Take 2 mg by mouth 2 (two) times daily.    09/28/2016 at Unknown time  . traZODone (DESYREL) 50 MG tablet Take 50 mg by mouth at bedtime.   Past Week at Unknown time  . warfarin (COUMADIN) 2 MG tablet Take 1-2 tablets by mouth daily as directed by coumadin clinic 90 tablet 1    Scheduled:  . aspirin EC  81 mg Oral Q1200  . [START ON 10/08/2016] calcitRIOL  0.5 mcg Oral Q M,W,F-HD  . calcium acetate  667 mg Oral TID WC  . carvedilol  3.125 mg Oral BID WC  . feeding supplement  1 Container Oral TID BM  . levothyroxine  200 mcg Oral QAC breakfast  . midodrine  10 mg Oral TID WC  . mirtazapine  15 mg Oral Daily  . tacrolimus  2 mg Oral BID  . traZODone  50 mg Oral QHS  . Warfarin - Pharmacist Dosing Inpatient   Does not apply q1800    Assessment: 61yo female admitted for further w/u of CP, to continue Coumadin for AVR.  Goal of Therapy:  INR 2.5-3.5   Plan:  Will adjust Coumadin doses per daily INR.  Wynona Neat, PharmD, BCPS  10/07/2016,5:01 AM

## 2016-10-07 NOTE — Progress Notes (Signed)
Progress Note    [name]  GEX:[id number] DOB: 1955/12/27  DOA: 10/06/2016 PCP: Clovia Cuff, MD    Brief Narrative:   Chief complaint: F/U NSTEMI  Medical records reviewed and are as summarized below:  [name] is an 61 y.o. female with a PMH of ESRD on HD MWF, history of hepatitis C/cirrhosis status post liver transplant on chronic immunosuppressants with Prograf, chronic systolic and diastolic CHF, EF 01-02 % and grade 2 diastolic dysfunction, bilateral BKA who was admitted 10/06/16 for evaluation of chest pain. Of note, the patient was recently hospitalized 09/28/16-10/02/16 for treatment of acute metabolic encephalopathy and syncope and was noted to have elevated troponin/NSTEMI during that admission with medical management recommended.  Assessment/Plan:   Principal Problem:   NSTEMI (non-ST elevated myocardial infarction) (HCC)/CAD Troponin elevation consistent with non-STEMI. Cardiology consulted. The patient is already anticoagulated with Coumadin. Continue aspirin and nitroglycerin as needed. Continue Coreg. LDL 119, but statin therapy may increase Prograf levels.  Active Problems:   Hypothyroidism Continue levothyroxine.    Type 2 diabetes mellitus with chronic kidney disease ([place]) Appears to be diet controlled.    Anemia due to chronic renal failure  Hemoglobin stable at 9.1 mg/dL.    End stage renal disease on dialysis (HCC)/metabolic bone disease/secondary hyperparathyroidism HD per nephrology. Continue calcitriol and PhosLo.    Chronic combined systolic and diastolic congestive heart failure (HCC) EF 30-35 percent/grade 2 diastolic dysfunction by echo done 09/30/16.    COPD (chronic obstructive pulmonary disease) (HCC)    S/P liver transplant (HCC) Continue Prograf.    Status post aortic valve replacement with mechanical valve Anticoagulated with Coumadin. INR 2.31.    Hyponatremia Mild. Monitor.   Family Communication/Anticipated D/C date  and plan/Code Status   DVT prophylaxis: Lovenox ordered. Code Status: Full Code.  Family Communication: Family updated at bedside. Disposition Plan: Home in 24 hours if she remains chest pain free.   Medical Consultants:    Cardiology   Anti-Infectives:    None  Subjective:   Patient denies current active chest pain. No shortness of breath. No nausea or vomiting.  Objective:    Vitals:   10/07/16 0000 10/07/16 0447  BP: 117/62 (!) 104/49  Pulse: 69 64  Resp: 15 (!) 22  Temp: 97.6 F (36.4 C) 97.9 F (36.6 C)  TempSrc: Oral Oral  SpO2: 100% 99%  Weight:  46.3 kg (102 lb 1.2 oz)   No intake or output data in the 24 hours ending 10/07/16 0724 Filed Weights   10/07/16 0447  Weight: 46.3 kg (102 lb 1.2 oz)    Exam: General: No acute distress. Cardiovascular: Heart sounds show a regular rate, and rhythm. No gallops or rubs. No murmurs. Mild JVD. Lungs: Clear to auscultation bilaterally with good air movement. No rales, rhonchi or wheezes. Abdomen: Soft, nontender, nondistended with normal active bowel sounds. No masses. No hepatosplenomegaly. Neurological: Alert and oriented 3. Moves all extremities 4 with equal strength. Cranial nerves II through XII grossly intact. Skin: Warm and dry. No rashes or lesions. Extremities: Bilateral BKA. Psychiatric: Mood and affect are normal. Insight and judgment are good.   Data Reviewed:   I have personally reviewed following labs and imaging studies:  Labs: Labs show the following:   Basic Metabolic Panel:  Recent Labs Lab 10/01/16 0719 10/07/16 0514  NA 132* 133*  K 3.7 4.3  CL 97* 100*  CO2 26 22  GLUCOSE 209* 86  BUN 28* 12  CREATININE 5.11*  3.97*  CALCIUM 8.1* 8.2*  PHOS 5.5*  --    GFR Estimated Creatinine Clearance: 10.7 mL/min (A) (by C-G formula based on SCr of 3.97 mg/dL (H)). Liver Function Tests:  Recent Labs Lab 10/01/16 0719  ALBUMIN 1.5*   Coagulation profile  Recent Labs Lab  10/01/16 0718 10/01/16 1226 10/02/16 0244 10/07/16 0514  INR >10.00* 4.89* 3.79 2.31    CBC:  Recent Labs Lab 10/01/16 0718 10/02/16 0244 10/07/16 0514  WBC 8.4 7.1 6.8  HGB 10.2* 9.3* 9.1*  HCT 30.1* 27.4* 26.9*  MCV 85.3 86.4 87.1  PLT 97* 77* 152   Cardiac Enzymes:  Recent Labs Lab 10/07/16 0300 10/07/16 0514  TROPONINI 2.21* 1.95*   CBG:  Recent Labs Lab 10/01/16 1137 10/01/16 1649 10/01/16 2051 10/02/16 0749 10/02/16 1156  GLUCAP 65 277* 218* 107* 221*   Lipid Profile:  Recent Labs  10/07/16 0300  CHOL 169  HDL 33*  LDLCALC 119*  TRIG 86  CHOLHDL 5.1   Sepsis Labs:  Recent Labs Lab 10/01/16 0718 10/02/16 0244 10/07/16 0514  WBC 8.4 7.1 6.8    Microbiology Recent Results (from the past 240 hour(s))  MRSA PCR Screening     Status: None   Collection Time: 09/28/16 11:43 PM  Result Value Ref Range Status   MRSA by PCR NEGATIVE NEGATIVE Final    Comment:        The GeneXpert MRSA Assay (FDA approved for NASAL specimens only), is one component of a comprehensive MRSA colonization surveillance program. It is not intended to diagnose MRSA infection nor to guide or monitor treatment for MRSA infections.   MRSA PCR Screening     Status: None   Collection Time: 10/07/16  4:15 AM  Result Value Ref Range Status   MRSA by PCR NEGATIVE NEGATIVE Final    Comment:        The GeneXpert MRSA Assay (FDA approved for NASAL specimens only), is one component of a comprehensive MRSA colonization surveillance program. It is not intended to diagnose MRSA infection nor to guide or monitor treatment for MRSA infections.     Procedures and diagnostic studies:  No results found.  Medications:   . aspirin EC  81 mg Oral Q1200  . [START ON 10/08/2016] calcitRIOL  0.5 mcg Oral Q M,W,F-HD  . calcium acetate  667 mg Oral TID WC  . carvedilol  3.125 mg Oral BID WC  . feeding supplement  1 Container Oral TID BM  . levothyroxine  200 mcg Oral  QAC breakfast  . midodrine  10 mg Oral TID WC  . mirtazapine  15 mg Oral Daily  . tacrolimus  2 mg Oral BID  . traZODone  50 mg Oral QHS  . Warfarin - Pharmacist Dosing Inpatient   Does not apply q1800   Continuous Infusions:   LOS: 1 day   Averyana Pillars  [facility] Pager [phone]. If unable to reach me by pager, please call my cell phone at [phone].  *Please refer to [email or web address], password [email or web address] to get updated schedule on who will round on this patient, as hospitalists switch teams weekly. If 7PM-7AM, please contact night-coverage at [email or web address], password [email or web address] for any overnight needs.  10/07/2016, 7:24 AM

## 2016-10-08 DIAGNOSIS — N185 Chronic kidney disease, stage 5: Secondary | ICD-10-CM

## 2016-10-08 DIAGNOSIS — I472 Ventricular tachycardia: Secondary | ICD-10-CM

## 2016-10-08 DIAGNOSIS — D631 Anemia in chronic kidney disease: Secondary | ICD-10-CM

## 2016-10-08 DIAGNOSIS — I4729 Other ventricular tachycardia: Secondary | ICD-10-CM

## 2016-10-08 LAB — RENAL FUNCTION PANEL
Albumin: 1.6 g/dL — ABNORMAL LOW (ref 3.5–5.0)
Anion gap: 9 (ref 5–15)
BUN: 28 mg/dL — AB (ref 6–20)
CALCIUM: 8.1 mg/dL — AB (ref 8.9–10.3)
CHLORIDE: 97 mmol/L — AB (ref 101–111)
CO2: 26 mmol/L (ref 22–32)
CREATININE: 6.31 mg/dL — AB (ref 0.44–1.00)
GFR calc Af Amer: 7 mL/min — ABNORMAL LOW (ref >60)
GFR, EST NON AFRICAN AMERICAN: 6 mL/min — AB (ref >60)
Glucose, Bld: 363 mg/dL — ABNORMAL HIGH (ref 65–99)
Phosphorus: 7.6 mg/dL — ABNORMAL HIGH (ref 2.5–4.6)
Potassium: 4.2 mmol/L (ref 3.5–5.1)
SODIUM: 132 mmol/L — AB (ref 135–145)

## 2016-10-08 LAB — PROTIME-INR
INR: 2.44
PROTHROMBIN TIME: 26.3 s — AB (ref 11.4–15.2)

## 2016-10-08 LAB — HEMOGLOBIN AND HEMATOCRIT, BLOOD
HCT: 26.9 % — ABNORMAL LOW (ref 36.0–46.0)
HEMOGLOBIN: 9 g/dL — AB (ref 12.0–15.0)

## 2016-10-08 MED ORDER — WARFARIN SODIUM 4 MG PO TABS
4.0000 mg | ORAL_TABLET | Freq: Once | ORAL | Status: AC
  Administered 2016-10-08: 4 mg via ORAL
  Filled 2016-10-08: qty 1

## 2016-10-08 MED ORDER — DARBEPOETIN ALFA 100 MCG/0.5ML IJ SOSY
100.0000 ug | PREFILLED_SYRINGE | INTRAMUSCULAR | Status: DC
  Administered 2016-10-09: 100 ug via INTRAVENOUS
  Filled 2016-10-08 (×2): qty 0.5

## 2016-10-08 MED ORDER — MAGNESIUM SULFATE IN D5W 1-5 GM/100ML-% IV SOLN
1.0000 g | Freq: Once | INTRAVENOUS | Status: AC
  Administered 2016-10-08: 1 g via INTRAVENOUS
  Filled 2016-10-08: qty 100

## 2016-10-08 NOTE — Progress Notes (Signed)
Progress Note    ELLARAE NEVITT  QPY:[id number] DOB: February 11, 1955  DOA: 10/06/2016 PCP: Clovia Cuff, MD    Subjective:   No chest pain, seen with nursing staff at bedside. HD to be done later today. Likely can be discharged in a.m. if no chest pain  Brief Narrative:   Chief complaint: F/U NSTEMI  Medical records reviewed and are as summarized below:  ROYANNE WARSHAW is an 61 y.o. female with a PMH of ESRD on HD MWF, history of hepatitis C/cirrhosis status post liver transplant on chronic immunosuppressants with Prograf, chronic systolic and diastolic CHF, EF 12-45 % and grade 2 diastolic dysfunction, bilateral BKA who was admitted 10/06/16 for evaluation of chest pain. Of note, the patient was recently hospitalized 09/28/16-10/02/16 for treatment of acute metabolic encephalopathy and syncope and was noted to have elevated troponin/NSTEMI during that admission with medical management recommended.  Assessment/Plan:     NSTEMI (non-ST elevated myocardial infarction) (HCC)/CAD Troponin elevation consistent with non-STEMI. Cardiology consulted. The patient is already anticoagulated with Coumadin. Continue aspirin and nitroglycerin as needed. LDL 119, but statin therapy may increase Prograf levels. -Recent cardiac cath on 08/12/2016 showed diffuse CAD, cannot do Ranexa because of ESRD. -Coreg switched to metoprolol, continue aspirin and nitroglycerin as needed    Hypothyroidism -Recent TSH is elevated at 16.599, patient is on 200 g of levothyroxine, continued.    Type 2 diabetes mellitus with chronic kidney disease ([place]) Appears to be diet controlled.    Anemia due to chronic renal failure  Hemoglobin stable at 9.1 mg/dL.    End stage renal disease on dialysis (HCC)/metabolic bone disease/secondary hyperparathyroidism HD per nephrology. Continue calcitriol and PhosLo.    Chronic combined systolic and diastolic congestive heart failure (HCC) EF 30-35 percent/grade 2 diastolic  dysfunction by echo done 09/30/16. Volume control through dialysis.    COPD (chronic obstructive pulmonary disease) (HCC)    S/P liver transplant (HCC) Continue Prograf.    Status post aortic valve replacement with mechanical valve Anticoagulated with Coumadin. INR 2.31.    Hyponatremia Mild. Monitor.   Family Communication/Anticipated D/C date and plan/Code Status   DVT prophylaxis: Lovenox ordered. Code Status: Full Code.  Family Communication: No family at bedside. Disposition Plan: Home in AM if she remains chest pain free.   Medical Consultants:    Cardiology   Anti-Infectives:    None  Objective:    Vitals:   10/07/16 2300 10/08/16 0353 10/08/16 0757 10/08/16 1214  BP: (!) 103/59 (!) 93/54 101/61 (!) 100/47  Pulse:      Resp: 17 20 15  (!) 23  Temp: 98.2 F (36.8 C) 98.7 F (37.1 C) 98.6 F (37 C) 98.6 F (37 C)  TempSrc: Oral Oral Oral Oral  SpO2:   96%   Weight:        Intake/Output Summary (Last 24 hours) at 10/08/16 1234 Last data filed at 10/08/16 1100  Gross per 24 hour  Intake              939 ml  Output                0 ml  Net              939 ml   Filed Weights   10/07/16 0447  Weight: 46.3 kg (102 lb 1.2 oz)    Exam: General: No acute distress. Cardiovascular: Heart sounds show a regular rate, and rhythm. No gallops or rubs. No murmurs. Mild JVD. Lungs:  Clear to auscultation bilaterally with good air movement. No rales, rhonchi or wheezes. Abdomen: Soft, nontender, nondistended with normal active bowel sounds. No masses. No hepatosplenomegaly. Neurological: Alert and oriented 3. Moves all extremities 4 with equal strength. Cranial nerves II through XII grossly intact. Skin: Warm and dry. No rashes or lesions. Extremities: Bilateral BKA. Psychiatric: Mood and affect are normal. Insight and judgment are good.   Data Reviewed:   I have personally reviewed following labs and imaging studies:  Labs: Labs show the following:    Basic Metabolic Panel:  Recent Labs Lab 10/07/16 0514  NA 133*  K 4.3  CL 100*  CO2 22  GLUCOSE 86  BUN 12  CREATININE 3.97*  CALCIUM 8.2*   GFR Estimated Creatinine Clearance: 10.7 mL/min (A) (by C-G formula based on SCr of 3.97 mg/dL (H)). Liver Function Tests: No results for input(s): AST, ALT, ALKPHOS, BILITOT, PROT, ALBUMIN in the last 168 hours. Coagulation profile  Recent Labs Lab 10/02/16 0244 10/07/16 0514 10/08/16 0246  INR 3.79 2.31 2.44    CBC:  Recent Labs Lab 10/02/16 0244 10/07/16 0514  WBC 7.1 6.8  HGB 9.3* 9.1*  HCT 27.4* 26.9*  MCV 86.4 87.1  PLT 77* 152   Cardiac Enzymes:  Recent Labs Lab 10/07/16 0300 10/07/16 0514  TROPONINI 2.21* 1.95*   CBG:  Recent Labs Lab 10/01/16 1649 10/01/16 2051 10/02/16 0749 10/02/16 1156  GLUCAP 277* 218* 107* 221*   Lipid Profile:  Recent Labs  10/07/16 0300  CHOL 169  HDL 33*  LDLCALC 119*  TRIG 86  CHOLHDL 5.1   Sepsis Labs:  Recent Labs Lab 10/02/16 0244 10/07/16 0514  WBC 7.1 6.8    Microbiology Recent Results (from the past 240 hour(s))  MRSA PCR Screening     Status: None   Collection Time: 09/28/16 11:43 PM  Result Value Ref Range Status   MRSA by PCR NEGATIVE NEGATIVE Final    Comment:        The GeneXpert MRSA Assay (FDA approved for NASAL specimens only), is one component of a comprehensive MRSA colonization surveillance program. It is not intended to diagnose MRSA infection nor to guide or monitor treatment for MRSA infections.   MRSA PCR Screening     Status: None   Collection Time: 10/07/16  4:15 AM  Result Value Ref Range Status   MRSA by PCR NEGATIVE NEGATIVE Final    Comment:        The GeneXpert MRSA Assay (FDA approved for NASAL specimens only), is one component of a comprehensive MRSA colonization surveillance program. It is not intended to diagnose MRSA infection nor to guide or monitor treatment for MRSA infections.     Procedures  and diagnostic studies:  No results found.  Medications:   . aspirin EC  81 mg Oral Q1200  . calcitRIOL  0.5 mcg Oral Q M,W,F-HD  . calcium acetate  667 mg Oral TID WC  . [START ON 10/09/2016] darbepoetin (ARANESP) injection - DIALYSIS  100 mcg Intravenous Q Thu-HD  . feeding supplement  1 Container Oral TID BM  . levothyroxine  200 mcg Oral QAC breakfast  . midodrine  10 mg Oral TID WC  . mirtazapine  15 mg Oral Daily  . tacrolimus  2 mg Oral BID  . traZODone  50 mg Oral QHS  . warfarin  4 mg Oral ONCE-1800  . Warfarin - Pharmacist Dosing Inpatient   Does not apply q1800   Continuous Infusions:   LOS: 2  days   Birdie Hopes, MD [facility] Pager [phone]  *Please refer to [email or web address], password [email or web address] to get updated schedule on who will round on this patient, as hospitalists switch teams weekly. If 7PM-7AM, please contact night-coverage at [email or web address], password [email or web address] for any overnight needs.  10/08/2016, 12:34 PM

## 2016-10-08 NOTE — Progress Notes (Signed)
Paged NP regarding pt having 14 beat run of vtach

## 2016-10-08 NOTE — Progress Notes (Signed)
Subjective:  Admitted with Chest pain / Just dc 10/02/16 with Presyncope/ hypotension - due to worsening heart failure/ worsening hypothyroidism. Seen by  cardiology  for advancing CHF. Had nl EF last Oct but has been steadily declining  now down to 20-25%.  On low dose coreg for syst HF. Started midodrine here 10 mg tid which seems to have helped last admit. BUT PT And Daughter not aware to take Midodrine BEFORE HD   Now readmitted with Chest pain,  troponin 2.21 / 100% rm air o2 sat on admit .   Currently no pain , no sob  Eating brk . For hd today   Objective Vital signs in last 24 hours: Vitals:   10/07/16 1946 10/07/16 2300 10/08/16 0353 10/08/16 0757  BP: 105/61 (!) 103/59 (!) 93/54 101/61  Pulse:      Resp: 13 17 20 15   Temp: 98.3 F (36.8 C) 98.2 F (36.8 C) 98.7 F (37.1 C) 98.6 F (37 C)  TempSrc: Oral Oral Oral Oral  SpO2:      Weight:       Weight change:   Physical Exam: General: thin , chronically ill Elderly AAF, NAD, eating brk , NAD , OX4 ,pleasant  Heart: RRR no mur , rub or gallop Lungs: CTA  bilat  Abdomen: BS pos soft , Nt, nd Extremities:Bilat Bkas, R with healing scab lesion   Dialysis Access:  R IJ  Perm cath    Dialysis: MWF [name] 3h 70min  44.5kg  3K/2.25 bath  R IJ TDC  Hep none -No ESA/Fe -Calcitriol 0.5 mcg PO TIW (Last PTH 342)   BMD meds: Calcium acetate 667 mg 2 caps PO TID AC   Problem/Plan: 1. ESRD - HD today on schedule ( MWF) 4.3 k  2. Unstable angina - cards seeing ,noted plan"' switch carvedilol to low dose metoprolol due to hypotensive episodes at home will begin with metoprolol 12.5mg  BID and see if BP will tolerate. ""  3. Chronic HF/ CM  EF 30-35%- on last week admit / on midodrien new since last week fro bp support on hd  / I Instructed Pt and Daughter to take Midodrine Before HD when dc  And reasoning why /Only 1 l uf  No significant  vol excess on exam  ( no cxr  I see in computer this admit) 4. Anemia of ESRD =HGB 9.1 , start   Aranesp 100 today (not on ESA currently  -  5. Secondary hyperparathyroidism -po vit d / phoslo as binder 6. DM type 2 7. Liver transplant - cont meds 8. Hx AVR 2015 - on coumadin/ 9. FTT in adult - 24 th admission this year 10.  DNR   Ernest Haber, PA-C [facility] [phone] 10/08/2016,9:32 AM  LOS: 2 days    Renal Attending  Pt with ESRD on HD MWF & liver transplant admitted with USAP and episodic hypotension.  We will support with HD.  I agree with note above. Deakin Lacek C   Labs: Basic Metabolic Panel:  Recent Labs Lab 10/07/16 0514  NA 133*  K 4.3  CL 100*  CO2 22  GLUCOSE 86  BUN 12  CREATININE 3.97*  CALCIUM 8.2*    CBC:  Recent Labs Lab 10/02/16 0244 10/07/16 0514  WBC 7.1 6.8  HGB 9.3* 9.1*  HCT 27.4* 26.9*  MCV 86.4 87.1  PLT 77* 152   Cardiac Enzymes:  Recent Labs Lab 10/07/16 0300 10/07/16 0514  TROPONINI 2.21* 1.95*  CBG:  Recent Labs Lab 10/01/16 1137 10/01/16 1649 10/01/16 2051 10/02/16 0749 10/02/16 1156  GLUCAP 65 277* 218* 107* 221*    Studies/Results: No results found. Medications:  . aspirin EC  81 mg Oral Q1200  . calcitRIOL  0.5 mcg Oral Q M,W,F-HD  . calcium acetate  667 mg Oral TID WC  . [START ON 10/09/2016] darbepoetin (ARANESP) injection - DIALYSIS  100 mcg Intravenous Q Thu-HD  . feeding supplement  1 Container Oral TID BM  . levothyroxine  200 mcg Oral QAC breakfast  . midodrine  10 mg Oral TID WC  . mirtazapine  15 mg Oral Daily  . tacrolimus  2 mg Oral BID  . traZODone  50 mg Oral QHS  . warfarin  4 mg Oral ONCE-1800  . Warfarin - Pharmacist Dosing Inpatient   Does not apply [phone]

## 2016-10-08 NOTE — Procedures (Signed)
She is sleeping on HD.  BP stable so far. Goal is set at 1000cc. No interventions needed. Madaleine Simmon C

## 2016-10-08 NOTE — Consult Note (Signed)
Consult Note  Patient Name: [name] Date of Encounter: 10/08/2016  Primary Cardiologist: Shelva Majestic  Subjective   61 yo female with ESRD mwf dialysis, CAD s/p bypass LIMA to LAD, mechanical aortic valve, bilateral BKA, difficulty with access for dialysis, Hep C with liver transplant in 2011, COPD, depression.  She was transferred here from [facility] on the 19th of August for elevated troponin after a syncopal episode that occurred while the pt was in church.  She additionally said that she has been having intermittent chest pain for the last few weeks.  Dr. Marlou Porch was consulted on her and given her clinical picture felt that pt had an NSTEMI likely type 2, but given that the patient was not having active anginal symptoms and the potential harm for an invasive procedure he recommended medical management.     10/08/16 patient resting comfortably before interview, denies any active chest pain.  She has had no chest pain since admission and states she felt she was overdoing it at home.  Pt denies nausea, vomiting, diaphoresis, light headedness or any notice of having low BP's saying she "prefers the metoprolol it doesn't take her too low".   Inpatient Medications    Scheduled Meds: . aspirin EC  81 mg Oral Q1200  . calcitRIOL  0.5 mcg Oral Q M,W,F-HD  . calcium acetate  667 mg Oral TID WC  . feeding supplement  1 Container Oral TID BM  . levothyroxine  200 mcg Oral QAC breakfast  . metoprolol tartrate  12.5 mg Oral BID  . midodrine  10 mg Oral TID WC  . mirtazapine  15 mg Oral Daily  . tacrolimus  2 mg Oral BID  . traZODone  50 mg Oral QHS  . warfarin  4 mg Oral ONCE-1800  . Warfarin - Pharmacist Dosing Inpatient   Does not apply q1800   Continuous Infusions:  PRN Meds: ALPRAZolam, nitroGLYCERIN, ondansetron (ZOFRAN) IV, oxyCODONE   Vital Signs    Vitals:   10/07/16 1946 10/07/16 2300 10/08/16 0353 10/08/16 0757  BP: 105/61 (!) 103/59 (!) 93/54 101/61  Pulse:       Resp: 13 17 20 15   Temp: 98.3 F (36.8 C) 98.2 F (36.8 C) 98.7 F (37.1 C) 98.6 F (37 C)  TempSrc: Oral Oral Oral Oral  SpO2:      Weight:        Intake/Output Summary (Last 24 hours) at 10/08/16 9326 Last data filed at 10/08/16 7124  Gross per 24 hour  Intake              699 ml  Output                0 ml  Net              699 ml   Filed Weights   10/07/16 0447  Weight: 46.3 kg (102 lb 1.2 oz)    Telemetry    Occasional episodes of SVT into the 140's during the night, occassional pvcs - Personally Reviewed  ECG    Sinus rhythm, Left axis deviation, anterior infarct likely old, nonspecific ST changes minimally changed from last ECG on 09/29/16 - Personally Reviewed  Physical Exam   GEN: No acute distress.   Neck:JVD 2cm above clavicle Cardiac: RRR, mechanical S2 , no rubs, no murmurs Respiratory: Clear to auscultation bilaterally. GI: Soft, nontender, non-distended  MS: No edema; bilateral bka. Neuro:  Nonfocal  Psych: Normal affect   Labs  Chemistry  Recent Labs Lab 10/07/16 0514  NA 133*  K 4.3  CL 100*  CO2 22  GLUCOSE 86  BUN 12  CREATININE 3.97*  CALCIUM 8.2*  GFRNONAA 11*  GFRAA 13*  ANIONGAP 11     Hematology  Recent Labs Lab 10/02/16 0244 10/07/16 0514  WBC 7.1 6.8  RBC 3.17* 3.09*  HGB 9.3* 9.1*  HCT 27.4* 26.9*  MCV 86.4 87.1  MCH 29.3 29.4  MCHC 33.9 33.8  RDW 17.7* 17.8*  PLT 77* 152    Cardiac Enzymes  Recent Labs Lab 10/07/16 0300 10/07/16 0514  TROPONINI 2.21* 1.95*   No results for input(s): TROPIPOC in the last 168 hours.   BNPNo results for input(s): BNP, PROBNP in the last 168 hours.   DDimer No results for input(s): DDIMER in the last 168 hours.   Radiology    No results found.  Cardiac Studies   TTE 09/30/16 - Normal LV size with EF 30-35%. Wall motion abnormalities   suggestive of multivessel CAD. Moderate diastolic dysfunction.   normal RV size with mildly decreased systolic function.    Mechanical aortic valve with mild perivalvular regurgitation.   Mild mitral stenosis and mitral regurgitation. .   Patient Profile     61 y.o. female w/ ESRD mwf dialysis, CAD s/p bypass LIMA to LAD, mechanical aortic valve, bilateral BKA, difficulty with access for dialysis, Hep C with liver transplant in 2011, COPD, depression. Presents with intermittent chest pain occurring for the last few weeks with "a bad episode yesterday" causing pt to return.  Was admitted one week ago for NSTEMI and recent syncopal episode.     Assessment & Plan    Unstable angina: Intermittent, longest episode has been one hour at home and pt is out of nitroglycerin.  I believe the pt is having ischemic episodes that are causing her pain but I am not convinced it is due to thrombosis, it is more likely related to the patients existing coronary disease allowing for poor reserve in the setting of hypotensive episodes (pts daughter tells me that the pt has been having very low blood pressures at home sometimes 70/40), and her poor cardiac function.  Troponins still elevated but pt is ESRD and had NSTEMI event only one week ago, with likely ongoing troponin leak.    -With the patients difficult coronary anatomy and existing comorbidities I am unsure that invasive intervention to open up her RPDA would be worth the risk  -Recommend continued medical management and important to refill pts SL nitroglycerin prescription upon discharge. -Pt tolerated metoprolol well from a symptomatic standpoint, did have short runs of svt during the night per pt this is common for her, she did not notice it, and one reported lower bp at 93/54 at 0353.  Will continue to use cautiously and not on dialysis days  Chronic HFrEF: EF 30-35% on last admission 1 wk ago  -will try and switch pt to 12.5mg  metoprolol BID as tolerated, holding before dialysis on dialysis days.  ESRD: MWF dialysis  -hold metoprolol for dialysis -pt has some JVD on exam,  no Rales, unsure of how much fluid they were able to remove during last dialysis due to pt having hypotension.   -Continue as tolerated  Signed, Vickki Muff, MD  10/08/2016, 9:27 AM       Patient seen and examined. Agree with assessment and plan. Feels much better with re-institution of metoprolol. No chest pain. No dyspnea. Much more alert today. Had a  10 beat episode of WCT at 03:43:14 this am; resolved spontaneously. Currently sinus rhythm without ectopy. In addition to above PE notable for JVD 8 cm; decreased BS no wheezing; 2/6 systolic murmur c/w mechanical AVR with regurgitation; abd soft with BS+; no LE edema s/p B BKAs. Not yet steady state with resumption of BB. BP stable. For dialysis later today.   Troy Sine, MD, [facility] 10/08/2016 1:25 PM

## 2016-10-08 NOTE — Progress Notes (Signed)
Patient went briefly into junctional tachy with HR in 120's. Asymptomatic. Denies CP. Cardiologist notified. Currently HR stable in the mid 70's. Will continue to monitor.

## 2016-10-08 NOTE — Progress Notes (Signed)
CRITICAL VALUE ALERT  Critical Value:  Creatinine 6.31  Date & Time Notied:  10/08/16 1452  Provider Notified: PA notified Orders Received/Actions taken: No new orders received at this time.

## 2016-10-08 NOTE — Progress Notes (Signed)
ANTICOAGULATION CONSULT NOTE - Follow Up Consult  Pharmacy Consult for coumadin  Indication: AVR  Allergies  Allergen Reactions  . Acetaminophen Other (See Comments)    Liver transplant recipient   . Morphine And Related Itching  . Codeine Itching  . Mirtazapine Other (See Comments)    hallucination    Patient Measurements: Height:  (Bilat BKA unable to assess) Weight: 102 lb 1.2 oz (46.3 kg)  Vital Signs: Temp: 98.6 F (37 C) (08/29 0757) Temp Source: Oral (08/29 0757) BP: 101/61 (08/29 0757)  Labs:  Recent Labs  10/07/16 0300 10/07/16 0514 10/08/16 0246  HGB  --  9.1*  --   HCT  --  26.9*  --   PLT  --  152  --   LABPROT  --  25.8* 26.3*  INR  --  2.31 2.44  CREATININE  --  3.97*  --   TROPONINI 2.21* 1.95*  --     Estimated Creatinine Clearance: 10.7 mL/min (A) (by C-G formula based on SCr of 3.97 mg/dL (H)).  Assessment: 61yo female admitted for further w/u of CP, to continue Coumadin for AVR.  INR trending up this morning but still just below goal at 2.4. No bleeding issues noted overnight. No interventions planned by cardiology at this time.  Goal of Therapy:  INR 2.5-3.5 Monitor platelets by anticoagulation protocol: Yes   Plan:  Repeat warfarin 4mg  again tonight Daily INR for now  Erin Hearing PharmD., BCPS Clinical Pharmacist Pager [phone] 10/08/2016 8:54 AM

## 2016-10-09 ENCOUNTER — Telehealth: Payer: Self-pay | Admitting: Pharmacist Clinician (PhC)/ Clinical Pharmacy Specialist

## 2016-10-09 DIAGNOSIS — J42 Unspecified chronic bronchitis: Secondary | ICD-10-CM

## 2016-10-09 DIAGNOSIS — E1151 Type 2 diabetes mellitus with diabetic peripheral angiopathy without gangrene: Secondary | ICD-10-CM | POA: Diagnosis not present

## 2016-10-09 DIAGNOSIS — I132 Hypertensive heart and chronic kidney disease with heart failure and with stage 5 chronic kidney disease, or end stage renal disease: Secondary | ICD-10-CM | POA: Diagnosis not present

## 2016-10-09 DIAGNOSIS — E1122 Type 2 diabetes mellitus with diabetic chronic kidney disease: Secondary | ICD-10-CM

## 2016-10-09 DIAGNOSIS — Z794 Long term (current) use of insulin: Secondary | ICD-10-CM

## 2016-10-09 LAB — PROTIME-INR
INR: 2.37
PROTHROMBIN TIME: 25.7 s — AB (ref 11.4–15.2)

## 2016-10-09 LAB — MAGNESIUM: Magnesium: 2.1 mg/dL (ref 1.7–2.4)

## 2016-10-09 MED ORDER — OXYCODONE HCL 5 MG PO TABS: 5.0000 mg | ORAL_TABLET | ORAL | 0 refills | Status: AC | PRN

## 2016-10-09 MED ORDER — LIDOCAINE 5 % EX PTCH: 1.0000 | MEDICATED_PATCH | CUTANEOUS | 0 refills | Status: AC

## 2016-10-09 MED ORDER — RANOLAZINE ER 500 MG PO TB12: 500.0000 mg | ORAL_TABLET | Freq: Every day | ORAL | 0 refills | Status: DC

## 2016-10-09 MED ORDER — RANOLAZINE ER 500 MG PO TB12
500.0000 mg | ORAL_TABLET | Freq: Every day | ORAL | Status: DC
  Administered 2016-10-09: 500 mg via ORAL
  Filled 2016-10-09: qty 1

## 2016-10-09 MED ORDER — METOPROLOL TARTRATE 12.5 MG HALF TABLET
12.5000 mg | ORAL_TABLET | Freq: Two times a day (BID) | ORAL | Status: DC
  Administered 2016-10-09: 12.5 mg via ORAL
  Filled 2016-10-09: qty 1

## 2016-10-09 MED ORDER — WARFARIN SODIUM 2 MG PO TABS: 2.0000 mg | ORAL_TABLET | Freq: Once | ORAL | Status: DC

## 2016-10-09 MED ORDER — HYDROMORPHONE HCL 2 MG PO TABS: 4.0000 mg | ORAL_TABLET | Freq: Four times a day (QID) | ORAL | Status: DC | PRN

## 2016-10-09 MED ORDER — LIDOCAINE 5 % EX PTCH
1.0000 | MEDICATED_PATCH | CUTANEOUS | Status: DC
  Administered 2016-10-09: 1 via TRANSDERMAL
  Filled 2016-10-09: qty 1

## 2016-10-09 MED ORDER — METOPROLOL TARTRATE 25 MG PO TABS: 12.5000 mg | ORAL_TABLET | Freq: Two times a day (BID) | ORAL | 0 refills | Status: AC

## 2016-10-09 NOTE — Telephone Encounter (Signed)
Opened in error

## 2016-10-09 NOTE — Progress Notes (Signed)
Pt discharged with prescriptions, copies of discharge forms, removed PIV, no signs of bleeding, daughter at beside. Wheelchair to private vehicle.

## 2016-10-09 NOTE — Consult Note (Signed)
Consult Note  Patient Name: [name] Date of Encounter: 10/09/2016  Primary Cardiologist: Shelva Majestic  Subjective   61 yo female with ESRD mwf dialysis, CAD s/p bypass LIMA to LAD, mechanical aortic valve, bilateral BKA, difficulty with access for dialysis, Hep C with liver transplant in 2011, COPD, depression.  She was transferred here from [facility] on the 19th of August for elevated troponin after a syncopal episode that occurred while the pt was in church.  She additionally said that she has been having intermittent chest pain for the last few weeks.  Dr. Marlou Porch was consulted on her and given her clinical picture felt that pt had an NSTEMI likely type 2, but given that the patient was not having active anginal symptoms and the potential harm for an invasive procedure he recommended medical management.     10/09/16 patient resting comfortably before interview, denies any active chest pain.  She has had no chest pain since admission.  Pt had dialysis yesterday afternoon and reports no chest pain or low bps during. Pt denies nausea, vomiting, diaphoresis, light headedness,    Inpatient Medications    Scheduled Meds: . aspirin EC  81 mg Oral Q1200  . calcitRIOL  0.5 mcg Oral Q M,W,F-HD  . calcium acetate  667 mg Oral TID WC  . darbepoetin (ARANESP) injection - DIALYSIS  100 mcg Intravenous Q Thu-HD  . feeding supplement  1 Container Oral TID BM  . levothyroxine  200 mcg Oral QAC breakfast  . metoprolol tartrate  12.5 mg Oral BID  . midodrine  10 mg Oral TID WC  . mirtazapine  15 mg Oral Daily  . tacrolimus  2 mg Oral BID  . traZODone  50 mg Oral QHS  . Warfarin - Pharmacist Dosing Inpatient   Does not apply q1800   Continuous Infusions:  PRN Meds: ALPRAZolam, nitroGLYCERIN, ondansetron (ZOFRAN) IV, oxyCODONE   Vital Signs    Vitals:   10/08/16 2300 10/09/16 0300 10/09/16 0510 10/09/16 0749  BP:   113/66 (!) 107/58  Pulse:      Resp: (!) 25 (!) 0 18   Temp:    98.9 F (37.2 C) 98.3 F (36.8 C)  TempSrc:   Oral Oral  SpO2:      Weight:      Height:  5\' 4"  (1.626 m)      Intake/Output Summary (Last 24 hours) at 10/09/16 2505 Last data filed at 10/09/16 0900  Gross per 24 hour  Intake             1320 ml  Output             1000 ml  Net              320 ml   Filed Weights   10/08/16 0757 10/08/16 1400 10/08/16 1813  Weight: 49 kg (108 lb 0.4 oz) 56.2 kg (124 lb) 55.2 kg (121 lb 11.1 oz)    Telemetry    Episode of Vtach yesterday evening, occassional pvcs - Personally Reviewed  ECG    Sinus rhythm, Left axis deviation, anterior infarct likely old, nonspecific ST changes minimally changed from last ECG on 09/29/16 - Personally Reviewed  Physical Exam   GEN: No acute distress.   Neck:JVD 2cm above clavicle Cardiac: RRR, mechanical S2 , no rubs, no murmurs Respiratory: Clear to auscultation bilaterally. GI: Soft, nontender, non-distended  MS: No edema; bilateral bka. Neuro:  Nonfocal  Psych: Normal affect   Labs  Chemistry  Recent Labs Lab 10/07/16 0514 10/08/16 1423  NA 133* 132*  K 4.3 4.2  CL 100* 97*  CO2 22 26  GLUCOSE 86 363*  BUN 12 28*  CREATININE 3.97* 6.31*  CALCIUM 8.2* 8.1*  ALBUMIN  --  1.6*  GFRNONAA 11* 6*  GFRAA 13* 7*  ANIONGAP 11 9     Hematology  Recent Labs Lab 10/07/16 0514 10/08/16 1505  WBC 6.8  --   RBC 3.09*  --   HGB 9.1* 9.0*  HCT 26.9* 26.9*  MCV 87.1  --   MCH 29.4  --   MCHC 33.8  --   RDW 17.8*  --   PLT 152  --     Cardiac Enzymes  Recent Labs Lab 10/07/16 0300 10/07/16 0514  TROPONINI 2.21* 1.95*   No results for input(s): TROPIPOC in the last 168 hours.   BNPNo results for input(s): BNP, PROBNP in the last 168 hours.   DDimer No results for input(s): DDIMER in the last 168 hours.   Radiology    No results found.  Cardiac Studies   TTE 09/30/16 - Normal LV size with EF 30-35%. Wall motion abnormalities   suggestive of multivessel CAD. Moderate  diastolic dysfunction.   normal RV size with mildly decreased systolic function.   Mechanical aortic valve with mild perivalvular regurgitation.   Mild mitral stenosis and mitral regurgitation. .   Patient Profile     61 y.o. female w/ ESRD mwf dialysis, CAD s/p bypass LIMA to LAD, mechanical aortic valve, bilateral BKA, difficulty with access for dialysis, Hep C with liver transplant in 2011, COPD, depression. Presents with intermittent chest pain occurring for the last few weeks with "a bad episode yesterday" causing pt to return.  Was admitted one week ago for NSTEMI and recent syncopal episode.     Assessment & Plan    Unstable angina: Intermittent, longest episode has been one hour at home and pt is out of nitroglycerin.  I believe the pt is having ischemic episodes that are causing her pain but I am not convinced it is due to thrombosis, it is more likely related to the patients existing coronary disease allowing for poor reserve in the setting of hypotensive episodes (pts daughter tells me that the pt has been having very low blood pressures at home sometimes 70/40), and her poor cardiac function.  Troponins still elevated but pt is ESRD and had NSTEMI event only one week ago, with likely ongoing troponin leak.    -With the patients difficult coronary anatomy and existing comorbidities I am unsure that invasive intervention to open up her RPDA would be worth the risk  -Recommend continued medical management and important to refill pts SL nitroglycerin prescription upon discharge. -Short run of Vtach per nurse report about 14 beats -Pt continuing to tolerate metoprolol well, is chest pain free, no presyncope or syncope, pt is stable from a cardiac standpoint and recommend discharge on low dose metoprolol 12.5mg  BID.    Chronic HFrEF: EF 30-35% on last admission 1 wk ago  -Pt doing well on low dose metoprolol, 1L removed during dialysis yesterday afternoon with some improvement in  JVD.  ESRD: MWF dialysis  -hold metoprolol for dialysis -pt tolerated dialysis well yesterday, no low bps or chest pain  Signed, Vickki Muff, MD  10/09/2016, 9:39 AM     Patient seen and examined. Agree with assessment and plan. Tolerated dialysis last night without chest pain or hypotension.  Feels better with  re-institution of metoprolol.  No recurrent chest pain. Did not get metoprolol last night with dialysis and this am has not yet recieved dose. HR now is 113; BP 106/63; will resume metoprolol now at 12.5 mg bid and hold in am on morning dialysis days at home. No JVD; no rales or wheezing, tachycardic at 932 2/6 systolic murmur c/w AVR, no AR; abd nontender, no edema.  Continue medical therapy without plans for intervention.   Troy Sine, MD, [facility] 10/09/2016 10:49 AM

## 2016-10-09 NOTE — Care Management Note (Signed)
Case Management Note  Patient Details  Name: [name] MRN: [id number] Date of Birth: [birth year]  Subjective/Objective:    Pt admitted with NSTEMI                  Action/Plan:   PTA from home with daughter - bil BKA.  Pt is active with [name] - and per [facility] pt has PCP that makes house visits.  Pt recently initiated [facility] services with RN, PT, and OT - agency is aware of admit.  Pt has had multiple admits but not a candidate for Home 1st program due to ongoing outpt HD.  CM will request [facility] resumption orders.  CM will continue to follow for discharge needs   Expected Discharge Date:  10/09/16               Expected Discharge Plan:  [place]  In-House Referral:     Discharge planning Services  CM Consult  Post Acute Care Choice:    Choice offered to:     DME Arranged:    DME Agency:     HH Arranged:    [place] Agency:     Status of Service:  Completed, signed off  If discussed at [employer], dates discussed:    Additional Comments: 10/09/2016 CM received referral for [facility] - pt/family in agreement with agency.  CM contacted agency and provided referral to [facility] - agency to pull required information from Epic and  followup directly with pt/daughter (referral accepted).  Pt to discharge home via private vehicle.  CM left voicemail for [facility] and [facility] to inform of discharge today.  10/07/16 CM contacted by pts daughter - daughter states that the readmits were not preventable and that she feels that she has all the support in the home that she needs - not interested in SNF.  CM will continue to follow for discharge needs Maryclare Labrador, RN 10/09/2016, 2:26 PM

## 2016-10-09 NOTE — Discharge Summary (Signed)
Physician Discharge Summary  GREGORY DOWE XFG:[id number] DOB: [birth year] DOA: 10/06/2016  PCP: Clovia Cuff, MD  Admit date: 10/06/2016 Discharge date: 10/09/2016  Admitted From: Home Disposition: Home  Recommendations for Outpatient Follow-up:  1. Follow up with PCP in 1-2 weeks, Palliative service to follow at home. 2. Please obtain BMP/CBC in one week 3. Started on Ranexa 500 mg daily, Coreg discontinued started on metoprolol 12.5 mg twice a day. 4. Lidoderm 5% patch as needed for pain.  Home Health: NA Equipment/Devices:NA  Discharge Condition: Stable CODE STATUS: DNR Diet recommendation: Diet renal/carb modified with fluid restriction Diet-HS Snack? Nothing; Room service appropriate? Yes; Fluid consistency: Thin Diet - low sodium heart healthy  Brief/Interim Summary: [name] is an 61 y.o. female with a PMH of ESRD on HD MWF, history of hepatitis C/cirrhosis status post liver transplant on chronic immunosuppressants with Prograf, chronic systolic and diastolic CHF, EF 96-78 % and grade 2 diastolic dysfunction, bilateral BKA who was admitted 10/06/16 for evaluation of chest pain. Of note, the patient was recently hospitalized 09/28/16-10/02/16 for treatment of acute metabolic encephalopathy and syncope and was noted to have elevated troponin/NSTEMI during that admission with medical management recommended.  Discharge Diagnoses:  Principal Problem:   NSTEMI (non-ST elevated myocardial infarction) ([place]) Active Problems:   Hypothyroidism   Type 2 diabetes mellitus with chronic kidney disease ([place])   Anemia due to chronic renal failure    End stage renal disease on dialysis (HCC)   CAD (coronary artery disease), native coronary artery   Chronic combined systolic and diastolic congestive heart failure (HCC)   COPD (chronic obstructive pulmonary disease) (HCC)   S/P liver transplant (HCC)   Hyponatremia   NSVT (nonsustained ventricular tachycardia) (HCC)   NSTEMI (non-ST  elevated myocardial infarction) (HCC)/CAD -Patient has known diffuse CAD, presented with chest pain and troponin elevated at 2.21. -Cardiology consulted, patient receiving anticoagulation with Coumadin so heparin was not started. -LDL is 119, report Troponin elevation consistent with non-STEMI.  -No cardiac catheterization was done, recent cardiac catheterization on 08/12/2016 showed diffuse multivessel disease. -Coreg switched to metoprolol, continue aspirin and nitroglycerin as needed. -Positive resident team consulted, recommended Ranexa 500 mg once a day because of ESRD. -Also recommended Dilaudid 2-4 mg orally for the pain (not prescribed as patient already on oxycodone) and lidocaine patch.    Hypothyroidism -Recent TSH is elevated at 16.599, patient is on 200 g of levothyroxine, continued. -Check TSH in 3-4 weeks.    Type 2 diabetes mellitus with chronic kidney disease ([place]) Appears to be diet controlled.    Anemia due to chronic renal failure  Hemoglobin stable at 9.1 mg/dL.    End stage renal disease on dialysis (HCC)/metabolic bone disease/secondary hyperparathyroidism HD per nephrology. Continue calcitriol and PhosLo.    Chronic combined systolic and diastolic congestive heart failure (HCC) EF 30-35 percent/grade 2 diastolic dysfunction by echo done 09/30/16. Volume control through dialysis.    COPD (chronic obstructive pulmonary disease) (HCC)    S/P liver transplant (HCC) Continue Prograf.    Status post aortic valve replacement with mechanical valve Anticoagulated with Coumadin. INR 2.31.    Hyponatremia Mild. Monitor.  Goals of care -Patient seen by palliative medicine team because of the chronic medical conditions. -Palliative service to follow as outpatient, she is DNR/DNI. -Mrs. [name] understand she has serious medical condition with diffuse CAD and ESRD and she is high risk for sudden cardiac death. -To follow-up with palliative care and PCP as  outpatient if they think she  needs adjustment for pain medication regimen.   Discharge Instructions  Discharge Instructions    Diet - low sodium heart healthy    Complete by:  As directed    Increase activity slowly    Complete by:  As directed      Allergies as of 10/09/2016      Reactions   Acetaminophen Other (See Comments)   Liver transplant recipient    Morphine And Related Itching   Codeine Itching   Mirtazapine Other (See Comments)   hallucination      Medication List    STOP taking these medications   carvedilol 3.125 MG tablet Commonly known as:  COREG     TAKE these medications   ALPRAZolam 0.5 MG tablet Commonly known as:  XANAX Take 0.5 mg by mouth 3 (three) times daily as needed for anxiety.   aspirin EC 81 MG tablet Take 81 mg by mouth daily at 12 noon.   calcitRIOL 0.5 MCG capsule Commonly known as:  ROCALTROL Take 1 capsule (0.5 mcg total) by mouth every Monday, Wednesday, and Friday with hemodialysis.   calcium acetate 667 MG capsule Commonly known as:  PHOSLO Take 1 capsule (667 mg total) by mouth 3 (three) times daily with meals.   calcium carbonate 500 MG chewable tablet Commonly known as:  TUMS - dosed in mg elemental calcium Chew 1 tablet by mouth daily.   feeding supplement (PRO-STAT SUGAR FREE 64) Liqd Take 30 mLs by mouth 2 (two) times daily.   feeding supplement Liqd Take 1 Container by mouth 3 (three) times daily between meals.   gabapentin 300 MG capsule Commonly known as:  NEURONTIN Take 300 mg by mouth at bedtime.   levothyroxine 200 MCG tablet Commonly known as:  SYNTHROID, LEVOTHROID Take 1 tablet (200 mcg total) by mouth daily before breakfast.   lidocaine 5 % Commonly known as:  LIDODERM Place 1 patch onto the skin daily. Remove & Discard patch within 12 hours or as directed by MD   methocarbamol 500 MG tablet Commonly known as:  ROBAXIN Take 0.5 tablets (250 mg total) by mouth every 6 (six) hours as needed for  muscle spasms.   metoCLOPramide 5 MG tablet Commonly known as:  REGLAN Take 1 tablet (5 mg total) by mouth 2 (two) times daily before a meal. Decrease to once a day before breakfast after a couple of weeks. What changed:  when to take this  reasons to take this  additional instructions   metoprolol tartrate 25 MG tablet Commonly known as:  LOPRESSOR Take 0.5 tablets (12.5 mg total) by mouth 2 (two) times daily.   midodrine 10 MG tablet Commonly known as:  PROAMATINE Take 1 tablet (10 mg total) by mouth 3 (three) times daily with meals.   mirtazapine 15 MG tablet Commonly known as:  REMERON Take 15 mg by mouth at bedtime.   multivitamin Tabs tablet Take 1 tablet by mouth at bedtime.   nitroGLYCERIN 0.4 MG SL tablet Commonly known as:  NITROSTAT Place 1 tablet (0.4 mg total) under the tongue every 5 (five) minutes as needed for chest pain.   ondansetron 8 MG disintegrating tablet Commonly known as:  ZOFRAN ODT 8mg  ODT q4 hours prn nausea What changed:  how much to take  how to take this  when to take this  reasons to take this  additional instructions   oxyCODONE 5 MG immediate release tablet Commonly known as:  Oxy IR/ROXICODONE Take 1 tablet (5 mg total) by mouth  every 4 (four) hours as needed for severe pain.   promethazine 12.5 MG tablet Commonly known as:  PHENERGAN Take 12.5 mg by mouth every 6 (six) hours as needed for nausea or vomiting.   ranolazine 500 MG 12 hr tablet Commonly known as:  RANEXA Take 1 tablet (500 mg total) by mouth daily.   tacrolimus 1 MG capsule Commonly known as:  PROGRAF Take 2 mg by mouth 2 (two) times daily.   traMADol 50 MG tablet Commonly known as:  ULTRAM Take 50 mg by mouth 3 (three) times daily as needed for moderate pain.   traZODone 50 MG tablet Commonly known as:  DESYREL Take 50 mg by mouth at bedtime.   warfarin 2 MG tablet Commonly known as:  COUMADIN Take 1-2 tablets by mouth daily as directed by  coumadin clinic What changed:  how much to take  how to take this  when to take this  additional instructions            Discharge Care Instructions        Start     Ordered   10/09/16 0000  Increase activity slowly     10/09/16 1301   10/09/16 0000  Diet - low sodium heart healthy     10/09/16 1301   10/09/16 0000  ranolazine (RANEXA) 500 MG 12 hr tablet  Daily     10/09/16 1301   10/09/16 0000  metoprolol tartrate (LOPRESSOR) 25 MG tablet  2 times daily     10/09/16 1301   10/09/16 0000  lidocaine (LIDODERM) 5 %  Every 24 hours     10/09/16 1301      Allergies  Allergen Reactions  . Acetaminophen Other (See Comments)    Liver transplant recipient   . Morphine And Related Itching  . Codeine Itching  . Mirtazapine Other (See Comments)    hallucination    Consultations:  Treatment Team:   Lbcardiology, [place], MD  Estanislado Emms, MD  Palliative medicine   Procedures (Echo, Carotid, EGD, Colonoscopy, ERCP)   Radiological studies:  No results found.  Subjective:  Discharge Exam: Vitals:   10/09/16 0300 10/09/16 0510 10/09/16 0749 10/09/16 1206  BP:  113/66 (!) 107/58 106/62  Pulse:      Resp: (!) 0 18  (!) 21  Temp:  98.9 F (37.2 C) 98.3 F (36.8 C) 97.9 F (36.6 C)  TempSrc:  Oral Oral Oral  SpO2:      Weight:      Height: 5\' 4"  (1.626 m)      General: Pt is alert, awake, not in acute distress Cardiovascular: RRR, S1/S2 +, no rubs, no gallops Respiratory: CTA bilaterally, no wheezing, no rhonchi Abdominal: Soft, NT, ND, bowel sounds + Extremities: no edema, no cyanosis   The results of significant diagnostics from this hospitalization (including imaging, microbiology, ancillary and laboratory) are listed below for reference.    Microbiology: Recent Results (from the past 240 hour(s))  MRSA PCR Screening     Status: None   Collection Time: 10/07/16  4:15 AM  Result Value Ref Range Status   MRSA by PCR NEGATIVE NEGATIVE  Final    Comment:        The GeneXpert MRSA Assay (FDA approved for NASAL specimens only), is one component of a comprehensive MRSA colonization surveillance program. It is not intended to diagnose MRSA infection nor to guide or monitor treatment for MRSA infections.      Labs: BNP (last 3 results)  Recent Labs  08/09/16 1930 08/27/16 0738 09/29/16 0510  BNP >4,500.0* >4,500.0* 9,509.3*   Basic Metabolic Panel:  Recent Labs Lab 10/07/16 0514 10/08/16 1423 10/09/16 0505  NA 133* 132*  --   K 4.3 4.2  --   CL 100* 97*  --   CO2 22 26  --   GLUCOSE 86 363*  --   BUN 12 28*  --   CREATININE 3.97* 6.31*  --   CALCIUM 8.2* 8.1*  --   MG  --   --  2.1  PHOS  --  7.6*  --    Liver Function Tests:  Recent Labs Lab 10/08/16 1423  ALBUMIN 1.6*   No results for input(s): LIPASE, AMYLASE in the last 168 hours. No results for input(s): AMMONIA in the last 168 hours. CBC:  Recent Labs Lab 10/07/16 0514 10/08/16 1505  WBC 6.8  --   HGB 9.1* 9.0*  HCT 26.9* 26.9*  MCV 87.1  --   PLT 152  --    Cardiac Enzymes:  Recent Labs Lab 10/07/16 0300 10/07/16 0514  TROPONINI 2.21* 1.95*   BNP: Invalid input(s): POCBNP CBG: No results for input(s): GLUCAP in the last 168 hours. D-Dimer No results for input(s): DDIMER in the last 72 hours. Hgb A1c No results for input(s): HGBA1C in the last 72 hours. Lipid Profile  Recent Labs  10/07/16 0300  CHOL 169  HDL 33*  LDLCALC 119*  TRIG 86  CHOLHDL 5.1   Thyroid function studies No results for input(s): TSH, T4TOTAL, T3FREE, THYROIDAB in the last 72 hours.  Invalid input(s): FREET3 Anemia work up No results for input(s): VITAMINB12, FOLATE, FERRITIN, TIBC, IRON, RETICCTPCT in the last 72 hours. Urinalysis    Component Value Date/Time   COLORURINE YELLOW 11/02/2012 0428   APPEARANCEUR CLOUDY (A) 11/02/2012 0428   LABSPEC 1.011 11/02/2012 0428   PHURINE 8.0 11/02/2012 0428   GLUCOSEU 250 (A) 11/02/2012  0428   HGBUR LARGE (A) 11/02/2012 0428   BILIRUBINUR NEGATIVE 11/02/2012 0428   KETONESUR NEGATIVE 11/02/2012 0428   PROTEINUR >300 (A) 11/02/2012 0428   UROBILINOGEN 0.2 11/02/2012 0428   NITRITE NEGATIVE 11/02/2012 0428   LEUKOCYTESUR NEGATIVE 11/02/2012 0428   Sepsis Labs Invalid input(s): PROCALCITONIN,  WBC,  LACTICIDVEN Microbiology Recent Results (from the past 240 hour(s))  MRSA PCR Screening     Status: None   Collection Time: 10/07/16  4:15 AM  Result Value Ref Range Status   MRSA by PCR NEGATIVE NEGATIVE Final    Comment:        The GeneXpert MRSA Assay (FDA approved for NASAL specimens only), is one component of a comprehensive MRSA colonization surveillance program. It is not intended to diagnose MRSA infection nor to guide or monitor treatment for MRSA infections.      Time coordinating discharge: Over 30 minutes  SIGNED:   Birdie Hopes, MD  [facility] 10/09/2016, 1:02 PM Pager   If 7PM-7AM, please contact night-coverage [email or web address] Password [email or web address]

## 2016-10-09 NOTE — Consult Note (Signed)
   [facility] CM Inpatient Consult   10/09/2016  [name] 06/23/1955 [id number]    Received called from inpatient RNCM stating she made referral to [facility] outpatient palliative program that is administered by [place].  Will make [facility] RNCM aware of above.   [name], MSN-Ed, RN,BSN [facility] Liaison [phone]

## 2016-10-09 NOTE — Progress Notes (Signed)
[facility] Progress Note  Subjective: thinks she may be going home.  Tired of coming to hospital so frequently.  We discussed reasons for this , tendency of heart disease to get worse not better, and discussed her meeting with pall care.  She and her daughter would like to meet with pall care.    Vitals:   10/08/16 2300 10/09/16 0300 10/09/16 0510 10/09/16 0749  BP:   113/66 (!) 107/58  Pulse:      Resp: (!) 25 (!) 0 18   Temp:   98.9 F (37.2 C) 98.3 F (36.8 C)  TempSrc:   Oral Oral  SpO2:      Weight:      Height:  5\' 4"  (1.626 m)      Inpatient medications: . aspirin EC  81 mg Oral Q1200  . calcitRIOL  0.5 mcg Oral Q M,W,F-HD  . calcium acetate  667 mg Oral TID WC  . darbepoetin (ARANESP) injection - DIALYSIS  100 mcg Intravenous Q Thu-HD  . feeding supplement  1 Container Oral TID BM  . levothyroxine  200 mcg Oral QAC breakfast  . metoprolol tartrate  12.5 mg Oral BID  . midodrine  10 mg Oral TID WC  . mirtazapine  15 mg Oral Daily  . tacrolimus  2 mg Oral BID  . traZODone  50 mg Oral QHS  . Warfarin - Pharmacist Dosing Inpatient   Does not apply q1800    ALPRAZolam, nitroGLYCERIN, ondansetron (ZOFRAN) IV, oxyCODONE  Exam: Alert, no distress +JVD Chest clear bialt Cor irreg irreg no mrg Abd soft ntnd no ascites Ext bilat bka R IJ cath  Dialysis: MWF [name] 3h 32min  44.5kg  Hep none  R IJ TDC -no ESA/ Fe -Calcitriol 0.5 ug tiw (last pth 342)      Impression: 1  ESRD HD MWF 2  Unstable angina - on low dose MTP per cards, severe CAD on medical Rx only 3  CM EF 30% 4  Anemia started esa here 5  DM2 6  Liver Tx 7  Hx AVR on coumadin 8  FTT in adult 9  DNR  Plan - have consulted palliative care in case they can see today   [name] MD [facility] pager [phone]   10/09/2016, 11:35 AM    Recent Labs Lab 10/07/16 0514 10/08/16 1423  NA 133* 132*  K 4.3 4.2  CL 100* 97*  CO2 22 26  GLUCOSE 86 363*  BUN 12  28*  CREATININE 3.97* 6.31*  CALCIUM 8.2* 8.1*  PHOS  --  7.6*    Recent Labs Lab 10/08/16 1423  ALBUMIN 1.6*    Recent Labs Lab 10/07/16 0514 10/08/16 1505  WBC 6.8  --   HGB 9.1* 9.0*  HCT 26.9* 26.9*  MCV 87.1  --   PLT 152  --    Iron/TIBC/Ferritin/ %Sat    Component Value Date/Time   IRON 22 (L) 03/12/2016 1308   TIBC 181 (L) 03/12/2016 1308   FERRITIN 1,095 (H) 08/30/2012 1449   IRONPCTSAT 12 03/12/2016 1308

## 2016-10-09 NOTE — Consult Note (Addendum)
Palliative Care Consult Reason: Chronic Angina Chest Pain, Symptom Management in context of goals of care Requested by: Dr. Jonnie Finner   This patient is a 61 year old woman who has end-stage renal disease on hemodialysis as well as multivessel coronary artery disease with chronic anginal pain that has led to multiple hospital admissions-additionally she has severe peripheral vascular disease which is also led to bilateral lower extremity amputations.   I met with Mrs. Benegas and her daughter to discuss her goals of care and potential palliative pain control options I've discussed her care with cardiology and also with nephrology the consensus is that this pain is ischemic pain associated with hypotension induced by hemodialysis. Severe chest pain is the reason that she presents to the emergency department. I have also spoken with her primary care physician who is a doctor that makes home visits he has in the recent past not agreed to prescribe opiates unless she goes to a pain clinic. I have discussed this with him and stated that she is a palliative care patient with heavy chronic disease burden and that a pain clinic management of opioids is probably not going to be effective for her. I requested that she receive an outpatient palliative care consultation.  My recommendations for her pain management regimen include:  #1 she needs opioid pain control for chronic angina with goals being palliative- that she is a hemodialysis patient I recommend using oral Dilantin 2-4 mg by mouth every 6 hours when necessary for pain. She may also benefit from a prescription strength Lidoderm patch apply to her anterior chest wall-she has had trouble getting these authorized by her insurance company in the past.  #2 single daily dose of Ranexa is a consideration in hemodialysis patients with chronic anginal pain this should not reduce her blood pressure or her heart rate. Will do a trial of this and ask her PCP to follow  up.  #Outpatient palliative care referral  Goals of Care: She understands her condition is serious and life threatening and that she has a high risk of sudden death. She has a DNR. She wants QOL for the time she has left. She wants to avoid the hospital.  Planned home discharge today.  Lane Hacker, DO Palliative Medicine [phone]  50 minutes. Greater than 50%  of this time was spent counseling and coordinating care related to the above assessment and plan.

## 2016-10-09 NOTE — Progress Notes (Signed)
Inpatient Diabetes Program Recommendations  AACE/ADA: New Consensus Statement on Inpatient Glycemic Control (2015)  Target Ranges:  Prepandial:   less than 140 mg/dL      Peak postprandial:   less than 180 mg/dL (1-2 hours)      Critically ill patients:  140 - 180 mg/dL   Lab Results  Component Value Date   GLUCAP 221 (H) 10/02/2016   HGBA1C 5.9 (H) 08/28/2016    Review of Glycemic Control Results for [name], [name] (MRN [id number]) as of 10/09/2016 10:53  Ref. Range 10/07/2016 05:14 10/08/2016 14:23  Glucose Latest Ref Range: 65 - 99 mg/dL 86 363 (H)   Inpatient Diabetes Program Recommendations:    -Please consider Glycemic control order set.  Thank you, Nani Gasser. Nakhi Choi, RN, MSN, CDE  Diabetes Coordinator Inpatient Glycemic Control Team Team Pager [phone] (8am-5pm) 10/09/2016 10:55 AM

## 2016-10-09 NOTE — Progress Notes (Signed)
ANTICOAGULATION CONSULT NOTE - Follow Up Consult  Pharmacy Consult for coumadin  Indication: AVR  Allergies  Allergen Reactions  . Acetaminophen Other (See Comments)    Liver transplant recipient   . Morphine And Related Itching  . Codeine Itching  . Mirtazapine Other (See Comments)    hallucination   Patient Measurements: Height: 5\' 4"  (162.6 cm) (Before bilateral BKA) Weight: 121 lb 11.1 oz (55.2 kg) IBW/kg (Calculated) : 54.7  Vital Signs: Temp: 98.3 F (36.8 C) (08/30 0749) Temp Source: Oral (08/30 0749) BP: 107/58 (08/30 0749)  Labs:  Recent Labs  10/07/16 0300 10/07/16 0514 10/08/16 0246 10/08/16 1423 10/08/16 1505 10/09/16 0505  HGB  --  9.1*  --   --  9.0*  --   HCT  --  26.9*  --   --  26.9*  --   PLT  --  152  --   --   --   --   LABPROT  --  25.8* 26.3*  --   --  25.7*  INR  --  2.31 2.44  --   --  2.37  CREATININE  --  3.97*  --  6.31*  --   --   TROPONINI 2.21* 1.95*  --   --   --   --    Estimated Creatinine Clearance: 8 mL/min (A) (by C-G formula based on SCr of 6.31 mg/dL (H)).  Assessment: 61yo female admitted for further w/u of CP, to continue Coumadin for AVR.  INR subtherapeutic: 2.37 - HgB 9.0 stable; no bleeding reported overnight. Patients INR fluctuates greatly with small dose changes. Patient has received two boosted doses, will give PTA dose this evening and evaluate INR tomorrow.     PTA warfarin dose: warfarin 2mg  PO daily  Goal of Therapy:  INR 2.5-3.5 Monitor platelets by anticoagulation protocol: Yes   Plan:  Warfarin  2mg  PO x1 tonight Daily INR/CBC Monitor for s/sx of bleeding  Georga Bora, PharmD Clinical Pharmacist 10/09/2016 11:20 AM

## 2016-10-10 ENCOUNTER — Other Ambulatory Visit: Payer: Self-pay | Admitting: *Deleted

## 2016-10-10 DIAGNOSIS — E118 Type 2 diabetes mellitus with unspecified complications: Secondary | ICD-10-CM | POA: Diagnosis not present

## 2016-10-10 DIAGNOSIS — E876 Hypokalemia: Secondary | ICD-10-CM | POA: Diagnosis not present

## 2016-10-10 DIAGNOSIS — I214 Non-ST elevation (NSTEMI) myocardial infarction: Secondary | ICD-10-CM | POA: Diagnosis not present

## 2016-10-10 DIAGNOSIS — N2581 Secondary hyperparathyroidism of renal origin: Secondary | ICD-10-CM | POA: Diagnosis not present

## 2016-10-10 DIAGNOSIS — D631 Anemia in chronic kidney disease: Secondary | ICD-10-CM | POA: Diagnosis not present

## 2016-10-10 DIAGNOSIS — Z992 Dependence on renal dialysis: Secondary | ICD-10-CM | POA: Diagnosis not present

## 2016-10-10 DIAGNOSIS — I2510 Atherosclerotic heart disease of native coronary artery without angina pectoris: Secondary | ICD-10-CM | POA: Diagnosis not present

## 2016-10-10 DIAGNOSIS — N186 End stage renal disease: Secondary | ICD-10-CM | POA: Diagnosis not present

## 2016-10-10 DIAGNOSIS — T8640 Unspecified complication of liver transplant: Secondary | ICD-10-CM | POA: Diagnosis not present

## 2016-10-10 NOTE — Patient Outreach (Signed)
[facility]) Care Management  10/10/2016  [name] 01-Feb-1956 [id number]  Placed call to patient contact number, spoke with daughter [name] listed on consent  that answered call and verified HIPAA information .Explained reason for call and that I am covering for assigned care manager [name].  Daughter reports patient recently home from Dialysis is tired and  resting in bed. She discussed patient had low blood pressure episode at treatment today.  Daughter discussed she manages patient medications she was able to review medications. Daughter discussed [name] home health has visited on today. Daughter reports she has contacted PCP , [name] and home visit is planned for 9/1.  She discussed patient status is no Code Blue and she states she asked the PCP to bring yellow DNR from to visit on tomorrow, she plans to discuss nitroglycerin with MD at visit, patient does not currently have she states prescription out of date need refill order also  need prior approval on Lidoderm patch that she will also discuss at visit. . Daughter discussed inpatient Palliative care consult and plans for palliative follow as outpatient .     Plan Placed call to Care connections spoke with [name] that verifies they have received referral and program director is reviewing case now, she will follow up with [name] in next week. Patient will be followed with weekly outreaches as part of the transition of care program, will update [name] [facility] that  will follow up in next week.   [name] Draft, RN, [place] Management Coordinator  [phone]- Mobile [phone]- Toll Free Main Office

## 2016-10-11 DIAGNOSIS — J449 Chronic obstructive pulmonary disease, unspecified: Secondary | ICD-10-CM | POA: Diagnosis not present

## 2016-10-11 DIAGNOSIS — I2510 Atherosclerotic heart disease of native coronary artery without angina pectoris: Secondary | ICD-10-CM | POA: Diagnosis not present

## 2016-10-11 DIAGNOSIS — I959 Hypotension, unspecified: Secondary | ICD-10-CM | POA: Diagnosis not present

## 2016-10-11 DIAGNOSIS — M6281 Muscle weakness (generalized): Secondary | ICD-10-CM | POA: Diagnosis not present

## 2016-10-11 DIAGNOSIS — I509 Heart failure, unspecified: Secondary | ICD-10-CM | POA: Diagnosis not present

## 2016-10-11 DIAGNOSIS — Z7901 Long term (current) use of anticoagulants: Secondary | ICD-10-CM | POA: Diagnosis not present

## 2016-10-11 DIAGNOSIS — Z0000 Encounter for general adult medical examination without abnormal findings: Secondary | ICD-10-CM | POA: Diagnosis not present

## 2016-10-13 DIAGNOSIS — I214 Non-ST elevation (NSTEMI) myocardial infarction: Secondary | ICD-10-CM | POA: Diagnosis not present

## 2016-10-13 DIAGNOSIS — D631 Anemia in chronic kidney disease: Secondary | ICD-10-CM | POA: Diagnosis not present

## 2016-10-13 DIAGNOSIS — I2510 Atherosclerotic heart disease of native coronary artery without angina pectoris: Secondary | ICD-10-CM | POA: Diagnosis not present

## 2016-10-13 DIAGNOSIS — E876 Hypokalemia: Secondary | ICD-10-CM | POA: Diagnosis not present

## 2016-10-13 DIAGNOSIS — I359 Nonrheumatic aortic valve disorder, unspecified: Secondary | ICD-10-CM | POA: Diagnosis not present

## 2016-10-13 DIAGNOSIS — Z7901 Long term (current) use of anticoagulants: Secondary | ICD-10-CM | POA: Diagnosis not present

## 2016-10-13 DIAGNOSIS — N186 End stage renal disease: Secondary | ICD-10-CM | POA: Diagnosis not present

## 2016-10-13 DIAGNOSIS — N2581 Secondary hyperparathyroidism of renal origin: Secondary | ICD-10-CM | POA: Diagnosis not present

## 2016-10-13 DIAGNOSIS — E118 Type 2 diabetes mellitus with unspecified complications: Secondary | ICD-10-CM | POA: Diagnosis not present

## 2016-10-13 LAB — PROTIME-INR: INR: 5.6 — AB (ref 0.9–1.1)

## 2016-10-14 ENCOUNTER — Ambulatory Visit: Payer: Medicare Other | Admitting: Cardiovascular Disease

## 2016-10-14 ENCOUNTER — Ambulatory Visit (INDEPENDENT_AMBULATORY_CARE_PROVIDER_SITE_OTHER): Payer: Medicare Other | Admitting: Pharmacist Clinician (PhC)/ Clinical Pharmacy Specialist

## 2016-10-14 DIAGNOSIS — E118 Type 2 diabetes mellitus with unspecified complications: Secondary | ICD-10-CM | POA: Diagnosis not present

## 2016-10-14 DIAGNOSIS — Z952 Presence of prosthetic heart valve: Secondary | ICD-10-CM

## 2016-10-14 DIAGNOSIS — N186 End stage renal disease: Secondary | ICD-10-CM | POA: Diagnosis not present

## 2016-10-14 DIAGNOSIS — D631 Anemia in chronic kidney disease: Secondary | ICD-10-CM | POA: Diagnosis not present

## 2016-10-14 DIAGNOSIS — N2581 Secondary hyperparathyroidism of renal origin: Secondary | ICD-10-CM | POA: Diagnosis not present

## 2016-10-14 DIAGNOSIS — Z7901 Long term (current) use of anticoagulants: Secondary | ICD-10-CM

## 2016-10-14 DIAGNOSIS — E876 Hypokalemia: Secondary | ICD-10-CM | POA: Diagnosis not present

## 2016-10-15 ENCOUNTER — Other Ambulatory Visit: Payer: Self-pay

## 2016-10-15 DIAGNOSIS — I214 Non-ST elevation (NSTEMI) myocardial infarction: Secondary | ICD-10-CM | POA: Diagnosis not present

## 2016-10-15 DIAGNOSIS — I2510 Atherosclerotic heart disease of native coronary artery without angina pectoris: Secondary | ICD-10-CM | POA: Diagnosis not present

## 2016-10-15 NOTE — Patient Outreach (Signed)
Transition of care: Placed call to patient and spoke with Jayme Cloud ( daughter). Reviewed reason for call.  Daughter reports that patient continues to be weak. Reports dialysis center would not do dialysis one day this week because of elevated heart rate.  Daughter reports that patient will go back to dialysis on Friday and it is unknown if she will get a treatment.  Daughter reports that patient wanted palliative care but does not want to stop dialysis.    Message received from [place] that stated patients case was reviewed and declined due to continued dialysis.   Offered home visit for tomorrow and daughter agreed.  Will update care plan after full assessment tomorrow.  [name], RN, BSN, CEN [facility] [employer] [phone]

## 2016-10-16 ENCOUNTER — Other Ambulatory Visit: Payer: Self-pay

## 2016-10-16 DIAGNOSIS — I214 Non-ST elevation (NSTEMI) myocardial infarction: Secondary | ICD-10-CM | POA: Diagnosis not present

## 2016-10-16 DIAGNOSIS — I2510 Atherosclerotic heart disease of native coronary artery without angina pectoris: Secondary | ICD-10-CM | POA: Diagnosis not present

## 2016-10-16 NOTE — Patient Outreach (Signed)
[name]) Care Management   10/16/2016  [name] 1956-01-23 [id number]  [name] is an 61 y.o. female Arrived for home visit. Patient sitting in wheelchair.  Daughter [name] Subjective: Patient reports that she has a poor quality of life. Patient reports that she does not like depending on others to help her.  Patient reports that she misses being able to walk.  Patient reports that she has no appetite and is only eating 1 meal per day. She reports that she is drinking clear ensure or boost.  Patient reports that her daughter takes her to dialysis 3 times per week.  States that her daughter is starting a new job next week and she be home alone more.  Reports that she is able to fix herself something to eat.  Patient request assistance with transportation, food and medicaid.  Patient reports that she continues to have problems with low blood pressure and or a high heart rate.  Reports that she did not get a dialysis treatment this week because her heart rate was too high.  Patient is currently managed by [facility] and a home visit MD.   Objective:  Noted drainage from right stump from underneath scab.  Awake and alert.  Vitals:   10/16/16 1501  BP: 110/70  Pulse: (!) 104  Resp: 16  Weight: 102 lb (46.3 kg)   Review of Systems  HENT: Negative.   Eyes: Positive for blurred vision.       Wears glasses  Respiratory: Negative.   Cardiovascular: Negative.   Gastrointestinal: Positive for constipation.  Genitourinary:       On dialysis 3 times per week.   Musculoskeletal: Positive for joint pain (legs).  Skin:       Right stump with scabbed area to skin flap  Neurological: Positive for weakness.       Denies  Endo/Heme/Allergies: Bruises/bleeds easily.  Psychiatric/Behavioral: Positive for depression. The patient is nervous/anxious.     Physical Exam  Constitutional: She is oriented to person, place, and time. She appears well-developed.   Thin appearing  Cardiovascular: Normal rate and normal heart sounds.   Respiratory: Effort normal and breath sounds normal.  GI: Soft. Bowel sounds are normal.  Musculoskeletal: Normal range of motion. She exhibits no edema.  Bilateral amputation  Neurological: She is alert and oriented to person, place, and time.  Skin: Skin is warm and dry.  Right stump with scabbed area about 2.5 X 1 inch. With bloody yellow tinged drainage.   Psychiatric: She has a normal mood and affect. Her behavior is normal. Judgment and thought content normal.    Encounter Medications:   Outpatient Encounter Prescriptions as of 10/16/2016  Medication Sig Note  . ALPRAZolam (XANAX) 0.5 MG tablet Take 0.5 mg by mouth 3 (three) times daily as needed for anxiety.    . Amino Acids-Protein Hydrolys (FEEDING SUPPLEMENT, PRO-STAT SUGAR FREE 64,) LIQD Take 30 mLs by mouth 2 (two) times daily. 10/16/2016: During dialysis  . aspirin EC 81 MG tablet Take 81 mg by mouth daily at 12 noon.    . calcitRIOL (ROCALTROL) 0.5 MCG capsule Take 1 capsule (0.5 mcg total) by mouth every Monday, Wednesday, and Friday with hemodialysis.   [name] calcium carbonate (TUMS - DOSED IN MG ELEMENTAL CALCIUM) 500 MG chewable tablet Chew 1 tablet by mouth daily.   . feeding supplement (BOOST / RESOURCE BREEZE) LIQD Take 1 Container by mouth 3 (three) times daily between meals. 08/27/2016: NO  MILKY PRODUCTS ONLY THE JUICE PRODUCTS  . levothyroxine (SYNTHROID, LEVOTHROID) 200 MCG tablet Take 1 tablet (200 mcg total) by mouth daily before breakfast.   . methocarbamol (ROBAXIN) 500 MG tablet Take 0.5 tablets (250 mg total) by mouth every 6 (six) hours as needed for muscle spasms.   . metoCLOPramide (REGLAN) 5 MG tablet Take 1 tablet (5 mg total) by mouth 2 (two) times daily before a meal. Decrease to once a day before breakfast after a couple of weeks. (Patient taking differently: Take 5 mg by mouth daily as needed for nausea or vomiting. ) 10/10/2016: Taking once  daily  . metoprolol tartrate (LOPRESSOR) 25 MG tablet Take 0.5 tablets (12.5 mg total) by mouth 2 (two) times daily.   . midodrine (PROAMATINE) 10 MG tablet Take 1 tablet (10 mg total) by mouth 3 (three) times daily with meals.   . mirtazapine (REMERON) 15 MG tablet Take 15 mg by mouth at bedtime.    . ondansetron (ZOFRAN ODT) 8 MG disintegrating tablet 28m ODT q4 hours prn nausea (Patient taking differently: Take 8 mg by mouth every 4 (four) hours as needed for nausea or vomiting. )   . oxyCODONE (OXY IR/ROXICODONE) 5 MG immediate release tablet Take 1 tablet (5 mg total) by mouth every 4 (four) hours as needed for severe pain.   . ranolazine (RANEXA) 500 MG 12 hr tablet Take 1 tablet (500 mg total) by mouth daily.   . tacrolimus (PROGRAF) 1 MG capsule Take 2 mg by mouth 2 (two) times daily.    . traMADol (ULTRAM) 50 MG tablet Take 50 mg by mouth 3 (three) times daily as needed for moderate pain.   . traZODone (DESYREL) 50 MG tablet Take 50 mg by mouth at bedtime.   . calcium acetate (PHOSLO) 667 MG capsule Take 1 capsule (667 mg total) by mouth 3 (three) times daily with meals. (Patient not taking: Reported on 09/29/2016)   . gabapentin (NEURONTIN) 300 MG capsule Take 300 mg by mouth at bedtime. Daughter reports patient taking 100 mg on hemodialysis days   . lidocaine (LIDODERM) 5 % Place 1 patch onto the skin daily. Remove & Discard patch within 12 hours or as directed by MD (Patient not taking: Reported on 10/10/2016)   . multivitamin (RENA-VIT) TABS tablet Take 1 tablet by mouth at bedtime. (Patient not taking: Reported on 10/07/2016)   . nitroGLYCERIN (NITROSTAT) 0.4 MG SL tablet Place 1 tablet (0.4 mg total) under the tongue every 5 (five) minutes as needed for chest pain. (Patient not taking: Reported on 10/16/2016)   . promethazine (PHENERGAN) 12.5 MG tablet Take 12.5 mg by mouth every 6 (six) hours as needed for nausea or vomiting.   . warfarin (COUMADIN) 2 MG tablet Take 1-2 tablets by mouth  daily as directed by coumadin clinic (Patient not taking: Reported on 10/16/2016)    No facility-administered encounter medications on file as of 10/16/2016.     Functional Status:   In your present state of health, do you have any difficulty performing the following activities: 10/16/2016 10/07/2016  Hearing? N N  Vision? YTempie Donning Comment wears glasses -  Difficulty concentrating or making decisions? Y N  Walking or climbing stairs? Y Y  Dressing or bathing? Y N  Doing errands, shopping? YTempie Donning Preparing Food and eating ? Y -  Using the Toilet? Y -  In the past six months, have you accidently leaked urine? N -  Do you have problems with loss of  bowel control? N -  Managing your Medications? Y -  Comment daughter pays for medications -  Managing your Finances? Y -  Housekeeping or managing your Housekeeping? N -  Some recent data might be hidden    Fall/Depression Screening:    Fall Risk  10/16/2016 08/22/2016 11/12/2015  Falls in the past year? Yes No No  Number falls in past yr: 1 - -  Injury with Fall? No - -  Comment - - -  Risk Factor Category  - - -  Risk for fall due to : Impaired mobility - -  Follow up - - -   PHQ 2/9 Scores 10/16/2016 09/18/2014  PHQ - 2 Score 2 3  PHQ- 9 Score 17 10    Assessment:  (1) reviewed Transition of care program.  Provided [facility] calendar. Provided my card and reviewed the 24 hour nurse advice line. (2) recent admission for MI's X2 in the last month. (3) dialysis MWF (4) scabbed area to the right stump with drainage. (5) request assistance with medicaid application, food resources and transportation.  (6) reports poor quality of life. (7) poor nutririon (8) interested in palliative care.   Plan:  (1) reviewed consent on file. Patient declines changes. (2) reviewed signs and symptoms of a heart attack and a stroke and encouraged patient to call 911 for these symptoms. (3) encouraged patient to discuss concerns with [facility] doctors about her  future wishes.  (4) daughter called Dr. Daphene Jaeger about stump drainage. MD requested to speak to me. I described my findings and he states he will call in an antibiotic and send out his nurse practioner for evalulation. Daughter and patient informed. (5) referral placed for [facility] social worker.  Provided contact information for [place] social worker.  (6) reviewed activities with patient to help her feel more independent.  (7) patient has agreed to increase her food consumption.  (8)  Care connections refused case due to dialysis.  Will discuss other options with Education officer, museum.   Goal setting and care planning during home visit and primary goal is to avoid a readmission.   This note sent to primary MD and [facility]. Barrier letter sent.  Next outreach via phone in 1 week. Patient request opposite of dialysis days.  [facility] CM Care Plan Problem One     Most Recent Value  Care Plan Problem One  Recent [name] admisson for cardiac event.   Role Documenting the Problem One  Care Management [place] for Problem One  Active  [facility] Long Term Goal   Patient will report no readmissions in the next 60 days  [facility] Long Term Goal Start Date  10/10/16  Interventions for Problem One Long Term Goal  Home visit completed.  Reviewed medications and discharge instructions.  [facility] CM Short Term Goal #1   Patient will report follow up on palliative care at home in the next week   [facility] CM Short Term Goal #1 Start Date  10/03/16  [name] [address] [address] CM Short Term Goal #1 Met Date  10/17/16  Interventions for Short Term Goal #1  Placed call to care connections   THN CM Short Term Goal #2   Patient will report no falls in the next 30 days.   [facility] CM Short Term Goal #2 Start Date  10/10/16  Interventions for Short Term Goal #2  Reviewed fall precautions.   THN CM Short Term Goal #3  Patient will report eating 3 times per day for  the next 21 days.   THN CM Short Term Goal #3 Start Date  10/16/16  Interventions  for Short Tern Goal #3  Reviewed importance of good nutrition and importance of energy.  Reviewed importance of staying hydrated     [name], RN, BSN, [name] [employer] [phone]

## 2016-10-17 ENCOUNTER — Encounter: Payer: Self-pay | Admitting: Vascular Surgery

## 2016-10-17 ENCOUNTER — Ambulatory Visit (INDEPENDENT_AMBULATORY_CARE_PROVIDER_SITE_OTHER): Payer: Self-pay | Admitting: Vascular Surgery

## 2016-10-17 VITALS — BP 106/74 | HR 100 | Temp 97.6°F | Resp 16 | Ht 64.0 in | Wt 102.0 lb

## 2016-10-17 DIAGNOSIS — E118 Type 2 diabetes mellitus with unspecified complications: Secondary | ICD-10-CM | POA: Diagnosis not present

## 2016-10-17 DIAGNOSIS — I214 Non-ST elevation (NSTEMI) myocardial infarction: Secondary | ICD-10-CM | POA: Diagnosis not present

## 2016-10-17 DIAGNOSIS — N2581 Secondary hyperparathyroidism of renal origin: Secondary | ICD-10-CM | POA: Diagnosis not present

## 2016-10-17 DIAGNOSIS — Z48812 Encounter for surgical aftercare following surgery on the circulatory system: Secondary | ICD-10-CM

## 2016-10-17 DIAGNOSIS — D631 Anemia in chronic kidney disease: Secondary | ICD-10-CM | POA: Diagnosis not present

## 2016-10-17 DIAGNOSIS — Z7901 Long term (current) use of anticoagulants: Secondary | ICD-10-CM | POA: Diagnosis not present

## 2016-10-17 DIAGNOSIS — I2510 Atherosclerotic heart disease of native coronary artery without angina pectoris: Secondary | ICD-10-CM | POA: Diagnosis not present

## 2016-10-17 DIAGNOSIS — N186 End stage renal disease: Secondary | ICD-10-CM

## 2016-10-17 DIAGNOSIS — E876 Hypokalemia: Secondary | ICD-10-CM | POA: Diagnosis not present

## 2016-10-17 LAB — PROTIME-INR: INR: 4.6 — AB (ref <=1.1)

## 2016-10-17 NOTE — Progress Notes (Signed)
Patient ID: [name], female   DOB: [birth year], 61 y.o.   MRN: [id number]  Reason for Consult: PAD (Right leg draining/pain.)   Referred by [name] Cuff, MD  Subjective:     HPI:  [name] is a 61 y.o. female history of end-stage renal disease and now bilateral lower extremity below-knee amputations. Her right lower extremity was indicated back in May by Dr. Doren [name]. It has been slow to heal and she now has a scab. A few days ago she noted sanguinous and purulent drainage from a scab site on the amputation site. There then placing a padded dressing over this and she has been taking clindamycin.  she denies any fevers. She is interested in getting a below-knee prosthesis for her left leg. She also recently had a fistula ligated in her left arm for steal and she is doing very well from this aspect as well.  Past Medical History:  Diagnosis Date  . Anemia    takes Folic Acid daily  . Anxiety   . Arthritis    "left hand, back" (08/30/2012)  . Asthma   . CAD (coronary artery disease) Jan. 2015   Cath: 20% LAD, 50% D1; s/p LIMA-LAD  . Chest pain 03/06/2015  . CHF (congestive heart failure) ([place]) 05/2016  . Chronic back pain   . Chronic constipation    takes MIralax and Colace daily  . COPD (chronic obstructive pulmonary disease) ([place])   . Depression    takes Cymbalta for "severe" depression  . ESRD on dialysis [facility])    "MWF; [facility]" (08/12/2016)  . GERD (gastroesophageal reflux disease)    takes Omeprazole daily  . Headache    "at least monthly" (08/30/2012)  . Hepatitis C   . History of blood transfusion    "several" (08/30/2012)  . Hypertension    takes Metoprolol and Lisinopril daily, sees Dr Bea Graff  . Hypothyroidism    takes Synthroid daily  . Migraine    "last migraine was in 2013" (08/30/2012)  . Myocardial infarction ([place])   . Neuromuscular disorder ([place])    carpal tunnel in right hand  . Obesity   . Peripheral vascular disease (HCC) hands and legs  .  Pneumonia    "today and several times before" (08/30/2012)  . 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 S/P bilateral BKA (below knee amputation) ([place]) 04/30/2016  . 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 SVT (supraventricular tachycardia) ([place]) 06/09/14  . Tobacco abuse   . Type II diabetes mellitus (HCC)    Levemir 2units daily if > 150   Family History  Problem Relation Age of Onset  . Cancer Mother   . Diabetes Mother   . Hypertension Mother   . Stroke Mother   . Cancer Father   . Anesthesia problems Neg Hx   . Hypotension Neg Hx   . Malignant hyperthermia Neg Hx   . Pseudochol deficiency Neg Hx    Past Surgical History:  Procedure Laterality Date  . ABDOMINAL AORTOGRAM W/LOWER EXTREMITY N/A 07/04/2016   Procedure: Abdominal Aortogram w/Lower Extremity;  Surgeon: Elam Dutch, MD;  Location: [place] CV LAB;  Service: Cardiovascular;  Laterality: N/A;  . AMPUTATION Left 04/24/2016   Procedure: AMPUTATION BELOW KNEE;  Surgeon: Angelia Mould, MD;  Location: [place];  Service: Vascular;  Laterality: Left;  . AMPUTATION  Right 07/08/2016   Procedure: AMPUTATION BELOW KNEE;  Surgeon: Angelia Mould, MD;  Location: [place];  Service: Vascular;  Laterality: Right;  . AORTIC VALVE REPLACEMENT N/A 03/15/2013   AVR; Surgeon: Ivin Poot, MD;  Location: [facility] OR; Open Heart Surgery;  58mm[employer] mechanical prosthesis, top hat valve  . ARTERIOVENOUS GRAFT PLACEMENT Left 10/03/10    forearm  . AV FISTULA PLACEMENT  01/29/2011   Procedure: INSERTION OF ARTERIOVENOUS (AV) GORE-TEX GRAFT ARM;  Surgeon: Elam Dutch, MD;  Location: [facility] OR;  Service: Vascular;  Laterality: Right;  . AV FISTULA PLACEMENT  03/10/2011   Procedure: INSERTION OF ARTERIOVENOUS (AV) GORE-TEX GRAFT THIGH;  Surgeon: Elam Dutch, MD;  Location: [place];  Service: Vascular;  Laterality: Left;  . AV  FISTULA PLACEMENT Left 07/21/2016   Procedure: CREATION OF LEFT UPPER ARM ARTERIOVENOUS (AV) FISTULA;  Surgeon: Conrad Hall Summit, MD;  Location: [place];  Service: Vascular;  Laterality: Left;  . [place] REMOVAL  12/23/2010   Procedure: REMOVAL OF ARTERIOVENOUS GORETEX GRAFT ([place]);  Surgeon: Elam Dutch, MD;  Location: [place];  Service: Vascular;  Laterality: Left;  procedure started @1736 -1852  . CHOLECYSTECTOMY  1993  . CORONARY ANGIOGRAPHY N/A 08/12/2016   Procedure: CORONARY ANGIOGRAPHY (CATH LAB);  Surgeon: Leonie Man, MD;  Location: [place] CV LAB;  Service: Cardiovascular;  Laterality: N/A;  . CORONARY ARTERY BYPASS GRAFT N/A 03/15/2013   Procedure: CORONARY ARTERY BYPASS GRAFTING (CABG) times one using left internal mammary artery.;  Surgeon: Ivin Poot, MD;  Location: [place];  Service: Open Heart Surgery;  Laterality: N/A;  POSS CABG X 1  . CYSTOSCOPY  1990's  . INSERTION OF DIALYSIS CATHETER  12/23/2010   Procedure: INSERTION OF DIALYSIS CATHETER;  Surgeon: Elam Dutch, MD;  Location: [place];  Service: Vascular;  Laterality: Right;  Right Internal Jugular 28cm dialysis catheter insertion procedure time 1701-1720   . INSERTION OF DIALYSIS CATHETER Right 03/11/2016   Procedure: INSERTION OF DIALYSIS CATHETER;  Surgeon: Angelia Mould, MD;  Location: [place];  Service: Vascular;  Laterality: Right;  . INTRAOPERATIVE TRANSESOPHAGEAL ECHOCARDIOGRAM N/A 03/15/2013   Procedure: INTRAOPERATIVE TRANSESOPHAGEAL ECHOCARDIOGRAM;  Surgeon: Ivin Poot, MD;  Location: [place];  Service: Open Heart Surgery;  Laterality: N/A;  . LEFT HEART CATHETERIZATION WITH CORONARY ANGIOGRAM N/A 07/29/2012   Procedure: LEFT HEART CATHETERIZATION WITH CORONARY ANGIOGRAM;  Surgeon: Troy Sine, MD;  Location: [facility] CATH LAB;  Service: Cardiovascular;  Laterality: N/A;  . LEFT HEART CATHETERIZATION WITH CORONARY ANGIOGRAM N/A 03/10/2013   Procedure: LEFT HEART CATHETERIZATION WITH CORONARY ANGIOGRAM;   Surgeon: Troy Sine, MD;  Location: [facility] CATH LAB;  Service: Cardiovascular;  Laterality: N/A;  . LEFT HEART CATHETERIZATION WITH CORONARY/GRAFT ANGIOGRAM N/A 12/24/2011   Procedure: LEFT HEART CATHETERIZATION WITH Beatrix Fetters;  Surgeon: [name], MD;  Location: [facility] CATH LAB;  Service: Cardiovascular;  Laterality: N/A;  . LEFT HEART CATHETERIZATION WITH CORONARY/GRAFT ANGIOGRAM N/A 12/16/2013   Procedure: LEFT HEART CATHETERIZATION WITH Beatrix Fetters;  Surgeon: Troy Sine, MD;  Location: [facility] CATH LAB;  Service: Cardiovascular;  Laterality: N/A;  . left kidney removed    . LIGATION ARTERIOVENOUS GORTEX GRAFT Left 03/11/2016   Procedure: LIGATION THIGH ARTERIOVENOUS GORTEX GRAFT;  Surgeon: Angelia Mould, MD;  Location: [place];  Service: Vascular;  Laterality: Left;  . LIGATION OF ARTERIOVENOUS  FISTULA Left 08/11/2016   Procedure: LIGATION OF ARTERIOVENOUS  FISTULA;  Surgeon: Elam Dutch, MD;  Location: [facility] OR;  Service: Vascular;  Laterality: Left;  . LIVER TRANSPLANT  10/25/2009   sees Dr Ferol Luz 1 every 6 months, saw last in Dec 2013. [name] Coord [phone]  . PERIPHERAL VASCULAR CATHETERIZATION N/A 11/06/2015   Procedure: Abdominal Aortogram;  Surgeon: Serafina Mitchell, MD;  Location: [place] CV LAB;  Service: Cardiovascular;  Laterality: N/A;  . PERIPHERAL VASCULAR CATHETERIZATION N/A 11/06/2015   Procedure: Lower Extremity Angiography;  Surgeon: Serafina Mitchell, MD;  Location: [place] CV LAB;  Service: Cardiovascular;  Laterality: N/A;  . PERIPHERAL VASCULAR CATHETERIZATION  11/06/2015   Procedure: Peripheral Vascular Atherectomy;  Surgeon: Serafina Mitchell, MD;  Location: [place] CV LAB;  Service: Cardiovascular;;  Left Superficial femoral  . SHUNTOGRAM Left 05/15/2014   Procedure: SHUNTOGRAM;  Surgeon: Conrad Rome, MD;  Location: [facility] CATH LAB;  Service: Cardiovascular;  Laterality: Left;  . SMALL INTESTINE SURGERY  90's  . SPINAL  GROWTH RODS  2010   "put 2 metal rods in my back; they had detetriorated" (08/30/2012)  . THROMBECTOMY    . THROMBECTOMY AND REVISION OF ARTERIOVENTOUS (AV) GORETEX  GRAFT Left 03/30/2014  . THROMBECTOMY AND REVISION OF ARTERIOVENTOUS (AV) GORETEX  GRAFT Left 03/30/2014   Procedure: THROMBECTOMY AND REVISION OF ARTERIOVENTOUS (AV) GORETEX  GRAFT;  Surgeon: Conrad Bailey, MD;  Location: [place];  Service: Vascular;  Laterality: Left;  . TUBAL LIGATION  1990's    Short Social History:  Social History  Substance Use Topics  . Smoking status: Current Every Day Smoker    Packs/day: 0.25    Years: 43.00    Types: Cigarettes  . Smokeless tobacco: Never Used     Comment: 8/18 - down to <0.5 ppd  . Alcohol use No    Allergies  Allergen Reactions  . Acetaminophen Other (See Comments)    Liver transplant recipient   . Morphine And Related Itching  . Codeine Itching  . Mirtazapine Other (See Comments)    hallucination    Current Outpatient Prescriptions  Medication Sig Dispense Refill  . ALPRAZolam (XANAX) 0.5 MG tablet Take 0.5 mg by mouth 3 (three) times daily as needed for anxiety.     . Amino Acids-Protein Hydrolys (FEEDING SUPPLEMENT, PRO-STAT SUGAR FREE 64,) LIQD Take 30 mLs by mouth 2 (two) times daily. 900 mL 0  . aspirin EC 81 MG tablet Take 81 mg by mouth daily at 12 noon.     . calcitRIOL (ROCALTROL) 0.5 MCG capsule Take 1 capsule (0.5 mcg total) by mouth every Monday, Wednesday, and Friday with hemodialysis. 30 capsule 1  . calcium acetate (PHOSLO) 667 MG capsule Take 1 capsule (667 mg total) by mouth 3 (three) times daily with meals. 90 capsule 1  . calcium carbonate (TUMS - DOSED IN MG ELEMENTAL CALCIUM) 500 MG chewable tablet Chew 1 tablet by mouth daily.    . clindamycin (CLEOCIN) 300 MG capsule Take 300 mg by mouth 2 (two) times daily.    . feeding supplement (BOOST / RESOURCE BREEZE) LIQD Take 1 Container by mouth 3 (three) times daily between meals. 1 Container 100  .  gabapentin (NEURONTIN) 300 MG capsule Take 300 mg by mouth at bedtime. Daughter reports patient taking 100 mg on hemodialysis days    . levothyroxine (SYNTHROID, LEVOTHROID) 200 MCG tablet Take 1 tablet (200 mcg total) by mouth daily before breakfast. 30 tablet 1  . lidocaine (LIDODERM) 5 % Place 1 patch onto the skin daily. Remove & Discard patch within  12 hours or as directed by MD 30 patch 0  . methocarbamol (ROBAXIN) 500 MG tablet Take 0.5 tablets (250 mg total) by mouth every 6 (six) hours as needed for muscle spasms. 60 tablet 0  . metoCLOPramide (REGLAN) 5 MG tablet Take 1 tablet (5 mg total) by mouth 2 (two) times daily before a meal. Decrease to once a day before breakfast after a couple of weeks. (Patient taking differently: Take 5 mg by mouth daily as needed for nausea or vomiting. ) 60 tablet 0  . metoprolol tartrate (LOPRESSOR) 25 MG tablet Take 0.5 tablets (12.5 mg total) by mouth 2 (two) times daily. 60 tablet 0  . midodrine (PROAMATINE) 10 MG tablet Take 1 tablet (10 mg total) by mouth 3 (three) times daily with meals. 90 tablet 1  . mirtazapine (REMERON) 15 MG tablet Take 15 mg by mouth at bedtime.   2  . multivitamin (RENA-VIT) TABS tablet Take 1 tablet by mouth at bedtime. 30 tablet 0  . nitroGLYCERIN (NITROSTAT) 0.4 MG SL tablet Place 1 tablet (0.4 mg total) under the tongue every 5 (five) minutes as needed for chest pain. 30 tablet 0  . ondansetron (ZOFRAN ODT) 8 MG disintegrating tablet 8mg  ODT q4 hours prn nausea (Patient taking differently: Take 8 mg by mouth every 4 (four) hours as needed for nausea or vomiting. ) 6 tablet 0  . oxyCODONE (OXY IR/ROXICODONE) 5 MG immediate release tablet Take 1 tablet (5 mg total) by mouth every 4 (four) hours as needed for severe pain. 20 tablet 0  . promethazine (PHENERGAN) 12.5 MG tablet Take 12.5 mg by mouth every 6 (six) hours as needed for nausea or vomiting.    . ranolazine (RANEXA) 500 MG 12 hr tablet Take 1 tablet (500 mg total) by mouth  daily. 30 tablet 0  . tacrolimus (PROGRAF) 1 MG capsule Take 2 mg by mouth 2 (two) times daily.     . traMADol (ULTRAM) 50 MG tablet Take 50 mg by mouth 3 (three) times daily as needed for moderate pain.    . traZODone (DESYREL) 50 MG tablet Take 50 mg by mouth at bedtime.    [name] warfarin (COUMADIN) 2 MG tablet Take 1-2 tablets by mouth daily as directed by coumadin clinic 90 tablet 1   No current facility-administered medications for this visit.     Review of Systems  Cardiovascular: Cardiovascular negative.  GI: Gastrointestinal negative.  Musculoskeletal: Positive for leg pain.  Skin: Positive for wound.  Neurological: Neurological negative.       Objective:  Objective   Vitals:   10/17/16 1403  BP: 106/74  Pulse: 100  Resp: 16  Temp: 97.6 F (36.4 C)  TempSrc: Oral  Weight: 102 lb (46.3 kg)  Height: 5\' 4"  (1.626 m)   Body mass index is 17.51 kg/m.  Physical Exam  Constitutional: She appears well-developed.  HENT:  Head: Normocephalic.  Cardiovascular:  Pulses:      Popliteal pulses are 1+ on the right side, and 1+ on the left side.  Musculoskeletal:  3x2 cm scab on right bka, no drainage expressable, no erythema  Skin: Skin is warm and dry.  Psychiatric: She has a normal mood and affect. Her behavior is normal. Judgment and thought content normal.         Assessment/Plan:     61yo female here for evaluation of right BKA wound. BKA was performed in May of this year. She is now on antibiotics states that there is no further  drainage since that. I will have her follow-up in a couple weeks for reevaluation and a dry bandage was placed today. He does appear that it should heal and she does have palpable popliteal pulses adjust this as well. Given a prescription to get fitted for a left lower extremity below-knee amputation prosthetic but she needs to wait on the right side at this time.      Waynetta Sandy MD [facility]

## 2016-10-18 DIAGNOSIS — T148XXA Other injury of unspecified body region, initial encounter: Secondary | ICD-10-CM | POA: Diagnosis not present

## 2016-10-18 DIAGNOSIS — I959 Hypotension, unspecified: Secondary | ICD-10-CM | POA: Diagnosis not present

## 2016-10-20 ENCOUNTER — Other Ambulatory Visit: Payer: Self-pay | Admitting: *Deleted

## 2016-10-20 ENCOUNTER — Ambulatory Visit (INDEPENDENT_AMBULATORY_CARE_PROVIDER_SITE_OTHER): Payer: Medicare Other | Admitting: Pharmacist Clinician (PhC)/ Clinical Pharmacy Specialist

## 2016-10-20 DIAGNOSIS — I2510 Atherosclerotic heart disease of native coronary artery without angina pectoris: Secondary | ICD-10-CM | POA: Diagnosis not present

## 2016-10-20 DIAGNOSIS — E876 Hypokalemia: Secondary | ICD-10-CM | POA: Diagnosis not present

## 2016-10-20 DIAGNOSIS — Z7901 Long term (current) use of anticoagulants: Secondary | ICD-10-CM

## 2016-10-20 DIAGNOSIS — Z952 Presence of prosthetic heart valve: Secondary | ICD-10-CM | POA: Diagnosis not present

## 2016-10-20 DIAGNOSIS — N2581 Secondary hyperparathyroidism of renal origin: Secondary | ICD-10-CM | POA: Diagnosis not present

## 2016-10-20 DIAGNOSIS — I214 Non-ST elevation (NSTEMI) myocardial infarction: Secondary | ICD-10-CM | POA: Diagnosis not present

## 2016-10-20 DIAGNOSIS — D631 Anemia in chronic kidney disease: Secondary | ICD-10-CM | POA: Diagnosis not present

## 2016-10-20 DIAGNOSIS — I359 Nonrheumatic aortic valve disorder, unspecified: Secondary | ICD-10-CM | POA: Diagnosis not present

## 2016-10-20 DIAGNOSIS — E118 Type 2 diabetes mellitus with unspecified complications: Secondary | ICD-10-CM | POA: Diagnosis not present

## 2016-10-20 DIAGNOSIS — N186 End stage renal disease: Secondary | ICD-10-CM | POA: Diagnosis not present

## 2016-10-20 LAB — PROTIME-INR: INR: 3.4 — AB (ref 0.9–1.1)

## 2016-10-20 NOTE — Patient Outreach (Signed)
[facility]) Care Management  10/20/2016  Late Entry  CHAUNDRA ABREU 1955-12-23 [id number]  CSW was able to make initial contact with patient today.  CSW introduced self, explained role and types of services provided through [facility] ([facility]).  CSW further explained to patient that CSW works with patient's RNCM, also with [facility], . CSW then explained the reason for the call, indicating that patient's RNCM thought that patient would benefit from social work services and resources to assist with transportation, Medicaid and other community resources .  CSW obtained two HIPAA compliant identifiers from patient, which included patient's name and date of birth. Patient had [facility] visiting in the home at time of CSW call.  Patient requested a f/u call at a later time when not busy with [facility] visit.   CSW to callback this week to complete assessment.  Eduard Clos, MSW, [place] Worker  [place] [phone]

## 2016-10-21 DIAGNOSIS — I2510 Atherosclerotic heart disease of native coronary artery without angina pectoris: Secondary | ICD-10-CM | POA: Diagnosis not present

## 2016-10-21 DIAGNOSIS — I214 Non-ST elevation (NSTEMI) myocardial infarction: Secondary | ICD-10-CM | POA: Diagnosis not present

## 2016-10-22 ENCOUNTER — Ambulatory Visit (INDEPENDENT_AMBULATORY_CARE_PROVIDER_SITE_OTHER): Payer: Medicare Other | Admitting: Pharmacist

## 2016-10-22 ENCOUNTER — Ambulatory Visit: Payer: Self-pay | Admitting: *Deleted

## 2016-10-22 DIAGNOSIS — Z7901 Long term (current) use of anticoagulants: Secondary | ICD-10-CM

## 2016-10-22 DIAGNOSIS — I214 Non-ST elevation (NSTEMI) myocardial infarction: Secondary | ICD-10-CM | POA: Diagnosis not present

## 2016-10-22 DIAGNOSIS — I471 Supraventricular tachycardia: Secondary | ICD-10-CM

## 2016-10-22 DIAGNOSIS — D631 Anemia in chronic kidney disease: Secondary | ICD-10-CM | POA: Diagnosis not present

## 2016-10-22 DIAGNOSIS — I2510 Atherosclerotic heart disease of native coronary artery without angina pectoris: Secondary | ICD-10-CM | POA: Diagnosis not present

## 2016-10-22 DIAGNOSIS — E876 Hypokalemia: Secondary | ICD-10-CM | POA: Diagnosis not present

## 2016-10-22 DIAGNOSIS — N2581 Secondary hyperparathyroidism of renal origin: Secondary | ICD-10-CM | POA: Diagnosis not present

## 2016-10-22 DIAGNOSIS — E118 Type 2 diabetes mellitus with unspecified complications: Secondary | ICD-10-CM | POA: Diagnosis not present

## 2016-10-22 DIAGNOSIS — N186 End stage renal disease: Secondary | ICD-10-CM | POA: Diagnosis not present

## 2016-10-22 LAB — POCT INR: INR: 1.3

## 2016-10-23 ENCOUNTER — Other Ambulatory Visit: Payer: Self-pay

## 2016-10-23 ENCOUNTER — Other Ambulatory Visit: Payer: Self-pay | Admitting: *Deleted

## 2016-10-23 DIAGNOSIS — H524 Presbyopia: Secondary | ICD-10-CM | POA: Diagnosis not present

## 2016-10-23 DIAGNOSIS — H5203 Hypermetropia, bilateral: Secondary | ICD-10-CM | POA: Diagnosis not present

## 2016-10-23 DIAGNOSIS — I214 Non-ST elevation (NSTEMI) myocardial infarction: Secondary | ICD-10-CM | POA: Diagnosis not present

## 2016-10-23 DIAGNOSIS — H52223 Regular astigmatism, bilateral: Secondary | ICD-10-CM | POA: Diagnosis not present

## 2016-10-23 DIAGNOSIS — H25813 Combined forms of age-related cataract, bilateral: Secondary | ICD-10-CM | POA: Diagnosis not present

## 2016-10-23 DIAGNOSIS — I2510 Atherosclerotic heart disease of native coronary artery without angina pectoris: Secondary | ICD-10-CM | POA: Diagnosis not present

## 2016-10-23 NOTE — Patient Outreach (Signed)
[facility]) Care Management  10/23/2016  [name] 10-16-1955 [id number]                CSW made a phone follow up call today in an attempt to try and contact patient today to perform phone assessment. CSW spoke with patient earlier this week briefly but she had [facility] visiting and asked for callback.  A HIPAA compliant message was left for patient on both home and cell voicemail. CSW will try again in 1- 2 weeks.    Eduard Clos, MSW, [place] Worker  [place] [phone]

## 2016-10-23 NOTE — Patient Outreach (Signed)
Transition of care:  Patient discussed in difficult case review today.  Placed call to patient who reports that she is doing well. Reports that she continues to be weak. Reports no more drainage from stump. Reports that dialysis is going well.  Patient reports that she is managing well since her daughter started working this week.  Denies any new problems or concerns.  PLAN: will continue weekly transition of care calls.  [name], RN, BSN, CEN [facility] [employer] [phone]

## 2016-10-24 DIAGNOSIS — K5641 Fecal impaction: Secondary | ICD-10-CM | POA: Diagnosis not present

## 2016-10-24 DIAGNOSIS — E876 Hypokalemia: Secondary | ICD-10-CM | POA: Diagnosis not present

## 2016-10-24 DIAGNOSIS — N2581 Secondary hyperparathyroidism of renal origin: Secondary | ICD-10-CM | POA: Diagnosis not present

## 2016-10-24 DIAGNOSIS — N186 End stage renal disease: Secondary | ICD-10-CM | POA: Diagnosis not present

## 2016-10-24 DIAGNOSIS — D631 Anemia in chronic kidney disease: Secondary | ICD-10-CM | POA: Diagnosis not present

## 2016-10-24 DIAGNOSIS — K5903 Drug induced constipation: Secondary | ICD-10-CM | POA: Diagnosis not present

## 2016-10-24 DIAGNOSIS — E118 Type 2 diabetes mellitus with unspecified complications: Secondary | ICD-10-CM | POA: Diagnosis not present

## 2016-10-27 ENCOUNTER — Encounter: Payer: Self-pay | Admitting: *Deleted

## 2016-10-27 ENCOUNTER — Other Ambulatory Visit: Payer: Self-pay | Admitting: *Deleted

## 2016-10-27 DIAGNOSIS — D631 Anemia in chronic kidney disease: Secondary | ICD-10-CM | POA: Diagnosis not present

## 2016-10-27 DIAGNOSIS — E876 Hypokalemia: Secondary | ICD-10-CM | POA: Diagnosis not present

## 2016-10-27 DIAGNOSIS — N186 End stage renal disease: Secondary | ICD-10-CM | POA: Diagnosis not present

## 2016-10-27 DIAGNOSIS — E118 Type 2 diabetes mellitus with unspecified complications: Secondary | ICD-10-CM | POA: Diagnosis not present

## 2016-10-27 DIAGNOSIS — Z7901 Long term (current) use of anticoagulants: Secondary | ICD-10-CM | POA: Diagnosis not present

## 2016-10-27 DIAGNOSIS — I359 Nonrheumatic aortic valve disorder, unspecified: Secondary | ICD-10-CM | POA: Diagnosis not present

## 2016-10-27 DIAGNOSIS — N2581 Secondary hyperparathyroidism of renal origin: Secondary | ICD-10-CM | POA: Diagnosis not present

## 2016-10-27 LAB — PROTIME-INR: INR: 6 — AB (ref <=1.1)

## 2016-10-27 NOTE — Patient Outreach (Signed)
[facility]) Care Management  10/27/2016  CHELE CORNELL 02-Sep-1955 [id number]   CSW made a third and final attempt to try and contact patient today to perform phone assessment, as well as assess and assist with social work needs and services, without success.  A HIPAA compliant message was left for patient on voicemail.  CSW  continues to await a return call.  CSW will mail an outreach letter to patient's home, encouraging patient to contact CSW at their earliest convenience, if patient is interested in receiving social work services through [place] with [facility].  If CSW does not receive a return call from patient within the next 10 business days, CSW will proceed with case closure.  Required number of phone attempts will have been made and outreach letter mailed.   Nat Christen, BSW, MSW, LCSW  Licensed Education officer, [facility]  Mailing [place] [address] [place], [place] [address] Physical Address-[address], [place],  [address] Toll Free Main # [phone] Fax # [phone] Cell # [phone]  Office # [phone] [email or web address]

## 2016-10-28 ENCOUNTER — Ambulatory Visit (INDEPENDENT_AMBULATORY_CARE_PROVIDER_SITE_OTHER): Payer: Medicare Other | Admitting: Pharmacist Clinician (PhC)/ Clinical Pharmacy Specialist

## 2016-10-28 DIAGNOSIS — Z7901 Long term (current) use of anticoagulants: Secondary | ICD-10-CM

## 2016-10-28 DIAGNOSIS — I214 Non-ST elevation (NSTEMI) myocardial infarction: Secondary | ICD-10-CM | POA: Diagnosis not present

## 2016-10-28 DIAGNOSIS — I2510 Atherosclerotic heart disease of native coronary artery without angina pectoris: Secondary | ICD-10-CM | POA: Diagnosis not present

## 2016-10-28 DIAGNOSIS — Z952 Presence of prosthetic heart valve: Secondary | ICD-10-CM

## 2016-10-28 LAB — PROTIME-INR

## 2016-10-29 ENCOUNTER — Inpatient Hospital Stay (HOSPITAL_COMMUNITY)
Admission: EM | Admit: 2016-10-29 | Discharge: 2016-11-01 | DRG: 313 | Disposition: A | Discharge status: Home / Self Care | Payer: Medicare Other | Attending: Internal Medicine | Admitting: Internal Medicine

## 2016-10-29 ENCOUNTER — Encounter (HOSPITAL_COMMUNITY): Payer: Self-pay

## 2016-10-29 ENCOUNTER — Emergency Department (HOSPITAL_COMMUNITY): Payer: Medicare Other

## 2016-10-29 ENCOUNTER — Other Ambulatory Visit: Payer: Self-pay

## 2016-10-29 DIAGNOSIS — N186 End stage renal disease: Secondary | ICD-10-CM | POA: Diagnosis present

## 2016-10-29 DIAGNOSIS — E1151 Type 2 diabetes mellitus with diabetic peripheral angiopathy without gangrene: Secondary | ICD-10-CM | POA: Diagnosis present

## 2016-10-29 DIAGNOSIS — K219 Gastro-esophageal reflux disease without esophagitis: Secondary | ICD-10-CM | POA: Diagnosis not present

## 2016-10-29 DIAGNOSIS — R0602 Shortness of breath: Secondary | ICD-10-CM | POA: Diagnosis not present

## 2016-10-29 DIAGNOSIS — D631 Anemia in chronic kidney disease: Secondary | ICD-10-CM | POA: Diagnosis present

## 2016-10-29 DIAGNOSIS — I052 Rheumatic mitral stenosis with insufficiency: Secondary | ICD-10-CM | POA: Diagnosis present

## 2016-10-29 DIAGNOSIS — Z944 Liver transplant status: Secondary | ICD-10-CM

## 2016-10-29 DIAGNOSIS — J449 Chronic obstructive pulmonary disease, unspecified: Secondary | ICD-10-CM | POA: Diagnosis present

## 2016-10-29 DIAGNOSIS — I252 Old myocardial infarction: Secondary | ICD-10-CM

## 2016-10-29 DIAGNOSIS — Z7901 Long term (current) use of anticoagulants: Secondary | ICD-10-CM

## 2016-10-29 DIAGNOSIS — Z888 Allergy status to other drugs, medicaments and biological substances status: Secondary | ICD-10-CM

## 2016-10-29 DIAGNOSIS — I739 Peripheral vascular disease, unspecified: Secondary | ICD-10-CM | POA: Diagnosis present

## 2016-10-29 DIAGNOSIS — G9341 Metabolic encephalopathy: Secondary | ICD-10-CM | POA: Diagnosis not present

## 2016-10-29 DIAGNOSIS — Z681 Body mass index (BMI) 19 or less, adult: Secondary | ICD-10-CM

## 2016-10-29 DIAGNOSIS — R079 Chest pain, unspecified: Secondary | ICD-10-CM | POA: Diagnosis not present

## 2016-10-29 DIAGNOSIS — Z89511 Acquired absence of right leg below knee: Secondary | ICD-10-CM

## 2016-10-29 DIAGNOSIS — I2510 Atherosclerotic heart disease of native coronary artery without angina pectoris: Secondary | ICD-10-CM | POA: Diagnosis not present

## 2016-10-29 DIAGNOSIS — Z992 Dependence on renal dialysis: Secondary | ICD-10-CM

## 2016-10-29 DIAGNOSIS — N2581 Secondary hyperparathyroidism of renal origin: Secondary | ICD-10-CM | POA: Diagnosis present

## 2016-10-29 DIAGNOSIS — E43 Unspecified severe protein-calorie malnutrition: Secondary | ICD-10-CM | POA: Diagnosis not present

## 2016-10-29 DIAGNOSIS — R0789 Other chest pain: Principal | ICD-10-CM | POA: Diagnosis present

## 2016-10-29 DIAGNOSIS — Z79899 Other long term (current) drug therapy: Secondary | ICD-10-CM

## 2016-10-29 DIAGNOSIS — F17210 Nicotine dependence, cigarettes, uncomplicated: Secondary | ICD-10-CM | POA: Diagnosis present

## 2016-10-29 DIAGNOSIS — I25119 Atherosclerotic heart disease of native coronary artery with unspecified angina pectoris: Secondary | ICD-10-CM

## 2016-10-29 DIAGNOSIS — Z8619 Personal history of other infectious and parasitic diseases: Secondary | ICD-10-CM

## 2016-10-29 DIAGNOSIS — Z66 Do not resuscitate: Secondary | ICD-10-CM | POA: Diagnosis present

## 2016-10-29 DIAGNOSIS — Z951 Presence of aortocoronary bypass graft: Secondary | ICD-10-CM

## 2016-10-29 DIAGNOSIS — L89152 Pressure ulcer of sacral region, stage 2: Secondary | ICD-10-CM | POA: Diagnosis present

## 2016-10-29 DIAGNOSIS — E118 Type 2 diabetes mellitus with unspecified complications: Secondary | ICD-10-CM | POA: Diagnosis not present

## 2016-10-29 DIAGNOSIS — I5042 Chronic combined systolic (congestive) and diastolic (congestive) heart failure: Secondary | ICD-10-CM | POA: Diagnosis present

## 2016-10-29 DIAGNOSIS — E1142 Type 2 diabetes mellitus with diabetic polyneuropathy: Secondary | ICD-10-CM | POA: Diagnosis present

## 2016-10-29 DIAGNOSIS — Z833 Family history of diabetes mellitus: Secondary | ICD-10-CM

## 2016-10-29 DIAGNOSIS — Z79891 Long term (current) use of opiate analgesic: Secondary | ICD-10-CM

## 2016-10-29 DIAGNOSIS — I953 Hypotension of hemodialysis: Secondary | ICD-10-CM | POA: Diagnosis not present

## 2016-10-29 DIAGNOSIS — Z8249 Family history of ischemic heart disease and other diseases of the circulatory system: Secondary | ICD-10-CM

## 2016-10-29 DIAGNOSIS — E039 Hypothyroidism, unspecified: Secondary | ICD-10-CM | POA: Diagnosis present

## 2016-10-29 DIAGNOSIS — E876 Hypokalemia: Secondary | ICD-10-CM | POA: Diagnosis not present

## 2016-10-29 DIAGNOSIS — I25110 Atherosclerotic heart disease of native coronary artery with unstable angina pectoris: Secondary | ICD-10-CM | POA: Diagnosis not present

## 2016-10-29 DIAGNOSIS — I132 Hypertensive heart and chronic kidney disease with heart failure and with stage 5 chronic kidney disease, or end stage renal disease: Secondary | ICD-10-CM | POA: Diagnosis not present

## 2016-10-29 DIAGNOSIS — R69 Illness, unspecified: Secondary | ICD-10-CM

## 2016-10-29 DIAGNOSIS — Z885 Allergy status to narcotic agent status: Secondary | ICD-10-CM

## 2016-10-29 DIAGNOSIS — E1122 Type 2 diabetes mellitus with diabetic chronic kidney disease: Secondary | ICD-10-CM | POA: Diagnosis not present

## 2016-10-29 DIAGNOSIS — F329 Major depressive disorder, single episode, unspecified: Secondary | ICD-10-CM | POA: Diagnosis present

## 2016-10-29 DIAGNOSIS — Z952 Presence of prosthetic heart valve: Secondary | ICD-10-CM

## 2016-10-29 DIAGNOSIS — K5900 Constipation, unspecified: Secondary | ICD-10-CM | POA: Diagnosis present

## 2016-10-29 DIAGNOSIS — Z89512 Acquired absence of left leg below knee: Secondary | ICD-10-CM

## 2016-10-29 LAB — BASIC METABOLIC PANEL
Anion gap: 8 (ref 5–15)
BUN: 7 mg/dL (ref 6–20)
CALCIUM: 8.4 mg/dL — AB (ref 8.9–10.3)
CO2: 26 mmol/L (ref 22–32)
CREATININE: 2.97 mg/dL — AB (ref 0.44–1.00)
Chloride: 99 mmol/L — ABNORMAL LOW (ref 101–111)
GFR calc Af Amer: 18 mL/min — ABNORMAL LOW (ref >60)
GFR calc non Af Amer: 16 mL/min — ABNORMAL LOW (ref >60)
GLUCOSE: 151 mg/dL — AB (ref 65–99)
POTASSIUM: 5.8 mmol/L — AB (ref 3.5–5.1)
Sodium: 133 mmol/L — ABNORMAL LOW (ref 135–145)

## 2016-10-29 LAB — CBC
HCT: 33.9 % — ABNORMAL LOW (ref 36.0–46.0)
Hemoglobin: 11 g/dL — ABNORMAL LOW (ref 12.0–15.0)
MCH: 29.3 pg (ref 26.0–34.0)
MCHC: 32.4 g/dL (ref 30.0–36.0)
MCV: 90.2 fL (ref 78.0–100.0)
PLATELETS: 233 10*3/uL (ref 150–400)
RBC: 3.76 MIL/uL — ABNORMAL LOW (ref 3.87–5.11)
RDW: 19.7 % — AB (ref 11.5–15.5)
WBC: 11.4 10*3/uL — AB (ref 4.0–10.5)

## 2016-10-29 LAB — HEPATIC FUNCTION PANEL
ALT: 19 U/L (ref 14–54)
AST: 46 U/L — ABNORMAL HIGH (ref 15–41)
Albumin: 1.9 g/dL — ABNORMAL LOW (ref 3.5–5.0)
Alkaline Phosphatase: 167 U/L — ABNORMAL HIGH (ref 38–126)
BILIRUBIN DIRECT: 0.9 mg/dL — AB (ref 0.1–0.5)
BILIRUBIN INDIRECT: 0.9 mg/dL (ref 0.3–0.9)
TOTAL PROTEIN: 6.8 g/dL (ref 6.5–8.1)
Total Bilirubin: 1.8 mg/dL — ABNORMAL HIGH (ref 0.3–1.2)

## 2016-10-29 LAB — I-STAT ARTERIAL BLOOD GAS, ED
ACID-BASE EXCESS: 5 mmol/L — AB (ref 0.0–2.0)
Bicarbonate: 29.7 mmol/L — ABNORMAL HIGH (ref 20.0–28.0)
O2 SAT: 95 %
PCO2 ART: 41 mmHg (ref 32.0–48.0)
PH ART: 7.467 — AB (ref 7.350–7.450)
Patient temperature: 98
TCO2: 31 mmol/L (ref 22–32)
pO2, Arterial: 68 mmHg — ABNORMAL LOW (ref 83.0–108.0)

## 2016-10-29 LAB — AMMONIA: AMMONIA: 42 umol/L — AB (ref 9–35)

## 2016-10-29 LAB — I-STAT TROPONIN, ED: Troponin i, poc: 0.54 ng/mL (ref 0.00–0.08)

## 2016-10-29 LAB — PROTIME-INR
INR: 3.69
PROTHROMBIN TIME: 36.3 s — AB (ref 11.4–15.2)

## 2016-10-29 NOTE — ED Provider Notes (Signed)
[facility] DEPT Provider Note   CSN: [id number] Arrival date & time: 10/29/16  1851     History   Chief Complaint Chief Complaint  Patient presents with  . Chest Pain    HPI [name] is a 61 y.o. female.  HPI Patient completed regular scheduled dialysis session this morning. When back home with her care provider she complained of chest pain and shortness of breath. Patient seems very fatigued and generally weak. She is a poor historian and does not give good history of quality and severity of pain. The patient's daughter reports that the care provider called her work to notify her of her mother's complaints. The daughter reports that her mother has severe heart disease and per discussion with cardiology, they are to anticipate the patient will have another heart attack. Patient daughter reports that usually the patient is pretty lively and interactive. She is atypically quiet and withdrawn today.  Past Medical History:  Diagnosis Date  . Anemia    takes Folic Acid daily  . Anxiety   . Arthritis    "left hand, back" (08/30/2012)  . Asthma   . CAD (coronary artery disease) Jan. 2015   Cath: 20% LAD, 50% D1; s/p LIMA-LAD  . Chest pain 03/06/2015  . CHF (congestive heart failure) ([place]) 05/2016  . Chronic back pain   . Chronic constipation    takes MIralax and Colace daily  . COPD (chronic obstructive pulmonary disease) ([place])   . Depression    takes Cymbalta for "severe" depression  . ESRD on dialysis [facility])    "MWF; [facility]" (08/12/2016)  . GERD (gastroesophageal reflux disease)    takes Omeprazole daily  . Headache    "at least monthly" (08/30/2012)  . Hepatitis C   . History of blood transfusion    "several" (08/30/2012)  . Hypertension    takes Metoprolol and Lisinopril daily, sees Dr Bea Graff  . Hypothyroidism    takes Synthroid daily  . Migraine    "last migraine was in 2013" (08/30/2012)  . Myocardial infarction ([place])   . Neuromuscular disorder ([place])    carpal tunnel in right hand  . Obesity   . Peripheral vascular disease (HCC) hands and legs  . Pneumonia    "today and several times before" (08/30/2012)  . 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 S/P bilateral BKA (below knee amputation) ([place]) 04/30/2016  . 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 SVT (supraventricular tachycardia) ([place]) 06/09/14  . Tobacco abuse   . Type II diabetes mellitus (HCC)    Levemir 2units daily if > 150    Patient Active Problem List   Diagnosis Date Noted  . NSVT (nonsustained ventricular tachycardia) ([place])   . Hyponatremia 10/07/2016  . NSTEMI (non-ST elevated myocardial infarction) ([place])   . Acute encephalopathy 09/29/2016  . Syncope 09/28/2016  . Protein-calorie malnutrition, severe 08/13/2016  . Acute CHF (congestive heart failure) ([place]) 08/09/2016  . Fluid overload 07/31/2016  . SOB (shortness of breath)   . Type 2 diabetes mellitus with peripheral neuropathy (HCC)   . H/O mitral valve replacement with mechanical valve   . Amputation of right lower extremity below knee ([place]) 07/10/2016  . History of liver transplant ([place])   . Post-operative pain   . S/P bilateral BKA (below knee amputation) ([place])   . Malnutrition of moderate degree 07/01/2016  .  Cellulitis 06/30/2016  . PAD (peripheral artery disease) ([place]) 06/30/2016  . Diabetic foot infection ([place]) 06/30/2016  . Pressure injury of skin 05/28/2016  . Volume overload 05/28/2016  . Acute exacerbation of CHF (congestive heart failure) ([place]) 05/27/2016  . Extremity atherosclerosis with resting pain ([place])   . H/O heart valve replacement with mechanical valve   . Phantom limb pain ([place])   . Subtherapeutic international normalized ratio (INR)   . Supratherapeutic INR   . Anemia of chronic disease   . Chronic midline low back pain without sciatica   . Type 2 diabetes mellitus with complication, with  long-term current use of insulin ([place])   . S/P unilateral BKA (below knee amputation), left ([place]) 04/30/2016  . Abnormality of gait   . Coronary artery disease involving coronary bypass graft of native heart without angina pectoris   . Benign essential HTN   . History of migraine   . Leukocytosis   . Critical lower limb ischemia 04/22/2016  . Foot pain 04/21/2016  . Fever   . Advance care planning   . Goals of care, counseling/discussion   . Palliative care by specialist   . Sepsis ([place])   . Hypotension   . Claudication of left lower extremity ([place])   . Elevated troponin 03/05/2016  . Pressure injury of skin, stage 2 11/27/2015  . H/O unilateral nephrectomy 07/06/2015  . Paroxysmal SVT (supraventricular tachycardia) ([place]) 06/22/2014  . Complications due to renal dialysis device, implant, and graft 05/11/2014  . Acute blood loss anemia 03/31/2014  . Renal dialysis device, implant, or graft complication 16/11/9602  . Acute hyperkalemia   . S/P liver transplant ([place])   . Chronic hepatitis C without hepatic coma ([place])   . Pulmonary edema 01/03/2014  . Respiratory failure ([place]) 01/03/2014  . Long term current use of anticoagulant therapy 04/22/2013  . Chronic anticoagulation w/ coumadin, goal INR 2.5-3.0 w/ AVR 04/14/2013  . Tobacco abuse 02/28/2013  . COPD (chronic obstructive pulmonary disease) ([place]) 02/21/2013  . Chronic combined systolic and diastolic congestive heart failure ([place]) 02/06/2013  . Acute on chronic respiratory failure with hypoxia ([place]) 11/02/2012  . Acute pulmonary edema ([place]) 11/02/2012  . Acute on chronic systolic CHF (congestive heart failure) ([place]) 11/02/2012  . Dyslipidemia-LDL 104, not on statin with Hx of liver transplant 07/30/2012  . CAD (coronary artery disease), native coronary artery 07/27/2012  . History of prosthetic aortic valve replacement   . End stage renal disease on dialysis ([place]) 02/27/2011  . Hypothyroidism 06/25/2006  . Type 2 diabetes  mellitus with chronic kidney disease ([place]) 06/25/2006  . GERD 06/25/2006  . Anemia due to chronic renal failure    . Hypertensive heart disease     Past Surgical History:  Procedure Laterality Date  . ABDOMINAL AORTOGRAM W/LOWER EXTREMITY N/A 07/04/2016   Procedure: Abdominal Aortogram w/Lower Extremity;  Surgeon: Elam Dutch, MD;  Location: [place] CV LAB;  Service: Cardiovascular;  Laterality: N/A;  . AMPUTATION Left 04/24/2016   Procedure: AMPUTATION BELOW KNEE;  Surgeon: Angelia Mould, MD;  Location: [place];  Service: Vascular;  Laterality: Left;  . AMPUTATION Right 07/08/2016   Procedure: AMPUTATION BELOW KNEE;  Surgeon: Angelia Mould, MD;  Location: [employer];  Service: Vascular;  Laterality: Right;  . AORTIC VALVE REPLACEMENT N/A 03/15/2013   AVR; Surgeon: Ivin Poot, MD;  Location: [facility] OR; Open Heart Surgery;  23mm[employer] mechanical prosthesis, top hat valve  . ARTERIOVENOUS GRAFT PLACEMENT Left 10/03/10    forearm  .  AV FISTULA PLACEMENT  01/29/2011   Procedure: INSERTION OF ARTERIOVENOUS (AV) GORE-TEX GRAFT ARM;  Surgeon: Elam Dutch, MD;  Location: [facility] OR;  Service: Vascular;  Laterality: Right;  . AV FISTULA PLACEMENT  03/10/2011   Procedure: INSERTION OF ARTERIOVENOUS (AV) GORE-TEX GRAFT THIGH;  Surgeon: Elam Dutch, MD;  Location: [facility];  Service: Vascular;  Laterality: Left;  . AV FISTULA PLACEMENT Left 07/21/2016   Procedure: CREATION OF LEFT UPPER ARM ARTERIOVENOUS (AV) FISTULA;  Surgeon: Conrad Percival, MD;  Location: [place];  Service: Vascular;  Laterality: Left;  . [place] REMOVAL  12/23/2010   Procedure: REMOVAL OF ARTERIOVENOUS GORETEX GRAFT ([place]);  Surgeon: Elam Dutch, MD;  Location: [facility];  Service: Vascular;  Laterality: Left;  procedure started @1736 -1852  . CHOLECYSTECTOMY  1993  . CORONARY ANGIOGRAPHY N/A 08/12/2016   Procedure: CORONARY ANGIOGRAPHY (CATH LAB);  Surgeon: Leonie Man, MD;  Location: [place] CV LAB;   Service: Cardiovascular;  Laterality: N/A;  . CORONARY ARTERY BYPASS GRAFT N/A 03/15/2013   Procedure: CORONARY ARTERY BYPASS GRAFTING (CABG) times one using left internal mammary artery.;  Surgeon: Ivin Poot, MD;  Location: [place];  Service: Open Heart Surgery;  Laterality: N/A;  POSS CABG X 1  . CYSTOSCOPY  1990's  . INSERTION OF DIALYSIS CATHETER  12/23/2010   Procedure: INSERTION OF DIALYSIS CATHETER;  Surgeon: Elam Dutch, MD;  Location: [facility];  Service: Vascular;  Laterality: Right;  Right Internal Jugular 28cm dialysis catheter insertion procedure time 1701-1720   . INSERTION OF DIALYSIS CATHETER Right 03/11/2016   Procedure: INSERTION OF DIALYSIS CATHETER;  Surgeon: Angelia Mould, MD;  Location: [facility];  Service: Vascular;  Laterality: Right;  . INTRAOPERATIVE TRANSESOPHAGEAL ECHOCARDIOGRAM N/A 03/15/2013   Procedure: INTRAOPERATIVE TRANSESOPHAGEAL ECHOCARDIOGRAM;  Surgeon: Ivin Poot, MD;  Location: [facility];  Service: Open Heart Surgery;  Laterality: N/A;  . LEFT HEART CATHETERIZATION WITH CORONARY ANGIOGRAM N/A 07/29/2012   Procedure: LEFT HEART CATHETERIZATION WITH CORONARY ANGIOGRAM;  Surgeon: Troy Sine, MD;  Location: [facility] CATH LAB;  Service: Cardiovascular;  Laterality: N/A;  . LEFT HEART CATHETERIZATION WITH CORONARY ANGIOGRAM N/A 03/10/2013   Procedure: LEFT HEART CATHETERIZATION WITH CORONARY ANGIOGRAM;  Surgeon: Troy Sine, MD;  Location: [facility] CATH LAB;  Service: Cardiovascular;  Laterality: N/A;  . LEFT HEART CATHETERIZATION WITH CORONARY/GRAFT ANGIOGRAM N/A 12/24/2011   Procedure: LEFT HEART CATHETERIZATION WITH Beatrix Fetters;  Surgeon: [name], MD;  Location: [facility] CATH LAB;  Service: Cardiovascular;  Laterality: N/A;  . LEFT HEART CATHETERIZATION WITH CORONARY/GRAFT ANGIOGRAM N/A 12/16/2013   Procedure: LEFT HEART CATHETERIZATION WITH Beatrix Fetters;  Surgeon: Troy Sine, MD;  Location[facility] LAB;  Service: Cardiovascular;   Laterality: N/A;  . left kidney removed    . LIGATION ARTERIOVENOUS GORTEX GRAFT Left 03/11/2016   Procedure: LIGATION THIGH ARTERIOVENOUS GORTEX GRAFT;  Surgeon: Angelia Mould, MD;  Location: [facility];  Service: Vascular;  Laterality: Left;  . LIGATION OF ARTERIOVENOUS  FISTULA Left 08/11/2016   Procedure: LIGATION OF ARTERIOVENOUS  FISTULA;  Surgeon: Elam Dutch, MD;  Location: [place];  Service: Vascular;  Laterality: Left;  . LIVER TRANSPLANT  10/25/2009   sees Dr Ferol Luz 1 every 6 months, saw last in Dec 2013. [name] Coord [phone]  . PERIPHERAL VASCULAR CATHETERIZATION N/A 11/06/2015   Procedure: Abdominal Aortogram;  Surgeon: Serafina Mitchell, MD;  Location: [facility] CV LAB;  Service: Cardiovascular;  Laterality: N/A;  . PERIPHERAL  VASCULAR CATHETERIZATION N/A 11/06/2015   Procedure: Lower Extremity Angiography;  Surgeon: Serafina Mitchell, MD;  Location: [place] CV LAB;  Service: Cardiovascular;  Laterality: N/A;  . PERIPHERAL VASCULAR CATHETERIZATION  11/06/2015   Procedure: Peripheral Vascular Atherectomy;  Surgeon: Serafina Mitchell, MD;  Location: [place] CV LAB;  Service: Cardiovascular;;  Left Superficial femoral  . SHUNTOGRAM Left 05/15/2014   Procedure: SHUNTOGRAM;  Surgeon: Conrad [place], MD;  Location: [facility] CATH LAB;  Service: Cardiovascular;  Laterality: Left;  . SMALL INTESTINE SURGERY  90's  . SPINAL GROWTH RODS  2010   "put 2 metal rods in my back; they had detetriorated" (08/30/2012)  . THROMBECTOMY    . THROMBECTOMY AND REVISION OF ARTERIOVENTOUS (AV) GORETEX  GRAFT Left 03/30/2014  . THROMBECTOMY AND REVISION OF ARTERIOVENTOUS (AV) GORETEX  GRAFT Left 03/30/2014   Procedure: THROMBECTOMY AND REVISION OF ARTERIOVENTOUS (AV) GORETEX  GRAFT;  Surgeon: Conrad Crouch, MD;  Location: [facility];  Service: Vascular;  Laterality: Left;  . TUBAL LIGATION  1990's    OB History    No data available       Home Medications    Prior to Admission medications     Medication Sig Start Date End Date Taking? Authorizing Provider  ALPRAZolam Duanne Moron) 0.5 MG tablet Take 0.5 mg by mouth 3 (three) times daily as needed for anxiety.  08/26/16   [provider]  Amino Acids-Protein Hydrolys (FEEDING SUPPLEMENT, PRO-STAT SUGAR FREE 64,) LIQD Take 30 mLs by mouth 2 (two) times daily. 08/28/16   Lavina Hamman, MD  aspirin EC 81 MG tablet Take 81 mg by mouth daily at 12 noon.     [provider]  calcitRIOL (ROCALTROL) 0.5 MCG capsule Take 1 capsule (0.5 mcg total) by mouth every Monday, Wednesday, and Friday with hemodialysis. 07/25/16   Angiulli, Lavon Paganini, PA-C  calcium acetate (PHOSLO) 667 MG capsule Take 1 capsule (667 mg total) by mouth 3 (three) times daily with meals. 07/24/16   Angiulli, Lavon Paganini, PA-C  calcium carbonate (TUMS - DOSED IN MG ELEMENTAL CALCIUM) 500 MG chewable tablet Chew 1 tablet by mouth daily.    [provider]  clindamycin (CLEOCIN) 300 MG capsule Take 300 mg by mouth 2 (two) times daily.    [provider]  feeding supplement (BOOST / RESOURCE BREEZE) LIQD Take 1 Container by mouth 3 (three) times daily between meals. 04/25/16   Reyne Dumas, MD  gabapentin (NEURONTIN) 300 MG capsule Take 300 mg by mouth at bedtime. Daughter reports patient taking 100 mg on hemodialysis days    [provider]  levothyroxine (SYNTHROID, LEVOTHROID) 200 MCG tablet Take 1 tablet (200 mcg total) by mouth daily before breakfast. 10/03/16   Barton Dubois, MD  lidocaine (LIDODERM) 5 % Place 1 patch onto the skin daily. Remove & Discard patch within 12 hours or as directed by MD 10/09/16   Verlee Monte, MD  methocarbamol (ROBAXIN) 500 MG tablet Take 0.5 tablets (250 mg total) by mouth every 6 (six) hours as needed for muscle spasms. 07/24/16   Angiulli, Lavon Paganini, PA-C  metoCLOPramide (REGLAN) 5 MG tablet Take 1 tablet (5 mg total) by mouth 2 (two) times daily before a meal. Decrease to once a day before breakfast after a couple  of weeks. Patient taking differently: Take 5 mg by mouth daily as needed for nausea or vomiting.  07/24/16   Angiulli, Lavon Paganini, PA-C  metoprolol tartrate (LOPRESSOR) 25 MG tablet Take 0.5 tablets (12.5  mg total) by mouth 2 (two) times daily. 10/09/16   Verlee Monte, MD  midodrine (PROAMATINE) 10 MG tablet Take 1 tablet (10 mg total) by mouth 3 (three) times daily with meals. 10/02/16   Barton Dubois, MD  mirtazapine (REMERON) 15 MG tablet Take 15 mg by mouth at bedtime.  08/17/16   [provider]  multivitamin (RENA-VIT) TABS tablet Take 1 tablet by mouth at bedtime. 05/20/16   Love, Ivan Anchors, PA-C  nitroGLYCERIN (NITROSTAT) 0.4 MG SL tablet Place 1 tablet (0.4 mg total) under the tongue every 5 (five) minutes as needed for chest pain. 03/20/14   Elgergawy, Silver Huguenin, MD  ondansetron (ZOFRAN ODT) 8 MG disintegrating tablet 8mg  ODT q4 hours prn nausea Patient taking differently: Take 8 mg by mouth every 4 (four) hours as needed for nausea or vomiting.  09/18/16   Veryl Speak, MD  oxyCODONE (OXY IR/ROXICODONE) 5 MG immediate release tablet Take 1 tablet (5 mg total) by mouth every 4 (four) hours as needed for severe pain. 10/09/16   Verlee Monte, MD  promethazine (PHENERGAN) 12.5 MG tablet Take 12.5 mg by mouth every 6 (six) hours as needed for nausea or vomiting.    [provider]  ranolazine (RANEXA) 500 MG 12 hr tablet Take 1 tablet (500 mg total) by mouth daily. 10/09/16   Verlee Monte, MD  tacrolimus (PROGRAF) 1 MG capsule Take 2 mg by mouth 2 (two) times daily.     [provider]  traMADol (ULTRAM) 50 MG tablet Take 50 mg by mouth 3 (three) times daily as needed for moderate pain.    [provider]  traZODone (DESYREL) 50 MG tablet Take 50 mg by mouth at bedtime.    [provider]  warfarin (COUMADIN) 2 MG tablet Take 1-2 tablets by mouth daily as directed by coumadin clinic 10/03/16   Barton Dubois, MD    Family History Family History  Problem  Relation Age of Onset  . Cancer Mother   . Diabetes Mother   . Hypertension Mother   . Stroke Mother   . Cancer Father   . Anesthesia problems Neg Hx   . Hypotension Neg Hx   . Malignant hyperthermia Neg Hx   . Pseudochol deficiency Neg Hx     Social History Social History  Substance Use Topics  . Smoking status: Current Every Day Smoker    Packs/day: 0.25    Years: 43.00    Types: Cigarettes  . Smokeless tobacco: Never Used     Comment: 8/18 - down to <0.5 ppd  . Alcohol use No     Allergies   Acetaminophen; Morphine and related; Codeine; and Mirtazapine   Review of Systems Review of Systems Level V caveat cannot obtain review systems due to patient condition.  Physical Exam Updated Vital Signs BP 104/61   Pulse 83   Temp 98.5 F (36.9 C) (Oral)   Resp (!) 0   Ht 5\' 4"  (1.626 m)   Wt 46.3 kg (102 lb)   SpO2 98%   BMI 17.51 kg/m   Physical Exam  Constitutional:  Patient is very quiet and poorly communicative. She is lying on her left side with eyes slightly closed. Mild tachypnea.  HENT:  Head: Normocephalic and atraumatic.  Mouth/Throat: Oropharynx is clear and moist.  Eyes: EOM are normal.  Cardiovascular:  Borderline tachycardia. 2\6 systolic ejection murmur.  Pulmonary/Chest:  Mild increased work of breathing. Crackles bilateral bases.  Abdominal: Soft. She exhibits no distension. There is  no tenderness.  Musculoskeletal:  Patient has bilateral below the knee amputations wearing compression socks.  Neurological:  Patient is fairly somnolent and withdrawn. She answers simple questions. No focal neuro deficit.  Skin: Skin is warm and dry.  Psychiatric:  Affect very flat.     ED Treatments / Results  Labs (all labs ordered are listed, but only abnormal results are displayed) Labs Reviewed  CBC - Abnormal; Notable for the following:       Result Value   WBC 11.4 (*)    RBC 3.76 (*)    Hemoglobin 11.0 (*)    HCT 33.9 (*)    RDW 19.7 (*)     All other components within normal limits  BASIC METABOLIC PANEL - Abnormal; Notable for the following:    Sodium 133 (*)    Potassium 5.8 (*)    Chloride 99 (*)    Glucose, Bld 151 (*)    Creatinine, Ser 2.97 (*)    Calcium 8.4 (*)    GFR calc non Af Amer 16 (*)    GFR calc Af Amer 18 (*)    All other components within normal limits  HEPATIC FUNCTION PANEL - Abnormal; Notable for the following:    Albumin 1.9 (*)    AST 46 (*)    Alkaline Phosphatase 167 (*)    Total Bilirubin 1.8 (*)    Bilirubin, Direct 0.9 (*)    All other components within normal limits  PROTIME-INR - Abnormal; Notable for the following:    Prothrombin Time 36.3 (*)    All other components within normal limits  AMMONIA - Abnormal; Notable for the following:    Ammonia 42 (*)    All other components within normal limits  I-STAT TROPONIN, ED - Abnormal; Notable for the following:    Troponin i, poc 0.54 (*)    All other components within normal limits  I-STAT ARTERIAL BLOOD GAS, ED - Abnormal; Notable for the following:    pH, Arterial 7.467 (*)    pO2, Arterial 68.0 (*)    Bicarbonate 29.7 (*)    Acid-Base Excess 5.0 (*)    All other components within normal limits  BLOOD GAS, ARTERIAL    EKG  EKG Interpretation  Date/Time:  Wednesday October 29 2016 18:53:44 EDT Ventricular Rate:  96 PR Interval:    QRS Duration: 125 QT Interval:  397 QTC Calculation: 502 R Axis:   -55 Text Interpretation:  Sinus rhythm Nonspecific IVCD with LAD LVH with secondary repolarization abnormality Probable anterior infarct, age indeterminate agree. similar to old but anterior repolarization abnormality more pronounced Confirmed by Charlesetta Shanks [phone]) on 10/29/2016 7:06:17 PM       Radiology Dg Chest 2 View  Result Date: 10/29/2016 CLINICAL DATA:  Chest pain and shortness of breath EXAM: CHEST  2 VIEW COMPARISON:  10/06/2016 FINDINGS: Post sternotomy changes and postsurgical changes in the upper abdomen.  Right-sided central venous catheter tip overlies the proximal to mid right atrium. There are small bilateral effusions. There is mild cardiomegaly with central vascular congestion and mild interstitial edema. IMPRESSION: 1. Cardiomegaly with central vascular congestion and increased interstitial edema. Slight increased small bilateral effusions. Electronically Signed   By: Donavan Foil M.D.   On: 10/29/2016 20:23    Procedures Procedures (including critical care time)  Medications Ordered in ED Medications - No data to display   Initial Impression / Assessment and Plan / ED Course  I have reviewed the triage vital signs and the nursing  notes.  Pertinent labs & imaging results that were available during my care of the patient were reviewed by me and considered in my medical decision making (see chart for details).     Consult: Dr. Hal Hope  Final Clinical Impressions(s) / ED Diagnoses   Final diagnoses:  Nonspecific chest pain  Coronary artery disease involving native heart with angina pectoris, unspecified vessel or lesion type ([place])  ESRD (end stage renal disease) ([place])  Severe comorbid illness  Patient has multiple and severe medical comorbidities. She present today with chest pain and shortness of breath. Patient has a consultation palliative care at prior admission. Patient is high risk for sudden death as outlined in prior discharge summary. Todays presentation may reflect declining cardiac function. Plan will be for observation in the hospital.   New Prescriptions New Prescriptions   No medications on file     Charlesetta Shanks, MD 10/29/16 2239

## 2016-10-29 NOTE — ED Triage Notes (Signed)
Pt presents with ems from home for chest pain and shortness of breath. Pt is a dialysis patient and went today. She had some chicken parmigiana for lunch and got nauseous and she threw up one time and then started having the chest pain. Pain is centralized and sharp in nature. Received 1 nitro sl with ems in route.

## 2016-10-29 NOTE — Patient Outreach (Signed)
Transition of care:  Placed call to patient for weekly transition of care. No answer.  PLAN: Will continue to outreach patient.  [name], RN, BSN, CEN [facility] [employer] [phone]

## 2016-10-30 ENCOUNTER — Telehealth: Payer: Self-pay | Admitting: *Deleted

## 2016-10-30 DIAGNOSIS — E1151 Type 2 diabetes mellitus with diabetic peripheral angiopathy without gangrene: Secondary | ICD-10-CM | POA: Diagnosis not present

## 2016-10-30 DIAGNOSIS — N2581 Secondary hyperparathyroidism of renal origin: Secondary | ICD-10-CM | POA: Diagnosis not present

## 2016-10-30 DIAGNOSIS — Z992 Dependence on renal dialysis: Secondary | ICD-10-CM | POA: Diagnosis not present

## 2016-10-30 DIAGNOSIS — I132 Hypertensive heart and chronic kidney disease with heart failure and with stage 5 chronic kidney disease, or end stage renal disease: Secondary | ICD-10-CM | POA: Diagnosis not present

## 2016-10-30 DIAGNOSIS — Z66 Do not resuscitate: Secondary | ICD-10-CM | POA: Diagnosis not present

## 2016-10-30 DIAGNOSIS — D631 Anemia in chronic kidney disease: Secondary | ICD-10-CM | POA: Diagnosis not present

## 2016-10-30 DIAGNOSIS — N186 End stage renal disease: Secondary | ICD-10-CM

## 2016-10-30 DIAGNOSIS — Z944 Liver transplant status: Secondary | ICD-10-CM | POA: Diagnosis not present

## 2016-10-30 DIAGNOSIS — Z952 Presence of prosthetic heart valve: Secondary | ICD-10-CM

## 2016-10-30 DIAGNOSIS — R079 Chest pain, unspecified: Secondary | ICD-10-CM

## 2016-10-30 DIAGNOSIS — R0789 Other chest pain: Secondary | ICD-10-CM | POA: Diagnosis not present

## 2016-10-30 DIAGNOSIS — I959 Hypotension, unspecified: Secondary | ICD-10-CM | POA: Diagnosis not present

## 2016-10-30 DIAGNOSIS — G9341 Metabolic encephalopathy: Secondary | ICD-10-CM | POA: Diagnosis not present

## 2016-10-30 DIAGNOSIS — Z681 Body mass index (BMI) 19 or less, adult: Secondary | ICD-10-CM | POA: Diagnosis not present

## 2016-10-30 DIAGNOSIS — E039 Hypothyroidism, unspecified: Secondary | ICD-10-CM | POA: Diagnosis not present

## 2016-10-30 DIAGNOSIS — I5042 Chronic combined systolic (congestive) and diastolic (congestive) heart failure: Secondary | ICD-10-CM | POA: Diagnosis not present

## 2016-10-30 DIAGNOSIS — E43 Unspecified severe protein-calorie malnutrition: Secondary | ICD-10-CM | POA: Diagnosis not present

## 2016-10-30 DIAGNOSIS — E1122 Type 2 diabetes mellitus with diabetic chronic kidney disease: Secondary | ICD-10-CM | POA: Diagnosis not present

## 2016-10-30 LAB — MRSA PCR SCREENING: MRSA by PCR: NEGATIVE

## 2016-10-30 LAB — BASIC METABOLIC PANEL
ANION GAP: 10 (ref 5–15)
BUN: 9 mg/dL (ref 6–20)
CALCIUM: 8.6 mg/dL — AB (ref 8.9–10.3)
CO2: 25 mmol/L (ref 22–32)
Chloride: 100 mmol/L — ABNORMAL LOW (ref 101–111)
Creatinine, Ser: 3.54 mg/dL — ABNORMAL HIGH (ref 0.44–1.00)
GFR calc Af Amer: 15 mL/min — ABNORMAL LOW (ref >60)
GFR calc non Af Amer: 13 mL/min — ABNORMAL LOW (ref >60)
GLUCOSE: 142 mg/dL — AB (ref 65–99)
POTASSIUM: 4.5 mmol/L (ref 3.5–5.1)
Sodium: 135 mmol/L (ref 135–145)

## 2016-10-30 LAB — PROTIME-INR
INR: 3.74
Prothrombin Time: 36.7 seconds — ABNORMAL HIGH (ref 11.4–15.2)

## 2016-10-30 LAB — CBC
HEMATOCRIT: 32.2 % — AB (ref 36.0–46.0)
HEMOGLOBIN: 10.5 g/dL — AB (ref 12.0–15.0)
MCH: 29.9 pg (ref 26.0–34.0)
MCHC: 32.6 g/dL (ref 30.0–36.0)
MCV: 91.7 fL (ref 78.0–100.0)
Platelets: 149 10*3/uL — ABNORMAL LOW (ref 150–400)
RBC: 3.51 MIL/uL — ABNORMAL LOW (ref 3.87–5.11)
RDW: 19.5 % — ABNORMAL HIGH (ref 11.5–15.5)
WBC: 11.7 10*3/uL — ABNORMAL HIGH (ref 4.0–10.5)

## 2016-10-30 LAB — TROPONIN I
TROPONIN I: 0.61 ng/mL — AB (ref <0.03)
Troponin I: 0.61 ng/mL (ref <0.03)
Troponin I: 0.67 ng/mL (ref <0.03)

## 2016-10-30 MED ORDER — MIDODRINE HCL 5 MG PO TABS
10.0000 mg | ORAL_TABLET | Freq: Three times a day (TID) | ORAL | Status: DC
  Administered 2016-10-30 – 2016-11-01 (×6): 10 mg via ORAL
  Filled 2016-10-30 (×10): qty 2

## 2016-10-30 MED ORDER — OXYCODONE HCL 5 MG PO TABS: 5.0000 mg | ORAL_TABLET | ORAL | Status: DC | PRN

## 2016-10-30 MED ORDER — ALPRAZOLAM 0.5 MG PO TABS
0.5000 mg | ORAL_TABLET | Freq: Three times a day (TID) | ORAL | Status: DC | PRN
  Administered 2016-10-30 – 2016-10-31 (×2): 0.5 mg via ORAL
  Filled 2016-10-30 (×3): qty 1

## 2016-10-30 MED ORDER — TACROLIMUS 1 MG PO CAPS
2.0000 mg | ORAL_CAPSULE | Freq: Two times a day (BID) | ORAL | Status: DC
  Administered 2016-10-30 – 2016-11-01 (×6): 2 mg via ORAL
  Filled 2016-10-30 (×7): qty 2

## 2016-10-30 MED ORDER — ASPIRIN EC 81 MG PO TBEC
81.0000 mg | DELAYED_RELEASE_TABLET | Freq: Every day | ORAL | Status: DC
  Administered 2016-10-30 – 2016-11-01 (×3): 81 mg via ORAL
  Filled 2016-10-30 (×3): qty 1

## 2016-10-30 MED ORDER — METHOCARBAMOL 500 MG PO TABS: 250.0000 mg | ORAL_TABLET | Freq: Four times a day (QID) | ORAL | Status: DC | PRN

## 2016-10-30 MED ORDER — ACETAMINOPHEN 325 MG PO TABS: 650.0000 mg | ORAL_TABLET | Freq: Four times a day (QID) | ORAL | Status: DC | PRN

## 2016-10-30 MED ORDER — METOCLOPRAMIDE HCL 5 MG PO TABS: 5.0000 mg | ORAL_TABLET | Freq: Every day | ORAL | Status: DC | PRN

## 2016-10-30 MED ORDER — CALCITRIOL 0.5 MCG PO CAPS: 0.5000 ug | ORAL_CAPSULE | ORAL | Status: DC

## 2016-10-30 MED ORDER — CALCIUM CARBONATE ANTACID 500 MG PO CHEW
1.0000 | CHEWABLE_TABLET | Freq: Every day | ORAL | Status: DC
  Administered 2016-10-30 – 2016-10-31 (×2): 200 mg via ORAL
  Filled 2016-10-30 (×3): qty 1

## 2016-10-30 MED ORDER — MIRTAZAPINE 7.5 MG PO TABS
15.0000 mg | ORAL_TABLET | Freq: Every day | ORAL | Status: DC
  Administered 2016-10-30 – 2016-10-31 (×3): 15 mg via ORAL
  Filled 2016-10-30: qty 1
  Filled 2016-10-30 (×2): qty 2
  Filled 2016-10-30: qty 1

## 2016-10-30 MED ORDER — LEVOTHYROXINE SODIUM 100 MCG PO TABS
200.0000 ug | ORAL_TABLET | Freq: Every day | ORAL | Status: DC
  Administered 2016-10-30 – 2016-11-01 (×3): 200 ug via ORAL
  Filled 2016-10-30 (×4): qty 2

## 2016-10-30 MED ORDER — GABAPENTIN 300 MG PO CAPS
300.0000 mg | ORAL_CAPSULE | Freq: Every day | ORAL | Status: DC
  Administered 2016-10-30 – 2016-10-31 (×2): 300 mg via ORAL
  Filled 2016-10-30 (×2): qty 1

## 2016-10-30 MED ORDER — RENA-VITE PO TABS
1.0000 | ORAL_TABLET | Freq: Every day | ORAL | Status: DC
  Administered 2016-10-30 – 2016-10-31 (×3): 1 via ORAL
  Filled 2016-10-30 (×4): qty 1

## 2016-10-30 MED ORDER — METOPROLOL TARTRATE 12.5 MG HALF TABLET
12.5000 mg | ORAL_TABLET | Freq: Two times a day (BID) | ORAL | Status: DC
  Administered 2016-10-31 (×2): 12.5 mg via ORAL
  Filled 2016-10-30 (×4): qty 1

## 2016-10-30 MED ORDER — PROMETHAZINE HCL 25 MG PO TABS: 12.5000 mg | ORAL_TABLET | Freq: Four times a day (QID) | ORAL | Status: DC | PRN

## 2016-10-30 MED ORDER — NITROGLYCERIN 0.4 MG SL SUBL: 0.4000 mg | SUBLINGUAL_TABLET | SUBLINGUAL | Status: DC | PRN

## 2016-10-30 MED ORDER — ACETAMINOPHEN 650 MG RE SUPP: 650.0000 mg | Freq: Four times a day (QID) | RECTAL | Status: DC | PRN

## 2016-10-30 MED ORDER — BOOST / RESOURCE BREEZE PO LIQD
1.0000 | Freq: Three times a day (TID) | ORAL | Status: DC
  Administered 2016-10-30: 1 via ORAL
  Filled 2016-10-30: qty 1

## 2016-10-30 MED ORDER — ONDANSETRON HCL 4 MG/2ML IJ SOLN
4.0000 mg | Freq: Four times a day (QID) | INTRAMUSCULAR | Status: DC | PRN
  Administered 2016-10-31: 4 mg via INTRAVENOUS
  Filled 2016-10-30: qty 2

## 2016-10-30 MED ORDER — INFLUENZA VAC SPLIT QUAD 0.5 ML IM SUSY: 0.5000 mL | PREFILLED_SYRINGE | INTRAMUSCULAR | Status: DC

## 2016-10-30 MED ORDER — CALCIUM ACETATE (PHOS BINDER) 667 MG PO CAPS
667.0000 mg | ORAL_CAPSULE | Freq: Three times a day (TID) | ORAL | Status: DC
  Administered 2016-10-31 – 2016-11-01 (×4): 667 mg via ORAL
  Filled 2016-10-30 (×9): qty 1

## 2016-10-30 MED ORDER — LIDOCAINE 5 % EX PTCH
1.0000 | MEDICATED_PATCH | CUTANEOUS | Status: DC
  Administered 2016-10-31 – 2016-11-01 (×2): 1 via TRANSDERMAL
  Filled 2016-10-30 (×2): qty 1

## 2016-10-30 MED ORDER — PRO-STAT SUGAR FREE PO LIQD
30.0000 mL | Freq: Two times a day (BID) | ORAL | Status: DC
  Administered 2016-10-31: 30 mL via ORAL
  Filled 2016-10-30 (×4): qty 30

## 2016-10-30 MED ORDER — TRAMADOL HCL 50 MG PO TABS: 50.0000 mg | ORAL_TABLET | Freq: Three times a day (TID) | ORAL | Status: DC | PRN

## 2016-10-30 MED ORDER — ONDANSETRON HCL 4 MG PO TABS: 4.0000 mg | ORAL_TABLET | Freq: Four times a day (QID) | ORAL | Status: DC | PRN

## 2016-10-30 MED ORDER — RANOLAZINE ER 500 MG PO TB12
500.0000 mg | ORAL_TABLET | Freq: Every day | ORAL | Status: DC
  Administered 2016-10-30: 500 mg via ORAL
  Filled 2016-10-30 (×4): qty 1

## 2016-10-30 MED ORDER — TRAZODONE HCL 50 MG PO TABS
50.0000 mg | ORAL_TABLET | Freq: Every day | ORAL | Status: DC
  Administered 2016-10-30 – 2016-10-31 (×2): 50 mg via ORAL
  Filled 2016-10-30 (×2): qty 1

## 2016-10-30 NOTE — H&P (Addendum)
History and Physical    [name] VPX:[id number] DOB: Nov 05, 1955 DOA: 10/29/2016  PCP: [name] Cuff, MD  Patient coming from: Home.  Chief Complaint: Chest pain.  HPI: [name] is a 61 y.o. female with history of ESRD on hemodialysis on Monday Wednesday and Friday who was recently admitted and discharged 2 weeks ago after being treated for non-ST elevation MI conservatively patient also had a cardiac cath in July 3 months ago during which diffuse disease was found presents to the ER with complaints of chest pain. Patient started developing chest pain after dialysis last evening. Patient was having supper when patient started throwing up once and following which patient had persistent chest pain radiating to the back pressure-like.   ED Course: In the ER patient had persistent chest pain troponin point-of-care was mildly elevated. EKG was showing nonspecific findings. By time I examined the patient patient's chest pain is resolved. Patient is being admitted for further management of chest pain. Patient otherwise is not in acute distress. Chest x-ray shows congestion.  Review of Systems: As per HPI, rest all negative.   Past Medical History:  Diagnosis Date  . Anemia    takes Folic Acid daily  . Anxiety   . Arthritis    "left hand, back" (08/30/2012)  . Asthma   . CAD (coronary artery disease) Jan. 2015   Cath: 20% LAD, 50% D1; s/p LIMA-LAD  . Chest pain 03/06/2015  . CHF (congestive heart failure) ([place]) 05/2016  . Chronic back pain   . Chronic constipation    takes MIralax and Colace daily  . COPD (chronic obstructive pulmonary disease) ([place])   . Depression    takes Cymbalta for "severe" depression  . ESRD on dialysis [facility])    "MWF; [facility]" (08/12/2016)  . GERD (gastroesophageal reflux disease)    takes Omeprazole daily  . Headache    "at least monthly" (08/30/2012)  . Hepatitis C   . History of blood transfusion    "several" (08/30/2012)  . Hypertension    takes Metoprolol and Lisinopril daily, sees Dr Bea Graff  . Hypothyroidism    takes Synthroid daily  . Migraine    "last migraine was in 2013" (08/30/2012)  . Myocardial infarction ([place])   . Neuromuscular disorder ([place])    carpal tunnel in right hand  . Obesity   . Peripheral vascular disease (HCC) hands and legs  . Pneumonia    "today and several times before" (08/30/2012)  . 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 S/P bilateral BKA (below knee amputation) ([place]) 04/30/2016  . 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 SVT (supraventricular tachycardia) ([place]) 06/09/14  . Tobacco abuse   . Type II diabetes mellitus (HCC)    Levemir 2units daily if > 150    Past Surgical History:  Procedure Laterality Date  . ABDOMINAL AORTOGRAM W/LOWER EXTREMITY N/A 07/04/2016   Procedure: Abdominal Aortogram w/Lower Extremity;  Surgeon: Elam Dutch, MD;  Location: [place] CV LAB;  Service: Cardiovascular;  Laterality: N/A;  . AMPUTATION Left 04/24/2016   Procedure: AMPUTATION BELOW KNEE;  Surgeon: Angelia Mould, MD;  Location: [place];  Service: Vascular;  Laterality: Left;  . AMPUTATION Right 07/08/2016   Procedure: AMPUTATION BELOW KNEE;  Surgeon: Angelia Mould, MD;  Location: [place];  Service: Vascular;  Laterality: Right;  . AORTIC VALVE  REPLACEMENT N/A 03/15/2013   AVR; Surgeon: Ivin Poot, MD;  Location: [facility] OR; Open Heart Surgery;  3mm[employer] mechanical prosthesis, top hat valve  . ARTERIOVENOUS GRAFT PLACEMENT Left 10/03/10    forearm  . AV FISTULA PLACEMENT  01/29/2011   Procedure: INSERTION OF ARTERIOVENOUS (AV) GORE-TEX GRAFT ARM;  Surgeon: Elam Dutch, MD;  Location: [facility] OR;  Service: Vascular;  Laterality: Right;  . AV FISTULA PLACEMENT  03/10/2011   Procedure: INSERTION OF ARTERIOVENOUS (AV) GORE-TEX GRAFT THIGH;  Surgeon: Elam Dutch, MD;  Location: [place];   Service: Vascular;  Laterality: Left;  . AV FISTULA PLACEMENT Left 07/21/2016   Procedure: CREATION OF LEFT UPPER ARM ARTERIOVENOUS (AV) FISTULA;  Surgeon: Conrad Loog[place], MD;  Location: [place];  Service: Vascular;  Laterality: Left;  . [place] REMOVAL  12/23/2010   Procedure: REMOVAL OF ARTERIOVENOUS GORETEX GRAFT ([place]);  Surgeon: Elam Dutch, MD;  Location: [place];  Service: Vascular;  Laterality: Left;  procedure started @1736 -1852  . CHOLECYSTECTOMY  1993  . CORONARY ANGIOGRAPHY N/A 08/12/2016   Procedure: CORONARY ANGIOGRAPHY (CATH LAB);  Surgeon: Leonie Man, MD;  Location: [place] CV LAB;  Service: Cardiovascular;  Laterality: N/A;  . CORONARY ARTERY BYPASS GRAFT N/A 03/15/2013   Procedure: CORONARY ARTERY BYPASS GRAFTING (CABG) times one using left internal mammary artery.;  Surgeon: Ivin Poot, MD;  Location: [place];  Service: Open Heart Surgery;  Laterality: N/A;  POSS CABG X 1  . CYSTOSCOPY  1990's  . INSERTION OF DIALYSIS CATHETER  12/23/2010   Procedure: INSERTION OF DIALYSIS CATHETER;  Surgeon: Elam Dutch, MD;  Location: [place];  Service: Vascular;  Laterality: Right;  Right Internal Jugular 28cm dialysis catheter insertion procedure time 1701-1720   . INSERTION OF DIALYSIS CATHETER Right 03/11/2016   Procedure: INSERTION OF DIALYSIS CATHETER;  Surgeon: Angelia Mould, MD;  Location: [place];  Service: Vascular;  Laterality: Right;  . INTRAOPERATIVE TRANSESOPHAGEAL ECHOCARDIOGRAM N/A 03/15/2013   Procedure: INTRAOPERATIVE TRANSESOPHAGEAL ECHOCARDIOGRAM;  Surgeon: Ivin Poot, MD;  Location: [place];  Service: Open Heart Surgery;  Laterality: N/A;  . LEFT HEART CATHETERIZATION WITH CORONARY ANGIOGRAM N/A 07/29/2012   Procedure: LEFT HEART CATHETERIZATION WITH CORONARY ANGIOGRAM;  Surgeon: Troy Sine, MD;  Location: [facility] CATH LAB;  Service: Cardiovascular;  Laterality: N/A;  . LEFT HEART CATHETERIZATION WITH CORONARY ANGIOGRAM N/A 03/10/2013   Procedure: LEFT HEART  CATHETERIZATION WITH CORONARY ANGIOGRAM;  Surgeon: Troy Sine, MD;  Location: [facility] CATH LAB;  Service: Cardiovascular;  Laterality: N/A;  . LEFT HEART CATHETERIZATION WITH CORONARY/GRAFT ANGIOGRAM N/A 12/24/2011   Procedure: LEFT HEART CATHETERIZATION WITH Beatrix Fetters;  Surgeon: [name], MD;  Location: [facility] CATH LAB;  Service: Cardiovascular;  Laterality: N/A;  . LEFT HEART CATHETERIZATION WITH CORONARY/GRAFT ANGIOGRAM N/A 12/16/2013   Procedure: LEFT HEART CATHETERIZATION WITH Beatrix Fetters;  Surgeon: Troy Sine, MD;  Location: [facility] CATH LAB;  Service: Cardiovascular;  Laterality: N/A;  . left kidney removed    . LIGATION ARTERIOVENOUS GORTEX GRAFT Left 03/11/2016   Procedure: LIGATION THIGH ARTERIOVENOUS GORTEX GRAFT;  Surgeon: Angelia Mould, MD;  Location: [place];  Service: Vascular;  Laterality: Left;  . LIGATION OF ARTERIOVENOUS  FISTULA Left 08/11/2016   Procedure: LIGATION OF ARTERIOVENOUS  FISTULA;  Surgeon: Elam Dutch, MD;  Location: [place];  Service: Vascular;  Laterality: Left;  . LIVER TRANSPLANT  10/25/2009   sees Dr Ferol Luz 1 every 6 months, saw last in Dec  2013[name] Coord [phone]  . PERIPHERAL VASCULAR CATHETERIZATION N/A 11/06/2015   Procedure: Abdominal Aortogram;  Surgeon: Serafina Mitchell, MD;  Location: [place] CV LAB;  Service: Cardiovascular;  Laterality: N/A;  . PERIPHERAL VASCULAR CATHETERIZATION N/A 11/06/2015   Procedure: Lower Extremity Angiography;  Surgeon: Serafina Mitchell, MD;  Location: [place] CV LAB;  Service: Cardiovascular;  Laterality: N/A;  . PERIPHERAL VASCULAR CATHETERIZATION  11/06/2015   Procedure: Peripheral Vascular Atherectomy;  Surgeon: Serafina Mitchell, MD;  Location: [place] CV LAB;  Service: Cardiovascular;;  Left Superficial femoral  . SHUNTOGRAM Left 05/15/2014   Procedure: SHUNTOGRAM;  Surgeon: Conrad [place], MD;  Location: [facility] CATH LAB;  Service: Cardiovascular;  Laterality: Left;  .  SMALL INTESTINE SURGERY  90's  . SPINAL GROWTH RODS  2010   "put 2 metal rods in my back; they had detetriorated" (08/30/2012)  . THROMBECTOMY    . THROMBECTOMY AND REVISION OF ARTERIOVENTOUS (AV) GORETEX  GRAFT Left 03/30/2014  . THROMBECTOMY AND REVISION OF ARTERIOVENTOUS (AV) GORETEX  GRAFT Left 03/30/2014   Procedure: THROMBECTOMY AND REVISION OF ARTERIOVENTOUS (AV) GORETEX  GRAFT;  Surgeon: Conrad Voltaire, MD;  Location: [place];  Service: Vascular;  Laterality: Left;  . TUBAL LIGATION  1990's     reports that she has been smoking Cigarettes.  She has a 10.75 pack-year smoking history. She has never used smokeless tobacco. She reports that she does not drink alcohol or use drugs.  Allergies  Allergen Reactions  . Acetaminophen Other (See Comments)    Liver transplant recipient   . Morphine And Related Itching  . Codeine Itching  . Mirtazapine Other (See Comments)    hallucination    Family History  Problem Relation Age of Onset  . Cancer Mother   . Diabetes Mother   . Hypertension Mother   . Stroke Mother   . Cancer Father   . Anesthesia problems Neg Hx   . Hypotension Neg Hx   . Malignant hyperthermia Neg Hx   . Pseudochol deficiency Neg Hx     Prior to Admission medications   Medication Sig Start Date End Date Taking? Authorizing Provider  ALPRAZolam Duanne Moron) 0.5 MG tablet Take 0.5 mg by mouth 3 (three) times daily as needed for anxiety.  08/26/16   [provider]  Amino Acids-Protein Hydrolys (FEEDING SUPPLEMENT, PRO-STAT SUGAR FREE 64,) LIQD Take 30 mLs by mouth 2 (two) times daily. 08/28/16   Lavina Hamman, MD  aspirin EC 81 MG tablet Take 81 mg by mouth daily at 12 noon.     [provider]  calcitRIOL (ROCALTROL) 0.5 MCG capsule Take 1 capsule (0.5 mcg total) by mouth every Monday, Wednesday, and Friday with hemodialysis. 07/25/16   Angiulli, Lavon Paganini, PA-C  calcium acetate (PHOSLO) 667 MG capsule Take 1 capsule (667 mg total) by mouth 3 (three) times  daily with meals. 07/24/16   Angiulli, Lavon Paganini, PA-C  calcium carbonate (TUMS - DOSED IN MG ELEMENTAL CALCIUM) 500 MG chewable tablet Chew 1 tablet by mouth daily.    [provider]  clindamycin (CLEOCIN) 300 MG capsule Take 300 mg by mouth 2 (two) times daily.    [provider]  feeding supplement (BOOST / RESOURCE BREEZE) LIQD Take 1 Container by mouth 3 (three) times daily between meals. 04/25/16   Reyne Dumas, MD  gabapentin (NEURONTIN) 300 MG capsule Take 300 mg by mouth at bedtime. Daughter reports patient taking 100 mg on hemodialysis days  [provider]  levothyroxine (SYNTHROID, LEVOTHROID) 200 MCG tablet Take 1 tablet (200 mcg total) by mouth daily before breakfast. 10/03/16   Barton Dubois, MD  lidocaine (LIDODERM) 5 % Place 1 patch onto the skin daily. Remove & Discard patch within 12 hours or as directed by MD 10/09/16   Verlee Monte, MD  methocarbamol (ROBAXIN) 500 MG tablet Take 0.5 tablets (250 mg total) by mouth every 6 (six) hours as needed for muscle spasms. 07/24/16   Angiulli, Lavon Paganini, PA-C  metoCLOPramide (REGLAN) 5 MG tablet Take 1 tablet (5 mg total) by mouth 2 (two) times daily before a meal. Decrease to once a day before breakfast after a couple of weeks. Patient taking differently: Take 5 mg by mouth daily as needed for nausea or vomiting.  07/24/16   Angiulli, Lavon Paganini, PA-C  metoprolol tartrate (LOPRESSOR) 25 MG tablet Take 0.5 tablets (12.5 mg total) by mouth 2 (two) times daily. 10/09/16   Verlee Monte, MD  midodrine (PROAMATINE) 10 MG tablet Take 1 tablet (10 mg total) by mouth 3 (three) times daily with meals. 10/02/16   Barton Dubois, MD  mirtazapine (REMERON) 15 MG tablet Take 15 mg by mouth at bedtime.  08/17/16   [provider]  multivitamin (RENA-VIT) TABS tablet Take 1 tablet by mouth at bedtime. 05/20/16   Love, Ivan Anchors, PA-C  nitroGLYCERIN (NITROSTAT) 0.4 MG SL tablet Place 1 tablet (0.4 mg total) under the tongue every 5  (five) minutes as needed for chest pain. 03/20/14   Elgergawy, Silver Huguenin, MD  ondansetron (ZOFRAN ODT) 8 MG disintegrating tablet 8mg  ODT q4 hours prn nausea Patient taking differently: Take 8 mg by mouth every 4 (four) hours as needed for nausea or vomiting.  09/18/16   Veryl Speak, MD  oxyCODONE (OXY IR/ROXICODONE) 5 MG immediate release tablet Take 1 tablet (5 mg total) by mouth every 4 (four) hours as needed for severe pain. 10/09/16   Verlee Monte, MD  promethazine (PHENERGAN) 12.5 MG tablet Take 12.5 mg by mouth every 6 (six) hours as needed for nausea or vomiting.    [provider]  ranolazine (RANEXA) 500 MG 12 hr tablet Take 1 tablet (500 mg total) by mouth daily. 10/09/16   Verlee Monte, MD  tacrolimus (PROGRAF) 1 MG capsule Take 2 mg by mouth 2 (two) times daily.     [provider]  traMADol (ULTRAM) 50 MG tablet Take 50 mg by mouth 3 (three) times daily as needed for moderate pain.    [provider]  traZODone (DESYREL) 50 MG tablet Take 50 mg by mouth at bedtime.    [provider]  warfarin (COUMADIN) 2 MG tablet Take 1-2 tablets by mouth daily as directed by coumadin clinic 10/03/16   Barton Dubois, MD    Physical Exam: Vitals:   10/29/16 2315 10/29/16 2330 10/29/16 2345 10/30/16 0000  BP: 111/60 (!) 115/55 117/73 115/78  Pulse:      Resp: 18 16 (!) 30 (!) 23  Temp:      TempSrc:      SpO2: 98% 98%    Weight:      Height:          Constitutional: Moderately built and nourished. Vitals:   10/29/16 2315 10/29/16 2330 10/29/16 2345 10/30/16 0000  BP: 111/60 (!) 115/55 117/73 115/78  Pulse:      Resp: 18 16 (!) 30 (!) 23  Temp:      TempSrc:      SpO2:  98% 98%    Weight:      Height:       Eyes: Anicteric no pallor. ENMT: No discharge from the ears eyes nose and mouth. Neck: No mass felt. No JVD appreciated. Respiratory: No rhonchi or crepitations. Cardiovascular: S1-S2 heard. No murmurs appreciated. Abdomen: Soft nontender  bowel sounds present. No guarding or rigidity. Musculoskeletal: No edema. No joint effusion. Skin: No rash. Skin appears warm. Neurologic: Alert awake oriented to time place and person. Moves all extremities. Psychiatric: Appears normal. Normal affect.   Labs on Admission: I have personally reviewed following labs and imaging studies  CBC:  Recent Labs Lab 10/29/16 1900  WBC 11.4*  HGB 11.0*  HCT 33.9*  MCV 90.2  PLT 643   Basic Metabolic Panel:  Recent Labs Lab 10/29/16 1953  NA 133*  K 5.8*  CL 99*  CO2 26  GLUCOSE 151*  BUN 7  CREATININE 2.97*  CALCIUM 8.4*   GFR: Estimated Creatinine Clearance: 14.4 mL/min (A) (by C-G formula based on SCr of 2.97 mg/dL (H)). Liver Function Tests:  Recent Labs Lab 10/29/16 2115  AST 46*  ALT 19  ALKPHOS 167*  BILITOT 1.8*  PROT 6.8  ALBUMIN 1.9*   No results for input(s): LIPASE, AMYLASE in the last 168 hours.  Recent Labs Lab 10/29/16 2115  AMMONIA 42*   Coagulation Profile:  Recent Labs Lab 10/27/16 10/29/16 2115  INR 6.0* 3.69   Cardiac Enzymes: No results for input(s): CKTOTAL, CKMB, CKMBINDEX, TROPONINI in the last 168 hours. BNP (last 3 results) No results for input(s): PROBNP in the last 8760 hours. HbA1C: No results for input(s): HGBA1C in the last 72 hours. CBG: No results for input(s): GLUCAP in the last 168 hours. Lipid Profile: No results for input(s): CHOL, HDL, LDLCALC, TRIG, CHOLHDL, LDLDIRECT in the last 72 hours. Thyroid Function Tests: No results for input(s): TSH, T4TOTAL, FREET4, T3FREE, THYROIDAB in the last 72 hours. Anemia Panel: No results for input(s): VITAMINB12, FOLATE, FERRITIN, TIBC, IRON, RETICCTPCT in the last 72 hours. Urine analysis:    Component Value Date/Time   COLORURINE YELLOW 11/02/2012 0428   APPEARANCEUR CLOUDY (A) 11/02/2012 0428   LABSPEC 1.011 11/02/2012 0428   PHURINE 8.0 11/02/2012 0428   GLUCOSEU 250 (A) 11/02/2012 0428   HGBUR LARGE (A) 11/02/2012  0428   BILIRUBINUR NEGATIVE 11/02/2012 0428   KETONESUR NEGATIVE 11/02/2012 0428   PROTEINUR >300 (A) 11/02/2012 0428   UROBILINOGEN 0.2 11/02/2012 0428   NITRITE NEGATIVE 11/02/2012 0428   LEUKOCYTESUR NEGATIVE 11/02/2012 0428   Sepsis Labs: @LABRCNTIP (procalcitonin:4,lacticidven:4) )No results found for this or any previous visit (from the past 240 hour(s)).   Radiological Exams on Admission: Dg Chest 2 View  Result Date: 10/29/2016 CLINICAL DATA:  Chest pain and shortness of breath EXAM: CHEST  2 VIEW COMPARISON:  10/06/2016 FINDINGS: Post sternotomy changes and postsurgical changes in the upper abdomen. Right-sided central venous catheter tip overlies the proximal to mid right atrium. There are small bilateral effusions. There is mild cardiomegaly with central vascular congestion and mild interstitial edema. IMPRESSION: 1. Cardiomegaly with central vascular congestion and increased interstitial edema. Slight increased small bilateral effusions. Electronically Signed   By: Donavan Foil M.D.   On: 10/29/2016 20:23    EKG: Independently reviewed. Normal sinus rhythm with IVCD and LVH.  Assessment/Plan Principal Problem:   Chest pain Active Problems:   Hypothyroidism   End stage renal disease on dialysis [facility])   CAD (coronary artery disease), native coronary artery  History of prosthetic aortic valve replacement   S/P liver transplant ([place])   PAD (peripheral artery disease) (HCC)   History of liver transplant ([place])   S/P bilateral BKA (below knee amputation) ([place])   Type 2 diabetes mellitus with peripheral neuropathy ([place])   H/O mitral valve replacement with mechanical valve    1. Chest pain with possible non-ST elevation MI - patient is already on Coumadin for mechanical valve. Coumadin will be dosed per pharmacy. Cycle cardiac markers patient is personally chest pain-free. Continue metoprolol Ranexa and when necessary nitroglycerin. I have requested cardiology  consult. 2. ESRD on hemodialysis on Monday Wednesday and Friday - had dialysis yesterday. Follow metabolic panel. 3. History of liver transplant on Prograf. 4. History of mechanical aortic and mitral valve on Coumadin. Coumadin will be dosed per pharmacy. 5. Diabetes mellitus type 2 on diet. 6. Anemia probably from ESRD - follow CBC. 7. Hypothyroidism on Synthroid. 8. Chronic systolic heart failure - volume managed through dialysis.  I have reviewed patient's old charts and labs.   DVT prophylaxis: Coumadin. Code Status: DO NOT RESUSCITATE.  Family Communication: Discussed with patient.  Disposition Plan: Home.  Consults called: Cardiology.  Admission status: Observation.    Rise Patience MD [facility] Pager [phone].  If 7PM-7AM, please contact night-coverage [email or web address] Password [email or web address]  10/30/2016, 12:07 AM

## 2016-10-30 NOTE — Progress Notes (Signed)
[name] is a 61 y.o. female with history of ESRD on hemodialysis on Monday Wednesday and Friday who was recently admitted and discharged 2 weeks ago after being treated for non-ST elevation MI conservatively patient also had a cardiac cath in July 3 months ago during which diffuse disease was found presents to the ER with complaints of chest pain. She was admitted for evaluation of ACS.   PLAN;  Serial troponins.  Repeat EKG in am.  Cardiology consult.  Nephrology for HD.    Hosie Poisson, MD [phone]

## 2016-10-30 NOTE — Progress Notes (Signed)
[place] for warfarin  Indication: mechanical aortic valve   Allergies  Allergen Reactions  . Acetaminophen Other (See Comments)    Liver transplant recipient   . Morphine And Related Itching  . Codeine Itching  . Mirtazapine Other (See Comments)    hallucination    Patient Measurements: Height: 5\' 4"  (162.6 cm) Weight: 102 lb (46.3 kg) IBW/kg (Calculated) : 54.7  Vital Signs: BP: 118/76 (09/20 0745) Pulse Rate: 101 (09/20 0600)  Labs:  Recent Labs  10/29/16 1900 10/29/16 1953 10/29/16 2115 10/30/16 0056 10/30/16 0629  HGB 11.0*  --   --   --  10.5*  HCT 33.9*  --   --   --  32.2*  PLT 233  --   --   --  149*  LABPROT  --   --  36.3*  --  36.7*  INR  --   --  3.69  --  3.74  CREATININE  --  2.97*  --   --  3.54*  TROPONINI  --   --   --  0.61* 0.61*    Estimated Creatinine Clearance: 12 mL/min (A) (by C-G formula based on SCr of 3.54 mg/dL (H)).   Medical History: Past Medical History:  Diagnosis Date  . Anemia    takes Folic Acid daily  . Anxiety   . Arthritis    "left hand, back" (08/30/2012)  . Asthma   . CAD (coronary artery disease) Jan. 2015   Cath: 20% LAD, 50% D1; s/p LIMA-LAD  . Chest pain 03/06/2015  . CHF (congestive heart failure) ([place]) 05/2016  . Chronic back pain   . Chronic constipation    takes MIralax and Colace daily  . COPD (chronic obstructive pulmonary disease) ([place])   . Depression    takes Cymbalta for "severe" depression  . ESRD on dialysis [facility])    "MWF; [facility]" (08/12/2016)  . GERD (gastroesophageal reflux disease)    takes Omeprazole daily  . Headache    "at least monthly" (08/30/2012)  . Hepatitis C   . History of blood transfusion    "several" (08/30/2012)  . Hypertension    takes Metoprolol and Lisinopril daily, sees Dr Bea Graff  . Hypothyroidism    takes Synthroid daily  . Migraine    "last migraine was in 2013" (08/30/2012)  . Myocardial infarction ([place])   . Neuromuscular  disorder ([place])    carpal tunnel in right hand  . Obesity   . Peripheral vascular disease (HCC) hands and legs  . Pneumonia    "today and several times before" (08/30/2012)  . 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 S/P bilateral BKA (below knee amputation) ([place]) 04/30/2016  . 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 SVT (supraventricular tachycardia) ([place]) 06/09/14  . Tobacco abuse   . Type II diabetes mellitus (HCC)    Levemir 2units daily if > 150    Assessment: 61 yo female admitted with chest pain. Patient on warfarin PTA for mechanical aortic valve.   INR on admission 3.69, up slightly to 3.74 this AM. Hg stable, plt down to 149. No bleed documented  Per [facility] visit on 9/18, patient INR was 6. Patient instructed to hold warfarin Tues/Wed/Thurs and resume with dose of 2mg  on all days, except 1mg  on MonFri.   Goal of Therapy:  INR 2.5-3.5  Plan:  Hold warfarin tonight Daily INR and CBC as needed Monitor for s/s bleeding  Elicia Lamp, PharmD, BCPS Clinical Pharmacist 10/30/2016 8:17 AM

## 2016-10-30 NOTE — Telephone Encounter (Signed)
INR order/coumadin orders signed by Dr. [name] and faxed to [facility].  [phone]

## 2016-10-30 NOTE — ED Notes (Signed)
Pt seems more lethargic at this time and difficult to arouse. Able to wake patient up she states she feels better and that she is tired. She is AOX4. Will hold medications until pt is more awake.

## 2016-10-30 NOTE — ED Notes (Signed)
Spoke to MD regarding intermittent chest pressure. MD advised to administer lopressor at this time. Pt denies pressure currently.

## 2016-10-30 NOTE — Consult Note (Signed)
[facility] Renal Consultation Note    Indication for Consultation:  Management of ESRD/hemodialysis; anemia, hypertension/volume and secondary hyperparathyroidism PCP:  HPI: [name] is a 61 y.o. female. ESRD 2/2 , on HD, MWF at [facility], first starting on 11/2009. 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  Of note she has had multiple failed accesses in the past, but is overall compliant with her prescribed HD treatments. She ran her full treatment on 9/19 and tolerated it well, with no symptoms.  [name] presented to the ED 1 day ago, with complaints of chest pain and shortness of breath that started that evening at home after dialysis. Associated symptoms included weakness, abdominal pain, nausea, vomiting x1, and SOB.    In the ED, providers note she appeared fatigued and weak. Chest pain resolved while in the ED, and patient was admitted for further management due to extensive history. Pertinant labs included mildly elevated troponins 0.54 >0.61 and EKG without specific changes.   Patient seen at bedside in ED.  She admits to current substernal pressure and pain, SOB with exertion, abdominal pain, constipation and weakness. Denies current N/V/D and edema.   Past Medical History:  Diagnosis Date  . Anemia    takes Folic Acid daily  . Anxiety   . Arthritis    "left hand, back" (08/30/2012)  . Asthma   . CAD (coronary artery disease) Jan. 2015   Cath: 20% LAD, 50% D1; s/p LIMA-LAD  . Chest pain 03/06/2015  . CHF (congestive heart failure) ([place]) 05/2016  . Chronic back pain   . Chronic constipation    takes MIralax and Colace daily  . COPD (chronic obstructive pulmonary disease) ([place])   . Depression    takes Cymbalta for "severe" depression  .  ESRD on dialysis [facility])    "MWF; [facility]" (08/12/2016)  . GERD (gastroesophageal reflux disease)    takes Omeprazole daily  . Headache    "at least monthly" (08/30/2012)  . Hepatitis C   . History of blood transfusion    "several" (08/30/2012)  . Hypertension    takes Metoprolol and Lisinopril daily, sees Dr Bea Graff  . Hypothyroidism    takes Synthroid daily  . Migraine    "last migraine was in 2013" (08/30/2012)  . Myocardial infarction ([place])   . Neuromuscular disorder ([place])    carpal tunnel in right hand  . Obesity   . Peripheral vascular disease (HCC) hands and legs  . Pneumonia    "today and several times before" (08/30/2012)  . 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 S/P bilateral BKA (below knee amputation) ([place]) 04/30/2016  . 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 SVT (supraventricular tachycardia) ([place]) 06/09/14  . Tobacco abuse   . Type II diabetes mellitus (HCC)    Levemir 2units daily if > 150   Past Surgical History:  Procedure Laterality Date  . ABDOMINAL AORTOGRAM W/LOWER EXTREMITY N/A 07/04/2016   Procedure: Abdominal Aortogram w/Lower Extremity;  Surgeon: Elam Dutch, MD;  Location: [place] CV LAB;  Service: Cardiovascular;  Laterality: N/A;  . AMPUTATION Left 04/24/2016   Procedure: AMPUTATION BELOW KNEE;  Surgeon: Judeth Cornfield  Scot Dock, MD;  Location: [place];  Service: Vascular;  Laterality: Left;  . AMPUTATION Right 07/08/2016   Procedure: AMPUTATION BELOW KNEE;  Surgeon: Angelia Mould, MD;  Location: [place];  Service: Vascular;  Laterality: Right;  . AORTIC VALVE REPLACEMENT N/A 03/15/2013   AVR; Surgeon: Ivin Poot, MD;  Location: [facility] OR; Open Heart Surgery;  86mm[employer] mechanical prosthesis, top hat valve  . ARTERIOVENOUS GRAFT PLACEMENT Left 10/03/10    forearm  . AV FISTULA PLACEMENT  01/29/2011   Procedure: INSERTION OF ARTERIOVENOUS (AV)  GORE-TEX GRAFT ARM;  Surgeon: Elam Dutch, MD;  Location: [facility] OR;  Service: Vascular;  Laterality: Right;  . AV FISTULA PLACEMENT  03/10/2011   Procedure: INSERTION OF ARTERIOVENOUS (AV) GORE-TEX GRAFT THIGH;  Surgeon: Elam Dutch, MD;  Location: [place];  Service: Vascular;  Laterality: Left;  . AV FISTULA PLACEMENT Left 07/21/2016   Procedure: CREATION OF LEFT UPPER ARM ARTERIOVENOUS (AV) FISTULA;  Surgeon: Conrad [place], MD;  Location: [place];  Service: Vascular;  Laterality: Left;  . [place] REMOVAL  12/23/2010   Procedure: REMOVAL OF ARTERIOVENOUS GORETEX GRAFT ([place]);  Surgeon: Elam Dutch, MD;  Location: [place];  Service: Vascular;  Laterality: Left;  procedure started @1736 -1852  . CHOLECYSTECTOMY  1993  . CORONARY ANGIOGRAPHY N/A 08/12/2016   Procedure: CORONARY ANGIOGRAPHY (CATH LAB);  Surgeon: Leonie Man, MD;  Location: [place] CV LAB;  Service: Cardiovascular;  Laterality: N/A;  . CORONARY ARTERY BYPASS GRAFT N/A 03/15/2013   Procedure: CORONARY ARTERY BYPASS GRAFTING (CABG) times one using left internal mammary artery.;  Surgeon: Ivin Poot, MD;  Location: [place];  Service: Open Heart Surgery;  Laterality: N/A;  POSS CABG X 1  . CYSTOSCOPY  1990's  . INSERTION OF DIALYSIS CATHETER  12/23/2010   Procedure: INSERTION OF DIALYSIS CATHETER;  Surgeon: Elam Dutch, MD;  Location: [place];  Service: Vascular;  Laterality: Right;  Right Internal Jugular 28cm dialysis catheter insertion procedure time 1701-1720   . INSERTION OF DIALYSIS CATHETER Right 03/11/2016   Procedure: INSERTION OF DIALYSIS CATHETER;  Surgeon: Angelia Mould, MD;  Location: [place];  Service: Vascular;  Laterality: Right;  . INTRAOPERATIVE TRANSESOPHAGEAL ECHOCARDIOGRAM N/A 03/15/2013   Procedure: INTRAOPERATIVE TRANSESOPHAGEAL ECHOCARDIOGRAM;  Surgeon: Ivin Poot, MD;  Location: [place];  Service: Open Heart Surgery;  Laterality: N/A;  . LEFT HEART CATHETERIZATION WITH CORONARY ANGIOGRAM N/A  07/29/2012   Procedure: LEFT HEART CATHETERIZATION WITH CORONARY ANGIOGRAM;  Surgeon: Troy Sine, MD;  Location: [facility] CATH LAB;  Service: Cardiovascular;  Laterality: N/A;  . LEFT HEART CATHETERIZATION WITH CORONARY ANGIOGRAM N/A 03/10/2013   Procedure: LEFT HEART CATHETERIZATION WITH CORONARY ANGIOGRAM;  Surgeon: Troy Sine, MD;  Location: [facility] CATH LAB;  Service: Cardiovascular;  Laterality: N/A;  . LEFT HEART CATHETERIZATION WITH CORONARY/GRAFT ANGIOGRAM N/A 12/24/2011   Procedure: LEFT HEART CATHETERIZATION WITH Beatrix Fetters;  Surgeon: [name], MD;  Location: [facility] CATH LAB;  Service: Cardiovascular;  Laterality: N/A;  . LEFT HEART CATHETERIZATION WITH CORONARY/GRAFT ANGIOGRAM N/A 12/16/2013   Procedure: LEFT HEART CATHETERIZATION WITH Beatrix Fetters;  Surgeon: Troy Sine, MD;  Location: [facility] CATH LAB;  Service: Cardiovascular;  Laterality: N/A;  . left kidney removed    . LIGATION ARTERIOVENOUS GORTEX GRAFT Left 03/11/2016   Procedure: LIGATION THIGH ARTERIOVENOUS GORTEX GRAFT;  Surgeon: Angelia Mould, MD;  Location: [place];  Service: Vascular;  Laterality: Left;  . LIGATION OF ARTERIOVENOUS  FISTULA Left 08/11/2016  Procedure: LIGATION OF ARTERIOVENOUS  FISTULA;  Surgeon: Elam Dutch, MD;  Location: [place];  Service: Vascular;  Laterality: Left;  . LIVER TRANSPLANT  10/25/2009   sees Dr Ferol Luz 1 every 6 months, saw last in Dec 2013. [name] Coord [phone]  . PERIPHERAL VASCULAR CATHETERIZATION N/A 11/06/2015   Procedure: Abdominal Aortogram;  Surgeon: Serafina Mitchell, MD;  Location: [place] CV LAB;  Service: Cardiovascular;  Laterality: N/A;  . PERIPHERAL VASCULAR CATHETERIZATION N/A 11/06/2015   Procedure: Lower Extremity Angiography;  Surgeon: Serafina Mitchell, MD;  Location: [place] CV LAB;  Service: Cardiovascular;  Laterality: N/A;  . PERIPHERAL VASCULAR CATHETERIZATION  11/06/2015   Procedure: Peripheral Vascular Atherectomy;   Surgeon: Serafina Mitchell, MD;  Location: [place] CV LAB;  Service: Cardiovascular;;  Left Superficial femoral  . SHUNTOGRAM Left 05/15/2014   Procedure: SHUNTOGRAM;  Surgeon: Conrad [place], MD;  Location: [facility] CATH LAB;  Service: Cardiovascular;  Laterality: Left;  . SMALL INTESTINE SURGERY  90's  . SPINAL GROWTH RODS  2010   "put 2 metal rods in my back; they had detetriorated" (08/30/2012)  . THROMBECTOMY    . THROMBECTOMY AND REVISION OF ARTERIOVENTOUS (AV) GORETEX  GRAFT Left 03/30/2014  . THROMBECTOMY AND REVISION OF ARTERIOVENTOUS (AV) GORETEX  GRAFT Left 03/30/2014   Procedure: THROMBECTOMY AND REVISION OF ARTERIOVENTOUS (AV) GORETEX  GRAFT;  Surgeon: Conrad Virgil, MD;  Location: [place];  Service: Vascular;  Laterality: Left;  . TUBAL LIGATION  1990's   Family History  Problem Relation Age of Onset  . Cancer Mother   . Diabetes Mother   . Hypertension Mother   . Stroke Mother   . Cancer Father   . Anesthesia problems Neg Hx   . Hypotension Neg Hx   . Malignant hyperthermia Neg Hx   . Pseudochol deficiency Neg Hx    Social History:  reports that she has been smoking Cigarettes.  She has a 10.75 pack-year smoking history. She has never used smokeless tobacco. She reports that she does not drink alcohol or use drugs. Allergies  Allergen Reactions  . Acetaminophen Other (See Comments)    Liver transplant recipient   . Morphine And Related Itching  . Codeine Itching  . Mirtazapine Other (See Comments)    hallucination   Prior to Admission medications   Medication Sig Start Date End Date Taking? Authorizing Provider  ALPRAZolam Duanne Moron) 0.5 MG tablet Take 0.5 mg by mouth 3 (three) times daily as needed for anxiety.  08/26/16  Yes [provider]  Amino Acids-Protein Hydrolys (FEEDING SUPPLEMENT, PRO-STAT SUGAR FREE 64,) LIQD Take 30 mLs by mouth 2 (two) times daily. 08/28/16  Yes Lavina Hamman, MD  aspirin EC 81 MG tablet Take 81 mg by mouth daily at 12 noon.    Yes  [provider]  calcitRIOL (ROCALTROL) 0.5 MCG capsule Take 1 capsule (0.5 mcg total) by mouth every Monday, Wednesday, and Friday with hemodialysis. 07/25/16  Yes Angiulli, Lavon Paganini, PA-C  calcium acetate (PHOSLO) 667 MG capsule Take 1 capsule (667 mg total) by mouth 3 (three) times daily with meals. 07/24/16  Yes Angiulli, Lavon Paganini, PA-C  calcium carbonate (TUMS - DOSED IN MG ELEMENTAL CALCIUM) 500 MG chewable tablet Chew 1 tablet by mouth daily.   Yes [provider]  carvedilol (COREG) 3.125 MG tablet Take 3.125 mg by mouth 2 (two) times daily with a meal.  09/18/16  Yes [provider]  feeding supplement (BOOST / RESOURCE  BREEZE) LIQD Take 1 Container by mouth 3 (three) times daily between meals. 04/25/16  Yes Reyne Dumas, MD  gabapentin (NEURONTIN) 300 MG capsule Take 300 mg by mouth at bedtime. Daughter reports patient taking 100 mg on hemodialysis days   Yes [provider]  glucose 4 GM chewable tablet Chew 4 g by mouth once as needed for low blood sugar.  12/27/15 12/26/16 Yes [provider]  levothyroxine (SYNTHROID, LEVOTHROID) 200 MCG tablet Take 1 tablet (200 mcg total) by mouth daily before breakfast. 10/03/16  Yes Barton Dubois, MD  lidocaine (LIDODERM) 5 % Place 1 patch onto the skin daily. Remove & Discard patch within 12 hours or as directed by MD 10/09/16  Yes Verlee Monte, MD  methocarbamol (ROBAXIN) 500 MG tablet Take 0.5 tablets (250 mg total) by mouth every 6 (six) hours as needed for muscle spasms. 07/24/16  Yes Angiulli, Lavon Paganini, PA-C  metoCLOPramide (REGLAN) 5 MG tablet Take 1 tablet (5 mg total) by mouth 2 (two) times daily before a meal. Decrease to once a day before breakfast after a couple of weeks. Patient taking differently: Take 5 mg by mouth daily as needed for nausea or vomiting.  07/24/16  Yes Angiulli, Lavon Paganini, PA-C  metoprolol tartrate (LOPRESSOR) 25 MG tablet Take 0.5 tablets (12.5 mg total) by mouth 2 (two) times  daily. 10/09/16  Yes Verlee Monte, MD  midodrine (PROAMATINE) 10 MG tablet Take 1 tablet (10 mg total) by mouth 3 (three) times daily with meals. 10/02/16  Yes Barton Dubois, MD  mirtazapine (REMERON) 15 MG tablet Take 15 mg by mouth at bedtime.  08/17/16  Yes [provider]  multivitamin (RENA-VIT) TABS tablet Take 1 tablet by mouth at bedtime. 05/20/16  Yes Love, Ivan Anchors, PA-C  nitroGLYCERIN (NITROSTAT) 0.4 MG SL tablet Place 1 tablet (0.4 mg total) under the tongue every 5 (five) minutes as needed for chest pain. 03/20/14  Yes Elgergawy, Silver Huguenin, MD  ondansetron (ZOFRAN ODT) 8 MG disintegrating tablet 8mg  ODT q4 hours prn nausea Patient taking differently: Take 8 mg by mouth every 4 (four) hours as needed for nausea or vomiting.  09/18/16  Yes Delo, Nathaneil Canary, MD  oxyCODONE (OXY IR/ROXICODONE) 5 MG immediate release tablet Take 1 tablet (5 mg total) by mouth every 4 (four) hours as needed for severe pain. 10/09/16  Yes Verlee Monte, MD  promethazine (PHENERGAN) 12.5 MG tablet Take 12.5 mg by mouth every 6 (six) hours as needed for nausea or vomiting.   Yes [provider]  ranolazine (RANEXA) 500 MG 12 hr tablet Take 1 tablet (500 mg total) by mouth daily. 10/09/16  Yes Verlee Monte, MD  tacrolimus (PROGRAF) 1 MG capsule Take 2 mg by mouth 2 (two) times daily.    Yes [provider]  traMADol (ULTRAM) 50 MG tablet Take 50 mg by mouth 3 (three) times daily as needed for moderate pain.   Yes [provider]  traZODone (DESYREL) 50 MG tablet Take 50 mg by mouth at bedtime.   Yes [provider]  warfarin (COUMADIN) 2 MG tablet Take 1-2 tablets by mouth daily as directed by coumadin clinic Patient taking differently: Take 2 mg by mouth daily at 6 PM.  10/03/16  Yes Barton Dubois, MD   Current Facility-Administered Medications  Medication Dose Route Frequency Provider Last Rate Last Dose  . ALPRAZolam Duanne Moron) tablet 0.5 mg  0.5 mg Oral TID PRN Rise Patience, MD      . aspirin EC  tablet 81 mg  81 mg Oral Q1200 Rise Patience, MD   81 mg at 10/30/16 1306  . [START ON 10/31/2016] calcitRIOL (ROCALTROL) capsule 0.5 mcg  0.5 mcg Oral Q M,W,F-HD Rise Patience, MD      . calcium acetate (PHOSLO) capsule 667 mg  667 mg Oral TID WC Rise Patience, MD   Stopped at 10/30/16 [phone]  . calcium carbonate (TUMS - dosed in mg elemental calcium) chewable tablet 200 mg of elemental calcium  1 tablet Oral QAC supper Rise Patience, MD      . feeding supplement (BOOST / RESOURCE BREEZE) liquid 1 Container  1 Container Oral TID BM Rise Patience, MD      . feeding supplement (PRO-STAT SUGAR FREE 64) liquid 30 mL  30 mL Oral BID Rise Patience, MD      . gabapentin (NEURONTIN) capsule 300 mg  300 mg Oral QHS Rise Patience, MD      . levothyroxine (SYNTHROID, LEVOTHROID) tablet 200 mcg  200 mcg Oral QAC breakfast Rise Patience, MD   200 mcg at 10/30/16 1302  . lidocaine (LIDODERM) 5 % 1 patch  1 patch Transdermal Q24H Rise Patience, MD      . methocarbamol (ROBAXIN) tablet 250 mg  250 mg Oral Q6H PRN Rise Patience, MD      . metoCLOPramide (REGLAN) tablet 5 mg  5 mg Oral Daily PRN Rise Patience, MD      . metoprolol tartrate (LOPRESSOR) tablet 12.5 mg  12.5 mg Oral BID Rise Patience, MD   Stopped at 10/30/16 1142  . midodrine (PROAMATINE) tablet 10 mg  10 mg Oral TID WC Rise Patience, MD   10 mg at 10/30/16 1301  . mirtazapine (REMERON) tablet 15 mg  15 mg Oral QHS Rise Patience, MD   15 mg at 10/30/16 0205  . multivitamin (RENA-VIT) tablet 1 tablet  1 tablet Oral QHS Rise Patience, MD   1 tablet at 10/30/16 0206  . nitroGLYCERIN (NITROSTAT) SL tablet 0.4 mg  0.4 mg Sublingual Q5 min PRN Rise Patience, MD      . ondansetron [facility]) tablet 4 mg  4 mg Oral Q6H PRN Rise Patience, MD       Or  . ondansetron [facility]) injection 4 mg  4 mg Intravenous Q6H PRN  Rise Patience, MD      . oxyCODONE (Oxy IR/ROXICODONE) immediate release tablet 5 mg  5 mg Oral Q4H PRN Rise Patience, MD      . promethazine (PHENERGAN) tablet 12.5 mg  12.5 mg Oral Q6H PRN Rise Patience, MD      . ranolazine (RANEXA) 12 hr tablet 500 mg  500 mg Oral Daily Rise Patience, MD   500 mg at 10/30/16 1301  . tacrolimus (PROGRAF) capsule 2 mg  2 mg Oral BID Rise Patience, MD   2 mg at 10/30/16 1302  . traMADol (ULTRAM) tablet 50 mg  50 mg Oral TID PRN Rise Patience, MD      . traZODone (DESYREL) tablet 50 mg  50 mg Oral QHS Rise Patience, MD       Current Outpatient Prescriptions  Medication Sig Dispense Refill  . ALPRAZolam (XANAX) 0.5 MG tablet Take 0.5 mg by mouth 3 (three) times daily as needed for anxiety.     . Amino Acids-Protein Hydrolys (FEEDING SUPPLEMENT, PRO-STAT SUGAR FREE 64,) LIQD Take 30 mLs  by mouth 2 (two) times daily. 900 mL 0  . aspirin EC 81 MG tablet Take 81 mg by mouth daily at 12 noon.     . calcitRIOL (ROCALTROL) 0.5 MCG capsule Take 1 capsule (0.5 mcg total) by mouth every Monday, Wednesday, and Friday with hemodialysis. 30 capsule 1  . calcium acetate (PHOSLO) 667 MG capsule Take 1 capsule (667 mg total) by mouth 3 (three) times daily with meals. 90 capsule 1  . calcium carbonate (TUMS - DOSED IN MG ELEMENTAL CALCIUM) 500 MG chewable tablet Chew 1 tablet by mouth daily.    . carvedilol (COREG) 3.125 MG tablet Take 3.125 mg by mouth 2 (two) times daily with a meal.     . feeding supplement (BOOST / RESOURCE BREEZE) LIQD Take 1 Container by mouth 3 (three) times daily between meals. 1 Container 100  . gabapentin (NEURONTIN) 300 MG capsule Take 300 mg by mouth at bedtime. Daughter reports patient taking 100 mg on hemodialysis days    . glucose 4 GM chewable tablet Chew 4 g by mouth once as needed for low blood sugar.     . levothyroxine (SYNTHROID, LEVOTHROID) 200 MCG tablet Take 1 tablet (200 mcg total) by mouth  daily before breakfast. 30 tablet 1  . lidocaine (LIDODERM) 5 % Place 1 patch onto the skin daily. Remove & Discard patch within 12 hours or as directed by MD 30 patch 0  . methocarbamol (ROBAXIN) 500 MG tablet Take 0.5 tablets (250 mg total) by mouth every 6 (six) hours as needed for muscle spasms. 60 tablet 0  . metoCLOPramide (REGLAN) 5 MG tablet Take 1 tablet (5 mg total) by mouth 2 (two) times daily before a meal. Decrease to once a day before breakfast after a couple of weeks. (Patient taking differently: Take 5 mg by mouth daily as needed for nausea or vomiting. ) 60 tablet 0  . metoprolol tartrate (LOPRESSOR) 25 MG tablet Take 0.5 tablets (12.5 mg total) by mouth 2 (two) times daily. 60 tablet 0  . midodrine (PROAMATINE) 10 MG tablet Take 1 tablet (10 mg total) by mouth 3 (three) times daily with meals. 90 tablet 1  . mirtazapine (REMERON) 15 MG tablet Take 15 mg by mouth at bedtime.   2  . multivitamin (RENA-VIT) TABS tablet Take 1 tablet by mouth at bedtime. 30 tablet 0  . nitroGLYCERIN (NITROSTAT) 0.4 MG SL tablet Place 1 tablet (0.4 mg total) under the tongue every 5 (five) minutes as needed for chest pain. 30 tablet 0  . ondansetron (ZOFRAN ODT) 8 MG disintegrating tablet 8mg  ODT q4 hours prn nausea (Patient taking differently: Take 8 mg by mouth every 4 (four) hours as needed for nausea or vomiting. ) 6 tablet 0  . oxyCODONE (OXY IR/ROXICODONE) 5 MG immediate release tablet Take 1 tablet (5 mg total) by mouth every 4 (four) hours as needed for severe pain. 20 tablet 0  . promethazine (PHENERGAN) 12.5 MG tablet Take 12.5 mg by mouth every 6 (six) hours as needed for nausea or vomiting.    . ranolazine (RANEXA) 500 MG 12 hr tablet Take 1 tablet (500 mg total) by mouth daily. 30 tablet 0  . tacrolimus (PROGRAF) 1 MG capsule Take 2 mg by mouth 2 (two) times daily.     . traMADol (ULTRAM) 50 MG tablet Take 50 mg by mouth 3 (three) times daily as needed for moderate pain.    . traZODone  (DESYREL) 50 MG tablet Take 50 mg by mouth  at bedtime.    [name] warfarin (COUMADIN) 2 MG tablet Take 1-2 tablets by mouth daily as directed by coumadin clinic (Patient taking differently: Take 2 mg by mouth daily at 6 PM. ) 90 tablet 1   Labs: Basic Metabolic Panel:  Recent Labs Lab 10/29/16 1953 10/30/16 0629  NA 133* 135  K 5.8* 4.5  CL 99* 100*  CO2 26 25  GLUCOSE 151* 142*  BUN 7 9  CREATININE 2.97* 3.54*  CALCIUM 8.4* 8.6*   Liver Function Tests:  Recent Labs Lab 10/29/16 2115  AST 46*  ALT 19  ALKPHOS 167*  BILITOT 1.8*  PROT 6.8  ALBUMIN 1.9*   No results for input(s): LIPASE, AMYLASE in the last 168 hours.  Recent Labs Lab 10/29/16 2115  AMMONIA 42*   CBC:  Recent Labs Lab 10/29/16 1900 10/30/16 0629  WBC 11.4* 11.7*  HGB 11.0* 10.5*  HCT 33.9* 32.2*  MCV 90.2 91.7  PLT 233 149*   Cardiac Enzymes:  Recent Labs Lab 10/30/16 0056 10/30/16 0629 10/30/16 1148  TROPONINI 0.61* 0.61* 0.67*   CBG: No results for input(s): GLUCAP in the last 168 hours. Iron Studies: No results for input(s): IRON, TIBC, TRANSFERRIN, FERRITIN in the last 72 hours. Studies/Results: Dg Chest 2 View  Result Date: 10/29/2016 CLINICAL DATA:  Chest pain and shortness of breath EXAM: CHEST  2 VIEW COMPARISON:  10/06/2016 FINDINGS: Post sternotomy changes and postsurgical changes in the upper abdomen. Right-sided central venous catheter tip overlies the proximal to mid right atrium. There are small bilateral effusions. There is mild cardiomegaly with central vascular congestion and mild interstitial edema. IMPRESSION: 1. Cardiomegaly with central vascular congestion and increased interstitial edema. Slight increased small bilateral effusions. Electronically Signed   By: Donavan Foil M.D.   On: 10/29/2016 20:23    ROS: As per HPI otherwise negative.   Physical Exam: Vitals:   10/30/16 1130 10/30/16 1145 10/30/16 1230 10/30/16 1300  BP: 108/72 109/80 99/69 95/84   Pulse: 99   99   Resp: 18 12 10 13   Temp:      TempSrc:      SpO2: 100%  100% 92%  Weight:      Height:         General: ill appearing, cachexic, NAD Head: NCAT, sclera not icteric, MMM Neck: Supple.  Lungs: tachypnea, on O2, +crackles in bases, breath sounds diminished Heart: irregularly regular rhythm, tachycardia, no murmur, rub or gallop Abdomen: soft, NT, + BS, +incisional scar on RUQ and LUQ Lower extremities: B/L BKA,  Neuro: A & O  X 3. Moves all extremities spontaneously. Psych:  Flat affect Dialysis Access: TDC,   Dialysis Orders: MWF 3.75hrs, 180N, BFR 450, DFR autoflow 1.5, EDW 45.5kg, 3K/2.25Ca, Linear Sodium modeling.    Mircera 156mcg IV q 2wks last 9/12, Calcitriol 0.55mcg qHD NO Heparin Access: R IJ TDC   Assessment/Plan: 1.  Chest pain   -Patient admitted several times over the last few months for chest pain/CAD/NSTEMI, known diffuse multivessel disease. Continue current medical management with metoprolol and Ranexa.   -Cardiology consult requested by hospitalist  2.  ESRD - MWF at  [facility] treatments well, compliant, no CP with HD, last full HD 9/19.  -Inpatient dialysis on regular schedule, MWF, orders written. Net UF goal 1-1.5L. No heparin.  -RFP and CBC per HD unless otherwise ordered.  3.  Hypertension/volume   -Hypotension on midodrine.  -Titrate down volume, keep SBP>100 4.  Anemia    -Hgb stable,  10.5, continue to monitor with daily CBC. Last mircera 180mch IV on 7/41.   5.  Metabolic bone disease   -Corrected Calcium 10.28  -last P 6.2 on 8/24, repeat prior to HD tomorrow and determine if binders needed, on calcium based binder and borderline hypercalcemic.  -Last PTH 342, calcitriol 0.42mcg qHD. 6.  Nutrition   -Regular diet, Nepro TID, Renavite. Albumin low 7. Chronic combined systolic and diastolic congestive heart failure  -ECHO 09/30/16 - EF 28-78%, grade 2 diastolic dysfunction  -Volume control with HD, on BB 8. History of  AVR  -Anticoagulated with coumadin.   -Supra therapeutic INR 3.69-3.74  -Pharmacy following/managing, Goal of therapy 2.5-3.5 9. Type 2 DM on Insulin  -currently diet controlled  10. Hypothyroidism  -Continue levothyroxine 11. H/o Liver transplant  -Continue Prograf  Jen Mow, PA-C [facility] 10/30/2016, 1:40 PM   Pt seen, examined and agree w A/P as above. ESRD pt with recurrent chest pain, pt w/ hx CAD (CABG 1V LIMA-LAD at time of AVR 2015), shown to have new disease by cath here July '18 (LCX/ RCA branch disease). Followed by cardiology, medical management as of last two admissions. Plan is for HD tomorrow.   [name] MD [name] pager [phone]   10/31/2016, 8:28 AM

## 2016-10-30 NOTE — Progress Notes (Signed)
ANTICOAGULATION CONSULT NOTE - Initial Consult  Pharmacy Consult for warfarin  Indication: mechanical aortic valve   Allergies  Allergen Reactions  . Acetaminophen Other (See Comments)    Liver transplant recipient   . Morphine And Related Itching  . Codeine Itching  . Mirtazapine Other (See Comments)    hallucination    Patient Measurements: Height: 5\' 4"  (162.6 cm) Weight: 102 lb (46.3 kg) IBW/kg (Calculated) : 54.7  Vital Signs: Temp: 98.5 F (36.9 C) (09/19 1857) Temp Source: Oral (09/19 1857) BP: 115/78 (09/20 0000) Pulse Rate: 83 (09/19 2045)  Labs:  Recent Labs  10/29/16 1900 10/29/16 1953 10/29/16 2115  HGB 11.0*  --   --   HCT 33.9*  --   --   PLT 233  --   --   LABPROT  --   --  36.3*  INR  --   --  3.69  CREATININE  --  2.97*  --     Estimated Creatinine Clearance: 14.4 mL/min (A) (by C-G formula based on SCr of 2.97 mg/dL (H)).   Medical History: Past Medical History:  Diagnosis Date  . Anemia    takes Folic Acid daily  . Anxiety   . Arthritis    "left hand, back" (08/30/2012)  . Asthma   . CAD (coronary artery disease) Jan. 2015   Cath: 20% LAD, 50% D1; s/p LIMA-LAD  . Chest pain 03/06/2015  . CHF (congestive heart failure) ([place]) 05/2016  . Chronic back pain   . Chronic constipation    takes MIralax and Colace daily  . COPD (chronic obstructive pulmonary disease) ([place])   . Depression    takes Cymbalta for "severe" depression  . ESRD on dialysis [facility])    "MWF; [facility]" (08/12/2016)  . GERD (gastroesophageal reflux disease)    takes Omeprazole daily  . Headache    "at least monthly" (08/30/2012)  . Hepatitis C   . History of blood transfusion    "several" (08/30/2012)  . Hypertension    takes Metoprolol and Lisinopril daily, sees Dr Bea Graff  . Hypothyroidism    takes Synthroid daily  . Migraine    "last migraine was in 2013" (08/30/2012)  . Myocardial infarction ([place])   . Neuromuscular disorder ([place])    carpal tunnel in right hand   . Obesity   . Peripheral vascular disease (HCC) hands and legs  . Pneumonia    "today and several times before" (08/30/2012)  . 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 S/P bilateral BKA (below knee amputation) ([place]) 04/30/2016  . 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 SVT (supraventricular tachycardia) ([place]) 06/09/14  . Tobacco abuse   . Type II diabetes mellitus (HCC)    Levemir 2units daily if > 150    Assessment: 61 yo female admitted with chest pain. Patient on warfarin PTA for mechanical aortic valve.   INR on admission 3.69. CBC stable and no bleeding noted.   Per [facility] visit on 9/18, patient INR was 6. Patient instructed to hold warfarin Tues/Wed/Thurs and resume with dose of 2mg  on all days, except 1mg  on MonFri.   Goal of Therapy:  INR 2.5-3.5    Plan:  Hold warfarin tonight Daily INR and CBC as needed Monitor for s/s bleeding  Argie Ramming, PharmD Clinical Pharmacist 10/30/16 12:39 AM

## 2016-10-31 DIAGNOSIS — G9341 Metabolic encephalopathy: Secondary | ICD-10-CM | POA: Diagnosis not present

## 2016-10-31 DIAGNOSIS — L89152 Pressure ulcer of sacral region, stage 2: Secondary | ICD-10-CM | POA: Diagnosis present

## 2016-10-31 DIAGNOSIS — E039 Hypothyroidism, unspecified: Secondary | ICD-10-CM | POA: Diagnosis not present

## 2016-10-31 DIAGNOSIS — R079 Chest pain, unspecified: Secondary | ICD-10-CM | POA: Diagnosis not present

## 2016-10-31 DIAGNOSIS — I2510 Atherosclerotic heart disease of native coronary artery without angina pectoris: Secondary | ICD-10-CM | POA: Diagnosis not present

## 2016-10-31 DIAGNOSIS — N186 End stage renal disease: Secondary | ICD-10-CM | POA: Diagnosis not present

## 2016-10-31 DIAGNOSIS — I739 Peripheral vascular disease, unspecified: Secondary | ICD-10-CM | POA: Diagnosis not present

## 2016-10-31 DIAGNOSIS — N2581 Secondary hyperparathyroidism of renal origin: Secondary | ICD-10-CM | POA: Diagnosis present

## 2016-10-31 DIAGNOSIS — Z681 Body mass index (BMI) 19 or less, adult: Secondary | ICD-10-CM | POA: Diagnosis not present

## 2016-10-31 DIAGNOSIS — Z992 Dependence on renal dialysis: Secondary | ICD-10-CM | POA: Diagnosis not present

## 2016-10-31 DIAGNOSIS — Z89512 Acquired absence of left leg below knee: Secondary | ICD-10-CM | POA: Diagnosis not present

## 2016-10-31 DIAGNOSIS — F17210 Nicotine dependence, cigarettes, uncomplicated: Secondary | ICD-10-CM | POA: Diagnosis present

## 2016-10-31 DIAGNOSIS — K5900 Constipation, unspecified: Secondary | ICD-10-CM | POA: Diagnosis present

## 2016-10-31 DIAGNOSIS — I5042 Chronic combined systolic (congestive) and diastolic (congestive) heart failure: Secondary | ICD-10-CM | POA: Diagnosis present

## 2016-10-31 DIAGNOSIS — E1151 Type 2 diabetes mellitus with diabetic peripheral angiopathy without gangrene: Secondary | ICD-10-CM | POA: Diagnosis present

## 2016-10-31 DIAGNOSIS — E1122 Type 2 diabetes mellitus with diabetic chronic kidney disease: Secondary | ICD-10-CM | POA: Diagnosis present

## 2016-10-31 DIAGNOSIS — I252 Old myocardial infarction: Secondary | ICD-10-CM | POA: Diagnosis not present

## 2016-10-31 DIAGNOSIS — Z66 Do not resuscitate: Secondary | ICD-10-CM | POA: Diagnosis present

## 2016-10-31 DIAGNOSIS — I959 Hypotension, unspecified: Secondary | ICD-10-CM | POA: Diagnosis not present

## 2016-10-31 DIAGNOSIS — D631 Anemia in chronic kidney disease: Secondary | ICD-10-CM | POA: Diagnosis present

## 2016-10-31 DIAGNOSIS — Z944 Liver transplant status: Secondary | ICD-10-CM | POA: Diagnosis not present

## 2016-10-31 DIAGNOSIS — K219 Gastro-esophageal reflux disease without esophagitis: Secondary | ICD-10-CM | POA: Diagnosis present

## 2016-10-31 DIAGNOSIS — I953 Hypotension of hemodialysis: Secondary | ICD-10-CM | POA: Diagnosis not present

## 2016-10-31 DIAGNOSIS — Z89511 Acquired absence of right leg below knee: Secondary | ICD-10-CM | POA: Diagnosis not present

## 2016-10-31 DIAGNOSIS — J449 Chronic obstructive pulmonary disease, unspecified: Secondary | ICD-10-CM | POA: Diagnosis present

## 2016-10-31 DIAGNOSIS — E43 Unspecified severe protein-calorie malnutrition: Secondary | ICD-10-CM | POA: Diagnosis present

## 2016-10-31 DIAGNOSIS — I132 Hypertensive heart and chronic kidney disease with heart failure and with stage 5 chronic kidney disease, or end stage renal disease: Secondary | ICD-10-CM | POA: Diagnosis present

## 2016-10-31 DIAGNOSIS — R0789 Other chest pain: Secondary | ICD-10-CM | POA: Diagnosis present

## 2016-10-31 LAB — PROTIME-INR
INR: 4.32
PROTHROMBIN TIME: 41.1 s — AB (ref 11.4–15.2)

## 2016-10-31 MED ORDER — LACTULOSE 10 GM/15ML PO SOLN
30.0000 g | Freq: Three times a day (TID) | ORAL | Status: DC
  Administered 2016-10-31 (×2): 30 g via ORAL
  Filled 2016-10-31 (×2): qty 45

## 2016-10-31 MED ORDER — BISACODYL 10 MG RE SUPP
10.0000 mg | Freq: Once | RECTAL | Status: AC
  Administered 2016-10-31: 10 mg via RECTAL
  Filled 2016-10-31: qty 1

## 2016-10-31 NOTE — Progress Notes (Signed)
[facility] Progress Note  Subjective: chest still hurting, doesn't think she can tolerated dialysis today  Vitals:   10/31/16 0508 10/31/16 0748 10/31/16 0830 10/31/16 1134  BP: 99/83 114/71  104/69  Pulse: 79     Resp: (!) 21     Temp: 97.6 F (36.4 C) (!) 97.4 F (36.3 C)  (!) 97.5 F (36.4 C)  TempSrc: Oral Axillary  Axillary  SpO2: 97% 95% 93% 100%  Weight: 47.8 kg (105 lb 6.4 oz)     Height:        Inpatient medications: . aspirin EC  81 mg Oral Q1200  . calcitRIOL  0.5 mcg Oral Q M,W,F-HD  . calcium acetate  667 mg Oral TID WC  . calcium carbonate  1 tablet Oral QAC supper  . feeding supplement  1 Container Oral TID BM  . feeding supplement (PRO-STAT SUGAR FREE 64)  30 mL Oral BID  . gabapentin  300 mg Oral QHS  . Influenza vac split quadrivalent PF  0.5 mL Intramuscular Tomorrow-1000  . levothyroxine  200 mcg Oral QAC breakfast  . lidocaine  1 patch Transdermal Q24H  . metoprolol tartrate  12.5 mg Oral BID  . midodrine  10 mg Oral TID WC  . mirtazapine  15 mg Oral QHS  . multivitamin  1 tablet Oral QHS  . tacrolimus  2 mg Oral BID  . traZODone  50 mg Oral QHS    ALPRAZolam, methocarbamol, metoCLOPramide, nitroGLYCERIN, ondansetron **OR** ondansetron (ZOFRAN) IV, oxyCODONE, promethazine, traMADol  Exam: General: ill appearing, cachexic, NAD Head: NCAT, sclera not icteric, MMM Neck: Supple.  Lungs: tachypnea, on O2, +crackles in bases, breath sounds diminished Heart: irregularly regular rhythm, tachycardia, no murmur, rub or gallop Abdomen: soft, NT, + BS, +incisional scar on RUQ and LUQ Lower extremities: B/L BKA,  Neuro: A & O  X 3. Moves all extremities spontaneously. Psych:  Flat affect Dialysis Access: TDC,   Dialysis Orders: MWF 3.75hrs, 180N, BFR 450, DFR autoflow 1.5, EDW 45.5kg, 3K/2.25Ca, Linear Sodium modeling.    Mircera 116mcg IV q 2wks last 9/12, Calcitriol 0.72mcg qHD NO Heparin Access: R IJ TDC   Assessment: 1.   Chest pain - hx CAD/ CABG x 1.  Here w refractory CP, known new CAD as of 7/18 cath.  Medical Rx.  2.  ESRD on HD - pt is too uncomfortable for HD today, will resched for tomorrow.  3.  Hypotension on midodrine 4.  Anemia last mircera was 150 ug on 9/12 5.  MBD cont meds 6.  Liver transplant 7.  IDDM 8.  Combined s/d CHF  Plan - no HD today , pt is too ill and possibly unstable.  Will reassess in am.    [name] MD [facility] pgr [phone]   10/31/2016, 12:42 PM       Recent Labs Lab 10/29/16 1953 10/30/16 0629  NA 133* 135  K 5.8* 4.5  CL 99* 100*  CO2 26 25  GLUCOSE 151* 142*  BUN 7 9  CREATININE 2.97* 3.54*  CALCIUM 8.4* 8.6*    Recent Labs Lab 10/29/16 2115  AST 46*  ALT 19  ALKPHOS 167*  BILITOT 1.8*  PROT 6.8  ALBUMIN 1.9*    Recent Labs Lab 10/29/16 1900 10/30/16 0629  WBC 11.4* 11.7*  HGB 11.0* 10.5*  HCT 33.9* 32.2*  MCV 90.2 91.7  PLT 233 149*   Iron/TIBC/Ferritin/ %Sat    Component Value Date/Time   IRON 22 (L)  03/12/2016 1308   TIBC 181 (L) 03/12/2016 1308   FERRITIN 1,095 (H) 08/30/2012 1449   IRONPCTSAT 12 03/12/2016 1308

## 2016-10-31 NOTE — Progress Notes (Signed)
Patient had 1 episode of emesis, ate peaches earlier. Still somnolent. Have IV zofran 4 mg, will continue to monitor.

## 2016-10-31 NOTE — Progress Notes (Addendum)
PROGRESS NOTE    [name]  OZD:[id number] DOB: 1955/04/29 DOA: 10/29/2016 PCP: Clovia Cuff, MD    Brief Narrative:  61 yo female presents with chest pain. Patient is known to have end-stage renal disease, and coronary  artery disease,recent admission for chest pain. Chest pain was associated with dyspnea, and occurred after having dialysis.On physical examination, blood pressure 111/60, respirations 16, oxygen saturation 98%. Lungs were clear to auscultation, heart S1-S2 present rhythmic, abdomen was soft nontender, no lower extremity edema.sodium 135, potassium 4.5-100, bicarbonate 25, glucose 142, BUN 9, creatinine 3.54, troponin 0.61, white count 11.7, 1110.5, hematocrit 32.2, platelets 149. Chest x-ray with a small bilateral pleural effusions, EKG with left axis deviation, LVH, ST elevations on the precordial leads V1-V3.[name]  Patient admitted with a working diagnosis of atypical chest pain, rule out acute coronary syndrome.   Assessment & Plan:   Principal Problem:   Chest pain Active Problems:   Hypothyroidism   End stage renal disease on dialysis ([place])   CAD (coronary artery disease), native coronary artery   History of prosthetic aortic valve replacement   S/P liver transplant (HCC)   PAD (peripheral artery disease) (HCC)   History of liver transplant (HCC)   S/P bilateral BKA (below knee amputation) ([place])   Type 2 diabetes mellitus with peripheral neuropathy ([place])   H/O mitral valve replacement with mechanical valve   1. Atypical chest pain. Patient has continue to be chest pain free, will continue close monitoring for further symptoms, aggressive medical therapy per cardiology, no further testing for now. Patient has significant risk factors for cad.Continue aspirin, metoprolol.    2. Metabolic/ toxic encephalopathy. Will continue neuro checks per unit protocol, patient not taken to HD today, will continue neuro checks per unit protocol, avoid sedatives. Dc alprazolam.  Continue mirtazapine and trazodone for now.   3. ESRD on HD. Did not have HD today, due to encephalopathy and hypotension, plan for HD in am. Clinically not volume overloaded, or uremic, not acidotic or hyperkalemic. Continue midodrine  4. Severe calorie protein malnutrition. Will continue nutritional supplements.   5. Hypothyroid. Continue levothyroxine.   6. Liver transplant. Continue tacrolimus, noted elevated ammonia and INR, will add lactulose and will contact liver transplant coordinator at [facility]. Continue neuro checks.   Late entry: I called the [facility] consultation in regards liver transplant, mild elevated ammonia level and high INR level. Waiting response.   DVT prophylaxis: scd  Code Status: dnr Family Communication:  Patient's sister at the bedside Disposition Plan: Home   Consultants:   Cardiology  Nephrology  Procedures:     Antimicrobials:       Subjective: Patient this am very somnolent and hypotensive, not taken to HD, at the time of my examination, more awake but still not interactive, per patient's sister, patient is not at her baseline. No chest pain or dyspnea.   Objective: Vitals:   10/31/16 0508 10/31/16 0748 10/31/16 0830 10/31/16 1134  BP: 99/83 114/71  104/69  Pulse: 79     Resp: (!) 21     Temp: 97.6 F (36.4 C) (!) 97.4 F (36.3 C)  (!) 97.5 F (36.4 C)  TempSrc: Oral Axillary  Axillary  SpO2: 97% 95% 93% 100%  Weight: 47.8 kg (105 lb 6.4 oz)     Height:        Intake/Output Summary (Last 24 hours) at 10/31/16 1258 Last data filed at 10/31/16 0900  Gross per 24 hour  Intake  120 ml  Output                0 ml  Net              120 ml   Filed Weights   10/29/16 1922 10/30/16 1900 10/31/16 0508  Weight: 46.3 kg (102 lb) 47.7 kg (105 lb 3.2 oz) 47.8 kg (105 lb 6.4 oz)    Examination:  General: Not in pain or dyspnea, deconditioned and ill looking appearing Neurology: Awake and alert, non focal  E ENT: mild pallor, no  icterus, oral mucosa dry Cardiovascular: No JVD. S1-S2 present, rhythmic, no gallops, rubs, or murmurs. No lower extremity edema. Pulmonary: vesicular breath sounds bilaterally, adequate air movement, no wheezing, rhonchi or rales. Gastrointestinal. Abdomen flat, no organomegaly, non tender, no rebound or guarding Skin. No rashes Musculoskeletal: bilateral amputation lower extremities.      Data Reviewed: I have personally reviewed following labs and imaging studies  CBC:  Recent Labs Lab 10/29/16 1900 10/30/16 0629  WBC 11.4* 11.7*  HGB 11.0* 10.5*  HCT 33.9* 32.2*  MCV 90.2 91.7  PLT 233 381*   Basic Metabolic Panel:  Recent Labs Lab 10/29/16 1953 10/30/16 0629  NA 133* 135  K 5.8* 4.5  CL 99* 100*  CO2 26 25  GLUCOSE 151* 142*  BUN 7 9  CREATININE 2.97* 3.54*  CALCIUM 8.4* 8.6*   GFR: Estimated Creatinine Clearance: 12.4 mL/min (A) (by C-G formula based on SCr of 3.54 mg/dL (H)). Liver Function Tests:  Recent Labs Lab 10/29/16 2115  AST 46*  ALT 19  ALKPHOS 167*  BILITOT 1.8*  PROT 6.8  ALBUMIN 1.9*   No results for input(s): LIPASE, AMYLASE in the last 168 hours.  Recent Labs Lab 10/29/16 2115  AMMONIA 42*   Coagulation Profile:  Recent Labs Lab 10/27/16 10/29/16 2115 10/30/16 0629 10/31/16 0335  INR 6.0* 3.69 3.74 4.32*   Cardiac Enzymes:  Recent Labs Lab 10/30/16 0056 10/30/16 0629 10/30/16 1148  TROPONINI 0.61* 0.61* 0.67*   BNP (last 3 results) No results for input(s): PROBNP in the last 8760 hours. HbA1C: No results for input(s): HGBA1C in the last 72 hours. CBG: No results for input(s): GLUCAP in the last 168 hours. Lipid Profile: No results for input(s): CHOL, HDL, LDLCALC, TRIG, CHOLHDL, LDLDIRECT in the last 72 hours. Thyroid Function Tests: No results for input(s): TSH, T4TOTAL, FREET4, T3FREE, THYROIDAB in the last 72 hours. Anemia Panel: No results for input(s): VITAMINB12, FOLATE, FERRITIN, TIBC, IRON,  RETICCTPCT in the last 72 hours.    Radiology Studies: I have reviewed all of the imaging during this hospital visit personally     Scheduled Meds: . aspirin EC  81 mg Oral Q1200  . calcitRIOL  0.5 mcg Oral Q M,W,F-HD  . calcium acetate  667 mg Oral TID WC  . calcium carbonate  1 tablet Oral QAC supper  . feeding supplement  1 Container Oral TID BM  . feeding supplement (PRO-STAT SUGAR FREE 64)  30 mL Oral BID  . gabapentin  300 mg Oral QHS  . Influenza vac split quadrivalent PF  0.5 mL Intramuscular Tomorrow-1000  . levothyroxine  200 mcg Oral QAC breakfast  . lidocaine  1 patch Transdermal Q24H  . metoprolol tartrate  12.5 mg Oral BID  . midodrine  10 mg Oral TID WC  . mirtazapine  15 mg Oral QHS  . multivitamin  1 tablet Oral QHS  . tacrolimus  2 mg Oral BID  .  traZODone  50 mg Oral QHS   Continuous Infusions:   LOS: 0 days        Mauricio Gerome Apley, MD [facility] Pager [phone]

## 2016-10-31 NOTE — Consult Note (Signed)
Cardiology Consultation:   Patient ID: [name]; [id number]; Oct 31, 1955   Admit date: 10/29/2016 Date of Consult: 10/31/2016  Primary Care Provider: Clovia Cuff, MD Primary Cardiologist: Dr. [name]    Patient Profile:   [name] is a 61 y.o. female with a hx of coronary artery disease status post bypass, LIMA to LAD, mechanical aortic valve, bilateral BKA, multiple failed HD access, history of hepatitis C with liver transplant in 2011, COPD, below-knee amputations as well, depression, end-stage renal disease MWF who is being seen today for the evaluation of chest pain at the request of Dr. Cathlean Sauer.  At last catheterization in July 2018 she was found to have a patent LIMA to LAD, but had significant LAD and diagonal disease  proximal to the graft insertion, new occlusion of her OM2 and OM3 branches of her circumflex, and distal RCA disease involving the PDA and inferior LV branch of 90 and 80%.  This has been treated medically.  She has continued to have difficulty with hypotension, particularly on dialysis days and as result, has not been able to be titrated with medical therapy for ischemia.   2 admission in past month for NSTEMI. Recommended medical management by her primary cardiologist Dr. [name]. With her end-stage renal disease, she is not be a candidate for ranolazine however some reason she was discharge on Ranolazine 500mg  by primary team.   History of Present Illness:   [name] presented to chest pain after having dialysis 9/19. She had persistent chest pain with pressure like radiating to her back. Complains of fatigue and weakness. EKG showed sinus rhythm with ST depression in lateral leads- personally reviewed. Troponin flat trend 0.61-->0.61-->0.67. INR 4.32. Scr 3.54. Hypotensive during dialysis.   Past Medical History:  Diagnosis Date  . Anemia    takes Folic Acid daily  . Anxiety   . Arthritis    "left hand, back" (08/30/2012)  . Asthma   . CAD (coronary  artery disease) Jan. 2015   Cath: 20% LAD, 50% D1; s/p LIMA-LAD  . Chest pain 03/06/2015  . CHF (congestive heart failure) ([place]) 05/2016  . Chronic back pain   . Chronic constipation    takes MIralax and Colace daily  . COPD (chronic obstructive pulmonary disease) ([place])   . Depression    takes Cymbalta for "severe" depression  . ESRD on dialysis [facility])    "MWF; [facility]" (08/12/2016)  . GERD (gastroesophageal reflux disease)    takes Omeprazole daily  . Headache    "at least monthly" (08/30/2012)  . Hepatitis C   . History of blood transfusion    "several" (08/30/2012)  . Hypertension    takes Metoprolol and Lisinopril daily, sees Dr Bea Graff  . Hypothyroidism    takes Synthroid daily  . Migraine    "last migraine was in 2013" (08/30/2012)  . Myocardial infarction ([place])   . Neuromuscular disorder ([place])    carpal tunnel in right hand  . Obesity   . Peripheral vascular disease (HCC) hands and legs  . Pneumonia    "today and several times before" (08/30/2012)  . 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 S/P bilateral BKA (below knee amputation) ([place]) 04/30/2016  . 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 SVT (supraventricular tachycardia) ([place]) 06/09/14  . Tobacco abuse   . Type II diabetes mellitus ([place])  Levemir 2units daily if > 150    Past Surgical History:  Procedure Laterality Date  . ABDOMINAL AORTOGRAM W/LOWER EXTREMITY N/A 07/04/2016   Procedure: Abdominal Aortogram w/Lower Extremity;  Surgeon: Elam Dutch, MD;  Location: [place] CV LAB;  Service: Cardiovascular;  Laterality: N/A;  . AMPUTATION Left 04/24/2016   Procedure: AMPUTATION BELOW KNEE;  Surgeon: Angelia Mould, MD;  Location: [place];  Service: Vascular;  Laterality: Left;  . AMPUTATION Right 07/08/2016   Procedure: AMPUTATION BELOW KNEE;  Surgeon: Angelia Mould, MD;  Location: [place];  Service:  Vascular;  Laterality: Right;  . AORTIC VALVE REPLACEMENT N/A 03/15/2013   AVR; Surgeon: Ivin Poot, MD;  Location: [facility] OR; Open Heart Surgery;  [employer] mechanical prosthesis, top hat valve  . ARTERIOVENOUS GRAFT PLACEMENT Left 10/03/10    forearm  . AV FISTULA PLACEMENT  01/29/2011   Procedure: INSERTION OF ARTERIOVENOUS (AV) GORE-TEX GRAFT ARM;  Surgeon: Elam Dutch, MD;  Location: [facility] OR;  Service: Vascular;  Laterality: Right;  . AV FISTULA PLACEMENT  03/10/2011   Procedure: INSERTION OF ARTERIOVENOUS (AV) GORE-TEX GRAFT THIGH;  Surgeon: Elam Dutch, MD;  Location: [place];  Service: Vascular;  Laterality: Left;  . AV FISTULA PLACEMENT Left 07/21/2016   Procedure: CREATION OF LEFT UPPER ARM ARTERIOVENOUS (AV) FISTULA;  Surgeon: Conrad Moore, MD;  Location: [place];  Service: Vascular;  Laterality: Left;  . [place] REMOVAL  12/23/2010   Procedure: REMOVAL OF ARTERIOVENOUS GORETEX GRAFT ([place]);  Surgeon: Elam Dutch, MD;  Location: [place];  Service: Vascular;  Laterality: Left;  procedure started @1736 -1852  . CHOLECYSTECTOMY  1993  . CORONARY ANGIOGRAPHY N/A 08/12/2016   Procedure: CORONARY ANGIOGRAPHY (CATH LAB);  Surgeon: Leonie Man, MD;  Location: [place] CV LAB;  Service: Cardiovascular;  Laterality: N/A;  . CORONARY ARTERY BYPASS GRAFT N/A 03/15/2013   Procedure: CORONARY ARTERY BYPASS GRAFTING (CABG) times one using left internal mammary artery.;  Surgeon: Ivin Poot, MD;  Location: [place];  Service: Open Heart Surgery;  Laterality: N/A;  POSS CABG X 1  . CYSTOSCOPY  1990's  . INSERTION OF DIALYSIS CATHETER  12/23/2010   Procedure: INSERTION OF DIALYSIS CATHETER;  Surgeon: Elam Dutch, MD;  Location: [place];  Service: Vascular;  Laterality: Right;  Right Internal Jugular 28cm dialysis catheter insertion procedure time 1701-1720   . INSERTION OF DIALYSIS CATHETER Right 03/11/2016   Procedure: INSERTION OF DIALYSIS CATHETER;  Surgeon: Angelia Mould,  MD;  Location: [place];  Service: Vascular;  Laterality: Right;  . INTRAOPERATIVE TRANSESOPHAGEAL ECHOCARDIOGRAM N/A 03/15/2013   Procedure: INTRAOPERATIVE TRANSESOPHAGEAL ECHOCARDIOGRAM;  Surgeon: Ivin Poot, MD;  Location: [place];  Service: Open Heart Surgery;  Laterality: N/A;  . LEFT HEART CATHETERIZATION WITH CORONARY ANGIOGRAM N/A 07/29/2012   Procedure: LEFT HEART CATHETERIZATION WITH CORONARY ANGIOGRAM;  Surgeon: Troy Sine, MD;  Location: [facility] CATH LAB;  Service: Cardiovascular;  Laterality: N/A;  . LEFT HEART CATHETERIZATION WITH CORONARY ANGIOGRAM N/A 03/10/2013   Procedure: LEFT HEART CATHETERIZATION WITH CORONARY ANGIOGRAM;  Surgeon: Troy Sine, MD;  Location: [facility] CATH LAB;  Service: Cardiovascular;  Laterality: N/A;  . LEFT HEART CATHETERIZATION WITH CORONARY/GRAFT ANGIOGRAM N/A 12/24/2011   Procedure: LEFT HEART CATHETERIZATION WITH Beatrix Fetters;  Surgeon: [name], MD;  Location: [facility] CATH LAB;  Service: Cardiovascular;  Laterality: N/A;  . LEFT HEART CATHETERIZATION WITH CORONARY/GRAFT ANGIOGRAM N/A 12/16/2013   Procedure: LEFT HEART CATHETERIZATION WITH CORONARY/GRAFT ANGIOGRAM;  Surgeon: Troy Sine, MD;  Location: [facility] CATH LAB;  Service: Cardiovascular;  Laterality: N/A;  . left kidney removed    . LIGATION ARTERIOVENOUS GORTEX GRAFT Left 03/11/2016   Procedure: LIGATION THIGH ARTERIOVENOUS GORTEX GRAFT;  Surgeon: Angelia Mould, MD;  Location: [place];  Service: Vascular;  Laterality: Left;  . LIGATION OF ARTERIOVENOUS  FISTULA Left 08/11/2016   Procedure: LIGATION OF ARTERIOVENOUS  FISTULA;  Surgeon: Elam Dutch, MD;  Location: [place];  Service: Vascular;  Laterality: Left;  . LIVER TRANSPLANT  10/25/2009   sees Dr Ferol Luz 1 every 6 months, saw last in Dec 2013. [name] Coord [phone]  . PERIPHERAL VASCULAR CATHETERIZATION N/A 11/06/2015   Procedure: Abdominal Aortogram;  Surgeon: Serafina Mitchell, MD;  Location: [place] CV LAB;   Service: Cardiovascular;  Laterality: N/A;  . PERIPHERAL VASCULAR CATHETERIZATION N/A 11/06/2015   Procedure: Lower Extremity Angiography;  Surgeon: Serafina Mitchell, MD;  Location: [place] CV LAB;  Service: Cardiovascular;  Laterality: N/A;  . PERIPHERAL VASCULAR CATHETERIZATION  11/06/2015   Procedure: Peripheral Vascular Atherectomy;  Surgeon: Serafina Mitchell, MD;  Location: [place] CV LAB;  Service: Cardiovascular;;  Left Superficial femoral  . SHUNTOGRAM Left 05/15/2014   Procedure: SHUNTOGRAM;  Surgeon: Conrad Bad Axe, MD;  Location: [facility] CATH LAB;  Service: Cardiovascular;  Laterality: Left;  . SMALL INTESTINE SURGERY  90's  . SPINAL GROWTH RODS  2010   "put 2 metal rods in my back; they had detetriorated" (08/30/2012)  . THROMBECTOMY    . THROMBECTOMY AND REVISION OF ARTERIOVENTOUS (AV) GORETEX  GRAFT Left 03/30/2014  . THROMBECTOMY AND REVISION OF ARTERIOVENTOUS (AV) GORETEX  GRAFT Left 03/30/2014   Procedure: THROMBECTOMY AND REVISION OF ARTERIOVENTOUS (AV) GORETEX  GRAFT;  Surgeon: Conrad [place] Lakes, MD;  Location: [place];  Service: Vascular;  Laterality: Left;  . TUBAL LIGATION  1990's   Inpatient Medications: Scheduled Meds: . aspirin EC  81 mg Oral Q1200  . calcitRIOL  0.5 mcg Oral Q M,W,F-HD  . calcium acetate  667 mg Oral TID WC  . calcium carbonate  1 tablet Oral QAC supper  . feeding supplement  1 Container Oral TID BM  . feeding supplement (PRO-STAT SUGAR FREE 64)  30 mL Oral BID  . gabapentin  300 mg Oral QHS  . Influenza vac split quadrivalent PF  0.5 mL Intramuscular [place]-1000  . levothyroxine  200 mcg Oral QAC breakfast  . lidocaine  1 patch Transdermal Q24H  . metoprolol tartrate  12.5 mg Oral BID  . midodrine  10 mg Oral TID WC  . mirtazapine  15 mg Oral QHS  . multivitamin  1 tablet Oral QHS  . tacrolimus  2 mg Oral BID  . traZODone  50 mg Oral QHS   Continuous Infusions:  PRN Meds: ALPRAZolam, methocarbamol, metoCLOPramide, nitroGLYCERIN, ondansetron **OR**  ondansetron (ZOFRAN) IV, oxyCODONE, promethazine, traMADol  Allergies:    Allergies  Allergen Reactions  . Acetaminophen Other (See Comments)    Liver transplant recipient   . Morphine And Related Itching  . Codeine Itching  . Mirtazapine Other (See Comments)    hallucination    Social History:   Social History   Social History  . Marital status: Divorced    Spouse name: N/A  . Number of children: N/A  . Years of education: N/A   Occupational History  . disabled    Social History Main Topics  . Smoking status: Current Every Day Smoker  Packs/day: 0.25    Years: 43.00    Types: Cigarettes  . Smokeless tobacco: Never Used     Comment: 8/18 - down to <0.5 ppd  . Alcohol use No  . Drug use: No  . Sexual activity: No   Other Topics Concern  . Not on file   Social History Narrative  . No narrative on file    Family History:    Family History  Problem Relation Age of Onset  . Cancer Mother   . Diabetes Mother   . Hypertension Mother   . Stroke Mother   . Cancer Father   . Anesthesia problems Neg Hx   . Hypotension Neg Hx   . Malignant hyperthermia Neg Hx   . Pseudochol deficiency Neg Hx      ROS:  Please see the history of present illness.  ROS All other ROS reviewed and negative.     Physical Exam/Data:   Vitals:   10/30/16 2128 10/30/16 2300 10/31/16 0508 10/31/16 0748  BP: 94/63 95/66 99/83  114/71  Pulse:  74 79   Resp:  (!) 23 (!) 21   Temp:   97.6 F (36.4 C) (!) 97.4 F (36.3 C)  TempSrc:   Oral Axillary  SpO2:  99% 97% 95%  Weight:   105 lb 6.4 oz (47.8 kg)   Height:        Intake/Output Summary (Last 24 hours) at 10/31/16 0900 Last data filed at 10/31/16 0500  Gross per 24 hour  Intake                0 ml  Output                0 ml  Net                0 ml   Filed Weights   10/29/16 1922 10/30/16 1900 10/31/16 0508  Weight: 102 lb (46.3 kg) 105 lb 3.2 oz (47.7 kg) 105 lb 6.4 oz (47.8 kg)   Body mass index is 18.09 kg/m.    General:  Ill appearing female in no acute distress HEENT: normal Lymph: no adenopathy Neck: no JVD Endocrine:  No thryomegaly Vascular: No carotid bruits; FA pulses 2+ bilaterally without bruits  Cardiac:  normal S1, S2; RRR; no murmur Lungs:  clear to auscultation bilaterally, no wheezing, rhonchi or rales  Abd: soft, nontender, no hepatomegaly  Ext: Bilateral BKA Musculoskeletal:  No deformities, BUE and BLE strength normal and equal Skin: warm and dry  Neuro:  CNs 2-12 intact, no focal abnormalities noted Psych:Lethergic    Relevant CV Studies:   Echo 09/30/16 Study Conclusions  - Left ventricle: The cavity size was normal. Wall thickness was   normal. Anterolateral and anterior hypokinesis. Inferolateral   akinesis. There was an area of mid septal akinesis and mid   inferior akinesis. Systolic function was moderately to severely   reduced. The estimated ejection fraction was in the range of 30%   to 35%. Features are consistent with a pseudonormal left   ventricular filling pattern, with concomitant abnormal relaxation   and increased filling pressure (grade 2 diastolic dysfunction). - Aortic valve: There was a mechanical prosthetic aortic valve. No   significant prosthetic valve stenosis. There was mild   regurgitation. Mean gradient (S): 10 mm Hg. - Mitral valve: Moderately calcified annulus. Moderately calcified   leaflets . The findings are consistent with mild stenosis. There   was mild regurgitation. Mean gradient (D): 4 mm Hg.  Valve area by   pressure half-time: 2.59 cm^2. - Left atrium: The atrium was moderately dilated. - Right ventricle: The cavity size was normal. Systolic function   was mildly reduced. - Pulmonary arteries: No complete TR doppler jet so unable to   estimate PA systolic pressure. - Inferior vena cava: The vessel was normal in size. The   respirophasic diameter changes were in the normal range (= 50%),   consistent with normal central  venous pressure.  Impressions:  - Normal LV size with EF 30-35%. Wall motion abnormalities   suggestive of multivessel CAD. Moderate diastolic dysfunction.   normal RV size with mildly decreased systolic function.   Mechanical aortic valve with mild perivalvular regurgitation.   Mild mitral stenosis and mitral regurgitation. .   Cath 08/12/16 CORONARY ANGIOGRAPHY (CATH LAB)  Conclusion     Ost LAD lesion, 50 %stenosed. Mid LAD lesion, 70 %stenosed.  Dist LAD lesion, 99 %stenosed at Graft Insertion Site.  1st Diag lesion, 90 %stenosed.  LIMA-dLAD graft was visualized by angiography and is normal in caliber and anatomically normal with faint competetive flow  Ost Cx lesion, 55 %stenosed.  2nd Mrg lesion, 100 %stenosed. Lat 2nd Mrg lesion, 90 %stenosed.  Ost 3rd Mrg lesion, 100 %stenosed.  Ost RCA lesion, 30 %stenosed.  Dist RCA lesion, 30 %stenosed.  Ost RPDA lesion, 90 %stenosed.  2nd RPLB lesion, 80 %stenosed.   The patient has new CTO of OM2 & OM3 as well as ostial-proximal 80-90% lesions in both the rPDA & rPL2. The CTO vessels are likely not acute as the Troponin levels are not significantly elevated. The one potential PCI target would be ostial rPDA, but with the distal RCA calcified lesion, this would be complicated & would require jailing of the major Right Post-AV Groove Branch with 3 RPL branches.  In the absence of ongoing, active chest pain, reviewing images with Dr. Irish Lack, we both agreed that the best option is to optimize medical management and volume control as she is essentially immobilized in a wheelchair. If symptoms warrant, with ongoing active chest pain, we could consider difficult PCI of the RPDA. This would be a relatively small caliber stent  Plan: Return to nursing unit for ongoing care. Restart IV heparin and warfarin. Optimize medical management and volume control.    Laboratory Data:  Chemistry Recent Labs Lab 10/29/16 1953  10/30/16 0629  NA 133* 135  K 5.8* 4.5  CL 99* 100*  CO2 26 25  GLUCOSE 151* 142*  BUN 7 9  CREATININE 2.97* 3.54*  CALCIUM 8.4* 8.6*  GFRNONAA 16* 13*  GFRAA 18* 15*  ANIONGAP 8 10     Recent Labs Lab 10/29/16 2115  PROT 6.8  ALBUMIN 1.9*  AST 46*  ALT 19  ALKPHOS 167*  BILITOT 1.8*   Hematology Recent Labs Lab 10/29/16 1900 10/30/16 0629  WBC 11.4* 11.7*  RBC 3.76* 3.51*  HGB 11.0* 10.5*  HCT 33.9* 32.2*  MCV 90.2 91.7  MCH 29.3 29.9  MCHC 32.4 32.6  RDW 19.7* 19.5*  PLT 233 149*   Cardiac Enzymes Recent Labs Lab 10/30/16 0056 10/30/16 0629 10/30/16 1148  TROPONINI 0.61* 0.61* 0.67*    Recent Labs Lab 10/29/16 1908  TROPIPOC 0.54*    Radiology/Studies:  Dg Chest 2 View  Result Date: 10/29/2016 CLINICAL DATA:  Chest pain and shortness of breath EXAM: CHEST  2 VIEW COMPARISON:  10/06/2016 FINDINGS: Post sternotomy changes and postsurgical changes in the upper abdomen. Right-sided central venous catheter tip  overlies the proximal to mid right atrium. There are small bilateral effusions. There is mild cardiomegaly with central vascular congestion and mild interstitial edema. IMPRESSION: 1. Cardiomegaly with central vascular congestion and increased interstitial edema. Slight increased small bilateral effusions. Electronically Signed   By: Donavan Foil M.D.   On: 10/29/2016 20:23    Assessment and Plan:   1. Chest pain with hx of CAD s/p CABG - in setting of dialysis and possible hypotension. Troponin flat trend, trending down from prior admission. EKG similar to prior. Her primary cardiologist Dr. [name] has reccommended medical management. Will discontinue Renexa per Dr. Evette Georges recommendation as it can cause abnormal level in body. Discussed with pharmacist.  - Limited medical management due to hypotension.   2. Chronic combined CHF - Volume status managed by HD.   3. ESRD on HD MWF   For questions or updates, please contact [place] Please consult [email or web address] for contact info under Cardiology/STEMI.   Signed, Leanor Kail, PA  10/31/2016 9:00 AM   Personally seen and examined. Agree with above.  Laying in bed, appears comfortable but fairly nonconversant.  Exam, somnolent but arousable. Heart regular rate and rhythm with no appreciable murmurs, lungs are clear to auscultation abdomen soft, bilateral BKA's noted  61 year old female with severe native coronary artery disease status post prior bypass surgery with recurrent episodes of angina during hemodialysis.Troponin elevation noted.   - We will stop her ranolazine/Ranexa given that she is on hemodialysis and this may pose undue risk pharmacologically.  - Continue with low-dose metoprolol, limited bysignificant hypotension transiently especially during hemodialysis.  - Continuing with midodrine.  - She just recently had cardiac catheterization in July reviewed as above. Continuation with medical management.  - unfortunately, she is likely to continue to have ongoing anginal symptoms during hemodialysis and there is no good solution other than continuing to try low-dose beta blocker.  Isosorbide could be once again tried however blood pressure was a rate limitingstep in this process.  - troponin elevation is flat at 0.6 This is down from her previous admission. This does portending an increased cardiovascular risk/mortality/morbidity.  Please let us know if he can be of further assistance.  Candee Furbish, MD

## 2016-10-31 NOTE — Progress Notes (Signed)
Initial Nutrition Assessment  DOCUMENTATION CODES:   Severe malnutrition in context of chronic illness  INTERVENTION:   Continue:  Boost Breeze po TID, each supplement provides 250 kcal and 9 grams of protein  Pro-stat 30 ml PO BID, each supplement provides 100 kcal and 15 gm protein  NUTRITION DIAGNOSIS:   Malnutrition (severe) related to chronic illness (ESRD, CHF, CAD) as evidenced by severe depletion of muscle mass, percent weight loss (>/= 12.5% weight loss within 6 months).  GOAL:   Patient will meet greater than or equal to 90% of their needs  MONITOR:   PO intake, Supplement acceptance, Skin, Labs, I & O's  REASON FOR ASSESSMENT:   Malnutrition Screening Tool    ASSESSMENT:   61 yo female with PMH of liver transplant, PVD, anxiety, asthma, GERD, anemia, hypothyroidism, neuromuscular D/O, HTN, COPD, PNA, DM-2, CAD, CHF, ESRD on HD, bilateral BKAs who was admitted on 9/19 with chest pain.  Patient seemed very lethargic during RD visit. Sister at bedside. Patient drank Ensure Clear PTA, agreed to receive Boost Breeze here. Patient has been eating poorly and thinks she has lost a lot of weight. Per review of weights, patient's weight fluctuates (likely related to fluid status & HD). Overall weight has declined by at least 12.5% in the past 6 months.  Nutrition-Focused physical exam completed. Findings are mild-moderate fat depletion, mild-moderate and severe muscle depletion, and unable to assess edema.   Labs reviewed. Medications reviewed and include Phoslo, TUMS, Rena-vit, Remeron.  Diet Order:  Diet renal with fluid restriction Fluid restriction: 1200 mL Fluid; Room service appropriate? Yes; Fluid consistency: Thin  Skin:  Wound (see comment) (stage II coccyx, stage I perineum)  Last BM:  9/14  Height:   Ht Readings from Last 1 Encounters:  10/29/16 5\' 4"  (1.626 m)    Weight:   Wt Readings from Last 1 Encounters:  10/31/16 105 lb 6.4 oz (47.8 kg)     Ideal Body Weight:  47.5 kg  BMI:  20.8 (adjusted for bilateral BKAs)  Estimated Nutritional Needs:   Kcal:  1425-1650  Protein:  70-85 gm  Fluid:  1.2 L  EDUCATION NEEDS:   No education needs identified at this time  Molli Barrows, [place], [place], [place] Pager [phone] After Hours Pager [phone]

## 2016-10-31 NOTE — Plan of Care (Signed)
Problem: Physical Regulation: Goal: Ability to maintain clinical measurements within normal limits will improve Outcome: Not Progressing Pt has stated LBM was last Friday 9/14. Bowel sounds hypoactive, abdomen non-distended and non tender. Ammonia level 42. Lactulose and dulcolax suppository added to plan of care today. No BM as of yet. Will continue to monitor.

## 2016-10-31 NOTE — Progress Notes (Signed)
CRITICAL VALUE ALERT  Critical Value:  INR 4.32  Date & Time Notied:  10/31/2016 5277  Provider Notified: Pharmacy notified.   Orders Received/Actions taken: Coumadin already on hold. INR increased from previous lab reading while on hold. No bleeding noted. Per pharmacist: If bleeding noted, pharmacy needs to be notified. Continue to monitor.  Jacqlyn Larsen, RN

## 2016-10-31 NOTE — Progress Notes (Signed)
[place] for warfarin  Indication: mechanical aortic valve   Allergies  Allergen Reactions  . Acetaminophen Other (See Comments)    Liver transplant recipient   . Morphine And Related Itching  . Codeine Itching  . Mirtazapine Other (See Comments)    hallucination    Patient Measurements: Height: 5\' 4"  (162.6 cm) Weight: 105 lb 6.4 oz (47.8 kg) IBW/kg (Calculated) : 54.7  Vital Signs: Temp: 97.4 F (36.3 C) (09/21 0748) Temp Source: Axillary (09/21 0748) BP: 114/71 (09/21 0748) Pulse Rate: 79 (09/21 0508)  Labs:  Recent Labs  10/29/16 1900 10/29/16 1953 10/29/16 2115 10/30/16 0056 10/30/16 0629 10/30/16 1148 10/31/16 0335  HGB 11.0*  --   --   --  10.5*  --   --   HCT 33.9*  --   --   --  32.2*  --   --   PLT 233  --   --   --  149*  --   --   LABPROT  --   --  36.3*  --  36.7*  --  41.1*  INR  --   --  3.69  --  3.74  --  4.32*  CREATININE  --  2.97*  --   --  3.54*  --   --   TROPONINI  --   --   --  0.61* 0.61* 0.67*  --     Estimated Creatinine Clearance: 12.4 mL/min (A) (by C-G formula based on SCr of 3.54 mg/dL (H)).   Medical History: Past Medical History:  Diagnosis Date  . Anemia    takes Folic Acid daily  . Anxiety   . Arthritis    "left hand, back" (08/30/2012)  . Asthma   . CAD (coronary artery disease) Jan. 2015   Cath: 20% LAD, 50% D1; s/p LIMA-LAD  . Chest pain 03/06/2015  . CHF (congestive heart failure) ([place]) 05/2016  . Chronic back pain   . Chronic constipation    takes MIralax and Colace daily  . COPD (chronic obstructive pulmonary disease) ([place])   . Depression    takes Cymbalta for "severe" depression  . ESRD on dialysis [facility])    "MWF; [facility]" (08/12/2016)  . GERD (gastroesophageal reflux disease)    takes Omeprazole daily  . Headache    "at least monthly" (08/30/2012)  . Hepatitis C   . History of blood transfusion    "several" (08/30/2012)  . Hypertension    takes Metoprolol and  Lisinopril daily, sees Dr Bea Graff  . Hypothyroidism    takes Synthroid daily  . Migraine    "last migraine was in 2013" (08/30/2012)  . Myocardial infarction ([place])   . Neuromuscular disorder ([place])    carpal tunnel in right hand  . Obesity   . Peripheral vascular disease (HCC) hands and legs  . Pneumonia    "today and several times before" (08/30/2012)  . 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 S/P bilateral BKA (below knee amputation) ([place]) 04/30/2016  . 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 SVT (supraventricular tachycardia) ([place]) 06/09/14  . Tobacco abuse   . Type II diabetes mellitus (HCC)    Levemir 2units daily if > 150    Assessment: 61 yo female admitted with chest pain. Patient on warfarin PTA for mechanical aortic valve.  -INR on admission 3.69 now  up to 4.32  Per [facility] visit on 9/18, patient INR was 6. Patient instructed to hold warfarin Tues/Wed/Thurs and resume with dose of 2mg  on all days, except 1mg  on MonFri.   Goal of Therapy:  INR 2.5-3.5    Plan:  Hold warfarin tonight Daily INR   Hildred Laser, Pharm D 10/31/2016 11:15 AM

## 2016-10-31 NOTE — Progress Notes (Addendum)
Patient somnolent but arousable. When patient is talking, she can fall asleep mid-sentence but wake up again. Told me she had some trouble breathing. Placed oxygen 2lpm nasal canula. Said that made her feel better and repositioned in bed. Patient was given Xanax 0.5 mg tablet at 0724 today. Patient is alert and oriented x4. Sister at bedside states this is not her baseline. Patient is not in distress and sleeping. Vitals WNL. Paged MD Arrien, will continue to monitor.

## 2016-11-01 LAB — BASIC METABOLIC PANEL
ANION GAP: 11 (ref 5–15)
BUN: 25 mg/dL — ABNORMAL HIGH (ref 6–20)
CALCIUM: 9 mg/dL (ref 8.9–10.3)
CO2: 26 mmol/L (ref 22–32)
CREATININE: 6.16 mg/dL — AB (ref 0.44–1.00)
Chloride: 98 mmol/L — ABNORMAL LOW (ref 101–111)
GFR, EST AFRICAN AMERICAN: 8 mL/min — AB (ref >60)
GFR, EST NON AFRICAN AMERICAN: 7 mL/min — AB (ref >60)
Glucose, Bld: 200 mg/dL — ABNORMAL HIGH (ref 65–99)
Potassium: 4.7 mmol/L (ref 3.5–5.1)
Sodium: 135 mmol/L (ref 135–145)

## 2016-11-01 LAB — CBC WITH DIFFERENTIAL/PLATELET
BASOS ABS: 0 10*3/uL (ref 0.0–0.1)
BASOS PCT: 0 %
EOS ABS: 0 10*3/uL (ref 0.0–0.7)
Eosinophils Relative: 0 %
HCT: 32.8 % — ABNORMAL LOW (ref 36.0–46.0)
HEMOGLOBIN: 10.6 g/dL — AB (ref 12.0–15.0)
Lymphocytes Relative: 11 %
Lymphs Abs: 1.1 10*3/uL (ref 0.7–4.0)
MCH: 29.5 pg (ref 26.0–34.0)
MCHC: 32.3 g/dL (ref 30.0–36.0)
MCV: 91.4 fL (ref 78.0–100.0)
MONO ABS: 1.5 10*3/uL — AB (ref 0.1–1.0)
MONOS PCT: 14 %
NEUTROS PCT: 75 %
Neutro Abs: 7.9 10*3/uL — ABNORMAL HIGH (ref 1.7–7.7)
Platelets: 140 10*3/uL — ABNORMAL LOW (ref 150–400)
RBC: 3.59 MIL/uL — ABNORMAL LOW (ref 3.87–5.11)
RDW: 18.8 % — AB (ref 11.5–15.5)
WBC: 10.5 10*3/uL (ref 4.0–10.5)

## 2016-11-01 LAB — PROTIME-INR
INR: 4.32 — AB
PROTHROMBIN TIME: 41.1 s — AB (ref 11.4–15.2)

## 2016-11-01 MED ORDER — LACTULOSE 10 GM/15ML PO SOLN: 30.0000 g | Freq: Three times a day (TID) | ORAL | 0 refills | Status: AC

## 2016-11-01 NOTE — Progress Notes (Signed)
[facility] Progress Note  Subjective: chest pain better, in better spirits, on HD now  Vitals:   11/01/16 1100 11/01/16 1105 11/01/16 1136 11/01/16 1145  BP: (!) 115/53 (!) 104/57 93/66   Pulse: 75 78    Resp: (!) 22 20  (!) 28  Temp:  97.6 F (36.4 C)    TempSrc:  Axillary    SpO2:  100%  99%  Weight:  45.3 kg (99 lb 13.9 oz)    Height:        Inpatient medications: . aspirin EC  81 mg Oral Q1200  . calcitRIOL  0.5 mcg Oral Q M,W,F-HD  . calcium acetate  667 mg Oral TID WC  . calcium carbonate  1 tablet Oral QAC supper  . feeding supplement  1 Container Oral TID BM  . feeding supplement (PRO-STAT SUGAR FREE 64)  30 mL Oral BID  . gabapentin  300 mg Oral QHS  . Influenza vac split quadrivalent PF  0.5 mL Intramuscular Tomorrow-1000  . lactulose  30 g Oral TID  . levothyroxine  200 mcg Oral QAC breakfast  . lidocaine  1 patch Transdermal Q24H  . metoprolol tartrate  12.5 mg Oral BID  . midodrine  10 mg Oral TID WC  . mirtazapine  15 mg Oral QHS  . multivitamin  1 tablet Oral QHS  . tacrolimus  2 mg Oral BID  . traZODone  50 mg Oral QHS    methocarbamol, metoCLOPramide, nitroGLYCERIN, ondansetron **OR** ondansetron (ZOFRAN) IV, oxyCODONE, promethazine, traMADol  Exam: General: ill appearing, cachexic, NAD Head: NCAT, sclera not icteric, MMM Neck: Supple.  Lungs: faint basilar crackles, o/w clear Heart: irregularly regular rhythm, tachycardia, no murmur, rub or gallop Abdomen: soft, NT, + BS, +incisional scar on RUQ and LUQ Lower extremities: B/L BKA,  Neuro: A & O  X 3. Moves all extremities spontaneously. Psych:  Flat affect Dialysis Access: TDC,   Dialysis: MWF 3.75hrs, 180N, BFR 450, DFR autoflow 1.5, EDW 45.5kg, 3K/2.25Ca, Linear Sodium modeling.   -Mircera 137mcg IV q 2wks last 9/12, Calcitriol 0.92mcg qHD -NO Heparin -Access: R IJ TDC   Assessment: 1.  Chest pain - hx CAD/ CABG x 1 LIMA-LAD in 2015.  Hx new native artery CAD as of 7/18  cath involving branches of LCX/ RCA.  Medical Rx per cardiology.  2.  ESRD HD MWF.  Plan HD today off schedule  3.  Hypotension on midodrine 4.  Anemia last mircera was 150 ug on 9/12 5.  MBD cont meds 6.  Liver transplant 7.  IDDM 8.  Combined s/d CHF - on low dose metop 9.  Bilat BKA - march '18 10.  DNR  Plan - HD today, for dc after HD    [name] MD [facility] pgr [phone]   11/01/2016, 12:23 PM       Recent Labs Lab 10/29/16 1953 10/30/16 0629 11/01/16 0717  NA 133* 135 135  K 5.8* 4.5 4.7  CL 99* 100* 98*  CO2 26 25 26   GLUCOSE 151* 142* 200*  BUN 7 9 25*  CREATININE 2.97* 3.54* 6.16*  CALCIUM 8.4* 8.6* 9.0    Recent Labs Lab 10/29/16 2115  AST 46*  ALT 19  ALKPHOS 167*  BILITOT 1.8*  PROT 6.8  ALBUMIN 1.9*    Recent Labs Lab 10/29/16 1900 10/30/16 0629 11/01/16 0717  WBC 11.4* 11.7* 10.5  NEUTROABS  --   --  7.9*  HGB 11.0* 10.5* 10.6*  HCT  33.9* 32.2* 32.8*  MCV 90.2 91.7 91.4  PLT 233 149* 140*   Iron/TIBC/Ferritin/ %Sat    Component Value Date/Time   IRON 22 (L) 03/12/2016 1308   TIBC 181 (L) 03/12/2016 1308   FERRITIN 1,095 (H) 08/30/2012 1449   IRONPCTSAT 12 03/12/2016 1308

## 2016-11-01 NOTE — Discharge Summary (Addendum)
Physician Discharge Summary  [name] LGX:[id number] DOB: 1955-10-07 DOA: 10/29/2016  PCP: Clovia Cuff, MD  Admit date: 10/29/2016 Discharge date: 11/01/2016  Recommendations for Outpatient Follow-up:  1. please check INR monday 9/24; report results to PCP; pt will not take coumadin due to INR 4.32 on 11/01/16  Discharge Diagnoses:  Principal Problem:   Chest pain Active Problems:   Hypothyroidism   End stage renal disease on dialysis [facility])   CAD (coronary artery disease), native coronary artery   History of prosthetic aortic valve replacement   S/P liver transplant ([place])   PAD (peripheral artery disease) ([place])   History of liver transplant ([place])   S/P bilateral BKA (below knee amputation) ([place])   Type 2 diabetes mellitus with peripheral neuropathy ([place])   H/O mitral valve replacement with mechanical valve    Discharge Condition: stable   Diet recommendation: as tolerated   History of present illness:   Per brief narrative 9/21 "61 yo female presents with chest pain. Patient is known to have end-stage renal disease, and coronary  artery disease,recent admission for chest pain. Chest pain was associated with dyspnea, and occurred after having dialysis.On physical examination, blood pressure 111/60, respirations 16, oxygen saturation 98%. Lungs were clear to auscultation, heart S1-S2 present rhythmic, abdomen was soft nontender, no lower extremity edema.sodium 135, potassium 4.5-100, bicarbonate 25, glucose 142, BUN 9, creatinine 3.54, troponin 0.61, white count 11.7, 1110.5, hematocrit 32.2, platelets 149. Chest x-ray with a small bilateral pleural effusions, EKG with left axis deviation, LVH, ST elevations on the precordial leads V1-V3.[name] Patient admitted with a working diagnosis of atypical chest pain, rule out acute coronary syndrome."  Hospital Course:   Principal Problem:   Chest pain - Continue aspirin and metoprolol - No chest pain  Active Problems:    Hypothyroidism - Continue synthroid    End stage renal disease on dialysis ([place]) - Management per renal     History of liver transplant ([place]) - Continue tacrolimus     S/P bilateral BKA (below knee amputation) (HCC) - Stable    Type 2 diabetes mellitus with peripheral neuropathy (HCC) - Not on antihyperglycemic meds at this time - Uses glucose tablets as needed for hypoglycemia      H/O mitral valve replacement with mechanical valve / CAD (coronary artery disease), native coronary artery - Coumadin on hold due to supratherapeutic INR, INR 4.3. Recommended rechecking INR September 24 and reporting results to primary care physician who can make adjustment to Coumadin    Stage 2 sacral pressure ulcer - Care per RN   DVT prophylaxis: INR supreatherpeutic  Code Status: DNR/DNI Family Communication:  No family at the bedside     Signed:  Leisa Lenz, MD  [facility] 11/01/2016, 8:48 AM  Pager #: [phone]  Time spent in minutes: more than 30 minutes   Consultations:  Cardio  Renal  Discharge Exam: Vitals:   11/01/16 0800 11/01/16 0830  BP: 113/71 (!) 138/27  Pulse: 74 78  Resp: (!) 26 (!) 26  Temp:    SpO2:     Vitals:   11/01/16 0713 11/01/16 0730 11/01/16 0800 11/01/16 0830  BP: 111/63 113/70 113/71 (!) 138/27  Pulse: 78 76 74 78  Resp: (!) 24 (!) 28 (!) 26 (!) 26  Temp:      TempSrc:      SpO2:      Weight:      Height:        General: Pt is alert, follows commands  appropriately, not in acute distress Cardiovascular: Regular rate and rhythm, S1/S2 + Respiratory: Clear to auscultation bilaterally, no wheezing, no crackles, no rhonchi Abdominal: Soft, non tender, non distended, bowel sounds +, no guarding Extremities: BKA b/l; also stage 2 sacral ulcer  Neuro: Grossly nonfocal  Discharge Instructions  Discharge Instructions    Call MD for:  persistant nausea and vomiting    Complete by:  As directed    Call MD for:  redness,  tenderness, or signs of infection (pain, swelling, redness, odor or green/yellow discharge around incision site)    Complete by:  As directed    Call MD for:  severe uncontrolled pain    Complete by:  As directed    Diet - low sodium heart healthy    Complete by:  As directed    Discharge instructions    Complete by:  As directed    Please check INR monday 9/24; report results to PCP; pt will not take coumadin due to INR 4.32 on 11/01/16   Increase activity slowly    Complete by:  As directed      Allergies as of 11/01/2016      Reactions   Acetaminophen Other (See Comments)   Liver transplant recipient    Morphine And Related Itching   Codeine Itching   Mirtazapine Other (See Comments)   hallucination      Medication List    STOP taking these medications   ranolazine 500 MG 12 hr tablet Commonly known as:  RANEXA   warfarin 2 MG tablet Commonly known as:  COUMADIN     TAKE these medications   ALPRAZolam 0.5 MG tablet Commonly known as:  XANAX Take 0.5 mg by mouth 3 (three) times daily as needed for anxiety.   aspirin EC 81 MG tablet Take 81 mg by mouth daily at 12 noon.   calcitRIOL 0.5 MCG capsule Commonly known as:  ROCALTROL Take 1 capsule (0.5 mcg total) by mouth every Monday, Wednesday, and Friday with hemodialysis.   calcium acetate 667 MG capsule Commonly known as:  PHOSLO Take 1 capsule (667 mg total) by mouth 3 (three) times daily with meals.   calcium carbonate 500 MG chewable tablet Commonly known as:  TUMS - dosed in mg elemental calcium Chew 1 tablet by mouth daily.   carvedilol 3.125 MG tablet Commonly known as:  COREG Take 3.125 mg by mouth 2 (two) times daily with a meal.   feeding supplement (PRO-STAT SUGAR FREE 64) Liqd Take 30 mLs by mouth 2 (two) times daily.   feeding supplement Liqd Take 1 Container by mouth 3 (three) times daily between meals.   gabapentin 300 MG capsule Commonly known as:  NEURONTIN Take 300 mg by mouth at  bedtime. Daughter reports patient taking 100 mg on hemodialysis days   glucose 4 GM chewable tablet Chew 4 g by mouth once as needed for low blood sugar.   lactulose 10 GM/15ML solution Commonly known as:  CHRONULAC Take 45 mLs (30 g total) by mouth 3 (three) times daily.   levothyroxine 200 MCG tablet Commonly known as:  SYNTHROID, LEVOTHROID Take 1 tablet (200 mcg total) by mouth daily before breakfast.   lidocaine 5 % Commonly known as:  LIDODERM Place 1 patch onto the skin daily. Remove & Discard patch within 12 hours or as directed by MD   methocarbamol 500 MG tablet Commonly known as:  ROBAXIN Take 0.5 tablets (250 mg total) by mouth every 6 (six) hours as needed for  muscle spasms.   metoCLOPramide 5 MG tablet Commonly known as:  REGLAN Take 1 tablet (5 mg total) by mouth 2 (two) times daily before a meal. Decrease to once a day before breakfast after a couple of weeks. What changed:  when to take this  reasons to take this  additional instructions   metoprolol tartrate 25 MG tablet Commonly known as:  LOPRESSOR Take 0.5 tablets (12.5 mg total) by mouth 2 (two) times daily.   midodrine 10 MG tablet Commonly known as:  PROAMATINE Take 1 tablet (10 mg total) by mouth 3 (three) times daily with meals.   mirtazapine 15 MG tablet Commonly known as:  REMERON Take 15 mg by mouth at bedtime.   multivitamin Tabs tablet Take 1 tablet by mouth at bedtime.   nitroGLYCERIN 0.4 MG SL tablet Commonly known as:  NITROSTAT Place 1 tablet (0.4 mg total) under the tongue every 5 (five) minutes as needed for chest pain.   ondansetron 8 MG disintegrating tablet Commonly known as:  ZOFRAN ODT 8mg  ODT q4 hours prn nausea What changed:  how much to take  how to take this  when to take this  reasons to take this  additional instructions   oxyCODONE 5 MG immediate release tablet Commonly known as:  Oxy IR/ROXICODONE Take 1 tablet (5 mg total) by mouth every 4 (four)  hours as needed for severe pain.   promethazine 12.5 MG tablet Commonly known as:  PHENERGAN Take 12.5 mg by mouth every 6 (six) hours as needed for nausea or vomiting.   tacrolimus 1 MG capsule Commonly known as:  PROGRAF Take 2 mg by mouth 2 (two) times daily.   traMADol 50 MG tablet Commonly known as:  ULTRAM Take 50 mg by mouth 3 (three) times daily as needed for moderate pain.   traZODone 50 MG tablet Commonly known as:  DESYREL Take 50 mg by mouth at bedtime.            Discharge Care Instructions        Start     Ordered   11/01/16 0000  lactulose (CHRONULAC) 10 GM/15ML solution  3 times daily     11/01/16 0848   11/01/16 0000  Increase activity slowly     11/01/16 0848   11/01/16 0000  Diet - low sodium heart healthy     11/01/16 0848   11/01/16 0000  Call MD for:  persistant nausea and vomiting     11/01/16 0848   11/01/16 0000  Call MD for:  severe uncontrolled pain     11/01/16 0848   11/01/16 0000  Call MD for:  redness, tenderness, or signs of infection (pain, swelling, redness, odor or green/yellow discharge around incision site)     11/01/16 0848   11/01/16 0000  Discharge instructions    Comments:  Please check INR monday 9/24; report results to PCP; pt will not take coumadin due to INR 4.32 on 11/01/16   11/01/16 0848      Follow-up Information    Clovia Cuff, MD. Schedule an appointment as soon as possible for a visit on 11/03/2016.   Specialty:  Internal Medicine Contact information: [address] [place] [address] [phone]            The results of significant diagnostics from this hospitalization (including imaging, microbiology, ancillary and laboratory) are listed below for reference.    Significant Diagnostic Studies: Dg Chest 2 View  Result Date: 10/29/2016 CLINICAL DATA:  Chest pain and shortness of breath EXAM: CHEST  2 VIEW COMPARISON:  10/06/2016 FINDINGS: Post sternotomy changes and  postsurgical changes in the upper abdomen. Right-sided central venous catheter tip overlies the proximal to mid right atrium. There are small bilateral effusions. There is mild cardiomegaly with central vascular congestion and mild interstitial edema. IMPRESSION: 1. Cardiomegaly with central vascular congestion and increased interstitial edema. Slight increased small bilateral effusions. Electronically Signed   By: Donavan Foil M.D.   On: 10/29/2016 20:23    Microbiology: Recent Results (from the past 240 hour(s))  MRSA PCR Screening     Status: None   Collection Time: 10/30/16  8:25 PM  Result Value Ref Range Status   MRSA by PCR NEGATIVE NEGATIVE Final    Comment:        The GeneXpert MRSA Assay (FDA approved for NASAL specimens only), is one component of a comprehensive MRSA colonization surveillance program. It is not intended to diagnose MRSA infection nor to guide or monitor treatment for MRSA infections.      Labs: Basic Metabolic Panel:  Recent Labs Lab 10/29/16 1953 10/30/16 0629 11/01/16 0717  NA 133* 135 135  K 5.8* 4.5 4.7  CL 99* 100* 98*  CO2 26 25 26   GLUCOSE 151* 142* 200*  BUN 7 9 25*  CREATININE 2.97* 3.54* 6.16*  CALCIUM 8.4* 8.6* 9.0   Liver Function Tests:  Recent Labs Lab 10/29/16 2115  AST 46*  ALT 19  ALKPHOS 167*  BILITOT 1.8*  PROT 6.8  ALBUMIN 1.9*   No results for input(s): LIPASE, AMYLASE in the last 168 hours.  Recent Labs Lab 10/29/16 2115  AMMONIA 42*   CBC:  Recent Labs Lab 10/29/16 1900 10/30/16 0629 11/01/16 0717  WBC 11.4* 11.7* 10.5  NEUTROABS  --   --  7.9*  HGB 11.0* 10.5* 10.6*  HCT 33.9* 32.2* 32.8*  MCV 90.2 91.7 91.4  PLT 233 149* 140*   Cardiac Enzymes:  Recent Labs Lab 10/30/16 0056 10/30/16 0629 10/30/16 1148  TROPONINI 0.61* 0.61* 0.67*   BNP: BNP (last 3 results)  Recent Labs  08/09/16 1930 08/27/16 0738 09/29/16 0510  BNP >4,500.0* >4,500.0* 2,546.5*    ProBNP (last 3  results) No results for input(s): PROBNP in the last 8760 hours.  CBG: No results for input(s): GLUCAP in the last 168 hours.

## 2016-11-01 NOTE — Progress Notes (Signed)
[place] for warfarin  Indication: mechanical aortic valve   Allergies  Allergen Reactions  . Acetaminophen Other (See Comments)    Liver transplant recipient   . Morphine And Related Itching  . Codeine Itching  . Mirtazapine Other (See Comments)    hallucination    Patient Measurements: Height: 5\' 4"  (162.6 cm) Weight: 99 lb 13.9 oz (45.3 kg) IBW/kg (Calculated) : 54.7  Vital Signs: Temp: 97.6 F (36.4 C) (09/22 1105) Temp Source: Axillary (09/22 1105) BP: 93/66 (09/22 1136) Pulse Rate: 78 (09/22 1105)  Labs:  Recent Labs  10/29/16 1900 10/29/16 1953  10/30/16 0056 10/30/16 0629 10/30/16 1148 10/31/16 0335 11/01/16 0717  HGB 11.0*  --   --   --  10.5*  --   --  10.6*  HCT 33.9*  --   --   --  32.2*  --   --  32.8*  PLT 233  --   --   --  149*  --   --  140*  LABPROT  --   --   < >  --  36.7*  --  41.1* 41.1*  INR  --   --   < >  --  3.74  --  4.32* 4.32*  CREATININE  --  2.97*  --   --  3.54*  --   --  6.16*  TROPONINI  --   --   --  0.61* 0.61* 0.67*  --   --   < > = values in this interval not displayed.  Estimated Creatinine Clearance: 6.8 mL/min (A) (by C-G formula based on SCr of 6.16 mg/dL (H)).   Medical History: Past Medical History:  Diagnosis Date  . Anemia    takes Folic Acid daily  . Anxiety   . Arthritis    "left hand, back" (08/30/2012)  . Asthma   . CAD (coronary artery disease) Jan. 2015   Cath: 20% LAD, 50% D1; s/p LIMA-LAD  . Chest pain 03/06/2015  . CHF (congestive heart failure) ([place]) 05/2016  . Chronic back pain   . Chronic constipation    takes MIralax and Colace daily  . COPD (chronic obstructive pulmonary disease) ([place])   . Depression    takes Cymbalta for "severe" depression  . ESRD on dialysis [facility])    "MWF; [facility]" (08/12/2016)  . GERD (gastroesophageal reflux disease)    takes Omeprazole daily  . Headache    "at least monthly" (08/30/2012)  . Hepatitis C   . History of blood  transfusion    "several" (08/30/2012)  . Hypertension    takes Metoprolol and Lisinopril daily, sees Dr Bea Graff  . Hypothyroidism    takes Synthroid daily  . Migraine    "last migraine was in 2013" (08/30/2012)  . Myocardial infarction ([place])   . Neuromuscular disorder ([place])    carpal tunnel in right hand  . Obesity   . Peripheral vascular disease (HCC) hands and legs  . Pneumonia    "today and several times before" (08/30/2012)  . 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 S/P bilateral BKA (below knee amputation) ([place]) 04/30/2016  . 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 SVT (supraventricular tachycardia) ([place]) 06/09/14  . Tobacco abuse   . Type II diabetes mellitus (HCC)    Levemir 2units daily if > 150  Assessment: 61 yo female admitted with chest pain. Patient on warfarin PTA for mechanical aortic valve.  -INR on admission 3.69 now up to 4.32 today and is above target goal.   Per [facility] visit on 9/18, patient INR was 6. Patient instructed to hold warfarin Tues/Wed/Thurs and resume with dose of 2mg  on all days, except 1mg  on MonFri.   Goal of Therapy:  INR 2.5-3.5    Plan:  Hold warfarin tonight Daily INR  Monitor for s/sx of bleeding  Jalene Mullet, Pharm.D. PGY1 Pharmacy Resident 11/01/2016 1:26 PM Main Pharmacy: [phone]

## 2016-11-01 NOTE — Discharge Instructions (Signed)
Warfarin tablets What is this medicine? WARFARIN (WAR far in) is an anticoagulant. It is used to treat or prevent clots in the veins, arteries, lungs, or heart. This medicine may be used for other purposes; ask your health care provider or pharmacist if you have questions. COMMON BRAND NAME(S): Coumadin, Jantoven What should I tell my health care provider before I take this medicine? They need to know if you have any of these conditions: -alcoholism -anemia -bleeding disorders -cancer -diabetes -heart disease -high blood pressure -history of bleeding in the gastrointestinal tract -history of stroke or other brain injury or disease -kidney or liver disease -protein C deficiency -protein S deficiency -psychosis or dementia -recent injury, recent or planned surgery or procedure -an unusual or allergic reaction to warfarin, other medicines, foods, dyes, or preservatives -pregnant or trying to get pregnant -breast-feeding How should I use this medicine? Take this medicine by mouth with a glass of water. Follow the directions on the prescription label. You can take this medicine with or without food. Take your medicine at the same time each day. Do not take it more often than directed. Do not stop taking except on your doctor's advice. Stopping this medicine may increase your risk of a blood clot. Be sure to refill your prescription before you run out of medicine. If your doctor or healthcare professional calls to change your dose, write down the dose and any other instructions. Always read the dose and instructions back to him or her to make sure you understand them. Tell your doctor or healthcare professional what strength of tablets you have on hand. Ask how many tablets you should take to equal your new dose. Write the date on the new instructions and keep them near your medicine. If you are told to stop taking your medicine until your next blood test, call your doctor or healthcare  professional if you do not hear anything within 24 hours of the test to find out your new dose or when to restart your prior dose. A special MedGuide will be given to you by the pharmacist with each prescription and refill. Be sure to read this information carefully each time. Talk to your pediatrician regarding the use of this medicine in children. Special care may be needed. Overdosage: If you think you have taken too much of this medicine contact a poison control center or emergency room at once. NOTE: This medicine is only for you. Do not share this medicine with others. What if I miss a dose? It is important not to miss a dose. If you miss a dose, call your healthcare provider. Take the dose as soon as possible on the same day. If it is almost time for your next dose, take only that dose. Do not take double or extra doses to make up for a missed dose. What may interact with this medicine? Do not take this medicine with any of the following medications: -agents that prevent or dissolve blood clots -aspirin or other salicylates -danshen -dextrothyroxine -mifepristone -St. John's Wort -red yeast rice This medicine may also interact with the following medications: -acetaminophen -agents that lower cholesterol -alcohol -allopurinol -amiodarone -antibiotics or medicines for treating bacterial, fungal or viral infections -azathioprine -barbiturate medicines for inducing sleep or treating seizures -certain medicines for diabetes -certain medicines for heart rhythm problems -certain medicines for hepatitis C virus infections like daclatasvir, dasabuvir; ombitasvir; paritaprevir; ritonavir, elbasvir; grazoprevir, ledipasvir; sofosbuvir, simeprevir, sofosbuvir, sofosbuvir; velpatasvir, sofosbuvir; velpatasvir; voxilaprevir -certain medicines for high blood pressure -chloral hydrate -  cisapride -conivaptan -disulfiram -female hormones, including contraceptive or birth control pills -general  anesthetics -herbal or dietary products like garlic, ginkgo, ginseng, green tea, or kava kava -influenza virus vaccine -female hormones -medicines for mental depression or psychosis -medicines for some types of cancer -medicines for stomach problems -methylphenidate -NSAIDs, medicines for pain and inflammation, like ibuprofen or naproxen -propoxyphene -quinidine, quinine -raloxifene -seizure or epilepsy medicine like carbamazepine, phenytoin, and valproic acid -steroids like cortisone and prednisone -tamoxifen -thyroid medicine -tramadol -vitamin c, vitamin e, and vitamin K -zafirlukast -zileuton This list may not describe all possible interactions. Give your health care provider a list of all the medicines, herbs, non-prescription drugs, or dietary supplements you use. Also tell them if you smoke, drink alcohol, or use illegal drugs. Some items may interact with your medicine. What should I watch for while using this medicine? Visit your doctor or health care professional for regular checks on your progress. You will need to have a blood test called a PT/INR regularly. The PT/INR blood test is done to make sure you are getting the right dose of this medicine. It is important to not miss your appointment for the blood tests. When you first start taking this medicine, these tests are done often. Once the correct dose is determined and you take your medicine properly, these tests can be done less often. Notify your doctor or health care professional and seek emergency treatment if you develop breathing problems; changes in vision; chest pain; severe, sudden headache; pain, swelling, warmth in the leg; trouble speaking; sudden numbness or weakness of the face, arm or leg. These can be signs that your condition has gotten worse. While you are taking this medicine, carry an identification card with your name, the name and dose of medicine(s) being used, and the name and phone number of your doctor  or health care professional or person to contact in an emergency. Do not start taking or stop taking any medicines or over-the-counter medicines except on the advice of your doctor or health care professional. You should discuss your diet with your doctor or health care professional. Do not make major changes in your diet. Vitamin K can affect how well this medicine works. Many foods contain vitamin K. It is important to eat a consistent amount of foods with vitamin K. Other foods with vitamin K that you should eat in consistent amounts are asparagus, basil, black eyed peas, broccoli, brussel sprouts, cabbage, green onions, green tea, parsley, green leafy vegetables like beet greens, collard greens, kale, spinach, turnip greens, or certain lettuces like green leaf or romaine. This medicine can cause birth defects or bleeding in an unborn child. Women of childbearing age should use effective birth control while taking this medicine. If a woman becomes pregnant while taking this medicine, she should discuss the potential risks and her options with her health care professional. Avoid sports and activities that might cause injury while you are using this medicine. Severe falls or injuries can cause unseen bleeding. Be careful when using sharp tools or knives. Consider using an Copy. Take special care brushing or flossing your teeth. Report any injuries, bruising, or red spots on the skin to your doctor or health care professional. If you have an illness that causes vomiting, diarrhea, or fever for more than a few days, contact your doctor. Also check with your doctor if you are unable to eat for several days. These problems can change the effect of this medicine. Even after you stop taking this  medicine, it takes several days before your body recovers its normal ability to clot blood. Ask your doctor or health care professional how long you need to be careful. If you are going to have surgery or dental  work, tell your doctor or health care professional that you have been taking this medicine. °What side effects may I notice from receiving this medicine? °Side effects that you should report to your doctor or health care professional as soon as possible: °-allergic reactions like skin rash, itching or hives, swelling of the face, lips, or tongue °-breathing problems °-chest pain °-dizziness °-headache °-heavy menstrual bleeding or vaginal bleeding °-pain in the lower back or side °-painful, blue or purple toes °-painful skin ulcers that do not go away °-signs and symptoms of bleeding such as bloody or black, tarry stools; red or dark-brown urine; spitting up blood or brown material that looks like coffee grounds; red spots on the skin; unusual bruising or bleeding from the eye, gums, or nose °-stomach pain °-unusually weak or tired °Side effects that usually do not require medical attention (report to your doctor or health care professional if they continue or are bothersome): °-diarrhea °-hair loss °This list may not describe all possible side effects. Call your doctor for medical advice about side effects. You may report side effects to FDA at 1-800-FDA-1088. °Where should I keep my medicine? °Keep out of the reach of children. °Store at room temperature between 15 and 30 degrees C (59 and 86 degrees F). Protect from light. Throw away any unused medicine after the expiration date. Do not flush down the toilet. °NOTE: This sheet is a summary. It may not cover all possible information. If you have questions about this medicine, talk to your doctor, pharmacist, or health care provider. °© 2018 [employer] (2016-01-17 11:27:41) ° °

## 2016-11-03 ENCOUNTER — Ambulatory Visit: Payer: Self-pay | Admitting: *Deleted

## 2016-11-03 DIAGNOSIS — D631 Anemia in chronic kidney disease: Secondary | ICD-10-CM | POA: Diagnosis not present

## 2016-11-03 DIAGNOSIS — I359 Nonrheumatic aortic valve disorder, unspecified: Secondary | ICD-10-CM | POA: Diagnosis not present

## 2016-11-03 DIAGNOSIS — E118 Type 2 diabetes mellitus with unspecified complications: Secondary | ICD-10-CM | POA: Diagnosis not present

## 2016-11-03 DIAGNOSIS — N2581 Secondary hyperparathyroidism of renal origin: Secondary | ICD-10-CM | POA: Diagnosis not present

## 2016-11-03 DIAGNOSIS — N186 End stage renal disease: Secondary | ICD-10-CM | POA: Diagnosis not present

## 2016-11-03 DIAGNOSIS — E876 Hypokalemia: Secondary | ICD-10-CM | POA: Diagnosis not present

## 2016-11-03 DIAGNOSIS — Z7901 Long term (current) use of anticoagulants: Secondary | ICD-10-CM | POA: Diagnosis not present

## 2016-11-03 LAB — PROTIME-INR: INR: 5 — AB (ref 0.9–1.1)

## 2016-11-04 ENCOUNTER — Ambulatory Visit: Payer: Medicare Other | Admitting: *Deleted

## 2016-11-04 ENCOUNTER — Other Ambulatory Visit: Payer: Self-pay

## 2016-11-04 ENCOUNTER — Ambulatory Visit: Payer: Medicare Other

## 2016-11-04 ENCOUNTER — Ambulatory Visit (INDEPENDENT_AMBULATORY_CARE_PROVIDER_SITE_OTHER): Payer: Medicare Other | Admitting: Pharmacist Clinician (PhC)/ Clinical Pharmacy Specialist

## 2016-11-04 DIAGNOSIS — Z7901 Long term (current) use of anticoagulants: Secondary | ICD-10-CM

## 2016-11-04 DIAGNOSIS — I2510 Atherosclerotic heart disease of native coronary artery without angina pectoris: Secondary | ICD-10-CM | POA: Diagnosis not present

## 2016-11-04 DIAGNOSIS — I214 Non-ST elevation (NSTEMI) myocardial infarction: Secondary | ICD-10-CM | POA: Diagnosis not present

## 2016-11-04 NOTE — Patient Outreach (Signed)
Transition of care: Readmitted on 10/29/2016 Discharged on 11/01/2016  Patient reports that she went to the ED for chest pain and shortness of breath. Reports no BM for 8 days and was having constipation. Patient reports that when constipation went away so did her chest pain and shortness of breath.  Patient reports that dialysis is currently going well.  Report that BP is better.  Patient reports that she is in better spirits.  Denies any new concerns. Reports that she has all her medications and is taking them as prescribed. States that she has someone who is staying with her if she needs help while her daughter is at work.  PLAN: will continue weekly transition of care calls.  [name], RN, BSN, CEN [facility] [employer] [phone]

## 2016-11-05 DIAGNOSIS — I214 Non-ST elevation (NSTEMI) myocardial infarction: Secondary | ICD-10-CM | POA: Diagnosis not present

## 2016-11-05 DIAGNOSIS — E876 Hypokalemia: Secondary | ICD-10-CM | POA: Diagnosis not present

## 2016-11-05 DIAGNOSIS — N186 End stage renal disease: Secondary | ICD-10-CM | POA: Diagnosis not present

## 2016-11-05 DIAGNOSIS — E118 Type 2 diabetes mellitus with unspecified complications: Secondary | ICD-10-CM | POA: Diagnosis not present

## 2016-11-05 DIAGNOSIS — N2581 Secondary hyperparathyroidism of renal origin: Secondary | ICD-10-CM | POA: Diagnosis not present

## 2016-11-05 DIAGNOSIS — I2510 Atherosclerotic heart disease of native coronary artery without angina pectoris: Secondary | ICD-10-CM | POA: Diagnosis not present

## 2016-11-05 DIAGNOSIS — D631 Anemia in chronic kidney disease: Secondary | ICD-10-CM | POA: Diagnosis not present

## 2016-11-06 ENCOUNTER — Ambulatory Visit (INDEPENDENT_AMBULATORY_CARE_PROVIDER_SITE_OTHER): Payer: Medicare Other | Admitting: Pharmacist Clinician (PhC)/ Clinical Pharmacy Specialist

## 2016-11-06 DIAGNOSIS — Z952 Presence of prosthetic heart valve: Secondary | ICD-10-CM | POA: Diagnosis not present

## 2016-11-06 DIAGNOSIS — I214 Non-ST elevation (NSTEMI) myocardial infarction: Secondary | ICD-10-CM | POA: Diagnosis not present

## 2016-11-06 DIAGNOSIS — Z7901 Long term (current) use of anticoagulants: Secondary | ICD-10-CM

## 2016-11-06 DIAGNOSIS — I2510 Atherosclerotic heart disease of native coronary artery without angina pectoris: Secondary | ICD-10-CM | POA: Diagnosis not present

## 2016-11-06 LAB — POCT INR: INR: 2.3

## 2016-11-07 ENCOUNTER — Ambulatory Visit: Payer: Self-pay | Admitting: *Deleted

## 2016-11-07 DIAGNOSIS — I214 Non-ST elevation (NSTEMI) myocardial infarction: Secondary | ICD-10-CM | POA: Diagnosis not present

## 2016-11-07 DIAGNOSIS — D631 Anemia in chronic kidney disease: Secondary | ICD-10-CM | POA: Diagnosis not present

## 2016-11-07 DIAGNOSIS — N2581 Secondary hyperparathyroidism of renal origin: Secondary | ICD-10-CM | POA: Diagnosis not present

## 2016-11-07 DIAGNOSIS — I2510 Atherosclerotic heart disease of native coronary artery without angina pectoris: Secondary | ICD-10-CM | POA: Diagnosis not present

## 2016-11-07 DIAGNOSIS — E876 Hypokalemia: Secondary | ICD-10-CM | POA: Diagnosis not present

## 2016-11-07 DIAGNOSIS — E118 Type 2 diabetes mellitus with unspecified complications: Secondary | ICD-10-CM | POA: Diagnosis not present

## 2016-11-07 DIAGNOSIS — N186 End stage renal disease: Secondary | ICD-10-CM | POA: Diagnosis not present

## 2016-11-08 DIAGNOSIS — I132 Hypertensive heart and chronic kidney disease with heart failure and with stage 5 chronic kidney disease, or end stage renal disease: Secondary | ICD-10-CM | POA: Diagnosis not present

## 2016-11-09 DIAGNOSIS — Z992 Dependence on renal dialysis: Secondary | ICD-10-CM | POA: Diagnosis not present

## 2016-11-09 DIAGNOSIS — T8640 Unspecified complication of liver transplant: Secondary | ICD-10-CM | POA: Diagnosis not present

## 2016-11-09 DIAGNOSIS — N186 End stage renal disease: Secondary | ICD-10-CM | POA: Diagnosis not present

## 2016-11-10 DIAGNOSIS — I2510 Atherosclerotic heart disease of native coronary artery without angina pectoris: Secondary | ICD-10-CM | POA: Diagnosis not present

## 2016-11-10 DIAGNOSIS — I214 Non-ST elevation (NSTEMI) myocardial infarction: Secondary | ICD-10-CM | POA: Diagnosis not present

## 2016-11-10 DIAGNOSIS — Z992 Dependence on renal dialysis: Secondary | ICD-10-CM | POA: Diagnosis not present

## 2016-11-10 DIAGNOSIS — Z7901 Long term (current) use of anticoagulants: Secondary | ICD-10-CM | POA: Diagnosis not present

## 2016-11-10 DIAGNOSIS — E876 Hypokalemia: Secondary | ICD-10-CM | POA: Diagnosis not present

## 2016-11-10 DIAGNOSIS — T8640 Unspecified complication of liver transplant: Secondary | ICD-10-CM | POA: Diagnosis not present

## 2016-11-10 DIAGNOSIS — N2581 Secondary hyperparathyroidism of renal origin: Secondary | ICD-10-CM | POA: Diagnosis not present

## 2016-11-10 DIAGNOSIS — I359 Nonrheumatic aortic valve disorder, unspecified: Secondary | ICD-10-CM | POA: Diagnosis not present

## 2016-11-10 DIAGNOSIS — N186 End stage renal disease: Secondary | ICD-10-CM | POA: Diagnosis not present

## 2016-11-10 LAB — PROTIME-INR: INR: 1.7 — AB (ref 0.9–1.1)

## 2016-11-11 ENCOUNTER — Ambulatory Visit (INDEPENDENT_AMBULATORY_CARE_PROVIDER_SITE_OTHER): Payer: Medicare Other | Admitting: Pharmacist Clinician (PhC)/ Clinical Pharmacy Specialist

## 2016-11-11 ENCOUNTER — Other Ambulatory Visit: Payer: Self-pay | Admitting: *Deleted

## 2016-11-11 ENCOUNTER — Other Ambulatory Visit: Payer: Self-pay

## 2016-11-11 DIAGNOSIS — I214 Non-ST elevation (NSTEMI) myocardial infarction: Secondary | ICD-10-CM | POA: Diagnosis not present

## 2016-11-11 DIAGNOSIS — Z7901 Long term (current) use of anticoagulants: Secondary | ICD-10-CM

## 2016-11-11 DIAGNOSIS — I2510 Atherosclerotic heart disease of native coronary artery without angina pectoris: Secondary | ICD-10-CM | POA: Diagnosis not present

## 2016-11-11 LAB — POCT INR: INR: 1.3

## 2016-11-11 NOTE — Patient Outreach (Signed)
Transition of care:  Placed call to patient. No answer. Left a message requesting a call back.    PLAN: will continue weekly outreach attempts.  [name], RN, BSN, CEN [facility] [employer] [phone]

## 2016-11-12 ENCOUNTER — Ambulatory Visit: Payer: Medicare Other | Admitting: Family

## 2016-11-12 DIAGNOSIS — N2581 Secondary hyperparathyroidism of renal origin: Secondary | ICD-10-CM | POA: Diagnosis not present

## 2016-11-12 DIAGNOSIS — N186 End stage renal disease: Secondary | ICD-10-CM | POA: Diagnosis not present

## 2016-11-12 DIAGNOSIS — I214 Non-ST elevation (NSTEMI) myocardial infarction: Secondary | ICD-10-CM | POA: Diagnosis not present

## 2016-11-12 DIAGNOSIS — I2510 Atherosclerotic heart disease of native coronary artery without angina pectoris: Secondary | ICD-10-CM | POA: Diagnosis not present

## 2016-11-12 DIAGNOSIS — E876 Hypokalemia: Secondary | ICD-10-CM | POA: Diagnosis not present

## 2016-11-14 DIAGNOSIS — N2581 Secondary hyperparathyroidism of renal origin: Secondary | ICD-10-CM | POA: Diagnosis not present

## 2016-11-14 DIAGNOSIS — I214 Non-ST elevation (NSTEMI) myocardial infarction: Secondary | ICD-10-CM | POA: Diagnosis not present

## 2016-11-14 DIAGNOSIS — E876 Hypokalemia: Secondary | ICD-10-CM | POA: Diagnosis not present

## 2016-11-14 DIAGNOSIS — I2510 Atherosclerotic heart disease of native coronary artery without angina pectoris: Secondary | ICD-10-CM | POA: Diagnosis not present

## 2016-11-14 DIAGNOSIS — N186 End stage renal disease: Secondary | ICD-10-CM | POA: Diagnosis not present

## 2016-11-15 DIAGNOSIS — R0602 Shortness of breath: Secondary | ICD-10-CM | POA: Diagnosis not present

## 2016-11-16 ENCOUNTER — Inpatient Hospital Stay (HOSPITAL_COMMUNITY)
Admission: EM | Admit: 2016-11-16 | Discharge: 2016-12-11 | DRG: 871 | Disposition: E | Payer: Medicare Other | Attending: Family Medicine | Admitting: Family Medicine

## 2016-11-16 ENCOUNTER — Emergency Department (HOSPITAL_COMMUNITY): Payer: Medicare Other

## 2016-11-16 ENCOUNTER — Encounter (HOSPITAL_COMMUNITY): Payer: Self-pay | Admitting: Emergency Medicine

## 2016-11-16 DIAGNOSIS — E039 Hypothyroidism, unspecified: Secondary | ICD-10-CM | POA: Diagnosis not present

## 2016-11-16 DIAGNOSIS — N2581 Secondary hyperparathyroidism of renal origin: Secondary | ICD-10-CM | POA: Diagnosis present

## 2016-11-16 DIAGNOSIS — J449 Chronic obstructive pulmonary disease, unspecified: Secondary | ICD-10-CM | POA: Diagnosis present

## 2016-11-16 DIAGNOSIS — Z992 Dependence on renal dialysis: Secondary | ICD-10-CM | POA: Diagnosis not present

## 2016-11-16 DIAGNOSIS — Z89511 Acquired absence of right leg below knee: Secondary | ICD-10-CM | POA: Diagnosis not present

## 2016-11-16 DIAGNOSIS — I2510 Atherosclerotic heart disease of native coronary artery without angina pectoris: Secondary | ICD-10-CM | POA: Diagnosis not present

## 2016-11-16 DIAGNOSIS — J690 Pneumonitis due to inhalation of food and vomit: Secondary | ICD-10-CM | POA: Diagnosis present

## 2016-11-16 DIAGNOSIS — M479 Spondylosis, unspecified: Secondary | ICD-10-CM | POA: Diagnosis present

## 2016-11-16 DIAGNOSIS — I471 Supraventricular tachycardia: Secondary | ICD-10-CM | POA: Diagnosis not present

## 2016-11-16 DIAGNOSIS — Z8249 Family history of ischemic heart disease and other diseases of the circulatory system: Secondary | ICD-10-CM

## 2016-11-16 DIAGNOSIS — I25118 Atherosclerotic heart disease of native coronary artery with other forms of angina pectoris: Secondary | ICD-10-CM | POA: Diagnosis present

## 2016-11-16 DIAGNOSIS — N186 End stage renal disease: Secondary | ICD-10-CM | POA: Diagnosis not present

## 2016-11-16 DIAGNOSIS — E1142 Type 2 diabetes mellitus with diabetic polyneuropathy: Secondary | ICD-10-CM | POA: Diagnosis present

## 2016-11-16 DIAGNOSIS — F17210 Nicotine dependence, cigarettes, uncomplicated: Secondary | ICD-10-CM | POA: Diagnosis not present

## 2016-11-16 DIAGNOSIS — Z944 Liver transplant status: Secondary | ICD-10-CM

## 2016-11-16 DIAGNOSIS — F419 Anxiety disorder, unspecified: Secondary | ICD-10-CM | POA: Diagnosis present

## 2016-11-16 DIAGNOSIS — I9589 Other hypotension: Secondary | ICD-10-CM | POA: Diagnosis present

## 2016-11-16 DIAGNOSIS — Z823 Family history of stroke: Secondary | ICD-10-CM

## 2016-11-16 DIAGNOSIS — I252 Old myocardial infarction: Secondary | ICD-10-CM

## 2016-11-16 DIAGNOSIS — D696 Thrombocytopenia, unspecified: Secondary | ICD-10-CM | POA: Diagnosis present

## 2016-11-16 DIAGNOSIS — Z66 Do not resuscitate: Secondary | ICD-10-CM | POA: Diagnosis not present

## 2016-11-16 DIAGNOSIS — I5042 Chronic combined systolic (congestive) and diastolic (congestive) heart failure: Secondary | ICD-10-CM | POA: Diagnosis present

## 2016-11-16 DIAGNOSIS — E1122 Type 2 diabetes mellitus with diabetic chronic kidney disease: Secondary | ICD-10-CM | POA: Diagnosis present

## 2016-11-16 DIAGNOSIS — I959 Hypotension, unspecified: Secondary | ICD-10-CM | POA: Diagnosis present

## 2016-11-16 DIAGNOSIS — M549 Dorsalgia, unspecified: Secondary | ICD-10-CM | POA: Diagnosis present

## 2016-11-16 DIAGNOSIS — Z951 Presence of aortocoronary bypass graft: Secondary | ICD-10-CM

## 2016-11-16 DIAGNOSIS — R0609 Other forms of dyspnea: Secondary | ICD-10-CM | POA: Diagnosis not present

## 2016-11-16 DIAGNOSIS — I132 Hypertensive heart and chronic kidney disease with heart failure and with stage 5 chronic kidney disease, or end stage renal disease: Secondary | ICD-10-CM | POA: Diagnosis present

## 2016-11-16 DIAGNOSIS — Z885 Allergy status to narcotic agent status: Secondary | ICD-10-CM

## 2016-11-16 DIAGNOSIS — D631 Anemia in chronic kidney disease: Secondary | ICD-10-CM | POA: Diagnosis present

## 2016-11-16 DIAGNOSIS — I052 Rheumatic mitral stenosis with insufficiency: Secondary | ICD-10-CM | POA: Diagnosis present

## 2016-11-16 DIAGNOSIS — R627 Adult failure to thrive: Secondary | ICD-10-CM | POA: Diagnosis present

## 2016-11-16 DIAGNOSIS — F329 Major depressive disorder, single episode, unspecified: Secondary | ICD-10-CM | POA: Diagnosis present

## 2016-11-16 DIAGNOSIS — L89152 Pressure ulcer of sacral region, stage 2: Secondary | ICD-10-CM | POA: Diagnosis present

## 2016-11-16 DIAGNOSIS — J9601 Acute respiratory failure with hypoxia: Secondary | ICD-10-CM | POA: Diagnosis present

## 2016-11-16 DIAGNOSIS — G8929 Other chronic pain: Secondary | ICD-10-CM | POA: Diagnosis present

## 2016-11-16 DIAGNOSIS — Z89512 Acquired absence of left leg below knee: Secondary | ICD-10-CM | POA: Diagnosis not present

## 2016-11-16 DIAGNOSIS — E1151 Type 2 diabetes mellitus with diabetic peripheral angiopathy without gangrene: Secondary | ICD-10-CM | POA: Diagnosis present

## 2016-11-16 DIAGNOSIS — D6949 Other primary thrombocytopenia: Secondary | ICD-10-CM | POA: Diagnosis present

## 2016-11-16 DIAGNOSIS — A419 Sepsis, unspecified organism: Secondary | ICD-10-CM | POA: Diagnosis not present

## 2016-11-16 DIAGNOSIS — J181 Lobar pneumonia, unspecified organism: Secondary | ICD-10-CM | POA: Diagnosis not present

## 2016-11-16 DIAGNOSIS — E8889 Other specified metabolic disorders: Secondary | ICD-10-CM | POA: Diagnosis present

## 2016-11-16 DIAGNOSIS — Z720 Tobacco use: Secondary | ICD-10-CM | POA: Diagnosis not present

## 2016-11-16 DIAGNOSIS — Z515 Encounter for palliative care: Secondary | ICD-10-CM | POA: Diagnosis not present

## 2016-11-16 DIAGNOSIS — Z9049 Acquired absence of other specified parts of digestive tract: Secondary | ICD-10-CM

## 2016-11-16 DIAGNOSIS — R791 Abnormal coagulation profile: Secondary | ICD-10-CM | POA: Diagnosis present

## 2016-11-16 DIAGNOSIS — Z79899 Other long term (current) drug therapy: Secondary | ICD-10-CM

## 2016-11-16 DIAGNOSIS — K219 Gastro-esophageal reflux disease without esophagitis: Secondary | ICD-10-CM | POA: Diagnosis present

## 2016-11-16 DIAGNOSIS — Z7901 Long term (current) use of anticoagulants: Secondary | ICD-10-CM

## 2016-11-16 DIAGNOSIS — Z952 Presence of prosthetic heart valve: Secondary | ICD-10-CM | POA: Diagnosis not present

## 2016-11-16 DIAGNOSIS — B1920 Unspecified viral hepatitis C without hepatic coma: Secondary | ICD-10-CM | POA: Diagnosis present

## 2016-11-16 DIAGNOSIS — R634 Abnormal weight loss: Secondary | ICD-10-CM | POA: Diagnosis present

## 2016-11-16 DIAGNOSIS — Z888 Allergy status to other drugs, medicaments and biological substances status: Secondary | ICD-10-CM

## 2016-11-16 DIAGNOSIS — Z833 Family history of diabetes mellitus: Secondary | ICD-10-CM

## 2016-11-16 DIAGNOSIS — K5909 Other constipation: Secondary | ICD-10-CM | POA: Diagnosis present

## 2016-11-16 DIAGNOSIS — R0602 Shortness of breath: Secondary | ICD-10-CM | POA: Diagnosis not present

## 2016-11-16 DIAGNOSIS — K7460 Unspecified cirrhosis of liver: Secondary | ICD-10-CM | POA: Diagnosis present

## 2016-11-16 DIAGNOSIS — Z7982 Long term (current) use of aspirin: Secondary | ICD-10-CM

## 2016-11-16 DIAGNOSIS — J90 Pleural effusion, not elsewhere classified: Secondary | ICD-10-CM | POA: Diagnosis not present

## 2016-11-16 DIAGNOSIS — E875 Hyperkalemia: Secondary | ICD-10-CM | POA: Diagnosis not present

## 2016-11-16 DIAGNOSIS — R05 Cough: Secondary | ICD-10-CM | POA: Diagnosis not present

## 2016-11-16 DIAGNOSIS — Z886 Allergy status to analgesic agent status: Secondary | ICD-10-CM

## 2016-11-16 DIAGNOSIS — M19042 Primary osteoarthritis, left hand: Secondary | ICD-10-CM | POA: Diagnosis present

## 2016-11-16 LAB — BASIC METABOLIC PANEL
ANION GAP: 15 (ref 5–15)
BUN: 17 mg/dL (ref 6–20)
CO2: 21 mmol/L — ABNORMAL LOW (ref 22–32)
Calcium: 9.1 mg/dL (ref 8.9–10.3)
Chloride: 97 mmol/L — ABNORMAL LOW (ref 101–111)
Creatinine, Ser: 4.91 mg/dL — ABNORMAL HIGH (ref 0.44–1.00)
GFR, EST AFRICAN AMERICAN: 10 mL/min — AB (ref >60)
GFR, EST NON AFRICAN AMERICAN: 9 mL/min — AB (ref >60)
GLUCOSE: 97 mg/dL (ref 65–99)
POTASSIUM: 5.7 mmol/L — AB (ref 3.5–5.1)
Sodium: 133 mmol/L — ABNORMAL LOW (ref 135–145)

## 2016-11-16 LAB — RESPIRATORY PANEL BY PCR
ADENOVIRUS-RVPPCR: NOT DETECTED
Bordetella pertussis: NOT DETECTED
CORONAVIRUS 229E-RVPPCR: NOT DETECTED
CORONAVIRUS HKU1-RVPPCR: NOT DETECTED
CORONAVIRUS NL63-RVPPCR: DETECTED — AB
CORONAVIRUS OC43-RVPPCR: NOT DETECTED
Chlamydophila pneumoniae: NOT DETECTED
Influenza A: NOT DETECTED
Influenza B: NOT DETECTED
METAPNEUMOVIRUS-RVPPCR: NOT DETECTED
MYCOPLASMA PNEUMONIAE-RVPPCR: NOT DETECTED
PARAINFLUENZA VIRUS 1-RVPPCR: NOT DETECTED
PARAINFLUENZA VIRUS 2-RVPPCR: NOT DETECTED
PARAINFLUENZA VIRUS 3-RVPPCR: NOT DETECTED
Parainfluenza Virus 4: NOT DETECTED
Respiratory Syncytial Virus: NOT DETECTED
Rhinovirus / Enterovirus: NOT DETECTED

## 2016-11-16 LAB — COMPREHENSIVE METABOLIC PANEL
ALK PHOS: 176 U/L — AB (ref 38–126)
ALT: 15 U/L (ref 14–54)
ANION GAP: 12 (ref 5–15)
AST: 50 U/L — ABNORMAL HIGH (ref 15–41)
Albumin: 2 g/dL — ABNORMAL LOW (ref 3.5–5.0)
BILIRUBIN TOTAL: 3.7 mg/dL — AB (ref 0.3–1.2)
BUN: 12 mg/dL (ref 6–20)
CALCIUM: 8.8 mg/dL — AB (ref 8.9–10.3)
CO2: 22 mmol/L (ref 22–32)
CREATININE: 4.22 mg/dL — AB (ref 0.44–1.00)
Chloride: 99 mmol/L — ABNORMAL LOW (ref 101–111)
GFR calc non Af Amer: 10 mL/min — ABNORMAL LOW (ref >60)
GFR, EST AFRICAN AMERICAN: 12 mL/min — AB (ref >60)
Glucose, Bld: 103 mg/dL — ABNORMAL HIGH (ref 65–99)
Potassium: 5.5 mmol/L — ABNORMAL HIGH (ref 3.5–5.1)
Sodium: 133 mmol/L — ABNORMAL LOW (ref 135–145)
TOTAL PROTEIN: 7.4 g/dL (ref 6.5–8.1)

## 2016-11-16 LAB — BLOOD GAS, ARTERIAL
Acid-base deficit: 2.8 mmol/L — ABNORMAL HIGH (ref 0.0–2.0)
Bicarbonate: 20.9 mmol/L (ref 20.0–28.0)
Drawn by: 23604
O2 Content: 2 L/min
O2 Saturation: 93.4 %
PCO2 ART: 33 mmHg (ref 32.0–48.0)
PH ART: 7.419 (ref 7.350–7.450)
Patient temperature: 99
pO2, Arterial: 72.3 mmHg — ABNORMAL LOW (ref 83.0–108.0)

## 2016-11-16 LAB — HEPARIN LEVEL (UNFRACTIONATED): HEPARIN UNFRACTIONATED: 0.15 [IU]/mL — AB (ref 0.30–0.70)

## 2016-11-16 LAB — I-STAT CG4 LACTIC ACID, ED: LACTIC ACID, VENOUS: 2.34 mmol/L — AB (ref 0.5–1.9)

## 2016-11-16 LAB — TROPONIN I
TROPONIN I: 0.42 ng/mL — AB (ref <0.03)
Troponin I: 0.51 ng/mL (ref <0.03)

## 2016-11-16 LAB — TSH: TSH: 0.043 u[IU]/mL — ABNORMAL LOW (ref 0.350–4.500)

## 2016-11-16 LAB — INFLUENZA PANEL BY PCR (TYPE A & B)
Influenza A By PCR: NEGATIVE
Influenza B By PCR: NEGATIVE

## 2016-11-16 LAB — CBC
HEMATOCRIT: 38.8 % (ref 36.0–46.0)
HEMOGLOBIN: 12.9 g/dL (ref 12.0–15.0)
MCH: 30.2 pg (ref 26.0–34.0)
MCHC: 33.2 g/dL (ref 30.0–36.0)
MCV: 90.9 fL (ref 78.0–100.0)
Platelets: 65 10*3/uL — ABNORMAL LOW (ref 150–400)
RBC: 4.27 MIL/uL (ref 3.87–5.11)
RDW: 17.5 % — ABNORMAL HIGH (ref 11.5–15.5)
WBC: 6.5 10*3/uL (ref 4.0–10.5)

## 2016-11-16 LAB — T4, FREE: Free T4: 1.36 ng/dL — ABNORMAL HIGH (ref 0.61–1.12)

## 2016-11-16 LAB — PROTIME-INR
INR: 1.28
Prothrombin Time: 15.9 seconds — ABNORMAL HIGH (ref 11.4–15.2)

## 2016-11-16 LAB — LACTIC ACID, PLASMA
LACTIC ACID, VENOUS: 3.9 mmol/L — AB (ref 0.5–1.9)
Lactic Acid, Venous: 2.2 mmol/L (ref 0.5–1.9)
Lactic Acid, Venous: 2.6 mmol/L (ref 0.5–1.9)
Lactic Acid, Venous: 4.3 mmol/L (ref 0.5–1.9)

## 2016-11-16 LAB — SAVE SMEAR

## 2016-11-16 LAB — GLUCOSE, CAPILLARY: GLUCOSE-CAPILLARY: 72 mg/dL (ref 65–99)

## 2016-11-16 LAB — LIPASE, BLOOD: Lipase: 18 U/L (ref 11–51)

## 2016-11-16 LAB — PROCALCITONIN: PROCALCITONIN: 0.6 ng/mL

## 2016-11-16 LAB — MAGNESIUM: Magnesium: 2 mg/dL (ref 1.7–2.4)

## 2016-11-16 MED ORDER — SODIUM POLYSTYRENE SULFONATE 15 GM/60ML PO SUSP
30.0000 g | Freq: Once | ORAL | Status: DC
  Filled 2016-11-16: qty 120

## 2016-11-16 MED ORDER — LEVOTHYROXINE SODIUM 75 MCG PO TABS: 175.0000 ug | ORAL_TABLET | Freq: Every day | ORAL | Status: DC

## 2016-11-16 MED ORDER — METHOCARBAMOL 500 MG PO TABS
250.0000 mg | ORAL_TABLET | Freq: Four times a day (QID) | ORAL | Status: DC | PRN
Start: 2016-11-16 — End: 2016-11-17

## 2016-11-16 MED ORDER — FENTANYL CITRATE (PF) 100 MCG/2ML IJ SOLN
12.5000 ug | INTRAMUSCULAR | Status: DC | PRN
  Administered 2016-11-16 – 2016-11-17 (×3): 12.5 ug via INTRAVENOUS
  Filled 2016-11-16 (×3): qty 2

## 2016-11-16 MED ORDER — MIDODRINE HCL 5 MG PO TABS
10.0000 mg | ORAL_TABLET | Freq: Three times a day (TID) | ORAL | Status: DC
  Filled 2016-11-16 (×3): qty 2

## 2016-11-16 MED ORDER — DEXTROSE 5 % IV SOLN: 1.0000 g | INTRAVENOUS | Status: DC

## 2016-11-16 MED ORDER — GABAPENTIN 300 MG PO CAPS: 300.0000 mg | ORAL_CAPSULE | Freq: Every day | ORAL | Status: DC

## 2016-11-16 MED ORDER — MORPHINE SULFATE (PF) 4 MG/ML IV SOLN: 1.0000 mg | Freq: Once | INTRAVENOUS | Status: DC

## 2016-11-16 MED ORDER — CALCIUM ACETATE (PHOS BINDER) 667 MG PO CAPS
667.0000 mg | ORAL_CAPSULE | Freq: Three times a day (TID) | ORAL | Status: DC
  Filled 2016-11-16 (×3): qty 1

## 2016-11-16 MED ORDER — NITROGLYCERIN 0.4 MG SL SUBL: 0.4000 mg | SUBLINGUAL_TABLET | SUBLINGUAL | Status: DC | PRN

## 2016-11-16 MED ORDER — WARFARIN SODIUM 4 MG PO TABS
4.0000 mg | ORAL_TABLET | Freq: Once | ORAL | Status: DC
  Filled 2016-11-16: qty 1

## 2016-11-16 MED ORDER — HEPARIN (PORCINE) IN NACL 100-0.45 UNIT/ML-% IJ SOLN
900.0000 [IU]/h | INTRAMUSCULAR | Status: DC
  Administered 2016-11-16: 800 [IU]/h via INTRAVENOUS
  Filled 2016-11-16: qty 250

## 2016-11-16 MED ORDER — DIGOXIN 0.25 MG/ML IJ SOLN
0.5000 mg | Freq: Once | INTRAMUSCULAR | Status: AC
  Administered 2016-11-16: 0.5 mg via INTRAVENOUS
  Filled 2016-11-16: qty 2

## 2016-11-16 MED ORDER — BOOST / RESOURCE BREEZE PO LIQD
1.0000 | Freq: Three times a day (TID) | ORAL | Status: DC
  Filled 2016-11-16: qty 1

## 2016-11-16 MED ORDER — SODIUM CHLORIDE 0.9 % IV BOLUS (SEPSIS)
500.0000 mL | Freq: Once | INTRAVENOUS | Status: AC
  Administered 2016-11-16: 500 mL via INTRAVENOUS

## 2016-11-16 MED ORDER — ASPIRIN EC 81 MG PO TBEC: 81.0000 mg | DELAYED_RELEASE_TABLET | Freq: Every day | ORAL | Status: DC

## 2016-11-16 MED ORDER — METOCLOPRAMIDE HCL 5 MG PO TABS
5.0000 mg | ORAL_TABLET | Freq: Two times a day (BID) | ORAL | Status: DC
  Filled 2016-11-16: qty 1

## 2016-11-16 MED ORDER — ACETAMINOPHEN 10 MG/ML IV SOLN
1000.0000 mg | Freq: Once | INTRAVENOUS | Status: AC
  Administered 2016-11-16: 1000 mg via INTRAVENOUS
  Filled 2016-11-16: qty 100

## 2016-11-16 MED ORDER — LACTULOSE 10 GM/15ML PO SOLN
30.0000 g | Freq: Three times a day (TID) | ORAL | Status: DC
  Filled 2016-11-16 (×2): qty 45

## 2016-11-16 MED ORDER — PRO-STAT SUGAR FREE PO LIQD
30.0000 mL | Freq: Two times a day (BID) | ORAL | Status: DC
  Filled 2016-11-16: qty 30

## 2016-11-16 MED ORDER — ALPRAZOLAM 0.5 MG PO TABS
0.5000 mg | ORAL_TABLET | Freq: Three times a day (TID) | ORAL | Status: DC | PRN
  Administered 2016-11-16: 0.5 mg via ORAL
  Filled 2016-11-16: qty 2

## 2016-11-16 MED ORDER — ADENOSINE 6 MG/2ML IV SOLN
INTRAVENOUS | Status: AC
  Filled 2016-11-16: qty 6

## 2016-11-16 MED ORDER — DEXTROSE 5 % IV SOLN
2.0000 g | Freq: Once | INTRAVENOUS | Status: DC
  Filled 2016-11-16: qty 2

## 2016-11-16 MED ORDER — OXYCODONE HCL 5 MG PO TABS
5.0000 mg | ORAL_TABLET | ORAL | Status: DC | PRN
  Filled 2016-11-16: qty 1

## 2016-11-16 MED ORDER — PIPERACILLIN-TAZOBACTAM 3.375 G IVPB
3.3750 g | Freq: Two times a day (BID) | INTRAVENOUS | Status: DC
  Administered 2016-11-16 – 2016-11-17 (×2): 3.375 g via INTRAVENOUS
  Filled 2016-11-16 (×3): qty 50

## 2016-11-16 MED ORDER — TACROLIMUS 1 MG PO CAPS
2.0000 mg | ORAL_CAPSULE | Freq: Two times a day (BID) | ORAL | Status: DC
  Filled 2016-11-16 (×3): qty 2

## 2016-11-16 MED ORDER — NICOTINE 21 MG/24HR TD PT24: 21.0000 mg | MEDICATED_PATCH | Freq: Every day | TRANSDERMAL | Status: DC

## 2016-11-16 MED ORDER — SODIUM CHLORIDE 0.9 % IV SOLN
INTRAVENOUS | Status: DC
  Administered 2016-11-16: 08:00:00 via INTRAVENOUS

## 2016-11-16 MED ORDER — METHYLPREDNISOLONE SODIUM SUCC 125 MG IJ SOLR
60.0000 mg | Freq: Four times a day (QID) | INTRAMUSCULAR | Status: DC
  Administered 2016-11-16 (×2): 60 mg via INTRAVENOUS
  Filled 2016-11-16 (×2): qty 2

## 2016-11-16 MED ORDER — ALBUTEROL SULFATE (2.5 MG/3ML) 0.083% IN NEBU
2.5000 mg | INHALATION_SOLUTION | RESPIRATORY_TRACT | Status: DC | PRN
  Administered 2016-11-16: 2.5 mg via RESPIRATORY_TRACT
  Filled 2016-11-16: qty 3

## 2016-11-16 MED ORDER — SODIUM CHLORIDE 0.9 % IV SOLN: 500.0000 mg | INTRAVENOUS | Status: DC

## 2016-11-16 MED ORDER — LORAZEPAM 2 MG/ML IJ SOLN
2.0000 mg | INTRAMUSCULAR | Status: DC | PRN
  Administered 2016-11-16 (×2): 1 mg via INTRAVENOUS
  Administered 2016-11-17 (×2): 2 mg via INTRAVENOUS
  Filled 2016-11-16 (×4): qty 1

## 2016-11-16 MED ORDER — TRAZODONE HCL 50 MG PO TABS: 50.0000 mg | ORAL_TABLET | Freq: Every day | ORAL | Status: DC

## 2016-11-16 MED ORDER — METOPROLOL TARTRATE 12.5 MG HALF TABLET: 12.5000 mg | ORAL_TABLET | Freq: Two times a day (BID) | ORAL | Status: DC

## 2016-11-16 MED ORDER — WARFARIN - PHARMACIST DOSING INPATIENT: Freq: Every day | Status: DC

## 2016-11-16 MED ORDER — CALCITRIOL 0.5 MCG PO CAPS: 0.5000 ug | ORAL_CAPSULE | ORAL | Status: DC

## 2016-11-16 MED ORDER — CALCIUM CARBONATE ANTACID 500 MG PO CHEW
1.0000 | CHEWABLE_TABLET | Freq: Every day | ORAL | Status: DC
  Filled 2016-11-16: qty 1

## 2016-11-16 MED ORDER — RENA-VITE PO TABS: 1.0000 | ORAL_TABLET | Freq: Every day | ORAL | Status: DC

## 2016-11-16 MED ORDER — TRAMADOL HCL 50 MG PO TABS: 50.0000 mg | ORAL_TABLET | Freq: Three times a day (TID) | ORAL | Status: DC | PRN

## 2016-11-16 MED ORDER — CARVEDILOL 3.125 MG PO TABS: 3.1250 mg | ORAL_TABLET | Freq: Two times a day (BID) | ORAL | Status: DC

## 2016-11-16 MED ORDER — VANCOMYCIN HCL IN DEXTROSE 1-5 GM/200ML-% IV SOLN
1000.0000 mg | Freq: Once | INTRAVENOUS | Status: AC
  Administered 2016-11-16: 1000 mg via INTRAVENOUS
  Filled 2016-11-16: qty 200

## 2016-11-16 MED ORDER — LEVOTHYROXINE SODIUM 100 MCG PO TABS
200.0000 ug | ORAL_TABLET | Freq: Every day | ORAL | Status: DC
  Filled 2016-11-16: qty 1

## 2016-11-16 MED ORDER — HYDRALAZINE HCL 20 MG/ML IJ SOLN: 5.0000 mg | INTRAMUSCULAR | Status: DC | PRN

## 2016-11-16 MED ORDER — CLINDAMYCIN PHOSPHATE 300 MG/50ML IV SOLN: 300.0000 mg | Freq: Once | INTRAVENOUS | Status: DC

## 2016-11-16 MED ORDER — DM-GUAIFENESIN ER 30-600 MG PO TB12: 1.0000 | ORAL_TABLET | Freq: Two times a day (BID) | ORAL | Status: DC | PRN

## 2016-11-16 MED ORDER — ONDANSETRON HCL 4 MG/2ML IJ SOLN
4.0000 mg | Freq: Three times a day (TID) | INTRAMUSCULAR | Status: DC | PRN
  Filled 2016-11-16: qty 2

## 2016-11-16 MED ORDER — MIRTAZAPINE 15 MG PO TABS: 15.0000 mg | ORAL_TABLET | Freq: Every day | ORAL | Status: DC

## 2016-11-16 MED ORDER — LIDOCAINE 5 % EX PTCH
1.0000 | MEDICATED_PATCH | CUTANEOUS | Status: DC
  Administered 2016-11-16: 1 via TRANSDERMAL
  Filled 2016-11-16 (×2): qty 1

## 2016-11-16 MED ORDER — ALBUTEROL SULFATE (2.5 MG/3ML) 0.083% IN NEBU
5.0000 mg | INHALATION_SOLUTION | Freq: Once | RESPIRATORY_TRACT | Status: AC
  Administered 2016-11-16: 5 mg via RESPIRATORY_TRACT
  Filled 2016-11-16: qty 6

## 2016-11-16 NOTE — Progress Notes (Signed)
Called to assess patient for SOB.  Patient has moderate increased WOB with RR 40-45 BPM.  BBS crackles/rhonchi.

## 2016-11-16 NOTE — Progress Notes (Signed)
11/11/2016 Patient transfer from emergency room to [facility] at 1442. Patient lungs have crackles. She came up from heparin from the emergency room. Patient have  right HD cath and a old graft on the left thigh. Old wound on buttock skin is pink. On abdomen look like a blister and scarring on the mid chest and some dark area on her abdomen. On the right stump old scarring. She was place on droplet and had MRSA swab on prior admission 10/30/2016. [facility] RN.

## 2016-11-16 NOTE — Progress Notes (Signed)
ANTICOAGULATION CONSULT NOTE - Initial Consult  Pharmacy Consult for heparin and warfarin Indication: AVR  Allergies  Allergen Reactions  . Acetaminophen Other (See Comments)    Liver transplant recipient   . Morphine And Related Itching  . Codeine Itching  . Mirtazapine Other (See Comments)    hallucination    Patient Measurements: Height: 5\' 4"  (162.6 cm) (prior to bka) Weight: 104 lb (47.2 kg) IBW/kg (Calculated) : 54.7  Vital Signs: Temp: 98.3 F (36.8 C) (10/07 0044) Temp Source: Oral (10/07 0044) BP: 99/72 (10/07 0530)  Labs:  Recent Labs  11/21/2016 0200  HGB 12.9  HCT 38.8  PLT 65*  LABPROT 15.9*  INR 1.28  CREATININE 4.22*    Estimated Creatinine Clearance: 10.3 mL/min (A) (by C-G formula based on SCr of 4.22 mg/dL (H)).   Medical History: Past Medical History:  Diagnosis Date  . Anemia    takes Folic Acid daily  . Anxiety   . Arthritis    "left hand, back" (08/30/2012)  . Asthma   . CAD (coronary artery disease) Jan. 2015   Cath: 20% LAD, 50% D1; s/p LIMA-LAD  . Chest pain 03/06/2015  . CHF (congestive heart failure) ([place]) 05/2016  . Chronic back pain   . Chronic constipation    takes MIralax and Colace daily  . COPD (chronic obstructive pulmonary disease) ([place])   . Depression    takes Cymbalta for "severe" depression  . ESRD on dialysis [facility])    "MWF; [facility]" (08/12/2016)  . GERD (gastroesophageal reflux disease)    takes Omeprazole daily  . Headache    "at least monthly" (08/30/2012)  . Hepatitis C   . History of blood transfusion    "several" (08/30/2012)  . Hypertension    takes Metoprolol and Lisinopril daily, sees Dr Bea Graff  . Hypothyroidism    takes Synthroid daily  . Migraine    "last migraine was in 2013" (08/30/2012)  . Myocardial infarction ([place])   . Neuromuscular disorder ([place])    carpal tunnel in right hand  . Obesity   . Peripheral vascular disease (H[facility]) hands and legs  . Pneumonia    "today and several times before"  (08/30/2012)  . 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 S/P bilateral BKA (below knee amputation) ([place]) 04/30/2016  . 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 SVT (supraventricular tachycardia) ([place]) 06/09/14  . Tobacco abuse   . Type II diabetes mellitus (HCC)    Levemir 2units daily if > 150    Assessment: 61yo female c/o SOB, started on ABX for PNA, to continue Coumadin and begin heparin bridge for AVR; current INR 1.28, was also this low at outpatient anticoag clinic visit ~5d ago and was to boost doses; of note pt's Plt are down to 65, 140 2wk ago.  Goal of Therapy:  Heparin level 0.3-0.7 units/ml INR 2.5-3.5 Monitor platelets by anticoagulation protocol: Yes   Plan:  Will start heparin gtt at 800 units/hr (was previously therapeutic at this rate) without bolus given Plt and monitor heparin levels and CBC.  Will give boosted Coumadin dose of 4mg  x1 today and monitor INR for dose adjustments.  Wynona Neat, PharmD, BCPS  12/07/2016,5:54 AM

## 2016-11-16 NOTE — ED Notes (Signed)
Dr. Quincy Simmonds on the phone about pt in SVT. HR 180.

## 2016-11-16 NOTE — ED Notes (Signed)
Pt called out stating she feels like she cant breathe. Pt having difficulty coughing, breath sounds wet. Pt encouraged to cough,d eep breathe. Pt repositioned on other side and positioned higher in bed. Pt states she feels better. Will hold off on anything by mouth.

## 2016-11-16 NOTE — Progress Notes (Signed)
Talked to daughter and patient of current status.  Talked to RT and RRT to come see patient due to sob, chest pain and RR>50's.

## 2016-11-16 NOTE — ED Notes (Signed)
After pills pt stating she cant stop coughing.

## 2016-11-16 NOTE — ED Notes (Signed)
Speech therapy at bedside to do swallow evaluation

## 2016-11-16 NOTE — ED Notes (Signed)
Blood cultures drtawn

## 2016-11-16 NOTE — ED Notes (Signed)
Per Dr. Quincy Simmonds, to DC blood draws, he was notified of lactic acid. He d/ced fluids as well. Dr. Quincy Simmonds aware of pt coughing, will be NPO now

## 2016-11-16 NOTE — Progress Notes (Signed)
Patient had increased work of breathing during report RR 40-50's.  Patient previously given Fentanyl 12.5 mcg from first shift.  I had e-link RN camera in when performing treatments.  Patient had RT breathing treatment, as well Sol-u-medrol 60 mg x two doses, Ativan 1 mg x two doses, 12 lead ekg performed, recheck of troponin and lactic acid which is now 4.3.  Rapid response team came up and has request an ABG.  And on-call MD mad aware of situation with page on another patient and that rapid was on their way.

## 2016-11-16 NOTE — Consult Note (Signed)
[facility] Renal Consultation Note    Indication for Consultation:  Management of ESRD/hemodialysis; anemia, hypertension/volume and secondary hyperparathyroidism PCP: Dr. Clovia Cuff  HPI: [name] is a 61 y.o. female with ESRD on hemodialysis MWF at [facility]. 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 Last HD 11/14/16.   Patient presented to ED early this AM with worsening cough and SOB. Sister says this started around Friday. Pt also C/O nausea and vomiting. Upon arrival to ED, she was afebrile and hypotensive BP 88/65. She rec'd 500cc NS.  WBC 6.5 HGB 12.9 INR 1.28 Lactic acid 2.34 K+ 5.5 Co2 22 Albumin 2.0 AST 50. Troponin 0.51. CXR showed R sided hydropneumothorax vs locuted pleural effusion. CT of chest showed Bilateral pleural effusions and basilar atelectasis. Prominent consolidation in the right perihilar and lower lung regions withfluid and debris in the right bronchus intermedius and right lower lobe bronchi consistent with aspiration pneumonia. No pneumothorax. She has been admitted for possible aspiration pneumonia and sepsis. She has rec'd vanc/zosyn in ED.   Seen in ED, patient appears acutely ill,  has tachypnea, minimally responsive. Sister at bedside but minimally able to contribute to HPI. Most of HPI obtained from EMR.   Past Medical History:  Diagnosis Date  . Anemia    takes Folic Acid daily  . Anxiety   . Arthritis    "left hand, back" (08/30/2012)  . Asthma   . CAD (coronary artery disease) Jan. 2015   Cath: 20% LAD, 50% D1; s/p LIMA-LAD  . Chest pain 03/06/2015  . CHF (congestive heart failure) ([place]) 05/2016  . Chronic back pain   . Chronic constipation    takes MIralax and Colace daily  . COPD (chronic obstructive pulmonary  disease) ([place])   . Depression    takes Cymbalta for "severe" depression  . ESRD on dialysis [facility])    "MWF; [facility]" (08/12/2016)  . GERD (gastroesophageal reflux disease)    takes Omeprazole daily  . Headache    "at least monthly" (08/30/2012)  . Hepatitis C   . History of blood transfusion    "several" (08/30/2012)  . Hypertension    takes Metoprolol and Lisinopril daily, sees Dr Bea Graff  . Hypothyroidism    takes Synthroid daily  . Migraine    "last migraine was in 2013" (08/30/2012)  . Myocardial infarction ([place])   . Neuromuscular disorder ([place])    carpal tunnel in right hand  . Obesity   . Peripheral vascular disease (HCC) hands and legs  . Pneumonia    "today and several times before" (08/30/2012)  . 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 S/P bilateral BKA (below knee amputation) ([place]) 04/30/2016  . 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 SVT (supraventricular tachycardia) ([place]) 06/09/14  . Tobacco abuse   . Type II diabetes mellitus (HCC)    Levemir 2units daily if > 150   Past Surgical History:  Procedure Laterality Date  . ABDOMINAL AORTOGRAM W/LOWER EXTREMITY N/A 07/04/2016   Procedure: Abdominal Aortogram w/Lower Extremity;  Surgeon: Elam Dutch, MD;  Location: [place] CV LAB;  Service: Cardiovascular;  Laterality: N/A;  . AMPUTATION Left 04/24/2016   Procedure: AMPUTATION BELOW  KNEE;  Surgeon: Angelia Mould, MD;  Location: [place];  Service: Vascular;  Laterality: Left;  . AMPUTATION Right 07/08/2016   Procedure: AMPUTATION BELOW KNEE;  Surgeon: Angelia Mould, MD;  Location: [place];  Service: Vascular;  Laterality: Right;  . AORTIC VALVE REPLACEMENT N/A 03/15/2013   AVR; Surgeon: Ivin Poot, MD;  Location: [facility] OR; Open Heart Surgery;  79mm[employer] mechanical prosthesis, top hat valve  . ARTERIOVENOUS GRAFT PLACEMENT Left 10/03/10    forearm  .  AV FISTULA PLACEMENT  01/29/2011   Procedure: INSERTION OF ARTERIOVENOUS (AV) GORE-TEX GRAFT ARM;  Surgeon: Elam Dutch, MD;  Location: [facility] OR;  Service: Vascular;  Laterality: Right;  . AV FISTULA PLACEMENT  03/10/2011   Procedure: INSERTION OF ARTERIOVENOUS (AV) GORE-TEX GRAFT THIGH;  Surgeon: Elam Dutch, MD;  Location: [place];  Service: Vascular;  Laterality: Left;  . AV FISTULA PLACEMENT Left 07/21/2016   Procedure: CREATION OF LEFT UPPER ARM ARTERIOVENOUS (AV) FISTULA;  Surgeon: Conrad Casselton, MD;  Location: [place];  Service: Vascular;  Laterality: Left;  . [place] REMOVAL  12/23/2010   Procedure: REMOVAL OF ARTERIOVENOUS GORETEX GRAFT ([place]);  Surgeon: Elam Dutch, MD;  Location: [place];  Service: Vascular;  Laterality: Left;  procedure started @1736 -1852  . CHOLECYSTECTOMY  1993  . CORONARY ANGIOGRAPHY N/A 08/12/2016   Procedure: CORONARY ANGIOGRAPHY (CATH LAB);  Surgeon: Leonie Man, MD;  Location: [place] CV LAB;  Service: Cardiovascular;  Laterality: N/A;  . CORONARY ARTERY BYPASS GRAFT N/A 03/15/2013   Procedure: CORONARY ARTERY BYPASS GRAFTING (CABG) times one using left internal mammary artery.;  Surgeon: Ivin Poot, MD;  Location: [place];  Service: Open Heart Surgery;  Laterality: N/A;  POSS CABG X 1  . CYSTOSCOPY  1990's  . INSERTION OF DIALYSIS CATHETER  12/23/2010   Procedure: INSERTION OF DIALYSIS CATHETER;  Surgeon: Elam Dutch, MD;  Location: [place];  Service: Vascular;  Laterality: Right;  Right Internal Jugular 28cm dialysis catheter insertion procedure time 1701-1720   . INSERTION OF DIALYSIS CATHETER Right 03/11/2016   Procedure: INSERTION OF DIALYSIS CATHETER;  Surgeon: Angelia Mould, MD;  Location: [place];  Service: Vascular;  Laterality: Right;  . INTRAOPERATIVE TRANSESOPHAGEAL ECHOCARDIOGRAM N/A 03/15/2013   Procedure: INTRAOPERATIVE TRANSESOPHAGEAL ECHOCARDIOGRAM;  Surgeon: Ivin Poot, MD;  Location: [place];  Service: Open Heart Surgery;   Laterality: N/A;  . LEFT HEART CATHETERIZATION WITH CORONARY ANGIOGRAM N/A 07/29/2012   Procedure: LEFT HEART CATHETERIZATION WITH CORONARY ANGIOGRAM;  Surgeon: Troy Sine, MD;  Location: [facility] CATH LAB;  Service: Cardiovascular;  Laterality: N/A;  . LEFT HEART CATHETERIZATION WITH CORONARY ANGIOGRAM N/A 03/10/2013   Procedure: LEFT HEART CATHETERIZATION WITH CORONARY ANGIOGRAM;  Surgeon: Troy Sine, MD;  Location: [facility] CATH LAB;  Service: Cardiovascular;  Laterality: N/A;  . LEFT HEART CATHETERIZATION WITH CORONARY/GRAFT ANGIOGRAM N/A 12/24/2011   Procedure: LEFT HEART CATHETERIZATION WITH Beatrix Fetters;  Surgeon: [name], MD;  Location: [facility] C[facility] LAB;  Service: Cardiovascular;  Laterality: N/A;  . LEFT HEART CATHETERIZATION WITH CORONARY/GRAFT ANGIOGRAM N/A 12/16/2013   Procedure: LEFT HEART CATHETERIZATION WITH Beatrix Fetters;  Surgeon: Troy Sine, MD;  Location: [facility] CATH LAB;  Service: Cardiovascular;  Laterality: N/A;  . left kidney removed    . LIGATION ARTERIOVENOUS GORTEX GRAFT Left 03/11/2016   Procedure: LIGATION THIGH ARTERIOVENOUS GORTEX GRAFT;  Surgeon: Angelia Mould, MD;  Location: [place];  Service: Vascular;  Laterality: Left;  . LIGATION OF  ARTERIOVENOUS  FISTULA Left 08/11/2016   Procedure: LIGATION OF ARTERIOVENOUS  FISTULA;  Surgeon: Elam Dutch, MD;  Location: [place];  Service: Vascular;  Laterality: Left;  . LIVER TRANSPLANT  10/25/2009   sees Dr Ferol Luz 1 every 6 months, saw last in Dec 2013. [name] Coord [phone]  . PERIPHERAL VASCULAR CATHETERIZATION N/A 11/06/2015   Procedure: Abdominal Aortogram;  Surgeon: Serafina Mitchell, MD;  Location: [place] CV LAB;  Service: Cardiovascular;  Laterality: N/A;  . PERIPHERAL VASCULAR CATHETERIZATION N/A 11/06/2015   Procedure: Lower Extremity Angiography;  Surgeon: Serafina Mitchell, MD;  Location: [place] CV LAB;  Service: Cardiovascular;  Laterality: N/A;  . PERIPHERAL VASCULAR  CATHETERIZATION  11/06/2015   Procedure: Peripheral Vascular Atherectomy;  Surgeon: Serafina Mitchell, MD;  Location: [place] CV LAB;  Service: Cardiovascular;;  Left Superficial femoral  . SHUNTOGRAM Left 05/15/2014   Procedure: SHUNTOGRAM;  Surgeon: Conrad Edgefield, MD;  Location: [facility] CATH LAB;  Service: Cardiovascular;  Laterality: Left;  . SMALL INTESTINE SURGERY  90's  . SPINAL GROWTH RODS  2010   "put 2 metal rods in my back; they had detetriorated" (08/30/2012)  . THROMBECTOMY    . THROMBECTOMY AND REVISION OF ARTERIOVENTOUS (AV) GORETEX  GRAFT Left 03/30/2014  . THROMBECTOMY AND REVISION OF ARTERIOVENTOUS (AV) GORETEX  GRAFT Left 03/30/2014   Procedure: THROMBECTOMY AND REVISION OF ARTERIOVENTOUS (AV) GORETEX  GRAFT;  Surgeon: Conrad Lamesa, MD;  Location: [place];  Service: Vascular;  Laterality: Left;  . TUBAL LIGATION  1990's   Family History  Problem Relation Age of Onset  . Cancer Mother   . Diabetes Mother   . Hypertension Mother   . Stroke Mother   . Cancer Father   . Anesthesia problems Neg Hx   . Hypotension Neg Hx   . Malignant hyperthermia Neg Hx   . Pseudochol deficiency Neg Hx    Social History:  reports that she has been smoking Cigarettes.  She has a 10.75 pack-year smoking history. She has never used smokeless tobacco. She reports that she does not drink alcohol or use drugs. Allergies  Allergen Reactions  . Acetaminophen Other (See Comments)    Liver transplant recipient   . Morphine And Related Itching  . Codeine Itching  . Mirtazapine Other (See Comments)    hallucination   Prior to Admission medications   Medication Sig Start Date End Date Taking? Authorizing Provider  ALPRAZolam Duanne Moron) 0.5 MG tablet Take 0.5 mg by mouth 3 (three) times daily as needed for anxiety.  08/26/16  Yes [provider]  Amino Acids-Protein Hydrolys (FEEDING SUPPLEMENT, PRO-STAT SUGAR FREE 64,) LIQD Take 30 mLs by mouth 2 (two) times daily. Patient taking differently: Take 30  mLs by mouth every Monday, Wednesday, and Friday.  08/28/16  Yes Lavina Hamman, MD  aspirin EC 81 MG tablet Take 81 mg by mouth daily.    Yes [provider]  benzonatate (TESSALON PERLES) 100 MG capsule Take 100 mg by mouth 3 (three) times daily as needed for cough.    Yes [provider]  calcitRIOL (ROCALTROL) 0.5 MCG capsule Take 1 capsule (0.5 mcg total) by mouth every Monday, Wednesday, and Friday with hemodialysis. 07/25/16  Yes Angiulli, Lavon Paganini, PA-C  calcium acetate (PHOSLO) 667 MG capsule Take 1 capsule (667 mg total) by mouth 3 (three) times daily with meals. 07/24/16  Yes Angiulli, Lavon Paganini, PA-C  calcium carbonate (TUMS - DOSED IN MG ELEMENTAL CALCIUM) 500 MG  chewable tablet Chew 1 tablet by mouth daily.   Yes [provider]  carvedilol (COREG) 3.125 MG tablet Take 3.125 mg by mouth 2 (two) times daily with a meal.  09/18/16  Yes [provider]  feeding supplement (BOOST / RESOURCE BREEZE) LIQD Take 1 Container by mouth 3 (three) times daily between meals. 04/25/16  Yes Reyne Dumas, MD  gabapentin (NEURONTIN) 300 MG capsule Take 300 mg by mouth at bedtime. Daughter reports patient taking 100 mg on hemodialysis days   Yes [provider]  glucose 4 GM chewable tablet Chew 4 g by mouth once as needed for low blood sugar.  12/27/15 12/26/16 Yes [provider]  lactulose (CHRONULAC) 10 GM/15ML solution Take 45 mLs (30 g total) by mouth 3 (three) times daily. 11/01/16  Yes Robbie Lis, MD  levofloxacin (LEVAQUIN) 750 MG tablet Take 750 mg by mouth daily. Started taking 11/15/16 for 5 days 11/15/16  Yes [provider]  levothyroxine (SYNTHROID, LEVOTHROID) 200 MCG tablet Take 1 tablet (200 mcg total) by mouth daily before breakfast. 10/03/16  Yes Barton Dubois, MD  lidocaine (LIDODERM) 5 % Place 1 patch onto the skin daily. Remove & Discard patch within 12 hours or as directed by MD 10/09/16  Yes Verlee Monte, MD  methocarbamol  (ROBAXIN) 500 MG tablet Take 0.5 tablets (250 mg total) by mouth every 6 (six) hours as needed for muscle spasms. 07/24/16  Yes Angiulli, Lavon Paganini, PA-C  metoCLOPramide (REGLAN) 5 MG tablet Take 1 tablet (5 mg total) by mouth 2 (two) times daily before a meal. Decrease to once a day before breakfast after a couple of weeks. Patient taking differently: Take 5 mg by mouth daily as needed for nausea or vomiting.  07/24/16  Yes Angiulli, Lavon Paganini, PA-C  metoprolol tartrate (LOPRESSOR) 25 MG tablet Take 0.5 tablets (12.5 mg total) by mouth 2 (two) times daily. 10/09/16  Yes Verlee Monte, MD  midodrine (PROAMATINE) 10 MG tablet Take 1 tablet (10 mg total) by mouth 3 (three) times daily with meals. 10/02/16  Yes Barton Dubois, MD  mirtazapine (REMERON) 15 MG tablet Take 15 mg by mouth at bedtime.  08/17/16  Yes [provider]  multivitamin (RENA-VIT) TABS tablet Take 1 tablet by mouth at bedtime. 05/20/16  Yes Love, Ivan Anchors, PA-C  nitroGLYCERIN (NITROSTAT) 0.4 MG SL tablet Place 1 tablet (0.4 mg total) under the tongue every 5 (five) minutes as needed for chest pain. 03/20/14  Yes Elgergawy, Silver Huguenin, MD  ondansetron (ZOFRAN ODT) 8 MG disintegrating tablet 8mg  ODT q4 hours prn nausea Patient taking differently: Take 8 mg by mouth every 4 (four) hours as needed for nausea or vomiting.  09/18/16  Yes Delo, Nathaneil Canary, MD  oxyCODONE (OXY IR/ROXICODONE) 5 MG immediate release tablet Take 1 tablet (5 mg total) by mouth every 4 (four) hours as needed for severe pain. 10/09/16  Yes Verlee Monte, MD  promethazine (PHENERGAN) 12.5 MG tablet Take 12.5 mg by mouth every 6 (six) hours as needed for nausea or vomiting.   Yes [provider]  tacrolimus (PROGRAF) 1 MG capsule Take 2 mg by mouth 2 (two) times daily.    Yes [provider]  traMADol (ULTRAM) 50 MG tablet Take 50 mg by mouth 3 (three) times daily as needed for moderate pain.   Yes [provider]  traZODone (DESYREL) 50 MG tablet  Take 50 mg by mouth at bedtime.   Yes [provider]  warfarin (COUMADIN) 2 MG  tablet Take 1-2 mg by mouth See admin instructions. Take 1/2 tablet on Monday, Wednesday and Friday then take 1 tablet all the other days   Yes [provider]   Current Facility-Administered Medications  Medication Dose Route Frequency Provider Last Rate Last Dose  . albuterol (PROVENTIL) (2.5 MG/3ML) 0.083% nebulizer solution 2.5 mg  2.5 mg Nebulization Q4H PRN Ivor Costa, MD      . ALPRAZolam Duanne Moron) tablet 0.5 mg  0.5 mg Oral TID PRN Ivor Costa, MD   0.5 mg at 11/15/2016 0911  . aspirin EC tablet 81 mg  81 mg Oral Daily Ivor Costa, MD      . Derrill Memo ON 2016-12-15] calcitRIOL (ROCALTROL) capsule 0.5 mcg  0.5 mcg Oral Q M,W,F-HD Ivor Costa, MD      . calcium acetate (PHOSLO) capsule 667 mg  667 mg Oral TID WC Ivor Costa, MD      . calcium carbonate (TUMS - dosed in mg elemental calcium) chewable tablet 200 mg of elemental calcium  1 tablet Oral Daily Ivor Costa, MD      . dextromethorphan-guaiFENesin ([place] DM) 30-600 MG per 12 hr tablet 1 tablet  1 tablet Oral BID PRN Ivor Costa, MD      . feeding supplement (BOOST / RESOURCE BREEZE) liquid 1 Container  1 Container Oral TID BM Ivor Costa, MD      . feeding supplement (PRO-STAT SUGAR FREE 64) liquid 30 mL  30 mL Oral BID Ivor Costa, MD      . gabapentin (NEURONTIN) capsule 300 mg  300 mg Oral QHS Ivor Costa, MD      . heparin ADULT infusion 100 units/mL (25000 units/258mL sodium chloride 0.45%)  800 Units/hr Intravenous Continuous Laren Everts, RPH 8 mL/hr at 12/02/2016 0814 800 Units/hr at 12/09/2016 0814  . hydrALAZINE (APRESOLINE) injection 5 mg  5 mg Intravenous Q2H PRN Ivor Costa, MD      . lactulose ([place]) 10 GM/15ML solution 30 g  30 g Oral TID Ivor Costa, MD      . levothyroxine (SYNTHROID, LEVOTHROID) tablet 200 mcg  200 mcg Oral QAC breakfast Ivor Costa, MD      . lidocaine (LIDODERM) 5 % 1 patch  1 patch Transdermal Q24H Ivor Costa, MD    1 patch at 11/22/2016 [phone]  . methocarbamol (ROBAXIN) tablet 250 mg  250 mg Oral Q6H PRN Ivor Costa, MD      . metoCLOPramide (REGLAN) tablet 5 mg  5 mg Oral BID AC Ivor Costa, MD      . metoprolol tartrate (LOPRESSOR) tablet 12.5 mg  12.5 mg Oral BID Ivor Costa, MD      . midodrine (PROAMATINE) tablet 10 mg  10 mg Oral TID WC Ivor Costa, MD      . mirtazapine (REMERON) tablet 15 mg  15 mg Oral QHS Ivor Costa, MD      . multivitamin (RENA-VIT) tablet 1 tablet  1 tablet Oral QHS Ivor Costa, MD      . nicotine (NICODERM CQ - dosed in mg/24 hours) patch 21 mg  21 mg Transdermal Daily Ivor Costa, MD   Stopped at 11/16/16 1000  . nitroGLYCERIN (NITROSTAT) SL tablet 0.4 mg  0.4 mg Sublingual Q5 min PRN Ivor Costa, MD      . ondansetron [facility]) injection 4 mg  4 mg Intravenous Q8H PRN Ivor Costa, MD      . oxyCODONE (Oxy IR/ROXICODONE) immediate release tablet 5 mg  5 mg Oral Q4H PRN Ivor Costa,  MD      . piperacillin-tazobactam (ZOSYN) IVPB 3.375 g  3.375 g Intravenous Q12H Honor Loh, [place]   Stopped at 11/22/2016 5784  . tacrolimus (PROGRAF) capsule 2 mg  2 mg Oral BID Ivor Costa, MD      . traMADol Veatrice Bourbon) tablet 50 mg  50 mg Oral TID PRN Ivor Costa, MD      . traZODone (DESYREL) tablet 50 mg  50 mg Oral QHS Ivor Costa, MD      . Derrill Memo ON December 14, 2016] vancomycin (VANCOCIN) 500 mg in sodium chloride 0.9 % 100 mL IVPB  500 mg Intravenous Q M,W,F-HD Bryk, Veronda P, RPH      . warfarin (COUMADIN) tablet 4 mg  4 mg Oral ONCE-1800 Laren Everts, RPH      . Warfarin - Pharmacist Dosing Inpatient   Does not apply q1800 Laren Everts, [place]       Current Outpatient Prescriptions  Medication Sig Dispense Refill  . ALPRAZolam (XANAX) 0.5 MG tablet Take 0.5 mg by mouth 3 (three) times daily as needed for anxiety.     . Amino Acids-Protein Hydrolys (FEEDING SUPPLEMENT, PRO-STAT SUGAR FREE 64,) LIQD Take 30 mLs by mouth 2 (two) times daily. (Patient taking differently: Take 30 mLs by mouth every Monday,  Wednesday, and Friday. ) 900 mL 0  . aspirin EC 81 MG tablet Take 81 mg by mouth daily.     . benzonatate (TESSALON PERLES) 100 MG capsule Take 100 mg by mouth 3 (three) times daily as needed for cough.     . calcitRIOL (ROCALTROL) 0.5 MCG capsule Take 1 capsule (0.5 mcg total) by mouth every Monday, Wednesday, and Friday with hemodialysis. 30 capsule 1  . calcium acetate (PHOSLO) 667 MG capsule Take 1 capsule (667 mg total) by mouth 3 (three) times daily with meals. 90 capsule 1  . calcium carbonate (TUMS - DOSED IN MG ELEMENTAL CALCIUM) 500 MG chewable tablet Chew 1 tablet by mouth daily.    . carvedilol (COREG) 3.125 MG tablet Take 3.125 mg by mouth 2 (two) times daily with a meal.     . feeding supplement (BOOST / RESOURCE BREEZE) LIQD Take 1 Container by mouth 3 (three) times daily between meals. 1 Container 100  . gabapentin (NEURONTIN) 300 MG capsule Take 300 mg by mouth at bedtime. Daughter reports patient taking 100 mg on hemodialysis days    . glucose 4 GM chewable tablet Chew 4 g by mouth once as needed for low blood sugar.     . lactulose (CHRONULAC) 10 GM/15ML solution Take 45 mLs (30 g total) by mouth 3 (three) times daily. 240 mL 0  . levofloxacin (LEVAQUIN) 750 MG tablet Take 750 mg by mouth daily. Started taking 11/15/16 for 5 days    . levothyroxine (SYNTHROID, LEVOTHROID) 200 MCG tablet Take 1 tablet (200 mcg total) by mouth daily before breakfast. 30 tablet 1  . lidocaine (LIDODERM) 5 % Place 1 patch onto the skin daily. Remove & Discard patch within 12 hours or as directed by MD 30 patch 0  . methocarbamol (ROBAXIN) 500 MG tablet Take 0.5 tablets (250 mg total) by mouth every 6 (six) hours as needed for muscle spasms. 60 tablet 0  . metoCLOPramide (REGLAN) 5 MG tablet Take 1 tablet (5 mg total) by mouth 2 (two) times daily before a meal. Decrease to once a day before breakfast after a couple of weeks. (Patient taking differently: Take 5 mg by mouth daily as needed for  nausea or  vomiting. ) 60 tablet 0  . metoprolol tartrate (LOPRESSOR) 25 MG tablet Take 0.5 tablets (12.5 mg total) by mouth 2 (two) times daily. 60 tablet 0  . midodrine (PROAMATINE) 10 MG tablet Take 1 tablet (10 mg total) by mouth 3 (three) times daily with meals. 90 tablet 1  . mirtazapine (REMERON) 15 MG tablet Take 15 mg by mouth at bedtime.   2  . multivitamin (RENA-VIT) TABS tablet Take 1 tablet by mouth at bedtime. 30 tablet 0  . nitroGLYCERIN (NITROSTAT) 0.4 MG SL tablet Place 1 tablet (0.4 mg total) under the tongue every 5 (five) minutes as needed for chest pain. 30 tablet 0  . ondansetron (ZOFRAN ODT) 8 MG disintegrating tablet 8mg  ODT q4 hours prn nausea (Patient taking differently: Take 8 mg by mouth every 4 (four) hours as needed for nausea or vomiting. ) 6 tablet 0  . oxyCODONE (OXY IR/ROXICODONE) 5 MG immediate release tablet Take 1 tablet (5 mg total) by mouth every 4 (four) hours as needed for severe pain. 20 tablet 0  . promethazine (PHENERGAN) 12.5 MG tablet Take 12.5 mg by mouth every 6 (six) hours as needed for nausea or vomiting.    . tacrolimus (PROGRAF) 1 MG capsule Take 2 mg by mouth 2 (two) times daily.     . traMADol (ULTRAM) 50 MG tablet Take 50 mg by mouth 3 (three) times daily as needed for moderate pain.    . traZODone (DESYREL) 50 MG tablet Take 50 mg by mouth at bedtime.    [name] warfarin (COUMADIN) 2 MG tablet Take 1-2 mg by mouth See admin instructions. Take 1/2 tablet on Monday, Wednesday and Friday then take 1 tablet all the other days     Labs: Basic Metabolic Panel:  Recent Labs Lab 11/15/2016 0200  NA 133*  K 5.5*  CL 99*  CO2 22  GLUCOSE 103*  BUN 12  CREATININE 4.22*  CALCIUM 8.8*   Liver Function Tests:  Recent Labs Lab 11/13/2016 0200  AST 50*  ALT 15  ALKPHOS 176*  BILITOT 3.7*  PROT 7.4  ALBUMIN 2.0*    Recent Labs Lab 12/08/2016 0625  LIPASE 18   No results for input(s): AMMONIA in the last 168 hours. CBC:  Recent Labs Lab 12/04/2016 0200   WBC 6.5  HGB 12.9  HCT 38.8  MCV 90.9  PLT 65*   Cardiac Enzymes:  Recent Labs Lab 12/03/2016 0625  TROPONINI 0.51*   CBG: No results for input(s): GLUCAP in the last 168 hours. Iron Studies: No results for input(s): IRON, TIBC, TRANSFERRIN, FERRITIN in the last 72 hours. Studies/Results: Dg Chest 2 View  Result Date: 12/08/2016 CLINICAL DATA:  Cough and shortness of breath EXAM: CHEST  2 VIEW COMPARISON:  Chest radiograph 10/29/2016 FINDINGS: Unchanged cardiomegaly. Right IJ dual-lumen catheter tip is in the proximal right atrium. There are bilateral pleural effusions, right larger than left, with a loculated lateral component on the right. I do not see lung markings extending to the lateral chest wall on the right. IMPRESSION: 1. Right-sided hydropneumothorax versus loculated pleural effusion. Chest CT is recommended for better characterization. 2. Small left pleural effusion with associated atelectasis. Electronically Signed   By: Ulyses Jarred M.D.   On: 12/01/2016 02:06   Ct Chest Wo Contrast  Result Date:  CLINICAL DATA:  Increasing shortness of breath. Productive cough since Friday. EXAM: CT CHEST WITHOUT CONTRAST TECHNIQUE: Multidetector CT imaging of the chest was performed following the standard protocol  without IV contrast. COMPARISON:  None. FINDINGS: Cardiovascular: Cardiac enlargement. Postoperative changes with aortic valve replacement and coronary bypass. Calcification in the native coronary arteries. No pericardial effusion. Calcification of the aorta. No aortic aneurysm. Central venous catheter with tip in the right atrium. Mediastinum/Nodes: Prominent lymph nodes throughout the mediastinum, with right paratracheal nodes measuring up to about 11 mm diameter. These are nonspecific but likely reactive. The esophagus is decompressed. Lungs/Pleura: Small left and moderate right pleural effusions. Atelectasis in the lung bases. Prominent airspace consolidation in the  right perihilar region and involving the right lower lobe. There is fluid and debris filling the right bronchus intermedius and right lower lobe bronchi. This likely represents aspiration pneumonia. No pneumothorax. Upper Abdomen: Limited visualization of the upper abdominal organs due to motion artifact and technique. There appears to be extensive vascular calcification throughout the abdomen. Possible right renal cysts suggestive of polycystic kidney. Musculoskeletal: Sternotomy wires.  No destructive bone lesions. IMPRESSION: 1. Bilateral pleural effusions and basilar atelectasis. Prominent consolidation in the right perihilar and lower lung regions with fluid and debris in the right bronchus intermedius and right lower lobe bronchi consistent with aspiration pneumonia. No pneumothorax. 2. Prominent cardiac enlargement. Previous aortic valve replacement and coronary bypass. 3. Prominent mediastinal lymph nodes are likely reactive. 4. Extensive vascular calcifications throughout the upper abdomen. Aortic Atherosclerosis (ICD10-I70.0). Electronically Signed   By: Lucienne Capers M.D.   On: 11/25/2016 04:29    ROS: As per HPI otherwise negative.    Physical Exam: Vitals:   12/03/2016 0915 11/15/2016 0921 12/08/2016 1015 11/10/2016 1115  BP: 118/79  115/73 113/78  Pulse: 89  88 84  Resp: 11  20 (!) 28  Temp:  98.1 F (36.7 C)    TempSrc:      SpO2: 97%  97% 98%  Weight:      Height:         General: Chronically ill appearing AA female in mild distress.  Head: Normocephalic, atraumatic, sclera non-icteric, mucus membranes are dry Neck: Supple. JVD not elevated. Lungs: Bilateral breath sounds decreased in bases bilaterally with diffuse scattered rhonchi throughout lung fields. RR 28-42 with mild WOB present.  Heart: RRR with S1 S2. No murmurs, rubs, or gallops appreciated. SR on monitor. Rate 90s.  Abdomen: Soft, non-tender, non-distended with normoactive bowel sounds. No rebound/guarding. No obvious  abdominal masses. Lower extremities:Bilateral BKA, no stump edema.  Neuro: Minimally responsive. Oriented X 2. Moves all extremities spontaneously. Psych:  As noted above, only grunts to questions for the most part.  Dialysis Access: RIJ TDC Drsg intact.   Dialysis Orders: [facility] MWF 3 hrs 45 min 180 NRe 450/Auto 1.5 45 kg 3.0 K/2.25 Ca linear Na -No Heparin   -Mircera 75 mcg IV q 2 weeks (last dose Mircera 100 mcg IV 11/05/16)   BMD meds: Calcium acetate 667 mg 1 cap PO TID (last Phos 4.7 11/05/16 Ca 8.8 )  Assessment/Plan: 1.  Sepsis/Possible Aspiration PNA: Per primary. Vanc/Zosyn. Per primary. Needs swallowing study.  2.  ESRD -  MWF. Does not appear volume overloaded. HD tomorrow on schedule. K+ 5.5 on adm.  3.  Hypotension/volume  -chronic hypotension on midodrine 10 mg PO TID. BP stable at present.  4.  Anemia  - HGB 12.9. No ESA ordered as OP.  5.  Metabolic bone disease -  Ca 8.8 C Ca 10.4. NPO at present. Change to Renvela 800 mg 2 tabs PO TID AC when able to eat. Use 2.0 Ca bath.  6.  Nutrition - NPO at present.  7. H/O MVR-INR subtherapeutic. Bridging heparin with coumadin until INR within acceptable range. 8. H/O Liver transplant-continuing tacrolimus for now. Per primary 9. Hypothyroidism-per primary 10. Thrombocytopenia-Plts 183 09/03/16. On low ESA as OP/coumadin. Per primary 11.  H/O CAD-cont metoprolol 12.5 mg PO BID. Did C/O pleuritic chest pain in ED but likely related to PNA.   Rita H. Owens Shark, NP-C 12/05/2016, 12:16 PM  [employer] [phone]  Pt seen, examined and agree w A/P as above. Chronically ill liver transplant ESRD patient here with acute/ severe 2-lobe R sided PNA.  Significant FTT and wt loss underlying.  14th admission this year. Palliative care knows this patient (2/18, 8/18) and could be called in for their assistance if pt not improving. Have d/w primary MD.  [name] MD [place] pager [phone]    11/29/2016, 2:00 PM

## 2016-11-16 NOTE — ED Notes (Signed)
Pt has active order for SLP swallow eval, on examination, pt has gurgling breath sounds. Drowsy. Will hold all PO medicines.

## 2016-11-16 NOTE — ED Notes (Signed)
Pt now in sinus tach. HR 120s

## 2016-11-16 NOTE — ED Notes (Signed)
[phone] [name], daughter would like to be notified when patient is going upstairs.

## 2016-11-16 NOTE — Evaluation (Signed)
Clinical/Bedside Swallow Evaluation Patient Details  Name: [name] MRN: [id number] Date of Birth: 06-23-1955  Today's Date: 11/21/2016 Time: SLP Start Time (ACUTE ONLY): 0845 SLP Stop Time (ACUTE ONLY): 0900 SLP Time Calculation (min) (ACUTE ONLY): 15 min  Past Medical History:  Past Medical History:  Diagnosis Date  . Anemia    takes Folic Acid daily  . Anxiety   . Arthritis    "left hand, back" (08/30/2012)  . Asthma   . CAD (coronary artery disease) Jan. 2015   Cath: 20% LAD, 50% D1; s/p LIMA-LAD  . Chest pain 03/06/2015  . CHF (congestive heart failure) ([place]) 05/2016  . Chronic back pain   . Chronic constipation    takes MIralax and Colace daily  . COPD (chronic obstructive pulmonary disease) ([place])   . Depression    takes Cymbalta for "severe" depression  . ESRD on dialysis [facility])    "MWF; [facility]" (08/12/2016)  . GERD (gastroesophageal reflux disease)    takes Omeprazole daily  . Headache    "at least monthly" (08/30/2012)  . Hepatitis C   . History of blood transfusion    "several" (08/30/2012)  . Hypertension    takes Metoprolol and Lisinopril daily, sees Dr Bea Graff  . Hypothyroidism    takes Synthroid daily  . Migraine    "last migraine was in 2013" (08/30/2012)  . Myocardial infarction ([place])   . Neuromuscular disorder ([place])    carpal tunnel in right hand  . Obesity   . Peripheral vascular disease (HCC) hands and legs  . Pneumonia    "today and several times before" (08/30/2012)  . 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 S/P bilateral BKA (below knee amputation) ([place]) 04/30/2016  . 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 SVT (supraventricular tachycardia) ([place]) 06/09/14  . Tobacco abuse   . Type II diabetes mellitus (HCC)    Levemir 2units daily if > 150   Past Surgical History:  Past Surgical History:  Procedure Laterality Date  . ABDOMINAL AORTOGRAM  W/LOWER EXTREMITY N/A 07/04/2016   Procedure: Abdominal Aortogram w/Lower Extremity;  Surgeon: Elam Dutch, MD;  Location: [place] CV LAB;  Service: Cardiovascular;  Laterality: N/A;  . AMPUTATION Left 04/24/2016   Procedure: AMPUTATION BELOW KNEE;  Surgeon: Angelia Mould, MD;  Location: [place];  Service: Vascular;  Laterality: Left;  . AMPUTATION Right 07/08/2016   Procedure: AMPUTATION BELOW KNEE;  Surgeon: Angelia Mould, MD;  Location: [place];  Service: Vascular;  Laterality: Right;  . AORTIC VALVE REPLACEMENT N/A 03/15/2013   AVR; Surgeon: Ivin Poot, MD;  Location: [facility] OR; Open Heart Surgery;  80mm[employer] mechanical prosthesis, top hat valve  . ARTERIOVENOUS GRAFT PLACEMENT Left 10/03/10    forearm  . AV FISTULA PLACEMENT  01/29/2011   Procedure: INSERTION OF ARTERIOVENOUS (AV) GORE-TEX GRAFT ARM;  Surgeon: Elam Dutch, MD;  Location: [facility] OR;  Service: Vascular;  Laterality: Right;  . AV FISTULA PLACEMENT  03/10/2011   Procedure: INSERTION OF ARTERIOVENOUS (AV) GORE-TEX GRAFT THIGH;  Surgeon: Elam Dutch, MD;  Location: [place];  Service: Vascular;  Laterality: Left;  . AV FISTULA PLACEMENT Left 07/21/2016   Procedure: CREATION OF LEFT UPPER ARM ARTERIOVENOUS (AV) FISTULA;  Surgeon: Conrad Council, MD;  Location: [place];  Service: Vascular;  Laterality: Left;  . [place] REMOVAL  12/23/2010  Procedure: REMOVAL OF ARTERIOVENOUS GORETEX GRAFT ([place]);  Surgeon: Elam Dutch, MD;  Location: [place];  Service: Vascular;  Laterality: Left;  procedure started @1736 -2585  . CHOLECYSTECTOMY  1993  . CORONARY ANGIOGRAPHY N/A 08/12/2016   Procedure: CORONARY ANGIOGRAPHY (CATH LAB);  Surgeon: Leonie Man, MD;  Location: [place] CV LAB;  Service: Cardiovascular;  Laterality: N/A;  . CORONARY ARTERY BYPASS GRAFT N/A 03/15/2013   Procedure: CORONARY ARTERY BYPASS GRAFTING (CABG) times one using left internal mammary artery.;  Surgeon: Ivin Poot, MD;  Location: [place];  Service: Open Heart Surgery;  Laterality: N/A;  POSS CABG X 1  . CYSTOSCOPY  1990's  . INSERTION OF DIALYSIS CATHETER  12/23/2010   Procedure: INSERTION OF DIALYSIS CATHETER;  Surgeon: Elam Dutch, MD;  Location: [place];  Service: Vascular;  Laterality: Right;  Right Internal Jugular 28cm dialysis catheter insertion procedure time 1701-1720   . INSERTION OF DIALYSIS CATHETER Right 03/11/2016   Procedure: INSERTION OF DIALYSIS CATHETER;  Surgeon: Angelia Mould, MD;  Location: [place];  Service: Vascular;  Laterality: Right;  . INTRAOPERATIVE TRANSESOPHAGEAL ECHOCARDIOGRAM N/A 03/15/2013   Procedure: INTRAOPERATIVE TRANSESOPHAGEAL ECHOCARDIOGRAM;  Surgeon: Ivin Poot, MD;  Location: [place];  Service: Open Heart Surgery;  Laterality: N/A;  . LEFT HEART CATHETERIZATION WITH CORONARY ANGIOGRAM N/A 07/29/2012   Procedure: LEFT HEART CATHETERIZATION WITH CORONARY ANGIOGRAM;  Surgeon: Troy Sine, MD;  Location: [facility] CATH LAB;  Service: Cardiovascular;  Laterality: N/A;  . LEFT HEART CATHETERIZATION WITH CORONARY ANGIOGRAM N/A 03/10/2013   Procedure: LEFT HEART CATHETERIZATION WITH CORONARY ANGIOGRAM;  Surgeon: Troy Sine, MD;  Location: [facility] CATH LAB;  Service: Cardiovascular;  Laterality: N/A;  . LEFT HEART CATHETERIZATION WITH CORONARY/GRAFT ANGIOGRAM N/A 12/24/2011   Procedure: LEFT HEART CATHETERIZATION WITH Beatrix Fetters;  Surgeon: [name], MD;  Location: [facility] CATH LAB;  Service: Cardiovascular;  Laterality: N/A;  . LEFT HEART CATHETERIZATION WITH CORONARY/GRAFT ANGIOGRAM N/A 12/16/2013   Procedure: LEFT HEART CATHETERIZATION WITH Beatrix Fetters;  Surgeon: Troy Sine, MD;  Location: [facility] CATH LAB;  Service: Cardiovascular;  Laterality: N/A;  . left kidney removed    . LIGATION ARTERIOVENOUS GORTEX GRAFT Left 03/11/2016   Procedure: LIGATION THIGH ARTERIOVENOUS GORTEX GRAFT;  Surgeon: Angelia Mould, MD;  Location: [place];  Service: Vascular;   Laterality: Left;  . LIGATION OF ARTERIOVENOUS  FISTULA Left 08/11/2016   Procedure: LIGATION OF ARTERIOVENOUS  FISTULA;  Surgeon: Elam Dutch, MD;  Location: [place];  Service: Vascular;  Laterality: Left;  . LIVER TRANSPLANT  10/25/2009   sees Dr Ferol Luz 1 every 6 months, saw last in Dec 2013. [name] Coord [phone]  . PERIPHERAL VASCULAR CATHETERIZATION N/A 11/06/2015   Procedure: Abdominal Aortogram;  Surgeon: Serafina Mitchell, MD;  Location: [place] CV LAB;  Service: Cardiovascular;  Laterality: N/A;  . PERIPHERAL VASCULAR CATHETERIZATION N/A 11/06/2015   Procedure: Lower Extremity Angiography;  Surgeon: Serafina Mitchell, MD;  Location: [place] CV LAB;  Service: Cardiovascular;  Laterality: N/A;  . PERIPHERAL VASCULAR CATHETERIZATION  11/06/2015   Procedure: Peripheral Vascular Atherectomy;  Surgeon: Serafina Mitchell, MD;  Location: [place] CV LAB;  Service: Cardiovascular;;  Left Superficial femoral  . SHUNTOGRAM Left 05/15/2014   Procedure: SHUNTOGRAM;  Surgeon: Conrad [place], MD;  Location: [facility] CATH LAB;  Service: Cardiovascular;  Laterality: Left;  . SMALL INTESTINE SURGERY  90's  . SPINAL GROWTH RODS  2010   "put 2 metal rods  in my back; they had detetriorated" (08/30/2012)  . THROMBECTOMY    . THROMBECTOMY AND REVISION OF ARTERIOVENTOUS (AV) GORETEX  GRAFT Left 03/30/2014  . THROMBECTOMY AND REVISION OF ARTERIOVENTOUS (AV) GORETEX  GRAFT Left 03/30/2014   Procedure: THROMBECTOMY AND REVISION OF ARTERIOVENTOUS (AV) GORETEX  GRAFT;  Surgeon: Conrad [place], MD;  Location: [place];  Service: Vascular;  Laterality: Left;  . 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  Dx pna/sepsis, possibly related to aspiration per H&P.  Chest CT - Lungs/Pleura: Small left and moderate right pleural effusions. Atelectasis in the lung bases. Prominent airspace consolidation in the right perihilar region and involving the right lower lobe. There is fluid and debris filling the right bronchus intermedius and right lower lobe bronchi. This likely represents aspiration pneumonia. No pneumothorax.   Assessment / Plan / Recommendation Clinical Impression  Pt presents with concerns for potential aspiration with notable decreased MS, increased respiratory effort and delayed cough when taxed with large, consecutive boluses of thin liquid.  No focal CN deficits.  For today, recommend maintaining NPO except ice chips after oral care; give meds whole with puree; allow small amounts puree if pt alert/participatory.  SLP will follow and determine necessity of instrumental swallow study prior to diet advancement.  D/W pt's daughter, Therapist, sports.  Pt still in ED but awaiting stepdown bed.  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  Dx pna/sepsis, possibly related to aspiration per H&P.  Chest CT - Lungs/Pleura: Small left and moderate right pleural effusions. Atelectasis in the lung bases. Prominent airspace consolidation in the right perihilar region and involving the right lower lobe. There is fluid and debris filling the right bronchus intermedius and right lower lobe bronchi. This likely represents aspiration pneumonia. No pneumothorax. Type of Study: Bedside Swallow Evaluation Previous Swallow Assessment: Bedside swallow evaluation Feb 2015 - no significant findings Diet Prior to this Study: NPO Respiratory Status: Increased respiratory rate;Nasal cannula History of Recent Intubation: No Behavior/Cognition: Lethargic/Drowsy Oral Cavity Assessment: Dry Oral Care Completed by SLP: No Oral Cavity - Dentition: Edentulous Vision: Functional for self-feeding Self-Feeding Abilities: Needs assist Patient Positioning: Upright in bed Baseline Vocal Quality: Normal Volitional Cough: Strong;Congested Volitional Swallow: Able to elicit    Oral/Motor/Sensory Function Overall Oral Motor/Sensory Function: Within functional limits   Ice Chips Ice chips: Within functional limits  Thin Liquid Thin Liquid: Impaired Presentation: Cup;Straw Pharyngeal  Phase Impairments: Cough - Delayed;Change in Vital Signs (increased RR )    Nectar Thick Nectar Thick Liquid: Not tested   Honey Thick Honey Thick Liquid: Not tested   Puree Puree: Within functional limits   Solid   GO   Solid: Not tested        Juan Quam Laurice 11/28/2016,9:15 AM  Estill Bamberg L. Tivis Ringer, [place] CCC/SLP Pager [phone]

## 2016-11-16 NOTE — ED Notes (Signed)
Admitting doctor at the bedside 

## 2016-11-16 NOTE — ED Notes (Signed)
Pt to xray

## 2016-11-16 NOTE — Significant Event (Signed)
Rapid Response Event Note  Overview: Time Called: 2018 Event Type: Respiratory  Initial Focused Assessment: Respiratory distress   Interventions: ABG requested  Plan of Care (if not transferred):  Event Summary: Called to assist with care of patient having respiratory distress. Hospital history was reviewed with unit RN. Patient is lethargic but response to light stimuli. Skin is warm and dry. Heart sounds regular. Airway is patent. Appears tachycapneic at rate of 50. Lungs with crackles bilateral.  Lactic acid increased to 4.3 from 2.6. Spoke to on-call provider about patients condition and family concerns. ABG was ordered per RRT protocol. We will continue to assist with care as needed.   Eino Farber [place]

## 2016-11-16 NOTE — Progress Notes (Signed)
[place] for heparin and warfarin Indication: AVR  Allergies  Allergen Reactions  . Acetaminophen Other (See Comments)    Liver transplant recipient   . Morphine And Related Itching  . Codeine Itching  . Mirtazapine Other (See Comments)    hallucination    Patient Measurements: Height: 5\' 4"  (162.6 cm) (prior to BKA) Weight: 81 lb (36.7 kg) IBW/kg (Calculated) : 54.7  Vital Signs: Temp: 100.9 F (38.3 C) (10/07 1732) Temp Source: Axillary (10/07 1732) BP: 135/92 (10/07 1732) Pulse Rate: 112 (10/07 1732)  Labs:  Recent Labs  11/12/2016 0200 11/22/2016 0625 11/19/2016 1808  HGB 12.9  --   --   HCT 38.8  --   --   PLT 65*  --   --   LABPROT 15.9*  --   --   INR 1.28  --   --   HEPARINUNFRC  --   --  0.15*  CREATININE 4.22*  --  4.91*  TROPONINI  --  0.51*  --     Estimated Creatinine Clearance: 6.9 mL/min (A) (by C-G formula based on SCr of 4.91 mg/dL (H)).   Medical History: Past Medical History:  Diagnosis Date  . Anemia    takes Folic Acid daily  . Anxiety   . Arthritis    "left hand, back" (08/30/2012)  . Asthma   . CAD (coronary artery disease) Jan. 2015   Cath: 20% LAD, 50% D1; s/p LIMA-LAD  . CHF (congestive heart failure) ([place]) 05/2016  . Chronic back pain   . Chronic constipation    takes MIralax and Colace daily  . COPD (chronic obstructive pulmonary disease) ([place])   . Depression    takes Cymbalta for "severe" depression  . ESRD on dialysis [facility])    "MWF; [facility]" (08/12/2016)  . GERD (gastroesophageal reflux disease)    takes Omeprazole daily  . Hepatitis C   . History of blood transfusion    "several" (08/30/2012)  . Hypertension    takes Metoprolol and Lisinopril daily, sees Dr Bea Graff  . Hypothyroidism    takes Synthroid daily  . Migraine    "last migraine was in 2013" (08/30/2012)  . Myocardial infarction ([place])   . Neuromuscular disorder ([place])    carpal tunnel in right hand  . Peripheral vascular  disease (HCC) hands and legs  . Pneumonia    "today and several times before" (08/30/2012)  . 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 S/P bilateral BKA (below knee amputation) ([place]) 04/30/2016  . 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 SVT (supraventricular tachycardia) ([place]) 06/09/14  . Tobacco abuse   . Type II diabetes mellitus (HCC)    Levemir 2units daily if > 150    Assessment: 61yo female c/o SOB, started on ABX for PNA, to continue Coumadin and begin heparin bridge for mechanical AVR; current INR 1.28. -Initial heparin level= 0.15, plt= 65, wt= 36kg    Goal of Therapy:  Heparin level 0.3-0.7 units/ml INR 2.5-3.5 Monitor platelets by anticoagulation protocol: Yes   Plan:  -No heparin bolus due to low platelets -Increase heparin to 900 units/hr -Heparin level in 6 hours and daily wth CBC daily  Hildred Laser, Pharm D 11/29/2016 7:24 PM

## 2016-11-16 NOTE — ED Triage Notes (Addendum)
Brought by EMS from home.  Called out for increased SOB.  Hx of COPD.  Dialysis pt.  Goes M/W/F.  Per family has not missed dialysis.  Patient reports having a productive cough with yellow/white thick phlegm since Friday.  Per daughter SOB started yesterday.  Family doctor called in antibiotic that she started yesterday and was told to bring to ER if not better.

## 2016-11-16 NOTE — Progress Notes (Signed)
Pharmacy Antibiotic Note  [name] is a 61 y.o. female admitted on 11/10/2016 with pneumonia.  Pharmacy has been consulted for vancomycin and zosyn dosing for possible aspiration PNA per CT report.   Patient with ESRD on HD MWF. Temp 98.3 and WBC within normal limits.   Plan: Vancomycin 1000mg  x1 then 500mg  IV with HD MWF.   Zosyn 3.375g IV q12hr Vancomycin trough at SS and PRN. Goal pre-HD level 15-25 mcg/mL. Monitor HD schedule, clinical picture, and culture data F/u length of therapy   Height: 5\' 4"  (162.6 cm) (prior to bka) Weight: 104 lb (47.2 kg) IBW/kg (Calculated) : 54.7  Temp (24hrs), Avg:98.3 F (36.8 C), Min:98.3 F (36.8 C), Max:98.3 F (36.8 C)   Recent Labs Lab 12/05/2016 0200  WBC 6.5  CREATININE 4.22*    Estimated Creatinine Clearance: 10.3 mL/min (A) (by C-G formula based on SCr of 4.22 mg/dL (H)).    Allergies  Allergen Reactions  . Acetaminophen Other (See Comments)    Liver transplant recipient   . Morphine And Related Itching  . Codeine Itching  . Mirtazapine Other (See Comments)    hallucination   Argie Ramming, PharmD Clinical Pharmacist 11/28/2016 4:59 AM

## 2016-11-16 NOTE — ED Notes (Signed)
Pt getting a hhn  Pulse 0x not picking up

## 2016-11-16 NOTE — Consult Note (Addendum)
CARDIOLOGY CONSULT NOTE  Patient ID: [name] MRN: [id number] DOB/AGE: 19-Dec-1955 61 y.o.  Admit date: 11/30/2016 Primary Physician Clovia Cuff, MD Primary Cardiologist  Dr. [name] Chief Complaint  Tachycardia.   Requesting    Dr. [name] Bernard  HPI:   [name] is a 61 y.o. female with a hx of coronary artery disease status post bypass, LIMA to LAD, mechanical aortic valve, bilateral BKA, multiple failed HD access, history of hepatitis C with liver transplant in 2011, COPD, below-knee amputations, depression, end-stage renal disease.  She came to the ED with respiratory distress and we are asked to see for tachycardia.    At last catheterization in July 2018 she was found to have a patent LIMA to LAD, but had significant LAD and diagonal diseaseproximal to the graft insertion,new occlusion of her OM2 and OM3 branches of her circumflex, and distal RCA disease involving the PDA and inferior LV branch of 90 and 80%.  She was in the hospital last month with chest pain.  She was managed medically for this.  She presented to the ED with acute respiratory distress.  She said that she had SOB with cough starting on Friday at dialysis.  She was coughing up yellow sputum.  She denies any fevers or chills.  She has chronic sharp chest pain.  I do not think that this was different than before.  She does not notice palpitations and did not notice that her heart was racing.  She had chest CT with findings consistent with possible aspiration pneumonia.  She was hypotensive and tachycardic.  Troponin was elevated as it has been with previous admissions.   12 lead demonstrated an SVT.  She received IV dig and did convert to sinus tachycardia.      Past Medical History:  Diagnosis Date  . Anemia    takes Folic Acid daily  . Anxiety   . Arthritis    "left hand, back" (08/30/2012)  . Asthma   . CAD (coronary artery disease) Jan. 2015   Cath: 20% LAD, 50% D1; s/p LIMA-LAD  . Chest pain 03/06/2015  .  CHF (congestive heart failure) ([place]) 05/2016  . Chronic back pain   . Chronic constipation    takes MIralax and Colace daily  . COPD (chronic obstructive pulmonary disease) ([place])   . Depression    takes Cymbalta for "severe" depression  . ESRD on dialysis [facility])    "MWF; [facility]" (08/12/2016)  . GERD (gastroesophageal reflux disease)    takes Omeprazole daily  . Headache    "at least monthly" (08/30/2012)  . Hepatitis C   . History of blood transfusion    "several" (08/30/2012)  . Hypertension    takes Metoprolol and Lisinopril daily, sees Dr Bea Graff  . Hypothyroidism    takes Synthroid daily  . Migraine    "last migraine was in 2013" (08/30/2012)  . Myocardial infarction ([place])   . Neuromuscular disorder ([place])    carpal tunnel in right hand  . Obesity   . Peripheral vascular disease (H[facility]) hands and legs  . Pneumonia    "today and several times before" (08/30/2012)  . 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 S/P bilateral BKA (below knee amputation) ([place]) 04/30/2016  . 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 SVT (supraventricular tachycardia) ([place]) 06/09/14  . Tobacco abuse   . Type  II diabetes mellitus ([place])    Levemir 2units daily if > 150    Past Surgical History:  Procedure Laterality Date  . ABDOMINAL AORTOGRAM W/LOWER EXTREMITY N/A 07/04/2016   Procedure: Abdominal Aortogram w/Lower Extremity;  Surgeon: Elam Dutch, MD;  Location: [place] CV LAB;  Service: Cardiovascular;  Laterality: N/A;  . AMPUTATION Left 04/24/2016   Procedure: AMPUTATION BELOW KNEE;  Surgeon: Angelia Mould, MD;  Location: [place];  Service: Vascular;  Laterality: Left;  . AMPUTATION Right 07/08/2016   Procedure: AMPUTATION BELOW KNEE;  Surgeon: Angelia Mould, MD;  Location: [place];  Service: Vascular;  Laterality: Right;  . AORTIC VALVE REPLACEMENT N/A 03/15/2013   AVR; Surgeon: Ivin Poot, MD;  Location: [facility] OR; Open Heart Surgery;  33mm[employer] mechanical prosthesis, top hat valve  . ARTERIOVENOUS GRAFT PLACEMENT Left 10/03/10    forearm  . AV FISTULA PLACEMENT  01/29/2011   Procedure: INSERTION OF ARTERIOVENOUS (AV) GORE-TEX GRAFT ARM;  Surgeon: Elam Dutch, MD;  Location: [facility] OR;  Service: Vascular;  Laterality: Right;  . AV FISTULA PLACEMENT  03/10/2011   Procedure: INSERTION OF ARTERIOVENOUS (AV) GORE-TEX GRAFT THIGH;  Surgeon: Elam Dutch, MD;  Location: [place];  Service: Vascular;  Laterality: Left;  . AV FISTULA PLACEMENT Left 07/21/2016   Procedure: CREATION OF LEFT UPPER ARM ARTERIOVENOUS (AV) FISTULA;  Surgeon: Conrad Union, MD;  Location: [place];  Service: Vascular;  Laterality: Left;  . [place] REMOVAL  12/23/2010   Procedure: REMOVAL OF ARTERIOVENOUS GORETEX GRAFT ([place]);  Surgeon: Elam Dutch, MD;  Location: [place];  Service: Vascular;  Laterality: Left;  procedure started @1736 -5427  . CHOLECYSTECTOMY  1993  . CORONARY ANGIOGRAPHY N/A 08/12/2016   Procedure: CORONARY ANGIOGRAPHY (CATH LAB);  Surgeon: Leonie Man, MD;  Location: [place] CV LAB;  Service: Cardiovascular;  Laterality: N/A;  . CORONARY ARTERY BYPASS GRAFT N/A 03/15/2013   Procedure: CORONARY ARTERY BYPASS GRAFTING (CABG) times one using left internal mammary artery.;  Surgeon: Ivin Poot, MD;  Location: [place];  Service: Open Heart Surgery;  Laterality: N/A;  POSS CABG X 1  . CYSTOSCOPY  1990's  . INSERTION OF DIALYSIS CATHETER  12/23/2010   Procedure: INSERTION OF DIALYSIS CATHETER;  Surgeon: Elam Dutch, MD;  Location: [place];  Service: Vascular;  Laterality: Right;  Right Internal Jugular 28cm dialysis catheter insertion procedure time 1701-1720   . INSERTION OF DIALYSIS CATHETER Right 03/11/2016   Procedure: INSERTION OF DIALYSIS CATHETER;  Surgeon: Angelia Mould, MD;  Location: [place];  Service: Vascular;  Laterality: Right;  . INTRAOPERATIVE TRANSESOPHAGEAL  ECHOCARDIOGRAM N/A 03/15/2013   Procedure: INTRAOPERATIVE TRANSESOPHAGEAL ECHOCARDIOGRAM;  Surgeon: Ivin Poot, MD;  Location: [place];  Service: Open Heart Surgery;  Laterality: N/A;  . LEFT HEART CATHETERIZATION WITH CORONARY ANGIOGRAM N/A 07/29/2012   Procedure: LEFT HEART CATHETERIZATION WITH CORONARY ANGIOGRAM;  Surgeon: Troy Sine, MD;  [facility];  Service: Cardiovascular;  Laterality: N/A;  . LEFT HEART CATHETERIZATION WITH CORONARY ANGIOGRAM N/A 03/10/2013   Procedure: LEFT HEART CATHETERIZATION WITH CORONARY ANGIOGRAM;  Surgeon: Troy Sine, MD;  Location: [facility] CATH LAB;  Service: Cardiovascular;  Laterality: N/A;  . LEFT HEART CATHETERIZATION WITH CORONARY/GRAFT ANGIOGRAM N/A 12/24/2011   Procedure: LEFT HEART CATHETERIZATION WITH Beatrix Fetters;  Surgeon: [name], MD;  Location: [facility] CATH LAB;  Service: Cardiovascular;  Laterality: N/A;  . LEFT HEART CATHETERIZATION WITH CORONARY/GRAFT ANGIOGRAM N/A 12/16/2013   Procedure:  LEFT HEART CATHETERIZATION WITH Beatrix Fetters;  Surgeon: Troy Sine, MD;  Location: [facility] CATH LAB;  Service: Cardiovascular;  Laterality: N/A;  . left kidney removed    . LIGATION ARTERIOVENOUS GORTEX GRAFT Left 03/11/2016   Procedure: LIGATION THIGH ARTERIOVENOUS GORTEX GRAFT;  Surgeon: Angelia Mould, MD;  Location: [place];  Service: Vascular;  Laterality: Left;  . LIGATION OF ARTERIOVENOUS  FISTULA Left 08/11/2016   Procedure: LIGATION OF ARTERIOVENOUS  FISTULA;  Surgeon: Elam Dutch, MD;  Location: [place];  Service: Vascular;  Laterality: Left;  . LIVER TRANSPLANT  10/25/2009   sees Dr Ferol Luz 1 every 6 months, saw last in Dec 2013. [name] Coord [phone]  . PERIPHERAL VASCULAR CATHETERIZATION N/A 11/06/2015   Procedure: Abdominal Aortogram;  Surgeon: Serafina Mitchell, MD;  Location: [place] CV LAB;  Service: Cardiovascular;  Laterality: N/A;  . PERIPHERAL VASCULAR CATHETERIZATION N/A 11/06/2015    Procedure: Lower Extremity Angiography;  Surgeon: Serafina Mitchell, MD;  Location: [place] CV LAB;  Service: Cardiovascular;  Laterality: N/A;  . PERIPHERAL VASCULAR CATHETERIZATION  11/06/2015   Procedure: Peripheral Vascular Atherectomy;  Surgeon: Serafina Mitchell, MD;  Location: [place] CV LAB;  Service: Cardiovascular;;  Left Superficial femoral  . SHUNTOGRAM Left 05/15/2014   Procedure: SHUNTOGRAM;  Surgeon: Conrad [place], MD;  Location: [facility] CATH LAB;  Service: Cardiovascular;  Laterality: Left;  . SMALL INTESTINE SURGERY  90's  . SPINAL GROWTH RODS  2010   "put 2 metal rods in my back; they had detetriorated" (08/30/2012)  . THROMBECTOMY    . THROMBECTOMY AND REVISION OF ARTERIOVENTOUS (AV) GORETEX  GRAFT Left 03/30/2014  . THROMBECTOMY AND REVISION OF ARTERIOVENTOUS (AV) GORETEX  GRAFT Left 03/30/2014   Procedure: THROMBECTOMY AND REVISION OF ARTERIOVENTOUS (AV) GORETEX  GRAFT;  Surgeon: Conrad [place], MD;  Location: [place];  Service: Vascular;  Laterality: Left;  . TUBAL LIGATION  1990's    Allergies  Allergen Reactions  . Acetaminophen Other (See Comments)    Liver transplant recipient   . Morphine And Related Itching  . Codeine Itching  . Mirtazapine Other (See Comments)    hallucination   Prior to Admission medications   Medication Sig Start Date End Date Taking? Authorizing Provider  ALPRAZolam Duanne Moron) 0.5 MG tablet Take 0.5 mg by mouth 3 (three) times daily as needed for anxiety.  08/26/16  Yes [provider]  Amino Acids-Protein Hydrolys (FEEDING SUPPLEMENT, PRO-STAT SUGAR FREE 64,) LIQD Take 30 mLs by mouth 2 (two) times daily. Patient taking differently: Take 30 mLs by mouth every Monday, Wednesday, and Friday.  08/28/16  Yes Lavina Hamman, MD  aspirin EC 81 MG tablet Take 81 mg by mouth daily.    Yes [provider]  benzonatate (TESSALON PERLES) 100 MG capsule Take 100 mg by mouth 3 (three) times daily as needed for cough.    Yes [provider]    calcitRIOL (ROCALTROL) 0.5 MCG capsule Take 1 capsule (0.5 mcg total) by mouth every Monday, Wednesday, and Friday with hemodialysis. 07/25/16  Yes Angiulli, Lavon Paganini, PA-C  calcium acetate (PHOSLO) 667 MG capsule Take 1 capsule (667 mg total) by mouth 3 (three) times daily with meals. 07/24/16  Yes Angiulli, Lavon Paganini, PA-C  calcium carbonate (TUMS - DOSED IN MG ELEMENTAL CALCIUM) 500 MG chewable tablet Chew 1 tablet by mouth daily.   Yes [provider]  carvedilol (COREG) 3.125 MG tablet Take 3.125 mg by mouth 2 (two) times daily  with a meal.  09/18/16  Yes [provider]  feeding supplement (BOOST / RESOURCE BREEZE) LIQD Take 1 Container by mouth 3 (three) times daily between meals. 04/25/16  Yes Reyne Dumas, MD  gabapentin (NEURONTIN) 300 MG capsule Take 300 mg by mouth at bedtime. Daughter reports patient taking 100 mg on hemodialysis days   Yes [provider]  glucose 4 GM chewable tablet Chew 4 g by mouth once as needed for low blood sugar.  12/27/15 12/26/16 Yes [provider]  lactulose (CHRONULAC) 10 GM/15ML solution Take 45 mLs (30 g total) by mouth 3 (three) times daily. 11/01/16  Yes Robbie Lis, MD  levofloxacin (LEVAQUIN) 750 MG tablet Take 750 mg by mouth daily. Started taking 11/15/16 for 5 days 11/15/16  Yes [provider]  levothyroxine (SYNTHROID, LEVOTHROID) 200 MCG tablet Take 1 tablet (200 mcg total) by mouth daily before breakfast. 10/03/16  Yes Barton Dubois, MD  lidocaine (LIDODERM) 5 % Place 1 patch onto the skin daily. Remove & Discard patch within 12 hours or as directed by MD 10/09/16  Yes Verlee Monte, MD  methocarbamol (ROBAXIN) 500 MG tablet Take 0.5 tablets (250 mg total) by mouth every 6 (six) hours as needed for muscle spasms. 07/24/16  Yes Angiulli, Lavon Paganini, PA-C  metoCLOPramide (REGLAN) 5 MG tablet Take 1 tablet (5 mg total) by mouth 2 (two) times daily before a meal. Decrease to once a day before breakfast after a  couple of weeks. Patient taking differently: Take 5 mg by mouth daily as needed for nausea or vomiting.  07/24/16  Yes Angiulli, Lavon Paganini, PA-C  metoprolol tartrate (LOPRESSOR) 25 MG tablet Take 0.5 tablets (12.5 mg total) by mouth 2 (two) times daily. 10/09/16  Yes Verlee Monte, MD  midodrine (PROAMATINE) 10 MG tablet Take 1 tablet (10 mg total) by mouth 3 (three) times daily with meals. 10/02/16  Yes Barton Dubois, MD  mirtazapine (REMERON) 15 MG tablet Take 15 mg by mouth at bedtime.  08/17/16  Yes [provider]  multivitamin (RENA-VIT) TABS tablet Take 1 tablet by mouth at bedtime. 05/20/16  Yes Love, Ivan Anchors, PA-C  nitroGLYCERIN (NITROSTAT) 0.4 MG SL tablet Place 1 tablet (0.4 mg total) under the tongue every 5 (five) minutes as needed for chest pain. 03/20/14  Yes Elgergawy, Silver Huguenin, MD  ondansetron (ZOFRAN ODT) 8 MG disintegrating tablet 8mg  ODT q4 hours prn nausea Patient taking differently: Take 8 mg by mouth every 4 (four) hours as needed for nausea or vomiting.  09/18/16  Yes Delo, Nathaneil Canary, MD  oxyCODONE (OXY IR/ROXICODONE) 5 MG immediate release tablet Take 1 tablet (5 mg total) by mouth every 4 (four) hours as needed for severe pain. 10/09/16  Yes Verlee Monte, MD  promethazine (PHENERGAN) 12.5 MG tablet Take 12.5 mg by mouth every 6 (six) hours as needed for nausea or vomiting.   Yes [provider]  tacrolimus (PROGRAF) 1 MG capsule Take 2 mg by mouth 2 (two) times daily.    Yes [provider]  traMADol (ULTRAM) 50 MG tablet Take 50 mg by mouth 3 (three) times daily as needed for moderate pain.   Yes [provider]  traZODone (DESYREL) 50 MG tablet Take 50 mg by mouth at bedtime.   Yes [provider]  warfarin (COUMADIN) 2 MG tablet Take 1-2 mg by mouth See admin instructions. Take 1/2 tablet on Monday, Wednesday and Friday then take 1 tablet all the other days   Yes [provider]     (Not in a hospital admission) Family History   Problem Relation Age of Onset  . Cancer Mother   . Diabetes Mother   . Hypertension Mother   . Stroke Mother   . Cancer Father   . Anesthesia problems Neg Hx   . Hypotension Neg Hx   . Malignant hyperthermia Neg Hx   . Pseudochol deficiency Neg Hx     Social History   Social History  . Marital status: Divorced    Spouse name: N/A  . Number of children: N/A  . Years of education: N/A   Occupational History  . disabled    Social History Main Topics  . Smoking status: Current Every Day Smoker    Packs/day: 0.25    Years: 43.00    Types: Cigarettes  . Smokeless tobacco: Never Used     Comment: 8/18 - down to <0.5 ppd  . Alcohol use No  . Drug use: No  . Sexual activity: No   Other Topics Concern  . Not on file   Social History Narrative  . No narrative on file     ROS:    As stated in the HPI and negative for all other systems.  Physical Exam: Blood pressure 115/81, pulse 94, temperature (!) 100.4 F (38 C), temperature source Rectal, resp. rate (!) 29, height 5\' 4"  (1.626 m), weight 104 lb (47.2 kg), SpO2 97 %.  GENERAL:  Very frail appearing and looks older than her stated age.   HEENT:  Pupils equal round and reactive, fundi not visualized, oral mucosa unremarkable NECK:  No jugular venous distention, waveform within normal limits, carotid upstroke brisk and symmetric, no bruits, no thyromegaly LYMPHATICS:  No cervical, inguinal adenopathy LUNGS:  Decreased breath sounds with coarse crackles.   BACK:  No CVA tenderness, dialysis catheter, right upper chest CHEST:  Unremarkable HEART:  PMI not displaced or sustained,S1 WNL, mechanical S2 within normal limits, no S3, no S4, no clicks, no rubs, apical early peaking systolic murmur, no diastolic murmurs ABD:  Flat, positive bowel sounds normal in frequency in pitch, no bruits, no rebound, no guarding, no midline pulsatile mass, no hepatomegaly, no splenomegaly EXT:  2 plus pulses throughout, no edema, no cyanosis no  clubbing, s/p bilateral BKAs and multiple clotted dialysis fistulas.   SKIN:  No rashes no nodules NEURO:  Cranial nerves II through XII grossly intact, motor grossly intact throughout PSYCH:  Cognitively intact, oriented to person place and time  Labs: Lab Results  Component Value Date   BUN 12 11/12/2016   Lab Results  Component Value Date   CREATININE 4.22 (H) 12/04/2016   Lab Results  Component Value Date   NA 133 (L) 11/18/2016   K 5.5 (H) 11/28/2016   CL 99 (L) 11/10/2016   CO2 22 12/01/2016   Lab Results  Component Value Date   TROPONINI 0.51 (HH) 11/12/2016   Lab Results  Component Value Date   WBC 6.5 11/18/2016   HGB 12.9 11/23/2016   HCT 38.8 12/08/2016   MCV 90.9 11/26/2016   PLT 65 (L) 11/29/2016   Lab Results  Component Value Date   CHOL 169 10/07/2016   HDL 33 (L) 10/07/2016   LDLCALC 119 (H) 10/07/2016   TRIG 86 10/07/2016   CHOLHDL 5.1 10/07/2016   Lab Results  Component Value Date   ALT 15 12/02/2016   AST 50 (H) 11/19/2016   ALKPHOS 176 (H) 12/02/2016   BILITOT 3.7 (H) 11/20/2016  Radiology:   CHEST CT: IMPRESSION: 1. Bilateral pleural effusions and basilar atelectasis. Prominent consolidation in the right perihilar and lower lung regions with fluid and debris in the right bronchus intermedius and right lower lobe bronchi consistent with aspiration pneumonia. No pneumothorax. 2. Prominent cardiac enlargement. Previous aortic valve replacement and coronary bypass. 3. Prominent mediastinal lymph nodes are likely reactive. 4. Extensive vascular calcifications throughout the upper abdomen.  EKG:   ASSESSMENT AND PLAN:   SVT:  Went back to NSR in ED.  BP does not allow significant med titration.  She was sent home from the last hospitalization on both metoprolol and Coreg.  I would suggest Coreg alone and med titration if/as we can.    MECHANICAL AVR:  Needs heparin bridge until INR is therapeutic.  She came in subtherapeutic.  I  spoke with Dr. Jonnie Finner because I do not see who is regulating her warfarin.  She indicates that it is nephrology but we are going to clarify this.  This needs to be clearly   CHEST PAIN:  Elevated troponin.  Continue medical management.  Possible demand ischemia.   SignedMinus Breeding 11/22/2016, 1:59 PM

## 2016-11-16 NOTE — ED Notes (Signed)
Pt drowsy, will hold off on oxy

## 2016-11-16 NOTE — Progress Notes (Signed)
12/08/2016 was unable to do patient  Admission, was very lethargic, spoke to daughter  At 1445 about completing patient admission. [facility] RN.

## 2016-11-16 NOTE — Progress Notes (Signed)
PROGRESS NOTE [facility]   LETRICIA KRINSKY   BZJ:[id number] DOB: 1955-08-25  DOA: 11/13/2016 PCP: Clovia Cuff, MD   Brief Narrative:  [name]  is a 61 y.o. 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 Patient was found to have RLL pneumonia. Admitted for further treatment.   Subjective: Patient seen and examined, she is very sick, c/o cough. No chest pain at this moments. Patient went into SVT.   Assessment & Plan: Sepsis 2/2 to aspiration PNA (RLL)  CT scan shows prominent consolidation in the right pre-iliac and lower lobe regions consistent with aspiration pneumonia. Agree with antibiotics, continue Cefepime and vancomycin for now. Pro-calcitonin not reliable on end stage renal disease patient so will not monitor. Respiratory panel positive for coronal virus. Continue nebulizer treatment, will add solumedrol. Follow up blood cultures. If fever ok to give tylenol. Patient did not pass swallow eval, may need modified barium swallow.   SVT - they have ordered a bolus of digoxin - patient converted back to normal sinus rhythm in the ED. Blood pressure does not allow significant medication titration Patient was on Coreg and metoprolol. Cardiology has been consulted and recommendations appreciated. We will continue to monitor  Mechanical AVR - patient on Coumadin, INR is supratherapeutic. We'll continue IV heparin to bridge. Warfarin per pharmacy   End-stage renal disease on HD  Mild hyperkalemic treated with IV fluid No signs of volume overload Hemodialysis per renal team  History of liver transplant -  LFT stable Continue tacrolimus There is no contraindication for Tylenol, do not use more than 2 g a day We'll continue to monitor  Status 2 sacral pressure ulcer Wound Care consult  CAD/Chest pain with elevated troponin - long standing hx - TNI chronically elevated - demand ischemia  Cardiology on board - recommending medical management   Hypotension - chronic patient on midodrine  Continue to monitor   Hypothyroidism  TSH low 0.043 Check Free T4 and adjust Synthroid  Check levels in 6 weeks   DVT prophylaxis: Heparin + Warfarin  Code Status: DNR/DNI  Family Communication: None at bedside  Disposition Plan: TBD   Consultants:   Cardiology   Nephrology   Procedures:   None   Antimicrobials: Anti-infectives    Start     Dose/Rate Route Frequency Ordered Stop   November 23, 2016 1200  vancomycin (VANCOCIN) 500 mg in sodium chloride 0.9 % 100 mL IVPB     500 mg 100 mL/hr over 60 Minutes Intravenous Every M-W-F (Hemodialysis) 11/14/2016 0351     23-Nov-2016 0600  ceFEPIme (MAXIPIME) 1 g in dextrose 5 % 50 mL IVPB  Status:  Discontinued     1 g 100 mL/hr over 30 Minutes Intravenous Every 24 hours 11/28/2016 0351 11/20/2016 0457   11/13/2016 0600  piperacillin-tazobactam (ZOSYN) IVPB 3.375 g     3.375 g 12.5 mL/hr over 240 Minutes Intravenous Every 12 hours 11/26/2016 0502     11/19/2016 0445  clindamycin (CLEOCIN) IVPB 300 mg  Status:  Discontinued     300 mg 100 mL/hr over 30 Minutes Intravenous  Once 11/20/2016 0436 11/13/2016 0457   11/28/2016 0400  vancomycin (VANCOCIN) IVPB 1000 mg/200 mL premix  1,000 mg 200 mL/hr over 60 Minutes Intravenous  Once 12/06/2016 0348 11/26/2016 0731   11/22/2016 0400  ceFEPIme (MAXIPIME) 2 g in dextrose 5 % 50 mL IVPB  Status:  Discontinued     2 g 100 mL/hr over 30 Minutes Intravenous  Once 11/30/2016 0348 11/27/2016 0457        Objective: Vitals:   11/30/2016 1350 12/09/2016 1356 11/15/2016 1400 12/08/2016 1442  BP: 115/81  125/89 (!) 134/99  Pulse:    (!) 112    Resp: (!) 36 (!) 29 15 (!) 35  Temp:    98.6 F (37 C)  TempSrc:    Oral  SpO2:      Weight:    36.7 kg (81 lb)  Height:    5\' 4"  (1.626 m)    Intake/Output Summary (Last 24 hours) at 11/23/2016 1622 Last data filed at 12/03/2016 1442  Gross per 24 hour  Intake           751.73 ml  Output                0 ml  Net           751.73 ml   Filed Weights   11/22/2016 0044 11/18/2016 1442  Weight: 47.2 kg (104 lb) 36.7 kg (81 lb)    Examination:  General exam: Mild distress, very frail and deconditioned   HEENT: OP moist, RIJ in place  Respiratory system: Decrease breath sound b/l, course and rales sound at the bibasilar Cardiovascular system: Tachycardia, S1S2, No JVD, + systolic murmur 2/6   Gastrointestinal system: Abdomen is nondistended, soft and nontender. Normal bowel sounds heard. Central nervous system: Oriented x 2. No focal neurological deficits. Extremities: B/L BKA's  Skin: No rashes, lesions or ulcers Psychiatry: Mood & affect appropriate flat/depressed   Data Reviewed: I have personally reviewed following labs and imaging studies  CBC:  Recent Labs Lab 11/20/2016 0200  WBC 6.5  HGB 12.9  HCT 38.8  MCV 90.9  PLT 65*   Basic Metabolic Panel:  Recent Labs Lab 11/27/2016 0200  NA 133*  K 5.5*  CL 99*  CO2 22  GLUCOSE 103*  BUN 12  CREATININE 4.22*  CALCIUM 8.8*   GFR: Estimated Creatinine Clearance: 8 mL/min (A) (by C-G formula based on SCr of 4.22 mg/dL (H)). Liver Function Tests:  Recent Labs Lab 11/25/2016 0200  AST 50*  ALT 15  ALKPHOS 176*  BILITOT 3.7*  PROT 7.4  ALBUMIN 2.0*    Recent Labs Lab 11/10/2016 0625  LIPASE 18   No results for input(s): AMMONIA in the last 168 hours. Coagulation Profile:  Recent Labs Lab 11/11/16 12/02/2016 0200  INR 1.3 1.28   Cardiac Enzymes:  Recent Labs Lab 11/21/2016 0625  TROPONINI 0.51*   BNP (last 3 results) No results for input(s): PROBNP in the last 8760 hours. HbA1C: No results for  input(s): HGBA1C in the last 72 hours. CBG: No results for input(s): GLUCAP in the last 168 hours. Lipid Profile: No results for input(s): CHOL, HDL, LDLCALC, TRIG, CHOLHDL, LDLDIRECT in the last 72 hours. Thyroid Function Tests:  Recent Labs  11/22/2016 0626  TSH 0.043*   Anemia Panel: No results for input(s): VITAMINB12, FOLATE, FERRITIN, TIBC, IRON, RETICCTPCT in the last 72 hours. Sepsis Labs:  Recent Labs Lab 11/13/2016 0555 11/16/16 0625 11/16/16 0921  PROCALCITON  --  0.60  --   LATICACIDVEN 2.34* 2.2* 2.6*    Recent Results (from the past 240 hour(s))  Respiratory  Panel by PCR     Status: Abnormal   Collection Time: 11/23/2016  8:20 AM  Result Value Ref Range Status   Adenovirus NOT DETECTED NOT DETECTED Final   Coronavirus 229E NOT DETECTED NOT DETECTED Final   Coronavirus HKU1 NOT DETECTED NOT DETECTED Final   Coronavirus NL63 DETECTED (A) NOT DETECTED Final   Coronavirus OC43 NOT DETECTED NOT DETECTED Final   Metapneumovirus NOT DETECTED NOT DETECTED Final   Rhinovirus / Enterovirus NOT DETECTED NOT DETECTED Final   Influenza A NOT DETECTED NOT DETECTED Final   Influenza B NOT DETECTED NOT DETECTED Final   Parainfluenza Virus 1 NOT DETECTED NOT DETECTED Final   Parainfluenza Virus 2 NOT DETECTED NOT DETECTED Final   Parainfluenza Virus 3 NOT DETECTED NOT DETECTED Final   Parainfluenza Virus 4 NOT DETECTED NOT DETECTED Final   Respiratory Syncytial Virus NOT DETECTED NOT DETECTED Final   Bordetella pertussis NOT DETECTED NOT DETECTED Final   Chlamydophila pneumoniae NOT DETECTED NOT DETECTED Final   Mycoplasma pneumoniae NOT DETECTED NOT DETECTED Final      Radiology Studies: Dg Chest 2 View  Result Date: 11/27/2016 CLINICAL DATA:  Cough and shortness of breath EXAM: CHEST  2 VIEW COMPARISON:  Chest radiograph 10/29/2016 FINDINGS: Unchanged cardiomegaly. Right IJ dual-lumen catheter tip is in the proximal right atrium. There are bilateral pleural effusions,  right larger than left, with a loculated lateral component on the right. I do not see lung markings extending to the lateral chest wall on the right. IMPRESSION: 1. Right-sided hydropneumothorax versus loculated pleural effusion. Chest CT is recommended for better characterization. 2. Small left pleural effusion with associated atelectasis. Electronically Signed   By: Ulyses Jarred M.D.   On: 12/03/2016 02:06   Ct Chest Wo Contrast  Result Date: 11/22/2016 CLINICAL DATA:  Increasing shortness of breath. Productive cough since Friday. EXAM: CT CHEST WITHOUT CONTRAST TECHNIQUE: Multidetector CT imaging of the chest was performed following the standard protocol without IV contrast. COMPARISON:  None. FINDINGS: Cardiovascular: Cardiac enlargement. Postoperative changes with aortic valve replacement and coronary bypass. Calcification in the native coronary arteries. No pericardial effusion. Calcification of the aorta. No aortic aneurysm. Central venous catheter with tip in the right atrium. Mediastinum/Nodes: Prominent lymph nodes throughout the mediastinum, with right paratracheal nodes measuring up to about 11 mm diameter. These are nonspecific but likely reactive. The esophagus is decompressed. Lungs/Pleura: Small left and moderate right pleural effusions. Atelectasis in the lung bases. Prominent airspace consolidation in the right perihilar region and involving the right lower lobe. There is fluid and debris filling the right bronchus intermedius and right lower lobe bronchi. This likely represents aspiration pneumonia. No pneumothorax. Upper Abdomen: Limited visualization of the upper abdominal organs due to motion artifact and technique. There appears to be extensive vascular calcification throughout the abdomen. Possible right renal cysts suggestive of polycystic kidney. Musculoskeletal: Sternotomy wires.  No destructive bone lesions. IMPRESSION: 1. Bilateral pleural effusions and basilar atelectasis.  Prominent consolidation in the right perihilar and lower lung regions with fluid and debris in the right bronchus intermedius and right lower lobe bronchi consistent with aspiration pneumonia. No pneumothorax. 2. Prominent cardiac enlargement. Previous aortic valve replacement and coronary bypass. 3. Prominent mediastinal lymph nodes are likely reactive. 4. Extensive vascular calcifications throughout the upper abdomen. Aortic Atherosclerosis (ICD10-I70.0). Electronically Signed   By: Lucienne Capers M.D.   On: 11/28/2016 04:29      Scheduled Meds: . aspirin EC  81 mg Oral Daily  . carvedilol  3.125 mg Oral BID WC  . feeding supplement  1 Container Oral TID BM  . feeding supplement (PRO-STAT SUGAR FREE 64)  30 mL Oral BID  . gabapentin  300 mg Oral QHS  . lactulose  30 g Oral TID  . levothyroxine  200 mcg Oral QAC breakfast  . lidocaine  1 patch Transdermal Q24H  . metoCLOPramide  5 mg Oral BID AC  . midodrine  10 mg Oral TID WC  . mirtazapine  15 mg Oral QHS  . multivitamin  1 tablet Oral QHS  . nicotine  21 mg Transdermal Daily  . tacrolimus  2 mg Oral BID  . traZODone  50 mg Oral QHS  . warfarin  4 mg Oral ONCE-1800  . Warfarin - Pharmacist Dosing Inpatient   Does not apply q1800   Continuous Infusions: . acetaminophen    . heparin 800 Units/hr (12/06/2016 1442)  . piperacillin-tazobactam (ZOSYN)  IV Stopped (11/26/2016 1007)  . [START ON 12/07/16] vancomycin       LOS: 0 days   Critical care time 55 minutes   Time spent: Total of 55 minutes spent with pt, greater than 50% of which was spent in discussion of  treatment, counseling and coordination of care   Chipper Oman, MD Pager: Text Page via [email or web address]   If 7PM-7AM, please contact night-coverage [email or web address] 12/02/2016, 4:22 PM

## 2016-11-16 NOTE — ED Notes (Signed)
Trop 0.51 , critical notification. MD paged.

## 2016-11-16 NOTE — ED Notes (Signed)
Pt repositined in bed, encouraged to cough. Pressure pad on sacral area removed, and changed.

## 2016-11-16 NOTE — Progress Notes (Signed)
12/04/2016 Swot nurse went into patient room at 61 primary Rn was told patient Respiratory rate was in the 50's. Dr Quincy Simmonds was made aware. Patient receive respiratory treatment at 1728, and receive schedule Solu-Medrol at 1730. Patient continue in the 30's to the 40 and patient had work or breathing. Dr Quincy Simmonds continue to be inform by swot and primary nurse. Denise from [facility] was called at 1830 to assess patient. [facility] RN.

## 2016-11-16 NOTE — ED Notes (Signed)
Pt going to ct  

## 2016-11-16 NOTE — ED Provider Notes (Signed)
[facility] DEPT Provider Note   CSN: [id number] Arrival date & time: 11/16/2016  0029     History   Chief Complaint Chief Complaint  Patient presents with  . Shortness of Breath    HPI [name] is a 61 y.o. female.  HPI Patient is a 61 year old female with a history of end-stage renal disease on dialysis.  She dialyzes Monday Wednesday Friday.  She's had normal dialysis sessions this week including Friday.  She presents with a bout 44-36 hours of worsening cough and shortness of breath.  She was called an antibiotic and started yesterday but was brought to the ER today because of ongoing worsening symptoms.  Increasing shortness of breath.  Chills at home without documented fever.  No blood in her cough.  No vomiting.  No abdominal pain.  No diarrhea.  Symptoms are moderate in severity   Past Medical History:  Diagnosis Date  . Anemia    takes Folic Acid daily  . Anxiety   . Arthritis    "left hand, back" (08/30/2012)  . Asthma   . CAD (coronary artery disease) Jan. 2015   Cath: 20% LAD, 50% D1; s/p LIMA-LAD  . Chest pain 03/06/2015  . CHF (congestive heart failure) ([place]) 05/2016  . Chronic back pain   . Chronic constipation    takes MIralax and Colace daily  . COPD (chronic obstructive pulmonary disease) ([place])   . Depression    takes Cymbalta for "severe" depression  . ESRD on dialysis [facility])    "MWF; [facility]" (08/12/2016)  . GERD (gastroesophageal reflux disease)    takes Omeprazole daily  . Headache    "at least monthly" (08/30/2012)  . Hepatitis C   . History of blood transfusion    "several" (08/30/2012)  . Hypertension    takes Metoprolol and Lisinopril daily, sees Dr Bea Graff  . Hypothyroidism    takes Synthroid daily  . Migraine    "last migraine was in 2013" (08/30/2012)  . Myocardial infarction ([place])   . Neuromuscular disorder ([place])    carpal tunnel in right hand  . Obesity   . Peripheral vascular disease (HCC) hands and legs  . Pneumonia    "today and several times before" (08/30/2012)  . 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 S/P bilateral BKA (below knee amputation) ([place]) 04/30/2016  . 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 SVT (supraventricular tachycardia) ([place]) 06/09/14  . Tobacco abuse   . Type II diabetes mellitus (HCC)    Levemir 2units daily if > 150    Patient Active Problem List   Diagnosis Date Noted  . Chest pain 10/29/2016  . NSVT (nonsustained ventricular tachycardia) ([place])   . Hyponatremia 10/07/2016  . NSTEMI (non-ST elevated myocardial infarction) ([place])   . Acute encephalopathy 09/29/2016  . Syncope 09/28/2016  . ESRD (end stage renal disease) ([place]) 08/28/2016  . Protein-calorie malnutrition, severe 08/13/2016  . Acute CHF (congestive heart failure) ([place]) 08/09/2016  . Fluid overload 07/31/2016  . SOB (shortness of breath)   . Type 2 diabetes mellitus with peripheral neuropathy (HCC)   . H/O mitral valve replacement with mechanical valve   . Amputation of right lower extremity below knee ([place]) 07/10/2016  . History of liver transplant ([place])   . Post-operative pain   . S/P bilateral BKA (below knee amputation) ([place])   . Malnutrition of  moderate degree 07/01/2016  . Cellulitis 06/30/2016  . PAD (peripheral artery disease) ([place]) 06/30/2016  . Diabetic foot infection ([place]) 06/30/2016  . Pressure injury of skin 05/28/2016  . Volume overload 05/28/2016  . Acute exacerbation of CHF (congestive heart failure) ([place]) 05/27/2016  . Extremity atherosclerosis with resting pain ([place])   . H/O heart valve replacement with mechanical valve   . Phantom limb pain ([place])   . Subtherapeutic international normalized ratio (INR)   . Supratherapeutic INR   . Anemia of chronic disease   . Chronic midline low back pain without sciatica   . Type 2 diabetes mellitus with complication, with long-term current use of insulin  ([place])   . S/P unilateral BKA (below knee amputation), left ([place]) 04/30/2016  . Abnormality of gait   . Coronary artery disease involving coronary bypass graft of native heart without angina pectoris   . Benign essential HTN   . History of migraine   . Leukocytosis   . Critical lower limb ischemia 04/22/2016  . Foot pain 04/21/2016  . Fever   . Advance care planning   . Goals of care, counseling/discussion   . Palliative care by specialist   . Sepsis ([place])   . Hypotension   . Claudication of left lower extremity ([place])   . Elevated troponin 03/05/2016  . Pressure injury of skin, stage 2 11/27/2015  . H/O unilateral nephrectomy 07/06/2015  . Paroxysmal SVT (supraventricular tachycardia) ([place]) 06/22/2014  . Complications due to renal dialysis device, implant, and graft 05/11/2014  . Acute blood loss anemia 03/31/2014  . Renal dialysis device, implant, or graft complication 45/80/9983  . Acute hyperkalemia   . S/P liver transplant ([place])   . Chronic hepatitis C without hepatic coma ([place])   . Pulmonary edema 01/03/2014  . Respiratory failure ([place]) 01/03/2014  . Long term current use of anticoagulant therapy 04/22/2013  . Chronic anticoagulation w/ coumadin, goal INR 2.5-3.0 w/ AVR 04/14/2013  . Tobacco abuse 02/28/2013  . COPD (chronic obstructive pulmonary disease) ([place]) 02/21/2013  . Chronic combined systolic and diastolic congestive heart failure ([place]) 02/06/2013  . Acute on chronic respiratory failure with hypoxia ([place]) 11/02/2012  . Acute pulmonary edema ([place]) 11/02/2012  . Acute on chronic systolic CHF (congestive heart failure) ([place]) 11/02/2012  . Dyslipidemia-LDL 104, not on statin with Hx of liver transplant 07/30/2012  . CAD (coronary artery disease), native coronary artery 07/27/2012  . History of prosthetic aortic valve replacement   . End stage renal disease on dialysis ([place]) 02/27/2011  . Hypothyroidism 06/25/2006  . Type 2 diabetes mellitus with chronic kidney disease  ([place]) 06/25/2006  . GERD 06/25/2006  . Anemia due to chronic renal failure    . Hypertensive heart disease     Past Surgical History:  Procedure Laterality Date  . ABDOMINAL AORTOGRAM W/LOWER EXTREMITY N/A 07/04/2016   Procedure: Abdominal Aortogram w/Lower Extremity;  Surgeon: Elam Dutch, MD;  Location: [facility];  Service: Cardiovascular;  Laterality: N/A;  . AMPUTATION Left 04/24/2016   Procedure: AMPUTATION BELOW KNEE;  Surgeon: Angelia Mould, MD;  Location: [facility];  Service: Vascular;  Laterality: Left;  . AMPUTATION Right 07/08/2016   Procedure: AMPUTATION BELOW KNEE;  Surgeon: Angelia Mould, MD;  Location: [facility];  Service: Vascular;  Laterality: Right;  . AORTIC VALVE REPLACEMENT N/A 03/15/2013   AVR; Surgeon: Ivin Poot, MD;  Location: [facility] OR; Open Heart Surgery;  38mm[employer] mechanical prosthesis, top hat valve  . ARTERIOVENOUS GRAFT PLACEMENT Left  10/03/10    forearm  . AV FISTULA PLACEMENT  01/29/2011   Procedure: INSERTION OF ARTERIOVENOUS (AV) GORE-TEX GRAFT ARM;  Surgeon: Elam Dutch, MD;  Location: [facility] OR;  Service: Vascular;  Laterality: Right;  . AV FISTULA PLACEMENT  03/10/2011   Procedure: INSERTION OF ARTERIOVENOUS (AV) GORE-TEX GRAFT THIGH;  Surgeon: Elam Dutch, MD;  Location: [facility];  Service: Vascular;  Laterality: Left;  . AV FISTULA PLACEMENT Left 07/21/2016   Procedure: CREATION OF LEFT UPPER ARM ARTERIOVENOUS (AV) FISTULA;  Surgeon: Conrad Schertz, MD;  Location: [facility];  Service: Vascular;  Laterality: Left;  . [place] REMOVAL  12/23/2010   Procedure: REMOVAL OF ARTERIOVENOUS GORETEX GRAFT ([place]);  Surgeon: Elam Dutch, MD;  Location: [facility];  Service: Vascular;  Laterality: Left;  procedure started @1736 -1852  . CHOLECYSTECTOMY  1993  . CORONARY ANGIOGRAPHY N/A 08/12/2016   Procedure: CORONARY ANGIOGRAPHY (CATH LAB);  Surgeon: Leonie Man, MD;  Location: [facility];  Service: Cardiovascular;  Laterality: N/A;   . CORONARY ARTERY BYPASS GRAFT N/A 03/15/2013   Procedure: CORONARY ARTERY BYPASS GRAFTING (CABG) times one using left internal mammary artery.;  Surgeon: Ivin Poot, MD;  Location: [facility];  Service: Open Heart Surgery;  Laterality: N/A;  POSS CABG X 1  . CYSTOSCOPY  1990's  . INSERTION OF DIALYSIS CATHETER  12/23/2010   Procedure: INSERTION OF DIALYSIS CATHETER;  Surgeon: Elam Dutch, MD;  Location: [facility];  Service: Vascular;  Laterality: Right;  Right Internal Jugular 28cm dialysis catheter insertion procedure time 1701-1720   . INSERTION OF DIALYSIS CATHETER Right 03/11/2016   Procedure: INSERTION OF DIALYSIS CATHETER;  Surgeon: Angelia Mould, MD;  Location: [facility];  Service: Vascular;  Laterality: Right;  . INTRAOPERATIVE TRANSESOPHAGEAL ECHOCARDIOGRAM N/A 03/15/2013   Procedure: INTRAOPERATIVE TRANSESOPHAGEAL ECHOCARDIOGRAM;  Surgeon: Ivin Poot, MD;  Location: [facility];  Service: Open Heart Surgery;  Laterality: N/A;  . LEFT HEART CATHETERIZATION WITH CORONARY ANGIOGRAM N/A 07/29/2012   Procedure: LEFT HEART CATHETERIZATION WITH CORONARY ANGIOGRAM;  Surgeon: Troy Sine, MD;  Location: [facility] CATH LAB;  Service: Cardiovascular;  Laterality: N/A;  . LEFT HEART CATHETERIZATION WITH CORONARY ANGIOGRAM N/A 03/10/2013   Procedure: LEFT HEART CATHETERIZATION WITH CORONARY ANGIOGRAM;  Surgeon: Troy Sine, MD;  Location: [facility] CATH LAB;  Service: Cardiovascular;  Laterality: N/A;  . LEFT HEART CATHETERIZATION WITH CORONARY/GRAFT ANGIOGRAM N/A 12/24/2011   Procedure: LEFT HEART CATHETERIZATION WITH Beatrix Fetters;  Surgeon: [name], MD;  Location: [facility] CATH LAB;  Service: Cardiovascular;  Laterality: N/A;  . LEFT HEART CATHETERIZATION WITH CORONARY/GRAFT ANGIOGRAM N/A 12/16/2013   Procedure: LEFT HEART CATHETERIZATION WITH Beatrix Fetters;  Surgeon: Troy Sine, MD;  Location: [facility] CATH LAB;  Service: Cardiovascular;  Laterality: N/A;  . left kidney removed    .  LIGATION ARTERIOVENOUS GORTEX GRAFT Left 03/11/2016   Procedure: LIGATION THIGH ARTERIOVENOUS GORTEX GRAFT;  Surgeon: Angelia Mould, MD;  Location: [facility];  Service: Vascular;  Laterality: Left;  . LIGATION OF ARTERIOVENOUS  FISTULA Left 08/11/2016   Procedure: LIGATION OF ARTERIOVENOUS  FISTULA;  Surgeon: Elam Dutch, MD;  Location: [facility];  Service: Vascular;  Laterality: Left;  . LIVER TRANSPLANT  10/25/2009   sees Dr Ferol Luz 1 every 6 months, saw last in Dec 2013. [name] Coord [phone]  . PERIPHERAL VASCULAR CATHETERIZATION N/A 11/06/2015   Procedure: Abdominal Aortogram;  Surgeon: Serafina Mitchell, MD;  Location: [facility];  Service:  Cardiovascular;  Laterality: N/A;  . PERIPHERAL VASCULAR CATHETERIZATION N/A 11/06/2015   Procedure: Lower Extremity Angiography;  Surgeon: Serafina Mitchell, MD;  Location: [facility] LAB;  Service: Cardiovascular;  Laterality: N/A;  . PERIPHERAL VASCULAR CATHETERIZATION  11/06/2015   Procedure: Peripheral Vascular Atherectomy;  Surgeon: Serafina Mitchell, MD;  Location: [facility];  Service: Cardiovascular;;  Left Superficial femoral  . SHUNTOGRAM Left 05/15/2014   Procedure: SHUNTOGRAM;  Surgeon: Conrad [place], MD;  Location: [facility] CATH LAB;  Service: Cardiovascular;  Laterality: Left;  . SMALL INTESTINE SURGERY  90's  . SPINAL GROWTH RODS  2010   "put 2 metal rods in my back; they had detetriorated" (08/30/2012)  . THROMBECTOMY    . THROMBECTOMY AND REVISION OF ARTERIOVENTOUS (AV) GORETEX  GRAFT Left 03/30/2014  . THROMBECTOMY AND REVISION OF ARTERIOVENTOUS (AV) GORETEX  GRAFT Left 03/30/2014   Procedure: THROMBECTOMY AND REVISION OF ARTERIOVENTOUS (AV) GORETEX  GRAFT;  Surgeon: Conrad Gordon, MD;  Location: [facility];  Service: Vascular;  Laterality: Left;  . TUBAL LIGATION  1990's    OB History    No data available       Home Medications    Prior to Admission medications   Medication Sig Start Date End Date Taking?  Authorizing Provider  ALPRAZolam Duanne Moron) 0.5 MG tablet Take 0.5 mg by mouth 3 (three) times daily as needed for anxiety.  08/26/16  Yes [provider]  Amino Acids-Protein Hydrolys (FEEDING SUPPLEMENT, PRO-STAT SUGAR FREE 64,) LIQD Take 30 mLs by mouth 2 (two) times daily. Patient taking differently: Take 30 mLs by mouth every Monday, Wednesday, and Friday.  08/28/16  Yes Lavina Hamman, MD  aspirin EC 81 MG tablet Take 81 mg by mouth daily.    Yes [provider]  benzonatate (TESSALON PERLES) 100 MG capsule Take 100 mg by mouth 3 (three) times daily as needed for cough.    Yes [provider]  calcitRIOL (ROCALTROL) 0.5 MCG capsule Take 1 capsule (0.5 mcg total) by mouth every Monday, Wednesday, and Friday with hemodialysis. 07/25/16  Yes Angiulli, Lavon Paganini, PA-C  calcium acetate (PHOSLO) 667 MG capsule Take 1 capsule (667 mg total) by mouth 3 (three) times daily with meals. 07/24/16  Yes Angiulli, Lavon Paganini, PA-C  calcium carbonate (TUMS - DOSED IN MG ELEMENTAL CALCIUM) 500 MG chewable tablet Chew 1 tablet by mouth daily.   Yes [provider]  carvedilol (COREG) 3.125 MG tablet Take 3.125 mg by mouth 2 (two) times daily with a meal.  09/18/16  Yes [provider]  feeding supplement (BOOST / RESOURCE BREEZE) LIQD Take 1 Container by mouth 3 (three) times daily between meals. 04/25/16  Yes Reyne Dumas, MD  gabapentin (NEURONTIN) 300 MG capsule Take 300 mg by mouth at bedtime. Daughter reports patient taking 100 mg on hemodialysis days   Yes [provider]  glucose 4 GM chewable tablet Chew 4 g by mouth once as needed for low blood sugar.  12/27/15 12/26/16 Yes [provider]  lactulose (CHRONULAC) 10 GM/15ML solution Take 45 mLs (30 g total) by mouth 3 (three) times daily. 11/01/16  Yes Robbie Lis, MD  levofloxacin (LEVAQUIN) 750 MG tablet Take 750 mg by mouth daily. Started taking 11/15/16 for 5 days 11/15/16  Yes [provider]  levothyroxine (SYNTHROID, LEVOTHROID) 200 MCG tablet Take 1 tablet (200 mcg total) by mouth daily before breakfast. 10/03/16  Yes Barton Dubois, MD  lidocaine (LIDODERM) 5 %  Place 1 patch onto the skin daily. Remove & Discard patch within 12 hours or as directed by MD 10/09/16  Yes Verlee Monte, MD  methocarbamol (ROBAXIN) 500 MG tablet Take 0.5 tablets (250 mg total) by mouth every 6 (six) hours as needed for muscle spasms. 07/24/16  Yes Angiulli, Lavon Paganini, PA-C  metoCLOPramide (REGLAN) 5 MG tablet Take 1 tablet (5 mg total) by mouth 2 (two) times daily before a meal. Decrease to once a day before breakfast after a couple of weeks. Patient taking differently: Take 5 mg by mouth daily as needed for nausea or vomiting.  07/24/16  Yes Angiulli, Lavon Paganini, PA-C  metoprolol tartrate (LOPRESSOR) 25 MG tablet Take 0.5 tablets (12.5 mg total) by mouth 2 (two) times daily. 10/09/16  Yes Verlee Monte, MD  midodrine (PROAMATINE) 10 MG tablet Take 1 tablet (10 mg total) by mouth 3 (three) times daily with meals. 10/02/16  Yes Barton Dubois, MD  mirtazapine (REMERON) 15 MG tablet Take 15 mg by mouth at bedtime.  08/17/16  Yes [provider]  multivitamin (RENA-VIT) TABS tablet Take 1 tablet by mouth at bedtime. 05/20/16  Yes Love, Ivan Anchors, PA-C  nitroGLYCERIN (NITROSTAT) 0.4 MG SL tablet Place 1 tablet (0.4 mg total) under the tongue every 5 (five) minutes as needed for chest pain. 03/20/14  Yes Elgergawy, Silver Huguenin, MD  ondansetron (ZOFRAN ODT) 8 MG disintegrating tablet 8mg  ODT q4 hours prn nausea Patient taking differently: Take 8 mg by mouth every 4 (four) hours as needed for nausea or vomiting.  09/18/16  Yes Delo, Nathaneil Canary, MD  oxyCODONE (OXY IR/ROXICODONE) 5 MG immediate release tablet Take 1 tablet (5 mg total) by mouth every 4 (four) hours as needed for severe pain. 10/09/16  Yes Verlee Monte, MD  promethazine (PHENERGAN) 12.5 MG tablet Take 12.5 mg by mouth every 6 (six) hours as needed for nausea or  vomiting.   Yes [provider]  tacrolimus (PROGRAF) 1 MG capsule Take 2 mg by mouth 2 (two) times daily.    Yes [provider]  traMADol (ULTRAM) 50 MG tablet Take 50 mg by mouth 3 (three) times daily as needed for moderate pain.   Yes [provider]  traZODone (DESYREL) 50 MG tablet Take 50 mg by mouth at bedtime.   Yes [provider]    Family History Family History  Problem Relation Age of Onset  . Cancer Mother   . Diabetes Mother   . Hypertension Mother   . Stroke Mother   . Cancer Father   . Anesthesia problems Neg Hx   . Hypotension Neg Hx   . Malignant hyperthermia Neg Hx   . Pseudochol deficiency Neg Hx     Social History Social History  Substance Use Topics  . Smoking status: Current Every Day Smoker    Packs/day: 0.25    Years: 43.00    Types: Cigarettes  . Smokeless tobacco: Never Used     Comment: 8/18 - down to <0.5 ppd  . Alcohol use No     Allergies   Acetaminophen; Morphine and related; Codeine; and Mirtazapine   Review of Systems Review of Systems  All other systems reviewed and are negative.    Physical Exam Updated Vital Signs BP 109/65   Temp 98.3 F (36.8 C) (Oral)   Resp (!) 0   Ht 5\' 4"  (1.626 m) Comment: prior to bka  Wt 47.2 kg (104 lb)   SpO2 94%   BMI 17.85 kg/m  Physical Exam  Constitutional: She is oriented to person, place, and time. She appears well-developed and well-nourished. No distress.  HENT:  Head: Normocephalic and atraumatic.  Eyes: EOM are normal.  Neck: Normal range of motion.  Cardiovascular: Normal rate, regular rhythm and normal heart sounds.   Pulmonary/Chest:  Tachypnea.  No accessory muscle use.  Rhonchi on the right  Abdominal: Soft. She exhibits no distension. There is no tenderness.  Musculoskeletal: Normal range of motion.  Neurological: She is alert and oriented to person, place, and time.  Skin: Skin is warm and dry.  Psychiatric: She has a normal mood  and affect. Judgment normal.  Nursing note and vitals reviewed.    ED Treatments / Results  Labs (all labs ordered are listed, but only abnormal results are displayed) Labs Reviewed  CBC - Abnormal; Notable for the following:       Result Value   RDW 17.5 (*)    Platelets 65 (*)    All other components within normal limits  COMPREHENSIVE METABOLIC PANEL - Abnormal; Notable for the following:    Sodium 133 (*)    Potassium 5.5 (*)    Chloride 99 (*)    Glucose, Bld 103 (*)    Creatinine, Ser 4.22 (*)    Calcium 8.8 (*)    Albumin 2.0 (*)    AST 50 (*)    Alkaline Phosphatase 176 (*)    Total Bilirubin 3.7 (*)    GFR calc non Af Amer 10 (*)    GFR calc Af Amer 12 (*)    All other components within normal limits  PROTIME-INR - Abnormal; Notable for the following:    Prothrombin Time 15.9 (*)    All other components within normal limits    EKG  EKG Interpretation None       Radiology Dg Chest 2 View  Result Date: 11/19/2016 CLINICAL DATA:  Cough and shortness of breath EXAM: CHEST  2 VIEW COMPARISON:  Chest radiograph 10/29/2016 FINDINGS: Unchanged cardiomegaly. Right IJ dual-lumen catheter tip is in the proximal right atrium. There are bilateral pleural effusions, right larger than left, with a loculated lateral component on the right. I do not see lung markings extending to the lateral chest wall on the right. IMPRESSION: 1. Right-sided hydropneumothorax versus loculated pleural effusion. Chest CT is recommended for better characterization. 2. Small left pleural effusion with associated atelectasis. Electronically Signed   By: Ulyses Jarred M.D.   On: 11/25/2016 02:06   Ct Chest Wo Contrast  Result Date: 11/23/2016 CLINICAL DATA:  Increasing shortness of breath. Productive cough since Friday. EXAM: CT CHEST WITHOUT CONTRAST TECHNIQUE: Multidetector CT imaging of the chest was performed following the standard protocol without IV contrast. COMPARISON:  None. FINDINGS:  Cardiovascular: Cardiac enlargement. Postoperative changes with aortic valve replacement and coronary bypass. Calcification in the native coronary arteries. No pericardial effusion. Calcification of the aorta. No aortic aneurysm. Central venous catheter with tip in the right atrium. Mediastinum/Nodes: Prominent lymph nodes throughout the mediastinum, with right paratracheal nodes measuring up to about 11 mm diameter. These are nonspecific but likely reactive. The esophagus is decompressed. Lungs/Pleura: Small left and moderate right pleural effusions. Atelectasis in the lung bases. Prominent airspace consolidation in the right perihilar region and involving the right lower lobe. There is fluid and debris filling the right bronchus intermedius and right lower lobe bronchi. This likely represents aspiration pneumonia. No pneumothorax. Upper Abdomen: Limited visualization of the upper abdominal organs due to motion artifact and technique.  There appears to be extensive vascular calcification throughout the abdomen. Possible right renal cysts suggestive of polycystic kidney. Musculoskeletal: Sternotomy wires.  No destructive bone lesions. IMPRESSION: 1. Bilateral pleural effusions and basilar atelectasis. Prominent consolidation in the right perihilar and lower lung regions with fluid and debris in the right bronchus intermedius and right lower lobe bronchi consistent with aspiration pneumonia. No pneumothorax. 2. Prominent cardiac enlargement. Previous aortic valve replacement and coronary bypass. 3. Prominent mediastinal lymph nodes are likely reactive. 4. Extensive vascular calcifications throughout the upper abdomen. Aortic Atherosclerosis (ICD10-I70.0). Electronically Signed   By: Lucienne Capers M.D.   On: 11/21/2016 04:29    Procedures Procedures (including critical care time)  Medications Ordered in ED Medications  sodium chloride 0.9 % bolus 500 mL (not administered)  vancomycin (VANCOCIN) IVPB 1000  mg/200 mL premix (not administered)  ceFEPIme (MAXIPIME) 2 g in dextrose 5 % 50 mL IVPB (not administered)  vancomycin (VANCOCIN) 500 mg in sodium chloride 0.9 % 100 mL IVPB (not administered)  ceFEPIme (MAXIPIME) 1 g in dextrose 5 % 50 mL IVPB (not administered)  albuterol (PROVENTIL) (2.5 MG/3ML) 0.083% nebulizer solution 5 mg (5 mg Nebulization Given 11/21/2016 0227)     Initial Impression / Assessment and Plan / ED Course  I have reviewed the triage vital signs and the nursing notes.  Pertinent labs & imaging results that were available during my care of the patient were reviewed by me and considered in my medical decision making (see chart for details).     Patient be started on Vibramycin and Zosyn for possible aspiration pneumonia.  This would explain her cough and shortness of breath.  Patient will be admitted the hospital for ongoing IV antibiotics.  Family updated.  Lactate pending.  Blood cultures obtained.  Last blood pressure was low but soft.  Fluid bolus given.  Will continue to follow closely while in the emergency department.  Patient will be admitted to stepdown unit.  Final Clinical Impressions(s) / ED Diagnoses   Final diagnoses:  Aspiration pneumonia of right lung, unspecified aspiration pneumonia type, unspecified part of lung [facility])    New Prescriptions New Prescriptions   No medications on file     Jola Schmidt, MD 11/29/2016 [phone]

## 2016-11-16 NOTE — H&P (Signed)
History and Physical    [name]:[id number] DOB: October 04, 1955 DOA: 12/10/2016  Referring MD/NP/PA:   PCP: Clovia Cuff, MD   Patient coming from:  The patient is coming from home.  At baseline, pt is dependent for most of ADL.       Chief Complaint: cough, sob  HPI: [name] is a 61 y.o. 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  Per her daughter, patient has been having cough in the past 2 days. She coughs up yellow and thick sputum. Pt also has mild pleuritic chest pain. Patient does not have fever or chills. Patient also has nausea, vomiting, but no abdominal pain. Daughter states that the patient vomited 3-5 times today, no blood in the vomitus. Patient also had loose stool yesterday, which has resolved. Currently no diarrhea. Patient does not have symptoms of UTI, unilateral weakness, rectal bleeding, hematemesis.  ED Course: pt was found to have WBC 6.5, INR 1.23, potassium 5.5, bicarbonate 22, creatinine 4.22, BUN 12, temperature normal, tachycardia. Hypotension with blood pressure 88/65, which improved to 103/73 after 500 mL normal saline bolus. Patient is admitted to stepdown as inpatient.  CT-chest showed 1. Bilateral pleural effusions and basilar atelectasis. Prominent consolidation in the right perihilar and lower lung regions with fluid and debris in the right bronchus intermedius and right lower lobe bronchi consistent with aspiration pneumonia. No pneumothorax. 2. Prominent cardiac enlargement. Previous aortic valve replacement and coronary bypass. 3. Prominent mediastinal lymph nodes are  likely reactive. 4. Extensive vascular calcifications throughout the upper abdomen.  Review of Systems:   General: no fevers, chills, no body weight gain, has poor appetite, has fatigue HEENT: no blurry vision, hearing changes or sore throat Respiratory: has dyspnea, coughing, no wheezing CV: has chest pain, no palpitations GI: has nausea, vomiting, no abdominal pain, diarrhea, constipation GU: no dysuria, burning on urination, increased urinary frequency, hematuria  Ext: no leg edema. s/p of bilateral BKA  Neuro: no unilateral weakness, numbness, or tingling, no vision change or hearing loss Skin: no rash, no skin tear. MSK: No muscle spasm, no deformity, no limitation of range of movement in spin Heme: No easy bruising.  Travel history: No recent long distant travel.  Allergy:  Allergies  Allergen Reactions  . Acetaminophen Other (See Comments)    Liver transplant recipient   . Morphine And Related Itching  . Codeine Itching  . Mirtazapine Other (See Comments)    hallucination    Past Medical History:  Diagnosis Date  . Anemia    takes Folic Acid daily  . Anxiety   . Arthritis    "left hand, back" (08/30/2012)  . Asthma   . CAD (coronary artery disease) Jan. 2015   Cath: 20% LAD, 50% D1; s/p LIMA-LAD  . Chest pain 03/06/2015  . CHF (congestive heart failure) ([place]) 05/2016  . Chronic back pain   . Chronic constipation    takes MIralax and Colace daily  . COPD (chronic obstructive pulmonary disease) ([place])   . Depression    takes Cymbalta for "severe" depression  . ESRD on dialysis [facility])    "MWF; [facility]" (08/12/2016)  . GERD (gastroesophageal reflux disease)    takes Omeprazole daily  .  Headache    "at least monthly" (08/30/2012)  . Hepatitis C   . History of blood transfusion    "several" (08/30/2012)  . Hypertension    takes Metoprolol and Lisinopril daily, sees Dr Bea Graff  . Hypothyroidism    takes Synthroid daily  . Migraine    "last migraine was in 2013"  (08/30/2012)  . Myocardial infarction ([place])   . Neuromuscular disorder ([place])    carpal tunnel in right hand  . Obesity   . Peripheral vascular disease (HCC) hands and legs  . Pneumonia    "today and several times before" (08/30/2012)  . 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 S/P bilateral BKA (below knee amputation) ([place]) 04/30/2016  . S/P liver transplant ([place])    2011 at [facility] (cirrhosis due to hep C, got hep C from blood transfuion in 1980's per pt))  . SVT (supraventricular tachycardia) ([place]) 06/09/14  . Tobacco abuse   . Type II diabetes mellitus (HCC)    Levemir 2units daily if > 150    Past Surgical History:  Procedure Laterality Date  . ABDOMINAL AORTOGRAM W/LOWER EXTREMITY N/A 07/04/2016   Procedure: Abdominal Aortogram w/Lower Extremity;  Surgeon: Elam Dutch, MD;  Location: [place] CV LAB;  Service: Cardiovascular;  Laterality: N/A;  . AMPUTATION Left 04/24/2016   Procedure: AMPUTATION BELOW KNEE;  Surgeon: Angelia Mould, MD;  Location: [place];  Service: Vascular;  Laterality: Left;  . AMPUTATION Right 07/08/2016   Procedure: AMPUTATION BELOW KNEE;  Surgeon: Angelia Mould, MD;  Location: [place];  Service: Vascular;  Laterality: Right;  . AORTIC VALVE REPLACEMENT N/A 03/15/2013   AVR; Surgeon: Ivin Poot, MD;  Location: [facility] OR; Open Heart Surgery;  51mm[employer] mechanical prosthesis, top hat valve  . ARTERIOVENOUS GRAFT PLACEMENT Left 10/03/10    forearm  . AV FISTULA PLACEMENT  01/29/2011   Procedure: INSERTION OF ARTERIOVENOUS (AV) GORE-TEX GRAFT ARM;  Surgeon: Elam Dutch, MD;  Location: [facility] OR;  Service: Vascular;  Laterality: Right;  . AV FISTULA PLACEMENT  03/10/2011   Procedure: INSERTION OF ARTERIOVENOUS (AV) GORE-TEX GRAFT THIGH;  Surgeon: Elam Dutch, MD;  Location: [place];  Service: Vascular;  Laterality: Left;  . AV FISTULA PLACEMENT Left 07/21/2016   Procedure: CREATION OF LEFT UPPER ARM ARTERIOVENOUS (AV) FISTULA;   Surgeon: Conrad [place], MD;  Location: [place];  Service: Vascular;  Laterality: Left;  . [place] REMOVAL  12/23/2010   Procedure: REMOVAL OF ARTERIOVENOUS GORETEX GRAFT ([place]);  Surgeon: Elam Dutch, MD;  Location: [place];  Service: Vascular;  Laterality: Left;  procedure started @1736 -7846  . CHOLECYSTECTOMY  1993  . CORONARY ANGIOGRAPHY N/A 08/12/2016   Procedure: CORONARY ANGIOGRAPHY (CATH LAB);  Surgeon: Leonie Man, MD;  Location: [place] CV LAB;  Service: Cardiovascular;  Laterality: N/A;  . CORONARY ARTERY BYPASS GRAFT N/A 03/15/2013   Procedure: CORONARY ARTERY BYPASS GRAFTING (CABG) times one using left internal mammary artery.;  Surgeon: Ivin Poot, MD;  Location: [place];  Service: Open Heart Surgery;  Laterality: N/A;  POSS CABG X 1  . CYSTOSCOPY  1990's  . INSERTION OF DIALYSIS CATHETER  12/23/2010   Procedure: INSERTION OF DIALYSIS CATHETER;  Surgeon: Elam Dutch, MD;  Location: [place];  Service: Vascular;  Laterality: Right;  Right Internal Jugular 28cm dialysis catheter insertion procedure time 1701-1720   . INSERTION OF DIALYSIS CATHETER Right 03/11/2016   Procedure: INSERTION OF DIALYSIS CATHETER;  Surgeon:  Angelia Mould, MD;  Location: [place];  Service: Vascular;  Laterality: Right;  . INTRAOPERATIVE TRANSESOPHAGEAL ECHOCARDIOGRAM N/A 03/15/2013   Procedure: INTRAOPERATIVE TRANSESOPHAGEAL ECHOCARDIOGRAM;  Surgeon: Ivin Poot, MD;  Location: [place];  Service: Open Heart Surgery;  Laterality: N/A;  . LEFT HEART CATHETERIZATION WITH CORONARY ANGIOGRAM N/A 07/29/2012   Procedure: LEFT HEART CATHETERIZATION WITH CORONARY ANGIOGRAM;  Surgeon: Troy Sine, MD;  Location: [facility] CATH LAB;  Service: Cardiovascular;  Laterality: N/A;  . LEFT HEART CATHETERIZATION WITH CORONARY ANGIOGRAM N/A 03/10/2013   Procedure: LEFT HEART CATHETERIZATION WITH CORONARY ANGIOGRAM;  Surgeon: Troy Sine, MD;  Location: [facility] C[facility] LAB;  Service: Cardiovascular;  Laterality: N/A;  . LEFT  HEART CATHETERIZATION WITH CORONARY/GRAFT ANGIOGRAM N/A 12/24/2011   Procedure: LEFT HEART CATHETERIZATION WITH Beatrix Fetters;  Surgeon: [name], MD;  Location: [facility] CATH LAB;  Service: Cardiovascular;  Laterality: N/A;  . LEFT HEART CATHETERIZATION WITH CORONARY/GRAFT ANGIOGRAM N/A 12/16/2013   Procedure: LEFT HEART CATHETERIZATION WITH Beatrix Fetters;  Surgeon: Troy Sine, MD;  Location: [facility] CATH LAB;  Service: Cardiovascular;  Laterality: N/A;  . left kidney removed    . LIGATION ARTERIOVENOUS GORTEX GRAFT Left 03/11/2016   Procedure: LIGATION THIGH ARTERIOVENOUS GORTEX GRAFT;  Surgeon: Angelia Mould, MD;  Location: [place];  Service: Vascular;  Laterality: Left;  . LIGATION OF ARTERIOVENOUS  FISTULA Left 08/11/2016   Procedure: LIGATION OF ARTERIOVENOUS  FISTULA;  Surgeon: Elam Dutch, MD;  Location: [place];  Service: Vascular;  Laterality: Left;  . LIVER TRANSPLANT  10/25/2009   sees Dr Ferol Luz 1 every 6 months, saw last in Dec 2013. [name] Coord [phone]  . PERIPHERAL VASCULAR CATHETERIZATION N/A 11/06/2015   Procedure: Abdominal Aortogram;  Surgeon: Serafina Mitchell, MD;  Location: [place] CV LAB;  Service: Cardiovascular;  Laterality: N/A;  . PERIPHERAL VASCULAR CATHETERIZATION N/A 11/06/2015   Procedure: Lower Extremity Angiography;  Surgeon: Serafina Mitchell, MD;  Location: [place] CV LAB;  Service: Cardiovascular;  Laterality: N/A;  . PERIPHERAL VASCULAR CATHETERIZATION  11/06/2015   Procedure: Peripheral Vascular Atherectomy;  Surgeon: Serafina Mitchell, MD;  Location: [place] CV LAB;  Service: Cardiovascular;;  Left Superficial femoral  . SHUNTOGRAM Left 05/15/2014   Procedure: SHUNTOGRAM;  Surgeon: Conrad [place], MD;  Location: [facility] CATH LAB;  Service: Cardiovascular;  Laterality: Left;  . SMALL INTESTINE SURGERY  90's  . SPINAL GROWTH RODS  2010   "put 2 metal rods in my back; they had detetriorated" (08/30/2012)  . THROMBECTOMY      . THROMBECTOMY AND REVISION OF ARTERIOVENTOUS (AV) GORETEX  GRAFT Left 03/30/2014  . THROMBECTOMY AND REVISION OF ARTERIOVENTOUS (AV) GORETEX  GRAFT Left 03/30/2014   Procedure: THROMBECTOMY AND REVISION OF ARTERIOVENTOUS (AV) GORETEX  GRAFT;  Surgeon: Conrad [place], MD;  Location: [place];  Service: Vascular;  Laterality: Left;  . TUBAL LIGATION  1990's    Social History:  reports that she has been smoking Cigarettes.  She has a 10.75 pack-year smoking history. She has never used smokeless tobacco. She reports that she does not drink alcohol or use drugs.  Family History:  Family History  Problem Relation Age of Onset  . Cancer Mother   . Diabetes Mother   . Hypertension Mother   . Stroke Mother   . Cancer Father   . Anesthesia problems Neg Hx   . Hypotension Neg Hx   . Malignant hyperthermia Neg Hx   . Pseudochol deficiency Neg Hx  Prior to Admission medications   Medication Sig Start Date End Date Taking? Authorizing Provider  ALPRAZolam Duanne Moron) 0.5 MG tablet Take 0.5 mg by mouth 3 (three) times daily as needed for anxiety.  08/26/16  Yes [provider]  Amino Acids-Protein Hydrolys (FEEDING SUPPLEMENT, PRO-STAT SUGAR FREE 64,) LIQD Take 30 mLs by mouth 2 (two) times daily. Patient taking differently: Take 30 mLs by mouth every Monday, Wednesday, and Friday.  08/28/16  Yes Lavina Hamman, MD  aspirin EC 81 MG tablet Take 81 mg by mouth daily.    Yes [provider]  benzonatate (TESSALON PERLES) 100 MG capsule Take 100 mg by mouth 3 (three) times daily as needed for cough.    Yes [provider]  calcitRIOL (ROCALTROL) 0.5 MCG capsule Take 1 capsule (0.5 mcg total) by mouth every Monday, Wednesday, and Friday with hemodialysis. 07/25/16  Yes Angiulli, Lavon Paganini, PA-C  calcium acetate (PHOSLO) 667 MG capsule Take 1 capsule (667 mg total) by mouth 3 (three) times daily with meals. 07/24/16  Yes Angiulli, Lavon Paganini, PA-C  calcium carbonate (TUMS - DOSED IN MG  ELEMENTAL CALCIUM) 500 MG chewable tablet Chew 1 tablet by mouth daily.   Yes [provider]  carvedilol (COREG) 3.125 MG tablet Take 3.125 mg by mouth 2 (two) times daily with a meal.  09/18/16  Yes [provider]  feeding supplement (BOOST / RESOURCE BREEZE) LIQD Take 1 Container by mouth 3 (three) times daily between meals. 04/25/16  Yes Reyne Dumas, MD  gabapentin (NEURONTIN) 300 MG capsule Take 300 mg by mouth at bedtime. Daughter reports patient taking 100 mg on hemodialysis days   Yes [provider]  glucose 4 GM chewable tablet Chew 4 g by mouth once as needed for low blood sugar.  12/27/15 12/26/16 Yes [provider]  lactulose (CHRONULAC) 10 GM/15ML solution Take 45 mLs (30 g total) by mouth 3 (three) times daily. 11/01/16  Yes Robbie Lis, MD  levofloxacin (LEVAQUIN) 750 MG tablet Take 750 mg by mouth daily. Started taking 11/15/16 for 5 days 11/15/16  Yes [provider]  levothyroxine (SYNTHROID, LEVOTHROID) 200 MCG tablet Take 1 tablet (200 mcg total) by mouth daily before breakfast. 10/03/16  Yes Barton Dubois, MD  lidocaine (LIDODERM) 5 % Place 1 patch onto the skin daily. Remove & Discard patch within 12 hours or as directed by MD 10/09/16  Yes Verlee Monte, MD  methocarbamol (ROBAXIN) 500 MG tablet Take 0.5 tablets (250 mg total) by mouth every 6 (six) hours as needed for muscle spasms. 07/24/16  Yes Angiulli, Lavon Paganini, PA-C  metoCLOPramide (REGLAN) 5 MG tablet Take 1 tablet (5 mg total) by mouth 2 (two) times daily before a meal. Decrease to once a day before breakfast after a couple of weeks. Patient taking differently: Take 5 mg by mouth daily as needed for nausea or vomiting.  07/24/16  Yes Angiulli, Lavon Paganini, PA-C  metoprolol tartrate (LOPRESSOR) 25 MG tablet Take 0.5 tablets (12.5 mg total) by mouth 2 (two) times daily. 10/09/16  Yes Verlee Monte, MD  midodrine (PROAMATINE) 10 MG tablet Take 1 tablet (10 mg total) by mouth 3 (three)  times daily with meals. 10/02/16  Yes Barton Dubois, MD  mirtazapine (REMERON) 15 MG tablet Take 15 mg by mouth at bedtime.  08/17/16  Yes [provider]  multivitamin (RENA-VIT) TABS tablet Take 1 tablet by mouth at bedtime. 05/20/16  Yes Love, Ivan Anchors, PA-C  nitroGLYCERIN (NITROSTAT) 0.4 MG SL  tablet Place 1 tablet (0.4 mg total) under the tongue every 5 (five) minutes as needed for chest pain. 03/20/14  Yes Elgergawy, Silver Huguenin, MD  ondansetron (ZOFRAN ODT) 8 MG disintegrating tablet 8mg  ODT q4 hours prn nausea Patient taking differently: Take 8 mg by mouth every 4 (four) hours as needed for nausea or vomiting.  09/18/16  Yes Delo, Nathaneil Canary, MD  oxyCODONE (OXY IR/ROXICODONE) 5 MG immediate release tablet Take 1 tablet (5 mg total) by mouth every 4 (four) hours as needed for severe pain. 10/09/16  Yes Verlee Monte, MD  promethazine (PHENERGAN) 12.5 MG tablet Take 12.5 mg by mouth every 6 (six) hours as needed for nausea or vomiting.   Yes [provider]  tacrolimus (PROGRAF) 1 MG capsule Take 2 mg by mouth 2 (two) times daily.    Yes [provider]  traMADol (ULTRAM) 50 MG tablet Take 50 mg by mouth 3 (three) times daily as needed for moderate pain.   Yes [provider]  traZODone (DESYREL) 50 MG tablet Take 50 mg by mouth at bedtime.   Yes [provider]    Physical Exam: Vitals:   11/22/2016 0415 12/08/2016 0445 12/07/2016 0515 11/13/2016 0530  BP: 96/71 (!) 88/65 103/72 99/72  Resp: (!) 5 (!) 8 15 (!) 5  Temp:      TempSrc:      SpO2:      Weight:      Height:       General: Not in acute distress HEENT:       Eyes: PERRL, EOMI, no scleral icterus.       ENT: No discharge from the ears and nose, no pharynx injection, no tonsillar enlargement.        Neck: No JVD, no bruit, no mass felt. Heme: No neck lymph node enlargement. Cardiac: S1/S2, RRR, No murmurs, No gallops or rubs. Respiratory: has decreased air movement bilaterally. No rales, wheezing,  rhonchi or rubs. GI: Soft, nondistended, nontender, no rebound pain, no organomegaly, BS present. GU: No hematuria Ext: No pitting leg edema bilaterally. S/p of bilateral BKA Musculoskeletal: No joint deformities, No joint redness or warmth, no limitation of ROM in spin. Skin: Stage 2 sacral pressure ulcer Neuro: Alert, oriented X3, cranial nerves II-XII grossly intact, moves all extremities normally.  Psych: Patient is not psychotic, no suicidal or hemocidal ideation.  Labs on Admission: I have personally reviewed following labs and imaging studies  CBC:  Recent Labs Lab 11/20/2016 0200  WBC 6.5  HGB 12.9  HCT 38.8  MCV 90.9  PLT 65*   Basic Metabolic Panel:  Recent Labs Lab 11/18/2016 0200  NA 133*  K 5.5*  CL 99*  CO2 22  GLUCOSE 103*  BUN 12  CREATININE 4.22*  CALCIUM 8.8*   GFR: Estimated Creatinine Clearance: 10.3 mL/min (A) (by C-G formula based on SCr of 4.22 mg/dL (H)). Liver Function Tests:  Recent Labs Lab 11/13/2016 0200  AST 50*  ALT 15  ALKPHOS 176*  BILITOT 3.7*  PROT 7.4  ALBUMIN 2.0*   No results for input(s): LIPASE, AMYLASE in the last 168 hours. No results for input(s): AMMONIA in the last 168 hours. Coagulation Profile:  Recent Labs Lab 11/11/16 11/16/16 0200  INR 1.3 1.28   Cardiac Enzymes: No results for input(s): CKTOTAL, CKMB, CKMBINDEX, TROPONINI in the last 168 hours. BNP (last 3 results) No results for input(s): PROBNP in the last 8760 hours. HbA1C: No results for input(s): HGBA1C in the last 72 hours.  CBG: No results for input(s): GLUCAP in the last 168 hours. Lipid Profile: No results for input(s): CHOL, HDL, LDLCALC, TRIG, CHOLHDL, LDLDIRECT in the last 72 hours. Thyroid Function Tests: No results for input(s): TSH, T4TOTAL, FREET4, T3FREE, THYROIDAB in the last 72 hours. Anemia Panel: No results for input(s): VITAMINB12, FOLATE, FERRITIN, TIBC, IRON, RETICCTPCT in the last 72 hours. Urine analysis:    Component Value  Date/Time   COLORURINE YELLOW 11/02/2012 0428   APPEARANCEUR CLOUDY (A) 11/02/2012 0428   LABSPEC 1.011 11/02/2012 0428   PHURINE 8.0 11/02/2012 0428   GLUCOSEU 250 (A) 11/02/2012 0428   HGBUR LARGE (A) 11/02/2012 0428   BILIRUBINUR NEGATIVE 11/02/2012 0428   KETONESUR NEGATIVE 11/02/2012 0428   PROTEINUR >300 (A) 11/02/2012 0428   UROBILINOGEN 0.2 11/02/2012 0428   NITRITE NEGATIVE 11/02/2012 0428   LEUKOCYTESUR NEGATIVE 11/02/2012 0428   Sepsis Labs: @LABRCNTIP (procalcitonin:4,lacticidven:4) )No results found for this or any previous visit (from the past 240 hour(s)).   Radiological Exams on Admission: Dg Chest 2 View  Result Date: 12/02/2016 CLINICAL DATA:  Cough and shortness of breath EXAM: CHEST  2 VIEW COMPARISON:  Chest radiograph 10/29/2016 FINDINGS: Unchanged cardiomegaly. Right IJ dual-lumen catheter tip is in the proximal right atrium. There are bilateral pleural effusions, right larger than left, with a loculated lateral component on the right. I do not see lung markings extending to the lateral chest wall on the right. IMPRESSION: 1. Right-sided hydropneumothorax versus loculated pleural effusion. Chest CT is recommended for better characterization. 2. Small left pleural effusion with associated atelectasis. Electronically Signed   By: Ulyses Jarred M.D.   On: 12/08/2016 02:06   Ct Chest Wo Contrast  Result Date:  CLINICAL DATA:  Increasing shortness of breath. Productive cough since Friday. EXAM: CT CHEST WITHOUT CONTRAST TECHNIQUE: Multidetector CT imaging of the chest was performed following the standard protocol without IV contrast. COMPARISON:  None. FINDINGS: Cardiovascular: Cardiac enlargement. Postoperative changes with aortic valve replacement and coronary bypass. Calcification in the native coronary arteries. No pericardial effusion. Calcification of the aorta. No aortic aneurysm. Central venous catheter with tip in the right atrium. Mediastinum/Nodes:  Prominent lymph nodes throughout the mediastinum, with right paratracheal nodes measuring up to about 11 mm diameter. These are nonspecific but likely reactive. The esophagus is decompressed. Lungs/Pleura: Small left and moderate right pleural effusions. Atelectasis in the lung bases. Prominent airspace consolidation in the right perihilar region and involving the right lower lobe. There is fluid and debris filling the right bronchus intermedius and right lower lobe bronchi. This likely represents aspiration pneumonia. No pneumothorax. Upper Abdomen: Limited visualization of the upper abdominal organs due to motion artifact and technique. There appears to be extensive vascular calcification throughout the abdomen. Possible right renal cysts suggestive of polycystic kidney. Musculoskeletal: Sternotomy wires.  No destructive bone lesions. IMPRESSION: 1. Bilateral pleural effusions and basilar atelectasis. Prominent consolidation in the right perihilar and lower lung regions with fluid and debris in the right bronchus intermedius and right lower lobe bronchi consistent with aspiration pneumonia. No pneumothorax. 2. Prominent cardiac enlargement. Previous aortic valve replacement and coronary bypass. 3. Prominent mediastinal lymph nodes are likely reactive. 4. Extensive vascular calcifications throughout the upper abdomen. Aortic Atherosclerosis (ICD10-I70.0). Electronically Signed   By: Lucienne Capers M.D.   On: 12/03/2016 04:29     EKG: Not done in ED, will get one.   Assessment/Plan Principal Problem:   Aspiration pneumonia (HCC) Active Problems:   Hypothyroidism   Type 2 diabetes mellitus with chronic  kidney disease ([place])   End stage renal disease on dialysis ([place])   CAD (coronary artery disease), native coronary artery   History of prosthetic aortic valve replacement   Chronic combined systolic and diastolic congestive heart failure (HCC)   COPD (chronic obstructive pulmonary disease) (HCC)    Tobacco abuse   Chronic anticoagulation w/ coumadin, goal INR 2.5-3.0 w/ AVR   Long term current use of anticoagulant therapy   Hyperkalemia   S/P liver transplant (HCC)   Sepsis (HCC)   Hypotension   S/P bilateral BKA (below knee amputation) ([place])   H/O mitral valve replacement with mechanical valve   Thrombocytopenia (HCC)   Possible aspiration pneumonia and sepsis: CT of chest showed blateral pleural effusions and possible aspiration pneumonia. Patient does not have fever or leukocytosis. Blood pressure was low initially, which responded to IV fluid, currently blood pressure is at SBP 90-100s. Patient meets criteria for sepsis with hypotension and tachycardia. Lactic acid elevated at 2.34.   - will admit to SDU as inpt - IV Vancomycin and Zosyn were started in ED, will continue - Mucinex for cough  - prn Albuterol Nebs for SOB - Urine legionella and S. pneumococcal antigen - Follow up blood culture x2, sputum culture and respiratory virus panel, plus Flu pcr - will get Procalcitonin and trend lactic acid level per sepsis protocol - IVF: 500 mL of NS bolus in ED, followed by 75 mL per hour of NS-->please d/c IV when bp is stabalized and lactic acid is normalized since she is dialysis pt. 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 Patient is not taking med at home - Not on antihyperglycemic meds at this time - Uses glucose tablets as needed for hypoglycemia   H/O mitral valve replacement with mechanical valve: on coumadin, INR is subtherapeutic,  1.23 on admission - Coumadin per pharmcy - IV heparin bridge  Stage 2 sacral pressure ulcer - Care per RN  CAD (coronary artery disease), native coronary artery: pt has pleuritic chest pain, which is likely due to pneumonia -Continue aspirin, metoprolol -When necessary nitroglycerin glycerin exam -troponin 3  Tobacco abuse: -nicotine patch  Mild Hyperkalemia: K=5.5 -Kayexalate 30 g 1  Thrombocytopenia ([place]): platelet is 65 which was 140 on 11/01/16. No acute altered mental status, less likely to have TTP. Etiology is not clear. May be related to history of cirrhosis and Coumadin use. No active bleeding. -Follow-up by CBC -get peripheral smear  Hypotension: Seems to be a chronic issue. Patient is on midodrine at home. bp responded to IV fluids. Currently blood pressure is at SBP 90-100s. -continue midodrine -IVF as above  Nausea vomiting: Seems to be chronic issue. Patient does not have abdominal pain - check lipase - prn Zofran   DVT ppx: on Coumadin Code Status: DNR (I discussed with patient in the presence of her daughter who is the POA, and explained the meaning of CODE STATUS. Patient wants to be DNR) Family Communication:   Yes, patient's  daguhter at bed side Disposition Plan:  Anticipate discharge back to previous home environment  Consults called:  none Admission status:  SDU/inpation       Date of Service 11/11/2016    Ivor Costa [facility] Pager [phone]  If 7PM-7AM, please contact night-coverage [email or web address] Password [email or web address] 11/21/2016, 6:16 AM

## 2016-11-16 NOTE — Progress Notes (Signed)
Pharmacy Antibiotic Note  [name] is a 61 y.o. female admitted on 12/04/2016 with pneumonia.  Pharmacy has been consulted for vancomycin and cefepime dosing.  Plan: Vancomycin 1000mg  x1 then 500mg  IV every HD.  Goal pre-HD level 15-20 mcg/mL. Cefepime 2g x1 then 1g IV every 24 hours.  Height: 5\' 4"  (162.6 cm) (prior to bka) Weight: 104 lb (47.2 kg) IBW/kg (Calculated) : 54.7  Temp (24hrs), Avg:98.3 F (36.8 C), Min:98.3 F (36.8 C), Max:98.3 F (36.8 C)   Recent Labs Lab 11/22/2016 0200  WBC 6.5  CREATININE 4.22*    Estimated Creatinine Clearance: 10.3 mL/min (A) (by C-G formula based on SCr of 4.22 mg/dL (H)).    Allergies  Allergen Reactions  . Acetaminophen Other (See Comments)    Liver transplant recipient   . Morphine And Related Itching  . Codeine Itching  . Mirtazapine Other (See Comments)    hallucination     Thank you for allowing pharmacy to be a part of this patient's care.  Wynona Neat, PharmD, BCPS  11/22/2016 3:48 AM

## 2016-11-17 ENCOUNTER — Other Ambulatory Visit: Payer: Self-pay

## 2016-11-17 DIAGNOSIS — Z952 Presence of prosthetic heart valve: Secondary | ICD-10-CM

## 2016-11-17 DIAGNOSIS — I959 Hypotension, unspecified: Secondary | ICD-10-CM

## 2016-11-17 DIAGNOSIS — Z89512 Acquired absence of left leg below knee: Secondary | ICD-10-CM

## 2016-11-17 DIAGNOSIS — Z515 Encounter for palliative care: Secondary | ICD-10-CM

## 2016-11-17 DIAGNOSIS — J690 Pneumonitis due to inhalation of food and vomit: Secondary | ICD-10-CM

## 2016-11-17 DIAGNOSIS — A419 Sepsis, unspecified organism: Principal | ICD-10-CM

## 2016-11-17 DIAGNOSIS — N186 End stage renal disease: Secondary | ICD-10-CM

## 2016-11-17 DIAGNOSIS — E875 Hyperkalemia: Secondary | ICD-10-CM

## 2016-11-17 DIAGNOSIS — I5042 Chronic combined systolic (congestive) and diastolic (congestive) heart failure: Secondary | ICD-10-CM

## 2016-11-17 DIAGNOSIS — R0609 Other forms of dyspnea: Secondary | ICD-10-CM

## 2016-11-17 DIAGNOSIS — Z992 Dependence on renal dialysis: Secondary | ICD-10-CM

## 2016-11-17 DIAGNOSIS — Z66 Do not resuscitate: Secondary | ICD-10-CM

## 2016-11-17 DIAGNOSIS — Z89511 Acquired absence of right leg below knee: Secondary | ICD-10-CM

## 2016-11-17 LAB — CBC
HCT: 35.8 % — ABNORMAL LOW (ref 36.0–46.0)
Hemoglobin: 12 g/dL (ref 12.0–15.0)
MCH: 29.9 pg (ref 26.0–34.0)
MCHC: 33.5 g/dL (ref 30.0–36.0)
MCV: 89.1 fL (ref 78.0–100.0)
Platelets: 74 K/uL — ABNORMAL LOW (ref 150–400)
RBC: 4.02 MIL/uL (ref 3.87–5.11)
RDW: 17.1 % — ABNORMAL HIGH (ref 11.5–15.5)
WBC: 6.3 K/uL (ref 4.0–10.5)

## 2016-11-17 LAB — RENAL FUNCTION PANEL
ALBUMIN: 1.8 g/dL — AB (ref 3.5–5.0)
Anion gap: 17 — ABNORMAL HIGH (ref 5–15)
BUN: 22 mg/dL — AB (ref 6–20)
CALCIUM: 8.8 mg/dL — AB (ref 8.9–10.3)
CO2: 16 mmol/L — ABNORMAL LOW (ref 22–32)
Chloride: 100 mmol/L — ABNORMAL LOW (ref 101–111)
Creatinine, Ser: 5.51 mg/dL — ABNORMAL HIGH (ref 0.44–1.00)
GFR calc Af Amer: 9 mL/min — ABNORMAL LOW (ref >60)
GFR calc non Af Amer: 8 mL/min — ABNORMAL LOW (ref >60)
GLUCOSE: 161 mg/dL — AB (ref 65–99)
PHOSPHORUS: 7.2 mg/dL — AB (ref 2.5–4.6)
Potassium: 6.5 mmol/L (ref 3.5–5.1)
SODIUM: 133 mmol/L — AB (ref 135–145)

## 2016-11-17 LAB — PROTIME-INR
INR: 1.32
Prothrombin Time: 16.2 seconds — ABNORMAL HIGH (ref 11.4–15.2)

## 2016-11-17 LAB — TACROLIMUS LEVEL: Tacrolimus (FK506) - LabCorp: 4.9 ng/mL (ref 2.0–20.0)

## 2016-11-17 LAB — HEPARIN LEVEL (UNFRACTIONATED): Heparin Unfractionated: 0.31 [IU]/mL (ref 0.30–0.70)

## 2016-11-17 LAB — TROPONIN I: Troponin I: 0.55 ng/mL (ref <0.03)

## 2016-11-17 MED ORDER — SODIUM CHLORIDE 0.9 % IV SOLN: 100.0000 mL | INTRAVENOUS | Status: DC | PRN

## 2016-11-17 MED ORDER — HEPARIN SODIUM (PORCINE) 1000 UNIT/ML DIALYSIS
1000.0000 [IU] | INTRAMUSCULAR | Status: DC | PRN
  Filled 2016-11-17: qty 1

## 2016-11-17 MED ORDER — LIDOCAINE-PRILOCAINE 2.5-2.5 % EX CREA
1.0000 "application " | TOPICAL_CREAM | CUTANEOUS | Status: DC | PRN
  Filled 2016-11-17: qty 5

## 2016-11-17 MED ORDER — METHYLPREDNISOLONE SODIUM SUCC 125 MG IJ SOLR: 60.0000 mg | Freq: Four times a day (QID) | INTRAMUSCULAR | Status: DC

## 2016-11-17 MED ORDER — ALTEPLASE 2 MG IJ SOLR: 2.0000 mg | Freq: Once | INTRAMUSCULAR | Status: DC | PRN

## 2016-11-17 MED ORDER — PENTAFLUOROPROP-TETRAFLUOROETH EX AERO: 1.0000 "application " | INHALATION_SPRAY | CUTANEOUS | Status: DC | PRN

## 2016-11-17 MED ORDER — LIDOCAINE HCL (PF) 1 % IJ SOLN: 5.0000 mL | INTRAMUSCULAR | Status: DC | PRN

## 2016-11-17 MED ORDER — METHYLPREDNISOLONE SODIUM SUCC 125 MG IJ SOLR
INTRAMUSCULAR | Status: AC
  Administered 2016-11-17: 65 mg
  Filled 2016-11-17: qty 2

## 2016-11-17 MED ORDER — FENTANYL CITRATE (PF) 100 MCG/2ML IJ SOLN: 25.0000 ug | INTRAMUSCULAR | Status: DC | PRN

## 2016-11-17 MED ORDER — WARFARIN SODIUM 3 MG PO TABS: 3.0000 mg | ORAL_TABLET | Freq: Once | ORAL | Status: DC

## 2016-11-18 ENCOUNTER — Ambulatory Visit: Payer: Self-pay

## 2016-11-21 LAB — CULTURE, BLOOD (ROUTINE X 2)
CULTURE: NO GROWTH
Culture: NO GROWTH
Special Requests: ADEQUATE
Special Requests: ADEQUATE

## 2016-12-11 NOTE — Consult Note (Signed)
   [facility] Inpatient Consult   11/25/2016  [name] April 07, 1955 [id number]   Made aware of hospitalization by [place].  [name] has been active with [place] Management prior to admission.  Reviewed palliative medicine notes. It appears [name] is now comfort care.  Will update [facility] RNCM.    [name], MSN-Ed, RN,BSN [facility] Liaison [phone]

## 2016-12-11 NOTE — Progress Notes (Signed)
Checked in at desk about patient.  Was told she is dying.  Greeted the family.  The family told me they have a pastor who is on her way to be with them.  I asked if they were interested in Advance Directives and they stated they already have done this.   Waiting on their pastor to come be with them.   November 24, 2016 1000  Clinical Encounter Type  Visited With Patient and family together  Visit Type Initial  Referral From Physician  Advance Directives (For Healthcare)  Does Patient Have a Medical Advance Directive? Yes   Conard Novak, Chaplain

## 2016-12-11 NOTE — Progress Notes (Signed)
Came by to see patient and daughter. Events since admission noted and I spoke with primary team.  She has transitioned to palliative measures including discontinuation of hemodialysis.  They are aware of her hyperkalemia.  Family is accepting of this.  I provided support and solicited for any questions.    At this time will s/o.  No further HD.

## 2016-12-11 NOTE — Patient Outreach (Signed)
Care Coordination:  3:45 pm  Spoke with daughter to offer support as patient was transitioned to comfort care.  4:40pm  Call from daughter stating that her mother was gone. Provided a listening ear and support for daughter to talk and let out her feelings. Encouraged daughter to call me if needed.   Will plan to close case.  [name], RN, BSN, CEN [facility] [employer] [phone]

## 2016-12-11 NOTE — Plan of Care (Signed)
Problem: Education: Goal: Knowledge of [place] [facility] Education information/materials will improve Outcome: Progressing Discussed with patient and family about plan of care for the evening and goal of making her comfortable at this time with some teach back displayed

## 2016-12-11 NOTE — Progress Notes (Signed)
Progress Note  Patient Name: [name] Date of Encounter: 12-01-16  Primary Cardiologist: Dr. [name]  Subjective   Unable to obtain.  Patient just received lorazepam and is sedated.   Inpatient Medications    Scheduled Meds: . aspirin EC  81 mg Oral Daily  . carvedilol  3.125 mg Oral BID WC  . feeding supplement  1 Container Oral TID BM  . feeding supplement (PRO-STAT SUGAR FREE 64)  30 mL Oral BID  . gabapentin  300 mg Oral QHS  . lactulose  30 g Oral TID  . levothyroxine  175 mcg Oral QAC breakfast  . lidocaine  1 patch Transdermal Q24H  . methylPREDNISolone (SOLU-MEDROL) injection  60 mg Intravenous Q6H  . metoCLOPramide  5 mg Oral BID AC  . midodrine  10 mg Oral TID WC  . mirtazapine  15 mg Oral QHS  . multivitamin  1 tablet Oral QHS  . nicotine  21 mg Transdermal Daily  . tacrolimus  2 mg Oral BID  . traZODone  50 mg Oral QHS  . warfarin  3 mg Oral ONCE-1800  . Warfarin - Pharmacist Dosing Inpatient   Does not apply q1800   Continuous Infusions: . sodium chloride    . sodium chloride    . heparin 900 Units/hr (11/14/2016 1928)  . piperacillin-tazobactam (ZOSYN)  IV 3.375 g (December 01, 2016 0503)  . vancomycin     PRN Meds: sodium chloride, sodium chloride, albuterol, ALPRAZolam, alteplase, dextromethorphan-guaiFENesin, fentaNYL (SUBLIMAZE) injection, heparin, hydrALAZINE, lidocaine (PF), lidocaine-prilocaine, LORazepam, methocarbamol, nitroGLYCERIN, ondansetron (ZOFRAN) IV, oxyCODONE, pentafluoroprop-tetrafluoroeth, traMADol   Vital Signs    Vitals:   Dec 01, 2016 0445 December 01, 2016 0500 12-01-16 0600 12/01/16 0826  BP:  (!) 85/65 117/80 127/73  Pulse:   91 91  Resp: (!) 41 17 (!) 2 (!) 33  Temp:  (!) 97.5 F (36.4 C)  (!) 96.3 F (35.7 C)  TempSrc:  Oral  Axillary  SpO2: 92% 93% 96% 95%  Weight:      Height:        Intake/Output Summary (Last 24 hours) at 2016-12-01 0954 Last data filed at 2016/12/01 0300  Gross per 24 hour  Intake           307.66 ml  Output                 0 ml  Net           307.66 ml   Filed Weights   12/02/2016 0044 11/25/2016 1442  Weight: 47.2 kg (104 lb) 36.7 kg (81 lb)    Telemetry    Sinus rhythm.  PVCs.  - Personally Reviewed  ECG      Sinus tachycardia.  Rate 104 bpm.  IVCD.  - Personally Reviewed  Physical Exam   GENERAL:  Chronically ill-appearing woman in mild respiratory distress.   HEENT: Pupils equal round and reactive, fundi not visualized, oral mucosa unremarkable NECK:  No jugular venous distention, waveform within normal limits, carotid upstroke brisk and symmetric, no bruits, no thyromegaly LYMPHATICS:  No cervical adenopathy LUNGS:  Clear to auscultation bilaterally HEART:  RRR.  PMI not displaced or sustained, Mechanical S1.  S2 within normal limits, no S3, no S4, no clicks, no rubs, apical systolic murmur ABD:  Flat, positive bowel sounds normal in frequency in pitch, no bruits, no rebound, no guarding, no midline pulsatile mass, no hepatomegaly, no splenomegaly EXT:  2 plus pulses throughout, no edema, no cyanosis no clubbing SKIN:  No rashes no nodules  NEURO:  Cranial nerves II through XII grossly intact, motor grossly intact throughout [facility]:  Cognitively intact, oriented to person place and time  Labs    Chemistry Recent Labs Lab 12/08/2016 0200 12/06/2016 1808 15-Dec-2016 0557  NA 133* 133* 133*  K 5.5* 5.7* 6.5*  CL 99* 97* 100*  CO2 22 21* 16*  GLUCOSE 103* 97 161*  BUN 12 17 22*  CREATININE 4.22* 4.91* 5.51*  CALCIUM 8.8* 9.1 8.8*  PROT 7.4  --   --   ALBUMIN 2.0*  --  1.8*  AST 50*  --   --   ALT 15  --   --   ALKPHOS 176*  --   --   BILITOT 3.7*  --   --   GFRNONAA 10* 9* 8*  GFRAA 12* 10* 9*  ANIONGAP 12 15 17*     Hematology Recent Labs Lab 11/10/2016 0200 December 15, 2016 0557  WBC 6.5 6.3  RBC 4.27 4.02  HGB 12.9 12.0  HCT 38.8 35.8*  MCV 90.9 89.1  MCH 30.2 29.9  MCHC 33.2 33.5  RDW 17.5* 17.1*  PLT 65* 74*    Cardiac Enzymes Recent Labs Lab 11/25/2016 0625  11/19/2016 2216 Dec 15, 2016 0557  TROPONINI 0.51* 0.42* 0.55*   No results for input(s): TROPIPOC in the last 168 hours.   BNPNo results for input(s): BNP, PROBNP in the last 168 hours.   DDimer No results for input(s): DDIMER in the last 168 hours.   Radiology    Dg Chest 2 View  Result Date: 12/10/2016 CLINICAL DATA:  Cough and shortness of breath EXAM: CHEST  2 VIEW COMPARISON:  Chest radiograph 10/29/2016 FINDINGS: Unchanged cardiomegaly. Right IJ dual-lumen catheter tip is in the proximal right atrium. There are bilateral pleural effusions, right larger than left, with a loculated lateral component on the right. I do not see lung markings extending to the lateral chest wall on the right. IMPRESSION: 1. Right-sided hydropneumothorax versus loculated pleural effusion. Chest CT is recommended for better characterization. 2. Small left pleural effusion with associated atelectasis. Electronically Signed   By: Ulyses Jarred M.D.   On: 11/14/2016 02:06   Ct Chest Wo Contrast  Result Date: 11/16/2016 CLINICAL DATA:  Increasing shortness of breath. Productive cough since Friday. EXAM: CT CHEST WITHOUT CONTRAST TECHNIQUE: Multidetector CT imaging of the chest was performed following the standard protocol without IV contrast. COMPARISON:  None. FINDINGS: Cardiovascular: Cardiac enlargement. Postoperative changes with aortic valve replacement and coronary bypass. Calcification in the native coronary arteries. No pericardial effusion. Calcification of the aorta. No aortic aneurysm. Central venous catheter with tip in the right atrium. Mediastinum/Nodes: Prominent lymph nodes throughout the mediastinum, with right paratracheal nodes measuring up to about 11 mm diameter. These are nonspecific but likely reactive. The esophagus is decompressed. Lungs/Pleura: Small left and moderate right pleural effusions. Atelectasis in the lung bases. Prominent airspace consolidation in the right perihilar region and involving  the right lower lobe. There is fluid and debris filling the right bronchus intermedius and right lower lobe bronchi. This likely represents aspiration pneumonia. No pneumothorax. Upper Abdomen: Limited visualization of the upper abdominal organs due to motion artifact and technique. There appears to be extensive vascular calcification throughout the abdomen. Possible right renal cysts suggestive of polycystic kidney. Musculoskeletal: Sternotomy wires.  No destructive bone lesions. IMPRESSION: 1. Bilateral pleural effusions and basilar atelectasis. Prominent consolidation in the right perihilar and lower lung regions with fluid and debris in the right bronchus intermedius and right lower lobe bronchi  consistent with aspiration pneumonia. No pneumothorax. 2. Prominent cardiac enlargement. Previous aortic valve replacement and coronary bypass. 3. Prominent mediastinal lymph nodes are likely reactive. 4. Extensive vascular calcifications throughout the upper abdomen. Aortic Atherosclerosis (ICD10-I70.0). Electronically Signed   By: Lucienne Capers M.D.   On: 11/27/2016 04:29    Cardiac Studies   Echo 09/30/16: Study Conclusions  - Left ventricle: The cavity size was normal. Wall thickness was   normal. Anterolateral and anterior hypokinesis. Inferolateral   akinesis. There was an area of mid septal akinesis and mid   inferior akinesis. Systolic function was moderately to severely   reduced. The estimated ejection fraction was in the range of 30%   to 35%. Features are consistent with a pseudonormal left   ventricular filling pattern, with concomitant abnormal relaxation   and increased filling pressure (grade 2 diastolic dysfunction). - Aortic valve: There was a mechanical prosthetic aortic valve. No   significant prosthetic valve stenosis. There was mild   regurgitation. Mean gradient (S): 10 mm Hg. - Mitral valve: Moderately calcified annulus. Moderately calcified   leaflets . The findings are  consistent with mild stenosis. There   was mild regurgitation. Mean gradient (D): 4 mm Hg. Valve area by   pressure half-time: 2.59 cm^2. - Left atrium: The atrium was moderately dilated. - Right ventricle: The cavity size was normal. Systolic function   was mildly reduced. - Pulmonary arteries: No complete TR doppler jet so unable to   estimate PA systolic pressure. - Inferior vena cava: The vessel was normal in size. The   respirophasic diameter changes were in the normal range (= 50%),   consistent with normal central venous pressure.  Impressions:  - Normal LV size with EF 30-35%. Wall motion abnormalities   suggestive of multivessel CAD. Moderate diastolic dysfunction.   normal RV size with mildly decreased systolic function.   Mechanical aortic valve with mild perivalvular regurgitation.   Mild mitral stenosis and mitral regurgitation. [name]   LHC 08/12/16:   Ost LAD lesion, 50 %stenosed. Mid LAD lesion, 70 %stenosed.  Dist LAD lesion, 99 %stenosed at Graft Insertion Site.  1st Diag lesion, 90 %stenosed.  LIMA-dLAD graft was visualized by angiography and is normal in caliber and anatomically normal with faint competetive flow  Ost Cx lesion, 55 %stenosed.  2nd Mrg lesion, 100 %stenosed. Lat 2nd Mrg lesion, 90 %stenosed.  Ost 3rd Mrg lesion, 100 %stenosed.  Ost RCA lesion, 30 %stenosed.  Dist RCA lesion, 30 %stenosed.  Ost RPDA lesion, 90 %stenosed.  2nd RPLB lesion, 80 %stenosed.   The patient has new CTO of OM2 & OM3 as well as ostial-proximal 80-90% lesions in both the rPDA & rPL2. The CTO vessels are likely not acute as the Troponin levels are not significantly elevated. The one potential PCI target would be ostial rPDA, but with the distal RCA calcified lesion, this would be complicated & would require jailing of the major Right Post-AV Groove Branch with 3 RPL branches.  In the absence of ongoing, active chest pain, reviewing images with Dr. Irish Lack, we  both agreed that the best option is to optimize medical management and volume control as she is essentially immobilized in a wheelchair. If symptoms warrant, with ongoing active chest pain, we could consider difficult PCI of the RPDA. This would be a relatively small caliber stent  Plan: Return to nursing unit for ongoing care. Restart IV heparin and warfarin. Optimize medical management and volume control.  Patient Profile  61 y.o. female with CAD s/p CABG, mechanical AVR, ESRD on HD, Hepatitis C s/p liver transplant, COPD, and bilateral BKA admitted with hypoxic respiratory failure and aspiration pneumonia.   Assessment & Plan    # Hypoxic respiratory failure: Aspiration pneumonia noted on chest CT.  Respiratory viral panel was positive for Corona virus.  She is transitioning to palliative care.    # CAD s/p CABG:  # Stable angina: # Elevated troponin:  Ms. [place] had a LHC 08/2016 that showed her LIMA to the LAD was patent but there was 99% obstruction proximal to the anastamosis and new CTO of OM2/OM3.  The only possible target was Medical management was recommended.  Troponin currently elevated to 0.55.  She is currently sedated and pursuing palliative care.    # SVT: No recurrent episodes since the ED.  Continue carvedilol. Potassium 6.5.    # Mechanical AVR:  INR 1.3.  She is being bridged with heparin.  Goal 2.5-3.5.    We will sign off.  Please call with questions.    For questions or updates, please contact [place] Please consult [email or web address] for contact info under Cardiology/STEMI.      Signed, Skeet Latch, MD  12-08-2016, 9:54 AM

## 2016-12-11 NOTE — Progress Notes (Signed)
Nutrition Brief Note  Chart reviewed. Pt now transitioning to comfort care.  No further nutrition interventions warranted at this time.  Please re-consult as needed.   Talyia Allende A. Taccara Bushnell, RD, LDN, CDE Pager: [phone] After hours Pager: [phone]  

## 2016-12-11 NOTE — Care Management Note (Signed)
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  Patient rapidly declined and family opted for comfort care only. All medical therapy was discontinued.                   Action/Plan: NCM will follow for dc needs.  Expected Discharge Date:  11/19/16               Expected Discharge Plan:     In-House Referral:     Discharge planning Services  CM Consult  Post Acute Care Choice:    Choice offered to:     DME Arranged:    DME Agency:     HH Arranged:    HH Agency:     Status of Service:  In process, will continue to follow  If discussed at Long Length of Stay Meetings, dates discussed:    Additional Comments:  Zenon Mayo, RN 11/25/2016, 2:24 PM

## 2016-12-11 NOTE — Progress Notes (Signed)
[facility] for Heparin and Warfarin Indication: Mechanical AVR  Allergies  Allergen Reactions  . Acetaminophen Other (See Comments)    Liver transplant recipient   . Morphine And Related Itching  . Codeine Itching  . Mirtazapine Other (See Comments)    hallucination    Patient Measurements: Height: 5\' 4"  (162.6 cm) (prior to BKA) Weight: 81 lb (36.7 kg) IBW/kg (Calculated) : 54.7  Vital Signs: Temp: 96.3 F (35.7 C) (10/08 0826) Temp Source: Axillary (10/08 0826) BP: 127/73 (10/08 0826) Pulse Rate: 91 (10/08 0826)  Labs:  Recent Labs  12/01/2016 0200 11/20/2016 0625 12/01/2016 1808 12/07/2016 2216 2016-12-12 0557  HGB 12.9  --   --   --  12.0  HCT 38.8  --   --   --  35.8*  PLT 65*  --   --   --  74*  LABPROT 15.9*  --   --   --  16.2*  INR 1.28  --   --   --  1.32  HEPARINUNFRC  --   --  0.15*  --  0.31  CREATININE 4.22*  --  4.91*  --  5.51*  TROPONINI  --  0.51*  --  0.42* 0.55*    Estimated Creatinine Clearance: 6.1 mL/min (A) (by C-G formula based on SCr of 5.51 mg/dL (H)).   Assessment: 58 YOF on warfarin PTA for history of mechanical AVR. Pharmacy consulted to resume warfarin dosing this admission with a heparin bridge.  Heparin level this morning is therapeutic after a rate increase (HL 0.31 << 0.15, goal of 0.3-0.7). The patient's warfarin dose was not given yesterday evening due to the patient's respiratory issues and alertness. INR today remains SUBtherapeutic (INR 1.32 << 1.28, goal of 2-3).   Noted likely plans for likely palliative measures. Will follow-up on discontinuation of anticoagulation per MD if desired by family.   Goal of Therapy:  Heparin level 0.3-0.7 units/ml INR 2.5-3.5 Monitor platelets by anticoagulation protocol: Yes   Plan:  1. Continue Heparin at 900 units/hr (9 ml/hr) 2. Warfarin 3 mg x 1 dose at 1800 today 3. Will continue to monitor for any signs/symptoms of bleeding and will follow up with  heparin level in 8 hours to confirm therapeutic and PT/INR in the AM (if continued)  Thank you for allowing pharmacy to be a part of this patient's care.  Alycia Rossetti, PharmD, BCPS Clinical Pharmacist Pager: [phone] Clinical phone for 2016/12/12 from 7a-3:30p: [phone] If after 3:30p, please call main pharmacy at: [phone] Dec 12, 2016 9:15 AM

## 2016-12-11 NOTE — Death Summary Note (Signed)
DEATH SUMMARY   Patient Details  Name: [name] MRN: [id number] DOB: Feb 10, 1956  Admission/Discharge Information   Admit Date:  Dec 06, 2016  Date of Death: Date of Death: 12/07/2016  Time of Death: Time of Death: 05-28-08  Length of Stay: 1  Referring Physician: Clovia Cuff, MD   Reason(s) for Hospitalization   RLL pneumonia   Diagnoses  Preliminary cause of death:  Secondary Diagnoses (including complications and co-morbidities):  Principal Problem:   Aspiration pneumonia ([place]) Active Problems:   Hypothyroidism   Type 2 diabetes mellitus with chronic kidney disease ([place])   End stage renal disease on dialysis ([place])   CAD (coronary artery disease), native coronary artery   History of prosthetic aortic valve replacement   Chronic combined systolic and diastolic congestive heart failure (HCC)   COPD (chronic obstructive pulmonary disease) ([place])   Tobacco abuse   Chronic anticoagulation w/ coumadin, goal INR 2.5-3.0 w/ AVR   Long term current use of anticoagulant therapy   Hyperkalemia   S/P liver transplant ([place])   Sepsis ([place])   Hypotension   S/P bilateral BKA (below knee amputation) ([place])   H/O mitral valve replacement with mechanical valve   Thrombocytopenia ([place])   DNR (do not resuscitate)   Brief Hospital Course (including significant findings, care, treatment, and services provided and events leading to death)  [name] is a 61 y.o.femalewith medical history significant of ESRD on HD (MWF), 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 Patient was found to have RLL pneumonia. Admitted for further treatment. Despite antibiotic treatment patient did not  responded well to treatment. Patient went into respiratory distress. Given her multiple comorbidities patient was not able to recover. Family decided to make patient comfort care. Patient was placed on comfort measures. Patient was declared death at 16:10 AM.   Pertinent Labs and Studies  Significant Diagnostic Studies Dg Chest 2 View  Result Date: 06-Dec-2016 CLINICAL DATA:  Cough and shortness of breath EXAM: CHEST  2 VIEW COMPARISON:  Chest radiograph 10/29/2016 FINDINGS: Unchanged cardiomegaly. Right IJ dual-lumen catheter tip is in the proximal right atrium. There are bilateral pleural effusions, right larger than left, with a loculated lateral component on the right. I do not see lung markings extending to the lateral chest wall on the right. IMPRESSION: 1. Right-sided hydropneumothorax versus loculated pleural effusion. Chest CT is recommended for better characterization. 2. Small left pleural effusion with associated atelectasis. Electronically Signed   By: Ulyses Jarred M.D.   On: 12/06/2016 02:06   Dg Chest 2 View  Result Date: 10/29/2016 CLINICAL DATA:  Chest pain and shortness of breath EXAM: CHEST  2 VIEW COMPARISON:  10/06/2016 FINDINGS: Post sternotomy changes and postsurgical changes in the upper abdomen. Right-sided central venous catheter tip overlies the proximal to mid right atrium. There are small bilateral effusions. There is mild cardiomegaly with central vascular congestion and mild interstitial edema. IMPRESSION: 1. Cardiomegaly with central vascular congestion and increased interstitial edema. Slight increased small bilateral effusions. Electronically Signed   By: Donavan Foil M.D.   On: 10/29/2016 20:23   Ct Chest Wo Contrast  Result Date: 2016/12/06 CLINICAL DATA:  Increasing shortness of breath. Productive cough since 2022/05/29. EXAM: CT CHEST WITHOUT CONTRAST TECHNIQUE: Multidetector CT imaging of the chest was performed  following the standard protocol without IV contrast.  COMPARISON:  None. FINDINGS: Cardiovascular: Cardiac enlargement. Postoperative changes with aortic valve replacement and coronary bypass. Calcification in the native coronary arteries. No pericardial effusion. Calcification of the aorta. No aortic aneurysm. Central venous catheter with tip in the right atrium. Mediastinum/Nodes: Prominent lymph nodes throughout the mediastinum, with right paratracheal nodes measuring up to about 11 mm diameter. These are nonspecific but likely reactive. The esophagus is decompressed. Lungs/Pleura: Small left and moderate right pleural effusions. Atelectasis in the lung bases. Prominent airspace consolidation in the right perihilar region and involving the right lower lobe. There is fluid and debris filling the right bronchus intermedius and right lower lobe bronchi. This likely represents aspiration pneumonia. No pneumothorax. Upper Abdomen: Limited visualization of the upper abdominal organs due to motion artifact and technique. There appears to be extensive vascular calcification throughout the abdomen. Possible right renal cysts suggestive of polycystic kidney. Musculoskeletal: Sternotomy wires.  No destructive bone lesions. IMPRESSION: 1. Bilateral pleural effusions and basilar atelectasis. Prominent consolidation in the right perihilar and lower lung regions with fluid and debris in the right bronchus intermedius and right lower lobe bronchi consistent with aspiration pneumonia. No pneumothorax. 2. Prominent cardiac enlargement. Previous aortic valve replacement and coronary bypass. 3. Prominent mediastinal lymph nodes are likely reactive. 4. Extensive vascular calcifications throughout the upper abdomen. Aortic Atherosclerosis (ICD10-I70.0). Electronically Signed   By: Lucienne Capers M.D.   On: 12/06/2016 04:29    Microbiology Recent Results (from the past 240 hour(s))  Blood culture (routine x 2)     Status: None (Preliminary result)   Collection Time: 12/10/2016  5:10  AM  Result Value Ref Range Status   Specimen Description BLOOD RIGHT HAND  Final   Special Requests IN PEDIATRIC BOTTLE Blood Culture adequate volume  Final   Culture NO GROWTH 1 DAY  Final   Report Status PENDING  Incomplete  Blood culture (routine x 2)     Status: None (Preliminary result)   Collection Time: 11/28/2016  5:23 AM  Result Value Ref Range Status   Specimen Description BLOOD RIGHT ARM  Final   Special Requests   Final    BOTTLES DRAWN AEROBIC AND ANAEROBIC Blood Culture adequate volume   Culture NO GROWTH 1 DAY  Final   Report Status PENDING  Incomplete  Respiratory Panel by PCR     Status: Abnormal   Collection Time: 11/19/2016  8:20 AM  Result Value Ref Range Status   Adenovirus NOT DETECTED NOT DETECTED Final   Coronavirus 229E NOT DETECTED NOT DETECTED Final   Coronavirus HKU1 NOT DETECTED NOT DETECTED Final   Coronavirus NL63 DETECTED (A) NOT DETECTED Final   Coronavirus OC43 NOT DETECTED NOT DETECTED Final   Metapneumovirus NOT DETECTED NOT DETECTED Final   Rhinovirus / Enterovirus NOT DETECTED NOT DETECTED Final   Influenza A NOT DETECTED NOT DETECTED Final   Influenza B NOT DETECTED NOT DETECTED Final   Parainfluenza Virus 1 NOT DETECTED NOT DETECTED Final   Parainfluenza Virus 2 NOT DETECTED NOT DETECTED Final   Parainfluenza Virus 3 NOT DETECTED NOT DETECTED Final   Parainfluenza Virus 4 NOT DETECTED NOT DETECTED Final   Respiratory Syncytial Virus NOT DETECTED NOT DETECTED Final   Bordetella pertussis NOT DETECTED NOT DETECTED Final   Chlamydophila pneumoniae NOT DETECTED NOT DETECTED Final   Mycoplasma pneumoniae NOT DETECTED NOT DETECTED Final    Lab Basic Metabolic Panel:  Recent Labs Lab  0200 11/18/2016 1808 11/11/2016 1954 11/28/16 0557  NA 133* 133*  --  133*  K 5.5* 5.7*  --  6.5*  CL 99* 97*  --  100*  CO2 22 21*  --  16*  GLUCOSE 103* 97  --  161*  BUN 12 17  --  22*  CREATININE 4.22* 4.91*  --  5.51*  CALCIUM 8.8* 9.1  --  8.8*   MG  --   --  2.0  --   PHOS  --   --   --  7.2*   Liver Function Tests:  Recent Labs Lab 11/22/2016 0200 12/13/2016 0557  AST 50*  --   ALT 15  --   ALKPHOS 176*  --   BILITOT 3.7*  --   PROT 7.4  --   ALBUMIN 2.0* 1.8*    Recent Labs Lab 11/12/2016 0625  LIPASE 18   No results for input(s): AMMONIA in the last 168 hours. CBC:  Recent Labs Lab 11/23/2016 0200 December 13, 2016 0557  WBC 6.5 6.3  HGB 12.9 12.0  HCT 38.8 35.8*  MCV 90.9 89.1  PLT 65* 74*   Cardiac Enzymes:  Recent Labs Lab 11/27/2016 0625 11/27/2016 2216 Dec 13, 2016 0557  TROPONINI 0.51* 0.42* 0.55*   Sepsis Labs:  Recent Labs Lab 12/01/2016 0200  11/14/2016 0625 11/12/2016 0921 12/06/2016 1953  2216 12/13/2016 0557  PROCALCITON  --   --  0.60  --   --   --   --   WBC 6.5  --   --   --   --   --  6.3  LATICACIDVEN  --   < > 2.2* 2.6* 4.3* 3.9*  --   < > = values in this interval not displayed.  Procedures/Operations   Chipper Oman 12/13/2016, 6:40 PM

## 2016-12-11 NOTE — Progress Notes (Signed)
PROGRESS NOTE [facility]   [name]   HXT:[id number] DOB: [birth year]  DOA: 12/03/2016 PCP: Clovia Cuff, MD   Brief Narrative:  [name]  is a 61 y.o. 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 Patient was found to have RLL pneumonia. Admitted for further treatment. Patient rapidly declined and family opted for comfort care only. All medical therapy was discontinued.    Subjective: Patient seen and examined with daughter at bedside. She is lethargic, but responsive to verbal stimuli, state that she feels ok. Overnight was in respiratory distress had to be on BiPAP, then was removed   Assessment & Plan: Sepsis 2/2 to aspiration PNA (RLL) - Respiratory failure with hypoxia  CT scan shows prominent consolidation in the right pre-iliac and lower lobe regions consistent with aspiration pneumonia. Agree with antibiotics, continue Cefepime and vancomycin for now. Pro-calcitonin not reliable on end stage renal disease patient so will not monitor. Respiratory panel positive for coronal virus.  No active issue at this point, will focus on comfort measures only ([facility])  SVT - they have ordered a bolus of digoxin - patient converted back to normal sinus rhythm in the ED. Blood pressure does not allow significant medication titration Patient was on Coreg and metoprolol. Cardiology has been consulted and recommendations appreciated. We will continue to monitor  Mechanical AVR - stable - INR 1.32 - no further interventions   End-stage renal disease on HD  Hyperkalemia - no role for hemodialysis she may not tolerate.   Comfort care only   History of liver transplant - LFT stable CMO all medication were discontinued   Status 2 sacral pressure ulcer Wound care   CAD/Chest pain with elevated troponin - long standing hx - TNI chronically elevated - demand ischemia  Cardiology on board - recommending medical management -No active issue as patient is CMO  Hypotension - chronic patient on midodrine  Monitor   Hypothyroidism  TSH low 0.043 Free T4 slight elevated  Synthroid was adjusted    Goal of care  Case discussed with daughter, poor prognosis given comorbidities. Daughter report that patient wanted to be comfortable and not to be in pain. She saw her mother struggling last night and she does not want that for her mother. Other daughter over the phone and daughter at bedside decide to make patient comfortable and withdraw all medical treatment. Palliative care was consulted for possible hospice facility   DVT prophylaxis: Heparin + Warfarin  Code Status: DNR/DNI  Family Communication: None at bedside  Disposition Plan: Hospital demise vs residential hospice   Consultants:   Cardiology   Nephrology   Procedures:   None   Antimicrobials: Anti-infectives    Start     Dose/Rate Route Frequency Ordered Stop   2016/12/02 1200  vancomycin (VANCOCIN) 500 mg in sodium chloride 0.9 % 100 mL IVPB  Status:  Discontinued     500 mg 100 mL/hr over 60 Minutes Intravenous Every M-W-F (Hemodialysis) 11/13/2016 0351 12/02/16 1024   02-Dec-2016 0600  ceFEPIme (MAXIPIME) 1 g in dextrose 5 % 50 mL IVPB  Status:  Discontinued     1 g 100 mL/hr over 30 Minutes Intravenous Every 24 hours 12/06/2016 0351  11/28/2016 0457   11/15/2016 0600  piperacillin-tazobactam (ZOSYN) IVPB 3.375 g  Status:  Discontinued     3.375 g 12.5 mL/hr over 240 Minutes Intravenous Every 12 hours 12/08/2016 0502 Nov 25, 2016 1024   11/19/2016 0445  clindamycin (CLEOCIN) IVPB 300 mg  Status:  Discontinued     300 mg 100 mL/hr over 30 Minutes Intravenous   Once 11/22/2016 0436 11/13/2016 0457   11/15/2016 0400  vancomycin (VANCOCIN) IVPB 1000 mg/200 mL premix     1,000 mg 200 mL/hr over 60 Minutes Intravenous  Once 11/22/2016 0348 11/22/2016 0731   11/28/2016 0400  ceFEPIme (MAXIPIME) 2 g in dextrose 5 % 50 mL IVPB  Status:  Discontinued     2 g 100 mL/hr over 30 Minutes Intravenous  Once 11/28/2016 0348 11/30/2016 0457       Objective: Vitals:   Nov 25, 2016 0500 Nov 25, 2016 0600 11-25-16 0826 Nov 25, 2016 1028  BP: (!) 85/65 117/80 127/73 (!) 108/92  Pulse:  91 91 82  Resp: 17 (!) 2 (!) 33   Temp: (!) 97.5 F (36.4 C)  (!) 96.3 F (35.7 C)   TempSrc: Oral  Axillary   SpO2: 93% 96% 95%   Weight:      Height:        Intake/Output Summary (Last 24 hours) at 11/25/2016 1224 Last data filed at 11-25-16 0300  Gross per 24 hour  Intake           307.66 ml  Output                0 ml  Net           307.66 ml   Filed Weights   11/25/2016 0044 12/08/2016 1442  Weight: 47.2 kg (104 lb) 36.7 kg (81 lb)    Examination:  General exam: Lethargic, but responsive  HEENT: RIJ in place, OP moist  Respiratory system: Coarse BS throughout. Diffuse wheezing and decrease air entry b/l    Cardiovascular system: Tachycardia, F7P1 2+ systolic murmur    Gastrointestinal system: Soft ND NT  Central nervous system: Oriented x 2, no focal  Extremities: B/l BKA's no edema  Skin: Warm and dry   Data Reviewed: I have personally reviewed following labs and imaging studies  CBC:  Recent Labs Lab 12/09/2016 0200 November 25, 2016 0557  WBC 6.5 6.3  HGB 12.9 12.0  HCT 38.8 35.8*  MCV 90.9 89.1  PLT 65* 74*   Basic Metabolic Panel:  Recent Labs Lab 11/14/2016 0200 11/18/2016 1808 11/18/2016 1954 11/25/16 0557  NA 133* 133*  --  133*  K 5.5* 5.7*  --  6.5*  CL 99* 97*  --  100*  CO2 22 21*  --  16*  GLUCOSE 103* 97  --  161*  BUN 12 17  --  22*  CREATININE 4.22* 4.91*  --  5.51*  CALCIUM 8.8* 9.1  --  8.8*  MG  --   --  2.0  --   PHOS  --   --   --  7.2*   GFR: Estimated  Creatinine Clearance: 6.1 mL/min (A) (by C-G formula based on SCr of 5.51 mg/dL (H)). Liver Function Tests:  Recent Labs Lab 11/16/16 0200 2016-11-25 0557  AST 50*  --   ALT 15  --   ALKPHOS 176*  --   BILITOT 3.7*  --   PROT 7.4  --   ALBUMIN 2.0* 1.8*    Recent Labs Lab 11/16/16 0625  LIPASE 18   No results for  input(s): AMMONIA in the last 168 hours. Coagulation Profile:  Recent Labs Lab 11/11/16 11/10/2016 0200 Dec 16, 2016 0557  INR 1.3 1.28 1.32   Cardiac Enzymes:  Recent Labs Lab 11/25/2016 0625 11/14/2016 2216 Dec 16, 2016 0557  TROPONINI 0.51* 0.42* 0.55*   BNP (last 3 results) No results for input(s): PROBNP in the last 8760 hours. HbA1C: No results for input(s): HGBA1C in the last 72 hours. CBG:  Recent Labs Lab 11/18/2016 1735  GLUCAP 72   Lipid Profile: No results for input(s): CHOL, HDL, LDLCALC, TRIG, CHOLHDL, LDLDIRECT in the last 72 hours. Thyroid Function Tests:  Recent Labs  11/18/2016 0626 12/08/2016 1808  TSH 0.043*  --   FREET4  --  1.36*   Anemia Panel: No results for input(s): VITAMINB12, FOLATE, FERRITIN, TIBC, IRON, RETICCTPCT in the last 72 hours. Sepsis Labs:  Recent Labs Lab 12/08/2016 0625 11/22/2016 0921 12/05/2016 1953 11/14/2016 2216  PROCALCITON 0.60  --   --   --   LATICACIDVEN 2.2* 2.6* 4.3* 3.9*    Recent Results (from the past 240 hour(s))  Respiratory Panel by PCR     Status: Abnormal   Collection Time: 11/29/2016  8:20 AM  Result Value Ref Range Status   Adenovirus NOT DETECTED NOT DETECTED Final   Coronavirus 229E NOT DETECTED NOT DETECTED Final   Coronavirus HKU1 NOT DETECTED NOT DETECTED Final   Coronavirus NL63 DETECTED (A) NOT DETECTED Final   Coronavirus OC43 NOT DETECTED NOT DETECTED Final   Metapneumovirus NOT DETECTED NOT DETECTED Final   Rhinovirus / Enterovirus NOT DETECTED NOT DETECTED Final   Influenza A NOT DETECTED NOT DETECTED Final   Influenza B NOT DETECTED NOT DETECTED Final   Parainfluenza Virus 1 NOT  DETECTED NOT DETECTED Final   Parainfluenza Virus 2 NOT DETECTED NOT DETECTED Final   Parainfluenza Virus 3 NOT DETECTED NOT DETECTED Final   Parainfluenza Virus 4 NOT DETECTED NOT DETECTED Final   Respiratory Syncytial Virus NOT DETECTED NOT DETECTED Final   Bordetella pertussis NOT DETECTED NOT DETECTED Final   Chlamydophila pneumoniae NOT DETECTED NOT DETECTED Final   Mycoplasma pneumoniae NOT DETECTED NOT DETECTED Final      Radiology Studies: Dg Chest 2 View  Result Date:  CLINICAL DATA:  Cough and shortness of breath EXAM: CHEST  2 VIEW COMPARISON:  Chest radiograph 10/29/2016 FINDINGS: Unchanged cardiomegaly. Right IJ dual-lumen catheter tip is in the proximal right atrium. There are bilateral pleural effusions, right larger than left, with a loculated lateral component on the right. I do not see lung markings extending to the lateral chest wall on the right. IMPRESSION: 1. Right-sided hydropneumothorax versus loculated pleural effusion. Chest CT is recommended for better characterization. 2. Small left pleural effusion with associated atelectasis. Electronically Signed   By: Ulyses Jarred M.D.   On: 11/27/2016 02:06   Ct Chest Wo Contrast  Result Date: 12/09/2016 CLINICAL DATA:  Increasing shortness of breath. Productive cough since Friday. EXAM: CT CHEST WITHOUT CONTRAST TECHNIQUE: Multidetector CT imaging of the chest was performed following the standard protocol without IV contrast. COMPARISON:  None. FINDINGS: Cardiovascular: Cardiac enlargement. Postoperative changes with aortic valve replacement and coronary bypass. Calcification in the native coronary arteries. No pericardial effusion. Calcification of the aorta. No aortic aneurysm. Central venous catheter with tip in the right atrium. Mediastinum/Nodes: Prominent lymph nodes throughout the mediastinum, with right paratracheal nodes measuring up to about 11 mm diameter. These are nonspecific but likely reactive. The  esophagus is decompressed. Lungs/Pleura: Small left and moderate right pleural  effusions. Atelectasis in the lung bases. Prominent airspace consolidation in the right perihilar region and involving the right lower lobe. There is fluid and debris filling the right bronchus intermedius and right lower lobe bronchi. This likely represents aspiration pneumonia. No pneumothorax. Upper Abdomen: Limited visualization of the upper abdominal organs due to motion artifact and technique. There appears to be extensive vascular calcification throughout the abdomen. Possible right renal cysts suggestive of polycystic kidney. Musculoskeletal: Sternotomy wires.  No destructive bone lesions. IMPRESSION: 1. Bilateral pleural effusions and basilar atelectasis. Prominent consolidation in the right perihilar and lower lung regions with fluid and debris in the right bronchus intermedius and right lower lobe bronchi consistent with aspiration pneumonia. No pneumothorax. 2. Prominent cardiac enlargement. Previous aortic valve replacement and coronary bypass. 3. Prominent mediastinal lymph nodes are likely reactive. 4. Extensive vascular calcifications throughout the upper abdomen. Aortic Atherosclerosis (ICD10-I70.0). Electronically Signed   By: Lucienne Capers M.D.   On: 11/15/2016 04:29    Scheduled Meds: . lidocaine  1 patch Transdermal Q24H  . nicotine  21 mg Transdermal Daily   Continuous Infusions:    LOS: 1 day    Time spent: Total of 35 minutes spent with pt, greater than 50% of which was spent in discussion of  treatment, counseling and coordination of care   Chipper Oman, MD Pager: Text Page via [email or web address]   If 7PM-7AM, please contact night-coverage [email or web address] 12/10/16, 12:24 PM

## 2016-12-11 NOTE — Consult Note (Signed)
Consultation Note Date: November 21, 2016   Patient Name: [name]  DOB: Oct 01, 1955  MRN: [id number]  Age / Sex: 61 y.o., female  PCP: Clovia Cuff, MD Referring Physician: Patrecia Pour, Christean Grief, MD  Reason for Consultation: Establishing goals of care and Psychosocial/spiritual support  HPI/Patient Profile: 61 y.o. 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  Per her daughter, patient has been having cough in the past 2 days. She coughs up yellow and thick sputum. Pt also has mild pleuritic chest pain. Patient does not have fever or chills. Patient also has nausea, vomiting, but no abdominal pain. Daughter states that the patient vomited 3-5 times today, no blood in the vomitus. Patient also had loose stool yesterday, which has resolved. Currently no diarrhea. Patient does not have symptoms of UTI, unilateral weakness, rectal bleeding, hematemesis.  Continued physical, functional and cognitive decline.  Family face EOL decsions   Clinical Assessment and Goals of Care:  This NP Wadie Lessen reviewed medical records, received report from team, assessed the patient and then meet at the patient's bedside along with her daughter/ [name] to discuss diagnosis, prognosis, GOC, EOL wishes disposition and options.  Concept of Hospice and Palliative Care were discussed   A discussion was had today regarding advanced directives.   The difference between a aggressive medical intervention path  and a palliative comfort  care path for this patient at this time was had.  Values and goals of care important to patient and family were attempted to be elicited.  Family has made decision to shift to a full comfort path with no further life-prolonging interventions. The hope is for comfort and dignity at end of life.  Natural trajectory and expectations at EOL were discussed.  Questions and concerns addressed.   Family encouraged to call with questions or concerns.  PMT will continue to support holistically.   NEXT OF KIN    SUMMARY OF RECOMMENDATIONS    Code Status/Advance Care Planning:  DNR   Symptom Management:   Dyspnea: Fentanyl 25 g IV every 1 hour when necessary  Agitation: Ativan 2 mg IV every 4 hours when necessary  Palliative Prophylaxis:   Frequent Pain Assessment and Oral Care  Additional Recommendations (Limitations, Scope, Preferences):  Full Comfort Care  Psycho-social/Spiritual:   Desire for further Chaplaincy support:yes  Additional Recommendations: Grief/Bereavement Support  Prognosis:   Hours - Days  Discharge Planning: Anticipated Hospital Death      Primary Diagnoses: Present on Admission: . CAD (coronary artery disease), native coronary artery . Chronic combined systolic and diastolic congestive heart failure ([place]) . COPD (chronic obstructive pulmonary disease) ([place]) . Hypotension . Hypothyroidism . Tobacco abuse . Type 2 diabetes mellitus with chronic kidney  disease ([place]) . Aspiration pneumonia ([place]) . Hyperkalemia . Thrombocytopenia ([place]) . Sepsis ([place])   I have reviewed the medical record, interviewed the patient and family, and examined the patient. The following aspects are pertinent.  Past Medical History:  Diagnosis Date  . Anemia    takes Folic Acid daily  . Anxiety   . Arthritis    "left hand, back" (08/30/2012)  . Asthma   . CAD (coronary artery disease) Jan. 2015   Cath: 20% LAD, 50% D1; s/p LIMA-LAD  . CHF (congestive heart failure)  ([place]) 05/2016  . Chronic back pain   . Chronic constipation    takes MIralax and Colace daily  . COPD (chronic obstructive pulmonary disease) ([place])   . Depression    takes Cymbalta for "severe" depression  . ESRD on dialysis [facility])    "MWF; [facility]" (08/12/2016)  . GERD (gastroesophageal reflux disease)    takes Omeprazole daily  . Hepatitis C   . History of blood transfusion    "several" (08/30/2012)  . Hypertension    takes Metoprolol and Lisinopril daily, sees Dr Bea Graff  . Hypothyroidism    takes Synthroid daily  . Migraine    "last migraine was in 2013" (08/30/2012)  . Myocardial infarction ([place])   . Neuromuscular disorder ([place])    carpal tunnel in right hand  . Peripheral vascular disease ([facility]) hands and legs  . Pneumonia    "today and several times before" (08/30/2012)  . 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 S/P bilateral BKA (below knee amputation) ([place]) 04/30/2016  . S/P liver transplant ([place])    2011 at [facility] (cirrhosis due to hep C, got hep C from blood transfuion in 1980's per pt))  . SVT (supraventricular tachycardia) ([place]) 06/09/14  . Tobacco abuse   . Type II diabetes mellitus (H[facility])    Levemir 2units daily if > 150   Social History   Social History  . Marital status: Divorced    Spouse name: N/A  . Number of children: N/A  . Years of education: N/A   Occupational History  . disabled    Social History Main Topics  . Smoking status: Current Every Day Smoker    Packs/day: 0.25    Years: 43.00    Types: Cigarettes  . Smokeless tobacco: Never Used     Comment: 8/18 - down to <0.5 ppd  . Alcohol use No  . Drug use: No  . Sexual activity: No   Other Topics Concern  . None   Social History Narrative  . None   Family History  Problem Relation Age of Onset  . Cancer Mother   . Diabetes Mother   . Hypertension Mother   . Stroke Mother   . Cancer Father   . Anesthesia problems Neg Hx   . Hypotension Neg Hx   . Malignant hyperthermia  Neg Hx   . Pseudochol deficiency Neg Hx    Scheduled Meds: . aspirin EC  81 mg Oral Daily  . carvedilol  3.125 mg Oral BID WC  . feeding supplement  1 Container Oral TID BM  . feeding supplement (PRO-STAT SUGAR FREE 64)  30 mL Oral BID  . gabapentin  300 mg Oral QHS  . lactulose  30 g Oral TID  . levothyroxine  175 mcg Oral QAC breakfast  . lidocaine  1 patch Transdermal Q24H  . methylPREDNISolone (SOLU-MEDROL) injection  60 mg Intravenous Q6H  . metoCLOPramide  5 mg Oral BID  AC  . midodrine  10 mg Oral TID WC  . mirtazapine  15 mg Oral QHS  . multivitamin  1 tablet Oral QHS  . nicotine  21 mg Transdermal Daily  . tacrolimus  2 mg Oral BID  . traZODone  50 mg Oral QHS  . warfarin  3 mg Oral ONCE-1800  . Warfarin - Pharmacist Dosing Inpatient   Does not apply q1800   Continuous Infusions: . sodium chloride    . sodium chloride    . heparin 900 Units/hr (11/22/2016 1928)  . piperacillin-tazobactam (ZOSYN)  IV 3.375 g (12-10-16 0503)  . vancomycin     PRN Meds:.sodium chloride, sodium chloride, albuterol, ALPRAZolam, alteplase, dextromethorphan-guaiFENesin, fentaNYL (SUBLIMAZE) injection, heparin, hydrALAZINE, lidocaine (PF), lidocaine-prilocaine, LORazepam, methocarbamol, nitroGLYCERIN, ondansetron (ZOFRAN) IV, oxyCODONE, pentafluoroprop-tetrafluoroeth, traMADol Medications Prior to Admission:  Prior to Admission medications   Medication Sig Start Date End Date Taking? Authorizing Provider  ALPRAZolam Duanne Moron) 0.5 MG tablet Take 0.5 mg by mouth 3 (three) times daily as needed for anxiety.  08/26/16  Yes [provider]  Amino Acids-Protein Hydrolys (FEEDING SUPPLEMENT, PRO-STAT SUGAR FREE 64,) LIQD Take 30 mLs by mouth 2 (two) times daily. Patient taking differently: Take 30 mLs by mouth every Monday, Wednesday, and Friday.  08/28/16  Yes Lavina Hamman, MD  aspirin EC 81 MG tablet Take 81 mg by mouth daily.    Yes [provider]  benzonatate (TESSALON PERLES) 100  MG capsule Take 100 mg by mouth 3 (three) times daily as needed for cough.    Yes [provider]  calcitRIOL (ROCALTROL) 0.5 MCG capsule Take 1 capsule (0.5 mcg total) by mouth every Monday, Wednesday, and Friday with hemodialysis. 07/25/16  Yes Angiulli, Lavon Paganini, PA-C  calcium acetate (PHOSLO) 667 MG capsule Take 1 capsule (667 mg total) by mouth 3 (three) times daily with meals. 07/24/16  Yes Angiulli, Lavon Paganini, PA-C  calcium carbonate (TUMS - DOSED IN MG ELEMENTAL CALCIUM) 500 MG chewable tablet Chew 1 tablet by mouth daily.   Yes [provider]  carvedilol (COREG) 3.125 MG tablet Take 3.125 mg by mouth 2 (two) times daily with a meal.  09/18/16  Yes [provider]  feeding supplement (BOOST / RESOURCE BREEZE) LIQD Take 1 Container by mouth 3 (three) times daily between meals. 04/25/16  Yes Reyne Dumas, MD  gabapentin (NEURONTIN) 300 MG capsule Take 300 mg by mouth at bedtime. Daughter reports patient taking 100 mg on hemodialysis days   Yes [provider]  glucose 4 GM chewable tablet Chew 4 g by mouth once as needed for low blood sugar.  12/27/15 12/26/16 Yes [provider]  lactulose (CHRONULAC) 10 GM/15ML solution Take 45 mLs (30 g total) by mouth 3 (three) times daily. 11/01/16  Yes Robbie Lis, MD  levofloxacin (LEVAQUIN) 750 MG tablet Take 750 mg by mouth daily. Started taking 11/15/16 for 5 days 11/15/16  Yes [provider]  levothyroxine (SYNTHROID, LEVOTHROID) 200 MCG tablet Take 1 tablet (200 mcg total) by mouth daily before breakfast. 10/03/16  Yes Barton Dubois, MD  lidocaine (LIDODERM) 5 % Place 1 patch onto the skin daily. Remove & Discard patch within 12 hours or as directed by MD 10/09/16  Yes Verlee Monte, MD  methocarbamol (ROBAXIN) 500 MG tablet Take 0.5 tablets (250 mg total) by mouth every 6 (six) hours as needed for muscle spasms. 07/24/16  Yes Angiulli, Lavon Paganini, PA-C  metoCLOPramide (REGLAN) 5 MG tablet Take 1 tablet (5  mg  total) by mouth 2 (two) times daily before a meal. Decrease to once a day before breakfast after a couple of weeks. Patient taking differently: Take 5 mg by mouth daily as needed for nausea or vomiting.  07/24/16  Yes Angiulli, Lavon Paganini, PA-C  metoprolol tartrate (LOPRESSOR) 25 MG tablet Take 0.5 tablets (12.5 mg total) by mouth 2 (two) times daily. 10/09/16  Yes Verlee Monte, MD  midodrine (PROAMATINE) 10 MG tablet Take 1 tablet (10 mg total) by mouth 3 (three) times daily with meals. 10/02/16  Yes Barton Dubois, MD  mirtazapine (REMERON) 15 MG tablet Take 15 mg by mouth at bedtime.  08/17/16  Yes [provider]  multivitamin (RENA-VIT) TABS tablet Take 1 tablet by mouth at bedtime. 05/20/16  Yes Love, Ivan Anchors, PA-C  nitroGLYCERIN (NITROSTAT) 0.4 MG SL tablet Place 1 tablet (0.4 mg total) under the tongue every 5 (five) minutes as needed for chest pain. 03/20/14  Yes Elgergawy, Silver Huguenin, MD  ondansetron (ZOFRAN ODT) 8 MG disintegrating tablet 8mg  ODT q4 hours prn nausea Patient taking differently: Take 8 mg by mouth every 4 (four) hours as needed for nausea or vomiting.  09/18/16  Yes Delo, Nathaneil Canary, MD  oxyCODONE (OXY IR/ROXICODONE) 5 MG immediate release tablet Take 1 tablet (5 mg total) by mouth every 4 (four) hours as needed for severe pain. 10/09/16  Yes Verlee Monte, MD  promethazine (PHENERGAN) 12.5 MG tablet Take 12.5 mg by mouth every 6 (six) hours as needed for nausea or vomiting.   Yes [provider]  tacrolimus (PROGRAF) 1 MG capsule Take 2 mg by mouth 2 (two) times daily.    Yes [provider]  traMADol (ULTRAM) 50 MG tablet Take 50 mg by mouth 3 (three) times daily as needed for moderate pain.   Yes [provider]  traZODone (DESYREL) 50 MG tablet Take 50 mg by mouth at bedtime.   Yes [provider]  warfarin (COUMADIN) 2 MG tablet Take 1-2 mg by mouth See admin instructions. Take 1/2 tablet on Monday, Wednesday and Friday then take 1 tablet  all the other days   Yes [provider]   Allergies  Allergen Reactions  . Acetaminophen Other (See Comments)    Liver transplant recipient   . Morphine And Related Itching  . Codeine Itching  . Mirtazapine Other (See Comments)    hallucination   Review of Systems  Unable to perform ROS: Acuity of condition    Physical Exam  Constitutional: She appears cachectic. She appears ill. Nasal cannula in place.  Cardiovascular: Tachycardia present.   Pulmonary/Chest: Tachypnea noted. She has decreased breath sounds in the right lower field and the left lower field.  Neurological: She is unresponsive.  Skin: Skin is warm and dry.    Vital Signs: BP 127/73 (BP Location: Right Arm)   Pulse 91   Temp (!) 96.3 F (35.7 C) (Axillary)   Resp (!) 33   Ht 5\' 4"  (1.626 m) Comment: prior to BKA  Wt 36.7 kg (81 lb)   SpO2 95%   BMI 13.90 kg/m  Pain Assessment: PAINAD   Pain Score: Asleep   SpO2: SpO2: 95 % O2 Device:SpO2: 95 % O2 Flow Rate: .O2 Flow Rate (L/min): 4 L/min  IO: Intake/output summary:  Intake/Output Summary (Last 24 hours) at 2016/12/15 1004 Last data filed at 12/15/16 0300  Gross per 24 hour  Intake           307.66 ml  Output  0 ml  Net           307.66 ml    LBM: Last BM Date: 11/12/16 (per pt) Baseline Weight: Weight: 47.2 kg (104 lb) Most recent weight: Weight: 36.7 kg (81 lb)     Palliative Assessment/Data: 10 %   Discussed with Dr Quincy Simmonds  Time In: 0940 Time Out: 1040 Time Total: 60 min Greater than 50%  of this time was spent counseling and coordinating care related to the above assessment and plan.  Signed by: Wadie Lessen, NP   Please contact Palliative Medicine Team phone at [phone] for questions and concerns.  For individual provider: See Shea Evans

## 2016-12-11 DEATH — deceased

## 2017-07-02 ENCOUNTER — Ambulatory Visit: Payer: Self-pay | Admitting: Pharmacist Clinician (PhC)/ Clinical Pharmacy Specialist

## 2017-07-02 DIAGNOSIS — Z7901 Long term (current) use of anticoagulants: Secondary | ICD-10-CM

## 2018-07-03 IMAGING — CR DG CHEST 1V PORT
1 series · 1 of 1 positions shown · non-contrast
Comparison: 03/09/2016

CLINICAL DATA: Check dialysis catheter positioning

EXAM:
PORTABLE CHEST 1 VIEW

[AP]
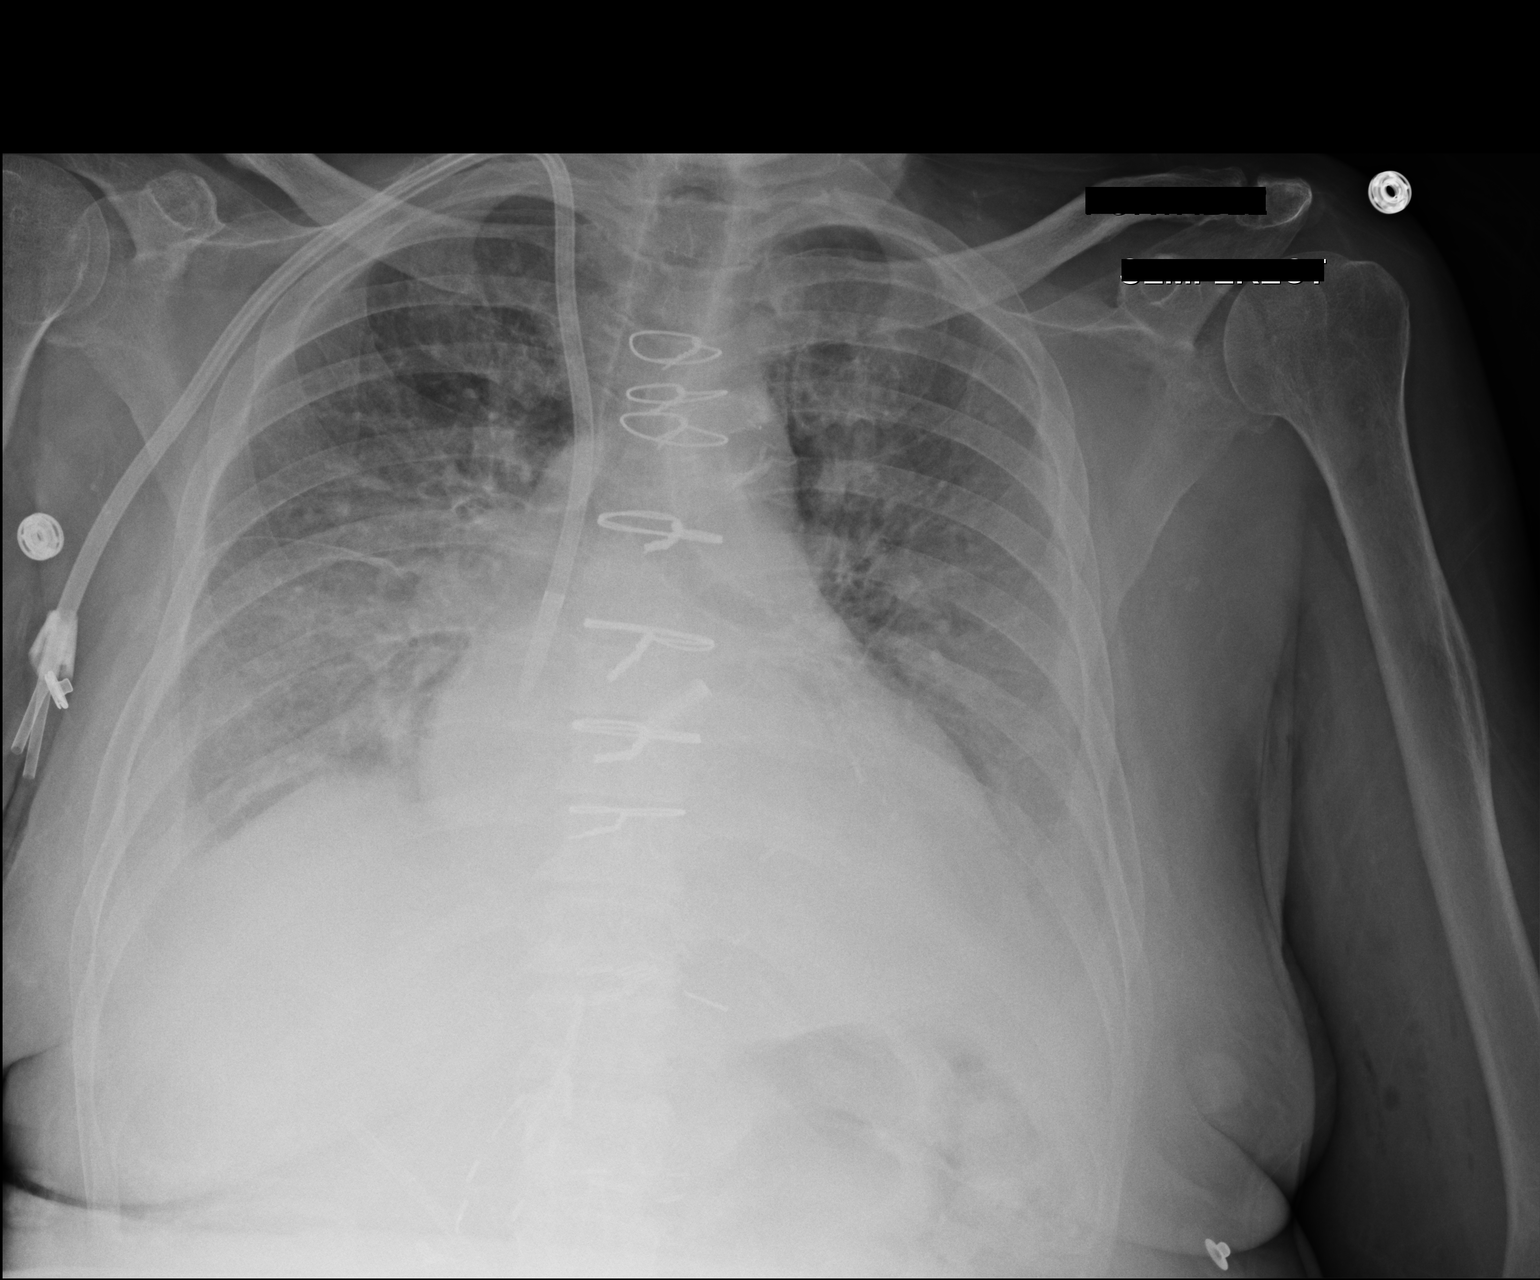

[1 of 1 positions shown; findings below may reference images not displayed]

FINDINGS: Cardiac shadow is enlarged. Postsurgical changes are again seen. A
new right jugular dialysis catheter is noted in satisfactory
position. Vascular congestion and bilateral effusions are again seen
likely related to volume overload. No focal confluent infiltrate is
seen.
IMPRESSION: Dialysis catheter in satisfactory position.

Changes of CHF and pleural effusions likely related to volume
overload

## 2018-11-09 IMAGING — DX DG CHEST 2V
2 series · 2 of 2 positions shown · non-contrast
Comparison: 07/17/2016 and prior radiographs

CLINICAL DATA: Shortness of breath.

EXAM:
CHEST  2 VIEW

[chest lat]
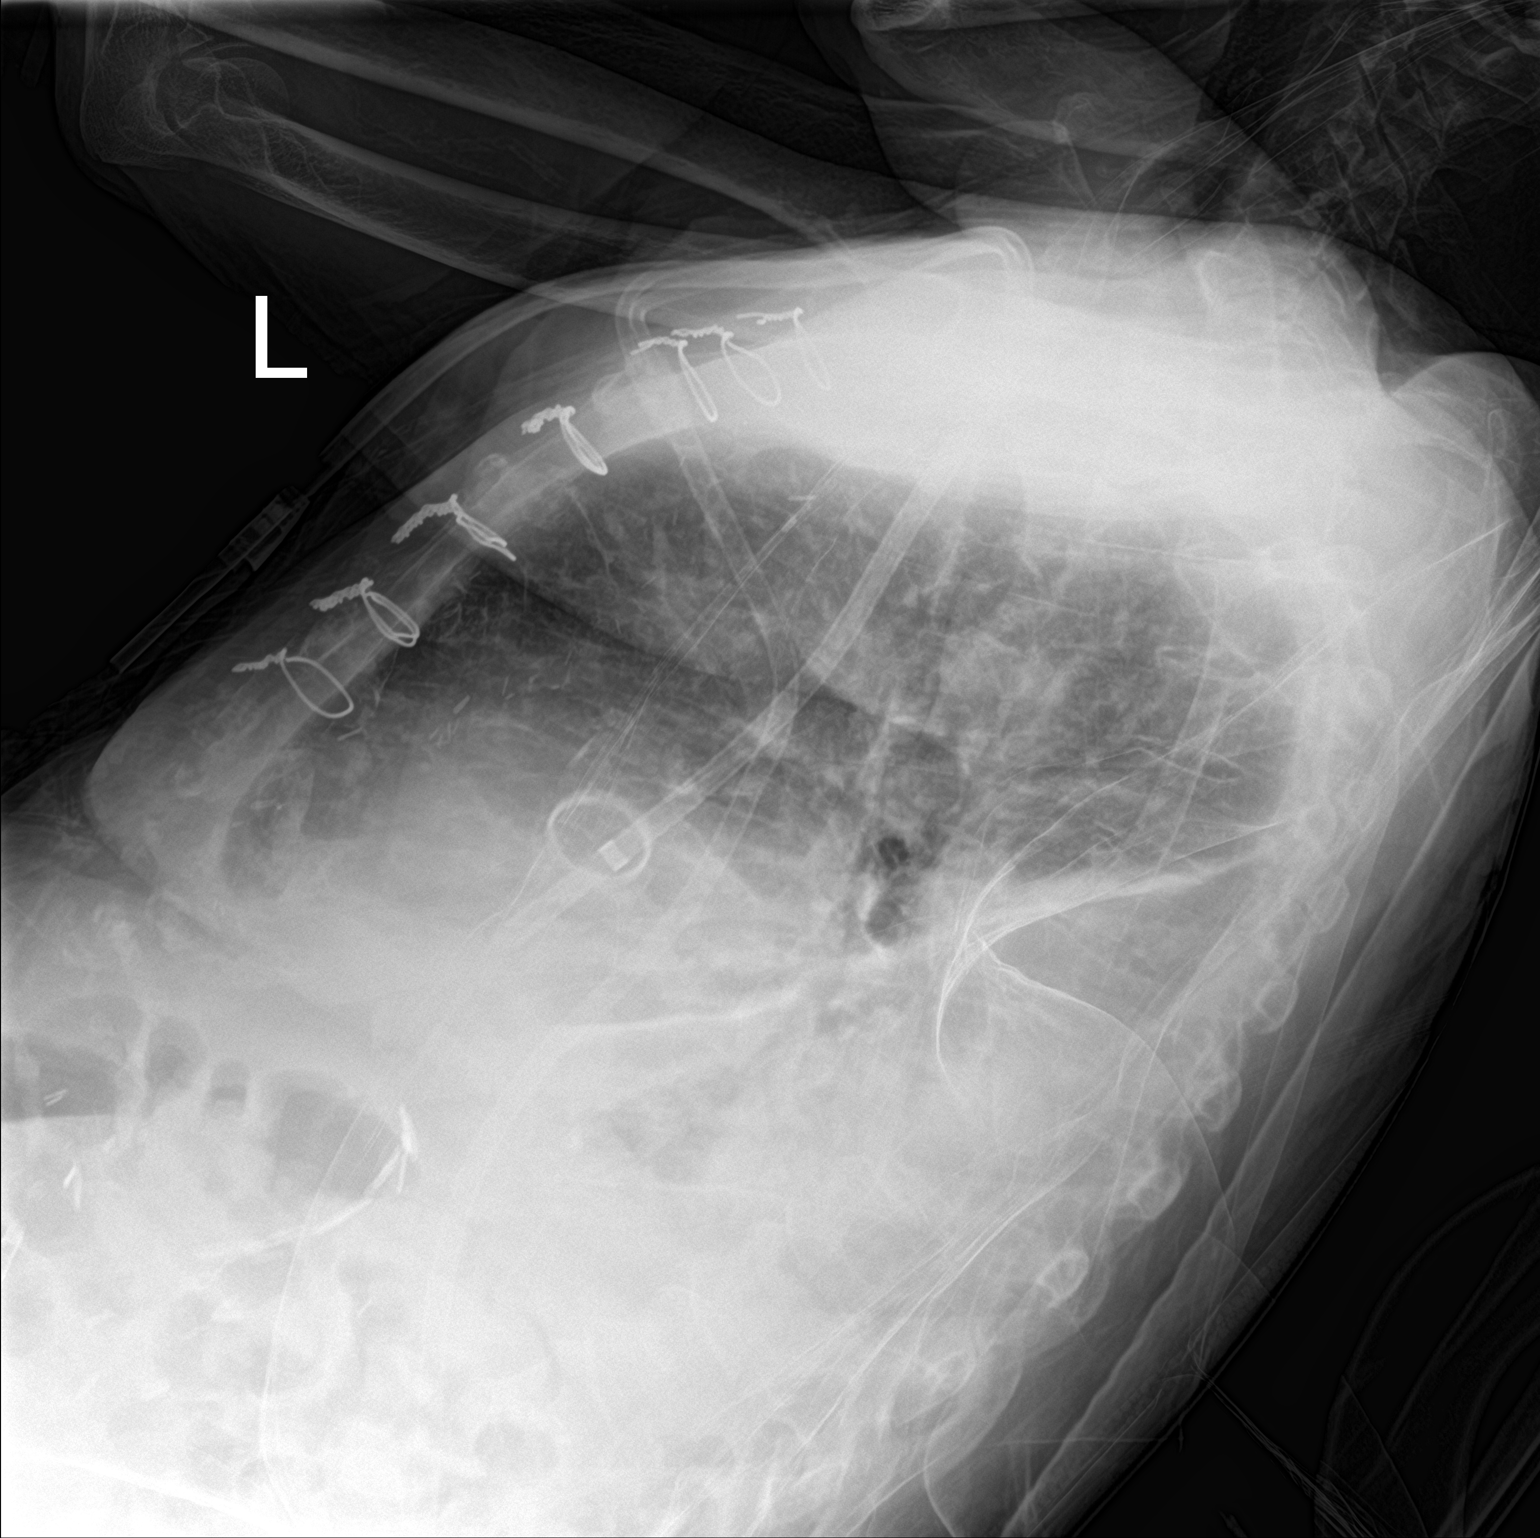

[chest ap]
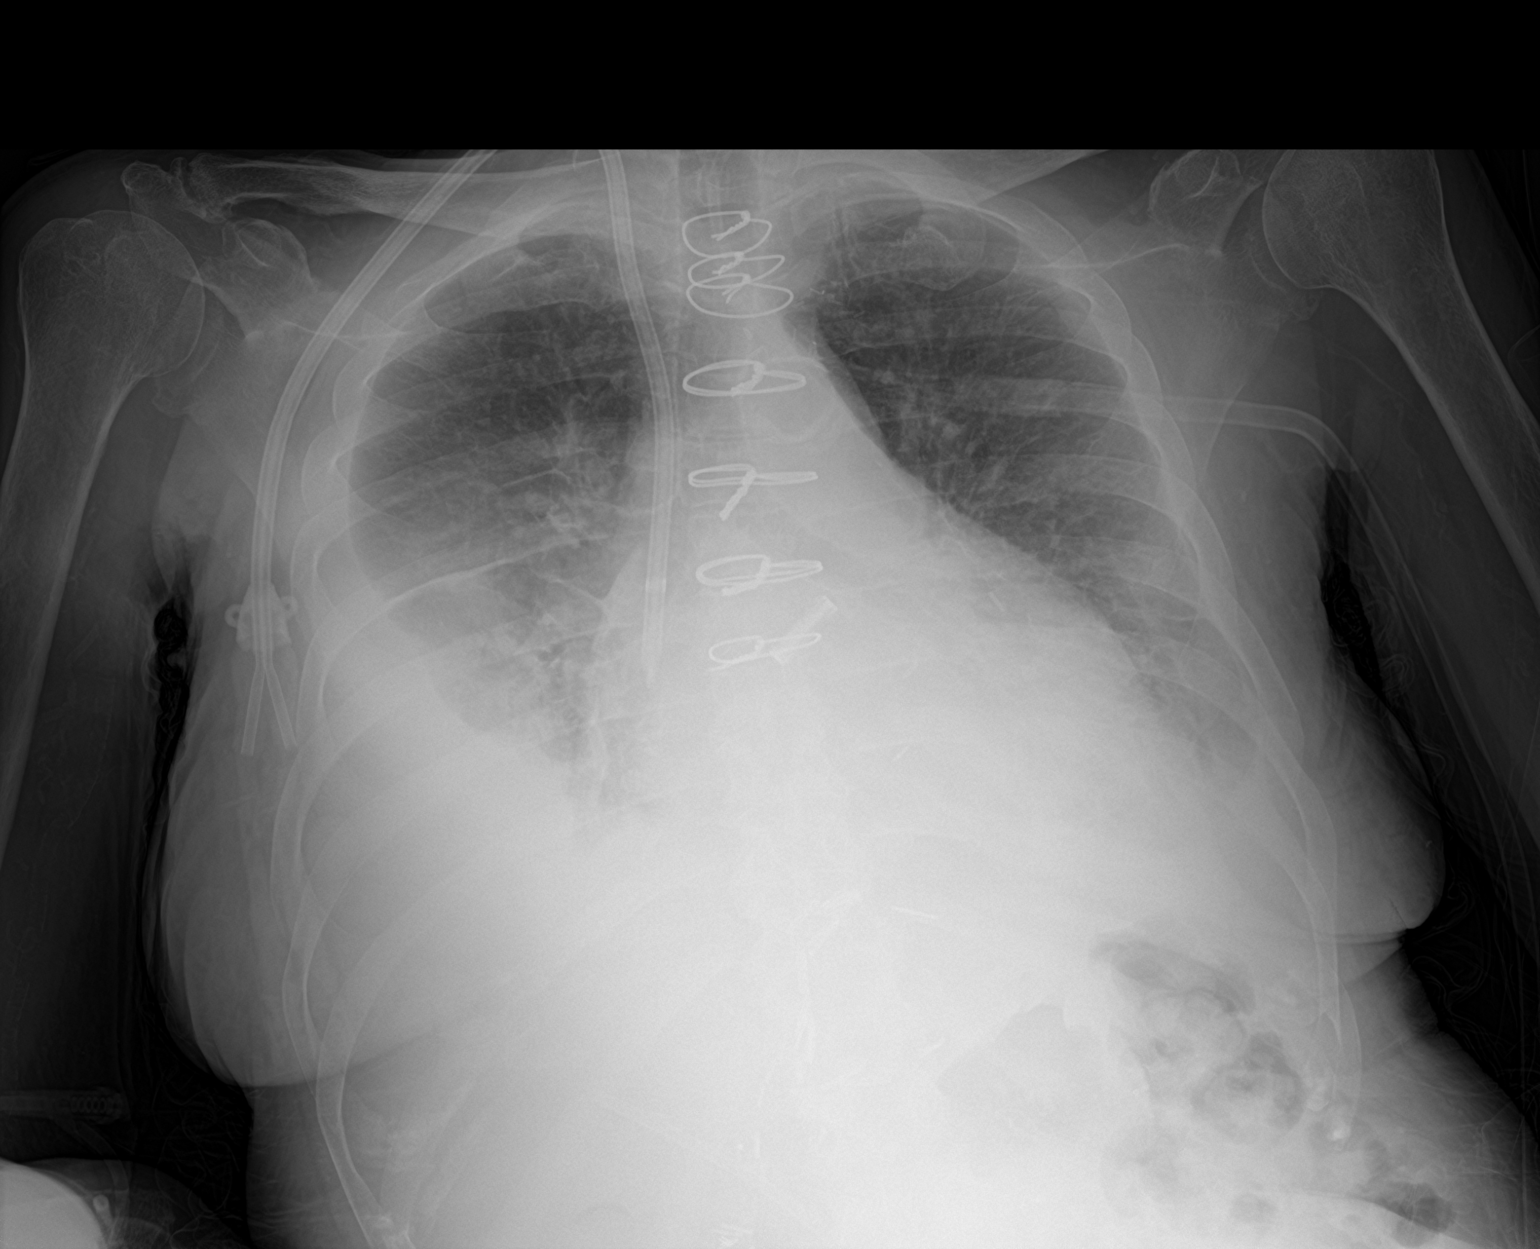

[2 of 2 positions shown; findings below may reference images not displayed]

FINDINGS: Cardiomegaly, aortic valve replacement and right IJ central venous
catheter with tip overlying the superior cavoatrial junction again
noted.

A moderate to large right pleural effusion appears slightly
increased in size.

A small left pleural effusion is again noted.

Mild interstitial opacities are again identified likely representing
mild interstitial edema.

Bilateral lower lung atelectasis again noted.

There is no evidence of pneumothorax.
IMPRESSION: Moderate to large right pleural effusion which appears slightly
increased since 07/17/2016.

No other significant changes. Small left pleural effusion, probable
mild interstitial pulmonary edema and bilateral lower lobe
atelectasis again noted.
